# Patient Record
Sex: Male | Born: 1950
Health system: Southern US, Community
[De-identification: ages and names within clinical notes are randomized; demographics above are authoritative.]

## PROBLEM LIST (undated history)

## (undated) DIAGNOSIS — I10 Essential (primary) hypertension: Secondary | ICD-10-CM

## (undated) DIAGNOSIS — F03B2 Unspecified dementia, moderate, with psychotic disturbance: Secondary | ICD-10-CM

## (undated) DIAGNOSIS — Z87442 Personal history of urinary calculi: Secondary | ICD-10-CM

## (undated) DIAGNOSIS — G20 Parkinson's disease: Secondary | ICD-10-CM

## (undated) DIAGNOSIS — K219 Gastro-esophageal reflux disease without esophagitis: Secondary | ICD-10-CM

## (undated) DIAGNOSIS — F329 Major depressive disorder, single episode, unspecified: Secondary | ICD-10-CM

## (undated) DIAGNOSIS — I351 Nonrheumatic aortic (valve) insufficiency: Secondary | ICD-10-CM

## (undated) DIAGNOSIS — Z9689 Presence of other specified functional implants: Secondary | ICD-10-CM

## (undated) DIAGNOSIS — F4310 Post-traumatic stress disorder, unspecified: Secondary | ICD-10-CM

## (undated) DIAGNOSIS — G4730 Sleep apnea, unspecified: Secondary | ICD-10-CM

## (undated) DIAGNOSIS — Z950 Presence of cardiac pacemaker: Secondary | ICD-10-CM

## (undated) DIAGNOSIS — R001 Bradycardia, unspecified: Secondary | ICD-10-CM

## (undated) DIAGNOSIS — R51 Headache: Secondary | ICD-10-CM

## (undated) DIAGNOSIS — M1990 Unspecified osteoarthritis, unspecified site: Secondary | ICD-10-CM

## (undated) DIAGNOSIS — Z8619 Personal history of other infectious and parasitic diseases: Secondary | ICD-10-CM

## (undated) DIAGNOSIS — F419 Anxiety disorder, unspecified: Secondary | ICD-10-CM

## (undated) DIAGNOSIS — C801 Malignant (primary) neoplasm, unspecified: Secondary | ICD-10-CM

## (undated) DIAGNOSIS — I8390 Asymptomatic varicose veins of unspecified lower extremity: Secondary | ICD-10-CM

## (undated) DIAGNOSIS — G20A1 Parkinson's disease without dyskinesia, without mention of fluctuations: Secondary | ICD-10-CM

## (undated) DIAGNOSIS — I499 Cardiac arrhythmia, unspecified: Secondary | ICD-10-CM

## (undated) DIAGNOSIS — F32A Depression, unspecified: Secondary | ICD-10-CM

## (undated) DIAGNOSIS — IMO0001 Reserved for inherently not codable concepts without codable children: Secondary | ICD-10-CM

## (undated) DIAGNOSIS — Z8679 Personal history of other diseases of the circulatory system: Secondary | ICD-10-CM

## (undated) HISTORY — DX: Gastro-esophageal reflux disease without esophagitis: K21.9

## (undated) HISTORY — DX: Personal history of other infectious and parasitic diseases: Z86.19

## (undated) HISTORY — DX: Presence of cardiac pacemaker: Z95.0

## (undated) HISTORY — PX: OTHER SURGICAL HISTORY: SHX169

---

## 2011-04-19 DIAGNOSIS — C801 Malignant (primary) neoplasm, unspecified: Secondary | ICD-10-CM

## 2011-04-19 HISTORY — DX: Malignant (primary) neoplasm, unspecified: C80.1

## 2012-10-22 ENCOUNTER — Emergency Department: Payer: Self-pay | Admitting: Emergency Medicine

## 2012-10-23 LAB — COMPREHENSIVE METABOLIC PANEL
Alkaline Phosphatase: 80 U/L (ref 50–136)
BUN: 33 mg/dL — ABNORMAL HIGH (ref 7–18)
Calcium, Total: 8.6 mg/dL (ref 8.5–10.1)
Chloride: 107 mmol/L (ref 98–107)
EGFR (African American): >60
Glucose: 136 mg/dL — ABNORMAL HIGH (ref 65–99)
Osmolality: 291 (ref 275–301)
Potassium: 3.4 mmol/L — ABNORMAL LOW (ref 3.5–5.1)
SGOT(AST): 21 U/L (ref 15–37)

## 2012-10-23 LAB — CBC WITH DIFFERENTIAL/PLATELET
Basophil #: 0 10*3/uL (ref 0.0–0.1)
HCT: 37.3 % — ABNORMAL LOW (ref 40.0–52.0)
Lymphocyte #: 2.4 10*3/uL (ref 1.0–3.6)
MCH: 31.3 pg (ref 26.0–34.0)
MCHC: 34.4 g/dL (ref 32.0–36.0)
Monocyte #: 0.4 x10 3/mm (ref 0.2–1.0)
Monocyte %: 6.3 %
Neutrophil #: 4.1 10*3/uL (ref 1.4–6.5)
Neutrophil %: 58.5 %
Platelet: 176 10*3/uL (ref 150–440)
RDW: 14.1 % (ref 11.5–14.5)
WBC: 7.1 10*3/uL (ref 3.8–10.6)

## 2012-10-23 LAB — URINALYSIS, COMPLETE
Bacteria: NONE SEEN
Bilirubin,UR: NEGATIVE
Blood: NEGATIVE
Hyaline Cast: 1
Nitrite: NEGATIVE
Ph: 5 (ref 4.5–8.0)
Protein: NEGATIVE
WBC UR: 5 /HPF (ref 0–5)

## 2012-10-23 LAB — CK TOTAL AND CKMB (NOT AT ARMC)
CK, Total: 258 U/L — ABNORMAL HIGH (ref 35–232)
CK-MB: 4.7 ng/mL — ABNORMAL HIGH (ref 0.5–3.6)

## 2012-10-23 LAB — MAGNESIUM: Magnesium: 2 mg/dL

## 2012-10-23 LAB — TROPONIN I: Troponin-I: <0.02 ng/mL

## 2012-11-23 ENCOUNTER — Other Ambulatory Visit: Payer: Self-pay | Admitting: Neurosurgery

## 2012-12-06 ENCOUNTER — Encounter (HOSPITAL_COMMUNITY): Payer: Self-pay

## 2012-12-06 ENCOUNTER — Encounter (HOSPITAL_COMMUNITY)
Admission: RE | Admit: 2012-12-06 | Discharge: 2012-12-06 | Disposition: A | Discharge status: Home / Self Care | Payer: Medicare PPO | Source: Ambulatory Visit | Attending: Neurosurgery | Admitting: Neurosurgery

## 2012-12-06 DIAGNOSIS — Z01812 Encounter for preprocedural laboratory examination: Secondary | ICD-10-CM | POA: Insufficient documentation

## 2012-12-06 HISTORY — DX: Headache: R51

## 2012-12-06 HISTORY — DX: Post-traumatic stress disorder, unspecified: F43.10

## 2012-12-06 HISTORY — DX: Depression, unspecified: F32.A

## 2012-12-06 HISTORY — DX: Parkinson's disease without dyskinesia, without mention of fluctuations: G20.A1

## 2012-12-06 HISTORY — DX: Parkinson's disease: G20

## 2012-12-06 HISTORY — DX: Major depressive disorder, single episode, unspecified: F32.9

## 2012-12-06 HISTORY — DX: Sleep apnea, unspecified: G47.30

## 2012-12-06 HISTORY — DX: Bradycardia, unspecified: R00.1

## 2012-12-06 HISTORY — DX: Essential (primary) hypertension: I10

## 2012-12-06 HISTORY — DX: Asymptomatic varicose veins of unspecified lower extremity: I83.90

## 2012-12-06 HISTORY — DX: Malignant (primary) neoplasm, unspecified: C80.1

## 2012-12-06 HISTORY — DX: Unspecified osteoarthritis, unspecified site: M19.90

## 2012-12-06 LAB — CBC WITH DIFFERENTIAL/PLATELET
Basophils Relative: 1 % (ref 0–1)
Eosinophils Absolute: 0.1 10*3/uL (ref 0.0–0.7)
MCH: 31.7 pg (ref 26.0–34.0)
MCHC: 35.8 g/dL (ref 30.0–36.0)
Monocytes Relative: 9 % (ref 3–12)
Neutrophils Relative %: 61 % (ref 43–77)
Platelets: 197 10*3/uL (ref 150–400)
RDW: 13.3 % (ref 11.5–15.5)

## 2012-12-06 LAB — BASIC METABOLIC PANEL
BUN: 30 mg/dL — ABNORMAL HIGH (ref 6–23)
Calcium: 9.2 mg/dL (ref 8.4–10.5)
Creatinine, Ser: 0.66 mg/dL (ref 0.50–1.35)
GFR calc non Af Amer: >90 mL/min (ref >90)
Glucose, Bld: 103 mg/dL — ABNORMAL HIGH (ref 70–99)

## 2012-12-06 NOTE — Pre-Procedure Instructions (Signed)
Mikhi Athey Ohlsen  12/06/2012   Your procedure is scheduled on:  December 14, 2012 at 7:30 AM  Report to [facility] at 5:30 AM.  Call this number if you have problems the morning of surgery: [phone]   Remember:   Do not eat food or drink liquids after midnight.   Take these medicines the morning of surgery with A SIP OF WATER: Gabapentin, Percocet, Seligizine, Premapaxil, Carbidopa-Levadopa. Stop all Vitamins, Non-steroidals (Naproxen, Aleve, Motrin, Ibuprofen), herbal medications and aspirin.   Do not wear jewelry, make-up or nail polish.  Do not wear lotions, powders, or perfumes. You may wear deodorant.  Do not shave 48 hours prior to surgery. Men may shave face and neck.  Do not bring valuables to the hospital.  [facility] is not responsible                   for any belongings or valuables.  Contacts, dentures or bridgework may not be worn into surgery.  Leave suitcase in the car. After surgery it may be brought to your room.  For patients admitted to the hospital, checkout time is 11:00 AM the day of  discharge.     Special Instructions: Shower using CHG 2 nights before surgery and the night before surgery.  If you shower the day of surgery use CHG.  Use special wash - you have one bottle of CHG for all showers.  You should use approximately 1/3 of the bottle for each shower.   Please read over the following fact sheets that you were given: Pain Booklet, Coughing and Deep Breathing, MRSA Information and Surgical Site Infection Prevention

## 2012-12-10 ENCOUNTER — Encounter (HOSPITAL_COMMUNITY): Payer: Self-pay | Admitting: Pharmacist

## 2012-12-13 MED ORDER — CEFAZOLIN SODIUM-DEXTROSE 2-3 GM-% IV SOLR
2.0000 g | INTRAVENOUS | Status: AC
  Administered 2012-12-14: 2 g via INTRAVENOUS
  Filled 2012-12-13: qty 50

## 2012-12-14 ENCOUNTER — Encounter (HOSPITAL_COMMUNITY): Payer: Self-pay | Admitting: *Deleted

## 2012-12-14 ENCOUNTER — Ambulatory Visit (HOSPITAL_COMMUNITY)
Admission: RE | Admit: 2012-12-14 | Discharge: 2012-12-14 | Disposition: A | Discharge status: Home / Self Care | Payer: Medicare PPO | Source: Ambulatory Visit | Attending: Neurosurgery | Admitting: Neurosurgery

## 2012-12-14 ENCOUNTER — Encounter (HOSPITAL_COMMUNITY): Payer: Self-pay | Admitting: Certified Registered"

## 2012-12-14 ENCOUNTER — Ambulatory Visit (HOSPITAL_COMMUNITY): Payer: Medicare PPO | Admitting: Certified Registered"

## 2012-12-14 ENCOUNTER — Ambulatory Visit (HOSPITAL_COMMUNITY): Payer: Medicare PPO

## 2012-12-14 ENCOUNTER — Encounter (HOSPITAL_COMMUNITY)
Admission: RE | Disposition: A | Discharge status: Home / Self Care | Payer: Self-pay | Source: Ambulatory Visit | Attending: Neurosurgery

## 2012-12-14 DIAGNOSIS — I10 Essential (primary) hypertension: Secondary | ICD-10-CM | POA: Diagnosis not present

## 2012-12-14 DIAGNOSIS — M129 Arthropathy, unspecified: Secondary | ICD-10-CM | POA: Diagnosis not present

## 2012-12-14 DIAGNOSIS — N200 Calculus of kidney: Secondary | ICD-10-CM | POA: Insufficient documentation

## 2012-12-14 DIAGNOSIS — Z79899 Other long term (current) drug therapy: Secondary | ICD-10-CM | POA: Insufficient documentation

## 2012-12-14 DIAGNOSIS — M47817 Spondylosis without myelopathy or radiculopathy, lumbosacral region: Secondary | ICD-10-CM | POA: Insufficient documentation

## 2012-12-14 DIAGNOSIS — M48062 Spinal stenosis, lumbar region with neurogenic claudication: Secondary | ICD-10-CM | POA: Diagnosis present

## 2012-12-14 DIAGNOSIS — Z85828 Personal history of other malignant neoplasm of skin: Secondary | ICD-10-CM | POA: Diagnosis not present

## 2012-12-14 DIAGNOSIS — F4310 Post-traumatic stress disorder, unspecified: Secondary | ICD-10-CM | POA: Insufficient documentation

## 2012-12-14 DIAGNOSIS — I8390 Asymptomatic varicose veins of unspecified lower extremity: Secondary | ICD-10-CM | POA: Diagnosis not present

## 2012-12-14 DIAGNOSIS — G20 Parkinson's disease: Secondary | ICD-10-CM | POA: Diagnosis not present

## 2012-12-14 DIAGNOSIS — G20A1 Parkinson's disease without dyskinesia, without mention of fluctuations: Secondary | ICD-10-CM | POA: Insufficient documentation

## 2012-12-14 DIAGNOSIS — G4730 Sleep apnea, unspecified: Secondary | ICD-10-CM | POA: Diagnosis not present

## 2012-12-14 HISTORY — PX: LUMBAR LAMINECTOMY/DECOMPRESSION MICRODISCECTOMY: SHX5026

## 2012-12-14 SURGERY — LUMBAR LAMINECTOMY/DECOMPRESSION MICRODISCECTOMY 2 LEVELS
Anesthesia: General | Site: Back | Laterality: Bilateral | Wound class: Clean

## 2012-12-14 MED ORDER — GLYCOPYRROLATE 0.2 MG/ML IJ SOLN
INTRAMUSCULAR | Status: DC | PRN
  Administered 2012-12-14: .5 mg via INTRAVENOUS

## 2012-12-14 MED ORDER — ALUM & MAG HYDROXIDE-SIMETH 200-200-20 MG/5ML PO SUSP: 30.0000 mL | Freq: Four times a day (QID) | ORAL | Status: DC | PRN

## 2012-12-14 MED ORDER — MENTHOL 3 MG MT LOZG: 1.0000 | LOZENGE | OROMUCOSAL | Status: DC | PRN

## 2012-12-14 MED ORDER — LIDOCAINE HCL (CARDIAC) 20 MG/ML IV SOLN
INTRAVENOUS | Status: DC | PRN
  Administered 2012-12-14: 100 mg via INTRAVENOUS

## 2012-12-14 MED ORDER — HYDROMORPHONE HCL PF 1 MG/ML IJ SOLN: 0.5000 mg | INTRAMUSCULAR | Status: DC | PRN

## 2012-12-14 MED ORDER — MEPERIDINE HCL 25 MG/ML IJ SOLN: 6.2500 mg | INTRAMUSCULAR | Status: DC | PRN

## 2012-12-14 MED ORDER — KETOROLAC TROMETHAMINE 30 MG/ML IJ SOLN: 30.0000 mg | Freq: Once | INTRAMUSCULAR | Status: DC

## 2012-12-14 MED ORDER — LACTATED RINGERS IV SOLN
INTRAVENOUS | Status: DC | PRN
  Administered 2012-12-14 (×2): via INTRAVENOUS

## 2012-12-14 MED ORDER — OXYCODONE HCL 5 MG/5ML PO SOLN: 5.0000 mg | Freq: Once | ORAL | Status: DC | PRN

## 2012-12-14 MED ORDER — PHENOL 1.4 % MT LIQD: 1.0000 | OROMUCOSAL | Status: DC | PRN

## 2012-12-14 MED ORDER — GABAPENTIN 300 MG PO CAPS
600.0000 mg | ORAL_CAPSULE | Freq: Three times a day (TID) | ORAL | Status: DC
Start: 2012-12-14 — End: 2012-12-14
  Administered 2012-12-14: 600 mg via ORAL
  Filled 2012-12-14 (×2): qty 2

## 2012-12-14 MED ORDER — VITAMIN D3 25 MCG (1000 UNIT) PO TABS
1000.0000 [IU] | ORAL_TABLET | Freq: Every day | ORAL | Status: DC
Start: 2012-12-14 — End: 2012-12-14
  Administered 2012-12-14: 1000 [IU] via ORAL
  Filled 2012-12-14: qty 1

## 2012-12-14 MED ORDER — HYDROMORPHONE HCL PF 1 MG/ML IJ SOLN
INTRAMUSCULAR | Status: AC
  Filled 2012-12-14: qty 1

## 2012-12-14 MED ORDER — OXYCODONE HCL 5 MG PO TABS: 5.0000 mg | ORAL_TABLET | Freq: Once | ORAL | Status: DC | PRN

## 2012-12-14 MED ORDER — ACETAMINOPHEN 650 MG RE SUPP: 650.0000 mg | RECTAL | Status: DC | PRN

## 2012-12-14 MED ORDER — ACETAMINOPHEN 325 MG PO TABS: 650.0000 mg | ORAL_TABLET | ORAL | Status: DC | PRN

## 2012-12-14 MED ORDER — SODIUM CHLORIDE 0.9 % IJ SOLN: 3.0000 mL | INTRAMUSCULAR | Status: DC | PRN

## 2012-12-14 MED ORDER — BUPIVACAINE HCL (PF) 0.25 % IJ SOLN
INTRAMUSCULAR | Status: DC | PRN
  Administered 2012-12-14: 20 mL

## 2012-12-14 MED ORDER — CYCLOBENZAPRINE HCL 10 MG PO TABS: 10.0000 mg | ORAL_TABLET | Freq: Three times a day (TID) | ORAL | Status: DC | PRN

## 2012-12-14 MED ORDER — SODIUM CHLORIDE 0.9 % IR SOLN
Status: DC | PRN
  Administered 2012-12-14: 08:00:00

## 2012-12-14 MED ORDER — THROMBIN 5000 UNITS EX SOLR
CUTANEOUS | Status: DC | PRN
  Administered 2012-12-14 (×2): 5000 [IU] via TOPICAL

## 2012-12-14 MED ORDER — ONDANSETRON HCL 4 MG/2ML IJ SOLN: 4.0000 mg | INTRAMUSCULAR | Status: DC | PRN

## 2012-12-14 MED ORDER — SENNA 8.6 MG PO TABS: 1.0000 | ORAL_TABLET | Freq: Two times a day (BID) | ORAL | Status: DC

## 2012-12-14 MED ORDER — ZOLPIDEM TARTRATE 5 MG PO TABS: 5.0000 mg | ORAL_TABLET | Freq: Every evening | ORAL | Status: DC | PRN

## 2012-12-14 MED ORDER — CEFAZOLIN SODIUM 1-5 GM-% IV SOLN
1.0000 g | Freq: Three times a day (TID) | INTRAVENOUS | Status: DC
  Administered 2012-12-14: 1 g via INTRAVENOUS
  Filled 2012-12-14 (×2): qty 50

## 2012-12-14 MED ORDER — OXYCODONE-ACETAMINOPHEN 5-325 MG PO TABS
1.0000 | ORAL_TABLET | ORAL | Status: DC | PRN
  Administered 2012-12-14 (×2): 2 via ORAL
  Filled 2012-12-14 (×2): qty 2

## 2012-12-14 MED ORDER — KETOROLAC TROMETHAMINE 30 MG/ML IJ SOLN
INTRAMUSCULAR | Status: AC
  Filled 2012-12-14: qty 1

## 2012-12-14 MED ORDER — 0.9 % SODIUM CHLORIDE (POUR BTL) OPTIME
TOPICAL | Status: DC | PRN
  Administered 2012-12-14: 1000 mL

## 2012-12-14 MED ORDER — ONDANSETRON HCL 4 MG/2ML IJ SOLN
4.0000 mg | Freq: Once | INTRAMUSCULAR | Status: DC | PRN
Start: 2012-12-14 — End: 2012-12-14

## 2012-12-14 MED ORDER — PROPOFOL 10 MG/ML IV BOLUS
INTRAVENOUS | Status: DC | PRN
  Administered 2012-12-14: 100 mg via INTRAVENOUS

## 2012-12-14 MED ORDER — CARBIDOPA-LEVODOPA 25-100 MG PO TABS
3.0000 | ORAL_TABLET | Freq: Three times a day (TID) | ORAL | Status: DC
  Administered 2012-12-14: 3 via ORAL
  Filled 2012-12-14 (×3): qty 3

## 2012-12-14 MED ORDER — DEXAMETHASONE SODIUM PHOSPHATE 10 MG/ML IJ SOLN
INTRAMUSCULAR | Status: AC
  Filled 2012-12-14: qty 1

## 2012-12-14 MED ORDER — HEMOSTATIC AGENTS (NO CHARGE) OPTIME
TOPICAL | Status: DC | PRN
  Administered 2012-12-14: 1 via TOPICAL

## 2012-12-14 MED ORDER — CYCLOBENZAPRINE HCL 10 MG PO TABS
10.0000 mg | ORAL_TABLET | Freq: Three times a day (TID) | ORAL | Status: DC | PRN
  Administered 2012-12-14: 10 mg via ORAL
  Filled 2012-12-14: qty 1

## 2012-12-14 MED ORDER — ROCURONIUM BROMIDE 100 MG/10ML IV SOLN
INTRAVENOUS | Status: DC | PRN
  Administered 2012-12-14: 10 mg via INTRAVENOUS
  Administered 2012-12-14: 50 mg via INTRAVENOUS

## 2012-12-14 MED ORDER — SELEGILINE HCL 5 MG PO CAPS
5.0000 mg | ORAL_CAPSULE | Freq: Every day | ORAL | Status: DC
  Filled 2012-12-14: qty 1

## 2012-12-14 MED ORDER — SUFENTANIL CITRATE 50 MCG/ML IV SOLN
INTRAVENOUS | Status: DC | PRN
  Administered 2012-12-14: 10 ug via INTRAVENOUS
  Administered 2012-12-14: 20 ug via INTRAVENOUS

## 2012-12-14 MED ORDER — OXYCODONE-ACETAMINOPHEN 5-325 MG PO TABS: 2.0000 | ORAL_TABLET | Freq: Every day | ORAL | Status: DC

## 2012-12-14 MED ORDER — HYDROMORPHONE HCL PF 1 MG/ML IJ SOLN
0.2500 mg | INTRAMUSCULAR | Status: DC | PRN
  Administered 2012-12-14 (×3): 0.5 mg via INTRAVENOUS

## 2012-12-14 MED ORDER — DEXAMETHASONE SODIUM PHOSPHATE 10 MG/ML IJ SOLN
10.0000 mg | INTRAMUSCULAR | Status: AC
  Administered 2012-12-14: 10 mg via INTRAVENOUS

## 2012-12-14 MED ORDER — ONDANSETRON HCL 4 MG/2ML IJ SOLN
INTRAMUSCULAR | Status: DC | PRN
  Administered 2012-12-14: 4 mg via INTRAVENOUS

## 2012-12-14 MED ORDER — HYDROCODONE-ACETAMINOPHEN 5-325 MG PO TABS: 1.0000 | ORAL_TABLET | ORAL | Status: DC | PRN

## 2012-12-14 MED ORDER — HYDROCHLOROTHIAZIDE 25 MG PO TABS
25.0000 mg | ORAL_TABLET | Freq: Every day | ORAL | Status: DC
  Administered 2012-12-14: 25 mg via ORAL
  Filled 2012-12-14: qty 1

## 2012-12-14 MED ORDER — OXYCODONE-ACETAMINOPHEN 5-325 MG PO TABS: 1.0000 | ORAL_TABLET | Freq: Every day | ORAL | Status: DC

## 2012-12-14 MED ORDER — MIDAZOLAM HCL 5 MG/5ML IJ SOLN
INTRAMUSCULAR | Status: DC | PRN
  Administered 2012-12-14: 2 mg via INTRAVENOUS

## 2012-12-14 MED ORDER — KETOROLAC TROMETHAMINE 30 MG/ML IJ SOLN
30.0000 mg | Freq: Four times a day (QID) | INTRAMUSCULAR | Status: DC
  Administered 2012-12-14 (×2): 30 mg via INTRAVENOUS

## 2012-12-14 MED ORDER — SODIUM CHLORIDE 0.9 % IJ SOLN
3.0000 mL | Freq: Two times a day (BID) | INTRAMUSCULAR | Status: DC
  Administered 2012-12-14: 3 mL via INTRAVENOUS

## 2012-12-14 MED ORDER — PRAMIPEXOLE DIHYDROCHLORIDE 0.25 MG PO TABS
0.5000 mg | ORAL_TABLET | Freq: Three times a day (TID) | ORAL | Status: DC
  Administered 2012-12-14: 0.5 mg via ORAL
  Filled 2012-12-14 (×3): qty 2

## 2012-12-14 MED ORDER — NEOSTIGMINE METHYLSULFATE 1 MG/ML IJ SOLN
INTRAMUSCULAR | Status: DC | PRN
  Administered 2012-12-14: 3 mg via INTRAVENOUS

## 2012-12-14 SURGICAL SUPPLY — 54 items
BAG DECANTER FOR FLEXI CONT (MISCELLANEOUS) ×2 IMPLANT
BENZOIN TINCTURE PRP APPL 2/3 (GAUZE/BANDAGES/DRESSINGS) ×2 IMPLANT
BLADE SURG ROTATE 9660 (MISCELLANEOUS) IMPLANT
BRUSH SCRUB EZ PLAIN DRY (MISCELLANEOUS) ×2 IMPLANT
BUR CUTTER 7.0 ROUND (BURR) ×2 IMPLANT
CANISTER SUCTION 2500CC (MISCELLANEOUS) ×2 IMPLANT
CLOTH BEACON ORANGE TIMEOUT ST (SAFETY) ×2 IMPLANT
CONT SPEC 4OZ CLIKSEAL STRL BL (MISCELLANEOUS) ×2 IMPLANT
DECANTER SPIKE VIAL GLASS SM (MISCELLANEOUS) ×2 IMPLANT
DERMABOND ADVANCED (GAUZE/BANDAGES/DRESSINGS) ×1
DERMABOND ADVANCED .7 DNX12 (GAUZE/BANDAGES/DRESSINGS) ×1 IMPLANT
DRAPE LAPAROTOMY 100X72X124 (DRAPES) ×2 IMPLANT
DRAPE MICROSCOPE ZEISS OPMI (DRAPES) ×2 IMPLANT
DRAPE POUCH INSTRU U-SHP 10X18 (DRAPES) ×2 IMPLANT
DRAPE PROXIMA HALF (DRAPES) IMPLANT
DRAPE SURG 17X23 STRL (DRAPES) ×4 IMPLANT
DURAPREP 26ML APPLICATOR (WOUND CARE) ×2 IMPLANT
ELECT REM PT RETURN 9FT ADLT (ELECTROSURGICAL) ×2
ELECTRODE REM PT RTRN 9FT ADLT (ELECTROSURGICAL) ×1 IMPLANT
EVACUATOR 1/8 PVC DRAIN (DRAIN) ×2 IMPLANT
GAUZE SPONGE 4X4 16PLY XRAY LF (GAUZE/BANDAGES/DRESSINGS) IMPLANT
GLOVE BIOGEL PI IND STRL 7.0 (GLOVE) ×2 IMPLANT
GLOVE BIOGEL PI IND STRL 8 (GLOVE) ×1 IMPLANT
GLOVE BIOGEL PI INDICATOR 7.0 (GLOVE) ×2
GLOVE BIOGEL PI INDICATOR 8 (GLOVE) ×1
GLOVE ECLIPSE 7.5 STRL STRAW (GLOVE) ×2 IMPLANT
GLOVE ECLIPSE 8.5 STRL (GLOVE) ×4 IMPLANT
GLOVE EXAM NITRILE LRG STRL (GLOVE) IMPLANT
GLOVE EXAM NITRILE MD LF STRL (GLOVE) IMPLANT
GLOVE EXAM NITRILE XL STR (GLOVE) IMPLANT
GLOVE EXAM NITRILE XS STR PU (GLOVE) IMPLANT
GLOVE SS BIOGEL STRL SZ 6.5 (GLOVE) ×2 IMPLANT
GLOVE SUPERSENSE BIOGEL SZ 6.5 (GLOVE) ×2
GOWN BRE IMP SLV AUR LG STRL (GOWN DISPOSABLE) ×4 IMPLANT
GOWN BRE IMP SLV AUR XL STRL (GOWN DISPOSABLE) ×2 IMPLANT
GOWN STRL REIN 2XL LVL4 (GOWN DISPOSABLE) IMPLANT
KIT BASIN OR (CUSTOM PROCEDURE TRAY) ×2 IMPLANT
KIT ROOM TURNOVER OR (KITS) ×2 IMPLANT
NEEDLE HYPO 22GX1.5 SAFETY (NEEDLE) ×2 IMPLANT
NEEDLE SPNL 22GX3.5 QUINCKE BK (NEEDLE) ×2 IMPLANT
NS IRRIG 1000ML POUR BTL (IV SOLUTION) ×2 IMPLANT
PACK LAMINECTOMY NEURO (CUSTOM PROCEDURE TRAY) ×2 IMPLANT
PAD ARMBOARD 7.5X6 YLW CONV (MISCELLANEOUS) ×6 IMPLANT
RUBBERBAND STERILE (MISCELLANEOUS) ×4 IMPLANT
SPONGE GAUZE 4X4 12PLY (GAUZE/BANDAGES/DRESSINGS) ×2 IMPLANT
SPONGE SURGIFOAM ABS GEL SZ50 (HEMOSTASIS) ×2 IMPLANT
STRIP CLOSURE SKIN 1/2X4 (GAUZE/BANDAGES/DRESSINGS) ×2 IMPLANT
SUT VIC AB 2-0 CT1 18 (SUTURE) ×2 IMPLANT
SUT VIC AB 3-0 SH 8-18 (SUTURE) ×2 IMPLANT
SYR 20ML ECCENTRIC (SYRINGE) ×2 IMPLANT
TAPE CLOTH SURG 4X10 WHT LF (GAUZE/BANDAGES/DRESSINGS) ×2 IMPLANT
TOWEL OR 17X24 6PK STRL BLUE (TOWEL DISPOSABLE) ×2 IMPLANT
TOWEL OR 17X26 10 PK STRL BLUE (TOWEL DISPOSABLE) ×2 IMPLANT
WATER STERILE IRR 1000ML POUR (IV SOLUTION) ×2 IMPLANT

## 2012-12-14 NOTE — Op Note (Signed)
Date of procedure: 12/14/2012  Date of dictation: Same  Service: Neurosurgery  Preoperative diagnosis: L3-4, L4-5 stenosis with neurogenic claudication affecting the bilateral L3-L4 and L5 nerve roots.  Postoperative diagnosis: Same  Procedure Name: Bilateral L3-4 and L4-5 decompressive laminotomies with bilateral L3, L4, L5 decompressive foraminotomies.  Surgeon:Jessye Imhoff A.Jayce Kainz, M.D.  Asst. Surgeon: Phoebe Perch  Anesthesia: General  Indication: 62 year old male with severe lumbar stenosis and bilateral lower extremity symptoms consistent with neurogenic claudication. Workup demonstrates evidence of marked spondylosis and stenosis at L3-4 L4-5 with severe compression of the thecal sac and exiting L3-L4 and L5 nerve roots. Patient presents now for bilateral decompressive surgery in hopes of improving his symptoms.  Operative note: After induction anesthesia, patient positioned prone onto Wilson frame and appropriately padded. Lumbar region prepped and draped. Incision made overlying the L3-4-5 level. Dissection performed bilaterally. Retractor placed. X-ray taken. Levels confirmed. Laminotomies performed using high-speed drill and Kerrison rongeurs at L3-4 L4-5 bilaterally. Underlying ligament flavum was elevated and resected piecemeal fashion. Wide decompressive laminotomies and foraminotomies along the course the exiting L3-L4 and L5 nerve roots were performed bilaterally. At this point a very thorough decompression achieved. The disc spaces were inspected at both levels bilaterally and found to be without significant herniation. Wounds that area doing a bike solution. Gelfoam was placed topically for hemostasis. Medium Hemovac drain was left in the epidural space. Wounds and close in layers with Vicryl sutures. Steri-Strips and sterile dressing were applied. There were no apparent complications. Patient tolerated the procedure well and he returns to the recovery postop.

## 2012-12-14 NOTE — Brief Op Note (Signed)
12/14/2012  9:47 AM  PATIENT:  [name]  63 y.o. male  PRE-OPERATIVE DIAGNOSIS:  stenosis  POST-OPERATIVE DIAGNOSIS:  stenosis  PROCEDURE:  Procedure(s): Bilateral lumbar three-four, four-five decompressive laminotomy/foraminotomy (Bilateral)  SURGEON:  Surgeon(s) and Role:    * Temple Pacini, MD - Primary    * Clydene Fake, MD - Assisting  PHYSICIAN ASSISTANT:   ASSISTANTS:    ANESTHESIA:   general  EBL:  Total I/O In: 1400 [I.V.:1400] Out: 50 [Blood:50]  BLOOD ADMINISTERED:none  DRAINS: (Medium) Hemovact drain(s) in the Epidural space with  Suction Open   LOCAL MEDICATIONS USED:  MARCAINE     SPECIMEN:  No Specimen  DISPOSITION OF SPECIMEN:  N/A  COUNTS:  YES  TOURNIQUET:  * No tourniquets in log *  DICTATION: .Dragon Dictation  PLAN OF CARE: Admit for overnight observation  PATIENT DISPOSITION:  PACU - hemodynamically stable.   Delay start of Pharmacological VTE agent (>24hrs) due to surgical blood loss or risk of bleeding: yes

## 2012-12-14 NOTE — Transfer of Care (Signed)
Immediate Anesthesia Transfer of Care Note  Patient: [name]  Procedure(s) Performed: Procedure(s): Bilateral lumbar three-four, four-five decompressive laminotomy/foraminotomy (Bilateral)  Patient Location: PACU  Anesthesia Type:General  Level of Consciousness: awake  Airway & Oxygen Therapy: Patient Spontanous Breathing and Patient connected to nasal cannula oxygen  Post-op Assessment: Report given to PACU RN, Post -op Vital signs reviewed and stable and Patient moving all extremities  Post vital signs: Reviewed and stable  Complications: No apparent anesthesia complications

## 2012-12-14 NOTE — Progress Notes (Signed)
Pt. Alert and oriented,follows simple instructions, denies pain. Incision area without swelling, redness or S/S of infection. Voiding adequate clear yellow urine. Moving all extremities well and vitals stable and documented. Lumbar surgery notes instructions given to patient and family member for home safety and precautions. Pt. and family stated understanding of instructions given. Extra dressing supplies given for home use

## 2012-12-14 NOTE — Anesthesia Postprocedure Evaluation (Signed)
Anesthesia Post Note  Patient: [name]  Procedure(s) Performed: Procedure(s) (LRB): Bilateral lumbar three-four, four-five decompressive laminotomy/foraminotomy (Bilateral)  Anesthesia type: general  Patient location: PACU  Post pain: Pain level controlled  Post assessment: Patient's Cardiovascular Status Stable  Last Vitals:  Filed Vitals:   12/14/12 1144  BP: 100/51  Pulse: 46  Temp: 36.6 C  Resp: 18    Post vital signs: Reviewed and stable  Level of consciousness: sedated  Complications: No apparent anesthesia complications

## 2012-12-14 NOTE — Discharge Summary (Signed)
Physician Discharge Summary  Patient ID: [name] MRN: [id number] DOB/AGE: 17-Nov-1950 62 y.o.  Admit date: 12/14/2012 Discharge date: 12/14/2012  Admission Diagnoses:  Discharge Diagnoses:  Principal Problem:   Spinal stenosis, lumbar region, with neurogenic claudication Active Problems:   Parkinson disease   Discharged Condition: good  Hospital Course: Patient admitted to the hospital where he underwent uncomplicated bilateral lumbar decompression with foraminotomies. Postoperatively he is doing well. Back and lower extremity pain much better. Up ambulating well. Ready for discharge home. Consults:   Significant Diagnostic Studies:   Treatments:   Discharge Exam: Blood pressure 123/73, pulse 47, temperature 98 F (36.7 C), temperature source Oral, resp. rate 18, SpO2 97.00%. Awake and alert. Oriented and appropriate. Cranial nerve function intact. Motor and sensory function of the extremities intact. Wound clean and dry. Chest and abdomen benign.  Disposition: Final discharge disposition not confirmed     Medication List         carbidopa-levodopa 25-100 MG per tablet  Commonly known as:  SINEMET IR  Take 3 tablets by mouth 3 (three) times daily.     cholecalciferol 1000 UNITS tablet  Commonly known as:  VITAMIN D  Take 1,000 Units by mouth daily.     cyclobenzaprine 10 MG tablet  Commonly known as:  FLEXERIL  Take 1 tablet (10 mg total) by mouth 3 (three) times daily as needed for muscle spasms.     gabapentin 300 MG capsule  Commonly known as:  NEURONTIN  Take 600 mg by mouth 3 (three) times daily.     hydrochlorothiazide 25 MG tablet  Commonly known as:  HYDRODIURIL  Take 25 mg by mouth daily.     HYDROcodone-acetaminophen 5-325 MG per tablet  Commonly known as:  NORCO/VICODIN  Take 1-2 tablets by mouth every 4 (four) hours as needed.     naproxen 500 MG tablet  Commonly known as:  NAPROSYN  Take 500 mg by mouth 2 (two) times daily with a meal.      oxyCODONE-acetaminophen 5-325 MG per tablet  Commonly known as:  PERCOCET/ROXICET  Take 1-2 tablets by mouth 3 (three) times daily. 1 tablet in the morning and evening and 2 at lunch     pramipexole 0.5 MG tablet  Commonly known as:  MIRAPEX  Take 0.5 mg by mouth 3 (three) times daily.     selegiline 5 MG capsule  Commonly known as:  ELDEPRYL  Take 5 mg by mouth daily.     STYE OP  Apply 1 application to eye 2 (two) times daily as needed (for styes).     tetracaine 2 % solution  Use as directed in the mouth or throat.     tetrahydrozoline 0.05 % ophthalmic solution  Place 1 drop into both eyes 2 (two) times daily as needed (for itchy eyes).           Follow-up Information   Follow up with Temple Pacini, MD.   Specialty:  Neurosurgery   Contact information:   [address] [place] [address] [phone]       Signed: Temple Pacini 12/14/2012, 5:21 PM

## 2012-12-14 NOTE — H&P (Cosign Needed)
[name] is an 62 y.o. male.   Chief Complaint: Bilateral leg pain HPI: 62 year old male with history of lumbar pain with radiation to both lower extremities consistent with neurogenic claudication. Workup demonstrates evidence of marked stenosis at L3-4 and L4-5. Patient's failed conservative management and presents now for decompressive surgery.  Past Medical History  Diagnosis Date  . Parkinson's disease     dx'ed 15 years ago  . PTSD (post-traumatic stress disorder)   . Bradycardia   . Hypertension     treated with HCTZ  . Depression   . Sleep apnea     doesn't use C-pap  . Varicose veins   . Kidney stone     via xray  . Headache(784.0)     tension headaches  . Neuromuscular disorder     parkinsons disease, neuropathy  . Cancer 2013    skin cancer  . Arthritis     Past Surgical History  Procedure Laterality Date  . Cyst removed       from lip as a child  . Skin cancer removed      from ears    History reviewed. No pertinent family history. Social History:  reports that he has never smoked. He has quit using smokeless tobacco. He reports that  drinks alcohol. He reports that he does not use illicit drugs.  Allergies: No Known Allergies  Medications Prior to Admission  Medication Sig Dispense Refill  . carbidopa-levodopa (SINEMET IR) 25-100 MG per tablet Take 3 tablets by mouth 3 (three) times daily.      . cholecalciferol (VITAMIN D) 1000 UNITS tablet Take 1,000 Units by mouth daily.      Marland Kitchen gabapentin (NEURONTIN) 300 MG capsule Take 600 mg by mouth 3 (three) times daily.      . hydrochlorothiazide (HYDRODIURIL) 25 MG tablet Take 25 mg by mouth daily.      . naproxen (NAPROSYN) 500 MG tablet Take 500 mg by mouth 2 (two) times daily with a meal.      . oxyCODONE-acetaminophen (PERCOCET/ROXICET) 5-325 MG per tablet Take 1-2 tablets by mouth 3 (three) times daily. 1 tablet in the morning and evening and 2 at lunch      . pramipexole (MIRAPEX) 0.5 MG tablet Take  0.5 mg by mouth 3 (three) times daily.      . selegiline (ELDEPRYL) 5 MG capsule Take 5 mg by mouth daily.      Marland Kitchen tetrahydrozoline 0.05 % ophthalmic solution Place 1 drop into both eyes 2 (two) times daily as needed (for itchy eyes).      Marland Kitchen tetracaine 2 % solution Use as directed in the mouth or throat.      Cliffton Asters Petrolatum-Mineral Oil (STYE OP) Apply 1 application to eye 2 (two) times daily as needed (for styes).        No results found for this or any previous visit (from the past 48 hour(s)). No results found.  A comprehensive review of systems was negative.  Blood pressure 135/93, pulse 47, temperature 97.4 F (36.3 C), temperature source Oral, resp. rate 18, SpO2 100.00%.  Patient is awake and alert. He is oriented and appropriate. Cranial nerve function is intact. Motor examination is intact or sensory examination is nonfocal. Deep tendon reflexes are hypoactive throughout. Patient is resting tremor. Examination head ears nose or throat is unremarkable. Chest and abdomen are benign. Extremities are free from injury or deformity. Assessment/Plan Lumbar stenosis with neurogenic claudication at L3-4 L4-5. Plan bilateral L3-4 and  L4-5 decompressive laminotomies and foraminotomies. Risks and benefits been explained. Patient wishes to proceed.  Aricka Goldberger A 12/14/2012, 7:44 AM

## 2012-12-14 NOTE — Anesthesia Procedure Notes (Signed)
Procedure Name: Intubation Date/Time: 12/14/2012 7:54 AM Performed by: Charm Barges, Chaeli Judy R Pre-anesthesia Checklist: Patient identified, Emergency Drugs available, Suction available, Patient being monitored and Timeout performed Patient Re-evaluated:Patient Re-evaluated prior to inductionOxygen Delivery Method: Circle system utilized Preoxygenation: Pre-oxygenation with 100% oxygen Intubation Type: IV induction Ventilation: Mask ventilation without difficulty Laryngoscope Size: Mac and 4 Grade View: Grade I Tube type: Oral Tube size: 8.0 mm Number of attempts: 1 Airway Equipment and Method: Stylet Placement Confirmation: ETT inserted through vocal cords under direct vision,  positive ETCO2 and breath sounds checked- equal and bilateral Secured at: 21 cm Tube secured with: Tape Dental Injury: Teeth and Oropharynx as per pre-operative assessment

## 2012-12-14 NOTE — Preoperative (Signed)
Beta Blockers   Reason not to administer Beta Blockers:Not Applicable 

## 2012-12-14 NOTE — Anesthesia Preprocedure Evaluation (Addendum)
Anesthesia Evaluation  Patient identified by MRN, date of birth, ID band Patient awake    Reviewed: Allergy & Precautions, H&P , NPO status , Patient's Chart, lab work & pertinent test results  Airway Mallampati: II TM Distance: >3 FB Neck ROM: Full    Dental  (+) Teeth Intact and Dental Advisory Given   Pulmonary sleep apnea ,          Cardiovascular hypertension, Pt. on medications     Neuro/Psych H/O Parkinsons Disease    GI/Hepatic   Endo/Other    Renal/GU      Musculoskeletal   Abdominal   Peds  Hematology   Anesthesia Other Findings   Reproductive/Obstetrics                         Anesthesia Physical Anesthesia Plan  ASA: II  Anesthesia Plan: General   Post-op Pain Management:    Induction: Intravenous  Airway Management Planned: Oral ETT  Additional Equipment:   Intra-op Plan:   Post-operative Plan: Extubation in OR  Informed Consent: I have reviewed the patients History and Physical, chart, labs and discussed the procedure including the risks, benefits and alternatives for the proposed anesthesia with the patient or authorized representative who has indicated his/her understanding and acceptance.   Dental advisory given  Plan Discussed with: CRNA, Surgeon and Anesthesiologist  Anesthesia Plan Comments:        Anesthesia Quick Evaluation

## 2012-12-14 NOTE — Plan of Care (Signed)
Problem: Consults Goal: Diagnosis - Spinal Surgery Outcome: Completed/Met Date Met:  12/14/12 Lumbar Laminectomy (Complex) and Foraminotomy

## 2012-12-18 ENCOUNTER — Encounter (HOSPITAL_COMMUNITY): Payer: Self-pay | Admitting: Neurosurgery

## 2013-03-08 ENCOUNTER — Other Ambulatory Visit: Payer: Self-pay

## 2013-03-12 ENCOUNTER — Ambulatory Visit (INDEPENDENT_AMBULATORY_CARE_PROVIDER_SITE_OTHER): Payer: Medicare PPO | Admitting: Neurology

## 2013-03-12 ENCOUNTER — Encounter: Payer: Self-pay | Admitting: Neurology

## 2013-03-12 VITALS — BP 122/68 | HR 48 | Temp 97.8°F | Resp 12 | Ht 69.0 in | Wt 175.7 lb

## 2013-03-12 DIAGNOSIS — G4733 Obstructive sleep apnea (adult) (pediatric): Secondary | ICD-10-CM

## 2013-03-12 DIAGNOSIS — G20 Parkinson's disease: Secondary | ICD-10-CM

## 2013-03-12 NOTE — Progress Notes (Signed)
[name] was seen today in the movement disorders clinic for neurologic consultation at the request of Dr. Jordan Likes. His PCP is at the [facility] in [place].  The consultation is for the evaluation of Parkinson's disease.  The patient is accompanied by his wife who supplements the history.  The patient previously saw Dr. Jerold Coombe at [facility] and I do have several of those records.  Patient was diagnosed with Parkinson's disease about 15 years ago, at the age of 51-88 years old.  He states that the first sx was not swinging of the R arm and some minor balance changes.  Balance has gotten worse with time.   Specific Symptoms:  Tremor: yes, R arm and leg Voice: pt denies but wife states that he mumbles and is hypophonic Sleep: some trouble staying asleep.  Easily dozes anywhere, has sleep apnea but had trouble with mask  Vivid Dreams:  yes  Acting out dreams:  yes Wet Pillows: no Postural symptoms:  yes  Falls?  yes (falls back when squats down, last fall after back surgery in September) Bradykinesia symptoms: slow movements, slurred or difficult speech and difficulty regaining balance Loss of smell:  yes Loss of taste:  yes Urinary Incontinence:  no Difficulty Swallowing:  yes (solids) Handwriting, micrographia: yes Trouble with ADL's:  no  Trouble buttoning clothing: yes Depression:  yes (some, due to stress of children who live with them, and they are 32 and 63 y/o.  Just married 3 months ago) Memory changes:  yes (fairly minor; able to drive, cook) Hallucinations:  no  visual distortions: yes N/V:  no Lightheaded:  yes (only occasionally if stands up too fast)  Syncope: yes (? If passed out one year ago) Diplopia:  yes (intermittent x 1 year, horizontal, monocular) Dyskinesia:  yes  The patient is currently on carbidopa/levodopa 25/100, 3 at 9am, 2 at 1, 3 at 5pm, 2 at 9 pm and 1 carbidopa/levodopa 25/100 CR at bedtime.  He is also on Mirapex 0.5 mg 3 times a day and selegiline 5  mg daily.  Because of dyskinesia, Dr. Lorin Picket did talk to him about DBS, but according to Dr. Roby Lofts notes, the patient was skeptical.  She did have a neuropsych evaluation at the [facility] in [place], but it appears that this was more of counseling and no testing.  He did have a nocturnal polysomnogram on 06/20/2010 demonstrating an apnea hypopnea index of 16.  Neuroimaging has  previously been performed.  It is not available for my review today.  PREVIOUS MEDICATIONS: Sinemet, Sinemet CR, Mirapex and Eldpryl  ALLERGIES:  No Known Allergies  CURRENT MEDICATIONS:    Medication List       This list is accurate as of: 03/12/13 11:23 AM.  Always use your most recent med list.               carbidopa-levodopa 25-100 MG per tablet  Commonly known as:  SINEMET IR  Take by mouth. 3 at 9am, 2 at 1 pm, 3 at 5pm, 2 at 9pm     Carbidopa-Levodopa ER 25-100 MG tablet controlled release  Commonly known as:  SINEMET CR  Take 1 tablet by mouth at bedtime.     cholecalciferol 1000 UNITS tablet  Commonly known as:  VITAMIN D  Take 1,000 Units by mouth daily.     gabapentin 300 MG capsule  Commonly known as:  NEURONTIN  Take 600 mg by mouth 3 (three) times daily.  hydrochlorothiazide 25 MG tablet  Commonly known as:  HYDRODIURIL  Take 25 mg by mouth daily.     pramipexole 0.5 MG tablet  Commonly known as:  MIRAPEX  Take 0.5 mg by mouth 3 (three) times daily.     selegiline 5 MG capsule  Commonly known as:  ELDEPRYL  Take 5 mg by mouth daily.     STYE OP  Apply 1 application to eye 2 (two) times daily as needed (for styes).     tetrahydrozoline 0.05 % ophthalmic solution  Place 1 drop into both eyes 2 (two) times daily as needed (for itchy eyes).         PAST MEDICAL HISTORY:   Past Medical History  Diagnosis Date  . Parkinson's disease     dx'ed 15 years ago  . PTSD (post-traumatic stress disorder)   . Bradycardia   . Hypertension     treated with HCTZ  . Depression   .  Sleep apnea     doesn't use C-pap  . Varicose veins   . Kidney stone     via xray  . Headache(784.0)     tension headaches  . Cancer 2013    skin cancer  . Arthritis     PAST SURGICAL HISTORY:   Past Surgical History  Procedure Laterality Date  . Cyst removed       from lip as a child  . Skin cancer removed      from ears  . Lumbar laminectomy/decompression microdiscectomy Bilateral 12/14/2012    Procedure: Bilateral lumbar three-four, four-five decompressive laminotomy/foraminotomy;  Surgeon: Temple Pacini, MD;  Location: [facility];  Service: Neurosurgery;  Laterality: Bilateral;    SOCIAL HISTORY:   History   Social History  . Marital Status: Married    Spouse Name: N/A    Number of Children: N/A  . Years of Education: N/A   Occupational History  . disabled     2001, [employer]  .      search and rescue helicopter   Social History Main Topics  . Smoking status: Never Smoker   . Smokeless tobacco: Former Neurosurgeon  . Alcohol Use: Yes     Comment: occasional (twice per month)  . Drug Use: No  . Sexual Activity: Not on file   Other Topics Concern  . Not on file   Social History Narrative  . No narrative on file    FAMILY HISTORY:   Family Status  Relation Status Death Age  . Mother Alive     Arthritis, unknown medical hx  . Father Deceased     Lung Cancer  . Sister Alive     1 and one half sister  . Brother Alive   . Son Alive   . Daughter Alive     2    ROS:  A complete 10 system review of systems was obtained and was unremarkable apart from what is mentioned above.  PHYSICAL EXAMINATION:    VITALS:   Filed Vitals:   03/12/13 0835  BP: 122/68  Pulse: 48  Temp: 97.8 F (36.6 C)  Resp: 12  Height: 5\' 9"  (1.753 m)  Weight: 175 lb 11.2 oz (79.697 kg)    GEN:  The patient appears stated age and is in NAD. HEENT:  Normocephalic, atraumatic.  The mucous membranes are moist. The superficial temporal arteries are without ropiness or tenderness. CV:   RRR Lungs:  CTAB Neck/HEME:  There are no carotid bruits bilaterally.  Neurological examination:  Orientation: The patient is alert and oriented x3. Fund of knowledge is appropriate.  Recent and remote memory are intact.  Attention and concentration are normal.    Able to name objects and repeat phrases. Cranial nerves: There is good facial symmetry. Pupils are equal round and reactive to light bilaterally. Fundoscopic exam reveals clear margins bilaterally. Extraocular muscles are intact. The visual fields are full to confrontational testing. The speech is fluent but mildly dysarthric and hypophonic. Soft palate rises symmetrically and there is no tongue deviation. Hearing is intact to conversational tone. Sensation: Sensation is intact to light and pinprick throughout (facial, trunk, extremities). Vibration is intact at the bilateral big toe. There is no extinction with double simultaneous stimulation. There is no sensory dermatomal level identified. Motor: Strength is 5/5 in the bilateral upper and lower extremities.   Shoulder shrug is equal and symmetric.  There is no pronator drift. Deep tendon reflexes: Deep tendon reflexes are 2/4 at the bilateral biceps, triceps, brachioradialis, patella and trace at the bilateral achilles. Plantar responses are downgoing bilaterally.  Movement examination: Tone: There is mild increased tone in the right upper extremity, only noted when he does activation procedures.  Tone in the left upper extremity and bilateral lower extremities is normal.  Abnormal movements: I saw no tremor or dyskinesia today. Coordination:  There is very minimal decremation with RAM's, seen primarily in the right upper extremity with finger taps and alternation of supination/pronation of the forearm. Gait and Station: The patient has no difficulty arising out of a deep-seated chair without the use of the hands. The patient's stride length is just mildly decreased.  Armswing is  actually fairly good today.  The patient has a negative pull test.      ASSESSMENT/PLAN:  1.  Parkinsons disease by hx. He has been seeing Dr. Jerold Coombe through the [facility].  He would like to transfer care.  -He really is near optimally  treated.  He is currently on 1100 mg of levodopa per day.  He describes motor fluctuations, including dyskinesia with medication, but really admits that he has been much better since he got married a year ago and his wife insisted that he use a phone alarm to regulate the timing of his medication.  -He will remain on pramipexole, 0.5 mg 3 times per day as well as the selegiline 5 mg in the morning.  Risks, benefits, side effects and alternative therapies were discussed.  The opportunity to ask questions was given and they were answered to the best of my ability.  The patient expressed understanding and willingness to follow the outlined treatment protocols.  -The patient asked multiple questions about DBS therapy and I spent greater than 50% of the visit in counseling.  We talked about the procedure itself as well as the presurgical workup to determine candidacy.  We talked about risks and benefits.  We talked about the things that helps, as well as the things it does not.  Right now, he does not think that he is interested.  I did give him a patient DVD about the procedure.  -I. did encourage the patient to exercise.  We talked about the [facility] clinic exercise study.  -He is currently getting labs through the [facility], which is very difficult to retrieve information from.  If I do not have new labs by next visit, then I will order them. 2.  LBP.   -This has essentially resolved status post lumbar laminectomy in aug,  2014. 3.  OSAS  -We talked about morbidity and mortality associated with untreated sleep apnea.  This probably contributes to EDS.  He was not able to tolerate the cpap despite trying multiple masks. 4.  I will plan on  seeing the patient back in 4 months, sooner should new neurologic issues arise.

## 2013-03-12 NOTE — Patient Instructions (Signed)
1.  Let me know if you decide you would like [place] to do the testing to be considered for the DBS surgery 2.  Please try to get me a copy of [facility].    If I don't have new labs next visit, I will order next visit 3.  Exercise!!!

## 2013-03-31 ENCOUNTER — Emergency Department: Payer: Self-pay | Admitting: Emergency Medicine

## 2013-05-29 ENCOUNTER — Emergency Department: Payer: Self-pay | Admitting: Emergency Medicine

## 2013-05-29 LAB — CBC WITH DIFFERENTIAL/PLATELET
BASOS PCT: 0.5 %
Basophil #: 0 10*3/uL (ref 0.0–0.1)
EOS ABS: 0.1 10*3/uL (ref 0.0–0.7)
Eosinophil %: 1.6 %
HCT: 40.8 % (ref 40.0–52.0)
HGB: 14.1 g/dL (ref 13.0–18.0)
Lymphocyte #: 1.1 10*3/uL (ref 1.0–3.6)
Lymphocyte %: 16.5 %
MCH: 30.4 pg (ref 26.0–34.0)
MCHC: 34.4 g/dL (ref 32.0–36.0)
MCV: 88 fL (ref 80–100)
MONOS PCT: 9.5 %
Monocyte #: 0.6 x10 3/mm (ref 0.2–1.0)
NEUTROS PCT: 71.9 %
Neutrophil #: 4.6 10*3/uL (ref 1.4–6.5)
Platelet: 142 10*3/uL — ABNORMAL LOW (ref 150–440)
RBC: 4.62 10*6/uL (ref 4.40–5.90)
RDW: 14.4 % (ref 11.5–14.5)
WBC: 6.4 10*3/uL (ref 3.8–10.6)

## 2013-05-29 LAB — COMPREHENSIVE METABOLIC PANEL
ANION GAP: 5 — AB (ref 7–16)
Albumin: 3.6 g/dL (ref 3.4–5.0)
Alkaline Phosphatase: 85 U/L
BUN: 16 mg/dL (ref 7–18)
Bilirubin,Total: 0.5 mg/dL (ref 0.2–1.0)
Calcium, Total: 8.8 mg/dL (ref 8.5–10.1)
Chloride: 102 mmol/L (ref 98–107)
Co2: 29 mmol/L (ref 21–32)
Creatinine: 0.86 mg/dL (ref 0.60–1.30)
EGFR (Non-African Amer.): >60
GLUCOSE: 134 mg/dL — AB (ref 65–99)
OSMOLALITY: 275 (ref 275–301)
Potassium: 3.7 mmol/L (ref 3.5–5.1)
SGOT(AST): 18 U/L (ref 15–37)
SGPT (ALT): 26 U/L (ref 12–78)
SODIUM: 136 mmol/L (ref 136–145)
Total Protein: 7.3 g/dL (ref 6.4–8.2)

## 2013-05-29 LAB — RAPID INFLUENZA A&B ANTIGENS

## 2013-05-31 LAB — BETA STREP CULTURE(ARMC)

## 2013-06-03 ENCOUNTER — Encounter: Payer: Self-pay | Admitting: Cardiovascular Disease

## 2013-06-03 ENCOUNTER — Ambulatory Visit (INDEPENDENT_AMBULATORY_CARE_PROVIDER_SITE_OTHER): Payer: Medicare PPO | Admitting: Cardiovascular Disease

## 2013-06-03 VITALS — BP 120/72 | HR 40 | Ht 69.0 in | Wt 176.0 lb

## 2013-06-03 DIAGNOSIS — M7989 Other specified soft tissue disorders: Secondary | ICD-10-CM | POA: Insufficient documentation

## 2013-06-03 DIAGNOSIS — I10 Essential (primary) hypertension: Secondary | ICD-10-CM | POA: Insufficient documentation

## 2013-06-03 DIAGNOSIS — M48062 Spinal stenosis, lumbar region with neurogenic claudication: Secondary | ICD-10-CM

## 2013-06-03 DIAGNOSIS — I498 Other specified cardiac arrhythmias: Secondary | ICD-10-CM

## 2013-06-03 DIAGNOSIS — R0602 Shortness of breath: Secondary | ICD-10-CM

## 2013-06-03 DIAGNOSIS — R001 Bradycardia, unspecified: Secondary | ICD-10-CM | POA: Insufficient documentation

## 2013-06-03 DIAGNOSIS — G20 Parkinson's disease: Secondary | ICD-10-CM

## 2013-06-03 DIAGNOSIS — E785 Hyperlipidemia, unspecified: Secondary | ICD-10-CM

## 2013-06-03 NOTE — Assessment & Plan Note (Signed)
Calf on the right is larger than the left. I do not think this is a sign of anything pathologic. He reports ultrasound last month showing no DVT. I suspect this is secondary to overuse of the right leg and right arm compared to the left side of his body. Presentation  not consistent with DVT.

## 2013-06-03 NOTE — Assessment & Plan Note (Signed)
Suggested he closely monitor his blood pressure. If this runs low, could cut the HCTZ in half

## 2013-06-03 NOTE — Patient Instructions (Addendum)
You are doing well. No medication changes were made.  We will check blood work today  Please call us if you have new issues that need to be addressed before your next appt.  Your physician wants you to follow-up in: 12 months.  You will receive a reminder letter in the mail two months in advance. If you don't receive a letter, please call our office to schedule the follow-up appointment.

## 2013-06-03 NOTE — Assessment & Plan Note (Signed)
Stable symptoms. Followed by Dr. Annette Stable.

## 2013-06-03 NOTE — Assessment & Plan Note (Signed)
Followed by neurology. Symptoms for more than 10 years.

## 2013-06-03 NOTE — Assessment & Plan Note (Signed)
He is relatively asymptomatic. 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 No indication for pacemaker as he is asymptomatic

## 2013-06-03 NOTE — Progress Notes (Signed)
Patient ID: [name], male    DOB: 1951-04-14, 63 y.o.   MRN: [id number]  HPI Comments: [name] is a pleasant 63 year old gentleman with Parkinson's for more than 10 years, hypertension, chronic bradycardia who presents to establish care in the clinic. He receives most of his primary care followup at the [facility] Notes from there indicate history of hyperlipidemia, depression, Parkinson's, possible seizure disorder He has a history of chronic low back pain, lumbar laminectomy 12/14/2012 by Dr. Annette Stable in [place], rarely takes pain medication He also reports having a history of spinal stenosis or  He presents with his wife. He reports having right lower extremity swelling for many months. He had a ultrasound to rule out DVT at [facility] last month and was told that he had no DVT. He is uncertain why his right calf is much bigger than his left calf. Also reports having a larger right forearm and arm in general compared to his left arm. He is right-handed. He does report that he walks with a limp from his Parkinson's and perhaps he is using his right side more than his left  He reports that heart rate has been running low for many years and he is relatively asymptomatic. Denies any episodes of lightheadedness or dizziness. Blood pressure typically runs 086 systolic on HCTZ  EKG shows sinus bradycardia with rate 40 beats per minute, no significant ST or T wave changes  Recent CT scan of the head 05/29/2013 showing mild chronic inflammatory disease of the sinuses. This was done for right sided headache. Work done 05/29/2013 includes basic     Outpatient Encounter Prescriptions as of 06/03/2013  Medication Sig  . amoxicillin-clavulanate (AUGMENTIN) 875-125 MG per tablet Take 1 tablet by mouth 2 (two) times daily.  . carbidopa-levodopa (SINEMET IR) 25-100 MG per tablet Take by mouth. Take 2-3 at 9am, 2 at 1 pm, 2 at 5pm, 2 at 9pm  . Carbidopa-Levodopa ER (SINEMET CR) 25-100 MG tablet  controlled release Take 1 tablet by mouth at bedtime.  . cholecalciferol (VITAMIN D) 1000 UNITS tablet Take 1,000 Units by mouth daily.  . fluticasone (FLONASE) 50 MCG/ACT nasal spray Place into both nostrils daily.  Marland Kitchen gabapentin (NEURONTIN) 300 MG capsule Take 300 mg by mouth 3 (three) times daily.   . hydrochlorothiazide (HYDRODIURIL) 25 MG tablet Take 25 mg by mouth daily.  Marland Kitchen oxyCODONE-acetaminophen (PERCOCET/ROXICET) 5-325 MG per tablet Take 1-2 tablets by mouth as needed for severe pain.  . pramipexole (MIRAPEX) 0.5 MG tablet Take 0.5 mg by mouth 3 (three) times daily.  . selegiline (ELDEPRYL) 5 MG capsule Take 5 mg by mouth daily.  Marland Kitchen tetrahydrozoline 0.05 % ophthalmic solution Place 1 drop into both eyes 2 (two) times daily as needed (for itchy eyes).  Dema Severin Petrolatum-Mineral Oil (STYE OP) Apply 1 application to eye 2 (two) times daily as needed (for styes).     Review of Systems  Constitutional: Negative.        Right leg swelling  HENT: Negative.   Eyes: Negative.   Respiratory: Negative.   Cardiovascular: Negative.   Gastrointestinal: Negative.   Endocrine: Negative.   Musculoskeletal: Negative.   Skin: Negative.   Allergic/Immunologic: Negative.   Neurological: Negative.   Hematological: Negative.   Psychiatric/Behavioral: Negative.   All other systems reviewed and are negative.    BP 120/72  Pulse 40  Ht 5\' 9"  (1.753 m)  Wt 176 lb (79.833 kg)  BMI 25.98 kg/m2  Physical Exam  Nursing  note and vitals reviewed. Constitutional: He is oriented to person, place, and time. He appears well-developed and well-nourished.  HENT:  Head: Normocephalic.  Nose: Nose normal.  Mouth/Throat: Oropharynx is clear and moist.  Eyes: Conjunctivae are normal. Pupils are equal, round, and reactive to light.  Neck: Normal range of motion. Neck supple. No JVD present.  Cardiovascular: Normal rate, regular rhythm, S1 normal, S2 normal, normal heart sounds and intact distal pulses.   Exam reveals no gallop and no friction rub.   No murmur heard. Pulmonary/Chest: Effort normal and breath sounds normal. No respiratory distress. He has no wheezes. He has no rales. He exhibits no tenderness.  Abdominal: Soft. Bowel sounds are normal. He exhibits no distension. There is no tenderness.  Musculoskeletal: Normal range of motion. He exhibits no edema and no tenderness.  Lymphadenopathy:    He has no cervical adenopathy.  Neurological: He is alert and oriented to person, place, and time. Coordination normal.  Skin: Skin is warm and dry. No rash noted. No erythema.  Psychiatric: He has a normal mood and affect. His behavior is normal. Judgment and thought content normal.      Assessment and Plan

## 2013-06-04 LAB — LIPID PANEL
CHOLESTEROL TOTAL: 185 mg/dL (ref 100–199)
Chol/HDL Ratio: 5.3 ratio units — ABNORMAL HIGH (ref 0.0–5.0)
HDL: 35 mg/dL — AB (ref >39)
LDL Calculated: 127 mg/dL — ABNORMAL HIGH (ref 0–99)
TRIGLYCERIDES: 115 mg/dL (ref 0–149)
VLDL Cholesterol Cal: 23 mg/dL (ref 5–40)

## 2013-06-04 LAB — HEPATIC FUNCTION PANEL
ALK PHOS: 83 IU/L (ref 39–117)
ALT: 14 IU/L (ref 0–44)
AST: 19 IU/L (ref 0–40)
Albumin: 4.5 g/dL (ref 3.6–4.8)
BILIRUBIN DIRECT: 0.09 mg/dL (ref 0.00–0.40)
BILIRUBIN TOTAL: 0.4 mg/dL (ref 0.0–1.2)
Total Protein: 7.6 g/dL (ref 6.0–8.5)

## 2013-06-04 LAB — BASIC METABOLIC PANEL
BUN/Creatinine Ratio: 24 — ABNORMAL HIGH (ref 10–22)
BUN: 20 mg/dL (ref 8–27)
CALCIUM: 9.9 mg/dL (ref 8.6–10.2)
CHLORIDE: 99 mmol/L (ref 97–108)
CO2: 26 mmol/L (ref 18–29)
CREATININE: 0.85 mg/dL (ref 0.76–1.27)
GFR calc Af Amer: 107 mL/min/{1.73_m2} (ref >59)
GFR calc non Af Amer: 93 mL/min/{1.73_m2} (ref >59)
GLUCOSE: 109 mg/dL — AB (ref 65–99)
Potassium: 4.9 mmol/L (ref 3.5–5.2)
Sodium: 142 mmol/L (ref 134–144)

## 2013-06-05 ENCOUNTER — Other Ambulatory Visit: Payer: Self-pay

## 2013-06-05 MED ORDER — PRAVASTATIN SODIUM 20 MG PO TABS: 40.0000 mg | ORAL_TABLET | Freq: Every evening | ORAL | Status: DC

## 2013-06-10 ENCOUNTER — Ambulatory Visit (INDEPENDENT_AMBULATORY_CARE_PROVIDER_SITE_OTHER)
Admission: RE | Admit: 2013-06-10 | Discharge: 2013-06-10 | Disposition: A | Discharge status: Home / Self Care | Payer: Medicare PPO | Source: Ambulatory Visit | Attending: Family Medicine | Admitting: Family Medicine

## 2013-06-10 ENCOUNTER — Encounter: Payer: Self-pay | Admitting: Family Medicine

## 2013-06-10 ENCOUNTER — Ambulatory Visit (INDEPENDENT_AMBULATORY_CARE_PROVIDER_SITE_OTHER): Payer: Medicare PPO | Admitting: Family Medicine

## 2013-06-10 VITALS — BP 124/68 | HR 58 | Temp 97.8°F | Ht 69.0 in | Wt 179.2 lb

## 2013-06-10 DIAGNOSIS — G20 Parkinson's disease: Secondary | ICD-10-CM

## 2013-06-10 DIAGNOSIS — M25519 Pain in unspecified shoulder: Secondary | ICD-10-CM

## 2013-06-10 DIAGNOSIS — M25559 Pain in unspecified hip: Secondary | ICD-10-CM

## 2013-06-10 DIAGNOSIS — R001 Bradycardia, unspecified: Secondary | ICD-10-CM

## 2013-06-10 DIAGNOSIS — I498 Other specified cardiac arrhythmias: Secondary | ICD-10-CM

## 2013-06-10 DIAGNOSIS — I10 Essential (primary) hypertension: Secondary | ICD-10-CM

## 2013-06-10 NOTE — Patient Instructions (Signed)
Don't change your meds.   Go to the lab on the way out.  We'll contact you with your lab report. Rosaria Ferries will call about your referral. Take care.  Glad to see you.

## 2013-06-10 NOTE — Progress Notes (Signed)
Pre visit review using our clinic review tool, if applicable. No additional management support is needed unless otherwise documented below in the visit note.  New patient here at [facility].    Hypertension:    Using medication without problems or lightheadedness: yes Chest pain with exertion:no Edema: R>L leg, at baseline, likely from overuse R>L side due to parkinson's Short of breath: at baseline, able to walk 0.82miles now Bradycardia hx noted.  No syncope.   Elevated Cholesterol: Using medications without problems:yes Muscle aches: likely from back/hip and not from med Diet compliance:yes Exercise:limited  Parkinson's per neuro.  Notes reviewed.  Occ freezing episodes, advised to d/w neuro.  He agrees.    L shoulder and R hip pain.  L shoulder pain with rom worse with int>ext rotation.  No trauma recently.  No arm drop.  R hip pain, with walking and also crossing R leg over L when sitting down.  No trauma.    Will review old [place] records.    PMH and SH reviewed  ROS: See HPI, otherwise noncontributory.  Meds, vitals, and allergies reviewed.   nad Flat facial expression Tm wnl Nasal and OP exam wnl Neck supple, no LA Loletha Grayer but rrr o/w ctab abd soft Ext with R>L upper and lower ext muscle mass, chronic R hip with pain on ROM but not ttp at the greater troch.  L shoulder with ROM intact but pain on int>ext rotation, with scap assist noted and less pain on int rotation. + impingement.

## 2013-06-10 NOTE — Assessment & Plan Note (Signed)
Per neuro, asked pt to talk to Dr. Carles Collet about likely freezing episodes.

## 2013-06-10 NOTE — Assessment & Plan Note (Signed)
Cuff sx noted.  D/w pt about anatomy.  PT likely useful, will refer.  See notes on imaging.  D/w pt.  He agrees.

## 2013-06-10 NOTE — Assessment & Plan Note (Signed)
Likely OA, see notes on films.  Refer to PT in meantime.  Likely with chronic changes related to parkinson's.

## 2013-06-10 NOTE — Assessment & Plan Note (Signed)
Reasonable control, continue as is.  Will review old [place] records.

## 2013-06-10 NOTE — Assessment & Plan Note (Signed)
Asymptomatic. 

## 2013-06-13 ENCOUNTER — Encounter: Payer: Self-pay | Admitting: Family Medicine

## 2013-06-13 DIAGNOSIS — N529 Male erectile dysfunction, unspecified: Secondary | ICD-10-CM | POA: Insufficient documentation

## 2013-06-13 DIAGNOSIS — E785 Hyperlipidemia, unspecified: Secondary | ICD-10-CM | POA: Insufficient documentation

## 2013-06-13 DIAGNOSIS — F4310 Post-traumatic stress disorder, unspecified: Secondary | ICD-10-CM | POA: Insufficient documentation

## 2013-06-26 ENCOUNTER — Telehealth: Payer: Self-pay | Admitting: *Deleted

## 2013-06-26 ENCOUNTER — Other Ambulatory Visit: Payer: Self-pay

## 2013-06-26 MED ORDER — PRAVASTATIN SODIUM 20 MG PO TABS: 20.0000 mg | ORAL_TABLET | Freq: Every evening | ORAL | Status: DC

## 2013-06-26 NOTE — Telephone Encounter (Signed)
Spoke w/ [name].  She reports that both her and her husband's pravastatin rx's were messed up w/ the [place], as she received 80mg  and [name] received 40mg .  Advised her that I am mailing her new rxs to take to the pharmacy of her choice w/ the correct dosage. She is agreeable to this.

## 2013-06-26 NOTE — Telephone Encounter (Signed)
Please call patient's wife Candi. Has question on the pravastation and the mg's.

## 2013-07-11 ENCOUNTER — Ambulatory Visit (INDEPENDENT_AMBULATORY_CARE_PROVIDER_SITE_OTHER): Payer: Medicare PPO | Admitting: Neurology

## 2013-07-11 ENCOUNTER — Encounter: Payer: Self-pay | Admitting: Neurology

## 2013-07-11 VITALS — BP 122/78 | HR 58 | Temp 98.8°F | Ht 69.0 in | Wt 178.0 lb

## 2013-07-11 DIAGNOSIS — G20A1 Parkinson's disease without dyskinesia, without mention of fluctuations: Secondary | ICD-10-CM

## 2013-07-11 DIAGNOSIS — G20 Parkinson's disease: Secondary | ICD-10-CM

## 2013-07-11 DIAGNOSIS — G249 Dystonia, unspecified: Secondary | ICD-10-CM

## 2013-07-11 DIAGNOSIS — G4733 Obstructive sleep apnea (adult) (pediatric): Secondary | ICD-10-CM

## 2013-07-11 DIAGNOSIS — R279 Unspecified lack of coordination: Secondary | ICD-10-CM

## 2013-07-11 MED ORDER — AMANTADINE HCL 100 MG PO CAPS: 100.0000 mg | ORAL_CAPSULE | Freq: Two times a day (BID) | ORAL | Status: DC

## 2013-07-11 NOTE — Progress Notes (Signed)
[name] was seen today in the movement disorders clinic for neurologic consultation at the request of Dr. Annette Stable. His PCP is at the [facility] in [place].  The consultation is for the evaluation of Parkinson's disease.  The patient is accompanied by his wife who supplements the history.  The patient previously saw Dr. Jennelle Human at [facility] and I do have several of those records.  Patient was diagnosed with Parkinson's disease about 15 years ago, at the age of 64-2 years old.  He states that the first sx was not swinging of the R arm and some minor balance changes.  Balance has gotten worse with time.   The patient is currently on carbidopa/levodopa 25/100, 3 at 9am, 2 at 1, 3 at 5pm, 2 at 9 pm and 1 carbidopa/levodopa 25/100 CR at bedtime.  He is also on Mirapex 0.5 mg 3 times a day and selegiline 5 mg daily.  Because of dyskinesia, Dr. Nicki Reaper did talk to him about DBS, but according to Dr. Bary Leriche notes, the patient was skeptical.  He did have a neuropsych evaluation at the [facility] in [place], but it appears that this was more of counseling and no testing.  He did have a nocturnal polysomnogram on 06/20/2010 demonstrating an apnea hypopnea index of 16.  07/11/13 update:  Pt is f/u today re: PD.  This patient is accompanied in the office by his spouse who supplements the history.  He is on carbidopa/levodopa, 3 at 9am, 2 at 1, 3 at 5pm, 2 at 9 pm and takes carbidopa/levodopa CR 25/100  at bedtime.  This is a total daily levodopa dose of 1100 mg per day.  He is on mirapex 0.5 mg three times per day and selegeline 5 mg in the AM.  He states, "I am ready for DBS.  Tell me about what I need to do."  Pt c/o dyskinesias.  Falls on average once per week.  No hallucinations.  No n/v.  No syncope.  States that he saw himself singing at [place] and could not believe how much dyskinesia he had.  He states that when his medications don't work, he "hits a wall."  This is sometimes very unpredictable.   Neuroimaging has   previously been performed.  It is not available for my review today.  PREVIOUS MEDICATIONS: Sinemet, Sinemet CR, Mirapex and Eldpryl  ALLERGIES:  No Known Allergies  CURRENT MEDICATIONS:    Medication List       This list is accurate as of: 07/11/13  5:11 PM.  Always use your most recent med list.               amantadine 100 MG capsule  Commonly known as:  SYMMETREL  Take 1 capsule (100 mg total) by mouth 2 (two) times daily.     carbidopa-levodopa 25-100 MG per tablet  Commonly known as:  SINEMET IR  Take by mouth. Take 2-3 at 9am, 2 at 1 pm, 2 at 5pm, 2 at 9pm     cholecalciferol 1000 UNITS tablet  Commonly known as:  VITAMIN D  Take 1,000 Units by mouth daily.     fluticasone 50 MCG/ACT nasal spray  Commonly known as:  FLONASE  Place into both nostrils daily.     gabapentin 300 MG capsule  Commonly known as:  NEURONTIN  Take 300 mg by mouth 3 (three) times daily.     hydrochlorothiazide 25 MG tablet  Commonly known as:  HYDRODIURIL  Take 25 mg  by mouth daily.     oxyCODONE-acetaminophen 5-325 MG per tablet  Commonly known as:  PERCOCET/ROXICET  Take 1-2 tablets by mouth as needed for severe pain.     pramipexole 0.5 MG tablet  Commonly known as:  MIRAPEX  Take 0.5 mg by mouth 3 (three) times daily.     pravastatin 20 MG tablet  Commonly known as:  PRAVACHOL  Take 1 tablet (20 mg total) by mouth every evening.     selegiline 5 MG capsule  Commonly known as:  ELDEPRYL  Take 5 mg by mouth daily.     STYE OP  Apply 1 application to eye 2 (two) times daily as needed (for styes).     tetrahydrozoline 0.05 % ophthalmic solution  Place 1 drop into both eyes 2 (two) times daily as needed (for itchy eyes).         PAST MEDICAL HISTORY:   Past Medical History  Diagnosis Date  . Parkinson's disease     dx'ed 15 years ago  . PTSD (post-traumatic stress disorder)   . Bradycardia   . Hypertension     treated with HCTZ  . Depression   . Sleep apnea      doesn't use C-pap  . Varicose veins   . Kidney stone     via xray  . Headache(784.0)     tension headaches  . Cancer 2013    skin cancer  . Arthritis   . History of chicken pox   . GERD (gastroesophageal reflux disease)     PAST SURGICAL HISTORY:   Past Surgical History  Procedure Laterality Date  . Cyst removed       from lip as a child  . Skin cancer removed      from ears  . Lumbar laminectomy/decompression microdiscectomy Bilateral 12/14/2012    Procedure: Bilateral lumbar three-four, four-five decompressive laminotomy/foraminotomy;  Surgeon: Charlie Pitter, MD;  Location: [facility];  Service: Neurosurgery;  Laterality: Bilateral;    SOCIAL HISTORY:   History   Social History  . Marital Status: Married    Spouse Name: N/A    Number of Children: N/A  . Years of Education: N/A   Occupational History  . disabled     2001, [employer]  .      search and rescue helicopter   Social History Main Topics  . Smoking status: Never Smoker   . Smokeless tobacco: Former Systems developer  . Alcohol Use: Yes     Comment: occasional (twice per month)  . Drug Use: No  . Sexual Activity: Not on file   Other Topics Concern  . 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    Married 2014    FAMILY HISTORY:   Family Status  Relation Status Death Age  . Mother Alive     Arthritis, unknown medical hx  . Father Deceased     Lung Cancer  . Sister Alive     1 and one half sister  . Brother Alive   . Son Alive   . Daughter Alive     2    ROS:  A complete 10 system review of systems was obtained and was unremarkable apart from what is mentioned above.  PHYSICAL EXAMINATION:    VITALS:   Filed Vitals:   07/11/13 1251  BP: 122/78  Pulse: 58  Temp:  98.8 F (37.1 C)  TempSrc: Oral  Height: 5\' 9"  (1.753 m)  Weight: 178 lb (80.74 kg)    GEN:  The patient appears stated age and is in  NAD. HEENT:  Normocephalic, atraumatic.  The mucous membranes are moist. The superficial temporal arteries are without ropiness or tenderness. CV:  RRR Lungs:  CTAB Neck/HEME:  There are no carotid bruits bilaterally.  Neurological examination:  Orientation: The patient is alert and oriented x3. Fund of knowledge is appropriate.  Recent and remote memory are intact.  Attention and concentration are normal.    Able to name objects and repeat phrases. Cranial nerves: There is good facial symmetry. Pupils are equal round and reactive to light bilaterally. Fundoscopic exam reveals clear margins bilaterally. Extraocular muscles are intact. The visual fields are full to confrontational testing. The speech is fluent but mildly dysarthric and hypophonic. Soft palate rises symmetrically and there is no tongue deviation. Hearing is intact to conversational tone. Sensation: Sensation is intact to light touch throughout. Motor: Strength is 5/5 in the bilateral upper and lower extremities.   Shoulder shrug is equal and symmetric.  There is no pronator drift. Deep tendon reflexes: Deferred today.  Movement examination: Tone: Tone is normal today. Abnormal movements: There is moderate to severe dyskinesias in both upper extremities, the lower extremities and in the head.  (Interestingly, the patient states that it is time for his next dose). Coordination:  There is very minimal decremation with RAM's, seen primarily in the right upper extremity with finger taps and alternation of supination/pronation of the forearm. Gait and Station: The patient has no difficulty arising out of a deep-seated chair without the use of the hands. The patient's stride length is just mildly decreased.  Armswing is actually fairly good today.  The patient has a negative pull test.      ASSESSMENT/PLAN:  1.  Parkinsons disease by hx. He has been seeing Dr. Jennelle Human through the [facility].    - He is currently on  1100 mg of levodopa per day.  He has motor fluctuations, including dyskinesia with medication, and is experiencing on/off phenomenon.  He is potentially a good candidate for DBS.  -He will remain on pramipexole, 0.5 mg 3 times per day as well as the selegiline 5 mg in the morning.  Risks, benefits, side effects and alternative therapies were discussed.  The opportunity to ask questions was given and they were answered to the best of my ability.  The patient expressed understanding and willingness to follow the outlined treatment protocols.  -The patient asked multiple questions about DBS therapy and I spent greater than 50% of the 40 min visit in counseling.  We talked about the procedure itself as well as the presurgical workup to determine candidacy.  We talked about risks and benefits.  We talked about the things that helps, as well as the things it does not.  He is very interested.  I will set him up for neuropsych testing as well as an on/off test.  -I will add amantadine, 100 mg 3 times a day for the current dyskinesias.  -If his neuropsych testing an on/off test is favorable, we will proceed with MRI and an appointment with Dr. Vertell Limber. 2.  LBP.   -This has essentially resolved status post lumbar laminectomy in aug, 2014. 3.  OSAS  -We talked about morbidity and mortality associated with untreated sleep apnea.  This probably contributes to EDS.  He was not able to  tolerate the cpap despite trying multiple masks. 4.  I will plan on seeing the patient back in the next few weeks for his on/off test, sooner should new neurologic issues arise.

## 2013-07-11 NOTE — Patient Instructions (Signed)
1. You have been referred to Neuro Psych testing at [facility]. They will call you directly to schedule an appointment.  Please call [phone]   if you do not hear from them. 2. We have scheduled you for an ON/OFF test on 07/24/2013 at 2:30 pm. Please stop your Parkinson's medication starting at 2:30 pm on 07/23/2013.

## 2013-07-24 ENCOUNTER — Ambulatory Visit (INDEPENDENT_AMBULATORY_CARE_PROVIDER_SITE_OTHER): Payer: Medicare PPO | Admitting: Neurology

## 2013-07-24 ENCOUNTER — Encounter: Payer: Self-pay | Admitting: Neurology

## 2013-07-24 VITALS — BP 144/84 | HR 56 | Resp 14 | Ht 69.0 in | Wt 180.0 lb

## 2013-07-24 DIAGNOSIS — R279 Unspecified lack of coordination: Secondary | ICD-10-CM

## 2013-07-24 DIAGNOSIS — G20 Parkinson's disease: Secondary | ICD-10-CM

## 2013-07-24 DIAGNOSIS — G249 Dystonia, unspecified: Secondary | ICD-10-CM

## 2013-07-24 MED ORDER — CARBIDOPA-LEVODOPA 25-100 MG PO TABS
3.0000 | ORAL_TABLET | Freq: Once | ORAL | Status: AC
  Administered 2013-07-24: 3 via ORAL

## 2013-07-24 NOTE — Progress Notes (Signed)
[name] was seen today in the movement disorders clinic for neurologic consultation at the request of Dr. Annette Stable. His PCP is at the [facility] in [place].  The consultation is for the evaluation of Parkinson's disease.  The patient is accompanied by his wife who supplements the history.  The patient previously saw Dr. Jennelle Human at [facility] and I do have several of those records.  Patient was diagnosed with Parkinson's disease about 15 years ago, at the age of 63 years old.  He states that the first sx was not swinging of the R arm and some minor balance changes.  Balance has gotten worse with time.   The patient is currently on carbidopa/levodopa 25/100, 3 at 9am, 2 at 1, 3 at 5pm, 2 at 9 pm and 1 carbidopa/levodopa 25/100 CR at bedtime.  He is also on Mirapex 0.5 mg 3 times a day and selegiline 5 mg daily.  Because of dyskinesia, Dr. Nicki Reaper did talk to him about DBS, but according to Dr. Bary Leriche notes, the patient was skeptical.  He did have a neuropsych evaluation at the [facility] in [place], but it appears that this was more of counseling and no testing.  He did have a nocturnal polysomnogram on 06/20/2010 demonstrating an apnea hypopnea index of 16.  07/11/13 update:  Pt is f/u today re: PD.  This patient is accompanied in the office by his spouse who supplements the history.  He is on carbidopa/levodopa, 3 at 9am, 2 at 1, 3 at 5pm, 2 at 9 pm and takes carbidopa/levodopa CR 25/100  at bedtime.  This is a total daily levodopa dose of 1100 mg per day.  He is on mirapex 0.5 mg three times per day and selegeline 5 mg in the AM.  He states, "I am ready for DBS.  Tell me about what I need to do."  Pt c/o dyskinesias.  Falls on average once per week.  No hallucinations.  No n/v.  No syncope.  States that he saw himself singing at [place] and could not believe how much dyskinesia he had.  He states that when his medications don't work, he "hits a wall."  This is sometimes very unpredictable.  07/24/13:  Present with  wife for on/off test.  Actually went to PT late yesterday afternoon and was off of medication and had a very hard time.  While he has been off medication for 36 hours, his wife reports that she thinks that he has done remarkably well.  There has been minimal tremor and he really hasn't been dragging his leg much.  He expresses desire to proceed with DBS.   Neuroimaging has  previously been performed.  It is not available for my review today.  PREVIOUS MEDICATIONS: Sinemet, Sinemet CR, Mirapex and Eldpryl  ALLERGIES:  No Known Allergies  CURRENT MEDICATIONS:    Medication List       This list is accurate as of: 64/8/15  2:26 PM.  Always use your most recent med list.               amantadine 100 MG capsule  Commonly known as:  SYMMETREL  Take 1 capsule (100 mg total) by mouth 2 (two) times daily.     carbidopa-levodopa 25-100 MG per tablet  Commonly known as:  SINEMET IR  Take by mouth. Take 2-3 at 9am, 2 at 1 pm, 2 at 5pm, 2 at 9pm     cholecalciferol 1000 UNITS tablet  Commonly known as:  VITAMIN D  Take 1,000 Units by mouth daily.     fluticasone 50 MCG/ACT nasal spray  Commonly known as:  FLONASE  Place into both nostrils daily.     gabapentin 300 MG capsule  Commonly known as:  NEURONTIN  Take 300 mg by mouth 3 (three) times daily.     hydrochlorothiazide 25 MG tablet  Commonly known as:  HYDRODIURIL  Take 25 mg by mouth daily.     oxyCODONE-acetaminophen 5-325 MG per tablet  Commonly known as:  PERCOCET/ROXICET  Take 1-2 tablets by mouth as needed for severe pain.     pramipexole 0.5 MG tablet  Commonly known as:  MIRAPEX  Take 0.5 mg by mouth 3 (three) times daily.     pravastatin 20 MG tablet  Commonly known as:  PRAVACHOL  Take 1 tablet (20 mg total) by mouth every evening.     selegiline 5 MG capsule  Commonly known as:  ELDEPRYL  Take 5 mg by mouth daily.     STYE OP  Apply 1 application to eye 2 (two) times daily as needed (for styes).      tetrahydrozoline 0.05 % ophthalmic solution  Place 1 drop into both eyes 2 (two) times daily as needed (for itchy eyes).         PAST MEDICAL HISTORY:   Past Medical History  Diagnosis Date  . Parkinson's disease     dx'ed 15 years ago  . PTSD (post-traumatic stress disorder)   . Bradycardia   . Hypertension     treated with HCTZ  . Depression   . Sleep apnea     doesn't use C-pap  . Varicose veins   . Kidney stone     via xray  . Headache(784.0)     tension headaches  . Cancer 2013    skin cancer  . Arthritis   . History of chicken pox   . GERD (gastroesophageal reflux disease)     PAST SURGICAL HISTORY:   Past Surgical History  Procedure Laterality Date  . Cyst removed       from lip as a child  . Skin cancer removed      from ears  . Lumbar laminectomy/decompression microdiscectomy Bilateral 12/14/2012    Procedure: Bilateral lumbar three-four, four-five decompressive laminotomy/foraminotomy;  Surgeon: Charlie Pitter, MD;  Location: [facility];  Service: Neurosurgery;  Laterality: Bilateral;    SOCIAL HISTORY:   History   Social History  . Marital Status: Married    Spouse Name: N/A    Number of Children: N/A  . Years of Education: N/A   Occupational History  . disabled     2001, PD  .      search and rescue helicopter   Social History Main Topics  . Smoking status: Never Smoker   . Smokeless tobacco: Former Systems developer  . Alcohol Use: Yes     Comment: occasional (twice per month)  . Drug Use: No  . Sexual Activity: Not on file   Other Topics Concern  . 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    Married 2014    FAMILY HISTORY:   Family Status  Relation Status Death Age  . Mother Alive     Arthritis, unknown medical hx  . Father Deceased     Lung Cancer  . Sister Alive  1 and one half sister  . Brother Alive   . Son Alive   . Daughter Alive      2    ROS:  A complete 10 system review of systems was obtained and was unremarkable apart from what is mentioned above.  PHYSICAL EXAMINATION:    VITALS:   Filed Vitals:   07/24/13 1423  BP: 144/84  Pulse: 56  Resp: 14  Height: 5\' 9"  (1.753 m)  Weight: 180 lb (81.647 kg)    GEN:  The patient appears stated age and is in NAD. HEENT:  Normocephalic, atraumatic.  The mucous membranes are moist. The superficial temporal arteries are without ropiness or tenderness. CV:  RRR Lungs:  CTAB Neck/HEME:  There are no carotid bruits bilaterally.  Neurological examination:  Orientation: The patient is alert and oriented x3. Fund of knowledge is appropriate.  Recent and remote memory are intact.  Attention and concentration are normal.    Able to name objects and repeat phrases. Cranial nerves: There is good facial symmetry. Pupils are equal round and reactive to light bilaterally. Fundoscopic exam reveals clear margins bilaterally. Extraocular muscles are intact. The visual fields are full to confrontational testing. The speech is fluent but mildly dysarthric and hypophonic. Soft palate rises symmetrically and there is no tongue deviation. Hearing is intact to conversational tone. Sensation: Sensation is intact to light touch throughout. Motor: Strength is 5/5 in the bilateral upper and lower extremities.   Shoulder shrug is equal and symmetric.  There is no pronator drift. Deep tendon reflexes: Deferred today.  Movement examination: Tone: Tone is normal today. Complete UPDRS Motor done in on/off state.  Was very surprised that UPDRS motor in off state only 20 but this did decrease to only 2 in the on state.  ASSESSMENT/PLAN:  1.  Parkinsons disease by hx. He has been seeing Dr. Jennelle Human through the [facility].    -Even though he had been off of levodopa for almost 36 hours prior to todays visit, I still think that there must have been some working.  There was definite  improvement after the levodopa but he looked better than I have seen him in the past even before the levodopa.  He did have tremor pre-levodopa and none after.    - He is currently on 1100 mg of levodopa per day.  He has motor fluctuations, including dyskinesia with medication, and is experiencing on/off phenomenon.  He is potentially a good candidate for DBS.  -He will remain on pramipexole, 0.5 mg 3 times per day as well as the selegiline 5 mg in the morning.  Risks, benefits, side effects and alternative therapies were discussed.  The opportunity to ask questions was given and they were answered to the best of my ability.  The patient expressed understanding and willingness to follow the outlined treatment protocols.  -The patient asked multiple questions about DBS therapy and I spent greater than 50% of the 40 min visit in counseling.  We talked about the procedure itself as well as the presurgical workup to determine candidacy.  We talked about risks and benefits.  We talked about the things that helps, as well as the things it does not.  He is very interested.  He is awaiting neuropsych testing and I will set him an appt to meet Dr. Vertell Limber.  -He just started amantadine, 100 mg 3 times a day for the current dyskinesias.  -He was given pt information/education re: DBS  -I offered  him access to a pt rep who has had DBS as well as a [employer] rep but he wants to hold on that for now 2.  LBP.   -This has essentially resolved status post lumbar laminectomy in aug, 2014. 3.  OSAS  -We talked about morbidity and mortality associated with untreated sleep apnea.  This probably contributes to EDS.  He was not able to tolerate the cpap despite trying multiple masks. 4.  90 min visit time, video taping done with pt consent, 60 min was face to face time, >50% in counseling as above discussing surgery and its r/b/se

## 2013-07-24 NOTE — Patient Instructions (Addendum)
1. We are referring you to Dr Vertell Limber at [place]. They will call you directly to set up an appointment date and time. If you do not hear from them they can be contacted directly at [phone]. 2. April 21st at [facility] from 4-5pm Dr Tat and the rep from [employer] will be speaking if you would like to attend. This is located in the [facility] in [place] [address] and [address]. For more information you can contact [name] or Levada Dy at [phone]. Let us know if you would like [place] to set up a meeting between you and the [employer] Rep in our office.   Deep Brain Stimulation  Is it the right choice for me?   What is Deep Brain Stimulation (DBS) Surgery?  DBS is a surgical procedure used to treat symptoms of Parkinson's disease (PD). It involves the implantation of an electrode into the brain (one on each side). The area of the brain that is typically targeted in PD is the subthalamic nucleus.   How does DBS work?  PD is caused by the degeneration of brain cells in a specific part of the brain which make a chemical (neurotransmitter) called dopamine. As time goes by, more and more cells degenerate and the level of dopamine in the brain declines. As a result of this dopamine deficiency, there is a certain circuit in the brain which becomes abnormally overactive. Many symptoms of PD are due to this abnormal, overactive circuit. With DBS, high frequency electrical stimulation is used to disrupt this circuit, thereby blocking the symptoms of PD that were previously being mediated through that circuit. The three main symptoms of PD are shaking (tremor), slowness of movement (bradykinesia), and stiffness (rigidity). All of these symptoms are mediated through this small circuit. Therefore, DBS is very effective in blocking these symptoms. It is important to remember, however, that DBS does not "cure" PD, but rather is a very effective method of treating the symptoms of the  disease.   What is actually done during the operation?  The surgical procedure involves the implantation of 2 electrodes (one on each side of the brain). The electrodes are connected to 2 wires, which are then connected to a generator- pacemaker like device (either one or two) in the chest. The generator (and wires) are placed under the skin similar to a cardiac pacemaker, thus the device itself is not visible. The implanted hardware does, however, produce a lump on the chest where the generator is placed and two small bumps on the scalp where underneath there are small plastic caps which are screwed into the skull and secure the electrode.   What symptoms can I expect DBS to treat?  DBS treats many, but not all symptoms of PD. As mentioned above, tremor, stiffness (rigidity), and slowness of movement (bradykinesia) all respond well to DBS therapy. In addition, many patients with advanced PD have problems with what we call "motor fluctuations". This refers to the wearing off of medication before the next dose associated with breakthrough of PD symptoms, and at other times the effects of excess medication, such as involuntary wiggling (dyskinesia). Because the electrical stimulation is constant, the effect is continuous. Therefore motor fluctuations can be significantly reduced. Furthermore, after DBS most patients are able to significantly reduce the amount of Parkinson's medications they were previously taking. Therefore, side effects of these medications can be significantly reduced as well, and often completely eliminated. Common anti-Parkinson medication side effects include: involuntary wiggling (dyskinesia), visual  distortions and hallucinations, nausea and vomiting, and lightheadedness.   What symptoms are not treated with DBS?  Some symptoms of PD are mediated through other brain circuits. Therefore, those symptoms would not be expected to improve with DBS. These symptoms include: soft and mumbled  speech (hypophonia), balance trouble, and memory deficits. Even if the DBS surgery is done perfectly, the patient will still have PD. Therefore, because DBS does not block all of the effected brain circuits, the above mentioned symptoms will likely continue to progress and worsen with time.   How functional can I expect to be following DBS surgery?  Most patients who are good candidates improve with DBS. Think about how functional you are now, when your medicines are "kicked in" and working at their very best. After DBS we can often get you to that point and keep you there, without all of the fluctuations and the medication side effects. Some patients with PD have bad tremor that does not respond well to medication. DBS can work well to control tremor even when medication cannot.   What are the risks of surgery?  Because the surgery involves introducing a foreign object into the brain, there are inherent risks that are present. First, there is a very small risk, approximately 1%, of having bleeding into the brain causing symptoms similar to that of a stroke. Secondly, there is a 5-7% chance of having an infection related to the procedure. If the device gets infected, then the treatment usually requires that the infected hardware be removed temporarily while antibiotics are given. After the infection is resolved, then the hardware needs to be re-implanted. This would not leave the patient with permanent problems, but it is easy to understand how disappointed someone might be if they have to go through the surgery again. Typical symptoms of infection include redness, swelling, or pain around the device on the skin. There is theoretically a very small chance (much less than 1%) that an infection could spread to the brain. This, of course, would be much more serious. Another small risk of brain surgery is possible seizure (2-3%). A seizure produces transient sudden loss of consciousness and generalized shaking  (convulsion). This can be caused by irritation of the brain during the operation. If a seizure occurs, it is almost always at the time of operation. It may require temporarily being treated with seizure medications, but this is typically only short term.   How much trouble is it to get DBS?  Unfortunately, undergoing DBS surgery is a process involving multiple steps. Even prior to surgery, there are several steps that must be done. The surgery itself takes place in three separate parts. About a week prior to insertion of the electrodes, you will be seen in an ambulatory surgery center to put in markers into the skull, called fiducials. This allows [place] to plan the surgery and to better localize the area in which we will operate. One week later, stage 1 of the procedure will be done in which the electrodes are implanted. Approximately one week later, stage 2 of the surgery will be done in which the generator (battery) is inserted. Stage 1 of the surgery usually takes several hours. This is when the electrode is placed. This surgery has to be done while the patient is awake. Local anesthesia is used, so the procedure is not painful, but obviously it is a little scary to be operated on while you are awake. Furthermore, patients need to be off of their anti-Parkinson medication during  the operation, so that we can more easily identify the abnormal circuit in the brain. It is unpleasant being off anti-Parkinson medication, even for this short time. Approximately 6 weeks after the electrode placement, programming of the device will take place. This allows [place] to alter the type of stimulation and optimize the beneficial effects. This is done over several clinic visits. The second visit is usually just a week after the first, but subsequent visits will be less frequent. Eventually you shouldn't need to be seen more than once every 4 months or so. Overall, you should expect several programming visits before you see the full  benefit from DBS surgery.   How long does the hardware last?  The generator runs on a battery inside the device. This battery typically needs to be replaced every 3 to 5 years. The battery replacement operation, however, is much easier than the initial elaborate operation. It is typically done as an outpatient procedure.   Does DBS always work?  The key to success is exact placement of the electrode. As you can imagine, the brain has many circuits which are closely packed together. If the electrode is close to being in the right position, but not quite, then there may be a partial response rather than a complete response. If this happens, we may have to turn up the stimulation to try and more completely block the circuit. If we do this, however, we may effect other adjacent circuits that we are not intending to effect, and thereby produce stimulation-related side effects such as slurred speech or facial muscle pulling. These side effects can be easily eliminated by reducing the strength of the stimulation, but then some of the Parkinson symptoms may break through.   Is DBS the right choice for you?  As you can now see, there are many things that carefully need to be considered when making this decision. DBS is not appropriate for all patients with PD. The ultimate decision is yours to make. It is our job to provide you with all the pros and cons, so that you can make the choice that is right for you.  Logistical Details: Pre-Operative Visits:  1) "On-Off" Testing. This visit takes place in the clinic with Dr. Carles Collet several weeks prior to surgery to help determine if you are a good DBS candidate. You will come to the clinic having NOT TAKEN your PD medications. A series of physical examination tests will be done. Then you will be given a dose of carbidopa/levodopa dissolved in ginger ale. Approximately 30 minutes later you will be re-examined with the same tests to see how you respond.   IT IS EXTREMELY  IMPORTANT NOT TO TAKE YOUR PARKINSON MEDICATIONS ON THE DAY OF THIS CLINIC VISIT.   2) Neuropsychological Testing. This is standard testing in all potential candidates to help determine those patients that may be at risk for developing worsening cognition from the procedure. This is a long clinic visit (multiple hours).  3) Pre-Operative MRI. If you are deemed to be a good surgical candidate based on the above 2 visits, you will need to have MRI imaging done. It is very important that we get high quality study. You must have someone accompany you to this visit as we may have to give you sedation in order to make sure the MRI images are of adequate quality.  What to expect regarding surgery:  1. The first step involves placement of the fiducial (reference) pins. This is done the week before surgery  by Dr. Vertell Limber. You are given 5 local injections of anesthetic (numbing medication). Next, 5 pins are screwed into the skull. Following the placement of the pins, you will be transferred down to have a head CT scan. The CT is used in planning for the surgery.  2. Surgery typically takes place one week later. You will have been off all of your Parkinson medications.  3. You will have the sense of "hurry up and wait" multiple times throughout the day, but it is extremely important to remain as patient as possible. It is during these times that we are busy "behind the scenes" doing the surgical planning with all of the imaging scans that you've had done.  4. In the pre-op area, you will meet with the nurses and anesthesia staff. You may have a catheter placed into the bladder. Once you are taken back to the OR suite, you will be placed in a "beach chair" position. You will not be under general anesthesia. We need you to be awake during certain parts to allow [place] to do important testing. The actual surgical procedure is not painful. It is done with local anesthetic agents. However, the procedure can take up to 6-8 hours,  and it is expected that you'll become uncomfortable. We try to minimize any sedating medications, but can give you something if needed.  5. You will have a bad haircut, but it will grow back!  6. Following the surgery, you will stay overnight in the hospital for observation.  7. The following day, you will have a very special kind brain MRI scan to allow [place] to evaluate the placement of the electrodes as this is very helpful in subsequent programming. Usually, patients are discharged home the day after surgery.   * It is extremely important to remember that after having DBS surgery, you will no longer be able to have a typical MRI scan. This can lead to heating of the electrode wires causing serious burns to the brain and even death.   8. About 1 week later you will return for an outpatient surgery that lasts 1-2 hours during which the generator(s) will be placed. You will go home on the same day as the surgery. You will find that you are more uncomfortable after this surgery than your first surgery. You will be given medications to help with this. The pain from this surgery usually resolves in 2 or 3 days.

## 2013-07-25 ENCOUNTER — Telehealth: Payer: Self-pay | Admitting: Neurology

## 2013-07-25 NOTE — Telephone Encounter (Signed)
Left message on machine for patient to call back.

## 2013-07-25 NOTE — Telephone Encounter (Signed)
Message copied by Annamaria Helling on Thu Jul 25, 2013 12:48 PM ------      Message from: TAT, [place]: Wed Jul 24, 2013  5:01 PM       Call him and ask him if he wants to do the pre-op MRI now or if he want to wait until he meets Dr. Vertell Limber.  If he thinks tht he is likely going to proceed with DBS, it is probably a good idea to proceed with the pre-op MRI (via the protocol)      ----- Message -----         From: Annamaria Helling, [place]: 07/24/2013   4:23 PM           To: Eustace Quail Tat, DO            No, do you want me to schedule?            ----- Message -----         From: Ludwig Clarks, DO         Sent: 07/24/2013   4:21 PM           To: Annamaria Helling, CMA            Is he already scheduled for pre-op MRI?             ------

## 2013-07-29 NOTE — Telephone Encounter (Signed)
Spoke with spouse. They would like to go ahead and order MR. They would like this the same day as Dr Melven Sartorius appt. They will call me once they hear from Dr Melven Sartorius office and let me know of the date/time they will see him then we will get this set up.

## 2013-08-06 ENCOUNTER — Ambulatory Visit: Payer: Medicare PPO | Admitting: Family Medicine

## 2013-08-16 ENCOUNTER — Telehealth: Payer: Self-pay | Admitting: Neurology

## 2013-08-16 NOTE — Telephone Encounter (Signed)
[name] from dr Vertell Limber [phone] office states that the appt is June 10 @ 9:00

## 2013-10-01 ENCOUNTER — Telehealth: Payer: Self-pay | Admitting: Neurology

## 2013-10-01 DIAGNOSIS — G20 Parkinson's disease: Secondary | ICD-10-CM

## 2013-10-01 DIAGNOSIS — I10 Essential (primary) hypertension: Secondary | ICD-10-CM

## 2013-10-01 DIAGNOSIS — Z01818 Encounter for other preprocedural examination: Secondary | ICD-10-CM

## 2013-10-01 NOTE — Telephone Encounter (Signed)
[name], I see Dr. Vertell Limber has seen patient and patient has decided to proceed with DBS.  Will need pre-op MRI.  Please coordinate with Lennette Bihari as protocol will be changing again.  Also, I will need to see patient one time before surgery (his surgery will likely be in august so July f/u is fine)

## 2013-10-02 NOTE — Telephone Encounter (Signed)
MR scheduled for July 6th at 5:00 pm to arrive at 4:00 pm for labs. Tried to reach [place] to make sure he is available for date and time before I call to inform patient. He will not be available until tomorrow. Will follow up at that time.

## 2013-10-02 NOTE — Telephone Encounter (Signed)
Left message on machine for patient to call back. To make him aware of MR appt date/time and schedule a preop appt with Dr Tat for after this date. Spoke with Dusty with [employer] and either him or kevin will be available for the scan that day.

## 2013-10-03 ENCOUNTER — Other Ambulatory Visit: Payer: Self-pay | Admitting: Neurosurgery

## 2013-10-03 ENCOUNTER — Other Ambulatory Visit (HOSPITAL_COMMUNITY): Payer: Self-pay | Admitting: Neurosurgery

## 2013-10-03 DIAGNOSIS — G20 Parkinson's disease: Secondary | ICD-10-CM

## 2013-10-04 NOTE — Telephone Encounter (Signed)
Patient has not called back. Please look out for the phone call next week.

## 2013-10-04 NOTE — Telephone Encounter (Signed)
LMOM again for patient to call back to make him aware of MR date/time and to make a preop appt after MR.

## 2013-10-07 ENCOUNTER — Telehealth: Payer: Self-pay | Admitting: Neurology

## 2013-10-07 NOTE — Telephone Encounter (Signed)
Patient given appointment time and date.  Will ask Hinton Dyer or Sherri to schedule pre-op with Dr. Carles Collet.

## 2013-10-07 NOTE — Telephone Encounter (Signed)
See next note

## 2013-10-07 NOTE — Telephone Encounter (Signed)
Pt was returning Jade's call regarding his DBS.  C/B [phone] AND [phone]

## 2013-10-15 NOTE — Telephone Encounter (Signed)
Please advise on below note 

## 2013-10-21 ENCOUNTER — Ambulatory Visit (HOSPITAL_COMMUNITY)
Admission: RE | Admit: 2013-10-21 | Discharge: 2013-10-21 | Disposition: A | Discharge status: Home / Self Care | Payer: Medicare PPO | Source: Ambulatory Visit | Attending: Neurology | Admitting: Neurology

## 2013-10-21 DIAGNOSIS — Z01818 Encounter for other preprocedural examination: Secondary | ICD-10-CM | POA: Insufficient documentation

## 2013-10-21 DIAGNOSIS — G20 Parkinson's disease: Secondary | ICD-10-CM | POA: Insufficient documentation

## 2013-10-21 DIAGNOSIS — G20A1 Parkinson's disease without dyskinesia, without mention of fluctuations: Secondary | ICD-10-CM | POA: Insufficient documentation

## 2013-10-21 LAB — CREATININE, SERUM
CREATININE: 1.09 mg/dL (ref 0.50–1.35)
GFR calc Af Amer: 81 mL/min — ABNORMAL LOW (ref >90)
GFR calc non Af Amer: 70 mL/min — ABNORMAL LOW (ref >90)

## 2013-10-21 MED ORDER — GADOBENATE DIMEGLUMINE 529 MG/ML IV SOLN
20.0000 mL | Freq: Once | INTRAVENOUS | Status: AC | PRN
Start: 2013-10-21 — End: 2013-10-21
  Administered 2013-10-21: 17 mL via INTRAVENOUS

## 2013-10-25 ENCOUNTER — Ambulatory Visit (INDEPENDENT_AMBULATORY_CARE_PROVIDER_SITE_OTHER): Payer: Medicare PPO | Admitting: Neurology

## 2013-10-25 ENCOUNTER — Encounter: Payer: Self-pay | Admitting: Neurology

## 2013-10-25 VITALS — BP 98/68 | HR 76 | Resp 16 | Ht 69.0 in | Wt 179.0 lb

## 2013-10-25 DIAGNOSIS — F444 Conversion disorder with motor symptom or deficit: Secondary | ICD-10-CM

## 2013-10-25 DIAGNOSIS — G4733 Obstructive sleep apnea (adult) (pediatric): Secondary | ICD-10-CM

## 2013-10-25 DIAGNOSIS — G249 Dystonia, unspecified: Secondary | ICD-10-CM

## 2013-10-25 DIAGNOSIS — R279 Unspecified lack of coordination: Secondary | ICD-10-CM

## 2013-10-25 DIAGNOSIS — F449 Dissociative and conversion disorder, unspecified: Secondary | ICD-10-CM

## 2013-10-25 DIAGNOSIS — G20 Parkinson's disease: Secondary | ICD-10-CM

## 2013-10-25 NOTE — Patient Instructions (Signed)
Take your medications as follows prior to surgery: Take Selegiline, Mirapex and Levodopa in normal doses until 12/03/13. On this day take Levodopa and Selegiline normally but decrease Mirapex to twice this day. On 12/04/13 Take 1 Levodopa in the morning, Take 1 Mirapex in the morning, and Take 1 Selegiline in the morning. Do not take any more doses of these medications until after surgery. Call with any questions.

## 2013-10-25 NOTE — Progress Notes (Signed)
[name] was seen today in the movement disorders clinic for neurologic consultation at the request of Dr. Annette Stable. His PCP is at the [facility] in [place].  The consultation is for the evaluation of Parkinson's disease.  The patient is accompanied by his wife who supplements the history.  The patient previously saw Dr. Jennelle Human at [facility] and I do have several of those records.  Patient was diagnosed with Parkinson's disease about 15 years ago, at the age of 80-44 years old.  He states that the first sx was not swinging of the R arm and some minor balance changes.  Balance has gotten worse with time.   The patient is currently on carbidopa/levodopa 25/100, 3 at 9am, 2 at 1, 3 at 5pm, 2 at 9 pm and 1 carbidopa/levodopa 25/100 CR at bedtime.  He is also on Mirapex 0.5 mg 3 times a day and selegiline 5 mg daily.  Because of dyskinesia, Dr. Nicki Reaper did talk to him about DBS, but according to Dr. Bary Leriche notes, the patient was skeptical.  He did have a neuropsych evaluation at the [facility] in [place], but it appears that this was more of counseling and no testing.  He did have a nocturnal polysomnogram on 06/20/2010 demonstrating an apnea hypopnea index of 16.  07/11/13 update:  Pt is f/u today re: PD.  This patient is accompanied in the office by his spouse who supplements the history.  He is on carbidopa/levodopa, 3 at 9am, 2 at 1, 3 at 5pm, 2 at 9 pm and takes carbidopa/levodopa CR 25/100  at bedtime.  This is a total daily levodopa dose of 1100 mg per day.  He is on mirapex 0.5 mg three times per day and selegeline 5 mg in the AM.  He states, "I am ready for DBS.  Tell me about what I need to do."  Pt c/o dyskinesias.  Falls on average once per week.  No hallucinations.  No n/v.  No syncope.  States that he saw himself singing at [place] and could not believe how much dyskinesia he had.  He states that when his medications don't work, he "hits a wall."  This is sometimes very unpredictable.  07/24/13:  Present with  wife for on/off test.  Actually went to PT late yesterday afternoon and was off of medication and had a very hard time.  While he has been off medication for 36 hours, his wife reports that she thinks that he has done remarkably well.  There has been minimal tremor and he really hasn't been dragging his leg much.  He expresses desire to proceed with DBS.  10/25/13 update:  Patient presents today with his wife, who supplements the history.  The patient is preop DBS.  He is scheduled for deep brain stimulation surgery on 12/06/2013.  He is scheduled for IPG placement on 12/13/2013.  Since our last visit, he did have neuropsychometric testing.  Results of this were favorable for DBS placement.  It was recommended that he faithfully wear his CPAP.  He just had his preop MRI of the brain done.  Patient is currently taking levodopa 25/100, on the following schedule: 3/2/3/2 and then an additional carbidopa/levodopa 25/100 CR at bedtime.  This is in addition to pramipexole 0.5 mg 3 times a day and selegiline 5 mg in the morning.  Overall doing well.  No hallucinations.  No falls.  Has had some more of his chronic back pain.   Neuroimaging has  previously  been performed.  It is not available for my review today.  PREVIOUS MEDICATIONS: Sinemet, Sinemet CR, Mirapex and Eldpryl  ALLERGIES:  No Known Allergies  CURRENT MEDICATIONS:    Medication List       This list is accurate as of: 10/25/13  9:44 AM.  Always use your most recent med list.               amantadine 100 MG capsule  Commonly known as:  SYMMETREL  Take 1 capsule (100 mg total) by mouth 2 (two) times daily.     carbidopa-levodopa 25-100 MG per tablet  Commonly known as:  SINEMET IR  Take by mouth. Take 2-3 at 9am, 2 at 1 pm, 2 at 5pm, 2 at 9pm     cholecalciferol 1000 UNITS tablet  Commonly known as:  VITAMIN D  Take 1,000 Units by mouth daily.     fluticasone 50 MCG/ACT nasal spray  Commonly known as:  FLONASE  Place into both  nostrils daily.     gabapentin 300 MG capsule  Commonly known as:  NEURONTIN  Take 300 mg by mouth 3 (three) times daily.     hydrochlorothiazide 25 MG tablet  Commonly known as:  HYDRODIURIL  Take 25 mg by mouth daily.     multivitamin tablet  Take by mouth.     oxyCODONE-acetaminophen 5-325 MG per tablet  Commonly known as:  PERCOCET/ROXICET  Take 1-2 tablets by mouth as needed for severe pain.     pramipexole 0.5 MG tablet  Commonly known as:  MIRAPEX  Take 0.5 mg by mouth 3 (three) times daily.     pravastatin 20 MG tablet  Commonly known as:  PRAVACHOL  Take 1 tablet (20 mg total) by mouth every evening.     selegiline 5 MG capsule  Commonly known as:  ELDEPRYL  Take 5 mg by mouth daily.     STYE OP  Apply 1 application to eye 2 (two) times daily as needed (for styes).     tetrahydrozoline 0.05 % ophthalmic solution  Place 1 drop into both eyes 2 (two) times daily as needed (for itchy eyes).         PAST MEDICAL HISTORY:   Past Medical History  Diagnosis Date  . Parkinson's disease     dx'ed 15 years ago  . PTSD (post-traumatic stress disorder)   . Bradycardia   . Hypertension     treated with HCTZ  . Depression   . Sleep apnea     doesn't use C-pap  . Varicose veins   . Kidney stone     via xray  . Headache(784.0)     tension headaches  . Cancer 2013    skin cancer  . Arthritis   . History of chicken pox   . GERD (gastroesophageal reflux disease)     PAST SURGICAL HISTORY:   Past Surgical History  Procedure Laterality Date  . Cyst removed       from lip as a child  . Skin cancer removed      from ears  . Lumbar laminectomy/decompression microdiscectomy Bilateral 12/14/2012    Procedure: Bilateral lumbar three-four, four-five decompressive laminotomy/foraminotomy;  Surgeon: Charlie Pitter, MD;  Location: [facility];  Service: Neurosurgery;  Laterality: Bilateral;    SOCIAL HISTORY:   History   Social History  . Marital Status: Married      Spouse Name: N/A    Number of Children: N/A  . Years of Education:  N/A   Occupational History  . disabled     2001, PD  .      search and rescue helicopter   Social History Main Topics  . Smoking status: Never Smoker   . Smokeless tobacco: Former Systems developer  . Alcohol Use: Yes     Comment: occasional (twice per month)  . Drug Use: No  . Sexual Activity: Not on file   Other Topics Concern  . 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    Married 2014    FAMILY HISTORY:   Family Status  Relation Status Death Age  . Mother Alive     Arthritis, unknown medical hx  . Father Deceased     Lung Cancer  . Sister Alive     1 and one half sister  . Brother Alive   . Son Alive   . Daughter Alive     2    ROS:  A complete 10 system review of systems was obtained and was unremarkable apart from what is mentioned above.  PHYSICAL EXAMINATION:    VITALS:   Filed Vitals:   10/25/13 0940  BP: 98/68  Pulse: 76  Resp: 16  Height: 5\' 9"  (1.753 m)  Weight: 179 lb (81.194 kg)    GEN:  The patient appears stated age and is in NAD. HEENT:  Normocephalic, atraumatic.  The mucous membranes are moist. The superficial temporal arteries are without ropiness or tenderness. CV:  RRR Lungs:  CTAB Neck/HEME:  There are no carotid bruits bilaterally.  Neurological examination:  Orientation: The patient is alert and oriented x3. Fund of knowledge is appropriate.  Recent and remote memory are intact.  Attention and concentration are normal.    Able to name objects and repeat phrases. Cranial nerves: There is good facial symmetry. Pupils are equal round and reactive to light bilaterally. Fundoscopic exam reveals clear margins bilaterally. Extraocular muscles are intact. The visual fields are full to confrontational testing. The speech is fluent but mildly dysarthric and hypophonic. Soft palate rises  symmetrically and there is no tongue deviation. Hearing is intact to conversational tone. Sensation: Sensation is intact to light touch throughout. Motor: Strength is 5/5 in the bilateral upper and lower extremities.   Shoulder shrug is equal and symmetric.  There is no pronator drift. Deep tendon reflexes: Deferred today.  Movement examination: Tone: Tone is normal today. Abnormal movements: There is no dyskinesia no tremor today. Coordination: There is very minimal decremation with RAM's, seen primarily in the right upper extremity with finger taps and alternation of supination/pronation of the forearm.  Gait and Station: The patient has no difficulty arising out of a deep-seated chair without the use of the hands. The patient's stride length is just mildly decreased. Armswing is actually fairly good today. The patient has a negative pull test.  He does have camptocormia to the right.   ASSESSMENT/PLAN:  1.  Parkinsons disease by hx. He has been seeing Dr. Jennelle Human through the [facility].    -The patient actually looks well today.  - He is currently on 1100 mg of levodopa per day.  He has motor fluctuations, including dyskinesia with medication, and is experiencing on/off phenomenon.  He is a good candidate for DBS and is ready for surgery.  He is on amantadine for dyskinesia.  -He will remain on pramipexole,  0.5 mg 3 times per day as well as the selegiline 5 mg in the morning.  Risks, benefits, side effects and alternative therapies were discussed.  The opportunity to ask questions was given and they were answered to the best of my ability.  The patient expressed understanding and willingness to follow the outlined treatment protocols.  -The patient asked multiple questions about DBS therapy and I spent greater than 50% of the 40 min visit in counseling.  We will plan on starting on the left side of the brain, as symptoms began on the right side of the body.  He plans on having  dual generators placed.  -He was given a weaning schedule for meds preop. 2.  LBP.   -This has essentially resolved status post lumbar laminectomy in aug, 2014. 3.  OSAS  -We talked about morbidity and mortality associated with untreated sleep apnea.  This probably contributes to EDS. he was very somnolent in the office today.   He was not able to tolerate the cpap despite trying multiple masks.

## 2013-10-28 ENCOUNTER — Telehealth: Payer: Self-pay | Admitting: Neurology

## 2013-10-28 NOTE — Telephone Encounter (Signed)
Left message on machine for patient to call back.

## 2013-10-28 NOTE — Telephone Encounter (Signed)
Message copied by Annamaria Helling on Mon Oct 28, 2013  2:27 PM ------      Message from: TAT, [place] S      Created: Fri Oct 25, 2013 12:47 PM       Just realized that pt is on amantadine for dyskinesia.  This needs to be stopped one day prior surgery. ------

## 2013-10-29 NOTE — Telephone Encounter (Signed)
Patient made aware to stop the Amantadine one day prior to surgery.

## 2013-11-25 ENCOUNTER — Encounter (HOSPITAL_COMMUNITY): Payer: Self-pay | Admitting: Pharmacy Technician

## 2013-11-28 ENCOUNTER — Ambulatory Visit (HOSPITAL_COMMUNITY)
Admission: RE | Admit: 2013-11-28 | Discharge: 2013-11-28 | Disposition: A | Discharge status: Home / Self Care | Payer: Medicare PPO | Source: Ambulatory Visit | Attending: Neurosurgery | Admitting: Neurosurgery

## 2013-11-28 DIAGNOSIS — G20 Parkinson's disease: Secondary | ICD-10-CM

## 2013-11-28 DIAGNOSIS — G20A1 Parkinson's disease without dyskinesia, without mention of fluctuations: Secondary | ICD-10-CM | POA: Insufficient documentation

## 2013-11-29 ENCOUNTER — Other Ambulatory Visit: Payer: Self-pay | Admitting: *Deleted

## 2013-11-29 ENCOUNTER — Telehealth: Payer: Self-pay | Admitting: Neurology

## 2013-11-29 MED ORDER — HYDROCHLOROTHIAZIDE 25 MG PO TABS: 25.0000 mg | ORAL_TABLET | Freq: Every day | ORAL | Status: DC

## 2013-11-29 MED ORDER — SELEGILINE HCL 5 MG PO CAPS: 5.0000 mg | ORAL_CAPSULE | Freq: Every day | ORAL | Status: DC

## 2013-11-29 MED ORDER — PRAMIPEXOLE DIHYDROCHLORIDE 0.5 MG PO TABS: 0.5000 mg | ORAL_TABLET | Freq: Three times a day (TID) | ORAL | Status: DC

## 2013-11-29 MED ORDER — PRAVASTATIN SODIUM 20 MG PO TABS: 20.0000 mg | ORAL_TABLET | Freq: Every evening | ORAL | Status: DC

## 2013-11-29 MED ORDER — GABAPENTIN 300 MG PO CAPS: 300.0000 mg | ORAL_CAPSULE | Freq: Three times a day (TID) | ORAL | Status: DC

## 2013-11-29 MED ORDER — CARBIDOPA-LEVODOPA 25-100 MG PO TABS: 2.0000 | ORAL_TABLET | Freq: Four times a day (QID) | ORAL | Status: DC

## 2013-11-29 MED ORDER — AMANTADINE HCL 100 MG PO CAPS: 100.0000 mg | ORAL_CAPSULE | Freq: Two times a day (BID) | ORAL | Status: DC

## 2013-11-29 NOTE — Telephone Encounter (Signed)
Carbidopa Levodopa, Amantidine, Selegiline, Mirapex and Gabapentin refill requested. Per last office note- patient to remain on medications. Refill approved, accept for Gabapentin, and sent to patient's pharmacy. Gabapentin an historical medication. Dr Carles Collet Faythe [name] to fill 300 mg TID?

## 2013-11-29 NOTE — Telephone Encounter (Signed)
Yes, but he should be weaning off of most of these pre-surgery.  Make sure he is as surgery is next week.

## 2013-11-29 NOTE — Telephone Encounter (Signed)
Requested Prescriptions   Signed Prescriptions Disp Refills  . pravastatin (PRAVACHOL) 20 MG tablet 90 tablet 3    Sig: Take 1 tablet (20 mg total) by mouth every evening.    Authorizing Provider: Minna Merritts    Ordering User: Britt Bottom hydrochlorothiazide (HYDRODIURIL) 25 MG tablet 90 tablet 3    Sig: Take 1 tablet (25 mg total) by mouth daily.    Authorizing Provider: Minna Merritts    Ordering User: Britt Bottom

## 2013-11-29 NOTE — Telephone Encounter (Signed)
Gabapentin RX sent. Spoke with patient and they are following the wean down schedule that has been given to them for surgery. They will call with any questions.

## 2013-11-30 NOTE — Pre-Procedure Instructions (Addendum)
[name]  11/30/2013   Your procedure is scheduled on:  August 21                   (Second procedure 12/13/13  arrive at 530)  Report to [facility] Admitting at 05:30 AM.  Call this number if you have problems the morning of surgery: [phone]   Remember:   Do not eat food or drink liquids after midnight.   Take these medicines the morning of surgery with A SIP OF WATER: Flonase, Oxycodone (if needed)   Follow Dr. Doristine Devoid instructions regarding how to take all Parkinson's medication    STOP Vitamin D and Multiple Vitamin today   STOP/ Do not take Aspirin, Aleve, Naproxen, Advil, Ibuprofen, Motrin, Vitamins, Herbs, or Supplements starting today   Do not wear jewelry, make-up or nail polish.  Do not wear lotions, powders, or perfumes. You may wear deodorant.  Do not shave 48 hours prior to surgery. Men may shave face and neck.  Do not bring valuables to the hospital.  [facility] is not responsible for any belongings or valuables.               Contacts, dentures or bridgework may not be worn into surgery.  Leave suitcase in the car. After surgery it may be brought to your room.  For patients admitted to the hospital, discharge time is determined by your treatment team.               Special Instructions: See [facility] Preparing For Surgery   Please read over the following fact sheets that you were given: Pain Booklet, Coughing and Deep Breathing and Surgical Site Infection Prevention

## 2013-12-02 ENCOUNTER — Encounter (HOSPITAL_COMMUNITY)
Admission: RE | Admit: 2013-12-02 | Discharge: 2013-12-02 | Disposition: A | Discharge status: Home / Self Care | Payer: Medicare PPO | Source: Ambulatory Visit | Attending: Neurosurgery | Admitting: Neurosurgery

## 2013-12-02 ENCOUNTER — Encounter (HOSPITAL_COMMUNITY): Payer: Self-pay

## 2013-12-02 DIAGNOSIS — Z85820 Personal history of malignant melanoma of skin: Secondary | ICD-10-CM | POA: Diagnosis not present

## 2013-12-02 DIAGNOSIS — F329 Major depressive disorder, single episode, unspecified: Secondary | ICD-10-CM | POA: Diagnosis not present

## 2013-12-02 DIAGNOSIS — G4733 Obstructive sleep apnea (adult) (pediatric): Secondary | ICD-10-CM | POA: Insufficient documentation

## 2013-12-02 DIAGNOSIS — G20 Parkinson's disease: Secondary | ICD-10-CM | POA: Insufficient documentation

## 2013-12-02 DIAGNOSIS — Z9889 Other specified postprocedural states: Secondary | ICD-10-CM | POA: Diagnosis not present

## 2013-12-02 DIAGNOSIS — M48061 Spinal stenosis, lumbar region without neurogenic claudication: Secondary | ICD-10-CM | POA: Insufficient documentation

## 2013-12-02 DIAGNOSIS — I8390 Asymptomatic varicose veins of unspecified lower extremity: Secondary | ICD-10-CM | POA: Diagnosis not present

## 2013-12-02 DIAGNOSIS — I498 Other specified cardiac arrhythmias: Secondary | ICD-10-CM | POA: Diagnosis not present

## 2013-12-02 DIAGNOSIS — I10 Essential (primary) hypertension: Secondary | ICD-10-CM | POA: Diagnosis not present

## 2013-12-02 DIAGNOSIS — G20A1 Parkinson's disease without dyskinesia, without mention of fluctuations: Secondary | ICD-10-CM | POA: Insufficient documentation

## 2013-12-02 DIAGNOSIS — F4310 Post-traumatic stress disorder, unspecified: Secondary | ICD-10-CM | POA: Diagnosis not present

## 2013-12-02 DIAGNOSIS — K219 Gastro-esophageal reflux disease without esophagitis: Secondary | ICD-10-CM | POA: Diagnosis not present

## 2013-12-02 DIAGNOSIS — M129 Arthropathy, unspecified: Secondary | ICD-10-CM | POA: Diagnosis not present

## 2013-12-02 DIAGNOSIS — F3289 Other specified depressive episodes: Secondary | ICD-10-CM | POA: Insufficient documentation

## 2013-12-02 DIAGNOSIS — G44209 Tension-type headache, unspecified, not intractable: Secondary | ICD-10-CM | POA: Insufficient documentation

## 2013-12-02 DIAGNOSIS — Z01818 Encounter for other preprocedural examination: Secondary | ICD-10-CM | POA: Diagnosis present

## 2013-12-02 HISTORY — DX: Cardiac arrhythmia, unspecified: I49.9

## 2013-12-02 HISTORY — DX: Personal history of urinary calculi: Z87.442

## 2013-12-02 LAB — CBC
HCT: 44.2 % (ref 39.0–52.0)
HEMOGLOBIN: 15 g/dL (ref 13.0–17.0)
MCH: 31.1 pg (ref 26.0–34.0)
MCHC: 33.9 g/dL (ref 30.0–36.0)
MCV: 91.7 fL (ref 78.0–100.0)
PLATELETS: 197 10*3/uL (ref 150–400)
RBC: 4.82 MIL/uL (ref 4.22–5.81)
RDW: 13.8 % (ref 11.5–15.5)
WBC: 8.4 10*3/uL (ref 4.0–10.5)

## 2013-12-02 LAB — BASIC METABOLIC PANEL
ANION GAP: 12 (ref 5–15)
BUN: 19 mg/dL (ref 6–23)
CHLORIDE: 101 meq/L (ref 96–112)
CO2: 27 mEq/L (ref 19–32)
CREATININE: 0.91 mg/dL (ref 0.50–1.35)
Calcium: 9.4 mg/dL (ref 8.4–10.5)
GFR calc Af Amer: >90 mL/min (ref >90)
GFR, EST NON AFRICAN AMERICAN: 88 mL/min — AB (ref >90)
Glucose, Bld: 91 mg/dL (ref 70–99)
Potassium: 4.1 mEq/L (ref 3.7–5.3)
Sodium: 140 mEq/L (ref 137–147)

## 2013-12-02 LAB — SURGICAL PCR SCREEN
MRSA, PCR: NEGATIVE
STAPHYLOCOCCUS AUREUS: NEGATIVE

## 2013-12-03 NOTE — Anesthesia Preprocedure Evaluation (Addendum)
Anesthesia Evaluation  Patient identified by MRN, date of birth, ID band Patient awake    Reviewed: Allergy & Precautions, H&P , NPO status , Patient's Chart, lab work & pertinent test results  Airway       Dental   Pulmonary sleep apnea ,          Cardiovascular hypertension,     Neuro/Psych  Headaches, Depression PTSDParkinson's Dz    GI/Hepatic GERD-  ,  Endo/Other    Renal/GU      Musculoskeletal  (+) Arthritis -,   Abdominal   Peds  Hematology   Anesthesia Other Findings   Reproductive/Obstetrics                          Anesthesia Physical Anesthesia Plan  ASA: III  Anesthesia Plan: General   Post-op Pain Management:    Induction: Intravenous  Airway Management Planned: Oral ETT  Additional Equipment: Arterial line  Intra-op Plan:   Post-operative Plan: Extubation in OR  Informed Consent: I have reviewed the patients History and Physical, chart, labs and discussed the procedure including the risks, benefits and alternatives for the proposed anesthesia with the patient or authorized representative who has indicated his/her understanding and acceptance.     Plan Discussed with: CRNA, Anesthesiologist and Surgeon  Anesthesia Plan Comments: (See my note.  He is on an MAO inhibitor.  Myra Gianotti, PA-C)       Anesthesia Quick Evaluation

## 2013-12-03 NOTE — Progress Notes (Signed)
Anesthesia Chart Review:  Patient is a 63 year old male scheduled for bilateral deep brain stimulator placement on 12/06/13 by Dr. Vertell Limber.  History includes Parkinson's disease, non-smoker, PTSD (veteran), depression, bradycardia, HTN, OSA without CPAP, varicose veins, arthritis, GERD, nephrolithiasis, skin cancer excision, tension headaches, L3-5 laminotomy/foraminotomy 12/14/12. Neurologist is Dr. Wells Guiles Tat. PCP is Dr. Elsie Stain. He saw cardiologist Dr. Rockey Situ for bradycardia in 05/2013. Dr. Rockey Situ did not feel that there was any indication for a pacemaker at that time as patient was relatively asymptomatic of his bradycardia.  Meds include selegiline 5 mg daily which is a MAO inhibitor. He last saw Dr. Carles Collet on 10/25/13. She was aware of plans for surgery and gave him the following instructions regarding selegiline, Mirapex, and Levodopa. Take your medications as follows prior to surgery: Take Selegiline, Mirapex and Levodopa in normal doses until 12/03/13. On this day take Levodopa and Selegiline normally but decrease Mirapex to twice this day. On 12/04/13 Take 1 Levodopa in the morning, Take 1 Mirapex in the morning, and Take 1 Selegiline in the morning. Do not take any more doses of these medications until after surgery.   EKG on 12/02/13 showed: SB at 51 bpm, LAD, poor r wave progression, cannot exclude old anterior infarct.  Possible anteroseptal infarct was cited on EKGs dating back to at least 7//7/14 [facility]).  He is scheduled for a CXR on the day of surgery.  Preoperative labs noted.    If no acute changes then I anticipate that he can proceed as planned.  I notified anesthesiologist Dr. Glennon Mac of Dr. Doristine Devoid recommendations regarding his perioperative medication, including MAOI, adjustments.   George Hugh [facility] Short Stay Center/Anesthesiology Phone [phone] 12/03/2013 5:21 PM

## 2013-12-05 ENCOUNTER — Other Ambulatory Visit: Payer: Self-pay | Admitting: Neurology

## 2013-12-05 ENCOUNTER — Other Ambulatory Visit: Payer: Self-pay

## 2013-12-05 DIAGNOSIS — G20 Parkinson's disease: Secondary | ICD-10-CM

## 2013-12-05 DIAGNOSIS — F0280 Dementia in other diseases classified elsewhere without behavioral disturbance: Secondary | ICD-10-CM

## 2013-12-05 MED ORDER — CEFAZOLIN SODIUM-DEXTROSE 2-3 GM-% IV SOLR
2.0000 g | INTRAVENOUS | Status: AC
  Administered 2013-12-06: 2 g via INTRAVENOUS
  Filled 2013-12-05: qty 50

## 2013-12-05 MED ORDER — CEFAZOLIN SODIUM-DEXTROSE 2-3 GM-% IV SOLR
2.0000 g | INTRAVENOUS | Status: AC
  Administered 2013-12-06: 2 g via INTRAVENOUS

## 2013-12-06 ENCOUNTER — Inpatient Hospital Stay (HOSPITAL_COMMUNITY): Payer: Medicare PPO | Admitting: Certified Registered"

## 2013-12-06 ENCOUNTER — Inpatient Hospital Stay (HOSPITAL_COMMUNITY): Payer: Medicare PPO

## 2013-12-06 ENCOUNTER — Encounter (HOSPITAL_COMMUNITY)
Admission: RE | Disposition: A | Discharge status: Home / Self Care | Payer: Self-pay | Source: Ambulatory Visit | Attending: Neurosurgery

## 2013-12-06 ENCOUNTER — Encounter (HOSPITAL_COMMUNITY): Payer: Self-pay | Admitting: *Deleted

## 2013-12-06 ENCOUNTER — Inpatient Hospital Stay (HOSPITAL_COMMUNITY)
Admission: RE | Admit: 2013-12-06 | Discharge: 2013-12-08 | DRG: 024 | Disposition: A | Discharge status: Home / Self Care | Payer: Medicare PPO | Source: Ambulatory Visit | Attending: Neurosurgery | Admitting: Neurosurgery

## 2013-12-06 ENCOUNTER — Encounter (HOSPITAL_COMMUNITY): Payer: Medicare PPO | Admitting: Vascular Surgery

## 2013-12-06 DIAGNOSIS — Z85828 Personal history of other malignant neoplasm of skin: Secondary | ICD-10-CM | POA: Diagnosis not present

## 2013-12-06 DIAGNOSIS — K219 Gastro-esophageal reflux disease without esophagitis: Secondary | ICD-10-CM | POA: Diagnosis present

## 2013-12-06 DIAGNOSIS — Z79899 Other long term (current) drug therapy: Secondary | ICD-10-CM

## 2013-12-06 DIAGNOSIS — G20 Parkinson's disease: Secondary | ICD-10-CM | POA: Diagnosis present

## 2013-12-06 DIAGNOSIS — F0280 Dementia in other diseases classified elsewhere without behavioral disturbance: Secondary | ICD-10-CM

## 2013-12-06 DIAGNOSIS — G20A1 Parkinson's disease without dyskinesia, without mention of fluctuations: Principal | ICD-10-CM

## 2013-12-06 DIAGNOSIS — I10 Essential (primary) hypertension: Secondary | ICD-10-CM | POA: Diagnosis present

## 2013-12-06 HISTORY — PX: SUBTHALAMIC STIMULATOR INSERTION: SHX5375

## 2013-12-06 SURGERY — SUBTHALAMIC STIMULATOR INSERTION
Anesthesia: Monitor Anesthesia Care | Laterality: Bilateral

## 2013-12-06 MED ORDER — BUPIVACAINE HCL 0.5 % IJ SOLN
INTRAMUSCULAR | Status: DC | PRN
  Administered 2013-12-06 (×2): 10 mL

## 2013-12-06 MED ORDER — HEMOSTATIC AGENTS (NO CHARGE) OPTIME
TOPICAL | Status: DC | PRN
  Administered 2013-12-06: 1 via TOPICAL

## 2013-12-06 MED ORDER — MIDAZOLAM HCL 5 MG/5ML IJ SOLN
INTRAMUSCULAR | Status: DC | PRN
  Administered 2013-12-06: 1 mg via INTRAVENOUS

## 2013-12-06 MED ORDER — ONDANSETRON HCL 4 MG/2ML IJ SOLN
INTRAMUSCULAR | Status: DC | PRN
  Administered 2013-12-06: 4 mg via INTRAVENOUS

## 2013-12-06 MED ORDER — PROPOFOL 10 MG/ML IV BOLUS
INTRAVENOUS | Status: AC
  Filled 2013-12-06: qty 20

## 2013-12-06 MED ORDER — DOCUSATE SODIUM 100 MG PO CAPS
100.0000 mg | ORAL_CAPSULE | Freq: Two times a day (BID) | ORAL | Status: DC
  Administered 2013-12-06 – 2013-12-08 (×5): 100 mg via ORAL
  Filled 2013-12-06 (×6): qty 1

## 2013-12-06 MED ORDER — 0.9 % SODIUM CHLORIDE (POUR BTL) OPTIME
TOPICAL | Status: DC | PRN
  Administered 2013-12-06: 1000 mL

## 2013-12-06 MED ORDER — CEFAZOLIN SODIUM 1-5 GM-% IV SOLN
1.0000 g | Freq: Three times a day (TID) | INTRAVENOUS | Status: AC
  Administered 2013-12-06 (×2): 1 g via INTRAVENOUS
  Filled 2013-12-06 (×2): qty 50

## 2013-12-06 MED ORDER — POTASSIUM CHLORIDE IN NACL 20-0.9 MEQ/L-% IV SOLN
INTRAVENOUS | Status: DC
  Filled 2013-12-06 (×3): qty 1000

## 2013-12-06 MED ORDER — BACITRACIN ZINC 500 UNIT/GM EX OINT
TOPICAL_OINTMENT | CUTANEOUS | Status: DC | PRN
  Administered 2013-12-06: 1 via TOPICAL

## 2013-12-06 MED ORDER — MIDAZOLAM HCL 2 MG/2ML IJ SOLN
INTRAMUSCULAR | Status: AC
  Filled 2013-12-06: qty 2

## 2013-12-06 MED ORDER — SODIUM CHLORIDE 0.9 % IJ SOLN
3.0000 mL | INTRAMUSCULAR | Status: DC | PRN
Start: 2013-12-06 — End: 2013-12-08

## 2013-12-06 MED ORDER — ALUM & MAG HYDROXIDE-SIMETH 200-200-20 MG/5ML PO SUSP: 30.0000 mL | Freq: Four times a day (QID) | ORAL | Status: DC | PRN

## 2013-12-06 MED ORDER — PHENOL 1.4 % MT LIQD: 1.0000 | OROMUCOSAL | Status: DC | PRN

## 2013-12-06 MED ORDER — HYDROMORPHONE HCL PF 1 MG/ML IJ SOLN
0.2500 mg | INTRAMUSCULAR | Status: DC | PRN
  Administered 2013-12-06: 1 mg via INTRAVENOUS

## 2013-12-06 MED ORDER — ACETAMINOPHEN 325 MG PO TABS: 650.0000 mg | ORAL_TABLET | Freq: Four times a day (QID) | ORAL | Status: DC | PRN

## 2013-12-06 MED ORDER — ONDANSETRON HCL 4 MG/2ML IJ SOLN
INTRAMUSCULAR | Status: DC
Start: 2013-12-06 — End: 2013-12-06
  Filled 2013-12-06: qty 2

## 2013-12-06 MED ORDER — AMANTADINE HCL 100 MG PO CAPS
100.0000 mg | ORAL_CAPSULE | Freq: Two times a day (BID) | ORAL | Status: DC
  Administered 2013-12-06 – 2013-12-08 (×4): 100 mg via ORAL
  Filled 2013-12-06 (×6): qty 1

## 2013-12-06 MED ORDER — FLUTICASONE PROPIONATE 50 MCG/ACT NA SUSP: 2.0000 | Freq: Every day | NASAL | Status: DC | PRN

## 2013-12-06 MED ORDER — MENTHOL 3 MG MT LOZG: 1.0000 | LOZENGE | OROMUCOSAL | Status: DC | PRN

## 2013-12-06 MED ORDER — CARBIDOPA-LEVODOPA 25-100 MG PO TABS: 2.0000 | ORAL_TABLET | Freq: Four times a day (QID) | ORAL | Status: DC

## 2013-12-06 MED ORDER — ONDANSETRON HCL 4 MG/2ML IJ SOLN: 4.0000 mg | Freq: Four times a day (QID) | INTRAMUSCULAR | Status: DC | PRN

## 2013-12-06 MED ORDER — OXYCODONE HCL 5 MG PO TABS: 5.0000 mg | ORAL_TABLET | Freq: Once | ORAL | Status: DC | PRN

## 2013-12-06 MED ORDER — LIDOCAINE-EPINEPHRINE 1 %-1:100000 IJ SOLN
INTRAMUSCULAR | Status: DC | PRN
  Administered 2013-12-06 (×2): 10 mL via INTRADERMAL

## 2013-12-06 MED ORDER — ONDANSETRON HCL 4 MG/2ML IJ SOLN: 4.0000 mg | INTRAMUSCULAR | Status: DC | PRN

## 2013-12-06 MED ORDER — NAPHAZOLINE HCL 0.1 % OP SOLN: 1.0000 [drp] | Freq: Four times a day (QID) | OPHTHALMIC | Status: DC | PRN

## 2013-12-06 MED ORDER — OXYCODONE HCL 5 MG/5ML PO SOLN: 5.0000 mg | Freq: Once | ORAL | Status: DC | PRN

## 2013-12-06 MED ORDER — HYDROCODONE-ACETAMINOPHEN 5-325 MG PO TABS: 1.0000 | ORAL_TABLET | ORAL | Status: DC | PRN

## 2013-12-06 MED ORDER — PRAMIPEXOLE DIHYDROCHLORIDE 0.125 MG PO TABS
0.5000 mg | ORAL_TABLET | Freq: Three times a day (TID) | ORAL | Status: DC
  Administered 2013-12-06 – 2013-12-08 (×7): 0.5 mg via ORAL
  Filled 2013-12-06: qty 2
  Filled 2013-12-06: qty 4
  Filled 2013-12-06 (×2): qty 2
  Filled 2013-12-06: qty 4
  Filled 2013-12-06 (×2): qty 2
  Filled 2013-12-06: qty 4

## 2013-12-06 MED ORDER — PRAMIPEXOLE DIHYDROCHLORIDE 0.25 MG PO TABS: 0.5000 mg | ORAL_TABLET | Freq: Three times a day (TID) | ORAL | Status: DC

## 2013-12-06 MED ORDER — SODIUM CHLORIDE 0.9 % IV SOLN: 250.0000 mL | INTRAVENOUS | Status: DC

## 2013-12-06 MED ORDER — CARBIDOPA-LEVODOPA 25-100 MG PO TABS
3.0000 | ORAL_TABLET | Freq: Two times a day (BID) | ORAL | Status: DC
  Administered 2013-12-06 – 2013-12-08 (×4): 3 via ORAL
  Filled 2013-12-06 (×3): qty 3
  Filled 2013-12-06: qty 2
  Filled 2013-12-06 (×2): qty 3

## 2013-12-06 MED ORDER — RAMIPRIL 2.5 MG PO CAPS
2.5000 mg | ORAL_CAPSULE | Freq: Two times a day (BID) | ORAL | Status: DC
  Administered 2013-12-06 – 2013-12-08 (×4): 2.5 mg via ORAL
  Filled 2013-12-06 (×6): qty 1

## 2013-12-06 MED ORDER — PROPOFOL 10 MG/ML IV BOLUS
INTRAVENOUS | Status: DC | PRN
  Administered 2013-12-06: 10 mg via INTRAVENOUS
  Administered 2013-12-06 (×2): 20 mg via INTRAVENOUS
  Administered 2013-12-06 (×3): 10 mg via INTRAVENOUS
  Administered 2013-12-06: 20 mg via INTRAVENOUS
  Administered 2013-12-06: 30 mg via INTRAVENOUS
  Administered 2013-12-06: 10 mg via INTRAVENOUS
  Administered 2013-12-06: 20 mg via INTRAVENOUS

## 2013-12-06 MED ORDER — MORPHINE SULFATE 2 MG/ML IJ SOLN: 1.0000 mg | INTRAMUSCULAR | Status: DC | PRN

## 2013-12-06 MED ORDER — HYDROMORPHONE HCL PF 1 MG/ML IJ SOLN
INTRAMUSCULAR | Status: AC
  Administered 2013-12-06: 1 mg via INTRAVENOUS
  Filled 2013-12-06: qty 3

## 2013-12-06 MED ORDER — SIMVASTATIN 5 MG PO TABS
10.0000 mg | ORAL_TABLET | Freq: Every day | ORAL | Status: DC
  Administered 2013-12-06 – 2013-12-07 (×2): 10 mg via ORAL
  Filled 2013-12-06: qty 2
  Filled 2013-12-06 (×2): qty 1

## 2013-12-06 MED ORDER — FENTANYL CITRATE 0.05 MG/ML IJ SOLN
INTRAMUSCULAR | Status: AC
  Filled 2013-12-06: qty 5

## 2013-12-06 MED ORDER — HYDROCHLOROTHIAZIDE 25 MG PO TABS
25.0000 mg | ORAL_TABLET | Freq: Every day | ORAL | Status: DC
  Administered 2013-12-06 – 2013-12-08 (×3): 25 mg via ORAL
  Filled 2013-12-06 (×3): qty 1

## 2013-12-06 MED ORDER — CARBIDOPA-LEVODOPA 25-100 MG PO TABS
2.0000 | ORAL_TABLET | Freq: Two times a day (BID) | ORAL | Status: DC
  Administered 2013-12-06 – 2013-12-08 (×4): 2 via ORAL
  Filled 2013-12-06 (×6): qty 2

## 2013-12-06 MED ORDER — LACTATED RINGERS IV SOLN
INTRAVENOUS | Status: DC | PRN
  Administered 2013-12-06: 07:00:00 via INTRAVENOUS

## 2013-12-06 MED ORDER — ADULT MULTIVITAMIN W/MINERALS CH
1.0000 | ORAL_TABLET | Freq: Every day | ORAL | Status: DC
Start: 2013-12-06 — End: 2013-12-08
  Administered 2013-12-06 – 2013-12-08 (×3): 1 via ORAL
  Filled 2013-12-06 (×3): qty 1

## 2013-12-06 MED ORDER — ACETAMINOPHEN 650 MG RE SUPP: 650.0000 mg | RECTAL | Status: DC | PRN

## 2013-12-06 MED ORDER — ACETAMINOPHEN 325 MG PO TABS: 650.0000 mg | ORAL_TABLET | ORAL | Status: DC | PRN

## 2013-12-06 MED ORDER — PANTOPRAZOLE SODIUM 40 MG IV SOLR
40.0000 mg | Freq: Every day | INTRAVENOUS | Status: DC
  Administered 2013-12-06: 40 mg via INTRAVENOUS
  Filled 2013-12-06 (×2): qty 40

## 2013-12-06 MED ORDER — GABAPENTIN 300 MG PO CAPS
300.0000 mg | ORAL_CAPSULE | Freq: Three times a day (TID) | ORAL | Status: DC
  Administered 2013-12-06 – 2013-12-08 (×7): 300 mg via ORAL
  Filled 2013-12-06 (×9): qty 1

## 2013-12-06 MED ORDER — ONE-DAILY MULTI VITAMINS PO TABS: 1.0000 | ORAL_TABLET | Freq: Every day | ORAL | Status: DC

## 2013-12-06 MED ORDER — SELEGILINE HCL 5 MG PO CAPS
5.0000 mg | ORAL_CAPSULE | Freq: Every day | ORAL | Status: DC
  Administered 2013-12-06: 5 mg via ORAL
  Filled 2013-12-06 (×2): qty 1

## 2013-12-06 MED ORDER — OXYCODONE-ACETAMINOPHEN 5-325 MG PO TABS
1.0000 | ORAL_TABLET | ORAL | Status: DC | PRN
  Administered 2013-12-07: 2 via ORAL
  Filled 2013-12-06: qty 2

## 2013-12-06 MED ORDER — SODIUM CHLORIDE 0.9 % IJ SOLN
3.0000 mL | Freq: Two times a day (BID) | INTRAMUSCULAR | Status: DC
  Administered 2013-12-06 – 2013-12-08 (×4): 3 mL via INTRAVENOUS

## 2013-12-06 MED ORDER — THROMBIN 5000 UNITS EX SOLR
CUTANEOUS | Status: DC | PRN
  Administered 2013-12-06 (×2): 5000 [IU] via TOPICAL

## 2013-12-06 MED ORDER — OXYCODONE-ACETAMINOPHEN 5-325 MG PO TABS
1.0000 | ORAL_TABLET | ORAL | Status: DC | PRN
  Administered 2013-12-06: 2 via ORAL
  Filled 2013-12-06: qty 2

## 2013-12-06 MED ORDER — VITAMIN D 1000 UNITS PO TABS
1000.0000 [IU] | ORAL_TABLET | Freq: Every day | ORAL | Status: DC
  Administered 2013-12-06 – 2013-12-08 (×3): 1000 [IU] via ORAL
  Filled 2013-12-06 (×3): qty 1

## 2013-12-06 MED ORDER — SENNA 8.6 MG PO TABS
1.0000 | ORAL_TABLET | Freq: Two times a day (BID) | ORAL | Status: DC
  Administered 2013-12-06 – 2013-12-08 (×4): 8.6 mg via ORAL
  Filled 2013-12-06 (×4): qty 1

## 2013-12-06 SURGICAL SUPPLY — 73 items
BANDAGE ADH SHEER 1  50/CT (GAUZE/BANDAGES/DRESSINGS) IMPLANT
BENZOIN TINCTURE PRP APPL 2/3 (GAUZE/BANDAGES/DRESSINGS) ×2 IMPLANT
BIT DRILL NEURO 2X3.1 SFT TUCH (MISCELLANEOUS) IMPLANT
BLADE SURG 11 STRL SS (BLADE) ×2 IMPLANT
BLADE SURG ROTATE 9660 (MISCELLANEOUS) ×2 IMPLANT
BNDG GAUZE ELAST 4 BULKY (GAUZE/BANDAGES/DRESSINGS) IMPLANT
BOOT SUTURE AID YELLOW STND (SUTURE) ×4 IMPLANT
CABLE MER (MISCELLANEOUS) ×2 IMPLANT
CANISTER SUCT 3000ML (MISCELLANEOUS) ×2 IMPLANT
CLIP RANEY DISP (INSTRUMENTS) ×6 IMPLANT
CONT SPEC 4OZ CLIKSEAL STRL BL (MISCELLANEOUS) ×4 IMPLANT
DRAPE POUCH INSTRU U-SHP 10X18 (DRAPES) ×2 IMPLANT
DRAPE STERI IOBAN 125X83 (DRAPES) ×4 IMPLANT
DRILL NEURO 2X3.1 SOFT TOUCH (MISCELLANEOUS)
DRSG OPSITE 4X5.5 SM (GAUZE/BANDAGES/DRESSINGS) ×8 IMPLANT
DRSG TELFA 3X8 NADH (GAUZE/BANDAGES/DRESSINGS) ×2 IMPLANT
DURAPREP 26ML APPLICATOR (WOUND CARE) ×2 IMPLANT
DURAPREP 6ML APPLICATOR 50/CS (WOUND CARE) IMPLANT
DURASEAL APPLICATOR TIP (TIP) ×6 IMPLANT
DURASEAL SPINE SEALANT 3ML (MISCELLANEOUS) ×4 IMPLANT
ELECT CAUTERY BLADE 6.4 (BLADE) IMPLANT
GAUZE SPONGE 4X4 12PLY STRL (GAUZE/BANDAGES/DRESSINGS) IMPLANT
GAUZE SPONGE 4X4 16PLY XRAY LF (GAUZE/BANDAGES/DRESSINGS) IMPLANT
GLOVE BIO SURGEON STRL SZ8 (GLOVE) ×8 IMPLANT
GLOVE BIOGEL PI IND STRL 7.5 (GLOVE) ×1 IMPLANT
GLOVE BIOGEL PI IND STRL 8 (GLOVE) ×1 IMPLANT
GLOVE BIOGEL PI IND STRL 8.5 (GLOVE) ×1 IMPLANT
GLOVE BIOGEL PI INDICATOR 7.5 (GLOVE) ×1
GLOVE BIOGEL PI INDICATOR 8 (GLOVE) ×1
GLOVE BIOGEL PI INDICATOR 8.5 (GLOVE) ×1
GLOVE ECLIPSE 7.5 STRL STRAW (GLOVE) ×6 IMPLANT
GLOVE ECLIPSE 8.0 STRL XLNG CF (GLOVE) ×2 IMPLANT
GLOVE EXAM NITRILE LRG STRL (GLOVE) IMPLANT
GLOVE EXAM NITRILE MD LF STRL (GLOVE) IMPLANT
GLOVE EXAM NITRILE XL STR (GLOVE) IMPLANT
GLOVE EXAM NITRILE XS STR PU (GLOVE) IMPLANT
GLOVE INDICATOR 8.5 STRL (GLOVE) ×4 IMPLANT
GOWN STRL REUS W/ TWL LRG LVL3 (GOWN DISPOSABLE) IMPLANT
GOWN STRL REUS W/ TWL XL LVL3 (GOWN DISPOSABLE) ×2 IMPLANT
GOWN STRL REUS W/TWL 2XL LVL3 (GOWN DISPOSABLE) ×4 IMPLANT
GOWN STRL REUS W/TWL LRG LVL3 (GOWN DISPOSABLE)
GOWN STRL REUS W/TWL XL LVL3 (GOWN DISPOSABLE) ×2
IOM DBS CASE ×2 IMPLANT
KIT ACCESSORY (KITS) ×1
KIT ACCESSORY NEUROSURG SU (KITS) ×1 IMPLANT
KIT BASIN OR (CUSTOM PROCEDURE TRAY) ×2 IMPLANT
KIT ROOM TURNOVER OR (KITS) ×2 IMPLANT
LEAD DBS (Neuro Prosthesis/Implant) ×4 IMPLANT
MARKER SKIN DUAL TIP RULER LAB (MISCELLANEOUS) ×4 IMPLANT
NEEDLE HYPO 25X1 1.5 SAFETY (NEEDLE) ×2 IMPLANT
NEEDLE SPNL 18GX3.5 QUINCKE PK (NEEDLE) IMPLANT
NEEDLE SPNL 22GX3.5 QUINCKE BK (NEEDLE) IMPLANT
NS IRRIG 1000ML POUR BTL (IV SOLUTION) ×2 IMPLANT
PACK LAMINECTOMY NEURO (CUSTOM PROCEDURE TRAY) ×2 IMPLANT
PAD ARMBOARD 7.5X6 YLW CONV (MISCELLANEOUS) ×4 IMPLANT
PASSIVE HEADREST ACCESSORY KIT ×2 IMPLANT
PERFORATOR LRG  14-11MM (BIT) ×1
PERFORATOR LRG 14-11MM (BIT) ×1 IMPLANT
PLATEFORM ARRAY DZAP LEADPOINT (MISCELLANEOUS) ×1
PLATFORM ARRAY DZAP LEADPOINT (MISCELLANEOUS) ×1 IMPLANT
PLATFORM BILATERAL SEQUENTIAL (MISCELLANEOUS) ×2 IMPLANT
SET SINGLE IT STRL (KITS) ×2 IMPLANT
SPONGE SURGIFOAM ABS GEL SZ50 (HEMOSTASIS) ×2 IMPLANT
STAPLER SKIN PROX WIDE 3.9 (STAPLE) ×2 IMPLANT
SUT ETHILON 3 0 PS 1 (SUTURE) IMPLANT
SUT SILK 2 0 TIES 10X30 (SUTURE) ×2 IMPLANT
SUT VIC AB 2-0 CP2 18 (SUTURE) ×6 IMPLANT
SYR CONTROL 10ML LL (SYRINGE) ×2 IMPLANT
TOWEL OR 17X24 6PK STRL BLUE (TOWEL DISPOSABLE) ×2 IMPLANT
TOWEL OR 17X26 10 PK STRL BLUE (TOWEL DISPOSABLE) ×2 IMPLANT
TRAY FOLEY CATH 14FRSI W/METER (CATHETERS) IMPLANT
TUBE CONNECTING 12X1/4 (SUCTIONS) ×2 IMPLANT
WATER STERILE IRR 1000ML POUR (IV SOLUTION) ×2 IMPLANT

## 2013-12-06 NOTE — OR Nursing (Signed)
[employer] Stimloc [employer] Covers Right: Exp. 07/08/2016; Lot: [id number]  Left: Exp. 07/08/2016; Lot: [id number]  Both Charged under a kit in supplies.

## 2013-12-06 NOTE — Op Note (Signed)
12/06/2013  12:13 PM  PATIENT:  [name]  63 y.o. male  PRE-OPERATIVE DIAGNOSIS:  Parkinsons  POST-OPERATIVE DIAGNOSIS:  Parkinsons  PROCEDURE:  Procedure(s) with comments: SUBTHALAMIC STIMULATOR INSERTION (Bilateral) - Bilateral deep brain stimulator placement  SURGEON:  Surgeon(s) and Role:    * Erline Levine, MD - Primary    * Eustace Quail Tat, DO - Assisting    * Elaina Hoops, MD - Assisting  PHYSICIAN ASSISTANT:   ASSISTANTS: Poteat, RN   ANESTHESIA:   IV sedation  EBL:  Total I/O In: 700 [I.V.:700] Out: 39 [Blood:50]  BLOOD ADMINISTERED:none  DRAINS: none   LOCAL MEDICATIONS USED:  LIDOCAINE   SPECIMEN:  No Specimen  DISPOSITION OF SPECIMEN:  N/A  COUNTS:  YES  TOURNIQUET:  * No tourniquets in log *  DICTATION: DICTATION:   Indications: Patient is a 63 year old man with Parkinson's Disease it was elected to take him to surgery for bilateral STN deep brain stimulator electrode placement  Procedure: Preoperative planing was performed with volumetricCT and placement of 4 fiducial markers in the skull followed by CT of the brain also obtained volumetrically. These were then exported to create Starfix head frame with planned targeting of bilateral subthalamic nucleus electrodes was performed. The patient was brought to the operating room and placed in a semi-Fowler's position with his neck and stabilized in a neck holder. This was affixed to the [place] adapter. His scalp was then prepped with DuraPrep and subsequently draped with an Ioban drape.with the fiducials and the skin was infiltrated with local lidocaine.  The areas of planned incision were then infiltrated with local lidocaine with epinephrine. The stepoffs were connected and the Starfix frame was assembled.  The entry points were then marked.  2 curvilinear incisions were made centered on the bilateral entry points. An elevator was used to clear pericranium from the skull. The perforator it was then used to  produce two 14 mm bur holes. The dura was coagulated with bipolar electrocautery. Initially we operated on the left and subsequently on the right. After opening the dura and a localizing the entry point the stylette and outer cannula were inserted into the brain. Duraseal was placed to prevent CSF leakage at each entry site. Microelectrode recordings were then performed and  we had very good recordings from the left STN. Subsequently the stimulating electrode was placed and the patient had significant improvement in bradykinesia and rigidity on the right side of the body without significant side effects to higher voltages. The electrode was then locked into position with the stimlock cap. The redundant electrode was circularized and tunneled under the scalp. The assembly was reassembled on the right.  Initial trajectory had STN, but for shorter length and at lower entry point, so we moved anterior 54mm. There was good microelectrode recordings for 5 mm of STN and after placing the stimulating electrode the patient had excellent control of tremor on the left side of her body.  We therefore elected to place this electrode and locked into position and the stereotactic assembly was disassembled. The electrode was tunneled to the right and then into the posterior scalp and locked into position. The wounds were then irrigated and closed with 2-0 Vicryl sutures and staples. The fiducials were removed and staples were placed over each of these sites. The head was washed and then sterile occlusive dressings were placed. The patient was taken to recovery having tolerated her procedure without difficulty or untoward effect. Please refer to detailed microelectrode recordings  from Dr. Carles Collet for more specifics of the positioning of the electrodes. These are included in her Epic note from the detailed neural monitoring and physical exam assessment during the surgery.  PLAN OF CARE: Admit to inpatient   PATIENT DISPOSITION:  PACU  - hemodynamically stable.   Delay start of Pharmacological VTE agent (>24hrs) due to surgical blood loss or risk of bleeding: yes

## 2013-12-06 NOTE — Brief Op Note (Signed)
12/06/2013  12:13 PM  PATIENT:  [name]  63 y.o. male  PRE-OPERATIVE DIAGNOSIS:  Parkinsons  POST-OPERATIVE DIAGNOSIS:  Parkinsons  PROCEDURE:  Procedure(s) with comments: SUBTHALAMIC STIMULATOR INSERTION (Bilateral) - Bilateral deep brain stimulator placement  SURGEON:  Surgeon(s) and Role:    * Erline Levine, MD - Primary    * Eustace Quail Tat, DO - Assisting    * Elaina Hoops, MD - Assisting  PHYSICIAN ASSISTANT:   ASSISTANTS: Poteat, RN   ANESTHESIA:   IV sedation  EBL:  Total I/O In: 700 [I.V.:700] Out: 75 [Blood:50]  BLOOD ADMINISTERED:none  DRAINS: none   LOCAL MEDICATIONS USED:  LIDOCAINE   SPECIMEN:  No Specimen  DISPOSITION OF SPECIMEN:  N/A  COUNTS:  YES  TOURNIQUET:  * No tourniquets in log *  DICTATION: DICTATION:   Indications: Patient is a 63 year old man with Parkinson's Disease it was elected to take him to surgery for bilateral STN deep brain stimulator electrode placement  Procedure: Preoperative planing was performed with volumetricCT and placement of 4 fiducial markers in the skull followed by CT of the brain also obtained volumetrically. These were then exported to create Starfix head frame with planned targeting of bilateral subthalamic nucleus electrodes was performed. The patient was brought to the operating room and placed in a semi-Fowler's position with his neck and stabilized in a neck holder. This was affixed to the [place] adapter. His scalp was then prepped with DuraPrep and subsequently draped with an Ioban drape.with the fiducials and the skin was infiltrated with local lidocaine.  The areas of planned incision were then infiltrated with local lidocaine with epinephrine. The stepoffs were connected and the Starfix frame was assembled.  The entry points were then marked.  2 curvilinear incisions were made centered on the bilateral entry points. An elevator was used to clear pericranium from the skull. The perforator it was then used to  produce two 14 mm bur holes. The dura was coagulated with bipolar electrocautery. Initially we operated on the left and subsequently on the right. After opening the dura and a localizing the entry point the stylette and outer cannula were inserted into the brain. Duraseal was placed to prevent CSF leakage at each entry site. Microelectrode recordings were then performed and  we had very good recordings from the left STN. Subsequently the stimulating electrode was placed and the patient had significant improvement in bradykinesia and rigidity on the right side of the body without significant side effects to higher voltages. The electrode was then locked into position with the stimlock cap. The redundant electrode was circularized and tunneled under the scalp. The assembly was reassembled on the right.  Initial trajectory had STN, but for shorter length and at lower entry point, so we moved anterior 66mm. There was good microelectrode recordings for 5 mm of STN and after placing the stimulating electrode the patient had excellent control of tremor on the left side of her body.  We therefore elected to place this electrode and locked into position and the stereotactic assembly was disassembled. The electrode was tunneled to the right and then into the posterior scalp and locked into position. The wounds were then irrigated and closed with 2-0 Vicryl sutures and staples. The fiducials were removed and staples were placed over each of these sites. The head was washed and then sterile occlusive dressings were placed. The patient was taken to recovery having tolerated her procedure without difficulty or untoward effect. Please refer to detailed microelectrode recordings  from Dr. Carles Collet for more specifics of the positioning of the electrodes. These are included in her Epic note from the detailed neural monitoring and physical exam assessment during the surgery.  PLAN OF CARE: Admit to inpatient   PATIENT DISPOSITION:  PACU  - hemodynamically stable.   Delay start of Pharmacological VTE agent (>24hrs) due to surgical blood loss or risk of bleeding: yes

## 2013-12-06 NOTE — Progress Notes (Signed)
Patient ID: [name], male   DOB: [birth year], 63 y.o.   MRN: [id number]  Awakens to voice, reporting headache. PEARL, MAEW, fluent with speech. Drsgs intact, dry. Falls asleep quickly, but awakens to voice. Reports 2hrs sleep last night.  BP staying high post-op, HR 38-51 (without change).   HCTZ 25mg  daily per home meds. Will discuss prn rx for BP with DrStern. Discussed with pt & family, post-op care at home in days to come. Ok to remove drsg, cleanse scalp gently with mild soap & water, leave open to air. Staples will be removed next Friday in OR. George Prime RN BSN  Orders rec'd & entered into Epic.  Home HCTZ dose now, then Altace 2.5mg  PO BID starting tonight, continuing tomorrow if BP remains high. George Prime RN BSN

## 2013-12-06 NOTE — Progress Notes (Signed)
OT Cancellation Note  Patient Details Name: [name] MRN: [id number] DOB: Sep 03, 1950   Cancelled Treatment:    Reason Eval/Treat Not Completed: Fatigue/lethargy limiting ability to participate;Other (comment)Pt sleepy from surgery. Will assess needs in am.  [place], OTR/L  [phone] 12/06/2013 12/06/2013, 4:05 PM

## 2013-12-06 NOTE — H&P (Signed)
[address], [place] [address] Phone: [phone]   Patient ID:   [id number] Patient: [name]  Date of Birth: 10/19/1950 Visit Type: Office Visit   Date: 09/25/2013 09:15 AM Provider: Marchia Meiers. Vertell Limber MD   This 63 year old male presents for Follow Up of Parkinson's Disease.  History of Present Illness: 1.  Follow Up of Parkinson's Disease  [name], a patient of Dr. Marchelle Folks having had lumbar laminectomy in August of 2014, visits to see Dr. Vertell Limber on Dr. Doristine Devoid referral.  He reports that he was dx'ed with Parkinsons disease 110yrs ago & his speech & swallowing have become very problematic. He is interested in Deep Brain Stimulation.   Recently - skin cancer removed left upper arm & right lateral calf - healing well.  Mild Htn managed with HTCZ.  MRI not yet scheduled.  Patient has right greater than left-sided rigidity.  He has reviewed the record for and DVTs regarding deep brain stimulation for Parkinson's disease.  He has had a psychological evaluation.  He has had 15 years of Parkinson's.  He notes problems with speech and swallowing as well as rigidity.  He also notes problems with on and off.  He is interested in pursuing deep brain stimulation for Parkinson's.      Medical/Surgical/Interim History Reviewed, no change.    PAST MEDICAL HISTORY, SURGICAL HISTORY, FAMILY HISTORY, SOCIAL HISTORY AND REVIEW OF SYSTEMS I have reviewed the patient's past medical, surgical, family and social history as well as the comprehensive review of systems as included on the [facility] history form dated 11/22/2012, which I have signed.  Family History: Reviewed, no changes.   Patient reports there is no relevant family history.   Social History: Tobacco use reviewed. Reviewed, no changes.     MEDICATIONS(added, continued or stopped this visit):   Started Medication Directions Instruction Stopped   carbidopa ER 25  mg-levodopa 100 mg tablet,extended release      gabapentin 300 mg capsule      hydrochlorothiazide 25 mg tablet      naproxen 500 mg tablet      oxycodone-acetaminophen 5 mg-325 mg tablet      pramipexole 0.5 mg tablet      pravastatin 20 mg tablet      selegiline 5 mg capsule       ALLERGIES:  Ingredient Reaction Medication Name Comment  NO KNOWN ALLERGIES     No known allergies. Reviewed, no changes.   Vitals Date Temp F BP Pulse Ht In Wt Lb BMI BSA Pain Score  09/25/2013  127/73 40 69 177 26.14  0/10     PHYSICAL EXAM General Level of Distress: no acute distress Overall Appearance: normal  Head and Face  Right Left  Fundoscopic Exam:  normal normal    Cardiovascular Cardiac: regular rate and rhythm without murmur  Right Left  Carotid Pulses: normal normal  Respiratory Lungs: clear to auscultation  Neurological Orientation: normal Recent and Remote Memory: normal Attention Span and Concentration:   normal Language: normal Fund of Knowledge: normal  Right Left Sensation: normal normal Upper Extremity Coordination: normal normal  Lower Extremity Coordination: normal normal  Musculoskeletal Gait and Station: normal  Right Left Upper Extremity Muscle Strength: normal normal Lower Extremity Muscle Strength: normal normal Upper Extremity Muscle Tone:  normal normal Lower Extremity Muscle Tone: normal normal  Motor Strength Upper and lower extremity motor strength was tested in the clinically pertinent muscles.  Deep Tendon Reflexes  Right Left Biceps: normal normal Triceps: normal normal Brachiloradialis: normal normal Patellar: normal normal Achilles: normal normal  Cranial Nerves II. Optic Nerve/Visual Fields: normal III. Oculomotor: normal IV. Trochlear: normal V. Trigeminal: normal VI. Abducens: normal VII. Facial: normal VIII. Acoustic/Vestibular: normal IX. Glossopharyngeal: normal X. Vagus: normal XI. Spinal  Accessory: normal XII. Hypoglossal: normal  Motor and other Tests Lhermittes: negative Rhomberg: negative Pronator drift: absent     Right Left Hoffman's: normal normal Clonus: normal normal Babinski: normal normal   Additional Findings:  Patient has rigidity right greater than left with mild tremor on today's evaluation.      IMPRESSION Patient has rigidity bradykinesia and tremor and appears to have right greater than left-sided symptoms symptoms.  He wishes to go ahead with deep brain stimulation.  Completed Orders (this encounter) Order Details Reason Side Interpretation Result Initial Treatment Date Region  Lifestyle education regarding diet Encouraged to eat a well balanced diet and follow up with primary care physician.         Assessment/Plan # Detail Type Description   1. Assessment Parkinson disease (332.0).       2. Assessment BMI 26.0-26.9,ADULT (V85.22).   Plan Orders Today's instructions / counseling include(s) Lifestyle education regarding diet.         Pain Assessment/Treatment Pain Scale: 0/10. Method: Numeric Pain Intensity Scale.  We will coordinate with Dr. Loni Muse tentatively we are looking at bilateral DBS on 7/28 or 7/30 with fiducials placed the week prior.  Orders: Instruction(s)/Education: Assessment Instruction  V85.22 Lifestyle education regarding diet             Provider:  Marchia Meiers. Vertell Limber MD  09/28/2013 04:17 PM Dictation edited by: Marchia Meiers. Vertell Limber    CC Providers: Earnie Larsson MD [address] [place], [place] [address] ----------------------------------------------------------------------------------------------------------------------------------------------------------------------         Electronically signed by Marchia Meiers. Vertell Limber MD on 09/28/2013 04:17 PM

## 2013-12-06 NOTE — Interval H&P Note (Signed)
History and Physical Interval Note:  12/06/2013 6:05 AM  [name]  has presented today for surgery, with the diagnosis of Parkinson  The various methods of treatment have been discussed with the patient and family. After consideration of risks, benefits and other options for treatment, the patient has consented to  Procedure(s) with comments: SUBTHALAMIC STIMULATOR INSERTION (Bilateral) - Bilateral deep brain stimulator placement as a surgical intervention .  The patient's history has been reviewed, patient examined, no change in status, stable for surgery.  I have reviewed the patient's chart and labs.  Questions were answered to the patient's satisfaction.     Rushi Chasen D

## 2013-12-06 NOTE — Progress Notes (Signed)
Patient up and out of bed this evening.  Progressing well.

## 2013-12-06 NOTE — Transfer of Care (Signed)
Immediate Anesthesia Transfer of Care Note  Patient: [name]  Procedure(s) Performed: Procedure(s) with comments: SUBTHALAMIC STIMULATOR INSERTION (Bilateral) - Bilateral deep brain stimulator placement  Patient Location: PACU  Anesthesia Type:MAC  Level of Consciousness: awake, alert , oriented and sedated  Airway & Oxygen Therapy: Patient Spontanous Breathing and Patient connected to nasal cannula oxygen  Post-op Assessment: Report given to PACU RN, Post -op Vital signs reviewed and stable and Patient moving all extremities  Post vital signs: Reviewed and stable  Complications: No apparent anesthesia complications

## 2013-12-06 NOTE — Progress Notes (Signed)
Utilization review completed.  

## 2013-12-06 NOTE — Progress Notes (Signed)
Awake, sleepy, but arousable.  MAEW.  Doing well. To get MRI and if OK transfer to 3500.

## 2013-12-06 NOTE — Procedures (Signed)
Preoperative diagnosis:  332.0 Postoperative diagnosis:  332.0 Surgeon:  Erline Levine Neurologist:  Wells Guiles [place]  CPT codes: [phone] (first hour) [phone] (for each additional hour) Indication for procedure: Determination of optimal electrode position for deep brain stimulation therapy. Complications: None   Description of procedure:  Following the incision and burr hole placement, a recording microelectrode was slowly advanced into the brain via a motorized drive.  Beginning approximately 10 mm above the target, recordings were periodically made, and the resultant brain activity was described on the neurophysiologic recording worksheet.  Relevant samples of the recordings were saved for subsequent off line analysis.  A total of one recording pass was obtained on the left side of the brain.    5 mm of STN were recorded from the left side of the brain.  The sensory-motor sub-region of the STN was identified.  Following the recording, the recording electrode was replaced by a stimulating electrode by Dr. Vertell Limber.  Test stimulations were then obtained.  Active contacts that were used and the response to stimulation, including both adverse effects and therapeutic effects, were recorded on the neurophysiologic stimulation worksheet.  In brief, there was complete resolution of rigidity, and bradykinesia and therapeutic voltages.  Adverse effects only occurred at supratherapeutic voltages and consisted of transient paresthesias.  A target of mirror image of the initial side was selected for the contralateral side and 2 recording passes were obtained on the right side of the brain.  4.5 mm of STN were recorded from the right side of the brain.  The sensory-motor subregion of the STN was identified.  Tremor cells and response to passive stimulation were noted.  The recording electrode was again replaced by a stimulating electrode and test stimulations were obtained.  Again, there was resolution of parkinsonian  symptoms at therapeutic voltages with no side effects occurring at therapeutic voltages.   At supratherapeutic voltages, some side effects were noted such as transient paresthesias.  Final electrode position was determined and the electrode was secured in place by Dr. Vertell Limber on each side.  Alonza Bogus, DO [facility] Director of Movement Disorders

## 2013-12-07 MED ORDER — SELEGILINE HCL 5 MG PO TABS
5.0000 mg | ORAL_TABLET | Freq: Every day | ORAL | Status: DC
  Administered 2013-12-07 – 2013-12-08 (×2): 5 mg via ORAL
  Filled 2013-12-07 (×2): qty 1

## 2013-12-07 MED ORDER — PANTOPRAZOLE SODIUM 40 MG PO TBEC
40.0000 mg | DELAYED_RELEASE_TABLET | Freq: Every day | ORAL | Status: DC
Start: 2013-12-07 — End: 2013-12-08
  Administered 2013-12-07: 40 mg via ORAL
  Filled 2013-12-07: qty 1

## 2013-12-07 NOTE — Evaluation (Signed)
Physical Therapy Evaluation Patient Details Name: [name] MRN: [id number] DOB: [birth year] Today's Date: 12/07/2013   History of Present Illness  Pt is 63 y/o male with medical history of Parkinsons Disease and admitted for s/p SUBTHALAMIC STIMULATOR INSERTION (Bilateral) - Bilateral deep brain stimulator placement  Clinical Impression  Patient is s/p Bilateral deep brain stimulator placement surgery resulting in functional limitations due to the deficits listed below (see PT Problem List). Pt limited on evaluation due to overall lethargy.   Patient will benefit from skilled PT to increase their independence and safety with mobility to allow discharge to the venue listed below.     Follow Up Recommendations No PT follow up;Supervision/Assistance - 24 hour (depending on progress and pt's lethargy due not anticipate post acute PT needs)    Equipment Recommendations  None recommended by PT    Recommendations for Other Services       Precautions / Restrictions Precautions Precautions: Fall Restrictions Weight Bearing Restrictions: No      Mobility  Bed Mobility Overal bed mobility: Needs Assistance Bed Mobility: Supine to Sit     Supine to sit: Min assist     General bed mobility comments: Assistance to elevate trunk OOB with VCs for hand placement  Transfers Overall transfer level: Needs assistance Equipment used: 1 person hand held assist Transfers: Sit to/from Stand Sit to Stand: Min assist         General transfer comment: Assistance to maintain balance due to lethargy   Ambulation/Gait Ambulation/Gait assistance: Min assist Ambulation Distance (Feet): 100 Feet Assistive device: 1 person hand held assist Gait Pattern/deviations: Shuffle;Drifts right/left Gait velocity: decreased   General Gait Details: Pt with unsteady gait with swaying laterally and needs minimal assistance to maintain balance.   Stairs            Wheelchair Mobility     Modified Rankin (Stroke Patients Only)       Balance Overall balance assessment: Needs assistance Sitting-balance support: Feet supported Sitting balance-Leahy Scale: Good     Standing balance support: Single extremity supported Standing balance-Leahy Scale: Fair                               Pertinent Vitals/Pain Pain Assessment:  (Unable to rate due to lethargy)    Home Living Family/patient expects to be discharged to:: Private residence Living Arrangements: Spouse/significant other Available Help at Discharge: Family Type of Home: House Home Access: Ramped entrance     Home Layout: Two level;Able to live on main level with bedroom/bathroom Home Equipment: Gilford Rile - 2 wheels;Shower seat;Hand held shower head;Grab bars - tub/shower (elevated toilet seat) Additional Comments: Pt lives in one story home with basement.  However pt does go to basement which where shop is located.     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      Hand Dominance   Dominant Hand: Right    Extremity/Trunk Assessment   Upper Extremity Assessment: Defer to OT evaluation           Lower Extremity Assessment: Generalized weakness         Communication   Communication: Expressive difficulties (due to lethargy)  Cognition Arousal/Alertness: Lethargic Behavior During Therapy: Flat affect Overall Cognitive Status: Impaired/Different from baseline Area of Impairment: Attention   Current Attention Level: Focused  General Comments: Pt keeps eyes closed through most of evaluation and needs occasional sternal rub to open eyes on side of bed.     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 PT Goal Formulation: With patient/family Time For Goal Achievement: 12/14/13 Potential to Achieve Goals: Good    Frequency Min 4X/week   Barriers to discharge        Co-evaluation               End of Session Equipment Utilized During Treatment: Gait belt Activity Tolerance: Patient limited by lethargy Patient left: in chair;with call bell/phone within reach;with family/visitor present Nurse Communication: Mobility status         Time: 2505-3976 PT Time Calculation (min): 24 min   Charges:   PT Evaluation $Initial PT Evaluation Tier I: 1 Procedure PT Treatments $Gait Training: 8-22 mins   PT G Codes:          Taffie Eckmann 12-29-2013, 9:03 AM  Antoine Poche, PT DPT [phone]

## 2013-12-07 NOTE — Progress Notes (Signed)
Subjective: Patient reports Patient awake slightly lethargic to have some difficulty with lethargy overnight difficulty with balance and ambulation  Objective: Vital signs in last 24 hours: Temp:  [97.5 F (36.4 C)-99 F (37.2 C)] 98.9 F (37.2 C) (08/22 0751) Pulse Rate:  [38-66] 48 (08/22 0751) Resp:  [6-27] 16 (08/22 0751) BP: (131-194)/(63-85) 131/64 mmHg (08/22 0751) SpO2:  [95 %-100 %] 97 % (08/22 0751)  Intake/Output from previous day: 08/21 0701 - 08/22 0700 In: 750 [I.V.:700; IV Piggyback:50] Out: 950 [Urine:900; Blood:50] Intake/Output this shift:    Awake left-sided tremor Mou moves all extremities well  Lab Results: No results found for this basename: WBC, HGB, HCT, PLT,  in the last 72 hours BMET No results found for this basename: NA, K, CL, CO2, GLUCOSE, BUN, CREATININE, CALCIUM,  in the last 72 hours  Studies/Results: Dg Chest 2 View  12/06/2013   CLINICAL DATA:  Preop brain surgery.  History of hypertension.  EXAM: CHEST  2 VIEW  COMPARISON:  None.  FINDINGS: Generous heart size without definite cardiomegaly. Negative aortic contours for age. There is no edema, consolidation, effusion, or pneumothorax.  IMPRESSION: No active cardiopulmonary disease.   Electronically Signed   By: Jorje Guild M.D.   On: 12/06/2013 06:38   Mr Brain Wo Contrast  12/06/2013   CLINICAL DATA:  Status post deep brain stimulator lead placement.  EXAM: MRI HEAD WITHOUT CONTRAST  TECHNIQUE: Multiplanar, multiecho pulse sequences of the brain and surrounding structures were obtained without intravenous contrast.  COMPARISON:  MR brain 10/21/2013.  Preop CT head 11/28/2013.  FINDINGS: BILATERAL subthalamic nuclei electrodes have been placed from RIGHT and LEFT frontal burr holes. The positioning appears symmetric, and there are no areas of apparent complication. The leads appear extraventricular. No extra-axial fluid collections are observed.  IMPRESSION: Apparent satisfactory BILATERAL deep  brain stimulator electrode placement in the subthalamic nuclei. No visible postoperative complication.   Electronically Signed   By: Rolla Flatten M.D.   On: 12/06/2013 14:11    Assessment/Plan: We'll continue observational more day transfer to 4 N.  LOS: 1 day     Starasia Sinko P 12/07/2013, 9:27 AM

## 2013-12-07 NOTE — Progress Notes (Signed)
Patient is a transfer from 3500, patient is lethargic. Wife at the bed side. Will continue to monitor.

## 2013-12-07 NOTE — Progress Notes (Signed)
OT Cancellation Note  Patient Details Name: [name] MRN: [id number] DOB: December 29, 1950   Cancelled Treatment:    Reason Eval/Treat Not Completed: Patient's level of consciousness. Ot to hold at this time and reattempt at a more appropriate time.   Peri Maris Pager: [phone]  12/07/2013, 9:07 AM

## 2013-12-07 NOTE — Progress Notes (Signed)
Patient's wife is concerned about the right side of the face been swollen towards the ear, on assessment, it looked a little swollen probable from the surgery, tried to let the family know it could be from the manipulations from the surgery, but they want the MD know about, MD notified.

## 2013-12-07 NOTE — Care Management Note (Signed)
    Page 1 of 1   12/07/2013     9:26:27 AM CARE MANAGEMENT NOTE 12/07/2013  Patient:  [name], [name]   Account Number:  [id number]  Date Initiated:  12/07/2013  Documentation initiated by:  [facility]  Subjective/Objective Assessment:   adm:  Parkinsons/SUBTHALAMIC STIMULATOR INSERTION (Bilateral) - Bilateral deep brain stimulator placement     Action/Plan:   discharge planning   Anticipated DC Date:  12/07/2013   Anticipated DC Plan:  HOME/SELF CARE         Choice offered to / List presented to:             Status of service:  Completed, signed off Medicare Important Message given?   (If response is "NO", the following Medicare IM given date fields will be blank) Date Medicare IM given:   Medicare IM given by:   Date Additional Medicare IM given:   Additional Medicare IM given by:    Discharge Disposition:  HOME/SELF CARE  Per UR Regulation:    If discussed at Long Length of Stay Meetings, dates discussed:    Comments:  12/07/13 09:20 CM notes no PT/OT recc for follow up therapy. No other CM needs were communicated.  Mariane Masters, BSN, CM [phone].

## 2013-12-08 MED ORDER — OXYCODONE-ACETAMINOPHEN 5-325 MG PO TABS: 1.0000 | ORAL_TABLET | ORAL | Status: DC | PRN

## 2013-12-08 NOTE — Discharge Summary (Signed)
Physician Discharge Summary  Patient ID: [name] MRN: [id number] DOB/AGE: 64-Mar-1952 64 y.o.  Admit date: 12/06/2013 Discharge date: 12/08/2013  Admission Diagnoses:parkinsonism  Discharge Diagnoses: same Active Problems:   Parkinson disease   Discharged Condition: good  Hospital Course: patient is a hospital and bilateral subthalamic nuclei implants postop patient did very well recovered in the floor on the floor was convalescing well and living and voiding however was very somnolent his first postoperative day was kept an additional day but postop day 2. Be discharged home  Consults: Significant Diagnostic Studies: Treatments:bilateral STN implants Discharge Exam: Blood pressure 109/66, pulse 46, temperature 98.3 F (36.8 C), temperature source Oral, resp. rate 16, weight 82.101 kg (181 lb), SpO2 98.00%. Awake alert oriented strength out of 5  Disposition: home     Medication List         acetaminophen 325 MG tablet  Commonly known as:  TYLENOL  Take 650 mg by mouth every 6 (six) hours as needed.     amantadine 100 MG capsule  Commonly known as:  SYMMETREL  Take 1 capsule (100 mg total) by mouth 2 (two) times daily.     BLINK TEARS 0.25 % Gel  Generic drug:  Polyethylene Glycol 400  Apply to eye 2 (two) times a week. Both eyes     carbidopa-levodopa 25-100 MG per tablet  Commonly known as:  SINEMET IR  Take 2-3 tablets by mouth 4 (four) times daily. Take 3 at 9am, 2 at 1 pm, 3 at 5pm, 2 at 9pm     cholecalciferol 1000 UNITS tablet  Commonly known as:  VITAMIN D  Take 1,000 Units by mouth daily.     fluticasone 50 MCG/ACT nasal spray  Commonly known as:  FLONASE  Place 2 sprays into both nostrils daily as needed for allergies.     gabapentin 300 MG capsule  Commonly known as:  NEURONTIN  Take 1 capsule (300 mg total) by mouth 3 (three) times daily.     hydrochlorothiazide 25 MG tablet  Commonly known as:  HYDRODIURIL  Take 1 tablet (25 mg total)  by mouth daily.     multivitamin tablet  Take by mouth.     oxyCODONE-acetaminophen 5-325 MG per tablet  Commonly known as:  PERCOCET/ROXICET  Take 1-2 tablets by mouth every 4 (four) hours as needed for moderate pain.     oxyCODONE-acetaminophen 5-325 MG per tablet  Commonly known as:  PERCOCET/ROXICET  Take 1-2 tablets by mouth every 4 (four) hours as needed for severe pain.     pramipexole 0.5 MG tablet  Commonly known as:  MIRAPEX  Take 1 tablet (0.5 mg total) by mouth 3 (three) times daily.     pravastatin 20 MG tablet  Commonly known as:  PRAVACHOL  Take 1 tablet (20 mg total) by mouth every evening.     selegiline 5 MG capsule  Commonly known as:  ELDEPRYL  Take 1 capsule (5 mg total) by mouth daily.     STYE OP  Apply 1 application to eye 2 (two) times daily as needed (for styes).     tetrahydrozoline 0.05 % ophthalmic solution  Place 1 drop into both eyes 2 (two) times daily as needed (for itchy eyes).           Follow-up Information   Follow up with [name], MD.   Specialty:  Neurosurgery   Contact information:   [address] [place] [address] [phone]  Signed: Eudora Guevarra P 12/08/2013, 1:42 PM

## 2013-12-08 NOTE — Progress Notes (Signed)
Physical Therapy Treatment Patient Details Name: [name] MRN: [id number] DOB: May 07, 1950 Today's Date: 12/08/2013    History of Present Illness Pt is 63 y/o male with medical history of Parkinsons Disease and admitted for s/p SUBTHALAMIC STIMULATOR INSERTION (Bilateral) - Bilateral deep brain stimulator placement    PT Comments    Pt making steady progress. Balance a little improved with gait with walker. Acute PT to continue.  Follow Up Recommendations  No PT follow up;Supervision/Assistance - 24 hour (pt to have second surgery on friday, may need therapy follow up for balance after all surgeries completed.)     Equipment Recommendations  None recommended by PT       Precautions / Restrictions Precautions Precautions: Fall Restrictions Weight Bearing Restrictions: No    Mobility  Bed Mobility Overal bed mobility: Independent Bed Mobility: Supine to Sit;Sit to Supine     Supine to sit: Independent Sit to supine: Independent   General bed mobility comments: cues for safety with mobility.  Transfers Overall transfer level: Needs assistance Equipment used: Rolling walker (2 wheeled) Transfers: Sit to/from Stand Sit to Stand: Min assist         General transfer comment: cues on saftey and hand placement with transfers. Posterior lean with initial stand. uncontrolled descent with sitting to bed.  Ambulation/Gait Ambulation/Gait assistance: Min assist;Min guard Ambulation Distance (Feet): 200 Feet Assistive device: Rolling walker (2 wheeled) Gait Pattern/deviations: Step-through pattern Gait velocity: increased   General Gait Details: pt veering with walker toward right, needed min assist at times for balance due to veering and occasional crossing (scissoring) of feet with gait. cues to slow down and stay with walker. pt has Parkison's walker (rollator with line/beep for positioning with use) at home, however spouse reports house to too small to use it.     Stairs            Wheelchair Mobility    Modified Rankin (Stroke Patients Only)          Cognition Arousal/Alertness: Awake/alert Behavior During Therapy: Flat affect;Impulsive Overall Cognitive Status: Impaired/Different from baseline Area of Impairment: Problem solving;Safety/judgement   Current Attention Level: Focused         Problem Solving: Slow processing;Requires verbal cues General Comments: cues for safety through out session today.    Exercises      General Comments        Pertinent Vitals/Pain Pain Assessment: No/denies pain    Home Living Family/patient expects to be discharged to:: Private residence Living Arrangements: Spouse/significant other Available Help at Discharge: Family Type of Home: House Home Access: Ramped entrance   Home Layout: Two level;Able to live on main level with bedroom/bathroom Home Equipment: Gilford Rile - 2 wheels;Shower seat;Hand held shower head;Grab bars - tub/shower Additional Comments: Pt lives in one story home with basement.  However pt does go to basement which where shop is located.     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    PT Goals (current goals can now be found in the care plan section) Acute Rehab PT Goals Patient Stated Goal: go home PT Goal Formulation: With patient/family Time For Goal Achievement: 12/14/13 Potential to Achieve Goals: Good Progress towards PT goals: Progressing toward goals    Frequency  Min 4X/week    PT Plan Current plan remains appropriate       End of Session Equipment Utilized During Treatment:  Gait belt Activity Tolerance: Patient tolerated treatment well Patient left: in bed;with family/visitor present;with call bell/phone within reach;with bed alarm set     Time: 2244-9753 PT Time Calculation (min): 23 min  Charges:  $Gait Training: 8-22  mins $Therapeutic Activity: 8-22 mins                    G Codes:      Willow Ora 2013/12/23, 1:36 PM  Willow Ora, PTA Office- [phone]

## 2013-12-08 NOTE — Progress Notes (Signed)
Patient ID: [name], male   DOB: [birth year], 63 y.o.   MRN: [id number] Patient is doing well we'll discharge home

## 2013-12-08 NOTE — Discharge Instructions (Signed)
No lifting no bending no twisting no driving °

## 2013-12-08 NOTE — Evaluation (Signed)
Occupational Therapy Evaluation Patient Details Name: [name] MRN: [id number] DOB: Feb 05, 1951 Today's Date: 12/08/2013    History of Present Illness Pt is 63 y/o male with medical history of Parkinsons Disease and admitted for s/p SUBTHALAMIC STIMULATOR INSERTION (Bilateral) - Bilateral deep brain stimulator placement   Clinical Impression   Pt s/p above. Pt independent with ADLs, PTA. Feel pt will benefit from acute OT to increase independence prior to d/c.     Follow Up Recommendations  No OT follow up;Supervision/Assistance - 24 hour    Equipment Recommendations  None recommended by OT    Recommendations for Other Services       Precautions / Restrictions Precautions Precautions: Fall Restrictions Weight Bearing Restrictions: No      Mobility Bed Mobility Overal bed mobility: Modified Independent Bed Mobility: Supine to Sit;Sit to Supine     Supine to sit: Modified independent (Device/Increase time) Sit to supine: Modified independent (Device/Increase time)      Transfers Overall transfer level: Needs assistance   Transfers: Sit to/from Stand Sit to Stand: Mod assist;Min guard;Min assist         General transfer comment: varying levels of assist.    Balance                                            ADL Overall ADL's : Needs assistance/impaired     Grooming: Wash/dry face;Oral care;Standing;Min guard           Upper Body Dressing : Sitting;Minimal assistance   Lower Body Dressing: Sit to/from stand;Moderate assistance   Toilet Transfer: Min guard;Ambulation;Minimal assistance (bed)           Functional mobility during ADLs: Minimal assistance;Min guard General ADL Comments: Educated on safety tips for home (sitting for LB ADLs, rugs, safe shoewear pets, clutter). Recommended someone be with him for tub transfer and educated on safe technique. Pt with decreased balance when pulling up LB clothing. Mod A for balance  upon standing initially from bed.     Vision                     Perception     Praxis      Pertinent Vitals/Pain Pain Assessment: No/denies pain     Hand Dominance Right   Extremity/Trunk Assessment Upper Extremity Assessment Upper Extremity Assessment: Overall WFL for tasks assessed   Lower Extremity Assessment Lower Extremity Assessment: Defer to PT evaluation       Communication Communication Communication: No difficulties   Cognition Arousal/Alertness: Awake/alert Behavior During Therapy: Flat affect Overall Cognitive Status: Impaired/Different from baseline Area of Impairment: Problem solving             Problem Solving: Slow processing     General Comments       Exercises       Shoulder Instructions      Home Living Family/patient expects to be discharged to:: Private residence Living Arrangements: Spouse/significant other Available Help at Discharge: Family Type of Home: House Home Access: Ramped entrance     Home Layout: Two level;Able to live on main level with bedroom/bathroom Alternate Level Stairs-Number of Steps: 13 steps Alternate Level Stairs-Rails: Right Bathroom Shower/Tub: Tub/shower unit   Bathroom Toilet: Handicapped height (sink close)     Home Equipment: Environmental consultant - 2 wheels;Shower seat;Hand held shower head;Grab bars - tub/shower   Additional Comments: Pt  lives in one story home with basement.  However pt does go to basement which where shop is located.       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     OT Diagnosis: Acute pain   OT Problem List: Impaired balance (sitting and/or standing);Decreased knowledge of use of DME or AE;Decreased knowledge of precautions;Decreased cognition   OT Treatment/Interventions: Self-care/ADL training;DME and/or AE instruction;Therapeutic  activities;Patient/family education;Balance training    OT Goals(Current goals can be found in the care plan section) Acute Rehab OT Goals Patient Stated Goal: go home OT Goal Formulation: With patient Time For Goal Achievement: 12/15/13 Potential to Achieve Goals: Good ADL Goals Pt Will Perform Lower Body Bathing: with modified independence;sit to/from stand Pt Will Perform Lower Body Dressing: with modified independence;sit to/from stand Pt Will Transfer to Toilet: with modified independence;ambulating;grab bars (elevated toilet)  OT Frequency: Min 2X/week   Barriers to D/C:            Co-evaluation              End of Session Equipment Utilized During Treatment: Gait belt  Activity Tolerance: Patient tolerated treatment well Patient left: in bed;with call bell/phone within reach;with bed alarm set;with family/visitor present   Time: 7654-6503 OT Time Calculation (min): 29 min Charges:  OT General Charges $OT Visit: 1 Procedure OT Evaluation $Initial OT Evaluation Tier I: 1 Procedure OT Treatments $Self Care/Home Management : 8-22 mins G-CodesBenito Mccreedy OTR/L [phone] 12/08/2013, 10:50 AM

## 2013-12-09 NOTE — Anesthesia Postprocedure Evaluation (Signed)
Anesthesia Post Note  Patient: [name]  Procedure(s) Performed: Procedure(s) (LRB): SUBTHALAMIC STIMULATOR INSERTION (Bilateral)  Anesthesia type: MAC  Patient location: PACU  Post pain: Pain level controlled and Adequate analgesia  Post assessment: Post-op Vital signs reviewed, Patient's Cardiovascular Status Stable and Respiratory Function Stable  Last Vitals:  Filed Vitals:   12/08/13 1434  BP: 110/62  Pulse: 54  Temp: 36.9 C  Resp: 16    Post vital signs: Reviewed and stable  Level of consciousness: awake, alert  and oriented  Complications: No apparent anesthesia complications

## 2013-12-10 ENCOUNTER — Telehealth: Payer: Self-pay | Admitting: Neurology

## 2013-12-10 ENCOUNTER — Encounter (HOSPITAL_COMMUNITY): Payer: Self-pay | Admitting: Neurosurgery

## 2013-12-10 NOTE — Telephone Encounter (Signed)
Pt called requesting to speak to a nurse regarding a f/u after his stim. C/B [phone]

## 2013-12-10 NOTE — Telephone Encounter (Signed)
4-5 weeks for initial programming at end of day and then another programming session 1 week after that.

## 2013-12-10 NOTE — Telephone Encounter (Signed)
Pt called wanting to speak to a nurse regarding making a f/u after his stim. C/B [phone]

## 2013-12-10 NOTE — Telephone Encounter (Signed)
Appt 's made with patient

## 2013-12-10 NOTE — Telephone Encounter (Signed)
When do you want to see him back?

## 2013-12-12 NOTE — Anesthesia Preprocedure Evaluation (Addendum)
Anesthesia Evaluation  Patient identified by MRN, date of birth, ID band Patient awake    Reviewed: Allergy & Precautions, H&P , NPO status , Patient's Chart, lab work & pertinent test results  Airway Mallampati: II TM Distance: >3 FB Neck ROM: Full    Dental  (+) Dental Advisory Given, Teeth Intact   Pulmonary sleep apnea ,  breath sounds clear to auscultation        Cardiovascular hypertension, Pt. on medications Rhythm:Regular Rate:Normal     Neuro/Psych  Headaches, PSYCHIATRIC DISORDERS Depression Parkinsons, Had stimulator electrodes placed last week    GI/Hepatic GERD-  ,  Endo/Other    Renal/GU      Musculoskeletal  (+) Arthritis -,   Abdominal (+)  Abdomen: soft.    Peds  Hematology   Anesthesia Other Findings   Reproductive/Obstetrics                        Anesthesia Physical Anesthesia Plan  ASA: III  Anesthesia Plan: General   Post-op Pain Management:    Induction: Intravenous  Airway Management Planned: Oral ETT  Additional Equipment:   Intra-op Plan:   Post-operative Plan: Extubation in OR  Informed Consent: I have reviewed the patients History and Physical, chart, labs and discussed the procedure including the risks, benefits and alternatives for the proposed anesthesia with the patient or authorized representative who has indicated his/her understanding and acceptance.   Dental advisory given  Plan Discussed with: CRNA, Anesthesiologist and Surgeon  Anesthesia Plan Comments:        Anesthesia Quick Evaluation

## 2013-12-13 ENCOUNTER — Encounter (HOSPITAL_COMMUNITY)
Admission: RE | Disposition: A | Discharge status: Home / Self Care | Payer: Self-pay | Source: Ambulatory Visit | Attending: Neurosurgery

## 2013-12-13 ENCOUNTER — Inpatient Hospital Stay (HOSPITAL_COMMUNITY): Payer: Medicare PPO | Admitting: Anesthesiology

## 2013-12-13 ENCOUNTER — Encounter (HOSPITAL_COMMUNITY): Payer: Medicare PPO | Admitting: Anesthesiology

## 2013-12-13 ENCOUNTER — Encounter (HOSPITAL_COMMUNITY): Payer: Self-pay | Admitting: *Deleted

## 2013-12-13 ENCOUNTER — Observation Stay (HOSPITAL_COMMUNITY)
Admission: RE | Admit: 2013-12-13 | Discharge: 2013-12-14 | Disposition: A | Discharge status: Home / Self Care | Payer: Medicare PPO | Source: Ambulatory Visit | Attending: Neurosurgery | Admitting: Neurosurgery

## 2013-12-13 DIAGNOSIS — Z85828 Personal history of other malignant neoplasm of skin: Secondary | ICD-10-CM | POA: Diagnosis not present

## 2013-12-13 DIAGNOSIS — G20A1 Parkinson's disease without dyskinesia, without mention of fluctuations: Principal | ICD-10-CM | POA: Diagnosis present

## 2013-12-13 DIAGNOSIS — G20 Parkinson's disease: Secondary | ICD-10-CM | POA: Diagnosis not present

## 2013-12-13 DIAGNOSIS — I10 Essential (primary) hypertension: Secondary | ICD-10-CM | POA: Diagnosis not present

## 2013-12-13 HISTORY — PX: PULSE GENERATOR IMPLANT: SHX5370

## 2013-12-13 SURGERY — BILATERAL PULSE GENERATOR IMPLANT
Anesthesia: General | Laterality: Bilateral

## 2013-12-13 MED ORDER — PRAMIPEXOLE DIHYDROCHLORIDE 0.25 MG PO TABS
0.5000 mg | ORAL_TABLET | Freq: Three times a day (TID) | ORAL | Status: DC
  Administered 2013-12-13 – 2013-12-14 (×4): 0.5 mg via ORAL
  Filled 2013-12-13 (×6): qty 2

## 2013-12-13 MED ORDER — STERILE WATER FOR INJECTION IJ SOLN
INTRAMUSCULAR | Status: AC
  Filled 2013-12-13: qty 10

## 2013-12-13 MED ORDER — ONDANSETRON HCL 4 MG/2ML IJ SOLN
INTRAMUSCULAR | Status: DC | PRN
  Administered 2013-12-13: 4 mg via INTRAVENOUS

## 2013-12-13 MED ORDER — CEFAZOLIN SODIUM-DEXTROSE 2-3 GM-% IV SOLR
INTRAVENOUS | Status: AC
  Filled 2013-12-13: qty 50

## 2013-12-13 MED ORDER — ONDANSETRON HCL 4 MG/2ML IJ SOLN
INTRAMUSCULAR | Status: AC
  Filled 2013-12-13: qty 2

## 2013-12-13 MED ORDER — LACTATED RINGERS IV SOLN
INTRAVENOUS | Status: DC | PRN
  Administered 2013-12-13 (×2): via INTRAVENOUS

## 2013-12-13 MED ORDER — PROPOFOL 10 MG/ML IV BOLUS
INTRAVENOUS | Status: DC | PRN
  Administered 2013-12-13: 150 mg via INTRAVENOUS

## 2013-12-13 MED ORDER — FLUTICASONE PROPIONATE 50 MCG/ACT NA SUSP
2.0000 | Freq: Every day | NASAL | Status: DC | PRN
Start: 2013-12-13 — End: 2013-12-14
  Administered 2013-12-13: 2 via NASAL
  Filled 2013-12-13 (×2): qty 16

## 2013-12-13 MED ORDER — ADULT MULTIVITAMIN W/MINERALS CH
1.0000 | ORAL_TABLET | Freq: Every day | ORAL | Status: DC
  Administered 2013-12-13 – 2013-12-14 (×2): 1 via ORAL
  Filled 2013-12-13 (×2): qty 1

## 2013-12-13 MED ORDER — PANTOPRAZOLE SODIUM 40 MG PO TBEC
40.0000 mg | DELAYED_RELEASE_TABLET | Freq: Every day | ORAL | Status: DC
  Administered 2013-12-13: 40 mg via ORAL
  Filled 2013-12-13: qty 1

## 2013-12-13 MED ORDER — GLYCOPYRROLATE 0.2 MG/ML IJ SOLN
INTRAMUSCULAR | Status: DC | PRN
  Administered 2013-12-13: 0.2 mg via INTRAVENOUS
  Administered 2013-12-13: .6 mg via INTRAVENOUS

## 2013-12-13 MED ORDER — NEOSTIGMINE METHYLSULFATE 10 MG/10ML IV SOLN
INTRAVENOUS | Status: AC
  Filled 2013-12-13: qty 3

## 2013-12-13 MED ORDER — NEOSTIGMINE METHYLSULFATE 10 MG/10ML IV SOLN
INTRAVENOUS | Status: DC | PRN
  Administered 2013-12-13: 4 mg via INTRAVENOUS

## 2013-12-13 MED ORDER — SUCCINYLCHOLINE CHLORIDE 20 MG/ML IJ SOLN
INTRAMUSCULAR | Status: AC
  Filled 2013-12-13: qty 1

## 2013-12-13 MED ORDER — CARBIDOPA-LEVODOPA 25-100 MG PO TABS
3.0000 | ORAL_TABLET | Freq: Two times a day (BID) | ORAL | Status: DC
  Administered 2013-12-13 – 2013-12-14 (×2): 3 via ORAL
  Filled 2013-12-13 (×4): qty 3

## 2013-12-13 MED ORDER — OXYCODONE-ACETAMINOPHEN 5-325 MG PO TABS
1.0000 | ORAL_TABLET | ORAL | Status: DC | PRN
  Administered 2013-12-13 – 2013-12-14 (×3): 2 via ORAL
  Filled 2013-12-13: qty 2

## 2013-12-13 MED ORDER — ATROPINE SULFATE 0.1 MG/ML IJ SOLN
INTRAMUSCULAR | Status: AC
  Filled 2013-12-13: qty 10

## 2013-12-13 MED ORDER — PANTOPRAZOLE SODIUM 40 MG IV SOLR
40.0000 mg | Freq: Every day | INTRAVENOUS | Status: DC
  Filled 2013-12-13: qty 40

## 2013-12-13 MED ORDER — GLYCOPYRROLATE 0.2 MG/ML IJ SOLN
INTRAMUSCULAR | Status: AC
  Filled 2013-12-13: qty 3

## 2013-12-13 MED ORDER — HYDROCODONE-ACETAMINOPHEN 5-325 MG PO TABS: 1.0000 | ORAL_TABLET | ORAL | Status: DC | PRN

## 2013-12-13 MED ORDER — ROCURONIUM BROMIDE 50 MG/5ML IV SOLN
INTRAVENOUS | Status: AC
  Filled 2013-12-13: qty 1

## 2013-12-13 MED ORDER — CEFAZOLIN SODIUM-DEXTROSE 2-3 GM-% IV SOLR
INTRAVENOUS | Status: DC | PRN
  Administered 2013-12-13: 2 g via INTRAVENOUS

## 2013-12-13 MED ORDER — FENTANYL CITRATE 0.05 MG/ML IJ SOLN
INTRAMUSCULAR | Status: AC
  Filled 2013-12-13: qty 5

## 2013-12-13 MED ORDER — BISACODYL 10 MG RE SUPP: 10.0000 mg | Freq: Every day | RECTAL | Status: DC | PRN

## 2013-12-13 MED ORDER — HYDROCHLOROTHIAZIDE 25 MG PO TABS
25.0000 mg | ORAL_TABLET | Freq: Every day | ORAL | Status: DC
  Administered 2013-12-13 – 2013-12-14 (×2): 25 mg via ORAL
  Filled 2013-12-13 (×2): qty 1

## 2013-12-13 MED ORDER — CEFAZOLIN SODIUM 1-5 GM-% IV SOLN
1.0000 g | Freq: Three times a day (TID) | INTRAVENOUS | Status: AC
  Administered 2013-12-13 (×2): 1 g via INTRAVENOUS
  Filled 2013-12-13 (×2): qty 50

## 2013-12-13 MED ORDER — PROPOFOL 10 MG/ML IV BOLUS
INTRAVENOUS | Status: AC
  Filled 2013-12-13: qty 20

## 2013-12-13 MED ORDER — KCL IN DEXTROSE-NACL 20-5-0.45 MEQ/L-%-% IV SOLN
INTRAVENOUS | Status: DC
  Filled 2013-12-13 (×3): qty 1000

## 2013-12-13 MED ORDER — FENTANYL CITRATE 0.05 MG/ML IJ SOLN
INTRAMUSCULAR | Status: AC
Start: 2013-12-13 — End: 2013-12-13
  Filled 2013-12-13: qty 5

## 2013-12-13 MED ORDER — FLEET ENEMA 7-19 GM/118ML RE ENEM
1.0000 | ENEMA | Freq: Once | RECTAL | Status: AC | PRN
  Filled 2013-12-13: qty 1

## 2013-12-13 MED ORDER — SENNOSIDES-DOCUSATE SODIUM 8.6-50 MG PO TABS
1.0000 | ORAL_TABLET | Freq: Every evening | ORAL | Status: DC | PRN
  Administered 2013-12-13: 1 via ORAL
  Filled 2013-12-13: qty 1

## 2013-12-13 MED ORDER — ACETAMINOPHEN 325 MG PO TABS: 650.0000 mg | ORAL_TABLET | Freq: Four times a day (QID) | ORAL | Status: DC | PRN

## 2013-12-13 MED ORDER — GABAPENTIN 300 MG PO CAPS
300.0000 mg | ORAL_CAPSULE | Freq: Three times a day (TID) | ORAL | Status: DC
  Administered 2013-12-13 – 2013-12-14 (×4): 300 mg via ORAL
  Filled 2013-12-13 (×6): qty 1

## 2013-12-13 MED ORDER — BUPIVACAINE HCL (PF) 0.5 % IJ SOLN
INTRAMUSCULAR | Status: DC | PRN
  Administered 2013-12-13: 12 mL

## 2013-12-13 MED ORDER — CARBIDOPA-LEVODOPA 25-100 MG PO TABS
2.0000 | ORAL_TABLET | Freq: Two times a day (BID) | ORAL | Status: DC
  Administered 2013-12-13 (×2): 2 via ORAL
  Filled 2013-12-13 (×4): qty 2

## 2013-12-13 MED ORDER — ATROPINE SULFATE 1 MG/ML IJ SOLN
INTRAMUSCULAR | Status: DC | PRN
  Administered 2013-12-13 (×2): 0.2 mg via INTRAVENOUS

## 2013-12-13 MED ORDER — DIAZEPAM 5 MG PO TABS: 5.0000 mg | ORAL_TABLET | Freq: Four times a day (QID) | ORAL | Status: DC | PRN

## 2013-12-13 MED ORDER — ROCURONIUM BROMIDE 100 MG/10ML IV SOLN
INTRAVENOUS | Status: DC | PRN
  Administered 2013-12-13: 30 mg via INTRAVENOUS

## 2013-12-13 MED ORDER — ACETAMINOPHEN 325 MG PO TABS: 650.0000 mg | ORAL_TABLET | ORAL | Status: DC | PRN

## 2013-12-13 MED ORDER — EPHEDRINE SULFATE 50 MG/ML IJ SOLN
INTRAMUSCULAR | Status: AC
  Filled 2013-12-13: qty 1

## 2013-12-13 MED ORDER — OXYCODONE-ACETAMINOPHEN 5-325 MG PO TABS
1.0000 | ORAL_TABLET | ORAL | Status: DC | PRN
  Filled 2013-12-13: qty 2

## 2013-12-13 MED ORDER — VITAMIN D3 25 MCG (1000 UNIT) PO TABS
1000.0000 [IU] | ORAL_TABLET | Freq: Every day | ORAL | Status: DC
  Administered 2013-12-13 – 2013-12-14 (×2): 1000 [IU] via ORAL
  Filled 2013-12-13 (×2): qty 1

## 2013-12-13 MED ORDER — 0.9 % SODIUM CHLORIDE (POUR BTL) OPTIME
TOPICAL | Status: DC | PRN
  Administered 2013-12-13: 1000 mL

## 2013-12-13 MED ORDER — MIDAZOLAM HCL 2 MG/2ML IJ SOLN
INTRAMUSCULAR | Status: AC
  Filled 2013-12-13: qty 2

## 2013-12-13 MED ORDER — NALOXONE HCL 0.4 MG/ML IJ SOLN
INTRAMUSCULAR | Status: DC | PRN
  Administered 2013-12-13: .04 mg via INTRAVENOUS

## 2013-12-13 MED ORDER — BACITRACIN ZINC 500 UNIT/GM EX OINT
TOPICAL_OINTMENT | CUTANEOUS | Status: DC | PRN
  Administered 2013-12-13: 1 via TOPICAL

## 2013-12-13 MED ORDER — SIMVASTATIN 10 MG PO TABS
10.0000 mg | ORAL_TABLET | Freq: Every day | ORAL | Status: DC
  Administered 2013-12-13: 10 mg via ORAL
  Filled 2013-12-13 (×2): qty 1

## 2013-12-13 MED ORDER — NALOXONE HCL 0.4 MG/ML IJ SOLN
INTRAMUSCULAR | Status: AC
  Filled 2013-12-13: qty 1

## 2013-12-13 MED ORDER — SODIUM CHLORIDE 0.9 % IJ SOLN: 3.0000 mL | INTRAMUSCULAR | Status: DC | PRN

## 2013-12-13 MED ORDER — NAPHAZOLINE HCL 0.1 % OP SOLN
1.0000 [drp] | Freq: Four times a day (QID) | OPHTHALMIC | Status: DC | PRN
  Filled 2013-12-13: qty 15

## 2013-12-13 MED ORDER — ONDANSETRON HCL 4 MG/2ML IJ SOLN: 4.0000 mg | INTRAMUSCULAR | Status: DC | PRN

## 2013-12-13 MED ORDER — ACETAMINOPHEN 650 MG RE SUPP: 650.0000 mg | RECTAL | Status: DC | PRN

## 2013-12-13 MED ORDER — LIDOCAINE-EPINEPHRINE 1 %-1:100000 IJ SOLN
INTRAMUSCULAR | Status: DC | PRN
  Administered 2013-12-13: 12 mL via INTRADERMAL

## 2013-12-13 MED ORDER — SELEGILINE HCL 5 MG PO CAPS
5.0000 mg | ORAL_CAPSULE | Freq: Every day | ORAL | Status: DC
  Administered 2013-12-13 – 2013-12-14 (×2): 5 mg via ORAL
  Filled 2013-12-13 (×2): qty 1

## 2013-12-13 MED ORDER — PHENOL 1.4 % MT LIQD: 1.0000 | OROMUCOSAL | Status: DC | PRN

## 2013-12-13 MED ORDER — AMANTADINE HCL 100 MG PO CAPS
100.0000 mg | ORAL_CAPSULE | Freq: Two times a day (BID) | ORAL | Status: DC
  Administered 2013-12-13 – 2013-12-14 (×3): 100 mg via ORAL
  Filled 2013-12-13 (×4): qty 1

## 2013-12-13 MED ORDER — LIDOCAINE HCL (CARDIAC) 20 MG/ML IV SOLN
INTRAVENOUS | Status: DC | PRN
  Administered 2013-12-13: 100 mg via INTRAVENOUS

## 2013-12-13 MED ORDER — FENTANYL CITRATE 0.05 MG/ML IJ SOLN
INTRAMUSCULAR | Status: DC | PRN
  Administered 2013-12-13: 75 ug via INTRAVENOUS
  Administered 2013-12-13 (×2): 50 ug via INTRAVENOUS
  Administered 2013-12-13: 25 ug via INTRAVENOUS
  Administered 2013-12-13 (×2): 50 ug via INTRAVENOUS

## 2013-12-13 MED ORDER — DOCUSATE SODIUM 100 MG PO CAPS
100.0000 mg | ORAL_CAPSULE | Freq: Two times a day (BID) | ORAL | Status: DC
  Administered 2013-12-13 – 2013-12-14 (×3): 100 mg via ORAL
  Filled 2013-12-13 (×4): qty 1

## 2013-12-13 MED ORDER — SODIUM CHLORIDE 0.9 % IJ SOLN
3.0000 mL | Freq: Two times a day (BID) | INTRAMUSCULAR | Status: DC
  Administered 2013-12-13 (×2): 3 mL via INTRAVENOUS

## 2013-12-13 MED ORDER — ALUM & MAG HYDROXIDE-SIMETH 200-200-20 MG/5ML PO SUSP: 30.0000 mL | Freq: Four times a day (QID) | ORAL | Status: DC | PRN

## 2013-12-13 MED ORDER — MORPHINE SULFATE 2 MG/ML IJ SOLN: 1.0000 mg | INTRAMUSCULAR | Status: DC | PRN

## 2013-12-13 MED ORDER — LIDOCAINE HCL (CARDIAC) 20 MG/ML IV SOLN
INTRAVENOUS | Status: AC
  Filled 2013-12-13: qty 5

## 2013-12-13 MED ORDER — VANCOMYCIN HCL 1000 MG IV SOLR
INTRAVENOUS | Status: AC
  Filled 2013-12-13: qty 1000

## 2013-12-13 MED ORDER — SENNA 8.6 MG PO TABS
1.0000 | ORAL_TABLET | Freq: Two times a day (BID) | ORAL | Status: DC
  Administered 2013-12-14: 8.6 mg via ORAL
  Filled 2013-12-13 (×2): qty 1

## 2013-12-13 MED ORDER — MENTHOL 3 MG MT LOZG: 1.0000 | LOZENGE | OROMUCOSAL | Status: DC | PRN

## 2013-12-13 MED ORDER — ZOLPIDEM TARTRATE 5 MG PO TABS: 5.0000 mg | ORAL_TABLET | Freq: Every evening | ORAL | Status: DC | PRN

## 2013-12-13 SURGICAL SUPPLY — 67 items
BAG DECANTER FOR FLEXI CONT (MISCELLANEOUS) IMPLANT
BOOT SUTURE AID YELLOW STND (SUTURE) IMPLANT
CANISTER SUCT 3000ML (MISCELLANEOUS) ×2 IMPLANT
COIL EXT DBS STRETCH 60 (MISCELLANEOUS) ×4 IMPLANT
CORDS BIPOLAR (ELECTRODE) ×2 IMPLANT
DERMABOND ADVANCED (GAUZE/BANDAGES/DRESSINGS) ×2
DERMABOND ADVANCED .7 DNX12 (GAUZE/BANDAGES/DRESSINGS) ×2 IMPLANT
DRAPE CAMERA VIDEO/LASER (DRAPES) ×4 IMPLANT
DRAPE LAPAROTOMY T 102X78X121 (DRAPES) IMPLANT
DRAPE NEUROLOGICAL W/INCISE (DRAPES) IMPLANT
DRAPE ORTHO SPLIT 77X108 STRL (DRAPES) ×4
DRAPE POUCH INSTRU U-SHP 10X18 (DRAPES) ×4 IMPLANT
DRAPE SURG ORHT 6 SPLT 77X108 (DRAPES) ×4 IMPLANT
DRSG OPSITE 4X5.5 SM (GAUZE/BANDAGES/DRESSINGS) ×4 IMPLANT
DRSG OPSITE POSTOP 4X6 (GAUZE/BANDAGES/DRESSINGS) ×4 IMPLANT
DRSG TELFA 3X8 NADH (GAUZE/BANDAGES/DRESSINGS) ×4 IMPLANT
ELECT CAUTERY BLADE 6.4 (BLADE) ×4 IMPLANT
Extension Kit (Neuro Prosthesis/Implant) ×4 IMPLANT
GAUZE SPONGE 4X4 12PLY STRL (GAUZE/BANDAGES/DRESSINGS) IMPLANT
GAUZE SPONGE 4X4 16PLY XRAY LF (GAUZE/BANDAGES/DRESSINGS) ×4 IMPLANT
GLOVE BIO SURGEON STRL SZ8 (GLOVE) ×6 IMPLANT
GLOVE BIOGEL PI IND STRL 7.5 (GLOVE) ×1 IMPLANT
GLOVE BIOGEL PI IND STRL 8 (GLOVE) ×2 IMPLANT
GLOVE BIOGEL PI IND STRL 8.5 (GLOVE) ×2 IMPLANT
GLOVE BIOGEL PI INDICATOR 7.5 (GLOVE) ×1
GLOVE BIOGEL PI INDICATOR 8 (GLOVE) ×2
GLOVE BIOGEL PI INDICATOR 8.5 (GLOVE) ×2
GLOVE ECLIPSE 7.5 STRL STRAW (GLOVE) ×4 IMPLANT
GLOVE ECLIPSE 8.0 STRL XLNG CF (GLOVE) ×4 IMPLANT
GLOVE EXAM NITRILE LRG STRL (GLOVE) IMPLANT
GLOVE EXAM NITRILE MD LF STRL (GLOVE) IMPLANT
GLOVE EXAM NITRILE XL STR (GLOVE) IMPLANT
GLOVE EXAM NITRILE XS STR PU (GLOVE) IMPLANT
GLOVE SURG SS PI 7.0 STRL IVOR (GLOVE) ×2 IMPLANT
GLOVE SURG SS PI 7.5 STRL IVOR (GLOVE) ×2 IMPLANT
GOWN STRL REUS W/ TWL LRG LVL3 (GOWN DISPOSABLE) IMPLANT
GOWN STRL REUS W/ TWL XL LVL3 (GOWN DISPOSABLE) ×1 IMPLANT
GOWN STRL REUS W/TWL 2XL LVL3 (GOWN DISPOSABLE) ×2 IMPLANT
GOWN STRL REUS W/TWL LRG LVL3 (GOWN DISPOSABLE)
GOWN STRL REUS W/TWL XL LVL3 (GOWN DISPOSABLE) ×1
KIT BASIN OR (CUSTOM PROCEDURE TRAY) ×2 IMPLANT
KIT HEADREST PASSIVE (Neuro Prosthesis/Implant) ×2 IMPLANT
KIT REMOVER STAPLE SKIN (MISCELLANEOUS) ×2 IMPLANT
KIT ROOM TURNOVER OR (KITS) ×2 IMPLANT
NEEDLE HYPO 25X1 1.5 SAFETY (NEEDLE) ×2 IMPLANT
NEUROSTIM OCTOPOLAR ~~LOC~~ 60X55 (Neuro Prosthesis/Implant) ×4 IMPLANT
NEUROSTIM PROGRAMMER 2.2X3.7 (Neuro Prosthesis/Implant) ×2 IMPLANT
NS IRRIG 1000ML POUR BTL (IV SOLUTION) ×2 IMPLANT
PACK LAMINECTOMY NEURO (CUSTOM PROCEDURE TRAY) ×2 IMPLANT
PAD ARMBOARD 7.5X6 YLW CONV (MISCELLANEOUS) ×6 IMPLANT
PAD SHARPS MAGNETIC DISPOSAL (MISCELLANEOUS) ×2 IMPLANT
PENCIL BUTTON HOLSTER BLD 10FT (ELECTRODE) ×2 IMPLANT
SPONGE LAP 4X18 X RAY DECT (DISPOSABLE) ×2 IMPLANT
STAPLER SKIN PROX WIDE 3.9 (STAPLE) ×4 IMPLANT
STOCKINETTE 4X48 STRL (DRAPES) IMPLANT
STRIP CLOSURE SKIN 1/2X4 (GAUZE/BANDAGES/DRESSINGS) ×2 IMPLANT
SUT ETHILON 3 0 PS 1 (SUTURE) ×4 IMPLANT
SUT SILK 2 0 TIES 10X30 (SUTURE) ×2 IMPLANT
SUT VIC AB 2-0 CP2 18 (SUTURE) ×4 IMPLANT
SUT VIC AB 3-0 SH 8-18 (SUTURE) ×8 IMPLANT
SYR BULB 3OZ (MISCELLANEOUS) IMPLANT
TOOL TUNNELING (INSTRUMENTS) ×4 IMPLANT
TOWEL OR 17X24 6PK STRL BLUE (TOWEL DISPOSABLE) ×4 IMPLANT
TOWEL OR 17X26 10 PK STRL BLUE (TOWEL DISPOSABLE) ×2 IMPLANT
TUBING BULK SUCTION (MISCELLANEOUS) ×2 IMPLANT
UNDERPAD 30X30 INCONTINENT (UNDERPADS AND DIAPERS) ×2 IMPLANT
WATER STERILE IRR 1000ML POUR (IV SOLUTION) ×2 IMPLANT

## 2013-12-13 NOTE — Evaluation (Addendum)
Physical Therapy Evaluation Patient Details Name: [name] MRN: [id number] DOB: 1950/10/17 Today's Date: 12/13/2013   History of Present Illness  Patient is a 63 y/o male admitted with history of Parkinsons Disease, s/p Stage I bilateral STN DBS electrode placement.  Clinical Impression  Patient presents with mild unsteadiness and dyskinesia.  Patient and wife report hope the stimulator improves mobility and stops falls.  They would like to wait and see prior to setting up any therapy post hospitalization.  They do state speech therapy would help.  Spoke with RN regarding getting speech order.  No further skilled PT needs at this time.  RN informed will need assist for safety for ambulation in hospital.     Follow Up Recommendations No PT follow up;Supervision/Assistance - 24 hour    Equipment Recommendations  None recommended by PT    Recommendations for Other Services Speech consult     Precautions / Restrictions Precautions Precautions: Fall      Mobility  Bed Mobility Overal bed mobility: Independent       Supine to sit: Independent Sit to supine: Independent      Transfers Overall transfer level: Needs assistance     Sit to Stand: Min guard         General transfer comment: assist for safety, no posterior lean noted after stepping away from bedside  Ambulation/Gait Ambulation/Gait assistance: Min guard Ambulation Distance (Feet): 200 Feet Assistive device: None Gait Pattern/deviations: Step-through pattern;Decreased dorsiflexion - right;Decreased dorsiflexion - left     General Gait Details: mildly unsteady with ambulation and some dyskinesia noted, but no loss of balance  Stairs            Wheelchair Mobility    Modified Rankin (Stroke Patients Only)       Balance Overall balance assessment: Needs assistance   Sitting balance-Leahy Scale: Good       Standing balance-Leahy Scale: Fair Standing balance comment: static balance with  supervision only, dynamic (head turn), minguard for safety, notes has fallen 6x in 6 months                             Pertinent Vitals/Pain Pain Assessment: Faces Faces Pain Scale: Hurts little more Pain Location: neck pain with movement Pain Intervention(s): Monitored during session    [place] expects to be discharged to:: Private residence Living Arrangements: Spouse/significant other Available Help at Discharge: Family Type of Home: House Home Access: Ramped entrance     Home Layout: Two level;Able to live on main level with bedroom/bathroom Home Equipment: Gilford Rile - 2 wheels;Shower seat;Hand held shower head;Grab bars - tub/shower Additional Comments: Pt lives in one story home with basement.  However pt does go to basement which where shop is located.     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      Hand Dominance   Dominant Hand: Right    Extremity/Trunk Assessment               Lower Extremity Assessment: Overall WFL for tasks assessed      Cervical / Trunk Assessment: Other exceptions  Communication   Communication: No difficulties  Cognition Arousal/Alertness: Awake/alert Behavior During Therapy: WFL for tasks assessed/performed Overall Cognitive Status: Impaired/Different from baseline Area of Impairment: Problem solving;Safety/judgement  Problem Solving: Slow processing;Requires verbal cues      General Comments      Exercises        Assessment/Plan    PT Assessment Patent does not need any further PT services  PT Diagnosis     PT Problem List    PT Treatment Interventions     PT Goals (Current goals can be found in the Care Plan section) Acute Rehab PT Goals PT Goal Formulation: No goals set, d/c therapy    Frequency     Barriers to discharge        Co-evaluation                End of Session Equipment Utilized During Treatment: Gait belt Activity Tolerance: Patient tolerated treatment well Patient left: in bed;with call bell/phone within reach;with family/visitor present Nurse Communication: Mobility status    Functional Assessment Tool Used: Cinical Judgement Functional Limitation: Mobility: Walking and moving around Mobility: Walking and Moving Around Current Status (A6301): At least 20 percent but less than 40 percent impaired, limited or restricted Mobility: Walking and Moving Around Goal Status [id number]): At least 20 percent but less than 40 percent impaired, limited or restricted Mobility: Walking and Moving Around Discharge Status [phone]): At least 20 percent but less than 40 percent impaired, limited or restricted    Time: 7322-0254 PT Time Calculation (min): 29 min   Charges:   PT Evaluation $Initial PT Evaluation Tier I: 1 Procedure PT Treatments $Gait Training: 8-22 mins   PT G Codes:   Functional Assessment Tool Used: Cinical Judgement Functional Limitation: Mobility: Walking and moving around    [facility] 12/13/2013, 4:37 PM  [place], [place] 12/13/2013

## 2013-12-13 NOTE — Op Note (Signed)
12/13/2013  10:03 AM  PATIENT:  [name]  63 y.o. male  PRE-OPERATIVE DIAGNOSIS:  Parkinsons Disease, s/p Stage I bilateral STN DBS electrode placement  POST-OPERATIVE DIAGNOSIS:  Parkinsons Disease, s/p Stage I bilateral STN DBS electrode placement  PROCEDURE:  Procedure(s) with comments: Bilateral implantable pulse generator placement (Bilateral) - Bilateral implantable pulse generator placement  SURGEON:  Surgeon(s) and Role:    * Erline Levine, MD - Primary  PHYSICIAN ASSISTANT:   ASSISTANTS: [place], RN   ANESTHESIA:   general  EBL:  Total I/O In: 1200 [I.V.:1200] Out: 50 [Blood:50]  BLOOD ADMINISTERED:none  DRAINS: none   LOCAL MEDICATIONS USED:  LIDOCAINE   SPECIMEN:  No Specimen  DISPOSITION OF SPECIMEN:  N/A  COUNTS:  YES  TOURNIQUET:  * No tourniquets in log *  DICTATION: Patient has implanted bilateral subthalamic stimulator electrodes having recently completed DBS Stage I and now presents for placement of lead extensions and bilateral IPG implantation.  PROCEDURE: Patient was brought to the operating room and GETA anesthesia was induced.  Left upper chest, scalp, neck were prepped with betadine scrub and Duraprep.  Area of planned incision was infiltrated with lidocaine.  Scalp incision was made and the lead extensions were exposed. An incision was made in the left upper chest and a pocket was created.  Extension tunnel was made from scalp to pocket.  PC IPG was placed and attached to lead extensions, which in turn were connected to cranial leads and torqued appropriately.  The IPG  was placed in the pocket.  Wounds were irrigated and closed with 2-0 Vicryl and 3-0 vicryl sutures at the pocket and 2-0 vicryl at the scalp with staples. and dressed with a sterile occlusive dressing.  Counts were correct at the end of the case.  The patient was then repositioned to expose the right side and right upper chest, scalp, neck were prepped with betadine scrub and  Duraprep.  Area of planned incision was infiltrated with lidocaine.  Scalp incision was made and the lead extensions were exposed. An incision was made in the right upper chest and a pocket was created.  Extension tunnel was made from scalp to pocket.  PC IPG was placed and attached to lead extensions, which in turn were connected to cranial leads and torqued appropriately.  The IPG  was placed in the pocket.  Wounds were irrigated and closed with 2-0 Vicryl and 3-0 vicryl sutures at the pocket and 2-0 vicryl at the scalp with staples. and dressed with a sterile occlusive dressing.  Counts were correct at the end of the case.   PLAN OF CARE: Admit for overnight observation  PATIENT DISPOSITION:  PACU - hemodynamically stable.   Delay start of Pharmacological VTE agent (>24hrs) due to surgical blood loss or risk of bleeding: yes

## 2013-12-13 NOTE — Interval H&P Note (Signed)
History and Physical Interval Note:  12/13/2013 3:03 AM  [name]  has presented today for surgery, with the diagnosis of Parkinsons  The various methods of treatment have been discussed with the patient and family. After consideration of risks, benefits and other options for treatment, the patient has consented to  Procedure(s) with comments: Bilateral implantable pulse generator placement (Bilateral) - Bilateral implantable pulse generator placement as a surgical intervention .  The patient's history has been reviewed, patient examined, no change in status, stable for surgery.  I have reviewed the patient's chart and labs.  Questions were answered to the patient's satisfaction.     [name]

## 2013-12-13 NOTE — H&P (View-Only) (Signed)
[place], [place] [address] Phone: [phone]   Patient ID:   [id number] Patient: [name]  Date of Birth: 1951-04-12 Visit Type: Office Visit   Date: 09/25/2013 09:15 AM Provider: Marchia Meiers. Vertell Limber MD   This 63 year old male presents for Follow Up of Parkinson's Disease.  History of Present Illness: 1.  Follow Up of Parkinson's Disease  [name], a patient of Dr. Marchelle Folks having had lumbar laminectomy in August of 2014, visits to see Dr. Vertell Limber on Dr. Doristine Devoid referral.  He reports that he was dx'ed with Parkinsons disease 32yrs ago & his speech & swallowing have become very problematic. He is interested in Deep Brain Stimulation.   Recently - skin cancer removed left upper arm & right lateral calf - healing well.  Mild Htn managed with HTCZ.  MRI not yet scheduled.  Patient has right greater than left-sided rigidity.  He has reviewed the record for and DVTs regarding deep brain stimulation for Parkinson's disease.  He has had a psychological evaluation.  He has had 15 years of Parkinson's.  He notes problems with speech and swallowing as well as rigidity.  He also notes problems with on and off.  He is interested in pursuing deep brain stimulation for Parkinson's.      Medical/Surgical/Interim History Reviewed, no change.    PAST MEDICAL HISTORY, SURGICAL HISTORY, FAMILY HISTORY, SOCIAL HISTORY AND REVIEW OF SYSTEMS I have reviewed the patient's past medical, surgical, family and social history as well as the comprehensive review of systems as included on the [facility] history form dated 11/22/2012, which I have signed.  Family History: Reviewed, no changes.   Patient reports there is no relevant family history.   Social History: Tobacco use reviewed. Reviewed, no changes.     MEDICATIONS(added, continued or stopped this visit):   Started Medication Directions Instruction Stopped   carbidopa ER 25  mg-levodopa 100 mg tablet,extended release      gabapentin 300 mg capsule      hydrochlorothiazide 25 mg tablet      naproxen 500 mg tablet      oxycodone-acetaminophen 5 mg-325 mg tablet      pramipexole 0.5 mg tablet      pravastatin 20 mg tablet      selegiline 5 mg capsule       ALLERGIES:  Ingredient Reaction Medication Name Comment  NO KNOWN ALLERGIES     No known allergies. Reviewed, no changes.   Vitals Date Temp F BP Pulse Ht In Wt Lb BMI BSA Pain Score  09/25/2013  127/73 40 69 177 26.14  0/10     PHYSICAL EXAM General Level of Distress: no acute distress Overall Appearance: normal  Head and Face  Right Left  Fundoscopic Exam:  normal normal    Cardiovascular Cardiac: regular rate and rhythm without murmur  Right Left  Carotid Pulses: normal normal  Respiratory Lungs: clear to auscultation  Neurological Orientation: normal Recent and Remote Memory: normal Attention Span and Concentration:   normal Language: normal Fund of Knowledge: normal  Right Left Sensation: normal normal Upper Extremity Coordination: normal normal  Lower Extremity Coordination: normal normal  Musculoskeletal Gait and Station: normal  Right Left Upper Extremity Muscle Strength: normal normal Lower Extremity Muscle Strength: normal normal Upper Extremity Muscle Tone:  normal normal Lower Extremity Muscle Tone: normal normal  Motor Strength Upper and lower extremity motor strength was tested in the clinically pertinent muscles.  Deep Tendon Reflexes  Right Left Biceps: normal normal Triceps: normal normal Brachiloradialis: normal normal Patellar: normal normal Achilles: normal normal  Cranial Nerves II. Optic Nerve/Visual Fields: normal III. Oculomotor: normal IV. Trochlear: normal V. Trigeminal: normal VI. Abducens: normal VII. Facial: normal VIII. Acoustic/Vestibular: normal IX. Glossopharyngeal: normal X. Vagus: normal XI. Spinal  Accessory: normal XII. Hypoglossal: normal  Motor and other Tests Lhermittes: negative Rhomberg: negative Pronator drift: absent     Right Left Hoffman's: normal normal Clonus: normal normal Babinski: normal normal   Additional Findings:  Patient has rigidity right greater than left with mild tremor on today's evaluation.      IMPRESSION Patient has rigidity bradykinesia and tremor and appears to have right greater than left-sided symptoms symptoms.  He wishes to go ahead with deep brain stimulation.  Completed Orders (this encounter) Order Details Reason Side Interpretation Result Initial Treatment Date Region  Lifestyle education regarding diet Encouraged to eat a well balanced diet and follow up with primary care physician.         Assessment/Plan # Detail Type Description   1. Assessment Parkinson disease (332.0).       2. Assessment BMI 26.0-26.9,ADULT (V85.22).   Plan Orders Today's instructions / counseling include(s) Lifestyle education regarding diet.         Pain Assessment/Treatment Pain Scale: 0/10. Method: Numeric Pain Intensity Scale.  We will coordinate with Dr. Loni Muse tentatively we are looking at bilateral DBS on 7/28 or 7/30 with fiducials placed the week prior.  Orders: Instruction(s)/Education: Assessment Instruction  V85.22 Lifestyle education regarding diet             Provider:  Marchia Meiers. Vertell Limber MD  09/28/2013 04:17 PM Dictation edited by: Marchia Meiers. Vertell Limber    CC Providers: Earnie Larsson MD [address] [place], [place] [address] ----------------------------------------------------------------------------------------------------------------------------------------------------------------------         Electronically signed by Marchia Meiers. Vertell Limber MD on 09/28/2013 04:17 PM

## 2013-12-13 NOTE — Plan of Care (Signed)
Problem: Consults Goal: Diagnosis - Spinal Surgery Outcome: Completed/Met Date Met:  12/13/13 BILATERAL IMPLANTABLE PULSE GENERATOR PLACEMENT

## 2013-12-13 NOTE — Transfer of Care (Signed)
Immediate Anesthesia Transfer of Care Note  Patient: [name]  Procedure(s) Performed: Procedure(s) with comments: Bilateral implantable pulse generator placement (Bilateral) - Bilateral implantable pulse generator placement  Patient Location: PACU  Anesthesia Type:General  Level of Consciousness: awake and alert   Airway & Oxygen Therapy: Patient Spontanous Breathing and Patient connected to nasal cannula oxygen  Post-op Assessment: Report given to PACU RN, Post -op Vital signs reviewed and stable and Patient moving all extremities X 4  Post vital signs: Reviewed and stable  Complications: No apparent anesthesia complications

## 2013-12-13 NOTE — Progress Notes (Signed)
Patient ID: [name], male   DOB: 1950/07/21, 63 y.o.   MRN: [id number]  Awake, alert, conversant. Wife at bedside. MAEW. Chest drsgs honeycomb over Dermabond intact, dry, No erythema or swelling.  Scalp drsgs intact, with old blood, no swelling.  Follow up appt set for Sept 9th at DrStern office for staple removal. To see DrTat late Sept to turn on Deep Brain Stimulator batteries.  Pt & wife verbalize d/c instructions & activity restrictions & incision care. Ok per DrStern to d/c to home. Pt has percocet at home for prn use.  Verdis Prime RN BSN

## 2013-12-13 NOTE — Anesthesia Postprocedure Evaluation (Signed)
  Anesthesia Post-op Note  Patient: [name]  Procedure(s) Performed: Procedure(s) with comments: Bilateral implantable pulse generator placement (Bilateral) - Bilateral implantable pulse generator placement  Patient Location: PACU  Anesthesia Type:General  Level of Consciousness: awake, alert  and oriented  Airway and Oxygen Therapy: Patient Spontanous Breathing and Patient connected to nasal cannula oxygen  Post-op Pain: none  Post-op Assessment: Post-op Vital signs reviewed, Patient's Cardiovascular Status Stable, Respiratory Function Stable, Patent Airway, No signs of Nausea or vomiting and Pain level controlled  Post-op Vital Signs: Reviewed and stable  Last Vitals:  Filed Vitals:   12/13/13 1038  BP: 136/53  Pulse: 49  Temp:   Resp: 11    Complications: No apparent anesthesia complications

## 2013-12-13 NOTE — H&P (Signed)
> [place], [place] [address] Phone: [phone]   Patient ID:   [id number] Patient: [name]  Date of Birth: 03/14/1951 Visit Type: Office Visit   Date: 09/25/2013 09:15 AM Provider: Marchia Meiers. Vertell Limber MD   This 63 year old male presents for Follow Up of Parkinson's Disease.  History of Present Illness: 1.  Follow Up of Parkinson's Disease  [name], a patient of Dr. Marchelle Folks having had lumbar laminectomy in August of 2014, visits to see Dr. Vertell Limber on Dr. Doristine Devoid referral.  He reports that he was dx'ed with Parkinsons disease 51yrs ago & his speech & swallowing have become very problematic. He is interested in Deep Brain Stimulation.   Recently - skin cancer removed left upper arm & right lateral calf - healing well.  Mild Htn managed with HTCZ.  MRI not yet scheduled.  Patient has right greater than left-sided rigidity.  He has reviewed the record for and DVTs regarding deep brain stimulation for Parkinson's disease.  He has had a psychological evaluation.  He has had 15 years of Parkinson's.  He notes problems with speech and swallowing as well as rigidity.  He also notes problems with on and off.  He is interested in pursuing deep brain stimulation for Parkinson's.      Medical/Surgical/Interim History Reviewed, no change.    PAST MEDICAL HISTORY, SURGICAL HISTORY, FAMILY HISTORY, SOCIAL HISTORY AND REVIEW OF SYSTEMS I have reviewed the patient's past medical, surgical, family and social history as well as the comprehensive review of systems as included on the [facility] history form dated 11/22/2012, which I have signed.  Family History: Reviewed, no changes.   Patient reports there is no relevant family history.   Social History: Tobacco use reviewed. Reviewed, no changes.     MEDICATIONS(added, continued or stopped this visit):   Started Medication Directions Instruction Stopped   carbidopa ER  25 mg-levodopa 100 mg tablet,extended release      gabapentin 300 mg capsule      hydrochlorothiazide 25 mg tablet      naproxen 500 mg tablet      oxycodone-acetaminophen 5 mg-325 mg tablet      pramipexole 0.5 mg tablet      pravastatin 20 mg tablet      selegiline 5 mg capsule       ALLERGIES:  Ingredient Reaction Medication Name Comment  NO KNOWN ALLERGIES     No known allergies. Reviewed, no changes.   Vitals Date Temp F BP Pulse Ht In Wt Lb BMI BSA Pain Score  09/25/2013  127/73 40 69 177 26.14  0/10     PHYSICAL EXAM General Level of Distress: no acute distress Overall Appearance: normal  Head and Face  Right Left  Fundoscopic Exam:  normal normal    Cardiovascular Cardiac: regular rate and rhythm without murmur  Right Left  Carotid Pulses: normal normal  Respiratory Lungs: clear to auscultation  Neurological Orientation: normal Recent and Remote Memory: normal Attention Span and Concentration:   normal Language: normal Fund of Knowledge: normal  Right Left Sensation: normal normal Upper Extremity Coordination: normal normal  Lower Extremity Coordination: normal normal  Musculoskeletal Gait and Station: normal  Right Left Upper Extremity Muscle Strength: normal normal Lower Extremity Muscle Strength: normal normal Upper Extremity Muscle Tone:  normal normal Lower Extremity Muscle Tone: normal normal  Motor Strength Upper and lower extremity motor strength was tested in the clinically pertinent muscles.  Deep Tendon Reflexes  Right Left Biceps: normal normal Triceps: normal normal Brachiloradialis: normal normal Patellar: normal normal Achilles: normal normal  Cranial Nerves II. Optic Nerve/Visual Fields: normal III. Oculomotor: normal IV. Trochlear: normal V. Trigeminal: normal VI. Abducens: normal VII. Facial: normal VIII. Acoustic/Vestibular: normal IX. Glossopharyngeal: normal X. Vagus: normal XI. Spinal  Accessory: normal XII. Hypoglossal: normal  Motor and other Tests Lhermittes: negative Rhomberg: negative Pronator drift: absent     Right Left Hoffman's: normal normal Clonus: normal normal Babinski: normal normal   Additional Findings:  Patient has rigidity right greater than left with mild tremor on today's evaluation.      IMPRESSION Patient has rigidity bradykinesia and tremor and appears to have right greater than left-sided symptoms symptoms.  He wishes to go ahead with deep brain stimulation.  Completed Orders (this encounter) Order Details Reason Side Interpretation Result Initial Treatment Date Region  Lifestyle education regarding diet Encouraged to eat a well balanced diet and follow up with primary care physician.         Assessment/Plan # Detail Type Description   1. Assessment Parkinson disease (332.0).       2. Assessment BMI 26.0-26.9,ADULT (V85.22).   Plan Orders Today's instructions / counseling include(s) Lifestyle education regarding diet.         Pain Assessment/Treatment Pain Scale: 0/10. Method: Numeric Pain Intensity Scale.  We will coordinate with Dr. Loni Muse tentatively we are looking at bilateral DBS on 7/28 or 7/30 with fiducials placed the week prior.  Orders: Instruction(s)/Education: Assessment Instruction  V85.22 Lifestyle education regarding diet             Provider:  Marchia Meiers. Vertell Limber MD  09/28/2013 04:17 PM Dictation edited by: Marchia Meiers. Vertell Limber    CC Providers: Earnie Larsson MD [address] [place], [place] [address] ----------------------------------------------------------------------------------------------------------------------------------------------------------------------         Electronically signed by Marchia Meiers. Vertell Limber MD on 09/28/2013 04:17 PM

## 2013-12-13 NOTE — Discharge Summary (Cosign Needed)
Physician Discharge Summary  Patient ID: [name] MRN: [id number] DOB/AGE: November 17, 1950 63 y.o.  Admit date: 12/13/2013 Discharge date: 12/13/2013  Admission Diagnoses:Parkinsons Disease, s/p Stage I bilateral STN DBS electrode placement   Discharge Diagnoses: Parkinsons Disease, s/p Stage I bilateral STN DBS electrode placement s/p Bilateral implantable pulse generator placement  Active Problems:   Parkinson's disease   Discharged Condition: good  Hospital Course: Melrose Nakayama was admitted for surgery for implantable pulse generators placement for Parkinsons.  Following uncomplicated surgery, he recovered in Neuro PACU and transferred to 3500 for observation.   Consults: None  Significant Diagnostic Studies:   Treatments: surgery: Bilateral implantable pulse generator placement   Discharge Exam: Blood pressure 127/67, pulse 44, temperature 98 F (36.7 C), temperature source Oral, resp. rate 18, weight 82.101 kg (181 lb), SpO2 100.00%. Awake, alert, conversant. Wife at bedside. MAEW. Chest drsgs honeycomb over Dermabond intact, dry, No erythema or swelling.  Scalp drsgs intact, with old blood, no swelling.     Verdis Prime RN BSN   Disposition: 01-Home or Self Care Follow up appt set for Sept 9th at DrStern office for staple removal. To see DrTat late Sept to turn on Deep Brain Stimulator batteries.  Pt & wife verbalize d/c instructions & activity restrictions & incision care. Pt has percocet at home for prn use.      Medication List    ASK your doctor about these medications       acetaminophen 325 MG tablet  Commonly known as:  TYLENOL  Take 650 mg by mouth every 6 (six) hours as needed.     amantadine 100 MG capsule  Commonly known as:  SYMMETREL  Take 1 capsule (100 mg total) by mouth 2 (two) times daily.     BLINK TEARS 0.25 % Gel  Generic drug:  Polyethylene Glycol 400  Apply to eye 2 (two) times a week. Both eyes     carbidopa-levodopa 25-100 MG per  tablet  Commonly known as:  SINEMET IR  Take 2-3 tablets by mouth 4 (four) times daily. Take 3 at 9am, 2 at 1 pm, 3 at 5pm, 2 at 9pm     cholecalciferol 1000 UNITS tablet  Commonly known as:  VITAMIN D  Take 1,000 Units by mouth daily.     fluticasone 50 MCG/ACT nasal spray  Commonly known as:  FLONASE  Place 2 sprays into both nostrils daily as needed for allergies.     gabapentin 300 MG capsule  Commonly known as:  NEURONTIN  Take 1 capsule (300 mg total) by mouth 3 (three) times daily.     hydrochlorothiazide 25 MG tablet  Commonly known as:  HYDRODIURIL  Take 1 tablet (25 mg total) by mouth daily.     multivitamin tablet  Take by mouth.     oxyCODONE-acetaminophen 5-325 MG per tablet  Commonly known as:  PERCOCET/ROXICET  Take 1-2 tablets by mouth every 4 (four) hours as needed for moderate pain.     oxyCODONE-acetaminophen 5-325 MG per tablet  Commonly known as:  PERCOCET/ROXICET  Take 1-2 tablets by mouth every 4 (four) hours as needed for severe pain.     pramipexole 0.5 MG tablet  Commonly known as:  MIRAPEX  Take 1 tablet (0.5 mg total) by mouth 3 (three) times daily.     pravastatin 20 MG tablet  Commonly known as:  PRAVACHOL  Take 1 tablet (20 mg total) by mouth every evening.     selegiline 5 MG capsule  Commonly  known as:  ELDEPRYL  Take 1 capsule (5 mg total) by mouth daily.     STYE OP  Apply 1 application to eye 2 (two) times daily as needed (for styes).     tetrahydrozoline 0.05 % ophthalmic solution  Place 1 drop into both eyes 2 (two) times daily as needed (for itchy eyes).         Signed: Verdis Prime 12/13/2013, 5:24 PM

## 2013-12-13 NOTE — Anesthesia Procedure Notes (Signed)
Procedure Name: Intubation Date/Time: 12/13/2013 7:44 AM Performed by: Jacob Moores Pre-anesthesia Checklist: Patient identified, Emergency Drugs available, Suction available and Patient being monitored Patient Re-evaluated:Patient Re-evaluated prior to inductionOxygen Delivery Method: Circle system utilized Preoxygenation: Pre-oxygenation with 100% oxygen Intubation Type: IV induction Ventilation: Mask ventilation without difficulty Laryngoscope Size: 2 and Miller Grade View: Grade I Tube type: Oral Tube size: 7.5 mm Number of attempts: 1 Airway Equipment and Method: Stylet Placement Confirmation: ETT inserted through vocal cords under direct vision,  positive ETCO2 and breath sounds checked- equal and bilateral Secured at: 22 cm Tube secured with: Tape Dental Injury: Teeth and Oropharynx as per pre-operative assessment

## 2013-12-13 NOTE — Brief Op Note (Signed)
12/13/2013  10:03 AM  PATIENT:  [name]  63 y.o. male  PRE-OPERATIVE DIAGNOSIS:  Parkinsons Disease, s/p Stage I bilateral STN DBS electrode placement  POST-OPERATIVE DIAGNOSIS:  Parkinsons Disease, s/p Stage I bilateral STN DBS electrode placement  PROCEDURE:  Procedure(s) with comments: Bilateral implantable pulse generator placement (Bilateral) - Bilateral implantable pulse generator placement  SURGEON:  Surgeon(s) and Role:    * Erline Levine, MD - Primary  PHYSICIAN ASSISTANT:   ASSISTANTS: Poteat, RN   ANESTHESIA:   general  EBL:  Total I/O In: 1200 [I.V.:1200] Out: 50 [Blood:50]  BLOOD ADMINISTERED:none  DRAINS: none   LOCAL MEDICATIONS USED:  LIDOCAINE   SPECIMEN:  No Specimen  DISPOSITION OF SPECIMEN:  N/A  COUNTS:  YES  TOURNIQUET:  * No tourniquets in log *  DICTATION: Patient has implanted bilateral subthalamic stimulator electrodes having recently completed DBS Stage I and now presents for placement of lead extensions and bilateral IPG implantation.  PROCEDURE: Patient was brought to the operating room and GETA anesthesia was induced.  Left upper chest, scalp, neck were prepped with betadine scrub and Duraprep.  Area of planned incision was infiltrated with lidocaine.  Scalp incision was made and the lead extensions were exposed. An incision was made in the left upper chest and a pocket was created.  Extension tunnel was made from scalp to pocket.  PC IPG was placed and attached to lead extensions, which in turn were connected to cranial leads and torqued appropriately.  The IPG  was placed in the pocket.  Wounds were irrigated and closed with 2-0 Vicryl and 3-0 vicryl sutures at the pocket and 2-0 vicryl at the scalp with staples. and dressed with a sterile occlusive dressing.  Counts were correct at the end of the case.  The patient was then repositioned to expose the right side and right upper chest, scalp, neck were prepped with betadine scrub and  Duraprep.  Area of planned incision was infiltrated with lidocaine.  Scalp incision was made and the lead extensions were exposed. An incision was made in the right upper chest and a pocket was created.  Extension tunnel was made from scalp to pocket.  PC IPG was placed and attached to lead extensions, which in turn were connected to cranial leads and torqued appropriately.  The IPG  was placed in the pocket.  Wounds were irrigated and closed with 2-0 Vicryl and 3-0 vicryl sutures at the pocket and 2-0 vicryl at the scalp with staples. and dressed with a sterile occlusive dressing.  Counts were correct at the end of the case.   PLAN OF CARE: Admit for overnight observation  PATIENT DISPOSITION:  PACU - hemodynamically stable.   Delay start of Pharmacological VTE agent (>24hrs) due to surgical blood loss or risk of bleeding: yes

## 2013-12-14 DIAGNOSIS — G20 Parkinson's disease: Secondary | ICD-10-CM | POA: Diagnosis not present

## 2013-12-14 MED ORDER — OXYCODONE-ACETAMINOPHEN 5-325 MG PO TABS: 1.0000 | ORAL_TABLET | ORAL | Status: DC | PRN

## 2013-12-14 NOTE — Progress Notes (Signed)
UR Completed.  Mackie Goon Jane [phone] 12/14/2013  

## 2013-12-14 NOTE — Progress Notes (Signed)
Patient alert and oriented, mae's well, voiding adequate amount of urine, swallowing without difficulty, no c/o pain. Patient discharged home with family. Script and discharged instructions given to patient. Patient and family stated understanding of instructions given.  

## 2013-12-14 NOTE — Progress Notes (Signed)
Occupational Therapy Evaluation Patient Details Name: [name] MRN: [id number] DOB: [birth year] Today's Date: 12/14/2013    History of Present Illness Patient is a 63 y/o male admitted with history of Parkinsons Disease, s/p Stage I bilateral STN DBS electrode placement.   Clinical Impression   Pt appears close to baseline regarding ADL and functional mobility. Pt with history of multiple falls. Discussed recommendations to reduce falls within home during ADL. Family given contact for neuro outpt to use as resource for supportive services regarding Parkinson's Disease. OT signing off. Thank you.     Follow Up Recommendations  No OT follow up;Supervision/Assistance - 24 hour (initially)    Equipment Recommendations  None recommended by OT    Recommendations for Other Services       Precautions / Restrictions Precautions Precautions: Fall      Mobility Bed Mobility Overal bed mobility: Independent                Transfers Overall transfer level: Needs assistance   Transfers: Sit to/from Stand Sit to Stand: Supervision              Balance Overall balance assessment: History of Falls                                          ADL Overall ADL's : At baseline                                       General ADL Comments: Educated on home safety. Recommended installing grab bar for tub to decrease risk of falls. REc that pt use shower chair consistently when bathing - pt reports he uses it occasionally. Discussed reduction of falls. Pt had minimal difficulty maintaing grasp on utensils. Given theratubing to improve ability to manipulate utensils.     Vision                     Perception     Praxis      Pertinent Vitals/Pain Pain Assessment: No/denies pain     Hand Dominance Right   Extremity/Trunk Assessment Upper Extremity Assessment Upper Extremity Assessment: Overall WFL for tasks assessed (remors at  times BUE)   Lower Extremity Assessment Lower Extremity Assessment: Defer to PT evaluation   Cervical / Trunk Assessment Cervical / Trunk Assessment: Other exceptions (hx of spine surgery)   Communication Communication Communication: Other (comment) (speech difficulty assicaited with parkinson's)   Cognition Arousal/Alertness: Awake/alert Behavior During Therapy: Flat affect Overall Cognitive Status: History of cognitive impairments - at baseline Area of Impairment: Problem solving;Safety/judgement             Problem Solving: Slow processing;Requires verbal cues     General Comments       Exercises       Shoulder Instructions      Home Living Family/patient expects to be discharged to:: Private residence Living Arrangements: Spouse/significant other Available Help at Discharge: Family Type of Home: House Home Access: Ramped entrance     Home Layout: Two level;Able to live on main level with bedroom/bathroom Alternate Level Stairs-Number of Steps: 13 steps Alternate Level Stairs-Rails: Right Bathroom Shower/Tub: Tub/shower unit Shower/tub characteristics: Curtain Biochemist, clinical: Handicapped height Bathroom Accessibility: Yes How Accessible: Accessible via walker Home Equipment: Allen - 2 wheels;Shower seat;Hand held  shower head;Grab bars - tub/shower   Additional Comments: Pt lives in one story home with basement.  However pt does go to basement which where shop is located.       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     OT Diagnosis:     OT Problem List:     OT Treatment/Interventions: Self-care/ADL training;DME and/or AE instruction    OT Goals(Current goals can be found in the care plan section) Acute Rehab OT Goals Patient Stated Goal: go home OT Goal Formulation: With patient  OT Frequency:     Barriers to D/C:             Co-evaluation              End of Session Nurse Communication: Mobility status  Activity Tolerance: Patient tolerated treatment well Patient left: in bed;with call bell/phone within reach;with family/visitor present   Time:  -    Charges:  OT General Charges $OT Visit: 1 Procedure OT Evaluation $Initial OT Evaluation Tier I: 1 Procedure G-Codes: OT G-codes **NOT FOR INPATIENT CLASS** Functional Assessment Tool Used: clinical judgement Functional Limitation: Self care Self Care Current Status (S9373): At least 1 percent but less than 20 percent impaired, limited or restricted Self Care Goal Status (S2876): At least 1 percent but less than 20 percent impaired, limited or restricted Self Care Discharge Status [phone]): At least 1 percent but less than 20 percent impaired, limited or restricted  [facility] 12/14/2013, 8:52 AM   [facility], OTR/L  [phone] 12/14/2013

## 2013-12-14 NOTE — Discharge Summary (Signed)
Physician Discharge Summary  Patient ID: [name] MRN: [id number] DOB/AGE: [birth year] 63 y.o.  Admit date: 12/13/2013 Discharge date: 12/14/2013  Admission Diagnoses: Parkinson's disease    Discharge Diagnoses: Same   Discharged Condition: stable  Hospital Course: The patient was admitted on 12/13/2013 and taken to the operating room where the patient underwent deep brain stimulator battery placement. The patient tolerated the procedure well and was taken to the recovery room and then to the floor in stable condition. The hospital course was routine. There were no complications. The wound remained clean dry and intact. Pt had appropriate scalp soreness. No complaints of other pain or new N/T/W. The patient remained afebrile with stable vital signs, and tolerated a regular diet. The patient continued to increase activities, and pain was well controlled with oral pain medications.   Consults: None  Significant Diagnostic Studies:  Results for orders placed during the hospital encounter of 12/02/13  SURGICAL PCR SCREEN      Result Value Ref Range   MRSA, PCR NEGATIVE  NEGATIVE   Staphylococcus aureus NEGATIVE  NEGATIVE  BASIC METABOLIC PANEL      Result Value Ref Range   Sodium 140  137 - 147 mEq/L   Potassium 4.1  3.7 - 5.3 mEq/L   Chloride 101  96 - 112 mEq/L   CO2 27  19 - 32 mEq/L   Glucose, Bld 91  70 - 99 mg/dL   BUN 19  6 - 23 mg/dL   Creatinine, Ser 0.91  0.50 - 1.35 mg/dL   Calcium 9.4  8.4 - 10.5 mg/dL   GFR calc non Af Amer 88 (*) >90 mL/min   GFR calc Af Amer >90  >90 mL/min   Anion gap 12  5 - 15  CBC      Result Value Ref Range   WBC 8.4  4.0 - 10.5 K/uL   RBC 4.82  4.22 - 5.81 MIL/uL   Hemoglobin 15.0  13.0 - 17.0 g/dL   HCT 44.2  39.0 - 52.0 %   MCV 91.7  78.0 - 100.0 fL   MCH 31.1  26.0 - 34.0 pg   MCHC 33.9  30.0 - 36.0 g/dL   RDW 13.8  11.5 - 15.5 %   Platelets 197  150 - 400 K/uL    Dg Chest 2 View  12/06/2013   CLINICAL DATA:  Preop brain  surgery.  History of hypertension.  EXAM: CHEST  2 VIEW  COMPARISON:  None.  FINDINGS: Generous heart size without definite cardiomegaly. Negative aortic contours for age. There is no edema, consolidation, effusion, or pneumothorax.  IMPRESSION: No active cardiopulmonary disease.   Electronically Signed   By: Jorje Guild M.D.   On: 12/06/2013 06:38   Ct Head Wo Contrast  11/28/2013   CLINICAL DATA:  63 year old male with movement disorder for planned deep brain stimulator placement. Stereotactic study for surgical planning. Paralysis agitans. Initial encounter.  EXAM: CT HEAD WITHOUT CONTRAST  TECHNIQUE: Contiguous axial images were obtained from the base of the skull through the vertex without intravenous contrast.  COMPARISON:  Preoperative brain MRI 10/21/2013.  FINDINGS: Four fiducials have been placed into the calvarium and are included on these images. Mild overlying postprocedural changes to the scalp soft tissues. No other acute osseous abnormality.  Visualized paranasal sinuses and mastoids are clear. Visualized orbit soft tissues are within normal limits.  Stable cerebral volume, within normal limits for age. Calcified atherosclerosis at the skull base. No midline shift, ventriculomegaly,  mass effect, evidence of mass lesion, intracranial hemorrhage or evidence of cortically based acute infarction. Gray-white matter differentiation is within normal limits throughout the brain. No suspicious intracranial vascular hyperdensity.  IMPRESSION: Status post placement of 4 fiducials into the calvarium for stereotactic surgical guidance.  Stable, normal for age noncontrast CT appearance of the brain.   Electronically Signed   By: Lars Pinks M.D.   On: 11/28/2013 14:17   Mr Brain Wo Contrast  12/06/2013   CLINICAL DATA:  Status post deep brain stimulator lead placement.  EXAM: MRI HEAD WITHOUT CONTRAST  TECHNIQUE: Multiplanar, multiecho pulse sequences of the brain and surrounding structures were obtained  without intravenous contrast.  COMPARISON:  MR brain 10/21/2013.  Preop CT head 11/28/2013.  FINDINGS: BILATERAL subthalamic nuclei electrodes have been placed from RIGHT and LEFT frontal burr holes. The positioning appears symmetric, and there are no areas of apparent complication. The leads appear extraventricular. No extra-axial fluid collections are observed.  IMPRESSION: Apparent satisfactory BILATERAL deep brain stimulator electrode placement in the subthalamic nuclei. No visible postoperative complication.   Electronically Signed   By: Rolla Flatten M.D.   On: 12/06/2013 14:11    Antibiotics:  Anti-infectives   Start     Dose/Rate Route Frequency Ordered Stop   12/13/13 1145  ceFAZolin (ANCEF) IVPB 1 g/50 mL premix     1 g 100 mL/hr over 30 Minutes Intravenous Every 8 hours 12/13/13 1131 12/13/13 2036   12/13/13 0715  vancomycin (VANCOCIN) 1000 MG powder  Status:  Discontinued    Comments:  Lorane Gell   : cabinet override      12/13/13 0715 12/13/13 1023      Discharge Exam: Blood pressure 100/65, pulse 41, temperature 97.6 F (36.4 C), temperature source Oral, resp. rate 18, weight 82.101 kg (181 lb), SpO2 94.00%. Neurologic: Grossly normal other than his typical manifestations of Parkinson's disease  Incisions okay  Discharge Medications:     Medication List         acetaminophen 325 MG tablet  Commonly known as:  TYLENOL  Take 650 mg by mouth every 6 (six) hours as needed.     amantadine 100 MG capsule  Commonly known as:  SYMMETREL  Take 1 capsule (100 mg total) by mouth 2 (two) times daily.     BLINK TEARS 0.25 % Gel  Generic drug:  Polyethylene Glycol 400  Apply to eye 2 (two) times a week. Both eyes     carbidopa-levodopa 25-100 MG per tablet  Commonly known as:  SINEMET IR  Take 2-3 tablets by mouth 4 (four) times daily. Take 3 at 9am, 2 at 1 pm, 3 at 5pm, 2 at 9pm     cholecalciferol 1000 UNITS tablet  Commonly known as:  VITAMIN D  Take 1,000 Units by  mouth daily.     fluticasone 50 MCG/ACT nasal spray  Commonly known as:  FLONASE  Place 2 sprays into both nostrils daily as needed for allergies.     gabapentin 300 MG capsule  Commonly known as:  NEURONTIN  Take 1 capsule (300 mg total) by mouth 3 (three) times daily.     hydrochlorothiazide 25 MG tablet  Commonly known as:  HYDRODIURIL  Take 1 tablet (25 mg total) by mouth daily.     multivitamin tablet  Take by mouth.     oxyCODONE-acetaminophen 5-325 MG per tablet  Commonly known as:  PERCOCET/ROXICET  Take 1-2 tablets by mouth every 4 (four) hours as needed for moderate  pain.     oxyCODONE-acetaminophen 5-325 MG per tablet  Commonly known as:  PERCOCET/ROXICET  Take 1-2 tablets by mouth every 4 (four) hours as needed for severe pain.     pramipexole 0.5 MG tablet  Commonly known as:  MIRAPEX  Take 1 tablet (0.5 mg total) by mouth 3 (three) times daily.     pravastatin 20 MG tablet  Commonly known as:  PRAVACHOL  Take 1 tablet (20 mg total) by mouth every evening.     selegiline 5 MG capsule  Commonly known as:  ELDEPRYL  Take 1 capsule (5 mg total) by mouth daily.     STYE OP  Apply 1 application to eye 2 (two) times daily as needed (for styes).     tetrahydrozoline 0.05 % ophthalmic solution  Place 1 drop into both eyes 2 (two) times daily as needed (for itchy eyes).        Disposition: Home   Final Dx: Deep brain stimulator battery placed      Discharge Instructions   Call MD for:  difficulty breathing, headache or visual disturbances    Complete by:  As directed      Call MD for:  persistant dizziness or light-headedness    Complete by:  As directed      Call MD for:  persistant nausea and vomiting    Complete by:  As directed      Call MD for:  redness, tenderness, or signs of infection (pain, swelling, redness, odor or green/yellow discharge around incision site)    Complete by:  As directed      Call MD for:  severe uncontrolled pain     Complete by:  As directed      Call MD for:  temperature >100.4    Complete by:  As directed      Diet - low sodium heart healthy    Complete by:  As directed      Discharge instructions    Complete by:  As directed   No strenuous activity     Increase activity slowly    Complete by:  As directed      Remove dressing in 48 hours    Complete by:  As directed            Follow-up Information   Follow up with STERN,JOSEPH D, MD. Schedule an appointment as soon as possible for a visit in 2 weeks.   Specialty:  Neurosurgery   Contact information:   [address] [place] [address] [phone]        Signed: Eustace Moore 12/14/2013, 7:13 AM

## 2013-12-14 NOTE — Discharge Instructions (Signed)
Wound Care Leave incision open to air. You may shower. Do not scrub directly on incision.  Do not put any creams, lotions, or ointments on incision. Activity Walk each and every day, increasing distance each day. No lifting greater than 5 lbs.  Avoid bending, arching, and twisting.   Diet Resume your normal diet.  Return to Work Will be discussed at you follow up appointment. Call Your Doctor If Any of These Occur Redness, drainage, or increase swelling at the wound.  Temperature greater than 101 degrees. Severe pain not relieved by pain medication. Incision starts to come apart. Follow Up Appt Call today for appointment in 3-4 weeks ([phone]) or for problems.  If you have any hardware placed in your spine, you will need an x-ray before your appointment.

## 2013-12-17 ENCOUNTER — Encounter (HOSPITAL_COMMUNITY): Payer: Self-pay | Admitting: Neurosurgery

## 2013-12-26 ENCOUNTER — Encounter (HOSPITAL_COMMUNITY): Payer: Self-pay | Admitting: Neurosurgery

## 2014-01-07 ENCOUNTER — Telehealth: Payer: Self-pay | Admitting: Neurology

## 2014-01-07 NOTE — Telephone Encounter (Signed)
Message copied by Annamaria Helling on Tue Jan 07, 2014  9:03 AM ------      Message from: TAT, Elwood S      Created: Tue Jan 07, 2014  7:46 AM       Luvenia Starch,             Please make sure that pt knows to hold levodopa/PD meds the morning that he comes in for me to turn on stimulator.   ------

## 2014-01-07 NOTE — Telephone Encounter (Signed)
Called patient and made him aware no PD meds tomorrow. He will follow up at appt tomorrow.

## 2014-01-08 ENCOUNTER — Encounter: Payer: Self-pay | Admitting: Neurology

## 2014-01-08 ENCOUNTER — Ambulatory Visit (INDEPENDENT_AMBULATORY_CARE_PROVIDER_SITE_OTHER): Payer: Medicare PPO | Admitting: Neurology

## 2014-01-08 VITALS — BP 130/82 | HR 64 | Ht 69.0 in | Wt 181.0 lb

## 2014-01-08 DIAGNOSIS — R279 Unspecified lack of coordination: Secondary | ICD-10-CM

## 2014-01-08 DIAGNOSIS — G20 Parkinson's disease: Secondary | ICD-10-CM

## 2014-01-08 DIAGNOSIS — G249 Dystonia, unspecified: Secondary | ICD-10-CM

## 2014-01-08 DIAGNOSIS — R471 Dysarthria and anarthria: Secondary | ICD-10-CM

## 2014-01-08 DIAGNOSIS — G20A1 Parkinson's disease without dyskinesia, without mention of fluctuations: Secondary | ICD-10-CM

## 2014-01-08 NOTE — Patient Instructions (Signed)
1. Decrease Mirapex to 1 tablet twice daily.  2. Decrease Carbidopa Levodopa IR to 2 tablets 4 times daily.  3. Discontinue Carbidopa Levodopa CR at night.  4. Keep follow up appt.

## 2014-01-08 NOTE — Procedures (Signed)
DBS Programming was performed.    Total time spent programming was 45 minutes.  Device was turned on as this was a new activation.  Soft start was confirmed to be on at 8.  Impedences were checked and were within normal limits.  Battery was checked and was determined to be functioning normally and not near the end of life. Final settings were as follows:  Left brain electrode:     1-C+           ; Amplitude  2.0   V   ; Pulse width 60 microseconds;   Frequency   130   Hz.  Right brain electrode:     1-C+          ; Amplitude   2.0  V ;  Pulse width 60  microseconds;  Frequency   130    Hz.  Pt and wife shown how to use pt programmer.  Demonstrated understanding.  Told to carry with them at all times.

## 2014-01-08 NOTE — Progress Notes (Signed)
[name] was seen today in the movement disorders clinic for neurologic consultation at the request of Dr. Annette Stable. His PCP is at the [facility] in [place].  The consultation is for the evaluation of Parkinson's disease.  The patient is accompanied by his wife who supplements the history.  The patient previously saw Dr. Jennelle Human at [facility] and I do have several of those records.  Patient was diagnosed with Parkinson's disease about 15 years ago, at the age of [age] years old.  He states that the first sx was not swinging of the R arm and some minor balance changes.  Balance has gotten worse with time.   The patient is currently on carbidopa/levodopa 25/100, 3 at 9am, 2 at 1, 3 at 5pm, 2 at 9 pm and 1 carbidopa/levodopa 25/100 CR at bedtime.  He is also on Mirapex 0.5 mg 3 times a day and selegiline 5 mg daily.  Because of dyskinesia, Dr. Nicki Reaper did talk to him about DBS, but according to Dr. Bary Leriche notes, the patient was skeptical.  He did have a neuropsych evaluation at the [facility] in [place], but it appears that this was more of counseling and no testing.  He did have a nocturnal polysomnogram on 06/20/2010 demonstrating an apnea hypopnea index of 16.  07/11/13 update:  Pt is f/u today re: PD.  This patient is accompanied in the office by his spouse who supplements the history.  He is on carbidopa/levodopa, 3 at 9am, 2 at 1, 3 at 5pm, 2 at 9 pm and takes carbidopa/levodopa CR 25/100  at bedtime.  This is a total daily levodopa dose of 1100 mg per day.  He is on mirapex 0.5 mg three times per day and selegeline 5 mg in the AM.  He states, "I am ready for DBS.  Tell me about what I need to do."  Pt c/o dyskinesias.  Falls on average once per week.  No hallucinations.  No n/v.  No syncope.  States that he saw himself singing at [place] and could not believe how much dyskinesia he had.  He states that when his medications don't work, he "hits a wall."  This is sometimes very unpredictable.  07/24/13:  Present with  wife for on/off test.  Actually went to PT late yesterday afternoon and was off of medication and had a very hard time.  While he has been off medication for 36 hours, his wife reports that she thinks that he has done remarkably well.  There has been minimal tremor and he really hasn't been dragging his leg much.  He expresses desire to proceed with DBS.  10/25/13 update:  Patient presents today with his wife, who supplements the history.  The patient is preop DBS.  He is scheduled for deep brain stimulation surgery on 12/06/2013.  He is scheduled for IPG placement on 12/13/2013.  Since our last visit, he did have neuropsychometric testing.  Results of this were favorable for DBS placement.  It was recommended that he faithfully wear his CPAP.  He just had his preop MRI of the brain done.  Patient is currently taking levodopa 25/100, on the following schedule: 3/2/3/2 and then an additional carbidopa/levodopa 25/100 CR at bedtime.  This is in addition to pramipexole 0.5 mg 3 times a day and selegiline 5 mg in the morning.  Overall doing well.  No hallucinations.  No falls.  Has had some more of his chronic back pain.  01/08/14 update:  The patient  presents today for followup.  He is accompanied by his wife who supplements the history.  The patient underwent bilateral STN DBS on 12/06/2013 with generator placement on 12/13/2013.  The patient's wife reports that his Parkinson's disease symptoms have actually been much better since surgery.  He did go back to taking his previous dose of medication, however.  He is on carbidopa/levodopa 25/100 on the following schedule: 3/2/3/2 and then he takes an additional carbidopa/levodopa 25/100 CR at night.  He is also on Mirapex 0.5 mg 3 times a day as well as selegiline 5 mg in the morning.  He had 2 falls since last visit.  On the one, he actually fell down a few stairs and states that he was actually able to roll onto the carpet.  He did not get hurt.  With the other one,  he states that he was walking on a flat surface and just did not expect there to be a step downward and there was.  Again, he did not get hurt.  Wife reports minor dyskinesia.  They have noted no tremor.  Rigidity has been better.  He has been very sleepy.  Wife thinks that this is because he only sleeps about 4 hours at night because he is trying to do so much during the day.   Neuroimaging has  previously been performed.  It is not available for my review today.  PREVIOUS MEDICATIONS: Sinemet, Sinemet CR, Mirapex and Eldpryl  ALLERGIES:  No Known Allergies  CURRENT MEDICATIONS:    Medication List       This list is accurate as of: 01/08/14  2:15 PM.  Always use your most recent med list.               acetaminophen 325 MG tablet  Commonly known as:  TYLENOL  Take 650 mg by mouth every 6 (six) hours as needed.     amantadine 100 MG capsule  Commonly known as:  SYMMETREL  Take 1 capsule (100 mg total) by mouth 2 (two) times daily.     BLINK TEARS 0.25 % Gel  Generic drug:  Polyethylene Glycol 400  Apply to eye 2 (two) times a week. Both eyes     carbidopa-levodopa 25-100 MG per tablet  Commonly known as:  SINEMET IR  Take 2-3 tablets by mouth 4 (four) times daily. Take 3 at 9am, 2 at 1 pm, 3 at 5pm, 2 at 9pm     cholecalciferol 1000 UNITS tablet  Commonly known as:  VITAMIN D  Take 1,000 Units by mouth daily.     fluticasone 50 MCG/ACT nasal spray  Commonly known as:  FLONASE  Place 2 sprays into both nostrils daily as needed for allergies.     gabapentin 300 MG capsule  Commonly known as:  NEURONTIN  Take 1 capsule (300 mg total) by mouth 3 (three) times daily.     hydrochlorothiazide 25 MG tablet  Commonly known as:  HYDRODIURIL  Take 1 tablet (25 mg total) by mouth daily.     multivitamin tablet  Take by mouth.     pramipexole 0.5 MG tablet  Commonly known as:  MIRAPEX  Take 1 tablet (0.5 mg total) by mouth 3 (three) times daily.     pravastatin 20 MG  tablet  Commonly known as:  PRAVACHOL  Take 1 tablet (20 mg total) by mouth every evening.     selegiline 5 MG capsule  Commonly known as:  ELDEPRYL  Take 1 capsule (5  mg total) by mouth daily.     STYE OP  Apply 1 application to eye 2 (two) times daily as needed (for styes).     tetrahydrozoline 0.05 % ophthalmic solution  Place 1 drop into both eyes 2 (two) times daily as needed (for itchy eyes).         PAST MEDICAL HISTORY:   Past Medical History  Diagnosis Date  . Parkinson's disease     dx'ed 15 years ago  . PTSD (post-traumatic stress disorder)   . Bradycardia   . Hypertension     treated with HCTZ  . Sleep apnea     doesn't use C-pap  . Varicose veins   . Cancer 2013    skin cancer  . Arthritis   . History of chicken pox   . GERD (gastroesophageal reflux disease)   . Dysrhythmia     chronic slow heart rate  . Depression     ptsd  . History of kidney stones   . Headache(784.0)     tension headaches non recent    PAST SURGICAL HISTORY:   Past Surgical History  Procedure Laterality Date  . Cyst removed       from lip as a child  . Skin cancer removed      from ears,   11 lft arm  rt leg 14  . Lumbar laminectomy/decompression microdiscectomy Bilateral 12/14/2012    Procedure: Bilateral lumbar three-four, four-five decompressive laminotomy/foraminotomy;  Surgeon: Charlie Pitter, MD;  Location: [facility];  Service: Neurosurgery;  Laterality: Bilateral;  . Subthalamic stimulator insertion Bilateral 12/06/2013    Procedure: SUBTHALAMIC STIMULATOR INSERTION;  Surgeon: Erline Levine, MD;  Location: [facility];  Service: Neurosurgery;  Laterality: Bilateral;  Bilateral deep brain stimulator placement  . Pulse generator implant Bilateral 12/13/2013    Procedure: Bilateral implantable pulse generator placement;  Surgeon: Erline Levine, MD;  Location: [facility];  Service: Neurosurgery;  Laterality: Bilateral;  Bilateral implantable pulse generator placement     SOCIAL HISTORY:   History   Social History  . Marital Status: Married    Spouse Name: N/A    Number of Children: N/A  . Years of Education: N/A   Occupational History  . disabled     2001, [employer]  .      search and rescue helicopter   Social History Main Topics  . Smoking status: Never Smoker   . Smokeless tobacco: Former Systems developer  . Alcohol Use: Yes     Comment: occasional (twice per month)  . Drug Use: No  . Sexual Activity: Not on file   Other Topics Concern  . 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    Married 2014    FAMILY HISTORY:   Family Status  Relation Status Death Age  . Mother Alive     Arthritis, unknown medical hx  . Father Deceased     Lung Cancer  . Sister Alive     1 and one half sister  . Brother Alive   . Son Alive   . Daughter Alive     2    ROS:  A complete 10 system review of systems was obtained and was unremarkable apart from what is mentioned above.  PHYSICAL EXAMINATION:    VITALS:   Filed Vitals:   01/08/14 1406  BP: 130/82  Pulse:  64  Height: 5\' 9"  (1.753 m)  Weight: 181 lb (82.101 kg)    GEN:  The patient appears stated age and is in NAD.  He is very somnolent. HEENT:  Normocephalic, atraumatic.  The mucous membranes are moist. The superficial temporal arteries are without ropiness or tenderness. Skin: The chest wounds and scalp wounds are all well healed, without erythema or drainage.  He does have a old area of ecchymosis under the left breast (yellow/purplish in color now).  Neurological examination:  Orientation: The patient is alert and oriented x3. Somnolent Cranial nerves: There is good facial symmetry. Pupils are equal round and reactive to light bilaterally. Fundoscopic exam reveals clear margins bilaterally. Extraocular muscles are intact. The visual fields are full to confrontational testing. The speech is fluent but  significantly dysarthric and hypophonic. Soft palate rises symmetrically and there is no tongue deviation. Hearing is intact to conversational tone. Sensation: Sensation is intact to light touch throughout. Motor: Strength is 5/5 in the bilateral upper and lower extremities.   Shoulder shrug is equal and symmetric.  There is no pronator drift. Deep tendon reflexes: Deferred today.  Movement examination: Tone: Mild increase in the LUE/LLE today Abnormal movements: There is no dyskinesia and no tremor today. Coordination: There is very minimal decremation with RAM's, seen primarily in the LUE. Gait and Station: The patient has no difficulty arising out of a deep-seated chair without the use of the hands. The patient's stride length is good.  DBS programming was performed today which is described in more detail on a separate programming procedural notes.  In brief, there was improvement in the rigidity on the left.   ASSESSMENT/PLAN:  1.  Parkinsons disease by hx. He has been seeing Dr. Jennelle Human through the [facility].    -The patient is status post bilateral STN DBS on 12/06/2013 with battery placement on 12/13/2013.  -Decrease Mirapex to 0.5 mg twice a day.  Wonder if this is contributing to hypersomnolence.  He does have sleep apnea, untreated.  He is also not getting very much sleep at night.  -Decrease carbidopa/levodopa 25/100, 2 tablets 4 times per day (total daily levodopa dose will be reduced from 1100 mg per day to 800 mg per day).  -DC nighttime carbidopa/levodopa CR 25/100.  -Patient will be referred to speech therapy for LSVT.  His speech has declined.  This was true even preoperatively.  It did not change post programming. 2.  LBP.   -This has essentially resolved status post lumbar laminectomy in aug, 2014. 3.  OSAS  -We talked about morbidity and mortality associated with untreated sleep apnea.  This probably contributes to EDS.   He was not able to tolerate  the cpap despite trying multiple masks.

## 2014-01-09 ENCOUNTER — Telehealth: Payer: Self-pay | Admitting: Neurology

## 2014-01-09 DIAGNOSIS — G20 Parkinson's disease: Secondary | ICD-10-CM

## 2014-01-09 NOTE — Telephone Encounter (Signed)
Referral faxed to [facility] to outpatient rehab at [phone] with confirmation received. They will contact patient to start speech therapy.

## 2014-01-09 NOTE — Telephone Encounter (Signed)
Message copied by Annamaria Helling on Thu Jan 09, 2014  7:46 AM ------      Message from: TAT, [place]: Wed Jan 08, 2014  3:44 PM       Pt needs referral to speech therapy ([place], I believe is where he lives) for LSVT ------

## 2014-01-10 ENCOUNTER — Other Ambulatory Visit (INDEPENDENT_AMBULATORY_CARE_PROVIDER_SITE_OTHER): Payer: Medicare PPO

## 2014-01-10 ENCOUNTER — Ambulatory Visit: Payer: Medicare PPO | Admitting: Neurology

## 2014-01-10 ENCOUNTER — Other Ambulatory Visit: Payer: Self-pay | Admitting: Family Medicine

## 2014-01-10 DIAGNOSIS — E785 Hyperlipidemia, unspecified: Secondary | ICD-10-CM

## 2014-01-10 DIAGNOSIS — I10 Essential (primary) hypertension: Secondary | ICD-10-CM

## 2014-01-10 LAB — LIPID PANEL
CHOLESTEROL: 157 mg/dL (ref 0–200)
HDL: 44 mg/dL (ref >39.00)
LDL CALC: 93 mg/dL (ref 0–99)
NonHDL: 113
Total CHOL/HDL Ratio: 4
Triglycerides: 100 mg/dL (ref 0.0–149.0)
VLDL: 20 mg/dL (ref 0.0–40.0)

## 2014-01-16 ENCOUNTER — Encounter: Payer: Self-pay | Admitting: Neurology

## 2014-01-16 ENCOUNTER — Ambulatory Visit (INDEPENDENT_AMBULATORY_CARE_PROVIDER_SITE_OTHER): Payer: Medicare PPO | Admitting: Neurology

## 2014-01-16 VITALS — BP 102/58 | HR 68 | Resp 16 | Ht 69.0 in | Wt 185.0 lb

## 2014-01-16 DIAGNOSIS — G20 Parkinson's disease: Secondary | ICD-10-CM

## 2014-01-16 DIAGNOSIS — G4710 Hypersomnia, unspecified: Secondary | ICD-10-CM

## 2014-01-16 DIAGNOSIS — R471 Dysarthria and anarthria: Secondary | ICD-10-CM

## 2014-01-16 DIAGNOSIS — G4733 Obstructive sleep apnea (adult) (pediatric): Secondary | ICD-10-CM

## 2014-01-16 NOTE — Progress Notes (Signed)
[name] was seen today in the movement disorders clinic for neurologic consultation at the request of Dr. Annette Stable. His PCP is at the [facility] in [place].  The consultation is for the evaluation of Parkinson's disease.  The patient is accompanied by his wife who supplements the history.  The patient previously saw Dr. Jennelle Human at [facility] and I do have several of those records.  Patient was diagnosed with Parkinson's disease about 15 years ago, at the age of 74-61 years old.  He states that the first sx was not swinging of the R arm and some minor balance changes.  Balance has gotten worse with time.   The patient is currently on carbidopa/levodopa 25/100, 3 at 9am, 2 at 1, 3 at 5pm, 2 at 9 pm and 1 carbidopa/levodopa 25/100 CR at bedtime.  He is also on Mirapex 0.5 mg 3 times a day and selegiline 5 mg daily.  Because of dyskinesia, Dr. Nicki Reaper did talk to him about DBS, but according to Dr. Bary Leriche notes, the patient was skeptical.  He did have a neuropsych evaluation at the [facility] in [place], but it appears that this was more of counseling and no testing.  He did have a nocturnal polysomnogram on 06/20/2010 demonstrating an apnea hypopnea index of 16.  07/11/13 update:  Pt is f/u today re: PD.  This patient is accompanied in the office by his spouse who supplements the history.  He is on carbidopa/levodopa, 3 at 9am, 2 at 1, 3 at 5pm, 2 at 9 pm and takes carbidopa/levodopa CR 25/100  at bedtime.  This is a total daily levodopa dose of 1100 mg per day.  He is on mirapex 0.5 mg three times per day and selegeline 5 mg in the AM.  He states, "I am ready for DBS.  Tell me about what I need to do."  Pt c/o dyskinesias.  Falls on average once per week.  No hallucinations.  No n/v.  No syncope.  States that he saw himself singing at [place] and could not believe how much dyskinesia he had.  He states that when his medications don't work, he "hits a wall."  This is sometimes very unpredictable.  07/24/13:  Present with  wife for on/off test.  Actually went to PT late yesterday afternoon and was off of medication and had a very hard time.  While he has been off medication for 36 hours, his wife reports that she thinks that he has done remarkably well.  There has been minimal tremor and he really hasn't been dragging his leg much.  He expresses desire to proceed with DBS.  10/25/13 update:  Patient presents today with his wife, who supplements the history.  The patient is preop DBS.  He is scheduled for deep brain stimulation surgery on 12/06/2013.  He is scheduled for IPG placement on 12/13/2013.  Since our last visit, he did have neuropsychometric testing.  Results of this were favorable for DBS placement.  It was recommended that he faithfully wear his CPAP.  He just had his preop MRI of the brain done.  Patient is currently taking levodopa 25/100, on the following schedule: 3/2/3/2 and then an additional carbidopa/levodopa 25/100 CR at bedtime.  This is in addition to pramipexole 0.5 mg 3 times a day and selegiline 5 mg in the morning.  Overall doing well.  No hallucinations.  No falls.  Has had some more of his chronic back pain.  01/08/14 update:  The patient  presents today for followup.  He is accompanied by his wife who supplements the history.  The patient underwent bilateral STN DBS on 12/06/2013 with generator placement on 12/13/2013.  The patient's wife reports that his Parkinson's disease symptoms have actually been much better since surgery.  He did go back to taking his previous dose of medication, however.  He is on carbidopa/levodopa 25/100 on the following schedule: 3/2/3/2 and then he takes an additional carbidopa/levodopa 25/100 CR at night.  He is also on Mirapex 0.5 mg 3 times a day as well as selegiline 5 mg in the morning.  He had 2 falls since last visit.  On the one, he actually fell down a few stairs and states that he was actually able to roll onto the carpet.  He did not get hurt.  With the other one,  he states that he was walking on a flat surface and just did not expect there to be a step downward and there was.  Again, he did not get hurt.  Wife reports minor dyskinesia.  They have noted no tremor.  Rigidity has been better.  He has been very sleepy.  Wife thinks that this is because he only sleeps about 4 hours at night because he is trying to do so much during the day.  01/16/14 update:  Patient returns today, accompanied by his wife who supplements the history.  Overall, the patient has been doing well.  He remains very sleepy.  He is not sure if that is from medication.  He refuses to wear the CPAP.  He is supposed to be on a tablet of carbidopa/levodopa 25/100 per day but has dropped down to 6 on his own.  He is on pramipexole 0.5 mg twice per day.  He does not want to start speech therapy until November, because he is going out of town.  He had one fall on the stairs since last visit.  Reports that he has an old house with small steps.  He denies any dyskinesia.  He has had some swelling in the hands, right greater than left.  He has an appointment to discuss this with his primary care physician.  He denies any tremor.     Neuroimaging has  previously been performed.  It is not available for my review today.  PREVIOUS MEDICATIONS: Sinemet, Sinemet CR, Mirapex and Eldpryl  ALLERGIES:  No Known Allergies  CURRENT MEDICATIONS:    Medication List       This list is accurate as of: 01/16/14 10:40 AM.  Always use your most recent med list.               acetaminophen 325 MG tablet  Commonly known as:  TYLENOL  Take 650 mg by mouth every 6 (six) hours as needed.     amantadine 100 MG capsule  Commonly known as:  SYMMETREL  Take 1 capsule (100 mg total) by mouth 2 (two) times daily.     BLINK TEARS 0.25 % Gel  Generic drug:  Polyethylene Glycol 400  Apply to eye 2 (two) times a week. Both eyes     carbidopa-levodopa 25-100 MG per tablet  Commonly known as:  SINEMET IR  Take 2  tablets by mouth 4 (four) times daily.     cholecalciferol 1000 UNITS tablet  Commonly known as:  VITAMIN D  Take 1,000 Units by mouth daily.     fluticasone 50 MCG/ACT nasal spray  Commonly known as:  FLONASE  Place 2  sprays into both nostrils daily as needed for allergies.     gabapentin 300 MG capsule  Commonly known as:  NEURONTIN  Take 1 capsule (300 mg total) by mouth 3 (three) times daily.     hydrochlorothiazide 25 MG tablet  Commonly known as:  HYDRODIURIL  Take 1 tablet (25 mg total) by mouth daily.     multivitamin tablet  Take by mouth.     pramipexole 0.5 MG tablet  Commonly known as:  MIRAPEX  Take 0.5 mg by mouth 2 (two) times daily.     pravastatin 20 MG tablet  Commonly known as:  PRAVACHOL  Take 1 tablet (20 mg total) by mouth every evening.     selegiline 5 MG capsule  Commonly known as:  ELDEPRYL  Take 1 capsule (5 mg total) by mouth daily.     STYE OP  Apply 1 application to eye 2 (two) times daily as needed (for styes).     tetrahydrozoline 0.05 % ophthalmic solution  Place 1 drop into both eyes 2 (two) times daily as needed (for itchy eyes).         PAST MEDICAL HISTORY:   Past Medical History  Diagnosis Date  . Parkinson's disease     dx'ed 15 years ago  . PTSD (post-traumatic stress disorder)   . Bradycardia   . Hypertension     treated with HCTZ  . Sleep apnea     doesn't use C-pap  . Varicose veins   . Cancer 2013    skin cancer  . Arthritis   . History of chicken pox   . GERD (gastroesophageal reflux disease)   . Dysrhythmia     chronic slow heart rate  . Depression     ptsd  . History of kidney stones   . Headache(784.0)     tension headaches non recent    PAST SURGICAL HISTORY:   Past Surgical History  Procedure Laterality Date  . Cyst removed       from lip as a child  . Skin cancer removed      from ears,   11 lft arm  rt leg 14  . Lumbar laminectomy/decompression microdiscectomy Bilateral 12/14/2012     Procedure: Bilateral lumbar three-four, four-five decompressive laminotomy/foraminotomy;  Surgeon: Charlie Pitter, MD;  Location: [facility];  Service: Neurosurgery;  Laterality: Bilateral;  . Subthalamic stimulator insertion Bilateral 12/06/2013    Procedure: SUBTHALAMIC STIMULATOR INSERTION;  Surgeon: Erline Levine, MD;  Location: [facility];  Service: Neurosurgery;  Laterality: Bilateral;  Bilateral deep brain stimulator placement  . Pulse generator implant Bilateral 12/13/2013    Procedure: Bilateral implantable pulse generator placement;  Surgeon: Erline Levine, MD;  Location: [facility];  Service: Neurosurgery;  Laterality: Bilateral;  Bilateral implantable pulse generator placement    SOCIAL HISTORY:   History   Social History  . Marital Status: Married    Spouse Name: N/A    Number of Children: N/A  . Years of Education: N/A   Occupational History  . disabled     2001, [employer]  .      search and rescue helicopter   Social History Main Topics  . Smoking status: Never Smoker   . Smokeless tobacco: Former Systems developer  . Alcohol Use: Yes     Comment: occasional (twice per month)  . Drug Use: No  . Sexual Activity: Not on file   Other Topics Concern  . Not on file   Social History Narrative  Previous Therapist, art, [employer]   H/o PTSD.  Prev search and rescue work   3 kids from prev relationship.    Married 2014    FAMILY HISTORY:   Family Status  Relation Status Death Age  . Mother Alive     Arthritis, unknown medical hx  . Father Deceased     Lung Cancer  . Sister Alive     1 and one half sister  . Brother Alive   . Son Alive   . Daughter Alive     2    ROS:  A complete 10 system review of systems was obtained and was unremarkable apart from what is mentioned above.  PHYSICAL EXAMINATION:    VITALS:   Filed Vitals:   01/16/14 1037  BP: 102/58  Pulse: 68  Resp: 16  Height: 5\' 9"  (1.753 m)  Weight: 185 lb (83.915 kg)    GEN:  The patient appears stated age and is in  NAD.  He is very somnolent. HEENT:  Normocephalic, atraumatic.  The mucous membranes are moist. The superficial temporal arteries are without ropiness or tenderness. Skin: The chest wounds and scalp wounds are all well healed, without erythema or drainage.   Musculoskeletal: There is some swelling of the hands, right greater than left.  Neurological examination:  Orientation: The patient is alert and oriented x3.  He is really not particularly somnolent this visit, but he was seen in the morning today. Cranial nerves: There is good facial symmetry.  The speech is fluent but significantly dysarthric and hypophonic. Soft palate rises symmetrically and there is no tongue deviation. Hearing is intact to conversational tone. Sensation: Sensation is intact to light touch throughout. Motor: Strength is 5/5 in the bilateral upper and lower extremities.   Shoulder shrug is equal and symmetric.  There is no pronator drift. Deep tendon reflexes: Deferred today.  Movement examination: Tone: Normal tone bilaterally. Abnormal movements: There is no dyskinesia and no tremor today. Coordination: There is very minimal decremation with RAM's, seen primarily in the LUE. Gait and Station: The patient has no difficulty arising out of a deep-seated chair without the use of the hands. The patient's stride length is good.  DBS programming was performed today which is described in more detail on a separate programming procedural notes.     ASSESSMENT/PLAN:  1.  Parkinsons disease by hx. He has been seeing Dr. Jennelle Human through the [facility].    -The patient is status post bilateral STN DBS on 12/06/2013 with battery placement on 12/13/2013.  -Decrease Mirapex to 0.5 mg, half tablet twice a day for one week, then half tablet once a day for one week and then discontinue the medication.  Worry that this medication is contributing to somnolence.  -Continue carbidopa/levodopa 25/100, 2 tablets 3 times  per day (total daily levodopa dose has already been reduced from 1100 mg to 600 mg).  -We will continue selegiline for now, given that this can have activating properties.  -Patient was encouraged to attend speech therapy once he gets back from vacation 2.  LBP.   -This has essentially resolved status post lumbar laminectomy in aug, 2014. 3.  OSAS  -We talked about morbidity and mortality associated with untreated sleep apnea.  This probably contributes to EDS.   He was not able to tolerate the cpap despite trying multiple masks. 4.  Will plan on seeing the patient back in the next 4-5 weeks, sooner should new neurologic issues arise.

## 2014-01-16 NOTE — Procedures (Signed)
DBS Programming was performed.    Total time spent programming was 30 minutes.  Device was confirmed to be on.  Soft start was confirmed to be on at 8.  Impedences were checked and were within normal limits.  Battery was checked and was determined to be functioning normally and not near the end of life. Final settings were as follows:  Left brain electrode:     1-C+           ; Amplitude  2.7   V   ; Pulse width 60 microseconds;   Frequency   130   Hz.  Right brain electrode:     1-C+          ; Amplitude   2.7  V ;  Pulse width 60  microseconds;  Frequency   130    Hz.  Pt and wife shown confirmed use of pt programmer

## 2014-01-16 NOTE — Patient Instructions (Signed)
1. Decrease Carbidopa Levodopa to two tablets three times daily. 2. Decrease Mirapex to 1/2 tablet twice daily for one week and then stop medication. 3. Follow up in 4 weeks.

## 2014-01-17 ENCOUNTER — Ambulatory Visit (INDEPENDENT_AMBULATORY_CARE_PROVIDER_SITE_OTHER): Payer: Medicare PPO | Admitting: Family Medicine

## 2014-01-17 ENCOUNTER — Encounter: Payer: Self-pay | Admitting: Neurology

## 2014-01-17 ENCOUNTER — Encounter: Payer: Self-pay | Admitting: Family Medicine

## 2014-01-17 ENCOUNTER — Telehealth: Payer: Self-pay | Admitting: Neurology

## 2014-01-17 VITALS — BP 112/80 | HR 58 | Temp 97.9°F | Ht 69.0 in | Wt 186.4 lb

## 2014-01-17 DIAGNOSIS — Z1211 Encounter for screening for malignant neoplasm of colon: Secondary | ICD-10-CM

## 2014-01-17 DIAGNOSIS — I10 Essential (primary) hypertension: Secondary | ICD-10-CM

## 2014-01-17 DIAGNOSIS — Z0000 Encounter for general adult medical examination without abnormal findings: Secondary | ICD-10-CM

## 2014-01-17 DIAGNOSIS — Z23 Encounter for immunization: Secondary | ICD-10-CM

## 2014-01-17 DIAGNOSIS — G20 Parkinson's disease: Secondary | ICD-10-CM

## 2014-01-17 DIAGNOSIS — Z7189 Other specified counseling: Secondary | ICD-10-CM

## 2014-01-17 DIAGNOSIS — E785 Hyperlipidemia, unspecified: Secondary | ICD-10-CM

## 2014-01-17 NOTE — Progress Notes (Signed)
Pre visit review using our clinic review tool, if applicable. No additional management support is needed unless otherwise documented below in the visit note.  I have personally reviewed the Medicare Annual Wellness questionnaire and have noted 1. The patient's medical and social history 2. Their use of alcohol, tobacco or illicit drugs 3. Their current medications and supplements 4. The patient's functional ability including ADL's, fall risks, home safety risks and hearing or visual             impairment. 5. Diet and physical activities 6. Evidence for depression or mood disorders  The patients weight, height, BMI have been recorded in the chart and visual acuity is per eye clinic.  I have made referrals, counseling and provided education to the patient based review of the above and I have provided the pt with a written personalized care plan for preventive services.  Provider list updated- see scanned forms.  Routine anticipatory guidance given to patient.  See health maintenance.  Flu d/w pt.  Shingles d/w pt.  PNA due at 64 Tetanus < 10 years per patient D/w patient [id number] for colon cancer screening, including IFOB vs. colonoscopy.  Risks and benefits of both were discussed and patient voiced understanding.  Pt elects for: IFOB.  Prostate cancer screening and PSA options (with potential risks and benefits of testing vs not testing) were discussed along with recent recs/guidelines.  He declined testing PSA at this point. Advance directive- wife designated if patient were incapacitated.   Cognitive function addressed- see scanned forms- and if abnormal then additional documentation follows.   Hypertension:    Using medication without problems or lightheadedness: yes Chest pain with exertion:no Edema: R leg edema at baseline.  Overall improved with cutting salt Short of breath: at baseline  Elevated Cholesterol: Using medications without problems:yes Muscle aches: not likely  from statin Diet compliance:yes Exercise:at tolerated  PD with DBS per neuro.  In midst of med taper.    PMH and SH reviewed  Meds, vitals, and allergies reviewed.   ROS: See HPI.  Otherwise negative.    GEN: nad, alert and oriented HEENT: mucous membranes moist NECK: supple w/o LA CV: rrr. PULM: ctab, no inc wob ABD: soft, +bs EXT: R leg with edema at baseline SKIN: no acute rash No tremor noted, speech at baseline

## 2014-01-17 NOTE — Patient Instructions (Addendum)
Flu shot today.  Pneumonia shot at 3.  Check with your insurance to see if they will cover the shingles shot. Go to the lab on the way out.  We'll contact you with your lab report (stool cards). Let me know if the erectile dysfunction continues after the upcoming med changes per neurology.

## 2014-01-17 NOTE — Telephone Encounter (Signed)
Spoke with patient. He had complaints of dyskinesia since DBS adjustment yesterday. Patient will reduce Levodopa to 2 tablets in the morning, 1 tablet in the afternoon and 1 in the evening and follow up next week. He will call with any problems prior.

## 2014-01-19 DIAGNOSIS — Z7189 Other specified counseling: Secondary | ICD-10-CM | POA: Insufficient documentation

## 2014-01-19 DIAGNOSIS — Z0000 Encounter for general adult medical examination without abnormal findings: Secondary | ICD-10-CM | POA: Insufficient documentation

## 2014-01-19 NOTE — Assessment & Plan Note (Signed)
Controlled, continue as is.  Labs d/w pt.  

## 2014-01-19 NOTE — Assessment & Plan Note (Signed)
Flu d/w pt.  Shingles d/w pt.  PNA due at 64 Tetanus < 10 years per patient D/w patient [id number] for colon cancer screening, including IFOB vs. colonoscopy.  Risks and benefits of both were discussed and patient voiced understanding.  Pt elects for: IFOB.  Prostate cancer screening and PSA options (with potential risks and benefits of testing vs not testing) were discussed along with recent recs/guidelines.  He declined testing PSA at this point. Advance directive- wife designated if patient were incapacitated.   Cognitive function addressed- see scanned forms- and if abnormal then additional documentation follows.

## 2014-01-19 NOTE — Assessment & Plan Note (Signed)
Per neuro.  App help of all involved.

## 2014-01-22 ENCOUNTER — Encounter: Payer: Self-pay | Admitting: Neurology

## 2014-01-22 ENCOUNTER — Ambulatory Visit (INDEPENDENT_AMBULATORY_CARE_PROVIDER_SITE_OTHER): Payer: Medicare PPO | Admitting: Neurology

## 2014-01-22 VITALS — BP 128/66 | HR 64 | Ht 69.0 in | Wt 186.0 lb

## 2014-01-22 DIAGNOSIS — G20 Parkinson's disease: Secondary | ICD-10-CM

## 2014-01-22 DIAGNOSIS — M2550 Pain in unspecified joint: Secondary | ICD-10-CM

## 2014-01-22 DIAGNOSIS — M545 Low back pain, unspecified: Secondary | ICD-10-CM

## 2014-01-22 DIAGNOSIS — G20A1 Parkinson's disease without dyskinesia, without mention of fluctuations: Secondary | ICD-10-CM

## 2014-01-22 DIAGNOSIS — R609 Edema, unspecified: Secondary | ICD-10-CM

## 2014-01-22 DIAGNOSIS — G4733 Obstructive sleep apnea (adult) (pediatric): Secondary | ICD-10-CM

## 2014-01-22 NOTE — Progress Notes (Signed)
[name] was seen today in the movement disorders clinic for neurologic consultation at the request of Dr. Annette Stable. His PCP is at the [facility] in [place].  The consultation is for the evaluation of Parkinson's disease.  The patient is accompanied by his wife who supplements the history.  The patient previously saw Dr. Jennelle Human at [facility] and I do have several of those records.  Patient was diagnosed with Parkinson's disease about 15 years ago, at the age of 74-60 years old.  He states that the first sx was not swinging of the R arm and some minor balance changes.  Balance has gotten worse with time.   The patient is currently on carbidopa/levodopa 25/100, 3 at 9am, 2 at 1, 3 at 5pm, 2 at 9 pm and 1 carbidopa/levodopa 25/100 CR at bedtime.  He is also on Mirapex 0.5 mg 3 times a day and selegiline 5 mg daily.  Because of dyskinesia, Dr. Nicki Reaper did talk to him about DBS, but according to Dr. Bary Leriche notes, the patient was skeptical.  He did have a neuropsych evaluation at the [facility] in [place], but it appears that this was more of counseling and no testing.  He did have a nocturnal polysomnogram on 06/20/2010 demonstrating an apnea hypopnea index of 16.  07/11/13 update:  Pt is f/u today re: PD.  This patient is accompanied in the office by his spouse who supplements the history.  He is on carbidopa/levodopa, 3 at 9am, 2 at 1, 3 at 5pm, 2 at 9 pm and takes carbidopa/levodopa CR 25/100  at bedtime.  This is a total daily levodopa dose of 1100 mg per day.  He is on mirapex 0.5 mg three times per day and selegeline 5 mg in the AM.  He states, "I am ready for DBS.  Tell me about what I need to do."  Pt c/o dyskinesias.  Falls on average once per week.  No hallucinations.  No n/v.  No syncope.  States that he saw himself singing at [place] and could not believe how much dyskinesia he had.  He states that when his medications don't work, he "hits a wall."  This is sometimes very unpredictable.  07/24/13:  Present with  wife for on/off test.  Actually went to PT late yesterday afternoon and was off of medication and had a very hard time.  While he has been off medication for 36 hours, his wife reports that she thinks that he has done remarkably well.  There has been minimal tremor and he really hasn't been dragging his leg much.  He expresses desire to proceed with DBS.  10/25/13 update:  Patient presents today with his wife, who supplements the history.  The patient is preop DBS.  He is scheduled for deep brain stimulation surgery on 12/06/2013.  He is scheduled for IPG placement on 12/13/2013.  Since our last visit, he did have neuropsychometric testing.  Results of this were favorable for DBS placement.  It was recommended that he faithfully wear his CPAP.  He just had his preop MRI of the brain done.  Patient is currently taking levodopa 25/100, on the following schedule: 3/2/3/2 and then an additional carbidopa/levodopa 25/100 CR at bedtime.  This is in addition to pramipexole 0.5 mg 3 times a day and selegiline 5 mg in the morning.  Overall doing well.  No hallucinations.  No falls.  Has had some more of his chronic back pain.  01/08/14 update:  The patient  presents today for followup.  He is accompanied by his wife who supplements the history.  The patient underwent bilateral STN DBS on 12/06/2013 with generator placement on 12/13/2013.  The patient's wife reports that his Parkinson's disease symptoms have actually been much better since surgery.  He did go back to taking his previous dose of medication, however.  He is on carbidopa/levodopa 25/100 on the following schedule: 3/2/3/2 and then he takes an additional carbidopa/levodopa 25/100 CR at night.  He is also on Mirapex 0.5 mg 3 times a day as well as selegiline 5 mg in the morning.  He had 2 falls since last visit.  On the one, he actually fell down a few stairs and states that he was actually able to roll onto the carpet.  He did not get hurt.  With the other one,  he states that he was walking on a flat surface and just did not expect there to be a step downward and there was.  Again, he did not get hurt.  Wife reports minor dyskinesia.  They have noted no tremor.  Rigidity has been better.  He has been very sleepy.  Wife thinks that this is because he only sleeps about 4 hours at night because he is trying to do so much during the day.  01/16/14 update:  Patient returns today, accompanied by his wife who supplements the history.  Overall, the patient has been doing well.  He remains very sleepy.  He is not sure if that is from medication.  He refuses to wear the CPAP.  He is supposed to be on a tablet of carbidopa/levodopa 25/100 per day but has dropped down to 6 on his own.  He is on pramipexole 0.5 mg twice per day.  He does not want to start speech therapy until November, because he is going out of town.  He had one fall on the stairs since last visit.  Reports that he has an old house with small steps.  He denies any dyskinesia.  He has had some swelling in the hands, right greater than left.  He has an appointment to discuss this with his primary care physician.  He denies any tremor.    01/22/14 update:  Patient returns today for followup, accompanied by his wife who supplements the history.  Patient called me almost immediately after last visit complaining about dyskinesia and he wanted me to turn his settings back down.  I had told him to instead to decrease his levodopa up to 2 tablets in the morning, one in the afternoon and one in the evening and we had already been weaning his Mirapex.  That stopped the dyskinesias which were in the R arm and he admits that some days he is only taking 3 levodopa per day.  He is sometimes having what sounds like myoclonic jerking in the R arm.  He also c/o "aching" in the joints all over the body "but it doesn't feel like the stiffness of parkinsons disease."  He still has swelling.  He did f/u with PCP but etiology is unknown.      Neuroimaging has  previously been performed.  It is not available for my review today.  PREVIOUS MEDICATIONS: Sinemet, Sinemet CR, Mirapex and Eldpryl  ALLERGIES:  No Known Allergies  CURRENT MEDICATIONS:    Medication List       This list is accurate as of: 01/22/14  9:06 AM.  Always use your most recent med list.  acetaminophen 325 MG tablet  Commonly known as:  TYLENOL  Take 650 mg by mouth every 6 (six) hours as needed.     amantadine 100 MG capsule  Commonly known as:  SYMMETREL  Take 1 capsule (100 mg total) by mouth 2 (two) times daily.     BLINK TEARS 0.25 % Gel  Generic drug:  Polyethylene Glycol 400  Apply to eye 2 (two) times a week. Both eyes     carbidopa-levodopa 25-100 MG per tablet  Commonly known as:  SINEMET IR  Take by mouth. 2 tabs po in the AM and 1 tab in the afternoon and PM     cholecalciferol 1000 UNITS tablet  Commonly known as:  VITAMIN D  Take 1,000 Units by mouth daily.     fluticasone 50 MCG/ACT nasal spray  Commonly known as:  FLONASE  Place 2 sprays into both nostrils daily as needed for allergies.     gabapentin 300 MG capsule  Commonly known as:  NEURONTIN  Take 1 capsule (300 mg total) by mouth 3 (three) times daily.     hydrochlorothiazide 25 MG tablet  Commonly known as:  HYDRODIURIL  Take 1 tablet (25 mg total) by mouth daily.     multivitamin tablet  Take by mouth.     pramipexole 0.5 MG tablet  Commonly known as:  MIRAPEX  Take 0.25 mg by mouth 2 (two) times daily.     pravastatin 20 MG tablet  Commonly known as:  PRAVACHOL  Take 1 tablet (20 mg total) by mouth every evening.     selegiline 5 MG capsule  Commonly known as:  ELDEPRYL  Take 1 capsule (5 mg total) by mouth daily.     STYE OP  Apply 1 application to eye 2 (two) times daily as needed (for styes).     tetrahydrozoline 0.05 % ophthalmic solution  Place 1 drop into both eyes 2 (two) times daily as needed (for itchy eyes).          PAST MEDICAL HISTORY:   Past Medical History  Diagnosis Date  . Parkinson's disease     dx'ed 15 years ago  . PTSD (post-traumatic stress disorder)   . Bradycardia   . Hypertension     treated with HCTZ  . Sleep apnea     doesn't use C-pap  . Varicose veins   . Cancer 2013    skin cancer  . Arthritis   . History of chicken pox   . GERD (gastroesophageal reflux disease)   . Dysrhythmia     chronic slow heart rate  . Depression     ptsd  . History of kidney stones   . Headache(784.0)     tension headaches non recent    PAST SURGICAL HISTORY:   Past Surgical History  Procedure Laterality Date  . Cyst removed       from lip as a child  . Skin cancer removed      from ears,   12 lft arm  rt leg 14  . Lumbar laminectomy/decompression microdiscectomy Bilateral 12/14/2012    Procedure: Bilateral lumbar three-four, four-five decompressive laminotomy/foraminotomy;  Surgeon: Charlie Pitter, MD;  Location: [facility];  Service: Neurosurgery;  Laterality: Bilateral;  . Subthalamic stimulator insertion Bilateral 12/06/2013    Procedure: SUBTHALAMIC STIMULATOR INSERTION;  Surgeon: Erline Levine, MD;  Location: [facility];  Service: Neurosurgery;  Laterality: Bilateral;  Bilateral deep brain stimulator placement  . Pulse generator implant Bilateral 12/13/2013  Procedure: Bilateral implantable pulse generator placement;  Surgeon: Erline Levine, MD;  Location: [facility];  Service: Neurosurgery;  Laterality: Bilateral;  Bilateral implantable pulse generator placement    SOCIAL HISTORY:   History   Social History  . Marital Status: Married    Spouse Name: N/A    Number of Children: N/A  . Years of Education: N/A   Occupational History  . disabled     2001, [employer]  .      search and rescue helicopter   Social History Main Topics  . Smoking status: Never Smoker   . Smokeless tobacco: Former Systems developer  . Alcohol Use: Yes     Comment: occasional (twice per month)  . Drug Use: No   . Sexual Activity: Not on file   Other Topics Concern  . 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    Married 2014    FAMILY HISTORY:   Family Status  Relation Status Death Age  . Mother Alive     Arthritis, unknown medical hx  . Father Deceased     Lung Cancer  . Sister Alive     1 and one half sister  . Brother Alive   . Son Alive   . Daughter Alive     2    ROS:  A complete 10 system review of systems was obtained and was unremarkable apart from what is mentioned above.  PHYSICAL EXAMINATION:    VITALS:   Filed Vitals:   01/22/14 0903  BP: 128/66  Pulse: 64  Height: 5\' 9"  (1.753 m)  Weight: 186 lb (84.369 kg)    GEN:  The patient appears stated age and is in NAD.  He is very somnolent. HEENT:  Normocephalic, atraumatic.  The mucous membranes are moist. The superficial temporal arteries are without ropiness or tenderness. Skin: The chest wounds and scalp wounds are all well healed, without erythema or drainage.   Musculoskeletal: There is some swelling of the hands, right greater than left.  There is edema of the legs as well (had u/s in the past and neg for DVT)  Neurological examination:  Orientation: The patient is alert and oriented x3.  He is really not particularly somnolent this visit, but he was seen in the morning today. Cranial nerves: There is good facial symmetry.  The speech is fluent but significantly dysarthric and hypophonic. Soft palate rises symmetrically and there is no tongue deviation. Hearing is intact to conversational tone. Sensation: Sensation is intact to light touch throughout. Motor: Strength is 5/5 in the bilateral upper and lower extremities.   Shoulder shrug is equal and symmetric.  There is no pronator drift. Deep tendon reflexes: Deferred today.  Movement examination: Tone: Normal tone bilaterally. Abnormal movements: There is no  dyskinesia and no tremor today. Coordination: There is very minimal decremation with RAM's, seen primarily in the LUE. Gait and Station: The patient has no difficulty arising out of a deep-seated chair without the use of the hands. The patient's stride length is good.  DBS programming was performed today which is described in more detail on a separate programming procedural notes.     ASSESSMENT/PLAN:  1.  Parkinsons disease by hx. He has been seeing Dr. Jennelle Human through the [facility].    -The patient is status post bilateral STN DBS on 12/06/2013 with battery  placement on 12/13/2013.  -continue to wean mirapex..  May be causing edema  -Continue carbidopa/levodopa 25/100, but only on 3-4 tablets per day  -We will continue selegiline for now, given that this can have activating properties.  -Patient was encouraged to attend speech therapy once he gets back from vacation  -d/c amantadine as it was for dyskinesia and no longer having.  Also, may be contributing to edema 2.  LBP.   -This has essentially resolved status post lumbar laminectomy in aug, 2014.  Not sure why still on neurontin.  ? Could also be source of edema (less likely than the above).  Did not want to d/c too many things at once but may wean in the future.  i am also not RX physician.  Pt does state that has run out in past and feels "funny" without it so feels that would need to wean. 3.  Joint Pain, diffuse  -May be from edema.  Weaning meds. 4.  OSAS  -We talked about morbidity and mortality associated with untreated sleep apnea.  This probably contributes to EDS.   He was not able to tolerate the cpap despite trying multiple masks. 5.  Will plan on seeing the patient back in the next 4-5 weeks, sooner should new neurologic issues arise.

## 2014-01-22 NOTE — Patient Instructions (Signed)
1. Stop Amantadine.  2. Keep scheduled appt.

## 2014-01-22 NOTE — Procedures (Signed)
DBS Programming was performed.    Total time spent programming was 30 minutes.  Device was confirmed to be on.  Soft start was confirmed to be on at 8.  Impedences were checked and were within normal limits.  Battery was checked and was determined to be functioning normally and not near the end of life. Final settings were as follows:  Left brain electrode:     1-C+           ; Amplitude  2.6   V   ; Pulse width 60 microseconds;   Frequency   140   Hz.  Right brain electrode:     1-C+          ; Amplitude   2.8  V ;  Pulse width 60  microseconds;  Frequency   130    Hz.  Pt and wife shown confirmed use of pt programmer

## 2014-01-27 ENCOUNTER — Encounter (HOSPITAL_COMMUNITY): Payer: Self-pay | Admitting: Emergency Medicine

## 2014-01-27 DIAGNOSIS — Z85828 Personal history of other malignant neoplasm of skin: Secondary | ICD-10-CM | POA: Insufficient documentation

## 2014-01-27 DIAGNOSIS — R609 Edema, unspecified: Secondary | ICD-10-CM | POA: Diagnosis not present

## 2014-01-27 DIAGNOSIS — F329 Major depressive disorder, single episode, unspecified: Secondary | ICD-10-CM | POA: Insufficient documentation

## 2014-01-27 DIAGNOSIS — M1990 Unspecified osteoarthritis, unspecified site: Secondary | ICD-10-CM | POA: Insufficient documentation

## 2014-01-27 DIAGNOSIS — Z8719 Personal history of other diseases of the digestive system: Secondary | ICD-10-CM | POA: Diagnosis not present

## 2014-01-27 DIAGNOSIS — Z8619 Personal history of other infectious and parasitic diseases: Secondary | ICD-10-CM | POA: Insufficient documentation

## 2014-01-27 DIAGNOSIS — H1032 Unspecified acute conjunctivitis, left eye: Secondary | ICD-10-CM | POA: Insufficient documentation

## 2014-01-27 DIAGNOSIS — Z8679 Personal history of other diseases of the circulatory system: Secondary | ICD-10-CM | POA: Diagnosis not present

## 2014-01-27 DIAGNOSIS — I10 Essential (primary) hypertension: Secondary | ICD-10-CM | POA: Insufficient documentation

## 2014-01-27 DIAGNOSIS — Z87442 Personal history of urinary calculi: Secondary | ICD-10-CM | POA: Diagnosis not present

## 2014-01-27 DIAGNOSIS — Z79899 Other long term (current) drug therapy: Secondary | ICD-10-CM | POA: Diagnosis not present

## 2014-01-27 DIAGNOSIS — G20 Parkinson's disease: Secondary | ICD-10-CM | POA: Diagnosis not present

## 2014-01-27 DIAGNOSIS — R200 Anesthesia of skin: Secondary | ICD-10-CM | POA: Diagnosis present

## 2014-01-27 LAB — CBC WITH DIFFERENTIAL/PLATELET
BASOS PCT: 0 % (ref 0–1)
Basophils Absolute: 0 10*3/uL (ref 0.0–0.1)
Eosinophils Absolute: 0.1 10*3/uL (ref 0.0–0.7)
Eosinophils Relative: 1 % (ref 0–5)
HEMATOCRIT: 44.8 % (ref 39.0–52.0)
HEMOGLOBIN: 15.4 g/dL (ref 13.0–17.0)
LYMPHS ABS: 3.2 10*3/uL (ref 0.7–4.0)
Lymphocytes Relative: 36 % (ref 12–46)
MCH: 31 pg (ref 26.0–34.0)
MCHC: 34.4 g/dL (ref 30.0–36.0)
MCV: 90.1 fL (ref 78.0–100.0)
MONO ABS: 0.5 10*3/uL (ref 0.1–1.0)
MONOS PCT: 6 % (ref 3–12)
NEUTROS ABS: 5 10*3/uL (ref 1.7–7.7)
Neutrophils Relative %: 57 % (ref 43–77)
Platelets: 240 10*3/uL (ref 150–400)
RBC: 4.97 MIL/uL (ref 4.22–5.81)
RDW: 13.4 % (ref 11.5–15.5)
WBC: 8.9 10*3/uL (ref 4.0–10.5)

## 2014-01-27 NOTE — ED Notes (Signed)
Pt. reports bilateral feet numbness/tingling onset 1 week ago and chronic joints pain for several weeks , denies fever or chills, ambulatory , history of Parkinson's Disease , s/p Deep Brain Stimulator surgery last August 2015 .

## 2014-01-28 ENCOUNTER — Emergency Department (HOSPITAL_COMMUNITY)
Admission: EM | Admit: 2014-01-28 | Discharge: 2014-01-28 | Disposition: A | Discharge status: Home / Self Care | Payer: Medicare PPO | Attending: Emergency Medicine | Admitting: Emergency Medicine

## 2014-01-28 ENCOUNTER — Telehealth: Payer: Self-pay | Admitting: Neurology

## 2014-01-28 DIAGNOSIS — G629 Polyneuropathy, unspecified: Secondary | ICD-10-CM

## 2014-01-28 DIAGNOSIS — R1319 Other dysphagia: Secondary | ICD-10-CM

## 2014-01-28 DIAGNOSIS — G20 Parkinson's disease: Secondary | ICD-10-CM

## 2014-01-28 LAB — COMPREHENSIVE METABOLIC PANEL
ALBUMIN: 3.9 g/dL (ref 3.5–5.2)
ALK PHOS: 91 U/L (ref 39–117)
ALT: <5 U/L (ref 0–53)
AST: 20 U/L (ref 0–37)
Anion gap: 16 — ABNORMAL HIGH (ref 5–15)
BILIRUBIN TOTAL: 0.3 mg/dL (ref 0.3–1.2)
BUN: 16 mg/dL (ref 6–23)
CHLORIDE: 99 meq/L (ref 96–112)
CO2: 25 mEq/L (ref 19–32)
CREATININE: 0.71 mg/dL (ref 0.50–1.35)
Calcium: 9.4 mg/dL (ref 8.4–10.5)
GFR calc Af Amer: >90 mL/min (ref >90)
GFR calc non Af Amer: >90 mL/min (ref >90)
Glucose, Bld: 177 mg/dL — ABNORMAL HIGH (ref 70–99)
Potassium: 3.5 mEq/L — ABNORMAL LOW (ref 3.7–5.3)
Sodium: 140 mEq/L (ref 137–147)
Total Protein: 7.8 g/dL (ref 6.0–8.3)

## 2014-01-28 NOTE — Consult Note (Signed)
Consult Reason for Consult: PD  Referring Physician: Dr [facility] ED  CC: worsening of my symptoms  HPI: [name] is an 63 y.o. male male presents to the emergency department with "a laundry list of complaints" per the patient. He reports over the last several days to weeks he has had increasing difficulty standing and walking, mainly related to generalized weakness. Reports increased fatigue, he has been sleeping more. Describes some pins and needles type sensation in bilateral feet. Patient complains of swelling to his extremities, right upper and lower extremity worse than the left ongoing for some time. Patient has noted increased saliva and headache. Patient reports he's having him increasing difficulty with gagging and swallowing. He notices with certain foods it seems to get her up in the back of his throat and is unable to "work his swallowing muscles to get them down". Patient has history of Parkinson's disease, diagnosed 15 years ago. He has a deep brain stimulator placed in August 2015. His neurologist has been slowly decreasing his meds. He is tapering down Mirapex and Sinemet. He recently stopped amantadine. He denies any fever or chills, no nausea vomiting or diarrhea. No neck pain or stiffness. Patient was seen by his neurologist on October 7th and at that time was complaining of some swelling of his extremities and some joint pain.Marland Kitchen He reports since that appointment the symptoms have worsened.   Past Medical History  Diagnosis Date  . Parkinson's disease     dx'ed 15 years ago  . PTSD (post-traumatic stress disorder)   . Bradycardia   . Hypertension     treated with HCTZ  . Sleep apnea     doesn't use C-pap  . Varicose veins   . Cancer 2013    skin cancer  . Arthritis   . History of chicken pox   . GERD (gastroesophageal reflux disease)   . Dysrhythmia     chronic slow heart rate  . Depression     ptsd  . History of kidney stones   . Headache(784.0)     tension  headaches non recent    Past Surgical History  Procedure Laterality Date  . Cyst removed       from lip as a child  . Skin cancer removed      from ears,   12 lft arm  rt leg 14  . Lumbar laminectomy/decompression microdiscectomy Bilateral 12/14/2012    Procedure: Bilateral lumbar three-four, four-five decompressive laminotomy/foraminotomy;  Surgeon: Charlie Pitter, MD;  Location: [facility];  Service: Neurosurgery;  Laterality: Bilateral;  . Subthalamic stimulator insertion Bilateral 12/06/2013    Procedure: SUBTHALAMIC STIMULATOR INSERTION;  Surgeon: Erline Levine, MD;  Location: [facility];  Service: Neurosurgery;  Laterality: Bilateral;  Bilateral deep brain stimulator placement  . Pulse generator implant Bilateral 12/13/2013    Procedure: Bilateral implantable pulse generator placement;  Surgeon: Erline Levine, MD;  Location: [facility];  Service: Neurosurgery;  Laterality: Bilateral;  Bilateral implantable pulse generator placement    Family History  Problem Relation Age of Onset  . Colon cancer Neg Hx   . Prostate cancer Neg Hx     Social History:  reports that he has never smoked. He has quit using smokeless tobacco. He reports that he drinks alcohol. He reports that he does not use illicit drugs.  No Known Allergies  Medications: Prior to Admission:  (Not in a hospital admission)  ROS: Out of a complete 14 system review, the patient  complains of only the following symptoms, and all other reviewed systems are negative.  Physical Examination: Blood pressure 110/59, pulse 45, temperature 98 F (36.7 C), temperature source Oral, resp. rate 16, SpO2 96.00%.  Neurologic Examination Mental Status: Alert, oriented, thought content appropriate.  Speech fluent without evidence of aphasia.  Able to follow 3 step commands without difficulty. Cranial Nerves: II: funduscopic exam wnl bilaterally, visual fields grossly normal, pupils equal, round, reactive to light and  accommodation III,IV, VI: mild bilateral ptosis, extra-ocular motions intact bilaterally V,VII: smile symmetric, facial light touch sensation normal bilaterally. Masked facies VIII: hearing normal bilaterally IX,X: cough reflex present XI: trapezius strength/neck flexion strength normal bilaterally XII: tongue strength normal  Motor: Lifts all extremities symmetrically against light resistance Mild rest tremor in R hand, mild cogwheel rigidity RUE Sensory: Pinprick and light touch intact throughout, bilaterally. Proprioception intact in bilateral LE Deep Tendon Reflexes: 1+ and symmetric throughout Plantars: Right: downgoing   Left: downgoing Gait: deferred due to lead placement  Laboratory Studies:   Basic Metabolic Panel:  Recent Labs Lab 01/27/14 2320  NA 140  K 3.5*  CL 99  CO2 25  GLUCOSE 177*  BUN 16  CREATININE 0.71  CALCIUM 9.4    Liver Function Tests:  Recent Labs Lab 01/27/14 2320  AST 20  ALT <5  ALKPHOS 91  BILITOT 0.3  PROT 7.8  ALBUMIN 3.9   No results found for this basename: LIPASE, AMYLASE,  in the last 168 hours No results found for this basename: AMMONIA,  in the last 168 hours  CBC:  Recent Labs Lab 01/27/14 2320  WBC 8.9  NEUTROABS 5.0  HGB 15.4  HCT 44.8  MCV 90.1  PLT 240    Cardiac Enzymes: No results found for this basename: CKTOTAL, CKMB, CKMBINDEX, TROPONINI,  in the last 168 hours  BNP: No components found with this basename: POCBNP,   CBG: No results found for this basename: GLUCAP,  in the last 168 hours  Microbiology: Results for orders placed during the hospital encounter of 12/02/13  SURGICAL PCR SCREEN     Status: None   Collection Time    12/02/13  9:36 AM      Result Value Ref Range Status   MRSA, PCR NEGATIVE  NEGATIVE Final   Staphylococcus aureus NEGATIVE  NEGATIVE Final   Comment:            The Xpert SA Assay (FDA     approved for NASAL specimens     in patients over 95 years of age),     is one  component of     a comprehensive surveillance     program.  Test performance has     been validated by [employer] for patients greater     than or equal to 57 year old.     It is not intended     to diagnose infection nor to     guide or monitor treatment.    Coagulation Studies: No results found for this basename: LABPROT, INR,  in the last 72 hours  Urinalysis: No results found for this basename: COLORURINE, APPERANCEUR, LABSPEC, PHURINE, GLUCOSEU, HGBUR, BILIRUBINUR, KETONESUR, PROTEINUR, UROBILINOGEN, NITRITE, LEUKOCYTESUR,  in the last 168 hours  Lipid Panel:     Component Value Date/Time   CHOL 157 01/10/2014 0940   TRIG 100.0 01/10/2014 0940   HDL 44.00 01/10/2014 0940   HDL 35* 06/03/2013 1051   CHOLHDL 4 01/10/2014 0940  CHOLHDL 5.3* 06/03/2013 1051   VLDL 20.0 01/10/2014 0940   LDLCALC 93 01/10/2014 0940   LDLCALC 127* 06/03/2013 1051    HgbA1C:  No results found for this basename: HGBA1C    Urine Drug Screen:   No results found for this basename: labopia, cocainscrnur, labbenz, amphetmu, thcu, labbarb    Alcohol Level: No results found for this basename: ETH,  in the last 168 hours  Imaging: No results found.   Assessment/Plan:  63y/o gentleman with history of PD, s/p DBS presenting for initial evaluation of multiple concerns including increased fatigue, gait instability and muscle weakness. Suspect patients symptoms are likely related to titration of DBS and PD medications. Counseled him that his best option is to follow up with his outpatient neurologist for further medication adjustments. If symptoms do not improve with further titration of DBS/meds he may warrant further workup for a possible neuromuscular or peripheral nerve etiology of his symptoms. Could consider EMG/NCS as outpatient.     Jim Like, DO [facility] [phone]  If 7pm- 7am, please page neurology on call as listed in [place]. 01/28/2014, 5:58 AM

## 2014-01-28 NOTE — Telephone Encounter (Signed)
Patient's wife made aware of date for MBE. Aware we will call with EMG appt and she will try to talk with patient about therapy.

## 2014-01-28 NOTE — Discharge Instructions (Signed)
Your symptoms are most likely due to your ongoing medication changes and your stimulator adjustments.  Please follow up with your neurologist-call her office today   Parkinson Disease Parkinson disease is a disorder of the central nervous system, which includes the brain and spinal cord. A person with this disease slowly loses the ability to completely control body movements. Within the brain, there is a group of nerve cells (basal ganglia) that help control movement. The basal ganglia are damaged and do not work properly in a person with Parkinson disease. In addition, the basal ganglia produce and use a brain chemical called dopamine. The dopamine chemical sends messages to other parts of the body to control and coordinate body movements. Dopamine levels are low in a person with Parkinson disease. If the dopamine levels are low, then the body does not receive the correct messages it needs to move normally.  CAUSES  The exact reason why the basal ganglia get damaged is not known. Some medical researchers have thought that infection, genes, environment, and certain medicines may contribute to the cause.  SYMPTOMS   An early symptom of Parkinson disease is often an uncontrolled shaking (tremor) of the hands. The tremor will often disappear when the affected hand is consciously used.  As the disease progresses, walking, talking, getting out of a chair, and new movements become more difficult.  Muscles get stiff and movements become slower.  Balance and coordination become harder.  Depression, trouble swallowing, urinary problems, constipation, and sleep problems can occur.  Later in the disease, memory and thought processes may deteriorate. DIAGNOSIS  There are no specific tests to diagnose Parkinson disease. You may be referred to a neurologist for evaluation. Your caregiver will ask about your medical history, symptoms, and perform a physical exam. Blood tests and imaging tests of your brain may  be performed to rule out other diseases. The imaging tests may include an MRI or a CT scan. TREATMENT  The goal of treatment is to relieve symptoms. Medicines may be prescribed once the symptoms become troublesome. Medicine will not stop the progression of the disease, but medicine can make movement and balance better and help control tremors. Speech and occupational therapy may also be prescribed. Sometimes, surgical treatment of the brain can be done in young people. HOME CARE INSTRUCTIONS  Get regular exercise and rest periods during the day to help prevent exhaustion and depression.  If getting dressed becomes difficult, replace buttons and zippers with Velcro and elastic on your clothing.  Take all medicine as directed by your caregiver.  Install grab bars or railings in your home to prevent falls.  Go to speech or occupational therapy as directed.  Keep all follow-up visits as directed by your caregiver. SEEK MEDICAL CARE IF:  Your symptoms are not controlled with your medicine.  You fall.  You have trouble swallowing or choke on your food. MAKE SURE YOU:  Understand these instructions.  Will watch your condition.  Will get help right away if you are not doing well or get worse. Document Released: 04/01/2000 Document Revised: 07/30/2012 Document Reviewed: 05/04/2011 [facility] Patient Information 2015 [place], [place]. This information is not intended to replace advice given to you by your health care provider. Make sure you discuss any questions you have with your health care provider.

## 2014-01-28 NOTE — Telephone Encounter (Signed)
Spoke with patient's wife and she states that the biggest complaint in numbness/tingling in both feet. He is also having aching in "all his joints" and increased difficulty swallowing. He is taking his Levodopa 3 times daily, but sometimes it is just twice daily. She also states he is only taking his Gabapentin twice daily and I encouraged them to make sure he takes this 3 times a day because this could be a big factor in his tingling of the feet and his balance. She states his balance the last two days has been worse than when he was in the office. The other complaint in swelling in the hands and feet, but they state that this comes and goes. Please advise.

## 2014-01-28 NOTE — Telephone Encounter (Signed)
[name], [name], called back. Please return call at [phone]. This is her cell number / Gayleen Orem,

## 2014-01-28 NOTE — Telephone Encounter (Signed)
Spoke with patient's wife. She states he definitely has not had MBE or EMG within the last two years. MBE set up for 02/05/14 at 11:30 am to arrive at 11:15 am and go to 1st floor radiology at [facility]. His current dose of Neurontin is 300 mg TID and he has 2 days left of Mirapex. Please advise.

## 2014-01-28 NOTE — Telephone Encounter (Signed)
Left message on machine for patient to call back. To see how he is doing after ED evaluation. Awaiting call back.

## 2014-01-28 NOTE — ED Provider Notes (Signed)
CSN: [id number]     Arrival date & time 01/27/14  2255 History   First MD Initiated Contact with Patient 01/28/14 [phone]     Chief Complaint  Patient presents with  . Numbness     (Consider location/radiation/quality/duration/timing/severity/associated sxs/prior Treatment) HPI 63 year old male presents to the emergency department with "a laundry list of complaints" per the patient.  He reports over the last several days, roughly about 4 or 5 he has had increasing difficulties standing and walking secondary to generalized weakness and some right hip pain.  Patient reports that he has been sleeping more than usual.  He reports numbness and tingling to both of his feet in a stocking distribution to about his mid calf.  Patient complains of swelling to his extremities, right upper and lower extremity worse than the left ongoing for some time.  He reports that his eyes are drooping and he cannot hold them open.  He has some burning to his eyes as well.  Patient has noted increased saliva and headache.  Patient reports he's having him increasing difficulty with gagging and swallowing.  He notices with certain foods it seems to get her up in the back of his throat and is unable to "work his swallowing muscles to get them down".   Patient has history of Parkinson's disease, diagnosed 15 years ago.  He has a deep brain stimulator that was activated about 3 weeks ago.  His neurologist, Dr. Fraser Din has been slowly decreasing his meds.  He is tapering off Mirapex and Sinemet.  He stopped amantadine recently.  He denies any fever or chills, no nausea vomiting or diarrhea.  No neck pain or stiffness.  Patient was seen by his neurologist on the seventh, 6 days ago and at that time was complaining of some swelling of his extremities and some joint pain.Marland Kitchen  He reports since that appointment the symptoms have worsened. Past Medical History  Diagnosis Date  . Parkinson's disease     dx'ed 15 years ago  . PTSD (post-traumatic  stress disorder)   . Bradycardia   . Hypertension     treated with HCTZ  . Sleep apnea     doesn't use C-pap  . Varicose veins   . Cancer 2013    skin cancer  . Arthritis   . History of chicken pox   . GERD (gastroesophageal reflux disease)   . Dysrhythmia     chronic slow heart rate  . Depression     ptsd  . History of kidney stones   . Headache(784.0)     tension headaches non recent   Past Surgical History  Procedure Laterality Date  . Cyst removed       from lip as a child  . Skin cancer removed      from ears,   12 lft arm  rt leg 14  . Lumbar laminectomy/decompression microdiscectomy Bilateral 12/14/2012    Procedure: Bilateral lumbar three-four, four-five decompressive laminotomy/foraminotomy;  Surgeon: Charlie Pitter, MD;  Location: [facility];  Service: Neurosurgery;  Laterality: Bilateral;  . Subthalamic stimulator insertion Bilateral 12/06/2013    Procedure: SUBTHALAMIC STIMULATOR INSERTION;  Surgeon: Erline Levine, MD;  Location: [place] [facility];  Service: Neurosurgery;  Laterality: Bilateral;  Bilateral deep brain stimulator placement  . Pulse generator implant Bilateral 12/13/2013    Procedure: Bilateral implantable pulse generator placement;  Surgeon: Erline Levine, MD;  Location: [place] [facility];  Service: Neurosurgery;  Laterality: Bilateral;  Bilateral implantable pulse generator placement  Family History  Problem Relation Age of Onset  . Colon cancer Neg Hx   . Prostate cancer Neg Hx    History  Substance Use Topics  . Smoking status: Never Smoker   . Smokeless tobacco: Former Systems developer  . Alcohol Use: Yes     Comment: occasional (twice per month)    Review of Systems  See History of Present Illness; otherwise all other systems are reviewed and negative   Allergies  Review of patient's allergies indicates no known allergies.  Home Medications   Prior to Admission medications   Medication Sig Start Date End Date Taking? Authorizing Provider  acetaminophen  (TYLENOL) 325 MG tablet Take 650 mg by mouth every 6 (six) hours as needed.   Yes Historical Provider, MD  carbidopa-levodopa (SINEMET IR) 25-100 MG per tablet Take by mouth. 2 tabs po in the AM and 1 tab in the afternoon and PM   Yes Historical Provider, MD  cholecalciferol (VITAMIN D) 1000 UNITS tablet Take 1,000 Units by mouth daily.   Yes Historical Provider, MD  fluticasone (FLONASE) 50 MCG/ACT nasal spray Place 2 sprays into both nostrils daily as needed for allergies.    Yes Historical Provider, MD  gabapentin (NEURONTIN) 300 MG capsule Take 1 capsule (300 mg total) by mouth 3 (three) times daily. 11/29/13  Yes Rebecca S Tat, DO  hydrochlorothiazide (HYDRODIURIL) 25 MG tablet Take 1 tablet (25 mg total) by mouth daily. 11/29/13  Yes Minna Merritts, MD  Multiple Vitamin (MULTIVITAMIN WITH MINERALS) TABS tablet Take 1 tablet by mouth daily.   Yes Historical Provider, MD  Polyethylene Glycol 400 (BLINK TEARS) 0.25 % GEL Apply to eye 2 (two) times a week. Both eyes   Yes Historical Provider, MD  pramipexole (MIRAPEX) 0.5 MG tablet Take 0.25 mg by mouth 2 (two) times daily.  11/29/13  Yes Rebecca S Tat, DO  pravastatin (PRAVACHOL) 20 MG tablet Take 1 tablet (20 mg total) by mouth every evening. 11/29/13  Yes Minna Merritts, MD  selegiline (ELDEPRYL) 5 MG capsule Take 1 capsule (5 mg total) by mouth daily. 11/29/13  Yes Rebecca S Tat, DO  tetrahydrozoline 0.05 % ophthalmic solution Place 1 drop into both eyes 2 (two) times daily as needed (for itchy eyes).   Yes Historical Provider, MD  White Petrolatum-Mineral Oil (STYE OP) Apply 1 application to eye 2 (two) times daily as needed (for styes).   Yes Historical Provider, MD   BP 128/77  Pulse 49  Temp(Src) 98 F (36.7 C) (Oral)  Resp 16  SpO2 99% Physical Exam  Nursing note and vitals reviewed. Constitutional: He is oriented to person, place, and time. He appears well-developed and well-nourished.  HENT:  Head: Normocephalic and atraumatic.   Right Ear: External ear normal.  Left Ear: External ear normal.  Nose: Nose normal.  Mouth/Throat: Oropharynx is clear and moist.  Eyes: EOM are normal. Pupils are equal, round, and reactive to light. Right eye exhibits no discharge. Left eye exhibits no discharge.  Conjunctiva are inflamed and erythematous.  Patient has difficulties keeping his eyes open.  He is able to open them fully, and then they slowly begin to droop again  Neck: Normal range of motion. Neck supple. No JVD present. No tracheal deviation present. No thyromegaly present.  Cardiovascular: Normal rate, regular rhythm, normal heart sounds and intact distal pulses.  Exam reveals no gallop and no friction rub.   No murmur heard. Pulmonary/Chest: Effort normal and breath sounds normal. No stridor. No  respiratory distress. He has no wheezes. He has no rales. He exhibits no tenderness.  Abdominal: Soft. Bowel sounds are normal. He exhibits no distension and no mass. There is no tenderness. There is no rebound and no guarding.  Musculoskeletal: Normal range of motion. He exhibits edema (patient has edema of the hands right worse than left and right leg) and tenderness ( patient has diffuse tenderness to palpation overall joints).  Lymphadenopathy:    He has no cervical adenopathy.  Neurological: He is alert and oriented to person, place, and time. He displays normal reflexes. He exhibits normal muscle tone. Coordination normal.  Patient has a rolling pill tremor of his right hand.  He has some cogwheel rigidity that improves with repetitive motion worse in the upper chest extremities  Skin: Skin is warm and dry. No rash noted. No erythema. No pallor.  Psychiatric: His behavior is normal. Judgment and thought content normal.  Flat affect    ED Course  Procedures (including critical care time) Labs Review Labs Reviewed  COMPREHENSIVE METABOLIC PANEL - Abnormal; Notable for the following:    Potassium 3.5 (*)    Glucose, Bld 177  (*)    Anion gap 16 (*)    All other components within normal limits  CBC WITH DIFFERENTIAL    Imaging Review No results found.   EKG Interpretation None      MDM   Final diagnoses:  Parkinson's disease    63 year old male with history of Parkinson's, in the process of having his meds tapered down as his brain stimulator is titrated up.  He has several minor thinning at type complaints with eyelid droop and generalized weakness and difficulty swallowing.  I feel they're most likely due to to his adjustment of medications.  Will discuss with neuro hospitalist for further insight.  Neurohosp has seen, recommends close f/u with Dr Tat  Kalman Drape, MD 01/28/14 [phone]

## 2014-01-28 NOTE — ED Notes (Signed)
Neurologist at bedside. 

## 2014-01-28 NOTE — Telephone Encounter (Signed)
Order MBE for swallowing if not done lately.  What is neurontin dose?  Sounds like we may need to increase it and do EMG.  Has he had that done before?  Off mirapex yet?

## 2014-01-28 NOTE — ED Notes (Signed)
Pt complaining of lower occipital headache x 3 days. Denies nausea, vomiting, dizziness, blurry vision, or photophobia. Pt also complaining of joint pain. Pt is taking Pravastatin at this time.

## 2014-01-28 NOTE — Telephone Encounter (Signed)
Order EMG as well.  I know he has been resistant to PT, but think he should do it along with swallow therapy when he gets back from vacation.

## 2014-01-30 ENCOUNTER — Other Ambulatory Visit (HOSPITAL_COMMUNITY): Payer: Self-pay | Admitting: Neurology

## 2014-01-30 DIAGNOSIS — R1314 Dysphagia, pharyngoesophageal phase: Secondary | ICD-10-CM

## 2014-02-05 ENCOUNTER — Ambulatory Visit (HOSPITAL_COMMUNITY)
Admission: RE | Admit: 2014-02-05 | Discharge: 2014-02-05 | Disposition: A | Discharge status: Home / Self Care | Payer: Medicare PPO | Source: Ambulatory Visit | Attending: Neurology | Admitting: Neurology

## 2014-02-05 DIAGNOSIS — I10 Essential (primary) hypertension: Secondary | ICD-10-CM | POA: Diagnosis not present

## 2014-02-05 DIAGNOSIS — M1990 Unspecified osteoarthritis, unspecified site: Secondary | ICD-10-CM | POA: Insufficient documentation

## 2014-02-05 DIAGNOSIS — K219 Gastro-esophageal reflux disease without esophagitis: Secondary | ICD-10-CM | POA: Insufficient documentation

## 2014-02-05 DIAGNOSIS — R1313 Dysphagia, pharyngeal phase: Secondary | ICD-10-CM | POA: Diagnosis not present

## 2014-02-05 DIAGNOSIS — G20 Parkinson's disease: Secondary | ICD-10-CM | POA: Diagnosis not present

## 2014-02-05 DIAGNOSIS — F458 Other somatoform disorders: Secondary | ICD-10-CM | POA: Diagnosis present

## 2014-02-05 DIAGNOSIS — F4310 Post-traumatic stress disorder, unspecified: Secondary | ICD-10-CM | POA: Insufficient documentation

## 2014-02-05 DIAGNOSIS — R1319 Other dysphagia: Secondary | ICD-10-CM

## 2014-02-05 DIAGNOSIS — R1314 Dysphagia, pharyngoesophageal phase: Secondary | ICD-10-CM | POA: Diagnosis present

## 2014-02-05 NOTE — Procedures (Signed)
Objective Swallowing Evaluation: Modified Barium Swallowing Study  Patient Details  Name: [name] MRN: [id number] Date of Birth: 09/05/1950  Today's Date: 02/05/2014 Time: 3086-5784 SLP Time Calculation (min): 22 min  Past Medical History:  Past Medical History  Diagnosis Date  . Parkinson's disease     dx'ed 15 years ago  . PTSD (post-traumatic stress disorder)   . Bradycardia   . Hypertension     treated with HCTZ  . Sleep apnea     doesn't use C-pap  . Varicose veins   . Cancer 2013    skin cancer  . Arthritis   . History of chicken pox   . GERD (gastroesophageal reflux disease)   . Dysrhythmia     chronic slow heart rate  . Depression     ptsd  . History of kidney stones   . Headache(784.0)     tension headaches non recent   Past Surgical History:  Past Surgical History  Procedure Laterality Date  . Cyst removed       from lip as a child  . Skin cancer removed      from ears,   12 lft arm  rt leg 15  . Lumbar laminectomy/decompression microdiscectomy Bilateral 12/14/2012    Procedure: Bilateral lumbar three-four, four-five decompressive laminotomy/foraminotomy;  Surgeon: Charlie Pitter, MD;  Location: [place] [facility];  Service: Neurosurgery;  Laterality: Bilateral;  . Subthalamic stimulator insertion Bilateral 12/06/2013    Procedure: SUBTHALAMIC STIMULATOR INSERTION;  Surgeon: Erline Levine, MD;  Location: [place] [facility];  Service: Neurosurgery;  Laterality: Bilateral;  Bilateral deep brain stimulator placement  . Pulse generator implant Bilateral 12/13/2013    Procedure: Bilateral implantable pulse generator placement;  Surgeon: Erline Levine, MD;  Location: [place] [facility];  Service: Neurosurgery;  Laterality: Bilateral;  Bilateral implantable pulse generator placement   HPI:  63 yo male with PMH significant for Parkinson's disease s/p DBS placement August 2015 with dysarthric speech, arthritis, PTSD, and GERD, who presents for an outpatient swallow study due to  increasing difficulty swallowing solids/liquids.     Assessment / Plan / Recommendation Clinical Impression  Dysphagia Diagnosis: Moderate pharyngeal phase dysphagia Clinical impression: Pt presents with a mild sensorimotor based pharyngeal dysphagia, characterized by mild delay in swallow initaition as well as decreased base of tongue retraction and hyolaryngeal movement. The above results in silent penetration of thin liquids both during and after the swallow on residuals, with SLP providing cue for throat clear each time. Of note, while vallecular residuals were present with all consistencies tested, residuals decreased with more solid boluses (question possible esophageal component). Nectar thick liquids were penetrated x1 with reflexive throat clear to expel from the laryngeal vestibule, with no further penetration observed. Recommend a Dys 3 diet and nectar thick liquids with double swallows to reduce residuals and increase airway protection.    Treatment Recommendation  Defer treatment plan to SLP at (Comment) (OP SLP)    Diet Recommendation Dysphagia 3 (Mechanical Soft);Nectar-thick liquid   Liquid Administration via: Cup Medication Administration: Whole meds with puree Supervision: Patient able to self feed;Intermittent supervision to cue for compensatory strategies Compensations: Slow rate;Small sips/bites;Multiple dry swallows after each bite/sip Postural Changes and/or Swallow Maneuvers: Seated upright 90 degrees;Upright 30-60 min after meal    Other  Recommendations Oral Care Recommendations: Oral care BID   Follow Up Recommendations  Outpatient SLP (pt/wife report OP appointment is already scheduled)    Frequency and Duration        Pertinent Vitals/Pain  n/a    SLP Swallow Goals     General Date of Onset:  (August 2015) HPI: 63 yo male with PMH significant for Parkinson's disease s/p DBS placement August 2015 with dysarthric speech, arthritis, PTSD, and GERD, who  presents for an outpatient swallow study due to increasing difficulty swallowing solids/liquids. Type of Study: Modified Barium Swallowing Study Reason for Referral: Objectively evaluate swallowing function Previous Swallow Assessment: none in chart Diet Prior to this Study: Regular;Thin liquids Respiratory Status: Room air History of Recent Intubation: No Behavior/Cognition: Alert;Cooperative;Pleasant mood Oral Cavity - Dentition: Adequate natural dentition Oral Motor / Sensory Function: Within functional limits Self-Feeding Abilities: Able to feed self Patient Positioning: Upright in chair Baseline Vocal Quality: Low vocal intensity Volitional Cough: Strong Volitional Swallow: Able to elicit Anatomy: Within functional limits Pharyngeal Secretions: Not observed secondary MBS    Reason for Referral Objectively evaluate swallowing function   Oral Phase Oral Preparation/Oral Phase Oral Phase: WFL   Pharyngeal Phase Pharyngeal Phase Pharyngeal Phase: Impaired Pharyngeal - Nectar Pharyngeal - Nectar Cup: Delayed swallow initiation;Reduced anterior laryngeal mobility;Reduced laryngeal elevation;Reduced tongue base retraction;Penetration/Aspiration during swallow;Pharyngeal residue - valleculae;Pharyngeal residue - pyriform sinuses Penetration/Aspiration details (nectar cup): Material enters airway, remains ABOVE vocal cords then ejected out (ejected out with reflexive throat clear) Pharyngeal - Thin Pharyngeal - Thin Cup: Delayed swallow initiation;Reduced anterior laryngeal mobility;Reduced laryngeal elevation;Reduced tongue base retraction;Penetration/Aspiration during swallow;Penetration/Aspiration after swallow;Pharyngeal residue - valleculae;Pharyngeal residue - pyriform sinuses Penetration/Aspiration details (thin cup): Material enters airway, remains ABOVE vocal cords and not ejected out (cleared with cued throat clear) Pharyngeal - Solids Pharyngeal - Puree: Delayed swallow  initiation;Reduced anterior laryngeal mobility;Reduced laryngeal elevation;Reduced tongue base retraction;Pharyngeal residue - valleculae Pharyngeal - Mechanical Soft: Delayed swallow initiation;Reduced anterior laryngeal mobility;Reduced laryngeal elevation;Reduced tongue base retraction;Pharyngeal residue - valleculae  Cervical Esophageal Phase    GO    Cervical Esophageal Phase Cervical Esophageal Phase: [facility]    Functional Assessment Tool Used: skilled clinical judgment Functional Limitations: Swallowing Swallow Current Status ([id number]): At least 20 percent but less than 40 percent impaired, limited or restricted Swallow Goal Status [phone]): At least 20 percent but less than 40 percent impaired, limited or restricted Swallow Discharge Status [phone]): At least 20 percent but less than 40 percent impaired, limited or restricted     Germain Osgood, M.A. CCC-SLP [phone]  Germain Osgood 02/05/2014, 12:34 PM

## 2014-02-14 ENCOUNTER — Ambulatory Visit: Payer: Medicare PPO | Admitting: Neurology

## 2014-02-17 ENCOUNTER — Encounter: Payer: Self-pay | Admitting: Neurology

## 2014-02-17 ENCOUNTER — Ambulatory Visit (INDEPENDENT_AMBULATORY_CARE_PROVIDER_SITE_OTHER): Payer: Medicare PPO | Admitting: Neurology

## 2014-02-17 ENCOUNTER — Ambulatory Visit: Payer: Medicare PPO | Admitting: Neurology

## 2014-02-17 VITALS — BP 110/66 | HR 68 | Ht 69.0 in | Wt 188.0 lb

## 2014-02-17 DIAGNOSIS — G20A1 Parkinson's disease without dyskinesia, without mention of fluctuations: Secondary | ICD-10-CM

## 2014-02-17 DIAGNOSIS — G4733 Obstructive sleep apnea (adult) (pediatric): Secondary | ICD-10-CM

## 2014-02-17 DIAGNOSIS — M67912 Unspecified disorder of synovium and tendon, left shoulder: Secondary | ICD-10-CM

## 2014-02-17 DIAGNOSIS — G20 Parkinson's disease: Secondary | ICD-10-CM

## 2014-02-17 DIAGNOSIS — F330 Major depressive disorder, recurrent, mild: Secondary | ICD-10-CM

## 2014-02-17 DIAGNOSIS — R1314 Dysphagia, pharyngoesophageal phase: Secondary | ICD-10-CM

## 2014-02-17 MED ORDER — BUPROPION HCL ER (XL) 150 MG PO TB24: 150.0000 mg | ORAL_TABLET | Freq: Every day | ORAL | Status: DC

## 2014-02-17 NOTE — Procedures (Signed)
DBS Programming was performed.    Total time spent programming was 30 minutes.  Device was confirmed to be on.  Soft start was confirmed to be on at 8.  Impedences were checked and were within normal limits.  Battery was checked and was determined to be functioning normally and not near the end of life. Final settings were as follows:  Left brain electrode:     1-C+           ; Amplitude  2.8   V   ; Pulse width 60 microseconds;   Frequency   140   Hz.  Right brain electrode:     1-C+          ; Amplitude   3.0  V ;  Pulse width 60  microseconds;  Frequency   130    Hz.

## 2014-02-17 NOTE — Progress Notes (Signed)
[name] was seen today in the movement disorders clinic for neurologic consultation at the request of Dr. Annette Stable. His PCP is at the [facility] in [place].  The consultation is for the evaluation of Parkinson's disease.  The patient is accompanied by his wife who supplements the history.  The patient previously saw Dr. Jennelle Mcgee at [facility] and I do have several of those records.  Patient was diagnosed with Parkinson's disease about 15 years ago, at the age of [age] years old.  He states that the first sx was not swinging of the R arm and some minor balance changes.  Balance has gotten worse with time.   The patient is currently on carbidopa/levodopa 25/100, 3 at 9am, 2 at 1, 3 at 5pm, 2 at 9 pm and 1 carbidopa/levodopa 25/100 CR at bedtime.  He is also on Mirapex 0.5 mg 3 times a day and selegiline 5 mg daily.  Because of dyskinesia, Dr. Nicki Mcgee did talk to him about DBS, but according to Dr. Bary Leriche notes, the patient was skeptical.  He did have a neuropsych evaluation at the [facility] in [place], but it appears that this was more of counseling and no testing.  He did have a nocturnal polysomnogram on 06/20/2010 demonstrating an apnea hypopnea index of 16.  07/11/13 update:  Pt is f/u today re: PD.  This patient is accompanied in the office by his spouse who supplements the history.  He is on carbidopa/levodopa, 3 at 9am, 2 at 1, 3 at 5pm, 2 at 9 pm and takes carbidopa/levodopa CR 25/100  at bedtime.  This is a total daily levodopa dose of 1100 mg per day.  He is on mirapex 0.5 mg three times per day and selegeline 5 mg in the AM.  He states, "I am ready for DBS.  Tell me about what I need to do."  Pt c/o dyskinesias.  Falls on average once per week.  No hallucinations.  No n/v.  No syncope.  States that he saw himself singing at [place] and could not believe how much dyskinesia he had.  He states that when his medications don't work, he "hits a wall."  This is sometimes very unpredictable.  07/24/13:  Present with  wife for on/off test.  Actually went to PT late yesterday afternoon and was off of medication and had a very hard time.  While he has been off medication for 36 hours, his wife reports that she thinks that he has done remarkably well.  There has been minimal tremor and he really hasn't been dragging his leg much.  He expresses desire to proceed with DBS.  10/25/13 update:  Patient presents today with his wife, who supplements the history.  The patient is preop DBS.  He is scheduled for deep brain stimulation surgery on 12/06/2013.  He is scheduled for IPG placement on 12/13/2013.  Since our last visit, he did have neuropsychometric testing.  Results of this were favorable for DBS placement.  It was recommended that he faithfully wear his CPAP.  He just had his preop MRI of the brain done.  Patient is currently taking levodopa 25/100, on the following schedule: 3/2/3/2 and then an additional carbidopa/levodopa 25/100 CR at bedtime.  This is in addition to pramipexole 0.5 mg 3 times a day and selegiline 5 mg in the morning.  Overall doing well.  No hallucinations.  No falls.  Has had some more of his chronic back pain.  01/08/14 update:  The patient  presents today for followup.  He is accompanied by his wife who supplements the history.  The patient underwent bilateral STN DBS on 12/06/2013 with generator placement on 12/13/2013.  The patient's wife reports that his Parkinson's disease symptoms have actually been much better since surgery.  He did go back to taking his previous dose of medication, however.  He is on carbidopa/levodopa 25/100 on the following schedule: 3/2/3/2 and then he takes an additional carbidopa/levodopa 25/100 CR at night.  He is also on Mirapex 0.5 mg 3 times a day as well as selegiline 5 mg in the morning.  He had 2 falls since last visit.  On the one, he actually fell down a few stairs and states that he was actually able to roll onto the carpet.  He did not get hurt.  With the other one,  he states that he was walking on a flat surface and just did not expect there to be a step downward and there was.  Again, he did not get hurt.  Wife reports minor dyskinesia.  They have noted no tremor.  Rigidity has been better.  He has been very sleepy.  Wife thinks that this is because he only sleeps about 4 hours at night because he is trying to do so much during the day.  01/16/14 update:  Patient returns today, accompanied by his wife who supplements the history.  Overall, the patient has been doing well.  He remains very sleepy.  He is not sure if that is from medication.  He refuses to wear the CPAP.  He is supposed to be on a tablet of carbidopa/levodopa 25/100 per day but has dropped down to 6 on his own.  He is on pramipexole 0.5 mg twice per day.  He does not want to start speech therapy until November, because he is going out of town.  He had one fall on the stairs since last visit.  Reports that he has an old house with small steps.  He denies any dyskinesia.  He has had some swelling in the hands, right greater than left.  He has an appointment to discuss this with his primary care physician.  He denies any tremor.    01/22/14 update:  Patient returns today for followup, accompanied by his wife who supplements the history.  Patient called me almost immediately after last visit complaining about dyskinesia and he wanted me to turn his settings back down.  I had told him to instead to decrease his levodopa up to 2 tablets in the morning, one in the afternoon and one in the evening and we had already been weaning his Mirapex.  That stopped the dyskinesias which were in the R arm and he admits that some days he is only taking 3 levodopa per day.  He is sometimes having what sounds like myoclonic jerking in the R arm.  He also c/o "aching" in the joints all over the body "but it doesn't feel like the stiffness of parkinsons disease."  He still has swelling.  He did f/u with PCP but etiology is unknown.     02/17/14 update:  This patient is accompanied in the office by his spouse who supplements the history.  Has had several falls.  Almost fell into the fireplace.  [place] backwards going down the stairs.  No serious injuries but still multiple falls.  Generally occur on the stairs.  Wrecked his [place] as missed the brake.  No tremor but did turn off his device  and notes that tremor will immediately return.  Off the mirapex but still sleeping "all the time."  Wife states that no longer doing the things that he is to work to do.  He is less involved with the church, which he used to be very passionate about.  He is not exercising like he was.  He started speech therapy today.  He had a  Modified barium swallow on 02/05/2014 that demonstrated moderate pharyngeal phase dysphagia and recommended a dysphagia 3 diet (mechanical soft) with nectar thick liquids.  They just got the nectar thick-it today.  His wife thinks that perhaps he is depressed.  She thinks that maybe that is why he is sleeping all the time.the patient also asked me about a referral to orthopedics for a rotator cuff problem on the left.  He is having severe pain, but does not necessarily want surgery.    Neuroimaging has  previously been performed.  It is not available for my review today.  PREVIOUS MEDICATIONS: Sinemet, Sinemet CR, Mirapex and Eldpryl  ALLERGIES:  No Known Allergies  CURRENT MEDICATIONS:    Medication List       This list is accurate as of: 02/17/14 12:53 PM.  Always use your most recent med list.               acetaminophen 325 MG tablet  Commonly known as:  TYLENOL  Take 650 mg by mouth every 6 (six) hours as needed.     BLINK TEARS 0.25 % Gel  Generic drug:  Polyethylene Glycol 400  Apply to eye 2 (two) times a week. Both eyes     carbidopa-levodopa 25-100 MG per tablet  Commonly known as:  SINEMET IR  Take by mouth. 2 tabs po in the AM and 1 tab in the afternoon and PM     cholecalciferol 1000 UNITS  tablet  Commonly known as:  VITAMIN D  Take 1,000 Units by mouth daily.     fluticasone 50 MCG/ACT nasal spray  Commonly known as:  FLONASE  Place 2 sprays into both nostrils daily as needed for allergies.     gabapentin 300 MG capsule  Commonly known as:  NEURONTIN  Take 1 capsule (300 mg total) by mouth 3 (three) times daily.     hydrochlorothiazide 25 MG tablet  Commonly known as:  HYDRODIURIL  Take 1 tablet (25 mg total) by mouth daily.     multivitamin with minerals Tabs tablet  Take 1 tablet by mouth daily.     pravastatin 20 MG tablet  Commonly known as:  PRAVACHOL  Take 1 tablet (20 mg total) by mouth every evening.     selegiline 5 MG capsule  Commonly known as:  ELDEPRYL  Take 1 capsule (5 mg total) by mouth daily.     STYE OP  Apply 1 application to eye 2 (two) times daily as needed (for styes).     tetrahydrozoline 0.05 % ophthalmic solution  Place 1 drop into both eyes 2 (two) times daily as needed (for itchy eyes).         PAST MEDICAL HISTORY:   Past Medical History  Diagnosis Date  . Parkinson's disease     dx'ed 15 years ago  . PTSD (post-traumatic stress disorder)   . Bradycardia   . Hypertension     treated with HCTZ  . Sleep apnea     doesn't use C-pap  . Varicose veins   . Cancer 2013    skin cancer  .  Arthritis   . History of chicken pox   . GERD (gastroesophageal reflux disease)   . Dysrhythmia     chronic slow heart rate  . Depression     ptsd  . History of kidney stones   . Headache(784.0)     tension headaches non recent    PAST SURGICAL HISTORY:   Past Surgical History  Procedure Laterality Date  . Cyst removed       from lip as a child  . Skin cancer removed      from ears,   11 lft arm  rt leg 14  . Lumbar laminectomy/decompression microdiscectomy Bilateral 12/14/2012    Procedure: Bilateral lumbar three-four, four-five decompressive laminotomy/foraminotomy;  Surgeon: Charlie Pitter, MD;  Location: [facility];   Service: Neurosurgery;  Laterality: Bilateral;  . Subthalamic stimulator insertion Bilateral 12/06/2013    Procedure: SUBTHALAMIC STIMULATOR INSERTION;  Surgeon: Erline Levine, MD;  Location: [facility];  Service: Neurosurgery;  Laterality: Bilateral;  Bilateral deep brain stimulator placement  . Pulse generator implant Bilateral 12/13/2013    Procedure: Bilateral implantable pulse generator placement;  Surgeon: Erline Levine, MD;  Location: [facility];  Service: Neurosurgery;  Laterality: Bilateral;  Bilateral implantable pulse generator placement    SOCIAL HISTORY:   History   Social History  . Marital Status: Married    Spouse Name: N/A    Number of Children: N/A  . Years of Education: N/A   Occupational History  . disabled     2001, [employer]  .      search and rescue helicopter   Social History Main Topics  . Smoking status: Never Smoker   . Smokeless tobacco: Former Systems developer  . Alcohol Use: Yes     Comment: occasional (twice per month)  . Drug Use: No  . Sexual Activity: Not on file   Other Topics Concern  . 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    Married 2014    FAMILY HISTORY:   Family Status  Relation Status Death Age  . Mother Alive     Arthritis, unknown medical hx  . Father Deceased     Lung Cancer  . Sister Alive     1 and one half sister  . Brother Alive   . Son Alive   . Daughter Alive     2    ROS:  A complete 10 system review of systems was obtained and was unremarkable apart from what is mentioned above.  PHYSICAL EXAMINATION:    VITALS:   Filed Vitals:   02/17/14 1246  BP: 110/66  Pulse: 68  Height: 5\' 9"  (1.753 m)  Weight: 188 lb (85.276 kg)    GEN:  The patient appears stated age and is in NAD.   HEENT:  Normocephalic, atraumatic.  The mucous membranes are moist. The superficial temporal arteries are without ropiness or tenderness. Skin: The  chest wounds and scalp wounds are all well healed, without erythema or drainage.   Musculoskeletal: There is some swelling of the hands, right greater than left.  There is less edema of the legs than in the past.  Neurological examination:  Orientation: The patient is alert and oriented x3.  He is really not particularly somnolent this visit Cranial nerves: There is good facial symmetry.  The speech is fluent but significantly dysarthric and hypophonic (baseline  and started ST today). Soft palate rises symmetrically and there is no tongue deviation. Hearing is intact to conversational tone. Sensation: Sensation is intact to light touch throughout. Motor: Strength is 5/5 in the bilateral upper and lower extremities.   Shoulder shrug is equal and symmetric.  There is no pronator drift. Deep tendon reflexes: Deferred today.  Movement examination: Tone: Normal tone bilaterally. Abnormal movements: There is some minor dyskinesia in the legs Coordination: There is no significant decremation with RAM's Gait and Station: The patient has no difficulty arising out of a deep-seated chair without the use of the hands. The patient's stride length is good.  Minimally ataxic.  No shuffling.    DBS programming was performed today which is described in more detail on a separate programming procedural notes.   ASSESSMENT/PLAN:  1.  Parkinsons disease by hx. He has been seeing Dr. Jennelle Mcgee through the [facility]    -The patient is status post bilateral STN DBS on 12/06/2013 with battery placement on 12/13/2013.  -off of mirapex  -Continue carbidopa/levodopa 25/100, but only on 3-4 tablets per day  -We will continue selegiline for now, given that this can have activating properties.  -Patient was encouraged to attend speech therapy (LSVT loud) and will add LSVT big 2.  LBP.   -This has essentially resolved status post lumbar laminectomy in aug, 2014.   3.  Dysphagia  -He had a  Modified  barium swallow on 02/05/2014 that demonstrated moderate pharyngeal phase dysphagia and recommended a dysphagia 3 diet (mechanical soft) with nectar thick liquids.   4.  OSAS  -We talked about morbidity and mortality associated with untreated sleep apnea.  This probably contributes to EDS.   He was not able to tolerate the cpap despite trying multiple masks. 5.  Depression  -Start Wellbutrin XL 150 mg daily.  Call me in 1 month to let me know how he is doing.  May need to increase the dose.  Not suicidal or homicidal.  Talked about the importance of getting back involved with prior activities. 6.  Left rotator cuff tendinitis versus tear  -Refer to Dr. Ricki Rodriguez 7.  Will plan on seeing the patient back in the next 8 weeks, sooner should new neurologic issues arise.

## 2014-02-27 ENCOUNTER — Telehealth: Payer: Self-pay | Admitting: Neurology

## 2014-02-27 NOTE — Telephone Encounter (Signed)
Referral to Dr Wynelle Link for left rotator cuff pain faxed on 02/17/2014 to [phone] with confirmation received. They will contact patient.

## 2014-03-03 ENCOUNTER — Telehealth: Payer: Self-pay | Admitting: Neurology

## 2014-03-03 ENCOUNTER — Encounter: Payer: Self-pay | Admitting: Family Medicine

## 2014-03-03 NOTE — Telephone Encounter (Signed)
[name], [name] called. States she is calling regarding a referral for his shoulders and also to discuss concerns of a facial tic that is bothering [place]. Confirmed CB# [phone] / Sherri S.

## 2014-03-03 NOTE — Telephone Encounter (Signed)
Does it seem to be medication related at all?  If not, then have him turn off the L chest DBS just for a few hours and see if it changes (should notice quick if it is a constant thing) and then let me know the following day

## 2014-03-03 NOTE — Telephone Encounter (Signed)
Spoke with wife and made aware referral faxed on 02/17/2014. Spoke with [place] who gave me an alternate fax number and referral sent again with confirmation received to [phone]. They will contact patient tomorrow.   He states for the past week he has had a constant tic in the right side of his face. They describe this as a tightening of the muscles of the right side of the face. Aware Dr Tat not available until tomorrow and I will contact them then. Please advice.

## 2014-03-04 ENCOUNTER — Telehealth: Payer: Self-pay | Admitting: Neurology

## 2014-03-04 NOTE — Telephone Encounter (Signed)
[name], pt's wife called/returning your call at 1:04PM. C/B [phone]

## 2014-03-04 NOTE — Telephone Encounter (Signed)
Left message on machine for patient's wife to call back.   

## 2014-03-04 NOTE — Telephone Encounter (Signed)
Spoke with patient's wife and she wanted to make sure that orthopaedic doctor new that patient could not have MR. I made her aware to just tell them at their appt today. She agrees with this plan.

## 2014-03-04 NOTE — Telephone Encounter (Signed)
Not related to medication, as they state it is constant. They will turn off left chest DBS this afternoon after his orthopaedic appt and call to morrow to let us know how he does.

## 2014-03-04 NOTE — Telephone Encounter (Signed)
Left message on machine for patient to call back.

## 2014-03-04 NOTE — Telephone Encounter (Signed)
[name], pt's wife called/returning your call at 9:43AM. C/B [phone]

## 2014-03-04 NOTE — Telephone Encounter (Signed)
Wife Candie called to speak w/ Jade. CB# [phone] / Sherri S.

## 2014-03-05 ENCOUNTER — Telehealth: Payer: Self-pay | Admitting: Neurology

## 2014-03-05 NOTE — Telephone Encounter (Signed)
Spoke with wife and they will come to the office on 03/11/2014 at 10:00 am to meet with [employer] Rep to look at DBS.

## 2014-03-05 NOTE — Telephone Encounter (Signed)
Seem to be at all related to the levodopa timing or is it all the time?  Its good to know (and for them to know) that the DBS is working well.  If DBS needs to be increased, may need to see if rep has any time to look at it as my schedule is pretty full before thanksgiving.

## 2014-03-05 NOTE — Telephone Encounter (Signed)
Pt wife [employer] and states that she is returning your call please call her at [phone]

## 2014-03-05 NOTE — Telephone Encounter (Signed)
Spoke with patient's wife and she states they turned off DBS and the facial twitching immediately got worse and all his other symptoms came back. They only left this off for a few minutes and had to turn it back on. Please advise.

## 2014-03-11 ENCOUNTER — Telehealth: Payer: Self-pay | Admitting: Neurology

## 2014-03-11 MED ORDER — BUPROPION HCL ER (XL) 300 MG PO TB24: 300.0000 mg | ORAL_TABLET | Freq: Every day | ORAL | Status: DC

## 2014-03-11 NOTE — Telephone Encounter (Signed)
Okay to increase to wellbutrin xl 300 mg daily

## 2014-03-11 NOTE — Telephone Encounter (Signed)
New RX sent to [facility] per patient request.

## 2014-03-11 NOTE — Telephone Encounter (Signed)
Spoke with patient's wife and she states the antidepressants seem to be helping, but think that he could increase the dosage. Okay to increase this? If yes, they would like a 90 day supply sent to mail order pharmacy. Patient is doing better with shoulder pain after injection from orthopaedic MD and is doing well with speech therapy. He has physical therapy scheduled to start soon.

## 2014-03-17 ENCOUNTER — Ambulatory Visit (INDEPENDENT_AMBULATORY_CARE_PROVIDER_SITE_OTHER): Payer: Medicare PPO | Admitting: Neurology

## 2014-03-17 ENCOUNTER — Telehealth: Payer: Self-pay | Admitting: Neurology

## 2014-03-17 DIAGNOSIS — M5417 Radiculopathy, lumbosacral region: Secondary | ICD-10-CM

## 2014-03-17 DIAGNOSIS — G629 Polyneuropathy, unspecified: Secondary | ICD-10-CM

## 2014-03-17 NOTE — Telephone Encounter (Signed)
Patient made aware to stop Selegiline. They will let us know if he has any problems.

## 2014-03-17 NOTE — Telephone Encounter (Signed)
Got message from answering service that pts pharmacy called over holiday re: wellbutrin and selegeline.  Tell pt to go ahead and d/c the selegeline.  Don't think that he needs it anymore.

## 2014-03-17 NOTE — Telephone Encounter (Signed)
Left message on machine for patient to call back.

## 2014-03-17 NOTE — Procedures (Signed)
[facility]  [address]  [place], [place] [address] Tel: [phone] Fax:  [phone] Test Date:  03/17/2014  Patient: [name] DOB: [birth year] Physician: Narda Amber, DO  Sex: Male Height: 5\' 9"  Ref Phys: Alonza Bogus  ID#: [id number]   Technician: Laureen Ochs R. NCS T.   Patient Complaints: Patient is a 63 year old male here for evaluation of numbness, tingling and balance problems in legs and feet right worse than left.  NCV & EMG Findings: Extensive electrodiagnostic testing of the right lower extremity and additional studies of the left shows:  1. Bilateral sural and superficial peroneal sensory responses are within normal limits. 2. Right peroneal motor response is markedly reduced recording at the extensor digitorum brevis, however, when recording at the tibialis anterior, peroneal motor responses within normal limits. The right tibial motor responses within normal limits. On the left lower extremity, peroneal motor responses within normal limits, and the tibial motor response is reduced. Motor conduction velocities are borderline slowed when recording at the distal muscles bilaterally. 3. Chronic motor axon loss changes are seen affecting all the tested muscles of the lower extremity involving L2-S1 myotomes bilaterally. There is no accompanied active denervation..   Impression: 1. Chronic multilevel lumbosacral radiculopathy affecting the L2-S1 nerve root/segments bilaterally. These findings are moderate in degree electrically and worse at the right L5 and left S1 nerve root levels. 2. There is no evidence of a generalized sensorimotor polyneuropathy affecting the lower extremities.   ___________________________ Narda Amber, DO    Nerve Conduction Studies Anti Sensory Summary Table   Site NR Peak (ms) Norm Peak (ms) P-T Amp (V) Norm P-T Amp  Left Sup Peroneal Anti Sensory (Ant Lat Mall)  31C  12 cm    2.8 <4.6 6.7 >3  Right Sup  Peroneal Anti Sensory (Ant Lat Mall)  31C  12 cm    2.7 <4.6 6.1 >3  Left Sural Anti Sensory (Lat Mall)  31C  Calf    4.0 <4.6 7.0 >3  Right Sural Anti Sensory (Lat Mall)  Calf    3.8 <4.6 7.5 >3   Motor Summary Table   Site NR Onset (ms) Norm Onset (ms) O-P Amp (mV) Norm O-P Amp Site1 Site2 Delta-0 (ms) Dist (cm) Vel (m/s) Norm Vel (m/s)  Left Peroneal Motor (Ext Dig Brev)  31C  Ankle    4.8 <6.0 2.8 >2.5 B Fib Ankle 9.8 35.0 36 >40  B Fib    14.6  2.1  Poplt B Fib 2.5 10.0 40 >40  Poplt    17.1  2.1         Right Peroneal Motor (Ext Dig Brev)  31C  Ankle    3.1 <6.0 0.6 >2.5 B Fib Ankle 9.6 31.0 32 >40  B Fib    12.7  0.4  Poplt B Fib 3.9 12.0 31 >40  Poplt    16.6  0.4         Left Peroneal TA Motor (Tib Ant)  31C  Fib Head    3.4 <4.5 5.4 >3 Poplit Fib Head 1.8 8.5 47 >40  Poplit    5.2  5.3         Right Peroneal TA Motor (Tib Ant)  31C  Fib Head    3.2 <4.5 5.0 >3 Poplit Fib Head 2.1 9.0 43 >40  Poplit    5.3  4.7         Left Tibial Motor (Abd Hall Brev)  31C  Ankle    4.4 <6.0 3.2 >4 Knee Ankle 11.2 39.0 35 >40  Knee    15.6  2.5         Right Tibial Motor (Abd Hall Brev)  31C  Ankle    5.0 <6.0 6.8 >4 Knee Ankle 11.2 39.0 35 >40  Knee    16.2  5.5          H Reflex Studies   NR H-Lat (ms) Lat Norm (ms) L-R H-Lat (ms)  Left Tibial (Gastroc)  31C  NR  <35   Right Tibial (Gastroc)  31C  NR  <35    EMG   Side Muscle Ins Act Fibs Psw Fasc Number Recrt Dur Dur. Amp Amp. Poly Poly. Comment  Left AntTibialis Nml Nml Nml Nml 1- Mod-R Some 1+ Some 1+ Nml Nml N/A  Left Gastroc Nml Nml Nml Nml 2- Mod-R Some 1+ Some 1+ Nml Nml N/A  Left Flex Dig Long Nml Nml Nml Nml 1- Mod-R Some 1+ Few 1+ Nml Nml N/A  Left RectFemoris Nml Nml Nml Nml 2- Mod-R Some 1+ Some 1+ Nml Nml N/A  Left GluteusMed Nml Nml Nml Nml 1- Mod-R Few 1+ Nml Nml Nml Nml N/A  Left AdductorLong Nml Nml Nml Nml 1- Mod-R Few 1+ Nml Nml Nml Nml N/A  Left BicepsFemS Nml Nml Nml Nml 2- Rapid Some 1+ Some  1+ Nml Nml N/A  Right BicepsFemS Nml Nml Nml Nml 1- Mod-R Few 1+ Nml Nml Nml Nml N/A  Right AntTibialis Nml Nml Nml Nml 2- Mod-R Some 1+ Some 1+ Nml Nml N/A  Right Gastroc Nml Nml Nml Nml 1- Mod-R Some 1+ Nml Nml Nml Nml N/A  Right Flex Dig Long Nml Nml Nml Nml 2- Rapid Some 1+ Some 1+ Nml Nml N/A  Right RectFemoris Nml Nml Nml Nml 1- Rapid Some 1+ Nml Nml Nml Nml N/A      Waveforms:

## 2014-03-18 ENCOUNTER — Telehealth: Payer: Self-pay | Admitting: Neurology

## 2014-03-18 ENCOUNTER — Encounter: Payer: Self-pay | Admitting: Neurology

## 2014-03-18 NOTE — Telephone Encounter (Signed)
Patient made aware of results. They would like information sent to Dr Trenton Gammon so they can follow up about injections. Referral sheet and EMG results faxed to [facility] at [phone] with confirmation received.

## 2014-03-18 NOTE — Telephone Encounter (Signed)
-----   Message from [place], DO sent at 03/17/2014  4:21 PM EST ----- Luvenia Starch, please give pt results.  It is coming from the back.  If having back pain, then injection at worst level may be route to go?  If not, then perhaps just take wait and see approach for now.  Is chronic and not acute.

## 2014-04-15 ENCOUNTER — Ambulatory Visit (INDEPENDENT_AMBULATORY_CARE_PROVIDER_SITE_OTHER): Payer: Medicare PPO | Admitting: Neurology

## 2014-04-15 ENCOUNTER — Encounter: Payer: Self-pay | Admitting: Neurology

## 2014-04-15 VITALS — BP 128/82 | HR 68 | Ht 69.0 in | Wt 184.0 lb

## 2014-04-15 DIAGNOSIS — G20 Parkinson's disease: Secondary | ICD-10-CM

## 2014-04-15 DIAGNOSIS — F330 Major depressive disorder, recurrent, mild: Secondary | ICD-10-CM

## 2014-04-15 DIAGNOSIS — R1314 Dysphagia, pharyngoesophageal phase: Secondary | ICD-10-CM

## 2014-04-15 NOTE — Progress Notes (Signed)
[name] was seen today in the movement disorders clinic for neurologic consultation at the request of Dr. Annette Stable. His PCP is at the [facility] in [place].  The consultation is for the evaluation of Parkinson's disease.  The patient is accompanied by his wife who supplements the history.  The patient previously saw Dr. Jennelle Human at [facility] and I do have several of those records.  Patient was diagnosed with Parkinson's disease about 15 years ago, at the age of 74-61 years old.  He states that the first sx was not swinging of the R arm and some minor balance changes.  Balance has gotten worse with time.   The patient is currently on carbidopa/levodopa 25/100, 3 at 9am, 2 at 1, 3 at 5pm, 2 at 9 pm and 1 carbidopa/levodopa 25/100 CR at bedtime.  He is also on Mirapex 0.5 mg 3 times a day and selegiline 5 mg daily.  Because of dyskinesia, Dr. Nicki Reaper did talk to him about DBS, but according to Dr. Bary Leriche notes, the patient was skeptical.  He did have a neuropsych evaluation at the [facility] in [place], but it appears that this was more of counseling and no testing.  He did have a nocturnal polysomnogram on 06/20/2010 demonstrating an apnea hypopnea index of 16.  07/11/13 update:  Pt is f/u today re: PD.  This patient is accompanied in the office by his spouse who supplements the history.  He is on carbidopa/levodopa, 3 at 9am, 2 at 1, 3 at 5pm, 2 at 9 pm and takes carbidopa/levodopa CR 25/100  at bedtime.  This is a total daily levodopa dose of 1100 mg per day.  He is on mirapex 0.5 mg three times per day and selegeline 5 mg in the AM.  He states, "I am ready for DBS.  Tell me about what I need to do."  Pt c/o dyskinesias.  Falls on average once per week.  No hallucinations.  No n/v.  No syncope.  States that he saw himself singing at [place] and could not believe how much dyskinesia he had.  He states that when his medications don't work, he "hits a wall."  This is sometimes very unpredictable.  07/24/13:  Present with  wife for on/off test.  Actually went to PT late yesterday afternoon and was off of medication and had a very hard time.  While he has been off medication for 36 hours, his wife reports that she thinks that he has done remarkably well.  There has been minimal tremor and he really hasn't been dragging his leg much.  He expresses desire to proceed with DBS.  10/25/13 update:  Patient presents today with his wife, who supplements the history.  The patient is preop DBS.  He is scheduled for deep brain stimulation surgery on 12/06/2013.  He is scheduled for IPG placement on 12/13/2013.  Since our last visit, he did have neuropsychometric testing.  Results of this were favorable for DBS placement.  It was recommended that he faithfully wear his CPAP.  He just had his preop MRI of the brain done.  Patient is currently taking levodopa 25/100, on the following schedule: 3/2/3/2 and then an additional carbidopa/levodopa 25/100 CR at bedtime.  This is in addition to pramipexole 0.5 mg 3 times a day and selegiline 5 mg in the morning.  Overall doing well.  No hallucinations.  No falls.  Has had some more of his chronic back pain.  01/08/14 update:  The patient  presents today for followup.  He is accompanied by his wife who supplements the history.  The patient underwent bilateral STN DBS on 12/06/2013 with generator placement on 12/13/2013.  The patient's wife reports that his Parkinson's disease symptoms have actually been much better since surgery.  He did go back to taking his previous dose of medication, however.  He is on carbidopa/levodopa 25/100 on the following schedule: 3/2/3/2 and then he takes an additional carbidopa/levodopa 25/100 CR at night.  He is also on Mirapex 0.5 mg 3 times a day as well as selegiline 5 mg in the morning.  He had 2 falls since last visit.  On the one, he actually fell down a few stairs and states that he was actually able to roll onto the carpet.  He did not get hurt.  With the other one,  he states that he was walking on a flat surface and just did not expect there to be a step downward and there was.  Again, he did not get hurt.  Wife reports minor dyskinesia.  They have noted no tremor.  Rigidity has been better.  He has been very sleepy.  Wife thinks that this is because he only sleeps about 4 hours at night because he is trying to do so much during the day.  01/16/14 update:  Patient returns today, accompanied by his wife who supplements the history.  Overall, the patient has been doing well.  He remains very sleepy.  He is not sure if that is from medication.  He refuses to wear the CPAP.  He is supposed to be on a tablet of carbidopa/levodopa 25/100 per day but has dropped down to 6 on his own.  He is on pramipexole 0.5 mg twice per day.  He does not want to start speech therapy until November, because he is going out of town.  He had one fall on the stairs since last visit.  Reports that he has an old house with small steps.  He denies any dyskinesia.  He has had some swelling in the hands, right greater than left.  He has an appointment to discuss this with his primary care physician.  He denies any tremor.    01/22/14 update:  Patient returns today for followup, accompanied by his wife who supplements the history.  Patient called me almost immediately after last visit complaining about dyskinesia and he wanted me to turn his settings back down.  I had told him to instead to decrease his levodopa up to 2 tablets in the morning, one in the afternoon and one in the evening and we had already been weaning his Mirapex.  That stopped the dyskinesias which were in the R arm and he admits that some days he is only taking 3 levodopa per day.  He is sometimes having what sounds like myoclonic jerking in the R arm.  He also c/o "aching" in the joints all over the body "but it doesn't feel like the stiffness of parkinsons disease."  He still has swelling.  He did f/u with PCP but etiology is unknown.     02/17/14 update:  This patient is accompanied in the office by his spouse who supplements the history.  Has had several falls.  Almost fell into the fireplace.  [place] backwards going down the stairs.  No serious injuries but still multiple falls.  Generally occur on the stairs.  Wrecked his [place] as missed the brake.  No tremor but did turn off his device  and notes that tremor will immediately return.  Off the mirapex but still sleeping "all the time."  Wife states that no longer doing the things that he is to work to do.  He is less involved with the church, which he used to be very passionate about.  He is not exercising like he was.  He started speech therapy today.  He had a  Modified barium swallow on 02/05/2014 that demonstrated moderate pharyngeal phase dysphagia and recommended a dysphagia 3 diet (mechanical soft) with nectar thick liquids.  They just got the nectar thick-it today.  His wife thinks that perhaps he is depressed.  She thinks that maybe that is why he is sleeping all the time.the patient also asked me about a referral to orthopedics for a rotator cuff problem on the left.  He is having severe pain, but does not necessarily want surgery.  04/15/14 update:  The patient returns today for follow-up, accompanied by his wife who supplements the history.  He remains on carbidopa/levodopa 25/100, 1 tablet 4 times per day.  He was started on Wellbutrin last visit.  This has been worked up to 300 mg daily.  His mood is much better.  His motivation is much better.  He is sleeping much less.  His balance is not very good.  He has not yet started physical therapy, but plans to next week.  He has finished LSVT loud.  He notices some tremor of the left hand and tremor of the right face.  We did have him try to turn off the DBS to see what would happen with the tremor right face and he got much worse.  He has seen Dr. Maureen Ralphs and had cortisone injections into the right shoulder.  That has helped.  He had an  EMG that demonstrated chronic multilevel radiculopathy from L2-S1 bilaterally.  There was no evidence of peripheral neuropathy.    Neuroimaging has  previously been performed.  It is not available for my review today.  PREVIOUS MEDICATIONS: Sinemet, Sinemet CR, Mirapex and Eldpryl  ALLERGIES:  No Known Allergies  CURRENT MEDICATIONS:    Medication List       This list is accurate as of: 04/15/14  3:31 PM.  Always use your most recent med list.               acetaminophen 325 MG tablet  Commonly known as:  TYLENOL  Take 650 mg by mouth every 6 (six) hours as needed.     BLINK TEARS 0.25 % Gel  Generic drug:  Polyethylene Glycol 400  Apply to eye 2 (two) times a week. Both eyes     buPROPion 300 MG 24 hr tablet  Commonly known as:  WELLBUTRIN XL  Take 1 tablet (300 mg total) by mouth daily.     carbidopa-levodopa 25-100 MG per tablet  Commonly known as:  SINEMET IR  Take by mouth. 2 tabs po in the AM and 1 tab in the afternoon and PM     cholecalciferol 1000 UNITS tablet  Commonly known as:  VITAMIN D  Take 1,000 Units by mouth daily.     fluticasone 50 MCG/ACT nasal spray  Commonly known as:  FLONASE  Place 2 sprays into both nostrils daily as needed for allergies.     gabapentin 300 MG capsule  Commonly known as:  NEURONTIN  Take 1 capsule (300 mg total) by mouth 3 (three) times daily.     hydrochlorothiazide 25 MG tablet  Commonly known  as:  HYDRODIURIL  Take 1 tablet (25 mg total) by mouth daily.     multivitamin with minerals Tabs tablet  Take 1 tablet by mouth daily.     pravastatin 20 MG tablet  Commonly known as:  PRAVACHOL  Take 1 tablet (20 mg total) by mouth every evening.     STYE OP  Apply 1 application to eye 2 (two) times daily as needed (for styes).     tetrahydrozoline 0.05 % ophthalmic solution  Place 1 drop into both eyes 2 (two) times daily as needed (for itchy eyes).         PAST MEDICAL HISTORY:   Past Medical History    Diagnosis Date  . Parkinson's disease     dx'ed 15 years ago  . PTSD (post-traumatic stress disorder)   . Bradycardia   . Hypertension     treated with HCTZ  . Sleep apnea     doesn't use C-pap  . Varicose veins   . Cancer 2013    skin cancer  . Arthritis   . History of chicken pox   . GERD (gastroesophageal reflux disease)   . Dysrhythmia     chronic slow heart rate  . Depression     ptsd  . History of kidney stones   . Headache(784.0)     tension headaches non recent    PAST SURGICAL HISTORY:   Past Surgical History  Procedure Laterality Date  . Cyst removed       from lip as a child  . Skin cancer removed      from ears,   11 lft arm  rt leg 14  . Lumbar laminectomy/decompression microdiscectomy Bilateral 12/14/2012    Procedure: Bilateral lumbar three-four, four-five decompressive laminotomy/foraminotomy;  Surgeon: Charlie Pitter, MD;  Location: [facility];  Service: Neurosurgery;  Laterality: Bilateral;  . Subthalamic stimulator insertion Bilateral 12/06/2013    Procedure: SUBTHALAMIC STIMULATOR INSERTION;  Surgeon: Erline Levine, MD;  Location: [facility];  Service: Neurosurgery;  Laterality: Bilateral;  Bilateral deep brain stimulator placement  . Pulse generator implant Bilateral 12/13/2013    Procedure: Bilateral implantable pulse generator placement;  Surgeon: Erline Levine, MD;  Location: [facility];  Service: Neurosurgery;  Laterality: Bilateral;  Bilateral implantable pulse generator placement    SOCIAL HISTORY:   History   Social History  . Marital Status: Married    Spouse Name: N/A    Number of Children: N/A  . Years of Education: N/A   Occupational History  . disabled     2001, [employer]  .      search and rescue helicopter   Social History Main Topics  . Smoking status: Never Smoker   . Smokeless tobacco: Former Systems developer  . Alcohol Use: Yes     Comment: occasional (twice per month)  . Drug Use: No  . Sexual Activity: Not on file   Other Topics  Concern  . 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    Married 2014    FAMILY HISTORY:   Family Status  Relation Status Death Age  . Mother Alive     Arthritis, unknown medical hx  . Father Deceased     Lung Cancer  . Sister Alive     1 and one half sister  . Brother Alive   . Son Alive   .  Daughter Alive     2    ROS:  A complete 10 system review of systems was obtained and was unremarkable apart from what is mentioned above.  PHYSICAL EXAMINATION:    VITALS:   Filed Vitals:   04/15/14 1522  BP: 128/82  Pulse: 68  Height: 5\' 9"  (1.753 m)  Weight: 184 lb (83.462 kg)    GEN:  The patient appears stated age and is in NAD.   HEENT:  Normocephalic, atraumatic.  The mucous membranes are moist. The superficial temporal arteries are without ropiness or tenderness. Skin: The chest wounds and scalp wounds are all well healed, without erythema or drainage.     Neurological examination:  Orientation: The patient is alert and oriented x3.  He is very awake and not somnolent at all. Cranial nerves: There is good facial symmetry.  The speech is fluent but significantly dysarthric.   he is less hypophonic.   Soft palate rises symmetrically and there is no tongue deviation. Hearing is intact to conversational tone. Sensation: Sensation is intact to light touch throughout. Motor: Strength is 5/5 in the bilateral upper and lower extremities.   Shoulder shrug is equal and symmetric.  There is no pronator drift. Deep tendon reflexes: Deferred today.  Movement examination: Tone: mild intermittent tremor of the left hand.  Very rare tremor of the right face noted.   Abnormal movements: none  Coordination: There is no significant decremation with RAM's Gait and Station: The patient has no difficulty arising out of a deep-seated chair without the use of the hands. The patient's stride length  is good.    DBS programming was performed today which is described in more detail on a separate programming procedural notes.   in brief, there was improvement of the right face tremor and left hand tremor upon programming.    ASSESSMENT/PLAN:  1.  Parkinsons disease by hx. He has been seeing Dr. Jennelle Human through the [facility].    -The patient is status post bilateral STN DBS on 12/06/2013 with battery placement on 12/13/2013.  -off of mirapex  -Continue carbidopa/levodopa 25/100, but only on 3-4 tablets per day  -he is off of selegiline.    -he will start physical therapy next week.   2.  LBP.   -he has had lumbar surgery and there is EMG evidence of chronic multilevel radiculopathy from L2-S1 bilaterally. 3.  Dysphagia  -He had a  Modified barium swallow on 02/05/2014 that demonstrated moderate pharyngeal phase dysphagia and recommended a dysphagia 3 diet (mechanical soft) with nectar thick liquids.   4.  OSAS  -We talked about morbidity and mortality associated with untreated sleep apnea.    He was not able to tolerate the cpap despite trying multiple masks. 5.  Depression  -sleepiness and motivation much better on Wellbutrin XL, 300 mg daily.   6.  Left  Shoulder pain  -better after injection.   7.  Will plan on seeing the patient back in the next few months, sooner should new neurologic issues arise.

## 2014-04-15 NOTE — Patient Instructions (Signed)
I want to see you at the 06/27/14 Parkinsons event. It will be at the [facility] center. You can look up the event on the [employer] (its not on there yet but will be in the next few weeks). It will be from 8:30-12:00. You will need to RSVP to the parkinsons association of the [place].

## 2014-04-15 NOTE — Procedures (Signed)
DBS Programming was performed.    Total time spent programming was 30 minutes.  Device was confirmed to be on.  Soft start was confirmed to be on at 8.  Impedences were checked and were within normal limits.  Battery was checked and was determined to be functioning normally and not near the end of life. Final settings were as follows:  Left brain electrode:     1-C+           ; Amplitude  3.4   V   ; Pulse width 60 microseconds;   Frequency   130   Hz.  Right brain electrode:     1-C+          ; Amplitude   3.5  V ;  Pulse width 60  microseconds;  Frequency   140    Hz.

## 2014-04-17 ENCOUNTER — Ambulatory Visit: Payer: Medicare PPO | Admitting: Neurology

## 2014-04-18 ENCOUNTER — Encounter: Payer: Self-pay | Admitting: Neurology

## 2014-04-29 ENCOUNTER — Ambulatory Visit: Payer: Self-pay | Admitting: Neurosurgery

## 2014-05-08 ENCOUNTER — Encounter: Payer: Self-pay | Admitting: Neurology

## 2014-05-08 ENCOUNTER — Ambulatory Visit (INDEPENDENT_AMBULATORY_CARE_PROVIDER_SITE_OTHER): Payer: Medicare PPO | Admitting: Neurology

## 2014-05-08 VITALS — BP 112/70 | HR 54 | Ht 69.0 in | Wt 176.0 lb

## 2014-05-08 DIAGNOSIS — Z9689 Presence of other specified functional implants: Secondary | ICD-10-CM

## 2014-05-08 DIAGNOSIS — G20 Parkinson's disease: Secondary | ICD-10-CM

## 2014-05-08 DIAGNOSIS — Z9889 Other specified postprocedural states: Secondary | ICD-10-CM

## 2014-05-08 NOTE — Progress Notes (Signed)
[name] was seen today in the movement disorders clinic for neurologic consultation at the request of Dr. Annette Mcgee. His PCP is at the [facility] in [place].  The consultation is for the evaluation of Parkinson's disease.  The patient is accompanied by his wife who supplements the history.  The patient previously saw Dr. Jennelle Mcgee at [facility] and I do have several of those records.  Patient was diagnosed with Parkinson's disease about 15 years ago, at the age of 74-60 years old.  He states that the first sx was not swinging of the R arm and some minor balance changes.  Balance has gotten worse with time.   The patient is currently on carbidopa/levodopa 25/100, 3 at 9am, 2 at 1, 3 at 5pm, 2 at 9 pm and 1 carbidopa/levodopa 25/100 CR at bedtime.  He is also on Mirapex 0.5 mg 3 times a day and selegiline 5 mg daily.  Because of dyskinesia, Dr. Nicki Mcgee did talk to him about DBS, but according to Dr. Bary Mcgee notes, the patient was skeptical.  He did have a neuropsych evaluation at the [facility] in [place], but it appears that this was more of counseling and no testing.  He did have a nocturnal polysomnogram on 06/20/2010 demonstrating an apnea hypopnea index of 16.  07/11/13 update:  Pt is f/u today re: PD.  This patient is accompanied in the office by his spouse who supplements the history.  He is on carbidopa/levodopa, 3 at 9am, 2 at 1, 3 at 5pm, 2 at 9 pm and takes carbidopa/levodopa CR 25/100  at bedtime.  This is a total daily levodopa dose of 1100 mg per day.  He is on mirapex 0.5 mg three times per day and selegeline 5 mg in the AM.  He states, "I am ready for DBS.  Tell me about what I need to do."  Pt c/o dyskinesias.  Falls on average once per week.  No hallucinations.  No n/v.  No syncope.  States that he saw himself singing at [place] and could not believe how much dyskinesia he had.  He states that when his medications don't work, he "hits a wall."  This is sometimes very unpredictable.  07/24/13:  Present with  wife for on/off test.  Actually went to PT late yesterday afternoon and was off of medication and had a very hard time.  While he has been off medication for 36 hours, his wife reports that she thinks that he has done remarkably well.  There has been minimal tremor and he really hasn't been dragging his leg much.  He expresses desire to proceed with DBS.  10/25/13 update:  Patient presents today with his wife, who supplements the history.  The patient is preop DBS.  He is scheduled for deep brain stimulation surgery on 12/06/2013.  He is scheduled for IPG placement on 12/13/2013.  Since our last visit, he did have neuropsychometric testing.  Results of this were favorable for DBS placement.  It was recommended that he faithfully wear his CPAP.  He just had his preop MRI of the brain done.  Patient is currently taking levodopa 25/100, on the following schedule: 3/2/3/2 and then an additional carbidopa/levodopa 25/100 CR at bedtime.  This is in addition to pramipexole 0.5 mg 3 times a day and selegiline 5 mg in the morning.  Overall doing well.  No hallucinations.  No falls.  Has had some more of his chronic back pain.  01/08/14 update:  The patient  presents today for followup.  He is accompanied by his wife who supplements the history.  The patient underwent bilateral STN DBS on 12/06/2013 with generator placement on 12/13/2013.  The patient's wife reports that his Parkinson's disease symptoms have actually been much better since surgery.  He did go back to taking his previous dose of medication, however.  He is on carbidopa/levodopa 25/100 on the following schedule: 3/2/3/2 and then he takes an additional carbidopa/levodopa 25/100 CR at night.  He is also on Mirapex 0.5 mg 3 times a day as well as selegiline 5 mg in the morning.  He had 2 falls since last visit.  On the one, he actually fell down a few stairs and states that he was actually able to roll onto the carpet.  He did not get hurt.  With the other one,  he states that he was walking on a flat surface and just did not expect there to be a step downward and there was.  Again, he did not get hurt.  Wife reports minor dyskinesia.  They have noted no tremor.  Rigidity has been better.  He has been very sleepy.  Wife thinks that this is because he only sleeps about 4 hours at night because he is trying to do so much during the day.  01/16/14 update:  Patient returns today, accompanied by his wife who supplements the history.  Overall, the patient has been doing well.  He remains very sleepy.  He is not sure if that is from medication.  He refuses to wear the CPAP.  He is supposed to be on a tablet of carbidopa/levodopa 25/100 per day but has dropped down to 6 on his own.  He is on pramipexole 0.5 mg twice per day.  He does not want to start speech therapy until November, because he is going out of town.  He had one fall on the stairs since last visit.  Reports that he has an old house with small steps.  He denies any dyskinesia.  He has had some swelling in the hands, right greater than left.  He has an appointment to discuss this with his primary care physician.  He denies any tremor.    01/22/14 update:  Patient returns today for followup, accompanied by his wife who supplements the history.  Patient called me almost immediately after last visit complaining about dyskinesia and he wanted me to turn his settings back down.  I had told him to instead to decrease his levodopa up to 2 tablets in the morning, one in the afternoon and one in the evening and we had already been weaning his Mirapex.  That stopped the dyskinesias which were in the R arm and he admits that some days he is only taking 3 levodopa per day.  He is sometimes having what sounds like myoclonic jerking in the R arm.  He also c/o "aching" in the joints all over the body "but it doesn't feel like the stiffness of parkinsons disease."  He still has swelling.  He did f/u with PCP but etiology is unknown.     02/17/14 update:  This patient is accompanied in the office by his spouse who supplements the history.  Has had several falls.  Almost fell into the fireplace.  [place] backwards going down the stairs.  No serious injuries but still multiple falls.  Generally occur on the stairs.  Wrecked his [place] as missed the brake.  No tremor but did turn off his device  and notes that tremor will immediately return.  Off the mirapex but still sleeping "all the time."  Wife states that no longer doing the things that he is to work to do.  He is less involved with the church, which he used to be very passionate about.  He is not exercising like he was.  He started speech therapy today.  He had a  Modified barium swallow on 02/05/2014 that demonstrated moderate pharyngeal phase dysphagia and recommended a dysphagia 3 diet (mechanical soft) with nectar thick liquids.  They just got the nectar thick-it today.  His wife thinks that perhaps he is depressed.  She thinks that maybe that is why he is sleeping all the time.the patient also asked me about a referral to orthopedics for a rotator cuff problem on the left.  He is having severe pain, but does not necessarily want surgery.  04/15/14 update:  The patient returns today for follow-up, accompanied by his wife who supplements the history.  He remains on carbidopa/levodopa 25/100, 1 tablet 4 times per day.  He was started on Wellbutrin last visit.  This has been worked up to 300 mg daily.  His mood is much better.  His motivation is much better.  He is sleeping much less.  His balance is not very good.  He has not yet started physical therapy, but plans to next week.  He has finished LSVT loud.  He notices some tremor of the left hand and tremor of the right face.  We did have him try to turn off the DBS to see what would happen with the tremor right face and he got much worse.  He has seen Dr. Maureen Ralphs and had cortisone injections into the right shoulder.  That has helped.  He had an  EMG that demonstrated chronic multilevel radiculopathy from L2-S1 bilaterally.  There was no evidence of peripheral neuropathy.  05/08/13 update:  This patient is accompanied in the office by his spouse who supplements the history.  Overall, the patient is doing well but he is contemplating back surgery.  He saw Dr. Trenton Gammon regarding this and is supposed to have a CT myelogram soon.  He presents today because he is having some tremor of the right face and arm and left arm tremor, especially when he is stressed.  He takes 2 levodopa in the morning, one in the afternoon and one in the evening.  He really does fairly well with that.  Tremor is worse in the evening.  No falls.  He is doing fairly well with Wellbutrin for his mood.  He has put physical therapy on hold.     Neuroimaging has  previously been performed.  It is not available for my review today.  PREVIOUS MEDICATIONS: Sinemet, Sinemet CR, Mirapex and Eldpryl  ALLERGIES:  No Known Allergies  CURRENT MEDICATIONS:    Medication List       This list is accurate as of: 05/08/14 10:22 AM.  Always use your most recent med list.               acetaminophen 325 MG tablet  Commonly known as:  TYLENOL  Take 650 mg by mouth every 6 (six) hours as needed.     BLINK TEARS 0.25 % Gel  Generic drug:  Polyethylene Glycol 400  Apply to eye 2 (two) times a week. Both eyes     buPROPion 300 MG 24 hr tablet  Commonly known as:  WELLBUTRIN XL  Take 1 tablet (300  mg total) by mouth daily.     carbidopa-levodopa 25-100 MG per tablet  Commonly known as:  SINEMET IR  Take by mouth. 2 tabs po in the AM and 1 tab in the afternoon and PM     cholecalciferol 1000 UNITS tablet  Commonly known as:  VITAMIN D  Take 1,000 Units by mouth daily.     fluticasone 50 MCG/ACT nasal spray  Commonly known as:  FLONASE  Place 2 sprays into both nostrils daily as needed for allergies.     gabapentin 300 MG capsule  Commonly known as:  NEURONTIN  Take 1  capsule (300 mg total) by mouth 3 (three) times daily.     hydrochlorothiazide 25 MG tablet  Commonly known as:  HYDRODIURIL  Take 1 tablet (25 mg total) by mouth daily.     multivitamin with minerals Tabs tablet  Take 1 tablet by mouth daily.     pravastatin 20 MG tablet  Commonly known as:  PRAVACHOL  Take 1 tablet (20 mg total) by mouth every evening.     STYE OP  Apply 1 application to eye 2 (two) times daily as needed (for styes).     tetrahydrozoline 0.05 % ophthalmic solution  Place 1 drop into both eyes 2 (two) times daily as needed (for itchy eyes).         PAST MEDICAL HISTORY:   Past Medical History  Diagnosis Date  . Parkinson's disease     dx'ed 15 years ago  . PTSD (post-traumatic stress disorder)   . Bradycardia   . Hypertension     treated with HCTZ  . Sleep apnea     doesn't use C-pap  . Varicose veins   . Cancer 2013    skin cancer  . Arthritis   . History of chicken pox   . GERD (gastroesophageal reflux disease)   . Dysrhythmia     chronic slow heart rate  . Depression     ptsd  . History of kidney stones   . Headache(784.0)     tension headaches non recent    PAST SURGICAL HISTORY:   Past Surgical History  Procedure Laterality Date  . Cyst removed       from lip as a child  . Skin cancer removed      from ears,   11 lft arm  rt leg 14  . Lumbar laminectomy/decompression microdiscectomy Bilateral 12/14/2012    Procedure: Bilateral lumbar three-four, four-five decompressive laminotomy/foraminotomy;  Surgeon: Charlie Pitter, MD;  Location: [facility];  Service: Neurosurgery;  Laterality: Bilateral;  . Subthalamic stimulator insertion Bilateral 12/06/2013    Procedure: SUBTHALAMIC STIMULATOR INSERTION;  Surgeon: Erline Levine, MD;  Location: [facility];  Service: Neurosurgery;  Laterality: Bilateral;  Bilateral deep brain stimulator placement  . Pulse generator implant Bilateral 12/13/2013    Procedure: Bilateral implantable pulse generator  placement;  Surgeon: Erline Levine, MD;  Location: [facility];  Service: Neurosurgery;  Laterality: Bilateral;  Bilateral implantable pulse generator placement    SOCIAL HISTORY:   History   Social History  . Marital Status: Married    Spouse Name: N/A    Number of Children: N/A  . Years of Education: N/A   Occupational History  . disabled     2001, [employer]  .      search and rescue helicopter   Social History Main Topics  . Smoking status: Never Smoker   . Smokeless tobacco: Former Systems developer  . Alcohol  Use: Yes     Comment: occasional (twice per month)  . Drug Use: No  . Sexual Activity: Not on file   Other Topics Concern  . 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    Married 2014    FAMILY HISTORY:   Family Status  Relation Status Death Age  . Mother Alive     Arthritis, unknown medical hx  . Father Deceased     Lung Cancer  . Sister Alive     1 and one half sister  . Brother Alive   . Son Alive   . Daughter Alive     2    ROS:  A complete 10 system review of systems was obtained and was unremarkable apart from what is mentioned above.  PHYSICAL EXAMINATION:    VITALS:   Filed Vitals:   05/08/14 1003  BP: 112/70  Pulse: 54  Height: 5\' 9"  (1.753 m)  Weight: 176 lb (79.833 kg)    GEN:  The patient appears stated age and is in NAD.   HEENT:  Normocephalic, atraumatic.  The mucous membranes are moist. The superficial temporal arteries are without ropiness or tenderness. Skin: The chest wounds and scalp wounds are all well healed, without erythema or drainage.     Neurological examination:  Orientation: The patient is alert and oriented x3.  He is very awake and not somnolent at all. Cranial nerves: There is good facial symmetry.  The speech is fluent but significantly dysarthric.   he is less hypophonic.   Soft palate rises symmetrically and there is no tongue  deviation. Hearing is intact to conversational tone. Sensation: Sensation is intact to light touch throughout. Motor: Strength is 5/5 in the bilateral upper and lower extremities.   Shoulder shrug is equal and symmetric.  There is no pronator drift. Deep tendon reflexes: Deferred today.  Movement examination: Tone: No rigidity Abnormal movements: Rare tremor of the right foot. Coordination: There is minor decremation with rapid alternating movements on the left. Gait and Station: The patient has no difficulty arising out of a deep-seated chair without the use of the hands. The patient's stride length is good.    DBS programming was performed today which is described in more detail on a separate programming procedural notes.     ASSESSMENT/PLAN:  1.  Parkinsons disease by hx. He has been seeing Dr. Jennelle Mcgee through the [facility].    -The patient is status post bilateral STN DBS on 12/06/2013 with battery placement on 12/13/2013.  -off of mirapex  -Continue carbidopa/levodopa 25/100, 2 in the AM, 1 in the afternoon and 1 in the evening  -he is off of selegiline.    -encouraged him not to put off PT 2.  LBP.   -he has had lumbar surgery and there is EMG evidence of chronic multilevel radiculopathy from L2-S1 bilaterally.  He is seeing Dr. Trenton Gammon and contemplating further sx 3.  Dysphagia  -He had a  Modified barium swallow on 02/05/2014 that demonstrated moderate pharyngeal phase dysphagia and recommended a dysphagia 3 diet (mechanical soft) with nectar thick liquids.   4.  OSAS  -We talked about morbidity and mortality associated with untreated sleep apnea.    He was not able to tolerate the cpap despite trying multiple masks. 5.  Depression  -sleepiness and motivation much better on Wellbutrin  XL, 300 mg daily.   6.  Left  Shoulder pain  -better after injection.   7.  Will plan on seeing the patient back in the next few months, sooner should new neurologic issues  arise.

## 2014-05-08 NOTE — Procedures (Signed)
DBS Programming was performed.    Total time spent programming was 30 minutes.  Device was confirmed to be on.  Soft start was confirmed to be on at 8.  Impedences were checked and were within normal limits.  Battery was checked and was determined to be functioning normally and not near the end of life. Final settings were as follows:  Left brain electrode:     1-C+           ; Amplitude  3.6   V   ; Pulse width 90 microseconds;   Frequency   130   Hz.  Right brain electrode:     1-C+          ; Amplitude   3.6  V ;  Pulse width 60  microseconds;  Frequency   140    Hz.  DBS was also turned off independently on each side.  Was very significant tremor on the right when the L brain was turned off.  Was slight tremor on the L when the right brain was turned off.

## 2014-05-15 ENCOUNTER — Other Ambulatory Visit: Payer: Self-pay | Admitting: Neurosurgery

## 2014-05-15 DIAGNOSIS — M4317 Spondylolisthesis, lumbosacral region: Secondary | ICD-10-CM

## 2014-05-19 ENCOUNTER — Ambulatory Visit
Admission: RE | Admit: 2014-05-19 | Discharge: 2014-05-19 | Disposition: A | Discharge status: Home / Self Care | Payer: Medicare PPO | Source: Ambulatory Visit | Attending: Neurosurgery | Admitting: Neurosurgery

## 2014-05-19 DIAGNOSIS — M48062 Spinal stenosis, lumbar region with neurogenic claudication: Secondary | ICD-10-CM

## 2014-05-19 DIAGNOSIS — M4317 Spondylolisthesis, lumbosacral region: Secondary | ICD-10-CM

## 2014-05-19 MED ORDER — DIAZEPAM 5 MG PO TABS
10.0000 mg | ORAL_TABLET | Freq: Once | ORAL | Status: AC
  Administered 2014-05-19: 10 mg via ORAL

## 2014-05-19 MED ORDER — IOHEXOL 180 MG/ML  SOLN
15.0000 mL | Freq: Once | INTRAMUSCULAR | Status: AC | PRN
  Administered 2014-05-19: 15 mL via INTRATHECAL

## 2014-05-19 NOTE — Progress Notes (Signed)
Wife states he has been off Wellbutrin since last Friday. Discharge instructions explained to pt and wife.

## 2014-05-19 NOTE — Discharge Instructions (Signed)
Myelogram Discharge Instructions  1. Go home and rest quietly for the next 24 hours.  It is important to lie flat for the next 24 hours.  Get up only to go to the restroom.  You may lie in the bed or on a couch on your back, your stomach, your left side or your right side.  You may have one pillow under your head.  You may have pillows between your knees while you are on your side or under your knees while you are on your back.  2. DO NOT drive today.  Recline the seat as far back as it will go, while still wearing your seat belt, on the way home.  3. You may get up to go to the bathroom as needed.  You may sit up for 10 minutes to eat.  You may resume your normal diet and medications unless otherwise indicated.  Drink lots of extra fluids today and tomorrow.  4. The incidence of headache, nausea, or vomiting is about 5% (one in 20 patients).  If you develop a headache, lie flat and drink plenty of fluids until the headache goes away.  Caffeinated beverages may be helpful.  If you develop severe nausea and vomiting or a headache that does not go away with flat bed rest, call [phone].  5. You may resume normal activities after your 24 hours of bed rest is over; however, do not exert yourself strongly or do any heavy lifting tomorrow. If when you get up you have a headache when standing, go back to bed and force fluids for another 24 hours.  6. Call your physician for a follow-up appointment.  The results of your myelogram will be sent directly to your physician by the following day.  7. If you have any questions or if complications develop after you arrive home, please call [phone].  Discharge instructions have been explained to the patient.  The patient, or the person responsible for the patient, fully understands these instructions.      May resume Wellbutrin on Feb. 2, 2016, after 1:00 pm.

## 2014-05-28 ENCOUNTER — Ambulatory Visit (INDEPENDENT_AMBULATORY_CARE_PROVIDER_SITE_OTHER): Payer: Medicare PPO | Admitting: Cardiovascular Disease

## 2014-05-28 ENCOUNTER — Encounter: Payer: Self-pay | Admitting: Cardiovascular Disease

## 2014-05-28 ENCOUNTER — Other Ambulatory Visit: Payer: Self-pay | Admitting: Neurosurgery

## 2014-05-28 VITALS — BP 110/72 | HR 58 | Ht 69.0 in | Wt 186.5 lb

## 2014-05-28 DIAGNOSIS — R001 Bradycardia, unspecified: Secondary | ICD-10-CM

## 2014-05-28 DIAGNOSIS — E785 Hyperlipidemia, unspecified: Secondary | ICD-10-CM

## 2014-05-28 DIAGNOSIS — I10 Essential (primary) hypertension: Secondary | ICD-10-CM

## 2014-05-28 DIAGNOSIS — G20 Parkinson's disease: Secondary | ICD-10-CM

## 2014-05-28 DIAGNOSIS — R0602 Shortness of breath: Secondary | ICD-10-CM

## 2014-05-28 DIAGNOSIS — Z01810 Encounter for preprocedural cardiovascular examination: Secondary | ICD-10-CM

## 2014-05-28 NOTE — Progress Notes (Signed)
Patient ID: [name], male    DOB: 01-29-1951, 64 y.o.   MRN: [id number]  HPI Comments: [name] is a pleasant 64 -year-old male year-old gentleman with Parkinson's for more than 10 years, hypertension, chronic bradycardia who presents for routine follow-up of his blood pressure and bradycardia.  Recent placement of deep brain stimulator August 2015 for tremor He receives most of his primary care followup at the [facility] Notes from there indicate history of hyperlipidemia, depression, Parkinson's, possible seizure disorder  He has a history of chronic low back pain, lumbar laminectomy 12/14/2012 by Dr. Annette Stable in [place], rarely takes pain medication  In follow-up today, he reports that he is doing well with his deep brain stimulator. He does do exercise during the week. Parkinson's medicines were significantly decreased after brain stimulator was placed. Wife thinks that this may have caused a change in his mood. With medication changes, previous bradycardia no longer an issue, heart rate typically 60-70 bpm. He denies any lightheadedness or dizziness. He is scheduled for back surgery, fusion with Dr. Annette Stable.  Swelling in his lower extremities has resolved  EKG on today's visit shows normal sinus rhythm with rate 58 bpm, poor R-wave progression to the anterior precordial leads, significant baseline artifact  CT scan of the head 05/29/2013 showing mild chronic inflammatory disease of the sinuses. This was done for right sided headache. Work done 05/29/2013 includes basic    No Known Allergies  Outpatient Encounter Prescriptions as of 05/28/2014  Medication Sig  . acetaminophen (TYLENOL) 325 MG tablet Take 650 mg by mouth every 6 (six) hours as needed.  Marland Kitchen buPROPion (WELLBUTRIN XL) 300 MG 24 hr tablet Take 1 tablet (300 mg total) by mouth daily.  . carbidopa-levodopa (SINEMET IR) 25-100 MG per tablet Take by mouth. 2 tabs po in the AM and 1 tab in the afternoon and PM  .  cholecalciferol (VITAMIN D) 1000 UNITS tablet Take 1,000 Units by mouth daily.  . fluticasone (FLONASE) 50 MCG/ACT nasal spray Place 2 sprays into both nostrils daily as needed for allergies.   Marland Kitchen gabapentin (NEURONTIN) 300 MG capsule Take 1 capsule (300 mg total) by mouth 3 (three) times daily.  . hydrochlorothiazide (HYDRODIURIL) 25 MG tablet Take 1 tablet (25 mg total) by mouth daily.  . Polyethylene Glycol 400 (BLINK TEARS) 0.25 % GEL Apply to eye as needed. Both eyes  . pravastatin (PRAVACHOL) 20 MG tablet Take 1 tablet (20 mg total) by mouth every evening.  Marland Kitchen tetrahydrozoline 0.05 % ophthalmic solution Place 1 drop into both eyes 2 (two) times daily as needed (for itchy eyes).  Dema Severin Petrolatum-Mineral Oil (STYE OP) Apply 1 application to eye 2 (two) times daily as needed (for styes).  . [DISCONTINUED] Multiple Vitamin (MULTIVITAMIN WITH MINERALS) TABS tablet Take 1 tablet by mouth daily.    Past Medical History  Diagnosis Date  . Parkinson's disease     dx'ed 15 years ago  . PTSD (post-traumatic stress disorder)   . Bradycardia   . Hypertension     treated with HCTZ  . Sleep apnea     doesn't use C-pap  . Varicose veins   . Cancer 2013    skin cancer  . Arthritis   . History of chicken pox   . GERD (gastroesophageal reflux disease)   . Dysrhythmia     chronic slow heart rate  . Depression     ptsd  . History of kidney stones   . Headache(784.0)  tension headaches non recent    Past Surgical History  Procedure Laterality Date  . Cyst removed       from lip as a child  . Skin cancer removed      from ears,   12 lft arm  rt leg 15  . Lumbar laminectomy/decompression microdiscectomy Bilateral 12/14/2012    Procedure: Bilateral lumbar three-four, four-five decompressive laminotomy/foraminotomy;  Surgeon: Charlie Pitter, MD;  Location: [place] [facility];  Service: Neurosurgery;  Laterality: Bilateral;  . Subthalamic stimulator insertion Bilateral 12/06/2013    Procedure:  SUBTHALAMIC STIMULATOR INSERTION;  Surgeon: Erline Levine, MD;  Location: [place] [facility];  Service: Neurosurgery;  Laterality: Bilateral;  Bilateral deep brain stimulator placement  . Pulse generator implant Bilateral 12/13/2013    Procedure: Bilateral implantable pulse generator placement;  Surgeon: Erline Levine, MD;  Location: [place] [facility];  Service: Neurosurgery;  Laterality: Bilateral;  Bilateral implantable pulse generator placement    Social History  reports that he has never smoked. He has quit using smokeless tobacco. He reports that he drinks alcohol. He reports that he does not use illicit drugs.  Family History family history is negative for Colon cancer and Prostate cancer.   Review of Systems  Constitutional: Negative.        Right leg swelling  Respiratory: Negative.   Cardiovascular: Negative.   Gastrointestinal: Negative.   Musculoskeletal: Negative.   Neurological: Negative.   Hematological: Negative.   Psychiatric/Behavioral: Negative.   All other systems reviewed and are negative.   BP 110/72 mmHg  Pulse 58  Ht 5\' 9"  (1.753 m)  Wt 186 lb 8 oz (84.596 kg)  BMI 27.53 kg/m2  Physical Exam  Constitutional: He is oriented to person, place, and time. He appears well-developed and well-nourished.  HENT:  Head: Normocephalic.  Nose: Nose normal.  Mouth/Throat: Oropharynx is clear and moist.  Eyes: Conjunctivae are normal. Pupils are equal, round, and reactive to light.  Neck: Normal range of motion. Neck supple. No JVD present.  Cardiovascular: Normal rate, regular rhythm, S1 normal, S2 normal, normal heart sounds and intact distal pulses.  Exam reveals no gallop and no friction rub.   No murmur heard. Pulmonary/Chest: Effort normal and breath sounds normal. No respiratory distress. He has no wheezes. He has no rales. He exhibits no tenderness.  Abdominal: Soft. Bowel sounds are normal. He exhibits no distension. There is no tenderness.  Musculoskeletal: Normal range  of motion. He exhibits no edema or tenderness.  Lymphadenopathy:    He has no cervical adenopathy.  Neurological: He is alert and oriented to person, place, and time. Coordination normal.  Skin: Skin is warm and dry. No rash noted. No erythema.  Psychiatric: He has a normal mood and affect. His behavior is normal. Judgment and thought content normal.      Assessment and Plan   Nursing note and vitals reviewed.

## 2014-05-28 NOTE — Assessment & Plan Note (Signed)
Deep brain stimulator August 2015, Parkinson's medication doses decreased, seems to be doing well, stable. Recommended a walking program daily once his back surgery is complete and he has recovered

## 2014-05-28 NOTE — Patient Instructions (Signed)
You are doing well. No medication changes were made.  Please call us if you have new issues that need to be addressed before your next appt.  Your physician wants you to follow-up in: 12 months.  You will receive a reminder letter in the mail two months in advance. If you don't receive a letter, please call our office to schedule the follow-up appointment. 

## 2014-05-28 NOTE — Assessment & Plan Note (Signed)
Blood pressure is well controlled on today's visit. No changes made to the medications. 

## 2014-05-28 NOTE — Assessment & Plan Note (Signed)
He would be acceptable risk for upcoming back surgery. No further testing needed. No symptoms concerning for angina. Blood pressure well controlled. Bradycardia much improved. I would not be surprised if he does have bradycardia during the procedure in the setting of anesthesia. This would likely be of little concern if blood pressure stable.

## 2014-05-28 NOTE — Assessment & Plan Note (Signed)
Cholesterol is at goal on the current lipid regimen. No changes to the medications were made.  

## 2014-05-28 NOTE — Assessment & Plan Note (Signed)
Heart rate around 60. Wife reports bradycardia has not been a significant issue recently. Unclear if there is a relationship with his Parkinson's medication which was decreased

## 2014-06-03 ENCOUNTER — Encounter (HOSPITAL_COMMUNITY): Payer: Self-pay | Admitting: Pharmacy Technician

## 2014-06-06 ENCOUNTER — Encounter (HOSPITAL_COMMUNITY)
Admission: RE | Admit: 2014-06-06 | Discharge: 2014-06-06 | Disposition: A | Discharge status: Home / Self Care | Payer: Medicare PPO | Source: Ambulatory Visit | Attending: Neurosurgery | Admitting: Neurosurgery

## 2014-06-06 ENCOUNTER — Encounter (HOSPITAL_COMMUNITY): Payer: Self-pay

## 2014-06-06 DIAGNOSIS — Z01818 Encounter for other preprocedural examination: Secondary | ICD-10-CM | POA: Insufficient documentation

## 2014-06-06 DIAGNOSIS — M4310 Spondylolisthesis, site unspecified: Secondary | ICD-10-CM | POA: Insufficient documentation

## 2014-06-06 HISTORY — DX: Reserved for inherently not codable concepts without codable children: IMO0001

## 2014-06-06 LAB — SURGICAL PCR SCREEN
MRSA, PCR: NEGATIVE
Staphylococcus aureus: NEGATIVE

## 2014-06-06 LAB — CBC WITH DIFFERENTIAL/PLATELET
BASOS ABS: 0 10*3/uL (ref 0.0–0.1)
Basophils Relative: 0 % (ref 0–1)
Eosinophils Absolute: 0.1 10*3/uL (ref 0.0–0.7)
Eosinophils Relative: 1 % (ref 0–5)
HCT: 44.9 % (ref 39.0–52.0)
HEMOGLOBIN: 15.1 g/dL (ref 13.0–17.0)
LYMPHS PCT: 27 % (ref 12–46)
Lymphs Abs: 2.2 10*3/uL (ref 0.7–4.0)
MCH: 30.8 pg (ref 26.0–34.0)
MCHC: 33.6 g/dL (ref 30.0–36.0)
MCV: 91.4 fL (ref 78.0–100.0)
Monocytes Absolute: 0.5 10*3/uL (ref 0.1–1.0)
Monocytes Relative: 7 % (ref 3–12)
NEUTROS PCT: 65 % (ref 43–77)
Neutro Abs: 5.2 10*3/uL (ref 1.7–7.7)
PLATELETS: 193 10*3/uL (ref 150–400)
RBC: 4.91 MIL/uL (ref 4.22–5.81)
RDW: 13.9 % (ref 11.5–15.5)
WBC: 8.1 10*3/uL (ref 4.0–10.5)

## 2014-06-06 LAB — BASIC METABOLIC PANEL
Anion gap: 9 (ref 5–15)
BUN: 17 mg/dL (ref 6–23)
CO2: 26 mmol/L (ref 19–32)
CREATININE: 0.98 mg/dL (ref 0.50–1.35)
Calcium: 9.4 mg/dL (ref 8.4–10.5)
Chloride: 106 mmol/L (ref 96–112)
GFR calc Af Amer: >90 mL/min (ref >90)
GFR calc non Af Amer: 84 mL/min — ABNORMAL LOW (ref >90)
Glucose, Bld: 105 mg/dL — ABNORMAL HIGH (ref 70–99)
Potassium: 4.2 mmol/L (ref 3.5–5.1)
Sodium: 141 mmol/L (ref 135–145)

## 2014-06-06 LAB — TYPE AND SCREEN
ABO/RH(D): A NEG
Antibody Screen: NEGATIVE

## 2014-06-06 LAB — ABO/RH: ABO/RH(D): A NEG

## 2014-06-06 NOTE — Pre-Procedure Instructions (Signed)
[name]  06/06/2014   Your procedure is scheduled on:  06-13-2014  Friday    Report to [facility] Admitting at 5:30  AM.   Call this number if you have problems the morning of surgery: [phone]   Remember:   Do not eat food or drink liquids after midnight.    Take these medicines the morning of surgery with A SIP OF WATER: Bupropion(Wellbutrin),carbidopa-levodopa,gabapentin(neurotin),pain medication as needed,eye drop and ointments as needed   Do not wear jewelry,.  Do not wear lotions, powders, or perfumes. You may not wear deodorant.  Do not shave 48 hours prior to surgery. Men may shave face and neck.   Do not bring valuables to the hospital.  [facility] is not responsible  for any belongings or valuables.               Contacts, dentures or bridgework may not be worn into surgery.   Leave suitcase in the car. After surgery it may be brought to your room.  For patients admitted to the hospital, discharge time is determined by your  treatment team.               Patients discharged the day of surgery will not be allowed to drive home.      Special Instructions: See attached sheet for instructions on CHG shower/bath     Please read over the following fact sheets that you were given: Pain Booklet, Coughing and Deep Breathing, Blood Transfusion Information and Surgical Site Infection Prevention

## 2014-06-06 NOTE — Progress Notes (Signed)
Requested Sleep Study from [place] in [place].

## 2014-06-09 NOTE — Progress Notes (Signed)
Anesthesia Chart Review:  Pt is 64 year old male scheduled for L4-5 PLIF, L1-2 DLL on 06/13/2014 with Dr. Annette Stable.   PMH includes: Parkinson's disease, PTSD (veteran), depression, bradycardia, HTN, OSA, GERD. S/p B implantable pulse generator placement 12/13/2013, s/p subthalamic stimulator insertion 12/06/2013, s/p L3-5 laminotomy/foraminotomy 12/14/12.   Neurologist is Dr. Wells Guiles Tat. PCP is Dr. Elsie Stain. He saw cardiologist Dr. Rockey Situ for bradycardia in 05/28/2014. Dr. Rockey Situ feels patient was relatively asymptomatic of his bradycardia.  Medications include: carbidopa/levadopa. Pt had previously been taking selegiline but this has been discontinued.   Preoperative labs reviewed.    EKG 05/28/2014: atrial bradycardia (58 bpm), occasional ectopic ventricular beat. Low voltage in precordial leads. L atrial enlargement. Anteroseptal infarct, age undetermined. Old inferior infarct. ST depression. Precordial T wave abnormality. Interpreted by Dr. Rockey Situ as sinus rhythm, poor R wave progression to the anterior precordial leads, significant baseline artifact.   Pt has cardiac clearance from Dr. Rockey Situ in [place] note dated 05/28/2014.   If no changes, I anticipate pt can proceed with surgery as scheduled.   Willeen Cass, FNP-BC [facility] Short Stay [facility]/Anesthesiology Phone: [phone] 06/09/2014 3:35 PM

## 2014-06-12 MED ORDER — DEXAMETHASONE SODIUM PHOSPHATE 10 MG/ML IJ SOLN
10.0000 mg | INTRAMUSCULAR | Status: AC
  Administered 2014-06-13: 10 mg via INTRAVENOUS
  Filled 2014-06-12: qty 1

## 2014-06-12 MED ORDER — CEFAZOLIN SODIUM-DEXTROSE 2-3 GM-% IV SOLR
2.0000 g | INTRAVENOUS | Status: AC
  Administered 2014-06-13: 2 g via INTRAVENOUS
  Filled 2014-06-12: qty 50

## 2014-06-12 NOTE — Anesthesia Preprocedure Evaluation (Addendum)
Anesthesia Evaluation  Patient identified by MRN, date of birth, ID band Patient awake    Reviewed: Allergy & Precautions, NPO status , Patient's Chart, lab work & pertinent test results  History of Anesthesia Complications Negative for: history of anesthetic complications  Airway Mallampati: II  TM Distance: >3 FB Neck ROM: Full    Dental no notable dental hx. (+) Dental Advisory Given   Pulmonary shortness of breath and with exertion, sleep apnea and Continuous Positive Airway Pressure Ventilation ,  breath sounds clear to auscultation  Pulmonary exam normal       Cardiovascular hypertension, Pt. on medications + dysrhythmias (chronic bradycardia) Rhythm:Regular Rate:Normal     Neuro/Psych  Headaches, PSYCHIATRIC DISORDERS Anxiety Depression parkinsons s/p dbs, instructed to turn off DBS prior to induction and turn back on after emergence. Medication regimen is carbidopa/levodopa 25/100, 2 in the AM, 1 in the afternoon and 1 in the evening. Patient took am dose and per wife, next dose at around 3pm   Neuromuscular disease    GI/Hepatic Neg liver ROS, GERD-  Medicated and Controlled,  Endo/Other  negative endocrine ROS  Renal/GU negative Renal ROS  negative genitourinary   Musculoskeletal  (+) Arthritis -, Osteoarthritis,    Abdominal   Peds negative pediatric ROS (+)  Hematology negative hematology ROS (+)   Anesthesia Other Findings H/o Parkinsons with tremor on left, much improved after stimulator placement per patient.   Reproductive/Obstetrics negative OB ROS                           Anesthesia Physical Anesthesia Plan  ASA: III  Anesthesia Plan: General   Post-op Pain Management:    Induction: Intravenous  Airway Management Planned: Oral ETT  Additional Equipment:   Intra-op Plan:   Post-operative Plan: Extubation in OR  Informed Consent: I have reviewed the patients  History and Physical, chart, labs and discussed the procedure including the risks, benefits and alternatives for the proposed anesthesia with the patient or authorized representative who has indicated his/her understanding and acceptance.   Dental advisory given  Plan Discussed with: CRNA  Anesthesia Plan Comments:         Anesthesia Quick Evaluation

## 2014-06-13 ENCOUNTER — Inpatient Hospital Stay (HOSPITAL_COMMUNITY): Payer: Medicare PPO | Admitting: Anesthesiology

## 2014-06-13 ENCOUNTER — Encounter (HOSPITAL_COMMUNITY): Payer: Self-pay | Admitting: *Deleted

## 2014-06-13 ENCOUNTER — Inpatient Hospital Stay (HOSPITAL_COMMUNITY): Payer: Medicare PPO | Admitting: Emergency Medicine

## 2014-06-13 ENCOUNTER — Inpatient Hospital Stay (HOSPITAL_COMMUNITY): Payer: Medicare PPO

## 2014-06-13 ENCOUNTER — Inpatient Hospital Stay (HOSPITAL_COMMUNITY)
Admission: RE | Admit: 2014-06-13 | Discharge: 2014-06-17 | DRG: 460 | Disposition: A | Discharge status: Skilled Nursing Facility | Payer: Medicare PPO | Source: Ambulatory Visit | Attending: Neurosurgery | Admitting: Neurosurgery

## 2014-06-13 ENCOUNTER — Encounter (HOSPITAL_COMMUNITY)
Admission: RE | Disposition: A | Discharge status: Skilled Nursing Facility | Payer: Medicare PPO | Source: Ambulatory Visit | Attending: Neurosurgery

## 2014-06-13 DIAGNOSIS — G20 Parkinson's disease: Secondary | ICD-10-CM | POA: Diagnosis present

## 2014-06-13 DIAGNOSIS — M48062 Spinal stenosis, lumbar region with neurogenic claudication: Secondary | ICD-10-CM | POA: Diagnosis present

## 2014-06-13 DIAGNOSIS — Z85828 Personal history of other malignant neoplasm of skin: Secondary | ICD-10-CM | POA: Diagnosis not present

## 2014-06-13 DIAGNOSIS — F4310 Post-traumatic stress disorder, unspecified: Secondary | ICD-10-CM | POA: Diagnosis present

## 2014-06-13 DIAGNOSIS — Z79899 Other long term (current) drug therapy: Secondary | ICD-10-CM

## 2014-06-13 DIAGNOSIS — M549 Dorsalgia, unspecified: Secondary | ICD-10-CM | POA: Diagnosis present

## 2014-06-13 DIAGNOSIS — Z419 Encounter for procedure for purposes other than remedying health state, unspecified: Secondary | ICD-10-CM

## 2014-06-13 DIAGNOSIS — M4806 Spinal stenosis, lumbar region: Secondary | ICD-10-CM | POA: Diagnosis present

## 2014-06-13 DIAGNOSIS — I10 Essential (primary) hypertension: Secondary | ICD-10-CM | POA: Diagnosis present

## 2014-06-13 DIAGNOSIS — M4310 Spondylolisthesis, site unspecified: Secondary | ICD-10-CM | POA: Diagnosis present

## 2014-06-13 DIAGNOSIS — K219 Gastro-esophageal reflux disease without esophagitis: Secondary | ICD-10-CM | POA: Diagnosis present

## 2014-06-13 DIAGNOSIS — M4306 Spondylolysis, lumbar region: Principal | ICD-10-CM | POA: Diagnosis present

## 2014-06-13 SURGERY — POSTERIOR LUMBAR FUSION 1 LEVEL
Anesthesia: General | Site: Back

## 2014-06-13 MED ORDER — OXYCODONE-ACETAMINOPHEN 5-325 MG PO TABS
1.0000 | ORAL_TABLET | ORAL | Status: DC | PRN
  Administered 2014-06-13 – 2014-06-16 (×9): 2 via ORAL
  Filled 2014-06-13 (×10): qty 2

## 2014-06-13 MED ORDER — CEFAZOLIN SODIUM 1-5 GM-% IV SOLN
1.0000 g | Freq: Three times a day (TID) | INTRAVENOUS | Status: AC
  Administered 2014-06-13 – 2014-06-14 (×2): 1 g via INTRAVENOUS
  Filled 2014-06-13 (×2): qty 50

## 2014-06-13 MED ORDER — VANCOMYCIN HCL 1000 MG IV SOLR
INTRAVENOUS | Status: AC
  Filled 2014-06-13: qty 1000

## 2014-06-13 MED ORDER — NEOSTIGMINE METHYLSULFATE 10 MG/10ML IV SOLN
INTRAVENOUS | Status: DC | PRN
  Administered 2014-06-13: 4 mg via INTRAVENOUS

## 2014-06-13 MED ORDER — PHENOL 1.4 % MT LIQD: 1.0000 | OROMUCOSAL | Status: DC | PRN

## 2014-06-13 MED ORDER — MIDAZOLAM HCL 2 MG/2ML IJ SOLN
INTRAMUSCULAR | Status: AC
  Filled 2014-06-13: qty 2

## 2014-06-13 MED ORDER — POLYETHYLENE GLYCOL 3350 17 G PO PACK
17.0000 g | PACK | Freq: Every day | ORAL | Status: DC | PRN
  Administered 2014-06-14: 17 g via ORAL
  Filled 2014-06-13: qty 1

## 2014-06-13 MED ORDER — ROCURONIUM BROMIDE 100 MG/10ML IV SOLN
INTRAVENOUS | Status: DC | PRN
  Administered 2014-06-13: 10 mg via INTRAVENOUS
  Administered 2014-06-13: 20 mg via INTRAVENOUS
  Administered 2014-06-13 (×2): 10 mg via INTRAVENOUS
  Administered 2014-06-13: 40 mg via INTRAVENOUS
  Administered 2014-06-13: 10 mg via INTRAVENOUS

## 2014-06-13 MED ORDER — HYDROMORPHONE HCL 1 MG/ML IJ SOLN: 0.5000 mg | INTRAMUSCULAR | Status: DC | PRN

## 2014-06-13 MED ORDER — ENSURE COMPLETE PO LIQD
237.0000 mL | Freq: Two times a day (BID) | ORAL | Status: DC
  Administered 2014-06-13 – 2014-06-17 (×8): 237 mL via ORAL

## 2014-06-13 MED ORDER — POLYETHYLENE GLYCOL 400 0.25 % OP GEL: 1.0000 [drp] | OPHTHALMIC | Status: DC | PRN

## 2014-06-13 MED ORDER — GLYCOPYRROLATE 0.2 MG/ML IJ SOLN
INTRAMUSCULAR | Status: DC | PRN
  Administered 2014-06-13: 0.6 mg via INTRAVENOUS
  Administered 2014-06-13 (×3): 0.2 mg via INTRAVENOUS

## 2014-06-13 MED ORDER — CARBIDOPA-LEVODOPA 25-100 MG PO TABS
1.0000 | ORAL_TABLET | ORAL | Status: DC
  Administered 2014-06-13 – 2014-06-16 (×7): 1 via ORAL
  Filled 2014-06-13 (×7): qty 1

## 2014-06-13 MED ORDER — GLYCOPYRROLATE 0.2 MG/ML IJ SOLN
INTRAMUSCULAR | Status: AC
  Filled 2014-06-13: qty 3

## 2014-06-13 MED ORDER — HYDROCHLOROTHIAZIDE 25 MG PO TABS
25.0000 mg | ORAL_TABLET | Freq: Every day | ORAL | Status: DC
  Administered 2014-06-14 – 2014-06-17 (×4): 25 mg via ORAL
  Filled 2014-06-13 (×4): qty 1

## 2014-06-13 MED ORDER — ROCURONIUM BROMIDE 50 MG/5ML IV SOLN
INTRAVENOUS | Status: AC
  Filled 2014-06-13: qty 1

## 2014-06-13 MED ORDER — SODIUM CHLORIDE 0.9 % IJ SOLN: 3.0000 mL | INTRAMUSCULAR | Status: DC | PRN

## 2014-06-13 MED ORDER — PROPOFOL 10 MG/ML IV BOLUS
INTRAVENOUS | Status: DC | PRN
  Administered 2014-06-13: 100 mg via INTRAVENOUS

## 2014-06-13 MED ORDER — MIDAZOLAM HCL 5 MG/5ML IJ SOLN
INTRAMUSCULAR | Status: DC | PRN
  Administered 2014-06-13: 2 mg via INTRAVENOUS

## 2014-06-13 MED ORDER — MENTHOL 3 MG MT LOZG: 1.0000 | LOZENGE | OROMUCOSAL | Status: DC | PRN

## 2014-06-13 MED ORDER — NEOSTIGMINE METHYLSULFATE 10 MG/10ML IV SOLN
INTRAVENOUS | Status: AC
  Filled 2014-06-13: qty 1

## 2014-06-13 MED ORDER — BUPIVACAINE HCL (PF) 0.25 % IJ SOLN
INTRAMUSCULAR | Status: DC | PRN
  Administered 2014-06-13: 20 mL

## 2014-06-13 MED ORDER — SODIUM CHLORIDE 0.9 % IR SOLN
Status: DC | PRN
  Administered 2014-06-13 (×2)

## 2014-06-13 MED ORDER — LACTATED RINGERS IV SOLN
INTRAVENOUS | Status: DC | PRN
  Administered 2014-06-13: 08:00:00 via INTRAVENOUS

## 2014-06-13 MED ORDER — LIDOCAINE HCL (CARDIAC) 20 MG/ML IV SOLN
INTRAVENOUS | Status: DC | PRN
  Administered 2014-06-13: 60 mg via INTRAVENOUS

## 2014-06-13 MED ORDER — FLEET ENEMA 7-19 GM/118ML RE ENEM: 1.0000 | ENEMA | Freq: Once | RECTAL | Status: AC | PRN

## 2014-06-13 MED ORDER — VANCOMYCIN HCL 1000 MG IV SOLR
INTRAVENOUS | Status: DC | PRN
  Administered 2014-06-13: 1000 mg

## 2014-06-13 MED ORDER — DOCUSATE SODIUM 100 MG PO CAPS
100.0000 mg | ORAL_CAPSULE | Freq: Two times a day (BID) | ORAL | Status: DC
  Administered 2014-06-13 – 2014-06-17 (×8): 100 mg via ORAL
  Filled 2014-06-13 (×8): qty 1

## 2014-06-13 MED ORDER — DIAZEPAM 5 MG PO TABS
5.0000 mg | ORAL_TABLET | Freq: Four times a day (QID) | ORAL | Status: DC | PRN
  Administered 2014-06-14 – 2014-06-16 (×4): 5 mg via ORAL
  Administered 2014-06-16: 10 mg via ORAL
  Filled 2014-06-13 (×6): qty 1
  Filled 2014-06-13: qty 2

## 2014-06-13 MED ORDER — ONDANSETRON HCL 4 MG/2ML IJ SOLN
INTRAMUSCULAR | Status: DC | PRN
  Administered 2014-06-13: 4 mg via INTRAVENOUS

## 2014-06-13 MED ORDER — FENTANYL CITRATE 0.05 MG/ML IJ SOLN
INTRAMUSCULAR | Status: AC
  Filled 2014-06-13: qty 5

## 2014-06-13 MED ORDER — HYPROMELLOSE (GONIOSCOPIC) 2.5 % OP SOLN: 1.0000 [drp] | OPHTHALMIC | Status: DC | PRN

## 2014-06-13 MED ORDER — PRAVASTATIN SODIUM 20 MG PO TABS
20.0000 mg | ORAL_TABLET | Freq: Every evening | ORAL | Status: DC
  Administered 2014-06-13 – 2014-06-16 (×4): 20 mg via ORAL
  Filled 2014-06-13 (×4): qty 1

## 2014-06-13 MED ORDER — FLUTICASONE PROPIONATE 50 MCG/ACT NA SUSP: 2.0000 | Freq: Every day | NASAL | Status: DC | PRN

## 2014-06-13 MED ORDER — SODIUM CHLORIDE 0.9 % IV SOLN
INTRAVENOUS | Status: DC | PRN
  Administered 2014-06-13: 11:00:00 via INTRAVENOUS

## 2014-06-13 MED ORDER — PROPOFOL 10 MG/ML IV BOLUS
INTRAVENOUS | Status: AC
  Filled 2014-06-13: qty 20

## 2014-06-13 MED ORDER — SODIUM CHLORIDE 0.9 % IJ SOLN
3.0000 mL | Freq: Two times a day (BID) | INTRAMUSCULAR | Status: DC
  Administered 2014-06-14 – 2014-06-17 (×6): 3 mL via INTRAVENOUS

## 2014-06-13 MED ORDER — FENTANYL CITRATE 0.05 MG/ML IJ SOLN
25.0000 ug | INTRAMUSCULAR | Status: DC | PRN
  Administered 2014-06-13: 50 ug via INTRAVENOUS
  Administered 2014-06-13: 25 ug via INTRAVENOUS

## 2014-06-13 MED ORDER — FENTANYL CITRATE 0.05 MG/ML IJ SOLN
INTRAMUSCULAR | Status: DC | PRN
  Administered 2014-06-13 (×5): 50 ug via INTRAVENOUS

## 2014-06-13 MED ORDER — GABAPENTIN 300 MG PO CAPS
300.0000 mg | ORAL_CAPSULE | Freq: Three times a day (TID) | ORAL | Status: DC
  Administered 2014-06-13 – 2014-06-17 (×12): 300 mg via ORAL
  Filled 2014-06-13 (×5): qty 1
  Filled 2014-06-13: qty 3
  Filled 2014-06-13 (×6): qty 1

## 2014-06-13 MED ORDER — SUCCINYLCHOLINE CHLORIDE 20 MG/ML IJ SOLN
INTRAMUSCULAR | Status: AC
  Filled 2014-06-13: qty 1

## 2014-06-13 MED ORDER — ALBUMIN HUMAN 5 % IV SOLN
INTRAVENOUS | Status: DC | PRN
  Administered 2014-06-13: 10:00:00 via INTRAVENOUS

## 2014-06-13 MED ORDER — HYDROCODONE-ACETAMINOPHEN 5-325 MG PO TABS: 1.0000 | ORAL_TABLET | ORAL | Status: DC | PRN

## 2014-06-13 MED ORDER — FENTANYL CITRATE 0.05 MG/ML IJ SOLN
INTRAMUSCULAR | Status: AC
  Filled 2014-06-13: qty 2

## 2014-06-13 MED ORDER — ACETAMINOPHEN 650 MG RE SUPP: 650.0000 mg | RECTAL | Status: DC | PRN

## 2014-06-13 MED ORDER — ONDANSETRON HCL 4 MG/2ML IJ SOLN: 4.0000 mg | Freq: Once | INTRAMUSCULAR | Status: DC | PRN

## 2014-06-13 MED ORDER — BUPROPION HCL ER (XL) 150 MG PO TB24
300.0000 mg | ORAL_TABLET | Freq: Every day | ORAL | Status: DC
  Administered 2014-06-13 – 2014-06-17 (×5): 300 mg via ORAL
  Filled 2014-06-13 (×5): qty 2

## 2014-06-13 MED ORDER — CARBIDOPA-LEVODOPA 25-100 MG PO TABS
1.0000 | ORAL_TABLET | Freq: Two times a day (BID) | ORAL | Status: DC
Start: 2014-06-13 — End: 2014-06-13

## 2014-06-13 MED ORDER — VITAMIN D 1000 UNITS PO TABS
1000.0000 [IU] | ORAL_TABLET | Freq: Every day | ORAL | Status: DC
  Administered 2014-06-14 – 2014-06-17 (×4): 1000 [IU] via ORAL
  Filled 2014-06-13 (×4): qty 1

## 2014-06-13 MED ORDER — CARBIDOPA-LEVODOPA 25-100 MG PO TABS
2.0000 | ORAL_TABLET | Freq: Every day | ORAL | Status: DC
Start: 2014-06-14 — End: 2014-06-17
  Administered 2014-06-14 – 2014-06-17 (×4): 2 via ORAL
  Filled 2014-06-13 (×4): qty 2

## 2014-06-13 MED ORDER — 0.9 % SODIUM CHLORIDE (POUR BTL) OPTIME
TOPICAL | Status: DC | PRN
  Administered 2014-06-13: 1000 mL

## 2014-06-13 MED ORDER — ACETAMINOPHEN 325 MG PO TABS: 650.0000 mg | ORAL_TABLET | ORAL | Status: DC | PRN

## 2014-06-13 MED ORDER — BISACODYL 10 MG RE SUPP: 10.0000 mg | Freq: Every day | RECTAL | Status: DC | PRN

## 2014-06-13 MED ORDER — THROMBIN 20000 UNITS EX SOLR
CUTANEOUS | Status: DC | PRN
  Administered 2014-06-13: 07:00:00 via TOPICAL

## 2014-06-13 MED ORDER — SODIUM CHLORIDE 0.9 % IV SOLN
250.0000 mL | INTRAVENOUS | Status: DC
  Administered 2014-06-13: 250 mL via INTRAVENOUS

## 2014-06-13 MED ORDER — ONDANSETRON HCL 4 MG/2ML IJ SOLN: 4.0000 mg | INTRAMUSCULAR | Status: DC | PRN

## 2014-06-13 MED ORDER — NAPHAZOLINE HCL 0.1 % OP SOLN: 1.0000 [drp] | Freq: Four times a day (QID) | OPHTHALMIC | Status: DC | PRN

## 2014-06-13 MED ORDER — PHENYLEPHRINE 40 MCG/ML (10ML) SYRINGE FOR IV PUSH (FOR BLOOD PRESSURE SUPPORT)
PREFILLED_SYRINGE | INTRAVENOUS | Status: AC
  Filled 2014-06-13: qty 10

## 2014-06-13 SURGICAL SUPPLY — 62 items
BAG DECANTER FOR FLEXI CONT (MISCELLANEOUS) ×2 IMPLANT
BENZOIN TINCTURE PRP APPL 2/3 (GAUZE/BANDAGES/DRESSINGS) ×2 IMPLANT
BLADE CLIPPER SURG (BLADE) ×2 IMPLANT
BRUSH SCRUB EZ PLAIN DRY (MISCELLANEOUS) ×2 IMPLANT
BUR CUTTER 7.0 ROUND (BURR) ×2 IMPLANT
BUR MATCHSTICK NEURO 3.0 LAGG (BURR) ×2 IMPLANT
CAGE CAPSTONE 10X22X6 (Cage) ×4 IMPLANT
CANISTER SUCT 3000ML PPV (MISCELLANEOUS) ×2 IMPLANT
CAP LCK SPNE (Orthopedic Implant) ×4 IMPLANT
CAP LOCK SPINE RADIUS (Orthopedic Implant) ×4 IMPLANT
CAP LOCKING (Orthopedic Implant) ×4 IMPLANT
CONT SPEC 4OZ CLIKSEAL STRL BL (MISCELLANEOUS) ×4 IMPLANT
COVER BACK TABLE 60X90IN (DRAPES) ×2 IMPLANT
DECANTER SPIKE VIAL GLASS SM (MISCELLANEOUS) ×2 IMPLANT
DRAPE C-ARM 42X72 X-RAY (DRAPES) ×4 IMPLANT
DRAPE LAPAROTOMY 100X72X124 (DRAPES) ×2 IMPLANT
DRAPE POUCH INSTRU U-SHP 10X18 (DRAPES) ×2 IMPLANT
DRAPE PROXIMA HALF (DRAPES) IMPLANT
DRAPE SURG 17X23 STRL (DRAPES) ×8 IMPLANT
DRSG OPSITE POSTOP 4X8 (GAUZE/BANDAGES/DRESSINGS) ×2 IMPLANT
DURAPREP 26ML APPLICATOR (WOUND CARE) ×2 IMPLANT
ELECT REM PT RETURN 9FT ADLT (ELECTROSURGICAL) ×2
ELECTRODE REM PT RTRN 9FT ADLT (ELECTROSURGICAL) ×1 IMPLANT
EVACUATOR 1/8 PVC DRAIN (DRAIN) ×2 IMPLANT
GAUZE SPONGE 4X4 12PLY STRL (GAUZE/BANDAGES/DRESSINGS) ×2 IMPLANT
GAUZE SPONGE 4X4 16PLY XRAY LF (GAUZE/BANDAGES/DRESSINGS) IMPLANT
GLOVE BIOGEL PI IND STRL 8.5 (GLOVE) ×3 IMPLANT
GLOVE BIOGEL PI INDICATOR 8.5 (GLOVE) ×3
GLOVE ECLIPSE 9.0 STRL (GLOVE) ×6 IMPLANT
GLOVE EXAM NITRILE LRG STRL (GLOVE) IMPLANT
GLOVE EXAM NITRILE MD LF STRL (GLOVE) IMPLANT
GLOVE EXAM NITRILE XL STR (GLOVE) IMPLANT
GLOVE EXAM NITRILE XS STR PU (GLOVE) IMPLANT
GLOVE INDICATOR 7.5 STRL GRN (GLOVE) ×8 IMPLANT
GLOVE OPTIFIT SS 7.5 STRL LX (GLOVE) ×2 IMPLANT
GOWN STRL REUS W/ TWL LRG LVL3 (GOWN DISPOSABLE) IMPLANT
GOWN STRL REUS W/ TWL XL LVL3 (GOWN DISPOSABLE) ×4 IMPLANT
GOWN STRL REUS W/TWL 2XL LVL3 (GOWN DISPOSABLE) IMPLANT
GOWN STRL REUS W/TWL LRG LVL3 (GOWN DISPOSABLE)
GOWN STRL REUS W/TWL XL LVL3 (GOWN DISPOSABLE) ×4
KIT BASIN OR (CUSTOM PROCEDURE TRAY) ×2 IMPLANT
KIT ROOM TURNOVER OR (KITS) ×2 IMPLANT
LIQUID BAND (GAUZE/BANDAGES/DRESSINGS) ×2 IMPLANT
NEEDLE HYPO 22GX1.5 SAFETY (NEEDLE) ×2 IMPLANT
NS IRRIG 1000ML POUR BTL (IV SOLUTION) ×2 IMPLANT
PACK LAMINECTOMY NEURO (CUSTOM PROCEDURE TRAY) ×2 IMPLANT
ROD RADIUS 40MM (Neuro Prosthesis/Implant) ×2 IMPLANT
ROD SPNL 40X5.5XNS TI RDS (Neuro Prosthesis/Implant) ×2 IMPLANT
SCREW 6.75X40MM (Screw) ×4 IMPLANT
SCREW 6.75X45MM (Screw) ×4 IMPLANT
SPONGE SURGIFOAM ABS GEL 100 (HEMOSTASIS) ×2 IMPLANT
STRIP CLOSURE SKIN 1/2X4 (GAUZE/BANDAGES/DRESSINGS) ×2 IMPLANT
SUT VIC AB 0 CT1 18XCR BRD8 (SUTURE) ×3 IMPLANT
SUT VIC AB 0 CT1 8-18 (SUTURE) ×3
SUT VIC AB 2-0 CT1 18 (SUTURE) ×4 IMPLANT
SUT VIC AB 3-0 SH 8-18 (SUTURE) ×4 IMPLANT
SYR 20ML ECCENTRIC (SYRINGE) ×2 IMPLANT
TOWEL OR 17X24 6PK STRL BLUE (TOWEL DISPOSABLE) ×2 IMPLANT
TOWEL OR 17X26 10 PK STRL BLUE (TOWEL DISPOSABLE) ×2 IMPLANT
TRAY FOLEY CATH 14FRSI W/METER (CATHETERS) IMPLANT
TRAY FOLEY CATH 16FRSI W/METER (SET/KITS/TRAYS/PACK) ×2 IMPLANT
WATER STERILE IRR 1000ML POUR (IV SOLUTION) ×2 IMPLANT

## 2014-06-13 NOTE — Brief Op Note (Signed)
06/13/2014  11:34 AM  PATIENT:  Zandra Abts Sen  64 y.o. male  PRE-OPERATIVE DIAGNOSIS:  spondylolisthesis  POST-OPERATIVE DIAGNOSIS:  spondylolisthesis  PROCEDURE:  Procedure(s): Lumbar four-five posterior lumbar interbody fusion, Lumbar one-two decompression lumbar laminectomy (N/A)  SURGEON:  Surgeon(s) and Role:    * Charlie Pitter, MD - Primary    * Erline Levine, MD - Assisting  PHYSICIAN ASSISTANT:   ASSISTANTS:    ANESTHESIA:   general  EBL:  Total I/O In: 2400 [I.V.:2000; Blood:150; IV Piggyback:250] Out: 950 [Urine:500; Blood:450]  BLOOD ADMINISTERED:none  DRAINS: (Medium) Hemovact drain(s) in the Epidural space with  Suction Open   LOCAL MEDICATIONS USED:  MARCAINE     SPECIMEN:  No Specimen  DISPOSITION OF SPECIMEN:  N/A  COUNTS:  YES  TOURNIQUET:  * No tourniquets in log *  DICTATION: .Dragon Dictation  PLAN OF CARE: Admit to inpatient   PATIENT DISPOSITION:  PACU - hemodynamically stable.   Delay start of Pharmacological VTE agent (>24hrs) due to surgical blood loss or risk of bleeding: yes

## 2014-06-13 NOTE — Progress Notes (Signed)
Patient ID: [name], male   DOB: 1950-08-27, 64 y.o.   MRN: [id number] Per Dr. Melven Sartorius order, deep brain stimulator IPG's (bilateral) turned "on" to previous settings upon completion of surgery, while still under anesthesia.   Verdis Prime RN, BSN

## 2014-06-13 NOTE — Progress Notes (Signed)
Orthopedic Tech Progress Note Patient Details:  [name] 04-14-1951 [id number]  Patient ID: [name], male   DOB: December 05, 1950, 64 y.o.   MRN: [id number] rn stated that pt already has back brace in room  [name] 06/13/2014, 3:13 PM

## 2014-06-13 NOTE — H&P (Addendum)
[name] is an 64 y.o. male.   Chief Complaint: Back and bilateral leg pain HPI: 64 year old male with severe Parkinson's disease who is status post previous L4-5 decompressive surgery. Patient suffered a fall with marked increase in his back pain. Workup demonstrates evidence of traumatic spondylolysis of L4 bilaterally with dynamic instability and recurrent stenosis. Patient has failed conservative management. He has severe back and bilateral leg pain with standing or walking. Patient also has critical stenosis at L1-L2. This will be addressed at the same time. He presents now for decompression and fusion surgery.  Past Medical History  Diagnosis Date  . Parkinson's disease     dx'ed 15 years ago  . PTSD (post-traumatic stress disorder)   . Bradycardia   . Hypertension     treated with HCTZ  . Sleep apnea     doesn't use C-pap  . Varicose veins   . Cancer 2013    skin cancer  . Arthritis   . History of chicken pox   . GERD (gastroesophageal reflux disease)   . Dysrhythmia     chronic slow heart rate  . Depression     ptsd  . History of kidney stones   . Headache(784.0)     tension headaches non recent  . Shortness of breath dyspnea     Past Surgical History  Procedure Laterality Date  . Cyst removed       from lip as a child  . Skin cancer removed      from ears,   12 lft arm  rt leg 15  . Lumbar laminectomy/decompression microdiscectomy Bilateral 12/14/2012    Procedure: Bilateral lumbar three-four, four-five decompressive laminotomy/foraminotomy;  Surgeon: Charlie Pitter, MD;  Location: [place] [facility];  Service: Neurosurgery;  Laterality: Bilateral;  . Subthalamic stimulator insertion Bilateral 12/06/2013    Procedure: SUBTHALAMIC STIMULATOR INSERTION;  Surgeon: Erline Levine, MD;  Location: [place] [facility];  Service: Neurosurgery;  Laterality: Bilateral;  Bilateral deep brain stimulator placement  . Pulse generator implant Bilateral 12/13/2013    Procedure: Bilateral  implantable pulse generator placement;  Surgeon: Erline Levine, MD;  Location: [place] [facility];  Service: Neurosurgery;  Laterality: Bilateral;  Bilateral implantable pulse generator placement    Family History  Problem Relation Age of Onset  . Colon cancer Neg Hx   . Prostate cancer Neg Hx    Social History:  reports that he has never smoked. He has quit using smokeless tobacco. He reports that he drinks alcohol. He reports that he does not use illicit drugs.  Allergies: No Known Allergies  Medications Prior to Admission  Medication Sig Dispense Refill  . buPROPion (WELLBUTRIN XL) 300 MG 24 hr tablet Take 1 tablet (300 mg total) by mouth daily. 90 tablet 2  . carbidopa-levodopa (SINEMET IR) 25-100 MG per tablet Take 1-2 tablets by mouth 2 (two) times daily. Take 2 tablets by mouth in the morning, 1 tablets in the afternoon and take 1 tablet by mouth in the evening.    . cholecalciferol (VITAMIN D) 1000 UNITS tablet Take 1,000 Units by mouth daily.    . fluticasone (FLONASE) 50 MCG/ACT nasal spray Place 2 sprays into both nostrils daily as needed for allergies.     Marland Kitchen gabapentin (NEURONTIN) 300 MG capsule Take 1 capsule (300 mg total) by mouth 3 (three) times daily. 270 capsule 3  . hydrochlorothiazide (HYDRODIURIL) 25 MG tablet Take 1 tablet (25 mg total) by mouth daily. 90 tablet 3  . HYDROcodone-acetaminophen (NORCO/VICODIN) 5-325  MG per tablet Take 1 tablet by mouth every 6 (six) hours as needed for moderate pain.    . pravastatin (PRAVACHOL) 20 MG tablet Take 1 tablet (20 mg total) by mouth every evening. 90 tablet 3  . tetrahydrozoline 0.05 % ophthalmic solution Place 1 drop into both eyes 2 (two) times daily as needed (for itchy eyes).    . Polyethylene Glycol 400 (BLINK TEARS) 0.25 % GEL Apply 1 drop to eye as needed (for dry eyes).     Dema Severin Petrolatum-Mineral Oil (STYE OP) Apply 1 application to eye 2 (two) times daily as needed (for styes).      No results found for this or any  previous visit (from the past 48 hour(s)). No results found.  Pertinent items are noted in HPI.  Blood pressure 120/66, pulse 48, temperature 97.6 F (36.4 C), temperature source Oral, resp. rate 18, weight 83.008 kg (183 lb), SpO2 97 %.  Patient is awake and alert. He is oriented somewhat blunted but overall appropriate. Cognition is normal. Cranial nerve function is intact. Motor and sensory function extremities are normal. Patient with generalized tremulousness. Examination head ears eyes and throat is unremarkable. Chest and abdomen benign. Extremities free from injury deformity. Deep brain stimulator in place. Assessment/Plan Bilateral L4 traumatic spondylolysis with spondylolisthesis. L1-2 stenosis. Plan L1-L2 decompressive laminectomy and reexploration of laminotomies with bilateral decompressive laminectomy and foraminotomies followed by posterior lumbar interbody fusion utilizing interbody cage, locally harvested autograft and coupled with posterior lateral arthrodesis utilizing nonsegmental pedicle screw instrumentation and local autograft. Risks metaphyseal been explained. Patient wishes to proceed.  Jaleel Allen A 06/13/2014, 7:40 AM

## 2014-06-13 NOTE — Progress Notes (Signed)
INITIAL NUTRITION ASSESSMENT  DOCUMENTATION CODES Per approved criteria  -Not Applicable   INTERVENTION: Provide Ensure Complete BID, each supplement provides 350 kcal and 13 grams of protein Encourage adequate PO intake  NUTRITION DIAGNOSIS: Predicted sub optimal energy intake related to poor appetite as evidenced by pt's report.   Goal: Pt to meet >/= 90% of their estimated nutrition needs   Monitor:  PO intake, supplement acceptance, weight trend, labs  Reason for Assessment: Malnutrition Screening Tool, score of 2  64 y.o. male  Admitting Dx: Spinal stenosis, lumbar region, with neurogenic claudication  ASSESSMENT: 64 year old male with severe Parkinson's disease who is status post previous L4-5 decompressive surgery. Presents s/p fall with back and bilateral leg pain.   S/P Lumbar four-five posterior lumbar interbody fusion, lumbar one-two decompression lumbar laminectomy.   Patient states he has no appetite today. He reports having a decreased appetite and eating less for one month PTA. He denies weight loss, reporting that he maintains his weight between 175 and 190 lbs. Patient is agreeable to receiving Ensure Complete to supplement meals until his appetite improves. Pt appears well-nourished.   Labs and medications reviewed.   Height: Ht Readings from Last 1 Encounters:  05/28/14 5\' 9"  (1.753 m)    Weight: Wt Readings from Last 1 Encounters:  06/13/14 183 lb (83.008 kg)    Ideal Body Weight: 160 lbs  % Ideal Body Weight: 114%  Wt Readings from Last 10 Encounters:  06/13/14 183 lb (83.008 kg)  05/28/14 186 lb 8 oz (84.596 kg)  05/08/14 176 lb (79.833 kg)  04/15/14 184 lb (83.462 kg)  02/17/14 188 lb (85.276 kg)  01/22/14 186 lb (84.369 kg)  01/17/14 186 lb 6.4 oz (84.55 kg)  01/16/14 185 lb (83.915 kg)  01/08/14 181 lb (82.101 kg)  12/13/13 181 lb (82.101 kg)    Usual Body Weight: 175-180 lbs  % Usual Body Weight: 102%  BMI:  Body mass index is  27.01 kg/(m^2).  Estimated Nutritional Needs: Kcal: 1900-2100 Protein: 100-110 grams Fluid: 2.1 L/day  Skin: back incision with closed system drain   Diet Order: Diet Heart  EDUCATION NEEDS: -No education needs identified at this time   Intake/Output Summary (Last 24 hours) at 06/13/14 1548 Last data filed at 06/13/14 1358  Gross per 24 hour  Intake   2400 ml  Output   1350 ml  Net   1050 ml    Last BM: 2/25   Labs:  No results for input(s): NA, K, CL, CO2, BUN, CREATININE, CALCIUM, MG, PHOS, GLUCOSE in the last 168 hours.  CBG (last 3)  No results for input(s): GLUCAP in the last 72 hours.  Scheduled Meds: . buPROPion  300 mg Oral Daily  . [START ON 06/14/2014] carbidopa-levodopa  2 tablet Oral QAC breakfast   And  . carbidopa-levodopa  1 tablet Oral 2 times per day  .  ceFAZolin (ANCEF) IV  1 g Intravenous Q8H  . [START ON 06/14/2014] cholecalciferol  1,000 Units Oral Daily  . docusate sodium  100 mg Oral BID  . fentaNYL      . gabapentin  300 mg Oral TID  . [START ON 06/14/2014] hydrochlorothiazide  25 mg Oral Daily  . pravastatin  20 mg Oral QPM  . sodium chloride  3 mL Intravenous Q12H  . vancomycin        Continuous Infusions: . sodium chloride 250 mL (06/13/14 1456)    Past Medical History  Diagnosis Date  . Parkinson's disease  dx'ed 15 years ago  . PTSD (post-traumatic stress disorder)   . Bradycardia   . Hypertension     treated with HCTZ  . Sleep apnea     doesn't use C-pap  . Varicose veins   . Cancer 2013    skin cancer  . Arthritis   . History of chicken pox   . GERD (gastroesophageal reflux disease)   . Dysrhythmia     chronic slow heart rate  . Depression     ptsd  . History of kidney stones   . Headache(784.0)     tension headaches non recent  . Shortness of breath dyspnea     Past Surgical History  Procedure Laterality Date  . Cyst removed       from lip as a child  . Skin cancer removed      from ears,   11 lft arm   rt leg 14  . Lumbar laminectomy/decompression microdiscectomy Bilateral 12/14/2012    Procedure: Bilateral lumbar three-four, four-five decompressive laminotomy/foraminotomy;  Surgeon: Charlie Pitter, MD;  Location: [facility];  Service: Neurosurgery;  Laterality: Bilateral;  . Subthalamic stimulator insertion Bilateral 12/06/2013    Procedure: SUBTHALAMIC STIMULATOR INSERTION;  Surgeon: Erline Levine, MD;  Location: [place] [facility];  Service: Neurosurgery;  Laterality: Bilateral;  Bilateral deep brain stimulator placement  . Pulse generator implant Bilateral 12/13/2013    Procedure: Bilateral implantable pulse generator placement;  Surgeon: Erline Levine, MD;  Location: [place] [facility];  Service: Neurosurgery;  Laterality: Bilateral;  Bilateral implantable pulse generator placement    Pryor Ochoa RD, LDN Inpatient Clinical Dietitian Pager: [phone] After Hours Pager: [phone]

## 2014-06-13 NOTE — Op Note (Signed)
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  Postoperative diagnosis: Same  Procedure Name: L1-2 decompressive laminectomy with foraminotomies  Reexploration of L4-5 laminotomies with bilateral complete decompressive laminectomy and extensive foraminotomies, more than would be required for simple interbody fusion alone.  L4-5 posterior lumbar interbody fusion utilizing interbody peek cages and local autographing  L4-5 posterior lateral arthrodesis utilizing nonsegmental pedicle screw instrumentation and local autograft.  Surgeon:Vihan Santagata A.Elanah Osmanovic, M.D.  Asst. Surgeon: Vertell Limber  Anesthesia: General  Indication: 64 year old male status post previous L3-4 and L4-5 decompressive laminotomies. Patient with profound Parkinson's disease with intermittent episodes of atonia with falling. The patient suffered a fall and had marked increase in his back pain. Workup demonstrates evidence of new bilateral L4 fractures through his pars interarticularis with an unstable spondylolisthesis. Workup also demonstrates evidence of severe spinal stenosis at L1-L2. Patient presents now for decompression at L1-L2 and decompression and fusion at L4-5.  Operative note: after induction of anesthesia, patient position prone onto Wilson frame and appropriately padded. Lumbar region prepped and draped. Incision made overlying L1-2. Dissection performed. Retractor placed. Fluoroscopy used. Level confirmed. Decompressive laminectomy performed using Leksell rongeurs Kerrison rongeurs and high-speed drill to remove the inferior three quarters of the lamina of L1 medial aspect the L1-2 facet joint and the superior rim of the L2 lamina. Ligament flavum elevated and resected thecal fashion. Underlying thecal sac identified. Decompression completed by undercutting the gutters of the facet joints  bilaterally. At this point a very thorough decompression achieved. There was no evidence of injury to thecal sac or nerve roots. Wound was then irrigated. Gelfoam was placed topically for hemostasis and the wound is then closed in typical fashion. Incision was then made overlying L4-5. Subperiosteal dissection performed bilaterally exposing the lamina facet joints and transverse processes bilaterally. Retractor placed. Fluoroscopy used. Level confirmed. Previous laminotomies at L4-5 dissected free. Complete laminectomies and performed using Leksell rongeurs Kerrison years high-speed drill to remove the entire lamina of L4 inferior facets of L4 bilaterally. Superior facet facets of L5 bilaterally. Ligament flavum was elevated and resected these will fashion using Kerrison rongeurs as was epidural scarring. At this point the thecal sac and L4 and L5 nerve roots were skeletonized and widely decompressed by means of aggressive foraminotomies. Bilateral discectomies then performed at L4-5. Distractor placed. Disc space distracted up to 10 mm. With a 10 mm distractor on the left side disc space was then reamed and scraped with various curettes. A 10 x 22 x 6 Medtronic peek cage packed with morselized autograft was impacted into position and rotated into final position. Distractor removed patient's left side. Thecal sac and nerve respect on the left side. Disc space was again prepared on the left side. Morselized autograft obtained during the decompression was then packed into the interspace. A second cage packed with morselized autograft was then impacted into position and rotated into final position. Pedicles of L4 and L5 were notified using surface landmarks and intraoperative fluoroscopy. Superficial bone overlying the pedicle was then removed using high-speed drill. Each pedicles and probed using pedicle awl. Each pedicle awl track was probed and found to be solidly within the bone. Each pedicle awl track was then  tapped with a screw tap and then 6.75 mm radius brand screws from [employer] were placed bilaterally at L4 and L5. Transverse processes of L4 and L5 placed or decorticated using high-speed drill. Morselized autograft packed posterior laterally  for later fusion. Short segment titanium rod placed or the screw heads at L4 and L5. Locking caps placed over the screw heads. Locking caps engaged with the construct under compression. Final images revealed good position bone graft hardware at proper upper level with normal lamina spine. Wound is irrigated one final time. Gelfoam was placed topically hemostasis. A medium Hemovac drain was left in epidural space. Vancomycin powder was placed the deep wound space. Wound is close in layers. Steri-Strips and sterile dressing were applied. No apparent complications. Patient tolerated the procedure well and he returns to the recovery room postop.

## 2014-06-13 NOTE — Transfer of Care (Signed)
Immediate Anesthesia Transfer of Care Note  Patient: [name]  Procedure(s) Performed: Procedure(s): Lumbar four-five posterior lumbar interbody fusion, Lumbar one-two decompression lumbar laminectomy (N/A)  Patient Location: PACU  Anesthesia Type:General  Level of Consciousness: awake, alert  and oriented  Airway & Oxygen Therapy: Patient connected to face mask oxygen  Post-op Assessment: Report given to RN  Post vital signs: stable  Last Vitals:  Filed Vitals:   06/13/14 0612  BP: 120/66  Pulse: 48  Temp: 36.4 C  Resp: 18    Complications: No apparent anesthesia complications

## 2014-06-13 NOTE — Progress Notes (Signed)
Pt is admitted to room 4n19 from pacu.  Admission vital signs are stable

## 2014-06-13 NOTE — Progress Notes (Signed)
Patient ID: [name], male   DOB: 1951/02/25, 64 y.o.   MRN: [id number] Per Dr. Melven Sartorius order, bilateral Implanted Pulse Generators interrogated and deep brain stimulation turned "off" in both while under anesthesia, prior to surgery.  Both will be turned on at completion of surgery while still under anesthesia to minimize pts discomfort.   Verdis Prime RN BSN

## 2014-06-13 NOTE — Anesthesia Procedure Notes (Signed)
Procedure Name: Intubation Date/Time: 06/13/2014 7:50 AM Performed by: Maeola Harman Pre-anesthesia Checklist: Patient identified, Emergency Drugs available, Suction available, Patient being monitored and Timeout performed Patient Re-evaluated:Patient Re-evaluated prior to inductionOxygen Delivery Method: Circle system utilized Preoxygenation: Pre-oxygenation with 100% oxygen Intubation Type: IV induction Ventilation: Mask ventilation without difficulty Laryngoscope Size: Mac and 4 Grade View: Grade I Tube type: Oral Tube size: 7.5 mm Number of attempts: 1 Airway Equipment and Method: Stylet Placement Confirmation: ETT inserted through vocal cords under direct vision,  positive ETCO2 and breath sounds checked- equal and bilateral Secured at: 22 cm Tube secured with: Tape Dental Injury: Teeth and Oropharynx as per pre-operative assessment  Comments: Easy atraumatic induction and intubation with MAC 4 blade.  Dr. Jillyn Hidden verified placement of ETT.  George Session, CRNA

## 2014-06-13 NOTE — Progress Notes (Signed)
Stop check. Overall doing well. Pain well controlled. Moving both lower extremities well. Right lower extremity feels better. Drain output low.

## 2014-06-13 NOTE — Anesthesia Postprocedure Evaluation (Signed)
  Anesthesia Post-op Note  Patient: [name]  Procedure(s) Performed: Procedure(s) (LRB): Lumbar four-five posterior lumbar interbody fusion, Lumbar one-two decompression lumbar laminectomy (N/A)  Patient Location: PACU  Anesthesia Type: General  Level of Consciousness: awake and alert   Airway and Oxygen Therapy: Patient Spontanous Breathing  Post-op Pain: mild  Post-op Assessment: Post-op Vital signs reviewed, Patient's Cardiovascular Status Stable, Respiratory Function Stable, Patent Airway and No signs of Nausea or vomiting  Last Vitals:  Filed Vitals:   06/13/14 1248  BP:   Pulse: 54  Temp:   Resp: 13    Post-op Vital Signs: stable   Complications: No apparent anesthesia complications

## 2014-06-14 NOTE — Progress Notes (Signed)
No issues overnight. Pt reports improvement in preop RLE numbness and buttock pain.   EXAM:  BP 110/66 mmHg  Pulse 57  Temp(Src) 98.2 F (36.8 C) (Oral)  Resp 18  Wt 83.008 kg (183 lb)  SpO2 99%  Awake, alert, oriented  Speech fluent, appropriate  CN grossly intact  5/5 BUE/BLE   IMPRESSION:  64 y.o. male POD#1 s/p L4-5 PLIF, neurologically at baseline  PLAN: - Mobilize today - Possibly home tomorrow

## 2014-06-14 NOTE — Evaluation (Signed)
Physical Therapy Evaluation Patient Details Name: [name] MRN: [id number] DOB: [birth year] Today's Date: 06/14/2014   History of Present Illness  64 year old male with severe Parkinson's disease who is status post previous L4-5 decompressive surgery. Patient suffered a fall with marked increase in his back pain. 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 Patient failed conservative management. He had severe back and bilateral leg pain with standing and walking. Patient also has critical stenosis at L1-L2, all which were addressed during decompression and fusion surgery.  Clinical Impression  Patient was initiating OPPT at [facility] with PD-based exercise program.  He reports improved mobility now that DBS turned back on after back surgery.  Patient demonstrates decr functional mobility.  He may benefit from skilled PT to address bed mobility, transfers, gait and strength on this level of care in order to discharge home with patient's wife.  He may benefit from resuming OPPT with PD focus. Patient verbalized understanding of [place] support groups for PD.    Follow Up Recommendations Outpatient PT;Supervision/Assistance - 24 hour    Equipment Recommendations  None recommended by PT    Recommendations for Other Services       Precautions / Restrictions Precautions Precautions: Back Precaution Booklet Issued: Yes (comment) Precaution Comments: instructed in back precautions Required Braces or Orthoses: Spinal Brace Spinal Brace: Applied in sitting position;Applied in standing position Restrictions Weight Bearing Restrictions: No      Mobility  Bed Mobility Overal bed mobility: Needs Assistance Bed Mobility: Rolling;Sidelying to Sit Rolling: Min assist Sidelying to sit: Min assist       General bed mobility comments: VCs for technique and pt used rail  Transfers Overall transfer level:  Needs assistance Equipment used: Rolling walker (2 wheeled) Transfers: Sit to/from Stand Sit to Stand: Min guard         General transfer comment: VCs for safe hand placement  Ambulation/Gait Ambulation/Gait assistance: Min guard Ambulation Distance (Feet): 170 Feet Assistive device: Rolling walker (2 wheeled) Gait Pattern/deviations: Shuffle (min cues for walker placement during turns)     General Gait Details: decr environmental scanning  Stairs            Wheelchair Mobility    Modified Rankin (Stroke Patients Only)       Balance Overall balance assessment: Needs assistance Sitting-balance support: No upper extremity supported Sitting balance-Leahy Scale: Good     Standing balance support: Bilateral upper extremity supported Standing balance-Leahy Scale: Fair                               Pertinent Vitals/Pain Pain Assessment: No/denies pain    Home Living Family/patient expects to be discharged to:: Private residence Living Arrangements: Spouse/significant other Available Help at Discharge: Family;Available 24 hours/day Type of Home: House Home Access: Ramped entrance     Home Layout: Two level;Able to live on main level with bedroom/bathroom Home Equipment: Gilford Rile - 2 wheels;Shower seat;Hand held shower head;Grab bars - tub/shower (u-step walker) Additional Comments: Pt lives in one story home with basement.  However pt does go to basement which where shop is located.     Prior Function Level of Independence: Independent with assistive device(s)               Hand Dominance   Dominant Hand: Right    Extremity/Trunk Assessment   Upper Extremity Assessment: Defer to OT evaluation RUE Deficits /  Details: Wife reports and pt confirms that he has problems with getting spoon/fork turned to get to his mouth after he gets food on it         Lower Extremity Assessment: Overall WFL for tasks assessed      Cervical / Trunk  Assessment: Normal  Communication   Communication: Expressive difficulties (low voice volume)  Cognition Arousal/Alertness: Awake/alert Behavior During Therapy: WFL for tasks assessed/performed Overall Cognitive Status: Within Functional Limits for tasks assessed                      General Comments      Exercises        Assessment/Plan    PT Assessment Patient needs continued PT services  PT Diagnosis Difficulty walking;Abnormality of gait;Acute pain   PT Problem List Decreased activity tolerance;Decreased balance;Decreased mobility;Decreased knowledge of precautions;Decreased range of motion;Pain  PT Treatment Interventions DME instruction;Gait training;Stair training;Functional mobility training;Therapeutic activities;Therapeutic exercise;Balance training;Patient/family education   PT Goals (Current goals can be found in the Care Plan section) Acute Rehab PT Goals Patient Stated Goal: return to PD-based therapy PT Goal Formulation: With patient Time For Goal Achievement: 06/21/14 Potential to Achieve Goals: Good    Frequency Min 5X/week   Barriers to discharge        Co-evaluation PT/OT/SLP Co-Evaluation/Treatment: Yes Reason for Co-Treatment: Complexity of the patient's impairments (multi-system involvement) PT goals addressed during session: Mobility/safety with mobility;Balance;Proper use of DME OT goals addressed during session: Strengthening/ROM;ADL's and self-care;Proper use of Adaptive equipment and DME       End of Session Equipment Utilized During Treatment: Back brace Activity Tolerance: Patient tolerated treatment well Patient left: in chair;with call bell/phone within reach;with family/visitor present Nurse Communication: Mobility status         Time: 9030-0923 PT Time Calculation (min) (ACUTE ONLY): 36 min   Charges:   PT Evaluation $Initial PT Evaluation Tier I: 1 Procedure PT Treatments $Gait Training: 8-22 mins   PT G CodesMalka So, [place] [phone] [place] 06/14/2014, 3:49 PM

## 2014-06-14 NOTE — Evaluation (Signed)
Occupational Therapy Evaluation Patient Details Name: [name] MRN: [id number] DOB: 11/21/1950 Today's Date: 06/14/2014    History of Present Illness L1-2 decompressive laminectomy with foraminotomies and L4-5 fusion. PNHx: deep brain stimulator for Parkinson's, PTSD (from serving in [employer]), depression, HTN,   Clinical Impression   This 64 yo male admitted with above presents to acute OT with decreased mobility, decreased balance, Parkinson's, and now back precautions all affecting his ability to care for himself at a Mod I level as he was prior to surgery. He will benefit from one more session of OT to address all toileting aspects including tranfers, toilet transfers, and LBADLs.    Follow Up Recommendations  No OT follow up    Equipment Recommendations  None recommended by OT       Precautions / Restrictions Precautions Precautions: Back Required Braces or Orthoses: Spinal Brace Spinal Brace: Applied in sitting position;Applied in standing position Restrictions Weight Bearing Restrictions: No      Mobility Bed Mobility Overal bed mobility: Needs Assistance Bed Mobility: Rolling;Sidelying to Sit Rolling: Min assist Sidelying to sit: Min assist       General bed mobility comments: VCs for technique and pt used rail  Transfers Overall transfer level: Needs assistance Equipment used: Rolling walker (2 wheeled) Transfers: Sit to/from Stand Sit to Stand: Min guard         General transfer comment: VCs for safe hand placement         ADL Overall ADL's : Needs assistance/impaired Eating/Feeding: Modified independent;Sitting   Grooming: Set up;Supervision/safety;Sitting   Upper Body Bathing: Set up;Supervision/ safety;Sitting   Lower Body Bathing: Minimal assistance (with min guard A sit<>stand)   Upper Body Dressing : Supervision/safety;Set up;Sitting   Lower Body Dressing: Minimal assistance (with min guard A sit<>stand)   Toilet Transfer: Min  guard;Ambulation;RW (Bed>out and around 1/4 of unit>back to sit in recliner)   Toileting- Clothing Manipulation and Hygiene: Minimal assistance (with min guard A sit<>stand)                         Pertinent Vitals/Pain Pain Assessment: No/denies pain     Hand Dominance Right   Extremity/Trunk Assessment Upper Extremity Assessment Upper Extremity Assessment: RUE deficits/detail RUE Deficits / Details: Wife reports and pt confirms that he has problems with getting spoon/fork turned to get to his mouth after he gets food on it RUE Coordination: decreased gross motor           Communication Communication Communication: Expressive difficulties (due to Parkinson's)   Cognition Arousal/Alertness: Awake/alert Behavior During Therapy: WFL for tasks assessed/performed Overall Cognitive Status: Within Functional Limits for tasks assessed                                Home Living Family/patient expects to be discharged to:: Private residence Living Arrangements: Spouse/significant other Available Help at Discharge: Family;Available 24 hours/day Type of Home: House Home Access: Ramped entrance     Home Layout: Two level;Able to live on main level with bedroom/bathroom Alternate Level Stairs-Number of Steps: 13 steps Alternate Level Stairs-Rails: Right Bathroom Shower/Tub: Tub/shower unit Shower/tub characteristics: Curtain Bathroom Toilet: Handicapped height Bathroom Accessibility: Yes   Home Equipment: Environmental consultant - 2 wheels;Shower seat;Hand held shower head;Grab bars - tub/shower (craft matic bed)   Additional Comments: Pt lives in one story home with basement.  However pt does go to basement which where shop is located.  Prior Functioning/Environment Level of Independence: Independent             OT Diagnosis: Generalized weakness   OT Problem List: Decreased strength;Decreased range of motion;Impaired balance (sitting and/or  standing);Decreased knowledge of precautions;Decreased knowledge of use of DME or AE   OT Treatment/Interventions: Self-care/ADL training;Balance training;Patient/family education;DME and/or AE instruction    OT Goals(Current goals can be found in the care plan section) Acute Rehab OT Goals Patient Stated Goal: home probably tomorrow OT Goal Formulation: With patient/family Time For Goal Achievement: 06/14/14 Potential to Achieve Goals: Good  OT Frequency: Min 2X/week           Co-evaluation PT/OT/SLP Co-Evaluation/Treatment: Yes Reason for Co-Treatment: For patient/therapist safety (weren't sure how pt would do with Parkinson's and back surgery)   OT goals addressed during session: Strengthening/ROM;ADL's and self-care;Proper use of Adaptive equipment and DME      End of Session Equipment Utilized During Treatment: Rolling walker;Back brace Nurse Communication:  (If pt wants position of recliner changed, nursing staff need to do it for him due to his back surgery)  Activity Tolerance: Patient tolerated treatment well Patient left: in chair;with call bell/phone within reach   Time: 1009-1039 OT Time Calculation (min): 30 min Charges:  OT General Charges $OT Visit: 1 Procedure OT Evaluation $Initial OT Evaluation Tier I: 1 Procedure Almon Register [phone] 06/14/2014, 12:18 PM

## 2014-06-14 NOTE — Care Management Note (Signed)
    Page 1 of 2   06/14/2014     1:21:01 PM CARE MANAGEMENT NOTE 06/14/2014  Patient:  [name], [name]   Account Number:  [id number]  Date Initiated:  06/14/2014  Documentation initiated by:  [facility]  Subjective/Objective Assessment:   adm:s/p L4-5 PLIF     Action/Plan:   discharge planning   Anticipated DC Date:  06/14/2014   Anticipated DC Plan:  [place]  CM consult      [facility] Choice  HOME HEALTH   Choice offered to / List presented to:  C-1 Patient   DME arranged  3-N-1      DME agency  Joanna arranged  [place].   Status of service:  Completed, signed off Medicare Important Message given?   (If response is "NO", the following Medicare IM given date fields will be blank) Date Medicare IM given:   Medicare IM given by:   Date Additional Medicare IM given:   Additional Medicare IM given by:    Discharge Disposition:  [place]  Per UR Regulation:    If discussed at Long Length of Stay Meetings, dates discussed:    Comments:  06/14/14 11:15 CM met with pt in room to offer choice of home health agency.  Pt chooses [facility] to render HHPT/OT/aide. Address and contact is wife, Freida Busman [phone].  CM faxed the order, F2F, facesheet with Candy's number, H&P, OP note to [phone].  CM  called Ty [phone], [employer] rep to please deliver 3n1 to room prior to discharge.  Scarlette Slice requests I call the main number [phone]  to place order; Casilda Carls of [employer] states they received the fax and order has been placed in the correct queue to be delivered later today.  CM called [facility] rep, Lecretia with referral for HHPT/OT/Aide.  No other CM needs were communicated.  Mariane Masters, BSn, CM [phone].

## 2014-06-15 NOTE — Progress Notes (Addendum)
Occupational Therapy Treatment Patient Details Name: CHEICK SUHR MRN: [id number] DOB: 1950/11/24 Today's Date: 06/15/2014    History of present illness 64 year old male with severe Parkinson's disease who is status post previous L4-5 decompressive surgery. Patient suffered a fall with marked increase in his back pain. 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 Patient failed conservative management. He had severe back and bilateral leg pain with standing and walking. Patient also has critical stenosis at L1-L2, all which were addressed during decompression and fusion surgery.   OT comments  Pt unsteady today requiring Min A for balance in session. Wife reports she thinks pt may be affected by pain meds today. Education provided in session.  Follow Up Recommendations  No OT follow up;Supervision/Assistance - 24 hour    Equipment Recommendations  None recommended by OT    Recommendations for Other Services      Precautions / Restrictions Precautions Precautions: Back Precaution Booklet Issued: No Precaution Comments: reviewed back precautions and gave cues in session; pt able to state 1/3 Required Braces or Orthoses: Spinal Brace Spinal Brace: Applied in sitting position Restrictions Weight Bearing Restrictions: No       Mobility Bed Mobility Overal bed mobility: Needs Assistance Bed Mobility: Rolling;Sit to Sidelying;Supine to Sit Rolling: Min assist   Supine to sit: HOB elevated;Min assist   Sit to sidelying: Min guard General bed mobility comments: Pt in poor position in bed when OT arrived and appeared to be getting up. Educated on log roll technique when returning to bed. [place] and +2 assist used to scoot pt HOB.   Transfers Overall transfer level: Needs assistance Equipment used: Rolling walker (2 wheeled) Transfers: Sit to/from Stand Sit to Stand: Min assist         General  transfer comment: cues for technique.     Balance Overall balance assessment: Needs assistance      Min A for balance as pt performed clothing management/toileting at elevated commode (stood to try to urinate)  Min A for balance sitting EOB during functional task  Min A for mobility with RW                           ADL Overall ADL's : Needs assistance/impaired     Grooming: Wash/dry hands;Min guard;Standing           Upper Body Dressing : Sitting;Moderate assistance (assist with brace and balance)   Lower Body Dressing: Sitting/lateral leans;Minimal assistance;With adaptive equipment (socks; assist with balance sitting EOB)   Toilet Transfer: Minimal assistance;Ambulation;RW;Comfort height toilet;Grab bars   Toileting- Clothing Manipulation and Hygiene: Minimal assistance (standing; assist with balance)       Functional mobility during ADLs: Minimal assistance;Rolling walker General ADL Comments: Educated on tub transfer techniques as well as alternative option for shower chair. Explained similarity of tub transfer of backing to flat chair and swinging legs in is for car transfer. Educated on safety such as rugs and explained use of bag on walker. Educated on sockaid and pt practiced. Educated on use of cup for oral care and placement of grooming items to avoid breaking precautions. Educated on back brace. Pt states that his adaptive fork worked well for him. Assist for balance in session.      Vision                     Perception     Praxis  Cognition  Awake/Alert Behavior During Therapy: WFL for tasks assessed/performed Overall Cognitive Status: Impaired/Different from baseline Area of Impairment: Problem solving              Problem Solving: Slow processing (suspect due to pain meds)      Extremity/Trunk Assessment               Exercises     Shoulder Instructions       General Comments      Pertinent Vitals/  Pain       Pain Assessment: 0-10 Pain Score:  (27) Pain Location: back when moving Pain Intervention(s): Repositioned;Monitored during session; notified nurse  Home Living                                          Prior Functioning/Environment              Frequency Min 2X/week     Progress Toward Goals  OT Goals(current goals can now be found in the care plan section)  Progress towards OT goals: Progressing toward goals  Acute Rehab OT Goals Patient Stated Goal: not stated OT Goal Formulation: With patient/family Time For Goal Achievement: 06/14/14 Potential to Achieve Goals: Good ADL Goals Pt Will Perform Eating: with modified independence;with adaptive utensils;sitting Pt Will Perform Grooming: with set-up;standing;with supervision Pt Will Perform Lower Body Dressing: with set-up;with supervision;sit to/from stand Pt Will Transfer to Toilet: with supervision;ambulating;grab bars (comfort height toilet) Pt Will Perform Toileting - Clothing Manipulation and hygiene: with supervision;sit to/from stand Pt Will Perform Tub/Shower Transfer: Tub transfer;with supervision;ambulating;rolling walker;shower seat  Plan Discharge plan needs to be updated    Co-evaluation                 End of Session Equipment Utilized During Treatment: Rolling walker;Back brace;Gait belt   Activity Tolerance Patient limited by pain   Patient Left in bed;with call bell/phone within reach;with bed alarm set;with SCD's reapplied;with family/visitor present   Nurse Communication Other (comment) (pain level)        Time: 7654-6503 OT Time Calculation (min): 31 min  Charges: OT General Charges $OT Visit: 1 Procedure OT Treatments $Self Care/Home Management : 23-37 mins    Benito Mccreedy OTR/L [phone] 06/15/2014, 9:54 AM

## 2014-06-15 NOTE — Progress Notes (Signed)
Subjective: Patient reports having harder day today than yesterday with more unsteadiness  Objective: Vital signs in last 24 hours: Temp:  [97.8 F (36.6 C)-98.6 F (37 C)] 98.2 F (36.8 C) (02/28 0706) Pulse Rate:  [52-56] 52 (02/28 0706) Resp:  [16-18] 18 (02/28 0706) BP: (93-119)/(52-70) 100/60 mmHg (02/28 0706) SpO2:  [94 %-98 %] 94 % (02/28 0706)  Intake/Output from previous day: 02/27 0701 - 02/28 0700 In: 350 [P.O.:350] Out: 100 [Drains:100] Intake/Output this shift:    Physical Exam: Strength good.  Up in brace.  Requiring belt for balance.  Parkinsonism affecting mobility.  Working with therapists.  Lab Results: No results for input(s): WBC, HGB, HCT, PLT in the last 72 hours. BMET No results for input(s): NA, K, CL, CO2, GLUCOSE, BUN, CREATININE, CALCIUM in the last 72 hours.  Studies/Results: Dg Lumbar Spine 2-3 Views  06/13/2014   CLINICAL DATA:  Post lumbar fusion L4-L5 level, laminectomy  EXAM: DG C-ARM 61-120 MIN; LUMBAR SPINE - 2-3 VIEW  TECHNIQUE: Two portable views of the lumbar spine submitted.  CONTRAST:  None  FLUOROSCOPY TIME:  If the device does not provide the exposure index:  Fluoroscopy Time (in minutes and seconds):  17 seconds  Number of Acquired Images:  2  COMPARISON:  05/19/2014  FINDINGS: Two views of the lumbar spine submitted. 2 portable views of the lumbar spine submitted. Metallic fixation screws with posterior approach noted L4 and L5 vertebral bodies. Intervertebral postoperative disc material noted at L4-L5 level. There is anatomic alignment.  IMPRESSION: Posterior metallic fixation screws are noted L4 and L5 level with anatomic alignment.   Electronically Signed   By: Lahoma Crocker M.D.   On: 06/13/2014 11:31   Dg C-arm 1-60 Min  06/13/2014   CLINICAL DATA:  Post lumbar fusion L4-L5 level, laminectomy  EXAM: DG C-ARM 61-120 MIN; LUMBAR SPINE - 2-3 VIEW  TECHNIQUE: Two portable views of the lumbar spine submitted.  CONTRAST:  None  FLUOROSCOPY  TIME:  If the device does not provide the exposure index:  Fluoroscopy Time (in minutes and seconds):  17 seconds  Number of Acquired Images:  2  COMPARISON:  05/19/2014  FINDINGS: Two views of the lumbar spine submitted. 2 portable views of the lumbar spine submitted. Metallic fixation screws with posterior approach noted L4 and L5 vertebral bodies. Intervertebral postoperative disc material noted at L4-L5 level. There is anatomic alignment.  IMPRESSION: Posterior metallic fixation screws are noted L4 and L5 level with anatomic alignment.   Electronically Signed   By: Lahoma Crocker M.D.   On: 06/13/2014 11:31    Assessment/Plan: Continue to work on mobility today.  Possible D/C in am if continues to progress.    LOS: 2 days    [name], MD 06/15/2014, 9:27 AM

## 2014-06-15 NOTE — Progress Notes (Signed)
Pt alert, verbal safety measures in place as pt sits up in a chair watching television. Call bell within reach. Wife at bedside. Will continue to monitor.

## 2014-06-15 NOTE — Progress Notes (Signed)
Pt noted ambulating this afternoon with therapist with rolling walker in hallway slow and drowsy. Pt was asked if he was in pain and  stated, " I feel good." Prior to receiving pain medication, pt stated that some times he gets drowsy on pain medication and after taking pain medication  Reports his pain 5/10 stating," I get some relief but it doesn't take the pain completely away." Pt back in his room at this time eating his dinner. Safety measures in place. Call bell within reach. His wife remains at bedside and assists with pt's care. Will continue to monitor.

## 2014-06-15 NOTE — Progress Notes (Addendum)
Physical Therapy Treatment Patient Details Name: [name] MRN: [id number] DOB: 1950-06-01 Today's Date: 06/15/2014    History of Present Illness 64 year old male with severe Parkinson's disease who is status post previous L4-5 decompressive surgery. Patient suffered a fall with marked increase in his back pain. 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 Patient failed conservative management. He had severe back and bilateral leg pain with standing and walking. Patient also has critical stenosis at L1-L2, all which were addressed during decompression and fusion surgery.    PT Comments    Pt not progressing with his mobility, but he is very slow to process information and commands. RN reports his pain medications have been making him this way when he takes them.  Increased assist needed for bed mobility and pt was unable to progress gait distance with RW due to increasing knee instability the further we went during gait.  Wife found me in the hallway and reports she is unable to care for him at this level of assistance (she herself has a very bad back) and would like to pursue SNF level rehab at [facility] in [place] for rehab prior to him returning home with her.   Follow Up Recommendations  SNF     Equipment Recommendations  None recommended by PT    Recommendations for Other Services       Precautions / Restrictions Precautions Precautions: Back Precaution Comments: Verbally reviewed back precuations, lifting restrictions, walking 3 times per day, and no sitting for >30-45 mins at a time.  Required Braces or Orthoses: Spinal Brace Spinal Brace: Applied in sitting position    Mobility  Bed Mobility Overal bed mobility: Needs Assistance Bed Mobility: Rolling;Sidelying to Sit Rolling: Min assist Sidelying to sit: Mod assist       General bed mobility comments: Min assist to log roll to the  right. Max verbal cues for pt to remember technique and for hand placement.  Mod assist to support trunk during transition to get to sitting with HOB flat and pt using railing for leverage.   Transfers Overall transfer level: Needs assistance Equipment used: Rolling walker (2 wheeled) Transfers: Sit to/from Stand Sit to Stand: Min assist;From elevated surface         General transfer comment: Min assist to support trunk over very flexed knees with hands on RW.  Pt needed cues for knee and hip extension and to use hands to support himself in standing on RW.  Ambulation/Gait Ambulation/Gait assistance: Min assist Ambulation Distance (Feet): 65 Feet Assistive device: Rolling walker (2 wheeled) Gait Pattern/deviations: Step-through pattern;Shuffle Gait velocity: decreased Gait velocity interpretation: <1.8 ft/sec, indicative of risk for recurrent falls General Gait Details: Pt with significant decrease in gait speed.  Bil flexed knee posture during gait.  He needs assist to steer RW and to support trunk for stability over weak legs.           Balance Overall balance assessment: Needs assistance Sitting-balance support: Feet supported;Bilateral upper extremity supported Sitting balance-Leahy Scale: Poor Sitting balance - Comments: Pt needs min assist in sitting today to maintain balance and prevent R side LOB.  Postural control: Right lateral lean Standing balance support: Bilateral upper extremity supported Standing balance-Leahy Scale: Poor Standing balance comment: Pt needs hands on support at all times in standing.                     Cognition Arousal/Alertness: Lethargic;Suspect due to medications Behavior  During Therapy: Flat affect Overall Cognitive Status: Impaired/Different from baseline Area of Impairment: Problem solving;Safety/judgement;Awareness;Following commands;Attention   Current Attention Level: Sustained Memory: Decreased recall of  precautions Following Commands: Follows one step commands with increased time Safety/Judgement: Decreased awareness of safety;Decreased awareness of deficits Awareness: Emergent Problem Solving: Slow processing (suspect due to pain meds)             Pertinent Vitals/Pain Pain Assessment: Faces Faces Pain Scale: Hurts little more Pain Location: back Pain Descriptors / Indicators: Aching Pain Intervention(s): Limited activity within patient's tolerance;Monitored during session;Premedicated before session;Repositioned           PT Goals (current goals can now be found in the care plan section) Acute Rehab PT Goals Patient Stated Goal: none stated Progress towards PT goals: Not progressing toward goals - comment (increased lethargy today)    Frequency  Min 5X/week    PT Plan Discharge plan needs to be updated       End of Session Equipment Utilized During Treatment: Back brace Activity Tolerance: Patient limited by lethargy Patient left: in chair;with call bell/phone within reach;with family/visitor present     Time: 2836-6294 PT Time Calculation (min) (ACUTE ONLY): 15 min  Charges:  $Gait Training: 8-22 mins                     Kaisey Huseby B. Jayelyn Barno, [place], DPT [phone]   06/15/2014, 5:59 PM

## 2014-06-15 NOTE — Progress Notes (Signed)
Pt ambulated with rolling walker brace on and aligned to the bathroom and up in the chair. Wife at bedside. No noted distress. He denies pain or discomfort at this time. Hemovac removed, emptied 46ml dry dressing applied. Surgical sited dressing changed, old scant drainage applied new dressing clean dry and intact. Will continue to monitor.

## 2014-06-16 NOTE — Progress Notes (Signed)
Occupational Therapy Treatment Patient Details Name: [name] MRN: [id number] DOB: June 24, 1950 Today's Date: 06/16/2014    History of present illness 64 year old male with severe Parkinson's disease who is status post previous L4-5 decompressive surgery. Patient suffered a fall with marked increase in his back pain. 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 Patient failed conservative management. He had severe back and bilateral leg pain with standing and walking. Patient also has critical stenosis at L1-L2, all which were addressed during decompression and fusion surgery.   OT comments  Pt. With limited participation this session secondary to reports from wife that he is still heavily sedated from pain meds.  Pt. And spouse decline mobility or oob at this time.  Provided adaptive bendable spoon for increased independence with self feeding.  Assisted wife with adjusting bend for R hand dominance.  Wife continues to state d/c plans will be for [facility] for continued therapies in preparation for her to take care of him at home.  Will notify OTR/L for d/c plan changes.    Follow Up Recommendations  SNF    Equipment Recommendations  None recommended by OT    Recommendations for Other Services      Precautions / Restrictions Precautions Precautions: Back Required Braces or Orthoses: Spinal Brace Spinal Brace: Applied in sitting position Restrictions Weight Bearing Restrictions: No       Mobility Bed Mobility                  Transfers                      Balance                                   ADL Overall ADL's : Needs assistance/impaired   Eating/Feeding Details (indicate cue type and reason): pt. given adaptive bendable fork, spoon provided today.  assisted wife with securing a bend for R hand dominance                                     General ADL Comments: pt. and wife decline further skilled therapy at this time secondary to pt. remains sedated and very lethargic and they do not feel mobility would be "safe" right now.  will attempt to have someone come back later if able.  also discussed pt.and wife's wishes to know got short term snf close to home for further therapy so she can safely care for him at home.  will update d/c plans accordingly      Vision                     Perception     Praxis      Cognition   Behavior During Therapy: Flat affect                         Extremity/Trunk Assessment               Exercises     Shoulder Instructions       General Comments      Pertinent Vitals/ Pain       Pain Assessment: No/denies pain  Home Living  Prior Functioning/Environment              Frequency Min 2X/week     Progress Toward Goals  OT Goals(current goals can now be found in the care plan section)  Progress towards OT goals: Not progressing toward goals - comment (mobility and participation greatly affected by meds )     Plan Discharge plan needs to be updated    Co-evaluation                 End of Session     Activity Tolerance Patient limited by lethargy   Patient Left     Nurse Communication          Time: 1000-1010 OT Time Calculation (min): 10 min  Charges: OT General Charges $OT Visit: 1 Procedure OT Treatments $Self Care/Home Management : 8-22 mins  Janice Coffin, COTA/L 06/16/2014, 10:19 AM

## 2014-06-16 NOTE — Progress Notes (Signed)
Postop day 3. Patient somewhat more sore and sedated today. Patient and his wife note difficulty with unsteadiness on his feet. This is complicating possible safe discharge home.  Afebrile. Vital stable. Urine output good. Awake and aware. Oriented and appropriate but patient speech slurred somewhat. Motor and sensory exam stable. Wound clean and dry.  The patient's situation is compensated by his severe Parkinson's disease. I wonder if he may benefit from a brief course of inpatient rehabilitation prior to discharge home. I will have him evaluated by our rehabilitation medicine service today.

## 2014-06-16 NOTE — Clinical Social Work Note (Signed)
The pt has a bed at [facility]. Once medically stable the pt can transition. A 48 hour notice is requested to start insurance authorization before discharge. CSW informed MD regarding bed offer.   [place], MSW, [place]

## 2014-06-16 NOTE — Progress Notes (Signed)
Physical medicine and rehabilitation consult requested with chart reviewed. Physical therapy evaluation completed 06/14/2014 patient doing very well ambulating 170 feet with minimal guarding and recommendations have been made for outpatient therapies. At this time patient will not require inpatient rehabilitation services. Hold on formal rehabilitation consult at this time with recommendations discharged to home

## 2014-06-16 NOTE — Progress Notes (Signed)
Physical Therapy Treatment Patient Details Name: [name] MRN: [id number] DOB: 19-Nov-1950 Today's Date: 06/16/2014    History of Present Illness 64 year old male with severe Parkinson's disease who is status post previous L4-5 decompressive surgery. Patient suffered a fall with marked increase in his back pain. 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 Patient failed conservative management. He had severe back and bilateral leg pain with standing and walking. Patient also has critical stenosis at L1-L2, all which were addressed during decompression and fusion surgery.    PT Comments    Pt with improved mobility from yesterday however con't to require assist for all transfers and amb. Spouse unable to physically assist pt. Feel pt to still benefit from SNF upon d/c to maximize functional return for safe transition home.   Follow Up Recommendations  SNF     Equipment Recommendations  None recommended by PT    Recommendations for Other Services       Precautions / Restrictions Precautions Precautions: Back Precaution Comments: Verbally reviewed back precuations, lifting restrictions, walking 3 times per day, and no sitting for >30-45 mins at a time.  Required Braces or Orthoses: Spinal Brace Spinal Brace: Applied in sitting position Restrictions Weight Bearing Restrictions: No    Mobility  Bed Mobility Overal bed mobility: Needs Assistance Bed Mobility: Rolling;Sidelying to Sit Rolling: Min assist Sidelying to sit: Mod assist     Sit to sidelying: Mod assist General bed mobility comments: max directional v/c's for technique and to stay on task  Transfers Overall transfer level: Needs assistance Equipment used: Rolling walker (2 wheeled) Transfers: Sit to/from Stand Sit to Stand: Min assist;From elevated surface         General transfer comment: Min assist to support trunk over very  flexed knees with hands on RW.  Pt needed cues for knee and hip extension and to use hands to support himself in standing on RW.  Ambulation/Gait Ambulation/Gait assistance: Min assist Ambulation Distance (Feet): 150 Feet Assistive device: Rolling walker (2 wheeled) Gait Pattern/deviations: Step-through pattern Gait velocity: decreased Gait velocity interpretation: <1.8 ft/sec, indicative of risk for recurrent falls General Gait Details: pt with narrow base of support and shuffling gait pattern. max v/c's to achieve upright posture both knee and trunk extension. pt with freq rest breaks and required tactile cues at hips to extend. MinA for walker management around obstacles.   Stairs            Wheelchair Mobility    Modified Rankin (Stroke Patients Only)       Balance Overall balance assessment: Needs assistance Sitting-balance support: Feet supported Sitting balance-Leahy Scale: Poor Sitting balance - Comments: Pt needs min assist in sitting today to maintain balance and prevent R side LOB.    Standing balance support: Bilateral upper extremity supported Standing balance-Leahy Scale: Poor Standing balance comment: pt did urinate in standing however required steading assist and max v/c's to maintain knee extension                    Cognition Arousal/Alertness: Lethargic;Suspect due to medications Behavior During Therapy: Flat affect Overall Cognitive Status: Impaired/Different from baseline Area of Impairment: Problem solving;Safety/judgement;Awareness;Following commands;Attention   Current Attention Level: Sustained Memory: Decreased recall of precautions Following Commands: Follows one step commands with increased time Safety/Judgement: Decreased awareness of safety;Decreased awareness of deficits Awareness: Emergent Problem Solving: Slow processing;Requires verbal cues;Requires tactile cues General Comments: v/c's to manage walker around obstacles  Exercises      General Comments        Pertinent Vitals/Pain Pain Assessment: No/denies pain Pain Score: 3  Pain Location: back Pain Descriptors / Indicators: Aching Pain Intervention(s): Limited activity within patient's tolerance    Home Living                      Prior Function            PT Goals (current goals can now be found in the care plan section) Acute Rehab PT Goals Patient Stated Goal: none stated Progress towards PT goals: Progressing toward goals    Frequency  Min 5X/week    PT Plan Discharge plan needs to be updated    Co-evaluation             End of Session Equipment Utilized During Treatment: Back brace Activity Tolerance: Patient limited by lethargy Patient left: in bed;with call bell/phone within reach;with bed alarm set     Time: 3013-1438 PT Time Calculation (min) (ACUTE ONLY): 36 min  Charges:  $Gait Training: 8-22 mins $Therapeutic Activity: 8-22 mins                    G Codes:      Kingsley Callander 06/16/2014, 11:50 AM   Kittie Plater, PT, DPT Pager #: [phone] Office #: [phone]

## 2014-06-16 NOTE — Clinical Social Work Psychosocial (Signed)
Clinical Social Work Department BRIEF PSYCHOSOCIAL ASSESSMENT 06/16/2014  Patient:  [name], [name]     Account Number:  [id number]     Admit date:  06/13/2014  Clinical Social Worker:  Marciano Sequin  Date/Time:  06/16/2014 11:18 AM  Referred by:  RN  Date Referred:  06/16/2014 Referred for  SNF Placement   Other Referral:   Interview type:  Patient Other interview type:    PSYCHOSOCIAL DATA Living Status:  WIFE Admitted from facility:   Level of care:   Primary support name:  [name] Primary support relationship to patient:  SPOUSE Degree of support available:   Strong System    CURRENT CONCERNS Current Concerns  None Noted   Other Concerns:    SOCIAL WORK ASSESSMENT / PLAN CSW met the pt and pt's wife [name] at the bedside.  CSW introduced self and purpose of the visit. CSW discussed clinical recommendation for SNF rehab. CSW inquired about the geographical location in which the pt would like to receive rehab from. [name] expressed interested in [place]. CSW explained the SNF rehab process to the pt. CSW and pt discussed insurance and its relation to SNF rehab. [name] expressed interested in wanting the pt to transition to [facility]. CSW answered all questions in which the pt inquired about. CSW provided pt with contact information for further questions. CSW will continue to follow this pt and assist with discharge as needed.   Assessment/plan status:  Psychosocial Support/Ongoing Assessment of Needs Other assessment/ plan:   Information/referral to community resources:    PATIENT'S/FAMILY'S RESPONSE TO CURRENT DIAGNOSE: The pt presented with a normal affect and calm mood. Pt reported having some pain in his back. Pt appeared to be indifferent regarding his current surgery.    PATIENT'S/FAMILY'S RESPONSE TO PLAN OF CARE: Pt and pt's wife was receptive to discussing SNF rehab. Pt and wife reported understanding the important/benefits of receving rehab  before transition home.   [place], MSW, [place]

## 2014-06-16 NOTE — Progress Notes (Signed)
UR completed 

## 2014-06-16 NOTE — Clinical Social Work Placement (Addendum)
Clinical Social Work Department CLINICAL SOCIAL WORK PLACEMENT NOTE 06/16/2014  Patient:  AYYAN, SITES  Account Number:  [id number] Admit date:  06/13/2014  Clinical Social Worker:  Paulette Blanch [place] M[place], LCSWA  Date/time:  06/16/2014 11:25 AM  Clinical Social Work is seeking post-discharge placement for this patient at the following level of care:   SKILLED NURSING   (*CSW will update this form in Epic as items are completed)   06/16/2014  Patient/family provided with [facility] Department of Clinical Social Work's list of facilities offering this level of care within the geographic area requested by the patient (or if unable, by the patient's family).  06/16/2014  Patient/family informed of their freedom to choose among providers that offer the needed level of care, that participate in Medicare, Medicaid or managed care program needed by the patient, have an available bed and are willing to accept the patient.  06/16/2014  Patient/family informed of [facility]' ownership interest in [facility], as well as of the fact that they are under no obligation to receive care at this facility.  PASARR submitted to EDS on 06/16/2014 PASARR number received on   FL2 transmitted to all facilities in geographic area requested by pt/family on  06/16/2014 FL2 transmitted to all facilities within larger geographic area on 06/16/2014  Patient informed that his/her managed care company has contracts with or will negotiate with  certain facilities, including the following:     Patient/family informed of bed offers received:  06/16/2014 Patient chooses bed at [facility]  Physician recommends and patient chooses bed at    Patient to be transferred to [facility] on  06/17/2014 Patient to be transferred to facility by [facility]  Patient and family notified of transfer on 06/17/2014 Name of family member notified:  Lottie Rater   The following physician request were entered in  Epic:   Additional Comments:  [place], MSW, [place]

## 2014-06-17 ENCOUNTER — Encounter
Admit: 2014-06-17 | Disposition: A | Discharge status: Home / Self Care | Payer: Self-pay | Attending: Internal Medicine | Admitting: Internal Medicine

## 2014-06-17 MED ORDER — HYDROCODONE-ACETAMINOPHEN 5-325 MG PO TABS: 1.0000 | ORAL_TABLET | ORAL | Status: DC | PRN

## 2014-06-17 MED ORDER — METHOCARBAMOL 500 MG PO TABS: 500.0000 mg | ORAL_TABLET | Freq: Four times a day (QID) | ORAL | Status: DC | PRN

## 2014-06-17 MED FILL — Sodium Chloride IV Soln 0.9%: INTRAVENOUS | Qty: 1000 | Status: AC

## 2014-06-17 MED FILL — Heparin Sodium (Porcine) Inj 1000 Unit/ML: INTRAMUSCULAR | Qty: 30 | Status: AC

## 2014-06-17 MED FILL — Sodium Chloride Irrigation Soln 0.9%: Qty: 3000 | Status: AC

## 2014-06-17 NOTE — Progress Notes (Signed)
Physical Therapy Treatment Patient Details Name: [name] MRN: [id number] DOB: 27-Mar-1951 Today's Date: 06/17/2014    History of Present Illness 64 year old male with severe Parkinson's disease who is status post previous L4-5 decompressive surgery. Patient suffered a fall with marked increase in his back pain. 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 Patient failed conservative management. He had severe back and bilateral leg pain with standing and walking. Patient also has critical stenosis at L1-L2, all which were addressed during decompression and fusion surgery.    PT Comments    Patient limited this session and unable to ambulate as well this session. Patient appeared drowsy and required cues and increased time to stay focused on task. Patient stated, "they doped me up last night.". Patient repositioned in the recliner after lying sideways in the bed. Patient incontinent in bed and stated that assistance did not arrive in time. Patient educated on not getting up without assistance. Continue to recommend SNF for ongoing Physical Therapy.     Follow Up Recommendations  SNF     Equipment Recommendations  None recommended by PT    Recommendations for Other Services       Precautions / Restrictions Precautions Precautions: Back Precaution Comments: Patient unable to recall this session Required Braces or Orthoses: Spinal Brace Spinal Brace: Applied in sitting position    Mobility  Bed Mobility Overal bed mobility: Needs Assistance   Rolling: Min assist Sidelying to sit: Mod assist       General bed mobility comments: max directional v/c's for technique and to stay on task and Mod A to facilitate upright sitting posture and elevate trunk into sitting. Patient required increased time.   Transfers Overall transfer level: Needs assistance Equipment used: Rolling walker (2 wheeled)   Sit to  Stand: Mod assist         General transfer comment: Mod A to power up into standing this session. Patient with heavy posterior lean and bracing LEs on back of bed/chair. Patient with excessive forward flexion with stand needing cues to correct. VC and TC for hand placement.   Ambulation/Gait Ambulation/Gait assistance: Min assist Ambulation Distance (Feet): 10 Feet Assistive device: Rolling walker (2 wheeled) Gait Pattern/deviations: Step-through pattern;Decreased stride length Gait velocity: decreased   General Gait Details: pt with narrow base of support and shuffling gait pattern. max v/c's to achieve upright posture both knee and trunk extension. Unable to walk further this session due to lethargy. Patient unable to keep full focus and keep eyes open   Stairs            Wheelchair Mobility    Modified Rankin (Stroke Patients Only)       Balance     Sitting balance-Leahy Scale: Poor Sitting balance - Comments: Pt needs min assist in sitting today to maintain balance and prevent R side LOB.                             Cognition Arousal/Alertness: Lethargic;Suspect due to medications Behavior During Therapy: Flat affect Overall Cognitive Status: Impaired/Different from baseline Area of Impairment: Problem solving;Safety/judgement;Awareness;Following commands;Attention   Current Attention Level: Sustained Memory: Decreased recall of precautions Following Commands: Follows one step commands with increased time Safety/Judgement: Decreased awareness of safety;Decreased awareness of deficits   Problem Solving: Slow processing;Requires verbal cues;Requires tactile cues General Comments: Patient appears with increased drowsiness. Required increased VCs and tactiles cues for following  through cues this session    Exercises      General Comments        Pertinent Vitals/Pain Faces Pain Scale: Hurts little more Pain Location: back Pain Descriptors /  Indicators: Aching Pain Intervention(s): Monitored during session;Limited activity within patient's tolerance    Home Living                      Prior Function            PT Goals (current goals can now be found in the care plan section) Progress towards PT goals: Not progressing toward goals - comment    Frequency  Min 5X/week    PT Plan Current plan remains appropriate    Co-evaluation             End of Session Equipment Utilized During Treatment: Back brace Activity Tolerance: Patient limited by lethargy Patient left: in chair;with call bell/phone within reach;with chair alarm set     Time: [id number] PT Time Calculation (min) (ACUTE ONLY): 26 min  Charges:  $Gait Training: 8-22 mins $Therapeutic Activity: 8-22 mins                    G Codes:      Jacqualyn Posey 06/17/2014, 8:33 AM 06/17/2014 Jacqualyn Posey PTA [phone] pager [phone] office

## 2014-06-17 NOTE — Discharge Instructions (Signed)
Wound Care °Keep incision covered and dry for two days.  If you shower, cover incision with plastic wrap.  °Do not put any creams, lotions, or ointments on incision. °Leave steri-strips on back.  They will fall off by themselves. °Activity °Walk each and every day, increasing distance each day. °No lifting greater than 5 lbs.  Avoid excessive neck motion. °No driving for 2 weeks; may ride as a passenger locally. °If provided with back brace, wear when out of bed.  It is not necessary to wear brace in bed. °Diet °Resume your normal diet.  °Return to Work °Will be discussed at you follow up appointment. °Call Your Doctor If Any of These Occur °Redness, drainage, or swelling at the wound.  °Temperature greater than 101 degrees. °Severe pain not relieved by pain medication. °Incision starts to come apart. °Follow Up Appt °Call today for appointment in 1-2 weeks ([phone]) or for problems.  If you have any hardware placed in your spine, you will need an x-ray before your appointment. ° ° °Spinal Fusion °Spinal fusion is a procedure to make 2 or more of the bones in your spinal column (vertebrae) grow together (fuse). This procedure stops movement between the vertebrae and can relieve pain and prevent deformity.  °Spinal fusion is used to treat the following conditions: °· Fractures of the spine. °· Herniated disk (the spongy material [cartilage] between the vertebrae). °· Abnormal curvatures of the spine, such as scoliosis or kyphosis. °· A weak or an unstable spine, caused by infections or tumor. °RISKS AND COMPLICATIONS °Complications associated with spinal fusion are rare, but they can occur. Possible complications include: °· Bleeding. °· Infection near the incision. °· Nerve damage. Signs of nerve damage are back pain, pain in one or both legs, weakness, or numbness. °· Spinal fluid leakage. °· Blood clot in your leg, which can move to your lungs. °· Difficulty controlling urination or bowel movements. °BEFORE THE  PROCEDURE °· A medical evaluation will be done. This will include a physical exam, blood tests, and imaging exams. °· You will talk with an anesthesiologist. This is the person who will be in charge of the anesthesia during the procedure. Spinal fusion usually requires that you are asleep during the procedure (general anesthesia). °· You will need to stop taking certain medicines, particularly those associated with an increased risk of bleeding. Ask your caregiver about changing or stopping your regular medicines. °· If you smoke, you will need to stop at least 2 weeks before the procedure. Smoking can slow down the healing process, especially fusion of the vertebrae, and increase the risk of complications. °· Do not eat or drink anything for at least 8 hours before the procedure. °PROCEDURE  °A cut (incision) is made over the vertebrae that will be fused. The back muscles are separated from the vertebrae. If you are having this procedure to treat a herniated disk, the disc material pressing on the nerve root is removed (decompression). The area where the disk is removed is then filled with extra bone. Bone from another part of your body (autogenous bone) or bone from a bone donor (allograft bone) may be used. The extra bone promotes fusion between the vertebrae. Sometimes, specific medicines are added to the fusion area to promote bone healing. In most cases, screws and rods or metal plates will be used to attach the vertebrae to stabilize them while they fuse.  °AFTER THE PROCEDURE  °· You will stay in a recovery area until the anesthesia has worn   off. Your blood pressure and pulse will be checked frequently. °· You will be given antibiotics to prevent infection. °· You may continue to receive fluids through an intravenous (IV) tube while you are still in the hospital. °· Pain after surgery is normal. You will be given pain medicine. °· You will be taught how to move correctly and how to stand and walk. While in  bed, you will be instructed to turn frequently, using a "log rolling" technique, in which the entire body is moved without twisting the back. °Document Released: 01/01/2003 Document Revised: 06/27/2011 Document Reviewed: 06/17/2010 °ExitCare® Patient Information ©2015 [employer]. This information is not intended to replace advice given to you by your health care provider. Make sure you discuss any questions you have with your health care provider. ° °

## 2014-06-17 NOTE — Progress Notes (Signed)
Patient discharged via ambulance transportation. Neuro assessment unchanged. Prescription and discharge envelope given to transportation.

## 2014-06-17 NOTE — Discharge Summary (Signed)
Physician Discharge Summary  Patient ID: [name] MRN: [id number] DOB/AGE: 65/01/1951 65 y.o.  Admit date: 06/13/2014 Discharge date: 06/17/2014  Admission Diagnoses:  Discharge Diagnoses:  Principal Problem:   Spinal stenosis, lumbar region, with neurogenic claudication Active Problems:   Lumbar stenosis with neurogenic claudication   Discharged Condition: good  Hospital Course: The patient was admitted to the hospital where he underwent uncomplicated L1 to decompressive laminectomy and L4-5 decompression and fusion for treatment of his lumbar stenosis and traumatic spondylolysis. Patient's postoperative course has been good but his underlying Parkinson's disease has limited his situation and therapy. The patient still requires significant assistance with mobilization and his wife's health does not allow her to participate in this. Plan is for short-term skilled nursing facility lace that during convalescence. The patient is agreeable to this plan. Overall his back and lower extremity symptoms are improved from preop and he is happy with his progress.  Consults:   Significant Diagnostic Studies:   Treatments:   Discharge Exam: Blood pressure 136/69, pulse 68, temperature 98 F (36.7 C), temperature source Oral, resp. rate 18, height 5\' 9"  (1.753 m), weight 86.365 kg (190 lb 6.4 oz), SpO2 96 %. Patient is awake and alert. He is oriented and appropriate. His motor examination is intact. Sensory examination is nonfocal. Wounds are healing well. Chest and abdomen are benign.  Disposition: 01-Home or Self Care     Medication List    TAKE these medications        BLINK TEARS 0.25 % Gel  Generic drug:  Polyethylene Glycol 400  Apply 1 drop to eye as needed (for dry eyes).     buPROPion 300 MG 24 hr tablet  Commonly known as:  WELLBUTRIN XL  Take 1 tablet (300 mg total) by mouth daily.     carbidopa-levodopa 25-100 MG per tablet  Commonly known as:  SINEMET IR  Take 1-2  tablets by mouth 2 (two) times daily. Take 2 tablets by mouth in the morning, 1 tablets in the afternoon and take 1 tablet by mouth in the evening.     cholecalciferol 1000 UNITS tablet  Commonly known as:  VITAMIN D  Take 1,000 Units by mouth daily.     fluticasone 50 MCG/ACT nasal spray  Commonly known as:  FLONASE  Place 2 sprays into both nostrils daily as needed for allergies.     gabapentin 300 MG capsule  Commonly known as:  NEURONTIN  Take 1 capsule (300 mg total) by mouth 3 (three) times daily.     hydrochlorothiazide 25 MG tablet  Commonly known as:  HYDRODIURIL  Take 1 tablet (25 mg total) by mouth daily.     HYDROcodone-acetaminophen 5-325 MG per tablet  Commonly known as:  NORCO/VICODIN  Take 1-2 tablets by mouth every 4 (four) hours as needed for moderate pain.     methocarbamol 500 MG tablet  Commonly known as:  ROBAXIN  Take 1 tablet (500 mg total) by mouth every 6 (six) hours as needed for muscle spasms.     pravastatin 20 MG tablet  Commonly known as:  PRAVACHOL  Take 1 tablet (20 mg total) by mouth every evening.     STYE OP  Apply 1 application to eye 2 (two) times daily as needed (for styes).     tetrahydrozoline 0.05 % ophthalmic solution  Place 1 drop into both eyes 2 (two) times daily as needed (for itchy eyes).           Follow-up Information  Follow up with [facility].   Why:  3n1 (commode)   Contact information:   [address] [place] [address] [phone]       Follow up with [facility].   Why:  physical and occupational therapy and aide   Contact information:   [address] [place] [address] [phone]       Follow up with [facility] A, MD. Schedule an appointment as soon as possible for a visit in 2 weeks.   Specialty:  Neurosurgery   Contact information:   [address] [place] [address] [place] [address] [phone]       Signed: Charlie Pitter 06/17/2014, 10:26  AM

## 2014-06-17 NOTE — Progress Notes (Signed)
Patient is being discharged to [facility]. Attempted x1 to call report to [facility]. Patient states he is in no pain, RN notes no apparent distress. Prescriptions and discharge instructions discussed with patient.

## 2014-06-17 NOTE — Progress Notes (Signed)
Report given to Evangeline at [facility].

## 2014-06-30 ENCOUNTER — Telehealth: Payer: Self-pay | Admitting: *Deleted

## 2014-06-30 NOTE — Telephone Encounter (Signed)
Please call patients wife Jolaine Artist about an referral  Call back [phone]

## 2014-06-30 NOTE — Telephone Encounter (Signed)
Spoke with wife - patient is having some issues with the left side and may need an adjustment. Follow up appt scheduled.

## 2014-07-03 ENCOUNTER — Encounter: Payer: Self-pay | Admitting: Neurology

## 2014-07-03 ENCOUNTER — Ambulatory Visit (INDEPENDENT_AMBULATORY_CARE_PROVIDER_SITE_OTHER): Payer: Medicare PPO | Admitting: Neurology

## 2014-07-03 VITALS — BP 102/70 | HR 92 | Ht 69.0 in | Wt 183.0 lb

## 2014-07-03 DIAGNOSIS — G20 Parkinson's disease: Secondary | ICD-10-CM | POA: Diagnosis not present

## 2014-07-03 DIAGNOSIS — F330 Major depressive disorder, recurrent, mild: Secondary | ICD-10-CM | POA: Diagnosis not present

## 2014-07-03 NOTE — Procedures (Signed)
DBS Programming was performed.    Total time spent programming was 60 minutes.  Device was confirmed to be on.  Soft start was confirmed to be on at 8.  Impedences were checked and were within normal limits.  Battery was checked and was determined to be functioning normally and not near the end of life.  Multiple different contacts were tried in an attempt to try and improve speech.  The device was turned off and on and the patient read various excerpts from magazines while the device was programmed in order to properly assess speech.  The device was programmed in both monopolar and bipolar modes.  In bipolar modes the patient had tremor that could not be well controlled.  This was all documented on a separate neurophysiologic worksheet.  Final settings were as follows:  Left brain electrode:     1-C+           ; Amplitude  3.8   V   ; Pulse width 90 microseconds;   Frequency   130   Hz.  Right brain electrode:     2-C+          ; Amplitude   3.8  V ;  Pulse width 60  microseconds;  Frequency   140    Hz.

## 2014-07-03 NOTE — Progress Notes (Signed)
[name] was seen today in the movement disorders clinic for neurologic consultation at the request of Dr. Annette Stable. His PCP is at the [facility] in [place].  The consultation is for the evaluation of Parkinson's disease.  The patient is accompanied by his wife who supplements the history.  The patient previously saw Dr. Jennelle Human at [facility] and I do have several of those records.  Patient was diagnosed with Parkinson's disease about 15 years ago, at the age of 74-60 years old.  He states that the first sx was not swinging of the R arm and some minor balance changes.  Balance has gotten worse with time.   The patient is currently on carbidopa/levodopa 25/100, 3 at 9am, 2 at 1, 3 at 5pm, 2 at 9 pm and 1 carbidopa/levodopa 25/100 CR at bedtime.  He is also on Mirapex 0.5 mg 3 times a day and selegiline 5 mg daily.  Because of dyskinesia, Dr. Nicki Reaper did talk to him about DBS, but according to Dr. Bary Leriche notes, the patient was skeptical.  He did have a neuropsych evaluation at the [facility] in [place], but it appears that this was more of counseling and no testing.  He did have a nocturnal polysomnogram on 06/20/2010 demonstrating an apnea hypopnea index of 16.  07/11/13 update:  Pt is f/u today re: PD.  This patient is accompanied in the office by his spouse who supplements the history.  He is on carbidopa/levodopa, 3 at 9am, 2 at 1, 3 at 5pm, 2 at 9 pm and takes carbidopa/levodopa CR 25/100  at bedtime.  This is a total daily levodopa dose of 1100 mg per day.  He is on mirapex 0.5 mg three times per day and selegeline 5 mg in the AM.  He states, "I am ready for DBS.  Tell me about what I need to do."  Pt c/o dyskinesias.  Falls on average once per week.  No hallucinations.  No n/v.  No syncope.  States that he saw himself singing at [place] and could not believe how much dyskinesia he had.  He states that when his medications don't work, he "hits a wall."  This is sometimes very unpredictable.  07/24/13:  Present with  wife for on/off test.  Actually went to PT late yesterday afternoon and was off of medication and had a very hard time.  While he has been off medication for 36 hours, his wife reports that she thinks that he has done remarkably well.  There has been minimal tremor and he really hasn't been dragging his leg much.  He expresses desire to proceed with DBS.  10/25/13 update:  Patient presents today with his wife, who supplements the history.  The patient is preop DBS.  He is scheduled for deep brain stimulation surgery on 12/06/2013.  He is scheduled for IPG placement on 12/13/2013.  Since our last visit, he did have neuropsychometric testing.  Results of this were favorable for DBS placement.  It was recommended that he faithfully wear his CPAP.  He just had his preop MRI of the brain done.  Patient is currently taking levodopa 25/100, on the following schedule: 3/2/3/2 and then an additional carbidopa/levodopa 25/100 CR at bedtime.  This is in addition to pramipexole 0.5 mg 3 times a day and selegiline 5 mg in the morning.  Overall doing well.  No hallucinations.  No falls.  Has had some more of his chronic back pain.  01/08/14 update:  The patient  presents today for followup.  He is accompanied by his wife who supplements the history.  The patient underwent bilateral STN DBS on 12/06/2013 with generator placement on 12/13/2013.  The patient's wife reports that his Parkinson's disease symptoms have actually been much better since surgery.  He did go back to taking his previous dose of medication, however.  He is on carbidopa/levodopa 25/100 on the following schedule: 3/2/3/2 and then he takes an additional carbidopa/levodopa 25/100 CR at night.  He is also on Mirapex 0.5 mg 3 times a day as well as selegiline 5 mg in the morning.  He had 2 falls since last visit.  On the one, he actually fell down a few stairs and states that he was actually able to roll onto the carpet.  He did not get hurt.  With the other one,  he states that he was walking on a flat surface and just did not expect there to be a step downward and there was.  Again, he did not get hurt.  Wife reports minor dyskinesia.  They have noted no tremor.  Rigidity has been better.  He has been very sleepy.  Wife thinks that this is because he only sleeps about 4 hours at night because he is trying to do so much during the day.  01/16/14 update:  Patient returns today, accompanied by his wife who supplements the history.  Overall, the patient has been doing well.  He remains very sleepy.  He is not sure if that is from medication.  He refuses to wear the CPAP.  He is supposed to be on a tablet of carbidopa/levodopa 25/100 per day but has dropped down to 6 on his own.  He is on pramipexole 0.5 mg twice per day.  He does not want to start speech therapy until November, because he is going out of town.  He had one fall on the stairs since last visit.  Reports that he has an old house with small steps.  He denies any dyskinesia.  He has had some swelling in the hands, right greater than left.  He has an appointment to discuss this with his primary care physician.  He denies any tremor.    01/22/14 update:  Patient returns today for followup, accompanied by his wife who supplements the history.  Patient called me almost immediately after last visit complaining about dyskinesia and he wanted me to turn his settings back down.  I had told him to instead to decrease his levodopa up to 2 tablets in the morning, one in the afternoon and one in the evening and we had already been weaning his Mirapex.  That stopped the dyskinesias which were in the R arm and he admits that some days he is only taking 3 levodopa per day.  He is sometimes having what sounds like myoclonic jerking in the R arm.  He also c/o "aching" in the joints all over the body "but it doesn't feel like the stiffness of parkinsons disease."  He still has swelling.  He did f/u with PCP but etiology is unknown.     02/17/14 update:  This patient is accompanied in the office by his spouse who supplements the history.  Has had several falls.  Almost fell into the fireplace.  [place] backwards going down the stairs.  No serious injuries but still multiple falls.  Generally occur on the stairs.  Wrecked his [place] as missed the brake.  No tremor but did turn off his device  and notes that tremor will immediately return.  Off the mirapex but still sleeping "all the time."  Wife states that no longer doing the things that he is to work to do.  He is less involved with the church, which he used to be very passionate about.  He is not exercising like he was.  He started speech therapy today.  He had a  Modified barium swallow on 02/05/2014 that demonstrated moderate pharyngeal phase dysphagia and recommended a dysphagia 3 diet (mechanical soft) with nectar thick liquids.  They just got the nectar thick-it today.  His wife thinks that perhaps he is depressed.  She thinks that maybe that is why he is sleeping all the time.the patient also asked me about a referral to orthopedics for a rotator cuff problem on the left.  He is having severe pain, but does not necessarily want surgery.  04/15/14 update:  The patient returns today for follow-up, accompanied by his wife who supplements the history.  He remains on carbidopa/levodopa 25/100, 1 tablet 4 times per day.  He was started on Wellbutrin last visit.  This has been worked up to 300 mg daily.  His mood is much better.  His motivation is much better.  He is sleeping much less.  His balance is not very good.  He has not yet started physical therapy, but plans to next week.  He has finished LSVT loud.  He notices some tremor of the left hand and tremor of the right face.  We did have him try to turn off the DBS to see what would happen with the tremor right face and he got much worse.  He has seen Dr. Maureen Ralphs and had cortisone injections into the right shoulder.  That has helped.  He had an  EMG that demonstrated chronic multilevel radiculopathy from L2-S1 bilaterally.  There was no evidence of peripheral neuropathy.  05/08/13 update:  This patient is accompanied in the office by his spouse who supplements the history.  Overall, the patient is doing well but he is contemplating back surgery.  He saw Dr. Trenton Gammon regarding this and is supposed to have a CT myelogram soon.  He presents today because he is having some tremor of the right face and arm and left arm tremor, especially when he is stressed.  He takes 2 levodopa in the morning, one in the afternoon and one in the evening.  He really does fairly well with that.  Tremor is worse in the evening.  No falls.  He is doing fairly well with Wellbutrin for his mood.  He has put physical therapy on hold.  07/03/14 update:  Patient is accompanied by his wife, who supplements the history.  He had a back surgery on 06/13/2014 by Dr. Trenton Gammon.  Records are reviewed from the surgery.  He had an L1-L2 laminectomy and reexplored the L4-L5 laminectomies with foraminotomies.  The patient was released from rehabilitation on March 11 and subsequently came to our Parkinson's educational event.  At that event, we talked about the fact that DBS can worsen speech and he thinks that perhaps the device has caused some speech difficulties.  His wife also thinks that his mood has gotten worse, especially since his most recent back surgery.  He is doing better in terms of the back pain.  He is weaning off of gabapentin.  Physical therapy is coming into his house.  He has noticed some left hand and right leg tremor.     Neuroimaging has  previously been performed.  It is not available for my review today.  PREVIOUS MEDICATIONS: Sinemet, Sinemet CR, Mirapex and Eldpryl  ALLERGIES:  No Known Allergies  CURRENT MEDICATIONS:    Medication List       This list is accurate as of: 07/03/14  3:20 PM.  Always use your most recent med list.               BLINK TEARS  0.25 % Gel  Generic drug:  Polyethylene Glycol 400  Apply 1 drop to eye as needed (for dry eyes).     buPROPion 300 MG 24 hr tablet  Commonly known as:  WELLBUTRIN XL  Take 1 tablet (300 mg total) by mouth daily.     carbidopa-levodopa 25-100 MG per tablet  Commonly known as:  SINEMET IR  Take 1-2 tablets by mouth 2 (two) times daily. Take 2 tablets by mouth in the morning, 1 tablets in the afternoon and take 1 tablet by mouth in the evening.     cholecalciferol 1000 UNITS tablet  Commonly known as:  VITAMIN D  Take 1,000 Units by mouth daily.     fluticasone 50 MCG/ACT nasal spray  Commonly known as:  FLONASE  Place 2 sprays into both nostrils daily as needed for allergies.     gabapentin 300 MG capsule  Commonly known as:  NEURONTIN  Take 1 capsule (300 mg total) by mouth 3 (three) times daily.     hydrochlorothiazide 25 MG tablet  Commonly known as:  HYDRODIURIL  Take 1 tablet (25 mg total) by mouth daily.     HYDROcodone-acetaminophen 5-325 MG per tablet  Commonly known as:  NORCO/VICODIN  Take 1-2 tablets by mouth every 4 (four) hours as needed for moderate pain.     methocarbamol 500 MG tablet  Commonly known as:  ROBAXIN  Take 1 tablet (500 mg total) by mouth every 6 (six) hours as needed for muscle spasms.     pravastatin 20 MG tablet  Commonly known as:  PRAVACHOL  Take 1 tablet (20 mg total) by mouth every evening.     STYE OP  Apply 1 application to eye 2 (two) times daily as needed (for styes).     tetrahydrozoline 0.05 % ophthalmic solution  Place 1 drop into both eyes 2 (two) times daily as needed (for itchy eyes).         PAST MEDICAL HISTORY:   Past Medical History  Diagnosis Date  . Parkinson's disease     dx'ed 15 years ago  . PTSD (post-traumatic stress disorder)   . Bradycardia   . Hypertension     treated with HCTZ  . Sleep apnea     doesn't use C-pap  . Varicose veins   . Cancer 2013    skin cancer  . Arthritis   . History of  chicken pox   . GERD (gastroesophageal reflux disease)   . Dysrhythmia     chronic slow heart rate  . Depression     ptsd  . History of kidney stones   . Headache(784.0)     tension headaches non recent  . Shortness of breath dyspnea     PAST SURGICAL HISTORY:   Past Surgical History  Procedure Laterality Date  . Cyst removed       from lip as a child  . Skin cancer removed      from ears,   11 lft arm  rt leg 14  . Lumbar laminectomy/decompression microdiscectomy  Bilateral 12/14/2012    Procedure: Bilateral lumbar three-four, four-five decompressive laminotomy/foraminotomy;  Surgeon: Charlie Pitter, MD;  Location: [facility];  Service: Neurosurgery;  Laterality: Bilateral;  . Subthalamic stimulator insertion Bilateral 12/06/2013    Procedure: SUBTHALAMIC STIMULATOR INSERTION;  Surgeon: Erline Levine, MD;  Location: [facility]UR[facility] OR[facility];  Service: Neurosurgery;  Laterality: Bilateral;  Bilateral deep brain stimulator placement  . Pulse generator implant Bilateral 12/13/2013    Procedure: Bilateral implantable pulse generator placement;  Surgeon: Erline Levine, MD;  Location: [facility] NEURO ORS;  Service: Neurosurgery;  Laterality: Bilateral;  Bilateral implantable pulse generator placement    SOCIAL HISTORY:   History   Social History  . Marital Status: Married    Spouse Name: N/A  . Number of Children: N/A  . Years of Education: N/A   Occupational History  . disabled     2001, [employer]  .      search and rescue helicopter   Social History Main Topics  . Smoking status: Never Smoker   . Smokeless tobacco: Former Systems developer  . Alcohol Use: Yes     Comment: occasional (twice per month)  . Drug Use: No  . Sexual Activity: Not on file   Other Topics Concern  . 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    Married 2014    FAMILY HISTORY:   Family Status  Relation Status Death Age  . Mother Alive      Arthritis, unknown medical hx  . Father Deceased     Lung Cancer  . Sister Alive     1 and one half sister  . Brother Alive   . Son Alive   . Daughter Alive     2    ROS:  A complete 10 system review of systems was obtained and was unremarkable apart from what is mentioned above.  PHYSICAL EXAMINATION:    VITALS:   Filed Vitals:   07/03/14 1240  BP: 102/70  Pulse: 92  Height: 5\' 9"  (1.753 m)  Weight: 183 lb (83.008 kg)    GEN:  The patient appears stated age and is in NAD.   HEENT:  Normocephalic, atraumatic.  The mucous membranes are moist. The superficial temporal arteries are without ropiness or tenderness. Cardiovascular: Regular rate and rhythm Lungs: Clear to auscultation bilaterally   Neurological examination:  Orientation: The patient is alert and oriented x3.  He is awake  Cranial nerves: There is good facial symmetry.  The speech is fluent but significantly dysarthric.  With the device off, his speech is slightly improved and less dysarthric than with the device on.  Soft palate rises symmetrically and there is no tongue deviation. Hearing is intact to conversational tone. Sensation: Sensation is intact to light touch throughout. Motor: Strength is 5/5 in the bilateral upper and lower extremities.   Shoulder shrug is equal and symmetric.  There is no pronator drift. Deep tendon reflexes: Deferred today.  Movement examination: Tone: No rigidity Abnormal movements: Some tremor of the left hand and right foot Coordination: There is minor decremation with rapid alternating movements on the left. Gait and Station: The patient has no difficulty arising out of a deep-seated chair without the use of the hands. The patient's stride length is good.    DBS programming was performed today which is described in more detail on a separate programming procedural notes.  I spent a significant amount of time reprogramming the device today and attempts to improve speech and this  is described in more detail on a separate programming procedural note     ASSESSMENT/PLAN:  1.  Parkinsons disease by hx. He has been seeing Dr. Jennelle Human through the [facility].    -The patient is status post bilateral STN DBS on 12/06/2013 with battery placement on 12/13/2013.  -off of mirapex  -Continue carbidopa/levodopa 25/100, 2 in the AM, 1 in the afternoon and 1 in the evening.  Ms. dosages closer together as he is currently taking the last dose at bedtime and the second dose not until 3 PM  -he is off of selegiline.    -He is currently in physical therapy for his back and I wanted him to add speech therapy but he is very resistant to this. 2.  LBP.   -Status post repeat L4-L5 laminotomy with foraminotomy and L1-L2 laminectomy on 06/13/2014 3.  Dysphagia  -He had a  Modified barium swallow on 02/05/2014 that demonstrated moderate pharyngeal phase dysphagia and recommended a dysphagia 3 diet (mechanical soft) with nectar thick liquids.   4.  OSAS  -We talked about morbidity and mortality associated with untreated sleep apnea.    He was not able to tolerate the cpap despite trying multiple masks. 5.  Depression  -sleepiness and motivation was improved on Wellbutrin XL, 300 mg, but they have seen it worsening since his recent back surgery.  We talked about increasing the dose, but they want to give it a few weeks and then they will see if they want to increase it or perhaps even change medications. 6.  Left  Shoulder pain  -better after injection.   7.  He has an appointment here in a few weeks and they want to keep that.

## 2014-07-15 ENCOUNTER — Ambulatory Visit: Payer: Medicare PPO | Admitting: Neurology

## 2014-07-18 ENCOUNTER — Encounter
Admit: 2014-07-18 | Disposition: A | Discharge status: Home / Self Care | Payer: Self-pay | Attending: Internal Medicine | Admitting: Internal Medicine

## 2014-07-21 ENCOUNTER — Ambulatory Visit (INDEPENDENT_AMBULATORY_CARE_PROVIDER_SITE_OTHER): Payer: Medicare PPO | Admitting: Family Medicine

## 2014-07-21 ENCOUNTER — Encounter: Payer: Self-pay | Admitting: Family Medicine

## 2014-07-21 VITALS — BP 110/80 | HR 52 | Temp 98.4°F | Wt 181.2 lb

## 2014-07-21 DIAGNOSIS — H6593 Unspecified nonsuppurative otitis media, bilateral: Secondary | ICD-10-CM | POA: Diagnosis not present

## 2014-07-21 DIAGNOSIS — H6590 Unspecified nonsuppurative otitis media, unspecified ear: Secondary | ICD-10-CM | POA: Insufficient documentation

## 2014-07-21 DIAGNOSIS — R059 Cough, unspecified: Secondary | ICD-10-CM

## 2014-07-21 DIAGNOSIS — R05 Cough: Secondary | ICD-10-CM

## 2014-07-21 MED ORDER — FLUTICASONE PROPIONATE 50 MCG/ACT NA SUSP: 2.0000 | Freq: Every day | NASAL | Status: DC | PRN

## 2014-07-21 MED ORDER — BENZONATATE 200 MG PO CAPS: 200.0000 mg | ORAL_CAPSULE | Freq: Three times a day (TID) | ORAL | Status: DC | PRN

## 2014-07-21 NOTE — Progress Notes (Signed)
Pre visit review using our clinic review tool, if applicable. No additional management support is needed unless otherwise documented below in the visit note.  "I think I have water in my left ear".  Sx stated months ago, before his back surgery.  Didn't change with the surgery.  Since surgery, off mult meds.  Tremor is still much improved after DBS.  Some trouble walking in a straight line, but no room spinning.  No ear pain, but he feels different than the R side.  No interventions attempted.   Woke up with a cough this AM.  No FCNAVD.  Dry cough.  No sputum.  She has some troubles with swallowing, at baseline.  Last week he sneezed right after taking a bite and that led to a cough.  The episode passed on its own last week.  He is trying to get set back up with speech therapy.    Meds, vitals, and allergies reviewed.   ROS: See HPI.  Otherwise, noncontributory.  nad ncat Tm wnl B except for mild B SOM w/o erythema Nasal exam slightly stuffy OP wnl Neck supple, no LA rrr ctab Ext w/o edema Faint tremor noted in R leg.

## 2014-07-21 NOTE — Assessment & Plan Note (Signed)
Inc flonase for now.  His gait sx are likely from PD, not from vertigo or another process.  Update me as needed.  He agrees.

## 2014-07-21 NOTE — Patient Instructions (Signed)
Tessalon for cough.  Get back in speech therapy.  Use flonase 2 sprays in each nostril in the meantime.  Take care.  Glad to see you.

## 2014-07-21 NOTE — Assessment & Plan Note (Addendum)
He has the cough and has known aspiration risk, but his lungs are clear and he doesn't have a fever.  Other than using tessalon, I would only observe for now.  He'll get back in ST in the meantime, d/w pt.

## 2014-07-24 ENCOUNTER — Ambulatory Visit: Payer: Medicare PPO | Admitting: Neurology

## 2014-07-25 ENCOUNTER — Ambulatory Visit
Admit: 2014-07-25 | Disposition: A | Discharge status: Home / Self Care | Payer: Self-pay | Attending: Neurosurgery | Admitting: Neurosurgery

## 2014-07-28 ENCOUNTER — Encounter: Payer: Self-pay | Admitting: Neurology

## 2014-07-28 ENCOUNTER — Ambulatory Visit (INDEPENDENT_AMBULATORY_CARE_PROVIDER_SITE_OTHER): Payer: Medicare PPO | Admitting: Neurology

## 2014-07-28 VITALS — BP 124/70 | HR 72 | Ht 69.0 in | Wt 181.0 lb

## 2014-07-28 DIAGNOSIS — G20 Parkinson's disease: Secondary | ICD-10-CM

## 2014-07-28 DIAGNOSIS — Z9689 Presence of other specified functional implants: Secondary | ICD-10-CM

## 2014-07-28 DIAGNOSIS — Z9889 Other specified postprocedural states: Secondary | ICD-10-CM

## 2014-07-28 DIAGNOSIS — G4733 Obstructive sleep apnea (adult) (pediatric): Secondary | ICD-10-CM

## 2014-07-28 DIAGNOSIS — G4730 Sleep apnea, unspecified: Secondary | ICD-10-CM

## 2014-07-28 DIAGNOSIS — F330 Major depressive disorder, recurrent, mild: Secondary | ICD-10-CM | POA: Diagnosis not present

## 2014-07-28 DIAGNOSIS — G4710 Hypersomnia, unspecified: Secondary | ICD-10-CM

## 2014-07-28 MED ORDER — MODAFINIL 200 MG PO TABS: 200.0000 mg | ORAL_TABLET | Freq: Every day | ORAL | Status: DC

## 2014-07-28 NOTE — Procedures (Signed)
DBS Programming was performed.    Total time spent programming was 25 minutes.  Device was confirmed to be on.  Soft start was confirmed to be on at 8.  Impedences were checked and were within normal limits.  Battery was checked and was determined to be functioning normally and not near the end of life.   This was all documented on a separate neurophysiologic worksheet.  Final settings were as follows:  Left brain electrode:     1-C+           ; Amplitude  4.0   V   ; Pulse width 90 microseconds;   Frequency   140   Hz.  Right brain electrode:     2-C+          ; Amplitude   3.9  V ;  Pulse width 60  microseconds;  Frequency   130    Hz.

## 2014-07-28 NOTE — Progress Notes (Signed)
[name] was seen today in the movement disorders clinic for neurologic consultation at the request of Dr. Annette Stable. His PCP is at the [facility] in [place].  The consultation is for the evaluation of Parkinson's disease.  The patient is accompanied by his wife who supplements the history.  The patient previously saw Dr. Jennelle Human at [facility] and I do have several of those records.  Patient was diagnosed with Parkinson's disease about 15 years ago, at the age of [age] years old.  He states that the first sx was not swinging of the R arm and some minor balance changes.  Balance has gotten worse with time.   The patient is currently on carbidopa/levodopa 25/100, 3 at 9am, 2 at 1, 3 at 5pm, 2 at 9 pm and 1 carbidopa/levodopa 25/100 CR at bedtime.  He is also on Mirapex 0.5 mg 3 times a day and selegiline 5 mg daily.  Because of dyskinesia, Dr. Nicki Reaper did talk to him about DBS, but according to Dr. Bary Leriche notes, the patient was skeptical.  He did have a neuropsych evaluation at the [facility] in [place], but it appears that this was more of counseling and no testing.  He did have a nocturnal polysomnogram on 06/20/2010 demonstrating an apnea hypopnea index of 16.  07/11/13 update:  Pt is f/u today re: PD.  This patient is accompanied in the office by his spouse who supplements the history.  He is on carbidopa/levodopa, 3 at 9am, 2 at 1, 3 at 5pm, 2 at 9 pm and takes carbidopa/levodopa CR 25/100  at bedtime.  This is a total daily levodopa dose of 1100 mg per day.  He is on mirapex 0.5 mg three times per day and selegeline 5 mg in the AM.  He states, "I am ready for DBS.  Tell me about what I need to do."  Pt c/o dyskinesias.  Falls on average once per week.  No hallucinations.  No n/v.  No syncope.  States that he saw himself singing at [place] and could not believe how much dyskinesia he had.  He states that when his medications don't work, he "hits a wall."  This is sometimes very unpredictable.  07/24/13:  Present with  wife for on/off test.  Actually went to PT late yesterday afternoon and was off of medication and had a very hard time.  While he has been off medication for 36 hours, his wife reports that she thinks that he has done remarkably well.  There has been minimal tremor and he really hasn't been dragging his leg much.  He expresses desire to proceed with DBS.  10/25/13 update:  Patient presents today with his wife, who supplements the history.  The patient is preop DBS.  He is scheduled for deep brain stimulation surgery on 12/06/2013.  He is scheduled for IPG placement on 12/13/2013.  Since our last visit, he did have neuropsychometric testing.  Results of this were favorable for DBS placement.  It was recommended that he faithfully wear his CPAP.  He just had his preop MRI of the brain done.  Patient is currently taking levodopa 25/100, on the following schedule: 3/2/3/2 and then an additional carbidopa/levodopa 25/100 CR at bedtime.  This is in addition to pramipexole 0.5 mg 3 times a day and selegiline 5 mg in the morning.  Overall doing well.  No hallucinations.  No falls.  Has had some more of his chronic back pain.  01/08/14 update:  The patient  presents today for followup.  He is accompanied by his wife who supplements the history.  The patient underwent bilateral STN DBS on 12/06/2013 with generator placement on 12/13/2013.  The patient's wife reports that his Parkinson's disease symptoms have actually been much better since surgery.  He did go back to taking his previous dose of medication, however.  He is on carbidopa/levodopa 25/100 on the following schedule: 3/2/3/2 and then he takes an additional carbidopa/levodopa 25/100 CR at night.  He is also on Mirapex 0.5 mg 3 times a day as well as selegiline 5 mg in the morning.  He had 2 falls since last visit.  On the one, he actually fell down a few stairs and states that he was actually able to roll onto the carpet.  He did not get hurt.  With the other one,  he states that he was walking on a flat surface and just did not expect there to be a step downward and there was.  Again, he did not get hurt.  Wife reports minor dyskinesia.  They have noted no tremor.  Rigidity has been better.  He has been very sleepy.  Wife thinks that this is because he only sleeps about 4 hours at night because he is trying to do so much during the day.  01/16/14 update:  Patient returns today, accompanied by his wife who supplements the history.  Overall, the patient has been doing well.  He remains very sleepy.  He is not sure if that is from medication.  He refuses to wear the CPAP.  He is supposed to be on a tablet of carbidopa/levodopa 25/100 per day but has dropped down to 6 on his own.  He is on pramipexole 0.5 mg twice per day.  He does not want to start speech therapy until November, because he is going out of town.  He had one fall on the stairs since last visit.  Reports that he has an old house with small steps.  He denies any dyskinesia.  He has had some swelling in the hands, right greater than left.  He has an appointment to discuss this with his primary care physician.  He denies any tremor.    01/22/14 update:  Patient returns today for followup, accompanied by his wife who supplements the history.  Patient called me almost immediately after last visit complaining about dyskinesia and he wanted me to turn his settings back down.  I had told him to instead to decrease his levodopa up to 2 tablets in the morning, one in the afternoon and one in the evening and we had already been weaning his Mirapex.  That stopped the dyskinesias which were in the R arm and he admits that some days he is only taking 3 levodopa per day.  He is sometimes having what sounds like myoclonic jerking in the R arm.  He also c/o "aching" in the joints all over the body "but it doesn't feel like the stiffness of parkinsons disease."  He still has swelling.  He did f/u with PCP but etiology is unknown.     02/17/14 update:  This patient is accompanied in the office by his spouse who supplements the history.  Has had several falls.  Almost fell into the fireplace.  [place] backwards going down the stairs.  No serious injuries but still multiple falls.  Generally occur on the stairs.  Wrecked his [place] as missed the brake.  No tremor but did turn off his device  and notes that tremor will immediately return.  Off the mirapex but still sleeping "all the time."  Wife states that no longer doing the things that he is to work to do.  He is less involved with the church, which he used to be very passionate about.  He is not exercising like he was.  He started speech therapy today.  He had a  Modified barium swallow on 02/05/2014 that demonstrated moderate pharyngeal phase dysphagia and recommended a dysphagia 3 diet (mechanical soft) with nectar thick liquids.  They just got the nectar thick-it today.  His wife thinks that perhaps he is depressed.  She thinks that maybe that is why he is sleeping all the time.the patient also asked me about a referral to orthopedics for a rotator cuff problem on the left.  He is having severe pain, but does not necessarily want surgery.  04/15/14 update:  The patient returns today for follow-up, accompanied by his wife who supplements the history.  He remains on carbidopa/levodopa 25/100, 1 tablet 4 times per day.  He was started on Wellbutrin last visit.  This has been worked up to 300 mg daily.  His mood is much better.  His motivation is much better.  He is sleeping much less.  His balance is not very good.  He has not yet started physical therapy, but plans to next week.  He has finished LSVT loud.  He notices some tremor of the left hand and tremor of the right face.  We did have him try to turn off the DBS to see what would happen with the tremor right face and he got much worse.  He has seen Dr. Maureen Ralphs and had cortisone injections into the right shoulder.  That has helped.  He had an  EMG that demonstrated chronic multilevel radiculopathy from L2-S1 bilaterally.  There was no evidence of peripheral neuropathy.  05/08/13 update:  This patient is accompanied in the office by his spouse who supplements the history.  Overall, the patient is doing well but he is contemplating back surgery.  He saw Dr. Trenton Gammon regarding this and is supposed to have a CT myelogram soon.  He presents today because he is having some tremor of the right face and arm and left arm tremor, especially when he is stressed.  He takes 2 levodopa in the morning, one in the afternoon and one in the evening.  He really does fairly well with that.  Tremor is worse in the evening.  No falls.  He is doing fairly well with Wellbutrin for his mood.  He has put physical therapy on hold.  07/03/14 update:  Patient is accompanied by his wife, who supplements the history.  He had a back surgery on 06/13/2014 by Dr. Trenton Gammon.  Records are reviewed from the surgery.  He had an L1-L2 laminectomy and reexplored the L4-L5 laminectomies with foraminotomies.  The patient was released from rehabilitation on March 11 and subsequently came to our Parkinson's educational event.  At that event, we talked about the fact that DBS can worsen speech and he thinks that perhaps the device has caused some speech difficulties.  His wife also thinks that his mood has gotten worse, especially since his most recent back surgery.  He is doing better in terms of the back pain.  He is weaning off of gabapentin.  Physical therapy is coming into his house.  He has noticed some left hand and right leg tremor.  07/28/14 update:  Pt is  accompanied by his wife, who supplements the history.  He is continuing to have EDS, which he has had long prior to DBS therapy.  He has OSAS but has not been able to tolerate CPAP therapy.  He has no cardiac disease.  We made significant changes to his programing last visit because of stuttering/speech issues and his wife states this is much  better.  He is going to resume ST because of choking issues and that is to restart tomorrow.  He is having some tremor in the L hand and right shoulder.  When asked about mood, his wife states that "he needs an attitude adjustment some days."  She does state that his driving seems much better after his gabapentin was discontinued.  He is not having any lightheadedness.  No hallucinations.  No confusion.   Neuroimaging has  previously been performed.  It is not available for my review today.  PREVIOUS MEDICATIONS: Sinemet, Sinemet CR, Mirapex and Eldpryl  ALLERGIES:  No Known Allergies  CURRENT MEDICATIONS:    Medication List       This list is accurate as of: 07/28/14  9:42 AM.  Always use your most recent med list.               benzonatate 200 MG capsule  Commonly known as:  TESSALON  Take 1 capsule (200 mg total) by mouth 3 (three) times daily as needed.     buPROPion 300 MG 24 hr tablet  Commonly known as:  WELLBUTRIN XL  Take 1 tablet (300 mg total) by mouth daily.     carbidopa-levodopa 25-100 MG per tablet  Commonly known as:  SINEMET IR  Take 1-2 tablets by mouth 2 (two) times daily. Take 2 tablets by mouth in the morning, 1 tablets in the afternoon and take 1 tablet by mouth in the evening.     cholecalciferol 1000 UNITS tablet  Commonly known as:  VITAMIN D  Take 1,000 Units by mouth daily.     fluticasone 50 MCG/ACT nasal spray  Commonly known as:  FLONASE  Place 2 sprays into both nostrils daily as needed for allergies.     hydrochlorothiazide 25 MG tablet  Commonly known as:  HYDRODIURIL  Take 1 tablet (25 mg total) by mouth daily.     ibuprofen 600 MG tablet  Commonly known as:  ADVIL,MOTRIN  Take 600 mg by mouth as needed.     pravastatin 20 MG tablet  Commonly known as:  PRAVACHOL  Take 1 tablet (20 mg total) by mouth every evening.         PAST MEDICAL HISTORY:   Past Medical History  Diagnosis Date  . Parkinson's disease     dx'ed 15 years  ago  . PTSD (post-traumatic stress disorder)   . Bradycardia   . Hypertension     treated with HCTZ  . Sleep apnea     doesn't use C-pap  . Varicose veins   . Cancer 2013    skin cancer  . Arthritis   . History of chicken pox   . GERD (gastroesophageal reflux disease)   . Dysrhythmia     chronic slow heart rate  . Depression     ptsd  . History of kidney stones   . Headache(784.0)     tension headaches non recent  . Shortness of breath dyspnea     PAST SURGICAL HISTORY:   Past Surgical History  Procedure Laterality Date  . Cyst removed  from lip as a child  . Skin cancer removed      from ears,   11 lft arm  rt leg 14  . Lumbar laminectomy/decompression microdiscectomy Bilateral 12/14/2012    Procedure: Bilateral lumbar three-four, four-five decompressive laminotomy/foraminotomy;  Surgeon: Charlie Pitter, MD;  Location: [facility];  Service: Neurosurgery;  Laterality: Bilateral;  . Subthalamic stimulator insertion Bilateral 12/06/2013    Procedure: SUBTHALAMIC STIMULATOR INSERTION;  Surgeon: Erline Levine, MD;  Location: [facility];  Service: Neurosurgery;  Laterality: Bilateral;  Bilateral deep brain stimulator placement  . Pulse generator implant Bilateral 12/13/2013    Procedure: Bilateral implantable pulse generator placement;  Surgeon: Erline Levine, MD;  Location: [facility];  Service: Neurosurgery;  Laterality: Bilateral;  Bilateral implantable pulse generator placement    SOCIAL HISTORY:   History   Social History  . Marital Status: Married    Spouse Name: N/A  . Number of Children: N/A  . Years of Education: N/A   Occupational History  . disabled     2001, [employer]  .      search and rescue helicopter   Social History Main Topics  . Smoking status: Never Smoker   . Smokeless tobacco: Former Systems developer  . Alcohol Use: Yes     Comment: occasional (twice per month)  . Drug Use: No  . Sexual Activity: Not on file   Other Topics Concern  . Not on file    Social History Narrative   Previous Therapist, art, [employer]   H/o PTSD.  Prev search and rescue work   3 kids from prev relationship.    Married 2014    FAMILY HISTORY:   Family Status  Relation Status Death Age  . Mother Alive     Arthritis, unknown medical hx  . Father Deceased     Lung Cancer  . Sister Alive     1 and one half sister  . Brother Alive   . Son Alive   . Daughter Alive     2    ROS:  A complete 10 system review of systems was obtained and was unremarkable apart from what is mentioned above.  PHYSICAL EXAMINATION:    VITALS:   Filed Vitals:   07/28/14 0938  BP: 124/70  Pulse: 72  Height: 5\' 9"  (1.753 m)  Weight: 181 lb (82.101 kg)    GEN:  The patient appears stated age and is in NAD.   HEENT:  Normocephalic, atraumatic.  The mucous membranes are moist. The superficial temporal arteries are without ropiness or tenderness. Cardiovascular: Regular rate and rhythm Lungs: Clear to auscultation bilaterally   Neurological examination:  Orientation: The patient is alert and oriented x3.  He is awake  Cranial nerves: There is good facial symmetry.  The speech is fluent but dysarthric.    Soft palate rises symmetrically and there is no tongue deviation. Hearing is intact to conversational tone. Sensation: Sensation is intact to light touch throughout. Motor: Strength is 5/5 in the bilateral upper and lower extremities.   Shoulder shrug is equal and symmetric.  There is no pronator drift. Deep tendon reflexes: Deferred today.  Movement examination: Tone: No rigidity Abnormal movements: No tremor noted today. Coordination: There is minor decremation with rapid alternating movements on the left. Gait and Station: The patient has no difficulty arising out of a deep-seated chair without the use of the hands. The patient's stride length is good.    DBS programming was performed today which is described  in more detail on a separate programming procedural notes.   I  ASSESSMENT/PLAN:  1.  Parkinsons disease by hx. He has been seeing Dr. Jennelle Human through the [facility].    -The patient is status post bilateral STN DBS on 12/06/2013 with battery placement on 12/13/2013.  -off of mirapex  -Continue carbidopa/levodopa 25/100, 2 in the AM, 1 in the afternoon and 1 in the evening.  Ms. dosages closer together as he is currently taking the last dose at bedtime and the second dose not until 3 PM  -he is off of selegiline.    -He is currently in physical therapy for his back and I wanted him to add speech therapy but he is very resistant to this. 2.  LBP.   -Status post repeat L4-L5 laminotomy with foraminotomy and L1-L2 laminectomy on 06/13/2014 3.  Dysphagia  -He had a  Modified barium swallow on 02/05/2014 that demonstrated moderate pharyngeal phase dysphagia and recommended a dysphagia 3 diet (mechanical soft) with nectar thick liquids.   4.  OSAS with excessive daytime hypersomnolence.  -We talked about morbidity and mortality associated with untreated sleep apnea.    He was not able to tolerate the cpap despite trying multiple masks.  -The excessive daytime hypersomnolence may all relate to this.  We will try to add Provigil, 200 mg daily.  Risks, benefits, side effects and alternative therapies were discussed.  The opportunity to ask questions was given and they were answered to the best of my ability.  The patient expressed understanding and willingness to follow the outlined treatment protocols. 5.  Depression  -sleepiness and motivation was improved on Wellbutrin XL, 300 mg, but they have seen it worsening since his recent back surgery.  We talked about increasing the dose, but they want to give it a few weeks and then they will see if they want to increase it or perhaps even change medications. 6.  Left  Shoulder pain  -better after injection.   8.  Follow-up in the next few months, sooner should new neurologic issues arise.

## 2014-08-18 ENCOUNTER — Telehealth: Payer: Self-pay | Admitting: Neurology

## 2014-08-18 NOTE — Telephone Encounter (Signed)
Patient's wife made aware. They will try this for a week and let me know if I need to call in a new RX written this way.

## 2014-08-18 NOTE — Telephone Encounter (Signed)
Pt wife [name]  She states that the medication that is to help him stay awake is not working please call [phone]

## 2014-08-18 NOTE — Telephone Encounter (Signed)
Patient on Provigil 200 mg daily. Please advise.

## 2014-08-18 NOTE — Telephone Encounter (Signed)
Increase to 200 mg bid but last dose should not be after noon

## 2014-08-20 ENCOUNTER — Ambulatory Visit (INDEPENDENT_AMBULATORY_CARE_PROVIDER_SITE_OTHER): Payer: Medicare PPO | Admitting: Family Medicine

## 2014-08-20 ENCOUNTER — Encounter: Payer: Self-pay | Admitting: Family Medicine

## 2014-08-20 VITALS — BP 124/78 | HR 60 | Temp 98.6°F | Wt 179.8 lb

## 2014-08-20 DIAGNOSIS — M720 Palmar fascial fibromatosis [Dupuytren]: Secondary | ICD-10-CM

## 2014-08-20 DIAGNOSIS — H0289 Other specified disorders of eyelid: Secondary | ICD-10-CM | POA: Diagnosis not present

## 2014-08-20 MED ORDER — KETOTIFEN FUMARATE 0.025 % OP SOLN: 1.0000 [drp] | Freq: Two times a day (BID) | OPHTHALMIC | Status: DC

## 2014-08-20 MED ORDER — KETOTIFEN FUMARATE 0.025 % OP SOLN
1.0000 [drp] | Freq: Two times a day (BID) | OPHTHALMIC | Status: DC
Start: 2014-08-20 — End: 2015-04-01

## 2014-08-20 NOTE — Progress Notes (Signed)
Pre visit review using our clinic review tool, if applicable. No additional management support is needed unless otherwise documented below in the visit note.  Dupuytren's contracture noted on the L hand.  Noted in the last few weeks.  Painful.  Pain with ROM of the finger.  No trauma, no trigger.    Rims of eyelids get irritated and red.  He tends to rub his eyes in the AM, on waking; he didn't know if that contributed.  Not painful, but itchy. Not much discharge in the day, but more recently in the AM.  Vision still at baseline.  No eye pain.    Meds, vitals, and allergies reviewed.   ROS: See HPI.  Otherwise, noncontributory.  nad ncat B upper and low eyelid irritation at the margines of the lids but no discharge and conjunctiva wnl B PERRl and EOMI Hands with palmar L>R Dupuytren's contracture noted but still has full ROM of the fingers.

## 2014-08-20 NOTE — Assessment & Plan Note (Signed)
Coincides with allergy season/inc in pollen load recently.  Would try zaditor to try to limit systemic antihistamine exposure and update me as needed.  He agrees.

## 2014-08-20 NOTE — Assessment & Plan Note (Signed)
D/w pt, with ROM still intact.  Pain isn't bothersome enough to treat with meds yet.  Refer to hand surgery.  He agrees.

## 2014-08-20 NOTE — Patient Instructions (Addendum)
Rosaria Ferries will call about your referral. Try zaditor for the eye symptoms.  That should help.  If not, then let me know. t Take care.  Glad to see you.

## 2014-08-22 ENCOUNTER — Other Ambulatory Visit: Payer: Self-pay | Admitting: Neurosurgery

## 2014-08-22 ENCOUNTER — Ambulatory Visit
Admission: RE | Admit: 2014-08-22 | Discharge: 2014-08-22 | Disposition: A | Discharge status: Home / Self Care | Payer: Medicare PPO | Source: Ambulatory Visit | Attending: Neurosurgery | Admitting: Neurosurgery

## 2014-08-22 DIAGNOSIS — M5136 Other intervertebral disc degeneration, lumbar region: Secondary | ICD-10-CM | POA: Diagnosis not present

## 2014-08-22 DIAGNOSIS — M4317 Spondylolisthesis, lumbosacral region: Secondary | ICD-10-CM | POA: Insufficient documentation

## 2014-08-28 ENCOUNTER — Encounter: Payer: Self-pay | Admitting: Family Medicine

## 2014-08-28 ENCOUNTER — Ambulatory Visit (INDEPENDENT_AMBULATORY_CARE_PROVIDER_SITE_OTHER): Payer: Medicare PPO | Admitting: Family Medicine

## 2014-08-28 VITALS — BP 120/60 | HR 61 | Temp 98.5°F | Wt 181.8 lb

## 2014-08-28 DIAGNOSIS — H0289 Other specified disorders of eyelid: Secondary | ICD-10-CM | POA: Diagnosis not present

## 2014-08-28 MED ORDER — ERYTHROMYCIN 5 MG/GM OP OINT: 1.0000 "application " | TOPICAL_OINTMENT | Freq: Four times a day (QID) | OPHTHALMIC | Status: DC

## 2014-08-28 NOTE — Assessment & Plan Note (Signed)
Now with FB sensation, but no FB seen and no abrasion seen.  Would still be okay to use erythromycin ointment, both as a protective agent and in case he has any infectious cause for the irritation on the eyelid itself.  dw pt.  He agrees.  F/u prn.

## 2014-08-28 NOTE — Patient Instructions (Signed)
Start using erythromycin ointment in your left eye 4 times a day and this should get better.  Take care.  Glad to see you.

## 2014-08-28 NOTE — Progress Notes (Signed)
Pre visit review using our clinic review tool, if applicable. No additional management support is needed unless otherwise documented below in the visit note.  B lid irritation prev noted, got zaditor with some improvement.   Now with a separate issue.  Has sensation of FB in the eye.  No FB known but had been out mowing grass yesterday and the day before.  Noted that started having these sx before mowing.  Sx had quick onset when he rubbed his eye.  L eye only.  No vision loss. No discharge.    Meds, vitals, and allergies reviewed.   ROS: See HPI.  Otherwise, noncontributory.  nad ncat L eyelids slightly irritated.  R lids wnl.  Normal wood's lamp exam.  No FB seen.  No abrasion seen PERRL EOMI

## 2014-09-22 ENCOUNTER — Telehealth: Payer: Self-pay | Admitting: Neurology

## 2014-09-22 DIAGNOSIS — G20 Parkinson's disease: Secondary | ICD-10-CM

## 2014-09-22 NOTE — Telephone Encounter (Signed)
Pt wife Freida Busman called and states that the patient has been released to go back to PT and she would like to talk to someone about nightmares that he is having please call her at [phone]

## 2014-09-22 NOTE — Telephone Encounter (Signed)
I'm a little leary about trying klonopin b/c of his EDS, but given falling out of bed try 0.5 mg but only 1/2 tablet q hs.  They should look for the railings that you put under the mattress to put on bed

## 2014-09-22 NOTE — Telephone Encounter (Signed)
Spoke with patient's wife and she states patient released to PT from Dr Trenton Gammon. Needs a new order. Order placed and routed to [facility] for outpatient PT. They will contact them to set this up.   Wife also states patient has been having very animated nightmares for quite awhile. He is not waking up with them but having a lot of arm swinging and they had to put up a bed rail because he has fallen out of bed a couple times. He has never used clonazepam. These don't seem to bother him but they bother his wife. Please advise.

## 2014-09-23 MED ORDER — CLONAZEPAM 0.5 MG PO TABS: 0.2500 mg | ORAL_TABLET | Freq: Every day | ORAL | Status: DC

## 2014-09-23 NOTE — Telephone Encounter (Signed)
Spoke with wife and made her aware. Also states patient stopped Nuvigil but doing well off of it. Will call with any further questions or problems.

## 2014-09-23 NOTE — Telephone Encounter (Signed)
LMOM making patient/wife aware of medication being sent to pharmacy. Will call with any questions.

## 2014-10-01 ENCOUNTER — Ambulatory Visit (INDEPENDENT_AMBULATORY_CARE_PROVIDER_SITE_OTHER): Payer: Medicare PPO | Admitting: Family Medicine

## 2014-10-01 ENCOUNTER — Encounter: Payer: Self-pay | Admitting: Family Medicine

## 2014-10-01 VITALS — BP 110/70 | HR 60 | Temp 98.4°F | Wt 177.8 lb

## 2014-10-01 DIAGNOSIS — G20 Parkinson's disease: Secondary | ICD-10-CM

## 2014-10-01 DIAGNOSIS — L989 Disorder of the skin and subcutaneous tissue, unspecified: Secondary | ICD-10-CM

## 2014-10-01 NOTE — Progress Notes (Signed)
Pre visit review using our clinic review tool, if applicable. No additional management support is needed unless otherwise documented below in the visit note.  Plantar L foot lesion, at the midpoint of the arch.  Not ttp.  Noted incidentally.    He was acting out his dreams, likely parkinson's related.  Tried klonopin, it helped with movement at night, but his mood is worse in the meantime- "I couldn't care about doing anything."  No SI/HI.   He's only on 0.25mg  klonopin.  He would like to go to counseling, d/w pt.    Meds, vitals, and allergies reviewed.   ROS: See HPI.  Otherwise, noncontributory.  nad but affect is flat.    Mmm rrr ctab L plantar foot lesion, 75mm, not warty, appears to be deep to the skin.  Not ttp.

## 2014-10-01 NOTE — Patient Instructions (Addendum)
If the foot lesion gets tender or bigger, then let me know.   Ask about seeing Dr. Rexene Edison or Clint Bolder.   Stop the klonopin for now and let me talk to Dr. Carles Collet.  Take care.  Glad to see you.

## 2014-10-02 ENCOUNTER — Other Ambulatory Visit: Payer: Self-pay | Admitting: Neurosurgery

## 2014-10-02 DIAGNOSIS — M4317 Spondylolisthesis, lumbosacral region: Secondary | ICD-10-CM

## 2014-10-02 DIAGNOSIS — L989 Disorder of the skin and subcutaneous tissue, unspecified: Secondary | ICD-10-CM | POA: Insufficient documentation

## 2014-10-02 NOTE — Assessment & Plan Note (Signed)
No sx, wouldn't treat at this point.  Could be a small cyst vs scar tissue on the plantar fascia.  dw pt.  He agrees.

## 2014-10-02 NOTE — Assessment & Plan Note (Signed)
Stop BZD for now, will update me re: mood, he'll check on counseling appointment and I'll ask neuro about options re: sleep tx.  No need to taper BZD at this point.

## 2014-10-05 ENCOUNTER — Telehealth: Payer: Self-pay | Admitting: Family Medicine

## 2014-10-05 NOTE — Telephone Encounter (Signed)
Please get update on patient re: mood and sleep since he stopped the klonopin.  I talked with Dr. Carles Collet but I need an update on patient first.  Thanks.

## 2014-10-06 NOTE — Telephone Encounter (Signed)
Patient says he had to stop the medication because of diarrhea and he is sleeping okay without the medication and mood is okay.  Patient was not very forthcoming with information, seemed more interested in my knowing that the medication caused diarrhea.

## 2014-10-06 NOTE — Telephone Encounter (Signed)
If his sleep and mood are okay, then I would continue as is and then update me as needed. I wouldn't change anything at this point.  Thanks.

## 2014-10-07 ENCOUNTER — Encounter: Payer: Medicare PPO | Admitting: Occupational Therapy

## 2014-10-08 ENCOUNTER — Ambulatory Visit: Payer: Medicare PPO | Attending: Neurology | Admitting: Occupational Therapy

## 2014-10-08 ENCOUNTER — Encounter: Payer: Self-pay | Admitting: Occupational Therapy

## 2014-10-08 ENCOUNTER — Telehealth: Payer: Self-pay | Admitting: Neurology

## 2014-10-08 DIAGNOSIS — R296 Repeated falls: Secondary | ICD-10-CM

## 2014-10-08 DIAGNOSIS — R279 Unspecified lack of coordination: Secondary | ICD-10-CM

## 2014-10-08 DIAGNOSIS — M6281 Muscle weakness (generalized): Secondary | ICD-10-CM | POA: Diagnosis not present

## 2014-10-08 DIAGNOSIS — R460 Very low level of personal hygiene: Secondary | ICD-10-CM | POA: Diagnosis not present

## 2014-10-08 DIAGNOSIS — R262 Difficulty in walking, not elsewhere classified: Secondary | ICD-10-CM | POA: Diagnosis present

## 2014-10-08 DIAGNOSIS — Z741 Need for assistance with personal care: Secondary | ICD-10-CM

## 2014-10-08 NOTE — Telephone Encounter (Signed)
[name] from [facility] called and wanted to know if we could change the order for PT to OT/Dawn CB#[phone]

## 2014-10-09 ENCOUNTER — Other Ambulatory Visit: Payer: Self-pay | Admitting: *Deleted

## 2014-10-09 DIAGNOSIS — G20 Parkinson's disease: Secondary | ICD-10-CM

## 2014-10-09 NOTE — Telephone Encounter (Signed)
Patient advised.

## 2014-10-09 NOTE — Telephone Encounter (Signed)
New order placed

## 2014-10-10 ENCOUNTER — Ambulatory Visit
Admission: RE | Admit: 2014-10-10 | Discharge: 2014-10-10 | Disposition: A | Discharge status: Home / Self Care | Payer: Medicare PPO | Source: Ambulatory Visit | Attending: Neurosurgery | Admitting: Neurosurgery

## 2014-10-10 DIAGNOSIS — M4317 Spondylolisthesis, lumbosacral region: Secondary | ICD-10-CM

## 2014-10-10 DIAGNOSIS — M5136 Other intervertebral disc degeneration, lumbar region: Secondary | ICD-10-CM | POA: Insufficient documentation

## 2014-10-10 NOTE — Therapy (Signed)
[place] MAIN [facility] SERVICES [place], [place], [address] Phone: [phone]   Fax:  [phone]  Occupational Therapy Evaluation  Patient Details  Name: [name] MRN: [id number] Date of Birth: 05-12-1950 Referring Provider:  Ludwig Clarks, DO  Encounter Date: 10/08/2014    Past Medical History  Diagnosis Date  . Parkinson's disease     dx'ed 15 years ago  . PTSD (post-traumatic stress disorder)   . Bradycardia   . Hypertension     treated with HCTZ  . Sleep apnea     doesn't use C-pap  . Varicose veins   . Cancer 2013    skin cancer  . Arthritis   . History of chicken pox   . GERD (gastroesophageal reflux disease)   . Dysrhythmia     chronic slow heart rate  . Depression     ptsd  . History of kidney stones   . Headache(784.0)     tension headaches non recent  . Shortness of breath dyspnea     Past Surgical History  Procedure Laterality Date  . Cyst removed       from lip as a child  . Skin cancer removed      from ears,   12 lft arm  rt leg 15  . Lumbar laminectomy/decompression microdiscectomy Bilateral 12/14/2012    Procedure: Bilateral lumbar three-four, four-five decompressive laminotomy/foraminotomy;  Surgeon: Charlie Pitter, MD;  Location: [place] [facility];  Service: Neurosurgery;  Laterality: Bilateral;  . Subthalamic stimulator insertion Bilateral 12/06/2013    Procedure: SUBTHALAMIC STIMULATOR INSERTION;  Surgeon: Erline Levine, MD;  Location: [place] [facility];  Service: Neurosurgery;  Laterality: Bilateral;  Bilateral deep brain stimulator placement  . Pulse generator implant Bilateral 12/13/2013    Procedure: Bilateral implantable pulse generator placement;  Surgeon: Erline Levine, MD;  Location: [place] [facility];  Service: Neurosurgery;  Laterality: Bilateral;  Bilateral implantable pulse generator placement    There were no vitals filed for this visit.  Visit Diagnosis:  Difficulty walking - Plan: Ot plan  of care cert/re-cert  Muscle weakness (generalized) - Plan: Ot plan of care cert/re-cert  Lack of coordination - Plan: Ot plan of care cert/re-cert  Falls frequently - Plan: Ot plan of care cert/re-cert  Self-care deficit for dressing and grooming - Plan: Ot plan of care cert/re-cert         [facility] OT Assessment - 10/10/14 0847    Assessment   Diagnosis Parkinson's disease   Onset Date 04/19/99   Balance Screen   Has the patient fallen in the past 6 months Yes   How many times? 10   Has the patient had a decrease in activity level because of a fear of falling?  Yes   Is the patient reluctant to leave their home because of a fear of falling?  Yes   Home  Environment   Family/patient expects to be discharged to: Private residence   Living Arrangements Spouse/significant other   Available Help at Discharge Family   Type of [place] entrance   [place] Two level   Alternate Level Stairs - Number of Steps 15 steps down to split level   Bathroom Shower/Tub Tub/Shower unit   Shower/tub characteristics [place] bars - tub/shower;Cane - single point;Walker - 4 wheels   Lives With Spouse   Prior Function  Vocation On disability;Retired  retired from [employer], worked for [employer] in the past   ADL   Eating/Feeding Other (comment )   Grooming Shaving   Upper Body Bathing Modified independent   Lower Body Bathing Increased time   Upper Body Dressing Minimal assistance  difficulty with buttons and has a button hook   Lower Body Dressing Increased time  elastic shoe laces.   Mudlogger Modified independent   Transfers/Ambulation Related to ADL's uses cane at times especially to get in and out of the vehicle.   IADL   Shopping Shops independently for [employer] --  patient is responsible for outside yardwork   Meal Prep Able to complete simple cold meal and snack prep   Medication Management Takes responsibility if medication is prepared in advance in seperate dosage   Financial Management Requires supervision/minimal cuing   Mobility   Mobility Status History of falls   Written Expression   Handwriting Mild micrographia   Coordination   Gross Motor Movements are Fluid and Coordinated No   Fine Motor Movements are Fluid and Coordinated No   9 Hole Peg Test Right;Left   Right 9 Hole Peg Test 38   Left 9 Hole Peg Test 30   Tremors resting tremors in the evenings in the left hand and right shoulder, was worse before deep brain stimulator.        Clinical Impression Statement:   Patient is a 64 yo male diagnosed with Parkinson's disease and was referred by his physician for LSVT BIG program. Patient presents with decreased step length with gait patterns, decreased reciprocal arm swing, decreased balance, occasional hesitation with initiation of movement and turns, decreased coordination, muscle strength and slight tremors in his left UE which affect his ability to perform daily tasks. The patient is judged to be an excellent candidate for the LSVT BIG program. He would benefit from and was referred for the LSVT BIG program which is an intensive program designed specifically for Parkinson's patients with a focus on increasing amplitude and speed of movements, improving self care and daily tasks and providing patients with daily exercises to improve overall function. It is recommended that the patient receive the LSVT BIG program which is comprised of 16 intensive sessions (4 times per week for 4 weeks, one hour sessions). Prognosis for improvement is good based on his motivation and strong family support. LSVT BIG has been documented in the literature as efficacious for individuals with Parkinson's disease. Pt will benefit from skilled  therapeutic intervention in order to improve on the following deficits (Retired) Decreased strength; Decreased knowledge of use of DME; Difficulty walking; Decreased mobility; Decreased balance; Decreased safety awareness; Decreased coordination Rehab Potential Good Frequency 4x a week for 4 weeks (16 total treatment visits) OT interventions:  Self-care/ADL training; Personnel officer; Conservation officer, nature; Therapeutic exercises; Manual Therapy; Neuromuscular education; Patient/family education; DME and/or AE instruction; Balance training Consulted and Agree with Plan of Care  Patient and wife                        OT Long Term Goals - 10/08/14 1456    OT LONG TERM GOAL #1   Title Patient will improve gait speed and endurance and be able to walk 1300 feet in 6 minutes to negotiate around the home and community safely    Baseline  currently 1135 feet in 6 minutes   Time 4   Period Weeks   Status New   OT LONG TERM GOAL #2   Title Patient will complete HEP for maximal daily exercises with modified independence    Baseline does not have HEP currently   Time 4   Period Weeks   Status New   OT LONG TERM GOAL #3   Title Patient will transfer from sit to stand without the use of arms safely and independently from a variety of chairs/surfaces    Baseline patient has difficulty with sit to stand from low surfaces and tends to result in occasional falls   Time 4   Period Weeks   Status New   OT LONG TERM GOAL #4   Title Patient will complete buttons on shirts and pants with modified independence   Baseline patient has difficulty with buttons and requires assistance at times   Time 4   Period Weeks   Status New   OT LONG TERM GOAL #5   Title Patient will increase Berg Balance score by > 4 points to demonstrate decreased fall risk during functional activities   Baseline scored 48/56 this date but balance flucuates with day   Time 4   Period Weeks    Status New   Long Term Additional Goals   Additional Long Term Goals Yes   OT LONG TERM GOAL #6   Title Patient will demonstrate squatting to pick up item from floor safely and without loss of balance.   Baseline difficulty with consistent performance.   Time 4   Period Weeks   Status New   OT LONG TERM GOAL #7   Title Patient will demonstrate handwriting of name, address and phone number with 80% legibility.   Baseline micrographia and decreased legibility less than 50%   Time 4   Period Weeks   Status New               Problem List Patient Active Problem List   Diagnosis Date Noted  . Skin lesion 10/02/2014  . Irritation of eyelid 08/20/2014  . Dupuytren's contracture 08/20/2014  . Cough 07/21/2014  . SOM (serous otitis media) 07/21/2014  . Lumbar stenosis with neurogenic claudication 06/13/2014  . Preop cardiovascular exam 05/28/2014  . S/P deep brain stimulator placement 05/08/2014  . PD (Parkinson's disease) 05/08/2014  . Joint pain 01/22/2014  . Medicare annual wellness visit, initial 01/19/2014  . Advance care planning 01/19/2014  . Parkinson's disease 12/13/2013  . PTSD (post-traumatic stress disorder) 06/13/2013  . Erectile dysfunction 06/13/2013  . HLD (hyperlipidemia) 06/13/2013  . Hip pain 06/10/2013  . Pain in joint, shoulder region 06/10/2013  . Right leg swelling 06/03/2013  . Essential hypertension 06/03/2013  . Bradycardia by electrocardiogram 06/03/2013  . Obstructive sleep apnea 03/12/2013  . Spinal stenosis, lumbar region, with neurogenic claudication 12/14/2012   Achilles Dunk, OTR/L, CLT Kosha Jaquith 10/10/2014, 8:48 AM  [place], [place], [address] Phone: [phone]   Fax:  [phone]

## 2014-10-13 ENCOUNTER — Encounter: Payer: Self-pay | Admitting: Occupational Therapy

## 2014-10-13 ENCOUNTER — Ambulatory Visit: Payer: Medicare PPO | Admitting: Occupational Therapy

## 2014-10-13 DIAGNOSIS — R296 Repeated falls: Secondary | ICD-10-CM

## 2014-10-13 DIAGNOSIS — R262 Difficulty in walking, not elsewhere classified: Secondary | ICD-10-CM | POA: Diagnosis not present

## 2014-10-13 DIAGNOSIS — M6281 Muscle weakness (generalized): Secondary | ICD-10-CM

## 2014-10-13 DIAGNOSIS — R279 Unspecified lack of coordination: Secondary | ICD-10-CM

## 2014-10-14 NOTE — Therapy (Signed)
[place] MAIN [facility] SERVICES [address] [place], [place], [address] Phone: [phone]   Fax:  [phone]  Occupational Therapy Treatment  Patient Details  Name: [name] MRN: [id number] Date of Birth: 07-26-1950 Referring Provider:  Tonia Ghent, MD  Encounter Date: 10/13/2014      OT End of Session - 10/13/14 1639    Visit Number 2   Number of Visits 17   Date for OT Re-Evaluation 11/06/14   Authorization Type Medicare G codes 2   Authorization Time Period 10   OT Start Time 1000   OT Stop Time 1059   OT Time Calculation (min) 59 min   Activity Tolerance Patient tolerated treatment well   Behavior During Therapy [facility] for tasks assessed/performed      Past Medical History  Diagnosis Date  . Parkinson's disease     dx'ed 15 years ago  . PTSD (post-traumatic stress disorder)   . Bradycardia   . Hypertension     treated with HCTZ  . Sleep apnea     doesn't use C-pap  . Varicose veins   . Cancer 2013    skin cancer  . Arthritis   . History of chicken pox   . GERD (gastroesophageal reflux disease)   . Dysrhythmia     chronic slow heart rate  . Depression     ptsd  . History of kidney stones   . Headache(784.0)     tension headaches non recent  . Shortness of breath dyspnea     Past Surgical History  Procedure Laterality Date  . Cyst removed       from lip as a child  . Skin cancer removed      from ears,   12 lft arm  rt leg 15  . Lumbar laminectomy/decompression microdiscectomy Bilateral 12/14/2012    Procedure: Bilateral lumbar three-four, four-five decompressive laminotomy/foraminotomy;  Surgeon: Charlie Pitter, MD;  Location: [place] [facility];  Service: Neurosurgery;  Laterality: Bilateral;  . Subthalamic stimulator insertion Bilateral 12/06/2013    Procedure: SUBTHALAMIC STIMULATOR INSERTION;  Surgeon: Erline Levine, MD;  Location: [place] [facility];  Service: Neurosurgery;  Laterality: Bilateral;  Bilateral deep  brain stimulator placement  . Pulse generator implant Bilateral 12/13/2013    Procedure: Bilateral implantable pulse generator placement;  Surgeon: Erline Levine, MD;  Location: [place] [facility];  Service: Neurosurgery;  Laterality: Bilateral;  Bilateral implantable pulse generator placement    There were no vitals filed for this visit.  Visit Diagnosis:  Difficulty walking  Muscle weakness (generalized)  Lack of coordination  Falls frequently      Subjective Assessment - 10/13/14 1637    Subjective  Patient reports, "Is this going to help me get better, what's it going to do?"  Discussed the benefits of LSVT BIG and patient demos understanding.   Patient Stated Goals Patient reports he wants to improve his balance so he can do some of the things he would like to do such as fishing, mowing grass by the road and be able to negotiate stairs.    Currently in Pain? No/denies   Multiple Pain Sites No                      OT Treatments/Exercises (OP) - 10/13/14 1638    Neurological Re-education Exercises   Other Exercises 1 LSVT Daily Session Maximal Daily Exercises: Sustained movements are designed to rescale the amplitude of movement output for generalization to  daily functional activities. Performed as follows for 1 set of 10 repetitions each: Multi directional sustained movements- 1) Floor to ceiling, 2) Side to side. Multi directional Repetitive movements performed in standing and are designed to provide retraining effort needed for sustained muscle activation in tasks Performed as follows: 3) Step and reach forward, 4) Step and Reach Backwards, 5) Step and reach sideways, 6) Rock and reach forward/backward, 7) Rock and reach sideways. Sit to stand from mat table on lowest setting with cues for weight shift, technique and CGA for 5 reps for 2 sets. Patient seen for functional mobility tasks this date with emphasis on gait speed, length of steps with short distance ambulation, 200  feet for 3 trials without an assistive device.                  OT Education - 10/13/14 1638    Education provided Yes   Education Details LSVT BIG and benefits to participating in the program, Maximal Daily Exercises   Person(s) Educated Patient   Methods Explanation;Demonstration;Tactile cues;Verbal cues   Comprehension Verbalized understanding;Returned demonstration;Verbal cues required;Tactile cues required;Need further instruction             OT Long Term Goals - 10/08/14 1456    OT LONG TERM GOAL #1   Title Patient will improve gait speed and endurance and be able to walk 1300 feet in 6 minutes to negotiate around the home and community safely    Baseline currently 1135 feet in 6 minutes   Time 4   Period Weeks   Status New   OT LONG TERM GOAL #2   Title Patient will complete HEP for maximal daily exercises with modified independence    Baseline does not have HEP currently   Time 4   Period Weeks   Status New   OT LONG TERM GOAL #3   Title Patient will transfer from sit to stand without the use of arms safely and independently from a variety of chairs/surfaces    Baseline patient has difficulty with sit to stand from low surfaces and tends to result in occasional falls   Time 4   Period Weeks   Status New   OT LONG TERM GOAL #4   Title Patient will complete buttons on shirts and pants with modified independence   Baseline patient has difficulty with buttons and requires assistance at times   Time 4   Period Weeks   Status New   OT LONG TERM GOAL #5   Title Patient will increase Berg Balance score by > 4 points to demonstrate decreased fall risk during functional activities   Baseline scored 48/56 this date but balance flucuates with day   Time 4   Period Weeks   Status New   Long Term Additional Goals   Additional Long Term Goals Yes   OT LONG TERM GOAL #6   Title Patient will demonstrate squatting to pick up item from floor safely and without loss of  balance.   Baseline difficulty with consistent performance.   Time 4   Period Weeks   Status New   OT LONG TERM GOAL #7   Title Patient will demonstrate handwriting of name, address and phone number with 80% legibility.   Baseline micrographia and decreased legibility less than 50%   Time 4   Period Weeks   Status New               Plan - 10/13/14 1227    Clinical  Impression Statement Patient was seen this date for initial instruction on maximal daily exercises which is a core component of the LSVT BIG protocol.  Patient responded well with both verbal and tactile cues for technique to complete exercises.  Patient performed in the clinic this date and will need to progress to performing 2 times a day as a part of the program. Will plan to issue HEP with these exercises by the end of the week after patient can show competence in completing.     Pt will benefit from skilled therapeutic intervention in order to improve on the following deficits (Retired) Decreased strength;Decreased knowledge of use of DME;Difficulty walking;Decreased mobility;Decreased balance;Decreased safety awareness;Decreased coordination   Rehab Potential Good   OT Frequency 4x / week   OT Duration 4 weeks   OT Treatment/Interventions Self-care/ADL training;Gait Training;Stair Training;Functional Furniture conservator/restorer;Therapeutic exercises;Manual Therapy;Neuromuscular education;Patient/family education;DME and/or AE instruction;Balance training   OT Home Exercise Plan Will issue HEP by the end of this week.   Consulted and Agree with Plan of Care Patient        Problem List Patient Active Problem List   Diagnosis Date Noted  . Skin lesion 10/02/2014  . Irritation of eyelid 08/20/2014  . Dupuytren's contracture 08/20/2014  . Cough 07/21/2014  . SOM (serous otitis media) 07/21/2014  . Lumbar stenosis with neurogenic claudication 06/13/2014  . Preop cardiovascular exam 05/28/2014  . S/P deep brain stimulator  placement 05/08/2014  . PD (Parkinson's disease) 05/08/2014  . Joint pain 01/22/2014  . Medicare annual wellness visit, initial 01/19/2014  . Advance care planning 01/19/2014  . Parkinson's disease 12/13/2013  . PTSD (post-traumatic stress disorder) 06/13/2013  . Erectile dysfunction 06/13/2013  . HLD (hyperlipidemia) 06/13/2013  . Hip pain 06/10/2013  . Pain in joint, shoulder region 06/10/2013  . Right leg swelling 06/03/2013  . Essential hypertension 06/03/2013  . Bradycardia by electrocardiogram 06/03/2013  . Obstructive sleep apnea 03/12/2013  . Spinal stenosis, lumbar region, with neurogenic claudication 12/14/2012   Achilles Dunk, OTR/L, CLT Cortnee Steinmiller 10/14/2014, 12:31 PM  [place] MAIN [facility] SERVICES [address] [place], [place], [address] Phone: [phone]   Fax:  [phone]

## 2014-10-15 ENCOUNTER — Ambulatory Visit: Payer: Medicare PPO | Admitting: Occupational Therapy

## 2014-10-15 ENCOUNTER — Encounter: Payer: Self-pay | Admitting: Occupational Therapy

## 2014-10-15 DIAGNOSIS — R262 Difficulty in walking, not elsewhere classified: Secondary | ICD-10-CM

## 2014-10-15 DIAGNOSIS — R460 Very low level of personal hygiene: Secondary | ICD-10-CM

## 2014-10-15 DIAGNOSIS — Z741 Need for assistance with personal care: Secondary | ICD-10-CM

## 2014-10-15 DIAGNOSIS — R279 Unspecified lack of coordination: Secondary | ICD-10-CM

## 2014-10-15 DIAGNOSIS — M6281 Muscle weakness (generalized): Secondary | ICD-10-CM

## 2014-10-15 DIAGNOSIS — R296 Repeated falls: Secondary | ICD-10-CM

## 2014-10-16 ENCOUNTER — Ambulatory Visit (INDEPENDENT_AMBULATORY_CARE_PROVIDER_SITE_OTHER): Payer: Medicare PPO | Admitting: Psychology

## 2014-10-16 ENCOUNTER — Encounter: Payer: Self-pay | Admitting: Occupational Therapy

## 2014-10-16 ENCOUNTER — Ambulatory Visit: Payer: Medicare PPO | Admitting: Occupational Therapy

## 2014-10-16 DIAGNOSIS — F321 Major depressive disorder, single episode, moderate: Secondary | ICD-10-CM | POA: Diagnosis not present

## 2014-10-16 DIAGNOSIS — Z741 Need for assistance with personal care: Secondary | ICD-10-CM

## 2014-10-16 DIAGNOSIS — R460 Very low level of personal hygiene: Secondary | ICD-10-CM

## 2014-10-16 DIAGNOSIS — R262 Difficulty in walking, not elsewhere classified: Secondary | ICD-10-CM | POA: Diagnosis not present

## 2014-10-16 DIAGNOSIS — M6281 Muscle weakness (generalized): Secondary | ICD-10-CM

## 2014-10-16 DIAGNOSIS — R279 Unspecified lack of coordination: Secondary | ICD-10-CM

## 2014-10-16 DIAGNOSIS — R296 Repeated falls: Secondary | ICD-10-CM

## 2014-10-16 NOTE — Therapy (Signed)
[place] MAIN [facility] SERVICES [address] [place], [place], [address] Phone: [phone]   Fax:  [phone]  Occupational Therapy Treatment  Patient Details  Name: [name] MRN: [id number] Date of Birth: [birth year] Referring Provider:  Tonia Ghent, MD  Encounter Date: 10/16/2014      OT End of Session - 10/16/14 1544    Visit Number 4   Number of Visits 17   Date for OT Re-Evaluation 11/06/14   Authorization Type Medicare G codes 4   Authorization Time Period 10   OT Start Time 0800   OT Stop Time 0901   OT Time Calculation (min) 61 min   Activity Tolerance Patient tolerated treatment well   Behavior During Therapy [facility] for tasks assessed/performed      Past Medical History  Diagnosis Date  . Parkinson's disease     dx'ed 15 years ago  . PTSD (post-traumatic stress disorder)   . Bradycardia   . Hypertension     treated with HCTZ  . Sleep apnea     doesn't use C-pap  . Varicose veins   . Cancer 2013    skin cancer  . Arthritis   . History of chicken pox   . GERD (gastroesophageal reflux disease)   . Dysrhythmia     chronic slow heart rate  . Depression     ptsd  . History of kidney stones   . Headache(784.0)     tension headaches non recent  . Shortness of breath dyspnea     Past Surgical History  Procedure Laterality Date  . Cyst removed       from lip as a child  . Skin cancer removed      from ears,   12 lft arm  rt leg 15  . Lumbar laminectomy/decompression microdiscectomy Bilateral 12/14/2012    Procedure: Bilateral lumbar three-four, four-five decompressive laminotomy/foraminotomy;  Surgeon: Charlie Pitter, MD;  Location: [place] [facility];  Service: Neurosurgery;  Laterality: Bilateral;  . Subthalamic stimulator insertion Bilateral 12/06/2013    Procedure: SUBTHALAMIC STIMULATOR INSERTION;  Surgeon: Erline Levine, MD;  Location: [place] [facility];  Service: Neurosurgery;  Laterality: Bilateral;  Bilateral deep  brain stimulator placement  . Pulse generator implant Bilateral 12/13/2013    Procedure: Bilateral implantable pulse generator placement;  Surgeon: Erline Levine, MD;  Location: [place] [facility];  Service: Neurosurgery;  Laterality: Bilateral;  Bilateral implantable pulse generator placement    There were no vitals filed for this visit.  Visit Diagnosis:  Difficulty walking  Muscle weakness (generalized)  Lack of coordination  Falls frequently  Self-care deficit for dressing and grooming      Subjective Assessment - 10/16/14 1542    Subjective  No compliants this date, has another appointment to get to today after OT.   Patient is accompained by: Family member   Patient Stated Goals Patient reports he wants to improve his balance so he can do some of the things he would like to do such as fishing, mowing grass by the road and be able to negotiate stairs.    Currently in Pain? No/denies   Pain Score 0-No pain                      OT Treatments/Exercises (OP) - 10/16/14 1542    Neurological Re-education Exercises   Other Exercises 1 LSVT Daily Session Maximal Daily Exercises: Sustained movements are designed to rescale the amplitude of movement output for  generalization to daily functional activities. Performed as follows for 1 set of 10 repetitions each: Multi directional sustained movements- 1) Floor to ceiling, 2) Side to side. Multi directional Repetitive movements performed in standing and are designed to provide retraining effort needed for sustained muscle activation in tasks Performed as follows: 3) Step and reach forward, 4) Step and Reach Backwards, 5) Step and reach sideways, 6) Rock and reach forward/backward, 7) Rock and reach sideways. Sit to stand from mat table on lowest setting with cues for weight shift, technique and CGA for 5 reps for 2 sets. Patient seen for functional mobility tasks this date with emphasis on gait speed, length of steps with short distance  ambulation, 275 feet for 3 trials without an assistive device.  Compiled a list of functional component tasks:  sit to stand, squatting to pick up objects, handwriting, and moving in small spaces.                 OT Education - 10/16/14 1544    Education provided Yes   Education Details Maximal Daily exercises, functional component tasks.   Person(s) Educated Patient   Methods Explanation;Demonstration;Verbal cues   Comprehension Verbalized understanding;Returned demonstration;Verbal cues required             OT Long Term Goals - 10/08/14 1456    OT LONG TERM GOAL #1   Title Patient will improve gait speed and endurance and be able to walk 1300 feet in 6 minutes to negotiate around the home and community safely    Baseline currently 1135 feet in 6 minutes   Time 4   Period Weeks   Status New   OT LONG TERM GOAL #2   Title Patient will complete HEP for maximal daily exercises with modified independence    Baseline does not have HEP currently   Time 4   Period Weeks   Status New   OT LONG TERM GOAL #3   Title Patient will transfer from sit to stand without the use of arms safely and independently from a variety of chairs/surfaces    Baseline patient has difficulty with sit to stand from low surfaces and tends to result in occasional falls   Time 4   Period Weeks   Status New   OT LONG TERM GOAL #4   Title Patient will complete buttons on shirts and pants with modified independence   Baseline patient has difficulty with buttons and requires assistance at times   Time 4   Period Weeks   Status New   OT LONG TERM GOAL #5   Title Patient will increase Berg Balance score by > 4 points to demonstrate decreased fall risk during functional activities   Baseline scored 48/56 this date but balance flucuates with day   Time 4   Period Weeks   Status New   Long Term Additional Goals   Additional Long Term Goals Yes   OT LONG TERM GOAL #6   Title Patient will demonstrate  squatting to pick up item from floor safely and without loss of balance.   Baseline difficulty with consistent performance.   Time 4   Period Weeks   Status New   OT LONG TERM GOAL #7   Title Patient will demonstrate handwriting of name, address and phone number with 80% legibility.   Baseline micrographia and decreased legibility less than 50%   Time 4   Period Weeks   Status New  Plan - 10/16/14 1546    Clinical Impression Statement Patient continues to progress with exercises and functional mobility this week.  Issued HEP for maximal daily exercises to start performing one set at home on the days he comes to therapy and 2 sets on the days he does not.  He demos understanding.  He requires CGA at times for balance in standing with exercise but will have his wife to help him or use a chair to modifiy task.     Pt will benefit from skilled therapeutic intervention in order to improve on the following deficits (Retired) Decreased strength;Decreased knowledge of use of DME;Difficulty walking;Decreased mobility;Decreased balance;Decreased safety awareness;Decreased coordination   Rehab Potential Good   OT Frequency 4x / week   OT Duration 4 weeks   OT Treatment/Interventions Self-care/ADL training;Gait Training;Stair Training;Functional Furniture conservator/restorer;Therapeutic exercises;Manual Therapy;Neuromuscular education;Patient/family education;DME and/or AE instruction;Balance training   OT Home Exercise Plan Issued HEP with maximal daily exercises with written/pictorial    Consulted and Agree with Plan of Care Patient        Problem List Patient Active Problem List   Diagnosis Date Noted  . Skin lesion 10/02/2014  . Irritation of eyelid 08/20/2014  . Dupuytren's contracture 08/20/2014  . Cough 07/21/2014  . SOM (serous otitis media) 07/21/2014  . Lumbar stenosis with neurogenic claudication 06/13/2014  . Preop cardiovascular exam 05/28/2014  . S/P deep brain  stimulator placement 05/08/2014  . PD (Parkinson's disease) 05/08/2014  . Joint pain 01/22/2014  . Medicare annual wellness visit, initial 01/19/2014  . Advance care planning 01/19/2014  . Parkinson's disease 12/13/2013  . PTSD (post-traumatic stress disorder) 06/13/2013  . Erectile dysfunction 06/13/2013  . HLD (hyperlipidemia) 06/13/2013  . Hip pain 06/10/2013  . Pain in joint, shoulder region 06/10/2013  . Right leg swelling 06/03/2013  . Essential hypertension 06/03/2013  . Bradycardia by electrocardiogram 06/03/2013  . Obstructive sleep apnea 03/12/2013  . Spinal stenosis, lumbar region, with neurogenic claudication 12/14/2012   Achilles Dunk, OTR/L, CLT Reni Hausner 10/16/2014, 6:09 PM  [place] MAIN [facility] SERVICES [address] [place], [place], [address] Phone: [phone]   Fax:  [phone]

## 2014-10-16 NOTE — Therapy (Signed)
[place] MAIN [facility] SERVICES [address] [place], [place], [address] Phone: [phone]   Fax:  [phone]  Occupational Therapy Treatment  Patient Details  Name: [name] MRN: [id number] Date of Birth: 03/12/1951 Referring Provider:  Tonia Ghent, MD  Encounter Date: 10/15/2014      OT End of Session - 10/15/14 1714    Visit Number 3   Number of Visits 17   Date for OT Re-Evaluation 11/06/14   Authorization Type Medicare G codes 3   Authorization Time Period 10   OT Start Time 1000   OT Stop Time 1100   OT Time Calculation (min) 60 min   Activity Tolerance Patient tolerated treatment well   Behavior During Therapy [facility] for tasks assessed/performed      Past Medical History  Diagnosis Date  . Parkinson's disease     dx'ed 15 years ago  . PTSD (post-traumatic stress disorder)   . Bradycardia   . Hypertension     treated with HCTZ  . Sleep apnea     doesn't use C-pap  . Varicose veins   . Cancer 2013    skin cancer  . Arthritis   . History of chicken pox   . GERD (gastroesophageal reflux disease)   . Dysrhythmia     chronic slow heart rate  . Depression     ptsd  . History of kidney stones   . Headache(784.0)     tension headaches non recent  . Shortness of breath dyspnea     Past Surgical History  Procedure Laterality Date  . Cyst removed       from lip as a child  . Skin cancer removed      from ears,   12 lft arm  rt leg 15  . Lumbar laminectomy/decompression microdiscectomy Bilateral 12/14/2012    Procedure: Bilateral lumbar three-four, four-five decompressive laminotomy/foraminotomy;  Surgeon: Charlie Pitter, MD;  Location: [place] [facility];  Service: Neurosurgery;  Laterality: Bilateral;  . Subthalamic stimulator insertion Bilateral 12/06/2013    Procedure: SUBTHALAMIC STIMULATOR INSERTION;  Surgeon: Erline Levine, MD;  Location: [place] [facility];  Service: Neurosurgery;  Laterality: Bilateral;  Bilateral deep  brain stimulator placement  . Pulse generator implant Bilateral 12/13/2013    Procedure: Bilateral implantable pulse generator placement;  Surgeon: Erline Levine, MD;  Location: [place] [facility];  Service: Neurosurgery;  Laterality: Bilateral;  Bilateral implantable pulse generator placement    There were no vitals filed for this visit.  Visit Diagnosis:  Difficulty walking  Muscle weakness (generalized)  Lack of coordination  Falls frequently  Self-care deficit for dressing and grooming      Subjective Assessment - 10/15/14 1712    Subjective  Patient reports he was a little sore in his left arm after exercises yesterday but it feels ok today.  Ready to get started. "I am coming in early tomorrow since I have another appointment to go to at 10:00."   Patient Stated Goals Patient reports he wants to improve his balance so he can do some of the things he would like to do such as fishing, mowing grass by the road and be able to negotiate stairs.    Currently in Pain? Yes   Pain Score 4    Pain Location Hip   Pain Orientation Left   Pain Descriptors / Indicators Aching   Pain Type Acute pain   Pain Onset In the past 7 days   Pain Frequency Occasional  OT Treatments/Exercises (OP) - 10/15/14 1713    Neurological Re-education Exercises   Other Exercises 1 LSVT Daily Session Maximal Daily Exercises: Sustained movements are designed to rescale the amplitude of movement output for generalization to daily functional activities. Performed as follows for 1 set of 10 repetitions each: Multi directional sustained movements- 1) Floor to ceiling, 2) Side to side. Multi directional Repetitive movements performed in standing and are designed to provide retraining effort needed for sustained muscle activation in tasks Performed as follows: 3) Step and reach forward, 4) Step and Reach Backwards, 5) Step and reach sideways, 6) Rock and reach forward/backward, 7) Rock and  reach sideways. Sit to stand from mat table on lowest setting with cues for weight shift, technique and CGA for 5 reps for 2 sets. Patient seen for functional mobility tasks this date with emphasis on gait speed, length of steps with short distance ambulation, 275 feet for 3 trials without an assistive device.   Discussed the need to establish 4 more functional component tasks which we will start by the end of the week.                OT Education - 10/15/14 1714    Education provided Yes   Person(s) Educated Patient   Methods Explanation;Demonstration;Verbal cues;Tactile cues   Comprehension Verbal cues required;Returned demonstration;Verbalized understanding;Tactile cues required;Need further instruction             OT Long Term Goals - 10/08/14 1456    OT LONG TERM GOAL #1   Title Patient will improve gait speed and endurance and be able to walk 1300 feet in 6 minutes to negotiate around the home and community safely    Baseline currently 1135 feet in 6 minutes   Time 4   Period Weeks   Status New   OT LONG TERM GOAL #2   Title Patient will complete HEP for maximal daily exercises with modified independence    Baseline does not have HEP currently   Time 4   Period Weeks   Status New   OT LONG TERM GOAL #3   Title Patient will transfer from sit to stand without the use of arms safely and independently from a variety of chairs/surfaces    Baseline patient has difficulty with sit to stand from low surfaces and tends to result in occasional falls   Time 4   Period Weeks   Status New   OT LONG TERM GOAL #4   Title Patient will complete buttons on shirts and pants with modified independence   Baseline patient has difficulty with buttons and requires assistance at times   Time 4   Period Weeks   Status New   OT LONG TERM GOAL #5   Title Patient will increase Berg Balance score by > 4 points to demonstrate decreased fall risk during functional activities   Baseline  scored 48/56 this date but balance flucuates with day   Time 4   Period Weeks   Status New   Long Term Additional Goals   Additional Long Term Goals Yes   OT LONG TERM GOAL #6   Title Patient will demonstrate squatting to pick up item from floor safely and without loss of balance.   Baseline difficulty with consistent performance.   Time 4   Period Weeks   Status New   OT LONG TERM GOAL #7   Title Patient will demonstrate handwriting of name, address and phone number with 80% legibility.   Baseline  micrographia and decreased legibility less than 50%   Time 4   Period Weeks   Status New               Plan - 10/15/14 1714    Clinical Impression Statement Patient was not able to recall any specific exercises from the session before but was able to complete with continued instruction from therapist along with verbal cues, tactile cues and CGA for exercises in standing for balance.  Patient to think more about the component tasks and establish by the end of the week.  Therapist will provide information packet of maximal daily exercises to patient next session to start performing an additional set each day , starting tomorrow.    Pt will benefit from skilled therapeutic intervention in order to improve on the following deficits (Retired) Decreased strength;Decreased knowledge of use of DME;Difficulty walking;Decreased mobility;Decreased balance;Decreased safety awareness;Decreased coordination   Rehab Potential Good   OT Frequency 4x / week   OT Duration 4 weeks   OT Treatment/Interventions Self-care/ADL training;Gait Training;Stair Training;Functional Furniture conservator/restorer;Therapeutic exercises;Manual Therapy;Neuromuscular education;Patient/family education;DME and/or AE instruction;Balance training   OT Home Exercise Plan Will issue HEP by the end of this week.   Consulted and Agree with Plan of Care Patient        Problem List Patient Active Problem List   Diagnosis Date Noted   . Skin lesion 10/02/2014  . Irritation of eyelid 08/20/2014  . Dupuytren's contracture 08/20/2014  . Cough 07/21/2014  . SOM (serous otitis media) 07/21/2014  . Lumbar stenosis with neurogenic claudication 06/13/2014  . Preop cardiovascular exam 05/28/2014  . S/P deep brain stimulator placement 05/08/2014  . PD (Parkinson's disease) 05/08/2014  . Joint pain 01/22/2014  . Medicare annual wellness visit, initial 01/19/2014  . Advance care planning 01/19/2014  . Parkinson's disease 12/13/2013  . PTSD (post-traumatic stress disorder) 06/13/2013  . Erectile dysfunction 06/13/2013  . HLD (hyperlipidemia) 06/13/2013  . Hip pain 06/10/2013  . Pain in joint, shoulder region 06/10/2013  . Right leg swelling 06/03/2013  . Essential hypertension 06/03/2013  . Bradycardia by electrocardiogram 06/03/2013  . Obstructive sleep apnea 03/12/2013  . Spinal stenosis, lumbar region, with neurogenic claudication 12/14/2012   Achilles Dunk, OTR/L, CLT  Anarie Kalish 10/16/2014, 10:52 AM  [place], [place], [address] Phone: [phone]   Fax:  [phone]

## 2014-10-17 ENCOUNTER — Encounter: Payer: Self-pay | Admitting: Occupational Therapy

## 2014-10-17 ENCOUNTER — Ambulatory Visit: Payer: Medicare PPO | Attending: Neurology | Admitting: Occupational Therapy

## 2014-10-17 DIAGNOSIS — R460 Very low level of personal hygiene: Secondary | ICD-10-CM | POA: Insufficient documentation

## 2014-10-17 DIAGNOSIS — M6281 Muscle weakness (generalized): Secondary | ICD-10-CM | POA: Diagnosis present

## 2014-10-17 DIAGNOSIS — R296 Repeated falls: Secondary | ICD-10-CM

## 2014-10-17 DIAGNOSIS — R279 Unspecified lack of coordination: Secondary | ICD-10-CM | POA: Diagnosis present

## 2014-10-17 DIAGNOSIS — R262 Difficulty in walking, not elsewhere classified: Secondary | ICD-10-CM | POA: Diagnosis present

## 2014-10-17 DIAGNOSIS — Z741 Need for assistance with personal care: Secondary | ICD-10-CM

## 2014-10-17 NOTE — Therapy (Signed)
[place] MAIN [facility] SERVICES [address] [place], [place], [address] Phone: [phone]   Fax:  [phone]  Occupational Therapy Treatment  Patient Details  Name: [name] MRN: [id number] Date of Birth: 14-Dec-1950 Referring Provider:  Ludwig Clarks, DO  Encounter Date: 10/17/2014      OT End of Session - 10/17/14 1011    Visit Number 5   Number of Visits 17   Date for OT Re-Evaluation 11/06/14   Authorization Type Medicare G codes 5   Authorization Time Period 10   OT Start Time 0901   OT Stop Time 1002   OT Time Calculation (min) 61 min   Activity Tolerance Patient tolerated treatment well   Behavior During Therapy [facility] for tasks assessed/performed      Past Medical History  Diagnosis Date  . Parkinson's disease     dx'ed 15 years ago  . PTSD (post-traumatic stress disorder)   . Bradycardia   . Hypertension     treated with HCTZ  . Sleep apnea     doesn't use C-pap  . Varicose veins   . Cancer 2013    skin cancer  . Arthritis   . History of chicken pox   . GERD (gastroesophageal reflux disease)   . Dysrhythmia     chronic slow heart rate  . Depression     ptsd  . History of kidney stones   . Headache(784.0)     tension headaches non recent  . Shortness of breath dyspnea     Past Surgical History  Procedure Laterality Date  . Cyst removed       from lip as a child  . Skin cancer removed      from ears,   12 lft arm  rt leg 15  . Lumbar laminectomy/decompression microdiscectomy Bilateral 12/14/2012    Procedure: Bilateral lumbar three-four, four-five decompressive laminotomy/foraminotomy;  Surgeon: Charlie Pitter, MD;  Location: [place] [facility];  Service: Neurosurgery;  Laterality: Bilateral;  . Subthalamic stimulator insertion Bilateral 12/06/2013    Procedure: SUBTHALAMIC STIMULATOR INSERTION;  Surgeon: Erline Levine, MD;  Location: [place] [facility];  Service: Neurosurgery;  Laterality: Bilateral;  Bilateral deep  brain stimulator placement  . Pulse generator implant Bilateral 12/13/2013    Procedure: Bilateral implantable pulse generator placement;  Surgeon: Erline Levine, MD;  Location: [place] [facility];  Service: Neurosurgery;  Laterality: Bilateral;  Bilateral implantable pulse generator placement    There were no vitals filed for this visit.  Visit Diagnosis:  Difficulty walking  Muscle weakness (generalized)  Falls frequently  Lack of coordination  Self-care deficit for dressing and grooming      Subjective Assessment - 10/17/14 0902    Subjective  Patient reports he had company yesterday and totally forgot to do another set of exercises last night. Reports some mild pain in the right hip when he steps a certain way.     Patient is accompained by: Family member   Patient Stated Goals Patient reports he wants to improve his balance so he can do some of the things he would like to do such as fishing, mowing grass by the road and be able to negotiate stairs.    Currently in Pain? Yes  only when he steps a certain way, not constant   Pain Score 5    Pain Location Hip   Pain Orientation Right   Pain Descriptors / Indicators Aching;Burning   Pain Type Chronic pain   Pain  Onset More than a month ago   Pain Frequency Intermittent   Pain Relieving Factors ibuprofen   Multiple Pain Sites No                      OT Treatments/Exercises (OP) - 10/17/14 1007    ADLs   ADL Comments Patient seen this date for functional component tasks:  sit to stand, squatting to pick up items from the floor, turning in small spaces, handwriting and managing the stairs.  Cues for tasks to produce larger scale movements and CGA for squatting for balance and safety.   Neurological Re-education Exercises   Other Exercises 1 LSVT Daily Session Maximal Daily Exercises: Sustained movements are designed to rescale the amplitude of movement output for generalization to daily functional activities. Performed as  follows for 1 set of 10 repetitions each: Multi directional sustained movements- 1) Floor to ceiling, 2) Side to side. Multi directional Repetitive movements performed in standing and are designed to provide retraining effort needed for sustained muscle activation in tasks Performed as follows: 3) Step and reach forward, 4) Step and Reach Backwards, 5) Step and reach sideways, 6) Rock and reach forward/backward, 7) Rock and reach sideways. Sit to stand from mat table on lowest setting with cues for weight shift, technique and CGA for 5 reps for 2 sets. Patient seen for functional mobility tasks this date with emphasis on gait speed, length of steps with short distance ambulation, 500 feet for 2 trials without an assistive device. Compiled a list of functional component tasks: sit to stand, squatting to pick up objects, handwriting, and moving in small spaces.                 OT Education - 10/17/14 1010    Education provided Yes   Education Details HEP, maximal daily exercises   Person(s) Educated Patient;Spouse   Methods Explanation;Demonstration;Verbal cues   Comprehension Verbalized understanding;Returned demonstration;Verbal cues required             OT Long Term Goals - 10/08/14 1456    OT LONG TERM GOAL #1   Title Patient will improve gait speed and endurance and be able to walk 1300 feet in 6 minutes to negotiate around the home and community safely    Baseline currently 1135 feet in 6 minutes   Time 4   Period Weeks   Status New   OT LONG TERM GOAL #2   Title Patient will complete HEP for maximal daily exercises with modified independence    Baseline does not have HEP currently   Time 4   Period Weeks   Status New   OT LONG TERM GOAL #3   Title Patient will transfer from sit to stand without the use of arms safely and independently from a variety of chairs/surfaces    Baseline patient has difficulty with sit to stand from low surfaces and tends to result in occasional  falls   Time 4   Period Weeks   Status New   OT LONG TERM GOAL #4   Title Patient will complete buttons on shirts and pants with modified independence   Baseline patient has difficulty with buttons and requires assistance at times   Time 4   Period Weeks   Status New   OT LONG TERM GOAL #5   Title Patient will increase Berg Balance score by > 4 points to demonstrate decreased fall risk during functional activities   Baseline scored 48/56 this date but  balance flucuates with day   Time 4   Period Weeks   Status New   Long Term Additional Goals   Additional Long Term Goals Yes   OT LONG TERM GOAL #6   Title Patient will demonstrate squatting to pick up item from floor safely and without loss of balance.   Baseline difficulty with consistent performance.   Time 4   Period Weeks   Status New   OT LONG TERM GOAL #7   Title Patient will demonstrate handwriting of name, address and phone number with 80% legibility.   Baseline micrographia and decreased legibility less than 50%   Time 4   Period Weeks   Status New               Plan - 10/17/14 0919    Clinical Impression Statement Patient requires cues for form and technique with maximal daily exercises and CGA to SBA for all exercises in standing.  Cues to not overstep forwards with return from stepping back, cues for coordination of bilateral arms with legs during rock and reach and cues for twisting and reaching with rock and reach sideways.  Was able to perform 2 longer distance functional mobility trials this date with one short rest break.   Pt will benefit from skilled therapeutic intervention in order to improve on the following deficits (Retired) Decreased strength;Decreased knowledge of use of DME;Difficulty walking;Decreased mobility;Decreased balance;Decreased safety awareness;Decreased coordination   Rehab Potential Good   OT Frequency 4x / week   OT Treatment/Interventions Self-care/ADL training;Gait Training;Stair  Training;Functional Furniture conservator/restorer;Therapeutic exercises;Manual Therapy;Neuromuscular education;Patient/family education;DME and/or AE instruction;Balance training   OT Home Exercise Plan Issued HEP with maximal daily exercises with written/pictorial    Consulted and Agree with Plan of Care Patient        Problem List Patient Active Problem List   Diagnosis Date Noted  . Skin lesion 10/02/2014  . Irritation of eyelid 08/20/2014  . Dupuytren's contracture 08/20/2014  . Cough 07/21/2014  . SOM (serous otitis media) 07/21/2014  . Lumbar stenosis with neurogenic claudication 06/13/2014  . Preop cardiovascular exam 05/28/2014  . S/P deep brain stimulator placement 05/08/2014  . PD (Parkinson's disease) 05/08/2014  . Joint pain 01/22/2014  . Medicare annual wellness visit, initial 01/19/2014  . Advance care planning 01/19/2014  . Parkinson's disease 12/13/2013  . PTSD (post-traumatic stress disorder) 06/13/2013  . Erectile dysfunction 06/13/2013  . HLD (hyperlipidemia) 06/13/2013  . Hip pain 06/10/2013  . Pain in joint, shoulder region 06/10/2013  . Right leg swelling 06/03/2013  . Essential hypertension 06/03/2013  . Bradycardia by electrocardiogram 06/03/2013  . Obstructive sleep apnea 03/12/2013  . Spinal stenosis, lumbar region, with neurogenic claudication 12/14/2012   Achilles Dunk, OTR/L, CLT Estanislao Harmon 10/17/2014, 10:13 AM  [place], [place], [address] Phone: [phone]   Fax:  [phone]

## 2014-10-20 ENCOUNTER — Observation Stay
Admission: EM | Admit: 2014-10-20 | Discharge: 2014-10-24 | Disposition: A | Discharge status: Home / Self Care | Payer: Medicare PPO | Attending: Surgery | Admitting: Surgery

## 2014-10-20 ENCOUNTER — Encounter: Payer: Self-pay | Admitting: *Deleted

## 2014-10-20 ENCOUNTER — Other Ambulatory Visit: Payer: Self-pay | Admitting: Cardiovascular Disease

## 2014-10-20 DIAGNOSIS — K8020 Calculus of gallbladder without cholecystitis without obstruction: Secondary | ICD-10-CM

## 2014-10-20 DIAGNOSIS — K828 Other specified diseases of gallbladder: Secondary | ICD-10-CM | POA: Diagnosis present

## 2014-10-20 DIAGNOSIS — G4733 Obstructive sleep apnea (adult) (pediatric): Secondary | ICD-10-CM | POA: Diagnosis not present

## 2014-10-20 DIAGNOSIS — N200 Calculus of kidney: Secondary | ICD-10-CM | POA: Insufficient documentation

## 2014-10-20 DIAGNOSIS — F4310 Post-traumatic stress disorder, unspecified: Secondary | ICD-10-CM | POA: Diagnosis not present

## 2014-10-20 DIAGNOSIS — G20 Parkinson's disease: Secondary | ICD-10-CM | POA: Diagnosis not present

## 2014-10-20 DIAGNOSIS — K219 Gastro-esophageal reflux disease without esophagitis: Secondary | ICD-10-CM | POA: Insufficient documentation

## 2014-10-20 DIAGNOSIS — K8000 Calculus of gallbladder with acute cholecystitis without obstruction: Secondary | ICD-10-CM | POA: Diagnosis not present

## 2014-10-20 DIAGNOSIS — F329 Major depressive disorder, single episode, unspecified: Secondary | ICD-10-CM | POA: Insufficient documentation

## 2014-10-20 DIAGNOSIS — K819 Cholecystitis, unspecified: Secondary | ICD-10-CM

## 2014-10-20 DIAGNOSIS — I10 Essential (primary) hypertension: Secondary | ICD-10-CM | POA: Insufficient documentation

## 2014-10-20 DIAGNOSIS — R001 Bradycardia, unspecified: Secondary | ICD-10-CM | POA: Diagnosis not present

## 2014-10-20 DIAGNOSIS — F419 Anxiety disorder, unspecified: Secondary | ICD-10-CM | POA: Diagnosis not present

## 2014-10-20 DIAGNOSIS — E785 Hyperlipidemia, unspecified: Secondary | ICD-10-CM | POA: Insufficient documentation

## 2014-10-20 DIAGNOSIS — Z791 Long term (current) use of non-steroidal anti-inflammatories (NSAID): Secondary | ICD-10-CM | POA: Insufficient documentation

## 2014-10-20 DIAGNOSIS — R1011 Right upper quadrant pain: Secondary | ICD-10-CM | POA: Diagnosis present

## 2014-10-20 LAB — CBC WITH DIFFERENTIAL/PLATELET
BASOS PCT: 0 %
Basophils Absolute: 0 10*3/uL (ref 0–0.1)
Eosinophils Absolute: 0.1 10*3/uL (ref 0–0.7)
Eosinophils Relative: 1 %
HCT: 43 % (ref 40.0–52.0)
Hemoglobin: 14.5 g/dL (ref 13.0–18.0)
Lymphocytes Relative: 17 %
Lymphs Abs: 2.2 10*3/uL (ref 1.0–3.6)
MCH: 29.8 pg (ref 26.0–34.0)
MCHC: 33.8 g/dL (ref 32.0–36.0)
MCV: 88.1 fL (ref 80.0–100.0)
MONO ABS: 0.9 10*3/uL (ref 0.2–1.0)
MONOS PCT: 7 %
NEUTROS PCT: 75 %
Neutro Abs: 9.9 10*3/uL — ABNORMAL HIGH (ref 1.4–6.5)
Platelets: 188 10*3/uL (ref 150–440)
RBC: 4.89 MIL/uL (ref 4.40–5.90)
RDW: 14.5 % (ref 11.5–14.5)
WBC: 13.2 10*3/uL — ABNORMAL HIGH (ref 3.8–10.6)

## 2014-10-20 LAB — COMPREHENSIVE METABOLIC PANEL
ALT: 61 U/L (ref 17–63)
ANION GAP: 11 (ref 5–15)
AST: 97 U/L — ABNORMAL HIGH (ref 15–41)
Albumin: 4.2 g/dL (ref 3.5–5.0)
Alkaline Phosphatase: 116 U/L (ref 38–126)
BUN: 16 mg/dL (ref 6–20)
CALCIUM: 9 mg/dL (ref 8.9–10.3)
CO2: 27 mmol/L (ref 22–32)
Chloride: 102 mmol/L (ref 101–111)
Creatinine, Ser: 0.86 mg/dL (ref 0.61–1.24)
GFR calc Af Amer: >60 mL/min (ref >60)
Glucose, Bld: 118 mg/dL — ABNORMAL HIGH (ref 65–99)
POTASSIUM: 3.6 mmol/L (ref 3.5–5.1)
SODIUM: 140 mmol/L (ref 135–145)
TOTAL PROTEIN: 7.1 g/dL (ref 6.5–8.1)
Total Bilirubin: 1 mg/dL (ref 0.3–1.2)

## 2014-10-20 LAB — LIPASE, BLOOD: LIPASE: 23 U/L (ref 22–51)

## 2014-10-20 MED ORDER — GI COCKTAIL ~~LOC~~
ORAL | Status: AC
  Administered 2014-10-20: 30 mL via ORAL
  Filled 2014-10-20: qty 30

## 2014-10-20 MED ORDER — GI COCKTAIL ~~LOC~~
30.0000 mL | Freq: Once | ORAL | Status: AC
  Administered 2014-10-20: 30 mL via ORAL

## 2014-10-20 NOTE — ED Provider Notes (Signed)
[facility] Emergency Department Provider Note  ____________________________________________  Time seen: 11:15 PM  I have reviewed the triage vital signs and the nursing notes.   HISTORY  Chief Complaint Abdominal Pain      HPI [name] is a 64 y.o. male resents with epigastric discomfort 1 day patient stated pain started abruptly at 4:00 AM today. Patient denies any nausea no vomiting or no diarrhea. Patient admits to aggravating factor being eating. Patient denies any alleviating factor current pain score is 4 out of 10 described as sharp.     Past Medical History  Diagnosis Date  . Parkinson's disease     dx'ed 15 years ago  . PTSD (post-traumatic stress disorder)   . Bradycardia   . Hypertension     treated with HCTZ  . Sleep apnea     doesn't use C-pap  . Varicose veins   . Cancer 2013    skin cancer  . Arthritis   . History of chicken pox   . GERD (gastroesophageal reflux disease)   . Dysrhythmia     chronic slow heart rate  . Depression     ptsd  . History of kidney stones   . Headache(784.0)     tension headaches non recent  . Shortness of breath dyspnea     Patient Active Problem List   Diagnosis Date Noted  . Porcelain gallbladder 10/21/2014  . Skin lesion 10/02/2014  . Irritation of eyelid 08/20/2014  . Dupuytren's contracture 08/20/2014  . Cough 07/21/2014  . SOM (serous otitis media) 07/21/2014  . Lumbar stenosis with neurogenic claudication 06/13/2014  . Preop cardiovascular exam 05/28/2014  . S/P deep brain stimulator placement 05/08/2014  . PD (Parkinson's disease) 05/08/2014  . Joint pain 01/22/2014  . Medicare annual wellness visit, initial 01/19/2014  . Advance care planning 01/19/2014  . Parkinson's disease 12/13/2013  . PTSD (post-traumatic stress disorder) 06/13/2013  . Erectile dysfunction 06/13/2013  . HLD (hyperlipidemia) 06/13/2013  . Hip pain 06/10/2013  . Pain in joint, shoulder region  06/10/2013  . Right leg swelling 06/03/2013  . Essential hypertension 06/03/2013  . Bradycardia by electrocardiogram 06/03/2013  . Obstructive sleep apnea 03/12/2013  . Spinal stenosis, lumbar region, with neurogenic claudication 12/14/2012    Past Surgical History  Procedure Laterality Date  . Cyst removed       from lip as a child  . Skin cancer removed      from ears,   12 lft arm  rt leg 15  . Lumbar laminectomy/decompression microdiscectomy Bilateral 12/14/2012    Procedure: Bilateral lumbar three-four, four-five decompressive laminotomy/foraminotomy;  Surgeon: Charlie Pitter, MD;  Location: [place] [facility];  Service: Neurosurgery;  Laterality: Bilateral;  . Subthalamic stimulator insertion Bilateral 12/06/2013    Procedure: SUBTHALAMIC STIMULATOR INSERTION;  Surgeon: Erline Levine, MD;  Location: [place] [facility];  Service: Neurosurgery;  Laterality: Bilateral;  Bilateral deep brain stimulator placement  . Pulse generator implant Bilateral 12/13/2013    Procedure: Bilateral implantable pulse generator placement;  Surgeon: Erline Levine, MD;  Location: [place] [facility];  Service: Neurosurgery;  Laterality: Bilateral;  Bilateral implantable pulse generator placement    Current Outpatient Rx  Name  Route  Sig  Dispense  Refill  . buPROPion (WELLBUTRIN XL) 300 MG 24 hr tablet   Oral   Take 1 tablet (300 mg total) by mouth daily.   90 tablet   2   . carbidopa-levodopa (SINEMET IR) 25-100 MG per tablet   Oral  Take 1-2 tablets by mouth 2 (two) times daily. Take 2 tablets by mouth in the morning, 1 tablets in the afternoon and take 1 tablet by mouth in the evening.         . cholecalciferol (VITAMIN D) 1000 UNITS tablet   Oral   Take 1,000 Units by mouth daily.         . hydrochlorothiazide (HYDRODIURIL) 25 MG tablet   Oral   Take 1 tablet (25 mg total) by mouth daily.   90 tablet   3   . pravastatin (PRAVACHOL) 20 MG tablet   Oral   Take 1 tablet (20 mg total) by mouth every  evening.   90 tablet   3   . fluticasone (FLONASE) 50 MCG/ACT nasal spray   Each Nare   Place 2 sprays into both nostrils daily as needed for allergies.   48 g   1   . ketotifen (ZADITOR) 0.025 % ophthalmic solution   Both Eyes   Place 1 drop into both eyes 2 (two) times daily. Patient not taking: Reported on 10/01/2014   5 mL   1     Allergies Review of patient's allergies indicates no known allergies.  Family History  Problem Relation Age of Onset  . Colon cancer Neg Hx   . Prostate cancer Neg Hx     Social History History  Substance Use Topics  . Smoking status: Never Smoker   . Smokeless tobacco: Former Systems developer  . Alcohol Use: 0.0 oz/week    0 Standard drinks or equivalent per week     Comment: occasional (twice per month)    Review of Systems  Constitutional: Negative for fever. Eyes: Negative for visual changes. ENT: Negative for sore throat. Cardiovascular: Negative for chest pain. Respiratory: Negative for shortness of breath. Gastrointestinal: Positive for abdominal pain, vomiting and diarrhea. Genitourinary: Negative for dysuria. Musculoskeletal: Negative for back pain. Skin: Negative for rash. Neurological: Negative for headaches, focal weakness or numbness.   10-point ROS otherwise negative.  ____________________________________________   PHYSICAL EXAM:  VITAL SIGNS: ED Triage Vitals  Enc Vitals Group     BP 10/20/14 2220 147/60 mmHg     Pulse Rate 10/20/14 2220 48     Resp 10/20/14 2220 20     Temp 10/20/14 2220 98.3 F (36.8 C)     Temp Source 10/20/14 2220 Oral     SpO2 10/20/14 2220 97 %     Weight 10/20/14 2220 175 lb (79.379 kg)     Height 10/20/14 2220 5\' 9"  (1.753 m)     Head Cir --      Peak Flow --      Pain Score 10/20/14 2222 5     Pain Loc --      Pain Edu? --      Excl. in [place]? --     Constitutional: Alert and oriented. Well appearing and in no distress. Eyes: Conjunctivae are normal. PERRL. Normal extraocular  movements. ENT   Head: Normocephalic and atraumatic.   Nose: No congestion/rhinnorhea.   Mouth/Throat: Mucous membranes are moist.   Neck: No stridor. Hematological/Lymphatic/Immunilogical: No cervical lymphadenopathy. Cardiovascular: Normal rate, regular rhythm. Normal and symmetric distal pulses are present in all extremities. No murmurs, rubs, or gallops. Respiratory: Normal respiratory effort without tachypnea nor retractions. Breath sounds are clear and equal bilaterally. No wheezes/rales/rhonchi. Gastrointestinal: Tender to palpation right upper quadrant epigastric and left upper quadrant. No distention. There is no CVA tenderness. Genitourinary: deferred Musculoskeletal: Nontender with normal  range of motion in all extremities. No joint effusions.  No lower extremity tenderness nor edema. Neurologic:  Normal speech and language. No gross focal neurologic deficits are appreciated. Speech is normal.  Skin:  Skin is warm, dry and intact. No rash noted. Psychiatric: Mood and affect are normal. Speech and behavior are normal. Patient exhibits appropriate insight and judgment.  ____________________________________________    LABS (pertinent positives/negatives)  Labs Reviewed  CBC WITH DIFFERENTIAL/PLATELET - Abnormal; Notable for the following:    WBC 13.2 (*)    Neutro Abs 9.9 (*)    All other components within normal limits  COMPREHENSIVE METABOLIC PANEL - Abnormal; Notable for the following:    Glucose, Bld 118 (*)    AST 97 (*)    All other components within normal limits  URINALYSIS COMPLETEWITH MICROSCOPIC ([facility] ONLY) - Abnormal; Notable for the following:    Color, Urine YELLOW (*)    APPearance CLEAR (*)    Squamous Epithelial / LPF 0-5 (*)    All other components within normal limits  LIPASE, BLOOD  CBC  CREATININE, SERUM       RADIOLOGY  CT scan of the abdomen and pelvis revealed: IMPRESSION: 1. Gallbladder calcification which favors porcelain  gallbladder over cholelithiasis. No superimposed acute inflammation to explain acute abdominal pain. Surgical referral is recommended. 2. Nonobstructive left nephrolithiasis.  ____________________________________________    INITIAL IMPRESSION / ASSESSMENT AND PLAN / ED COURSE  Pertinent labs & imaging results that were available during my care of the patient were reviewed by me and considered in my medical decision making (see chart for details).  History and physical concerning for cholelithiasis vs colon pathology as such CT abdomen performed revealing porcelain gallbladder. As such Dr Rexene Edison was notified  ____________________________________________   FINAL CLINICAL IMPRESSION(S) / ED DIAGNOSES  Final diagnoses:  Porcelain gallbladder      Gregor Hams, MD 10/21/14 [phone]

## 2014-10-20 NOTE — ED Notes (Signed)
Pt has abd pain. Sx began this morning.  Taking otc meds without relief.   No n/v/d.  last bm was today.   Denies urinary sx.

## 2014-10-20 NOTE — ED Notes (Signed)
MD at bedside. 

## 2014-10-21 ENCOUNTER — Ambulatory Visit: Payer: Medicare PPO | Admitting: Occupational Therapy

## 2014-10-21 ENCOUNTER — Encounter: Payer: Self-pay | Admitting: Radiology

## 2014-10-21 ENCOUNTER — Emergency Department: Payer: Medicare PPO

## 2014-10-21 ENCOUNTER — Observation Stay: Payer: Medicare PPO

## 2014-10-21 DIAGNOSIS — K8000 Calculus of gallbladder with acute cholecystitis without obstruction: Secondary | ICD-10-CM | POA: Diagnosis not present

## 2014-10-21 DIAGNOSIS — R1013 Epigastric pain: Secondary | ICD-10-CM

## 2014-10-21 DIAGNOSIS — R110 Nausea: Secondary | ICD-10-CM

## 2014-10-21 DIAGNOSIS — K828 Other specified diseases of gallbladder: Secondary | ICD-10-CM | POA: Diagnosis present

## 2014-10-21 LAB — URINALYSIS COMPLETE WITH MICROSCOPIC (ARMC ONLY)
Bacteria, UA: NONE SEEN
Bilirubin Urine: NEGATIVE
Glucose, UA: NEGATIVE mg/dL
HGB URINE DIPSTICK: NEGATIVE
Ketones, ur: NEGATIVE mg/dL
Leukocytes, UA: NEGATIVE
Nitrite: NEGATIVE
PROTEIN: NEGATIVE mg/dL
Specific Gravity, Urine: 1.009 (ref 1.005–1.030)
pH: 6 (ref 5.0–8.0)

## 2014-10-21 LAB — CBC
HEMATOCRIT: 40.1 % (ref 40.0–52.0)
Hemoglobin: 14 g/dL (ref 13.0–18.0)
MCH: 30.6 pg (ref 26.0–34.0)
MCHC: 34.9 g/dL (ref 32.0–36.0)
MCV: 87.6 fL (ref 80.0–100.0)
Platelets: 163 10*3/uL (ref 150–440)
RBC: 4.58 MIL/uL (ref 4.40–5.90)
RDW: 14.7 % — AB (ref 11.5–14.5)
WBC: 7.2 10*3/uL (ref 3.8–10.6)

## 2014-10-21 LAB — SURGICAL PCR SCREEN
MRSA, PCR: NEGATIVE
Staphylococcus aureus: NEGATIVE

## 2014-10-21 LAB — CREATININE, SERUM
Creatinine, Ser: 0.89 mg/dL (ref 0.61–1.24)
GFR calc Af Amer: >60 mL/min (ref >60)

## 2014-10-21 MED ORDER — CEFAZOLIN SODIUM-DEXTROSE 2-3 GM-% IV SOLR
2.0000 g | Freq: Once | INTRAVENOUS | Status: AC
  Administered 2014-10-21: 2 g via INTRAVENOUS
  Filled 2014-10-21: qty 50

## 2014-10-21 MED ORDER — CEFAZOLIN SODIUM-DEXTROSE 2-3 GM-% IV SOLR
2.0000 g | Freq: Once | INTRAVENOUS | Status: AC
  Administered 2014-10-22: 2 g via INTRAVENOUS
  Filled 2014-10-21: qty 50

## 2014-10-21 MED ORDER — MORPHINE SULFATE 2 MG/ML IJ SOLN
2.0000 mg | Freq: Once | INTRAMUSCULAR | Status: AC
  Administered 2014-10-21: 2 mg via INTRAVENOUS

## 2014-10-21 MED ORDER — CARBIDOPA-LEVODOPA 25-100 MG PO TABS
2.0000 | ORAL_TABLET | Freq: Every day | ORAL | Status: DC
  Administered 2014-10-21 – 2014-10-24 (×3): 2 via ORAL
  Filled 2014-10-21 (×3): qty 2

## 2014-10-21 MED ORDER — IOHEXOL 300 MG/ML  SOLN
100.0000 mL | Freq: Once | INTRAMUSCULAR | Status: AC | PRN
  Administered 2014-10-21: 100 mL via INTRAVENOUS

## 2014-10-21 MED ORDER — IOHEXOL 240 MG/ML SOLN
25.0000 mL | Freq: Once | INTRAMUSCULAR | Status: AC | PRN
  Administered 2014-10-20 – 2014-10-21 (×2): 25 mL via ORAL

## 2014-10-21 MED ORDER — ONDANSETRON HCL 4 MG/2ML IJ SOLN
4.0000 mg | Freq: Once | INTRAMUSCULAR | Status: AC
  Administered 2014-10-21: 4 mg via INTRAVENOUS

## 2014-10-21 MED ORDER — ACETAMINOPHEN 325 MG PO TABS
650.0000 mg | ORAL_TABLET | Freq: Four times a day (QID) | ORAL | Status: DC | PRN
  Administered 2014-10-21 – 2014-10-24 (×2): 650 mg via ORAL
  Filled 2014-10-21 (×2): qty 2

## 2014-10-21 MED ORDER — CEFAZOLIN SODIUM 1-5 GM-% IV SOLN
INTRAVENOUS | Status: AC
  Filled 2014-10-21: qty 50

## 2014-10-21 MED ORDER — ACETAMINOPHEN 650 MG RE SUPP: 650.0000 mg | Freq: Four times a day (QID) | RECTAL | Status: DC | PRN

## 2014-10-21 MED ORDER — MORPHINE SULFATE 2 MG/ML IJ SOLN
2.0000 mg | INTRAMUSCULAR | Status: DC | PRN
  Administered 2014-10-21 (×2): 2 mg via INTRAVENOUS
  Administered 2014-10-21: 4 mg via INTRAVENOUS
  Administered 2014-10-22 (×4): 2 mg via INTRAVENOUS
  Administered 2014-10-22: 4 mg via INTRAVENOUS
  Administered 2014-10-22 – 2014-10-23 (×5): 2 mg via INTRAVENOUS
  Filled 2014-10-21: qty 2
  Filled 2014-10-21 (×6): qty 1
  Filled 2014-10-21 (×2): qty 2
  Filled 2014-10-21 (×4): qty 1

## 2014-10-21 MED ORDER — ONDANSETRON HCL 4 MG/2ML IJ SOLN
4.0000 mg | Freq: Four times a day (QID) | INTRAMUSCULAR | Status: DC | PRN
  Administered 2014-10-22: 4 mg via INTRAVENOUS

## 2014-10-21 MED ORDER — HYDROCHLOROTHIAZIDE 25 MG PO TABS
25.0000 mg | ORAL_TABLET | Freq: Every day | ORAL | Status: DC
  Administered 2014-10-21 – 2014-10-24 (×3): 25 mg via ORAL
  Filled 2014-10-21 (×3): qty 1

## 2014-10-21 MED ORDER — ONDANSETRON HCL 4 MG/2ML IJ SOLN
INTRAMUSCULAR | Status: AC
  Administered 2014-10-21: 4 mg via INTRAVENOUS
  Filled 2014-10-21: qty 2

## 2014-10-21 MED ORDER — CARBIDOPA-LEVODOPA 25-100 MG PO TABS
1.0000 | ORAL_TABLET | Freq: Every day | ORAL | Status: DC
  Administered 2014-10-21 – 2014-10-24 (×3): 1 via ORAL
  Filled 2014-10-21 (×3): qty 1

## 2014-10-21 MED ORDER — HEPARIN SODIUM (PORCINE) 5000 UNIT/ML IJ SOLN
5000.0000 [IU] | Freq: Three times a day (TID) | INTRAMUSCULAR | Status: DC
  Administered 2014-10-21 (×3): 5000 [IU] via SUBCUTANEOUS
  Filled 2014-10-21 (×3): qty 1

## 2014-10-21 MED ORDER — CARBIDOPA-LEVODOPA 25-100 MG PO TABS
1.0000 | ORAL_TABLET | Freq: Every evening | ORAL | Status: DC
  Administered 2014-10-21 – 2014-10-23 (×3): 1 via ORAL
  Filled 2014-10-21 (×2): qty 1

## 2014-10-21 MED ORDER — SODIUM CHLORIDE 0.9 % IV SOLN
INTRAVENOUS | Status: DC
  Administered 2014-10-21 (×2): via INTRAVENOUS
  Administered 2014-10-22: 100 mL/h via INTRAVENOUS
  Administered 2014-10-22 – 2014-10-24 (×4): via INTRAVENOUS

## 2014-10-21 MED ORDER — CARBIDOPA-LEVODOPA 25-100 MG PO TABS
1.0000 | ORAL_TABLET | Freq: Two times a day (BID) | ORAL | Status: DC
Start: 2014-10-21 — End: 2014-10-21

## 2014-10-21 MED ORDER — BUPROPION HCL ER (XL) 300 MG PO TB24
300.0000 mg | ORAL_TABLET | Freq: Every day | ORAL | Status: DC
  Administered 2014-10-21 – 2014-10-24 (×3): 300 mg via ORAL
  Filled 2014-10-21 (×3): qty 1

## 2014-10-21 MED ORDER — PANTOPRAZOLE SODIUM 40 MG IV SOLR
INTRAVENOUS | Status: AC
  Administered 2014-10-21: 40 mg via INTRAVENOUS
  Filled 2014-10-21: qty 40

## 2014-10-21 MED ORDER — MORPHINE SULFATE 2 MG/ML IJ SOLN
INTRAMUSCULAR | Status: AC
  Administered 2014-10-21: 2 mg via INTRAVENOUS
  Filled 2014-10-21: qty 1

## 2014-10-21 MED ORDER — PANTOPRAZOLE SODIUM 40 MG IV SOLR
40.0000 mg | Freq: Every day | INTRAVENOUS | Status: DC
  Administered 2014-10-21 (×2): 40 mg via INTRAVENOUS
  Filled 2014-10-21: qty 40

## 2014-10-21 NOTE — Consult Note (Signed)
[facility] NAME: [name]    MR#:  [id number]  DATE OF BIRTH:  [birth year]  DATE OF ADMISSION:  10/20/2014  PRIMARY CARE PHYSICIAN: Elsie Stain, MD   REQUESTING/REFERRING PHYSICIAN: Bronson Ing, M.D.  CHIEF COMPLAINT:   Chief Complaint  Patient presents with  . Abdominal Pain    HISTORY OF PRESENT ILLNESS:  [name]  is a 64 y.o. male with a known history of bradycardia, Parkinson's disease, hypertension, gastroesophageal reflux disease. He presents to the ER and was admitted by the surgical team for pain in his abdomen in the epigastric, right upper quadrant area that woke him up yesterday morning. It's been constant 4-5 out of 10 in intensity, the pain medications help a little bit. Nothing has made it better yet. Associated with nausea but no vomiting.  Hospitalist services were consultation for bradycardia and possible preop management. Of note the patient does have a history of bradycardia and did see Dr. Rockey Situ cardiology prior to his back surgery in February 2016. The patient has no complaints of chest pain. Some shortness of breath with walking.  PAST MEDICAL HISTORY:   Past Medical History  Diagnosis Date  . Parkinson's disease     dx'ed 15 years ago  . PTSD (post-traumatic stress disorder)   . Bradycardia   . Hypertension     treated with HCTZ  . Sleep apnea     doesn't use C-pap  . Varicose veins   . Cancer 2013    skin cancer  . Arthritis   . History of chicken pox   . GERD (gastroesophageal reflux disease)   . Dysrhythmia     chronic slow Mcgee rate  . Depression     ptsd  . History of kidney stones   . Headache(784.0)     tension headaches non recent  . Shortness of breath dyspnea     PAST SURGICAL HISTOIRY:   Past Surgical History  Procedure Laterality Date  . Cyst removed       from lip as a child  . Skin cancer removed      from ears,   11 lft arm  rt leg 14  . Lumbar  laminectomy/decompression microdiscectomy Bilateral 12/14/2012    Procedure: Bilateral lumbar three-four, four-five decompressive laminotomy/foraminotomy;  Surgeon: Charlie Pitter, MD;  Location: [facility];  Service: Neurosurgery;  Laterality: Bilateral;  . Subthalamic stimulator insertion Bilateral 12/06/2013    Procedure: SUBTHALAMIC STIMULATOR INSERTION;  Surgeon: Erline Levine, MD;  Location: [facility];  Service: Neurosurgery;  Laterality: Bilateral;  Bilateral deep brain stimulator placement  . Pulse generator implant Bilateral 12/13/2013    Procedure: Bilateral implantable pulse generator placement;  Surgeon: Erline Levine, MD;  Location: [facility];  Service: Neurosurgery;  Laterality: Bilateral;  Bilateral implantable pulse generator placement    SOCIAL HISTORY:   History  Substance Use Topics  . Smoking status: Never Smoker   . Smokeless tobacco: Former Systems developer  . Alcohol Use: 0.0 oz/week    0 Standard drinks or equivalent per week     Comment: occasional (twice per month)    FAMILY HISTORY:   Family History  Problem Relation Age of Onset  . Colon cancer Neg Hx   . Prostate cancer Neg Hx     DRUG ALLERGIES:  No Known Allergies  REVIEW OF SYSTEMS:  CONSTITUTIONAL: No fever, positive for fatigue. No weight loss or weight gain. EYES: No blurred or double vision.  Wears glasses. EARS, NOSE, AND THROAT: No tinnitus or ear pain. Positive for runny nose and postnasal drip. Positive for dysphagia with liquids secondary to Parkinson's disease. RESPIRATORY: No cough, shortness of breath, wheezing or hemoptysis. Admits to shortness of breath with walking occasionally. CARDIOVASCULAR: No chest pain, orthopnea, edema.  GASTROINTESTINAL: Positive for nausea and epigastric right upper quadrant abdominal pain. No vomiting or diarrhea. No blood in the bowel movements. GENITOURINARY: No dysuria, hematuria.  ENDOCRINE: No polyuria, nocturia,  HEMATOLOGY: No anemia, easy bruising or  bleeding SKIN: No rash or lesion. MUSCULOSKELETAL: Positive for low back pain and finger pain NEUROLOGIC: No tingling, numbness, weakness.  PSYCHIATRY: Positive for anxiety and depression.   MEDICATIONS AT HOME:   Prior to Admission medications   Medication Sig Start Date End Date Taking? Authorizing Provider  buPROPion (WELLBUTRIN XL) 300 MG 24 hr tablet Take 1 tablet (300 mg total) by mouth daily. 03/11/14  Yes Rebecca S Tat, DO  carbidopa-levodopa (SINEMET IR) 25-100 MG per tablet Take 1-2 tablets by mouth 2 (two) times daily. Take 2 tablets by mouth in the morning, 1 tablets in the afternoon and take 1 tablet by mouth in the evening.   Yes Historical Provider, MD  cholecalciferol (VITAMIN D) 1000 UNITS tablet Take 1,000 Units by mouth daily.   Yes Historical Provider, MD  hydrochlorothiazide (HYDRODIURIL) 25 MG tablet Take 1 tablet (25 mg total) by mouth daily. 11/29/13  Yes Minna Merritts, MD  fluticasone (FLONASE) 50 MCG/ACT nasal spray Place 2 sprays into both nostrils daily as needed for allergies. 07/21/14   Tonia Ghent, MD  ketotifen (ZADITOR) 0.025 % ophthalmic solution Place 1 drop into both eyes 2 (two) times daily. Patient not taking: Reported on 10/01/2014 08/20/14   Tonia Ghent, MD  pravastatin (PRAVACHOL) 20 MG tablet TAKE 1 TABLET EVERY EVENING 10/21/14   Minna Merritts, MD      VITAL SIGNS:  Blood pressure 144/66, pulse 39, temperature 98.4 F (36.9 C), temperature source Oral, resp. rate 18, height 5\' 10"  (1.778 m), weight 82.01 kg (180 lb 12.8 oz), SpO2 98 %.  PHYSICAL EXAMINATION:  GENERAL:  64 y.o.-year-old patient lying in the bed with no acute distress.  EYES: Pupils equal, round, reactive to light and accommodation. No scleral icterus. Extraocular muscles intact.  HEENT: Head atraumatic, normocephalic. Oropharynx and nasopharynx clear.  NECK:  Supple, no jugular venous distention. No thyroid enlargement, no tenderness.  LUNGS: Normal breath sounds  bilaterally, no wheezing, rales,rhonchi or crepitation. No use of accessory muscles of respiration.  CARDIOVASCULAR: S1, S2 normal. 2 and a 6 systolic ejection murmur, no rubs, or gallops.  ABDOMEN: Soft, tenderness right upper quadrant and epigastric area, nondistended. Bowel sounds present. No organomegaly or mass.  EXTREMITIES: No pedal edema, cyanosis, or clubbing.  NEUROLOGIC: Cranial nerves II through XII are intact. Muscle strength 5/5 in all extremities. Sensation intact. Gait not checked.  PSYCHIATRIC: The patient is alert and oriented x 3.  SKIN: No obvious rash, lesion, or ulcer.   LABORATORY PANEL:   CBC  Recent Labs Lab 10/21/14 0656  WBC 7.2  HGB 14.0  HCT 40.1  PLT 163   Chemistries   Recent Labs Lab 10/20/14 2232 10/21/14 0656  NA 140  --   K 3.6  --   CL 102  --   CO2 27  --   GLUCOSE 118*  --   BUN 16  --   CREATININE 0.86 0.89  CALCIUM 9.0  --   AST  97*  --   ALT 61  --   ALKPHOS 116  --   BILITOT 1.0  --     RADIOLOGY:  Ct Abdomen Pelvis W Contrast  10/21/2014   CLINICAL DATA:  Right upper quadrant and epigastric pain.  EXAM: CT ABDOMEN AND PELVIS WITH CONTRAST  TECHNIQUE: Multidetector CT imaging of the abdomen and pelvis was performed using the standard protocol following bolus administration of intravenous contrast.  CONTRAST:  15mL OMNIPAQUE IOHEXOL 300 MG/ML  SOLN  COMPARISON:  None.  FINDINGS: BODY WALL: No contributory findings.  LOWER CHEST: No acute findings.  Multi focal coronary atherosclerosis.  ABDOMEN/PELVIS:  Liver: No focal abnormality.  Biliary: There is calcification peripherally about the fundus and body of the gallbladder which appears mural. There is multi focal wasting of the gallbladder which suggests adenomyomatosis.  Pancreas: Unremarkable.  Spleen: Chronic granulomatous changes.  Adrenals: Unremarkable.  Kidneys and ureters: Nonobstructive left nephrolithiasis, with the largest stone in the lower pole left kidney measuring 12 x 6  mm. No hydronephrosis or ureteral calculus  Bladder: Unremarkable.  Reproductive: No pathologic findings.  Bowel: No obstruction. No appendicitis.  Retroperitoneum: No mass or adenopathy.  Peritoneum: No ascites or pneumoperitoneum.  Vascular: No acute abnormality.  OSSEOUS: L4-5 discectomy with posterior fixation. Bony fusion appears complete. There has also been a L1 decompressive laminectomy. No acute osseous findings.  IMPRESSION: 1. Gallbladder calcification which favors porcelain gallbladder over cholelithiasis. No superimposed acute inflammation to explain acute abdominal pain. Surgical referral is recommended. 2. Nonobstructive left nephrolithiasis.   Electronically Signed   By: Monte Fantasia M.D.   On: 10/21/2014 00:41   US Abdomen Limited Ruq  10/21/2014   CLINICAL DATA:  Suspected cholecystitis, abdominal pain and nausea without vomiting for the past day  EXAM: US ABDOMEN LIMITED - RIGHT UPPER QUADRANT  COMPARISON:  CT scan of the abdomen and pelvis of today's date  FINDINGS: Gallbladder:  The gallbladder is poorly evaluated using the ultrasound technique due to mural calcification or contraction and multiple stones. There may be a stone in the gallbladder neck but again visualization is limited. No pericholecystic a fluid is observed.  Common bile duct:  Diameter: 2.6 mm  Liver:  The liver exhibits normal echotexture with no focal mass or ductal dilation.  IMPRESSION: There is an abnormal appearance of the gallbladder which suggests a wall echo shadow sign seen with a contracted gallbladder containing multiple stones. However, given the appearance appearance on the earlier CT today a "porcelain" gallbladder due to mural CT with or without stones is felt more likely.   Electronically Signed   By: David  Martinique M.D.   On: 10/21/2014 09:25    EKG:   Sinus bradycardia 49 bpm, poor R-wave progression. Biphasic T-wave in V1.  IMPRESSION AND PLAN:   1. Preoperative consultation for bradycardia.  Looking through the records patient was seen in the past by Dr. Rockey Situ prior to a back surgery in February 2016 for bradycardia. The patient's bradycardia has been going on for a long time and he is asymptomatic. Patient is not on any cardiac medications that would cause bradycardia. I will not change any of the patient's Parkinson's medications. No further cardiac testing is needed prior to surgery. Patient is a moderate risk for a moderate risk surgery. No contraindications to surgery at this time. 2. 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 Surgery evaluating for possible gallbladder removal. Continue IV PPI just  in case ulcer disease. Guaiac stools. 3. Parkinson's disease- I will continue his medications as he usually takes them. 4. Hyperlipidemia unspecified continue pravastatin. 5. Essential hypertension- continue on hydrochlorothiazide. 6. Anxiety and depression- continue Wellbutrin.  All the records are reviewed and case discussed with Consulting provider. Management plans discussed with the patient, family and he is in agreement.  CODE STATUS: DO NOT RESUSCITATE.  TOTAL TIME TAKING CARE OF THIS PATIENT: 50 minutes.    Loletha Grayer M.D on 10/21/2014 at 10:50 AM.  Between 7am to 6pm - Pager - [phone]  After 6pm go to [email or web address] - password [email or web address] [facility]  [facility]  Office  [phone]  CC: Primary care Physician: Elsie Stain, MD

## 2014-10-21 NOTE — Anesthesia Preprocedure Evaluation (Deleted)
Anesthesia Evaluation    Airway        Dental   Pulmonary shortness of breath, sleep apnea ,          Cardiovascular hypertension, + dysrhythmias     Neuro/Psych  Headaches, PSYCHIATRIC DISORDERS Depression Parkinson's PTSD    GI/Hepatic GERD-  Medicated,  Endo/Other    Renal/GU      Musculoskeletal  (+) Arthritis -,   Abdominal   Peds  Hematology   Anesthesia Other Findings   Reproductive/Obstetrics                            Anesthesia Physical Anesthesia Plan Anesthesia Quick Evaluation

## 2014-10-21 NOTE — Progress Notes (Signed)
MD notified of pt hr and pain level. MD is okay with pt getting iv morphine. HR 39-44. Pain level 5/10 abdomen and back. Verified layman's terms for consent. Will continue to monitor.

## 2014-10-21 NOTE — Anesthesia Preprocedure Evaluation (Addendum)
Anesthesia Evaluation  Patient identified by MRN, date of birth, ID band Patient awake    Reviewed: Allergy & Precautions, NPO status , Patient's Chart, lab work & pertinent test results  Airway Mallampati: II  TM Distance: >3 FB Neck ROM: Limited    Dental  (+) Teeth Intact   Pulmonary shortness of breath, sleep apnea ,  OSA--confirmed by sleep study, score is 16 and unable to tolerate any of the CPAP masks. breath sounds clear to auscultation        Cardiovascular Exercise Tolerance: Poor hypertension, Pt. on medications - dysrhythmias Rhythm:Regular Rate:Bradycardia  Bradycardic. Balance and gait problems d/t Parkinson's. Does not do stairs. ECG suggests old inferior MI. No hx of this from Mr. Langille.   Neuro/Psych  Headaches, PSYCHIATRIC DISORDERS Depression Advanced Parkinson's with deep brain stimulator and the usual medications.  Neuromuscular disease    GI/Hepatic GERD-  ,Acute cholecystitis. Abnormal swallowing study in the past. Reflux--not well controlled. Nausea earlier in this admission, not today.   Endo/Other    Renal/GU      Musculoskeletal  (+) Arthritis -,   Abdominal (+) + obese,  Abdomen: soft.    Peds  Hematology   Anesthesia Other Findings Parkinson's , PTSD, skin Cancer  Reproductive/Obstetrics                        Anesthesia Physical Anesthesia Plan  ASA: III and emergent  Anesthesia Plan: General   Post-op Pain Management:    Induction: Intravenous and Rapid sequence  Airway Management Planned: Oral ETT  Additional Equipment:   Intra-op Plan:   Post-operative Plan: Extubation in OR  Informed Consent: I have reviewed the patients History and Physical, chart, labs and discussed the procedure including the risks, benefits and alternatives for the proposed anesthesia with the patient or authorized representative who has indicated his/her understanding and  acceptance.     Plan Discussed with: CRNA  Anesthesia Plan Comments:         Anesthesia Quick Evaluation

## 2014-10-21 NOTE — Progress Notes (Signed)
Ultrasound demonstrated probable gallstones. I reviewed his history and discussed the case with the internal medicine physicians. In this situation with his Parkinson's disease and the likelihood of significant biliary tract symptoms we will proceed with surgical intervention. I have outlined the course with the patient and his wife. They are in agreement. We plan for surgery tomorrow in the morning. Risks benefits and options been outlined and they are in agreement.

## 2014-10-21 NOTE — H&P (Signed)
CC: Epigastric pain, nausea x 1 day  HPI: [name] is a pleasant 64 yo M with a history of PD and multiple orthopedic surgeries who presents with 1 day of acute onset epigastric pain and nausea.  Began acutely at 4:30 am 1 day ago.  Has not improved since them which drove him to the ER.  Some improvement with GI cocktail but still present.  + nausea.  Has had this pain twice before.  No unusual ingestions, no sick contacts.  No fevers/chills, night sweats, shortness of breath, cough, chest pain, diarrhea/constipation, dysuria/hematuria.  Active Ambulatory Problems    Diagnosis Date Noted  . Spinal stenosis, lumbar region, with neurogenic claudication 12/14/2012  . Obstructive sleep apnea 03/12/2013  . Right leg swelling 06/03/2013  . Essential hypertension 06/03/2013  . Bradycardia by electrocardiogram 06/03/2013  . Hip pain 06/10/2013  . Pain in joint, shoulder region 06/10/2013  . PTSD (post-traumatic stress disorder) 06/13/2013  . Erectile dysfunction 06/13/2013  . HLD (hyperlipidemia) 06/13/2013  . Parkinson's disease 12/13/2013  . Medicare annual wellness visit, initial 01/19/2014  . Advance care planning 01/19/2014  . Joint pain 01/22/2014  . S/P deep brain stimulator placement 05/08/2014  . PD (Parkinson's disease) 05/08/2014  . Preop cardiovascular exam 05/28/2014  . Lumbar stenosis with neurogenic claudication 06/13/2014  . Cough 07/21/2014  . SOM (serous otitis media) 07/21/2014  . Irritation of eyelid 08/20/2014  . Dupuytren's contracture 08/20/2014  . Skin lesion 10/02/2014   Resolved Ambulatory Problems    Diagnosis Date Noted  . Parkinson disease 12/14/2012   Past Medical History  Diagnosis Date  . Bradycardia   . Hypertension   . Sleep apnea   . Varicose veins   . Cancer 2013  . Arthritis   . History of chicken pox   . GERD (gastroesophageal reflux disease)   . Dysrhythmia   . Depression   . History of kidney stones   . Headache(784.0)   . Shortness  of breath dyspnea    History   Social History  . Marital Status: Married    Spouse Name: N/A  . Number of Children: N/A  . Years of Education: N/A   Occupational History  . disabled     2001, [employer]  .      search and rescue helicopter   Social History Main Topics  . Smoking status: Never Smoker   . Smokeless tobacco: Former Systems developer  . Alcohol Use: 0.0 oz/week    0 Standard drinks or equivalent per week     Comment: occasional (twice per month)  . Drug Use: No  . Sexual Activity: Not on file   Other Topics Concern  . 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    Married 2014   Family History  Problem Relation Age of Onset  . Colon cancer Neg Hx   . Prostate cancer Neg Hx      Medication List    ASK your doctor about these medications        buPROPion 300 MG 24 hr tablet  Commonly known as:  WELLBUTRIN XL  Take 1 tablet (300 mg total) by mouth daily.     carbidopa-levodopa 25-100 MG per tablet  Commonly known as:  SINEMET IR  Take 1-2 tablets by mouth 2 (two) times daily. Take 2 tablets by mouth in the morning, 1 tablets in the  afternoon and take 1 tablet by mouth in the evening.     cholecalciferol 1000 UNITS tablet  Commonly known as:  VITAMIN D  Take 1,000 Units by mouth daily.     fluticasone 50 MCG/ACT nasal spray  Commonly known as:  FLONASE  Place 2 sprays into both nostrils daily as needed for allergies.     hydrochlorothiazide 25 MG tablet  Commonly known as:  HYDRODIURIL  Take 1 tablet (25 mg total) by mouth daily.     ibuprofen 200 MG tablet  Commonly known as:  ADVIL,MOTRIN  Take 200 mg by mouth every 6 (six) hours as needed.     ketotifen 0.025 % ophthalmic solution  Commonly known as:  ZADITOR  Place 1 drop into both eyes 2 (two) times daily.     pravastatin 20 MG tablet  Commonly known as:  PRAVACHOL  Take 1 tablet (20 mg total) by mouth every  evening.       ROS: Full ROS reviewed, pertinent positives and negatives as above.    Blood pressure 154/81, pulse 48, temperature 98.3 F (36.8 C), temperature source Oral, resp. rate 17, height 5\' 9"  (1.753 m), weight 79.379 kg (175 lb), SpO2 99 %. GEN: NAD/A&Ox3 FACE: no obvious facial trauma, normal external nose, normal external ears EYES: no scleral icterus, no conjunctivitis HEAD: normocephalic atraumatic CV: RRR, no MRG RESP: moving air well, lungs clear ABD: soft, mild tender epigastric pain, nondistended EXT: moving all ext well, strength 5/5 NEURO: cnII-XII grossly intact, sensation intact all 4 ext  Labs: personally reviewed, Significant positives as above: WBC 13.2 Neutrophils 75  Imaging: reviewed Calcification of gallbladder  A/P  64 yo with acute onset epigastric pain, calcified gallbladder.  Although no obvious stones seen on CT, likely calcification due to chronic inflammation.  Have reviewed literature and still think that there is a mild increase in cancer risk in calcified gallbaldder.  I have just offered cholecystectomy.  I have explained the benefits and risk of cholecystectomy including reported 1:200 risk of gallbladder neoplasm.  He would like to proceed.  Will signout to daytime surgeon Dr. Pat Patrick who will evaluate patient himself in am.

## 2014-10-22 ENCOUNTER — Ambulatory Visit: Payer: Medicare PPO | Admitting: Occupational Therapy

## 2014-10-22 ENCOUNTER — Encounter
Admission: EM | Disposition: A | Discharge status: Home / Self Care | Payer: Self-pay | Source: Home / Self Care | Attending: Emergency Medicine

## 2014-10-22 ENCOUNTER — Observation Stay: Payer: Medicare PPO | Admitting: Anesthesiology

## 2014-10-22 ENCOUNTER — Observation Stay: Payer: Medicare PPO

## 2014-10-22 ENCOUNTER — Encounter: Payer: Self-pay | Admitting: Surgery

## 2014-10-22 DIAGNOSIS — K8000 Calculus of gallbladder with acute cholecystitis without obstruction: Secondary | ICD-10-CM | POA: Diagnosis not present

## 2014-10-22 DIAGNOSIS — K828 Other specified diseases of gallbladder: Secondary | ICD-10-CM

## 2014-10-22 HISTORY — PX: CHOLECYSTECTOMY: SHX55

## 2014-10-22 LAB — HEPATIC FUNCTION PANEL
ALBUMIN: 3.8 g/dL (ref 3.5–5.0)
ALT: 61 U/L (ref 17–63)
AST: 73 U/L — ABNORMAL HIGH (ref 15–41)
Alkaline Phosphatase: 139 U/L — ABNORMAL HIGH (ref 38–126)
BILIRUBIN DIRECT: 0.1 mg/dL (ref 0.1–0.5)
BILIRUBIN TOTAL: 0.9 mg/dL (ref 0.3–1.2)
Indirect Bilirubin: 0.8 mg/dL (ref 0.3–0.9)
TOTAL PROTEIN: 6.6 g/dL (ref 6.5–8.1)

## 2014-10-22 LAB — CBC
HCT: 40.6 % (ref 40.0–52.0)
Hemoglobin: 13.7 g/dL (ref 13.0–18.0)
MCH: 30 pg (ref 26.0–34.0)
MCHC: 33.6 g/dL (ref 32.0–36.0)
MCV: 89.3 fL (ref 80.0–100.0)
PLATELETS: 157 10*3/uL (ref 150–440)
RBC: 4.55 MIL/uL (ref 4.40–5.90)
RDW: 15 % — AB (ref 11.5–14.5)
WBC: 7.5 10*3/uL (ref 3.8–10.6)

## 2014-10-22 SURGERY — LAPAROSCOPIC CHOLECYSTECTOMY WITH INTRAOPERATIVE CHOLANGIOGRAM
Anesthesia: General

## 2014-10-22 MED ORDER — PRAVASTATIN SODIUM 20 MG PO TABS
20.0000 mg | ORAL_TABLET | Freq: Every evening | ORAL | Status: DC
  Administered 2014-10-22 – 2014-10-23 (×2): 20 mg via ORAL
  Filled 2014-10-22 (×2): qty 1

## 2014-10-22 MED ORDER — PROPOFOL 10 MG/ML IV BOLUS
INTRAVENOUS | Status: DC | PRN
  Administered 2014-10-22: 150 mg via INTRAVENOUS

## 2014-10-22 MED ORDER — SODIUM CHLORIDE 0.9 % IR SOLN
Status: DC | PRN
  Administered 2014-10-22: 1000 mL

## 2014-10-22 MED ORDER — ROCURONIUM BROMIDE 100 MG/10ML IV SOLN
INTRAVENOUS | Status: DC | PRN
  Administered 2014-10-22: 40 mg via INTRAVENOUS
  Administered 2014-10-22: 10 mg via INTRAVENOUS

## 2014-10-22 MED ORDER — MIDAZOLAM HCL 5 MG/5ML IJ SOLN
INTRAMUSCULAR | Status: DC | PRN
  Administered 2014-10-22: 2 mg via INTRAVENOUS

## 2014-10-22 MED ORDER — HEPARIN SODIUM (PORCINE) 1000 UNIT/ML IJ SOLN
INTRAMUSCULAR | Status: DC | PRN
  Administered 2014-10-22: 1 mL

## 2014-10-22 MED ORDER — HYDROMORPHONE HCL 1 MG/ML IJ SOLN
0.2500 mg | INTRAMUSCULAR | Status: DC | PRN
  Administered 2014-10-22: 0.5 mg via INTRAVENOUS

## 2014-10-22 MED ORDER — ACETAMINOPHEN 10 MG/ML IV SOLN
INTRAVENOUS | Status: DC | PRN
  Administered 2014-10-22: 1000 mg via INTRAVENOUS

## 2014-10-22 MED ORDER — SUGAMMADEX SODIUM 200 MG/2ML IV SOLN
INTRAVENOUS | Status: DC | PRN
  Administered 2014-10-22: 200 mg via INTRAVENOUS

## 2014-10-22 MED ORDER — FAMOTIDINE 20 MG PO TABS
20.0000 mg | ORAL_TABLET | Freq: Two times a day (BID) | ORAL | Status: DC
  Administered 2014-10-22 – 2014-10-24 (×4): 20 mg via ORAL
  Filled 2014-10-22 (×4): qty 1

## 2014-10-22 MED ORDER — FENTANYL CITRATE (PF) 250 MCG/5ML IJ SOLN
INTRAMUSCULAR | Status: DC | PRN
  Administered 2014-10-22 (×2): 50 ug via INTRAVENOUS

## 2014-10-22 MED ORDER — ENOXAPARIN SODIUM 40 MG/0.4ML ~~LOC~~ SOLN
40.0000 mg | SUBCUTANEOUS | Status: DC
  Administered 2014-10-22 – 2014-10-23 (×2): 40 mg via SUBCUTANEOUS
  Filled 2014-10-22 (×2): qty 0.4

## 2014-10-22 MED ORDER — LIDOCAINE HCL (CARDIAC) 20 MG/ML IV SOLN
INTRAVENOUS | Status: DC | PRN
  Administered 2014-10-22: 80 mg via INTRAVENOUS

## 2014-10-22 MED ORDER — BUPIVACAINE HCL (PF) 0.25 % IJ SOLN
INTRAMUSCULAR | Status: DC | PRN
  Administered 2014-10-22: 30 mL

## 2014-10-22 MED ORDER — DEXAMETHASONE SODIUM PHOSPHATE 4 MG/ML IJ SOLN
8.0000 mg | Freq: Once | INTRAMUSCULAR | Status: DC | PRN
Start: 2014-10-22 — End: 2014-10-22

## 2014-10-22 MED ORDER — OXYCODONE-ACETAMINOPHEN 5-325 MG PO TABS
1.0000 | ORAL_TABLET | Freq: Four times a day (QID) | ORAL | Status: DC | PRN
  Administered 2014-10-22 – 2014-10-24 (×4): 1 via ORAL
  Filled 2014-10-22 (×4): qty 1

## 2014-10-22 SURGICAL SUPPLY — 51 items
APPLIER CLIP ROT 10 11.4 M/L (STAPLE) ×2
BAG COUNTER SPONGE EZ (MISCELLANEOUS) IMPLANT
BLADE CLIPPER SURG (BLADE) ×2 IMPLANT
BULB RESERV EVAC DRAIN JP 100C (MISCELLANEOUS) ×2 IMPLANT
CANISTER SUCT 1200ML W/VALVE (MISCELLANEOUS) ×2 IMPLANT
CATH REDDICK CHOLANGI 4FR 50CM (CATHETERS) ×2 IMPLANT
CHLORAPREP W/TINT 26ML (MISCELLANEOUS) ×2 IMPLANT
CLIP APPLIE ROT 10 11.4 M/L (STAPLE) ×1 IMPLANT
CONRAY 60ML FOR OR (MISCELLANEOUS) ×2 IMPLANT
CUTTER LINEAR ENDO 35 ART FLEX (STAPLE) ×2 IMPLANT
DRAIN CHANNEL JP 19F (MISCELLANEOUS) ×2 IMPLANT
DRAPE SHEET LG 3/4 BI-LAMINATE (DRAPES) IMPLANT
DRSG TEGADERM 2-3/8X2-3/4 SM (GAUZE/BANDAGES/DRESSINGS) ×8 IMPLANT
DRSG TELFA 3X8 NADH (GAUZE/BANDAGES/DRESSINGS) ×2 IMPLANT
GLOVE BIO SURGEON STRL SZ7.5 (GLOVE) ×6 IMPLANT
GLOVE INDICATOR 8.0 STRL GRN (GLOVE) ×6 IMPLANT
GOWN STRL REUS W/ TWL LRG LVL3 (GOWN DISPOSABLE) ×3 IMPLANT
GOWN STRL REUS W/TWL LRG LVL3 (GOWN DISPOSABLE) ×3
GRASPER SUT TROCAR 14GX15 (MISCELLANEOUS) ×2 IMPLANT
IRRIGATION STRYKERFLOW (MISCELLANEOUS) ×1 IMPLANT
IRRIGATOR STRYKERFLOW (MISCELLANEOUS) ×2
IV NS 1000ML (IV SOLUTION) ×1
IV NS 1000ML BAXH (IV SOLUTION) ×1 IMPLANT
LABEL OR SOLS (LABEL) ×2 IMPLANT
NDL SAFETY 18GX1.5 (NEEDLE) IMPLANT
NDL SAFETY 25GX1.5 (NEEDLE) IMPLANT
NEEDLE 18GX1X1/2 (RX/OR ONLY) (NEEDLE) IMPLANT
NEEDLE HYPO 25X1 1.5 SAFETY (NEEDLE) IMPLANT
NEEDLE INSUFFLATION 14GA 120MM (NEEDLE) IMPLANT
NS IRRIG 500ML POUR BTL (IV SOLUTION) ×2 IMPLANT
PACK LAP CHOLECYSTECTOMY (MISCELLANEOUS) ×2 IMPLANT
PAD GROUND ADULT SPLIT (MISCELLANEOUS) ×2 IMPLANT
POUCH ENDO CATCH 10MM SPEC (MISCELLANEOUS) ×2 IMPLANT
SCISSORS METZENBAUM CVD 33 (INSTRUMENTS) ×2 IMPLANT
SEAL FOR SCOPE WARMER C3101 (MISCELLANEOUS) IMPLANT
SHEARS HARMONIC ACE PLUS 36CM (ENDOMECHANICALS) ×2 IMPLANT
SLEEVE ADV FIXATION 5X100MM (TROCAR) ×2 IMPLANT
SPONGE DRAIN TRACH 4X4 STRL 2S (GAUZE/BANDAGES/DRESSINGS) ×2 IMPLANT
STRAP SAFETY BODY (MISCELLANEOUS) ×2 IMPLANT
SUT ETHILON 5-0 (SUTURE) ×1
SUT ETHILON 5-0 C-3 18XMFL BLK (SUTURE) ×1
SUT ETHILON 5-0 FS-2 18 BLK (SUTURE) ×2 IMPLANT
SUT VIC AB 0 CT2 27 (SUTURE) ×2 IMPLANT
SUTURE ETHLN 5-0 C3 18XMF BLK (SUTURE) ×1 IMPLANT
SYR 3ML LL SCALE MARK (SYRINGE) ×2 IMPLANT
TROCAR XCEL 12X100 BLDLESS (ENDOMECHANICALS) ×2 IMPLANT
TROCAR Z-THREAD FIOS 11X100 BL (TROCAR) ×2 IMPLANT
TROCAR Z-THREAD OPTICAL 5X100M (TROCAR) ×2 IMPLANT
TROCAR Z-THREAD SLEEVE 11X100 (TROCAR) ×2 IMPLANT
TUBING INSUFFLATOR HI FLOW (MISCELLANEOUS) ×2 IMPLANT
WATER STERILE IRR 1000ML POUR (IV SOLUTION) IMPLANT

## 2014-10-22 NOTE — Progress Notes (Signed)
Preoperative Review   Patient is met in the preoperative holding area. The history is reviewed in the chart and with the patient. I personally reviewed the options and rationale as well as the risks of this procedure that have been previously discussed with the patient. All questions asked by the patient and/or family were answered to their satisfaction.  Patient agrees to proceed with this procedure at this time.  Dia Crawford MD FACS

## 2014-10-22 NOTE — Anesthesia Postprocedure Evaluation (Signed)
  Anesthesia Post-op Note  Patient: [name]  Procedure(s) Performed: Procedure(s): LAPAROSCOPIC CHOLECYSTECTOMY WITH INTRAOPERATIVE CHOLANGIOGRAM (N/A)  Anesthesia type:General  Patient location: PACU  Post pain: Pain level controlled  Post assessment: Post-op Vital signs reviewed, Patient's Cardiovascular Status Stable, Respiratory Function Stable, Patent Airway and No signs of Nausea or vomiting  Post vital signs: Reviewed and stable  Last Vitals:  Filed Vitals:   10/22/14 1510  BP: 166/88  Pulse: 50  Temp:   Resp:     Level of consciousness: awake, alert  and patient cooperative  Complications: No apparent anesthesia complications

## 2014-10-22 NOTE — Anesthesia Procedure Notes (Signed)
Procedure Name: Intubation Date/Time: 10/22/2014 1:24 PM Performed by: Delaney Meigs Pre-anesthesia Checklist: Patient identified, Emergency Drugs available, Suction available, Patient being monitored and Timeout performed Patient Re-evaluated:Patient Re-evaluated prior to inductionOxygen Delivery Method: Circle system utilized Preoxygenation: Pre-oxygenation with 100% oxygen Intubation Type: IV induction Ventilation: Mask ventilation without difficulty Laryngoscope Size: Mac and 3 Grade View: Grade I Tube type: Oral Number of attempts: 1 Airway Equipment and Method: Stylet Placement Confirmation: ETT inserted through vocal cords under direct vision,  positive ETCO2 and breath sounds checked- equal and bilateral Secured at: 22 cm Tube secured with: Tape Dental Injury: Teeth and Oropharynx as per pre-operative assessment

## 2014-10-22 NOTE — Transfer of Care (Signed)
  Immediate Anesthesia Transfer of Care Note  Patient: [name]  Procedure(s) Performed: Procedure(s): LAPAROSCOPIC CHOLECYSTECTOMY WITH INTRAOPERATIVE CHOLANGIOGRAM (N/A)  Patient Location: PACU  Anesthesia Type:General  Level of Consciousness: sedated  Airway & Oxygen Therapy: Patient Spontanous Breathing and Patient connected to face mask oxygen  Post-op Assessment: Report given to RN and Post -op Vital signs reviewed and stable  Post vital signs: Reviewed and stable  Last Vitals:  Filed Vitals:   10/22/14 1455  BP: 149/89  Pulse: 54  Temp: 37.1 C  Resp: 11    Complications: No apparent anesthesia complications

## 2014-10-22 NOTE — Progress Notes (Signed)
[facility] NAME: [name]    MR#:  [id number]  DATE OF BIRTH:  [birth year]  SUBJECTIVE:  Denies any complaints. Wife in the room Admitted for abdominal pain  REVIEW OF SYSTEMS:   Review of Systems  Constitutional: Negative for fever, chills and weight loss.  HENT: Negative for ear discharge, ear pain and nosebleeds.   Eyes: Negative for blurred vision, pain and discharge.  Respiratory: Negative for sputum production, shortness of breath, wheezing and stridor.   Cardiovascular: Negative for chest pain, palpitations, orthopnea and PND.  Gastrointestinal: Negative for nausea, vomiting, abdominal pain and diarrhea.  Genitourinary: Negative for urgency and frequency.  Musculoskeletal: Negative for back pain and joint pain.  Neurological: Positive for weakness. Negative for sensory change, speech change and focal weakness.  Psychiatric/Behavioral: Negative for depression. The patient is not nervous/anxious.   All other systems reviewed and are negative.  Tolerating Diet:npo for sx  DRUG ALLERGIES:  No Known Allergies  VITALS:  Blood pressure 143/70, pulse 44, temperature 98.6 F (37 C), temperature source Oral, resp. rate 14, height 5\' 10"  (1.778 m), weight 82.01 kg (180 lb 12.8 oz), SpO2 99 %.  PHYSICAL EXAMINATION:   Physical Exam  GENERAL:  64 y.o.-year-old patient lying in the bed with no acute distress.  EYES: Pupils equal, round, reactive to light and accommodation. No scleral icterus. Extraocular muscles intact.  HEENT: Head atraumatic, normocephalic. Oropharynx and nasopharynx clear.  NECK:  Supple, no jugular venous distention. No thyroid enlargement, no tenderness.  LUNGS: Normal breath sounds bilaterally, no wheezing, rales, rhonchi. No use of accessory muscles of respiration.  CARDIOVASCULAR: S1, S2 normal. No murmurs, rubs, or gallops.  ABDOMEN: Soft, nontender, nondistended. Bowel sounds present. No  organomegaly or mass.  EXTREMITIES: No cyanosis, clubbing or edema b/l.    NEUROLOGIC: Cranial nerves II through XII are intact. No focal Motor or sensory deficits b/l.  ge n weakness, tremors+ PSYCHIATRIC: The patient is alert and oriented x 3.  SKIN: No obvious rash, lesion, or ulcer.    LABORATORY PANEL:   CBC  Recent Labs Lab 10/22/14 0607  WBC 7.5  HGB 13.7  HCT 40.6  PLT 157    Chemistries   Recent Labs Lab 10/20/14 2232 10/21/14 0656 10/22/14 0607  NA 140  --   --   K 3.6  --   --   CL 102  --   --   CO2 27  --   --   GLUCOSE 118*  --   --   BUN 16  --   --   CREATININE 0.86 0.89  --   CALCIUM 9.0  --   --   AST 97*  --  73*  ALT 61  --  61  ALKPHOS 116  --  139*  BILITOT 1.0  --  0.9    Cardiac Enzymes No results for input(s): TROPONINI in the last 168 hours.  RADIOLOGY:  Ct Abdomen Pelvis W Contrast  10/21/2014   CLINICAL DATA:  Right upper quadrant and epigastric pain.  EXAM: CT ABDOMEN AND PELVIS WITH CONTRAST  TECHNIQUE: Multidetector CT imaging of the abdomen and pelvis was performed using the standard protocol following bolus administration of intravenous contrast.  CONTRAST:  184mL OMNIPAQUE IOHEXOL 300 MG/ML  SOLN  COMPARISON:  None.  FINDINGS: BODY WALL: No contributory findings.  LOWER CHEST: No acute findings.  Multi focal coronary atherosclerosis.  ABDOMEN/PELVIS:  Liver: No focal abnormality.  Biliary:  There is calcification peripherally about the fundus and body of the gallbladder which appears mural. There is multi focal wasting of the gallbladder which suggests adenomyomatosis.  Pancreas: Unremarkable.  Spleen: Chronic granulomatous changes.  Adrenals: Unremarkable.  Kidneys and ureters: Nonobstructive left nephrolithiasis, with the largest stone in the lower pole left kidney measuring 12 x 6 mm. No hydronephrosis or ureteral calculus  Bladder: Unremarkable.  Reproductive: No pathologic findings.  Bowel: No obstruction. No appendicitis.   Retroperitoneum: No mass or adenopathy.  Peritoneum: No ascites or pneumoperitoneum.  Vascular: No acute abnormality.  OSSEOUS: L4-5 discectomy with posterior fixation. Bony fusion appears complete. There has also been a L1 decompressive laminectomy. No acute osseous findings.  IMPRESSION: 1. Gallbladder calcification which favors porcelain gallbladder over cholelithiasis. No superimposed acute inflammation to explain acute abdominal pain. Surgical referral is recommended. 2. Nonobstructive left nephrolithiasis.   Electronically Signed   By: Monte Fantasia M.D.   On: 10/21/2014 00:41   US Abdomen Limited Ruq  10/21/2014   CLINICAL DATA:  Suspected cholecystitis, abdominal pain and nausea without vomiting for the past day  EXAM: US ABDOMEN LIMITED - RIGHT UPPER QUADRANT  COMPARISON:  CT scan of the abdomen and pelvis of today's date  FINDINGS: Gallbladder:  The gallbladder is poorly evaluated using the ultrasound technique due to mural calcification or contraction and multiple stones. There may be a stone in the gallbladder neck but again visualization is limited. No pericholecystic a fluid is observed.  Common bile duct:  Diameter: 2.6 mm  Liver:  The liver exhibits normal echotexture with no focal mass or ductal dilation.  IMPRESSION: There is an abnormal appearance of the gallbladder which suggests a wall echo shadow sign seen with a contracted gallbladder containing multiple stones. However, given the appearance appearance on the earlier CT today a "porcelain" gallbladder due to mural CT with or without stones is felt more likely.   Electronically Signed   By: David  Martinique M.D.   On: 10/21/2014 09:25     ASSESSMENT AND PLAN:   1. Chronic asymptomatic bradycardia. Looking through the records patient was seen in the past by Dr. Rockey Situ prior to a back surgery in February 2016 for bradycardia. The patient's bradycardia has been going on for a long time and he is asymptomatic. Patient is not on any cardiac  medications that would cause bradycardia.  -will not change any of the patient's Parkinson's medications. No further cardiac testing is needed prior to surgery. Patient is a moderate risk for a moderate risk surgery. No contraindications to surgery at this time.  2. 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 Surgery scheduled for today for gallbladder removal.   3. Parkinson's disease-  will continue his medications as he usually takes them.  4. Hyperlipidemia unspecified continue pravastatin.  5. Essential hypertension- continue on hydrochlorothiazide.  6. Anxiety and depression- continue Wellbutrin.  Case discussed with Care Management/Social Worker. Management plans discussed with the patient, family and they are in agreement.  CODE STATUS: full  DVT Prophylaxis: heparin  TOTAL TIME TAKING CARE OF THIS PATIENT: 35 minutes.  >50% time spent on counselling and coordination of care  Trinda Harlacher M.D on 10/22/2014 at 12:24 PM  Between 7am to 6pm - Pager - [phone]  After 6pm go to [email or web address] - password [email or web address] [facility]  Office  [phone]  CC: Primary care physician; Elsie Stain, MD

## 2014-10-22 NOTE — Op Note (Signed)
10/20/2014 - 10/22/2014  2:57 PM  PATIENT:  [name]  64 y.o. male  PRE-OPERATIVE DIAGNOSIS:  Cholecystitis  POST-OPERATIVE DIAGNOSIS:  cholecystitis  PROCEDURE:  Procedure(s): LAPAROSCOPIC CHOLECYSTECTOMY WITH INTRAOPERATIVE CHOLANGIOGRAM (N/A)  SURGEON:  Surgeon(s) and Role:    * Dia Crawford III, MD - Primary   ASSISTANTS: none   ANESTHESIA:   general  EBL:  Total I/O In: 900 [I.V.:900] Out: -    DRAINS: (1) Jackson-Pratt drain(s) with closed bulb suction in the Gallbladder fossa   LOCAL MEDICATIONS USED:  MARCAINE      DISPOSITION OF SPECIMEN:  PATHOLOGY   DICTATION: .Dragon Dictation With the patient in the supine position and after induction of appropriate general anesthesia, the patient's abdomen was prepped with ChloraPrep and draped sterile towels. The patient was placed in headdown feet up position. A small infraumbilical incision was made standard fashion carried down bluntly through subcutaneous tissue. Veress needle was used to cannulate peritoneal cavity. CO2 was insufflated to appropriate pressure measurements. Graft approximately 2 L of CO2 were instilled a varies needle was withdrawn and an 11 mm medical port inserted into the peritoneal cavity intraperitoneal position was confirmed and CO2 was reinsufflated.  Patient was then turned in head up feet down position rotated slightly to the left side. Subxiphoid transverse incision was made 11 mm port inserted under direct vision. 2 lateral ports 5 mm in size were inserted under direct vision. The gallbladder was identified and had multiple adhesions to it was markedly hard and minimally inflamed. The lower lateral port was exchanged for a 10 mm port and the grasping claw was used to elevate the gallbladder.  Dissection was carried out along the hepatoduodenal ligament. Cystic artery was identified. The cystic duct was identified. The right hepatic artery was visualized was quite large. Care was taken to avoid the  right hepatic artery. Because of the firmness of the duct and the large size of the cystic duct the epigastric port was exchanged for a 12 and the Endo GIA stapler used to divide the cystic duct.  The cystic artery was then carefully dissected free from the right hepatic artery doubly clipped and divided. With the patient having a deep brain stimulator for his Parkinson's disease, we made the decision to use the Harmonic scalpel and the gallbladder was removed from the bed of the liver using Harmonic scalpel. The gallbladder was captured in an Endo Catch apparatus removed through the subxiphoid incision. A Jackson-Pratt drain was placed through the upper abdominal incision and brought out through one of the lateral stab wounds placing in the gallbladder fossa.  The abdomen was then copiously irrigated with warm saline solution. The upper midline incision was closed using the suture passer and 0 Vicryl. Skin incisions were all closed with 3 and 5-0 nylon and the drain was secured with 3-0 nylon. Sterile dressings were applied. The patient was returned recovery room having tolerated procedure well. 0.25% Marcaine was used for postoperative pain control. Sponge instrument needle count were correct 2 in the operating room PLAN OF CARE: Admit to inpatient   PATIENT DISPOSITION:  PACU - hemodynamically stable.   Dia Crawford III, MD

## 2014-10-23 ENCOUNTER — Ambulatory Visit: Payer: Medicare PPO | Admitting: Psychology

## 2014-10-23 ENCOUNTER — Ambulatory Visit: Payer: Medicare PPO | Admitting: Occupational Therapy

## 2014-10-23 DIAGNOSIS — K8000 Calculus of gallbladder with acute cholecystitis without obstruction: Secondary | ICD-10-CM | POA: Diagnosis not present

## 2014-10-23 MED ORDER — HALOPERIDOL LACTATE 5 MG/ML IJ SOLN: 2.0000 mg | INTRAMUSCULAR | Status: DC | PRN

## 2014-10-23 MED ORDER — SENNOSIDES-DOCUSATE SODIUM 8.6-50 MG PO TABS
1.0000 | ORAL_TABLET | Freq: Two times a day (BID) | ORAL | Status: DC
  Administered 2014-10-23 – 2014-10-24 (×3): 1 via ORAL
  Filled 2014-10-23 (×3): qty 1

## 2014-10-23 NOTE — Progress Notes (Signed)
1 Day Post-Op   Subjective:  He is doing well this morning. He continues to have abdominal pain but no nausea or vomiting. Much of his subxiphoid and penetrating pain is improved.  Vital signs in last 24 hours: Temp:  [98.4 F (36.9 C)-99 F (37.2 C)] 99 F (37.2 C) (07/06 2342) Pulse Rate:  [40-70] 52 (07/06 2342) Resp:  [11-16] 16 (07/06 1819) BP: (131-166)/(50-89) 131/53 mmHg (07/06 2342) SpO2:  [95 %-100 %] 95 % (07/06 2342) Last BM Date: 10/20/14  Intake/Output from previous day: 07/06 0701 - 07/07 0700 In: 2810 [P.O.:60; I.V.:2655] Out: 1013 [Urine:850; Drains:163]  GI: His abdomen is soft with minimal superficial drainage and serous drainage in his bulb. He has active bowel sounds.  Lab Results:  CBC  Recent Labs  10/21/14 0656 10/22/14 0607  WBC 7.2 7.5  HGB 14.0 13.7  HCT 40.1 40.6  PLT 163 157   CMP     Component Value Date/Time   NA 140 10/20/2014 2232   NA 142 06/03/2013 1051   NA 136 05/29/2013 0815   K 3.6 10/20/2014 2232   K 3.7 05/29/2013 0815   CL 102 10/20/2014 2232   CL 102 05/29/2013 0815   CO2 27 10/20/2014 2232   CO2 29 05/29/2013 0815   GLUCOSE 118* 10/20/2014 2232   GLUCOSE 109* 06/03/2013 1051   GLUCOSE 134* 05/29/2013 0815   BUN 16 10/20/2014 2232   BUN 20 06/03/2013 1051   BUN 16 05/29/2013 0815   CREATININE 0.89 10/21/2014 0656   CREATININE 0.86 05/29/2013 0815   CALCIUM 9.0 10/20/2014 2232   CALCIUM 8.8 05/29/2013 0815   PROT 6.6 10/22/2014 0607   PROT 7.6 06/03/2013 1051   PROT 7.3 05/29/2013 0815   ALBUMIN 3.8 10/22/2014 0607   ALBUMIN 3.6 05/29/2013 0815   AST 73* 10/22/2014 0607   AST 18 05/29/2013 0815   ALT 61 10/22/2014 0607   ALT 26 05/29/2013 0815   ALKPHOS 139* 10/22/2014 0607   ALKPHOS 85 05/29/2013 0815   BILITOT 0.9 10/22/2014 0607   GFRNONAA >60 10/21/2014 0656   GFRNONAA >60 05/29/2013 0815   GFRAA >60 10/21/2014 0656   GFRAA >60 05/29/2013 0815   PT/INR No results for input(s): LABPROT, INR in the  last 72 hours.  Studies/Results: US Abdomen Limited Ruq  10/21/2014   CLINICAL DATA:  Suspected cholecystitis, abdominal pain and nausea without vomiting for the past day  EXAM: US ABDOMEN LIMITED - RIGHT UPPER QUADRANT  COMPARISON:  CT scan of the abdomen and pelvis of today's date  FINDINGS: Gallbladder:  The gallbladder is poorly evaluated using the ultrasound technique due to mural calcification or contraction and multiple stones. There may be a stone in the gallbladder neck but again visualization is limited. No pericholecystic a fluid is observed.  Common bile duct:  Diameter: 2.6 mm  Liver:  The liver exhibits normal echotexture with no focal mass or ductal dilation.  IMPRESSION: There is an abnormal appearance of the gallbladder which suggests a wall echo shadow sign seen with a contracted gallbladder containing multiple stones. However, given the appearance appearance on the earlier CT today a "porcelain" gallbladder due to mural CT with or without stones is felt more likely.   Electronically Signed   By: David  Martinique M.D.   On: 10/21/2014 09:25    Assessment/Plan: Overall is doing well. There are no particular problems of note. We will slowly increase his activity and increase

## 2014-10-23 NOTE — Progress Notes (Signed)
[name] [name] NAME: [name]    MR#:  [id number]  DATE OF BIRTH:  22-Dec-1950  SUBJECTIVE:  POD#1  Some abdominal pain George surgical site No tremors  REVIEW OF SYSTEMS:   Review of Systems  Constitutional: Negative for fever, chills and weight loss.  HENT: Negative for ear discharge, ear pain and nosebleeds.   Eyes: Negative for blurred vision, pain and discharge.  Respiratory: Negative for sputum production, shortness of breath, wheezing and stridor.   Cardiovascular: Negative for chest pain, palpitations, orthopnea and PND.  Gastrointestinal: Negative for nausea, vomiting, abdominal pain and diarrhea.  Genitourinary: Negative for urgency and frequency.  Musculoskeletal: Negative for back pain and joint pain.  Neurological: Positive for weakness. Negative for sensory change, speech change and focal weakness.  Psychiatric/Behavioral: Negative for depression. The patient is not nervous/anxious.   All other systems reviewed and are negative.  Tolerating Diet: yes  DRUG ALLERGIES:  No Known Allergies  VITALS:  Blood pressure 131/53, pulse 52, temperature 99 F (37.2 C), temperature source Oral, resp. rate 16, height 5\' 10"  (1.778 m), weight 82.01 kg (180 lb 12.8 oz), SpO2 95 %.  PHYSICAL EXAMINATION:   Physical Exam  GENERAL:  64 y.o.-year-old patient lying in the bed with no acute distress.  EYES: Pupils equal, round, reactive to light and accommodation. No scleral icterus. Extraocular muscles intact.  HEENT: Head atraumatic, normocephalic. Oropharynx and nasopharynx clear.  NECK:  Supple, no jugular venous distention. No thyroid enlargement, no tenderness.  LUNGS: Normal breath sounds bilaterally, no wheezing, rales, rhonchi. No use of accessory muscles of respiration.  CARDIOVASCULAR: S1, S2 normal. No murmurs, rubs, or gallops.  ABDOMEN: Soft, nontender, nondistended. Bowel sounds present. No organomegaly or mass.  Surgical scars + EXTREMITIES: No cyanosis, clubbing or edema b/l.    NEUROLOGIC: Cranial nerves II through XII are intact. No focal Motor or sensory deficits b/l.  ge n weakness, tremors+ PSYCHIATRIC: The patient is alert and oriented x 3.  SKIN: No obvious rash, lesion, or ulcer.    LABORATORY PANEL:   CBC  Recent Labs Lab 10/22/14 0607  WBC 7.5  HGB 13.7  HCT 40.6  PLT 157    Chemistries   Recent Labs Lab 10/20/14 2232 10/21/14 0656 10/22/14 0607  NA 140  --   --   K 3.6  --   --   CL 102  --   --   CO2 27  --   --   GLUCOSE 118*  --   --   BUN 16  --   --   CREATININE 0.86 0.89  --   CALCIUM 9.0  --   --   AST 97*  --  73*  ALT 61  --  61  ALKPHOS 116  --  139*  BILITOT 1.0  --  0.9    ASSESSMENT AND PLAN:   1. Chronic asymptomatic bradycardia. Looking through the records patient was seen in the past by Dr. Rockey Situ prior to a back surgery in February 2016 for bradycardia. The patient's bradycardia has been going on for a long time and he is asymptomatic. Patient is not on any cardiac medications that would cause bradycardia.  -will not change any of the patient's Parkinson's medications. No further cardiac testing is needed prior to surgery.   2. 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 POD #1 doing well -managed per  Dr Pat Patrick  3. Parkinson's disease-  continue his medications  -He has deep brian stimulator  4. Hyperlipidemia unspecified continue pravastatin.  5. Essential hypertension- continue on hydrochlorothiazide.  6. Anxiety and depression- continue Wellbutrin. -overall medically stable Will sign off. Call if needed  Case discussed with Care Management/Social Worker. Management plans discussed with the patient, family and they are in agreement.  CODE STATUS: full  DVT Prophylaxis: heparin  TOTAL TIME TAKING CARE OF THIS PATIENT: 35 minutes.  >50% time spent on  counselling and coordination of care  Savalas Monje M.D on 10/23/2014 George 12:18 PM  Between 7am to 6pm - Pager - [phone]  After 6pm go to [email or web address] - password [email or web address] [facility]  Office  [phone]  CC: Primary care physician; Elsie Stain, MD

## 2014-10-23 NOTE — Progress Notes (Signed)
Called Dr. Rexene Edison @ 2220 and requested a sitter for patient due to agitation and hallucinations.  Doctor ordered medication for agitation.  Christene Slates   10/23/2014  11:33 PM

## 2014-10-24 ENCOUNTER — Ambulatory Visit: Payer: Medicare PPO | Admitting: Occupational Therapy

## 2014-10-24 DIAGNOSIS — K8000 Calculus of gallbladder with acute cholecystitis without obstruction: Secondary | ICD-10-CM | POA: Diagnosis not present

## 2014-10-24 LAB — SURGICAL PATHOLOGY

## 2014-10-24 MED ORDER — OXYCODONE-ACETAMINOPHEN 5-325 MG PO TABS: 1.0000 | ORAL_TABLET | Freq: Four times a day (QID) | ORAL | Status: DC | PRN

## 2014-10-24 NOTE — Discharge Instructions (Signed)
Laparoscopic Cholecystectomy, Care After  ° °These instructions give you information on caring for yourself after your procedure. Your doctor may also give you more specific instructions. Call your doctor if you have any problems or questions after your procedure.  °HOME CARE  °Change your bandages (dressings) as told by your doctor.  °Keep the wound dry and clean. Wash the wound gently with soap and water. Pat the wound dry with a clean towel.  °Do not take baths, swim, or use hot tubs for 2 weeks, or as told by your doctor.  °Only take medicine as told by your doctor.  °Eat a normal diet as told by your doctor.  °Do not lift anything heavier than 10 pounds (4.5 kg) until your doctor says it is okay.  °Do not play contact sports for 1 week, or as told by your doctor. °GET HELP IF:  °Your wound is red, puffy (swollen), or painful.  °You have yellowish-white fluid (pus) coming from the wound.  °You have fluid draining from the wound for more than 1 day.  °You have a bad smell coming from the wound.  °Your wound breaks open. °GET HELP RIGHT AWAY IF:  °You have trouble breathing.  °You have chest pain.  °You have a fever >101  °You have pain in the shoulders (shoulder strap areas) that is getting worse.  °You feel dizzy or pass out (faint).  °You have severe belly (abdominal) pain.  °You feel sick to your stomach (nauseous) or throw up (vomit) for more than 1 day. ° ° °

## 2014-10-24 NOTE — Discharge Summary (Signed)
Patient ID: [name] MRN: [id number] DOB/AGE: [birth year] 64 y.o.  Admit date: 10/20/2014 Discharge date: 10/24/2014  Discharge Diagnoses:  Cholelithiasis and acute cholecystitis  Procedures Performed: Laparoscopic cholecystectomyfair  Discharged Condition: fair  Hospital Course: He was admitted through the emergency room with symptoms and workup consistent with biliary colic possible acute cholecystitis and biliary obstruction. His CT scan suggested a porcelain gallbladder. Ultrasound performed following morning which confirmed multiple stones. He was seen by the medical service which felt he was a satisfactory candidate for surgical intervention. After appropriate preoperative preparation informed consent he was taken to surgery on the afternoon of 10/22/2014. Underwent laparoscopic cholecystectomy. The procedure was uncomplicated. Drain was placed. He's had good return of appetite and bowel function. He has no significant complaints at the present time. His Jackson-Pratt drain was removed. We'll discharge him home this evening to be followed in the office in 7-10 days time. Breathing activity instructions were given the patient.  Discharge Orders: Discharge Instructions    Diet - low sodium heart healthy    Complete by:  As directed      Increase activity slowly    Complete by:  As directed      Remove dressing in 24 hours    Complete by:  As directed            Disposition: 03-Skilled Nursing Facility  Discharge Medications:  Current facility-administered medications:  .  0.9 %  sodium chloride infusion, , Intravenous, Continuous, Fritzi Mandes, MD, Last Rate: 75 mL/hr at 10/24/14 1337 .  acetaminophen (TYLENOL) tablet 650 mg, 650 mg, Oral, Q6H PRN, 650 mg at 10/24/14 0447 **OR** acetaminophen (TYLENOL) suppository 650 mg, 650 mg, Rectal, Q6H PRN, Marlyce Huge, MD .  buPROPion (WELLBUTRIN XL) 24 hr tablet 300 mg, 300 mg, Oral, Daily, Marlyce Huge, MD, 300 mg at  10/24/14 0942 .  carbidopa-levodopa (SINEMET IR) 25-100 MG per tablet immediate release 2 tablet, 2 tablet, Oral, Q breakfast, 2 tablet at 10/24/14 0942 **AND** carbidopa-levodopa (SINEMET IR) 25-100 MG per tablet immediate release 1 tablet, 1 tablet, Oral, Q1200, 1 tablet at 10/24/14 1228 **AND** carbidopa-levodopa (SINEMET IR) 25-100 MG per tablet immediate release 1 tablet, 1 tablet, Oral, QPM, Marlyce Huge, MD, 1 tablet at 10/23/14 1837 .  enoxaparin (LOVENOX) injection 40 mg, 40 mg, Subcutaneous, Q24H, Fritzi Mandes, MD, 40 mg at 10/23/14 2103 .  famotidine (PEPCID) tablet 20 mg, 20 mg, Oral, BID, Dia Crawford III, MD, 20 mg at 10/24/14 0941 .  haloperidol lactate (HALDOL) injection 2 mg, 2 mg, Intravenous, Q4H PRN, Marlyce Huge, MD .  hydrochlorothiazide (HYDRODIURIL) tablet 25 mg, 25 mg, Oral, Daily, Marlyce Huge, MD, 25 mg at 10/24/14 0941 .  morphine 2 MG/ML injection 2-4 mg, 2-4 mg, Intravenous, Q3H PRN, Marlyce Huge, MD, 2 mg at 10/23/14 1550 .  ondansetron (ZOFRAN) injection 4 mg, 4 mg, Intravenous, Q6H PRN, Marlyce Huge, MD, 4 mg at 10/22/14 1428 .  oxyCODONE-acetaminophen (PERCOCET/ROXICET) 5-325 MG per tablet 1 tablet, 1 tablet, Oral, Q6H PRN, Fritzi Mandes, MD, 1 tablet at 10/24/14 0102 .  pravastatin (PRAVACHOL) tablet 20 mg, 20 mg, Oral, QPM, Dia Crawford III, MD, 20 mg at 10/23/14 1837 .  senna-docusate (Senokot-S) tablet 1 tablet, 1 tablet, Oral, BID, Fritzi Mandes, MD, 1 tablet at 10/24/14 0941  Follwup: Follow-up Information    Follow up with [facility] In 1 week.   Contact information:   [place] [place] [phone]  Signed: Dia Crawford III 10/24/2014, 5:08 PM

## 2014-10-24 NOTE — Care Management (Signed)
Spoke with patient and spouse concerning discharge planning. Patient is from home with spouse and has Environmental consultant at home. Spouse states that patient has been attending [facility] program for Parkinson's disorder. Patient was A and O and answered my questions appropriately. Anticipate patient discharge soon. Asked patient about low heart rate and his spouse stated that he has a rate in 40s and sometimes as low as 30s. Patient is being followed closely by his cardiologist. Patient has Deep brain stimulator or Parkinsons.

## 2014-10-24 NOTE — Progress Notes (Signed)
Pt d/c home; d/c instructions reviewed w/ pt; pt understanding was verbalized; IV removed catheter in tact, gauze dressing applied; all pt questions answered; pt left unit via wheelchair accompanied by staff 

## 2014-10-27 ENCOUNTER — Ambulatory Visit: Payer: Medicare PPO | Admitting: Occupational Therapy

## 2014-10-28 ENCOUNTER — Other Ambulatory Visit: Payer: Self-pay | Admitting: Neurology

## 2014-10-28 ENCOUNTER — Ambulatory Visit: Payer: Medicare PPO | Admitting: Occupational Therapy

## 2014-10-28 NOTE — Telephone Encounter (Signed)
Wellbutrin refill requested. Per last office note- patient to remain on medication. Refill approved and sent to patient's pharmacy.

## 2014-10-29 ENCOUNTER — Ambulatory Visit: Payer: Medicare PPO | Admitting: Occupational Therapy

## 2014-10-30 ENCOUNTER — Ambulatory Visit: Payer: Medicare PPO | Admitting: Occupational Therapy

## 2014-11-03 ENCOUNTER — Ambulatory Visit (INDEPENDENT_AMBULATORY_CARE_PROVIDER_SITE_OTHER): Payer: Medicare PPO | Admitting: Surgery

## 2014-11-03 ENCOUNTER — Encounter: Payer: Self-pay | Admitting: Surgery

## 2014-11-03 ENCOUNTER — Ambulatory Visit: Payer: Medicare PPO | Admitting: Occupational Therapy

## 2014-11-03 VITALS — BP 134/77 | HR 55 | Temp 97.0°F | Ht 69.0 in | Wt 175.0 lb

## 2014-11-03 DIAGNOSIS — K828 Other specified diseases of gallbladder: Secondary | ICD-10-CM

## 2014-11-03 NOTE — Progress Notes (Signed)
Subjective:     Patient ID: [name], male   DOB: 02/18/1951, 64 y.o.   MRN: [id number]  HPI  64 year old male with a history of Parkinson's disease status post laparoscopic cholecystectomy with cholangiography on July 6. He is doing well. He is stating he is having some incisional pain around his epigastric incision with deep breaths. He also states he is having some tingling of his right leg however his wife admits he has had 3 prior back operations.  Review of Systems  Constitutional: Negative.   Gastrointestinal: Positive for abdominal pain.  All other systems reviewed and are negative.      Objective:   Physical Exam  Constitutional: He is oriented to person, place, and time. He appears well-nourished.  Abdominal: Soft. There is no tenderness. There is no rebound and no guarding.  Neurological: He is oriented to person, place, and time.       Assessment:     Doing well 12 days status post lap cholecystectomy.    Plan:     When necessary follow-up.

## 2014-11-03 NOTE — Patient Instructions (Signed)
You may resume normal activities in approximately 1 more week.  If you develop any questions or concerns please call our office.   No further follow-up needed at this time.

## 2014-11-05 ENCOUNTER — Ambulatory Visit: Payer: Medicare PPO | Admitting: Occupational Therapy

## 2014-11-05 ENCOUNTER — Ambulatory Visit (INDEPENDENT_AMBULATORY_CARE_PROVIDER_SITE_OTHER): Payer: Medicare PPO | Admitting: Psychology

## 2014-11-05 DIAGNOSIS — F321 Major depressive disorder, single episode, moderate: Secondary | ICD-10-CM

## 2014-11-06 ENCOUNTER — Ambulatory Visit: Payer: Medicare PPO | Admitting: Occupational Therapy

## 2014-11-07 ENCOUNTER — Ambulatory Visit: Payer: Medicare PPO | Admitting: Occupational Therapy

## 2014-11-28 ENCOUNTER — Ambulatory Visit (INDEPENDENT_AMBULATORY_CARE_PROVIDER_SITE_OTHER): Payer: Medicare PPO | Admitting: Neurology

## 2014-11-28 ENCOUNTER — Encounter: Payer: Self-pay | Admitting: Neurology

## 2014-11-28 VITALS — BP 118/70 | HR 47 | Ht 69.0 in | Wt 166.0 lb

## 2014-11-28 DIAGNOSIS — Z9889 Other specified postprocedural states: Secondary | ICD-10-CM

## 2014-11-28 DIAGNOSIS — Z9689 Presence of other specified functional implants: Secondary | ICD-10-CM

## 2014-11-28 DIAGNOSIS — G20 Parkinson's disease: Secondary | ICD-10-CM

## 2014-11-28 DIAGNOSIS — G4752 REM sleep behavior disorder: Secondary | ICD-10-CM

## 2014-11-28 DIAGNOSIS — F330 Major depressive disorder, recurrent, mild: Secondary | ICD-10-CM

## 2014-11-28 NOTE — Progress Notes (Signed)
[name] was seen today in the movement disorders clinic for neurologic consultation at the request of Dr. Annette Stable. His PCP is at the [facility] in [place].  The consultation is for the evaluation of Parkinson's disease.  The patient is accompanied by his wife who supplements the history.  The patient previously saw Dr. Jennelle Human at [facility] and I do have several of those records.  Patient was diagnosed with Parkinson's disease about 15 years ago, at the age of 74-60 years old.  He states that the first sx was not swinging of the R arm and some minor balance changes.  Balance has gotten worse with time.   The patient is currently on carbidopa/levodopa 25/100, 3 at 9am, 2 at 1, 3 at 5pm, 2 at 9 pm and 1 carbidopa/levodopa 25/100 CR at bedtime.  He is also on Mirapex 0.5 mg 3 times a day and selegiline 5 mg daily.  Because of dyskinesia, Dr. Nicki Reaper did talk to him about DBS, but according to Dr. Bary Leriche notes, the patient was skeptical.  He did have a neuropsych evaluation at the [facility] in [place], but it appears that this was more of counseling and no testing.  He did have a nocturnal polysomnogram on 06/20/2010 demonstrating an apnea hypopnea index of 16.  07/11/13 update:  Pt is f/u today re: PD.  This patient is accompanied in the office by his spouse who supplements the history.  He is on carbidopa/levodopa, 3 at 9am, 2 at 1, 3 at 5pm, 2 at 9 pm and takes carbidopa/levodopa CR 25/100  at bedtime.  This is a total daily levodopa dose of 1100 mg per day.  He is on mirapex 0.5 mg three times per day and selegeline 5 mg in the AM.  He states, "I am ready for DBS.  Tell me about what I need to do."  Pt c/o dyskinesias.  Falls on average once per week.  No hallucinations.  No n/v.  No syncope.  States that he saw himself singing at [place] and could not believe how much dyskinesia he had.  He states that when his medications don't work, he "hits a wall."  This is sometimes very unpredictable.  07/24/13:  Present with  wife for on/off test.  Actually went to PT late yesterday afternoon and was off of medication and had a very hard time.  While he has been off medication for 36 hours, his wife reports that she thinks that he has done remarkably well.  There has been minimal tremor and he really hasn't been dragging his leg much.  He expresses desire to proceed with DBS.  10/25/13 update:  Patient presents today with his wife, who supplements the history.  The patient is preop DBS.  He is scheduled for deep brain stimulation surgery on 12/06/2013.  He is scheduled for IPG placement on 12/13/2013.  Since our last visit, he did have neuropsychometric testing.  Results of this were favorable for DBS placement.  It was recommended that he faithfully wear his CPAP.  He just had his preop MRI of the brain done.  Patient is currently taking levodopa 25/100, on the following schedule: 3/2/3/2 and then an additional carbidopa/levodopa 25/100 CR at bedtime.  This is in addition to pramipexole 0.5 mg 3 times a day and selegiline 5 mg in the morning.  Overall doing well.  No hallucinations.  No falls.  Has had some more of his chronic back pain.  01/08/14 update:  The patient  presents today for followup.  He is accompanied by his wife who supplements the history.  The patient underwent bilateral STN DBS on 12/06/2013 with generator placement on 12/13/2013.  The patient's wife reports that his Parkinson's disease symptoms have actually been much better since surgery.  He did go back to taking his previous dose of medication, however.  He is on carbidopa/levodopa 25/100 on the following schedule: 3/2/3/2 and then he takes an additional carbidopa/levodopa 25/100 CR at night.  He is also on Mirapex 0.5 mg 3 times a day as well as selegiline 5 mg in the morning.  He had 2 falls since last visit.  On the one, he actually fell down a few stairs and states that he was actually able to roll onto the carpet.  He did not get hurt.  With the other one,  he states that he was walking on a flat surface and just did not expect there to be a step downward and there was.  Again, he did not get hurt.  Wife reports minor dyskinesia.  They have noted no tremor.  Rigidity has been better.  He has been very sleepy.  Wife thinks that this is because he only sleeps about 4 hours at night because he is trying to do so much during the day.  01/16/14 update:  Patient returns today, accompanied by his wife who supplements the history.  Overall, the patient has been doing well.  He remains very sleepy.  He is not sure if that is from medication.  He refuses to wear the CPAP.  He is supposed to be on a tablet of carbidopa/levodopa 25/100 per day but has dropped down to 6 on his own.  He is on pramipexole 0.5 mg twice per day.  He does not want to start speech therapy until November, because he is going out of town.  He had one fall on the stairs since last visit.  Reports that he has an old house with small steps.  He denies any dyskinesia.  He has had some swelling in the hands, right greater than left.  He has an appointment to discuss this with his primary care physician.  He denies any tremor.    01/22/14 update:  Patient returns today for followup, accompanied by his wife who supplements the history.  Patient called me almost immediately after last visit complaining about dyskinesia and he wanted me to turn his settings back down.  I had told him to instead to decrease his levodopa up to 2 tablets in the morning, one in the afternoon and one in the evening and we had already been weaning his Mirapex.  That stopped the dyskinesias which were in the R arm and he admits that some days he is only taking 3 levodopa per day.  He is sometimes having what sounds like myoclonic jerking in the R arm.  He also c/o "aching" in the joints all over the body "but it doesn't feel like the stiffness of parkinsons disease."  He still has swelling.  He did f/u with PCP but etiology is unknown.     02/17/14 update:  This patient is accompanied in the office by his spouse who supplements the history.  Has had several falls.  Almost fell into the fireplace.  [place] backwards going down the stairs.  No serious injuries but still multiple falls.  Generally occur on the stairs.  Wrecked his [place] as missed the brake.  No tremor but did turn off his device  and notes that tremor will immediately return.  Off the mirapex but still sleeping "all the time."  Wife states that no longer doing the things that he is to work to do.  He is less involved with the church, which he used to be very passionate about.  He is not exercising like he was.  He started speech therapy today.  He had a  Modified barium swallow on 02/05/2014 that demonstrated moderate pharyngeal phase dysphagia and recommended a dysphagia 3 diet (mechanical soft) with nectar thick liquids.  They just got the nectar thick-it today.  His wife thinks that perhaps he is depressed.  She thinks that maybe that is why he is sleeping all the time.the patient also asked me about a referral to orthopedics for a rotator cuff problem on the left.  He is having severe pain, but does not necessarily want surgery.  04/15/14 update:  The patient returns today for follow-up, accompanied by his wife who supplements the history.  He remains on carbidopa/levodopa 25/100, 1 tablet 4 times per day.  He was started on Wellbutrin last visit.  This has been worked up to 300 mg daily.  His mood is much better.  His motivation is much better.  He is sleeping much less.  His balance is not very good.  He has not yet started physical therapy, but plans to next week.  He has finished LSVT loud.  He notices some tremor of the left hand and tremor of the right face.  We did have him try to turn off the DBS to see what would happen with the tremor right face and he got much worse.  He has seen Dr. Maureen Ralphs and had cortisone injections into the right shoulder.  That has helped.  He had an  EMG that demonstrated chronic multilevel radiculopathy from L2-S1 bilaterally.  There was no evidence of peripheral neuropathy.  05/08/13 update:  This patient is accompanied in the office by his spouse who supplements the history.  Overall, the patient is doing well but he is contemplating back surgery.  He saw Dr. Trenton Gammon regarding this and is supposed to have a CT myelogram soon.  He presents today because he is having some tremor of the right face and arm and left arm tremor, especially when he is stressed.  He takes 2 levodopa in the morning, one in the afternoon and one in the evening.  He really does fairly well with that.  Tremor is worse in the evening.  No falls.  He is doing fairly well with Wellbutrin for his mood.  He has put physical therapy on hold.  07/03/14 update:  Patient is accompanied by his wife, who supplements the history.  He had a back surgery on 06/13/2014 by Dr. Trenton Gammon.  Records are reviewed from the surgery.  He had an L1-L2 laminectomy and reexplored the L4-L5 laminectomies with foraminotomies.  The patient was released from rehabilitation on March 11 and subsequently came to our Parkinson's educational event.  At that event, we talked about the fact that DBS can worsen speech and he thinks that perhaps the device has caused some speech difficulties.  His wife also thinks that his mood has gotten worse, especially since his most recent back surgery.  He is doing better in terms of the back pain.  He is weaning off of gabapentin.  Physical therapy is coming into his house.  He has noticed some left hand and right leg tremor.  07/28/14 update:  Pt is  accompanied by his wife, who supplements the history.  He is continuing to have EDS, which he has had long prior to DBS therapy.  He has OSAS but has not been able to tolerate CPAP therapy.  He has no cardiac disease.  We made significant changes to his programing last visit because of stuttering/speech issues and his wife states this is much  better.  He is going to resume ST because of choking issues and that is to restart tomorrow.  He is having some tremor in the L hand and right shoulder.  When asked about mood, his wife states that "he needs an attitude adjustment some days."  She does state that his driving seems much better after his gabapentin was discontinued.  He is not having any lightheadedness.  No hallucinations.  No confusion.  11/28/14 update:  The patient is following up today, accompanied by his wife who supplements the history.  He is on carbidopa/levodopa 25/100, 2 tablets in the morning, one in the afternoon and one in the evening but admits that he really doesn't take it this way and is lucky to get in 3 per day (hadn't taken any when I saw him today at 11:15 pm).  Last visit, I added Provigil because of excessive daytime hypersomnolence and we ultimately tried to increase it over the phone to twice a day dosing, but by June, the patient had discontinued it because he was doing better from this regard.  He called me on June 6, however complaining about acting out his dreams.  We added clonazepam, 0.5 mg, half a tablet at night.  Only a week later the patient had discontinued as he felt that it perhaps worsened his mood but he also thought that it caused diarrhea.  Not long after that, however, the patient ended up in the emergency room with abdominal pain and was diagnosed with a porcelain gallbladder and had surgery to have his gallbladder removed on 10/21/2014.  His wife states today, however, that the klonopin made him seriously depressed.  He did hallucinate with the morphine in the hospital.  His wife states that they saw someone they hadn't seen in a longtime and they commented how well he looked.  He has started PT several times and had to stop for various reasons.  He attended a support group in [place].     Neuroimaging has  previously been performed.  It is not available for my review today.  PREVIOUS  MEDICATIONS: Sinemet, Sinemet CR, Mirapex and Eldpryl; klonopin (depressed)  ALLERGIES:  No Known Allergies  CURRENT MEDICATIONS:    Medication List       This list is accurate as of: 11/28/14 11:13 AM.  Always use your most recent med list.               buPROPion 300 MG 24 hr tablet  Commonly known as:  WELLBUTRIN XL  TAKE 1 TABLET DAILY     carbidopa-levodopa 25-100 MG per tablet  Commonly known as:  SINEMET IR  Take by mouth. Take 2 tablets by mouth in the morning, 1 tablets in the afternoon and take 1 tablet by mouth in the evening.     cholecalciferol 1000 UNITS tablet  Commonly known as:  VITAMIN D  Take 1,000 Units by mouth daily.     fluticasone 50 MCG/ACT nasal spray  Commonly known as:  FLONASE  Place 2 sprays into both nostrils daily as needed for allergies.     hydrochlorothiazide 25 MG  tablet  Commonly known as:  HYDRODIURIL  Take 1 tablet (25 mg total) by mouth daily.     ketotifen 0.025 % ophthalmic solution  Commonly known as:  ZADITOR  Place 1 drop into both eyes 2 (two) times daily.     naproxen sodium 220 MG tablet  Commonly known as:  ANAPROX  Take 220 mg by mouth 2 (two) times daily with a meal.     oxyCODONE-acetaminophen 5-325 MG per tablet  Commonly known as:  PERCOCET/ROXICET  Take 1 tablet by mouth every 6 (six) hours as needed for moderate pain.     pravastatin 20 MG tablet  Commonly known as:  PRAVACHOL  TAKE 1 TABLET EVERY EVENING         PAST MEDICAL HISTORY:   Past Medical History  Diagnosis Date  . Parkinson's disease     dx'ed 15 years ago  . PTSD (post-traumatic stress disorder)   . Bradycardia   . Hypertension     treated with HCTZ  . Sleep apnea     doesn't use C-pap  . Varicose veins   . Cancer 2013    skin cancer  . Arthritis   . History of chicken pox   . GERD (gastroesophageal reflux disease)   . Dysrhythmia     chronic slow heart rate  . Depression     ptsd  . History of kidney stones   .  Headache(784.0)     tension headaches non recent  . Shortness of breath dyspnea     PAST SURGICAL HISTORY:   Past Surgical History  Procedure Laterality Date  . Cyst removed       from lip as a child  . Skin cancer removed      from ears,   11 lft arm  rt leg 14  . Lumbar laminectomy/decompression microdiscectomy Bilateral 12/14/2012    Procedure: Bilateral lumbar three-four, four-five decompressive laminotomy/foraminotomy;  Surgeon: Charlie Pitter, MD;  Location: [facility];  Service: Neurosurgery;  Laterality: Bilateral;  . Subthalamic stimulator insertion Bilateral 12/06/2013    Procedure: SUBTHALAMIC STIMULATOR INSERTION;  Surgeon: Erline Levine, MD;  Location: [facility];  Service: Neurosurgery;  Laterality: Bilateral;  Bilateral deep brain stimulator placement  . Pulse generator implant Bilateral 12/13/2013    Procedure: Bilateral implantable pulse generator placement;  Surgeon: Erline Levine, MD;  Location: [facility];  Service: Neurosurgery;  Laterality: Bilateral;  Bilateral implantable pulse generator placement  . Cholecystectomy N/A 10/22/2014    Procedure: LAPAROSCOPIC CHOLECYSTECTOMY WITH INTRAOPERATIVE CHOLANGIOGRAM;  Surgeon: Dia Crawford III, MD;  Location: [facility] ORS;  Service: General;  Laterality: N/A;    SOCIAL HISTORY:   Social History   Social History  . Marital Status: Married    Spouse Name: N/A  . Number of Children: N/A  . Years of Education: N/A   Occupational History  . disabled     2001, [employer]  .      search and rescue helicopter   Social History Main Topics  . Smoking status: Never Smoker   . Smokeless tobacco: Former Systems developer  . Alcohol Use: 0.0 oz/week    0 Standard drinks or equivalent per week     Comment: occasional (twice per month)  . Drug Use: No  . Sexual Activity: Not on file   Other Topics Concern  . Not on file   Social History Narrative   Previous Therapist, art, [employer]   H/o PTSD.  Prev search and rescue work   3 kids  from prev relationship.     Married 2014    FAMILY HISTORY:   Family Status  Relation Status Death Age  . Mother Alive     Arthritis, unknown medical hx  . Father Deceased     Lung Cancer  . Sister Alive     1 and one half sister  . Brother Alive   . Son Alive   . Daughter Alive     2    ROS:  A complete 10 system review of systems was obtained and was unremarkable apart from what is mentioned above.  PHYSICAL EXAMINATION:    VITALS:   Filed Vitals:   11/28/14 1107  BP: 118/70  Pulse: 47  Height: 5\' 9"  (1.753 m)  Weight: 166 lb (75.297 kg)    GEN:  The patient appears stated age and is in NAD.   HEENT:  Normocephalic, atraumatic.  The mucous membranes are moist. The superficial temporal arteries are without ropiness or tenderness. Cardiovascular: Regular rate and rhythm Lungs: Clear to auscultation bilaterally   Neurological examination:  Orientation: The patient is alert and oriented x3.  He is awake  Cranial nerves: There is good facial symmetry.  The speech is fluent but dysarthric but little better than previous visits.    Soft palate rises symmetrically and there is no tongue deviation. Hearing is intact to conversational tone. Sensation: Sensation is intact to light touch throughout. Motor: Strength is 5/5 in the bilateral upper and lower extremities.   Shoulder shrug is equal and symmetric.  There is no pronator drift. Deep tendon reflexes: Deferred today.  Movement examination: Tone: There is minimal rigidity in the LUE Abnormal movements: There is mild tremor in the LUE Coordination: There is minor decremation with rapid alternating movements on the left. Gait and Station: The patient has no difficulty arising out of a deep-seated chair without the use of the hands. The patient's stride length is good.  Drags the leg a bit but recently had low back surgey.  DBS programming was performed today which is described in more detail on a separate programming procedural notes.    ASSESSMENT/PLAN:  1.  Parkinsons disease by hx. He has been seeing Dr. Jennelle Human through the [facility].    -The patient is status post bilateral STN DBS on 12/06/2013 with battery placement on 12/13/2013.  -off of mirapex  -talked about faithfully taking carbidopa/levodopa 25/100, 2 in the AM, 1 in the afternoon and 1 in the evening.    -he is off of selegiline.    -refer to PT at [facility].  Pt has started it several times but not completed it. 2.  LBP.   -Status post repeat L4-L5 laminotomy with foraminotomy and L1-L2 laminectomy on 06/13/2014 3.  Dysphagia  -He had a  Modified barium swallow on 02/05/2014 that demonstrated moderate pharyngeal phase dysphagia and recommended a dysphagia 3 diet (mechanical soft) with nectar thick liquids.   4.  OSAS with excessive daytime hypersomnolence.  -We talked about morbidity and mortality associated with untreated sleep apnea.    He was not able to tolerate the cpap despite trying multiple masks.  -EDS some better and off provigil 5.  Depression  -better on Wellbutrin XL, 300 mg  -felt near suicidal on klonopin 6.  RBD  -Still acting out the dreams some, but felt almost near suicidal on clonazepam. 7.  Follow-up in the next few months, sooner should new neurologic issues arise.  Much greater than 50% of this visit  was spent in counseling with the patient and the family.  Total face to face time:  25 min not including programming time

## 2014-11-28 NOTE — Procedures (Signed)
DBS Programming was performed.    Total time spent programming was 30 minutes.  Device was confirmed to be on.  Soft start was confirmed to be on at 8.  Impedences were checked and were within normal limits.  Battery was checked and was determined to be functioning normally and not near the end of life.   This was all documented on a separate neurophysiologic worksheet.  Final settings were as follows:  Left brain electrode:     1-C+           ; Amplitude  4.0   V   ; Pulse width 90 microseconds;   Frequency   130   Hz.  Right brain electrode:     2-C+          ; Amplitude   4.3  V ;  Pulse width 60  microseconds;  Frequency   140    Hz.

## 2014-11-29 ENCOUNTER — Other Ambulatory Visit: Payer: Self-pay | Admitting: Cardiovascular Disease

## 2014-12-11 ENCOUNTER — Ambulatory Visit (INDEPENDENT_AMBULATORY_CARE_PROVIDER_SITE_OTHER): Payer: Medicare PPO | Admitting: Psychology

## 2014-12-11 DIAGNOSIS — F321 Major depressive disorder, single episode, moderate: Secondary | ICD-10-CM | POA: Diagnosis not present

## 2014-12-25 ENCOUNTER — Ambulatory Visit (INDEPENDENT_AMBULATORY_CARE_PROVIDER_SITE_OTHER): Payer: Medicare PPO | Admitting: Psychology

## 2014-12-25 DIAGNOSIS — F321 Major depressive disorder, single episode, moderate: Secondary | ICD-10-CM | POA: Diagnosis not present

## 2014-12-28 ENCOUNTER — Other Ambulatory Visit: Payer: Self-pay | Admitting: Family Medicine

## 2015-01-05 ENCOUNTER — Encounter: Payer: Self-pay | Admitting: Family Medicine

## 2015-01-05 ENCOUNTER — Ambulatory Visit (INDEPENDENT_AMBULATORY_CARE_PROVIDER_SITE_OTHER): Payer: Medicare PPO | Admitting: Family Medicine

## 2015-01-05 VITALS — BP 110/66 | HR 60 | Temp 98.3°F | Wt 181.8 lb

## 2015-01-05 DIAGNOSIS — Z23 Encounter for immunization: Secondary | ICD-10-CM

## 2015-01-05 DIAGNOSIS — F329 Major depressive disorder, single episode, unspecified: Secondary | ICD-10-CM | POA: Diagnosis not present

## 2015-01-05 DIAGNOSIS — M48062 Spinal stenosis, lumbar region with neurogenic claudication: Secondary | ICD-10-CM

## 2015-01-05 DIAGNOSIS — M4806 Spinal stenosis, lumbar region: Secondary | ICD-10-CM

## 2015-01-05 DIAGNOSIS — F32A Depression, unspecified: Secondary | ICD-10-CM

## 2015-01-05 MED ORDER — BUPROPION HCL ER (XL) 450 MG PO TB24: 450.0000 mg | ORAL_TABLET | Freq: Every day | ORAL | Status: DC

## 2015-01-05 NOTE — Progress Notes (Signed)
Pre visit review using our clinic review tool, if applicable. No additional management support is needed unless otherwise documented below in the visit note.  R hip and leg pain.  Pain right when he stands but can happen other times.  Sx started months ago.  No L sided sx.  No weakness.  Pain with some ROM.  Restarts PT soon.  H/o falls but none that precipitated the R leg pain.   Pain is in the posterior R hip, near the pants pocket.  R leg is occ numb in S1 distribution.  No B/B sx. No FCNAVD.    H/o bilateral lumbar three-four, four-five decompressive laminotomy/foraminotomy.  Taking oxycodone ~once daily for pain w/o ADE.  Last talked to Dr. Annette Stable several months ago.  He was prev on gabapentin but was able to get off that after his surgery.    Depression.  Improved from prev baseline but not resolved.  No SI/HI.  Still in counseling, is going to continue.  Taking wellbutrin 300mg  a day w/o ADE.  No SZ hx.  Has been on 300mg  for about 1 year.  He isn't worse recently, just not better recently. Still with lack of motivation.  Napping during the day.  He had talked with T Bauert about follow up re: depression med.    Meds, vitals, and allergies reviewed.   ROS: See HPI.  Otherwise, noncontributory.  nad Flat affect.  Speech at baseline Mmm rrr ctab abd soft Back w/o midline pain but ttp in the R lower lateral paraspinal area with R SLR positive.  No weakness in the BLE but dec in sensation in S1 distribution.

## 2015-01-05 NOTE — Patient Instructions (Signed)
Up the wellbutrin to 450mg  a day and we'll check with Dr. Annette Stable in the meantime.  Update me after you change the dose.  Keep working with counseling.  Take care.  Glad to see you.

## 2015-01-06 ENCOUNTER — Telehealth: Payer: Self-pay | Admitting: Family Medicine

## 2015-01-06 DIAGNOSIS — F32A Depression, unspecified: Secondary | ICD-10-CM | POA: Insufficient documentation

## 2015-01-06 DIAGNOSIS — F329 Major depressive disorder, single episode, unspecified: Secondary | ICD-10-CM | POA: Insufficient documentation

## 2015-01-06 NOTE — Assessment & Plan Note (Signed)
Reasonable to up wellbutrin to 450mg  a day with routine cautions.  Continue with counseling and he'll update me on the higher dose of med.  No SI/HI. Okay for outpatient f/u.  He agrees with plan.

## 2015-01-06 NOTE — Assessment & Plan Note (Signed)
With more pain recently.  Getting ready to restart PT.   Continue prn oxycodone for now, lowest dose possible allowed by pain.  No new meds at this point.  Will ask for input from Dr. Annette Stable.   App help of all involved.  >25 minutes spent in face to face time with patient, >50% spent in counselling or coordination of care.

## 2015-01-06 NOTE — Telephone Encounter (Signed)
Please send OV note to Dr. Annette Stable and call his office for input.  Patient with more R leg pain and numbness.  No weakness.  Getting ready to restart PT.  Taking oxycodone prn.   Does Dr. Annette Stable want to see him back first vs repeat MRI vs have him see how he does with PT? Let me know, app help of all involved.  Thanks.

## 2015-01-07 ENCOUNTER — Encounter: Payer: Self-pay | Admitting: Physical Therapy

## 2015-01-07 ENCOUNTER — Ambulatory Visit: Payer: Medicare PPO | Attending: Neurology | Admitting: Physical Therapy

## 2015-01-07 DIAGNOSIS — R279 Unspecified lack of coordination: Secondary | ICD-10-CM | POA: Diagnosis present

## 2015-01-07 DIAGNOSIS — R296 Repeated falls: Secondary | ICD-10-CM | POA: Diagnosis present

## 2015-01-07 DIAGNOSIS — M6281 Muscle weakness (generalized): Secondary | ICD-10-CM | POA: Insufficient documentation

## 2015-01-07 DIAGNOSIS — R262 Difficulty in walking, not elsewhere classified: Secondary | ICD-10-CM | POA: Diagnosis not present

## 2015-01-07 NOTE — Therapy (Signed)
[place] MAIN [facility] SERVICES [address] [place], [place], [address] Phone: [phone]   Fax:  [phone]  Physical Therapy Evaluation  Patient Details  Name: [name] MRN: [id number] Date of Birth: 08/04/1950 Referring Provider:  Ludwig Clarks, DO  Encounter Date: 01/07/2015      PT End of Session - 01/07/15 0928    Visit Number 1   Number of Visits 17   Date for PT Re-Evaluation 02/16/15   Authorization Type 1   Authorization Time Period 10   PT Start Time 0900   PT Stop Time 1000   PT Time Calculation (min) 60 min   Behavior During Therapy [facility] for tasks assessed/performed      Past Medical History  Diagnosis Date  . Parkinson's disease     dx'ed 15 years ago  . PTSD (post-traumatic stress disorder)   . Bradycardia   . Hypertension     treated with HCTZ  . Sleep apnea     doesn't use C-pap  . Varicose veins   . Cancer 2013    skin cancer  . Arthritis   . History of chicken pox   . GERD (gastroesophageal reflux disease)   . Dysrhythmia     chronic slow heart rate  . Depression     ptsd  . History of kidney stones   . Headache(784.0)     tension headaches non recent  . Shortness of breath dyspnea     Past Surgical History  Procedure Laterality Date  . Cyst removed       from lip as a child  . Skin cancer removed      from ears,   12 lft arm  rt leg 15  . Lumbar laminectomy/decompression microdiscectomy Bilateral 12/14/2012    Procedure: Bilateral lumbar three-four, four-five decompressive laminotomy/foraminotomy;  Surgeon: Charlie Pitter, MD;  Location: [place] NEURO ORS;  Service: Neurosurgery;  Laterality: Bilateral;  . Subthalamic stimulator insertion Bilateral 12/06/2013    Procedure: SUBTHALAMIC STIMULATOR INSERTION;  Surgeon: Erline Levine, MD;  Location: [place] NEURO ORS;  Service: Neurosurgery;  Laterality: Bilateral;  Bilateral deep brain stimulator placement  . Pulse generator implant Bilateral 12/13/2013     Procedure: Bilateral implantable pulse generator placement;  Surgeon: Erline Levine, MD;  Location: [place] NEURO ORS;  Service: Neurosurgery;  Laterality: Bilateral;  Bilateral implantable pulse generator placement  . Cholecystectomy N/A 10/22/2014    Procedure: LAPAROSCOPIC CHOLECYSTECTOMY WITH INTRAOPERATIVE CHOLANGIOGRAM;  Surgeon: Dia Crawford III, MD;  Location: [facility] ORS;  Service: General;  Laterality: N/A;    There were no vitals filed for this visit.  Visit Diagnosis:  Difficulty walking  Muscle weakness (generalized)  Falls frequently  Lack of coordination      Subjective Assessment - 01/07/15 0905    Subjective Patient is having difficulty with balance.            [facility] PT Assessment - 01/07/15 0001    Assessment   Medical Diagnosis Parkinsons   Onset Date/Surgical Date 05/07/14   Hand Dominance Right   Prior Therapy --  1 year ago   Precautions   Precautions Fall   Balance Screen   Has the patient fallen in the past 6 months Yes   How many times? 7   Has the patient had a decrease in activity level because of a fear of falling?  Yes   Is the patient reluctant to leave their home because of a fear of falling?  No  Home Environment   Living Environment Private residence   Living Arrangements Spouse/significant other   Available Help at Discharge Family   Type of [place] entrance   [place] Two level   Alternate Level Stairs-Number of Steps [place] - single point;Other (comment);Grab bars - toilet;Grab bars - tub/shower  u-step   Prior Function   Level of [place] Independent      PAIN: Pain in buttocks and RLE numbness to ankle  POSTURE:  WNL   PROM/AROM: WNL except LUE shoulder abd 120 deg  STRENGTH:  Graded on a 0-5 scale Muscle Group Left Right  Shoulder flex 4 4  Shoulder abd 4 4  Shoulder Ext 4 4  Shoulder IR/ER 4 4  Elbow 4 4  Wrist/hand 4 4  Hip Flex 5 5   Hip Abd 5 5  Hip Add 5 5  Hip Ext 5 5  Hip IR/ER 5 5  Knee Flex 5 5  Knee Ext 5 5  Ankle DF 5 5  Ankle PF    Grip left 24, right 32 lbs   SENSATION:  Intact all extremities except : RLE is numb from thigh to ankle      FUNCTIONAL MOBILITY:  Poor movement quality supine to sit and sit to supine   BALANCE: Poor dynamic and static balance   GAIT: Ambulates with SPC and decreased arm swing with decreased gait speed OUTCOME MEASURES: TEST Outcome Interpretation  5 times sit<>stand 13.51 sec >60 yo, >15 sec indicates increased risk for falls  10 meter walk test    1.08             m/s <1.0 m/s indicates increased risk for falls; limited community ambulator  Timed up and Go     10.50            sec <14 sec indicates increased risk for falls  6 minute walk test  1300              Feet 1000 feet is community Conservator, museum/gallery Assessment 46/56 <36/56 (100% risk for falls), 37-45 (80% risk for falls); 46-51 (>50% risk for falls); 52-55 (lower risk <25% of falls)  9 Hole Peg Test L:  42.42              R: 36.70                         PT Education - 01/07/15 0927    Education provided Yes   Education Details LSVT BIG   Person(s) Educated Patient   Methods Explanation   Comprehension Verbalized understanding             PT Long Term Goals - 01/07/15 0925    PT LONG TERM GOAL #1   Title Patient will increase 10 meter walk test to >1.33.87m/s as to improve gait speed for better community ambulation and to reduce fall risk.  02/16/15   PT LONG TERM GOAL #2   Title Patient (> 75 years old) will complete five times sit to stand test in < 14  seconds indicating an increased LE strength and improved balance  02/16/15   PT LONG TERM GOAL #3   Title Patient will increase Berg Balance score by > 6 points to demonstrate decreased fall risk during functional activities  02/16/15   PT LONG TERM GOAL #4  Title Patient will reduce timed up and go to <11 seconds  to reduce fall risk and demonstrate improved transfer/gait ability.  01/17/15               Plan - 01/24/2015 0929    Clinical Impression Statement Patient is 64 yr old male with parkisnsons disease and unsteady gait. He has frequent  falls and decreased static and dynamic standing balance. He has pain in his right buttocks and numbness in right leg down to his foot.    Pt will benefit from skilled therapeutic intervention in order to improve on the following deficits Abnormal gait;Decreased endurance;Decreased activity tolerance;Pain;Difficulty walking;Decreased mobility;Decreased balance;Decreased range of motion;Decreased coordination   Rehab Potential Good   PT Frequency 4x / week   PT Duration 4 weeks   PT Treatment/Interventions Balance training;Therapeutic exercise;Therapeutic activities;Stair training;Gait training;Functional mobility training;Manual techniques   PT Next Visit Plan LSVT   Consulted and Agree with Plan of Care Patient          G-Codes - Jan 24, 2015 0952    Functional Limitation Mobility: Walking and moving around   Mobility: Walking and Moving Around Current Status [phone]) At least 40 percent but less than 60 percent impaired, limited or restricted   Mobility: Walking and Moving Around Goal Status [phone]) At least 20 percent but less than 40 percent impaired, limited or restricted       Problem List Patient Active Problem List   Diagnosis Date Noted  . Depression 01/06/2015  . Porcelain gallbladder 10/21/2014  . Skin lesion 10/02/2014  . Irritation of eyelid 08/20/2014  . Dupuytren's contracture 08/20/2014  . Cough 07/21/2014  . SOM (serous otitis media) 07/21/2014  . Lumbar stenosis with neurogenic claudication 06/13/2014  . Preop cardiovascular exam 05/28/2014  . S/P deep brain stimulator placement 05/08/2014  . PD (Parkinson's disease) 05/08/2014  . Joint pain 01/22/2014  . Medicare annual wellness visit, initial 01/19/2014  . Advance care  planning 01/19/2014  . Parkinson's disease 12/13/2013  . PTSD (post-traumatic stress disorder) 06/13/2013  . Erectile dysfunction 06/13/2013  . HLD (hyperlipidemia) 06/13/2013  . Hip pain 06/10/2013  . Pain in joint, shoulder region 06/10/2013  . Right leg swelling 06/03/2013  . Essential hypertension 06/03/2013  . Bradycardia by electrocardiogram 06/03/2013  . Obstructive sleep apnea 03/12/2013  . Spinal stenosis, lumbar region, with neurogenic claudication 12/14/2012    Alanson Puls January 24, 2015, 9:57 AM  [place] MAIN [facility] SERVICES [place], [place], [address] Phone: [phone]   Fax:  [phone]

## 2015-01-07 NOTE — Telephone Encounter (Signed)
Office notes faxed to Dr. Annette Stable. Will await input from him on what he suggests.

## 2015-01-07 NOTE — Patient Instructions (Addendum)
PAIN: Pain in buttocks and RLE numbness to ankle  POSTURE:  WNL   PROM/AROM: WNL except LUE shoulder abd 120 deg  STRENGTH:  Graded on a 0-5 scale Muscle Group Left Right  Shoulder flex 4 4  Shoulder abd 4 4  Shoulder Ext 4 4  Shoulder IR/ER 4 4  Elbow 4 4  Wrist/hand 4 4  Hip Flex 5 5  Hip Abd 5 5  Hip Add 5 5  Hip Ext 5 5  Hip IR/ER 5 5  Knee Flex 5 5  Knee Ext 5 5  Ankle DF 5 5  Ankle PF    Grip left 24, right 32 lbs   SENSATION:  Intact all extremities except : RLE is numb from thigh to ankle      FUNCTIONAL MOBILITY:  Poor movement quality supine to sit and sit to supine   BALANCE: Poor dynamic and static balance   GAIT: Ambulates with SPC and decreased arm swing with decreased gait speed OUTCOME MEASURES: TEST Outcome Interpretation  5 times sit<>stand 13.51 sec >60 yo, >15 sec indicates increased risk for falls  10 meter walk test    1.08             m/s <1.0 m/s indicates increased risk for falls; limited community ambulator  Timed up and Go     10.50            sec <14 sec indicates increased risk for falls  6 minute walk test  1300              Feet 1000 feet is community Water quality scientist 46/56 <36/56 (100% risk for falls), 37-45 (80% risk for falls); 46-51 (>50% risk for falls); 52-55 (lower risk <25% of falls)  9 Hole Peg Test L:  42.42              R: 36.70

## 2015-01-08 ENCOUNTER — Telehealth: Payer: Self-pay

## 2015-01-08 DIAGNOSIS — M720 Palmar fascial fibromatosis [Dupuytren]: Secondary | ICD-10-CM

## 2015-01-08 NOTE — Telephone Encounter (Signed)
Mrs Barb request referral to [facility] in [place](phone # [phone]) for nodules on pts lt hand; Dr Damita Dunnings had done referral to ortho but that doctor does not do radiation therapy for the problem with pts hand and [facility] does. Please advise.

## 2015-01-09 NOTE — Telephone Encounter (Signed)
Done. Thanks.

## 2015-01-09 NOTE — Telephone Encounter (Signed)
Called and notified patient of Dr Duncan's comments. Patient verbalized understanding.  

## 2015-01-12 ENCOUNTER — Ambulatory Visit: Payer: Medicare PPO | Admitting: Physical Therapy

## 2015-01-12 ENCOUNTER — Encounter: Payer: Self-pay | Admitting: Physical Therapy

## 2015-01-12 DIAGNOSIS — R296 Repeated falls: Secondary | ICD-10-CM

## 2015-01-12 DIAGNOSIS — R279 Unspecified lack of coordination: Secondary | ICD-10-CM

## 2015-01-12 DIAGNOSIS — R262 Difficulty in walking, not elsewhere classified: Secondary | ICD-10-CM

## 2015-01-12 DIAGNOSIS — M6281 Muscle weakness (generalized): Secondary | ICD-10-CM

## 2015-01-12 NOTE — Patient Instructions (Signed)
Floor to ceiling x 10 reps, side to side 10 reps, step and reach forward x 10 reps, step and reach backwards x 10, step and reach sideways x 10 , Rock and reach forward/backward x 10 , Rock and reach sideways x 10, % functional tasks: 1. Sit to stand 2. Step ups on stool x 10 for balance challenges.3. Step ups from blue foam . 4. Tapping form blue foam. 5. Diagonal tapping left and right. pt continues to require modeling and tactile cuing for head rotation with exercise #7. rock and reach sideways and for leg extension with exercise #2 side to side Patient continues to demonstrates less incoordination of movement with select exercises such as rock and reach and stepping backwards. Patient responds well to verbal and tactile cues to correct form and technique. Patient is able to catch mistakes in technique with incorrect positions and is able remember the start and finish positions. Motor control of LE much improved. Muscle fatigue but no major pain complaints. Min cueing needed to appropriately perform LSVT tasks with leg, hand, and head position. Decreased coordination demonstrated requiring consistent verbal cueing to correct form. Cognitive understanding of task was delayed. Patient continues to demonstrate some in coordination of movement with select exercises such as rock and reach and stepping backwards. Patient responds well to verbal and tactile cues to correct form and technique. CGA to SBA for safety with activities. Uses to increase intensity and amplitude of movements throughout session

## 2015-01-12 NOTE — Therapy (Signed)
[place] MAIN [facility] SERVICES [address] [place], [place], [address] Phone: [phone]   Fax:  [phone]  Physical Therapy Treatment  Patient Details  Name: [name] MRN: [id number] Date of Birth: August 06, 1950 Referring Provider:  Ludwig Clarks, DO  Encounter Date: 01/12/2015      PT End of Session - 01/12/15 0913    Visit Number 2   Number of Visits 17   Date for PT Re-Evaluation 02/16/15   Authorization Type 1   Authorization Time Period 10   PT Start Time 0900   PT Stop Time 1000   PT Time Calculation (min) 60 min   Equipment Utilized During Treatment Gait belt   Behavior During Therapy [facility] for tasks assessed/performed      Past Medical History  Diagnosis Date  . Parkinson's disease     dx'ed 15 years ago  . PTSD (post-traumatic stress disorder)   . Bradycardia   . Hypertension     treated with HCTZ  . Sleep apnea     doesn't use C-pap  . Varicose veins   . Cancer 2013    skin cancer  . Arthritis   . History of chicken pox   . GERD (gastroesophageal reflux disease)   . Dysrhythmia     chronic slow heart rate  . Depression     ptsd  . History of kidney stones   . Headache(784.0)     tension headaches non recent  . Shortness of breath dyspnea     Past Surgical History  Procedure Laterality Date  . Cyst removed       from lip as a child  . Skin cancer removed      from ears,   12 lft arm  rt leg 15  . Lumbar laminectomy/decompression microdiscectomy Bilateral 12/14/2012    Procedure: Bilateral lumbar three-four, four-five decompressive laminotomy/foraminotomy;  Surgeon: Charlie Pitter, MD;  Location: [place] NEURO ORS;  Service: Neurosurgery;  Laterality: Bilateral;  . Subthalamic stimulator insertion Bilateral 12/06/2013    Procedure: SUBTHALAMIC STIMULATOR INSERTION;  Surgeon: Erline Levine, MD;  Location: [place] NEURO ORS;  Service: Neurosurgery;  Laterality: Bilateral;  Bilateral deep brain stimulator placement  .  Pulse generator implant Bilateral 12/13/2013    Procedure: Bilateral implantable pulse generator placement;  Surgeon: Erline Levine, MD;  Location: [place] NEURO ORS;  Service: Neurosurgery;  Laterality: Bilateral;  Bilateral implantable pulse generator placement  . Cholecystectomy N/A 10/22/2014    Procedure: LAPAROSCOPIC CHOLECYSTECTOMY WITH INTRAOPERATIVE CHOLANGIOGRAM;  Surgeon: Dia Crawford III, MD;  Location: [facility] ORS;  Service: General;  Laterality: N/A;    There were no vitals filed for this visit.  Visit Diagnosis:  Muscle weakness (generalized)  Difficulty walking  Falls frequently  Lack of coordination      Subjective Assessment - 01/12/15 0908    Subjective Patient is having difficulty with balance.   Currently in Pain? Yes   Pain Score 9    Pain Location Leg   Pain Orientation Right   Pain Descriptors / Indicators Aching        Floor to ceiling x 10 reps, side to side 10 reps, step and reach forward x 10 reps, step and reach backwards x 10, step and reach sideways x 10 , Rock and reach forward/backward x 10 , Rock and reach sideways x 10, % functional tasks: 1. Sit to stand 2. Step ups on stool x 10 for balance challenges.3. Step ups from blue foam . 4. Tapping  form blue foam. 5. Diagonal tapping left and right. pt continues to require modeling and tactile cuing for head rotation with exercise #7. rock and reach sideways and for leg extension with exercise #2 side to side Patient continues to demonstrates less incoordination of movement with select exercises such as rock and reach and stepping backwards. Patient responds well to verbal and tactile cues to correct form and technique. Patient is able to catch mistakes in technique with incorrect positions and is able remember the start and finish positions. Motor control of LE much improved. Muscle fatigue but no major pain complaints. Min cueing needed to appropriately perform LSVT tasks with leg, hand, and head position. Decreased  coordination demonstrated requiring consistent verbal cueing to correct form. Cognitive understanding of task was delayed. Patient continues to demonstrate some in coordination of movement with select exercises such as rock and reach and stepping backwards. Patient responds well to verbal and tactile cues to correct form and technique. CGA to SBA for safety with activities. Uses to increase intensity and amplitude of movements throughout session                            PT Education - 01/12/15 0912    Education provided Yes   Education Details LSVT BIG   Person(s) Educated Patient   Methods Explanation   Comprehension Verbalized understanding             PT Long Term Goals - 01/07/15 0925    PT LONG TERM GOAL #1   Title Patient will increase 10 meter walk test to >1.33.42m/s as to improve gait speed for better community ambulation and to reduce fall risk.  02/16/15   PT LONG TERM GOAL #2   Title Patient (> 41 years old) will complete five times sit to stand test in < 14  seconds indicating an increased LE strength and improved balance  02/16/15   PT LONG TERM GOAL #3   Title Patient will increase Berg Balance score by > 6 points to demonstrate decreased fall risk during functional activities  02/16/15   PT LONG TERM GOAL #4   Title Patient will reduce timed up and go to <11 seconds to reduce fall risk and demonstrate improved transfer/gait ability.  01/17/15               Plan - 01/12/15 0913    Clinical Impression Statement  pt especially has difficulty with opening his hands up towards the ceiling with multiple exercises and with extending his leg during seated side to side multi-directional reaching activity.    Rehab Potential Good   PT Frequency 4x / week   PT Duration 4 weeks   PT Treatment/Interventions Balance training;Therapeutic exercise;Therapeutic activities;Stair training;Gait training;Functional mobility training;Manual techniques   PT  Next Visit Plan LSVT   Consulted and Agree with Plan of Care Patient        Problem List Patient Active Problem List   Diagnosis Date Noted  . Depression 01/06/2015  . Porcelain gallbladder 10/21/2014  . Skin lesion 10/02/2014  . Irritation of eyelid 08/20/2014  . Dupuytren's contracture 08/20/2014  . Cough 07/21/2014  . SOM (serous otitis media) 07/21/2014  . Lumbar stenosis with neurogenic claudication 06/13/2014  . Preop cardiovascular exam 05/28/2014  . S/P deep brain stimulator placement 05/08/2014  . PD (Parkinson's disease) 05/08/2014  . Joint pain 01/22/2014  . Medicare annual wellness visit, initial 01/19/2014  . Advance care planning 01/19/2014  .  Parkinson's disease 12/13/2013  . PTSD (post-traumatic stress disorder) 06/13/2013  . Erectile dysfunction 06/13/2013  . HLD (hyperlipidemia) 06/13/2013  . Hip pain 06/10/2013  . Pain in joint, shoulder region 06/10/2013  . Right leg swelling 06/03/2013  . Essential hypertension 06/03/2013  . Bradycardia by electrocardiogram 06/03/2013  . Obstructive sleep apnea 03/12/2013  . Spinal stenosis, lumbar region, with neurogenic claudication 12/14/2012    Alanson Puls 01/12/2015, 9:25 AM  [place], [place], [address] Phone: [phone]   Fax:  [phone]

## 2015-01-13 ENCOUNTER — Encounter: Payer: Self-pay | Admitting: Physical Therapy

## 2015-01-13 ENCOUNTER — Ambulatory Visit: Payer: Medicare PPO | Admitting: Physical Therapy

## 2015-01-13 DIAGNOSIS — R279 Unspecified lack of coordination: Secondary | ICD-10-CM

## 2015-01-13 DIAGNOSIS — M6281 Muscle weakness (generalized): Secondary | ICD-10-CM

## 2015-01-13 DIAGNOSIS — R262 Difficulty in walking, not elsewhere classified: Secondary | ICD-10-CM

## 2015-01-13 DIAGNOSIS — R296 Repeated falls: Secondary | ICD-10-CM

## 2015-01-13 NOTE — Patient Instructions (Signed)
Floor to ceiling x 10 reps, side to side 10 reps, step and reach forward x 10 reps, step and reach backwards x 10, step and reach sideways x 10 , Rock and reach forward/backward x 10 , Rock and reach sideways x 10, % functional tasks: 1. Sit to stand 2. Step ups on stool x 10 for balance challenges.3. Step ups from blue foam . 4. Tapping form blue foam. 5. Diagonal tapping left and right.

## 2015-01-13 NOTE — Therapy (Signed)
[place] MAIN [facility] SERVICES [address] [place], [place], [address] Phone: [phone]   Fax:  [phone]  Physical Therapy Treatment  Patient Details  Name: [name] MRN: [id number] Date of Birth: Jan 07, 1951 Referring Provider:  Ludwig Clarks, DO  Encounter Date: 01/13/2015      PT End of Session - 01/13/15 0925    Visit Number 3   Number of Visits 17   Date for PT Re-Evaluation 02/16/15   Authorization Type 3   Authorization Time Period 10   PT Start Time 0900   PT Stop Time 1000   PT Time Calculation (min) 60 min   Equipment Utilized During Treatment Gait belt   Behavior During Therapy [facility] for tasks assessed/performed      Past Medical History  Diagnosis Date  . Parkinson's disease     dx'ed 15 years ago  . PTSD (post-traumatic stress disorder)   . Bradycardia   . Hypertension     treated with HCTZ  . Sleep apnea     doesn't use C-pap  . Varicose veins   . Cancer 2013    skin cancer  . Arthritis   . History of chicken pox   . GERD (gastroesophageal reflux disease)   . Dysrhythmia     chronic slow heart rate  . Depression     ptsd  . History of kidney stones   . Headache(784.0)     tension headaches non recent  . Shortness of breath dyspnea     Past Surgical History  Procedure Laterality Date  . Cyst removed       from lip as a child  . Skin cancer removed      from ears,   12 lft arm  rt leg 15  . Lumbar laminectomy/decompression microdiscectomy Bilateral 12/14/2012    Procedure: Bilateral lumbar three-four, four-five decompressive laminotomy/foraminotomy;  Surgeon: Charlie Pitter, MD;  Location: [place] [facility];  Service: Neurosurgery;  Laterality: Bilateral;  . Subthalamic stimulator insertion Bilateral 12/06/2013    Procedure: SUBTHALAMIC STIMULATOR INSERTION;  Surgeon: Erline Levine, MD;  Location: [place] [facility] ORS;  Service: Neurosurgery;  Laterality: Bilateral;  Bilateral deep brain stimulator placement  .  Pulse generator implant Bilateral 12/13/2013    Procedure: Bilateral implantable pulse generator placement;  Surgeon: Erline Levine, MD;  Location: [place] NEURO ORS;  Service: Neurosurgery;  Laterality: Bilateral;  Bilateral implantable pulse generator placement  . Cholecystectomy N/A 10/22/2014    Procedure: LAPAROSCOPIC CHOLECYSTECTOMY WITH INTRAOPERATIVE CHOLANGIOGRAM;  Surgeon: Dia Crawford III, MD;  Location: [facility] ORS;  Service: General;  Laterality: N/A;    There were no vitals filed for this visit.  Visit Diagnosis:  Muscle weakness (generalized)  Difficulty walking  Falls frequently  Lack of coordination      Subjective Assessment - 01/13/15 0915    Subjective Patient is having difficulty with balance.   Pain Score 6    Pain Location Leg         Floor to ceiling x 10 reps, side to side 10 reps, step and reach forward x 10 reps, step and reach backwards x 10, step and reach sideways x 10 , Rock and reach forward/backward x 10 , Rock and reach sideways x 10, % functional tasks: 1. Sit to stand 2. Step ups on stool x 10 for balance challenges.3. Step ups from blue foam . 4. Tapping form blue foam. 5. Diagonal tapping left and right. Patient continues to demonstrates less incoordination of movement  with select exercises such as rock and reach and stepping backwards. Patient responds well to verbal and tactile cues to correct form and technique. Patient is able to catch mistakes in technique with incorrect positions and is able remember the start and finish positions. Motor control of LE much improved.  Muscle fatigue but no major pain complaints.                          PT Education - 01/12/15 0912    Education provided Yes   Education Details LSVT BIG   Person(s) Educated Patient   Methods Explanation   Comprehension Verbalized understanding             PT Long Term Goals - 01/07/15 0925    PT LONG TERM GOAL #1   Title Patient will increase 10 meter walk  test to >1.33.68m/s as to improve gait speed for better community ambulation and to reduce fall risk.  02/16/15   PT LONG TERM GOAL #2   Title Patient (> 65 years old) will complete five times sit to stand test in < 14  seconds indicating an increased LE strength and improved balance  02/16/15   PT LONG TERM GOAL #3   Title Patient will increase Berg Balance score by > 6 points to demonstrate decreased fall risk during functional activities  02/16/15   PT LONG TERM GOAL #4   Title Patient will reduce timed up and go to <11 seconds to reduce fall risk and demonstrate improved transfer/gait ability.  01/17/15               Plan - 01/13/15 0925    Clinical Impression Statement CGA to SBA for safety with activities.  Uses to increase intensity and amplitude of movements throughout session   Rehab Potential Good   PT Frequency 4x / week   PT Duration 4 weeks   PT Treatment/Interventions Balance training;Therapeutic exercise;Therapeutic activities;Stair training;Gait training;Functional mobility training;Manual techniques   PT Next Visit Plan LSVT   Consulted and Agree with Plan of Care Patient        Problem List Patient Active Problem List   Diagnosis Date Noted  . Depression 01/06/2015  . Porcelain gallbladder 10/21/2014  . Skin lesion 10/02/2014  . Irritation of eyelid 08/20/2014  . Dupuytren's contracture 08/20/2014  . Cough 07/21/2014  . SOM (serous otitis media) 07/21/2014  . Lumbar stenosis with neurogenic claudication 06/13/2014  . Preop cardiovascular exam 05/28/2014  . S/P deep brain stimulator placement 05/08/2014  . PD (Parkinson's disease) 05/08/2014  . Joint pain 01/22/2014  . Medicare annual wellness visit, initial 01/19/2014  . Advance care planning 01/19/2014  . Parkinson's disease 12/13/2013  . PTSD (post-traumatic stress disorder) 06/13/2013  . Erectile dysfunction 06/13/2013  . HLD (hyperlipidemia) 06/13/2013  . Hip pain 06/10/2013  . Pain in joint,  shoulder region 06/10/2013  . Right leg swelling 06/03/2013  . Essential hypertension 06/03/2013  . Bradycardia by electrocardiogram 06/03/2013  . Obstructive sleep apnea 03/12/2013  . Spinal stenosis, lumbar region, with neurogenic claudication 12/14/2012    Alanson Puls 01/13/2015, 10:00 AM  [place], [place], [address] Phone: [phone]   Fax:  [phone]

## 2015-01-14 ENCOUNTER — Encounter: Payer: Self-pay | Admitting: Physical Therapy

## 2015-01-14 ENCOUNTER — Ambulatory Visit: Payer: Medicare PPO | Admitting: Physical Therapy

## 2015-01-14 DIAGNOSIS — M6281 Muscle weakness (generalized): Secondary | ICD-10-CM

## 2015-01-14 DIAGNOSIS — R279 Unspecified lack of coordination: Secondary | ICD-10-CM

## 2015-01-14 DIAGNOSIS — R262 Difficulty in walking, not elsewhere classified: Secondary | ICD-10-CM

## 2015-01-14 DIAGNOSIS — R296 Repeated falls: Secondary | ICD-10-CM

## 2015-01-14 NOTE — Therapy (Signed)
[place] MAIN [facility] SERVICES [address] [place], [place], [address] Phone: [phone]   Fax:  [phone]  Physical Therapy Treatment  Patient Details  Name: [name] MRN: [id number] Date of Birth: 1951-02-26 Referring Provider:  Ludwig Clarks, DO  Encounter Date: 01/14/2015      PT End of Session - 01/14/15 0859    Visit Number 4   Number of Visits 17   Date for PT Re-Evaluation 02/16/15   Authorization Type 4   Authorization Time Period 10   PT Start Time 0900   PT Stop Time 1000   PT Time Calculation (min) 60 min   Equipment Utilized During Treatment Gait belt   Behavior During Therapy [facility] for tasks assessed/performed      Past Medical History  Diagnosis Date  . Parkinson's disease     dx'ed 15 years ago  . PTSD (post-traumatic stress disorder)   . Bradycardia   . Hypertension     treated with HCTZ  . Sleep apnea     doesn't use C-pap  . Varicose veins   . Cancer 2013    skin cancer  . Arthritis   . History of chicken pox   . GERD (gastroesophageal reflux disease)   . Dysrhythmia     chronic slow heart rate  . Depression     ptsd  . History of kidney stones   . Headache(784.0)     tension headaches non recent  . Shortness of breath dyspnea     Past Surgical History  Procedure Laterality Date  . Cyst removed       from lip as a child  . Skin cancer removed      from ears,   12 lft arm  rt leg 15  . Lumbar laminectomy/decompression microdiscectomy Bilateral 12/14/2012    Procedure: Bilateral lumbar three-four, four-five decompressive laminotomy/foraminotomy;  Surgeon: Charlie Pitter, MD;  Location: [place] NEURO ORS;  Service: Neurosurgery;  Laterality: Bilateral;  . Subthalamic stimulator insertion Bilateral 12/06/2013    Procedure: SUBTHALAMIC STIMULATOR INSERTION;  Surgeon: Erline Levine, MD;  Location: [place] NEURO ORS;  Service: Neurosurgery;  Laterality: Bilateral;  Bilateral deep brain stimulator placement  .  Pulse generator implant Bilateral 12/13/2013    Procedure: Bilateral implantable pulse generator placement;  Surgeon: Erline Levine, MD;  Location: [place] NEURO ORS;  Service: Neurosurgery;  Laterality: Bilateral;  Bilateral implantable pulse generator placement  . Cholecystectomy N/A 10/22/2014    Procedure: LAPAROSCOPIC CHOLECYSTECTOMY WITH INTRAOPERATIVE CHOLANGIOGRAM;  Surgeon: Dia Crawford III, MD;  Location: [facility] ORS;  Service: General;  Laterality: N/A;    There were no vitals filed for this visit.  Visit Diagnosis:  Muscle weakness (generalized)  Difficulty walking  Falls frequently  Lack of coordination      Subjective Assessment - 01/14/15 0858    Subjective Patient is having difficulty with balance.   Currently in Pain? Yes   Pain Score 6    Pain Location Leg   Pain Orientation Right   Pain Descriptors / Indicators Aching       Floor to ceiling x 10 reps, side to side 10 reps, step and reach forward x 10 reps, step and reach backwards x 10, step and reach sideways x 10 , Rock and reach forward/backward x 10 , Rock and reach sideways x 10, % functional tasks: 1. Sit to stand 2. Step ups on stool x 10 for balance challenges.3. Step ups from blue foam . 4. Tapping form  blue foam. 5. Diagonal tapping left and right.   Patient continues to demonstrates less incoordination of movement with select exercises such as rock and reach and stepping backwards. Patient responds well to verbal and tactile cues to correct form and technique. Patient is able to catch mistakes in technique with incorrect positions and is able remember the start and finish positions. Motor control of LE much improved.  Muscle fatigue but no major pain complaints. CGA and Min to mod verbal cues used throughout with increased in postural sway and LOB most seen with narrow base of support and while on uneven surfaces. Continues to have balance deficits typical with diagnosis. Patient performs intermediate level exercises  without pain behaviors and needs verbal cuing for postural alignment and head positioning Tactile cues and assistance needed to keep lower leg and knee in neutral to avoid compensations with ankle motions.                         PT Education - 01/14/15 0858    Education provided Yes   Education Details LSVT BIG   Person(s) Educated Patient   Methods Explanation   Comprehension Verbalized understanding             PT Long Term Goals - 01/07/15 0925    PT LONG TERM GOAL #1   Title Patient will increase 10 meter walk test to >1.33.31m/s as to improve gait speed for better community ambulation and to reduce fall risk.  02/16/15   PT LONG TERM GOAL #2   Title Patient (> 49 years old) will complete five times sit to stand test in < 14  seconds indicating an increased LE strength and improved balance  02/16/15   PT LONG TERM GOAL #3   Title Patient will increase Berg Balance score by > 6 points to demonstrate decreased fall risk during functional activities  02/16/15   PT LONG TERM GOAL #4   Title Patient will reduce timed up and go to <11 seconds to reduce fall risk and demonstrate improved transfer/gait ability.  01/17/15               Plan - 01/14/15 0901    Clinical Impression Statement Continues to have balance deficits typical with diagnosis. Patient performs intermediate level exercises without pain behaviors and needs verbal cuing for postural alignment and head positioning.   Rehab Potential Good   PT Frequency 4x / week   PT Duration 4 weeks   PT Treatment/Interventions Balance training;Therapeutic exercise;Therapeutic activities;Stair training;Gait training;Functional mobility training;Manual techniques   PT Next Visit Plan LSVT   Consulted and Agree with Plan of Care Patient        Problem List Patient Active Problem List   Diagnosis Date Noted  . Depression 01/06/2015  . Porcelain gallbladder 10/21/2014  . Skin lesion 10/02/2014  .  Irritation of eyelid 08/20/2014  . Dupuytren's contracture 08/20/2014  . Cough 07/21/2014  . SOM (serous otitis media) 07/21/2014  . Lumbar stenosis with neurogenic claudication 06/13/2014  . Preop cardiovascular exam 05/28/2014  . S/P deep brain stimulator placement 05/08/2014  . PD (Parkinson's disease) 05/08/2014  . Joint pain 01/22/2014  . Medicare annual wellness visit, initial 01/19/2014  . Advance care planning 01/19/2014  . Parkinson's disease 12/13/2013  . PTSD (post-traumatic stress disorder) 06/13/2013  . Erectile dysfunction 06/13/2013  . HLD (hyperlipidemia) 06/13/2013  . Hip pain 06/10/2013  . Pain in joint, shoulder region 06/10/2013  . Right leg swelling 06/03/2013  .  Essential hypertension 06/03/2013  . Bradycardia by electrocardiogram 06/03/2013  . Obstructive sleep apnea 03/12/2013  . Spinal stenosis, lumbar region, with neurogenic claudication 12/14/2012    Alanson Puls 01/14/2015, 9:01 AM  [place] MAIN [facility] SERVICES [place], [place], [address] Phone: [phone]   Fax:  [phone]

## 2015-01-15 ENCOUNTER — Encounter: Payer: Self-pay | Admitting: Physical Therapy

## 2015-01-15 ENCOUNTER — Ambulatory Visit (INDEPENDENT_AMBULATORY_CARE_PROVIDER_SITE_OTHER): Payer: Medicare PPO | Admitting: Psychology

## 2015-01-15 ENCOUNTER — Ambulatory Visit: Payer: Medicare PPO | Admitting: Physical Therapy

## 2015-01-15 DIAGNOSIS — M6281 Muscle weakness (generalized): Secondary | ICD-10-CM

## 2015-01-15 DIAGNOSIS — R279 Unspecified lack of coordination: Secondary | ICD-10-CM

## 2015-01-15 DIAGNOSIS — F321 Major depressive disorder, single episode, moderate: Secondary | ICD-10-CM | POA: Diagnosis not present

## 2015-01-15 DIAGNOSIS — R262 Difficulty in walking, not elsewhere classified: Secondary | ICD-10-CM

## 2015-01-15 DIAGNOSIS — R296 Repeated falls: Secondary | ICD-10-CM

## 2015-01-15 NOTE — Therapy (Signed)
[place] MAIN [facility] SERVICES [address] [place], [place], [address] Phone: [phone]   Fax:  [phone]  Physical Therapy Treatment  Patient Details  Name: [name] MRN: [id number] Date of Birth: Jan 17, 1951 Referring Provider:  Ludwig Clarks, DO  Encounter Date: 01/15/2015      PT End of Session - 01/15/15 1129    Visit Number 5   Number of Visits 17   Date for PT Re-Evaluation 02/16/15   Authorization Type 5   PT Start Time 0900   PT Stop Time 1000   PT Time Calculation (min) 60 min   Equipment Utilized During Treatment Gait belt   Activity Tolerance Patient tolerated treatment well;No increased pain   Behavior During Therapy [facility] for tasks assessed/performed      Past Medical History  Diagnosis Date  . Parkinson's disease     dx'ed 15 years ago  . PTSD (post-traumatic stress disorder)   . Bradycardia   . Hypertension     treated with HCTZ  . Sleep apnea     doesn't use C-pap  . Varicose veins   . Cancer 2013    skin cancer  . Arthritis   . History of chicken pox   . GERD (gastroesophageal reflux disease)   . Dysrhythmia     chronic slow heart rate  . Depression     ptsd  . History of kidney stones   . Headache(784.0)     tension headaches non recent  . Shortness of breath dyspnea     Past Surgical History  Procedure Laterality Date  . Cyst removed       from lip as a child  . Skin cancer removed      from ears,   12 lft arm  rt leg 15  . Lumbar laminectomy/decompression microdiscectomy Bilateral 12/14/2012    Procedure: Bilateral lumbar three-four, four-five decompressive laminotomy/foraminotomy;  Surgeon: Charlie Pitter, MD;  Location: [place] NEURO ORS;  Service: Neurosurgery;  Laterality: Bilateral;  . Subthalamic stimulator insertion Bilateral 12/06/2013    Procedure: SUBTHALAMIC STIMULATOR INSERTION;  Surgeon: Erline Levine, MD;  Location: [place] NEURO ORS;  Service: Neurosurgery;  Laterality: Bilateral;   Bilateral deep brain stimulator placement  . Pulse generator implant Bilateral 12/13/2013    Procedure: Bilateral implantable pulse generator placement;  Surgeon: Erline Levine, MD;  Location: [place] NEURO ORS;  Service: Neurosurgery;  Laterality: Bilateral;  Bilateral implantable pulse generator placement  . Cholecystectomy N/A 10/22/2014    Procedure: LAPAROSCOPIC CHOLECYSTECTOMY WITH INTRAOPERATIVE CHOLANGIOGRAM;  Surgeon: Dia Crawford III, MD;  Location: [facility] ORS;  Service: General;  Laterality: N/A;    There were no vitals filed for this visit.  Visit Diagnosis:  Muscle weakness (generalized)  Difficulty walking  Falls frequently  Lack of coordination      Subjective Assessment - 01/15/15 1126    Subjective Patient is having difficulty with balance.and back and right leg pain.   Currently in Pain? Yes   Pain Score 6    Pain Location Leg   Pain Orientation Right           Floor to ceiling x 10 reps, side to side 10 reps, step and reach forward x 10 reps, step and reach backwards x 10, step and reach sideways x 10 , Rock and reach forward/backward x 10 , Rock and reach sideways x 10, % functional tasks: 1. Sit to stand 2. Step ups on stool x 10 for balance challenges.3. Step ups from  blue foam . 4. Tapping form blue foam. 5. Diagonal tapping left and right.   CGA and Min to mod verbal cues used throughout with increased in postural sway and LOB most seen with narrow base of support and while on uneven surfaces. Continues to have balance deficits typical with diagnosis. Patient performs intermediate level exercises without pain behaviors and needs verbal cuing for postural alignment and head positioning Tactile cues and assistance needed to keep lower leg and knee in neutral to avoid compensations with ankle motions. Min cueing needed to appropriately perform LSVT tasks with leg, hand, and head position. Decreased coordination demonstrated requiring consistent verbal cueing to correct  form. Cognitive understanding of task was delayed. Patient continues to demonstrate some in coordination of movement with select exercises such as rock and reach and stepping backwards. Patient responds well to verbal and tactile cues to correct form and technique.  CGA to SBA for safety with activities.  Uses to increase intensity and amplitude of movements throughout session                       PT Education - 01/15/15 1127    Education provided Yes   Education Details LSVT BIG   Person(s) Educated Patient   Methods Explanation   Comprehension Verbalized understanding             PT Long Term Goals - 01/07/15 0925    PT LONG TERM GOAL #1   Title Patient will increase 10 meter walk test to >1.33.1m/s as to improve gait speed for better community ambulation and to reduce fall risk.  02/16/15   PT LONG TERM GOAL #2   Title Patient (> 65 years old) will complete five times sit to stand test in < 14  seconds indicating an increased LE strength and improved balance  02/16/15   PT LONG TERM GOAL #3   Title Patient will increase Berg Balance score by > 6 points to demonstrate decreased fall risk during functional activities  02/16/15   PT LONG TERM GOAL #4   Title Patient will reduce timed up and go to <11 seconds to reduce fall risk and demonstrate improved transfer/gait ability.  01/17/15               Plan - 01/15/15 1130    Clinical Impression Statement Cuing is needed to stretch the leg out in seated side reaching   Pt will benefit from skilled therapeutic intervention in order to improve on the following deficits Abnormal gait;Decreased endurance;Decreased activity tolerance;Pain;Difficulty walking;Decreased mobility;Decreased balance;Decreased range of motion;Decreased coordination   Rehab Potential Good   PT Frequency 4x / week   PT Duration 4 weeks   PT Treatment/Interventions Balance training;Therapeutic exercise;Therapeutic activities;Stair  training;Gait training;Functional mobility training;Manual techniques   PT Next Visit Plan LSVT   Consulted and Agree with Plan of Care Patient        Problem List Patient Active Problem List   Diagnosis Date Noted  . Depression 01/06/2015  . Porcelain gallbladder 10/21/2014  . Skin lesion 10/02/2014  . Irritation of eyelid 08/20/2014  . Dupuytren's contracture 08/20/2014  . Cough 07/21/2014  . SOM (serous otitis media) 07/21/2014  . Lumbar stenosis with neurogenic claudication 06/13/2014  . Preop cardiovascular exam 05/28/2014  . S/P deep brain stimulator placement 05/08/2014  . PD (Parkinson's disease) 05/08/2014  . Joint pain 01/22/2014  . Medicare annual wellness visit, initial 01/19/2014  . Advance care planning 01/19/2014  . Parkinson's disease 12/13/2013  .  PTSD (post-traumatic stress disorder) 06/13/2013  . Erectile dysfunction 06/13/2013  . HLD (hyperlipidemia) 06/13/2013  . Hip pain 06/10/2013  . Pain in joint, shoulder region 06/10/2013  . Right leg swelling 06/03/2013  . Essential hypertension 06/03/2013  . Bradycardia by electrocardiogram 06/03/2013  . Obstructive sleep apnea 03/12/2013  . Spinal stenosis, lumbar region, with neurogenic claudication 12/14/2012    Alanson Puls 01/15/2015, 11:32 AM  [place], [place], [address] Phone: [phone]   Fax:  [phone]

## 2015-01-19 ENCOUNTER — Ambulatory Visit: Payer: Medicare PPO | Attending: Neurology | Admitting: Physical Therapy

## 2015-01-19 ENCOUNTER — Encounter: Payer: Self-pay | Admitting: Physical Therapy

## 2015-01-19 DIAGNOSIS — R279 Unspecified lack of coordination: Secondary | ICD-10-CM | POA: Diagnosis present

## 2015-01-19 DIAGNOSIS — R296 Repeated falls: Secondary | ICD-10-CM | POA: Diagnosis present

## 2015-01-19 DIAGNOSIS — R262 Difficulty in walking, not elsewhere classified: Secondary | ICD-10-CM | POA: Insufficient documentation

## 2015-01-19 DIAGNOSIS — M6281 Muscle weakness (generalized): Secondary | ICD-10-CM | POA: Insufficient documentation

## 2015-01-19 DIAGNOSIS — R460 Very low level of personal hygiene: Secondary | ICD-10-CM | POA: Insufficient documentation

## 2015-01-19 NOTE — Therapy (Signed)
[place] MAIN [facility] SERVICES [address] [place], [place], [address] Phone: [phone]   Fax:  [phone]  Physical Therapy Treatment  Patient Details  Name: [name] MRN: [id number] Date of Birth: 06-10-1950 Referring Provider:  Ludwig Clarks, DO  Encounter Date: 01/19/2015      PT End of Session - 01/19/15 0910    Visit Number 6   Number of Visits 17   Date for PT Re-Evaluation 02/16/15   Authorization Type 5   PT Start Time 0900   PT Stop Time 1000   PT Time Calculation (min) 60 min   Equipment Utilized During Treatment Gait belt   Activity Tolerance Patient tolerated treatment well;No increased pain   Behavior During Therapy [facility] for tasks assessed/performed      Past Medical History  Diagnosis Date  . Parkinson's disease ([place])     dx'ed 15 years ago  . PTSD (post-traumatic stress disorder)   . Bradycardia   . Hypertension     treated with HCTZ  . Sleep apnea     doesn't use C-pap  . Varicose veins   . Cancer [facility]) 2013    skin cancer  . Arthritis   . History of chicken pox   . GERD (gastroesophageal reflux disease)   . Dysrhythmia     chronic slow heart rate  . Depression     ptsd  . History of kidney stones   . Headache(784.0)     tension headaches non recent  . Shortness of breath dyspnea     Past Surgical History  Procedure Laterality Date  . Cyst removed       from lip as a child  . Skin cancer removed      from ears,   12 lft arm  rt leg 15  . Lumbar laminectomy/decompression microdiscectomy Bilateral 12/14/2012    Procedure: Bilateral lumbar three-four, four-five decompressive laminotomy/foraminotomy;  Surgeon: Charlie Pitter, MD;  Location: [place] NEURO ORS;  Service: Neurosurgery;  Laterality: Bilateral;  . Subthalamic stimulator insertion Bilateral 12/06/2013    Procedure: SUBTHALAMIC STIMULATOR INSERTION;  Surgeon: Erline Levine, MD;  Location: [place] NEURO ORS;  Service: Neurosurgery;  Laterality:  Bilateral;  Bilateral deep brain stimulator placement  . Pulse generator implant Bilateral 12/13/2013    Procedure: Bilateral implantable pulse generator placement;  Surgeon: Erline Levine, MD;  Location: [place] NEURO ORS;  Service: Neurosurgery;  Laterality: Bilateral;  Bilateral implantable pulse generator placement  . Cholecystectomy N/A 10/22/2014    Procedure: LAPAROSCOPIC CHOLECYSTECTOMY WITH INTRAOPERATIVE CHOLANGIOGRAM;  Surgeon: Dia Crawford III, MD;  Location: [facility] ORS;  Service: General;  Laterality: N/A;    There were no vitals filed for this visit.  Visit Diagnosis:  Muscle weakness (generalized)  Difficulty walking  Falls frequently  Lack of coordination      Subjective Assessment - 01/19/15 0909    Subjective Patient is having difficulty with balance.and back and right leg pain.   Currently in Pain? Yes   Pain Location Leg   Pain Orientation Right   Pain Descriptors / Indicators Aching   Pain Onset More than a month ago   Multiple Pain Sites No      Floor to ceiling x 10 reps, side to side 10 reps, step and reach forward x 10 reps, step and reach backwards x 10, step and reach sideways x 10 , Rock and reach forward/backward x 10 , Rock and reach sideways x 10, % functional tasks: 1. Sit to stand  2. Step ups on stool x 10 for balance challenges.3. Step ups from blue foam . 4. Tapping form blue foam. 5. Diagonal tapping left and right. Patient continues to demonstrates less incoordination of movement with select exercises such as rock and reach and stepping backwards. Patient responds well to verbal and tactile cues to correct form and technique. Patient is able to catch mistakes in technique with incorrect positions and is able remember the start and finish positions. Motor control of LE much improved.  Muscle fatigue but no major pain complaints.                           PT Education - 01/19/15 0910    Education provided Yes   Education Details LSVT BIG    Person(s) Educated Patient   Methods Explanation   Comprehension Verbalized understanding             PT Long Term Goals - 01/07/15 0925    PT LONG TERM GOAL #1   Title Patient will increase 10 meter walk test to >1.33.58m/s as to improve gait speed for better community ambulation and to reduce fall risk.  02/16/15   PT LONG TERM GOAL #2   Title Patient (> 60 years old) will complete five times sit to stand test in < 14  seconds indicating an increased LE strength and improved balance  02/16/15   PT LONG TERM GOAL #3   Title Patient will increase Berg Balance score by > 6 points to demonstrate decreased fall risk during functional activities  02/16/15   PT LONG TERM GOAL #4   Title Patient will reduce timed up and go to <11 seconds to reduce fall risk and demonstrate improved transfer/gait ability.  01/17/15               Plan - 01/19/15 0911    Clinical Impression Statement CGA to SBA for safety with activities.  Uses to increase intensity and amplitude of movements throughout session.   Pt will benefit from skilled therapeutic intervention in order to improve on the following deficits Abnormal gait;Decreased endurance;Decreased activity tolerance;Pain;Difficulty walking;Decreased mobility;Decreased balance;Decreased range of motion;Decreased coordination   Rehab Potential Good   PT Frequency 4x / week   PT Duration 4 weeks   PT Treatment/Interventions Balance training;Therapeutic exercise;Therapeutic activities;Stair training;Gait training;Functional mobility training;Manual techniques   PT Next Visit Plan LSVT   Consulted and Agree with Plan of Care Patient        Problem List Patient Active Problem List   Diagnosis Date Noted  . Depression 01/06/2015  . Porcelain gallbladder 10/21/2014  . Skin lesion 10/02/2014  . Irritation of eyelid 08/20/2014  . Dupuytren's contracture 08/20/2014  . Cough 07/21/2014  . SOM (serous otitis media) 07/21/2014  . Lumbar  stenosis with neurogenic claudication 06/13/2014  . Preop cardiovascular exam 05/28/2014  . S/P deep brain stimulator placement 05/08/2014  . PD (Parkinson's disease) ([place]) 05/08/2014  . Joint pain 01/22/2014  . Medicare annual wellness visit, initial 01/19/2014  . Advance care planning 01/19/2014  . Parkinson's disease ([place]) 12/13/2013  . PTSD (post-traumatic stress disorder) 06/13/2013  . Erectile dysfunction 06/13/2013  . HLD (hyperlipidemia) 06/13/2013  . Hip pain 06/10/2013  . Pain in joint, shoulder region 06/10/2013  . Right leg swelling 06/03/2013  . Essential hypertension 06/03/2013  . Bradycardia by electrocardiogram 06/03/2013  . Obstructive sleep apnea 03/12/2013  . Spinal stenosis, lumbar region, with neurogenic claudication 12/14/2012    [place],  Sherryl Barters 01/19/2015, 9:12 AM  [place] MAIN [facility] SERVICES [address] [place], [place], [address] Phone: [phone]   Fax:  [phone]

## 2015-01-20 ENCOUNTER — Encounter: Payer: Self-pay | Admitting: Physical Therapy

## 2015-01-20 ENCOUNTER — Ambulatory Visit: Payer: Medicare PPO | Admitting: Physical Therapy

## 2015-01-20 DIAGNOSIS — M6281 Muscle weakness (generalized): Secondary | ICD-10-CM

## 2015-01-20 DIAGNOSIS — R262 Difficulty in walking, not elsewhere classified: Secondary | ICD-10-CM

## 2015-01-20 DIAGNOSIS — R279 Unspecified lack of coordination: Secondary | ICD-10-CM

## 2015-01-20 DIAGNOSIS — R296 Repeated falls: Secondary | ICD-10-CM

## 2015-01-20 NOTE — Therapy (Signed)
[place] MAIN [facility] SERVICES [address] [place], [place], [address] Phone: [phone]   Fax:  [phone]  Physical Therapy Treatment  Patient Details  Name: [name] MRN: [id number] Date of Birth: 05/16/1950 Referring Provider:  Ludwig Clarks, DO  Encounter Date: 01/20/2015      PT End of Session - 01/20/15 0908    Visit Number 7   Number of Visits 17   Date for PT Re-Evaluation 02/16/15   Authorization Type 7   PT Start Time 0900   PT Stop Time 1000   PT Time Calculation (min) 60 min   Equipment Utilized During Treatment Gait belt   Activity Tolerance Patient tolerated treatment well;No increased pain   Behavior During Therapy [facility] for tasks assessed/performed      Past Medical History  Diagnosis Date  . Parkinson's disease ([place])     dx'ed 15 years ago  . PTSD (post-traumatic stress disorder)   . Bradycardia   . Hypertension     treated with HCTZ  . Sleep apnea     doesn't use C-pap  . Varicose veins   . Cancer [facility]) 2013    skin cancer  . Arthritis   . History of chicken pox   . GERD (gastroesophageal reflux disease)   . Dysrhythmia     chronic slow heart rate  . Depression     ptsd  . History of kidney stones   . Headache(784.0)     tension headaches non recent  . Shortness of breath dyspnea     Past Surgical History  Procedure Laterality Date  . Cyst removed       from lip as a child  . Skin cancer removed      from ears,   12 lft arm  rt leg 15  . Lumbar laminectomy/decompression microdiscectomy Bilateral 12/14/2012    Procedure: Bilateral lumbar three-four, four-five decompressive laminotomy/foraminotomy;  Surgeon: Charlie Pitter, MD;  Location: [place] NEURO ORS;  Service: Neurosurgery;  Laterality: Bilateral;  . Subthalamic stimulator insertion Bilateral 12/06/2013    Procedure: SUBTHALAMIC STIMULATOR INSERTION;  Surgeon: Erline Levine, MD;  Location: [place] NEURO ORS;  Service: Neurosurgery;  Laterality:  Bilateral;  Bilateral deep brain stimulator placement  . Pulse generator implant Bilateral 12/13/2013    Procedure: Bilateral implantable pulse generator placement;  Surgeon: Erline Levine, MD;  Location: [place] NEURO ORS;  Service: Neurosurgery;  Laterality: Bilateral;  Bilateral implantable pulse generator placement  . Cholecystectomy N/A 10/22/2014    Procedure: LAPAROSCOPIC CHOLECYSTECTOMY WITH INTRAOPERATIVE CHOLANGIOGRAM;  Surgeon: Dia Crawford III, MD;  Location: [facility] ORS;  Service: General;  Laterality: N/A;    There were no vitals filed for this visit.  Visit Diagnosis:  Muscle weakness (generalized)  Difficulty walking  Falls frequently  Lack of coordination      Subjective Assessment - 01/20/15 0906    Subjective Patient is having difficulty with balance.and back and right leg pain.   Currently in Pain? Yes   Pain Score 3    Pain Location Leg   Pain Orientation Right   Pain Descriptors / Indicators Aching   Pain Type Chronic pain   Pain Onset More than a month ago   Pain Frequency Intermittent      Floor to ceiling x 10 reps, side to side 10 reps, step and reach forward x 10 reps, step and reach backwards x 10, step and reach sideways x 10 , Rock and reach forward/backward x 10 , Rock and  reach sideways x 10, % functional tasks: 1. Sit to stand 2. Step ups on stool x 10 for balance challenges.3. Step ups from blue foam . 4. Tapping form blue foam. 5. Diagonal tapping left and right.   CGA and Min to mod verbal cues used throughout with increased in postural sway and LOB most seen with narrow base of support and while on uneven surfaces. Continues to have balance deficits typical with diagnosis. Patient performs intermediate level exercises without pain behaviors and needs verbal cuing for postural alignment and head positioning Tactile cues and assistance needed to keep lower leg and knee in neutral to avoid compensations with ankle motions. Min cueing needed to appropriately  perform LSVT tasks with leg, hand, and head position. Decreased coordination demonstrated requiring consistent verbal cueing to correct form. Cognitive understanding of task was delayed. Patient continues to demonstrate some in coordination of movement with select exercises such as rock and reach and stepping backwards. Patient responds well to verbal and tactile cues to correct form and technique. CGA to SBA for safety with activities. Uses to increase intensity and amplitude of movements throughout session                           PT Education - 01/20/15 0907    Education provided Yes   Education Details LSVT BIG   Person(s) Educated Patient   Methods Explanation   Comprehension Verbalized understanding             PT Long Term Goals - 01/07/15 0925    PT LONG TERM GOAL #1   Title Patient will increase 10 meter walk test to >1.33.8m/s as to improve gait speed for better community ambulation and to reduce fall risk.  02/16/15   PT LONG TERM GOAL #2   Title Patient (> 83 years old) will complete five times sit to stand test in < 14  seconds indicating an increased LE strength and improved balance  02/16/15   PT LONG TERM GOAL #3   Title Patient will increase Berg Balance score by > 6 points to demonstrate decreased fall risk during functional activities  02/16/15   PT LONG TERM GOAL #4   Title Patient will reduce timed up and go to <11 seconds to reduce fall risk and demonstrate improved transfer/gait ability.  01/17/15               Plan - 01/20/15 0909    Clinical Impression Statement Continues to have balance deficits typical with diagnosis. Patient performs intermediate level exercises without pain behaviors and needs verbal cuing for postural alignment and head positioning.   Pt will benefit from skilled therapeutic intervention in order to improve on the following deficits Abnormal gait;Decreased endurance;Decreased activity  tolerance;Pain;Difficulty walking;Decreased mobility;Decreased balance;Decreased range of motion;Decreased coordination   Rehab Potential Good   PT Frequency 4x / week   PT Duration 4 weeks   PT Treatment/Interventions Balance training;Therapeutic exercise;Therapeutic activities;Stair training;Gait training;Functional mobility training;Manual techniques   PT Next Visit Plan LSVT   Consulted and Agree with Plan of Care Patient        Problem List Patient Active Problem List   Diagnosis Date Noted  . Depression 01/06/2015  . Porcelain gallbladder 10/21/2014  . Skin lesion 10/02/2014  . Irritation of eyelid 08/20/2014  . Dupuytren's contracture 08/20/2014  . Cough 07/21/2014  . SOM (serous otitis media) 07/21/2014  . Lumbar stenosis with neurogenic claudication 06/13/2014  . Preop cardiovascular exam  05/28/2014  . S/P deep brain stimulator placement 05/08/2014  . PD (Parkinson's disease) ([place]) 05/08/2014  . Joint pain 01/22/2014  . Medicare annual wellness visit, initial 01/19/2014  . Advance care planning 01/19/2014  . Parkinson's disease ([place]) 12/13/2013  . PTSD (post-traumatic stress disorder) 06/13/2013  . Erectile dysfunction 06/13/2013  . HLD (hyperlipidemia) 06/13/2013  . Hip pain 06/10/2013  . Pain in joint, shoulder region 06/10/2013  . Right leg swelling 06/03/2013  . Essential hypertension 06/03/2013  . Bradycardia by electrocardiogram 06/03/2013  . Obstructive sleep apnea 03/12/2013  . Spinal stenosis, lumbar region, with neurogenic claudication 12/14/2012    Alanson Puls 01/20/2015, 9:12 AM  [place], [place], [address] Phone: [phone]   Fax:  [phone]

## 2015-01-21 ENCOUNTER — Encounter: Payer: Self-pay | Admitting: Physical Therapy

## 2015-01-21 ENCOUNTER — Ambulatory Visit: Payer: Medicare PPO | Admitting: Physical Therapy

## 2015-01-21 ENCOUNTER — Encounter: Payer: Self-pay | Admitting: Family Medicine

## 2015-01-21 DIAGNOSIS — M6281 Muscle weakness (generalized): Secondary | ICD-10-CM

## 2015-01-21 DIAGNOSIS — R296 Repeated falls: Secondary | ICD-10-CM

## 2015-01-21 DIAGNOSIS — R262 Difficulty in walking, not elsewhere classified: Secondary | ICD-10-CM

## 2015-01-21 DIAGNOSIS — R279 Unspecified lack of coordination: Secondary | ICD-10-CM

## 2015-01-21 NOTE — Therapy (Signed)
[place] MAIN [facility] SERVICES [address] [place], [place], [address] Phone: [phone]   Fax:  [phone]  Physical Therapy Treatment  Patient Details  Name: [name] MRN: [id number] Date of Birth: 08-May-1950 Referring Provider:  Ludwig Clarks, DO  Encounter Date: 01/21/2015      PT End of Session - 01/21/15 0908    Visit Number 8   Number of Visits 17   Date for PT Re-Evaluation 02/16/15   Authorization Type 7   PT Start Time 0900   PT Stop Time 1000   PT Time Calculation (min) 60 min   Equipment Utilized During Treatment Gait belt   Activity Tolerance Patient tolerated treatment well;No increased pain   Behavior During Therapy [facility] for tasks assessed/performed      Past Medical History  Diagnosis Date  . Parkinson's disease ([place])     dx'ed 15 years ago  . PTSD (post-traumatic stress disorder)   . Bradycardia   . Hypertension     treated with HCTZ  . Sleep apnea     doesn't use C-pap  . Varicose veins   . Cancer [facility]) 2013    skin cancer  . Arthritis   . History of chicken pox   . GERD (gastroesophageal reflux disease)   . Dysrhythmia     chronic slow heart rate  . Depression     ptsd  . History of kidney stones   . Headache(784.0)     tension headaches non recent  . Shortness of breath dyspnea     Past Surgical History  Procedure Laterality Date  . Cyst removed       from lip as a child  . Skin cancer removed      from ears,   12 lft arm  rt leg 15  . Lumbar laminectomy/decompression microdiscectomy Bilateral 12/14/2012    Procedure: Bilateral lumbar three-four, four-five decompressive laminotomy/foraminotomy;  Surgeon: Charlie Pitter, MD;  Location: [place] NEURO ORS;  Service: Neurosurgery;  Laterality: Bilateral;  . Subthalamic stimulator insertion Bilateral 12/06/2013    Procedure: SUBTHALAMIC STIMULATOR INSERTION;  Surgeon: Erline Levine, MD;  Location: [place] NEURO ORS;  Service: Neurosurgery;  Laterality:  Bilateral;  Bilateral deep brain stimulator placement  . Pulse generator implant Bilateral 12/13/2013    Procedure: Bilateral implantable pulse generator placement;  Surgeon: Erline Levine, MD;  Location: [place] NEURO ORS;  Service: Neurosurgery;  Laterality: Bilateral;  Bilateral implantable pulse generator placement  . Cholecystectomy N/A 10/22/2014    Procedure: LAPAROSCOPIC CHOLECYSTECTOMY WITH INTRAOPERATIVE CHOLANGIOGRAM;  Surgeon: Dia Crawford III, MD;  Location: [facility] ORS;  Service: General;  Laterality: N/A;    There were no vitals filed for this visit.  Visit Diagnosis:  Muscle weakness (generalized)  Difficulty walking  Falls frequently  Lack of coordination      Subjective Assessment - 01/21/15 0903    Subjective Patient is having difficulty with balance.and back and right leg pain.   Currently in Pain? Yes   Pain Score 3    Pain Location Leg   Pain Orientation Right   Pain Descriptors / Indicators Aching   Pain Type Chronic pain   Pain Onset More than a month ago        Floor to ceiling x 10 reps, side to side 10 reps, step and reach forward x 10 reps, step and reach backwards x 10, step and reach sideways x 10 , Rock and reach forward/backward x 10 , Rock and reach sideways x  10, % functional tasks: 1. Sit to stand 2. Step ups on stool x 10 for balance challenges.3. Step ups from blue foam . 4. Tapping form blue foam. 5. Diagonal tapping left and right. Patient continues to demonstrates less incoordination of movement with select exercises such as rock and reach and stepping backwards. Patient responds well to verbal and tactile cues to correct form and technique. Patient is able to catch mistakes in technique with incorrect positions and is able remember the start and finish positions. Motor control of LE much improved. Muscle fatigue but no major pain complaints CGA and Min to mod verbal cues used throughout with increased in postural sway and LOB most seen with narrow base of  support and while on uneven surfaces. Continues to have balance deficits typical with diagnosis. Patient performs intermediate level exercises without pain behaviors and needs verbal cuing for postural alignment and head positioning Tactile cues and assistance needed to keep lower leg and knee in neutral to avoid compensations with ankle motions.                          PT Education - 01/21/15 0904    Education provided Yes   Education Details LSVT    Person(s) Educated Patient   Methods Explanation   Comprehension Verbalized understanding             PT Long Term Goals - 01/07/15 0925    PT LONG TERM GOAL #1   Title Patient will increase 10 meter walk test to >1.33.45m/s as to improve gait speed for better community ambulation and to reduce fall risk.  02/16/15   PT LONG TERM GOAL #2   Title Patient (> 30 years old) will complete five times sit to stand test in < 14  seconds indicating an increased LE strength and improved balance  02/16/15   PT LONG TERM GOAL #3   Title Patient will increase Berg Balance score by > 6 points to demonstrate decreased fall risk during functional activities  02/16/15   PT LONG TERM GOAL #4   Title Patient will reduce timed up and go to <11 seconds to reduce fall risk and demonstrate improved transfer/gait ability.  01/17/15               Plan - 01/21/15 0911    Clinical Impression Statement  Decreased coordination demonstrated requiring consistent verbal cueing to correct form   Pt will benefit from skilled therapeutic intervention in order to improve on the following deficits Abnormal gait;Decreased endurance;Decreased activity tolerance;Pain;Difficulty walking;Decreased mobility;Decreased balance;Decreased range of motion;Decreased coordination   Rehab Potential Good   PT Frequency 4x / week   PT Duration 4 weeks   PT Treatment/Interventions Balance training;Therapeutic exercise;Therapeutic activities;Stair training;Gait  training;Functional mobility training;Manual techniques   PT Next Visit Plan LSVT   Consulted and Agree with Plan of Care Patient        Problem List Patient Active Problem List   Diagnosis Date Noted  . Depression 01/06/2015  . Porcelain gallbladder 10/21/2014  . Skin lesion 10/02/2014  . Irritation of eyelid 08/20/2014  . Dupuytren's contracture 08/20/2014  . Cough 07/21/2014  . SOM (serous otitis media) 07/21/2014  . Lumbar stenosis with neurogenic claudication 06/13/2014  . Preop cardiovascular exam 05/28/2014  . S/P deep brain stimulator placement 05/08/2014  . PD (Parkinson's disease) ([place]) 05/08/2014  . Joint pain 01/22/2014  . Medicare annual wellness visit, initial 01/19/2014  . Advance care planning 01/19/2014  .  Parkinson's disease ([place]) 12/13/2013  . PTSD (post-traumatic stress disorder) 06/13/2013  . Erectile dysfunction 06/13/2013  . HLD (hyperlipidemia) 06/13/2013  . Hip pain 06/10/2013  . Pain in joint, shoulder region 06/10/2013  . Right leg swelling 06/03/2013  . Essential hypertension 06/03/2013  . Bradycardia by electrocardiogram 06/03/2013  . Obstructive sleep apnea 03/12/2013  . Spinal stenosis, lumbar region, with neurogenic claudication 12/14/2012    Alanson Puls 01/21/2015, 9:25 AM  [place], [place], [address] Phone: [phone]   Fax:  [phone]

## 2015-01-22 ENCOUNTER — Other Ambulatory Visit: Payer: Self-pay | Admitting: Family Medicine

## 2015-01-22 ENCOUNTER — Ambulatory Visit: Payer: Medicare PPO | Admitting: Physical Therapy

## 2015-01-22 ENCOUNTER — Encounter: Payer: Self-pay | Admitting: Physical Therapy

## 2015-01-22 DIAGNOSIS — R262 Difficulty in walking, not elsewhere classified: Secondary | ICD-10-CM

## 2015-01-22 DIAGNOSIS — R460 Very low level of personal hygiene: Secondary | ICD-10-CM

## 2015-01-22 DIAGNOSIS — Z741 Need for assistance with personal care: Secondary | ICD-10-CM

## 2015-01-22 DIAGNOSIS — M6281 Muscle weakness (generalized): Secondary | ICD-10-CM

## 2015-01-22 DIAGNOSIS — R296 Repeated falls: Secondary | ICD-10-CM

## 2015-01-22 DIAGNOSIS — R279 Unspecified lack of coordination: Secondary | ICD-10-CM

## 2015-01-22 MED ORDER — BUPROPION HCL ER (XL) 150 MG PO TB24: 450.0000 mg | ORAL_TABLET | Freq: Every day | ORAL | Status: DC

## 2015-01-22 NOTE — Therapy (Signed)
[place] MAIN [facility] SERVICES [address] [place], [place], [address] Phone: [phone]   Fax:  [phone]  Physical Therapy Treatment  Patient Details  Name: [name] MRN: [id number] Date of Birth: [birth year] Referring Provider:  Ludwig Clarks, DO  Encounter Date: 01/22/2015      PT End of Session - 01/22/15 1509    Visit Number 9   Number of Visits 17   Date for PT Re-Evaluation 02/16/15   Authorization Type 9   PT Start Time 0900   PT Stop Time 0955   PT Time Calculation (min) 55 min   Equipment Utilized During Treatment Gait belt   Activity Tolerance Patient tolerated treatment well;No increased pain   Behavior During Therapy [facility] for tasks assessed/performed      Past Medical History  Diagnosis Date  . Parkinson's disease ([place])     dx'ed 65 years ago  . PTSD (post-traumatic stress disorder)   . Bradycardia   . Hypertension     treated with HCTZ  . Sleep apnea     doesn't use C-pap  . Varicose veins   . Cancer [facility]) 2013    skin cancer  . Arthritis   . History of chicken pox   . GERD (gastroesophageal reflux disease)   . Dysrhythmia     chronic slow heart rate  . Depression     ptsd  . History of kidney stones   . Headache(784.0)     tension headaches non recent  . Shortness of breath dyspnea     Past Surgical History  Procedure Laterality Date  . Cyst removed       from lip as a child  . Skin cancer removed      from ears,   12 lft arm  rt leg 15  . Lumbar laminectomy/decompression microdiscectomy Bilateral 12/14/2012    Procedure: Bilateral lumbar three-four, four-five decompressive laminotomy/foraminotomy;  Surgeon: Charlie Pitter, MD;  Location: [place] NEURO ORS;  Service: Neurosurgery;  Laterality: Bilateral;  . Subthalamic stimulator insertion Bilateral 12/06/2013    Procedure: SUBTHALAMIC STIMULATOR INSERTION;  Surgeon: Erline Levine, MD;  Location: [place] NEURO ORS;  Service: Neurosurgery;  Laterality:  Bilateral;  Bilateral deep brain stimulator placement  . Pulse generator implant Bilateral 12/13/2013    Procedure: Bilateral implantable pulse generator placement;  Surgeon: Erline Levine, MD;  Location: [place] NEURO ORS;  Service: Neurosurgery;  Laterality: Bilateral;  Bilateral implantable pulse generator placement  . Cholecystectomy N/A 10/22/2014    Procedure: LAPAROSCOPIC CHOLECYSTECTOMY WITH INTRAOPERATIVE CHOLANGIOGRAM;  Surgeon: Dia Crawford III, MD;  Location: [facility] ORS;  Service: General;  Laterality: N/A;    There were no vitals filed for this visit.  Visit Diagnosis:  Muscle weakness (generalized)  Difficulty walking  Falls frequently  Lack of coordination  Self-care deficit for dressing and grooming      Subjective Assessment - 01/22/15 1508    Subjective Patient is having difficulty with balance.and back and right leg pain.   Currently in Pain? Yes   Pain Score 6    Pain Orientation Right   Pain Onset More than a month ago         Floor to ceiling x 10 reps, side to side 10 reps, step and reach forward x 10 reps, step and reach backwards x 10, step and reach sideways x 10 , Rock and reach forward/backward x 10 , Rock and reach sideways x 10, % functional tasks: 1. Sit to stand 2. Step  ups on stool x 10 for balance challenges.3. Step ups from blue foam . 4. Tapping form blue foam. 5. Diagonal tapping left and right.   Patient continues to demonstrates less incoordination of movement with select exercises such as rock and reach and stepping backwards. Patient responds well to verbal and tactile cues to correct form and technique. Patient is able to catch mistakes in technique with incorrect positions and is able remember the start and finish positions. Motor control of LE much improved. Muscle fatigue but no major pain complaints. CGA and Min to mod verbal cues used throughout with increased in postural sway and LOB most seen with narrow base of support and while on uneven  surfaces. Continues to have balance deficits typical with diagnosis. Patient performs intermediate level exercises without pain behaviors and needs verbal cuing for postural alignment and head positioning Tactile cues and assistance needed to keep lower leg and knee in neutral to avoid compensations with ankle motions                          PT Education - 01/22/15 1509    Education provided Yes   Education Details LSVT BIG   Person(s) Educated Patient   Methods Explanation   Comprehension Verbalized understanding             PT Long Term Goals - 01/07/15 0925    PT LONG TERM GOAL #1   Title Patient will increase 10 meter walk test to >1.33.73m/s as to improve gait speed for better community ambulation and to reduce fall risk.  02/16/15   PT LONG TERM GOAL #2   Title Patient (> 65 years old) will complete five times sit to stand test in < 14  seconds indicating an increased LE strength and improved balance  02/16/15   PT LONG TERM GOAL #3   Title Patient will increase Berg Balance score by > 6 points to demonstrate decreased fall risk during functional activities  02/16/15   PT LONG TERM GOAL #4   Title Patient will reduce timed up and go to <11 seconds to reduce fall risk and demonstrate improved transfer/gait ability.  01/17/15               Plan - 01/22/15 1509    Clinical Impression Statement Mod cueing needed to appropriately perform LSVT tasks with leg, hand, and head position.   Pt will benefit from skilled therapeutic intervention in order to improve on the following deficits Abnormal gait;Decreased endurance;Decreased activity tolerance;Pain;Difficulty walking;Decreased mobility;Decreased balance;Decreased range of motion;Decreased coordination   Rehab Potential Good   PT Frequency 4x / week   PT Duration 4 weeks   PT Treatment/Interventions Balance training;Therapeutic exercise;Therapeutic activities;Stair training;Gait training;Functional  mobility training;Manual techniques   PT Next Visit Plan LSVT   Consulted and Agree with Plan of Care Patient        Problem List Patient Active Problem List   Diagnosis Date Noted  . Depression 01/06/2015  . Porcelain gallbladder 10/21/2014  . Skin lesion 10/02/2014  . Irritation of eyelid 08/20/2014  . Dupuytren's contracture 08/20/2014  . Cough 07/21/2014  . SOM (serous otitis media) 07/21/2014  . Lumbar stenosis with neurogenic claudication 06/13/2014  . Preop cardiovascular exam 05/28/2014  . S/P deep brain stimulator placement 05/08/2014  . PD (Parkinson's disease) ([place]) 05/08/2014  . Joint pain 01/22/2014  . Medicare annual wellness visit, initial 01/19/2014  . Advance care planning 01/19/2014  . Parkinson's disease ([place]) 12/13/2013  .  PTSD (post-traumatic stress disorder) 06/13/2013  . Erectile dysfunction 06/13/2013  . HLD (hyperlipidemia) 06/13/2013  . Hip pain 06/10/2013  . Pain in joint, shoulder region 06/10/2013  . Right leg swelling 06/03/2013  . Essential hypertension 06/03/2013  . Bradycardia by electrocardiogram 06/03/2013  . Obstructive sleep apnea 03/12/2013  . Spinal stenosis, lumbar region, with neurogenic claudication 12/14/2012    Alanson Puls 01/22/2015, 3:11 PM  [place] MAIN [facility] SERVICES [address] [place], [place], [address] Phone: [phone]   Fax:  [phone]

## 2015-01-26 ENCOUNTER — Ambulatory Visit: Payer: Medicare PPO | Admitting: Physical Therapy

## 2015-01-26 ENCOUNTER — Telehealth: Payer: Self-pay | Admitting: Family Medicine

## 2015-01-26 ENCOUNTER — Ambulatory Visit: Payer: Self-pay | Admitting: Physical Therapy

## 2015-01-26 ENCOUNTER — Encounter: Payer: Self-pay | Admitting: Physical Therapy

## 2015-01-26 DIAGNOSIS — R262 Difficulty in walking, not elsewhere classified: Secondary | ICD-10-CM

## 2015-01-26 DIAGNOSIS — R296 Repeated falls: Secondary | ICD-10-CM

## 2015-01-26 DIAGNOSIS — M6281 Muscle weakness (generalized): Secondary | ICD-10-CM | POA: Diagnosis not present

## 2015-01-26 DIAGNOSIS — R279 Unspecified lack of coordination: Secondary | ICD-10-CM

## 2015-01-26 DIAGNOSIS — G4730 Sleep apnea, unspecified: Secondary | ICD-10-CM

## 2015-01-26 NOTE — Therapy (Signed)
[place] MAIN [facility] SERVICES [address] [place], [place], [address] Phone: [phone]   Fax:  [phone]  Physical Therapy Treatment  Patient Details  Name: [name] MRN: [id number] Date of Birth: 1950/09/25 Referring Provider:  Tonia Ghent, MD  Encounter Date: 01/26/2015      PT End of Session - 01/26/15 0912    Visit Number 10   Number of Visits 17   Date for PT Re-Evaluation 02/16/15   Authorization Type 10   PT Start Time 0900   PT Stop Time 1000   PT Time Calculation (min) 60 min   Equipment Utilized During Treatment Gait belt   Activity Tolerance Patient tolerated treatment well;No increased pain   Behavior During Therapy [facility] for tasks assessed/performed      Past Medical History  Diagnosis Date  . Parkinson's disease ([place])     dx'ed 15 years ago  . PTSD (post-traumatic stress disorder)   . Bradycardia   . Hypertension     treated with HCTZ  . Sleep apnea     doesn't use C-pap  . Varicose veins   . Cancer [facility]) 2013    skin cancer  . Arthritis   . History of chicken pox   . GERD (gastroesophageal reflux disease)   . Dysrhythmia     chronic slow heart rate  . Depression     ptsd  . History of kidney stones   . Headache(784.0)     tension headaches non recent  . Shortness of breath dyspnea     Past Surgical History  Procedure Laterality Date  . Cyst removed       from lip as a child  . Skin cancer removed      from ears,   12 lft arm  rt leg 15  . Lumbar laminectomy/decompression microdiscectomy Bilateral 12/14/2012    Procedure: Bilateral lumbar three-four, four-five decompressive laminotomy/foraminotomy;  Surgeon: Charlie Pitter, MD;  Location: [place] NEURO ORS;  Service: Neurosurgery;  Laterality: Bilateral;  . Subthalamic stimulator insertion Bilateral 12/06/2013    Procedure: SUBTHALAMIC STIMULATOR INSERTION;  Surgeon: Erline Levine, MD;  Location: [place] NEURO ORS;  Service: Neurosurgery;  Laterality:  Bilateral;  Bilateral deep brain stimulator placement  . Pulse generator implant Bilateral 12/13/2013    Procedure: Bilateral implantable pulse generator placement;  Surgeon: Erline Levine, MD;  Location: [place] NEURO ORS;  Service: Neurosurgery;  Laterality: Bilateral;  Bilateral implantable pulse generator placement  . Cholecystectomy N/A 10/22/2014    Procedure: LAPAROSCOPIC CHOLECYSTECTOMY WITH INTRAOPERATIVE CHOLANGIOGRAM;  Surgeon: Dia Crawford III, MD;  Location: [facility] ORS;  Service: General;  Laterality: N/A;    There were no vitals filed for this visit.  Visit Diagnosis:  Muscle weakness (generalized)  Difficulty walking  Falls frequently  Lack of coordination      Subjective Assessment - 01/26/15 0905    Subjective Patient is having difficulty with balance.and back and right leg pain.   Pain Score 0-No pain   Pain Onset More than a month ago      Floor to ceiling x 10 reps, side to side 10 reps, step and reach forward x 10 reps, step and reach backwards x 10, step and reach sideways x 10 , Rock and reach forward/backward x 10 , Rock and reach sideways x 10, % functional tasks: 1. Sit to stand 2. Step ups on stool x 10 for balance challenges.3. Step ups from blue foam . 4. Tapping form blue foam. 5. Diagonal  tapping left and right. Patient continues to demonstrates less incoordination of movement with select exercises such as rock and reach and stepping backwards. Patient responds well to verbal and tactile cues to correct form and technique. Patient is able to catch mistakes in technique with incorrect positions and is able remember the start and finish positions. Motor control of LE much improved. Muscle fatigue but no major pain complaints. Outcome measures: TUG 9.25 sec 10 MW1.51 m/sec 6 MW 1400 feet 5 x sit to stand13.30 sec Peg test left   27.32 sec Peg test right  28.73 sec                           PT Education - 01/26/15 0912    Education provided  Yes   Education Details LSVT BIG   Person(s) Educated Patient   Methods Explanation   Comprehension Verbalized understanding             PT Long Term Goals - 01/07/15 0925    PT LONG TERM GOAL #1   Title Patient will increase 10 meter walk test to >1.33.69m/s as to improve gait speed for better community ambulation and to reduce fall risk.  02/16/15   PT LONG TERM GOAL #2   Title Patient (> 63 years old) will complete five times sit to stand test in < 14  seconds indicating an increased LE strength and improved balance  02/16/15   PT LONG TERM GOAL #3   Title Patient will increase Berg Balance score by > 6 points to demonstrate decreased fall risk during functional activities  02/16/15   PT LONG TERM GOAL #4   Title Patient will reduce timed up and go to <11 seconds to reduce fall risk and demonstrate improved transfer/gait ability.  01/17/15               Plan - 01/26/15 0913    Clinical Impression Statement Patient is progressing with outcome measures with imporvement with TUG, 10 MW, 6 MW , and 5 x sit to stand.    Pt will benefit from skilled therapeutic intervention in order to improve on the following deficits Abnormal gait;Decreased endurance;Decreased activity tolerance;Pain;Difficulty walking;Decreased mobility;Decreased balance;Decreased range of motion;Decreased coordination   Rehab Potential Good   PT Frequency 4x / week   PT Duration 4 weeks   PT Treatment/Interventions Balance training;Therapeutic exercise;Therapeutic activities;Stair training;Gait training;Functional mobility training;Manual techniques   PT Next Visit Plan LSVT   Consulted and Agree with Plan of Care Patient        Problem List Patient Active Problem List   Diagnosis Date Noted  . Depression 01/06/2015  . Porcelain gallbladder 10/21/2014  . Skin lesion 10/02/2014  . Irritation of eyelid 08/20/2014  . Dupuytren's contracture 08/20/2014  . Cough 07/21/2014  . SOM (serous otitis media)  07/21/2014  . Lumbar stenosis with neurogenic claudication 06/13/2014  . Preop cardiovascular exam 05/28/2014  . S/P deep brain stimulator placement 05/08/2014  . PD (Parkinson's disease) ([place]) 05/08/2014  . Joint pain 01/22/2014  . Medicare annual wellness visit, initial 01/19/2014  . Advance care planning 01/19/2014  . Parkinson's disease ([place]) 12/13/2013  . PTSD (post-traumatic stress disorder) 06/13/2013  . Erectile dysfunction 06/13/2013  . HLD (hyperlipidemia) 06/13/2013  . Hip pain 06/10/2013  . Pain in joint, shoulder region 06/10/2013  . Right leg swelling 06/03/2013  . Essential hypertension 06/03/2013  . Bradycardia by electrocardiogram 06/03/2013  . Obstructive sleep apnea 03/12/2013  . Spinal  stenosis, lumbar region, with neurogenic claudication 12/14/2012    Alanson Puls 01/26/2015, 9:19 AM  [place] MAIN [facility] SERVICES [place], [place], [address] Phone: [phone]   Fax:  [phone]

## 2015-01-26 NOTE — Telephone Encounter (Signed)
Pt wife request sleep study referral. Would like [facility] in [place]. Please advise

## 2015-01-27 ENCOUNTER — Ambulatory Visit: Payer: Medicare PPO | Admitting: Physical Therapy

## 2015-01-27 ENCOUNTER — Encounter: Payer: Self-pay | Admitting: Physical Therapy

## 2015-01-27 DIAGNOSIS — R296 Repeated falls: Secondary | ICD-10-CM

## 2015-01-27 DIAGNOSIS — M6281 Muscle weakness (generalized): Secondary | ICD-10-CM

## 2015-01-27 DIAGNOSIS — R262 Difficulty in walking, not elsewhere classified: Secondary | ICD-10-CM

## 2015-01-27 DIAGNOSIS — R279 Unspecified lack of coordination: Secondary | ICD-10-CM

## 2015-01-27 NOTE — Telephone Encounter (Signed)
Wife notified of referral.  Please once scheduled.

## 2015-01-27 NOTE — Telephone Encounter (Signed)
Left message for wife to return call 

## 2015-01-27 NOTE — Telephone Encounter (Signed)
Ordered- refer to pulmonary. Thanks.

## 2015-01-27 NOTE — Therapy (Signed)
[place] MAIN [facility] SERVICES [address] [place], [place], [address] Phone: [phone]   Fax:  [phone]  Physical Therapy Treatment  Patient Details  Name: [name] MRN: [id number] Date of Birth: April 10, 1951 Referring Provider:  Ludwig Clarks, DO  Encounter Date: 01/27/2015      PT End of Session - 01/27/15 0922    Visit Number 11   Number of Visits 17   Date for PT Re-Evaluation 02/16/15   Authorization Type 11   Equipment Utilized During Treatment Gait belt   Activity Tolerance Patient tolerated treatment well;No increased pain   Behavior During Therapy [facility] for tasks assessed/performed      Past Medical History  Diagnosis Date  . Parkinson's disease ([place])     dx'ed 15 years ago  . PTSD (post-traumatic stress disorder)   . Bradycardia   . Hypertension     treated with HCTZ  . Sleep apnea     doesn't use C-pap  . Varicose veins   . Cancer [facility]) 2013    skin cancer  . Arthritis   . History of chicken pox   . GERD (gastroesophageal reflux disease)   . Dysrhythmia     chronic slow heart rate  . Depression     ptsd  . History of kidney stones   . Headache(784.0)     tension headaches non recent  . Shortness of breath dyspnea     Past Surgical History  Procedure Laterality Date  . Cyst removed       from lip as a child  . Skin cancer removed      from ears,   12 lft arm  rt leg 15  . Lumbar laminectomy/decompression microdiscectomy Bilateral 12/14/2012    Procedure: Bilateral lumbar three-four, four-five decompressive laminotomy/foraminotomy;  Surgeon: Charlie Pitter, MD;  Location: [place] NEURO ORS;  Service: Neurosurgery;  Laterality: Bilateral;  . Subthalamic stimulator insertion Bilateral 12/06/2013    Procedure: SUBTHALAMIC STIMULATOR INSERTION;  Surgeon: Erline Levine, MD;  Location: [place] NEURO ORS;  Service: Neurosurgery;  Laterality: Bilateral;  Bilateral deep brain stimulator placement  . Pulse generator  implant Bilateral 12/13/2013    Procedure: Bilateral implantable pulse generator placement;  Surgeon: Erline Levine, MD;  Location: [place] NEURO ORS;  Service: Neurosurgery;  Laterality: Bilateral;  Bilateral implantable pulse generator placement  . Cholecystectomy N/A 10/22/2014    Procedure: LAPAROSCOPIC CHOLECYSTECTOMY WITH INTRAOPERATIVE CHOLANGIOGRAM;  Surgeon: Dia Crawford III, MD;  Location: [facility] ORS;  Service: General;  Laterality: N/A;    There were no vitals filed for this visit.  Visit Diagnosis:  Muscle weakness (generalized)  Difficulty walking  Falls frequently  Lack of coordination      Subjective Assessment - 01/27/15 0920    Subjective Patient is having Right leg pain and unsteadness with ambulation.   Pain Score 3    Pain Location Leg   Pain Orientation Right   Pain Onset More than a month ago         Floor to ceiling x 10 reps, side to side 10 reps, step and reach forward x 10 reps, step and reach backwards x 10, step and reach sideways x 10 , Rock and reach forward/backward x 10 , Rock and reach sideways x 10, % functional tasks: 1. Sit to stand 2. Step ups on stool x 10 for balance challenges.3. Step ups from blue foam . 4. Tapping form blue foam. 5. Diagonal tapping left and right. Patient continues to demonstrates  less incoordination of movement with select exercises such as stepping backwards. And needs cues to bend his back knee, supinate his arms, twist and not move his ffeet. Patient responds well to verbal cues to correct form and technique. Patient is able to catch mistakes in technique with incorrect positions and is able remember the start and finish positions. Motor control of LE much improved.                          PT Education - 01/27/15 0921    Education provided Yes   Education Details LSVTBIG   Person(s) Educated Patient   Methods Explanation   Comprehension Verbalized understanding             PT Long Term Goals -  01/07/15 0925    PT LONG TERM GOAL #1   Title Patient will increase 10 meter walk test to >1.33.46m/s as to improve gait speed for better community ambulation and to reduce fall risk.  02/16/15   PT LONG TERM GOAL #2   Title Patient (> 56 years old) will complete five times sit to stand test in < 14  seconds indicating an increased LE strength and improved balance  02/16/15   PT LONG TERM GOAL #3   Title Patient will increase Berg Balance score by > 6 points to demonstrate decreased fall risk during functional activities  02/16/15   PT LONG TERM GOAL #4   Title Patient will reduce timed up and go to <11 seconds to reduce fall risk and demonstrate improved transfer/gait ability.  01/17/15               Plan - 01/27/15 1610    Clinical Impression Statement Patient needs correction for hand placement and for direction of foot and leg placement, supination of UE's and head turns but has starta nd end positions correct.    Pt will benefit from skilled therapeutic intervention in order to improve on the following deficits Abnormal gait;Decreased endurance;Decreased activity tolerance;Pain;Difficulty walking;Decreased mobility;Decreased balance;Decreased range of motion;Decreased coordination   Rehab Potential Good   PT Frequency 4x / week   PT Duration 4 weeks   PT Treatment/Interventions Balance training;Therapeutic exercise;Therapeutic activities;Stair training;Gait training;Functional mobility training;Manual techniques   PT Next Visit Plan LSVT   Consulted and Agree with Plan of Care Patient        Problem List Patient Active Problem List   Diagnosis Date Noted  . Depression 01/06/2015  . Porcelain gallbladder 10/21/2014  . Skin lesion 10/02/2014  . Irritation of eyelid 08/20/2014  . Dupuytren's contracture 08/20/2014  . Cough 07/21/2014  . SOM (serous otitis media) 07/21/2014  . Lumbar stenosis with neurogenic claudication 06/13/2014  . Preop cardiovascular exam 05/28/2014   . S/P deep brain stimulator placement 05/08/2014  . PD (Parkinson's disease) ([place]) 05/08/2014  . Joint pain 01/22/2014  . Medicare annual wellness visit, initial 01/19/2014  . Advance care planning 01/19/2014  . Parkinson's disease ([place]) 12/13/2013  . PTSD (post-traumatic stress disorder) 06/13/2013  . Erectile dysfunction 06/13/2013  . HLD (hyperlipidemia) 06/13/2013  . Hip pain 06/10/2013  . Pain in joint, shoulder region 06/10/2013  . Right leg swelling 06/03/2013  . Essential hypertension 06/03/2013  . Bradycardia by electrocardiogram 06/03/2013  . Obstructive sleep apnea 03/12/2013  . Spinal stenosis, lumbar region, with neurogenic claudication 12/14/2012    Alanson Puls 01/27/2015, 9:27 AM  [place] MAIN REHAB SERVICES [place],  [place], [address] Phone: [phone]   Fax:  [phone]

## 2015-01-28 ENCOUNTER — Encounter: Payer: Self-pay | Admitting: Physical Therapy

## 2015-01-28 ENCOUNTER — Ambulatory Visit: Payer: Medicare PPO | Admitting: Physical Therapy

## 2015-01-28 DIAGNOSIS — M6281 Muscle weakness (generalized): Secondary | ICD-10-CM

## 2015-01-28 DIAGNOSIS — R296 Repeated falls: Secondary | ICD-10-CM

## 2015-01-28 DIAGNOSIS — R279 Unspecified lack of coordination: Secondary | ICD-10-CM

## 2015-01-28 DIAGNOSIS — R262 Difficulty in walking, not elsewhere classified: Secondary | ICD-10-CM

## 2015-01-28 NOTE — Telephone Encounter (Signed)
Left message for wife to return call. I need to give her appt info for pt to see Pulmonology

## 2015-01-28 NOTE — Therapy (Signed)
[place] MAIN [facility] SERVICES [address] [place], [place], [address] Phone: [phone]   Fax:  [phone]  Physical Therapy Treatment  Patient Details  Name: [name] MRN: [id number] Date of Birth: 1950-07-04 Referring Provider:  Ludwig Clarks, DO  Encounter Date: 01/28/2015      PT End of Session - 01/28/15 0911    Visit Number 12   Number of Visits 17   Date for PT Re-Evaluation 02/16/15   Authorization Type 11   PT Start Time 0900   PT Stop Time 1000   PT Time Calculation (min) 60 min   Equipment Utilized During Treatment Gait belt   Activity Tolerance Patient tolerated treatment well;No increased pain   Behavior During Therapy [facility] for tasks assessed/performed      Past Medical History  Diagnosis Date  . Parkinson's disease ([place])     dx'ed 15 years ago  . PTSD (post-traumatic stress disorder)   . Bradycardia   . Hypertension     treated with HCTZ  . Sleep apnea     doesn't use C-pap  . Varicose veins   . Cancer [facility]) 2013    skin cancer  . Arthritis   . History of chicken pox   . GERD (gastroesophageal reflux disease)   . Dysrhythmia     chronic slow heart rate  . Depression     ptsd  . History of kidney stones   . Headache(784.0)     tension headaches non recent  . Shortness of breath dyspnea     Past Surgical History  Procedure Laterality Date  . Cyst removed       from lip as a child  . Skin cancer removed      from ears,   12 lft arm  rt leg 15  . Lumbar laminectomy/decompression microdiscectomy Bilateral 12/14/2012    Procedure: Bilateral lumbar three-four, four-five decompressive laminotomy/foraminotomy;  Surgeon: Charlie Pitter, MD;  Location: [place] NEURO ORS;  Service: Neurosurgery;  Laterality: Bilateral;  . Subthalamic stimulator insertion Bilateral 12/06/2013    Procedure: SUBTHALAMIC STIMULATOR INSERTION;  Surgeon: Erline Levine, MD;  Location: [place] NEURO ORS;  Service: Neurosurgery;  Laterality:  Bilateral;  Bilateral deep brain stimulator placement  . Pulse generator implant Bilateral 12/13/2013    Procedure: Bilateral implantable pulse generator placement;  Surgeon: Erline Levine, MD;  Location: [place] NEURO ORS;  Service: Neurosurgery;  Laterality: Bilateral;  Bilateral implantable pulse generator placement  . Cholecystectomy N/A 10/22/2014    Procedure: LAPAROSCOPIC CHOLECYSTECTOMY WITH INTRAOPERATIVE CHOLANGIOGRAM;  Surgeon: Dia Crawford III, MD;  Location: [facility] ORS;  Service: General;  Laterality: N/A;    There were no vitals filed for this visit.  Visit Diagnosis:  Muscle weakness (generalized)  Difficulty walking  Falls frequently  Lack of coordination      Subjective Assessment - 01/28/15 0910    Subjective Patient is having Right leg pain and unsteadness with ambulation.   Pain Score 4    Pain Location Leg   Pain Orientation Right   Pain Onset More than a month ago      Floor to ceiling x 10 reps, side to side 10 reps, step and reach forward x 10 reps, step and reach backwards x 10, step and reach sideways x 10 , Rock and reach forward/backward x 10 , Rock and reach sideways x 10, % functional tasks: 1. Sit to stand 2. Step ups on stool x 10 for balance challenges.3. Step ups from blue  foam . 4. Tapping form blue foam. 5. Diagonal tapping left and right.   Patient continues to demonstrates less incoordination of movement with select exercises such as rock and reach and stepping backwards. Patient responds well to verbal and tactile cues to correct form and technique. Patient is able to catch mistakes in technique with incorrect positions and is able remember the start and finish positions. Motor control of LE much improved. Muscle fatigue but no major pain complaints. CGA and Min to mod verbal cues used throughout with increased in postural sway and LOB most seen with narrow base of support and while on uneven surfaces. Continues to have balance deficits typical with diagnosis.  Patient performs intermediate level exercises without pain behaviors and needs verbal cuing for postural alignment and head positioning                            PT Education - 01/28/15 0911    Education provided Yes   Education Details LSVT BIG   Person(s) Educated Patient   Methods Explanation   Comprehension Verbalized understanding             PT Long Term Goals - 01/07/15 0925    PT LONG TERM GOAL #1   Title Patient will increase 10 meter walk test to >1.33.7m/s as to improve gait speed for better community ambulation and to reduce fall risk.  02/16/15   PT LONG TERM GOAL #2   Title Patient (> 49 years old) will complete five times sit to stand test in < 14  seconds indicating an increased LE strength and improved balance  02/16/15   PT LONG TERM GOAL #3   Title Patient will increase Berg Balance score by > 6 points to demonstrate decreased fall risk during functional activities  02/16/15   PT LONG TERM GOAL #4   Title Patient will reduce timed up and go to <11 seconds to reduce fall risk and demonstrate improved transfer/gait ability.  01/17/15               Plan - 01/28/15 0911    Clinical Impression Statement CGA to SBA for safety with activities.  Uses to increase intensity and amplitude of movements throughout session   Pt will benefit from skilled therapeutic intervention in order to improve on the following deficits Abnormal gait;Decreased endurance;Decreased activity tolerance;Pain;Difficulty walking;Decreased mobility;Decreased balance;Decreased range of motion;Decreased coordination   Rehab Potential Good   PT Frequency 4x / week   PT Duration 4 weeks   PT Treatment/Interventions Balance training;Therapeutic exercise;Therapeutic activities;Stair training;Gait training;Functional mobility training;Manual techniques   PT Next Visit Plan LSVT   Consulted and Agree with Plan of Care Patient        Problem List Patient Active Problem  List   Diagnosis Date Noted  . Depression 01/06/2015  . Porcelain gallbladder 10/21/2014  . Skin lesion 10/02/2014  . Irritation of eyelid 08/20/2014  . Dupuytren's contracture 08/20/2014  . Cough 07/21/2014  . SOM (serous otitis media) 07/21/2014  . Lumbar stenosis with neurogenic claudication 06/13/2014  . Preop cardiovascular exam 05/28/2014  . S/P deep brain stimulator placement 05/08/2014  . PD (Parkinson's disease) ([place]) 05/08/2014  . Joint pain 01/22/2014  . Medicare annual wellness visit, initial 01/19/2014  . Advance care planning 01/19/2014  . Parkinson's disease ([place]) 12/13/2013  . PTSD (post-traumatic stress disorder) 06/13/2013  . Erectile dysfunction 06/13/2013  . HLD (hyperlipidemia) 06/13/2013  . Hip pain 06/10/2013  . Pain  in joint, shoulder region 06/10/2013  . Right leg swelling 06/03/2013  . Essential hypertension 06/03/2013  . Bradycardia by electrocardiogram 06/03/2013  . Obstructive sleep apnea 03/12/2013  . Spinal stenosis, lumbar region, with neurogenic claudication 12/14/2012    Alanson Puls 01/28/2015, 9:15 AM  [place] MAIN [facility] SERVICES [place], [place], [address] Phone: [phone]   Fax:  [phone]

## 2015-01-28 NOTE — Telephone Encounter (Signed)
Pt wife called back and appt info given

## 2015-01-29 ENCOUNTER — Ambulatory Visit: Payer: Medicare PPO | Admitting: Physical Therapy

## 2015-02-02 ENCOUNTER — Ambulatory Visit: Payer: Medicare PPO | Admitting: Physical Therapy

## 2015-02-02 ENCOUNTER — Encounter: Payer: Self-pay | Admitting: Physical Therapy

## 2015-02-02 DIAGNOSIS — M6281 Muscle weakness (generalized): Secondary | ICD-10-CM

## 2015-02-02 DIAGNOSIS — R279 Unspecified lack of coordination: Secondary | ICD-10-CM

## 2015-02-02 DIAGNOSIS — R262 Difficulty in walking, not elsewhere classified: Secondary | ICD-10-CM

## 2015-02-02 DIAGNOSIS — R296 Repeated falls: Secondary | ICD-10-CM

## 2015-02-02 NOTE — Therapy (Signed)
[place] MAIN [facility] SERVICES [address] [place], [place], [address] Phone: [phone]   Fax:  [phone]  Physical Therapy Treatment  Patient Details  Name: [name] MRN: [id number] Date of Birth: 05-30-1950 No Data Recorded  Encounter Date: 02/02/2015      PT End of Session - 02/02/15 0846    Visit Number 13   Number of Visits 17   Date for PT Re-Evaluation 02/16/15   Authorization Type 13   PT Start Time 0900   PT Stop Time 1000   PT Time Calculation (min) 60 min   Equipment Utilized During Treatment Gait belt   Activity Tolerance Patient tolerated treatment well;No increased pain   Behavior During Therapy [facility] for tasks assessed/performed      Past Medical History  Diagnosis Date  . Parkinson's disease ([place])     dx'ed 15 years ago  . PTSD (post-traumatic stress disorder)   . Bradycardia   . Hypertension     treated with HCTZ  . Sleep apnea     doesn't use C-pap  . Varicose veins   . Cancer [facility]) 2013    skin cancer  . Arthritis   . History of chicken pox   . GERD (gastroesophageal reflux disease)   . Dysrhythmia     chronic slow heart rate  . Depression     ptsd  . History of kidney stones   . Headache(784.0)     tension headaches non recent  . Shortness of breath dyspnea     Past Surgical History  Procedure Laterality Date  . Cyst removed       from lip as a child  . Skin cancer removed      from ears,   12 lft arm  rt leg 15  . Lumbar laminectomy/decompression microdiscectomy Bilateral 12/14/2012    Procedure: Bilateral lumbar three-four, four-five decompressive laminotomy/foraminotomy;  Surgeon: Charlie Pitter, MD;  Location: [place] NEURO ORS;  Service: Neurosurgery;  Laterality: Bilateral;  . Subthalamic stimulator insertion Bilateral 12/06/2013    Procedure: SUBTHALAMIC STIMULATOR INSERTION;  Surgeon: Erline Levine, MD;  Location: [place] NEURO ORS;  Service: Neurosurgery;  Laterality: Bilateral;  Bilateral  deep brain stimulator placement  . Pulse generator implant Bilateral 12/13/2013    Procedure: Bilateral implantable pulse generator placement;  Surgeon: Erline Levine, MD;  Location: [place] NEURO ORS;  Service: Neurosurgery;  Laterality: Bilateral;  Bilateral implantable pulse generator placement  . Cholecystectomy N/A 10/22/2014    Procedure: LAPAROSCOPIC CHOLECYSTECTOMY WITH INTRAOPERATIVE CHOLANGIOGRAM;  Surgeon: Dia Crawford III, MD;  Location: [facility] ORS;  Service: General;  Laterality: N/A;    There were no vitals filed for this visit.  Visit Diagnosis:  Muscle weakness (generalized)  Difficulty walking  Falls frequently  Lack of coordination      Subjective Assessment - 02/02/15 0854    Subjective Patient is having Right leg pain and unsteadness with ambulation. He is having unsteady gait but feels that he is getting better.    Pain Onset More than a month ago                                 PT Education - 02/02/15 0855    Education provided Yes   Education Details LSVT BIG   Person(s) Educated Patient   Methods Explanation   Comprehension Verbalized understanding             PT Long  Term Goals - 02/02/15 0924    PT LONG TERM GOAL #1   Status Partially Met   PT LONG TERM GOAL #2   Status Achieved   PT LONG TERM GOAL #3   Status Partially Met   PT LONG TERM GOAL #4   Status Achieved               Plan - 02/02/15 0856    Clinical Impression Statement Cuing is needed to stretch the leg out in seated side reaching   Pt will benefit from skilled therapeutic intervention in order to improve on the following deficits Abnormal gait;Decreased endurance;Decreased activity tolerance;Pain;Difficulty walking;Decreased mobility;Decreased balance;Decreased range of motion;Decreased coordination   Rehab Potential Good   PT Frequency 4x / week   PT Duration 4 weeks   PT Treatment/Interventions Balance training;Therapeutic exercise;Therapeutic  activities;Stair training;Gait training;Functional mobility training;Manual techniques   PT Next Visit Plan LSVT   Consulted and Agree with Plan of Care Patient        Problem List Patient Active Problem List   Diagnosis Date Noted  . Depression 01/06/2015  . Porcelain gallbladder 10/21/2014  . Skin lesion 10/02/2014  . Irritation of eyelid 08/20/2014  . Dupuytren's contracture 08/20/2014  . Cough 07/21/2014  . SOM (serous otitis media) 07/21/2014  . Lumbar stenosis with neurogenic claudication 06/13/2014  . Preop cardiovascular exam 05/28/2014  . S/P deep brain stimulator placement 05/08/2014  . PD (Parkinson's disease) ([place]) 05/08/2014  . Joint pain 01/22/2014  . Medicare annual wellness visit, initial 01/19/2014  . Advance care planning 01/19/2014  . Parkinson's disease ([place]) 12/13/2013  . PTSD (post-traumatic stress disorder) 06/13/2013  . Erectile dysfunction 06/13/2013  . HLD (hyperlipidemia) 06/13/2013  . Hip pain 06/10/2013  . Pain in joint, shoulder region 06/10/2013  . Right leg swelling 06/03/2013  . Essential hypertension 06/03/2013  . Bradycardia by electrocardiogram 06/03/2013  . Obstructive sleep apnea 03/12/2013  . Spinal stenosis, lumbar region, with neurogenic claudication 12/14/2012    Alanson Puls 02/02/2015, 9:24 AM  [place] MAIN [facility] SERVICES [place], [place], [address] Phone: [phone]   Fax:  [phone]  Name: [name] MRN: [id number] Date of Birth: December 31, 1950

## 2015-02-02 NOTE — Therapy (Signed)
[place] MAIN [facility] SERVICES [address] [place], [place], [address] Phone: [phone]   Fax:  [phone]  Physical Therapy Treatment/ discharge summary Patient Details  Name: [name] MRN: [id number] Date of Birth: 12/05/1950 No Data Recorded  Encounter Date: 02/02/2015      PT End of Session - 02/02/15 0846    Visit Number 13   Number of Visits 17   Date for PT Re-Evaluation 02/16/15   Authorization Type 13   PT Start Time 0900   PT Stop Time 1000   PT Time Calculation (min) 60 min   Equipment Utilized During Treatment Gait belt   Activity Tolerance Patient tolerated treatment well;No increased pain   Behavior During Therapy [facility] for tasks assessed/performed      Past Medical History  Diagnosis Date  . Parkinson's disease ([place])     dx'ed 15 years ago  . PTSD (post-traumatic stress disorder)   . Bradycardia   . Hypertension     treated with HCTZ  . Sleep apnea     doesn't use C-pap  . Varicose veins   . Cancer [facility] 2013    skin cancer  . Arthritis   . History of chicken pox   . GERD (gastroesophageal reflux disease)   . Dysrhythmia     chronic slow heart rate  . Depression     ptsd  . History of kidney stones   . Headache(784.0)     tension headaches non recent  . Shortness of breath dyspnea     Past Surgical History  Procedure Laterality Date  . Cyst removed       from lip as a child  . Skin cancer removed      from ears,   12 lft arm  rt leg 15  . Lumbar laminectomy/decompression microdiscectomy Bilateral 12/14/2012    Procedure: Bilateral lumbar three-four, four-five decompressive laminotomy/foraminotomy;  Surgeon: Charlie Pitter, MD;  Location: [place] NEURO ORS;  Service: Neurosurgery;  Laterality: Bilateral;  . Subthalamic stimulator insertion Bilateral 12/06/2013    Procedure: SUBTHALAMIC STIMULATOR INSERTION;  Surgeon: Erline Levine, MD;  Location: [place] NEURO ORS;  Service: Neurosurgery;  Laterality:  Bilateral;  Bilateral deep brain stimulator placement  . Pulse generator implant Bilateral 12/13/2013    Procedure: Bilateral implantable pulse generator placement;  Surgeon: Erline Levine, MD;  Location: [place] NEURO ORS;  Service: Neurosurgery;  Laterality: Bilateral;  Bilateral implantable pulse generator placement  . Cholecystectomy N/A 10/22/2014    Procedure: LAPAROSCOPIC CHOLECYSTECTOMY WITH INTRAOPERATIVE CHOLANGIOGRAM;  Surgeon: Dia Crawford III, MD;  Location: [facility] ORS;  Service: General;  Laterality: N/A;    There were no vitals filed for this visit.  Visit Diagnosis:  Muscle weakness (generalized)  Difficulty walking  Falls frequently  Lack of coordination      Subjective Assessment - 02/02/15 0854    Subjective Patient is having Right leg pain and unsteadness with ambulation. He is having unsteady gait but feels that he is getting better.    Pain Onset More than a month ago          Floor to ceiling x 10 reps, side to side 10 reps, step and reach forward x 10 reps, step and reach backwards x 10, step and reach sideways x 10 , Rock and reach forward/backward x 10 , Rock and reach sideways x 10, % functional tasks: 1. Sit to stand 2. Step ups on stool x 10 for balance challenges.3. Step ups from blue foam .  4. Tapping form blue foam. 5. Diagonal tapping left and right.  Decreased coordination demonstrated requiring consistent verbal cueing to correct form Patient is being discharged to a HEP.                         PT Education - 02/02/15 0855    Education provided Yes   Education Details LSVT BIG   Person(s) Educated Patient   Methods Explanation   Comprehension Verbalized understanding             PT Long Term Goals - 01/07/15 0925    PT LONG TERM GOAL #1   Title Patient will increase 10 meter walk test to >1.33.32m/s as to improve gait speed for better community ambulation and to reduce fall risk.  02/16/15   PT LONG TERM GOAL #2   Title  Patient (> 21 years old) will complete five times sit to stand test in < 14  seconds indicating an increased LE strength and improved balance  02/16/15   PT LONG TERM GOAL #3   Title Patient will increase Berg Balance score by > 6 points to demonstrate decreased fall risk during functional activities  02/16/15   PT LONG TERM GOAL #4   Title Patient will reduce timed up and go to <11 seconds to reduce fall risk and demonstrate improved transfer/gait ability.  01/17/15               Plan - 02/02/15 0856    Clinical Impression Statement Cuing is needed to stretch the leg out in seated side reaching   Pt will benefit from skilled therapeutic intervention in order to improve on the following deficits Abnormal gait;Decreased endurance;Decreased activity tolerance;Pain;Difficulty walking;Decreased mobility;Decreased balance;Decreased range of motion;Decreased coordination   Rehab Potential Good   PT Frequency 4x / week   PT Duration 4 weeks   PT Treatment/Interventions Balance training;Therapeutic exercise;Therapeutic activities;Stair training;Gait training;Functional mobility training;Manual techniques   PT Next Visit Plan LSVT   Consulted and Agree with Plan of Care Patient        Problem List Patient Active Problem List   Diagnosis Date Noted  . Depression 01/06/2015  . Porcelain gallbladder 10/21/2014  . Skin lesion 10/02/2014  . Irritation of eyelid 08/20/2014  . Dupuytren's contracture 08/20/2014  . Cough 07/21/2014  . SOM (serous otitis media) 07/21/2014  . Lumbar stenosis with neurogenic claudication 06/13/2014  . Preop cardiovascular exam 05/28/2014  . S/P deep brain stimulator placement 05/08/2014  . PD (Parkinson's disease) ([place]) 05/08/2014  . Joint pain 01/22/2014  . Medicare annual wellness visit, initial 01/19/2014  . Advance care planning 01/19/2014  . Parkinson's disease ([place]) 12/13/2013  . PTSD (post-traumatic stress disorder) 06/13/2013  . Erectile  dysfunction 06/13/2013  . HLD (hyperlipidemia) 06/13/2013  . Hip pain 06/10/2013  . Pain in joint, shoulder region 06/10/2013  . Right leg swelling 06/03/2013  . Essential hypertension 06/03/2013  . Bradycardia by electrocardiogram 06/03/2013  . Obstructive sleep apnea 03/12/2013  . Spinal stenosis, lumbar region, with neurogenic claudication 12/14/2012    Alanson Puls 02/02/2015, 9:03 AM  [place] MAIN [facility] SERVICES [place], [place], [address] Phone: [phone]   Fax:  [phone]  Name: [name] MRN: [id number] Date of Birth: May 11, 1950

## 2015-02-03 ENCOUNTER — Ambulatory Visit: Payer: Medicare PPO | Admitting: Physical Therapy

## 2015-02-04 ENCOUNTER — Ambulatory Visit: Payer: Medicare PPO | Admitting: Physical Therapy

## 2015-02-04 ENCOUNTER — Ambulatory Visit: Payer: Self-pay | Admitting: Physical Therapy

## 2015-02-05 ENCOUNTER — Ambulatory Visit: Payer: Medicare PPO | Admitting: Physical Therapy

## 2015-02-09 ENCOUNTER — Telehealth: Payer: Self-pay | Admitting: Neurology

## 2015-02-09 NOTE — Telephone Encounter (Signed)
Spoke with Iris at [facility] she states they would like to treat patient for benign contractor ligaments in his left hand with radiation. She spoke to [employer] who told her they believe this will be safe. They will not be able to treat him with the neurostimulator off because the patient will need to be still for treatments. Please advise if this can stay on for radiation.

## 2015-02-09 NOTE — Telephone Encounter (Signed)
LMOM for [name] making her aware of this.

## 2015-02-09 NOTE — Telephone Encounter (Signed)
Iris/RN/ from [place] center//called to ask if the Deep Brain Stimulator should be left on or off for his cancer tx//call back @ [phone]

## 2015-02-09 NOTE — Telephone Encounter (Signed)
If [employer] told her it would be safe, then I suspect it will.  I don't see a problem with radiation and DBS device.  The issue is with cautery and magnetic devices like MRI that would heat the device, but I am completely deferring to the company

## 2015-02-12 ENCOUNTER — Ambulatory Visit (INDEPENDENT_AMBULATORY_CARE_PROVIDER_SITE_OTHER): Payer: Medicare PPO | Admitting: Psychology

## 2015-02-12 DIAGNOSIS — F321 Major depressive disorder, single episode, moderate: Secondary | ICD-10-CM

## 2015-02-17 ENCOUNTER — Encounter: Payer: Self-pay | Admitting: Family Medicine

## 2015-02-17 ENCOUNTER — Ambulatory Visit (INDEPENDENT_AMBULATORY_CARE_PROVIDER_SITE_OTHER): Payer: Medicare PPO | Admitting: Family Medicine

## 2015-02-17 VITALS — BP 122/70 | HR 48 | Temp 98.5°F | Wt 183.5 lb

## 2015-02-17 DIAGNOSIS — M10079 Idiopathic gout, unspecified ankle and foot: Secondary | ICD-10-CM | POA: Diagnosis not present

## 2015-02-17 MED ORDER — COLCHICINE 0.6 MG PO TABS: 0.6000 mg | ORAL_TABLET | Freq: Every day | ORAL | Status: DC | PRN

## 2015-02-17 NOTE — Progress Notes (Signed)
Pre visit review using our clinic review tool, if applicable. No additional management support is needed unless otherwise documented below in the visit note.  L foot pain.  No trauma.  Pain at the L 1st MTP.  H/o gout.  No recent flares.  Hasn't been on preventive meds.  Started tart cherry juice, hasn't started yet.  Can walk but with pain.  Pain if the sheet lays across the foot at night.  Hasn't been red but is puffy.  Felt warm, locally.  Meds, vitals, and allergies reviewed.   ROS: See HPI.  Otherwise, noncontributory.  nad L foot puffy at the L 1st mtp, tender and sore but not red.  Pain with ROM at the 1st toe.  NV intact o/w.  Foot not ttp o/w.

## 2015-02-17 NOTE — Patient Instructions (Signed)
Start colchicine.  Hold pravastatin while on med.  Okay to try cherry juice in the meantime.  Take care.  Glad to see you.  Please call Dr. Marchelle Folks office for input, if you don't hear from them.

## 2015-02-18 DIAGNOSIS — M109 Gout, unspecified: Secondary | ICD-10-CM | POA: Insufficient documentation

## 2015-02-18 NOTE — Assessment & Plan Note (Signed)
D/w pt.  Likely dx.  Start colchicine, hold statin for now.  Okay to try cherry juice.  F/u prn.  He agrees.

## 2015-02-24 ENCOUNTER — Other Ambulatory Visit: Payer: Self-pay | Admitting: Family Medicine

## 2015-02-24 ENCOUNTER — Telehealth: Payer: Self-pay

## 2015-02-24 MED ORDER — PREDNISONE 20 MG PO TABS: ORAL_TABLET | ORAL | Status: DC

## 2015-02-24 NOTE — Telephone Encounter (Signed)
Candi pts wife (DPR signed) said pt was seen on 02/17/15;pt has been taking colchicine daily; pt continues with lt foot pain,puffiness and feels warm;pt has developed redness at base of big toe. Candi request cb with what to do. Medicap.

## 2015-02-24 NOTE — Telephone Encounter (Signed)
I still think this is gout.  I would continue colchicine, stop naproxen, add on prednisone with food.  rx sent with taper instructions.  Update me if not better in a few days.  Thanks.  Would take pred in AM with food to try to limit insomnia from the medicine.

## 2015-02-24 NOTE — Telephone Encounter (Signed)
Patient's wife notified of these comments. Verbalized understanding.

## 2015-02-27 DIAGNOSIS — M720 Palmar fascial fibromatosis [Dupuytren]: Secondary | ICD-10-CM | POA: Diagnosis not present

## 2015-02-27 DIAGNOSIS — Z510 Encounter for antineoplastic radiation therapy: Secondary | ICD-10-CM | POA: Diagnosis not present

## 2015-03-05 ENCOUNTER — Ambulatory Visit (INDEPENDENT_AMBULATORY_CARE_PROVIDER_SITE_OTHER): Payer: Medicare PPO | Admitting: Psychology

## 2015-03-05 DIAGNOSIS — F321 Major depressive disorder, single episode, moderate: Secondary | ICD-10-CM

## 2015-03-06 ENCOUNTER — Encounter: Payer: Self-pay | Admitting: Internal Medicine

## 2015-03-06 ENCOUNTER — Ambulatory Visit (INDEPENDENT_AMBULATORY_CARE_PROVIDER_SITE_OTHER): Payer: Medicare PPO | Admitting: Internal Medicine

## 2015-03-06 VITALS — BP 118/72 | HR 54 | Ht 69.0 in | Wt 184.6 lb

## 2015-03-06 DIAGNOSIS — G4733 Obstructive sleep apnea (adult) (pediatric): Secondary | ICD-10-CM

## 2015-03-06 NOTE — Progress Notes (Addendum)
[facility] Consultation      Addendum 04/14/2015: Review of sleep study and titration study reports and raw data summary: Sleep study from 03/25/2015 showed an apnea popping index of 10, the subsequent titration study showed that the patient needed a CPAP of 13, there that all of her respiratory events were eliminated and CPAP level of 13. In addition, there was no evidence of REM behavior disorder symptoms seen during this study. Nothing was noted in the tech comments.    Assessment and Plan:  Obstructive sleep apnea. -The patient has a history of obstructive sleep apnea diagnosed in 2012. He has not been compliant with CPAP since that time. He continues to have symptoms of excessive daytime somnolence somnolence, which could be multifactorial from his history of Parkinson's disease, medications, as well as sleep apnea. -We'll send for a CPAP titration study. We discussed the importance of staying on CPAP, and to continue to keep it try for at least 1 month.  Symptoms consistent with REM behavior disorder. -The patient is having bizarre dreams, with acting out these dreams. This could be secondary to Parkinson's disease, medications, or excessive daytime sleepiness associated with obstructive sleep apnea. -He had a severe depression reaction to Klonopin in the past, if his sleep study is consistent with REM behavior disorder can consider starting melatonin.  Excessive daytime sleepiness. -Likely combination of Parkinson's disease, as well as untreated obstructive sleep apnea.  Depression  Parkinson's disease, with dysphagia. -Has advanced disease, is currently on a neuro stimulator as well as Sinemet. He also has dysphagia with occasional choking episodes, recommended that he stick with the diet prescribed by speech therapy.  Snoring  Date: 03/06/2015  MRN# [id number] [name] 17-Jul-1950  Referring Physician: Elsie Stain.   [name] is a 64 y.o.  old male seen in consultation for chief complaint of:    Chief Complaint  Patient presents with  . SLEEP CONSULT    pt. ref. by dr. Damita Dunnings. daytime sleepiness. snoring loudly. had sleep study in 2012. kept taking mask off.    HPI:  The patient is a 64 year old male, he has a history of obstructive sleep apnea diagnosed on a polysomnogram on 06/17/2010, with an apnea hypopnea index of 12.  He also has a history of Parkinson's disease, treated with multiple medications as well as DBS.  He was seen by his neurologist, Dr. Carles Collet, who had added Provigil for the patient's excessive sleepiness. He was also noted that he was acting out his dreams. Therefore, he was started on clonazepam 0.5 mg nightly, however, the clonazepam made him very sleepy and depressed. He is present today with his wife who provides much of the history.   He is referred today as he has not been able to tolerate CPAP and he has been having very vivid dreams, and has started acting out his dreams. This has been progressive over the past year. This has progressed since getting the DBS.  He was started on CPAP about 4 years ago and tried cpap for about a week. He would rip off the CPAP and not remember, he would not put it back on. He then stopped using it after about a week.  The klonopin was stopped due to severe depression. He is currently taking welbutrin for depression and that seems to help. He continues to take carbidopa for PD but the other meds have been discontinued.   Without his DBS he has fairly severe jerking and tremors. He has been  having trouble eating, he notes that he chokes a lot, he has trouble with particular foods. He had a swallowing study and told to use thickener, and asked to take smaller bites, but has not really been compliant with that.   He does have some reflux and heartburn.  Goes to bed at 9pm, wakes at 6. Takes a nap on most days, usually 2 hours.    Review of testing: nocturnal polysomnogram on  06/20/2010 demonstrating an apnea hypopnea index of 16 Modified barium swallow on 02/05/2014 that demonstrated moderate pharyngeal phase dysphagia and recommended a dysphagia 3 diet (mechanical soft) with nectar thick liquids  PMHX:   Past Medical History  Diagnosis Date  . Parkinson's disease ([place])     dx'ed 15 years ago  . PTSD (post-traumatic stress disorder)   . Bradycardia   . Hypertension     treated with HCTZ  . Sleep apnea     doesn't use C-pap  . Varicose veins   . Cancer [facility] 2013    skin cancer  . Arthritis   . History of chicken pox   . GERD (gastroesophageal reflux disease)   . Dysrhythmia     chronic slow heart rate  . Depression     ptsd  . History of kidney stones   . Headache(784.0)     tension headaches non recent  . Shortness of breath dyspnea    Surgical Hx:  Past Surgical History  Procedure Laterality Date  . Cyst removed       from lip as a child  . Skin cancer removed      from ears,   11 lft arm  rt leg 14  . Lumbar laminectomy/decompression microdiscectomy Bilateral 12/14/2012    Procedure: Bilateral lumbar three-four, four-five decompressive laminotomy/foraminotomy;  Surgeon: Charlie Pitter, MD;  Location: [place] [facility];  Service: Neurosurgery;  Laterality: Bilateral;  . Subthalamic stimulator insertion Bilateral 12/06/2013    Procedure: SUBTHALAMIC STIMULATOR INSERTION;  Surgeon: Erline Levine, MD;  Location: [place] [facility];  Service: Neurosurgery;  Laterality: Bilateral;  Bilateral deep brain stimulator placement  . Pulse generator implant Bilateral 12/13/2013    Procedure: Bilateral implantable pulse generator placement;  Surgeon: Erline Levine, MD;  Location: [place] [facility];  Service: Neurosurgery;  Laterality: Bilateral;  Bilateral implantable pulse generator placement  . Cholecystectomy N/A 10/22/2014    Procedure: LAPAROSCOPIC CHOLECYSTECTOMY WITH INTRAOPERATIVE CHOLANGIOGRAM;  Surgeon: Dia Crawford III, MD;  Location: [facility] ORS;  Service: General;   Laterality: N/A;   Family Hx:  Family History  Problem Relation Age of Onset  . Colon cancer Neg Hx   . Prostate cancer Neg Hx    Social Hx:   Social History  Substance Use Topics  . Smoking status: Never Smoker   . Smokeless tobacco: Former Systems developer  . Alcohol Use: 0.0 oz/week    0 Standard drinks or equivalent per week     Comment: occasional (twice per month)   Medication:   Current Outpatient Rx  Name  Route  Sig  Dispense  Refill  . buPROPion (WELLBUTRIN XL) 150 MG 24 hr tablet   Oral   Take 3 tablets (450 mg total) by mouth daily.   270 tablet   1   . carbidopa-levodopa (SINEMET IR) 25-100 MG per tablet   Oral   Take by mouth. Take 2 tablets by mouth in the morning, 1 tablets in the afternoon and take 1 tablet by mouth in the evening.         Marland Kitchen  cholecalciferol (VITAMIN D) 1000 UNITS tablet   Oral   Take 1,000 Units by mouth daily.         . colchicine 0.6 MG tablet   Oral   Take 1 tablet (0.6 mg total) by mouth daily as needed.   30 tablet   0     Okay to fill with mitigare if needed.   . hydrochlorothiazide (HYDRODIURIL) 25 MG tablet      TAKE 1 TABLET DAILY   90 tablet   3   . ketotifen (ZADITOR) 0.025 % ophthalmic solution   Both Eyes   Place 1 drop into both eyes 2 (two) times daily. Patient taking differently: Place 1 drop into both eyes 2 (two) times daily as needed.    5 mL   1   . naproxen sodium (ANAPROX) 220 MG tablet   Oral   Take 220 mg by mouth 2 (two) times daily as needed.          Marland Kitchen oxyCODONE-acetaminophen (PERCOCET/ROXICET) 5-325 MG per tablet   Oral   Take 1 tablet by mouth every 6 (six) hours as needed for moderate pain.   30 tablet   0   . pravastatin (PRAVACHOL) 20 MG tablet      TAKE 1 TABLET EVERY EVENING   90 tablet   3   . predniSONE (DELTASONE) 20 MG tablet      2 a day for 4 days, then 1 a day for 4 days, then 1/2 tabs as day for 4 days.  With food.   14 tablet   0       Allergies:  Klonopin and  Morphine and related  Review of Systems: Gen:  Denies  fever, sweats, chills HEENT: Denies blurred vision, double vision. bleeds, sore throat Cvc:  No dizziness, chest pain. Resp:   Denies cough or sputum porduction, shortness of breath Gi: Denies swallowing difficulty, stomach pain. Gu:  Denies bladder incontinence, burning urine Ext:   No Joint pain, stiffness. Skin: No skin rash,  hives Endoc:  No polyuria, polydipsia. Psych: No depression, insomnia. Other:  All other systems were reviewed with the patient and were negative other that what is mentioned in the HPI.   Physical Examination:   VS: BP 118/72 mmHg  Pulse 54  Ht 5\' 9"  (1.753 m)  Wt 184 lb 9.6 oz (83.734 kg)  BMI 27.25 kg/m2  SpO2 97%  General Appearance: No distress  Neuro:without focal findings,  speech slurred. HEENT: PERRLA, EOM intact.  Mallampati 3. Pulmonary: normal breath sounds, No wheezing.  CardiovascularNormal S1,S2.  No m/r/g.   Abdomen: Benign, Soft, non-tender. Renal:  No costovertebral tenderness  GU:  No performed at this time. Endoc: No evident thyromegaly, no signs of acromegaly. Skin:   warm, no rashes, no ecchymosis  Extremities: normal, no cyanosis, clubbing.  Other findings:    LABORATORY PANEL:   CBC No results for input(s): WBC, HGB, HCT, PLT in the last 168 hours. ------------------------------------------------------------------------------------------------------------------  Chemistries  No results for input(s): NA, K, CL, CO2, GLUCOSE, BUN, CREATININE, CALCIUM, MG, AST, ALT, ALKPHOS, BILITOT in the last 168 hours.  Invalid input(s): GFRCGP ------------------------------------------------------------------------------------------------------------------  Cardiac Enzymes No results for input(s): TROPONINI in the last 168 hours. ------------------------------------------------------------  RADIOLOGY:  No results found.     Thank  you for the consultation and for  allowing [facility] to assist in the care of your patient. Our recommendations are noted above.  Please contact us if we can be  of further service.   Marda Stalker, MD.  Board Certified in Internal Medicine, Pulmonary Medicine, [place], and Sleep Medicine.  [facility] Office Number: [phone]  Patricia Pesa, M.D.  Vilinda Boehringer, M.D.  Merton Border, M.D

## 2015-03-06 NOTE — Patient Instructions (Signed)
--  In-lab CPAP titration study.   Sleep Apnea Sleep apnea is disorder that affects a person's sleep. A person with sleep apnea has abnormal pauses in their breathing when they sleep. It is hard for them to get a good sleep. This makes a person tired during the day. It also can lead to other physical problems. There are three types of sleep apnea. One type is when breathing stops for a short time because your airway is blocked (obstructive sleep apnea). Another type is when the brain sometimes fails to give the normal signal to breathe to the muscles that control your breathing (central sleep apnea). The third type is a combination of the other two types. HOME CARE   Take all medicine as told by your doctor.  Avoid alcohol, calming medicines (sedatives), and depressant drugs.  Try to lose weight if you are overweight. Talk to your doctor about a healthy weight goal.  Your doctor may have you use a device that helps to open your airway. It can help you get the air that you need. It is called a positive airway pressure (PAP) device.   MAKE SURE YOU:   Understand these instructions.  Will watch your condition.  Will get help right away if you are not doing well or get worse.  It may take approximately 1 month for you to get used to wearing her CPAP every night.  Be sure to work with

## 2015-03-09 DIAGNOSIS — L821 Other seborrheic keratosis: Secondary | ICD-10-CM | POA: Diagnosis not present

## 2015-03-09 DIAGNOSIS — D485 Neoplasm of uncertain behavior of skin: Secondary | ICD-10-CM | POA: Diagnosis not present

## 2015-03-09 DIAGNOSIS — Z872 Personal history of diseases of the skin and subcutaneous tissue: Secondary | ICD-10-CM | POA: Diagnosis not present

## 2015-03-09 DIAGNOSIS — Z1283 Encounter for screening for malignant neoplasm of skin: Secondary | ICD-10-CM | POA: Diagnosis not present

## 2015-03-09 DIAGNOSIS — L728 Other follicular cysts of the skin and subcutaneous tissue: Secondary | ICD-10-CM | POA: Diagnosis not present

## 2015-03-11 DIAGNOSIS — M720 Palmar fascial fibromatosis [Dupuytren]: Secondary | ICD-10-CM | POA: Diagnosis not present

## 2015-03-11 DIAGNOSIS — Z510 Encounter for antineoplastic radiation therapy: Secondary | ICD-10-CM | POA: Diagnosis not present

## 2015-03-18 DIAGNOSIS — M720 Palmar fascial fibromatosis [Dupuytren]: Secondary | ICD-10-CM | POA: Diagnosis not present

## 2015-03-18 DIAGNOSIS — Z510 Encounter for antineoplastic radiation therapy: Secondary | ICD-10-CM | POA: Diagnosis not present

## 2015-03-19 DIAGNOSIS — Z510 Encounter for antineoplastic radiation therapy: Secondary | ICD-10-CM | POA: Diagnosis not present

## 2015-03-19 DIAGNOSIS — M720 Palmar fascial fibromatosis [Dupuytren]: Secondary | ICD-10-CM | POA: Diagnosis not present

## 2015-03-20 DIAGNOSIS — M720 Palmar fascial fibromatosis [Dupuytren]: Secondary | ICD-10-CM | POA: Diagnosis not present

## 2015-03-20 DIAGNOSIS — Z6827 Body mass index (BMI) 27.0-27.9, adult: Secondary | ICD-10-CM | POA: Diagnosis not present

## 2015-03-20 DIAGNOSIS — M4317 Spondylolisthesis, lumbosacral region: Secondary | ICD-10-CM | POA: Diagnosis not present

## 2015-03-20 DIAGNOSIS — Z510 Encounter for antineoplastic radiation therapy: Secondary | ICD-10-CM | POA: Diagnosis not present

## 2015-03-23 DIAGNOSIS — M720 Palmar fascial fibromatosis [Dupuytren]: Secondary | ICD-10-CM | POA: Diagnosis not present

## 2015-03-23 DIAGNOSIS — Z510 Encounter for antineoplastic radiation therapy: Secondary | ICD-10-CM | POA: Diagnosis not present

## 2015-03-24 ENCOUNTER — Encounter: Payer: Self-pay | Admitting: Family Medicine

## 2015-03-24 ENCOUNTER — Other Ambulatory Visit: Payer: Self-pay | Admitting: Family Medicine

## 2015-03-24 DIAGNOSIS — M720 Palmar fascial fibromatosis [Dupuytren]: Secondary | ICD-10-CM | POA: Diagnosis not present

## 2015-03-24 DIAGNOSIS — Z510 Encounter for antineoplastic radiation therapy: Secondary | ICD-10-CM | POA: Diagnosis not present

## 2015-03-24 MED ORDER — PREDNISONE 20 MG PO TABS: ORAL_TABLET | ORAL | Status: DC

## 2015-03-24 MED ORDER — COLCHICINE 0.6 MG PO TABS: 0.6000 mg | ORAL_TABLET | Freq: Every day | ORAL | Status: DC | PRN

## 2015-03-25 ENCOUNTER — Ambulatory Visit: Payer: Medicare PPO | Attending: Pulmonary Disease

## 2015-03-25 DIAGNOSIS — G4733 Obstructive sleep apnea (adult) (pediatric): Secondary | ICD-10-CM | POA: Insufficient documentation

## 2015-03-25 DIAGNOSIS — M720 Palmar fascial fibromatosis [Dupuytren]: Secondary | ICD-10-CM | POA: Diagnosis not present

## 2015-03-25 DIAGNOSIS — Z510 Encounter for antineoplastic radiation therapy: Secondary | ICD-10-CM | POA: Diagnosis not present

## 2015-03-26 DIAGNOSIS — Z510 Encounter for antineoplastic radiation therapy: Secondary | ICD-10-CM | POA: Diagnosis not present

## 2015-03-26 DIAGNOSIS — M720 Palmar fascial fibromatosis [Dupuytren]: Secondary | ICD-10-CM | POA: Diagnosis not present

## 2015-03-27 DIAGNOSIS — G4733 Obstructive sleep apnea (adult) (pediatric): Secondary | ICD-10-CM | POA: Diagnosis not present

## 2015-04-01 ENCOUNTER — Encounter: Payer: Self-pay | Admitting: Neurology

## 2015-04-01 ENCOUNTER — Ambulatory Visit (INDEPENDENT_AMBULATORY_CARE_PROVIDER_SITE_OTHER): Payer: Medicare PPO | Admitting: Neurology

## 2015-04-01 VITALS — BP 124/62 | HR 54 | Ht 69.0 in | Wt 188.0 lb

## 2015-04-01 DIAGNOSIS — F330 Major depressive disorder, recurrent, mild: Secondary | ICD-10-CM

## 2015-04-01 DIAGNOSIS — Z9689 Presence of other specified functional implants: Secondary | ICD-10-CM | POA: Diagnosis not present

## 2015-04-01 DIAGNOSIS — G20 Parkinson's disease: Secondary | ICD-10-CM | POA: Diagnosis not present

## 2015-04-01 DIAGNOSIS — G4752 REM sleep behavior disorder: Secondary | ICD-10-CM | POA: Diagnosis not present

## 2015-04-01 DIAGNOSIS — G20A1 Parkinson's disease without dyskinesia, without mention of fluctuations: Secondary | ICD-10-CM

## 2015-04-01 NOTE — Procedures (Signed)
DBS Programming was performed.    Total time spent programming was 30 minutes.  Device was confirmed to be on.  Soft start was confirmed to be on at 8.  Impedences were checked and were within normal limits.  Battery was checked and was determined to be functioning normally and not near the end of life.   This was all documented on a separate neurophysiologic worksheet.  Final settings were as follows:  Left brain electrode:     1-C+           ; Amplitude  4.0   V   ; Pulse width 90 microseconds;   Frequency   140   Hz.  Right brain electrode:     2-C+          ; Amplitude   4.3  V ;  Pulse width 60  microseconds;  Frequency   130    Hz.

## 2015-04-01 NOTE — Progress Notes (Signed)
[name] was seen today in the movement disorders clinic for neurologic consultation at the request of Dr. Annette Mcgee. His PCP is at the [facility] in [place].  The consultation is for the evaluation of Parkinson's disease.  The patient is accompanied by his wife who supplements the history.  The patient previously saw Dr. Jennelle Mcgee at [facility] and I do have several of those records.  Patient was diagnosed with Parkinson's disease about 15 years ago, at the age of 74-65 years old.  He states that the first sx was not swinging of the R arm and some minor balance changes.  Balance has gotten worse with time.   The patient is currently on carbidopa/levodopa 25/100, 3 at 9am, 2 at 1, 3 at 5pm, 2 at 9 pm and 1 carbidopa/levodopa 25/100 CR at bedtime.  He is also on Mirapex 0.5 mg 3 times a day and selegiline 5 mg daily.  Because of dyskinesia, Dr. Nicki Mcgee did talk to him about DBS, but according to Dr. Bary Mcgee notes, the patient was skeptical.  He did have a neuropsych evaluation at the [facility] in [place], but it appears that this was more of counseling and no testing.  He did have a nocturnal polysomnogram on 06/20/2010 demonstrating an apnea hypopnea index of 16.  07/11/13 update:  Pt is f/u today re: PD.  This patient is accompanied in the office by his spouse who supplements the history.  He is on carbidopa/levodopa, 3 at 9am, 2 at 1, 3 at 5pm, 2 at 9 pm and takes carbidopa/levodopa CR 25/100  at bedtime.  This is a total daily levodopa dose of 1100 mg per day.  He is on mirapex 0.5 mg three times per day and selegeline 5 mg in the AM.  He states, "I am ready for DBS.  Tell me about what I need to do."  Pt c/o dyskinesias.  Falls on average once per week.  No hallucinations.  No n/v.  No syncope.  States that he saw himself singing at [place] and could not believe how much dyskinesia he had.  He states that when his medications don't work, he "hits a wall."  This is sometimes very unpredictable.  07/24/13:  Present with  wife for on/off test.  Actually went to PT late yesterday afternoon and was off of medication and had a very hard time.  While he has been off medication for 36 hours, his wife reports that she thinks that he has done remarkably well.  There has been minimal tremor and he really hasn't been dragging his leg much.  He expresses desire to proceed with DBS.  10/25/13 update:  Patient presents today with his wife, who supplements the history.  The patient is preop DBS.  He is scheduled for deep brain stimulation surgery on 12/06/2013.  He is scheduled for IPG placement on 12/13/2013.  Since our last visit, he did have neuropsychometric testing.  Results of this were favorable for DBS placement.  It was recommended that he faithfully wear his CPAP.  He just had his preop MRI of the brain done.  Patient is currently taking levodopa 25/100, on the following schedule: 3/2/3/2 and then an additional carbidopa/levodopa 25/100 CR at bedtime.  This is in addition to pramipexole 0.5 mg 3 times a day and selegiline 5 mg in the morning.  Overall doing well.  No hallucinations.  No falls.  Has had some more of his chronic back pain.  01/08/14 update:  The patient  presents today for followup.  He is accompanied by his wife who supplements the history.  The patient underwent bilateral STN DBS on 12/06/2013 with generator placement on 12/13/2013.  The patient's wife reports that his Parkinson's disease symptoms have actually been much better since surgery.  He did go back to taking his previous dose of medication, however.  He is on carbidopa/levodopa 25/100 on the following schedule: 3/2/3/2 and then he takes an additional carbidopa/levodopa 25/100 CR at night.  He is also on Mirapex 0.5 mg 3 times a day as well as selegiline 5 mg in the morning.  He had 2 falls since last visit.  On the one, he actually fell down a few stairs and states that he was actually able to roll onto the carpet.  He did not get hurt.  With the other one,  he states that he was walking on a flat surface and just did not expect there to be a step downward and there was.  Again, he did not get hurt.  Wife reports minor dyskinesia.  They have noted no tremor.  Rigidity has been better.  He has been very sleepy.  Wife thinks that this is because he only sleeps about 4 hours at night because he is trying to do so much during the day.  01/16/14 update:  Patient returns today, accompanied by his wife who supplements the history.  Overall, the patient has been doing well.  He remains very sleepy.  He is not sure if that is from medication.  He refuses to wear the CPAP.  He is supposed to be on a tablet of carbidopa/levodopa 25/100 per day but has dropped down to 6 on his own.  He is on pramipexole 0.5 mg twice per day.  He does not want to start speech therapy until November, because he is going out of town.  He had one fall on the stairs since last visit.  Reports that he has an old house with small steps.  He denies any dyskinesia.  He has had some swelling in the hands, right greater than left.  He has an appointment to discuss this with his primary care physician.  He denies any tremor.    01/22/14 update:  Patient returns today for followup, accompanied by his wife who supplements the history.  Patient called me almost immediately after last visit complaining about dyskinesia and he wanted me to turn his settings back down.  I had told him to instead to decrease his levodopa up to 2 tablets in the morning, one in the afternoon and one in the evening and we had already been weaning his Mirapex.  That stopped the dyskinesias which were in the R arm and he admits that some days he is only taking 3 levodopa per day.  He is sometimes having what sounds like myoclonic jerking in the R arm.  He also c/o "aching" in the joints all over the body "but it doesn't feel like the stiffness of parkinsons disease."  He still has swelling.  He did f/u with PCP but etiology is unknown.     02/17/14 update:  This patient is accompanied in the office by his spouse who supplements the history.  Has had several falls.  Almost fell into the fireplace.  [place] backwards going down the stairs.  No serious injuries but still multiple falls.  Generally occur on the stairs.  Wrecked his [place] as missed the brake.  No tremor but did turn off his device  and notes that tremor will immediately return.  Off the mirapex but still sleeping "all the time."  Wife states that no longer doing the things that he is to work to do.  He is less involved with the church, which he used to be very passionate about.  He is not exercising like he was.  He started speech therapy today.  He had a  Modified barium swallow on 02/05/2014 that demonstrated moderate pharyngeal phase dysphagia and recommended a dysphagia 3 diet (mechanical soft) with nectar thick liquids.  They just got the nectar thick-it today.  His wife thinks that perhaps he is depressed.  She thinks that maybe that is why he is sleeping all the time.the patient also asked me about a referral to orthopedics for a rotator cuff problem on the left.  He is having severe pain, but does not necessarily want surgery.  04/15/14 update:  The patient returns today for follow-up, accompanied by his wife who supplements the history.  He remains on carbidopa/levodopa 25/100, 1 tablet 4 times per day.  He was started on Wellbutrin last visit.  This has been worked up to 300 mg daily.  His mood is much better.  His motivation is much better.  He is sleeping much less.  His balance is not very good.  He has not yet started physical therapy, but plans to next week.  He has finished LSVT loud.  He notices some tremor of the left hand and tremor of the right face.  We did have him try to turn off the DBS to see what would happen with the tremor right face and he got much worse.  He has seen Dr. Maureen Ralphs and had cortisone injections into the right shoulder.  That has helped.  He had an  EMG that demonstrated chronic multilevel radiculopathy from L2-S1 bilaterally.  There was no evidence of peripheral neuropathy.  05/08/13 update:  This patient is accompanied in the office by his spouse who supplements the history.  Overall, the patient is doing well but he is contemplating back surgery.  He saw Dr. Trenton Gammon regarding this and is supposed to have a CT myelogram soon.  He presents today because he is having some tremor of the right face and arm and left arm tremor, especially when he is stressed.  He takes 2 levodopa in the morning, one in the afternoon and one in the evening.  He really does fairly well with that.  Tremor is worse in the evening.  No falls.  He is doing fairly well with Wellbutrin for his mood.  He has put physical therapy on hold.  07/03/14 update:  Patient is accompanied by his wife, who supplements the history.  He had a back surgery on 06/13/2014 by Dr. Trenton Gammon.  Records are reviewed from the surgery.  He had an L1-L2 laminectomy and reexplored the L4-L5 laminectomies with foraminotomies.  The patient was released from rehabilitation on March 11 and subsequently came to our Parkinson's educational event.  At that event, we talked about the fact that DBS can worsen speech and he thinks that perhaps the device has caused some speech difficulties.  His wife also thinks that his mood has gotten worse, especially since his most recent back surgery.  He is doing better in terms of the back pain.  He is weaning off of gabapentin.  Physical therapy is coming into his house.  He has noticed some left hand and right leg tremor.  07/28/14 update:  Pt is  accompanied by his wife, who supplements the history.  He is continuing to have EDS, which he has had long prior to DBS therapy.  He has OSAS but has not been able to tolerate CPAP therapy.  He has no cardiac disease.  We made significant changes to his programing last visit because of stuttering/speech issues and his wife states this is much  better.  He is going to resume ST because of choking issues and that is to restart tomorrow.  He is having some tremor in the L hand and right shoulder.  When asked about mood, his wife states that "he needs an attitude adjustment some days."  She does state that his driving seems much better after his gabapentin was discontinued.  He is not having any lightheadedness.  No hallucinations.  No confusion.  11/28/14 update:  The patient is following up today, accompanied by his wife who supplements the history.  He is on carbidopa/levodopa 25/100, 2 tablets in the morning, one in the afternoon and one in the evening but admits that he really doesn't take it this way and is lucky to get in 3 per day (hadn't taken any when I saw him today at 11:15 pm).  Last visit, I added Provigil because of excessive daytime hypersomnolence and we ultimately tried to increase it over the phone to twice a day dosing, but by June, the patient had discontinued it because he was doing better from this regard.  He called me on June 6, however complaining about acting out his dreams.  We added clonazepam, 0.5 mg, half a tablet at night.  Only a week later the patient had discontinued as he felt that it perhaps worsened his mood but he also thought that it caused diarrhea.  Not long after that, however, the patient ended up in the emergency room with abdominal pain and was diagnosed with a porcelain gallbladder and had surgery to have his gallbladder removed on 10/21/2014.  His wife states today, however, that the klonopin made him seriously depressed.  He did hallucinate with the morphine in the hospital.  His wife states that they saw someone they hadn't seen in a longtime and they commented how well he looked.  He has started PT several times and had to stop for various reasons.  He attended a support group in [place].    04/01/15 update:  The patient is following up today, accompanied by his wife who supplements the history.  The  patient has Parkinson's disease and is status post deep brain stimulator placement.  He remains on carbidopa/levodopa 25/100, 2 tablets in the morning, one in the afternoon and one in the evening but instead of taking the last dose in the evening he will take it at bedtime and instead of taking the one in the middle of the afternoon he will take that late evening.  Therefore, he will end up having tremor in the right hand late in the day.  Overall, he is doing well and "most days you don't know he has PD" according to his wife.  He has been going to physical therapy at [facility].  His wife states that insurance only covered 3 weeks of therapy and then cut him off and his wife is hoping to re-enroll him after the first of the year.    I have reviewed records since our last visit.  Dr. Damita Dunnings increased his Wellbutrin XL from 300 mg to 450 mg on 01/06/2015.  That helped a lot and he  is more like his old self.   The patient has also seen Dr. Ashby Dawes since our last visit.  He has a history of known sleep apnea and has been noncompliant with his CPAP.  Dr. Ashby Dawes recommended a re-titration study and the patient is awaiting that study.  Vivid dreams continue.     Neuroimaging has  previously been performed.  It is not available for my review today.  PREVIOUS MEDICATIONS: Sinemet, Sinemet CR, Mirapex and Eldpryl; klonopin (depressed)  ALLERGIES:   Allergies  Allergen Reactions  . Klonopin [Clonazepam] Other (See Comments)    Worsening mood/depression  . Morphine And Related     hallucinations    CURRENT MEDICATIONS:    Medication List       This list is accurate as of: 04/01/15 11:20 AM.  Always use your most recent med list.               buPROPion 150 MG 24 hr tablet  Commonly known as:  WELLBUTRIN XL  Take 3 tablets (450 mg total) by mouth daily.     carbidopa-levodopa 25-100 MG tablet  Commonly known as:  SINEMET IR  Take by mouth. Take 2 tablets by mouth in the morning, 1  tablets in the afternoon and take 1 tablet by mouth in the evening.     cholecalciferol 1000 UNITS tablet  Commonly known as:  VITAMIN D  Take 1,000 Units by mouth daily.     hydrochlorothiazide 25 MG tablet  Commonly known as:  HYDRODIURIL  TAKE 1 TABLET DAILY     pravastatin 20 MG tablet  Commonly known as:  PRAVACHOL  TAKE 1 TABLET EVERY EVENING         PAST MEDICAL HISTORY:   Past Medical History  Diagnosis Date  . Parkinson's disease ([place])     dx'ed 15 years ago  . PTSD (post-traumatic stress disorder)   . Bradycardia   . Hypertension     treated with HCTZ  . Sleep apnea     doesn't use C-pap  . Varicose veins   . Cancer [facility]) 2013    skin cancer  . Arthritis   . History of chicken pox   . GERD (gastroesophageal reflux disease)   . Dysrhythmia     chronic slow heart rate  . Depression     ptsd  . History of kidney stones   . Headache(784.0)     tension headaches non recent  . Shortness of breath dyspnea     PAST SURGICAL HISTORY:   Past Surgical History  Procedure Laterality Date  . Cyst removed       from lip as a child  . Skin cancer removed      from ears,   11 lft arm  rt leg 14  . Lumbar laminectomy/decompression microdiscectomy Bilateral 12/14/2012    Procedure: Bilateral lumbar three-four, four-five decompressive laminotomy/foraminotomy;  Surgeon: Charlie Pitter, MD;  Location: [facility] NEURO ORS;  Service: Neurosurgery;  Laterality: Bilateral;  . Subthalamic stimulator insertion Bilateral 12/06/2013    Procedure: SUBTHALAMIC STIMULATOR INSERTION;  Surgeon: Erline Levine, MD;  Location: [facility] NEURO ORS;  Service: Neurosurgery;  Laterality: Bilateral;  Bilateral deep brain stimulator placement  . Pulse generator implant Bilateral 12/13/2013    Procedure: Bilateral implantable pulse generator placement;  Surgeon: Erline Levine, MD;  Location: [facility] NEURO ORS;  Service: Neurosurgery;  Laterality: Bilateral;  Bilateral implantable pulse generator placement  .  Cholecystectomy N/A 10/22/2014    Procedure: LAPAROSCOPIC CHOLECYSTECTOMY WITH INTRAOPERATIVE  CHOLANGIOGRAM;  Surgeon: Dia Crawford III, MD;  Location: [facility] ORS;  Service: General;  Laterality: N/A;    SOCIAL HISTORY:   Social History   Social History  . Marital Status: Married    Spouse Name: N/A  . Number of Children: N/A  . Years of Education: N/A   Occupational History  . disabled     2001, [employer]  .      search and rescue helicopter   Social History Main Topics  . Smoking status: Never Smoker   . Smokeless tobacco: Former Systems developer  . Alcohol Use: 0.0 oz/week    0 Standard drinks or equivalent per week     Comment: occasional (twice per month)  . Drug Use: No  . Sexual Activity: Not on file   Other Topics Concern  . 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    Married 2014    FAMILY HISTORY:   Family Status  Relation Status Death Age  . Mother Alive     Arthritis, unknown medical hx  . Father Deceased     Lung Cancer  . Sister Alive     1 and one half sister  . Brother Alive   . Son Alive   . Daughter Alive     2    ROS:  A complete 10 system review of systems was obtained and was unremarkable apart from what is mentioned above.  PHYSICAL EXAMINATION:    VITALS:   Filed Vitals:   04/01/15 1100  BP: 124/62  Pulse: 54  Height: 5\' 9"  (1.753 m)  Weight: 188 lb (85.276 kg)    GEN:  The patient appears stated age and is in NAD.   HEENT:  Normocephalic, atraumatic.  The mucous membranes are moist. The superficial temporal arteries are without ropiness or tenderness. Cardiovascular: Regular rate and rhythm Lungs: Clear to auscultation bilaterally   Neurological examination:  Orientation: The patient is alert and oriented x3.  He is awake  Cranial nerves: There is good facial symmetry.  The speech is fluent but dysarthric but little better than previous visits.    Soft  palate rises symmetrically and there is no tongue deviation. Hearing is intact to conversational tone. Sensation: Sensation is intact to light touch throughout. Motor: Strength is 5/5 in the bilateral upper and lower extremities.   Shoulder shrug is equal and symmetric.  There is no pronator drift. Deep tendon reflexes: Deferred today.  Movement examination: Tone: There is normal tone today Abnormal movements: There is mild tremor in the LUE, mostly in the left thumb Coordination: There is minor decremation with rapid alternating movements bilaterally Gait and Station: The patient has no difficulty arising out of a deep-seated chair without the use of the hands. The patient's stride length is good.  Drags the leg a bit but attributes that to back surgery.  He is actually able to run down the hall today.  DBS programming was performed today which is described in more detail on a separate programming procedural notes.   ASSESSMENT/PLAN:  1.  Parkinsons disease by hx. He has been seeing Dr. Jennelle Mcgee through the [facility].    -The patient is status post bilateral STN DBS on 12/06/2013 with battery placement on 12/13/2013.  -off of mirapex  -talked about faithfully taking carbidopa/levodopa 25/100, 2 in the AM, 1 in the afternoon  and 1 in the evening.  Asked him to move these dosages closer together as they are supposed to be taken and hopefully that will help to alleviate some of the tremor.  Also reprogrammed the device him today.   -he is off of selegiline.   2.  LBP.   -Status post repeat L4-L5 laminotomy with foraminotomy and L1-L2 laminectomy on 06/13/2014 3.  Dysphagia  -He had a  Modified barium swallow on 02/05/2014 that demonstrated moderate pharyngeal phase dysphagia and recommended a dysphagia 3 diet (mechanical soft) with nectar thick liquids.   4.  OSAS with excessive daytime hypersomnolence.  -We talked about morbidity and mortality associated with untreated  sleep apnea.    He was not able to tolerate the cpap despite trying multiple masks.  He is now seeing sleep medicine again and is scheduled for another titration study. 5.  Depression  -better on Wellbutrin XL, 450 mg.  now being managed by primary care.  -felt near suicidal on klonopin 6.  RBD  -Still acting out the dreams some, but felt almost near suicidal on clonazepam. 7.  Follow-up in the next 76months, sooner should new neurologic issues arise.  Much greater than 50% of this visit was spent in counseling with the patient and the family.  Total face to face time:  25 min not including programming time

## 2015-04-02 ENCOUNTER — Ambulatory Visit (INDEPENDENT_AMBULATORY_CARE_PROVIDER_SITE_OTHER): Payer: Medicare PPO | Admitting: Psychology

## 2015-04-02 DIAGNOSIS — F321 Major depressive disorder, single episode, moderate: Secondary | ICD-10-CM | POA: Diagnosis not present

## 2015-04-08 DIAGNOSIS — D2271 Melanocytic nevi of right lower limb, including hip: Secondary | ICD-10-CM | POA: Diagnosis not present

## 2015-04-08 DIAGNOSIS — D485 Neoplasm of uncertain behavior of skin: Secondary | ICD-10-CM | POA: Diagnosis not present

## 2015-04-14 ENCOUNTER — Telehealth: Payer: Self-pay | Admitting: Internal Medicine

## 2015-04-14 NOTE — Telephone Encounter (Signed)
Called and spoke with [name], who is covering for [name] with [facility]  Cecille Rubin is going to check to see if [facility] provided CPAP machine for pt during hospital stay.  If [facility] did, Order will need to go to [facility]. Waiting on Cecille Rubin to return my call. Rhonda J Cobb

## 2015-04-14 NOTE — Telephone Encounter (Signed)
George Hobby, MD  Renelda Mom, LPN           Needs cpap @ 13.        ----------------- Called patient and he says that he does not have a DME company.  He says that the CPAP machine he currently has came from the hospital.   PR - please advise if you want to set up for new CPAP machine or is patient keeping the one he received from the hospital?

## 2015-04-15 ENCOUNTER — Other Ambulatory Visit: Payer: Self-pay | Admitting: Internal Medicine

## 2015-04-15 DIAGNOSIS — G4733 Obstructive sleep apnea (adult) (pediatric): Secondary | ICD-10-CM

## 2015-04-15 NOTE — Telephone Encounter (Signed)
Called to speak with patient, no answer. LMOAM for pt to return my call. Advised patient to look on his cpap machine that he was given in the hospital and see if there is a label or sticker indicating which DME provided CPAP for him in the hospital.  Asked patient to contact me back directly at [phone]. Rhonda J Cobb

## 2015-04-15 NOTE — Telephone Encounter (Signed)
Heard back from [place] with [facility] this morning. [facility] does not have a record for providing him with a CPAP Machine.  I will contact the patient and see if there is a label on his machine as to whom may have provided this for him.  Rhonda J Cobb

## 2015-04-15 NOTE — Telephone Encounter (Signed)
Pt returned call and stated that he received the CPAP at the [facility] hospital, therefore, he would not have a local DME company. Advised patient that I would get him established with [facility] in [place] for his cpap/supplies/support. Pt is aware of referral. Nothing else needed at this time. Rhonda J Cobb

## 2015-04-15 NOTE — Telephone Encounter (Signed)
[name], did you ever receive a call back from [facility]?

## 2015-04-20 ENCOUNTER — Encounter: Payer: Self-pay | Admitting: Neurology

## 2015-04-21 NOTE — Telephone Encounter (Signed)
Dr Carles Collet Juluis Rainier - He increased Levodopa.

## 2015-05-14 ENCOUNTER — Ambulatory Visit (INDEPENDENT_AMBULATORY_CARE_PROVIDER_SITE_OTHER): Payer: Medicare PPO | Admitting: Psychology

## 2015-05-14 ENCOUNTER — Telehealth: Payer: Self-pay

## 2015-05-14 DIAGNOSIS — F321 Major depressive disorder, single episode, moderate: Secondary | ICD-10-CM | POA: Diagnosis not present

## 2015-05-14 NOTE — Telephone Encounter (Signed)
Crystal case mgr with [employer] said in the past week pt has lost his cane;pt has fallen x 1 in the past week with no apparent injury. Pt has a rollator until can get another cane and request order for cane faxed to [employer] ASAP. Crystal does not have fax # for [employer] and request cb to pts wife Candy when order completed.pt last seen 02/17/15.

## 2015-05-15 NOTE — Telephone Encounter (Signed)
Order printed and signed.  [place] fax number secured from wife and faxed.

## 2015-05-15 NOTE — Telephone Encounter (Signed)
Please fax and order for a cane.  Dx parkinsons, G20.  Fall risk.  Thanks.

## 2015-05-15 NOTE — Telephone Encounter (Signed)
Wife was contacted but knew nothing about securing a fax number to [place].  I'll try to get the number and fax.

## 2015-05-25 DIAGNOSIS — G20 Parkinson's disease: Secondary | ICD-10-CM | POA: Diagnosis not present

## 2015-05-25 DIAGNOSIS — Z9181 History of falling: Secondary | ICD-10-CM | POA: Diagnosis not present

## 2015-05-27 DIAGNOSIS — G4733 Obstructive sleep apnea (adult) (pediatric): Secondary | ICD-10-CM | POA: Diagnosis not present

## 2015-05-31 ENCOUNTER — Encounter: Payer: Self-pay | Admitting: Internal Medicine

## 2015-06-01 ENCOUNTER — Encounter: Payer: Self-pay | Admitting: Internal Medicine

## 2015-06-01 ENCOUNTER — Ambulatory Visit
Admission: RE | Admit: 2015-06-01 | Discharge: 2015-06-01 | Disposition: A | Discharge status: Home / Self Care | Payer: Medicare PPO | Source: Ambulatory Visit | Attending: Internal Medicine | Admitting: Internal Medicine

## 2015-06-01 ENCOUNTER — Ambulatory Visit (INDEPENDENT_AMBULATORY_CARE_PROVIDER_SITE_OTHER): Payer: Medicare PPO | Admitting: Internal Medicine

## 2015-06-01 VITALS — BP 128/72 | HR 59 | Ht 69.0 in | Wt 189.2 lb

## 2015-06-01 DIAGNOSIS — R059 Cough, unspecified: Secondary | ICD-10-CM

## 2015-06-01 DIAGNOSIS — R05 Cough: Secondary | ICD-10-CM | POA: Diagnosis not present

## 2015-06-01 DIAGNOSIS — G4733 Obstructive sleep apnea (adult) (pediatric): Secondary | ICD-10-CM | POA: Diagnosis not present

## 2015-06-01 NOTE — Patient Instructions (Addendum)
--  Will check CXR.  --Will be reviewing your download data.

## 2015-06-01 NOTE — Addendum Note (Signed)
Addended by: Oscar La R on: 06/01/2015 10:34 AM   Modules accepted: Orders

## 2015-06-01 NOTE — Progress Notes (Signed)
* [facility]     Assessment and Plan:  Obstructive sleep apnea. -. The most recent CPAP titration study was reviewed. This appears to be adequately treated at a CPAP of 13. The patients OSA appears adequately treated at that level.  Symptoms consistent with REM behavior disorder. -No evidence of this on sleep study, however he could have some symptoms due to Parkinson's disease, medications, or excessive daytime sleepiness associated with obstructive sleep apnea. -Discussed starting melatonin today versus starting Ritalin.  Hypersomnia despite adequate sleep time. Excessive daytime sleepiness. -Likely due to  Parkinson's disease, as OSA is well controlled.  --Will start Ritalin once in the morning pending review of his download data, confirming that his sleep apnea is adequately treated.  Cough Given high risk of aspiration, will check CXR.   Depression  Parkinson's disease, with dysphagia. -Has advanced disease, is currently on a neuro stimulator as well as Sinemet. He also has dysphagia with occasional choking episodes, recommended that he stick with the diet prescribed by speech therapy.    Date: 06/01/2015  MRN# [id number] ROWIN GUNNELL 02/04/1951   [name] is a 65 y.o. old male seen in follow up for chief complaint of  Chief Complaint  Patient presents with  . Follow-up    pt. states he wears CPAP 4-5 hr. everynight. pressure set @ 13 feels it's a little too strong. no supplies needed. DME:[facility].      HPI:    He has started on cpap and feels that he is doing well with it. He is more awake during the day, wife is present and notes that he is more awake during the day.  Wife also notes that he continue to kick in his sleep, and talks and moves in his sleep.   Review of testing:  Sleep study 04/14/2015: Review of sleep study and titration study reports and raw data summary: Sleep study from 03/25/2015 showed an apnea hypoxia index of 10, the  subsequent titration study showed that the patient needed a CPAP of 13, at that level All of his respiratory events were eliminated at CPAP level of 13. In addition, there was no evidence of REM behavior disorder symptoms seen during this study. Nothing was noted in the tech comments. nocturnal polysomnogram on 06/20/2010 demonstrating an apnea hypopnea index of 16 Modified barium swallow on 02/05/2014 that demonstrated moderate pharyngeal phase dysphagia and recommended a dysphagia 3 diet (mechanical soft) with nectar thick liquids  Medication:   Outpatient Encounter Prescriptions as of 06/01/2015  Medication Sig  . buPROPion (WELLBUTRIN XL) 150 MG 24 hr tablet Take 3 tablets (450 mg total) by mouth daily.  . carbidopa-levodopa (SINEMET IR) 25-100 MG per tablet Take by mouth. Take 2 tablets by mouth in the morning, 1 tablets in the afternoon and take 1 tablet by mouth in the evening.  . cholecalciferol (VITAMIN D) 1000 UNITS tablet Take 1,000 Units by mouth daily.  . hydrochlorothiazide (HYDRODIURIL) 25 MG tablet TAKE 1 TABLET DAILY  . pravastatin (PRAVACHOL) 20 MG tablet TAKE 1 TABLET EVERY EVENING   No facility-administered encounter medications on file as of 06/01/2015.     Allergies:  Klonopin and Morphine and related  Review of Systems: Gen:  Denies  fever, sweats. HEENT: Denies blurred vision. Cvc:  No dizziness, chest pain or heaviness Resp:   Denies cough or sputum porduction. Gi: Denies swallowing difficulty,  Gu:  Denies bladder incontinence, burning urine Ext:   No Joint pain, stiffness. Skin: No skin  rash, easy bruising. Endoc:  No polyuria, polydipsia. Psych: No depression, insomnia. Other:  All other systems were reviewed and found to be negative other than what is mentioned in the HPI.   Physical Examination:   VS: BP 128/72 mmHg  Pulse 59  Ht 5\' 9"  (1.753 m)  Wt 189 lb 3.2 oz (85.821 kg)  BMI 27.93 kg/m2  SpO2 96%  General Appearance: No distress  Neuro:without  focal findings,  speech normal,  HEENT: PERRLA, EOM intact. Pulmonary: normal breath sounds, No wheezing.   CardiovascularNormal S1,S2.  No m/r/g.   Abdomen: Benign, Soft, non-tender. Renal:  No costovertebral tenderness  GU:  Not performed at this time. Endoc: No evident thyromegaly, no signs of acromegaly. Skin:   warm, no rash. Extremities: normal, no cyanosis, clubbing.   LABORATORY PANEL:   CBC No results for input(s): WBC, HGB, HCT, PLT in the last 168 hours. ------------------------------------------------------------------------------------------------------------------  Chemistries  No results for input(s): NA, K, CL, CO2, GLUCOSE, BUN, CREATININE, CALCIUM, MG, AST, ALT, ALKPHOS, BILITOT in the last 168 hours.  Invalid input(s): GFRCGP ------------------------------------------------------------------------------------------------------------------  Cardiac Enzymes No results for input(s): TROPONINI in the last 168 hours. ------------------------------------------------------------  RADIOLOGY:   No results found for this or any previous visit. Results for orders placed during the hospital encounter of 12/06/13  DG Chest 2 View   Narrative CLINICAL DATA:  Preop brain surgery.  History of hypertension.  EXAM: CHEST  2 VIEW  COMPARISON:  None.  FINDINGS: Generous heart size without definite cardiomegaly. Negative aortic contours for age. There is no edema, consolidation, effusion, or pneumothorax.  IMPRESSION: No active cardiopulmonary disease.   Electronically Signed   By: Jorje Guild M.D.   On: 12/06/2013 06:38    ------------------------------------------------------------------------------------------------------------------  Thank  you for allowing [facility] to assist in the care of your patient. Our recommendations are noted above.  Please contact us if we can be of further service.   Marda Stalker, MD.    [facility] Office Number: [phone]  Patricia Pesa, M.D.  Vilinda Boehringer, M.D.  Merton Border, M.D

## 2015-06-03 ENCOUNTER — Encounter: Payer: Self-pay | Admitting: Family Medicine

## 2015-06-03 ENCOUNTER — Ambulatory Visit (INDEPENDENT_AMBULATORY_CARE_PROVIDER_SITE_OTHER): Payer: Medicare PPO | Admitting: Family Medicine

## 2015-06-03 VITALS — BP 110/70 | HR 60 | Temp 98.9°F | Wt 184.5 lb

## 2015-06-03 DIAGNOSIS — R05 Cough: Secondary | ICD-10-CM

## 2015-06-03 DIAGNOSIS — J0100 Acute maxillary sinusitis, unspecified: Secondary | ICD-10-CM

## 2015-06-03 DIAGNOSIS — R059 Cough, unspecified: Secondary | ICD-10-CM

## 2015-06-03 LAB — POCT INFLUENZA A/B
Influenza A, POC: NEGATIVE
Influenza B, POC: NEGATIVE

## 2015-06-03 MED ORDER — AMOXICILLIN-POT CLAVULANATE 875-125 MG PO TABS: 1.0000 | ORAL_TABLET | Freq: Two times a day (BID) | ORAL | Status: DC

## 2015-06-03 MED ORDER — HYDROCODONE-HOMATROPINE 5-1.5 MG/5ML PO SYRP: 5.0000 mL | ORAL_SOLUTION | Freq: Three times a day (TID) | ORAL | Status: DC | PRN

## 2015-06-03 MED ORDER — BENZONATATE 200 MG PO CAPS: 200.0000 mg | ORAL_CAPSULE | Freq: Three times a day (TID) | ORAL | Status: DC | PRN

## 2015-06-03 NOTE — Patient Instructions (Signed)
Start augmentin.  Tessalon for the cough.  If not better then use hycodan for the cough but don't take it with oxycodone.  Take care. Glad to see you.  Update me as needed.

## 2015-06-03 NOTE — Progress Notes (Signed)
Pre visit review using our clinic review tool, if applicable. No additional management support is needed unless otherwise documented below in the visit note.  Sx started about 3 days ago.  Noted cough, worse in the meantime.  Chest has felt heavy.  Does have some sputum- unknown if discolored.  No fevers.  Chest is sore.  No leg aches.  No vomiting, no diarrhea.  Rhinorrhea, but no ear pain.    He can tolerate hydrocodone.  D/w pt.    Recent CXR with no active cardiopulmonary disease.  Meds, vitals, and allergies reviewed.   ROS: See HPI.  Otherwise, noncontributory.  GEN: nad, alert and oriented HEENT: mucous membranes moist, tm w/o erythema, nasal exam w/o erythema, clear discharge noted,  OP with cobblestoning, r max sinus ttp.  NECK: supple w/o LA CV: rrr.   PULM: ctab, no inc wob EXT: no edema SKIN: no acute rash No tremor appreciated on inspection.  Speech at baseline.

## 2015-06-03 NOTE — Assessment & Plan Note (Addendum)
Nontoxic, would treat given his other conditions.  D/w pt.  He agrees.  Can tolerate hydrocodone.  Will use if tessalon doesn't help, but not with oxycodone.  Start augmentin.  F/u prn. He agrees.  Flu neg.  D/w pt at [facility].

## 2015-06-08 ENCOUNTER — Telehealth: Payer: Self-pay | Admitting: Internal Medicine

## 2015-06-08 NOTE — Telephone Encounter (Signed)
Pt wife calling stating pt saw Dr Ashby Dawes last week  And they talked about putting patient on some new medication that would help him stay awake but nothing was called it Once we have decided what to give him please send it to [facility] [place]  Please advise

## 2015-06-08 NOTE — Telephone Encounter (Signed)
Spoke with wife, she is asking if you are going to send in the Riatiln for the patient that you spoke of at the visit. OV states once you looked at download report you would decide on medication. Please advise.

## 2015-06-10 NOTE — Telephone Encounter (Signed)
Will need to start methyphenidate SR 20 mg once every morning. Will be by Friday afternoon to sign script.

## 2015-06-11 MED ORDER — METHYLPHENIDATE HCL ER 20 MG PO TBCR: 20.0000 mg | EXTENDED_RELEASE_TABLET | Freq: Every day | ORAL | Status: DC

## 2015-06-11 NOTE — Telephone Encounter (Signed)
Spoke with wife and informed her DR is prescribing Ritalin Qam. Informed pt when rx is signed I will call to pick up. Nothing further needed.

## 2015-06-11 NOTE — Telephone Encounter (Signed)
LMOM for pt to return call. 

## 2015-06-15 DIAGNOSIS — G4733 Obstructive sleep apnea (adult) (pediatric): Secondary | ICD-10-CM | POA: Diagnosis not present

## 2015-06-18 ENCOUNTER — Ambulatory Visit (INDEPENDENT_AMBULATORY_CARE_PROVIDER_SITE_OTHER): Payer: Medicare PPO | Admitting: Psychology

## 2015-06-18 DIAGNOSIS — F321 Major depressive disorder, single episode, moderate: Secondary | ICD-10-CM

## 2015-07-01 ENCOUNTER — Other Ambulatory Visit: Payer: Self-pay | Admitting: Family Medicine

## 2015-07-01 NOTE — Telephone Encounter (Signed)
Received refill request electronically Last refill 01/22/15 #270/1 Last office visit 06/03/15/acute visit Is it okay to refill?

## 2015-07-01 NOTE — Telephone Encounter (Signed)
Sent. Thanks.   

## 2015-07-12 ENCOUNTER — Other Ambulatory Visit: Payer: Self-pay | Admitting: Family Medicine

## 2015-07-12 DIAGNOSIS — E785 Hyperlipidemia, unspecified: Secondary | ICD-10-CM

## 2015-07-17 ENCOUNTER — Other Ambulatory Visit (INDEPENDENT_AMBULATORY_CARE_PROVIDER_SITE_OTHER): Payer: Medicare PPO

## 2015-07-17 DIAGNOSIS — E785 Hyperlipidemia, unspecified: Secondary | ICD-10-CM

## 2015-07-17 LAB — LIPID PANEL
CHOLESTEROL: 150 mg/dL (ref 0–200)
HDL: 33.1 mg/dL — ABNORMAL LOW (ref >39.00)
LDL CALC: 90 mg/dL (ref 0–99)
NONHDL: 116.99
Total CHOL/HDL Ratio: 5
Triglycerides: 136 mg/dL (ref 0.0–149.0)
VLDL: 27.2 mg/dL (ref 0.0–40.0)

## 2015-07-17 LAB — COMPREHENSIVE METABOLIC PANEL
ALT: 15 U/L (ref 0–53)
AST: 16 U/L (ref 0–37)
Albumin: 4.1 g/dL (ref 3.5–5.2)
Alkaline Phosphatase: 68 U/L (ref 39–117)
BUN: 19 mg/dL (ref 6–23)
CHLORIDE: 104 meq/L (ref 96–112)
CO2: 32 meq/L (ref 19–32)
Calcium: 9.4 mg/dL (ref 8.4–10.5)
Creatinine, Ser: 1.1 mg/dL (ref 0.40–1.50)
GFR: 71.16 mL/min (ref >60.00)
GLUCOSE: 109 mg/dL — AB (ref 70–99)
POTASSIUM: 4 meq/L (ref 3.5–5.1)
SODIUM: 141 meq/L (ref 135–145)
Total Bilirubin: 0.6 mg/dL (ref 0.2–1.2)
Total Protein: 7 g/dL (ref 6.0–8.3)

## 2015-07-21 ENCOUNTER — Other Ambulatory Visit: Payer: Self-pay | Admitting: Family Medicine

## 2015-07-21 ENCOUNTER — Ambulatory Visit (INDEPENDENT_AMBULATORY_CARE_PROVIDER_SITE_OTHER): Payer: Medicare PPO | Admitting: Family Medicine

## 2015-07-21 ENCOUNTER — Ambulatory Visit (INDEPENDENT_AMBULATORY_CARE_PROVIDER_SITE_OTHER): Payer: Medicare PPO

## 2015-07-21 ENCOUNTER — Encounter: Payer: Self-pay | Admitting: Family Medicine

## 2015-07-21 VITALS — BP 102/68 | HR 68 | Temp 98.0°F | Ht 69.0 in | Wt 184.0 lb

## 2015-07-21 DIAGNOSIS — J0100 Acute maxillary sinusitis, unspecified: Secondary | ICD-10-CM | POA: Diagnosis not present

## 2015-07-21 DIAGNOSIS — F329 Major depressive disorder, single episode, unspecified: Secondary | ICD-10-CM | POA: Diagnosis not present

## 2015-07-21 DIAGNOSIS — G20 Parkinson's disease: Secondary | ICD-10-CM | POA: Diagnosis not present

## 2015-07-21 DIAGNOSIS — Z0000 Encounter for general adult medical examination without abnormal findings: Secondary | ICD-10-CM

## 2015-07-21 DIAGNOSIS — R5383 Other fatigue: Secondary | ICD-10-CM | POA: Diagnosis not present

## 2015-07-21 DIAGNOSIS — G20A1 Parkinson's disease without dyskinesia, without mention of fluctuations: Secondary | ICD-10-CM

## 2015-07-21 DIAGNOSIS — F32A Depression, unspecified: Secondary | ICD-10-CM

## 2015-07-21 DIAGNOSIS — Z119 Encounter for screening for infectious and parasitic diseases, unspecified: Secondary | ICD-10-CM

## 2015-07-21 DIAGNOSIS — Z1211 Encounter for screening for malignant neoplasm of colon: Secondary | ICD-10-CM

## 2015-07-21 NOTE — Progress Notes (Signed)
Pre visit review using our clinic review tool, if applicable. No additional management support is needed unless otherwise documented below in the visit note.  D/w patient [id number] for colon cancer screening, including IFOB vs. colonoscopy.  Risks and benefits of both were discussed and patient voiced understanding.  Pt elects [id number].    D/w pt about shingle and tetanus shots.  See AVS.   Pt opts in for HCV screening.  D/w pt re: routine screening.    Fatigue noted.  Unclear if from anemia/hypothyroidism or from depression, d/w pt and TSH/cbc pending.  His mood has been flat.  He is not able to enjoy much now.  Prev stimulant tx didn't help his mood.  Still on wellbutrin.  He is still seeing T Bauert for counseling.  He doesn't have SI/HI.   Parkinsons disease.  Still with fall risk, using a cane/walker but not steady with the cane.  He can't use his regular walker in his house, due to the size of the walker.  He would be able to ambulate better/more safely with a smaller 3 wheel walker, rx done for such and given to patient at [facility] for him to check with DME supplier.  Parking form also done for patient, given his PD.   PMH and SH reviewed  ROS: See HPI, otherwise noncontributory.  Meds, vitals, and allergies reviewed.   GEN: nad, alert and oriented, flat affect.  HEENT: mucous membranes moist NECK: supple w/o LA CV: rrr.  PULM: ctab, no inc wob ABD: soft, +bs EXT: no edema SKIN: no acute rash Slow gait, short strides, using cane.

## 2015-07-21 NOTE — Patient Instructions (Addendum)
[name] , Thank you for taking time to come for your Medicare Wellness Visit. I appreciate your ongoing commitment to your health goals. Please review the following plan we discussed and let me know if I can assist you in the future.    This is a list of the screening recommended for you and due dates:  Health Maintenance  Topic Date Due  . Colon Cancer Screening  07/20/2016*  . Shingles Vaccine  07/20/2016*  . Tetanus Vaccine  07/20/2016*  .  Hepatitis C: One time screening is recommended by Center for Disease Control  (CDC) for  adults born from [birth year] through 1966.   07/20/2016*  . Flu Shot  11/17/2015  . Pneumonia vaccines (2 of 2 - PPSV23) 01/05/2016  *Topic was postponed. The date shown is not the original due date.   Preventive Care for Adults  A healthy lifestyle and preventive care can promote health and wellness. Preventive health guidelines for adults include the following key practices.  . A routine yearly physical is a good way to check with your health care provider about your health and preventive screening. It is a chance to share any concerns and updates on your health and to receive a thorough exam.  . Visit your dentist for a routine exam and preventive care every 6 months. Brush your teeth twice a day and floss once a day. Good oral hygiene prevents tooth decay and gum disease.  . The frequency of eye exams is based on your age, health, family medical history, use  of contact lenses, and other factors. Follow your health care provider's ecommendations for frequency of eye exams.  . Eat a healthy diet. Foods like vegetables, fruits, whole grains, low-fat dairy products, and lean protein foods contain the nutrients you need without too many calories. Decrease your intake of foods high in solid fats, added sugars, and salt. Eat the right amount of calories for you. Get information about a proper diet from your health care provider, if necessary.  . Regular physical  exercise is one of the most important things you can do for your health. Most adults should get at least 150 minutes of moderate-intensity exercise (any activity that increases your heart rate and causes you to sweat) each week. In addition, most adults need muscle-strengthening exercises on 2 or more days a week.  Silver Sneakers may be a benefit available to you. To determine eligibility, you may visit the website: www.silversneakers.com or contact program at 417-825-4357 Mon-Fri between 8AM-8PM.   . Maintain a healthy weight. The body mass index (BMI) is a screening tool to identify possible weight problems. It provides an estimate of body fat based on height and weight. Your health care provider can find your BMI and can help you achieve or maintain a healthy weight.   For adults 20 years and older: ? A BMI below 18.5 is considered underweight. ? A BMI of 18.5 to 24.9 is normal. ? A BMI of 25 to 29.9 is considered overweight. ? A BMI of 30 and above is considered obese.   . Maintain normal blood lipids and cholesterol levels by exercising and minimizing your intake of saturated fat. Eat a balanced diet with plenty of fruit and vegetables. Blood tests for lipids and cholesterol should begin at age 51 and be repeated every 5 years. If your lipid or cholesterol levels are high, you are over 50, or you are at high risk for heart disease, you may need your cholesterol levels checked more  frequently. Ongoing high lipid and cholesterol levels should be treated with medicines if diet and exercise are not working.  . If you smoke, find out from your health care provider how to quit. If you do not use tobacco, please do not start.  . If you choose to drink alcohol, please do not consume more than 2 drinks per day. One drink is considered to be 12 ounces (355 mL) of beer, 5 ounces (148 mL) of wine, or 1.5 ounces (44 mL) of liquor.  . If you are 10-63 years old, ask your health care provider if you  should take aspirin to prevent strokes.  . Use sunscreen. Apply sunscreen liberally and repeatedly throughout the day. You should seek shade when your shadow is shorter than you. Protect yourself by wearing long sleeves, pants, a wide-brimmed hat, and sunglasses year round, whenever you are outdoors.  . Once a month, do a whole body skin exam, using a mirror to look at the skin on your back. Tell your health care provider of new moles, moles that have irregular borders, moles that are larger than a pencil eraser, or moles that have changed in shape or color.     Fall Prevention in the Home  Falls can cause injuries. They can happen to people of all ages. There are many things you can do to make your home safe and to help prevent falls.  WHAT CAN I DO ON THE OUTSIDE OF MY HOME?  Regularly fix the edges of walkways and driveways and fix any cracks.  Remove anything that might make you trip as you walk through a door, such as a raised step or threshold.  Trim any bushes or trees on the path to your home.  Use bright outdoor lighting.  Clear any walking paths of anything that might make someone trip, such as rocks or tools.  Regularly check to see if handrails are loose or broken. Make sure that both sides of any steps have handrails.  Any raised decks and porches should have guardrails on the edges.  Have any leaves, snow, or ice cleared regularly.  Use sand or salt on walking paths during winter.  Clean up any spills in your garage right away. This includes oil or grease spills. WHAT CAN I DO IN THE BATHROOM?   Use night lights.  Install grab bars by the toilet and in the tub and shower. Do not use towel bars as grab bars.  Use non-skid mats or decals in the tub or shower.  If you need to sit down in the shower, use a plastic, non-slip stool.  Keep the floor dry. Clean up any water that spills on the floor as soon as it happens.  Remove soap buildup in the tub or shower  regularly.  Attach bath mats securely with double-sided non-slip rug tape.  Do not have throw rugs and other things on the floor that can make you trip. WHAT CAN I DO IN THE BEDROOM?  Use night lights.  Make sure that you have a light by your bed that is easy to reach.  Do not use any sheets or blankets that are too big for your bed. They should not hang down onto the floor.  Have a firm chair that has side arms. You can use this for support while you get dressed.  Do not have throw rugs and other things on the floor that can make you trip. WHAT CAN I DO IN THE KITCHEN?  Clean up  any spills right away.  Avoid walking on wet floors.  Keep items that you use a lot in easy-to-reach places.  If you need to reach something above you, use a strong step stool that has a grab bar.  Keep electrical cords out of the way.  Do not use floor polish or wax that makes floors slippery. If you must use wax, use non-skid floor wax.  Do not have throw rugs and other things on the floor that can make you trip. WHAT CAN I DO WITH MY STAIRS?  Do not leave any items on the stairs.  Make sure that there are handrails on both sides of the stairs and use them. Fix handrails that are broken or loose. Make sure that handrails are as long as the stairways.  Check any carpeting to make sure that it is firmly attached to the stairs. Fix any carpet that is loose or worn.  Avoid having throw rugs at the top or bottom of the stairs. If you do have throw rugs, attach them to the floor with carpet tape.  Make sure that you have a light switch at the top of the stairs and the bottom of the stairs. If you do not have them, ask someone to add them for you. WHAT ELSE CAN I DO TO HELP PREVENT FALLS?  Wear shoes that:  Do not have high heels.  Have rubber bottoms.  Are comfortable and fit you well.  Are closed at the toe. Do not wear sandals.  If you use a stepladder:  Make sure that it is fully  opened. Do not climb a closed stepladder.  Make sure that both sides of the stepladder are locked into place.  Ask someone to hold it for you, if possible.  Clearly mark and make sure that you can see:  Any grab bars or handrails.  First and last steps.  Where the edge of each step is.  Use tools that help you move around (mobility aids) if they are needed. These include:  Canes.  Walkers.  Scooters.  Crutches.  Turn on the lights when you go into a dark area. Replace any light bulbs as soon as they burn out.  Set up your furniture so you have a clear path. Avoid moving your furniture around.  If any of your floors are uneven, fix them.  If there are any pets around you, be aware of where they are.  Review your medicines with your doctor. Some medicines can make you feel dizzy. This can increase your chance of falling. Ask your doctor what other things that you can do to help prevent falls.   This information is not intended to replace advice given to you by your health care provider. Make sure you discuss any questions you have with your health care provider.   Document Released: 01/29/2009 Document Revised: 08/19/2014 Document Reviewed: 05/09/2014 Elsevier Interactive Patient Education [employer].

## 2015-07-21 NOTE — Patient Instructions (Addendum)
Check with your insurance to see if they will cover the shingles and tetanus shots.  Go to the lab on the way out.  We'll contact you with your lab report. I'll check on med options in the meantime.  Take care.  Glad to see you.

## 2015-07-21 NOTE — Progress Notes (Signed)
Subjective:   [name] is a 65 y.o. male who presents for Medicare Annual/Subsequent preventive examination.  Cardiac Risk Factors include: advanced age (>79men, >70 women);hypertension;dyslipidemia;male gender     Objective:    Vitals: BP 102/68 mmHg  Pulse 68  Temp(Src) 98 F (36.7 C) (Oral)  Ht 5\' 9"  (1.753 m)  Wt 184 lb (83.462 kg)  BMI 27.16 kg/m2  SpO2 96%  Body mass index is 27.16 kg/(m^2).  Tobacco History  Smoking status  . Never Smoker   Smokeless tobacco  . Former Engineer, structural given: No   Past Medical History  Diagnosis Date  . Parkinson's disease ([place])     dx'ed 15 years ago  . PTSD (post-traumatic stress disorder)   . Bradycardia   . Hypertension     treated with HCTZ  . Sleep apnea     doesn't use C-pap  . Varicose veins   . Cancer [facility]) 2013    skin cancer  . Arthritis   . History of chicken pox   . GERD (gastroesophageal reflux disease)   . Dysrhythmia     chronic slow heart rate  . Depression     ptsd  . History of kidney stones   . Headache(784.0)     tension headaches non recent  . Shortness of breath dyspnea    Past Surgical History  Procedure Laterality Date  . Cyst removed       from lip as a child  . Skin cancer removed      from ears,   12 lft arm  rt leg 14  . Lumbar laminectomy/decompression microdiscectomy Bilateral 12/14/2012    Procedure: Bilateral lumbar three-four, four-five decompressive laminotomy/foraminotomy;  Surgeon: Charlie Pitter, MD;  Location: [place] NEURO ORS;  Service: Neurosurgery;  Laterality: Bilateral;  . Subthalamic stimulator insertion Bilateral 12/06/2013    Procedure: SUBTHALAMIC STIMULATOR INSERTION;  Surgeon: Erline Levine, MD;  Location: [place] NEURO ORS;  Service: Neurosurgery;  Laterality: Bilateral;  Bilateral deep brain stimulator placement  . Pulse generator implant Bilateral 12/13/2013    Procedure: Bilateral implantable pulse generator placement;  Surgeon: Erline Levine, MD;  Location: [place]  NEURO ORS;  Service: Neurosurgery;  Laterality: Bilateral;  Bilateral implantable pulse generator placement  . Cholecystectomy N/A 10/22/2014    Procedure: LAPAROSCOPIC CHOLECYSTECTOMY WITH INTRAOPERATIVE CHOLANGIOGRAM;  Surgeon: Dia Crawford III, MD;  Location: [facility] ORS;  Service: General;  Laterality: N/A;   Family History  Problem Relation Age of Onset  . Colon cancer Neg Hx   . Prostate cancer Neg Hx   . Lung cancer Father    History  Sexual Activity  . Sexual Activity: No    Outpatient Encounter Prescriptions as of 07/21/2015  Medication Sig  . buPROPion (WELLBUTRIN XL) 150 MG 24 hr tablet TAKE 3 TABLETS DAILY  . carbidopa-levodopa (SINEMET IR) 25-100 MG per tablet Take by mouth. Take 2 tablets by mouth in the morning, 2 tablets in the afternoon and take 2 tablet by mouth in the evening.  . cholecalciferol (VITAMIN D) 1000 UNITS tablet Take 1,000 Units by mouth daily.  . hydrochlorothiazide (HYDRODIURIL) 25 MG tablet TAKE 1 TABLET DAILY  . oxyCODONE-acetaminophen (PERCOCET/ROXICET) 5-325 MG tablet Take by mouth every 6 (six) hours as needed for severe pain.  . pravastatin (PRAVACHOL) 20 MG tablet TAKE 1 TABLET EVERY EVENING  . [DISCONTINUED] amoxicillin-clavulanate (AUGMENTIN) 875-125 MG tablet Take 1 tablet by mouth 2 (two) times daily.  . [DISCONTINUED] benzonatate (TESSALON) 200 MG capsule Take  1 capsule (200 mg total) by mouth 3 (three) times daily as needed.  . [DISCONTINUED] HYDROcodone-homatropine (HYCODAN) 5-1.5 MG/5ML syrup Take 5 mLs by mouth every 8 (eight) hours as needed for cough.  . [DISCONTINUED] methylphenidate (METADATE ER) 20 MG ER tablet Take 1 tablet (20 mg total) by mouth daily.  . [DISCONTINUED] Phenylephrine-Chlorphen-DM (ROBITUSSIN COUGH/ALLERGY PO) Take by mouth as needed.   No facility-administered encounter medications on file as of 07/21/2015.    Activities of Daily Living In your present state of health, do you have any difficulty performing the following  activities: 07/21/2015 10/21/2014  Hearing? Y N  Vision? N N  Difficulty concentrating or making decisions? Y N  Walking or climbing stairs? Y Y  Dressing or bathing? Y N  Doing errands, shopping? Tempie Donning  Preparing Food and eating ? Y -  Using the Toilet? N -  In the past six months, have you accidently leaked urine? N -  Do you have problems with loss of bowel control? N -  Managing your Medications? Y -  Managing your Finances? N -  Housekeeping or managing your Housekeeping? Y -    Patient Care Team: Tonia Ghent, MD as PCP - General (Family Medicine) Barrie Folk, LCSW as Social Worker (Licensed Clinical Social Worker) Laverle Hobby, MD as Consulting Physician (Pulmonary Disease) Ludwig Clarks, DO as Consulting Physician (Neurology) Minna Merritts, MD as Consulting Physician (Cardiology) Earnie Larsson, MD as Consulting Physician (Neurosurgery) Jannet Mantis, MD as Consulting Physician (Dermatology)   Assessment:    Vision Screening Comments: Last eye exam with [facility] in June 2016  Exercise Activities and Dietary recommendations Current Exercise Habits: The patient does not participate in regular exercise at present, Exercise limited by: neurologic condition(s)  Fall Risk Fall Risk  07/21/2015 07/21/2015 03/12/2013  Falls in the past year? Yes Yes Yes  Number falls in past yr: 2 or more 2 or more 2 or more  Injury with Fall? Yes Yes -  Risk Factor Category  High Fall Risk High Fall Risk -  Risk for fall due to : History of fall(s);Impaired balance/gait;Impaired mobility History of fall(s);Impaired balance/gait;Impaired mobility Impaired balance/gait;History of fall(s)  Follow up Falls evaluation completed;Education provided;Falls prevention discussed Falls evaluation completed;Education provided;Falls prevention discussed -   Depression Screen PHQ 2/9 Scores 07/21/2015 07/21/2015  PHQ - 2 Score 3 3  PHQ- 9 Score 10 10    Cognitive Testing MMSE - Mini  Mental State Exam 07/21/2015  Orientation to time 5  Orientation to Place 5  Registration 3  Attention/ Calculation 0  Recall 3  Language- name 2 objects 0  Language- repeat 1  Language- follow 3 step command 3  Language- read & follow direction 0  Write a sentence 0  Copy design 0  Total score 20   PLEASE NOTE: A Mini-Cog screen was completed. Maximum score is 20. A value of 0 denotes this part of [place] MMSE was not completed.  Orientation to Time - Max 5 Orientation to Place - Max 5 Registration - Max 3 Recall - Max 3 Language Repeat - Max 1 Language Follow 3 Step Command - Max 3  Immunization History  Administered Date(s) Administered  . Influenza,inj,Quad PF,36+ Mos 01/17/2014, 01/05/2015  . Pneumococcal Conjugate-13 01/05/2015   Screening Tests Health Maintenance  Topic Date Due  . COLONOSCOPY - will discuss with PCP 07/20/2016 (Originally 06/05/1999)  . ZOSTAVAX - will discuss with PCP 07/20/2016 (Originally 06/04/2009)  . TETANUS/TDAP -  will discuss with PCP  07/20/2016 (Originally 06/04/1969)  . Hepatitis C Screening - will discuss with PCP  07/20/2016 (Originally December 13, 1950)  . INFLUENZA VACCINE  11/17/2015  . PNA vac Low Risk Adult (2 of 2 - PPSV23) 01/05/2016      Plan:      I have personally reviewed and addressed the Medicare Annual Wellness questionnaire and have noted the following in the patient's chart:  A. Medical and social history B. Use of alcohol, tobacco or illicit drugs  C. Current medications and supplements D. Functional ability and status E.  Nutritional status F.  Physical activity G. Advance directives H. List of other physicians I.  Hospitalizations, surgeries, and ER visits in previous 12 months J.  [place] to include hearing, vision, cognitive, depression L. Referrals and appointments - none  In addition, I have reviewed and discussed with patient certain preventive protocols, quality metrics, and best practice  recommendations. A written personalized care plan for preventive services as well as general preventive health recommendations were provided to patient.  See attached scanned questionnaire for additional information.   Signed,   Lindell Noe, MHA, BS, LPN Health Advisor [phone]  I reviewed health advisor's note, was available for consultation on the day of service listed in this note, and agree with documentation and plan. Elsie Stain, MD.

## 2015-07-21 NOTE — Progress Notes (Signed)
Pre visit review using our clinic review tool, if applicable. No additional management support is needed unless otherwise documented below in the visit note. 

## 2015-07-22 DIAGNOSIS — G20 Parkinson's disease: Secondary | ICD-10-CM | POA: Diagnosis not present

## 2015-07-22 DIAGNOSIS — I10 Essential (primary) hypertension: Secondary | ICD-10-CM | POA: Diagnosis not present

## 2015-07-22 DIAGNOSIS — M545 Low back pain: Secondary | ICD-10-CM | POA: Diagnosis not present

## 2015-07-22 DIAGNOSIS — E785 Hyperlipidemia, unspecified: Secondary | ICD-10-CM | POA: Diagnosis not present

## 2015-07-22 DIAGNOSIS — F330 Major depressive disorder, recurrent, mild: Secondary | ICD-10-CM | POA: Diagnosis not present

## 2015-07-22 DIAGNOSIS — E559 Vitamin D deficiency, unspecified: Secondary | ICD-10-CM | POA: Diagnosis not present

## 2015-07-22 DIAGNOSIS — Z6827 Body mass index (BMI) 27.0-27.9, adult: Secondary | ICD-10-CM | POA: Diagnosis not present

## 2015-07-22 LAB — CBC WITH DIFFERENTIAL/PLATELET
BASOS ABS: 0 10*3/uL (ref 0.0–0.1)
Basophils Relative: 0.5 % (ref 0.0–3.0)
EOS ABS: 0.1 10*3/uL (ref 0.0–0.7)
Eosinophils Relative: 1.2 % (ref 0.0–5.0)
HEMATOCRIT: 42.5 % (ref 39.0–52.0)
Hemoglobin: 14.4 g/dL (ref 13.0–17.0)
LYMPHS PCT: 20.8 % (ref 12.0–46.0)
Lymphs Abs: 1.9 10*3/uL (ref 0.7–4.0)
MCHC: 34 g/dL (ref 30.0–36.0)
MCV: 90.1 fl (ref 78.0–100.0)
MONOS PCT: 6.7 % (ref 3.0–12.0)
Monocytes Absolute: 0.6 10*3/uL (ref 0.1–1.0)
NEUTROS ABS: 6.3 10*3/uL (ref 1.4–7.7)
Neutrophils Relative %: 70.8 % (ref 43.0–77.0)
PLATELETS: 203 10*3/uL (ref 150.0–400.0)
RBC: 4.72 Mil/uL (ref 4.22–5.81)
RDW: 13.6 % (ref 11.5–15.5)
WBC: 8.9 10*3/uL (ref 4.0–10.5)

## 2015-07-22 LAB — HEPATITIS C ANTIBODY: HCV Ab: NEGATIVE

## 2015-07-22 LAB — TSH: TSH: 0.56 u[IU]/mL (ref 0.35–4.50)

## 2015-07-23 ENCOUNTER — Ambulatory Visit (INDEPENDENT_AMBULATORY_CARE_PROVIDER_SITE_OTHER): Payer: Medicare PPO | Admitting: Psychology

## 2015-07-23 ENCOUNTER — Encounter: Payer: Self-pay | Admitting: *Deleted

## 2015-07-23 DIAGNOSIS — Z0000 Encounter for general adult medical examination without abnormal findings: Secondary | ICD-10-CM | POA: Insufficient documentation

## 2015-07-23 DIAGNOSIS — F321 Major depressive disorder, single episode, moderate: Secondary | ICD-10-CM

## 2015-07-23 DIAGNOSIS — Z1211 Encounter for screening for malignant neoplasm of colon: Secondary | ICD-10-CM | POA: Insufficient documentation

## 2015-07-23 NOTE — Assessment & Plan Note (Signed)
With fall risk due to gait abnormality.  rx done for 3 wheel walker and parking form.  >25 minutes spent in face to face time with patient, >50% spent in counselling or coordination of care.

## 2015-07-23 NOTE — Assessment & Plan Note (Signed)
HCV pending.  

## 2015-07-23 NOTE — Assessment & Plan Note (Addendum)
I'd like to consider options re: SSRI in addition to current meds.  I will d/w neurology.  Check cbc and TSH in meantime.

## 2015-07-23 NOTE — Assessment & Plan Note (Signed)
IFOB pending.  

## 2015-07-24 ENCOUNTER — Telehealth: Payer: Self-pay | Admitting: Family Medicine

## 2015-07-24 ENCOUNTER — Ambulatory Visit (INDEPENDENT_AMBULATORY_CARE_PROVIDER_SITE_OTHER): Payer: Medicare PPO | Admitting: Neurology

## 2015-07-24 ENCOUNTER — Encounter: Payer: Self-pay | Admitting: Neurology

## 2015-07-24 VITALS — BP 118/68 | HR 46 | Ht 64.0 in | Wt 182.0 lb

## 2015-07-24 DIAGNOSIS — Z9689 Presence of other specified functional implants: Secondary | ICD-10-CM

## 2015-07-24 DIAGNOSIS — R1310 Dysphagia, unspecified: Secondary | ICD-10-CM | POA: Diagnosis not present

## 2015-07-24 DIAGNOSIS — G20 Parkinson's disease: Secondary | ICD-10-CM | POA: Diagnosis not present

## 2015-07-24 MED ORDER — ESCITALOPRAM OXALATE 10 MG PO TABS: 10.0000 mg | ORAL_TABLET | Freq: Every day | ORAL | Status: DC

## 2015-07-24 NOTE — Telephone Encounter (Signed)
Wife advised. 

## 2015-07-24 NOTE — Telephone Encounter (Signed)
Left detailed message on voicemail.  

## 2015-07-24 NOTE — Telephone Encounter (Signed)
Please call pt.  I checked with Dr. Carles Collet.  Would be reasonable to start lexapro 10mg  a day.   Rx sent.  It will take a few weeks for patient to notice a change in his mood.   Please update me in about 1 month, sooner if needed.  Thanks.

## 2015-07-24 NOTE — Telephone Encounter (Signed)
Please make sure patient knows to add on the lexapro with the wellbutrin (when the lexapro comes in from mail order pharmacy).  Don't stop the wellbutrin.  Thanks.

## 2015-07-24 NOTE — Procedures (Signed)
DBS Programming was performed.    Total time spent programming was 20 minutes.  Device was confirmed to be on.  Soft start was confirmed to be on at 8.  Impedences were checked and were within normal limits.  Battery was checked and was determined to be functioning normally and not near the end of life.   This was all documented on a separate neurophysiologic worksheet.  Final settings were as follows:  Left brain electrode:     1-C+           ; Amplitude  4.0   V   ; Pulse width 90 microseconds;   Frequency   130   Hz.  Right brain electrode:     2-C+          ; Amplitude   4.3  V ;  Pulse width 60  microseconds;  Frequency   140    Hz.   

## 2015-07-24 NOTE — Progress Notes (Signed)
[name] was seen today in the movement disorders clinic for neurologic consultation at the request of Dr. Annette Stable. His PCP is at the [facility] in [place].  The consultation is for the evaluation of Parkinson's disease.  The patient is accompanied by his wife who supplements the history.  The patient previously saw Dr. Jennelle Human at [facility] and I do have several of those records.  Patient was diagnosed with Parkinson's disease about 15 years ago, at the age of [age] years old.  He states that the first sx was not swinging of the R arm and some minor balance changes.  Balance has gotten worse with time.   The patient is currently on carbidopa/levodopa 25/100, 3 at 9am, 2 at 1, 3 at 5pm, 2 at 9 pm and 1 carbidopa/levodopa 25/100 CR at bedtime.  He is also on Mirapex 0.5 mg 3 times a day and selegiline 5 mg daily.  Because of dyskinesia, Dr. Nicki Reaper did talk to him about DBS, but according to Dr. Bary Leriche notes, the patient was skeptical.  He did have a neuropsych evaluation at the [facility] in [place], but it appears that this was more of counseling and no testing.  He did have a nocturnal polysomnogram on 06/20/2010 demonstrating an apnea hypopnea index of 16.  07/11/13 update:  Pt is f/u today re: PD.  This patient is accompanied in the office by his spouse who supplements the history.  He is on carbidopa/levodopa, 3 at 9am, 2 at 1, 3 at 5pm, 2 at 9 pm and takes carbidopa/levodopa CR 25/100  at bedtime.  This is a total daily levodopa dose of 1100 mg per day.  He is on mirapex 0.5 mg three times per day and selegeline 5 mg in the AM.  He states, "I am ready for DBS.  Tell me about what I need to do."  Pt c/o dyskinesias.  Falls on average once per week.  No hallucinations.  No n/v.  No syncope.  States that he saw himself singing at [place] and could not believe how much dyskinesia he had.  He states that when his medications don't work, he "hits a wall."  This is sometimes very unpredictable.  07/24/13:  Present with  wife for on/off test.  Actually went to PT late yesterday afternoon and was off of medication and had a very hard time.  While he has been off medication for 36 hours, his wife reports that she thinks that he has done remarkably well.  There has been minimal tremor and he really hasn't been dragging his leg much.  He expresses desire to proceed with DBS.  10/25/13 update:  Patient presents today with his wife, who supplements the history.  The patient is preop DBS.  He is scheduled for deep brain stimulation surgery on 12/06/2013.  He is scheduled for IPG placement on 12/13/2013.  Since our last visit, he did have neuropsychometric testing.  Results of this were favorable for DBS placement.  It was recommended that he faithfully wear his CPAP.  He just had his preop MRI of the brain done.  Patient is currently taking levodopa 25/100, on the following schedule: 3/2/3/2 and then an additional carbidopa/levodopa 25/100 CR at bedtime.  This is in addition to pramipexole 0.5 mg 3 times a day and selegiline 5 mg in the morning.  Overall doing well.  No hallucinations.  No falls.  Has had some more of his chronic back pain.  01/08/14 update:  The patient  presents today for followup.  He is accompanied by his wife who supplements the history.  The patient underwent bilateral STN DBS on 12/06/2013 with generator placement on 12/13/2013.  The patient's wife reports that his Parkinson's disease symptoms have actually been much better since surgery.  He did go back to taking his previous dose of medication, however.  He is on carbidopa/levodopa 25/100 on the following schedule: 3/2/3/2 and then he takes an additional carbidopa/levodopa 25/100 CR at night.  He is also on Mirapex 0.5 mg 3 times a day as well as selegiline 5 mg in the morning.  He had 2 falls since last visit.  On the one, he actually fell down a few stairs and states that he was actually able to roll onto the carpet.  He did not get hurt.  With the other one,  he states that he was walking on a flat surface and just did not expect there to be a step downward and there was.  Again, he did not get hurt.  Wife reports minor dyskinesia.  They have noted no tremor.  Rigidity has been better.  He has been very sleepy.  Wife thinks that this is because he only sleeps about 4 hours at night because he is trying to do so much during the day.  01/16/14 update:  Patient returns today, accompanied by his wife who supplements the history.  Overall, the patient has been doing well.  He remains very sleepy.  He is not sure if that is from medication.  He refuses to wear the CPAP.  He is supposed to be on a tablet of carbidopa/levodopa 25/100 per day but has dropped down to 6 on his own.  He is on pramipexole 0.5 mg twice per day.  He does not want to start speech therapy until November, because he is going out of town.  He had one fall on the stairs since last visit.  Reports that he has an old house with small steps.  He denies any dyskinesia.  He has had some swelling in the hands, right greater than left.  He has an appointment to discuss this with his primary care physician.  He denies any tremor.    01/22/14 update:  Patient returns today for followup, accompanied by his wife who supplements the history.  Patient called me almost immediately after last visit complaining about dyskinesia and he wanted me to turn his settings back down.  I had told him to instead to decrease his levodopa up to 2 tablets in the morning, one in the afternoon and one in the evening and we had already been weaning his Mirapex.  That stopped the dyskinesias which were in the R arm and he admits that some days he is only taking 3 levodopa per day.  He is sometimes having what sounds like myoclonic jerking in the R arm.  He also c/o "aching" in the joints all over the body "but it doesn't feel like the stiffness of parkinsons disease."  He still has swelling.  He did f/u with PCP but etiology is unknown.     02/17/14 update:  This patient is accompanied in the office by his spouse who supplements the history.  Has had several falls.  Almost fell into the fireplace.  [place] backwards going down the stairs.  No serious injuries but still multiple falls.  Generally occur on the stairs.  Wrecked his [place] as missed the brake.  No tremor but did turn off his device  and notes that tremor will immediately return.  Off the mirapex but still sleeping "all the time."  Wife states that no longer doing the things that he is to work to do.  He is less involved with the church, which he used to be very passionate about.  He is not exercising like he was.  He started speech therapy today.  He had a  Modified barium swallow on 02/05/2014 that demonstrated moderate pharyngeal phase dysphagia and recommended a dysphagia 3 diet (mechanical soft) with nectar thick liquids.  They just got the nectar thick-it today.  His wife thinks that perhaps he is depressed.  She thinks that maybe that is why he is sleeping all the time.the patient also asked me about a referral to orthopedics for a rotator cuff problem on the left.  He is having severe pain, but does not necessarily want surgery.  04/15/14 update:  The patient returns today for follow-up, accompanied by his wife who supplements the history.  He remains on carbidopa/levodopa 25/100, 1 tablet 4 times per day.  He was started on Wellbutrin last visit.  This has been worked up to 300 mg daily.  His mood is much better.  His motivation is much better.  He is sleeping much less.  His balance is not very good.  He has not yet started physical therapy, but plans to next week.  He has finished LSVT loud.  He notices some tremor of the left hand and tremor of the right face.  We did have him try to turn off the DBS to see what would happen with the tremor right face and he got much worse.  He has seen Dr. Maureen Ralphs and had cortisone injections into the right shoulder.  That has helped.  He had an  EMG that demonstrated chronic multilevel radiculopathy from L2-S1 bilaterally.  There was no evidence of peripheral neuropathy.  05/08/13 update:  This patient is accompanied in the office by his spouse who supplements the history.  Overall, the patient is doing well but he is contemplating back surgery.  He saw Dr. Trenton Gammon regarding this and is supposed to have a CT myelogram soon.  He presents today because he is having some tremor of the right face and arm and left arm tremor, especially when he is stressed.  He takes 2 levodopa in the morning, one in the afternoon and one in the evening.  He really does fairly well with that.  Tremor is worse in the evening.  No falls.  He is doing fairly well with Wellbutrin for his mood.  He has put physical therapy on hold.  07/03/14 update:  Patient is accompanied by his wife, who supplements the history.  He had a back surgery on 06/13/2014 by Dr. Trenton Gammon.  Records are reviewed from the surgery.  He had an L1-L2 laminectomy and reexplored the L4-L5 laminectomies with foraminotomies.  The patient was released from rehabilitation on March 11 and subsequently came to our Parkinson's educational event.  At that event, we talked about the fact that DBS can worsen speech and he thinks that perhaps the device has caused some speech difficulties.  His wife also thinks that his mood has gotten worse, especially since his most recent back surgery.  He is doing better in terms of the back pain.  He is weaning off of gabapentin.  Physical therapy is coming into his house.  He has noticed some left hand and right leg tremor.  07/28/14 update:  Pt is  accompanied by his wife, who supplements the history.  He is continuing to have EDS, which he has had long prior to DBS therapy.  He has OSAS but has not been able to tolerate CPAP therapy.  He has no cardiac disease.  We made significant changes to his programing last visit because of stuttering/speech issues and his wife states this is much  better.  He is going to resume ST because of choking issues and that is to restart tomorrow.  He is having some tremor in the L hand and right shoulder.  When asked about mood, his wife states that "he needs an attitude adjustment some days."  She does state that his driving seems much better after his gabapentin was discontinued.  He is not having any lightheadedness.  No hallucinations.  No confusion.  11/28/14 update:  The patient is following up today, accompanied by his wife who supplements the history.  He is on carbidopa/levodopa 25/100, 2 tablets in the morning, one in the afternoon and one in the evening but admits that he really doesn't take it this way and is lucky to get in 3 per day (hadn't taken any when I saw him today at 11:15 pm).  Last visit, I added Provigil because of excessive daytime hypersomnolence and we ultimately tried to increase it over the phone to twice a day dosing, but by June, the patient had discontinued it because he was doing better from this regard.  He called me on June 6, however complaining about acting out his dreams.  We added clonazepam, 0.5 mg, half a tablet at night.  Only a week later the patient had discontinued as he felt that it perhaps worsened his mood but he also thought that it caused diarrhea.  Not long after that, however, the patient ended up in the emergency room with abdominal pain and was diagnosed with a porcelain gallbladder and had surgery to have his gallbladder removed on 10/21/2014.  His wife states today, however, that the klonopin made him seriously depressed.  He did hallucinate with the morphine in the hospital.  His wife states that they saw someone they hadn't seen in a longtime and they commented how well he looked.  He has started PT several times and had to stop for various reasons.  He attended a support group in [place].    04/01/15 update:  The patient is following up today, accompanied by his wife who supplements the history.  The  patient has Parkinson's disease and is status post deep brain stimulator placement.  He remains on carbidopa/levodopa 25/100, 2 tablets in the morning, one in the afternoon and one in the evening but instead of taking the last dose in the evening he will take it at bedtime and instead of taking the one in the middle of the afternoon he will take that late evening.  Therefore, he will end up having tremor in the right hand late in the day.  Overall, he is doing well and "most days you don't know he has PD" according to his wife.  He has been going to physical therapy at [facility].  His wife states that insurance only covered 3 weeks of therapy and then cut him off and his wife is hoping to re-enroll him after the first of the year.    I have reviewed records since our last visit.  Dr. Damita Dunnings increased his Wellbutrin XL from 300 mg to 450 mg on 01/06/2015.  That helped a lot and he  is more like his old self.   The patient has also seen Dr. Ashby Dawes since our last visit.  He has a history of known sleep apnea and has been noncompliant with his CPAP.  Dr. Ashby Dawes recommended a re-titration study and the patient is awaiting that study.  Vivid dreams continue.    07/24/15 update:  The patient follows up today, accompanied by his wife who supplements the history.  I have also reviewed prior records made available to me.  The patient is status post deep brain stimulation to the bilateral STN in August, 2015.  He remains on carbidopa/levodopa 25/100, 2 tablets three times a day.  His biggest issue has been depression.  He is on Wellbutrin XL, 450 mg daily.  Just 2 days ago his primary care physician added Lexapro.  It was sent to the mail order pharmacy and he has not gotten it yet.  He thought he was supposed to stop his Wellbutrin and asked me about that today.  He is also under the care of a counselor.  He states that he saw her yesterday.  He is not suicidal or homicidal.  His primary care physician also gave him  a prescription for a new walker because he could not use his old walker in his house because of the size.  His wife does state that he has had more difficulty with choking and swallowing.  He really is noncompliant, however, with recommendations on his prior swallowing study.  He eats whatever he wants and prior swallowing study recommended nectar thick liquids.  He does not follow this.  He is not exercising.   Neuroimaging has  previously been performed.  It is not available for my review today.  PREVIOUS MEDICATIONS: Sinemet, Sinemet CR, Mirapex and Eldpryl; klonopin (depressed)  ALLERGIES:   Allergies  Allergen Reactions  . Klonopin [Clonazepam] Other (See Comments)    Worsening mood/depression  . Morphine And Related     hallucinations    CURRENT MEDICATIONS:    Medication List       This list is accurate as of: 07/24/15 11:06 AM.  Always use your most recent med list.               buPROPion 150 MG 24 hr tablet  Commonly known as:  WELLBUTRIN XL  TAKE 3 TABLETS DAILY     carbidopa-levodopa 25-100 MG tablet  Commonly known as:  SINEMET IR  Take by mouth. Take 2 tablets by mouth in the morning, 2 tablets in the afternoon and take 2 tablet by mouth in the evening.     cholecalciferol 1000 units tablet  Commonly known as:  VITAMIN D  Take 1,000 Units by mouth daily.     escitalopram 10 MG tablet  Commonly known as:  LEXAPRO  Take 1 tablet (10 mg total) by mouth daily.     hydrochlorothiazide 25 MG tablet  Commonly known as:  HYDRODIURIL  TAKE 1 TABLET DAILY     oxyCODONE-acetaminophen 5-325 MG tablet  Commonly known as:  PERCOCET/ROXICET  Take by mouth every 6 (six) hours as needed for severe pain.     pravastatin 20 MG tablet  Commonly known as:  PRAVACHOL  TAKE 1 TABLET EVERY EVENING         PAST MEDICAL HISTORY:   Past Medical History  Diagnosis Date  . Parkinson's disease ([place])     dx'ed 15 years ago  . PTSD (post-traumatic stress disorder)   .  Bradycardia   .  Hypertension     treated with HCTZ  . Sleep apnea     doesn't use C-pap  . Varicose veins   . Cancer [facility]) 2013    skin cancer  . Arthritis   . History of chicken pox   . GERD (gastroesophageal reflux disease)   . Dysrhythmia     chronic slow heart rate  . Depression     ptsd  . History of kidney stones   . Headache(784.0)     tension headaches non recent  . Shortness of breath dyspnea     PAST SURGICAL HISTORY:   Past Surgical History  Procedure Laterality Date  . Cyst removed       from lip as a child  . Skin cancer removed      from ears,   11 lft arm  rt leg 14  . Lumbar laminectomy/decompression microdiscectomy Bilateral 12/14/2012    Procedure: Bilateral lumbar three-four, four-five decompressive laminotomy/foraminotomy;  Surgeon: Charlie Pitter, MD;  Location: [facility] [place] NEURO ORS;  Service: Neurosurgery;  Laterality: Bilateral;  . Subthalamic stimulator insertion Bilateral 12/06/2013    Procedure: SUBTHALAMIC STIMULATOR INSERTION;  Surgeon: Erline Levine, MD;  Location: [place] NEURO ORS;  Service: Neurosurgery;  Laterality: Bilateral;  Bilateral deep brain stimulator placement  . Pulse generator implant Bilateral 12/13/2013    Procedure: Bilateral implantable pulse generator placement;  Surgeon: Erline Levine, MD;  Location: [place] NEURO ORS;  Service: Neurosurgery;  Laterality: Bilateral;  Bilateral implantable pulse generator placement  . Cholecystectomy N/A 10/22/2014    Procedure: LAPAROSCOPIC CHOLECYSTECTOMY WITH INTRAOPERATIVE CHOLANGIOGRAM;  Surgeon: Dia Crawford III, MD;  Location: [facility] ORS;  Service: General;  Laterality: N/A;    SOCIAL HISTORY:   Social History   Social History  . Marital Status: Married    Spouse Name: N/A  . Number of Children: N/A  . Years of Education: N/A   Occupational History  . disabled     2001, [employer]  .      search and rescue helicopter   Social History Main Topics  . Smoking status: Never Smoker   . Smokeless tobacco: Former Systems developer    . Alcohol Use: 0.0 oz/week    0 Standard drinks or equivalent per week     Comment: occasional (twice per month)  . Drug Use: No  . Sexual Activity: No   Other Topics Concern  . 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    Married 2014    FAMILY HISTORY:   Family Status  Relation Status Death Age  . Mother Alive     Arthritis, unknown medical hx  . Father Deceased     Lung Cancer  . Sister Alive     1 and one half sister  . Brother Alive   . Son Alive   . Daughter Alive     2    ROS:  A complete 10 system review of systems was obtained and was unremarkable apart from what is mentioned above.  PHYSICAL EXAMINATION:    VITALS:   Filed Vitals:   07/24/15 1104  BP: 118/68  Pulse: 46  Height: 5\' 4"  (1.626 m)  Weight: 182 lb (82.555 kg)   Wt Readings from Last 3 Encounters:  07/24/15 182 lb (82.555 kg)  07/21/15 184 lb (83.462 kg)  07/21/15 184 lb (83.462 kg)     GEN:  The  patient appears stated age and is in NAD.   HEENT:  Normocephalic, atraumatic.  The mucous membranes are moist. The superficial temporal arteries are without ropiness or tenderness. Cardiovascular: Regular rate and rhythm Lungs: Clear to auscultation bilaterally   Neurological examination:  Orientation: The patient is alert and oriented x3.  He is awake  Cranial nerves: There is good facial symmetry.  The speech is fluent but dysarthric.    Soft palate rises symmetrically and there is no tongue deviation. Hearing is intact to conversational tone. Sensation: Sensation is intact to light touch throughout. Motor: Strength is 5/5 in the bilateral upper and lower extremities.   Shoulder shrug is equal and symmetric.  There is no pronator drift. Deep tendon reflexes: Deferred today.  Movement examination: Tone: There is normal tone today Abnormal movements: There is a rare tremor of the right foot.   There is dyskinesia of the right shoulder and occasionally in the right neck. Coordination: There is minor decremation with rapid alternating movements bilaterally Gait and Station: The patient has no difficulty arising out of a deep-seated chair without the use of the hands. The patient's stride length is good.  Is not dragging the leg.  Both arm swing well and the right arm is a little dyskinetic in its swing.  DBS programming was performed today which is described in more detail on a separate programming procedural notes.   ASSESSMENT/PLAN:  1.  Parkinsons disease by hx. He has been seeing Dr. Jennelle Human through the [facility].    -The patient is status post bilateral STN DBS on 12/06/2013 with battery placement on 12/13/2013.  -off of mirapex  -He has gone back up on the levodopa to 2 tablets 3 times per day.  While I have no objection to that, I told him that this is the reason he has a little dyskinetic, which is something that he was complaining about.  -he is off of selegiline.   2.  LBP.   -Status post repeat L4-L5 laminotomy with foraminotomy and L1-L2 laminectomy on 06/13/2014 3.  Dysphagia  -He had a  Modified barium swallow on 02/05/2014 that demonstrated moderate pharyngeal phase dysphagia and recommended a dysphagia 3 diet (mechanical soft) with nectar thick liquids.  He is not following these recommendations but is choking more.  Will reschedule a modified barium swallow study.  4.  OSAS with excessive daytime hypersomnolence.  -We talked about morbidity and mortality associated with untreated sleep apnea.    He was not able to tolerate the cpap despite trying multiple masks.   5.  Depression  -on Wellbutrin XL, 450 mg.  PCP added Lexapro yesterday but patient has not yet received it from mail order company.  The patient is seeing a counselor as well.  -felt near suicidal on klonopin 6.  RBD  -Still acting out the dreams some, but felt almost near suicidal on  clonazepam. 7.  Follow-up in the next 25months, sooner should new neurologic issues arise.  Much greater than 50% of this visit was spent in counseling with the patient and the family.  Total face to face time:  25 min not including programming time

## 2015-07-30 ENCOUNTER — Other Ambulatory Visit: Payer: Self-pay | Admitting: Neurology

## 2015-07-30 DIAGNOSIS — R1310 Dysphagia, unspecified: Secondary | ICD-10-CM

## 2015-08-07 ENCOUNTER — Other Ambulatory Visit: Payer: Self-pay | Admitting: Neurology

## 2015-08-07 ENCOUNTER — Ambulatory Visit
Admission: RE | Admit: 2015-08-07 | Discharge: 2015-08-07 | Disposition: A | Discharge status: Home / Self Care | Payer: Medicare PPO | Source: Ambulatory Visit | Attending: Neurology | Admitting: Neurology

## 2015-08-07 ENCOUNTER — Telehealth: Payer: Self-pay | Admitting: Neurology

## 2015-08-07 DIAGNOSIS — R1319 Other dysphagia: Secondary | ICD-10-CM

## 2015-08-07 DIAGNOSIS — R1310 Dysphagia, unspecified: Secondary | ICD-10-CM

## 2015-08-07 DIAGNOSIS — R1312 Dysphagia, oropharyngeal phase: Secondary | ICD-10-CM

## 2015-08-07 NOTE — Therapy (Signed)
[place], [place], [address] Phone: [phone]   Fax:     Modified Barium Swallow  Patient Details  Name: [name] MRN: [id number] Date of Birth: October 25, 1950 No Data Recorded  Encounter Date: 08/07/2015    Past Medical History  Diagnosis Date  . Parkinson's disease ([place])     dx'ed 15 years ago  . PTSD (post-traumatic stress disorder)   . Bradycardia   . Hypertension     treated with HCTZ  . Sleep apnea     doesn't use C-pap  . Varicose veins   . Cancer [facility]) 2013    skin cancer  . Arthritis   . History of chicken pox   . GERD (gastroesophageal reflux disease)   . Dysrhythmia     chronic slow heart rate  . Depression     ptsd  . History of kidney stones   . Headache(784.0)     tension headaches non recent  . Shortness of breath dyspnea     Past Surgical History  Procedure Laterality Date  . Cyst removed       from lip as a child  . Skin cancer removed      from ears,   11 lft arm  rt leg 14  . Lumbar laminectomy/decompression microdiscectomy Bilateral 12/14/2012    Procedure: Bilateral lumbar three-four, four-five decompressive laminotomy/foraminotomy;  Surgeon: Charlie Pitter, MD;  Location: [place] [facility];  Service: Neurosurgery;  Laterality: Bilateral;  . Subthalamic stimulator insertion Bilateral 12/06/2013    Procedure: SUBTHALAMIC STIMULATOR INSERTION;  Surgeon: Erline Levine, MD;  Location: [place] [facility];  Service: Neurosurgery;  Laterality: Bilateral;  Bilateral deep brain stimulator placement  . Pulse generator implant Bilateral 12/13/2013    Procedure: Bilateral implantable pulse generator placement;  Surgeon: Erline Levine, MD;  Location: [place] [facility];  Service: Neurosurgery;  Laterality: Bilateral;  Bilateral implantable pulse generator placement  . Cholecystectomy N/A 10/22/2014    Procedure: LAPAROSCOPIC CHOLECYSTECTOMY WITH INTRAOPERATIVE CHOLANGIOGRAM;  Surgeon: Dia Crawford  III, MD;  Location: [facility] ORS;  Service: General;  Laterality: N/A;    There were no vitals filed for this visit.      Subjective: Patient behavior: (alertness, ability to follow instructions, etc.): Patient with flat affect and hypophonic voice.   Chief complaint: prior MBS 02/05/2014 with recommendation for Dysphagia III with nectar-thick liquid.  Patient has not been compliant for thickened liquids.  Records do not indicate any abnormal chest X-rays since that study.   Objective:  Radiological Procedure: A videoflouroscopic evaluation of oral-preparatory, reflex initiation, and pharyngeal phases of the swallow was performed; as well as a screening of the upper esophageal phase.  I. POSTURE: Upright in MBS chair  II. VIEW: Lateral  III. COMPENSATORY STRATEGIES: small bites/sips  IV. BOLUSES ADMINISTERED:   Thin Liquid: 2 small cup rim sips, 3 rapid consecutive sips   Nectar-thick Liquid: 2 moderate size cup rim sips    Puree: 2 teaspoon presentations   Mechanical Soft: 1/4 graham cracker in applesauce  V. RESULTS OF EVALUATION: A. ORAL PREPARATORY PHASE: (The lips, tongue, and velum are observed for strength and coordination)       **Overall Severity Rating: Within normal limits  B. SWALLOW INITIATION/REFLEX: (The reflex is normal if "triggered" by the time the bolus reached the base of the tongue)  **Overall Severity Rating: Mild; triggers at the vallecular space with solid and nectar-thick liquid and while falling from the valleculae to the pyriform  sinuses with thin liquid  C. PHARYNGEAL PHASE: (Pharyngeal function is normal if the bolus shows rapid, smooth, and continuous transit through the pharynx and there is no pharyngeal residue after the swallow)  **Overall Severity Rating: Mild; decreased pharyngeal pressure generation, decreased hyolaryngeal movement, decreased laryngeal vestibule closure at the height of the swallow.  Mild-moderate vallecular residue of puree  solid, min residue with chewable and liquid boluses  D. LARYNGEAL PENETRATION: (Material entering into the laryngeal inlet/vestibule but not aspirated) X1 during 3 rapid consecutive thin liquid sips  E. ASPIRATION:  None  F. ESOPHAGEAL PHASE: (Screening of the upper esophagus) No abnormalities in the viewable cervical esophagus  ASSESSMENT:  65 year old man with Parkinson's disease and moderate pharyngeal dysphagia per MBS 02/05/2014) is presenting mild oropharyngeal dysphagia characterized by delayed pharyngeal swallow initiation, decreased pharyngeal pressure generation (decreased tongue base retraction and reduced pharyngeal wall contraction), decreased hyolaryngeal movement, incomplete epiglottic inversion, and trace to moderate pharyngeal residue.   Residue is primarily in the vallecular space and greater with puree than with chewable or liquid boluses.  There was one episode of trace laryngeal penetration with the middle of 3 rapid consecutive thin liquid swallows.  This study supports a regular diet with thin liquids- small bites and sips.    PLAN/RECOMMENDATIONS:   A. Diet: Regular   B. Swallowing Precautions: SMALL bites and sips   C. Recommended consultation to N/A   D. Therapy recommendations: consider laryngeal / pharyngeal strengthening exercises- patient is reluctant to participate in speech therapy- he did not like [id number]-[id number]    E. Results and recommendations were discussed with the patient and his wife immediately following the study and final report routed to the referring MD.     Patient will benefit from skilled therapeutic intervention in order to improve the following deficits and impairments:   Dysphagia, oropharyngeal phase  Dysphagia - Plan: DG OP Swallowing Func-Medicare/Speech Path, DG OP Swallowing Func-Medicare/Speech Path, CANCELED: DG SWALLOWING FUNC-SPEECH PATHOLOGY, CANCELED: DG SWALLOWING FUNC-SPEECH PATHOLOGY      G-Codes - 08-11-15 1400    Functional  Assessment Tool Used MBS, clinical judgment   Functional Limitations Swallowing   Swallow Current Status [id number]) At least 20 percent but less than 40 percent impaired, limited or restricted   Swallow Goal Status [id number]) At least 20 percent but less than 40 percent impaired, limited or restricted   Swallow Discharge Status [phone]) At least 20 percent but less than 40 percent impaired, limited or restricted          Problem List Patient Active Problem List   Diagnosis Date Noted  . Colon cancer screening 07/23/2015  . Screening examination for infectious disease 07/23/2015  . Gout 02/18/2015  . Depression 01/06/2015  . Irritation of eyelid 08/20/2014  . Dupuytren's contracture 08/20/2014  . Cough 07/21/2014  . Lumbar stenosis with neurogenic claudication 06/13/2014  . Preop cardiovascular exam 05/28/2014  . S/P deep brain stimulator placement 05/08/2014  . PD (Parkinson's disease) ([place]) 05/08/2014  . Joint pain 01/22/2014  . Medicare annual wellness visit, initial 01/19/2014  . Advance care planning 01/19/2014  . Parkinson's disease ([place]) 12/13/2013  . PTSD (post-traumatic stress disorder) 06/13/2013  . Erectile dysfunction 06/13/2013  . HLD (hyperlipidemia) 06/13/2013  . Hip pain 06/10/2013  . Pain in joint, shoulder region 06/10/2013  . Right leg swelling 06/03/2013  . Essential hypertension 06/03/2013  . Bradycardia by electrocardiogram 06/03/2013  . Obstructive sleep apnea 03/12/2013  . Spinal stenosis, lumbar region, with neurogenic claudication 12/14/2012  [name], [place], [name] 08/07/2015, 2:00 PM  [facility] [place], [place], [address] Phone: [phone]   Fax:     Name: [name] MRN: [id number] Date of Birth: 13-Oct-1950

## 2015-08-07 NOTE — Telephone Encounter (Signed)
New order entered

## 2015-08-27 ENCOUNTER — Ambulatory Visit: Payer: Medicare PPO | Admitting: Psychology

## 2015-08-27 ENCOUNTER — Ambulatory Visit (INDEPENDENT_AMBULATORY_CARE_PROVIDER_SITE_OTHER): Payer: Medicare PPO | Admitting: Psychology

## 2015-08-27 DIAGNOSIS — F321 Major depressive disorder, single episode, moderate: Secondary | ICD-10-CM | POA: Diagnosis not present

## 2015-09-07 ENCOUNTER — Telehealth: Payer: Self-pay | Admitting: Cardiovascular Disease

## 2015-09-07 NOTE — Telephone Encounter (Signed)
Patient did not want to schedule himself .  Says wife will call to schedule.   3 attempts to schedule from recall list.  Deleting recall.

## 2015-09-14 ENCOUNTER — Encounter: Payer: Self-pay | Admitting: Family Medicine

## 2015-09-14 ENCOUNTER — Encounter: Payer: Self-pay | Admitting: Neurology

## 2015-09-14 DIAGNOSIS — G20 Parkinson's disease: Secondary | ICD-10-CM

## 2015-09-15 ENCOUNTER — Ambulatory Visit
Admission: RE | Admit: 2015-09-15 | Discharge: 2015-09-15 | Disposition: A | Discharge status: Home / Self Care | Payer: Medicare PPO | Source: Ambulatory Visit | Attending: Family Medicine | Admitting: Family Medicine

## 2015-09-15 ENCOUNTER — Ambulatory Visit (INDEPENDENT_AMBULATORY_CARE_PROVIDER_SITE_OTHER): Payer: Medicare PPO | Admitting: Family Medicine

## 2015-09-15 ENCOUNTER — Encounter: Payer: Self-pay | Admitting: Family Medicine

## 2015-09-15 ENCOUNTER — Ambulatory Visit (INDEPENDENT_AMBULATORY_CARE_PROVIDER_SITE_OTHER)
Admission: RE | Admit: 2015-09-15 | Discharge: 2015-09-15 | Disposition: A | Discharge status: Home / Self Care | Payer: Medicare PPO | Source: Ambulatory Visit

## 2015-09-15 VITALS — BP 110/68 | HR 60 | Temp 97.8°F | Wt 185.5 lb

## 2015-09-15 DIAGNOSIS — M4806 Spinal stenosis, lumbar region: Secondary | ICD-10-CM

## 2015-09-15 DIAGNOSIS — R079 Chest pain, unspecified: Secondary | ICD-10-CM | POA: Diagnosis not present

## 2015-09-15 DIAGNOSIS — R0789 Other chest pain: Secondary | ICD-10-CM

## 2015-09-15 DIAGNOSIS — M25511 Pain in right shoulder: Secondary | ICD-10-CM | POA: Diagnosis not present

## 2015-09-15 DIAGNOSIS — M48062 Spinal stenosis, lumbar region with neurogenic claudication: Secondary | ICD-10-CM

## 2015-09-15 DIAGNOSIS — S299XXA Unspecified injury of thorax, initial encounter: Secondary | ICD-10-CM | POA: Diagnosis not present

## 2015-09-15 DIAGNOSIS — S4991XA Unspecified injury of right shoulder and upper arm, initial encounter: Secondary | ICD-10-CM | POA: Diagnosis not present

## 2015-09-15 MED ORDER — OXYCODONE-ACETAMINOPHEN 5-325 MG PO TABS: 1.0000 | ORAL_TABLET | Freq: Four times a day (QID) | ORAL | Status: DC | PRN

## 2015-09-15 NOTE — Progress Notes (Signed)
Pre visit review using our clinic review tool, if applicable. No additional management support is needed unless otherwise documented below in the visit note.  Fell at home.  Bruised R side where he fell into a cardboard box.  Had fallen to the R and now with R shoulder and hip pain.  Last fall was yesterday.  No LOC. Has fallen backward prev, is checking with neuro about changes in walker, etc.  Has cane at OV today.  Chest wall sore with deep breath  R sided sciatica continues from prev, predates the recent falls.  He'll call Dr. Annette Stable about f/u about that.   D/w pt about PT/balance exercises in general.    Meds, vitals, and allergies reviewed.   ROS: Per HPI unless specifically indicated in ROS section   nad ncat Neck supple, no LA rrr ctab R shoulder w/o bruising.  Implanted device noted on R anterior upper chest wall.  Normal int/ext rotation and able to raise R arm but pain at the shoulder when leaning forward and reaching forward/downward No arm drop.  Grip still wnl.  Back not ttp midline S/S grossly wnl BLE

## 2015-09-15 NOTE — Patient Instructions (Signed)
[name] will call about your referral. Go to the lab on the way out.  We'll contact you with your xray report. Use the oxycodone in the meantime.  Take care.  Glad to see you.

## 2015-09-16 NOTE — Assessment & Plan Note (Signed)
With sciatica continuing, he'll check with neurosurgery clinic in the meantime.  Plain films likely not useful today given the chronic pain that predates the fall, the prev surgery, and the lack of weakness on exam.  He agrees.

## 2015-09-16 NOTE — Assessment & Plan Note (Signed)
W/o typical cuff sx, refer to ortho and get plain films today, esp given the chest wall pain.  Still okay for outpatient f/u.  He can tolerate oxyCODONE, okay to use for pain in the meantime.  He is checking on walker options through neuro.  >25 minutes spent in face to face time with patient, >50% spent in counselling or coordination of care.

## 2015-09-18 ENCOUNTER — Other Ambulatory Visit: Payer: Self-pay | Admitting: Cardiovascular Disease

## 2015-09-18 ENCOUNTER — Telehealth: Payer: Self-pay | Admitting: Neurology

## 2015-09-18 NOTE — Telephone Encounter (Signed)
Left message on machine for patient to call back.

## 2015-09-18 NOTE — Telephone Encounter (Signed)
[name] 06/26/2050. His wife called regarding a fall he had a few days ago. He fell on his Right side where his controller is. She said he was very sore.  His number is [phone]. He does have an appointment for 6/26. Thank you

## 2015-09-18 NOTE — Telephone Encounter (Signed)
Spoke with patient's wife. He fell on his chest and it is sore where his battery is placed. His DBS is working fine. He is scheduled to start PT in a couple weeks. They will call with any other problems.

## 2015-09-21 DIAGNOSIS — Z923 Personal history of irradiation: Secondary | ICD-10-CM | POA: Diagnosis not present

## 2015-09-21 DIAGNOSIS — Z79899 Other long term (current) drug therapy: Secondary | ICD-10-CM | POA: Diagnosis not present

## 2015-09-21 DIAGNOSIS — M720 Palmar fascial fibromatosis [Dupuytren]: Secondary | ICD-10-CM | POA: Diagnosis not present

## 2015-09-21 DIAGNOSIS — G20 Parkinson's disease: Secondary | ICD-10-CM | POA: Diagnosis not present

## 2015-09-24 ENCOUNTER — Ambulatory Visit (INDEPENDENT_AMBULATORY_CARE_PROVIDER_SITE_OTHER): Payer: Medicare PPO | Admitting: Psychology

## 2015-09-24 ENCOUNTER — Telehealth: Payer: Self-pay

## 2015-09-24 ENCOUNTER — Telehealth: Payer: Self-pay | Admitting: Family Medicine

## 2015-09-24 DIAGNOSIS — F321 Major depressive disorder, single episode, moderate: Secondary | ICD-10-CM

## 2015-09-24 NOTE — Telephone Encounter (Signed)
Opened in error

## 2015-09-24 NOTE — Telephone Encounter (Signed)
[name] (DPR signed) request written order for Life alert for pt due to falls. [name] is hopeful ins will help with cost if ordered by the doctor. [name] request cb.last annual exam 07/21/15.

## 2015-09-25 NOTE — Telephone Encounter (Signed)
Letter printed and mailed to patient.

## 2015-09-25 NOTE — Telephone Encounter (Signed)
Letter done.  Thanks.  Please send if it didn't print.  Thanks.

## 2015-09-26 NOTE — Telephone Encounter (Signed)
Call and check on patient.  Make sure no hematoma of the pocket.  Hard to hurt the device but want to make sure that he is otherwise okay

## 2015-09-28 DIAGNOSIS — M25511 Pain in right shoulder: Secondary | ICD-10-CM | POA: Diagnosis not present

## 2015-09-29 ENCOUNTER — Other Ambulatory Visit (INDEPENDENT_AMBULATORY_CARE_PROVIDER_SITE_OTHER): Payer: Medicare PPO

## 2015-09-29 ENCOUNTER — Other Ambulatory Visit: Payer: Self-pay | Admitting: Family Medicine

## 2015-09-29 DIAGNOSIS — Z1211 Encounter for screening for malignant neoplasm of colon: Secondary | ICD-10-CM | POA: Diagnosis not present

## 2015-09-29 LAB — FECAL OCCULT BLOOD, IMMUNOCHEMICAL: FECAL OCCULT BLD: NEGATIVE

## 2015-10-01 DIAGNOSIS — Z6827 Body mass index (BMI) 27.0-27.9, adult: Secondary | ICD-10-CM | POA: Diagnosis not present

## 2015-10-01 DIAGNOSIS — M5416 Radiculopathy, lumbar region: Secondary | ICD-10-CM | POA: Diagnosis not present

## 2015-10-01 DIAGNOSIS — I10 Essential (primary) hypertension: Secondary | ICD-10-CM | POA: Diagnosis not present

## 2015-10-06 ENCOUNTER — Other Ambulatory Visit (HOSPITAL_COMMUNITY): Payer: Self-pay | Admitting: Neurosurgery

## 2015-10-06 ENCOUNTER — Ambulatory Visit: Payer: Medicare PPO | Attending: Neurology | Admitting: Physical Therapy

## 2015-10-06 DIAGNOSIS — R2689 Other abnormalities of gait and mobility: Secondary | ICD-10-CM | POA: Diagnosis not present

## 2015-10-06 DIAGNOSIS — M5416 Radiculopathy, lumbar region: Secondary | ICD-10-CM

## 2015-10-06 DIAGNOSIS — R2681 Unsteadiness on feet: Secondary | ICD-10-CM | POA: Diagnosis not present

## 2015-10-06 DIAGNOSIS — R29818 Other symptoms and signs involving the nervous system: Secondary | ICD-10-CM | POA: Diagnosis not present

## 2015-10-06 NOTE — Telephone Encounter (Signed)
Patient had chest Xray that was okay.

## 2015-10-07 ENCOUNTER — Other Ambulatory Visit: Payer: Self-pay | Admitting: Orthopedic Surgery

## 2015-10-07 DIAGNOSIS — M25511 Pain in right shoulder: Secondary | ICD-10-CM

## 2015-10-07 NOTE — Therapy (Signed)
[facility] [address] [place], [place], [address] Phone: [phone]   Fax:  [phone]  Physical Therapy Evaluation  Patient Details  Name: [name] MRN: [id number] Date of Birth: [birth year] No Data Recorded  Encounter Date: 10/06/2015      PT End of Session - 10/07/15 1027    Visit Number 1  New episode of care for PT:  10/06/15   Number of Visits 17   Date for PT Re-Evaluation 12/06/15   Authorization Type [employer] P[employer]-Auth Required; G-code every 10th visit   PT Start Time 1022   PT Stop Time 1103   PT Time Calculation (min) 41 min   Equipment Utilized During Treatment Gait belt   Behavior During Therapy [facility] for tasks assessed/performed      Past Medical History  Diagnosis Date  . Parkinson's disease ([place])     dx'ed 15 years ago  . PTSD (post-traumatic stress disorder)   . Bradycardia   . Hypertension     treated with HCTZ  . Sleep apnea     doesn't use C-pap  . Varicose veins   . Cancer [facility]) 2013    skin cancer  . Arthritis   . History of chicken pox   . GERD (gastroesophageal reflux disease)   . Dysrhythmia     chronic slow heart rate  . Depression     ptsd  . History of kidney stones   . Headache(784.0)     tension headaches non recent  . Shortness of breath dyspnea     Past Surgical History  Procedure Laterality Date  . Cyst removed       from lip as a child  . Skin cancer removed      from ears,   12 lft arm  rt leg 14  . Lumbar laminectomy/decompression microdiscectomy Bilateral 12/14/2012    Procedure: Bilateral lumbar three-four, four-five decompressive laminotomy/foraminotomy;  Surgeon: Charlie Pitter, MD;  Location: [place] NEURO ORS;  Service: Neurosurgery;  Laterality: Bilateral;  . Subthalamic stimulator insertion Bilateral 12/06/2013    Procedure: SUBTHALAMIC STIMULATOR INSERTION;  Surgeon: Erline Levine, MD;  Location: [place] NEURO ORS;  Service: Neurosurgery;  Laterality:  Bilateral;  Bilateral deep brain stimulator placement  . Pulse generator implant Bilateral 12/13/2013    Procedure: Bilateral implantable pulse generator placement;  Surgeon: Erline Levine, MD;  Location: [place] NEURO ORS;  Service: Neurosurgery;  Laterality: Bilateral;  Bilateral implantable pulse generator placement  . Cholecystectomy N/A 10/22/2014    Procedure: LAPAROSCOPIC CHOLECYSTECTOMY WITH INTRAOPERATIVE CHOLANGIOGRAM;  Surgeon: Dia Crawford III, MD;  Location: [facility] ORS;  Service: General;  Laterality: N/A;    There were no vitals filed for this visit.       Subjective Assessment - 10/06/15 1025    Subjective Pt is falling more.  He had fall several weeks ago with R shoulder injury.  He typically falls back and to the side.  He falls 5-10 times in a week.  Reports difficulty getting up from low chairs and requires extra effort scooting from  out of bed.   Patient is accompained by: Family member  wife-Candy   Pertinent History Awaiting CT scan for R shoulder injury from fall; DBS bilaterally 2015; Adjustments of DBS through Dr. Carles Collet   Patient Stated Goals Pt's goal for therapy to be able to walk a straight line without falling.   Currently in Pain? Yes   Pain Score 6    Pain Location Leg   Pain Orientation  Right   Pain Descriptors / Indicators Shooting   Pain Type Chronic pain   Pain Onset More than a month ago  6+ months (to have CT scan); hx of back surgeries   Pain Frequency Intermittent   Aggravating Factors  sitting too long, getting out of bed in morning   Pain Relieving Factors Nothing   Multiple Pain Sites Yes   Pain Score 7   Pain Location Shoulder   Pain Orientation Right   Pain Descriptors / Indicators Aching   Pain Type Acute pain   Pain Onset 1 to 4 weeks ago  from fall   Pain Frequency Constant   Aggravating Factors  malpositioning   Pain Relieving Factors position of R arm over head            [facility] PT Assessment - 10/06/15 1032    Assessment   Medical  Diagnosis Parkinson's disease   Onset Date/Surgical Date 08/06/15   Hand Dominance Right   Prior Therapy Fall of 2016-[place]   Precautions   Precautions Fall   Balance Screen   Has the patient fallen in the past 6 months Yes   How many times? --  5-10 times per week   Has the patient had a decrease in activity level because of a fear of falling?  Yes   Is the patient reluctant to leave their home because of a fear of falling?  No   Home Social worker Private residence   Living Arrangements Spouse/significant other   Available Help at Discharge Family   Type of [place] Two level;Laundry or work area in basement  Older home; currently looking for another home   Alternate Level Stairs-Number of Steps 19   Alternate Level Stairs-Rails Right   [employer] - single point;Other (comment);Grab bars - toilet;Grab bars - tub/shower  U-Step RW   Prior Function   Level of Independence Independent   Vocation On disability;Retired  Retired from [employer]; worked for [employer] in the past   Posture/Postural Control   Posture/Postural Control Postural limitations   Postural Limitations Increased lumbar lordosis  Over-arching into posterior direction   ROM / Strength   AROM / PROM / Strength Strength;PROM   PROM   Overall PROM Comments slight increase in tone with P/ROM bilateral lower extremities   Strength   Overall Strength Deficits   Overall Strength Comments Grossly tested at least 4/5 bilaterally, with exception of 3+/5 at R hamstrings   Transfers   Transfers Sit to Stand;Stand to Sit   Sit to Stand 4: Min guard;4: Min assist   Sit to Stand Details (indicate cue type and reason) Strong posterior lean upon initial standing  Wife reports uncontrolled descent into chairs   Five time sit to stand comments  17.58   Stand to Sit 5: Supervision   Ambulation/Gait   Ambulation/Gait Yes   Ambulation/Gait Assistance 5:  Supervision   Ambulation Distance (Feet) 200 Feet   Assistive device None;Straight cane  Has cane, doesn't use during eval   Gait Pattern Step-through pattern;Decreased step length - right;Decreased step length - left;Decreased trunk rotation;Narrow base of support;Decreased arm swing - right;Poor foot clearance - left;Poor foot clearance - right   Ambulation Surface Level;Indoor   Gait velocity 12.2sec = 2.69 ft/sec   Standardized Balance Assessment   Standardized Balance Assessment Timed Up and Go Test   Timed Up and Go Test   Normal TUG (seconds)  15.23   Manual TUG (seconds) 17.35  doesn't normally carry things at home   Cognitive TUG (seconds) 16.21   TUG Comments Scores >13.5-15 sec indicate increased fall risk.   High Level Balance   High Level Balance Comments Posterior push and release:  >2 steps in posterior direction with therapist assist   Functional Gait  Assessment   Gait assessed  Yes   Gait Level Surface Walks 20 ft, slow speed, abnormal gait pattern, evidence for imbalance or deviates 10-15 in outside of the 12 in walkway width. Requires more than 7 sec to ambulate 20 ft.  7.64   Change in Gait Speed Makes only minor adjustments to walking speed, or accomplishes a change in speed with significant gait deviations, deviates 10-15 in outside the 12 in walkway width, or changes speed but loses balance but is able to recover and continue walking.   Gait with Horizontal Head Turns Performs head turns with moderate changes in gait velocity, slows down, deviates 10-15 in outside 12 in walkway width but recovers, can continue to walk.   Gait with Vertical Head Turns Performs task with moderate change in gait velocity, slows down, deviates 10-15 in outside 12 in walkway width but recovers, can continue to walk.   Gait and Pivot Turn Pivot turns safely in greater than 3 sec and stops with no loss of balance, or pivot turns safely within 3 sec and stops with mild imbalance, requires small  steps to catch balance.   Step Over Obstacle Is able to step over one shoe box (4.5 in total height) but must slow down and adjust steps to clear box safely. May require verbal cueing.   Gait with Narrow Base of Support Ambulates 4-7 steps.   Gait with Eyes Closed Walks 20 ft, slow speed, abnormal gait pattern, evidence for imbalance, deviates 10-15 in outside 12 in walkway width. Requires more than 9 sec to ambulate 20 ft.  11.89 veer to L   Ambulating Backwards Cannot walk 20 ft without assistance, severe gait deviations or imbalance, deviates greater than 15 in outside 12 in walkway width or will not attempt task.  11.92   Steps Cannot do safely.   Total Score 9   FGA comment: Scores <15/30 indicate increased fall risk.                             PT Short Term Goals - 10/07/15 1033    PT SHORT TERM GOAL #1   Title Pt will perform HEP independently for improved balance, transfers, and gait.  TARGET 11/05/15   Time 4   Period Weeks   Status New   PT SHORT TERM GOAL #2   Title Pt will improve TUG score to less than or equal to 13.5 seconds for decreased fall risk.   Time 4   Period Weeks   Status New   PT SHORT TERM GOAL #3   Title Pt will improve 5x sit<>stand to less than or equal to 14 seconds for decreased fall risk.   Time 4   Period Weeks   Status New   PT SHORT TERM GOAL #4   Title Pt will improve Functional Gait ASsessment to at least 14/30 for decreased fall risk.   Time 4   Period Weeks   Status New   PT SHORT TERM GOAL #5   Title Pt will verbalize understanding of local Parkinson's resources.   Time 4   Period  Weeks   Status New           PT Long Term Goals - October 22, 2015 1036    PT LONG TERM GOAL #1   Title Pt will verbalize understanding of fall prevention/tips to reduce freezing with gait in home environment.  TARGET 12/06/15   Time 8   Period Weeks   Status New   PT LONG TERM GOAL #2   Title Pt will improve TUG manual to less than or  equal to 15 seconds for decreased fall risk.   Time 8   Period Weeks   Status New   PT LONG TERM GOAL #3   Title Pt will perform at least 8 of 10 reps of sit<>stand transfers from less than 18 inch height, independently and no posterior lean or LOB, for improved transfer efficiency and safety.   Time 8   Period Weeks   Status New   PT LONG TERM GOAL #4   Title Pt will improve Functional GAit Assessment to at least 19/30 for decreased fall risk.   Time 8   Period Weeks   PT LONG TERM GOAL #5   Title Pt will verbalize understanding of ongoing community fitness options upon D/C from PT.   Time 8   Period Weeks   Status New               Plan - 10-22-2015 1028    Clinical Impression Statement Pt is a 65 year old male who presents to OP PT for evaluation, with history of Parkinson's disease and deep brain stimulator placement in 2015.  Pt presents with decreased timing and coordination of gait, posture abnormalities, decreased balance, falling (at least 5-10 times per week), decreased strength, decreased efficiency and safety with transfers.  Pt is at high fall risk per Functional Gait Assessment and TUG scores.  Pt has slowed transfer speed with 5x sit<>stand, with increased retropulsion noted upon standing.  Pt demonstrates decreased balance recovery in push and release test.  Pt would benefit from skilled physical therapy to address the above stated deficits for improved functional mobility and decreased fall risk/decreased frequency of falls.   Rehab Potential Good   PT Frequency 2x / week  1x/wk for 1 week, then   PT Duration 8 weeks  plus eval   PT Treatment/Interventions ADLs/Self Care Home Management;Therapeutic exercise;Therapeutic activities;Functional mobility training;Gait training;Stair training;DME Instruction;Balance training;Neuromuscular re-education;Patient/family education   PT Next Visit Plan Initiate [facility]) for HEP in sitting and  standing; transfer training, gait training with turns   Consulted and Agree with Plan of Care Patient;Family member/caregiver   Family Member Consulted wife      Patient will benefit from skilled therapeutic intervention in order to improve the following deficits and impairments:  Abnormal gait, Decreased activity tolerance, Decreased balance, Decreased mobility, Decreased strength, Difficulty walking, Postural dysfunction  Visit Diagnosis: Other abnormalities of gait and mobility  Unsteadiness on feet  Other symptoms and signs involving the nervous system      G-Codes - 10/22/2015 1039    Functional Assessment Tool Used TUG 15.23 sec, TUG ,manual 17.35 sec, TUG cognitive 16.21 sec, 5x sit<>stnad 17.58 sec; FGA 9/30   Functional Limitation Mobility: Walking and moving around   Mobility: Walking and Moving Around Current Status [phone]) At least 40 percent but less than 60 percent impaired, limited or restricted   Mobility: Walking and Moving Around Goal Status [id number]) At least 20 percent but less than 40 percent impaired, limited or  restricted       Problem List Patient Active Problem List   Diagnosis Date Noted  . Colon cancer screening 07/23/2015  . Screening examination for infectious disease 07/23/2015  . Gout 02/18/2015  . Depression 01/06/2015  . Irritation of eyelid 08/20/2014  . Dupuytren's contracture 08/20/2014  . Cough 07/21/2014  . Lumbar stenosis with neurogenic claudication 06/13/2014  . Preop cardiovascular exam 05/28/2014  . S/P deep brain stimulator placement 05/08/2014  . PD (Parkinson's disease) ([place]) 05/08/2014  . Joint pain 01/22/2014  . Medicare annual wellness visit, initial 01/19/2014  . Advance care planning 01/19/2014  . Parkinson's disease ([place]) 12/13/2013  . PTSD (post-traumatic stress disorder) 06/13/2013  . Erectile dysfunction 06/13/2013  . HLD (hyperlipidemia) 06/13/2013  . Hip pain 06/10/2013  . Pain in joint, shoulder region 06/10/2013   . Right leg swelling 06/03/2013  . Essential hypertension 06/03/2013  . Bradycardia by electrocardiogram 06/03/2013  . Obstructive sleep apnea 03/12/2013  . Spinal stenosis, lumbar region, with neurogenic claudication 12/14/2012    Kevron Patella W. 10/07/2015, 10:40 AM  Frazier Butt., PT  [facility] [address] [place], [place], [address] Phone: [phone]   Fax:  [phone]  Name: [name] MRN: [id number] Date of Birth: 04-May-1950

## 2015-10-09 ENCOUNTER — Ambulatory Visit: Payer: Medicare PPO | Admitting: Physical Therapy

## 2015-10-12 ENCOUNTER — Ambulatory Visit (INDEPENDENT_AMBULATORY_CARE_PROVIDER_SITE_OTHER): Payer: Medicare PPO | Admitting: Neurology

## 2015-10-12 ENCOUNTER — Encounter: Payer: Self-pay | Admitting: Neurology

## 2015-10-12 ENCOUNTER — Telehealth: Payer: Self-pay | Admitting: Neurology

## 2015-10-12 VITALS — BP 130/82 | HR 45 | Ht 69.0 in | Wt 179.0 lb

## 2015-10-12 DIAGNOSIS — Z9689 Presence of other specified functional implants: Secondary | ICD-10-CM | POA: Diagnosis not present

## 2015-10-12 DIAGNOSIS — L728 Other follicular cysts of the skin and subcutaneous tissue: Secondary | ICD-10-CM | POA: Diagnosis not present

## 2015-10-12 DIAGNOSIS — M5417 Radiculopathy, lumbosacral region: Secondary | ICD-10-CM

## 2015-10-12 DIAGNOSIS — G20 Parkinson's disease: Secondary | ICD-10-CM | POA: Diagnosis not present

## 2015-10-12 DIAGNOSIS — G4752 REM sleep behavior disorder: Secondary | ICD-10-CM | POA: Diagnosis not present

## 2015-10-12 DIAGNOSIS — Z1283 Encounter for screening for malignant neoplasm of skin: Secondary | ICD-10-CM | POA: Diagnosis not present

## 2015-10-12 DIAGNOSIS — Z872 Personal history of diseases of the skin and subcutaneous tissue: Secondary | ICD-10-CM | POA: Diagnosis not present

## 2015-10-12 DIAGNOSIS — D485 Neoplasm of uncertain behavior of skin: Secondary | ICD-10-CM | POA: Diagnosis not present

## 2015-10-12 NOTE — Telephone Encounter (Signed)
Note faxed to Dr. Trenton Gammon at [phone] making him aware patient can have MR if needed but will need to be coordinated with [employer] rep and patient has dramatic increase in tremor with device off.

## 2015-10-12 NOTE — Procedures (Signed)
DBS Programming was performed.    Total time spent programming was 20 minutes.  Device was confirmed to be on.  Soft start was confirmed to be on at 8.  Impedences were checked and were within normal limits.  Battery was checked and was determined to be functioning normally and not near the end of life.   This was all documented on a separate neurophysiologic worksheet.  Final settings were as follows:  Left brain electrode:     1-C+           ; Amplitude  4.0   V   ; Pulse width 90 microseconds;   Frequency   140   Hz.  Right brain electrode:     2-C+          ; Amplitude   4.3  V ;  Pulse width 60  microseconds;  Frequency   130    Hz.

## 2015-10-12 NOTE — Progress Notes (Signed)
[name] was seen today in the movement disorders clinic for neurologic consultation at the request of Dr. Annette Stable. His PCP is at the [facility] in [place].  The consultation is for the evaluation of Parkinson's disease.  The patient is accompanied by his wife who supplements the history.  The patient previously saw Dr. Jennelle Human at [facility] and I do have several of those records.  Patient was diagnosed with Parkinson's disease about 15 years ago, at the age of 74-61 years old.  He states that the first sx was not swinging of the R arm and some minor balance changes.  Balance has gotten worse with time.   The patient is currently on carbidopa/levodopa 25/100, 3 at 9am, 2 at 1, 3 at 5pm, 2 at 9 pm and 1 carbidopa/levodopa 25/100 CR at bedtime.  He is also on Mirapex 0.5 mg 3 times a day and selegiline 5 mg daily.  Because of dyskinesia, Dr. Nicki Reaper did talk to him about DBS, but according to Dr. Bary Leriche notes, the patient was skeptical.  He did have a neuropsych evaluation at the [facility] in [place], but it appears that this was more of counseling and no testing.  He did have a nocturnal polysomnogram on 06/20/2010 demonstrating an apnea hypopnea index of 16.  07/11/13 update:  Pt is f/u today re: PD.  This patient is accompanied in the office by his spouse who supplements the history.  He is on carbidopa/levodopa, 3 at 9am, 2 at 1, 3 at 5pm, 2 at 9 pm and takes carbidopa/levodopa CR 25/100  at bedtime.  This is a total daily levodopa dose of 1100 mg per day.  He is on mirapex 0.5 mg three times per day and selegeline 5 mg in the AM.  He states, "I am ready for DBS.  Tell me about what I need to do."  Pt c/o dyskinesias.  Falls on average once per week.  No hallucinations.  No n/v.  No syncope.  States that he saw himself singing at [place] and could not believe how much dyskinesia he had.  He states that when his medications don't work, he "hits a wall."  This is sometimes very unpredictable.  07/24/13:  Present with  wife for on/off test.  Actually went to PT late yesterday afternoon and was off of medication and had a very hard time.  While he has been off medication for 36 hours, his wife reports that she thinks that he has done remarkably well.  There has been minimal tremor and he really hasn't been dragging his leg much.  He expresses desire to proceed with DBS.  10/25/13 update:  Patient presents today with his wife, who supplements the history.  The patient is preop DBS.  He is scheduled for deep brain stimulation surgery on 12/06/2013.  He is scheduled for IPG placement on 12/13/2013.  Since our last visit, he did have neuropsychometric testing.  Results of this were favorable for DBS placement.  It was recommended that he faithfully wear his CPAP.  He just had his preop MRI of the brain done.  Patient is currently taking levodopa 25/100, on the following schedule: 3/2/3/2 and then an additional carbidopa/levodopa 25/100 CR at bedtime.  This is in addition to pramipexole 0.5 mg 3 times a day and selegiline 5 mg in the morning.  Overall doing well.  No hallucinations.  No falls.  Has had some more of his chronic back pain.  01/08/14 update:  The patient  presents today for followup.  He is accompanied by his wife who supplements the history.  The patient underwent bilateral STN DBS on 12/06/2013 with generator placement on 12/13/2013.  The patient's wife reports that his Parkinson's disease symptoms have actually been much better since surgery.  He did go back to taking his previous dose of medication, however.  He is on carbidopa/levodopa 25/100 on the following schedule: 3/2/3/2 and then he takes an additional carbidopa/levodopa 25/100 CR at night.  He is also on Mirapex 0.5 mg 3 times a day as well as selegiline 5 mg in the morning.  He had 2 falls since last visit.  On the one, he actually fell down a few stairs and states that he was actually able to roll onto the carpet.  He did not get hurt.  With the other one,  he states that he was walking on a flat surface and just did not expect there to be a step downward and there was.  Again, he did not get hurt.  Wife reports minor dyskinesia.  They have noted no tremor.  Rigidity has been better.  He has been very sleepy.  Wife thinks that this is because he only sleeps about 4 hours at night because he is trying to do so much during the day.  01/16/14 update:  Patient returns today, accompanied by his wife who supplements the history.  Overall, the patient has been doing well.  He remains very sleepy.  He is not sure if that is from medication.  He refuses to wear the CPAP.  He is supposed to be on a tablet of carbidopa/levodopa 25/100 per day but has dropped down to 6 on his own.  He is on pramipexole 0.5 mg twice per day.  He does not want to start speech therapy until November, because he is going out of town.  He had one fall on the stairs since last visit.  Reports that he has an old house with small steps.  He denies any dyskinesia.  He has had some swelling in the hands, right greater than left.  He has an appointment to discuss this with his primary care physician.  He denies any tremor.    01/22/14 update:  Patient returns today for followup, accompanied by his wife who supplements the history.  Patient called me almost immediately after last visit complaining about dyskinesia and he wanted me to turn his settings back down.  I had told him to instead to decrease his levodopa up to 2 tablets in the morning, one in the afternoon and one in the evening and we had already been weaning his Mirapex.  That stopped the dyskinesias which were in the R arm and he admits that some days he is only taking 3 levodopa per day.  He is sometimes having what sounds like myoclonic jerking in the R arm.  He also c/o "aching" in the joints all over the body "but it doesn't feel like the stiffness of parkinsons disease."  He still has swelling.  He did f/u with PCP but etiology is unknown.     02/17/14 update:  This patient is accompanied in the office by his spouse who supplements the history.  Has had several falls.  Almost fell into the fireplace.  [place] backwards going down the stairs.  No serious injuries but still multiple falls.  Generally occur on the stairs.  Wrecked his [place] as missed the brake.  No tremor but did turn off his device  and notes that tremor will immediately return.  Off the mirapex but still sleeping "all the time."  Wife states that no longer doing the things that he is to work to do.  He is less involved with the church, which he used to be very passionate about.  He is not exercising like he was.  He started speech therapy today.  He had a  Modified barium swallow on 02/05/2014 that demonstrated moderate pharyngeal phase dysphagia and recommended a dysphagia 3 diet (mechanical soft) with nectar thick liquids.  They just got the nectar thick-it today.  His wife thinks that perhaps he is depressed.  She thinks that maybe that is why he is sleeping all the time.the patient also asked me about a referral to orthopedics for a rotator cuff problem on the left.  He is having severe pain, but does not necessarily want surgery.  04/15/14 update:  The patient returns today for follow-up, accompanied by his wife who supplements the history.  He remains on carbidopa/levodopa 25/100, 1 tablet 4 times per day.  He was started on Wellbutrin last visit.  This has been worked up to 300 mg daily.  His mood is much better.  His motivation is much better.  He is sleeping much less.  His balance is not very good.  He has not yet started physical therapy, but plans to next week.  He has finished LSVT loud.  He notices some tremor of the left hand and tremor of the right face.  We did have him try to turn off the DBS to see what would happen with the tremor right face and he got much worse.  He has seen Dr. Maureen Ralphs and had cortisone injections into the right shoulder.  That has helped.  He had an  EMG that demonstrated chronic multilevel radiculopathy from L2-S1 bilaterally.  There was no evidence of peripheral neuropathy.  05/08/13 update:  This patient is accompanied in the office by his spouse who supplements the history.  Overall, the patient is doing well but he is contemplating back surgery.  He saw Dr. Trenton Gammon regarding this and is supposed to have a CT myelogram soon.  He presents today because he is having some tremor of the right face and arm and left arm tremor, especially when he is stressed.  He takes 2 levodopa in the morning, one in the afternoon and one in the evening.  He really does fairly well with that.  Tremor is worse in the evening.  No falls.  He is doing fairly well with Wellbutrin for his mood.  He has put physical therapy on hold.  07/03/14 update:  Patient is accompanied by his wife, who supplements the history.  He had a back surgery on 06/13/2014 by Dr. Trenton Gammon.  Records are reviewed from the surgery.  He had an L1-L2 laminectomy and reexplored the L4-L5 laminectomies with foraminotomies.  The patient was released from rehabilitation on March 11 and subsequently came to our Parkinson's educational event.  At that event, we talked about the fact that DBS can worsen speech and he thinks that perhaps the device has caused some speech difficulties.  His wife also thinks that his mood has gotten worse, especially since his most recent back surgery.  He is doing better in terms of the back pain.  He is weaning off of gabapentin.  Physical therapy is coming into his house.  He has noticed some left hand and right leg tremor.  07/28/14 update:  Pt is  accompanied by his wife, who supplements the history.  He is continuing to have EDS, which he has had long prior to DBS therapy.  He has OSAS but has not been able to tolerate CPAP therapy.  He has no cardiac disease.  We made significant changes to his programing last visit because of stuttering/speech issues and his wife states this is much  better.  He is going to resume ST because of choking issues and that is to restart tomorrow.  He is having some tremor in the L hand and right shoulder.  When asked about mood, his wife states that "he needs an attitude adjustment some days."  She does state that his driving seems much better after his gabapentin was discontinued.  He is not having any lightheadedness.  No hallucinations.  No confusion.  11/28/14 update:  The patient is following up today, accompanied by his wife who supplements the history.  He is on carbidopa/levodopa 25/100, 2 tablets in the morning, one in the afternoon and one in the evening but admits that he really doesn't take it this way and is lucky to get in 3 per day (hadn't taken any when I saw him today at 11:15 pm).  Last visit, I added Provigil because of excessive daytime hypersomnolence and we ultimately tried to increase it over the phone to twice a day dosing, but by June, the patient had discontinued it because he was doing better from this regard.  He called me on June 6, however complaining about acting out his dreams.  We added clonazepam, 0.5 mg, half a tablet at night.  Only a week later the patient had discontinued as he felt that it perhaps worsened his mood but he also thought that it caused diarrhea.  Not long after that, however, the patient ended up in the emergency room with abdominal pain and was diagnosed with a porcelain gallbladder and had surgery to have his gallbladder removed on 10/21/2014.  His wife states today, however, that the klonopin made him seriously depressed.  He did hallucinate with the morphine in the hospital.  His wife states that they saw someone they hadn't seen in a longtime and they commented how well he looked.  He has started PT several times and had to stop for various reasons.  He attended a support group in [place].    04/01/15 update:  The patient is following up today, accompanied by his wife who supplements the history.  The  patient has Parkinson's disease and is status post deep brain stimulator placement.  He remains on carbidopa/levodopa 25/100, 2 tablets in the morning, one in the afternoon and one in the evening but instead of taking the last dose in the evening he will take it at bedtime and instead of taking the one in the middle of the afternoon he will take that late evening.  Therefore, he will end up having tremor in the right hand late in the day.  Overall, he is doing well and "most days you don't know he has PD" according to his wife.  He has been going to physical therapy at [facility].  His wife states that insurance only covered 3 weeks of therapy and then cut him off and his wife is hoping to re-enroll him after the first of the year.    I have reviewed records since our last visit.  Dr. Damita Dunnings increased his Wellbutrin XL from 300 mg to 450 mg on 01/06/2015.  That helped a lot and he  is more like his old self.   The patient has also seen Dr. Ashby Dawes since our last visit.  He has a history of known sleep apnea and has been noncompliant with his CPAP.  Dr. Ashby Dawes recommended a re-titration study and the patient is awaiting that study.  Vivid dreams continue.    07/24/15 update:  The patient follows up today, accompanied by his wife who supplements the history.  I have also reviewed prior records made available to me.  The patient is status post deep brain stimulation to the bilateral STN in August, 2015.  He remains on carbidopa/levodopa 25/100, 2 tablets three times a day.  His biggest issue has been depression.  He is on Wellbutrin XL, 450 mg daily.  Just 2 days ago his primary care physician added Lexapro.  It was sent to the mail order pharmacy and he has not gotten it yet.  He thought he was supposed to stop his Wellbutrin and asked me about that today.  He is also under the care of a counselor.  He states that he saw her yesterday.  He is not suicidal or homicidal.  His primary care physician also gave him  a prescription for a new walker because he could not use his old walker in his house because of the size.  His wife does state that he has had more difficulty with choking and swallowing.  He really is noncompliant, however, with recommendations on his prior swallowing study.  He eats whatever he wants and prior swallowing study recommended nectar thick liquids.  He does not follow this.  He is not exercising.  10/12/15 update: The patient follows up today, accompanied by his wife who supplements the history.  I have also reviewed prior records made available to me.  The patient is status post deep brain stimulation to the bilateral STN in August, 2015.  He was on carbidopa/levodopa 25/100, 2 tablets 3 times per day but he has dropped it back to 2/1/1 and states that he is doing well with that.  He did fall into a chest of drawers in May and hit his battery on the right.  That made him sore, but no hematoma.  He also fell in his shop and fell into a lampshade.   He has had shoulder pain and is seeing ortho.  Was told that he cannot have MRI so they are doing CT on wed at [facility] imaging. They are planning to move to a one level house and they will only need to put in ramps.  They plan to change the bathroom entrances to widen them and then raise toilet seats.  He just started physical therapy on 10/07/2015.  He did have a modified barium swallow on 08/07/2015.  It demonstrated mild oral pharyngeal dysphagia (an improvement from 2015 examination) and a regular diet with thin liquids was recommended.  He is on Wellbutrin XL, 450 mg daily and right after last visit, started on Lexapro for depression. He is also under the care of a counselor.   Wife states that mood is good except when he falls.  Still having vivid dreams.  He is also having back pain again and plans to have CT.   Neuroimaging has  previously been performed.  It is not available for my review today.  PREVIOUS MEDICATIONS: Sinemet, Sinemet CR, Mirapex  and Eldpryl; klonopin (depressed)  ALLERGIES:   Allergies  Allergen Reactions  . Klonopin [Clonazepam] Other (See Comments)    Worsening mood/depression  .  Morphine And Related     hallucinations    CURRENT MEDICATIONS:    Medication List       This list is accurate as of: 10/12/15  8:00 AM.  Always use your most recent med list.               buPROPion 150 MG 24 hr tablet  Commonly known as:  WELLBUTRIN XL  TAKE 3 TABLETS DAILY     carbidopa-levodopa 25-100 MG tablet  Commonly known as:  SINEMET IR  Take by mouth. Take 2 tablets by mouth in the morning, 2 tablets in the afternoon and take 2 tablet by mouth in the evening.     cholecalciferol 1000 units tablet  Commonly known as:  VITAMIN D  Take 1,000 Units by mouth daily.     escitalopram 10 MG tablet  Commonly known as:  LEXAPRO  Take 1 tablet (10 mg total) by mouth daily.     hydrochlorothiazide 25 MG tablet  Commonly known as:  HYDRODIURIL  TAKE 1 TABLET DAILY     oxyCODONE-acetaminophen 5-325 MG tablet  Commonly known as:  PERCOCET/ROXICET  Take 1-2 tablets by mouth every 6 (six) hours as needed for severe pain (sedation caution).     pravastatin 20 MG tablet  Commonly known as:  PRAVACHOL  TAKE 1 TABLET EVERY EVENING         PAST MEDICAL HISTORY:   Past Medical History  Diagnosis Date  . Parkinson's disease ([place])     dx'ed 15 years ago  . PTSD (post-traumatic stress disorder)   . Bradycardia   . Hypertension     treated with HCTZ  . Sleep apnea     doesn't use C-pap  . Varicose veins   . Cancer [facility]) 2013    skin cancer  . Arthritis   . History of chicken pox   . GERD (gastroesophageal reflux disease)   . Dysrhythmia     chronic slow heart rate  . Depression     ptsd  . History of kidney stones   . Headache(784.0)     tension headaches non recent  . Shortness of breath dyspnea     PAST SURGICAL HISTORY:   Past Surgical History  Procedure Laterality Date  . Cyst removed        from lip as a child  . Skin cancer removed      from ears,   11 lft arm  rt leg 14  . Lumbar laminectomy/decompression microdiscectomy Bilateral 12/14/2012    Procedure: Bilateral lumbar three-four, four-five decompressive laminotomy/foraminotomy;  Surgeon: Charlie Pitter, MD;  Location: [place] NEURO ORS;  Service: Neurosurgery;  Laterality: Bilateral;  . Subthalamic stimulator insertion Bilateral 12/06/2013    Procedure: SUBTHALAMIC STIMULATOR INSERTION;  Surgeon: Erline Levine, MD;  Location: [place] NEURO ORS;  Service: Neurosurgery;  Laterality: Bilateral;  Bilateral deep brain stimulator placement  . Pulse generator implant Bilateral 12/13/2013    Procedure: Bilateral implantable pulse generator placement;  Surgeon: Erline Levine, MD;  Location: [place] NEURO ORS;  Service: Neurosurgery;  Laterality: Bilateral;  Bilateral implantable pulse generator placement  . Cholecystectomy N/A 10/22/2014    Procedure: LAPAROSCOPIC CHOLECYSTECTOMY WITH INTRAOPERATIVE CHOLANGIOGRAM;  Surgeon: Dia Crawford III, MD;  Location: [facility] ORS;  Service: General;  Laterality: N/A;    SOCIAL HISTORY:   Social History   Social History  . Marital Status: Married    Spouse Name: N/A  . Number of Children: N/A  . Years of Education: N/A  Occupational History  . disabled     2001, [employer]  .      search and rescue helicopter   Social History Main Topics  . Smoking status: Never Smoker   . Smokeless tobacco: Former Systems developer  . Alcohol Use: 0.0 oz/week    0 Standard drinks or equivalent per week     Comment: occasional (twice per month)  . Drug Use: No  . Sexual Activity: No   Other Topics Concern  . 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    Married 2014    FAMILY HISTORY:   Family Status  Relation Status Death Age  . Mother Alive     Arthritis, unknown medical hx  . Father Deceased     Lung Cancer  . Sister Alive     1 and one  half sister  . Brother Alive   . Son Alive   . Daughter Alive     2    ROS:  A complete 10 system review of systems was obtained and was unremarkable apart from what is mentioned above.  PHYSICAL EXAMINATION:    VITALS:   There were no vitals filed for this visit. Wt Readings from Last 3 Encounters:  09/15/15 185 lb 8 oz (84.142 kg)  07/24/15 182 lb (82.555 kg)  07/21/15 184 lb (83.462 kg)     GEN:  The patient appears stated age and is in NAD.   Affect more flat today. HEENT:  Normocephalic, atraumatic.  The mucous membranes are moist. The superficial temporal arteries are without ropiness or tenderness. Cardiovascular: Regular rate and rhythm Lungs: Clear to auscultation bilaterally   Neurological examination:  Orientation: The patient is alert and oriented x3.  He is awake  Cranial nerves: There is good facial symmetry.  The speech is fluent but dysarthric.    Soft palate rises symmetrically and there is no tongue deviation. Hearing is intact to conversational tone. Sensation: Sensation is intact to light touch throughout. Motor: Strength is 5/5 in the bilateral upper and lower extremities.   Shoulder shrug is equal and symmetric.  There is no pronator drift. Deep tendon reflexes: Deferred today.  Movement examination: Tone: There is normal tone today Abnormal movements: There is a rare tremor of the right foot.  There is dyskinesia of the right shoulder and occasionally in the right neck. Coordination: There is minor decremation with hand opening and closing on the right Gait and Station: The patient has no difficulty arising out of a deep-seated chair without the use of the hands. The patient's stride length is good.  Is not dragging the leg.  Both arm swing well and the right arm is a little dyskinetic in its swing.  DBS programming was performed today which is described in more detail on a separate programming procedural notes.   ASSESSMENT/PLAN:  1.  Parkinsons  disease by hx. He has been seeing Dr. Jennelle Human through the [facility].    -The patient is status post bilateral STN DBS on 12/06/2013 with battery placement on 12/13/2013.  -off of mirapex  -He has gone back up on the levodopa to 2 tablets 3 times per day.  While I have no objection to that, I told him that this is the reason he has a little dyskinetic, which is something that he was complaining about.  -he is off  of selegiline.   2.  LBP.   -Status post repeat L4-L5 laminotomy with foraminotomy and L1-L2 laminectomy on 06/13/2014.  Back pain has returned and he is to have CT.  Reminded him that he can have specialized MRI. Will call Dr. Orvis Brill office to let them know but also that he does have lots of tremor with device off and that could be issue. n F/u with Dr. Trenton Gammon 3.  Dysphagia  -He had a  Modified barium swallow on 02/05/2014 that demonstrated moderate pharyngeal phase dysphagia and recommended a dysphagia 3 diet (mechanical soft) with nectar thick liquids.  He is not following these recommendations but is choking more.  Will reschedule a modified barium swallow study.  4.  OSAS with excessive daytime hypersomnolence.  -We talked about morbidity and mortality associated with untreated sleep apnea.    He was not able to tolerate the cpap despite trying multiple masks.   Sleeping for hours in day but still sleeping well at night.  5.  Depression  -on Wellbutrin XL, 450 mg.  PCP added Lexapro yesterday but patient has not yet received it from mail order company.  The patient is seeing a counselor as well.  -felt near suicidal on klonopin 6.  RBD  -Still acting out the dreams some, but felt almost near suicidal on clonazepam.  Try to add melatonin 30 min-1hr prior to sleep 7.  R shoulder pain  -seeing ortho.  Ortho told him that he couldn't have MRI because of DBS but told him that he could.  Wife tried to tell them this but they refused and CT is ordered.  Movement could be an  issue if device is off but it could be worth the trial.   8.  Follow-up in the next 17months, sooner should new neurologic issues arise.  Much greater than 50% of this visit was spent in counseling with the patient and the family.  Total face to face time:  25 min not including programming time

## 2015-10-13 ENCOUNTER — Ambulatory Visit: Payer: Medicare PPO | Admitting: Physical Therapy

## 2015-10-13 DIAGNOSIS — R2689 Other abnormalities of gait and mobility: Secondary | ICD-10-CM

## 2015-10-13 DIAGNOSIS — R2681 Unsteadiness on feet: Secondary | ICD-10-CM

## 2015-10-13 DIAGNOSIS — R29818 Other symptoms and signs involving the nervous system: Secondary | ICD-10-CM | POA: Diagnosis not present

## 2015-10-13 NOTE — Patient Instructions (Addendum)
Sit to Stand Transfers:  1. Scoot out to the edge of the chair 2. Place your feet flat on the floor, shoulder width apart.  Make sure your feet are tucked just under your knees. 3. Lean forward (nose over toes) with momentum, and stand up tall with your best posture.  If you need to use your arms, use them as a quick boost up to stand. 4. If you are in a low or soft chair, you can lean back and then forward up to stand, in order to get more momentum. 5. Once you are standing, make sure you are looking ahead and standing tall.  To sit down:  1. Back up until you feel the chair behind your legs. 2. Bend at you hips, reaching  Back for you chair, if needed, then slowly squat to sit down on your chair. 3. When you turn to sit, fully turn and make sure the chair is behind you as you sit.  When you first stand up: -weightshift side to side several times prior to taking initial big step with gait  -When you are standing still for a period of time: -Stagger your feet one slightly ahead of the other and weightshift forward and back to avoid posterior lean  PWR! Moves in sitting and standing -10 reps each modified without use of UEs (PWR! Up, Rock, Step) -1-2 times per day

## 2015-10-14 ENCOUNTER — Encounter: Payer: Self-pay | Admitting: Family Medicine

## 2015-10-14 ENCOUNTER — Encounter: Payer: Self-pay | Admitting: Neurology

## 2015-10-14 ENCOUNTER — Ambulatory Visit
Admission: RE | Admit: 2015-10-14 | Discharge: 2015-10-14 | Disposition: A | Discharge status: Home / Self Care | Payer: Medicare PPO | Source: Ambulatory Visit | Attending: Orthopedic Surgery | Admitting: Orthopedic Surgery

## 2015-10-14 ENCOUNTER — Ambulatory Visit: Admission: RE | Admit: 2015-10-14 | Payer: Medicare PPO | Source: Ambulatory Visit

## 2015-10-14 DIAGNOSIS — M25511 Pain in right shoulder: Secondary | ICD-10-CM

## 2015-10-14 MED ORDER — IOPAMIDOL (ISOVUE-M 200) INJECTION 41%
12.0000 mL | Freq: Once | INTRAMUSCULAR | Status: AC
  Administered 2015-10-14: 12 mL via INTRA_ARTICULAR

## 2015-10-14 NOTE — Therapy (Signed)
[facility] [name] [place], [place], [address] Phone: [phone]   Fax:  [phone]  Physical Therapy Treatment  Patient Details  Name: [name] MRN: [id number] Date of Birth: [birth year] No Data Recorded  Encounter Date: 10/13/2015      PT End of Session - 10/14/15 0758    Visit Number 2  New episode of care for PT:  10/06/15   Number of Visits 17   Date for PT Re-Evaluation 12/06/15   Authorization Type [employer] PPO-Auth Required; G-code every 10th visit   PT Start Time 0850   PT Stop Time 0935   PT Time Calculation (min) 45 min   Equipment Utilized During Treatment Gait belt   Behavior During Therapy [facility] for tasks assessed/performed      Past Medical History  Diagnosis Date  . Parkinson's disease ([place])     dx'ed 15 years ago  . PTSD (post-traumatic stress disorder)   . Bradycardia   . Hypertension     treated with HCTZ  . Sleep apnea     doesn't use C-pap  . Varicose veins   . Cancer [facility]) 2013    skin cancer  . Arthritis   . History of chicken pox   . GERD (gastroesophageal reflux disease)   . Dysrhythmia     chronic slow heart rate  . Depression     ptsd  . History of kidney stones   . Headache(784.0)     tension headaches non recent  . Shortness of breath dyspnea     Past Surgical History  Procedure Laterality Date  . Cyst removed       from lip as a child  . Skin cancer removed      from ears,   12 lft arm  rt leg 15  . Lumbar laminectomy/decompression microdiscectomy Bilateral 12/14/2012    Procedure: Bilateral lumbar three-four, four-five decompressive laminotomy/foraminotomy;  Surgeon: Charlie Pitter, MD;  Location: [facility];  Service: Neurosurgery;  Laterality: Bilateral;  . Subthalamic stimulator insertion Bilateral 12/06/2013    Procedure: SUBTHALAMIC STIMULATOR INSERTION;  Surgeon: Erline Levine, MD;  Location: [place] [facility];  Service: Neurosurgery;  Laterality:  Bilateral;  Bilateral deep brain stimulator placement  . Pulse generator implant Bilateral 12/13/2013    Procedure: Bilateral implantable pulse generator placement;  Surgeon: Erline Levine, MD;  Location: [place] [facility];  Service: Neurosurgery;  Laterality: Bilateral;  Bilateral implantable pulse generator placement  . Cholecystectomy N/A 10/22/2014    Procedure: LAPAROSCOPIC CHOLECYSTECTOMY WITH INTRAOPERATIVE CHOLANGIOGRAM;  Surgeon: Dia Crawford III, MD;  Location: [facility] ORS;  Service: General;  Laterality: N/A;    There were no vitals filed for this visit.      Subjective Assessment - 10/13/15 0853    Subjective To have CAT scan tomorrow for shoulder.  Had one fall into the couch.   Patient is accompained by: Family member  wife   Pertinent History Awaiting CT scan for R shoulder injury from fall; DBS bilaterally 2015; Adjustments of DBS through Dr. Carles Collet   Patient Stated Goals Pt's goal for therapy to be able to walk a straight line without falling.   Currently in Pain? Yes   Pain Score 9    Pain Location Shoulder   Pain Orientation Right   Pain Descriptors / Indicators Sharp   Pain Type Acute pain   Pain Onset 1 to 4 weeks ago   Pain Frequency Constant   Aggravating Factors  malpositioning   Pain  Relieving Factors Heat   Effect of Pain on Daily Activities Pt to have CT scan for shoulder-MD aware of pain                         [facility] Adult PT Treatment/Exercise - 10/14/15 0001    Transfers   Transfers Sit to Stand;Stand to Sit   Sit to Stand 4: Min guard;5: Supervision;Without upper extremity assist;From elevated surface;From chair/3-in-1;Other (comment)  from 22", 18" and 17" chairs   Sit to Stand Details (indicate cue type and reason) Therapist provides cues for increased forward lean, which assists patient in decreasing retropulsion tendency with transfers.   Stand to Sit 4: Min guard;5: Supervision;Without upper extremity assist;To elevated surface;To  chair/3-in-1;Other (comment)  22", 18", 17" surfaces   Number of Reps 10 reps;Other sets (comment)  3 sets from varied height chairs   Comments Practiced short distance stepping and turning to sit in various chairs, to fully perform each movement in sequence with deliberate movement patterns.  Practiced lateral weightshifting upon standing as means to initially find balance upon standing before starting short distance gait.  Also practiced stagger stance forward/back weightshifting as means of improved weightshifting to decrease incidence of posterior LOB in static standing.           PWR [facility]) - 10/13/15 [id number]    PWR! exercises Moves in sitting;Moves in standing   PWR! Up x 10  chair in front for support   PWR! Rock x 10  through hips only due to R shoulder pain   PWR! Twist --   PWR Step x 10  intermittent UE support at chair   Comments Verbal, visual cues from therapist for correct technique and deliberate movements of exercise   PWR! Up x 5 with coordinated UEs, then modified x 10 with upright posture (to avoid increased R shoulder pain)   PWR! Rock x 10 modified to perform without UEs-rocking laterally through hips only due to R shoulder pain   PWR! Step x 10 to alternating sides; cues for deliberate movement, deliberate full foot placement and sequencing with marching.   Comments Pt requires verbal, visual cues of therapist performing as well as tactile cues for correct technique of exercises.             PT Education - 10/14/15 0757    Education provided Yes   Education Details HEP initiated=-PWR! Moves in sitting and standing; transfer technique   Person(s) Educated Patient;Spouse   Methods Explanation;Demonstration;Verbal cues;Handout   Comprehension Verbalized understanding;Returned demonstration;Need further instruction;Verbal cues required          PT Short Term Goals - 10/07/15 1033    PT SHORT TERM GOAL #1   Title Pt will perform HEP independently for  improved balance, transfers, and gait.  TARGET 11/05/15   Time 4   Period Weeks   Status New   PT SHORT TERM GOAL #2   Title Pt will improve TUG score to less than or equal to 13.5 seconds for decreased fall risk.   Time 4   Period Weeks   Status New   PT SHORT TERM GOAL #3   Title Pt will improve 5x sit<>stand to less than or equal to 14 seconds for decreased fall risk.   Time 4   Period Weeks   Status New   PT SHORT TERM GOAL #4   Title Pt will improve Functional Gait ASsessment to at least 14/30 for decreased fall risk.  Time 4   Period Weeks   Status New   PT SHORT TERM GOAL #5   Title Pt will verbalize understanding of local Parkinson's resources.   Time 4   Period Weeks   Status New           PT Long Term Goals - 10/07/15 1036    PT LONG TERM GOAL #1   Title Pt will verbalize understanding of fall prevention/tips to reduce freezing with gait in home environment.  TARGET 12/06/15   Time 8   Period Weeks   Status New   PT LONG TERM GOAL #2   Title Pt will improve TUG manual to less than or equal to 15 seconds for decreased fall risk.   Time 8   Period Weeks   Status New   PT LONG TERM GOAL #3   Title Pt will perform at least 8 of 10 reps of sit<>stand transfers from less than 18 inch height, independently and no posterior lean or LOB, for improved transfer efficiency and safety.   Time 8   Period Weeks   Status New   PT LONG TERM GOAL #4   Title Pt will improve Functional GAit Assessment to at least 19/30 for decreased fall risk.   Time 8   Period Weeks   PT LONG TERM GOAL #5   Title Pt will verbalize understanding of ongoing community fitness options upon D/C from PT.   Time 8   Period Weeks   Status New               Plan - 10/14/15 [id number]    Clinical Impression Statement Treatment session focused on initiating PWR! Moves in sitting and standing for HEP (slightly modified due to R shoulder pain), with pt needing multi-modal cues for deliberate,  large amplitude movement patterns.  Problem-solved strategies for improved functional mobility at home:  transfers, intiiating gait and turns to sit.  Pt requires frequent verbal cues for reminders.  Pt will continue to benefit from further skilled PT to address posture, balance, gait and transfers.   Rehab Potential Good   PT Frequency 2x / week  1x/wk for 1 week, then   PT Duration 8 weeks  plus eval   PT Treatment/Interventions ADLs/Self Care Home Management;Therapeutic exercise;Therapeutic activities;Functional mobility training;Gait training;Stair training;DME Instruction;Balance training;Neuromuscular re-education;Patient/family education   PT Next Visit Plan Review PWR! Moves ([facility]) for HEP in sitting and standing; transfer training, gait training with turns   Consulted and Agree with Plan of Care Patient;Family member/caregiver   Family Member Consulted wife      Patient will benefit from skilled therapeutic intervention in order to improve the following deficits and impairments:  Abnormal gait, Decreased activity tolerance, Decreased balance, Decreased mobility, Decreased strength, Difficulty walking, Postural dysfunction  Visit Diagnosis: Other abnormalities of gait and mobility  Unsteadiness on feet     Problem List Patient Active Problem List   Diagnosis Date Noted  . Colon cancer screening 07/23/2015  . Screening examination for infectious disease 07/23/2015  . Gout 02/18/2015  . Depression 01/06/2015  . Irritation of eyelid 08/20/2014  . Dupuytren's contracture 08/20/2014  . Cough 07/21/2014  . Lumbar stenosis with neurogenic claudication 06/13/2014  . Preop cardiovascular exam 05/28/2014  . S/P deep brain stimulator placement 05/08/2014  . PD (Parkinson's disease) ([place]) 05/08/2014  . Joint pain 01/22/2014  . Medicare annual wellness visit, initial 01/19/2014  . Advance care planning 01/19/2014  . Parkinson's disease ([place]) 12/13/2013  .  PTSD (post-traumatic stress disorder) 06/13/2013  . Erectile dysfunction 06/13/2013  . HLD (hyperlipidemia) 06/13/2013  . Hip pain 06/10/2013  . Pain in joint, shoulder region 06/10/2013  . Right leg swelling 06/03/2013  . Essential hypertension 06/03/2013  . Bradycardia by electrocardiogram 06/03/2013  . Obstructive sleep apnea 03/12/2013  . Spinal stenosis, lumbar region, with neurogenic claudication 12/14/2012    Neka Bise W. 10/14/2015, 8:01 AM Frazier Butt., PT [facility] [address] [place], [place], [address] Phone: [phone]   Fax:  [phone]  Name: [name] MRN: [id number] Date of Birth: [birth year]

## 2015-10-15 ENCOUNTER — Ambulatory Visit: Payer: Medicare PPO | Admitting: Physical Therapy

## 2015-10-15 DIAGNOSIS — R29818 Other symptoms and signs involving the nervous system: Secondary | ICD-10-CM | POA: Diagnosis not present

## 2015-10-15 DIAGNOSIS — R2681 Unsteadiness on feet: Secondary | ICD-10-CM

## 2015-10-15 DIAGNOSIS — R2689 Other abnormalities of gait and mobility: Secondary | ICD-10-CM

## 2015-10-15 NOTE — Therapy (Signed)
[facility] [address] [place], [place], [address] Phone: [phone]   Fax:  [phone]  Physical Therapy Treatment  Patient Details  Name: [name] MRN: [id number] Date of Birth: 05-07-1950 No Data Recorded  Encounter Date: 10/15/2015      PT End of Session - 10/15/15 1101    Visit Number 3  New episode of care for PT:  10/06/15   Number of Visits 17   Date for PT Re-Evaluation 12/06/15   Authorization Type [employer] PPO-Auth Required; G-code every 10th visit   PT Start Time [phone]   PT Stop Time 0930   PT Time Calculation (min) 41 min   Equipment Utilized During Treatment Gait belt   Behavior During Therapy [name] for tasks assessed/performed      Past Medical History  Diagnosis Date  . Parkinson's disease ([place])     dx'ed 15 years ago  . PTSD (post-traumatic stress disorder)   . Bradycardia   . Hypertension     treated with HCTZ  . Sleep apnea     doesn't use C-pap  . Varicose veins   . Cancer [facility]) 2013    skin cancer  . Arthritis   . History of chicken pox   . GERD (gastroesophageal reflux disease)   . Dysrhythmia     chronic slow heart rate  . Depression     ptsd  . History of kidney stones   . Headache(784.0)     tension headaches non recent  . Shortness of breath dyspnea     Past Surgical History  Procedure Laterality Date  . Cyst removed       from lip as a child  . Skin cancer removed      from ears,   12 lft arm  rt leg 14  . Lumbar laminectomy/decompression microdiscectomy Bilateral 12/14/2012    Procedure: Bilateral lumbar three-four, four-five decompressive laminotomy/foraminotomy;  Surgeon: Charlie Pitter, MD;  Location: [place] [facility];  Service: Neurosurgery;  Laterality: Bilateral;  . Subthalamic stimulator insertion Bilateral 12/06/2013    Procedure: SUBTHALAMIC STIMULATOR INSERTION;  Surgeon: Erline Levine, MD;  Location: [facility] [facility];  Service: Neurosurgery;  Laterality:  Bilateral;  Bilateral deep brain stimulator placement  . Pulse generator implant Bilateral 12/13/2013    Procedure: Bilateral implantable pulse generator placement;  Surgeon: Erline Levine, MD;  Location: [name] [facility];  Service: Neurosurgery;  Laterality: Bilateral;  Bilateral implantable pulse generator placement  . Cholecystectomy N/A 10/22/2014    Procedure: LAPAROSCOPIC CHOLECYSTECTOMY WITH INTRAOPERATIVE CHOLANGIOGRAM;  Surgeon: Dia Crawford III, MD;  Location: [facility] ORS;  Service: General;  Laterality: N/A;    There were no vitals filed for this visit.      Subjective Assessment - 10/15/15 0850    Subjective Have not gotten results back from shoulder scan.  No falls since PT last time.   Patient is accompained by: Family member  wife   Pertinent History Awaiting CT scan for R shoulder injury from fall; DBS bilaterally 2015; Adjustments of DBS through Dr. Carles Collet   Patient Stated Goals Pt's goal for therapy to be able to walk a straight line without falling.   Currently in Pain? Yes   Pain Score 4    Pain Location Shoulder   Pain Orientation Right   Pain Descriptors / Indicators Sharp   Pain Onset 1 to 4 weeks ago   Pain Frequency Constant   Aggravating Factors  malpositioning-any movements other than shoulder flexion   Pain  Relieving Factors rest, doesn't hurt to lift shoulder forward                         [facility] Adult PT Treatment/Exercise - 10/15/15 0001    Transfers   Transfers Sit to Stand;Stand to Sit   Sit to Stand 4: Min guard;5: Supervision;Without upper extremity assist;From elevated surface;From chair/3-in-1;Other (comment)  from 22", 18", 17", and 14" surfaces   Sit to Stand Details (indicate cue type and reason) Occasional verbal cues for increased forward lean during transfers; pt able to recognize when he hasn't leaned forward enough   Stand to Sit 5: Supervision   Number of Reps 10 reps;Other sets (comment)  4 sets from varied height chairs/surfaces    Comments Practiced and discussed low surface transfers, as pt is doing a lot of squatting to pick up objects from the floor for packing to move.  He tends to fall to R or back.  Corrected pt's squat technique of having weight too far forward and going up on toes with deep squats.  Instead, had patient practice low squats, keeping weight back through heels, picking up cones x 5 reps, no LOB noted.   Ambulation/Gait   Ambulation/Gait Yes   Ambulation/Gait Assistance 5: Supervision   Ambulation/Gait Assistance Details Short bouts of gait with starts and stops to practice stagger stance stopping to maintain balance with quick stops and to decrease posterior LOB.   Ambulation Distance (Feet) 230 Feet   Assistive device None   Gait Pattern Step-through pattern;Decreased step length - right;Decreased step length - left;Decreased trunk rotation;Narrow base of support;Decreased arm swing - right;Poor foot clearance - left;Poor foot clearance - right  improves arm swing with cues   Ambulation Surface Level;Indoor           PWR [facility]) - 10/15/15 [id number]    PWR! exercises Moves in sitting;Moves in standing   PWR! Up x 20   PWR! Rock x 20  through hips only due to shoulder pain   PWR Step x 20  legs only due to shoulder pain   Comments Verbal, visual cues for technique, large, deliberate movement patterns   PWR! Up x 20  forward lean>upright posture due to shoulder pain   PWR! Rock x 20 lateral weightshift through hips only   PWR! Step x 20 reps alternating legs with cues for deliberate movement patterns-pt needs frequent breaks to reset sequence and pace.   Comments Pt requires visual and verbal cues for proper technqiue, large and deliberate movement patterns.          Balance Exercises - 10/15/15 0912    Balance Exercises: Standing   Wall Bumps Hip   Wall Bumps-Hips Anterior/posterior;Eyes opened;15 reps  UE support at chair   Heel Raises Limitations x 10  Heel/toe raises with chair  support   Toe Raise Limitations x 10   Other Standing Exercises At counter, stagger standing position anterior/posterior rocking and weightshifting           PT Education - 10/15/15 1100    Education provided Yes   Education Details Proper and safer transfer technique; also suggested use of low stool for sitting to pack boxes   Person(s) Educated Patient   Methods Explanation;Demonstration   Comprehension Verbalized understanding;Returned demonstration          PT Short Term Goals - 10/07/15 1033    PT SHORT TERM GOAL #1   Title Pt will perform HEP independently for improved  balance, transfers, and gait.  TARGET 11/05/15   Time 4   Period Weeks   Status New   PT SHORT TERM GOAL #2   Title Pt will improve TUG score to less than or equal to 13.5 seconds for decreased fall risk.   Time 4   Period Weeks   Status New   PT SHORT TERM GOAL #3   Title Pt will improve 5x sit<>stand to less than or equal to 14 seconds for decreased fall risk.   Time 4   Period Weeks   Status New   PT SHORT TERM GOAL #4   Title Pt will improve Functional Gait ASsessment to at least 14/30 for decreased fall risk.   Time 4   Period Weeks   Status New   PT SHORT TERM GOAL #5   Title Pt will verbalize understanding of local Parkinson's resources.   Time 4   Period Weeks   Status New           PT Long Term Goals - 10/07/15 1036    PT LONG TERM GOAL #1   Title Pt will verbalize understanding of fall prevention/tips to reduce freezing with gait in home environment.  TARGET 12/06/15   Time 8   Period Weeks   Status New   PT LONG TERM GOAL #2   Title Pt will improve TUG manual to less than or equal to 15 seconds for decreased fall risk.   Time 8   Period Weeks   Status New   PT LONG TERM GOAL #3   Title Pt will perform at least 8 of 10 reps of sit<>stand transfers from less than 18 inch height, independently and no posterior lean or LOB, for improved transfer efficiency and safety.   Time 8    Period Weeks   Status New   PT LONG TERM GOAL #4   Title Pt will improve Functional GAit Assessment to at least 19/30 for decreased fall risk.   Time 8   Period Weeks   PT LONG TERM GOAL #5   Title Pt will verbalize understanding of ongoing community fitness options upon D/C from PT.   Time 8   Period Weeks   Status New               Plan - 10/15/15 1106    Clinical Impression Statement Pt appears to have improved control of exercises this visit, but with quick movements and with head turns with gait, pt experiences LOB, which he is able to recover.  Still awaiting word from shoulder scan to know how to proceed with coordination of UE activities for Parkinson's specific exercises as well as core exercises.  Pt reports tips and strategies given last visit for functional activities at home were helpful.  Pt will continue to benefit from further skilled PT to address posture, balance, gait and transfers.   Rehab Potential Good   PT Frequency 2x / week  1x/wk for 1 week, then   PT Duration 8 weeks  plus eval   PT Treatment/Interventions ADLs/Self Care Home Management;Therapeutic exercise;Therapeutic activities;Functional mobility training;Gait training;Stair training;DME Instruction;Balance training;Neuromuscular re-education;Patient/family education   PT Next Visit Plan Low surfac transfer training and squatting, gait training with turns; start/stops, changes of directions, and incorporate head turns with gait/standing activities for balance; try resisted walking ?   Consulted and Agree with Plan of Care Patient;Family member/caregiver   Family Member Consulted wife      Patient will benefit from skilled therapeutic intervention  in order to improve the following deficits and impairments:  Abnormal gait, Decreased activity tolerance, Decreased balance, Decreased mobility, Decreased strength, Difficulty walking, Postural dysfunction  Visit Diagnosis: Other abnormalities of gait  and mobility  Unsteadiness on feet  Other symptoms and signs involving the nervous system     Problem List Patient Active Problem List   Diagnosis Date Noted  . Colon cancer screening 07/23/2015  . Screening examination for infectious disease 07/23/2015  . Gout 02/18/2015  . Depression 01/06/2015  . Irritation of eyelid 08/20/2014  . Dupuytren's contracture 08/20/2014  . Cough 07/21/2014  . Lumbar stenosis with neurogenic claudication 06/13/2014  . Preop cardiovascular exam 05/28/2014  . S/P deep brain stimulator placement 05/08/2014  . PD (Parkinson's disease) ([place]) 05/08/2014  . Joint pain 01/22/2014  . Medicare annual wellness visit, initial 01/19/2014  . Advance care planning 01/19/2014  . Parkinson's disease ([place]) 12/13/2013  . PTSD (post-traumatic stress disorder) 06/13/2013  . Erectile dysfunction 06/13/2013  . HLD (hyperlipidemia) 06/13/2013  . Hip pain 06/10/2013  . Pain in joint, shoulder region 06/10/2013  . Right leg swelling 06/03/2013  . Essential hypertension 06/03/2013  . Bradycardia by electrocardiogram 06/03/2013  . Obstructive sleep apnea 03/12/2013  . Spinal stenosis, lumbar region, with neurogenic claudication 12/14/2012    Maile Linford W. 10/15/2015, 11:11 AM  Frazier Butt., PT  [facility] [address] [place], [place], [address] Phone: [phone]   Fax:  [phone]  Name: [name] MRN: [id number] Date of Birth: Jul 03, 1950

## 2015-10-19 ENCOUNTER — Other Ambulatory Visit: Payer: Self-pay

## 2015-10-21 ENCOUNTER — Ambulatory Visit: Payer: Medicare PPO | Attending: Neurology | Admitting: Rehabilitative and Restorative Service Providers"

## 2015-10-21 DIAGNOSIS — R2681 Unsteadiness on feet: Secondary | ICD-10-CM | POA: Insufficient documentation

## 2015-10-21 DIAGNOSIS — M6281 Muscle weakness (generalized): Secondary | ICD-10-CM

## 2015-10-21 DIAGNOSIS — R2689 Other abnormalities of gait and mobility: Secondary | ICD-10-CM | POA: Insufficient documentation

## 2015-10-21 DIAGNOSIS — R29818 Other symptoms and signs involving the nervous system: Secondary | ICD-10-CM | POA: Insufficient documentation

## 2015-10-21 DIAGNOSIS — R262 Difficulty in walking, not elsewhere classified: Secondary | ICD-10-CM | POA: Diagnosis not present

## 2015-10-21 NOTE — Therapy (Signed)
[facility] [address] [place], [place], [address] Phone: [phone]   Fax:  [phone]  Physical Therapy Treatment  Patient Details  Name: [name] MRN: [id number] Date of Birth: 1951/01/16 No Data Recorded  Encounter Date: 10/21/2015      PT End of Session - 10/21/15 [id number]    Visit Number 4  New episode of care for PT:  10/06/15   Number of Visits 17   Date for PT Re-Evaluation 12/06/15   Authorization Type [employer] PPO-Auth Required; G-code every 10th visit   PT Start Time 0850   PT Stop Time 0930   PT Time Calculation (min) 40 min   Equipment Utilized During Treatment Gait belt   Behavior During Therapy [facility] for tasks assessed/performed      Past Medical History  Diagnosis Date  . Parkinson's disease ([place])     dx'ed 15 years ago  . PTSD (post-traumatic stress disorder)   . Bradycardia   . Hypertension     treated with HCTZ  . Sleep apnea     doesn't use C-pap  . Varicose veins   . Cancer [facility]) 2013    skin cancer  . Arthritis   . History of chicken pox   . GERD (gastroesophageal reflux disease)   . Dysrhythmia     chronic slow heart rate  . Depression     ptsd  . History of kidney stones   . Headache(784.0)     tension headaches non recent  . Shortness of breath dyspnea     Past Surgical History  Procedure Laterality Date  . Cyst removed       from lip as a child  . Skin cancer removed      from ears,   12 lft arm  rt leg 15  . Lumbar laminectomy/decompression microdiscectomy Bilateral 12/14/2012    Procedure: Bilateral lumbar three-four, four-five decompressive laminotomy/foraminotomy;  Surgeon: Charlie Pitter, MD;  Location: [place] [facility] ORS;  Service: Neurosurgery;  Laterality: Bilateral;  . Subthalamic stimulator insertion Bilateral 12/06/2013    Procedure: SUBTHALAMIC STIMULATOR INSERTION;  Surgeon: Erline Levine, MD;  Location: [place] [facility] ORS;  Service: Neurosurgery;  Laterality: Bilateral;   Bilateral deep brain stimulator placement  . Pulse generator implant Bilateral 12/13/2013    Procedure: Bilateral implantable pulse generator placement;  Surgeon: Erline Levine, MD;  Location: [place] [facility] ORS;  Service: Neurosurgery;  Laterality: Bilateral;  Bilateral implantable pulse generator placement  . Cholecystectomy N/A 10/22/2014    Procedure: LAPAROSCOPIC CHOLECYSTECTOMY WITH INTRAOPERATIVE CHOLANGIOGRAM;  Surgeon: Dia Crawford III, MD;  Location: [facility] ORS;  Service: General;  Laterality: N/A;    There were no vitals filed for this visit.      Subjective Assessment - 10/21/15 0854    Subjective The patient's wife reports that he has not fallen nearly as much since beginning PT.  No falls this week at all.   The patient has questions about some of the exercises.    Patient is accompained by: Family member  spouse   Pertinent History Awaiting CT scan for R shoulder injury from fall; DBS bilaterally 2015; Adjustments of DBS through Dr. Carles Collet   Patient Stated Goals Pt's goal for therapy to be able to walk a straight line without falling.   Currently in Pain? Yes   Pain Score 4    Pain Location Shoulder   Pain Orientation Right   Pain Descriptors / Indicators Sharp   Pain Type Acute pain   Pain  Onset 1 to 4 weeks ago   Pain Frequency Constant   Aggravating Factors  movement R UE   Pain Relieving Factors improves with lifting arm over head                         [facility] Adult PT Treatment/Exercise - 10/21/15 1640    Ambulation/Gait   Ambulation/Gait Yes   Ambulation/Gait Assistance 6: Modified independent (Device/Increase time)   Ambulation Distance (Feet) 230 Feet   Stairs Yes   Stairs Assistance 5: Supervision   Stair Management Technique One rail Right;Alternating pattern   Number of Stairs 4  x 5 reps   Gait Comments backwards walking with CGA encouraging larger stride and consistent stride length between R/L sides; gait activities working on head motion while  tossing a ball vertically and passing a ball R<>L to therapist positioned posterior to patient.  Heel and toe walking with cues on bigger movements and adding ball overhead to work on core engagement during dynamic gait tasks.   Therapeutic Activites    Therapeutic Activities Other Therapeutic Activities   Other Therapeutic Activities Floor transfers discussing packing boxes and using a stool or ottoman to sit on.  Patient reports he would prefer to get onto the floor and we practiced floor<>stand using UEs on mat, pressing up from floor to mimic no UE support available through furniture at home.  PT provided CGA for safety and recommended sitting on low surface, but not getting up from floor in middle of room--needs UE support for safety.    Set-up environmental barrier to mimic stepping into/out of shower while holding onto UE support.  Patient performs well in clinic and feels that floor surface + slippery surface make task more challenging -may need to add compliant surface to increase difficulty of this task.   Neuro Re-ed    Neuro Re-ed Details  Performed standing alternating foot taps to 6" and 12" stairs.   Exercises   Exercises Other Exercises   Other Exercises  Performed step downs working on quad control from 6" and 12" surfaces stepping down posterior to step with CGA for safety.           PWR [facility]) - 10/21/15 1631    PWR! Up x 10 reps encouraging tall posture during transition to tall kneeling  quadriped to tall kneeling working on upright posture   Comments *discussed and then attempted hip extension to quadriped, however some mild discomfort noted in low back after 1-2 repetitions.   Worked on moving tall kneeling to 1/2 kneeling in order to work on floor transfers near mat surface for support with intermittent UE support.                PT Short Term Goals - 10/07/15 1033    PT SHORT TERM GOAL #1   Title Pt will perform HEP independently for improved balance,  transfers, and gait.  TARGET 11/05/15   Time 4   Period Weeks   Status New   PT SHORT TERM GOAL #2   Title Pt will improve TUG score to less than or equal to 13.5 seconds for decreased fall risk.   Time 4   Period Weeks   Status New   PT SHORT TERM GOAL #3   Title Pt will improve 5x sit<>stand to less than or equal to 14 seconds for decreased fall risk.   Time 4   Period Weeks   Status New   PT SHORT  TERM GOAL #4   Title Pt will improve Functional Gait ASsessment to at least 14/30 for decreased fall risk.   Time 4   Period Weeks   Status New   PT SHORT TERM GOAL #5   Title Pt will verbalize understanding of local Parkinson's resources.   Time 4   Period Weeks   Status New           PT Long Term Goals - 10/07/15 1036    PT LONG TERM GOAL #1   Title Pt will verbalize understanding of fall prevention/tips to reduce freezing with gait in home environment.  TARGET 12/06/15   Time 8   Period Weeks   Status New   PT LONG TERM GOAL #2   Title Pt will improve TUG manual to less than or equal to 15 seconds for decreased fall risk.   Time 8   Period Weeks   Status New   PT LONG TERM GOAL #3   Title Pt will perform at least 8 of 10 reps of sit<>stand transfers from less than 18 inch height, independently and no posterior lean or LOB, for improved transfer efficiency and safety.   Time 8   Period Weeks   Status New   PT LONG TERM GOAL #4   Title Pt will improve Functional GAit Assessment to at least 19/30 for decreased fall risk.   Time 8   Period Weeks   PT LONG TERM GOAL #5   Title Pt will verbalize understanding of ongoing community fitness options upon D/C from PT.   Time 8   Period Weeks   Status New               Plan - 10/21/15 1647    Clinical Impression Statement The patient is having less falls since beginning PT.  PT worked in quadriped position to begin session today due to patient requesting work on floor transfers.  Patient also reports stepping into  sower and getting out of bath tub challenging at home.   PT Treatment/Interventions ADLs/Self Care Home Management;Therapeutic exercise;Therapeutic activities;Functional mobility training;Gait training;Stair training;DME Instruction;Balance training;Neuromuscular re-education;Patient/family education   PT Next Visit Plan Getting out of tub/stepping into shower; dynamic gait (direction changes, starts/stops, forward/backwards, head motion, resisted walking); low surface transfer training/ squatting.   Consulted and Agree with Plan of Care Patient   Family Member Consulted wife      Patient will benefit from skilled therapeutic intervention in order to improve the following deficits and impairments:  Abnormal gait, Decreased activity tolerance, Decreased balance, Decreased mobility, Decreased strength, Difficulty walking, Postural dysfunction  Visit Diagnosis: Unsteadiness on feet  Other symptoms and signs involving the nervous system  Other abnormalities of gait and mobility  Muscle weakness (generalized)  Difficulty walking     Problem List Patient Active Problem List   Diagnosis Date Noted  . Colon cancer screening 07/23/2015  . Screening examination for infectious disease 07/23/2015  . Gout 02/18/2015  . Depression 01/06/2015  . Irritation of eyelid 08/20/2014  . Dupuytren's contracture 08/20/2014  . Cough 07/21/2014  . Lumbar stenosis with neurogenic claudication 06/13/2014  . Preop cardiovascular exam 05/28/2014  . S/P deep brain stimulator placement 05/08/2014  . PD (Parkinson's disease) ([place]) 05/08/2014  . Joint pain 01/22/2014  . Medicare annual wellness visit, initial 01/19/2014  . Advance care planning 01/19/2014  . Parkinson's disease ([place]) 12/13/2013  . PTSD (post-traumatic stress disorder) 06/13/2013  . Erectile dysfunction 06/13/2013  . HLD (hyperlipidemia) 06/13/2013  . Hip  pain 06/10/2013  . Pain in joint, shoulder region 06/10/2013  . Right leg swelling  06/03/2013  . Essential hypertension 06/03/2013  . Bradycardia by electrocardiogram 06/03/2013  . Obstructive sleep apnea 03/12/2013  . Spinal stenosis, lumbar region, with neurogenic claudication 12/14/2012    Turner, PT 10/21/2015, 4:51 PM  [facility] [address] [place], [place], [address] Phone: [phone]   Fax:  [phone]  Name: AMARA BILLEY MRN: [id number] Date of Birth: 17-Jan-1951

## 2015-10-22 ENCOUNTER — Ambulatory Visit (INDEPENDENT_AMBULATORY_CARE_PROVIDER_SITE_OTHER): Payer: Medicare PPO | Admitting: Psychology

## 2015-10-22 DIAGNOSIS — F321 Major depressive disorder, single episode, moderate: Secondary | ICD-10-CM | POA: Diagnosis not present

## 2015-10-23 ENCOUNTER — Ambulatory Visit: Payer: Medicare PPO | Admitting: Rehabilitative and Restorative Service Providers"

## 2015-10-23 DIAGNOSIS — R29818 Other symptoms and signs involving the nervous system: Secondary | ICD-10-CM

## 2015-10-23 DIAGNOSIS — M6281 Muscle weakness (generalized): Secondary | ICD-10-CM | POA: Diagnosis not present

## 2015-10-23 DIAGNOSIS — R2681 Unsteadiness on feet: Secondary | ICD-10-CM

## 2015-10-23 DIAGNOSIS — R262 Difficulty in walking, not elsewhere classified: Secondary | ICD-10-CM | POA: Diagnosis not present

## 2015-10-23 DIAGNOSIS — R2689 Other abnormalities of gait and mobility: Secondary | ICD-10-CM

## 2015-10-23 NOTE — Therapy (Signed)
[facility] [address] [place], [place], [address] Phone: [phone]   Fax:  [phone]  Physical Therapy Treatment  Patient Details  Name: [name] MRN: [id number] Date of Birth: 01/19/1951 No Data Recorded  Encounter Date: 10/23/2015      PT End of Session - 10/23/15 0945    Visit Number 5  New episode of care for PT:  10/06/15   Number of Visits 17   Date for PT Re-Evaluation 12/06/15   Authorization Type [employer] PPO-Auth Required; G-code every 10th visit   Authorization Time Period 5/6 authorized visits utilized   PT Start Time [phone]   PT Stop Time 0933   PT Time Calculation (min) 50 min   Equipment Utilized During Treatment Gait belt   Behavior During Therapy [facility] for tasks assessed/performed      Past Medical History  Diagnosis Date  . Parkinson's disease ([facility])     dx'ed 15 years ago  . PTSD (post-traumatic stress disorder)   . Bradycardia   . Hypertension     treated with HCTZ  . Sleep apnea     doesn't use C-pap  . Varicose veins   . Cancer [facility]) 2013    skin cancer  . Arthritis   . History of chicken pox   . GERD (gastroesophageal reflux disease)   . Dysrhythmia     chronic slow heart rate  . Depression     ptsd  . History of kidney stones   . Headache(784.0)     tension headaches non recent  . Shortness of breath dyspnea     Past Surgical History  Procedure Laterality Date  . Cyst removed       from lip as a child  . Skin cancer removed      from ears,   12 lft arm  rt leg 15  . Lumbar laminectomy/decompression microdiscectomy Bilateral 12/14/2012    Procedure: Bilateral lumbar three-four, four-five decompressive laminotomy/foraminotomy;  Surgeon: Temple Pacini, MD;  Location: [facility] NEURO ORS;  Service: Neurosurgery;  Laterality: Bilateral;  . Subthalamic stimulator insertion Bilateral 12/06/2013    Procedure: SUBTHALAMIC STIMULATOR INSERTION;  Surgeon: Maeola Harman, MD;  Location:  [facility] NEURO ORS;  Service: Neurosurgery;  Laterality: Bilateral;  Bilateral deep brain stimulator placement  . Pulse generator implant Bilateral 12/13/2013    Procedure: Bilateral implantable pulse generator placement;  Surgeon: Maeola Harman, MD;  Location: [facility] NEURO ORS;  Service: Neurosurgery;  Laterality: Bilateral;  Bilateral implantable pulse generator placement  . Cholecystectomy N/A 10/22/2014    Procedure: LAPAROSCOPIC CHOLECYSTECTOMY WITH INTRAOPERATIVE CHOLANGIOGRAM;  Surgeon: Tiney Rouge III, MD;  Location: [facility] ORS;  Service: General;  Laterality: N/A;    There were no vitals filed for this visit.      Subjective Assessment - 10/23/15 0842    Subjective The patient has not had any falls.  He reports he worked in the yard yesterday for hours.     Patient is accompained by: Family member  spouse   Pertinent History Awaiting CT scan for R shoulder injury from fall; DBS bilaterally 2015; Adjustments of DBS through Dr. Arbutus Leas   Patient Stated Goals Pt's goal for therapy to be able to walk a straight line without falling.   Currently in Pain? Yes   Pain Score 6    Pain Location Shoulder   Pain Orientation Right   Pain Descriptors / Indicators Sharp   Pain Type Acute pain   Pain Onset More than a month  ago   Pain Frequency Constant   Aggravating Factors  movement R UE   Pain Relieving Factors improves with lifting arm over head            [facility] PT Assessment - 10/23/15 0852    Transfers   Five time sit to stand comments  13.06 seconds (improved from 17.58 seconds).   Ambulation/Gait   Ambulation/Gait Yes   Ambulation/Gait Assistance 7: Independent   Ambulation/Gait Assistance Details on level surfaces without a device now   Ambulation Distance (Feet) 250 Feet   Assistive device None   Ambulation Surface Level   Stairs Yes   Stairs Assistance 6: Modified independent (Device/Increase time)   Stair Management Technique Two rails;Alternating pattern   Number of Stairs 4  x 3 reps    Gait Comments The patient ambulates in therapy today performing 180 degree turns and stepping over objects.  The patient demonstrates trunk posterior leaning and PT provides cues for core engagement using ball to reach overhead.   Standardized Balance Assessment   Standardized Balance Assessment Timed Up and Go Test   Timed Up and Go Test   Normal TUG (seconds) 12.19   TUG Comments Improved from initial evaluation score of 15.23 seconds.   Functional Gait  Assessment   Gait assessed  Yes   Gait Level Surface Walks 20 ft, slow speed, abnormal gait pattern, evidence for imbalance or deviates 10-15 in outside of the 12 in walkway width. Requires more than 7 sec to ambulate 20 ft.  7.09 sec   Change in Gait Speed Able to smoothly change walking speed without loss of balance or gait deviation. Deviate no more than 6 in outside of the 12 in walkway width.   Gait with Horizontal Head Turns Performs head turns smoothly with slight change in gait velocity (eg, minor disruption to smooth gait path), deviates 6-10 in outside 12 in walkway width, or uses an assistive device.   Gait with Vertical Head Turns Performs head turns with no change in gait. Deviates no more than 6 in outside 12 in walkway width.   Gait and Pivot Turn Pivot turns safely in greater than 3 sec and stops with no loss of balance, or pivot turns safely within 3 sec and stops with mild imbalance, requires small steps to catch balance.   Step Over Obstacle Is able to step over one shoe box (4.5 in total height) but must slow down and adjust steps to clear box safely. May require verbal cueing.   Gait with Narrow Base of Support Ambulates 7-9 steps.  completed 8 steps   Gait with Eyes Closed Walks 20 ft, slow speed, abnormal gait pattern, evidence for imbalance, deviates 10-15 in outside 12 in walkway width. Requires more than 9 sec to ambulate 20 ft.   Ambulating Backwards Walks 20 ft, uses assistive device, slower speed, mild gait  deviations, deviates 6-10 in outside 12 in walkway width.   Steps Alternating feet, must use rail.   Total Score 19   FGA comment: Improved FGA from 9/30 up to 19/30.  Patient demonstrates trunk weakness with posterior lean worse during turns.                       [facility] Adult PT Treatment/Exercise - 10/23/15 4193    Neuro Re-ed    Neuro Re-ed Details  The patient performed core stabilization activities seated on physioball with Bilat arms abducted to 90 degrees (no c/o shoulder pain in that  position).  The patient performed marching and alternating LE kicks and needs min A + tactile/verbal cues for upright postural positioning.  Patient also held ball overhead to perform slow marches for core engagement.  Standing on rocker board performed rolling a ball up/down wall in anterior direction in order to engage core muscles with anterior reaching tasks while performing balance activities.  Also performed PWR stepping in posterior/diagonal direction with reaching and core enagement.  STanding marching with intermittent holds to emphasize single leg stance control and core stability.                    PT Short Term Goals - 10/23/15 0945    PT SHORT TERM GOAL #1   Title Pt will perform HEP independently for improved balance, transfers, and gait.  TARGET 11/05/15   Baseline Patient has been instructed in HEP and reports performing regularly.  PT to recheck technique/performance to ensure progressing well.   Time 4   Period Weeks   Status On-going   PT SHORT TERM GOAL #2   Title Pt will improve TUG score to less than or equal to 13.5 seconds for decreased fall risk.   Baseline Improved from 15.23 seconds at eval to 12.19 seconds today.   Time 4   Period Weeks   Status Achieved   PT SHORT TERM GOAL #3   Title Pt will improve 5x sit<>stand to less than or equal to 14 seconds for decreased fall risk.   Baseline Improved from 17.58 seconds to 13.06 seconds.     Time 4   Period  Weeks   Status Achieved   PT SHORT TERM GOAL #4   Title Pt will improve Functional Gait ASsessment to at least 14/30 for decreased fall risk.   Baseline patient improved from 9/30 up to 19/30 demonstrating dec'd fall risk.   Time 4   Period Weeks   Status Achieved   PT SHORT TERM GOAL #5   Title Pt will verbalize understanding of local Parkinson's resources.   Time 4   Period Weeks   Status On-going           PT Long Term Goals - 10/07/15 1036    PT LONG TERM GOAL #1   Title Pt will verbalize understanding of fall prevention/tips to reduce freezing with gait in home environment.  TARGET 12/06/15   Time 8   Period Weeks   Status New   PT LONG TERM GOAL #2   Title Pt will improve TUG manual to less than or equal to 15 seconds for decreased fall risk.   Time 8   Period Weeks   Status New   PT LONG TERM GOAL #3   Title Pt will perform at least 8 of 10 reps of sit<>stand transfers from less than 18 inch height, independently and no posterior lean or LOB, for improved transfer efficiency and safety.   Time 8   Period Weeks   Status New   PT LONG TERM GOAL #4   Title Pt will improve Functional GAit Assessment to at least 19/30 for decreased fall risk.   Time 8   Period Weeks   PT LONG TERM GOAL #5   Title Pt will verbalize understanding of ongoing community fitness options upon D/C from PT.   Time 8   Period Weeks   Status New               Plan - 10/23/15 0947    Clinical Impression Statement  The patient met 3 STGs demonstrating progress on TUG, FGA, and 5 times sit<>stand that demonstrates a decreased fall risk.  The patient will benefit from continued physical therapy to address trunk instability, high level balance, and functional mobility to optimize current status.  The patient's wife reports significant reduction in # of falls with no falls reported since beginning physical therapy.    PT Frequency 2x / week   PT Duration 8 weeks  plus eval   PT  Treatment/Interventions ADLs/Self Care Home Management;Therapeutic exercise;Therapeutic activities;Functional mobility training;Gait training;Stair training;DME Instruction;Balance training;Neuromuscular re-education;Patient/family education   PT Next Visit Plan Getting out of tub/stepping into shower; dynamic gait (direction changes, starts/stops, forward/backwards, head motion, resisted walking); low surface transfer training/ squatting.  Core stabilization during balance tasks.   Consulted and Agree with Plan of Care Patient;Family member/caregiver   Family Member Consulted spouse      Patient will benefit from skilled therapeutic intervention in order to improve the following deficits and impairments:  Abnormal gait, Decreased activity tolerance, Decreased balance, Decreased mobility, Decreased strength, Difficulty walking, Postural dysfunction  Visit Diagnosis: Unsteadiness on feet  Other symptoms and signs involving the nervous system  Other abnormalities of gait and mobility  Muscle weakness (generalized)     Problem List Patient Active Problem List   Diagnosis Date Noted  . Colon cancer screening 07/23/2015  . Screening examination for infectious disease 07/23/2015  . Gout 02/18/2015  . Depression 01/06/2015  . Irritation of eyelid 08/20/2014  . Dupuytren's contracture 08/20/2014  . Cough 07/21/2014  . Lumbar stenosis with neurogenic claudication 06/13/2014  . Preop cardiovascular exam 05/28/2014  . S/P deep brain stimulator placement 05/08/2014  . PD (Parkinson's disease) ([place]) 05/08/2014  . Joint pain 01/22/2014  . Medicare annual wellness visit, initial 01/19/2014  . Advance care planning 01/19/2014  . Parkinson's disease ([place]) 12/13/2013  . PTSD (post-traumatic stress disorder) 06/13/2013  . Erectile dysfunction 06/13/2013  . HLD (hyperlipidemia) 06/13/2013  . Hip pain 06/10/2013  . Pain in joint, shoulder region 06/10/2013  . Right leg swelling 06/03/2013  .  Essential hypertension 06/03/2013  . Bradycardia by electrocardiogram 06/03/2013  . Obstructive sleep apnea 03/12/2013  . Spinal stenosis, lumbar region, with neurogenic claudication 12/14/2012    Jennifer Payes,PT 10/23/2015, 9:57 AM  [facility] [address] [place], [place], [address] Phone: [phone]   Fax:  [phone]  Name: THADDAEUS GRANJA MRN: [id number] Date of Birth: 17-Mar-1951

## 2015-10-26 ENCOUNTER — Other Ambulatory Visit: Payer: Self-pay | Admitting: Family Medicine

## 2015-10-26 MED ORDER — OXYCODONE-ACETAMINOPHEN 5-325 MG PO TABS: 1.0000 | ORAL_TABLET | Freq: Four times a day (QID) | ORAL | Status: DC | PRN

## 2015-10-26 NOTE — Telephone Encounter (Signed)
Wife advised.  Rx left at front desk for pick up.  

## 2015-10-26 NOTE — Telephone Encounter (Signed)
Printed.  Thanks.  

## 2015-10-26 NOTE — Telephone Encounter (Signed)
MyChart RF Request.  Last Filled:    40 tablet 0 09/15/2015  Last office visit:   09/15/15   Please advise.

## 2015-10-27 ENCOUNTER — Ambulatory Visit: Payer: Medicare PPO | Admitting: Physical Therapy

## 2015-10-27 DIAGNOSIS — R29818 Other symptoms and signs involving the nervous system: Secondary | ICD-10-CM | POA: Diagnosis not present

## 2015-10-27 DIAGNOSIS — M6281 Muscle weakness (generalized): Secondary | ICD-10-CM | POA: Diagnosis not present

## 2015-10-27 DIAGNOSIS — R2689 Other abnormalities of gait and mobility: Secondary | ICD-10-CM | POA: Diagnosis not present

## 2015-10-27 DIAGNOSIS — R2681 Unsteadiness on feet: Secondary | ICD-10-CM | POA: Diagnosis not present

## 2015-10-27 DIAGNOSIS — R262 Difficulty in walking, not elsewhere classified: Secondary | ICD-10-CM | POA: Diagnosis not present

## 2015-10-28 NOTE — Therapy (Signed)
[facility] [address] [place], [place], [address] Phone: [phone]   Fax:  [phone]  Physical Therapy Treatment  Patient Details  Name: [name] MRN: [id number] Date of Birth: [birth year] No Data Recorded  Encounter Date: 10/27/2015      PT End of Session - 10/28/15 1235    Visit Number 6  New episode of care for PT:  10/06/15   Number of Visits 17   Date for PT Re-Evaluation 12/06/15   Authorization Type [employer] PPO-Auth Required; G-code every 10th visit   Authorization Time Period 6/6 initial visits utilized   PT Start Time 0848   PT Stop Time 0931   PT Time Calculation (min) 43 min   Equipment Utilized During Treatment Gait belt   Behavior During Therapy [facility] for tasks assessed/performed      Past Medical History  Diagnosis Date  . Parkinson's disease ([place])     dx'ed 15 years ago  . PTSD (post-traumatic stress disorder)   . Bradycardia   . Hypertension     treated with HCTZ  . Sleep apnea     doesn't use C-pap  . Varicose veins   . Cancer [facility]) 2013    skin cancer  . Arthritis   . History of chicken pox   . GERD (gastroesophageal reflux disease)   . Dysrhythmia     chronic slow heart rate  . Depression     ptsd  . History of kidney stones   . Headache(784.0)     tension headaches non recent  . Shortness of breath dyspnea     Past Surgical History  Procedure Laterality Date  . Cyst removed       from lip as a child  . Skin cancer removed      from ears,   12 lft arm  rt leg 15  . Lumbar laminectomy/decompression microdiscectomy Bilateral 12/14/2012    Procedure: Bilateral lumbar three-four, four-five decompressive laminotomy/foraminotomy;  Surgeon: Charlie Pitter, MD;  Location: [place] NEURO ORS;  Service: Neurosurgery;  Laterality: Bilateral;  . Subthalamic stimulator insertion Bilateral 12/06/2013    Procedure: SUBTHALAMIC STIMULATOR INSERTION;  Surgeon: Erline Levine, MD;  Location: [place]  NEURO ORS;  Service: Neurosurgery;  Laterality: Bilateral;  Bilateral deep brain stimulator placement  . Pulse generator implant Bilateral 12/13/2013    Procedure: Bilateral implantable pulse generator placement;  Surgeon: Erline Levine, MD;  Location: [place] NEURO ORS;  Service: Neurosurgery;  Laterality: Bilateral;  Bilateral implantable pulse generator placement  . Cholecystectomy N/A 10/22/2014    Procedure: LAPAROSCOPIC CHOLECYSTECTOMY WITH INTRAOPERATIVE CHOLANGIOGRAM;  Surgeon: Dia Crawford III, MD;  Location: [facility] ORS;  Service: General;  Laterality: N/A;    There were no vitals filed for this visit.      Subjective Assessment - 10/27/15 0850    Subjective Still no falls.  Haven't yet heard from orthopedist regarding shoulder CT scan.   Patient is accompained by: Family member  spouse   Pertinent History Awaiiting results fromCT scan for R shoulder injury from fall; DBS bilaterally 2015; Adjustments of DBS through Dr. Carles Collet   Patient Stated Goals Pt's goal for therapy to be able to walk a straight line without falling.   Currently in Pain? Yes   Pain Score 6    Pain Location Shoulder   Pain Orientation Right   Pain Descriptors / Indicators Sharp   Pain Onset More than a month ago   Pain Frequency Constant   Aggravating Factors  movement  in RUE   Pain Relieving Factors improves wifh lifting arm overhead                         [facility] Adult PT Treatment/Exercise - 10/28/15 0001    Ambulation/Gait   Ambulation/Gait Yes   Ambulation/Gait Assistance 7: Independent   Ambulation/Gait Assistance Details Gait activities in gym area, including starts/stops, forward/back walking with cues for widened BOS upon stopping.  Gait carrying weighted ball overhead x 110 ft with cues for abdominal activation for neutral upright posture.     Ambulation Distance (Feet) 250 Feet   Assistive device None   Ambulation Surface Level;Indoor   Stairs Yes   Stairs Assistance 6: Modified independent  (Device/Increase time)   Stair Management Technique Two rails;Alternating pattern   Number of Stairs 4  3 reps   Gait Comments With stair negotiation, discussed and practiced full placement of foot on step ascending the stairs, as well as need to activate abdominals prior to stair negotiation to avoid posterior lean.   High Level Balance   High Level Balance Activities Turns   High Level Balance Comments Practiced turns and changes of directions, including wide U-turns, clock turns, weigthshifting turns and step and go turns leading with foot into direction of the turn.     Neuro Re-ed    Neuro Re-ed Details  Forward step ups x 15 reps each side, coordinated with back step and reach at 6" step, with UE support.  Performed core stability activities going tall kneel>1/2 kneel x 5 reps each with 1 UE support at mat, cues for abdominal activation to keep trunk steady.  Attempted x 3 reps each leg with tall>half kneel while holding weighted ball-pt requires min/mod assist of therapist for upright trunk.                  PT Short Term Goals - 10/23/15 0945    PT SHORT TERM GOAL #1   Title Pt will perform HEP independently for improved balance, transfers, and gait.  TARGET 11/05/15   Baseline Patient has been instructed in HEP and reports performing regularly.  PT to recheck technique/performance to ensure progressing well.   Time 4   Period Weeks   Status On-going   PT SHORT TERM GOAL #2   Title Pt will improve TUG score to less than or equal to 13.5 seconds for decreased fall risk.   Baseline Improved from 15.23 seconds at eval to 12.19 seconds today.   Time 4   Period Weeks   Status Achieved   PT SHORT TERM GOAL #3   Title Pt will improve 5x sit<>stand to less than or equal to 14 seconds for decreased fall risk.   Baseline Improved from 17.58 seconds to 13.06 seconds.     Time 4   Period Weeks   Status Achieved   PT SHORT TERM GOAL #4   Title Pt will improve Functional Gait  ASsessment to at least 14/30 for decreased fall risk.   Baseline patient improved from 9/30 up to 19/30 demonstrating dec'd fall risk.   Time 4   Period Weeks   Status Achieved   PT SHORT TERM GOAL #5   Title Pt will verbalize understanding of local Parkinson's resources.   Time 4   Period Weeks   Status On-going           PT Long Term Goals - 10/07/15 1036    PT LONG TERM GOAL #1  Title Pt will verbalize understanding of fall prevention/tips to reduce freezing with gait in home environment.  TARGET 12/06/15   Time 8   Period Weeks   Status New   PT LONG TERM GOAL #2   Title Pt will improve TUG manual to less than or equal to 15 seconds for decreased fall risk.   Time 8   Period Weeks   Status New   PT LONG TERM GOAL #3   Title Pt will perform at least 8 of 10 reps of sit<>stand transfers from less than 18 inch height, independently and no posterior lean or LOB, for improved transfer efficiency and safety.   Time 8   Period Weeks   Status New   PT LONG TERM GOAL #4   Title Pt will improve Functional GAit Assessment to at least 19/30 for decreased fall risk.   Time 8   Period Weeks   PT LONG TERM GOAL #5   Title Pt will verbalize understanding of ongoing community fitness options upon D/C from PT.   Time 8   Period Weeks   Status New               Plan - 10/28/15 1237    Clinical Impression Statement Pt appears to have improved awareness of body position and trunk in space during gait and stair training activities today; however, with fatigue and with distraction, decreased trunk control noted.  Pt will continue to benefit from core stability/trunk control work in conjunction with functional activities for imrpoved gait and decreased fall risk.   PT Frequency 2x / week   PT Duration 8 weeks  plus eval   PT Treatment/Interventions ADLs/Self Care Home Management;Therapeutic exercise;Therapeutic activities;Functional mobility training;Gait training;Stair  training;DME Instruction;Balance training;Neuromuscular re-education;Patient/family education   PT Next Visit Plan Getting out of tub/stepping into shower; continue dynamic gait (direction changes, starts/stops, forward/backwards, head motion, resisted walking); low surface transfer training/ squatting.  Core stabilization during balance tasks.   Consulted and Agree with Plan of Care Patient;Family member/caregiver   Family Member Consulted spouse      Patient will benefit from skilled therapeutic intervention in order to improve the following deficits and impairments:  Abnormal gait, Decreased activity tolerance, Decreased balance, Decreased mobility, Decreased strength, Difficulty walking, Postural dysfunction  Visit Diagnosis: Other abnormalities of gait and mobility  Unsteadiness on feet     Problem List Patient Active Problem List   Diagnosis Date Noted  . Colon cancer screening 07/23/2015  . Screening examination for infectious disease 07/23/2015  . Gout 02/18/2015  . Depression 01/06/2015  . Irritation of eyelid 08/20/2014  . Dupuytren's contracture 08/20/2014  . Cough 07/21/2014  . Lumbar stenosis with neurogenic claudication 06/13/2014  . Preop cardiovascular exam 05/28/2014  . S/P deep brain stimulator placement 05/08/2014  . PD (Parkinson's disease) ([place]) 05/08/2014  . Joint pain 01/22/2014  . Medicare annual wellness visit, initial 01/19/2014  . Advance care planning 01/19/2014  . Parkinson's disease ([place]) 12/13/2013  . PTSD (post-traumatic stress disorder) 06/13/2013  . Erectile dysfunction 06/13/2013  . HLD (hyperlipidemia) 06/13/2013  . Hip pain 06/10/2013  . Pain in joint, shoulder region 06/10/2013  . Right leg swelling 06/03/2013  . Essential hypertension 06/03/2013  . Bradycardia by electrocardiogram 06/03/2013  . Obstructive sleep apnea 03/12/2013  . Spinal stenosis, lumbar region, with neurogenic claudication 12/14/2012    Akiba Melfi  W. 10/28/2015, 12:39 PM  Frazier Butt., PT  [facility] [address] [place], [place], [address]  Phone: [phone]   Fax:  [phone]  Name: [name] MRN: [id number] Date of Birth: Sep 30, 1950

## 2015-10-30 ENCOUNTER — Ambulatory Visit (INDEPENDENT_AMBULATORY_CARE_PROVIDER_SITE_OTHER)
Admission: RE | Admit: 2015-10-30 | Discharge: 2015-10-30 | Disposition: A | Discharge status: Home / Self Care | Payer: Medicare PPO | Source: Ambulatory Visit | Attending: Internal Medicine | Admitting: Internal Medicine

## 2015-10-30 ENCOUNTER — Ambulatory Visit: Payer: Medicare PPO | Admitting: Rehabilitative and Restorative Service Providers"

## 2015-10-30 ENCOUNTER — Encounter: Payer: Self-pay | Admitting: Internal Medicine

## 2015-10-30 ENCOUNTER — Ambulatory Visit (INDEPENDENT_AMBULATORY_CARE_PROVIDER_SITE_OTHER): Payer: Medicare PPO | Admitting: Internal Medicine

## 2015-10-30 VITALS — BP 100/70 | HR 50 | Temp 98.1°F | Wt 183.0 lb

## 2015-10-30 DIAGNOSIS — R2681 Unsteadiness on feet: Secondary | ICD-10-CM | POA: Diagnosis not present

## 2015-10-30 DIAGNOSIS — R2689 Other abnormalities of gait and mobility: Secondary | ICD-10-CM

## 2015-10-30 DIAGNOSIS — R262 Difficulty in walking, not elsewhere classified: Secondary | ICD-10-CM | POA: Diagnosis not present

## 2015-10-30 DIAGNOSIS — M79642 Pain in left hand: Secondary | ICD-10-CM

## 2015-10-30 DIAGNOSIS — S62632A Displaced fracture of distal phalanx of right middle finger, initial encounter for closed fracture: Secondary | ICD-10-CM | POA: Diagnosis not present

## 2015-10-30 DIAGNOSIS — S62609A Fracture of unspecified phalanx of unspecified finger, initial encounter for closed fracture: Secondary | ICD-10-CM | POA: Diagnosis not present

## 2015-10-30 DIAGNOSIS — R29818 Other symptoms and signs involving the nervous system: Secondary | ICD-10-CM

## 2015-10-30 DIAGNOSIS — M6281 Muscle weakness (generalized): Secondary | ICD-10-CM | POA: Diagnosis not present

## 2015-10-30 NOTE — Assessment & Plan Note (Signed)
Trauma with fall yesterday Swelling and tenderness at 3rd and 4th PIPs

## 2015-10-30 NOTE — Assessment & Plan Note (Signed)
Findings at PIP but also distal phalanx of 3rd Radiologist didn't find 4th finger fracture (but I thought I saw one) Finger splint applied with coban--4th finger buddy splinted given the pain there 3rd finger splinted in full extension (had been partially flexed)

## 2015-10-30 NOTE — Progress Notes (Signed)
Subjective:    Patient ID: [name], male    DOB: [birth year], 65 y.o.   MRN: [id number]  HPI Here due to finger injury With wife  [place] yesterday--doesn't remember what happened Thinks he came down on the left hand Left 3rd and 4th fingers are swollen and painful  Also fell 2 days ago Redness in right elbow--not sure which fall caused this Pain in right shoulder--this is not new  Current Outpatient Prescriptions on File Prior to Visit  Medication Sig Dispense Refill  . Artificial Tear Ointment (DRY EYES OP) Apply to eye.    Marland Kitchen buPROPion (WELLBUTRIN XL) 150 MG 24 hr tablet TAKE 3 TABLETS DAILY 270 tablet 1  . carbidopa-levodopa (SINEMET IR) 25-100 MG per tablet Take by mouth. 2 AM/ 1 afternoon/ 1 evening    . cholecalciferol (VITAMIN D) 1000 UNITS tablet Take 1,000 Units by mouth daily.    Marland Kitchen escitalopram (LEXAPRO) 10 MG tablet Take 1 tablet (10 mg total) by mouth daily. 90 tablet 1  . fluticasone (FLONASE) 50 MCG/ACT nasal spray     . hydrochlorothiazide (HYDRODIURIL) 25 MG tablet TAKE 1 TABLET DAILY 90 tablet 3  . naproxen (NAPROSYN) 500 MG tablet Take 500 mg by mouth 2 (two) times daily with a meal.    . oxyCODONE-acetaminophen (PERCOCET/ROXICET) 5-325 MG tablet Take 1-2 tablets by mouth every 6 (six) hours as needed for severe pain (sedation caution). 40 tablet 0  . pravastatin (PRAVACHOL) 20 MG tablet TAKE 1 TABLET EVERY EVENING 90 tablet 3   No current facility-administered medications on file prior to visit.    Allergies  Allergen Reactions  . Klonopin [Clonazepam] Other (See Comments)    Worsening mood/depression  . Morphine And Related     hallucinations    Past Medical History  Diagnosis Date  . Parkinson's disease ([place])     dx'ed 15 years ago  . PTSD (post-traumatic stress disorder)   . Bradycardia   . Hypertension     treated with HCTZ  . Sleep apnea     doesn't use C-pap  . Varicose veins   . Cancer [facility]) 2013    skin cancer  . Arthritis   .  History of chicken pox   . GERD (gastroesophageal reflux disease)   . Dysrhythmia     chronic slow heart rate  . Depression     ptsd  . History of kidney stones   . Headache(784.0)     tension headaches non recent  . Shortness of breath dyspnea     Past Surgical History  Procedure Laterality Date  . Cyst removed       from lip as a child  . Skin cancer removed      from ears,   12 lft arm  rt leg 15  . Lumbar laminectomy/decompression microdiscectomy Bilateral 12/14/2012    Procedure: Bilateral lumbar three-four, four-five decompressive laminotomy/foraminotomy;  Surgeon: Charlie Pitter, MD;  Location: [place] NEURO ORS;  Service: Neurosurgery;  Laterality: Bilateral;  . Subthalamic stimulator insertion Bilateral 12/06/2013    Procedure: SUBTHALAMIC STIMULATOR INSERTION;  Surgeon: Erline Levine, MD;  Location: [place] NEURO ORS;  Service: Neurosurgery;  Laterality: Bilateral;  Bilateral deep brain stimulator placement  . Pulse generator implant Bilateral 12/13/2013    Procedure: Bilateral implantable pulse generator placement;  Surgeon: Erline Levine, MD;  Location: [place] NEURO ORS;  Service: Neurosurgery;  Laterality: Bilateral;  Bilateral implantable pulse generator placement  . Cholecystectomy N/A 10/22/2014    Procedure: LAPAROSCOPIC CHOLECYSTECTOMY WITH  INTRAOPERATIVE CHOLANGIOGRAM;  Surgeon: Dia Crawford III, MD;  Location: [facility] ORS;  Service: General;  Laterality: N/A;    Family History  Problem Relation Age of Onset  . Colon cancer Neg Hx   . Prostate cancer Neg Hx   . Lung cancer Father     Social History   Social History  . Marital Status: Married    Spouse Name: N/A  . Number of Children: N/A  . Years of Education: N/A   Occupational History  . disabled     2001, [employer]  .      search and rescue helicopter   Social History Main Topics  . Smoking status: Never Smoker   . Smokeless tobacco: Former Systems developer  . Alcohol Use: 0.0 oz/week    0 Standard drinks or equivalent per week      Comment: occasional (twice per month)  . Drug Use: No  . Sexual Activity: No   Other Topics Concern  . 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    Married 2014   Review of Systems Minor scrapes on left forehead No headache Appetite is okay    Objective:   Physical Exam  Musculoskeletal:  Right elbow appears to have a diffuse purplish appearance--but no tenderness and normal ROM (?contusion)  Left 3rd and 4th fingers bruised and swollen with sig tenderness esp at PIP MCPs are quiet as is hand DIP not really tender          Assessment & Plan:

## 2015-10-30 NOTE — Therapy (Signed)
[facility] [address] [place], [place], [address] Phone: [phone]   Fax:  [phone]  Physical Therapy Treatment  Patient Details  Name: [name] MRN: [id number] Date of Birth: January 17, 1951 No Data Recorded  Encounter Date: 10/30/2015      PT End of Session - 10/30/15 1205    Visit Number 7  New episode of care for PT:  10/06/15   Number of Visits 17   Date for PT Re-Evaluation 12/06/15   Authorization Type [employer] PPO-Auth Required; G-code every 10th visit   Authorization Time Period 6/6 initial visits utilized   PT Start Time 0845   PT Stop Time 0930   PT Time Calculation (min) 45 min   Equipment Utilized During Treatment Gait belt   Behavior During Therapy [facility] for tasks assessed/performed      Past Medical History  Diagnosis Date  . Parkinson's disease ([place])     dx'ed 15 years ago  . PTSD (post-traumatic stress disorder)   . Bradycardia   . Hypertension     treated with HCTZ  . Sleep apnea     doesn't use C-pap  . Varicose veins   . Cancer [facility]) 2013    skin cancer  . Arthritis   . History of chicken pox   . GERD (gastroesophageal reflux disease)   . Dysrhythmia     chronic slow heart rate  . Depression     ptsd  . History of kidney stones   . Headache(784.0)     tension headaches non recent  . Shortness of breath dyspnea     Past Surgical History  Procedure Laterality Date  . Cyst removed       from lip as a child  . Skin cancer removed      from ears,   12 lft arm  rt leg 15  . Lumbar laminectomy/decompression microdiscectomy Bilateral 12/14/2012    Procedure: Bilateral lumbar three-four, four-five decompressive laminotomy/foraminotomy;  Surgeon: Charlie Pitter, MD;  Location: [place] NEURO ORS;  Service: Neurosurgery;  Laterality: Bilateral;  . Subthalamic stimulator insertion Bilateral 12/06/2013    Procedure: SUBTHALAMIC STIMULATOR INSERTION;  Surgeon: Erline Levine, MD;  Location: [place]  NEURO ORS;  Service: Neurosurgery;  Laterality: Bilateral;  Bilateral deep brain stimulator placement  . Pulse generator implant Bilateral 12/13/2013    Procedure: Bilateral implantable pulse generator placement;  Surgeon: Erline Levine, MD;  Location: [place] NEURO ORS;  Service: Neurosurgery;  Laterality: Bilateral;  Bilateral implantable pulse generator placement  . Cholecystectomy N/A 10/22/2014    Procedure: LAPAROSCOPIC CHOLECYSTECTOMY WITH INTRAOPERATIVE CHOLANGIOGRAM;  Surgeon: Dia Crawford III, MD;  Location: [facility] ORS;  Service: General;  Laterality: N/A;    There were no vitals filed for this visit.      Subjective Assessment - 10/30/15 0849    Subjective The patient had 2 hard falls this week.  One occurred while getting cereal out of cabinet and turning (could not control balance), and then fell yesterday after getting up from seated position.  Paitent took 2 percocets due to pain in R elbow, L hand and 2 small lacerations on L side of face (near eye).  The patient also hit the back of his head,--no headache reported.    Patient is accompained by: Family member  spouse   Patient Stated Goals Pt's goal for therapy to be able to walk a straight line without falling.   Currently in Pain? Yes   Pain Score 8    Pain  Location --  L hand, R shoulder, R elbow, R leg   Pain Descriptors / Indicators Sharp;Aching   Pain Type Acute pain   Pain Onset In the past 7 days   Pain Frequency Constant   Aggravating Factors  pain aggravated by recent trauma from fall   Pain Relieving Factors medications     NEUROMUSCULAR RE-EDUCATION: Physioball sitting with visual cues (mirror) for upright posture  Marching on ball with CGA resting every 2-3 reps to find midline (leans posteriorly) LE extension on ball with min A with cues to maintain upright position Standing lateral weight shifting with toe taps and then wide marching  THERAPEUTIC EXERCISE: Seated postural control working on upright trunk position  and sitting chin tucks with gentle manual pressure for tactile cues on motion Supine chin tucks with manual resistance to encourage correct motion at neck and verbal cues for lengthening through posterior aspect of neck Gentle ROM neck in supine with passive overpressure in tolerable range  Gait: Ambulation with SPC with cues on proper use of cane (with demo, and verbal cues) x 230 feet recommending more deliberate use of cane during L swing phase      PT Short Term Goals - 10/23/15 0945    PT SHORT TERM GOAL #1   Title Pt will perform HEP independently for improved balance, transfers, and gait.  TARGET 11/05/15   Baseline Patient has been instructed in HEP and reports performing regularly.  PT to recheck technique/performance to ensure progressing well.   Time 4   Period Weeks   Status On-going   PT SHORT TERM GOAL #2   Title Pt will improve TUG score to less than or equal to 13.5 seconds for decreased fall risk.   Baseline Improved from 15.23 seconds at eval to 12.19 seconds today.   Time 4   Period Weeks   Status Achieved   PT SHORT TERM GOAL #3   Title Pt will improve 5x sit<>stand to less than or equal to 14 seconds for decreased fall risk.   Baseline Improved from 17.58 seconds to 13.06 seconds.     Time 4   Period Weeks   Status Achieved   PT SHORT TERM GOAL #4   Title Pt will improve Functional Gait ASsessment to at least 14/30 for decreased fall risk.   Baseline patient improved from 9/30 up to 19/30 demonstrating dec'd fall risk.   Time 4   Period Weeks   Status Achieved   PT SHORT TERM GOAL #5   Title Pt will verbalize understanding of local Parkinson's resources.   Time 4   Period Weeks   Status On-going           PT Long Term Goals - 10/07/15 1036    PT LONG TERM GOAL #1   Title Pt will verbalize understanding of fall prevention/tips to reduce freezing with gait in home environment.  TARGET 12/06/15   Time 8   Period Weeks   Status New   PT LONG TERM GOAL  #2   Title Pt will improve TUG manual to less than or equal to 15 seconds for decreased fall risk.   Time 8   Period Weeks   Status New   PT LONG TERM GOAL #3   Title Pt will perform at least 8 of 10 reps of sit<>stand transfers from less than 18 inch height, independently and no posterior lean or LOB, for improved transfer efficiency and safety.   Time 8   Period Weeks  Status New   PT LONG TERM GOAL #4   Title Pt will improve Functional GAit Assessment to at least 19/30 for decreased fall risk.   Time 8   Period Weeks   PT LONG TERM GOAL #5   Title Pt will verbalize understanding of ongoing community fitness options upon D/C from PT.   Time 8   Period Weeks   Status New               Plan - 10/30/15 1206    Clinical Impression Statement The patient has had a decline in status over the last 2 days.  He has had 2 serious falls at home with injury to left hand, right elbow, right leg and continued pain in right shoulder.  Patient being evaluated later today due to falls (from MD).  PT focused today's session on trunk and postural control and weight shifting in standing.   PT Treatment/Interventions ADLs/Self Care Home Management;Therapeutic exercise;Therapeutic activities;Functional mobility training;Gait training;Stair training;DME Instruction;Balance training;Neuromuscular re-education;Patient/family education   PT Next Visit Plan Check on how MD visit went after falls. trunk control, dynamic gait, low surface transfers, getting into/out of tub/shower, posture.   Consulted and Agree with Plan of Care Patient;Family member/caregiver   Family Member Consulted spouse      Patient will benefit from skilled therapeutic intervention in order to improve the following deficits and impairments:  Abnormal gait, Decreased activity tolerance, Decreased balance, Decreased mobility, Decreased strength, Difficulty walking, Postural dysfunction  Visit Diagnosis: Other abnormalities of gait  and mobility  Unsteadiness on feet  Other symptoms and signs involving the nervous system  Muscle weakness (generalized)     Problem List Patient Active Problem List   Diagnosis Date Noted  . Colon cancer screening 07/23/2015  . Screening examination for infectious disease 07/23/2015  . Gout 02/18/2015  . Depression 01/06/2015  . Irritation of eyelid 08/20/2014  . Dupuytren's contracture 08/20/2014  . Cough 07/21/2014  . Lumbar stenosis with neurogenic claudication 06/13/2014  . Preop cardiovascular exam 05/28/2014  . S/P deep brain stimulator placement 05/08/2014  . PD (Parkinson's disease) ([place]) 05/08/2014  . Joint pain 01/22/2014  . Medicare annual wellness visit, initial 01/19/2014  . Advance care planning 01/19/2014  . Parkinson's disease ([place]) 12/13/2013  . PTSD (post-traumatic stress disorder) 06/13/2013  . Erectile dysfunction 06/13/2013  . HLD (hyperlipidemia) 06/13/2013  . Hip pain 06/10/2013  . Pain in joint, shoulder region 06/10/2013  . Right leg swelling 06/03/2013  . Essential hypertension 06/03/2013  . Bradycardia by electrocardiogram 06/03/2013  . Obstructive sleep apnea 03/12/2013  . Spinal stenosis, lumbar region, with neurogenic claudication 12/14/2012    [employer], PT 10/30/2015, 12:08 PM  [facility] [address] [place], [place], [address] Phone: [phone]   Fax:  [phone]  Name: [name] MRN: [id number] Date of Birth: 1950/08/19

## 2015-10-30 NOTE — Progress Notes (Signed)
Pre visit review using our clinic review tool, if applicable. No additional management support is needed unless otherwise documented below in the visit note. 

## 2015-11-04 ENCOUNTER — Ambulatory Visit: Payer: Medicare PPO | Admitting: Physical Therapy

## 2015-11-04 DIAGNOSIS — R2681 Unsteadiness on feet: Secondary | ICD-10-CM

## 2015-11-04 DIAGNOSIS — M6281 Muscle weakness (generalized): Secondary | ICD-10-CM | POA: Diagnosis not present

## 2015-11-04 DIAGNOSIS — R29818 Other symptoms and signs involving the nervous system: Secondary | ICD-10-CM | POA: Diagnosis not present

## 2015-11-04 DIAGNOSIS — R262 Difficulty in walking, not elsewhere classified: Secondary | ICD-10-CM | POA: Diagnosis not present

## 2015-11-04 DIAGNOSIS — R2689 Other abnormalities of gait and mobility: Secondary | ICD-10-CM

## 2015-11-04 NOTE — Patient Instructions (Signed)
It is important to avoid accidents which may result in broken bones.  Here are a few ideas on how to make your home safer so you will be less likely to trip or fall.  1. Use nonskid mats or non slip strips in your shower or tub, on your bathroom floor and around sinks.  If you know that you have spilled water, wipe it up! 2. In the bathroom, it is important to have properly installed grab bars on the walls or on the edge of the tub.  Towel racks are NOT strong enough for you to hold onto or to pull on for support. 3. Stairs and hallways should have enough light.  Add lamps or night lights if you need ore light. 4. It is good to have handrails on both sides of the stairs if possible.  Always fix broken handrails right away. 5. It is important to see the edges of steps.  Paint the edges of outdoor steps white so you can see them better.  Put colored tape on the edge of inside steps. 6. Throw-rugs are dangerous because they can slide.  Removing the rugs is the best idea, but if they must stay, add adhesive carpet tape to prevent slipping. 7. Do not keep things on stairs or in the halls.  Remove small furniture that blocks the halls as it may cause you to trip.  Keep telephone and electrical cords out of the way where you walk. 8. Always were sturdy, rubber-soled shoes for good support.  Never wear just socks, especially on the stairs.  Socks may cause you to slip or fall.  Do not wear full-length housecoats as you can easily trip on the bottom.  9. Place the things you use the most on the shelves that are the easiest to reach.  If you use a stepstool, make sure it is in good condition.  If you feel unsteady, DO NOT climb, ask for help. 10. If a health professional advises you to use a cane or walker, do not be ashamed.  These items can keep you from falling and breaking your bones. 

## 2015-11-04 NOTE — Therapy (Signed)
[facility] [address] [place], [place], [address] Phone: [phone]   Fax:  [phone]  Physical Therapy Treatment  Patient Details  Name: [name] MRN: [id number] Date of Birth: 1950-12-31 No Data Recorded  Encounter Date: 11/04/2015      PT End of Session - 11/04/15 2118    Visit Number 8  New episode of care for PT:  10/06/15   Number of Visits 17   Date for PT Re-Evaluation 12/06/15   Authorization Type [employer] PPO-Auth Required; G-code every 10th visit   Authorization Time Period 6/6 initial visits utilized; 3 additional visit from 10/27/15-12/11/15   Authorization - Visit Number 2   Authorization - Number of Visits 3   PT Start Time 0848   PT Stop Time 0930   PT Time Calculation (min) 42 min   Equipment Utilized During Treatment Gait belt   Behavior During Therapy [facility] for tasks assessed/performed      Past Medical History  Diagnosis Date  . Parkinson's disease ([place])     dx'ed 15 years ago  . PTSD (post-traumatic stress disorder)   . Bradycardia   . Hypertension     treated with HCTZ  . Sleep apnea     doesn't use C-pap  . Varicose veins   . Cancer [facility]) 2013    skin cancer  . Arthritis   . History of chicken pox   . GERD (gastroesophageal reflux disease)   . Dysrhythmia     chronic slow heart rate  . Depression     ptsd  . History of kidney stones   . Headache(784.0)     tension headaches non recent  . Shortness of breath dyspnea     Past Surgical History  Procedure Laterality Date  . Cyst removed       from lip as a child  . Skin cancer removed      from ears,   12 lft arm  rt leg 15  . Lumbar laminectomy/decompression microdiscectomy Bilateral 12/14/2012    Procedure: Bilateral lumbar three-four, four-five decompressive laminotomy/foraminotomy;  Surgeon: Charlie Pitter, MD;  Location: [place] NEURO ORS;  Service: Neurosurgery;  Laterality: Bilateral;  . Subthalamic stimulator  insertion Bilateral 12/06/2013    Procedure: SUBTHALAMIC STIMULATOR INSERTION;  Surgeon: Erline Levine, MD;  Location: [place] NEURO ORS;  Service: Neurosurgery;  Laterality: Bilateral;  Bilateral deep brain stimulator placement  . Pulse generator implant Bilateral 12/13/2013    Procedure: Bilateral implantable pulse generator placement;  Surgeon: Erline Levine, MD;  Location: [place] NEURO ORS;  Service: Neurosurgery;  Laterality: Bilateral;  Bilateral implantable pulse generator placement  . Cholecystectomy N/A 10/22/2014    Procedure: LAPAROSCOPIC CHOLECYSTECTOMY WITH INTRAOPERATIVE CHOLANGIOGRAM;  Surgeon: Dia Crawford III, MD;  Location: [facility] ORS;  Service: General;  Laterality: N/A;    There were no vitals filed for this visit.      Subjective Assessment - 11/04/15 0851    Subjective Have had 4 falls since last visit-2 inside and 2 outside.  Fractured L middle finger and have it in finger splint until visit in 2 weeks.  Pt/wife verbalize no restrictions for L third finger.   Patient is accompained by: Family member  wife   Pertinent History Awaiiting results fromCT scan for R shoulder injury from fall; DBS bilaterally 2015; Adjustments of DBS through Dr. Carles Collet   Patient Stated Goals Pt's goal for therapy to be able to walk a straight line without falling.   Currently in Pain? Yes  Pain Score 8    Pain Location Shoulder   Pain Orientation Right   Pain Descriptors / Indicators Aching;Sharp   Pain Frequency Constant   Aggravating Factors  any movement other than raising straight ahead   Pain Relieving Factors medication   Pain Score 0  No pain at visit   Pain Location Finger (Comment which one)  L 3rd   Pain Orientation Left   Aggravating Factors  hurts to bend it   Pain Relieving Factors position in splint-MD aware and is monitoring                         [facility] Adult PT Treatment/Exercise - 11/04/15 0001    Transfers   Transfers Sit to Stand;Stand to Sit   Sit to Stand 4: Min  guard;5: Supervision;Without upper extremity assist;From elevated surface;From chair/3-in-1;Other (comment)   Sit to Stand Details Verbal cues for sequencing   Sit to Stand Details (indicate cue type and reason) With sit<>stand x 4 reps at U-step RW, pt requires verbal cues for hand placement, verbal cues for technique to fully turn and sit.   Stand to Sit 5: Supervision   Number of Reps 10 reps   Ambulation/Gait   Ambulation/Gait Yes   Ambulation/Gait Assistance 5: Supervision;4: Min guard   Ambulation/Gait Assistance Details Gait into therapy session, 100 ft, with cane, narrow BOS, near scissoring gait, with pt veering to R and L, needing min guard for safety.   Ambulation Distance (Feet) 400 Feet  then 200 ft outdoors   Assistive device Other (Comment)  U-step RW   Ambulation Surface Level;Indoor;Outdoor;Paved;Grass   Pre-Gait Activities Figure-8 turns with U-step RW with supervision, x 2 reps.   Gait Comments Gait trial on grass using U-step RW, with tendency to push walker too far forward and lose control of walker.  Pt needs assistance ands cues to stay closer to BOS of walker.  Ramp (incline/decline) negotiation x 6 reps outdoors using U-step RW, with cues for slowed pace.    Self-Care   Self-Care Other Self-Care Comments   Other Self-Care Comments  Discussed outdoor falls-pt unsure how he fell, other than falling into boat/trailer hitch.  Discussed fall prevention technqiues for home environment, including reviewing importance of pacing and slowed, deliberate movement patterns.  Discussed environmental factors, including pt's upcoming move, as factors in gait difficulties in the home. Discussed with wife need to eliminate distractions during gait, and ways wife can provide simple verbal cues for safety with gait.  Discussed need for use of U-step RW at this time for improved safety with gait, given pt's recent increase in falls in the past week.                  PT Education -  11/04/15 2115    Education provided Yes   Education Details Fall prevention, safety with gait, including need to use U-step RW for optimal safety.   Person(s) Educated Patient;Spouse   Methods Explanation;Demonstration;Handout   Comprehension Verbalized understanding;Verbal cues required          PT Short Term Goals - 10/23/15 0945    PT SHORT TERM GOAL #1   Title Pt will perform HEP independently for improved balance, transfers, and gait.  TARGET 11/05/15   Baseline Patient has been instructed in HEP and reports performing regularly.  PT to recheck technique/performance to ensure progressing well.   Time 4   Period Weeks   Status On-going   PT SHORT  TERM GOAL #2   Title Pt will improve TUG score to less than or equal to 13.5 seconds for decreased fall risk.   Baseline Improved from 15.23 seconds at eval to 12.19 seconds today.   Time 4   Period Weeks   Status Achieved   PT SHORT TERM GOAL #3   Title Pt will improve 5x sit<>stand to less than or equal to 14 seconds for decreased fall risk.   Baseline Improved from 17.58 seconds to 13.06 seconds.     Time 4   Period Weeks   Status Achieved   PT SHORT TERM GOAL #4   Title Pt will improve Functional Gait ASsessment to at least 14/30 for decreased fall risk.   Baseline patient improved from 9/30 up to 19/30 demonstrating dec'd fall risk.   Time 4   Period Weeks   Status Achieved   PT SHORT TERM GOAL #5   Title Pt will verbalize understanding of local Parkinson's resources.   Time 4   Period Weeks   Status On-going           PT Long Term Goals - 11/04/15 2129    PT LONG TERM GOAL #1   Title Pt will verbalize understanding of fall prevention/tips to reduce freezing with gait in home environment.  TARGET 12/06/15   Baseline Fall prevention initiated with education to pt and wife.  11/04/15   Time 8   Period Weeks   Status Partially Met   PT LONG TERM GOAL #2   Title Pt will improve TUG manual to less than or equal to 15  seconds for decreased fall risk.   Time 8   Period Weeks   Status On-going   PT LONG TERM GOAL #3   Title Pt will perform at least 8 of 10 reps of sit<>stand transfers from less than 18 inch height, independently and no posterior lean or LOB, for improved transfer efficiency and safety.   Time 8   Period Weeks   Status On-going   PT LONG TERM GOAL #4   Title Pt will improve Functional GAit Assessment to at least 19/30 for decreased fall risk.   Time 8   Period Weeks   Status On-going   PT LONG TERM GOAL #5   Title Pt will verbalize understanding of ongoing community fitness options upon D/C from PT.   Time 8   Period Weeks   Status On-going               Plan - 11/04/15 2122    Clinical Impression Statement Pt has had several additional falls since last PT visit, occurring outdoors.  Pt's gait with cane appears unsteady compared to previous visits.  Focused today's session on fall prevention and education on need to use U-step RW for increased safety at home.  Pt will benefit from additional instruction with U-Step RW use for safety, as pt has U-STep RW at home, but he does not use consistently.  Will also continue to benefit from  trunk/core strengthening and balance/gait and turning activities for improved mobility.   PT Treatment/Interventions ADLs/Self Care Home Management;Therapeutic exercise;Therapeutic activities;Functional mobility training;Gait training;Stair training;DME Instruction;Balance training;Neuromuscular re-education;Patient/family education   PT Next Visit Plan Trunk control, dynamic gait, low surface transfers, posture re-education activities.   Consulted and Agree with Plan of Care Patient;Family member/caregiver   Family Member Consulted spouse      Patient will benefit from skilled therapeutic intervention in order to improve the following deficits and impairments:  Abnormal gait, Decreased activity tolerance, Decreased balance, Decreased mobility,  Decreased strength, Difficulty walking, Postural dysfunction  Visit Diagnosis: Other abnormalities of gait and mobility  Unsteadiness on feet     Problem List Patient Active Problem List   Diagnosis Date Noted  . Left hand pain 10/30/2015  . Fracture, finger, multiple sites 10/30/2015  . Colon cancer screening 07/23/2015  . Screening examination for infectious disease 07/23/2015  . Gout 02/18/2015  . Depression 01/06/2015  . Irritation of eyelid 08/20/2014  . Dupuytren's contracture 08/20/2014  . Cough 07/21/2014  . Lumbar stenosis with neurogenic claudication 06/13/2014  . Preop cardiovascular exam 05/28/2014  . S/P deep brain stimulator placement 05/08/2014  . PD (Parkinson's disease) ([place]) 05/08/2014  . Joint pain 01/22/2014  . Medicare annual wellness visit, initial 01/19/2014  . Advance care planning 01/19/2014  . Parkinson's disease ([place]) 12/13/2013  . PTSD (post-traumatic stress disorder) 06/13/2013  . Erectile dysfunction 06/13/2013  . HLD (hyperlipidemia) 06/13/2013  . Hip pain 06/10/2013  . Pain in joint, shoulder region 06/10/2013  . Right leg swelling 06/03/2013  . Essential hypertension 06/03/2013  . Bradycardia by electrocardiogram 06/03/2013  . Obstructive sleep apnea 03/12/2013  . Spinal stenosis, lumbar region, with neurogenic claudication 12/14/2012    [name] W. 11/04/2015, 9:36 PM Frazier Butt., PT [facility] [address] [place], [place], [address] Phone: [phone]   Fax:  [phone]  Name: RIVALDO HINEMAN MRN: [id number] Date of Birth: October 28, 1950

## 2015-11-06 ENCOUNTER — Ambulatory Visit: Payer: Medicare PPO | Admitting: Rehabilitative and Restorative Service Providers"

## 2015-11-06 DIAGNOSIS — R2681 Unsteadiness on feet: Secondary | ICD-10-CM

## 2015-11-06 DIAGNOSIS — R29818 Other symptoms and signs involving the nervous system: Secondary | ICD-10-CM | POA: Diagnosis not present

## 2015-11-06 DIAGNOSIS — R262 Difficulty in walking, not elsewhere classified: Secondary | ICD-10-CM | POA: Diagnosis not present

## 2015-11-06 DIAGNOSIS — M6281 Muscle weakness (generalized): Secondary | ICD-10-CM

## 2015-11-06 DIAGNOSIS — R2689 Other abnormalities of gait and mobility: Secondary | ICD-10-CM | POA: Diagnosis not present

## 2015-11-06 NOTE — Therapy (Signed)
[facility] [address] [place], [place], [address] Phone: [phone]   Fax:  [phone]  Physical Therapy Treatment  Patient Details  Name: [name] MRN: [id number] Date of Birth: [birth year] No Data Recorded  Encounter Date: 11/06/2015      PT End of Session - 11/06/15 1221    Visit Number 9   Number of Visits 17   Date for PT Re-Evaluation 12/06/15   Authorization Type [employer] PPO-Auth Required; G-code every 10th visit   Authorization Time Period 6/6 initial visits utilized; 3 additional visit from 10/27/15-12/11/15   Authorization - Visit Number 3   Authorization - Number of Visits 3   PT Start Time 0845   PT Stop Time 0935   PT Time Calculation (min) 50 min   Equipment Utilized During Treatment Gait belt   Activity Tolerance Patient tolerated treatment well   Behavior During Therapy [facility] for tasks assessed/performed      Past Medical History  Diagnosis Date  . Parkinson's disease ([place])     dx'ed 15 years ago  . PTSD (post-traumatic stress disorder)   . Bradycardia   . Hypertension     treated with HCTZ  . Sleep apnea     doesn't use C-pap  . Varicose veins   . Cancer [facility]) 2013    skin cancer  . Arthritis   . History of chicken pox   . GERD (gastroesophageal reflux disease)   . Dysrhythmia     chronic slow heart rate  . Depression     ptsd  . History of kidney stones   . Headache(784.0)     tension headaches non recent  . Shortness of breath dyspnea     Past Surgical History  Procedure Laterality Date  . Cyst removed       from lip as a child  . Skin cancer removed      from ears,   12 lft arm  rt leg 14  . Lumbar laminectomy/decompression microdiscectomy Bilateral 12/14/2012    Procedure: Bilateral lumbar three-four, four-five decompressive laminotomy/foraminotomy;  Surgeon: Charlie Pitter, MD;  Location: [place] NEURO ORS;  Service: Neurosurgery;  Laterality: Bilateral;  . Subthalamic  stimulator insertion Bilateral 12/06/2013    Procedure: SUBTHALAMIC STIMULATOR INSERTION;  Surgeon: Erline Levine, MD;  Location: [place] NEURO ORS;  Service: Neurosurgery;  Laterality: Bilateral;  Bilateral deep brain stimulator placement  . Pulse generator implant Bilateral 12/13/2013    Procedure: Bilateral implantable pulse generator placement;  Surgeon: Erline Levine, MD;  Location: [place] NEURO ORS;  Service: Neurosurgery;  Laterality: Bilateral;  Bilateral implantable pulse generator placement  . Cholecystectomy N/A 10/22/2014    Procedure: LAPAROSCOPIC CHOLECYSTECTOMY WITH INTRAOPERATIVE CHOLANGIOGRAM;  Surgeon: Dia Crawford III, MD;  Location: [facility] ORS;  Service: General;  Laterality: N/A;    There were no vitals filed for this visit.      Subjective Assessment - 11/06/15 0918    Subjective The patient has not had further falls since laast session.  He is taking more pain medication than usual due to recent falls and L hand fx (see education section).  Patient brought 3 wheeled walker for PT to see.       SELF CARE/HOME MANAGEMENT: Fall prevention discussion (see education)  Gait: Ambulation with 3 wheeled walker and then 4 wheeled rollater.  Patient more steady with 4 wheels and has lateral losses of balance when using 3 wheeled. Another factor is that U-step walker remains in locked position and releases  when squeezing handles vs rollater (3 and 4 wheeled) RW work opposite- the patient's wife identifies that she feels this is a contributing factor to fall risk   NEUROMUSCULAR RE-EDUCATION: Reviewed seated PWR exercises with modifications due to R shoulder pain/discomfort with increased movement.      PT Education - 11/06/15 1218    Education provided Yes   Education Details Discussed fall factors emphasizing other possible contributing factors to falls.  Recommended patient speak with MD/pharmacist regarding scheduling of Sinemet.  He is currently not taking in a consistent manner (not same  time of day and sometimes with meals/sometimes without meals).  PT discussed use of pain medications as possible contributing factor to falls as he increased his pain medication intake after the first fall and then had subsequent falls.  Discussed PT's role/ meds not in scope, however these other factors influence his safety/fall risk.    Person(s) Educated Patient;Spouse   Methods Explanation   Comprehension Verbalized understanding          PT Short Term Goals - 11/06/15 0921    PT SHORT TERM GOAL #1   Title Pt will perform HEP independently for improved balance, transfers, and gait.  TARGET 11/05/15   Baseline Patient has been instructed in HEP and reports performing regularly.  PT to recheck technique/performance to ensure progressing well.   Time 4   Period Weeks   Status On-going   PT SHORT TERM GOAL #2   Title Pt will improve TUG score to less than or equal to 13.5 seconds for decreased fall risk.   Baseline Improved from 15.23 seconds at eval to 12.19 seconds today.   Time 4   Period Weeks   Status Achieved   PT SHORT TERM GOAL #3   Title Pt will improve 5x sit<>stand to less than or equal to 14 seconds for decreased fall risk.   Baseline Improved from 17.58 seconds to 13.06 seconds.     Time 4   Period Weeks   Status Achieved   PT SHORT TERM GOAL #4   Title Pt will improve Functional Gait ASsessment to at least 14/30 for decreased fall risk.   Baseline patient improved from 9/30 up to 19/30 demonstrating dec'd fall risk.   Time 4   Period Weeks   Status Achieved   PT SHORT TERM GOAL #5   Title Pt will verbalize understanding of local Parkinson's resources.   Baseline Met on 11/06/2015 per patient's wife report-currently going to [facility] intermittently.   Time 4   Period Weeks   Status Achieved           PT Long Term Goals - 11/04/15 2129    PT LONG TERM GOAL #1   Title Pt will verbalize understanding of fall prevention/tips to reduce freezing with gait in  home environment.  TARGET 12/06/15   Baseline Fall prevention initiated with education to pt and wife.  11/04/15   Time 8   Period Weeks   Status Partially Met   PT LONG TERM GOAL #2   Title Pt will improve TUG manual to less than or equal to 15 seconds for decreased fall risk.   Time 8   Period Weeks   Status On-going   PT LONG TERM GOAL #3   Title Pt will perform at least 8 of 10 reps of sit<>stand transfers from less than 18 inch height, independently and no posterior lean or LOB, for improved transfer efficiency and safety.   Time 8  Period Weeks   Status On-going   PT LONG TERM GOAL #4   Title Pt will improve Functional GAit Assessment to at least 19/30 for decreased fall risk.   Time 8   Period Weeks   Status On-going   PT LONG TERM GOAL #5   Title Pt will verbalize understanding of ongoing community fitness options upon D/C from PT.   Time 8   Period Weeks   Status On-going               Plan - 11/06/15 1222    Clinical Impression Statement Today's session focused on education to raise awareness of ways that the patient can reduce fall risk including how he is managing his Parkinson's medications and pain medications.  The patient's wife reports the patient has not been in a routine of taking Sinemet at regular times t/o the day.  PT discussed use of 3 wheeled RW vs a 4 wheeled RW.  He is much safer with 4 wheels.  PT discussed ways to participate in HEP even after the falls recommending returning to seated exercises.     PT Treatment/Interventions ADLs/Self Care Home Management;Therapeutic exercise;Therapeutic activities;Functional mobility training;Gait training;Stair training;DME Instruction;Balance training;Neuromuscular re-education;Patient/family education   PT Next Visit Plan *Awaiting further [employer] auth.  Review standing exercises and modify if needed due to recent falls; trunk control   Consulted and Agree with Plan of Care Patient;Family member/caregiver    Family Member Consulted spouse      Patient will benefit from skilled therapeutic intervention in order to improve the following deficits and impairments:  Abnormal gait, Decreased activity tolerance, Decreased balance, Decreased mobility, Decreased strength, Difficulty walking, Postural dysfunction  Visit Diagnosis: Other abnormalities of gait and mobility  Unsteadiness on feet  Other symptoms and signs involving the nervous system  Muscle weakness (generalized)     Problem List Patient Active Problem List   Diagnosis Date Noted  . Left hand pain 10/30/2015  . Fracture, finger, multiple sites 10/30/2015  . Colon cancer screening 07/23/2015  . Screening examination for infectious disease 07/23/2015  . Gout 02/18/2015  . Depression 01/06/2015  . Irritation of eyelid 08/20/2014  . Dupuytren's contracture 08/20/2014  . Cough 07/21/2014  . Lumbar stenosis with neurogenic claudication 06/13/2014  . Preop cardiovascular exam 05/28/2014  . S/P deep brain stimulator placement 05/08/2014  . PD (Parkinson's disease) ([place]) 05/08/2014  . Joint pain 01/22/2014  . Medicare annual wellness visit, initial 01/19/2014  . Advance care planning 01/19/2014  . Parkinson's disease ([place]) 12/13/2013  . PTSD (post-traumatic stress disorder) 06/13/2013  . Erectile dysfunction 06/13/2013  . HLD (hyperlipidemia) 06/13/2013  . Hip pain 06/10/2013  . Pain in joint, shoulder region 06/10/2013  . Right leg swelling 06/03/2013  . Essential hypertension 06/03/2013  . Bradycardia by electrocardiogram 06/03/2013  . Obstructive sleep apnea 03/12/2013  . Spinal stenosis, lumbar region, with neurogenic claudication 12/14/2012    [place], PT 11/06/2015, 12:29 PM  [facility] [address] [place], [place], [address] Phone: [phone]   Fax:  [phone]  Name: [name] MRN: [id number] Date of Birth: 1950/10/18

## 2015-11-11 ENCOUNTER — Ambulatory Visit (INDEPENDENT_AMBULATORY_CARE_PROVIDER_SITE_OTHER)
Admission: RE | Admit: 2015-11-11 | Discharge: 2015-11-11 | Disposition: A | Discharge status: Home / Self Care | Payer: Medicare PPO | Source: Ambulatory Visit | Attending: Family Medicine | Admitting: Family Medicine

## 2015-11-11 ENCOUNTER — Ambulatory Visit (INDEPENDENT_AMBULATORY_CARE_PROVIDER_SITE_OTHER): Payer: Medicare PPO | Admitting: Family Medicine

## 2015-11-11 ENCOUNTER — Ambulatory Visit: Payer: Medicare PPO | Admitting: Rehabilitative and Restorative Service Providers"

## 2015-11-11 ENCOUNTER — Encounter: Payer: Self-pay | Admitting: Family Medicine

## 2015-11-11 VITALS — BP 118/63 | HR 47 | Temp 98.3°F | Ht 69.0 in | Wt 181.5 lb

## 2015-11-11 DIAGNOSIS — S62609D Fracture of unspecified phalanx of unspecified finger, subsequent encounter for fracture with routine healing: Secondary | ICD-10-CM

## 2015-11-11 DIAGNOSIS — R29818 Other symptoms and signs involving the nervous system: Secondary | ICD-10-CM

## 2015-11-11 DIAGNOSIS — M6281 Muscle weakness (generalized): Secondary | ICD-10-CM | POA: Diagnosis not present

## 2015-11-11 DIAGNOSIS — R2681 Unsteadiness on feet: Secondary | ICD-10-CM

## 2015-11-11 DIAGNOSIS — R2689 Other abnormalities of gait and mobility: Secondary | ICD-10-CM | POA: Diagnosis not present

## 2015-11-11 DIAGNOSIS — S62633D Displaced fracture of distal phalanx of left middle finger, subsequent encounter for fracture with routine healing: Secondary | ICD-10-CM | POA: Diagnosis not present

## 2015-11-11 DIAGNOSIS — R262 Difficulty in walking, not elsewhere classified: Secondary | ICD-10-CM | POA: Diagnosis not present

## 2015-11-11 NOTE — Progress Notes (Signed)
Pre visit review using our clinic review tool, if applicable. No additional management support is needed unless otherwise documented below in the visit note. 

## 2015-11-11 NOTE — Progress Notes (Signed)
Dr. Frederico Hamman T. Anival Pasha, MD, [facility] [place], [address] Phone: [phone] Fax: [phone]  11/11/2015  Patient: [name], MRN: [id number], DOB: 1950/09/19, 65 y.o.  Primary Physician:  Elsie Stain, MD   Chief Complaint  Patient presents with  . Follow-up    Finger Fx   Subjective:   [name] is a 65 y.o. very pleasant male patient who presents with the following:  Left fourth digit fracture, extremely small DIP avulsion as well as a fracture at the base of the left middle phalanx.  Date of injury 10/29/2015.  He is doing well, and he is been compliant with wearing his brace. He did purchase a brace that allowed some movement at the [facility] over-the-counter.  Past Medical History, Surgical History, Social History, Family History, Problem List, Medications, and Allergies have been reviewed and updated if relevant.  Patient Active Problem List   Diagnosis Date Noted  . Left hand pain 10/30/2015  . Fracture, finger, multiple sites 10/30/2015  . Colon cancer screening 07/23/2015  . Screening examination for infectious disease 07/23/2015  . Gout 02/18/2015  . Depression 01/06/2015  . Irritation of eyelid 08/20/2014  . Dupuytren's contracture 08/20/2014  . Cough 07/21/2014  . Lumbar stenosis with neurogenic claudication 06/13/2014  . Preop cardiovascular exam 05/28/2014  . S/P deep brain stimulator placement 05/08/2014  . PD (Parkinson's disease) ([place]) 05/08/2014  . Joint pain 01/22/2014  . Medicare annual wellness visit, initial 01/19/2014  . Advance care planning 01/19/2014  . Parkinson's disease ([place]) 12/13/2013  . PTSD (post-traumatic stress disorder) 06/13/2013  . Erectile dysfunction 06/13/2013  . HLD (hyperlipidemia) 06/13/2013  . Hip pain 06/10/2013  . Pain in joint, shoulder region 06/10/2013  . Right leg swelling 06/03/2013  . Essential hypertension 06/03/2013  . Bradycardia by  electrocardiogram 06/03/2013  . Obstructive sleep apnea 03/12/2013  . Spinal stenosis, lumbar region, with neurogenic claudication 12/14/2012    Past Medical History:  Diagnosis Date  . Arthritis   . Bradycardia   . Cancer [facility]) 2013   skin cancer  . Depression    ptsd  . Dysrhythmia    chronic slow heart rate  . GERD (gastroesophageal reflux disease)   . Headache(784.0)    tension headaches non recent  . History of chicken pox   . History of kidney stones   . Hypertension    treated with HCTZ  . Parkinson's disease ([place])    dx'ed 15 years ago  . PTSD (post-traumatic stress disorder)   . Shortness of breath dyspnea   . Sleep apnea    doesn't use C-pap  . Varicose veins     Past Surgical History:  Procedure Laterality Date  . CHOLECYSTECTOMY N/A 10/22/2014   Procedure: LAPAROSCOPIC CHOLECYSTECTOMY WITH INTRAOPERATIVE CHOLANGIOGRAM;  Surgeon: Dia Crawford III, MD;  Location: [facility] ORS;  Service: General;  Laterality: N/A;  . cyst removed      from lip as a child  . LUMBAR LAMINECTOMY/DECOMPRESSION MICRODISCECTOMY Bilateral 12/14/2012   Procedure: Bilateral lumbar three-four, four-five decompressive laminotomy/foraminotomy;  Surgeon: Charlie Pitter, MD;  Location: [place] [facility];  Service: Neurosurgery;  Laterality: Bilateral;  . PULSE GENERATOR IMPLANT Bilateral 12/13/2013   Procedure: Bilateral implantable pulse generator placement;  Surgeon: Erline Levine, MD;  Location: [place] [facility];  Service: Neurosurgery;  Laterality: Bilateral;  Bilateral implantable pulse generator placement  . skin cancer removed     from ears,   12 lft arm  rt leg 15  . SUBTHALAMIC STIMULATOR INSERTION Bilateral 12/06/2013   Procedure: SUBTHALAMIC STIMULATOR INSERTION;  Surgeon: Erline Levine, MD;  Location: [place] [facility];  Service: Neurosurgery;  Laterality: Bilateral;  Bilateral deep brain stimulator placement    Social History   Social History  . Marital status: Married    Spouse name: N/A  . Number of  children: N/A  . Years of education: N/A   Occupational History  . disabled     2001, [employer]  .      search and rescue helicopter   Social History Main Topics  . Smoking status: Never Smoker  . Smokeless tobacco: Former Systems developer  . Alcohol use 0.0 oz/week     Comment: occasional (twice per month)  . Drug use: No  . Sexual activity: No   Other Topics Concern  . 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    Married 2014    Family History  Problem Relation Age of Onset  . Colon cancer Neg Hx   . Prostate cancer Neg Hx   . Lung cancer Father     Allergies  Allergen Reactions  . Klonopin [Clonazepam] Other (See Comments)    Worsening mood/depression  . Morphine And Related     hallucinations    Medication list reviewed and updated in full in [facility].  GEN: No fevers, chills. Nontoxic. Primarily MSK c/o today. MSK: Detailed in the HPI GI: tolerating PO intake without difficulty Neuro: No numbness, parasthesias, or tingling associated. Otherwise the pertinent positives of the ROS are noted above.   Objective:   BP 118/63   Pulse (!) 47   Temp 98.3 F (36.8 C) (Oral)   Ht 5\' 9"  (1.753 m)   Wt 181 lb 8 oz (82.3 kg)   BMI 26.80 kg/m    GEN: WDWN, NAD, Non-toxic, Alert & Oriented x 3 HEENT: Atraumatic, Normocephalic.  Ears and Nose: No external deformity. EXTR: No clubbing/cyanosis/edema NEURO: Normal gait.  PSYCH: Normally interactive. Conversant. Not depressed or anxious appearing.  Calm demeanor.    Left middle digit is tender to palpation at the DIP and PIP joints.  Flexor tendons are intact as well as extensor tendons.  Radiology: Dg Finger Middle Left  Result Date: 11/11/2015 CLINICAL DATA:  Follow-up finger fracture EXAM: LEFT MIDDLE FINGER 2+V COMPARISON:  None. FINDINGS: Healing fracture the base of the middle phalanx. Previously seen small bone  fragment at the base of the distal phalanx anteriorly no longer visualized. Note new fracture. No subluxation or dislocation. IMPRESSION: Healing fracture along the anterior base of the left middle finger middle phalanx. No additional bony abnormality. Electronically Signed   By: Rolm Baptise M.D.   On: 11/11/2015 11:57   Assessment and Plan:   Fracture, finger, multiple sites, with routine healing, subsequent encounter - Plan: DG Finger Middle Left  I would expect that he will do well. I will maintain immobilization of the PIP and DIP joints in his current splint. If he is more active and has to use his hand more, recommended buddy taping as well.  Follow-up: Return for f/u 2 1/2 weeks.  Orders Placed This Encounter  Procedures  . DG Finger Middle Left    Signed,  Demetrias Goodbar T. Aideen Fenster, MD   Patient's Medications  New Prescriptions   No medications on file  Previous Medications   ARTIFICIAL  TEAR OINTMENT (DRY EYES OP)    Apply to eye.   BUPROPION (WELLBUTRIN XL) 150 MG 24 HR TABLET    TAKE 3 TABLETS DAILY   CARBIDOPA-LEVODOPA (SINEMET IR) 25-100 MG PER TABLET    Take by mouth. 2 AM/ 1 afternoon/ 1 evening   CHOLECALCIFEROL (VITAMIN D) 1000 UNITS TABLET    Take 1,000 Units by mouth daily.   ESCITALOPRAM (LEXAPRO) 10 MG TABLET    Take 1 tablet (10 mg total) by mouth daily.   FLUTICASONE (FLONASE) 50 MCG/ACT NASAL SPRAY       HYDROCHLOROTHIAZIDE (HYDRODIURIL) 25 MG TABLET    TAKE 1 TABLET DAILY   NAPROXEN (NAPROSYN) 500 MG TABLET    Take 500 mg by mouth 2 (two) times daily with a meal.   OXYCODONE-ACETAMINOPHEN (PERCOCET/ROXICET) 5-325 MG TABLET    Take 1-2 tablets by mouth every 6 (six) hours as needed for severe pain (sedation caution).   PRAVASTATIN (PRAVACHOL) 20 MG TABLET    TAKE 1 TABLET EVERY EVENING  Modified Medications   No medications on file  Discontinued Medications   No medications on file

## 2015-11-12 NOTE — Therapy (Signed)
[facility] [address] [place], [place], [address] Phone: [phone]   Fax:  [phone]  Physical Therapy Treatment  Patient Details  Name: [name] MRN: [id number] Date of Birth: October 22, 1950 No Data Recorded  Encounter Date: 11/11/2015      PT End of Session - 11/11/15 1222    Visit Number 10   Number of Visits 17   Date for PT Re-Evaluation 12/06/15   Authorization Type [employer] PPO-Auth Required; G-code every 10th visit   Authorization Time Period 6/6 initial visits utilized; 3 additional visit from 10/27/15-12/11/15 *awaiting further auth   Equipment Utilized During Treatment Gait belt   Activity Tolerance Patient tolerated treatment well   Behavior During Therapy [facility] for tasks assessed/performed      Past Medical History:  Diagnosis Date  . Arthritis   . Bradycardia   . Cancer [facility]) 2013   skin cancer  . Depression    ptsd  . Dysrhythmia    chronic slow heart rate  . GERD (gastroesophageal reflux disease)   . Headache(784.0)    tension headaches non recent  . History of chicken pox   . History of kidney stones   . Hypertension    treated with HCTZ  . Parkinson's disease ([facility])    dx'ed 15 years ago  . PTSD (post-traumatic stress disorder)   . Shortness of breath dyspnea   . Sleep apnea    doesn't use C-pap  . Varicose veins     Past Surgical History:  Procedure Laterality Date  . CHOLECYSTECTOMY N/A 10/22/2014   Procedure: LAPAROSCOPIC CHOLECYSTECTOMY WITH INTRAOPERATIVE CHOLANGIOGRAM;  Surgeon: Tiney Rouge III, MD;  Location: [facility] ORS;  Service: General;  Laterality: N/A;  . cyst removed      from lip as a child  . LUMBAR LAMINECTOMY/DECOMPRESSION MICRODISCECTOMY Bilateral 12/14/2012   Procedure: Bilateral lumbar three-four, four-five decompressive laminotomy/foraminotomy;  Surgeon: Temple Pacini, MD;  Location: [facility];  Service: Neurosurgery;  Laterality: Bilateral;  . PULSE GENERATOR  IMPLANT Bilateral 12/13/2013   Procedure: Bilateral implantable pulse generator placement;  Surgeon: Maeola Harman, MD;  Location: [facility];  Service: Neurosurgery;  Laterality: Bilateral;  Bilateral implantable pulse generator placement  . skin cancer removed     from ears,   12 lft arm  rt leg 15  . SUBTHALAMIC STIMULATOR INSERTION Bilateral 12/06/2013   Procedure: SUBTHALAMIC STIMULATOR INSERTION;  Surgeon: Maeola Harman, MD;  Location: [facility];  Service: Neurosurgery;  Laterality: Bilateral;  Bilateral deep brain stimulator placement    There were no vitals filed for this visit.      Subjective Assessment - 11/11/15 0805    Subjective The patient has tried to take medications at same time each day and notes a marked improvement in mobility.  He is not using a device to ambulate into clinic and reports no falls since last PT session.    Patient is accompained by: Family member   Patient Stated Goals Pt's goal for therapy to be able to walk a straight line without falling.   Currently in Pain? Yes   Pain Score 5    Pain Location Shoulder   Pain Orientation Right   Pain Descriptors / Indicators Aching   Pain Type Acute pain   Pain Onset More than a month ago   Pain Frequency Constant   Aggravating Factors  any movement   Pain Relieving Factors medication       NEUROMUSCULAR RE-EDUCATION: Seated on physioball performing  postural upright with marching, UE reaching (within pain tolerable range R) and LE extension Review of HEP for PWR in standing-- see below      PWR [facility]) - 11/11/15 7062    PWR! Up x 10   PWR! Rock x 10   PWR Step x 10   Comments verbal and visual cues on tecnique.  Reviewed for HEP.       SELF CARE/HOME MANAGEMENT: Discussed community options for post d/c x 10 minutes including local parkinson's groups, [employer] opportunities, silver sneakers in his area (per below PT education) Patient ans his wife verbalize understanding of recommendations PT  discussed ongoing need for exercise post d/c from therapy Discussed insurance limitations and will f/u to educate [employer] on patient's decline in medical status after falling last week multiple times and fracturing hand       PT Education - 11/11/15 0854    Education provided Yes   Education Details Recommended return to sitting and standing exercises; reviewed community options for post d/c including: 1) power over parkinson's 2) parkinson's exercise group 3) silver sneakers (patient uses pool) 4) tai chi   5) Parkinson's recumbent bike class.  PT discussed need for ongoing exercise program to maintain current status.   Person(s) Educated Patient;Spouse   Methods Explanation   Comprehension Verbalized understanding          PT Short Term Goals - 11/12/15 1223      PT SHORT TERM GOAL #1   Title Pt will perform HEP independently for improved balance, transfers, and gait.  TARGET 11/05/15   Baseline Patient has been instructed in HEP and reports performing regularly.  PT to recheck technique/performance to ensure progressing well.   Time 4   Period Weeks   Status Achieved     PT SHORT TERM GOAL #2   Title Pt will improve TUG score to less than or equal to 13.5 seconds for decreased fall risk.   Baseline Improved from 15.23 seconds at eval to 12.19 seconds today.   Time 4   Period Weeks   Status Achieved     PT SHORT TERM GOAL #3   Title Pt will improve 5x sit<>stand to less than or equal to 14 seconds for decreased fall risk.   Baseline Improved from 17.58 seconds to 13.06 seconds.     Time 4   Period Weeks   Status Achieved     PT SHORT TERM GOAL #4   Title Pt will improve Functional Gait ASsessment to at least 14/30 for decreased fall risk.   Baseline patient improved from 9/30 up to 19/30 demonstrating dec'd fall risk.   Time 4   Period Weeks   Status Achieved     PT SHORT TERM GOAL #5   Title Pt will verbalize understanding of local Parkinson's resources.   Baseline  Met on 11/06/2015 per patient's wife report-currently going to [facility] intermittently.   Time 4   Period Weeks   Status Achieved           PT Long Term Goals - 11/04/15 2129      PT LONG TERM GOAL #1   Title Pt will verbalize understanding of fall prevention/tips to reduce freezing with gait in home environment.  TARGET 12/06/15   Baseline Fall prevention initiated with education to pt and wife.  11/04/15   Time 8   Period Weeks   Status Partially Met     PT LONG TERM GOAL #2   Title Pt will improve  TUG manual to less than or equal to 15 seconds for decreased fall risk.   Time 8   Period Weeks   Status On-going     PT LONG TERM GOAL #3   Title Pt will perform at least 8 of 10 reps of sit<>stand transfers from less than 18 inch height, independently and no posterior lean or LOB, for improved transfer efficiency and safety.   Time 8   Period Weeks   Status On-going     PT LONG TERM GOAL #4   Title Pt will improve Functional GAit Assessment to at least 19/30 for decreased fall risk.   Time 8   Period Weeks   Status On-going     PT LONG TERM GOAL #5   Title Pt will verbalize understanding of ongoing community fitness options upon D/C from PT.   Time 8   Period Weeks   Status On-going               Plan - 11/12/15 1223    Clinical Impression Statement The patient is able to return demo HEP meeting STG.  PT requesting further authorization from [facility].  The patient had a significant improvement today compared to past 2-3 visits.  Patient feels our discussion regarding consistency of medication usage and taking meds at same time each day have helped him make changes to improve mobility in the home.   Rehab Potential Good   PT Frequency 2x / week   PT Duration 4 weeks  for a total of 6 more visits   PT Treatment/Interventions ADLs/Self Care Home Management;Therapeutic exercise;Therapeutic activities;Functional mobility training;Gait training;Stair training;DME  Instruction;Balance training;Neuromuscular re-education;Patient/family education   PT Next Visit Plan awaiting further [employer] auth.; balance, trunk control   Consulted and Agree with Plan of Care Patient      Patient will benefit from skilled therapeutic intervention in order to improve the following deficits and impairments:  Abnormal gait, Decreased activity tolerance, Decreased balance, Decreased mobility, Decreased strength, Difficulty walking, Postural dysfunction  Visit Diagnosis: Other abnormalities of gait and mobility  Unsteadiness on feet  Other symptoms and signs involving the nervous system       G-Codes - Nov 20, 2015 1348    Functional Assessment Tool Used TUG=12.29 seconds; FGA 19/30   Functional Limitation Mobility: Walking and moving around   Mobility: Walking and Moving Around Current Status [phone]) At least 20 percent but less than 40 percent impaired, limited or restricted   Mobility: Walking and Moving Around Goal Status [phone]) At least 20 percent but less than 40 percent impaired, limited or restricted      Problem List Patient Active Problem List   Diagnosis Date Noted  . Left hand pain 10/30/2015  . Fracture, finger, multiple sites 10/30/2015  . Colon cancer screening 07/23/2015  . Screening examination for infectious disease 07/23/2015  . Gout 02/18/2015  . Depression 01/06/2015  . Irritation of eyelid 08/20/2014  . Dupuytren's contracture 08/20/2014  . Cough 07/21/2014  . Lumbar stenosis with neurogenic claudication 06/13/2014  . Preop cardiovascular exam 05/28/2014  . S/P deep brain stimulator placement 05/08/2014  . PD (Parkinson's disease) ([place]) 05/08/2014  . Joint pain 01/22/2014  . Medicare annual wellness visit, initial 01/19/2014  . Advance care planning 01/19/2014  . Parkinson's disease ([place]) 12/13/2013  . PTSD (post-traumatic stress disorder) 06/13/2013  . Erectile dysfunction 06/13/2013  . HLD (hyperlipidemia) 06/13/2013  . Hip pain  06/10/2013  . Pain in joint, shoulder region 06/10/2013  . Right leg swelling 06/03/2013  .  Essential hypertension 06/03/2013  . Bradycardia by electrocardiogram 06/03/2013  . Obstructive sleep apnea 03/12/2013  . Spinal stenosis, lumbar region, with neurogenic claudication 12/14/2012    [place], PT 11/12/2015, 2:02 PM  [facility] [address] [place], [place], [address] Phone: [phone]   Fax:  [phone]  Name: NISAIAH BECHTOL MRN: [id number] Date of Birth: Oct 10, 1950

## 2015-11-13 ENCOUNTER — Ambulatory Visit: Payer: Medicare PPO | Admitting: Rehabilitative and Restorative Service Providers"

## 2015-11-13 ENCOUNTER — Other Ambulatory Visit: Payer: Self-pay | Admitting: Neurosurgery

## 2015-11-13 ENCOUNTER — Encounter: Payer: Self-pay | Admitting: Rehabilitative and Restorative Service Providers"

## 2015-11-13 DIAGNOSIS — R2681 Unsteadiness on feet: Secondary | ICD-10-CM | POA: Diagnosis not present

## 2015-11-13 DIAGNOSIS — R2689 Other abnormalities of gait and mobility: Secondary | ICD-10-CM | POA: Diagnosis not present

## 2015-11-13 DIAGNOSIS — M5412 Radiculopathy, cervical region: Secondary | ICD-10-CM

## 2015-11-13 DIAGNOSIS — R262 Difficulty in walking, not elsewhere classified: Secondary | ICD-10-CM | POA: Diagnosis not present

## 2015-11-13 DIAGNOSIS — R29818 Other symptoms and signs involving the nervous system: Secondary | ICD-10-CM

## 2015-11-13 DIAGNOSIS — M6281 Muscle weakness (generalized): Secondary | ICD-10-CM | POA: Diagnosis not present

## 2015-11-13 NOTE — Therapy (Signed)
[facility] [address] [place], [place], [address] Phone: [phone]   Fax:  [phone]  Physical Therapy Treatment  Patient Details  Name: [name] MRN: [id number] Date of Birth: 20-Sep-1950 No Data Recorded  Encounter Date: 11/13/2015      PT End of Session - 11/13/15 0948    Visit Number 11   Number of Visits 17   Date for PT Re-Evaluation 12/06/15   Authorization Type [employer] PPO-Auth Required; G-code every 10th visit   Authorization Time Period 6/6 initial visits utilized; 3 additional visit from 10/27/15-12/11/15 *awaiting further auth   PT Start Time 0935   PT Stop Time 1015   PT Time Calculation (min) 40 min   Equipment Utilized During Treatment Gait belt   Activity Tolerance Patient tolerated treatment well   Behavior During Therapy [facility] for tasks assessed/performed      Past Medical History:  Diagnosis Date  . Arthritis   . Bradycardia   . Cancer [facility]) 2013   skin cancer  . Depression    ptsd  . Dysrhythmia    chronic slow heart rate  . GERD (gastroesophageal reflux disease)   . Headache(784.0)    tension headaches non recent  . History of chicken pox   . History of kidney stones   . Hypertension    treated with HCTZ  . Parkinson's disease ([place])    dx'ed 15 years ago  . PTSD (post-traumatic stress disorder)   . Shortness of breath dyspnea   . Sleep apnea    doesn't use C-pap  . Varicose veins     Past Surgical History:  Procedure Laterality Date  . CHOLECYSTECTOMY N/A 10/22/2014   Procedure: LAPAROSCOPIC CHOLECYSTECTOMY WITH INTRAOPERATIVE CHOLANGIOGRAM;  Surgeon: Dia Crawford III, MD;  Location: [facility] ORS;  Service: General;  Laterality: N/A;  . cyst removed      from lip as a child  . LUMBAR LAMINECTOMY/DECOMPRESSION MICRODISCECTOMY Bilateral 12/14/2012   Procedure: Bilateral lumbar three-four, four-five decompressive laminotomy/foraminotomy;  Surgeon: Charlie Pitter, MD;  Location: [facility]  NEURO ORS;  Service: Neurosurgery;  Laterality: Bilateral;  . PULSE GENERATOR IMPLANT Bilateral 12/13/2013   Procedure: Bilateral implantable pulse generator placement;  Surgeon: Erline Levine, MD;  Location: [facility] NEURO ORS;  Service: Neurosurgery;  Laterality: Bilateral;  Bilateral implantable pulse generator placement  . skin cancer removed     from ears,   12 lft arm  rt leg 15  . SUBTHALAMIC STIMULATOR INSERTION Bilateral 12/06/2013   Procedure: SUBTHALAMIC STIMULATOR INSERTION;  Surgeon: Erline Levine, MD;  Location: [facility] NEURO ORS;  Service: Neurosurgery;  Laterality: Bilateral;  Bilateral deep brain stimulator placement    There were no vitals filed for this visit.      Subjective Assessment - 11/13/15 0939    Subjective The patient reports his hip and shoulder are hurting worse at this time.  He notes he woke up with pain worse today.   Patient is accompained by: Family member   Patient Stated Goals Pt's goal for therapy to be able to walk a straight line without falling.   Currently in Pain? Yes   Pain Score 6   hip is 4/10, shoulder 6/10   Pain Location Hip  and shoulder   Pain Orientation Right   Pain Descriptors / Indicators Aching   Pain Type Acute pain   Pain Onset More than a month ago   Pain Frequency Constant   Aggravating Factors  any movement   Pain Relieving Factors  medication       THERAPEUTIC EXERCISE: The patient performed supine piriformis stretch with passive overpressure, and passive ROM hamstring stretching Bridges x 10 reps  NEUROMUSCULAR RE-EDUCATION: Wide leg marching Lateral weight shifting without UE movements due to R shoulder pain Rock and reach PWR exercises without UEs in lateral direction and ant/posterior directions Lateral steps with core stabilization Rhythmic stabilization in standing with feet together and then on rocker board with external perturbations--patient able to maintain midline well today Marching 3 steps and then hold emphasizing  contraction of core stabilizers with  Min A and cues midline Anterior lunges with return to midline 180 degree turns and then 360 degree turns  SELF CARE/HOME MANAGEMENT: Discussed post d/c wellness plan and recommended Parkinson's exercise group that meets weekly Also discussed other possibilities that patient was interested in including pool classes and rocksteady (however R UE may not tolerate) Discussed compliance with HEP and need for regular movement to maintain current level of function      PT Short Term Goals - 11/12/15 1223      PT SHORT TERM GOAL #1   Title Pt will perform HEP independently for improved balance, transfers, and gait.  TARGET 11/05/15   Baseline Patient has been instructed in HEP and reports performing regularly.  PT to recheck technique/performance to ensure progressing well.   Time 4   Period Weeks   Status Achieved     PT SHORT TERM GOAL #2   Title Pt will improve TUG score to less than or equal to 13.5 seconds for decreased fall risk.   Baseline Improved from 15.23 seconds at eval to 12.19 seconds today.   Time 4   Period Weeks   Status Achieved     PT SHORT TERM GOAL #3   Title Pt will improve 5x sit<>stand to less than or equal to 14 seconds for decreased fall risk.   Baseline Improved from 17.58 seconds to 13.06 seconds.     Time 4   Period Weeks   Status Achieved     PT SHORT TERM GOAL #4   Title Pt will improve Functional Gait ASsessment to at least 14/30 for decreased fall risk.   Baseline patient improved from 9/30 up to 19/30 demonstrating dec'd fall risk.   Time 4   Period Weeks   Status Achieved     PT SHORT TERM GOAL #5   Title Pt will verbalize understanding of local Parkinson's resources.   Baseline Met on 11/06/2015 per patient's wife report-currently going to [facility] intermittently.   Time 4   Period Weeks   Status Achieved           PT Long Term Goals - 11/13/15 1134      PT LONG TERM GOAL #1   Title Pt will  verbalize understanding of fall prevention/tips to reduce freezing with gait in home environment.  TARGET 12/06/15   Baseline Fall prevention initiated with education to pt and wife.  11/04/15   Time 8   Period Weeks   Status Partially Met     PT LONG TERM GOAL #2   Title Pt will improve TUG manual to less than or equal to 15 seconds for decreased fall risk.   Time 8   Period Weeks   Status On-going     PT LONG TERM GOAL #3   Title Pt will perform at least 8 of 10 reps of sit<>stand transfers from less than 18 inch height, independently and no posterior lean or  LOB, for improved transfer efficiency and safety.   Time 8   Period Weeks   Status On-going     PT LONG TERM GOAL #4   Title Pt will improve Functional GAit Assessment to at least 19/30 for decreased fall risk.   Time 8   Period Weeks   Status On-going     PT LONG TERM GOAL #5   Title Pt will verbalize understanding of ongoing community fitness options upon D/C from PT.   Baseline PT provided [place] exercise classes, power over parkinson's support ground and rock steady brochure (did not think this would be best due to shoulder pain)   Time 8   Period Weeks   Status Achieved               Plan - 11/13/15 1135    Clinical Impression Statement The patient met LTG for community exercise options for post d/c.  At today's visit, he initially had worsened pain and responded well to stretching and movement reporting no pain at end of session.  PT to continue working to [employer].  We have discussed dec'd certainty of secondary insurance covering PT sessions and recommended they call # on card.  Patient's wife verbalizes understanding of [employer] not currently received and that we are unsure if [employer] will cover current sessions.    PT Treatment/Interventions ADLs/Self Care Home Management;Therapeutic exercise;Therapeutic activities;Functional mobility training;Gait training;Stair training;DME Instruction;Balance  training;Neuromuscular re-education;Patient/family education   PT Next Visit Plan awaiting further [employer] auth.; balance, trunk control.  DISCHARGE PLANNING   Consulted and Agree with Plan of Care Patient;Family member/caregiver   Family Member Consulted spouse      Patient will benefit from skilled therapeutic intervention in order to improve the following deficits and impairments:  Abnormal gait, Decreased activity tolerance, Decreased balance, Decreased mobility, Decreased strength, Difficulty walking, Postural dysfunction  Visit Diagnosis: Other abnormalities of gait and mobility  Unsteadiness on feet  Other symptoms and signs involving the nervous system  Muscle weakness (generalized)     Problem List Patient Active Problem List   Diagnosis Date Noted  . Left hand pain 10/30/2015  . Fracture, finger, multiple sites 10/30/2015  . Colon cancer screening 07/23/2015  . Screening examination for infectious disease 07/23/2015  . Gout 02/18/2015  . Depression 01/06/2015  . Irritation of eyelid 08/20/2014  . Dupuytren's contracture 08/20/2014  . Cough 07/21/2014  . Lumbar stenosis with neurogenic claudication 06/13/2014  . Preop cardiovascular exam 05/28/2014  . S/P deep brain stimulator placement 05/08/2014  . PD (Parkinson's disease) ([place]) 05/08/2014  . Joint pain 01/22/2014  . Medicare annual wellness visit, initial 01/19/2014  . Advance care planning 01/19/2014  . Parkinson's disease ([place]) 12/13/2013  . PTSD (post-traumatic stress disorder) 06/13/2013  . Erectile dysfunction 06/13/2013  . HLD (hyperlipidemia) 06/13/2013  . Hip pain 06/10/2013  . Pain in joint, shoulder region 06/10/2013  . Right leg swelling 06/03/2013  . Essential hypertension 06/03/2013  . Bradycardia by electrocardiogram 06/03/2013  . Obstructive sleep apnea 03/12/2013  . Spinal stenosis, lumbar region, with neurogenic claudication 12/14/2012    [place], PT 11/13/2015, 11:37  AM  [facility] [address] [place], [place], [address] Phone: [phone]   Fax:  [phone]  Name: JOMAR DENZ MRN: [id number] Date of Birth: 22-Jun-1950

## 2015-11-13 NOTE — Therapy (Signed)
[facility] [address] [place], [place], [address] Phone: [phone]   Fax:  [phone]  Patient Details  Name: [name] MRN: [id number] Date of Birth: 1950/07/27 Referring Provider:  No ref. provider found  Encounter Date: for encounter 11/11/2015  Physical Therapy Progress Note  Dates of Reporting Period: 10/06/2015 to 11/11/2015  Objective Reports of Subjective Statement: Patient has variable level of performance and function.  He appears to have good/bad days and after one fall, his pattern is to have 3-4 more in a week's timeframe.  He fell 1.5 weeks ago and fx hand and then had multiple other falls.  PT provided education [id number] nature of falls and discussed need to take medications at a regular time each day and to avoid heavy pain meds recommending he discuss how to manage pain with MD.  He had dec'd attention after arriving to PT that week and meds may have been a contributing factor. Rec f/u with pharmacist/MD for other medication advice/mgmt.  Objective Measurements:  See goals below for objective data.  Goal Update:      PT Short Term Goals - 11/12/15 1223      PT SHORT TERM GOAL #1   Title Pt will perform HEP independently for improved balance, transfers, and gait.  TARGET 11/05/15   Baseline Patient has been instructed in HEP and reports performing regularly.  PT to recheck technique/performance to ensure progressing well.   Time 4   Period Weeks   Status Achieved     PT SHORT TERM GOAL #2   Title Pt will improve TUG score to less than or equal to 13.5 seconds for decreased fall risk.   Baseline Improved from 15.23 seconds at eval to 12.19 seconds today.   Time 4   Period Weeks   Status Achieved     PT SHORT TERM GOAL #3   Title Pt will improve 5x sit<>stand to less than or equal to 14 seconds for decreased fall risk.   Baseline Improved from 17.58 seconds to 13.06 seconds.     Time  4   Period Weeks   Status Achieved     PT SHORT TERM GOAL #4   Title Pt will improve Functional Gait ASsessment to at least 14/30 for decreased fall risk.   Baseline patient improved from 9/30 up to 19/30 demonstrating dec'd fall risk.   Time 4   Period Weeks   Status Achieved     PT SHORT TERM GOAL #5   Title Pt will verbalize understanding of local Parkinson's resources.   Baseline Met on 11/06/2015 per patient's wife report-currently going to [facility] intermittently.   Time 4   Period Weeks   Status Achieved         PT Long Term Goals - 11/13/15 1134      PT LONG TERM GOAL #1   Title Pt will verbalize understanding of fall prevention/tips to reduce freezing with gait in home environment.  TARGET 12/06/15   Baseline Fall prevention initiated with education to pt and wife.  11/04/15   Time 8   Period Weeks   Status Partially Met     PT LONG TERM GOAL #2   Title Pt will improve TUG manual to less than or equal to 15 seconds for decreased fall risk.   Time 8   Period Weeks   Status On-going     PT LONG TERM GOAL #3   Title Pt will perform at least 8 of  10 reps of sit<>stand transfers from less than 18 inch height, independently and no posterior lean or LOB, for improved transfer efficiency and safety.   Time 8   Period Weeks   Status On-going     PT LONG TERM GOAL #4   Title Pt will improve Functional GAit Assessment to at least 19/30 for decreased fall risk.   Time 8   Period Weeks   Status On-going     PT LONG TERM GOAL #5   Title Pt will verbalize understanding of ongoing community fitness options upon D/C from PT.   Baseline PT provided [place] exercise classes, power over parkinson's support ground and rock steady brochure (did not think this would be best due to shoulder pain)   Time 8   Period Weeks   Status Achieved       Plan:      Plan - 11/13/15 1135    Clinical Impression Statement The patient met LTG for community exercise options for post  d/c.  At today's visit, he initially had worsened pain and responded well to stretching and movement reporting no pain at end of session.  PT to continue working to [employer].  We have discussed dec'd certainty of secondary insurance covering PT sessions and recommended they call # on card.  Patient's wife verbalizes understanding of [employer] not currently received and that we are unsure if [employer] will cover current sessions.    PT Treatment/Interventions ADLs/Self Care Home Management;Therapeutic exercise;Therapeutic activities;Functional mobility training;Gait training;Stair training;DME Instruction;Balance training;Neuromuscular re-education;Patient/family education   PT Next Visit Plan awaiting further [employer] auth.; balance, trunk control.  DISCHARGE PLANNING   Consulted and Agree with Plan of Care Patient;Family member/caregiver   Family Member Consulted spouse       Reason Skilled Services are Required: d/c exercise plan, multi-factoral fall prevention, trunk control for improved balance  Thank you for the referral of this patient. Rudell Cobb, MPT     Sameul Tagle 11/13/2015, 11:43 AM  [facility] [address] [place], [place], [address] Phone: [phone]   Fax:  [phone]

## 2015-11-18 ENCOUNTER — Ambulatory Visit: Payer: Medicare PPO | Attending: Neurology | Admitting: Rehabilitative and Restorative Service Providers"

## 2015-11-18 DIAGNOSIS — R2681 Unsteadiness on feet: Secondary | ICD-10-CM

## 2015-11-18 DIAGNOSIS — R29818 Other symptoms and signs involving the nervous system: Secondary | ICD-10-CM

## 2015-11-18 DIAGNOSIS — R2689 Other abnormalities of gait and mobility: Secondary | ICD-10-CM

## 2015-11-18 DIAGNOSIS — M6281 Muscle weakness (generalized): Secondary | ICD-10-CM | POA: Diagnosis not present

## 2015-11-18 NOTE — Therapy (Signed)
[facility] [address] [place], [place], [address] Phone: [phone]   Fax:  [phone]  Physical Therapy Treatment  Patient Details  Name: [name] MRN: [id number] Date of Birth: 1950-06-28 No Data Recorded  Encounter Date: 11/18/2015      PT End of Session - 11/18/15 1421    Visit Number 12   Number of Visits 17   Date for PT Re-Evaluation 12/06/15   Authorization Type [employer] PPO-Auth Required; G-code every 10th visit   Authorization Time Period 6/6 initially used, 3/3 used, now received 4 more visits.   Authorization - Visit Number 3   Authorization - Number of Visits 4   PT Start Time [phone]   PT Stop Time 0930   PT Time Calculation (min) 40 min   Equipment Utilized During Treatment Gait belt   Activity Tolerance Patient tolerated treatment well   Behavior During Therapy WFL for tasks assessed/performed      Past Medical History:  Diagnosis Date  . Arthritis   . Bradycardia   . Cancer [facility]) 2013   skin cancer  . Depression    ptsd  . Dysrhythmia    chronic slow heart rate  . GERD (gastroesophageal reflux disease)   . Headache(784.0)    tension headaches non recent  . History of chicken pox   . History of kidney stones   . Hypertension    treated with HCTZ  . Parkinson's disease ([place])    dx'ed 15 years ago  . PTSD (post-traumatic stress disorder)   . Shortness of breath dyspnea   . Sleep apnea    doesn't use C-pap  . Varicose veins     Past Surgical History:  Procedure Laterality Date  . CHOLECYSTECTOMY N/A 10/22/2014   Procedure: LAPAROSCOPIC CHOLECYSTECTOMY WITH INTRAOPERATIVE CHOLANGIOGRAM;  Surgeon: Dia Crawford III, MD;  Location: [facility] ORS;  Service: General;  Laterality: N/A;  . cyst removed      from lip as a child  . LUMBAR LAMINECTOMY/DECOMPRESSION MICRODISCECTOMY Bilateral 12/14/2012   Procedure: Bilateral lumbar three-four, four-five decompressive laminotomy/foraminotomy;   Surgeon: Charlie Pitter, MD;  Location: [place] NEURO ORS;  Service: Neurosurgery;  Laterality: Bilateral;  . PULSE GENERATOR IMPLANT Bilateral 12/13/2013   Procedure: Bilateral implantable pulse generator placement;  Surgeon: Erline Levine, MD;  Location: [place] NEURO ORS;  Service: Neurosurgery;  Laterality: Bilateral;  Bilateral implantable pulse generator placement  . skin cancer removed     from ears,   12 lft arm  rt leg 15  . SUBTHALAMIC STIMULATOR INSERTION Bilateral 12/06/2013   Procedure: SUBTHALAMIC STIMULATOR INSERTION;  Surgeon: Erline Levine, MD;  Location: [place] NEURO ORS;  Service: Neurosurgery;  Laterality: Bilateral;  Bilateral deep brain stimulator placement    There were no vitals filed for this visit.      Subjective Assessment - 11/18/15 0856    Subjective The patient is taking the medicine more regularly.  He had a fall last Friday and has multiple near falls since that time.  The patient reports he gets frustrated with mobility.   Patient is accompained by: Family member  spouse   Patient Stated Goals Pt's goal for therapy to be able to walk a straight line without falling.   Currently in Pain? Yes   Pain Score --  "not a lot"   Pain Location Shoulder   Pain Orientation Right   Pain Descriptors / Indicators Aching   Pain Type Chronic pain   Pain Onset More than a month  ago   Pain Frequency Constant   Aggravating Factors  any movement   Pain Relieving Factors medication            [facility] PT Assessment - 11/18/15 0904      Timed Up and Go Test   Manual TUG (seconds) 16.24  carrying a cup of water in left hand     Functional Gait  Assessment   Gait assessed  Yes   Gait Level Surface Walks 20 ft, slow speed, abnormal gait pattern, evidence for imbalance or deviates 10-15 in outside of the 12 in walkway width. Requires more than 7 sec to ambulate 20 ft.   Change in Gait Speed Able to change speed, demonstrates mild gait deviations, deviates 6-10 in outside of the 12 in  walkway width, or no gait deviations, unable to achieve a major change in velocity, or uses a change in velocity, or uses an assistive device.   Gait with Horizontal Head Turns Performs head turns smoothly with slight change in gait velocity (eg, minor disruption to smooth gait path), deviates 6-10 in outside 12 in walkway width, or uses an assistive device.   Gait with Vertical Head Turns Performs task with slight change in gait velocity (eg, minor disruption to smooth gait path), deviates 6 - 10 in outside 12 in walkway width or uses assistive device   Gait and Pivot Turn Pivot turns safely within 3 sec and stops quickly with no loss of balance.   Step Over Obstacle Is able to step over one shoe box (4.5 in total height) but must slow down and adjust steps to clear box safely. May require verbal cueing.   Gait with Narrow Base of Support Ambulates 4-7 steps.   Gait with Eyes Closed Walks 20 ft, slow speed, abnormal gait pattern, evidence for imbalance, deviates 10-15 in outside 12 in walkway width. Requires more than 9 sec to ambulate 20 ft.   Ambulating Backwards Walks 20 ft, uses assistive device, slower speed, mild gait deviations, deviates 6-10 in outside 12 in walkway width.   Steps Alternating feet, must use rail.   Total Score 17   FGA comment: 17/30 today      Gait: Performed gait with direction changes emphasizing upright trunk/postural control during activities  SELF CARE/HOME MANAGEMENT: Discussed home exercise plan and recommended 3 times of performance between now and next session (next week).  Also reviewed and emphasized need for participation in community program to improve compliance with long term exercise.      PT Short Term Goals - 11/12/15 1223      PT SHORT TERM GOAL #1   Title Pt will perform HEP independently for improved balance, transfers, and gait.  TARGET 11/05/15   Baseline Patient has been instructed in HEP and reports performing regularly.  PT to recheck  technique/performance to ensure progressing well.   Time 4   Period Weeks   Status Achieved     PT SHORT TERM GOAL #2   Title Pt will improve TUG score to less than or equal to 13.5 seconds for decreased fall risk.   Baseline Improved from 15.23 seconds at eval to 12.19 seconds today.   Time 4   Period Weeks   Status Achieved     PT SHORT TERM GOAL #3   Title Pt will improve 5x sit<>stand to less than or equal to 14 seconds for decreased fall risk.   Baseline Improved from 17.58 seconds to 13.06 seconds.     Time 4  Period Weeks   Status Achieved     PT SHORT TERM GOAL #4   Title Pt will improve Functional Gait ASsessment to at least 14/30 for decreased fall risk.   Baseline patient improved from 9/30 up to 19/30 demonstrating dec'd fall risk.   Time 4   Period Weeks   Status Achieved     PT SHORT TERM GOAL #5   Title Pt will verbalize understanding of local Parkinson's resources.   Baseline Met on 11/06/2015 per patient's wife report-currently going to [facility] intermittently.   Time 4   Period Weeks   Status Achieved           PT Long Term Goals - 11/18/15 4097      PT LONG TERM GOAL #1   Title Pt will verbalize understanding of fall prevention/tips to reduce freezing with gait in home environment.  TARGET 12/06/15   Baseline Fall prevention initiated with education to pt and wife.  11/04/15   Time 8   Period Weeks   Status Partially Met     PT LONG TERM GOAL #2   Title Pt will improve TUG manual to less than or equal to 15 seconds for decreased fall risk.   Baseline Improved to 16.24 seconds.    Time 8   Period Weeks   Status Not Met     PT LONG TERM GOAL #3   Title Pt will perform at least 8 of 10 reps of sit<>stand transfers from less than 18 inch height, independently and no posterior lean or LOB, for improved transfer efficiency and safety.   Baseline Met on 11/18/2015   Time 8   Period Weeks   Status Achieved     PT LONG TERM GOAL #4   Title Pt  will improve Functional GAit Assessment to at least 19/30 for decreased fall risk.   Baseline Improved from 9/30 up to 17/30 at today's visit.  The patient has variable mobility from session to session.   Time 8   Period Weeks   Status Partially Met     PT LONG TERM GOAL #5   Title Pt will verbalize understanding of ongoing community fitness options upon D/C from PT.   Baseline PT provided [place] exercise classes, power over parkinson's support ground and rock steady brochure (did not think this would be best due to shoulder pain)   Time 8   Period Weeks   Status Achieved               Plan - 11/18/15 1424    Clinical Impression Statement The patient has met 2 LTGs and partially met another LTG.  He continues to have intermittent performance and does not feel exercise reduces the down cycles of performance.  PT emphasized need to continue to perform exercise to maintain current status and slow progression of disease.   Discussed long term plan for exercise with intent to d/c in next 1-2 sessions.   PT Treatment/Interventions ADLs/Self Care Home Management;Therapeutic exercise;Therapeutic activities;Functional mobility training;Gait training;Stair training;DME Instruction;Balance training;Neuromuscular re-education;Patient/family education   PT Next Visit Plan Review HEP, check goals, d/c plan for community wellness.  Plan to d/c in next 1-2 sessions.   Consulted and Agree with Plan of Care Patient      Patient will benefit from skilled therapeutic intervention in order to improve the following deficits and impairments:  Abnormal gait, Decreased activity tolerance, Decreased balance, Decreased mobility, Decreased strength, Difficulty walking, Postural dysfunction  Visit Diagnosis: Other abnormalities of  gait and mobility  Unsteadiness on feet  Other symptoms and signs involving the nervous system  Muscle weakness (generalized)     Problem List Patient Active Problem  List   Diagnosis Date Noted  . Left hand pain 10/30/2015  . Fracture, finger, multiple sites 10/30/2015  . Colon cancer screening 07/23/2015  . Screening examination for infectious disease 07/23/2015  . Gout 02/18/2015  . Depression 01/06/2015  . Irritation of eyelid 08/20/2014  . Dupuytren's contracture 08/20/2014  . Cough 07/21/2014  . Lumbar stenosis with neurogenic claudication 06/13/2014  . Preop cardiovascular exam 05/28/2014  . S/P deep brain stimulator placement 05/08/2014  . PD (Parkinson's disease) ([place]) 05/08/2014  . Joint pain 01/22/2014  . Medicare annual wellness visit, initial 01/19/2014  . Advance care planning 01/19/2014  . Parkinson's disease ([place]) 12/13/2013  . PTSD (post-traumatic stress disorder) 06/13/2013  . Erectile dysfunction 06/13/2013  . HLD (hyperlipidemia) 06/13/2013  . Hip pain 06/10/2013  . Pain in joint, shoulder region 06/10/2013  . Right leg swelling 06/03/2013  . Essential hypertension 06/03/2013  . Bradycardia by electrocardiogram 06/03/2013  . Obstructive sleep apnea 03/12/2013  . Spinal stenosis, lumbar region, with neurogenic claudication 12/14/2012    Ellisha Bankson, PT 11/18/2015, 2:27 PM  [facility] [address] [place], [place], [address] Phone: [phone]   Fax:  [phone]  Name: BALIAN SCHALLER MRN: [id number] Date of Birth: 1950/12/26

## 2015-11-19 ENCOUNTER — Ambulatory Visit: Payer: Medicare PPO | Admitting: Rehabilitative and Restorative Service Providers"

## 2015-11-23 ENCOUNTER — Ambulatory Visit: Payer: Self-pay | Admitting: Neurology

## 2015-11-23 ENCOUNTER — Ambulatory Visit: Payer: Self-pay | Admitting: Rehabilitative and Restorative Service Providers"

## 2015-11-25 ENCOUNTER — Ambulatory Visit: Payer: Medicare PPO | Admitting: Rehabilitative and Restorative Service Providers"

## 2015-11-26 ENCOUNTER — Other Ambulatory Visit: Payer: Self-pay | Admitting: Cardiovascular Disease

## 2015-11-26 ENCOUNTER — Telehealth: Payer: Self-pay | Admitting: Neurology

## 2015-11-26 NOTE — Telephone Encounter (Signed)
Patient wife candice wants to speak to someone about MRI please call [phone]

## 2015-11-26 NOTE — Telephone Encounter (Signed)
Spoke with MR tech (Char) and she states the problem is that they can only use the split head coils for the MR on brain scans. To perform a scan on his lumbar/cervical spine they would have to use a body coil and the entire body would be stimulated and that isn't allowed post DBS surgery. Please advise.

## 2015-11-26 NOTE — Telephone Encounter (Signed)
[name] will fill out form as he has to go to the MRI unit to turn off device anyway

## 2015-11-26 NOTE — Telephone Encounter (Signed)
Needs help coordinating an okay for an MR at [facility] with patient's stimulator. [employer] told her that Dr. Carles Collet would have to fill out a form available on their website. I can not figure out what she is talking about. Dr. Carles Collet can you see if Rep knows what they mean?

## 2015-11-26 NOTE — Telephone Encounter (Signed)
Dusty will help them with this.  FDA indications changed in Jan and he can have a lumbar/cervical MRI now under special protocol.  She can call Dusty if needed but he will come for scan.  Can give her dusty's contact info and he can check if patients device is compatible to be scanned under new indications

## 2015-11-27 NOTE — Telephone Encounter (Signed)
Left message on machine for Dusty to call back.

## 2015-11-27 NOTE — Telephone Encounter (Signed)
char

## 2015-11-27 NOTE — Telephone Encounter (Signed)
Give Dusty's info to patient or Char?

## 2015-11-27 NOTE — Telephone Encounter (Signed)
Spoke with Belva Bertin - he states he spoke with Char a while back and the problem is that the MR software isn't up to date to be able to do a 1+RMS scan. He is going to touch base with her again and let me know the outcome. He does say that patient can have done at [facility] if needed.

## 2015-11-30 ENCOUNTER — Ambulatory Visit (INDEPENDENT_AMBULATORY_CARE_PROVIDER_SITE_OTHER)
Admission: RE | Admit: 2015-11-30 | Discharge: 2015-11-30 | Disposition: A | Discharge status: Home / Self Care | Payer: Medicare PPO | Source: Ambulatory Visit | Attending: Family Medicine | Admitting: Family Medicine

## 2015-11-30 ENCOUNTER — Encounter: Payer: Self-pay | Admitting: Family Medicine

## 2015-11-30 ENCOUNTER — Ambulatory Visit (INDEPENDENT_AMBULATORY_CARE_PROVIDER_SITE_OTHER): Payer: Medicare PPO | Admitting: Family Medicine

## 2015-11-30 VITALS — BP 108/64 | HR 51 | Temp 97.7°F | Wt 184.0 lb

## 2015-11-30 DIAGNOSIS — S62623A Displaced fracture of medial phalanx of left middle finger, initial encounter for closed fracture: Secondary | ICD-10-CM | POA: Diagnosis not present

## 2015-11-30 DIAGNOSIS — S62609D Fracture of unspecified phalanx of unspecified finger, subsequent encounter for fracture with routine healing: Secondary | ICD-10-CM

## 2015-11-30 NOTE — Progress Notes (Signed)
Dr. Frederico Hamman T. Tani Virgo, MD, [facility] [place], [address] Phone: [phone] Fax: [phone]  11/30/2015  Patient: [name], MRN: [id number], DOB: November 20, 1950, 65 y.o.  Primary Physician:  Elsie Stain, MD   Chief Complaint  Patient presents with  . f/u finger fracture   Subjective:   [name] is a 65 y.o. very pleasant male patient who presents with the following:  F/u L finger fracture:  DOI: 10/29/2015  He has done very well. He has been about 80% compliant wearing his splint. He does have a little bit more when he is active and packing boxes, and they're moving right now. No significant swelling or bruising.  Past Medical History, Surgical History, Social History, Family History, Problem List, Medications, and Allergies have been reviewed and updated if relevant.  Patient Active Problem List   Diagnosis Date Noted  . Left hand pain 10/30/2015  . Fracture, finger, multiple sites 10/30/2015  . Colon cancer screening 07/23/2015  . Screening examination for infectious disease 07/23/2015  . Gout 02/18/2015  . Depression 01/06/2015  . Irritation of eyelid 08/20/2014  . Dupuytren's contracture 08/20/2014  . Cough 07/21/2014  . Lumbar stenosis with neurogenic claudication 06/13/2014  . Preop cardiovascular exam 05/28/2014  . S/P deep brain stimulator placement 05/08/2014  . PD (Parkinson's disease) ([place]) 05/08/2014  . Joint pain 01/22/2014  . Medicare annual wellness visit, initial 01/19/2014  . Advance care planning 01/19/2014  . Parkinson's disease ([place]) 12/13/2013  . PTSD (post-traumatic stress disorder) 06/13/2013  . Erectile dysfunction 06/13/2013  . HLD (hyperlipidemia) 06/13/2013  . Hip pain 06/10/2013  . Pain in joint, shoulder region 06/10/2013  . Right leg swelling 06/03/2013  . Essential hypertension 06/03/2013  . Bradycardia by electrocardiogram 06/03/2013  . Obstructive sleep  apnea 03/12/2013  . Spinal stenosis, lumbar region, with neurogenic claudication 12/14/2012    Past Medical History:  Diagnosis Date  . Arthritis   . Bradycardia   . Cancer [facility]) 2013   skin cancer  . Depression    ptsd  . Dysrhythmia    chronic slow heart rate  . GERD (gastroesophageal reflux disease)   . Headache(784.0)    tension headaches non recent  . History of chicken pox   . History of kidney stones   . Hypertension    treated with HCTZ  . Parkinson's disease ([place])    dx'ed 15 years ago  . PTSD (post-traumatic stress disorder)   . Shortness of breath dyspnea   . Sleep apnea    doesn't use C-pap  . Varicose veins     Past Surgical History:  Procedure Laterality Date  . CHOLECYSTECTOMY N/A 10/22/2014   Procedure: LAPAROSCOPIC CHOLECYSTECTOMY WITH INTRAOPERATIVE CHOLANGIOGRAM;  Surgeon: Dia Crawford III, MD;  Location: [facility] ORS;  Service: General;  Laterality: N/A;  . cyst removed      from lip as a child  . LUMBAR LAMINECTOMY/DECOMPRESSION MICRODISCECTOMY Bilateral 12/14/2012   Procedure: Bilateral lumbar three-four, four-five decompressive laminotomy/foraminotomy;  Surgeon: Charlie Pitter, MD;  Location: [place] NEURO ORS;  Service: Neurosurgery;  Laterality: Bilateral;  . PULSE GENERATOR IMPLANT Bilateral 12/13/2013   Procedure: Bilateral implantable pulse generator placement;  Surgeon: Erline Levine, MD;  Location: [place] NEURO ORS;  Service: Neurosurgery;  Laterality: Bilateral;  Bilateral implantable pulse generator placement  . skin cancer removed     from ears,   12 lft arm  rt leg 15  . SUBTHALAMIC STIMULATOR INSERTION Bilateral  12/06/2013   Procedure: SUBTHALAMIC STIMULATOR INSERTION;  Surgeon: Erline Levine, MD;  Location: [facility] NEURO ORS;  Service: Neurosurgery;  Laterality: Bilateral;  Bilateral deep brain stimulator placement    Social History   Social History  . Marital status: Married    Spouse name: N/A  . Number of children: N/A  . Years of education: N/A    Occupational History  . disabled     2001, [employer]  .      search and rescue helicopter   Social History Main Topics  . Smoking status: Never Smoker  . Smokeless tobacco: Former Systems developer  . Alcohol use 0.0 oz/week     Comment: occasional (twice per month)  . Drug use: No  . Sexual activity: No   Other Topics Concern  . 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    Married 2014    Family History  Problem Relation Age of Onset  . Colon cancer Neg Hx   . Prostate cancer Neg Hx   . Lung cancer Father     Allergies  Allergen Reactions  . Klonopin [Clonazepam] Other (See Comments)    Worsening mood/depression  . Morphine And Related     hallucinations    Medication list reviewed and updated in full in [facility].  GEN: No fevers, chills. Nontoxic. Primarily MSK c/o today. MSK: Detailed in the HPI GI: tolerating PO intake without difficulty Neuro: No numbness, parasthesias, or tingling associated. Otherwise the pertinent positives of the ROS are noted above.   Objective:   BP 108/64 (BP Location: Left Arm, Patient Position: Sitting, Cuff Size: Normal)   Pulse (!) 51   Temp 97.7 F (36.5 C) (Oral)   Wt 184 lb (83.5 kg)   SpO2 97%   BMI 27.17 kg/m    GEN: WDWN, NAD, Non-toxic, Alert & Oriented x 3 HEENT: Atraumatic, Normocephalic.  Ears and Nose: No external deformity. EXTR: No clubbing/cyanosis/edema NEURO: Normal gait.  PSYCH: Normally interactive. Conversant. Not depressed or anxious appearing.  Calm demeanor.    Mild tenderness at the volar DIP joint on the third digit and slightly more tenderness at the PIP joint. At the fourth digit there is some very slight tenderness at the PIP joint.  He is able to make almost an entire complete composite fist.  Radiology: Dg Finger Middle Left  Result Date: 11/30/2015 CLINICAL DATA:  Fracture, finger, multiple sites,  with routine healing, subsequent encounter; in PACS EXAM: LEFT MIDDLE FINGER 2+V COMPARISON:  11/11/2015 FINDINGS: Subtle fractures are evident along the anterior bases of the middle phalanges of the middle and ring fingers. Some apparent bony bridging across the middle finger fractures evident representing further healing since the prior exam. No other abnormalities. IMPRESSION: Further healing of the fracture at the base of the middle phalanx of the left middle finger. There is also evidence of nondisplaced fracture along the anterior base of the middle phalanx of the ring finger Electronically Signed   By: Lajean Manes M.D.   On: 11/30/2015 12:26    Assessment and Plan:   Fracture, finger, multiple sites, with routine healing, subsequent encounter - Plan: DG Finger Middle Left  Multiple very small fractures, healing. Radiographical evidence for healing at the DIP fracture on the third.  Very small fracture at the PIP joint on the fourth not seen previously, but stabilized with previous and  current splinting and taping.  Also showed the patient how to buddy tape his fingers 3 and 4 together, which would allow him to bend slightly more to avoid stiffness, but protect his fingers. Recommended continue doing this for 2 additional weeks.  Practice finger range of motion at least 3 times a day.  Follow-up: prn  Orders Placed This Encounter  Procedures  . DG Finger Middle Left    Signed,  Raiana Pharris T. Breiana Stratmann, MD   Patient's Medications  New Prescriptions   No medications on file  Previous Medications   ARTIFICIAL TEAR OINTMENT (DRY EYES OP)    Apply to eye.   BUPROPION (WELLBUTRIN XL) 150 MG 24 HR TABLET    TAKE 3 TABLETS DAILY   CARBIDOPA-LEVODOPA (SINEMET IR) 25-100 MG PER TABLET    Take by mouth. 2 AM/ 1 afternoon/ 1 evening   CHOLECALCIFEROL (VITAMIN D) 1000 UNITS TABLET    Take 1,000 Units by mouth daily.   ESCITALOPRAM (LEXAPRO) 10 MG TABLET    Take 1 tablet (10 mg total) by  mouth daily.   FLUTICASONE (FLONASE) 50 MCG/ACT NASAL SPRAY       HYDROCHLOROTHIAZIDE (HYDRODIURIL) 25 MG TABLET    TAKE 1 TABLET DAILY   NAPROXEN (NAPROSYN) 500 MG TABLET    Take 500 mg by mouth 2 (two) times daily with a meal.   OXYCODONE-ACETAMINOPHEN (PERCOCET/ROXICET) 5-325 MG TABLET    Take 1-2 tablets by mouth every 6 (six) hours as needed for severe pain (sedation caution).   PRAVASTATIN (PRAVACHOL) 20 MG TABLET    TAKE 1 TABLET EVERY EVENING  Modified Medications   No medications on file  Discontinued Medications   No medications on file

## 2015-11-30 NOTE — Progress Notes (Signed)
Pre visit review using our clinic review tool, if applicable. No additional management support is needed unless otherwise documented below in the visit note. 

## 2015-12-03 ENCOUNTER — Ambulatory Visit (INDEPENDENT_AMBULATORY_CARE_PROVIDER_SITE_OTHER): Payer: Medicare PPO | Admitting: Psychology

## 2015-12-03 ENCOUNTER — Encounter: Payer: Self-pay | Admitting: Family Medicine

## 2015-12-03 ENCOUNTER — Ambulatory Visit (INDEPENDENT_AMBULATORY_CARE_PROVIDER_SITE_OTHER): Payer: Medicare PPO | Admitting: Family Medicine

## 2015-12-03 ENCOUNTER — Ambulatory Visit (INDEPENDENT_AMBULATORY_CARE_PROVIDER_SITE_OTHER)
Admission: RE | Admit: 2015-12-03 | Discharge: 2015-12-03 | Disposition: A | Discharge status: Home / Self Care | Payer: Medicare PPO | Source: Ambulatory Visit | Attending: Family Medicine | Admitting: Family Medicine

## 2015-12-03 VITALS — BP 102/62 | HR 60 | Temp 98.7°F

## 2015-12-03 DIAGNOSIS — F321 Major depressive disorder, single episode, moderate: Secondary | ICD-10-CM | POA: Diagnosis not present

## 2015-12-03 DIAGNOSIS — M25512 Pain in left shoulder: Secondary | ICD-10-CM | POA: Diagnosis not present

## 2015-12-03 DIAGNOSIS — S4992XA Unspecified injury of left shoulder and upper arm, initial encounter: Secondary | ICD-10-CM | POA: Diagnosis not present

## 2015-12-03 NOTE — Progress Notes (Signed)
Fall on the day before yesterday.  ~7PM.  Was turned around and backed into some steps.  Fell down 5 steps.  Scraped his back, upper and lower.  No LOC.  He down about 5 minutes then got himself up.  Sore from the fall, "everything hurts."   B knees and B shoulders.  He is still trying to get MRI schedule per Dr. Marchelle Folks clinic for back pain at baseline.  Still walking with cane (he went out to get the cane, had left it outside, when the fall happened).  Meds, vitals, and allergies reviewed.   ROS: Per HPI unless specifically indicated in ROS section   nad ncat Neck supple, no LA rrr ctab L AC ttp, some pain with int rotation, no pain with ext rotation. Some pain with AC testing on L side, not on R Healing abrasions on the B back noted.  Ext w/o edema.  Walking with cane.  Speech at baseline.

## 2015-12-03 NOTE — Progress Notes (Signed)
Pre visit review using our clinic review tool, if applicable. No additional management support is needed unless otherwise documented below in the visit note. 

## 2015-12-03 NOTE — Patient Instructions (Signed)
We'll contact you with your xray report. I'll check with Pool and Tat.  Take care.  Glad to see you.

## 2015-12-04 NOTE — Assessment & Plan Note (Signed)
No arm drop, but with AC pain. No separation need on imaging.  Wouldn't use sling due to fall risk, patient agrees. Continue prn naproxen and prn oxycodone if needed.  Okay for outpatient f/u.  D/w pt about fall cautions.  In the midst of move to new housing that will be easier to get around.

## 2015-12-07 ENCOUNTER — Telehealth: Payer: Self-pay | Admitting: Family Medicine

## 2015-12-07 ENCOUNTER — Ambulatory Visit: Payer: Medicare PPO | Admitting: Rehabilitative and Restorative Service Providers"

## 2015-12-07 ENCOUNTER — Encounter: Payer: Self-pay | Admitting: Rehabilitative and Restorative Service Providers"

## 2015-12-07 DIAGNOSIS — R2689 Other abnormalities of gait and mobility: Secondary | ICD-10-CM

## 2015-12-07 DIAGNOSIS — R2681 Unsteadiness on feet: Secondary | ICD-10-CM

## 2015-12-07 DIAGNOSIS — R29818 Other symptoms and signs involving the nervous system: Secondary | ICD-10-CM | POA: Diagnosis not present

## 2015-12-07 DIAGNOSIS — M6281 Muscle weakness (generalized): Secondary | ICD-10-CM

## 2015-12-07 NOTE — Therapy (Signed)
[facility] [address] [place], [place], [address] Phone: [phone]   Fax:  [phone]  Patient Details  Name: [name] MRN: [id number] Date of Birth: 06/25/1950 Referring Provider:  No ref. provider found  Encounter Date: 12/07/2015  PHYSICAL THERAPY DISCHARGE SUMMARY  Visits from Start of Care: 13  Current functional level related to goals / functional outcomes:     PT Short Term Goals - 11/12/15 1223      PT SHORT TERM GOAL #1   Title Pt will perform HEP independently for improved balance, transfers, and gait.  TARGET 11/05/15   Baseline Patient has been instructed in HEP and reports performing regularly.  PT to recheck technique/performance to ensure progressing well.   Time 4   Period Weeks   Status Achieved     PT SHORT TERM GOAL #2   Title Pt will improve TUG score to less than or equal to 13.5 seconds for decreased fall risk.   Baseline Improved from 15.23 seconds at eval to 12.19 seconds today.   Time 4   Period Weeks   Status Achieved     PT SHORT TERM GOAL #3   Title Pt will improve 5x sit<>stand to less than or equal to 14 seconds for decreased fall risk.   Baseline Improved from 17.58 seconds to 13.06 seconds.     Time 4   Period Weeks   Status Achieved     PT SHORT TERM GOAL #4   Title Pt will improve Functional Gait ASsessment to at least 14/30 for decreased fall risk.   Baseline patient improved from 9/30 up to 19/30 demonstrating dec'd fall risk.   Time 4   Period Weeks   Status Achieved     PT SHORT TERM GOAL #5   Title Pt will verbalize understanding of local Parkinson's resources.   Baseline Met on 11/06/2015 per patient's wife report-currently going to [facility] intermittently.   Time 4   Period Weeks   Status Achieved         PT Long Term Goals - 12/07/15 7371      PT LONG TERM GOAL #1   Title Pt will verbalize understanding of fall prevention/tips to reduce  freezing with gait in home environment.  TARGET 12/06/15   Baseline Fall prevention initiated with education to pt and wife.  11/04/15   Time 8   Period Weeks   Status Partially Met     PT LONG TERM GOAL #2   Title Pt will improve TUG manual to less than or equal to 15 seconds for decreased fall risk.   Baseline Improved to 15.24 seconds.    Time 8   Period Weeks   Status Partially Met     PT LONG TERM GOAL #3   Title Pt will perform at least 8 of 10 reps of sit<>stand transfers from less than 18 inch height, independently and no posterior lean or LOB, for improved transfer efficiency and safety.   Baseline Met on 11/18/2015   Time 8   Period Weeks   Status Achieved     PT LONG TERM GOAL #4   Title Pt will improve Functional GAit Assessment to at least 19/30 for decreased fall risk.   Baseline Improved from 9/30 up to 17/30 at today's visit.  The patient has variable mobility from session to session.   Time 8   Period Weeks   Status Partially Met     PT LONG TERM GOAL #  5   Title Pt will verbalize understanding of ongoing community fitness options upon D/C from PT.   Baseline PT provided [place] exercise classes, power over parkinson's support ground and rock steady brochure (did not think this would be best due to shoulder pain)   Time 8   Period Weeks   Status Achieved        Remaining deficits: Chronic deficits related to Parkinson's Disease including intermittent falls, dyskinesias, imbalance and variable gait pattern Chronic back pain R shoulder pain    Education / Equipment: HEP, community wellness opportunities, parkinson's support group, safety/fall prevention, assistive device use.      Plan - 12/07/15 2015    Clinical Impression Statement The patient continues to have variable performance day to day.  PT and patient + patient's spouse have discussed variables within patient's control of maintaing consistent medication schedule (recommend discuss further with  MD for questions), safety in home, fall prevention, and use of assistive device.  We have also discussed performance of community exercises post d/c from therapy.  PT recommends f/u with free parkinson's screen in 4-6 months.  Patient partially met LTGs for PT and has variable performance from session to session.    PT Next Visit Plan Discharge   Consulted and Agree with Plan of Care Patient       Plan: Patient agrees to discharge.  Patient goals were partially met. Patient is being discharged due to meeting the stated rehab goals.  ?????       Thank you for the referral of this patient. Rudell Cobb, MPT   Riaz Onorato 12/07/2015, 8:28 PM  [facility] [address] [place], [place], [address] Phone: [phone]   Fax:  [phone]

## 2015-12-07 NOTE — Therapy (Signed)
[facility] [address] [place], [place], [address] Phone: [phone]   Fax:  [phone]  Physical Therapy Treatment  Patient Details  Name: [name] MRN: [id number] Date of Birth: [birth year] No Data Recorded  Encounter Date: 12/07/2015      PT End of Session - 12/07/15 2013    Visit Number 13   Number of Visits 17   Date for PT Re-Evaluation 12/06/15   Authorization Type [employer] P[employer]-Auth Required; G-code every 10th visit   Authorization Time Period 6/6 initially used, 3/3 used, now received 4 more visits.   Authorization - Visit Number 3   Authorization - Number of Visits 4   PT Start Time [phone]   PT Stop Time 0935   PT Time Calculation (min) 40 min   Equipment Utilized During Treatment --   Activity Tolerance Patient tolerated treatment well   Behavior During Therapy WFL for tasks assessed/performed      Past Medical History:  Diagnosis Date  . Arthritis   . Bradycardia   . Cancer [facility]) 2013   skin cancer  . Depression    ptsd  . Dysrhythmia    chronic slow heart rate  . GERD (gastroesophageal reflux disease)   . Headache(784.0)    tension headaches non recent  . History of chicken pox   . History of kidney stones   . Hypertension    treated with HCTZ  . Parkinson's disease ([place])    dx'ed 15 years ago  . PTSD (post-traumatic stress disorder)   . Shortness of breath dyspnea   . Sleep apnea    doesn't use C-pap  . Varicose veins     Past Surgical History:  Procedure Laterality Date  . CHOLECYSTECTOMY N/A 10/22/2014   Procedure: LAPAROSCOPIC CHOLECYSTECTOMY WITH INTRAOPERATIVE CHOLANGIOGRAM;  Surgeon: Dia Crawford III, MD;  Location: [facility] ORS;  Service: General;  Laterality: N/A;  . cyst removed      from lip as a child  . LUMBAR LAMINECTOMY/DECOMPRESSION MICRODISCECTOMY Bilateral 12/14/2012   Procedure: Bilateral lumbar three-four, four-five decompressive laminotomy/foraminotomy;  Surgeon:  Charlie Pitter, MD;  Location: [place] [facility]UR[facility] ORS;  Service: Neurosurgery;  Laterality: Bilateral;  . PULSE GENERATOR IMPLANT Bilateral 12/13/2013   Procedure: Bilateral implantable pulse generator placement;  Surgeon: Erline Levine, MD;  Location: [place] [facility]UR[facility] ORS;  Service: Neurosurgery;  Laterality: Bilateral;  Bilateral implantable pulse generator placement  . skin cancer removed     from ears,   12 lft arm  rt leg 15  . SUBTHALAMIC STIMULATOR INSERTION Bilateral 12/06/2013   Procedure: SUBTHALAMIC STIMULATOR INSERTION;  Surgeon: Erline Levine, MD;  Location: [place] NEUR[facility] ORS;  Service: Neurosurgery;  Laterality: Bilateral;  Bilateral deep brain stimulator placement    There were no vitals filed for this visit.      Subjective Assessment - 12/07/15 0856    Subjective The patient reports a bad fall last week down the steps (was facing away from steps at top of steps and fell backwards down steps).  The patient had severe abrasions on his back, and increased pain in his low back after fall.  He has good and bad days.     Patient is accompained by: Family member  spouse   Patient Stated Goals Pt's goal for therapy to be able to walk a straight line without falling.   Currently in Pain? Yes   Pain Score 6    Pain Location Shoulder  and low back, both 6/10   Pain Orientation  Right  shoulder   Pain Descriptors / Indicators Aching   Pain Type Chronic pain;Acute pain   Pain Onset Today  made worse with recent fall   Pain Frequency Constant   Aggravating Factors  any movement   Pain Relieving Factors medication            [facility] PT Assessment - 12/07/15 0909      Timed Up and Go Test   Manual TUG (seconds) 15.24  carrying cup of water                      [facility] Adult PT Treatment/Exercise - 12/07/15 2019      Ambulation/Gait   Ambulation/Gait Yes   Ambulation/Gait Assistance 5: Supervision   Ambulation/Gait Assistance Details Ambulates into clinic carrying SPC and soda.    Ambulation Distance (Feet) 200 Feet  300 x 2 reps   Assistive device None   Ambulation Surface Level     Self-Care   Self-Care Other Self-Care Comments   Other Self-Care Comments  PT and patient + patient's spouse discussed variable nature of performance.  The patient is performing HEP intermittently due to back pain and shoulder pain.  He is also waiting to move residences and has boxes surrounding him at home making balance/mobility more challenging.  Discussed ongoing benefit to performing HEP and f/u with community program as he can tolerate.  Discussed/reviewed controlling the variables he could including: med times consistent, use of device, making good safety decisions to prevent injury to self.       Neuro Re-ed    Neuro Re-ed Details  Standing forward reaching balance activities reaching up on toes and reaching overhead (monitoring pain in R shoulder) to encourage anterior lean as patient was maintaining a posterior lean during therapy session and has more frequent posterior loss of balance.  Also performed single limb stance activities using visual cues; marching with ball held anteriorly to engage trunk for upright positioning x 150 feet.                 PT Education - 12/07/15 2012    Education provided Yes   Education Details reviewed options for community exercise to include: parkinson's class @ [place], [employer] (near home), aquatics class (has performed in past).  Patient's wife expresses current barriers of waiting to move, and patient participation.   Person(s) Educated Patient;Spouse   Methods Explanation   Comprehension Verbalized understanding          PT Short Term Goals - 11/12/15 1223      PT SHORT TERM GOAL #1   Title Pt will perform HEP independently for improved balance, transfers, and gait.  TARGET 11/05/15   Baseline Patient has been instructed in HEP and reports performing regularly.  PT to recheck technique/performance to ensure progressing  well.   Time 4   Period Weeks   Status Achieved     PT SHORT TERM GOAL #2   Title Pt will improve TUG score to less than or equal to 13.5 seconds for decreased fall risk.   Baseline Improved from 15.23 seconds at eval to 12.19 seconds today.   Time 4   Period Weeks   Status Achieved     PT SHORT TERM GOAL #3   Title Pt will improve 5x sit<>stand to less than or equal to 14 seconds for decreased fall risk.   Baseline Improved from 17.58 seconds to 13.06 seconds.     Time 4     Period Weeks   Status Achieved     PT SHORT TERM GOAL #4   Title Pt will improve Functional Gait ASsessment to at least 14/30 for decreased fall risk.   Baseline patient improved from 9/30 up to 19/30 demonstrating dec'd fall risk.   Time 4   Period Weeks   Status Achieved     PT SHORT TERM GOAL #5   Title Pt will verbalize understanding of local Parkinson's resources.   Baseline Met on 11/06/2015 per patient's wife report-currently going to [place] intermittently.   Time 4   Period Weeks   Status Achieved           PT Long Term Goals - 12/07/15 0903      PT LONG TERM GOAL #1   Title Pt will verbalize understanding of fall prevention/tips to reduce freezing with gait in home environment.  TARGET 12/06/15   Baseline Fall prevention initiated with education to pt and wife.  11/04/15   Time 8   Period Weeks   Status Partially Met     PT LONG TERM GOAL #2   Title Pt will improve TUG manual to less than or equal to 15 seconds for decreased fall risk.   Baseline Improved to 15.24 seconds.    Time 8   Period Weeks   Status Partially Met     PT LONG TERM GOAL #3   Title Pt will perform at least 8 of 10 reps of sit<>stand transfers from less than 18 inch height, independently and no posterior lean or LOB, for improved transfer efficiency and safety.   Baseline Met on 11/18/2015   Time 8   Period Weeks   Status Achieved     PT LONG TERM GOAL #4   Title Pt will improve Functional GAit Assessment  to at least 19/30 for decreased fall risk.   Baseline Improved from 9/30 up to 17/30 at today's visit.  The patient has variable mobility from session to session.   Time 8   Period Weeks   Status Partially Met     PT LONG TERM GOAL #5   Title Pt will verbalize understanding of ongoing community fitness options upon D/C from PT.   Baseline PT provided [place] exercise classes, power over parkinson's support ground and rock steady brochure (did not think this would be best due to shoulder pain)   Time 8   Period Weeks   Status Achieved               Plan - 12/07/15 2015    Clinical Impression Statement The patient continues to have variable performance day to day.  PT and patient + patient's spouse have discussed variables within patient's control of maintaing consistent medication schedule (recommend discuss further with MD for questions), safety in home, fall prevention, and use of assistive device.  We have also discussed performance of community exercises post d/c from therapy.  PT recommends f/u with free parkinson's screen in 4-6 months.  Patient partially met LTGs for PT and has variable performance from session to session.    PT Next Visit Plan Discharge   Consulted and Agree with Plan of Care Patient      Patient will benefit from skilled therapeutic intervention in order to improve the following deficits and impairments:     Visit Diagnosis: Other abnormalities of gait and mobility  Unsteadiness on feet  Other symptoms and signs involving the nervous system  Muscle weakness (generalized)         G-Codes - 12/07/15 2026    Functional Assessment Tool Used TUG=12.29 seconds; FGA 19/30   Functional Limitation Mobility: Walking and moving around   Mobility: Walking and Moving Around Goal Status (G8979) At least 20 percent but less than 40 percent impaired, limited or restricted   Mobility: Walking and Moving Around Discharge Status (G8980) At least 20 percent but  less than 40 percent impaired, limited or restricted      Problem List Patient Active Problem List   Diagnosis Date Noted  . Left hand pain 10/30/2015  . Fracture, finger, multiple sites 10/30/2015  . Colon cancer screening 07/23/2015  . Screening examination for infectious disease 07/23/2015  . Gout 02/18/2015  . Depression 01/06/2015  . Irritation of eyelid 08/20/2014  . Dupuytren's contracture 08/20/2014  . Cough 07/21/2014  . Lumbar stenosis with neurogenic claudication 06/13/2014  . Preop cardiovascular exam 05/28/2014  . S/P deep brain stimulator placement 05/08/2014  . PD (Parkinson's disease) (HCC) 05/08/2014  . Joint pain 01/22/2014  . Medicare annual wellness visit, initial 01/19/2014  . Advance care planning 01/19/2014  . Parkinson's disease (HCC) 12/13/2013  . PTSD (post-traumatic stress disorder) 06/13/2013  . Erectile dysfunction 06/13/2013  . HLD (hyperlipidemia) 06/13/2013  . Hip pain 06/10/2013  . Pain in joint, shoulder region 06/10/2013  . Right leg swelling 06/03/2013  . Essential hypertension 06/03/2013  . Bradycardia by electrocardiogram 06/03/2013  . Obstructive sleep apnea 03/12/2013  . Spinal stenosis, lumbar region, with neurogenic claudication 12/14/2012    ,, PT 12/07/2015, 8:26 PM  [facility] [address] [place], [place], [address] Phone: [phone]   Fax:  [phone]  Name: Dewaun D Hammill MRN: [id number] Date of Birth: 06/01/1950    

## 2015-12-07 NOTE — Telephone Encounter (Signed)
Letter done, please send to Dr. Marchelle Folks office (neurosurgery).  Thanks.

## 2015-12-07 NOTE — Telephone Encounter (Signed)
My understanding was that the patient should be able to get a MRI done, that his device wouldn't preclude him from having an MRI.  Is that correct?  Is there anything I can do to help get him scheduled?

## 2015-12-07 NOTE — Telephone Encounter (Signed)
I had heard that his device was MRI compatible but that there was an issue with [facility] scanner and he would have to go to [facility] to get the scan done (that is what the [employer] rep told me).

## 2015-12-08 NOTE — Telephone Encounter (Signed)
Letter printed and mailed.  

## 2015-12-11 ENCOUNTER — Ambulatory Visit: Payer: Self-pay | Admitting: Physical Therapy

## 2015-12-24 ENCOUNTER — Other Ambulatory Visit: Payer: Self-pay | Admitting: Family Medicine

## 2016-01-02 ENCOUNTER — Other Ambulatory Visit: Payer: Self-pay | Admitting: Family Medicine

## 2016-01-07 ENCOUNTER — Ambulatory Visit: Payer: Medicare PPO | Admitting: Psychology

## 2016-02-04 ENCOUNTER — Encounter: Payer: Self-pay | Admitting: Primary Care

## 2016-02-04 ENCOUNTER — Ambulatory Visit (INDEPENDENT_AMBULATORY_CARE_PROVIDER_SITE_OTHER): Payer: Medicare PPO | Admitting: Primary Care

## 2016-02-04 VITALS — BP 116/70 | HR 51 | Temp 97.9°F | Ht 69.0 in | Wt 183.1 lb

## 2016-02-04 DIAGNOSIS — J069 Acute upper respiratory infection, unspecified: Secondary | ICD-10-CM

## 2016-02-04 MED ORDER — HYDROCOD POLST-CPM POLST ER 10-8 MG/5ML PO SUER: 5.0000 mL | Freq: Two times a day (BID) | ORAL | 0 refills | Status: DC | PRN

## 2016-02-04 NOTE — Patient Instructions (Addendum)
Your symptoms are representative of a viral illness which will resolve on its own over time. Our goal is to treat your symptoms in order to aid your body in the healing process and to make you more comfortable.   You may take the Tussionex cough suppressant twice daily as needed for cough and rest. Caution this medication contains codeine and will make you feel drowsy.  Try taking Mucinex DM. This will help loosen up the mucous in your chest. Ensure you take this medication with a full glass of water.  Please notify me if you develop persistent fevers of 101, start coughing up green mucous, notice increased fatigue or weakness, or feel worse by Monday next week.   Increase consumption of water intake and rest.  It was a pleasure to see you today!   Upper Respiratory Infection, Adult Most upper respiratory infections (URIs) are a viral infection of the air passages leading to the lungs. A URI affects the nose, throat, and upper air passages. The most common type of URI is nasopharyngitis and is typically referred to as "the common cold." URIs run their course and usually go away on their own. Most of the time, a URI does not require medical attention, but sometimes a bacterial infection in the upper airways can follow a viral infection. This is called a secondary infection. Sinus and middle ear infections are common types of secondary upper respiratory infections. Bacterial pneumonia can also complicate a URI. A URI can worsen asthma and chronic obstructive pulmonary disease (COPD). Sometimes, these complications can require emergency medical care and may be life threatening.  CAUSES Almost all URIs are caused by viruses. A virus is a type of germ and can spread from one person to another.  RISKS FACTORS You may be at risk for a URI if:   You smoke.   You have chronic heart or lung disease.  You have a weakened defense (immune) system.   You are very young or very old.   You have  nasal allergies or asthma.  You work in crowded or poorly ventilated areas.  You work in health care facilities or schools. SIGNS AND SYMPTOMS  Symptoms typically develop 2-3 days after you come in contact with a cold virus. Most viral URIs last 7-10 days. However, viral URIs from the influenza virus (flu virus) can last 14-18 days and are typically more severe. Symptoms may include:   Runny or stuffy (congested) nose.   Sneezing.   Cough.   Sore throat.   Headache.   Fatigue.   Fever.   Loss of appetite.   Pain in your forehead, behind your eyes, and over your cheekbones (sinus pain).  Muscle aches.  DIAGNOSIS  Your health care provider may diagnose a URI by:  Physical exam.  Tests to check that your symptoms are not due to another condition such as:  Strep throat.  Sinusitis.  Pneumonia.  Asthma. TREATMENT  A URI goes away on its own with time. It cannot be cured with medicines, but medicines may be prescribed or recommended to relieve symptoms. Medicines may help:  Reduce your fever.  Reduce your cough.  Relieve nasal congestion. HOME CARE INSTRUCTIONS   Take medicines only as directed by your health care provider.   Gargle warm saltwater or take cough drops to comfort your throat as directed by your health care provider.  Use a warm mist humidifier or inhale steam from a shower to increase air moisture. This may make it easier to  breathe.  Drink enough fluid to keep your urine clear or pale yellow.   Eat soups and other clear broths and maintain good nutrition.   Rest as needed.   Return to work when your temperature has returned to normal or as your health care provider advises. You may need to stay home longer to avoid infecting others. You can also use a face mask and careful hand washing to prevent spread of the virus.  Increase the usage of your inhaler if you have asthma.   Do not use any tobacco products, including  cigarettes, chewing tobacco, or electronic cigarettes. If you need help quitting, ask your health care provider. PREVENTION  The best way to protect yourself from getting a cold is to practice good hygiene.   Avoid oral or hand contact with people with cold symptoms.   Wash your hands often if contact occurs.  There is no clear evidence that vitamin C, vitamin E, echinacea, or exercise reduces the chance of developing a cold. However, it is always recommended to get plenty of rest, exercise, and practice good nutrition.  SEEK MEDICAL CARE IF:   You are getting worse rather than better.   Your symptoms are not controlled by medicine.   You have chills.  You have worsening shortness of breath.  You have brown or red mucus.  You have yellow or brown nasal discharge.  You have pain in your face, especially when you bend forward.  You have a fever.  You have swollen neck glands.  You have pain while swallowing.  You have white areas in the back of your throat. SEEK IMMEDIATE MEDICAL CARE IF:   You have severe or persistent:  Headache.  Ear pain.  Sinus pain.  Chest pain.  You have chronic lung disease and any of the following:  Wheezing.  Prolonged cough.  Coughing up blood.  A change in your usual mucus.  You have a stiff neck.  You have changes in your:  Vision.  Hearing.  Thinking.  Mood. MAKE SURE YOU:   Understand these instructions.  Will watch your condition.  Will get help right away if you are not doing well or get worse.   This information is not intended to replace advice given to you by your health care provider. Make sure you discuss any questions you have with your health care provider.   Document Released: 09/28/2000 Document Revised: 08/19/2014 Document Reviewed: 07/10/2013 Elsevier Interactive Patient Education [employer].

## 2016-02-04 NOTE — Progress Notes (Signed)
Subjective:    Patient ID: [name], male    DOB: Mar 03, 1951, 65 y.o.   MRN: [id number]  HPI  [name] is a 65 year old male who presents today with a chief complaint of cough. He also reports chest congestion, nasal congestion, fatigue, wheezing . His symptoms have been present for 1 week. His cough is productive with whitish sputum. He denies fevers, nausea. He's feeling about the same as he did last week. He's taken Naproxen and one dose of Mucinex with temporary improvement.  Review of Systems  Constitutional: Positive for fatigue. Negative for fever.  HENT: Positive for congestion and postnasal drip. Negative for ear pain, sinus pressure and sore throat.   Respiratory: Positive for cough and wheezing. Negative for shortness of breath.   Cardiovascular: Negative for chest pain.       Past Medical History:  Diagnosis Date  . Arthritis   . Bradycardia   . Cancer [facility]) 2013   skin cancer  . Depression    ptsd  . Dysrhythmia    chronic slow heart rate  . GERD (gastroesophageal reflux disease)   . Headache(784.0)    tension headaches non recent  . History of chicken pox   . History of kidney stones   . Hypertension    treated with HCTZ  . Parkinson's disease ([place])    dx'ed 15 years ago  . PTSD (post-traumatic stress disorder)   . Shortness of breath dyspnea   . Sleep apnea    doesn't use C-pap  . Varicose veins      Social History   Social History  . Marital status: Married    Spouse name: N/A  . Number of children: N/A  . Years of education: N/A   Occupational History  . disabled     2001, [employer]  .      search and rescue helicopter   Social History Main Topics  . Smoking status: Never Smoker  . Smokeless tobacco: Former Systems developer  . Alcohol use 0.0 oz/week     Comment: occasional (twice per month)  . Drug use: No  . Sexual activity: No   Other Topics Concern  . Not on file   Social History Narrative   Previous Therapist, art, [employer]   H/o PTSD.  Prev search  and rescue work   3 kids from prev relationship.    Married 2014    Past Surgical History:  Procedure Laterality Date  . CHOLECYSTECTOMY N/A 10/22/2014   Procedure: LAPAROSCOPIC CHOLECYSTECTOMY WITH INTRAOPERATIVE CHOLANGIOGRAM;  Surgeon: Dia Crawford III, MD;  Location: [facility] ORS;  Service: General;  Laterality: N/A;  . cyst removed      from lip as a child  . LUMBAR LAMINECTOMY/DECOMPRESSION MICRODISCECTOMY Bilateral 12/14/2012   Procedure: Bilateral lumbar three-four, four-five decompressive laminotomy/foraminotomy;  Surgeon: Charlie Pitter, MD;  Location: [place] NEURO ORS;  Service: Neurosurgery;  Laterality: Bilateral;  . PULSE GENERATOR IMPLANT Bilateral 12/13/2013   Procedure: Bilateral implantable pulse generator placement;  Surgeon: Erline Levine, MD;  Location: [place] NEURO ORS;  Service: Neurosurgery;  Laterality: Bilateral;  Bilateral implantable pulse generator placement  . skin cancer removed     from ears,   12 lft arm  rt leg 15  . SUBTHALAMIC STIMULATOR INSERTION Bilateral 12/06/2013   Procedure: SUBTHALAMIC STIMULATOR INSERTION;  Surgeon: Erline Levine, MD;  Location: [place] NEURO ORS;  Service: Neurosurgery;  Laterality: Bilateral;  Bilateral deep brain stimulator placement    Family History  Problem Relation Age of Onset  .  Colon cancer Neg Hx   . Prostate cancer Neg Hx   . Lung cancer Father     Allergies  Allergen Reactions  . Klonopin [Clonazepam] Other (See Comments)    Worsening mood/depression  . Morphine And Related     hallucinations    Current Outpatient Prescriptions on File Prior to Visit  Medication Sig Dispense Refill  . Artificial Tear Ointment (DRY EYES OP) Apply to eye.    Marland Kitchen buPROPion (WELLBUTRIN XL) 150 MG 24 hr tablet TAKE 3 TABLETS DAILY 270 tablet 1  . carbidopa-levodopa (SINEMET IR) 25-100 MG per tablet Take by mouth. 2 AM/ 1 afternoon/ 1 evening    . cholecalciferol (VITAMIN D) 1000 UNITS tablet Take 1,000 Units by mouth daily.    Marland Kitchen escitalopram (LEXAPRO) 10  MG tablet TAKE 1 TABLET DAILY 90 tablet 1  . hydrochlorothiazide (HYDRODIURIL) 25 MG tablet TAKE 1 TABLET DAILY 90 tablet 3  . naproxen (NAPROSYN) 500 MG tablet Take 500 mg by mouth 2 (two) times daily with a meal.    . pravastatin (PRAVACHOL) 20 MG tablet TAKE 1 TABLET EVERY EVENING 90 tablet 3  . oxyCODONE-acetaminophen (PERCOCET/ROXICET) 5-325 MG tablet Take 1-2 tablets by mouth every 6 (six) hours as needed for severe pain (sedation caution). (Patient not taking: Reported on 02/04/2016) 40 tablet 0   No current facility-administered medications on file prior to visit.     BP 116/70   Pulse (!) 51   Temp 97.9 F (36.6 C) (Oral)   Ht 5\' 9"  (1.753 m)   Wt 183 lb 1.9 oz (83.1 kg)   SpO2 96%   BMI 27.04 kg/m    Objective:   Physical Exam  Constitutional: He appears well-nourished.  HENT:  Right Ear: Tympanic membrane and ear canal normal.  Left Ear: Tympanic membrane and ear canal normal.  Nose: No mucosal edema. Right sinus exhibits no maxillary sinus tenderness and no frontal sinus tenderness. Left sinus exhibits no maxillary sinus tenderness and no frontal sinus tenderness.  Mouth/Throat: Oropharynx is clear and moist.  Eyes: Conjunctivae are normal.  Neck: Neck supple.  Cardiovascular: Normal rate and regular rhythm.   Pulmonary/Chest: Effort normal and breath sounds normal. He has no wheezes. He has no rales.  Skin: Skin is warm and dry.          Assessment & Plan:  URI:  Cough, congestion, fatigue x 1 week. Overall feeling about the same. Has not taken much OTC specifically for symptoms. Exam today with clear lungs, normal vital signs, does not appear acutely ill. Suspect viral involvement and will treat with supportive measures. Rx for Tussionex HS for rest as cough is worse at night. Discussed to restart Mucinex for congestion. Fluids, rest, he will call Monday next week if symptoms are worse.  Sheral Flow, NP

## 2016-02-04 NOTE — Progress Notes (Signed)
Pre visit review using our clinic review tool, if applicable. No additional management support is needed unless otherwise documented below in the visit note. 

## 2016-02-10 NOTE — Progress Notes (Signed)
[name] was seen today in the movement disorders clinic for neurologic consultation at the request of Dr. Annette Stable. His PCP is at the [facility] in [place].  The consultation is for the evaluation of Parkinson's disease.  The patient is accompanied by his wife who supplements the history.  The patient previously saw Dr. Jennelle Mcgee at [facility] and I do have several of those records.  Patient was diagnosed with Parkinson's disease about 15 years ago, at the age of [age] years old.  He states that the first sx was not swinging of the R arm and some minor balance changes.  Balance has gotten worse with time.   The patient is currently on carbidopa/levodopa 25/100, 3 at 9am, 2 at 1, 3 at 5pm, 2 at 9 pm and 1 carbidopa/levodopa 25/100 CR at bedtime.  He is also on Mirapex 0.5 mg 3 times a day and selegiline 5 mg daily.  Because of dyskinesia, Dr. Nicki Reaper did talk to him about DBS, but according to Dr. Bary Leriche notes, the patient was skeptical.  He did have a neuropsych evaluation at the [facility] in [place], but it appears that this was more of counseling and no testing.  He did have a nocturnal polysomnogram on 06/20/2010 demonstrating an apnea hypopnea index of 16.  07/11/13 update:  Pt is f/u today re: PD.  This patient is accompanied in the office by his spouse who supplements the history.  He is on carbidopa/levodopa, 3 at 9am, 2 at 1, 3 at 5pm, 2 at 9 pm and takes carbidopa/levodopa CR 25/100  at bedtime.  This is a total daily levodopa dose of 1100 mg per day.  He is on mirapex 0.5 mg three times per day and selegeline 5 mg in the AM.  He states, "I am ready for DBS.  Tell me about what I need to do."  Pt c/o dyskinesias.  Falls on average once per week.  No hallucinations.  No n/v.  No syncope.  States that he saw himself singing at [place] and could not believe how much dyskinesia he had.  He states that when his medications don't work, he "hits a wall."  This is sometimes very unpredictable.  07/24/13:  Present with  wife for on/off test.  Actually went to PT late yesterday afternoon and was off of medication and had a very hard time.  While he has been off medication for 36 hours, his wife reports that she thinks that he has done remarkably well.  There has been minimal tremor and he really hasn't been dragging his leg much.  He expresses desire to proceed with DBS.  10/25/13 update:  Patient presents today with his wife, who supplements the history.  The patient is preop DBS.  He is scheduled for deep brain stimulation surgery on 12/06/2013.  He is scheduled for IPG placement on 12/13/2013.  Since our last visit, he did have neuropsychometric testing.  Results of this were favorable for DBS placement.  It was recommended that he faithfully wear his CPAP.  He just had his preop MRI of the brain done.  Patient is currently taking levodopa 25/100, on the following schedule: 3/2/3/2 and then an additional carbidopa/levodopa 25/100 CR at bedtime.  This is in addition to pramipexole 0.5 mg 3 times a day and selegiline 5 mg in the morning.  Overall doing well.  No hallucinations.  No falls.  Has had some more of his chronic back pain.  01/08/14 update:  The patient  presents today for followup.  He is accompanied by his wife who supplements the history.  The patient underwent bilateral STN DBS on 12/06/2013 with generator placement on 12/13/2013.  The patient's wife reports that his Parkinson's disease symptoms have actually been much better since surgery.  He did go back to taking his previous dose of medication, however.  He is on carbidopa/levodopa 25/100 on the following schedule: 3/2/3/2 and then he takes an additional carbidopa/levodopa 25/100 CR at night.  He is also on Mirapex 0.5 mg 3 times a day as well as selegiline 5 mg in the morning.  He had 2 falls since last visit.  On the one, he actually fell down a few stairs and states that he was actually able to roll onto the carpet.  He did not get hurt.  With the other one,  he states that he was walking on a flat surface and just did not expect there to be a step downward and there was.  Again, he did not get hurt.  Wife reports minor dyskinesia.  They have noted no tremor.  Rigidity has been better.  He has been very sleepy.  Wife thinks that this is because he only sleeps about 4 hours at night because he is trying to do so much during the day.  01/16/14 update:  Patient returns today, accompanied by his wife who supplements the history.  Overall, the patient has been doing well.  He remains very sleepy.  He is not sure if that is from medication.  He refuses to wear the CPAP.  He is supposed to be on a tablet of carbidopa/levodopa 25/100 per day but has dropped down to 6 on his own.  He is on pramipexole 0.5 mg twice per day.  He does not want to start speech therapy until November, because he is going out of town.  He had one fall on the stairs since last visit.  Reports that he has an old house with small steps.  He denies any dyskinesia.  He has had some swelling in the hands, right greater than left.  He has an appointment to discuss this with his primary care physician.  He denies any tremor.    01/22/14 update:  Patient returns today for followup, accompanied by his wife who supplements the history.  Patient called me almost immediately after last visit complaining about dyskinesia and he wanted me to turn his settings back down.  I had told him to instead to decrease his levodopa up to 2 tablets in the morning, one in the afternoon and one in the evening and we had already been weaning his Mirapex.  That stopped the dyskinesias which were in the R arm and he admits that some days he is only taking 3 levodopa per day.  He is sometimes having what sounds like myoclonic jerking in the R arm.  He also c/o "aching" in the joints all over the body "but it doesn't feel like the stiffness of parkinsons disease."  He still has swelling.  He did f/u with PCP but etiology is unknown.     02/17/14 update:  This patient is accompanied in the office by his spouse who supplements the history.  Has had several falls.  Almost fell into the fireplace.  [place] backwards going down the stairs.  No serious injuries but still multiple falls.  Generally occur on the stairs.  Wrecked his [place] as missed the brake.  No tremor but did turn off his device  and notes that tremor will immediately return.  Off the mirapex but still sleeping "all the time."  Wife states that no longer doing the things that he is to work to do.  He is less involved with the church, which he used to be very passionate about.  He is not exercising like he was.  He started speech therapy today.  He had a  Modified barium swallow on 02/05/2014 that demonstrated moderate pharyngeal phase dysphagia and recommended a dysphagia 3 diet (mechanical soft) with nectar thick liquids.  They just got the nectar thick-it today.  His wife thinks that perhaps he is depressed.  She thinks that maybe that is why he is sleeping all the time.the patient also asked me about a referral to orthopedics for a rotator cuff problem on the left.  He is having severe pain, but does not necessarily want surgery.  04/15/14 update:  The patient returns today for follow-up, accompanied by his wife who supplements the history.  He remains on carbidopa/levodopa 25/100, 1 tablet 4 times per day.  He was started on Wellbutrin last visit.  This has been worked up to 300 mg daily.  His mood is much better.  His motivation is much better.  He is sleeping much less.  His balance is not very good.  He has not yet started physical therapy, but plans to next week.  He has finished LSVT loud.  He notices some tremor of the left hand and tremor of the right face.  We did have him try to turn off the DBS to see what would happen with the tremor right face and he got much worse.  He has seen Dr. Maureen Ralphs and had cortisone injections into the right shoulder.  That has helped.  He had an  EMG that demonstrated chronic multilevel radiculopathy from L2-S1 bilaterally.  There was no evidence of peripheral neuropathy.  05/08/13 update:  This patient is accompanied in the office by his spouse who supplements the history.  Overall, the patient is doing well but he is contemplating back surgery.  He saw Dr. Trenton Gammon regarding this and is supposed to have a CT myelogram soon.  He presents today because he is having some tremor of the right face and arm and left arm tremor, especially when he is stressed.  He takes 2 levodopa in the morning, one in the afternoon and one in the evening.  He really does fairly well with that.  Tremor is worse in the evening.  No falls.  He is doing fairly well with Wellbutrin for his mood.  He has put physical therapy on hold.  07/03/14 update:  Patient is accompanied by his wife, who supplements the history.  He had a back surgery on 06/13/2014 by Dr. Trenton Gammon.  Records are reviewed from the surgery.  He had an L1-L2 laminectomy and reexplored the L4-L5 laminectomies with foraminotomies.  The patient was released from rehabilitation on March 11 and subsequently came to our Parkinson's educational event.  At that event, we talked about the fact that DBS can worsen speech and he thinks that perhaps the device has caused some speech difficulties.  His wife also thinks that his mood has gotten worse, especially since his most recent back surgery.  He is doing better in terms of the back pain.  He is weaning off of gabapentin.  Physical therapy is coming into his house.  He has noticed some left hand and right leg tremor.  07/28/14 update:  Pt is  accompanied by his wife, who supplements the history.  He is continuing to have EDS, which he has had long prior to DBS therapy.  He has OSAS but has not been able to tolerate CPAP therapy.  He has no cardiac disease.  We made significant changes to his programing last visit because of stuttering/speech issues and his wife states this is much  better.  He is going to resume ST because of choking issues and that is to restart tomorrow.  He is having some tremor in the L hand and right shoulder.  When asked about mood, his wife states that "he needs an attitude adjustment some days."  She does state that his driving seems much better after his gabapentin was discontinued.  He is not having any lightheadedness.  No hallucinations.  No confusion.  11/28/14 update:  The patient is following up today, accompanied by his wife who supplements the history.  He is on carbidopa/levodopa 25/100, 2 tablets in the morning, one in the afternoon and one in the evening but admits that he really doesn't take it this way and is lucky to get in 3 per day (hadn't taken any when I saw him today at 11:15 pm).  Last visit, I added Provigil because of excessive daytime hypersomnolence and we ultimately tried to increase it over the phone to twice a day dosing, but by June, the patient had discontinued it because he was doing better from this regard.  He called me on June 6, however complaining about acting out his dreams.  We added clonazepam, 0.5 mg, half a tablet at night.  Only a week later the patient had discontinued as he felt that it perhaps worsened his mood but he also thought that it caused diarrhea.  Not long after that, however, the patient ended up in the emergency room with abdominal pain and was diagnosed with a porcelain gallbladder and had surgery to have his gallbladder removed on 10/21/2014.  His wife states today, however, that the klonopin made him seriously depressed.  He did hallucinate with the morphine in the hospital.  His wife states that they saw someone they hadn't seen in a longtime and they commented how well he looked.  He has started PT several times and had to stop for various reasons.  He attended a support group in [place].    04/01/15 update:  The patient is following up today, accompanied by his wife who supplements the history.  The  patient has Parkinson's disease and is status post deep brain stimulator placement.  He remains on carbidopa/levodopa 25/100, 2 tablets in the morning, one in the afternoon and one in the evening but instead of taking the last dose in the evening he will take it at bedtime and instead of taking the one in the middle of the afternoon he will take that late evening.  Therefore, he will end up having tremor in the right hand late in the day.  Overall, he is doing well and "most days you don't know he has PD" according to his wife.  He has been going to physical therapy at [facility].  His wife states that insurance only covered 3 weeks of therapy and then cut him off and his wife is hoping to re-enroll him after the first of the year.    I have reviewed records since our last visit.  Dr. Damita Dunnings increased his Wellbutrin XL from 300 mg to 450 mg on 01/06/2015.  That helped a lot and he  is more like his old self.   The patient has also seen Dr. Ashby Dawes since our last visit.  He has a history of known sleep apnea and has been noncompliant with his CPAP.  Dr. Ashby Dawes recommended a re-titration study and the patient is awaiting that study.  Vivid dreams continue.    07/24/15 update:  The patient follows up today, accompanied by his wife who supplements the history.  I have also reviewed prior records made available to me.  The patient is status post deep brain stimulation to the bilateral STN in August, 2015.  He remains on carbidopa/levodopa 25/100, 2 tablets three times a day.  His biggest issue has been depression.  He is on Wellbutrin XL, 450 mg daily.  Just 2 days ago his primary care physician added Lexapro.  It was sent to the mail order pharmacy and he has not gotten it yet.  He thought he was supposed to stop his Wellbutrin and asked me about that today.  He is also under the care of a counselor.  He states that he saw her yesterday.  He is not suicidal or homicidal.  His primary care physician also gave him  a prescription for a new walker because he could not use his old walker in his house because of the size.  His wife does state that he has had more difficulty with choking and swallowing.  He really is noncompliant, however, with recommendations on his prior swallowing study.  He eats whatever he wants and prior swallowing study recommended nectar thick liquids.  He does not follow this.  He is not exercising.  10/12/15 update: The patient follows up today, accompanied by his wife who supplements the history.  I have also reviewed prior records made available to me.  The patient is status post deep brain stimulation to the bilateral STN in August, 2015.  He was on carbidopa/levodopa 25/100, 2 tablets 3 times per day but he has dropped it back to 2/1/1 and states that he is doing well with that.  He did fall into a chest of drawers in May and hit his battery on the right.  That made him sore, but no hematoma.  He also fell in his shop and fell into a lampshade.   He has had shoulder pain and is seeing ortho.  Was told that he cannot have MRI so they are doing CT on wed at [facility] imaging. They are planning to move to a one level house and they will only need to put in ramps.  They plan to change the bathroom entrances to widen them and then raise toilet seats.  He just started physical therapy on 10/07/2015.  He did have a modified barium swallow on 08/07/2015.  It demonstrated mild oral pharyngeal dysphagia (an improvement from 2015 examination) and a regular diet with thin liquids was recommended.  He is on Wellbutrin XL, 450 mg daily and right after last visit, started on Lexapro for depression. He is also under the care of a counselor.   Wife states that mood is good except when he falls.  Still having vivid dreams.  He is also having back pain again and plans to have CT.  02/11/16 update:  The patient follows up, accompanied by his wife who supplements the history.  [place]  [place].  He mows the lawn on a riding mower.  The patient is status post deep brain stimulation in  August, 2015.  He is on carbidopa/levodopa 25/100, 2 tablets in the morning, one in the afternoon and one in the evening.  Fell in July and had finger fx. No surgery, just splint.  Reviewed records in that regard.  [place] down stairs in august. New house does have stairs into the house and has fallen going in.  Awaiting a ramp but it is expensive.  He went to PT in august.  Still having LBP and has been trying to get MRI lumbar spine but needs to be done at [facility]. They attempted this but pt refused to have DBS off to have it done.   In regards to depression, pt is on wellbutrin XL 450 mg daily and Lexapro was started since our last after visit and it has helped.     Neuroimaging has  previously been performed.  It is not available for my review today.  PREVIOUS MEDICATIONS: Sinemet, Sinemet CR, Mirapex and Eldpryl; klonopin (depressed)  ALLERGIES:   Allergies  Allergen Reactions  . Klonopin [Clonazepam] Other (See Comments)    Worsening mood/depression  . Morphine And Related     hallucinations    CURRENT MEDICATIONS:    Medication List       Accurate as of 02/11/16  8:45 AM. Always use your most recent med list.          buPROPion 150 MG 24 hr tablet Commonly known as:  WELLBUTRIN XL TAKE 3 TABLETS DAILY   carbidopa-levodopa 25-100 MG tablet Commonly known as:  SINEMET IR Take by mouth. 2 AM/ 1 afternoon/ 1 evening   chlorpheniramine-HYDROcodone 10-8 MG/5ML Suer Commonly known as:  TUSSIONEX PENNKINETIC ER Take 5 mLs by mouth every 12 (twelve) hours as needed for cough.   cholecalciferol 1000 units tablet Commonly known as:  VITAMIN D Take 1,000 Units by mouth daily.   dextromethorphan-guaiFENesin 30-600 MG 12hr tablet Commonly known as:  MUCINEX DM Take 1 tablet by mouth 2 (two) times daily.   DRY EYES OP Apply to eye.   escitalopram 10 MG  tablet Commonly known as:  LEXAPRO TAKE 1 TABLET DAILY   hydrochlorothiazide 25 MG tablet Commonly known as:  HYDRODIURIL TAKE 1 TABLET DAILY   naproxen 500 MG tablet Commonly known as:  NAPROSYN Take 500 mg by mouth 2 (two) times daily with a meal.   oxyCODONE-acetaminophen 5-325 MG tablet Commonly known as:  PERCOCET/ROXICET Take 1-2 tablets by mouth every 6 (six) hours as needed for severe pain (sedation caution).   pravastatin 20 MG tablet Commonly known as:  PRAVACHOL TAKE 1 TABLET EVERY EVENING        PAST MEDICAL HISTORY:   Past Medical History:  Diagnosis Date  . Arthritis   . Bradycardia   . Cancer [facility]) 2013   skin cancer  . Depression    ptsd  . Dysrhythmia    chronic slow heart rate  . GERD (gastroesophageal reflux disease)   . Headache(784.0)    tension headaches non recent  . History of chicken pox   . History of kidney stones   . Hypertension    treated with HCTZ  . Parkinson's disease ([place])    dx'ed 15 years ago  . PTSD (post-traumatic stress disorder)   . Shortness of breath dyspnea   . Sleep apnea    doesn't use C-pap  . Varicose veins     PAST SURGICAL HISTORY:   Past Surgical History:  Procedure Laterality Date  . CHOLECYSTECTOMY N/A 10/22/2014   Procedure: LAPAROSCOPIC CHOLECYSTECTOMY  WITH INTRAOPERATIVE CHOLANGIOGRAM;  Surgeon: Dia Crawford III, MD;  Location: [facility] ORS;  Service: General;  Laterality: N/A;  . cyst removed      from lip as a child  . LUMBAR LAMINECTOMY/DECOMPRESSION MICRODISCECTOMY Bilateral 12/14/2012   Procedure: Bilateral lumbar three-four, four-five decompressive laminotomy/foraminotomy;  Surgeon: Charlie Pitter, MD;  Location: [place] NEURO ORS;  Service: Neurosurgery;  Laterality: Bilateral;  . PULSE GENERATOR IMPLANT Bilateral 12/13/2013   Procedure: Bilateral implantable pulse generator placement;  Surgeon: Erline Levine, MD;  Location: [place] NEURO ORS;  Service: Neurosurgery;  Laterality: Bilateral;  Bilateral implantable pulse  generator placement  . skin cancer removed     from ears,   12 lft arm  rt leg 15  . SUBTHALAMIC STIMULATOR INSERTION Bilateral 12/06/2013   Procedure: SUBTHALAMIC STIMULATOR INSERTION;  Surgeon: Erline Levine, MD;  Location: [place] NEURO ORS;  Service: Neurosurgery;  Laterality: Bilateral;  Bilateral deep brain stimulator placement    SOCIAL HISTORY:   Social History   Social History  . Marital status: Married    Spouse name: N/A  . Number of children: N/A  . Years of education: N/A   Occupational History  . disabled     2001, [employer]  .      search and rescue helicopter   Social History Main Topics  . Smoking status: Never Smoker  . Smokeless tobacco: Former Systems developer  . Alcohol use 0.0 oz/week     Comment: occasional (twice per month)  . Drug use: No  . Sexual activity: No   Other Topics Concern  . 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    Married 2014    FAMILY HISTORY:   Family Status  Relation Status  . Mother Alive   Arthritis, unknown medical hx  . Father Deceased   Lung Cancer  . Sister Alive   1 and one half sister  . Brother Alive  . Son Alive  . Daughter Alive   2    ROS:  A complete 10 system review of systems was obtained and was unremarkable apart from what is mentioned above.  PHYSICAL EXAMINATION:    VITALS:   Vitals:   02/11/16 0818  BP: 112/70  Pulse: (!) 48  Weight: 183 lb (83 kg)  Height: 5\' 9"  (1.753 m)   Wt Readings from Last 3 Encounters:  02/11/16 183 lb (83 kg)  02/04/16 183 lb 1.9 oz (83.1 kg)  11/30/15 184 lb (83.5 kg)     GEN:  The patient appears stated age and is in NAD.   Affect more flat today. HEENT:  Normocephalic, atraumatic.  The mucous membranes are moist. The superficial temporal arteries are without ropiness or tenderness. Cardiovascular: bradycardic.  Regular rhythym. Lungs: Clear to auscultation  bilaterally   Neurological examination:  Orientation: The patient is alert and oriented x3.  He is awake  Cranial nerves: There is good facial symmetry.  The speech is fluent but dysarthric.    Soft palate rises symmetrically and there is no tongue deviation. Hearing is intact to conversational tone. Sensation: Sensation is intact to light touch throughout. Motor: Strength is 5/5 in the bilateral upper and lower extremities.   Shoulder shrug is equal and symmetric.  There is no pronator drift. Deep tendon reflexes: Deferred today.  Movement examination: Tone: There is normal tone today Abnormal movements: There is no tremor and  no dyskinesia today. Coordination: There is minor decremation with hand opening and closing on the right Gait and Station: The patient has no difficulty arising out of a deep-seated chair without the use of the hands. The patient's stride length is good.  Is not dragging the leg.  Both arm swing well.  Is a little ataxic and unsteady especially with turns.  DBS programming was performed today which is described in more detail on a separate programming procedural notes.   ASSESSMENT/PLAN:  1.  Parkinsons disease by hx. He has been seeing Dr. Jennelle Mcgee through the [facility].    -The patient is status post bilateral STN DBS on 12/06/2013 with battery placement on 12/13/2013.  -off of mirapex  -continue carbidopa/levodopa 25/100, 2/1/1.    -he is off of selegiline.    -RX lift chair  -use walker at all times  -call church/[employer] to look into building of ramp onto house  -will send PT referral in feb 2.  LBP.   -Status post repeat L4-L5 laminotomy with foraminotomy and L1-L2 laminectomy on 06/13/2014.  Back pain has returned and he is to have CT.  Reminded him that he can have specialized MRI but patient resistant b/c doesn't want to have DBS turned off to have done. 3.  Dysphagia  -He had a  Modified barium swallow on 02/05/2014 that  demonstrated moderate pharyngeal phase dysphagia and recommended a dysphagia 3 diet (mechanical soft) with nectar thick liquids.  He is not following these recommendations but is choking more.  Will reschedule a modified barium swallow study.  4.  OSAS with excessive daytime hypersomnolence.  -We talked about morbidity and mortality associated with untreated sleep apnea.    He was not able to tolerate the cpap despite trying multiple masks.   Sleeping for hours in day but still sleeping well at night.  5.  Depression  -on Wellbutrin XL, 450 mg and lexapro and doing better.  -felt near suicidal on klonopin 6.  RBD  -Still acting out the dreams some, but felt almost near suicidal on clonazepam.  Try to add melatonin 30 min-1hr prior to sleep 7. Bradycardia  -log pulse daily and send to me and Elsie Stain, MD 8.  Follow-up in the next 5-6 months, sooner should new neurologic issues arise.  Much greater than 50% of this visit was spent in counseling with the patient and the family.  Total face to face time:  25 min not including programming time

## 2016-02-11 ENCOUNTER — Encounter: Payer: Self-pay | Admitting: Neurology

## 2016-02-11 ENCOUNTER — Ambulatory Visit (INDEPENDENT_AMBULATORY_CARE_PROVIDER_SITE_OTHER): Payer: Medicare PPO | Admitting: Neurology

## 2016-02-11 VITALS — BP 112/70 | HR 48 | Ht 69.0 in | Wt 183.0 lb

## 2016-02-11 DIAGNOSIS — M5417 Radiculopathy, lumbosacral region: Secondary | ICD-10-CM | POA: Diagnosis not present

## 2016-02-11 DIAGNOSIS — G4752 REM sleep behavior disorder: Secondary | ICD-10-CM | POA: Diagnosis not present

## 2016-02-11 DIAGNOSIS — G20 Parkinson's disease: Secondary | ICD-10-CM

## 2016-02-11 DIAGNOSIS — Z9689 Presence of other specified functional implants: Secondary | ICD-10-CM | POA: Diagnosis not present

## 2016-02-11 MED ORDER — AMBULATORY NON FORMULARY MEDICATION: 1.0000 | Freq: Every day | 0 refills | Status: DC

## 2016-02-11 NOTE — Procedures (Signed)
DBS Programming was performed.    Total time spent programming was 20 minutes.  Device was confirmed to be on.  Soft start was confirmed to be on at 8.  Impedences were checked and were within normal limits.  Battery was checked and was determined to be functioning normally and not near the end of life.   This was all documented on a separate neurophysiologic worksheet.  Final settings were as follows:  Left brain electrode:     1-C+           ; Amplitude  4.0   V   ; Pulse width 90 microseconds;   Frequency   130   Hz.  Right brain electrode:     2-C+          ; Amplitude   4.3  V ;  Pulse width 60  microseconds;  Frequency   140    Hz.   

## 2016-02-11 NOTE — Patient Instructions (Signed)
Log your pulse daily and let us know the readings.   Restart physical therapy in February  Follow up in 5 months

## 2016-02-15 ENCOUNTER — Telehealth: Payer: Self-pay | Admitting: Clinical

## 2016-02-15 NOTE — Telephone Encounter (Signed)
Clinical Social Work Telephone Note  CSW received request from Margarette Asal, [place] to contact pt as pt is a patient representative for DBS surgery and inquire if pt would be willing to come to office to meet a patient that is interested in DBS surgery, but would like to meet an individual that has had the surgery. CSW contacted pt home telephone number and spoke with pt wife and pt wife had pt on speaker phone. CSW discussed that another patient was interested in meeting someone that has had DBS surgery to ask questions and see battery placement. Pt agreed to be patient representative to meet other patient and pt wife will be present at meeting as well to help pt communicate as pt wife reports that sometimes it is difficult to understand pt. Pt and pt wife identified that they were available to meet at Dr. Doristine Devoid office at [facility] on Wednesday, November 1st at 1:00pm.   CSW contacted patient requesting meeting that is interested in DBS surgery and spoke to patient's wife who confirmed that date and time would work for patient interested in DBS surgery.   Mr. Rutkowski wife requested information on update about [place] PD support group information. CSW e-mailed information to patient's wife.   Alison Murray, MSW, LCSW Clinical Social Worker [facility] [phone]

## 2016-02-29 DIAGNOSIS — Z1283 Encounter for screening for malignant neoplasm of skin: Secondary | ICD-10-CM | POA: Diagnosis not present

## 2016-02-29 DIAGNOSIS — Z09 Encounter for follow-up examination after completed treatment for conditions other than malignant neoplasm: Secondary | ICD-10-CM | POA: Diagnosis not present

## 2016-02-29 DIAGNOSIS — Z872 Personal history of diseases of the skin and subcutaneous tissue: Secondary | ICD-10-CM | POA: Diagnosis not present

## 2016-03-02 ENCOUNTER — Encounter: Payer: Self-pay | Admitting: Family Medicine

## 2016-03-02 ENCOUNTER — Ambulatory Visit (INDEPENDENT_AMBULATORY_CARE_PROVIDER_SITE_OTHER)
Admission: RE | Admit: 2016-03-02 | Discharge: 2016-03-02 | Disposition: A | Discharge status: Home / Self Care | Payer: Medicare PPO | Source: Ambulatory Visit | Attending: Family Medicine | Admitting: Family Medicine

## 2016-03-02 ENCOUNTER — Ambulatory Visit (INDEPENDENT_AMBULATORY_CARE_PROVIDER_SITE_OTHER): Payer: Medicare PPO | Admitting: Family Medicine

## 2016-03-02 VITALS — BP 110/60 | HR 52 | Temp 97.9°F | Ht 69.0 in | Wt 183.8 lb

## 2016-03-02 DIAGNOSIS — M25551 Pain in right hip: Secondary | ICD-10-CM

## 2016-03-02 DIAGNOSIS — M7061 Trochanteric bursitis, right hip: Secondary | ICD-10-CM

## 2016-03-02 NOTE — Progress Notes (Signed)
Pre visit review using our clinic review tool, if applicable. No additional management support is needed unless otherwise documented below in the visit note. 

## 2016-03-02 NOTE — Patient Instructions (Signed)
Go to the lab on the way out.  We'll contact you with your xray report. Naproxen with food.  Use 1/2 tab of oxycodone if needed.  Sedation caution.  Ice the area.  The bruising should gradually resolve, but may still spread out for the next few days.  Take care.  Glad to see you.

## 2016-03-02 NOTE — Progress Notes (Signed)
Mult falls.  Hit L scalp last week, scabbed over in the meantime.  No LOC.    Another fall, also ast week  R hip pain in the meantime.  Fell into a bush at home.  No LOC.  Pain walking.  No pain at rest.  Lateral pain.  Bruised locally.  Minimal pain with ROM at rest.  D/w pt about fall cautions.  In the new house, getting a ramp built.  The new house is overall safer than the old house. Has rails to use.  D/w pt about his housing/gait situation, fall cautions.    No recent oxycodone use.  Taking naproxen as needed for pain.    Meds, vitals, and allergies reviewed.   ROS: Per HPI unless specifically indicated in ROS section   nad ncat except for linear scab on the L side of scalp w/o bruising.   Speech at baseline Neck supple  rrr ctab abd soft R greater troch area with local bruising, ttp, but able to bear weight.  He does not have right hip pain on internal rotation of the hip.

## 2016-03-03 ENCOUNTER — Telehealth: Payer: Self-pay | Admitting: Family Medicine

## 2016-03-03 DIAGNOSIS — M7060 Trochanteric bursitis, unspecified hip: Secondary | ICD-10-CM | POA: Insufficient documentation

## 2016-03-03 NOTE — Telephone Encounter (Signed)
Patient's wife returned Lugene's call.

## 2016-03-03 NOTE — Assessment & Plan Note (Signed)
Likely trochanteric bursitis, traumatic. Local bruising. I would expect the bruising to gradually spread out but become lighter and then resolve. No fracture seen on imaging. See notes on imaging. Ice the area. Fall cautions discussed with patient. Discussed with patient about pain medicine. If he is in significant pain he can use oxycodone if he has to, taking the least amount possible. Otherwise he can use naproxen with routine cautions. He agrees.

## 2016-03-23 ENCOUNTER — Ambulatory Visit (INDEPENDENT_AMBULATORY_CARE_PROVIDER_SITE_OTHER): Payer: Medicare PPO | Admitting: *Deleted

## 2016-03-23 DIAGNOSIS — Z23 Encounter for immunization: Secondary | ICD-10-CM

## 2016-03-29 DIAGNOSIS — H04123 Dry eye syndrome of bilateral lacrimal glands: Secondary | ICD-10-CM | POA: Diagnosis not present

## 2016-03-29 DIAGNOSIS — H2513 Age-related nuclear cataract, bilateral: Secondary | ICD-10-CM | POA: Diagnosis not present

## 2016-03-29 DIAGNOSIS — D3132 Benign neoplasm of left choroid: Secondary | ICD-10-CM | POA: Diagnosis not present

## 2016-03-31 ENCOUNTER — Ambulatory Visit: Payer: Medicare PPO | Attending: Neurology | Admitting: Physical Therapy

## 2016-03-31 ENCOUNTER — Ambulatory Visit: Payer: Medicare PPO

## 2016-03-31 ENCOUNTER — Ambulatory Visit: Payer: Medicare PPO | Admitting: Occupational Therapy

## 2016-03-31 ENCOUNTER — Telehealth: Payer: Self-pay | Admitting: Neurology

## 2016-03-31 DIAGNOSIS — R278 Other lack of coordination: Secondary | ICD-10-CM

## 2016-03-31 DIAGNOSIS — G20 Parkinson's disease: Secondary | ICD-10-CM

## 2016-03-31 DIAGNOSIS — R2689 Other abnormalities of gait and mobility: Secondary | ICD-10-CM

## 2016-03-31 DIAGNOSIS — R1310 Dysphagia, unspecified: Secondary | ICD-10-CM | POA: Insufficient documentation

## 2016-03-31 DIAGNOSIS — R471 Dysarthria and anarthria: Secondary | ICD-10-CM | POA: Insufficient documentation

## 2016-03-31 NOTE — Telephone Encounter (Signed)
Referral entered  

## 2016-03-31 NOTE — Therapy (Signed)
[facility] [address] [place], [place], [address] Phone: [phone]   Fax:  [phone]  Patient Details  Name: [name] MRN: [id number] Date of Birth: Nov 30, 1950 Referring Provider:  Alonza Bogus, D.O.  Encounter Date: 03/31/2016  Speech Therapy Parkinson's Disease Screen   Decibel Level today: 69dB  (WNL=70-72 dB) with sound level meter 30cm away from pt's mouth, given the external cues of sound level meter placed on the table in front of pt. Pt's speech exhibited short rushes of speech, decreasing pt's speech intelligibility.  Prior to sitting at table with sound level meter, pt's speech was largely unintelligible due to decr'd speech clarity and speech volume.   Pt has experienced difficulty in swallowing warranting objective evaluation (MBS or FEES), however he is not following precautions provided after a MBS in April 2017.  Pt would benefit from speech-language eval for dysarthria - please order via EPIC or call [phone] to schedule    [place] ,[place], [place]  03/31/2016, 11:16 AM  [facility] [address] [place], [place], [address] Phone: [phone]   Fax:  [phone]

## 2016-03-31 NOTE — Telephone Encounter (Signed)
-----   Message from Hulda Marin, OT sent at 03/31/2016 11:12 AM EST ----- Dr. Carles Collet, Mr. Klinker was seen for a multi disciplinary PD screen.  He can benefit from PT,OT and ST evaluations to address changes in ADLs, balance and speech volume, clarity and swallowing. If you agree please make these referral. Sincerely, Theone Murdoch, OTR/L

## 2016-03-31 NOTE — Therapy (Signed)
[facility] [address] [place], [place], [address] Phone: [phone]   Fax:  [phone]  Patient Details  Name: [name] MRN: [id number] Date of Birth: Oct 16, 1950 Referring Provider:  Tonia Ghent, MD  Encounter Date: 03/31/2016  Physical Therapy Parkinson's Disease Screen   Timed Up and Go test:  14.68 sec  10 meter walk test: 13. 78 sec (2.38 ft/sec)  5 time sit to stand test:  13.78 sec (with significant posterior lean)  Pt describes at least 1-2 falls per week.  Patient would benefit from Physical Therapy evaluation due to slowed mobility scores and increased incidence of falls.      Cayton Cuevas W. 03/31/2016, 8:06 AM  Frazier Butt., PT [facility] [address] [place], [place], [address] Phone: [phone]   Fax:  [phone]

## 2016-03-31 NOTE — Therapy (Signed)
[facility] [address] [place], [place], [address] Phone: [phone]   Fax:  [phone]  Occupational Therapy Treatment  Patient Details  Name: [name] MRN: [id number] Date of Birth: June 18, 1950 No Data Recorded  Encounter Date: 03/31/2016    Past Medical History:  Diagnosis Date  . Arthritis   . Bradycardia   . Cancer [facility]) 2013   skin cancer  . Depression    ptsd  . Dysrhythmia    chronic slow heart rate  . GERD (gastroesophageal reflux disease)   . Headache(784.0)    tension headaches non recent  . History of chicken pox   . History of kidney stones   . Hypertension    treated with HCTZ  . Parkinson's disease ([place])    dx'ed 15 years ago  . PTSD (post-traumatic stress disorder)   . Shortness of breath dyspnea   . Sleep apnea    doesn't use C-pap  . Varicose veins     Past Surgical History:  Procedure Laterality Date  . CHOLECYSTECTOMY N/A 10/22/2014   Procedure: LAPAROSCOPIC CHOLECYSTECTOMY WITH INTRAOPERATIVE CHOLANGIOGRAM;  Surgeon: Dia Crawford III, MD;  Location: [facility] ORS;  Service: General;  Laterality: N/A;  . cyst removed      from lip as a child  . LUMBAR LAMINECTOMY/DECOMPRESSION MICRODISCECTOMY Bilateral 12/14/2012   Procedure: Bilateral lumbar three-four, four-five decompressive laminotomy/foraminotomy;  Surgeon: Charlie Pitter, MD;  Location: [facility];  Service: Neurosurgery;  Laterality: Bilateral;  . PULSE GENERATOR IMPLANT Bilateral 12/13/2013   Procedure: Bilateral implantable pulse generator placement;  Surgeon: Erline Levine, MD;  Location: [facility];  Service: Neurosurgery;  Laterality: Bilateral;  Bilateral implantable pulse generator placement  . skin cancer removed     from ears,   12 lft arm  rt leg 15  . SUBTHALAMIC STIMULATOR INSERTION Bilateral 12/06/2013   Procedure: SUBTHALAMIC STIMULATOR INSERTION;  Surgeon: Erline Levine, MD;  Location: [facility];  Service:  Neurosurgery;  Laterality: Bilateral;  Bilateral deep brain stimulator placement    There were no vitals filed for this visit.                      Occupational Therapy Parkinson's Disease Screen  Physical Performance Test item #2 (simulated eating):  20.37 sec   9-hole peg test:    RUE  48.19 sec        LUE  41.81 sec  Change in ability to perform ADLs/IADLs:  Yes, unable to perfrom household tasks like he did previously. Handwriting demonstrates decreased legibility.  Other Comments:  Pt is noted to hold spoon vertically and pt demonstrates difficulty manipulating pegs in hand.  Pt would benefit from occupational therapy evaluation due to  change in ADLs.               Patient will benefit from skilled therapeutic intervention in order to improve the following deficits and impairments:   coordination, bradykinesia,   Visit Diagnosis: Other lack of coordination    Problem List Patient Active Problem List   Diagnosis Date Noted  . Trochanteric bursitis 03/03/2016  . Left hand pain 10/30/2015  . Fracture, finger, multiple sites 10/30/2015  . Colon cancer screening 07/23/2015  . Screening examination for infectious disease 07/23/2015  . Gout 02/18/2015  . Depression 01/06/2015  . Irritation of eyelid 08/20/2014  . Dupuytren's contracture 08/20/2014  . Cough 07/21/2014  . Lumbar stenosis with neurogenic claudication 06/13/2014  . Preop cardiovascular exam 05/28/2014  .  S/P deep brain stimulator placement 05/08/2014  . PD (Parkinson's disease) ([place]) 05/08/2014  . Joint pain 01/22/2014  . Medicare annual wellness visit, initial 01/19/2014  . Advance care planning 01/19/2014  . Parkinson's disease ([place]) 12/13/2013  . PTSD (post-traumatic stress disorder) 06/13/2013  . Erectile dysfunction 06/13/2013  . HLD (hyperlipidemia) 06/13/2013  . Hip pain 06/10/2013  . Pain in joint, shoulder region 06/10/2013  . Right leg swelling 06/03/2013  .  Essential hypertension 06/03/2013  . Bradycardia by electrocardiogram 06/03/2013  . Obstructive sleep apnea 03/12/2013  . Spinal stenosis, lumbar region, with neurogenic claudication 12/14/2012    RINE,KATHRYN 03/31/2016, 8:52 AM  [facility] [address] [place], [place], [address] Phone: [phone]   Fax:  [phone]  Name: [name] MRN: [id number] Date of Birth: 03/23/1951

## 2016-04-05 ENCOUNTER — Ambulatory Visit: Payer: Medicare PPO | Admitting: Physical Therapy

## 2016-04-07 ENCOUNTER — Ambulatory Visit (INDEPENDENT_AMBULATORY_CARE_PROVIDER_SITE_OTHER): Payer: Medicare PPO | Admitting: Psychology

## 2016-04-07 DIAGNOSIS — F4323 Adjustment disorder with mixed anxiety and depressed mood: Secondary | ICD-10-CM | POA: Diagnosis not present

## 2016-04-12 ENCOUNTER — Ambulatory Visit: Payer: Self-pay | Admitting: Occupational Therapy

## 2016-04-12 ENCOUNTER — Ambulatory Visit: Payer: Self-pay

## 2016-04-12 ENCOUNTER — Ambulatory Visit: Payer: Self-pay | Admitting: Physical Therapy

## 2016-04-19 ENCOUNTER — Ambulatory Visit: Payer: Medicare PPO

## 2016-04-19 ENCOUNTER — Ambulatory Visit: Payer: Medicare PPO | Admitting: Occupational Therapy

## 2016-04-19 ENCOUNTER — Telehealth: Payer: Self-pay | Admitting: Cardiovascular Disease

## 2016-04-19 ENCOUNTER — Ambulatory Visit: Payer: Medicare PPO | Admitting: Physical Therapy

## 2016-04-19 NOTE — Telephone Encounter (Signed)
°*  STAT* If patient is at the pharmacy, call can be transferred to refill team.   1. Which medications need to be refilled? (please list name of each medication and dose if known)  hctz 25 mg po once daily pravastatin 20 mg po daily   2. Which pharmacy/location (including street and city if local pharmacy) is medication to be sent to? [facility]   3. Do they need a 30 day or 90 day supply? [place]

## 2016-04-19 NOTE — Telephone Encounter (Signed)
Pt has not been seen since 2016 we can't refill medications at the time must be seen once a year. Please contact pt for appointment .

## 2016-04-19 NOTE — Telephone Encounter (Signed)
Scheduled 04/21/16 with gollan

## 2016-04-21 ENCOUNTER — Ambulatory Visit: Payer: Self-pay | Admitting: Cardiovascular Disease

## 2016-04-27 ENCOUNTER — Ambulatory Visit: Payer: Self-pay | Admitting: Cardiovascular Disease

## 2016-05-04 ENCOUNTER — Ambulatory Visit: Payer: Medicare PPO | Admitting: Occupational Therapy

## 2016-05-04 ENCOUNTER — Ambulatory Visit: Payer: Medicare PPO

## 2016-05-04 ENCOUNTER — Ambulatory Visit: Payer: Medicare PPO | Admitting: Physical Therapy

## 2016-05-04 DIAGNOSIS — R293 Abnormal posture: Secondary | ICD-10-CM | POA: Diagnosis present

## 2016-05-04 DIAGNOSIS — R2689 Other abnormalities of gait and mobility: Secondary | ICD-10-CM | POA: Diagnosis present

## 2016-05-04 DIAGNOSIS — R278 Other lack of coordination: Secondary | ICD-10-CM | POA: Diagnosis present

## 2016-05-04 DIAGNOSIS — R29898 Other symptoms and signs involving the musculoskeletal system: Secondary | ICD-10-CM | POA: Diagnosis present

## 2016-05-04 DIAGNOSIS — R41844 Frontal lobe and executive function deficit: Secondary | ICD-10-CM | POA: Insufficient documentation

## 2016-05-04 DIAGNOSIS — R41841 Cognitive communication deficit: Secondary | ICD-10-CM | POA: Insufficient documentation

## 2016-05-04 DIAGNOSIS — R471 Dysarthria and anarthria: Secondary | ICD-10-CM | POA: Diagnosis present

## 2016-05-04 DIAGNOSIS — M6281 Muscle weakness (generalized): Secondary | ICD-10-CM | POA: Insufficient documentation

## 2016-05-04 DIAGNOSIS — R2681 Unsteadiness on feet: Secondary | ICD-10-CM | POA: Insufficient documentation

## 2016-05-04 DIAGNOSIS — R29818 Other symptoms and signs involving the nervous system: Secondary | ICD-10-CM | POA: Diagnosis present

## 2016-05-09 ENCOUNTER — Ambulatory Visit: Payer: Medicare PPO | Admitting: Physical Therapy

## 2016-05-09 ENCOUNTER — Ambulatory Visit: Payer: Medicare PPO | Attending: Neurology | Admitting: Occupational Therapy

## 2016-05-09 DIAGNOSIS — R2689 Other abnormalities of gait and mobility: Secondary | ICD-10-CM | POA: Diagnosis not present

## 2016-05-09 DIAGNOSIS — R293 Abnormal posture: Secondary | ICD-10-CM

## 2016-05-09 DIAGNOSIS — R278 Other lack of coordination: Secondary | ICD-10-CM | POA: Diagnosis not present

## 2016-05-09 DIAGNOSIS — R2681 Unsteadiness on feet: Secondary | ICD-10-CM | POA: Diagnosis not present

## 2016-05-09 DIAGNOSIS — R41844 Frontal lobe and executive function deficit: Secondary | ICD-10-CM

## 2016-05-09 DIAGNOSIS — M6281 Muscle weakness (generalized): Secondary | ICD-10-CM

## 2016-05-09 DIAGNOSIS — R29818 Other symptoms and signs involving the nervous system: Secondary | ICD-10-CM

## 2016-05-09 DIAGNOSIS — R471 Dysarthria and anarthria: Secondary | ICD-10-CM | POA: Diagnosis not present

## 2016-05-09 DIAGNOSIS — R29898 Other symptoms and signs involving the musculoskeletal system: Secondary | ICD-10-CM | POA: Diagnosis not present

## 2016-05-09 DIAGNOSIS — R41841 Cognitive communication deficit: Secondary | ICD-10-CM | POA: Diagnosis not present

## 2016-05-09 NOTE — Therapy (Signed)
[facility] [address] [place], [place], [address] Phone: [phone]   Fax:  [phone]  Physical Therapy Evaluation  Patient Details  Name: [name] MRN: [id number] Date of Birth: 04/01/1951 Referring Provider: Alonza Bogus  Encounter Date: 05/09/2016      PT End of Session - 05/09/16 2111    Visit Number 1  New episode of POC   Number of Visits 13   Date for PT Re-Evaluation 07/05/16   Authorization Type [employer] PPO-GCODE every 10th visit   PT Start Time 1019   PT Stop Time 1103   PT Time Calculation (min) 44 min   Equipment Utilized During Treatment Gait belt   Activity Tolerance Patient tolerated treatment well   Behavior During Therapy WFL for tasks assessed/performed      Past Medical History:  Diagnosis Date  . Arthritis   . Bradycardia   . Cancer [facility]) 2013   skin cancer  . Depression    ptsd  . Dysrhythmia    chronic slow heart rate  . GERD (gastroesophageal reflux disease)   . Headache(784.0)    tension headaches non recent  . History of chicken pox   . History of kidney stones   . Hypertension    treated with HCTZ  . Parkinson's disease ([place])    dx'ed 15 years ago  . PTSD (post-traumatic stress disorder)   . Shortness of breath dyspnea   . Sleep apnea    doesn't use C-pap  . Varicose veins     Past Surgical History:  Procedure Laterality Date  . CHOLECYSTECTOMY N/A 10/22/2014   Procedure: LAPAROSCOPIC CHOLECYSTECTOMY WITH INTRAOPERATIVE CHOLANGIOGRAM;  Surgeon: Dia Crawford III, MD;  Location: [facility] ORS;  Service: General;  Laterality: N/A;  . cyst removed      from lip as a child  . LUMBAR LAMINECTOMY/DECOMPRESSION MICRODISCECTOMY Bilateral 12/14/2012   Procedure: Bilateral lumbar three-four, four-five decompressive laminotomy/foraminotomy;  Surgeon: Charlie Pitter, MD;  Location: [facility];  Service: Neurosurgery;  Laterality: Bilateral;  . PULSE GENERATOR IMPLANT  Bilateral 12/13/2013   Procedure: Bilateral implantable pulse generator placement;  Surgeon: Erline Levine, MD;  Location: [facility];  Service: Neurosurgery;  Laterality: Bilateral;  Bilateral implantable pulse generator placement  . skin cancer removed     from ears,   12 lft arm  rt leg 15  . SUBTHALAMIC STIMULATOR INSERTION Bilateral 12/06/2013   Procedure: SUBTHALAMIC STIMULATOR INSERTION;  Surgeon: Erline Levine, MD;  Location: [facility];  Service: Neurosurgery;  Laterality: Bilateral;  Bilateral deep brain stimulator placement    There were no vitals filed for this visit.       Subjective Assessment - 05/09/16 1025    Subjective Pt reports continued falls-wife says more daily now than weekly.  No real pattern to falls.  Occasionally neds to have help to get up from falls.  Primarily uses cane for mobility.  Has U-step RW, but does not use.   Patient is accompained by: Family member  wife   Patient Stated Goals Pt's goal for therapy is to help with legs and with balance.   Currently in Pain? Yes   Pain Score 2    Pain Location Hip   Pain Orientation Right   Pain Descriptors / Indicators Burning   Pain Type Chronic pain   Pain Onset More than a month ago   Pain Frequency Constant   Aggravating Factors  squatting, picking up leg   Pain Relieving Factors  rest   Effect of Pain on Daily Activities PT will attempt to address pain through stretching, exercises,massage/manual techniques if needed            [facility] PT Assessment - 05/09/16 1032      Assessment   Medical Diagnosis Parkinson's disease  December 2017 screen   Referring Provider Tat, Wells Guiles     Precautions   Precautions Fall   Precaution Comments Deep brain stimulator-bilaterally 2015     Balance Screen   Has the patient fallen in the past 6 months Yes   How many times? --  falls daily   Has the patient had a decrease in activity level because of a fear of falling?  Yes   Is the patient reluctant to leave  their home because of a fear of falling?  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  Note several areas of tenderness and tightness along mid-section of IT band.    Note pain along R quads tender to palpation      Transfers   Transfers Sit to Stand;Stand to Sit   Sit to Stand 5: Supervision;Without upper extremity assist;From chair/3-in-1   Five time sit to stand comments  18.93  posterior of legs pushing on chair   Stand to Sit 5: Supervision;Without upper extremity assist;To chair/3-in-1;Uncontrolled descent   Comments Increased posterior lean upon standing     Ambulation/Gait   Ambulation/Gait Yes   Ambulation/Gait Assistance 5: Supervision   Ambulation Distance (Feet) 200 Feet   Assistive device Straight cane   Gait Pattern Step-through pattern;Decreased step length - right;Decreased step length - left;Decreased trunk rotation;Narrow base of support;Decreased arm swing - right;Poor foot clearance - left;Poor foot clearance - right   Ambulation Surface Level;Indoor   Gait velocity 13.50 sec = 2.43 ft/sec   Stairs Yes   Stairs Assistance 6: Modified independent  (Device/Increase time)   Stair Management Technique Two rails;Alternating pattern  Pt c/o quad pain (mid-muscle area) descending steps   Number of Stairs 4     Timed Up and Go Test   Normal TUG (seconds) 15.19   Cognitive TUG (seconds) 21.63   TUG Comments Times decreased from TUG 12.19 sec previous bout of therapy (Scores >13.5-15 seconds indicate increased fall risk.)     Functional Gait  Assessment   Gait assessed  Yes   Gait Level Surface Walks 20 ft, slow speed, abnormal gait pattern, evidence for imbalance or deviates 10-15 in outside of the 12 in walkway width. Requires more than 7 sec to ambulate 20 ft.  10.06   Change in Gait Speed  Able to change speed, demonstrates mild gait deviations, deviates 6-10 in outside of the 12 in walkway width, or no gait deviations, unable to achieve a major change in velocity, or uses a change in velocity, or uses an assistive device.   Gait with Horizontal Head Turns Performs head turns with moderate changes in gait velocity, slows down, deviates 10-15 in outside 12 in walkway width but recovers, can continue to walk.   Gait with Vertical Head Turns Performs task with moderate change in gait velocity, slows down, deviates 10-15 in outside 12 in walkway width but recovers, can continue to walk.   Gait and Pivot Turn Turns slowly, requires verbal cueing, or requires several small steps to catch balance following turn and stop  3.53 sec   Step Over Obstacle Is able to step over one shoe box (4.5 in total height) but must slow down and adjust steps to clear box safely. May require verbal cueing.   Gait with Narrow Base of Support Ambulates 4-7 steps.   Gait with Eyes Closed Walks 20 ft, slow speed, abnormal gait pattern, evidence for imbalance, deviates 10-15 in outside 12 in walkway width. Requires more than 9 sec to ambulate 20 ft.  11.97   Ambulating Backwards Walks 20 ft, slow speed, abnormal gait pattern, evidence for imbalance, deviates 10-15 in  outside 12 in walkway width.  11.15   Steps Alternating feet, must use rail.   Total Score 12   FGA comment: Scores <22/30 indicate increased fall risk; score on FGA has decreased from 17/30 at d/c 10/2015                     Self Care:      PT Education - 05/09/16 2107    Education provided Yes   Education Details Educated wife on massage and ice massage techniques along R IT band   Person(s) Educated Patient;Spouse   Methods Explanation;Demonstration;Verbal cues;Handout   Comprehension Verbalized understanding;Verbal cues required          PT Short Term Goals - 05/09/16 2121      PT SHORT TERM GOAL #1   Title Pt will perform HEP with family's supervision, for improved balance, transfers, and gait.  TARGET 06/08/16   Time 4   Period Weeks   Status New     PT SHORT TERM GOAL #2   Title Pt will perform 5x sit<>stand in less than or equal to 15 seconds for improved efficiency and safety with transfers.   Time 4   Status New     PT SHORT TERM GOAL #3   Title Pt will improve TUG score to less than or equal to 13.5 seconds for decreased fall risk.   Time 4   Period Weeks   Status New     PT SHORT TERM GOAL #4   Title Pt will perform floor>stand transfer with min assist of family member for improved fall recovery.   Time 4   Period Weeks   Status New     PT SHORT TERM GOAL #5   Title Pt will verbalize at least 3 means to decrease pain along R lateral thigh/iliotibial band.   Time 4   Period Weeks   Status New           PT Long Term Goals - 05/09/16 2124      PT LONG TERM GOAL #1   Title Pt/family will verabalize understanding of fall prevention/tips to reduce freezing with gait in the home  environment.  TARGET 07/07/16   Time 6   Period Weeks   Status New     PT LONG TERM GOAL #2   Title Pt will improve gait velocity to at least 2.62 ft/sec for improved community ambulator status (with appropriate assistive device).   Time 6   Period Weeks    Status New     PT LONG TERM GOAL #3   Title Pt will report at least 25% improvement in performance of low surface transfers for improved ability to get in and out of car as well as restaraunt seating.    Time 6   Period Weeks   Status New     PT LONG TERM GOAL #4   Title Pt/wife will verbalize understanding of plans for continued community fitness upon D/C from PT.   Time 6   Period Weeks   Status New     PT LONG TERM GOAL #5   Title Pt will demonstrate proper use and technique of U-step RW for improved household and community gait.   Time 6   Period Weeks   Status New               Plan - 05-18-16 2114    Clinical Impression Statement Pt presents to OP PT with history of Parkinson's disease, with multiple falls (reports of near daily falls).  Pt has PMH significant for bilateral deep brain stimulator placement 2015, spinal stenosis, trochanteric bursitis, depression.  Pt presents to OP PT with recurrent falls, decreased balance, slowed gait velocity, decreased timing and coordination with gait, decreased safety and efficiency with transfers.  Pt is at fall risk per TUG, TUG cognitive, and Functional Gait Assessment scores.  Pt will benefit from skilled physical therapy to address the above stated deficits for improved functional mobility, decreased fall risk.   Rehab Potential Good   PT Frequency 2x / week   PT Duration 6 weeks   PT Treatment/Interventions ADLs/Self Care Home Management;Functional mobility training;Gait training;DME Instruction;Neuromuscular re-education;Patient/family education;Balance training;Therapeutic exercise;Therapeutic activities;Manual techniques   PT Next Visit Plan Review with patient and wife iliotibial band massage; initiate IT band stretches if needed; address transfer technique; gait safety/use of U-Step RW; initiate HEP   Consulted and Agree with Plan of Care Patient;Family member/caregiver   Family Member Consulted spouse      Patient  will benefit from skilled therapeutic intervention in order to improve the following deficits and impairments:  Abnormal gait, Decreased balance, Decreased mobility, Decreased knowledge of use of DME, Decreased safety awareness, Decreased strength, Difficulty walking, Postural dysfunction, Pain  Visit Diagnosis: Other abnormalities of gait and mobility  Unsteadiness on feet  Other symptoms and signs involving the nervous system  Other symptoms and signs involving the musculoskeletal system  Abnormal posture      G-Codes - 05-18-2016 2129    Functional Assessment Tool Used TUG 15.19 sec, TUG cog 21.63 sec; 5x sit<>stand 18.93 sec, gait velocity 2.43 ft/sec; falls daily   Functional Limitation Mobility: Walking and moving around   Mobility: Walking and Moving Around Current Status [phone]) At least 40 percent but less than 60 percent impaired, limited or restricted   Mobility: Walking and Moving Around Goal Status [phone]) At least 20 percent but less than 40 percent impaired, limited or restricted       Problem List Patient Active Problem List   Diagnosis Date Noted  . Trochanteric bursitis 03/03/2016  . Left hand pain 10/30/2015  . Fracture, finger, multiple sites 10/30/2015  . Colon  cancer screening 07/23/2015  . Screening examination for infectious disease 07/23/2015  . Gout 02/18/2015  . Depression 01/06/2015  . Irritation of eyelid 08/20/2014  . Dupuytren's contracture 08/20/2014  . Cough 07/21/2014  . Lumbar stenosis with neurogenic claudication 06/13/2014  . Preop cardiovascular exam 05/28/2014  . S/P deep brain stimulator placement 05/08/2014  . PD (Parkinson's disease) ([place]) 05/08/2014  . Joint pain 01/22/2014  . Medicare annual wellness visit, initial 01/19/2014  . Advance care planning 01/19/2014  . Parkinson's disease ([place]) 12/13/2013  . PTSD (post-traumatic stress disorder) 06/13/2013  . Erectile dysfunction 06/13/2013  . HLD (hyperlipidemia) 06/13/2013  . Hip  pain 06/10/2013  . Pain in joint, shoulder region 06/10/2013  . Right leg swelling 06/03/2013  . Essential hypertension 06/03/2013  . Bradycardia by electrocardiogram 06/03/2013  . Obstructive sleep apnea 03/12/2013  . Spinal stenosis, lumbar region, with neurogenic claudication 12/14/2012    Coralie Stanke W. 05/09/2016, 9:34 PM  Frazier Butt., PT  [facility] [address] [place], [place], [address] Phone: [phone]   Fax:  [phone]  Name: [name] MRN: [id number] Date of Birth: 02-14-1951

## 2016-05-09 NOTE — Patient Instructions (Signed)
Along side of the R hip: 1) gentle pressure with heel of your hand, along the IT band from your hip towards your knee  2) Cross friction massage at the most tender points  3)Ice massage along your IT band (lateral thigh) 5 minutes with gentle pressure along the outside part of the thigh

## 2016-05-10 ENCOUNTER — Encounter: Payer: Self-pay | Admitting: Neurology

## 2016-05-10 ENCOUNTER — Ambulatory Visit (INDEPENDENT_AMBULATORY_CARE_PROVIDER_SITE_OTHER): Payer: Medicare PPO | Admitting: Cardiovascular Disease

## 2016-05-10 ENCOUNTER — Encounter: Payer: Self-pay | Admitting: Cardiovascular Disease

## 2016-05-10 VITALS — BP 110/62 | HR 53

## 2016-05-10 DIAGNOSIS — E782 Mixed hyperlipidemia: Secondary | ICD-10-CM | POA: Diagnosis not present

## 2016-05-10 DIAGNOSIS — Z9689 Presence of other specified functional implants: Secondary | ICD-10-CM

## 2016-05-10 DIAGNOSIS — G20 Parkinson's disease: Secondary | ICD-10-CM

## 2016-05-10 DIAGNOSIS — I10 Essential (primary) hypertension: Secondary | ICD-10-CM | POA: Diagnosis not present

## 2016-05-10 MED ORDER — PRAVASTATIN SODIUM 20 MG PO TABS: 20.0000 mg | ORAL_TABLET | Freq: Every evening | ORAL | 3 refills | Status: DC

## 2016-05-10 NOTE — Therapy (Signed)
[facility] [address] [place], [place], [address] Phone: [phone]   Fax:  [phone]  Occupational Therapy Evaluation  Patient Details  Name: [name] MRN: [id number] Date of Birth: 06/07/1950 Referring Provider: Dr. Wells Guiles Tat  Encounter Date: 05/09/2016      OT End of Session - 05/09/16 1716    Visit Number 1   Number of Visits 17   Date for OT Re-Evaluation 07/07/16   Authorization Type [employer] Medicare / [employer], no auth/visit limit, G-code needed   Authorization - Visit Number 1   Authorization - Number of Visits 10   OT Start Time 0930   OT Stop Time 1015   OT Time Calculation (min) 45 min   Activity Tolerance Patient tolerated treatment well   Behavior During Therapy [facility] for tasks assessed/performed      Past Medical History:  Diagnosis Date  . Arthritis   . Bradycardia   . Cancer [facility]) 2013   skin cancer  . Depression    ptsd  . Dysrhythmia    chronic slow heart rate  . GERD (gastroesophageal reflux disease)   . Headache(784.0)    tension headaches non recent  . History of chicken pox   . History of kidney stones   . Hypertension    treated with HCTZ  . Parkinson's disease ([place])    dx'ed 15 years ago  . PTSD (post-traumatic stress disorder)   . Shortness of breath dyspnea   . Sleep apnea    doesn't use C-pap  . Varicose veins     Past Surgical History:  Procedure Laterality Date  . CHOLECYSTECTOMY N/A 10/22/2014   Procedure: LAPAROSCOPIC CHOLECYSTECTOMY WITH INTRAOPERATIVE CHOLANGIOGRAM;  Surgeon: Dia Crawford III, MD;  Location: [facility] ORS;  Service: General;  Laterality: N/A;  . cyst removed      from lip as a child  . LUMBAR LAMINECTOMY/DECOMPRESSION MICRODISCECTOMY Bilateral 12/14/2012   Procedure: Bilateral lumbar three-four, four-five decompressive laminotomy/foraminotomy;  Surgeon: Charlie Pitter, MD;  Location: [place] NEURO ORS;  Service: Neurosurgery;  Laterality: Bilateral;  .  PULSE GENERATOR IMPLANT Bilateral 12/13/2013   Procedure: Bilateral implantable pulse generator placement;  Surgeon: Erline Levine, MD;  Location: [place] NEURO ORS;  Service: Neurosurgery;  Laterality: Bilateral;  Bilateral implantable pulse generator placement  . skin cancer removed     from ears,   12 lft arm  rt leg 15  . SUBTHALAMIC STIMULATOR INSERTION Bilateral 12/06/2013   Procedure: SUBTHALAMIC STIMULATOR INSERTION;  Surgeon: Erline Levine, MD;  Location: [place] NEURO ORS;  Service: Neurosurgery;  Laterality: Bilateral;  Bilateral deep brain stimulator placement    There were no vitals filed for this visit.      Subjective Assessment - 05/09/16 0937    Subjective  Pt has had incr falls in last month   Patient is accompained by: Family member  wife   Pertinent History Parkinson's diagnosis approx 17 yrs ago, hx of 3 back surgeries (last approx 2 yrs ago), bilateral DBS placement approx 3 yrs ago, multiple falls, R 3rd digit fx 10/2015, HTN, bradycardia, PTSD, lumbar stenosis, HDL, Dupuytren contracture, depression, gout    Limitations Bilateral Deep Brain Stimulator; Fall risk   Patient Stated Goals safety per wife   Currently in Pain? Yes   Pain Score 7   4/10 at rest   Pain Location Hip   Pain Orientation Right   Pain Descriptors / Indicators Shooting   Pain Type Chronic pain   Pain Onset More than  a month ago  made worse with recent fall   Pain Frequency Constant   Aggravating Factors  putting weight on it   Pain Relieving Factors rest   Effect of Pain on Daily Activities OT will monitor as relates to treatment, but will not directly address due to location/nature           [facility] OT Assessment - 05/09/16 0940      Assessment   Diagnosis Parkinsons disease   Referring Provider Dr. Wells Guiles Tat   Onset Date --  incr falls over last month, OT screen 03/31/16   Prior Therapy Physical therapy last fall     Precautions   Precautions Fall  Deep Brain Stimulator bilateral 2015    Precaution Comments Bilateral DBS 2015; Swallowing difficulties (recommended chin tuck, small bites, ?thicken liquids      Balance Screen   Has the patient fallen in the past 6 months Yes   How many times? 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   Leisure --  not currently exercising      ADL   Eating/Feeding Set up  spills with eating, assist for cutting   Grooming Modified independent  sitting   Upper Body Bathing Modified independent   Lower Body Bathing Increased time   Upper Body Dressing Needs assist for fasteners  difficulty with jacket   Upper Body Dressing Patient Percentage --  elastic shoe strings, has button hook   Where Assessed-Upper Body Dressing --  difficulty with how to tie a tie at times   Lower Body Dressing Increased time  hx of falls with dressing   Toilet Tranfer --  difficulty, uncontrolled desent   Toileting - Clothing Manipulation Modified independent   Toileting -  Conservation officer, historic buildings bars;Shower seat without back  but suction (has come off), bath/shower combo     IADL   Prior Level of Function [employer] mows grass on riding lawnmower, vacuums  near fall with pouring gas   Prior Level of Function Meal Prep wife performs     Mobility   Mobility Status History of falls   Mobility Status Comments Pt ambulates with single-point cane.  Pt/wife report that he falls with feet crossing, turning, fall back or to the L  has RW and U-step walker, LOB with sit>stand today (min A)     Written Expression   Dominant Hand Right   Handwriting --  Not assessed     Vision Assessment   Vision  Assessment Vision not tested   Comment Pt reports seeing illusions x71month.  Recommended pt call to tell MD     Cognition   Overall Cognitive Status Impaired/Different from baseline  to be assessed further in functional context prn   Area of Impairment Memory;Safety/judgement   Memory Decreased short-term memory;Decreased recall of precautions   Safety/Judgement Decreased awareness of safety   Memory --   Bradyphrenia --  wife reports confusion, particulary in the afternoons     Observation/Other Assessments   Observations Leans back in sitting/standing   Standing Functional Reach Test R-8", L-6"   Other Surveys  Select   Physical Performance Test  Yes   Simulated Eating Time (seconds) 17.53sec with perpendicular scooping/shoulder compensation   Donning Doffing Jacket Time (seconds) 19.75sec with no trunk movement  LOB with sit>stand for task, min A needed   Donning Doffing Jacket Comments Fastening/unfastening 3 buttons in 53.0sec     Coordination   9 Hole Peg Test Right;Left   Right 9 Hole Peg Test 60.56   Left 9 Hole Peg Test 39.40   Box and Blocks R-29blocks, L-33blocks   Coordination difficulty with in-hand manipulation     Tone   Assessment Location Right Upper Extremity;Left Upper Extremity     ROM / Strength   AROM / PROM / Strength AROM     AROM   Overall AROM  Within functional limits for tasks performed   Overall AROM Comments However pt compensates with leaning back and crossing legs indicating decr posture control; shoulder compensation for fine motor movements  abnormal shoulder positioning/movement patterns noted     RUE Tone   RUE Tone Mild     LUE Tone   LUE Tone Within Functional Limits                         OT Education - 05/09/16 1705    Education provided Yes   Education Details Recommended pt/wife call MD to notify her of pt seeing "illusions"; recommended sitting for dressing (UB/LB) due to fall risk; Recommended pt not  climb on ladders and only get on/off lawnmower/mow with supervision;  POC; recommended pt request assistance for ramp, tub bench, and grab bars from [place]   Person(s) Educated Spouse;Patient   Methods Explanation   Comprehension Verbalized understanding          OT Short Term Goals - 05/10/16 0939      OT SHORT TERM GOAL #1   Title Pt/caregiver will be independent with PD-specific HEP.--check STGs 06/09/16   Time 4   Period Weeks   Status New     OT SHORT TERM GOAL #2   Title Pt will improve coordination for ADLs as shown by improving time on 9-hole peg test by at least 5sec with dominant RUE.   Baseline 60.56sec   Time 4   Period Weeks   Status New     OT SHORT TERM GOAL #3   Title Pt will improve coordination/functional reaching for ADLs as shown by improving box and blocks score by at least 4 bilaterally.   Baseline R-29, L-33 blocks   Time 4   Period Weeks   Status New     OT SHORT TERM GOAL #4   Title Pt will incr ease with eating as shown by completing PPT#2 in 15sec or less.   Baseline 17.53sec   Time 4   Period Weeks   Status New     OT SHORT TERM GOAL #5   Title Pt/caregiver will verbalize ways to prevent future complications and appropriate community resources prn.   Time 4   Period Weeks   Status New           OT Long Term Goals - 05/10/16 0944      OT LONG TERM GOAL #1   Title Pt/caregiver will verbalize understanding of adaptive strategies/equipment to incr ease/safety/independence with ADLs/IADLs.--check LTGs 07/07/16   Baseline -----   Time 8   Period Weeks   Status New     OT LONG TERM GOAL #2   Title Pt will improve coordination for ADLs as shown by improving time  on 9-hole peg test by at least 10sec with dominant RUE.   Baseline 60.56sec   Time 8   Period Weeks   Status New     OT LONG TERM GOAL #3   Title Pt will improve coordination/functional reaching for ADLs as shown by improving box and blocks score by at least 8 with RUE.    Baseline R-29 blocks   Time 8   Period Weeks   Status New     OT LONG TERM GOAL #4   Title Pt will fasten/unfasten 3 buttons in less than 45sec for incr ease with dressing.   Baseline 53.0sec   Time 8   Period Weeks   Status New     OT LONG TERM GOAL #5   Title Pt will improve balance for ADLs/IADLs as shown by improving standing functional reach test by at least 2 inches with LUE.   Baseline R-8", L-6"   Time 8   Period Weeks   Status New     OT LONG TERM GOAL #6   Title ---------   Baseline -------     OT LONG TERM GOAL #7   Title -----   Baseline ------               Plan - 05/10/16 [id number]    Clinical Impression Statement Pt is a 66 y.o. male diagnose with Parkinson's disease.  Per Epic/pt report PMH includes:  Parkinson's diagnosis approx 17 yrs ago, hx of 3 back surgeries (last approx 2 yrs ago), bilateral DBS placement approx 3 yrs ago, multiple falls, R 3rd digit fx 10/2015, HTN, bradycardia, PTSD, lumbar stenosis, HDL, Dupuytren contracture, depression, gout .  Pt/wife report incr falls in last month.  Pt presents with bradykinesia/hypokinesia, rigidity, abnormal posture, decr core stability, decr coordination, cognitive deficits, decr functional mobility for ADLs.  Pt would benefit from occupational therapy for these deficits to improve ADL safety, ease, independence, decr risk of future complications related to PD, and improve quality of life.   Rehab Potential Good   Clinical Impairments Affecting Rehab Potential cognitive deficits, severity of deficits, hip pain   OT Frequency 2x / week   OT Duration 8 weeks  +eval   OT Treatment/Interventions Self-care/ADL training;Moist Heat;Cryotherapy;Therapeutic exercise;Parrafin;DME and/or AE instruction;Therapist, nutritional;Therapeutic activities;Patient/family education;Balance training;Cognitive remediation/compensation;Splinting;Manual Therapy;Neuromuscular education;Ultrasound;Fluidtherapy;Energy  conservation;Passive range of motion;Therapeutic exercises   Plan strategies for ADLs, sitting/supine PWR! with focus on slow, deliberate, controlled movements (holding positions and avoiding extending trunk)   Recommended Other Services Current with PT, scheduled for ST eval   Consulted and Agree with Plan of Care Patient      Patient will benefit from skilled therapeutic intervention in order to improve the following deficits and impairments:  Decreased mobility, Decreased balance, Abnormal gait, Pain, Impaired tone, Impaired perceived functional ability, Decreased knowledge of precautions, Decreased coordination, Decreased activity tolerance, Decreased cognition, Decreased knowledge of use of DME, Decreased safety awareness, Decreased strength, Impaired UE functional use, Improper spinal/pelvic alignment, Improper body mechanics (bradykinesia/hypokinesia)  Visit Diagnosis: Other symptoms and signs involving the nervous system  Other symptoms and signs involving the musculoskeletal system  Other lack of coordination  Other abnormalities of gait and mobility  Unsteadiness on feet  Frontal lobe and executive function deficit  Muscle weakness (generalized)      G-Codes - May 31, 2016 1708    Functional Assessment Tool Used Standing functional reach R-8", L-6"; 9-hole peg test R-60.56sec, L-39.40sec; Box and blocks test:  R-29, L-33 blocks   Functional Limitation Carrying, moving  and handling objects   Carrying, Moving and Handling Objects Current Status [phone]) At least 40 percent but less than 60 percent impaired, limited or restricted   Carrying, Moving and Handling Objects Goal Status [id number]) At least 20 percent but less than 40 percent impaired, limited or restricted      Problem List Patient Active Problem List   Diagnosis Date Noted  . Trochanteric bursitis 03/03/2016  . Left hand pain 10/30/2015  . Fracture, finger, multiple sites 10/30/2015  . Colon cancer screening  07/23/2015  . Screening examination for infectious disease 07/23/2015  . Gout 02/18/2015  . Depression 01/06/2015  . Irritation of eyelid 08/20/2014  . Dupuytren's contracture 08/20/2014  . Cough 07/21/2014  . Lumbar stenosis with neurogenic claudication 06/13/2014  . Preop cardiovascular exam 05/28/2014  . S/P deep brain stimulator placement 05/08/2014  . PD (Parkinson's disease) ([place]) 05/08/2014  . Joint pain 01/22/2014  . Medicare annual wellness visit, initial 01/19/2014  . Advance care planning 01/19/2014  . Parkinson's disease ([place]) 12/13/2013  . PTSD (post-traumatic stress disorder) 06/13/2013  . Erectile dysfunction 06/13/2013  . HLD (hyperlipidemia) 06/13/2013  . Hip pain 06/10/2013  . Pain in joint, shoulder region 06/10/2013  . Right leg swelling 06/03/2013  . Essential hypertension 06/03/2013  . Bradycardia by electrocardiogram 06/03/2013  . Obstructive sleep apnea 03/12/2013  . Spinal stenosis, lumbar region, with neurogenic claudication 12/14/2012    [facility] 05/10/2016, 9:51 AM  [facility] [address] [place], [place], [address] Phone: [phone]   Fax:  [phone]  Name: [name] MRN: [id number] Date of Birth: May 24, 1950   Vianne Bulls, OTR/L [facility] [address] [place], [place]  [address] [phone] phone [phone] 05/10/16 9:51 AM

## 2016-05-10 NOTE — Patient Instructions (Signed)
Medication Instructions:   Please decrease the HCTZ to every other day IF BP continues to run low, Stop the HCTZ and take for ankle swelling  Labwork:  No new labs needed  Testing/Procedures:  No further testing at this time   I recommend watching educational videos on topics of interest to you at:       [email or web address]  Enter code: HEARTCARE    Follow-Up: It was a pleasure seeing you in the office today. Please call us if you have new issues that need to be addressed before your next appt.  [phone]  Your physician wants you to follow-up in: 6 months.  You will receive a reminder letter in the mail two months in advance. If you don't receive a letter, please call our office to schedule the follow-up appointment.  If you need a refill on your cardiac medications before your next appointment, please call your pharmacy.

## 2016-05-10 NOTE — Progress Notes (Signed)
Cardiology Office Note  Date:  05/10/2016   ID:  [name], DOB 09-Nov-1950, MRN [id number]  PCP:  George Stain, MD   Chief Complaint  Patient presents with  . other    OD f/u. Last ov 05/28/14. Pt states he is doing well. Reviewed meds with pt verbally.    HPI:  [name] is a pleasant 66 -year-old male year-old gentleman with Parkinson's for more than 10 years, hypertension, chronic bradycardia who presents for routine follow-up of his blood pressure and bradycardia.  Recent placement of deep brain stimulator August 2015 for tremor He receives most of his primary care followup at the [facility] Notes from there indicate history of hyperlipidemia, depression, Parkinson's, possible seizure disorder  Lab work reviewed with him in detail Glu 109 Total cho 150, LDL 90  Overall reports he is doing well Deep brain stimulator working well Scheduled to participate in physical therapy   history of chronic low back pain,  lumbar laminectomy 12/14/2012 by [name] in [place]  exercises during the week.  Parkinson's medicines were significantly decreased after brain stimulator was placed. Heart rate continues to run low, 40s, 50s. Blood pressure also running low at times, occasional lightheadedness when he stands up HCTZ previously used for lower extremity swelling, this has resolved sometime back  EKG on today's visit shows normal sinus rhythm with rate 53 bpm, artifact noted from deep brain stimulator. They did not have their device to turn the stimulator off for more accurate EKG on today's visit  Other past medical history reviewed CT scan of the head 05/29/2013 showing mild chronic inflammatory disease of the sinuses. This was done for right sided headache. Work done 05/29/2013 includes basic    PMH:   has a past medical history of Arthritis; Bradycardia; Cancer [facility]) (2013); Depression; Dysrhythmia; GERD (gastroesophageal reflux disease); Headache(784.0); History of  chicken pox; History of kidney stones; Hypertension; Parkinson's disease ([place]); PTSD (post-traumatic stress disorder); Shortness of breath dyspnea; Sleep apnea; and Varicose veins.  PSH:    Past Surgical History:  Procedure Laterality Date  . CHOLECYSTECTOMY N/A 10/22/2014   Procedure: LAPAROSCOPIC CHOLECYSTECTOMY WITH INTRAOPERATIVE CHOLANGIOGRAM;  Surgeon: [name] III, MD;  Location: [facility] ORS;  Service: General;  Laterality: N/A;  . cyst removed      from lip as a child  . LUMBAR LAMINECTOMY/DECOMPRESSION MICRODISCECTOMY Bilateral 12/14/2012   Procedure: Bilateral lumbar three-four, four-five decompressive laminotomy/foraminotomy;  Surgeon: George Pitter, MD;  Location: [place] NEURO ORS;  Service: Neurosurgery;  Laterality: Bilateral;  . PULSE GENERATOR IMPLANT Bilateral 12/13/2013   Procedure: Bilateral implantable pulse generator placement;  Surgeon: George Levine, MD;  Location: [place] NEURO ORS;  Service: Neurosurgery;  Laterality: Bilateral;  Bilateral implantable pulse generator placement  . skin cancer removed     from ears,   12 lft arm  rt leg 15  . SUBTHALAMIC STIMULATOR INSERTION Bilateral 12/06/2013   Procedure: SUBTHALAMIC STIMULATOR INSERTION;  Surgeon: George Levine, MD;  Location: [place] NEURO ORS;  Service: Neurosurgery;  Laterality: Bilateral;  Bilateral deep brain stimulator placement    Current Outpatient Prescriptions  Medication Sig Dispense Refill  . AMBULATORY NON FORMULARY MEDICATION 1 Device by Does not apply route daily. Lift Chair  Dx:g20 1 Device 0  . Artificial Tear Ointment (DRY EYES OP) Apply to eye.    Marland Kitchen buPROPion (WELLBUTRIN XL) 150 MG 24 hr tablet TAKE 3 TABLETS DAILY 270 tablet 1  . carbidopa-levodopa (SINEMET IR) 25-100 MG per tablet Take by mouth. 2 AM/ 1  afternoon/ 1 evening    . cholecalciferol (VITAMIN D) 1000 UNITS tablet Take 1,000 Units by mouth daily.    Marland Kitchen escitalopram (LEXAPRO) 10 MG tablet TAKE 1 TABLET DAILY 90 tablet 1  . naproxen (NAPROSYN) 500 MG tablet  Take 500 mg by mouth 2 (two) times daily with a meal.    . oxyCODONE-acetaminophen (PERCOCET/ROXICET) 5-325 MG tablet Take 1-2 tablets by mouth every 6 (six) hours as needed for severe pain (sedation caution). 40 tablet 0  . pravastatin (PRAVACHOL) 20 MG tablet TAKE 1 TABLET EVERY EVENING 90 tablet 3   No current facility-administered medications for this visit.      Allergies:   Klonopin [clonazepam] and Morphine and related   Social History:  The patient  reports that he has never smoked. He has quit using smokeless tobacco. He reports that he drinks alcohol. He reports that he does not use drugs.   Family History:   family history includes Lung cancer in his father.    Review of Systems: Review of Systems  Constitutional: Negative.   Respiratory: Negative.   Cardiovascular: Negative.   Gastrointestinal: Negative.   Musculoskeletal: Negative.   Neurological: Negative.   Psychiatric/Behavioral: Negative.   All other systems reviewed and are negative.    PHYSICAL EXAM: VS:  BP 110/62 (BP Location: Left Arm, Patient Position: Sitting, Cuff Size: Normal)   Pulse (!) 53  , BMI There is no height or weight on file to calculate BMI. GEN: Well nourished, well developed, in no acute distress  HEENT: normal  Neck: no JVD, carotid bruits, or masses Cardiac: RRR; no murmurs, rubs, or gallops,no edema  Respiratory:  clear to auscultation bilaterally, normal work of breathing GI: soft, nontender, nondistended, + BS MS: no deformity or atrophy  Skin: warm and dry, no rash Neuro:  Strength and sensation are intact Psych: euthymic mood, full affect    Recent Labs: 07/17/2015: ALT 15; BUN 19; Creatinine, Ser 1.10; Potassium 4.0; Sodium 141 07/21/2015: Hemoglobin 14.4; Platelets 203.0; TSH 0.56    Lipid Panel Lab Results  Component Value Date   CHOL 150 07/17/2015   HDL 33.10 (L) 07/17/2015   LDLCALC 90 07/17/2015   TRIG 136.0 07/17/2015      Wt Readings from Last 3 Encounters:   03/02/16 183 lb 12.8 oz (83.4 kg)  02/11/16 183 lb (83 kg)  02/04/16 183 lb 1.9 oz (83.1 kg)       ASSESSMENT AND PLAN:  Essential hypertension - Plan: EKG 12-Lead Blood pressure running low, we will stop HCTZ, make this a as needed medication for leg swelling Possibly having orthostasis symptoms  Mixed hyperlipidemia Cholesterol is at goal on the current lipid regimen. No changes to the medications were made.  Parkinson's disease ([place]) Reports his symptoms are Mcgee with deep brain stimulator Scheduled to participate in physical therapy  S/P deep brain stimulator placement Artifact on EKG today   Total encounter time more than 15 minutes  Greater than 50% was spent in counseling and coordination of care with the patient   Disposition:   F/U  12 months   Orders Placed This Encounter  Procedures  . EKG 12-Lead     Signed, Esmond Plants, M.D., Ph.D. 05/10/2016  [place], [place]

## 2016-05-11 ENCOUNTER — Ambulatory Visit: Payer: Medicare PPO

## 2016-05-11 ENCOUNTER — Ambulatory Visit: Payer: Medicare PPO | Admitting: Occupational Therapy

## 2016-05-11 DIAGNOSIS — R41844 Frontal lobe and executive function deficit: Secondary | ICD-10-CM | POA: Diagnosis not present

## 2016-05-11 DIAGNOSIS — R278 Other lack of coordination: Secondary | ICD-10-CM | POA: Diagnosis not present

## 2016-05-11 DIAGNOSIS — M6281 Muscle weakness (generalized): Secondary | ICD-10-CM

## 2016-05-11 DIAGNOSIS — R41841 Cognitive communication deficit: Secondary | ICD-10-CM

## 2016-05-11 DIAGNOSIS — R293 Abnormal posture: Secondary | ICD-10-CM

## 2016-05-11 DIAGNOSIS — R2681 Unsteadiness on feet: Secondary | ICD-10-CM | POA: Diagnosis not present

## 2016-05-11 DIAGNOSIS — R29898 Other symptoms and signs involving the musculoskeletal system: Secondary | ICD-10-CM | POA: Diagnosis not present

## 2016-05-11 DIAGNOSIS — R29818 Other symptoms and signs involving the nervous system: Secondary | ICD-10-CM

## 2016-05-11 DIAGNOSIS — R471 Dysarthria and anarthria: Secondary | ICD-10-CM | POA: Diagnosis not present

## 2016-05-11 DIAGNOSIS — R2689 Other abnormalities of gait and mobility: Secondary | ICD-10-CM | POA: Diagnosis not present

## 2016-05-11 NOTE — Therapy (Signed)
[facility] [address] [place], [place], [address] Phone: [phone]   Fax:  [phone]  Occupational Therapy Treatment  Patient Details  Name: [name] MRN: [id number] Date of Birth: [birth year] Referring Provider: Dr. Wells Guiles Tat  Encounter Date: 05/11/2016    Past Medical History:  Diagnosis Date  . Arthritis   . Bradycardia   . Cancer [facility]) 2013   skin cancer  . Depression    ptsd  . Dysrhythmia    chronic slow heart rate  . GERD (gastroesophageal reflux disease)   . Headache(784.0)    tension headaches non recent  . History of chicken pox   . History of kidney stones   . Hypertension    treated with HCTZ  . Parkinson's disease ([place])    dx'ed 15 years ago  . PTSD (post-traumatic stress disorder)   . Shortness of breath dyspnea   . Sleep apnea    doesn't use C-pap  . Varicose veins     Past Surgical History:  Procedure Laterality Date  . CHOLECYSTECTOMY N/A 10/22/2014   Procedure: LAPAROSCOPIC CHOLECYSTECTOMY WITH INTRAOPERATIVE CHOLANGIOGRAM;  Surgeon: Dia Crawford III, MD;  Location: [facility] ORS;  Service: General;  Laterality: N/A;  . cyst removed      from lip as a child  . LUMBAR LAMINECTOMY/DECOMPRESSION MICRODISCECTOMY Bilateral 12/14/2012   Procedure: Bilateral lumbar three-four, four-five decompressive laminotomy/foraminotomy;  Surgeon: Charlie Pitter, MD;  Location: [place] [facility] ORS;  Service: Neurosurgery;  Laterality: Bilateral;  . PULSE GENERATOR IMPLANT Bilateral 12/13/2013   Procedure: Bilateral implantable pulse generator placement;  Surgeon: Erline Levine, MD;  Location: [place] [facility] ORS;  Service: Neurosurgery;  Laterality: Bilateral;  Bilateral implantable pulse generator placement  . skin cancer removed     from ears,   12 lft arm  rt leg 15  . SUBTHALAMIC STIMULATOR INSERTION Bilateral 12/06/2013   Procedure: SUBTHALAMIC STIMULATOR INSERTION;  Surgeon: Erline Levine, MD;  Location: [place] [facility]  ORS;  Service: Neurosurgery;  Laterality: Bilateral;  Bilateral deep brain stimulator placement    There were no vitals filed for this visit.      Subjective Assessment - 05/11/16 1107    Subjective  pain in hip   Pertinent History Parkinson's diagnosis approx 17 yrs ago, hx of 3 back surgeries (last approx 2 yrs ago), bilateral DBS placement approx 3 yrs ago, multiple falls, R 3rd digit fx 10/2015, HTN, bradycardia, PTSD, lumbar stenosis, HDL, Dupuytren contracture, depression, gout    Limitations Bilateral Deep Brain Stimulator; Fall risk   Patient Stated Goals safety per wife   Currently in Pain? Yes   Pain Score 3    Pain Location Hip   Pain Orientation Right   Pain Descriptors / Indicators Burning   Pain Type Chronic pain   Pain Onset More than a month ago   Pain Frequency Constant   Aggravating Factors  movement   Pain Relieving Factors rest                         PWR [facility]) - 05/11/16 1312    PWR! exercises Moves in supine   PWR! Up x20   PWR! Rock [employer]! Twist x20   Comments mod v.c. for positioning and large amplitude movements, pt was encouraged to rotate legs with arms due to history of back surgery             OT Education - 05/11/16 1459  Education provided Yes   Education Details PWR! supine for PWr! up , rock and twist, beginning coordination HEP, fastening buttons with big movements.   Person(s) Educated Patient;Spouse   Methods Explanation;Demonstration;Verbal cues;Handout   Comprehension Verbalized understanding;Returned demonstration;Verbal cues required          OT Short Term Goals - 05/10/16 0939      OT SHORT TERM GOAL #1   Title Pt/caregiver will be independent with PD-specific HEP.--check STGs 06/09/16   Time 4   Period Weeks   Status New     OT SHORT TERM GOAL #2   Title Pt will improve coordination for ADLs as shown by improving time on 9-hole peg test by at least 5sec with dominant RUE.   Baseline 60.56sec    Time 4   Period Weeks   Status New     OT SHORT TERM GOAL #3   Title Pt will improve coordination/functional reaching for ADLs as shown by improving box and blocks score by at least 4 bilaterally.   Baseline R-29, L-33 blocks   Time 4   Period Weeks   Status New     OT SHORT TERM GOAL #4   Title Pt will incr ease with eating as shown by completing PPT#2 in 15sec or less.   Baseline 17.53sec   Time 4   Period Weeks   Status New     OT SHORT TERM GOAL #5   Title Pt/caregiver will verbalize ways to prevent future complications and appropriate community resources prn.   Time 4   Period Weeks   Status New           OT Long Term Goals - 05/10/16 0944      OT LONG TERM GOAL #1   Title Pt/caregiver will verbalize understanding of adaptive strategies/equipment to incr ease/safety/independence with ADLs/IADLs.--check LTGs 07/07/16   Baseline -----   Time 8   Period Weeks   Status New     OT LONG TERM GOAL #2   Title Pt will improve coordination for ADLs as shown by improving time on 9-hole peg test by at least 10sec with dominant RUE.   Baseline 60.56sec   Time 8   Period Weeks   Status New     OT LONG TERM GOAL #3   Title Pt will improve coordination/functional reaching for ADLs as shown by improving box and blocks score by at least 8 with RUE.   Baseline R-29 blocks   Time 8   Period Weeks   Status New     OT LONG TERM GOAL #4   Title Pt will fasten/unfasten 3 buttons in less than 45sec for incr ease with dressing.   Baseline 53.0sec   Time 8   Period Weeks   Status New     OT LONG TERM GOAL #5   Title Pt will improve balance for ADLs/IADLs as shown by improving standing functional reach test by at least 2 inches with LUE.   Baseline R-8", L-6"   Time 8   Period Weeks   Status New     OT LONG TERM GOAL #6   Title ---------   Baseline -------     OT LONG TERM GOAL #7   Title -----   Baseline ------               Plan - 05/11/16 1314     Clinical Impression Statement Pt is progressing towards goals. He can benefit from review/ repetiion of PWR! moves in supine.  Rehab Potential Good   Clinical Impairments Affecting Rehab Potential cognitive deficits, severity of deficits, hip pain   OT Frequency 2x / week   OT Duration 8 weeks   OT Treatment/Interventions Self-care/ADL training;Moist Heat;Cryotherapy;Therapeutic exercise;Parrafin;DME and/or AE instruction;Therapist, nutritional;Therapeutic activities;Patient/family education;Balance training;Cognitive remediation/compensation;Splinting;Manual Therapy;Neuromuscular education;Ultrasound;Fluidtherapy;Energy conservation;Passive range of motion;Therapeutic exercises   Plan review PWR! supine, consider PWR! seated, add to coordination HEP   [place]! supine, basic coordination(cards only)      Patient will benefit from skilled therapeutic intervention in order to improve the following deficits and impairments:  Decreased mobility, Decreased balance, Abnormal gait, Pain, Impaired tone, Impaired perceived functional ability, Decreased knowledge of precautions, Decreased coordination, Decreased activity tolerance, Decreased cognition, Decreased knowledge of use of DME, Decreased safety awareness, Decreased strength, Impaired UE functional use, Improper spinal/pelvic alignment, Improper body mechanics  Visit Diagnosis: Other symptoms and signs involving the musculoskeletal system  Abnormal posture  Other lack of coordination  Frontal lobe and executive function deficit  Muscle weakness (generalized)  Other symptoms and signs involving the nervous system    Problem List Patient Active Problem List   Diagnosis Date Noted  . Trochanteric bursitis 03/03/2016  . Left hand pain 10/30/2015  . Fracture, finger, multiple sites 10/30/2015  . Colon cancer screening 07/23/2015  . Screening examination for infectious disease 07/23/2015  . Gout 02/18/2015  .  Depression 01/06/2015  . Irritation of eyelid 08/20/2014  . Dupuytren's contracture 08/20/2014  . Cough 07/21/2014  . Lumbar stenosis with neurogenic claudication 06/13/2014  . Preop cardiovascular exam 05/28/2014  . S/P deep brain stimulator placement 05/08/2014  . PD (Parkinson's disease) ([place]) 05/08/2014  . Joint pain 01/22/2014  . Medicare annual wellness visit, initial 01/19/2014  . Advance care planning 01/19/2014  . Parkinson's disease ([place]) 12/13/2013  . PTSD (post-traumatic stress disorder) 06/13/2013  . Erectile dysfunction 06/13/2013  . HLD (hyperlipidemia) 06/13/2013  . Hip pain 06/10/2013  . Pain in joint, shoulder region 06/10/2013  . Right leg swelling 06/03/2013  . Essential hypertension 06/03/2013  . Bradycardia by electrocardiogram 06/03/2013  . Obstructive sleep apnea 03/12/2013  . Spinal stenosis, lumbar region, with neurogenic claudication 12/14/2012    Taneika Choi 05/11/2016, 3:01 PM Theone Murdoch, OTR/L [phone] Phone: [phone] 3:01 PM 05/11/16 [facility] [address] [place], [place], [address] Phone: [phone]   Fax:  [phone]  Name: [name] MRN: [id number] Date of Birth: November 11, 1950

## 2016-05-11 NOTE — Patient Instructions (Signed)
Coordination Exercises  Perform the following exercises for 10-20 minutes 1 times per day. Perform with both hand(s). Perform using big movements.   Flipping Cards: Place deck of cards on the table. Flip cards over by opening your hand big to grasp and then turn your palm up big.  Deal cards: Hold 1/2 or whole deck in your hand. Use thumb to push card off top of deck with one big push.  Perform "Flicks"/hand stretches (PWR! Hands): Close hands then flick out your fingers with focus on opening hands, pulling wrists back, and extending elbows like you are pushing.

## 2016-05-11 NOTE — Patient Instructions (Signed)
   You can talk clearer if you talk louder and open your mouth more. TALK BIG, TALK LOUD   WHEN YOU EAT:  TAKE SMALL bites and SMALL sips  SWALLOW  HARD!  Put the cup down between sips   Put the spoon down between bites

## 2016-05-12 NOTE — Therapy (Addendum)
[facility] [address] [place], [place], [address] Phone: [phone]   Fax:  [phone]  Speech Language Pathology Evaluation  Patient Details  Name: [name] MRN: [id number] Date of Birth: 04-May-1950 Referring Provider: Alonza Bogus, DO  Encounter Date: 05/11/2016      End of Session - 05/12/16 1238    Visit Number 1   Number of Visits 17   Date for SLP Re-Evaluation 07/15/16   SLP Start Time 25   SLP Stop Time  1400   SLP Time Calculation (min) 42 min   Activity Tolerance Patient tolerated treatment well      Past Medical History:  Diagnosis Date  . Arthritis   . Bradycardia   . Cancer [facility]) 2013   skin cancer  . Depression    ptsd  . Dysrhythmia    chronic slow heart rate  . GERD (gastroesophageal reflux disease)   . Headache(784.0)    tension headaches non recent  . History of chicken pox   . History of kidney stones   . Hypertension    treated with HCTZ  . Parkinson's disease ([place])    dx'ed 15 years ago  . PTSD (post-traumatic stress disorder)   . Shortness of breath dyspnea   . Sleep apnea    doesn't use C-pap  . Varicose veins     Past Surgical History:  Procedure Laterality Date  . CHOLECYSTECTOMY N/A 10/22/2014   Procedure: LAPAROSCOPIC CHOLECYSTECTOMY WITH INTRAOPERATIVE CHOLANGIOGRAM;  Surgeon: Dia Crawford III, MD;  Location: [facility] ORS;  Service: General;  Laterality: N/A;  . cyst removed      from lip as a child  . LUMBAR LAMINECTOMY/DECOMPRESSION MICRODISCECTOMY Bilateral 12/14/2012   Procedure: Bilateral lumbar three-four, four-five decompressive laminotomy/foraminotomy;  Surgeon: Charlie Pitter, MD;  Location: [place] [facility] ORS;  Service: Neurosurgery;  Laterality: Bilateral;  . PULSE GENERATOR IMPLANT Bilateral 12/13/2013   Procedure: Bilateral implantable pulse generator placement;  Surgeon: Erline Levine, MD;  Location: [facility] [facility] ORS;  Service: Neurosurgery;  Laterality: Bilateral;   Bilateral implantable pulse generator placement  . skin cancer removed     from ears,   12 lft arm  rt leg 15  . SUBTHALAMIC STIMULATOR INSERTION Bilateral 12/06/2013   Procedure: SUBTHALAMIC STIMULATOR INSERTION;  Surgeon: Erline Levine, MD;  Location: [facility] ORS;  Service: Neurosurgery;  Laterality: Bilateral;  Bilateral deep brain stimulator placement    There were no vitals filed for this visit.      Subjective Assessment - 05/11/16 1317    Subjective "I was here yesterday." (Pt was here two days ago for PT/OT evals)   Patient is accompained by: Family member  wife   Currently in Pain? Yes   Pain Score 3    Pain Location Hip   Pain Orientation Right   Pain Descriptors / Indicators Burning   Pain Type Chronic pain   Pain Onset More than a month ago   Pain Frequency Constant   Aggravating Factors  moving   Pain Relieving Factors rest            SLP Evaluation [facility] - 05/12/16 0001      SLP Visit Information   SLP Received On 05/11/16   Referring Provider Tat, Wells Guiles, DO   Onset Date > 10 years ago   Medical Diagnosis Parkinson's Disease     Subjective   Subjective Pt placed with DBS in fall 2015.      Pain Assessment   Pain Onset  More than a month ago     General Information   HPI 66 y.o. male diagnosed with Parkinson's disease diagnosed approx 10 years ago, DBS placed fall 2015, and pt reports that about a year ago people asked him to repeat himself more often.      Prior Functional Status   Cognitive/Linguistic Baseline Baseline deficits   Baseline deficit details awareness, memory, attention    Lives With Spouse     Cognition   Overall Cognitive Status Impaired/Different from baseline   Area of Impairment Memory;Safety/judgement;Awareness   Memory Decreased short-term memory;Decreased recall of precautions   Memory Comments Pt looked to wife to tell history. Pt continues to talk away from his wife, when he is asked to face her multiple times per week to  improve intelligibility. Short term memory deficits- Pt: "Why didn't I hear about that?" Wife: "You were there when she said it."   Safety/Judgement Decreased awareness of safety   Awareness Intellectual;Emergent   Awareness Comments "She's in the habit of asking me to repeat myself." Pt not folloiwng swallow precautions from April 2017.     Auditory Comprehension   Overall Auditory Comprehension Appears within functional limits for tasks assessed     Verbal Expression   Overall Verbal Expression Appears within functional limits for tasks assessed     Oral Motor/Sensory Function   Overall Oral Motor/Sensory Function Impaired   Labial ROM Reduced right;Reduced left   Labial Strength Reduced   Labial Coordination Reduced   Lingual ROM Reduced right;Reduced left   Lingual Strength Reduced   Lingual Coordination Reduced   Velum Within Functional Limits     Motor Speech   Overall Motor Speech Impaired at baseline   Respiration Impaired   Level of Impairment Sentence   Articulation Impaired   Level of Impairment Phrase   Intelligibility Intelligibility reduced   Word 75-100% accurate  95% - worse with multisyllable words   Phrase 75-100% accurate  longer phrases more difficult to understand   Sentence 75-100% accurate  reading- 80%   Conversation 75-100% accurate  overall 85-90% intelligible   Motor Planning Impaired   Level of Impairment Phrase   Motor Speech Errors Consistent   Effective Techniques Increased vocal intensity;Over-articulate     "He didn't like speech before," pt's wife stated. Pt had undergone LSVT LOUD at another facility approx 1-2 years ago. He has not maintained any of the recommended practice after d/c, per wife.  Pt may require swallowing assessment during this course of therapy. [name] reports pt coughs frequently with meals. Pt denies this.  SLP reviewed pt's swallow precautions from previous modified (MBSS) with pt and wife and provided handout  for pt. SLP also educated pt re: speech compensations ("Talk loud. Talk big."). (see SLP education", below)                     SLP Education - 05/11/16 1406    Education provided Yes   Education Details swallow precautions from modified barium swallow April 2017, speech compensatory strategies   Person(s) Educated Patient;Spouse   Methods Explanation;Demonstration;Verbal cues;Handout   Comprehension Need further instruction;Verbal cues required;Verbalized understanding          SLP Short Term Goals - 05/12/16 1242      SLP SHORT TERM GOAL #1   Title pt will maintain speech volume appropriate for 95-100% intelligibilty in 7 minutes simple conversation with rare min A   Time 4   Period Weeks   Status New  SLP SHORT TERM GOAL #2   Title pt will demo emergent awareness re: reduced speech loudness in conversation, by repeating hjimself spontaneously 70% of opportunities   Time 4   Period Weeks   Status New     SLP SHORT TERM GOAL #3   Title pt will demo knowledge of memory compensations during 3 therapy sessions   Time 4   Period Weeks   Status New          SLP Long Term Goals - 05/12/16 1245      SLP LONG TERM GOAL #1   Title pt will maintain speech volume adequate to achieve at least 95% intelligibility in a noisy environment over 10 minutes of simple to mod complex conversation over three sessions   Time 8   Period Weeks   Status New     SLP LONG TERM GOAL #2   Title pt will demo emergent awareness of low speech volume in 10 minutes conversation by self-repeating in 75% of opportunities   Time 8   Period Weeks   Status New          Plan - 05/11/16 1639    Clinical Impression Statement Pt presents today with mod dysarthria and mod cognitive deficits caused as a result of Parkinson's disease. Pt with deficits in memory which could hinder overall progress, as well as pt's wife stating pt did not enjoy previous ST at another facility. Pt would  benefit from skilled ST addressing incr'd speech volume as well as improved cognitive linguistic skills/compensatory skills in order to maximize communicative effectiveness at home and in community.   Speech Therapy Frequency 2x / week   Duration --  8 weeks   Treatment/Interventions Compensatory techniques;Cognitive reorganization;SLP instruction and feedback;Internal/external aids;Functional tasks;Patient/family education;Cueing hierarchy   Potential to Achieve Goals Good   Potential Considerations Ability to learn/carryover information;Cooperation/participation level;Severity of impairments   Consulted and Agree with Plan of Care Patient;Family member/caregiver   Family Member Consulted wife      Patient will benefit from skilled therapeutic intervention in order to improve the following deficits and impairments:   Cognitive communication deficit  Dysarthria and anarthria  SLP G-CODE MOTOR SPEECH CURRENT - [id number]) - CK (40-60% impaired) MOTOR SPEECH GOAL (G9186) - CK (40-60% impaired)  Problem List Patient Active Problem List   Diagnosis Date Noted  . Trochanteric bursitis 03/03/2016  . Left hand pain 10/30/2015  . Fracture, finger, multiple sites 10/30/2015  . Colon cancer screening 07/23/2015  . Screening examination for infectious disease 07/23/2015  . Gout 02/18/2015  . Depression 01/06/2015  . Irritation of eyelid 08/20/2014  . Dupuytren's contracture 08/20/2014  . Cough 07/21/2014  . Lumbar stenosis with neurogenic claudication 06/13/2014  . Preop cardiovascular exam 05/28/2014  . S/P deep brain stimulator placement 05/08/2014  . PD (Parkinson's disease) ([place]) 05/08/2014  . Joint pain 01/22/2014  . Medicare annual wellness visit, initial 01/19/2014  . Advance care planning 01/19/2014  . Parkinson's disease ([place]) 12/13/2013  . PTSD (post-traumatic stress disorder) 06/13/2013  . Erectile dysfunction 06/13/2013  . HLD (hyperlipidemia) 06/13/2013  . Hip pain  06/10/2013  . Pain in joint, shoulder region 06/10/2013  . Right leg swelling 06/03/2013  . Essential hypertension 06/03/2013  . Bradycardia by electrocardiogram 06/03/2013  . Obstructive sleep apnea 03/12/2013  . Spinal stenosis, lumbar region, with neurogenic claudication 12/14/2012    [facility] ,MS, [place]  05/12/2016, 12:49 PM  [facility] [address] [place], [place], [address] Phone:  [phone]   Fax:  [phone]  Name: JANEK GARDEN MRN: [id number] Date of Birth: 1951/03/14

## 2016-05-17 ENCOUNTER — Encounter: Payer: Self-pay | Admitting: Family Medicine

## 2016-05-18 ENCOUNTER — Ambulatory Visit: Payer: Self-pay | Admitting: Physical Therapy

## 2016-05-18 ENCOUNTER — Ambulatory Visit: Payer: Medicare PPO | Admitting: Physical Therapy

## 2016-05-18 ENCOUNTER — Encounter: Payer: Medicare PPO | Admitting: Occupational Therapy

## 2016-05-18 ENCOUNTER — Ambulatory Visit: Payer: Medicare PPO

## 2016-05-18 ENCOUNTER — Encounter: Payer: Self-pay | Admitting: Occupational Therapy

## 2016-05-19 ENCOUNTER — Ambulatory Visit: Payer: Medicare PPO | Admitting: Occupational Therapy

## 2016-05-19 ENCOUNTER — Ambulatory Visit: Payer: Medicare PPO

## 2016-05-19 ENCOUNTER — Ambulatory Visit: Payer: Medicare PPO | Attending: Neurology | Admitting: Physical Therapy

## 2016-05-19 DIAGNOSIS — M6281 Muscle weakness (generalized): Secondary | ICD-10-CM | POA: Insufficient documentation

## 2016-05-19 DIAGNOSIS — R2689 Other abnormalities of gait and mobility: Secondary | ICD-10-CM | POA: Insufficient documentation

## 2016-05-19 DIAGNOSIS — R293 Abnormal posture: Secondary | ICD-10-CM

## 2016-05-19 DIAGNOSIS — R278 Other lack of coordination: Secondary | ICD-10-CM | POA: Insufficient documentation

## 2016-05-19 DIAGNOSIS — R41844 Frontal lobe and executive function deficit: Secondary | ICD-10-CM | POA: Diagnosis not present

## 2016-05-19 DIAGNOSIS — R2681 Unsteadiness on feet: Secondary | ICD-10-CM | POA: Insufficient documentation

## 2016-05-19 DIAGNOSIS — R471 Dysarthria and anarthria: Secondary | ICD-10-CM | POA: Diagnosis not present

## 2016-05-19 DIAGNOSIS — R29898 Other symptoms and signs involving the musculoskeletal system: Secondary | ICD-10-CM | POA: Insufficient documentation

## 2016-05-19 DIAGNOSIS — R29818 Other symptoms and signs involving the nervous system: Secondary | ICD-10-CM | POA: Insufficient documentation

## 2016-05-19 DIAGNOSIS — R1312 Dysphagia, oropharyngeal phase: Secondary | ICD-10-CM | POA: Insufficient documentation

## 2016-05-19 DIAGNOSIS — R41841 Cognitive communication deficit: Secondary | ICD-10-CM | POA: Insufficient documentation

## 2016-05-19 NOTE — Patient Instructions (Addendum)
Coordination Exercises  Perform the following exercises for 10-20 minutes 1 times per day. Perform with both hand(s). Perform using big movements.   Flipping Cards: Place deck of cards on the table. Flip cards over by opening your hand big to grasp and then turn your palm up big.  Deal cards: Hold 1/2 or whole deck in your hand. Use thumb to push card off top of deck with one big push.  Perform "Flicks"/hand stretches (PWR! Hands): Close hands then flick out your fingers with focus on opening hands, pulling wrists back, and extending elbows like you are pushing.  Rotate ball with fingertips: Pick up with fingers/thumb and move as much as you can with each turn/movement (clockwise and counter-clockwise).  Pick up coins and place in coin bank or container: Pick up with big, intentional movements. Do not drag coin to the edge.  Pick up coins and stack one at a time: Open hand fully, then Pick up with big, intentional movements. Do not drag coin to the edge. (5-10 in a stack)  Pick up 5-10 coins one at a time and hold in palm. Then, move coins from palm to fingertips one  at time and place in coin bank/container.

## 2016-05-19 NOTE — Therapy (Signed)
[facility] [address] [place], [place], [address] Phone: [phone]   Fax:  [phone]  Occupational Therapy Treatment  Patient Details  Name: [name] MRN: [id number] Date of Birth: [birth year] Referring Provider: Dr. Wells Guiles Tat  Encounter Date: 05/19/2016      OT End of Session - 05/19/16 0946    Visit Number 3   Number of Visits 17   Date for OT Re-Evaluation 07/07/16   Authorization Type [employer] Medicare / [employer], no auth/visit limit, G-code needed   Authorization - Visit Number 3   Authorization - Number of Visits 10   OT Start Time [phone]   OT Stop Time 1016   OT Time Calculation (min) 39 min   Activity Tolerance Patient tolerated treatment well   Behavior During Therapy [facility] for tasks assessed/performed      Past Medical History:  Diagnosis Date  . Arthritis   . Bradycardia   . Cancer [facility]) 2013   skin cancer  . Depression    ptsd  . Dysrhythmia    chronic slow heart rate  . GERD (gastroesophageal reflux disease)   . Headache(784.0)    tension headaches non recent  . History of chicken pox   . History of kidney stones   . Hypertension    treated with HCTZ  . Parkinson's disease ([place])    dx'ed 15 years ago  . PTSD (post-traumatic stress disorder)   . Shortness of breath dyspnea   . Sleep apnea    doesn't use C-pap  . Varicose veins     Past Surgical History:  Procedure Laterality Date  . CHOLECYSTECTOMY N/A 10/22/2014   Procedure: LAPAROSCOPIC CHOLECYSTECTOMY WITH INTRAOPERATIVE CHOLANGIOGRAM;  Surgeon: Dia Crawford III, MD;  Location: [facility] ORS;  Service: General;  Laterality: N/A;  . cyst removed      from lip as a child  . LUMBAR LAMINECTOMY/DECOMPRESSION MICRODISCECTOMY Bilateral 12/14/2012   Procedure: Bilateral lumbar three-four, four-five decompressive laminotomy/foraminotomy;  Surgeon: Charlie Pitter, MD;  Location: [place] NEURO ORS;  Service: Neurosurgery;  Laterality: Bilateral;  .  PULSE GENERATOR IMPLANT Bilateral 12/13/2013   Procedure: Bilateral implantable pulse generator placement;  Surgeon: Erline Levine, MD;  Location: [place] NEURO ORS;  Service: Neurosurgery;  Laterality: Bilateral;  Bilateral implantable pulse generator placement  . skin cancer removed     from ears,   12 lft arm  rt leg 15  . SUBTHALAMIC STIMULATOR INSERTION Bilateral 12/06/2013   Procedure: SUBTHALAMIC STIMULATOR INSERTION;  Surgeon: Erline Levine, MD;  Location: [place] NEURO ORS;  Service: Neurosurgery;  Laterality: Bilateral;  Bilateral deep brain stimulator placement    There were no vitals filed for this visit.      Subjective Assessment - 05/19/16 0936    Subjective  Pt reports 5-6 falls since last visit, mostly with sit>stand.   Pertinent History Parkinson's diagnosis approx 17 yrs ago, hx of 3 back surgeries (last approx 2 yrs ago), bilateral DBS placement approx 3 yrs ago, multiple falls, R 3rd digit fx 10/2015, HTN, bradycardia, PTSD, lumbar stenosis, HDL, Dupuytren contracture, depression, gout    Limitations Bilateral Deep Brain Stimulator; Fall risk   Patient Stated Goals safety per wife   Currently in Pain? No/denies         PWR! Up in sitting with focus on control and in prep for standing.  Pt demo improved sit>stand without trunk hyperext today after PWR! Up ex.  OT Education - 05/19/16 0945    Education Details Reviewed coordination HEP and added ex (see pt instructions); PWR! up in sitting   Person(s) Educated Patient   Methods Explanation;Handout   Comprehension Verbalized understanding;Returned demonstration;Verbal cues required  mod cueing for large amplitude, controlled movement for PWR! sitting          OT Short Term Goals - 05/10/16 0939      OT SHORT TERM GOAL #1   Title Pt/caregiver will be independent with PD-specific HEP.--check STGs 06/09/16   Time 4   Period Weeks   Status New     OT SHORT TERM GOAL #2   Title Pt will  improve coordination for ADLs as shown by improving time on 9-hole peg test by at least 5sec with dominant RUE.   Baseline 60.56sec   Time 4   Period Weeks   Status New     OT SHORT TERM GOAL #3   Title Pt will improve coordination/functional reaching for ADLs as shown by improving box and blocks score by at least 4 bilaterally.   Baseline R-29, L-33 blocks   Time 4   Period Weeks   Status New     OT SHORT TERM GOAL #4   Title Pt will incr ease with eating as shown by completing PPT#2 in 15sec or less.   Baseline 17.53sec   Time 4   Period Weeks   Status New     OT SHORT TERM GOAL #5   Title Pt/caregiver will verbalize ways to prevent future complications and appropriate community resources prn.   Time 4   Period Weeks   Status New           OT Long Term Goals - 05/10/16 0944      OT LONG TERM GOAL #1   Title Pt/caregiver will verbalize understanding of adaptive strategies/equipment to incr ease/safety/independence with ADLs/IADLs.--check LTGs 07/07/16   Baseline -----   Time 8   Period Weeks   Status New     OT LONG TERM GOAL #2   Title Pt will improve coordination for ADLs as shown by improving time on 9-hole peg test by at least 10sec with dominant RUE.   Baseline 60.56sec   Time 8   Period Weeks   Status New     OT LONG TERM GOAL #3   Title Pt will improve coordination/functional reaching for ADLs as shown by improving box and blocks score by at least 8 with RUE.   Baseline R-29 blocks   Time 8   Period Weeks   Status New     OT LONG TERM GOAL #4   Title Pt will fasten/unfasten 3 buttons in less than 45sec for incr ease with dressing.   Baseline 53.0sec   Time 8   Period Weeks   Status New     OT LONG TERM GOAL #5   Title Pt will improve balance for ADLs/IADLs as shown by improving standing functional reach test by at least 2 inches with LUE.   Baseline R-8", L-6"   Time 8   Period Weeks   Status New     OT LONG TERM GOAL #6   Title ---------    Baseline -------     OT LONG TERM GOAL #7   Title -----   Baseline ------               Plan - 05/19/16 [id number]    Clinical Impression Statement Pt is progressing slowly towards goals.  Cognitive deficits affect participation (pt easily distracted and needed mod cueing for large amplitude movements).  Pt will benefit from repetition.  Pt demo improved sit>stand without LOB at end of session.   Rehab Potential Good   Clinical Impairments Affecting Rehab Potential cognitive deficits, severity of deficits, hip pain   OT Frequency 2x / week   OT Duration 8 weeks   OT Treatment/Interventions Self-care/ADL training;Moist Heat;Cryotherapy;Therapeutic exercise;Parrafin;DME and/or AE instruction;Therapist, nutritional;Therapeutic activities;Patient/family education;Balance training;Cognitive remediation/compensation;Splinting;Manual Therapy;Neuromuscular education;Ultrasound;Fluidtherapy;Energy conservation;Passive range of motion;Therapeutic exercises   Plan review supine PWR! (deliberate movements), review PWR! up in sitting for forward flex in prep for standing and avoiding hyperext (work on control); strategies for ADLs (controlled large amplitude movements and safety due to fall risk)   OT Home Exercise Plan PWR! supine, basic coordination(cards only)   Consulted and Agree with Plan of Care Patient      Patient will benefit from skilled therapeutic intervention in order to improve the following deficits and impairments:  Decreased mobility, Decreased balance, Abnormal gait, Pain, Impaired tone, Impaired perceived functional ability, Decreased knowledge of precautions, Decreased coordination, Decreased activity tolerance, Decreased cognition, Decreased knowledge of use of DME, Decreased safety awareness, Decreased strength, Impaired UE functional use, Improper spinal/pelvic alignment, Improper body mechanics  Visit Diagnosis: Other symptoms and signs involving the nervous system  Other  symptoms and signs involving the musculoskeletal system  Abnormal posture  Other lack of coordination  Frontal lobe and executive function deficit  Muscle weakness (generalized)  Other abnormalities of gait and mobility  Unsteadiness on feet    Problem List Patient Active Problem List   Diagnosis Date Noted  . Trochanteric bursitis 03/03/2016  . Left hand pain 10/30/2015  . Fracture, finger, multiple sites 10/30/2015  . Colon cancer screening 07/23/2015  . Screening examination for infectious disease 07/23/2015  . Gout 02/18/2015  . Depression 01/06/2015  . Irritation of eyelid 08/20/2014  . Dupuytren's contracture 08/20/2014  . Cough 07/21/2014  . Lumbar stenosis with neurogenic claudication 06/13/2014  . Preop cardiovascular exam 05/28/2014  . S/P deep brain stimulator placement 05/08/2014  . PD (Parkinson's disease) ([place]) 05/08/2014  . Joint pain 01/22/2014  . Medicare annual wellness visit, initial 01/19/2014  . Advance care planning 01/19/2014  . Parkinson's disease ([place]) 12/13/2013  . PTSD (post-traumatic stress disorder) 06/13/2013  . Erectile dysfunction 06/13/2013  . HLD (hyperlipidemia) 06/13/2013  . Hip pain 06/10/2013  . Pain in joint, shoulder region 06/10/2013  . Right leg swelling 06/03/2013  . Essential hypertension 06/03/2013  . Bradycardia by electrocardiogram 06/03/2013  . Obstructive sleep apnea 03/12/2013  . Spinal stenosis, lumbar region, with neurogenic claudication 12/14/2012    [facility] 05/19/2016, 4:10 PM  [place] [address] [place], [place], [address] Phone: [phone]   Fax:  [phone]  Name: [name] MRN: [id number] Date of Birth: 08-23-1950   Vianne Bulls, OTR/L [facility] [address] [place],   [address] [phone] phone [phone] 05/19/16 4:10 PM

## 2016-05-19 NOTE — Patient Instructions (Signed)
SWALLOWING EXERCISES Do these 6 of the 7 days per week  1. Effortful Swallows - Press your tongue against the roof of your mouth for 3 seconds, then squeeze          the muscles in your neck while you swallow your saliva or a sip of water - Repeat 20 times, 2 times a day, and use whenever you eat or drink  2. Masako Swallow - swallow with your tongue sticking out - Stick tongue out past your teeth and gently bite tongue with your teeth - Swallow, while holding your tongue with your teeth - Repeat 20 times, 2 times a day *use a wet spoon if your mouth gets dry*  3. Shaker Exercise - head lift - Lie flat on your back in your bed or on a couch without pillows - Raise your head and look at your feet - KEEP YOUR SHOULDERS DOWN - HOLD FOR 45-60 SECONDS, then lower your head back down - Repeat 3 times, 2 times a day  4. Mendelsohn Maneuver - "half swallow" exercise - Start to swallow, and keep your Adam's apple up by squeezing hard with the            muscles of the throat - Hold the squeeze for 5-7 seconds and then relax - Repeat 20 times, 2 times a day *use a wet spoon if your mouth gets dry*  5. Breath Hold - Say "HUH!" loudly, then hold your breath for 3 seconds at your voice box - Repeat 20 times, 2 times a day  6. Chin pushback - Open your mouth  - Place your fist UNDER your chin near your neck, and push back with your fist for 5 seconds - Repeat 10 times, 2 times a day

## 2016-05-19 NOTE — Therapy (Signed)
[facility] [address] [place], [place], [address] Phone: [phone]   Fax:  [phone]  Speech Language Pathology Treatment  Patient Details  Name: [name] MRN: [id number] Date of Birth: 06-01-1950 Referring Provider: Alonza Bogus, DO  Encounter Date: 05/19/2016      End of Session - 05/19/16 0901    Visit Number 2   Number of Visits 17   Date for SLP Re-Evaluation 07/22/16   SLP Start Time 0808  pt 5 minutes late   SLP Stop Time  0846   SLP Time Calculation (min) 38 min   Activity Tolerance Patient tolerated treatment well      Past Medical History:  Diagnosis Date  . Arthritis   . Bradycardia   . Cancer [facility]) 2013   skin cancer  . Depression    ptsd  . Dysrhythmia    chronic slow heart rate  . GERD (gastroesophageal reflux disease)   . Headache(784.0)    tension headaches non recent  . History of chicken pox   . History of kidney stones   . Hypertension    treated with HCTZ  . Parkinson's disease ([place])    dx'ed 15 years ago  . PTSD (post-traumatic stress disorder)   . Shortness of breath dyspnea   . Sleep apnea    doesn't use C-pap  . Varicose veins     Past Surgical History:  Procedure Laterality Date  . CHOLECYSTECTOMY N/A 10/22/2014   Procedure: LAPAROSCOPIC CHOLECYSTECTOMY WITH INTRAOPERATIVE CHOLANGIOGRAM;  Surgeon: Dia Crawford III, MD;  Location: [facility] ORS;  Service: General;  Laterality: N/A;  . cyst removed      from lip as a child  . LUMBAR LAMINECTOMY/DECOMPRESSION MICRODISCECTOMY Bilateral 12/14/2012   Procedure: Bilateral lumbar three-four, four-five decompressive laminotomy/foraminotomy;  Surgeon: Charlie Pitter, MD;  Location: [place] NEURO ORS;  Service: Neurosurgery;  Laterality: Bilateral;  . PULSE GENERATOR IMPLANT Bilateral 12/13/2013   Procedure: Bilateral implantable pulse generator placement;  Surgeon: Erline Levine, MD;  Location: [place] NEURO ORS;  Service: Neurosurgery;   Laterality: Bilateral;  Bilateral implantable pulse generator placement  . skin cancer removed     from ears,   12 lft arm  rt leg 15  . SUBTHALAMIC STIMULATOR INSERTION Bilateral 12/06/2013   Procedure: SUBTHALAMIC STIMULATOR INSERTION;  Surgeon: Erline Levine, MD;  Location: [place] NEURO ORS;  Service: Neurosurgery;  Laterality: Bilateral;  Bilateral deep brain stimulator placement    There were no vitals filed for this visit.      Subjective Assessment - 05/19/16 0811    Subjective "Doin' fine."   Patient is accompained by: --  alone   Currently in Pain? No/denies               ADULT SLP TREATMENT - 05/19/16 0811      General Information   Behavior/Cognition Alert;Cooperative;Pleasant mood     Treatment Provided   Treatment provided Dysphagia     Dysphagia Treatment   Temperature Spikes Noted No   Respiratory Status Room air   Oral Cavity - Dentition Adequate natural dentition   Treatment Methods Therapeutic exercise;Skilled observation;Compensation strategy training;Patient/caregiver education   Patient observed directly with PO's Yes   Type of PO's observed Thin liquids   Pharyngeal Phase Signs & Symptoms Delayed throat clear  50% of the time with 6 sips   Other treatment/comments When presented with the option of performring dysphagia exercises to improve swallowing skills, pt agrees this would be something he would like  to pursue. SLP explained rationale for dysphagia HEP, stressing that HEP needed to be done consistently or little effect would be made on swallowing ability. SLP led pt through the HEP and pt had much difficulty with triggering a swallow with Mendelsohn. SLP used verbal cues of "1-2-3- swallow!" without change. SLP told pt to swallow with extra effort for that exercise if he coudl not accomplish swallow-hold motion in 3-4 attempts. SLP reviewed swallow precautions with pt and he req'd min-mod A usually faded to min A occasionally. Pt did not always follow  small sips with sips during session.     Cognitive-Linquistic Treatment   Treatment focused on --     Assessment / Recommendations / Plan   Plan Goals updated     Progression Toward Goals   Progression toward goals Progressing toward goals          SLP Education - 05/19/16 0901    Education provided Yes   Education Details dysphagia HEP, swallow precuautions   Person(s) Educated Patient   Methods Explanation;Demonstration;Verbal cues;Handout   Comprehension Verbalized understanding;Returned demonstration;Verbal cues required;Need further instruction          SLP Short Term Goals - 05/19/16 0905      SLP SHORT TERM GOAL #1   Title pt will maintain speech volume appropriate for 95-100% intelligibilty in 7 minutes simple conversation with rare min A   Time 4   Period Weeks   Status On-going     SLP SHORT TERM GOAL #2   Title pt will demo emergent awareness re: reduced speech loudness in conversation, by repeating hjimself spontaneously 70% of opportunities   Time 4   Period Weeks   Status On-going     SLP SHORT TERM GOAL #3   Title pt will demo knowledge of memory compensations during 3 therapy sessions   Time 4   Period Weeks   Status On-going     SLP SHORT TERM GOAL #4   Title pt will perform dysphagia HEP with occasional min cues over three sessions    Time 4   Period Weeks   Status New          SLP Long Term Goals - 05/19/16 [id number]      SLP LONG TERM GOAL #1   Title pt will maintain speech volume adequate to achieve at least 95% intelligibility in a noisy environment over 10 minutes of simple to mod complex conversation over three sessions   Time 8   Period Weeks   Status On-going     SLP LONG TERM GOAL #2   Title pt will demo emergent awareness of low speech volume in 10 minutes conversation by self-repeating in 75% of opportunities   Time 8   Period Weeks   Status On-going     SLP LONG TERM GOAL #3   Title pt will complete dysphagia HEP with  rare min cues over three sessions   Time 8   Period Weeks   Status New          Plan - 05/19/16 [id number]    Clinical Impression Statement Today pt demonstrated dysarthria at the sentence level. When reviewing his swallow precautions, pt stated he would like to do HEP to attempt to strengthen swallowing musculature so SLP introduced swallowing exercises to him. Pt stated he would complete 6/7 days/week. SLP added dysphagia to visit diagnoses, goals for dysphagia HEP, and will send new cert to Dr. Carles Collet for review.   Speech Therapy Frequency 2x / week  Duration --  8 weeks   Treatment/Interventions Compensatory techniques;Cognitive reorganization;SLP instruction and feedback;Internal/external aids;Functional tasks;Patient/family education;Cueing hierarchy   Potential to Achieve Goals Good   Potential Considerations Ability to learn/carryover information;Cooperation/participation level;Severity of impairments   SLP Home Exercise Plan provided today   Consulted and Agree with Plan of Care Patient      Patient will benefit from skilled therapeutic intervention in order to improve the following deficits and impairments:   Dysarthria and anarthria - Plan: SLP plan of care cert/re-cert  Cognitive communication deficit - Plan: SLP plan of care cert/re-cert  Dysphagia, oropharyngeal - Plan: SLP plan of care cert/re-cert    Problem List Patient Active Problem List   Diagnosis Date Noted  . Trochanteric bursitis 03/03/2016  . Left hand pain 10/30/2015  . Fracture, finger, multiple sites 10/30/2015  . Colon cancer screening 07/23/2015  . Screening examination for infectious disease 07/23/2015  . Gout 02/18/2015  . Depression 01/06/2015  . Irritation of eyelid 08/20/2014  . Dupuytren's contracture 08/20/2014  . Cough 07/21/2014  . Lumbar stenosis with neurogenic claudication 06/13/2014  . Preop cardiovascular exam 05/28/2014  . S/P deep brain stimulator placement 05/08/2014  . PD  (Parkinson's disease) ([place]) 05/08/2014  . Joint pain 01/22/2014  . Medicare annual wellness visit, initial 01/19/2014  . Advance care planning 01/19/2014  . Parkinson's disease ([place]) 12/13/2013  . PTSD (post-traumatic stress disorder) 06/13/2013  . Erectile dysfunction 06/13/2013  . HLD (hyperlipidemia) 06/13/2013  . Hip pain 06/10/2013  . Pain in joint, shoulder region 06/10/2013  . Right leg swelling 06/03/2013  . Essential hypertension 06/03/2013  . Bradycardia by electrocardiogram 06/03/2013  . Obstructive sleep apnea 03/12/2013  . Spinal stenosis, lumbar region, with neurogenic claudication 12/14/2012    [facility], [place]  05/19/2016, 9:09 AM  [facility] [address] [place], [place], [address] Phone: [phone]   Fax:  [phone]   Name: [name] MRN: [id number] Date of Birth: [birth year]

## 2016-05-19 NOTE — Therapy (Signed)
[facility] [address] [place], [place], [address] Phone: [phone]   Fax:  [phone]  Physical Therapy Treatment  Patient Details  Name: [name] MRN: [id number] Date of Birth: 1950/07/14 Referring Provider: Alonza Bogus  Encounter Date: 05/19/2016      PT End of Session - 05/19/16 2156    Visit Number 2   Number of Visits 13   Date for PT Re-Evaluation 07/05/16   Authorization Type [employer] P[employer]-GCODE every 10th visit   PT Start Time 0848   PT Stop Time 0930   PT Time Calculation (min) 42 min   Equipment Utilized During Treatment Gait belt   Activity Tolerance Patient tolerated treatment well   Behavior During Therapy [facility] for tasks assessed/performed      Past Medical History:  Diagnosis Date  . Arthritis   . Bradycardia   . Cancer [facility]) 2013   skin cancer  . Depression    ptsd  . Dysrhythmia    chronic slow heart rate  . GERD (gastroesophageal reflux disease)   . Headache(784.0)    tension headaches non recent  . History of chicken pox   . History of kidney stones   . Hypertension    treated with HCTZ  . Parkinson's disease ([place])    dx'ed 15 years ago  . PTSD (post-traumatic stress disorder)   . Shortness of breath dyspnea   . Sleep apnea    doesn't use C-pap  . Varicose veins     Past Surgical History:  Procedure Laterality Date  . CHOLECYSTECTOMY N/A 10/22/2014   Procedure: LAPAROSCOPIC CHOLECYSTECTOMY WITH INTRAOPERATIVE CHOLANGIOGRAM;  Surgeon: Dia Crawford III, MD;  Location: [facility] ORS;  Service: General;  Laterality: N/A;  . cyst removed      from lip as a child  . LUMBAR LAMINECTOMY/DECOMPRESSION MICRODISCECTOMY Bilateral 12/14/2012   Procedure: Bilateral lumbar three-four, four-five decompressive laminotomy/foraminotomy;  Surgeon: Charlie Pitter, MD;  Location: [place] NEURO ORS;  Service: Neurosurgery;  Laterality: Bilateral;  . PULSE GENERATOR IMPLANT Bilateral 12/13/2013   Procedure: Bilateral implantable pulse generator placement;  Surgeon: Erline Levine, MD;  Location: [place] NEURO ORS;  Service: Neurosurgery;  Laterality: Bilateral;  Bilateral implantable pulse generator placement  . skin cancer removed     from ears,   12 lft arm  rt leg 15  . SUBTHALAMIC STIMULATOR INSERTION Bilateral 12/06/2013   Procedure: SUBTHALAMIC STIMULATOR INSERTION;  Surgeon: Erline Levine, MD;  Location: [place] NEURO ORS;  Service: Neurosurgery;  Laterality: Bilateral;  Bilateral deep brain stimulator placement    There were no vitals filed for this visit.      Subjective Assessment - 05/19/16 0850    Subjective Pt reports having plenty of falls (6) since PT eval last week. Some days don't fall.     Patient is accompained by: Family member  wife   Patient Stated Goals Pt's goal for therapy is to help with legs and with balance.   Currently in Pain? No/denies   Pain Onset More than a month ago                     Neuro Re-education: Cues provided for increased forward lean, use of visual targets for upright standing, slowed, deliberate pacing of movements.  Pt has only 2 episodes of quick stand with posterior pushing upon standing.     [facility] Adult PT Treatment/Exercise - 05/19/16 0853      Transfers   Transfers Sit to Stand;Stand to  Sit   Sit to Stand 5: Supervision;With upper extremity assist;From chair/3-in-1   Stand to Sit 5: Supervision;Without upper extremity assist;To chair/3-in-1   Number of Reps 10 reps;Other reps (comment);Other sets (comment)   Comments 12 reps sit<>stand from 18" chair, then 10 reps sit<>stand from 16" chair, with initial cues for technique.  Then round-robin sit<>stand turning activity to sit at varied chairs, with cues for weightshifting with turns.     Ambulation/Gait   Ambulation/Gait Yes   Ambulation/Gait Assistance 5: Supervision   Ambulation/Gait Assistance Details With cane, pt needs cues for cane sequence and for widened BOS.    Ambulation Distance (Feet) 240 Feet  400 x 2   Assistive device Straight cane  then U-step RW   Gait Pattern Step-through pattern;Decreased step length - right;Decreased step length - left;Decreased trunk rotation;Narrow base of support;Decreased arm swing - right;Poor foot clearance - left;Poor foot clearance - right  Slowed overall gait pattern   Gait Comments Gait with U-step RW with turns, narrow spaces, tight turns and changes of direction, with supervision.  Discussed benefits of and recommended use of U-step RW at home for improved safety with gait.                PT Education - 05/19/16 2155    Education provided Yes   Education Details Recommended use of U-Step RW for gait at home for improved safety with gait   Person(s) Educated Patient   Methods Explanation;Demonstration   Comprehension Verbalized understanding;Returned demonstration          PT Short Term Goals - 05/09/16 2121      PT SHORT TERM GOAL #1   Title Pt will perform HEP with family's supervision, for improved balance, transfers, and gait.  TARGET 06/08/16   Time 4   Period Weeks   Status New     PT SHORT TERM GOAL #2   Title Pt will perform 5x sit<>stand in less than or equal to 15 seconds for improved efficiency and safety with transfers.   Time 4   Status New     PT SHORT TERM GOAL #3   Title Pt will improve TUG score to less than or equal to 13.5 seconds for decreased fall risk.   Time 4   Period Weeks   Status New     PT SHORT TERM GOAL #4   Title Pt will perform floor>stand transfer with min assist of family member for improved fall recovery.   Time 4   Period Weeks   Status New     PT SHORT TERM GOAL #5   Title Pt will verbalize at least 3 means to decrease pain along R lateral thigh/iliotibial band.   Time 4   Period Weeks   Status New           PT Long Term Goals - 05/09/16 2124      PT LONG TERM GOAL #1   Title Pt/family will verabalize understanding of fall  prevention/tips to reduce freezing with gait in the home environment.  TARGET 07/07/16   Time 6   Period Weeks   Status New     PT LONG TERM GOAL #2   Title Pt will improve gait velocity to at least 2.62 ft/sec for improved community ambulator status (with appropriate assistive device).   Time 6   Period Weeks   Status New     PT LONG TERM GOAL #3   Title Pt will report at least 25% improvement in performance  of low surface transfers for improved ability to get in and out of car as well as restaraunt seating.    Time 6   Period Weeks   Status New     PT LONG TERM GOAL #4   Title Pt/wife will verbalize understanding of plans for continued community fitness upon D/C from PT.   Time 6   Period Weeks   Status New     PT LONG TERM GOAL #5   Title Pt will demonstrate proper use and technique of U-step RW for improved household and community gait.   Time 6   Period Weeks   Status New               Plan - 05/19/16 2200    Clinical Impression Statement Skilled PT session focused on transfer training and gait training with U-Step RW.  Pt appears to have increased gait safety with use of U-Step RW and PT recommends use of U-step for improved safety with gait.  Pt will continue to benefit from skilled PT to address gait, balance.   Rehab Potential Good   PT Frequency 2x / week   PT Duration 6 weeks   PT Treatment/Interventions ADLs/Self Care Home Management;Functional mobility training;Gait training;DME Instruction;Neuromuscular re-education;Patient/family education;Balance training;Therapeutic exercise;Therapeutic activities;Manual techniques   PT Next Visit Plan Review transfers, gait with U-step RW; standing balance activities   Consulted and Agree with Plan of Care Patient      Patient will benefit from skilled therapeutic intervention in order to improve the following deficits and impairments:  Abnormal gait, Decreased balance, Decreased mobility, Decreased knowledge of use  of DME, Decreased safety awareness, Decreased strength, Difficulty walking, Postural dysfunction, Pain  Visit Diagnosis: Other abnormalities of gait and mobility  Unsteadiness on feet  Abnormal posture     Problem List Patient Active Problem List   Diagnosis Date Noted  . Trochanteric bursitis 03/03/2016  . Left hand pain 10/30/2015  . Fracture, finger, multiple sites 10/30/2015  . Colon cancer screening 07/23/2015  . Screening examination for infectious disease 07/23/2015  . Gout 02/18/2015  . Depression 01/06/2015  . Irritation of eyelid 08/20/2014  . Dupuytren's contracture 08/20/2014  . Cough 07/21/2014  . Lumbar stenosis with neurogenic claudication 06/13/2014  . Preop cardiovascular exam 05/28/2014  . S/P deep brain stimulator placement 05/08/2014  . PD (Parkinson's disease) ([place]) 05/08/2014  . Joint pain 01/22/2014  . Medicare annual wellness visit, initial 01/19/2014  . Advance care planning 01/19/2014  . Parkinson's disease ([place]) 12/13/2013  . PTSD (post-traumatic stress disorder) 06/13/2013  . Erectile dysfunction 06/13/2013  . HLD (hyperlipidemia) 06/13/2013  . Hip pain 06/10/2013  . Pain in joint, shoulder region 06/10/2013  . Right leg swelling 06/03/2013  . Essential hypertension 06/03/2013  . Bradycardia by electrocardiogram 06/03/2013  . Obstructive sleep apnea 03/12/2013  . Spinal stenosis, lumbar region, with neurogenic claudication 12/14/2012    Michela Herst W. 05/19/2016, 10:04 PM  Frazier Butt., PT  [facility] [address] [place], [place], [address] Phone: [phone]   Fax:  [phone]  Name: [name] MRN: [id number] Date of Birth: January 28, 1951

## 2016-05-25 ENCOUNTER — Ambulatory Visit: Payer: Medicare PPO

## 2016-05-25 ENCOUNTER — Ambulatory Visit: Payer: Medicare PPO | Admitting: Occupational Therapy

## 2016-05-25 ENCOUNTER — Ambulatory Visit: Payer: Medicare PPO | Admitting: Rehabilitative and Restorative Service Providers"

## 2016-05-25 DIAGNOSIS — R29898 Other symptoms and signs involving the musculoskeletal system: Secondary | ICD-10-CM

## 2016-05-25 DIAGNOSIS — M6281 Muscle weakness (generalized): Secondary | ICD-10-CM | POA: Diagnosis not present

## 2016-05-25 DIAGNOSIS — R29818 Other symptoms and signs involving the nervous system: Secondary | ICD-10-CM

## 2016-05-25 DIAGNOSIS — R293 Abnormal posture: Secondary | ICD-10-CM

## 2016-05-25 DIAGNOSIS — R2689 Other abnormalities of gait and mobility: Secondary | ICD-10-CM

## 2016-05-25 DIAGNOSIS — R41844 Frontal lobe and executive function deficit: Secondary | ICD-10-CM | POA: Diagnosis not present

## 2016-05-25 DIAGNOSIS — R2681 Unsteadiness on feet: Secondary | ICD-10-CM | POA: Diagnosis not present

## 2016-05-25 DIAGNOSIS — R278 Other lack of coordination: Secondary | ICD-10-CM | POA: Diagnosis not present

## 2016-05-25 DIAGNOSIS — R471 Dysarthria and anarthria: Secondary | ICD-10-CM | POA: Diagnosis not present

## 2016-05-25 DIAGNOSIS — R41841 Cognitive communication deficit: Secondary | ICD-10-CM | POA: Diagnosis not present

## 2016-05-25 DIAGNOSIS — R1312 Dysphagia, oropharyngeal phase: Secondary | ICD-10-CM

## 2016-05-25 NOTE — Therapy (Signed)
[facility] [address] [place], [place], [address] Phone: [phone]   Fax:  [phone]  Occupational Therapy Treatment  Patient Details  Name: [name] MRN: [id number] Date of Birth: 1951/03/13 Referring Provider: Dr. Wells Guiles Tat  Encounter Date: 05/25/2016      OT End of Session - 05/25/16 1309    Visit Number 4   Number of Visits 17   Date for OT Re-Evaluation 07/07/16   Authorization - Visit Number 4   Authorization - Number of Visits 10   OT Start Time 1150   OT Stop Time 1230   OT Time Calculation (min) 40 min   Activity Tolerance Patient tolerated treatment well   Behavior During Therapy [facility] for tasks assessed/performed      Past Medical History:  Diagnosis Date  . Arthritis   . Bradycardia   . Cancer [facility]) 2013   skin cancer  . Depression    ptsd  . Dysrhythmia    chronic slow heart rate  . GERD (gastroesophageal reflux disease)   . Headache(784.0)    tension headaches non recent  . History of chicken pox   . History of kidney stones   . Hypertension    treated with HCTZ  . Parkinson's disease ([place])    dx'ed 15 years ago  . PTSD (post-traumatic stress disorder)   . Shortness of breath dyspnea   . Sleep apnea    doesn't use C-pap  . Varicose veins     Past Surgical History:  Procedure Laterality Date  . CHOLECYSTECTOMY N/A 10/22/2014   Procedure: LAPAROSCOPIC CHOLECYSTECTOMY WITH INTRAOPERATIVE CHOLANGIOGRAM;  Surgeon: Dia Crawford III, MD;  Location: [facility] ORS;  Service: General;  Laterality: N/A;  . cyst removed      from lip as a child  . LUMBAR LAMINECTOMY/DECOMPRESSION MICRODISCECTOMY Bilateral 12/14/2012   Procedure: Bilateral lumbar three-four, four-five decompressive laminotomy/foraminotomy;  Surgeon: Charlie Pitter, MD;  Location: [place] NEURO ORS;  Service: Neurosurgery;  Laterality: Bilateral;  . PULSE GENERATOR IMPLANT Bilateral 12/13/2013   Procedure: Bilateral implantable pulse  generator placement;  Surgeon: Erline Levine, MD;  Location: [place] NEURO ORS;  Service: Neurosurgery;  Laterality: Bilateral;  Bilateral implantable pulse generator placement  . skin cancer removed     from ears,   12 lft arm  rt leg 15  . SUBTHALAMIC STIMULATOR INSERTION Bilateral 12/06/2013   Procedure: SUBTHALAMIC STIMULATOR INSERTION;  Surgeon: Erline Levine, MD;  Location: [place] NEURO ORS;  Service: Neurosurgery;  Laterality: Bilateral;  Bilateral deep brain stimulator placement    There were no vitals filed for this visit.      Subjective Assessment - 05/25/16 1249    Subjective  Pt reports 2 falls since last visit, mostly with sit>stand.   Pertinent History Parkinson's diagnosis approx 17 yrs ago, hx of 3 back surgeries (last approx 2 yrs ago), bilateral DBS placement approx 3 yrs ago, multiple falls, R 3rd digit fx 10/2015, HTN, bradycardia, PTSD, lumbar stenosis, HDL, Dupuytren contracture, depression, gout    Limitations Bilateral Deep Brain Stimulator; Fall risk   Patient Stated Goals safety per wife   Currently in Pain? Yes                Discussion with pt and wife regarding safety at home. Pt is not consistently using his walker at home and he had 2 falls yesterday.         [facility]) - 05/25/16 1304    PWR! exercises Moves in supine  PWR! Up x10   PWR! Rock [employer]! Twist x20   Comments mod v.c and demonstration for positioning   PWR! Up x 10 seated in rep for sit to stand             OT Education - 05/25/16 1305    Education provided Yes   Education Details PWR! supine seated PWR! up, review coordination HEP( card, penny and ball activities)   Person(s) Educated Patient;Spouse   Methods Explanation;Demonstration;Tactile cues;Verbal cues   Comprehension Verbalized understanding;Returned demonstration;Verbal cues required  mod v.c. and demonstration          OT Short Term Goals - 05/10/16 0939      OT SHORT TERM GOAL #1   Title Pt/caregiver  will be independent with PD-specific HEP.--check STGs 06/09/16   Time 4   Period Weeks   Status New     OT SHORT TERM GOAL #2   Title Pt will improve coordination for ADLs as shown by improving time on 9-hole peg test by at least 5sec with dominant RUE.   Baseline 60.56sec   Time 4   Period Weeks   Status New     OT SHORT TERM GOAL #3   Title Pt will improve coordination/functional reaching for ADLs as shown by improving box and blocks score by at least 4 bilaterally.   Baseline R-29, L-33 blocks   Time 4   Period Weeks   Status New     OT SHORT TERM GOAL #4   Title Pt will incr ease with eating as shown by completing PPT#2 in 15sec or less.   Baseline 17.53sec   Time 4   Period Weeks   Status New     OT SHORT TERM GOAL #5   Title Pt/caregiver will verbalize ways to prevent future complications and appropriate community resources prn.   Time 4   Period Weeks   Status New           OT Long Term Goals - 05/10/16 0944      OT LONG TERM GOAL #1   Title Pt/caregiver will verbalize understanding of adaptive strategies/equipment to incr ease/safety/independence with ADLs/IADLs.--check LTGs 07/07/16   Baseline -----   Time 8   Period Weeks   Status New     OT LONG TERM GOAL #2   Title Pt will improve coordination for ADLs as shown by improving time on 9-hole peg test by at least 10sec with dominant RUE.   Baseline 60.56sec   Time 8   Period Weeks   Status New     OT LONG TERM GOAL #3   Title Pt will improve coordination/functional reaching for ADLs as shown by improving box and blocks score by at least 8 with RUE.   Baseline R-29 blocks   Time 8   Period Weeks   Status New     OT LONG TERM GOAL #4   Title Pt will fasten/unfasten 3 buttons in less than 45sec for incr ease with dressing.   Baseline 53.0sec   Time 8   Period Weeks   Status New     OT LONG TERM GOAL #5   Title Pt will improve balance for ADLs/IADLs as shown by improving standing functional reach  test by at least 2 inches with LUE.   Baseline R-8", L-6"   Time 8   Period Weeks   Status New     OT LONG TERM GOAL #6   Title ---------   Baseline -------  OT LONG TERM GOAL #7   Title -----   Baseline ------               Plan - 05/25/16 1251    Clinical Impression Statement Pt is progressing slowly towards goals. Pt progress is limited by cognitive deficits.   Rehab Potential Good   Clinical Impairments Affecting Rehab Potential cognitive deficits, severity of deficits, hip pain   OT Frequency 2x / week   OT Duration 8 weeks   OT Treatment/Interventions Self-care/ADL training;Moist Heat;Cryotherapy;Therapeutic exercise;Parrafin;DME and/or AE instruction;Therapist, nutritional;Therapeutic activities;Patient/family education;Balance training;Cognitive remediation/compensation;Splinting;Manual Therapy;Neuromuscular education;Ultrasound;Fluidtherapy;Energy conservation;Passive range of motion;Therapeutic exercises   Plan strategies for ADLs to minimize fall risk   OT Home Exercise Plan PWR! supine, basic coordination(cards only), PWR! up seated   Consulted and Agree with Plan of Care Patient;Family member/caregiver      Patient will benefit from skilled therapeutic intervention in order to improve the following deficits and impairments:  Decreased mobility, Decreased balance, Abnormal gait, Pain, Impaired tone, Impaired perceived functional ability, Decreased knowledge of precautions, Decreased coordination, Decreased activity tolerance, Decreased cognition, Decreased knowledge of use of DME, Decreased safety awareness, Decreased strength, Impaired UE functional use, Improper spinal/pelvic alignment, Improper body mechanics  Visit Diagnosis: Other symptoms and signs involving the nervous system  Other symptoms and signs involving the musculoskeletal system  Abnormal posture  Other lack of coordination    Problem List Patient Active Problem List   Diagnosis  Date Noted  . Trochanteric bursitis 03/03/2016  . Left hand pain 10/30/2015  . Fracture, finger, multiple sites 10/30/2015  . Colon cancer screening 07/23/2015  . Screening examination for infectious disease 07/23/2015  . Gout 02/18/2015  . Depression 01/06/2015  . Irritation of eyelid 08/20/2014  . Dupuytren's contracture 08/20/2014  . Cough 07/21/2014  . Lumbar stenosis with neurogenic claudication 06/13/2014  . Preop cardiovascular exam 05/28/2014  . S/P deep brain stimulator placement 05/08/2014  . PD (Parkinson's disease) ([place]) 05/08/2014  . Joint pain 01/22/2014  . Medicare annual wellness visit, initial 01/19/2014  . Advance care planning 01/19/2014  . Parkinson's disease ([place]) 12/13/2013  . PTSD (post-traumatic stress disorder) 06/13/2013  . Erectile dysfunction 06/13/2013  . HLD (hyperlipidemia) 06/13/2013  . Hip pain 06/10/2013  . Pain in joint, shoulder region 06/10/2013  . Right leg swelling 06/03/2013  . Essential hypertension 06/03/2013  . Bradycardia by electrocardiogram 06/03/2013  . Obstructive sleep apnea 03/12/2013  . Spinal stenosis, lumbar region, with neurogenic claudication 12/14/2012    Elleanor Guyett 05/25/2016, 1:09 PM  [facility] [address] [place], [place], [address] Phone: [phone]   Fax:  [phone]  Name: DASHAN KINLOCK MRN: [id number] Date of Birth: [birth year]

## 2016-05-25 NOTE — Patient Instructions (Signed)
Read out loud with loud and slow speech for 10 minutes in one-,minute increments.

## 2016-05-25 NOTE — Therapy (Signed)
[facility] [address] [place], [place], [address] Phone: [phone]   Fax:  [phone]  Speech Language Pathology Treatment  Patient Details  Name: [name] MRN: [id number] Date of Birth: Nov 10, 1950 Referring Provider: Alonza Bogus, DO  Encounter Date: 05/25/2016      End of Session - 05/25/16 1105    Visit Number 3   Number of Visits 17   Date for SLP Re-Evaluation 07/22/16   SLP Start Time 0933   SLP Stop Time  [id number]   SLP Time Calculation (min) 42 min   Activity Tolerance Patient tolerated treatment well      Past Medical History:  Diagnosis Date  . Arthritis   . Bradycardia   . Cancer [facility]) 2013   skin cancer  . Depression    ptsd  . Dysrhythmia    chronic slow heart rate  . GERD (gastroesophageal reflux disease)   . Headache(784.0)    tension headaches non recent  . History of chicken pox   . History of kidney stones   . Hypertension    treated with HCTZ  . Parkinson's disease ([place])    dx'ed 15 years ago  . PTSD (post-traumatic stress disorder)   . Shortness of breath dyspnea   . Sleep apnea    doesn't use C-pap  . Varicose veins     Past Surgical History:  Procedure Laterality Date  . CHOLECYSTECTOMY N/A 10/22/2014   Procedure: LAPAROSCOPIC CHOLECYSTECTOMY WITH INTRAOPERATIVE CHOLANGIOGRAM;  Surgeon: Dia Crawford III, MD;  Location: [facility] ORS;  Service: General;  Laterality: N/A;  . cyst removed      from lip as a child  . LUMBAR LAMINECTOMY/DECOMPRESSION MICRODISCECTOMY Bilateral 12/14/2012   Procedure: Bilateral lumbar three-four, four-five decompressive laminotomy/foraminotomy;  Surgeon: Charlie Pitter, MD;  Location: [place] NEURO ORS;  Service: Neurosurgery;  Laterality: Bilateral;  . PULSE GENERATOR IMPLANT Bilateral 12/13/2013   Procedure: Bilateral implantable pulse generator placement;  Surgeon: Erline Levine, MD;  Location: [place] [facility]UR[facility] ORS;  Service: Neurosurgery;  Laterality: Bilateral;   Bilateral implantable pulse generator placement  . skin cancer removed     from ears,   12 lft arm  rt leg 15  . SUBTHALAMIC STIMULATOR INSERTION Bilateral 12/06/2013   Procedure: SUBTHALAMIC STIMULATOR INSERTION;  Surgeon: Erline Levine, MD;  Location: [place] NEURO ORS;  Service: Neurosurgery;  Laterality: Bilateral;  Bilateral deep brain stimulator placement    There were no vitals filed for this visit.      Subjective Assessment - 05/25/16 0943    Subjective "I was carrying four bottles of water and fell yesterday, tripped over the sidwalk."   Patient is accompained by: Family member  wife               ADULT SLP TREATMENT - 05/25/16 0946      General Information   Behavior/Cognition Alert;Cooperative;Pleasant mood     Treatment Provided   Treatment provided Cognitive-Linquistic     Cognitive-Linquistic Treatment   Treatment focused on Dysarthria   Skilled Treatment SLP educated/strongly suggested pt do less at home - pt fell twice at home while doing something he was likely not safe with. SLP also educated pt/wife on progression of dopamine loss as explanation to why pt with incr'd cognitive-linguistic deficits compared to 3-4 years ago. Pt read aloud, focusing on loud, slow (separating words) speech, with approx 33% success in maintaining loud and slow speech. Pt maintained loudness longer than maintaining slower speech, approx 75-80% throughout a selection  read vs. approx 50-60% throughout a selection. Pt recorded his speech and listened back and was successful at [facility] when he sped up and his intelligibility decr'd. SLP encouraged pt to use this during reading practice at home.      Assessment / Recommendations / Plan   Plan Continue with current plan of care     Progression Toward Goals   Progression toward goals Progressing toward goals          SLP Education - 05/25/16 1104    Education provided Yes   Education Details cognitive changes in Parkinson's, need for  slowed and louder speech   Person(s) Educated Patient;Spouse   Methods Explanation;Demonstration;Verbal cues   Comprehension Verbalized understanding;Verbal cues required;Need further instruction          SLP Short Term Goals - 05/25/16 1241      SLP SHORT TERM GOAL #1   Title pt will maintain speech volume appropriate for 95-100% intelligibilty in 7 minutes simple conversation with rare min A   Time 3   Period Weeks   Status On-going     SLP SHORT TERM GOAL #2   Title pt will demo emergent awareness re: reduced speech loudness in conversation, by repeating hjimself spontaneously 70% of opportunities   Time 3   Period Weeks   Status On-going     SLP SHORT TERM GOAL #3   Title pt will demo knowledge of memory compensations during 3 therapy sessions   Time 3   Period Weeks   Status On-going     SLP SHORT TERM GOAL #4   Title pt will perform dysphagia HEP with occasional min cues over three sessions    Time 3   Period Weeks   Status On-going          SLP Long Term Goals - 05/25/16 1242      SLP LONG TERM GOAL #1   Title pt will maintain speech volume adequate to achieve at least 95% intelligibility in a noisy environment over 10 minutes of simple to mod complex conversation over three sessions   Time 7   Period Weeks   Status On-going     SLP LONG TERM GOAL #2   Title pt will demo emergent awareness of low speech volume in 10 minutes conversation by self-repeating in 75% of opportunities   Time 77   Period Weeks   Status On-going     SLP LONG TERM GOAL #3   Title pt will complete dysphagia HEP with rare min cues over three sessions   Time 7   Period Weeks   Status On-going          Plan - 05/25/16 1239    Clinical Impression Statement Today pt demonstrated reduced communicative effectiveness due to decr'd speech loudness and rushes of speech with reduced awareness. By session end, pt had downloaded recording app to record himself to check his  intelligiblity with homework. Skilled ST remains needed to improve pt's speech clarity as well as strengthen his swallowing  musculature.   Speech Therapy Frequency 2x / week   Duration --  8 weeks   Treatment/Interventions Compensatory techniques;Cognitive reorganization;SLP instruction and feedback;Internal/external aids;Functional tasks;Patient/family education;Cueing hierarchy   Potential to Achieve Goals Good   Potential Considerations Ability to learn/carryover information;Cooperation/participation level;Severity of impairments   SLP Home Exercise Plan provided today   Consulted and Agree with Plan of Care Patient      Patient will benefit from skilled therapeutic intervention in order to improve the  following deficits and impairments:   Dysarthria and anarthria  Cognitive communication deficit  Dysphagia, oropharyngeal    Problem List Patient Active Problem List   Diagnosis Date Noted  . Trochanteric bursitis 03/03/2016  . Left hand pain 10/30/2015  . Fracture, finger, multiple sites 10/30/2015  . Colon cancer screening 07/23/2015  . Screening examination for infectious disease 07/23/2015  . Gout 02/18/2015  . Depression 01/06/2015  . Irritation of eyelid 08/20/2014  . Dupuytren's contracture 08/20/2014  . Cough 07/21/2014  . Lumbar stenosis with neurogenic claudication 06/13/2014  . Preop cardiovascular exam 05/28/2014  . S/P deep brain stimulator placement 05/08/2014  . PD (Parkinson's disease) ([place]) 05/08/2014  . Joint pain 01/22/2014  . Medicare annual wellness visit, initial 01/19/2014  . Advance care planning 01/19/2014  . Parkinson's disease ([place]) 12/13/2013  . PTSD (post-traumatic stress disorder) 06/13/2013  . Erectile dysfunction 06/13/2013  . HLD (hyperlipidemia) 06/13/2013  . Hip pain 06/10/2013  . Pain in joint, shoulder region 06/10/2013  . Right leg swelling 06/03/2013  . Essential hypertension 06/03/2013  . Bradycardia by electrocardiogram  06/03/2013  . Obstructive sleep apnea 03/12/2013  . Spinal stenosis, lumbar region, with neurogenic claudication 12/14/2012    [facility] ,[place], CCC-SLP  05/25/2016, 12:42 PM  [facility] [address] [place], [place], [address] Phone: [phone]   Fax:  [phone]   Name: [name] MRN: [id number] Date of Birth: [birth year]
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
## 2016-05-27 ENCOUNTER — Ambulatory Visit: Payer: Medicare PPO | Admitting: Physical Therapy

## 2016-05-27 ENCOUNTER — Ambulatory Visit: Payer: Medicare PPO | Admitting: Occupational Therapy

## 2016-05-27 DIAGNOSIS — R41844 Frontal lobe and executive function deficit: Secondary | ICD-10-CM | POA: Diagnosis not present

## 2016-05-27 DIAGNOSIS — R293 Abnormal posture: Secondary | ICD-10-CM | POA: Diagnosis not present

## 2016-05-27 DIAGNOSIS — R29818 Other symptoms and signs involving the nervous system: Secondary | ICD-10-CM | POA: Diagnosis not present

## 2016-05-27 DIAGNOSIS — M6281 Muscle weakness (generalized): Secondary | ICD-10-CM | POA: Diagnosis not present

## 2016-05-27 DIAGNOSIS — R278 Other lack of coordination: Secondary | ICD-10-CM | POA: Diagnosis not present

## 2016-05-27 DIAGNOSIS — R41841 Cognitive communication deficit: Secondary | ICD-10-CM | POA: Diagnosis not present

## 2016-05-27 DIAGNOSIS — R2689 Other abnormalities of gait and mobility: Secondary | ICD-10-CM | POA: Diagnosis not present

## 2016-05-27 DIAGNOSIS — R471 Dysarthria and anarthria: Secondary | ICD-10-CM | POA: Diagnosis not present

## 2016-05-27 DIAGNOSIS — R29898 Other symptoms and signs involving the musculoskeletal system: Secondary | ICD-10-CM

## 2016-05-27 DIAGNOSIS — R2681 Unsteadiness on feet: Secondary | ICD-10-CM | POA: Diagnosis not present

## 2016-05-27 NOTE — Therapy (Signed)
[facility] [address] [place], [place], [address] Phone: [phone]   Fax:  [phone]  Occupational Therapy Treatment  Patient Details  Name: [name] MRN: [id number] Date of Birth: August 10, 1950 Referring Provider: Dr. Wells Guiles Tat  Encounter Date: 05/27/2016      OT End of Session - 05/27/16 1254    Visit Number 5   Number of Visits 17   Date for OT Re-Evaluation 07/07/16   Authorization Type [employer] / [employer], no auth/visit limit, G-code needed   Authorization - Visit Number 5   Authorization - Number of Visits 10   OT Start Time 0935   OT Stop Time 1015   OT Time Calculation (min) 40 min   Activity Tolerance Patient tolerated treatment well   Behavior During Therapy [facility] for tasks assessed/performed      Past Medical History:  Diagnosis Date  . Arthritis   . Bradycardia   . Cancer [facility]) 2013   skin cancer  . Depression    ptsd  . Dysrhythmia    chronic slow heart rate  . GERD (gastroesophageal reflux disease)   . Headache(784.0)    tension headaches non recent  . History of chicken pox   . History of kidney stones   . Hypertension    treated with HCTZ  . Parkinson's disease ([place])    dx'ed 15 years ago  . PTSD (post-traumatic stress disorder)   . Shortness of breath dyspnea   . Sleep apnea    doesn't use C-pap  . Varicose veins     Past Surgical History:  Procedure Laterality Date  . CHOLECYSTECTOMY N/A 10/22/2014   Procedure: LAPAROSCOPIC CHOLECYSTECTOMY WITH INTRAOPERATIVE CHOLANGIOGRAM;  Surgeon: Dia Crawford III, MD;  Location: [facility] ORS;  Service: General;  Laterality: N/A;  . cyst removed      from lip as a child  . LUMBAR LAMINECTOMY/DECOMPRESSION MICRODISCECTOMY Bilateral 12/14/2012   Procedure: Bilateral lumbar three-four, four-five decompressive laminotomy/foraminotomy;  Surgeon: Charlie Pitter, MD;  Location: [place] [facility] ORS;  Service: Neurosurgery;  Laterality: Bilateral;  .  PULSE GENERATOR IMPLANT Bilateral 12/13/2013   Procedure: Bilateral implantable pulse generator placement;  Surgeon: Erline Levine, MD;  Location: [place] [facility]UR[facility] ORS;  Service: Neurosurgery;  Laterality: Bilateral;  Bilateral implantable pulse generator placement  . skin cancer removed     from ears,   12 lft arm  rt leg 15  . SUBTHALAMIC STIMULATOR INSERTION Bilateral 12/06/2013   Procedure: SUBTHALAMIC STIMULATOR INSERTION;  Surgeon: Erline Levine, MD;  Location: [place] [facility]UR[facility] ORS;  Service: Neurosurgery;  Laterality: Bilateral;  Bilateral deep brain stimulator placement    There were no vitals filed for this visit.      Subjective Assessment - 05/27/16 1303    Pertinent History Parkinson's diagnosis approx 17 yrs ago, hx of 3 back surgeries (last approx 2 yrs ago), bilateral DBS placement approx 3 yrs ago, multiple falls, R 3rd digit fx 10/2015, HTN, bradycardia, PTSD, lumbar stenosis, HDL, Dupuytren contracture, depression, gout    Limitations Bilateral Deep Brain Stimulator; Fall risk   Patient Stated Goals safety per wife   Currently in Pain? No/denies          treatment: Reviewed supine PWR! Up, rock and twist, mod v.c. For positioning Seated PWR! up followed by practice sit to stand with pt shifting weight forwards to minimize fall risk. Seated simulated ADLs, with bag exercises( simulated donning shirt, pulling up socks and donning pants over legs in seated. ) Pt was  encouraged to donn his jacket seated due to fall risk, pt returneed demo with min A. Pt practiced fastening/ unfastening buttons after PWR! hands, min -mod v.c. For technique.                      OT Short Term Goals - 05/10/16 0939      OT SHORT TERM GOAL #1   Title Pt/caregiver will be independent with PD-specific HEP.--check STGs 06/09/16   Time 4   Period Weeks   Status New     OT SHORT TERM GOAL #2   Title Pt will improve coordination for ADLs as shown by improving time on 9-hole peg test by at least  5sec with dominant RUE.   Baseline 60.56sec   Time 4   Period Weeks   Status New     OT SHORT TERM GOAL #3   Title Pt will improve coordination/functional reaching for ADLs as shown by improving box and blocks score by at least 4 bilaterally.   Baseline R-29, L-33 blocks   Time 4   Period Weeks   Status New     OT SHORT TERM GOAL #4   Title Pt will incr ease with eating as shown by completing PPT#2 in 15sec or less.   Baseline 17.53sec   Time 4   Period Weeks   Status New     OT SHORT TERM GOAL #5   Title Pt/caregiver will verbalize ways to prevent future complications and appropriate community resources prn.   Time 4   Period Weeks   Status New           OT Long Term Goals - 05/10/16 0944      OT LONG TERM GOAL #1   Title Pt/caregiver will verbalize understanding of adaptive strategies/equipment to incr ease/safety/independence with ADLs/IADLs.--check LTGs 07/07/16   Baseline -----   Time 8   Period Weeks   Status New     OT LONG TERM GOAL #2   Title Pt will improve coordination for ADLs as shown by improving time on 9-hole peg test by at least 10sec with dominant RUE.   Baseline 60.56sec   Time 8   Period Weeks   Status New     OT LONG TERM GOAL #3   Title Pt will improve coordination/functional reaching for ADLs as shown by improving box and blocks score by at least 8 with RUE.   Baseline R-29 blocks   Time 8   Period Weeks   Status New     OT LONG TERM GOAL #4   Title Pt will fasten/unfasten 3 buttons in less than 45sec for incr ease with dressing.   Baseline 53.0sec   Time 8   Period Weeks   Status New     OT LONG TERM GOAL #5   Title Pt will improve balance for ADLs/IADLs as shown by improving standing functional reach test by at least 2 inches with LUE.   Baseline R-8", L-6"   Time 8   Period Weeks   Status New     OT LONG TERM GOAL #6   Title ---------   Baseline -------     OT LONG TERM GOAL #7   Title -----   Baseline ------                Plan - 05/27/16 1252    Clinical Impression Statement Pt is progressing slowly towards goals. He demonstrates significant cognitive deficits and decreased safety awareness, which places  him at risk for falls.   Rehab Potential Fair   Clinical Impairments Affecting Rehab Potential cognitive deficits, severity of deficits, hip pain   OT Frequency 2x / week   OT Duration 8 weeks   Plan continue to address strategies to incr. safety with ADLs.   OT Home Exercise Plan PWR! supine, basic coordination(cards only), PWR! up seated   Consulted and Agree with Plan of Care Patient      Patient will benefit from skilled therapeutic intervention in order to improve the following deficits and impairments:  Decreased mobility, Decreased balance, Abnormal gait, Pain, Impaired tone, Impaired perceived functional ability, Decreased knowledge of precautions, Decreased coordination, Decreased activity tolerance, Decreased cognition, Decreased knowledge of use of DME, Decreased safety awareness, Decreased strength, Impaired UE functional use, Improper spinal/pelvic alignment, Improper body mechanics  Visit Diagnosis: Other symptoms and signs involving the nervous system  Other symptoms and signs involving the musculoskeletal system  Abnormal posture  Other lack of coordination    Problem List Patient Active Problem List   Diagnosis Date Noted  . Trochanteric bursitis 03/03/2016  . Left hand pain 10/30/2015  . Fracture, finger, multiple sites 10/30/2015  . Colon cancer screening 07/23/2015  . Screening examination for infectious disease 07/23/2015  . Gout 02/18/2015  . Depression 01/06/2015  . Irritation of eyelid 08/20/2014  . Dupuytren's contracture 08/20/2014  . Cough 07/21/2014  . Lumbar stenosis with neurogenic claudication 06/13/2014  . Preop cardiovascular exam 05/28/2014  . S/P deep brain stimulator placement 05/08/2014  . PD (Parkinson's disease) ([place]) 05/08/2014  .  Joint pain 01/22/2014  . Medicare annual wellness visit, initial 01/19/2014  . Advance care planning 01/19/2014  . Parkinson's disease ([place]) 12/13/2013  . PTSD (post-traumatic stress disorder) 06/13/2013  . Erectile dysfunction 06/13/2013  . HLD (hyperlipidemia) 06/13/2013  . Hip pain 06/10/2013  . Pain in joint, shoulder region 06/10/2013  . Right leg swelling 06/03/2013  . Essential hypertension 06/03/2013  . Bradycardia by electrocardiogram 06/03/2013  . Obstructive sleep apnea 03/12/2013  . Spinal stenosis, lumbar region, with neurogenic claudication 12/14/2012    RINE,KATHRYN 05/27/2016, 1:04 PM  [facility] [address] [place], [place], [address] Phone: [phone]   Fax:  [phone]  Name: [name] MRN: [id number] Date of Birth: 02/07/1951

## 2016-05-27 NOTE — Progress Notes (Signed)
[name] was seen today in the movement disorders clinic for neurologic consultation at the request of Dr. Annette Stable. His PCP is at the [facility] in [place].  The consultation is for the evaluation of Parkinson's disease.  The patient is accompanied by his wife who supplements the history.  The patient previously saw Dr. Jennelle Human at [facility] and I do have several of those records.  Patient was diagnosed with Parkinson's disease about 15 years ago, at the age of 74-61 years old.  He states that the first sx was not swinging of the R arm and some minor balance changes.  Balance has gotten worse with time.   The patient is currently on carbidopa/levodopa 25/100, 3 at 9am, 2 at 1, 3 at 5pm, 2 at 9 pm and 1 carbidopa/levodopa 25/100 CR at bedtime.  He is also on Mirapex 0.5 mg 3 times a day and selegiline 5 mg daily.  Because of dyskinesia, Dr. Nicki Reaper did talk to him about DBS, but according to Dr. Bary Leriche notes, the patient was skeptical.  He did have a neuropsych evaluation at the [facility] in [place], but it appears that this was more of counseling and no testing.  He did have a nocturnal polysomnogram on 06/20/2010 demonstrating an apnea hypopnea index of 16.  07/11/13 update:  Pt is f/u today re: PD.  This patient is accompanied in the office by his spouse who supplements the history.  He is on carbidopa/levodopa, 3 at 9am, 2 at 1, 3 at 5pm, 2 at 9 pm and takes carbidopa/levodopa CR 25/100  at bedtime.  This is a total daily levodopa dose of 1100 mg per day.  He is on mirapex 0.5 mg three times per day and selegeline 5 mg in the AM.  He states, "I am ready for DBS.  Tell me about what I need to do."  Pt c/o dyskinesias.  Falls on average once per week.  No hallucinations.  No n/v.  No syncope.  States that he saw himself singing at [place] and could not believe how much dyskinesia he had.  He states that when his medications don't work, he "hits a wall."  This is sometimes very unpredictable.  07/24/13:  Present with  wife for on/off test.  Actually went to PT late yesterday afternoon and was off of medication and had a very hard time.  While he has been off medication for 36 hours, his wife reports that she thinks that he has done remarkably well.  There has been minimal tremor and he really hasn't been dragging his leg much.  He expresses desire to proceed with DBS.  10/25/13 update:  Patient presents today with his wife, who supplements the history.  The patient is preop DBS.  He is scheduled for deep brain stimulation surgery on 12/06/2013.  He is scheduled for IPG placement on 12/13/2013.  Since our last visit, he did have neuropsychometric testing.  Results of this were favorable for DBS placement.  It was recommended that he faithfully wear his CPAP.  He just had his preop MRI of the brain done.  Patient is currently taking levodopa 25/100, on the following schedule: 3/2/3/2 and then an additional carbidopa/levodopa 25/100 CR at bedtime.  This is in addition to pramipexole 0.5 mg 3 times a day and selegiline 5 mg in the morning.  Overall doing well.  No hallucinations.  No falls.  Has had some more of his chronic back pain.  01/08/14 update:  The patient  presents today for followup.  He is accompanied by his wife who supplements the history.  The patient underwent bilateral STN DBS on 12/06/2013 with generator placement on 12/13/2013.  The patient's wife reports that his Parkinson's disease symptoms have actually been much better since surgery.  He did go back to taking his previous dose of medication, however.  He is on carbidopa/levodopa 25/100 on the following schedule: 3/2/3/2 and then he takes an additional carbidopa/levodopa 25/100 CR at night.  He is also on Mirapex 0.5 mg 3 times a day as well as selegiline 5 mg in the morning.  He had 2 falls since last visit.  On the one, he actually fell down a few stairs and states that he was actually able to roll onto the carpet.  He did not get hurt.  With the other one,  he states that he was walking on a flat surface and just did not expect there to be a step downward and there was.  Again, he did not get hurt.  Wife reports minor dyskinesia.  They have noted no tremor.  Rigidity has been better.  He has been very sleepy.  Wife thinks that this is because he only sleeps about 4 hours at night because he is trying to do so much during the day.  01/16/14 update:  Patient returns today, accompanied by his wife who supplements the history.  Overall, the patient has been doing well.  He remains very sleepy.  He is not sure if that is from medication.  He refuses to wear the CPAP.  He is supposed to be on a tablet of carbidopa/levodopa 25/100 per day but has dropped down to 6 on his own.  He is on pramipexole 0.5 mg twice per day.  He does not want to start speech therapy until November, because he is going out of town.  He had one fall on the stairs since last visit.  Reports that he has an old house with small steps.  He denies any dyskinesia.  He has had some swelling in the hands, right greater than left.  He has an appointment to discuss this with his primary care physician.  He denies any tremor.    01/22/14 update:  Patient returns today for followup, accompanied by his wife who supplements the history.  Patient called me almost immediately after last visit complaining about dyskinesia and he wanted me to turn his settings back down.  I had told him to instead to decrease his levodopa up to 2 tablets in the morning, one in the afternoon and one in the evening and we had already been weaning his Mirapex.  That stopped the dyskinesias which were in the R arm and he admits that some days he is only taking 3 levodopa per day.  He is sometimes having what sounds like myoclonic jerking in the R arm.  He also c/o "aching" in the joints all over the body "but it doesn't feel like the stiffness of parkinsons disease."  He still has swelling.  He did f/u with PCP but etiology is unknown.     02/17/14 update:  This patient is accompanied in the office by his spouse who supplements the history.  Has had several falls.  Almost fell into the fireplace.  [place] backwards going down the stairs.  No serious injuries but still multiple falls.  Generally occur on the stairs.  Wrecked his [place] as missed the brake.  No tremor but did turn off his device  and notes that tremor will immediately return.  Off the mirapex but still sleeping "all the time."  Wife states that no longer doing the things that he is to work to do.  He is less involved with the church, which he used to be very passionate about.  He is not exercising like he was.  He started speech therapy today.  He had a  Modified barium swallow on 02/05/2014 that demonstrated moderate pharyngeal phase dysphagia and recommended a dysphagia 3 diet (mechanical soft) with nectar thick liquids.  They just got the nectar thick-it today.  His wife thinks that perhaps he is depressed.  She thinks that maybe that is why he is sleeping all the time.the patient also asked me about a referral to orthopedics for a rotator cuff problem on the left.  He is having severe pain, but does not necessarily want surgery.  04/15/14 update:  The patient returns today for follow-up, accompanied by his wife who supplements the history.  He remains on carbidopa/levodopa 25/100, 1 tablet 4 times per day.  He was started on Wellbutrin last visit.  This has been worked up to 300 mg daily.  His mood is much better.  His motivation is much better.  He is sleeping much less.  His balance is not very good.  He has not yet started physical therapy, but plans to next week.  He has finished LSVT loud.  He notices some tremor of the left hand and tremor of the right face.  We did have him try to turn off the DBS to see what would happen with the tremor right face and he got much worse.  He has seen Dr. Maureen Ralphs and had cortisone injections into the right shoulder.  That has helped.  He had an  EMG that demonstrated chronic multilevel radiculopathy from L2-S1 bilaterally.  There was no evidence of peripheral neuropathy.  05/08/13 update:  This patient is accompanied in the office by his spouse who supplements the history.  Overall, the patient is doing well but he is contemplating back surgery.  He saw Dr. Trenton Gammon regarding this and is supposed to have a CT myelogram soon.  He presents today because he is having some tremor of the right face and arm and left arm tremor, especially when he is stressed.  He takes 2 levodopa in the morning, one in the afternoon and one in the evening.  He really does fairly well with that.  Tremor is worse in the evening.  No falls.  He is doing fairly well with Wellbutrin for his mood.  He has put physical therapy on hold.  07/03/14 update:  Patient is accompanied by his wife, who supplements the history.  He had a back surgery on 06/13/2014 by Dr. Trenton Gammon.  Records are reviewed from the surgery.  He had an L1-L2 laminectomy and reexplored the L4-L5 laminectomies with foraminotomies.  The patient was released from rehabilitation on March 11 and subsequently came to our Parkinson's educational event.  At that event, we talked about the fact that DBS can worsen speech and he thinks that perhaps the device has caused some speech difficulties.  His wife also thinks that his mood has gotten worse, especially since his most recent back surgery.  He is doing better in terms of the back pain.  He is weaning off of gabapentin.  Physical therapy is coming into his house.  He has noticed some left hand and right leg tremor.  07/28/14 update:  Pt is  accompanied by his wife, who supplements the history.  He is continuing to have EDS, which he has had long prior to DBS therapy.  He has OSAS but has not been able to tolerate CPAP therapy.  He has no cardiac disease.  We made significant changes to his programing last visit because of stuttering/speech issues and his wife states this is much  better.  He is going to resume ST because of choking issues and that is to restart tomorrow.  He is having some tremor in the L hand and right shoulder.  When asked about mood, his wife states that "he needs an attitude adjustment some days."  She does state that his driving seems much better after his gabapentin was discontinued.  He is not having any lightheadedness.  No hallucinations.  No confusion.  11/28/14 update:  The patient is following up today, accompanied by his wife who supplements the history.  He is on carbidopa/levodopa 25/100, 2 tablets in the morning, one in the afternoon and one in the evening but admits that he really doesn't take it this way and is lucky to get in 3 per day (hadn't taken any when I saw him today at 11:15 pm).  Last visit, I added Provigil because of excessive daytime hypersomnolence and we ultimately tried to increase it over the phone to twice a day dosing, but by June, the patient had discontinued it because he was doing better from this regard.  He called me on June 6, however complaining about acting out his dreams.  We added clonazepam, 0.5 mg, half a tablet at night.  Only a week later the patient had discontinued as he felt that it perhaps worsened his mood but he also thought that it caused diarrhea.  Not long after that, however, the patient ended up in the emergency room with abdominal pain and was diagnosed with a porcelain gallbladder and had surgery to have his gallbladder removed on 10/21/2014.  His wife states today, however, that the klonopin made him seriously depressed.  He did hallucinate with the morphine in the hospital.  His wife states that they saw someone they hadn't seen in a longtime and they commented how well he looked.  He has started PT several times and had to stop for various reasons.  He attended a support group in [place].    04/01/15 update:  The patient is following up today, accompanied by his wife who supplements the history.  The  patient has Parkinson's disease and is status post deep brain stimulator placement.  He remains on carbidopa/levodopa 25/100, 2 tablets in the morning, one in the afternoon and one in the evening but instead of taking the last dose in the evening he will take it at bedtime and instead of taking the one in the middle of the afternoon he will take that late evening.  Therefore, he will end up having tremor in the right hand late in the day.  Overall, he is doing well and "most days you don't know he has PD" according to his wife.  He has been going to physical therapy at [facility].  His wife states that insurance only covered 3 weeks of therapy and then cut him off and his wife is hoping to re-enroll him after the first of the year.    I have reviewed records since our last visit.  Dr. Damita Dunnings increased his Wellbutrin XL from 300 mg to 450 mg on 01/06/2015.  That helped a lot and he  is more like his old self.   The patient has also seen Dr. Ashby Dawes since our last visit.  He has a history of known sleep apnea and has been noncompliant with his CPAP.  Dr. Ashby Dawes recommended a re-titration study and the patient is awaiting that study.  Vivid dreams continue.    07/24/15 update:  The patient follows up today, accompanied by his wife who supplements the history.  I have also reviewed prior records made available to me.  The patient is status post deep brain stimulation to the bilateral STN in August, 2015.  He remains on carbidopa/levodopa 25/100, 2 tablets three times a day.  His biggest issue has been depression.  He is on Wellbutrin XL, 450 mg daily.  Just 2 days ago his primary care physician added Lexapro.  It was sent to the mail order pharmacy and he has not gotten it yet.  He thought he was supposed to stop his Wellbutrin and asked me about that today.  He is also under the care of a counselor.  He states that he saw her yesterday.  He is not suicidal or homicidal.  His primary care physician also gave him  a prescription for a new walker because he could not use his old walker in his house because of the size.  His wife does state that he has had more difficulty with choking and swallowing.  He really is noncompliant, however, with recommendations on his prior swallowing study.  He eats whatever he wants and prior swallowing study recommended nectar thick liquids.  He does not follow this.  He is not exercising.  10/12/15 update: The patient follows up today, accompanied by his wife who supplements the history.  I have also reviewed prior records made available to me.  The patient is status post deep brain stimulation to the bilateral STN in August, 2015.  He was on carbidopa/levodopa 25/100, 2 tablets 3 times per day but he has dropped it back to 2/1/1 and states that he is doing well with that.  He did fall into a chest of drawers in May and hit his battery on the right.  That made him sore, but no hematoma.  He also fell in his shop and fell into a lampshade.   He has had shoulder pain and is seeing ortho.  Was told that he cannot have MRI so they are doing CT on wed at [facility] imaging. They are planning to move to a one level house and they will only need to put in ramps.  They plan to change the bathroom entrances to widen them and then raise toilet seats.  He just started physical therapy on 10/07/2015.  He did have a modified barium swallow on 08/07/2015.  It demonstrated mild oral pharyngeal dysphagia (an improvement from 2015 examination) and a regular diet with thin liquids was recommended.  He is on Wellbutrin XL, 450 mg daily and right after last visit, started on Lexapro for depression. He is also under the care of a counselor.   Wife states that mood is good except when he falls.  Still having vivid dreams.  He is also having back pain again and plans to have CT.  02/11/16 update:  The patient follows up, accompanied by his wife who supplements the history.  [place]  [place].  He mows the lawn on a riding mower.  The patient is status post deep brain stimulation in  August, 2015.  He is on carbidopa/levodopa 25/100, 2 tablets in the morning, one in the afternoon and one in the evening.  Fell in July and had finger fx. No surgery, just splint.  Reviewed records in that regard.  [place] down stairs in august. New house does have stairs into the house and has fallen going in.  Awaiting a ramp but it is expensive.  He went to PT in august.  Still having LBP and has been trying to get MRI lumbar spine but needs to be done at [facility]. They attempted this but pt refused to have DBS off to have it done.   In regards to depression, pt is on wellbutrin XL 450 mg daily and Lexapro was started since our last after visit and it has helped.    05/30/16 update:  Patient comes in today, accompanied by his wife who supplements the history.  Patient is on carbidopa/levodopa 25/100, 2 tablets in the morning, one in the afternoon and one in the evening.  Unfortunately, patient has had multiple falls since our last visit.  He did not break his hip but it was x-rayed.  He did go through therapy since last visit and I reviewed those records.  He is still going through all 3 modalities.  He is using his U step walker more than in the past but did fall yesterday without it.  Some hallucinations but seem to occur when awakening out of sleep and he will reach out for an animal such as a cat and nothing is there.   Neuroimaging has  previously been performed.  It is not available for my review today.  PREVIOUS MEDICATIONS: Sinemet, Sinemet CR, Mirapex and Eldpryl; klonopin (depressed)  ALLERGIES:   Allergies  Allergen Reactions  . Klonopin [Clonazepam] Other (See Comments)    Worsening mood/depression  . Morphine And Related     hallucinations    CURRENT MEDICATIONS:  Allergies as of 05/31/2016      Reactions   Klonopin [clonazepam] Other (See Comments)   Worsening  mood/depression   Morphine And Related    hallucinations      Medication List       Accurate as of 05/31/16  7:46 AM. Always use your most recent med list.          buPROPion 150 MG 24 hr tablet Commonly known as:  WELLBUTRIN XL TAKE 3 TABLETS DAILY   carbidopa-levodopa 25-100 MG tablet Commonly known as:  SINEMET IR Take by mouth. 2 AM/ 1 afternoon/ 1 evening   cholecalciferol 1000 units tablet Commonly known as:  VITAMIN D Take 1,000 Units by mouth daily.   DRY EYES OP Apply to eye.   escitalopram 10 MG tablet Commonly known as:  LEXAPRO TAKE 1 TABLET DAILY   naproxen 500 MG tablet Commonly known as:  NAPROSYN Take 500 mg by mouth 2 (two) times daily with a meal.   oxyCODONE-acetaminophen 5-325 MG tablet Commonly known as:  PERCOCET/ROXICET Take 1-2 tablets by mouth every 6 (six) hours as needed for severe pain (sedation caution).   pravastatin 20 MG tablet Commonly known as:  PRAVACHOL Take 1 tablet (20 mg total) by mouth every evening.        PAST MEDICAL HISTORY:   Past Medical History:  Diagnosis Date  . Arthritis   . Bradycardia   . Cancer [facility]) 2013   skin cancer  . Depression    ptsd  . Dysrhythmia    chronic slow heart rate  . GERD (  gastroesophageal reflux disease)   . Headache(784.0)    tension headaches non recent  . History of chicken pox   . History of kidney stones   . Hypertension    treated with HCTZ  . Parkinson's disease ([place])    dx'ed 15 years ago  . PTSD (post-traumatic stress disorder)   . Shortness of breath dyspnea   . Sleep apnea    doesn't use C-pap  . Varicose veins     PAST SURGICAL HISTORY:   Past Surgical History:  Procedure Laterality Date  . CHOLECYSTECTOMY N/A 10/22/2014   Procedure: LAPAROSCOPIC CHOLECYSTECTOMY WITH INTRAOPERATIVE CHOLANGIOGRAM;  Surgeon: Dia Crawford III, MD;  Location: [facility] ORS;  Service: General;  Laterality: N/A;  . cyst removed      from lip as a child  . LUMBAR  LAMINECTOMY/DECOMPRESSION MICRODISCECTOMY Bilateral 12/14/2012   Procedure: Bilateral lumbar three-four, four-five decompressive laminotomy/foraminotomy;  Surgeon: Charlie Pitter, MD;  Location: [place] NEURO ORS;  Service: Neurosurgery;  Laterality: Bilateral;  . PULSE GENERATOR IMPLANT Bilateral 12/13/2013   Procedure: Bilateral implantable pulse generator placement;  Surgeon: Erline Levine, MD;  Location: [place] NEURO ORS;  Service: Neurosurgery;  Laterality: Bilateral;  Bilateral implantable pulse generator placement  . skin cancer removed     from ears,   12 lft arm  rt leg 15  . SUBTHALAMIC STIMULATOR INSERTION Bilateral 12/06/2013   Procedure: SUBTHALAMIC STIMULATOR INSERTION;  Surgeon: Erline Levine, MD;  Location: [place] NEURO ORS;  Service: Neurosurgery;  Laterality: Bilateral;  Bilateral deep brain stimulator placement    SOCIAL HISTORY:   Social History   Social History  . Marital status: Married    Spouse name: N/A  . Number of children: N/A  . Years of education: N/A   Occupational History  . disabled     2001, [employer]  .      search and rescue helicopter   Social History Main Topics  . Smoking status: Never Smoker  . Smokeless tobacco: Former Systems developer  . Alcohol use 0.0 oz/week     Comment: occasional (twice per month)  . Drug use: No  . Sexual activity: No   Other Topics Concern  . 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    Married 2014    FAMILY HISTORY:   Family Status  Relation Status  . Mother Alive   Arthritis, unknown medical hx  . Father Deceased   Lung Cancer  . Sister Alive   1 and one half sister  . Brother Alive  . Son Alive  . Daughter Alive   2  . Neg Hx     ROS:  A complete 10 system review of systems was obtained and was unremarkable apart from what is mentioned above.  PHYSICAL EXAMINATION:    VITALS:   Vitals:   05/31/16 0742  BP: 114/64  Pulse: (!) 42    Weight: 181 lb (82.1 kg)  Height: 5\' 9"  (1.753 m)   Wt Readings from Last 3 Encounters:  05/31/16 181 lb (82.1 kg)  03/02/16 183 lb 12.8 oz (83.4 kg)  02/11/16 183 lb (83 kg)     GEN:  The patient appears stated age and is in NAD.   Affect more flat today. HEENT:  Normocephalic, atraumatic.  The mucous membranes are moist. The superficial temporal arteries are without ropiness or tenderness. Cardiovascular: bradycardic.  Regular rhythym. Lungs:  Clear to auscultation bilaterally   Neurological examination:  Orientation: The patient is alert and oriented x3.  He is awake  Cranial nerves: There is good facial symmetry.  The speech is fluent but dysarthric.    Soft palate rises symmetrically and there is no tongue deviation. Hearing is intact to conversational tone. Sensation: Sensation is intact to light touch throughout. Motor: Strength is 5/5 in the bilateral upper and lower extremities.   Shoulder shrug is equal and symmetric.  There is no pronator drift. Deep tendon reflexes: Deferred today.  Movement examination: Tone: There is normal tone today Abnormal movements: There is no tremor and no dyskinesia today. Coordination: There is good RAMs today Gait and Station: The patient has no difficulty arising out of a deep-seated chair without the use of the hands. The patient's stride length is good.  Is not dragging the leg. Walks well with the U step  DBS programming was performed today which is described in more detail on a separate programming procedural notes.   ASSESSMENT/PLAN:  1.  Parkinsons disease by hx. He has been seeing Dr. Jennelle Human through the [facility].    -The patient is status post bilateral STN DBS on 12/06/2013 with battery placement on 12/13/2013.  -off of mirapex  -continue carbidopa/levodopa 25/100, 2/1/1.    -he is off of selegiline.    -use walker at all times.  Never falls with it but has multiple times without it. 2.  LBP.   -Status  post repeat L4-L5 laminotomy with foraminotomy and L1-L2 laminectomy on 06/13/2014.  Back pain has returned and he is to have CT.  Reminded him that he can have specialized MRI but patient resistant b/c doesn't want to have DBS turned off to have done and would then have tremor and would likely need sedated 3.  Dysphagia  - He did have a modified barium swallow on 08/07/2015.  It demonstrated mild oral pharyngeal dysphagia (an improvement from 2015 examination) and a regular diet with thin liquids was recommended.  4.  OSAS with excessive daytime hypersomnolence.  -We talked about morbidity and mortality associated with untreated sleep apnea.    He was not able to tolerate the cpap despite trying multiple masks.   Sleeping for hours in day but still sleeping well at night.  5.  Depression  -on Wellbutrin XL, 450 mg and lexapro and doing better.  -felt near suicidal on klonopin 6.  Early Hallucinations  -arising out of sleep but he is awake when they happen  -We discussed Nuplazid.  We discussed its risks and benefits.  Discussed that it is the only medication formally approved for the treatment of Parkinsons related psychosis/hallucinations.  Discussed in detail the black box warning on this medication and what that means and pt/family expressed understanding and agreed that benefits outweigh risks in this case.  his QTc interval was normal on last EKG.   6.  RBD  -Still acting out the dreams some, but felt almost near suicidal on clonazepam.  Bedroom safety discussed 7. Bradycardia  -cardiology following.  Pulse in the 40's today and may be etiology for fatigue. 8.  Follow-up in the next 5-6 months, sooner should new neurologic issues arise.  Much greater than 50% of this visit was spent in counseling with the patient and the family.  Total face to face time:  25 min not including programming time

## 2016-05-30 ENCOUNTER — Ambulatory Visit: Payer: Medicare PPO | Admitting: Rehabilitative and Restorative Service Providers"

## 2016-05-30 ENCOUNTER — Ambulatory Visit: Payer: Medicare PPO

## 2016-05-30 ENCOUNTER — Ambulatory Visit: Payer: Medicare PPO | Admitting: Occupational Therapy

## 2016-05-30 DIAGNOSIS — R41841 Cognitive communication deficit: Secondary | ICD-10-CM

## 2016-05-30 DIAGNOSIS — R2689 Other abnormalities of gait and mobility: Secondary | ICD-10-CM

## 2016-05-30 DIAGNOSIS — R29818 Other symptoms and signs involving the nervous system: Secondary | ICD-10-CM | POA: Diagnosis not present

## 2016-05-30 DIAGNOSIS — R29898 Other symptoms and signs involving the musculoskeletal system: Secondary | ICD-10-CM

## 2016-05-30 DIAGNOSIS — R278 Other lack of coordination: Secondary | ICD-10-CM

## 2016-05-30 DIAGNOSIS — R293 Abnormal posture: Secondary | ICD-10-CM | POA: Diagnosis not present

## 2016-05-30 DIAGNOSIS — M6281 Muscle weakness (generalized): Secondary | ICD-10-CM | POA: Diagnosis not present

## 2016-05-30 DIAGNOSIS — R471 Dysarthria and anarthria: Secondary | ICD-10-CM

## 2016-05-30 DIAGNOSIS — R1312 Dysphagia, oropharyngeal phase: Secondary | ICD-10-CM

## 2016-05-30 DIAGNOSIS — R2681 Unsteadiness on feet: Secondary | ICD-10-CM | POA: Diagnosis not present

## 2016-05-30 DIAGNOSIS — R41844 Frontal lobe and executive function deficit: Secondary | ICD-10-CM | POA: Diagnosis not present

## 2016-05-30 NOTE — Therapy (Signed)
[facility] [address] [place], [place], [address] Phone: [phone]   Fax:  [phone]  Speech Language Pathology Treatment  Patient Details  Name: [name] MRN: [id number] Date of Birth: 1951/02/24 Referring Provider: Alonza Bogus, DO  Encounter Date: 05/30/2016      End of Session - 05/30/16 1235    Visit Number 4   Number of Visits 17   Date for SLP Re-Evaluation 07/22/16   SLP Start Time 1146   SLP Stop Time  1230   SLP Time Calculation (min) 44 min   Activity Tolerance Patient tolerated treatment well      Past Medical History:  Diagnosis Date  . Arthritis   . Bradycardia   . Cancer [facility]) 2013   skin cancer  . Depression    ptsd  . Dysrhythmia    chronic slow heart rate  . GERD (gastroesophageal reflux disease)   . Headache(784.0)    tension headaches non recent  . History of chicken pox   . History of kidney stones   . Hypertension    treated with HCTZ  . Parkinson's disease ([place])    dx'ed 15 years ago  . PTSD (post-traumatic stress disorder)   . Shortness of breath dyspnea   . Sleep apnea    doesn't use C-pap  . Varicose veins     Past Surgical History:  Procedure Laterality Date  . CHOLECYSTECTOMY N/A 10/22/2014   Procedure: LAPAROSCOPIC CHOLECYSTECTOMY WITH INTRAOPERATIVE CHOLANGIOGRAM;  Surgeon: Dia Crawford III, MD;  Location: [facility] ORS;  Service: General;  Laterality: N/A;  . cyst removed      from lip as a child  . LUMBAR LAMINECTOMY/DECOMPRESSION MICRODISCECTOMY Bilateral 12/14/2012   Procedure: Bilateral lumbar three-four, four-five decompressive laminotomy/foraminotomy;  Surgeon: Charlie Pitter, MD;  Location: [place] NEURO ORS;  Service: Neurosurgery;  Laterality: Bilateral;  . PULSE GENERATOR IMPLANT Bilateral 12/13/2013   Procedure: Bilateral implantable pulse generator placement;  Surgeon: Erline Levine, MD;  Location: [place] NEURO ORS;  Service: Neurosurgery;  Laterality: Bilateral;   Bilateral implantable pulse generator placement  . skin cancer removed     from ears,   12 lft arm  rt leg 15  . SUBTHALAMIC STIMULATOR INSERTION Bilateral 12/06/2013   Procedure: SUBTHALAMIC STIMULATOR INSERTION;  Surgeon: Erline Levine, MD;  Location: [place] NEURO ORS;  Service: Neurosurgery;  Laterality: Bilateral;  Bilateral deep brain stimulator placement    There were no vitals filed for this visit.      Subjective Assessment - 05/30/16 1153    Subjective Pt fell forward from edge of bed onto windowsill - with bruise on forehead just lateral and above lt eye socket.   Patient is accompained by: --  wife               ADULT SLP TREATMENT - 05/30/16 1155      General Information   Behavior/Cognition Alert;Cooperative;Pleasant mood     Treatment Provided   Treatment provided Cognitive-Linquistic     Pain Assessment   Pain Assessment 0-10   Pain Score 4    Pain Location back   Pain Descriptors / Indicators Sore  like a golfball in the middle of my back   Pain Intervention(s) Monitored during session     Cognitive-Linquistic Treatment   Treatment focused on Dysarthria   Skilled Treatment SLP attempted to cue pt to slow rate of speech with 6 minutes of conversation but SLP verbal question cues for repeat did not change pt's speech  intelligibilty. In word responses, pt was 95% intelligible. In reading sentences, pt req'd SLP min mod cues usually to slow down, feel every sound, put space between the words. SLP played back recording of pt's responses to incr awareness. In sentence responses pt req'd SLP min-mod cues occasionally for functionally clear speech.     Assessment / Recommendations / Plan   Plan Continue with current plan of care     Progression Toward Goals   Progression toward goals Progressing toward goals          SLP Education - 05/30/16 1234    Education provided Yes   Education Details What pt's speech sounds like when he speaks unintelligibly    Person(s) Educated Patient;Spouse   Methods Explanation;Other (comment)  digital recording   Comprehension Verbalized understanding;Returned demonstration          SLP Short Term Goals - 05/30/16 1236      SLP SHORT TERM GOAL #1   Title pt will maintain speech volume appropriate for 95-100% intelligibilty in 7 minutes simple conversation with rare min A   Time 2   Period Weeks   Status On-going     SLP SHORT TERM GOAL #2   Title pt will demo emergent awareness re: reduced speech loudness in conversation, by repeating hjimself spontaneously 70% of opportunities   Time 2   Period Weeks   Status On-going     SLP SHORT TERM GOAL #3   Title pt will demo knowledge of memory compensations during 3 therapy sessions   Time 2   Period Weeks   Status On-going     SLP SHORT TERM GOAL #4   Title pt will perform dysphagia HEP with occasional min cues over three sessions    Time 2   Period Weeks   Status On-going          SLP Long Term Goals - 05/30/16 1237      SLP LONG TERM GOAL #1   Title pt will maintain speech volume adequate to achieve at least 95% intelligibility in a noisy environment over 10 minutes of simple to mod complex conversation over three sessions   Time 66   Period Weeks   Status On-going     SLP LONG TERM GOAL #2   Title pt will demo emergent awareness of low speech volume in 10 minutes conversation by self-repeating in 75% of opportunities   Time 6   Period Weeks   Status On-going     SLP LONG TERM GOAL #3   Title pt will complete dysphagia HEP with rare min cues over three sessions   Time 6   Period Weeks   Status On-going          Plan - 05/30/16 1235    Clinical Impression Statement Today pt demonstrated reduced communicative effectiveness due to decr'd speech loudness and rushes of speech with reduced awareness. By session end, pt awareness had improved as he wa more regularly attempting to use compensations for speech intelligiblity. Skilled  ST remains needed to improve pt's speech clarity as well as strengthen his swallowing  musculature.   Speech Therapy Frequency 2x / week   Duration --  8 weeks   Treatment/Interventions Compensatory techniques;Cognitive reorganization;SLP instruction and feedback;Internal/external aids;Functional tasks;Patient/family education;Cueing hierarchy   Potential to Achieve Goals Good   Potential Considerations Ability to learn/carryover information;Cooperation/participation level;Severity of impairments   SLP Home Exercise Plan provided today   Consulted and Agree with Plan of Care Patient  Patient will benefit from skilled therapeutic intervention in order to improve the following deficits and impairments:   Dysarthria and anarthria  Cognitive communication deficit  Dysphagia, oropharyngeal    Problem List Patient Active Problem List   Diagnosis Date Noted  . Trochanteric bursitis 03/03/2016  . Left hand pain 10/30/2015  . Fracture, finger, multiple sites 10/30/2015  . Colon cancer screening 07/23/2015  . Screening examination for infectious disease 07/23/2015  . Gout 02/18/2015  . Depression 01/06/2015  . Irritation of eyelid 08/20/2014  . Dupuytren's contracture 08/20/2014  . Cough 07/21/2014  . Lumbar stenosis with neurogenic claudication 06/13/2014  . Preop cardiovascular exam 05/28/2014  . S/P deep brain stimulator placement 05/08/2014  . PD (Parkinson's disease) ([place]) 05/08/2014  . Joint pain 01/22/2014  . Medicare annual wellness visit, initial 01/19/2014  . Advance care planning 01/19/2014  . Parkinson's disease ([place]) 12/13/2013  . PTSD (post-traumatic stress disorder) 06/13/2013  . Erectile dysfunction 06/13/2013  . HLD (hyperlipidemia) 06/13/2013  . Hip pain 06/10/2013  . Pain in joint, shoulder region 06/10/2013  . Right leg swelling 06/03/2013  . Essential hypertension 06/03/2013  . Bradycardia by electrocardiogram 06/03/2013  . Obstructive sleep apnea  03/12/2013  . Spinal stenosis, lumbar region, with neurogenic claudication 12/14/2012    [facility] ,[place], CCC-SLP  05/30/2016, 12:37 PM  [facility] [address] [place], [place], [address] Phone: [phone]   Fax:  [phone]   Name: JULIO STEPHANI MRN: [id number] Date of Birth: 10-31-1950
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
## 2016-05-30 NOTE — Therapy (Signed)
[facility] [address] [place], [place], [address] Phone: [phone]   Fax:  [phone]  Occupational Therapy Treatment  Patient Details  Name: [name] MRN: [id number] Date of Birth: [birth year] Referring Provider: Dr. Wells Guiles Tat  Encounter Date: 05/30/2016      OT End of Session - 05/30/16 1111    Visit Number 6   Number of Visits 17   Date for OT Re-Evaluation 07/07/16   Authorization Type [employer] Medicare / T[employer], no auth/visit limit, G-code needed   Authorization - Visit Number 6   Authorization - Number of Visits 10   OT Start Time 1102   OT Stop Time 1145   OT Time Calculation (min) 43 min   Activity Tolerance Patient tolerated treatment well   Behavior During Therapy [facility] for tasks assessed/performed      Past Medical History:  Diagnosis Date  . Arthritis   . Bradycardia   . Cancer [facility]) 2013   skin cancer  . Depression    ptsd  . Dysrhythmia    chronic slow heart rate  . GERD (gastroesophageal reflux disease)   . Headache(784.0)    tension headaches non recent  . History of chicken pox   . History of kidney stones   . Hypertension    treated with HCTZ  . Parkinson's disease ([place])    dx'ed 15 years ago  . PTSD (post-traumatic stress disorder)   . Shortness of breath dyspnea   . Sleep apnea    doesn't use C-pap  . Varicose veins     Past Surgical History:  Procedure Laterality Date  . CHOLECYSTECTOMY N/A 10/22/2014   Procedure: LAPAROSCOPIC CHOLECYSTECTOMY WITH INTRAOPERATIVE CHOLANGIOGRAM;  Surgeon: Dia Crawford III, MD;  Location: [facility] ORS;  Service: General;  Laterality: N/A;  . cyst removed      from lip as a child  . LUMBAR LAMINECTOMY/DECOMPRESSION MICRODISCECTOMY Bilateral 12/14/2012   Procedure: Bilateral lumbar three-four, four-five decompressive laminotomy/foraminotomy;  Surgeon: Charlie Pitter, MD;  Location: [place] NEURO ORS;  Service: Neurosurgery;  Laterality: Bilateral;  .  PULSE GENERATOR IMPLANT Bilateral 12/13/2013   Procedure: Bilateral implantable pulse generator placement;  Surgeon: Erline Levine, MD;  Location: [place] NEURO ORS;  Service: Neurosurgery;  Laterality: Bilateral;  Bilateral implantable pulse generator placement  . skin cancer removed     from ears,   12 lft arm  rt leg 15  . SUBTHALAMIC STIMULATOR INSERTION Bilateral 12/06/2013   Procedure: SUBTHALAMIC STIMULATOR INSERTION;  Surgeon: Erline Levine, MD;  Location: [place] NEURO ORS;  Service: Neurosurgery;  Laterality: Bilateral;  Bilateral deep brain stimulator placement    There were no vitals filed for this visit.      Subjective Assessment - 05/30/16 1108    Subjective  Pt reports 2 falls from lounge chair   Pertinent History Parkinson's diagnosis approx 17 yrs ago, hx of 3 back surgeries (last approx 2 yrs ago), bilateral DBS placement approx 3 yrs ago, multiple falls, R 3rd digit fx 10/2015, HTN, bradycardia, PTSD, lumbar stenosis, HDL, Dupuytren contracture, depression, gout    Limitations Bilateral Deep Brain Stimulator; Fall risk   Patient Stated Goals safety per wife   Currently in Pain? No/denies   Pain Score 4    Pain Location --  between shoulder blade   Pain Orientation Mid   Pain Descriptors / Indicators Tightness;Sore   Pain Type Chronic pain   Pain Onset --  unknown   Pain Frequency Constant   Aggravating Factors  scapular retraction   Pain Relieving Factors unknown       Functional step and reach forward/back with functional reach for improve wt. Shift for ADLs with focus/mod cueing (verbal/tactile) for upright posture/avoiding hyperextension of trunk and controlled wt. Shift and CGA for balance.  Performed with each UE/LE.  Pt with LOB x2 with min A to recover x1 and mod A x1.  Arm bike x80min level 1 for reciprocal movement (forward/backwards) with cues/target of at least 40rpms for intensity while maintaining movement amplitude/reciprocal movement.   Pt maintained 40+rpms.   Also min cues to avoid head/trunk ext and towel roll placed behind back for improved posture.  Educated pt/wife in visual changes that may affect balance/functional mobility (difficulty coordinating eyes to use bifocals and decr contrast sensitivity).  Pt tends to hyperext head/trunk and reports that he has to tilt head due to glasses at times.  Discussed that pt may benefit from separate reading glasses vs. Bifocals.  Recommended pt/wife discuss options with eye doctor.  Standing with CGA to pick up various objects from floor with reacher with min cueing for improved safety.  Recommended pt velcro reacher to U-step and use reaching instead of reaching to the floor.  Alternatively, pt could sit prior to bending over to pick up objects.  Pt/wife verbalized understanding.  Pt/wife reports that he has applied for disability through the [place] for PD.                        OT Short Term Goals - 05/10/16 0939      OT SHORT TERM GOAL #1   Title Pt/caregiver will be independent with PD-specific HEP.--check STGs 06/09/16   Time 4   Period Weeks   Status New     OT SHORT TERM GOAL #2   Title Pt will improve coordination for ADLs as shown by improving time on 9-hole peg test by at least 5sec with dominant RUE.   Baseline 60.56sec   Time 4   Period Weeks   Status New     OT SHORT TERM GOAL #3   Title Pt will improve coordination/functional reaching for ADLs as shown by improving box and blocks score by at least 4 bilaterally.   Baseline R-29, L-33 blocks   Time 4   Period Weeks   Status New     OT SHORT TERM GOAL #4   Title Pt will incr ease with eating as shown by completing PPT#2 in 15sec or less.   Baseline 17.53sec   Time 4   Period Weeks   Status New     OT SHORT TERM GOAL #5   Title Pt/caregiver will verbalize ways to prevent future complications and appropriate community resources prn.   Time 4   Period Weeks   Status New           OT Long Term Goals -  05/10/16 0944      OT LONG TERM GOAL #1   Title Pt/caregiver will verbalize understanding of adaptive strategies/equipment to incr ease/safety/independence with ADLs/IADLs.--check LTGs 07/07/16   Baseline -----   Time 8   Period Weeks   Status New     OT LONG TERM GOAL #2   Title Pt will improve coordination for ADLs as shown by improving time on 9-hole peg test by at least 10sec with dominant RUE.   Baseline 60.56sec   Time 8   Period Weeks   Status New     OT LONG TERM  GOAL #3   Title Pt will improve coordination/functional reaching for ADLs as shown by improving box and blocks score by at least 8 with RUE.   Baseline R-29 blocks   Time 8   Period Weeks   Status New     OT LONG TERM GOAL #4   Title Pt will fasten/unfasten 3 buttons in less than 45sec for incr ease with dressing.   Baseline 53.0sec   Time 8   Period Weeks   Status New     OT LONG TERM GOAL #5   Title Pt will improve balance for ADLs/IADLs as shown by improving standing functional reach test by at least 2 inches with LUE.   Baseline R-8", L-6"   Time 8   Period Weeks   Status New     OT LONG TERM GOAL #6   Title ---------   Baseline -------     OT LONG TERM GOAL #7   Title -----   Baseline ------               Plan - 05/30/16 1700    Clinical Impression Statement Pt continues to need cueing for incr safety awareness and use of strategies for incr safety.  Pt will continue to need reinforcement.   Rehab Potential Fair   Clinical Impairments Affecting Rehab Potential cognitive deficits, severity of deficits, hip pain   OT Frequency 2x / week   OT Duration 8 weeks   OT Treatment/Interventions Self-care/ADL training;Moist Heat;Cryotherapy;Therapeutic exercise;Parrafin;DME and/or AE instruction;Therapist, nutritional;Therapeutic activities;Patient/family education;Balance training;Cognitive remediation/compensation;Splinting;Manual Therapy;Neuromuscular  education;Ultrasound;Fluidtherapy;Energy conservation;Passive range of motion;Therapeutic exercises   Plan continue to address strategies to incr safety with ADLs   OT Home Exercise Plan PWR! supine, basic coordination(cards only), PWR! up seated   Consulted and Agree with Plan of Care Patient      Patient will benefit from skilled therapeutic intervention in order to improve the following deficits and impairments:  Decreased mobility, Decreased balance, Abnormal gait, Pain, Impaired tone, Impaired perceived functional ability, Decreased knowledge of precautions, Decreased coordination, Decreased activity tolerance, Decreased cognition, Decreased knowledge of use of DME, Decreased safety awareness, Decreased strength, Impaired UE functional use, Improper spinal/pelvic alignment, Improper body mechanics  Visit Diagnosis: Other symptoms and signs involving the nervous system  Other symptoms and signs involving the musculoskeletal system  Abnormal posture  Unsteadiness on feet  Other abnormalities of gait and mobility  Other lack of coordination    Problem List Patient Active Problem List   Diagnosis Date Noted  . Trochanteric bursitis 03/03/2016  . Left hand pain 10/30/2015  . Fracture, finger, multiple sites 10/30/2015  . Colon cancer screening 07/23/2015  . Screening examination for infectious disease 07/23/2015  . Gout 02/18/2015  . Depression 01/06/2015  . Irritation of eyelid 08/20/2014  . Dupuytren's contracture 08/20/2014  . Cough 07/21/2014  . Lumbar stenosis with neurogenic claudication 06/13/2014  . Preop cardiovascular exam 05/28/2014  . S/P deep brain stimulator placement 05/08/2014  . PD (Parkinson's disease) ([place]) 05/08/2014  . Joint pain 01/22/2014  . Medicare annual wellness visit, initial 01/19/2014  . Advance care planning 01/19/2014  . Parkinson's disease ([place]) 12/13/2013  . PTSD (post-traumatic stress disorder) 06/13/2013  . Erectile dysfunction  06/13/2013  . HLD (hyperlipidemia) 06/13/2013  . Hip pain 06/10/2013  . Pain in joint, shoulder region 06/10/2013  . Right leg swelling 06/03/2013  . Essential hypertension 06/03/2013  . Bradycardia by electrocardiogram 06/03/2013  . Obstructive sleep apnea 03/12/2013  . Spinal stenosis, lumbar region, with neurogenic  claudication 12/14/2012    [facility] 05/30/2016, 5:02 PM  [facility] [address] [place], [place], [address] Phone: [phone]   Fax:  [phone]  Name: [name] MRN: [id number] Date of Birth: October 12, 1950   Vianne Bulls, OTR/L [facility] [address] [place], [place]  [address] [phone] phone [phone] 05/30/16 5:04 PM

## 2016-05-30 NOTE — Patient Instructions (Signed)
  Please complete the assigned speech therapy homework and return it to your next session.  

## 2016-05-31 ENCOUNTER — Ambulatory Visit (INDEPENDENT_AMBULATORY_CARE_PROVIDER_SITE_OTHER): Payer: Medicare PPO | Admitting: Neurology

## 2016-05-31 ENCOUNTER — Encounter: Payer: Self-pay | Admitting: Neurology

## 2016-05-31 VITALS — BP 114/64 | HR 42 | Ht 69.0 in | Wt 181.0 lb

## 2016-05-31 DIAGNOSIS — R001 Bradycardia, unspecified: Secondary | ICD-10-CM

## 2016-05-31 DIAGNOSIS — G20 Parkinson's disease: Secondary | ICD-10-CM | POA: Diagnosis not present

## 2016-05-31 DIAGNOSIS — R441 Visual hallucinations: Secondary | ICD-10-CM | POA: Diagnosis not present

## 2016-05-31 DIAGNOSIS — G20A1 Parkinson's disease without dyskinesia, without mention of fluctuations: Secondary | ICD-10-CM

## 2016-05-31 DIAGNOSIS — Z9689 Presence of other specified functional implants: Secondary | ICD-10-CM | POA: Diagnosis not present

## 2016-05-31 MED ORDER — PIMAVANSERIN TARTRATE 17 MG PO TABS
2.0000 | ORAL_TABLET | Freq: Every day | ORAL | 0 refills | Status: DC
Start: 2016-05-31 — End: 2016-07-25

## 2016-05-31 NOTE — Addendum Note (Signed)
Addended by: Annamaria Helling on: 05/31/2016 08:38 AM   Modules accepted: Orders

## 2016-05-31 NOTE — Procedures (Signed)
DBS Programming was performed.    Total time spent programming was 20 minutes.  Device was confirmed to be on.  Soft start was confirmed to be on at 8.  Impedences were checked and were within normal limits.  Battery was checked and was determined to be functioning normally and not near the end of life.   This was all documented on a separate neurophysiologic worksheet.  Final settings were as follows:  Left brain electrode:     1-C+           ; Amplitude  4.0   V   ; Pulse width 90 microseconds;   Frequency   140   Hz.  Right brain electrode:     2-C+          ; Amplitude   4.3  V ;  Pulse width 60  microseconds;  Frequency   130    Hz.

## 2016-06-01 ENCOUNTER — Ambulatory Visit: Payer: Medicare PPO | Admitting: Occupational Therapy

## 2016-06-01 ENCOUNTER — Ambulatory Visit: Payer: Medicare PPO

## 2016-06-01 ENCOUNTER — Ambulatory Visit: Payer: Medicare PPO | Admitting: Physical Therapy

## 2016-06-01 DIAGNOSIS — R2689 Other abnormalities of gait and mobility: Secondary | ICD-10-CM | POA: Diagnosis not present

## 2016-06-01 DIAGNOSIS — R41841 Cognitive communication deficit: Secondary | ICD-10-CM | POA: Diagnosis not present

## 2016-06-01 DIAGNOSIS — R1312 Dysphagia, oropharyngeal phase: Secondary | ICD-10-CM

## 2016-06-01 DIAGNOSIS — R293 Abnormal posture: Secondary | ICD-10-CM | POA: Diagnosis not present

## 2016-06-01 DIAGNOSIS — R2681 Unsteadiness on feet: Secondary | ICD-10-CM

## 2016-06-01 DIAGNOSIS — R29818 Other symptoms and signs involving the nervous system: Secondary | ICD-10-CM | POA: Diagnosis not present

## 2016-06-01 DIAGNOSIS — R471 Dysarthria and anarthria: Secondary | ICD-10-CM

## 2016-06-01 DIAGNOSIS — R278 Other lack of coordination: Secondary | ICD-10-CM | POA: Diagnosis not present

## 2016-06-01 DIAGNOSIS — R41844 Frontal lobe and executive function deficit: Secondary | ICD-10-CM | POA: Diagnosis not present

## 2016-06-01 DIAGNOSIS — R29898 Other symptoms and signs involving the musculoskeletal system: Secondary | ICD-10-CM

## 2016-06-01 DIAGNOSIS — M6281 Muscle weakness (generalized): Secondary | ICD-10-CM | POA: Diagnosis not present

## 2016-06-01 NOTE — Therapy (Signed)
[facility] [address] [place], [place], [address] Phone: [phone]   Fax:  [phone]  Occupational Therapy Treatment  Patient Details  Name: [name] MRN: [id number] Date of Birth: [birth year] Referring Provider: Dr. Wells Guiles Tat  Encounter Date: 06/01/2016      OT End of Session - 06/01/16 1242    Visit Number 7   Number of Visits 17   Date for OT Re-Evaluation 07/07/16   Authorization Type [employer] Medicare / T[employer], no auth/visit limit, G-code needed   Authorization - Visit Number 7   Authorization - Number of Visits 10   OT Start Time 1020   OT Stop Time 1100   OT Time Calculation (min) 40 min   Activity Tolerance Patient tolerated treatment well   Behavior During Therapy [facility] for tasks assessed/performed      Past Medical History:  Diagnosis Date  . Arthritis   . Bradycardia   . Cancer [facility]) 2013   skin cancer  . Depression    ptsd  . Dysrhythmia    chronic slow heart rate  . GERD (gastroesophageal reflux disease)   . Headache(784.0)    tension headaches non recent  . History of chicken pox   . History of kidney stones   . Hypertension    treated with HCTZ  . Parkinson's disease ([place])    dx'ed 15 years ago  . PTSD (post-traumatic stress disorder)   . Shortness of breath dyspnea   . Sleep apnea    doesn't use C-pap  . Varicose veins     Past Surgical History:  Procedure Laterality Date  . CHOLECYSTECTOMY N/A 10/22/2014   Procedure: LAPAROSCOPIC CHOLECYSTECTOMY WITH INTRAOPERATIVE CHOLANGIOGRAM;  Surgeon: Dia Crawford III, MD;  Location: [facility] ORS;  Service: General;  Laterality: N/A;  . cyst removed      from lip as a child  . LUMBAR LAMINECTOMY/DECOMPRESSION MICRODISCECTOMY Bilateral 12/14/2012   Procedure: Bilateral lumbar three-four, four-five decompressive laminotomy/foraminotomy;  Surgeon: Charlie Pitter, MD;  Location: [place] NEURO ORS;  Service: Neurosurgery;  Laterality: Bilateral;  .  PULSE GENERATOR IMPLANT Bilateral 12/13/2013   Procedure: Bilateral implantable pulse generator placement;  Surgeon: Erline Levine, MD;  Location: [place] NEURO ORS;  Service: Neurosurgery;  Laterality: Bilateral;  Bilateral implantable pulse generator placement  . skin cancer removed     from ears,   12 lft arm  rt leg 15  . SUBTHALAMIC STIMULATOR INSERTION Bilateral 12/06/2013   Procedure: SUBTHALAMIC STIMULATOR INSERTION;  Surgeon: Erline Levine, MD;  Location: [place] NEURO ORS;  Service: Neurosurgery;  Laterality: Bilateral;  Bilateral deep brain stimulator placement    There were no vitals filed for this visit.      Education regarding strategies for feeding and cutting food with adapted technique and big movements. Pt encouraged to choke down on utensil for better grasp. Reinforced importance of walker use to minimize falls. [name] asked about rock steady boxing, as MD had mentioned to pt. Therapist discussed with pt and wife that pt would not be a good candidate due to hx of shoulder pain and decreased balance.  Functional step and reach forward/back with functional reach for improve wt. Shift for ADLs with focus/mod cueing (verbal/tactile) for upright posture/avoiding hyperextension of trunk and controlled wt. Shift and CGA for balance.  Performed with each UE/LE reaching to place large pegs in vertical pegboard  Arm bike x10min level 1 for reciprocal movement (forward/backwards) with cues/target of at least 40rpms for intensity while maintaining movement  amplitude/reciprocal movement.   Pt maintained 40+rpms.  Also min cues to avoid head/trunk ext and towel roll placed behind back for improved posture.                       OT Short Term Goals - 05/10/16 0939      OT SHORT TERM GOAL #1   Title Pt/caregiver will be independent with PD-specific HEP.--check STGs 06/09/16   Time 4   Period Weeks   Status New     OT SHORT TERM GOAL #2   Title Pt will improve coordination for  ADLs as shown by improving time on 9-hole peg test by at least 5sec with dominant RUE.   Baseline 60.56sec   Time 4   Period Weeks   Status New     OT SHORT TERM GOAL #3   Title Pt will improve coordination/functional reaching for ADLs as shown by improving box and blocks score by at least 4 bilaterally.   Baseline R-29, L-33 blocks   Time 4   Period Weeks   Status New     OT SHORT TERM GOAL #4   Title Pt will incr ease with eating as shown by completing PPT#2 in 15sec or less.   Baseline 17.53sec   Time 4   Period Weeks   Status New     OT SHORT TERM GOAL #5   Title Pt/caregiver will verbalize ways to prevent future complications and appropriate community resources prn.   Time 4   Period Weeks   Status New           OT Long Term Goals - 05/10/16 0944      OT LONG TERM GOAL #1   Title Pt/caregiver will verbalize understanding of adaptive strategies/equipment to incr ease/safety/independence with ADLs/IADLs.--check LTGs 07/07/16   Baseline -----   Time 8   Period Weeks   Status New     OT LONG TERM GOAL #2   Title Pt will improve coordination for ADLs as shown by improving time on 9-hole peg test by at least 10sec with dominant RUE.   Baseline 60.56sec   Time 8   Period Weeks   Status New     OT LONG TERM GOAL #3   Title Pt will improve coordination/functional reaching for ADLs as shown by improving box and blocks score by at least 8 with RUE.   Baseline R-29 blocks   Time 8   Period Weeks   Status New     OT LONG TERM GOAL #4   Title Pt will fasten/unfasten 3 buttons in less than 45sec for incr ease with dressing.   Baseline 53.0sec   Time 8   Period Weeks   Status New     OT LONG TERM GOAL #5   Title Pt will improve balance for ADLs/IADLs as shown by improving standing functional reach test by at least 2 inches with LUE.   Baseline R-8", L-6"   Time 8   Period Weeks   Status New     OT LONG TERM GOAL #6   Title ---------   Baseline -------      OT LONG TERM GOAL #7   Title -----   Baseline ------               Plan - 06/01/16 1240    Clinical Impression Statement Pt is progressing towards goals. He demonstrates improved safety with mobility when using the u-step walker today. Pt was encouraged to  use at home.   Rehab Potential Fair   Clinical Impairments Affecting Rehab Potential cognitive deficits, severity of deficits, hip pain   OT Frequency 2x / week   OT Duration 8 weeks   OT Treatment/Interventions Self-care/ADL training;Moist Heat;Cryotherapy;Therapeutic exercise;Parrafin;DME and/or AE instruction;Therapist, nutritional;Therapeutic activities;Patient/family education;Balance training;Cognitive remediation/compensation;Splinting;Manual Therapy;Neuromuscular education;Ultrasound;Fluidtherapy;Energy conservation;Passive range of motion;Therapeutic exercises   Plan check short term goals next week   OT [place]! supine, basic coordination(cards only), PWR! up seated   Consulted and Agree with Plan of Care Patient;Family member/caregiver   Family Member Consulted wife      Patient will benefit from skilled therapeutic intervention in order to improve the following deficits and impairments:  Decreased mobility, Decreased balance, Abnormal gait, Pain, Impaired tone, Impaired perceived functional ability, Decreased knowledge of precautions, Decreased coordination, Decreased activity tolerance, Decreased cognition, Decreased knowledge of use of DME, Decreased safety awareness, Decreased strength, Impaired UE functional use, Improper spinal/pelvic alignment, Improper body mechanics  Visit Diagnosis: Other symptoms and signs involving the nervous system  Other symptoms and signs involving the musculoskeletal system  Abnormal posture  Other lack of coordination    Problem List Patient Active Problem List   Diagnosis Date Noted  . Trochanteric bursitis 03/03/2016  . Left hand pain 10/30/2015  .  Fracture, finger, multiple sites 10/30/2015  . Colon cancer screening 07/23/2015  . Screening examination for infectious disease 07/23/2015  . Gout 02/18/2015  . Depression 01/06/2015  . Irritation of eyelid 08/20/2014  . Dupuytren's contracture 08/20/2014  . Cough 07/21/2014  . Lumbar stenosis with neurogenic claudication 06/13/2014  . Preop cardiovascular exam 05/28/2014  . S/P deep brain stimulator placement 05/08/2014  . PD (Parkinson's disease) ([place]) 05/08/2014  . Joint pain 01/22/2014  . Medicare annual wellness visit, initial 01/19/2014  . Advance care planning 01/19/2014  . Parkinson's disease ([place]) 12/13/2013  . PTSD (post-traumatic stress disorder) 06/13/2013  . Erectile dysfunction 06/13/2013  . HLD (hyperlipidemia) 06/13/2013  . Hip pain 06/10/2013  . Pain in joint, shoulder region 06/10/2013  . Right leg swelling 06/03/2013  . Essential hypertension 06/03/2013  . Bradycardia by electrocardiogram 06/03/2013  . Obstructive sleep apnea 03/12/2013  . Spinal stenosis, lumbar region, with neurogenic claudication 12/14/2012    [name] 06/01/2016, 12:46 PM Theone Murdoch, OTR/L [phone] Phone: [phone] 12:51 PM 06/01/16 [facility] [address] [place], [place], [address] Phone: [phone]   Fax:  [phone]  Name: [name] MRN: [id number] Date of Birth: 1951/03/07

## 2016-06-01 NOTE — Therapy (Signed)
[facility] [address] [place], [place], [address] Phone: [phone]   Fax:  [phone]  Speech Language Pathology Treatment  Patient Details  Name: [name] MRN: [id number] Date of Birth: Mar 20, 1951 Referring Provider: Alonza Bogus, DO  Encounter Date: 06/01/2016      End of Session - 06/01/16 1044    Visit Number 5   Number of Visits 17   Date for SLP Re-Evaluation 07/22/16   SLP Start Time 0933   SLP Stop Time  [id number]   SLP Time Calculation (min) 42 min   Activity Tolerance Patient tolerated treatment well      Past Medical History:  Diagnosis Date  . Arthritis   . Bradycardia   . Cancer [facility]) 2013   skin cancer  . Depression    ptsd  . Dysrhythmia    chronic slow heart rate  . GERD (gastroesophageal reflux disease)   . Headache(784.0)    tension headaches non recent  . History of chicken pox   . History of kidney stones   . Hypertension    treated with HCTZ  . Parkinson's disease ([place])    dx'ed 15 years ago  . PTSD (post-traumatic stress disorder)   . Shortness of breath dyspnea   . Sleep apnea    doesn't use C-pap  . Varicose veins     Past Surgical History:  Procedure Laterality Date  . CHOLECYSTECTOMY N/A 10/22/2014   Procedure: LAPAROSCOPIC CHOLECYSTECTOMY WITH INTRAOPERATIVE CHOLANGIOGRAM;  Surgeon: Dia Crawford III, MD;  Location: [facility] ORS;  Service: General;  Laterality: N/A;  . cyst removed      from lip as a child  . LUMBAR LAMINECTOMY/DECOMPRESSION MICRODISCECTOMY Bilateral 12/14/2012   Procedure: Bilateral lumbar three-four, four-five decompressive laminotomy/foraminotomy;  Surgeon: Charlie Pitter, MD;  Location: [place] NEURO ORS;  Service: Neurosurgery;  Laterality: Bilateral;  . PULSE GENERATOR IMPLANT Bilateral 12/13/2013   Procedure: Bilateral implantable pulse generator placement;  Surgeon: Erline Levine, MD;  Location: [place] NEURO ORS;  Service: Neurosurgery;  Laterality: Bilateral;   Bilateral implantable pulse generator placement  . skin cancer removed     from ears,   12 lft arm  rt leg 15  . SUBTHALAMIC STIMULATOR INSERTION Bilateral 12/06/2013   Procedure: SUBTHALAMIC STIMULATOR INSERTION;  Surgeon: Erline Levine, MD;  Location: [place] NEURO ORS;  Service: Neurosurgery;  Laterality: Bilateral;  Bilateral deep brain stimulator placement    There were no vitals filed for this visit.      Subjective Assessment - 06/01/16 0943    Subjective Pt was completing his homework in lobby.               ADULT SLP TREATMENT - 06/01/16 0950      General Information   Behavior/Cognition Alert;Cooperative;Pleasant mood     Treatment Provided   Treatment provided Cognitive-Linquistic     Pain Assessment   Pain Assessment 0-10   Pain Score 4    Pain Location back   Pain Descriptors / Indicators Sore   Pain Intervention(s) Monitored during session     Cognitive-Linquistic Treatment   Treatment focused on Dysarthria   Skilled Treatment Given "S", SLP educated/re-educated pt on the importance of consistency of practice from day to day instead of "cramming" for therapy. In practice for rate reduction pt read sentences of 6-10 words with consistent mod-max SLP cues faded to occasional mod cues from SLP. Pt success at rate reduction improved as task progressed from <20% to 60% by end of  task (35 sentences). Pt's awareness of unintelligible sentences was very low (<5%).  Wife reports pt coughed x2 with breakfast, reitereated to pt that he needs to slow down and take smaller SINGLE sips.     Assessment / Recommendations / Plan   Plan Continue with current plan of care     Progression Toward Goals   Progression toward goals Progressing toward goals  consistent practice between sessions is essential ofr progre          SLP Education - 06/01/16 1042    Education provided Yes   Education Details slowing down while eating and taking SINGLE SMALL sips is recommended,  consistency between sessions is essential for progress in therapy (not "cramming" with homework), slowing rate is necessary to incr speech clarity, talk with PT re: appropriateness for [employer]) Educated Patient;Spouse   Methods Explanation;Demonstration;Verbal cues   Comprehension Verbalized understanding          SLP Short Term Goals - 06/01/16 1047      SLP SHORT TERM GOAL #1   Title pt will maintain speech volume appropriate for 95-100% intelligibilty in 7 minutes simple conversation with rare min A   Time 2   Period Weeks   Status On-going     SLP SHORT TERM GOAL #2   Title pt will demo emergent awareness re: reduced speech loudness in conversation, by repeating hjimself spontaneously 50% of opportunities   Time 2   Period Weeks   Status Revised     SLP SHORT TERM GOAL #3   Title pt will demo knowledge of memory compensations during 3 therapy sessions   Time 2   Period Weeks   Status On-going     SLP SHORT TERM GOAL #4   Title pt will perform dysphagia HEP with occasional min cues over three sessions    Time 2   Period Weeks   Status On-going          SLP Long Term Goals - 06/01/16 1048      SLP LONG TERM GOAL #1   Title pt will maintain speech volume adequate to achieve at least 95% intelligibility in a noisy environment over 10 minutes of simple to mod complex conversation over three sessions   Time 6   Period Weeks   Status On-going     SLP LONG TERM GOAL #2   Title pt will demo emergent awareness of low speech volume in 10 minutes conversation by self-repeating in 75% of opportunities   Time 6   Period Weeks   Status On-going     SLP LONG TERM GOAL #3   Title pt will complete dysphagia HEP with rare min cues over three sessions   Time 6   Period Weeks   Status On-going          Plan - 06/01/16 1045    Clinical Impression Statement Today pt demonstrated reduced communicative effectiveness primarily due to rushes of speech  and reduced volume, with reduced awareness. With repetition and max cueing, awareness of decr'd speech clarity in structured tasks had improved as he was more regularly using compensations for speech intelligiblity. Skilled ST remains needed to improve pt's speech clarity as well as strengthen his swallowing  musculature.Pt will need to consistently copmlete homework for functional progress to be seen.   Speech Therapy Frequency 2x / week   Duration --  8 weeks   Treatment/Interventions Compensatory techniques;Cognitive reorganization;SLP instruction and feedback;Internal/external aids;Functional tasks;Patient/family education;Cueing hierarchy  Potential to Achieve Goals Good   Potential Considerations Ability to learn/carryover information;Cooperation/participation level;Severity of impairments   SLP Home Exercise Plan provided today   Consulted and Agree with Plan of Care Patient      Patient will benefit from skilled therapeutic intervention in order to improve the following deficits and impairments:   Dysarthria and anarthria  Cognitive communication deficit  Dysphagia, oropharyngeal    Problem List Patient Active Problem List   Diagnosis Date Noted  . Trochanteric bursitis 03/03/2016  . Left hand pain 10/30/2015  . Fracture, finger, multiple sites 10/30/2015  . Colon cancer screening 07/23/2015  . Screening examination for infectious disease 07/23/2015  . Gout 02/18/2015  . Depression 01/06/2015  . Irritation of eyelid 08/20/2014  . Dupuytren's contracture 08/20/2014  . Cough 07/21/2014  . Lumbar stenosis with neurogenic claudication 06/13/2014  . Preop cardiovascular exam 05/28/2014  . S/P deep brain stimulator placement 05/08/2014  . PD (Parkinson's disease) ([place]) 05/08/2014  . Joint pain 01/22/2014  . Medicare annual wellness visit, initial 01/19/2014  . Advance care planning 01/19/2014  . Parkinson's disease ([place]) 12/13/2013  . PTSD (post-traumatic stress disorder)  06/13/2013  . Erectile dysfunction 06/13/2013  . HLD (hyperlipidemia) 06/13/2013  . Hip pain 06/10/2013  . Pain in joint, shoulder region 06/10/2013  . Right leg swelling 06/03/2013  . Essential hypertension 06/03/2013  . Bradycardia by electrocardiogram 06/03/2013  . Obstructive sleep apnea 03/12/2013  . Spinal stenosis, lumbar region, with neurogenic claudication 12/14/2012    [facility] ,MS, CCC-SLP  06/01/2016, 10:50 AM  [facility] [address] [place], [place], [address] Phone: [phone]   Fax:  [phone]   Name: [name] MRN: [id number] Date of Birth: March 07, 1951

## 2016-06-01 NOTE — Patient Instructions (Signed)
  Please complete the assigned speech therapy homework and return it to your next session.  

## 2016-06-01 NOTE — Patient Instructions (Addendum)
Butterfly, Sitting    Sit straight or with back against wall. Gently push knees toward floor. Hold _15-30_ seconds. Repeat __3_ times per session. Do _2-3__ sessions per day.   You can relax your feet out a bit and sit in the "circle sit" position to work in the garage area. Copyright  VHI. All rights reserved.  Knee to Chest (Flexion)    Pull knee toward chest. Feel stretch in lower back or buttock area. Breathing deeply, Hold _15-30__ seconds. Repeat with other knee. Repeat __3__ times. Do _1-2___ sessions per day.  http://gt2.exer.us/225   Copyright  VHI. All rights reserved.

## 2016-06-02 NOTE — Therapy (Signed)
[facility] [address] [place], [place], [address] Phone: [phone]   Fax:  [phone]  Physical Therapy Treatment  Patient Details  Name: [name] MRN: [id number] Date of Birth: 1950/07/28 Referring Provider: Alonza Bogus  Encounter Date: 06/01/2016      PT End of Session - 06/02/16 1119    Visit Number 5   Number of Visits 13   Date for PT Re-Evaluation 07/05/16   Authorization Type [employer] PPO-GCODE every 10th visit   PT Start Time 1104   PT Stop Time 1145   PT Time Calculation (min) 41 min   Equipment Utilized During Treatment Gait belt   Activity Tolerance Patient tolerated treatment well   Behavior During Therapy [facility] for tasks assessed/performed      Past Medical History:  Diagnosis Date  . Arthritis   . Bradycardia   . Cancer [facility]) 2013   skin cancer  . Depression    ptsd  . Dysrhythmia    chronic slow heart rate  . GERD (gastroesophageal reflux disease)   . Headache(784.0)    tension headaches non recent  . History of chicken pox   . History of kidney stones   . Hypertension    treated with HCTZ  . Parkinson's disease ([place])    dx'ed 15 years ago  . PTSD (post-traumatic stress disorder)   . Shortness of breath dyspnea   . Sleep apnea    doesn't use C-pap  . Varicose veins     Past Surgical History:  Procedure Laterality Date  . CHOLECYSTECTOMY N/A 10/22/2014   Procedure: LAPAROSCOPIC CHOLECYSTECTOMY WITH INTRAOPERATIVE CHOLANGIOGRAM;  Surgeon: Dia Crawford III, MD;  Location: [facility] ORS;  Service: General;  Laterality: N/A;  . cyst removed      from lip as a child  . LUMBAR LAMINECTOMY/DECOMPRESSION MICRODISCECTOMY Bilateral 12/14/2012   Procedure: Bilateral lumbar three-four, four-five decompressive laminotomy/foraminotomy;  Surgeon: Charlie Pitter, MD;  Location: [place] NEURO ORS;  Service: Neurosurgery;  Laterality: Bilateral;  . PULSE GENERATOR IMPLANT Bilateral 12/13/2013    Procedure: Bilateral implantable pulse generator placement;  Surgeon: Erline Levine, MD;  Location: [place] NEURO ORS;  Service: Neurosurgery;  Laterality: Bilateral;  Bilateral implantable pulse generator placement  . skin cancer removed     from ears,   12 lft arm  rt leg 15  . SUBTHALAMIC STIMULATOR INSERTION Bilateral 12/06/2013   Procedure: SUBTHALAMIC STIMULATOR INSERTION;  Surgeon: Erline Levine, MD;  Location: [place] NEURO ORS;  Service: Neurosurgery;  Laterality: Bilateral;  Bilateral deep brain stimulator placement    There were no vitals filed for this visit.      Subjective Assessment - 06/01/16 1107    Subjective Patient presents to OP PT today, using his U-step RW.  He has used U-step since last visit, except for short distance to car, as patient's ramp is not yet completed at home (to be started this week).  Reports no falls since last PT visit.   Patient is accompained by: Family member  spouse   Patient Stated Goals Pt's goal for therapy is to help with legs and with balance.   Currently in Pain? Yes   Pain Score 3    Pain Location Shoulder   Pain Orientation Mid   Pain Descriptors / Indicators Aching   Pain Type Chronic pain   Pain Onset More than a month ago   Aggravating Factors  chronic, but aggravated by getting hit in head last week-notes that aggravated prior issue  Pain Relieving Factors doorway stretch helped in therapy         Therapeutic Activity: Floor>stand transfer:  Pt transfers to floor with use of UE support at mat.  Discussed, then practiced optimal floor>stand transfer technique, 2 reps with min UE support, min guard assistance, then 1 rep no UE support, min assistance.  Cues provided for importance of chair, table, sturdy surface for UE support and cues provided for wide BOS upon standing for optimal stability upon standing.  Wife present to assist with floor>stand transfer, x 1 rep, with cues provided to wife on need to bring chair or sturdy support surface to  patient if he is in middle of floor, body mechanics for assistance in lifting patient from floor, hand placement on belt to assist.  [name] verbalizes understanding.  -Activities performed in circle sit position with reaching behind, forward-up, forward-down, to simulate activities in workshop, with initial cues for widened circle sit position (versus sitting cross leg, with posterior pelvic tilt and occasions of posterior LOB)  Therapeutic Exercise: Seated floor exercises to address hip and low back flexibility, as patient reports difficulty maintatining cross-sit position and working in workshop.  -Circle sit position for improved posture, with cues for increased lumbar lordosis, upright posture -Circle sit (butterfly position), with gentle press through knees for hip abduction/external rotation stretch, 3 x 15 seconds -Lying on mat:  Single knee to chest 15 sec, x 2 reps each side; double knee to chest x 10 seconds (cues to not hold breath during exercise)                  [facility] Adult PT Treatment/Exercise - 06/02/16 0001      Self-Care   Self-Care Other Self-Care Comments   Other Self-Care Comments  Reviewed discussion from previous session regarding consistent use of U-step RW.  Pt appears to be using U-step RW at all times since previous PT visit, except for short distance home<>car, as ramp is not completed.  Pt has had no falls.  PT reinforced benefits and safety of continued, consistent use of U-step RW for remainder of 2-week period agreed upon with therapist in previous PT session.  Pt in agreement, but does report he feels like using the walker means he is "submitting" to Parkinson's.  Had brief discussion to answer patient's questions regarding [employer].  Encouraged patient to NOT participate in [facility] boxing classes at this time, due to pt's decreased balance, postural instability, and recurrent falls.                PT Education - 06/02/16 1114     Education Details Floor>stand transfer technique with wife's assistance; HEP additions per patient request-see instructions; see self care   Person(s) Educated Patient;Spouse   Methods Explanation;Demonstration;Verbal cues;Handout   Comprehension Verbalized understanding;Returned demonstration;Need further instruction          PT Short Term Goals - 05/30/16 1426      PT SHORT TERM GOAL #1   Title Pt will perform HEP with family's supervision, for improved balance, transfers, and gait.  TARGET 06/08/16   Time 4   Period Weeks   Status On-going     PT SHORT TERM GOAL #2   Title Pt will perform 5x sit<>stand in less than or equal to 15 seconds for improved efficiency and safety with transfers.   Time 4   Status On-going     PT SHORT TERM GOAL #3   Title Pt will improve TUG score  to less than or equal to 13.5 seconds for decreased fall risk.   Time 4   Period Weeks   Status On-going     PT SHORT TERM GOAL #4   Title Pt will perform floor>stand transfer with min assist of family member for improved fall recovery.   Time 4   Period Weeks   Status On-going     PT SHORT TERM GOAL #5   Title Pt will verbalize at least 3 means to decrease pain along R lateral thigh/iliotibial band.   Time 4   Period Weeks   Status On-going           PT Long Term Goals - 05/09/16 2124      PT LONG TERM GOAL #1   Title Pt/family will verabalize understanding of fall prevention/tips to reduce freezing with gait in the home environment.  TARGET 07/07/16   Time 6   Period Weeks   Status New     PT LONG TERM GOAL #2   Title Pt will improve gait velocity to at least 2.62 ft/sec for improved community ambulator status (with appropriate assistive device).   Time 6   Period Weeks   Status New     PT LONG TERM GOAL #3   Title Pt will report at least 25% improvement in performance of low surface transfers for improved ability to get in and out of car as well as restaraunt seating.    Time 6    Period Weeks   Status New     PT LONG TERM GOAL #4   Title Pt/wife will verbalize understanding of plans for continued community fitness upon D/C from PT.   Time 6   Period Weeks   Status New     PT LONG TERM GOAL #5   Title Pt will demonstrate proper use and technique of U-step RW for improved household and community gait.   Time 6   Period Weeks   Status New               Plan - 06/02/16 1120    Clinical Impression Statement Skilled PT session focused today on reinforcement of safety with gait, floor transfer technique and stretches (per patient request).  Wife educated in proper technique of floor>stand transfers. Pt continues to benefit from skilled physical therapy to address balance, posture,a nd gait.     Rehab Potential Good   PT Frequency 2x / week   PT Duration 6 weeks   PT Treatment/Interventions ADLs/Self Care Home Management;Functional mobility training;Gait training;DME Instruction;Neuromuscular re-education;Patient/family education;Balance training;Therapeutic exercise;Therapeutic activities;Manual techniques   PT Next Visit Plan Review floor transfers if needed;  check on use of U-step walker (agrees to use at all times thru 2/27), review HEP given 2/14; Check STGs; additional postural stability, balance activities   Consulted and Agree with Plan of Care Patient;Family member/caregiver   Family Member Consulted spouse      Patient will benefit from skilled therapeutic intervention in order to improve the following deficits and impairments:  Abnormal gait, Decreased balance, Decreased mobility, Decreased knowledge of use of DME, Decreased safety awareness, Decreased strength, Difficulty walking, Postural dysfunction, Pain  Visit Diagnosis: Unsteadiness on feet  Abnormal posture     Problem List Patient Active Problem List   Diagnosis Date Noted  . Trochanteric bursitis 03/03/2016  . Left hand pain 10/30/2015  . Fracture, finger, multiple sites  10/30/2015  . Colon cancer screening 07/23/2015  . Screening examination for infectious disease 07/23/2015  . Gout  02/18/2015  . Depression 01/06/2015  . Irritation of eyelid 08/20/2014  . Dupuytren's contracture 08/20/2014  . Cough 07/21/2014  . Lumbar stenosis with neurogenic claudication 06/13/2014  . Preop cardiovascular exam 05/28/2014  . S/P deep brain stimulator placement 05/08/2014  . PD (Parkinson's disease) ([place]) 05/08/2014  . Joint pain 01/22/2014  . Medicare annual wellness visit, initial 01/19/2014  . Advance care planning 01/19/2014  . Parkinson's disease ([place]) 12/13/2013  . PTSD (post-traumatic stress disorder) 06/13/2013  . Erectile dysfunction 06/13/2013  . HLD (hyperlipidemia) 06/13/2013  . Hip pain 06/10/2013  . Pain in joint, shoulder region 06/10/2013  . Right leg swelling 06/03/2013  . Essential hypertension 06/03/2013  . Bradycardia by electrocardiogram 06/03/2013  . Obstructive sleep apnea 03/12/2013  . Spinal stenosis, lumbar region, with neurogenic claudication 12/14/2012    Kaina Orengo W. 06/02/2016, 11:24 AM [facility] PT [facility] [address] [place], [place], [address] Phone: [phone]   Fax:  [phone]  Name: [name] MRN: [id number] Date of Birth: 1950/07/14

## 2016-06-04 ENCOUNTER — Encounter: Payer: Self-pay | Admitting: Emergency Medicine

## 2016-06-04 ENCOUNTER — Emergency Department
Admission: EM | Admit: 2016-06-04 | Discharge: 2016-06-05 | Disposition: A | Discharge status: Home / Self Care | Payer: Medicare PPO | Attending: Emergency Medicine | Admitting: Emergency Medicine

## 2016-06-04 DIAGNOSIS — I10 Essential (primary) hypertension: Secondary | ICD-10-CM | POA: Diagnosis not present

## 2016-06-04 DIAGNOSIS — Z853 Personal history of malignant neoplasm of breast: Secondary | ICD-10-CM | POA: Insufficient documentation

## 2016-06-04 DIAGNOSIS — R5383 Other fatigue: Secondary | ICD-10-CM | POA: Diagnosis not present

## 2016-06-04 DIAGNOSIS — R400 Somnolence: Secondary | ICD-10-CM | POA: Diagnosis not present

## 2016-06-04 DIAGNOSIS — R001 Bradycardia, unspecified: Secondary | ICD-10-CM | POA: Diagnosis not present

## 2016-06-04 DIAGNOSIS — Z79899 Other long term (current) drug therapy: Secondary | ICD-10-CM | POA: Insufficient documentation

## 2016-06-04 DIAGNOSIS — G20 Parkinson's disease: Secondary | ICD-10-CM

## 2016-06-04 DIAGNOSIS — Z87891 Personal history of nicotine dependence: Secondary | ICD-10-CM | POA: Insufficient documentation

## 2016-06-04 LAB — URINALYSIS, COMPLETE (UACMP) WITH MICROSCOPIC
Bilirubin Urine: NEGATIVE
GLUCOSE, UA: NEGATIVE mg/dL
Ketones, ur: 5 mg/dL — AB
LEUKOCYTES UA: NEGATIVE
Nitrite: NEGATIVE
PH: 5 (ref 5.0–8.0)
Protein, ur: NEGATIVE mg/dL
SPECIFIC GRAVITY, URINE: 1.017 (ref 1.005–1.030)

## 2016-06-04 LAB — CBC WITH DIFFERENTIAL/PLATELET
BASOS PCT: 1 %
Basophils Absolute: 0 10*3/uL (ref 0–0.1)
EOS ABS: 0.1 10*3/uL (ref 0–0.7)
EOS PCT: 2 %
HCT: 42.8 % (ref 40.0–52.0)
Hemoglobin: 14.5 g/dL (ref 13.0–18.0)
LYMPHS ABS: 2.5 10*3/uL (ref 1.0–3.6)
Lymphocytes Relative: 33 %
MCH: 30.7 pg (ref 26.0–34.0)
MCHC: 33.9 g/dL (ref 32.0–36.0)
MCV: 90.6 fL (ref 80.0–100.0)
Monocytes Absolute: 0.5 10*3/uL (ref 0.2–1.0)
Monocytes Relative: 7 %
NEUTROS PCT: 57 %
Neutro Abs: 4.3 10*3/uL (ref 1.4–6.5)
PLATELETS: 192 10*3/uL (ref 150–440)
RBC: 4.72 MIL/uL (ref 4.40–5.90)
RDW: 14 % (ref 11.5–14.5)
WBC: 7.5 10*3/uL (ref 3.8–10.6)

## 2016-06-04 LAB — COMPREHENSIVE METABOLIC PANEL
ALK PHOS: 87 U/L (ref 38–126)
ALT: 28 U/L (ref 17–63)
AST: 57 U/L — AB (ref 15–41)
Albumin: 4 g/dL (ref 3.5–5.0)
Anion gap: 8 (ref 5–15)
BILIRUBIN TOTAL: 0.8 mg/dL (ref 0.3–1.2)
BUN: 21 mg/dL — AB (ref 6–20)
CALCIUM: 9 mg/dL (ref 8.9–10.3)
CO2: 26 mmol/L (ref 22–32)
CREATININE: 0.93 mg/dL (ref 0.61–1.24)
Chloride: 107 mmol/L (ref 101–111)
Glucose, Bld: 125 mg/dL — ABNORMAL HIGH (ref 65–99)
Potassium: 4.8 mmol/L (ref 3.5–5.1)
Sodium: 141 mmol/L (ref 135–145)
Total Protein: 7 g/dL (ref 6.5–8.1)

## 2016-06-04 LAB — MAGNESIUM: Magnesium: 2.2 mg/dL (ref 1.7–2.4)

## 2016-06-04 LAB — TROPONIN I: Troponin I: <0.03 ng/mL (ref <0.03)

## 2016-06-04 NOTE — ED Provider Notes (Signed)
[facility] Emergency Department Provider Note  ____________________________________________   First MD Initiated Contact with Patient 06/04/16 2301     (approximate)  I have reviewed the triage vital signs and the nursing notes.   HISTORY  Chief Complaint Bradycardia    HPI [name] is a 66 y.o. male with a complicated history that includes Parkinson's disease with 2 deep brain stimulators and a history of chronic bradycardia.  Dr. Rockey Situ is his cardiologist and reportedly there plans were discussions about a pacemaker but there are complications given the deep brain stimulators.  The patient arrives by EMS tonight because his wife was concerned about his excessive fatigue.  Reportedly this has been present for the last 4 or 5 days but was worse tonight.  The symptoms of any gradual in onset and nothing is making them better.  According to the patient, who is alert and oriented and only reports feeling tired, they recently increased his Parkinson's medications but he is not sure which one(s).  He reports that he has been to the [place] in the past but most of his doctors are at [facility] at this time.  He denies fever/chills, chest pain, shortness of breath, nausea, vomiting, abdominal pain, dysuria.  He states he feels fine except that he has not had energy for several days.   Past Medical History:  Diagnosis Date  . Arthritis   . Bradycardia   . Cancer [facility]) 2013   skin cancer  . Depression    ptsd  . Dysrhythmia    chronic slow heart rate  . GERD (gastroesophageal reflux disease)   . Headache(784.0)    tension headaches non recent  . History of chicken pox   . History of kidney stones   . Hypertension    treated with HCTZ  . Parkinson's disease ([place])    dx'ed 15 years ago  . PTSD (post-traumatic stress disorder)   . Shortness of breath dyspnea   . Sleep apnea    doesn't use C-pap  . Varicose veins     Patient Active Problem List   Diagnosis Date Noted  . Trochanteric bursitis 03/03/2016  . Left hand pain 10/30/2015  . Fracture, finger, multiple sites 10/30/2015  . Colon cancer screening 07/23/2015  . Screening examination for infectious disease 07/23/2015  . Gout 02/18/2015  . Depression 01/06/2015  . Irritation of eyelid 08/20/2014  . Dupuytren's contracture 08/20/2014  . Cough 07/21/2014  . Lumbar stenosis with neurogenic claudication 06/13/2014  . Preop cardiovascular exam 05/28/2014  . S/P deep brain stimulator placement 05/08/2014  . PD (Parkinson's disease) ([place]) 05/08/2014  . Joint pain 01/22/2014  . Medicare annual wellness visit, initial 01/19/2014  . Advance care planning 01/19/2014  . Parkinson's disease ([place]) 12/13/2013  . PTSD (post-traumatic stress disorder) 06/13/2013  . Erectile dysfunction 06/13/2013  . HLD (hyperlipidemia) 06/13/2013  . Hip pain 06/10/2013  . Pain in joint, shoulder region 06/10/2013  . Right leg swelling 06/03/2013  . Essential hypertension 06/03/2013  . Bradycardia by electrocardiogram 06/03/2013  . Obstructive sleep apnea 03/12/2013  . Spinal stenosis, lumbar region, with neurogenic claudication 12/14/2012    Past Surgical History:  Procedure Laterality Date  . CHOLECYSTECTOMY N/A 10/22/2014   Procedure: LAPAROSCOPIC CHOLECYSTECTOMY WITH INTRAOPERATIVE CHOLANGIOGRAM;  Surgeon: Dia Crawford III, MD;  Location: [facility] ORS;  Service: General;  Laterality: N/A;  . cyst removed      from lip as a child  . LUMBAR LAMINECTOMY/DECOMPRESSION MICRODISCECTOMY Bilateral 12/14/2012   Procedure:  Bilateral lumbar three-four, four-five decompressive laminotomy/foraminotomy;  Surgeon: Charlie Pitter, MD;  Location: [place] NEURO ORS;  Service: Neurosurgery;  Laterality: Bilateral;  . PULSE GENERATOR IMPLANT Bilateral 12/13/2013   Procedure: Bilateral implantable pulse generator placement;  Surgeon: Erline Levine, MD;  Location: [place] NEURO ORS;  Service: Neurosurgery;  Laterality: Bilateral;  Bilateral  implantable pulse generator placement  . skin cancer removed     from ears,   12 lft arm  rt leg 15  . SUBTHALAMIC STIMULATOR INSERTION Bilateral 12/06/2013   Procedure: SUBTHALAMIC STIMULATOR INSERTION;  Surgeon: Erline Levine, MD;  Location: [place] NEURO ORS;  Service: Neurosurgery;  Laterality: Bilateral;  Bilateral deep brain stimulator placement    Prior to Admission medications   Medication Sig Start Date End Date Taking? Authorizing Provider  Artificial Tear Ointment (DRY EYES OP) Apply to eye.   Yes Historical Provider, MD  buPROPion (WELLBUTRIN XL) 150 MG 24 hr tablet TAKE 3 TABLETS DAILY 07/01/15  Yes Tonia Ghent, MD  carbidopa-levodopa (SINEMET IR) 25-100 MG per tablet Take by mouth. 2 AM/ 1 afternoon/ 1 evening   Yes Historical Provider, MD  cholecalciferol (VITAMIN D) 1000 UNITS tablet Take 1,000 Units by mouth daily.   Yes Historical Provider, MD  escitalopram (LEXAPRO) 10 MG tablet TAKE 1 TABLET DAILY 01/04/16  Yes Tonia Ghent, MD  naproxen (NAPROSYN) 500 MG tablet Take 500 mg by mouth 2 (two) times daily with a meal.   Yes Historical Provider, MD  oxyCODONE-acetaminophen (PERCOCET/ROXICET) 5-325 MG tablet Take 1-2 tablets by mouth every 6 (six) hours as needed for severe pain (sedation caution). 10/26/15  Yes Tonia Ghent, MD  Pimavanserin Tartrate (NUPLAZID) 17 MG TABS Take 2 tablets by mouth daily. 05/31/16  Yes Rebecca S Tat, DO  pravastatin (PRAVACHOL) 20 MG tablet Take 1 tablet (20 mg total) by mouth every evening. Patient not taking: Reported on 06/04/2016 05/10/16   Minna Merritts, MD    Allergies Klonopin [clonazepam] and Morphine and related  Family History  Problem Relation Age of Onset  . Lung cancer Father   . Colon cancer Neg Hx   . Prostate cancer Neg Hx     Social History Social History  Substance Use Topics  . Smoking status: Never Smoker  . Smokeless tobacco: Former Systems developer  . Alcohol use 0.0 oz/week     Comment: occasional (twice per month)     Review of Systems Constitutional: No fever/chills.  Generalized fatigue. Eyes: No visual changes. ENT: No sore throat. Cardiovascular: Denies chest pain. Respiratory: Denies shortness of breath. Gastrointestinal: No abdominal pain.  No nausea, no vomiting.  No diarrhea.  No constipation. Genitourinary: Negative for dysuria. Musculoskeletal: Negative for back pain. Skin: Negative for rash. Neurological: Negative for headaches, focal weakness or numbness.  10-point ROS otherwise negative.  ____________________________________________   PHYSICAL EXAM:  VITAL SIGNS: ED Triage Vitals  Enc Vitals Group     BP 06/04/16 2302 (!) 153/74     Pulse Rate 06/04/16 2302 (!) 38     Resp 06/04/16 2302 16     Temp 06/04/16 2302 97.9 F (36.6 C)     Temp Source 06/04/16 2302 Oral     SpO2 06/04/16 2253 98 %     Weight 06/04/16 2258 180 lb (81.6 kg)     Height 06/04/16 2258 5\' 9"  (1.753 m)     Head Circumference --      Peak Flow --      Pain Score --  Pain Loc --      Pain Edu? --      Excl. in [place]? --     Constitutional: Alert and oriented. Well appearing and in no acute distress. Eyes: Conjunctivae are normal. PERRL. EOMI. Head: Atraumatic. Nose: No congestion/rhinnorhea. Mouth/Throat: Mucous membranes are moist. Neck: No stridor.  No meningeal signs.   Cardiovascular: Normal rate, regular rhythm. Good peripheral circulation. Grossly normal heart sounds. Respiratory: Normal respiratory effort.  No retractions. Lungs CTAB. Gastrointestinal: Soft and nontender. No distention.  Musculoskeletal: No lower extremity tenderness nor edema. No gross deformities of extremities. Neurologic:  Normal speech and language. No gross focal neurologic deficits are appreciated. No cogwheel rigidity, good muscles strength throughout.  No tremor. Skin:  Skin is warm, dry and intact. No rash noted. Psychiatric: Mood and affect are normal. Speech and behavior are  normal.  ____________________________________________   LABS (all labs ordered are listed, but only abnormal results are displayed)  Labs Reviewed  COMPREHENSIVE METABOLIC PANEL - Abnormal; Notable for the following:       Result Value   Glucose, Bld 125 (*)    BUN 21 (*)    AST 57 (*)    All other components within normal limits  URINALYSIS, COMPLETE (UACMP) WITH MICROSCOPIC - Abnormal; Notable for the following:    Color, Urine YELLOW (*)    APPearance CLEAR (*)    Hgb urine dipstick SMALL (*)    Ketones, ur 5 (*)    Bacteria, UA RARE (*)    Squamous Epithelial / LPF 0-5 (*)    All other components within normal limits  TROPONIN I  CBC WITH DIFFERENTIAL/PLATELET  MAGNESIUM   ____________________________________________  EKG  ED ECG REPORT I, Koltin Wehmeyer, the attending physician, personally viewed and interpreted this ECG.  Date: 06/04/2016 EKG Time: 23:08 Rate: 40 Rhythm: Unable to determine due to artifact from deep brain stimulator QRS Axis: normal Intervals: Unable to determine due to artifact from deep brain stimulator ST/T Wave abnormalities: Unable to determine due to artifact from deep brain stimulator Conduction Disturbances: none Narrative Interpretation: No evidence of acute ischemia but difficult to interpret due to artifact  ____________________________________________  RADIOLOGY   No results found.  ____________________________________________   PROCEDURES  Procedure(s) performed:   Procedures   Critical Care performed: No ____________________________________________   INITIAL IMPRESSION / ASSESSMENT AND PLAN / ED COURSE  Pertinent labs & imaging results that were available during my care of the patient were reviewed by me and considered in my medical decision making (see chart for details).  The patient's symptoms seem mostly chronic and I suspect the somnolence/fatigue as a result not of the chronic bradycardia but of his  Parkinson's medications.  I will obtain lab work and monitor him until his wife arrives and we can have a discussion but I do not suspect that he has an acute or emergent medical condition requiring admission at this time.   Clinical Course as of Jun 06 127  Sun Jun 05, 2016  0107 Had a long discussion with the patient and family.  He looks better, no somnolent, stable BP with persistent bradycardia around 40 bpm.  we discussed possibility of hospital observation for possible symptomatically bradycardia, but I strongly feel that his somnolence is due to medication side effect rather than bradycardia.  We will get him up to ambulate to make sure he does not become orthostatic or symptomatic when he gets up and walks around, but otherwise his wife is comfortable with the plan  to follow up with his outpatient doctors, specifically the neurologist at [facility].I gave my usual and customary return precautions.   [CF]  0127 Patient ambulated without difficulty and with no orthostasis.  I will discharge him as discussed previously for close outpatient follow-up.  I gave him strict return precautions should he worsen.  His wife agrees with the plan.  [CF]    Clinical Course User Index [CF] Hinda Kehr, MD    ____________________________________________  FINAL CLINICAL IMPRESSION(S) / ED DIAGNOSES  Final diagnoses:  Chronic sinus bradycardia  Parkinson's disease ([place])  Somnolence     MEDICATIONS GIVEN DURING THIS VISIT:  Medications - No data to display   NEW OUTPATIENT MEDICATIONS STARTED DURING THIS VISIT:  New Prescriptions   No medications on file    Modified Medications   No medications on file    Discontinued Medications   No medications on file     Note:  This document was prepared using Dragon voice recognition software and may include unintentional dictation errors.    Hinda Kehr, MD 06/05/16 [phone]

## 2016-06-04 NOTE — ED Notes (Signed)
Difficulty with clear EKG d/t stimulators

## 2016-06-04 NOTE — ED Triage Notes (Signed)
Patient presents to Emergency Department via [facility] from home with complaints of wife being unable to wake pt.  Per EMS pt was ambulatory on scene  Pt has hx of Parkinson's and 2 x deep brain stimulator, Dr Karl Bales is cardiologist, hx of of Bradycardia.

## 2016-06-05 NOTE — Discharge Instructions (Signed)
As we discussed, your workup today was reassuring.  Though we do not know exactly what is causing your symptoms, it appears that you have no emergent medical condition at this time and that you are safe to go home and follow up as recommended in this paperwork.  We suspect your medication are causing the fatigue and difficulty waking him up at times.  Please return immediately to the Emergency Department if you develop any new or worsening symptoms that concern you.

## 2016-06-06 ENCOUNTER — Encounter: Payer: Self-pay | Admitting: Cardiovascular Disease

## 2016-06-06 ENCOUNTER — Ambulatory Visit (INDEPENDENT_AMBULATORY_CARE_PROVIDER_SITE_OTHER): Payer: Medicare PPO

## 2016-06-06 ENCOUNTER — Ambulatory Visit (INDEPENDENT_AMBULATORY_CARE_PROVIDER_SITE_OTHER): Payer: Medicare PPO | Admitting: Cardiovascular Disease

## 2016-06-06 ENCOUNTER — Telehealth: Payer: Self-pay | Admitting: Neurology

## 2016-06-06 VITALS — BP 124/80 | HR 49 | Ht 69.0 in | Wt 183.0 lb

## 2016-06-06 DIAGNOSIS — M48062 Spinal stenosis, lumbar region with neurogenic claudication: Secondary | ICD-10-CM | POA: Diagnosis not present

## 2016-06-06 DIAGNOSIS — Z9689 Presence of other specified functional implants: Secondary | ICD-10-CM

## 2016-06-06 DIAGNOSIS — E782 Mixed hyperlipidemia: Secondary | ICD-10-CM | POA: Diagnosis not present

## 2016-06-06 DIAGNOSIS — R001 Bradycardia, unspecified: Secondary | ICD-10-CM

## 2016-06-06 DIAGNOSIS — I10 Essential (primary) hypertension: Secondary | ICD-10-CM

## 2016-06-06 MED ORDER — PRAVASTATIN SODIUM 20 MG PO TABS: 20.0000 mg | ORAL_TABLET | Freq: Every evening | ORAL | 3 refills | Status: DC

## 2016-06-06 NOTE — Patient Instructions (Signed)
Medication Instructions:   No medication changes made  Labwork:  No new labs needed  Testing/Procedures:  We will order a Zio patch for bradycardia   I recommend watching educational videos on topics of interest to you at:       [email or web address]  Enter code: [email or web address]    Follow-Up: It was a pleasure seeing you in the office today. Please call us if you have new issues that need to be addressed before your next appt.  [phone]  Your physician wants you to follow-up in: 6 months.  You will receive a reminder letter in the mail two months in advance. If you don't receive a letter, please call our office to schedule the follow-up appointment.  If you need a refill on your cardiac medications before your next appointment, please call your pharmacy.

## 2016-06-06 NOTE — Telephone Encounter (Signed)
[name] Hal Hope called in regards to PT being in the hospital and would like a call back/Dawn CB# [phone]

## 2016-06-06 NOTE — Telephone Encounter (Signed)
Read his hospital note - it looks like ER doctor wanted him to see [place] due to excessive sleepiness. Please advise.

## 2016-06-06 NOTE — Telephone Encounter (Signed)
I just spoke with Dr. Damita Dunnings about his case after reading ER note.  I really don't think that his fairly low dose of levodopa is causing this sleepiness.  Off of nuplazid per records (confirm that).   He does have untreated OSAS which is a concern but patient was unwilling to wear CPAP.  I am still concerned about bradycardia (high 30's per ER records yesterday) and on no meds to explain that and after discussion with Dr. Damita Dunnings, he is going to send patient back to cardiology to evaluate.  I appreciate Dr. Carole Civil kindly providing his input today.

## 2016-06-06 NOTE — Telephone Encounter (Signed)
Spoke with patient's wife. They do have a cardiology appt this afternoon and will wait to see what they say from cardiology stand point.

## 2016-06-06 NOTE — Progress Notes (Signed)
Cardiology Office Note  Date:  06/06/2016   ID:  [name], DOB 11/27/1950, MRN [id number]  PCP:  Elsie Stain, MD   Chief Complaint  Patient presents with  . other    Follow from [facility] ER;decreased HR. Meds reviewed by the pt. verbally.     HPI:  [name] is a pleasant 66 -year-old male year-old gentleman with Parkinson's for more than 10 years, hypertension, chronic bradycardia who presents for routine follow-up of his blood pressure and bradycardia.  placement of deep brain stimulator August 2015 for tremor He receives most of his primary care followup at the [facility] Notes from there indicate history of hyperlipidemia, depression, Parkinson's, possible seizure disorder Long-standing history of heart rate 40-50 bpm and in general has been asymptomatic  In follow-up today he reports that he has increased fatigue Napping more in the daytime Started on new medication by Dr. Carles Collet for hallucinations at nighttime (pimavanserin) He had taken the medication for 2 days and patient's wife reported that he had difficulty waking him from a nap on Feb 17th 2018 "He did not wake up" Finally he woke up after prolonged stimulation She noticed that his heart rate was running low in the 30s Even after he was somewhat awake, heart rate persisted running in the 30s  He went to the emergency room, heart rate averaging around 40 Blood pressure was stable [id number] systolic  Since then heart rate running in the 40 range He denies any lightheadedness or dizziness. No orthostasis symptoms   Currently not participating in exercise program Total cho 150, LDL 90  history of chronic low back pain,  lumbar laminectomy 12/14/2012 by Dr. Annette Stable in [place]   Parkinson's medicines were significantly decreased after brain stimulator was placed.  EKG on today's visit shows sinus bradycardia rate 49 bpm, artifact from deep brain stimulator noted, left axis deviation  Other past medical history  reviewed CT scan of the head 05/29/2013 showing mild chronic inflammatory disease of the sinuses. This was done for right sided headache. Work done 05/29/2013 includes basic    PMH:   has a past medical history of Arthritis; Bradycardia; Cancer [facility]) (2013); Depression; Dysrhythmia; GERD (gastroesophageal reflux disease); Headache(784.0); History of chicken pox; History of kidney stones; Hypertension; Parkinson's disease ([place]); PTSD (post-traumatic stress disorder); Shortness of breath dyspnea; Sleep apnea; and Varicose veins.  PSH:    Past Surgical History:  Procedure Laterality Date  . CHOLECYSTECTOMY N/A 10/22/2014   Procedure: LAPAROSCOPIC CHOLECYSTECTOMY WITH INTRAOPERATIVE CHOLANGIOGRAM;  Surgeon: Dia Crawford III, MD;  Location: [facility] ORS;  Service: General;  Laterality: N/A;  . cyst removed      from lip as a child  . LUMBAR LAMINECTOMY/DECOMPRESSION MICRODISCECTOMY Bilateral 12/14/2012   Procedure: Bilateral lumbar three-four, four-five decompressive laminotomy/foraminotomy;  Surgeon: Charlie Pitter, MD;  Location: [place] NEURO ORS;  Service: Neurosurgery;  Laterality: Bilateral;  . PULSE GENERATOR IMPLANT Bilateral 12/13/2013   Procedure: Bilateral implantable pulse generator placement;  Surgeon: Erline Levine, MD;  Location: [place] NEURO ORS;  Service: Neurosurgery;  Laterality: Bilateral;  Bilateral implantable pulse generator placement  . skin cancer removed     from ears,   12 lft arm  rt leg 15  . SUBTHALAMIC STIMULATOR INSERTION Bilateral 12/06/2013   Procedure: SUBTHALAMIC STIMULATOR INSERTION;  Surgeon: Erline Levine, MD;  Location: [place] NEURO ORS;  Service: Neurosurgery;  Laterality: Bilateral;  Bilateral deep brain stimulator placement    Current Outpatient Prescriptions  Medication Sig Dispense Refill  . Artificial Tear Ointment (DRY  EYES OP) Apply to eye.    Marland Kitchen buPROPion (WELLBUTRIN XL) 150 MG 24 hr tablet TAKE 3 TABLETS DAILY 270 tablet 1  . carbidopa-levodopa (SINEMET IR) 25-100 MG per  tablet Take by mouth. 2 AM/ 1 afternoon/ 1 evening    . cholecalciferol (VITAMIN D) 1000 UNITS tablet Take 1,000 Units by mouth daily.    Marland Kitchen escitalopram (LEXAPRO) 10 MG tablet TAKE 1 TABLET DAILY 90 tablet 1  . naproxen (NAPROSYN) 500 MG tablet Take 500 mg by mouth 2 (two) times daily with a meal.    . oxyCODONE-acetaminophen (PERCOCET/ROXICET) 5-325 MG tablet Take 1-2 tablets by mouth every 6 (six) hours as needed for severe pain (sedation caution). 40 tablet 0  . Pimavanserin Tartrate (NUPLAZID) 17 MG TABS Take 2 tablets by mouth daily. 28 tablet 0  . pravastatin (PRAVACHOL) 20 MG tablet Take 1 tablet (20 mg total) by mouth every evening. 90 tablet 3   No current facility-administered medications for this visit.      Allergies:   Klonopin [clonazepam] and Morphine and related   Social History:  The patient  reports that he has never smoked. He has quit using smokeless tobacco. He reports that he drinks alcohol. He reports that he does not use drugs.   Family History:   family history includes Lung cancer in his father.    Review of Systems: Review of Systems  Constitutional: Positive for malaise/fatigue.  Respiratory: Negative.   Cardiovascular: Negative.   Gastrointestinal: Negative.   Musculoskeletal: Negative.   Neurological: Negative.   Psychiatric/Behavioral: Negative.   All other systems reviewed and are negative.    PHYSICAL EXAM: VS:  BP 124/80 (BP Location: Left Arm, Patient Position: Sitting, Cuff Size: Normal)   Pulse (!) 49   Ht 5\' 9"  (1.753 m)   Wt 183 lb (83 kg)   BMI 27.02 kg/m  , BMI Body mass index is 27.02 kg/m. GEN: Well nourished, well developed, in no acute distress  HEENT: normal  Neck: no JVD, carotid bruits, or masses Cardiac:Regular rhythm, bradycardic; no murmurs, rubs, or gallops,no edema  Respiratory:  clear to auscultation bilaterally, normal work of breathing GI: soft, nontender, nondistended, + BS MS: no deformity or atrophy  Skin: warm  and dry, no rash Neuro:  Strength and sensation are intact Psych: euthymic mood, minimally conversant    Recent Labs: 07/21/2015: TSH 0.56 06/04/2016: ALT 28; BUN 21; Creatinine, Ser 0.93; Hemoglobin 14.5; Magnesium 2.2; Platelets 192; Potassium 4.8; Sodium 141    Lipid Panel Lab Results  Component Value Date   CHOL 150 07/17/2015   HDL 33.10 (L) 07/17/2015   LDLCALC 90 07/17/2015   TRIG 136.0 07/17/2015      Wt Readings from Last 3 Encounters:  06/06/16 183 lb (83 kg)  06/04/16 180 lb (81.6 kg)  05/31/16 181 lb (82.1 kg)       ASSESSMENT AND PLAN:  Mixed hyperlipidemia - Plan: EKG 12-Lead, LONG TERM MONITOR (3-14 DAYS)  Essential hypertension - Plan: EKG 12-Lead, LONG TERM MONITOR (3-14 DAYS)  Spinal stenosis, lumbar region, with neurogenic claudication - Plan: EKG 12-Lead, LONG TERM MONITOR (3-14 DAYS)  S/P deep brain stimulator placement - Plan: EKG 12-Lead, LONG TERM MONITOR (3-14 DAYS)  Bradycardia Heart rate back to high 40s on today's visit which is his baseline, high 40 heart rate seen previously in July 2017 and dating back several years. Heart rate documented at rate of 40 recently in the emergency room, family reports in the 75s at home. Unclear  if there is any relation with his new medication. When he was seen in the hospital, he was in a deep sleep at home,  difficult to arouse. He had been on the medication for 2 days.  It would appear he isMaintaining good blood pressure despite his bradycardia. There does not appear to be documentation of hypotension associated with his bradycardia.  We haveRecommended a  2 weekmonitor ,  recommended he record his symptoms during that time and we will correlate this with his heart rate  Total encounter time more than 25 minutes Greater than 50% was spent in counseling and coordination of care with the patient  Disposition:   F/U  6 months   Orders Placed This Encounter  Procedures  . LONG TERM MONITOR (3-14 DAYS)  .  EKG 12-Lead     Signed, Esmond Plants, M.D., Ph.D. 06/06/2016  [place], [place]

## 2016-06-06 NOTE — Telephone Encounter (Signed)
That was fast!  Dr. Damita Dunnings, [place].Marland KitchenMarland Kitchen

## 2016-06-07 ENCOUNTER — Ambulatory Visit: Payer: Medicare PPO | Admitting: Occupational Therapy

## 2016-06-07 ENCOUNTER — Ambulatory Visit: Payer: Medicare PPO

## 2016-06-07 ENCOUNTER — Ambulatory Visit: Payer: Medicare PPO | Admitting: Physical Therapy

## 2016-06-07 DIAGNOSIS — R293 Abnormal posture: Secondary | ICD-10-CM | POA: Diagnosis not present

## 2016-06-07 DIAGNOSIS — R2681 Unsteadiness on feet: Secondary | ICD-10-CM

## 2016-06-07 DIAGNOSIS — R278 Other lack of coordination: Secondary | ICD-10-CM | POA: Diagnosis not present

## 2016-06-07 DIAGNOSIS — M6281 Muscle weakness (generalized): Secondary | ICD-10-CM | POA: Diagnosis not present

## 2016-06-07 DIAGNOSIS — R29898 Other symptoms and signs involving the musculoskeletal system: Secondary | ICD-10-CM

## 2016-06-07 DIAGNOSIS — R41841 Cognitive communication deficit: Secondary | ICD-10-CM

## 2016-06-07 DIAGNOSIS — R2689 Other abnormalities of gait and mobility: Secondary | ICD-10-CM

## 2016-06-07 DIAGNOSIS — R29818 Other symptoms and signs involving the nervous system: Secondary | ICD-10-CM

## 2016-06-07 DIAGNOSIS — R41844 Frontal lobe and executive function deficit: Secondary | ICD-10-CM

## 2016-06-07 DIAGNOSIS — R471 Dysarthria and anarthria: Secondary | ICD-10-CM | POA: Diagnosis not present

## 2016-06-07 DIAGNOSIS — R1312 Dysphagia, oropharyngeal phase: Secondary | ICD-10-CM

## 2016-06-07 NOTE — Telephone Encounter (Signed)
Agree. I appreciate the help of all involved. I will await cardiology notes. Thanks.

## 2016-06-07 NOTE — Therapy (Signed)
[facility] [address] [place], [place], [address] Phone: [phone]   Fax:  [phone]  Speech Language Pathology Treatment  Patient Details  Name: [name] MRN: [id number] Date of Birth: 1950-05-02 Referring Provider: Alonza Bogus, DO  Encounter Date: 06/07/2016      End of Session - 06/07/16 0930    Visit Number 6   Number of Visits 17   Date for SLP Re-Evaluation 07/22/16   SLP Start Time 0808   SLP Stop Time  0846   SLP Time Calculation (min) 38 min      Past Medical History:  Diagnosis Date  . Arthritis   . Bradycardia   . Cancer [facility]) 2013   skin cancer  . Depression    ptsd  . Dysrhythmia    chronic slow heart rate  . GERD (gastroesophageal reflux disease)   . Headache(784.0)    tension headaches non recent  . History of chicken pox   . History of kidney stones   . Hypertension    treated with HCTZ  . Parkinson's disease ([place])    dx'ed 15 years ago  . PTSD (post-traumatic stress disorder)   . Shortness of breath dyspnea   . Sleep apnea    doesn't use C-pap  . Varicose veins     Past Surgical History:  Procedure Laterality Date  . CHOLECYSTECTOMY N/A 10/22/2014   Procedure: LAPAROSCOPIC CHOLECYSTECTOMY WITH INTRAOPERATIVE CHOLANGIOGRAM;  Surgeon: Dia Crawford III, MD;  Location: [facility] ORS;  Service: General;  Laterality: N/A;  . cyst removed      from lip as a child  . LUMBAR LAMINECTOMY/DECOMPRESSION MICRODISCECTOMY Bilateral 12/14/2012   Procedure: Bilateral lumbar three-four, four-five decompressive laminotomy/foraminotomy;  Surgeon: Charlie Pitter, MD;  Location: [place] NEURO ORS;  Service: Neurosurgery;  Laterality: Bilateral;  . PULSE GENERATOR IMPLANT Bilateral 12/13/2013   Procedure: Bilateral implantable pulse generator placement;  Surgeon: Erline Levine, MD;  Location: [place] NEURO ORS;  Service: Neurosurgery;  Laterality: Bilateral;  Bilateral implantable pulse generator placement  .  skin cancer removed     from ears,   12 lft arm  rt leg 15  . SUBTHALAMIC STIMULATOR INSERTION Bilateral 12/06/2013   Procedure: SUBTHALAMIC STIMULATOR INSERTION;  Surgeon: Erline Levine, MD;  Location: [place] NEURO ORS;  Service: Neurosurgery;  Laterality: Bilateral;  Bilateral deep brain stimulator placement    There were no vitals filed for this visit.      Subjective Assessment - 06/07/16 0816    Subjective Pt reports he got all homework done.               ADULT SLP TREATMENT - 06/07/16 0818      General Information   Behavior/Cognition Alert;Cooperative;Pleasant mood     Treatment Provided   Treatment provided Cognitive-Linquistic     Dysphagia Treatment   Other treatment/comments Pt req'd min-mod A occasionally wiht dysphagia HEP. Pt reported he will commit to doing exercises. Pt does not follow swallow precautions regularly.     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 Pt reviewed homework with SLP mod cues usually for reduced rate of speech.SLP encouraged pt to record himself for 1-2 minutes of conversation and hten play back, as pt stated he didn't think he was talking with fast rate of speech.      Assessment / Recommendations / Plan   Plan Continue with current plan of  care     Progression Toward Goals   Progression toward goals Progressing toward goals          SLP Education - 06/07/16 0930    Education provided Yes   Education Details HEP for dysphagia   Person(s) Educated Patient;Spouse   Methods Explanation;Demonstration;Verbal cues;Handout   Comprehension Verbalized understanding;Returned demonstration;Verbal cues required;Need further instruction          SLP Short Term Goals - 06/07/16 0932      SLP SHORT TERM GOAL #1   Title pt will maintain speech volume appropriate for 80% intelligibilty in 3 minutes simple  conversation with rare min A   Time 1   Period Weeks   Status Revised     SLP SHORT TERM GOAL #2   Title pt will demo emergent awareness re: reduced speech loudness in conversation, by repeating hjimself spontaneously 20% of opportunities   Time 1   Period Weeks   Status Revised     SLP SHORT TERM GOAL #3   Title pt will demo knowledge of memory compensations during 3 therapy sessions   Time 1   Period Weeks   Status On-going     SLP SHORT TERM GOAL #4   Title pt will perform dysphagia HEP with occasional min cues over two sessions    Time 1   Period Weeks   Status Revised          SLP Long Term Goals - 06/07/16 [id number]      SLP LONG TERM GOAL #1   Title pt will maintain speech volume adequate to achieve at least 95% intelligibility in a noisy environment over 10 minutes of simple to mod complex conversation over three sessions   Time 5   Period Weeks   Status On-going     SLP LONG TERM GOAL #2   Title pt will demo emergent awareness of low speech volume in 10 minutes conversation by self-repeating in 75% of opportunities   Time 5   Period Weeks   Status On-going     SLP LONG TERM GOAL #3   Title pt will complete dysphagia HEP with rare min cues over three sessions   Time 5   Period Weeks   Status On-going          Plan - 06/07/16 0931    Clinical Impression Statement Today pt cont'd to demonstrate reduced communicative effectiveness primarily due to rushes of speech and reduced volume, with reduced awareness. With mod cueing, usually, awareness of decr'd speech clarity had not measurably improved. Pt's dysphagia HEP was also reviewed with him, requiring SLP cues Skilled ST remains needed to improve pt's speech clarity as well as strengthen his swallowing  musculature.Pt will need to consistently copmlete homework for functional progress to be seen.   Speech Therapy Frequency 2x / week   Duration --  8 weeks   Treatment/Interventions Compensatory  techniques;Cognitive reorganization;SLP instruction and feedback;Internal/external aids;Functional tasks;Patient/family education;Cueing hierarchy   Potential to Achieve Goals Good   Potential Considerations Ability to learn/carryover information;Cooperation/participation level;Severity of impairments   SLP Home Exercise Plan provided today   Consulted and Agree with Plan of Care Patient      Patient will benefit from skilled therapeutic intervention in order to improve the following deficits and impairments:   Dysarthria and anarthria  Cognitive communication deficit  Dysphagia, oropharyngeal    Problem List Patient Active Problem List   Diagnosis Date Noted  . Trochanteric bursitis 03/03/2016  . Left hand pain 10/30/2015  .  Fracture, finger, multiple sites 10/30/2015  . Colon cancer screening 07/23/2015  . Screening examination for infectious disease 07/23/2015  . Gout 02/18/2015  . Depression 01/06/2015  . Irritation of eyelid 08/20/2014  . Dupuytren's contracture 08/20/2014  . Cough 07/21/2014  . Lumbar stenosis with neurogenic claudication 06/13/2014  . Preop cardiovascular exam 05/28/2014  . S/P deep brain stimulator placement 05/08/2014  . PD (Parkinson's disease) ([place]) 05/08/2014  . Joint pain 01/22/2014  . Medicare annual wellness visit, initial 01/19/2014  . Advance care planning 01/19/2014  . Parkinson's disease ([place]) 12/13/2013  . PTSD (post-traumatic stress disorder) 06/13/2013  . Erectile dysfunction 06/13/2013  . HLD (hyperlipidemia) 06/13/2013  . Hip pain 06/10/2013  . Pain in joint, shoulder region 06/10/2013  . Right leg swelling 06/03/2013  . Essential hypertension 06/03/2013  . Bradycardia by electrocardiogram 06/03/2013  . Obstructive sleep apnea 03/12/2013  . Spinal stenosis, lumbar region, with neurogenic claudication 12/14/2012    [facility] ,[place], [place]  06/07/2016, 9:34 AM  [facility] [address] [place], [place], [address] Phone: [phone]   Fax:  [phone]   Name: DMITRIUS HENSLER MRN: [id number] Date of Birth: Nov 28, 1950

## 2016-06-07 NOTE — Therapy (Signed)
[facility] [address] [place], [place], [address] Phone: [phone]   Fax:  [phone]  Physical Therapy Treatment  Patient Details  Name: [name] MRN: [id number] Date of Birth: 10-25-1950 Referring Provider: Alonza Bogus  Encounter Date: 06/07/2016      PT End of Session - 06/07/16 2123    Visit Number 6   Number of Visits 13   Date for PT Re-Evaluation 07/05/16   Authorization Type [employer] Medicare PPO-GCODE every 10th visit   PT Start Time 0935   PT Stop Time 1017   PT Time Calculation (min) 42 min   Equipment Utilized During Treatment Gait belt   Activity Tolerance Patient tolerated treatment well   Behavior During Therapy [facility] for tasks assessed/performed      Past Medical History:  Diagnosis Date  . Arthritis   . Bradycardia   . Cancer [facility]) 2013   skin cancer  . Depression    ptsd  . Dysrhythmia    chronic slow heart rate  . GERD (gastroesophageal reflux disease)   . Headache(784.0)    tension headaches non recent  . History of chicken pox   . History of kidney stones   . Hypertension    treated with HCTZ  . Parkinson's disease ([place])    dx'ed 15 years ago  . PTSD (post-traumatic stress disorder)   . Shortness of breath dyspnea   . Sleep apnea    doesn't use C-pap  . Varicose veins     Past Surgical History:  Procedure Laterality Date  . CHOLECYSTECTOMY N/A 10/22/2014   Procedure: LAPAROSCOPIC CHOLECYSTECTOMY WITH INTRAOPERATIVE CHOLANGIOGRAM;  Surgeon: Dia Crawford III, MD;  Location: [facility] ORS;  Service: General;  Laterality: N/A;  . cyst removed      from lip as a child  . LUMBAR LAMINECTOMY/DECOMPRESSION MICRODISCECTOMY Bilateral 12/14/2012   Procedure: Bilateral lumbar three-four, four-five decompressive laminotomy/foraminotomy;  Surgeon: Charlie Pitter, MD;  Location: [place] NEURO ORS;  Service: Neurosurgery;  Laterality: Bilateral;  . PULSE GENERATOR IMPLANT Bilateral 12/13/2013   Procedure: Bilateral implantable pulse generator placement;  Surgeon: Erline Levine, MD;  Location: [place] NEURO ORS;  Service: Neurosurgery;  Laterality: Bilateral;  Bilateral implantable pulse generator placement  . skin cancer removed     from ears,   12 lft arm  rt leg 15  . SUBTHALAMIC STIMULATOR INSERTION Bilateral 12/06/2013   Procedure: SUBTHALAMIC STIMULATOR INSERTION;  Surgeon: Erline Levine, MD;  Location: [place] NEURO ORS;  Service: Neurosurgery;  Laterality: Bilateral;  Bilateral deep brain stimulator placement    There were no vitals filed for this visit.      Subjective Assessment - 06/07/16 0936    Subjective Went to ED due to low heartrate and not being able to wake up.  Not sure what is going on-saw cardiologist yesterday and I'm wearing heart monitor x 2 weeks.  No restrictions or precaution.  Two falls in garage, reaching over to pick up something, other time fell outside in driveway, not using walker.   Patient is accompained by: Family member  spouse   Patient Stated Goals Pt's goal for therapy is to help with legs and with balance.   Currently in Pain? Yes   Pain Score 3    Pain Location Shoulder   Pain Orientation Right   Pain Descriptors / Indicators Tightness   Pain Onset More than a month ago   Aggravating Factors  sitting back on it/putting pressue on it aggravates   Pain Relieving  Factors finding a good position   Effect of Pain on Daily Activities PT will monitor, but not address, due to this pain chronic per wife report     Asked about pain management-pt reports sometimes stretches help.  Have not used ice (ice massage) or IT band massage, as instructed earlier in POC.             Therapeutic Exercise: -Reviewed single knee to chest, x 2 reps, 30 seconds and long circle sit (modified butterfly sit), with cues for upright posture and optimal gentle stretch. -Supine lower trunk rotation 3 x 30 seconds each, then supine hip flexor stretch off edge of mat, 3 x  30-60 seconds, RLE -Seated hip abductor stretch, in cross-leg position, RLE, 3 x 30 seconds. -Pt continues to request to focus on lower extremity stretching.  Discussed importance of stretching, including technique (gentle stretch, not painful), 30-60 second hold time, 3 reps each stretch, 1-2 times per day, consistently.        [facility] Adult PT Treatment/Exercise - 06/07/16 1425      Standardized Balance Assessment   Standardized Balance Assessment Timed Up and Go Test     Timed Up and Go Test   TUG Normal TUG   Normal TUG (seconds) 25.97  26.82 sec using U-step RW     High Level Balance   High Level Balance Comments At counter:  modified standing PWR! Up, with UEs supported at counter, x 10 reps, modified PWR! Rock x 10 reps each side, with lateral weightshifting, followed by lateral weightshifting and reaching x 10 reps; lateral weightshifting with alternating forward reach x 10 reps; stagger stance anterior/posterior weightshifting x 10 reps each foot position, with UE support, progressing to rock and reach with 1 UE support.     Self-Care   Self-Care Other Self-Care Comments   Other Self-Care Comments  Discussed POC, including checking STGs, with progression with goals, including improved safety noted with use of rollator walker.  Verbally reviewed technique for floor>stand transfer from previous visit, with pt verbalizing understanding.                PT Education - 06/07/16 2120    Education provided Yes   Education Details HEP-stretches-see instructions   Person(s) Educated Patient;Spouse   Methods Explanation;Demonstration;Handout   Comprehension Verbalized understanding;Returned demonstration;Verbal cues required          PT Short Term Goals - 06/07/16 0941      PT SHORT TERM GOAL #1   Title Pt will perform HEP with family's supervision, for improved balance, transfers, and gait.  TARGET 06/08/16   Baseline Pt has been instructed in HEP for lower extremity  stretches, and appears to be performing.   Time 4   Period Weeks   Status Achieved     PT SHORT TERM GOAL #2   Title Pt will perform 5x sit<>stand in less than or equal to 15 seconds for improved efficiency and safety with transfers.   Baseline 5x sit<>stand 18.75 sec- 06/07/16   Time 4   Status Not Met     PT SHORT TERM GOAL #3   Title Pt will improve TUG score to less than or equal to 13.5 seconds for decreased fall risk.   Baseline TUG with rollator25.87 sec 06/07/16   Time 4   Period Weeks   Status Not Met     PT SHORT TERM GOAL #4   Title Pt will perform floor>stand transfer with min assist of family member for improved  fall recovery.   Baseline min guard assistance iwth UE support 06/01/16 visit   Time 4   Period Weeks   Status Achieved     PT SHORT TERM GOAL #5   Title Pt will verbalize at least 3 means to decrease pain along R lateral thigh/iliotibial band.   Time 4   Period Weeks   Status On-going           PT Long Term Goals - 05/09/16 2124      PT LONG TERM GOAL #1   Title Pt/family will verabalize understanding of fall prevention/tips to reduce freezing with gait in the home environment.  TARGET 07/07/16   Time 6   Period Weeks   Status New     PT LONG TERM GOAL #2   Title Pt will improve gait velocity to at least 2.62 ft/sec for improved community ambulator status (with appropriate assistive device).   Time 6   Period Weeks   Status New     PT LONG TERM GOAL #3   Title Pt will report at least 25% improvement in performance of low surface transfers for improved ability to get in and out of car as well as restaraunt seating.    Time 6   Period Weeks   Status New     PT LONG TERM GOAL #4   Title Pt/wife will verbalize understanding of plans for continued community fitness upon D/C from PT.   Time 6   Period Weeks   Status New     PT LONG TERM GOAL #5   Title Pt will demonstrate proper use and technique of U-step RW for improved household and  community gait.   Time 6   Period Weeks   Status New               Plan - 06/07/16 2123    Clinical Impression Statement Assessed STGs today, with pt meeting STG 1 and 4.  STG 2 and 3 not met for 5x sit<>stand or TUG, but pt appears much safer overall with U-step RW, with slower, more controlled gait pattern.  Pt's HEP has focused mainly on lower extremity/hip stretches to address pt's hip pain.  Pt continues to have decreased stability with dynamic standing when unsupported. Pt will continue to benefit from skilled PT to address balance, posture, gait.   Rehab Potential Good   PT Frequency 2x / week   PT Duration 6 weeks   PT Treatment/Interventions ADLs/Self Care Home Management;Functional mobility training;Gait training;DME Instruction;Neuromuscular re-education;Patient/family education;Balance training;Therapeutic exercise;Therapeutic activities;Manual techniques   PT Next Visit Plan Check on use of U-step walker (agrees to use at all times thru 2/27), review HEP given 2/204;  additional postural stability, balance activities (this is week 4 of 6 in POC)   Consulted and Agree with Plan of Care Patient;Family member/caregiver   Family Member Consulted spouse      Patient will benefit from skilled therapeutic intervention in order to improve the following deficits and impairments:  Abnormal gait, Decreased balance, Decreased mobility, Decreased knowledge of use of DME, Decreased safety awareness, Decreased strength, Difficulty walking, Postural dysfunction, Pain  Visit Diagnosis: Abnormal posture  Unsteadiness on feet  Other abnormalities of gait and mobility     Problem List Patient Active Problem List   Diagnosis Date Noted  . Trochanteric bursitis 03/03/2016  . Left hand pain 10/30/2015  . Fracture, finger, multiple sites 10/30/2015  . Colon cancer screening 07/23/2015  . Screening examination for infectious disease 07/23/2015  .  Gout 02/18/2015  . Depression  01/06/2015  . Irritation of eyelid 08/20/2014  . Dupuytren's contracture 08/20/2014  . Cough 07/21/2014  . Lumbar stenosis with neurogenic claudication 06/13/2014  . Preop cardiovascular exam 05/28/2014  . S/P deep brain stimulator placement 05/08/2014  . PD (Parkinson's disease) ([place]) 05/08/2014  . Joint pain 01/22/2014  . Medicare annual wellness visit, initial 01/19/2014  . Advance care planning 01/19/2014  . Parkinson's disease ([place]) 12/13/2013  . PTSD (post-traumatic stress disorder) 06/13/2013  . Erectile dysfunction 06/13/2013  . HLD (hyperlipidemia) 06/13/2013  . Hip pain 06/10/2013  . Pain in joint, shoulder region 06/10/2013  . Right leg swelling 06/03/2013  . Essential hypertension 06/03/2013  . Bradycardia by electrocardiogram 06/03/2013  . Obstructive sleep apnea 03/12/2013  . Spinal stenosis, lumbar region, with neurogenic claudication 12/14/2012    MARRIOTT,[name] W. 06/07/2016, 9:27 PM  Frazier Butt., PT  [facility] [address] [place], [place], [address] Phone: [phone]   Fax:  [phone]  Name: [name] MRN: [id number] Date of Birth: 1950-10-03

## 2016-06-07 NOTE — Patient Instructions (Addendum)
Hip Stretch    Put right ankle over left knee. Sit tall with your best posture.  Let right knee fall downward, but keep ankle in place. Feel the gentle stretch in hip. May push down gently with hand to feel stretch. Hold _15-30___ seconds while counting out loud. Repeat with other leg. Repeat __3__ times. Do __1-2__ sessions per day.  http://gt2.exer.us/497   Copyright  VHI. All rights reserved.  Hip Flexor Stretch    Lying on back near edge of sofa, bend one leg, foot flat. Hang right leg over edge, relaxed, thigh resting entirely on bed for _30-60___ seconds (the right foot should be resting on the floor). Repeat _3___ times. Do __1-2__ sessions per day.   http://gt2.exer.us/346   Copyright  VHI. All rights reserved.

## 2016-06-07 NOTE — Patient Instructions (Signed)
Record yourself and see if you are speaking quickly. Make sure to give yourself about 15-20 minutes before listening back.
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
## 2016-06-10 ENCOUNTER — Ambulatory Visit: Payer: Medicare PPO

## 2016-06-10 ENCOUNTER — Ambulatory Visit: Payer: Medicare PPO | Admitting: Physical Therapy

## 2016-06-10 VITALS — BP 150/71 | HR 41

## 2016-06-10 DIAGNOSIS — R41841 Cognitive communication deficit: Secondary | ICD-10-CM | POA: Diagnosis not present

## 2016-06-10 DIAGNOSIS — R29818 Other symptoms and signs involving the nervous system: Secondary | ICD-10-CM | POA: Diagnosis not present

## 2016-06-10 DIAGNOSIS — R293 Abnormal posture: Secondary | ICD-10-CM

## 2016-06-10 DIAGNOSIS — R471 Dysarthria and anarthria: Secondary | ICD-10-CM

## 2016-06-10 DIAGNOSIS — R2681 Unsteadiness on feet: Secondary | ICD-10-CM | POA: Diagnosis not present

## 2016-06-10 DIAGNOSIS — R278 Other lack of coordination: Secondary | ICD-10-CM | POA: Diagnosis not present

## 2016-06-10 DIAGNOSIS — R2689 Other abnormalities of gait and mobility: Secondary | ICD-10-CM

## 2016-06-10 DIAGNOSIS — M6281 Muscle weakness (generalized): Secondary | ICD-10-CM | POA: Diagnosis not present

## 2016-06-10 DIAGNOSIS — R1312 Dysphagia, oropharyngeal phase: Secondary | ICD-10-CM

## 2016-06-10 DIAGNOSIS — R41844 Frontal lobe and executive function deficit: Secondary | ICD-10-CM | POA: Diagnosis not present

## 2016-06-10 NOTE — Patient Instructions (Signed)
Signs of Aspiration Pneumonia   . Chest pain/tightness . Fever (can be low grade) . Cough  o With foul-smelling phlegm (sputum) o With sputum containing pus or blood o With greenish sputum . Fatigue  . Shortness of breath  . Wheezing   **IF YOU HAVE THESE SIGNS, CONTACT YOUR DOCTOR OR GO TO THE EMERGENCY DEPARTMENT OR URGENT CARE AS SOON AS POSSIBLE**      
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
## 2016-06-10 NOTE — Therapy (Signed)
[facility] [address] [place], [place], [address] Phone: [phone]   Fax:  [phone]  Speech Language Pathology Treatment  Patient Details  Name: [name] MRN: [id number] Date of Birth: [birth year] Referring Provider: Kerin Salen, DO  Encounter Date: 06/10/2016      End of Session - 06/10/16 1306    Visit Number 7   Number of Visits 17   Date for SLP Re-Evaluation 07/22/16   SLP Start Time 1152   SLP Stop Time  1231   SLP Time Calculation (min) 39 min   Activity Tolerance Patient tolerated treatment well      Past Medical History:  Diagnosis Date  . Arthritis   . Bradycardia   . Cancer [facility]) 2013   skin cancer  . Depression    ptsd  . Dysrhythmia    chronic slow heart rate  . GERD (gastroesophageal reflux disease)   . Headache(784.0)    tension headaches non recent  . History of chicken pox   . History of kidney stones   . Hypertension    treated with HCTZ  . Parkinson's disease (HCC)    dx'ed 15 years ago  . PTSD (post-traumatic stress disorder)   . Shortness of breath dyspnea   . Sleep apnea    doesn't use C-pap  . Varicose veins     Past Surgical History:  Procedure Laterality Date  . CHOLECYSTECTOMY N/A 10/22/2014   Procedure: LAPAROSCOPIC CHOLECYSTECTOMY WITH INTRAOPERATIVE CHOLANGIOGRAM;  Surgeon: Tiney Rouge III, MD;  Location: [facility] ORS;  Service: General;  Laterality: N/A;  . cyst removed      from lip as a child  . LUMBAR LAMINECTOMY/DECOMPRESSION MICRODISCECTOMY Bilateral 12/14/2012   Procedure: Bilateral lumbar three-four, four-five decompressive laminotomy/foraminotomy;  Surgeon: Temple Pacini, MD;  Location: [facility];  Service: Neurosurgery;  Laterality: Bilateral;  . PULSE GENERATOR IMPLANT Bilateral 12/13/2013   Procedure: Bilateral implantable pulse generator placement;  Surgeon: Maeola Harman, MD;  Location: [facility];  Service: Neurosurgery;  Laterality: Bilateral;   Bilateral implantable pulse generator placement  . skin cancer removed     from ears,   12 lft arm  rt leg 15  . SUBTHALAMIC STIMULATOR INSERTION Bilateral 12/06/2013   Procedure: SUBTHALAMIC STIMULATOR INSERTION;  Surgeon: Maeola Harman, MD;  Location: [facility];  Service: Neurosurgery;  Laterality: Bilateral;  Bilateral deep brain stimulator placement    There were no vitals filed for this visit.      Subjective Assessment - 06/10/16 1204    Subjective Pt reports he has been consistent with speech homework.   Patient is accompained by: Family member  wife   Currently in Pain? No/denies   Pain Score 3    Pain Location Shoulder   Pain Orientation Mid   Pain Frequency Constant   Aggravating Factors  standing, not moving   Pain Relieving Factors moving back and shoulders   Effect of Pain on Daily Activities when later in the day, harder to go sit>stand               ADULT SLP TREATMENT - 06/10/16 1209      General Information   Behavior/Cognition Alert;Cooperative;Pleasant mood     Treatment Provided   Treatment provided Cognitive-Linquistic     Dysphagia Treatment   Temperature Spikes Noted No   Respiratory Status --   Treatment Methods Compensation strategy training;Patient/caregiver education;Skilled observation   Patient observed directly with PO's Yes   Type of PO's  observed Dysphagia 3 (soft);Thin liquids;Regular   Liquids provided via Cup   Pharyngeal Phase Signs & Symptoms Immediate cough;Audible swallow   Other treatment/comments Pt was presented with cereal bar, mini-peanut butter cup, and water. Pt put down cup, cereal bar between presentations, and took peanut butter cup in two bites. However when SLP engaged pt in conversation pt took too large a sip, swallowed audibly, and coughed after second swallow. SLP eduated/re-educated pt on no distractions while eating and provided rationale. SLP worked through [employer] exercise with pt as he had questions.       Cognitive-Linquistic Treatment   Treatment focused on Dysarthria   Skilled Treatment During explanations of food types, dinner setting, questions re: dysphagia HEP SLP req'd to consistently cue pt to slow his rate of speech. Pt slowed rate for approx 3-4 seconds and then rate incr'd back to his normal fast rate which reduced intelligibility.     Assessment / Recommendations / Plan   Plan Continue with current plan of care          SLP Education - 06/10/16 1306    Education provided Yes   Education Details HEP, s/s aspiration PNA, no distractions with meals   Person(s) Educated Patient   Methods Explanation;Handout;Verbal cues;Tactile cues   Comprehension Verbalized understanding;Returned demonstration;Need further instruction          SLP Short Term Goals - 06/10/16 1308      SLP SHORT TERM GOAL #1   Title pt will maintain speech volume appropriate for 80% intelligibilty in 3 minutes simple conversation with rare min A   Time 1   Period Weeks   Status Not Met     SLP SHORT TERM GOAL #2   Title pt will demo emergent awareness re: reduced speech loudness in conversation, by repeating hjimself spontaneously 20% of opportunities   Time 1   Period Weeks   Status Not Met     SLP SHORT TERM GOAL #3   Title pt will demo knowledge of memory compensations during 3 therapy sessions   Time 1   Period Weeks   Status Deferred  due to focus on swallowing and speeech     SLP SHORT TERM GOAL #4   Title pt will perform dysphagia HEP with occasional min cues over two sessions    Status Partially Met  one session, not two          SLP Long Term Goals - 06/10/16 1309      SLP LONG TERM GOAL #1   Title pt will maintain speech volume adequate to achieve at least 90% intelligibility in a noisy environment over 10 minutes of simple complex conversation over two sessions   Time 5   Period Weeks   Status Revised     SLP LONG TERM GOAL #2   Title pt will demo emergent awareness  of reduced intelligibility in 10 minutes conversation by self-repeating in 50% of opportunities   Time 5   Period Weeks   Status Revised     SLP LONG TERM GOAL #3   Title pt will complete dysphagia HEP with rare min cues over three sessions   Time 5   Period Weeks   Status On-going          Plan - 06/10/16 1307    Clinical Impression Statement Today pt demonstrated reduced communicative effectiveness due to decr'd speech loudness and rushes of speech with reduced awareness in conversation. SLP A to reduce rate was minimally successful. See "  education" section for education completed. Pt also was seen with POs today with above observations/intervention noted above in "skilled intervention". Skilled ST remains needed to improve pt's speech clarity as well as strengthen his swallowing  musculature.   Speech Therapy Frequency 2x / week   Duration --  8 weeks   Treatment/Interventions Compensatory techniques;Cognitive reorganization;SLP instruction and feedback;Internal/external aids;Functional tasks;Patient/family education;Cueing hierarchy   Potential to Achieve Goals Good   Potential Considerations Ability to learn/carryover information;Cooperation/participation level;Severity of impairments   SLP Home Exercise Plan provided today   Consulted and Agree with Plan of Care Patient      Patient will benefit from skilled therapeutic intervention in order to improve the following deficits and impairments:   Dysarthria and anarthria  Cognitive communication deficit  Dysphagia, oropharyngeal    Problem List Patient Active Problem List   Diagnosis Date Noted  . Trochanteric bursitis 03/03/2016  . Left hand pain 10/30/2015  . Fracture, finger, multiple sites 10/30/2015  . Colon cancer screening 07/23/2015  . Screening examination for infectious disease 07/23/2015  . Gout 02/18/2015  . Depression 01/06/2015  . Irritation of eyelid 08/20/2014  . Dupuytren's contracture 08/20/2014   . Cough 07/21/2014  . Lumbar stenosis with neurogenic claudication 06/13/2014  . Preop cardiovascular exam 05/28/2014  . S/P deep brain stimulator placement 05/08/2014  . PD (Parkinson's disease) ([place]) 05/08/2014  . Joint pain 01/22/2014  . Medicare annual wellness visit, initial 01/19/2014  . Advance care planning 01/19/2014  . Parkinson's disease ([place]) 12/13/2013  . PTSD (post-traumatic stress disorder) 06/13/2013  . Erectile dysfunction 06/13/2013  . HLD (hyperlipidemia) 06/13/2013  . Hip pain 06/10/2013  . Pain in joint, shoulder region 06/10/2013  . Right leg swelling 06/03/2013  . Essential hypertension 06/03/2013  . Bradycardia by electrocardiogram 06/03/2013  . Obstructive sleep apnea 03/12/2013  . Spinal stenosis, lumbar region, with neurogenic claudication 12/14/2012    [facility] ,MS, CCC-SLP  06/10/2016, 1:11 PM  [facility] [address] [place], [place], [address] Phone: [phone]   Fax:  [phone]   Name: [name] MRN: [id number] Date of Birth: February 20, 1951

## 2016-06-14 ENCOUNTER — Ambulatory Visit: Payer: Medicare PPO | Admitting: Occupational Therapy

## 2016-06-14 ENCOUNTER — Ambulatory Visit: Payer: Medicare PPO | Admitting: Physical Therapy

## 2016-06-14 ENCOUNTER — Ambulatory Visit: Payer: Medicare PPO | Admitting: *Deleted

## 2016-06-14 DIAGNOSIS — R2689 Other abnormalities of gait and mobility: Secondary | ICD-10-CM

## 2016-06-14 DIAGNOSIS — R471 Dysarthria and anarthria: Secondary | ICD-10-CM

## 2016-06-14 DIAGNOSIS — R2681 Unsteadiness on feet: Secondary | ICD-10-CM | POA: Diagnosis not present

## 2016-06-14 DIAGNOSIS — R41841 Cognitive communication deficit: Secondary | ICD-10-CM | POA: Diagnosis not present

## 2016-06-14 DIAGNOSIS — R29818 Other symptoms and signs involving the nervous system: Secondary | ICD-10-CM | POA: Diagnosis not present

## 2016-06-14 DIAGNOSIS — R41844 Frontal lobe and executive function deficit: Secondary | ICD-10-CM

## 2016-06-14 DIAGNOSIS — R278 Other lack of coordination: Secondary | ICD-10-CM | POA: Diagnosis not present

## 2016-06-14 DIAGNOSIS — M6281 Muscle weakness (generalized): Secondary | ICD-10-CM

## 2016-06-14 DIAGNOSIS — R293 Abnormal posture: Secondary | ICD-10-CM | POA: Diagnosis not present

## 2016-06-14 NOTE — Therapy (Signed)
[facility] [address] [place], [place], [address] Phone: [phone]   Fax:  [phone]  Occupational Therapy Treatment  Patient Details  Name: [name] MRN: [id number] Date of Birth: [birth year] Referring Provider: Dr. Wells Guiles Tat  Encounter Date: 06/14/2016      OT End of Session - 06/14/16 1246    Visit Number 9   Number of Visits 17   Date for OT Re-Evaluation 07/07/16   Authorization Type [employer] / [employer], no auth/visit limit, G-code needed   Authorization Time Period G code next visit   Authorization - Visit Number 9   Authorization - Number of Visits 10   OT Start Time 1018   OT Stop Time 1100   OT Time Calculation (min) 42 min   Activity Tolerance Patient tolerated treatment well   Behavior During Therapy [facility] for tasks assessed/performed      Past Medical History:  Diagnosis Date  . Arthritis   . Bradycardia   . Cancer [facility]) 2013   skin cancer  . Depression    ptsd  . Dysrhythmia    chronic slow heart rate  . GERD (gastroesophageal reflux disease)   . Headache(784.0)    tension headaches non recent  . History of chicken pox   . History of kidney stones   . Hypertension    treated with HCTZ  . Parkinson's disease ([place])    dx'ed 15 years ago  . PTSD (post-traumatic stress disorder)   . Shortness of breath dyspnea   . Sleep apnea    doesn't use C-pap  . Varicose veins     Past Surgical History:  Procedure Laterality Date  . CHOLECYSTECTOMY N/A 10/22/2014   Procedure: LAPAROSCOPIC CHOLECYSTECTOMY WITH INTRAOPERATIVE CHOLANGIOGRAM;  Surgeon: Dia Crawford III, MD;  Location: [facility] ORS;  Service: General;  Laterality: N/A;  . cyst removed      from lip as a child  . LUMBAR LAMINECTOMY/DECOMPRESSION MICRODISCECTOMY Bilateral 12/14/2012   Procedure: Bilateral lumbar three-four, four-five decompressive laminotomy/foraminotomy;  Surgeon: Charlie Pitter, MD;  Location: [place] [facility] OR[facility];   Service: Neurosurgery;  Laterality: Bilateral;  . PULSE GENERATOR IMPLANT Bilateral 12/13/2013   Procedure: Bilateral implantable pulse generator placement;  Surgeon: Erline Levine, MD;  Location: [place] NEUR[facility] ORS;  Service: Neurosurgery;  Laterality: Bilateral;  Bilateral implantable pulse generator placement  . skin cancer removed     from ears,   12 lft arm  rt leg 15  . SUBTHALAMIC STIMULATOR INSERTION Bilateral 12/06/2013   Procedure: SUBTHALAMIC STIMULATOR INSERTION;  Surgeon: Erline Levine, MD;  Location: [place] NEUR[facility] ORS;  Service: Neurosurgery;  Laterality: Bilateral;  Bilateral deep brain stimulator placement    There were no vitals filed for this visit.      Subjective Assessment - 06/14/16 1022    Pertinent History Parkinson's diagnosis approx 17 yrs ago, hx of 3 back surgeries (last approx 2 yrs ago), bilateral DBS placement approx 3 yrs ago, multiple falls, R 3rd digit fx 10/2015, HTN, bradycardia, PTSD, lumbar stenosis, HDL, Dupuytren contracture, depression, gout    Limitations Bilateral Deep Brain Stimulator; Fall risk   Patient Stated Goals safety per wife   Currently in Pain? Yes   Pain Score 3    Pain Location Back   Pain Type Chronic pain   Pain Onset More than a month ago   Pain Frequency Intermittent   Aggravating Factors  malpositioning   Pain Relieving Factors repositioning   Multiple Pain Sites No  Functional step and reach forward/back with functional reach for improve wt. Shift for ADLs with focus/min-mod cueing (verbal/tactile) for upright posture/avoiding hyperextension of trunk, R knee ext and controlled wt. Shift.  CGA for balance. Performed with each UE/LE.    Self Care: continued checking progress towards goals and discussing progress.  See below for status.  Reviewed the following education:  recommendation that pt should use U-step at all times for safety/balance and should not carry items when walking   Pt advised that walking without U-step,  carrying items while walking, and turning/walking in small spaces incr risk for fall.  Recommended that pt carry items by placing on seat of U-step.  Discussed that if pt cannot use U-step it is probably not safe to do task.  Pt/wife verbalized understanding. Arm bike x 6 mins level 1 pt maintained 40 rpm Reviewed PWR! Up in seated followed by controlled sit to stand and PWR! Hands Pt donned / doffed jacket in seated with adpated technique, min v.c.                    OT Short Term Goals - 06/14/16 1030      OT SHORT TERM GOAL #1   Title Pt/caregiver will be independent with PD-specific HEP.--check STGs 06/09/16   Time 4   Period Weeks   Status On-going     OT SHORT TERM GOAL #2   Title Pt will improve coordination for ADLs as shown by improving time on 9-hole peg test by at least 5sec with dominant RUE.   Baseline 60.56sec   Time 4   Period Weeks   Status Achieved  06/07/16:  46.97sec after PWR! hands (no change without PWR! hands)     OT SHORT TERM GOAL #3   Title Pt will improve coordination/functional reaching for ADLs as shown by improving box and blocks score by at least 4 bilaterally.   Baseline R-29, L-33 blocks   Time 4   Period Weeks   Status On-going  06/07/16:  R-26 blocks, L-33blocks      OT SHORT TERM GOAL #4   Title Pt will incr ease with eating as shown by completing PPT#2 in 15sec or less.   Baseline 17.53sec   Time 4   Period Weeks   Status Partially Met  16.09 secs, 14.50 secs(met second trial)     OT SHORT TERM GOAL #5   Title Pt/caregiver will verbalize ways to prevent future complications and appropriate community resources prn.   Time 4   Period Weeks   Status Achieved           OT Long Term Goals - 05/10/16 0944      OT LONG TERM GOAL #1   Title Pt/caregiver will verbalize understanding of adaptive strategies/equipment to incr ease/safety/independence with ADLs/IADLs.--check LTGs 07/07/16   Baseline -----   Time 8   Period  Weeks   Status New     OT LONG TERM GOAL #2   Title Pt will improve coordination for ADLs as shown by improving time on 9-hole peg test by at least 10sec with dominant RUE.   Baseline 60.56sec   Time 8   Period Weeks   Status New     OT LONG TERM GOAL #3   Title Pt will improve coordination/functional reaching for ADLs as shown by improving box and blocks score by at least 8 with RUE.   Baseline R-29 blocks   Time 8   Period Weeks   Status New  OT LONG TERM GOAL #4   Title Pt will fasten/unfasten 3 buttons in less than 45sec for incr ease with dressing.   Baseline 53.0sec   Time 8   Period Weeks   Status New     OT LONG TERM GOAL #5   Title Pt will improve balance for ADLs/IADLs as shown by improving standing functional reach test by at least 2 inches with LUE.   Baseline R-8", L-6"   Time 8   Period Weeks   Status New     OT LONG TERM GOAL #6   Title ---------   Baseline -------     OT LONG TERM GOAL #7   Title -----   Baseline ------               Plan - 06/14/16 1244    Clinical Impression Statement Pt is progressing towards goals. He demonstrates improved sit to stand using PWR! up.   Rehab Potential Fair   Clinical Impairments Affecting Rehab Potential cognitive deficits, severity of deficits, hip pain   OT Frequency 2x / week   OT Duration 8 weeks   OT Treatment/Interventions Self-care/ADL training;Moist Heat;Cryotherapy;Therapeutic exercise;Parrafin;DME and/or AE instruction;Therapist, nutritional;Therapeutic activities;Patient/family education;Balance training;Cognitive remediation/compensation;Splinting;Manual Therapy;Neuromuscular education;Ultrasound;Fluidtherapy;Energy conservation;Passive range of motion;Therapeutic exercises   Plan review supine PWR! and check off short term goal 1.   OT Home Exercise Plan PWR! supine, basic coordination(cards only), PWR! up seated   Consulted and Agree with Plan of Care Patient;Family member/caregiver    Family Member Consulted wife      Patient will benefit from skilled therapeutic intervention in order to improve the following deficits and impairments:  Decreased mobility, Decreased balance, Abnormal gait, Pain, Impaired tone, Impaired perceived functional ability, Decreased knowledge of precautions, Decreased coordination, Decreased activity tolerance, Decreased cognition, Decreased knowledge of use of DME, Decreased safety awareness, Decreased strength, Impaired UE functional use, Improper spinal/pelvic alignment, Improper body mechanics  Visit Diagnosis: Other lack of coordination  Frontal lobe and executive function deficit  Muscle weakness (generalized)  Other symptoms and signs involving the nervous system  Other abnormalities of gait and mobility    Problem List Patient Active Problem List   Diagnosis Date Noted  . Trochanteric bursitis 03/03/2016  . Left hand pain 10/30/2015  . Fracture, finger, multiple sites 10/30/2015  . Colon cancer screening 07/23/2015  . Screening examination for infectious disease 07/23/2015  . Gout 02/18/2015  . Depression 01/06/2015  . Irritation of eyelid 08/20/2014  . Dupuytren's contracture 08/20/2014  . Cough 07/21/2014  . Lumbar stenosis with neurogenic claudication 06/13/2014  . Preop cardiovascular exam 05/28/2014  . S/P deep brain stimulator placement 05/08/2014  . PD (Parkinson's disease) ([place]) 05/08/2014  . Joint pain 01/22/2014  . Medicare annual wellness visit, initial 01/19/2014  . Advance care planning 01/19/2014  . Parkinson's disease ([place]) 12/13/2013  . PTSD (post-traumatic stress disorder) 06/13/2013  . Erectile dysfunction 06/13/2013  . HLD (hyperlipidemia) 06/13/2013  . Hip pain 06/10/2013  . Pain in joint, shoulder region 06/10/2013  . Right leg swelling 06/03/2013  . Essential hypertension 06/03/2013  . Bradycardia by electrocardiogram 06/03/2013  . Obstructive sleep apnea 03/12/2013  . Spinal stenosis,  lumbar region, with neurogenic claudication 12/14/2012    Kahlil Cowans 06/14/2016, 12:50 PM  [facility] [address] [place], [place], [address] Phone: [phone]   Fax:  [phone]  Name: [name] MRN: [id number] Date of Birth: February 05, 1951

## 2016-06-14 NOTE — Therapy (Signed)
[facility] [address] [place], [place], [address] Phone: [phone]   Fax:  [phone]  Speech Language Pathology Treatment  Patient Details  Name: [name] MRN: [id number] Date of Birth: 1950-09-28 Referring Provider: Alonza Bogus, DO  Encounter Date: 06/14/2016      End of Session - 06/14/16 1220    Visit Number 8   Number of Visits 17   Date for SLP Re-Evaluation 07/22/16   SLP Start Time 0930   SLP Stop Time  1015   SLP Time Calculation (min) 45 min   Activity Tolerance Patient tolerated treatment well      Past Medical History:  Diagnosis Date  . Arthritis   . Bradycardia   . Cancer [facility]) 2013   skin cancer  . Depression    ptsd  . Dysrhythmia    chronic slow heart rate  . GERD (gastroesophageal reflux disease)   . Headache(784.0)    tension headaches non recent  . History of chicken pox   . History of kidney stones   . Hypertension    treated with HCTZ  . Parkinson's disease ([place])    dx'ed 15 years ago  . PTSD (post-traumatic stress disorder)   . Shortness of breath dyspnea   . Sleep apnea    doesn't use C-pap  . Varicose veins     Past Surgical History:  Procedure Laterality Date  . CHOLECYSTECTOMY N/A 10/22/2014   Procedure: LAPAROSCOPIC CHOLECYSTECTOMY WITH INTRAOPERATIVE CHOLANGIOGRAM;  Surgeon: Dia Crawford III, MD;  Location: [facility] ORS;  Service: General;  Laterality: N/A;  . cyst removed      from lip as a child  . LUMBAR LAMINECTOMY/DECOMPRESSION MICRODISCECTOMY Bilateral 12/14/2012   Procedure: Bilateral lumbar three-four, four-five decompressive laminotomy/foraminotomy;  Surgeon: Charlie Pitter, MD;  Location: [place] NEURO ORS;  Service: Neurosurgery;  Laterality: Bilateral;  . PULSE GENERATOR IMPLANT Bilateral 12/13/2013   Procedure: Bilateral implantable pulse generator placement;  Surgeon: Erline Levine, MD;  Location: [place] NEURO ORS;  Service: Neurosurgery;  Laterality: Bilateral;   Bilateral implantable pulse generator placement  . skin cancer removed     from ears,   12 lft arm  rt leg 15  . SUBTHALAMIC STIMULATOR INSERTION Bilateral 12/06/2013   Procedure: SUBTHALAMIC STIMULATOR INSERTION;  Surgeon: Erline Levine, MD;  Location: [place] NEURO ORS;  Service: Neurosurgery;  Laterality: Bilateral;  Bilateral deep brain stimulator placement    There were no vitals filed for this visit.      Subjective Assessment - 06/14/16 1026    Subjective Wife reports "she is making a lot of sense with her description" re: "plate glass communication" and overarticulation.   Patient is accompained by: Family member   Currently in Pain? Yes   Pain Score 3    Pain Location Shoulder               ADULT SLP TREATMENT - 06/14/16 0001      General Information   Behavior/Cognition Alert;Cooperative;Pleasant mood     Treatment Provided   Treatment provided Cognitive-Linquistic     Pain Assessment   Pain Assessment 0-10   Pain Score 3    Pain Location shoulders     Cognitive-Linquistic Treatment   Treatment focused on Dysarthria;Patient/family/caregiver education   Skilled Treatment Skilled ST session targeted review of home exercise program for dysphagia/dysarthria, discussion of s/s aspiration PNA  (independent referral to handouts given during earlier session). Mod cues required to verbalize safe swallow strategies. Pt reports intermittent reflux.  SLP reviewed and provided written strategies for behavioral and dietary strategies for management of esophageal dysmotility. Review of the Heimlich maneuver was reviewed with pt and wife, as he reports getting choked in restaurants a couple times. Both indicate they know how to implement the maneuver. Pt was encouraged to overarticulate speech sounds to decrease rate and increase intelligibility, as if his listener was standing on the other side of a window and had to rely on lip reading. Pt able to implement these strategies one sentence  at a time, but quickly goes back to rapid rate and minimal mouth movement without verbal cues.      Assessment / Recommendations / Plan   Plan Continue with current plan of care     Progression Toward Goals   Progression toward goals Progressing toward goals          SLP Education - 06/14/16 1219    Education provided Yes   Education Details Overarticulation of lips/tongue, safe swallow precautions, hep   Person(s) Educated Patient;Spouse   Methods Explanation;Demonstration;Verbal cues   Comprehension Verbalized understanding;Returned demonstration;Need further instruction;Verbal cues required          SLP Short Term Goals - 06/14/16 1223      SLP SHORT TERM GOAL #1   Title pt will maintain speech volume appropriate for 80% intelligibilty in 3 minutes simple conversation with rare min A   Status Not Met     SLP SHORT TERM GOAL #2   Title pt will demo emergent awareness re: reduced speech loudness in conversation, by repeating hjimself spontaneously 20% of opportunities   Status Not Met     SLP SHORT TERM GOAL #3   Title pt will demo knowledge of memory compensations during 3 therapy sessions   Status Deferred     SLP SHORT TERM GOAL #4   Title pt will perform dysphagia HEP with occasional min cues over two sessions    Status Partially Met          SLP Long Term Goals - 06/14/16 1224      SLP LONG TERM GOAL #1   Title pt will maintain speech volume adequate to achieve at least 90% intelligibility in a noisy environment over 10 minutes of simple complex conversation over two sessions   Time 4   Period Weeks   Status On-going     SLP LONG TERM GOAL #2   Title pt will demo emergent awareness of reduced intelligibility in 10 minutes conversation by self-repeating in 50% of opportunities   Time 4   Period Weeks   Status On-going     SLP LONG TERM GOAL #3   Title pt will complete dysphagia HEP with rare min cues over three sessions   Time 4   Period Weeks    Status On-going          Plan - 06/14/16 1220    Clinical Impression Statement Pt pleasant and cooperative with unfamiliar therapist. Mod+ cues required for decreased rate of speech. Pt recalled HEP, s/s aspiration, and safe swallow strategies with min cues. Pt was encouraged to practice "plate glass communication" at home, in front of the mirror, as often as possible for improvement of speech clarity.   Speech Therapy Frequency 2x / week   Duration --  8 weeks   Treatment/Interventions Compensatory techniques;Cognitive reorganization;SLP instruction and feedback;Internal/external aids;Functional tasks;Patient/family education;Cueing hierarchy;Oral motor exercises;Aspiration precaution training   Potential to Achieve Goals Good   Potential Considerations Ability to learn/carryover information;Cooperation/participation level;Severity of impairments  SLP Home Exercise Plan provided today   Consulted and Agree with Plan of Care Patient;Family member/caregiver   Family Member Consulted wife      Patient will benefit from skilled therapeutic intervention in order to improve the following deficits and impairments:   Dysarthria and anarthria    Problem List Patient Active Problem List   Diagnosis Date Noted  . Trochanteric bursitis 03/03/2016  . Left hand pain 10/30/2015  . Fracture, finger, multiple sites 10/30/2015  . Colon cancer screening 07/23/2015  . Screening examination for infectious disease 07/23/2015  . Gout 02/18/2015  . Depression 01/06/2015  . Irritation of eyelid 08/20/2014  . Dupuytren's contracture 08/20/2014  . Cough 07/21/2014  . Lumbar stenosis with neurogenic claudication 06/13/2014  . Preop cardiovascular exam 05/28/2014  . S/P deep brain stimulator placement 05/08/2014  . PD (Parkinson's disease) ([place]) 05/08/2014  . Joint pain 01/22/2014  . Medicare annual wellness visit, initial 01/19/2014  . Advance care planning 01/19/2014  . Parkinson's disease ([place])  12/13/2013  . PTSD (post-traumatic stress disorder) 06/13/2013  . Erectile dysfunction 06/13/2013  . HLD (hyperlipidemia) 06/13/2013  . Hip pain 06/10/2013  . Pain in joint, shoulder region 06/10/2013  . Right leg swelling 06/03/2013  . Essential hypertension 06/03/2013  . Bradycardia by electrocardiogram 06/03/2013  . Obstructive sleep apnea 03/12/2013  . Spinal stenosis, lumbar region, with neurogenic claudication 12/14/2012   Ameia Morency B. Douglas, MSP, CCC-SLP  Shonna Chock 06/14/2016, 12:25 PM  [facility] [address] [place], [place], [address] Phone: [phone]   Fax:  [phone]   Name: [name] MRN: [id number] Date of Birth: 1950/08/31

## 2016-06-14 NOTE — Therapy (Signed)
[facility] [address] [place], [place], [address] Phone: [phone]   Fax:  [phone]  Physical Therapy Treatment  Patient Details  Name: [name] MRN: [id number] Date of Birth: 1950-12-17 Referring Provider: Alonza Bogus  Encounter Date: 06/14/2016      PT End of Session - 06/14/16 1438    Visit Number 8   Number of Visits 13   Date for PT Re-Evaluation 07/05/16   Authorization Type [employer] PPO-GCODE every 10th visit   PT Start Time 1102   PT Stop Time 1144   PT Time Calculation (min) 42 min   Activity Tolerance Patient tolerated treatment well   Behavior During Therapy [facility] for tasks assessed/performed      Past Medical History:  Diagnosis Date  . Arthritis   . Bradycardia   . Cancer [facility]) 2013   skin cancer  . Depression    ptsd  . Dysrhythmia    chronic slow heart rate  . GERD (gastroesophageal reflux disease)   . Headache(784.0)    tension headaches non recent  . History of chicken pox   . History of kidney stones   . Hypertension    treated with HCTZ  . Parkinson's disease ([place])    dx'ed 15 years ago  . PTSD (post-traumatic stress disorder)   . Shortness of breath dyspnea   . Sleep apnea    doesn't use C-pap  . Varicose veins     Past Surgical History:  Procedure Laterality Date  . CHOLECYSTECTOMY N/A 10/22/2014   Procedure: LAPAROSCOPIC CHOLECYSTECTOMY WITH INTRAOPERATIVE CHOLANGIOGRAM;  Surgeon: Dia Crawford III, MD;  Location: [facility] ORS;  Service: General;  Laterality: N/A;  . cyst removed      from lip as a child  . LUMBAR LAMINECTOMY/DECOMPRESSION MICRODISCECTOMY Bilateral 12/14/2012   Procedure: Bilateral lumbar three-four, four-five decompressive laminotomy/foraminotomy;  Surgeon: Charlie Pitter, MD;  Location: [place] NEUR[facility] ORS;  Service: Neurosurgery;  Laterality: Bilateral;  . PULSE GENERATOR IMPLANT Bilateral 12/13/2013   Procedure: Bilateral implantable pulse generator  placement;  Surgeon: Erline Levine, MD;  Location: [place] NEURO ORS;  Service: Neurosurgery;  Laterality: Bilateral;  Bilateral implantable pulse generator placement  . skin cancer removed     from ears,   12 lft arm  rt leg 15  . SUBTHALAMIC STIMULATOR INSERTION Bilateral 12/06/2013   Procedure: SUBTHALAMIC STIMULATOR INSERTION;  Surgeon: Erline Levine, MD;  Location: [place] NEURO ORS;  Service: Neurosurgery;  Laterality: Bilateral;  Bilateral deep brain stimulator placement    There were no vitals filed for this visit.      Subjective Assessment - 06/14/16 1106    Subjective When using the U-step RW, not having falls.  Had a fall in the garage where I couldn't fit my U-step RW; fell forward, but didn't get hurt.  Interested in getting back to gym-[employer].   Patient is accompained by: Family member   Patient Stated Goals Pt's goal for therapy is to help with legs and with balance.   Currently in Pain? Yes   Pain Score 3    Pain Location Shoulder   Pain Orientation Mid   Pain Type Chronic pain   Pain Onset More than a month ago   Pain Frequency Intermittent   Aggravating Factors  malpositioning   Pain Relieving Factors repositioning   Effect of Pain on Daily Activities OT aware  [place] Adult PT Treatment/Exercise - 06/14/16 1111      Transfers   Transfers Sit to Stand;Stand to Sit   Sit to Stand 6: Modified independent (Device/Increase time)   Stand to Sit 6: Modified independent (Device/Increase time)   Comments Multiple reps throughout session, turning to sit from gait/balance activities.  Pt demo improved safety and sequencing with transfers this visit.     Self-Care   Self-Care Other Self-Care Comments   Other Self-Care Comments  Discussed return to gym options-including optimal PD exercise post d/c     Knee/Hip Exercises: Aerobic   Stepper SciFit seated stepper, legs only, x 8 minutes, at Level 2, RPM >75-80 for increased intensity  Pt  rates work level as 3/10     Knee/Hip Exercises: Machines for Strengthening   Cybex Leg Press bilateral lower extremities 125# x 10 reps, then 155 lbs x 20 reps; cues for abdominal activation, breathing as well as slowed pace, control of exercise             Balance Exercises - 06/14/16 1430      Balance Exercises: Standing   Stepping Strategy Anterior;Posterior;Lateral;UE support;10 reps  At counter, 10 reps each leg   Retro Gait Upper extremity support;Other reps (comment)  At counter-forward/back, quick change of directions   Other Standing Exercises At counter, stagger stance position anterior/posterior rocking and weightshifting x10 reps each, with verbal and tactile cues for weigthshifting.  Progressions to perturbations given to evoke step strategy in posterior and lateral directions, with conversation tasks added to increase distraction with task.  No uncontrolled LOB noted during task.      Self Care continued: Provided handout on optimal PD fitness routine-therapy exercises, walking, and aerobic exercises.  Discussed options for pool activities, including water walking activities, with wife's assistance.  Discussed intensity of aerobic exercise as part of ongoing exercises (may need to defer to cargiologist for any specific restrictions/precautions for exercise and activity)      PT Education - 06/14/16 1435    Education provided Yes   Education Details Discussed transition to [employer] upon d/c from PT; optimal PD fitness routine-see instructions   Person(s) Educated Patient;Spouse   Methods Explanation;Demonstration;Handout   Comprehension Verbalized understanding          PT Short Term Goals - 06/07/16 0941      PT SHORT TERM GOAL #1   Title Pt will perform HEP with family's supervision, for improved balance, transfers, and gait.  TARGET 06/08/16   Baseline Pt has been instructed in HEP for lower extremity stretches, and appears to be performing.   Time 4    Period Weeks   Status Achieved     PT SHORT TERM GOAL #2   Title Pt will perform 5x sit<>stand in less than or equal to 15 seconds for improved efficiency and safety with transfers.   Baseline 5x sit<>stand 18.75 sec- 06/07/16   Time 4   Status Not Met     PT SHORT TERM GOAL #3   Title Pt will improve TUG score to less than or equal to 13.5 seconds for decreased fall risk.   Baseline TUG with rollator25.87 sec 06/07/16   Time 4   Period Weeks   Status Not Met     PT SHORT TERM GOAL #4   Title Pt will perform floor>stand transfer with min assist of family member for improved fall recovery.   Baseline min guard assistance iwth UE support 06/01/16 visit   Time 4  Period Weeks   Status Achieved     PT SHORT TERM GOAL #5   Title Pt will verbalize at least 3 means to decrease pain along R lateral thigh/iliotibial band.   Time 4   Period Weeks   Status On-going           PT Long Term Goals - 05/09/16 2124      PT LONG TERM GOAL #1   Title Pt/family will verabalize understanding of fall prevention/tips to reduce freezing with gait in the home environment.  TARGET 07/07/16   Time 6   Period Weeks   Status New     PT LONG TERM GOAL #2   Title Pt will improve gait velocity to at least 2.62 ft/sec for improved community ambulator status (with appropriate assistive device).   Time 6   Period Weeks   Status New     PT LONG TERM GOAL #3   Title Pt will report at least 25% improvement in performance of low surface transfers for improved ability to get in and out of car as well as restaraunt seating.    Time 6   Period Weeks   Status New     PT LONG TERM GOAL #4   Title Pt/wife will verbalize understanding of plans for continued community fitness upon D/C from PT.   Time 6   Period Weeks   Status New     PT LONG TERM GOAL #5   Title Pt will demonstrate proper use and technique of U-step RW for improved household and community gait.   Time 6   Period Weeks   Status New                Plan - 06/14/16 1439    Clinical Impression Statement Focused skilled PT session today on performing exercises for stepping strategies in lateral/posterior direction.  Also addressed pt/wife's questions regarding return to [facility] gym-machines, swimming pool upon d/c from PT.     Rehab Potential Good   PT Frequency 2x / week   PT Duration 6 weeks   PT Treatment/Interventions ADLs/Self Care Home Management;Functional mobility training;Gait training;DME Instruction;Neuromuscular re-education;Patient/family education;Balance training;Therapeutic exercise;Therapeutic activities;Manual techniques   PT Next Visit Plan Check on use of U-step walker (agrees to use at all times thru 2/27), Review step strategies in  posterior, lateral directions that were added to HEP; forward/back walking (this is week 5 of 6 in POC)   Consulted and Agree with Plan of Care Patient;Family member/caregiver   Family Member Consulted spouse      Patient will benefit from skilled therapeutic intervention in order to improve the following deficits and impairments:  Abnormal gait, Decreased balance, Decreased mobility, Decreased knowledge of use of DME, Decreased safety awareness, Decreased strength, Difficulty walking, Postural dysfunction, Pain  Visit Diagnosis: Unsteadiness on feet  Other abnormalities of gait and mobility     Problem List Patient Active Problem List   Diagnosis Date Noted  . Trochanteric bursitis 03/03/2016  . Left hand pain 10/30/2015  . Fracture, finger, multiple sites 10/30/2015  . Colon cancer screening 07/23/2015  . Screening examination for infectious disease 07/23/2015  . Gout 02/18/2015  . Depression 01/06/2015  . Irritation of eyelid 08/20/2014  . Dupuytren's contracture 08/20/2014  . Cough 07/21/2014  . Lumbar stenosis with neurogenic claudication 06/13/2014  . Preop cardiovascular exam 05/28/2014  . S/P deep brain stimulator placement 05/08/2014  . PD  (Parkinson's disease) ([place]) 05/08/2014  . Joint pain 01/22/2014  . Medicare annual wellness  visit, initial 01/19/2014  . Advance care planning 01/19/2014  . Parkinson's disease ([place]) 12/13/2013  . PTSD (post-traumatic stress disorder) 06/13/2013  . Erectile dysfunction 06/13/2013  . HLD (hyperlipidemia) 06/13/2013  . Hip pain 06/10/2013  . Pain in joint, shoulder region 06/10/2013  . Right leg swelling 06/03/2013  . Essential hypertension 06/03/2013  . Bradycardia by electrocardiogram 06/03/2013  . Obstructive sleep apnea 03/12/2013  . Spinal stenosis, lumbar region, with neurogenic claudication 12/14/2012    Charene Mccallister W. 06/14/2016, 2:45 PM  Frazier Butt., PT  [facility] [address] [place], [place], [address] Phone: [phone]   Fax:  [phone]  Name: [name] MRN: [id number] Date of Birth: [birth year]

## 2016-06-14 NOTE — Patient Instructions (Addendum)
Optimal Fitness Program after Therapy for People with Parkinson's Disease  1)  Therapy Home Exercise Program  -Do these Exercises DAILY as instructed by your therapist  -Big, deliberate effort with exercises  -These exercises are important to perform consistently, even when therapist has  finished, because these therapy exercises often address your specific  Parkinson's difficulties   2)  Walking  -  Work up to walking 3-5 times per week, 20-30 minutes per day  -This can be done at home, driveway, quiet street or an indoor track  -Focus should be on your Best posture, arm swing, step length for your best walking pattern  3)  Aerobic Exercise  -Work up to 3-5 times per week, 30 minutes per day  -This can be stationary bike, seated stepper machine, elliptical machine  -Work up to 7-8/10 intensity during the exercise, at minimal to moderate     Resistance      

## 2016-06-16 ENCOUNTER — Ambulatory Visit (INDEPENDENT_AMBULATORY_CARE_PROVIDER_SITE_OTHER): Payer: Medicare PPO | Admitting: Psychology

## 2016-06-16 ENCOUNTER — Ambulatory Visit: Payer: Medicare PPO | Admitting: Occupational Therapy

## 2016-06-16 ENCOUNTER — Ambulatory Visit: Payer: Medicare PPO

## 2016-06-16 ENCOUNTER — Ambulatory Visit: Payer: Medicare PPO | Attending: Neurology | Admitting: Physical Therapy

## 2016-06-16 DIAGNOSIS — R2689 Other abnormalities of gait and mobility: Secondary | ICD-10-CM

## 2016-06-16 DIAGNOSIS — R2681 Unsteadiness on feet: Secondary | ICD-10-CM | POA: Diagnosis not present

## 2016-06-16 DIAGNOSIS — R471 Dysarthria and anarthria: Secondary | ICD-10-CM

## 2016-06-16 DIAGNOSIS — M6281 Muscle weakness (generalized): Secondary | ICD-10-CM

## 2016-06-16 DIAGNOSIS — R41844 Frontal lobe and executive function deficit: Secondary | ICD-10-CM | POA: Insufficient documentation

## 2016-06-16 DIAGNOSIS — R278 Other lack of coordination: Secondary | ICD-10-CM | POA: Insufficient documentation

## 2016-06-16 DIAGNOSIS — R1312 Dysphagia, oropharyngeal phase: Secondary | ICD-10-CM | POA: Insufficient documentation

## 2016-06-16 DIAGNOSIS — R29818 Other symptoms and signs involving the nervous system: Secondary | ICD-10-CM | POA: Diagnosis not present

## 2016-06-16 DIAGNOSIS — F4321 Adjustment disorder with depressed mood: Secondary | ICD-10-CM | POA: Diagnosis not present

## 2016-06-16 DIAGNOSIS — R41841 Cognitive communication deficit: Secondary | ICD-10-CM | POA: Insufficient documentation

## 2016-06-16 DIAGNOSIS — R293 Abnormal posture: Secondary | ICD-10-CM | POA: Insufficient documentation

## 2016-06-16 DIAGNOSIS — R29898 Other symptoms and signs involving the musculoskeletal system: Secondary | ICD-10-CM | POA: Insufficient documentation

## 2016-06-16 NOTE — Therapy (Signed)
[facility] [address] [place], [place], [address] Phone: [phone]   Fax:  [phone]  Speech Language Pathology Treatment  Patient Details  Name: [name] MRN: [id number] Date of Birth: 1951/04/08 Referring Provider: Alonza Bogus, DO  Encounter Date: 06/16/2016      End of Session - 06/16/16 1702    Visit Number 9   Number of Visits 17   Date for SLP Re-Evaluation 07/22/16   SLP Start Time 0806   SLP Stop Time  0846   SLP Time Calculation (min) 40 min   Activity Tolerance Patient tolerated treatment well      Past Medical History:  Diagnosis Date  . Arthritis   . Bradycardia   . Cancer [facility]) 2013   skin cancer  . Depression    ptsd  . Dysrhythmia    chronic slow heart rate  . GERD (gastroesophageal reflux disease)   . Headache(784.0)    tension headaches non recent  . History of chicken pox   . History of kidney stones   . Hypertension    treated with HCTZ  . Parkinson's disease ([place])    dx'ed 15 years ago  . PTSD (post-traumatic stress disorder)   . Shortness of breath dyspnea   . Sleep apnea    doesn't use C-pap  . Varicose veins     Past Surgical History:  Procedure Laterality Date  . CHOLECYSTECTOMY N/A 10/22/2014   Procedure: LAPAROSCOPIC CHOLECYSTECTOMY WITH INTRAOPERATIVE CHOLANGIOGRAM;  Surgeon: Dia Crawford III, MD;  Location: [facility] ORS;  Service: General;  Laterality: N/A;  . cyst removed      from lip as a child  . LUMBAR LAMINECTOMY/DECOMPRESSION MICRODISCECTOMY Bilateral 12/14/2012   Procedure: Bilateral lumbar three-four, four-five decompressive laminotomy/foraminotomy;  Surgeon: Charlie Pitter, MD;  Location: [facility];  Service: Neurosurgery;  Laterality: Bilateral;  . PULSE GENERATOR IMPLANT Bilateral 12/13/2013   Procedure: Bilateral implantable pulse generator placement;  Surgeon: Erline Levine, MD;  Location: [place] [facility];  Service: Neurosurgery;  Laterality: Bilateral;   Bilateral implantable pulse generator placement  . skin cancer removed     from ears,   12 lft arm  rt leg 15  . SUBTHALAMIC STIMULATOR INSERTION Bilateral 12/06/2013   Procedure: SUBTHALAMIC STIMULATOR INSERTION;  Surgeon: Erline Levine, MD;  Location: [place] [facility];  Service: Neurosurgery;  Laterality: Bilateral;  Bilateral deep brain stimulator placement    There were no vitals filed for this visit.             ADULT SLP TREATMENT - 06/16/16 0830      General Information   Behavior/Cognition Alert;Cooperative;Lethargic     Treatment Provided   Treatment provided Dysphagia;Cognitive-Linquistic     Dysphagia Treatment   Temperature Spikes Noted No   Respiratory Status Room air   Oral Cavity - Dentition Adequate natural dentition   Treatment Methods Therapeutic exercise;Patient/caregiver education   Patient observed directly with PO's Yes   Type of PO's observed Thin liquids   Liquids provided via Cup   Oral Phase Signs & Symptoms Oral holding   Pharyngeal Phase Signs & Symptoms Audible swallow   Type of cueing Verbal   Amount of cueing Minimal   Other treatment/comments Pt with struggle with Mendelsohn and breath hold/laryngeal adduction with the HEP today. He req'd mod A for both exercises and did not show emergent awareness during HEP practice.     Cognitive-Linquistic Treatment   Treatment focused on Dysarthria;Patient/family/caregiver education   Skilled Treatment  SLP targeted pt's louder more intelligible voice at the sentence level today and req'd mod A occasionally for reducing speech rate. Pt wife stated pt told her he attend therapy here by himself next week, but has told her they are going the wrong way multiple times when they are proceeding correctly to their destination. Wife agreed with SLP that pt was not safe to drive.     Assessment / Recommendations / Plan   Plan Continue with current plan of care     Progression Toward Goals   Progression toward goals  Progressing toward goals          SLP Education - 06/16/16 1701    Education provided Yes   Education Details Pt not safe to drive himself to therapy   Person(s) Educated Spouse   Methods Explanation   Comprehension Verbalized understanding          SLP Short Term Goals - 06/14/16 1223      SLP SHORT TERM GOAL #1   Title pt will maintain speech volume appropriate for 80% intelligibilty in 3 minutes simple conversation with rare min A   Status Not Met     SLP SHORT TERM GOAL #2   Title pt will demo emergent awareness re: reduced speech loudness in conversation, by repeating hjimself spontaneously 20% of opportunities   Status Not Met     SLP SHORT TERM GOAL #3   Title pt will demo knowledge of memory compensations during 3 therapy sessions   Status Deferred     SLP SHORT TERM GOAL #4   Title pt will perform dysphagia HEP with occasional min cues over two sessions    Status Partially Met          SLP Long Term Goals - 06/16/16 1706      SLP LONG TERM GOAL #1   Title pt will maintain speech volume adequate to achieve at least 90% intelligibility in a noisy environment over 5 minutes of simple conversation over two sessions   Time 4   Period Weeks   Status Revised     SLP LONG TERM GOAL #2   Title pt will demo emergent awareness of reduced intelligibility in 5 minutes conversation by self-repeating when given non-verbal cues   Time 4   Period Weeks   Status Revised     SLP LONG TERM GOAL #3   Title pt will complete dysphagia HEP with rare min cues over three sessions   Time 4   Period Weeks   Status On-going          Plan - 06/16/16 1702    Clinical Impression Statement Today pt demonstrated reduced communicative effectiveness due to decr'd speech loudness and rushes of speech with reduced awareness in conversation. SLP A to reduce rate was not largely successful. See "education" section for education completed. Skilled ST remains needed to improve pt's  speech clarity as well as strengthen his swallowing  musculature.   Speech Therapy Frequency 2x / week   Duration --  8 weeks   Treatment/Interventions Compensatory techniques;Cognitive reorganization;SLP instruction and feedback;Internal/external aids;Functional tasks;Patient/family education;Cueing hierarchy   Potential to Achieve Goals Good   Potential Considerations Ability to learn/carryover information;Cooperation/participation level;Severity of impairments   SLP Home Exercise Plan provided today   Consulted and Agree with Plan of Care Patient      Patient will benefit from skilled therapeutic intervention in order to improve the following deficits and impairments:   Dysarthria and anarthria  Dysphagia, oropharyngeal  Cognitive communication deficit    Problem List Patient Active Problem List   Diagnosis Date Noted  . Trochanteric bursitis 03/03/2016  . Left hand pain 10/30/2015  . Fracture, finger, multiple sites 10/30/2015  . Colon cancer screening 07/23/2015  . Screening examination for infectious disease 07/23/2015  . Gout 02/18/2015  . Depression 01/06/2015  . Irritation of eyelid 08/20/2014  . Dupuytren's contracture 08/20/2014  . Cough 07/21/2014  . Lumbar stenosis with neurogenic claudication 06/13/2014  . Preop cardiovascular exam 05/28/2014  . S/P deep brain stimulator placement 05/08/2014  . PD (Parkinson's disease) ([place]) 05/08/2014  . Joint pain 01/22/2014  . Medicare annual wellness visit, initial 01/19/2014  . Advance care planning 01/19/2014  . Parkinson's disease ([place]) 12/13/2013  . PTSD (post-traumatic stress disorder) 06/13/2013  . Erectile dysfunction 06/13/2013  . HLD (hyperlipidemia) 06/13/2013  . Hip pain 06/10/2013  . Pain in joint, shoulder region 06/10/2013  . Right leg swelling 06/03/2013  . Essential hypertension 06/03/2013  . Bradycardia by electrocardiogram 06/03/2013  . Obstructive sleep apnea 03/12/2013  . Spinal stenosis, lumbar  region, with neurogenic claudication 12/14/2012    [facility] ,[place], CCC-SLP  06/16/2016, 5:08 PM  [facility] [address] [place], [place], [address] Phone: [phone]   Fax:  [phone]   Name: [name] MRN: [id number] Date of Birth: [birth year]

## 2016-06-16 NOTE — Therapy (Signed)
[facility] [address] [place], [place], [address] Phone: [phone]   Fax:  [phone]  Physical Therapy Treatment  Patient Details  Name: [name] MRN: [id number] Date of Birth: 08-08-1950 Referring Provider: Alonza Bogus  Encounter Date: 06/16/2016      PT End of Session - 06/16/16 1916    Visit Number 9   Number of Visits 13   Date for PT Re-Evaluation 07/05/16   Authorization Type [employer] Medicare PPO-GCODE every 10th visit   PT Start Time 0934   PT Stop Time 1015   PT Time Calculation (min) 41 min   Activity Tolerance Patient tolerated treatment well   Behavior During Therapy [facility] for tasks assessed/performed      Past Medical History:  Diagnosis Date  . Arthritis   . Bradycardia   . Cancer [facility]) 2013   skin cancer  . Depression    ptsd  . Dysrhythmia    chronic slow heart rate  . GERD (gastroesophageal reflux disease)   . Headache(784.0)    tension headaches non recent  . History of chicken pox   . History of kidney stones   . Hypertension    treated with HCTZ  . Parkinson's disease ([place])    dx'ed 15 years ago  . PTSD (post-traumatic stress disorder)   . Shortness of breath dyspnea   . Sleep apnea    doesn't use C-pap  . Varicose veins     Past Surgical History:  Procedure Laterality Date  . CHOLECYSTECTOMY N/A 10/22/2014   Procedure: LAPAROSCOPIC CHOLECYSTECTOMY WITH INTRAOPERATIVE CHOLANGIOGRAM;  Surgeon: Dia Crawford III, MD;  Location: [facility] ORS;  Service: General;  Laterality: N/A;  . cyst removed      from lip as a child  . LUMBAR LAMINECTOMY/DECOMPRESSION MICRODISCECTOMY Bilateral 12/14/2012   Procedure: Bilateral lumbar three-four, four-five decompressive laminotomy/foraminotomy;  Surgeon: Charlie Pitter, MD;  Location: [facility];  Service: Neurosurgery;  Laterality: Bilateral;  . PULSE GENERATOR IMPLANT Bilateral 12/13/2013   Procedure: Bilateral implantable pulse generator  placement;  Surgeon: Erline Levine, MD;  Location: [facility];  Service: Neurosurgery;  Laterality: Bilateral;  Bilateral implantable pulse generator placement  . skin cancer removed     from ears,   12 lft arm  rt leg 15  . SUBTHALAMIC STIMULATOR INSERTION Bilateral 12/06/2013   Procedure: SUBTHALAMIC STIMULATOR INSERTION;  Surgeon: Erline Levine, MD;  Location: [facility];  Service: Neurosurgery;  Laterality: Bilateral;  Bilateral deep brain stimulator placement    There were no vitals filed for this visit.      Subjective Assessment - 06/16/16 1923    Subjective Had a fall yesterday when not using the walker-couldn't fit it between the door and motorcycle, and fell down 3 steps.  Have slightly more shoulder pain than usual.   Patient is accompained by: Family member   Patient Stated Goals Pt's goal for therapy is to help with legs and with balance.   Currently in Pain? Yes   Pain Score 5    Pain Location Shoulder   Pain Orientation Right;Left   Pain Descriptors / Indicators Sore   Pain Type Chronic pain  worse since fall   Pain Onset More than a month ago  more acute due to fall   Pain Frequency Intermittent   Aggravating Factors  fall, movement   Pain Relieving Factors unsure  [facility] Adult PT Treatment/Exercise - 06/16/16 0001      Exercises   Other Exercises  Quadruped alternating UE lifts x 5 reps, then alternating lower extremities with min guard assistance x 5 reps each leg (cues for abdominal activation); quadruped rocking forward and back position x 5 reps, then quadruped>tall kneel with upright posture, 2 sets x 5 reps, for postural strengthening  Pt requires min guard/supervision, cues postural stability     Knee/Hip Exercises: Aerobic   Stepper SciFit seated stepper, legs only, x 8 minutes, at Level 2.6, RPM >75-80 for increased intensity     Knee/Hip Exercises: Machines for Strengthening   Cybex Leg Press bilateral lower  extremities 160# x 20 reps, then 150 # x 10 reps  Cues for slowed pacing, control of lower extremities             Balance Exercises - 06/16/16 1006      Balance Exercises: Standing   Stepping Strategy Anterior;Posterior;Lateral;UE support;10 reps  2nd set of 10 with alternating legs   Retro Gait Upper extremity support;Other reps (comment);5 reps  then forward/back quick change of direction; start/stops   Sidestepping Upper extremity support;5 reps  Cues for slowed pacing, large steps   Other Standing Exercises During standing counter exercises above, PT provides supervision and cues for appropriate step length and foot clearance, for work on improved stepping strategies.             PT Short Term Goals - 06/07/16 0941      PT SHORT TERM GOAL #1   Title Pt will perform HEP with family's supervision, for improved balance, transfers, and gait.  TARGET 06/08/16   Baseline Pt has been instructed in HEP for lower extremity stretches, and appears to be performing.   Time 4   Period Weeks   Status Achieved     PT SHORT TERM GOAL #2   Title Pt will perform 5x sit<>stand in less than or equal to 15 seconds for improved efficiency and safety with transfers.   Baseline 5x sit<>stand 18.75 sec- 06/07/16   Time 4   Status Not Met     PT SHORT TERM GOAL #3   Title Pt will improve TUG score to less than or equal to 13.5 seconds for decreased fall risk.   Baseline TUG with rollator25.87 sec 06/07/16   Time 4   Period Weeks   Status Not Met     PT SHORT TERM GOAL #4   Title Pt will perform floor>stand transfer with min assist of family member for improved fall recovery.   Baseline min guard assistance iwth UE support 06/01/16 visit   Time 4   Period Weeks   Status Achieved     PT SHORT TERM GOAL #5   Title Pt will verbalize at least 3 means to decrease pain along R lateral thigh/iliotibial band.   Time 4   Period Weeks   Status On-going           PT Long Term Goals -  05/09/16 2124      PT LONG TERM GOAL #1   Title Pt/family will verabalize understanding of fall prevention/tips to reduce freezing with gait in the home environment.  TARGET 07/07/16   Time 6   Period Weeks   Status New     PT LONG TERM GOAL #2   Title Pt will improve gait velocity to at least 2.62 ft/sec for improved community ambulator status (with appropriate assistive device).   Time 6  Period Weeks   Status New     PT LONG TERM GOAL #3   Title Pt will report at least 25% improvement in performance of low surface transfers for improved ability to get in and out of car as well as restaraunt seating.    Time 6   Period Weeks   Status New     PT LONG TERM GOAL #4   Title Pt/wife will verbalize understanding of plans for continued community fitness upon D/C from PT.   Time 6   Period Weeks   Status New     PT LONG TERM GOAL #5   Title Pt will demonstrate proper use and technique of U-step RW for improved household and community gait.   Time 6   Period Weeks   Status New               Plan - 06/16/16 1918    Clinical Impression Statement Pt reports not yet looking into [employer] and not yet performing standing balance exercises as part of HEP provided last visit.  Focused PT session on postural strengthening exercises and balance.  Did not add postural strengthening in quadruped or tall kneel, as pt appears to have increased instability in these positions today without UE support.  Pt will continue to benefit from skilled PT to address balance, posture, gait safety, with likely discharge next week.   Rehab Potential Good   PT Frequency 2x / week   PT Duration 6 weeks   PT Treatment/Interventions ADLs/Self Care Home Management;Functional mobility training;Gait training;DME Instruction;Neuromuscular re-education;Patient/family education;Balance training;Therapeutic exercise;Therapeutic activities;Manual techniques   PT Next Visit Plan Check on use of U-step walker (agrees  to use at all times thru 2/27), GCODE next visit; begin checking LTGs and plan for discharge next week(this is week 5 of 6 in POC)   Consulted and Agree with Plan of Care Patient;Family member/caregiver   Family Member Consulted spouse      Patient will benefit from skilled therapeutic intervention in order to improve the following deficits and impairments:  Abnormal gait, Decreased balance, Decreased mobility, Decreased knowledge of use of DME, Decreased safety awareness, Decreased strength, Difficulty walking, Postural dysfunction, Pain  Visit Diagnosis: Unsteadiness on feet  Muscle weakness (generalized)  Other abnormalities of gait and mobility     Problem List Patient Active Problem List   Diagnosis Date Noted  . Trochanteric bursitis 03/03/2016  . Left hand pain 10/30/2015  . Fracture, finger, multiple sites 10/30/2015  . Colon cancer screening 07/23/2015  . Screening examination for infectious disease 07/23/2015  . Gout 02/18/2015  . Depression 01/06/2015  . Irritation of eyelid 08/20/2014  . Dupuytren's contracture 08/20/2014  . Cough 07/21/2014  . Lumbar stenosis with neurogenic claudication 06/13/2014  . Preop cardiovascular exam 05/28/2014  . S/P deep brain stimulator placement 05/08/2014  . PD (Parkinson's disease) ([place]) 05/08/2014  . Joint pain 01/22/2014  . Medicare annual wellness visit, initial 01/19/2014  . Advance care planning 01/19/2014  . Parkinson's disease ([place]) 12/13/2013  . PTSD (post-traumatic stress disorder) 06/13/2013  . Erectile dysfunction 06/13/2013  . HLD (hyperlipidemia) 06/13/2013  . Hip pain 06/10/2013  . Pain in joint, shoulder region 06/10/2013  . Right leg swelling 06/03/2013  . Essential hypertension 06/03/2013  . Bradycardia by electrocardiogram 06/03/2013  . Obstructive sleep apnea 03/12/2013  . Spinal stenosis, lumbar region, with neurogenic claudication 12/14/2012    Ngoc Daughtridge W. 06/16/2016, 7:25 PM [place]  PT [facility] [address]  [place], [place], [address] Phone: [phone]   Fax:  [phone]  Name: [name] MRN: [id number] Date of Birth: Nov 13, 1950

## 2016-06-16 NOTE — Patient Instructions (Signed)
PWR! Hands  Start with elbows bent and hands closed.  PWR! Hands: Push hands out BIG. Elbows straight, wrists up, fingers open and spread apart BIG. (Can also perform by pushing down on table, chair, knees. Push above head, out to the side, behind you, in front of you.)  With arms stretched out in front of you (elbows straight), perform the following:  PWR! Rock: Move wrists up and down [employer]! Twist: Twist palms up and down BIG  ** Make each movement big and deliberate so that you feel the movement.  Perform at least 10 repetitions 1x/day, but perform PWR! hands throughout the day when you are having trouble using your hands (picking up/manipulating small objects, writing, eating, typing, sewing, buttoning, etc.).

## 2016-06-16 NOTE — Therapy (Signed)
[facility] [address] [place], [place], [address] Phone: [phone]   Fax:  [phone]  Occupational Therapy Treatment  Patient Details  Name: [name] MRN: [id number] Date of Birth: 1950/06/27 Referring Provider: Dr. Wells Guiles Tat  Encounter Date: 06/16/2016      OT End of Session - 06/16/16 0904    Visit Number 10   Number of Visits 17   Date for OT Re-Evaluation 07/07/16   Authorization Type [employer] Medicare / [employer], no auth/visit limit, G-code needed   Authorization - Visit Number 10   Authorization - Number of Visits 20   OT Start Time 0848   OT Stop Time 0930   OT Time Calculation (min) 42 min   Activity Tolerance Patient tolerated treatment well   Behavior During Therapy [facility] for tasks assessed/performed      Past Medical History:  Diagnosis Date  . Arthritis   . Bradycardia   . Cancer [facility]) 2013   skin cancer  . Depression    ptsd  . Dysrhythmia    chronic slow heart rate  . GERD (gastroesophageal reflux disease)   . Headache(784.0)    tension headaches non recent  . History of chicken pox   . History of kidney stones   . Hypertension    treated with HCTZ  . Parkinson's disease ([place])    dx'ed 15 years ago  . PTSD (post-traumatic stress disorder)   . Shortness of breath dyspnea   . Sleep apnea    doesn't use C-pap  . Varicose veins     Past Surgical History:  Procedure Laterality Date  . CHOLECYSTECTOMY N/A 10/22/2014   Procedure: LAPAROSCOPIC CHOLECYSTECTOMY WITH INTRAOPERATIVE CHOLANGIOGRAM;  Surgeon: Dia Crawford III, MD;  Location: [facility] ORS;  Service: General;  Laterality: N/A;  . cyst removed      from lip as a child  . LUMBAR LAMINECTOMY/DECOMPRESSION MICRODISCECTOMY Bilateral 12/14/2012   Procedure: Bilateral lumbar three-four, four-five decompressive laminotomy/foraminotomy;  Surgeon: Charlie Pitter, MD;  Location: [place] NEURO ORS;  Service: Neurosurgery;  Laterality: Bilateral;  .  PULSE GENERATOR IMPLANT Bilateral 12/13/2013   Procedure: Bilateral implantable pulse generator placement;  Surgeon: Erline Levine, MD;  Location: [place] NEURO ORS;  Service: Neurosurgery;  Laterality: Bilateral;  Bilateral implantable pulse generator placement  . skin cancer removed     from ears,   12 lft arm  rt leg 15  . SUBTHALAMIC STIMULATOR INSERTION Bilateral 12/06/2013   Procedure: SUBTHALAMIC STIMULATOR INSERTION;  Surgeon: Erline Levine, MD;  Location: [place] NEURO ORS;  Service: Neurosurgery;  Laterality: Bilateral;  Bilateral deep brain stimulator placement    There were no vitals filed for this visit.      Subjective Assessment - 06/16/16 0849    Subjective  Pt had fall yesterday where he couldn't use his walker.  "Can you fix my handwriting?"  Wife reports that strategies for writing help.   Pertinent History Parkinson's diagnosis approx 17 yrs ago, hx of 3 back surgeries (last approx 2 yrs ago), bilateral DBS placement approx 3 yrs ago, multiple falls, R 3rd digit fx 10/2015, HTN, bradycardia, PTSD, lumbar stenosis, HDL, Dupuytren contracture, depression, gout    Limitations Bilateral Deep Brain Stimulator; Fall risk   Patient Stated Goals safety per wife   Currently in Pain? Yes   Pain Score 5    Pain Location Arm   Pain Orientation Right   Pain Descriptors / Indicators Sore;Aching;Tightness   Pain Onset More than a month ago  Pain Frequency Intermittent   Aggravating Factors  due to fall, movement   Pain Relieving Factors unknown      Practiced writing utilizing strategies (built up grip--issued one for home, focus on forming each letter, fill up lines, print, and stop and perform PWR! Hands/stretch).  Pt able to write 3 sentences, shopping list legibly.  Also practiced signature and was able to write legibly with min decr in size.   Then, continuous "l" with focus on size and spacing, pt demo improvement with repetition.  Wife reports that pt has difficulty texting/dialing  phone.  Recommended use of stylus.  Pt has stylus so practiced with min difficulty/incr time.  Also recommended use of PWR! Hands with difficulty and possibly adjusting settings on phone to avoid double letters.  Pt/wife verbalized understanding.                          OT Education - 06/16/16 0902    Education Details PD Handwriting Tips; PWR! hands (up, rock, twist)   Person(s) Educated Patient   Methods Explanation;Demonstration;Handout;Verbal cues   Comprehension Verbalized understanding;Returned demonstration;Verbal cues required          OT Short Term Goals - 06/16/16 2011      OT SHORT TERM GOAL #1   Title Pt/caregiver will be independent with PD-specific HEP.--check STGs 06/09/16   Time 4   Period Weeks   Status On-going  needs review     OT SHORT TERM GOAL #2   Title Pt will improve coordination for ADLs as shown by improving time on 9-hole peg test by at least 5sec with dominant RUE.   Baseline 60.56sec   Time 4   Period Weeks   Status Achieved  06/07/16:  46.97sec after PWR! hands (no change without PWR! hands)     OT SHORT TERM GOAL #3   Title Pt will improve coordination/functional reaching for ADLs as shown by improving box and blocks score by at least 4 bilaterally.   Baseline R-29, L-33 blocks   Time 4   Period Weeks   Status On-going  06/07/16:  R-26 blocks, L-33blocks      OT SHORT TERM GOAL #4   Title Pt will incr ease with eating as shown by completing PPT#2 in 15sec or less.   Baseline 17.53sec   Time 4   Period Weeks   Status Partially Met  16.09 secs, 14.50 secs(met second trial)     OT SHORT TERM GOAL #5   Title Pt/caregiver will verbalize ways to prevent future complications and appropriate community resources prn.   Time 4   Period Weeks   Status Achieved           OT Long Term Goals - 06/16/16 0910      OT LONG TERM GOAL #1   Title Pt/caregiver will verbalize understanding of adaptive strategies/equipment to incr  ease/safety/independence with ADLs/IADLs.--check LTGs 07/07/16   Baseline -----   Time 8   Period Weeks   Status On-going     OT LONG TERM GOAL #2   Title Pt will improve coordination for ADLs as shown by improving time on 9-hole peg test by at least 10sec with dominant RUE.   Baseline 60.56sec   Time 8   Period Weeks   Status On-going     OT LONG TERM GOAL #3   Title Pt will improve coordination/functional reaching for ADLs as shown by improving box and blocks score by at least 8  with RUE.   Baseline R-29 blocks   Time 8   Period Weeks   Status On-going     OT LONG TERM GOAL #4   Title Pt will fasten/unfasten 3 buttons in less than 45sec for incr ease with dressing.   Baseline 53.0sec   Time 8   Period Weeks   Status On-going     OT LONG TERM GOAL #5   Title Pt will improve balance for ADLs/IADLs as shown by improving standing functional reach test by at least 2 inches with LUE.   Baseline R-8", L-6"   Time 8   Period Weeks   Status On-going     OT LONG TERM GOAL #6   Title ---------   Baseline -------     OT LONG TERM GOAL #7   Title -----   Baseline ------               Plan - 07-11-16 1914    Clinical Impression Statement Pt is progressing towards goals with improved safety and writing using strategies, but would benefit form continued occupational therapy to reinforce strategies so that pt requires less cueing from wife.   Rehab Potential Fair   Clinical Impairments Affecting Rehab Potential cognitive deficits, severity of deficits, hip pain   OT Frequency 2x / week   OT Duration 8 weeks   OT Treatment/Interventions Self-care/ADL training;Moist Heat;Cryotherapy;Therapeutic exercise;Parrafin;DME and/or AE instruction;Therapist, nutritional;Therapeutic activities;Patient/family education;Balance training;Cognitive remediation/compensation;Splinting;Manual Therapy;Neuromuscular education;Ultrasound;Fluidtherapy;Energy conservation;Passive range of  motion;Therapeutic exercises   Plan continue with safety and ADLs; review PWR! in supine and PWR! hands (up, rock, twist) and check HEP goal   OT Home Exercise Plan PWR! supine, basic coordination(cards only), PWR! up seated   Consulted and Agree with Plan of Care Patient;Family member/caregiver   Family Member Consulted wife      Patient will benefit from skilled therapeutic intervention in order to improve the following deficits and impairments:  Decreased mobility, Decreased balance, Abnormal gait, Pain, Impaired tone, Impaired perceived functional ability, Decreased knowledge of precautions, Decreased coordination, Decreased activity tolerance, Decreased cognition, Decreased knowledge of use of DME, Decreased safety awareness, Decreased strength, Impaired UE functional use, Improper spinal/pelvic alignment, Improper body mechanics  Visit Diagnosis: Other lack of coordination  Other symptoms and signs involving the nervous system  Other abnormalities of gait and mobility  Frontal lobe and executive function deficit  Unsteadiness on feet      G-Codes - 07-11-16 0910    Functional Assessment Tool Used (Outpatient only)  9-hole peg test R-46.97 sec, L-39.40sec; Box and blocks test:  R-26, L-33 blocks   Functional Limitation Carrying, moving and handling objects   Carrying, Moving and Handling Objects Current Status [id number]) At least 40 percent but less than 60 percent impaired, limited or restricted   Carrying, Moving and Handling Objects Goal Status (A2130) At least 20 percent but less than 40 percent impaired, limited or restricted      Problem List Patient Active Problem List   Diagnosis Date Noted  . Trochanteric bursitis 03/03/2016  . Left hand pain 10/30/2015  . Fracture, finger, multiple sites 10/30/2015  . Colon cancer screening 07/23/2015  . Screening examination for infectious disease 07/23/2015  . Gout 02/18/2015  . Depression 01/06/2015  . Irritation of eyelid  08/20/2014  . Dupuytren's contracture 08/20/2014  . Cough 07/21/2014  . Lumbar stenosis with neurogenic claudication 06/13/2014  . Preop cardiovascular exam 05/28/2014  . S/P deep brain stimulator placement 05/08/2014  . PD (Parkinson's disease) ([place]) 05/08/2014  .  Joint pain 01/22/2014  . Medicare annual wellness visit, initial 01/19/2014  . Advance care planning 01/19/2014  . Parkinson's disease ([place]) 12/13/2013  . PTSD (post-traumatic stress disorder) 06/13/2013  . Erectile dysfunction 06/13/2013  . HLD (hyperlipidemia) 06/13/2013  . Hip pain 06/10/2013  . Pain in joint, shoulder region 06/10/2013  . Right leg swelling 06/03/2013  . Essential hypertension 06/03/2013  . Bradycardia by electrocardiogram 06/03/2013  . Obstructive sleep apnea 03/12/2013  . Spinal stenosis, lumbar region, with neurogenic claudication 12/14/2012    Occupational Therapy Progress Note  Dates of Reporting Period: 05/09/16 to 06/16/16  Objective Reports of Subjective Statement: see above  Objective Measurements: see above  Goal Update: see above  Plan: see above  Reason Skilled Services are Required: see above   [facility] 06/16/2016, 8:24 PM  [facility] [address] [place], [place], [address] Phone: [phone]   Fax:  [phone]  Name: ELVIN MCCARTIN MRN: [id number] Date of Birth: 01/06/1951   Vianne Bulls, OTR/L [name] [address] [place], [place]  [address] [phone] phone [phone] 06/16/16 8:24 PM

## 2016-06-20 DIAGNOSIS — Z9689 Presence of other specified functional implants: Secondary | ICD-10-CM

## 2016-06-20 DIAGNOSIS — I10 Essential (primary) hypertension: Secondary | ICD-10-CM

## 2016-06-20 DIAGNOSIS — M48062 Spinal stenosis, lumbar region with neurogenic claudication: Secondary | ICD-10-CM | POA: Diagnosis not present

## 2016-06-20 DIAGNOSIS — L820 Inflamed seborrheic keratosis: Secondary | ICD-10-CM | POA: Diagnosis not present

## 2016-06-20 DIAGNOSIS — E782 Mixed hyperlipidemia: Secondary | ICD-10-CM | POA: Diagnosis not present

## 2016-06-20 DIAGNOSIS — Z86018 Personal history of other benign neoplasm: Secondary | ICD-10-CM | POA: Diagnosis not present

## 2016-06-21 ENCOUNTER — Ambulatory Visit: Payer: Medicare PPO

## 2016-06-21 ENCOUNTER — Ambulatory Visit: Payer: Medicare PPO | Admitting: Physical Therapy

## 2016-06-21 ENCOUNTER — Ambulatory Visit: Payer: Medicare PPO | Admitting: Occupational Therapy

## 2016-06-23 ENCOUNTER — Ambulatory Visit: Payer: Medicare PPO | Admitting: Physical Therapy

## 2016-06-23 ENCOUNTER — Ambulatory Visit: Payer: Medicare PPO | Admitting: Occupational Therapy

## 2016-06-23 ENCOUNTER — Ambulatory Visit: Payer: Medicare PPO

## 2016-06-23 DIAGNOSIS — R471 Dysarthria and anarthria: Secondary | ICD-10-CM

## 2016-06-23 DIAGNOSIS — R1312 Dysphagia, oropharyngeal phase: Secondary | ICD-10-CM

## 2016-06-23 DIAGNOSIS — R278 Other lack of coordination: Secondary | ICD-10-CM | POA: Diagnosis not present

## 2016-06-23 DIAGNOSIS — R29818 Other symptoms and signs involving the nervous system: Secondary | ICD-10-CM | POA: Diagnosis not present

## 2016-06-23 DIAGNOSIS — R41844 Frontal lobe and executive function deficit: Secondary | ICD-10-CM | POA: Diagnosis not present

## 2016-06-23 DIAGNOSIS — R2689 Other abnormalities of gait and mobility: Secondary | ICD-10-CM

## 2016-06-23 DIAGNOSIS — R2681 Unsteadiness on feet: Secondary | ICD-10-CM

## 2016-06-23 DIAGNOSIS — R29898 Other symptoms and signs involving the musculoskeletal system: Secondary | ICD-10-CM | POA: Diagnosis not present

## 2016-06-23 DIAGNOSIS — R293 Abnormal posture: Secondary | ICD-10-CM | POA: Diagnosis not present

## 2016-06-23 DIAGNOSIS — R41841 Cognitive communication deficit: Secondary | ICD-10-CM

## 2016-06-23 DIAGNOSIS — M6281 Muscle weakness (generalized): Secondary | ICD-10-CM | POA: Diagnosis not present

## 2016-06-23 NOTE — Therapy (Signed)
[facility] [address] [place], [place], [address] Phone: [phone]   Fax:  [phone]  Speech Language Pathology Treatment  Patient Details  Name: [name] MRN: [id number] Date of Birth: April 10, 1951 Referring Provider: Alonza Bogus, DO  Encounter Date: 06/23/2016      End of Session - 06/23/16 0931    Visit Number 10   Number of Visits 17   Date for SLP Re-Evaluation 07/22/16   SLP Start Time 0848   SLP Stop Time  0930   SLP Time Calculation (min) 42 min   Activity Tolerance Patient tolerated treatment well      Past Medical History:  Diagnosis Date  . Arthritis   . Bradycardia   . Cancer [facility]) 2013   skin cancer  . Depression    ptsd  . Dysrhythmia    chronic slow heart rate  . GERD (gastroesophageal reflux disease)   . Headache(784.0)    tension headaches non recent  . History of chicken pox   . History of kidney stones   . Hypertension    treated with HCTZ  . Parkinson's disease ([place])    dx'ed 15 years ago  . PTSD (post-traumatic stress disorder)   . Shortness of breath dyspnea   . Sleep apnea    doesn't use C-pap  . Varicose veins     Past Surgical History:  Procedure Laterality Date  . CHOLECYSTECTOMY N/A 10/22/2014   Procedure: LAPAROSCOPIC CHOLECYSTECTOMY WITH INTRAOPERATIVE CHOLANGIOGRAM;  Surgeon: Dia Crawford III, MD;  Location: [facility] ORS;  Service: General;  Laterality: N/A;  . cyst removed      from lip as a child  . LUMBAR LAMINECTOMY/DECOMPRESSION MICRODISCECTOMY Bilateral 12/14/2012   Procedure: Bilateral lumbar three-four, four-five decompressive laminotomy/foraminotomy;  Surgeon: Charlie Pitter, MD;  Location: [place] NEURO ORS;  Service: Neurosurgery;  Laterality: Bilateral;  . PULSE GENERATOR IMPLANT Bilateral 12/13/2013   Procedure: Bilateral implantable pulse generator placement;  Surgeon: Erline Levine, MD;  Location: [place] NEURO ORS;  Service: Neurosurgery;  Laterality: Bilateral;   Bilateral implantable pulse generator placement  . skin cancer removed     from ears,   12 lft arm  rt leg 15  . SUBTHALAMIC STIMULATOR INSERTION Bilateral 12/06/2013   Procedure: SUBTHALAMIC STIMULATOR INSERTION;  Surgeon: Erline Levine, MD;  Location: [place] NEURO ORS;  Service: Neurosurgery;  Laterality: Bilateral;  Bilateral deep brain stimulator placement    There were no vitals filed for this visit.      Subjective Assessment - 06/23/16 0856    Subjective "I kind of forgot about (the plate glass window speech) to be honest with you." Upon entering [place] room, SLP understood 0% of pt's utterances. Upon cueing pt incr'd volume level which incr'd intelligibility to functional.    Patient is accompained by: --  alone               ADULT SLP TREATMENT - 06/23/16 0859      General Information   Behavior/Cognition Alert;Cooperative     Treatment Provided   Treatment provided Cognitive-Linquistic     Pain Assessment   Pain Assessment 0-10   Pain Score 6    Pain Location shoulders   Pain Descriptors / Indicators Sore   Pain Intervention(s) Monitored during session     Cognitive-Linquistic Treatment   Treatment focused on Dysarthria;Patient/family/caregiver education   Skilled Treatment "I replaced an alternator for a friend." SLP targeted pt's intelligibility at the sentence level - pt req'd mod A usually  for maintaining speech rate for intelligibility >70%. Pt's conversational speech req'd SLP mod-max cues consistently (verbal, visual, demo cues) for use of rate reduction.     Assessment / Recommendations / Plan   Plan Continue with current plan of care;Other (Comment)  d/c next week     Progression Toward Goals   Progression toward goals Not progressing toward goals (comment)  sentence level essentially unchanged from previous weeks            SLP Short Term Goals - 06/14/16 1223      SLP SHORT TERM GOAL #1   Title pt will maintain speech volume appropriate for 80%  intelligibilty in 3 minutes simple conversation with rare min A   Status Not Met     SLP SHORT TERM GOAL #2   Title pt will demo emergent awareness re: reduced speech loudness in conversation, by repeating hjimself spontaneously 20% of opportunities   Status Not Met     SLP SHORT TERM GOAL #3   Title pt will demo knowledge of memory compensations during 3 therapy sessions   Status Deferred     SLP SHORT TERM GOAL #4   Title pt will perform dysphagia HEP with occasional min cues over two sessions    Status Partially Met          SLP Long Term Goals - 07/07/16 1056      SLP LONG TERM GOAL #1   Title pt will maintain speech volume adequate to achieve at least 90% intelligibility in a noisy environment over 5 minutes of simple conversation over two sessions   Time 3   Period Weeks   Status Revised     SLP LONG TERM GOAL #2   Title pt will demo emergent awareness of reduced intelligibility in 5 minutes conversation by self-repeating when given non-verbal cues   Time 3   Period Weeks   Status Revised     SLP LONG TERM GOAL #3   Title pt will complete dysphagia HEP with rare min cues over three sessions   Time 3   Period Weeks   Status On-going          Plan - 2016/07/07 0931    Clinical Impression Statement Today pt demonstrated continued reduced communicative effectiveness due to decr'd speech loudness and rushes of speech, again with limited awareness, in sentences and in conversation. SLP A to reduce rate was not largely successful. See "education" section for education completed. Skilled ST remains needed to improve pt's speech clarity as well as strengthen his swallowing  musculature.   Speech Therapy Frequency 2x / week   Duration --  8 weeks   Treatment/Interventions Compensatory techniques;Cognitive reorganization;SLP instruction and feedback;Internal/external aids;Functional tasks;Patient/family education;Cueing hierarchy   Potential to Achieve Goals Good    Potential Considerations Ability to learn/carryover information;Cooperation/participation level;Severity of impairments   SLP Home Exercise Plan provided today   Consulted and Agree with Plan of Care Patient      Patient will benefit from skilled therapeutic intervention in order to improve the following deficits and impairments:   Dysarthria and anarthria  Dysphagia, oropharyngeal  Cognitive communication deficit      G-Codes - 2016-07-07 1000    Functional Assessment Tool Used NOMS   Functional Limitations Motor speech   Motor Speech Current Status [phone]) At least 40 percent but less than 60 percent impaired, limited or restricted   Motor Speech Goal Status (E3329) At least 40 percent but less than 60 percent impaired, limited or restricted  Speech Therapy Progress Note  Dates of Reporting Period: 05-12-16 to present  Subjective: Pt has been seen for 10 ST sessions focusing mainly on speech intelligibility and swallowing ability.  Objective Measurements: Pt continues to require SLP cues due to reduced speech intelligibility due to fast rate of speech secondary to Parkinson's and complicated by DBS placement. Pt is impulsive and with reduced safety awareness and thus requires cues to use safe swallowing strategies.  Goal Update: See update, above. Limited number of goals met to date  Plan: See "clinical impression statement" above - pt will likely be seen for one more week (2 more sessions) to maximize pt compensation use to incr intelligibility and swallowing safety.  Reason Skilled Services are Required: Ensure pt/wife know best possible cueing methods to improve pt intelligibility and to minimize/mitigate pt's risk of aspiration.   Problem List Patient Active Problem List   Diagnosis Date Noted  . Trochanteric bursitis 03/03/2016  . Left hand pain 10/30/2015  . Fracture, finger, multiple sites 10/30/2015  . Colon cancer screening 07/23/2015  . Screening examination  for infectious disease 07/23/2015  . Gout 02/18/2015  . Depression 01/06/2015  . Irritation of eyelid 08/20/2014  . Dupuytren's contracture 08/20/2014  . Cough 07/21/2014  . Lumbar stenosis with neurogenic claudication 06/13/2014  . Preop cardiovascular exam 05/28/2014  . S/P deep brain stimulator placement 05/08/2014  . PD (Parkinson's disease) ([place]) 05/08/2014  . Joint pain 01/22/2014  . Medicare annual wellness visit, initial 01/19/2014  . Advance care planning 01/19/2014  . Parkinson's disease ([place]) 12/13/2013  . PTSD (post-traumatic stress disorder) 06/13/2013  . Erectile dysfunction 06/13/2013  . HLD (hyperlipidemia) 06/13/2013  . Hip pain 06/10/2013  . Pain in joint, shoulder region 06/10/2013  . Right leg swelling 06/03/2013  . Essential hypertension 06/03/2013  . Bradycardia by electrocardiogram 06/03/2013  . Obstructive sleep apnea 03/12/2013  . Spinal stenosis, lumbar region, with neurogenic claudication 12/14/2012    [facility] ,[place], CCC-SLP  06/23/2016, 10:58 AM  [facility] [address] [place], [place], [address] Phone: [phone]   Fax:  [phone]   Name: [name] MRN: [id number] Date of Birth: [birth year]

## 2016-06-23 NOTE — Therapy (Signed)
[facility] [address] [place], [place], [address] Phone: [phone]   Fax:  [phone]  Occupational Therapy Treatment  Patient Details  Name: [name] MRN: [id number] Date of Birth: 03-19-1951 Referring Provider: Dr. Wells Guiles Tat  Encounter Date: 06/23/2016      OT End of Session - 06/23/16 1009    Visit Number 11   Number of Visits 17   Date for OT Re-Evaluation 07/07/16   Authorization Type [employer] Medicare / [employer], no auth/visit limit, G-code needed   Authorization - Visit Number 11   Authorization - Number of Visits 20   OT Start Time 0933   OT Stop Time 1015   OT Time Calculation (min) 42 min   Activity Tolerance Patient tolerated treatment well   Behavior During Therapy [facility] for tasks assessed/performed      Past Medical History:  Diagnosis Date  . Arthritis   . Bradycardia   . Cancer [facility]) 2013   skin cancer  . Depression    ptsd  . Dysrhythmia    chronic slow heart rate  . GERD (gastroesophageal reflux disease)   . Headache(784.0)    tension headaches non recent  . History of chicken pox   . History of kidney stones   . Hypertension    treated with HCTZ  . Parkinson's disease ([place])    dx'ed 15 years ago  . PTSD (post-traumatic stress disorder)   . Shortness of breath dyspnea   . Sleep apnea    doesn't use C-pap  . Varicose veins     Past Surgical History:  Procedure Laterality Date  . CHOLECYSTECTOMY N/A 10/22/2014   Procedure: LAPAROSCOPIC CHOLECYSTECTOMY WITH INTRAOPERATIVE CHOLANGIOGRAM;  Surgeon: Dia Crawford III, MD;  Location: [facility] ORS;  Service: General;  Laterality: N/A;  . cyst removed      from lip as a child  . LUMBAR LAMINECTOMY/DECOMPRESSION MICRODISCECTOMY Bilateral 12/14/2012   Procedure: Bilateral lumbar three-four, four-five decompressive laminotomy/foraminotomy;  Surgeon: Charlie Pitter, MD;  Location: [place] NEURO ORS;  Service: Neurosurgery;  Laterality: Bilateral;  .  PULSE GENERATOR IMPLANT Bilateral 12/13/2013   Procedure: Bilateral implantable pulse generator placement;  Surgeon: Erline Levine, MD;  Location: [place] [facility] ORS;  Service: Neurosurgery;  Laterality: Bilateral;  Bilateral implantable pulse generator placement  . skin cancer removed     from ears,   12 lft arm  rt leg 15  . SUBTHALAMIC STIMULATOR INSERTION Bilateral 12/06/2013   Procedure: SUBTHALAMIC STIMULATOR INSERTION;  Surgeon: Erline Levine, MD;  Location: [place] NEURO ORS;  Service: Neurosurgery;  Laterality: Bilateral;  Bilateral deep brain stimulator placement    There were no vitals filed for this visit.      Subjective Assessment - 06/23/16 0936    Pertinent History Parkinson's diagnosis approx 17 yrs ago, hx of 3 back surgeries (last approx 2 yrs ago), bilateral DBS placement approx 3 yrs ago, multiple falls, R 3rd digit fx 10/2015, HTN, bradycardia, PTSD, lumbar stenosis, HDL, Dupuytren contracture, depression, gout    Limitations Bilateral Deep Brain Stimulator; Fall risk   Patient Stated Goals safety per wife   Currently in Pain? Yes   Pain Score 6    Pain Location Shoulder   Pain Orientation Right;Left   Pain Descriptors / Indicators Sore   Pain Type Chronic pain   Pain Onset More than a month ago   Pain Frequency Intermittent   Aggravating Factors  fall movement   Pain Relieving Factors unsure   Multiple Pain Sites  No            Pt practiced getting up and down from floor using safe technique, holding onto mat pt performed with min v.c. Pt reports back pain after working under a car, therapist encouraged pt to let his friends get up under the car to prevent risk for falls, bask pain, injury.  Handwriting activities, writing continuous "l"then writing sentences and using sentences with emphasis on larger letter size.           PWR [facility]) - 06/23/16 1010    PWR! exercises Moves in supine   PWR! Up x10   PWR! Rock [employer]! Twist x20   Comments min v.c. and pt  returned demonstration  PWR!Hands   PWR! Up x10   PWR! Rock x10   PWR! Twist x10   Comments min-mod v.c and demonstration               OT Short Term Goals - 06/23/16 1000      OT SHORT TERM GOAL #1   Title Pt/caregiver will be independent with PD-specific HEP.--check STGs 06/09/16   Time 4   Period Weeks   Status Achieved     OT SHORT TERM GOAL #2   Title Pt will improve coordination for ADLs as shown by improving time on 9-hole peg test by at least 5sec with dominant RUE.   Baseline 60.56sec   Time 4   Period Weeks   Status Achieved  06/07/16:  46.97sec after PWR! hands (no change without PWR! hands)     OT SHORT TERM GOAL #3   Title Pt will improve coordination/functional reaching for ADLs as shown by improving box and blocks score by at least 4 bilaterally.   Baseline R-29, L-33 blocks   Time 4   Period Weeks   Status On-going  06/07/16:  R-26 blocks, L-33blocks      OT SHORT TERM GOAL #4   Title Pt will incr ease with eating as shown by completing PPT#2 in 15sec or less.   Baseline 17.53sec   Time 4   Period Weeks   Status Partially Met  16.09 secs, 14.50 secs(met second trial)     OT SHORT TERM GOAL #5   Title Pt/caregiver will verbalize ways to prevent future complications and appropriate community resources prn.   Time 4   Period Weeks   Status Achieved           OT Long Term Goals - 06/16/16 0910      OT LONG TERM GOAL #1   Title Pt/caregiver will verbalize understanding of adaptive strategies/equipment to incr ease/safety/independence with ADLs/IADLs.--check LTGs 07/07/16   Baseline -----   Time 8   Period Weeks   Status On-going     OT LONG TERM GOAL #2   Title Pt will improve coordination for ADLs as shown by improving time on 9-hole peg test by at least 10sec with dominant RUE.   Baseline 60.56sec   Time 8   Period Weeks   Status On-going     OT LONG TERM GOAL #3   Title Pt will improve coordination/functional reaching for ADLs as  shown by improving box and blocks score by at least 8 with RUE.   Baseline R-29 blocks   Time 8   Period Weeks   Status On-going     OT LONG TERM GOAL #4   Title Pt will fasten/unfasten 3 buttons in less than 45sec for incr ease with dressing.  Baseline 53.0sec   Time 8   Period Weeks   Status On-going     OT LONG TERM GOAL #5   Title Pt will improve balance for ADLs/IADLs as shown by improving standing functional reach test by at least 2 inches with LUE.   Baseline R-8", L-6"   Time 8   Period Weeks   Status On-going     OT LONG TERM GOAL #6   Title ---------   Baseline -------     OT LONG TERM GOAL #7   Title -----   Baseline ------               Plan - 06/23/16 1006    Clinical Impression Statement Pt is progressing towards goals. He demonstrates understanding of HEP.    Rehab Potential Fair   Clinical Impairments Affecting Rehab Potential cognitive deficits, severity of deficits, hip pain   OT Frequency 2x / week   OT Duration 8 weeks   OT Treatment/Interventions Self-care/ADL training;Moist Heat;Cryotherapy;Therapeutic exercise;Parrafin;DME and/or AE instruction;Therapist, nutritional;Therapeutic activities;Patient/family education;Balance training;Cognitive remediation/compensation;Splinting;Manual Therapy;Neuromuscular education;Ultrasound;Fluidtherapy;Energy conservation;Passive range of motion;Therapeutic exercises   Plan check goals, anticipate d/c   OT Home Exercise Plan PWR! supine, basic coordination(cards only), PWR! up seated   Consulted and Agree with Plan of Care Patient      Patient will benefit from skilled therapeutic intervention in order to improve the following deficits and impairments:  Decreased mobility, Decreased balance, Abnormal gait, Pain, Impaired tone, Impaired perceived functional ability, Decreased knowledge of precautions, Decreased coordination, Decreased activity tolerance, Decreased cognition, Decreased knowledge of use of  DME, Decreased safety awareness, Decreased strength, Impaired UE functional use, Improper spinal/pelvic alignment, Improper body mechanics  Visit Diagnosis: Other lack of coordination  Other symptoms and signs involving the nervous system  Other abnormalities of gait and mobility  Frontal lobe and executive function deficit  Unsteadiness on feet    Problem List Patient Active Problem List   Diagnosis Date Noted  . Trochanteric bursitis 03/03/2016  . Left hand pain 10/30/2015  . Fracture, finger, multiple sites 10/30/2015  . Colon cancer screening 07/23/2015  . Screening examination for infectious disease 07/23/2015  . Gout 02/18/2015  . Depression 01/06/2015  . Irritation of eyelid 08/20/2014  . Dupuytren's contracture 08/20/2014  . Cough 07/21/2014  . Lumbar stenosis with neurogenic claudication 06/13/2014  . Preop cardiovascular exam 05/28/2014  . S/P deep brain stimulator placement 05/08/2014  . PD (Parkinson's disease) ([place]) 05/08/2014  . Joint pain 01/22/2014  . Medicare annual wellness visit, initial 01/19/2014  . Advance care planning 01/19/2014  . Parkinson's disease ([place]) 12/13/2013  . PTSD (post-traumatic stress disorder) 06/13/2013  . Erectile dysfunction 06/13/2013  . HLD (hyperlipidemia) 06/13/2013  . Hip pain 06/10/2013  . Pain in joint, shoulder region 06/10/2013  . Right leg swelling 06/03/2013  . Essential hypertension 06/03/2013  . Bradycardia by electrocardiogram 06/03/2013  . Obstructive sleep apnea 03/12/2013  . Spinal stenosis, lumbar region, with neurogenic claudication 12/14/2012    Marquasha Brutus 06/23/2016, 12:21 PM  [facility] [address] [place], [place], [address] Phone: [phone]   Fax:  [phone]  Name: VONNIE LIGMAN MRN: [id number] Date of Birth: Dec 21, 1950

## 2016-06-23 NOTE — Therapy (Signed)
[facility] [address] [place], [place], [address] Phone: [phone]   Fax:  [phone]  Physical Therapy Treatment  Patient Details  Name: [name] MRN: [id number] Date of Birth: Feb 19, 1951 Referring Provider: Alonza Bogus  Encounter Date: 06/23/2016      PT End of Session - 06/23/16 1359    Visit Number 10   Number of Visits 13   Date for PT Re-Evaluation 49/20/10  recert this visit for 1 additional week-   Authorization Type [employer] Medicare PPO-GCODE every 10th visit   PT Start Time 1018   PT Stop Time 1100   PT Time Calculation (min) 42 min   Equipment Utilized During Treatment Gait belt   Activity Tolerance Patient tolerated treatment well   Behavior During Therapy [facility] for tasks assessed/performed      Past Medical History:  Diagnosis Date  . Arthritis   . Bradycardia   . Cancer [facility]) 2013   skin cancer  . Depression    ptsd  . Dysrhythmia    chronic slow heart rate  . GERD (gastroesophageal reflux disease)   . Headache(784.0)    tension headaches non recent  . History of chicken pox   . History of kidney stones   . Hypertension    treated with HCTZ  . Parkinson's disease ([place])    dx'ed 15 years ago  . PTSD (post-traumatic stress disorder)   . Shortness of breath dyspnea   . Sleep apnea    doesn't use C-pap  . Varicose veins     Past Surgical History:  Procedure Laterality Date  . CHOLECYSTECTOMY N/A 10/22/2014   Procedure: LAPAROSCOPIC CHOLECYSTECTOMY WITH INTRAOPERATIVE CHOLANGIOGRAM;  Surgeon: Dia Crawford III, MD;  Location: [facility] ORS;  Service: General;  Laterality: N/A;  . cyst removed      from lip as a child  . LUMBAR LAMINECTOMY/DECOMPRESSION MICRODISCECTOMY Bilateral 12/14/2012   Procedure: Bilateral lumbar three-four, four-five decompressive laminotomy/foraminotomy;  Surgeon: Charlie Pitter, MD;  Location: [place] NEURO ORS;  Service: Neurosurgery;  Laterality: Bilateral;  . PULSE  GENERATOR IMPLANT Bilateral 12/13/2013   Procedure: Bilateral implantable pulse generator placement;  Surgeon: Erline Levine, MD;  Location: [place] NEURO ORS;  Service: Neurosurgery;  Laterality: Bilateral;  Bilateral implantable pulse generator placement  . skin cancer removed     from ears,   12 lft arm  rt leg 15  . SUBTHALAMIC STIMULATOR INSERTION Bilateral 12/06/2013   Procedure: SUBTHALAMIC STIMULATOR INSERTION;  Surgeon: Erline Levine, MD;  Location: [place] NEURO ORS;  Service: Neurosurgery;  Laterality: Bilateral;  Bilateral deep brain stimulator placement    There were no vitals filed for this visit.      Subjective Assessment - 06/23/16 1020    Subjective Nothing new, no falls since last visit   Patient is accompained by: Family member   Patient Stated Goals Pt's goal for therapy is to help with legs and with balance.   Currently in Pain? Yes   Pain Score 5    Pain Location Shoulder   Pain Orientation Right;Left   Pain Descriptors / Indicators Sore   Pain Type Chronic pain   Pain Onset More than a month ago  more acute due to fall   Pain Frequency Intermittent   Aggravating Factors  lying on concrete working on car   Pain Relieving Factors unsure what alleviates   Effect of Pain on Daily Activities OT aware   Multiple Pain Sites No  [facility] Adult PT Treatment/Exercise - 06/23/16 1028      Transfers   Transfers Sit to Stand;Stand to Sit   Sit to Stand 6: Modified independent (Device/Increase time)   Five time sit to stand comments  17.90   Stand to Sit 6: Modified independent (Device/Increase time)     Ambulation/Gait   Ambulation/Gait Yes   Ambulation/Gait Assistance 5: Supervision;6: Modified independent (Device/Increase time)   Ambulation Distance (Feet) 350 Feet  230   Assistive device --  U-step RW   Gait Pattern Step-through pattern;Decreased step length - right;Decreased step length - left;Decreased trunk rotation;Narrow base of  support;Decreased arm swing - right;Poor foot clearance - left;Poor foot clearance - right  Overall slowed gait pattern   Ambulation Surface Level;Indoor   Gait velocity 20.28 sec = 1.62 ft/sec   Pre-Gait Activities Short distance gait activities at counter without U-step RW (<10 ft), 2-3 reps, with min guard assistance   Gait Comments Gait activities today with U-step RW, with conversational tasks, with continued slowed gait and with no LOB, no veering during conversational tasks.     Timed Up and Go Test   TUG Normal TUG   Normal TUG (seconds) 21.88  with U-step RW     Self-Care   Self-Care Other Self-Care Comments   Other Self-Care Comments  Wife not present today, and pt missed earlier appointment this week due to wife sick; therefore discussed modifying d/c plans (had planned to d/c this visit, but would like pt/wife to both be at session for discharge); discussed gait safety, POC-to likely discharge next week; reviewed importance of transition to community fitness; pt reports being member at [employer], but not yet going.  Discussed importance of walking for exercise at home.            Balance Exercises - 06/23/16 1355      Balance Exercises: Standing   Stepping Strategy Anterior;Posterior;Lateral;UE support;10 reps  UE supported at counter-review of HEP-pt return demo [employer] Gait Other (comment);5 reps;Upper extremity support  forward/back walking   Sidestepping 3 reps;Upper extremity support   Marching Limitations Marching forward/marching back x 3 reps at counter   Other Standing Exercises Stagger stance forward/back weightshifting x 15 reps each leg             PT Short Term Goals - 06/07/16 0941      PT SHORT TERM GOAL #1   Title Pt will perform HEP with family's supervision, for improved balance, transfers, and gait.  TARGET 06/08/16   Baseline Pt has been instructed in HEP for lower extremity stretches, and appears to be performing.   Time 4    Period Weeks   Status Achieved     PT SHORT TERM GOAL #2   Title Pt will perform 5x sit<>stand in less than or equal to 15 seconds for improved efficiency and safety with transfers.   Baseline 5x sit<>stand 18.75 sec- 06/07/16   Time 4   Status Not Met     PT SHORT TERM GOAL #3   Title Pt will improve TUG score to less than or equal to 13.5 seconds for decreased fall risk.   Baseline TUG with rollator25.87 sec 06/07/16   Time 4   Period Weeks   Status Not Met     PT SHORT TERM GOAL #4   Title Pt will perform floor>stand transfer with min assist of family member for improved fall recovery.   Baseline min guard assistance iwth UE support 06/01/16 visit  Time 4   Period Weeks   Status Achieved     PT SHORT TERM GOAL #5   Title Pt will verbalize at least 3 means to decrease pain along R lateral thigh/iliotibial band.   Time 4   Period Weeks   Status On-going           PT Long Term Goals - July 07, 2016 1400      PT LONG TERM GOAL #1   Title Pt/family will verabalize understanding of fall prevention/tips to reduce freezing with gait in the home environment.  TARGET 07/07/16   Time 1  per recert 05/26/29   Period Weeks   Status On-going     PT LONG TERM GOAL #2   Title Pt will improve gait velocity to at least 2.62 ft/sec for improved community ambulator status (with appropriate assistive device).   Baseline gait velocity 1.62 ft/sec Jul 07, 2016   Time 6   Period Weeks   Status Not Met     PT LONG TERM GOAL #3   Title Pt will report at least 25% improvement in performance of low surface transfers for improved ability to get in and out of car as well as restaraunt seating.    Time 1  per recert 08/17/74   Period Weeks   Status On-going     PT LONG TERM GOAL #4   Title Pt/wife will verbalize understanding of plans for continued community fitness upon D/C from PT.   Time 1  per recert 04/23/05   Period Weeks   Status On-going     PT LONG TERM GOAL #5   Title Pt will demonstrate  proper use and technique of U-step RW for improved household and community gait.   Baseline Pt declined outdoor gait with U-step this visit upon PT request   Time 1  per recert 06/22/08   Period Weeks   Status On-going               Plan - 07/07/16 1402    Clinical Impression Statement PT had planned on discharge this visit; however, pt missed earlier appointment this week and wife not present today for PT education for d/c.  Will plan to recert for additional 1 week for pt education regarding safety, gait, transition to community fitness.  LTG 2 not met (pt safer at lower gait velocity with U-step RW); remaining LTGs to be carried forward on recert through next week.   Rehab Potential Good   PT Frequency 2x / week   PT Duration --  1 additional week-recert 09/18/67   PT Treatment/Interventions ADLs/Self Care Home Management;Functional mobility training;Gait training;DME Instruction;Neuromuscular re-education;Patient/family education;Balance training;Therapeutic exercise;Therapeutic activities;Manual techniques   PT Next Visit Plan Check remaining LTGs with wife present for discussion on transition to community fitness, gait safety; try gait outdoor surfaces with U-step RW   Consulted and Agree with Plan of Care Patient      Patient will benefit from skilled therapeutic intervention in order to improve the following deficits and impairments:  Abnormal gait, Decreased balance, Decreased mobility, Decreased knowledge of use of DME, Decreased safety awareness, Decreased strength, Difficulty walking, Postural dysfunction, Pain  Visit Diagnosis: Other abnormalities of gait and mobility  Unsteadiness on feet       G-Codes - 07-07-2016 1404    Functional Assessment Tool Used (Outpatient Only) TUG 21.88 sec, 5x sit<>stand 17.90 sec, gait velocity 1.62 ft/sec with U-step RW; several falls per week (avg)-when not using U-step RW   Functional Limitation Mobility: Walking and  moving around    Mobility: Walking and Moving Around Current Status [phone]) At least 20 percent but less than 40 percent impaired, limited or restricted   Mobility: Walking and Moving Around Goal Status [phone]) At least 20 percent but less than 40 percent impaired, limited or restricted      Problem List Patient Active Problem List   Diagnosis Date Noted  . Trochanteric bursitis 03/03/2016  . Left hand pain 10/30/2015  . Fracture, finger, multiple sites 10/30/2015  . Colon cancer screening 07/23/2015  . Screening examination for infectious disease 07/23/2015  . Gout 02/18/2015  . Depression 01/06/2015  . Irritation of eyelid 08/20/2014  . Dupuytren's contracture 08/20/2014  . Cough 07/21/2014  . Lumbar stenosis with neurogenic claudication 06/13/2014  . Preop cardiovascular exam 05/28/2014  . S/P deep brain stimulator placement 05/08/2014  . PD (Parkinson's disease) ([place]) 05/08/2014  . Joint pain 01/22/2014  . Medicare annual wellness visit, initial 01/19/2014  . Advance care planning 01/19/2014  . Parkinson's disease ([place]) 12/13/2013  . PTSD (post-traumatic stress disorder) 06/13/2013  . Erectile dysfunction 06/13/2013  . HLD (hyperlipidemia) 06/13/2013  . Hip pain 06/10/2013  . Pain in joint, shoulder region 06/10/2013  . Right leg swelling 06/03/2013  . Essential hypertension 06/03/2013  . Bradycardia by electrocardiogram 06/03/2013  . Obstructive sleep apnea 03/12/2013  . Spinal stenosis, lumbar region, with neurogenic claudication 12/14/2012    Inari Shin W. 06/23/2016, 2:12 PM  Frazier Butt., PT  [facility] [address] [place], [place], [address] Phone: [phone]   Fax:  [phone]  Name: NESTA KIMPLE MRN: [id number] Date of Birth: 03/17/1951   Physical Therapy Progress Note  Dates of Reporting Period: 05/09/16 to 06/23/16  Objective Reports of Subjective Statement: Reports no falls since last visit; in  general, pt reports falls only occur when not using U-step RW  Objective Measurements: TUG 21.88 sec with U-step RW, gait velocity 1.62 ft/sec with U-step RW, 5x sit<>stand 17.90 sec  Goal Update: LTG 2 not met (slower gait velocity with U-step RW, but pt is safer with slowed gait); remaining LTGs ongoing   Plan: plans for discharge next week-with additional pt/wife education on gait safety, transfers, and transition to community fitness  Reason Skilled Services are Required: Pt at fall risk, with final discharge instructions ideally given to pt and to wife for optimal carryover of safety and education.  Mady Haagensen, PT 06/23/16 2:12 PM Phone: [phone] Fax: [phone]

## 2016-06-27 ENCOUNTER — Ambulatory Visit: Payer: Medicare PPO | Admitting: Rehabilitative and Restorative Service Providers"

## 2016-06-27 ENCOUNTER — Ambulatory Visit: Payer: Medicare PPO

## 2016-06-27 ENCOUNTER — Ambulatory Visit: Payer: Medicare PPO | Admitting: Occupational Therapy

## 2016-06-27 DIAGNOSIS — R41841 Cognitive communication deficit: Secondary | ICD-10-CM

## 2016-06-27 DIAGNOSIS — R41844 Frontal lobe and executive function deficit: Secondary | ICD-10-CM | POA: Diagnosis not present

## 2016-06-27 DIAGNOSIS — R2689 Other abnormalities of gait and mobility: Secondary | ICD-10-CM | POA: Diagnosis not present

## 2016-06-27 DIAGNOSIS — R293 Abnormal posture: Secondary | ICD-10-CM

## 2016-06-27 DIAGNOSIS — R278 Other lack of coordination: Secondary | ICD-10-CM

## 2016-06-27 DIAGNOSIS — M6281 Muscle weakness (generalized): Secondary | ICD-10-CM | POA: Diagnosis not present

## 2016-06-27 DIAGNOSIS — R29818 Other symptoms and signs involving the nervous system: Secondary | ICD-10-CM

## 2016-06-27 DIAGNOSIS — R2681 Unsteadiness on feet: Secondary | ICD-10-CM

## 2016-06-27 DIAGNOSIS — R471 Dysarthria and anarthria: Secondary | ICD-10-CM

## 2016-06-27 DIAGNOSIS — R29898 Other symptoms and signs involving the musculoskeletal system: Secondary | ICD-10-CM | POA: Diagnosis not present

## 2016-06-27 DIAGNOSIS — R1312 Dysphagia, oropharyngeal phase: Secondary | ICD-10-CM

## 2016-06-27 NOTE — Therapy (Signed)
[facility] [address] [place], [place], [address] Phone: [phone]   Fax:  [phone]  Speech Language Pathology Treatment  Patient Details  Name: [name] MRN: [id number] Date of Birth: 08/06/1950 Referring Provider: Alonza Bogus, DO  Encounter Date: 06/27/2016      End of Session - 06/27/16 1442    Visit Number 11   Number of Visits 17   Date for SLP Re-Evaluation 07/22/16   SLP Start Time 68   SLP Stop Time  1100   SLP Time Calculation (min) 41 min   Activity Tolerance Patient limited by lethargy      Past Medical History:  Diagnosis Date  . Arthritis   . Bradycardia   . Cancer [facility]) 2013   skin cancer  . Depression    ptsd  . Dysrhythmia    chronic slow heart rate  . GERD (gastroesophageal reflux disease)   . Headache(784.0)    tension headaches non recent  . History of chicken pox   . History of kidney stones   . Hypertension    treated with HCTZ  . Parkinson's disease ([place])    dx'ed 15 years ago  . PTSD (post-traumatic stress disorder)   . Shortness of breath dyspnea   . Sleep apnea    doesn't use C-pap  . Varicose veins     Past Surgical History:  Procedure Laterality Date  . CHOLECYSTECTOMY N/A 10/22/2014   Procedure: LAPAROSCOPIC CHOLECYSTECTOMY WITH INTRAOPERATIVE CHOLANGIOGRAM;  Surgeon: Dia Crawford III, MD;  Location: [facility] ORS;  Service: General;  Laterality: N/A;  . cyst removed      from lip as a child  . LUMBAR LAMINECTOMY/DECOMPRESSION MICRODISCECTOMY Bilateral 12/14/2012   Procedure: Bilateral lumbar three-four, four-five decompressive laminotomy/foraminotomy;  Surgeon: Charlie Pitter, MD;  Location: [place] NEURO ORS;  Service: Neurosurgery;  Laterality: Bilateral;  . PULSE GENERATOR IMPLANT Bilateral 12/13/2013   Procedure: Bilateral implantable pulse generator placement;  Surgeon: Erline Levine, MD;  Location: [place] NEUR[facility] ORS;  Service: Neurosurgery;  Laterality: Bilateral;   Bilateral implantable pulse generator placement  . skin cancer removed     from ears,   12 lft arm  rt leg 15  . SUBTHALAMIC STIMULATOR INSERTION Bilateral 12/06/2013   Procedure: SUBTHALAMIC STIMULATOR INSERTION;  Surgeon: Erline Levine, MD;  Location: [place] NEURO ORS;  Service: Neurosurgery;  Laterality: Bilateral;  Bilateral deep brain stimulator placement    There were no vitals filed for this visit.      Subjective Assessment - 06/27/16 1024    Subjective Pt moved chair over to him at the same time as he sat down in chair, when arriving in [facility] room.   Currently in Pain? No/denies               ADULT SLP TREATMENT - 06/27/16 1025      General Information   Behavior/Cognition Alert;Cooperative;Pleasant mood;Impulsive     Treatment Provided   Treatment provided Cognitive-Linquistic     Dysphagia Treatment   Temperature Spikes Noted No   Respiratory Status Room air   Oral Cavity - Dentition Adequate natural dentition   Treatment Methods Therapeutic exercise;Patient/caregiver education   Patient observed directly with PO's Yes   Type of PO's observed Dysphagia 3 (soft);Thin liquids   Oral Phase Signs & Symptoms --  none noted   Pharyngeal Phase Signs & Symptoms Delayed cough  on 1/9 sips   Type of cueing Verbal   Amount of cueing Minimal   Other treatment/comments  Min cues needed for swallowing precautions with SLP watching pt in therapy room. Despite pt stating he completed dysphagia HEP x1/day, pt req'd min-mod A usually with HEP for proper procedure. Educated pt re: rationale for swallow precautions. SLP reminded pt of precautions sheet he is supposed to have near his place when eating.     Cognitive-Linquistic Treatment   Treatment focused on Dysarthria   Skilled Treatment Pt req'd mod-max A usually with speech clarity/intelligibility tasks today, at phrase level. SLP used a digital recorder to show pt decr'd intelligibility. SLP also used demo cues for  overarticulation.     Assessment / Recommendations / Plan   Plan Continue with current plan of care  likely d/c next visit     Dysphagia Recommendations   Diet recommendations Regular;Dysphagia 3 (mechanical soft);Thin liquid   Liquids provided via Cup   Supervision Intermittent supervision to cue for compensatory strategies   Compensations Small sips/bites;Slow rate;Multiple dry swallows after each bite/sip     Progression Toward Goals   Progression toward goals Not progressing toward goals (comment)  pt looks much like last week; ? homework/HEP completion          SLP Education - 06/27/16 1442    Education provided Yes   Education Details HEP, swallow precautions   Person(s) Educated Patient   Methods Explanation;Demonstration;Verbal cues   Comprehension Verbalized understanding;Returned demonstration;Verbal cues required;Need further instruction          SLP Short Term Goals - 06/14/16 1223      SLP SHORT TERM GOAL #1   Title pt will maintain speech volume appropriate for 80% intelligibilty in 3 minutes simple conversation with rare min A   Status Not Met     SLP SHORT TERM GOAL #2   Title pt will demo emergent awareness re: reduced speech loudness in conversation, by repeating hjimself spontaneously 20% of opportunities   Status Not Met     SLP SHORT TERM GOAL #3   Title pt will demo knowledge of memory compensations during 3 therapy sessions   Status Deferred     SLP SHORT TERM GOAL #4   Title pt will perform dysphagia HEP with occasional min cues over two sessions    Status Partially Met          SLP Long Term Goals - 06/27/16 1052      SLP LONG TERM GOAL #1   Title pt will maintain speech volume adequate to achieve at least 90% intelligibility in a noisy environment over 5 minutes of simple conversation over two sessions   Time 2   Period Weeks   Status Revised     SLP LONG TERM GOAL #2   Title pt will demo emergent awareness of reduced  intelligibility in 5 minutes conversation by self-repeating when given non-verbal cues   Time 2   Period Weeks   Status Revised     SLP LONG TERM GOAL #3   Title pt will complete dysphagia HEP with rare min cues over three sessions   Time 2   Period Weeks   Status On-going          Plan - 06/27/16 1442    Clinical Impression Statement Today pt demonstrated continued reduced communicative effectiveness due to decr'd speech loudness and rushes of speech, again with limited awareness in strucutred sentence tasks, and in conversation. Pt also req'd cues usually for proper HEP completion. Skilled ST remains needed to improve pt's speech clarity as well as strengthen his swallowing  musculature. Next visit will verly likely be pt's last visit due to maximized rehab potential at this time.   Speech Therapy Frequency 2x / week   Duration --  8 weeks   Treatment/Interventions Compensatory techniques;Cognitive reorganization;SLP instruction and feedback;Internal/external aids;Functional tasks;Patient/family education;Cueing hierarchy   Potential to Achieve Goals Good   Potential Considerations Ability to learn/carryover information;Cooperation/participation level;Severity of impairments   SLP Home Exercise Plan provided today   Consulted and Agree with Plan of Care Patient      Patient will benefit from skilled therapeutic intervention in order to improve the following deficits and impairments:   Dysarthria and anarthria  Dysphagia, oropharyngeal  Cognitive communication deficit    Problem List Patient Active Problem List   Diagnosis Date Noted  . Trochanteric bursitis 03/03/2016  . Left hand pain 10/30/2015  . Fracture, finger, multiple sites 10/30/2015  . Colon cancer screening 07/23/2015  . Screening examination for infectious disease 07/23/2015  . Gout 02/18/2015  . Depression 01/06/2015  . Irritation of eyelid 08/20/2014  . Dupuytren's contracture 08/20/2014  . Cough  07/21/2014  . Lumbar stenosis with neurogenic claudication 06/13/2014  . Preop cardiovascular exam 05/28/2014  . S/P deep brain stimulator placement 05/08/2014  . PD (Parkinson's disease) ([place]) 05/08/2014  . Joint pain 01/22/2014  . Medicare annual wellness visit, initial 01/19/2014  . Advance care planning 01/19/2014  . Parkinson's disease ([place]) 12/13/2013  . PTSD (post-traumatic stress disorder) 06/13/2013  . Erectile dysfunction 06/13/2013  . HLD (hyperlipidemia) 06/13/2013  . Hip pain 06/10/2013  . Pain in joint, shoulder region 06/10/2013  . Right leg swelling 06/03/2013  . Essential hypertension 06/03/2013  . Bradycardia by electrocardiogram 06/03/2013  . Obstructive sleep apnea 03/12/2013  . Spinal stenosis, lumbar region, with neurogenic claudication 12/14/2012    [facility] ,[place], CCC-SLP  06/27/2016, 2:44 PM  [facility] [address] [place], [place], [address] Phone: [phone]   Fax:  [phone]   Name: [name] MRN: [id number] Date of Birth: [birth year]

## 2016-06-27 NOTE — Patient Instructions (Signed)
Remember to look at your swallowing precautions sheet at your place at the table.

## 2016-06-27 NOTE — Therapy (Signed)
[facility] [address] [place], [place], [address] Phone: [phone]   Fax:  [phone]  Physical Therapy Treatment  Patient Details  Name: [name] MRN: [id number] Date of Birth: 10-Jul-1950 Referring Provider: Alonza Bogus  Encounter Date: 06/27/2016      PT End of Session - 06/27/16 1229    Visit Number 11   Number of Visits 13   Date for PT Re-Evaluation 81/44/81  recert this visit for 1 additional week-   Authorization Type [employer] PPO-GCODE every 10th visit   PT Start Time 1148   PT Stop Time 1230   PT Time Calculation (min) 42 min   Equipment Utilized During Treatment Gait belt   Activity Tolerance Patient tolerated treatment well   Behavior During Therapy [facility] for tasks assessed/performed      Past Medical History:  Diagnosis Date  . Arthritis   . Bradycardia   . Cancer [facility]) 2013   skin cancer  . Depression    ptsd  . Dysrhythmia    chronic slow heart rate  . GERD (gastroesophageal reflux disease)   . Headache(784.0)    tension headaches non recent  . History of chicken pox   . History of kidney stones   . Hypertension    treated with HCTZ  . Parkinson's disease ([place])    dx'ed 15 years ago  . PTSD (post-traumatic stress disorder)   . Shortness of breath dyspnea   . Sleep apnea    doesn't use C-pap  . Varicose veins     Past Surgical History:  Procedure Laterality Date  . CHOLECYSTECTOMY N/A 10/22/2014   Procedure: LAPAROSCOPIC CHOLECYSTECTOMY WITH INTRAOPERATIVE CHOLANGIOGRAM;  Surgeon: Dia Crawford III, MD;  Location: [facility] ORS;  Service: General;  Laterality: N/A;  . cyst removed      from lip as a child  . LUMBAR LAMINECTOMY/DECOMPRESSION MICRODISCECTOMY Bilateral 12/14/2012   Procedure: Bilateral lumbar three-four, four-five decompressive laminotomy/foraminotomy;  Surgeon: Charlie Pitter, MD;  Location: [place] NEURO ORS;  Service: Neurosurgery;  Laterality: Bilateral;  . PULSE  GENERATOR IMPLANT Bilateral 12/13/2013   Procedure: Bilateral implantable pulse generator placement;  Surgeon: Erline Levine, MD;  Location: [place] NEURO ORS;  Service: Neurosurgery;  Laterality: Bilateral;  Bilateral implantable pulse generator placement  . skin cancer removed     from ears,   12 lft arm  rt leg 15  . SUBTHALAMIC STIMULATOR INSERTION Bilateral 12/06/2013   Procedure: SUBTHALAMIC STIMULATOR INSERTION;  Surgeon: Erline Levine, MD;  Location: [place] NEURO ORS;  Service: Neurosurgery;  Laterality: Bilateral;  Bilateral deep brain stimulator placement    There were no vitals filed for this visit.      Subjective Assessment - 06/27/16 1149    Subjective The patient arrived alone to PT.  He reports less falls since using the U-step and no falls since last week.   Patient Stated Goals Pt's goal for therapy is to help with legs and with balance.   Currently in Pain? Yes   Pain Score 2    Pain Location Hip   Pain Orientation Right   Pain Descriptors / Indicators Sore   Pain Type Chronic pain   Pain Onset More than a month ago   Pain Frequency Intermittent   Aggravating Factors  being on your feet/ walking; when first getting out of the chair.   Pain Relieving Factors unsure             [facility] Adult PT Treatment/Exercise - 06/27/16 1201  Ambulation/Gait   Ambulation/Gait Yes   Ambulation/Gait Assistance 5: Supervision   Ambulation Distance (Feet) 400 Feet   Assistive device 4-wheeled walker  U-step walker   Gait Pattern Step-through pattern;Decreased step length - right;Decreased step length - left;Decreased trunk rotation;Narrow base of support;Decreased arm swing - right;Poor foot clearance - left;Poor foot clearance - right  Overall slowed gait pattern   Ambulation Surface Indoor;Level   Gait Comments Gait activities with u-step RW with slowed, deliberate pace.  Patient able to use u-step on ramp with supervision without loss of balance.       Self-Care   Self-Care  Other Self-Care Comments   Other Self-Care Comments  Patient not performing HEP regularly at this time.  PT and patient discussed performing in routine in order to increase compliance.      Exercises   Exercises Knee/Hip     Knee/Hip Exercises: Stretches   Passive Hamstring Stretch 2 reps;20 seconds;Right;Left   Piriformis Stretch Right;2 reps;20 seconds           PWR [facility]) - 06/27/16 1224    PWR Step x 10   Comments lateral stepping with verbal and visual cues for therapist for correct technique         LSVT [facility]) - 06/27/16 1455    Step and Reach Backward 10 reps  with cues for weight shift and correct technique   Rock and Reach Forward/Backward 10 reps   Rock and Reach Sideways 10 reps              PT Short Term Goals - 06/07/16 0941      PT SHORT TERM GOAL #1   Title Pt will perform HEP with family's supervision, for improved balance, transfers, and gait.  TARGET 06/08/16   Baseline Pt has been instructed in HEP for lower extremity stretches, and appears to be performing.   Time 4   Period Weeks   Status Achieved     PT SHORT TERM GOAL #2   Title Pt will perform 5x sit<>stand in less than or equal to 15 seconds for improved efficiency and safety with transfers.   Baseline 5x sit<>stand 18.75 sec- 06/07/16   Time 4   Status Not Met     PT SHORT TERM GOAL #3   Title Pt will improve TUG score to less than or equal to 13.5 seconds for decreased fall risk.   Baseline TUG with rollator25.87 sec 06/07/16   Time 4   Period Weeks   Status Not Met     PT SHORT TERM GOAL #4   Title Pt will perform floor>stand transfer with min assist of family member for improved fall recovery.   Baseline min guard assistance iwth UE support 06/01/16 visit   Time 4   Period Weeks   Status Achieved     PT SHORT TERM GOAL #5   Title Pt will verbalize at least 3 means to decrease pain along R lateral thigh/iliotibial band.   Time 4   Period Weeks   Status On-going            PT Long Term Goals - 06/23/16 1400      PT LONG TERM GOAL #1   Title Pt/family will verabalize understanding of fall prevention/tips to reduce freezing with gait in the home environment.  TARGET 07/07/16   Time 1  per recert 09/17/81   Period Weeks   Status On-going     PT LONG TERM GOAL #2   Title Pt will improve  gait velocity to at least 2.62 ft/sec for improved community ambulator status (with appropriate assistive device).   Baseline gait velocity 1.62 ft/sec 06/23/16   Time 6   Period Weeks   Status Not Met     PT LONG TERM GOAL #3   Title Pt will report at least 25% improvement in performance of low surface transfers for improved ability to get in and out of car as well as restaraunt seating.    Time 1  per recert 08/21/71   Period Weeks   Status On-going     PT LONG TERM GOAL #4   Title Pt/wife will verbalize understanding of plans for continued community fitness upon D/C from PT.   Time 1  per recert 05/20/00   Period Weeks   Status On-going     PT LONG TERM GOAL #5   Title Pt will demonstrate proper use and technique of U-step RW for improved household and community gait.   Baseline Pt declined outdoor gait with U-step this visit upon PT request   Time 1  per recert 08/19/25   Period Weeks   Status On-going               Plan - 06/27/16 1457    Clinical Impression Statement The patient is scheduled to d/c next visit.  His wife did not attend session today in order to review HEP and community fitness plan.  PT to address at next session.   PT Treatment/Interventions ADLs/Self Care Home Management;Functional mobility training;Gait training;DME Instruction;Neuromuscular re-education;Patient/family education;Balance training;Therapeutic exercise;Therapeutic activities;Manual techniques   PT Next Visit Plan Check LTGs when wife present; discuss community transition/ ways to increase compliance with HEP; continue gait with u-step RW; outdoor gait wiht U-step; G code and  DISCHARGE VISIT.   Consulted and Agree with Plan of Care Patient      Patient will benefit from skilled therapeutic intervention in order to improve the following deficits and impairments:  Abnormal gait, Decreased balance, Decreased mobility, Decreased knowledge of use of DME, Decreased safety awareness, Decreased strength, Difficulty walking, Postural dysfunction, Pain  Visit Diagnosis: No diagnosis found.     Problem List Patient Active Problem List   Diagnosis Date Noted  . Trochanteric bursitis 03/03/2016  . Left hand pain 10/30/2015  . Fracture, finger, multiple sites 10/30/2015  . Colon cancer screening 07/23/2015  . Screening examination for infectious disease 07/23/2015  . Gout 02/18/2015  . Depression 01/06/2015  . Irritation of eyelid 08/20/2014  . Dupuytren's contracture 08/20/2014  . Cough 07/21/2014  . Lumbar stenosis with neurogenic claudication 06/13/2014  . Preop cardiovascular exam 05/28/2014  . S/P deep brain stimulator placement 05/08/2014  . PD (Parkinson's disease) ([place]) 05/08/2014  . Joint pain 01/22/2014  . Medicare annual wellness visit, initial 01/19/2014  . Advance care planning 01/19/2014  . Parkinson's disease ([place]) 12/13/2013  . PTSD (post-traumatic stress disorder) 06/13/2013  . Erectile dysfunction 06/13/2013  . HLD (hyperlipidemia) 06/13/2013  . Hip pain 06/10/2013  . Pain in joint, shoulder region 06/10/2013  . Right leg swelling 06/03/2013  . Essential hypertension 06/03/2013  . Bradycardia by electrocardiogram 06/03/2013  . Obstructive sleep apnea 03/12/2013  . Spinal stenosis, lumbar region, with neurogenic claudication 12/14/2012    Naheim Burgen,PT 06/27/2016, 2:59 PM  [facility] [address] [place], [place], [address] Phone: [phone]   Fax:  [phone]  Name: ILDEFONSO KEANEY MRN: [id number] Date of Birth: June 11, 1950

## 2016-06-27 NOTE — Therapy (Signed)
[facility] [address] [place], [place], [address] Phone: [phone]   Fax:  [phone]  Occupational Therapy Treatment  Patient Details  Name: [name] MRN: [id number] Date of Birth: [birth year] Referring Provider: Dr. Wells Guiles Tat  Encounter Date: 06/27/2016      OT End of Session - 06/27/16 1106    Visit Number 12   Number of Visits 17   Date for OT Re-Evaluation 07/07/16   Authorization Type [employer] Medicare / [employer], no auth/visit limit, G-code needed   Authorization - Visit Number 12   Authorization - Number of Visits 20   OT Start Time 1105   OT Stop Time 1145   OT Time Calculation (min) 40 min   Activity Tolerance Patient tolerated treatment well   Behavior During Therapy [facility] for tasks assessed/performed      Past Medical History:  Diagnosis Date  . Arthritis   . Bradycardia   . Cancer [facility]) 2013   skin cancer  . Depression    ptsd  . Dysrhythmia    chronic slow heart rate  . GERD (gastroesophageal reflux disease)   . Headache(784.0)    tension headaches non recent  . History of chicken pox   . History of kidney stones   . Hypertension    treated with HCTZ  . Parkinson's disease ([place])    dx'ed 15 years ago  . PTSD (post-traumatic stress disorder)   . Shortness of breath dyspnea   . Sleep apnea    doesn't use C-pap  . Varicose veins     Past Surgical History:  Procedure Laterality Date  . CHOLECYSTECTOMY N/A 10/22/2014   Procedure: LAPAROSCOPIC CHOLECYSTECTOMY WITH INTRAOPERATIVE CHOLANGIOGRAM;  Surgeon: Dia Crawford III, MD;  Location: [facility] ORS;  Service: General;  Laterality: N/A;  . cyst removed      from lip as a child  . LUMBAR LAMINECTOMY/DECOMPRESSION MICRODISCECTOMY Bilateral 12/14/2012   Procedure: Bilateral lumbar three-four, four-five decompressive laminotomy/foraminotomy;  Surgeon: Charlie Pitter, MD;  Location: [place] NEURO ORS;  Service: Neurosurgery;  Laterality: Bilateral;   . PULSE GENERATOR IMPLANT Bilateral 12/13/2013   Procedure: Bilateral implantable pulse generator placement;  Surgeon: Erline Levine, MD;  Location: [place] NEURO ORS;  Service: Neurosurgery;  Laterality: Bilateral;  Bilateral implantable pulse generator placement  . skin cancer removed     from ears,   12 lft arm  rt leg 15  . SUBTHALAMIC STIMULATOR INSERTION Bilateral 12/06/2013   Procedure: SUBTHALAMIC STIMULATOR INSERTION;  Surgeon: Erline Levine, MD;  Location: [place] NEURO ORS;  Service: Neurosurgery;  Laterality: Bilateral;  Bilateral deep brain stimulator placement    There were no vitals filed for this visit.      Subjective Assessment - 06/27/16 1106    Subjective  no falls since last session   Pertinent History Parkinson's diagnosis approx 17 yrs ago, hx of 3 back surgeries (last approx 2 yrs ago), bilateral DBS placement approx 3 yrs ago, multiple falls, R 3rd digit fx 10/2015, HTN, bradycardia, PTSD, lumbar stenosis, HDL, Dupuytren contracture, depression, gout    Limitations Bilateral Deep Brain Stimulator; Fall risk   Patient Stated Goals safety per wife   Currently in Pain? No/denies   Pain Onset More than a month ago      Began checking goals--see goal section below.  Practiced writing name with inconsistent performance.  Then continuous "l" with cueing for size and spacing.  Cueing for using lines as guide, slow down, PWR! Hands.  Also recommended built-up  grip for improved grasp and space over more before writing last name.  Practiced fastening/unfastening buttons.  Pt instructed/given mod cueing for strategies of deliberate movements, PWR! Hands, pull-push strategy for unbuttoning.  Pt needed mod cueing for use of strategies, but particularly unfastening improved with strategies.                          OT Short Term Goals - 06/27/16 1110      OT SHORT TERM GOAL #1   Title Pt/caregiver will be independent with PD-specific HEP.--check STGs 06/09/16   Time 4    Period Weeks   Status Achieved     OT SHORT TERM GOAL #2   Title Pt will improve coordination for ADLs as shown by improving time on 9-hole peg test by at least 5sec with dominant RUE.   Baseline 60.56sec   Time 4   Period Weeks   Status Achieved  06/07/16:  46.97sec after PWR! hands (no change without PWR! hands)     OT SHORT TERM GOAL #3   Title Pt will improve coordination/functional reaching for ADLs as shown by improving box and blocks score by at least 4 bilaterally.   Baseline R-29, L-33 blocks   Time 4   Period Weeks   Status Not Met  06/07/16:  R-26 blocks, L-33blocks.  06/27/16  not met 31      OT SHORT TERM GOAL #4   Title Pt will incr ease with eating as shown by completing PPT#2 in 15sec or less.   Baseline 17.53sec   Time 4   Period Weeks   Status Partially Met  16.09 secs, 14.50 secs(met second trial)     OT SHORT TERM GOAL #5   Title Pt/caregiver will verbalize ways to prevent future complications and appropriate community resources prn.   Time 4   Period Weeks   Status Achieved           OT Long Term Goals - 06/27/16 1109      OT LONG TERM GOAL #1   Title Pt/caregiver will verbalize understanding of adaptive strategies/equipment to incr ease/safety/independence with ADLs/IADLs.--check LTGs 07/07/16   Baseline -----   Time 8   Period Weeks   Status On-going     OT LONG TERM GOAL #2   Title Pt will improve coordination for ADLs as shown by improving time on 9-hole peg test by at least 10sec with dominant RUE.   Baseline 60.56sec   Time 8   Period Weeks   Status Achieved  06/27/16:  48.34sec (2 drops)     OT LONG TERM GOAL #3   Title Pt will improve coordination/functional reaching for ADLs as shown by improving box and blocks score by at least 8 with RUE.   Baseline R-29 blocks   Time 8   Period Weeks   Status Not Met  06/27/16:  31 blocks (but with distraction)     OT LONG TERM GOAL #4   Title Pt will fasten/unfasten 3 buttons in less than  45sec for incr ease with dressing.   Baseline 53.0sec   Time 8   Period Weeks   Status Not Met  06/27/16:  inconsistent  38mn 53.03sec (decr carryover of strategies).     OT LONG TERM GOAL #5   Title Pt will improve balance for ADLs/IADLs as shown by improving standing functional reach test by at least 2 inches with LUE.   Baseline R-8", L-6"   Time 8  Period Weeks   Status On-going     OT LONG TERM GOAL #6   Title ---------   Baseline -------     OT LONG TERM GOAL #7   Title -----   Baseline ------               Plan - 06/27/16 1107    Clinical Impression Statement Pt is demo improved coordination, but performance inconsistent at times due to cognition and impulsivity.   Rehab Potential Fair   Clinical Impairments Affecting Rehab Potential cognitive deficits, severity of deficits, hip pain   OT Frequency 2x / week   OT Duration 8 weeks   OT Treatment/Interventions Self-care/ADL training;Moist Heat;Cryotherapy;Therapeutic exercise;Parrafin;DME and/or AE instruction;Therapist, nutritional;Therapeutic activities;Patient/family education;Balance training;Cognitive remediation/compensation;Splinting;Manual Therapy;Neuromuscular education;Ultrasound;Fluidtherapy;Energy conservation;Passive range of motion;Therapeutic exercises   Plan check remaining goals and anticipate d/c   OT Home Exercise Plan PWR! supine, basic coordination(cards only), PWR! up seated   Consulted and Agree with Plan of Care Patient      Patient will benefit from skilled therapeutic intervention in order to improve the following deficits and impairments:  Decreased mobility, Decreased balance, Abnormal gait, Pain, Impaired tone, Impaired perceived functional ability, Decreased knowledge of precautions, Decreased coordination, Decreased activity tolerance, Decreased cognition, Decreased knowledge of use of DME, Decreased safety awareness, Decreased strength, Impaired UE functional use, Improper  spinal/pelvic alignment, Improper body mechanics  Visit Diagnosis: Other symptoms and signs involving the nervous system  Other abnormalities of gait and mobility  Frontal lobe and executive function deficit  Other lack of coordination  Unsteadiness on feet  Abnormal posture  Other symptoms and signs involving the musculoskeletal system    Problem List Patient Active Problem List   Diagnosis Date Noted  . Trochanteric bursitis 03/03/2016  . Left hand pain 10/30/2015  . Fracture, finger, multiple sites 10/30/2015  . Colon cancer screening 07/23/2015  . Screening examination for infectious disease 07/23/2015  . Gout 02/18/2015  . Depression 01/06/2015  . Irritation of eyelid 08/20/2014  . Dupuytren's contracture 08/20/2014  . Cough 07/21/2014  . Lumbar stenosis with neurogenic claudication 06/13/2014  . Preop cardiovascular exam 05/28/2014  . S/P deep brain stimulator placement 05/08/2014  . PD (Parkinson's disease) ([place]) 05/08/2014  . Joint pain 01/22/2014  . Medicare annual wellness visit, initial 01/19/2014  . Advance care planning 01/19/2014  . Parkinson's disease ([place]) 12/13/2013  . PTSD (post-traumatic stress disorder) 06/13/2013  . Erectile dysfunction 06/13/2013  . HLD (hyperlipidemia) 06/13/2013  . Hip pain 06/10/2013  . Pain in joint, shoulder region 06/10/2013  . Right leg swelling 06/03/2013  . Essential hypertension 06/03/2013  . Bradycardia by electrocardiogram 06/03/2013  . Obstructive sleep apnea 03/12/2013  . Spinal stenosis, lumbar region, with neurogenic claudication 12/14/2012    [facility] 06/27/2016, 11:55 AM  [facility] [address] [place], [place], [address] Phone: [phone]   Fax:  [phone]  Name: [name] MRN: [id number] Date of Birth: [birth year]   Vianne Bulls, OTR/L [facility] [address] [place], [place]   [address] [phone] phone [phone] 06/27/16 11:55 AM

## 2016-06-30 ENCOUNTER — Ambulatory Visit: Payer: Medicare PPO

## 2016-06-30 ENCOUNTER — Ambulatory Visit: Payer: Medicare PPO | Admitting: Physical Therapy

## 2016-06-30 ENCOUNTER — Ambulatory Visit: Payer: Medicare PPO | Admitting: Occupational Therapy

## 2016-07-01 DIAGNOSIS — R001 Bradycardia, unspecified: Secondary | ICD-10-CM | POA: Diagnosis not present

## 2016-07-07 ENCOUNTER — Ambulatory Visit: Payer: Medicare PPO | Admitting: Physical Therapy

## 2016-07-11 ENCOUNTER — Encounter: Payer: Self-pay | Admitting: Occupational Therapy

## 2016-07-11 ENCOUNTER — Ambulatory Visit: Payer: Self-pay | Admitting: Neurology

## 2016-07-11 DIAGNOSIS — R2681 Unsteadiness on feet: Secondary | ICD-10-CM | POA: Diagnosis not present

## 2016-07-11 NOTE — Therapy (Signed)
[facility] [address] [place], [place], [address] Phone: [phone]   Fax:  [phone]  Patient Details  Name: [name] MRN: [id number] Date of Birth: 1950-09-14 Referring Provider:  No ref. provider found  Encounter Date: 07/11/2016  OCCUPATIONAL THERAPY DISCHARGE SUMMARY  Visits from Start of Care: 12  Current functional level related to goals / functional outcomes:     OT Short Term Goals - 06/27/16 1110      OT SHORT TERM GOAL #1   Title Pt/caregiver will be independent with PD-specific HEP.--check STGs 06/09/16   Time 4   Period Weeks   Status Achieved     OT SHORT TERM GOAL #2   Title Pt will improve coordination for ADLs as shown by improving time on 9-hole peg test by at least 5sec with dominant RUE.   Baseline 60.56sec   Time 4   Period Weeks   Status Achieved  06/07/16:  46.97sec after PWR! hands (no change without PWR! hands)     OT SHORT TERM GOAL #3   Title Pt will improve coordination/functional reaching for ADLs as shown by improving box and blocks score by at least 4 bilaterally.   Baseline R-29, L-33 blocks   Time 4   Period Weeks   Status Not Met  06/07/16:  R-26 blocks, L-33blocks.  06/27/16  not met 31      OT SHORT TERM GOAL #4   Title Pt will incr ease with eating as shown by completing PPT#2 in 15sec or less.   Baseline 17.53sec   Time 4   Period Weeks   Status Partially Met  16.09 secs, 14.50 secs(met second trial)     OT SHORT TERM GOAL #5   Title Pt/caregiver will verbalize ways to prevent future complications and appropriate community resources prn.   Time 4   Period Weeks   Status Achieved         OT Long Term Goals - 06/27/16 1109      OT LONG TERM GOAL #1   Title Pt/caregiver will verbalize understanding of adaptive strategies/equipment to incr ease/safety/independence with ADLs/IADLs.--check LTGs 07/07/16   Baseline -----   Time 8   Period Weeks   Status  Met, pt needs cues from wife     OT LONG TERM GOAL #2   Title Pt will improve coordination for ADLs as shown by improving time on 9-hole peg test by at least 10sec with dominant RUE.   Baseline 60.56sec   Time 8   Period Weeks   Status Achieved  06/27/16:  48.34sec (2 drops)     OT LONG TERM GOAL #3   Title Pt will improve coordination/functional reaching for ADLs as shown by improving box and blocks score by at least 8 with RUE.   Baseline R-29 blocks   Time 8   Period Weeks   Status Not Met  06/27/16:  31 blocks (but with distraction)     OT LONG TERM GOAL #4   Title Pt will fasten/unfasten 3 buttons in less than 45sec for incr ease with dressing.   Baseline 53.0sec   Time 8   Period Weeks   Status Not Met  06/27/16:  inconsistent  80mn 53.03sec (decr carryover of strategies).     OT LONG TERM GOAL #5   Title Pt will improve balance for ADLs/IADLs as shown by improving standing functional reach test by at least 2 inches with LUE.   Baseline R-8", L-6"  Time 8   Period Weeks   Status Not able to reassess as pt cancelled last OT appt     OT LONG TERM GOAL #6   Title ---------   Baseline -------     OT LONG TERM GOAL #7   Title -----   Baseline ------        Remaining deficits: hypokinesia, rigidity, decr coordination, decr balance/functional mobility for ADLs, impulsivity/decr safety/cognitive deficits, timing deficits, abnormal posture   Education / Equipment: Pt was instructed in the following:  PD-specific HEP, adaptive strategies for ADLs/IADLs, ways to prevent future complications, appropriate community resources.  Pt verbalized understanding of all education provided.       G-Codes - 2016-08-07 1436    Functional Assessment Tool Used (Outpatient only)  9-hole peg test R-38.34; Box and blocks test:  R-31   Functional Limitation Carrying, moving and handling objects   Carrying, Moving and Handling Objects Goal Status [phone]) At least 20 percent but less than 40  percent impaired, limited or restricted   Carrying, Moving and Handling Objects Discharge Status [phone]) At least 20 percent but less than 40 percent impaired, limited or restricted       (pt cancelled last scheduled visit and verbalized agreement to d/c at this time).  Plan: Patient agrees to discharge.  Patient goals were partially met. Patient is being discharged due to                                                     Reaching maximal rehab potential at this time.  Pt would benefit from re-evaluation/occupational therapy screen in approx 6 months to assess for need for further therapy/functional changes due to progressive nature of diagnosis.  ?????        [facility] 07-Aug-2016, 2:29 PM  [facility] [address] [place], [place], [address] Phone: [phone]   Fax:  [place], OTR/L [facility] [address] [place], [place]  [address] [phone] phone [phone] 07-Aug-2016 2:34 PM

## 2016-07-14 ENCOUNTER — Ambulatory Visit (INDEPENDENT_AMBULATORY_CARE_PROVIDER_SITE_OTHER): Payer: Medicare PPO | Admitting: Internal Medicine

## 2016-07-14 ENCOUNTER — Encounter: Payer: Self-pay | Admitting: Neurology

## 2016-07-14 ENCOUNTER — Encounter: Payer: Self-pay | Admitting: Internal Medicine

## 2016-07-14 VITALS — BP 142/80 | HR 38 | Ht 69.0 in | Wt 184.5 lb

## 2016-07-14 DIAGNOSIS — I471 Supraventricular tachycardia: Secondary | ICD-10-CM

## 2016-07-14 DIAGNOSIS — G20 Parkinson's disease: Secondary | ICD-10-CM

## 2016-07-14 DIAGNOSIS — Z9689 Presence of other specified functional implants: Secondary | ICD-10-CM

## 2016-07-14 DIAGNOSIS — R001 Bradycardia, unspecified: Secondary | ICD-10-CM

## 2016-07-14 NOTE — Patient Instructions (Signed)
Medication Instructions: - Your physician recommends that you continue on your current medications as directed. Please refer to the Current Medication list given to you today.  Labwork: - none ordered  Procedures/Testing: - none ordered  Follow-Up: - Dr. Caryl Comes will see you back on as needed basis.  Any Additional Special Instructions Will Be Listed Below (If Applicable).     If you need a refill on your cardiac medications before your next appointment, please call your pharmacy.

## 2016-07-14 NOTE — Progress Notes (Signed)
ELECTROPHYSIOLOGY CONSULT NOTE  Patient ID: [name], MRN: [id number], DOB/AGE: 08-15-1950 66 y.o. Admit date: (Not on file) Date of Consult: 07/14/2016  Primary Physician: Elsie Stain, MD Primary Cardiologist: TG Consulting Physician TG  Chief Complaint: Bradycardia   HPI [name] is a 67 y.o. male  Referred because of bradycardia. He has a history of long-standing stable modest bradycardia with heart rates in the 40s.  He has a history of long-standing Parkinson's disease treated with bilateral deep brain stimulator as well as a variety of medications most recently with the introduction of pimavanserin,  temporally this was associated with development of profound bradycardia with heart rates in the low 30s" difficulty waking up. (I did not see this association in epocrates) Event recorder personally reviewed sinus rates in the 30s at 8 AM. Recurrent short runs of nonsustained atrial tachycardia with rates up to 120 or so.\  Review of the medical record back to 2014 demonstrates heart rates in the range of 40-60 as far back as 2014 at which time he had no appreciable symptoms.  The big concern now is fatigue. He sleeps 12-16 hours ago and is chronically tired   Past Medical History:  Diagnosis Date  . Arthritis   . Bradycardia   . Cancer [facility]) 2013   skin cancer  . Depression    ptsd  . Dysrhythmia    chronic slow heart rate  . GERD (gastroesophageal reflux disease)   . Headache(784.0)    tension headaches non recent  . History of chicken pox   . History of kidney stones   . Hypertension    treated with HCTZ  . Parkinson's disease ([place])    dx'ed 15 years ago  . PTSD (post-traumatic stress disorder)   . Shortness of breath dyspnea   . Sleep apnea    doesn't use C-pap  . Varicose veins       Surgical History:  Past Surgical History:  Procedure Laterality Date  . CHOLECYSTECTOMY N/A 10/22/2014   Procedure: LAPAROSCOPIC CHOLECYSTECTOMY WITH  INTRAOPERATIVE CHOLANGIOGRAM;  Surgeon: Dia Crawford III, MD;  Location: [facility] ORS;  Service: General;  Laterality: N/A;  . cyst removed      from lip as a child  . LUMBAR LAMINECTOMY/DECOMPRESSION MICRODISCECTOMY Bilateral 12/14/2012   Procedure: Bilateral lumbar three-four, four-five decompressive laminotomy/foraminotomy;  Surgeon: Charlie Pitter, MD;  Location: [place] NEURO ORS;  Service: Neurosurgery;  Laterality: Bilateral;  . PULSE GENERATOR IMPLANT Bilateral 12/13/2013   Procedure: Bilateral implantable pulse generator placement;  Surgeon: Erline Levine, MD;  Location: [place] NEURO ORS;  Service: Neurosurgery;  Laterality: Bilateral;  Bilateral implantable pulse generator placement  . skin cancer removed     from ears,   12 lft arm  rt leg 15  . SUBTHALAMIC STIMULATOR INSERTION Bilateral 12/06/2013   Procedure: SUBTHALAMIC STIMULATOR INSERTION;  Surgeon: Erline Levine, MD;  Location: [place] NEURO ORS;  Service: Neurosurgery;  Laterality: Bilateral;  Bilateral deep brain stimulator placement     Home Meds: Prior to Admission medications   Medication Sig Start Date End Date Taking? Authorizing Provider  Artificial Tear Ointment (DRY EYES OP) Apply to eye.   Yes Historical Provider, MD  buPROPion (WELLBUTRIN XL) 150 MG 24 hr tablet TAKE 3 TABLETS DAILY 07/01/15  Yes Tonia Ghent, MD  carbidopa-levodopa (SINEMET IR) 25-100 MG per tablet Take by mouth. 2 AM/ 1 afternoon/ 1 evening   Yes Historical Provider, MD  cholecalciferol (VITAMIN D) 1000 UNITS tablet Take 1,000  Units by mouth daily.   Yes Historical Provider, MD  escitalopram (LEXAPRO) 10 MG tablet TAKE 1 TABLET DAILY 01/04/16  Yes Tonia Ghent, MD  fluticasone [facility]) 50 MCG/ACT nasal spray Place into both nostrils as needed. 06/21/16  Yes Historical Provider, MD  naproxen (NAPROSYN) 500 MG tablet Take 500 mg by mouth 2 (two) times daily with a meal.   Yes Historical Provider, MD  oxyCODONE-acetaminophen (PERCOCET/ROXICET) 5-325 MG tablet Take 1-2  tablets by mouth every 6 (six) hours as needed for severe pain (sedation caution). 10/26/15  Yes Tonia Ghent, MD  Pimavanserin Tartrate (NUPLAZID) 17 MG TABS Take 2 tablets by mouth daily. 05/31/16  Yes Rebecca S Tat, DO  pravastatin (PRAVACHOL) 20 MG tablet Take 1 tablet (20 mg total) by mouth every evening. 06/06/16  Yes Minna Merritts, MD    Allergies:  Allergies  Allergen Reactions  . Klonopin [Clonazepam] Other (See Comments)    Worsening mood/depression  . Morphine And Related     hallucinations    Social History   Social History  . Marital status: Married    Spouse name: N/A  . Number of children: N/A  . Years of education: N/A   Occupational History  . disabled     2001, [employer]  .      search and rescue helicopter   Social History Main Topics  . Smoking status: Never Smoker  . Smokeless tobacco: Former Systems developer  . Alcohol use 0.0 oz/week     Comment: occasional (twice per month)  . Drug use: No  . Sexual activity: No   Other Topics Concern  . 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    Married 2014     Family History  Problem Relation Age of Onset  . Lung cancer Father   . Colon cancer Neg Hx   . Prostate cancer Neg Hx      ROS:  Please see the history of present illness.     All other systems reviewed and negative.    Physical Exam: Blood pressure (!) 142/80, pulse (!) 38, height 5\' 9"  (1.753 m), weight 184 lb 8 oz (83.7 kg). General: Well developed, well nourished male in no acute distress. Head: Normocephalic, atraumatic, sclera non-icteric, no xanthomas, nares are without discharge. EENT: normal  Lymph Nodes:  none Neck: Negative for carotid bruits. JVD not elevated. Back:without scoliosis kyphosis Lungs: Clear bilaterally to auscultation without wheezes, rales, or rhonchi. Breathing is unlabored. Chest bilateral prepectoral deep brain stimulator  generator's Heart: RRR with S1 S2. No  murmur . No rubs, or gallops appreciated. Abdomen: Soft, non-tender, non-distended with normoactive bowel sounds. No hepatomegaly. No rebound/guarding. No obvious abdominal masses. Msk:  Strength and tone appear normal for age. Extremities: No clubbing or cyanosis. No + edema.  Distal pedal pulses are 2+ and equal bilaterally. Skin: Warm and Dry Neuro: Alert and oriented X 3. CN III-XII intact Grossly normal sensory and motor function . Psych:  Responds to questions appropriately with a normal affect.      Labs: Cardiac Enzymes No results for input(s): CKTOTAL, CKMB, TROPONINI in the last 72 hours. CBC Lab Results  Component Value Date   WBC 7.5 06/04/2016   HGB 14.5 06/04/2016   HCT 42.8 06/04/2016   MCV 90.6 06/04/2016   PLT 192 06/04/2016   PROTIME: No results for input(s): LABPROT,  INR in the last 72 hours. Chemistry No results for input(s): NA, K, CL, CO2, BUN, CREATININE, CALCIUM, PROT, BILITOT, ALKPHOS, ALT, AST, GLUCOSE in the last 168 hours.  Invalid input(s): LABALBU Lipids Lab Results  Component Value Date   CHOL 150 07/17/2015   HDL 33.10 (L) 07/17/2015   LDLCALC 90 07/17/2015   TRIG 136.0 07/17/2015   BNP No results found for: PROBNP Thyroid Function Tests: No results for input(s): TSH, T4TOTAL, T3FREE, THYROIDAB in the last 72 hours.  Invalid input(s): FREET3 Miscellaneous No results found for: DDIMER  Radiology/Studies:  No results found.  EKG: Sinus rhythm at 38 Intervals 28/10/___ Deep brain stimulator   artifact noted at high frequency   Assessment and Plan:  Sinus bradycardia-chronic  Parkinson's disease  Fatigue  Bilateral deep brain stimulator  He has chronic bradycardia and while there has been some slowing into the high 30s, this is not altogether different from the 40 that was recorded in 2014 when there were no associated symptoms. His, I think it is unlikely that addressing his bradycardia  is going to have a big impact on his fatigue  The patient has bilateral deep brain stimulators pectoral areas. There is artifact on his surface electrocardiogram. This raises the issue as to whether pacing would be doable regardless. I will check with [employer] regarding this in the event that his symptoms and/or bradycardia worsen and are more clearly correlated    Virl Axe

## 2016-07-19 ENCOUNTER — Encounter: Payer: Medicare PPO | Admitting: Occupational Therapy

## 2016-07-19 ENCOUNTER — Ambulatory Visit: Payer: Medicare PPO | Admitting: Physical Therapy

## 2016-07-21 ENCOUNTER — Ambulatory Visit (INDEPENDENT_AMBULATORY_CARE_PROVIDER_SITE_OTHER): Payer: Medicare PPO | Admitting: Psychology

## 2016-07-21 DIAGNOSIS — F4321 Adjustment disorder with depressed mood: Secondary | ICD-10-CM | POA: Diagnosis not present

## 2016-07-25 ENCOUNTER — Ambulatory Visit (INDEPENDENT_AMBULATORY_CARE_PROVIDER_SITE_OTHER): Payer: Medicare PPO

## 2016-07-25 ENCOUNTER — Other Ambulatory Visit: Payer: Self-pay | Admitting: Family Medicine

## 2016-07-25 VITALS — BP 124/78 | HR 48 | Temp 97.5°F | Ht 67.5 in | Wt 183.5 lb

## 2016-07-25 DIAGNOSIS — M10079 Idiopathic gout, unspecified ankle and foot: Secondary | ICD-10-CM

## 2016-07-25 DIAGNOSIS — E782 Mixed hyperlipidemia: Secondary | ICD-10-CM | POA: Diagnosis not present

## 2016-07-25 DIAGNOSIS — Z0000 Encounter for general adult medical examination without abnormal findings: Secondary | ICD-10-CM | POA: Diagnosis not present

## 2016-07-25 LAB — LIPID PANEL
CHOLESTEROL: 151 mg/dL (ref 0–200)
HDL: 36.2 mg/dL — AB (ref >39.00)
LDL CALC: 92 mg/dL (ref 0–99)
NonHDL: 115.19
TRIGLYCERIDES: 117 mg/dL (ref 0.0–149.0)
Total CHOL/HDL Ratio: 4
VLDL: 23.4 mg/dL (ref 0.0–40.0)

## 2016-07-25 LAB — COMPREHENSIVE METABOLIC PANEL
ALBUMIN: 4.4 g/dL (ref 3.5–5.2)
ALT: 5 U/L (ref 0–53)
AST: 14 U/L (ref 0–37)
Alkaline Phosphatase: 68 U/L (ref 39–117)
BUN: 21 mg/dL (ref 6–23)
CHLORIDE: 107 meq/L (ref 96–112)
CO2: 29 mEq/L (ref 19–32)
Calcium: 9.7 mg/dL (ref 8.4–10.5)
Creatinine, Ser: 1.02 mg/dL (ref 0.40–1.50)
GFR: 77.39 mL/min (ref >60.00)
Glucose, Bld: 102 mg/dL — ABNORMAL HIGH (ref 70–99)
POTASSIUM: 4.4 meq/L (ref 3.5–5.1)
SODIUM: 141 meq/L (ref 135–145)
TOTAL PROTEIN: 7.3 g/dL (ref 6.0–8.3)
Total Bilirubin: 0.6 mg/dL (ref 0.2–1.2)

## 2016-07-25 LAB — URIC ACID: Uric Acid, Serum: 6.6 mg/dL (ref 4.0–7.8)

## 2016-07-25 NOTE — Progress Notes (Signed)
Subjective:   [name] is a 66 y.o. male who presents for Medicare Annual/Subsequent preventive examination.  Review of Systems:  N/A Cardiac Risk Factors include: advanced age (>3men, >67 women);hypertension;dyslipidemia;male gender     Objective:    Vitals: BP 124/78 (BP Location: Right Arm, Patient Position: Sitting, Cuff Size: Normal)   Pulse (!) 48   Temp 97.5 F (36.4 C) (Oral)   Ht 5' 7.5" (1.715 m) Comment: no shoes  Wt 183 lb 8 oz (83.2 kg)   SpO2 96%   BMI 28.32 kg/m   Body mass index is 28.32 kg/m.  Tobacco History  Smoking Status  . Never Smoker  Smokeless Tobacco  . Former Engineer, structural given: No   Past Medical History:  Diagnosis Date  . Arthritis   . Bradycardia   . Cancer [facility]) 2013   skin cancer  . Depression    ptsd  . Dysrhythmia    chronic slow heart rate  . GERD (gastroesophageal reflux disease)   . Headache(784.0)    tension headaches non recent  . History of chicken pox   . History of kidney stones   . Hypertension    treated with HCTZ  . Parkinson's disease ([place])    dx'ed 15 years ago  . PTSD (post-traumatic stress disorder)   . Shortness of breath dyspnea   . Sleep apnea    doesn't use C-pap  . Varicose veins    Past Surgical History:  Procedure Laterality Date  . CHOLECYSTECTOMY N/A 10/22/2014   Procedure: LAPAROSCOPIC CHOLECYSTECTOMY WITH INTRAOPERATIVE CHOLANGIOGRAM;  Surgeon: Dia Crawford III, MD;  Location: [facility] ORS;  Service: General;  Laterality: N/A;  . cyst removed      from lip as a child  . LUMBAR LAMINECTOMY/DECOMPRESSION MICRODISCECTOMY Bilateral 12/14/2012   Procedure: Bilateral lumbar three-four, four-five decompressive laminotomy/foraminotomy;  Surgeon: Charlie Pitter, MD;  Location: [place] [facility]UR[facility] ORS;  Service: Neurosurgery;  Laterality: Bilateral;  . PULSE GENERATOR IMPLANT Bilateral 12/13/2013   Procedure: Bilateral implantable pulse generator placement;  Surgeon: Erline Levine, MD;  Location: [place] [facility]UR[facility] ORS;   Service: Neurosurgery;  Laterality: Bilateral;  Bilateral implantable pulse generator placement  . skin cancer removed     from ears,   12 lft arm  rt leg 15  . SUBTHALAMIC STIMULATOR INSERTION Bilateral 12/06/2013   Procedure: SUBTHALAMIC STIMULATOR INSERTION;  Surgeon: Erline Levine, MD;  Location: [place] [facility]UR[facility] ORS;  Service: Neurosurgery;  Laterality: Bilateral;  Bilateral deep brain stimulator placement   Family History  Problem Relation Age of Onset  . Lung cancer Father   . Colon cancer Neg Hx   . Prostate cancer Neg Hx    History  Sexual Activity  . Sexual activity: No    Outpatient Encounter Prescriptions as of 07/25/2016  Medication Sig  . Artificial Tear Ointment (DRY EYES OP) Apply to eye as needed.   Marland Kitchen buPROPion (WELLBUTRIN XL) 150 MG 24 hr tablet TAKE 3 TABLETS DAILY  . carbidopa-levodopa (SINEMET IR) 25-100 MG per tablet Take by mouth. 2 AM/ 1 afternoon/ 1 evening  . cholecalciferol (VITAMIN D) 1000 UNITS tablet Take 1,000 Units by mouth daily.  Marland Kitchen escitalopram (LEXAPRO) 10 MG tablet TAKE 1 TABLET DAILY  . fluticasone (FLONASE) 50 MCG/ACT nasal spray Place into both nostrils as needed.  . naproxen (NAPROSYN) 500 MG tablet Take 500 mg by mouth 2 (two) times daily with a meal.  . pravastatin (PRAVACHOL) 20 MG tablet Take 1 tablet (20 mg total) by  mouth every evening.  . [DISCONTINUED] Pimavanserin Tartrate (NUPLAZID) 17 MG TABS Take 2 tablets by mouth daily.  Marland Kitchen oxyCODONE-acetaminophen (PERCOCET/ROXICET) 5-325 MG tablet Take 1-2 tablets by mouth every 6 (six) hours as needed for severe pain (sedation caution). (Patient not taking: Reported on 07/25/2016)   No facility-administered encounter medications on file as of 07/25/2016.     Activities of Daily Living In your present state of health, do you have any difficulty performing the following activities: 07/25/2016  Hearing? N  Vision? Y  Difficulty concentrating or making decisions? Y  Walking or climbing stairs? Y  Dressing or  bathing? Y  Doing errands, shopping? Y  Preparing Food and eating ? Y  Using the Toilet? N  In the past six months, have you accidently leaked urine? N  Do you have problems with loss of bowel control? N  Managing your Medications? Y  Managing your Finances? Y  Housekeeping or managing your Housekeeping? Y  Some recent data might be hidden    Patient Care Team: Tonia Ghent, MD as PCP - General (Family Medicine) Barrie Folk, LCSW as Social Worker (Licensed Clinical Social Worker) Laverle Hobby, MD as Consulting Physician (Pulmonary Disease) Ludwig Clarks, DO as Consulting Physician (Neurology) Minna Merritts, MD as Consulting Physician (Cardiology) Earnie Larsson, MD as Consulting Physician (Neurosurgery) Jannet Mantis, MD as Consulting Physician (Dermatology)   Assessment:     Hearing Screening   125Hz  250Hz  500Hz  1000Hz  2000Hz  3000Hz  4000Hz  6000Hz  8000Hz   Right ear:   40 40 40  40    Left ear:   40 40 40  40    Vision Screening Comments: Last vision exam in 2017 with Dr. Araceli Bouche   Exercise Activities and Dietary recommendations Current Exercise Habits: The patient does not participate in regular exercise at present, Exercise limited by: neurologic condition(s);orthopedic condition(s)  Goals    . physical          When weather permits, I will attempt to walk at least 10 min every other day.      Fall Risk Fall Risk  07/25/2016 05/31/2016 02/11/2016 07/21/2015 07/21/2015  Falls in the past year? Yes Yes Yes Yes Yes  Number falls in past yr: 2 or more 2 or more 2 or more 2 or more 2 or more  Injury with Fall? Yes No Yes Yes Yes  Risk Factor Category  - High Fall Risk High Fall Risk High Fall Risk High Fall Risk  Risk for fall due to : History of fall(s);Impaired balance/gait;Impaired mobility;Impaired vision;Other (Comment) - - History of fall(s);Impaired balance/gait;Impaired mobility History of fall(s);Impaired balance/gait;Impaired mobility  Risk for fall due  to (comments): Parkinson's disease - - - -  Follow up - Falls evaluation completed Falls evaluation completed Falls evaluation completed;Education provided;Falls prevention discussed Falls evaluation completed;Education provided;Falls prevention discussed   Depression Screen PHQ 2/9 Scores 07/25/2016 07/21/2015 07/21/2015  PHQ - 2 Score 5 3 3   PHQ- 9 Score 15 10 10     Cognitive Function MMSE - Mini Mental State Exam 07/25/2016 07/21/2015  Orientation to time 5 5  Orientation to Place 5 5  Registration 3 3  Attention/ Calculation 0 0  Recall 3 3  Language- name 2 objects 0 0  Language- repeat 1 1  Language- follow 3 step command 3 3  Language- read & follow direction 0 0  Write a sentence 0 0  Copy design 0 0  Total score 20 20     PLEASE NOTE: A  Mini-Cog screen was completed. Maximum score is 20. A value of 0 denotes this part of Folstein MMSE was not completed or the patient failed this part of the Mini-Cog screening.   Mini-Cog Screening Orientation to Time - Max 5 pts Orientation to Place - Max 5 pts Registration - Max 3 pts Recall - Max 3 pts Language Repeat - Max 1 pts Language Follow 3 Step Command - Max 3 pts     Immunization History  Administered Date(s) Administered  . Influenza,inj,Quad PF,36+ Mos 01/17/2014, 01/05/2015, 03/23/2016  . Pneumococcal Conjugate-13 01/05/2015   Screening Tests Health Maintenance  Topic Date Due  . PNA vac Low Risk Adult (2 of 2 - PPSV23) 04/17/2017 (Originally 01/05/2016)  . TETANUS/TDAP  07/26/2026 (Originally 06/04/1969)  . COLON CANCER SCREENING ANNUAL FOBT  09/28/2016  . INFLUENZA VACCINE  11/16/2016  . Hepatitis C Screening  Completed      Plan:     I have personally reviewed and addressed the Medicare Annual Wellness questionnaire and have noted the following in the patient's chart:  A. Medical and social history B. Use of alcohol, tobacco or illicit drugs  C. Current medications and supplements D. Functional ability and  status E.  Nutritional status F.  Physical activity G. Advance directives H. List of other physicians I.  Hospitalizations, surgeries, and ER visits in previous 12 months J.  [place] to include hearing, vision, cognitive, depression L. Referrals and appointments - none  In addition, I have reviewed and discussed with patient certain preventive protocols, quality metrics, and best practice recommendations. A written personalized care plan for preventive services as well as general preventive health recommendations were provided to patient.  See attached scanned questionnaire for additional information.   Signed,   Lindell Noe, MHA, BS, LPN Health Coach

## 2016-07-25 NOTE — Patient Instructions (Signed)
[name] , Thank you for taking time to come for your Medicare Wellness Visit. I appreciate your ongoing commitment to your health goals. Please review the following plan we discussed and let me know if I can assist you in the future.   These are the goals we discussed: Goals    . physical          When weather permits, I will attempt to walk at least 10 min every other day.       This is a list of the screening recommended for you and due dates:  Health Maintenance  Topic Date Due  . Pneumonia vaccines (2 of 2 - PPSV23) 04/17/2017*  . Tetanus Vaccine  07/26/2026*  . Stool Blood Test  09/28/2016  . Flu Shot  11/16/2016  .  Hepatitis C: One time screening is recommended by Center for Disease Control  (CDC) for  adults born from [birth year] through 1966.   Completed  *Topic was postponed. The date shown is not the original due date.   Preventive Care for Adults  A healthy lifestyle and preventive care can promote health and wellness. Preventive health guidelines for adults include the following key practices.  . A routine yearly physical is a good way to check with your health care provider about your health and preventive screening. It is a chance to share any concerns and updates on your health and to receive a thorough exam.  . Visit your dentist for a routine exam and preventive care every 6 months. Brush your teeth twice a day and floss once a day. Good oral hygiene prevents tooth decay and gum disease.  . The frequency of eye exams is based on your age, health, family medical history, use  of contact lenses, and other factors. Follow your health care provider's ecommendations for frequency of eye exams.  . Eat a healthy diet. Foods like vegetables, fruits, whole grains, low-fat dairy products, and lean protein foods contain the nutrients you need without too many calories. Decrease your intake of foods high in solid fats, added sugars, and salt. Eat the right amount of calories for  you. Get information about a proper diet from your health care provider, if necessary.  . Regular physical exercise is one of the most important things you can do for your health. Most adults should get at least 150 minutes of moderate-intensity exercise (any activity that increases your heart rate and causes you to sweat) each week. In addition, most adults need muscle-strengthening exercises on 2 or more days a week.  Silver Sneakers may be a benefit available to you. To determine eligibility, you may visit the website: www.silversneakers.com or contact program at [phone]) 490-2623 Mon-Fri between 8AM-8PM.   . Maintain a healthy weight. The body mass index (BMI) is a screening tool to identify possible weight problems. It provides an estimate of body fat based on height and weight. Your health care provider can find your BMI and can help you achieve or maintain a healthy weight.   For adults 20 years and older: ? A BMI below 18.5 is considered underweight. ? A BMI of 18.5 to 24.9 is normal. ? A BMI of 25 to 29.9 is considered overweight. ? A BMI of 30 and above is considered obese.   . Maintain normal blood lipids and cholesterol levels by exercising and minimizing your intake of saturated fat. Eat a balanced diet with plenty of fruit and vegetables. Blood tests for lipids and cholesterol should begin at age 23  and be repeated every 5 years. If your lipid or cholesterol levels are high, you are over 50, or you are at high risk for heart disease, you may need your cholesterol levels checked more frequently. Ongoing high lipid and cholesterol levels should be treated with medicines if diet and exercise are not working.  . If you smoke, find out from your health care provider how to quit. If you do not use tobacco, please do not start.  . If you choose to drink alcohol, please do not consume more than 2 drinks per day. One drink is considered to be 12 ounces (355 mL) of beer, 5 ounces (148 mL) of  wine, or 1.5 ounces (44 mL) of liquor.  . If you are 4-59 years old, ask your health care provider if you should take aspirin to prevent strokes.  . Use sunscreen. Apply sunscreen liberally and repeatedly throughout the day. You should seek shade when your shadow is shorter than you. Protect yourself by wearing long sleeves, pants, a wide-brimmed hat, and sunglasses year round, whenever you are outdoors.  . Once a month, do a whole body skin exam, using a mirror to look at the skin on your back. Tell your health care provider of new moles, moles that have irregular borders, moles that are larger than a pencil eraser, or moles that have changed in shape or color.

## 2016-07-25 NOTE — Progress Notes (Signed)
PCP notes:   Health maintenance:  PPSV23 - pt will discuss with PCP at next appt Tetanus - per pt, vaccine may have been taken at [facility]  Abnormal screenings:   Fall risk - pt reports more than 30 falls in the past year. Pt reports a broken finger and issues with left wrist.   Depression score: 15; pt is under care of psychiatrist per spouse  Patient concerns:   None  Nurse concerns:  None  Next PCP appt:   07/29/16 @ 1215  I reviewed health advisor's note, was available for consultation on the day of service listed in this note, and agree with documentation and plan. Elsie Stain, MD.

## 2016-07-25 NOTE — Progress Notes (Signed)
Pre visit review using our clinic review tool, if applicable. No additional management support is needed unless otherwise documented below in the visit note. 

## 2016-07-29 ENCOUNTER — Ambulatory Visit (INDEPENDENT_AMBULATORY_CARE_PROVIDER_SITE_OTHER): Payer: Medicare PPO | Admitting: Family Medicine

## 2016-07-29 ENCOUNTER — Encounter: Payer: Self-pay | Admitting: Family Medicine

## 2016-07-29 VITALS — BP 126/64 | HR 44 | Temp 98.8°F | Ht 68.0 in | Wt 184.0 lb

## 2016-07-29 DIAGNOSIS — R001 Bradycardia, unspecified: Secondary | ICD-10-CM | POA: Diagnosis not present

## 2016-07-29 DIAGNOSIS — Z7189 Other specified counseling: Secondary | ICD-10-CM

## 2016-07-29 DIAGNOSIS — E782 Mixed hyperlipidemia: Secondary | ICD-10-CM | POA: Diagnosis not present

## 2016-07-29 DIAGNOSIS — F339 Major depressive disorder, recurrent, unspecified: Secondary | ICD-10-CM

## 2016-07-29 DIAGNOSIS — Z1211 Encounter for screening for malignant neoplasm of colon: Secondary | ICD-10-CM

## 2016-07-29 DIAGNOSIS — Z0000 Encounter for general adult medical examination without abnormal findings: Secondary | ICD-10-CM

## 2016-07-29 DIAGNOSIS — K219 Gastro-esophageal reflux disease without esophagitis: Secondary | ICD-10-CM

## 2016-07-29 MED ORDER — OMEPRAZOLE 20 MG PO CPDR: 20.0000 mg | DELAYED_RELEASE_CAPSULE | Freq: Every day | ORAL | 1 refills | Status: DC

## 2016-07-29 MED ORDER — ESCITALOPRAM OXALATE 10 MG PO TABS: 10.0000 mg | ORAL_TABLET | Freq: Every day | ORAL | 3 refills | Status: DC

## 2016-07-29 MED ORDER — BUPROPION HCL ER (XL) 150 MG PO TB24: 450.0000 mg | ORAL_TABLET | Freq: Every day | ORAL | 3 refills | Status: DC

## 2016-07-29 NOTE — Progress Notes (Signed)
PPSV23 - pt will discuss with PCP at next appt Tetanus last done ~2002, d/w pt.  See AVS.  See AVS re: shingles.  D/w patient [id number] for colon cancer screening, including IFOB vs. colonoscopy.  Risks and benefits of both were discussed and patient voiced understanding.  Pt elects NBV:APOL.    Advance directive- wife designated if patient were incapacitated.    Prostate cancer screening and PSA options (with potential risks and benefits of testing vs not testing) were discussed along with recent recs/guidelines.  He opted to consider testing PSA at this point, but not draw PSA now.   Fall risk - pt reports more than 30 falls in the past year. Pt reports a broken finger and issues with left wrist.  Now with new house which is more gait friendly and may lower his fall risk.  Has a ramp now.  Fewer falls in the meantime, he can get around the house better compared to the old residence.    Applying for VA disability due to parkinson's with prev jet fuel exposure.    Depression score: 15; pt is under care of psych and mood is "up and down" but compared to prev is improved.  He has had f/u with psych pending.  Still with night terrors, not every night but still occurring, about the same as prev.  He is putting up with the episodes.    Bradycardia.  Has had cards f/u. My understanding is that there is no plan for intervention per cards now.     Elevated Cholesterol: Using medications without problems:no Muscle aches: no Diet compliance: yes Exercise: as tolerated.   Rare nsaid use but still with GERD sx.  Random occurrence.  Tried OTC prilosec 20mg  occ but not every day.  Okay to try daily.  D/w pt.    PMH and SH reviewed  ROS: Per HPI unless specifically indicated in ROS section   Meds, vitals, and allergies reviewed.   GEN: nad, alert and oriented, gait and speech at baseline. HEENT: mucous membranes moist NECK: supple w/o LA CV: rrr. PULM: ctab, no inc wob ABD: soft, +bs EXT: no  edema SKIN: no acute rash

## 2016-07-29 NOTE — Patient Instructions (Addendum)
Go to the lab on the way out.  We'll contact you with your lab report. Check with your insurance to see if they will cover the shingles and tetanus shots.  Okay with me to get the shingles shot as long as Dr. Carles Collet agrees.   Take care.  Glad to see you.

## 2016-07-29 NOTE — Progress Notes (Signed)
Pre visit review using our clinic review tool, if applicable. No additional management support is needed unless otherwise documented below in the visit note. 

## 2016-07-31 DIAGNOSIS — K219 Gastro-esophageal reflux disease without esophagitis: Secondary | ICD-10-CM | POA: Insufficient documentation

## 2016-07-31 NOTE — Assessment & Plan Note (Signed)
My understanding is that there is no plan for intervention at this point. Continue to observe.

## 2016-07-31 NOTE — Assessment & Plan Note (Addendum)
Able to tolerate statin. Continue as is. >25 minutes spent in face to face time with patient, >50% spent in counselling or coordination of care. Marland Kitchen

## 2016-07-31 NOTE — Assessment & Plan Note (Addendum)
He will follow-up with psychology. Continue current medications. He agrees with plan. At this point okay for outpatient follow-up.

## 2016-07-31 NOTE — Assessment & Plan Note (Signed)
Advance directive- wife designated if patient were incapacitated.  

## 2016-07-31 NOTE — Assessment & Plan Note (Addendum)
PPSV23 - pt will discuss with PCP at next appt Tetanus last done ~2002, d/w pt.  See AVS.  See AVS re: shingles.  D/w patient [id number] for colon cancer screening, including IFOB vs. colonoscopy.  Risks and benefits of both were discussed and patient voiced understanding.  Pt elects [id number]:[id number].    Prostate cancer screening and PSA options (with potential risks and benefits of testing vs not testing) were discussed along with recent recs/guidelines.  He opted to consider testing PSA at this point, but not draw PSA now.   Fall risk - pt reports more than 30 falls in the past year. Pt reports a broken finger and issues with left wrist.  Now with new house which is more gait friendly and may lower his fall risk.  Has a ramp now.  Fewer falls in the meantime, he can get around the house better compared to the old residence.    Applying for VA disability due to parkinson's with prev jet fuel exposure.

## 2016-07-31 NOTE — Assessment & Plan Note (Signed)
Okay to try PPI daily to see if this makes a difference. Update me as needed. He agrees.

## 2016-08-02 DIAGNOSIS — D3132 Benign neoplasm of left choroid: Secondary | ICD-10-CM | POA: Diagnosis not present

## 2016-08-02 DIAGNOSIS — H2513 Age-related nuclear cataract, bilateral: Secondary | ICD-10-CM | POA: Diagnosis not present

## 2016-08-10 ENCOUNTER — Encounter: Payer: Self-pay | Admitting: Neurology

## 2016-08-11 ENCOUNTER — Encounter: Payer: Self-pay | Admitting: Psychology

## 2016-08-11 NOTE — Telephone Encounter (Signed)
Please schedule patient for neurocognitive testing.

## 2016-08-15 NOTE — Progress Notes (Signed)
[name] was seen today in the movement disorders clinic for neurologic consultation at the request of Dr. Annette Stable. His PCP is at the [facility] in [place].  The consultation is for the evaluation of Parkinson's disease.  The patient is accompanied by his wife who supplements the history.  The patient previously saw Dr. Jennelle Human at [facility] and I do have several of those records.  Patient was diagnosed with Parkinson's disease about 15 years ago, at the age of 67-61 years old.  He states that the first sx was not swinging of the R arm and some minor balance changes.  Balance has gotten worse with time.   The patient is currently on carbidopa/levodopa 25/100, 3 at 9am, 2 at 1, 3 at 5pm, 2 at 9 pm and 1 carbidopa/levodopa 25/100 CR at bedtime.  He is also on Mirapex 0.5 mg 3 times a day and selegiline 5 mg daily.  Because of dyskinesia, Dr. Nicki Reaper did talk to him about DBS, but according to Dr. Bary Leriche notes, the patient was skeptical.  He did have a neuropsych evaluation at the [facility] in [place], but it appears that this was more of counseling and no testing.  He did have a nocturnal polysomnogram on 06/20/2010 demonstrating an apnea hypopnea index of 16.  07/11/13 update:  Pt is f/u today re: PD.  This patient is accompanied in the office by his spouse who supplements the history.  He is on carbidopa/levodopa, 3 at 9am, 2 at 1, 3 at 5pm, 2 at 9 pm and takes carbidopa/levodopa CR 25/100  at bedtime.  This is a total daily levodopa dose of 1100 mg per day.  He is on mirapex 0.5 mg three times per day and selegeline 5 mg in the AM.  He states, "I am ready for DBS.  Tell me about what I need to do."  Pt c/o dyskinesias.  Falls on average once per week.  No hallucinations.  No n/v.  No syncope.  States that he saw himself singing at [place] and could not believe how much dyskinesia he had.  He states that when his medications don't work, he "hits a wall."  This is sometimes very unpredictable.  07/24/13:  Present with  wife for on/off test.  Actually went to PT late yesterday afternoon and was off of medication and had a very hard time.  While he has been off medication for 36 hours, his wife reports that she thinks that he has done remarkably well.  There has been minimal tremor and he really hasn't been dragging his leg much.  He expresses desire to proceed with DBS.  10/25/13 update:  Patient presents today with his wife, who supplements the history.  The patient is preop DBS.  He is scheduled for deep brain stimulation surgery on 12/06/2013.  He is scheduled for IPG placement on 12/13/2013.  Since our last visit, he did have neuropsychometric testing.  Results of this were favorable for DBS placement.  It was recommended that he faithfully wear his CPAP.  He just had his preop MRI of the brain done.  Patient is currently taking levodopa 25/100, on the following schedule: 3/2/3/2 and then an additional carbidopa/levodopa 25/100 CR at bedtime.  This is in addition to pramipexole 0.5 mg 3 times a day and selegiline 5 mg in the morning.  Overall doing well.  No hallucinations.  No falls.  Has had some more of his chronic back pain.  01/08/14 update:  The patient  presents today for followup.  He is accompanied by his wife who supplements the history.  The patient underwent bilateral STN DBS on 12/06/2013 with generator placement on 12/13/2013.  The patient's wife reports that his Parkinson's disease symptoms have actually been much better since surgery.  He did go back to taking his previous dose of medication, however.  He is on carbidopa/levodopa 25/100 on the following schedule: 3/2/3/2 and then he takes an additional carbidopa/levodopa 25/100 CR at night.  He is also on Mirapex 0.5 mg 3 times a day as well as selegiline 5 mg in the morning.  He had 2 falls since last visit.  On the one, he actually fell down a few stairs and states that he was actually able to roll onto the carpet.  He did not get hurt.  With the other one,  he states that he was walking on a flat surface and just did not expect there to be a step downward and there was.  Again, he did not get hurt.  Wife reports minor dyskinesia.  They have noted no tremor.  Rigidity has been better.  He has been very sleepy.  Wife thinks that this is because he only sleeps about 4 hours at night because he is trying to do so much during the day.  01/16/14 update:  Patient returns today, accompanied by his wife who supplements the history.  Overall, the patient has been doing well.  He remains very sleepy.  He is not sure if that is from medication.  He refuses to wear the CPAP.  He is supposed to be on a tablet of carbidopa/levodopa 25/100 per day but has dropped down to 6 on his own.  He is on pramipexole 0.5 mg twice per day.  He does not want to start speech therapy until November, because he is going out of town.  He had one fall on the stairs since last visit.  Reports that he has an old house with small steps.  He denies any dyskinesia.  He has had some swelling in the hands, right greater than left.  He has an appointment to discuss this with his primary care physician.  He denies any tremor.    01/22/14 update:  Patient returns today for followup, accompanied by his wife who supplements the history.  Patient called me almost immediately after last visit complaining about dyskinesia and he wanted me to turn his settings back down.  I had told him to instead to decrease his levodopa up to 2 tablets in the morning, one in the afternoon and one in the evening and we had already been weaning his Mirapex.  That stopped the dyskinesias which were in the R arm and he admits that some days he is only taking 3 levodopa per day.  He is sometimes having what sounds like myoclonic jerking in the R arm.  He also c/o "aching" in the joints all over the body "but it doesn't feel like the stiffness of parkinsons disease."  He still has swelling.  He did f/u with PCP but etiology is unknown.     02/17/14 update:  This patient is accompanied in the office by his spouse who supplements the history.  Has had several falls.  Almost fell into the fireplace.  [place] backwards going down the stairs.  No serious injuries but still multiple falls.  Generally occur on the stairs.  Wrecked his [place] as missed the brake.  No tremor but did turn off his device  and notes that tremor will immediately return.  Off the mirapex but still sleeping "all the time."  Wife states that no longer doing the things that he is to work to do.  He is less involved with the church, which he used to be very passionate about.  He is not exercising like he was.  He started speech therapy today.  He had a  Modified barium swallow on 02/05/2014 that demonstrated moderate pharyngeal phase dysphagia and recommended a dysphagia 3 diet (mechanical soft) with nectar thick liquids.  They just got the nectar thick-it today.  His wife thinks that perhaps he is depressed.  She thinks that maybe that is why he is sleeping all the time.the patient also asked me about a referral to orthopedics for a rotator cuff problem on the left.  He is having severe pain, but does not necessarily want surgery.  04/15/14 update:  The patient returns today for follow-up, accompanied by his wife who supplements the history.  He remains on carbidopa/levodopa 25/100, 1 tablet 4 times per day.  He was started on Wellbutrin last visit.  This has been worked up to 300 mg daily.  His mood is much better.  His motivation is much better.  He is sleeping much less.  His balance is not very good.  He has not yet started physical therapy, but plans to next week.  He has finished LSVT loud.  He notices some tremor of the left hand and tremor of the right face.  We did have him try to turn off the DBS to see what would happen with the tremor right face and he got much worse.  He has seen Dr. Maureen Ralphs and had cortisone injections into the right shoulder.  That has helped.  He had an  EMG that demonstrated chronic multilevel radiculopathy from L2-S1 bilaterally.  There was no evidence of peripheral neuropathy.  05/08/13 update:  This patient is accompanied in the office by his spouse who supplements the history.  Overall, the patient is doing well but he is contemplating back surgery.  He saw Dr. Trenton Gammon regarding this and is supposed to have a CT myelogram soon.  He presents today because he is having some tremor of the right face and arm and left arm tremor, especially when he is stressed.  He takes 2 levodopa in the morning, one in the afternoon and one in the evening.  He really does fairly well with that.  Tremor is worse in the evening.  No falls.  He is doing fairly well with Wellbutrin for his mood.  He has put physical therapy on hold.  07/03/14 update:  Patient is accompanied by his wife, who supplements the history.  He had a back surgery on 06/13/2014 by Dr. Trenton Gammon.  Records are reviewed from the surgery.  He had an L1-L2 laminectomy and reexplored the L4-L5 laminectomies with foraminotomies.  The patient was released from rehabilitation on March 11 and subsequently came to our Parkinson's educational event.  At that event, we talked about the fact that DBS can worsen speech and he thinks that perhaps the device has caused some speech difficulties.  His wife also thinks that his mood has gotten worse, especially since his most recent back surgery.  He is doing better in terms of the back pain.  He is weaning off of gabapentin.  Physical therapy is coming into his house.  He has noticed some left hand and right leg tremor.  07/28/14 update:  Pt is  accompanied by his wife, who supplements the history.  He is continuing to have EDS, which he has had long prior to DBS therapy.  He has OSAS but has not been able to tolerate CPAP therapy.  He has no cardiac disease.  We made significant changes to his programing last visit because of stuttering/speech issues and his wife states this is much  better.  He is going to resume ST because of choking issues and that is to restart tomorrow.  He is having some tremor in the L hand and right shoulder.  When asked about mood, his wife states that "he needs an attitude adjustment some days."  She does state that his driving seems much better after his gabapentin was discontinued.  He is not having any lightheadedness.  No hallucinations.  No confusion.  11/28/14 update:  The patient is following up today, accompanied by his wife who supplements the history.  He is on carbidopa/levodopa 25/100, 2 tablets in the morning, one in the afternoon and one in the evening but admits that he really doesn't take it this way and is lucky to get in 3 per day (hadn't taken any when I saw him today at 11:15 pm).  Last visit, I added Provigil because of excessive daytime hypersomnolence and we ultimately tried to increase it over the phone to twice a day dosing, but by June, the patient had discontinued it because he was doing better from this regard.  He called me on June 6, however complaining about acting out his dreams.  We added clonazepam, 0.5 mg, half a tablet at night.  Only a week later the patient had discontinued as he felt that it perhaps worsened his mood but he also thought that it caused diarrhea.  Not long after that, however, the patient ended up in the emergency room with abdominal pain and was diagnosed with a porcelain gallbladder and had surgery to have his gallbladder removed on 10/21/2014.  His wife states today, however, that the klonopin made him seriously depressed.  He did hallucinate with the morphine in the hospital.  His wife states that they saw someone they hadn't seen in a longtime and they commented how well he looked.  He has started PT several times and had to stop for various reasons.  He attended a support group in [place].    04/01/15 update:  The patient is following up today, accompanied by his wife who supplements the history.  The  patient has Parkinson's disease and is status post deep brain stimulator placement.  He remains on carbidopa/levodopa 25/100, 2 tablets in the morning, one in the afternoon and one in the evening but instead of taking the last dose in the evening he will take it at bedtime and instead of taking the one in the middle of the afternoon he will take that late evening.  Therefore, he will end up having tremor in the right hand late in the day.  Overall, he is doing well and "most days you don't know he has PD" according to his wife.  He has been going to physical therapy at [facility].  His wife states that insurance only covered 3 weeks of therapy and then cut him off and his wife is hoping to re-enroll him after the first of the year.    I have reviewed records since our last visit.  Dr. Damita Dunnings increased his Wellbutrin XL from 300 mg to 450 mg on 01/06/2015.  That helped a lot and he  is more like his old self.   The patient has also seen Dr. Ashby Dawes since our last visit.  He has a history of known sleep apnea and has been noncompliant with his CPAP.  Dr. Ashby Dawes recommended a re-titration study and the patient is awaiting that study.  Vivid dreams continue.    07/24/15 update:  The patient follows up today, accompanied by his wife who supplements the history.  I have also reviewed prior records made available to me.  The patient is status post deep brain stimulation to the bilateral STN in August, 2015.  He remains on carbidopa/levodopa 25/100, 2 tablets three times a day.  His biggest issue has been depression.  He is on Wellbutrin XL, 450 mg daily.  Just 2 days ago his primary care physician added Lexapro.  It was sent to the mail order pharmacy and he has not gotten it yet.  He thought he was supposed to stop his Wellbutrin and asked me about that today.  He is also under the care of a counselor.  He states that he saw her yesterday.  He is not suicidal or homicidal.  His primary care physician also gave him  a prescription for a new walker because he could not use his old walker in his house because of the size.  His wife does state that he has had more difficulty with choking and swallowing.  He really is noncompliant, however, with recommendations on his prior swallowing study.  He eats whatever he wants and prior swallowing study recommended nectar thick liquids.  He does not follow this.  He is not exercising.  10/12/15 update: The patient follows up today, accompanied by his wife who supplements the history.  I have also reviewed prior records made available to me.  The patient is status post deep brain stimulation to the bilateral STN in August, 2015.  He was on carbidopa/levodopa 25/100, 2 tablets 3 times per day but he has dropped it back to 2/1/1 and states that he is doing well with that.  He did fall into a chest of drawers in May and hit his battery on the right.  That made him sore, but no hematoma.  He also fell in his shop and fell into a lampshade.   He has had shoulder pain and is seeing ortho.  Was told that he cannot have MRI so they are doing CT on wed at [facility] imaging. They are planning to move to a one level house and they will only need to put in ramps.  They plan to change the bathroom entrances to widen them and then raise toilet seats.  He just started physical therapy on 10/07/2015.  He did have a modified barium swallow on 08/07/2015.  It demonstrated mild oral pharyngeal dysphagia (an improvement from 2015 examination) and a regular diet with thin liquids was recommended.  He is on Wellbutrin XL, 450 mg daily and right after last visit, started on Lexapro for depression. He is also under the care of a counselor.   Wife states that mood is good except when he falls.  Still having vivid dreams.  He is also having back pain again and plans to have CT.  02/11/16 update:  The patient follows up, accompanied by his wife who supplements the history.  [place]  [place].  He mows the lawn on a riding mower.  The patient is status post deep brain stimulation in  August, 2015.  He is on carbidopa/levodopa 25/100, 2 tablets in the morning, one in the afternoon and one in the evening.  Fell in July and had finger fx. No surgery, just splint.  Reviewed records in that regard.  [place] down stairs in august. New house does have stairs into the house and has fallen going in.  Awaiting a ramp but it is expensive.  He went to PT in august.  Still having LBP and has been trying to get MRI lumbar spine but needs to be done at [facility]. They attempted this but pt refused to have DBS off to have it done.   In regards to depression, pt is on wellbutrin XL 450 mg daily and Lexapro was started since our last after visit and it has helped.    05/30/16 update:  Patient comes in today, accompanied by his wife who supplements the history.  Patient is on carbidopa/levodopa 25/100, 2 tablets in the morning, one in the afternoon and one in the evening.  Unfortunately, patient has had multiple falls since our last visit.  He did not break his hip but it was x-rayed.  He did go through therapy since last visit and I reviewed those records.  He is still going through all 3 modalities.  He is using his U step walker more than in the past but did fall yesterday without it.  Some hallucinations but seem to occur when awakening out of sleep and he will reach out for an animal such as a cat and nothing is there.  08/16/16 update:  Patient seen today, accompanied by his wife who supplements the history.  Patient is on carbidopa/levodopa 25/100, 2 tablets in the morning, one in the afternoon and one in the evening.  I started Nuplazid last visit, but he showed up in the ER about 6 days after I saw him complaining about fatigue.  They told the emergency room that the fatigue was new since starting Nuplazid.  Interestingly, the ER had noted his bradycardia as well, but stated that they thought  that the fatigue was from the Nuplazid.  We had discussed the fatigue prior to starting the Nuplazid as is documented in our previous notes.  He ended up stopping the Nuplazid but wife does state that he was on it for 7 weeks and it didn't help.  He thinks that he still has hallucinations in the AM but not bad.  Has "aura" around objects.  He quickly got an evaluation with Dr. Rockey Situ and then Dr. Caryl Comes.  Dr. Caryl Comes did not think, after patient wore an event monitor, that the bradycardia was likely etiology and he wondered if patient could have PPM anyway with the DBS (he can).  He has had falls but not when using the U-step walker.  States that he usually uses it but "not on short trips."    Wife also c/o memory issues and patient agrees.  Pt states that he has trouble with "short arithmetic."  More trouble remembering to take meds.  Wife prepares pillbox.     Neuroimaging has  previously been performed.  It is not available for my review today.  PREVIOUS MEDICATIONS: Sinemet, Sinemet CR, Mirapex and Eldpryl; klonopin (depressed); nuplazid  ALLERGIES:   Allergies  Allergen Reactions  . Klonopin [Clonazepam] Other (See Comments)    Worsening mood/depression  . Morphine And Related     hallucinations    CURRENT MEDICATIONS:  Allergies as of 08/16/2016      Reactions  Klonopin [clonazepam] Other (See Comments)   Worsening mood/depression   Morphine And Related    hallucinations      Medication List       Accurate as of 08/16/16  3:08 PM. Always use your most recent med list.          buPROPion 150 MG 24 hr tablet Commonly known as:  WELLBUTRIN XL Take 3 tablets (450 mg total) by mouth daily.   carbidopa-levodopa 25-100 MG tablet Commonly known as:  SINEMET IR Take by mouth. 2 AM/ 1 afternoon/ 1 evening   cholecalciferol 1000 units tablet Commonly known as:  VITAMIN D Take 1,000 Units by mouth daily.   DRY EYES OP Apply to eye as needed.   escitalopram 10 MG tablet Commonly  known as:  LEXAPRO Take 1 tablet (10 mg total) by mouth daily.   naproxen 500 MG tablet Commonly known as:  NAPROSYN Take 500 mg by mouth 2 (two) times daily with a meal.   omeprazole 20 MG capsule Commonly known as:  PRILOSEC Take 1 capsule (20 mg total) by mouth daily.   pravastatin 20 MG tablet Commonly known as:  PRAVACHOL Take 1 tablet (20 mg total) by mouth every evening.        PAST MEDICAL HISTORY:   Past Medical History:  Diagnosis Date  . Arthritis   . Bradycardia   . Cancer [facility]) 2013   skin cancer  . Depression    ptsd  . Dysrhythmia    chronic slow heart rate  . GERD (gastroesophageal reflux disease)   . Headache(784.0)    tension headaches non recent  . History of chicken pox   . History of kidney stones   . Hypertension    treated with HCTZ  . Parkinson's disease ([place])    dx'ed 15 years ago  . PTSD (post-traumatic stress disorder)   . Shortness of breath dyspnea   . Sleep apnea    doesn't use C-pap  . Varicose veins     PAST SURGICAL HISTORY:   Past Surgical History:  Procedure Laterality Date  . CHOLECYSTECTOMY N/A 10/22/2014   Procedure: LAPAROSCOPIC CHOLECYSTECTOMY WITH INTRAOPERATIVE CHOLANGIOGRAM;  Surgeon: Dia Crawford III, MD;  Location: [facility] ORS;  Service: General;  Laterality: N/A;  . cyst removed      from lip as a child  . LUMBAR LAMINECTOMY/DECOMPRESSION MICRODISCECTOMY Bilateral 12/14/2012   Procedure: Bilateral lumbar three-four, four-five decompressive laminotomy/foraminotomy;  Surgeon: Charlie Pitter, MD;  Location: [place] NEURO ORS;  Service: Neurosurgery;  Laterality: Bilateral;  . PULSE GENERATOR IMPLANT Bilateral 12/13/2013   Procedure: Bilateral implantable pulse generator placement;  Surgeon: Erline Levine, MD;  Location: [place] NEURO ORS;  Service: Neurosurgery;  Laterality: Bilateral;  Bilateral implantable pulse generator placement  . skin cancer removed     from ears,   12 lft arm  rt leg 15  . SUBTHALAMIC STIMULATOR INSERTION  Bilateral 12/06/2013   Procedure: SUBTHALAMIC STIMULATOR INSERTION;  Surgeon: Erline Levine, MD;  Location: [place] NEURO ORS;  Service: Neurosurgery;  Laterality: Bilateral;  Bilateral deep brain stimulator placement    SOCIAL HISTORY:   Social History   Social History  . Marital status: Married    Spouse name: N/A  . Number of children: N/A  . Years of education: N/A   Occupational History  . disabled     2001, [employer]  .      search and rescue helicopter   Social History Main Topics  . Smoking status: Never Smoker  .  Smokeless tobacco: Former Systems developer  . Alcohol use 0.0 oz/week     Comment: occasional (twice per month)  . Drug use: No  . Sexual activity: No   Other Topics Concern  . 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  Prev search and rescue work   3 kids from prev relationship.    Married 2014    FAMILY HISTORY:   Family Status  Relation Status  . Mother Alive   Arthritis, unknown medical hx  . Father Deceased   Lung Cancer  . Sister Alive   1 and one half sister  . Brother Alive  . Son Alive  . Daughter Alive   2  . Neg Hx     ROS:  A complete 10 system review of systems was obtained and was unremarkable apart from what is mentioned above.  PHYSICAL EXAMINATION:    VITALS:   Vitals:   08/16/16 1409  BP: 114/62  Pulse: (!) 48  SpO2: 95%  Weight: 183 lb (83 kg)  Height: 5\' 8"  (1.727 m)   Wt Readings from Last 3 Encounters:  08/16/16 183 lb (83 kg)  07/29/16 184 lb (83.5 kg)  07/25/16 183 lb 8 oz (83.2 kg)     GEN:  The patient appears stated age and is in NAD.   Affect more flat today. HEENT:  Normocephalic, atraumatic.  The mucous membranes are moist. The superficial temporal arteries are without ropiness or tenderness. Cardiovascular: bradycardic.  Regular rhythym. Lungs: Clear to auscultation bilaterally   Neurological examination:  Orientation: The patient is alert and oriented x3.  He is awake  Cranial  nerves: There is good facial symmetry.  The speech is fluent but dysarthric but better than last visit with louder speech as well.    Soft palate rises symmetrically and there is no tongue deviation. Hearing is intact to conversational tone. Sensation: Sensation is intact to light touch throughout. Motor: Strength is 5/5 in the bilateral upper and lower extremities.   Shoulder shrug is equal and symmetric.  There is no pronator drift. Deep tendon reflexes: Deferred today.  Movement examination: Tone: There is normal tone today Abnormal movements: There is no tremor.  There is rare dyskinesia in the R leg  Coordination: There is good RAMs today Gait and Station: The patient has no difficulty arising out of a deep-seated chair without the use of the hands. The patient's stride length is good.  Is not dragging the leg. Walks well with the U step  DBS programming was performed today which is described in more detail on a separate programming procedural notes.   ASSESSMENT/PLAN:  1.  Parkinsons disease by hx.   -The patient is status post bilateral STN DBS on 12/06/2013 with battery placement on 12/13/2013.  -off of mirapex and selegeline  -continue carbidopa/levodopa 25/100, 2/1/1.     -use walker at all times.  Never falls with it but has multiple times without it.  -talked about rock steady boxing and PD dance program at [facility] [place] 2.  LBP.   -Status post repeat L4-L5 laminotomy with foraminotomy and L1-L2 laminectomy on 06/13/2014.   3.  Dysphagia  - He did have a modified barium swallow on 08/07/2015.  It demonstrated mild oral pharyngeal dysphagia (an improvement from 2015 examination) and a regular diet with thin liquids was recommended.  4.  OSAS with excessive daytime hypersomnolence.  -We talked about morbidity and mortality associated with untreated sleep  apnea.  Sleep apnea appeared overall very mild (I believe AHI 10).  He was not able to tolerate the cpap despite trying multiple  masks.   Sleeping for hours in day but still sleeping well at night.   This still may be primary etiology for EDS as multiple other etiologies have been explored and not found.  He is really on few meds that cause sleepiness.  Asked him about consideration of sleep consult to see if they had further input and he stated that he saw a pulmonologist at [place] in past (Dr. Ashby Dawes).  Will try to get him an appt.  They would like to transfer to [facility] practice.  I will see if that is possible. 5.  Depression  -on Wellbutrin XL, 450 mg and lexapro and doing better.  Is getting ready to see new counselor at [facility]  -felt near suicidal on klonopin 6.  Early Hallucinations  -arising out of sleep but he is awake when they happen  -Took Nuplazid but felt didn't help.  Pt feels overall better so held on more medication 7.  RBD  -Still acting out the dreams some, but felt almost near suicidal on clonazepam.  Bedroom safety discussed.  Wife brought video of it today.  Await sleep consult input.  rozerem may be of value.   8. Bradycardia  -cardiology following.  Cardiology doesn't think that this is etiology for fatigue 9.  Memory loss  -need to repeat neuropsych testing.  He is scheduled in June for this.  No driving until then.  Expressed understanding. 10.  Follow-up in the next 5-6 months, sooner should new neurologic issues arise.  Much greater than 50% of this visit was spent in counseling with the patient and the family.  Total face to face time:  40 min not including programming time

## 2016-08-16 ENCOUNTER — Ambulatory Visit (INDEPENDENT_AMBULATORY_CARE_PROVIDER_SITE_OTHER): Payer: Medicare PPO | Admitting: Neurology

## 2016-08-16 ENCOUNTER — Encounter: Payer: Self-pay | Admitting: Neurology

## 2016-08-16 VITALS — BP 114/62 | HR 48 | Ht 68.0 in | Wt 183.0 lb

## 2016-08-16 DIAGNOSIS — G20 Parkinson's disease: Secondary | ICD-10-CM

## 2016-08-16 DIAGNOSIS — G4752 REM sleep behavior disorder: Secondary | ICD-10-CM | POA: Diagnosis not present

## 2016-08-16 DIAGNOSIS — Z9689 Presence of other specified functional implants: Secondary | ICD-10-CM

## 2016-08-16 NOTE — Procedures (Signed)
DBS Programming was performed.    Total time spent programming was 20 minutes.  Device was confirmed to be on.  Soft start was confirmed to be on at 8.  Impedences were checked and were within normal limits.  Battery was checked and was determined to be functioning normally and not near the end of life.   This was all documented on a separate neurophysiologic worksheet.  Final settings were as follows:  Left brain electrode:     1-C+           ; Amplitude  4.0   V   ; Pulse width 90 microseconds;   Frequency   130   Hz.  Right brain electrode:     2-C+          ; Amplitude   4.3  V ;  Pulse width 60  microseconds;  Frequency   140    Hz.

## 2016-08-17 ENCOUNTER — Telehealth: Payer: Self-pay | Admitting: Clinical

## 2016-08-17 NOTE — Telephone Encounter (Signed)
My Chart message sent to patient with information on [employer] at [facility] and Cytogeneticist for Parkinson's classes at [facility].

## 2016-09-02 ENCOUNTER — Ambulatory Visit (INDEPENDENT_AMBULATORY_CARE_PROVIDER_SITE_OTHER): Payer: Medicare PPO | Admitting: Family Medicine

## 2016-09-02 ENCOUNTER — Encounter: Payer: Self-pay | Admitting: Family Medicine

## 2016-09-02 ENCOUNTER — Other Ambulatory Visit (INDEPENDENT_AMBULATORY_CARE_PROVIDER_SITE_OTHER): Payer: Medicare PPO

## 2016-09-02 DIAGNOSIS — Z1211 Encounter for screening for malignant neoplasm of colon: Secondary | ICD-10-CM | POA: Diagnosis not present

## 2016-09-02 DIAGNOSIS — R21 Rash and other nonspecific skin eruption: Secondary | ICD-10-CM

## 2016-09-02 LAB — FECAL OCCULT BLOOD, IMMUNOCHEMICAL: FECAL OCCULT BLD: NEGATIVE

## 2016-09-02 MED ORDER — TRIAMCINOLONE ACETONIDE 0.5 % EX CREA: 1.0000 "application " | TOPICAL_CREAM | Freq: Two times a day (BID) | CUTANEOUS | 1 refills | Status: DC | PRN

## 2016-09-02 NOTE — Patient Instructions (Addendum)
Use hydrocortisone and if not better then use TAC as needed.  Take care.  Glad to see you.

## 2016-09-02 NOTE — Progress Notes (Signed)
Rash.  B but R>L forearms.  occ noted on the R leg.  Rash predates the tick bite.  occ noted on the trunk.  Rash had been coming and going for a few weeks.  Worse in the AM, less itchy at the day goes on.  Tried cortisone cream with some transient relief.  Possible poison ivy exposure, had been out in the woods/yard.  No palmar sx.  Was wearing gloves when working outside.    Tick bite in L axilla.  It was not engorged.     Meds, vitals, and allergies reviewed.   ROS: Per HPI unless specifically indicated in ROS section   nad ncat Speech at baseline Skin exam with tick bite site in the L axilla w/o retained parts and no spreading rash locally.  Rash on B but R>L forearms, with irregular nondermatomal blanching erythematous maculopapular rash. No lesions on the palms. No ulceration.

## 2016-09-03 DIAGNOSIS — R21 Rash and other nonspecific skin eruption: Secondary | ICD-10-CM | POA: Insufficient documentation

## 2016-09-03 NOTE — Assessment & Plan Note (Signed)
The tick bite site looks unremarkable. No reason to suspect systemic disease from that. If he has atypical symptoms then he can let me know.  The other rash is likely from poison ivy or similar. Okay to use hydrocortisone that he has at the house and if needed can use triamcinolone. Update me as needed. He agrees.

## 2016-09-05 ENCOUNTER — Encounter: Payer: Self-pay | Admitting: *Deleted

## 2016-09-08 ENCOUNTER — Encounter: Payer: Self-pay | Admitting: Psychology

## 2016-09-08 ENCOUNTER — Ambulatory Visit (INDEPENDENT_AMBULATORY_CARE_PROVIDER_SITE_OTHER): Payer: Medicare PPO | Admitting: Psychology

## 2016-09-08 DIAGNOSIS — F4321 Adjustment disorder with depressed mood: Secondary | ICD-10-CM

## 2016-09-14 ENCOUNTER — Emergency Department (HOSPITAL_COMMUNITY): Payer: Medicare PPO

## 2016-09-14 ENCOUNTER — Telehealth: Payer: Self-pay | Admitting: Family Medicine

## 2016-09-14 ENCOUNTER — Encounter (HOSPITAL_COMMUNITY): Payer: Self-pay | Admitting: Emergency Medicine

## 2016-09-14 ENCOUNTER — Emergency Department (HOSPITAL_COMMUNITY)
Admission: EM | Admit: 2016-09-14 | Discharge: 2016-09-14 | Disposition: A | Discharge status: Home / Self Care | Payer: Medicare PPO | Attending: Emergency Medicine | Admitting: Emergency Medicine

## 2016-09-14 DIAGNOSIS — Z85828 Personal history of other malignant neoplasm of skin: Secondary | ICD-10-CM | POA: Insufficient documentation

## 2016-09-14 DIAGNOSIS — Z79899 Other long term (current) drug therapy: Secondary | ICD-10-CM | POA: Insufficient documentation

## 2016-09-14 DIAGNOSIS — M25551 Pain in right hip: Secondary | ICD-10-CM | POA: Diagnosis not present

## 2016-09-14 DIAGNOSIS — I10 Essential (primary) hypertension: Secondary | ICD-10-CM | POA: Insufficient documentation

## 2016-09-14 DIAGNOSIS — M545 Low back pain, unspecified: Secondary | ICD-10-CM

## 2016-09-14 DIAGNOSIS — Y92019 Unspecified place in single-family (private) house as the place of occurrence of the external cause: Secondary | ICD-10-CM | POA: Diagnosis not present

## 2016-09-14 DIAGNOSIS — S299XXA Unspecified injury of thorax, initial encounter: Secondary | ICD-10-CM | POA: Diagnosis not present

## 2016-09-14 DIAGNOSIS — Y999 Unspecified external cause status: Secondary | ICD-10-CM | POA: Diagnosis not present

## 2016-09-14 DIAGNOSIS — R079 Chest pain, unspecified: Secondary | ICD-10-CM | POA: Diagnosis not present

## 2016-09-14 DIAGNOSIS — S3993XA Unspecified injury of pelvis, initial encounter: Secondary | ICD-10-CM | POA: Diagnosis not present

## 2016-09-14 DIAGNOSIS — W19XXXA Unspecified fall, initial encounter: Secondary | ICD-10-CM

## 2016-09-14 DIAGNOSIS — W1839XA Other fall on same level, initial encounter: Secondary | ICD-10-CM | POA: Insufficient documentation

## 2016-09-14 DIAGNOSIS — Y9301 Activity, walking, marching and hiking: Secondary | ICD-10-CM | POA: Insufficient documentation

## 2016-09-14 DIAGNOSIS — G20 Parkinson's disease: Secondary | ICD-10-CM

## 2016-09-14 DIAGNOSIS — S3992XA Unspecified injury of lower back, initial encounter: Secondary | ICD-10-CM | POA: Diagnosis not present

## 2016-09-14 NOTE — Telephone Encounter (Signed)
[facility] Patient Name: NASHTON BELSON DOB: April 20, 1950 Initial Comment Caller says her husband has Parkinson's and he has fallen a few times this past few days and now his back is hurting , he has already had 3 back surgeries Nurse Assessment Nurse: Markus Daft, RN, Sherre Poot Date/Time (Eastern Time): 09/14/2016 10:21:10 AM Confirm and document reason for call. If symptomatic, describe symptoms. ---Caller states that her husband has fallen a few times this past few days and now c/o his lower back is hurting. Rates 5/10 when he woke up, and up to 8-9/10 at times when he bends forward to stand up. Does the patient have any new or worsening symptoms? ---Yes Will a triage be completed? ---Yes Related visit to physician within the last 2 weeks? ---No Does the PT have any chronic conditions? (i.e. diabetes, asthma, etc.) ---Yes List chronic conditions. ---Parkinson's disease, weakness in right hip and leg, h/o 3 back surgeries - last one was 2 years ago, Bradycardia Is this a behavioral health or substance abuse call? ---No Guidelines Guideline Title Affirmed Question Affirmed Notes Back Injury [1] Landed hard on feet or buttocks AND [2] pain over spine Final Disposition User See Physician within 4 Hours (or PCP triage) Markus Daft, RN, Sherre Poot Comments No appts available at [facility] or [facility] [employer] Caller will go to ER instead of an UC d/t his history. Referrals REFERRED TO PCP OFFICE Disagree/Comply: Comply

## 2016-09-14 NOTE — Discharge Instructions (Signed)
Workup for the falls without any acute bony injuries. No evidence of hip fracture no evidence of any new bony low back injury. And the pelvis shows no evidence of any injury. Also chest x-ray was negative. Follow-up with your doctors as needed. Thank you for your service.

## 2016-09-14 NOTE — ED Notes (Signed)
Pt going to xray  

## 2016-09-14 NOTE — ED Provider Notes (Signed)
[facility] DEPT Provider Note   CSN: [id number] Arrival date & time: 09/14/16  1200  By signing my name below, I, Levester Fresh, attest that this documentation has been prepared under the direction and in the presence of Fredia Sorrow, MD . Electronically Signed: Levester Fresh, Scribe. 09/14/2016. 5:08 PM.  History   Chief Complaint Chief Complaint  Patient presents with  . Fall  . Back Pain   HPI Comments [name] is a 66 y.o. male with a PMHx significant for Parkinson's disease s/p 2 deep brain stimulators, chronic bradycardia and HTN who presents to the Emergency Department with complaints of lower back, hip and right leg pain s/p multiple falls. Per wife, who is at bedside, pt is averaging 1-3 falls per day, falling backwards off of his walker. Last week, pt fell backward into a side table, where his chest hit the leg on impact. Pt endorses chest pain and dyspnea 2/2 this.  No new numbness or weakness. Sensation normal to feet. Pt is not on any blood thinners currently. No headaches.  Pt reports recent change in depth perception, however, wife notes that he has been experiencing hallucinations as a medication side effect.  Pt denies experiencing any other acute sx, including chills, fever or difficulty urinating.   The history is provided by the patient and medical records. No language interpreter was used.   Past Medical History:  Diagnosis Date  . Arthritis   . Bradycardia   . Cancer [facility]) 2013   skin cancer  . Depression    ptsd  . Dysrhythmia    chronic slow heart rate  . GERD (gastroesophageal reflux disease)   . Headache(784.0)    tension headaches non recent  . History of chicken pox   . History of kidney stones   . Hypertension    treated with HCTZ  . Parkinson's disease ([place])    dx'ed 15 years ago  . PTSD (post-traumatic stress disorder)   . Shortness of breath dyspnea   . Sleep apnea    doesn't use C-pap  . Varicose veins     Patient  Active Problem List   Diagnosis Date Noted  . Rash 09/03/2016  . GERD (gastroesophageal reflux disease) 07/31/2016  . Trochanteric bursitis 03/03/2016  . Left hand pain 10/30/2015  . Fracture, finger, multiple sites 10/30/2015  . Colon cancer screening 07/23/2015  . Healthcare maintenance 07/23/2015  . Gout 02/18/2015  . Depression 01/06/2015  . Irritation of eyelid 08/20/2014  . Dupuytren's contracture 08/20/2014  . Cough 07/21/2014  . Lumbar stenosis with neurogenic claudication 06/13/2014  . Preop cardiovascular exam 05/28/2014  . S/P deep brain stimulator placement 05/08/2014  . PD (Parkinson's disease) ([place]) 05/08/2014  . Joint pain 01/22/2014  . Medicare annual wellness visit, initial 01/19/2014  . Advance care planning 01/19/2014  . Parkinson's disease ([place]) 12/13/2013  . PTSD (post-traumatic stress disorder) 06/13/2013  . Erectile dysfunction 06/13/2013  . HLD (hyperlipidemia) 06/13/2013  . Hip pain 06/10/2013  . Pain in joint, shoulder region 06/10/2013  . Right leg swelling 06/03/2013  . Essential hypertension 06/03/2013  . Bradycardia by electrocardiogram 06/03/2013  . Obstructive sleep apnea 03/12/2013  . Spinal stenosis, lumbar region, with neurogenic claudication 12/14/2012    Past Surgical History:  Procedure Laterality Date  . CHOLECYSTECTOMY N/A 10/22/2014   Procedure: LAPAROSCOPIC CHOLECYSTECTOMY WITH INTRAOPERATIVE CHOLANGIOGRAM;  Surgeon: Dia Crawford III, MD;  Location: [facility] ORS;  Service: General;  Laterality: N/A;  . cyst removed  from lip as a child  . LUMBAR LAMINECTOMY/DECOMPRESSION MICRODISCECTOMY Bilateral 12/14/2012   Procedure: Bilateral lumbar three-four, four-five decompressive laminotomy/foraminotomy;  Surgeon: Charlie Pitter, MD;  Location: [place] NEURO ORS;  Service: Neurosurgery;  Laterality: Bilateral;  . PULSE GENERATOR IMPLANT Bilateral 12/13/2013   Procedure: Bilateral implantable pulse generator placement;  Surgeon: Erline Levine, MD;   Location: [facility] NEURO ORS;  Service: Neurosurgery;  Laterality: Bilateral;  Bilateral implantable pulse generator placement  . skin cancer removed     from ears,   12 lft arm  rt leg 15  . SUBTHALAMIC STIMULATOR INSERTION Bilateral 12/06/2013   Procedure: SUBTHALAMIC STIMULATOR INSERTION;  Surgeon: Erline Levine, MD;  Location: [place] NEURO ORS;  Service: Neurosurgery;  Laterality: Bilateral;  Bilateral deep brain stimulator placement       Home Medications    Prior to Admission medications   Medication Sig Start Date End Date Taking? Authorizing Provider  Artificial Tear Ointment (DRY EYES OP) Apply to eye as needed.     [provider]  buPROPion (WELLBUTRIN XL) 150 MG 24 hr tablet Take 3 tablets (450 mg total) by mouth daily. 07/29/16   Tonia Ghent, MD  carbidopa-levodopa (SINEMET IR) 25-100 MG per tablet Take by mouth. 2 AM/ 1 afternoon/ 1 evening    [provider]  cholecalciferol (VITAMIN D) 1000 UNITS tablet Take 1,000 Units by mouth daily.    [provider]  escitalopram (LEXAPRO) 10 MG tablet Take 1 tablet (10 mg total) by mouth daily. 07/29/16   Tonia Ghent, MD  naproxen (NAPROSYN) 500 MG tablet Take 500 mg by mouth 2 (two) times daily with a meal.    [provider]  omeprazole (PRILOSEC) 20 MG capsule Take 1 capsule (20 mg total) by mouth daily. 07/29/16   Tonia Ghent, MD  pravastatin (PRAVACHOL) 20 MG tablet Take 1 tablet (20 mg total) by mouth every evening. 06/06/16   Minna Merritts, MD  triamcinolone cream (KENALOG) 0.5 % Apply 1 application topically 2 (two) times daily as needed. 09/02/16   Tonia Ghent, MD    Family History Family History  Problem Relation Age of Onset  . Lung cancer Father   . Colon cancer Neg Hx   . Prostate cancer Neg Hx     Social History Social History  Substance Use Topics  . Smoking status: Never Smoker  . Smokeless tobacco: Former Systems developer  . Alcohol use 0.0 oz/week     Comment: occasional  (twice per month)     Allergies   Klonopin [clonazepam]; Morphine and related; and Prednisone   Review of Systems Review of Systems  Constitutional: Negative for chills.  HENT: Negative for congestion and sore throat.   Eyes: Positive for visual disturbance.  Cardiovascular: Negative for leg swelling.  Gastrointestinal: Negative for diarrhea, nausea and vomiting.  Genitourinary: Negative for difficulty urinating and hematuria.  Skin: Positive for rash.  Hematological: Does not bruise/bleed easily.  Psychiatric/Behavioral: Positive for hallucinations.  All other systems reviewed and are negative.  Physical Exam Updated Vital Signs BP 121/73 (BP Location: Right Arm)   Pulse (!) 50   Temp 98.7 F (37.1 C) (Oral)   Resp 18   SpO2 100%   Physical Exam  Constitutional: He is oriented to person, place, and time. He appears well-developed and well-nourished. No distress.  HENT:  Head: Normocephalic and atraumatic.  Eyes: Conjunctivae and EOM are normal. Pupils are equal, round, and reactive to light. No scleral icterus.  Neck: Normal range  of motion.  Cardiovascular: Regular rhythm and normal heart sounds.  Exam reveals no gallop and no friction rub.   No murmur heard. Heart is bradycardic, pt's baseline.  Pulmonary/Chest: Effort normal and breath sounds normal. No respiratory distress. He has no wheezes. He has no rales.  Sternal tenderness to palpation.  Abdominal: Soft. Bowel sounds are normal. There is no tenderness.  Musculoskeletal: Normal range of motion. He exhibits no edema, tenderness or deformity.  Neurological: He is alert and oriented to person, place, and time. No cranial nerve deficit or sensory deficit. He exhibits normal muscle tone.  Psychiatric: He has a normal mood and affect.  Nursing note and vitals reviewed.  ED Treatments / Results  DIAGNOSTIC STUDIES: Oxygen Saturation is 100% on room air, normal by my interpretation.    COORDINATION OF CARE: 3:45  PM Discussed treatment plan with pt and wife at bedside. They agreed to plan.  Labs (all labs ordered are listed, but only abnormal results are displayed) Labs Reviewed - No data to display  EKG  EKG Interpretation None       Radiology Dg Chest 2 View  Result Date: 09/14/2016 CLINICAL DATA:  Fall with right hip pain.  Initial encounter. EXAM: CHEST  2 VIEW COMPARISON:  09/15/2015 FINDINGS: Mildly low lung volumes. Bilateral chest battery packs for deep brain stimulator. There is no edema, consolidation, effusion, or pneumothorax. Normal heart size and mediastinal contours. No acute osseous finding. Cholecystectomy clips. IMPRESSION: No active cardiopulmonary disease. Electronically Signed   By: Monte Fantasia M.D.   On: 09/14/2016 16:39   Dg Pelvis 1-2 Views  Result Date: 09/14/2016 CLINICAL DATA:  Status post fall EXAM: PELVIS - 1-2 VIEW COMPARISON:  Coronal and sagittal CT images from abdominal and pelvic CT scan of October 21, 2014 FINDINGS: The bones are subjectively adequately mineralized. There is no acute fracture of the pelvis. There is no lytic or blastic lesion. The hip joint spaces are reasonably well-maintained. The sacrum and SI joints appear normal where visualized. The patient has undergone previous PLIF at L4-5. IMPRESSION: There is no acute bony abnormality of the pelvis. Electronically Signed   By: David  Martinique M.D.   On: 09/14/2016 16:34   Ct Lumbar Spine Wo Contrast  Result Date: 09/14/2016 CLINICAL DATA:  Low back pain since a fall last night. History of prior lumbar surgery. EXAM: CT LUMBAR SPINE WITHOUT CONTRAST TECHNIQUE: Multidetector CT imaging of the lumbar spine was performed without intravenous contrast administration. Multiplanar CT image reconstructions were also generated. COMPARISON:  CT abdomen and pelvis 10/21/2014. CT lumbar spine 04/29/2014. FINDINGS: Segmentation: Standard Alignment: There is exaggeration of the normal lumbar lordosis. Trace anterolisthesis  L4 on L5 is noted. The patient is status post L4-5 fusion. Vertebrae: No fracture or focal bony lesion. The patient is status post L4-5 fusion with solid bridging bone across the disc interspace and solid posterior fusion mass. Paraspinal and other soft tissues: Aortic atherosclerosis is noted. 0.7 cm nonobstructing stone lower pole left kidney is seen. Disc levels: T12-L1:  Negative. L1-2: Mild to moderate facet degenerative disease and a shallow disc bulge. The central canal and foramina appear open. L2-3: There appears to be a broad-based central protrusion causing moderate central canal stenosis. Facet arthropathy is noted. Mild to moderate left foraminal narrowing is seen. The right foramen appears open. L3-4: Shallow disc bulge endplate spur. Facet degenerative change is seen. The appears to be moderate central canal stenosis. The foramina appear markedly narrowed bilaterally. L4-5: Status post discectomy and  fusion. The central canal and foramina appear widely patent. L5-S1: There is vacuum disc phenomenon and loss of disc space height. Broad-based disc bulge is seen and causes narrowing in the subarticular recesses. There is mild to moderate central canal stenosis overall. Moderate to moderately severe bilateral foraminal narrowing is identified. IMPRESSION: Negative for fracture or other acute finding. Status post L4-5 fusion without evidence complication. Lumbar spondylosis appearing worst at L2-3, L3-4 and L5-S1 as described. Aortic atherosclerosis without aneurysm. A 0.7 cm nonobstructing stone lower pole left kidney. Electronically Signed   By: Inge Rise M.D.   On: 09/14/2016 17:00   Ct Hip Right Wo Contrast  Result Date: 09/14/2016 CLINICAL DATA:  Right hip pain, back pain status post fall EXAM: CT OF THE RIGHT HIP WITHOUT CONTRAST TECHNIQUE: Multidetector CT imaging of the right hip was performed according to the standard protocol. Multiplanar CT image reconstructions were also generated.  COMPARISON:  None. FINDINGS: Bones/Joint/Cartilage No acute fracture or dislocation. Hip joint space is maintained. Normal alignment. No joint effusion. Mild osteoarthritis of the right sacroiliac joint. Degenerative disc disease with disc height loss at L5-S1 with bilateral mild facet arthropathy. Posterior lumbar interbody fusion partially visualize at L4-5. No aggressive lytic or sclerotic osseous lesion. Ligaments Ligaments are suboptimally evaluated by CT. Muscles and Tendons Muscles are normal. No muscle atrophy. No intramuscular fluid collection or hematoma. Soft tissue No fluid collection or hematoma. No soft tissue mass. Peripheral vascular atherosclerotic disease. IMPRESSION: 1.  No acute osseous injury of the right hip. 2. Degenerative disc disease with disc height loss at L5-S1 with bilateral mild facet arthropathy. 3. Posterior lumbar interbody fusion partially visualize at L4-5. Electronically Signed   By: Kathreen Devoid   On: 09/14/2016 17:01    Procedures Procedures (including critical care time)  Medications Ordered in ED Medications - No data to display   Initial Impression / Assessment and Plan / ED Course  I have reviewed the triage vital signs and the nursing notes.  Pertinent labs & imaging results that were available during my care of the patient were reviewed by me and considered in my medical decision making (see chart for details).     Patient with the significant Parkinson's disease. Patient with falls 1-3 times a day. Patient's complaint today for back pain hip pain and anterior chest pain without any acute findings on workup.    I personally performed the services described in this documentation, which was scribed in my presence. The recorded information has been reviewed and is accurate.    Final Clinical Impressions(s) / ED Diagnoses   Final diagnoses:  Fall    New Prescriptions New Prescriptions   No medications on file     Fredia Sorrow,  MD 09/14/16 [phone]

## 2016-09-14 NOTE — Telephone Encounter (Signed)
Noted currently at ER.

## 2016-09-14 NOTE — ED Triage Notes (Signed)
Pt sts multiple falls every day from parkinson's pt sts lower back pain after fall last night

## 2016-09-23 ENCOUNTER — Encounter: Payer: Self-pay | Admitting: Family Medicine

## 2016-09-23 ENCOUNTER — Ambulatory Visit (INDEPENDENT_AMBULATORY_CARE_PROVIDER_SITE_OTHER): Payer: Medicare PPO | Admitting: Family Medicine

## 2016-09-23 VITALS — BP 112/80 | HR 52 | Temp 97.9°F | Wt 184.2 lb

## 2016-09-23 DIAGNOSIS — G20 Parkinson's disease: Secondary | ICD-10-CM | POA: Diagnosis not present

## 2016-09-23 MED ORDER — TRIAMCINOLONE ACETONIDE 0.5 % EX CREA: 1.0000 "application " | TOPICAL_CREAM | Freq: Two times a day (BID) | CUTANEOUS | 1 refills | Status: DC | PRN

## 2016-09-23 NOTE — Progress Notes (Signed)
Mult falls.  Using U step walker now.  He feels better with that some of the time, depending on the day.    ER f/u with imaging neg for acute changes.    They have tried clear obstructions from his walking path in the house. They have a 67 year old at home part of the time and that complicates his situation.  He wanted to get OT and PT set up at home. That is reasonable, d/w pt.    Has transfer bench for shower but that crowds the bathroom.  Needs toilet set with hands to help getting on/off toilet.    He has a ramp at the house.    He is awaiting VA disability decision.    His pain is some better in the meantime.  L elbow still abraded. Taking naproxen for pain, that is manageable.  He didn't want to take more potent meds, due to fall risk.    Still with some itching but the rash on the forearm is less prominent.  Needed refill of TAC- they had lost the rx.  Has derm f/u pending.   Meds, vitals, and allergies reviewed.   ROS: Per HPI unless specifically indicated in ROS section   nad ncat Speech at baseline Neck supple rrr ctab Abd soft, normal BS Ext w/o edema L elbow clean with superficial abrasion.  Gait slow, at baseline, using walker, symmetric gait.

## 2016-09-23 NOTE — Patient Instructions (Signed)
[name] will call about your referral. Ask PT about home furniture rearrangement.   Use the raised toilet seat.  Take care.  Glad to see you.  Update me as needed.

## 2016-09-26 ENCOUNTER — Encounter: Payer: Self-pay | Admitting: *Deleted

## 2016-09-26 ENCOUNTER — Telehealth: Payer: Self-pay | Admitting: Family Medicine

## 2016-09-26 NOTE — Telephone Encounter (Signed)
See mychart message.  Please fax order for beside commode.   Dx G20.   Thanks.

## 2016-09-26 NOTE — Assessment & Plan Note (Signed)
D/w pt about options.  Refer to PT.  Ask PT about home furniture rearrangement.   Use the raised toilet seat, rx given to patient at [facility].  No change in meds, defer to neuro.   He is working to make his home environment as obstacle free as possible.  >25 minutes spent in face to face time with patient, >50% spent in counselling or coordination of care.

## 2016-09-26 NOTE — Telephone Encounter (Signed)
Order printed, signed and faxed to [facility].

## 2016-09-27 DIAGNOSIS — L821 Other seborrheic keratosis: Secondary | ICD-10-CM | POA: Diagnosis not present

## 2016-09-27 DIAGNOSIS — L282 Other prurigo: Secondary | ICD-10-CM | POA: Diagnosis not present

## 2016-09-28 ENCOUNTER — Ambulatory Visit: Payer: Self-pay | Admitting: Neurology

## 2016-09-28 DIAGNOSIS — M6281 Muscle weakness (generalized): Secondary | ICD-10-CM | POA: Diagnosis not present

## 2016-09-28 DIAGNOSIS — F329 Major depressive disorder, single episode, unspecified: Secondary | ICD-10-CM | POA: Diagnosis not present

## 2016-09-28 DIAGNOSIS — I10 Essential (primary) hypertension: Secondary | ICD-10-CM | POA: Diagnosis not present

## 2016-09-28 DIAGNOSIS — F4310 Post-traumatic stress disorder, unspecified: Secondary | ICD-10-CM | POA: Diagnosis not present

## 2016-09-28 DIAGNOSIS — M109 Gout, unspecified: Secondary | ICD-10-CM | POA: Diagnosis not present

## 2016-09-28 DIAGNOSIS — M48062 Spinal stenosis, lumbar region with neurogenic claudication: Secondary | ICD-10-CM | POA: Diagnosis not present

## 2016-09-28 DIAGNOSIS — M720 Palmar fascial fibromatosis [Dupuytren]: Secondary | ICD-10-CM | POA: Diagnosis not present

## 2016-09-28 DIAGNOSIS — G4733 Obstructive sleep apnea (adult) (pediatric): Secondary | ICD-10-CM | POA: Diagnosis not present

## 2016-09-28 DIAGNOSIS — G20 Parkinson's disease: Secondary | ICD-10-CM | POA: Diagnosis not present

## 2016-09-29 ENCOUNTER — Ambulatory Visit (INDEPENDENT_AMBULATORY_CARE_PROVIDER_SITE_OTHER): Payer: Medicare PPO | Admitting: Psychology

## 2016-09-29 ENCOUNTER — Telehealth: Payer: Self-pay

## 2016-09-29 ENCOUNTER — Encounter: Payer: Self-pay | Admitting: Psychology

## 2016-09-29 DIAGNOSIS — G20A1 Parkinson's disease without dyskinesia, without mention of fluctuations: Secondary | ICD-10-CM

## 2016-09-29 DIAGNOSIS — R413 Other amnesia: Secondary | ICD-10-CM | POA: Diagnosis not present

## 2016-09-29 DIAGNOSIS — G20 Parkinson's disease: Secondary | ICD-10-CM

## 2016-09-29 DIAGNOSIS — F4323 Adjustment disorder with mixed anxiety and depressed mood: Secondary | ICD-10-CM | POA: Diagnosis not present

## 2016-09-29 NOTE — Telephone Encounter (Signed)
Physical therapist calls and requests either a verbal order or a faxed order for Speech Therapy eval and treat for the patient.  Therapist notes a decline with patient's speech and memory and there is noted dysphagia.  Please Advise if okay to move forward with referral.

## 2016-09-29 NOTE — Telephone Encounter (Signed)
Please give the order.  Thanks.   

## 2016-09-29 NOTE — Telephone Encounter (Signed)
Left detailed message on voicemail of [name].

## 2016-09-29 NOTE — Progress Notes (Signed)
NEUROPSYCHOLOGICAL INTERVIEW (CPT: [id number])  Name: [name] Date of Birth: [birth year] Date of Interview: 09/29/2016  Reason for Referral:  [name] is a 66 y.o. male who is referred for neuropsychological evaluation by Dr. Wells Guiles Tat of [facility] due to concerns about memory decline in the context of Parkinson's disease. This patient is unaccompanied in the office for today's appointment.   History of Presenting Problem:  [name] reportedly was diagnosed with PD in 2000. He has been followed by Dr. Carles Collet since early 2015. He underwent bilateral STN DBS on 12/06/2013 with generator placement on 12/13/2013. He went back on his previous dose of medication not long after the procedure but did notice improved PD symptoms. He had a neuropsychological evaluation with Dr. Nathanial Rancher at [facility] in April 2015, prior to the DBS procedure. The majority of his cognitive test performances at that time were within normal limits with the exception of one test of novel problem-solving and fine motor speed. He met criteria for a diagnosis of cognitive disorder due to a general medical condition in the mild range of severity. It was Dr. Jenne Campus opinion that the patient's mild executive dysfunction was related primarily to PD with an exacerbation due to untreated obstructive sleep apnea. There were no indications that the patient was experiencing the early stages of Alzheimer's disease or Parkinson's disease dementia. He also met criteria for a diagnosis of adjustment disorder with depressed and anxious mood likely as a result of biological and psychosocial mechanisms related to PD.   At today's visit, the patient reports a decline in cognitive functioning since his last evaluation in 2015. He reports that he had a very high IQ in the past and he feels it is "not that way" anymore. Upon direct questioning, the patient reported forgetfulness for recent conversations/things he  has been told, frequently misplacing or losing items, difficulty concentrating, slow information processing speed, word finding difficulty, and forgetting what he is intending to say mid-sentence. He denied any trouble with navigation or visual-spatial skills although he did stop driving 3-4 weeks ago. His wife manages all complex ADLs including medications, finances/bills, appointments and cooking.  He complains of significant balance problems with frequent falls. He states he falls even when using his walker. He has had to start using a wheelchair more often. He hurt his right shoulder in a recent fall. He does not think he has hit his head in any falls or lost consciousness.   He has a history of excessive daytime sleepiness. He reports sleep difficulties characterized by trouble staying asleep at night. He falls asleep very easily. He endorses visual hallucinations only when he is in bed at night. He states he sees "apparitions". They do not upset or bother him. He tried Nuplazid in the past but it apparently was not helpful. He reports ongoing difficulty with REM sleep behavior symptoms including movement and acting out dreams in his sleep. His wife frequently has to leave the bed because he accidentally hits her.  He endorses depressed mood, primarily related to being unable to do outdoor activities he used to enjoy. He spends much of his time watching TV. He endorses increased irritability. He reported reduced appetite and weight loss. He endorsed passive suicidal ideation (wishing he was no longer alive) but denied suicidal intention or planning.   Social History: Born/Raised: [place], [place] Education: Associate's degree in business Occupational history: Served in [employer] from 1969-2002. 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 Reportedly married five times in total, with three children Alcohol:  Occasional Tobacco: Never a smoker; former smokeless tobacco user. SA: Denied   Medical History: Past Medical History:  Diagnosis Date  . Arthritis   . Bradycardia   . Cancer [facility]) 2013   skin cancer  . Depression    ptsd  . Dysrhythmia    chronic slow heart rate  . GERD (gastroesophageal reflux disease)   . Headache(784.0)    tension headaches non recent  . History of chicken pox   . History of kidney stones   . Hypertension    treated with HCTZ  . Parkinson's disease ([place])    dx'ed 15 years ago  . PTSD (post-traumatic stress disorder)   . Shortness of breath dyspnea   . Sleep apnea    doesn't use C-pap  . Varicose veins      Current Medications:  Outpatient Encounter Prescriptions as of 09/29/2016  Medication Sig  . Artificial Tear Ointment (DRY EYES OP) Apply to eye as needed.   Marland Kitchen buPROPion (WELLBUTRIN XL) 150 MG 24 hr tablet Take 3 tablets (450 mg total) by mouth daily.  . carbidopa-levodopa (SINEMET IR) 25-100 MG per tablet Take by mouth. 2 AM/ 1 afternoon/ 1 evening  . cholecalciferol (VITAMIN D) 1000 UNITS tablet Take 1,000 Units by mouth daily.  Marland Kitchen escitalopram (LEXAPRO) 10 MG tablet Take 1 tablet (10 mg total) by mouth daily.  . naproxen (NAPROSYN) 500 MG tablet Take 500 mg by mouth 2 (two) times daily with a meal.  . omeprazole (PRILOSEC) 20 MG capsule Take 1 capsule (20 mg total) by mouth daily.  . pravastatin (PRAVACHOL) 20 MG tablet Take 1 tablet (20 mg total) by mouth every evening.  . triamcinolone cream (KENALOG) 0.5 % Apply 1 application topically 2 (two) times daily as needed.   No facility-administered encounter medications on file as of 09/29/2016.      Behavioral Observations:   Appearance: Casually and appropriately dressed and groomed. Mild UE tremor observed. Gait: Ambulated with walker, significant imbalance (nearly fell several times), +foot drag Speech: Dysarthric, reduced volume, coughs frequently Thought process: Appears linear Affect:  Blunted, smiles appropriately occasionally Interpersonal: Pleasant, appropriate   TESTING: There is medical necessity to proceed with neuropsychological assessment as the results will be used to aid in differential diagnosis and clinical decision-making and to inform specific treatment recommendations. Per the patient and medical records reviewed, there has been a change in cognitive functioning and a reasonable suspicion of dementia.  Following the clinical interview, the patient completed a full battery of neuropsychological testing with my psychometrician under my supervision.   PLAN: The patient will return to see me for a follow-up session at which time his test performances and my impressions and treatment recommendations will be reviewed in detail.  Full report to follow.

## 2016-09-29 NOTE — Progress Notes (Signed)
   Neuropsychology Note  [name] came in today for 1 hour of neuropsychological testing with technician, Milana Kidney, BS, under the supervision of Dr. Macarthur Critchley. The patient did not appear overtly distressed by the testing session, per behavioral observation or via self-report to the technician. Rest breaks were offered. [name] will return within 2 weeks for a feedback session with Dr. Si Raider at which time his test performances, clinical impressions and treatment recommendations will be reviewed in detail. The patient understands he can contact our office should he require our assistance before this time.  Full report to follow.

## 2016-09-30 DIAGNOSIS — G20 Parkinson's disease: Secondary | ICD-10-CM | POA: Diagnosis not present

## 2016-09-30 DIAGNOSIS — M720 Palmar fascial fibromatosis [Dupuytren]: Secondary | ICD-10-CM | POA: Diagnosis not present

## 2016-09-30 DIAGNOSIS — M109 Gout, unspecified: Secondary | ICD-10-CM | POA: Diagnosis not present

## 2016-09-30 DIAGNOSIS — G4733 Obstructive sleep apnea (adult) (pediatric): Secondary | ICD-10-CM | POA: Diagnosis not present

## 2016-09-30 DIAGNOSIS — F4310 Post-traumatic stress disorder, unspecified: Secondary | ICD-10-CM | POA: Diagnosis not present

## 2016-09-30 DIAGNOSIS — M48062 Spinal stenosis, lumbar region with neurogenic claudication: Secondary | ICD-10-CM | POA: Diagnosis not present

## 2016-09-30 DIAGNOSIS — I10 Essential (primary) hypertension: Secondary | ICD-10-CM | POA: Diagnosis not present

## 2016-09-30 DIAGNOSIS — F329 Major depressive disorder, single episode, unspecified: Secondary | ICD-10-CM | POA: Diagnosis not present

## 2016-09-30 DIAGNOSIS — M6281 Muscle weakness (generalized): Secondary | ICD-10-CM | POA: Diagnosis not present

## 2016-10-03 DIAGNOSIS — I10 Essential (primary) hypertension: Secondary | ICD-10-CM | POA: Diagnosis not present

## 2016-10-03 DIAGNOSIS — F329 Major depressive disorder, single episode, unspecified: Secondary | ICD-10-CM | POA: Diagnosis not present

## 2016-10-03 DIAGNOSIS — H269 Unspecified cataract: Secondary | ICD-10-CM | POA: Diagnosis not present

## 2016-10-03 DIAGNOSIS — M48062 Spinal stenosis, lumbar region with neurogenic claudication: Secondary | ICD-10-CM | POA: Diagnosis not present

## 2016-10-03 DIAGNOSIS — Z6827 Body mass index (BMI) 27.0-27.9, adult: Secondary | ICD-10-CM | POA: Diagnosis not present

## 2016-10-03 DIAGNOSIS — M109 Gout, unspecified: Secondary | ICD-10-CM | POA: Diagnosis not present

## 2016-10-03 DIAGNOSIS — M6281 Muscle weakness (generalized): Secondary | ICD-10-CM | POA: Diagnosis not present

## 2016-10-03 DIAGNOSIS — K219 Gastro-esophageal reflux disease without esophagitis: Secondary | ICD-10-CM | POA: Diagnosis not present

## 2016-10-03 DIAGNOSIS — G20 Parkinson's disease: Secondary | ICD-10-CM | POA: Diagnosis not present

## 2016-10-03 DIAGNOSIS — G4733 Obstructive sleep apnea (adult) (pediatric): Secondary | ICD-10-CM | POA: Diagnosis not present

## 2016-10-03 DIAGNOSIS — M720 Palmar fascial fibromatosis [Dupuytren]: Secondary | ICD-10-CM | POA: Diagnosis not present

## 2016-10-03 DIAGNOSIS — F4310 Post-traumatic stress disorder, unspecified: Secondary | ICD-10-CM | POA: Diagnosis not present

## 2016-10-04 ENCOUNTER — Ambulatory Visit (INDEPENDENT_AMBULATORY_CARE_PROVIDER_SITE_OTHER): Payer: Medicare PPO | Admitting: Psychology

## 2016-10-04 DIAGNOSIS — F4321 Adjustment disorder with depressed mood: Secondary | ICD-10-CM | POA: Diagnosis not present

## 2016-10-05 DIAGNOSIS — F329 Major depressive disorder, single episode, unspecified: Secondary | ICD-10-CM | POA: Diagnosis not present

## 2016-10-05 DIAGNOSIS — M6281 Muscle weakness (generalized): Secondary | ICD-10-CM | POA: Diagnosis not present

## 2016-10-05 DIAGNOSIS — G20 Parkinson's disease: Secondary | ICD-10-CM | POA: Diagnosis not present

## 2016-10-05 DIAGNOSIS — M48062 Spinal stenosis, lumbar region with neurogenic claudication: Secondary | ICD-10-CM | POA: Diagnosis not present

## 2016-10-05 DIAGNOSIS — M109 Gout, unspecified: Secondary | ICD-10-CM | POA: Diagnosis not present

## 2016-10-05 DIAGNOSIS — G4733 Obstructive sleep apnea (adult) (pediatric): Secondary | ICD-10-CM | POA: Diagnosis not present

## 2016-10-05 DIAGNOSIS — F4310 Post-traumatic stress disorder, unspecified: Secondary | ICD-10-CM | POA: Diagnosis not present

## 2016-10-05 DIAGNOSIS — I10 Essential (primary) hypertension: Secondary | ICD-10-CM | POA: Diagnosis not present

## 2016-10-05 DIAGNOSIS — M720 Palmar fascial fibromatosis [Dupuytren]: Secondary | ICD-10-CM | POA: Diagnosis not present

## 2016-10-06 DIAGNOSIS — M6281 Muscle weakness (generalized): Secondary | ICD-10-CM | POA: Diagnosis not present

## 2016-10-06 DIAGNOSIS — M720 Palmar fascial fibromatosis [Dupuytren]: Secondary | ICD-10-CM | POA: Diagnosis not present

## 2016-10-06 DIAGNOSIS — G4733 Obstructive sleep apnea (adult) (pediatric): Secondary | ICD-10-CM | POA: Diagnosis not present

## 2016-10-06 DIAGNOSIS — I10 Essential (primary) hypertension: Secondary | ICD-10-CM | POA: Diagnosis not present

## 2016-10-06 DIAGNOSIS — F329 Major depressive disorder, single episode, unspecified: Secondary | ICD-10-CM | POA: Diagnosis not present

## 2016-10-06 DIAGNOSIS — M109 Gout, unspecified: Secondary | ICD-10-CM | POA: Diagnosis not present

## 2016-10-06 DIAGNOSIS — M48062 Spinal stenosis, lumbar region with neurogenic claudication: Secondary | ICD-10-CM | POA: Diagnosis not present

## 2016-10-06 DIAGNOSIS — G20 Parkinson's disease: Secondary | ICD-10-CM | POA: Diagnosis not present

## 2016-10-06 DIAGNOSIS — F4310 Post-traumatic stress disorder, unspecified: Secondary | ICD-10-CM | POA: Diagnosis not present

## 2016-10-07 ENCOUNTER — Ambulatory Visit (INDEPENDENT_AMBULATORY_CARE_PROVIDER_SITE_OTHER)
Admission: RE | Admit: 2016-10-07 | Discharge: 2016-10-07 | Disposition: A | Discharge status: Home / Self Care | Payer: Medicare PPO | Source: Ambulatory Visit | Attending: Family Medicine | Admitting: Family Medicine

## 2016-10-07 ENCOUNTER — Ambulatory Visit (INDEPENDENT_AMBULATORY_CARE_PROVIDER_SITE_OTHER): Payer: Medicare PPO | Admitting: Family Medicine

## 2016-10-07 ENCOUNTER — Encounter: Payer: Self-pay | Admitting: Family Medicine

## 2016-10-07 VITALS — BP 150/74 | HR 63 | Temp 98.6°F | Wt 179.5 lb

## 2016-10-07 DIAGNOSIS — M792 Neuralgia and neuritis, unspecified: Secondary | ICD-10-CM

## 2016-10-07 DIAGNOSIS — M542 Cervicalgia: Secondary | ICD-10-CM | POA: Diagnosis not present

## 2016-10-07 NOTE — Progress Notes (Signed)
R shoulder pain.  Lateral pain, moves down to the R hand.  Shooting pain.  Burning pain.  Quick onset with leaning forward or when tilting to the side.  R arm pain with head tilt to the R side but not to the L. H/o falls noted.    History of deep brain stimulator noted. Discussed with patient.  Meds, vitals, and allergies reviewed.   ROS: Per HPI unless specifically indicated in ROS section   nad Speech at baseline. Neck supple, not tender, R arm pain with head tilt to the R side but not to the L.  Symmetric arm movement and symmetric grip. No focal loss of sensation or acute gross motor loss in the upper extremities, but he does have his baseline motor changes related to Parkinson's as expected.

## 2016-10-07 NOTE — Patient Instructions (Signed)
Try naproxen twice day with food and call Dr. Marchelle Folks clinic in the meantime.  Take care.  Glad to see you.

## 2016-10-09 DIAGNOSIS — M792 Neuralgia and neuritis, unspecified: Secondary | ICD-10-CM | POA: Insufficient documentation

## 2016-10-09 NOTE — Assessment & Plan Note (Signed)
Complicated by Parkinson's, falls, deep brain stimulator. Discussed with patient about options. See notes on imaging. Stronger pain medicine would likely increase his fall risk, discussed with patient. In the meantime, we agreed that he can try naproxen twice day with food and call Dr. Marchelle Folks clinic for input.

## 2016-10-11 DIAGNOSIS — G4733 Obstructive sleep apnea (adult) (pediatric): Secondary | ICD-10-CM | POA: Diagnosis not present

## 2016-10-11 DIAGNOSIS — G20 Parkinson's disease: Secondary | ICD-10-CM | POA: Diagnosis not present

## 2016-10-11 DIAGNOSIS — M48062 Spinal stenosis, lumbar region with neurogenic claudication: Secondary | ICD-10-CM | POA: Diagnosis not present

## 2016-10-11 DIAGNOSIS — M109 Gout, unspecified: Secondary | ICD-10-CM | POA: Diagnosis not present

## 2016-10-11 DIAGNOSIS — F4310 Post-traumatic stress disorder, unspecified: Secondary | ICD-10-CM | POA: Diagnosis not present

## 2016-10-11 DIAGNOSIS — M6281 Muscle weakness (generalized): Secondary | ICD-10-CM | POA: Diagnosis not present

## 2016-10-11 DIAGNOSIS — M720 Palmar fascial fibromatosis [Dupuytren]: Secondary | ICD-10-CM | POA: Diagnosis not present

## 2016-10-11 DIAGNOSIS — F329 Major depressive disorder, single episode, unspecified: Secondary | ICD-10-CM | POA: Diagnosis not present

## 2016-10-11 DIAGNOSIS — I10 Essential (primary) hypertension: Secondary | ICD-10-CM | POA: Diagnosis not present

## 2016-10-12 DIAGNOSIS — M48062 Spinal stenosis, lumbar region with neurogenic claudication: Secondary | ICD-10-CM | POA: Diagnosis not present

## 2016-10-12 DIAGNOSIS — I10 Essential (primary) hypertension: Secondary | ICD-10-CM | POA: Diagnosis not present

## 2016-10-12 DIAGNOSIS — G20 Parkinson's disease: Secondary | ICD-10-CM | POA: Diagnosis not present

## 2016-10-12 DIAGNOSIS — M109 Gout, unspecified: Secondary | ICD-10-CM | POA: Diagnosis not present

## 2016-10-12 DIAGNOSIS — F329 Major depressive disorder, single episode, unspecified: Secondary | ICD-10-CM | POA: Diagnosis not present

## 2016-10-12 DIAGNOSIS — G4733 Obstructive sleep apnea (adult) (pediatric): Secondary | ICD-10-CM | POA: Diagnosis not present

## 2016-10-12 DIAGNOSIS — F4310 Post-traumatic stress disorder, unspecified: Secondary | ICD-10-CM | POA: Diagnosis not present

## 2016-10-12 DIAGNOSIS — M720 Palmar fascial fibromatosis [Dupuytren]: Secondary | ICD-10-CM | POA: Diagnosis not present

## 2016-10-12 DIAGNOSIS — M6281 Muscle weakness (generalized): Secondary | ICD-10-CM | POA: Diagnosis not present

## 2016-10-13 ENCOUNTER — Emergency Department (HOSPITAL_COMMUNITY): Payer: Medicare PPO

## 2016-10-13 ENCOUNTER — Emergency Department (HOSPITAL_COMMUNITY)
Admission: EM | Admit: 2016-10-13 | Discharge: 2016-10-13 | Disposition: A | Discharge status: Home / Self Care | Payer: Medicare PPO | Attending: Emergency Medicine | Admitting: Emergency Medicine

## 2016-10-13 DIAGNOSIS — I10 Essential (primary) hypertension: Secondary | ICD-10-CM | POA: Insufficient documentation

## 2016-10-13 DIAGNOSIS — Y9389 Activity, other specified: Secondary | ICD-10-CM | POA: Diagnosis not present

## 2016-10-13 DIAGNOSIS — Y92002 Bathroom of unspecified non-institutional (private) residence single-family (private) house as the place of occurrence of the external cause: Secondary | ICD-10-CM | POA: Insufficient documentation

## 2016-10-13 DIAGNOSIS — Z85828 Personal history of other malignant neoplasm of skin: Secondary | ICD-10-CM | POA: Diagnosis not present

## 2016-10-13 DIAGNOSIS — R791 Abnormal coagulation profile: Secondary | ICD-10-CM | POA: Insufficient documentation

## 2016-10-13 DIAGNOSIS — Z79899 Other long term (current) drug therapy: Secondary | ICD-10-CM | POA: Diagnosis not present

## 2016-10-13 DIAGNOSIS — G20 Parkinson's disease: Secondary | ICD-10-CM | POA: Diagnosis not present

## 2016-10-13 DIAGNOSIS — R51 Headache: Secondary | ICD-10-CM | POA: Diagnosis not present

## 2016-10-13 DIAGNOSIS — S0990XA Unspecified injury of head, initial encounter: Secondary | ICD-10-CM | POA: Diagnosis not present

## 2016-10-13 DIAGNOSIS — S299XXA Unspecified injury of thorax, initial encounter: Secondary | ICD-10-CM | POA: Diagnosis not present

## 2016-10-13 DIAGNOSIS — W19XXXA Unspecified fall, initial encounter: Secondary | ICD-10-CM | POA: Diagnosis not present

## 2016-10-13 DIAGNOSIS — W0110XA Fall on same level from slipping, tripping and stumbling with subsequent striking against unspecified object, initial encounter: Secondary | ICD-10-CM | POA: Insufficient documentation

## 2016-10-13 DIAGNOSIS — Y999 Unspecified external cause status: Secondary | ICD-10-CM | POA: Insufficient documentation

## 2016-10-13 DIAGNOSIS — R6889 Other general symptoms and signs: Secondary | ICD-10-CM | POA: Diagnosis not present

## 2016-10-13 DIAGNOSIS — R404 Transient alteration of awareness: Secondary | ICD-10-CM | POA: Diagnosis not present

## 2016-10-13 DIAGNOSIS — R251 Tremor, unspecified: Secondary | ICD-10-CM | POA: Diagnosis not present

## 2016-10-13 LAB — CBC WITH DIFFERENTIAL/PLATELET
Basophils Absolute: 0 10*3/uL (ref 0.0–0.1)
Basophils Relative: 1 %
EOS ABS: 0.2 10*3/uL (ref 0.0–0.7)
EOS PCT: 4 %
HCT: 42.3 % (ref 39.0–52.0)
Hemoglobin: 13.9 g/dL (ref 13.0–17.0)
LYMPHS ABS: 1.7 10*3/uL (ref 0.7–4.0)
LYMPHS PCT: 30 %
MCH: 30 pg (ref 26.0–34.0)
MCHC: 32.9 g/dL (ref 30.0–36.0)
MCV: 91.2 fL (ref 78.0–100.0)
MONO ABS: 0.4 10*3/uL (ref 0.1–1.0)
MONOS PCT: 7 %
Neutro Abs: 3.3 10*3/uL (ref 1.7–7.7)
Neutrophils Relative %: 58 %
PLATELETS: 175 10*3/uL (ref 150–400)
RBC: 4.64 MIL/uL (ref 4.22–5.81)
RDW: 13.9 % (ref 11.5–15.5)
WBC: 5.6 10*3/uL (ref 4.0–10.5)

## 2016-10-13 LAB — COMPREHENSIVE METABOLIC PANEL
ALT: 16 U/L — AB (ref 17–63)
ANION GAP: 5 (ref 5–15)
AST: 20 U/L (ref 15–41)
Albumin: 3.9 g/dL (ref 3.5–5.0)
Alkaline Phosphatase: 71 U/L (ref 38–126)
BUN: 21 mg/dL — ABNORMAL HIGH (ref 6–20)
CALCIUM: 8.9 mg/dL (ref 8.9–10.3)
CHLORIDE: 109 mmol/L (ref 101–111)
CO2: 26 mmol/L (ref 22–32)
CREATININE: 0.93 mg/dL (ref 0.61–1.24)
GFR calc Af Amer: >60 mL/min (ref >60)
Glucose, Bld: 102 mg/dL — ABNORMAL HIGH (ref 65–99)
Potassium: 4.3 mmol/L (ref 3.5–5.1)
SODIUM: 140 mmol/L (ref 135–145)
Total Bilirubin: 0.6 mg/dL (ref 0.3–1.2)
Total Protein: 6.6 g/dL (ref 6.5–8.1)

## 2016-10-13 LAB — URINALYSIS, ROUTINE W REFLEX MICROSCOPIC
BILIRUBIN URINE: NEGATIVE
Glucose, UA: NEGATIVE mg/dL
KETONES UR: NEGATIVE mg/dL
LEUKOCYTES UA: NEGATIVE
NITRITE: NEGATIVE
PROTEIN: NEGATIVE mg/dL
SPECIFIC GRAVITY, URINE: 1.021 (ref 1.005–1.030)
pH: 6 (ref 5.0–8.0)

## 2016-10-13 LAB — PROTIME-INR
INR: 1.04
PROTHROMBIN TIME: 13.7 s (ref 11.4–15.2)

## 2016-10-13 MED ORDER — SODIUM CHLORIDE 0.9 % IV BOLUS (SEPSIS)
1000.0000 mL | Freq: Once | INTRAVENOUS | Status: AC
  Administered 2016-10-13: 1000 mL via INTRAVENOUS

## 2016-10-13 NOTE — ED Triage Notes (Signed)
Patient had fall at home. Fell back and hit head. No LOC. Patient is alert. Wife states mental status not at baseline. Patient has hx of parkinson's and has DBS (deep brain stimulator). EMS states patient passed stroke eval. 112 cbg, 98%, 50 SB at baseline, 177/80 bp. 20 LAC. No hematoma, no laceration. Denies dizziness. Has not taken meds and has not eaten.

## 2016-10-13 NOTE — ED Provider Notes (Signed)
[facility] DEPT Provider Note   CSN: [id number] Arrival date & time: 10/13/16  1025     History   Chief Complaint Chief Complaint  Patient presents with  . Fall    HPI [name] is a 66 y.o. male.    The history is provided by the patient and the spouse.  Fall  This is a recurrent problem. The current episode started 1 to 2 hours ago. The problem occurs rarely. The problem has been resolved. Associated symptoms include headaches. Pertinent negatives include no chest pain, no abdominal pain and no shortness of breath. Nothing aggravates the symptoms. Nothing relieves the symptoms. He has tried nothing for the symptoms. The treatment provided no relief.    Past Medical History:  Diagnosis Date  . Arthritis   . Bradycardia   . Cancer [facility]) 2013   skin cancer  . Depression    ptsd  . Dysrhythmia    chronic slow heart rate  . GERD (gastroesophageal reflux disease)   . Headache(784.0)    tension headaches non recent  . History of chicken pox   . History of kidney stones   . Hypertension    treated with HCTZ  . Parkinson's disease ([place])    dx'ed 15 years ago  . PTSD (post-traumatic stress disorder)   . Shortness of breath dyspnea   . Sleep apnea    doesn't use C-pap  . Varicose veins     Patient Active Problem List   Diagnosis Date Noted  . Radicular pain in right arm 10/09/2016  . Rash 09/03/2016  . GERD (gastroesophageal reflux disease) 07/31/2016  . Trochanteric bursitis 03/03/2016  . Left hand pain 10/30/2015  . Fracture, finger, multiple sites 10/30/2015  . Colon cancer screening 07/23/2015  . Healthcare maintenance 07/23/2015  . Gout 02/18/2015  . Depression 01/06/2015  . Irritation of eyelid 08/20/2014  . Dupuytren's contracture 08/20/2014  . Cough 07/21/2014  . Lumbar stenosis with neurogenic claudication 06/13/2014  . Preop cardiovascular exam 05/28/2014  . S/P deep brain stimulator placement 05/08/2014  . PD (Parkinson's disease) ([place])  05/08/2014  . Joint pain 01/22/2014  . Medicare annual wellness visit, initial 01/19/2014  . Advance care planning 01/19/2014  . Parkinson's disease ([place]) 12/13/2013  . PTSD (post-traumatic stress disorder) 06/13/2013  . Erectile dysfunction 06/13/2013  . HLD (hyperlipidemia) 06/13/2013  . Hip pain 06/10/2013  . Pain in joint, shoulder region 06/10/2013  . Right leg swelling 06/03/2013  . Essential hypertension 06/03/2013  . Bradycardia by electrocardiogram 06/03/2013  . Obstructive sleep apnea 03/12/2013  . Spinal stenosis, lumbar region, with neurogenic claudication 12/14/2012    Past Surgical History:  Procedure Laterality Date  . CHOLECYSTECTOMY N/A 10/22/2014   Procedure: LAPAROSCOPIC CHOLECYSTECTOMY WITH INTRAOPERATIVE CHOLANGIOGRAM;  Surgeon: Dia Crawford III, MD;  Location: [facility] ORS;  Service: General;  Laterality: N/A;  . cyst removed      from lip as a child  . LUMBAR LAMINECTOMY/DECOMPRESSION MICRODISCECTOMY Bilateral 12/14/2012   Procedure: Bilateral lumbar three-four, four-five decompressive laminotomy/foraminotomy;  Surgeon: Charlie Pitter, MD;  Location: [facility] NEURO ORS;  Service: Neurosurgery;  Laterality: Bilateral;  . PULSE GENERATOR IMPLANT Bilateral 12/13/2013   Procedure: Bilateral implantable pulse generator placement;  Surgeon: Erline Levine, MD;  Location: [place] NE[facility] ORS;  Service: Neurosurgery;  Laterality: Bilateral;  Bilateral implantable pulse generator placement  . skin cancer removed     from ears,   12 lft arm  rt leg 15  . SUBTHALAMIC STIMULATOR INSERTION Bilateral 12/06/2013   Procedure:  SUBTHALAMIC STIMULATOR INSERTION;  Surgeon: Erline Levine, MD;  Location: [place] NEURO ORS;  Service: Neurosurgery;  Laterality: Bilateral;  Bilateral deep brain stimulator placement       Home Medications    Prior to Admission medications   Medication Sig Start Date End Date Taking? Authorizing Provider  Artificial Tear Ointment (DRY EYES OP) Apply to eye as needed.     [provider]  buPROPion (WELLBUTRIN XL) 150 MG 24 hr tablet Take 3 tablets (450 mg total) by mouth daily. 07/29/16   Tonia Ghent, MD  carbidopa-levodopa (SINEMET IR) 25-100 MG per tablet Take by mouth. 2 AM/ 1 afternoon/ 1 evening    [provider]  cholecalciferol (VITAMIN D) 1000 UNITS tablet Take 1,000 Units by mouth daily.    [provider]  diphenhydrAMINE (BENADRYL) 12.5 MG chewable tablet Chew 12.5 mg by mouth daily as needed for allergies.    [provider]  escitalopram (LEXAPRO) 10 MG tablet Take 1 tablet (10 mg total) by mouth daily. 07/29/16   Tonia Ghent, MD  naproxen (NAPROSYN) 500 MG tablet Take 500 mg by mouth 2 (two) times daily with a meal.    [provider]  omeprazole (PRILOSEC) 20 MG capsule Take 1 capsule (20 mg total) by mouth daily. 07/29/16   Tonia Ghent, MD  pravastatin (PRAVACHOL) 20 MG tablet Take 1 tablet (20 mg total) by mouth every evening. 06/06/16   Minna Merritts, MD    Family History Family History  Problem Relation Age of Onset  . Lung cancer Father   . Colon cancer Neg Hx   . Prostate cancer Neg Hx     Social History Social History  Substance Use Topics  . Smoking status: Never Smoker  . Smokeless tobacco: Former Systems developer  . Alcohol use 0.0 oz/week     Comment: occasional (twice per month)     Allergies   Klonopin [clonazepam]; Morphine and related; and Prednisone   Review of Systems Review of Systems  Constitutional: Negative for activity change, chills, diaphoresis, fatigue and fever.  HENT: Negative for congestion and rhinorrhea.   Eyes: Negative for visual disturbance.  Respiratory: Negative for cough, chest tightness, shortness of breath, wheezing and stridor.   Cardiovascular: Negative for chest pain, palpitations and leg swelling.  Gastrointestinal: Negative for abdominal distention, abdominal pain, blood in stool, constipation, diarrhea, nausea and vomiting.  Genitourinary:  Negative for difficulty urinating, dysuria, flank pain and frequency.  Musculoskeletal: Negative for back pain, gait problem, neck pain and neck stiffness.  Skin: Negative for rash and wound.  Neurological: Positive for tremors and headaches. Negative for dizziness, weakness, light-headedness and numbness.  Psychiatric/Behavioral: Negative for agitation.  All other systems reviewed and are negative.    Physical Exam Updated Vital Signs BP (!) 143/81   Pulse (!) 46   Resp 15   Ht 5\' 9"  (1.753 m)   Wt 81.2 kg (179 lb)   SpO2 99%   BMI 26.43 kg/m   Physical Exam  Constitutional: He appears well-developed and well-nourished. No distress.  HENT:  Head: Head is without abrasion, without contusion and without laceration.    Right Ear: External ear normal.  Left Ear: External ear normal.  Nose: Nose normal.  Mouth/Throat: Oropharynx is clear and moist. No oropharyngeal exudate.  Eyes: Conjunctivae and EOM are normal. Pupils are equal, round, and reactive to light.  Neck: Normal range of motion. Neck supple.  Cardiovascular: Normal rate, normal heart sounds and intact distal pulses.  No murmur heard. Pulmonary/Chest: Effort normal and breath sounds normal. No stridor. No respiratory distress. He has no wheezes. He exhibits no tenderness.  Abdominal: Soft. There is no tenderness. There is no rebound and no guarding.  Musculoskeletal: He exhibits no edema or tenderness.  Neurological: He is alert. He has normal strength. He is disoriented. He displays tremor. He displays normal reflexes. No cranial nerve deficit or sensory deficit. He exhibits abnormal muscle tone (tremor). He displays no seizure activity. Coordination normal. GCS eye subscore is 4. GCS verbal subscore is 5. GCS motor subscore is 6.  Resting tremor   Skin: Skin is warm. No rash noted. He is not diaphoretic. No erythema.     ED Treatments / Results  Labs (all labs ordered are listed, but only abnormal results are  displayed) Labs Reviewed  COMPREHENSIVE METABOLIC PANEL - Abnormal; Notable for the following:       Result Value   Glucose, Bld 102 (*)    BUN 21 (*)    ALT 16 (*)    All other components within normal limits  URINALYSIS, ROUTINE W REFLEX MICROSCOPIC - Abnormal; Notable for the following:    APPearance HAZY (*)    Hgb urine dipstick MODERATE (*)    Bacteria, UA FEW (*)    Squamous Epithelial / LPF 0-5 (*)    All other components within normal limits  CBC WITH DIFFERENTIAL/PLATELET  PROTIME-INR    EKG  EKG Interpretation None       Radiology Dg Chest 2 View  Result Date: 10/13/2016 CLINICAL DATA:  66 year old male status post fall this morning hitting head at home. EXAM: CHEST  2 VIEW COMPARISON:  09/14/2016 and earlier. FINDINGS: Semi upright AP and lateral views of the chest. Bilateral chest brain stimulator devices are in place. Lung volumes are stable and within normal limits. The lungs remain clear. No pneumothorax or pleural effusion. Mediastinal contours are stable and within normal limits. Visualized tracheal air column is within normal limits. Negative visible bowel gas pattern. No acute osseous abnormality identified. IMPRESSION: No acute cardiopulmonary abnormality or acute traumatic injury identified. Electronically Signed   By: Genevie Ann M.D.   On: 10/13/2016 12:17   Ct Head Wo Contrast  Result Date: 10/13/2016 CLINICAL DATA:  66 year old male with history of trauma from a fall with injury to the back of the head. Confusion. History of Parkinson's disease. EXAM: CT HEAD WITHOUT CONTRAST TECHNIQUE: Contiguous axial images were obtained from the base of the skull through the vertex without intravenous contrast. COMPARISON:  Head CT 11/28/2013. FINDINGS: Brain: Bilateral deep brain stimulators are noted. No evidence of acute infarction, hemorrhage, hydrocephalus, extra-axial collection or mass lesion/mass effect. Vascular: No hyperdense vessel or unexpected calcification.  Skull: Normal. Negative for fracture or focal lesion. Sinuses/Orbits: No acute finding. Other: None. IMPRESSION: 1. No evidence of significant acute traumatic injury to the skull or brain. 2. Bilateral deep brain stimulators in place. The appearance of the brain is otherwise unremarkable. Electronically Signed   By: Vinnie Langton M.D.   On: 10/13/2016 12:11    Procedures Procedures (including critical care time)  Medications Ordered in ED Medications  sodium chloride 0.9 % bolus 1,000 mL (0 mLs Intravenous Stopped 10/13/16 1614)     Initial Impression / Assessment and Plan / ED Course  I have reviewed the triage vital signs and the nursing notes.  Pertinent labs & imaging results that were available during my care of the patient were reviewed by me and considered in  my medical decision making (see chart for details).     KASH DAVIE is a 66 y.o. male  with a past medical history significant for hypertension, hyperlipidemia, Parkinson's disease status post deep brain stimulator, gout, and chronic bradycardia who presents with a fall. Patient is accompanied by family reports that patient had a mechanical fall today while trying to get into the bathroom not using his walker. Patient feels that he got tripped up and fell hitting his head on the ground. He denies loss of consciousness but his arm over the time really following a fall. Family says that he was "out of it" and was slightly disoriented for several minutes. Patient reports that he did not lose consciousness. Patient denies loss of control of bowel or bladder. He denies any other complaints including no vision changes, nausea, vomiting, neck pain, or coordination problems from his baseline. Patient does report a mild posterior occipital headache. He denies other complaints.  Family denies any preceding symptoms such as fevers, chills, chest pain, shortness breath, nausea and vomiting, constipation, diarrhea, dysuria.  History and  exam are seen above. On exam, patient had milder tenderness on the left occipital area. Patient had no bruising or contusion. No lacerations. Patient's neurologic exam was grossly unremarkable with a slight tremor. Patient's DBS stimulator was on making his EKG unreliable. Patient had a heart rate measured on pulse ox and with annual palpation which was within his normal bradycardic range. Patient denies any chest pain or palpitations.  Patient had imaging to look for acute medication injuries. CT imaging was reassuring. Patient's laboratory testing was also reassuring. No evidence of pneumonia on chest x-ray and urinalysis did not show UTI.  Patient was observed for a period time without any further symptoms. Suspect concussion as cause of patient's mild headache.  Patient will follow up with his PCP for further management of his Parkinson's. Patient and family understood plan of care and management for concussion. Patient understood return precautions for new or worsening symptoms. Patient discharged in good condition.    Final Clinical Impressions(s) / ED Diagnoses   Final diagnoses:  Fall, initial encounter    New Prescriptions Discharge Medication List as of 10/13/2016  3:54 PM     . Clinical Impression: 1. Fall, initial encounter     Disposition: Discharge  Condition: Good  I have discussed the results, Dx and Tx plan with the pt(& family if present). He/she/they expressed understanding and agree(s) with the plan. Discharge instructions discussed at great length. Strict return precautions discussed and pt &/or family have verbalized understanding of the instructions. No further questions at time of discharge.    Discharge Medication List as of 10/13/2016  3:54 PM      Follow Up: Tonia Ghent, MD [place] [address] [phone]  Schedule an appointment as soon as possible for a visit    [facility] [address] [id number] [place] [phone]  If symptoms worsen     Tegeler, Gwenyth Allegra, MD 10/14/16 0830

## 2016-10-13 NOTE — Discharge Instructions (Signed)
Please follow-up with your PCP for further management of your falls. Please try to make your home safe to prevent falls. Your workup today did not show evidence of acute injuries, we suspect you have a concussion. If any symptoms change or worsen, please return to the nearest Emergency Department. Marland Kitchen

## 2016-10-13 NOTE — ED Notes (Signed)
Pt aware of need for urine  Pt transported to x-ray

## 2016-10-14 ENCOUNTER — Telehealth: Payer: Self-pay | Admitting: *Deleted

## 2016-10-14 ENCOUNTER — Telehealth: Payer: Self-pay

## 2016-10-14 NOTE — Telephone Encounter (Signed)
[name] speech therapist left vm. Pt fell yesterday and was taken to the ER and was dx with a concussion he was not admitted, wife requested no home speech therapy for today. fyi

## 2016-10-14 NOTE — Telephone Encounter (Signed)
Noted. Thanks.

## 2016-10-14 NOTE — Telephone Encounter (Signed)
jatana with [facility] states that pt fell 6/28 and was seen at the ED and Dx with a concussion. She is requesting PT be extended 1/wk for 3wks to f/u on pts fall and ensure he is still at his functioning baseline. Verbal order can be left on vm

## 2016-10-14 NOTE — Telephone Encounter (Signed)
Please give the order.  Thanks.   

## 2016-10-14 NOTE — Telephone Encounter (Signed)
Left detailed message on voicemail of [place], [place].

## 2016-10-17 ENCOUNTER — Encounter: Payer: Self-pay | Admitting: Physical Therapy

## 2016-10-17 ENCOUNTER — Emergency Department
Admission: EM | Admit: 2016-10-17 | Discharge: 2016-10-17 | Disposition: A | Discharge status: Home / Self Care | Payer: Medicare PPO | Attending: Emergency Medicine | Admitting: Emergency Medicine

## 2016-10-17 ENCOUNTER — Emergency Department: Payer: Medicare PPO

## 2016-10-17 ENCOUNTER — Encounter: Payer: Self-pay | Admitting: Emergency Medicine

## 2016-10-17 DIAGNOSIS — Z85828 Personal history of other malignant neoplasm of skin: Secondary | ICD-10-CM | POA: Insufficient documentation

## 2016-10-17 DIAGNOSIS — G20 Parkinson's disease: Secondary | ICD-10-CM | POA: Diagnosis not present

## 2016-10-17 DIAGNOSIS — Z7902 Long term (current) use of antithrombotics/antiplatelets: Secondary | ICD-10-CM | POA: Insufficient documentation

## 2016-10-17 DIAGNOSIS — I10 Essential (primary) hypertension: Secondary | ICD-10-CM | POA: Insufficient documentation

## 2016-10-17 DIAGNOSIS — W1830XA Fall on same level, unspecified, initial encounter: Secondary | ICD-10-CM | POA: Insufficient documentation

## 2016-10-17 DIAGNOSIS — S199XXA Unspecified injury of neck, initial encounter: Secondary | ICD-10-CM | POA: Diagnosis not present

## 2016-10-17 DIAGNOSIS — M48062 Spinal stenosis, lumbar region with neurogenic claudication: Secondary | ICD-10-CM | POA: Diagnosis not present

## 2016-10-17 DIAGNOSIS — Z87891 Personal history of nicotine dependence: Secondary | ICD-10-CM | POA: Insufficient documentation

## 2016-10-17 DIAGNOSIS — Y929 Unspecified place or not applicable: Secondary | ICD-10-CM | POA: Diagnosis not present

## 2016-10-17 DIAGNOSIS — Z79899 Other long term (current) drug therapy: Secondary | ICD-10-CM | POA: Diagnosis not present

## 2016-10-17 DIAGNOSIS — F4310 Post-traumatic stress disorder, unspecified: Secondary | ICD-10-CM | POA: Diagnosis not present

## 2016-10-17 DIAGNOSIS — M6281 Muscle weakness (generalized): Secondary | ICD-10-CM | POA: Diagnosis not present

## 2016-10-17 DIAGNOSIS — F329 Major depressive disorder, single episode, unspecified: Secondary | ICD-10-CM | POA: Diagnosis not present

## 2016-10-17 DIAGNOSIS — Y998 Other external cause status: Secondary | ICD-10-CM | POA: Insufficient documentation

## 2016-10-17 DIAGNOSIS — S0990XA Unspecified injury of head, initial encounter: Secondary | ICD-10-CM | POA: Diagnosis not present

## 2016-10-17 DIAGNOSIS — M109 Gout, unspecified: Secondary | ICD-10-CM | POA: Diagnosis not present

## 2016-10-17 DIAGNOSIS — G4733 Obstructive sleep apnea (adult) (pediatric): Secondary | ICD-10-CM | POA: Diagnosis not present

## 2016-10-17 DIAGNOSIS — Z9181 History of falling: Secondary | ICD-10-CM | POA: Diagnosis not present

## 2016-10-17 DIAGNOSIS — Y9389 Activity, other specified: Secondary | ICD-10-CM | POA: Diagnosis not present

## 2016-10-17 DIAGNOSIS — W19XXXA Unspecified fall, initial encounter: Secondary | ICD-10-CM

## 2016-10-17 DIAGNOSIS — M720 Palmar fascial fibromatosis [Dupuytren]: Secondary | ICD-10-CM | POA: Diagnosis not present

## 2016-10-17 NOTE — ED Notes (Signed)
This RN and MD to bedside at this time.

## 2016-10-17 NOTE — ED Notes (Signed)
Pt returned from CT at this time.  

## 2016-10-17 NOTE — ED Notes (Signed)
NAD noted at time of D/C. Pt and wife deny questions or concerns. Pt taken to the lobby via wheelchair at this time.

## 2016-10-17 NOTE — ED Provider Notes (Signed)
[facility] Emergency Department Provider Note  ____________________________________________   I have reviewed the triage vital signs and the nursing notes.   HISTORY  Chief Complaint Fall    HPI [name] is a 66 y.o. male who fell, today, at home, bumped his head. He does not have a significant headache. Did not pass out. No surgical fall. Fall 3 or 4 times a week at least. Very usual for him with his Parkinson's. Is compliant with his medications. There is no history of vomiting or significant concussive symptoms. He is at his baseline per family. Patient does have a normal bradycardia which is unchanged. He has no significant pain, he did bump his head. The accident happened when he was getting out of a chair and he lost his footing and fell back popping his head against a door handle. He denies any significant neck pain. No bleeding. The family brought him in because he did have a recent event with this where he had hit his head anyone make sure that he is okay despite having repeated head injuries. Usually he is able to fall without a significant trauma. Patient has no hip pain or other injury, he is at his baseline otherwise. He has PT and OT become by the house he recently redid the house and they are adamant that they wish to keep him in the house. Baseline heart rate is 40.,      Past Medical History:  Diagnosis Date  . Arthritis   . Bradycardia   . Cancer [facility]) 2013   skin cancer  . Depression    ptsd  . Dysrhythmia    chronic slow heart rate  . GERD (gastroesophageal reflux disease)   . Headache(784.0)    tension headaches non recent  . History of chicken pox   . History of kidney stones   . Hypertension    treated with HCTZ  . Parkinson's disease ([place])    dx'ed 15 years ago  . PTSD (post-traumatic stress disorder)   . Shortness of breath dyspnea   . Sleep apnea    doesn't use C-pap  . Varicose veins     Patient Active Problem  List   Diagnosis Date Noted  . Radicular pain in right arm 10/09/2016  . Rash 09/03/2016  . GERD (gastroesophageal reflux disease) 07/31/2016  . Trochanteric bursitis 03/03/2016  . Left hand pain 10/30/2015  . Fracture, finger, multiple sites 10/30/2015  . Colon cancer screening 07/23/2015  . Healthcare maintenance 07/23/2015  . Gout 02/18/2015  . Depression 01/06/2015  . Irritation of eyelid 08/20/2014  . Dupuytren's contracture 08/20/2014  . Cough 07/21/2014  . Lumbar stenosis with neurogenic claudication 06/13/2014  . Preop cardiovascular exam 05/28/2014  . S/P deep brain stimulator placement 05/08/2014  . PD (Parkinson's disease) ([place]) 05/08/2014  . Joint pain 01/22/2014  . Medicare annual wellness visit, initial 01/19/2014  . Advance care planning 01/19/2014  . Parkinson's disease ([place]) 12/13/2013  . PTSD (post-traumatic stress disorder) 06/13/2013  . Erectile dysfunction 06/13/2013  . HLD (hyperlipidemia) 06/13/2013  . Hip pain 06/10/2013  . Pain in joint, shoulder region 06/10/2013  . Right leg swelling 06/03/2013  . Essential hypertension 06/03/2013  . Bradycardia by electrocardiogram 06/03/2013  . Obstructive sleep apnea 03/12/2013  . Spinal stenosis, lumbar region, with neurogenic claudication 12/14/2012    Past Surgical History:  Procedure Laterality Date  . CHOLECYSTECTOMY N/A 10/22/2014   Procedure: LAPAROSCOPIC CHOLECYSTECTOMY WITH INTRAOPERATIVE CHOLANGIOGRAM;  Surgeon: Dia Crawford III, MD;  Location: [facility] ORS;  Service: General;  Laterality: N/A;  . cyst removed      from lip as a child  . LUMBAR LAMINECTOMY/DECOMPRESSION MICRODISCECTOMY Bilateral 12/14/2012   Procedure: Bilateral lumbar three-four, four-five decompressive laminotomy/foraminotomy;  Surgeon: Charlie Pitter, MD;  Location: [place] NEURO ORS;  Service: Neurosurgery;  Laterality: Bilateral;  . PULSE GENERATOR IMPLANT Bilateral 12/13/2013   Procedure: Bilateral implantable pulse generator placement;   Surgeon: Erline Levine, MD;  Location: [place] NEURO ORS;  Service: Neurosurgery;  Laterality: Bilateral;  Bilateral implantable pulse generator placement  . skin cancer removed     from ears,   12 lft arm  rt leg 15  . SUBTHALAMIC STIMULATOR INSERTION Bilateral 12/06/2013   Procedure: SUBTHALAMIC STIMULATOR INSERTION;  Surgeon: Erline Levine, MD;  Location: [place] NEURO ORS;  Service: Neurosurgery;  Laterality: Bilateral;  Bilateral deep brain stimulator placement    Prior to Admission medications   Medication Sig Start Date End Date Taking? Authorizing Provider  buPROPion (WELLBUTRIN XL) 150 MG 24 hr tablet Take 3 tablets (450 mg total) by mouth daily. 07/29/16  Yes Tonia Ghent, MD  carbidopa-levodopa (SINEMET IR) 25-100 MG per tablet Take by mouth. 2 AM/ 1 afternoon/ 1 evening   Yes [provider]  cholecalciferol (VITAMIN D) 1000 UNITS tablet Take 1,000 Units by mouth daily.   Yes [provider]  escitalopram (LEXAPRO) 10 MG tablet Take 1 tablet (10 mg total) by mouth daily. 07/29/16  Yes Tonia Ghent, MD  hydrOXYzine (ATARAX/VISTARIL) 10 MG tablet Take 10 mg by mouth at bedtime. 09/27/16  Yes [provider]  naproxen (NAPROSYN) 500 MG tablet Take 500 mg by mouth 2 (two) times daily with a meal.   Yes [provider]  omeprazole (PRILOSEC) 20 MG capsule Take 1 capsule (20 mg total) by mouth daily. 07/29/16  Yes Tonia Ghent, MD  pravastatin (PRAVACHOL) 20 MG tablet Take 1 tablet (20 mg total) by mouth every evening. 06/06/16  Yes Gollan, Kathlene November, MD  NONFORMULARY OR COMPOUNDED ITEM Apply 1 application topically daily. Triamcinolone .25%/Cerave 1:1    [provider]    Allergies Klonopin [clonazepam]; Morphine and related; and Prednisone  Family History  Problem Relation Age of Onset  . Lung cancer Father   . Colon cancer Neg Hx   . Prostate cancer Neg Hx     Social History Social History  Substance Use Topics  . Smoking status: Never  Smoker  . Smokeless tobacco: Former Systems developer  . Alcohol use 0.0 oz/week     Comment: occasional (twice per month)    Review of Systems Constitutional: No fever/chills Eyes: No visual changes. ENT: No sore throat. No stiff neck no neck pain Cardiovascular: Denies chest pain. Respiratory: Denies shortness of breath. Gastrointestinal:   no vomiting.  No diarrhea.  No constipation. Genitourinary: Negative for dysuria. Musculoskeletal: Negative lower extremity swelling Skin: Negative for rash. Neurological: Negative for severe headaches, focal weakness or numbness.   ____________________________________________   PHYSICAL EXAM:  VITAL SIGNS: ED Triage Vitals [10/17/16 1319]  Enc Vitals Group     BP 136/77     Pulse Rate (!) 47     Resp 18     Temp 98.2 F (36.8 C)     Temp Source Oral     SpO2 97 %     Weight      Height      Head Circumference      Peak Flow      Pain  Score      Pain Loc      Pain Edu?      Excl. in [place]?     Constitutional: Alert and oriented. Well appearing and in no acute distress. Eyes: Conjunctivae are normal Head: There is no skull fracture palpated there is no significant bruising there is minimal tenderness to palpation in the occiput HEENT: No congestion/rhinnorhea. Mucous membranes are moist.  Oropharynx non-erythematous Neck:   Slight paraspinal tenderness in the C6 region with no significant midline tenderness with no meningismus, no masses, no stridor Cardiovascular: Normal rate, regular rhythm. Grossly normal heart sounds.  Good peripheral circulation. Respiratory: Normal respiratory effort.  No retractions. Lungs CTAB. Abdominal: Soft and nontender. No distention. No guarding no rebound Back:  There is no focal tenderness or step off.  there is no midline tenderness there are no lesions noted. there is no CVA tenderness Musculoskeletal: No lower extremity tenderness, no upper extremity tenderness. No joint effusions, no DVT signs strong  distal pulses no edema Neurologic:  Normal speech and language. No gross focal neurologic deficits are appreciated.  Skin:  Skin is warm, dry and intact. No rash noted. Psychiatric: Mood and affect are normal. Speech and behavior are normal.  ____________________________________________   LABS (all labs ordered are listed, but only abnormal results are displayed)  Labs Reviewed - No data to display ____________________________________________  EKG  I personally interpreted any EKGs ordered by me or triage  ____________________________________________  RADIOLOGY  I reviewed any imaging ordered by me or triage that were performed during my shift and, if possible, patient and/or family made aware of any abnormal findings. ____________________________________________   PROCEDURES  Procedure(s) performed: None  Procedures  Critical Care performed: None  ____________________________________________   INITIAL IMPRESSION / ASSESSMENT AND PLAN / ED COURSE  Pertinent labs & imaging results that were available during my care of the patient were reviewed by me and considered in my medical decision making (see chart for details).  Patient here with a closed head injury injury after a non-syncopal fall. I did offer blood work but family states that aside again and they don't want done again. He is bradycardic but he always his sons is well documented. He has no focal neurologic deficits he does have a generalized tremor from Parkinson's. His family did not wish me to help him with placement, he is dedicated stay at home they have multiple different assistance helping him at home including PT/OT, they've redone the house to keep him there, they're with him all the time. He does walk with a walker he just fell before he gets to his walker. No obvious injury noted today. He has full painless range of motion of his hips etc. I have offered social work consult and they declined and I have  offered blood work and urine and they declined. This is not unreasonable. We will have him follow closely with primary care, family very kept with this plan is we'll make sure he wasn't bleeding into his brain. He is not anticoagulated. Return precautions and follow-up given and understood.    ____________________________________________   FINAL CLINICAL IMPRESSION(S) / ED DIAGNOSES  Final diagnoses:  None      This chart was dictated using voice recognition software.  Despite best efforts to proofread,  errors can occur which can change meaning.      Schuyler Amor, MD 10/17/16 [phone]

## 2016-10-17 NOTE — Discharge Instructions (Signed)
You have declined further workup including blood work etc. This is not unreasonable but does limit our ability to further assess you. If there are any new or worrisome symptoms please return to the emergency department. Follow closely with primary care doctor

## 2016-10-17 NOTE — Therapy (Signed)
[facility] [address] [place], [place], [address] Phone: [phone]   Fax:  [phone]  Patient Details  Name: [name] MRN: [id number] Date of Birth: Jul 29, 1950 Referring Provider:  No ref. provider found  Encounter Date: 10/17/2016  PHYSICAL THERAPY DISCHARGE SUMMARY  Visits from Start of Care: 10  Current functional level related to goals / functional outcomes:     PT Short Term Goals - 06/07/16 0941      PT SHORT TERM GOAL #1   Title Pt will perform HEP with family's supervision, for improved balance, transfers, and gait.  TARGET 06/08/16   Baseline Pt has been instructed in HEP for lower extremity stretches, and appears to be performing.   Time 4   Period Weeks   Status Achieved     PT SHORT TERM GOAL #2   Title Pt will perform 5x sit<>stand in less than or equal to 15 seconds for improved efficiency and safety with transfers.   Baseline 5x sit<>stand 18.75 sec- 06/07/16   Time 4   Status Not Met     PT SHORT TERM GOAL #3   Title Pt will improve TUG score to less than or equal to 13.5 seconds for decreased fall risk.   Baseline TUG with rollator25.87 sec 06/07/16   Time 4   Period Weeks   Status Not Met     PT SHORT TERM GOAL #4   Title Pt will perform floor>stand transfer with min assist of family member for improved fall recovery.   Baseline min guard assistance iwth UE support 06/01/16 visit   Time 4   Period Weeks   Status Achieved     PT SHORT TERM GOAL #5   Title Pt will verbalize at least 3 means to decrease pain along R lateral thigh/iliotibial band.   Time 4   Period Weeks   Status On-going         PT Long Term Goals - 06/23/16 1400      PT LONG TERM GOAL #1   Title Pt/family will verabalize understanding of fall prevention/tips to reduce freezing with gait in the home environment.  TARGET 07/07/16   Time 1  per recert 09/24/60   Period Weeks   Status On-going     PT LONG  TERM GOAL #2   Title Pt will improve gait velocity to at least 2.62 ft/sec for improved community ambulator status (with appropriate assistive device).   Baseline gait velocity 1.62 ft/sec 06/23/16   Time 6   Period Weeks   Status Not Met     PT LONG TERM GOAL #3   Title Pt will report at least 25% improvement in performance of low surface transfers for improved ability to get in and out of car as well as restaraunt seating.    Time 1  per recert 12/22/26   Period Weeks   Status On-going     PT LONG TERM GOAL #4   Title Pt/wife will verbalize understanding of plans for continued community fitness upon D/C from PT.   Time 1  per recert 07/18/30   Period Weeks   Status On-going     PT LONG TERM GOAL #5   Title Pt will demonstrate proper use and technique of U-step RW for improved household and community gait.   Baseline Pt declined outdoor gait with U-step this visit upon PT request   Time 1  per recert 07/21/99   Period Weeks   Status On-going  Remaining deficits: Posture, balance, recurrent falls, decreased safety awareness   Education / Equipment: Pt/family educated in HEP, fall prevention, community fitness options.  Pt cancelled last PT visit, and was discharged from PT. Plan: Patient agrees to discharge.  Patient goals were partially met. Patient is being discharged due to                                                     ?????maximizing rehab potential at this time.  Recommend PT eval/screen in 6 months due to progressive nature of disease process.       Khaylee Mcevoy W. 10/17/2016, 8:30 AM  [facility] [address] [place], [place], [address] Phone: [phone]   Fax:  [phone]

## 2016-10-17 NOTE — ED Notes (Signed)
Pt taken to CT at this time.

## 2016-10-17 NOTE — ED Notes (Signed)
Pt in gown and placed on cardiac monitor.

## 2016-10-17 NOTE — ED Notes (Signed)
This RN to bedside, apologized for delay in getting patient discharged. Pt and wife state understanding at this time. Pt visualized in NAD at this time. Will continue to monitor for further patient needs.

## 2016-10-17 NOTE — ED Triage Notes (Signed)
Pt to ed with c/o falling today while trying to stand, pt states he fells backwards hitting his head on a door.  Pt denies loc. Hx of parkinsons.

## 2016-10-18 NOTE — Progress Notes (Signed)
NEUROPSYCHOLOGICAL EVALUATION   Name:    [name]  Date of Birth:   1950-09-15 Date of Interview:  09/29/2016 Date of Testing:  09/29/2016   Date of Feedback:  10/20/2016       Background Information:  Reason for Referral:  [name] is a 66 y.o. male referred by Dr. Wells Guiles Tat to assess his current level of cognitive functioning and assist in differential diagnosis. The current evaluation consisted of a review of available medical records, an interview with the patient, and the completion of a neuropsychological testing battery. Informed consent was obtained.  History of Presenting Problem [09/29/2016]:  [name] reportedly was diagnosed with PD in 2000. He has been followed by Dr. Carles Collet since early 2015. He underwent bilateral STN DBS on 12/06/2013 with generator placement on 12/13/2013. He went back on his previous dose of medication not long after the procedure but did notice improved PD symptoms. He had a neuropsychological evaluation with Dr. Nathanial Rancher at [facility] in April 2015, prior to the DBS procedure. The majority of his cognitive test performances at that time were within normal limits with the exception of one test of novel problem-solving and fine motor speed. He met criteria for a diagnosis of cognitive disorder due to a general medical condition in the mild range of severity. It was Dr. Jenne Campus opinion that the patient's mild executive dysfunction was related primarily to PD with an exacerbation due to untreated obstructive sleep apnea. There were no indications that the patient was experiencing the early stages of Alzheimer's disease or Parkinson's disease dementia. He also met criteria for a diagnosis of adjustment disorder with depressed and anxious mood likely as a result of biological and psychosocial mechanisms related to PD.   At today's visit, the patient reports a decline in cognitive functioning since his last evaluation in 2015. He  reports that he had a very high IQ in the past and he feels it is "not that way" anymore. Upon direct questioning, the patient reported forgetfulness for recent conversations/things he has been told, frequently misplacing or losing items, difficulty concentrating, slow information processing speed, word finding difficulty, and forgetting what he is intending to say mid-sentence. He denied any trouble with navigation or visual-spatial skills although he noted he did stop driving 3-4 weeks ago. His wife manages all complex ADLs including medications, finances/bills, appointments and cooking.  He complains of significant balance problems with frequent falls. He states he falls even when using his walker. He has had to start using a wheelchair more often. He hurt his right shoulder in a recent fall. He does not think he has hit his head in any falls or lost consciousness.   He has a history of excessive daytime sleepiness. He reports sleep difficulties characterized by trouble staying asleep at night. He falls asleep very easily. He endorses visual hallucinations only when he is in bed at night. He states he sees "apparitions". They do not upset or bother him. He tried Nuplazid in the past but it apparently was not helpful. He reports ongoing difficulty with REM sleep behavior symptoms including movement and acting out dreams in his sleep. His wife frequently has to leave the bed because he accidentally hits her.  He endorses depressed mood, primarily related to being unable to do outdoor activities he used to enjoy. He spends much of his time watching TV. He endorses increased irritability. He reported reduced appetite and weight loss. He endorsed passive suicidal ideation (wishing he  was no longer alive) but denied suicidal intention or planning.   Social History: Born/Raised: [place], [place] Education: Associate's degree in business Occupational history: Served in [employer] from 1969-2002. 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 Reportedly married five times in total, with three children Alcohol: Occasional Tobacco: Never a smoker; former smokeless tobacco user. SA: Denied   Medical History:  Past Medical History:  Diagnosis Date  . Arthritis   . Bradycardia   . Cancer [facility]) 2013   skin cancer  . Depression    ptsd  . Dysrhythmia    chronic slow heart rate  . GERD (gastroesophageal reflux disease)   . Headache(784.0)    tension headaches non recent  . History of chicken pox   . History of kidney stones   . Hypertension    treated with HCTZ  . Parkinson's disease ([place])    dx'ed 15 years ago  . PTSD (post-traumatic stress disorder)   . Shortness of breath dyspnea   . Sleep apnea    doesn't use C-pap  . Varicose veins     Current medications:  Outpatient Encounter Prescriptions as of 10/20/2016  Medication Sig  . buPROPion (WELLBUTRIN XL) 150 MG 24 hr tablet Take 3 tablets (450 mg total) by mouth daily.  . carbidopa-levodopa (SINEMET IR) 25-100 MG per tablet Take by mouth. 2 AM/ 1 afternoon/ 1 evening  . cholecalciferol (VITAMIN D) 1000 UNITS tablet Take 1,000 Units by mouth daily.  Marland Kitchen escitalopram (LEXAPRO) 10 MG tablet Take 1 tablet (10 mg total) by mouth daily.  . hydrOXYzine (ATARAX/VISTARIL) 10 MG tablet Take 10 mg by mouth at bedtime.  . naproxen (NAPROSYN) 500 MG tablet Take 500 mg by mouth 2 (two) times daily with a meal.  . NONFORMULARY OR COMPOUNDED ITEM Apply 1 application topically daily. Triamcinolone .25%/Cerave 1:1  . omeprazole (PRILOSEC) 20 MG capsule Take 1 capsule (20 mg total) by mouth daily.  . pravastatin (PRAVACHOL) 20 MG tablet Take 1 tablet (20 mg total) by mouth every evening.   No facility-administered encounter medications on file as of 10/20/2016.      Current Examination:  Behavioral Observations:   Appearance: Casually and appropriately dressed  and groomed. Mild UE tremor observed. Gait: Ambulated with walker, significant imbalance (nearly fell several times), +foot drag Speech: Dysarthric, reduced volume, coughs frequently Thought process: Appears linear Affect: Blunted, smiles appropriately occasionally Interpersonal: Pleasant, appropriate Orientation: Oriented to person and place. Disoriented to time (including month, year and day of the week). Accurately named the current President and his predecessor.   Tests Administered: . Test of Premorbid Functioning (TOPF) . Wechsler Adult Intelligence Scale-Fourth Edition (WAIS-IV): Similarities, Music therapist, Coding and Digit Span subtests . Wechsler Memory Scale-Fourth Edition (WMS-IV) Older Adult Version (ages 70-90): Logical Memory I, II and Recognition subtests  . [employer] Verbal Learning Test - 2nd Edition (CVLT-2) Short Form . Repeatable Battery for the Assessment of Neuropsychological Status (RBANS) Form A:  Figure Copy and Recall subtests and Semantic Fluency subtest . Boston Naming Test (BNT) . Boston Diagnostic Aphasia Examination: Complex Ideational Material subtest . Controlled Oral Word Association Test (COWAT) . Trail Making Test A and B . Clock drawing test . Geriatric Depression Scale (GDS) 15 Item . Generalized Anxiety Disorder - 7 item screener (GAD-7) . Parkinson's Disease Questionnaire (PDQ-39)  Test Results: Note: Standardized scores are presented only for use by appropriately trained professionals and to allow for any future  test-retest comparison. These scores should not be interpreted without consideration of all the information that is contained in the rest of the report. The most recent standardization samples from the test publisher or other sources were used whenever possible to derive standard scores; scores were corrected for age, gender, ethnicity and education when available.   Test Scores:  Test Name Raw Score Standardized Score Descriptor  TOPF  48/70 SS= 105 Average  WAIS-IV Subtests     Similarities 15/36 ss= 6 Low average  Block Design 12/66 ss= 4 Impaired  Coding 6/135 ss= 1 Impaired  Digit Span Forward 12/16 ss= 13 High average  Digit Span Backward 8/16 ss= 10 Average  WMS-IV Subtests     LM I 10/53 ss= 2 Impaired  LM II 9/39 ss= 6 Low average  LM II Recognition 16/23 Cum %: 10-16 Below average  RBANS Subtests     Figure Copy 17/20 Z= -0.7 Average  Figure Recall 5/20 Z= -2.2 Impaired  Semantic Fluency 5/40 Z= -3.5 Severely impaired  CVLT-II Scores     Trial 1 3/9 Z= -3 Severely impaired  Trial 4 6/9 Z= -1 Low average  Trials 1-4 total 18/36 T= 34 Borderline  SD Free Recall 2/9 Z= -2.5 Impaired  LD Free Recall 2/9 Z= -1.5 Borderline  LD Cued Recall 3/9 Z= -2 Impaired  Recognition Discriminability 8/9 hits, 4 false positives Z= -0.5 Average  Forced Choice Recognition 9/9  WNL  BNT 59/60 T= 60 High average  BDAE Complex Ideational Material 12/12  WNL  COWAT-FAS 8 T= 22 Severely impaired  COWAT-Animals 7 T= 23 Severely impaired  Trail Making Test A  299" 2 errors T= <20 Severely impaired  Trail Making Test B  Patient unable   Severely impaired  Clock Drawing   Impaired  GDS-15 10/15  Moderate  GAD-7 14/21  Moderate  PDQ-39     Mobility 67.5%    Activities of Daily Living 41.66%    Emotional Well Being 33.3%    Stigma 62.5%    Social Support 50%    Cognitive impairment Patient did not complete    Communication Patient did not complete    Bodily discomfort Patient did not complete        Description of Test Results:  Premorbid verbal intellectual abilities were estimated to have been within the average to high average range based on a test of word reading. Psychomotor processing speed was severely impaired (reduced from high average in 2015). Auditory attention and working memory were high average to average, respectively (reduced from superior and high average respectively in 2015). Visual-spatial  construction was impaired. Language abilities were variable. Specifically, confrontation naming was high average (consistent with 2015), and semantic verbal fluency was severely impaired (reduced from very superior in 2015). Auditory comprehension of complex ideational material was intact, consistent with 2015. With regard to verbal memory, encoding and acquisition of non-contextual information (i.e., word list) was borderline impaired (reduced significantly from 2015). After a brief distracter task, free recall was impaired (reduced significantly from 2015). After a delay, free recall was borderline impaired (reduced significantly from 2015). Cued recall was impaired. Performance on a yes/no recognition task was average. On another verbal memory test, encoding and acquisition of contextual auditory information (i.e., short stories) was impaired (significant decline relative to 2015). After a delay, free recall was low average (significant decline relative to 2015). Performance on a yes/no recognition task was below average (reduced from 2015). With regard to non-verbal memory, delayed free recall of  visual information was impaired (significant decline from 2015). Executive functioning was below expectation and declined from 2015. Mental flexibility and set-shifting were severely impaired; he was unable to complete Trails B and in 2015 he not only completed it but performed in the high average range. Verbal fluency with phonemic search restrictions was severely impaired (relative to superior in 2015). Verbal abstract reasoning was low average. Performance on a clock drawing task was impaired (reduced from 2015). On a self-report measure of mood, the patient's responses were indicative of clinically significant depression at the present time. Symptoms endorsed included: dissatisfaction with life, dropping activities/interests, feeling life is empty, boredom, unhappiness, preferring to stay home, feelings of  worthlessness, lack of energy and feeling others are better off than he is. On a self-report measure of anxiety, the patient endorsed clinically significant generalized anxiety at the present time, characterized by nervousness, inability to control worrying, excessive worrying, difficulty relaxing, restlessness, irritability and fear of something awful happening. On a self-report measure assessing the impact of PD symptoms on daily functioning and quality of life, he reported reduced mobility and increased stigma relative to his responses in 2015.     Clinical Impressions: Parkinson's disease dementia, without behavioral disturbance. Depression and anxiety. Unfortunately, results of this evaluation reveal significant cognitive impairment in multiple domains with clear evidence of significant decline relative to his performances on similar testing in 2015. In 07/2013, his testing was largely within normal limits with most performances falling in the high average to very superior range. On the current examination, he demonstrated severely impaired psychomotor speed, visual-spatial construction, verbal fluency, learning and memory, orientation to time, and mental flexibility/set-shifting. Confrontation naming, auditory comprehension, and auditory attention remain intact. His cognitive profile is consistent with what is seen in Parkinson's disease dementia. There is no indication of superimposed Alzheimer's disease at this time. He is also experiencing significant depression and anxiety, likely related to both the neurobiological and psychosocial aspects of Parkinson's disease.    Recommendations/Plan: Based on the findings of the present evaluation, the following recommendations are offered:  1. From a neurocognitive perspective, he appears to be a good candidate for memantine. He can speak with Dr. Carles Collet further about this. 2. Based on cognitive testing results, he will require assistance with all complex  ADLs. He is receiving assistance from his family with these tasks. He is no longer driving, and I spoke at length with him and his wife about my opinion that based on testing results it would be unsafe for him to return to driving. 3. I understand that his family is committed to having him stay in his home and they plan to make renovations to enable this. There may be additional Aid and Attendance benefits available through the [place], if they have not already pursued this. I gave them information on how to apply from the [place] website. 4. Continued caregiver support is essential. I provided information on the upcoming PD symposium.    Feedback to Patient: Zandra Abts Aro and his wife returned for a feedback appointment on 10/20/2016 to review the results of his neuropsychological evaluation with this provider. 40 minutes face-to-face time was spent reviewing his test results, my impressions and my recommendations as detailed above.    Total time spent on this patient's case: [id number] unit for interview with psychologist; [phone] units of testing by psychometrician under psychologist's supervision; [phone] units for medical record review, scoring of neuropsychological tests, interpretation of test results, preparation of this report, and review of results to the  patient by psychologist.      Thank you for your referral of DALLIN MCCORKEL. Please feel free to contact me if you have any questions or concerns regarding this report.

## 2016-10-20 ENCOUNTER — Ambulatory Visit (INDEPENDENT_AMBULATORY_CARE_PROVIDER_SITE_OTHER): Payer: Medicare PPO | Admitting: Psychology

## 2016-10-20 ENCOUNTER — Telehealth: Payer: Self-pay | Admitting: Neurology

## 2016-10-20 ENCOUNTER — Encounter: Payer: Self-pay | Admitting: Psychology

## 2016-10-20 DIAGNOSIS — M109 Gout, unspecified: Secondary | ICD-10-CM | POA: Diagnosis not present

## 2016-10-20 DIAGNOSIS — M48062 Spinal stenosis, lumbar region with neurogenic claudication: Secondary | ICD-10-CM | POA: Diagnosis not present

## 2016-10-20 DIAGNOSIS — G20 Parkinson's disease: Secondary | ICD-10-CM

## 2016-10-20 DIAGNOSIS — F0280 Dementia in other diseases classified elsewhere without behavioral disturbance: Secondary | ICD-10-CM

## 2016-10-20 DIAGNOSIS — G4733 Obstructive sleep apnea (adult) (pediatric): Secondary | ICD-10-CM | POA: Diagnosis not present

## 2016-10-20 DIAGNOSIS — M6281 Muscle weakness (generalized): Secondary | ICD-10-CM | POA: Diagnosis not present

## 2016-10-20 DIAGNOSIS — F329 Major depressive disorder, single episode, unspecified: Secondary | ICD-10-CM | POA: Diagnosis not present

## 2016-10-20 DIAGNOSIS — F4310 Post-traumatic stress disorder, unspecified: Secondary | ICD-10-CM | POA: Diagnosis not present

## 2016-10-20 DIAGNOSIS — M720 Palmar fascial fibromatosis [Dupuytren]: Secondary | ICD-10-CM | POA: Diagnosis not present

## 2016-10-20 DIAGNOSIS — I10 Essential (primary) hypertension: Secondary | ICD-10-CM | POA: Diagnosis not present

## 2016-10-20 NOTE — Telephone Encounter (Signed)
-----   Message from [place], DO sent at 10/20/2016  1:56 PM EDT ----- Luvenia Starch,  Please put in as phone encounter.  You can let them know that we will discuss at appt next month ----- Message ----- From: Kandis Nab, PsyD Sent: 10/20/2016  10:44 AM To: Ludwig Clarks, DO, [place] there, Just saw this patient and his wife to review neuropsych results (PDD). They are interested in memantine and want to discuss with you further whether he is a candidate. They have follow up with you on 8/17 and wife is wondering if they should move up that appointment (if able) or just wait. Thanks! MB

## 2016-10-20 NOTE — Telephone Encounter (Signed)
Mychart message sent to patient.

## 2016-10-21 ENCOUNTER — Encounter: Payer: Self-pay | Admitting: Medical Oncology

## 2016-10-21 ENCOUNTER — Emergency Department
Admission: EM | Admit: 2016-10-21 | Discharge: 2016-10-21 | Disposition: A | Discharge status: Home / Self Care | Payer: Medicare PPO | Attending: Student in an Organized Health Care Education/Training Program | Admitting: Student in an Organized Health Care Education/Training Program

## 2016-10-21 DIAGNOSIS — S01112A Laceration without foreign body of left eyelid and periocular area, initial encounter: Secondary | ICD-10-CM | POA: Diagnosis not present

## 2016-10-21 DIAGNOSIS — S0592XA Unspecified injury of left eye and orbit, initial encounter: Secondary | ICD-10-CM | POA: Diagnosis present

## 2016-10-21 DIAGNOSIS — M720 Palmar fascial fibromatosis [Dupuytren]: Secondary | ICD-10-CM | POA: Diagnosis not present

## 2016-10-21 DIAGNOSIS — Y999 Unspecified external cause status: Secondary | ICD-10-CM | POA: Diagnosis not present

## 2016-10-21 DIAGNOSIS — G20 Parkinson's disease: Secondary | ICD-10-CM | POA: Diagnosis not present

## 2016-10-21 DIAGNOSIS — Z791 Long term (current) use of non-steroidal anti-inflammatories (NSAID): Secondary | ICD-10-CM | POA: Diagnosis not present

## 2016-10-21 DIAGNOSIS — Z87891 Personal history of nicotine dependence: Secondary | ICD-10-CM | POA: Diagnosis not present

## 2016-10-21 DIAGNOSIS — G4733 Obstructive sleep apnea (adult) (pediatric): Secondary | ICD-10-CM | POA: Diagnosis not present

## 2016-10-21 DIAGNOSIS — F4310 Post-traumatic stress disorder, unspecified: Secondary | ICD-10-CM | POA: Diagnosis not present

## 2016-10-21 DIAGNOSIS — Z79899 Other long term (current) drug therapy: Secondary | ICD-10-CM | POA: Insufficient documentation

## 2016-10-21 DIAGNOSIS — M109 Gout, unspecified: Secondary | ICD-10-CM | POA: Diagnosis not present

## 2016-10-21 DIAGNOSIS — Y9301 Activity, walking, marching and hiking: Secondary | ICD-10-CM | POA: Diagnosis not present

## 2016-10-21 DIAGNOSIS — W2201XA Walked into wall, initial encounter: Secondary | ICD-10-CM | POA: Insufficient documentation

## 2016-10-21 DIAGNOSIS — Y92019 Unspecified place in single-family (private) house as the place of occurrence of the external cause: Secondary | ICD-10-CM | POA: Diagnosis not present

## 2016-10-21 DIAGNOSIS — M6281 Muscle weakness (generalized): Secondary | ICD-10-CM | POA: Diagnosis not present

## 2016-10-21 DIAGNOSIS — F329 Major depressive disorder, single episode, unspecified: Secondary | ICD-10-CM | POA: Diagnosis not present

## 2016-10-21 DIAGNOSIS — M48062 Spinal stenosis, lumbar region with neurogenic claudication: Secondary | ICD-10-CM | POA: Diagnosis not present

## 2016-10-21 DIAGNOSIS — I10 Essential (primary) hypertension: Secondary | ICD-10-CM | POA: Insufficient documentation

## 2016-10-21 DIAGNOSIS — W19XXXA Unspecified fall, initial encounter: Secondary | ICD-10-CM

## 2016-10-21 NOTE — ED Notes (Signed)
See triage note  Brought in  By family s/p fall  States he hit his head on door jam  No loc or other injury noted except for laceration left eye  Hx of parkinson's

## 2016-10-21 NOTE — ED Triage Notes (Signed)
Pt reports he tripped and fell into door, has lac to left eyebrow. Pt denies LOC or use of blood thinner.

## 2016-10-21 NOTE — ED Provider Notes (Signed)
[facility] Emergency Department Provider Note  ____________________________________________  Time seen: Approximately 3:41 PM  I have reviewed the triage vital signs and the nursing notes.   HISTORY  Chief Complaint Laceration    HPI [name] is a 66 y.o. male who presents emergency department complaining of a laceration to the left eyebrow. Patient has a history of bradycardia, Parkinson's disease. Patient has had 3 recent falls in the last 2 weeks. He has a appointment in 4 days to see his primary care to discuss frequent falls. Patient has been evaluated in the emergency department for his recent falls. He did sustain a concussion a week prior. Today, patient was walking when he tripped over his foot and bumped his head against the corner of a door jamb. Patient denied actually fall at this time. While he did hit his head, he did not lose consciousness. Patient has been acting at baseline per his wife. His wife is only concern for small laceration to the left eyebrow. Patient denies any headache, visual changes, neck pain, chest pain, shortness of breath, nausea or vomiting. He denies any radicular symptoms. Only complaint at this time is eyebrow laceration.   Past Medical History:  Diagnosis Date  . Arthritis   . Bradycardia   . Cancer [facility]) 2013   skin cancer  . Depression    ptsd  . Dysrhythmia    chronic slow heart rate  . GERD (gastroesophageal reflux disease)   . Headache(784.0)    tension headaches non recent  . History of chicken pox   . History of kidney stones   . Hypertension    treated with HCTZ  . Parkinson's disease ([place])    dx'ed 15 years ago  . PTSD (post-traumatic stress disorder)   . Shortness of breath dyspnea   . Sleep apnea    doesn't use C-pap  . Varicose veins     Patient Active Problem List   Diagnosis Date Noted  . Radicular pain in right arm 10/09/2016  . Rash 09/03/2016  . GERD (gastroesophageal reflux  disease) 07/31/2016  . Trochanteric bursitis 03/03/2016  . Left hand pain 10/30/2015  . Fracture, finger, multiple sites 10/30/2015  . Colon cancer screening 07/23/2015  . Healthcare maintenance 07/23/2015  . Gout 02/18/2015  . Depression 01/06/2015  . Irritation of eyelid 08/20/2014  . Dupuytren's contracture 08/20/2014  . Cough 07/21/2014  . Lumbar stenosis with neurogenic claudication 06/13/2014  . Preop cardiovascular exam 05/28/2014  . S/P deep brain stimulator placement 05/08/2014  . PD (Parkinson's disease) ([place]) 05/08/2014  . Joint pain 01/22/2014  . Medicare annual wellness visit, initial 01/19/2014  . Advance care planning 01/19/2014  . Parkinson's disease ([place]) 12/13/2013  . PTSD (post-traumatic stress disorder) 06/13/2013  . Erectile dysfunction 06/13/2013  . HLD (hyperlipidemia) 06/13/2013  . Hip pain 06/10/2013  . Pain in joint, shoulder region 06/10/2013  . Right leg swelling 06/03/2013  . Essential hypertension 06/03/2013  . Bradycardia by electrocardiogram 06/03/2013  . Obstructive sleep apnea 03/12/2013  . Spinal stenosis, lumbar region, with neurogenic claudication 12/14/2012    Past Surgical History:  Procedure Laterality Date  . CHOLECYSTECTOMY N/A 10/22/2014   Procedure: LAPAROSCOPIC CHOLECYSTECTOMY WITH INTRAOPERATIVE CHOLANGIOGRAM;  Surgeon: Dia Crawford III, MD;  Location: [facility] ORS;  Service: General;  Laterality: N/A;  . cyst removed      from lip as a child  . LUMBAR LAMINECTOMY/DECOMPRESSION MICRODISCECTOMY Bilateral 12/14/2012   Procedure: Bilateral lumbar three-four, four-five decompressive laminotomy/foraminotomy;  Surgeon: Cooper Render  Pool, MD;  Location: [place] NEURO ORS;  Service: Neurosurgery;  Laterality: Bilateral;  . PULSE GENERATOR IMPLANT Bilateral 12/13/2013   Procedure: Bilateral implantable pulse generator placement;  Surgeon: Erline Levine, MD;  Location: [place] NEURO ORS;  Service: Neurosurgery;  Laterality: Bilateral;  Bilateral implantable pulse  generator placement  . skin cancer removed     from ears,   12 lft arm  rt leg 15  . SUBTHALAMIC STIMULATOR INSERTION Bilateral 12/06/2013   Procedure: SUBTHALAMIC STIMULATOR INSERTION;  Surgeon: Erline Levine, MD;  Location: [place] NEURO ORS;  Service: Neurosurgery;  Laterality: Bilateral;  Bilateral deep brain stimulator placement    Prior to Admission medications   Medication Sig Start Date End Date Taking? Authorizing Provider  buPROPion (WELLBUTRIN XL) 150 MG 24 hr tablet Take 3 tablets (450 mg total) by mouth daily. 07/29/16   Tonia Ghent, MD  carbidopa-levodopa (SINEMET IR) 25-100 MG per tablet Take by mouth. 2 AM/ 1 afternoon/ 1 evening    [provider]  cholecalciferol (VITAMIN D) 1000 UNITS tablet Take 1,000 Units by mouth daily.    [provider]  escitalopram (LEXAPRO) 10 MG tablet Take 1 tablet (10 mg total) by mouth daily. 07/29/16   Tonia Ghent, MD  hydrOXYzine (ATARAX/VISTARIL) 10 MG tablet Take 10 mg by mouth at bedtime. 09/27/16   [provider]  naproxen (NAPROSYN) 500 MG tablet Take 500 mg by mouth 2 (two) times daily with a meal.    [provider]  NONFORMULARY OR COMPOUNDED ITEM Apply 1 application topically daily. Triamcinolone .25%/Cerave 1:1    [provider]  omeprazole (PRILOSEC) 20 MG capsule Take 1 capsule (20 mg total) by mouth daily. 07/29/16   Tonia Ghent, MD  pravastatin (PRAVACHOL) 20 MG tablet Take 1 tablet (20 mg total) by mouth every evening. 06/06/16   Minna Merritts, MD    Allergies Klonopin [clonazepam]; Morphine and related; and Prednisone  Family History  Problem Relation Age of Onset  . Lung cancer Father   . Colon cancer Neg Hx   . Prostate cancer Neg Hx     Social History Social History  Substance Use Topics  . Smoking status: Never Smoker  . Smokeless tobacco: Former Systems developer  . Alcohol use 0.0 oz/week     Comment: occasional (twice per month)     Review of Systems   Constitutional: No fever/chills Eyes: No visual changes.  ENT: No upper respiratory complaints. Cardiovascular: no chest pain. Respiratory: no cough. No SOB. Gastrointestinal: No abdominal pain.  No nausea, no vomiting.  Musculoskeletal: Negative for musculoskeletal pain. Skin: Positive for left eyebrow laceration Neurological: Negative for headaches, focal weakness or numbness. 10-point ROS otherwise negative.  ____________________________________________   PHYSICAL EXAM:  VITAL SIGNS: ED Triage Vitals  Enc Vitals Group     BP 10/21/16 1509 139/77     Pulse Rate 10/21/16 1509 (!) 56     Resp 10/21/16 1509 17     Temp 10/21/16 1509 97.8 F (36.6 C)     Temp Source 10/21/16 1509 Oral     SpO2 10/21/16 1509 97 %     Weight 10/21/16 1505 179 lb (81.2 kg)     Height 10/21/16 1505 5\' 9"  (1.753 m)     Head Circumference --      Peak Flow --      Pain Score 10/21/16 1505 5     Pain Loc --      Pain Edu? --  Excl. in [place]? --      Constitutional: Alert and oriented. Well appearing and in no acute distress. Eyes: Conjunctivae are normal. PERRL. EOMI. Head: 2 cm laceration noted through the left eyebrow. Edges are well approximated. No bleeding. No foreign body. No ecchymosis or surrounding edema. Patient is nontender to palpation over the osseous structures of the skull and face. No battle signs. No raccoon eyes. No serosanguineous fluid drainage from ears or nares ENT:      Ears:       Nose: No congestion/rhinnorhea.      Mouth/Throat: Mucous membranes are moist.  Neck: No stridor.  No cervical spine tenderness to palpation.  Cardiovascular: Normal rate, regular rhythm. Normal S1 and S2.  Good peripheral circulation. Respiratory: Normal respiratory effort without tachypnea or retractions. Lungs CTAB. Good air entry to the bases with no decreased or absent breath sounds. Musculoskeletal: Full range of motion to all extremities. No gross deformities appreciated. Neurologic:   Normal speech and language. No gross focal neurologic deficits are appreciated. Cranial nerves II through XII grossly intact. Skin:  Skin is warm, dry and intact. No rash noted. Psychiatric: Mood and affect are normal. Speech and behavior are normal. Patient exhibits appropriate insight and judgement.   ____________________________________________   LABS (all labs ordered are listed, but only abnormal results are displayed)  Labs Reviewed - No data to display ____________________________________________  EKG   ____________________________________________  RADIOLOGY   No results found.  ____________________________________________    PROCEDURES  Procedure(s) performed:    Marland KitchenMarland KitchenLaceration Repair Date/Time: 10/21/2016 3:52 PM Performed by: Betha Loa D Authorized by: Betha Loa D   Consent:    Consent obtained:  Verbal   Consent given by:  Patient   Risks discussed:  Poor cosmetic result Anesthesia (see MAR for exact dosages):    Anesthesia method:  None Laceration details:    Location:  Face   Face location:  L eyebrow   Length (cm):  2 Repair type:    Repair type:  Simple Exploration:    Hemostasis achieved with:  Direct pressure   Wound exploration: entire depth of wound probed and visualized     Wound extent: no foreign bodies/material noted     Contaminated: no   Treatment:    Area cleansed with:  Shur-Clens   Amount of cleaning:  Standard Skin repair:    Repair method:  Tissue adhesive Approximation:    Approximation:  Close Post-procedure details:    Dressing:  Open (no dressing)   Patient tolerance of procedure:  Tolerated well, no immediate complications      Medications - No data to display   ____________________________________________   INITIAL IMPRESSION / ASSESSMENT AND PLAN / ED COURSE  Pertinent labs & imaging results that were available during my care of the patient were reviewed by me and considered in my medical  decision making (see chart for details).  Review of the [place] CSRS was performed in accordance of the [place] prior to dispensing any controlled drugs.     Patient's diagnosis is consistent with a laceration to the left eyebrow status post a fall/tripping episode. The patient tripped and hit the left side of his face against a door jamb. No concerning signs or symptoms at this time. Exam is reassuring. After discussion with patient and his wife, they called for no work up. At this time, with reassuring signs and symptoms as well as physical exam, I concur with no additional workup at this time. Laceration is closed as  described above.Marland Kitchen No prescribed medications at this time. Patient has an appointment to see his primary care in 4 days and will keep same. Patient is given ED precautions to return to the ED for any worsening or new symptoms.     ____________________________________________  FINAL CLINICAL IMPRESSION(S) / ED DIAGNOSES  Final diagnoses:  Laceration of left eyebrow, initial encounter  Fall, initial encounter      NEW MEDICATIONS STARTED DURING THIS VISIT:  New Prescriptions   No medications on file        This chart was dictated using voice recognition software/Dragon. Despite best efforts to proofread, errors can occur which can change the meaning. Any change was purely unintentional.    Darletta Moll, PA-C 10/21/16 1554    Merlyn Lot, MD 10/24/16 [phone]

## 2016-10-24 ENCOUNTER — Telehealth: Payer: Self-pay | Admitting: Family Medicine

## 2016-10-24 DIAGNOSIS — M6281 Muscle weakness (generalized): Secondary | ICD-10-CM | POA: Diagnosis not present

## 2016-10-24 DIAGNOSIS — G20 Parkinson's disease: Secondary | ICD-10-CM | POA: Diagnosis not present

## 2016-10-24 DIAGNOSIS — G4733 Obstructive sleep apnea (adult) (pediatric): Secondary | ICD-10-CM | POA: Diagnosis not present

## 2016-10-24 DIAGNOSIS — M48062 Spinal stenosis, lumbar region with neurogenic claudication: Secondary | ICD-10-CM | POA: Diagnosis not present

## 2016-10-24 DIAGNOSIS — M109 Gout, unspecified: Secondary | ICD-10-CM | POA: Diagnosis not present

## 2016-10-24 DIAGNOSIS — F329 Major depressive disorder, single episode, unspecified: Secondary | ICD-10-CM | POA: Diagnosis not present

## 2016-10-24 DIAGNOSIS — M720 Palmar fascial fibromatosis [Dupuytren]: Secondary | ICD-10-CM | POA: Diagnosis not present

## 2016-10-24 DIAGNOSIS — F4310 Post-traumatic stress disorder, unspecified: Secondary | ICD-10-CM | POA: Diagnosis not present

## 2016-10-24 DIAGNOSIS — I10 Essential (primary) hypertension: Secondary | ICD-10-CM | POA: Diagnosis not present

## 2016-10-24 NOTE — Telephone Encounter (Signed)
He is expected to have orthostatic changes given his Parkinson's disease. Caution with standing, avoid standing quickly. Try to maintain adequate hydration and okay to add some extra salt to his food to try to raise his blood pressure some, as long as he does not have significant increase in lower extremity edema.  That is likely the best option.  Thanks.

## 2016-10-24 NOTE — Telephone Encounter (Signed)
[name] with [facility] called to say pt keeps falling. Had to go to hospital due to laceration on left eyebrow. Also wanted to inform that when pt was sitting, he  was at 122/80 and standing  Was at 80/60. Can reach her at [phone].

## 2016-10-25 ENCOUNTER — Ambulatory Visit (INDEPENDENT_AMBULATORY_CARE_PROVIDER_SITE_OTHER): Payer: Medicare PPO | Admitting: Family Medicine

## 2016-10-25 ENCOUNTER — Encounter: Payer: Self-pay | Admitting: Family Medicine

## 2016-10-25 DIAGNOSIS — G20 Parkinson's disease: Secondary | ICD-10-CM | POA: Diagnosis not present

## 2016-10-25 NOTE — Telephone Encounter (Signed)
[name] with [facility] advised.  [name] states that she believes it is just a matter of time before he experiences a catastrophic fall.  Patient apparently did some neuro_____ testing and wife says it did not go well.  Patient is on the schedule to come in today for OV.

## 2016-10-25 NOTE — Progress Notes (Signed)
His balance is worse recently, with presumed concussion hx, along with BP and pulse variation noted. Multiple falls, with gait changes from Parkinson's in spite of walker use. Orthostatic symptoms likely related to Parkinson's disease. He is trying to get home modifications done to make his home situation safer. Extensive discussion with patient and wife about options for safety. He will also stay at home as long as possible.  Laceration near the left eyebrow was closed with adhesive and did not require suture. It is healing normally.  Meds, vitals, and allergies reviewed.   ROS: Per HPI unless specifically indicated in ROS section   nad Ncat except for L brow lac healing Mmm Neck supple, no LA rrr ctab Abd soft Ext w/o edema Gait is slow and shuffling. He uses walker at baseline for reinforcement.

## 2016-10-25 NOTE — Progress Notes (Signed)
Patient's pulse registered low at 49 on pulse ox.  Checked manually with ? Results, pulse very hard to detect.  Mike Craze, CMA

## 2016-10-25 NOTE — Telephone Encounter (Signed)
Will see today. Thanks

## 2016-10-25 NOTE — Patient Instructions (Signed)
Check on getting into rocky steady boxing.  Increase your salt, increase your water intake.  Check on getting a fall alert button.  We'll go from there.  Take care.  Glad to see you.

## 2016-10-26 ENCOUNTER — Telehealth: Payer: Self-pay

## 2016-10-26 ENCOUNTER — Institutional Professional Consult (permissible substitution): Payer: Self-pay | Admitting: Pulmonary Disease

## 2016-10-26 DIAGNOSIS — M48062 Spinal stenosis, lumbar region with neurogenic claudication: Secondary | ICD-10-CM | POA: Diagnosis not present

## 2016-10-26 DIAGNOSIS — F329 Major depressive disorder, single episode, unspecified: Secondary | ICD-10-CM | POA: Diagnosis not present

## 2016-10-26 DIAGNOSIS — I10 Essential (primary) hypertension: Secondary | ICD-10-CM | POA: Diagnosis not present

## 2016-10-26 DIAGNOSIS — F4310 Post-traumatic stress disorder, unspecified: Secondary | ICD-10-CM | POA: Diagnosis not present

## 2016-10-26 DIAGNOSIS — M720 Palmar fascial fibromatosis [Dupuytren]: Secondary | ICD-10-CM | POA: Diagnosis not present

## 2016-10-26 DIAGNOSIS — G4733 Obstructive sleep apnea (adult) (pediatric): Secondary | ICD-10-CM | POA: Diagnosis not present

## 2016-10-26 DIAGNOSIS — M6281 Muscle weakness (generalized): Secondary | ICD-10-CM | POA: Diagnosis not present

## 2016-10-26 DIAGNOSIS — G20 Parkinson's disease: Secondary | ICD-10-CM | POA: Diagnosis not present

## 2016-10-26 DIAGNOSIS — M109 Gout, unspecified: Secondary | ICD-10-CM | POA: Diagnosis not present

## 2016-10-26 NOTE — Telephone Encounter (Signed)
Rowe Clack OT with [facility] left v/m requesting verbal orders for [facility] OT 2 x a week for the week of 11/09/16 for safety education and assessment in the bathroom. Presently getting bathroom remodeled.

## 2016-10-26 NOTE — Assessment & Plan Note (Signed)
With gait changes and orthostasis related to Parkinson's. Discussed with him about increasing salt, increase fluid intake, using his walker, getting set up with rock steady boxing, getting a fall alert button. Family is trying to make home modifications to make his home environment is safe as possible. 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 He will still stay in his home as long as possible. >25 minutes spent in face to face time with patient, >50% spent in counselling or coordination of care.

## 2016-10-26 NOTE — Telephone Encounter (Signed)
Left message on voicemail for Izora Gala to call back.

## 2016-10-26 NOTE — Telephone Encounter (Signed)
Please give the order.  Thanks.   

## 2016-10-27 DIAGNOSIS — M720 Palmar fascial fibromatosis [Dupuytren]: Secondary | ICD-10-CM | POA: Diagnosis not present

## 2016-10-27 DIAGNOSIS — M6281 Muscle weakness (generalized): Secondary | ICD-10-CM | POA: Diagnosis not present

## 2016-10-27 DIAGNOSIS — G20 Parkinson's disease: Secondary | ICD-10-CM | POA: Diagnosis not present

## 2016-10-27 DIAGNOSIS — F4310 Post-traumatic stress disorder, unspecified: Secondary | ICD-10-CM | POA: Diagnosis not present

## 2016-10-27 DIAGNOSIS — I10 Essential (primary) hypertension: Secondary | ICD-10-CM | POA: Diagnosis not present

## 2016-10-27 DIAGNOSIS — M109 Gout, unspecified: Secondary | ICD-10-CM | POA: Diagnosis not present

## 2016-10-27 DIAGNOSIS — F329 Major depressive disorder, single episode, unspecified: Secondary | ICD-10-CM | POA: Diagnosis not present

## 2016-10-27 DIAGNOSIS — M48062 Spinal stenosis, lumbar region with neurogenic claudication: Secondary | ICD-10-CM | POA: Diagnosis not present

## 2016-10-27 DIAGNOSIS — G4733 Obstructive sleep apnea (adult) (pediatric): Secondary | ICD-10-CM | POA: Diagnosis not present

## 2016-10-27 NOTE — Telephone Encounter (Signed)
Rowe Clack, OT with [facility] advised.

## 2016-11-01 DIAGNOSIS — G20 Parkinson's disease: Secondary | ICD-10-CM | POA: Diagnosis not present

## 2016-11-01 DIAGNOSIS — M109 Gout, unspecified: Secondary | ICD-10-CM | POA: Diagnosis not present

## 2016-11-01 DIAGNOSIS — I10 Essential (primary) hypertension: Secondary | ICD-10-CM | POA: Diagnosis not present

## 2016-11-01 DIAGNOSIS — G4733 Obstructive sleep apnea (adult) (pediatric): Secondary | ICD-10-CM | POA: Diagnosis not present

## 2016-11-01 DIAGNOSIS — F329 Major depressive disorder, single episode, unspecified: Secondary | ICD-10-CM | POA: Diagnosis not present

## 2016-11-01 DIAGNOSIS — F4310 Post-traumatic stress disorder, unspecified: Secondary | ICD-10-CM | POA: Diagnosis not present

## 2016-11-01 DIAGNOSIS — M48062 Spinal stenosis, lumbar region with neurogenic claudication: Secondary | ICD-10-CM | POA: Diagnosis not present

## 2016-11-01 DIAGNOSIS — M720 Palmar fascial fibromatosis [Dupuytren]: Secondary | ICD-10-CM | POA: Diagnosis not present

## 2016-11-01 DIAGNOSIS — M6281 Muscle weakness (generalized): Secondary | ICD-10-CM | POA: Diagnosis not present

## 2016-11-02 ENCOUNTER — Encounter: Payer: Self-pay | Admitting: Internal Medicine

## 2016-11-02 ENCOUNTER — Ambulatory Visit (INDEPENDENT_AMBULATORY_CARE_PROVIDER_SITE_OTHER): Payer: Medicare PPO | Admitting: Internal Medicine

## 2016-11-02 ENCOUNTER — Telehealth: Payer: Self-pay | Admitting: Family Medicine

## 2016-11-02 VITALS — BP 118/62 | HR 49 | Ht 69.0 in | Wt 179.6 lb

## 2016-11-02 DIAGNOSIS — G4752 REM sleep behavior disorder: Secondary | ICD-10-CM | POA: Diagnosis not present

## 2016-11-02 DIAGNOSIS — G4733 Obstructive sleep apnea (adult) (pediatric): Secondary | ICD-10-CM

## 2016-11-02 DIAGNOSIS — G20 Parkinson's disease: Secondary | ICD-10-CM | POA: Diagnosis not present

## 2016-11-02 NOTE — Telephone Encounter (Signed)
Left message on voicemail for [name] to call back.

## 2016-11-02 NOTE — Patient Instructions (Addendum)
Order- Schedule split protocol NPSG    Dx OSA, REM Behavior Disorder, Parkinson's - will sleep on wedge,  Needs one on one tech  Please call as needed  We will set up a return appointment once we know when your study is scheduled

## 2016-11-02 NOTE — Assessment & Plan Note (Addendum)
He and his wife give a characteristic history. This problem is common in association with Parkinson's disease. Unfortunately he didn't tolerate a reasonable dose of clonazepam by description. He is already on antidepressants and Sinemet. Melatonin has been helpful with this problem in some reports and may be our next consideration after his pending sleep study.

## 2016-11-02 NOTE — Telephone Encounter (Signed)
Verbal order given to [name] as instructed. 

## 2016-11-02 NOTE — Assessment & Plan Note (Signed)
He had mild obstructive sleep apnea, AHI 12/hour, when tested in 2012. He was not comfortable with CPAP at that time. He does snore some and has some witnessed apneas. It is not clear whether daytime sleepiness is due to sleep disordered breathing, sleep disturbance related to REM behavior disorder, a direct effect of Parkinson's or his medications. Plan-split protocol sleep study is needed at sleep center watching in particular for evidence of REM behavior disorder.

## 2016-11-02 NOTE — Progress Notes (Signed)
7PM.11/02/16-66 year old male never smoker with obstructive sleep apnea with REM Behavior Disorder, complicated by Parkinson's disease followed by neurology/Dr. Tat, with neurostimulator and Sinemet-dysphagia and depression significant. NPSG 06/17/10- AHI 12/ hr He had been followed by Dr.Ramachandran, LOV 2017,  at [facility] but indicates he has not worn CPAP in about 2 years. 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 CPAP was uncomfortable and he quit. Stays tired, snores some. RBD in shared bed- actively punches and kicks at her, but not getting out of bed. Little specific dream recall but vivid- worse after DBS placed.Marland Kitchen  Has been better on Welbutrin plus Lexapro and Sinemet, taken at 7PM. [facility] not recorded this visit. Clonazepam 0.5 mg caused excessive sedation and depression. Melatonin was mentioned as a possibility, but I can't tell if it was tried. Wife thinks he chokes occ at night. Has GERD and dysphagia. Swallowing eval recommended thickener and small bites but note says "non-compliant".  Bedtime 9-10 P, short latency, WASO 2-3, up 8:30 AM  Prior to Admission medications   Medication Sig Start Date End Date Taking? Authorizing Provider  buPROPion (WELLBUTRIN XL) 150 MG 24 hr tablet Take 3 tablets (450 mg total) by mouth daily. 07/29/16  Yes Tonia Ghent, MD  carbidopa-levodopa (SINEMET IR) 25-100 MG per tablet Take by mouth. 2 AM/ 1 afternoon/ 1 evening   Yes [provider]  cholecalciferol (VITAMIN D) 1000 UNITS tablet Take 1,000 Units by mouth daily.   Yes [provider]  escitalopram (LEXAPRO) 10 MG tablet Take 1 tablet (10 mg total) by mouth daily. 07/29/16  Yes Tonia Ghent, MD  hydrOXYzine (ATARAX/VISTARIL) 10 MG tablet Take 10 mg by mouth at bedtime. 09/27/16  Yes [provider]  naproxen (NAPROSYN) 500 MG tablet Take 500 mg by mouth 2 (two)  times daily with a meal.   Yes [provider]  NONFORMULARY OR COMPOUNDED ITEM Apply 1 application topically daily. Triamcinolone .25%/Cerave 1:1   Yes [provider]  omeprazole (PRILOSEC) 20 MG capsule Take 1 capsule (20 mg total) by mouth daily. 07/29/16  Yes Tonia Ghent, MD  pravastatin (PRAVACHOL) 20 MG tablet Take 1 tablet (20 mg total) by mouth every evening. 06/06/16  Yes Minna Merritts, MD   Past Medical History:  Diagnosis Date  . Arthritis   . Bradycardia   . Cancer [facility]) 2013   skin cancer  . Depression    ptsd  . Dysrhythmia    chronic slow heart rate  . GERD (gastroesophageal reflux disease)   . Headache(784.0)    tension headaches non recent  . History of chicken pox   . History of kidney stones   . Hypertension    treated with HCTZ  . Parkinson's disease ([place])    dx'ed 15 years ago  . PTSD (post-traumatic stress disorder)   . Shortness of breath dyspnea   . Sleep apnea    doesn't use C-pap  . Varicose veins    Past Surgical History:  Procedure Laterality Date  . CHOLECYSTECTOMY N/A 10/22/2014   Procedure: LAPAROSCOPIC CHOLECYSTECTOMY WITH INTRAOPERATIVE CHOLANGIOGRAM;  Surgeon: Dia Crawford III, MD;  Location: [facility] ORS;  Service: General;  Laterality: N/A;  . cyst removed      from lip as a child  . LUMBAR LAMINECTOMY/DECOMPRESSION MICRODISCECTOMY Bilateral 12/14/2012   Procedure: Bilateral lumbar three-four, four-five decompressive laminotomy/foraminotomy;  Surgeon: Charlie Pitter, MD;  Location: [facility]  NEURO ORS;  Service: Neurosurgery;  Laterality: Bilateral;  . PULSE GENERATOR IMPLANT Bilateral 12/13/2013   Procedure: Bilateral implantable pulse generator placement;  Surgeon: Erline Levine, MD;  Location: [place] NEURO ORS;  Service: Neurosurgery;  Laterality: Bilateral;  Bilateral implantable pulse generator placement  . skin cancer removed     from ears,   12 lft arm  rt leg 15  . SUBTHALAMIC STIMULATOR INSERTION Bilateral 12/06/2013   Procedure:  SUBTHALAMIC STIMULATOR INSERTION;  Surgeon: Erline Levine, MD;  Location: [place] NEURO ORS;  Service: Neurosurgery;  Laterality: Bilateral;  Bilateral deep brain stimulator placement   Family History  Problem Relation Age of Onset  . Lung cancer Father   . Colon cancer Neg Hx   . Prostate cancer Neg Hx    Social History   Social History  . Marital status: Married    Spouse name: N/A  . Number of children: N/A  . Years of education: N/A   Occupational History  . disabled     2001, [employer]  .      search and rescue helicopter   Social History Main Topics  . Smoking status: Never Smoker  . Smokeless tobacco: Former Systems developer  . Alcohol use 0.0 oz/week     Comment: occasional (twice per month)  . Drug use: No  . Sexual activity: No   Other Topics Concern  . 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  Prev search and rescue work   3 kids from prev relationship.    Married 2014   ROS-see HPI  + = pos Constitutional:    weight loss, night sweats, fevers, chills, fatigue, lassitude. HEENT:    headaches, difficulty swallowing, tooth/dental problems, sore throat,       sneezing, itching, ear ache, nasal congestion, post nasal drip, snoring CV:    chest pain, orthopnea, PND, swelling in lower extremities, anasarca,                                                       dizziness, palpitations   +slow pulse Resp:   shortness of breath with exertion or at rest.                productive cough,   non-productive cough, coughing up of blood.              change in color of mucus.  wheezing.   Skin:    rash or lesions. GI:  No-   heartburn, indigestion, abdominal pain, nausea, vomiting, diarrhea,                 change in bowel habits, loss of appetite GU: dysuria, change in color of urine, no urgency or frequency.   flank pain. MS:   joint pain, stiffness, decreased range of motion, back pain. Neuro-     nothing unusual Psych:  change in mood or affect.   depression or anxiety.   memory loss.  OBJ- Physical Exam General- Alert, Oriented, Affect-appropriate, Distress- none acute, not obese Skin- rash-none, lesions- none, excoriation- none Lymphadenopathy- none Head- atraumatic            Eyes- Gross vision intact, PERRLA, conjunctivae and secretions clear            Ears- Hearing, canals-normal  Nose- Clear, no-Septal dev, mucus, polyps, erosion, perforation             Throat- Mallampati III , mucosa clear , drainage- none, tonsils- atrophic, own teeth Neck- flexible , trachea midline, no stridor , thyroid nl, carotid no bruit Chest - symmetrical excursion , unlabored           Heart/CV- +slow 49 RRR , no murmur , no gallop  , no rub, nl s1 s2                           - JVD- none , edema- none, stasis changes- none, varices- none           Lung- clear to P&A, wheeze- none, cough- none , dullness-none, rub- none           Chest wall-  Abd-  Br/ Gen/ Rectal- Not done, not indicated Extrem- cyanosis- none, clubbing, none, atrophy- none, strength- nl Neuro- grossly intact to observation, +rolling walker, no tremor noted  .

## 2016-11-02 NOTE — Telephone Encounter (Signed)
Please give the order.  Thanks.   

## 2016-11-02 NOTE — Telephone Encounter (Signed)
[name] from [facility] called to get verbal orders to continue Home health speech therapy 2 times a week for 2 more weeks. Can reach [phone].

## 2016-11-02 NOTE — Assessment & Plan Note (Signed)
Managed by neurology/Dr. Tat on Sinemet with deep brain stimulator

## 2016-11-03 ENCOUNTER — Telehealth: Payer: Self-pay

## 2016-11-03 NOTE — Telephone Encounter (Signed)
Please give the verbal order to proceed and I'll await the fax.  Thanks.

## 2016-11-03 NOTE — Telephone Encounter (Signed)
Verbal order for palliative care consult given to [facility] [place].

## 2016-11-03 NOTE — Telephone Encounter (Signed)
Rhonda with [facility] left v/m; pts wife called and requested palliative care consult; Suanne Marker has faxed over palliative care forms.

## 2016-11-04 DIAGNOSIS — F4310 Post-traumatic stress disorder, unspecified: Secondary | ICD-10-CM | POA: Diagnosis not present

## 2016-11-04 DIAGNOSIS — M109 Gout, unspecified: Secondary | ICD-10-CM | POA: Diagnosis not present

## 2016-11-04 DIAGNOSIS — I10 Essential (primary) hypertension: Secondary | ICD-10-CM | POA: Diagnosis not present

## 2016-11-04 DIAGNOSIS — F329 Major depressive disorder, single episode, unspecified: Secondary | ICD-10-CM | POA: Diagnosis not present

## 2016-11-04 DIAGNOSIS — M6281 Muscle weakness (generalized): Secondary | ICD-10-CM | POA: Diagnosis not present

## 2016-11-04 DIAGNOSIS — M720 Palmar fascial fibromatosis [Dupuytren]: Secondary | ICD-10-CM | POA: Diagnosis not present

## 2016-11-04 DIAGNOSIS — M48062 Spinal stenosis, lumbar region with neurogenic claudication: Secondary | ICD-10-CM | POA: Diagnosis not present

## 2016-11-04 DIAGNOSIS — G20 Parkinson's disease: Secondary | ICD-10-CM | POA: Diagnosis not present

## 2016-11-04 DIAGNOSIS — G4733 Obstructive sleep apnea (adult) (pediatric): Secondary | ICD-10-CM | POA: Diagnosis not present

## 2016-11-07 ENCOUNTER — Telehealth: Payer: Self-pay | Admitting: Family Medicine

## 2016-11-07 NOTE — Telephone Encounter (Signed)
Please give the order.  Thanks.   

## 2016-11-07 NOTE — Telephone Encounter (Signed)
[facility]e [facility] called -  Requesting extension for OT Home health 2 additional visits for this week.  [phone]

## 2016-11-07 NOTE — Telephone Encounter (Signed)
Rowe Clack from [facility] states that since placing the previous request, she contacted the patient and wife and they have not followed through with what was requested of them to justify additional visits for teaching.  Therefore, the patient and wife agreed on discharge at this point.  No order was given for additional visits.

## 2016-11-08 DIAGNOSIS — G20 Parkinson's disease: Secondary | ICD-10-CM | POA: Diagnosis not present

## 2016-11-08 DIAGNOSIS — M5412 Radiculopathy, cervical region: Secondary | ICD-10-CM | POA: Diagnosis not present

## 2016-11-08 DIAGNOSIS — M720 Palmar fascial fibromatosis [Dupuytren]: Secondary | ICD-10-CM | POA: Diagnosis not present

## 2016-11-08 DIAGNOSIS — G4733 Obstructive sleep apnea (adult) (pediatric): Secondary | ICD-10-CM | POA: Diagnosis not present

## 2016-11-08 DIAGNOSIS — M109 Gout, unspecified: Secondary | ICD-10-CM | POA: Diagnosis not present

## 2016-11-08 DIAGNOSIS — F4310 Post-traumatic stress disorder, unspecified: Secondary | ICD-10-CM | POA: Diagnosis not present

## 2016-11-08 DIAGNOSIS — M6281 Muscle weakness (generalized): Secondary | ICD-10-CM | POA: Diagnosis not present

## 2016-11-08 DIAGNOSIS — I10 Essential (primary) hypertension: Secondary | ICD-10-CM | POA: Diagnosis not present

## 2016-11-08 DIAGNOSIS — M48062 Spinal stenosis, lumbar region with neurogenic claudication: Secondary | ICD-10-CM | POA: Diagnosis not present

## 2016-11-08 DIAGNOSIS — F329 Major depressive disorder, single episode, unspecified: Secondary | ICD-10-CM | POA: Diagnosis not present

## 2016-11-10 DIAGNOSIS — F4310 Post-traumatic stress disorder, unspecified: Secondary | ICD-10-CM | POA: Diagnosis not present

## 2016-11-10 DIAGNOSIS — G20 Parkinson's disease: Secondary | ICD-10-CM | POA: Diagnosis not present

## 2016-11-10 DIAGNOSIS — M720 Palmar fascial fibromatosis [Dupuytren]: Secondary | ICD-10-CM | POA: Diagnosis not present

## 2016-11-10 DIAGNOSIS — M109 Gout, unspecified: Secondary | ICD-10-CM | POA: Diagnosis not present

## 2016-11-10 DIAGNOSIS — I10 Essential (primary) hypertension: Secondary | ICD-10-CM | POA: Diagnosis not present

## 2016-11-10 DIAGNOSIS — F329 Major depressive disorder, single episode, unspecified: Secondary | ICD-10-CM | POA: Diagnosis not present

## 2016-11-10 DIAGNOSIS — G4733 Obstructive sleep apnea (adult) (pediatric): Secondary | ICD-10-CM | POA: Diagnosis not present

## 2016-11-10 DIAGNOSIS — M6281 Muscle weakness (generalized): Secondary | ICD-10-CM | POA: Diagnosis not present

## 2016-11-10 DIAGNOSIS — M48062 Spinal stenosis, lumbar region with neurogenic claudication: Secondary | ICD-10-CM | POA: Diagnosis not present

## 2016-11-11 ENCOUNTER — Telehealth: Payer: Self-pay

## 2016-11-11 NOTE — Telephone Encounter (Signed)
Rhonda with [facility] request copy of last office note and demographic sheet faxed to [phone]. Done.

## 2016-11-15 ENCOUNTER — Ambulatory Visit (INDEPENDENT_AMBULATORY_CARE_PROVIDER_SITE_OTHER)
Admission: RE | Admit: 2016-11-15 | Discharge: 2016-11-15 | Disposition: A | Discharge status: Home / Self Care | Payer: Medicare PPO | Source: Ambulatory Visit | Attending: Family Medicine | Admitting: Family Medicine

## 2016-11-15 ENCOUNTER — Ambulatory Visit (INDEPENDENT_AMBULATORY_CARE_PROVIDER_SITE_OTHER): Payer: Medicare PPO | Admitting: Family Medicine

## 2016-11-15 ENCOUNTER — Encounter: Payer: Self-pay | Admitting: Family Medicine

## 2016-11-15 DIAGNOSIS — M79601 Pain in right arm: Secondary | ICD-10-CM | POA: Diagnosis not present

## 2016-11-15 DIAGNOSIS — S4991XA Unspecified injury of right shoulder and upper arm, initial encounter: Secondary | ICD-10-CM | POA: Diagnosis not present

## 2016-11-15 DIAGNOSIS — R296 Repeated falls: Secondary | ICD-10-CM | POA: Diagnosis not present

## 2016-11-15 NOTE — Progress Notes (Signed)
Subjective:   Patient ID: [name], male    DOB: [birth year], 66 y.o.   MRN: [id number]  [name] is a pleasant 66 y.o. year old male patient of Dr. Damita Dunnings, new to me, who presents to clinic today with her wife for arm and Shoulder Pain and Fall (increased falls over the past week )  on 11/15/2016  HPI:  Recently referred to hospice. Has initial evaluation on Monday.  Has PD with increased falls.  Yesterday, fell 8 times on his left side while walking with his walker.  "My body pulls left now."  He does not use the wheelchair much in the house, just his "special parkinsons walker."  Current Outpatient Prescriptions on File Prior to Visit  Medication Sig Dispense Refill  . buPROPion (WELLBUTRIN XL) 150 MG 24 hr tablet Take 3 tablets (450 mg total) by mouth daily. 270 tablet 3  . carbidopa-levodopa (SINEMET IR) 25-100 MG per tablet Take by mouth. 2 AM/ 1 afternoon/ 1 evening    . cholecalciferol (VITAMIN D) 1000 UNITS tablet Take 1,000 Units by mouth daily.    Marland Kitchen escitalopram (LEXAPRO) 10 MG tablet Take 1 tablet (10 mg total) by mouth daily. 90 tablet 3  . hydrOXYzine (ATARAX/VISTARIL) 10 MG tablet Take 10 mg by mouth at bedtime.    . naproxen (NAPROSYN) 500 MG tablet Take 500 mg by mouth 2 (two) times daily with a meal.    . NONFORMULARY OR COMPOUNDED ITEM Apply 1 application topically daily. Triamcinolone .25%/Cerave 1:1    . omeprazole (PRILOSEC) 20 MG capsule Take 1 capsule (20 mg total) by mouth daily. 90 capsule 1  . pravastatin (PRAVACHOL) 20 MG tablet Take 1 tablet (20 mg total) by mouth every evening. 90 tablet 3   No current facility-administered medications on file prior to visit.     Allergies  Allergen Reactions  . Klonopin [Clonazepam] Other (See Comments)    Worsening mood/depression  . Morphine And Related     hallucinations  . Prednisone Other (See Comments)    Unclear reaction    Past Medical History:  Diagnosis Date  . Arthritis   . Bradycardia     . Cancer [facility]) 2013   skin cancer  . Depression    ptsd  . Dysrhythmia    chronic slow heart rate  . GERD (gastroesophageal reflux disease)   . Headache(784.0)    tension headaches non recent  . History of chicken pox   . History of kidney stones   . Hypertension    treated with HCTZ  . Parkinson's disease ([name])    dx'ed 15 years ago  . PTSD (post-traumatic stress disorder)   . Shortness of breath dyspnea   . Sleep apnea    doesn't use C-pap  . Varicose veins     Past Surgical History:  Procedure Laterality Date  . CHOLECYSTECTOMY N/A 10/22/2014   Procedure: LAPAROSCOPIC CHOLECYSTECTOMY WITH INTRAOPERATIVE CHOLANGIOGRAM;  Surgeon: Dia Crawford III, MD;  Location: [facility] ORS;  Service: General;  Laterality: N/A;  . cyst removed      from lip as a child  . LUMBAR LAMINECTOMY/DECOMPRESSION MICRODISCECTOMY Bilateral 12/14/2012   Procedure: Bilateral lumbar three-four, four-five decompressive laminotomy/foraminotomy;  Surgeon: Charlie Pitter, MD;  Location: [place] NEURO ORS;  Service: Neurosurgery;  Laterality: Bilateral;  . PULSE GENERATOR IMPLANT Bilateral 12/13/2013   Procedure: Bilateral implantable pulse generator placement;  Surgeon: Erline Levine, MD;  Location: [place] NEURO ORS;  Service: Neurosurgery;  Laterality: Bilateral;  Bilateral implantable  pulse generator placement  . skin cancer removed     from ears,   12 lft arm  rt leg 14  . SUBTHALAMIC STIMULATOR INSERTION Bilateral 12/06/2013   Procedure: SUBTHALAMIC STIMULATOR INSERTION;  Surgeon: Erline Levine, MD;  Location: [place] NEURO ORS;  Service: Neurosurgery;  Laterality: Bilateral;  Bilateral deep brain stimulator placement    Family History  Problem Relation Age of Onset  . Lung cancer Father   . Colon cancer Neg Hx   . Prostate cancer Neg Hx     Social History   Social History  . Marital status: Married    Spouse name: N/A  . Number of children: N/A  . Years of education: N/A   Occupational History  . disabled     2001,  PD  .      search and rescue helicopter   Social History Main Topics  . Smoking status: Never Smoker  . Smokeless tobacco: Former Systems developer  . Alcohol use 0.0 oz/week     Comment: occasional (twice per month)  . Drug use: No  . Sexual activity: No   Other Topics Concern  . 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  Prev search and rescue work   3 kids from prev relationship.    Married 2014   The PMH, PSH, Social History, Family History, Medications, and allergies have been reviewed in [facility], and have been updated if relevant.   Review of Systems  Musculoskeletal: Positive for arthralgias.  Neurological: Positive for weakness.  All other systems reviewed and are negative.      Objective:    BP 112/80   Pulse (!) 51   Ht 5\' 9"  (1.753 m)   SpO2 98%    Physical Exam  Constitutional: He is oriented to person, place, and time. He appears well-developed.  In wheelchair, pleasant, obvious discomfort when he moves his right arm or shoulder  HENT:  Head: Normocephalic and atraumatic.  Eyes: Conjunctivae are normal.  Cardiovascular: Normal rate.   Pulmonary/Chest: Effort normal.  Musculoskeletal:       Right shoulder: He exhibits tenderness and bony tenderness.       Right upper arm: He exhibits tenderness, bony tenderness and swelling.  Neurological: He is alert and oriented to person, place, and time. No cranial nerve deficit.  Skin: Skin is warm and dry.  Psychiatric: He has a normal mood and affect. His behavior is normal. Judgment and thought content normal.          Assessment & Plan:   Arm injury, right, initial encounter - Plan: DG Humerus Right, DG Shoulder Right No Follow-up on file.

## 2016-11-15 NOTE — Assessment & Plan Note (Signed)
Possible fracture vs tendonitis. Xrays today. May need ortho referral.  Advised using wheelchair at home, awaiting hospice home safety evaluation.

## 2016-11-16 DIAGNOSIS — M109 Gout, unspecified: Secondary | ICD-10-CM | POA: Diagnosis not present

## 2016-11-16 DIAGNOSIS — F329 Major depressive disorder, single episode, unspecified: Secondary | ICD-10-CM | POA: Diagnosis not present

## 2016-11-16 DIAGNOSIS — G4733 Obstructive sleep apnea (adult) (pediatric): Secondary | ICD-10-CM | POA: Diagnosis not present

## 2016-11-16 DIAGNOSIS — M48062 Spinal stenosis, lumbar region with neurogenic claudication: Secondary | ICD-10-CM | POA: Diagnosis not present

## 2016-11-16 DIAGNOSIS — F4310 Post-traumatic stress disorder, unspecified: Secondary | ICD-10-CM | POA: Diagnosis not present

## 2016-11-16 DIAGNOSIS — M6281 Muscle weakness (generalized): Secondary | ICD-10-CM | POA: Diagnosis not present

## 2016-11-16 DIAGNOSIS — I10 Essential (primary) hypertension: Secondary | ICD-10-CM | POA: Diagnosis not present

## 2016-11-16 DIAGNOSIS — M720 Palmar fascial fibromatosis [Dupuytren]: Secondary | ICD-10-CM | POA: Diagnosis not present

## 2016-11-16 DIAGNOSIS — G20 Parkinson's disease: Secondary | ICD-10-CM | POA: Diagnosis not present

## 2016-11-17 ENCOUNTER — Telehealth: Payer: Self-pay

## 2016-11-17 NOTE — Telephone Encounter (Signed)
We don't have good options here other than sling, ice, naproxen.  I would expect him to have been more sore a few days after the fall.  The other option is to have him see ortho- are they interested in that?  How bad is the pain?

## 2016-11-17 NOTE — Telephone Encounter (Signed)
Mrs [name] (DPR signed) left v/m; pt was seen 11/15/16 with shoulder and rt arm pain; pain is worse. Per imaging report pt to f/u with PCP. Mrs [name] request cb. Medicap

## 2016-11-17 NOTE — Telephone Encounter (Signed)
Wife advised. 

## 2016-11-17 NOTE — Telephone Encounter (Signed)
Wife advised.  They will call if they want to see ortho.  Wife says they have been using the ice and Naproxen and two hours later, he was still complaining of the pain so she gave him ?oxycodone from a previous Rx.  That has helped the pain some but she doesn't know if she will have enough to last until his appt for his injection.

## 2016-11-17 NOTE — Telephone Encounter (Signed)
He had hallucinations with morphine.  See if he can tolerate the med they used and update [place] tomorrow.  Thanks.

## 2016-11-18 DIAGNOSIS — G4733 Obstructive sleep apnea (adult) (pediatric): Secondary | ICD-10-CM | POA: Diagnosis not present

## 2016-11-18 DIAGNOSIS — I10 Essential (primary) hypertension: Secondary | ICD-10-CM | POA: Diagnosis not present

## 2016-11-18 DIAGNOSIS — M109 Gout, unspecified: Secondary | ICD-10-CM | POA: Diagnosis not present

## 2016-11-18 DIAGNOSIS — M48062 Spinal stenosis, lumbar region with neurogenic claudication: Secondary | ICD-10-CM | POA: Diagnosis not present

## 2016-11-18 DIAGNOSIS — M6281 Muscle weakness (generalized): Secondary | ICD-10-CM | POA: Diagnosis not present

## 2016-11-18 DIAGNOSIS — F329 Major depressive disorder, single episode, unspecified: Secondary | ICD-10-CM | POA: Diagnosis not present

## 2016-11-18 DIAGNOSIS — F4310 Post-traumatic stress disorder, unspecified: Secondary | ICD-10-CM | POA: Diagnosis not present

## 2016-11-18 DIAGNOSIS — G20 Parkinson's disease: Secondary | ICD-10-CM | POA: Diagnosis not present

## 2016-11-18 DIAGNOSIS — M720 Palmar fascial fibromatosis [Dupuytren]: Secondary | ICD-10-CM | POA: Diagnosis not present

## 2016-11-21 ENCOUNTER — Encounter: Payer: Self-pay | Admitting: Family Medicine

## 2016-11-21 DIAGNOSIS — G20 Parkinson's disease: Secondary | ICD-10-CM | POA: Diagnosis not present

## 2016-11-21 DIAGNOSIS — Z515 Encounter for palliative care: Secondary | ICD-10-CM | POA: Diagnosis not present

## 2016-11-21 DIAGNOSIS — R296 Repeated falls: Secondary | ICD-10-CM | POA: Diagnosis not present

## 2016-11-21 DIAGNOSIS — R2689 Other abnormalities of gait and mobility: Secondary | ICD-10-CM | POA: Diagnosis not present

## 2016-11-22 ENCOUNTER — Other Ambulatory Visit: Payer: Self-pay | Admitting: Family Medicine

## 2016-11-22 MED ORDER — GABAPENTIN 100 MG PO CAPS: 100.0000 mg | ORAL_CAPSULE | Freq: Three times a day (TID) | ORAL | 0 refills | Status: DC | PRN

## 2016-11-25 DIAGNOSIS — I10 Essential (primary) hypertension: Secondary | ICD-10-CM | POA: Diagnosis not present

## 2016-11-25 DIAGNOSIS — M5412 Radiculopathy, cervical region: Secondary | ICD-10-CM | POA: Diagnosis not present

## 2016-11-30 DIAGNOSIS — R030 Elevated blood-pressure reading, without diagnosis of hypertension: Secondary | ICD-10-CM | POA: Diagnosis not present

## 2016-11-30 DIAGNOSIS — M5412 Radiculopathy, cervical region: Secondary | ICD-10-CM | POA: Diagnosis not present

## 2016-12-01 ENCOUNTER — Ambulatory Visit (INDEPENDENT_AMBULATORY_CARE_PROVIDER_SITE_OTHER): Payer: Medicare PPO | Admitting: Psychology

## 2016-12-01 DIAGNOSIS — F4321 Adjustment disorder with depressed mood: Secondary | ICD-10-CM | POA: Diagnosis not present

## 2016-12-02 ENCOUNTER — Telehealth: Payer: Self-pay | Admitting: Neurology

## 2016-12-02 ENCOUNTER — Encounter: Payer: Self-pay | Admitting: Neurology

## 2016-12-02 ENCOUNTER — Ambulatory Visit (INDEPENDENT_AMBULATORY_CARE_PROVIDER_SITE_OTHER): Payer: Medicare PPO | Admitting: Neurology

## 2016-12-02 VITALS — BP 132/76 | HR 52

## 2016-12-02 DIAGNOSIS — R441 Visual hallucinations: Secondary | ICD-10-CM | POA: Diagnosis not present

## 2016-12-02 DIAGNOSIS — Z9689 Presence of other specified functional implants: Secondary | ICD-10-CM | POA: Diagnosis not present

## 2016-12-02 DIAGNOSIS — F0280 Dementia in other diseases classified elsewhere without behavioral disturbance: Secondary | ICD-10-CM

## 2016-12-02 DIAGNOSIS — G20 Parkinson's disease: Secondary | ICD-10-CM | POA: Diagnosis not present

## 2016-12-02 MED ORDER — MEMANTINE HCL 10 MG PO TABS: 10.0000 mg | ORAL_TABLET | Freq: Two times a day (BID) | ORAL | 2 refills | Status: DC

## 2016-12-02 MED ORDER — MEMANTINE HCL 28 X 5 MG & 21 X 10 MG PO TABS: ORAL_TABLET | ORAL | 0 refills | Status: DC

## 2016-12-02 NOTE — Progress Notes (Signed)
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   Social History Main Topics  . Smoking status: Never Smoker  . Smokeless tobacco: Former Systems developer  . Alcohol use 0.0 oz/week     Comment: occasional (twice per month)  . Drug use: No  . Sexual activity: No   Other Topics Concern  . 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  Prev search and rescue work   3 kids from prev relationship.    Married 2014    FAMILY HISTORY:   Family Status  Relation Status  . Mother Alive       Arthritis, unknown medical hx  . Father Deceased       Lung Cancer  . Sister Alive       1 and one half sister  . Brother Alive  . Son Alive  . Daughter Alive       2  . Neg Hx (Not Specified)    ROS:  A complete 10 system review of systems was obtained and was unremarkable apart from what is mentioned above.  PHYSICAL EXAMINATION:    VITALS:   Vitals:   12/02/16 1442  BP: 132/76  Pulse: (!) 52  SpO2: 96%   Wt Readings from Last 3 Encounters:  11/02/16 179 lb 9.6 oz (81.5 kg)  10/25/16 181 lb 8  oz (82.3 kg)  10/21/16 179 lb (81.2 kg)     GEN:  The patient appears stated age and is in NAD.   More jovial and interactive HEENT:  Normocephalic, atraumatic.  The mucous membranes are moist. The superficial temporal arteries are without ropiness or tenderness. Cardiovascular: bradycardic.  Regular rhythym. Lungs: Clear to auscultation  bilaterally   Neurological examination:  Orientation: The patient is alert and oriented x3.  He is awake  Cranial nerves: There is good facial symmetry.  The speech is fluent but dysarthric but better than last several visits.    Soft palate rises symmetrically and there is no tongue deviation. Hearing is intact to conversational tone. Sensation: Sensation is intact to light touch throughout. Motor: Strength is 5/5 in the bilateral upper and lower extremities.   Shoulder shrug is equal and symmetric.  There is no pronator drift. Deep tendon reflexes: Deferred today.  Movement examination: Tone: There is normal tone today Abnormal movements: There is rare tremor when he holds the L arm above the head.  There is no dyskinesia today. Coordination: There is good RAMs today Gait and Station: deferred.  In wheelchair  DBS programming was performed today which is described in more detail on a separate programming procedural notes.   ASSESSMENT/PLAN:  1.  Parkinsons disease by hx.   -The patient is status post bilateral STN DBS on 12/06/2013 with battery placement on 12/13/2013.  -off of mirapex and selegeline  -continue carbidopa/levodopa 25/100, 2/1/1.     -talked about electric/motorized wheelchair which patient would like to try.  I talked to patient and wife about mental status and I do think that he would be able to operate this  -RX for PT/OT.  Had significant deconditioning.   2.  LBP.   -Status post repeat L4-L5 laminotomy with foraminotomy and L1-L2 laminectomy on 06/13/2014.   3.  Dysphagia  - He did have a modified barium swallow on 08/07/2015.  It demonstrated mild oral pharyngeal dysphagia (an improvement from 2015 examination) and a regular diet with thin liquids was recommended.  4.  OSAS with excessive daytime hypersomnolence.  -We talked about morbidity and mortality associated with untreated sleep apnea.  Sleep apnea appeared overall very mild (I believe AHI 10).  He was  not able to tolerate the cpap despite trying multiple masks.   More recently he saw Dr. Annamaria Boots and a split-night repeat study is planned. 5.  Depression  -on Wellbutrin XL, 450 mg and lexapro and doing better.  Is doing better in this regard  -felt near suicidal on klonopin 6.  Early Hallucinations  -decided to hold on further meds right now.  Most hallucination inanimate objects that he knows are not real  -Took Nuplazid but felt didn't help.  He was on it for about 7 weeks before it was discontinued. 7.  RBD  -Still acting out the dreams some, but felt almost near suicidal on clonazepam.  Bedroom safety discussed.  Seeing Dr. Annamaria Boots 8. Bradycardia  -cardiology following.  Cardiology doesn't think that this is etiology for fatigue 9.  Parkinsons disease dementia  -start namenda and work to 10 mg bid.  Risks, benefits, side effects and alternative therapies were discussed.  The opportunity to ask questions was given and they were answered to the best of my ability.  The patient expressed understanding and willingness to follow the outlined treatment protocols. 10.  Follow-up in the next 5-6 months, sooner should new neurologic issues arise.  Much greater than 50% of  this visit was spent in counseling and coordinating care.  Total face to face time:  35 min

## 2016-12-02 NOTE — Telephone Encounter (Signed)
Will give to wife when she picks up [facility] forms.

## 2016-12-02 NOTE — Procedures (Signed)
DBS Programming was performed.    Total time spent programming was 15 minutes.  Device was confirmed to be on.  Soft start was confirmed to be on at 8.  Impedences were checked and were within normal limits.  Battery was checked and was determined to be functioning normally and not near the end of life.   This was all documented on a separate neurophysiologic worksheet.  Final settings were as follows:  Left brain electrode:     1-C+           ; Amplitude  4.0   V   ; Pulse width 90 microseconds;   Frequency   130   Hz.  Right brain electrode:     2-C+          ; Amplitude   4.3  V ;  Pulse width 60  microseconds;  Frequency   130    Hz.

## 2016-12-02 NOTE — Telephone Encounter (Signed)
-----   Message from [place], DO sent at 12/02/2016  3:50 PM EDT ----- Please send to wife ----- Message ----- From: Kandis Nab, PsyD Sent: 12/02/2016   3:30 PM To: Ludwig Clarks, DO  Sure thing.  ----- Message ----- From: Ludwig Clarks, DO Sent: 12/02/2016   3:05 PM To: Kandis Nab, PsyD  Patients wife would like full neurocognitive report.  Can we send to them?

## 2016-12-02 NOTE — Patient Instructions (Signed)
1. We will send referral to [facility] for physical and occupational therapies and wheelchair evaluation.   2. Will send Namenda titration pack. Final dose will be 10 mg twice daily.

## 2016-12-06 DIAGNOSIS — G4733 Obstructive sleep apnea (adult) (pediatric): Secondary | ICD-10-CM | POA: Diagnosis not present

## 2016-12-06 DIAGNOSIS — G20 Parkinson's disease: Secondary | ICD-10-CM | POA: Diagnosis not present

## 2016-12-06 DIAGNOSIS — M109 Gout, unspecified: Secondary | ICD-10-CM | POA: Diagnosis not present

## 2016-12-06 DIAGNOSIS — F4310 Post-traumatic stress disorder, unspecified: Secondary | ICD-10-CM | POA: Diagnosis not present

## 2016-12-06 DIAGNOSIS — I10 Essential (primary) hypertension: Secondary | ICD-10-CM | POA: Diagnosis not present

## 2016-12-06 DIAGNOSIS — F0280 Dementia in other diseases classified elsewhere without behavioral disturbance: Secondary | ICD-10-CM | POA: Diagnosis not present

## 2016-12-06 DIAGNOSIS — R441 Visual hallucinations: Secondary | ICD-10-CM | POA: Diagnosis not present

## 2016-12-06 DIAGNOSIS — M48062 Spinal stenosis, lumbar region with neurogenic claudication: Secondary | ICD-10-CM | POA: Diagnosis not present

## 2016-12-06 DIAGNOSIS — F329 Major depressive disorder, single episode, unspecified: Secondary | ICD-10-CM | POA: Diagnosis not present

## 2016-12-08 ENCOUNTER — Telehealth: Payer: Self-pay | Admitting: Internal Medicine

## 2016-12-08 ENCOUNTER — Ambulatory Visit: Payer: Self-pay | Admitting: Neurology

## 2016-12-08 NOTE — Telephone Encounter (Signed)
Called [facility] and spoke with Judeen Hammans, representative Per Judeen Hammans the CPT code that they have is for a full PSG, advised that CY ordered a split night.  CPT code changed.  Judeen Hammans also stated that she will send this to one of their physicians for clinical review since pt has Parkinson's and the order specifically stated that he will need a one-on-one tech.  Sherry agreed that HST would not be appropriate.  Office number given because Judeen Hammans said this may require a peer-to-peer.  Split night is scheduled for 9.14.18  PCC's, is this something that clinical will need to follow up on?  Unsure.Marland KitchenMarland Kitchen

## 2016-12-09 ENCOUNTER — Telehealth: Payer: Self-pay

## 2016-12-09 DIAGNOSIS — I10 Essential (primary) hypertension: Secondary | ICD-10-CM | POA: Diagnosis not present

## 2016-12-09 DIAGNOSIS — R441 Visual hallucinations: Secondary | ICD-10-CM | POA: Diagnosis not present

## 2016-12-09 DIAGNOSIS — G4733 Obstructive sleep apnea (adult) (pediatric): Secondary | ICD-10-CM | POA: Diagnosis not present

## 2016-12-09 DIAGNOSIS — F329 Major depressive disorder, single episode, unspecified: Secondary | ICD-10-CM | POA: Diagnosis not present

## 2016-12-09 DIAGNOSIS — M109 Gout, unspecified: Secondary | ICD-10-CM | POA: Diagnosis not present

## 2016-12-09 DIAGNOSIS — M48062 Spinal stenosis, lumbar region with neurogenic claudication: Secondary | ICD-10-CM | POA: Diagnosis not present

## 2016-12-09 DIAGNOSIS — F0280 Dementia in other diseases classified elsewhere without behavioral disturbance: Secondary | ICD-10-CM | POA: Diagnosis not present

## 2016-12-09 DIAGNOSIS — F4310 Post-traumatic stress disorder, unspecified: Secondary | ICD-10-CM | POA: Diagnosis not present

## 2016-12-09 DIAGNOSIS — G20 Parkinson's disease: Secondary | ICD-10-CM | POA: Diagnosis not present

## 2016-12-09 NOTE — Telephone Encounter (Signed)
Nothing I know of [name]

## 2016-12-09 NOTE — Telephone Encounter (Signed)
Rowe Clack OT with [facility] left v/m re; evaluation for [facility] OT; the plan is to do eval week of 12/12/16 due to pt and his wife having an anniversary celebration this week. FYI to Dr Damita Dunnings; no cb needed.

## 2016-12-09 NOTE — Telephone Encounter (Signed)
Noted. Thanks.

## 2016-12-12 DIAGNOSIS — M109 Gout, unspecified: Secondary | ICD-10-CM | POA: Diagnosis not present

## 2016-12-12 DIAGNOSIS — G4733 Obstructive sleep apnea (adult) (pediatric): Secondary | ICD-10-CM | POA: Diagnosis not present

## 2016-12-12 DIAGNOSIS — G20 Parkinson's disease: Secondary | ICD-10-CM | POA: Diagnosis not present

## 2016-12-12 DIAGNOSIS — F4310 Post-traumatic stress disorder, unspecified: Secondary | ICD-10-CM | POA: Diagnosis not present

## 2016-12-12 DIAGNOSIS — R441 Visual hallucinations: Secondary | ICD-10-CM | POA: Diagnosis not present

## 2016-12-12 DIAGNOSIS — F329 Major depressive disorder, single episode, unspecified: Secondary | ICD-10-CM | POA: Diagnosis not present

## 2016-12-12 DIAGNOSIS — I10 Essential (primary) hypertension: Secondary | ICD-10-CM | POA: Diagnosis not present

## 2016-12-12 DIAGNOSIS — M48062 Spinal stenosis, lumbar region with neurogenic claudication: Secondary | ICD-10-CM | POA: Diagnosis not present

## 2016-12-12 DIAGNOSIS — F0280 Dementia in other diseases classified elsewhere without behavioral disturbance: Secondary | ICD-10-CM | POA: Diagnosis not present

## 2016-12-13 DIAGNOSIS — I10 Essential (primary) hypertension: Secondary | ICD-10-CM | POA: Diagnosis not present

## 2016-12-13 DIAGNOSIS — F329 Major depressive disorder, single episode, unspecified: Secondary | ICD-10-CM | POA: Diagnosis not present

## 2016-12-13 DIAGNOSIS — G20 Parkinson's disease: Secondary | ICD-10-CM | POA: Diagnosis not present

## 2016-12-13 DIAGNOSIS — R441 Visual hallucinations: Secondary | ICD-10-CM | POA: Diagnosis not present

## 2016-12-13 DIAGNOSIS — G4733 Obstructive sleep apnea (adult) (pediatric): Secondary | ICD-10-CM | POA: Diagnosis not present

## 2016-12-13 DIAGNOSIS — F0280 Dementia in other diseases classified elsewhere without behavioral disturbance: Secondary | ICD-10-CM | POA: Diagnosis not present

## 2016-12-13 DIAGNOSIS — M48062 Spinal stenosis, lumbar region with neurogenic claudication: Secondary | ICD-10-CM | POA: Diagnosis not present

## 2016-12-13 DIAGNOSIS — F4310 Post-traumatic stress disorder, unspecified: Secondary | ICD-10-CM | POA: Diagnosis not present

## 2016-12-13 DIAGNOSIS — M109 Gout, unspecified: Secondary | ICD-10-CM | POA: Diagnosis not present

## 2016-12-14 ENCOUNTER — Ambulatory Visit (INDEPENDENT_AMBULATORY_CARE_PROVIDER_SITE_OTHER): Payer: Medicare PPO | Admitting: Psychology

## 2016-12-14 DIAGNOSIS — F4323 Adjustment disorder with mixed anxiety and depressed mood: Secondary | ICD-10-CM | POA: Diagnosis not present

## 2016-12-15 ENCOUNTER — Ambulatory Visit: Payer: Medicare PPO | Admitting: Psychology

## 2016-12-16 DIAGNOSIS — I10 Essential (primary) hypertension: Secondary | ICD-10-CM | POA: Diagnosis not present

## 2016-12-16 DIAGNOSIS — F0280 Dementia in other diseases classified elsewhere without behavioral disturbance: Secondary | ICD-10-CM | POA: Diagnosis not present

## 2016-12-16 DIAGNOSIS — R441 Visual hallucinations: Secondary | ICD-10-CM | POA: Diagnosis not present

## 2016-12-16 DIAGNOSIS — G20 Parkinson's disease: Secondary | ICD-10-CM | POA: Diagnosis not present

## 2016-12-16 DIAGNOSIS — G4733 Obstructive sleep apnea (adult) (pediatric): Secondary | ICD-10-CM | POA: Diagnosis not present

## 2016-12-16 DIAGNOSIS — F4310 Post-traumatic stress disorder, unspecified: Secondary | ICD-10-CM | POA: Diagnosis not present

## 2016-12-16 DIAGNOSIS — M48062 Spinal stenosis, lumbar region with neurogenic claudication: Secondary | ICD-10-CM | POA: Diagnosis not present

## 2016-12-16 DIAGNOSIS — F329 Major depressive disorder, single episode, unspecified: Secondary | ICD-10-CM | POA: Diagnosis not present

## 2016-12-16 DIAGNOSIS — M109 Gout, unspecified: Secondary | ICD-10-CM | POA: Diagnosis not present

## 2016-12-20 DIAGNOSIS — G4733 Obstructive sleep apnea (adult) (pediatric): Secondary | ICD-10-CM | POA: Diagnosis not present

## 2016-12-20 DIAGNOSIS — F0280 Dementia in other diseases classified elsewhere without behavioral disturbance: Secondary | ICD-10-CM | POA: Diagnosis not present

## 2016-12-20 DIAGNOSIS — F4310 Post-traumatic stress disorder, unspecified: Secondary | ICD-10-CM | POA: Diagnosis not present

## 2016-12-20 DIAGNOSIS — M48062 Spinal stenosis, lumbar region with neurogenic claudication: Secondary | ICD-10-CM | POA: Diagnosis not present

## 2016-12-20 DIAGNOSIS — R441 Visual hallucinations: Secondary | ICD-10-CM | POA: Diagnosis not present

## 2016-12-20 DIAGNOSIS — F329 Major depressive disorder, single episode, unspecified: Secondary | ICD-10-CM | POA: Diagnosis not present

## 2016-12-20 DIAGNOSIS — M109 Gout, unspecified: Secondary | ICD-10-CM | POA: Diagnosis not present

## 2016-12-20 DIAGNOSIS — I10 Essential (primary) hypertension: Secondary | ICD-10-CM | POA: Diagnosis not present

## 2016-12-20 DIAGNOSIS — G20 Parkinson's disease: Secondary | ICD-10-CM | POA: Diagnosis not present

## 2016-12-21 DIAGNOSIS — G20 Parkinson's disease: Secondary | ICD-10-CM | POA: Diagnosis not present

## 2016-12-21 DIAGNOSIS — M109 Gout, unspecified: Secondary | ICD-10-CM | POA: Diagnosis not present

## 2016-12-21 DIAGNOSIS — M48062 Spinal stenosis, lumbar region with neurogenic claudication: Secondary | ICD-10-CM | POA: Diagnosis not present

## 2016-12-21 DIAGNOSIS — F0280 Dementia in other diseases classified elsewhere without behavioral disturbance: Secondary | ICD-10-CM | POA: Diagnosis not present

## 2016-12-21 DIAGNOSIS — F4310 Post-traumatic stress disorder, unspecified: Secondary | ICD-10-CM | POA: Diagnosis not present

## 2016-12-21 DIAGNOSIS — G4733 Obstructive sleep apnea (adult) (pediatric): Secondary | ICD-10-CM | POA: Diagnosis not present

## 2016-12-21 DIAGNOSIS — I10 Essential (primary) hypertension: Secondary | ICD-10-CM | POA: Diagnosis not present

## 2016-12-21 DIAGNOSIS — R441 Visual hallucinations: Secondary | ICD-10-CM | POA: Diagnosis not present

## 2016-12-21 DIAGNOSIS — F329 Major depressive disorder, single episode, unspecified: Secondary | ICD-10-CM | POA: Diagnosis not present

## 2016-12-22 DIAGNOSIS — I10 Essential (primary) hypertension: Secondary | ICD-10-CM | POA: Diagnosis not present

## 2016-12-22 DIAGNOSIS — M109 Gout, unspecified: Secondary | ICD-10-CM | POA: Diagnosis not present

## 2016-12-22 DIAGNOSIS — F329 Major depressive disorder, single episode, unspecified: Secondary | ICD-10-CM | POA: Diagnosis not present

## 2016-12-22 DIAGNOSIS — G20 Parkinson's disease: Secondary | ICD-10-CM | POA: Diagnosis not present

## 2016-12-22 DIAGNOSIS — F0280 Dementia in other diseases classified elsewhere without behavioral disturbance: Secondary | ICD-10-CM | POA: Diagnosis not present

## 2016-12-22 DIAGNOSIS — F4310 Post-traumatic stress disorder, unspecified: Secondary | ICD-10-CM | POA: Diagnosis not present

## 2016-12-22 DIAGNOSIS — G4733 Obstructive sleep apnea (adult) (pediatric): Secondary | ICD-10-CM | POA: Diagnosis not present

## 2016-12-22 DIAGNOSIS — M48062 Spinal stenosis, lumbar region with neurogenic claudication: Secondary | ICD-10-CM | POA: Diagnosis not present

## 2016-12-22 DIAGNOSIS — R441 Visual hallucinations: Secondary | ICD-10-CM | POA: Diagnosis not present

## 2016-12-23 DIAGNOSIS — M109 Gout, unspecified: Secondary | ICD-10-CM | POA: Diagnosis not present

## 2016-12-23 DIAGNOSIS — R441 Visual hallucinations: Secondary | ICD-10-CM | POA: Diagnosis not present

## 2016-12-23 DIAGNOSIS — F4310 Post-traumatic stress disorder, unspecified: Secondary | ICD-10-CM | POA: Diagnosis not present

## 2016-12-23 DIAGNOSIS — G4733 Obstructive sleep apnea (adult) (pediatric): Secondary | ICD-10-CM | POA: Diagnosis not present

## 2016-12-23 DIAGNOSIS — G20 Parkinson's disease: Secondary | ICD-10-CM | POA: Diagnosis not present

## 2016-12-23 DIAGNOSIS — I10 Essential (primary) hypertension: Secondary | ICD-10-CM | POA: Diagnosis not present

## 2016-12-23 DIAGNOSIS — M48062 Spinal stenosis, lumbar region with neurogenic claudication: Secondary | ICD-10-CM | POA: Diagnosis not present

## 2016-12-23 DIAGNOSIS — F329 Major depressive disorder, single episode, unspecified: Secondary | ICD-10-CM | POA: Diagnosis not present

## 2016-12-23 DIAGNOSIS — F0280 Dementia in other diseases classified elsewhere without behavioral disturbance: Secondary | ICD-10-CM | POA: Diagnosis not present

## 2016-12-24 ENCOUNTER — Encounter: Payer: Self-pay | Admitting: Neurology

## 2016-12-24 IMAGING — CT CT LUMBAR SPINE WITHOUT CONTRAST
3 of 9 series · 12 of 33 positions shown, 14 images · non-contrast
Comparison: None.

CLINICAL DATA: Low back pain and right leg pain, progressive over
the last 6 months.

EXAM:
CT LUMBAR SPINE WITHOUT CONTRAST
TECHNIQUE: Multidetector CT imaging of the lumbar spine was performed without
intravenous contrast administration. Multiplanar CT image
reconstructions were also generated.

[Series 7: l spine soft · axial · 0.38mm/px · z∈[-825,-645]mm · 4 of 126 slices shown, 5 images]
[im 18/126  soft-tissue]
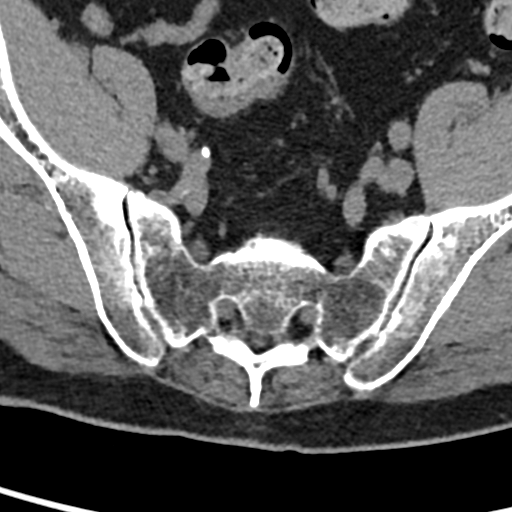
[im 18/126  bone]
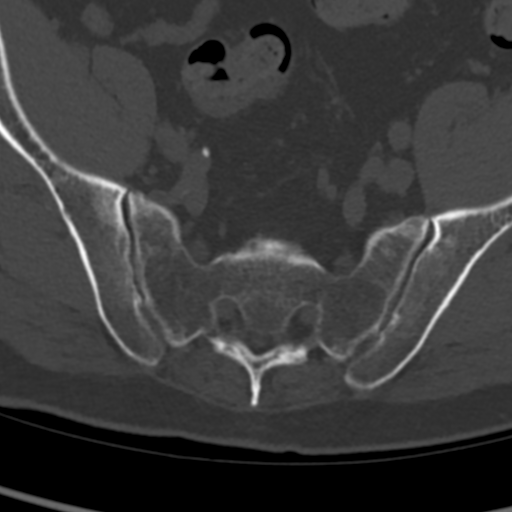
[im 54/126  bone]
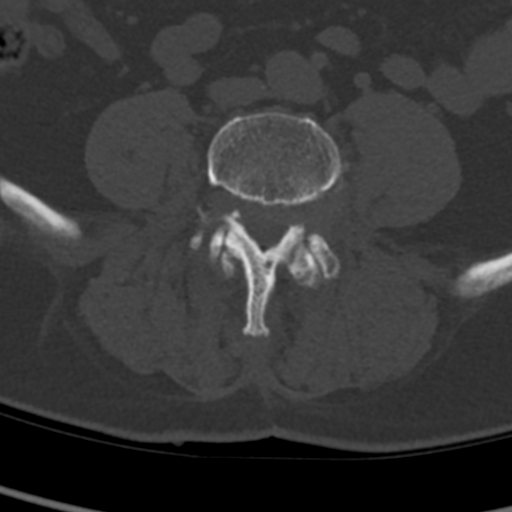
[im 72/126  bone]
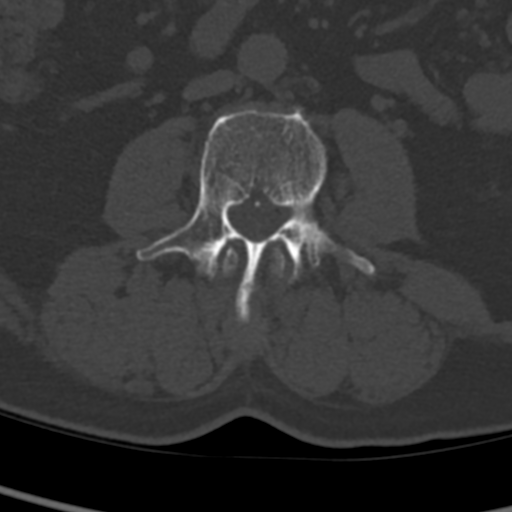
[im 108/126  bone]
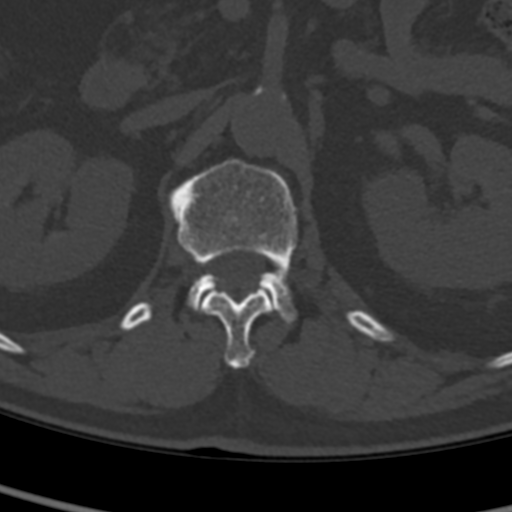

[Series 8: sag bone · sagittal · 0.31mm/px · 5 of 80 slices shown, 6 images]
[im 27/80  bone]
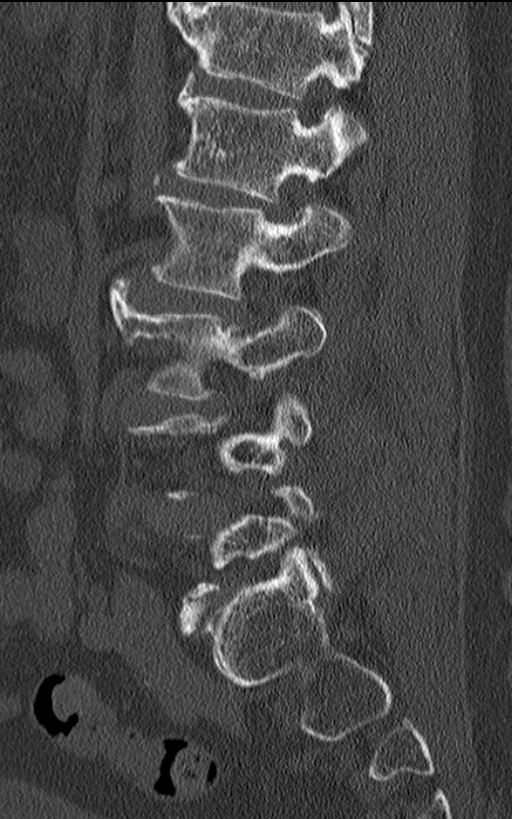
[im 33/80  bone]
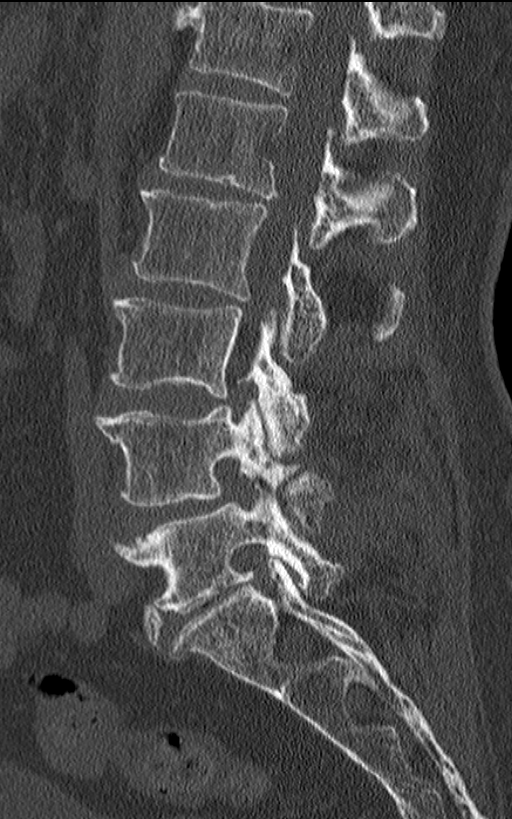
[im 40/80  soft-tissue]
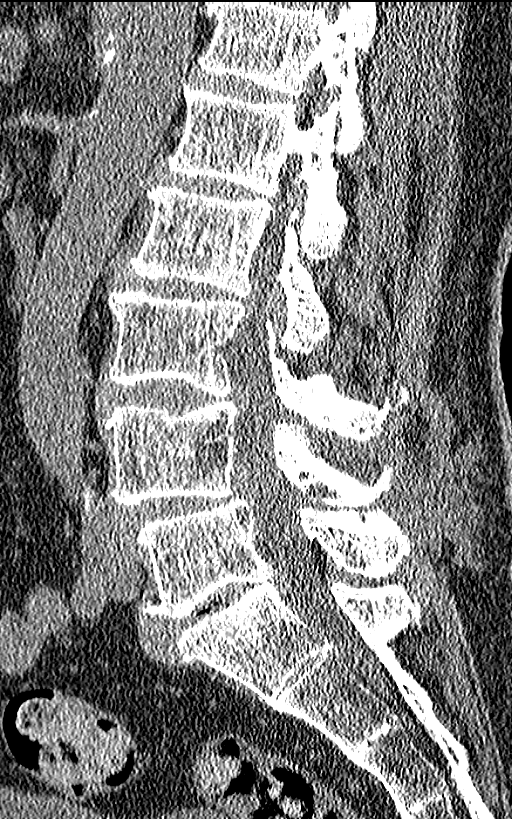
[im 40/80  bone]
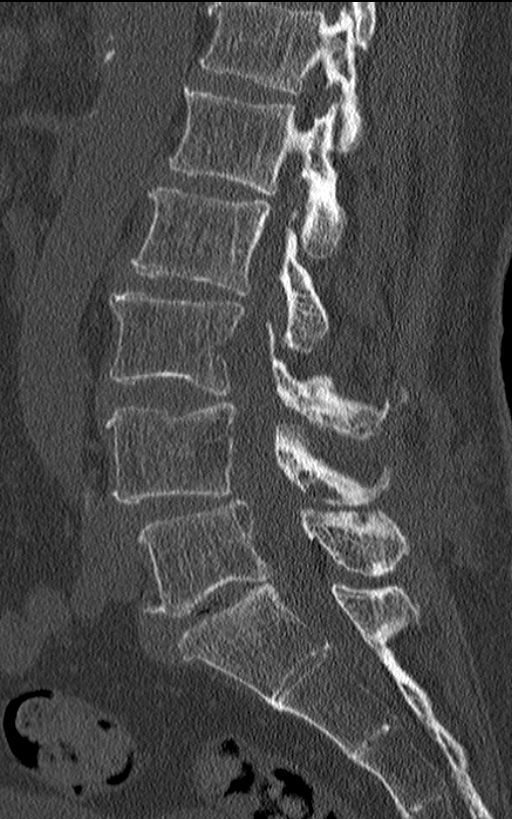
[im 47/80  bone]
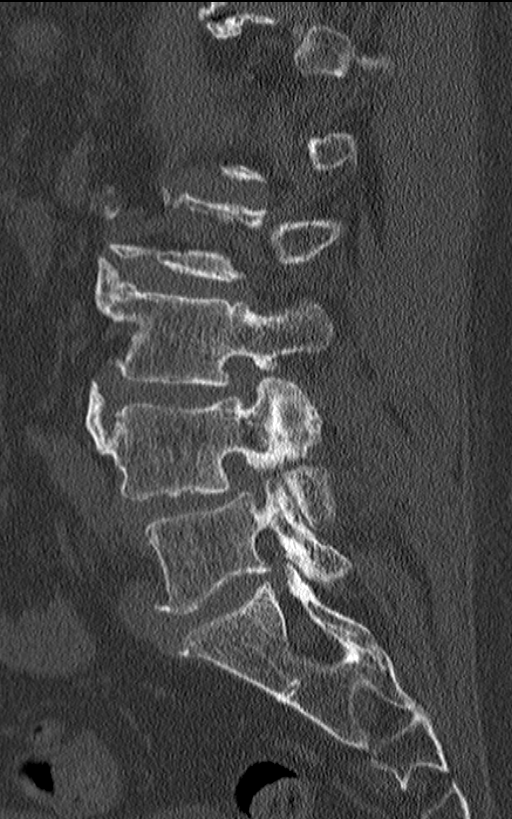
[im 53/80  bone]
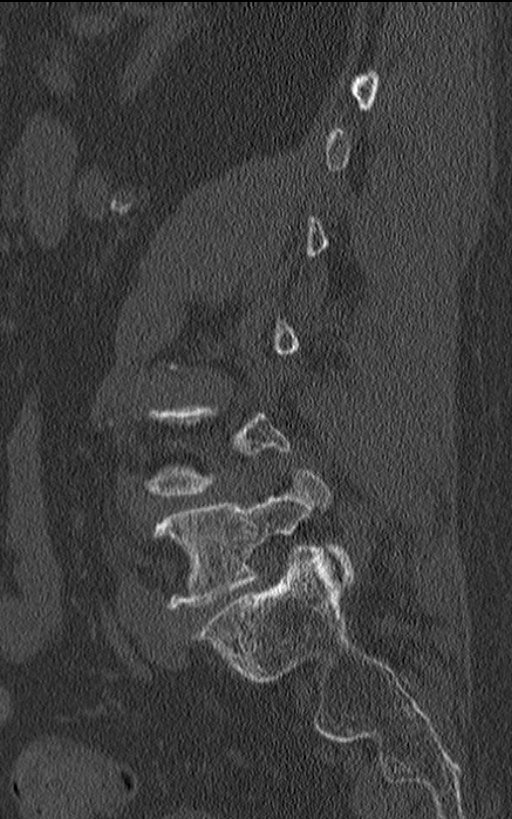

[Series 9: cor bone · coronal · 0.37mm/px · 3 of 78 slices shown]
[im 16/78  bone]
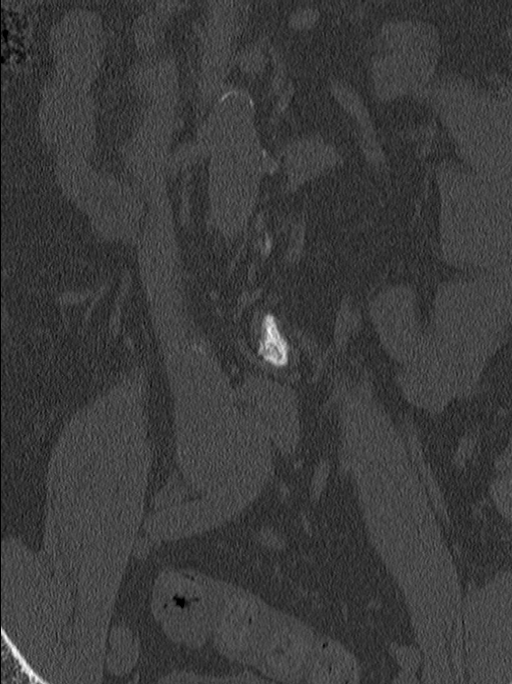
[im 31/78  bone]
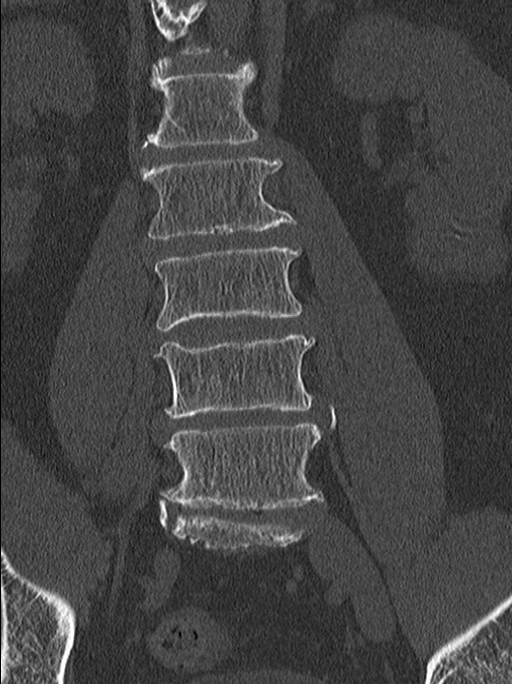
[im 47/78  bone]
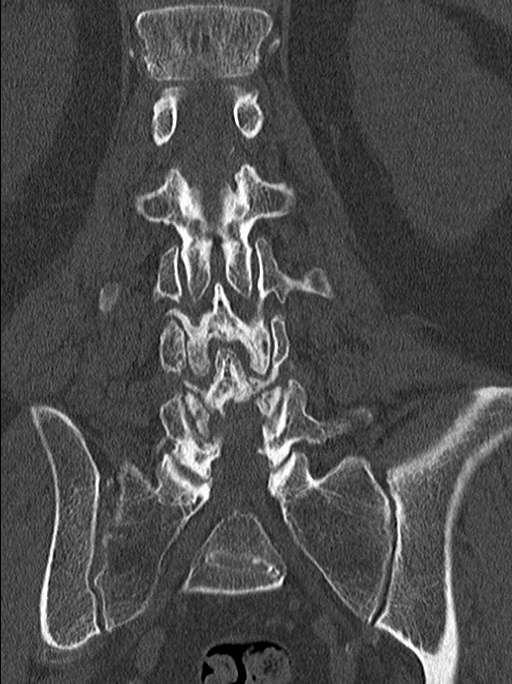

[12 of 33 positions shown; findings below may reference images not displayed]

FINDINGS: T12-L1: Normal disc. Slight calcification of the ligamentum flavum.

L1-2: Slight disc space narrowing. Broad-based disc bulge asymmetric
into the right lateral recess. Hypertrophy of the ligamentum flavum
narrow spinal canal and the right lateral recess which could affect
the right L2 nerve.

L2-3: Disc space narrowing. Small broad-based disc bulge without
focal neural impingement. Slight compression of the thecal sac.

L3-4: 2 mm spondylolisthesis. Small broad-based soft disc
protrusion. Slight hypertrophy of the ligamentum flavum and facet
joints. Left foraminal and extra foraminal disc protrusion.
Compression of the thecal sac with moderately severe spinal
stenosis.

L4-5: Bilateral pars defects. 5 mm spondylolisthesis disc space
narrowing. Broad-based protrusion of the uncovered disc asymmetric
to the right severe compression of the thecal sac, asymmetric to the
right. The protrusion extends into both neural foramina and lateral
recesses. Previous left laminectomy. This should affect the L5 and
more distal nerve roots and could affect the L4 nerves bilaterally.

L5-S1: Disc space narrowing. Small broad-based disc protrusion with
a focal protrusion into the right neural foramen visible on image 3
series 607 which appears to compress the right L5 nerve in the
neural foramen.
IMPRESSION: 1. Severe spinal stenosis at L4-5 with a broad-based disc protrusion
and spondylolisthesis and bilateral pars defects.
2. Small focal foraminal disc protrusion at L5-S1 which compresses
the right L5 nerve.
3. Of moderately severe spinal stenosis at L3-4 due to a broad-based
disc protrusion and hypertrophy of the ligamentum flavum and facet
joints.

## 2016-12-26 ENCOUNTER — Encounter: Payer: Self-pay | Admitting: Neurology

## 2016-12-26 DIAGNOSIS — R441 Visual hallucinations: Secondary | ICD-10-CM | POA: Diagnosis not present

## 2016-12-26 DIAGNOSIS — F0280 Dementia in other diseases classified elsewhere without behavioral disturbance: Secondary | ICD-10-CM | POA: Diagnosis not present

## 2016-12-26 DIAGNOSIS — M109 Gout, unspecified: Secondary | ICD-10-CM | POA: Diagnosis not present

## 2016-12-26 DIAGNOSIS — F4310 Post-traumatic stress disorder, unspecified: Secondary | ICD-10-CM | POA: Diagnosis not present

## 2016-12-26 DIAGNOSIS — F329 Major depressive disorder, single episode, unspecified: Secondary | ICD-10-CM | POA: Diagnosis not present

## 2016-12-26 DIAGNOSIS — M48062 Spinal stenosis, lumbar region with neurogenic claudication: Secondary | ICD-10-CM | POA: Diagnosis not present

## 2016-12-26 DIAGNOSIS — G20 Parkinson's disease: Secondary | ICD-10-CM | POA: Diagnosis not present

## 2016-12-26 DIAGNOSIS — I10 Essential (primary) hypertension: Secondary | ICD-10-CM | POA: Diagnosis not present

## 2016-12-26 DIAGNOSIS — G4733 Obstructive sleep apnea (adult) (pediatric): Secondary | ICD-10-CM | POA: Diagnosis not present

## 2016-12-27 ENCOUNTER — Telehealth: Payer: Self-pay | Admitting: Neurology

## 2016-12-27 ENCOUNTER — Other Ambulatory Visit: Payer: Self-pay | Admitting: *Deleted

## 2016-12-27 ENCOUNTER — Encounter: Payer: Self-pay | Admitting: Neurology

## 2016-12-27 MED ORDER — CARBIDOPA-LEVODOPA 25-100 MG PO TABS: 1.0000 | ORAL_TABLET | ORAL | 5 refills | Status: DC

## 2016-12-27 NOTE — Telephone Encounter (Signed)
[facility] called and wanted to know if the [facility] for pt's plan of care has been signed and sent back [phone]

## 2016-12-28 ENCOUNTER — Ambulatory Visit (HOSPITAL_BASED_OUTPATIENT_CLINIC_OR_DEPARTMENT_OTHER): Payer: Medicare PPO | Admitting: Internal Medicine

## 2016-12-28 VITALS — Ht 69.0 in | Wt 180.0 lb

## 2016-12-28 DIAGNOSIS — F4310 Post-traumatic stress disorder, unspecified: Secondary | ICD-10-CM | POA: Diagnosis not present

## 2016-12-28 DIAGNOSIS — F0280 Dementia in other diseases classified elsewhere without behavioral disturbance: Secondary | ICD-10-CM | POA: Diagnosis not present

## 2016-12-28 DIAGNOSIS — G4733 Obstructive sleep apnea (adult) (pediatric): Secondary | ICD-10-CM | POA: Diagnosis not present

## 2016-12-28 DIAGNOSIS — G20 Parkinson's disease: Secondary | ICD-10-CM | POA: Diagnosis not present

## 2016-12-28 DIAGNOSIS — F329 Major depressive disorder, single episode, unspecified: Secondary | ICD-10-CM | POA: Diagnosis not present

## 2016-12-28 DIAGNOSIS — I10 Essential (primary) hypertension: Secondary | ICD-10-CM | POA: Diagnosis not present

## 2016-12-28 DIAGNOSIS — M109 Gout, unspecified: Secondary | ICD-10-CM | POA: Diagnosis not present

## 2016-12-28 DIAGNOSIS — R441 Visual hallucinations: Secondary | ICD-10-CM | POA: Diagnosis not present

## 2016-12-28 DIAGNOSIS — M48062 Spinal stenosis, lumbar region with neurogenic claudication: Secondary | ICD-10-CM | POA: Diagnosis not present

## 2016-12-29 ENCOUNTER — Ambulatory Visit: Payer: Medicare PPO | Admitting: Psychology

## 2016-12-29 NOTE — Procedures (Signed)
Study was lost ,  during recording  with the  Computer shout down.

## 2016-12-30 ENCOUNTER — Encounter (HOSPITAL_BASED_OUTPATIENT_CLINIC_OR_DEPARTMENT_OTHER): Payer: Self-pay

## 2016-12-30 NOTE — Telephone Encounter (Signed)
[name] was calling back to check Status. She will re fax. Please Advise. Thanks

## 2017-01-01 ENCOUNTER — Ambulatory Visit (HOSPITAL_BASED_OUTPATIENT_CLINIC_OR_DEPARTMENT_OTHER): Payer: Medicare PPO

## 2017-01-02 NOTE — Telephone Encounter (Signed)
Dr. Tomi Likens signed for Dr. Carles Collet and this was faxed back to [facility] at [phone] with confirmation received.

## 2017-01-08 ENCOUNTER — Other Ambulatory Visit: Payer: Self-pay | Admitting: Family Medicine

## 2017-01-09 ENCOUNTER — Encounter (HOSPITAL_BASED_OUTPATIENT_CLINIC_OR_DEPARTMENT_OTHER): Payer: Medicare PPO

## 2017-01-09 ENCOUNTER — Encounter: Payer: Self-pay | Admitting: Neurology

## 2017-01-24 ENCOUNTER — Encounter: Payer: Self-pay | Admitting: Family Medicine

## 2017-01-24 ENCOUNTER — Encounter: Payer: Self-pay | Admitting: Neurology

## 2017-01-26 ENCOUNTER — Ambulatory Visit: Payer: Medicare PPO | Admitting: Psychology

## 2017-01-30 ENCOUNTER — Telehealth: Payer: Self-pay | Admitting: Neurology

## 2017-01-30 ENCOUNTER — Ambulatory Visit (INDEPENDENT_AMBULATORY_CARE_PROVIDER_SITE_OTHER): Payer: Medicare PPO | Admitting: Psychology

## 2017-01-30 ENCOUNTER — Telehealth: Payer: Self-pay | Admitting: Family Medicine

## 2017-01-30 DIAGNOSIS — F331 Major depressive disorder, recurrent, moderate: Secondary | ICD-10-CM

## 2017-01-30 DIAGNOSIS — G20 Parkinson's disease: Secondary | ICD-10-CM

## 2017-01-30 DIAGNOSIS — F4323 Adjustment disorder with mixed anxiety and depressed mood: Secondary | ICD-10-CM

## 2017-01-30 NOTE — Telephone Encounter (Signed)
Noted. Thanks.

## 2017-01-30 NOTE — Telephone Encounter (Signed)
They never picked up copies of [facility] forms form August. They want these left at the front, also want an order for PT for water therapy. Orders written at the front for pick up as requested.

## 2017-01-30 NOTE — Telephone Encounter (Signed)
Patient wife called and has some questions about patient' forms and his PT

## 2017-01-30 NOTE — Telephone Encounter (Signed)
Spoke to pt spouse. Gave her information for [facility] [phone]). Pt spouse will talk with pt and if he agrees she will call them to set up time for evaluation.

## 2017-02-01 ENCOUNTER — Ambulatory Visit: Payer: Medicare PPO | Attending: Neurology | Admitting: Physical Therapy

## 2017-02-01 DIAGNOSIS — R2689 Other abnormalities of gait and mobility: Secondary | ICD-10-CM | POA: Diagnosis not present

## 2017-02-01 DIAGNOSIS — R279 Unspecified lack of coordination: Secondary | ICD-10-CM | POA: Diagnosis not present

## 2017-02-01 DIAGNOSIS — R2681 Unsteadiness on feet: Secondary | ICD-10-CM

## 2017-02-01 NOTE — Therapy (Signed)
[facility] [address] [place], [place], [address] Phone: [phone]   Fax:  [phone]  Physical Therapy Evaluation  Patient Details  Name: [name] MRN: [id number] Date of Birth: March 20, 1951 Referring Provider: Alonza Bogus, DO  Encounter Date: 02/01/2017      PT End of Session - 02/01/17 2153    Visit Number 1   Number of Visits 1   PT Start Time 6553   PT Stop Time 1524   PT Time Calculation (min) 68 min      Past Medical History:  Diagnosis Date  . Arthritis   . Bradycardia   . Cancer [facility]) 2013   skin cancer  . Depression    ptsd  . Dysrhythmia    chronic slow heart rate  . GERD (gastroesophageal reflux disease)   . Headache(784.0)    tension headaches non recent  . History of chicken pox   . History of kidney stones   . Hypertension    treated with HCTZ  . Parkinson's disease ([place])    dx'ed 15 years ago  . PTSD (post-traumatic stress disorder)   . Shortness of breath dyspnea   . Sleep apnea    doesn't use C-pap  . Varicose veins     Past Surgical History:  Procedure Laterality Date  . CHOLECYSTECTOMY N/A 10/22/2014   Procedure: LAPAROSCOPIC CHOLECYSTECTOMY WITH INTRAOPERATIVE CHOLANGIOGRAM;  Surgeon: Dia Crawford III, MD;  Location: [facility] ORS;  Service: General;  Laterality: N/A;  . cyst removed      from lip as a child  . LUMBAR LAMINECTOMY/DECOMPRESSION MICRODISCECTOMY Bilateral 12/14/2012   Procedure: Bilateral lumbar three-four, four-five decompressive laminotomy/foraminotomy;  Surgeon: Charlie Pitter, MD;  Location: [place] NEURO ORS;  Service: Neurosurgery;  Laterality: Bilateral;  . PULSE GENERATOR IMPLANT Bilateral 12/13/2013   Procedure: Bilateral implantable pulse generator placement;  Surgeon: Erline Levine, MD;  Location: [place] [facility]UR[facility] ORS;  Service: Neurosurgery;  Laterality: Bilateral;  Bilateral implantable pulse generator placement  . skin cancer removed     from ears,   12 lft arm  rt  leg 15  . SUBTHALAMIC STIMULATOR INSERTION Bilateral 12/06/2013   Procedure: SUBTHALAMIC STIMULATOR INSERTION;  Surgeon: Erline Levine, MD;  Location: [place] NEURO ORS;  Service: Neurosurgery;  Laterality: Bilateral;  Bilateral deep brain stimulator placement    There were no vitals filed for this visit.       Subjective Assessment - 02/01/17 2150    Subjective pt presents for power wheelchair eval - using U step RW for assistance with amb.   Currently in Pain? No/denies            [facility] PT Assessment - 02/01/17 1458      Assessment   Medical Diagnosis Parkinson's Disease   Referring Provider George Guiles Tat, DO   Onset Date/Surgical Date --  approx. 2002     Precautions   Precautions Fall     Restrictions   Weight Bearing Restrictions No     Balance Screen   Has the patient fallen in the past 6 months Yes   How many times? 20+   Has the patient had a decrease in activity level because of a fear of falling?  Yes   Is the patient reluctant to leave their home because of a fear of falling?  Yes            Objective measurements completed on examination: See above findings.        Mobility/Seating Evaluation  PATIENT INFORMATION: Name: [name] DOB: [birth year]  Sex: M Date seen: 02-01-17 Time: 1445  Address:  [address]                 [place], [place] [address] Physician: Alonza Bogus, DO This evaluation/justification form will serve as the LMN for the following suppliers: __________________________ Supplier: [employer] Contact Person: [name], [name] Phone:  [phone]   Seating Therapist: Guido Mcgee, PT Phone:   [phone]   Phone: [phone]    Spouse/Parent/Caregiver name: [name]   Phone number: [phone] Insurance/Payer: [employer]     Reason for Referral: power wheelchair evaluation  Patient/Caregiver Goals: obtain power wheelchair  Patient was seen for face-to-face evaluation for new power wheelchair.  Also present was [employer] to discuss recommendations and wheelchair options.  Further paperwork was completed and sent to vendor.  Patient appears to qualify for power mobility device at this time per objective findings.   MEDICAL HISTORY: Diagnosis: Primary Diagnosis: Parkinson's Disease Onset: 2001 Diagnosis: ?????   _0 Progressive Disease Relevant past and future surgeries: subthalamic stimulator insertion and pulse generator implant August 2015 (s/p deep brain stimulator); Lumbar laminectomy/decompression Aug. 2014   Height: 5'9" Weight: 190# Explain recent changes or trends in weight: ?????   History including Falls: Pt reports having had approx. 15 falls within past month with most recent fall sustained yesterday (2 falls sustained yesterday); pt sustained a concussion in summer 2018 due to fall:  Pt has been using Ustep RW for 4  years    HOME ENVIRONMENT: _1 House  _2 Condo/town home  _3 Apartment  _4 Assisted Living    _5 Lives Alone _6  Lives with Others                                                                                          Hours with caregiver: ?????  _7 Home is accessible to patient           Stairs      _8 Yes _9  No     Ramp _10 Yes _11 No Comments:  ?????   COMMUNITY ADL: TRANSPORTATION: _12 Car    _13 Van    <CHYIFOYDXAJOINOM>_7<\/EHMCNOBSJGGEZMOQ>_94 Public Transportation    _15 Adapted w/c Lift    _16 Ambulance    _17 Other:       _18 Sits in wheelchair during transport  Employment/School: ????? Specific requirements pertaining to mobility ?????  Other: ?????    FUNCTIONAL/SENSORY PROCESSING SKILLS:  Handedness:   _19 Right     _20 Left    _21 NA  Comments:  ?????  Functional Processing Skills for Wheeled Mobility _22 Processing Skills are adequate for safe wheelchair operation  Areas of concern than may interfere with safe operation of wheelchair Description of problem   _23  Attention to environment      _24 Judgment      _25  Hearing  _26  Vision or visual processing      _27 Motor Planning  _28  Fluctuations in Behavior  ?????     VERBAL COMMUNICATION: _29 WFL receptive _30  WFL expressive _31 Understandable  _32 Difficult to understand  _33 non-communicative _34  Uses an augmented communication device  CURRENT SEATING / MOBILITY: Current Mobility Base:  _35 None _36 Dependent _37 Manual _38 Scooter _39 Power  Type of Control: ?????  Manufacturer:  ?????Size:  ?????Age: ?????  Current Condition of Mobility Base:  pt has a donated manual wheelchair   Current Wheelchair components:  ?????  Describe posture in present seating system:  ?????      SENSATION and SKIN ISSUES: Sensation _0 Intact  _1 Impaired _2 Absent  Level of sensation: ????? Pressure Relief: Able to perform effective pressure relief :    _3 Yes  _4  No Method: ???? If not, Why?: ?????  Skin Issues/Skin Integrity Current Skin Issues  _5 Yes _6 No _7 Intact _8  Red area_9  Open Area  _10 Scar Tissue _11 At risk from prolonged sitting Where  ?????  History of Skin Issues  _12 Yes _13 No Where  ????? When  ?????  Hx of skin flap surgeries  _14 Yes _15 No Where  ????? When  ?????  Limited sitting tolerance _16 Yes _17 No Hours spent sitting in wheelchair daily: ?????  Complaint of Pain:  Please describe: Pt reports pain in Rt side of neck, radiates down into shoulder and into Rt arm; rates pain 2-6/10 intensity   Swelling/Edema: None   ADL STATUS (in reference to wheelchair use):  Indep Assist Unable Indep with Equip Not assessed Comments  Dressing ????? X ????? ????? ????? gets dressed in seated position  Eating ????? X ????? ????? ????? needs assistance with cutting food  Toileting ????? X ????? ????? ????? needs occasional assistance with sit to stand transfer from toilet  Bathing ????? X ????? ????? ????? needs assistance with transfer for safety:  uses tub bench  Grooming/Hygiene ????? ????? ????? X ????? leans on counter or uses RW  Meal Prep ????? ????? X ????? ????? does light meal prep  IADLS ????? ????? X ????? ????? does not access community due to difficulty with  mobility  Bowel Management: _18 Continent  _19 Incontinent  _20 Accidents Comments:  ?????  Bladder Management: _21 Continent  _22 Incontinent  _23 Accidents Comments:  ?????     WHEELCHAIR SKILLS: Manual w/c Propulsion: _24 UE or LE strength and endurance sufficient to participate in ADLs using manual wheelchair Arm : _25 left _26 right   _27 Both      Distance: ????? Foot:  _28 left _29 right   _30 Both  Operate Scooter: _31  Strength, hand grip, balance and transfer appropriate for use _32 Living environment is accessible for use of scooter  Operate Power w/c:  _33  Std. Joystick   _34  Alternative Controls Indep _35  Assist _36  Dependent/unable _37  N/A _38   _39 Safe          _40  Functional      Distance: ?????  Bed confined without wheelchair _41  Yes _42  No   STRENGTH/RANGE OF MOTION:  Active Range of Motion Strength  Shoulder Rt shoulder flexion 146 degrees:  abduction 104 degrees:  Lt shoulder flexion 138 degrees: abduction 124 degrees 3-/5 bil. shoulder strength   Elbow WNL's bil. UE's WNL's bil. UE's   Wrist/Hand WNL's bil. UE's WNL's bil. UE's  Hip WNL's bil. LE's WNL's bil. LE's  Knee WNL's bil. LE's WNL's bil. LE's  Ankle WNL's bil. LE's WNL's bil. LE's     MOBILITY/BALANCE:  _43  Patient is totally dependent for mobility  ?????    Balance Transfers Ambulation  Sitting Balance: Standing Balance: _44  Independent _45  Independent/Modified Independent  _46  WFL     _47  WFL _48  Supervision _49  Supervision  _50  Uses UE for balance  _51  Supervision _52  Min Assist _53  Ambulates with Assist  ?????    _54  Min Assist _55  Min assist _56  Mod Assist _57  Ambulates with Device:      _58  RW  _59  StW  _60  Cane  _61  ?????  _62   Mod Assist _0  Mod assist _1  Max assist   _2  Max Assist _3  Max assist _4  Dependent _5  Indep. Short Distance Only  _6  Unable _7  Unable _8  Lift / Sling Required Distance (in feet)  ?????   _9  Sliding board _10  Unable to Ambulate (see explanation below)  Cardio Status:  _11 Intact  _12  Impaired   _13  NA     Bradycardia   Respiratory Status:  _14 Intact   _15 Impaired   _16 NA     Dyspnea; OSA  Orthotics/Prosthetics: ?????  Comments (Address manual vs power w/c vs scooter): Pt amb. with unsteady gait and with lateral trunk lean toward left side:  pt has decreased coordination in bil. UE's with ataxic gait pattern.  Pt is using U-Step RW but has sustained numerous falls with most recent falls (x 2) having occurred yesterday.  Pt is unable to functionally and effectively propel a manual wheelchair due to decreased coordination in bil. UE's with decr. AROM in bil. shoulders.  Pt is unable to operate a scooter due to inability to safely transfer on and off the platform and also due to inability to operate the tiller of a scooter due to decreased coordination in bil. UE's.  Pt's home environment is not accessible for a scooter with its large turning radius required for maneuverability.         Anterior / Posterior Obliquity Rotation-Pelvis ?????  PELVIS    _17  _18  _19   Neutral Posterior Anterior  _20  _21  _22   WFL Rt elev Lt elev  _23  _24  _25   WFL Right Left                      Anterior    Anterior     _26  Fixed _27  Other _28  Partly Flexible _29  Flexible   _30  Fixed _31  Other _32  Partly Flexible  _33  Flexible  _34  Fixed _35  Other _36  Partly Flexible  _37  Flexible   TRUNK  _38  _39  _40   WFL ? Thoracic ? Lumbar  Kyphosis Lordosis  _41  _42  _43   WFL Convex Convex  Right Left _44 c-curve _45 s-curve _46 multiple  _47  Neutral _48  Left-anterior _49  Right-anterior     _50  Fixed _51  Flexible _52  Partly Flexible _53  Other  _54  Fixed _55  Flexible _56  Partly Flexible _57  Other  _58  Fixed             _59  Flexible _60  Partly Flexible _61  Other    Position Windswept  ?????  HIPS          _62            _63               _64    Neutral       Abduct        ADduct         _65           _66            _67   Neutral Right           Left      _68  Fixed _69  Subluxed _70  Partly Flexible _71  Dislocated _72  Flexible  _73  Fixed _74  Other _75  Partly Flexible  _76   Flexible                 Foot Positioning Knee Positioning  ?????    _77  WFL  _78 Lt _79 Rt _80  WFL  _81 Lt _82 Rt    KNEES ROM concerns: ROM concerns:    & Dorsi-Flexed _83 Lt _84 Rt ?????    FEET  Plantar Flexed _0 Lt _1 Rt      Inversion                 _2 Lt _3 Rt      Eversion                 _4 Lt _5 Rt     HEAD _6  Functional _7  Good Head Control  ?????  & _8  Flexed         _9  Extended _10  Adequate Head Control    NECK _11  Rotated  Lt  _12  Lat Flexed Lt _13  Rotated  Rt _14  Lat Flexed Rt _15  Limited Head Control     _16  Cervical Hyperextension _17  Absent  Head Control     SHOULDERS ELBOWS WRIST& HAND ?????      Left     Right    Left     Right    Left     Right   U/E _18 Functional           _19 Functional WNL's WNL's _20 Fisting             _21 Fisting      _22 elev   _23 dep      _24 elev   _25 dep       _26 pro -_27 retract     _28 pro  _29 retract _30 subluxed             _31 subluxed           Goals for Wheelchair Mobility  _32  Independence with mobility in the home with motor related ADLs (MRADLs)  _33  Independence with MRADLs in the community _34  Provide dependent mobility  _35  Provide recline     _36 Provide tilt   Goals for Seating system _37  Optimize pressure distribution _38  Provide support needed to facilitate function or safety _39  Provide corrective forces to assist with maintaining or improving posture _40  Accommodate client's posture:   current seated postures and positions are not flexible or will not tolerate corrective forces _41  Client to be independent with relieving pressure in the wheelchair _42 Enhance physiological function such as breathing, swallowing, digestion  Simulation ideas/Equipment trials:????? State why other equipment was unsuccessful:?????   MOBILITY BASE RECOMMENDATIONS and JUSTIFICATION: MOBILITY COMPONENT JUSTIFICATION  Manufacturer: QuantumModel: Q6 Edge 2.0   Size: Width 18Seat Depth 20 _43 provide transport from point A to B      _44 promote Indep mobility  _45 is not a safe,  functional ambulator _46 walker or cane inadequate _47 non-standard width/depth necessary to accommodate anatomical measurement _48  ?????  _49 Manual Mobility Base _50 non-functional ambulator    _51 Scooter/POV  _52 can safely operate  _53 can safely transfer   _54 has adequate trunk stability  _55 cannot functionally propel manual w/c  _56 Power Mobility Base  _57 non-ambulatory  _58 cannot functionally propel manual wheelchair  _59  cannot functionally and safely operate scooter/POV _60 can safely operate and willing to  _61 Stroller Base _62 infant/child  _63 unable to propel manual wheelchair _64 allows for growth _65 non-functional ambulator _66 non-functional UE _67 Indep mobility is not a goal at this time  _68 Tilt  _69 Forward _70 Backward _71 Powered tilt  _72 Manual tilt  _73 change position against gravitational force on head and shoulders  _74 change position for pressure relief/cannot weight shift _75 transfers  _76 management of tone _77 rest periods _78 control edema _79 facilitate postural control  _80  ?????  _81 Recline  _82 Power recline on power base _83 Manual recline on manual base  _84 accommodate femur to back angle  _85 bring to full recline for ADL care  _86 change position for pressure relief/cannot weight shift _87 rest periods _88 repositioning for transfers or clothing/diaper /catheter changes _89 head positioning  _90 Lighter weight required _91 self- propulsion  _92   lifting _0  ?????  _1 Heavy Duty required _2 user weight greater than 250# _3 extreme tone/ over active movement _4 broken frame on previous chair _5  ?????  _6  Back  _7  Angle Adjustable _8  Custom molded Captain's Seat _9 postural control _10 control of tone/spasticity _11 accommodation of range of motion _12 UE functional control _13 accommodation for seating system _14  ????? _15 provide lateral trunk support _16 accommodate deformity _17 provide posterior trunk support _18 provide lumbar/sacral support _19 support trunk in midline _20 Pressure relief over spinal processes   _21  Seat Cushion Captain's seat _22 impaired sensation  _23 decubitus ulcers present _24 history of pressure ulceration _25 prevent pelvic extension _26 low maintenance  _27 stabilize pelvis  _28 accommodate obliquity _29 accommodate multiple deformity _30 neutralize lower extremity position _31 increase pressure distribution _32  ?????  _33  Pelvic/thigh support  _34  Lateral thigh guide _35  Distal medial pad  _36  Distal lateral pad _37  pelvis in neutral _38 accommodate pelvis _39  position upper legs _40  alignment _41  accommodate ROM _42  decr adduction _43 accommodate tone _44 removable for transfers _45 decr abduction  _46  Lateral trunk Supports _47  Lt     _48  Rt _49 decrease lateral trunk leaning _50 control tone _51 contour for increased contact _52 safety  _53 accommodate asymmetry _54  ?????  _55  Mounting hardware  _56 lateral trunk supports  _57 back   _58 seat _59 headrest      _60  thigh support _61 fixed   _62 swing away _63 attach seat platform/cushion to w/c frame _64 attach back cushion to w/c frame _65 mount postural supports _66 mount headrest  _67 swing medial thigh support away _68 swing lateral supports away for transfers  _69  ?????    Armrests  _70 fixed _71 adjustable height _72 removable   _73 swing away  _74 flip back   _75 reclining _76 full length pads _77 desk    _78 pads tubular  _79 provide support with elbow at 90   _80 provide support for w/c tray _81 change of height/angles for variable activities _82 remove for transfers _83 allow to come closer to table top _84 remove for access to tables _85  ?????  Hangers/ Leg rests  _86 60 _87 70 _88 90 _89 elevating _90 heavy duty  _91 articulating _92 fixed _93 lift off _94 swing away     _95 power _96 provide LE support  _97 accommodate to hamstring tightness _98 elevate legs during recline   _99 provide change in position for Legs _100 Maintain placement of feet on footplate _101 durability _102 enable transfers _103 decrease edema _104 Accommodate lower leg length _105  ?????  Foot support Footplate    <FXTKWIOXBDZHGDJM>_4<\/QASTMHDQQIWLNLGX>_211 Lt  _107  Rt  _108  Center  mount _109 flip up     _110 depth/angle adjustable _111 Amputee adapter    _112  Lt     _113  Rt _114 provide foot support _115 accommodate to ankle ROM _116 transfers _117 Provide support for residual extremity _118  allow foot to go under wheelchair base _119  decrease tone  _120  ?????  _121  Ankle strap/heel loops _122 support foot on foot support _123 decrease extraneous movement _124 provide input to heel  _125 protect foot  Tires: _126 pneumatic  _127 flat free inserts  _128 solid  _129 decrease maintenance  _130 prevent frequent flats _131 increase shock absorbency _132 decrease pain from road shock _133 decrease spasms from road shock _134  ?????  _135  Headrest  _136 provide posterior head support _137 provide posterior neck support _138 provide lateral head support _139 provide anterior head support _140 support during tilt and recline _141 improve feeding   _142 improve respiration _143 placement of switches _144 safety  _145 accommodate ROM  _146 accommodate tone _147 improve visual orientation  _148  Anterior chest strap _149  Vest _150  Shoulder retractors  _151 decrease forward movement of shoulder _152 accommodation of TLSO _153 decrease forward movement of trunk _154 decrease shoulder elevation _155 added abdominal support _156 alignment _157 assistance with shoulder control  _158  ?????  Pelvic Positioner _159 Belt _160 SubASIS bar _161 Dual Pull _162 stabilize tone _163 decrease falling out of chair/ **will not Decr potential for sliding due to pelvic tilting _164 prevent excessive rotation _165 pad for  protection over boney prominence _0 prominence comfort _1 special pull angle to control rotation _2  ?????  Upper Extremity Support _3 L   _4  R _5 Arm trough    _6 hand support _7  tray       _8 full tray _9 swivel mount _10 decrease edema      _11 decrease subluxation   _12 control tone   _13 placement for AAC/Computer/EADL _14 decrease gravitational pull on shoulders _15 provide midline positioning _16 provide support to increase UE function _17 provide hand support in natural position _18 provide work surface   POWER  WHEELCHAIR CONTROLS  _19 Proportional  _20 Non-Proportional Type Joystick  _21 Left  _22 Right _23 provides access for controlling wheelchair   _24 lacks motor control to operate proportional drive control <ENMMHWKGSUPJSRPR>_9<\/YVOPFYTWKMQKMMNO>_17 unable to understand proportional controls  Actuator Control Module  _26 Single  _27 Multiple   _28 Allow the client to operate the power seat function(s) through the joystick control   _29 Safety Reset Switches _30 Used to change modes and stop the wheelchair when driving in latch mode    _31 [employer]   _32 programming for accurate control _33 progressive Disease/changing condition _34 non-proportional drive control needed _35 Needed in order to operate power seat functions through joystick control   _36 Display box _37 Allows user to see in which mode and drive the wheelchair is set  _38 necessary for alternate controls    _39 Digital interface electronics _40 Allows w/c to operate when using alternative drive controls  <RNHAFBXUXYBFXOVA>_9<\/VBTYOMAYOKHTXHFS>_14 ASL Head Array _42 Allows client to operate wheelchair  through switches placed in tri-panel headrest  _43 Sip and puff with tubing kit _44 needed to operate sip and puff drive controls  <ELTRVUYEBXIDHWYS>_1<\/UOHFGBMSXJDBZMCE>_02 Upgraded tracking electronics _46 increase safety when driving <MVVKPQAESLPNPYYF>_1<\/TMYTRZNBVAPOLIDC>_30 correct tracking when on uneven surfaces  _48 [facility] for switches or joystick _49 Attaches switches to w/c  _50 Swing away for access or transfers _51 midline for optimal placement _52 provides for consistent access  _53 Attendant controlled joystick plus mount _54 safety _55 long distance driving <DTHYHOOILNZVJKQA>_0<\/UORVIFBPPHKFEXMD>_47 operation of seat functions _57 compliance with transportation regulations _58  ?????    Rear wheel placement/Axle adjustability _59 None _60 semi adjustable _61 fully adjustable  _62 improved UE access to wheels _63 improved stability _64 changing angle in space for improvement of postural stability _65 1-arm drive access <WLKHVFMBBUYZJQDU>_4<\/RCVKFMMCRFVOHKGO>_77 amputee pad placement _67  ?????  Wheel rims/ hand rims  _68 metal  _69 plastic coated _70 oblique projections _71 vertical projections _72 Provide ability to propel  manual wheelchair  _73  Increase self-propulsion with hand weakness/decreased grasp  Push handles _74 extended  _75 angle adjustable  _76 standard _77 caregiver access _78 caregiver assist _79 allows "hooking" to enable increased ability to perform ADLs or maintain balance  One armed device  _80 Lt   _81 Rt _82 enable propulsion of manual wheelchair with one arm   _83  ?????   Brake/wheel lock extension _84  Lt   _85  Rt _86 increase indep in applying wheel locks   _87 Side guards _88 prevent clothing getting caught in wheel or becoming soiled _89  prevent skin tears/abrasions  Battery: NF 22's x 2 _90 to power wheelchair ?????  Other: ????? ????? ?????  The above equipment has a life- long use expectancy. Growth and changes in medical and/or functional conditions would be the exceptions. This is to certify that the therapist has no financial relationship with durable medical provider or manufacturer. The therapist will not receive remuneration of any kind for the equipment recommended in this evaluation.   Patient has mobility limitation that significantly impairs safe, timely participation in one or more mobility related ADL's.  (bathing, toileting, feeding, dressing, grooming, moving from room to room)                                                             [  x] Yes _0  No Will mobility device sufficiently improve ability to participate and/or be aided in participation of MRADL's?         _1  Yes _2  No Can limitation be compensated for with use of a cane or walker?                                                                                _3  Yes _4  No Does patient or caregiver demonstrate ability/potential ability & willingness to safely use the mobility device?   _5  Yes _6  No Does patient's home environment support use of recommended mobility device?                                                    _7  Yes _8  No Does patient have sufficient upper extremity function necessary to functionally propel a manual wheelchair?     _9  Yes _10  No Does patient have sufficient strength and trunk stability to safely operate a POV (scooter)?                                  _11  Yes _12  No Does patient need additional features/benefits provided by a power wheelchair for MRADL's in the home?       _13  Yes _14  No Does the patient demonstrate the ability to safely use a power wheelchair?                                                              _15  Yes _16  No  Therapist Name Printed: [name], PT Date: 02-01-17  Therapist's Signature:   Date:   Supplier's Name Printed: [name], [name] Date: 02-01-17  Supplier's Signature:   Date:  Patient/Caregiver Signature:   Date:     This is to certify that I have read this evaluation and do agree with the content within:      Physician's Name Printed: George Guiles Tat, DO  Physician's Signature:  Date:     This is to certify that I, the above signed therapist have the following affiliations: _17  This DME provider _18  Manufacturer of recommended equipment _19  Patient's long term care facility _20  None of the above                              Plan - 02/01/17 2155    Clinical Impression Statement Pt is a 66 yr old male with parkinson's disease who presents for power wheelchair evaluation; power wheelchair with group 3 base recommended due to progressive nature of disease.     PT Frequency One time visit   Consulted and Agree with Plan of Care Patient;Family member/caregiver   Family Member Consulted wife      Patient will  benefit from skilled therapeutic intervention in order to improve the following deficits and impairments:     Visit Diagnosis: Other abnormalities of gait and mobility - Plan: PT plan of care cert/re-cert  Unspecified lack of coordination - Plan: PT plan of care cert/re-cert  Unsteadiness on feet - Plan: PT plan of care cert/re-cert      G-Codes - 69/67/89 2157    Functional Assessment Tool Used (Outpatient Only) pt amb. with  U-Step RW with unsteady gait    Functional Limitation Mobility: Walking and moving around   Mobility: Walking and Moving Around Current Status [phone]) At least 60 percent but less than 80 percent impaired, limited or restricted   Mobility: Walking and Moving Around Goal Status [phone]) At least 60 percent but less than 80 percent impaired, limited or restricted   Mobility: Walking and Moving Around Discharge Status [phone]) At least 60 percent but less than 80 percent impaired, limited or restricted       Problem List Patient Active Problem List   Diagnosis Date Noted  . Arm injury, right, initial encounter 11/15/2016  . 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 Radicular pain in right arm 10/09/2016  . Rash 09/03/2016  . GERD (gastroesophageal reflux disease) 07/31/2016  . Trochanteric bursitis 03/03/2016  . Left hand pain 10/30/2015  . Fracture, finger, multiple sites 10/30/2015  . Colon cancer screening 07/23/2015  . Healthcare maintenance 07/23/2015  . Gout 02/18/2015  . Depression 01/06/2015  . Irritation of eyelid 08/20/2014  . Dupuytren's contracture 08/20/2014  . Cough 07/21/2014  . Lumbar stenosis with neurogenic claudication 06/13/2014  . Preop cardiovascular exam 05/28/2014  . S/P deep brain stimulator placement 05/08/2014  . Joint pain 01/22/2014  . Medicare annual wellness visit, initial 01/19/2014  . Advance care planning 01/19/2014  . Parkinson's disease ([place]) 12/13/2013  . PTSD (post-traumatic stress disorder) 06/13/2013  . Erectile dysfunction 06/13/2013  . HLD (hyperlipidemia) 06/13/2013  . Hip pain 06/10/2013  . Pain in joint, shoulder region 06/10/2013  . Right leg swelling 06/03/2013  . Essential hypertension 06/03/2013  . Bradycardia by electrocardiogram 06/03/2013  . Obstructive sleep apnea 03/12/2013  . Spinal stenosis, lumbar region, with neurogenic claudication 12/14/2012    [name], PT 02/01/2017, 10:05 PM  [facility] [address] [place], [place], [address] Phone: [phone]   Fax:  [phone]  Name: [name] MRN: [id number] Date of Birth: March 27, 1951

## 2017-02-08 ENCOUNTER — Ambulatory Visit: Payer: Medicare PPO | Attending: Neurology

## 2017-02-08 VITALS — BP 145/78 | HR 45

## 2017-02-08 DIAGNOSIS — R2689 Other abnormalities of gait and mobility: Secondary | ICD-10-CM | POA: Insufficient documentation

## 2017-02-08 DIAGNOSIS — M545 Low back pain, unspecified: Secondary | ICD-10-CM

## 2017-02-08 DIAGNOSIS — M25551 Pain in right hip: Secondary | ICD-10-CM | POA: Insufficient documentation

## 2017-02-08 DIAGNOSIS — G8929 Other chronic pain: Secondary | ICD-10-CM | POA: Insufficient documentation

## 2017-02-08 DIAGNOSIS — R2681 Unsteadiness on feet: Secondary | ICD-10-CM | POA: Insufficient documentation

## 2017-02-08 DIAGNOSIS — R296 Repeated falls: Secondary | ICD-10-CM | POA: Diagnosis not present

## 2017-02-08 NOTE — Therapy (Signed)
[place] MAIN [facility] SERVICES [address] [place], [place], [address] Phone: [phone]   Fax:  [phone]  Physical Therapy Evaluation  Patient Details  Name: [name] MRN: [id number] Date of Birth: [birth year] Referring Provider: Alonza Bogus, DO  Encounter Date: 02/08/2017      PT End of Session - 02/08/17 1608    Visit Number 1   Number of Visits 17   Date for PT Re-Evaluation 04/06/17   PT Start Time 1600   PT Stop Time 5427   PT Time Calculation (min) 75 min   Equipment Utilized During Treatment Gait belt   Activity Tolerance Patient tolerated treatment well   Behavior During Therapy [facility] for tasks assessed/performed      Past Medical History:  Diagnosis Date  . Arthritis   . Bradycardia   . Cancer [facility]) 2013   skin cancer  . Depression    ptsd  . Dysrhythmia    chronic slow heart rate  . GERD (gastroesophageal reflux disease)   . Headache(784.0)    tension headaches non recent  . History of chicken pox   . History of kidney stones   . Hypertension    treated with HCTZ  . Parkinson's disease ([place])    dx'ed 15 years ago  . PTSD (post-traumatic stress disorder)   . Shortness of breath dyspnea   . Sleep apnea    doesn't use C-pap  . Varicose veins     Past Surgical History:  Procedure Laterality Date  . CHOLECYSTECTOMY N/A 10/22/2014   Procedure: LAPAROSCOPIC CHOLECYSTECTOMY WITH INTRAOPERATIVE CHOLANGIOGRAM;  Surgeon: Dia Crawford III, MD;  Location: [facility] ORS;  Service: General;  Laterality: N/A;  . cyst removed      from lip as a child  . LUMBAR LAMINECTOMY/DECOMPRESSION MICRODISCECTOMY Bilateral 12/14/2012   Procedure: Bilateral lumbar three-four, four-five decompressive laminotomy/foraminotomy;  Surgeon: Charlie Pitter, MD;  Location: [place] NEURO ORS;  Service: Neurosurgery;  Laterality: Bilateral;  . PULSE GENERATOR IMPLANT Bilateral 12/13/2013   Procedure: Bilateral implantable pulse generator placement;   Surgeon: Erline Levine, MD;  Location: [place] NEURO ORS;  Service: Neurosurgery;  Laterality: Bilateral;  Bilateral implantable pulse generator placement  . skin cancer removed     from ears,   12 lft arm  rt leg 15  . SUBTHALAMIC STIMULATOR INSERTION Bilateral 12/06/2013   Procedure: SUBTHALAMIC STIMULATOR INSERTION;  Surgeon: Erline Levine, MD;  Location: [place] NEURO ORS;  Service: Neurosurgery;  Laterality: Bilateral;  Bilateral deep brain stimulator placement    Vitals:   02/08/17 1603  BP: (!) 145/78  Pulse: (!) 45  SpO2: 97%         Subjective Assessment - 02/08/17 1627    Subjective Parkinson's disease   Pertinent History Patient referred for physical therapy due to wife's request for patient to get aquatic therapy to help with his conditioning. Pt is also complaining of chronic low back and R hip pain and frequent falls. 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 The last time he was in the water he was very uncomfortable because he didn't feel like he had any control. Pt has a history of 2 low back surgeries in August 2014 and February 2016. Pt and wife are unable to recall exactly what was performed but they know that he had stenosis. Surgeries were performed by Dr. Annette Stable in [place]. Pt reports that his low back and R hip pain are  constant. He describes the pain in the back as "dull" and the pain in the hip as "sharp." For both back and hip worst pain is 10/10, Best: 2/10, Present: 2/10. No particular episode of trauma to his low back or hip preceding the pain. Pt has a history of Parkinson's with a deep brain stimulator. He ambulates with a 4 wheeled walker called a U-step (automatic brakes, audio cues for stepping). He states that he falls frequently and reports that he has had approximately 40-50 falls in the last 6 months. No specific recent changes in his health.    Limitations Walking;Standing;Sitting   How long can you sit  comfortably? 30 minutes   How long can you stand comfortably? 5 minutes   How long can you walk comfortably? 10 minutes   Patient Stated Goals Improve comfort in the water, improve balance, decrease back and R hip pain   Currently in Pain? Yes  Worst: 10/10, Best: 2/10   Pain Score 2    Pain Location Back   Pain Orientation Lower  Central   Pain Descriptors / Indicators Dull   Pain Type Chronic pain   Pain Radiating Towards None, pt doesn't report that low back pain radiates into the hip   Pain Onset More than a month ago   Pain Frequency Constant   Aggravating Factors  walking, sitting, worse first thing in the morning and then worsens as day progresses as well,    Pain Relieving Factors Lumbar brace, naproxen, difficulty taking opiods due to poor balance, position of comfort is supine or L sidelying   Multiple Pain Sites Yes   Pain Score 2   Pain Location Hip   Pain Orientation Right   Pain Descriptors / Indicators Sharp   Pain Type Chronic pain   Pain Onset More than a month ago   Pain Frequency Constant   Aggravating Factors  see above as aggravating/easing factors are similar            [facility] PT Assessment - 02/08/17 1631      Assessment   Medical Diagnosis Parkinson's Disease   Referring Provider Wells Guiles Tat, DO   Onset Date/Surgical Date --  PD approximatley 2002, back pain around 2012   Hand Dominance Right   Next MD Visit December 2018   Prior Therapy Yes, multiple bouts     Precautions   Precautions Fall   Precaution Comments Pt currently wearing lumbar corset brace but this was not specifically ordered by MD     Restrictions   Weight Bearing Restrictions No     Balance Screen   Has the patient fallen in the past 6 months Yes   How many times? 40-50   Has the patient had a decrease in activity level because of a fear of falling?  Yes   Is the patient reluctant to leave their home because of a fear of falling?  Yes     [place] residence   Living Arrangements Spouse/significant other   Available Help at Discharge Family   Type of [address] One level   [place] - 4 wheels;Cane - single point;Grab bars - toilet;Grab bars - tub/shower;Wheelchair - Education administrator (comment)  waiting for a power wheelchair, lift recliners     Prior Function   Level of Independence Needs assistance with ADLs;Needs assistance with homemaking   Vocation Retired   Leisure Previously Acupuncturist  Cognition   Overall Cognitive Status Within Functional Limits for tasks assessed     Observation/Other Assessments   Other Surveys  Other Surveys   Activities of Balance Confidence Scale (ABC Scale)  51.88%   Modified [place] 54%     Sensation   Additional Comments Denies N/T in bilateral LEs     ROM / Strength   AROM / PROM / Strength Strength;AROM     AROM   Overall AROM Comments Decreased lumbar AROM with flexion, extension, rotation, and lateral flexion. Pt denies reproduction of low back pain with all AROM and overpressure. However positive reproduction of bilateral low back pain from lumbar axial compression; Hamstring length: R: 80 degrees; R hip scour, FADIR, FABER positive for reproduction of hip pain, negative on L hip. Negative slump and SLR bilaterally. Pt reports history of R hip arthritis. Pt unable to lay on his stomach for CPA of lumbar spine. Pt has history of lumbar surgeries and is unable to report if he has a fusion in his back. Decreased hip internal rotation noted bilaterally     Strength   Overall Strength Comments Hip abduction/adduction tested strong in sitting position. Not tested in sidelying due to back brace and pain with movement. Hip extension strength not tested   Strength Assessment Site Hip;Knee;Ankle   Right/Left Hip Right;Left   Right Hip Flexion 5/5   Right Hip Extension 5/5   Right Hip External Rotation  5/5    Right Hip Internal Rotation 5/5   Left Hip Flexion 5/5   Left Hip Extension 5/5   Left Hip External Rotation 5/5   Left Hip Internal Rotation 5/5   Right/Left Knee Right;Left   Right Knee Flexion 5/5   Right Knee Extension 5/5   Left Knee Flexion 5/5   Left Knee Extension 5/5   Right/Left Ankle Right;Left   Right Ankle Dorsiflexion 5/5   Left Ankle Dorsiflexion 5/5     Palpation   Palpation comment Painful to palpation just posterior to R greater trochanter. No pain with palpation along lumbar paraspinals. No pain with palpation over deep R hip external rotators     Transfers   Comments Pt with poor stability noted during transfers     Ambulation/Gait   Gait Comments Pt ambulates with decreased step length and speed using U-step four wheeled walker. Occasionally catches foot on edge of walker during turns.     Standardized Balance Assessment   Standardized Balance Assessment Berg Balance Test;Timed Up and Go Test;Five Times Sit to Stand;10 meter walk test   Five times sit to stand comments  20.1s   10 Meter Walk 11.5s = 0.87 m/s     Berg Balance Test   Sit to Stand Needs minimal aid to stand or to stabilize   Standing Unsupported Able to stand safely 2 minutes   Sitting with Back Unsupported but Feet Supported on Floor or Stool Able to sit safely and securely 2 minutes   Stand to Sit Uses backs of legs against chair to control descent   Transfers Able to transfer with verbal cueing and /or supervision   Standing Unsupported with Eyes Closed Able to stand 10 seconds with supervision   Standing Ubsupported with Feet Together Able to place feet together independently and stand 1 minute safely   From Standing, Reach Forward with Outstretched Arm Can reach forward >12 cm safely (5")   From Standing Position, Pick up Object from Floor Able to pick up shoe, needs supervision   From Standing  Position, Turn to Look Behind Over each Shoulder Looks behind one side only/other side shows  less weight shift   Turn 360 Degrees Needs close supervision or verbal cueing   Standing Unsupported, Alternately Place Feet on Step/Stool Needs assistance to keep from falling or unable to try   Standing Unsupported, One Foot in [employer] balance while stepping or standing   Standing on One Leg Unable to try or needs assist to prevent fall   Total Score 30     Timed Up and Go Test   TUG Normal TUG   Normal TUG (seconds) 22.1            Objective measurements completed on examination: See above findings.                 Plan - 02/09/17 1645    Clinical Impression Statement Patient is a pleasant 66 yo male referred for physical therapy due to wife's request for patient to get aquatic therapy to help with his conditioning. Pt is also complaining of chronic low back and R hip pain and frequent falls. 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 Pt has a history of 2 low back surgeries in August 2014 and February 2016. He also has a history of Parkinson's with a deep brain stimulator. He ambulates with a U-step walker (5 wheels, automatic brakes, audio cues for stepping). He states that he falls frequently and reports that he has had approximately 40-50 falls in the last 6 months. His PT evaluation reveals painful palpation just posterior to R greater trochanter. No pain with palpation over deep R hip external rotators. R hip scour, FADIR, FABER positive for reproduction of hip pain. Pt reports history of significant arthritis in his hip. No pain with palpation along lumbar paraspinals. Decreased lumbar AROM with flexion, extension, rotation, and lateral flexion. Pt denies reproduction of low back pain with all AROM and overpressure. However positive reproduction of bilateral low back pain from lumbar axial compression. Negative neural tension tests for lumbar spine. Severely impaired balance with BERG score of 30/56. 5TSTS is 20.1  and TUG is 22.1 which are both above cut-off for increased fall risk. 8m gait speed is also impaired at 0.87 m/s. Pt will benefit from skilled PT services to address deficits in strength, balance, mobility, and pain in order to improve function at home, decrease fall risk, and prevent decline.   History and Personal Factors relevant to plan of care: 3 or more personal factors/comorbidities, 4 or more body systems/activity limitations/participation restrictions    Clinical Presentation Unstable   Clinical Presentation due to: progressive neurodegenerative disorder, recurrent falls, highly variable low back and hip pain   Clinical Decision Making High   Rehab Potential Fair   PT Frequency 2x / week   PT Duration 8 weeks   PT Treatment/Interventions ADLs/Self Care Home Management;Aquatic Therapy;Cryotherapy;Electrical Stimulation;Iontophoresis 4mg /ml Dexamethasone;Moist Heat;Ultrasound;Contrast Bath;DME Instruction;Gait training;Stair training;Functional mobility training;Therapeutic activities;Therapeutic exercise;Balance training;Neuromuscular re-education;Patient/family education;Manual techniques;Wheelchair mobility training;Energy conservation   PT Next Visit Plan progress balance, strength, aquatic therapy   PT Home Exercise Plan None provided at this time until further assessed, wife reports pt does not perform current HEP from [facility] PT   Consulted and Agree with Plan of Care Patient;Family member/caregiver   Family Member Consulted wife      Patient will benefit from skilled therapeutic intervention in order to improve the following deficits and impairments:  Abnormal gait, Decreased balance,  Decreased coordination, Decreased mobility, Impaired tone, Postural dysfunction  Visit Diagnosis: Other abnormalities of gait and mobility - Plan: PT plan of care cert/re-cert  Unsteadiness on feet - Plan: PT plan of care cert/re-cert  Repeated falls - Plan: PT plan of care cert/re-cert  Chronic  midline low back pain without sciatica - Plan: PT plan of care cert/re-cert  Pain in right hip - Plan: PT plan of care cert/re-cert      G-Codes - 85/02/77 1655    Functional Assessment Tool Used (Outpatient Only) 27m gait speed, TUG, BERG, 5TSTS, clinical judgement, NPRS   Functional Limitation Mobility: Walking and moving around   Mobility: Walking and Moving Around Current Status (A1287) At least 60 percent but less than 80 percent impaired, limited or restricted   Mobility: Walking and Moving Around Goal Status [phone]) At least 40 percent but less than 60 percent impaired, limited or restricted       Problem List Patient Active Problem List   Diagnosis Date Noted  . Arm injury, right, initial encounter 11/15/2016  . 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 Radicular pain in right arm 10/09/2016  . Rash 09/03/2016  . GERD (gastroesophageal reflux disease) 07/31/2016  . Trochanteric bursitis 03/03/2016  . Left hand pain 10/30/2015  . Fracture, finger, multiple sites 10/30/2015  . Colon cancer screening 07/23/2015  . Healthcare maintenance 07/23/2015  . Gout 02/18/2015  . Depression 01/06/2015  . Irritation of eyelid 08/20/2014  . Dupuytren's contracture 08/20/2014  . Cough 07/21/2014  . Lumbar stenosis with neurogenic claudication 06/13/2014  . Preop cardiovascular exam 05/28/2014  . S/P deep brain stimulator placement 05/08/2014  . Joint pain 01/22/2014  . Medicare annual wellness visit, initial 01/19/2014  . Advance care planning 01/19/2014  . Parkinson's disease ([place]) 12/13/2013  . PTSD (post-traumatic stress disorder) 06/13/2013  . Erectile dysfunction 06/13/2013  . HLD (hyperlipidemia) 06/13/2013  . Hip pain 06/10/2013  . Pain in joint, shoulder region 06/10/2013  . Right leg swelling 06/03/2013  . Essential hypertension 06/03/2013  . Bradycardia by electrocardiogram 06/03/2013  . Obstructive sleep apnea 03/12/2013  . Spinal stenosis, lumbar region, with  neurogenic claudication 12/14/2012    Phillips Grout PT, DPT   Precious Segall 02/09/2017, 4:59 PM  [place] MAIN [facility] SERVICES [name] [place], [place], [address] Phone: [phone]   Fax:  [phone]  Name: ATWELL MCDANEL MRN: [id number] Date of Birth: May 13, 1950

## 2017-02-09 ENCOUNTER — Ambulatory Visit (INDEPENDENT_AMBULATORY_CARE_PROVIDER_SITE_OTHER): Payer: Medicare PPO | Admitting: Psychology

## 2017-02-09 DIAGNOSIS — F4323 Adjustment disorder with mixed anxiety and depressed mood: Secondary | ICD-10-CM

## 2017-02-09 DIAGNOSIS — F331 Major depressive disorder, recurrent, moderate: Secondary | ICD-10-CM

## 2017-02-09 NOTE — Patient Instructions (Signed)
No HEP issued today

## 2017-02-16 ENCOUNTER — Ambulatory Visit: Payer: Medicare PPO | Attending: Neurology

## 2017-02-16 DIAGNOSIS — R278 Other lack of coordination: Secondary | ICD-10-CM | POA: Diagnosis present

## 2017-02-16 DIAGNOSIS — M25551 Pain in right hip: Secondary | ICD-10-CM

## 2017-02-16 DIAGNOSIS — G8929 Other chronic pain: Secondary | ICD-10-CM | POA: Insufficient documentation

## 2017-02-16 DIAGNOSIS — R1310 Dysphagia, unspecified: Secondary | ICD-10-CM

## 2017-02-16 DIAGNOSIS — R296 Repeated falls: Secondary | ICD-10-CM | POA: Insufficient documentation

## 2017-02-16 DIAGNOSIS — R41844 Frontal lobe and executive function deficit: Secondary | ICD-10-CM

## 2017-02-16 DIAGNOSIS — R293 Abnormal posture: Secondary | ICD-10-CM | POA: Diagnosis present

## 2017-02-16 DIAGNOSIS — R262 Difficulty in walking, not elsewhere classified: Secondary | ICD-10-CM | POA: Insufficient documentation

## 2017-02-16 DIAGNOSIS — M545 Low back pain, unspecified: Secondary | ICD-10-CM

## 2017-02-16 DIAGNOSIS — R29898 Other symptoms and signs involving the musculoskeletal system: Secondary | ICD-10-CM | POA: Diagnosis present

## 2017-02-16 DIAGNOSIS — R1312 Dysphagia, oropharyngeal phase: Secondary | ICD-10-CM | POA: Diagnosis not present

## 2017-02-16 DIAGNOSIS — R2681 Unsteadiness on feet: Secondary | ICD-10-CM | POA: Diagnosis not present

## 2017-02-16 DIAGNOSIS — R29818 Other symptoms and signs involving the nervous system: Secondary | ICD-10-CM

## 2017-02-16 DIAGNOSIS — M6281 Muscle weakness (generalized): Secondary | ICD-10-CM

## 2017-02-16 DIAGNOSIS — R471 Dysarthria and anarthria: Secondary | ICD-10-CM | POA: Insufficient documentation

## 2017-02-16 DIAGNOSIS — R279 Unspecified lack of coordination: Secondary | ICD-10-CM | POA: Diagnosis not present

## 2017-02-16 DIAGNOSIS — R2689 Other abnormalities of gait and mobility: Secondary | ICD-10-CM | POA: Diagnosis not present

## 2017-02-16 DIAGNOSIS — R41841 Cognitive communication deficit: Secondary | ICD-10-CM | POA: Diagnosis present

## 2017-02-16 NOTE — Therapy (Signed)
[place] MAIN [facility] SERVICES [address] [place], [place], [address] Phone: [phone]   Fax:  [phone]  Physical Therapy Treatment  Patient Details  Name: [name] MRN: [id number] Date of Birth: 12/23/1950 Referring Provider: Alonza Bogus, DO  Encounter Date: 02/16/2017      PT End of Session - 02/16/17 1321    Visit Number 2   Number of Visits 17   Date for PT Re-Evaluation 04/06/17   PT Start Time 0800   PT Stop Time 0850   PT Time Calculation (min) 50 min   Activity Tolerance Patient tolerated treatment well   Behavior During Therapy [facility] for tasks assessed/performed      Past Medical History:  Diagnosis Date  . Arthritis   . Bradycardia   . Cancer [facility]) 2013   skin cancer  . Depression    ptsd  . Dysrhythmia    chronic slow heart rate  . GERD (gastroesophageal reflux disease)   . Headache(784.0)    tension headaches non recent  . History of chicken pox   . History of kidney stones   . Hypertension    treated with HCTZ  . Parkinson's disease ([place])    dx'ed 15 years ago  . PTSD (post-traumatic stress disorder)   . Shortness of breath dyspnea   . Sleep apnea    doesn't use C-pap  . Varicose veins     Past Surgical History:  Procedure Laterality Date  . CHOLECYSTECTOMY N/A 10/22/2014   Procedure: LAPAROSCOPIC CHOLECYSTECTOMY WITH INTRAOPERATIVE CHOLANGIOGRAM;  Surgeon: Dia Crawford III, MD;  Location: [facility] ORS;  Service: General;  Laterality: N/A;  . cyst removed      from lip as a child  . LUMBAR LAMINECTOMY/DECOMPRESSION MICRODISCECTOMY Bilateral 12/14/2012   Procedure: Bilateral lumbar three-four, four-five decompressive laminotomy/foraminotomy;  Surgeon: Charlie Pitter, MD;  Location: [place] NEURO ORS;  Service: Neurosurgery;  Laterality: Bilateral;  . PULSE GENERATOR IMPLANT Bilateral 12/13/2013   Procedure: Bilateral implantable pulse generator placement;  Surgeon: Erline Levine, MD;  Location: [place] NEURO ORS;   Service: Neurosurgery;  Laterality: Bilateral;  Bilateral implantable pulse generator placement  . skin cancer removed     from ears,   12 lft arm  rt leg 15  . SUBTHALAMIC STIMULATOR INSERTION Bilateral 12/06/2013   Procedure: SUBTHALAMIC STIMULATOR INSERTION;  Surgeon: Erline Levine, MD;  Location: [place] NEURO ORS;  Service: Neurosurgery;  Laterality: Bilateral;  Bilateral deep brain stimulator placement    There were no vitals filed for this visit.      Subjective Assessment - 02/16/17 1315    Subjective Reports no falls in past two weeks, but notes he has had "3-400 hundred falls" in the past few years. Pt/spouse state pt would like to feel comfortable in the water, as they have a community poot they could use and pt would like to "get better balance"   Pertinent History Patient referred for physical therapy due to wife's request for patient to get aquatic therapy to help with his conditioning. Pt is also complaining of chronic low back and R hip pain and frequent falls. 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 The last time he was in the water he was very uncomfortable because he didn't feel like he had any control. Pt has a history of 2 low back surgeries in August 2014 and February 2016. Pt and wife are unable to recall exactly what was  performed but they know that he had stenosis. Surgeries were performed by Dr. Annette Stable in [place]. Pt reports that his low back and R hip pain are constant. He describes the pain in the back as "dull" and the pain in the hip as "sharp." For both back and hip worst pain is 10/10, Best: 2/10, Present: 2/10. No particular episode of trauma to his low back or hip preceding the pain. Pt has a history of Parkinson's with a deep brain stimulator. He ambulates with a 4 wheeled walker called a U-step (automatic brakes, audio cues for stepping). He states that he falls frequently and reports that he has had approximately  40-50 falls in the last 6 months. No specific recent changes in his health.    Patient Stated Goals Improve comfort in the water, improve balance, decrease back and R hip pain   Pain Score 4    Pain Location Back   Pain Type Chronic pain     Ambulation, focus on balance/posture, Min/Mod A c kick boards - 4 L fwd - 4 L side  Core stabilization with LE exercise, UE support, occasional assist - Hip flex/ext - Hip Abd/add - Minisquats  Balance - Ball throw/catch, Min/Mod A (with spouse outside of pool) - unsupported walking to chase ball, close guard  Bench activities - Seated bicycle, 5 min                              PT Education - 02/16/17 1319    Education provided Yes   Education Details Properties of water and how to use to strengthen and support and work on balance. Core strengthening concepts; hip strengthening both open and closed chain activities   Person(s) Educated Patient;Spouse   Methods Explanation;Demonstration;Tactile cues;Verbal cues   Comprehension Verbalized understanding;Verbal cues required;Tactile cues required;Need further instruction          PT Short Term Goals - 02/16/17 1324      PT SHORT TERM GOAL #1   Title Pt will perform HEP with family's supervision, for improved balance, transfers, and gait.     Baseline 02/08/17: does not perform HEP   Time 4   Period Weeks   Status New           PT Long Term Goals - 02/16/17 1324      PT LONG TERM GOAL #1   Title Pt will decrease 5TSTS by at least 3 seconds in order to demonstrate clinically significant improvement in LE strength    Baseline 02/08/17: 20.1 seconds   Time 8   Period Weeks   Status New     PT LONG TERM GOAL #2   Title Pt will decrease TUG by at least 3.4s in order to demonstrate decreased fall risk    Baseline 02/08/17: 22.1s   Time 8   Period Weeks   Status New     PT LONG TERM GOAL #3   Title Pt will improve BERG by at least 3 points in order  to demonstrate clinically significant improvement in balance.   Baseline 02/08/17: 30/56   Time 8   Period Weeks   Status New     PT LONG TERM GOAL #4   Title Pt/wife will verbalize understanding of plans for continued community fitness upon D/C from PT including aquatic therapy program   Baseline 02/08/17: pt currently reports being uncomfortable in the water   Time 8   Period Weeks   Status  New     PT LONG TERM GOAL #5   Title Pt will decrease mODI scoreby at least 13 points in order demonstrate clinically significant reduction in pain/disability    Baseline 02/08/17: 54%   Time 8   Period Weeks   Status New               Plan - 02/16/17 1322    Clinical Impression Statement Pt challenged greatly with balance; requires consistent cueing to follow instructions for slowing movements and gaining control of balance before continuing activity at hand. Pt requires Min to Mod A throughout ambulation and balance activities; Min A for more static activities at rail.    Rehab Potential Fair   PT Frequency 2x / week   PT Duration 8 weeks   PT Treatment/Interventions ADLs/Self Care Home Management;Aquatic Therapy;Cryotherapy;Electrical Stimulation;Iontophoresis 4mg /ml Dexamethasone;Moist Heat;Ultrasound;Contrast Bath;DME Instruction;Gait training;Stair training;Functional mobility training;Therapeutic activities;Therapeutic exercise;Balance training;Neuromuscular re-education;Patient/family education;Manual techniques;Wheelchair mobility training;Energy conservation   PT Next Visit Plan progress balance, strength, aquatic therapy   PT Home Exercise Plan None provided at this time until further assessed, wife reports pt does not perform current HEP from [facility] PT   Consulted and Agree with Plan of Care Patient;Family member/caregiver   Family Member Consulted wife      Patient will benefit from skilled therapeutic intervention in order to improve the following deficits and impairments:   Abnormal gait, Decreased balance, Decreased coordination, Decreased mobility, Impaired tone, Postural dysfunction  Visit Diagnosis: Other abnormalities of gait and mobility  Unsteadiness on feet  Repeated falls  Chronic midline low back pain without sciatica  Pain in right hip  Unspecified lack of coordination  Dysarthria and anarthria  Dysphagia, oropharyngeal  Cognitive communication deficit  Other symptoms and signs involving the nervous system  Frontal lobe and executive function deficit  Other lack of coordination  Abnormal posture  Other symptoms and signs involving the musculoskeletal system  Muscle weakness (generalized)  Dysphagia, unspecified type  Difficulty walking     Problem List Patient Active Problem List   Diagnosis Date Noted  . Arm injury, right, initial encounter 11/15/2016  . 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 Radicular pain in right arm 10/09/2016  . Rash 09/03/2016  . GERD (gastroesophageal reflux disease) 07/31/2016  . Trochanteric bursitis 03/03/2016  . Left hand pain 10/30/2015  . Fracture, finger, multiple sites 10/30/2015  . Colon cancer screening 07/23/2015  . Healthcare maintenance 07/23/2015  . Gout 02/18/2015  . Depression 01/06/2015  . Irritation of eyelid 08/20/2014  . Dupuytren's contracture 08/20/2014  . Cough 07/21/2014  . Lumbar stenosis with neurogenic claudication 06/13/2014  . Preop cardiovascular exam 05/28/2014  . S/P deep brain stimulator placement 05/08/2014  . Joint pain 01/22/2014  . Medicare annual wellness visit, initial 01/19/2014  . Advance care planning 01/19/2014  . Parkinson's disease ([place]) 12/13/2013  . PTSD (post-traumatic stress disorder) 06/13/2013  . Erectile dysfunction 06/13/2013  . HLD (hyperlipidemia) 06/13/2013  . Hip pain 06/10/2013  . Pain in joint, shoulder region 06/10/2013  . Right leg swelling 06/03/2013  . Essential hypertension 06/03/2013  . Bradycardia by  electrocardiogram 06/03/2013  . Obstructive sleep apnea 03/12/2013  . Spinal stenosis, lumbar region, with neurogenic claudication 12/14/2012    Larae Grooms 02/16/2017, 1:26 PM  [place] MAIN [facility] SERVICES [place], [place], [address] Phone: [phone]   Fax:  [phone]  Name: [name] MRN: [id number] Date of Birth: 1950-11-17

## 2017-02-21 ENCOUNTER — Ambulatory Visit: Payer: Medicare PPO

## 2017-02-21 DIAGNOSIS — M545 Low back pain, unspecified: Secondary | ICD-10-CM

## 2017-02-21 DIAGNOSIS — G8929 Other chronic pain: Secondary | ICD-10-CM | POA: Diagnosis not present

## 2017-02-21 DIAGNOSIS — R296 Repeated falls: Secondary | ICD-10-CM | POA: Diagnosis not present

## 2017-02-21 DIAGNOSIS — R2681 Unsteadiness on feet: Secondary | ICD-10-CM

## 2017-02-21 DIAGNOSIS — M25551 Pain in right hip: Secondary | ICD-10-CM | POA: Diagnosis not present

## 2017-02-21 DIAGNOSIS — R279 Unspecified lack of coordination: Secondary | ICD-10-CM | POA: Diagnosis not present

## 2017-02-21 DIAGNOSIS — R2689 Other abnormalities of gait and mobility: Secondary | ICD-10-CM

## 2017-02-21 DIAGNOSIS — R1312 Dysphagia, oropharyngeal phase: Secondary | ICD-10-CM | POA: Diagnosis not present

## 2017-02-21 DIAGNOSIS — R471 Dysarthria and anarthria: Secondary | ICD-10-CM | POA: Diagnosis not present

## 2017-02-21 NOTE — Therapy (Signed)
[place] MAIN [facility] SERVICES [address] [place], [place], [address] Phone: [phone]   Fax:  [phone]  Physical Therapy Treatment  Patient Details  Name: [name] MRN: [id number] Date of Birth: Dec 29, 1950 Referring Provider: Alonza Bogus, DO   Encounter Date: 02/21/2017  PT End of Session - 02/21/17 1701    Visit Number  3    Number of Visits  17    Date for PT Re-Evaluation  04/06/17    PT Start Time  5366    PT Stop Time  1600    PT Time Calculation (min)  45 min    Equipment Utilized During Treatment  Gait belt    Activity Tolerance  Patient tolerated treatment well    Behavior During Therapy  [facility] for tasks assessed/performed       Past Medical History:  Diagnosis Date  . Arthritis   . Bradycardia   . Cancer [facility]) 2013   skin cancer  . Depression    ptsd  . Dysrhythmia    chronic slow heart rate  . GERD (gastroesophageal reflux disease)   . Headache(784.0)    tension headaches non recent  . History of chicken pox   . History of kidney stones   . Hypertension    treated with HCTZ  . Parkinson's disease ([place])    dx'ed 15 years ago  . PTSD (post-traumatic stress disorder)   . Shortness of breath dyspnea   . Sleep apnea    doesn't use C-pap  . Varicose veins     Past Surgical History:  Procedure Laterality Date  . cyst removed      from lip as a child  . skin cancer removed     from ears,   12 lft arm  rt leg 15    There were no vitals filed for this visit.  Subjective Assessment - 02/21/17 1519    Subjective  Patient reports 6 falls since last week. Has had two aquatic setting sessions. Reports two falls this morning when in garage. Wife reports pt. struggles lifting feet, tripping over foot     Pertinent History  Patient referred for physical therapy due to wife's request for patient to get aquatic therapy to help with his conditioning. Pt is also complaining of chronic low back and R hip pain and  frequent falls. 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 The last time he was in the water he was very uncomfortable because he didn't feel like he had any control. Pt has a history of 2 low back surgeries in August 2014 and February 2016. Pt and wife are unable to recall exactly what was performed but they know that he had stenosis. Surgeries were performed by Dr. Annette Stable in [place]. Pt reports that his low back and R hip pain are constant. He describes the pain in the back as "dull" and the pain in the hip as "sharp." For both back and hip worst pain is 10/10, Best: 2/10, Present: 2/10. No particular episode of trauma to his low back or hip preceding the pain. Pt has a history of Parkinson's with a deep brain stimulator. He ambulates with a 4 wheeled walker called a U-step (automatic brakes, audio cues for stepping). He states that he falls frequently and reports that he has had approximately 40-50 falls in the last 6 months. No specific recent changes in his health.  Patient Stated Goals  Improve comfort in the water, improve balance, decrease back and R hip pain    Currently in Pain?  Yes    Pain Score  6     Pain Location  Back    Pain Orientation  Upper    Pain Descriptors / Indicators  Aching    Pain Type  Chronic pain    Pain Radiating Towards  pain is between shoulder blades    Pain Onset  More than a month ago    Pain Frequency  Constant      TherEx Sit to stands from plinth to catch ball and return it, requires use of back of legs for full stand.   Stand pivot sit transfer from plinth to seat with arm rests 10x.   TrA activation seated position 10x5 second holds  LAQ 10x   Neuro Re-ed Static stand with dynamic ball toss 3 minutes , occasional posterior lean no LOB  Step over and back over half foam roller, BUE support, cues for decreased UE support 15x each arm  Step on squishy small bosu with PT directing directing  leg and color, decreased from BUE support to SUE support   Agility ladder: one foot in each box to increase step length with decreasing UE support (BUE to SUE)   Ambulate with walker 2x 50 ft, cues for upright posture   Static standing posture with abdominal tightening for improved posture in // bars with CGA   Pt. response to medical necessity:  Patient will benefit from skilled physical therapy to address deficits in strength, balance, mobility, and pain in order to improve function at home, decrease fall risk, and prevent decline.                      PT Education - 02/21/17 1700    Education provided  Yes    Education Details  HEP    Person(s) Educated  Patient;Spouse    Methods  Explanation;Demonstration;Verbal cues;Handout    Comprehension  Verbalized understanding;Returned demonstration       PT Short Term Goals - 02/16/17 1324      PT SHORT TERM GOAL #1   Title  Pt will perform HEP with family's supervision, for improved balance, transfers, and gait.      Baseline  02/08/17: does not perform HEP    Time  4    Period  Weeks    Status  New        PT Long Term Goals - 02/16/17 1324      PT LONG TERM GOAL #1   Title  Pt will decrease 5TSTS by at least 3 seconds in order to demonstrate clinically significant improvement in LE strength     Baseline  02/08/17: 20.1 seconds    Time  8    Period  Weeks    Status  New      PT LONG TERM GOAL #2   Title  Pt will decrease TUG by at least 3.4s in order to demonstrate decreased fall risk     Baseline  02/08/17: 22.1s    Time  8    Period  Weeks    Status  New      PT LONG TERM GOAL #3   Title  Pt will improve BERG by at least 3 points in order to demonstrate clinically significant improvement in balance.    Baseline  02/08/17: 30/56    Time  8    Period  Weeks    Status  New      PT LONG TERM GOAL #4   Title  Pt/wife will verbalize understanding of plans for continued community fitness upon D/C from  PT including aquatic therapy program    Baseline  02/08/17: pt currently reports being uncomfortable in the water    Time  8    Period  Weeks    Status  New      PT LONG TERM GOAL #5   Title  Pt will decrease mODI scoreby at least 13 points in order demonstrate clinically significant reduction in pain/disability     Baseline  02/08/17: 54%    Time  8    Period  Weeks    Status  New            Plan - 02/21/17 1707    Clinical Impression Statement  Patient has frequent posterior trunk sway with no episodes of LOB. Patient educated on core stabilization when standing to allow for improved posture and decreased posterior tilt. Stepping over surfaces challenges patient. Patient will benefit from skilled physical therapy to address deficits in strength, balance, mobility, and pain in order to improve function at home, decrease fall risk, and prevent decline.      Rehab Potential  Fair    PT Frequency  2x / week    PT Duration  8 weeks    PT Treatment/Interventions  ADLs/Self Care Home Management;Aquatic Therapy;Cryotherapy;Electrical Stimulation;Iontophoresis 4mg /ml Dexamethasone;Moist Heat;Ultrasound;Contrast Bath;DME Instruction;Gait training;Stair training;Functional mobility training;Therapeutic activities;Therapeutic exercise;Balance training;Neuromuscular re-education;Patient/family education;Manual techniques;Wheelchair mobility training;Energy conservation    PT Next Visit Plan  progress balance, strength, aquatic therapy    PT Home Exercise Plan  None provided at this time until further assessed, wife reports pt does not perform current HEP from [facility] PT    Consulted and Agree with Plan of Care  Patient;Family member/caregiver    Family Member Consulted  wife       Patient will benefit from skilled therapeutic intervention in order to improve the following deficits and impairments:  Abnormal gait, Decreased balance, Decreased coordination, Decreased mobility, Impaired tone, Postural  dysfunction  Visit Diagnosis: Other abnormalities of gait and mobility  Unsteadiness on feet  Repeated falls  Chronic midline low back pain without sciatica     Problem List Patient Active Problem List   Diagnosis Date Noted  . Arm injury, right, initial encounter 11/15/2016  . 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 Radicular pain in right arm 10/09/2016  . Rash 09/03/2016  . GERD (gastroesophageal reflux disease) 07/31/2016  . Trochanteric bursitis 03/03/2016  . Left hand pain 10/30/2015  . Fracture, finger, multiple sites 10/30/2015  . Colon cancer screening 07/23/2015  . Healthcare maintenance 07/23/2015  . Gout 02/18/2015  . Depression 01/06/2015  . Irritation of eyelid 08/20/2014  . Dupuytren's contracture 08/20/2014  . Cough 07/21/2014  . Lumbar stenosis with neurogenic claudication 06/13/2014  . Preop cardiovascular exam 05/28/2014  . S/P deep brain stimulator placement 05/08/2014  . Joint pain 01/22/2014  . Medicare annual wellness visit, initial 01/19/2014  . Advance care planning 01/19/2014  . Parkinson's disease ([place]) 12/13/2013  . PTSD (post-traumatic stress disorder) 06/13/2013  . Erectile dysfunction 06/13/2013  . HLD (hyperlipidemia) 06/13/2013  . Hip pain 06/10/2013  . Pain in joint, shoulder region 06/10/2013  . Right leg swelling 06/03/2013  . Essential hypertension 06/03/2013  . Bradycardia by electrocardiogram 06/03/2013  . Obstructive sleep apnea 03/12/2013  . Spinal stenosis, lumbar region, with neurogenic claudication 12/14/2012  Janna Arch, PT, DPT   Janna Arch 02/21/2017, 5:09 PM  [place] MAIN [facility] SERVICES [address] [place], [place], [address] Phone: [phone]   Fax:  [phone]  Name: [name] MRN: [id number] Date of Birth: May 15, 1950

## 2017-02-22 ENCOUNTER — Ambulatory Visit (HOSPITAL_BASED_OUTPATIENT_CLINIC_OR_DEPARTMENT_OTHER): Payer: Medicare PPO | Attending: Internal Medicine | Admitting: Internal Medicine

## 2017-02-22 DIAGNOSIS — G4733 Obstructive sleep apnea (adult) (pediatric): Secondary | ICD-10-CM | POA: Diagnosis not present

## 2017-02-22 DIAGNOSIS — G4752 REM sleep behavior disorder: Secondary | ICD-10-CM

## 2017-02-23 ENCOUNTER — Ambulatory Visit (INDEPENDENT_AMBULATORY_CARE_PROVIDER_SITE_OTHER): Payer: Medicare PPO | Admitting: Psychology

## 2017-02-23 ENCOUNTER — Ambulatory Visit (INDEPENDENT_AMBULATORY_CARE_PROVIDER_SITE_OTHER): Payer: Medicare PPO

## 2017-02-23 ENCOUNTER — Ambulatory Visit: Payer: Medicare PPO

## 2017-02-23 DIAGNOSIS — R2689 Other abnormalities of gait and mobility: Secondary | ICD-10-CM | POA: Diagnosis not present

## 2017-02-23 DIAGNOSIS — Z23 Encounter for immunization: Secondary | ICD-10-CM

## 2017-02-23 DIAGNOSIS — F331 Major depressive disorder, recurrent, moderate: Secondary | ICD-10-CM

## 2017-02-23 DIAGNOSIS — F4321 Adjustment disorder with depressed mood: Secondary | ICD-10-CM | POA: Diagnosis not present

## 2017-02-23 DIAGNOSIS — R2681 Unsteadiness on feet: Secondary | ICD-10-CM | POA: Diagnosis not present

## 2017-02-23 DIAGNOSIS — M545 Low back pain, unspecified: Secondary | ICD-10-CM

## 2017-02-23 DIAGNOSIS — R29898 Other symptoms and signs involving the musculoskeletal system: Secondary | ICD-10-CM

## 2017-02-23 DIAGNOSIS — R471 Dysarthria and anarthria: Secondary | ICD-10-CM

## 2017-02-23 DIAGNOSIS — R296 Repeated falls: Secondary | ICD-10-CM | POA: Diagnosis not present

## 2017-02-23 DIAGNOSIS — G8929 Other chronic pain: Secondary | ICD-10-CM | POA: Diagnosis not present

## 2017-02-23 DIAGNOSIS — R262 Difficulty in walking, not elsewhere classified: Secondary | ICD-10-CM

## 2017-02-23 DIAGNOSIS — R1312 Dysphagia, oropharyngeal phase: Secondary | ICD-10-CM | POA: Diagnosis not present

## 2017-02-23 DIAGNOSIS — M25551 Pain in right hip: Secondary | ICD-10-CM

## 2017-02-23 DIAGNOSIS — M6281 Muscle weakness (generalized): Secondary | ICD-10-CM

## 2017-02-23 DIAGNOSIS — R279 Unspecified lack of coordination: Secondary | ICD-10-CM

## 2017-02-23 DIAGNOSIS — R278 Other lack of coordination: Secondary | ICD-10-CM

## 2017-02-23 DIAGNOSIS — R293 Abnormal posture: Secondary | ICD-10-CM

## 2017-02-23 NOTE — Therapy (Signed)
[place] MAIN [facility] SERVICES [address] [place], [place], [address] Phone: [phone]   Fax:  [phone]  Physical Therapy Treatment  Patient Details  Name: [name] MRN: [id number] Date of Birth: [birth year] Referring Provider: Alonza Bogus, DO   Encounter Date: 02/23/2017  PT End of Session - 02/23/17 1320    Visit Number  4    Number of Visits  17    Date for PT Re-Evaluation  04/06/17    PT Start Time  0815    PT Stop Time  0910    PT Time Calculation (min)  55 min    Activity Tolerance  Patient tolerated treatment well    Behavior During Therapy  [facility] for tasks assessed/performed       Past Medical History:  Diagnosis Date  . Arthritis   . Bradycardia   . Cancer [facility]) 2013   skin cancer  . Depression    ptsd  . Dysrhythmia    chronic slow heart rate  . GERD (gastroesophageal reflux disease)   . Headache(784.0)    tension headaches non recent  . History of chicken pox   . History of kidney stones   . Hypertension    treated with HCTZ  . Parkinson's disease ([place])    dx'ed 15 years ago  . PTSD (post-traumatic stress disorder)   . Shortness of breath dyspnea   . Sleep apnea    doesn't use C-pap  . Varicose veins     Past Surgical History:  Procedure Laterality Date  . cyst removed      from lip as a child  . skin cancer removed     from ears,   12 lft arm  rt leg 14    There were no vitals filed for this visit.  Subjective Assessment - 02/23/17 1316    Subjective  Pt reports 2 falls yesterday; spouse notes pt does not stop and try and re group balance as we discussed last aquatic session, but speeds up to try and reach destination before falling. Pt is using rollator this date for ambulation. Pt denies problems of pain or decreased strength/over work symptoms post last session.     Patient is accompained by:  Family member    Pertinent History  Patient referred for physical therapy due to wife's request  for patient to get aquatic therapy to help with his conditioning. Pt is also complaining of chronic low back and R hip pain and frequent falls. 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 The last time he was in the water he was very uncomfortable because he didn't feel like he had any control. Pt has a history of 2 low back surgeries in August 2014 and February 2016. Pt and wife are unable to recall exactly what was performed but they know that he had stenosis. Surgeries were performed by Dr. Annette Stable in [place]. Pt reports that his low back and R hip pain are constant. He describes the pain in the back as "dull" and the pain in the hip as "sharp." For both back and hip worst pain is 10/10, Best: 2/10, Present: 2/10. No particular episode of trauma to his low back or hip preceding the pain. Pt has a history of Parkinson's with a deep brain stimulator. He ambulates with a 4 wheeled walker called a U-step (automatic brakes, audio cues for stepping). He states that he falls frequently  and reports that he has had approximately 40-50 falls in the last 6 months. No specific recent changes in his health.       Enters/exits pool via ramp Participates in the following Ambulation with kick boards and Min guard to Min A - 6 L fwd  - 6 L side step   Step ups with portable step, 20x - Lead right - Lead left  Balance  - Ball catch/throw, 5 min. Back toward bench - STS at bench, no UE use, 20x  Bench seated core/LE work - bicycle, 5 min - scissor, 3 min - flutter, 3 min   -                       PT Education - 02/23/17 1317    Education provided  Yes    Education Details  Re educated on safety of slowing pace with ambulation and stopping to re group balance when pt experiences loss of balance. Also educated and focused on improved heel to toe ambulation pattern as well as strengthening and coordination activities with good ranges  for LLE, which is weaker side. Educated through an exercise on proper STS technique    Person(s) Educated  Patient;Spouse    Methods  Explanation;Demonstration;Verbal cues;Tactile cues    Comprehension  Verbalized understanding;Returned demonstration;Verbal cues required;Tactile cues required;Need further instruction       PT Short Term Goals - 02/16/17 1324      PT SHORT TERM GOAL #1   Title  Pt will perform HEP with family's supervision, for improved balance, transfers, and gait.      Baseline  02/08/17: does not perform HEP    Time  4    Period  Weeks    Status  New        PT Long Term Goals - 02/16/17 1324      PT LONG TERM GOAL #1   Title  Pt will decrease 5TSTS by at least 3 seconds in order to demonstrate clinically significant improvement in LE strength     Baseline  02/08/17: 20.1 seconds    Time  8    Period  Weeks    Status  New      PT LONG TERM GOAL #2   Title  Pt will decrease TUG by at least 3.4s in order to demonstrate decreased fall risk     Baseline  02/08/17: 22.1s    Time  8    Period  Weeks    Status  New      PT LONG TERM GOAL #3   Title  Pt will improve BERG by at least 3 points in order to demonstrate clinically significant improvement in balance.    Baseline  02/08/17: 30/56    Time  8    Period  Weeks    Status  New      PT LONG TERM GOAL #4   Title  Pt/wife will verbalize understanding of plans for continued community fitness upon D/C from PT including aquatic therapy program    Baseline  02/08/17: pt currently reports being uncomfortable in the water    Time  8    Period  Weeks    Status  New      PT LONG TERM GOAL #5   Title  Pt will decrease mODI scoreby at least 13 points in order demonstrate clinically significant reduction in pain/disability     Baseline  02/08/17: 54%    Time  8  Period  Weeks    Status  New            Plan - 02/23/17 1320    Clinical Impression Statement  Pt initiated step up activities this session  as well as focus on STS without UE assist for greater use of core/LEs. Pt requires cues for improved follow through with slowed pace and halting activity to attain balance before continuing activity. Pt does show some improvement with cues and is noted to require less assist overall when ambulating.     Rehab Potential  Fair    PT Frequency  2x / week    PT Duration  8 weeks    PT Treatment/Interventions  ADLs/Self Care Home Management;Aquatic Therapy;Cryotherapy;Electrical Stimulation;Iontophoresis 4mg /ml Dexamethasone;Moist Heat;Ultrasound;Contrast Bath;DME Instruction;Gait training;Stair training;Functional mobility training;Therapeutic activities;Therapeutic exercise;Balance training;Neuromuscular re-education;Patient/family education;Manual techniques;Wheelchair mobility training;Energy conservation    PT Next Visit Plan  progress balance, strength, aquatic therapy    PT Home Exercise Plan  None provided at this time until further assessed, wife reports pt does not perform current HEP from [facility] PT    Consulted and Agree with Plan of Care  Patient;Family member/caregiver    Family Member Consulted  wife       Patient will benefit from skilled therapeutic intervention in order to improve the following deficits and impairments:  Abnormal gait, Decreased balance, Decreased coordination, Decreased mobility, Impaired tone, Postural dysfunction  Visit Diagnosis: Unsteadiness on feet  Repeated falls  Chronic midline low back pain without sciatica  Pain in right hip  Unspecified lack of coordination  Difficulty walking  Muscle weakness (generalized)  Abnormal posture  Other lack of coordination  Other symptoms and signs involving the musculoskeletal system  Dysarthria and anarthria     Problem List Patient Active Problem List   Diagnosis Date Noted  . Arm injury, right, initial encounter 11/15/2016  . 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 Radicular pain in right arm 10/09/2016   . Rash 09/03/2016  . GERD (gastroesophageal reflux disease) 07/31/2016  . Trochanteric bursitis 03/03/2016  . Left hand pain 10/30/2015  . Fracture, finger, multiple sites 10/30/2015  . Colon cancer screening 07/23/2015  . Healthcare maintenance 07/23/2015  . Gout 02/18/2015  . Depression 01/06/2015  . Irritation of eyelid 08/20/2014  . Dupuytren's contracture 08/20/2014  . Cough 07/21/2014  . Lumbar stenosis with neurogenic claudication 06/13/2014  . Preop cardiovascular exam 05/28/2014  . S/P deep brain stimulator placement 05/08/2014  . Joint pain 01/22/2014  . Medicare annual wellness visit, initial 01/19/2014  . Advance care planning 01/19/2014  . Parkinson's disease ([place]) 12/13/2013  . PTSD (post-traumatic stress disorder) 06/13/2013  . Erectile dysfunction 06/13/2013  . HLD (hyperlipidemia) 06/13/2013  . Hip pain 06/10/2013  . Pain in joint, shoulder region 06/10/2013  . Right leg swelling 06/03/2013  . Essential hypertension 06/03/2013  . Bradycardia by electrocardiogram 06/03/2013  . Obstructive sleep apnea 03/12/2013  . Spinal stenosis, lumbar region, with neurogenic claudication 12/14/2012    Larae Grooms 02/23/2017, 1:32 PM  [place] MAIN [facility] SERVICES [address] [place], [place], [address] Phone: [phone]   Fax:  [phone]  Name: [name] MRN: [id number] Date of Birth: Feb 15, 1951

## 2017-02-27 ENCOUNTER — Encounter: Payer: Self-pay | Admitting: Physical Therapy

## 2017-02-27 ENCOUNTER — Other Ambulatory Visit: Payer: Self-pay | Admitting: Neurology

## 2017-02-27 ENCOUNTER — Other Ambulatory Visit: Payer: Self-pay

## 2017-02-27 ENCOUNTER — Ambulatory Visit: Payer: Medicare PPO | Admitting: Physical Therapy

## 2017-02-27 VITALS — BP 151/68 | HR 47

## 2017-02-27 DIAGNOSIS — M545 Low back pain, unspecified: Secondary | ICD-10-CM

## 2017-02-27 DIAGNOSIS — R279 Unspecified lack of coordination: Secondary | ICD-10-CM | POA: Diagnosis not present

## 2017-02-27 DIAGNOSIS — G8929 Other chronic pain: Secondary | ICD-10-CM | POA: Diagnosis not present

## 2017-02-27 DIAGNOSIS — R471 Dysarthria and anarthria: Secondary | ICD-10-CM | POA: Diagnosis not present

## 2017-02-27 DIAGNOSIS — R296 Repeated falls: Secondary | ICD-10-CM

## 2017-02-27 DIAGNOSIS — M25551 Pain in right hip: Secondary | ICD-10-CM | POA: Diagnosis not present

## 2017-02-27 DIAGNOSIS — R2681 Unsteadiness on feet: Secondary | ICD-10-CM

## 2017-02-27 DIAGNOSIS — R2689 Other abnormalities of gait and mobility: Secondary | ICD-10-CM | POA: Diagnosis not present

## 2017-02-27 DIAGNOSIS — R1312 Dysphagia, oropharyngeal phase: Secondary | ICD-10-CM | POA: Diagnosis not present

## 2017-02-27 NOTE — Therapy (Signed)
[place] MAIN [facility] SERVICES [address] [place], [place], [address] Phone: [phone]   Fax:  [phone]  Physical Therapy Treatment  Patient Details  Name: [name] MRN: [id number] Date of Birth: 1951/02/10 Referring Provider: Alonza Bogus, DO   Encounter Date: 02/27/2017  PT End of Session - 02/27/17 0758    Visit Number  5    Number of Visits  17    Date for PT Re-Evaluation  04/06/17    PT Start Time  0759    PT Stop Time  0842    PT Time Calculation (min)  43 min    Activity Tolerance  Patient tolerated treatment well    Behavior During Therapy  [facility] for tasks assessed/performed       Past Medical History:  Diagnosis Date  . Arthritis   . Bradycardia   . Cancer [facility]) 2013   skin cancer  . Depression    ptsd  . Dysrhythmia    chronic slow heart rate  . GERD (gastroesophageal reflux disease)   . Headache(784.0)    tension headaches non recent  . History of chicken pox   . History of kidney stones   . Hypertension    treated with HCTZ  . Parkinson's disease ([place])    dx'ed 15 years ago  . PTSD (post-traumatic stress disorder)   . Shortness of breath dyspnea   . Sleep apnea    doesn't use C-pap  . Varicose veins     Past Surgical History:  Procedure Laterality Date  . cyst removed      from lip as a child  . skin cancer removed     from ears,   12 lft arm  rt leg 14    Vitals:   02/27/17 0806  BP: (!) 151/68  Pulse: (!) 47  SpO2: 99%    Subjective Assessment - 02/27/17 0806    Subjective  Pt reports he fell yesterday and felt like he blacked out (pt unable to specify if he blacked out before or after fall).  Pt reports he hit his head on a piece of furniture which resulted in a headache which has since resolved.  He fell on his R side with no additional pain past his baseline.  Additionally he fell 2x the day after last session.   No other complaints or concerns.  Pt has been completing his HEP 1x/day  without questions at this time.     Patient is accompained by:  Family member    Pertinent History  Patient referred for physical therapy due to wife's request for patient to get aquatic therapy to help with his conditioning. Pt is also complaining of chronic low back and R hip pain and frequent falls. 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 The last time he was in the water he was very uncomfortable because he didn't feel like he had any control. Pt has a history of 2 low back surgeries in August 2014 and February 2016. Pt and wife are unable to recall exactly what was performed but they know that he had stenosis. Surgeries were performed by Dr. Annette Stable in [place]. Pt reports that his low back and R hip pain are constant. He describes the pain in the back as "dull" and the pain in the hip as "sharp." For both back and hip worst pain is 10/10, Best: 2/10, Present: 2/10. No particular episode  of trauma to his low back or hip preceding the pain. Pt has a history of Parkinson's with a deep brain stimulator. He ambulates with a 4 wheeled walker called a U-step (automatic brakes, audio cues for stepping). He states that he falls frequently and reports that he has had approximately 40-50 falls in the last 6 months. No specific recent changes in his health.     Currently in Pain?  Yes    Pain Score  5     Pain Location  Shoulder    Pain Orientation  Right    Pain Descriptors / Indicators  Aching    Pain Type  Acute pain    Pain Onset  In the past 7 days    Multiple Pain Sites  No        TREATMENT   Therapeutic Exercise:  TrA activation seated position 10x5 second holds  LAQ 10x each LE with 5# ankle weights and 5 second holds  Marching in sitting with 5# ankle weights x15 each LE. Cues to slowly lower feet to floor rather than letting them stomp    Neuro Re-ed:  Walking on balance stones in // bars x2 minutes  Lateral stepping over cone  onto/from airex pad in // bars. Pt requires intermittent UE support and min assist to remain steady.  Step up/down to double airex pad with min assist to prevent posterior LOB with step down  Weight shifting ant/post on firm surface with facilitation at hips and max verbal cues and demonstration anterior weight shifting. Pt has tendency to achieve weight shift by performing trunk flexion. Explained the connection between his difficulty in weight shifting anteriorly to his falls posteriorly.       PT Education - 02/27/17 0757    Education provided  Yes    Education Details  Exercise technique    Person(s) Educated  Patient    Methods  Explanation;Demonstration;Verbal cues    Comprehension  Verbalized understanding;Returned demonstration;Verbal cues required;Need further instruction       PT Short Term Goals - 02/16/17 1324      PT SHORT TERM GOAL #1   Title  Pt will perform HEP with family's supervision, for improved balance, transfers, and gait.      Baseline  02/08/17: does not perform HEP    Time  4    Period  Weeks    Status  New        PT Long Term Goals - 02/16/17 1324      PT LONG TERM GOAL #1   Title  Pt will decrease 5TSTS by at least 3 seconds in order to demonstrate clinically significant improvement in LE strength     Baseline  02/08/17: 20.1 seconds    Time  8    Period  Weeks    Status  New      PT LONG TERM GOAL #2   Title  Pt will decrease TUG by at least 3.4s in order to demonstrate decreased fall risk     Baseline  02/08/17: 22.1s    Time  8    Period  Weeks    Status  New      PT LONG TERM GOAL #3   Title  Pt will improve BERG by at least 3 points in order to demonstrate clinically significant improvement in balance.    Baseline  02/08/17: 30/56    Time  8    Period  Weeks    Status  New  PT LONG TERM GOAL #4   Title  Pt/wife will verbalize understanding of plans for continued community fitness upon D/C from PT including aquatic therapy  program    Baseline  02/08/17: pt currently reports being uncomfortable in the water    Time  8    Period  Weeks    Status  New      PT LONG TERM GOAL #5   Title  Pt will decrease mODI scoreby at least 13 points in order demonstrate clinically significant reduction in pain/disability     Baseline  02/08/17: 54%    Time  8    Period  Weeks    Status  New            Plan - 02/27/17 0843    Clinical Impression Statement  Pt demonstrates fatigue with progression of strengthening exercises and requires verbal cues for correct technique to activate TrA.  He consistently demonstrates a posterior LOB with balance exercises and reports that when he does lose his balance and fall it is typically posteriorly.  Introduced corrective anterior weight shifting and demonstrated and explained the connection between practicing this and implementing it to prevent posterior LOB.  Pt will benefit from further training with this as well as additional balance and strength training for improved balance and decreased risk of falling.     Rehab Potential  Fair    PT Frequency  2x / week    PT Duration  8 weeks    PT Treatment/Interventions  ADLs/Self Care Home Management;Aquatic Therapy;Cryotherapy;Electrical Stimulation;Iontophoresis 4mg /ml Dexamethasone;Moist Heat;Ultrasound;Contrast Bath;DME Instruction;Gait training;Stair training;Functional mobility training;Therapeutic activities;Therapeutic exercise;Balance training;Neuromuscular re-education;Patient/family education;Manual techniques;Wheelchair mobility training;Energy conservation    PT Next Visit Plan  progress balance, strength, aquatic therapy    PT Home Exercise Plan  None provided at this time until further assessed, wife reports pt does not perform current HEP from [facility] PT    Consulted and Agree with Plan of Care  Patient;Family member/caregiver    Family Member Consulted  wife       Patient will benefit from skilled therapeutic intervention in  order to improve the following deficits and impairments:  Abnormal gait, Decreased balance, Decreased coordination, Decreased mobility, Impaired tone, Postural dysfunction  Visit Diagnosis: Other abnormalities of gait and mobility  Unsteadiness on feet  Repeated falls  Chronic midline low back pain without sciatica     Problem List Patient Active Problem List   Diagnosis Date Noted  . Arm injury, right, initial encounter 11/15/2016  . 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 Radicular pain in right arm 10/09/2016  . Rash 09/03/2016  . GERD (gastroesophageal reflux disease) 07/31/2016  . Trochanteric bursitis 03/03/2016  . Left hand pain 10/30/2015  . Fracture, finger, multiple sites 10/30/2015  . Colon cancer screening 07/23/2015  . Healthcare maintenance 07/23/2015  . Gout 02/18/2015  . Depression 01/06/2015  . Irritation of eyelid 08/20/2014  . Dupuytren's contracture 08/20/2014  . Cough 07/21/2014  . Lumbar stenosis with neurogenic claudication 06/13/2014  . Preop cardiovascular exam 05/28/2014  . S/P deep brain stimulator placement 05/08/2014  . Joint pain 01/22/2014  . Medicare annual wellness visit, initial 01/19/2014  . Advance care planning 01/19/2014  . Parkinson's disease ([place]) 12/13/2013  . PTSD (post-traumatic stress disorder) 06/13/2013  . Erectile dysfunction 06/13/2013  . HLD (hyperlipidemia) 06/13/2013  . Hip pain 06/10/2013  . Pain in joint, shoulder region 06/10/2013  . Right leg swelling 06/03/2013  . Essential hypertension 06/03/2013  . Bradycardia by electrocardiogram 06/03/2013  .  Obstructive sleep apnea 03/12/2013  . Spinal stenosis, lumbar region, with neurogenic claudication 12/14/2012    Collie Siad PT, DPT 02/27/2017, 8:45 AM  [place] MAIN [facility] SERVICES [place], [place], [address] Phone: [phone]   Fax:  [phone]  Name: EAVEN SCHWAGER MRN: [id number] Date of Birth:  1951-03-11

## 2017-02-28 ENCOUNTER — Ambulatory Visit: Payer: Medicare PPO

## 2017-02-28 DIAGNOSIS — Z872 Personal history of diseases of the skin and subcutaneous tissue: Secondary | ICD-10-CM | POA: Diagnosis not present

## 2017-02-28 DIAGNOSIS — G4733 Obstructive sleep apnea (adult) (pediatric): Secondary | ICD-10-CM

## 2017-02-28 DIAGNOSIS — L578 Other skin changes due to chronic exposure to nonionizing radiation: Secondary | ICD-10-CM | POA: Diagnosis not present

## 2017-02-28 DIAGNOSIS — Z86018 Personal history of other benign neoplasm: Secondary | ICD-10-CM | POA: Diagnosis not present

## 2017-02-28 DIAGNOSIS — L821 Other seborrheic keratosis: Secondary | ICD-10-CM | POA: Diagnosis not present

## 2017-02-28 NOTE — Procedures (Signed)
Patient Name: [name], Pastorino Date: 02/22/2017 Gender: Male D.O.B: 12-Dec-1950 Age (years): 28 Referring Provider: Baird Lyons MD, ABSM Height (inches): 69 Interpreting Physician: Baird Lyons MD, ABSM Weight (lbs): 180 RPSGT: Zadie Rhine BMI: 27 MRN: [id number] Neck Size: 16.50 CLINICAL INFORMATION Sleep Study Type: Split Night CPAP  Indication for sleep study: Snoring, Witnesses Apnea / Gasping During Sleep  Epworth Sleepiness Score: 17  SLEEP STUDY TECHNIQUE As per the AASM Manual for the Scoring of Sleep and Associated Events v2.3 (April 2016) with a hypopnea requiring 4% desaturations.  The channels recorded and monitored were frontal, central and occipital EEG, electrooculogram (EOG), submentalis EMG (chin), nasal and oral airflow, thoracic and abdominal wall motion, anterior tibialis EMG, snore microphone, electrocardiogram, and pulse oximetry. Continuous positive airway pressure (CPAP) was initiated when the patient met split night criteria and was titrated according to treat sleep-disordered breathing.  MEDICATIONS Medications self-administered by patient taken the night of the study : BUPROPION, CHOLECALCIFEROL, LEXAPRO, NAMENDA, PRAVASTATIN  RESPIRATORY PARAMETERS Diagnostic  Total AHI (/hr): 34.4 RDI (/hr): 34.4 OA Index (/hr): 4.8 CA Index (/hr): 0.0 REM AHI (/hr): N/A NREM AHI (/hr): 34.4 Supine AHI (/hr): 34.4 Non-supine AHI (/hr): N/A Min O2 Sat (%): 88.00 Mean O2 (%): 94.55 Time below 88% (min): 0.1   Titration  Optimal Pressure (cm): 12 AHI at Optimal Pressure (/hr): 0.0 Min O2 at Optimal Pressure (%): 94.0 Supine % at Optimal (%): 100 Sleep % at Optimal (%): 98   SLEEP ARCHITECTURE The recording time for the entire night was 398.4 minutes.  During a baseline period of 245.0 minutes, the patient slept for 137.8 minutes in REM and nonREM, yielding a sleep efficiency of 56.3%. Sleep onset after lights out was 68.2 minutes with a REM latency of N/A  minutes. The patient spent 8.71% of the night in stage N1 sleep, 91.29% in stage N2 sleep, 0.00% in stage N3 and 0.00% in REM.  During the titration period of 148.0 minutes, the patient slept for 142.0 minutes in REM and nonREM, yielding a sleep efficiency of 96.0%. Sleep onset after CPAP initiation was 2.3 minutes with a REM latency of 29.5 minutes. The patient spent 5.28% of the night in stage N1 sleep, 59.15% in stage N2 sleep, 0.00% in stage N3 and 35.56% in REM.  CARDIAC DATA The 2 lead EKG demonstrated sinus rhythm. The mean heart rate was 66.66 beats per minute. Other EKG findings include: None.  LEG MOVEMENT DATA The total Periodic Limb Movements of Sleep (PLMS) were 34. The PLMS index was 7.23 .  IMPRESSIONS - Severe obstructive sleep apnea occurred during the diagnostic portion of the study (AHI = 34.4/hour). An optimal PAP pressure was selected for this patient ( 12 cm of water) - No significant central sleep apnea occurred during the diagnostic portion of the study (CAI = 0.0/hour). - The patient had minimal or no oxygen desaturation during the diagnostic portion of the study (Min O2 = 88.00%) - Brain-stimulator artifact over-rode EEG and EKG much of the study. - No snoring was audible during the diagnostic portion of the study. - No cardiac abnormalities were noted during this study. - Mild periodic limb movements of sleep occurred during the study.  DIAGNOSIS - Obstructive Sleep Apnea (327.23 [G47.33 ICD-10])  RECOMMENDATIONS - Trial of CPAP therapy on 12 cm H2O with a Small size Resmed Full Face Mask AirFit F20 mask and heated humidification. - Be careful with alcohol, sedatives and other CNS depressants that may worsen sleep apnea and disrupt normal  sleep architecture. - Sleep hygiene should be reviewed to assess factors that may improve sleep quality. - Weight management and regular exercise should be initiated or continued.  [Electronically signed] 02/28/2017 08:38  AM  Baird Lyons MD, [place], American Board of Sleep Medicine   NPI: [id number]

## 2017-03-02 ENCOUNTER — Ambulatory Visit: Payer: Medicare PPO

## 2017-03-07 ENCOUNTER — Ambulatory Visit: Payer: Medicare PPO

## 2017-03-14 ENCOUNTER — Ambulatory Visit: Payer: Medicare PPO

## 2017-03-14 DIAGNOSIS — R296 Repeated falls: Secondary | ICD-10-CM | POA: Diagnosis not present

## 2017-03-14 DIAGNOSIS — R2689 Other abnormalities of gait and mobility: Secondary | ICD-10-CM | POA: Diagnosis not present

## 2017-03-14 DIAGNOSIS — R29898 Other symptoms and signs involving the musculoskeletal system: Secondary | ICD-10-CM

## 2017-03-14 DIAGNOSIS — M6281 Muscle weakness (generalized): Secondary | ICD-10-CM

## 2017-03-14 DIAGNOSIS — M25551 Pain in right hip: Secondary | ICD-10-CM

## 2017-03-14 DIAGNOSIS — R278 Other lack of coordination: Secondary | ICD-10-CM

## 2017-03-14 DIAGNOSIS — M545 Low back pain, unspecified: Secondary | ICD-10-CM

## 2017-03-14 DIAGNOSIS — R471 Dysarthria and anarthria: Secondary | ICD-10-CM

## 2017-03-14 DIAGNOSIS — R279 Unspecified lack of coordination: Secondary | ICD-10-CM | POA: Diagnosis not present

## 2017-03-14 DIAGNOSIS — R1312 Dysphagia, oropharyngeal phase: Secondary | ICD-10-CM | POA: Diagnosis not present

## 2017-03-14 DIAGNOSIS — R262 Difficulty in walking, not elsewhere classified: Secondary | ICD-10-CM

## 2017-03-14 DIAGNOSIS — G8929 Other chronic pain: Secondary | ICD-10-CM | POA: Diagnosis not present

## 2017-03-14 DIAGNOSIS — R293 Abnormal posture: Secondary | ICD-10-CM

## 2017-03-14 DIAGNOSIS — R2681 Unsteadiness on feet: Secondary | ICD-10-CM | POA: Diagnosis not present

## 2017-03-14 NOTE — Therapy (Signed)
[place] MAIN [facility] SERVICES [address] [place], [place], [address] Phone: [phone]   Fax:  [phone]  Physical Therapy Treatment  Patient Details  Name: [name] MRN: [id number] Date of Birth: 1950-06-21 Referring Provider: Alonza Bogus, DO   Encounter Date: 03/14/2017  PT End of Session - 03/14/17 1352    Visit Number  6    Number of Visits  17    Date for PT Re-Evaluation  04/06/17    PT Start Time  0805    PT Stop Time  0900    PT Time Calculation (min)  55 min    Activity Tolerance  Patient tolerated treatment well    Behavior During Therapy  [facility] for tasks assessed/performed       Past Medical History:  Diagnosis Date  . Arthritis   . Bradycardia   . Cancer [facility]) 2013   skin cancer  . Depression    ptsd  . Dysrhythmia    chronic slow heart rate  . GERD (gastroesophageal reflux disease)   . Headache(784.0)    tension headaches non recent  . History of chicken pox   . History of kidney stones   . Hypertension    treated with HCTZ  . Parkinson's disease ([place])    dx'ed 15 years ago  . PTSD (post-traumatic stress disorder)   . Shortness of breath dyspnea   . Sleep apnea    doesn't use C-pap  . Varicose veins     Past Surgical History:  Procedure Laterality Date  . CHOLECYSTECTOMY N/A 10/22/2014   Procedure: LAPAROSCOPIC CHOLECYSTECTOMY WITH INTRAOPERATIVE CHOLANGIOGRAM;  Surgeon: Dia Crawford III, MD;  Location: [facility] ORS;  Service: General;  Laterality: N/A;  . cyst removed      from lip as a child  . LUMBAR LAMINECTOMY/DECOMPRESSION MICRODISCECTOMY Bilateral 12/14/2012   Procedure: Bilateral lumbar three-four, four-five decompressive laminotomy/foraminotomy;  Surgeon: Charlie Pitter, MD;  Location: [place] NEURO ORS;  Service: Neurosurgery;  Laterality: Bilateral;  . PULSE GENERATOR IMPLANT Bilateral 12/13/2013   Procedure: Bilateral implantable pulse generator placement;  Surgeon: Erline Levine, MD;  Location: [name]  NEURO ORS;  Service: Neurosurgery;  Laterality: Bilateral;  Bilateral implantable pulse generator placement  . skin cancer removed     from ears,   12 lft arm  rt leg 15  . SUBTHALAMIC STIMULATOR INSERTION Bilateral 12/06/2013   Procedure: SUBTHALAMIC STIMULATOR INSERTION;  Surgeon: Erline Levine, MD;  Location: [place] NEURO ORS;  Service: Neurosurgery;  Laterality: Bilateral;  Bilateral deep brain stimulator placement    There were no vitals filed for this visit.  Subjective Assessment - 03/14/17 1343    Subjective  No new voiced complaints. Spouse prepared to joing session in the water to better understand exercises and how to assist pt. Discussed continuation of aquatic therapy and both pt and spouse agree they would like to continue and feel therapy is benefiting pt.'s strength and would like to see continued results with strength and balance     Pertinent History  Patient referred for physical therapy due to wife's request for patient to get aquatic therapy to help with his conditioning. Pt is also complaining of chronic low back and R hip pain and frequent falls. 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 The last time he was in the water he was very uncomfortable because he didn't feel like he had any control. Pt has a history of  2 low back surgeries in August 2014 and February 2016. Pt and wife are unable to recall exactly what was performed but they know that he had stenosis. Surgeries were performed by Dr. Annette Stable in [place]. Pt reports that his low back and R hip pain are constant. He describes the pain in the back as "dull" and the pain in the hip as "sharp." For both back and hip worst pain is 10/10, Best: 2/10, Present: 2/10. No particular episode of trauma to his low back or hip preceding the pain. Pt has a history of Parkinson's with a deep brain stimulator. He ambulates with a 4 wheeled walker called a U-step (automatic brakes, audio cues  for stepping). He states that he falls frequently and reports that he has had approximately 40-50 falls in the last 6 months. No specific recent changes in his health.       Pt enters/exits pool via ramp Participates in the following  Ambulation, no UE support - 4 L fwd - 4 L side   Core ex's with UE, with green dumbbells, 20x ea  - triceps press down - sh flex/ext, 1 dumbbell - sh abd/add  Core ex's with UE speed for resistance, 20x ea - sh flex/ext - sh abd/add - sh horizontal abd/add  Bench exercises; core, 3 min each - bike - flutter - scissor  Bench squat to stand with no UE assist, 20x  Ball toss/catch for balance, 6 minutes  Core ex's with LE in stand, 20x ea - Hip flex/ext - Hip abd/add                       PT Education - 03/14/17 1350    Education provided  Yes    Education Details  Core strengthening with UE exercises; continued education on core strength with lower extremity exercises and bench exercises. Continued education on importance of working on quality of movement including range and specific techniques to attain desired outcomes.        PT Short Term Goals - 03/14/17 1355      PT SHORT TERM GOAL #1   Title  Pt will perform HEP with family's supervision, for improved balance, transfers, and gait.      Baseline  02/08/17: does not perform HEP    Time  4    Period  Weeks    Status  New        PT Long Term Goals - 03/14/17 1356      PT LONG TERM GOAL #1   Title  Pt will decrease 5TSTS by at least 3 seconds in order to demonstrate clinically significant improvement in LE strength     Baseline  02/08/17: 20.1 seconds    Time  8    Period  Weeks    Status  New      PT LONG TERM GOAL #2   Title  Pt will decrease TUG by at least 3.4s in order to demonstrate decreased fall risk     Baseline  02/08/17: 22.1s    Time  8    Period  Weeks    Status  New      PT LONG TERM GOAL #3   Title  Pt will improve BERG by at least 3  points in order to demonstrate clinically significant improvement in balance.    Baseline  02/08/17: 30/56    Time  8    Period  Weeks    Status  New  PT LONG TERM GOAL #4   Title  Pt/wife will verbalize understanding of plans for continued community fitness upon D/C from PT including aquatic therapy program    Baseline  02/08/17: pt currently reports being uncomfortable in the water    Time  8    Period  Weeks    Status  New      PT LONG TERM GOAL #5   Title  Pt will decrease mODI scoreby at least 13 points in order demonstrate clinically significant reduction in pain/disability     Baseline  02/08/17: 54%    Time  8    Period  Weeks    Status  New            Plan - 03/14/17 1352    Clinical Impression Statement  Pt continues to require consistent cueing throughout exercises for appropriate speed, techniques and quality of movement for best benefit. Spouse educated on all areas to better assist pt, but will need continued education as well for best comprehension to assist pt. Pt to have land session Thursday, 03/16/17 and will discuss continuation with primary therapist.      Rehab Potential  Fair    PT Frequency  2x / week    PT Duration  8 weeks    PT Treatment/Interventions  ADLs/Self Care Home Management;Aquatic Therapy;Cryotherapy;Electrical Stimulation;Iontophoresis 4mg /ml Dexamethasone;Moist Heat;Ultrasound;Contrast Bath;DME Instruction;Gait training;Stair training;Functional mobility training;Therapeutic activities;Therapeutic exercise;Balance training;Neuromuscular re-education;Patient/family education;Manual techniques;Wheelchair mobility training;Energy conservation    PT Next Visit Plan  progress balance, strength, aquatic therapy    PT Home Exercise Plan  None provided at this time until further assessed, wife reports pt does not perform current HEP from [facility] PT    Consulted and Agree with Plan of Care  Patient;Family member/caregiver    Family Member Consulted   wife       Patient will benefit from skilled therapeutic intervention in order to improve the following deficits and impairments:  Abnormal gait, Decreased balance, Decreased coordination, Decreased mobility, Impaired tone, Postural dysfunction  Visit Diagnosis: Other abnormalities of gait and mobility  Unsteadiness on feet  Repeated falls  Chronic midline low back pain without sciatica  Pain in right hip  Unspecified lack of coordination  Difficulty walking  Muscle weakness (generalized)  Abnormal posture  Other lack of coordination  Other symptoms and signs involving the musculoskeletal system  Dysarthria and anarthria     Problem List Patient Active Problem List   Diagnosis Date Noted  . Arm injury, right, initial encounter 11/15/2016  . 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 Radicular pain in right arm 10/09/2016  . Rash 09/03/2016  . GERD (gastroesophageal reflux disease) 07/31/2016  . Trochanteric bursitis 03/03/2016  . Left hand pain 10/30/2015  . Fracture, finger, multiple sites 10/30/2015  . Colon cancer screening 07/23/2015  . Healthcare maintenance 07/23/2015  . Gout 02/18/2015  . Depression 01/06/2015  . Irritation of eyelid 08/20/2014  . Dupuytren's contracture 08/20/2014  . Cough 07/21/2014  . Lumbar stenosis with neurogenic claudication 06/13/2014  . Preop cardiovascular exam 05/28/2014  . S/P deep brain stimulator placement 05/08/2014  . Joint pain 01/22/2014  . Medicare annual wellness visit, initial 01/19/2014  . Advance care planning 01/19/2014  . Parkinson's disease ([place]) 12/13/2013  . PTSD (post-traumatic stress disorder) 06/13/2013  . Erectile dysfunction 06/13/2013  . HLD (hyperlipidemia) 06/13/2013  . Hip pain 06/10/2013  . Pain in joint, shoulder region 06/10/2013  . Right leg swelling 06/03/2013  . Essential hypertension 06/03/2013  . Bradycardia by  electrocardiogram 06/03/2013  . Obstructive sleep apnea 03/12/2013  . Spinal  stenosis, lumbar region, with neurogenic claudication 12/14/2012    Larae Grooms 03/14/2017, 2:00 PM  [place] MAIN [facility] SERVICES [place], [place], [address] Phone: [phone]   Fax:  [phone]  Name: TYJON BOWEN MRN: [id number] Date of Birth: 06-16-1950

## 2017-03-16 ENCOUNTER — Ambulatory Visit: Payer: Medicare PPO

## 2017-03-16 DIAGNOSIS — R2689 Other abnormalities of gait and mobility: Secondary | ICD-10-CM | POA: Diagnosis not present

## 2017-03-16 DIAGNOSIS — M545 Low back pain: Secondary | ICD-10-CM | POA: Diagnosis not present

## 2017-03-16 DIAGNOSIS — R279 Unspecified lack of coordination: Secondary | ICD-10-CM | POA: Diagnosis not present

## 2017-03-16 DIAGNOSIS — R2681 Unsteadiness on feet: Secondary | ICD-10-CM | POA: Diagnosis not present

## 2017-03-16 DIAGNOSIS — M6281 Muscle weakness (generalized): Secondary | ICD-10-CM

## 2017-03-16 DIAGNOSIS — R296 Repeated falls: Secondary | ICD-10-CM | POA: Diagnosis not present

## 2017-03-16 DIAGNOSIS — G8929 Other chronic pain: Secondary | ICD-10-CM | POA: Diagnosis not present

## 2017-03-16 DIAGNOSIS — M25551 Pain in right hip: Secondary | ICD-10-CM | POA: Diagnosis not present

## 2017-03-16 DIAGNOSIS — R471 Dysarthria and anarthria: Secondary | ICD-10-CM | POA: Diagnosis not present

## 2017-03-16 DIAGNOSIS — R1312 Dysphagia, oropharyngeal phase: Secondary | ICD-10-CM | POA: Diagnosis not present

## 2017-03-16 NOTE — Therapy (Signed)
[place] MAIN [facility] SERVICES [address] [place], [place], [address] Phone: [phone]   Fax:  [phone]  Physical Therapy Treatment  Patient Details  Name: [name] MRN: [id number] Date of Birth: 1950-08-04 Referring Provider: Alonza Bogus, DO   Encounter Date: 03/16/2017  PT End of Session - 03/16/17 1617    Visit Number  7    Number of Visits  17    Date for PT Re-Evaluation  04/06/17    PT Start Time  0350    PT Stop Time  1430    PT Time Calculation (min)  45 min    Activity Tolerance  Patient tolerated treatment well    Behavior During Therapy  [facility] for tasks assessed/performed       Past Medical History:  Diagnosis Date  . Arthritis   . Bradycardia   . Cancer [facility]) 2013   skin cancer  . Depression    ptsd  . Dysrhythmia    chronic slow heart rate  . GERD (gastroesophageal reflux disease)   . Headache(784.0)    tension headaches non recent  . History of chicken pox   . History of kidney stones   . Hypertension    treated with HCTZ  . Parkinson's disease ([place])    dx'ed 15 years ago  . PTSD (post-traumatic stress disorder)   . Shortness of breath dyspnea   . Sleep apnea    doesn't use C-pap  . Varicose veins     Past Surgical History:  Procedure Laterality Date  . CHOLECYSTECTOMY N/A 10/22/2014   Procedure: LAPAROSCOPIC CHOLECYSTECTOMY WITH INTRAOPERATIVE CHOLANGIOGRAM;  Surgeon: Dia Crawford III, MD;  Location: [facility] ORS;  Service: General;  Laterality: N/A;  . cyst removed      from lip as a child  . LUMBAR LAMINECTOMY/DECOMPRESSION MICRODISCECTOMY Bilateral 12/14/2012   Procedure: Bilateral lumbar three-four, four-five decompressive laminotomy/foraminotomy;  Surgeon: Charlie Pitter, MD;  Location: [place] NEURO ORS;  Service: Neurosurgery;  Laterality: Bilateral;  . PULSE GENERATOR IMPLANT Bilateral 12/13/2013   Procedure: Bilateral implantable pulse generator placement;  Surgeon: Erline Levine, MD;  Location: [place]  NEURO ORS;  Service: Neurosurgery;  Laterality: Bilateral;  Bilateral implantable pulse generator placement  . skin cancer removed     from ears,   12 lft arm  rt leg 15  . SUBTHALAMIC STIMULATOR INSERTION Bilateral 12/06/2013   Procedure: SUBTHALAMIC STIMULATOR INSERTION;  Surgeon: Erline Levine, MD;  Location: [place] NEURO ORS;  Service: Neurosurgery;  Laterality: Bilateral;  Bilateral deep brain stimulator placement    There were no vitals filed for this visit.  Subjective Assessment - 03/16/17 1353    Subjective  Patient fell three times, twice yesterday, once today. two in bathroom, one while sitting on stool dressing. has a headache right now. Headache after the fall.     Pertinent History  Patient referred for physical therapy due to wife's request for patient to get aquatic therapy to help with his conditioning. Pt is also complaining of chronic low back and R hip pain and frequent falls. 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 The last time he was in the water he was very uncomfortable because he didn't feel like he had any control. Pt has a history of 2 low back surgeries in August 2014 and February 2016. Pt and wife are unable to recall exactly what was performed but they know that he had stenosis. Surgeries  were performed by Dr. Annette Stable in [place]. Pt reports that his low back and R hip pain are constant. He describes the pain in the back as "dull" and the pain in the hip as "sharp." For both back and hip worst pain is 10/10, Best: 2/10, Present: 2/10. No particular episode of trauma to his low back or hip preceding the pain. Pt has a history of Parkinson's with a deep brain stimulator. He ambulates with a 4 wheeled walker called a U-step (automatic brakes, audio cues for stepping). He states that he falls frequently and reports that he has had approximately 40-50 falls in the last 6 months. No specific recent changes in his health.      Currently in Pain?  Yes    Pain Score  6     Pain Location  Head    Pain Orientation  Anterior    Pain Descriptors / Indicators  Headache    Pain Type  Acute pain    Pain Onset  Yesterday       Spo02 98  Pulse 56 BP 120/66    Visual scan: H good follow of eyes, good follow in as well.  Pupil light response normal Face sensation normal Trigeminal nerve and facial nerve test normal   TherEx rotational seated with weighted ball taps on other side 5000 g 12x  seated UE raises 10x with weighted ball 3000g   sit to stand from low surface  standing toe taps on cones with PT directing colors and legs, pt. Have difficulty coordinating with LLE.  Step over two consecutive half foam rollers in // bars BUE support multiple trials, cueing to lead with either foot, tendency to lead with RLE.  Side stepping over two consecutive half foam rollers in // bars. Cues for sequencing, awareness of body positioning and spacing to prevent misstep to allow for improved bathroom mobility  SPT practice, cues for making sure chair is behind back of legs, reaching back for arm rest and pulling on arm rest so legs don't push chair back.    Pt. response to medical necessity:  Patient will continue to benefit from skilled physical therapy to address deficits in strength, balance, mobility, and pain in order to improve function at home and decrease fall risk.             PT Education - 03/16/17 1433    Education provided  Yes    Education Details  stroke FAST symptoms. balance, transfers to chair.     Person(s) Educated  Patient;Spouse    Methods  Explanation;Demonstration;Verbal cues    Comprehension  Verbalized understanding;Returned demonstration       PT Short Term Goals - 03/14/17 1355      PT SHORT TERM GOAL #1   Title  Pt will perform HEP with family's supervision, for improved balance, transfers, and gait.      Baseline  02/08/17: does not perform HEP    Time  4    Period  Weeks     Status  New        PT Long Term Goals - 03/14/17 1356      PT LONG TERM GOAL #1   Title  Pt will decrease 5TSTS by at least 3 seconds in order to demonstrate clinically significant improvement in LE strength     Baseline  02/08/17: 20.1 seconds    Time  8    Period  Weeks    Status  New      PT  LONG TERM GOAL #2   Title  Pt will decrease TUG by at least 3.4s in order to demonstrate decreased fall risk     Baseline  02/08/17: 22.1s    Time  8    Period  Weeks    Status  New      PT LONG TERM GOAL #3   Title  Pt will improve BERG by at least 3 points in order to demonstrate clinically significant improvement in balance.    Baseline  02/08/17: 30/56    Time  8    Period  Weeks    Status  New      PT LONG TERM GOAL #4   Title  Pt/wife will verbalize understanding of plans for continued community fitness upon D/C from PT including aquatic therapy program    Baseline  02/08/17: pt currently reports being uncomfortable in the water    Time  8    Period  Weeks    Status  New      PT LONG TERM GOAL #5   Title  Pt will decrease mODI scoreby at least 13 points in order demonstrate clinically significant reduction in pain/disability     Baseline  02/08/17: 54%    Time  8    Period  Weeks    Status  New            Plan - 03/16/17 1619    Clinical Impression Statement  Patient is having a headache since his falls yesterday. Pt. And spouse educated on FAST for stroke symptoms due to speech difficulty. Cranial nerves and concussion symptoms tested with negative results (slightly abnormal but not abnormal from baseline per wife's report). Pt is challenged by body position in space. Patient will continue to benefit from skilled physical therapy to address deficits in strength, balance, mobility, and pain in order to improve function at home and decrease fall risk.    Rehab Potential  Fair    PT Frequency  2x / week    PT Duration  8 weeks    PT Treatment/Interventions   ADLs/Self Care Home Management;Aquatic Therapy;Cryotherapy;Electrical Stimulation;Iontophoresis 4mg /ml Dexamethasone;Moist Heat;Ultrasound;Contrast Bath;DME Instruction;Gait training;Stair training;Functional mobility training;Therapeutic activities;Therapeutic exercise;Balance training;Neuromuscular re-education;Patient/family education;Manual techniques;Wheelchair mobility training;Energy conservation    PT Next Visit Plan  progress balance, strength, aquatic therapy    PT Home Exercise Plan  None provided at this time until further assessed, wife reports pt does not perform current HEP from [facility] PT    Consulted and Agree with Plan of Care  Patient;Family member/caregiver    Family Member Consulted  wife       Patient will benefit from skilled therapeutic intervention in order to improve the following deficits and impairments:  Abnormal gait, Decreased balance, Decreased coordination, Decreased mobility, Impaired tone, Postural dysfunction  Visit Diagnosis: Other abnormalities of gait and mobility  Unsteadiness on feet  Repeated falls  Muscle weakness (generalized)     Problem List Patient Active Problem List   Diagnosis Date Noted  . Arm injury, right, initial encounter 11/15/2016  . 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 Radicular pain in right arm 10/09/2016  . Rash 09/03/2016  . GERD (gastroesophageal reflux disease) 07/31/2016  . Trochanteric bursitis 03/03/2016  . Left hand pain 10/30/2015  . Fracture, finger, multiple sites 10/30/2015  . Colon cancer screening 07/23/2015  . Healthcare maintenance 07/23/2015  . Gout 02/18/2015  . Depression 01/06/2015  . Irritation of eyelid 08/20/2014  . Dupuytren's contracture 08/20/2014  . Cough 07/21/2014  .  Lumbar stenosis with neurogenic claudication 06/13/2014  . Preop cardiovascular exam 05/28/2014  . S/P deep brain stimulator placement 05/08/2014  . Joint pain 01/22/2014  . Medicare annual wellness visit, initial 01/19/2014   . Advance care planning 01/19/2014  . Parkinson's disease ([place]) 12/13/2013  . PTSD (post-traumatic stress disorder) 06/13/2013  . Erectile dysfunction 06/13/2013  . HLD (hyperlipidemia) 06/13/2013  . Hip pain 06/10/2013  . Pain in joint, shoulder region 06/10/2013  . Right leg swelling 06/03/2013  . Essential hypertension 06/03/2013  . Bradycardia by electrocardiogram 06/03/2013  . Obstructive sleep apnea 03/12/2013  . Spinal stenosis, lumbar region, with neurogenic claudication 12/14/2012   Janna Arch, PT, DPT   Janna Arch 03/16/2017, 4:22 PM  [place] MAIN [facility] SERVICES [address] [place], [place], [address] Phone: [phone]   Fax:  [phone]  Name: [name] MRN: [id number] Date of Birth: [birth year]

## 2017-03-21 ENCOUNTER — Ambulatory Visit: Payer: Self-pay

## 2017-03-22 ENCOUNTER — Ambulatory Visit: Payer: Medicare PPO | Attending: Neurology

## 2017-03-22 DIAGNOSIS — G20 Parkinson's disease: Secondary | ICD-10-CM | POA: Insufficient documentation

## 2017-03-22 DIAGNOSIS — R262 Difficulty in walking, not elsewhere classified: Secondary | ICD-10-CM | POA: Insufficient documentation

## 2017-03-22 DIAGNOSIS — F4310 Post-traumatic stress disorder, unspecified: Secondary | ICD-10-CM | POA: Diagnosis not present

## 2017-03-22 DIAGNOSIS — M6281 Muscle weakness (generalized): Secondary | ICD-10-CM | POA: Diagnosis not present

## 2017-03-22 DIAGNOSIS — R2681 Unsteadiness on feet: Secondary | ICD-10-CM | POA: Insufficient documentation

## 2017-03-22 DIAGNOSIS — R279 Unspecified lack of coordination: Secondary | ICD-10-CM | POA: Diagnosis not present

## 2017-03-22 DIAGNOSIS — R296 Repeated falls: Secondary | ICD-10-CM | POA: Diagnosis not present

## 2017-03-22 DIAGNOSIS — R293 Abnormal posture: Secondary | ICD-10-CM | POA: Diagnosis not present

## 2017-03-22 DIAGNOSIS — R2689 Other abnormalities of gait and mobility: Secondary | ICD-10-CM | POA: Diagnosis not present

## 2017-03-22 DIAGNOSIS — R278 Other lack of coordination: Secondary | ICD-10-CM | POA: Insufficient documentation

## 2017-03-22 NOTE — Therapy (Signed)
[place] MAIN [facility] SERVICES [address] [place], [place], [address] Phone: [phone]   Fax:  [phone]  Physical Therapy Treatment  Patient Details  Name: [name] MRN: [id number] Date of Birth: [birth year] Referring Provider: Alonza Bogus, DO   Encounter Date: 03/22/2017  PT End of Session - 03/22/17 1113    Visit Number  8    Number of Visits  17    Date for PT Re-Evaluation  04/06/17    PT Start Time  1100    PT Stop Time  1145    PT Time Calculation (min)  45 min    Activity Tolerance  Patient tolerated treatment well    Behavior During Therapy  [facility] for tasks assessed/performed       Past Medical History:  Diagnosis Date  . Arthritis   . Bradycardia   . Cancer [facility]) 2013   skin cancer  . Depression    ptsd  . Dysrhythmia    chronic slow heart rate  . GERD (gastroesophageal reflux disease)   . Headache(784.0)    tension headaches non recent  . History of chicken pox   . History of kidney stones   . Hypertension    treated with HCTZ  . Parkinson's disease ([place])    dx'ed 15 years ago  . PTSD (post-traumatic stress disorder)   . Shortness of breath dyspnea   . Sleep apnea    doesn't use C-pap  . Varicose veins     Past Surgical History:  Procedure Laterality Date  . CHOLECYSTECTOMY N/A 10/22/2014   Procedure: LAPAROSCOPIC CHOLECYSTECTOMY WITH INTRAOPERATIVE CHOLANGIOGRAM;  Surgeon: Dia Crawford III, MD;  Location: [facility] ORS;  Service: General;  Laterality: N/A;  . cyst removed      from lip as a child  . LUMBAR LAMINECTOMY/DECOMPRESSION MICRODISCECTOMY Bilateral 12/14/2012   Procedure: Bilateral lumbar three-four, four-five decompressive laminotomy/foraminotomy;  Surgeon: Charlie Pitter, MD;  Location: [place] NEURO ORS;  Service: Neurosurgery;  Laterality: Bilateral;  . PULSE GENERATOR IMPLANT Bilateral 12/13/2013   Procedure: Bilateral implantable pulse generator placement;  Surgeon: Erline Levine, MD;  Location: [place]  NEURO ORS;  Service: Neurosurgery;  Laterality: Bilateral;  Bilateral implantable pulse generator placement  . skin cancer removed     from ears,   12 lft arm  rt leg 15  . SUBTHALAMIC STIMULATOR INSERTION Bilateral 12/06/2013   Procedure: SUBTHALAMIC STIMULATOR INSERTION;  Surgeon: Erline Levine, MD;  Location: [place] NEURO ORS;  Service: Neurosurgery;  Laterality: Bilateral;  Bilateral deep brain stimulator placement    There were no vitals filed for this visit.  Subjective Assessment - 03/22/17 1104    Subjective  Patient reports falling 5 times since seen last. One fall was outside without walker, one in garage with walker nearby, and a few others. Hit head a few times but no pain now, scraped back for one too. Back still hurts.     Pertinent History  Patient referred for physical therapy due to wife's request for patient to get aquatic therapy to help with his conditioning. Pt is also complaining of chronic low back and R hip pain and frequent falls. 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 The last time he was in the water he was very uncomfortable because he didn't feel like he had any control. Pt has a history of 2 low back surgeries in August 2014 and February 2016. Pt and wife are  unable to recall exactly what was performed but they know that he had stenosis. Surgeries were performed by Dr. Annette Stable in [place]. Pt reports that his low back and R hip pain are constant. He describes the pain in the back as "dull" and the pain in the hip as "sharp." For both back and hip worst pain is 10/10, Best: 2/10, Present: 2/10. No particular episode of trauma to his low back or hip preceding the pain. Pt has a history of Parkinson's with a deep brain stimulator. He ambulates with a 4 wheeled walker called a U-step (automatic brakes, audio cues for stepping). He states that he falls frequently and reports that he has had approximately 40-50 falls in the last  6 months. No specific recent changes in his health.     Currently in Pain?  Yes    Pain Score  4     Pain Location  Back    Pain Orientation  Lower;Left    Pain Descriptors / Indicators  Discomfort    Pain Type  Acute pain    Pain Onset  Yesterday          TherEx rotational seated with weighted ball taps on other side 5000 g 12x  seated UE raises 10x with weighted ball 3000g    Sit to stand from plinth table cues for one hand on seat and one hand on walker  Standing with horizontal and vertical head turns no LOB,   Airex pad: static balance 60 seconds Airex pad: 2000 Gm cross body D2 pattern 12x each side, occasional posterior LOB.   Side stepping over two consecutive half foam rollers in // bars. Cues for sequencing, awareness of body positioning and spacing to prevent misstep to allow for improved bathroom mobility  Stand behind red band and step over blue band to increase step length and back.   Step over and back half foam roller 15x each leg with BUE support. Cues for increasing step length.   Stand on half foam roller with CGA.     Pt. response to medical necessity:  Patient will continue to benefit from skilled physical therapy to address deficits in strength, balance, mobility, and pain in order to improve function at home and decrease fall risk.                     PT Education - 03/22/17 1112    Education provided  Yes    Education Details  core stability and balance, sit to stand transfers    Person(s) Educated  Patient    Methods  Explanation;Demonstration;Verbal cues    Comprehension  Verbalized understanding;Returned demonstration       PT Short Term Goals - 03/14/17 1355      PT SHORT TERM GOAL #1   Title  Pt will perform HEP with family's supervision, for improved balance, transfers, and gait.      Baseline  02/08/17: does not perform HEP    Time  4    Period  Weeks    Status  New        PT Long Term Goals - 03/14/17 1356       PT LONG TERM GOAL #1   Title  Pt will decrease 5TSTS by at least 3 seconds in order to demonstrate clinically significant improvement in LE strength     Baseline  02/08/17: 20.1 seconds    Time  8    Period  Weeks    Status  New  PT LONG TERM GOAL #2   Title  Pt will decrease TUG by at least 3.4s in order to demonstrate decreased fall risk     Baseline  02/08/17: 22.1s    Time  8    Period  Weeks    Status  New      PT LONG TERM GOAL #3   Title  Pt will improve BERG by at least 3 points in order to demonstrate clinically significant improvement in balance.    Baseline  02/08/17: 30/56    Time  8    Period  Weeks    Status  New      PT LONG TERM GOAL #4   Title  Pt/wife will verbalize understanding of plans for continued community fitness upon D/C from PT including aquatic therapy program    Baseline  02/08/17: pt currently reports being uncomfortable in the water    Time  8    Period  Weeks    Status  New      PT LONG TERM GOAL #5   Title  Pt will decrease mODI scoreby at least 13 points in order demonstrate clinically significant reduction in pain/disability     Baseline  02/08/17: 54%    Time  8    Period  Weeks    Status  New            Plan - 03/22/17 1229    Clinical Impression Statement  Patient has decreased ankle strategy when on unstable surfaces and required cueing for activation of muscles. Pt. Continues to require cueing for core activation to avoid trunk sway. Patient will continue to benefit from skilled physical therapy to address deficits in strength, balance, mobility, and pain in order to improve function at home and decrease fall risk.    Rehab Potential  Fair    PT Frequency  2x / week    PT Duration  8 weeks    PT Treatment/Interventions  ADLs/Self Care Home Management;Aquatic Therapy;Cryotherapy;Electrical Stimulation;Iontophoresis 4mg /ml Dexamethasone;Moist Heat;Ultrasound;Contrast Bath;DME Instruction;Gait training;Stair  training;Functional mobility training;Therapeutic activities;Therapeutic exercise;Balance training;Neuromuscular re-education;Patient/family education;Manual techniques;Wheelchair mobility training;Energy conservation    PT Next Visit Plan  progress balance, strength, aquatic therapy    PT Home Exercise Plan  None provided at this time until further assessed, wife reports pt does not perform current HEP from [facility] PT    Consulted and Agree with Plan of Care  Patient;Family member/caregiver    Family Member Consulted  wife       Patient will benefit from skilled therapeutic intervention in order to improve the following deficits and impairments:  Abnormal gait, Decreased balance, Decreased coordination, Decreased mobility, Impaired tone, Postural dysfunction  Visit Diagnosis: Other abnormalities of gait and mobility  Unsteadiness on feet  Repeated falls  Muscle weakness (generalized)     Problem List Patient Active Problem List   Diagnosis Date Noted  . Arm injury, right, initial encounter 11/15/2016  . 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 Radicular pain in right arm 10/09/2016  . Rash 09/03/2016  . GERD (gastroesophageal reflux disease) 07/31/2016  . Trochanteric bursitis 03/03/2016  . Left hand pain 10/30/2015  . Fracture, finger, multiple sites 10/30/2015  . Colon cancer screening 07/23/2015  . Healthcare maintenance 07/23/2015  . Gout 02/18/2015  . Depression 01/06/2015  . Irritation of eyelid 08/20/2014  . Dupuytren's contracture 08/20/2014  . Cough 07/21/2014  . Lumbar stenosis with neurogenic claudication 06/13/2014  . Preop cardiovascular exam 05/28/2014  . S/P deep brain stimulator placement 05/08/2014  .  Joint pain 01/22/2014  . Medicare annual wellness visit, initial 01/19/2014  . Advance care planning 01/19/2014  . Parkinson's disease ([place]) 12/13/2013  . PTSD (post-traumatic stress disorder) 06/13/2013  . Erectile dysfunction 06/13/2013  . HLD  (hyperlipidemia) 06/13/2013  . Hip pain 06/10/2013  . Pain in joint, shoulder region 06/10/2013  . Right leg swelling 06/03/2013  . Essential hypertension 06/03/2013  . Bradycardia by electrocardiogram 06/03/2013  . Obstructive sleep apnea 03/12/2013  . Spinal stenosis, lumbar region, with neurogenic claudication 12/14/2012   Janna Arch, PT, DPT   Janna Arch 03/22/2017, 12:31 PM  [place] MAIN [facility] SERVICES [address] [place], [place], [address] Phone: [phone]   Fax:  [phone]  Name: JAHKEEM KURKA MRN: [id number] Date of Birth: 16-Aug-1950

## 2017-03-23 ENCOUNTER — Ambulatory Visit (INDEPENDENT_AMBULATORY_CARE_PROVIDER_SITE_OTHER): Payer: Medicare PPO | Admitting: Psychology

## 2017-03-23 ENCOUNTER — Ambulatory Visit: Payer: Medicare PPO | Admitting: Physical Therapy

## 2017-03-23 ENCOUNTER — Encounter: Payer: Self-pay | Admitting: Physical Therapy

## 2017-03-23 ENCOUNTER — Ambulatory Visit: Payer: Self-pay

## 2017-03-23 DIAGNOSIS — R296 Repeated falls: Secondary | ICD-10-CM

## 2017-03-23 DIAGNOSIS — F4310 Post-traumatic stress disorder, unspecified: Secondary | ICD-10-CM | POA: Diagnosis not present

## 2017-03-23 DIAGNOSIS — M6281 Muscle weakness (generalized): Secondary | ICD-10-CM | POA: Diagnosis not present

## 2017-03-23 DIAGNOSIS — F331 Major depressive disorder, recurrent, moderate: Secondary | ICD-10-CM

## 2017-03-23 DIAGNOSIS — R2681 Unsteadiness on feet: Secondary | ICD-10-CM

## 2017-03-23 DIAGNOSIS — G20 Parkinson's disease: Secondary | ICD-10-CM | POA: Diagnosis not present

## 2017-03-23 DIAGNOSIS — R278 Other lack of coordination: Secondary | ICD-10-CM | POA: Diagnosis not present

## 2017-03-23 DIAGNOSIS — R293 Abnormal posture: Secondary | ICD-10-CM | POA: Diagnosis not present

## 2017-03-23 DIAGNOSIS — R279 Unspecified lack of coordination: Secondary | ICD-10-CM | POA: Diagnosis not present

## 2017-03-23 DIAGNOSIS — R2689 Other abnormalities of gait and mobility: Secondary | ICD-10-CM | POA: Diagnosis not present

## 2017-03-23 DIAGNOSIS — F4323 Adjustment disorder with mixed anxiety and depressed mood: Secondary | ICD-10-CM

## 2017-03-23 NOTE — Therapy (Signed)
[place] MAIN [facility] SERVICES [address] [place], [place], [address] Phone: [phone]   Fax:  [phone]  Physical Therapy Treatment  Patient Details  Name: [name] MRN: [id number] Date of Birth: 1950/12/25 Referring Provider: Alonza Bogus, DO   Encounter Date: 03/23/2017  PT End of Session - 03/23/17 1130    Visit Number  9    Number of Visits  17    Date for PT Re-Evaluation  04/06/17    PT Start Time  0900    PT Stop Time  0945    PT Time Calculation (min)  45 min    Activity Tolerance  Patient tolerated treatment well    Behavior During Therapy  [facility] for tasks assessed/performed       Past Medical History:  Diagnosis Date  . Arthritis   . Bradycardia   . Cancer [facility]) 2013   skin cancer  . Depression    ptsd  . Dysrhythmia    chronic slow heart rate  . GERD (gastroesophageal reflux disease)   . Headache(784.0)    tension headaches non recent  . History of chicken pox   . History of kidney stones   . Hypertension    treated with HCTZ  . Parkinson's disease ([place])    dx'ed 15 years ago  . PTSD (post-traumatic stress disorder)   . Shortness of breath dyspnea   . Sleep apnea    doesn't use C-pap  . Varicose veins     Past Surgical History:  Procedure Laterality Date  . CHOLECYSTECTOMY N/A 10/22/2014   Procedure: LAPAROSCOPIC CHOLECYSTECTOMY WITH INTRAOPERATIVE CHOLANGIOGRAM;  Surgeon: Dia Crawford III, MD;  Location: [facility] ORS;  Service: General;  Laterality: N/A;  . cyst removed      from lip as a child  . LUMBAR LAMINECTOMY/DECOMPRESSION MICRODISCECTOMY Bilateral 12/14/2012   Procedure: Bilateral lumbar three-four, four-five decompressive laminotomy/foraminotomy;  Surgeon: Charlie Pitter, MD;  Location: [place] NEURO ORS;  Service: Neurosurgery;  Laterality: Bilateral;  . PULSE GENERATOR IMPLANT Bilateral 12/13/2013   Procedure: Bilateral implantable pulse generator placement;  Surgeon: Erline Levine, MD;  Location: [place]  NEURO ORS;  Service: Neurosurgery;  Laterality: Bilateral;  Bilateral implantable pulse generator placement  . skin cancer removed     from ears,   12 lft arm  rt leg 15  . SUBTHALAMIC STIMULATOR INSERTION Bilateral 12/06/2013   Procedure: SUBTHALAMIC STIMULATOR INSERTION;  Surgeon: Erline Levine, MD;  Location: [place] NEURO ORS;  Service: Neurosurgery;  Laterality: Bilateral;  Bilateral deep brain stimulator placement    There were no vitals filed for this visit.  Subjective Assessment - 03/23/17 1123    Subjective  Pt reports 5 falls yesteraday.  States he is "sore" not reports no injuries.        Patient is accompained by:  Family member    Pertinent History  Patient referred for physical therapy due to wife's request for patient to get aquatic therapy to help with his conditioning. Pt is also complaining of chronic low back and R hip pain and frequent falls. 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 The last time he was in the water he was very uncomfortable because he didn't feel like he had any control. Pt has a history of 2 low back surgeries in August 2014 and February 2016. Pt and wife are unable to recall exactly what was performed but they know that he had stenosis.  Surgeries were performed by Dr. Annette Stable in [place]. Pt reports that his low back and R hip pain are constant. He describes the pain in the back as "dull" and the pain in the hip as "sharp." For both back and hip worst pain is 10/10, Best: 2/10, Present: 2/10. No particular episode of trauma to his low back or hip preceding the pain. Pt has a history of Parkinson's with a deep brain stimulator. He ambulates with a 4 wheeled walker called a U-step (automatic brakes, audio cues for stepping). He states that he falls frequently and reports that he has had approximately 40-50 falls in the last 6 months. No specific recent changes in his health.     Limitations  Walking;Standing;Sitting     How long can you sit comfortably?  30 minutes    How long can you stand comfortably?  5 minutes    Currently in Pain?  Yes    Pain Score  4     Pain Location  Back    Pain Orientation  Lower    Pain Descriptors / Indicators  Sore    Pain Type  Acute pain    Pain Onset  Other (comment)    Pain Frequency  Constant    Aggravating Factors   Pt reports general soreness from persistant falls    Multiple Pain Sites  No                              PT Education - 03/23/17 1128    Education provided  Yes    Education Details  aquatic exercises and proper techniques to asssit in stength, balance and overall stability to prevent falls and maintian functioal independance.    Person(s) Educated  Patient;Spouse    Methods  Explanation;Demonstration;Tactile cues;Verbal cues    Comprehension  Verbalized understanding;Need further instruction       PT Short Term Goals - 03/14/17 1355      PT SHORT TERM GOAL #1   Title  Pt will perform HEP with family's supervision, for improved balance, transfers, and gait.      Baseline  02/08/17: does not perform HEP    Time  4    Period  Weeks    Status  New        PT Long Term Goals - 03/14/17 1356      PT LONG TERM GOAL #1   Title  Pt will decrease 5TSTS by at least 3 seconds in order to demonstrate clinically significant improvement in LE strength     Baseline  02/08/17: 20.1 seconds    Time  8    Period  Weeks    Status  New      PT LONG TERM GOAL #2   Title  Pt will decrease TUG by at least 3.4s in order to demonstrate decreased fall risk     Baseline  02/08/17: 22.1s    Time  8    Period  Weeks    Status  New      PT LONG TERM GOAL #3   Title  Pt will improve BERG by at least 3 points in order to demonstrate clinically significant improvement in balance.    Baseline  02/08/17: 30/56    Time  8    Period  Weeks    Status  New      PT LONG TERM GOAL #4   Title  Pt/wife will verbalize understanding of  plans  for continued community fitness upon D/C from PT including aquatic therapy program    Baseline  02/08/17: pt currently reports being uncomfortable in the water    Time  8    Period  Weeks    Status  New      PT LONG TERM GOAL #5   Title  Pt will decrease mODI scoreby at least 13 points in order demonstrate clinically significant reduction in pain/disability     Baseline  02/08/17: 54%    Time  8    Period  Weeks    Status  New            Plan - 03/23/17 1131    Clinical Impression Statement  Continue with current goals.  Pt may benefit from floor transfers as he stated he has difficulty at times getting up from falls.    Clinical Decision Making  High    PT Frequency  2x / week    PT Duration  8 weeks    PT Treatment/Interventions  ADLs/Self Care Home Management;Aquatic Therapy;Cryotherapy;Electrical Stimulation;Iontophoresis 4mg /ml Dexamethasone;Moist Heat;Ultrasound;Contrast Bath;DME Instruction;Gait training;Stair training;Functional mobility training;Therapeutic activities;Therapeutic exercise;Balance training;Neuromuscular re-education;Patient/family education;Manual techniques;Wheelchair mobility training;Energy conservation    PT Next Visit Plan  progress balance, strength, aquatic therapy    Consulted and Agree with Plan of Care  Patient;Family member/caregiver    Family Member Consulted  wife       Patient will benefit from skilled therapeutic intervention in order to improve the following deficits and impairments:     Visit Diagnosis: Other abnormalities of gait and mobility  Unsteadiness on feet  Repeated falls     Problem List Patient Active Problem List   Diagnosis Date Noted  . Arm injury, right, initial encounter 11/15/2016  . 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 Radicular pain in right arm 10/09/2016  . Rash 09/03/2016  . GERD (gastroesophageal reflux disease) 07/31/2016  . Trochanteric bursitis 03/03/2016  . Left hand pain 10/30/2015  .  Fracture, finger, multiple sites 10/30/2015  . Colon cancer screening 07/23/2015  . Healthcare maintenance 07/23/2015  . Gout 02/18/2015  . Depression 01/06/2015  . Irritation of eyelid 08/20/2014  . Dupuytren's contracture 08/20/2014  . Cough 07/21/2014  . Lumbar stenosis with neurogenic claudication 06/13/2014  . Preop cardiovascular exam 05/28/2014  . S/P deep brain stimulator placement 05/08/2014  . Joint pain 01/22/2014  . Medicare annual wellness visit, initial 01/19/2014  . Advance care planning 01/19/2014  . Parkinson's disease ([place]) 12/13/2013  . PTSD (post-traumatic stress disorder) 06/13/2013  . Erectile dysfunction 06/13/2013  . HLD (hyperlipidemia) 06/13/2013  . Hip pain 06/10/2013  . Pain in joint, shoulder region 06/10/2013  . Right leg swelling 06/03/2013  . Essential hypertension 06/03/2013  . Bradycardia by electrocardiogram 06/03/2013  . Obstructive sleep apnea 03/12/2013  . Spinal stenosis, lumbar region, with neurogenic claudication 12/14/2012   Physical Therapy Aquatics note  Pt enters/exits pool via ramp Participates in the following exercises:  Ambulation, no UE support -4 L forward -4 L side  Core ex's with UE, green dumbbells, 20 x each -triceps press dowm -shld flex/ext 1 dumbbell -shld ab/add  Core ex's with UE speed for resistance, 20x each -shld flex ext -shld ab/add -shld horizontal ab/add  Bench ex for core 3 minutes each -bike -flutter -scissor  Bench squat x 20  Ball toss/catch x 6 minutes  Standing exercises at rail  X 20 -hip flex -hip ext -hip ab/add   Chesley Noon, PTA 03/23/17, 11:40 AM  [facility]  [place] MAIN [facility] SERVICES [address] [place], [place], [address] Phone: [phone]   Fax:  [phone]  Name: [name] MRN: [id number] Date of Birth: 05/12/1950

## 2017-03-23 NOTE — Patient Instructions (Signed)
Pt often does not use SPC or walker.  [name]  Reports needing to remind him to use it and he is observed ambulating frequently in pool area and in locker room without it even after reminders to do so.  Encouraged to use at all times to help prevent falls.

## 2017-03-24 NOTE — Progress Notes (Deleted)
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   Procedure: Bilateral lumbar three-four, four-five decompressive laminotomy/foraminotomy;  Surgeon: Charlie Pitter, MD;  Location: [place] NEURO ORS;  Service: Neurosurgery;  Laterality: Bilateral;  . PULSE GENERATOR IMPLANT Bilateral 12/13/2013   Procedure: Bilateral implantable pulse generator placement;  Surgeon: Erline Levine, MD;  Location: [place] NEURO ORS;  Service: Neurosurgery;  Laterality: Bilateral;  Bilateral implantable pulse generator placement  . skin cancer removed     from ears,   12 lft arm  rt leg 15  . SUBTHALAMIC STIMULATOR INSERTION Bilateral 12/06/2013   Procedure: SUBTHALAMIC STIMULATOR INSERTION;  Surgeon: Erline Levine, MD;  Location: [place] NEURO ORS;  Service: Neurosurgery;  Laterality: Bilateral;  Bilateral deep brain stimulator placement    SOCIAL HISTORY:   Social History   Socioeconomic History  . Marital status: Married    Spouse name: Not on file  . Number of children: Not on file  . Years of education: Not on file  . Highest education level: Not on file  Social Needs  . Financial resource strain: Not on file  . Food insecurity - worry: Not on file  . Food insecurity - inability: Not on file  . Transportation needs - medical: Not on file  . Transportation needs - non-medical: Not on file  Occupational History  . Occupation: disabled    Comment: 2001, [place]    Comment:  search and rescue helicopter  Tobacco Use  . Smoking status: Never Smoker  . Smokeless tobacco: Former Network engineer and Sexual Activity  . Alcohol use: Yes    Alcohol/week: 0.0 oz    Comment: occasional (twice per month)  . Drug use: No  . Sexual activity: No  Other Topics Concern  . Not on file  Social History Narrative   Previous [employer], CW-04   H/o PTSD.  Prev search and rescue work   3 kids from prev relationship.    Married 2014    FAMILY HISTORY:   Family Status  Relation Name Status  . Mother  Alive       Arthritis, unknown medical hx  . Father  Deceased       Lung Cancer  . Sister  Alive       1 and one half sister  . Brother  Alive  . Son  Alive  . Daughter  Alive       2  . Neg Hx  (Not Specified)    ROS:  A complete 10 system review of systems was obtained and was unremarkable apart from what is mentioned above.  PHYSICAL EXAMINATION:    VITALS:   There were no vitals filed for this visit. Wt Readings from Last 3 Encounters:  02/22/17 180 lb (81.6 kg)  12/28/16 180 lb (81.6 kg)  11/02/16 179 lb 9.6 oz (81.5 kg)     GEN:  The patient appears stated age and is in NAD.   More jovial and interactive HEENT:  Normocephalic, atraumatic.  The mucous membranes are moist. The superficial temporal arteries are without ropiness or tenderness. Cardiovascular: bradycardic.  Regular rhythym. Lungs: Clear to auscultation bilaterally   Neurological examination:  Orientation: The patient is alert and oriented x3.  He is awake  Cranial nerves: There is good facial symmetry.  The speech is fluent but dysarthric but better than last several visits.    Soft palate rises symmetrically and there is no tongue deviation. Hearing is intact to conversational tone. Sensation: Sensation is intact to light touch throughout. Motor: Strength is 5/5 in the bilateral upper and lower extremities.   Shoulder shrug is equal and symmetric.  There is no pronator drift. Deep tendon  reflexes: Deferred today.  Movement examination: Tone: There is normal tone today Abnormal movements: There is rare tremor when he holds the L arm above the head.  There is no dyskinesia today. Coordination: There is good RAMs today Gait and Station: deferred.  In wheelchair  DBS programming was performed today which is described in more detail on a separate programming procedural notes.   ASSESSMENT/PLAN:  1.  Parkinsons disease by hx.   -The patient is status post bilateral STN DBS on 12/06/2013 with battery placement on 12/13/2013.  -off of mirapex and selegeline  -continue carbidopa/levodopa 25/100, 2/1/1.     -talked about electric/motorized wheelchair which patient would like to try.  I talked to patient and wife about mental status and I do think that he would be able to operate this  -***Talked with patient and wife about the fact that I really do not think he should be ambulating any longer.  He has multiple falls per day.  He is going to have a serious injury but that he may not be able to recover from. 2.  LBP.   -Status post repeat L4-L5 laminotomy  with foraminotomy and L1-L2 laminectomy on 06/13/2014.   3.  Dysphagia  - He did have a modified barium swallow on 08/07/2015.  It demonstrated mild oral pharyngeal dysphagia (an improvement from 2015 examination) and a regular diet with thin liquids was recommended.  4.  OSAS with excessive daytime hypersomnolence.  -***He had a nocturnal polysomnogram in November, 2018 demonstrating severe sleep apnea.  CPAP was recommended.  The patient ***. 5.  Depression  -on Wellbutrin XL, 450 mg and lexapro and doing better.  Is doing better in this regard  -felt near suicidal on klonopin 6.  Early Hallucinations  -decided to hold on further meds right now.  Most hallucination inanimate objects that he knows are not real  -Took Nuplazid but felt didn't help.  He was on it for about 7 weeks before it was discontinued. 7.  RBD  -Still acting  out the dreams some, but felt almost near suicidal on clonazepam.  Bedroom safety discussed.  Seeing Dr. Annamaria Boots 8. Bradycardia  -cardiology following.  Cardiology doesn't think that this is etiology for fatigue 9.  Parkinsons disease dementia  -***He is now on Namenda, 10 mg twice per day. 10.  Follow-up in the next 5-6 months, sooner should new neurologic issues arise.  Much greater than 50% of this visit was spent in counseling and coordinating care.  Total face to face time:  35 min

## 2017-03-27 ENCOUNTER — Ambulatory Visit: Payer: Medicare PPO

## 2017-03-28 ENCOUNTER — Ambulatory Visit: Payer: Self-pay | Admitting: Neurology

## 2017-03-30 ENCOUNTER — Ambulatory Visit: Payer: Medicare PPO

## 2017-04-04 ENCOUNTER — Ambulatory Visit: Payer: Medicare PPO

## 2017-04-04 DIAGNOSIS — R2689 Other abnormalities of gait and mobility: Secondary | ICD-10-CM

## 2017-04-04 DIAGNOSIS — R2681 Unsteadiness on feet: Secondary | ICD-10-CM | POA: Diagnosis not present

## 2017-04-04 DIAGNOSIS — R296 Repeated falls: Secondary | ICD-10-CM

## 2017-04-04 DIAGNOSIS — M6281 Muscle weakness (generalized): Secondary | ICD-10-CM | POA: Diagnosis not present

## 2017-04-04 DIAGNOSIS — G20 Parkinson's disease: Secondary | ICD-10-CM | POA: Diagnosis not present

## 2017-04-04 DIAGNOSIS — R293 Abnormal posture: Secondary | ICD-10-CM | POA: Diagnosis not present

## 2017-04-04 DIAGNOSIS — R279 Unspecified lack of coordination: Secondary | ICD-10-CM | POA: Diagnosis not present

## 2017-04-04 DIAGNOSIS — R278 Other lack of coordination: Secondary | ICD-10-CM | POA: Diagnosis not present

## 2017-04-04 DIAGNOSIS — F4310 Post-traumatic stress disorder, unspecified: Secondary | ICD-10-CM | POA: Diagnosis not present

## 2017-04-04 NOTE — Therapy (Signed)
[place] MAIN [facility] SERVICES [address] [place], [place], [address] Phone: [phone]   Fax:  [phone]  Physical Therapy Treatment  Patient Details  Name: [name] MRN: [id number] Date of Birth: 06/25/1950 Referring Provider: Alonza Bogus, DO   Encounter Date: 04/04/2017  PT End of Session - 04/04/17 1220    Visit Number  10    Number of Visits  33    Date for PT Re-Evaluation  05/30/17    PT Start Time  0930    PT Stop Time  1015    PT Time Calculation (min)  45 min    Activity Tolerance  Patient tolerated treatment well    Behavior During Therapy  [facility] for tasks assessed/performed       Past Medical History:  Diagnosis Date  . Arthritis   . Bradycardia   . Cancer [facility]) 2013   skin cancer  . Depression    ptsd  . Dysrhythmia    chronic slow heart rate  . GERD (gastroesophageal reflux disease)   . Headache(784.0)    tension headaches non recent  . History of chicken pox   . History of kidney stones   . Hypertension    treated with HCTZ  . Parkinson's disease ([place])    dx'ed 15 years ago  . PTSD (post-traumatic stress disorder)   . Shortness of breath dyspnea   . Sleep apnea    doesn't use C-pap  . Varicose veins     Past Surgical History:  Procedure Laterality Date  . CHOLECYSTECTOMY N/A 10/22/2014   Procedure: LAPAROSCOPIC CHOLECYSTECTOMY WITH INTRAOPERATIVE CHOLANGIOGRAM;  Surgeon: Dia Crawford III, MD;  Location: [facility] ORS;  Service: General;  Laterality: N/A;  . cyst removed      from lip as a child  . LUMBAR LAMINECTOMY/DECOMPRESSION MICRODISCECTOMY Bilateral 12/14/2012   Procedure: Bilateral lumbar three-four, four-five decompressive laminotomy/foraminotomy;  Surgeon: Charlie Pitter, MD;  Location: [place] NEURO ORS;  Service: Neurosurgery;  Laterality: Bilateral;  . PULSE GENERATOR IMPLANT Bilateral 12/13/2013   Procedure: Bilateral implantable pulse generator placement;  Surgeon: Erline Levine, MD;  Location: [place]  NEURO ORS;  Service: Neurosurgery;  Laterality: Bilateral;  Bilateral implantable pulse generator placement  . skin cancer removed     from ears,   12 lft arm  rt leg 15  . SUBTHALAMIC STIMULATOR INSERTION Bilateral 12/06/2013   Procedure: SUBTHALAMIC STIMULATOR INSERTION;  Surgeon: Erline Levine, MD;  Location: [place] NEURO ORS;  Service: Neurosurgery;  Laterality: Bilateral;  Bilateral deep brain stimulator placement    There were no vitals filed for this visit.  Subjective Assessment - 04/04/17 0933    Subjective  Patient reports 5 falls since last monday, most of which involve hitting the L ribs repeatedly which is now sore but not painful to inhalation.     Patient is accompained by:  Family member    Pertinent History  Patient referred for physical therapy due to wife's request for patient to get aquatic therapy to help with his conditioning. Pt is also complaining of chronic low back and R hip pain and frequent falls. 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 The last time he was in the water he was very uncomfortable because he didn't feel like he had any control. Pt has a history of 2 low back surgeries in August 2014 and February 2016. Pt and wife are unable to recall exactly what was  performed but they know that he had stenosis. Surgeries were performed by Dr. Annette Stable in [place]. Pt reports that his low back and R hip pain are constant. He describes the pain in the back as "dull" and the pain in the hip as "sharp." For both back and hip worst pain is 10/10, Best: 2/10, Present: 2/10. No particular episode of trauma to his low back or hip preceding the pain. Pt has a history of Parkinson's with a deep brain stimulator. He ambulates with a 4 wheeled walker called a U-step (automatic brakes, audio cues for stepping). He states that he falls frequently and reports that he has had approximately 40-50 falls in the last 6 months. No specific recent  changes in his health.     Limitations  Walking;Standing;Sitting    How long can you sit comfortably?  30 minutes    How long can you stand comfortably?  5 minutes    Currently in Pain?  Yes    Pain Score  6     Pain Location  Rib cage    Pain Orientation  Left    Pain Descriptors / Indicators  Grimacing;Jabbing;Tender    Pain Type  Acute pain    Pain Onset  In the past 7 days    Pain Frequency  Constant    Aggravating Factors   moving, touching it    Pain Relieving Factors  nothing really per patient report        RECERT -also does aquatics 5x STS: 17 seconds TUG: 20 seconds BERG: 38/ 56  MODI=46%  Reaching inside and outside of BOS to reach for sticky notes with letters on it that PT reads out. Occasional posterior LOB when returning to [facility] with good correction.   Airex pad: static balance 40 seconds, eyes closed 60 seconds more frequent trunk sway requiring excessive ankle stability, eyes open counting down from 50 by 5 and by 3 more frequent LOB due to distraction.   6" step toe taps two finger support bilaterally, ocassional misstep with LLE lifting step up in the air x 20   6" step side toe taps with BUE support 15x each leg.  [facility] PT Assessment - 04/04/17 0001      Berg Balance Test   Sit to Stand  Able to stand  independently using hands    Standing Unsupported  Able to stand safely 2 minutes    Sitting with Back Unsupported but Feet Supported on Floor or Stool  Able to sit safely and securely 2 minutes    Stand to Sit  Uses backs of legs against chair to control descent    Transfers  Able to transfer with verbal cueing and /or supervision    Standing Unsupported with Eyes Closed  Able to stand 10 seconds with supervision    Standing Ubsupported with Feet Together  Able to place feet together independently and stand 1 minute safely    From Standing, Reach Forward with Outstretched Arm  Can reach forward >12 cm safely (5")    From Standing Position, Pick up Object from  Floor  Able to pick up shoe, needs supervision    From Standing Position, Turn to Look Behind Over each Shoulder  Looks behind from both sides and weight shifts well    Turn 360 Degrees  Able to turn 360 degrees safely but slowly    Standing Unsupported, Alternately Place Feet on Step/Stool  Able to complete >2 steps/needs minimal assist    Standing Unsupported, One  Foot in [place] to take small step independently and hold 30 seconds    Standing on One Leg  Tries to lift leg/unable to hold 3 seconds but remains standing independently    Total Score  38                          PT Education - 04/04/17 1219    Education provided  Yes    Education Details  POC, posture and body awareness in space    Person(s) Educated  Patient    Methods  Explanation;Demonstration;Verbal cues    Comprehension  Verbalized understanding;Returned demonstration       PT Short Term Goals - 04/04/17 1227      PT SHORT TERM GOAL #1   Title  Pt will perform HEP with family's supervision, for improved balance, transfers, and gait.      Baseline  becoming more compliant but not every day     Time  4    Period  Weeks    Status  Partially Met      PT SHORT TERM GOAL #2   Title  Pt will perform 5x sit<>stand in less than or equal to 15 seconds for improved efficiency and safety with transfers.    Baseline  17 seconds    Time  2    Period  Weeks    Status  New    Target Date  04/04/17        PT Long Term Goals - 04/04/17 0946      PT LONG TERM GOAL #1   Title  Pt will decrease 5TSTS by at least 3 seconds in order to demonstrate clinically significant improvement in LE strength     Baseline  02/08/17: 20.1 seconds 12/18: 17 seconds    Time  8    Period  Weeks    Status  Achieved      PT LONG TERM GOAL #2   Title  Pt will decrease TUG by at least 3.4s in order to demonstrate decreased fall risk     Baseline  02/08/17: 22.1s 12/18: 20 seconds    Time  8    Period  Weeks     Status  Partially Met      PT LONG TERM GOAL #3   Title  Pt will improve BERG by at least 3 points in order to demonstrate clinically significant improvement in balance.    Baseline  02/08/17: 30/56 12/18: 38/56     Time  8    Period  Weeks    Status  Achieved      PT LONG TERM GOAL #4   Title  Pt/wife will verbalize understanding of plans for continued community fitness upon D/C from PT including aquatic therapy program    Baseline  02/08/17: pt currently reports being more comfortable in the water    Time  8    Period  Weeks    Status  Partially Met      PT LONG TERM GOAL #5   Title  Pt will decrease mODI scoreby at least 13 points in order demonstrate clinically significant reduction in pain/disability     Baseline  02/08/17: 54% 12/18: 46%    Time  8    Period  Weeks    Status  Partially Met      Additional Long Term Goals   Additional Long Term Goals  Yes      PT LONG  TERM GOAL #6   Title  Pt will improve BERG to 44/56  in order to demonstrate clinically significant improvement in balance for decreased fall risk    Baseline  04/20/23: 38/56    Time  8    Period  Weeks    Status  New    Target Date  05/30/17            Plan - 2017/04/19 1225    Clinical Impression Statement  Patient progressing towards goals at this time 5x STS=17 seconds, TUG=20 seconds, BERG 38/56. Patient progressing with aquatic based therapy and land based therapy. Patient unaware of posterior lean until starts to lose balance. Will work on postural education and awareness of body positioning. Patient balance challenged by distractions. Patient will continue to benefit from skilled physical therapy to address deficits in strength, balance, mobility, and pain in order to improve function at home and decrease fall risk.     History and Personal Factors relevant to plan of care:  3 or more personal factors/comorbidities, 4 or more body systems/activity limiations/participation restrictions    Clinical  Presentation  Unstable    Clinical Presentation due to:  progressive neurodegenerative disorder, recurrent falls, highly variable low back and hip pain    Clinical Decision Making  High    Rehab Potential  Fair    PT Frequency  2x / week    PT Duration  8 weeks    PT Treatment/Interventions  ADLs/Self Care Home Management;Aquatic Therapy;Cryotherapy;Electrical Stimulation;Iontophoresis '4mg'$ /ml Dexamethasone;Moist Heat;Ultrasound;Contrast Bath;DME Instruction;Gait training;Stair training;Functional mobility training;Therapeutic activities;Therapeutic exercise;Balance training;Neuromuscular re-education;Patient/family education;Manual techniques;Wheelchair mobility training;Energy conservation;Passive range of motion    PT Next Visit Plan  progress balance, strength, aquatic therapy    PT Home Exercise Plan  None provided at this time until further assessed, wife reports pt does not perform current HEP from [facility] PT    Consulted and Agree with Plan of Care  Patient;Family member/caregiver    Family Member Consulted  wife       Patient will benefit from skilled therapeutic intervention in order to improve the following deficits and impairments:  Abnormal gait, Decreased balance, Decreased coordination, Decreased mobility, Impaired tone, Postural dysfunction  Visit Diagnosis: Other abnormalities of gait and mobility  Unsteadiness on feet  Repeated falls   G-Codes - 04/19/17 1231    Functional Assessment Tool Used (Outpatient Only)  37mgait speed, TUG, BERG, 5TSTS, clinical judgement, VAS    Functional Limitation  Mobility: Walking and moving around    Mobility: Walking and Moving Around Current Status [phone]  At least 40 percent but less than 60 percent impaired, limited or restricted    Mobility: Walking and Moving Around Goal Status [phone]  At least 40 percent but less than 60 percent impaired, limited or restricted       Problem List Patient Active Problem List   Diagnosis Date Noted   . Arm injury, right, initial encounter 11/15/2016  . 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 Radicular pain in right arm 10/09/2016  . Rash 09/03/2016  . GERD (gastroesophageal reflux disease) 07/31/2016  . Trochanteric bursitis 03/03/2016  . Left hand pain 10/30/2015  . Fracture, finger, multiple sites 10/30/2015  . Colon cancer screening 07/23/2015  . Healthcare maintenance 07/23/2015  . Gout 02/18/2015  . Depression 01/06/2015  . Irritation of eyelid 08/20/2014  . Dupuytren's contracture 08/20/2014  . Cough 07/21/2014  . Lumbar stenosis with neurogenic claudication 06/13/2014  . Preop cardiovascular exam 05/28/2014  . S/P deep brain stimulator placement  05/08/2014  . Joint pain 01/22/2014  . Medicare annual wellness visit, initial 01/19/2014  . Advance care planning 01/19/2014  . Parkinson's disease ([place]) 12/13/2013  . PTSD (post-traumatic stress disorder) 06/13/2013  . Erectile dysfunction 06/13/2013  . HLD (hyperlipidemia) 06/13/2013  . Hip pain 06/10/2013  . Pain in joint, shoulder region 06/10/2013  . Right leg swelling 06/03/2013  . Essential hypertension 06/03/2013  . Bradycardia by electrocardiogram 06/03/2013  . Obstructive sleep apnea 03/12/2013  . Spinal stenosis, lumbar region, with neurogenic claudication 12/14/2012   Janna Arch, PT, DPT   Janna Arch 04/04/2017, 12:32 PM  [place] MAIN [facility] SERVICES [address] [place], [place], [address] Phone: [phone]   Fax:  [phone]  Name: [name] MRN: [id number] Date of Birth: 1950-12-28

## 2017-04-06 ENCOUNTER — Ambulatory Visit (INDEPENDENT_AMBULATORY_CARE_PROVIDER_SITE_OTHER): Payer: Medicare PPO | Admitting: Psychology

## 2017-04-06 ENCOUNTER — Ambulatory Visit: Payer: Medicare PPO

## 2017-04-06 DIAGNOSIS — R278 Other lack of coordination: Secondary | ICD-10-CM

## 2017-04-06 DIAGNOSIS — F331 Major depressive disorder, recurrent, moderate: Secondary | ICD-10-CM | POA: Diagnosis not present

## 2017-04-06 DIAGNOSIS — R2681 Unsteadiness on feet: Secondary | ICD-10-CM | POA: Diagnosis not present

## 2017-04-06 DIAGNOSIS — R296 Repeated falls: Secondary | ICD-10-CM

## 2017-04-06 DIAGNOSIS — M6281 Muscle weakness (generalized): Secondary | ICD-10-CM

## 2017-04-06 DIAGNOSIS — G20 Parkinson's disease: Secondary | ICD-10-CM | POA: Diagnosis not present

## 2017-04-06 DIAGNOSIS — R2689 Other abnormalities of gait and mobility: Secondary | ICD-10-CM | POA: Diagnosis not present

## 2017-04-06 DIAGNOSIS — R293 Abnormal posture: Secondary | ICD-10-CM | POA: Diagnosis not present

## 2017-04-06 DIAGNOSIS — F4323 Adjustment disorder with mixed anxiety and depressed mood: Secondary | ICD-10-CM

## 2017-04-06 DIAGNOSIS — R279 Unspecified lack of coordination: Secondary | ICD-10-CM | POA: Diagnosis not present

## 2017-04-06 DIAGNOSIS — F4310 Post-traumatic stress disorder, unspecified: Secondary | ICD-10-CM | POA: Diagnosis not present

## 2017-04-06 DIAGNOSIS — R262 Difficulty in walking, not elsewhere classified: Secondary | ICD-10-CM

## 2017-04-06 NOTE — Therapy (Signed)
[place] MAIN [facility] SERVICES [address] [place], [place], [address] Phone: [phone]   Fax:  [phone]  Physical Therapy Treatment  Patient Details  Name: [name] MRN: [id number] Date of Birth: May 27, 1950 Referring Provider: Alonza Bogus, DO   Encounter Date: 04/06/2017  PT End of Session - 04/06/17 1436    Visit Number  11    Number of Visits  33    Date for PT Re-Evaluation  05/30/17    PT Start Time  1115    PT Stop Time  1215    PT Time Calculation (min)  60 min       Past Medical History:  Diagnosis Date  . Arthritis   . Bradycardia   . Cancer [facility]) 2013   skin cancer  . Depression    ptsd  . Dysrhythmia    chronic slow heart rate  . GERD (gastroesophageal reflux disease)   . Headache(784.0)    tension headaches non recent  . History of chicken pox   . History of kidney stones   . Hypertension    treated with HCTZ  . Parkinson's disease ([place])    dx'ed 15 years ago  . PTSD (post-traumatic stress disorder)   . Shortness of breath dyspnea   . Sleep apnea    doesn't use C-pap  . Varicose veins     Past Surgical History:  Procedure Laterality Date  . CHOLECYSTECTOMY N/A 10/22/2014   Procedure: LAPAROSCOPIC CHOLECYSTECTOMY WITH INTRAOPERATIVE CHOLANGIOGRAM;  Surgeon: Dia Crawford III, MD;  Location: [facility] ORS;  Service: General;  Laterality: N/A;  . cyst removed      from lip as a child  . LUMBAR LAMINECTOMY/DECOMPRESSION MICRODISCECTOMY Bilateral 12/14/2012   Procedure: Bilateral lumbar three-four, four-five decompressive laminotomy/foraminotomy;  Surgeon: Charlie Pitter, MD;  Location: [place] [facility]URO ORS;  Service: Neurosurgery;  Laterality: Bilateral;  . PULSE GENERATOR IMPLANT Bilateral 12/13/2013   Procedure: Bilateral implantable pulse generator placement;  Surgeon: Erline Levine, MD;  Location: [place] NEURO ORS;  Service: Neurosurgery;  Laterality: Bilateral;  Bilateral implantable pulse generator placement  . skin  cancer removed     from ears,   12 lft arm  rt leg 15  . SUBTHALAMIC STIMULATOR INSERTION Bilateral 12/06/2013   Procedure: SUBTHALAMIC STIMULATOR INSERTION;  Surgeon: Erline Levine, MD;  Location: [place] NEURO ORS;  Service: Neurosurgery;  Laterality: Bilateral;  Bilateral deep brain stimulator placement    There were no vitals filed for this visit.  Subjective Assessment - 04/06/17 1432    Subjective  Pt notes L rib cage pain due to multiple falls. Pt does note some pain with deep inhalation/not cough. No bruising noted. Pt also able to tolerate overhead stretch in supine.     Pertinent History  Patient referred for physical therapy due to wife's request for patient to get aquatic therapy to help with his conditioning. Pt is also complaining of chronic low back and R hip pain and frequent falls. 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 The last time he was in the water he was very uncomfortable because he didn't feel like he had any control. Pt has a history of 2 low back surgeries in August 2014 and February 2016. Pt and wife are unable to recall exactly what was performed but they know that he had stenosis. Surgeries were performed by Dr. Annette Stable in [place]. Pt reports that his low back and R hip  pain are constant. He describes the pain in the back as "dull" and the pain in the hip as "sharp." For both back and hip worst pain is 10/10, Best: 2/10, Present: 2/10. No particular episode of trauma to his low back or hip preceding the pain. Pt has a history of Parkinson's with a deep brain stimulator. He ambulates with a 4 wheeled walker called a U-step (automatic brakes, audio cues for stepping). He states that he falls frequently and reports that he has had approximately 40-50 falls in the last 6 months. No specific recent changes in his health.      Pt enters/exits pool via ramp with Min A Participates in the following  Ambulation with kick boards  for UE support and Min to Mod A - 4 L fwd - 4 L side with pause/balance gain post each abd and add step  Bench seated, 2 min ea - bike - scissor - flutter   Core stabilization with UE strengthening - triceps press down, green dumbbell - sh flex/ext - sh abd/add - sh horiz abd/add  STS at bench, no UE support with gained balance at full upright and control descent to seated position  Balance - high knee march, no UE support Min A as needed  Supine float with passive lateral sway for relaxation and possible attempted backstroke arms. Pt unable to fully relax due to tone.                         PT Education - 04/06/17 1434    Education provided  Yes    Education Details  Suggested stretching through anterior and L sidebody in seated or supine position due to ribcage pain. Also suggested forceful cough after inhalation for possible self mobilizing ribs if slightly dislocated. If persistent or worsening pain suggested visit with MD    Person(s) Educated  Spouse;Patient       PT Short Term Goals - 04/06/17 1438      PT SHORT TERM GOAL #1   Title  Pt will perform HEP with family's supervision, for improved balance, transfers, and gait.      Baseline  becoming more compliant but not every day     Time  4    Period  Weeks    Status  Partially Met      PT SHORT TERM GOAL #2   Title  Pt will perform 5x sit<>stand in less than or equal to 15 seconds for improved efficiency and safety with transfers.    Baseline  17 seconds    Time  2    Period  Weeks    Status  New        PT Long Term Goals - 04/06/17 1439      PT LONG TERM GOAL #1   Title  Pt will decrease 5TSTS by at least 3 seconds in order to demonstrate clinically significant improvement in LE strength     Baseline  02/08/17: 20.1 seconds 12/18: 17 seconds    Time  8    Period  Weeks    Status  Achieved      PT LONG TERM GOAL #2   Title  Pt will decrease TUG by at least 3.4s in order to  demonstrate decreased fall risk     Baseline  02/08/17: 22.1s 12/18: 20 seconds    Time  8    Period  Weeks    Status  Partially Met  PT LONG TERM GOAL #3   Title  Pt will improve BERG by at least 3 points in order to demonstrate clinically significant improvement in balance.    Baseline  02/08/17: 30/56 12/18: 38/56     Time  8    Period  Weeks    Status  Achieved      PT LONG TERM GOAL #4   Title  Pt/wife will verbalize understanding of plans for continued community fitness upon D/C from PT including aquatic therapy program    Baseline  02/08/17: pt currently reports being more comfortable in the water    Time  8    Period  Weeks    Status  Partially Met      PT LONG TERM GOAL #5   Title  Pt will decrease mODI scoreby at least 13 points in order demonstrate clinically significant reduction in pain/disability     Baseline  02/08/17: 54% 12/18: 46%    Time  8    Period  Weeks    Status  Partially Met      PT LONG TERM GOAL #6   Title  Pt will improve BERG to 44/56  in order to demonstrate clinically significant improvement in balance for decreased fall risk    Baseline  12/18: 38/56    Time  8    Period  Weeks    Status  New            Plan - 04/06/17 1437    Clinical Impression Statement  Pt continues to need verbal cues for slowed and more deliberate steps/movement in the water for purpose of working on balance/control. Requires Min to Mod A to maintain or gain balance in stand position.     Rehab Potential  Fair    PT Frequency  2x / week    PT Duration  8 weeks    PT Treatment/Interventions  ADLs/Self Care Home Management;Aquatic Therapy;Cryotherapy;Electrical Stimulation;Iontophoresis 32m/ml Dexamethasone;Moist Heat;Ultrasound;Contrast Bath;DME Instruction;Gait training;Stair training;Functional mobility training;Therapeutic activities;Therapeutic exercise;Balance training;Neuromuscular re-education;Patient/family education;Manual techniques;Wheelchair mobility  training;Energy conservation;Passive range of motion    PT Next Visit Plan  progress balance, strength, aquatic therapy    PT Home Exercise Plan  None provided at this time until further assessed, wife reports pt [facility]    Consulted and Agree with Plan of Care  Patient;Family member/caregiver    Family Member Consulted  wife       Patient will benefit from skilled therapeutic intervention in order to improve the following deficits and impairments:  Abnormal gait, Decreased balance, Decreased coordination, Decreased mobility, Impaired tone, Postural dysfunction  Visit Diagnosis: Other abnormalities of gait and mobility  Unsteadiness on feet  Repeated falls  Muscle weakness (generalized)  Difficulty walking  Abnormal posture  Other lack of coordination     Problem List Patient Active Problem List   Diagnosis Date Noted  . Arm injury, right, initial encounter 11/15/2016  . 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 Radicular pain in right arm 10/09/2016  . Rash 09/03/2016  . GERD (gastroesophageal reflux disease) 07/31/2016  . Trochanteric bursitis 03/03/2016  . Left hand pain 10/30/2015  . Fracture, finger, multiple sites 10/30/2015  . Colon cancer screening 07/23/2015  . Healthcare maintenance 07/23/2015  . Gout 02/18/2015  . Depression 01/06/2015  . Irritation of eyelid 08/20/2014  . Dupuytren's contracture 08/20/2014  . Cough 07/21/2014  . Lumbar stenosis with neurogenic claudication 06/13/2014  . Preop cardiovascular exam 05/28/2014  . S/P deep  brain stimulator placement 05/08/2014  . Joint pain 01/22/2014  . Medicare annual wellness visit, initial 01/19/2014  . Advance care planning 01/19/2014  . Parkinson's disease ([place]) 12/13/2013  . PTSD (post-traumatic stress disorder) 06/13/2013  . Erectile dysfunction 06/13/2013  . HLD (hyperlipidemia) 06/13/2013  . Hip pain 06/10/2013  . Pain in joint, shoulder region 06/10/2013  . Right  leg swelling 06/03/2013  . Essential hypertension 06/03/2013  . Bradycardia by electrocardiogram 06/03/2013  . Obstructive sleep apnea 03/12/2013  . Spinal stenosis, lumbar region, with neurogenic claudication 12/14/2012    Larae Grooms 04/06/2017, 2:40 PM  [place], [place], [address] Phone: [phone]   Fax:  [phone]  Name: [name] MRN: [id number] Date of Birth: 1950-05-15

## 2017-04-10 ENCOUNTER — Ambulatory Visit: Payer: Medicare PPO

## 2017-04-12 ENCOUNTER — Encounter: Payer: Self-pay | Admitting: Neurology

## 2017-04-13 ENCOUNTER — Ambulatory Visit: Payer: Medicare PPO

## 2017-04-13 DIAGNOSIS — R262 Difficulty in walking, not elsewhere classified: Secondary | ICD-10-CM

## 2017-04-13 DIAGNOSIS — R293 Abnormal posture: Secondary | ICD-10-CM

## 2017-04-13 DIAGNOSIS — G20 Parkinson's disease: Secondary | ICD-10-CM | POA: Diagnosis not present

## 2017-04-13 DIAGNOSIS — M6281 Muscle weakness (generalized): Secondary | ICD-10-CM

## 2017-04-13 DIAGNOSIS — R296 Repeated falls: Secondary | ICD-10-CM | POA: Diagnosis not present

## 2017-04-13 DIAGNOSIS — F4310 Post-traumatic stress disorder, unspecified: Secondary | ICD-10-CM | POA: Diagnosis not present

## 2017-04-13 DIAGNOSIS — R279 Unspecified lack of coordination: Secondary | ICD-10-CM

## 2017-04-13 DIAGNOSIS — R2681 Unsteadiness on feet: Secondary | ICD-10-CM

## 2017-04-13 DIAGNOSIS — R278 Other lack of coordination: Secondary | ICD-10-CM

## 2017-04-13 DIAGNOSIS — R2689 Other abnormalities of gait and mobility: Secondary | ICD-10-CM | POA: Diagnosis not present

## 2017-04-13 NOTE — Therapy (Signed)
[place] MAIN [facility] SERVICES [address] [place], [place], [address] Phone: [phone]   Fax:  [phone]  Physical Therapy Treatment  Patient Details  Name: [name] MRN: [id number] Date of Birth: September 16, 1950 Referring Provider: Alonza Bogus, DO   Encounter Date: 04/13/2017  PT End of Session - 04/13/17 1340    Visit Number  12    Number of Visits  33    Date for PT Re-Evaluation  05/30/17    PT Start Time  1110    PT Stop Time  1200    PT Time Calculation (min)  50 min       Past Medical History:  Diagnosis Date  . Arthritis   . Bradycardia   . Cancer [facility]) 2013   skin cancer  . Depression    ptsd  . Dysrhythmia    chronic slow heart rate  . GERD (gastroesophageal reflux disease)   . Headache(784.0)    tension headaches non recent  . History of chicken pox   . History of kidney stones   . Hypertension    treated with HCTZ  . Parkinson's disease ([place])    dx'ed 15 years ago  . PTSD (post-traumatic stress disorder)   . Shortness of breath dyspnea   . Sleep apnea    doesn't use C-pap  . Varicose veins     Past Surgical History:  Procedure Laterality Date  . CHOLECYSTECTOMY N/A 10/22/2014   Procedure: LAPAROSCOPIC CHOLECYSTECTOMY WITH INTRAOPERATIVE CHOLANGIOGRAM;  Surgeon: Dia Crawford III, MD;  Location: [facility] ORS;  Service: General;  Laterality: N/A;  . cyst removed      from lip as a child  . LUMBAR LAMINECTOMY/DECOMPRESSION MICRODISCECTOMY Bilateral 12/14/2012   Procedure: Bilateral lumbar three-four, four-five decompressive laminotomy/foraminotomy;  Surgeon: Charlie Pitter, MD;  Location: [place] NEURO ORS;  Service: Neurosurgery;  Laterality: Bilateral;  . PULSE GENERATOR IMPLANT Bilateral 12/13/2013   Procedure: Bilateral implantable pulse generator placement;  Surgeon: Erline Levine, MD;  Location: [place] NEURO ORS;  Service: Neurosurgery;  Laterality: Bilateral;  Bilateral implantable pulse generator placement  . skin  cancer removed     from ears,   12 lft arm  rt leg 15  . SUBTHALAMIC STIMULATOR INSERTION Bilateral 12/06/2013   Procedure: SUBTHALAMIC STIMULATOR INSERTION;  Surgeon: Erline Levine, MD;  Location: [place] NEURO ORS;  Service: Neurosurgery;  Laterality: Bilateral;  Bilateral deep brain stimulator placement    There were no vitals filed for this visit.  Subjective Assessment - 04/13/17 1338    Subjective  L rib cage improving, but continues tender to the touch. Pt notes several falls over the holiday; spouse reports a fall today going to the car for therapy without injury    Patient is accompained by:  Family member    Pertinent History  Patient referred for physical therapy due to wife's request for patient to get aquatic therapy to help with his conditioning. Pt is also complaining of chronic low back and R hip pain and frequent falls. 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 The last time he was in the water he was very uncomfortable because he didn't feel like he had any control. Pt has a history of 2 low back surgeries in August 2014 and February 2016. Pt and wife are unable to recall exactly what was performed but they know that he had stenosis. Surgeries were performed by Dr. Annette Stable in [place]. Pt  reports that his low back and R hip pain are constant. He describes the pain in the back as "dull" and the pain in the hip as "sharp." For both back and hip worst pain is 10/10, Best: 2/10, Present: 2/10. No particular episode of trauma to his low back or hip preceding the pain. Pt has a history of Parkinson's with a deep brain stimulator. He ambulates with a 4 wheeled walker called a U-step (automatic brakes, audio cues for stepping). He states that he falls frequently and reports that he has had approximately 40-50 falls in the last 6 months. No specific recent changes in his health.       Pt enters/exits via steps Participates in the  following  Stand core stabilization with LE exercises, UE support - march - hip abd - hip ext  Balance/coordination in stand, 20x ea - step out/ins - toe step taps R and L - STS from seated on step, no UE support  Seated core stabilization with LE work, 3 min each - bike - scissor - flutter - SKTC reverse crunch R/L, 2 x 10 each  Bench scoot, with core stab and sh/scap stab 2 L  Wall push up, 2 x 10   - Static stand by jet, 1 min x 2                         PT Education - 04/13/17 1340    Education provided  Yes    Education Details  stand wall pushup, bench scoot with abdominal stab and scap retraction/depression    Person(s) Educated  Patient       PT Short Term Goals - 04/13/17 1343      PT SHORT TERM GOAL #1   Title  Pt will perform HEP with family's supervision, for improved balance, transfers, and gait.      Baseline  becoming more compliant but not every day     Time  4    Period  Weeks    Status  Partially Met      PT SHORT TERM GOAL #2   Title  Pt will perform 5x sit<>stand in less than or equal to 15 seconds for improved efficiency and safety with transfers.    Baseline  17 seconds    Time  2    Period  Weeks    Status  New        PT Long Term Goals - 04/13/17 1343      PT LONG TERM GOAL #1   Title  Pt will decrease 5TSTS by at least 3 seconds in order to demonstrate clinically significant improvement in LE strength       PT LONG TERM GOAL #2   Title  Pt will decrease TUG by at least 3.4s in order to demonstrate decreased fall risk       PT LONG TERM GOAL #3   Title  Pt will improve BERG by at least 3 points in order to demonstrate clinically significant improvement in balance.      PT LONG TERM GOAL #4   Title  Pt/wife will verbalize understanding of plans for continued community fitness upon D/C from PT including aquatic therapy program      PT LONG TERM GOAL #5   Title  Pt will decrease mODI scoreby at least 13  points in order demonstrate clinically significant reduction in pain/disability       PT LONG TERM GOAL #6  Title  Pt will improve BERG to 44/56  in order to demonstrate clinically significant improvement in balance for decreased fall risk            Plan - 04/13/17 1341    Clinical Impression Statement  Pt demonstrates improved coordination this session with reciprocal/repetitive movements. Balance quite challenged today with difficulty keeping feet in contact with the ground during seated and when changing position from sit to stand.     Rehab Potential  Fair    PT Frequency  2x / week    PT Duration  8 weeks    PT Treatment/Interventions  ADLs/Self Care Home Management;Aquatic Therapy;Cryotherapy;Electrical Stimulation;Iontophoresis 33m/ml Dexamethasone;Moist Heat;Ultrasound;Contrast Bath;DME Instruction;Gait training;Stair training;Functional mobility training;Therapeutic activities;Therapeutic exercise;Balance training;Neuromuscular re-education;Patient/family education;Manual techniques;Wheelchair mobility training;Energy conservation;Passive range of motion    PT Next Visit Plan  progress balance, strength, aquatic therapy    PT Home Exercise Plan  None provided at this time until further assessed, wife reports pt [facility]    Consulted and Agree with Plan of Care  Patient;Family member/caregiver    Family Member Consulted  wife       Patient will benefit from skilled therapeutic intervention in order to improve the following deficits and impairments:  Abnormal gait, Decreased balance, Decreased coordination, Decreased mobility, Impaired tone, Postural dysfunction  Visit Diagnosis: Unspecified lack of coordination  PTSD (post-traumatic stress disorder)  [place]'s [place]  Other lack of coordination  Abnormal posture  Difficulty walking  Muscle weakness (generalized)  Repeated falls  Unsteadiness on feet     Problem  List Patient Active Problem List   Diagnosis Date Noted  . Arm injury, right, initial encounter 11/15/2016  . 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 Radicular pain in right arm 10/09/2016  . Rash 09/03/2016  . GERD (gastroesophageal reflux disease) 07/31/2016  . Trochanteric bursitis 03/03/2016  . Left hand pain 10/30/2015  . Fracture, finger, multiple sites 10/30/2015  . Colon cancer screening 07/23/2015  . Healthcare maintenance 07/23/2015  . Gout 02/18/2015  . Depression 01/06/2015  . Irritation of eyelid 08/20/2014  . Dupuytren's contracture 08/20/2014  . Cough 07/21/2014  . Lumbar stenosis with neurogenic claudication 06/13/2014  . Preop cardiovascular exam 05/28/2014  . S/P deep brain stimulator placement 05/08/2014  . Joint pain 01/22/2014  . Medicare annual wellness visit, initial 01/19/2014  . Advance care planning 01/19/2014  . [place] 12/13/2013  . PTSD (post-traumatic stress disorder) 06/13/2013  . Erectile dysfunction 06/13/2013  . HLD (hyperlipidemia) 06/13/2013  . Hip pain 06/10/2013  . Pain in joint, shoulder region 06/10/2013  . Right leg swelling 06/03/2013  . Essential hypertension 06/03/2013  . Bradycardia by electrocardiogram 06/03/2013  . Obstructive sleep apnea 03/12/2013  . Spinal stenosis, lumbar region, with neurogenic claudication 12/14/2012    HLarae Grooms12/27/2018, 1:47 PM  [place] [facility] [facility] [address] [place] [address]Phone: [phone]  Fax:  [phone] Name: TTARAY NORMOYLEMRN: [id number] of Birth: [birth year]

## 2017-04-19 ENCOUNTER — Ambulatory Visit: Payer: Self-pay

## 2017-04-20 ENCOUNTER — Ambulatory Visit (INDEPENDENT_AMBULATORY_CARE_PROVIDER_SITE_OTHER): Payer: Medicare PPO | Admitting: Psychology

## 2017-04-20 ENCOUNTER — Ambulatory Visit: Payer: Medicare PPO | Attending: Neurology

## 2017-04-20 ENCOUNTER — Telehealth: Payer: Self-pay | Admitting: Family Medicine

## 2017-04-20 DIAGNOSIS — M545 Low back pain: Secondary | ICD-10-CM | POA: Diagnosis present

## 2017-04-20 DIAGNOSIS — F331 Major depressive disorder, recurrent, moderate: Secondary | ICD-10-CM

## 2017-04-20 DIAGNOSIS — M25551 Pain in right hip: Secondary | ICD-10-CM | POA: Insufficient documentation

## 2017-04-20 DIAGNOSIS — G8929 Other chronic pain: Secondary | ICD-10-CM | POA: Insufficient documentation

## 2017-04-20 DIAGNOSIS — F4323 Adjustment disorder with mixed anxiety and depressed mood: Secondary | ICD-10-CM | POA: Diagnosis not present

## 2017-04-20 DIAGNOSIS — R278 Other lack of coordination: Secondary | ICD-10-CM | POA: Insufficient documentation

## 2017-04-20 DIAGNOSIS — R296 Repeated falls: Secondary | ICD-10-CM

## 2017-04-20 DIAGNOSIS — G20 Parkinson's disease: Secondary | ICD-10-CM | POA: Diagnosis not present

## 2017-04-20 DIAGNOSIS — R2689 Other abnormalities of gait and mobility: Secondary | ICD-10-CM | POA: Insufficient documentation

## 2017-04-20 DIAGNOSIS — R279 Unspecified lack of coordination: Secondary | ICD-10-CM | POA: Insufficient documentation

## 2017-04-20 DIAGNOSIS — R2681 Unsteadiness on feet: Secondary | ICD-10-CM | POA: Insufficient documentation

## 2017-04-20 DIAGNOSIS — M6281 Muscle weakness (generalized): Secondary | ICD-10-CM | POA: Diagnosis not present

## 2017-04-20 DIAGNOSIS — R293 Abnormal posture: Secondary | ICD-10-CM | POA: Insufficient documentation

## 2017-04-20 DIAGNOSIS — R262 Difficulty in walking, not elsewhere classified: Secondary | ICD-10-CM | POA: Diagnosis not present

## 2017-04-20 NOTE — Telephone Encounter (Signed)
Best number [phone]  Pt spouse [name]) has questions for dr Damita Dunnings.  She filled out triage form  Form in rx tower up front

## 2017-04-20 NOTE — Therapy (Signed)
[place] MAIN [facility] SERVICES [address] [place], [place], [address] Phone: [phone]   Fax:  [phone]  Physical Therapy Treatment  Patient Details  Name: [name] MRN: [id number] Date of Birth: 1950/05/09 Referring Provider: Alonza Bogus, DO   Encounter Date: 04/20/2017  PT End of Session - 04/20/17 1034    Visit Number  13    Number of Visits  33    Date for PT Re-Evaluation  05/30/17    PT Start Time  1030    PT Stop Time  1115    PT Time Calculation (min)  45 min       Past Medical History:  Diagnosis Date  . Arthritis   . Bradycardia   . Cancer [facility]) 2013   skin cancer  . Depression    ptsd  . Dysrhythmia    chronic slow heart rate  . GERD (gastroesophageal reflux disease)   . Headache(784.0)    tension headaches non recent  . History of chicken pox   . History of kidney stones   . Hypertension    treated with HCTZ  . Parkinson's disease ([place])    dx'ed 15 years ago  . PTSD (post-traumatic stress disorder)   . Shortness of breath dyspnea   . Sleep apnea    doesn't use C-pap  . Varicose veins     Past Surgical History:  Procedure Laterality Date  . CHOLECYSTECTOMY N/A 10/22/2014   Procedure: LAPAROSCOPIC CHOLECYSTECTOMY WITH INTRAOPERATIVE CHOLANGIOGRAM;  Surgeon: Dia Crawford III, MD;  Location: [facility] ORS;  Service: General;  Laterality: N/A;  . cyst removed      from lip as a child  . LUMBAR LAMINECTOMY/DECOMPRESSION MICRODISCECTOMY Bilateral 12/14/2012   Procedure: Bilateral lumbar three-four, four-five decompressive laminotomy/foraminotomy;  Surgeon: Charlie Pitter, MD;  Location: [place] NEURO ORS;  Service: Neurosurgery;  Laterality: Bilateral;  . PULSE GENERATOR IMPLANT Bilateral 12/13/2013   Procedure: Bilateral implantable pulse generator placement;  Surgeon: Erline Levine, MD;  Location: [place] NEURO ORS;  Service: Neurosurgery;  Laterality: Bilateral;  Bilateral implantable pulse generator placement  . skin cancer  removed     from ears,   12 lft arm  rt leg 15  . SUBTHALAMIC STIMULATOR INSERTION Bilateral 12/06/2013   Procedure: SUBTHALAMIC STIMULATOR INSERTION;  Surgeon: Erline Levine, MD;  Location: [place] NEURO ORS;  Service: Neurosurgery;  Laterality: Bilateral;  Bilateral deep brain stimulator placement    There were no vitals filed for this visit.  Subjective Assessment - 04/20/17 1032    Subjective  Patient reports some small falls since last session.  L rib occasionally tender to touch.  Reports doing some of the exercises at home.     Patient is accompained by:  Family member    Pertinent History  Patient referred for physical therapy due to wife's request for patient to get aquatic therapy to help with his conditioning. Pt is also complaining of chronic low back and R hip pain and frequent falls. 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 The last time he was in the water he was very uncomfortable because he didn't feel like he had any control. Pt has a history of 2 low back surgeries in August 2014 and February 2016. Pt and wife are unable to recall exactly what was performed but they know that he had stenosis. Surgeries were performed by Dr. Annette Stable in [place]. Pt reports that his low back  and R hip pain are constant. He describes the pain in the back as "dull" and the pain in the hip as "sharp." For both back and hip worst pain is 10/10, Best: 2/10, Present: 2/10. No particular episode of trauma to his low back or hip preceding the pain. Pt has a history of Parkinson's with a deep brain stimulator. He ambulates with a 4 wheeled walker called a U-step (automatic brakes, audio cues for stepping). He states that he falls frequently and reports that he has had approximately 40-50 falls in the last 6 months. No specific recent changes in his health.     Currently in Pain?  No/denies        Nustep lvl 3 4 minutes   TherEx rotational seated with  weighted ball taps on other side 2000 g 12x  seated UE raises 10x with weighted ball 2000g    Resisted pf GTB 15x PT counting to decrease amplitude   Seated marches 20x    Side stepping over hurdle Cues for sequencing, awareness of body positioning and spacing to prevent misstep to allow for improved bathroom mobility   Sit to stand 10x.   Exaggerated walking in // bars with increased knee/ hip flexion and heel strike 6x length of bars.   Standing hip flexion 10x each leg., requires verbal cueing for not extending trunk   Standing hip abduction 10x each leg  Standing hip extension 10x each leg, requires verbal cueing for not flexing trunk   Step over and back orange hurdle 15x each leg with BUE support. Cues for increasing step length.   Airex pad: horizontal and vertical head turns no LOB, slight posterior trunk lean  Airex: eyes closed, posterior trunk lean.   Airex marching : good control with no LOB, slight posterior trunk sway 20 x    Stand on half foam roller with CGA.  3x 20 seconds     Pt. response to medical necessity:  Patient will continue to benefit from skilled physical therapy to address deficits in strength, balance, mobility, and pain in order to improve function at home and decrease fall risk.                       PT Education - 04/20/17 1033    Education provided  Yes    Education Details  balance, safe mobility, posture    Person(s) Educated  Patient    Methods  Explanation;Demonstration;Verbal cues    Comprehension  Verbalized understanding;Returned demonstration;Verbal cues required       PT Short Term Goals - 04/13/17 1343      PT SHORT TERM GOAL #1   Title  Pt will perform HEP with family's supervision, for improved balance, transfers, and gait.      Baseline  becoming more compliant but not every day     Time  4    Period  Weeks    Status  Partially Met      PT SHORT TERM GOAL #2   Title  Pt will perform 5x sit<>stand  in less than or equal to 15 seconds for improved efficiency and safety with transfers.    Baseline  17 seconds    Time  2    Period  Weeks    Status  New        PT Long Term Goals - 04/13/17 1343      PT LONG TERM GOAL #1   Title  Pt will decrease 5TSTS by at  least 3 seconds in order to demonstrate clinically significant improvement in LE strength       PT LONG TERM GOAL #2   Title  Pt will decrease TUG by at least 3.4s in order to demonstrate decreased fall risk       PT LONG TERM GOAL #3   Title  Pt will improve BERG by at least 3 points in order to demonstrate clinically significant improvement in balance.      PT LONG TERM GOAL #4   Title  Pt/wife will verbalize understanding of plans for continued community fitness upon D/C from PT including aquatic therapy program      PT LONG TERM GOAL #5   Title  Pt will decrease mODI scoreby at least 13 points in order demonstrate clinically significant reduction in pain/disability       PT LONG TERM GOAL #6   Title  Pt will improve BERG to 44/56  in order to demonstrate clinically significant improvement in balance for decreased fall risk            Plan - 04/20/17 1100    Clinical Impression Statement  Patient demonstrates posterior LOB when performing dynamic and static balance. Increased posterior trunk lean noted with increased duration of static balance. Patient challenged by lifting LE's to clear obstacles. Patient will continue to benefit from skilled physical therapy to address deficits in strength, balance, mobility, and pain in order to improve function at home and decrease fall risk.    Rehab Potential  Fair    PT Frequency  2x / week    PT Duration  8 weeks    PT Treatment/Interventions  ADLs/Self Care Home Management;Aquatic Therapy;Cryotherapy;Electrical Stimulation;Iontophoresis '4mg'$ /ml Dexamethasone;Moist Heat;Ultrasound;Contrast Bath;DME Instruction;Gait training;Stair training;Functional mobility  training;Therapeutic activities;Therapeutic exercise;Balance training;Neuromuscular re-education;Patient/family education;Manual techniques;Wheelchair mobility training;Energy conservation;Passive range of motion    PT Next Visit Plan  progress balance, strength, aquatic therapy    PT Home Exercise Plan  None provided at this time until further assessed, wife reports pt does not perform current HEP from [facility] PT    Consulted and Agree with Plan of Care  Patient;Family member/caregiver    Family Member Consulted  wife       Patient will benefit from skilled therapeutic intervention in order to improve the following deficits and impairments:  Abnormal gait, Decreased balance, Decreased coordination, Decreased mobility, Impaired tone, Postural dysfunction  Visit Diagnosis: Muscle weakness (generalized)  Repeated falls  Unsteadiness on feet  Other abnormalities of gait and mobility     Problem List Patient Active Problem List   Diagnosis Date Noted  . Arm injury, right, initial encounter 11/15/2016  . 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 Radicular pain in right arm 10/09/2016  . Rash 09/03/2016  . GERD (gastroesophageal reflux disease) 07/31/2016  . Trochanteric bursitis 03/03/2016  . Left hand pain 10/30/2015  . Fracture, finger, multiple sites 10/30/2015  . Colon cancer screening 07/23/2015  . Healthcare maintenance 07/23/2015  . Gout 02/18/2015  . Depression 01/06/2015  . Irritation of eyelid 08/20/2014  . Dupuytren's contracture 08/20/2014  . Cough 07/21/2014  . Lumbar stenosis with neurogenic claudication 06/13/2014  . Preop cardiovascular exam 05/28/2014  . S/P deep brain stimulator placement 05/08/2014  . Joint pain 01/22/2014  . Medicare annual wellness visit, initial 01/19/2014  . Advance care planning 01/19/2014  . Parkinson's disease ([place]) 12/13/2013  . PTSD (post-traumatic stress disorder) 06/13/2013  . Erectile dysfunction 06/13/2013  . HLD (hyperlipidemia)  06/13/2013  . Hip pain 06/10/2013  .  Pain in joint, shoulder region 06/10/2013  . Right leg swelling 06/03/2013  . Essential hypertension 06/03/2013  . Bradycardia by electrocardiogram 06/03/2013  . Obstructive sleep apnea 03/12/2013  . Spinal stenosis, lumbar region, with neurogenic claudication 12/14/2012   Janna Arch, PT, DPT    Janna Arch 04/20/2017, 12:22 PM  [place] MAIN [facility] SERVICES [address] [place], [place], [address] Phone: [phone]   Fax:  [phone]  Name: [name] MRN: [id number] Date of Birth: [birth year]

## 2017-04-20 NOTE — Telephone Encounter (Signed)
Wife states:  "Leona is having a hard time with the fact that he was told he should not drive and not that he needs to give up his license.  His memory is getting worse and I feel it would be very dangerous if he drives."  Wife has an appointment with Dr. Glori Bickers on 04/25/17 at 3:15 pm and asks if there is any way should could speak with you for about 5 minutes while she is here?  [name] Darling

## 2017-04-21 NOTE — Progress Notes (Signed)
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 (post-traumatic stress disorder)   . Shortness of breath dyspnea   . Sleep apnea    doesn't use C-pap  . Varicose veins     PAST SURGICAL HISTORY:   Past Surgical History:  Procedure Laterality Date  . CHOLECYSTECTOMY N/A 10/22/2014   Procedure: LAPAROSCOPIC CHOLECYSTECTOMY WITH INTRAOPERATIVE CHOLANGIOGRAM;  Surgeon: Dia Crawford III, MD;  Location: [facility] ORS;  Service: General;  Laterality: N/A;  . cyst removed      from lip as a child  . LUMBAR LAMINECTOMY/DECOMPRESSION MICRODISCECTOMY Bilateral 12/14/2012   Procedure: Bilateral lumbar three-four, four-five decompressive laminotomy/foraminotomy;  Surgeon: Charlie Pitter, MD;  Location: [place] [facility];  Service: Neurosurgery;  Laterality: Bilateral;  . PULSE GENERATOR IMPLANT Bilateral 12/13/2013   Procedure: Bilateral implantable pulse generator placement;  Surgeon: Erline Levine, MD;  Location: [place] [facility];  Service: Neurosurgery;  Laterality: Bilateral;  Bilateral implantable pulse generator placement  . skin cancer removed     from ears,   12 lft arm  rt leg 15  . SUBTHALAMIC STIMULATOR INSERTION Bilateral 12/06/2013   Procedure: SUBTHALAMIC STIMULATOR INSERTION;  Surgeon: Erline Levine, MD;  Location: [place] [facility];  Service: Neurosurgery;  Laterality: Bilateral;  Bilateral  deep brain stimulator placement    SOCIAL HISTORY:   Social History    Socioeconomic History  . Marital status: Married    Spouse name: Not on file  . Number of children: Not on file  . Years of education: Not on file  . Highest education level: Not on file  Social Needs  . Financial resource strain: Not on file  . Food insecurity - worry: Not on file  . Food insecurity - inability: Not on file  . Transportation needs - medical: Not on file  . Transportation needs - non-medical: Not on file  Occupational History  . Occupation: disabled    Comment: 2001, [employer]    Comment: search and rescue helicopter  Tobacco Use  . Smoking status: Never Smoker  . Smokeless tobacco: Former Network engineer and Sexual Activity  . Alcohol use: Yes    Alcohol/week: 0.0 oz    Comment: occasional (twice per month)  . Drug use: No  . Sexual activity: No  Other Topics Concern  . Not on file  Social History Narrative   Previous [employer], CW-04   H/o PTSD.  Prev search and rescue work   3 kids from prev relationship.    Married 2014    FAMILY HISTORY:   Family Status  Relation Name Status  . Mother  Alive       Arthritis, unknown medical hx  . Father  Deceased       Lung Cancer  . Sister  Alive       1 and one half sister  . Brother  Alive  . Son  Alive  . Daughter  Alive       2  . Neg Hx  (Not Specified)    ROS:  A complete 10 system review of systems was obtained and was unremarkable apart from what is mentioned above.  PHYSICAL EXAMINATION:    VITALS:   Vitals:   04/24/17 0950  BP: 114/60  Pulse: (!) 50  SpO2: 95%  Weight: 188 lb (85.3 kg)  Height: 5\' 9"  (1.753 m)   Wt Readings from Last 3 Encounters:  04/24/17 188 lb (85.3 kg)  02/22/17 180 lb (81.6 kg)  12/28/16 180 lb (81.6 kg)     GEN:  The patient appears stated age and is in NAD.   More jovial and interactive HEENT:  Normocephalic, atraumatic.  The mucous membranes are moist. The superficial temporal arteries are without ropiness or tenderness. Cardiovascular: bradycardic.  Regular  rhythym. Lungs: Clear to auscultation bilaterally   Neurological examination:  Orientation: The patient is alert and oriented x3.  He is awake and more conversant today Cranial nerves: There is good facial symmetry.  The speech is fluent but dysarthric but better than last several visits.    Soft palate rises symmetrically and there is no tongue deviation. Hearing is intact to conversational tone. Sensation: Sensation is intact to light touch throughout. Motor: Strength is at least antigravity x 4. Deep tendon reflexes: Deferred today.  Movement examination: Tone: There is normal tone x 4 Abnormal movements: There is rare tremor in the LUE Coordination: There is good RAMs today Gait and Station: pt pushes off of the chair to get up.  He drags the right leg.  The examiner holds his arm to walk.  He is ataxic  DBS programming was not completed today  ASSESSMENT/PLAN:  1.  Parkinsons disease by hx.   -The patient is status post bilateral STN DBS on 12/06/2013 with battery  placement on 12/13/2013.  -off of mirapex and selegeline  -continue carbidopa/levodopa 25/100, 2/1/1 but often times not taking middle of the day dosage due to sleeping  -I believe that the patient would be an appropriate candidate for a motorized power wheelchair.   He has had near daily falls documented by physical therapy and calls to my office with a walker.  Explained to the patient that he should not be walking at all.  All transfers should happen with someone right next to him.  -Multiple forms filled out today for power wheelchair. 2.  LBP.   -Status post repeat L4-L5 laminotomy with foraminotomy and L1-L2 laminectomy on 06/13/2014.   3.  Dysphagia  - He did have a modified barium swallow on 08/07/2015.  It demonstrated mild oral pharyngeal dysphagia (an improvement from 2015 examination) and a regular diet with thin liquids was recommended.  4.  OSAS with excessive daytime hypersomnolence.  -He had a nocturnal  polysomnogram in November, 2018 demonstrating severe sleep apnea.  CPAP was recommended.  The patient has been called several times to get equipment but they haven't done that.  Encouraged them to call.  Discussed morbidity and mortality with sleep apnea 5.  Depression  -on Wellbutrin XL, 450 mg and lexapro and doing better.  Is doing better in this regard  -felt near suicidal on klonopin 6.  Early Hallucinations  -decided to hold on further meds right now.  Most hallucination inanimate objects that he knows are not real  -Took Nuplazid but felt didn't help.  He was on it for about 7 weeks before it was discontinued. 7.  RBD  -Still acting out the dreams some, but felt almost near suicidal on clonazepam.  Bedroom safety discussed.  Seeing Dr. Annamaria Boots 8. Bradycardia  -cardiology following.  Cardiology doesn't think that this is etiology for fatigue 9.  Parkinsons disease dementia  -He is now on Namenda, 10 mg twice per day. 10.  F/u at regular visit.  Much greater than 50% of this visit was spent in counseling and coordinating care.  Total face to face time:  25 min

## 2017-04-21 NOTE — Telephone Encounter (Signed)
We'll try at the time of the OV for brief conversation- okay to pull me if needed.  Thanks.

## 2017-04-21 NOTE — Telephone Encounter (Addendum)
[name] (wife) advised.  Apparently [name] is just wanting to speak with you about his driving issues.  Patient is becoming very angry about his not being allowed to drive and she would like for you to speak with him about it at his next OV because he needs to come in for an OV for incontinence anyway.  At that time, [name] would like for you to "breach" the subject rather than her because of the patient's anger issues.

## 2017-04-23 NOTE — Telephone Encounter (Signed)
Please schedule patient if not already scheduled.  I don't see anything in the EMR for [facility].  Thanks.

## 2017-04-24 ENCOUNTER — Encounter: Payer: Self-pay | Admitting: Neurology

## 2017-04-24 ENCOUNTER — Encounter: Payer: Self-pay | Admitting: Psychology

## 2017-04-24 ENCOUNTER — Ambulatory Visit (INDEPENDENT_AMBULATORY_CARE_PROVIDER_SITE_OTHER): Payer: Medicare PPO | Admitting: Neurology

## 2017-04-24 VITALS — BP 114/60 | HR 50 | Ht 69.0 in | Wt 188.0 lb

## 2017-04-24 DIAGNOSIS — G4733 Obstructive sleep apnea (adult) (pediatric): Secondary | ICD-10-CM

## 2017-04-24 DIAGNOSIS — G20 Parkinson's disease: Secondary | ICD-10-CM | POA: Diagnosis not present

## 2017-04-24 DIAGNOSIS — G20A1 Parkinson's disease without dyskinesia, without mention of fluctuations: Secondary | ICD-10-CM

## 2017-04-24 DIAGNOSIS — F0280 Dementia in other diseases classified elsewhere without behavioral disturbance: Secondary | ICD-10-CM

## 2017-04-24 NOTE — Progress Notes (Signed)
I met with the patient and his caregiver today while they were in the clinic.  We talked a little bit about the upcoming caregiver support group.  In addition, we talked about creating a respite care plan for an upcoming vacation that the caregiver would like to go on.  The caregiver would like to be able to visit her son who lives in [place].  This would entail 2 weeks away from her husband.  We started conversation to identify the things that would enable her to be most comfortable away from her husband and vice versa.  It sounds like she would like to utilize the patient's son to come stay with him for the 2 weeks.  This medication will be in the late fall.  I encouraged her to call me as she makes plans and continues to firm up her respite care needs when the vacation is getting closer.

## 2017-04-25 ENCOUNTER — Telehealth: Payer: Self-pay | Admitting: Internal Medicine

## 2017-04-25 ENCOUNTER — Other Ambulatory Visit: Payer: Self-pay

## 2017-04-25 ENCOUNTER — Ambulatory Visit: Payer: Medicare PPO

## 2017-04-25 DIAGNOSIS — R2681 Unsteadiness on feet: Secondary | ICD-10-CM | POA: Diagnosis not present

## 2017-04-25 DIAGNOSIS — R278 Other lack of coordination: Secondary | ICD-10-CM

## 2017-04-25 DIAGNOSIS — G4733 Obstructive sleep apnea (adult) (pediatric): Secondary | ICD-10-CM

## 2017-04-25 DIAGNOSIS — M545 Low back pain, unspecified: Secondary | ICD-10-CM

## 2017-04-25 DIAGNOSIS — M25551 Pain in right hip: Secondary | ICD-10-CM

## 2017-04-25 DIAGNOSIS — R2689 Other abnormalities of gait and mobility: Secondary | ICD-10-CM | POA: Diagnosis not present

## 2017-04-25 DIAGNOSIS — R293 Abnormal posture: Secondary | ICD-10-CM

## 2017-04-25 DIAGNOSIS — R262 Difficulty in walking, not elsewhere classified: Secondary | ICD-10-CM | POA: Diagnosis not present

## 2017-04-25 DIAGNOSIS — G20 Parkinson's disease: Secondary | ICD-10-CM

## 2017-04-25 DIAGNOSIS — R296 Repeated falls: Secondary | ICD-10-CM

## 2017-04-25 DIAGNOSIS — R279 Unspecified lack of coordination: Secondary | ICD-10-CM | POA: Diagnosis not present

## 2017-04-25 DIAGNOSIS — M6281 Muscle weakness (generalized): Secondary | ICD-10-CM | POA: Diagnosis not present

## 2017-04-25 DIAGNOSIS — G8929 Other chronic pain: Secondary | ICD-10-CM

## 2017-04-25 NOTE — Telephone Encounter (Signed)
Called and spoke with pt's spouse, [name]. [name] is requesting pt's sleep study results from 03/01/17.  CY please advise. Thanks.

## 2017-04-25 NOTE — Telephone Encounter (Signed)
Yes- he qualifies for CPAP. His study showed about 34 apneas/ hour.  Recommend order CPAP auto 5-15, mask of choice, humidifier, supplies, AirView  Dx OSA  Please make sure he has a follow-up appointment w/in 31-90 days as required by insurance.

## 2017-04-25 NOTE — Telephone Encounter (Signed)
lmtcb X1 for pt/spouse. Will order cpap after speaking to pt. Pt will also need a rov as he is not currently scheduled.

## 2017-04-25 NOTE — Therapy (Signed)
[place] MAIN [facility] SERVICES [address] [place], [place], [address] Phone: [phone]   Fax:  [phone]  Physical Therapy Treatment  Patient Details  Name: [name] MRN: [id number] Date of Birth: 1950-06-28 Referring Provider: Alonza Bogus, DO   Encounter Date: 04/25/2017  PT End of Session - 04/25/17 1409    Visit Number  14    Number of Visits  33    Date for PT Re-Evaluation  05/30/17    PT Start Time  0950    PT Stop Time  1050    PT Time Calculation (min)  60 min    Activity Tolerance  Patient tolerated treatment well    Behavior During Therapy  [facility] for tasks assessed/performed       Past Medical History:  Diagnosis Date  . Arthritis   . Bradycardia   . Cancer [facility]) 2013   skin cancer  . Depression    ptsd  . Dysrhythmia    chronic slow heart rate  . GERD (gastroesophageal reflux disease)   . Headache(784.0)    tension headaches non recent  . History of chicken pox   . History of kidney stones   . Hypertension    treated with HCTZ  . Parkinson's disease ([place])    dx'ed 15 years ago  . PTSD (post-traumatic stress disorder)   . Shortness of breath dyspnea   . Sleep apnea    doesn't use C-pap  . Varicose veins     Past Surgical History:  Procedure Laterality Date  . CHOLECYSTECTOMY N/A 10/22/2014   Procedure: LAPAROSCOPIC CHOLECYSTECTOMY WITH INTRAOPERATIVE CHOLANGIOGRAM;  Surgeon: Dia Crawford III, MD;  Location: [facility] ORS;  Service: General;  Laterality: N/A;  . cyst removed      from lip as a child  . LUMBAR LAMINECTOMY/DECOMPRESSION MICRODISCECTOMY Bilateral 12/14/2012   Procedure: Bilateral lumbar three-four, four-five decompressive laminotomy/foraminotomy;  Surgeon: Charlie Pitter, MD;  Location: [place] [facility] OR[facility];  Service: Neurosurgery;  Laterality: Bilateral;  . PULSE GENERATOR IMPLANT Bilateral 12/13/2013   Procedure: Bilateral implantable pulse generator placement;  Surgeon: Erline Levine, MD;  Location: [place]  NEURO ORS;  Service: Neurosurgery;  Laterality: Bilateral;  Bilateral implantable pulse generator placement  . skin cancer removed     from ears,   12 lft arm  rt leg 15  . SUBTHALAMIC STIMULATOR INSERTION Bilateral 12/06/2013   Procedure: SUBTHALAMIC STIMULATOR INSERTION;  Surgeon: Erline Levine, MD;  Location: [place] [facility] ORS;  Service: Neurosurgery;  Laterality: Bilateral;  Bilateral deep brain stimulator placement    There were no vitals filed for this visit.  Subjective Assessment - 04/25/17 1404    Subjective  Pt reports 4 falls yesterday, "but not as bad". No injuries as a result.     Patient is accompained by:  Family member    Pertinent History  Patient referred for physical therapy due to wife's request for patient to get aquatic therapy to help with his conditioning. Pt is also complaining of chronic low back and R hip pain and frequent falls. 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 The last time he was in the water he was very uncomfortable because he didn't feel like he had any control. Pt has a history of 2 low back surgeries in August 2014 and February 2016. Pt and wife are unable to recall exactly what was performed but they know that he had stenosis. Surgeries were performed  by Dr. Annette Stable in [place]. Pt reports that his low back and R hip pain are constant. He describes the pain in the back as "dull" and the pain in the hip as "sharp." For both back and hip worst pain is 10/10, Best: 2/10, Present: 2/10. No particular episode of trauma to his low back or hip preceding the pain. Pt has a history of Parkinson's with a deep brain stimulator. He ambulates with a 4 wheeled walker called a U-step (automatic brakes, audio cues for stepping). He states that he falls frequently and reports that he has had approximately 40-50 falls in the last 6 months. No specific recent changes in his health.       Enters/exits pool via ramp Participates  in the following  Ambulation - 4 L fwd - 4 L side  Bench with core stabilization; coordination - Bike 4 min - Flutter with back supported, 3 x 1 min - Scissor with back supported, 3 x 1 min - STS, no UE support with focal point, 20x  Balance/coordination with focal point - Stand march, no UE support, sets at tolerated for 56mn - Static stand with mod perturbations, 5 min - Single leg step taps 1 UE support, 5 x 10 each - Alternating step taps no UE support, 2 x 10   Supine float with noodles, 2L  Supine float with backstroke arms coordination, 6L                           PT Education - 04/25/17 1405    Education provided  Yes    Education Details  Discussion with pt and again with pt/spouse regarding use of an assistive devices at all times, even short distances to avoid falls. Pt states his neurologist told him the same thing yesterday. Of note pt walks across pool deck prior to session without rollator that he arrived with. Strongly encouraged use. Also educated on focal point for improved balance during functional static/dynamic activities. Backstroke        PT Short Term Goals - 04/25/17 1412      PT SHORT TERM GOAL #1   Title  Pt will perform HEP with family's supervision, for improved balance, transfers, and gait.      Baseline  becoming more compliant but not every day     Time  4    Period  Weeks    Status  Partially Met      PT SHORT TERM GOAL #2   Title  Pt will perform 5x sit<>stand in less than or equal to 15 seconds for improved efficiency and safety with transfers.    Baseline  17 seconds    Time  2    Period  Weeks    Status  New        PT Long Term Goals - 04/25/17 1412      PT LONG TERM GOAL #1   Title  Pt will decrease 5TSTS by at least 3 seconds in order to demonstrate clinically significant improvement in LE strength       PT LONG TERM GOAL #2   Title  Pt will decrease TUG by at least 3.4s in order to demonstrate  decreased fall risk       PT LONG TERM GOAL #3   Title  Pt will improve BERG by at least 3 points in order to demonstrate clinically significant improvement in balance.      PT LONG TERM GOAL #  4   Title  Pt/wife will verbalize understanding of plans for continued community fitness upon D/C from PT including aquatic therapy program      PT LONG TERM GOAL #5   Title  Pt will decrease mODI scoreby at least 13 points in order demonstrate clinically significant reduction in pain/disability       PT LONG TERM GOAL #6   Title  Pt will improve BERG to 44/56  in order to demonstrate clinically significant improvement in balance for decreased fall risk            Plan - 04/25/17 1409    Clinical Impression Statement  Pt notices improved balance with use of focal point this session. With pt realization this session, therapist with high hopes pt will carryover and use at home/community as well. Pt demonstrating improved coordination this session with bench activities, step activities and backstroke swim.     Rehab Potential  Fair    PT Frequency  2x / week    PT Duration  8 weeks    PT Treatment/Interventions  ADLs/Self Care Home Management;Aquatic Therapy;Cryotherapy;Electrical Stimulation;Iontophoresis '4mg'$ /ml Dexamethasone;Moist Heat;Ultrasound;Contrast Bath;DME Instruction;Gait training;Stair training;Functional mobility training;Therapeutic activities;Therapeutic exercise;Balance training;Neuromuscular re-education;Patient/family education;Manual techniques;Wheelchair mobility training;Energy conservation;Passive range of motion    PT Next Visit Plan  progress balance, strength, aquatic therapy    PT Home Exercise Plan  None provided at this time until further assessed, wife reports pt does not perform current HEP from [facility] PT    Consulted and Agree with Plan of Care  Patient;Family member/caregiver    Family Member Consulted  wife       Patient will benefit from skilled therapeutic  intervention in order to improve the following deficits and impairments:  Abnormal gait, Decreased balance, Decreased coordination, Decreased mobility, Impaired tone, Postural dysfunction  Visit Diagnosis: Muscle weakness (generalized)  Repeated falls  Unsteadiness on feet  Other abnormalities of gait and mobility  Parkinson's disease ([facility])  Unspecified lack of coordination  Other lack of coordination  Abnormal posture  Difficulty walking  Chronic midline low back pain without sciatica  Pain in right hip     Problem List Patient Active Problem List   Diagnosis Date Noted  . Arm injury, right, initial encounter 11/15/2016  . 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 Radicular pain in right arm 10/09/2016  . Rash 09/03/2016  . GERD (gastroesophageal reflux disease) 07/31/2016  . Trochanteric bursitis 03/03/2016  . Left hand pain 10/30/2015  . Fracture, finger, multiple sites 10/30/2015  . Colon cancer screening 07/23/2015  . Healthcare maintenance 07/23/2015  . Gout 02/18/2015  . Depression 01/06/2015  . Irritation of eyelid 08/20/2014  . Dupuytren's contracture 08/20/2014  . Cough 07/21/2014  . Lumbar stenosis with neurogenic claudication 06/13/2014  . Preop cardiovascular exam 05/28/2014  . S/P deep brain stimulator placement 05/08/2014  . Joint pain 01/22/2014  . Medicare annual wellness visit, initial 01/19/2014  . Advance care planning 01/19/2014  . Parkinson's disease ([place]) 12/13/2013  . PTSD (post-traumatic stress disorder) 06/13/2013  . Erectile dysfunction 06/13/2013  . HLD (hyperlipidemia) 06/13/2013  . Hip pain 06/10/2013  . Pain in joint, shoulder region 06/10/2013  . Right leg swelling 06/03/2013  . Essential hypertension 06/03/2013  . Bradycardia by electrocardiogram 06/03/2013  . Obstructive sleep apnea 03/12/2013  . Spinal stenosis, lumbar region, with neurogenic claudication 12/14/2012    Larae Grooms 04/25/2017, 2:13 PM  [place] MAIN [facility] SERVICES [place], [place], [address] Phone:  [phone]   Fax:  [phone]  Name: [name] MRN: [id number] Date of Birth: Mar 17, 1951

## 2017-04-26 NOTE — Telephone Encounter (Signed)
Pt's spouse is aware of results and voiced her understanding.  Order for Cpap has been placed to [facility].  Pt has been scheduled for OV with CY on 07/27/17 Nothing further is needed.

## 2017-04-27 ENCOUNTER — Ambulatory Visit: Payer: Medicare PPO

## 2017-04-27 DIAGNOSIS — R2689 Other abnormalities of gait and mobility: Secondary | ICD-10-CM

## 2017-04-27 DIAGNOSIS — R296 Repeated falls: Secondary | ICD-10-CM | POA: Diagnosis not present

## 2017-04-27 DIAGNOSIS — M6281 Muscle weakness (generalized): Secondary | ICD-10-CM | POA: Diagnosis not present

## 2017-04-27 DIAGNOSIS — R2681 Unsteadiness on feet: Secondary | ICD-10-CM | POA: Diagnosis not present

## 2017-04-27 DIAGNOSIS — R278 Other lack of coordination: Secondary | ICD-10-CM | POA: Diagnosis not present

## 2017-04-27 DIAGNOSIS — G20 Parkinson's disease: Secondary | ICD-10-CM | POA: Diagnosis not present

## 2017-04-27 DIAGNOSIS — R279 Unspecified lack of coordination: Secondary | ICD-10-CM | POA: Diagnosis not present

## 2017-04-27 DIAGNOSIS — R293 Abnormal posture: Secondary | ICD-10-CM | POA: Diagnosis not present

## 2017-04-27 DIAGNOSIS — R262 Difficulty in walking, not elsewhere classified: Secondary | ICD-10-CM | POA: Diagnosis not present

## 2017-04-27 NOTE — Therapy (Signed)
[place] MAIN [facility] SERVICES [address] [place], [place], [address] Phone: [phone]   Fax:  [phone]  Physical Therapy Treatment  Patient Details  Name: [name] MRN: [id number] Date of Birth: Jan 05, 1951 Referring Provider: Alonza Bogus, DO   Encounter Date: 04/27/2017  PT End of Session - 04/27/17 1218    Visit Number  15    Number of Visits  33    Date for PT Re-Evaluation  05/30/17    PT Start Time  0930    PT Stop Time  1015    PT Time Calculation (min)  45 min    Activity Tolerance  Patient tolerated treatment well    Behavior During Therapy  [facility] for tasks assessed/performed       Past Medical History:  Diagnosis Date  . Arthritis   . Bradycardia   . Cancer [facility]) 2013   skin cancer  . Depression    ptsd  . Dysrhythmia    chronic slow heart rate  . GERD (gastroesophageal reflux disease)   . Headache(784.0)    tension headaches non recent  . History of chicken pox   . History of kidney stones   . Hypertension    treated with HCTZ  . Parkinson's disease ([place])    dx'ed 15 years ago  . PTSD (post-traumatic stress disorder)   . Shortness of breath dyspnea   . Sleep apnea    doesn't use C-pap  . Varicose veins     Past Surgical History:  Procedure Laterality Date  . CHOLECYSTECTOMY N/A 10/22/2014   Procedure: LAPAROSCOPIC CHOLECYSTECTOMY WITH INTRAOPERATIVE CHOLANGIOGRAM;  Surgeon: Dia Crawford III, MD;  Location: [facility] ORS;  Service: General;  Laterality: N/A;  . cyst removed      from lip as a child  . LUMBAR LAMINECTOMY/DECOMPRESSION MICRODISCECTOMY Bilateral 12/14/2012   Procedure: Bilateral lumbar three-four, four-five decompressive laminotomy/foraminotomy;  Surgeon: Charlie Pitter, MD;  Location: [place] NEURO ORS;  Service: Neurosurgery;  Laterality: Bilateral;  . PULSE GENERATOR IMPLANT Bilateral 12/13/2013   Procedure: Bilateral implantable pulse generator placement;  Surgeon: Erline Levine, MD;  Location: [place]  NEURO ORS;  Service: Neurosurgery;  Laterality: Bilateral;  Bilateral implantable pulse generator placement  . skin cancer removed     from ears,   12 lft arm  rt leg 15  . SUBTHALAMIC STIMULATOR INSERTION Bilateral 12/06/2013   Procedure: SUBTHALAMIC STIMULATOR INSERTION;  Surgeon: Erline Levine, MD;  Location: [place] NEURO ORS;  Service: Neurosurgery;  Laterality: Bilateral;  Bilateral deep brain stimulator placement    There were no vitals filed for this visit.  Subjective Assessment - 04/27/17 0935    Subjective  Patient reports having a couple of bad days since seen last in land therapy. Approximates about 9 falls total. Went to doctor who wants patient in [facility] or with walker at all times.     Patient is accompained by:  Family member    Pertinent History  Patient referred for physical therapy due to wife's request for patient to get aquatic therapy to help with his conditioning. Pt is also complaining of chronic low back and R hip pain and frequent falls. 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 The last time he was in the water he was very uncomfortable because he didn't feel like he had any control. Pt has a history of 2 low back surgeries in August 2014 and February 2016. Pt and  wife are unable to recall exactly what was performed but they know that he had stenosis. Surgeries were performed by Dr. Annette Stable in [place]. Pt reports that his low back and R hip pain are constant. He describes the pain in the back as "dull" and the pain in the hip as "sharp." For both back and hip worst pain is 10/10, Best: 2/10, Present: 2/10. No particular episode of trauma to his low back or hip preceding the pain. Pt has a history of Parkinson's with a deep brain stimulator. He ambulates with a 4 wheeled walker called a U-step (automatic brakes, audio cues for stepping). He states that he falls frequently and reports that he has had approximately 40-50 falls in the  last 6 months. No specific recent changes in his health.     Currently in Pain?  No/denies         Treat:  #5 lb ankle weights seated  Marching 2 15x, cues for edge of seat.   LAQ 2x 12 with cues for keeping back not against chair for core and posture stability    Side stepping in // bars with RTB around toes for gluteal activation 4x length of bars.   Heel raises 20x   Alternating toe raises 10x each foot standing  High knee walking in // bars 4x  Stand pivot transfer from walker to chair. X2, cues for keeping feet within walker, upright posture   Stand: throw football back and forth with intern while PT performs CGA in // bars .   6" step toe taps occasional posterior LOB  Step over and back half foam roller 15x with decreasing UE support  Side step over and back over half foam roller 15x with SUE support, cues for taking larger step   Step from red band to green band to increase step length in // bars.      PT Short Term Goals - 04/25/17 1412      PT SHORT TERM GOAL #1   Title  Pt will perform HEP with family's supervision, for improved balance, transfers, and gait.      Baseline  becoming more compliant but not every day     Time  4    Period  Weeks    Status  Partially Met      PT SHORT TERM GOAL #2   Title  Pt will perform 5x sit<>stand in less than or equal to 15 seconds for improved efficiency and safety with transfers.    Baseline  17 seconds    Time  2    Period  Weeks    Status  New        PT Long Term Goals - 04/25/17 1412      PT LONG TERM GOAL #1   Title  Pt will decrease 5TSTS by at least 3 seconds in order to demonstrate clinically significant improvement in LE strength       PT LONG TERM GOAL #2   Title  Pt will decrease TUG by at least 3.4s in order to demonstrate decreased fall risk       PT LONG TERM GOAL #3   Title  Pt will improve BERG by at least 3 points in order to demonstrate clinically significant improvement in balance.       PT LONG TERM GOAL #4   Title  Pt/wife will verbalize understanding of plans for continued community fitness upon D/C from PT including aquatic therapy program      PT  LONG TERM GOAL #5   Title  Pt will decrease mODI scoreby at least 13 points in order demonstrate clinically significant reduction in pain/disability       PT LONG TERM GOAL #6   Title  Pt will improve BERG to 44/56  in order to demonstrate clinically significant improvement in balance for decreased fall risk            Plan - 04/27/17 1220    Clinical Impression Statement  Patient progressing with functional mobility with decreased posterior LOB. Patient requiring frequent cueing for maintaining upright position. Increased smoothness of movements with decreased akinesia present. Patient will continue to benefit from skilled physical therapy to address deficits in strength, balance, mobility, and pain in order to improve function at home and decrease fall risk.     Rehab Potential  Fair    PT Frequency  2x / week    PT Duration  8 weeks    PT Treatment/Interventions  ADLs/Self Care Home Management;Aquatic Therapy;Cryotherapy;Electrical Stimulation;Iontophoresis '4mg'$ /ml Dexamethasone;Moist Heat;Ultrasound;Contrast Bath;DME Instruction;Gait training;Stair training;Functional mobility training;Therapeutic activities;Therapeutic exercise;Balance training;Neuromuscular re-education;Patient/family education;Manual techniques;Wheelchair mobility training;Energy conservation;Passive range of motion    PT Next Visit Plan  progress balance, strength, aquatic therapy    PT Home Exercise Plan  None provided at this time until further assessed, wife reports pt does not perform current HEP from [facility] PT    Consulted and Agree with Plan of Care  Patient;Family member/caregiver    Family Member Consulted  wife       Patient will benefit from skilled therapeutic intervention in order to improve the following deficits and impairments:  Abnormal  gait, Decreased balance, Decreased coordination, Decreased mobility, Impaired tone, Postural dysfunction  Visit Diagnosis: Muscle weakness (generalized)  Repeated falls  Unsteadiness on feet  Other abnormalities of gait and mobility     Problem List Patient Active Problem List   Diagnosis Date Noted  . Arm injury, right, initial encounter 11/15/2016  . 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 Radicular pain in right arm 10/09/2016  . Rash 09/03/2016  . GERD (gastroesophageal reflux disease) 07/31/2016  . Trochanteric bursitis 03/03/2016  . Left hand pain 10/30/2015  . Fracture, finger, multiple sites 10/30/2015  . Colon cancer screening 07/23/2015  . Healthcare maintenance 07/23/2015  . Gout 02/18/2015  . Depression 01/06/2015  . Irritation of eyelid 08/20/2014  . Dupuytren's contracture 08/20/2014  . Cough 07/21/2014  . Lumbar stenosis with neurogenic claudication 06/13/2014  . Preop cardiovascular exam 05/28/2014  . S/P deep brain stimulator placement 05/08/2014  . Joint pain 01/22/2014  . Medicare annual wellness visit, initial 01/19/2014  . Advance care planning 01/19/2014  . Parkinson's disease ([place]) 12/13/2013  . PTSD (post-traumatic stress disorder) 06/13/2013  . Erectile dysfunction 06/13/2013  . HLD (hyperlipidemia) 06/13/2013  . Hip pain 06/10/2013  . Pain in joint, shoulder region 06/10/2013  . Right leg swelling 06/03/2013  . Essential hypertension 06/03/2013  . Bradycardia by electrocardiogram 06/03/2013  . Obstructive sleep apnea 03/12/2013  . Spinal stenosis, lumbar region, with neurogenic claudication 12/14/2012   Janna Arch, PT, DPT   Janna Arch 04/27/2017, 12:22 PM  [place] MAIN [facility] SERVICES [address] [place], [place], [address] Phone: [phone]   Fax:  [phone]  Name: DEREC MOZINGO MRN: [id number] Date of Birth: [birth year]

## 2017-05-01 ENCOUNTER — Ambulatory Visit: Payer: Self-pay | Admitting: Family Medicine

## 2017-05-02 ENCOUNTER — Other Ambulatory Visit: Payer: Self-pay

## 2017-05-02 ENCOUNTER — Ambulatory Visit: Payer: Medicare PPO

## 2017-05-02 DIAGNOSIS — G20 Parkinson's disease: Secondary | ICD-10-CM

## 2017-05-02 DIAGNOSIS — R2689 Other abnormalities of gait and mobility: Secondary | ICD-10-CM

## 2017-05-02 DIAGNOSIS — M6281 Muscle weakness (generalized): Secondary | ICD-10-CM

## 2017-05-02 DIAGNOSIS — R278 Other lack of coordination: Secondary | ICD-10-CM | POA: Diagnosis not present

## 2017-05-02 DIAGNOSIS — R296 Repeated falls: Secondary | ICD-10-CM

## 2017-05-02 DIAGNOSIS — R2681 Unsteadiness on feet: Secondary | ICD-10-CM | POA: Diagnosis not present

## 2017-05-02 DIAGNOSIS — R279 Unspecified lack of coordination: Secondary | ICD-10-CM | POA: Diagnosis not present

## 2017-05-02 DIAGNOSIS — R262 Difficulty in walking, not elsewhere classified: Secondary | ICD-10-CM

## 2017-05-02 DIAGNOSIS — R293 Abnormal posture: Secondary | ICD-10-CM | POA: Diagnosis not present

## 2017-05-02 NOTE — Therapy (Signed)
[place] MAIN [facility] SERVICES [address] [place], [place], [address] Phone: [phone]   Fax:  [phone]  Physical Therapy Treatment  Patient Details  Name: [name] MRN: [id number] Date of Birth: Apr 11, 1951 Referring Provider: Alonza Bogus, DO   Encounter Date: 05/02/2017  PT End of Session - 05/02/17 1349    Visit Number  16    Number of Visits  33    Date for PT Re-Evaluation  05/30/17    PT Start Time  0800    PT Stop Time  0855    PT Time Calculation (min)  55 min    Activity Tolerance  Patient tolerated treatment well    Behavior During Therapy  [facility] for tasks assessed/performed       Past Medical History:  Diagnosis Date  . Arthritis   . Bradycardia   . Cancer [facility]) 2013   skin cancer  . Depression    ptsd  . Dysrhythmia    chronic slow heart rate  . GERD (gastroesophageal reflux disease)   . Headache(784.0)    tension headaches non recent  . History of chicken pox   . History of kidney stones   . Hypertension    treated with HCTZ  . Parkinson's disease ([place])    dx'ed 15 years ago  . PTSD (post-traumatic stress disorder)   . Shortness of breath dyspnea   . Sleep apnea    doesn't use C-pap  . Varicose veins     Past Surgical History:  Procedure Laterality Date  . CHOLECYSTECTOMY N/A 10/22/2014   Procedure: LAPAROSCOPIC CHOLECYSTECTOMY WITH INTRAOPERATIVE CHOLANGIOGRAM;  Surgeon: Dia Crawford III, MD;  Location: [facility] ORS;  Service: General;  Laterality: N/A;  . cyst removed      from lip as a child  . LUMBAR LAMINECTOMY/DECOMPRESSION MICRODISCECTOMY Bilateral 12/14/2012   Procedure: Bilateral lumbar three-four, four-five decompressive laminotomy/foraminotomy;  Surgeon: Charlie Pitter, MD;  Location: [place] NEURO ORS;  Service: Neurosurgery;  Laterality: Bilateral;  . PULSE GENERATOR IMPLANT Bilateral 12/13/2013   Procedure: Bilateral implantable pulse generator placement;  Surgeon: Erline Levine, MD;  Location: [place]  NEURO ORS;  Service: Neurosurgery;  Laterality: Bilateral;  Bilateral implantable pulse generator placement  . skin cancer removed     from ears,   12 lft arm  rt leg 15  . SUBTHALAMIC STIMULATOR INSERTION Bilateral 12/06/2013   Procedure: SUBTHALAMIC STIMULATOR INSERTION;  Surgeon: Erline Levine, MD;  Location: [place] NEURO ORS;  Service: Neurosurgery;  Laterality: Bilateral;  Bilateral deep brain stimulator placement    There were no vitals filed for this visit.  Subjective Assessment - 05/02/17 1340    Subjective  Pt reports decreased falls and more compliance with using an assistive device; pt is to receive power chair this week. Pt does note his legs are feeling stronger overall.     Patient is accompained by:  Family member    Pertinent History  Patient referred for physical therapy due to wife's request for patient to get aquatic therapy to help with his conditioning. Pt is also complaining of chronic low back and R hip pain and frequent falls. 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 The last time he was in the water he was very uncomfortable because he didn't feel like he had any control. Pt has a history of 2 low back surgeries in August 2014 and February 2016. Pt and wife are unable to  recall exactly what was performed but they know that he had stenosis. Surgeries were performed by Dr. Annette Stable in [place]. Pt reports that his low back and R hip pain are constant. He describes the pain in the back as "dull" and the pain in the hip as "sharp." For both back and hip worst pain is 10/10, Best: 2/10, Present: 2/10. No particular episode of trauma to his low back or hip preceding the pain. Pt has a history of Parkinson's with a deep brain stimulator. He ambulates with a 4 wheeled walker called a U-step (automatic brakes, audio cues for stepping). He states that he falls frequently and reports that he has had approximately 40-50 falls in the last 6  months. No specific recent changes in his health.      Pt demonstrates much improved ambulation to/from pool ramp with use of rollator.  Enters/exits pool via ramp Participates in the following  Ambulation with kick boards for UE support/posture - 4 L fwd - 4 L side  Bench LE/core strengthening - B hip ext seated with dumbbell float, 20x ea - Bike, flutter, scissor, 3 min ea - STS no UE support 25x;   Step activities - B single leg squat on step, 1 UE support, 20x ea - B step up/downs, R/L lead 20x ea  Rail  - Deep squat, 10x - Resisted deep squat with dumbbells, 20x  Supine float with noodles performing backstroke arms, 6 L                         PT Education - 05/02/17 1347    Education provided  Yes    Education Details  Single leg squats on step, seated leg ext with dumbell float, resisted squat with dumbell floats    Person(s) Educated  Patient    Methods  Explanation;Demonstration;Verbal cues;Tactile cues    Comprehension  Verbalized understanding;Verbal cues required;Tactile cues required       PT Short Term Goals - 05/02/17 1353      PT SHORT TERM GOAL #1   Title  Pt will perform HEP with family's supervision, for improved balance, transfers, and gait.      Baseline  becoming more compliant but not every day     Time  4    Period  Weeks    Status  Partially Met      PT SHORT TERM GOAL #2   Title  Pt will perform 5x sit<>stand in less than or equal to 15 seconds for improved efficiency and safety with transfers.    Baseline  17 seconds    Time  2    Period  Weeks    Status  New        PT Long Term Goals - 05/02/17 1353      PT LONG TERM GOAL #1   Title  Pt will decrease 5TSTS by at least 3 seconds in order to demonstrate clinically significant improvement in LE strength       PT LONG TERM GOAL #2   Title  Pt will decrease TUG by at least 3.4s in order to demonstrate decreased fall risk       PT LONG TERM GOAL #3   Title  Pt  will improve BERG by at least 3 points in order to demonstrate clinically significant improvement in balance.      PT LONG TERM GOAL #4   Title  Pt/wife will verbalize understanding of plans for continued community fitness  upon D/C from PT including aquatic therapy program      PT LONG TERM GOAL #5   Title  Pt will decrease mODI scoreby at least 13 points in order demonstrate clinically significant reduction in pain/disability       PT LONG TERM GOAL #6   Title  Pt will improve BERG to 44/56  in order to demonstrate clinically significant improvement in balance for decreased fall risk            Plan - 05/02/17 1350    Clinical Impression Statement  Pt performed walking with kick boards forward and side this session without any assist to maintain balance; only close supervision. Progressed strengthening program for core/LEs with resisted squats, resisted seated hip extension and single leg squats. Continued progress with supine UE backstroke swim.     Rehab Potential  Fair    PT Frequency  2x / week    PT Duration  8 weeks    PT Treatment/Interventions  ADLs/Self Care Home Management;Aquatic Therapy;Cryotherapy;Electrical Stimulation;Iontophoresis '4mg'$ /ml Dexamethasone;Moist Heat;Ultrasound;Contrast Bath;DME Instruction;Gait training;Stair training;Functional mobility training;Therapeutic activities;Therapeutic exercise;Balance training;Neuromuscular re-education;Patient/family education;Manual techniques;Wheelchair mobility training;Energy conservation;Passive range of motion    PT Next Visit Plan  progress balance, strength, aquatic therapy    PT Home Exercise Plan  None provided at this time until further assessed, wife reports pt does not perform current HEP from [facility] PT    Consulted and Agree with Plan of Care  Patient;Family member/caregiver    Family Member Consulted  wife       Patient will benefit from skilled therapeutic intervention in order to improve the following deficits  and impairments:  Abnormal gait, Decreased balance, Decreased coordination, Decreased mobility, Impaired tone, Postural dysfunction  Visit Diagnosis: Muscle weakness (generalized)  Repeated falls  Unsteadiness on feet  Other abnormalities of gait and mobility  Parkinson's disease ([facility])  Unspecified lack of coordination  Other lack of coordination  Abnormal posture  Difficulty walking     Problem List Patient Active Problem List   Diagnosis Date Noted  . Arm injury, right, initial encounter 11/15/2016  . 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 Radicular pain in right arm 10/09/2016  . Rash 09/03/2016  . GERD (gastroesophageal reflux disease) 07/31/2016  . Trochanteric bursitis 03/03/2016  . Left hand pain 10/30/2015  . Fracture, finger, multiple sites 10/30/2015  . Colon cancer screening 07/23/2015  . Healthcare maintenance 07/23/2015  . Gout 02/18/2015  . Depression 01/06/2015  . Irritation of eyelid 08/20/2014  . Dupuytren's contracture 08/20/2014  . Cough 07/21/2014  . Lumbar stenosis with neurogenic claudication 06/13/2014  . Preop cardiovascular exam 05/28/2014  . S/P deep brain stimulator placement 05/08/2014  . Joint pain 01/22/2014  . Medicare annual wellness visit, initial 01/19/2014  . Advance care planning 01/19/2014  . Parkinson's disease ([place]) 12/13/2013  . PTSD (post-traumatic stress disorder) 06/13/2013  . Erectile dysfunction 06/13/2013  . HLD (hyperlipidemia) 06/13/2013  . Hip pain 06/10/2013  . Pain in joint, shoulder region 06/10/2013  . Right leg swelling 06/03/2013  . Essential hypertension 06/03/2013  . Bradycardia by electrocardiogram 06/03/2013  . Obstructive sleep apnea 03/12/2013  . Spinal stenosis, lumbar region, with neurogenic claudication 12/14/2012    Larae Grooms 05/02/2017, 1:54 PM  [place] MAIN [facility] SERVICES [address] [place], [place], [address] Phone: [phone]   Fax:   [phone]  Name: QUINNTIN MALTER MRN: [id number] Date of Birth: 1950/08/14

## 2017-05-03 ENCOUNTER — Ambulatory Visit (INDEPENDENT_AMBULATORY_CARE_PROVIDER_SITE_OTHER): Payer: Medicare PPO | Admitting: Family Medicine

## 2017-05-03 ENCOUNTER — Encounter: Payer: Self-pay | Admitting: Family Medicine

## 2017-05-03 VITALS — BP 138/62 | HR 60 | Temp 98.3°F | Wt 185.0 lb

## 2017-05-03 DIAGNOSIS — G20 Parkinson's disease: Secondary | ICD-10-CM | POA: Diagnosis not present

## 2017-05-03 DIAGNOSIS — M792 Neuralgia and neuritis, unspecified: Secondary | ICD-10-CM

## 2017-05-03 DIAGNOSIS — Z23 Encounter for immunization: Secondary | ICD-10-CM

## 2017-05-03 DIAGNOSIS — G4733 Obstructive sleep apnea (adult) (pediatric): Secondary | ICD-10-CM

## 2017-05-03 NOTE — Patient Instructions (Addendum)
If the other pillow doesn't help your neck then please call the spine clinic about a possible repeat injection.   Try kegel exercises in the meantime.  Take care.  Glad to see you.  Update me as needed.

## 2017-05-03 NOTE — Progress Notes (Signed)
PD with falls and occ stool incontinence. He has fallen daily recently. He has his walker and that helps.  Most of the falls were when he didn't have his walker.  dw pt about not driving.    Re: stool incontinence.  3x in the last 6 months with stool incontinence. Diet is "greasy, whatever he wants, pretty bad."  D/w pt about diet.  They are eating out a lot since they are on the road a lot with appointments.  D/w pt about incontinence pads and kegel exercises in the meantime.    More R shoulder pain, he thought he slept wrong last night.  Prev neck injection helped with pain. He has some pain radiating below the elbow.    They are picking up the CPAP machine tomorrow.   He has PT and wheelchair appointment tomorrow.   Meds, vitals, and allergies reviewed.   ROS: Per HPI unless specifically indicated in ROS section   nad ncat Flat affect at baseline Neck supple but R paraspinal muscles are tender to palpation.  Midline back not tender. rrr ctab abd soft, normal BS Ext w/o edema . His R shoulder ROM is still intact.  He does not have much pain with external and internal rotation.  Grip still intact distally.

## 2017-05-04 ENCOUNTER — Ambulatory Visit: Payer: Self-pay | Admitting: Psychology

## 2017-05-04 ENCOUNTER — Ambulatory Visit: Payer: Medicare PPO | Admitting: Physical Therapy

## 2017-05-04 DIAGNOSIS — G4733 Obstructive sleep apnea (adult) (pediatric): Secondary | ICD-10-CM | POA: Diagnosis not present

## 2017-05-04 DIAGNOSIS — G20 Parkinson's disease: Secondary | ICD-10-CM | POA: Diagnosis not present

## 2017-05-04 NOTE — Assessment & Plan Note (Signed)
I think the issue is from his neck, not from his shoulder itself.  His right shoulder range of motion is still good and he does not have an arm drop.  We talked about options.  He was going to try sleeping on a different pillow and if that does not help a lot his wife will call the spine clinic to see what options they can provide.  This is reasonable.

## 2017-05-04 NOTE — Assessment & Plan Note (Signed)
The going to get his CPAP machine set up in the near future.

## 2017-05-04 NOTE — Assessment & Plan Note (Addendum)
With multiple falls.  He is using his walker more.  Advised not to drive.  He is going to get his CPAP machine in the near future.  He is going to get a motorized wheelchair in the near future.  No change in meds at this point. Due for pneumonia vaccine, done today. Discussed stool incontinence.  Reasonable to try Kegel exercises in the GI if needed but I do not think he would be a good candidate for invasive procedures at baseline. >25 minutes spent in face to face time with patient, >50% spent in counselling or coordination of care.

## 2017-05-07 NOTE — Progress Notes (Signed)
Cardiology Office Note  Date:  05/10/2017   ID:  [name], DOB [birth year], MRN [id number]  PCP:  Tonia Ghent, MD   Chief Complaint  Patient presents with  . other    C/o chest pain and right arm pain. Meds reviewed verbally with pt.    HPI:  [name] is a pleasant 67 -year-old male year-old gentleman with  Parkinson's for more than 10 years,  Hypertension, chronic bradycardia  placement of deep brain stimulator August 2015 for tremor He receives most of his primary care followup at the [facility] Notes from there indicate history of hyperlipidemia, depression, Parkinson's, possible seizure disorder Long-standing history of heart rate 40-50 bpm and in general has been asymptomatic who presents for routine follow-up of his blood pressure and bradycardia.   Frequent falls, Hit head, One week, fell three times Uses a walker Scheduled to receive a scooter  Denies any chest pain concerning for angina Asymptomatic from his bradycardia Denies orthostasis Labile blood pressure, sometimes well-controlled, other times up to 409 systolic Chronic fatigue Napping  in the daytime  Currently not participating in exercise program Chronic low back pain  Lab work reviewed Total cho 150, LDL 90   lumbar laminectomy 12/14/2012 by Dr. Annette Stable in [place]  EKG on today's visit shows sinus bradycardia rate 49 bpm, artifact from deep brain stimulator noted, left axis deviation  Other past medical history reviewed CT scan of the head 05/29/2013 showing mild chronic inflammatory disease of the sinuses. This was done for right sided headache. Work done 05/29/2013 includes basic    PMH:   has a past medical history of Arthritis, Bradycardia, Cancer ([place]) (2013), Depression, Dysrhythmia, GERD (gastroesophageal reflux disease), Headache(784.0), History of chicken pox, History of kidney stones, Hypertension, Parkinson's disease ([place]), PTSD (post-traumatic stress disorder), Shortness  of breath dyspnea, Sleep apnea, and Varicose veins.  PSH:    Past Surgical History:  Procedure Laterality Date  . CHOLECYSTECTOMY N/A 10/22/2014   Procedure: LAPAROSCOPIC CHOLECYSTECTOMY WITH INTRAOPERATIVE CHOLANGIOGRAM;  Surgeon: Dia Crawford III, MD;  Location: [facility] ORS;  Service: General;  Laterality: N/A;  . cyst removed      from lip as a child  . LUMBAR LAMINECTOMY/DECOMPRESSION MICRODISCECTOMY Bilateral 12/14/2012   Procedure: Bilateral lumbar three-four, four-five decompressive laminotomy/foraminotomy;  Surgeon: Charlie Pitter, MD;  Location: [place] NEURO ORS;  Service: Neurosurgery;  Laterality: Bilateral;  . PULSE GENERATOR IMPLANT Bilateral 12/13/2013   Procedure: Bilateral implantable pulse generator placement;  Surgeon: Erline Levine, MD;  Location: [place] NEURO ORS;  Service: Neurosurgery;  Laterality: Bilateral;  Bilateral implantable pulse generator placement  . skin cancer removed     from ears,   12 lft arm  rt leg 15  . SUBTHALAMIC STIMULATOR INSERTION Bilateral 12/06/2013   Procedure: SUBTHALAMIC STIMULATOR INSERTION;  Surgeon: Erline Levine, MD;  Location: [place] NEURO ORS;  Service: Neurosurgery;  Laterality: Bilateral;  Bilateral deep brain stimulator placement    Current Outpatient Medications  Medication Sig Dispense Refill  . buPROPion (WELLBUTRIN XL) 150 MG 24 hr tablet Take 3 tablets (450 mg total) by mouth daily. 270 tablet 3  . carbidopa-levodopa (SINEMET IR) 25-100 MG tablet Take 1 tablet by mouth See admin instructions. 2 AM/ 1 afternoon/ 1 evening (Patient taking differently: Take two by mouth every morning and one in the pm) 120 tablet 5  . cholecalciferol (VITAMIN D) 1000 UNITS tablet Take 1,000 Units by mouth daily.    Marland Kitchen escitalopram (LEXAPRO) 10 MG tablet Take 1 tablet (10 mg total)  by mouth daily. 90 tablet 3  . hydrOXYzine (ATARAX/VISTARIL) 10 MG tablet Take 10 mg by mouth at bedtime.    . memantine (NAMENDA) 10 MG tablet TAKE 1 TABLET TWICE A DAY 180 tablet 0  . naproxen  (NAPROSYN) 500 MG tablet Take 500 mg by mouth as needed.     . NONFORMULARY OR COMPOUNDED ITEM Apply 1 application topically daily. Triamcinolone .25%/Cerave 1:1    . omeprazole (PRILOSEC) 20 MG capsule TAKE 1 CAPSULE DAILY 90 capsule 1  . pravastatin (PRAVACHOL) 20 MG tablet Take 1 tablet (20 mg total) by mouth every evening. 90 tablet 3   No current facility-administered medications for this visit.      Allergies:   Klonopin [clonazepam]; Morphine and related; and Prednisone   Social History:  The patient  reports that  has never smoked. He has quit using smokeless tobacco. He reports that he drinks alcohol. He reports that he does not use drugs.   Family History:   family history includes Lung cancer in his father.    Review of Systems: Review of Systems  Constitutional: Positive for malaise/fatigue.  Respiratory: Negative.   Cardiovascular: Negative.   Gastrointestinal: Negative.   Musculoskeletal: Positive for falls.       Unsteady gait  Neurological: Negative.   Psychiatric/Behavioral: Negative.   All other systems reviewed and are negative.    PHYSICAL EXAM: VS:  BP (!) 146/80 (BP Location: Left Arm, Patient Position: Sitting, Cuff Size: Normal)   Pulse (!) 49   Ht 5\' 9"  (1.753 m)   Wt 192 lb 8 oz (87.3 kg)   BMI 28.43 kg/m  , BMI Body mass index is 28.43 kg/m. GEN: Well nourished, well developed, in no acute distress, slow with movements from chair to table  HEENT: normal  Neck: no JVD, carotid bruits, or masses Cardiac:Regular rhythm, bradycardic; no murmurs, rubs, or gallops,no edema  Respiratory:  clear to auscultation bilaterally, normal work of breathing GI: soft, nontender, nondistended, + BS MS: no deformity or atrophy  Skin: warm and dry, no rash Neuro:  Strength and sensation are intact Psych: euthymic mood, minimally conversant    Recent Labs: 06/04/2016: Magnesium 2.2 10/13/2016: ALT 16; BUN 21; Creatinine, Ser 0.93; Hemoglobin 13.9; Platelets  175; Potassium 4.3; Sodium 140    Lipid Panel Lab Results  Component Value Date   CHOL 151 07/25/2016   HDL 36.20 (L) 07/25/2016   LDLCALC 92 07/25/2016   TRIG 117.0 07/25/2016      Wt Readings from Last 3 Encounters:  05/10/17 192 lb 8 oz (87.3 kg)  05/03/17 185 lb (83.9 kg)  04/24/17 188 lb (85.3 kg)       ASSESSMENT AND PLAN:  Mixed hyperlipidemia  Cholesterol is at goal on the current lipid regimen. No changes to the medications were made.  Essential hypertension - Blood pressure is well controlled on today's visit. No changes made to the medications.  Spinal stenosis, lumbar region, with neurogenic claudication -  Chronic back pain Stable  S/P deep brain stimulator placement - Reports dramatically improved tremor  Bradycardia Asymptomatic, adequate blood pressure No indication for pacemaker Falls likely mechanical not from bradycardia  Total encounter time more than 25 minutes Greater than 50% was spent in counseling and coordination of care with the patient  Disposition:   F/U  12 months   Orders Placed This Encounter  Procedures  . EKG 12-Lead     Signed, Esmond Plants, M.D., Ph.D. 05/10/2017  [place]  [phone]  

## 2017-05-09 ENCOUNTER — Encounter: Payer: Self-pay | Admitting: Physical Therapy

## 2017-05-09 ENCOUNTER — Ambulatory Visit: Payer: Medicare PPO | Admitting: Physical Therapy

## 2017-05-09 ENCOUNTER — Ambulatory Visit: Payer: Self-pay

## 2017-05-09 DIAGNOSIS — R296 Repeated falls: Secondary | ICD-10-CM

## 2017-05-09 DIAGNOSIS — R2681 Unsteadiness on feet: Secondary | ICD-10-CM

## 2017-05-09 DIAGNOSIS — R2689 Other abnormalities of gait and mobility: Secondary | ICD-10-CM | POA: Diagnosis not present

## 2017-05-09 DIAGNOSIS — R279 Unspecified lack of coordination: Secondary | ICD-10-CM

## 2017-05-09 DIAGNOSIS — M6281 Muscle weakness (generalized): Secondary | ICD-10-CM | POA: Diagnosis not present

## 2017-05-09 DIAGNOSIS — G20 Parkinson's disease: Secondary | ICD-10-CM

## 2017-05-09 DIAGNOSIS — R278 Other lack of coordination: Secondary | ICD-10-CM | POA: Diagnosis not present

## 2017-05-09 DIAGNOSIS — R262 Difficulty in walking, not elsewhere classified: Secondary | ICD-10-CM | POA: Diagnosis not present

## 2017-05-09 DIAGNOSIS — R293 Abnormal posture: Secondary | ICD-10-CM | POA: Diagnosis not present

## 2017-05-09 NOTE — Therapy (Signed)
[place] MAIN [facility] SERVICES [address] [place], [place], [address] Phone: [phone]   Fax:  [phone]  Physical Therapy Treatment  Patient Details  Name: [name] MRN: [id number] Date of Birth: 12-02-1950 Referring Provider: Alonza Bogus, DO   Encounter Date: 05/09/2017  PT End of Session - 05/09/17 0917    Visit Number  17    Number of Visits  33    Date for PT Re-Evaluation  05/30/17    PT Start Time  0855    PT Stop Time  0933    PT Time Calculation (min)  38 min    Activity Tolerance  Patient tolerated treatment well    Behavior During Therapy  [facility] for tasks assessed/performed       Past Medical History:  Diagnosis Date  . Arthritis   . Bradycardia   . Cancer [facility]) 2013   skin cancer  . Depression    ptsd  . Dysrhythmia    chronic slow heart rate  . GERD (gastroesophageal reflux disease)   . Headache(784.0)    tension headaches non recent  . History of chicken pox   . History of kidney stones   . Hypertension    treated with HCTZ  . Parkinson's disease ([place])    dx'ed 15 years ago  . PTSD (post-traumatic stress disorder)   . Shortness of breath dyspnea   . Sleep apnea    doesn't use C-pap  . Varicose veins     Past Surgical History:  Procedure Laterality Date  . CHOLECYSTECTOMY N/A 10/22/2014   Procedure: LAPAROSCOPIC CHOLECYSTECTOMY WITH INTRAOPERATIVE CHOLANGIOGRAM;  Surgeon: Dia Crawford III, MD;  Location: [facility] ORS;  Service: General;  Laterality: N/A;  . cyst removed      from lip as a child  . LUMBAR LAMINECTOMY/DECOMPRESSION MICRODISCECTOMY Bilateral 12/14/2012   Procedure: Bilateral lumbar three-four, four-five decompressive laminotomy/foraminotomy;  Surgeon: Charlie Pitter, MD;  Location: [place] NEURO ORS;  Service: Neurosurgery;  Laterality: Bilateral;  . PULSE GENERATOR IMPLANT Bilateral 12/13/2013   Procedure: Bilateral implantable pulse generator placement;  Surgeon: Erline Levine, MD;  Location: [place]  NEURO ORS;  Service: Neurosurgery;  Laterality: Bilateral;  Bilateral implantable pulse generator placement  . skin cancer removed     from ears,   12 lft arm  rt leg 15  . SUBTHALAMIC STIMULATOR INSERTION Bilateral 12/06/2013   Procedure: SUBTHALAMIC STIMULATOR INSERTION;  Surgeon: Erline Levine, MD;  Location: [place] NEURO ORS;  Service: Neurosurgery;  Laterality: Bilateral;  Bilateral deep brain stimulator placement    There were no vitals filed for this visit.  Subjective Assessment - 05/09/17 0911    Subjective  Patient fell 2 times in the same day into the tub, and hurt his R shoulder.  His neck is hurting today 8/10.    Patient is accompained by:  Family member    Pertinent History  Patient referred for physical therapy due to wife's request for patient to get aquatic therapy to help with his conditioning. Pt is also complaining of chronic low back and R hip pain and frequent falls. 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 The last time he was in the water he was very uncomfortable because he didn't feel like he had any control. Pt has a history of 2 low back surgeries in August 2014 and February 2016. Pt and wife are unable to recall exactly what was performed but they  know that he had stenosis. Surgeries were performed by Dr. Annette Stable in [place]. Pt reports that his low back and R hip pain are constant. He describes the pain in the back as "dull" and the pain in the hip as "sharp." For both back and hip worst pain is 10/10, Best: 2/10, Present: 2/10. No particular episode of trauma to his low back or hip preceding the pain. Pt has a history of Parkinson's with a deep brain stimulator. He ambulates with a 4 wheeled walker called a U-step (automatic brakes, audio cues for stepping). He states that he falls frequently and reports that he has had approximately 40-50 falls in the last 6 months. No specific recent changes in his health.      Limitations  Walking;Standing;Sitting    How long can you sit comfortably?  30 minutes    How long can you stand comfortably?  5 minutes    How long can you walk comfortably?  10 minutes    Patient Stated Goals  Improve comfort in the water, improve balance, decrease back and R hip pain    Currently in Pain?  Yes    Pain Score  8     Pain Location  Neck    Pain Descriptors / Indicators  Aching;Tender;Grimacing    Pain Type  Chronic pain    Pain Onset  In the past 7 days    Pain Frequency  Constant    Aggravating Factors   moving    Pain Relieving Factors  shots at MD    Multiple Pain Sites  -- R shoulder 8/10    Pain Onset  More than a month ago       Therapeutic exercise;  Patient performs tandem step position and rocking position with BUE alternating arm swing; patient needs cues to have full weight shifting over each leg fwd and back, VC to swing UE but is not able to coordinate his UE with his weight shifting. Patient needs education to break down the exercise into parts, 1. Using BUE on parallel bars shift weight fwd and back, then use one UE for support , then try no UE support with right leg in front  Stepping fwd and stepping backward with the right foot with BUE support x 10   Stepping fwd and stepping back ward with the left foot with BUe support x 10  Stepping side ways left x  10 , side stepping side ways right x 10 , UE support   Stepping backward and then back to beginning position left foot x 10 , right foot x 10 with UE support; patient has fatigue in B hips and needs to rest  Standing in tandem stepping with a 12 inch space and rocking over his feet fwd and bwd x 10   Supine bridging x 10 x 2  sidelying hip abd x 10 x 2 BLE;  Min assist and cues to keep his leg from creeping fwd   SLR x 10 x 2 with cues for keeping LE knee straight.   CGA and Min to mod verbal cues used throughout with increased in postural sway and LOB most seen with narrow base of  support and while on uneven surfaces. Continues to have balance deficits typical with diagnosis. Patient performs beginning level exercises  and needs verbal cuing for postural alignment and head positioning                       PT Education -  05/09/17 0916    Education provided  Yes    Education Details  HEP    Person(s) Educated  Patient    Methods  Explanation    Comprehension  Verbalized understanding       PT Short Term Goals - 05/02/17 1353      PT SHORT TERM GOAL #1   Title  Pt will perform HEP with family's supervision, for improved balance, transfers, and gait.      Baseline  becoming more compliant but not every day     Time  4    Period  Weeks    Status  Partially Met      PT SHORT TERM GOAL #2   Title  Pt will perform 5x sit<>stand in less than or equal to 15 seconds for improved efficiency and safety with transfers.    Baseline  17 seconds    Time  2    Period  Weeks    Status  New        PT Long Term Goals - 05/02/17 1353      PT LONG TERM GOAL #1   Title  Pt will decrease 5TSTS by at least 3 seconds in order to demonstrate clinically significant improvement in LE strength       PT LONG TERM GOAL #2   Title  Pt will decrease TUG by at least 3.4s in order to demonstrate decreased fall risk       PT LONG TERM GOAL #3   Title  Pt will improve BERG by at least 3 points in order to demonstrate clinically significant improvement in balance.      PT LONG TERM GOAL #4   Title  Pt/wife will verbalize understanding of plans for continued community fitness upon D/C from PT including aquatic therapy program      PT LONG TERM GOAL #5   Title  Pt will decrease mODI scoreby at least 13 points in order demonstrate clinically significant reduction in pain/disability       PT LONG TERM GOAL #6   Title  Pt will improve BERG to 44/56  in order to demonstrate clinically significant improvement in balance for decreased fall risk            Plan -  05/09/17 0921    Clinical Impression Statement  Patient demonstrates decreased static and dynamic standing balance and requires verbal and tactile cueing and min assist  to maintain center of gravity during dynamic balance activities. Patient also requires CGA during all dynamic standing balance activities and UE support with parallel bars.  Patient was able to perform beginning and intermediate strengthening in closed and open chain without pain behaviors.  Patient requires consistent cueing to maintain correct position during balance activities.  Patient demonstrates difficulty with static standing balance and increased postural sway while on floor with LE movements. Patient will continue to benefit from skilled therapy in order to improve static and dynamic standing balance and increase endurance.    Rehab Potential  Fair    PT Frequency  2x / week    PT Duration  8 weeks    PT Treatment/Interventions  ADLs/Self Care Home Management;Aquatic Therapy;Cryotherapy;Electrical Stimulation;Iontophoresis 8m/ml Dexamethasone;Moist Heat;Ultrasound;Contrast Bath;DME Instruction;Gait training;Stair training;Functional mobility training;Therapeutic activities;Therapeutic exercise;Balance training;Neuromuscular re-education;Patient/family education;Manual techniques;Wheelchair mobility training;Energy conservation;Passive range of motion    PT Next Visit Plan  progress balance, strength, aquatic therapy    PT Home Exercise Plan  None provided at this time until further assessed, wife reports pt  does not perform current HEP from [facility] PT    Consulted and Agree with Plan of Care  Patient;Family member/caregiver    Family Member Consulted  wife       Patient will benefit from skilled therapeutic intervention in order to improve the following deficits and impairments:  Abnormal gait, Decreased balance, Decreased coordination, Decreased mobility, Impaired tone, Postural dysfunction  Visit Diagnosis: Muscle weakness  (generalized)  Repeated falls  Unsteadiness on feet  Other abnormalities of gait and mobility  Parkinson's disease ([place])  Unspecified lack of coordination     Problem List Patient Active Problem List   Diagnosis Date Noted  . 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 Radicular pain in right arm 10/09/2016  . Rash 09/03/2016  . GERD (gastroesophageal reflux disease) 07/31/2016  . Trochanteric bursitis 03/03/2016  . Left hand pain 10/30/2015  . Fracture, finger, multiple sites 10/30/2015  . Colon cancer screening 07/23/2015  . Healthcare maintenance 07/23/2015  . Gout 02/18/2015  . Depression 01/06/2015  . Irritation of eyelid 08/20/2014  . Dupuytren's contracture 08/20/2014  . Cough 07/21/2014  . Lumbar stenosis with neurogenic claudication 06/13/2014  . Preop cardiovascular exam 05/28/2014  . S/P deep brain stimulator placement 05/08/2014  . Joint pain 01/22/2014  . Medicare annual wellness visit, initial 01/19/2014  . Advance care planning 01/19/2014  . Parkinson's disease ([place]) 12/13/2013  . PTSD (post-traumatic stress disorder) 06/13/2013  . Erectile dysfunction 06/13/2013  . HLD (hyperlipidemia) 06/13/2013  . Hip pain 06/10/2013  . Pain in joint, shoulder region 06/10/2013  . Right leg swelling 06/03/2013  . Essential hypertension 06/03/2013  . Bradycardia by electrocardiogram 06/03/2013  . Obstructive sleep apnea 03/12/2013  . Spinal stenosis, lumbar region, with neurogenic claudication 12/14/2012    Alanson Puls, [place] DPT 05/09/2017, 9:26 AM  [place] MAIN [facility] SERVICES [address] [place], [place], [address] Phone: [phone]   Fax:  [phone]  Name: CHIKE FARRINGTON MRN: [id number] Date of Birth: 1951/02/24

## 2017-05-10 ENCOUNTER — Ambulatory Visit (INDEPENDENT_AMBULATORY_CARE_PROVIDER_SITE_OTHER): Payer: Medicare PPO | Admitting: Cardiovascular Disease

## 2017-05-10 ENCOUNTER — Encounter: Payer: Self-pay | Admitting: Cardiovascular Disease

## 2017-05-10 VITALS — BP 146/80 | HR 49 | Ht 69.0 in | Wt 192.5 lb

## 2017-05-10 DIAGNOSIS — E782 Mixed hyperlipidemia: Secondary | ICD-10-CM

## 2017-05-10 DIAGNOSIS — M48062 Spinal stenosis, lumbar region with neurogenic claudication: Secondary | ICD-10-CM | POA: Diagnosis not present

## 2017-05-10 DIAGNOSIS — I10 Essential (primary) hypertension: Secondary | ICD-10-CM | POA: Diagnosis not present

## 2017-05-10 DIAGNOSIS — I471 Supraventricular tachycardia: Secondary | ICD-10-CM

## 2017-05-10 DIAGNOSIS — G20 Parkinson's disease: Secondary | ICD-10-CM

## 2017-05-10 DIAGNOSIS — I4719 Other supraventricular tachycardia: Secondary | ICD-10-CM

## 2017-05-10 DIAGNOSIS — G20A1 Parkinson's disease without dyskinesia, without mention of fluctuations: Secondary | ICD-10-CM

## 2017-05-10 DIAGNOSIS — R001 Bradycardia, unspecified: Secondary | ICD-10-CM | POA: Diagnosis not present

## 2017-05-10 NOTE — Patient Instructions (Signed)
Medication Instructions:   No medication changes made  Labwork:  No new labs needed  Testing/Procedures:  No further testing at this time   Follow-Up: It was a pleasure seeing you in the office today. Please call us if you have new issues that need to be addressed before your next appt.  [phone]  Your physician wants you to follow-up in: 12 months.  You will receive a reminder letter in the mail two months in advance. If you don't receive a letter, please call our office to schedule the follow-up appointment.  If you need a refill on your cardiac medications before your next appointment, please call your pharmacy.     

## 2017-05-11 ENCOUNTER — Ambulatory Visit: Payer: Medicare PPO

## 2017-05-11 ENCOUNTER — Other Ambulatory Visit: Payer: Self-pay | Admitting: Neurology

## 2017-05-11 ENCOUNTER — Other Ambulatory Visit: Payer: Self-pay

## 2017-05-11 DIAGNOSIS — M6281 Muscle weakness (generalized): Secondary | ICD-10-CM

## 2017-05-11 DIAGNOSIS — R262 Difficulty in walking, not elsewhere classified: Secondary | ICD-10-CM

## 2017-05-11 DIAGNOSIS — R279 Unspecified lack of coordination: Secondary | ICD-10-CM | POA: Diagnosis not present

## 2017-05-11 DIAGNOSIS — R296 Repeated falls: Secondary | ICD-10-CM | POA: Diagnosis not present

## 2017-05-11 DIAGNOSIS — R293 Abnormal posture: Secondary | ICD-10-CM

## 2017-05-11 DIAGNOSIS — R2689 Other abnormalities of gait and mobility: Secondary | ICD-10-CM | POA: Diagnosis not present

## 2017-05-11 DIAGNOSIS — R278 Other lack of coordination: Secondary | ICD-10-CM

## 2017-05-11 DIAGNOSIS — M545 Low back pain, unspecified: Secondary | ICD-10-CM

## 2017-05-11 DIAGNOSIS — G20A1 Parkinson's disease without dyskinesia, without mention of fluctuations: Secondary | ICD-10-CM

## 2017-05-11 DIAGNOSIS — G20 Parkinson's disease: Secondary | ICD-10-CM

## 2017-05-11 DIAGNOSIS — R2681 Unsteadiness on feet: Secondary | ICD-10-CM | POA: Diagnosis not present

## 2017-05-11 DIAGNOSIS — G8929 Other chronic pain: Secondary | ICD-10-CM

## 2017-05-11 DIAGNOSIS — M25551 Pain in right hip: Secondary | ICD-10-CM

## 2017-05-11 NOTE — Therapy (Signed)
[place] MAIN [facility] SERVICES [address] [place], [place], [address] Phone: [phone]   Fax:  [phone]  Physical Therapy Treatment  Patient Details  Name: [name] MRN: [id number] Date of Birth: June 12, 1950 Referring Provider: Alonza Bogus, DO   Encounter Date: 05/11/2017  PT End of Session - 05/11/17 1343    Visit Number  18    Number of Visits  33    Date for PT Re-Evaluation  05/30/17    PT Start Time  0945    PT Stop Time  1050    PT Time Calculation (min)  65 min    Activity Tolerance  Patient tolerated treatment well    Behavior During Therapy  [facility] for tasks assessed/performed       Past Medical History:  Diagnosis Date  . Arthritis   . Bradycardia   . Cancer [facility]) 2013   skin cancer  . Depression    ptsd  . Dysrhythmia    chronic slow heart rate  . GERD (gastroesophageal reflux disease)   . Headache(784.0)    tension headaches non recent  . History of chicken pox   . History of kidney stones   . Hypertension    treated with HCTZ  . Parkinson's disease ([facility])    dx'ed 15 years ago  . PTSD (post-traumatic stress disorder)   . Shortness of breath dyspnea   . Sleep apnea    doesn't use C-pap  . Varicose veins     Past Surgical History:  Procedure Laterality Date  . CHOLECYSTECTOMY N/A 10/22/2014   Procedure: LAPAROSCOPIC CHOLECYSTECTOMY WITH INTRAOPERATIVE CHOLANGIOGRAM;  Surgeon: Dia Crawford III, MD;  Location: [facility] ORS;  Service: General;  Laterality: N/A;  . cyst removed      from lip as a child  . LUMBAR LAMINECTOMY/DECOMPRESSION MICRODISCECTOMY Bilateral 12/14/2012   Procedure: Bilateral lumbar three-four, four-five decompressive laminotomy/foraminotomy;  Surgeon: Charlie Pitter, MD;  Location: [place] NEURO ORS;  Service: Neurosurgery;  Laterality: Bilateral;  . PULSE GENERATOR IMPLANT Bilateral 12/13/2013   Procedure: Bilateral implantable pulse generator placement;  Surgeon: Erline Levine, MD;  Location: [place]  NEURO ORS;  Service: Neurosurgery;  Laterality: Bilateral;  Bilateral implantable pulse generator placement  . skin cancer removed     from ears,   12 lft arm  rt leg 15  . SUBTHALAMIC STIMULATOR INSERTION Bilateral 12/06/2013   Procedure: SUBTHALAMIC STIMULATOR INSERTION;  Surgeon: Erline Levine, MD;  Location: [place] NEURO ORS;  Service: Neurosurgery;  Laterality: Bilateral;  Bilateral deep brain stimulator placement    There were no vitals filed for this visit.  Subjective Assessment - 05/11/17 1336    Subjective  Pt notes 2 "major falls" and several "minor ones that don't count", according to pt. Discussed how/why falls happened. Pt notes he was not using tub bench, but sitting on the edge of the tub instead. The other major fall was in a tighter space without rollator. Pt notes some moderate soreness on R side of body/shoulder.     Patient is accompained by:  Family member    Pertinent History  Patient referred for physical therapy due to wife's request for patient to get aquatic therapy to help with his conditioning. Pt is also complaining of chronic low back and R hip pain and frequent falls. 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 The last time he was in the water he was very uncomfortable because he  didn't feel like he had any control. Pt has a history of 2 low back surgeries in August 2014 and February 2016. Pt and wife are unable to recall exactly what was performed but they know that he had stenosis. Surgeries were performed by Dr. Annette Stable in [place]. Pt reports that his low back and R hip pain are constant. He describes the pain in the back as "dull" and the pain in the hip as "sharp." For both back and hip worst pain is 10/10, Best: 2/10, Present: 2/10. No particular episode of trauma to his low back or hip preceding the pain. Pt has a history of Parkinson's with a deep brain stimulator. He ambulates with a 4 wheeled walker called a  U-step (automatic brakes, audio cues for stepping). He states that he falls frequently and reports that he has had approximately 40-50 falls in the last 6 months. No specific recent changes in his health.       Enters/exits pool via ramp Participates in the following.   Ambulation - 4 L fwd - 4 L side step with squat  Balance/strength - Step side lunges/return to upright stance, no UE support 30x B - Static stand (hip width) perturbations, F/B and side, 5 min - Static stand (hip width) isometric resisted turns, 5 min - Active trunk rotation with ball grab and give, 25x - STS at bench, no UE support and hands together at chest, 30x  Supine float on noodles to relax rigidity with passive trunk sway, 4 L  Swimming, supine on noodles - backstroke arms, 4 L - backstroke flutter, 4 L                            PT Education - 05/11/17 1340    Education provided  Yes    Education Details  Re educated on safety by using appropriate AD ro equipment to avoid falls as much as possible. Pt notes losing balance to the R a lot; educated on increasing aquatic exercises that work righting reactions especially side to side. Pt does often lose balance posteriorly as well and will conitnue strengthening core/balance globally.     Person(s) Educated  Patient;Spouse    Methods  Explanation;Demonstration       PT Short Term Goals - 05/11/17 1353      PT SHORT TERM GOAL #1   Title  Pt will perform HEP with family's supervision, for improved balance, transfers, and gait.      Baseline  becoming more compliant but not every day     Time  4    Period  Weeks    Status  Partially Met      PT SHORT TERM GOAL #2   Title  Pt will perform 5x sit<>stand in less than or equal to 15 seconds for improved efficiency and safety with transfers.    Baseline  17 seconds    Time  2    Period  Weeks    Status  New        PT Long Term Goals - 05/11/17 1353      PT LONG TERM GOAL #1    Title  Pt will decrease 5TSTS by at least 3 seconds in order to demonstrate clinically significant improvement in LE strength       PT LONG TERM GOAL #2   Title  Pt will decrease TUG by at least 3.4s in order to demonstrate decreased fall risk  PT LONG TERM GOAL #3   Title  Pt will improve BERG by at least 3 points in order to demonstrate clinically significant improvement in balance.      PT LONG TERM GOAL #4   Title  Pt/wife will verbalize understanding of plans for continued community fitness upon D/C from PT including aquatic therapy program      PT LONG TERM GOAL #5   Title  Pt will decrease mODI scoreby at least 13 points in order demonstrate clinically significant reduction in pain/disability       PT LONG TERM GOAL #6   Title  Pt will improve BERG to 44/56  in order to demonstrate clinically significant improvement in balance for decreased fall risk            Plan - 05/11/17 1344    Clinical Impression Statement  Tolerates increased lateral work through side lunging ontol step with push off to upright stance with no UE requiring Min A at times; cues for R trunk in neutral versus flexed and rotated frwd. Tolerates moderate perturbances with hip width stance with occastional LOB requiring Mn A to regain balance. Side ambulation with squat continues mildly challenging with verbal cues. Overall progressing balance activitties., strength and coordination. Pt able to perform flutter kick in supine with floats for the first time this session.     Rehab Potential  Fair    PT Frequency  2x / week    PT Duration  8 weeks    PT Treatment/Interventions  ADLs/Self Care Home Management;Aquatic Therapy;Cryotherapy;Electrical Stimulation;Iontophoresis 41m/ml Dexamethasone;Moist Heat;Ultrasound;Contrast Bath;DME Instruction;Gait training;Stair training;Functional mobility training;Therapeutic activities;Therapeutic exercise;Balance training;Neuromuscular re-education;Patient/family  education;Manual techniques;Wheelchair mobility training;Energy conservation;Passive range of motion    PT Next Visit Plan  progress balance, strength, aquatic therapy    PT Home Exercise Plan  None provided at this time until further assessed, wife reports [facility]    Consulted and Agree with Plan of Care  Patient;Family member/caregiver    Family Member Consulted  wife       Patient will benefit from skilled therapeutic intervention in order to improve the following deficits and impairments:  Abnormal gait, Decreased balance, Decreased coordination, Decreased mobility, Impaired tone, Postural dysfunction  Visit Diagnosis: Muscle weakness (generalized)  Repeated falls  Unsteadiness on feet  Other abnormalities of gait and mobility  Parkinson's disease ([facility])  Unspecified lack of coordination  Other lack of coordination  Abnormal posture  Difficulty walking  Chronic midline low back pain without sciatica  Pain in right hip     Problem List Patient Active Problem List   Diagnosis Date Noted  . 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 Radicular pain in right arm 10/09/2016  . Rash 09/03/2016  . GERD (gastroesophageal reflux disease) 07/31/2016  . Trochanteric bursitis 03/03/2016  . Left hand pain 10/30/2015  . Fracture, finger, multiple sites 10/30/2015  . Colon cancer screening 07/23/2015  . Healthcare maintenance 07/23/2015  . Gout 02/18/2015  . Depression 01/06/2015  . Irritation of eyelid 08/20/2014  . Dupuytren's contracture 08/20/2014  . Cough 07/21/2014  . Lumbar stenosis with neurogenic claudication 06/13/2014  . Preop cardiovascular exam 05/28/2014  . S/P deep brain stimulator placement 05/08/2014  . Joint pain 01/22/2014  . Medicare annual wellness visit, initial 01/19/2014  . Advance care planning 01/19/2014  . [place]'s disease ([place] 12/13/2013  . PTSD (post-traumatic stress disorder) 06/13/2013  . Erectile  dysfunction 06/13/2013  . HLD (hyperlipidemia) 06/13/2013  . Hip pain 06/10/2013  .  Pain in joint, shoulder region 06/10/2013  . Right leg swelling 06/03/2013  . Essential hypertension 06/03/2013  . Bradycardia by electrocardiogram 06/03/2013  . Obstructive sleep apnea 03/12/2013  . Spinal stenosis, lumbar region, with neurogenic claudication 12/14/2012    [name] 05/11/2017, 1:55 PM  [place] MAIN [facility] SERVICES [address] [place], [place], [address] Phone: [phone]   Fax:  [phone]  Name: [name] MRN: [id number] Date of Birth: Nov 04, 1950

## 2017-05-16 ENCOUNTER — Ambulatory Visit: Payer: Medicare PPO

## 2017-05-16 ENCOUNTER — Ambulatory Visit: Payer: Medicare PPO | Admitting: Psychology

## 2017-05-16 VITALS — BP 144/70 | HR 46

## 2017-05-16 DIAGNOSIS — M6281 Muscle weakness (generalized): Secondary | ICD-10-CM | POA: Diagnosis not present

## 2017-05-16 DIAGNOSIS — G20 Parkinson's disease: Secondary | ICD-10-CM | POA: Diagnosis not present

## 2017-05-16 DIAGNOSIS — R279 Unspecified lack of coordination: Secondary | ICD-10-CM | POA: Diagnosis not present

## 2017-05-16 DIAGNOSIS — R262 Difficulty in walking, not elsewhere classified: Secondary | ICD-10-CM | POA: Diagnosis not present

## 2017-05-16 DIAGNOSIS — R296 Repeated falls: Secondary | ICD-10-CM | POA: Diagnosis not present

## 2017-05-16 DIAGNOSIS — R2681 Unsteadiness on feet: Secondary | ICD-10-CM

## 2017-05-16 DIAGNOSIS — R278 Other lack of coordination: Secondary | ICD-10-CM | POA: Diagnosis not present

## 2017-05-16 DIAGNOSIS — R2689 Other abnormalities of gait and mobility: Secondary | ICD-10-CM

## 2017-05-16 DIAGNOSIS — R293 Abnormal posture: Secondary | ICD-10-CM | POA: Diagnosis not present

## 2017-05-16 NOTE — Therapy (Signed)
[place] MAIN [facility] SERVICES [address] [place], [place], [address] Phone: [phone]   Fax:  [phone]  Physical Therapy Treatment  Patient Details  Name: [name] MRN: [id number] Date of Birth: [birth year] Referring Provider: Alonza Bogus, DO   Encounter Date: 05/16/2017  PT End of Session - 05/16/17 0939    Visit Number  19    Number of Visits  33    Date for PT Re-Evaluation  05/30/17    PT Start Time  0930    PT Stop Time  9449    PT Time Calculation (min)  45 min    Activity Tolerance  Patient tolerated treatment well    Behavior During Therapy  [facility] for tasks assessed/performed       Past Medical History:  Diagnosis Date  . Arthritis   . Bradycardia   . Cancer [facility]) 2013   skin cancer  . Depression    ptsd  . Dysrhythmia    chronic slow heart rate  . GERD (gastroesophageal reflux disease)   . Headache(784.0)    tension headaches non recent  . History of chicken pox   . History of kidney stones   . Hypertension    treated with HCTZ  . Parkinson's disease ([place])    dx'ed 15 years ago  . PTSD (post-traumatic stress disorder)   . Shortness of breath dyspnea   . Sleep apnea    doesn't use C-pap  . Varicose veins     Past Surgical History:  Procedure Laterality Date  . CHOLECYSTECTOMY N/A 10/22/2014   Procedure: LAPAROSCOPIC CHOLECYSTECTOMY WITH INTRAOPERATIVE CHOLANGIOGRAM;  Surgeon: Dia Crawford III, MD;  Location: [facility] ORS;  Service: General;  Laterality: N/A;  . cyst removed      from lip as a child  . LUMBAR LAMINECTOMY/DECOMPRESSION MICRODISCECTOMY Bilateral 12/14/2012   Procedure: Bilateral lumbar three-four, four-five decompressive laminotomy/foraminotomy;  Surgeon: Charlie Pitter, MD;  Location: [place] NEURO ORS;  Service: Neurosurgery;  Laterality: Bilateral;  . PULSE GENERATOR IMPLANT Bilateral 12/13/2013   Procedure: Bilateral implantable pulse generator placement;  Surgeon: Erline Levine, MD;  Location: [place]  NEURO ORS;  Service: Neurosurgery;  Laterality: Bilateral;  Bilateral implantable pulse generator placement  . skin cancer removed     from ears,   12 lft arm  rt leg 15  . SUBTHALAMIC STIMULATOR INSERTION Bilateral 12/06/2013   Procedure: SUBTHALAMIC STIMULATOR INSERTION;  Surgeon: Erline Levine, MD;  Location: [place] NEURO ORS;  Service: Neurosurgery;  Laterality: Bilateral;  Bilateral deep brain stimulator placement    Vitals:   05/16/17 0947  BP: (!) 144/70  Pulse: (!) 46    144/70 pulse (48) Subjective Assessment - 05/16/17 0934    Subjective  Patient reports a couple "minor" falls. R elbow and shoulder still aching from "big" fall last week.  Patient having increased confusion. Will get a cortisone shot tomorrow  in neck and shoulder.  Friend took him to therapy today.      Patient is accompained by:  Family member    Pertinent History  Patient referred for physical therapy due to wife's request for patient to get aquatic therapy to help with his conditioning. Pt is also complaining of chronic low back and R hip pain and frequent falls. 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 The last time he was in the water he was very uncomfortable because he didn't feel like he had any  control. Pt has a history of 2 low back surgeries in August 2014 and February 2016. Pt and wife are unable to recall exactly what was performed but they know that he had stenosis. Surgeries were performed by Dr. Annette Stable in [place]. Pt reports that his low back and R hip pain are constant. He describes the pain in the back as "dull" and the pain in the hip as "sharp." For both back and hip worst pain is 10/10, Best: 2/10, Present: 2/10. No particular episode of trauma to his low back or hip preceding the pain. Pt has a history of Parkinson's with a deep brain stimulator. He ambulates with a 4 wheeled walker called a U-step (automatic brakes, audio cues for stepping). He  states that he falls frequently and reports that he has had approximately 40-50 falls in the last 6 months. No specific recent changes in his health.     Limitations  Walking;Standing;Sitting    How long can you sit comfortably?  30 minutes    How long can you stand comfortably?  5 minutes    How long can you walk comfortably?  10 minutes    Patient Stated Goals  Improve comfort in the water, improve balance, decrease back and R hip pain    Currently in Pain?  Yes    Pain Score  6     Pain Location  Neck    Pain Orientation  Left    Pain Descriptors / Indicators  Aching;Tender    Pain Type  Chronic pain    Pain Onset  1 to 4 weeks ago    Pain Frequency  Constant    Pain Score  6    Pain Location  Back    Pain Orientation  Lower    Pain Descriptors / Indicators  Stabbing;Tender    Pain Type  Chronic pain    Pain Onset  1 to 4 weeks ago    Pain Frequency  Constant        Nustep Lvl 3 4 minutes  Transfer practice:  SPT with walker and CGA from chair to other chair : cues for keeping U-step in front (with feet within) guiding to chair, not placing outside of chair, and making sure chair is back behind both legs before sitting down.   Vitals monitored due to patient increased episodes of slurring of words and confusion  PROM; Prolonged stretch of RLE due to pain in standing : hamstring, IR, ER, popliteal angle 60 seconds each, adductor tone make adductor stretch challenging.   TraA contraction 10x 5 seconds   Seated marches: cues for uptight posture 20x   Seated LAQ 10x each leg, cues for uptight posture 3 second holds   Walking 24f with U step and cues for upright posture and lifting LE's , verbal cues for staying within U step                              PT Education - 05/16/17 0938    Education provided  Yes    Education Details  safe transfers with AD, SPT transfers, balance and mobility     Person(s) Educated  Patient    Methods   Explanation;Demonstration;Verbal cues    Comprehension  Verbalized understanding;Returned demonstration       PT Short Term Goals - 05/11/17 1353      PT SHORT TERM GOAL #1   Title  Pt will perform HEP with family's  supervision, for improved balance, transfers, and gait.      Baseline  becoming more compliant but not every day     Time  4    Period  Weeks    Status  Partially Met      PT SHORT TERM GOAL #2   Title  Pt will perform 5x sit<>stand in less than or equal to 15 seconds for improved efficiency and safety with transfers.    Baseline  17 seconds    Time  2    Period  Weeks    Status  New        PT Long Term Goals - 05/11/17 1353      PT LONG TERM GOAL #1   Title  Pt will decrease 5TSTS by at least 3 seconds in order to demonstrate clinically significant improvement in LE strength       PT LONG TERM GOAL #2   Title  Pt will decrease TUG by at least 3.4s in order to demonstrate decreased fall risk       PT LONG TERM GOAL #3   Title  Pt will improve BERG by at least 3 points in order to demonstrate clinically significant improvement in balance.      PT LONG TERM GOAL #4   Title  Pt/wife will verbalize understanding of plans for continued community fitness upon D/C from PT including aquatic therapy program      PT LONG TERM GOAL #5   Title  Pt will decrease mODI scoreby at least 13 points in order demonstrate clinically significant reduction in pain/disability       PT LONG TERM GOAL #6   Title  Pt will improve BERG to 44/56  in order to demonstrate clinically significant improvement in balance for decreased fall risk            Plan - 05/16/17 1225    Clinical Impression Statement  Patient demonstrating increased episodes of slurring and confusion, vitals monitored throughout session due to this. Safe transfer education performed with patient requiring frequent verbal and tactile cueing.  Patient educated on safe amublation with U step with cue of  "keep feet  within walker". Patient will continue to benefit from skilled physical therapy to improve static and dynamic standing balance and increase endurance.     Rehab Potential  Fair    PT Frequency  2x / week    PT Duration  8 weeks    PT Treatment/Interventions  ADLs/Self Care Home Management;Aquatic Therapy;Cryotherapy;Electrical Stimulation;Iontophoresis 48m/ml Dexamethasone;Moist Heat;Ultrasound;Contrast Bath;DME Instruction;Gait training;Stair training;Functional mobility training;Therapeutic activities;Therapeutic exercise;Balance training;Neuromuscular re-education;Patient/family education;Manual techniques;Wheelchair mobility training;Energy conservation;Passive range of motion    PT Next Visit Plan  progress balance, strength, aquatic therapy    PT Home Exercise Plan  None provided at this time until further assessed, wife reports pt [facility]    Consulted and Agree with Plan of Care  Patient;Family member/caregiver    Family Member Consulted  wife       Patient will benefit from skilled therapeutic intervention in order to improve the following deficits and impairments:  Abnormal gait, Decreased balance, Decreased coordination, Decreased mobility, Impaired tone, Postural dysfunction  Visit Diagnosis: Muscle weakness (generalized)  Repeated falls  Unsteadiness on feet  Other abnormalities of gait and mobility     Problem List Patient Active Problem List   Diagnosis Date Noted  . 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 Radicular pain in right arm 10/09/2016  . Rash  09/03/2016  . GERD (gastroesophageal reflux disease) 07/31/2016  . Trochanteric bursitis 03/03/2016  . Left hand pain 10/30/2015  . Fracture, finger, multiple sites 10/30/2015  . Colon cancer screening 07/23/2015  . Healthcare maintenance 07/23/2015  . Gout 02/18/2015  . Depression 01/06/2015  . Irritation of eyelid 08/20/2014  . Dupuytren's contracture 08/20/2014  . Cough 07/21/2014   . Lumbar stenosis with neurogenic claudication 06/13/2014  . Preop cardiovascular exam 05/28/2014  . S/P deep brain stimulator placement 05/08/2014  . Joint pain 01/22/2014  . Medicare annual wellness visit, initial 01/19/2014  . Advance care planning 01/19/2014  . Parkinson's disease ([place]) 12/13/2013  . PTSD (post-traumatic stress disorder) 06/13/2013  . Erectile dysfunction 06/13/2013  . HLD (hyperlipidemia) 06/13/2013  . Hip pain 06/10/2013  . Pain in joint, shoulder region 06/10/2013  . Right leg swelling 06/03/2013  . Essential hypertension 06/03/2013  . Bradycardia by electrocardiogram 06/03/2013  . Obstructive sleep apnea 03/12/2013  . Spinal stenosis, lumbar region, with neurogenic claudication 12/14/2012   Janna Arch, PT, DPT   Janna Arch 05/16/2017, 12:26 PM  [place] MAIN [facility] SERVICES [address] [place], [place], [address] Phone: [phone]   Fax:  [phone]  Name: [name] MRN: [id number] Date of Birth: 11/26/1950

## 2017-05-17 DIAGNOSIS — R030 Elevated blood-pressure reading, without diagnosis of hypertension: Secondary | ICD-10-CM | POA: Diagnosis not present

## 2017-05-17 DIAGNOSIS — M5412 Radiculopathy, cervical region: Secondary | ICD-10-CM | POA: Diagnosis not present

## 2017-05-18 ENCOUNTER — Other Ambulatory Visit: Payer: Self-pay

## 2017-05-18 ENCOUNTER — Ambulatory Visit: Payer: Medicare PPO

## 2017-05-18 ENCOUNTER — Ambulatory Visit: Payer: Self-pay | Admitting: Psychology

## 2017-05-18 ENCOUNTER — Ambulatory Visit (INDEPENDENT_AMBULATORY_CARE_PROVIDER_SITE_OTHER): Payer: Medicare PPO | Admitting: Psychology

## 2017-05-18 DIAGNOSIS — R278 Other lack of coordination: Secondary | ICD-10-CM | POA: Diagnosis not present

## 2017-05-18 DIAGNOSIS — R2689 Other abnormalities of gait and mobility: Secondary | ICD-10-CM | POA: Diagnosis not present

## 2017-05-18 DIAGNOSIS — M6281 Muscle weakness (generalized): Secondary | ICD-10-CM

## 2017-05-18 DIAGNOSIS — G20 Parkinson's disease: Secondary | ICD-10-CM

## 2017-05-18 DIAGNOSIS — R262 Difficulty in walking, not elsewhere classified: Secondary | ICD-10-CM

## 2017-05-18 DIAGNOSIS — R293 Abnormal posture: Secondary | ICD-10-CM

## 2017-05-18 DIAGNOSIS — R279 Unspecified lack of coordination: Secondary | ICD-10-CM

## 2017-05-18 DIAGNOSIS — R2681 Unsteadiness on feet: Secondary | ICD-10-CM

## 2017-05-18 DIAGNOSIS — R296 Repeated falls: Secondary | ICD-10-CM

## 2017-05-18 DIAGNOSIS — F4321 Adjustment disorder with depressed mood: Secondary | ICD-10-CM

## 2017-05-18 NOTE — Therapy (Signed)
[place] MAIN [facility] SERVICES [address] [place], [place], [address] Phone: [phone]   Fax:  [phone]  Physical Therapy Treatment  Patient Details  Name: [name] MRN: [id number] Date of Birth: Sep 15, 1950 Referring Provider: Alonza Bogus, DO   Encounter Date: 05/18/2017  PT End of Session - 05/18/17 1321    Visit Number  20    Number of Visits  33    Date for PT Re-Evaluation  05/30/17    PT Start Time  1000    PT Stop Time  1100    PT Time Calculation (min)  60 min    Activity Tolerance  Patient tolerated treatment well    Behavior During Therapy  [facility] for tasks assessed/performed       Past Medical History:  Diagnosis Date  . Arthritis   . Bradycardia   . Cancer [facility]) 2013   skin cancer  . Depression    ptsd  . Dysrhythmia    chronic slow heart rate  . GERD (gastroesophageal reflux disease)   . Headache(784.0)    tension headaches non recent  . History of chicken pox   . History of kidney stones   . Hypertension    treated with HCTZ  . Parkinson's disease ([place])    dx'ed 15 years ago  . PTSD (post-traumatic stress disorder)   . Shortness of breath dyspnea   . Sleep apnea    doesn't use C-pap  . Varicose veins     Past Surgical History:  Procedure Laterality Date  . CHOLECYSTECTOMY N/A 10/22/2014   Procedure: LAPAROSCOPIC CHOLECYSTECTOMY WITH INTRAOPERATIVE CHOLANGIOGRAM;  Surgeon: Dia Crawford III, MD;  Location: [facility] ORS;  Service: General;  Laterality: N/A;  . cyst removed      from lip as a child  . LUMBAR LAMINECTOMY/DECOMPRESSION MICRODISCECTOMY Bilateral 12/14/2012   Procedure: Bilateral lumbar three-four, four-five decompressive laminotomy/foraminotomy;  Surgeon: Charlie Pitter, MD;  Location: [place] NEURO ORS;  Service: Neurosurgery;  Laterality: Bilateral;  . PULSE GENERATOR IMPLANT Bilateral 12/13/2013   Procedure: Bilateral implantable pulse generator placement;  Surgeon: Erline Levine, MD;  Location: [place]  NEURO ORS;  Service: Neurosurgery;  Laterality: Bilateral;  Bilateral implantable pulse generator placement  . skin cancer removed     from ears,   12 lft arm  rt leg 15  . SUBTHALAMIC STIMULATOR INSERTION Bilateral 12/06/2013   Procedure: SUBTHALAMIC STIMULATOR INSERTION;  Surgeon: Erline Levine, MD;  Location: [place] NEURO ORS;  Service: Neurosurgery;  Laterality: Bilateral;  Bilateral deep brain stimulator placement    There were no vitals filed for this visit.  Subjective Assessment - 05/18/17 1316    Subjective  Pt reports no falls in the past couple of days. Pt received cortisone injections yesterday and notes R shoulder soreness. Pt also reports having a "bad balance" day today.     Patient is accompained by:  Family member    Pertinent History  Patient referred for physical therapy due to wife's request for patient to get aquatic therapy to help with his conditioning. Pt is also complaining of chronic low back and R hip pain and frequent falls. 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 The last time he was in the water he was very uncomfortable because he didn't feel like he had any control. Pt has a history of 2 low back surgeries in August 2014 and February 2016. Pt and wife are unable to  recall exactly what was performed but they know that he had stenosis. Surgeries were performed by Dr. Annette Mcgee in [place]. Pt reports that his low back and R hip pain are constant. He describes the pain in the back as "dull" and the pain in the hip as "sharp." For both back and hip worst pain is 10/10, Best: 2/10, Present: 2/10. No particular episode of trauma to his low back or hip preceding the pain. Pt has a history of Parkinson's with a deep brain stimulator. He ambulates with a 4 wheeled walker called a U-step (automatic brakes, audio cues for stepping). He states that he falls frequently and reports that he has had approximately 40-50 falls in the last 6  months. No specific recent changes in his health.       Enters/exits pool via ramp; requires Min to Mod A exiting this session Participates in the following  Ambulation with kick boards for UE support - 4 L fwd - 4 L side  - 2 L bwd - 2 L side with squat  Stand core with LE strengthening, B, 20x ea - hip flex/ext - hip abd/add - Deep squat, light touch support  Balance  - Sumo squat hold with perturbations - Static stand perturbations - SLS, without UE support (next to rail as needed for balance)  Step  - Alternating toe taps, 30x  - SL step ups, B 2 x 10                         PT Education - 05/18/17 1320    Education Details  using focal point during balance activities. Slowing activity or stopping and re grouping to regain balance       PT Short Term Goals - 05/11/17 1353      PT SHORT TERM GOAL #1   Title  Pt will perform HEP with family's supervision, for improved balance, transfers, and gait.      Baseline  becoming more compliant but not every day     Time  4    Period  Weeks    Status  Partially Met      PT SHORT TERM GOAL #2   Title  Pt will perform 5x sit<>stand in less than or equal to 15 seconds for improved efficiency and safety with transfers.    Baseline  17 seconds    Time  2    Period  Weeks    Status  New        PT Long Term Goals - 05/11/17 1353      PT LONG TERM GOAL #1   Title  Pt will decrease 5TSTS by at least 3 seconds in order to demonstrate clinically significant improvement in LE strength       PT LONG TERM GOAL #2   Title  Pt will decrease TUG by at least 3.4s in order to demonstrate decreased fall risk       PT LONG TERM GOAL #3   Title  Pt will improve BERG by at least 3 points in order to demonstrate clinically significant improvement in balance.      PT LONG TERM GOAL #4   Title  Pt/wife will verbalize understanding of plans for continued community fitness upon D/C from PT including aquatic therapy  program      PT LONG TERM GOAL #5   Title  Pt will decrease mODI scoreby at least 13 points in order demonstrate clinically significant reduction in  pain/disability       PT LONG TERM GOAL #6   Title  Pt will improve BERG to 44/56  in order to demonstrate clinically significant improvement in balance for decreased fall risk            Plan - 05/18/17 1321    Clinical Impression Statement  Greater difficulty with balance/coordination this session; requires Min A to Mod A at times and consistent cues. Pt demonstrates difficulty following cues to slow down and/or stop activity to re group. Very unsteady exiting pool up ramp requiring increased assist.     Rehab Potential  Fair    PT Frequency  2x / week    PT Duration  8 weeks    PT Treatment/Interventions  ADLs/Self Care Home Management;Aquatic Therapy;Cryotherapy;Electrical Stimulation;Iontophoresis 4mg/ml Dexamethasone;Moist Heat;Ultrasound;Contrast Bath;DME Instruction;Gait training;Stair training;Functional mobility training;Therapeutic activities;Therapeutic exercise;Balance training;Neuromuscular re-education;Patient/family education;Manual techniques;Wheelchair mobility training;Energy conservation;Passive range of motion    PT Next Visit Plan  progress balance, strength, aquatic therapy    PT Home Exercise Plan  None provided at this time until further assessed, wife reports pt does not perform current HEP from HH PT    Consulted and Agree with Plan of Care  Patient;Family member/caregiver    Family Member Consulted  wife       Patient will benefit from skilled therapeutic intervention in order to improve the following deficits and impairments:  Abnormal gait, Decreased balance, Decreased coordination, Decreased mobility, Impaired tone, Postural dysfunction  Visit Diagnosis: Muscle weakness (generalized)  Repeated falls  Unsteadiness on feet  Other abnormalities of gait and mobility  Parkinson's disease  (HCC)  Unspecified lack of coordination  Other lack of coordination  Abnormal posture  Difficulty walking     Problem List Patient Active Problem List   Diagnosis Date Noted  . 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 Radicular pain in right arm 10/09/2016  . Rash 09/03/2016  . GERD (gastroesophageal reflux disease) 07/31/2016  . Trochanteric bursitis 03/03/2016  . Left hand pain 10/30/2015  . Fracture, finger, multiple sites 10/30/2015  . Colon cancer screening 07/23/2015  . Healthcare maintenance 07/23/2015  . Gout 02/18/2015  . Depression 01/06/2015  . Irritation of eyelid 08/20/2014  . Dupuytren's contracture 08/20/2014  . Cough 07/21/2014  . Lumbar stenosis with neurogenic claudication 06/13/2014  . Preop cardiovascular exam 05/28/2014  . S/P deep brain stimulator placement 05/08/2014  . Joint pain 01/22/2014  . Medicare annual wellness visit, initial 01/19/2014  . Advance care planning 01/19/2014  . Parkinson's disease (HCC) 12/13/2013  . PTSD (post-traumatic stress disorder) 06/13/2013  . Erectile dysfunction 06/13/2013  . HLD (hyperlipidemia) 06/13/2013  . Hip pain 06/10/2013  . Pain in joint, shoulder region 06/10/2013  . Right leg swelling 06/03/2013  . Essential hypertension 06/03/2013  . Bradycardia by electrocardiogram 06/03/2013  . Obstructive sleep apnea 03/12/2013  . Spinal stenosis, lumbar region, with neurogenic claudication 12/14/2012    Heidi E Barnes 05/18/2017, 1:25 PM  [facility] MAIN REHAB SERVICES [address] [place], [place], [address] Phone: [phone]   Fax:  [phone]  Name: Jesson D Keim MRN: [id number] Date of Birth: 08/26/1950   

## 2017-05-23 ENCOUNTER — Other Ambulatory Visit: Payer: Self-pay

## 2017-05-23 ENCOUNTER — Ambulatory Visit: Payer: Medicare PPO | Attending: Neurology

## 2017-05-23 DIAGNOSIS — R2681 Unsteadiness on feet: Secondary | ICD-10-CM | POA: Diagnosis not present

## 2017-05-23 DIAGNOSIS — M48062 Spinal stenosis, lumbar region with neurogenic claudication: Secondary | ICD-10-CM | POA: Diagnosis present

## 2017-05-23 DIAGNOSIS — M25551 Pain in right hip: Secondary | ICD-10-CM | POA: Diagnosis present

## 2017-05-23 DIAGNOSIS — R279 Unspecified lack of coordination: Secondary | ICD-10-CM | POA: Diagnosis not present

## 2017-05-23 DIAGNOSIS — R293 Abnormal posture: Secondary | ICD-10-CM | POA: Diagnosis not present

## 2017-05-23 DIAGNOSIS — G8929 Other chronic pain: Secondary | ICD-10-CM | POA: Diagnosis present

## 2017-05-23 DIAGNOSIS — Z0000 Encounter for general adult medical examination without abnormal findings: Secondary | ICD-10-CM | POA: Insufficient documentation

## 2017-05-23 DIAGNOSIS — M25559 Pain in unspecified hip: Secondary | ICD-10-CM | POA: Diagnosis present

## 2017-05-23 DIAGNOSIS — M6281 Muscle weakness (generalized): Secondary | ICD-10-CM | POA: Insufficient documentation

## 2017-05-23 DIAGNOSIS — R296 Repeated falls: Secondary | ICD-10-CM | POA: Insufficient documentation

## 2017-05-23 DIAGNOSIS — M25512 Pain in left shoulder: Secondary | ICD-10-CM | POA: Diagnosis present

## 2017-05-23 DIAGNOSIS — R262 Difficulty in walking, not elsewhere classified: Secondary | ICD-10-CM | POA: Diagnosis not present

## 2017-05-23 DIAGNOSIS — R2689 Other abnormalities of gait and mobility: Secondary | ICD-10-CM | POA: Diagnosis not present

## 2017-05-23 DIAGNOSIS — M545 Low back pain, unspecified: Secondary | ICD-10-CM

## 2017-05-23 DIAGNOSIS — G20 Parkinson's disease: Secondary | ICD-10-CM

## 2017-05-23 DIAGNOSIS — R278 Other lack of coordination: Secondary | ICD-10-CM | POA: Diagnosis not present

## 2017-05-23 NOTE — Therapy (Signed)
[place] MAIN [facility] SERVICES [address] [place], [place], [address] Phone: [phone]   Fax:  [phone]  Physical Therapy Treatment  Patient Details  Name: [name] MRN: [id number] Date of Birth: 1950/08/03 Referring Provider: Alonza Bogus, DO   Encounter Date: 05/23/2017  PT End of Session - 05/23/17 1357    Visit Number  21    Number of Visits  33    Date for PT Re-Evaluation  05/30/17    PT Start Time  0930    PT Stop Time  5885    PT Time Calculation (min)  65 min    Activity Tolerance  Patient tolerated treatment well       Past Medical History:  Diagnosis Date  . Arthritis   . Bradycardia   . Cancer [facility]) 2013   skin cancer  . Depression    ptsd  . Dysrhythmia    chronic slow heart rate  . GERD (gastroesophageal reflux disease)   . Headache(784.0)    tension headaches non recent  . History of chicken pox   . History of kidney stones   . Hypertension    treated with HCTZ  . Parkinson's disease ([place])    dx'ed 15 years ago  . PTSD (post-traumatic stress disorder)   . Shortness of breath dyspnea   . Sleep apnea    doesn't use C-pap  . Varicose veins     Past Surgical History:  Procedure Laterality Date  . CHOLECYSTECTOMY N/A 10/22/2014   Procedure: LAPAROSCOPIC CHOLECYSTECTOMY WITH INTRAOPERATIVE CHOLANGIOGRAM;  Surgeon: Dia Crawford III, MD;  Location: [facility] ORS;  Service: General;  Laterality: N/A;  . cyst removed      from lip as a child  . LUMBAR LAMINECTOMY/DECOMPRESSION MICRODISCECTOMY Bilateral 12/14/2012   Procedure: Bilateral lumbar three-four, four-five decompressive laminotomy/foraminotomy;  Surgeon: Charlie Pitter, MD;  Location: [place] NEURO ORS;  Service: Neurosurgery;  Laterality: Bilateral;  . PULSE GENERATOR IMPLANT Bilateral 12/13/2013   Procedure: Bilateral implantable pulse generator placement;  Surgeon: Erline Levine, MD;  Location: [place] NEURO ORS;  Service: Neurosurgery;  Laterality: Bilateral;   Bilateral implantable pulse generator placement  . skin cancer removed     from ears,   12 lft arm  rt leg 15  . SUBTHALAMIC STIMULATOR INSERTION Bilateral 12/06/2013   Procedure: SUBTHALAMIC STIMULATOR INSERTION;  Surgeon: Erline Levine, MD;  Location: [place] NEURO ORS;  Service: Neurosurgery;  Laterality: Bilateral;  Bilateral deep brain stimulator placement    There were no vitals filed for this visit.  Subjective Assessment - 05/23/17 1353    Subjective  Pt does not recall any "significant falls over the past week. Pt has no voiced complaints. Of note, pt much more flat affect today during session with very little conversation/responses ( usually pt coverses/jokes)    Patient is accompained by:  Family member    Pertinent History  Patient referred for physical therapy due to wife's request for patient to get aquatic therapy to help with his conditioning. Pt is also complaining of chronic low back and R hip pain and frequent falls. 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 The last time he was in the water he was very uncomfortable because he didn't feel like he had any control. Pt has a history of 2 low back surgeries in August 2014 and February 2016. Pt and wife are unable to recall exactly what was performed but they  know that he had stenosis. Surgeries were performed by Dr. Annette Stable in [place]. Pt reports that his low back and R hip pain are constant. He describes the pain in the back as "dull" and the pain in the hip as "sharp." For both back and hip worst pain is 10/10, Best: 2/10, Present: 2/10. No particular episode of trauma to his low back or hip preceding the pain. Pt has a history of Parkinson's with a deep brain stimulator. He ambulates with a 4 wheeled walker called a U-step (automatic brakes, audio cues for stepping). He states that he falls frequently and reports that he has had approximately 40-50 falls in the last 6 months. No  specific recent changes in his health.       Enters/exits pool via ramp Participates in the following  Ambulation with kick boards for UE support - 4 L fwd - 2 L side step with squat  Bench core/LE strength, 3# - Hip straight leg flex return to abd, R/L 10x - bicycle, 5 min - STS, no UE support hands together, 25x  Rail core/LE strength, 3# - B hip ext, 20x ea - Close grip push ups 2 x 10   Stand dynamic core strength/balance with noodle - High knee march, slow concentrate on abdominals, 20x ea side - Noodle L lift to R with trunk rotation; wt shift R, pivot on L, 20x - Noodle R lift to L with trunk rotation; wt shift L, pivot on R, 20x   Noodle "sword fighting" with stepping, R/L, long lever arms. 12 min                           PT Education - 05/23/17 1402    Education Details  pushups at rail       PT Short Term Goals - 05/11/17 1353      PT SHORT TERM GOAL #1   Title  Pt will perform HEP with family's supervision, for improved balance, transfers, and gait.      Baseline  becoming more compliant but not every day     Time  4    Period  Weeks    Status  Partially Met      PT SHORT TERM GOAL #2   Title  Pt will perform 5x sit<>stand in less than or equal to 15 seconds for improved efficiency and safety with transfers.    Baseline  17 seconds    Time  2    Period  Weeks    Status  New        PT Long Term Goals - 05/11/17 1353      PT LONG TERM GOAL #1   Title  Pt will decrease 5TSTS by at least 3 seconds in order to demonstrate clinically significant improvement in LE strength       PT LONG TERM GOAL #2   Title  Pt will decrease TUG by at least 3.4s in order to demonstrate decreased fall risk       PT LONG TERM GOAL #3   Title  Pt will improve BERG by at least 3 points in order to demonstrate clinically significant improvement in balance.      PT LONG TERM GOAL #4   Title  Pt/wife will verbalize understanding of plans for  continued community fitness upon D/C from PT including aquatic therapy program      PT LONG TERM GOAL #5   Title  Pt will decrease  mODI scoreby at least 13 points in order demonstrate clinically significant reduction in pain/disability       PT LONG TERM GOAL #6   Title  Pt will improve BERG to 44/56  in order to demonstrate clinically significant improvement in balance for decreased fall risk            Plan - 05/23/17 1357    Clinical Impression Statement  Side step with squat posing challenge primarily due to pt natural response is to move with momentum of initial side step during the squat phase; improves with light tactile cues to prevent lean toward stepping side. Pt tolerated progression of core/LE strength with 3# wts and stand balance activity with dynamic upper body movements and rotation. Pt became much more interactive and animated with purposeful introduction of "sword swing with noodles" using stepping pattern for increased balance challenge. Continue to progress dynamic balance and increased strengthening activities for core and LEs.      Rehab Potential  Fair    PT Frequency  2x / week    PT Duration  8 weeks    PT Treatment/Interventions  ADLs/Self Care Home Management;Aquatic Therapy;Cryotherapy;Electrical Stimulation;Iontophoresis 55m/ml Dexamethasone;Moist Heat;Ultrasound;Contrast Bath;DME Instruction;Gait training;Stair training;Functional mobility training;Therapeutic activities;Therapeutic exercise;Balance training;Neuromuscular re-education;Patient/family education;Manual techniques;Wheelchair mobility training;Energy conservation;Passive range of motion    PT Next Visit Plan  progress balance, strength, aquatic therapy    PT Home Exercise Plan  None provided at this time until further assessed, wife reports [facility]    Consulted and Agree with Plan of Care  Patient;Family member/caregiver    Family Member Consulted  wife        Patient will benefit from skilled therapeutic intervention in order to improve the following deficits and impairments:  Abnormal gait, Decreased balance, Decreased coordination, Decreased mobility, Impaired tone, Postural dysfunction  Visit Diagnosis: Muscle weakness (generalized)  Repeated falls  Unsteadiness on feet  Other abnormalities of gait and mobility  Parkinson's disease (HCC)  Unspecified lack of coordination  Other lack of coordination  Abnormal posture  Difficulty walking  Chronic midline low back pain without sciatica     Problem List Patient Active Problem List   Diagnosis Date Noted  . 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 Radicular pain in right arm 10/09/2016  . Rash 09/03/2016  . GERD (gastroesophageal reflux disease) 07/31/2016  . Trochanteric bursitis 03/03/2016  . Left hand pain 10/30/2015  . Fracture, finger, multiple sites 10/30/2015  . Colon cancer screening 07/23/2015  . Healthcare maintenance 07/23/2015  . Gout 02/18/2015  . Depression 01/06/2015  . Irritation of eyelid 08/20/2014  . Dupuytren's contracture 08/20/2014  . Cough 07/21/2014  . Lumbar stenosis with neurogenic claudication 06/13/2014  . Preop cardiovascular exam 05/28/2014  . S/P deep brain stimulator placement 05/08/2014  . Joint pain 01/22/2014  . Medicare annual wellness visit, initial 01/19/2014  . Advance care planning 01/19/2014  . [place]'s disease ([place] 12/13/2013  . PTSD (post-traumatic stress disorder) 06/13/2013  . Erectile dysfunction 06/13/2013  . HLD (hyperlipidemia) 06/13/2013  . Hip pain 06/10/2013  . Pain in joint, shoulder region 06/10/2013  . Right leg swelling 06/03/2013  . Essential hypertension 06/03/2013  . Bradycardia by electrocardiogram 06/03/2013  . Obstructive sleep apnea 03/12/2013  . Spinal stenosis, lumbar region, with neurogenic claudication 12/14/2012    HLarae Grooms2/08/2017, 2:03 PM  [place] [address]Phone: [phone]  Fax:  [phone]  Name: [name] MRN: [id number] Date of Birth: November 25, 1950

## 2017-05-24 ENCOUNTER — Encounter: Payer: Self-pay | Admitting: Internal Medicine

## 2017-05-25 ENCOUNTER — Ambulatory Visit: Payer: Medicare PPO

## 2017-05-25 DIAGNOSIS — G20 Parkinson's disease: Secondary | ICD-10-CM | POA: Diagnosis not present

## 2017-05-25 DIAGNOSIS — R296 Repeated falls: Secondary | ICD-10-CM

## 2017-05-25 DIAGNOSIS — M6281 Muscle weakness (generalized): Secondary | ICD-10-CM | POA: Diagnosis not present

## 2017-05-25 DIAGNOSIS — R2689 Other abnormalities of gait and mobility: Secondary | ICD-10-CM | POA: Diagnosis not present

## 2017-05-25 DIAGNOSIS — R262 Difficulty in walking, not elsewhere classified: Secondary | ICD-10-CM | POA: Diagnosis not present

## 2017-05-25 DIAGNOSIS — R2681 Unsteadiness on feet: Secondary | ICD-10-CM

## 2017-05-25 DIAGNOSIS — R279 Unspecified lack of coordination: Secondary | ICD-10-CM | POA: Diagnosis not present

## 2017-05-25 DIAGNOSIS — R293 Abnormal posture: Secondary | ICD-10-CM | POA: Diagnosis not present

## 2017-05-25 DIAGNOSIS — R278 Other lack of coordination: Secondary | ICD-10-CM | POA: Diagnosis not present

## 2017-05-25 NOTE — Therapy (Signed)
[place] MAIN [facility] SERVICES [address] [place], [place], [address] Phone: [phone]   Fax:  [phone]  Physical Therapy Treatment  Patient Details  Name: [name] MRN: [id number] Date of Birth: 04-24-1950 Referring Provider: Alonza Bogus, DO   Encounter Date: 05/25/2017  PT End of Session - 05/25/17 1325    Visit Number  22    Number of Visits  49    Date for PT Re-Evaluation  07/20/17    PT Start Time  1100    PT Stop Time  1146    PT Time Calculation (min)  46 min    Equipment Utilized During Treatment  Gait belt    Activity Tolerance  Patient tolerated treatment well;Treatment limited secondary to medical complications (Comment)    Behavior During Therapy  [facility] for tasks assessed/performed       Past Medical History:  Diagnosis Date  . Arthritis   . Bradycardia   . Cancer [facility]) 2013   skin cancer  . Depression    ptsd  . Dysrhythmia    chronic slow heart rate  . GERD (gastroesophageal reflux disease)   . Headache(784.0)    tension headaches non recent  . History of chicken pox   . History of kidney stones   . Hypertension    treated with HCTZ  . Parkinson's disease ([place])    dx'ed 15 years ago  . PTSD (post-traumatic stress disorder)   . Shortness of breath dyspnea   . Sleep apnea    doesn't use C-pap  . Varicose veins     Past Surgical History:  Procedure Laterality Date  . CHOLECYSTECTOMY N/A 10/22/2014   Procedure: LAPAROSCOPIC CHOLECYSTECTOMY WITH INTRAOPERATIVE CHOLANGIOGRAM;  Surgeon: Dia Crawford III, MD;  Location: [facility] ORS;  Service: General;  Laterality: N/A;  . cyst removed      from lip as a child  . LUMBAR LAMINECTOMY/DECOMPRESSION MICRODISCECTOMY Bilateral 12/14/2012   Procedure: Bilateral lumbar three-four, four-five decompressive laminotomy/foraminotomy;  Surgeon: Charlie Pitter, MD;  Location: [place] NEURO ORS;  Service: Neurosurgery;  Laterality: Bilateral;  . PULSE GENERATOR IMPLANT Bilateral  12/13/2013   Procedure: Bilateral implantable pulse generator placement;  Surgeon: Erline Levine, MD;  Location: [place] NEURO ORS;  Service: Neurosurgery;  Laterality: Bilateral;  Bilateral implantable pulse generator placement  . skin cancer removed     from ears,   12 lft arm  rt leg 15  . SUBTHALAMIC STIMULATOR INSERTION Bilateral 12/06/2013   Procedure: SUBTHALAMIC STIMULATOR INSERTION;  Surgeon: Erline Levine, MD;  Location: [place] NEURO ORS;  Service: Neurosurgery;  Laterality: Bilateral;  Bilateral deep brain stimulator placement    There were no vitals filed for this visit.  Subjective Assessment - 05/25/17 1104    Subjective  Patient's wife reports that they want to continue with aquatic. Pt. is more challenged with verbalization today.     Patient is accompained by:  Family member    Pertinent History  Patient referred for physical therapy due to wife's request for patient to get aquatic therapy to help with his conditioning. Pt is also complaining of chronic low back and R hip pain and frequent falls. 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 The last time he was in the water he was very uncomfortable because he didn't feel like he had any control. Pt has a history of 2 low back surgeries in August 2014 and February 2016. Pt and  wife are unable to recall exactly what was performed but they know that he had stenosis. Surgeries were performed by Dr. Annette Stable in [place]. Pt reports that his low back and R hip pain are constant. He describes the pain in the back as "dull" and the pain in the hip as "sharp." For both back and hip worst pain is 10/10, Best: 2/10, Present: 2/10. No particular episode of trauma to his low back or hip preceding the pain. Pt has a history of Parkinson's with a deep brain stimulator. He ambulates with a 4 wheeled walker called a U-step (automatic brakes, audio cues for stepping). He states that he falls frequently and reports  that he has had approximately 40-50 falls in the last 6 months. No specific recent changes in his health.     Currently in Pain?  Yes    Pain Score  1     Pain Location  Shoulder    Pain Orientation  Right    Pain Descriptors / Indicators  Aching    Pain Type  Chronic pain    Pain Onset  More than a month ago    Pain Frequency  Intermittent     157/72 pulse 49   5x STS: 18 seconds  TUG: 19 seconds  MODI= 42% BERG= 41/56  [facility] PT Assessment - 05/25/17 0001      Berg Balance Test   Sit to Stand  Able to stand  independently using hands    Standing Unsupported  Able to stand safely 2 minutes    Sitting with Back Unsupported but Feet Supported on Floor or Stool  Able to sit safely and securely 2 minutes    Stand to Sit  Controls descent by using hands    Transfers  Able to transfer safely, definite need of hands    Standing Unsupported with Eyes Closed  Able to stand 10 seconds with supervision    Standing Ubsupported with Feet Together  Able to place feet together independently and stand 1 minute safely    From Standing, Reach Forward with Outstretched Arm  Can reach forward >12 cm safely (5")    From Standing Position, Pick up Object from Floor  Able to pick up shoe safely and easily    From Standing Position, Turn to Look Behind Over each Shoulder  Looks behind from both sides and weight shifts well    Turn 360 Degrees  Able to turn 360 degrees safely but slowly    Standing Unsupported, Alternately Place Feet on Step/Stool  Able to complete >2 steps/needs minimal assist    Standing Unsupported, One Foot in Front  Able to take small step independently and hold 30 seconds    Standing on One Leg  Tries to lift leg/unable to hold 3 seconds but remains standing independently    Total Score  41     Static standing without UE support 3x 30 seconds  Static standing without UE support with UE raises with weighted ball (2000 g) 10x   Step over hurdle with SUE to simulate stepping out of  shower. 6x, , short so implemented 18 in step 20x with occasional LOB requiring CGA and use of arms to stabilize self  Airex pad: toss 20 balls into basket, 2 episodes of LOB, CGA good weight shift R and L  High knee marching with BUE 6x CGA, cues for not crossing leg, excessive scissoring  Side stepping in // bars 6 with BUE  PT Education - 05/25/17 1313    Education provided  Yes    Education Details  POC, static and dynamic balance for bathroom mobility.     Person(s) Educated  Patient    Methods  Explanation;Demonstration;Verbal cues    Comprehension  Verbalized understanding;Returned demonstration       PT Short Term Goals - 05/25/17 1328      PT SHORT TERM GOAL #1   Title  Pt will perform HEP with family's supervision, for improved balance, transfers, and gait.      Baseline  patient reports compliance, however not every day    Time  4    Period  Weeks    Status  Partially Met      PT SHORT TERM GOAL #2   Title  Pt will perform 5x sit<>stand in less than or equal to 15 seconds for improved efficiency and safety with transfers.    Baseline  19 seconds    Time  2    Period  Weeks    Status  On-going        PT Long Term Goals - 05/25/17 1328      PT LONG TERM GOAL #1   Title  Pt will decrease 5TSTS by at least 3 seconds in order to demonstrate clinically significant improvement in LE strength     Baseline  02/08/17: 20.1 seconds 12/18: 17 seconds     Time  8    Period  Weeks    Status  Achieved      PT LONG TERM GOAL #2   Title  Pt will decrease TUG by at least 3.4s in order to demonstrate decreased fall risk     Baseline  02/08/17: 22.1s 12/18: 20 seconds 2/7: 19 seconds    Time  8    Period  Weeks    Status  Partially Met      PT LONG TERM GOAL #3   Title  Pt will improve BERG by at least 3 points in order to demonstrate clinically significant improvement in balance.    Baseline  02/08/17: 30/56 12/18: 38/56      Time   8    Period  Weeks    Status  Achieved      PT LONG TERM GOAL #4   Title  Pt/wife will verbalize understanding of plans for continued community fitness upon D/C from PT including aquatic therapy program    Baseline  02/08/17: pt currently reports being more comfortable in the water    Time  8    Period  Weeks    Status  Partially Met      PT LONG TERM GOAL #5   Title  Pt will decrease mODI scoreby at least 13 points in order demonstrate clinically significant reduction in pain/disability     Baseline  02/08/17: 54% 12/18: 46% 2/7: 42%    Time  8    Period  Weeks    Status  Partially Met      PT LONG TERM GOAL #6   Title  Pt will improve BERG to 44/56  in order to demonstrate clinically significant improvement in balance for decreased fall risk    Baseline  12/18: 38/56 2/7: 41/56    Time  8    Period  Weeks    Status  Partially Met            Plan - 05/25/17 1341    Clinical Impression Statement  Patient progressing towards goals at  this time.  Requires CGA at all times due to unsteadiness and impulsivity. MODI= 42%, BERG improved to 41/56 with patient challenged by single limb stance and quick movements, 5x STS= 18 seconds, TUG 19 seconds. Patient and patient's wife are agreeable to continue therapy on land and in the pool due to patient's continuing progression. Patient will continue to benefit from skilled physical therapy to improve static and dynamic standing balance and increase endurance.     Rehab Potential  Fair    PT Frequency  2x / week    PT Duration  8 weeks    PT Treatment/Interventions  ADLs/Self Care Home Management;Aquatic Therapy;Cryotherapy;Electrical Stimulation;Iontophoresis '4mg'$ /ml Dexamethasone;Moist Heat;Ultrasound;Contrast Bath;DME Instruction;Gait training;Stair training;Functional mobility training;Therapeutic activities;Therapeutic exercise;Balance training;Neuromuscular re-education;Patient/family education;Manual techniques;Wheelchair mobility  training;Energy conservation;Passive range of motion    PT Next Visit Plan  progress balance, strength, aquatic therapy    PT Home Exercise Plan  None provided at this time until further assessed, wife reports pt does not perform current HEP from [facility] PT    Consulted and Agree with Plan of Care  Patient;Family member/caregiver    Family Member Consulted  wife       Patient will benefit from skilled therapeutic intervention in order to improve the following deficits and impairments:  Abnormal gait, Decreased balance, Decreased coordination, Decreased mobility, Impaired tone, Postural dysfunction  Visit Diagnosis: Muscle weakness (generalized)  Repeated falls  Unsteadiness on feet  Other abnormalities of gait and mobility     Problem List Patient Active Problem List   Diagnosis Date Noted  . 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 Radicular pain in right arm 10/09/2016  . Rash 09/03/2016  . GERD (gastroesophageal reflux disease) 07/31/2016  . Trochanteric bursitis 03/03/2016  . Left hand pain 10/30/2015  . Fracture, finger, multiple sites 10/30/2015  . Colon cancer screening 07/23/2015  . Healthcare maintenance 07/23/2015  . Gout 02/18/2015  . Depression 01/06/2015  . Irritation of eyelid 08/20/2014  . Dupuytren's contracture 08/20/2014  . Cough 07/21/2014  . Lumbar stenosis with neurogenic claudication 06/13/2014  . Preop cardiovascular exam 05/28/2014  . S/P deep brain stimulator placement 05/08/2014  . Joint pain 01/22/2014  . Medicare annual wellness visit, initial 01/19/2014  . Advance care planning 01/19/2014  . Parkinson's disease ([place]) 12/13/2013  . PTSD (post-traumatic stress disorder) 06/13/2013  . Erectile dysfunction 06/13/2013  . HLD (hyperlipidemia) 06/13/2013  . Hip pain 06/10/2013  . Pain in joint, shoulder region 06/10/2013  . Right leg swelling 06/03/2013  . Essential hypertension 06/03/2013  . Bradycardia by electrocardiogram 06/03/2013  .  Obstructive sleep apnea 03/12/2013  . Spinal stenosis, lumbar region, with neurogenic claudication 12/14/2012   Janna Arch, PT, DPT   Janna Arch 05/25/2017, 1:42 PM  [place] MAIN [facility] SERVICES [address] [place], [place], [address] Phone: [phone]   Fax:  [phone]  Name: [name] MRN: [id number] Date of Birth: 06/16/1950

## 2017-05-30 ENCOUNTER — Other Ambulatory Visit: Payer: Self-pay

## 2017-05-30 ENCOUNTER — Ambulatory Visit: Payer: Medicare PPO

## 2017-05-30 ENCOUNTER — Ambulatory Visit (INDEPENDENT_AMBULATORY_CARE_PROVIDER_SITE_OTHER): Payer: Medicare PPO | Admitting: Psychology

## 2017-05-30 DIAGNOSIS — M25559 Pain in unspecified hip: Secondary | ICD-10-CM

## 2017-05-30 DIAGNOSIS — M6281 Muscle weakness (generalized): Secondary | ICD-10-CM

## 2017-05-30 DIAGNOSIS — F331 Major depressive disorder, recurrent, moderate: Secondary | ICD-10-CM

## 2017-05-30 DIAGNOSIS — R262 Difficulty in walking, not elsewhere classified: Secondary | ICD-10-CM | POA: Diagnosis not present

## 2017-05-30 DIAGNOSIS — R2689 Other abnormalities of gait and mobility: Secondary | ICD-10-CM

## 2017-05-30 DIAGNOSIS — M25512 Pain in left shoulder: Secondary | ICD-10-CM

## 2017-05-30 DIAGNOSIS — R293 Abnormal posture: Secondary | ICD-10-CM

## 2017-05-30 DIAGNOSIS — M545 Low back pain, unspecified: Secondary | ICD-10-CM

## 2017-05-30 DIAGNOSIS — R296 Repeated falls: Secondary | ICD-10-CM | POA: Diagnosis not present

## 2017-05-30 DIAGNOSIS — R279 Unspecified lack of coordination: Secondary | ICD-10-CM | POA: Diagnosis not present

## 2017-05-30 DIAGNOSIS — M48062 Spinal stenosis, lumbar region with neurogenic claudication: Secondary | ICD-10-CM

## 2017-05-30 DIAGNOSIS — R278 Other lack of coordination: Secondary | ICD-10-CM | POA: Diagnosis not present

## 2017-05-30 DIAGNOSIS — F4323 Adjustment disorder with mixed anxiety and depressed mood: Secondary | ICD-10-CM

## 2017-05-30 DIAGNOSIS — R2681 Unsteadiness on feet: Secondary | ICD-10-CM

## 2017-05-30 DIAGNOSIS — G8929 Other chronic pain: Secondary | ICD-10-CM

## 2017-05-30 DIAGNOSIS — G20 Parkinson's disease: Secondary | ICD-10-CM | POA: Diagnosis not present

## 2017-05-30 NOTE — Therapy (Signed)
[place] MAIN [facility] SERVICES [address] [place], [place], [address] Phone: [phone]   Fax:  [phone]  Physical Therapy Treatment  Patient Details  Name: [name] MRN: [id number] Date of Birth: 06/02/1950 Referring Provider: Alonza Bogus, DO   Encounter Date: 05/30/2017  PT End of Session - 05/30/17 1412    Visit Number  23    Number of Visits  49    Date for PT Re-Evaluation  07/20/17    PT Start Time  0810    PT Stop Time  0920    PT Time Calculation (min)  70 min       Past Medical History:  Diagnosis Date  . Arthritis   . Bradycardia   . Cancer [facility]) 2013   skin cancer  . Depression    ptsd  . Dysrhythmia    chronic slow heart rate  . GERD (gastroesophageal reflux disease)   . Headache(784.0)    tension headaches non recent  . History of chicken pox   . History of kidney stones   . Hypertension    treated with HCTZ  . Parkinson's disease ([place])    dx'ed 15 years ago  . PTSD (post-traumatic stress disorder)   . Shortness of breath dyspnea   . Sleep apnea    doesn't use C-pap  . Varicose veins     Past Surgical History:  Procedure Laterality Date  . CHOLECYSTECTOMY N/A 10/22/2014   Procedure: LAPAROSCOPIC CHOLECYSTECTOMY WITH INTRAOPERATIVE CHOLANGIOGRAM;  Surgeon: Dia Crawford III, MD;  Location: [facility] ORS;  Service: General;  Laterality: N/A;  . cyst removed      from lip as a child  . LUMBAR LAMINECTOMY/DECOMPRESSION MICRODISCECTOMY Bilateral 12/14/2012   Procedure: Bilateral lumbar three-four, four-five decompressive laminotomy/foraminotomy;  Surgeon: Charlie Pitter, MD;  Location: [place] NEURO ORS;  Service: Neurosurgery;  Laterality: Bilateral;  . PULSE GENERATOR IMPLANT Bilateral 12/13/2013   Procedure: Bilateral implantable pulse generator placement;  Surgeon: Erline Levine, MD;  Location: [place] NEURO ORS;  Service: Neurosurgery;  Laterality: Bilateral;  Bilateral implantable pulse generator placement  . skin  cancer removed     from ears,   12 lft arm  rt leg 15  . SUBTHALAMIC STIMULATOR INSERTION Bilateral 12/06/2013   Procedure: SUBTHALAMIC STIMULATOR INSERTION;  Surgeon: Erline Levine, MD;  Location: [place] NEURO ORS;  Service: Neurosurgery;  Laterality: Bilateral;  Bilateral deep brain stimulator placement    There were no vitals filed for this visit.  Subjective Assessment - 05/30/17 1409    Subjective  Pt reports several falls in the past week primarily in stand with functional activities. Pt states "I just cannot seem to catch my balance". No significant injuries, but R shoulder more sore.     Patient is accompained by:  Family member    Pertinent History  Patient referred for physical therapy due to wife's request for patient to get aquatic therapy to help with his conditioning. Pt is also complaining of chronic low back and R hip pain and frequent falls. 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 The last time he was in the water he was very uncomfortable because he didn't feel like he had any control. Pt has a history of 2 low back surgeries in August 2014 and February 2016. Pt and wife are unable to recall exactly what was performed but they know that he had stenosis. Surgeries were performed by Dr. Annette Stable  in [place]. Pt reports that his low back and R hip pain are constant. He describes the pain in the back as "dull" and the pain in the hip as "sharp." For both back and hip worst pain is 10/10, Best: 2/10, Present: 2/10. No particular episode of trauma to his low back or hip preceding the pain. Pt has a history of Parkinson's with a deep brain stimulator. He ambulates with a 4 wheeled walker called a U-step (automatic brakes, audio cues for stepping). He states that he falls frequently and reports that he has had approximately 40-50 falls in the last 6 months. No specific recent changes in his health.       Enters/exits pool via  ramp Participates in the following.   Ambulation - 4 L fwd - 2 L side - 2 L side with squat, VC for each move  Bench LE strength with core - SL hip extension with dumbbell  Balance with core focus, 20x each - trunk rotation with noodle, down left to up right using core; focal point - trunk rotation "   "  " down right to up left - noodle high knee march - Stand forward cross body reach, R/L - Seated forward and cross body reach  Stand with fwd/bkwd stepping, 5 min  Education with pt/spouse on home SEATED balance/core activities                            PT Education - 05/30/17 1410    Education provided  Yes    Education Details  core stabilization and role in assisting balance. Working balance/core stabilization in controlled environment just outside of BOS and need for doing work at home to this affect each day; not just in therapy.     Person(s) Educated  Patient;Spouse    Methods  Explanation    Comprehension  Verbalized understanding       PT Short Term Goals - 05/25/17 1328      PT SHORT TERM GOAL #1   Title  Pt will perform HEP with family's supervision, for improved balance, transfers, and gait.      Baseline  patient reports compliance, however not every day    Time  4    Period  Weeks    Status  Partially Met      PT SHORT TERM GOAL #2   Title  Pt will perform 5x sit<>stand in less than or equal to 15 seconds for improved efficiency and safety with transfers.    Baseline  19 seconds    Time  2    Period  Weeks    Status  On-going        PT Long Term Goals - 05/25/17 1328      PT LONG TERM GOAL #1   Title  Pt will decrease 5TSTS by at least 3 seconds in order to demonstrate clinically significant improvement in LE strength     Baseline  02/08/17: 20.1 seconds 12/18: 17 seconds     Time  8    Period  Weeks    Status  Achieved      PT LONG TERM GOAL #2   Title  Pt will decrease TUG by at least 3.4s in order to demonstrate  decreased fall risk     Baseline  02/08/17: 22.1s 12/18: 20 seconds 2/7: 19 seconds    Time  8    Period  Weeks    Status  Partially Met      PT LONG TERM GOAL #3   Title  Pt will improve BERG by at least 3 points in order to demonstrate clinically significant improvement in balance.    Baseline  02/08/17: 30/56 12/18: 38/56      Time  8    Period  Weeks    Status  Achieved      PT LONG TERM GOAL #4   Title  Pt/wife will verbalize understanding of plans for continued community fitness upon D/C from PT including aquatic therapy program    Baseline  02/08/17: pt currently reports being more comfortable in the water    Time  8    Period  Weeks    Status  Partially Met      PT LONG TERM GOAL #5   Title  Pt will decrease mODI scoreby at least 13 points in order demonstrate clinically significant reduction in pain/disability     Baseline  02/08/17: 54% 12/18: 46% 2/7: 42%    Time  8    Period  Weeks    Status  Partially Met      PT LONG TERM GOAL #6   Title  Pt will improve BERG to 44/56  in order to demonstrate clinically significant improvement in balance for decreased fall risk    Baseline  12/18: 38/56 2/7: 41/56    Time  8    Period  Weeks    Status  Partially Met            Plan - 05/30/17 1413    Clinical Impression Statement  Pt requiring Min to Mod A at times with balance/coordination activities and balance/core strengthening activities. Impulsive/quick movements at times and momentum leans challenging. Pt 50/50 between being able to make corrective righting with min guard/Min A versus heavy Mod A to stabilize and then pt able to correct. Heavy work on balance today incorporating core and coordination. Plan to provide pt/spouse with written instruction on activities we discussed today.     Rehab Potential  Fair    PT Frequency  2x / week    PT Duration  8 weeks    PT Treatment/Interventions  ADLs/Self Care Home Management;Aquatic Therapy;Cryotherapy;Electrical  Stimulation;Iontophoresis 100m/ml Dexamethasone;Moist Heat;Ultrasound;Contrast Bath;DME Instruction;Gait training;Stair training;Functional mobility training;Therapeutic activities;Therapeutic exercise;Balance training;Neuromuscular re-education;Patient/family education;Manual techniques;Wheelchair mobility training;Energy conservation;Passive range of motion    PT Next Visit Plan  progress balance, strength, aquatic therapy    PT Home Exercise Plan  None provided at this time until further assessed, wife reports [facility]    Consulted and Agree with Plan of Care  Patient;Family member/caregiver    Family Member Consulted  wife       Patient will benefit from skilled therapeutic intervention in order to improve the following deficits and impairments:  Abnormal gait, Decreased balance, Decreased coordination, Decreased mobility, Impaired tone, Postural dysfunction  Visit Diagnosis: Muscle weakness (generalized)  Pain in joint of left shoulder  Spinal stenosis, lumbar region, with neurogenic claudication  Arthralgia of hip, unspecified laterality  Repeated falls  Unsteadiness on feet  Other abnormalities of gait and mobility  Unspecified lack of coordination  Other lack of coordination  Abnormal posture  Difficulty walking  Chronic midline low back pain without sciatica     Problem List Patient Active Problem List   Diagnosis Date Noted  . 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 Radicular pain in right arm 10/09/2016  . Rash 09/03/2016  . GERD (  gastroesophageal reflux disease) 07/31/2016  . Trochanteric bursitis 03/03/2016  . Left hand pain 10/30/2015  . Fracture, finger, multiple sites 10/30/2015  . Colon cancer screening 07/23/2015  . Healthcare maintenance 07/23/2015  . Gout 02/18/2015  . Depression 01/06/2015  . Irritation of eyelid 08/20/2014  . Dupuytren's contracture 08/20/2014  . Cough 07/21/2014  . Lumbar stenosis with  neurogenic claudication 06/13/2014  . Preop cardiovascular exam 05/28/2014  . S/P deep brain stimulator placement 05/08/2014  . Joint pain 01/22/2014  . Medicare annual wellness visit, initial 01/19/2014  . Advance care planning 01/19/2014  . Parkinson's disease ([place]) 12/13/2013  . PTSD (post-traumatic stress disorder) 06/13/2013  . Erectile dysfunction 06/13/2013  . HLD (hyperlipidemia) 06/13/2013  . Hip pain 06/10/2013  . Pain in joint, shoulder region 06/10/2013  . Right leg swelling 06/03/2013  . Essential hypertension 06/03/2013  . Bradycardia by electrocardiogram 06/03/2013  . Obstructive sleep apnea 03/12/2013  . Spinal stenosis, lumbar region, with neurogenic claudication 12/14/2012    Larae Grooms 05/30/2017, 2:24 PM  [place] MAIN [facility] SERVICES [place], [place], [address] Phone: [phone]   Fax:  [phone]  Name: [name] MRN: [id number] Date of Birth: 1951/02/01

## 2017-06-01 ENCOUNTER — Ambulatory Visit: Payer: Medicare PPO | Admitting: Psychology

## 2017-06-01 ENCOUNTER — Ambulatory Visit: Payer: Medicare PPO

## 2017-06-01 ENCOUNTER — Ambulatory Visit: Payer: Self-pay | Admitting: Psychology

## 2017-06-01 ENCOUNTER — Other Ambulatory Visit: Payer: Self-pay | Admitting: Cardiovascular Disease

## 2017-06-04 DIAGNOSIS — G20 Parkinson's disease: Secondary | ICD-10-CM | POA: Diagnosis not present

## 2017-06-04 DIAGNOSIS — G4733 Obstructive sleep apnea (adult) (pediatric): Secondary | ICD-10-CM | POA: Diagnosis not present

## 2017-06-05 ENCOUNTER — Encounter: Payer: Self-pay | Admitting: Internal Medicine

## 2017-06-06 ENCOUNTER — Ambulatory Visit: Payer: Medicare PPO

## 2017-06-06 DIAGNOSIS — R296 Repeated falls: Secondary | ICD-10-CM

## 2017-06-06 DIAGNOSIS — R2681 Unsteadiness on feet: Secondary | ICD-10-CM

## 2017-06-06 DIAGNOSIS — M25559 Pain in unspecified hip: Secondary | ICD-10-CM

## 2017-06-06 DIAGNOSIS — R279 Unspecified lack of coordination: Secondary | ICD-10-CM | POA: Diagnosis not present

## 2017-06-06 DIAGNOSIS — G8929 Other chronic pain: Secondary | ICD-10-CM

## 2017-06-06 DIAGNOSIS — R293 Abnormal posture: Secondary | ICD-10-CM | POA: Diagnosis not present

## 2017-06-06 DIAGNOSIS — R278 Other lack of coordination: Secondary | ICD-10-CM | POA: Diagnosis not present

## 2017-06-06 DIAGNOSIS — R262 Difficulty in walking, not elsewhere classified: Secondary | ICD-10-CM | POA: Diagnosis not present

## 2017-06-06 DIAGNOSIS — G20 Parkinson's disease: Secondary | ICD-10-CM

## 2017-06-06 DIAGNOSIS — R2689 Other abnormalities of gait and mobility: Secondary | ICD-10-CM | POA: Diagnosis not present

## 2017-06-06 DIAGNOSIS — M6281 Muscle weakness (generalized): Secondary | ICD-10-CM | POA: Diagnosis not present

## 2017-06-06 DIAGNOSIS — M545 Low back pain, unspecified: Secondary | ICD-10-CM

## 2017-06-07 DIAGNOSIS — G4733 Obstructive sleep apnea (adult) (pediatric): Secondary | ICD-10-CM | POA: Diagnosis not present

## 2017-06-07 DIAGNOSIS — G20 Parkinson's disease: Secondary | ICD-10-CM | POA: Diagnosis not present

## 2017-06-08 ENCOUNTER — Ambulatory Visit: Payer: Medicare PPO

## 2017-06-08 ENCOUNTER — Other Ambulatory Visit: Payer: Self-pay

## 2017-06-08 DIAGNOSIS — M6281 Muscle weakness (generalized): Secondary | ICD-10-CM

## 2017-06-08 DIAGNOSIS — R278 Other lack of coordination: Secondary | ICD-10-CM | POA: Diagnosis not present

## 2017-06-08 DIAGNOSIS — G20 Parkinson's disease: Secondary | ICD-10-CM

## 2017-06-08 DIAGNOSIS — R2689 Other abnormalities of gait and mobility: Secondary | ICD-10-CM

## 2017-06-08 DIAGNOSIS — M545 Low back pain, unspecified: Secondary | ICD-10-CM

## 2017-06-08 DIAGNOSIS — R296 Repeated falls: Secondary | ICD-10-CM | POA: Diagnosis not present

## 2017-06-08 DIAGNOSIS — R2681 Unsteadiness on feet: Secondary | ICD-10-CM

## 2017-06-08 DIAGNOSIS — R293 Abnormal posture: Secondary | ICD-10-CM | POA: Diagnosis not present

## 2017-06-08 DIAGNOSIS — G8929 Other chronic pain: Secondary | ICD-10-CM

## 2017-06-08 DIAGNOSIS — R279 Unspecified lack of coordination: Secondary | ICD-10-CM | POA: Diagnosis not present

## 2017-06-08 DIAGNOSIS — R262 Difficulty in walking, not elsewhere classified: Secondary | ICD-10-CM

## 2017-06-08 DIAGNOSIS — M25551 Pain in right hip: Secondary | ICD-10-CM

## 2017-06-08 NOTE — Therapy (Addendum)
[place] MAIN [facility] SERVICES [address] [place], [place], [address] Phone: [phone]   Fax:  [phone]  Physical Therapy Treatment  Patient Details  Name: [name] MRN: [id number] Date of Birth: 1950-12-20 Referring Provider: Alonza Bogus, DO   Encounter Date: 06/06/2017  PT End of Session - 06/08/17 1646    PT Start Time  0800    PT Stop Time  0845    PT Time Calculation (min)  45 min       Past Medical History:  Diagnosis Date  . Arthritis   . Bradycardia   . Cancer [facility]) 2013   skin cancer  . Depression    ptsd  . Dysrhythmia    chronic slow heart rate  . GERD (gastroesophageal reflux disease)   . Headache(784.0)    tension headaches non recent  . History of chicken pox   . History of kidney stones   . Hypertension    treated with HCTZ  . Parkinson's disease ([place])    dx'ed 15 years ago  . PTSD (post-traumatic stress disorder)   . Shortness of breath dyspnea   . Sleep apnea    doesn't use C-pap  . Varicose veins     Past Surgical History:  Procedure Laterality Date  . CHOLECYSTECTOMY N/A 10/22/2014   Procedure: LAPAROSCOPIC CHOLECYSTECTOMY WITH INTRAOPERATIVE CHOLANGIOGRAM;  Surgeon: Dia Crawford III, MD;  Location: [facility] ORS;  Service: General;  Laterality: N/A;  . cyst removed      from lip as a child  . LUMBAR LAMINECTOMY/DECOMPRESSION MICRODISCECTOMY Bilateral 12/14/2012   Procedure: Bilateral lumbar three-four, four-five decompressive laminotomy/foraminotomy;  Surgeon: Charlie Pitter, MD;  Location: [place] NEURO ORS;  Service: Neurosurgery;  Laterality: Bilateral;  . PULSE GENERATOR IMPLANT Bilateral 12/13/2013   Procedure: Bilateral implantable pulse generator placement;  Surgeon: Erline Levine, MD;  Location: [place] NEURO ORS;  Service: Neurosurgery;  Laterality: Bilateral;  Bilateral implantable pulse generator placement  . skin cancer removed     from ears,   12 lft arm  rt leg 15  . SUBTHALAMIC STIMULATOR INSERTION  Bilateral 12/06/2013   Procedure: SUBTHALAMIC STIMULATOR INSERTION;  Surgeon: Erline Levine, MD;  Location: [place] NEURO ORS;  Service: Neurosurgery;  Laterality: Bilateral;  Bilateral deep brain stimulator placement    There were no vitals filed for this visit.  Subjective Assessment - 06/08/17 1537    Subjective  No new voiced complaints this session. Pt's spouse notes that they will be getting his power chair today    Pertinent History  Patient referred for physical therapy due to wife's request for patient to get aquatic therapy to help with his conditioning. Pt is also complaining of chronic low back and R hip pain and frequent falls. 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 The last time he was in the water he was very uncomfortable because he didn't feel like he had any control. Pt has a history of 2 low back surgeries in August 2014 and February 2016. Pt and wife are unable to recall exactly what was performed but they know that he had stenosis. Surgeries were performed by Dr. Annette Stable in [place]. Pt reports that his low back and R hip pain are constant. He describes the pain in the back as "dull" and the pain in the hip as "sharp." For both back and hip worst pain is 10/10, Best: 2/10, Present: 2/10. No particular episode of trauma  to his low back or hip preceding the pain. Pt has a history of Parkinson's with a deep brain stimulator. He ambulates with a 4 wheeled walker called a U-step (automatic brakes, audio cues for stepping). He states that he falls frequently and reports that he has had approximately 40-50 falls in the last 6 months. No specific recent changes in his health.       Enters/exits the pool via ramp Participates in the following  Ambulation with KB - 4 L fwd - 2 L side - 2 L side with squat  Balance/strength, no UE support - Stepping side lunges; B 20x each - Stepping fwd lunges; B 20x each - STS from bench, no UE  support in turbulent water  Balance - Alternating toe taps on step, 2 x 1 min                        PT Education - 06/08/17 1448    Education provided  Yes    Education Details  Discussed option with pt and spouse for improved safety in the bathroom such as sitting if balance feels worse for activities that pt would normally stand for, grab bars, raised toilet frame with arm rests.     Person(s) Educated  Patient;Spouse       PT Short Term Goals - 05/25/17 1328      PT SHORT TERM GOAL #1   Title  Pt will perform HEP with family's supervision, for improved balance, transfers, and gait.      Baseline  patient reports compliance, however not every day    Time  4    Period  Weeks    Status  Partially Met      PT SHORT TERM GOAL #2   Title  Pt will perform 5x sit<>stand in less than or equal to 15 seconds for improved efficiency and safety with transfers.    Baseline  19 seconds    Time  2    Period  Weeks    Status  On-going        PT Long Term Goals - 05/25/17 1328      PT LONG TERM GOAL #1   Title  Pt will decrease 5TSTS by at least 3 seconds in order to demonstrate clinically significant improvement in LE strength     Baseline  02/08/17: 20.1 seconds 12/18: 17 seconds     Time  8    Period  Weeks    Status  Achieved      PT LONG TERM GOAL #2   Title  Pt will decrease TUG by at least 3.4s in order to demonstrate decreased fall risk     Baseline  02/08/17: 22.1s 12/18: 20 seconds 2/7: 19 seconds    Time  8    Period  Weeks    Status  Partially Met      PT LONG TERM GOAL #3   Title  Pt will improve BERG by at least 3 points in order to demonstrate clinically significant improvement in balance.    Baseline  02/08/17: 30/56 12/18: 38/56      Time  8    Period  Weeks    Status  Achieved      PT LONG TERM GOAL #4   Title  Pt/wife will verbalize understanding of plans for continued community fitness upon D/C from PT including aquatic therapy  program    Baseline  02/08/17: pt currently reports being more comfortable  in the water    Time  8    Period  Weeks    Status  Partially Met      PT LONG TERM GOAL #5   Title  Pt will decrease mODI scoreby at least 13 points in order demonstrate clinically significant reduction in pain/disability     Baseline  02/08/17: 54% 12/18: 46% 2/7: 42%    Time  8    Period  Weeks    Status  Partially Met      PT LONG TERM GOAL #6   Title  Pt will improve BERG to 44/56  in order to demonstrate clinically significant improvement in balance for decreased fall risk    Baseline  12/18: 38/56 2/7: 41/56    Time  8    Period  Weeks    Status  Partially Met            Plan - 06/08/17 1540    Rehab Potential  Fair    PT Frequency  2x / week    PT Duration  8 weeks    PT Treatment/Interventions  ADLs/Self Care Home Management;Aquatic Therapy;Cryotherapy;Electrical Stimulation;Iontophoresis 82m/ml Dexamethasone;Moist Heat;Ultrasound;Contrast Bath;DME Instruction;Gait training;Stair training;Functional mobility training;Therapeutic activities;Therapeutic exercise;Balance training;Neuromuscular re-education;Patient/family education;Manual techniques;Wheelchair mobility training;Energy conservation;Passive range of motion    PT Next Visit Plan  progress balance, strength, aquatic therapy    PT Home Exercise Plan  None provided at this time until further assessed, wife reports [facility]    Consulted and Agree with Plan of Care  Patient;Family member/caregiver    Family Member Consulted  wife       Patient will benefit from skilled therapeutic intervention in order to improve the following deficits and impairments:  Abnormal gait, Decreased balance, Decreased coordination, Decreased mobility, Impaired tone, Postural dysfunction  Visit Diagnosis: Arthralgia of hip, unspecified laterality  Muscle weakness (generalized)  Repeated falls  Unsteadiness on  feet  Other abnormalities of gait and mobility  Unspecified lack of coordination  Other lack of coordination  Abnormal posture  Difficulty walking  Chronic midline low back pain without sciatica  Parkinson's disease ([place]     Problem List Patient Active Problem List   Diagnosis Date Noted  . 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 Radicular pain in right arm 10/09/2016  . Rash 09/03/2016  . GERD (gastroesophageal reflux disease) 07/31/2016  . Trochanteric bursitis 03/03/2016  . Left hand pain 10/30/2015  . Fracture, finger, multiple sites 10/30/2015  . Colon cancer screening 07/23/2015  . Healthcare maintenance 07/23/2015  . Gout 02/18/2015  . Depression 01/06/2015  . Irritation of eyelid 08/20/2014  . Dupuytren's contracture 08/20/2014  . Cough 07/21/2014  . Lumbar stenosis with neurogenic claudication 06/13/2014  . Preop cardiovascular exam 05/28/2014  . S/P deep brain stimulator placement 05/08/2014  . Joint pain 01/22/2014  . Medicare annual wellness visit, initial 01/19/2014  . Advance care planning 01/19/2014  . Parkinson's disease ([place] 12/13/2013  . PTSD (post-traumatic stress disorder) 06/13/2013  . Erectile dysfunction 06/13/2013  . HLD (hyperlipidemia) 06/13/2013  . Hip pain 06/10/2013  . Pain in joint, shoulder region 06/10/2013  . Right leg swelling 06/03/2013  . Essential hypertension 06/03/2013  . Bradycardia by electrocardiogram 06/03/2013  . Obstructive sleep apnea 03/12/2013  . Spinal stenosis, lumbar region, with neurogenic claudication 12/14/2012    HLarae Grooms2/21/2019, 4:46 PM  [place] [address] [place] [address]Phone: [phone]  Fax:  [phone]  Name: [name] MRN: [id number] Date of Birth: [birth year]

## 2017-06-08 NOTE — Therapy (Addendum)
[place] MAIN [facility] SERVICES [address] [place], [place], [address] Phone: [phone]   Fax:  [phone]  Physical Therapy Treatment  Patient Details  Name: [name] MRN: [id number] Date of Birth: Aug 22, 1950 Referring Provider: Alonza Bogus, DO   Encounter Date: 06/08/2017  PT End of Session - 06/08/17 1633    Visit Number  25    Number of Visits  49    Date for PT Re-Evaluation  07/20/17    PT Start Time  0940    PT Stop Time  1025    PT Time Calculation (min)  45 min       Past Medical History:  Diagnosis Date  . Arthritis   . Bradycardia   . Cancer [facility]) 2013   skin cancer  . Depression    ptsd  . Dysrhythmia    chronic slow heart rate  . GERD (gastroesophageal reflux disease)   . Headache(784.0)    tension headaches non recent  . History of chicken pox   . History of kidney stones   . Hypertension    treated with HCTZ  . Parkinson's disease ([place])    dx'ed 15 years ago  . PTSD (post-traumatic stress disorder)   . Shortness of breath dyspnea   . Sleep apnea    doesn't use C-pap  . Varicose veins     Past Surgical History:  Procedure Laterality Date  . CHOLECYSTECTOMY N/A 10/22/2014   Procedure: LAPAROSCOPIC CHOLECYSTECTOMY WITH INTRAOPERATIVE CHOLANGIOGRAM;  Surgeon: Dia Crawford III, MD;  Location: [facility] ORS;  Service: General;  Laterality: N/A;  . cyst removed      from lip as a child  . LUMBAR LAMINECTOMY/DECOMPRESSION MICRODISCECTOMY Bilateral 12/14/2012   Procedure: Bilateral lumbar three-four, four-five decompressive laminotomy/foraminotomy;  Surgeon: Charlie Pitter, MD;  Location: [place] NEURO ORS;  Service: Neurosurgery;  Laterality: Bilateral;  . PULSE GENERATOR IMPLANT Bilateral 12/13/2013   Procedure: Bilateral implantable pulse generator placement;  Surgeon: Erline Levine, MD;  Location: [place] NEURO ORS;  Service: Neurosurgery;  Laterality: Bilateral;  Bilateral implantable pulse generator placement  . skin  cancer removed     from ears,   12 lft arm  rt leg 15  . SUBTHALAMIC STIMULATOR INSERTION Bilateral 12/06/2013   Procedure: SUBTHALAMIC STIMULATOR INSERTION;  Surgeon: Erline Levine, MD;  Location: [place] NEURO ORS;  Service: Neurosurgery;  Laterality: Bilateral;  Bilateral deep brain stimulator placement    There were no vitals filed for this visit.  Subjective Assessment - 06/08/17 1537    Subjective  No new voiced complaints this session. Pt's spouse notes that they will be getting his power chair today    Pertinent History  Patient referred for physical therapy due to wife's request for patient to get aquatic therapy to help with his conditioning. Pt is also complaining of chronic low back and R hip pain and frequent falls. 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 The last time he was in the water he was very uncomfortable because he didn't feel like he had any control. Pt has a history of 2 low back surgeries in August 2014 and February 2016. Pt and wife are unable to recall exactly what was performed but they know that he had stenosis. Surgeries were performed by Dr. Annette Stable in [place]. Pt reports that his low back and R hip pain are constant. He describes the pain in the back as "dull" and the  pain in the hip as "sharp." For both back and hip worst pain is 10/10, Best: 2/10, Present: 2/10. No particular episode of trauma to his low back or hip preceding the pain. Pt has a history of Parkinson's with a deep brain stimulator. He ambulates with a 4 wheeled walker called a U-step (automatic brakes, audio cues for stepping). He states that he falls frequently and reports that he has had approximately 40-50 falls in the last 6 months. No specific recent changes in his health.       Pt enters/exits pool via ramp Participates in the following  Ambulation, KB for UE support - 4 L fwd - 2 L side - 2 L side with squat - 2 L bkwd  Stand core with  LE strengthening, 25x ea - Hip abd/add - Hip flex/ext - Deep squats, no UE support, 30x - Step up with same side slow eccentric return, 20x eac  Balance - Alternating toe taps, no UE support 30x each                         PT Education - 06/08/17 1448    Education provided  Yes    Education Details  Discussed option with pt and spouse for improved safety in the bathroom such as sitting if balance feels worse for activities that pt would normally stand for, grab bars, raised toilet frame with arm rests.     Person(s) Educated  Patient;Spouse       PT Short Term Goals - 05/25/17 1328      PT SHORT TERM GOAL #1   Title  Pt will perform HEP with family's supervision, for improved balance, transfers, and gait.      Baseline  patient reports compliance, however not every day    Time  4    Period  Weeks    Status  Partially Met      PT SHORT TERM GOAL #2   Title  Pt will perform 5x sit<>stand in less than or equal to 15 seconds for improved efficiency and safety with transfers.    Baseline  19 seconds    Time  2    Period  Weeks    Status  On-going        PT Long Term Goals - 05/25/17 1328      PT LONG TERM GOAL #1   Title  Pt will decrease 5TSTS by at least 3 seconds in order to demonstrate clinically significant improvement in LE strength     Baseline  02/08/17: 20.1 seconds 12/18: 17 seconds     Time  8    Period  Weeks    Status  Achieved      PT LONG TERM GOAL #2   Title  Pt will decrease TUG by at least 3.4s in order to demonstrate decreased fall risk     Baseline  02/08/17: 22.1s 12/18: 20 seconds 2/7: 19 seconds    Time  8    Period  Weeks    Status  Partially Met      PT LONG TERM GOAL #3   Title  Pt will improve BERG by at least 3 points in order to demonstrate clinically significant improvement in balance.    Baseline  02/08/17: 30/56 12/18: 38/56      Time  8    Period  Weeks    Status  Achieved      PT LONG TERM GOAL #4  Title   Pt/wife will verbalize understanding of plans for continued community fitness upon D/C from PT including aquatic therapy program    Baseline  02/08/17: pt currently reports being more comfortable in the water    Time  8    Period  Weeks    Status  Partially Met      PT LONG TERM GOAL #5   Title  Pt will decrease mODI scoreby at least 13 points in order demonstrate clinically significant reduction in pain/disability     Baseline  02/08/17: 54% 12/18: 46% 2/7: 42%    Time  8    Period  Weeks    Status  Partially Met      PT LONG TERM GOAL #6   Title  Pt will improve BERG to 44/56  in order to demonstrate clinically significant improvement in balance for decreased fall risk    Baseline  12/18: 38/56 2/7: 41/56    Time  8    Period  Weeks    Status  Partially Met            Plan - 06/08/17 1540    Rehab Potential  Fair    PT Frequency  2x / week    PT Duration  8 weeks    PT Treatment/Interventions  ADLs/Self Care Home Management;Aquatic Therapy;Cryotherapy;Electrical Stimulation;Iontophoresis 67m/ml Dexamethasone;Moist Heat;Ultrasound;Contrast Bath;DME Instruction;Gait training;Stair training;Functional mobility training;Therapeutic activities;Therapeutic exercise;Balance training;Neuromuscular re-education;Patient/family education;Manual techniques;Wheelchair mobility training;Energy conservation;Passive range of motion    PT Next Visit Plan  progress balance, strength, aquatic therapy    PT Home Exercise Plan  None provided at this time until further assessed, wife reports pt [facility]    Consulted and Agree with Plan of Care  Patient;Family member/caregiver    Family Member Consulted  wife       Patient will benefit from skilled therapeutic intervention in order to improve the following deficits and impairments:  Abnormal gait, Decreased balance, Decreased coordination, Decreased mobility, Impaired tone, Postural dysfunction  Visit  Diagnosis: Muscle weakness (generalized)  Repeated falls  Unsteadiness on feet  Other abnormalities of gait and mobility  Unspecified lack of coordination  Other lack of coordination  Abnormal posture  Difficulty walking  Chronic midline low back pain without sciatica  Parkinson's disease ([facility])  Pain in right hip     Problem List Patient Active Problem List   Diagnosis Date Noted  . 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 Radicular pain in right arm 10/09/2016  . Rash 09/03/2016  . GERD (gastroesophageal reflux disease) 07/31/2016  . Trochanteric bursitis 03/03/2016  . Left hand pain 10/30/2015  . Fracture, finger, multiple sites 10/30/2015  . Colon cancer screening 07/23/2015  . Healthcare maintenance 07/23/2015  . Gout 02/18/2015  . Depression 01/06/2015  . Irritation of eyelid 08/20/2014  . Dupuytren's contracture 08/20/2014  . Cough 07/21/2014  . Lumbar stenosis with neurogenic claudication 06/13/2014  . Preop cardiovascular exam 05/28/2014  . S/P deep brain stimulator placement 05/08/2014  . Joint pain 01/22/2014  . Medicare annual wellness visit, initial 01/19/2014  . Advance care planning 01/19/2014  . Parkinson's disease ([place] 12/13/2013  . PTSD (post-traumatic stress disorder) 06/13/2013  . Erectile dysfunction 06/13/2013  . HLD (hyperlipidemia) 06/13/2013  . Hip pain 06/10/2013  . Pain in joint, shoulder region 06/10/2013  . Right leg swelling 06/03/2013  . Essential hypertension 06/03/2013  . Bradycardia by electrocardiogram 06/03/2013  . Obstructive sleep apnea 03/12/2013  . Spinal stenosis, lumbar region, with  neurogenic claudication 12/14/2012    Larae Grooms 06/08/2017, 4:39 PM   [facility] MAIN [facility] SERVICES [address] [place], [place], [address] Phone: [phone]   Fax:  [phone]  Name: [name] MRN: [id number] Date of Birth: 1951/02/15

## 2017-06-11 ENCOUNTER — Other Ambulatory Visit: Payer: Self-pay

## 2017-06-11 ENCOUNTER — Encounter: Payer: Self-pay | Admitting: Emergency Medicine

## 2017-06-11 ENCOUNTER — Ambulatory Visit
Admission: EM | Admit: 2017-06-11 | Discharge: 2017-06-11 | Disposition: A | Discharge status: Home / Self Care | Payer: Medicare PPO | Attending: Family Medicine | Admitting: Family Medicine

## 2017-06-11 ENCOUNTER — Ambulatory Visit (INDEPENDENT_AMBULATORY_CARE_PROVIDER_SITE_OTHER): Payer: Medicare PPO

## 2017-06-11 DIAGNOSIS — M7752 Other enthesopathy of left foot: Secondary | ICD-10-CM

## 2017-06-11 DIAGNOSIS — M71572 Other bursitis, not elsewhere classified, left ankle and foot: Secondary | ICD-10-CM | POA: Diagnosis not present

## 2017-06-11 DIAGNOSIS — M7989 Other specified soft tissue disorders: Secondary | ICD-10-CM | POA: Diagnosis not present

## 2017-06-11 DIAGNOSIS — M79672 Pain in left foot: Secondary | ICD-10-CM | POA: Diagnosis not present

## 2017-06-11 MED ORDER — NAPROXEN 500 MG PO TABS: 500.0000 mg | ORAL_TABLET | Freq: Two times a day (BID) | ORAL | 0 refills | Status: DC

## 2017-06-11 NOTE — ED Triage Notes (Signed)
Patient c/o pain in his left heel of his foot since Thursday.  Patient denies fall.

## 2017-06-11 NOTE — ED Provider Notes (Signed)
[facility]    CSN: [id number] Arrival date & time: 06/11/17  1117  History   Chief Complaint Chief Complaint  Patient presents with  . Foot Pain   HPI  67 year old male presents with heel pain.  Started on Thursday after he had difficulty putting on his shoe.  Patient has Parkinson's disease and falls frequently.  He has fallen recently.  Pain is moderate in severity.  He is having difficulty walking.  Exacerbated by activity.  Relieved with rest.  No reports of ankle pain or pain in any other areas of his foot.  No medications or interventions tried.  No other associated symptoms.  No other complaints.  Past Medical History:  Diagnosis Date  . Arthritis   . Bradycardia   . Cancer [facility]) 2013   skin cancer  . Depression    ptsd  . Dysrhythmia    chronic slow heart rate  . GERD (gastroesophageal reflux disease)   . Headache(784.0)    tension headaches non recent  . History of chicken pox   . History of kidney stones   . Hypertension    treated with HCTZ  . Parkinson's disease ([place])    dx'ed 15 years ago  . PTSD (post-traumatic stress disorder)   . Shortness of breath dyspnea   . Sleep apnea    doesn't use C-pap  . Varicose veins     Patient Active Problem List   Diagnosis Date Noted  . 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 Radicular pain in right arm 10/09/2016  . Rash 09/03/2016  . GERD (gastroesophageal reflux disease) 07/31/2016  . Trochanteric bursitis 03/03/2016  . Left hand pain 10/30/2015  . Fracture, finger, multiple sites 10/30/2015  . Colon cancer screening 07/23/2015  . Healthcare maintenance 07/23/2015  . Gout 02/18/2015  . Depression 01/06/2015  . Irritation of eyelid 08/20/2014  . Dupuytren's contracture 08/20/2014  . Cough 07/21/2014  . Lumbar stenosis with neurogenic claudication 06/13/2014  . Preop cardiovascular exam 05/28/2014  . S/P deep brain stimulator placement 05/08/2014  . Joint pain 01/22/2014  . Medicare annual  wellness visit, initial 01/19/2014  . Advance care planning 01/19/2014  . Parkinson's disease ([place]) 12/13/2013  . PTSD (post-traumatic stress disorder) 06/13/2013  . Erectile dysfunction 06/13/2013  . HLD (hyperlipidemia) 06/13/2013  . Hip pain 06/10/2013  . Pain in joint, shoulder region 06/10/2013  . Right leg swelling 06/03/2013  . Essential hypertension 06/03/2013  . Bradycardia by electrocardiogram 06/03/2013  . Obstructive sleep apnea 03/12/2013  . Spinal stenosis, lumbar region, with neurogenic claudication 12/14/2012    Past Surgical History:  Procedure Laterality Date  . CHOLECYSTECTOMY N/A 10/22/2014   Procedure: LAPAROSCOPIC CHOLECYSTECTOMY WITH INTRAOPERATIVE CHOLANGIOGRAM;  Surgeon: Dia Crawford III, MD;  Location: [facility] ORS;  Service: General;  Laterality: N/A;  . cyst removed      from lip as a child  . LUMBAR LAMINECTOMY/DECOMPRESSION MICRODISCECTOMY Bilateral 12/14/2012   Procedure: Bilateral lumbar three-four, four-five decompressive laminotomy/foraminotomy;  Surgeon: Charlie Pitter, MD;  Location: [place] NEURO ORS;  Service: Neurosurgery;  Laterality: Bilateral;  . PULSE GENERATOR IMPLANT Bilateral 12/13/2013   Procedure: Bilateral implantable pulse generator placement;  Surgeon: Erline Levine, MD;  Location: [place] NEURO ORS;  Service: Neurosurgery;  Laterality: Bilateral;  Bilateral implantable pulse generator placement  . skin cancer removed     from ears,   12 lft arm  rt leg 15  . SUBTHALAMIC STIMULATOR INSERTION Bilateral 12/06/2013   Procedure: SUBTHALAMIC STIMULATOR INSERTION;  Surgeon: Erline Levine, MD;  Location: [place] NEURO ORS;  Service: Neurosurgery;  Laterality: Bilateral;  Bilateral deep brain stimulator placement   Home Medications    Prior to Admission medications   Medication Sig Start Date End Date Taking? Authorizing Provider  buPROPion (WELLBUTRIN XL) 150 MG 24 hr tablet Take 3 tablets (450 mg total) by mouth daily. 07/29/16  Yes Tonia Ghent, MD    carbidopa-levodopa (SINEMET IR) 25-100 MG tablet Take 1 tablet by mouth See admin instructions. 2 AM/ 1 afternoon/ 1 evening Patient taking differently: Take two by mouth every morning and one in the pm 12/27/16  Yes Tat, Eustace Quail, DO  cholecalciferol (VITAMIN D) 1000 UNITS tablet Take 1,000 Units by mouth daily.   Yes [provider]  escitalopram (LEXAPRO) 10 MG tablet Take 1 tablet (10 mg total) by mouth daily. 07/29/16  Yes Tonia Ghent, MD  hydrOXYzine (ATARAX/VISTARIL) 10 MG tablet Take 10 mg by mouth at bedtime. 09/27/16  Yes [provider]  memantine (NAMENDA) 10 MG tablet TAKE 1 TABLET TWICE A DAY 05/11/17  Yes Tat, Eustace Quail, DO  NONFORMULARY OR COMPOUNDED ITEM Apply 1 application topically daily. Triamcinolone .25%/Cerave 1:1   Yes [provider]  omeprazole (PRILOSEC) 20 MG capsule TAKE 1 CAPSULE DAILY 01/09/17  Yes Tonia Ghent, MD  pravastatin (PRAVACHOL) 20 MG tablet TAKE 1 TABLET EVERY EVENING 06/01/17  Yes Gollan, Kathlene November, MD  naproxen (NAPROSYN) 500 MG tablet Take 1 tablet (500 mg total) by mouth 2 (two) times daily. 06/11/17   Coral Spikes, DO    Family History Family History  Problem Relation Age of Onset  . Lung cancer Father   . Colon cancer Neg Hx   . Prostate cancer Neg Hx     Social History Social History   Tobacco Use  . Smoking status: Never Smoker  . Smokeless tobacco: Former Network engineer Use Topics  . Alcohol use: Yes    Alcohol/week: 0.0 oz    Comment: occasional (twice per month)  . Drug use: No     Allergies   Klonopin [clonazepam]; Morphine and related; and Prednisone   Review of Systems Review of Systems  Constitutional: Negative.   Musculoskeletal:       Left heel pain.  Neurological:       Frequent falls.   Physical Exam Triage Vital Signs ED Triage Vitals  Enc Vitals Group     BP 06/11/17 1128 127/69     Pulse Rate 06/11/17 1128 (!) 51     Resp 06/11/17 1128 16     Temp 06/11/17 1128 97.6  F (36.4 C)     Temp Source 06/11/17 1128 Oral     SpO2 06/11/17 1128 100 %     Weight 06/11/17 1125 192 lb (87.1 kg)     Height 06/11/17 1125 5\' 8"  (1.727 m)     Head Circumference --      Peak Flow --      Pain Score 06/11/17 1125 7     Pain Loc --      Pain Edu? --      Excl. in [place]? --    Updated Vital Signs BP 127/69 (BP Location: Left Arm)   Pulse (!) 51   Temp 97.6 F (36.4 C) (Oral)   Resp 16   Ht 5\' 8"  (1.727 m)   Wt 192 lb (87.1 kg)   SpO2 100%   BMI 29.19 kg/m   Visual Acuity Right Eye Distance:   Left Eye  Distance:   Bilateral Distance:    Right Eye Near:   Left Eye Near:    Bilateral Near:     Physical Exam  Constitutional: He is oriented to person, place, and time. He appears well-developed. No distress.  Cardiovascular: Regular rhythm.  Bradycardic.  Pulmonary/Chest: Effort normal and breath sounds normal.  Musculoskeletal:  Left foot/heel -patient with exquisite tenderness to palpation around the attachment site of the Achilles.  No other areas of tenderness appreciated on exam.  Neurological: He is alert and oriented to person, place, and time.  Tremor noted.  Psychiatric: He has a normal mood and affect. His behavior is normal.  Nursing note and vitals reviewed.  UC Treatments / Results  Labs (all labs ordered are listed, but only abnormal results are displayed) Labs Reviewed - No data to display  EKG  EKG Interpretation None       Radiology Dg Os Calcis Left  Result Date: 06/11/2017 CLINICAL DATA:  Posterior heel pain.  No trauma. EXAM: LEFT OS CALCIS - 2+ VIEW COMPARISON:  None. FINDINGS: Soft tissue edema and swelling is seen over the posterior calcaneus. An enthesophyte at the Achilles insertion site is identified. There is a tiny plantar spur. No calcaneal fracture or bony lesion. IMPRESSION: Soft tissue swelling of the calcaneus. Enthesophyte at the Achilles insertion site. Small plantar spur. No fracture identified. Electronically  Signed   By: Dorise Bullion III M.D   On: 06/11/2017 12:03    Procedures Procedures (including critical care time)  Medications Ordered in UC Medications - No data to display   Initial Impression / Assessment and Plan / UC Course  I have reviewed the triage vital signs and the nursing notes.  Pertinent labs & imaging results that were available during my care of the patient were reviewed by me and considered in my medical decision making (see chart for details).     67 year old male presents with Achilles enthesopathy versus calcaneal bursitis.  Treating with naproxen.  Patient requested short duration of a boot.  I informed him that this was okay only for a few days.  Rest, elevation, ice.  Naproxen as prescribed.  If persists, will need follow-up with orthopedics.  Final Clinical Impressions(s) / UC Diagnoses   Final diagnoses:  Left calcaneal bursitis    ED Discharge Orders        Ordered    naproxen (NAPROSYN) 500 MG tablet  2 times daily     06/11/17 1209     Controlled Substance Prescriptions [place] Controlled Substance Registry consulted? Not Applicable   Coral Spikes, DO 06/11/17 1226

## 2017-06-11 NOTE — Discharge Instructions (Signed)
Rest.  Naproxen as directed over the next 7-10 days.  Take care

## 2017-06-13 ENCOUNTER — Ambulatory Visit (INDEPENDENT_AMBULATORY_CARE_PROVIDER_SITE_OTHER): Payer: Medicare PPO | Admitting: Psychology

## 2017-06-13 ENCOUNTER — Ambulatory Visit: Payer: Medicare PPO

## 2017-06-13 DIAGNOSIS — F331 Major depressive disorder, recurrent, moderate: Secondary | ICD-10-CM

## 2017-06-13 DIAGNOSIS — F4323 Adjustment disorder with mixed anxiety and depressed mood: Secondary | ICD-10-CM | POA: Diagnosis not present

## 2017-06-14 ENCOUNTER — Telehealth: Payer: Self-pay | Admitting: Emergency Medicine

## 2017-06-14 NOTE — Telephone Encounter (Signed)
Called to follow up after patient's recent visit. Patient states that he is improving.

## 2017-06-15 ENCOUNTER — Ambulatory Visit: Payer: Self-pay | Admitting: Psychology

## 2017-06-20 ENCOUNTER — Other Ambulatory Visit: Payer: Self-pay

## 2017-06-20 ENCOUNTER — Ambulatory Visit: Payer: Medicare PPO | Attending: Neurology

## 2017-06-20 DIAGNOSIS — R278 Other lack of coordination: Secondary | ICD-10-CM

## 2017-06-20 DIAGNOSIS — G8929 Other chronic pain: Secondary | ICD-10-CM | POA: Insufficient documentation

## 2017-06-20 DIAGNOSIS — R262 Difficulty in walking, not elsewhere classified: Secondary | ICD-10-CM | POA: Diagnosis not present

## 2017-06-20 DIAGNOSIS — R279 Unspecified lack of coordination: Secondary | ICD-10-CM | POA: Diagnosis not present

## 2017-06-20 DIAGNOSIS — M545 Low back pain, unspecified: Secondary | ICD-10-CM

## 2017-06-20 DIAGNOSIS — R293 Abnormal posture: Secondary | ICD-10-CM | POA: Diagnosis not present

## 2017-06-20 DIAGNOSIS — R2689 Other abnormalities of gait and mobility: Secondary | ICD-10-CM

## 2017-06-20 DIAGNOSIS — M6281 Muscle weakness (generalized): Secondary | ICD-10-CM | POA: Diagnosis not present

## 2017-06-20 DIAGNOSIS — M25559 Pain in unspecified hip: Secondary | ICD-10-CM

## 2017-06-20 DIAGNOSIS — G20 Parkinson's disease: Secondary | ICD-10-CM | POA: Diagnosis present

## 2017-06-20 DIAGNOSIS — M25551 Pain in right hip: Secondary | ICD-10-CM

## 2017-06-20 DIAGNOSIS — R296 Repeated falls: Secondary | ICD-10-CM | POA: Diagnosis not present

## 2017-06-20 DIAGNOSIS — R2681 Unsteadiness on feet: Secondary | ICD-10-CM | POA: Diagnosis not present

## 2017-06-20 DIAGNOSIS — G20A1 Parkinson's disease without dyskinesia, without mention of fluctuations: Secondary | ICD-10-CM

## 2017-06-20 NOTE — Therapy (Signed)
[place] MAIN [facility] SERVICES [address] [place], [place], [address] Phone: [phone]   Fax:  [phone]  Physical Therapy Treatment  Patient Details  Name: [name] MRN: [id number] Date of Birth: 27-Jul-1950 Referring Provider: Alonza Bogus, DO   Encounter Date: 06/20/2017  PT End of Session - 06/20/17 1445    Visit Number  26    Number of Visits  25    Date for PT Re-Evaluation  07/20/17    PT Start Time  0935    PT Stop Time  1025    PT Time Calculation (min)  50 min    Activity Tolerance  Patient tolerated treatment well;Treatment limited secondary to medical complications (Comment)    Behavior During Therapy  [facility] for tasks assessed/performed       Past Medical History:  Diagnosis Date  . Arthritis   . Bradycardia   . Cancer [place]) 2013   skin cancer  . Depression    ptsd  . Dysrhythmia    chronic slow heart rate  . GERD (gastroesophageal reflux disease)   . Headache(784.0)    tension headaches non recent  . History of chicken pox   . History of kidney stones   . Hypertension    treated with HCTZ  . Parkinson's disease ([place])    dx'ed 15 years ago  . PTSD (post-traumatic stress disorder)   . Shortness of breath dyspnea   . Sleep apnea    doesn't use C-pap  . Varicose veins     Past Surgical History:  Procedure Laterality Date  . CHOLECYSTECTOMY N/A 10/22/2014   Procedure: LAPAROSCOPIC CHOLECYSTECTOMY WITH INTRAOPERATIVE CHOLANGIOGRAM;  Surgeon: Dia Crawford III, MD;  Location: [facility] ORS;  Service: General;  Laterality: N/A;  . cyst removed      from lip as a child  . LUMBAR LAMINECTOMY/DECOMPRESSION MICRODISCECTOMY Bilateral 12/14/2012   Procedure: Bilateral lumbar three-four, four-five decompressive laminotomy/foraminotomy;  Surgeon: Charlie Pitter, MD;  Location: [place] NEURO ORS;  Service: Neurosurgery;  Laterality: Bilateral;  . PULSE GENERATOR IMPLANT Bilateral 12/13/2013   Procedure: Bilateral implantable pulse  generator placement;  Surgeon: Erline Levine, MD;  Location: [place] NEURO ORS;  Service: Neurosurgery;  Laterality: Bilateral;  Bilateral implantable pulse generator placement  . skin cancer removed     from ears,   12 lft arm  rt leg 15  . SUBTHALAMIC STIMULATOR INSERTION Bilateral 12/06/2013   Procedure: SUBTHALAMIC STIMULATOR INSERTION;  Surgeon: Erline Levine, MD;  Location: [place] NEURO ORS;  Service: Neurosurgery;  Laterality: Bilateral;  Bilateral deep brain stimulator placement    There were no vitals filed for this visit.  Subjective Assessment - 06/20/17 1442    Subjective  Pt/spouse note that pt fell last week "bruising his Achilles tendon on the L" diagnosed through MD visit. Pt currently wearing a walking boot. Pt/spouse note MD stated to only wear for a few days. Pt/spouse also report several other falls recently; 1 causing Mod R lateral lower rib cage pain.     Pertinent History  Patient referred for physical therapy due to wife's request for patient to get aquatic therapy to help with his conditioning. Pt is also complaining of chronic low back and R hip pain and frequent falls. 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 The last time he was in the water he was very uncomfortable because he didn't feel like he had any control. Pt has a  history of 2 low back surgeries in August 2014 and February 2016. Pt and wife are unable to recall exactly what was performed but they know that he had stenosis. Surgeries were performed by Dr. Annette Stable in [place]. Pt reports that his low back and R hip pain are constant. He describes the pain in the back as "dull" and the pain in the hip as "sharp." For both back and hip worst pain is 10/10, Best: 2/10, Present: 2/10. No particular episode of trauma to his low back or hip preceding the pain. Pt has a history of Parkinson's with a deep brain stimulator. He ambulates with a 4 wheeled walker called a U-step  (automatic brakes, audio cues for stepping). He states that he falls frequently and reports that he has had approximately 40-50 falls in the last 6 months. No specific recent changes in his health.       Enters/exits via ramp with CGA Participates in the following  Ambulation with blue dumbbells for UE support  4 L fwd  4 L side  Bench seated   L ankle arom, 30x ea   Flex/ext   Inv/ever   Cw/ccw circles Passive L gastroc stretch Strength with abdominal stabilization, 3 min ea  Bike  Scissor  Flutter  Rail, stand strengthening  B hip ext, 20x ea  Squats with UE support  Balance  Perturbations, Mod, feet together  Static resisted isometric trunk turns  R SLS, 5 trials                        PT Education - 06/20/17 1444    Education provided  Yes    Education Details  Educated on L ankle rom exercises as well as use of ice and elevation for swelling. Educated on wearing smaller tie up brace for support with ambulation as needed, but to cease use of the walking boot.     Person(s) Educated  Patient;Spouse       PT Short Term Goals - 05/25/17 1328      PT SHORT TERM GOAL #1   Title  Pt will perform HEP with family's supervision, for improved balance, transfers, and gait.      Baseline  patient reports compliance, however not every day    Time  4    Period  Weeks    Status  Partially Met      PT SHORT TERM GOAL #2   Title  Pt will perform 5x sit<>stand in less than or equal to 15 seconds for improved efficiency and safety with transfers.    Baseline  19 seconds    Time  2    Period  Weeks    Status  On-going        PT Long Term Goals - 05/25/17 1328      PT LONG TERM GOAL #1   Title  Pt will decrease 5TSTS by at least 3 seconds in order to demonstrate clinically significant improvement in LE strength     Baseline  02/08/17: 20.1 seconds 12/18: 17 seconds     Time  8    Period  Weeks    Status  Achieved      PT LONG TERM GOAL #2   Title   Pt will decrease TUG by at least 3.4s in order to demonstrate decreased fall risk     Baseline  02/08/17: 22.1s 12/18: 20 seconds 2/7: 19 seconds    Time  8    Period  Weeks    Status  Partially Met      PT LONG TERM GOAL #3   Title  Pt will improve BERG by at least 3 points in order to demonstrate clinically significant improvement in balance.    Baseline  02/08/17: 30/56 12/18: 38/56      Time  8    Period  Weeks    Status  Achieved      PT LONG TERM GOAL #4   Title  Pt/wife will verbalize understanding of plans for continued community fitness upon D/C from PT including aquatic therapy program    Baseline  02/08/17: pt currently reports being more comfortable in the water    Time  8    Period  Weeks    Status  Partially Met      PT LONG TERM GOAL #5   Title  Pt will decrease mODI scoreby at least 13 points in order demonstrate clinically significant reduction in pain/disability     Baseline  02/08/17: 54% 12/18: 46% 2/7: 42%    Time  8    Period  Weeks    Status  Partially Met      PT LONG TERM GOAL #6   Title  Pt will improve BERG to 44/56  in order to demonstrate clinically significant improvement in balance for decreased fall risk    Baseline  12/18: 38/56 2/7: 41/56    Time  8    Period  Weeks    Status  Partially Met            Plan - 06/20/17 1446    Clinical Impression Statement  Pt noted to have swelling along L Achilles tendon and some tightness with stretch. Pt denies pain with ambulation/activity in the pool at the L ankle. Pt does note pain/mild discomfort with deep breathing and tender to the touch on the R lower rib cage.     Rehab Potential  Fair    PT Frequency  2x / week    PT Duration  8 weeks    PT Treatment/Interventions  ADLs/Self Care Home Management;Aquatic Therapy;Cryotherapy;Electrical Stimulation;Iontophoresis 45m/ml Dexamethasone;Moist Heat;Ultrasound;Contrast Bath;DME Instruction;Gait training;Stair training;Functional mobility  training;Therapeutic activities;Therapeutic exercise;Balance training;Neuromuscular re-education;Patient/family education;Manual techniques;Wheelchair mobility training;Energy conservation;Passive range of motion    PT Next Visit Plan  progress balance, strength, aquatic therapy    PT Home Exercise Plan  None provided at this time until further assessed, wife reports [facility]    Consulted and Agree with Plan of Care  Patient;Family member/caregiver    Family Member Consulted  wife       Patient will benefit from skilled therapeutic intervention in order to improve the following deficits and impairments:  Abnormal gait, Decreased balance, Decreased coordination, Decreased mobility, Impaired tone, Postural dysfunction  Visit Diagnosis: Muscle weakness (generalized)  Repeated falls  Unsteadiness on feet  Other abnormalities of gait and mobility  Unspecified lack of coordination  Other lack of coordination  Abnormal posture  Difficulty walking  Chronic midline low back pain without sciatica  Parkinson's disease ([facility])  Pain in right hip  Arthralgia of hip, unspecified laterality     Problem List Patient Active Problem List   Diagnosis Date Noted  . 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 Radicular pain in right arm 10/09/2016  . Rash 09/03/2016  . GERD (gastroesophageal reflux disease) 07/31/2016  . Trochanteric bursitis 03/03/2016  . Left hand pain 10/30/2015  . Fracture, finger, multiple sites 10/30/2015  .  Colon cancer screening 07/23/2015  . Healthcare maintenance 07/23/2015  . Gout 02/18/2015  . Depression 01/06/2015  . Irritation of eyelid 08/20/2014  . Dupuytren's contracture 08/20/2014  . Cough 07/21/2014  . Lumbar stenosis with neurogenic claudication 06/13/2014  . Preop cardiovascular exam 05/28/2014  . S/P deep brain stimulator placement 05/08/2014  . Joint pain 01/22/2014  . Medicare annual wellness visit, initial  01/19/2014  . Advance care planning 01/19/2014  . Parkinson's disease ([place]) 12/13/2013  . PTSD (post-traumatic stress disorder) 06/13/2013  . Erectile dysfunction 06/13/2013  . HLD (hyperlipidemia) 06/13/2013  . Hip pain 06/10/2013  . Pain in joint, shoulder region 06/10/2013  . Right leg swelling 06/03/2013  . Essential hypertension 06/03/2013  . Bradycardia by electrocardiogram 06/03/2013  . Obstructive sleep apnea 03/12/2013  . Spinal stenosis, lumbar region, with neurogenic claudication 12/14/2012    Larae Grooms 06/20/2017, 2:50 PM  [place] [facility] SERVICES [place], [place], [address] Phone: [phone]   Fax:  [phone]  Name: [name] MRN: [id number] Date of Birth: [birth year]

## 2017-06-22 ENCOUNTER — Ambulatory Visit: Payer: Medicare PPO

## 2017-06-22 DIAGNOSIS — R2689 Other abnormalities of gait and mobility: Secondary | ICD-10-CM | POA: Diagnosis not present

## 2017-06-22 DIAGNOSIS — R2681 Unsteadiness on feet: Secondary | ICD-10-CM | POA: Diagnosis not present

## 2017-06-22 DIAGNOSIS — R296 Repeated falls: Secondary | ICD-10-CM | POA: Diagnosis not present

## 2017-06-22 DIAGNOSIS — M6281 Muscle weakness (generalized): Secondary | ICD-10-CM | POA: Diagnosis not present

## 2017-06-22 DIAGNOSIS — R278 Other lack of coordination: Secondary | ICD-10-CM | POA: Diagnosis not present

## 2017-06-22 DIAGNOSIS — R293 Abnormal posture: Secondary | ICD-10-CM | POA: Diagnosis not present

## 2017-06-22 DIAGNOSIS — R262 Difficulty in walking, not elsewhere classified: Secondary | ICD-10-CM | POA: Diagnosis not present

## 2017-06-22 DIAGNOSIS — R279 Unspecified lack of coordination: Secondary | ICD-10-CM | POA: Diagnosis not present

## 2017-06-22 DIAGNOSIS — M545 Low back pain: Secondary | ICD-10-CM | POA: Diagnosis not present

## 2017-06-22 NOTE — Therapy (Signed)
[place] MAIN [facility] SERVICES [address] [place], [place], [address] Phone: [phone]   Fax:  [phone]  Physical Therapy Treatment  Patient Details  Name: [name] MRN: [id number] Date of Birth: Apr 16, 1951 Referring Provider: Alonza Bogus, DO   Encounter Date: 06/22/2017    Past Medical History:  Diagnosis Date  . Arthritis   . Bradycardia   . Cancer [facility]) 2013   skin cancer  . Depression    ptsd  . Dysrhythmia    chronic slow heart rate  . GERD (gastroesophageal reflux disease)   . Headache(784.0)    tension headaches non recent  . History of chicken pox   . History of kidney stones   . Hypertension    treated with HCTZ  . Parkinson's disease ([place])    dx'ed 15 years ago  . PTSD (post-traumatic stress disorder)   . Shortness of breath dyspnea   . Sleep apnea    doesn't use C-pap  . Varicose veins     Past Surgical History:  Procedure Laterality Date  . CHOLECYSTECTOMY N/A 10/22/2014   Procedure: LAPAROSCOPIC CHOLECYSTECTOMY WITH INTRAOPERATIVE CHOLANGIOGRAM;  Surgeon: Dia Crawford III, MD;  Location: [facility] ORS;  Service: General;  Laterality: N/A;  . cyst removed      from lip as a child  . LUMBAR LAMINECTOMY/DECOMPRESSION MICRODISCECTOMY Bilateral 12/14/2012   Procedure: Bilateral lumbar three-four, four-five decompressive laminotomy/foraminotomy;  Surgeon: Charlie Pitter, MD;  Location: [place] NEURO ORS;  Service: Neurosurgery;  Laterality: Bilateral;  . PULSE GENERATOR IMPLANT Bilateral 12/13/2013   Procedure: Bilateral implantable pulse generator placement;  Surgeon: Erline Levine, MD;  Location: [place] NEURO ORS;  Service: Neurosurgery;  Laterality: Bilateral;  Bilateral implantable pulse generator placement  . skin cancer removed     from ears,   12 lft arm  rt leg 15  . SUBTHALAMIC STIMULATOR INSERTION Bilateral 12/06/2013   Procedure: SUBTHALAMIC STIMULATOR INSERTION;  Surgeon: Erline Levine, MD;  Location: [place] NEURO ORS;   Service: Neurosurgery;  Laterality: Bilateral;  Bilateral deep brain stimulator placement    There were no vitals filed for this visit.  Subjective Assessment - 06/22/17 1037    Subjective  Patient and patient's spouse report that patient has had multiple falls since this therapist has seen him last "too many to count".  Patient not doing his exercises at home but attempting to perform house hold tasks. Presents in wheelchair without his boot.     Pertinent History  Patient referred for physical therapy due to wife's request for patient to get aquatic therapy to help with his conditioning. Pt is also complaining of chronic low back and R hip pain and frequent falls. 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 The last time he was in the water he was very uncomfortable because he didn't feel like he had any control. Pt has a history of 2 low back surgeries in August 2014 and February 2016. Pt and wife are unable to recall exactly what was performed but they know that he had stenosis. Surgeries were performed by Dr. Annette Stable in [place]. Pt reports that his low back and R hip pain are constant. He describes the pain in the back as "dull" and the pain in the hip as "sharp." For both back and hip worst pain is 10/10, Best: 2/10, Present: 2/10. No particular episode of trauma to his low back or hip preceding the pain. Pt has  a history of Parkinson's with a deep brain stimulator. He ambulates with a 4 wheeled walker called a U-step (automatic brakes, audio cues for stepping). He states that he falls frequently and reports that he has had approximately 40-50 falls in the last 6 months. No specific recent changes in his health.     Currently in Pain?  No/denies      Nustep Lvl 3 4 minutes  Treat:  #5 lb ankle weights seated             Marching 2 15x, cues for edge of seat with arms crossed              LAQ 2x 10 with 3 second holds  with cues for keeping  back not against chair for core and posture stability    Seated abduction GTB 2x 10x with feet together and hold 3 seconds prior to returning to starting position  Standing:   Side stepping in // bars with RTB around toes for gluteal activation 4x length of bars.    Heel raises 20x   Airex pad: horizontal and vertical head turns 15x each direction. 6 minutes total, no LOB.   High knee marching without UE support 10x each leg, CGA, excessive posterior trunk lean with occasional LOB requiring PT assistance to regain COM.                         PT Education - 06/22/17 1038    Education provided  Yes    Education Details  HEP compliance. need for LE strength and functional  mobility.     Person(s) Educated  Patient    Methods  Explanation;Demonstration;Verbal cues    Comprehension  Verbalized understanding;Returned demonstration       PT Short Term Goals - 05/25/17 1328      PT SHORT TERM GOAL #1   Title  Pt will perform HEP with family's supervision, for improved balance, transfers, and gait.      Baseline  patient reports compliance, however not every day    Time  4    Period  Weeks    Status  Partially Met      PT SHORT TERM GOAL #2   Title  Pt will perform 5x sit<>stand in less than or equal to 15 seconds for improved efficiency and safety with transfers.    Baseline  19 seconds    Time  2    Period  Weeks    Status  On-going        PT Long Term Goals - 05/25/17 1328      PT LONG TERM GOAL #1   Title  Pt will decrease 5TSTS by at least 3 seconds in order to demonstrate clinically significant improvement in LE strength     Baseline  02/08/17: 20.1 seconds 12/18: 17 seconds     Time  8    Period  Weeks    Status  Achieved      PT LONG TERM GOAL #2   Title  Pt will decrease TUG by at least 3.4s in order to demonstrate decreased fall risk     Baseline  02/08/17: 22.1s 12/18: 20 seconds 2/7: 19 seconds    Time  8    Period  Weeks    Status   Partially Met      PT LONG TERM GOAL #3   Title  Pt will improve BERG by at least 3 points in order to  demonstrate clinically significant improvement in balance.    Baseline  02/08/17: 30/56 12/18: 38/56      Time  8    Period  Weeks    Status  Achieved      PT LONG TERM GOAL #4   Title  Pt/wife will verbalize understanding of plans for continued community fitness upon D/C from PT including aquatic therapy program    Baseline  02/08/17: pt currently reports being more comfortable in the water    Time  8    Period  Weeks    Status  Partially Met      PT LONG TERM GOAL #5   Title  Pt will decrease mODI scoreby at least 13 points in order demonstrate clinically significant reduction in pain/disability     Baseline  02/08/17: 54% 12/18: 46% 2/7: 42%    Time  8    Period  Weeks    Status  Partially Met      PT LONG TERM GOAL #6   Title  Pt will improve BERG to 44/56  in order to demonstrate clinically significant improvement in balance for decreased fall risk    Baseline  12/18: 38/56 2/7: 41/56    Time  8    Period  Weeks    Status  Partially Met            Plan - 06/22/17 1105    Clinical Impression Statement  Patient's frequent falls resultant of his LE weakness bilaterally. As patient fatigues he reports a "burning" in his legs which usually result in a fall at home. Patient educated on need for strengthening of LE at home to increase carryover. Pain in ribs required frequent rest breaks as it became exacerbated with standing interventions.  Patient will continue to benefit from skilled physical therapy to improve static and dynamic standing balance and increase functional capacity of activities.     Rehab Potential  Fair    PT Frequency  2x / week    PT Duration  8 weeks    PT Treatment/Interventions  ADLs/Self Care Home Management;Aquatic Therapy;Cryotherapy;Electrical Stimulation;Iontophoresis 35m/ml Dexamethasone;Moist Heat;Ultrasound;Contrast Bath;DME Instruction;Gait  training;Stair training;Functional mobility training;Therapeutic activities;Therapeutic exercise;Balance training;Neuromuscular re-education;Patient/family education;Manual techniques;Wheelchair mobility training;Energy conservation;Passive range of motion    PT Next Visit Plan  progress balance, strength, aquatic therapy    PT Home Exercise Plan  None provided at this time until further assessed, wife reports pt [facility]    Consulted and Agree with Plan of Care  Patient;Family member/caregiver    Family Member Consulted  wife       Patient will benefit from skilled therapeutic intervention in order to improve the following deficits and impairments:  Abnormal gait, Decreased balance, Decreased coordination, Decreased mobility, Impaired tone, Postural dysfunction  Visit Diagnosis: Muscle weakness (generalized)  Repeated falls  Other abnormalities of gait and mobility     Problem List Patient Active Problem List   Diagnosis Date Noted  . 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 Radicular pain in right arm 10/09/2016  . Rash 09/03/2016  . GERD (gastroesophageal reflux disease) 07/31/2016  . Trochanteric bursitis 03/03/2016  . Left hand pain 10/30/2015  . Fracture, finger, multiple sites 10/30/2015  . Colon cancer screening 07/23/2015  . Healthcare maintenance 07/23/2015  . Gout 02/18/2015  . Depression 01/06/2015  . Irritation of eyelid 08/20/2014  . Dupuytren's contracture 08/20/2014  . Cough 07/21/2014  . Lumbar stenosis with neurogenic claudication 06/13/2014  . Preop cardiovascular exam  05/28/2014  . S/P deep brain stimulator placement 05/08/2014  . Joint pain 01/22/2014  . Medicare annual wellness visit, initial 01/19/2014  . Advance care planning 01/19/2014  . Parkinson's disease ([place]) 12/13/2013  . PTSD (post-traumatic stress disorder) 06/13/2013  . Erectile dysfunction 06/13/2013  . HLD (hyperlipidemia) 06/13/2013  . Hip pain 06/10/2013   . Pain in joint, shoulder region 06/10/2013  . Right leg swelling 06/03/2013  . Essential hypertension 06/03/2013  . Bradycardia by electrocardiogram 06/03/2013  . Obstructive sleep apnea 03/12/2013  . Spinal stenosis, lumbar region, with neurogenic claudication 12/14/2012   Janna Arch, PT, DPT   Janna Arch 06/22/2017, 12:36 PM  [place] MAIN [facility] SERVICES [address] [place], [place], [address] Phone: [phone]   Fax:  [phone]  Name: JAMALE SPANGLER MRN: [id number] Date of Birth: 05/01/1950

## 2017-06-25 ENCOUNTER — Other Ambulatory Visit: Payer: Self-pay | Admitting: Neurology

## 2017-06-27 ENCOUNTER — Ambulatory Visit (INDEPENDENT_AMBULATORY_CARE_PROVIDER_SITE_OTHER): Payer: Medicare PPO | Admitting: Psychology

## 2017-06-27 ENCOUNTER — Telehealth: Payer: Self-pay

## 2017-06-27 DIAGNOSIS — F331 Major depressive disorder, recurrent, moderate: Secondary | ICD-10-CM

## 2017-06-27 DIAGNOSIS — F4323 Adjustment disorder with mixed anxiety and depressed mood: Secondary | ICD-10-CM | POA: Diagnosis not present

## 2017-06-27 NOTE — Telephone Encounter (Signed)
[name] at front desk advised me that pt had fallen in entrance area between front door and lobby door. pts wife who was with pt said she was trying to assist pt to sit in w/c and pts feet got tangled up. Pt fell to the floor; did not hit head, pt said "he was not hurting anywhere". Assisted pts wife in getting him to standing position and sat pt in w/c; Dr Damita Dunnings spoke with pt.

## 2017-06-28 DIAGNOSIS — G20 Parkinson's disease: Secondary | ICD-10-CM | POA: Diagnosis not present

## 2017-06-28 DIAGNOSIS — G4733 Obstructive sleep apnea (adult) (pediatric): Secondary | ICD-10-CM | POA: Diagnosis not present

## 2017-06-28 NOTE — Telephone Encounter (Signed)
This was prior to check in.  I went to the front talk to the patient and his wife, and to check on the patient.  She was trying to help him get into a wheelchair and he started moving on his own.  He fell soon after that.  We talked about routine cautions.  He had a very small superficial abrasion on the left proximal forearm that was not bleeding and did not need to be bandaged.  There was no exposed dermis.  We talked about options at future visits.  They can pull up and we can help get a wheelchair to the car.  We will do whatever we can to try to help keep the patient safe.  We talked about routine fall cautions at home.  He has been trying to use his electric wheelchair more at home to try to limit fall risk with ambulation.  He does not have any other apparent injury at the office today.  He is moving his neck normally.  He is not complaining of pain.  His heart is regular.  His lungs are clear.  He has no other abrasions noted.  He thanked me for checking on him.

## 2017-06-29 ENCOUNTER — Ambulatory Visit: Payer: Self-pay | Admitting: Psychology

## 2017-06-29 NOTE — Progress Notes (Signed)
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 1,000 Units by mouth daily.   escitalopram 10 MG tablet Commonly known as:  LEXAPRO Take 1 tablet (10 mg total) by mouth daily.   hydrOXYzine 10 MG tablet Commonly known as:  ATARAX/VISTARIL Take 10 mg by mouth at bedtime.   memantine 10 MG tablet Commonly known as:  NAMENDA TAKE 1 TABLET TWICE A DAY   naproxen 500 MG tablet Commonly known as:  NAPROSYN Take 1 tablet (500 mg total) by mouth 2 (two) times daily.   omeprazole 20 MG capsule Commonly known as:  PRILOSEC TAKE 1 CAPSULE DAILY   pravastatin 20 MG tablet Commonly known as:  PRAVACHOL TAKE 1 TABLET EVERY EVENING        PAST MEDICAL HISTORY:   Past Medical History:  Diagnosis Date  . Arthritis   . Bradycardia   . Cancer [facility]) 2013   skin cancer  . Depression    ptsd  . Dysrhythmia    chronic slow heart rate  . GERD (gastroesophageal reflux disease)   . Headache(784.0)    tension headaches non recent  . History of chicken pox   . History of kidney stones   . Hypertension    treated with HCTZ  . Parkinson's disease ([place])    dx'ed  15 years ago  . PTSD (post-traumatic stress disorder)   . Shortness of breath dyspnea   . Sleep apnea    doesn't use C-pap  . Varicose veins     PAST SURGICAL HISTORY:   Past Surgical History:  Procedure Laterality Date  . CHOLECYSTECTOMY N/A 10/22/2014   Procedure: LAPAROSCOPIC CHOLECYSTECTOMY WITH INTRAOPERATIVE CHOLANGIOGRAM;   Surgeon: Dia Crawford III, MD;  Location: [facility] ORS;  Service: General;  Laterality: N/A;  . cyst removed      from lip as a child  . LUMBAR LAMINECTOMY/DECOMPRESSION MICRODISCECTOMY Bilateral 12/14/2012   Procedure: Bilateral lumbar three-four, four-five decompressive laminotomy/foraminotomy;  Surgeon: Charlie Pitter, MD;  Location: [place] NEURO ORS;  Service: Neurosurgery;  Laterality: Bilateral;  . PULSE GENERATOR IMPLANT Bilateral 12/13/2013   Procedure: Bilateral implantable pulse generator placement;  Surgeon: Erline Levine, MD;  Location: [place] NEURO ORS;  Service: Neurosurgery;  Laterality: Bilateral;  Bilateral implantable pulse generator placement  . skin cancer removed     from ears,   12 lft arm  rt leg 15  . SUBTHALAMIC STIMULATOR INSERTION Bilateral 12/06/2013   Procedure: SUBTHALAMIC STIMULATOR INSERTION;  Surgeon: Erline Levine, MD;  Location: [place] NEURO ORS;  Service: Neurosurgery;  Laterality: Bilateral;  Bilateral deep brain stimulator placement    SOCIAL HISTORY:   Social History   Socioeconomic History  . Marital status: Married    Spouse name: Not on file  . Number of children: Not on file  . Years of education: Not on file  . Highest education level: Not on file  Social Needs  . Financial resource strain: Not on file  . Food insecurity - worry: Not on file  . Food insecurity - inability: Not on file  . Transportation needs - medical: Not on file  . Transportation needs - non-medical: Not on file  Occupational History  . Occupation: disabled    Comment: 2001, [place]    Comment: search and rescue helicopter  Tobacco Use  . Smoking status: Never Smoker  . Smokeless tobacco: Former Network engineer and Sexual Activity  . Alcohol use: Yes    Alcohol/week: 0.0 oz    Comment: occasional (twice per month)  . Drug use: No  . Sexual activity: No  Other Topics Concern  . Not on file  Social History Narrative   Previous [employer], CW-04   H/o PTSD.  Prev search and rescue work   3 kids  from prev relationship.    Married 2014    FAMILY HISTORY:   Family Status  Relation Name Status  . Mother  Alive       Arthritis, unknown medical hx  . Father  Deceased       Lung Cancer  . Sister  Alive       1 and one half sister  . Brother  Alive  . Son  Alive  . Daughter  Alive       2  . Neg Hx  (Not Specified)    ROS:  A complete 10 system review of systems was obtained and was unremarkable apart from what is mentioned above.  PHYSICAL EXAMINATION:    VITALS:   Vitals:   07/03/17 0807  BP: 138/70  Pulse: (!) 46  SpO2: 99%   Wt Readings from Last 3 Encounters:  06/11/17 192 lb (87.1 kg)  05/10/17 192 lb 8 oz (87.3 kg)  05/03/17 185 lb (83.9 kg)     GEN:  The patient appears stated age and is in NAD.  Interactive.  laughing HEENT:  Normocephalic, atraumatic.  The mucous membranes are moist. The superficial temporal arteries are without ropiness or tenderness. Cardiovascular: Bradycardic.  regular Lungs: Clear to auscultation bilaterally   Neurological examination:  Orientation: The patient is alert and oriented x3.   Cranial nerves: There is good facial symmetry.  The speech is fluent but dysarthric but understandable if he slows down.  He is hypophonic.    Soft palate rises symmetrically and there is no tongue deviation. Hearing is intact to conversational tone. Sensation: Sensation is intact to light touch throughout. Motor: Strength is at least antigravity x 4. Deep tendon reflexes: Deferred today.  Movement examination: Tone: There is normal tone in the UE Abnormal movements: There is no tremor noted today Coordination: There is good RAMs today Gait and Station: in [name] with hx of multiple falls.  Decided not to ambulate  DBS programming was performed today and battery is ERI  ASSESSMENT/PLAN:  1.  Parkinsons disease by hx.   -The patient is status post bilateral STN DBS on 12/06/2013 with battery placement on 12/13/2013.  battery is ERI.  Needs  changed and we will get it scheduled ASAP.  Sent message to neurosx as well as referral.  Told pt to call me if they don't hear about replacement in the next week.  -off of mirapex and selegeline  -continue carbidopa/levodopa 25/100, 2/1/1 but often times not taking middle of the day dosage due to sleeping.  Told wife/pt to not allow 3 hour nap in the day.  1 hour is enough.  Otherwise will increase risk of confusion 2.  LBP.   -Status post repeat L4-L5 laminotomy with foraminotomy and L1-L2 laminectomy on 06/13/2014.  Back pain is now resolved.   3.  Dysphagia  - He did have a modified barium swallow on 08/07/2015.  It demonstrated mild oral pharyngeal dysphagia (an improvement from 2015 examination) and a regular diet with thin liquids was recommended.  Getting worse again and we will reorder.   4.  OSAS with excessive daytime hypersomnolence.  -He had a nocturnal polysomnogram in November, 2018 demonstrating severe sleep apnea.  CPAP was recommended.  The patient has been called several times to get equipment but they haven't done that.  Encouraged them to call.  Discussed morbidity and mortality with sleep apnea 5.  Depression  -on Wellbutrin XL, 450 mg and lexapro and doing better.  Is doing better in this regard  -felt near suicidal on klonopin 6.  Early Hallucinations  -decided to hold on further meds right now.  Most are currently arising when sleeping at night and will wake up and see them.  Not scary.  -Took Nuplazid but felt didn't help.  He was on it for about 7 weeks before it was discontinued. 7.  RBD  -Still acting out the dreams some, but felt almost near suicidal on clonazepam.  Bedroom safety discussed.  Seeing Dr. Annamaria Boots 8. Bradycardia  -cardiology following.  Cardiology doesn't think that this is etiology for fatigue. Remains fairly bradycardic.  Looked at review hx and not significantly changed 9.  Parkinsons disease dementia  -He is now on Namenda, 10 mg twice per day. 10.   Follow up is anticipated in the next few months, sooner should new neurologic issues arise.  Much greater than 50% of this visit was spent in counseling and coordinating care.  Total face to face time:  25 min not including DBS time

## 2017-06-30 ENCOUNTER — Ambulatory Visit: Payer: Medicare PPO

## 2017-06-30 DIAGNOSIS — R2689 Other abnormalities of gait and mobility: Secondary | ICD-10-CM | POA: Diagnosis not present

## 2017-06-30 DIAGNOSIS — M545 Low back pain: Secondary | ICD-10-CM | POA: Diagnosis not present

## 2017-06-30 DIAGNOSIS — M6281 Muscle weakness (generalized): Secondary | ICD-10-CM

## 2017-06-30 DIAGNOSIS — R2681 Unsteadiness on feet: Secondary | ICD-10-CM | POA: Diagnosis not present

## 2017-06-30 DIAGNOSIS — R278 Other lack of coordination: Secondary | ICD-10-CM | POA: Diagnosis not present

## 2017-06-30 DIAGNOSIS — R262 Difficulty in walking, not elsewhere classified: Secondary | ICD-10-CM | POA: Diagnosis not present

## 2017-06-30 DIAGNOSIS — R296 Repeated falls: Secondary | ICD-10-CM | POA: Diagnosis not present

## 2017-06-30 DIAGNOSIS — R293 Abnormal posture: Secondary | ICD-10-CM | POA: Diagnosis not present

## 2017-06-30 DIAGNOSIS — R279 Unspecified lack of coordination: Secondary | ICD-10-CM | POA: Diagnosis not present

## 2017-06-30 NOTE — Therapy (Signed)
[place] MAIN [facility] SERVICES [address] [place], [place], [address] Phone: [phone]   Fax:  [phone]  Physical Therapy Treatment  Patient Details  Name: [name] MRN: [id number] Date of Birth: 10/09/1950 Referring Provider: Alonza Bogus, DO   Encounter Date: 06/30/2017  PT End of Session - 06/30/17 0858    Visit Number  27    Number of Visits  60    Date for PT Re-Evaluation  07/20/17    PT Start Time  0845    PT Stop Time  0930    PT Time Calculation (min)  45 min    Activity Tolerance  Patient tolerated treatment well;Treatment limited secondary to medical complications (Comment)    Behavior During Therapy  [facility] for tasks assessed/performed       Past Medical History:  Diagnosis Date  . Arthritis   . Bradycardia   . Cancer [facility]) 2013   skin cancer  . Depression    ptsd  . Dysrhythmia    chronic slow heart rate  . GERD (gastroesophageal reflux disease)   . Headache(784.0)    tension headaches non recent  . History of chicken pox   . History of kidney stones   . Hypertension    treated with HCTZ  . Parkinson's disease ([place])    dx'ed 15 years ago  . PTSD (post-traumatic stress disorder)   . Shortness of breath dyspnea   . Sleep apnea    doesn't use C-pap  . Varicose veins     Past Surgical History:  Procedure Laterality Date  . CHOLECYSTECTOMY N/A 10/22/2014   Procedure: LAPAROSCOPIC CHOLECYSTECTOMY WITH INTRAOPERATIVE CHOLANGIOGRAM;  Surgeon: Dia Crawford III, MD;  Location: [facility] ORS;  Service: General;  Laterality: N/A;  . cyst removed      from lip as a child  . LUMBAR LAMINECTOMY/DECOMPRESSION MICRODISCECTOMY Bilateral 12/14/2012   Procedure: Bilateral lumbar three-four, four-five decompressive laminotomy/foraminotomy;  Surgeon: Charlie Pitter, MD;  Location: [place] NEURO ORS;  Service: Neurosurgery;  Laterality: Bilateral;  . PULSE GENERATOR IMPLANT Bilateral 12/13/2013   Procedure: Bilateral implantable pulse  generator placement;  Surgeon: Erline Levine, MD;  Location: [place] NEURO ORS;  Service: Neurosurgery;  Laterality: Bilateral;  Bilateral implantable pulse generator placement  . skin cancer removed     from ears,   12 lft arm  rt leg 15  . SUBTHALAMIC STIMULATOR INSERTION Bilateral 12/06/2013   Procedure: SUBTHALAMIC STIMULATOR INSERTION;  Surgeon: Erline Levine, MD;  Location: [place] NEURO ORS;  Service: Neurosurgery;  Laterality: Bilateral;  Bilateral deep brain stimulator placement    There were no vitals filed for this visit.  Subjective Assessment - 06/30/17 0856    Subjective  Patient and patient's wife report ~15 falls in the past week. Patient reports a lot of them are when he is standing up and trying to move to fast. Refusing to use power chair at home or practice driving it.     Pertinent History  Patient referred for physical therapy due to wife's request for patient to get aquatic therapy to help with his conditioning. Pt is also complaining of chronic low back and R hip pain and frequent falls. 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 The last time he was in the water he was very uncomfortable because he didn't feel like he had any control. Pt has a history of 2 low back surgeries in August 2014 and February  2016. Pt and wife are unable to recall exactly what was performed but they know that he had stenosis. Surgeries were performed by Dr. Annette Stable in [place]. Pt reports that his low back and R hip pain are constant. He describes the pain in the back as "dull" and the pain in the hip as "sharp." For both back and hip worst pain is 10/10, Best: 2/10, Present: 2/10. No particular episode of trauma to his low back or hip preceding the pain. Pt has a history of Parkinson's with a deep brain stimulator. He ambulates with a 4 wheeled walker called a U-step (automatic brakes, audio cues for stepping). He states that he falls frequently and reports that  he has had approximately 40-50 falls in the last 6 months. No specific recent changes in his health.     Currently in Pain?  No/denies       Nustep Lvl 3 4 minutes   Treat:  #5 lb ankle weights seated             Marching 2 15x, cues for edge of seat with arms crossed              LAQ 2x 10 with 3 second holds  with cues for keeping back not against chair for core and posture stability    Seated abduction GTB 2x 10x with feet together and hold 3 seconds prior to returning to starting position   Standing:    Side stepping in // bars with RTB around toes for gluteal activation 4x length of bars.      Static standing 5lb bar raises 10x   Safe sit to stands in // bars, (pivot/turn to sit)   Large step with BUE support to colored lines 10x L 10x R  Walking in // bars with cues for speed of steps with PT calling 1,2,3,4 : improved gait mechanics and heel strike.    High knee marching  without UE support 10x each leg, CGA, excessive posterior trunk lean with occasional LOB requiring PT assistance to regain COM.       Pt. response to medical necessity:   Patient will continue to benefit from skilled physical therapy to improve static and dynamic standing balance and decrease fall risk for improved functional capacity of activities.                     PT Education - 06/30/17 0857    Education provided  Yes    Education Details  HEP compliance, need for LE strength and functional mobility.     Person(s) Educated  Patient    Methods  Explanation;Demonstration;Verbal cues    Comprehension  Verbalized understanding;Returned demonstration       PT Short Term Goals - 05/25/17 1328      PT SHORT TERM GOAL #1   Title  Pt will perform HEP with family's supervision, for improved balance, transfers, and gait.      Baseline  patient reports compliance, however not every day    Time  4    Period  Weeks    Status  Partially Met      PT SHORT TERM GOAL #2   Title  Pt  will perform 5x sit<>stand in less than or equal to 15 seconds for improved efficiency and safety with transfers.    Baseline  19 seconds    Time  2    Period  Weeks    Status  On-going  PT Long Term Goals - 05/25/17 1328      PT LONG TERM GOAL #1   Title  Pt will decrease 5TSTS by at least 3 seconds in order to demonstrate clinically significant improvement in LE strength     Baseline  02/08/17: 20.1 seconds 12/18: 17 seconds     Time  8    Period  Weeks    Status  Achieved      PT LONG TERM GOAL #2   Title  Pt will decrease TUG by at least 3.4s in order to demonstrate decreased fall risk     Baseline  02/08/17: 22.1s 12/18: 20 seconds 2/7: 19 seconds    Time  8    Period  Weeks    Status  Partially Met      PT LONG TERM GOAL #3   Title  Pt will improve BERG by at least 3 points in order to demonstrate clinically significant improvement in balance.    Baseline  02/08/17: 30/56 12/18: 38/56      Time  8    Period  Weeks    Status  Achieved      PT LONG TERM GOAL #4   Title  Pt/wife will verbalize understanding of plans for continued community fitness upon D/C from PT including aquatic therapy program    Baseline  02/08/17: pt currently reports being more comfortable in the water    Time  8    Period  Weeks    Status  Partially Met      PT LONG TERM GOAL #5   Title  Pt will decrease mODI scoreby at least 13 points in order demonstrate clinically significant reduction in pain/disability     Baseline  02/08/17: 54% 12/18: 46% 2/7: 42%    Time  8    Period  Weeks    Status  Partially Met      PT LONG TERM GOAL #6   Title  Pt will improve BERG to 44/56  in order to demonstrate clinically significant improvement in balance for decreased fall risk    Baseline  12/18: 38/56 2/7: 41/56    Time  8    Period  Weeks    Status  Partially Met            Plan - 06/30/17 0919    Clinical Impression Statement  Patient presenting with increased posterior trunk lean  today. Increased frequency of falling noted with patient reluctant to use power chair at home or AD's. Large steps performed with max cueing for velocity as patient tends to speed up task losing body mechanics and COM.  Patient will continue to benefit from skilled physical therapy to improve static and dynamic standing balance and decrease fall risk for improved functional capacity of activities.     Rehab Potential  Fair    PT Frequency  2x / week    PT Duration  8 weeks    PT Treatment/Interventions  ADLs/Self Care Home Management;Aquatic Therapy;Cryotherapy;Electrical Stimulation;Iontophoresis '4mg'$ /ml Dexamethasone;Moist Heat;Ultrasound;Contrast Bath;DME Instruction;Gait training;Stair training;Functional mobility training;Therapeutic activities;Therapeutic exercise;Balance training;Neuromuscular re-education;Patient/family education;Manual techniques;Wheelchair mobility training;Energy conservation;Passive range of motion    PT Next Visit Plan  progress balance, strength, aquatic therapy    PT Home Exercise Plan  None provided at this time until further assessed, wife reports pt does not perform current HEP from [facility] PT    Consulted and Agree with Plan of Care  Patient;Family member/caregiver    Family Member Consulted  wife  Patient will benefit from skilled therapeutic intervention in order to improve the following deficits and impairments:  Abnormal gait, Decreased balance, Decreased coordination, Decreased mobility, Impaired tone, Postural dysfunction  Visit Diagnosis: Muscle weakness (generalized)  Repeated falls  Other abnormalities of gait and mobility  Unsteadiness on feet     Problem List Patient Active Problem List   Diagnosis Date Noted  . 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 Radicular pain in right arm 10/09/2016  . Rash 09/03/2016  . GERD (gastroesophageal reflux disease) 07/31/2016  . Trochanteric bursitis 03/03/2016  . Left hand pain 10/30/2015  . Fracture,  finger, multiple sites 10/30/2015  . Colon cancer screening 07/23/2015  . Healthcare maintenance 07/23/2015  . Gout 02/18/2015  . Depression 01/06/2015  . Irritation of eyelid 08/20/2014  . Dupuytren's contracture 08/20/2014  . Cough 07/21/2014  . Lumbar stenosis with neurogenic claudication 06/13/2014  . Preop cardiovascular exam 05/28/2014  . S/P deep brain stimulator placement 05/08/2014  . Joint pain 01/22/2014  . Medicare annual wellness visit, initial 01/19/2014  . Advance care planning 01/19/2014  . Parkinson's disease ([place]) 12/13/2013  . PTSD (post-traumatic stress disorder) 06/13/2013  . Erectile dysfunction 06/13/2013  . HLD (hyperlipidemia) 06/13/2013  . Hip pain 06/10/2013  . Pain in joint, shoulder region 06/10/2013  . Right leg swelling 06/03/2013  . Essential hypertension 06/03/2013  . Bradycardia by electrocardiogram 06/03/2013  . Obstructive sleep apnea 03/12/2013  . Spinal stenosis, lumbar region, with neurogenic claudication 12/14/2012   Janna Arch, PT, DPT   Janna Arch 06/30/2017, 10:28 AM  [place] MAIN [facility] SERVICES [address] [place], [place], [address] Phone: [phone]   Fax:  [phone]  Name: [name] MRN: [id number] Date of Birth: Dec 01, 1950

## 2017-07-02 DIAGNOSIS — G20 Parkinson's disease: Secondary | ICD-10-CM | POA: Diagnosis not present

## 2017-07-02 DIAGNOSIS — G4733 Obstructive sleep apnea (adult) (pediatric): Secondary | ICD-10-CM | POA: Diagnosis not present

## 2017-07-03 ENCOUNTER — Encounter: Payer: Self-pay | Admitting: Neurology

## 2017-07-03 ENCOUNTER — Other Ambulatory Visit: Payer: Self-pay | Admitting: Internal Medicine

## 2017-07-03 ENCOUNTER — Telehealth: Payer: Self-pay | Admitting: Neurology

## 2017-07-03 ENCOUNTER — Ambulatory Visit (INDEPENDENT_AMBULATORY_CARE_PROVIDER_SITE_OTHER): Payer: Medicare PPO | Admitting: Neurology

## 2017-07-03 VITALS — BP 138/70 | HR 46

## 2017-07-03 DIAGNOSIS — Z9689 Presence of other specified functional implants: Secondary | ICD-10-CM

## 2017-07-03 DIAGNOSIS — F0280 Dementia in other diseases classified elsewhere without behavioral disturbance: Secondary | ICD-10-CM

## 2017-07-03 DIAGNOSIS — R1319 Other dysphagia: Secondary | ICD-10-CM | POA: Diagnosis not present

## 2017-07-03 DIAGNOSIS — G20 Parkinson's disease: Secondary | ICD-10-CM

## 2017-07-03 NOTE — Telephone Encounter (Signed)
LMOM for [place] scheduling to call me back to set up MBE. [phone])

## 2017-07-03 NOTE — Telephone Encounter (Signed)
Patient scheduled for MBE on 07/19/17 at 1:00 pm. Mychart message sent to patient.

## 2017-07-03 NOTE — Addendum Note (Signed)
Addended byAnnamaria Helling on: 07/03/2017 11:35 AM   Modules accepted: Orders

## 2017-07-03 NOTE — Procedures (Signed)
DBS Programming was performed.    Total time spent programming was 10 minutes.  Device was confirmed to be on.  Soft start was confirmed to be on at 8.  Impedences were checked (therapy and electrode) and were within normal limits.  Battery was checked and was determined to be functioning normally and was ERI.  Battery on the right was 2.39 and on the left was 2.55, both indicating end of life.   This was all documented on a separate neurophysiologic worksheet.  Final settings were as follows:  Left brain electrode:     1-C+           ; Amplitude  4.0   V   ; Pulse width 90 microseconds;   Frequency   130   Hz.  Right brain electrode:     2-C+          ; Amplitude   4.3  V ;  Pulse width 60  microseconds;  Frequency   130    Hz.

## 2017-07-03 NOTE — Patient Instructions (Signed)
Parkinson's Caregiver Group   Parkinson's disease can be challenging for caregivers too. Changing  abilities and assuming new roles within the family can cause emotional  upheaval. Meeting with a group of peers who are experiencing similar changes is very beneficial for any caregiver. This professionally led support group will provide a safe place for Parkinson's caregivers to connect, share challenges, seek solutions, learn about resources and strengthen coping skills.    When:  Last Monday of the month (unless date falls on a holiday)  2019 Dates: 1/28, 2/25, 3/25, 4/29, 5/20, 6/24, 7/29, 8/26, 9/30, 10/28, 11/25, 12/30   Location: [facility]                                                                                              [address]                                                                                                 [place], [place] [address]                                                                                      Room 204 Time: 2-3:30   Please call Jessica Thomas, MSW, LCSW at [phone] with any questions.   

## 2017-07-03 NOTE — Telephone Encounter (Signed)
Referral faxed to [facility] at [phone] with confirmation received. They will contact the patient to schedule. Needs battery change ASAP.

## 2017-07-04 NOTE — Telephone Encounter (Signed)
Received note from Dr. Vertell Limber. Patient has appt with him scheduled 07/07/17 at 12:45 pm.

## 2017-07-05 ENCOUNTER — Ambulatory Visit: Payer: Medicare PPO

## 2017-07-05 DIAGNOSIS — R262 Difficulty in walking, not elsewhere classified: Secondary | ICD-10-CM | POA: Diagnosis not present

## 2017-07-05 DIAGNOSIS — R2681 Unsteadiness on feet: Secondary | ICD-10-CM

## 2017-07-05 DIAGNOSIS — M545 Low back pain: Secondary | ICD-10-CM | POA: Diagnosis not present

## 2017-07-05 DIAGNOSIS — R296 Repeated falls: Secondary | ICD-10-CM | POA: Diagnosis not present

## 2017-07-05 DIAGNOSIS — R279 Unspecified lack of coordination: Secondary | ICD-10-CM | POA: Diagnosis not present

## 2017-07-05 DIAGNOSIS — R293 Abnormal posture: Secondary | ICD-10-CM | POA: Diagnosis not present

## 2017-07-05 DIAGNOSIS — R2689 Other abnormalities of gait and mobility: Secondary | ICD-10-CM | POA: Diagnosis not present

## 2017-07-05 DIAGNOSIS — M6281 Muscle weakness (generalized): Secondary | ICD-10-CM | POA: Diagnosis not present

## 2017-07-05 DIAGNOSIS — R278 Other lack of coordination: Secondary | ICD-10-CM | POA: Diagnosis not present

## 2017-07-05 NOTE — Therapy (Signed)
[place] MAIN [facility] SERVICES [address] [place], [place], [address] Phone: [phone]   Fax:  [phone]  Physical Therapy Treatment  Patient Details  Name: [name] MRN: [id number] Date of Birth: [birth year] Referring Provider: Alonza Bogus, DO   Encounter Date: 07/05/2017  PT End of Session - 07/05/17 0808    Visit Number  28    Number of Visits  18    Date for PT Re-Evaluation  07/20/17    PT Start Time  0800    PT Stop Time  0845    PT Time Calculation (min)  45 min    Activity Tolerance  Patient tolerated treatment well;Treatment limited secondary to medical complications (Comment)    Behavior During Therapy  [facility] for tasks assessed/performed       Past Medical History:  Diagnosis Date  . Arthritis   . Bradycardia   . Cancer [facility]) 2013   skin cancer  . Depression    ptsd  . Dysrhythmia    chronic slow heart rate  . GERD (gastroesophageal reflux disease)   . Headache(784.0)    tension headaches non recent  . History of chicken pox   . History of kidney stones   . Hypertension    treated with HCTZ  . Parkinson's disease ([place])    dx'ed 15 years ago  . PTSD (post-traumatic stress disorder)   . Shortness of breath dyspnea   . Sleep apnea    doesn't use C-pap  . Varicose veins     Past Surgical History:  Procedure Laterality Date  . CHOLECYSTECTOMY N/A 10/22/2014   Procedure: LAPAROSCOPIC CHOLECYSTECTOMY WITH INTRAOPERATIVE CHOLANGIOGRAM;  Surgeon: Dia Crawford III, MD;  Location: [facility] ORS;  Service: General;  Laterality: N/A;  . cyst removed      from lip as a child  . LUMBAR LAMINECTOMY/DECOMPRESSION MICRODISCECTOMY Bilateral 12/14/2012   Procedure: Bilateral lumbar three-four, four-five decompressive laminotomy/foraminotomy;  Surgeon: Charlie Pitter, MD;  Location: [place] NEURO ORS;  Service: Neurosurgery;  Laterality: Bilateral;  . PULSE GENERATOR IMPLANT Bilateral 12/13/2013   Procedure: Bilateral implantable pulse  generator placement;  Surgeon: Erline Levine, MD;  Location: [place] NEURO ORS;  Service: Neurosurgery;  Laterality: Bilateral;  Bilateral implantable pulse generator placement  . skin cancer removed     from ears,   12 lft arm  rt leg 15  . SUBTHALAMIC STIMULATOR INSERTION Bilateral 12/06/2013   Procedure: SUBTHALAMIC STIMULATOR INSERTION;  Surgeon: Erline Levine, MD;  Location: [place] NEURO ORS;  Service: Neurosurgery;  Laterality: Bilateral;  Bilateral deep brain stimulator placement    There were no vitals filed for this visit.  Subjective Assessment - 07/05/17 0806    Subjective  Patient reports a couple falls since last session with two yesterday from out of a chair. Reports some pain in lower back from where he fell on it.     Pertinent History  Patient referred for physical therapy due to wife's request for patient to get aquatic therapy to help with his conditioning. Pt is also complaining of chronic low back and R hip pain and frequent falls. 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 The last time he was in the water he was very uncomfortable because he didn't feel like he had any control. Pt has a history of 2 low back surgeries in August 2014 and February 2016. Pt and wife are unable to recall exactly what was performed  but they know that he had stenosis. Surgeries were performed by Dr. Annette Stable in [place]. Pt reports that his low back and R hip pain are constant. He describes the pain in the back as "dull" and the pain in the hip as "sharp." For both back and hip worst pain is 10/10, Best: 2/10, Present: 2/10. No particular episode of trauma to his low back or hip preceding the pain. Pt has a history of Parkinson's with a deep brain stimulator. He ambulates with a 4 wheeled walker called a U-step (automatic brakes, audio cues for stepping). He states that he falls frequently and reports that he has had approximately 40-50 falls in the last 6 months.  No specific recent changes in his health.     Currently in Pain?  Yes    Pain Score  3     Pain Location  Back    Pain Orientation  Lower    Pain Descriptors / Indicators  Aching    Pain Type  Chronic pain    Pain Onset  In the past 7 days    Pain Frequency  Constant      Nustep Lvl 4 4 minutes  SPT from WC to Nustep required 3 attempts with PT requiring patient to stop count out loud to 5 before retrying to to patient becoming agitated and beginning to flail.    Sit to stand transfer from matt table x10, cues for spacing stands apart to decrease excessive movement  leading to loss of balance.  one posterior LOB requiring Mod A to retain COM. Verbal cues for nose over toes to decrease posterior LOB  Seated weighted ball flexion 10x, cross body D2 pattern 10x each direction to challenge trunk stability    #5 lb ankle weights seated: cues for not posteriorly leaning.              Marching 2 15x, cues for edge of seat with arms crossed              LAQ 2x 12 with 3 second holds  with cues for keeping back not against chair for core and posture stability    Seated abduction GTB 2x 10x with feet together and hold 3 seconds prior to returning to starting position  Resisted hamstring curl GTB 2x15 each leg     6" step toe taps 20x each leg 6" side toe taps 20x each leg.    Walking in // bars with cues for speed of steps with PT calling 1,2,3,4 : improved gait mechanics and heel strike.      Pt. response to medical necessity:   Patient will continue to benefit from skilled physical therapy to improve static and dynamic standing balance and decrease fall risk for improved functional capacity of activities.                         PT Education - 07/05/17 0808    Education provided  Yes    Education Details  HEp compliance, sit to stand transfer, functional mobility     Person(s) Educated  Patient    Methods  Explanation;Demonstration;Verbal cues;Tactile cues     Comprehension  Verbalized understanding;Returned demonstration;Need further instruction       PT Short Term Goals - 05/25/17 1328      PT SHORT TERM GOAL #1   Title  Pt will perform HEP with family's supervision, for improved balance, transfers, and gait.      Baseline  patient reports compliance, however not every day    Time  4    Period  Weeks    Status  Partially Met      PT SHORT TERM GOAL #2   Title  Pt will perform 5x sit<>stand in less than or equal to 15 seconds for improved efficiency and safety with transfers.    Baseline  19 seconds    Time  2    Period  Weeks    Status  On-going        PT Long Term Goals - 05/25/17 1328      PT LONG TERM GOAL #1   Title  Pt will decrease 5TSTS by at least 3 seconds in order to demonstrate clinically significant improvement in LE strength     Baseline  02/08/17: 20.1 seconds 12/18: 17 seconds     Time  8    Period  Weeks    Status  Achieved      PT LONG TERM GOAL #2   Title  Pt will decrease TUG by at least 3.4s in order to demonstrate decreased fall risk     Baseline  02/08/17: 22.1s 12/18: 20 seconds 2/7: 19 seconds    Time  8    Period  Weeks    Status  Partially Met      PT LONG TERM GOAL #3   Title  Pt will improve BERG by at least 3 points in order to demonstrate clinically significant improvement in balance.    Baseline  02/08/17: 30/56 12/18: 38/56      Time  8    Period  Weeks    Status  Achieved      PT LONG TERM GOAL #4   Title  Pt/wife will verbalize understanding of plans for continued community fitness upon D/C from PT including aquatic therapy program    Baseline  02/08/17: pt currently reports being more comfortable in the water    Time  8    Period  Weeks    Status  Partially Met      PT LONG TERM GOAL #5   Title  Pt will decrease mODI scoreby at least 13 points in order demonstrate clinically significant reduction in pain/disability     Baseline  02/08/17: 54% 12/18: 46% 2/7: 42%    Time  8     Period  Weeks    Status  Partially Met      PT LONG TERM GOAL #6   Title  Pt will improve BERG to 44/56  in order to demonstrate clinically significant improvement in balance for decreased fall risk    Baseline  12/18: 38/56 2/7: 41/56    Time  8    Period  Weeks    Status  Partially Met            Plan - 07/05/17 0831    Clinical Impression Statement  Patient challenged by sit to stands with excessive posterior trunk loss of balance exacerbated by patient's quick motions. Decreased velocity of transfer encouraged by having patient count out loud to 5 prior to each transfer. Core challenged by seated strengthening interventions for LE to retain upright posture.   Patient will continue to benefit from skilled physical therapy to improve static and dynamic standing balance and decrease fall risk for improved functional capacity of activities.    Rehab Potential  Fair    PT Frequency  2x / week    PT Duration  8 weeks  PT Treatment/Interventions  ADLs/Self Care Home Management;Aquatic Therapy;Cryotherapy;Electrical Stimulation;Iontophoresis '4mg'$ /ml Dexamethasone;Moist Heat;Ultrasound;Contrast Bath;DME Instruction;Gait training;Stair training;Functional mobility training;Therapeutic activities;Therapeutic exercise;Balance training;Neuromuscular re-education;Patient/family education;Manual techniques;Wheelchair mobility training;Energy conservation;Passive range of motion    PT Next Visit Plan  progress balance, strength, aquatic therapy    PT Home Exercise Plan  None provided at this time until further assessed, wife reports pt does not perform current HEP from [facility] PT    Consulted and Agree with Plan of Care  Patient;Family member/caregiver    Family Member Consulted  wife       Patient will benefit from skilled therapeutic intervention in order to improve the following deficits and impairments:  Abnormal gait, Decreased balance, Decreased coordination, Decreased mobility, Impaired tone,  Postural dysfunction  Visit Diagnosis: Muscle weakness (generalized)  Repeated falls  Other abnormalities of gait and mobility  Unsteadiness on feet  Unspecified lack of coordination     Problem List Patient Active Problem List   Diagnosis Date Noted  . 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 Radicular pain in right arm 10/09/2016  . Rash 09/03/2016  . GERD (gastroesophageal reflux disease) 07/31/2016  . Trochanteric bursitis 03/03/2016  . Left hand pain 10/30/2015  . Fracture, finger, multiple sites 10/30/2015  . Colon cancer screening 07/23/2015  . Healthcare maintenance 07/23/2015  . Gout 02/18/2015  . Depression 01/06/2015  . Irritation of eyelid 08/20/2014  . Dupuytren's contracture 08/20/2014  . Cough 07/21/2014  . Lumbar stenosis with neurogenic claudication 06/13/2014  . Preop cardiovascular exam 05/28/2014  . S/P deep brain stimulator placement 05/08/2014  . Joint pain 01/22/2014  . Medicare annual wellness visit, initial 01/19/2014  . Advance care planning 01/19/2014  . Parkinson's disease ([place]) 12/13/2013  . PTSD (post-traumatic stress disorder) 06/13/2013  . Erectile dysfunction 06/13/2013  . HLD (hyperlipidemia) 06/13/2013  . Hip pain 06/10/2013  . Pain in joint, shoulder region 06/10/2013  . Right leg swelling 06/03/2013  . Essential hypertension 06/03/2013  . Bradycardia by electrocardiogram 06/03/2013  . Obstructive sleep apnea 03/12/2013  . Spinal stenosis, lumbar region, with neurogenic claudication 12/14/2012   Janna Arch, PT, DPT   Janna Arch 07/05/2017, 8:47 AM  [place] MAIN [facility] SERVICES [address] [place], [place], [address] Phone: [phone]   Fax:  [phone]  Name: [name] MRN: [id number] Date of Birth: 12/26/1950

## 2017-07-07 ENCOUNTER — Other Ambulatory Visit: Payer: Self-pay | Admitting: Neurosurgery

## 2017-07-07 ENCOUNTER — Other Ambulatory Visit: Payer: Self-pay | Admitting: Family Medicine

## 2017-07-07 DIAGNOSIS — I10 Essential (primary) hypertension: Secondary | ICD-10-CM | POA: Diagnosis not present

## 2017-07-07 DIAGNOSIS — G20 Parkinson's disease: Secondary | ICD-10-CM | POA: Diagnosis not present

## 2017-07-07 DIAGNOSIS — Z6827 Body mass index (BMI) 27.0-27.9, adult: Secondary | ICD-10-CM | POA: Diagnosis not present

## 2017-07-07 DIAGNOSIS — Z4542 Encounter for adjustment and management of neuropacemaker (brain) (peripheral nerve) (spinal cord): Secondary | ICD-10-CM | POA: Diagnosis not present

## 2017-07-11 ENCOUNTER — Ambulatory Visit (INDEPENDENT_AMBULATORY_CARE_PROVIDER_SITE_OTHER): Payer: Medicare PPO | Admitting: Psychology

## 2017-07-11 DIAGNOSIS — F4323 Adjustment disorder with mixed anxiety and depressed mood: Secondary | ICD-10-CM

## 2017-07-11 DIAGNOSIS — F331 Major depressive disorder, recurrent, moderate: Secondary | ICD-10-CM | POA: Diagnosis not present

## 2017-07-13 ENCOUNTER — Ambulatory Visit: Payer: Self-pay | Admitting: Psychology

## 2017-07-13 ENCOUNTER — Encounter (HOSPITAL_COMMUNITY): Payer: Self-pay | Admitting: *Deleted

## 2017-07-13 ENCOUNTER — Other Ambulatory Visit: Payer: Self-pay

## 2017-07-14 ENCOUNTER — Encounter (HOSPITAL_COMMUNITY): Payer: Self-pay | Admitting: Surgery

## 2017-07-14 ENCOUNTER — Ambulatory Visit (HOSPITAL_COMMUNITY): Payer: Medicare PPO

## 2017-07-14 ENCOUNTER — Ambulatory Visit (HOSPITAL_COMMUNITY)
Admission: RE | Admit: 2017-07-14 | Discharge: 2017-07-14 | Disposition: A | Discharge status: Home / Self Care | Payer: Medicare PPO | Source: Ambulatory Visit | Attending: Neurosurgery | Admitting: Neurosurgery

## 2017-07-14 ENCOUNTER — Encounter (HOSPITAL_COMMUNITY)
Admission: RE | Disposition: A | Discharge status: Home / Self Care | Payer: Self-pay | Source: Ambulatory Visit | Attending: Neurosurgery

## 2017-07-14 DIAGNOSIS — Z885 Allergy status to narcotic agent status: Secondary | ICD-10-CM | POA: Insufficient documentation

## 2017-07-14 DIAGNOSIS — E785 Hyperlipidemia, unspecified: Secondary | ICD-10-CM | POA: Diagnosis not present

## 2017-07-14 DIAGNOSIS — Z79899 Other long term (current) drug therapy: Secondary | ICD-10-CM | POA: Diagnosis not present

## 2017-07-14 DIAGNOSIS — Z791 Long term (current) use of non-steroidal anti-inflammatories (NSAID): Secondary | ICD-10-CM | POA: Diagnosis not present

## 2017-07-14 DIAGNOSIS — E669 Obesity, unspecified: Secondary | ICD-10-CM | POA: Diagnosis not present

## 2017-07-14 DIAGNOSIS — Z462 Encounter for fitting and adjustment of other devices related to nervous system and special senses: Secondary | ICD-10-CM | POA: Diagnosis not present

## 2017-07-14 DIAGNOSIS — Z6827 Body mass index (BMI) 27.0-27.9, adult: Secondary | ICD-10-CM | POA: Diagnosis not present

## 2017-07-14 DIAGNOSIS — I10 Essential (primary) hypertension: Secondary | ICD-10-CM | POA: Diagnosis not present

## 2017-07-14 DIAGNOSIS — G20 Parkinson's disease: Secondary | ICD-10-CM | POA: Insufficient documentation

## 2017-07-14 DIAGNOSIS — K219 Gastro-esophageal reflux disease without esophagitis: Secondary | ICD-10-CM | POA: Insufficient documentation

## 2017-07-14 DIAGNOSIS — Z4542 Encounter for adjustment and management of neuropacemaker (brain) (peripheral nerve) (spinal cord): Secondary | ICD-10-CM | POA: Insufficient documentation

## 2017-07-14 DIAGNOSIS — Z888 Allergy status to other drugs, medicaments and biological substances status: Secondary | ICD-10-CM | POA: Diagnosis not present

## 2017-07-14 DIAGNOSIS — F329 Major depressive disorder, single episode, unspecified: Secondary | ICD-10-CM | POA: Insufficient documentation

## 2017-07-14 DIAGNOSIS — Z79891 Long term (current) use of opiate analgesic: Secondary | ICD-10-CM | POA: Diagnosis not present

## 2017-07-14 DIAGNOSIS — M48061 Spinal stenosis, lumbar region without neurogenic claudication: Secondary | ICD-10-CM | POA: Diagnosis not present

## 2017-07-14 HISTORY — PX: SUBTHALAMIC STIMULATOR BATTERY REPLACEMENT: SHX5405

## 2017-07-14 LAB — CBC
HCT: 43.9 % (ref 39.0–52.0)
HEMOGLOBIN: 14.9 g/dL (ref 13.0–17.0)
MCH: 30.7 pg (ref 26.0–34.0)
MCHC: 33.9 g/dL (ref 30.0–36.0)
MCV: 90.3 fL (ref 78.0–100.0)
Platelets: 159 10*3/uL (ref 150–400)
RBC: 4.86 MIL/uL (ref 4.22–5.81)
RDW: 13.4 % (ref 11.5–15.5)
WBC: 6.5 10*3/uL (ref 4.0–10.5)

## 2017-07-14 LAB — BASIC METABOLIC PANEL
ANION GAP: 9 (ref 5–15)
BUN: 22 mg/dL — ABNORMAL HIGH (ref 6–20)
CHLORIDE: 107 mmol/L (ref 101–111)
CO2: 23 mmol/L (ref 22–32)
CREATININE: 0.88 mg/dL (ref 0.61–1.24)
Calcium: 8.9 mg/dL (ref 8.9–10.3)
GFR calc non Af Amer: >60 mL/min (ref >60)
Glucose, Bld: 100 mg/dL — ABNORMAL HIGH (ref 65–99)
POTASSIUM: 4 mmol/L (ref 3.5–5.1)
Sodium: 139 mmol/L (ref 135–145)

## 2017-07-14 SURGERY — SUBTHALAMIC STIMULATOR BATTERY REPLACEMENT
Anesthesia: General | Site: Chest | Laterality: Bilateral

## 2017-07-14 MED ORDER — HYDROMORPHONE HCL 1 MG/ML IJ SOLN: 0.2500 mg | INTRAMUSCULAR | Status: DC | PRN

## 2017-07-14 MED ORDER — LIDOCAINE-EPINEPHRINE 1 %-1:100000 IJ SOLN
INTRAMUSCULAR | Status: AC
  Filled 2017-07-14: qty 1

## 2017-07-14 MED ORDER — CHLORHEXIDINE GLUCONATE CLOTH 2 % EX PADS: 6.0000 | MEDICATED_PAD | Freq: Once | CUTANEOUS | Status: DC

## 2017-07-14 MED ORDER — 0.9 % SODIUM CHLORIDE (POUR BTL) OPTIME
TOPICAL | Status: DC | PRN
  Administered 2017-07-14: 1000 mL

## 2017-07-14 MED ORDER — BUPIVACAINE HCL (PF) 0.5 % IJ SOLN
INTRAMUSCULAR | Status: DC | PRN
  Administered 2017-07-14: 10 mL

## 2017-07-14 MED ORDER — BUPIVACAINE HCL (PF) 0.5 % IJ SOLN
INTRAMUSCULAR | Status: AC
  Filled 2017-07-14: qty 30

## 2017-07-14 MED ORDER — GLYCOPYRROLATE 0.2 MG/ML IJ SOLN
INTRAMUSCULAR | Status: DC | PRN
  Administered 2017-07-14: 0.2 mg via INTRAVENOUS

## 2017-07-14 MED ORDER — OXYCODONE HCL 5 MG/5ML PO SOLN: 5.0000 mg | Freq: Once | ORAL | Status: DC | PRN

## 2017-07-14 MED ORDER — LIDOCAINE-EPINEPHRINE 1 %-1:100000 IJ SOLN
INTRAMUSCULAR | Status: DC | PRN
  Administered 2017-07-14: 10 mL

## 2017-07-14 MED ORDER — PROPOFOL 10 MG/ML IV BOLUS
INTRAVENOUS | Status: DC | PRN
  Administered 2017-07-14: 200 mg via INTRAVENOUS

## 2017-07-14 MED ORDER — FENTANYL CITRATE (PF) 100 MCG/2ML IJ SOLN
INTRAMUSCULAR | Status: DC | PRN
  Administered 2017-07-14: 50 ug via INTRAVENOUS
  Administered 2017-07-14: 25 ug via INTRAVENOUS

## 2017-07-14 MED ORDER — PROMETHAZINE HCL 25 MG/ML IJ SOLN: 6.2500 mg | INTRAMUSCULAR | Status: DC | PRN

## 2017-07-14 MED ORDER — LACTATED RINGERS IV SOLN
INTRAVENOUS | Status: DC
  Administered 2017-07-14: 13:00:00 via INTRAVENOUS

## 2017-07-14 MED ORDER — PROPOFOL 10 MG/ML IV BOLUS
INTRAVENOUS | Status: AC
  Filled 2017-07-14: qty 20

## 2017-07-14 MED ORDER — VANCOMYCIN HCL 1000 MG IV SOLR
INTRAVENOUS | Status: DC | PRN
  Administered 2017-07-14: 1000 mg via TOPICAL

## 2017-07-14 MED ORDER — VANCOMYCIN HCL 1000 MG IV SOLR
INTRAVENOUS | Status: AC
  Filled 2017-07-14: qty 1000

## 2017-07-14 MED ORDER — ONDANSETRON HCL 4 MG/2ML IJ SOLN
INTRAMUSCULAR | Status: DC | PRN
  Administered 2017-07-14: 4 mg via INTRAVENOUS

## 2017-07-14 MED ORDER — FENTANYL CITRATE (PF) 250 MCG/5ML IJ SOLN
INTRAMUSCULAR | Status: AC
  Filled 2017-07-14: qty 5

## 2017-07-14 MED ORDER — CARBIDOPA-LEVODOPA 25-100 MG PO TABS
1.0000 | ORAL_TABLET | Freq: Three times a day (TID) | ORAL | Status: DC
  Administered 2017-07-14: 1 via ORAL
  Filled 2017-07-14: qty 1

## 2017-07-14 MED ORDER — MIDAZOLAM HCL 5 MG/5ML IJ SOLN
INTRAMUSCULAR | Status: DC | PRN
  Administered 2017-07-14: 2 mg via INTRAVENOUS

## 2017-07-14 MED ORDER — MIDAZOLAM HCL 2 MG/2ML IJ SOLN
INTRAMUSCULAR | Status: AC
  Filled 2017-07-14: qty 2

## 2017-07-14 MED ORDER — CEFAZOLIN SODIUM-DEXTROSE 2-4 GM/100ML-% IV SOLN
2.0000 g | INTRAVENOUS | Status: AC
  Administered 2017-07-14: 2 g via INTRAVENOUS
  Filled 2017-07-14: qty 100

## 2017-07-14 MED ORDER — BACITRACIN ZINC 500 UNIT/GM EX OINT: TOPICAL_OINTMENT | CUTANEOUS | Status: DC | PRN

## 2017-07-14 MED ORDER — LACTATED RINGERS IV SOLN
INTRAVENOUS | Status: DC | PRN
  Administered 2017-07-14: 13:00:00 via INTRAVENOUS

## 2017-07-14 MED ORDER — EPHEDRINE SULFATE 50 MG/ML IJ SOLN
INTRAMUSCULAR | Status: DC | PRN
  Administered 2017-07-14: 10 mg via INTRAVENOUS

## 2017-07-14 MED ORDER — BACITRACIN ZINC 500 UNIT/GM EX OINT
TOPICAL_OINTMENT | CUTANEOUS | Status: AC
  Filled 2017-07-14: qty 28.35

## 2017-07-14 MED ORDER — OXYCODONE HCL 5 MG PO TABS: 5.0000 mg | ORAL_TABLET | Freq: Once | ORAL | Status: DC | PRN

## 2017-07-14 MED ORDER — LIDOCAINE HCL (CARDIAC) 20 MG/ML IV SOLN
INTRAVENOUS | Status: DC | PRN
  Administered 2017-07-14: 80 mg via INTRAVENOUS

## 2017-07-14 SURGICAL SUPPLY — 44 items
CANISTER SUCT 3000ML PPV (MISCELLANEOUS) ×2 IMPLANT
CARTRIDGE OIL MAESTRO DRILL (MISCELLANEOUS) IMPLANT
DECANTER SPIKE VIAL GLASS SM (MISCELLANEOUS) IMPLANT
DERMABOND ADVANCED (GAUZE/BANDAGES/DRESSINGS) ×2
DERMABOND ADVANCED .7 DNX12 (GAUZE/BANDAGES/DRESSINGS) ×2 IMPLANT
DIFFUSER DRILL AIR PNEUMATIC (MISCELLANEOUS) IMPLANT
DRAPE CAMERA VIDEO/LASER (DRAPES) ×2 IMPLANT
DRAPE LAPAROTOMY 100X72 PEDS (DRAPES) ×2 IMPLANT
DRAPE POUCH INSTRU U-SHP 10X18 (DRAPES) ×2 IMPLANT
DRSG OPSITE POSTOP 4X6 (GAUZE/BANDAGES/DRESSINGS) ×4 IMPLANT
DURAPREP 26ML APPLICATOR (WOUND CARE) ×2 IMPLANT
GAUZE SPONGE 4X4 16PLY XRAY LF (GAUZE/BANDAGES/DRESSINGS) IMPLANT
GLOVE BIO SURGEON STRL SZ8 (GLOVE) ×2 IMPLANT
GLOVE BIOGEL PI IND STRL 6.5 (GLOVE) ×1 IMPLANT
GLOVE BIOGEL PI IND STRL 8 (GLOVE) ×1 IMPLANT
GLOVE BIOGEL PI IND STRL 8.5 (GLOVE) ×1 IMPLANT
GLOVE BIOGEL PI INDICATOR 6.5 (GLOVE) ×1
GLOVE BIOGEL PI INDICATOR 8 (GLOVE) ×1
GLOVE BIOGEL PI INDICATOR 8.5 (GLOVE) ×1
GLOVE ECLIPSE 8.0 STRL XLNG CF (GLOVE) ×2 IMPLANT
GLOVE EXAM NITRILE LRG STRL (GLOVE) IMPLANT
GLOVE EXAM NITRILE XL STR (GLOVE) IMPLANT
GLOVE EXAM NITRILE XS STR PU (GLOVE) IMPLANT
GLOVE SURG SS PI 6.5 STRL IVOR (GLOVE) ×2 IMPLANT
GOWN STRL REUS W/ TWL LRG LVL3 (GOWN DISPOSABLE) ×1 IMPLANT
GOWN STRL REUS W/ TWL XL LVL3 (GOWN DISPOSABLE) ×1 IMPLANT
GOWN STRL REUS W/TWL 2XL LVL3 (GOWN DISPOSABLE) ×2 IMPLANT
GOWN STRL REUS W/TWL LRG LVL3 (GOWN DISPOSABLE) ×1
GOWN STRL REUS W/TWL XL LVL3 (GOWN DISPOSABLE) ×1
KIT BASIN OR (CUSTOM PROCEDURE TRAY) ×2 IMPLANT
KIT TURNOVER KIT B (KITS) ×2 IMPLANT
NEEDLE HYPO 25X1 1.5 SAFETY (NEEDLE) ×2 IMPLANT
NEUROSTIM OCTOPOLAR ~~LOC~~ 60X55 (Neuro Prosthesis/Implant) ×4 IMPLANT
NEUROSTIM PROGRAMMER 2.2X3.7 (NEUROSURGERY SUPPLIES) ×2 IMPLANT
NS IRRIG 1000ML POUR BTL (IV SOLUTION) ×2 IMPLANT
OIL CARTRIDGE MAESTRO DRILL (MISCELLANEOUS)
PACK LAMINECTOMY NEURO (CUSTOM PROCEDURE TRAY) ×2 IMPLANT
PAD ARMBOARD 7.5X6 YLW CONV (MISCELLANEOUS) ×6 IMPLANT
STAPLER SKIN PROX WIDE 3.9 (STAPLE) ×2 IMPLANT
SUT VIC AB 2-0 CP2 18 (SUTURE) ×4 IMPLANT
SUT VIC AB 3-0 SH 8-18 (SUTURE) ×6 IMPLANT
TOWEL GREEN STERILE (TOWEL DISPOSABLE) ×2 IMPLANT
TOWEL GREEN STERILE FF (TOWEL DISPOSABLE) ×2 IMPLANT
WATER STERILE IRR 1000ML POUR (IV SOLUTION) ×2 IMPLANT

## 2017-07-14 NOTE — Transfer of Care (Signed)
Immediate Anesthesia Transfer of Care Note  Patient: [name]  Procedure(s) Performed: BILATERAL IMPLANTED PULSE GENERATOR CHANGE FOR DEEP BRAIN STIMULATOR (Bilateral Chest)  Patient Location: PACU  Anesthesia Type:General  Level of Consciousness: awake, alert  and oriented  Airway & Oxygen Therapy: Patient Spontanous Breathing and Patient connected to nasal cannula oxygen  Post-op Assessment: Report given to RN, Post -op Vital signs reviewed and stable and Patient moving all extremities X 4  Post vital signs: Reviewed and stable  Last Vitals:  Vitals Value Taken Time  BP    Temp    Pulse 94 07/14/2017  4:18 PM  Resp 13 07/14/2017  4:18 PM  SpO2 100 % 07/14/2017  4:18 PM  Vitals shown include unvalidated device data.  Last Pain:  Vitals:   07/14/17 1245  TempSrc:   PainSc: 0-No pain      Patients Stated Pain Goal: 3 (37/94/32 7614)  Complications: No apparent anesthesia complications

## 2017-07-14 NOTE — Anesthesia Procedure Notes (Signed)
Procedure Name: LMA Insertion Date/Time: 07/14/2017 2:20 PM Performed by: Neldon Newport, CRNA Pre-anesthesia Checklist: Timeout performed, Patient being monitored, Suction available, Emergency Drugs available and Patient identified Patient Re-evaluated:Patient Re-evaluated prior to induction Oxygen Delivery Method: Circle system utilized Preoxygenation: Pre-oxygenation with 100% oxygen Induction Type: IV induction Ventilation: Mask ventilation without difficulty LMA: LMA inserted LMA Size: 4.0 Number of attempts: 1 Placement Confirmation: positive ETCO2 and breath sounds checked- equal and bilateral Tube secured with: Tape Dental Injury: Teeth and Oropharynx as per pre-operative assessment

## 2017-07-14 NOTE — Op Note (Signed)
07/14/2017  3:36 PM  PATIENT:  [name]  67 y.o. male  PRE-OPERATIVE DIAGNOSIS:  END OF BATTERY LIFE OF DEEP BRAIN STIMULATORS  POST-OPERATIVE DIAGNOSIS:  END OF BATTERY LIFE OF DEEP BRAIN STIMULATORS  PROCEDURE:  Procedure(s): BILATERAL IMPLANTED PULSE GENERATOR CHANGE FOR DEEP BRAIN STIMULATOR (Bilateral)  SURGEON:  Surgeon(s) and Role:    * Erline Levine, MD - Primary  PHYSICIAN ASSISTANT:   ASSISTANTS: [place], RN   ANESTHESIA:   general  EBL:  5 mL   BLOOD ADMINISTERED:none  DRAINS: none   LOCAL MEDICATIONS USED:  LIDOCAINE   SPECIMEN:  No Specimen  DISPOSITION OF SPECIMEN:  N/A  COUNTS:  YES  TOURNIQUET:  * No tourniquets in log *  DICTATION: DICTATION: Patient has implanted subthalamic stimulator electrodes and IPGs, which are now depleted.  It was elected for patient to undergo bilateral IPG revision.  PROCEDURE: Patient was brought to the operating room and given intravenous sedation.  Bilateral upper chest was prepped with betadine scrub and Duraprep.  Area of planned incision was infiltrated with lidocaine.  Prior incisions were reopened and the old IPGs were externalized.   New IPG which was placed in each pocket.  Wounds were irrigated with vancomycin. Then irrigated once more.  Impedances were checked and were within range.  Incisions were closed with 2-0 Vicryl and 3-0 vicryl sutures and dressed with a sterile occlusive dressing (Dermabond and Honeycomb dressings).  Counts were correct at the end of the case.  PLAN OF CARE: Discharge to home after PACU  PATIENT DISPOSITION:  PACU - hemodynamically stable.   Delay start of Pharmacological VTE agent (>24hrs) due to surgical blood loss or risk of bleeding: yes

## 2017-07-14 NOTE — H&P (Signed)
Patient ID:   [id number] Patient: [name]  Date of Birth: 01/02/1951 Visit Type: Office Visit   Date: 07/07/2017 12:45 PM Provider: Marchia Meiers. Vertell Limber MD   This 67 year old male presents for Parkinson's Disease.  HISTORY OF PRESENT ILLNESS:  1.  Parkinson's Disease  [name], 67 year old male followed by Drs. Pool and Bartco for cervical and lumbar symptoms and Dr. Carles Collet for Parkinson's.  He was last seen by Dr. Vertell Limber in 2015 for deep brain stimulator placement.  DBS has proven beneficial.  He visits today to discuss replacement of depleted implanted pulse generators.  History:  Mild HTN, skin cancer, bradycardia history, depression/PTSD, Parkinson's Surgical history:  Bilateral DBS at [facility] 12/13/2013, cholecystectomy July 2016 or 2017?, lumbar laminectomies and fusion by Dr. Annette Stable most recently 2016, cervical injections by [facility] 2018-2019  The patient has been falling more frequently and is otherwise doing reasonably well.  His implanted pulse generators are now depleted and he seeks revision of these.         Medical/Surgical/Interim History Reviewed, no change.     PAST MEDICAL HISTORY, SURGICAL HISTORY, FAMILY HISTORY, SOCIAL HISTORY AND REVIEW OF SYSTEMS I have reviewed the patient's past medical, surgical, family and social history as well as the comprehensive review of systems as included on the [facility] history form dated 05/17/2017, which I have signed.  Family History:  Reviewed, no changes.  Last detailed document date:11/25/2016.   Social History: Reviewed, no changes. Last detailed document date: 11/25/2016.    MEDICATIONS: (added, continued or stopped this visit) Started Medication Directions Instruction Stopped   carbidopa ER 25 mg-levodopa 100 mg tablet,extended release     11/30/2016 gabapentin 100 mg capsule take 2 capsule by oral route 3 times every day     gabapentin 300 mg capsule      hydrochlorothiazide 25 mg  tablet      naproxen 500 mg tablet      oxycodone-acetaminophen 5 mg-325 mg tablet     05/28/2014 Percocet 5 mg-325 mg tablet take 1-2  tablet by oral route  every 6 hours as needed     pramipexole 0.5 mg tablet      pravastatin 20 mg tablet      selegiline 5 mg capsule        ALLERGIES: Ingredient Reaction Medication Name Comment  MORPHINE Hallucinations    CLONAZEPAM Depression Klonopin   PREDNISONE Unknown     Reviewed, no changes.   REVIEW OF SYSTEMS   See scanned patient registration form, dated 05/17/2017, signed and dated on 07/07/2017  Review of Systems Details System Neg/Pos Details  Constitutional Negative Chills, Fatigue, Fever, Malaise, Night sweats, Weight gain and Weight loss.  ENMT Negative Ear drainage, Hearing loss, Nasal drainage, Otalgia, Sinus pressure and Sore throat.  Eyes Negative Eye discharge, Eye pain and Vision changes.  Respiratory Negative Chronic cough, Cough, Dyspnea, Known TB exposure and Wheezing.  Cardio Negative Chest pain, Claudication, Edema and Irregular heartbeat/palpitations.  GI Negative Abdominal pain, Blood in stool, Change in stool pattern, Constipation, Decreased appetite, Diarrhea, Heartburn, Nausea and Vomiting.  GU Negative Dribbling, Dysuria, Erectile dysfunction, Hematuria, Polyuria (Genitourinary), Slow stream, Urinary frequency, Urinary incontinence and Urinary retention.  Endocrine Negative Cold intolerance, Heat intolerance, Polydipsia and Polyphagia.  Neuro Positive Gait disturbance, Memory impairment, Tremors, Parkinsons.  Psych Positive Anxiety, Depression.  Integumentary Negative Brittle hair, Brittle nails, Change in shape/size of mole(s), Hair loss, Hirsutism, Hives, Pruritus, Rash and Skin lesion.  MS Positive Back pain,  Neck pain.  Hema/Lymph Negative Easy bleeding, Easy bruising and Lymphadenopathy.  Allergic/Immuno Negative Contact allergy, Environmental allergies, Food allergies and Seasonal allergies.   Reproductive Negative Penile discharge and Sexual dysfunction.   PHYSICAL EXAM:   Vitals Date Temp F BP Pulse Ht In Wt Lb BMI BSA Pain Score  07/07/2017  130/78 54 69 185 27.32  3/10    PHYSICAL EXAM Details General Level of Distress: no acute distress Overall Appearance: normal  Head and Face  Right Left  Fundoscopic Exam:  normal normal    Cardiovascular Cardiac: regular rate and rhythm without murmur  Right Left  Carotid Pulses: normal normal  Respiratory Lungs: clear to auscultation  Neurological Orientation: normal Recent and Remote Memory: normal Attention Span and Concentration:   normal Language: normal Fund of Knowledge: normal  Right Left Sensation: normal normal Upper Extremity Coordination: normal normal  Lower Extremity Coordination: normal normal  Musculoskeletal Gait and Station: normal  Right Left Upper Extremity Muscle Strength: normal normal Lower Extremity Muscle Strength: normal normal Upper Extremity Muscle Tone:  normal normal Lower Extremity Muscle Tone: normal normal   Motor Strength Upper and lower extremity motor strength was tested in the clinically pertinent muscles.     Deep Tendon Reflexes  Right Left Biceps: normal normal Triceps: normal normal Brachioradialis: normal normal Patellar: normal normal Achilles: normal normal  Cranial Nerves II. Optic Nerve/Visual Fields: normal III. Oculomotor: normal IV. Trochlear: normal V. Trigeminal: normal VI. Abducens: normal VII. Facial: normal VIII. Acoustic/Vestibular: normal IX. Glossopharyngeal: normal X. Vagus: normal XI. Spinal Accessory: normal XII. Hypoglossal: normal  Motor and other Tests Lhermittes: negative Rhomberg: negative Pronator drift: absent     Right Left Hoffman's: normal normal Clonus: normal normal Babinski: normal normal   Additional Findings:  Mild bradycardia and rigidity.     IMPRESSION:   The patient is stable with regard to  his Parkinson's.  He is going to have revision of both deep brain stimulator implanted pulse generators.  We have scheduled this for 07/14/2017 at [facility]:   The patient will undergo bilateral deep brain stimulator battery revision on 07/14/2017 for deep brain stimulator for Parkinson's.   Orders: Instruction(s)/Education: Assessment Instruction  I10 Hypertension education  Z68.27 Lifestyle education regarding diet   Completed Orders (this encounter) Order Details Reason Side Interpretation Result Initial Treatment Date Region  Lifestyle education regarding diet Patient encouraged to eat a well balanced diet.        Hypertension education Patient to follow up with primary care provider.         Assessment/Plan   # Detail Type Description   1. Assessment Parkinson disease (G20).       2. Assessment End of battery life of deep brain stimulator (Z45.42).       3. Assessment Body mass index (BMI) 27.0-27.9, adult (Z61.09).   Plan Orders Today's instructions / counseling include(s) Lifestyle education regarding diet.       4. Assessment Essential (primary) hypertension (I10).         Pain Management Plan Pain Scale: 3/10. Method: Numeric Pain Intensity Scale. Location: neck and back. Onset: 09/30/2009. Duration: varies. Quality: discomforting. Pain management follow-up plan of care: Patient taking medication as prescribed..  Fall Risk Plan The patient has fallen 6 times in the last year.  Falls risk follow-up plan of care: Assisted devices: Advised to use safety measures when avab.Marland Kitchen              Provider:  Vertell Limber MD, Broadus John  D 07/08/2017 1:22 PM  Dictation edited by: Marchia Meiers. Vertell Limber    CC Providers: [place] at [facility] [place],    [id number]-   Henry Pool MD  [address] [place], [place] [address]              Electronically signed by Marchia Meiers. Vertell Limber MD on 07/08/2017  01:22 PM

## 2017-07-14 NOTE — Brief Op Note (Signed)
07/14/2017  3:36 PM  PATIENT:  [name]  67 y.o. male  PRE-OPERATIVE DIAGNOSIS:  END OF BATTERY LIFE OF DEEP BRAIN STIMULATORS  POST-OPERATIVE DIAGNOSIS:  END OF BATTERY LIFE OF DEEP BRAIN STIMULATORS  PROCEDURE:  Procedure(s): BILATERAL IMPLANTED PULSE GENERATOR CHANGE FOR DEEP BRAIN STIMULATOR (Bilateral)  SURGEON:  Surgeon(s) and Role:    * Erline Levine, MD - Primary  PHYSICIAN ASSISTANT:   ASSISTANTS: Poteat, RN   ANESTHESIA:   general  EBL:  5 mL   BLOOD ADMINISTERED:none  DRAINS: none   LOCAL MEDICATIONS USED:  LIDOCAINE   SPECIMEN:  No Specimen  DISPOSITION OF SPECIMEN:  N/A  COUNTS:  YES  TOURNIQUET:  * No tourniquets in log *  DICTATION: DICTATION: Patient has implanted subthalamic stimulator electrodes and IPGs, which are now depleted.  It was elected for patient to undergo bilateral IPG revision.  PROCEDURE: Patient was brought to the operating room and given intravenous sedation.  Bilateral upper chest was prepped with betadine scrub and Duraprep.  Area of planned incision was infiltrated with lidocaine.  Prior incisions were reopened and the old IPGs were externalized.   New IPG which was placed in each pocket.  Wounds were irrigated with vancomycin. Then irrigated once more.  Impedances were checked and were within range.  Incisions were closed with 2-0 Vicryl and 3-0 vicryl sutures and dressed with a sterile occlusive dressing (Dermabond and Honeycomb dressings).  Counts were correct at the end of the case.  PLAN OF CARE: Discharge to home after PACU  PATIENT DISPOSITION:  PACU - hemodynamically stable.   Delay start of Pharmacological VTE agent (>24hrs) due to surgical blood loss or risk of bleeding: yes

## 2017-07-14 NOTE — Anesthesia Preprocedure Evaluation (Addendum)
Anesthesia Evaluation  Patient identified by MRN, date of birth, ID band Patient awake    Reviewed: Allergy & Precautions, NPO status , Patient's Chart, lab work & pertinent test results  Airway Mallampati: II  TM Distance: >3 FB Neck ROM: Limited    Dental  (+) Teeth Intact, Dental Advidsory Given   Pulmonary shortness of breath, sleep apnea ,  OSA--confirmed by sleep study, score is 16 and unable to tolerate any of the CPAP masks.   breath sounds clear to auscultation       Cardiovascular Exercise Tolerance: Poor hypertension, Pt. on medications (-) dysrhythmias  Rhythm:Regular Rate:Bradycardia  Bradycardic. Balance and gait problems d/t Parkinson's. Does not do stairs. ECG suggests old inferior MI. No hx of this from Mr. Skeet.   Neuro/Psych  Headaches, PSYCHIATRIC DISORDERS Depression Advanced Parkinson's with deep brain stimulator and the usual medications.  Neuromuscular disease    GI/Hepatic GERD  Medicated and Controlled,Acute cholecystitis. Abnormal swallowing study in the past. Reflux--not well controlled. Nausea earlier in this admission, not today.   Endo/Other    Renal/GU      Musculoskeletal  (+) Arthritis ,   Abdominal (+) + obese,  Abdomen: soft.    Peds  Hematology   Anesthesia Other Findings Parkinson's , PTSD, skin Cancer  Reproductive/Obstetrics                            Anesthesia Physical  Anesthesia Plan  ASA: III and emergent  Anesthesia Plan: General   Post-op Pain Management:    Induction: Intravenous  PONV Risk Score and Plan: 2 and Ondansetron and Midazolam  Airway Management Planned: Simple Face Mask  Additional Equipment:   Intra-op Plan:   Post-operative Plan:   Informed Consent: I have reviewed the patients History and Physical, chart, labs and discussed the procedure including the risks, benefits and alternatives for the proposed  anesthesia with the patient or authorized representative who has indicated his/her understanding and acceptance.   Dental Advisory Given  Plan Discussed with: CRNA and Anesthesiologist  Anesthesia Plan Comments:      Anesthesia Quick Evaluation

## 2017-07-14 NOTE — Interval H&P Note (Signed)
History and Physical Interval Note:  07/14/2017 2:12 PM  [name]  has presented today for surgery, with the diagnosis of END OF BATTERY LIFE OF DEEP BRAIN STIMULATORS  The various methods of treatment have been discussed with the patient and family. After consideration of risks, benefits and other options for treatment, the patient has consented to  Procedure(s): [place] (Bilateral) as a surgical intervention .  The patient's history has been reviewed, patient examined, no change in status, stable for surgery.  I have reviewed the patient's chart and labs.  Questions were answered to the patient's satisfaction.     Lakethia Coppess D

## 2017-07-14 NOTE — Progress Notes (Signed)
Patient ID: [name], male   DOB: 08/23/1950, 67 y.o.   MRN: [id number] Awakens to voice. MAEW. Denies pain. Deep Brain Stimulator "on" with good tremor control bilaterally. Tramadol 50mg  po prn for home use, Rx given to family. Ok per DrStern to d/c to home when ambulatory.

## 2017-07-15 NOTE — Anesthesia Postprocedure Evaluation (Signed)
Anesthesia Post Note  Patient: [name]  Procedure(s) Performed: BILATERAL IMPLANTED PULSE GENERATOR CHANGE FOR DEEP BRAIN STIMULATOR (Bilateral Chest)     Patient location during evaluation: PACU Anesthesia Type: General Level of consciousness: awake and alert Pain management: pain level controlled Vital Signs Assessment: post-procedure vital signs reviewed and stable Respiratory status: spontaneous breathing, nonlabored ventilation and respiratory function stable Cardiovascular status: blood pressure returned to baseline and stable Postop Assessment: no apparent nausea or vomiting Anesthetic complications: no    Last Vitals:  Vitals:   07/14/17 1750 07/14/17 1800  BP: 124/75   Pulse: (!) 49 (!) 45  Resp: 12 12  Temp: 36.7 C   SpO2: 94% 96%    Last Pain:  Vitals:   07/14/17 1617  TempSrc:   PainSc: 0-No pain                 Lynda Rainwater

## 2017-07-17 ENCOUNTER — Encounter (HOSPITAL_COMMUNITY): Payer: Self-pay | Admitting: Neurosurgery

## 2017-07-18 ENCOUNTER — Ambulatory Visit: Payer: Self-pay | Admitting: Internal Medicine

## 2017-07-19 ENCOUNTER — Ambulatory Visit: Payer: Medicare PPO | Attending: Neurology

## 2017-07-19 ENCOUNTER — Ambulatory Visit
Admission: RE | Admit: 2017-07-19 | Discharge: 2017-07-19 | Disposition: A | Discharge status: Home / Self Care | Payer: Medicare PPO | Source: Ambulatory Visit | Attending: Neurology | Admitting: Neurology

## 2017-07-19 DIAGNOSIS — R279 Unspecified lack of coordination: Secondary | ICD-10-CM | POA: Insufficient documentation

## 2017-07-19 DIAGNOSIS — R2681 Unsteadiness on feet: Secondary | ICD-10-CM | POA: Diagnosis not present

## 2017-07-19 DIAGNOSIS — R1310 Dysphagia, unspecified: Secondary | ICD-10-CM | POA: Diagnosis not present

## 2017-07-19 DIAGNOSIS — R278 Other lack of coordination: Secondary | ICD-10-CM | POA: Diagnosis not present

## 2017-07-19 DIAGNOSIS — R296 Repeated falls: Secondary | ICD-10-CM | POA: Diagnosis not present

## 2017-07-19 DIAGNOSIS — R1312 Dysphagia, oropharyngeal phase: Secondary | ICD-10-CM | POA: Insufficient documentation

## 2017-07-19 DIAGNOSIS — R2689 Other abnormalities of gait and mobility: Secondary | ICD-10-CM | POA: Insufficient documentation

## 2017-07-19 DIAGNOSIS — M6281 Muscle weakness (generalized): Secondary | ICD-10-CM | POA: Diagnosis not present

## 2017-07-19 NOTE — Therapy (Signed)
[place] MAIN [facility] SERVICES [address] [place], [place], [address] Phone: [phone]   Fax:  [phone]  Physical Therapy Treatment  Patient Details  Name: [name] MRN: [id number] Date of Birth: 1950-06-06 Referring Provider: Alonza Bogus, DO   Encounter Date: 07/19/2017  PT End of Session - 07/19/17 1737    Visit Number  29    Number of Visits  49    Date for PT Re-Evaluation  09/13/17    PT Start Time  1430    PT Stop Time  3220    PT Time Calculation (min)  44 min    Equipment Utilized During Treatment  Gait belt    Activity Tolerance  Patient tolerated treatment well;Treatment limited secondary to medical complications (Comment)    Behavior During Therapy  [facility] for tasks assessed/performed       Past Medical History:  Diagnosis Date  . Arthritis   . Bradycardia   . Cancer [facility]) 2013   skin cancer  . Depression    ptsd  . Dysrhythmia    chronic slow heart rate  . GERD (gastroesophageal reflux disease)   . Headache(784.0)    tension headaches non recent  . History of chicken pox   . History of kidney stones    passed  . Hypertension    treated with HCTZ  . Parkinson's disease ([place])    dx'ed 15 years ago  . PTSD (post-traumatic stress disorder)   . Shortness of breath dyspnea   . Sleep apnea    doesn't use C-pap  . Varicose veins     Past Surgical History:  Procedure Laterality Date  . CHOLECYSTECTOMY N/A 10/22/2014   Procedure: LAPAROSCOPIC CHOLECYSTECTOMY WITH INTRAOPERATIVE CHOLANGIOGRAM;  Surgeon: Dia Crawford III, MD;  Location: [facility] ORS;  Service: General;  Laterality: N/A;  . cyst removed      from lip as a child  . LUMBAR LAMINECTOMY/DECOMPRESSION MICRODISCECTOMY Bilateral 12/14/2012   Procedure: Bilateral lumbar three-four, four-five decompressive laminotomy/foraminotomy;  Surgeon: Charlie Pitter, MD;  Location: [place] [facility] ORS;  Service: Neurosurgery;  Laterality: Bilateral;  . PULSE GENERATOR IMPLANT  Bilateral 12/13/2013   Procedure: Bilateral implantable pulse generator placement;  Surgeon: Erline Levine, MD;  Location: [place] [facility];  Service: Neurosurgery;  Laterality: Bilateral;  Bilateral implantable pulse generator placement  . skin cancer removed     from ears,   12 lft arm  rt leg 15  . SUBTHALAMIC STIMULATOR BATTERY REPLACEMENT Bilateral 07/14/2017   Procedure: BILATERAL IMPLANTED PULSE GENERATOR CHANGE FOR DEEP BRAIN STIMULATOR;  Surgeon: Erline Levine, MD;  Location: [place];  Service: Neurosurgery;  Laterality: Bilateral;  . SUBTHALAMIC STIMULATOR INSERTION Bilateral 12/06/2013   Procedure: SUBTHALAMIC STIMULATOR INSERTION;  Surgeon: Erline Levine, MD;  Location: [place] NEUR[facility] ORS;  Service: Neurosurgery;  Laterality: Bilateral;  Bilateral deep brain stimulator placement    There were no vitals filed for this visit.  Subjective Assessment - 07/19/17 1434    Subjective  Patient had DBS replacement Friday due to low batteries on L and R side. Slight pain in incision area but no residual problems. Reports one fall since monday but averages 4 falls a week.     Pertinent History  Patient referred for physical therapy due to wife's request for patient to get aquatic therapy to help with his conditioning. Pt is also complaining of chronic low back and R hip pain and frequent falls. 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 The last time he was in the water he was very uncomfortable because he didn't feel like he had any control. Pt has a history of 2 low back surgeries in August 2014 and February 2016. Pt and wife are unable to recall exactly what was performed but they know that he had stenosis. Surgeries were performed by Dr. Pool in [place]. Pt reports that his low back and R hip pain are constant. He describes the pain in the back as "dull" and the pain in the hip as "sharp." For both back and hip worst pain is 10/10, Best: 2/10, Present:  2/10. No particular episode of trauma to his low back or hip preceding the pain. Pt has a history of Parkinson's with a deep brain stimulator. He ambulates with a 4 wheeled walker called a U-step (automatic brakes, audio cues for stepping). He states that he falls frequently and reports that he has had approximately 40-50 falls in the last 6 months. No specific recent changes in his health.     Limitations  Walking;Standing;Sitting    How long can you sit comfortably?  30 minutes    How long can you stand comfortably?  5 minutes    How long can you walk comfortably?  10 minutes    Patient Stated Goals  Improve comfort in the water, improve balance, decrease back and R hip pain    Currently in Pain?  No/denies      5x STS: unsafe sit to stands; deferred at this time.  TUG: 23 seconds:  Unsafe sit to stand: excessive posterior LOB x3 trials, required discussion about carryover between sessions  Turning with walker, placed feet outside of walker despite verbal cueing, required discussion about carryover of interventions.  MODI: 50%; back pain increased in the past few weeks, right hip ; assess R hip   Sit to stand from chair x 8 repetition with focus on safe hand placement and trunk positioning  BERG: 41/56  Ambulating with walker 60 ft, crossing LE's with ambulation , max verbal cueing for keeping legs inside walker   Patient goal: get better at getting out of a seat and walking   [facility] PT Assessment - 07/19/17 0001      Berg Balance Test   Sit to Stand  Able to stand using hands after several tries    Standing Unsupported  Able to stand safely 2 minutes    Sitting with Back Unsupported but Feet Supported on Floor or Stool  Able to sit safely and securely 2 minutes    Stand to Sit  Controls descent by using hands    Transfers  Able to transfer safely, definite need of hands    Standing Unsupported with Eyes Closed  Able to stand 10 seconds with supervision    Standing Ubsupported with  Feet Together  Able to place feet together independently and stand 1 minute safely    From Standing, Reach Forward with Outstretched Arm  Can reach forward >12 cm safely (5")    From Standing Position, Pick up Object from Floor  Able to pick up shoe, needs supervision    From Standing Position, Turn to Look Behind Over each Shoulder  Looks behind from both sides and weight shifts well    Turn 360 Degrees  Able to turn 360 degrees safely but slowly    Standing Unsupported, Alternately Place Feet on Step/Stool  Able to complete 4 steps without aid or supervision      Standing Unsupported, One Foot in Front  Able to take small step independently and hold 30 seconds    Standing on One Leg  Able to lift leg independently and hold equal to or more than 3 seconds    Total Score  41        4/3 deffered at this time due to unsafe postural LOB while recovering from surgery                     PT Education - 07/19/17 1736    Education provided  Yes    Education Details  HEP compliance, carryover between therapy and home, functional progression, POC     Person(s) Educated  Patient;Spouse    Methods  Explanation;Demonstration;Verbal cues    Comprehension  Verbalized understanding;Returned demonstration;Verbal cues required;Need further instruction       PT Short Term Goals - 07/19/17 1741      PT SHORT TERM GOAL #1   Title  Pt will perform HEP with family's supervision, for improved balance, transfers, and gait.      Baseline  patient reports compliance, however not every day    Time  4    Period  Weeks    Status  Partially Met      PT SHORT TERM GOAL #2   Title  Pt will perform 5x sit<>stand in less than or equal to 15 seconds for improved efficiency and safety with transfers.    Baseline  19 seconds; deffered at this time due to excessive LOB     Time  2    Period  Weeks    Status  On-going        PT Long Term Goals - 07/19/17 1742      PT LONG TERM GOAL #1   Title   Pt will decrease 5TSTS by at least 3 seconds in order to demonstrate clinically significant improvement in LE strength     Baseline  02/08/17: 20.1 seconds 12/18: 17 seconds ;    Time  8    Period  Weeks    Status  Achieved      PT LONG TERM GOAL #2   Title  Pt will decrease TUG by at least 3.4s in order to demonstrate decreased fall risk     Baseline  02/08/17: 22.1s 12/18: 20 seconds 2/7: 19 seconds; 4/3: 23 seconds with walker    Time  8    Period  Weeks    Status  Partially Met    Target Date  09/13/17      PT LONG TERM GOAL #3   Title  Pt will improve BERG by at least 3 points in order to demonstrate clinically significant improvement in balance.    Baseline  02/08/17: 30/56 12/18: 38/56      Time  8    Period  Weeks    Status  Achieved      PT LONG TERM GOAL #4   Title  Pt/wife will verbalize understanding of plans for continued community fitness upon D/C from PT including aquatic therapy program    Baseline  02/08/17: pt currently reports being more comfortable in the water    Time  8    Period  Weeks    Status  Partially Met    Target Date  09/13/17      PT LONG TERM GOAL #5   Title  Pt will decrease mODI scoreby at least 13 points in order demonstrate clinically significant reduction in   pain/disability     Baseline  02/08/17: 54% 12/18: 46% 2/7: 42% 4/3; 50%    Time  8    Period  Weeks    Status  Partially Met    Target Date  09/13/17      PT LONG TERM GOAL #6   Title  Pt will improve BERG to 44/56  in order to demonstrate clinically significant improvement in balance for decreased fall risk    Baseline  12/18: 38/56 2/7: 41/56 4/3: 41/56    Time  8    Period  Weeks    Status  Partially Met    Target Date  09/13/17            Plan - 07/19/17 1741    Clinical Impression Statement  Patient recovering from recent surgery limiting progression towards goals at this time. When patient demonstrates concentration and focus on task no episodes of LOB occur and  smooth coordinated movements are noted, however when patient acts prior to thinking or becomes distracted excessive posterior trunk sway resulting in posterior LOB noted. Patient will benefit on additional focus to sit to stand transfer into ambulation to decrease falls at home. Patient noted R hip pain with weightbearing which would benefit from further attention. Patient will continue to benefit from skilled physical therapy at this time to improve static and dynamic standing balance and decrease fall risk for improved functional capacity of activities.     Rehab Potential  Fair    PT Frequency  2x / week    PT Duration  8 weeks    PT Treatment/Interventions  ADLs/Self Care Home Management;Aquatic Therapy;Cryotherapy;Electrical Stimulation;Iontophoresis 4mg/ml Dexamethasone;Moist Heat;Ultrasound;Contrast Bath;DME Instruction;Gait training;Stair training;Functional mobility training;Therapeutic activities;Therapeutic exercise;Balance training;Neuromuscular re-education;Patient/family education;Manual techniques;Wheelchair mobility training;Energy conservation;Passive range of motion    PT Next Visit Plan  progress balance, strength, aquatic therapy    PT Home Exercise Plan  None provided at this time until further assessed, wife reports pt does not perform current HEP from HH PT    Consulted and Agree with Plan of Care  Patient;Family member/caregiver    Family Member Consulted  wife       Patient will benefit from skilled therapeutic intervention in order to improve the following deficits and impairments:  Abnormal gait, Decreased balance, Decreased coordination, Decreased mobility, Impaired tone, Postural dysfunction  Visit Diagnosis: Muscle weakness (generalized)  Repeated falls  Other abnormalities of gait and mobility  Unspecified lack of coordination     Problem List Patient Active Problem List   Diagnosis Date Noted  . 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 Radicular pain in  right arm 10/09/2016  . Rash 09/03/2016  . GERD (gastroesophageal reflux disease) 07/31/2016  . Trochanteric bursitis 03/03/2016  . Left hand pain 10/30/2015  . Fracture, finger, multiple sites 10/30/2015  . Colon cancer screening 07/23/2015  . Healthcare maintenance 07/23/2015  . Gout 02/18/2015  . Depression 01/06/2015  . Irritation of eyelid 08/20/2014  . Dupuytren's contracture 08/20/2014  . Cough 07/21/2014  . Lumbar stenosis with neurogenic claudication 06/13/2014  . Preop cardiovascular exam 05/28/2014  . S/P deep brain stimulator placement 05/08/2014  . Joint pain 01/22/2014  . Medicare annual wellness visit, initial 01/19/2014  . Advance care planning 01/19/2014  . Parkinson's disease ([facility]) 12/13/2013  . PTSD (post-traumatic stress disorder) 06/13/2013  . Erectile dysfunction 06/13/2013  . HLD (hyperlipidemia) 06/13/2013  . Hip pain 06/10/2013  . Pain in joint, shoulder region 06/10/2013  . Right leg swelling 06/03/2013  . Essential hypertension   06/03/2013  . Bradycardia by electrocardiogram 06/03/2013  . Obstructive sleep apnea 03/12/2013  . Spinal stenosis, lumbar region, with neurogenic claudication 12/14/2012   Janna Arch, PT, DPT   Janna Arch 07/19/2017, 5:45 PM  [place] MAIN [facility] SERVICES [address] [place], [place], [address] Phone: [phone]   Fax:  [phone]  Name: [name] MRN: [id number] Date of Birth: 1951-03-14

## 2017-07-19 NOTE — Therapy (Signed)
[place], [place], [address] Phone: [phone]   Fax:     Modified Barium Swallow  Patient Details  Name: [name] MRN: [id number] Date of Birth: 17-Apr-1951 No data recorded  Encounter Date: 07/19/2017  End of Session - 07/19/17 1515    Visit Number  1    Number of Visits  1    Date for SLP Re-Evaluation  07/19/17    SLP Start Time  15    SLP Stop Time   1400    SLP Time Calculation (min)  60 min    Activity Tolerance  Patient tolerated treatment well       Past Medical History:  Diagnosis Date  . Arthritis   . Bradycardia   . Cancer [facility]) 2013   skin cancer  . Depression    ptsd  . Dysrhythmia    chronic slow heart rate  . GERD (gastroesophageal reflux disease)   . Headache(784.0)    tension headaches non recent  . History of chicken pox   . History of kidney stones    passed  . Hypertension    treated with HCTZ  . Parkinson's disease ([place])    dx'ed 15 years ago  . PTSD (post-traumatic stress disorder)   . Shortness of breath dyspnea   . Sleep apnea    doesn't use C-pap  . Varicose veins     Past Surgical History:  Procedure Laterality Date  . CHOLECYSTECTOMY N/A 10/22/2014   Procedure: LAPAROSCOPIC CHOLECYSTECTOMY WITH INTRAOPERATIVE CHOLANGIOGRAM;  Surgeon: Dia Crawford III, MD;  Location: [facility] ORS;  Service: General;  Laterality: N/A;  . cyst removed      from lip as a child  . LUMBAR LAMINECTOMY/DECOMPRESSION MICRODISCECTOMY Bilateral 12/14/2012   Procedure: Bilateral lumbar three-four, four-five decompressive laminotomy/foraminotomy;  Surgeon: Charlie Pitter, MD;  Location: [place] NEURO ORS;  Service: Neurosurgery;  Laterality: Bilateral;  . PULSE GENERATOR IMPLANT Bilateral 12/13/2013   Procedure: Bilateral implantable pulse generator placement;  Surgeon: Erline Levine, MD;  Location: [place] NEURO ORS;  Service: Neurosurgery;  Laterality: Bilateral;  Bilateral implantable pulse  generator placement  . skin cancer removed     from ears,   12 lft arm  rt leg 15  . SUBTHALAMIC STIMULATOR BATTERY REPLACEMENT Bilateral 07/14/2017   Procedure: BILATERAL IMPLANTED PULSE GENERATOR CHANGE FOR DEEP BRAIN STIMULATOR;  Surgeon: Erline Levine, MD;  Location: [place];  Service: Neurosurgery;  Laterality: Bilateral;  . SUBTHALAMIC STIMULATOR INSERTION Bilateral 12/06/2013   Procedure: SUBTHALAMIC STIMULATOR INSERTION;  Surgeon: Erline Levine, MD;  Location: [place] NEURO ORS;  Service: Neurosurgery;  Laterality: Bilateral;  Bilateral deep brain stimulator placement    There were no vitals filed for this visit.    Subjective: Patient behavior: (alertness, ability to follow instructions, etc.):  Patient is able to follow directions with cues; continues to have poor safety awareness and poor concept of the severity of swallowing and speech disorders.  MBS 4/21/019:  67 year old man with Parkinson's disease and moderate pharyngeal dysphagia per MBS 02/05/2014) is presenting mild oropharyngeal dysphagia characterized by delayed pharyngeal swallow initiation, decreased pharyngeal pressure generation (decreased tongue base retraction and reduced pharyngeal wall contraction), decreased hyolaryngeal movement, incomplete epiglottic inversion, and trace to moderate pharyngeal residue.   Residue is primarily in the vallecular space and greater with puree than with chewable or liquid boluses.  There was one episode of trace laryngeal penetration with the middle of 3 rapid consecutive thin liquid  swallows.  This study supports a regular diet with thin liquids- small bites and sips.    Chief complaint: Ongoing coughing/choking with eating and drinking   Objective:  Radiological Procedure: A videoflouroscopic evaluation of oral-preparatory, reflex initiation, and pharyngeal phases of the swallow was performed; as well as a screening of the upper esophageal phase.  I. POSTURE: Upright in MBS chair  II. VIEW:  Lateral  III. COMPENSATORY STRATEGIES: double swallow aid pharyngeal clearance  IV. BOLUSES ADMINISTERED:   Thin Liquid: 1 cup rim, 3 rapid consecutive   Nectar-thick Liquid: 1 moderate   Honey-thick Liquid: DNT   Puree: 2 teaspoon presentations   Mechanical Soft: 1/4 graham cracker in applesauce  V. RESULTS OF EVALUATION: A. ORAL PREPARATORY PHASE: (The lips, tongue, and velum are observed for strength and coordination)       **Overall Severity Rating: Mild; disorganized posterior transfer  B. SWALLOW INITIATION/REFLEX: (The reflex is normal if "triggered" by the time the bolus reached the base of the tongue)  **Overall Severity Rating: Moderate-severe; triggers while falling from the valleculae to the pyriform sinuses  C. PHARYNGEAL PHASE: (Pharyngeal function is normal if the bolus shows rapid, smooth, and continuous transit through the pharynx and there is no pharyngeal residue after the swallow)  **Overall Severity Rating: Moderate; moderately decreased pharyngeal pressure generation (decreased tongue base retraction and hyolaryngeal excursion), incomplete epiglottic inversion, moderate vallecular residue, min pyriform sinus and posterior pharyngeal wall residue  D. LARYNGEAL PENETRATION: (Material entering into the laryngeal inlet/vestibule but not aspirated) X2; 1 transient before initiating first swallow, 1 deeper after first swallow (from residue)- audible  E. ASPIRATION: None  F. ESOPHAGEAL PHASE: (Screening of the upper esophagus): minimal view of cervical esophagus secondary shoulder shadow   ASSESSMENT: 67 year old man with Parkinson's disease, mild oropharyngeal dysphagia (per MBS 08/06/2017), and reported ongoing coughing/choking associated with eating/drinking/dry swallows is presenting with moderate oropharyngeal dysphagia characterized by mildly disorganized oral transfer, moderate-severe delayed pharyngeal swallow initiation, moderately decreased pharyngeal pressure  generation (decreased tongue base retraction and hyolaryngeal excursion), incomplete epiglottic inversion, moderate vallecular residue, min pyriform sinus and posterior pharyngeal wall residue, and 2 episodes of laryngeal penetration with rapid consecutive sips of thin liquid.  These results indicate that the patient continues to have safe swallowing (in terms of aspiration), but his swallow function is declining.  The patient was counseled to soften/moisten foods as needed to be able to maintain a cohesive bolus within the oral cavity, small bites/sips, alternate bites/sips, and frequent double swallows.  He was reminded that the LSVT-LOUD program has been effective in improving swallowing as well as speech intelligibility/loudness.  PLAN/RECOMMENDATIONS:   A. Diet: Regular with thin liquids   B. Swallowing Precautions:   1. Soften and moisten foods as needed so the food doesn't break away or apart in your mouth. 2. Small bites and sips. 3. Single sips of liquid- do not chug or gulp. 4. Alternate bites of food and sips of liquid. 5. Double swallow frequently. 6. Stringent oral care. 7. Don't put food or liquid in your mouth if you are coughing.   C. Recommended consultation to: follow up with MD as recommended   D. Therapy recommendations: patient would benefit from speech therapy if he is agreeable   E. Results and recommendations were discussed with the patient and his wife immediately following the study and the final report routed to the referring MD.   Patient will benefit from skilled therapeutic intervention in order to improve the following deficits and impairments:  Dysphagia, oropharyngeal phase - Plan: DG OP Swallowing Func-Medicare/Speech Path, DG OP Swallowing Func-Medicare/Speech Path        Problem List Patient Active Problem List   Diagnosis Date Noted  . 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 Radicular pain in right arm 10/09/2016  . Rash 09/03/2016  . GERD  (gastroesophageal reflux disease) 07/31/2016  . Trochanteric bursitis 03/03/2016  . Left hand pain 10/30/2015  . Fracture, finger, multiple sites 10/30/2015  . Colon cancer screening 07/23/2015  . Healthcare maintenance 07/23/2015  . Gout 02/18/2015  . Depression 01/06/2015  . Irritation of eyelid 08/20/2014  . Dupuytren's contracture 08/20/2014  . Cough 07/21/2014  . Lumbar stenosis with neurogenic claudication 06/13/2014  . Preop cardiovascular exam 05/28/2014  . S/P deep brain stimulator placement 05/08/2014  . Joint pain 01/22/2014  . Medicare annual wellness visit, initial 01/19/2014  . Advance care planning 01/19/2014  . Parkinson's disease ([place]) 12/13/2013  . PTSD (post-traumatic stress disorder) 06/13/2013  . Erectile dysfunction 06/13/2013  . HLD (hyperlipidemia) 06/13/2013  . Hip pain 06/10/2013  . Pain in joint, shoulder region 06/10/2013  . Right leg swelling 06/03/2013  . Essential hypertension 06/03/2013  . Bradycardia by electrocardiogram 06/03/2013  . Obstructive sleep apnea 03/12/2013  . Spinal stenosis, lumbar region, with neurogenic claudication 12/14/2012   Leroy Sea, MS/CCC- SLP  Lou Miner 07/19/2017, 3:17 PM  [place], [place], [address] Phone: [phone]   Fax:     Name: [name] MRN: [id number] Date of Birth: [birth year]

## 2017-07-25 ENCOUNTER — Ambulatory Visit: Payer: Medicare PPO | Admitting: Psychology

## 2017-07-25 ENCOUNTER — Ambulatory Visit: Payer: Medicare PPO

## 2017-07-25 DIAGNOSIS — R278 Other lack of coordination: Secondary | ICD-10-CM | POA: Diagnosis not present

## 2017-07-25 DIAGNOSIS — R2689 Other abnormalities of gait and mobility: Secondary | ICD-10-CM

## 2017-07-25 DIAGNOSIS — R2681 Unsteadiness on feet: Secondary | ICD-10-CM

## 2017-07-25 DIAGNOSIS — R279 Unspecified lack of coordination: Secondary | ICD-10-CM

## 2017-07-25 DIAGNOSIS — R296 Repeated falls: Secondary | ICD-10-CM

## 2017-07-25 DIAGNOSIS — M6281 Muscle weakness (generalized): Secondary | ICD-10-CM

## 2017-07-25 NOTE — Therapy (Signed)
[place] MAIN [facility] SERVICES [address] [place], [place], [address] Phone: [phone]   Fax:  [phone]  Physical Therapy Treatment  Patient Details  Name: [name] MRN: [id number] Date of Birth: Oct 08, 1950 Referring Provider: Alonza Bogus, DO   Encounter Date: 07/25/2017  PT End of Session - 07/25/17 1232    Visit Number  30    Number of Visits  49    Date for PT Re-Evaluation  09/13/17    PT Start Time  0945    PT Stop Time  1030    PT Time Calculation (min)  45 min    Equipment Utilized During Treatment  Gait belt    Activity Tolerance  Patient tolerated treatment well;Treatment limited secondary to medical complications (Comment)    Behavior During Therapy  [facility] for tasks assessed/performed       Past Medical History:  Diagnosis Date  . Arthritis   . Bradycardia   . Cancer [facility]) 2013   skin cancer  . Depression    ptsd  . Dysrhythmia    chronic slow heart rate  . GERD (gastroesophageal reflux disease)   . Headache(784.0)    tension headaches non recent  . History of chicken pox   . History of kidney stones    passed  . Hypertension    treated with HCTZ  . Parkinson's disease ([place])    dx'ed 15 years ago  . PTSD (post-traumatic stress disorder)   . Shortness of breath dyspnea   . Sleep apnea    doesn't use C-pap  . Varicose veins     Past Surgical History:  Procedure Laterality Date  . CHOLECYSTECTOMY N/A 10/22/2014   Procedure: LAPAROSCOPIC CHOLECYSTECTOMY WITH INTRAOPERATIVE CHOLANGIOGRAM;  Surgeon: Dia Crawford III, MD;  Location: [facility] ORS;  Service: General;  Laterality: N/A;  . cyst removed      from lip as a child  . LUMBAR LAMINECTOMY/DECOMPRESSION MICRODISCECTOMY Bilateral 12/14/2012   Procedure: Bilateral lumbar three-four, four-five decompressive laminotomy/foraminotomy;  Surgeon: Charlie Pitter, MD;  Location: [place] NEURO ORS;  Service: Neurosurgery;  Laterality: Bilateral;  . PULSE GENERATOR IMPLANT  Bilateral 12/13/2013   Procedure: Bilateral implantable pulse generator placement;  Surgeon: Erline Levine, MD;  Location: [place] NEURO ORS;  Service: Neurosurgery;  Laterality: Bilateral;  Bilateral implantable pulse generator placement  . skin cancer removed     from ears,   12 lft arm  rt leg 15  . SUBTHALAMIC STIMULATOR BATTERY REPLACEMENT Bilateral 07/14/2017   Procedure: BILATERAL IMPLANTED PULSE GENERATOR CHANGE FOR DEEP BRAIN STIMULATOR;  Surgeon: Erline Levine, MD;  Location: [place];  Service: Neurosurgery;  Laterality: Bilateral;  . SUBTHALAMIC STIMULATOR INSERTION Bilateral 12/06/2013   Procedure: SUBTHALAMIC STIMULATOR INSERTION;  Surgeon: Erline Levine, MD;  Location: [place] NEURO ORS;  Service: Neurosurgery;  Laterality: Bilateral;  Bilateral deep brain stimulator placement    There were no vitals filed for this visit.  Subjective Assessment - 07/25/17 1000    Subjective  Patient has pain in R hip upon weight bearing. Reports a couple of falls since last session.     Pertinent History  Patient referred for physical therapy due to wife's request for patient to get aquatic therapy to help with his conditioning. Pt is also complaining of chronic low back and R hip pain and frequent falls. 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 The last time he was in the water he was  very uncomfortable because he didn't feel like he had any control. Pt has a history of 2 low back surgeries in August 2014 and February 2016. Pt and wife are unable to recall exactly what was performed but they know that he had stenosis. Surgeries were performed by Dr. Annette Stable in [place]. Pt reports that his low back and R hip pain are constant. He describes the pain in the back as "dull" and the pain in the hip as "sharp." For both back and hip worst pain is 10/10, Best: 2/10, Present: 2/10. No particular episode of trauma to his low back or hip preceding the pain. Pt has a history  of Parkinson's with a deep brain stimulator. He ambulates with a 4 wheeled walker called a U-step (automatic brakes, audio cues for stepping). He states that he falls frequently and reports that he has had approximately 40-50 falls in the last 6 months. No specific recent changes in his health.     Limitations  Walking;Standing;Sitting    How long can you sit comfortably?  30 minutes    How long can you stand comfortably?  5 minutes    How long can you walk comfortably?  10 minutes    Patient Stated Goals  Improve comfort in the water, improve balance, decrease back and R hip pain    Currently in Pain?  No/denies       Assess R hip  Review new HEP R IT band rollout   R piriformis rollout   bridge 5x  Supine hooklying adduction 10x 5 secon dholds   SLR 10x each leg, challenged with knee extension, painful on R hip when raised higher than 45 degrees    TrA contraction 10x 3 second holds   TrA contraction with marches 10x   10x STS practicing feeling back of chair with both legs, trunk angle and momentum. utilization of bilateral legs equally   -excessive posterior lean  10x SPT to chair ; cues for touching chair with both backs of legs prior to reaching back to promote safer transfers and decrease falls    LAQ 5 second holds 10x each leg  Static stand on airex pad: 2x30 seconds    Pt. response to medical necessity: Patient will continue to benefit from skilled physical therapy to improve static and dynamic sanding balance and decrease fall risk.        PT Education - 07/25/17 1232    Education provided  Yes    Education Details  HEP compliance, functional progression, exercise technique     Person(s) Educated  Patient    Methods  Explanation;Demonstration;Verbal cues    Comprehension  Verbalized understanding;Returned demonstration       PT Short Term Goals - 07/19/17 1741      PT SHORT TERM GOAL #1   Title  Pt will perform HEP with family's supervision, for  improved balance, transfers, and gait.      Baseline  patient reports compliance, however not every day    Time  4    Period  Weeks    Status  Partially Met      PT SHORT TERM GOAL #2   Title  Pt will perform 5x sit<>stand in less than or equal to 15 seconds for improved efficiency and safety with transfers.    Baseline  19 seconds; deffered at this time due to excessive LOB     Time  2    Period  Weeks    Status  On-going  PT Long Term Goals - 07/19/17 1742      PT LONG TERM GOAL #1   Title  Pt will decrease 5TSTS by at least 3 seconds in order to demonstrate clinically significant improvement in LE strength     Baseline  02/08/17: 20.1 seconds 12/18: 17 seconds ;    Time  8    Period  Weeks    Status  Achieved      PT LONG TERM GOAL #2   Title  Pt will decrease TUG by at least 3.4s in order to demonstrate decreased fall risk     Baseline  02/08/17: 22.1s 12/18: 20 seconds 2/7: 19 seconds; 4/3: 23 seconds with walker    Time  8    Period  Weeks    Status  Partially Met    Target Date  09/13/17      PT LONG TERM GOAL #3   Title  Pt will improve BERG by at least 3 points in order to demonstrate clinically significant improvement in balance.    Baseline  02/08/17: 30/56 12/18: 38/56      Time  8    Period  Weeks    Status  Achieved      PT LONG TERM GOAL #4   Title  Pt/wife will verbalize understanding of plans for continued community fitness upon D/C from PT including aquatic therapy program    Baseline  02/08/17: pt currently reports being more comfortable in the water    Time  8    Period  Weeks    Status  Partially Met    Target Date  09/13/17      PT LONG TERM GOAL #5   Title  Pt will decrease mODI scoreby at least 13 points in order demonstrate clinically significant reduction in pain/disability     Baseline  02/08/17: 54% 12/18: 46% 2/7: 42% 4/3; 50%    Time  8    Period  Weeks    Status  Partially Met    Target Date  09/13/17      PT LONG TERM GOAL  #6   Title  Pt will improve BERG to 44/56  in order to demonstrate clinically significant improvement in balance for decreased fall risk    Baseline  12/18: 38/56 2/7: 41/56 4/3: 41/56    Time  8    Period  Weeks    Status  Partially Met    Target Date  09/13/17            Plan - 07/25/17 1235    Clinical Impression Statement  Patient presents with R hip pain post fall that is painful upon weight bearing and ambulating. Assessed hip for possible dislocation due to pain beginning after a fall. Patient educated on and performed new HEP. Patient will continue to benefit from skilled physical therapy to improve static and dynamic sanding balance and decrease fall risk.     Rehab Potential  Fair    PT Frequency  2x / week    PT Duration  8 weeks    PT Treatment/Interventions  ADLs/Self Care Home Management;Aquatic Therapy;Cryotherapy;Electrical Stimulation;Iontophoresis '4mg'$ /ml Dexamethasone;Moist Heat;Ultrasound;Contrast Bath;DME Instruction;Gait training;Stair training;Functional mobility training;Therapeutic activities;Therapeutic exercise;Balance training;Neuromuscular re-education;Patient/family education;Manual techniques;Wheelchair mobility training;Energy conservation;Passive range of motion    PT Next Visit Plan  progress balance, strength, aquatic therapy    PT Home Exercise Plan  None provided at this time until further assessed, wife reports pt does not perform current HEP from [facility] PT  Consulted and Agree with Plan of Care  Patient;Family member/caregiver    Family Member Consulted  wife       Patient will benefit from skilled therapeutic intervention in order to improve the following deficits and impairments:  Abnormal gait, Decreased balance, Decreased coordination, Decreased mobility, Impaired tone, Postural dysfunction  Visit Diagnosis: Muscle weakness (generalized)  Repeated falls  Other abnormalities of gait and mobility  Unspecified lack of  coordination  Unsteadiness on feet     Problem List Patient Active Problem List   Diagnosis Date Noted  . 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 Radicular pain in right arm 10/09/2016  . Rash 09/03/2016  . GERD (gastroesophageal reflux disease) 07/31/2016  . Trochanteric bursitis 03/03/2016  . Left hand pain 10/30/2015  . Fracture, finger, multiple sites 10/30/2015  . Colon cancer screening 07/23/2015  . Healthcare maintenance 07/23/2015  . Gout 02/18/2015  . Depression 01/06/2015  . Irritation of eyelid 08/20/2014  . Dupuytren's contracture 08/20/2014  . Cough 07/21/2014  . Lumbar stenosis with neurogenic claudication 06/13/2014  . Preop cardiovascular exam 05/28/2014  . S/P deep brain stimulator placement 05/08/2014  . Joint pain 01/22/2014  . Medicare annual wellness visit, initial 01/19/2014  . Advance care planning 01/19/2014  . Parkinson's disease ([place]) 12/13/2013  . PTSD (post-traumatic stress disorder) 06/13/2013  . Erectile dysfunction 06/13/2013  . HLD (hyperlipidemia) 06/13/2013  . Hip pain 06/10/2013  . Pain in joint, shoulder region 06/10/2013  . Right leg swelling 06/03/2013  . Essential hypertension 06/03/2013  . Bradycardia by electrocardiogram 06/03/2013  . Obstructive sleep apnea 03/12/2013  . Spinal stenosis, lumbar region, with neurogenic claudication 12/14/2012  Janna Arch, PT, DPT    Janna Arch 07/25/2017, 12:37 PM  [place] MAIN [facility] SERVICES [address] [place], [place], [address] Phone: [phone]   Fax:  [phone]  Name: [name] MRN: [id number] Date of Birth: [birth year]

## 2017-07-26 ENCOUNTER — Encounter: Payer: Self-pay | Admitting: Internal Medicine

## 2017-07-26 ENCOUNTER — Ambulatory Visit: Payer: Self-pay

## 2017-07-27 ENCOUNTER — Ambulatory Visit (INDEPENDENT_AMBULATORY_CARE_PROVIDER_SITE_OTHER): Payer: Medicare PPO | Admitting: Internal Medicine

## 2017-07-27 ENCOUNTER — Encounter: Payer: Self-pay | Admitting: Internal Medicine

## 2017-07-27 ENCOUNTER — Ambulatory Visit: Payer: Self-pay | Admitting: Psychology

## 2017-07-27 ENCOUNTER — Other Ambulatory Visit: Payer: Self-pay | Admitting: Family Medicine

## 2017-07-27 DIAGNOSIS — G4752 REM sleep behavior disorder: Secondary | ICD-10-CM

## 2017-07-27 DIAGNOSIS — M10079 Idiopathic gout, unspecified ankle and foot: Secondary | ICD-10-CM

## 2017-07-27 DIAGNOSIS — G4733 Obstructive sleep apnea (adult) (pediatric): Secondary | ICD-10-CM

## 2017-07-27 DIAGNOSIS — E782 Mixed hyperlipidemia: Secondary | ICD-10-CM

## 2017-07-27 MED ORDER — PROTRIPTYLINE HCL 5 MG PO TABS: ORAL_TABLET | ORAL | 0 refills | Status: DC

## 2017-07-27 MED ORDER — TEMAZEPAM 15 MG PO CAPS: ORAL_CAPSULE | ORAL | 0 refills | Status: DC

## 2017-07-27 NOTE — Progress Notes (Signed)
HPI male never smoker followed for OSA, REM Behavior Disorder, complicated by Parkinson's disease followed by neurology/Dr. Tat with neurostimulator and Sinemet---dysphagia and depression are significant, complicated by GERD, dysphagia NPSG 06/17/10- AHI 12/ hr NPSG 02/22/17-AHI 34.4/ hr, oxygen desaturation to 88%,  body weight 180 lbs,   -------------------------------------------------------------------   .11/02/16-67 year old male never smoker with obstructive sleep apnea with REM Behavior Disorder, complicated by Parkinson's disease followed by neurology/Dr. Tat, with neurostimulator and Sinemet-dysphagia and depression significant.  Complicated by GERD, dysphagia NPSG 06/17/10- AHI 12/ hr He had been followed by Dr.Ramachandran, LOV 2017,  at [facility] but indicates he has not worn CPAP in about 2 years. 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 CPAP was uncomfortable and he quit. Stays tired, snores some. RBD in shared bed- actively punches and kicks at her, but not getting out of bed. Little specific dream recall but vivid- worse after DBS placed.Marland Kitchen  Has been better on Welbutrin plus Lexapro and Sinemet, taken at 7PM. [place] not recorded this visit. Clonazepam 0.5 mg caused excessive sedation and depression. Melatonin was mentioned as a possibility, but I can't tell if it was tried. Wife thinks he chokes occ at night. Has GERD and dysphagia. Swallowing eval recommended thickener and small bites but note says "non-compliant".  Bedtime 9-10 P, short latency, WASO 2-3, up 8:30 AM  07/27/17-67 year old male never smoker followed for OSA, REM Behavior Disorder, complicated by Parkinson's disease followed by neurology/Dr. Tat with neurostimulator and Sinemet---dysphagia and depression are significant, complicated by GERD, dysphagia ----OSA: DME: [facility]. Pt wears CPAP nightly and DL attached. Pt  continues to have vivid dreams.  NPSG 02/22/17-AHI 34.4/ hr, oxygen desaturation to 88%,  body weight 180 lbs,  CPAP auto 5-15/Advanced Sinemet 25-100 He has been pulling his CPAP mask off.  Download 60% compliance AHI 6.6/hour.  He does not have enough motor coordination to put his CPAP on by himself.  If she falls asleep first then he does not wear it.  She feels they are doing the best they can given his situation. Saw his neurologist recently without changes.  Wife describes active reaching, vivid dreams and some punching.  They have considered twin beds. Klonopin made him too lethargic next day.  ROS-see HPI  + = pos Constitutional:    weight loss, night sweats, fevers, chills, fatigue, lassitude. HEENT:    headaches, difficulty swallowing, tooth/dental problems, sore throat,       sneezing, itching, ear ache, nasal congestion, post nasal drip, snoring CV:    chest pain, orthopnea, PND, swelling in lower extremities, anasarca,                            dizziness, palpitations   +slow pulse Resp:   shortness of breath with exertion or at rest.                productive cough,   non-productive cough, coughing up of blood.              change in color of mucus.  wheezing.   Skin:    rash or lesions. GI:  No-   heartburn, indigestion, abdominal pain, nausea, vomiting, diarrhea,                 change in bowel habits, loss of appetite GU: dysuria, change in color of urine, no urgency or frequency.  flank pain. MS:   joint pain, stiffness, decreased range of motion, back pain. Neuro-     HPI Psych:  change in mood or affect.  depression or anxiety.   memory loss.  OBJ- Physical Exam General- Alert, Oriented, Affect-appropriate, Distress- none acute, not obese Skin- rash-none, lesions- none, excoriation- none Lymphadenopathy- none Head- atraumatic            Eyes- Gross vision intact, PERRLA, conjunctivae and secretions clear            Ears- Hearing, canals-normal            Nose-  Clear, no-Septal dev, mucus, polyps, erosion, perforation             Throat- Mallampati III , mucosa clear , drainage- none, tonsils- atrophic, own teeth Neck- flexible , trachea midline, no stridor , thyroid nl, carotid no bruit Chest - symmetrical excursion , unlabored           Heart/CV- +slow 49 RRR , no murmur , no gallop  , no rub, nl s1 s2                           - JVD- none , edema- none, stasis changes- none, varices- none           Lung- clear to P&A, wheeze- none, cough- none , dullness-none, rub- none           Chest wall-  Abd-  Br/ Gen/ Rectal- Not done, not indicated Extrem- cyanosis- none, clubbing, none, atrophy- none, strength- nl Neuro- grossly intact to observation, +rolling walker, no tremor noted  .

## 2017-07-27 NOTE — Patient Instructions (Signed)
Scripts printed to try 1, then the other-    Vivactil to try to calm the dreams, temazepam to sleep more soundly. Please let us know if you decide you want a script for either.

## 2017-07-28 ENCOUNTER — Ambulatory Visit: Payer: Medicare PPO

## 2017-07-30 NOTE — Assessment & Plan Note (Addendum)
Clonazepam half-life was too long and caused daytime somnolence.  His neurostimulator helps but vivid dreams and associated movement remain disruptive and are interfering with CPAP use. Plan-separately, try temazepam for shorter half-life to see if this consolidate sleep and calms movement.  Can also try Vivactil which is an old remedy.  It is labeled with stimulant effect but that has not usually interfered when I have tried it in the past.  It may suppress REM- associated vivid dreams.

## 2017-07-30 NOTE — Assessment & Plan Note (Signed)
10 you to work with CPAP, adjusting for comfort we are we are able.  Realizing his RBD causes him to pull mask off. Plan-continue CPAP auto 5-15.  Compliance goals discussed.

## 2017-07-31 ENCOUNTER — Ambulatory Visit: Payer: Medicare PPO

## 2017-07-31 ENCOUNTER — Other Ambulatory Visit: Payer: Self-pay

## 2017-07-31 ENCOUNTER — Ambulatory Visit: Payer: Medicare PPO | Admitting: Occupational Therapy

## 2017-07-31 ENCOUNTER — Encounter: Payer: Self-pay | Admitting: Occupational Therapy

## 2017-07-31 DIAGNOSIS — R2681 Unsteadiness on feet: Secondary | ICD-10-CM | POA: Diagnosis not present

## 2017-07-31 DIAGNOSIS — R278 Other lack of coordination: Secondary | ICD-10-CM

## 2017-07-31 DIAGNOSIS — M6281 Muscle weakness (generalized): Secondary | ICD-10-CM

## 2017-07-31 DIAGNOSIS — R296 Repeated falls: Secondary | ICD-10-CM

## 2017-07-31 DIAGNOSIS — R2689 Other abnormalities of gait and mobility: Secondary | ICD-10-CM

## 2017-07-31 DIAGNOSIS — R279 Unspecified lack of coordination: Secondary | ICD-10-CM | POA: Diagnosis not present

## 2017-07-31 NOTE — Therapy (Signed)
[place] MAIN [facility] SERVICES [address] [place], [place], [address] Phone: [phone]   Fax:  [phone]  Physical Therapy Treatment  Patient Details  Name: [name] MRN: [id number] Date of Birth: 12/10/1950 Referring Provider: Wells Guiles Tat   Encounter Date: 07/31/2017  PT End of Session - 07/31/17 1033    Visit Number  31    Number of Visits  49    Date for PT Re-Evaluation  09/13/17    Authorization Type  3/10 of progress note    PT Start Time  0845    PT Stop Time  0915    PT Time Calculation (min)  30 min    Equipment Utilized During Treatment  Gait belt    Activity Tolerance  Patient tolerated treatment well;Treatment limited secondary to medical complications (Comment)    Behavior During Therapy  [facility] for tasks assessed/performed       Past Medical History:  Diagnosis Date  . Arthritis   . Bradycardia   . Cancer [facility]) 2013   skin cancer  . Depression    ptsd  . Dysrhythmia    chronic slow heart rate  . GERD (gastroesophageal reflux disease)   . Headache(784.0)    tension headaches non recent  . History of chicken pox   . History of kidney stones    passed  . Hypertension    treated with HCTZ  . Parkinson's disease ([place])    dx'ed 15 years ago  . PTSD (post-traumatic stress disorder)   . Shortness of breath dyspnea   . Sleep apnea    doesn't use C-pap  . Varicose veins     Past Surgical History:  Procedure Laterality Date  . CHOLECYSTECTOMY N/A 10/22/2014   Procedure: LAPAROSCOPIC CHOLECYSTECTOMY WITH INTRAOPERATIVE CHOLANGIOGRAM;  Surgeon: Dia Crawford III, MD;  Location: [facility] ORS;  Service: General;  Laterality: N/A;  . cyst removed      from lip as a child  . LUMBAR LAMINECTOMY/DECOMPRESSION MICRODISCECTOMY Bilateral 12/14/2012   Procedure: Bilateral lumbar three-four, four-five decompressive laminotomy/foraminotomy;  Surgeon: Charlie Pitter, MD;  Location: [place] NEURO ORS;  Service: Neurosurgery;  Laterality:  Bilateral;  . PULSE GENERATOR IMPLANT Bilateral 12/13/2013   Procedure: Bilateral implantable pulse generator placement;  Surgeon: Erline Levine, MD;  Location: [place] NEURO ORS;  Service: Neurosurgery;  Laterality: Bilateral;  Bilateral implantable pulse generator placement  . skin cancer removed     from ears,   12 lft arm  rt leg 15  . SUBTHALAMIC STIMULATOR BATTERY REPLACEMENT Bilateral 07/14/2017   Procedure: BILATERAL IMPLANTED PULSE GENERATOR CHANGE FOR DEEP BRAIN STIMULATOR;  Surgeon: Erline Levine, MD;  Location: [place];  Service: Neurosurgery;  Laterality: Bilateral;  . SUBTHALAMIC STIMULATOR INSERTION Bilateral 12/06/2013   Procedure: SUBTHALAMIC STIMULATOR INSERTION;  Surgeon: Erline Levine, MD;  Location: [place] NEURO ORS;  Service: Neurosurgery;  Laterality: Bilateral;  Bilateral deep brain stimulator placement    There were no vitals filed for this visit.  Subjective Assessment - 07/31/17 0907    Subjective  Patient fell saturday resulting in abrasian and hematoma on wrist and back. Was standing still after walking and fell. Reports two falls since last session.     Pertinent History  Patient referred for physical therapy due to wife's request for patient to get aquatic therapy to help with his conditioning. Pt is also complaining of chronic low back and R hip pain and frequent falls. 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 The last time he was in the water he was very uncomfortable because he didn't feel like he had any control. Pt has a history of 2 low back surgeries in August 2014 and February 2016. Pt and wife are unable to recall exactly what was performed but they know that he had stenosis. Surgeries were performed by Dr. Annette Stable in [place]. Pt reports that his low back and R hip pain are constant. He describes the pain in the back as "dull" and the pain in the hip as "sharp." For both back and hip worst pain is 10/10, Best: 2/10,  Present: 2/10. No particular episode of trauma to his low back or hip preceding the pain. Pt has a history of Parkinson's with a deep brain stimulator. He ambulates with a 4 wheeled walker called a U-step (automatic brakes, audio cues for stepping). He states that he falls frequently and reports that he has had approximately 40-50 falls in the last 6 months. No specific recent changes in his health.     Limitations  Walking;Standing;Sitting    How long can you sit comfortably?  30 minutes    How long can you stand comfortably?  5 minutes    How long can you walk comfortably?  10 minutes    Patient Stated Goals  Improve comfort in the water, improve balance, decrease back and R hip pain    Currently in Pain?  Yes    Pain Score  5     Pain Location  Back    Pain Orientation  Lower    Pain Descriptors / Indicators  Aching    Pain Type  Acute pain    Pain Onset  In the past 7 days    Pain Frequency  Constant       R IT band rollout    R piriformis rollout  bridge 5x  Supine hooklying abduction 10x GTB , 2 sets   Supine hooklying adduction 10x 5 second holds  SLR 10x each leg, challenged with knee extension, painful on R hip when raised higher than 45 degrees  TrA contraction 10x 3 second holds  TrA contraction with marches 10x     10x STS practicing feeling back of chair with both legs, trunk angle and momentum. utilization of bilateral legs equally              -excessive posterior lean     Physical Therapy Progress Note   Dates of reporting period  07/19/17  to   09/13/17 Goals addressed on 07/19/17 with patient demonstrating slight limitation of progress towards goals due to recent surgery disrupting POC. Patient's posterior sway noted with standing and transfers with patient resulting in posterior LOB.   Pt. response to medical necessity:  Patient will continue to benefit from skilled physical therapy to improve static and dynamic standing balance and decrease fall risk.                      PT Education - 07/31/17 1030    Education provided  Yes    Education Details  TrA contraction, exercise technique    Person(s) Educated  Patient    Methods  Explanation;Demonstration;Verbal cues    Comprehension  Verbalized understanding;Returned demonstration       PT Short Term Goals - 07/19/17 1741      PT SHORT TERM GOAL #1   Title  Pt will perform HEP with family's supervision, for improved balance,  transfers, and gait.      Baseline  patient reports compliance, however not every day    Time  4    Period  Weeks    Status  Partially Met      PT SHORT TERM GOAL #2   Title  Pt will perform 5x sit<>stand in less than or equal to 15 seconds for improved efficiency and safety with transfers.    Baseline  19 seconds; deffered at this time due to excessive LOB     Time  2    Period  Weeks    Status  On-going        PT Long Term Goals - 07/19/17 1742      PT LONG TERM GOAL #1   Title  Pt will decrease 5TSTS by at least 3 seconds in order to demonstrate clinically significant improvement in LE strength     Baseline  02/08/17: 20.1 seconds 12/18: 17 seconds ;    Time  8    Period  Weeks    Status  Achieved      PT LONG TERM GOAL #2   Title  Pt will decrease TUG by at least 3.4s in order to demonstrate decreased fall risk     Baseline  02/08/17: 22.1s 12/18: 20 seconds 2/7: 19 seconds; 4/3: 23 seconds with walker    Time  8    Period  Weeks    Status  Partially Met    Target Date  09/13/17      PT LONG TERM GOAL #3   Title  Pt will improve BERG by at least 3 points in order to demonstrate clinically significant improvement in balance.    Baseline  02/08/17: 30/56 12/18: 38/56      Time  8    Period  Weeks    Status  Achieved      PT LONG TERM GOAL #4   Title  Pt/wife will verbalize understanding of plans for continued community fitness upon D/C from PT including aquatic therapy program    Baseline  02/08/17: pt currently reports  being more comfortable in the water    Time  8    Period  Weeks    Status  Partially Met    Target Date  09/13/17      PT LONG TERM GOAL #5   Title  Pt will decrease mODI scoreby at least 13 points in order demonstrate clinically significant reduction in pain/disability     Baseline  02/08/17: 54% 12/18: 46% 2/7: 42% 4/3; 50%    Time  8    Period  Weeks    Status  Partially Met    Target Date  09/13/17      PT LONG TERM GOAL #6   Title  Pt will improve BERG to 44/56  in order to demonstrate clinically significant improvement in balance for decreased fall risk    Baseline  12/18: 38/56 2/7: 41/56 4/3: 41/56    Time  8    Period  Weeks    Status  Partially Met    Target Date  09/13/17            Plan - 07/31/17 1250    Clinical Impression Statement  Patient presents with back pain from fall over the weekend. Patient requires max cueing for task sequencing with regression to compensatory patterning occurring when patient is distracted. TrA activation improving with decreased need for tactile cueing. Patient will continue to benefit from skilled physical therapy  to improve static and dynamic standing balance and decrease fall risk.     Rehab Potential  Fair    PT Frequency  2x / week    PT Duration  8 weeks    PT Treatment/Interventions  ADLs/Self Care Home Management;Aquatic Therapy;Cryotherapy;Electrical Stimulation;Iontophoresis '4mg'$ /ml Dexamethasone;Moist Heat;Ultrasound;Contrast Bath;DME Instruction;Gait training;Stair training;Functional mobility training;Therapeutic activities;Therapeutic exercise;Balance training;Neuromuscular re-education;Patient/family education;Manual techniques;Wheelchair mobility training;Energy conservation;Passive range of motion    PT Next Visit Plan  progress balance, strength, aquatic therapy    PT Home Exercise Plan  None provided at this time until further assessed, wife reports pt does not perform current HEP from [facility]    Consulted and Agree with  Plan of Care  Patient;Family member/caregiver    Family Member Consulted  wife       Patient will benefit from skilled therapeutic intervention in order to improve the following deficits and impairments:  Abnormal gait, Decreased balance, Decreased coordination, Decreased mobility, Impaired tone, Postural dysfunction  Visit Diagnosis: Muscle weakness (generalized)  Repeated falls  Other abnormalities of gait and mobility  Unspecified lack of coordination     Problem List Patient Active Problem List   Diagnosis Date Noted  . 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 Radicular pain in right arm 10/09/2016  . Rash 09/03/2016  . GERD (gastroesophageal reflux disease) 07/31/2016  . Trochanteric bursitis 03/03/2016  . Left hand pain 10/30/2015  . Fracture, finger, multiple sites 10/30/2015  . Colon cancer screening 07/23/2015  . Healthcare maintenance 07/23/2015  . Gout 02/18/2015  . Depression 01/06/2015  . Irritation of eyelid 08/20/2014  . Dupuytren's contracture 08/20/2014  . Cough 07/21/2014  . Lumbar stenosis with neurogenic claudication 06/13/2014  . Preop cardiovascular exam 05/28/2014  . S/P deep brain stimulator placement 05/08/2014  . Joint pain 01/22/2014  . Medicare annual wellness visit, initial 01/19/2014  . Advance care planning 01/19/2014  . Parkinson's disease ([place]) 12/13/2013  . PTSD (post-traumatic stress disorder) 06/13/2013  . Erectile dysfunction 06/13/2013  . HLD (hyperlipidemia) 06/13/2013  . Hip pain 06/10/2013  . Pain in joint, shoulder region 06/10/2013  . Right leg swelling 06/03/2013  . Essential hypertension 06/03/2013  . Bradycardia by electrocardiogram 06/03/2013  . Obstructive sleep apnea 03/12/2013  . Spinal stenosis, lumbar region, with neurogenic claudication 12/14/2012  Janna Arch, PT, DPT   07/31/2017, 12:51 PM  [place] MAIN [facility] SERVICES [address] [place], [place], [address] Phone:  [phone]   Fax:  [phone]  Name: [name] MRN: [id number] Date of Birth: June 17, 1950

## 2017-07-31 NOTE — Therapy (Signed)
[place] MAIN [facility] SERVICES [address] [place], [place], [address] Phone: [phone]   Fax:  [phone]  Occupational Therapy Evaluation  Patient Details  Name: [name] MRN: [id number] Date of Birth: 17-Feb-1951 Referring Provider: Wells Guiles Tat   Encounter Date: 07/31/2017  OT End of Session - 07/31/17 1117    Visit Number  1    Number of Visits  24    Date for OT Re-Evaluation  10/23/17    Authorization Type  Visit 1 of 10 for progress reporting period starting 10/23/2017    OT Start Time  0915    OT Stop Time  1015    OT Time Calculation (min)  60 min    Activity Tolerance  Patient tolerated treatment well    Behavior During Therapy  [facility] for tasks assessed/performed       Past Medical History:  Diagnosis Date  . Arthritis   . Bradycardia   . Cancer [facility]) 2013   skin cancer  . Depression    ptsd  . Dysrhythmia    chronic slow heart rate  . GERD (gastroesophageal reflux disease)   . Headache(784.0)    tension headaches non recent  . History of chicken pox   . History of kidney stones    passed  . Hypertension    treated with HCTZ  . Parkinson's disease ([place])    dx'ed 15 years ago  . PTSD (post-traumatic stress disorder)   . Shortness of breath dyspnea   . Sleep apnea    doesn't use C-pap  . Varicose veins     Past Surgical History:  Procedure Laterality Date  . CHOLECYSTECTOMY N/A 10/22/2014   Procedure: LAPAROSCOPIC CHOLECYSTECTOMY WITH INTRAOPERATIVE CHOLANGIOGRAM;  Surgeon: Dia Crawford III, MD;  Location: [facility] ORS;  Service: General;  Laterality: N/A;  . cyst removed      from lip as a child  . LUMBAR LAMINECTOMY/DECOMPRESSION MICRODISCECTOMY Bilateral 12/14/2012   Procedure: Bilateral lumbar three-four, four-five decompressive laminotomy/foraminotomy;  Surgeon: Charlie Pitter, MD;  Location: [place] NEURO ORS;  Service: Neurosurgery;  Laterality: Bilateral;  . PULSE GENERATOR IMPLANT Bilateral 12/13/2013    Procedure: Bilateral implantable pulse generator placement;  Surgeon: Erline Levine, MD;  Location: [place] NEURO ORS;  Service: Neurosurgery;  Laterality: Bilateral;  Bilateral implantable pulse generator placement  . skin cancer removed     from ears,   12 lft arm  rt leg 15  . SUBTHALAMIC STIMULATOR BATTERY REPLACEMENT Bilateral 07/14/2017   Procedure: BILATERAL IMPLANTED PULSE GENERATOR CHANGE FOR DEEP BRAIN STIMULATOR;  Surgeon: Erline Levine, MD;  Location: [place];  Service: Neurosurgery;  Laterality: Bilateral;  . SUBTHALAMIC STIMULATOR INSERTION Bilateral 12/06/2013   Procedure: SUBTHALAMIC STIMULATOR INSERTION;  Surgeon: Erline Levine, MD;  Location: [place] NEURO ORS;  Service: Neurosurgery;  Laterality: Bilateral;  Bilateral deep brain stimulator placement    There were no vitals filed for this visit.  Subjective Assessment - 07/31/17 1107    Subjective   Pt. was present with his wife for initial part of the the evaluation.    Patient is accompained by:  Family member    Pertinent History  Pt. is a 67 y.o. mlae who has a history of Parkinson's Disease. Pt. has a Deep Brain Stimulator in place. Pt. has a history of Low Back Pain with 2 back surgeries in August 2014, and February 2016. Pt. has a history of multiple frequent falls occurring at a rate of 6x's a week.  Limitations  Frequent falls, limited motor control, and [place].     Patient Stated Goals  To return home.    Currently in Pain?  No/denies        [facility] OT Assessment - 07/31/17 5277      Assessment   Medical Diagnosis  Parkinson's Disease    Referring Provider  Wells Guiles Tat    Onset Date/Surgical Date  01/16/17    Hand Dominance  Right    Prior Therapy  Yes      Precautions   Precautions  Fall      Restrictions   Weight Bearing Restrictions  No      Balance Screen   Has the patient fallen in the past 6 months  Yes    How many times?  -- 6 falls a week    Has the patient had a decrease in activity level because of a fear of  falling?   Yes    Is the patient reluctant to leave their home because of a fear of falling?   No      Home  Environment   Family/patient expects to be discharged to:  Private residence    Living Arrangements  Spouse/significant other    [place]  One level    Alternate Level Stairs - Number of Steps  3 Ramp out back    Bathroom Shower/Tub  Tub/Shower unit    [place] seat;Walker - 2 wheels;Cane - single point;Wheelchair - manual;Wheelchair - power;Grab bars - tub/shower;Hand held shower head    Lives With  Spouse      Prior Function   Level of Independence  Independent    Vocation  Retired    Economist, Haematologist, and working on cars      ADL   Eating/Feeding  Minimal assistance Increased spillage    Grooming  Independent Increased time for shaving, Wife assists with shaving.    Upper Body Bathing  Independent    Lower Body Bathing  Modified independent    Upper Body Dressing  Increased time Assist with cuff buttons.    Lower Body Dressing  Moderate assistance    Banker -  Control and instrumentation engineer  Supervision/safety      IADL   Shopping  Needs to be accompanied on any shopping trip    Light Housekeeping  Needs help with all home maintenance tasks    Meal Prep  Able to complete simple cold meal and snack prep    Medication Management  Is not capable of dispensing or managing own medication    Financial Management  Requires assistance      Written Expression   Dominant Hand  Right      Vision - History   Baseline Vision  Wears glasses all the time    Patient Visual Report  -- Bluriness      Activity Tolerance   Activity Tolerance  Tolerates < 10 min activity with changes in vital signs      Cognition   Overall Cognitive Status  Impaired/Different from baseline    Area of Impairment  Memory;Safety/judgement;Problem solving    Memory   Impaired    Problem Solving  Impaired    Cognition Comments  Pt. reports ne notices that he is now only able to problem solve through the "simple stuff"  Sensation   Light Touch  Appears Intact    Stereognosis  Appears Intact      Coordination   Gross Motor Movements are Fluid and Coordinated  No    Fine Motor Movements are Fluid and Coordinated  No    Right 9 Hole Peg Test  1 min., 5 sec.    Left 9 Hole Peg Test  35 sec.      Strength   Overall Strength Comments  BUE strength: 4+/5 overall      Hand Function   Right Hand Grip (lbs)  67    Right Hand Lateral Pinch  21 lbs    Right Hand 3 Point Pinch  16 lbs    Left Hand Grip (lbs)  60    Left Hand Lateral Pinch  21 lbs    Left 3 point pinch  15 lbs       OT TREATMENT    Therapeutic Exercise:  Pt. Performed hand strengthening with green theraputty. Pt. required cues for proper technique. Pt. worked on gross grip loop, lateral pinch, 3pt. pinch, gross digit extension, digit extension table spread, digit abduction loop, single digit extension loop, thumb opposition, and lumbical ex,  Pt. required verbal and tactile cues for hand movement, and putty position. Pt. Was provided with a visual handout.                       OT Long Term Goals - 07/31/17 1122      OT LONG TERM GOAL #1   Title  Pt. will improve Bilateral UE strength by 1 mm grade to assist with ADLs, and IADLs    Baseline  EVal: BUE strength: 4+/5 overall    Time  12    Period  Weeks    Status  On-going    Target Date  10/23/17      OT LONG TERM GOAL #2   Title  Pt. will improve grip strength to be able to pour himself a beverage.    Baseline  Eval: Pt. is unable to hold and pour a beevrage.    Time  12    Period  Weeks    Status  New    Target Date  10/23/17      OT LONG TERM GOAL #3   Title  Pt. will improve lateral pinch stength to be able to independently turn a key.     Baseline  Eval: Pt. has difficulty holding, and turning   key.    Time  12    Period  Weeks    Status  New    Target Date  10/23/17      OT LONG TERM GOAL #4   Title  Pt. will improve Right hand [facility] skills to be able to independently grasp and use small objects needed for fixing car parts.    Baseline  Eval: Right hand 1 min. & 5 sec. on the 9 hole peg test.    Time  12    Period  Weeks    Status  New    Target Date  10/23/17      OT LONG TERM GOAL #5   Title  Pt. will be demonstarte self-feeding with modified independence with minimal spillage.    Baseline  Eval: Pt. requires assistance, and has alot of spillage.    Time  12    Period  Weeks    Status  On-going    Target Date  10/23/17  Long Term Additional Goals   Additional Long Term Goals  --      OT LONG TERM GOAL #6   Title  Pt. will manipulate buttons, and cuff buttons independently.    Baseline  Eval: Pt. has difficulty     Time  12    Target Date  10/23/17      OT LONG TERM GOAL #7   Title  Pt. will write 1 sentence efficiently with 90% legibility.    Baseline  Eval: mname only 50% legibility    Time  12    Period  Weeks    Status  New    Target Date  10/23/17            Plan - 07/31/17 1137    Clinical Impression Statement  Pt. is a 67 y.o. male with a history of Parkinson's disease. Pt. has a Deep Brain Stimulator in place, and history of frequent multiple falls which puts pt. at a high risk for falls. Pt. presents with impaired balance, strength, motor control, and coordination which limit his ability to complete basic ADL, and IADL tasks including: self-feeding, manipulating buttons, picking up, and handling small objects/hardware needed for fixing things., and writing/typing. Pt. sum score on the MAM-20 is 61/80. Pt. will benefit from skilled OT services to improve [facility] skills for improved functional hand use during ADLs, and IADLs.    Occupational performance deficits (Please refer to evaluation for details):  ADL's;IADL's    Rehab Potential  Good     Current Impairments/barriers affecting progress:  Positive Indicators: age, familiy support, motivation. Negative Indicators: Multiple comorbidities, and history of falls.    OT Frequency  2x / week    OT Duration  12 weeks    OT Treatment/Interventions  Self-care/ADL training;Therapeutic exercise;DME and/or AE instruction;Energy conservation;Neuromuscular education;Patient/family education;Therapeutic activities    Clinical Decision Making  Several treatment options, min-mod task modification necessary       Patient will benefit from skilled therapeutic intervention in order to improve the following deficits and impairments:  Pain, Decreased strength, Decreased coordination, Decreased balance, Impaired UE functional use, Decreased safety awareness, Decreased activity tolerance, Decreased cognition, Impaired flexibility, Impaired vision/preception  Visit Diagnosis: Muscle weakness (generalized)  Other lack of coordination    Problem List Patient Active Problem List   Diagnosis Date Noted  . 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 Radicular pain in right arm 10/09/2016  . Rash 09/03/2016  . GERD (gastroesophageal reflux disease) 07/31/2016  . Trochanteric bursitis 03/03/2016  . Left hand pain 10/30/2015  . Fracture, finger, multiple sites 10/30/2015  . Colon cancer screening 07/23/2015  . Healthcare maintenance 07/23/2015  . Gout 02/18/2015  . Depression 01/06/2015  . Irritation of eyelid 08/20/2014  . Dupuytren's contracture 08/20/2014  . Cough 07/21/2014  . Lumbar stenosis with neurogenic claudication 06/13/2014  . Preop cardiovascular exam 05/28/2014  . S/P deep brain stimulator placement 05/08/2014  . Joint pain 01/22/2014  . Medicare annual wellness visit, initial 01/19/2014  . Advance care planning 01/19/2014  . Parkinson's disease ([place]) 12/13/2013  . PTSD (post-traumatic stress disorder) 06/13/2013  . Erectile dysfunction 06/13/2013  . HLD (hyperlipidemia) 06/13/2013   . Hip pain 06/10/2013  . Pain in joint, shoulder region 06/10/2013  . Right leg swelling 06/03/2013  . Essential hypertension 06/03/2013  . Bradycardia by electrocardiogram 06/03/2013  . Obstructive sleep apnea 03/12/2013  . Spinal stenosis, lumbar region, with neurogenic claudication 12/14/2012    Harrel Carina, MS, OTR/L 07/31/2017, 11:52 AM  [place]  [facility] MAIN [facility] SERVICES [place], [place], [address] Phone: [phone]   Fax:  [phone]  Name: [name] MRN: [id number] Date of Birth: 08-15-1950

## 2017-08-02 ENCOUNTER — Ambulatory Visit: Payer: Medicare PPO | Admitting: Occupational Therapy

## 2017-08-02 ENCOUNTER — Encounter: Payer: Self-pay | Admitting: Occupational Therapy

## 2017-08-02 ENCOUNTER — Other Ambulatory Visit: Payer: Self-pay

## 2017-08-02 DIAGNOSIS — R279 Unspecified lack of coordination: Secondary | ICD-10-CM | POA: Diagnosis not present

## 2017-08-02 DIAGNOSIS — G4733 Obstructive sleep apnea (adult) (pediatric): Secondary | ICD-10-CM | POA: Diagnosis not present

## 2017-08-02 DIAGNOSIS — R2689 Other abnormalities of gait and mobility: Secondary | ICD-10-CM | POA: Diagnosis not present

## 2017-08-02 DIAGNOSIS — R278 Other lack of coordination: Secondary | ICD-10-CM | POA: Diagnosis not present

## 2017-08-02 DIAGNOSIS — M6281 Muscle weakness (generalized): Secondary | ICD-10-CM

## 2017-08-02 DIAGNOSIS — R2681 Unsteadiness on feet: Secondary | ICD-10-CM | POA: Diagnosis not present

## 2017-08-02 DIAGNOSIS — G20 Parkinson's disease: Secondary | ICD-10-CM | POA: Diagnosis not present

## 2017-08-02 DIAGNOSIS — R296 Repeated falls: Secondary | ICD-10-CM | POA: Diagnosis not present

## 2017-08-02 NOTE — Therapy (Signed)
[place] MAIN [facility] SERVICES [address] [place], [place], [address] Phone: [phone]   Fax:  [phone]  Occupational Therapy Treatment  Patient Details  Name: [name] MRN: [id number] Date of Birth: 01-Jul-1950 Referring Provider: Wells Guiles Tat   Encounter Date: 08/02/2017  OT End of Session - 08/02/17 0901    Visit Number  2    Number of Visits  24    Date for OT Re-Evaluation  10/23/17    Authorization Type  Visit 2 of 10 for progress reporting period starting 10/23/2017    OT Start Time  0845    OT Stop Time  0930    OT Time Calculation (min)  45 min    Activity Tolerance  Patient tolerated treatment well    Behavior During Therapy  [facility] for tasks assessed/performed       Past Medical History:  Diagnosis Date  . Arthritis   . Bradycardia   . Cancer [facility]) 2013   skin cancer  . Depression    ptsd  . Dysrhythmia    chronic slow heart rate  . GERD (gastroesophageal reflux disease)   . Headache(784.0)    tension headaches non recent  . History of chicken pox   . History of kidney stones    passed  . Hypertension    treated with HCTZ  . Parkinson's disease ([place])    dx'ed 15 years ago  . PTSD (post-traumatic stress disorder)   . Shortness of breath dyspnea   . Sleep apnea    doesn't use C-pap  . Varicose veins     Past Surgical History:  Procedure Laterality Date  . CHOLECYSTECTOMY N/A 10/22/2014   Procedure: LAPAROSCOPIC CHOLECYSTECTOMY WITH INTRAOPERATIVE CHOLANGIOGRAM;  Surgeon: Dia Crawford III, MD;  Location: [facility] ORS;  Service: General;  Laterality: N/A;  . cyst removed      from lip as a child  . LUMBAR LAMINECTOMY/DECOMPRESSION MICRODISCECTOMY Bilateral 12/14/2012   Procedure: Bilateral lumbar three-four, four-five decompressive laminotomy/foraminotomy;  Surgeon: Charlie Pitter, MD;  Location: [place] NEURO ORS;  Service: Neurosurgery;  Laterality: Bilateral;  . PULSE GENERATOR IMPLANT Bilateral 12/13/2013   Procedure:  Bilateral implantable pulse generator placement;  Surgeon: Erline Levine, MD;  Location: [place] NEURO ORS;  Service: Neurosurgery;  Laterality: Bilateral;  Bilateral implantable pulse generator placement  . skin cancer removed     from ears,   12 lft arm  rt leg 15  . SUBTHALAMIC STIMULATOR BATTERY REPLACEMENT Bilateral 07/14/2017   Procedure: BILATERAL IMPLANTED PULSE GENERATOR CHANGE FOR DEEP BRAIN STIMULATOR;  Surgeon: Erline Levine, MD;  Location: [place];  Service: Neurosurgery;  Laterality: Bilateral;  . SUBTHALAMIC STIMULATOR INSERTION Bilateral 12/06/2013   Procedure: SUBTHALAMIC STIMULATOR INSERTION;  Surgeon: Erline Levine, MD;  Location: [place] NEURO ORS;  Service: Neurosurgery;  Laterality: Bilateral;  Bilateral deep brain stimulator placement    There were no vitals filed for this visit.  Subjective Assessment - 08/02/17 0851    Subjective   Pt. reports he used the putty at home.    Patient is accompained by:  Family member    Pertinent History  Pt. is a 67 y.o. mlae who has a history of Parkinson's Disease. Pt. has a Deep Brain Stimulator in place. Pt. has a history of Low Back Pain with 2 back surgeries in August 2014, and February 2016. Pt. has a history of multiple frequent falls occurring at a rate of 6x's a week.     Limitations  Frequent falls, limited  motor control, and [place].     Currently in Pain?  Yes    Pain Score  6     Pain Location  Back    Pain Orientation  Mid    Pain Descriptors / Indicators  Aching       OT TREATMENT    Neuro muscular re-education:  Pt. performed [facility] skills training to improve speed and dexterity needed for ADL tasks and writing. Pt. demonstrated grasping 1 inch sticks,  inch cylindrical collars, and  inch flat washers on the [place] pegboard. Pt. performed grasping each item with his 2nd digit and thumb. Pt. was unable to attempt to store the objects in his hand. Pt. Had difficulty grasping the objects. Pt. Worked on grasping, and storing 1/2" beads, and  moving them to the tip of his 2nd digit, and thumb.  Therapeutic Exercise:  Pt. performed gross gripping with grip strengthener. Pt. worked on sustaining grip while grasping pegs and reaching at various heights. Gripper was placed in the 3rd resistive slot with the white resistive spring. Pt. Worked on pinch strengthening in the left hand for lateral, and 3pt. pinch using yellow, red, green, blue, and black resistive clips. Pt. worked on placing the clips at various vertical and horizontal angles. Tactile and verbal cues were required for eliciting the desired movement.                              OT Long Term Goals - 07/31/17 1122      OT LONG TERM GOAL #1   Title  Pt. will improve Bilateral UE strength by 1 mm grade to assist with ADLs, and IADLs    Baseline  EVal: BUE strength: 4+/5 overall    Time  12    Period  Weeks    Status  On-going    Target Date  10/23/17      OT LONG TERM GOAL #2   Title  Pt. will improve grip strength to be able to pour himself a beverage.    Baseline  Eval: Pt. is unable to hold and pour a beevrage.    Time  12    Period  Weeks    Status  New    Target Date  10/23/17      OT LONG TERM GOAL #3   Title  Pt. will improve lateral pinch stength to be able to independently turn a key.     Baseline  Eval: Pt. has difficulty holding, and turning  key.    Time  12    Period  Weeks    Status  New    Target Date  10/23/17      OT LONG TERM GOAL #4   Title  Pt. will improve Right hand [facility] skills to be able to independently grasp and use small objects needed for fixing car parts.    Baseline  Eval: Right hand 1 min. & 5 sec. on the 9 hole peg test.    Time  12    Period  Weeks    Status  New    Target Date  10/23/17      OT LONG TERM GOAL #5   Title  Pt. will be demonstarte self-feeding with modified independence with minimal spillage.    Baseline  Eval: Pt. requires assistance, and has alot of spillage.    Time  12    Period   Weeks    Status  On-going    Target Date  10/23/17      Long Term Additional Goals   Additional Long Term Goals  --      OT LONG TERM GOAL #6   Title  Pt. will manipulate buttons, and cuff buttons independently.    Baseline  Eval: Pt. has difficulty     Time  12    Target Date  10/23/17      OT LONG TERM GOAL #7   Title  Pt. will write 1 sentence efficiently with 90% legibility.    Baseline  Eval: mname only 50% legibility    Time  12    Period  Weeks    Status  New    Target Date  10/23/17            Plan - 08/02/17 0901    Clinical Impression Statement  Pt. reports he did the theraputty exercises at home yesterday. Pt. reports the HEP is going well.  Pt. continues to work on improving the development of [facility] skills, hand function, and translatory movements of the hand. Pt. has difficulty grasping, and storing the objects in his hand.     Occupational performance deficits (Please refer to evaluation for details):  ADL's;IADL's    Rehab Potential  Good    Current Impairments/barriers affecting progress:  Positive Indicators: age, familiy support, motivation. Negative Indicators: Multiple comorbidities, and history of falls.    OT Frequency  2x / week    OT Duration  12 weeks    OT Treatment/Interventions  Self-care/ADL training;Therapeutic exercise;DME and/or AE instruction;Energy conservation;Neuromuscular education;Patient/family education;Therapeutic activities    Clinical Decision Making  Several treatment options, min-mod task modification necessary    OT Home Exercise Plan  HEP with  Green Theraputty    Consulted and Agree with Plan of Care  Patient       Patient will benefit from skilled therapeutic intervention in order to improve the following deficits and impairments:  Pain, Decreased strength, Decreased coordination, Decreased balance, Impaired UE functional use, Decreased safety awareness, Decreased activity tolerance, Decreased cognition, Impaired flexibility,  Impaired vision/preception  Visit Diagnosis: Muscle weakness (generalized)  Other lack of coordination    Problem List Patient Active Problem List   Diagnosis Date Noted  . 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 Radicular pain in right arm 10/09/2016  . Rash 09/03/2016  . GERD (gastroesophageal reflux disease) 07/31/2016  . Trochanteric bursitis 03/03/2016  . Left hand pain 10/30/2015  . Fracture, finger, multiple sites 10/30/2015  . Colon cancer screening 07/23/2015  . Healthcare maintenance 07/23/2015  . Gout 02/18/2015  . Depression 01/06/2015  . Irritation of eyelid 08/20/2014  . Dupuytren's contracture 08/20/2014  . Cough 07/21/2014  . Lumbar stenosis with neurogenic claudication 06/13/2014  . Preop cardiovascular exam 05/28/2014  . S/P deep brain stimulator placement 05/08/2014  . Joint pain 01/22/2014  . Medicare annual wellness visit, initial 01/19/2014  . Advance care planning 01/19/2014  . Parkinson's disease ([place]) 12/13/2013  . PTSD (post-traumatic stress disorder) 06/13/2013  . Erectile dysfunction 06/13/2013  . HLD (hyperlipidemia) 06/13/2013  . Hip pain 06/10/2013  . Pain in joint, shoulder region 06/10/2013  . Right leg swelling 06/03/2013  . Essential hypertension 06/03/2013  . Bradycardia by electrocardiogram 06/03/2013  . Obstructive sleep apnea 03/12/2013  . Spinal stenosis, lumbar region, with neurogenic claudication 12/14/2012    Harrel Carina 08/02/2017, 9:13 AM  [place], [place], [address] Phone: [phone]  Fax:  [phone]  Name: [name] MRN: [id number] Date of Birth: 27-Nov-1950

## 2017-08-05 ENCOUNTER — Other Ambulatory Visit: Payer: Self-pay | Admitting: Family Medicine

## 2017-08-07 ENCOUNTER — Ambulatory Visit: Payer: Medicare PPO | Admitting: Occupational Therapy

## 2017-08-07 ENCOUNTER — Ambulatory Visit: Payer: Medicare PPO

## 2017-08-07 ENCOUNTER — Encounter: Payer: Self-pay | Admitting: Occupational Therapy

## 2017-08-07 DIAGNOSIS — M6281 Muscle weakness (generalized): Secondary | ICD-10-CM

## 2017-08-07 DIAGNOSIS — R2689 Other abnormalities of gait and mobility: Secondary | ICD-10-CM | POA: Diagnosis not present

## 2017-08-07 DIAGNOSIS — R296 Repeated falls: Secondary | ICD-10-CM

## 2017-08-07 DIAGNOSIS — R278 Other lack of coordination: Secondary | ICD-10-CM

## 2017-08-07 DIAGNOSIS — R2681 Unsteadiness on feet: Secondary | ICD-10-CM

## 2017-08-07 DIAGNOSIS — R279 Unspecified lack of coordination: Secondary | ICD-10-CM | POA: Diagnosis not present

## 2017-08-07 NOTE — Therapy (Signed)
[place] MAIN [facility] SERVICES [address] [place], [place], [address] Phone: [phone]   Fax:  [phone]  Physical Therapy Treatment  Patient Details  Name: [name] MRN: [id number] Date of Birth: [birth year] Referring Provider: Wells Guiles Tat   Encounter Date: 08/07/2017  PT End of Session - 08/07/17 0939    Visit Number  32    Number of Visits  49    Date for PT Re-Evaluation  09/13/17    Authorization Type  4/10 of progress note    PT Start Time  0932    PT Stop Time  1018    PT Time Calculation (min)  46 min    Equipment Utilized During Treatment  Gait belt    Activity Tolerance  Patient tolerated treatment well;Treatment limited secondary to medical complications (Comment)    Behavior During Therapy  [facility] for tasks assessed/performed       Past Medical History:  Diagnosis Date  . Arthritis   . Bradycardia   . Cancer [facility]) 2013   skin cancer  . Depression    ptsd  . Dysrhythmia    chronic slow heart rate  . GERD (gastroesophageal reflux disease)   . Headache(784.0)    tension headaches non recent  . History of chicken pox   . History of kidney stones    passed  . Hypertension    treated with HCTZ  . Parkinson's disease ([place])    dx'ed 15 years ago  . PTSD (post-traumatic stress disorder)   . Shortness of breath dyspnea   . Sleep apnea    doesn't use C-pap  . Varicose veins     Past Surgical History:  Procedure Laterality Date  . CHOLECYSTECTOMY N/A 10/22/2014   Procedure: LAPAROSCOPIC CHOLECYSTECTOMY WITH INTRAOPERATIVE CHOLANGIOGRAM;  Surgeon: Dia Crawford III, MD;  Location: [facility] ORS;  Service: General;  Laterality: N/A;  . cyst removed      from lip as a child  . LUMBAR LAMINECTOMY/DECOMPRESSION MICRODISCECTOMY Bilateral 12/14/2012   Procedure: Bilateral lumbar three-four, four-five decompressive laminotomy/foraminotomy;  Surgeon: Charlie Pitter, MD;  Location: [place] NEURO ORS;  Service: Neurosurgery;  Laterality:  Bilateral;  . PULSE GENERATOR IMPLANT Bilateral 12/13/2013   Procedure: Bilateral implantable pulse generator placement;  Surgeon: Erline Levine, MD;  Location: [place] NEURO ORS;  Service: Neurosurgery;  Laterality: Bilateral;  Bilateral implantable pulse generator placement  . skin cancer removed     from ears,   12 lft arm  rt leg 15  . SUBTHALAMIC STIMULATOR BATTERY REPLACEMENT Bilateral 07/14/2017   Procedure: BILATERAL IMPLANTED PULSE GENERATOR CHANGE FOR DEEP BRAIN STIMULATOR;  Surgeon: Erline Levine, MD;  Location: [place];  Service: Neurosurgery;  Laterality: Bilateral;  . SUBTHALAMIC STIMULATOR INSERTION Bilateral 12/06/2013   Procedure: SUBTHALAMIC STIMULATOR INSERTION;  Surgeon: Erline Levine, MD;  Location: [place] NEURO ORS;  Service: Neurosurgery;  Laterality: Bilateral;  Bilateral deep brain stimulator placement    There were no vitals filed for this visit.  Subjective Assessment - 08/07/17 0938    Subjective  Patient reports falling yesterday when trying to pick something up from the floor. Continues to have shoulder pain.     Pertinent History  Patient referred for physical therapy due to wife's request for patient to get aquatic therapy to help with his conditioning. Pt is also complaining of chronic low back and R hip pain and frequent falls. 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  The last time he was in the water he was very uncomfortable because he didn't feel like he had any control. Pt has a history of 2 low back surgeries in August 2014 and February 2016. Pt and wife are unable to recall exactly what was performed but they know that he had stenosis. Surgeries were performed by Dr. Annette Stable in [place]. Pt reports that his low back and R hip pain are constant. He describes the pain in the back as "dull" and the pain in the hip as "sharp." For both back and hip worst pain is 10/10, Best: 2/10, Present: 2/10. No particular episode of  trauma to his low back or hip preceding the pain. Pt has a history of Parkinson's with a deep brain stimulator. He ambulates with a 4 wheeled walker called a U-step (automatic brakes, audio cues for stepping). He states that he falls frequently and reports that he has had approximately 40-50 falls in the last 6 months. No specific recent changes in his health.     Limitations  Walking;Standing;Sitting    How long can you sit comfortably?  30 minutes    How long can you stand comfortably?  5 minutes    How long can you walk comfortably?  10 minutes    Patient Stated Goals  Improve comfort in the water, improve balance, decrease back and R hip pain    Currently in Pain?  Yes    Pain Score  5     Pain Location  Shoulder    Pain Orientation  Right    Pain Descriptors / Indicators  Aching    Pain Type  Acute pain    Pain Onset  In the past 7 days    Pain Frequency  Constant          Nustep Lvl 4 4 minutes   SPT from WC to Nustep required 3 attempts with PT requiring patient to stop count out loud to 5 before retrying to to patient becoming agitated and beginning to flail.     IT band roller seated position 2 minutes  #5 lb ankle weights seated: cues for not posteriorly leaning.              Marching 2 15x, cues for edge of seat with arms crossed              LAQ 2x 12 with 3 second holds  with cues for keeping back not against chair for core and posture stability    Seated abduction GTB 2x 10x with feet together and hold 3 seconds prior to returning to starting position   Resisted hamstring curl GTB 2x15 each leg     6" step toe taps 20x each leg: cues for where to place foot and frequency of taps with verbalization of 1 and 2 for timing.   6" side toe taps 20x each leg.   Standing weighted ball flexion 10x, cross body D2 pattern 10x each direction to challenge trunk stability, 10x vertically     Walking in // bars with cues for speed of steps with PT calling 1,2,3,4 : improved  gait mechanics and heel strike.   Side stepping in // bars 4x length without UE support no LOB  Side step on Airex balance beam in // bars 8x length of // bars with finger tip support.       Pt. response to medical necessity:   Patient will continue to benefit from skilled physical therapy to improve static and  dynamic standing balance and decrease fall risk for improved functional capacity of activities                      PT Education - 08/07/17 0939    Education provided  Yes    Education Details  exercise technique     Person(s) Educated  Patient    Methods  Explanation;Demonstration;Verbal cues    Comprehension  Verbalized understanding;Returned demonstration       PT Short Term Goals - 07/19/17 1741      PT SHORT TERM GOAL #1   Title  Pt will perform HEP with family's supervision, for improved balance, transfers, and gait.      Baseline  patient reports compliance, however not every day    Time  4    Period  Weeks    Status  Partially Met      PT SHORT TERM GOAL #2   Title  Pt will perform 5x sit<>stand in less than or equal to 15 seconds for improved efficiency and safety with transfers.    Baseline  19 seconds; deffered at this time due to excessive LOB     Time  2    Period  Weeks    Status  On-going        PT Long Term Goals - 07/19/17 1742      PT LONG TERM GOAL #1   Title  Pt will decrease 5TSTS by at least 3 seconds in order to demonstrate clinically significant improvement in LE strength     Baseline  02/08/17: 20.1 seconds 12/18: 17 seconds ;    Time  8    Period  Weeks    Status  Achieved      PT LONG TERM GOAL #2   Title  Pt will decrease TUG by at least 3.4s in order to demonstrate decreased fall risk     Baseline  02/08/17: 22.1s 12/18: 20 seconds 2/7: 19 seconds; 4/3: 23 seconds with walker    Time  8    Period  Weeks    Status  Partially Met    Target Date  09/13/17      PT LONG TERM GOAL #3   Title  Pt will improve  BERG by at least 3 points in order to demonstrate clinically significant improvement in balance.    Baseline  02/08/17: 30/56 12/18: 38/56      Time  8    Period  Weeks    Status  Achieved      PT LONG TERM GOAL #4   Title  Pt/wife will verbalize understanding of plans for continued community fitness upon D/C from PT including aquatic therapy program    Baseline  02/08/17: pt currently reports being more comfortable in the water    Time  8    Period  Weeks    Status  Partially Met    Target Date  09/13/17      PT LONG TERM GOAL #5   Title  Pt will decrease mODI scoreby at least 13 points in order demonstrate clinically significant reduction in pain/disability     Baseline  02/08/17: 54% 12/18: 46% 2/7: 42% 4/3; 50%    Time  8    Period  Weeks    Status  Partially Met    Target Date  09/13/17      PT LONG TERM GOAL #6   Title  Pt will improve BERG to 44/56  in order to  demonstrate clinically significant improvement in balance for decreased fall risk    Baseline  12/18: 38/56 2/7: 41/56 4/3: 41/56    Time  8    Period  Weeks    Status  Partially Met    Target Date  09/13/17            Plan - 08/07/17 1002    Clinical Impression Statement  Patient presents with posterior LOB with fatigue. Requires verbal cueing for tempo of interventions. When patient fatigues verbalization becomes softer and more confusion.  Patient will continue to benefit from skilled physical therapy to improve static and dynamic standing balance and decrease fall risk for improved functional capacity of activities    Rehab Potential  Fair    PT Frequency  2x / week    PT Duration  8 weeks    PT Treatment/Interventions  ADLs/Self Care Home Management;Aquatic Therapy;Cryotherapy;Electrical Stimulation;Iontophoresis '4mg'$ /ml Dexamethasone;Moist Heat;Ultrasound;Contrast Bath;DME Instruction;Gait training;Stair training;Functional mobility training;Therapeutic activities;Therapeutic exercise;Balance  training;Neuromuscular re-education;Patient/family education;Manual techniques;Wheelchair mobility training;Energy conservation;Passive range of motion    PT Next Visit Plan  progress balance, strength, aquatic therapy    PT Home Exercise Plan  None provided at this time until further assessed, wife reports pt does not perform current HEP from [facility] PT    Consulted and Agree with Plan of Care  Patient;Family member/caregiver    Family Member Consulted  wife       Patient will benefit from skilled therapeutic intervention in order to improve the following deficits and impairments:  Abnormal gait, Decreased balance, Decreased coordination, Decreased mobility, Impaired tone, Postural dysfunction  Visit Diagnosis: Muscle weakness (generalized)  Other lack of coordination  Repeated falls  Other abnormalities of gait and mobility  Unsteadiness on feet     Problem List Patient Active Problem List   Diagnosis Date Noted  . 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 Radicular pain in right arm 10/09/2016  . Rash 09/03/2016  . GERD (gastroesophageal reflux disease) 07/31/2016  . Trochanteric bursitis 03/03/2016  . Left hand pain 10/30/2015  . Fracture, finger, multiple sites 10/30/2015  . Colon cancer screening 07/23/2015  . Healthcare maintenance 07/23/2015  . Gout 02/18/2015  . Depression 01/06/2015  . Irritation of eyelid 08/20/2014  . Dupuytren's contracture 08/20/2014  . Cough 07/21/2014  . Lumbar stenosis with neurogenic claudication 06/13/2014  . Preop cardiovascular exam 05/28/2014  . S/P deep brain stimulator placement 05/08/2014  . Joint pain 01/22/2014  . Medicare annual wellness visit, initial 01/19/2014  . Advance care planning 01/19/2014  . Parkinson's disease ([place]) 12/13/2013  . PTSD (post-traumatic stress disorder) 06/13/2013  . Erectile dysfunction 06/13/2013  . HLD (hyperlipidemia) 06/13/2013  . Hip pain 06/10/2013  . Pain in joint, shoulder region 06/10/2013  .  Right leg swelling 06/03/2013  . Essential hypertension 06/03/2013  . Bradycardia by electrocardiogram 06/03/2013  . Obstructive sleep apnea 03/12/2013  . Spinal stenosis, lumbar region, with neurogenic claudication 12/14/2012   Janna Arch, PT, DPT   08/07/2017, 10:26 AM  [place] MAIN [facility] SERVICES [place], [place], [address] Phone: [phone]   Fax:  [phone]  Name: [name] MRN: [id number] Date of Birth: Aug 24, 1950

## 2017-08-07 NOTE — Therapy (Signed)
[place] MAIN [facility] SERVICES [address] [place], [place], [address] Phone: [phone]   Fax:  [phone]  Occupational Therapy Treatment  Patient Details  Name: [name] MRN: [id number] Date of Birth: 1950/06/26 Referring Provider: Wells Guiles Tat   Encounter Date: 08/07/2017  OT End of Session - 08/07/17 0857    Visit Number  3    Number of Visits  24    Date for OT Re-Evaluation  10/23/17    Authorization Type  Visit 3 of 10 for progress reporting period starting 10/23/2017    OT Start Time  0845    OT Stop Time  0930    OT Time Calculation (min)  45 min    Activity Tolerance  Patient tolerated treatment well    Behavior During Therapy  [facility] for tasks assessed/performed       Past Medical History:  Diagnosis Date  . Arthritis   . Bradycardia   . Cancer [facility]) 2013   skin cancer  . Depression    ptsd  . Dysrhythmia    chronic slow heart rate  . GERD (gastroesophageal reflux disease)   . Headache(784.0)    tension headaches non recent  . History of chicken pox   . History of kidney stones    passed  . Hypertension    treated with HCTZ  . Parkinson's disease ([place])    dx'ed 15 years ago  . PTSD (post-traumatic stress disorder)   . Shortness of breath dyspnea   . Sleep apnea    doesn't use C-pap  . Varicose veins     Past Surgical History:  Procedure Laterality Date  . CHOLECYSTECTOMY N/A 10/22/2014   Procedure: LAPAROSCOPIC CHOLECYSTECTOMY WITH INTRAOPERATIVE CHOLANGIOGRAM;  Surgeon: Dia Crawford III, MD;  Location: [facility] ORS;  Service: General;  Laterality: N/A;  . cyst removed      from lip as a child  . LUMBAR LAMINECTOMY/DECOMPRESSION MICRODISCECTOMY Bilateral 12/14/2012   Procedure: Bilateral lumbar three-four, four-five decompressive laminotomy/foraminotomy;  Surgeon: Charlie Pitter, MD;  Location: [place] NEURO ORS;  Service: Neurosurgery;  Laterality: Bilateral;  . PULSE GENERATOR IMPLANT Bilateral 12/13/2013   Procedure:  Bilateral implantable pulse generator placement;  Surgeon: Erline Levine, MD;  Location: [place] NEURO ORS;  Service: Neurosurgery;  Laterality: Bilateral;  Bilateral implantable pulse generator placement  . skin cancer removed     from ears,   12 lft arm  rt leg 15  . SUBTHALAMIC STIMULATOR BATTERY REPLACEMENT Bilateral 07/14/2017   Procedure: BILATERAL IMPLANTED PULSE GENERATOR CHANGE FOR DEEP BRAIN STIMULATOR;  Surgeon: Erline Levine, MD;  Location: [place];  Service: Neurosurgery;  Laterality: Bilateral;  . SUBTHALAMIC STIMULATOR INSERTION Bilateral 12/06/2013   Procedure: SUBTHALAMIC STIMULATOR INSERTION;  Surgeon: Erline Levine, MD;  Location: [place] NEURO ORS;  Service: Neurosurgery;  Laterality: Bilateral;  Bilateral deep brain stimulator placement    There were no vitals filed for this visit.  Subjective Assessment - 08/07/17 0853    Subjective   Pt. present with therapy alone today.    Patient is accompained by:  Family member    Pertinent History  Pt. is a 67 y.o. mlae who has a history of Parkinson's Disease. Pt. has a Deep Brain Stimulator in place. Pt. has a history of Low Back Pain with 2 back surgeries in August 2014, and February 2016. Pt. has a history of multiple frequent falls occurring at a rate of 6x's a week.     Patient Stated Goals  To return home.  Currently in Pain?  Yes    Pain Location  Shoulder    Pain Orientation  Right    Pain Descriptors / Indicators  Aching       OT TREATMENT    Neuro muscular re-education:  Pt. worked on grasping 1" resistive cubes alternating thumb opposition to the tip of the 2nd through 5th digits while the board is placed at a vertical angle. Pt. worked on pressing the cubes back into place with 2nd digit extension. Pt. performed [facility] skills training to improve speed and dexterity needed for ADL tasks and writing. Pt. demonstrated grasping 1 inch sticks,  inch cylindrical collars, and  inch flat washers on the Purdue pegboard. Pt. performed  grasping each item with her 2nd digit and thumb, and storing them in the palm. Pt. presented with difficulty storing  inch objects at a time in the palmar aspect of the hand, and translatory movements of the hand. Pt. Had difficulty grasping objects form horizontal position. Pt. Worked on bilateral alternating hand movements. Pt. Had difficulty coordinating alternating hand movements.  Therapeutic Exercise:  Pt. performed gross gripping with grip strengthener. Pt. worked on sustaining grip while grasping pegs and reaching at various heights. Gripper was placed in the 2nd resistive slot with the white resistive spring. Pt. worked on pinch strengthening in the left hand for lateral, and 3pt. pinch using yellow, red, green, and blue resistive clips. Pt. worked on placing the clips at various vertical and horizontal angles. Tactile and verbal cues were required for the movement patterns.                                 OT Long Term Goals - 07/31/17 1122      OT LONG TERM GOAL #1   Title  Pt. will improve Bilateral UE strength by 1 mm grade to assist with ADLs, and IADLs    Baseline  EVal: BUE strength: 4+/5 overall    Time  12    Period  Weeks    Status  On-going    Target Date  10/23/17      OT LONG TERM GOAL #2   Title  Pt. will improve grip strength to be able to pour himself a beverage.    Baseline  Eval: Pt. is unable to hold and pour a beevrage.    Time  12    Period  Weeks    Status  New    Target Date  10/23/17      OT LONG TERM GOAL #3   Title  Pt. will improve lateral pinch stength to be able to independently turn a key.     Baseline  Eval: Pt. has difficulty holding, and turning  key.    Time  12    Period  Weeks    Status  New    Target Date  10/23/17      OT LONG TERM GOAL #4   Title  Pt. will improve Right hand [facility] skills to be able to independently grasp and use small objects needed for fixing car parts.    Baseline  Eval: Right hand 1 min.  & 5 sec. on the 9 hole peg test.    Time  12    Period  Weeks    Status  New    Target Date  10/23/17      OT LONG TERM GOAL #5   Title  Pt. will  be demonstarte self-feeding with modified independence with minimal spillage.    Baseline  Eval: Pt. requires assistance, and has alot of spillage.    Time  12    Period  Weeks    Status  On-going    Target Date  10/23/17      Long Term Additional Goals   Additional Long Term Goals  --      OT LONG TERM GOAL #6   Title  Pt. will manipulate buttons, and cuff buttons independently.    Baseline  Eval: Pt. has difficulty     Time  12    Target Date  10/23/17      OT LONG TERM GOAL #7   Title  Pt. will write 1 sentence efficiently with 90% legibility.    Baseline  Eval: mname only 50% legibility    Time  12    Period  Weeks    Status  New    Target Date  10/23/17            Plan - 08/07/17 0858    Clinical Impression Statement  Pt. reports having had a fall two days ago. Pt. reports that he fell into a glass cabinet, broke the glass, and hurt his right shoulder. Pt. continues to present with limited bilateral hand strength, motor control, and coordination skills with difficulty with Craig, and translatory movements of the hand. Pt. continues to work on improving UE functioning for improved engagement in ADL tasks.    Occupational performance deficits (Please refer to evaluation for details):  ADL's;IADL's    Rehab Potential  Good    Current Impairments/barriers affecting progress:  Positive Indicators: age, familiy support, motivation. Negative Indicators: Multiple comorbidities, and history of falls.    OT Frequency  2x / week    OT Duration  12 weeks    OT Treatment/Interventions  Self-care/ADL training;Therapeutic exercise;DME and/or AE instruction;Energy conservation;Neuromuscular education;Patient/family education;Therapeutic activities    Clinical Decision Making  Several treatment options, min-mod task modification necessary     Consulted and Agree with Plan of Care  Patient       Patient will benefit from skilled therapeutic intervention in order to improve the following deficits and impairments:  Pain, Decreased strength, Decreased coordination, Decreased balance, Impaired UE functional use, Decreased safety awareness, Decreased activity tolerance, Decreased cognition, Impaired flexibility, Impaired vision/preception  Visit Diagnosis: Muscle weakness (generalized)    Problem List Patient Active Problem List   Diagnosis Date Noted  . 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 Radicular pain in right arm 10/09/2016  . Rash 09/03/2016  . GERD (gastroesophageal reflux disease) 07/31/2016  . Trochanteric bursitis 03/03/2016  . Left hand pain 10/30/2015  . Fracture, finger, multiple sites 10/30/2015  . Colon cancer screening 07/23/2015  . Healthcare maintenance 07/23/2015  . Gout 02/18/2015  . Depression 01/06/2015  . Irritation of eyelid 08/20/2014  . Dupuytren's contracture 08/20/2014  . Cough 07/21/2014  . Lumbar stenosis with neurogenic claudication 06/13/2014  . Preop cardiovascular exam 05/28/2014  . S/P deep brain stimulator placement 05/08/2014  . Joint pain 01/22/2014  . Medicare annual wellness visit, initial 01/19/2014  . Advance care planning 01/19/2014  . Parkinson's disease ([place]) 12/13/2013  . PTSD (post-traumatic stress disorder) 06/13/2013  . Erectile dysfunction 06/13/2013  . HLD (hyperlipidemia) 06/13/2013  . Hip pain 06/10/2013  . Pain in joint, shoulder region 06/10/2013  . Right leg swelling 06/03/2013  . Essential hypertension 06/03/2013  . Bradycardia by electrocardiogram 06/03/2013  . Obstructive sleep apnea 03/12/2013  .  Spinal stenosis, lumbar region, with neurogenic claudication 12/14/2012    Harrel Carina, MS, OTR/L 08/07/2017, 9:09 AM  [place] MAIN [facility] SERVICES [place], [place], [address] Phone: [phone]    Fax:  [phone]  Name: JERAMEY LANUZA MRN: [id number] Date of Birth: 04-Mar-1951

## 2017-08-08 ENCOUNTER — Ambulatory Visit (INDEPENDENT_AMBULATORY_CARE_PROVIDER_SITE_OTHER): Payer: Medicare PPO | Admitting: Psychology

## 2017-08-08 DIAGNOSIS — F331 Major depressive disorder, recurrent, moderate: Secondary | ICD-10-CM

## 2017-08-08 DIAGNOSIS — F4323 Adjustment disorder with mixed anxiety and depressed mood: Secondary | ICD-10-CM | POA: Diagnosis not present

## 2017-08-10 ENCOUNTER — Ambulatory Visit: Payer: Medicare PPO | Admitting: Occupational Therapy

## 2017-08-10 ENCOUNTER — Ambulatory Visit: Payer: Self-pay | Admitting: Psychology

## 2017-08-14 ENCOUNTER — Ambulatory Visit: Payer: Medicare PPO

## 2017-08-14 ENCOUNTER — Ambulatory Visit: Payer: Medicare PPO | Admitting: Occupational Therapy

## 2017-08-14 ENCOUNTER — Encounter: Payer: Self-pay | Admitting: Occupational Therapy

## 2017-08-14 DIAGNOSIS — M6281 Muscle weakness (generalized): Secondary | ICD-10-CM | POA: Diagnosis not present

## 2017-08-14 DIAGNOSIS — E785 Hyperlipidemia, unspecified: Secondary | ICD-10-CM | POA: Diagnosis not present

## 2017-08-14 DIAGNOSIS — R278 Other lack of coordination: Secondary | ICD-10-CM

## 2017-08-14 DIAGNOSIS — R2689 Other abnormalities of gait and mobility: Secondary | ICD-10-CM

## 2017-08-14 DIAGNOSIS — R279 Unspecified lack of coordination: Secondary | ICD-10-CM | POA: Diagnosis not present

## 2017-08-14 DIAGNOSIS — K219 Gastro-esophageal reflux disease without esophagitis: Secondary | ICD-10-CM | POA: Diagnosis not present

## 2017-08-14 DIAGNOSIS — R296 Repeated falls: Secondary | ICD-10-CM

## 2017-08-14 DIAGNOSIS — G20 Parkinson's disease: Secondary | ICD-10-CM | POA: Diagnosis not present

## 2017-08-14 DIAGNOSIS — Z6827 Body mass index (BMI) 27.0-27.9, adult: Secondary | ICD-10-CM | POA: Diagnosis not present

## 2017-08-14 DIAGNOSIS — R2681 Unsteadiness on feet: Secondary | ICD-10-CM | POA: Diagnosis not present

## 2017-08-14 DIAGNOSIS — R001 Bradycardia, unspecified: Secondary | ICD-10-CM | POA: Diagnosis not present

## 2017-08-14 DIAGNOSIS — R413 Other amnesia: Secondary | ICD-10-CM | POA: Diagnosis not present

## 2017-08-14 NOTE — Therapy (Signed)
[place] MA[facility] SERVICES [address] [place], [place], [address] Phone: [phone]   Fax:  [phone]  Physical Therapy Treatment  Patient Details  Name: [name] MRN: [id number] Date of Birth: 03/23/1951 Referring Provider: Wells Guiles Tat   Encounter Date: 08/14/2017  PT End of Session - 08/14/17 0949    Visit Number  33    Number of Visits  63    Date for PT Re-Evaluation  09/13/17    Authorization Type  5/10 of progress note    PT Start Time  0932    PT Stop Time  1015    PT Time Calculation (min)  43 min    Equipment Utilized During Treatment  Gait belt    Activity Tolerance  Patient tolerated treatment well;Treatment limited secondary to medical complications (Comment)    Behavior During Therapy  [facility] for tasks assessed/performed       Past Medical History:  Diagnosis Date  . Arthritis   . Bradycardia   . Cancer [facility]) 2013   skin cancer  . Depression    ptsd  . Dysrhythmia    chronic slow heart rate  . GERD (gastroesophageal reflux disease)   . Headache(784.0)    tension headaches non recent  . History of chicken pox   . History of kidney stones    passed  . Hypertension    treated with HCTZ  . Parkinson's disease ([place])    dx'ed 15 years ago  . PTSD (post-traumatic stress disorder)   . Shortness of breath dyspnea   . Sleep apnea    doesn't use C-pap  . Varicose veins     Past Surgical History:  Procedure Laterality Date  . CHOLECYSTECTOMY N/A 10/22/2014   Procedure: LAPAROSCOPIC CHOLECYSTECTOMY WITH INTRAOPERATIVE CHOLANGIOGRAM;  Surgeon: Dia Crawford III, MD;  Location: [facility] ORS;  Service: General;  Laterality: N/A;  . cyst removed      from lip as a child  . LUMBAR LAMINECTOMY/DECOMPRESSION MICRODISCECTOMY Bilateral 12/14/2012   Procedure: Bilateral lumbar three-four, four-five decompressive laminotomy/foraminotomy;  Surgeon: Charlie Pitter, MD;  Location: [place] NEURO ORS;  Service: Neurosurgery;  Laterality:  Bilateral;  . PULSE GENERATOR IMPLANT Bilateral 12/13/2013   Procedure: Bilateral implantable pulse generator placement;  Surgeon: Erline Levine, MD;  Location: [place] NEURO ORS;  Service: Neurosurgery;  Laterality: Bilateral;  Bilateral implantable pulse generator placement  . skin cancer removed     from ears,   12 lft arm  rt leg 15  . SUBTHALAMIC STIMULATOR BATTERY REPLACEMENT Bilateral 07/14/2017   Procedure: BILATERAL IMPLANTED PULSE GENERATOR CHANGE FOR DEEP BRAIN STIMULATOR;  Surgeon: Erline Levine, MD;  Location: [place];  Service: Neurosurgery;  Laterality: Bilateral;  . SUBTHALAMIC STIMULATOR INSERTION Bilateral 12/06/2013   Procedure: SUBTHALAMIC STIMULATOR INSERTION;  Surgeon: Erline Levine, MD;  Location: [place] NEURO ORS;  Service: Neurosurgery;  Laterality: Bilateral;  Bilateral deep brain stimulator placement    There were no vitals filed for this visit.  Subjective Assessment - 08/14/17 0937    Subjective  Patient reports 3 falls over the weekends. One time was when trying to pick something up off the floor. Has bruise on L trunk.     Pertinent History  Patient referred for physical therapy due to wife's request for patient to get aquatic therapy to help with his conditioning. Pt is also complaining of chronic low back and R hip pain and frequent falls. 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 The last time he was in the water he was very uncomfortable because he didn't feel like he had any control. Pt has a history of 2 low back surgeries in August 2014 and February 2016. Pt and wife are unable to recall exactly what was performed but they know that he had stenosis. Surgeries were performed by Dr. Annette Stable in [place]. Pt reports that his low back and R hip pain are constant. He describes the pain in the back as "dull" and the pain in the hip as "sharp." For both back and hip worst pain is 10/10, Best: 2/10, Present: 2/10. No particular  episode of trauma to his low back or hip preceding the pain. Pt has a history of Parkinson's with a deep brain stimulator. He ambulates with a 4 wheeled walker called a U-step (automatic brakes, audio cues for stepping). He states that he falls frequently and reports that he has had approximately 40-50 falls in the last 6 months. No specific recent changes in his health.     Limitations  Walking;Standing;Sitting    How long can you sit comfortably?  30 minutes    How long can you stand comfortably?  5 minutes    How long can you walk comfortably?  10 minutes    Patient Stated Goals  Improve comfort in the water, improve balance, decrease back and R hip pain    Currently in Pain?  Yes    Pain Score  3     Pain Location  Back    Pain Orientation  Lower    Pain Descriptors / Indicators  Aching    Pain Type  Acute pain    Pain Onset  In the past 7 days    Pain Frequency  Constant    Pain Score  4    Pain Location  Shoulder    Pain Orientation  Right    Pain Descriptors / Indicators  Aching    Pain Type  Chronic pain       Nustep Lvl 4 4 minutes   SPT from WC to Nustep required 2 attempts with PT requiring patient to stop count out loud to 5 before retrying to to patient becoming agitated and beginning to flail.     IT band roller seated position 2 minutes     5lb ankle weight marching standing 20x BUE support   6" step toe taps 20x each leg: cues for where to place foot and frequency of taps with verbalization of 1 and 2 for timing. Require metranome for 40bpm.    6" side toe taps 20x each leg. BUE support.   Pick up cone from 6" step to practice proper technique to decrease fall risk due to patient increase falls from picking up objects, wide BOS, chest up, center of mass. 8x.   Walking in // bars  With wooden dowel between feet to increase BOS with cues for speed of steps with PT calling 1,2,3,4 : improved gait mechanics and heel strike.   Airex pad balance 2x30 seconds     Side step on Airex balance beam in // bars 8x length of // bars without UE support. Frequent pauses to correct posterior LOB with tactile and verbal cueing of string from ceiling through feet for COM.       Pt. response to medical necessity:   Patient will continue to benefit from skilled physical therapy to improve static and dynamic standing balance and decrease fall risk for  improved functional capacity of activities                          PT Education - 08/14/17 [phone]    Education provided  Yes    Education Details  exercise technique     Person(s) Educated  Patient    Methods  Explanation;Demonstration;Verbal cues    Comprehension  Verbalized understanding;Returned demonstration       PT Short Term Goals - 07/19/17 1741      PT SHORT TERM GOAL #1   Title  Pt will perform HEP with family's supervision, for improved balance, transfers, and gait.      Baseline  patient reports compliance, however not every day    Time  4    Period  Weeks    Status  Partially Met      PT SHORT TERM GOAL #2   Title  Pt will perform 5x sit<>stand in less than or equal to 15 seconds for improved efficiency and safety with transfers.    Baseline  19 seconds; deffered at this time due to excessive LOB     Time  2    Period  Weeks    Status  On-going        PT Long Term Goals - 07/19/17 1742      PT LONG TERM GOAL #1   Title  Pt will decrease 5TSTS by at least 3 seconds in order to demonstrate clinically significant improvement in LE strength     Baseline  02/08/17: 20.1 seconds 12/18: 17 seconds ;    Time  8    Period  Weeks    Status  Achieved      PT LONG TERM GOAL #2   Title  Pt will decrease TUG by at least 3.4s in order to demonstrate decreased fall risk     Baseline  02/08/17: 22.1s 12/18: 20 seconds 2/7: 19 seconds; 4/3: 23 seconds with walker    Time  8    Period  Weeks    Status  Partially Met    Target Date  09/13/17      PT LONG TERM GOAL #3   Title   Pt will improve BERG by at least 3 points in order to demonstrate clinically significant improvement in balance.    Baseline  02/08/17: 30/56 12/18: 38/56      Time  8    Period  Weeks    Status  Achieved      PT LONG TERM GOAL #4   Title  Pt/wife will verbalize understanding of plans for continued community fitness upon D/C from PT including aquatic therapy program    Baseline  02/08/17: pt currently reports being more comfortable in the water    Time  8    Period  Weeks    Status  Partially Met    Target Date  09/13/17      PT LONG TERM GOAL #5   Title  Pt will decrease mODI scoreby at least 13 points in order demonstrate clinically significant reduction in pain/disability     Baseline  02/08/17: 54% 12/18: 46% 2/7: 42% 4/3; 50%    Time  8    Period  Weeks    Status  Partially Met    Target Date  09/13/17      PT LONG TERM GOAL #6   Title  Pt will improve BERG to 44/56  in order to demonstrate clinically significant improvement  in balance for decreased fall risk    Baseline  12/18: 38/56 2/7: 41/56 4/3: 41/56    Time  8    Period  Weeks    Status  Partially Met    Target Date  09/13/17            Plan - 08/14/17 1002    Clinical Impression Statement  Patient challenged by slowing down interventions requiring metronome set at 40bpm. Patient demonstrated understanding of proper lifting technique with decreased need for verbal cueing, however will continue to benefit from additional practice due to carryover deficit.  Patient will continue to benefit from skilled physical therapy to improve static and dynamic standing balance and decrease fall risk for improved functional capacity of activities     Rehab Potential  Fair    PT Frequency  2x / week    PT Duration  8 weeks    PT Treatment/Interventions  ADLs/Self Care Home Management;Aquatic Therapy;Cryotherapy;Electrical Stimulation;Iontophoresis 45m/ml Dexamethasone;Moist Heat;Ultrasound;Contrast Bath;DME Instruction;Gait  training;Stair training;Functional mobility training;Therapeutic activities;Therapeutic exercise;Balance training;Neuromuscular re-education;Patient/family education;Manual techniques;Wheelchair mobility training;Energy conservation;Passive range of motion    PT Next Visit Plan  progress balance, strength, aquatic therapy    PT Home Exercise Plan  None provided at this time until further assessed, wife reports [facility]    Consulted and Agree with Plan of Care  Patient;Family member/caregiver    Family Member Consulted  wife       Patient will benefit from skilled therapeutic intervention in order to improve the following deficits and impairments:  Abnormal gait, Decreased balance, Decreased coordination, Decreased mobility, Impaired tone, Postural dysfunction  Visit Diagnosis: Muscle weakness (generalized)  Other lack of coordination  Repeated falls  Other abnormalities of gait and mobility     Problem List Patient Active Problem List   Diagnosis Date Noted  . 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 Radicular pain in right arm 10/09/2016  . Rash 09/03/2016  . GERD (gastroesophageal reflux disease) 07/31/2016  . Trochanteric bursitis 03/03/2016  . Left hand pain 10/30/2015  . Fracture, finger, multiple sites 10/30/2015  . Colon cancer screening 07/23/2015  . Healthcare maintenance 07/23/2015  . Gout 02/18/2015  . Depression 01/06/2015  . Irritation of eyelid 08/20/2014  . Dupuytren's contracture 08/20/2014  . Cough 07/21/2014  . Lumbar stenosis with neurogenic claudication 06/13/2014  . Preop cardiovascular exam 05/28/2014  . S/P deep brain stimulator placement 05/08/2014  . Joint pain 01/22/2014  . Medicare annual wellness visit, initial 01/19/2014  . Advance care planning 01/19/2014  . Parkinson's disease ([place] 12/13/2013  . PTSD (post-traumatic stress disorder) 06/13/2013  . Erectile dysfunction 06/13/2013  . HLD (hyperlipidemia)  06/13/2013  . Hip pain 06/10/2013  . Pain in joint, shoulder region 06/10/2013  . Right leg swelling 06/03/2013  . Essential hypertension 06/03/2013  . Bradycardia by electrocardiogram 06/03/2013  . Obstructive sleep apnea 03/12/2013  . Spinal stenosis, lumbar region, with neurogenic claudication 12/14/2012  MJanna Arch PT, DPT   08/14/2017, 10:18 AM  [facility] [address] [address]Phone: [phone]  Fax:  [phone] Name: George GRANDISONMRN: [id number] of Birth: [birth year]

## 2017-08-14 NOTE — Therapy (Signed)
[place] MAIN [facility] SERVICES [address] [place], [place], [address] Phone: [phone]   Fax:  [phone]  Occupational Therapy Treatment  Patient Details  Name: [name] MRN: [id number] Date of Birth: 20-Jan-1951 Referring Provider: Wells Guiles Tat   Encounter Date: 08/14/2017  OT End of Session - 08/14/17 0904    Visit Number  4    Number of Visits  24    Date for OT Re-Evaluation  10/23/17    Authorization Type  Visit 4 of 10 for progress reporting period starting 10/23/2017    OT Start Time  0847    OT Stop Time  0930    OT Time Calculation (min)  43 min    Activity Tolerance  Patient tolerated treatment well    Behavior During Therapy  [facility] for tasks assessed/performed       Past Medical History:  Diagnosis Date  . Arthritis   . Bradycardia   . Cancer [facility]) 2013   skin cancer  . Depression    ptsd  . Dysrhythmia    chronic slow heart rate  . GERD (gastroesophageal reflux disease)   . Headache(784.0)    tension headaches non recent  . History of chicken pox   . History of kidney stones    passed  . Hypertension    treated with HCTZ  . Parkinson's disease ([place])    dx'ed 15 years ago  . PTSD (post-traumatic stress disorder)   . Shortness of breath dyspnea   . Sleep apnea    doesn't use C-pap  . Varicose veins     Past Surgical History:  Procedure Laterality Date  . CHOLECYSTECTOMY N/A 10/22/2014   Procedure: LAPAROSCOPIC CHOLECYSTECTOMY WITH INTRAOPERATIVE CHOLANGIOGRAM;  Surgeon: Dia Crawford III, MD;  Location: [facility] ORS;  Service: General;  Laterality: N/A;  . cyst removed      from lip as a child  . LUMBAR LAMINECTOMY/DECOMPRESSION MICRODISCECTOMY Bilateral 12/14/2012   Procedure: Bilateral lumbar three-four, four-five decompressive laminotomy/foraminotomy;  Surgeon: Charlie Pitter, MD;  Location: [place] NEURO ORS;  Service: Neurosurgery;  Laterality: Bilateral;  . PULSE GENERATOR IMPLANT Bilateral 12/13/2013   Procedure:  Bilateral implantable pulse generator placement;  Surgeon: Erline Levine, MD;  Location: [place] NEURO ORS;  Service: Neurosurgery;  Laterality: Bilateral;  Bilateral implantable pulse generator placement  . skin cancer removed     from ears,   12 lft arm  rt leg 15  . SUBTHALAMIC STIMULATOR BATTERY REPLACEMENT Bilateral 07/14/2017   Procedure: BILATERAL IMPLANTED PULSE GENERATOR CHANGE FOR DEEP BRAIN STIMULATOR;  Surgeon: Erline Levine, MD;  Location: [place];  Service: Neurosurgery;  Laterality: Bilateral;  . SUBTHALAMIC STIMULATOR INSERTION Bilateral 12/06/2013   Procedure: SUBTHALAMIC STIMULATOR INSERTION;  Surgeon: Erline Levine, MD;  Location: [place] NEURO ORS;  Service: Neurosurgery;  Laterality: Bilateral;  Bilateral deep brain stimulator placement    There were no vitals filed for this visit.  Subjective Assessment - 08/14/17 0859    Subjective   Pt. reports back, and shouldr pain.    Patient is accompained by:  Family member    Pertinent History  Pt. is a 67 y.o. mlae who has a history of Parkinson's Disease. Pt. has a Deep Brain Stimulator in place. Pt. has a history of Low Back Pain with 2 back surgeries in August 2014, and February 2016. Pt. has a history of multiple frequent falls occurring at a rate of 6x's a week.     Limitations  Frequent falls, limited motor control,  and [place].     Currently in Pain?  Yes    Pain Score  6     Pain Location  Back    Pain Score  3    Pain Location  Shoulder    Pain Orientation  Right      OT TREATMENT    Neuro muscular re-education:  Pt. performed [facility] skills training to improve speed and dexterity needed for ADL tasks and writing. Pt. demonstrated grasping 1 inch sticks,  inch cylindrical collars, and  inch flat washers on the Purdue pegboard. Pt. performed grasping each item with her 2nd digit and thumb, and storing them in the palm. Pt. presented with difficulty storing  inch objects at a time in the palmar aspect of the hand. Pt. Worked on grasping  one inch resistive cubes alternating thumb opposition to the tip of the 2nd through 5th digits. The board was positioned at a vertical angle. Pt. worked on pressing them back into place while isolating 2nd through 5th digits.  Therapeutic Exercise:  Pt. performed gross gripping with grip strengthener. Pt. worked on sustaining grip while grasping pegs and reaching at various heights. Gripper was placed in the 3rd resistive slot with the white resistive spring.                               OT Long Term Goals - 07/31/17 1122      OT LONG TERM GOAL #1   Title  Pt. will improve Bilateral UE strength by 1 mm grade to assist with ADLs, and IADLs    Baseline  EVal: BUE strength: 4+/5 overall    Time  12    Period  Weeks    Status  On-going    Target Date  10/23/17      OT LONG TERM GOAL #2   Title  Pt. will improve grip strength to be able to pour himself a beverage.    Baseline  Eval: Pt. is unable to hold and pour a beevrage.    Time  12    Period  Weeks    Status  New    Target Date  10/23/17      OT LONG TERM GOAL #3   Title  Pt. will improve lateral pinch stength to be able to independently turn a key.     Baseline  Eval: Pt. has difficulty holding, and turning  key.    Time  12    Period  Weeks    Status  New    Target Date  10/23/17      OT LONG TERM GOAL #4   Title  Pt. will improve Right hand [facility] skills to be able to independently grasp and use small objects needed for fixing car parts.    Baseline  Eval: Right hand 1 min. & 5 sec. on the 9 hole peg test.    Time  12    Period  Weeks    Status  New    Target Date  10/23/17      OT LONG TERM GOAL #5   Title  Pt. will be demonstarte self-feeding with modified independence with minimal spillage.    Baseline  Eval: Pt. requires assistance, and has alot of spillage.    Time  12    Period  Weeks    Status  On-going    Target Date  10/23/17      Long Term Additional  Goals   Additional Long Term  Goals  --      OT LONG TERM GOAL #6   Title  Pt. will manipulate buttons, and cuff buttons independently.    Baseline  Eval: Pt. has difficulty     Time  12    Target Date  10/23/17      OT LONG TERM GOAL #7   Title  Pt. will write 1 sentence efficiently with 90% legibility.    Baseline  Eval: mname only 50% legibility    Time  12    Period  Weeks    Status  New    Target Date  10/23/17            Plan - 08/14/17 0905    Clinical Impression Statement Pt. reports that he  had a fall in the garage last Friday. pt. reports that he fell square on his back, but did not hit his head. Pt. reports having back pain today. Pt. continues to present with limited UE strength, hand function, coordination skills, and continues to work on improving bilateral hand coordination skills, and translatory movements of the hand.      Occupational performance deficits (Please refer to evaluation for details):  ADL's;IADL's    Rehab Potential  Good    Current Impairments/barriers affecting progress:  Positive Indicators: age, familiy support, motivation. Negative Indicators: Multiple comorbidities, and history of falls.    OT Frequency  2x / week    OT Treatment/Interventions  Self-care/ADL training;Therapeutic exercise;DME and/or AE instruction;Energy conservation;Neuromuscular education;Patient/family education;Therapeutic activities    Clinical Decision Making  Several treatment options, min-mod task modification necessary    Consulted and Agree with Plan of Care  Patient       Patient will benefit from skilled therapeutic intervention in order to improve the following deficits and impairments:  Pain, Decreased strength, Decreased coordination, Decreased balance, Impaired UE functional use, Decreased safety awareness, Decreased activity tolerance, Decreased cognition, Impaired flexibility, Impaired vision/preception  Visit Diagnosis: Muscle weakness (generalized)  Other lack of  coordination    Problem List Patient Active Problem List   Diagnosis Date Noted  . 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 Radicular pain in right arm 10/09/2016  . Rash 09/03/2016  . GERD (gastroesophageal reflux disease) 07/31/2016  . Trochanteric bursitis 03/03/2016  . Left hand pain 10/30/2015  . Fracture, finger, multiple sites 10/30/2015  . Colon cancer screening 07/23/2015  . Healthcare maintenance 07/23/2015  . Gout 02/18/2015  . Depression 01/06/2015  . Irritation of eyelid 08/20/2014  . Dupuytren's contracture 08/20/2014  . Cough 07/21/2014  . Lumbar stenosis with neurogenic claudication 06/13/2014  . Preop cardiovascular exam 05/28/2014  . S/P deep brain stimulator placement 05/08/2014  . Joint pain 01/22/2014  . Medicare annual wellness visit, initial 01/19/2014  . Advance care planning 01/19/2014  . Parkinson's disease ([place]) 12/13/2013  . PTSD (post-traumatic stress disorder) 06/13/2013  . Erectile dysfunction 06/13/2013  . HLD (hyperlipidemia) 06/13/2013  . Hip pain 06/10/2013  . Pain in joint, shoulder region 06/10/2013  . Right leg swelling 06/03/2013  . Essential hypertension 06/03/2013  . Bradycardia by electrocardiogram 06/03/2013  . Obstructive sleep apnea 03/12/2013  . Spinal stenosis, lumbar region, with neurogenic claudication 12/14/2012    Harrel Carina, MS, OTR/L 08/14/2017, 9:20 AM  [place] MAIN [facility] SERVICES [place], [place], [address] Phone: [phone]   Fax:  [phone]  Name: [name] MRN: [id number] Date of Birth: [birth year]

## 2017-08-21 ENCOUNTER — Ambulatory Visit: Payer: Medicare PPO | Attending: Neurology | Admitting: Occupational Therapy

## 2017-08-21 ENCOUNTER — Encounter: Payer: Self-pay | Admitting: Occupational Therapy

## 2017-08-21 DIAGNOSIS — R296 Repeated falls: Secondary | ICD-10-CM | POA: Insufficient documentation

## 2017-08-21 DIAGNOSIS — R278 Other lack of coordination: Secondary | ICD-10-CM | POA: Insufficient documentation

## 2017-08-21 DIAGNOSIS — R293 Abnormal posture: Secondary | ICD-10-CM | POA: Insufficient documentation

## 2017-08-21 DIAGNOSIS — M6281 Muscle weakness (generalized): Secondary | ICD-10-CM | POA: Insufficient documentation

## 2017-08-21 DIAGNOSIS — R279 Unspecified lack of coordination: Secondary | ICD-10-CM | POA: Diagnosis not present

## 2017-08-21 DIAGNOSIS — R2689 Other abnormalities of gait and mobility: Secondary | ICD-10-CM | POA: Insufficient documentation

## 2017-08-21 DIAGNOSIS — R2681 Unsteadiness on feet: Secondary | ICD-10-CM | POA: Diagnosis not present

## 2017-08-21 NOTE — Therapy (Signed)
[place] MAIN [facility] SERVICES [address] [place], [place], [address] Phone: [phone]   Fax:  [phone]  Occupational Therapy Treatment  Patient Details  Name: [name] MRN: [id number] Date of Birth: 07-03-1950 Referring Provider: Wells Guiles Tat   Encounter Date: 08/21/2017  OT End of Session - 08/21/17 0906    Visit Number  5    Number of Visits  24    Date for OT Re-Evaluation  10/23/17    Authorization Type  Visit 5 of 10 for progress reporting period starting 07/31/2017    OT Start Time  0845    OT Stop Time  0930    OT Time Calculation (min)  45 min    Activity Tolerance  Patient tolerated treatment well    Behavior During Therapy  [facility] for tasks assessed/performed       Past Medical History:  Diagnosis Date  . Arthritis   . Bradycardia   . Cancer [facility]) 2013   skin cancer  . Depression    ptsd  . Dysrhythmia    chronic slow heart rate  . GERD (gastroesophageal reflux disease)   . Headache(784.0)    tension headaches non recent  . History of chicken pox   . History of kidney stones    passed  . Hypertension    treated with HCTZ  . Parkinson's disease ([place])    dx'ed 15 years ago  . PTSD (post-traumatic stress disorder)   . Shortness of breath dyspnea   . Sleep apnea    doesn't use C-pap  . Varicose veins     Past Surgical History:  Procedure Laterality Date  . CHOLECYSTECTOMY N/A 10/22/2014   Procedure: LAPAROSCOPIC CHOLECYSTECTOMY WITH INTRAOPERATIVE CHOLANGIOGRAM;  Surgeon: Dia Crawford III, MD;  Location: [facility] ORS;  Service: General;  Laterality: N/A;  . cyst removed      from lip as a child  . LUMBAR LAMINECTOMY/DECOMPRESSION MICRODISCECTOMY Bilateral 12/14/2012   Procedure: Bilateral lumbar three-four, four-five decompressive laminotomy/foraminotomy;  Surgeon: Charlie Pitter, MD;  Location: [place] NEURO ORS;  Service: Neurosurgery;  Laterality: Bilateral;  . PULSE GENERATOR IMPLANT Bilateral 12/13/2013   Procedure:  Bilateral implantable pulse generator placement;  Surgeon: Erline Levine, MD;  Location: [place] NEURO ORS;  Service: Neurosurgery;  Laterality: Bilateral;  Bilateral implantable pulse generator placement  . skin cancer removed     from ears,   12 lft arm  rt leg 15  . SUBTHALAMIC STIMULATOR BATTERY REPLACEMENT Bilateral 07/14/2017   Procedure: BILATERAL IMPLANTED PULSE GENERATOR CHANGE FOR DEEP BRAIN STIMULATOR;  Surgeon: Erline Levine, MD;  Location: [place];  Service: Neurosurgery;  Laterality: Bilateral;  . SUBTHALAMIC STIMULATOR INSERTION Bilateral 12/06/2013   Procedure: SUBTHALAMIC STIMULATOR INSERTION;  Surgeon: Erline Levine, MD;  Location: [place] NEURO ORS;  Service: Neurosurgery;  Laterality: Bilateral;  Bilateral deep brain stimulator placement    There were no vitals filed for this visit.  Subjective Assessment - 08/21/17 0905    Subjective   Pt. reports no falls since last session.    Patient is accompained by:  Family member    Pertinent History  Pt. is a 67 y.o. mlae who has a history of Parkinson's Disease. Pt. has a Deep Brain Stimulator in place. Pt. has a history of Low Back Pain with 2 back surgeries in August 2014, and February 2016. Pt. has a history of multiple frequent falls occurring at a rate of 6x's a week.     Limitations  Frequent falls, limited motor  control, and [place].     Patient Stated Goals  To return home.    Currently in Pain?  Yes    Pain Score  4     Pain Location  Leg    Pain Orientation  Right       OT TREATMENT    Neuro muscular re-education:  Pt. worked on grasping one inch resistive cubes alternating thumb opposition to the tip of the 2nd through 5th digits. The board was positioned at a vertical angle. Pt. Worked on pressing them back into place while isolating 2nd through 5th digits. Pt. worked on grasping, flipping and stacking 2" large pegs on the Instructo board placed at a tabletop surface with bilateral hands. Pt. Worked on bilateral alternating hand  coordination. Pt. presented with incoordination.  Therapeutic Exercise:  Pt. performed gross gripping with grip strengthener. Pt. worked on sustaining grip while grasping pegs and reaching at various heights. Gripper was placed in the 3rd resistive slot with the white resistive spring.                           OT Long Term Goals - 07/31/17 1122      OT LONG TERM GOAL #1   Title  Pt. will improve Bilateral UE strength by 1 mm grade to assist with ADLs, and IADLs    Baseline  EVal: BUE strength: 4+/5 overall    Time  12    Period  Weeks    Status  On-going    Target Date  10/23/17      OT LONG TERM GOAL #2   Title  Pt. will improve grip strength to be able to pour himself a beverage.    Baseline  Eval: Pt. is unable to hold and pour a beevrage.    Time  12    Period  Weeks    Status  New    Target Date  10/23/17      OT LONG TERM GOAL #3   Title  Pt. will improve lateral pinch stength to be able to independently turn a key.     Baseline  Eval: Pt. has difficulty holding, and turning  key.    Time  12    Period  Weeks    Status  New    Target Date  10/23/17      OT LONG TERM GOAL #4   Title  Pt. will improve Right hand [facility] skills to be able to independently grasp and use small objects needed for fixing car parts.    Baseline  Eval: Right hand 1 min. & 5 sec. on the 9 hole peg test.    Time  12    Period  Weeks    Status  New    Target Date  10/23/17      OT LONG TERM GOAL #5   Title  Pt. will be demonstarte self-feeding with modified independence with minimal spillage.    Baseline  Eval: Pt. requires assistance, and has alot of spillage.    Time  12    Period  Weeks    Status  On-going    Target Date  10/23/17      Long Term Additional Goals   Additional Long Term Goals  --      OT LONG TERM GOAL #6   Title  Pt. will manipulate buttons, and cuff buttons independently.    Baseline  Eval: Pt. has difficulty  Time  12    Target Date   10/23/17      OT LONG TERM GOAL #7   Title  Pt. will write 1 sentence efficiently with 90% legibility.    Baseline  Eval: mname only 50% legibility    Time  12    Period  Weeks    Status  New    Target Date  10/23/17            Plan - 08/21/17 0909    Clinical Impression Statement  Pt. reports he has not had any falls since last week. Pt. continues to present with limited UE strength, and coordination skills. Pt. continues to work on improving BUE strength, and coordination for improved  ADL, and IADL functioning.    Occupational performance deficits (Please refer to evaluation for details):  ADL's;IADL's    Rehab Potential  Good    Current Impairments/barriers affecting progress:  Positive Indicators: age, familiy support, motivation. Negative Indicators: Multiple comorbidities, and history of falls.    OT Frequency  2x / week    OT Duration  12 weeks    OT Treatment/Interventions  Self-care/ADL training;Therapeutic exercise;DME and/or AE instruction;Energy conservation;Neuromuscular education;Patient/family education;Therapeutic activities    Consulted and Agree with Plan of Care  Patient       Patient will benefit from skilled therapeutic intervention in order to improve the following deficits and impairments:  Pain, Decreased strength, Decreased coordination, Decreased balance, Impaired UE functional use, Decreased safety awareness, Decreased activity tolerance, Decreased cognition, Impaired flexibility, Impaired vision/preception  Visit Diagnosis: Muscle weakness (generalized)  Other lack of coordination    Problem List Patient Active Problem List   Diagnosis Date Noted  . 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 Radicular pain in right arm 10/09/2016  . Rash 09/03/2016  . GERD (gastroesophageal reflux disease) 07/31/2016  . Trochanteric bursitis 03/03/2016  . Left hand pain 10/30/2015  . Fracture, finger, multiple sites 10/30/2015  . Colon cancer screening  07/23/2015  . Healthcare maintenance 07/23/2015  . Gout 02/18/2015  . Depression 01/06/2015  . Irritation of eyelid 08/20/2014  . Dupuytren's contracture 08/20/2014  . Cough 07/21/2014  . Lumbar stenosis with neurogenic claudication 06/13/2014  . Preop cardiovascular exam 05/28/2014  . S/P deep brain stimulator placement 05/08/2014  . Joint pain 01/22/2014  . Medicare annual wellness visit, initial 01/19/2014  . Advance care planning 01/19/2014  . Parkinson's disease ([place]) 12/13/2013  . PTSD (post-traumatic stress disorder) 06/13/2013  . Erectile dysfunction 06/13/2013  . HLD (hyperlipidemia) 06/13/2013  . Hip pain 06/10/2013  . Pain in joint, shoulder region 06/10/2013  . Right leg swelling 06/03/2013  . Essential hypertension 06/03/2013  . Bradycardia by electrocardiogram 06/03/2013  . Obstructive sleep apnea 03/12/2013  . Spinal stenosis, lumbar region, with neurogenic claudication 12/14/2012    Harrel Carina, MS, OTR/L 08/21/2017, 9:32 AM  [place] MAIN [facility] SERVICES [address] [place], [place], [address] Phone: [phone]   Fax:  [phone]  Name: [name] MRN: [id number] Date of Birth: 08/24/1950

## 2017-08-22 ENCOUNTER — Ambulatory Visit (INDEPENDENT_AMBULATORY_CARE_PROVIDER_SITE_OTHER): Payer: Medicare PPO | Admitting: Psychology

## 2017-08-22 DIAGNOSIS — F4323 Adjustment disorder with mixed anxiety and depressed mood: Secondary | ICD-10-CM | POA: Diagnosis not present

## 2017-08-22 DIAGNOSIS — F331 Major depressive disorder, recurrent, moderate: Secondary | ICD-10-CM

## 2017-08-23 ENCOUNTER — Ambulatory Visit: Payer: Medicare PPO | Admitting: Occupational Therapy

## 2017-08-24 ENCOUNTER — Ambulatory Visit: Payer: Self-pay | Admitting: Psychology

## 2017-08-28 ENCOUNTER — Ambulatory Visit: Payer: Medicare PPO | Admitting: Occupational Therapy

## 2017-08-28 ENCOUNTER — Other Ambulatory Visit: Payer: Self-pay

## 2017-08-28 ENCOUNTER — Encounter: Payer: Self-pay | Admitting: Occupational Therapy

## 2017-08-28 DIAGNOSIS — R296 Repeated falls: Secondary | ICD-10-CM | POA: Diagnosis not present

## 2017-08-28 DIAGNOSIS — R279 Unspecified lack of coordination: Secondary | ICD-10-CM | POA: Diagnosis not present

## 2017-08-28 DIAGNOSIS — R278 Other lack of coordination: Secondary | ICD-10-CM | POA: Diagnosis not present

## 2017-08-28 DIAGNOSIS — R2689 Other abnormalities of gait and mobility: Secondary | ICD-10-CM | POA: Diagnosis not present

## 2017-08-28 DIAGNOSIS — M6281 Muscle weakness (generalized): Secondary | ICD-10-CM | POA: Diagnosis not present

## 2017-08-28 DIAGNOSIS — R2681 Unsteadiness on feet: Secondary | ICD-10-CM | POA: Diagnosis not present

## 2017-08-28 DIAGNOSIS — R293 Abnormal posture: Secondary | ICD-10-CM | POA: Diagnosis not present

## 2017-08-28 NOTE — Therapy (Signed)
[place] MAIN [facility] SERVICES [address] [place], [place], [address] Phone: [phone]   Fax:  [phone]  Occupational Therapy Treatment  Patient Details  Name: [name] MRN: [id number] Date of Birth: 26-Oct-1950 Referring Provider: Wells Guiles Tat   Encounter Date: 08/28/2017  OT End of Session - 08/28/17 0938    Visit Number  6    Number of Visits  24    Date for OT Re-Evaluation  10/23/17    Authorization Type  Visit 6 of 10 for progress reporting period starting 07/31/2017    OT Start Time  0930    OT Stop Time  1015    OT Time Calculation (min)  45 min    Activity Tolerance  Patient tolerated treatment well    Behavior During Therapy  [facility] for tasks assessed/performed       Past Medical History:  Diagnosis Date  . Arthritis   . Bradycardia   . Cancer [facility]) 2013   skin cancer  . Depression    ptsd  . Dysrhythmia    chronic slow heart rate  . GERD (gastroesophageal reflux disease)   . Headache(784.0)    tension headaches non recent  . History of chicken pox   . History of kidney stones    passed  . Hypertension    treated with HCTZ  . Parkinson's disease ([place])    dx'ed 15 years ago  . PTSD (post-traumatic stress disorder)   . Shortness of breath dyspnea   . Sleep apnea    doesn't use C-pap  . Varicose veins     Past Surgical History:  Procedure Laterality Date  . CHOLECYSTECTOMY N/A 10/22/2014   Procedure: LAPAROSCOPIC CHOLECYSTECTOMY WITH INTRAOPERATIVE CHOLANGIOGRAM;  Surgeon: Dia Crawford III, MD;  Location: [facility] ORS;  Service: General;  Laterality: N/A;  . cyst removed      from lip as a child  . LUMBAR LAMINECTOMY/DECOMPRESSION MICRODISCECTOMY Bilateral 12/14/2012   Procedure: Bilateral lumbar three-four, four-five decompressive laminotomy/foraminotomy;  Surgeon: Charlie Pitter, MD;  Location: [place] NEURO ORS;  Service: Neurosurgery;  Laterality: Bilateral;  . PULSE GENERATOR IMPLANT Bilateral 12/13/2013    Procedure: Bilateral implantable pulse generator placement;  Surgeon: Erline Levine, MD;  Location: [place] NEURO ORS;  Service: Neurosurgery;  Laterality: Bilateral;  Bilateral implantable pulse generator placement  . skin cancer removed     from ears,   12 lft arm  rt leg 15  . SUBTHALAMIC STIMULATOR BATTERY REPLACEMENT Bilateral 07/14/2017   Procedure: BILATERAL IMPLANTED PULSE GENERATOR CHANGE FOR DEEP BRAIN STIMULATOR;  Surgeon: Erline Levine, MD;  Location: [place];  Service: Neurosurgery;  Laterality: Bilateral;  . SUBTHALAMIC STIMULATOR INSERTION Bilateral 12/06/2013   Procedure: SUBTHALAMIC STIMULATOR INSERTION;  Surgeon: Erline Levine, MD;  Location: [place] NEURO ORS;  Service: Neurosurgery;  Laterality: Bilateral;  Bilateral deep brain stimulator placement    There were no vitals filed for this visit.  Subjective Assessment - 08/28/17 0937    Subjective   pt. reports no further falls.    Patient is accompained by:  Family member    Pertinent History  Pt. is a 67 y.o. mlae who has a history of Parkinson's Disease. Pt. has a Deep Brain Stimulator in place. Pt. has a history of Low Back Pain with 2 back surgeries in August 2014, and February 2016. Pt. has a history of multiple frequent falls occurring at a rate of 6x's a week.     Limitations  Frequent falls, limited motor control, and  [facility]     Patient Stated Goals  To return home.    Currently in Pain?  No/denies       OT TREATMENT    Neuro muscular re-education:  Pt. performed [facility] skills training to improve speed and dexterity needed for ADL tasks and writing. Pt. worked on  grasping 1 inch sticks,  inch cylindrical collars, and  inch flat washers on the Purdue pegboard. Pt. performed grasping the sticks with his 2nd digit and thumb, and storing them in the palm. Pt. presented with difficulty storing  inch objects at a time in the palmar aspect of the hand, and difficulty with the translatory movements of the hand dropping mulitple sticks. Pt.  Worked on alternating thumb opposition to the tip of his thumb to the tip of his 2nd digit. Pt. worked on alternating bilateral hand movements.  Therapeutic Exercise:  Pt. Worked on Retail banker using Putty[employer] tools with green theraband using the knob turn and key turn attachments to target specific components of movements in the hand. Pt. Worked with the knobturn attachments to work to focus on standard gripping, and weightbearing push. Pt. Worked on using the key turn attachment for standard turning, and lateral pinch turn.                         OT Education - 08/28/17 [phone]    Education provided  Yes    Education Details  UE ther. ex.    Person(s) Educated  Patient    Methods  Explanation;Demonstration;Verbal cues    Comprehension  Verbalized understanding;Returned demonstration          OT Long Term Goals - 07/31/17 1122      OT LONG TERM GOAL #1   Title  Pt. will improve Bilateral UE strength by 1 mm grade to assist with ADLs, and IADLs    Baseline  EVal: BUE strength: 4+/5 overall    Time  12    Period  Weeks    Status  On-going    Target Date  10/23/17      OT LONG TERM GOAL #2   Title  Pt. will improve grip strength to be able to pour himself a beverage.    Baseline  Eval: Pt. is unable to hold and pour a beevrage.    Time  12    Period  Weeks    Status  New    Target Date  10/23/17      OT LONG TERM GOAL #3   Title  Pt. will improve lateral pinch stength to be able to independently turn a key.     Baseline  Eval: Pt. has difficulty holding, and turning  key.    Time  12    Period  Weeks    Status  New    Target Date  10/23/17      OT LONG TERM GOAL #4   Title  Pt. will improve Right hand [facility] skills to be able to independently grasp and use small objects needed for fixing car parts.    Baseline  Eval: Right hand 1 min. & 5 sec. on the 9 hole peg test.    Time  12    Period  Weeks    Status  New    Target Date  10/23/17      OT  LONG TERM GOAL #5   Title  Pt. will be demonstarte self-feeding with modified independence with minimal spillage.  Baseline  Eval: Pt. requires assistance, and has alot of spillage.    Time  12    Period  Weeks    Status  On-going    Target Date  10/23/17      Long Term Additional Goals   Additional Long Term Goals  --      OT LONG TERM GOAL #6   Title  Pt. will manipulate buttons, and cuff buttons independently.    Baseline  Eval: Pt. has difficulty     Time  12    Target Date  10/23/17      OT LONG TERM GOAL #7   Title  Pt. will write 1 sentence efficiently with 90% legibility.    Baseline  Eval: mname only 50% legibility    Time  12    Period  Weeks    Status  New    Target Date  10/23/17            Plan - 08/28/17 0939    Clinical Impression Statement Pt. reprots missing the last session due to a headache. Pt. continues to present with limited UE strength and coordination skills. Pt. continues to present with limited UE strength, and coordination skills, and continues to work on improving UE stregnt, coordination, and hand function skills for improved functional use during ADLs, and IADLs.     Occupational performance deficits (Please refer to evaluation for details):  ADL's;IADL's    Rehab Potential  Good    Current Impairments/barriers affecting progress:  Positive Indicators: age, familiy support, motivation. Negative Indicators: Multiple comorbidities, and history of falls.    OT Frequency  2x / week    OT Duration  12 weeks    OT Treatment/Interventions  Self-care/ADL training;Therapeutic exercise;DME and/or AE instruction;Energy conservation;Neuromuscular education;Patient/family education;Therapeutic activities    Clinical Decision Making  Several treatment options, min-mod task modification necessary    Consulted and Agree with Plan of Care  Patient       Patient will benefit from skilled therapeutic intervention in order to improve the following deficits  and impairments:  Pain, Decreased strength, Decreased coordination, Decreased balance, Impaired UE functional use, Decreased safety awareness, Decreased activity tolerance, Decreased cognition, Impaired flexibility, Impaired vision/preception  Visit Diagnosis: Muscle weakness (generalized)  Other lack of coordination    Problem List Patient Active Problem List   Diagnosis Date Noted  . 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 Radicular pain in right arm 10/09/2016  . Rash 09/03/2016  . GERD (gastroesophageal reflux disease) 07/31/2016  . Trochanteric bursitis 03/03/2016  . Left hand pain 10/30/2015  . Fracture, finger, multiple sites 10/30/2015  . Colon cancer screening 07/23/2015  . Healthcare maintenance 07/23/2015  . Gout 02/18/2015  . Depression 01/06/2015  . Irritation of eyelid 08/20/2014  . Dupuytren's contracture 08/20/2014  . Cough 07/21/2014  . Lumbar stenosis with neurogenic claudication 06/13/2014  . Preop cardiovascular exam 05/28/2014  . S/P deep brain stimulator placement 05/08/2014  . Joint pain 01/22/2014  . Medicare annual wellness visit, initial 01/19/2014  . Advance care planning 01/19/2014  . Parkinson's disease ([place]) 12/13/2013  . PTSD (post-traumatic stress disorder) 06/13/2013  . Erectile dysfunction 06/13/2013  . HLD (hyperlipidemia) 06/13/2013  . Hip pain 06/10/2013  . Pain in joint, shoulder region 06/10/2013  . Right leg swelling 06/03/2013  . Essential hypertension 06/03/2013  . Bradycardia by electrocardiogram 06/03/2013  . Obstructive sleep apnea 03/12/2013  . Spinal stenosis, lumbar region, with neurogenic claudication 12/14/2012    Harrel Carina, MS, OTR/L 08/28/2017,  9:50 AM  [place] MAIN [facility] SERVICES [address] [place], [place], [address] Phone: [phone]   Fax:  [phone]  Name: [name] MRN: [id number] Date of Birth: [birth year]

## 2017-08-30 ENCOUNTER — Ambulatory Visit: Payer: Medicare PPO | Admitting: Occupational Therapy

## 2017-08-30 ENCOUNTER — Encounter: Payer: Self-pay | Admitting: Occupational Therapy

## 2017-08-30 ENCOUNTER — Ambulatory Visit: Payer: Medicare PPO

## 2017-08-30 DIAGNOSIS — R2689 Other abnormalities of gait and mobility: Secondary | ICD-10-CM | POA: Diagnosis not present

## 2017-08-30 DIAGNOSIS — M6281 Muscle weakness (generalized): Secondary | ICD-10-CM | POA: Diagnosis not present

## 2017-08-30 DIAGNOSIS — R279 Unspecified lack of coordination: Secondary | ICD-10-CM | POA: Diagnosis not present

## 2017-08-30 DIAGNOSIS — R2681 Unsteadiness on feet: Secondary | ICD-10-CM | POA: Diagnosis not present

## 2017-08-30 DIAGNOSIS — R278 Other lack of coordination: Secondary | ICD-10-CM

## 2017-08-30 DIAGNOSIS — R296 Repeated falls: Secondary | ICD-10-CM | POA: Diagnosis not present

## 2017-08-30 DIAGNOSIS — R293 Abnormal posture: Secondary | ICD-10-CM | POA: Diagnosis not present

## 2017-08-30 NOTE — Therapy (Signed)
[place] MAIN [facility] SERVICES [address] [place], [place], [address] Phone: [phone]   Fax:  [phone]  Physical Therapy Treatment  Patient Details  Name: [name] MRN: [id number] Date of Birth: 1950/04/23 Referring Provider: Wells Guiles Tat   Encounter Date: 08/30/2017  PT End of Session - 08/30/17 0855    Visit Number  34    Number of Visits  58    Date for PT Re-Evaluation  09/13/17    Authorization Type  6/10 of progress note    PT Start Time  0845    PT Stop Time  0929    PT Time Calculation (min)  44 min    Equipment Utilized During Treatment  Gait belt    Activity Tolerance  Patient tolerated treatment well;Treatment limited secondary to medical complications (Comment)    Behavior During Therapy  [facility] for tasks assessed/performed       Past Medical History:  Diagnosis Date  . Arthritis   . Bradycardia   . Cancer [facility]) 2013   skin cancer  . Depression    ptsd  . Dysrhythmia    chronic slow heart rate  . GERD (gastroesophageal reflux disease)   . Headache(784.0)    tension headaches non recent  . History of chicken pox   . History of kidney stones    passed  . Hypertension    treated with HCTZ  . Parkinson's disease ([place])    dx'ed 15 years ago  . PTSD (post-traumatic stress disorder)   . Shortness of breath dyspnea   . Sleep apnea    doesn't use C-pap  . Varicose veins     Past Surgical History:  Procedure Laterality Date  . CHOLECYSTECTOMY N/A 10/22/2014   Procedure: LAPAROSCOPIC CHOLECYSTECTOMY WITH INTRAOPERATIVE CHOLANGIOGRAM;  Surgeon: Dia Crawford III, MD;  Location: [facility] ORS;  Service: General;  Laterality: N/A;  . cyst removed      from lip as a child  . LUMBAR LAMINECTOMY/DECOMPRESSION MICRODISCECTOMY Bilateral 12/14/2012   Procedure: Bilateral lumbar three-four, four-five decompressive laminotomy/foraminotomy;  Surgeon: Charlie Pitter, MD;  Location: [place] NEURO ORS;  Service: Neurosurgery;  Laterality:  Bilateral;  . PULSE GENERATOR IMPLANT Bilateral 12/13/2013   Procedure: Bilateral implantable pulse generator placement;  Surgeon: Erline Levine, MD;  Location: [place] NEURO ORS;  Service: Neurosurgery;  Laterality: Bilateral;  Bilateral implantable pulse generator placement  . skin cancer removed     from ears,   12 lft arm  rt leg 15  . SUBTHALAMIC STIMULATOR BATTERY REPLACEMENT Bilateral 07/14/2017   Procedure: BILATERAL IMPLANTED PULSE GENERATOR CHANGE FOR DEEP BRAIN STIMULATOR;  Surgeon: Erline Levine, MD;  Location: [place];  Service: Neurosurgery;  Laterality: Bilateral;  . SUBTHALAMIC STIMULATOR INSERTION Bilateral 12/06/2013   Procedure: SUBTHALAMIC STIMULATOR INSERTION;  Surgeon: Erline Levine, MD;  Location: [place] NEURO ORS;  Service: Neurosurgery;  Laterality: Bilateral;  Bilateral deep brain stimulator placement    There were no vitals filed for this visit.  Subjective Assessment - 08/30/17 0852    Subjective  Patient reports having pain when weight bearing over RLE. Reports that he may of fell over the weekend but can't remember.     Pertinent History  Patient referred for physical therapy due to wife's request for patient to get aquatic therapy to help with his conditioning. Pt is also complaining of chronic low back and R hip pain and frequent falls. 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 The last time he was in the water he was very uncomfortable because he didn't feel like he had any control. Pt has a history of 2 low back surgeries in August 2014 and February 2016. Pt and wife are unable to recall exactly what was performed but they know that he had stenosis. Surgeries were performed by Dr. Annette Stable in [place]. Pt reports that his low back and R hip pain are constant. He describes the pain in the back as "dull" and the pain in the hip as "sharp." For both back and hip worst pain is 10/10, Best: 2/10, Present: 2/10. No particular episode  of trauma to his low back or hip preceding the pain. Pt has a history of Parkinson's with a deep brain stimulator. He ambulates with a 4 wheeled walker called a U-step (automatic brakes, audio cues for stepping). He states that he falls frequently and reports that he has had approximately 40-50 falls in the last 6 months. No specific recent changes in his health.     Limitations  Walking;Standing;Sitting    How long can you sit comfortably?  30 minutes    How long can you stand comfortably?  5 minutes    How long can you walk comfortably?  10 minutes    Patient Stated Goals  Improve comfort in the water, improve balance, decrease back and R hip pain    Currently in Pain?  Yes    Pain Score  3     Pain Location  Hip    Pain Orientation  Right    Pain Descriptors / Indicators  Aching    Pain Type  Chronic pain    Pain Onset  In the past 7 days    Pain Frequency  Intermittent    Aggravating Factors   weight bearing       Nustep Lvl 4 3 minutes   SPT from WC to Nustep required 2 attempts with PT requiring patient to stop count out loud to 5 before retrying to to patient becoming agitated and beginning to flail.       5lb ankle weight standing   marching  2x10x BUE support  Hip flexion 2x10 BUE support  Hip extension 2x10 BUE support      6" step toe taps 20x each leg: cues for where to place foot and frequency of taps with verbalization of 1 and 2 for timing. Require metranome for 40bpm.    6" side toe taps 20x each leg. BUE support.      Walking in // bars  With wooden dowel between feet to increase BOS with cues for speed of steps with PT calling 1,2,3,4 : improved gait mechanics and heel strike.    Airex pad balance 3x30 seconds; 1x static, 1x horizontal head turns, 1x vertical head turns    Sit to stand from chair in // bars  With emphasis on forward weight shift rather than backwards trunk LOB to improve safety with transfers. ; 15x with cues for nose over toes.   Seated  abduction RTB 10x    Pt. response to medical necessity:   Patient will continue to benefit from skilled physical therapy to improve static and dynamic standing balance and decrease fall risk for improved functional capacity of activities                        PT Education - 08/30/17 0853    Education provided  Yes  Education Details  exercise technique,     Person(s) Educated  Patient    Methods  Explanation;Demonstration;Verbal cues    Comprehension  Verbalized understanding;Returned demonstration       PT Short Term Goals - 07/19/17 1741      PT SHORT TERM GOAL #1   Title  Pt will perform HEP with family's supervision, for improved balance, transfers, and gait.      Baseline  patient reports compliance, however not every day    Time  4    Period  Weeks    Status  Partially Met      PT SHORT TERM GOAL #2   Title  Pt will perform 5x sit<>stand in less than or equal to 15 seconds for improved efficiency and safety with transfers.    Baseline  19 seconds; deffered at this time due to excessive LOB     Time  2    Period  Weeks    Status  On-going        PT Long Term Goals - 07/19/17 1742      PT LONG TERM GOAL #1   Title  Pt will decrease 5TSTS by at least 3 seconds in order to demonstrate clinically significant improvement in LE strength     Baseline  02/08/17: 20.1 seconds 12/18: 17 seconds ;    Time  8    Period  Weeks    Status  Achieved      PT LONG TERM GOAL #2   Title  Pt will decrease TUG by at least 3.4s in order to demonstrate decreased fall risk     Baseline  02/08/17: 22.1s 12/18: 20 seconds 2/7: 19 seconds; 4/3: 23 seconds with walker    Time  8    Period  Weeks    Status  Partially Met    Target Date  09/13/17      PT LONG TERM GOAL #3   Title  Pt will improve BERG by at least 3 points in order to demonstrate clinically significant improvement in balance.    Baseline  02/08/17: 30/56 12/18: 38/56      Time  8    Period  Weeks     Status  Achieved      PT LONG TERM GOAL #4   Title  Pt/wife will verbalize understanding of plans for continued community fitness upon D/C from PT including aquatic therapy program    Baseline  02/08/17: pt currently reports being more comfortable in the water    Time  8    Period  Weeks    Status  Partially Met    Target Date  09/13/17      PT LONG TERM GOAL #5   Title  Pt will decrease mODI scoreby at least 13 points in order demonstrate clinically significant reduction in pain/disability     Baseline  02/08/17: 54% 12/18: 46% 2/7: 42% 4/3; 50%    Time  8    Period  Weeks    Status  Partially Met    Target Date  09/13/17      PT LONG TERM GOAL #6   Title  Pt will improve BERG to 44/56  in order to demonstrate clinically significant improvement in balance for decreased fall risk    Baseline  12/18: 38/56 2/7: 41/56 4/3: 41/56    Time  8    Period  Weeks    Status  Partially Met    Target Date  09/13/17  Plan - 08/30/17 0909    Clinical Impression Statement  Patient requires less frequent cueing for widening BOS as well as demonstrating improved ability to perform toe taps with less cueing for velocity of movement resulting in decreased scuffling of feet.   Patient will continue to benefit from skilled physical therapy to improve static and dynamic standing balance and decrease fall risk for improved functional capacity of activities    Rehab Potential  Fair    PT Frequency  2x / week    PT Duration  8 weeks    PT Treatment/Interventions  ADLs/Self Care Home Management;Aquatic Therapy;Cryotherapy;Electrical Stimulation;Iontophoresis '4mg'$ /ml Dexamethasone;Moist Heat;Ultrasound;Contrast Bath;DME Instruction;Gait training;Stair training;Functional mobility training;Therapeutic activities;Therapeutic exercise;Balance training;Neuromuscular re-education;Patient/family education;Manual techniques;Wheelchair mobility training;Energy conservation;Passive range of motion     PT Next Visit Plan  progress balance, strength, aquatic therapy    PT Home Exercise Plan  None provided at this time until further assessed, wife reports pt does not perform current HEP from [facility] PT    Consulted and Agree with Plan of Care  Patient;Family member/caregiver    Family Member Consulted  wife       Patient will benefit from skilled therapeutic intervention in order to improve the following deficits and impairments:  Abnormal gait, Decreased balance, Decreased coordination, Decreased mobility, Impaired tone, Postural dysfunction  Visit Diagnosis: Muscle weakness (generalized)  Other lack of coordination  Repeated falls  Other abnormalities of gait and mobility     Problem List Patient Active Problem List   Diagnosis Date Noted  . 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 Radicular pain in right arm 10/09/2016  . Rash 09/03/2016  . GERD (gastroesophageal reflux disease) 07/31/2016  . Trochanteric bursitis 03/03/2016  . Left hand pain 10/30/2015  . Fracture, finger, multiple sites 10/30/2015  . Colon cancer screening 07/23/2015  . Healthcare maintenance 07/23/2015  . Gout 02/18/2015  . Depression 01/06/2015  . Irritation of eyelid 08/20/2014  . Dupuytren's contracture 08/20/2014  . Cough 07/21/2014  . Lumbar stenosis with neurogenic claudication 06/13/2014  . Preop cardiovascular exam 05/28/2014  . S/P deep brain stimulator placement 05/08/2014  . Joint pain 01/22/2014  . Medicare annual wellness visit, initial 01/19/2014  . Advance care planning 01/19/2014  . Parkinson's disease ([place]) 12/13/2013  . PTSD (post-traumatic stress disorder) 06/13/2013  . Erectile dysfunction 06/13/2013  . HLD (hyperlipidemia) 06/13/2013  . Hip pain 06/10/2013  . Pain in joint, shoulder region 06/10/2013  . Right leg swelling 06/03/2013  . Essential hypertension 06/03/2013  . Bradycardia by electrocardiogram 06/03/2013  . Obstructive sleep apnea 03/12/2013  . Spinal stenosis,  lumbar region, with neurogenic claudication 12/14/2012   Janna Arch, PT, DPT   08/30/2017, 9:30 AM  [place] MAIN [facility] SERVICES [address] [place], [place], [address] Phone: [phone]   Fax:  [phone]  Name: [name] MRN: [id number] Date of Birth: Nov 21, 1950

## 2017-08-30 NOTE — Therapy (Signed)
[place] MAIN [facility] SERVICES [address] [place], [place], [address] Phone: [phone]   Fax:  [phone]  Occupational Therapy Treatment  Patient Details  Name: [name] MRN: [id number] Date of Birth: 1951/01/29 Referring Provider: Wells Guiles Tat   Encounter Date: 08/30/2017  OT End of Session - 08/30/17 0939    Visit Number  7    Number of Visits  24    Date for OT Re-Evaluation  10/23/17    Authorization Type  Visit 7 of 10 for progress reporting period starting 07/31/2017    OT Start Time  0930    OT Stop Time  1015    OT Time Calculation (min)  45 min    Activity Tolerance  Patient tolerated treatment well    Behavior During Therapy  [facility] for tasks assessed/performed       Past Medical History:  Diagnosis Date  . Arthritis   . Bradycardia   . Cancer [facility]) 2013   skin cancer  . Depression    ptsd  . Dysrhythmia    chronic slow heart rate  . GERD (gastroesophageal reflux disease)   . Headache(784.0)    tension headaches non recent  . History of chicken pox   . History of kidney stones    passed  . Hypertension    treated with HCTZ  . Parkinson's disease ([place])    dx'ed 15 years ago  . PTSD (post-traumatic stress disorder)   . Shortness of breath dyspnea   . Sleep apnea    doesn't use C-pap  . Varicose veins     Past Surgical History:  Procedure Laterality Date  . CHOLECYSTECTOMY N/A 10/22/2014   Procedure: LAPAROSCOPIC CHOLECYSTECTOMY WITH INTRAOPERATIVE CHOLANGIOGRAM;  Surgeon: Dia Crawford III, MD;  Location: [facility] ORS;  Service: General;  Laterality: N/A;  . cyst removed      from lip as a child  . LUMBAR LAMINECTOMY/DECOMPRESSION MICRODISCECTOMY Bilateral 12/14/2012   Procedure: Bilateral lumbar three-four, four-five decompressive laminotomy/foraminotomy;  Surgeon: Charlie Pitter, MD;  Location: [place] NEURO ORS;  Service: Neurosurgery;  Laterality: Bilateral;  . PULSE GENERATOR IMPLANT Bilateral 12/13/2013   Procedure: Bilateral implantable pulse generator placement;  Surgeon: Erline Levine, MD;  Location: [place] NEURO ORS;  Service: Neurosurgery;  Laterality: Bilateral;  Bilateral implantable pulse generator placement  . skin cancer removed     from ears,   12 lft arm  rt leg 15  . SUBTHALAMIC STIMULATOR BATTERY REPLACEMENT Bilateral 07/14/2017   Procedure: BILATERAL IMPLANTED PULSE GENERATOR CHANGE FOR DEEP BRAIN STIMULATOR;  Surgeon: Erline Levine, MD;  Location: [place];  Service: Neurosurgery;  Laterality: Bilateral;  . SUBTHALAMIC STIMULATOR INSERTION Bilateral 12/06/2013   Procedure: SUBTHALAMIC STIMULATOR INSERTION;  Surgeon: Erline Levine, MD;  Location: [place] NEURO ORS;  Service: Neurosurgery;  Laterality: Bilateral;  Bilateral deep brain stimulator placement    There were no vitals filed for this visit.   OT TREATMENT    Neuro muscular re-education:  Pt. worked on grasping, flipping and stacking 2" large pegs on the Instructo board placed at a tabletop surface. Pt. Required verbal cues, and visual demonstration for movement pattern, and hand position. Pt. Worked on grasping one inch resistive cubes alternating thumb opposition to the tip of the 2nd through 5th digits. The board was positioned at a vertical angle. Pt. worked on pressing them back into place while isolating 2nd through 5th digits. Pt. required verbal cues for each digit. Initially. Pt. worked on tasks to sustain lateral pinch  on resistive tweezers while grasping and moving 2" toothpick sticks from a horizontal flat position to a vertical position in order to place it in the holder. Pt. was able to sustain grasp while positioning and extending the wrist/hand in the necessary alignment needed to place the stick through the top of the holder. Pt. Presented with proper form after cues for right hand position, and movement pattern.                          OT Education - 08/30/17 [phone]    Education provided  Yes     Education Details  [place], UE ther. ex    Person(s) Educated  Patient    Methods  Explanation;Demonstration;Verbal cues    Comprehension  Verbalized understanding;Returned demonstration          OT Long Term Goals - 07/31/17 1122      OT LONG TERM GOAL #1   Title  Pt. will improve Bilateral UE strength by 1 mm grade to assist with ADLs, and IADLs    Baseline  EVal: BUE strength: 4+/5 overall    Time  12    Period  Weeks    Status  On-going    Target Date  10/23/17      OT LONG TERM GOAL #2   Title  Pt. will improve grip strength to be able to pour himself a beverage.    Baseline  Eval: Pt. is unable to hold and pour a beevrage.    Time  12    Period  Weeks    Status  New    Target Date  10/23/17      OT LONG TERM GOAL #3   Title  Pt. will improve lateral pinch stength to be able to independently turn a key.     Baseline  Eval: Pt. has difficulty holding, and turning  key.    Time  12    Period  Weeks    Status  New    Target Date  10/23/17      OT LONG TERM GOAL #4   Title  Pt. will improve Right hand [facility] skills to be able to independently grasp and use small objects needed for fixing car parts.    Baseline  Eval: Right hand 1 min. & 5 sec. on the 9 hole peg test.    Time  12    Period  Weeks    Status  New    Target Date  10/23/17      OT LONG TERM GOAL #5   Title  Pt. will be demonstarte self-feeding with modified independence with minimal spillage.    Baseline  Eval: Pt. requires assistance, and has alot of spillage.    Time  12    Period  Weeks    Status  On-going    Target Date  10/23/17      Long Term Additional Goals   Additional Long Term Goals  --      OT LONG TERM GOAL #6   Title  Pt. will manipulate buttons, and cuff buttons independently.    Baseline  Eval: Pt. has difficulty     Time  12    Target Date  10/23/17      OT LONG TERM GOAL #7   Title  Pt. will write 1 sentence efficiently with 90% legibility.    Baseline  Eval: mname only 50%  legibility    Time  12  Period  Weeks    Status  New    Target Date  10/23/17            Plan - 08/30/17 0940    Clinical Impression Statement  Pt. reports that he fell against his door yesterday. Pt. continues to present with limited RUE hand strength, and [facility] skills. Pt. continues to work on motor control, and coordination skills needed for grasping, and manipulating objects. Pt. continues to work on improving UE strength, and coordination skills. for improved engagement in ADL, and IADL tasks.     Occupational performance deficits (Please refer to evaluation for details):  ADL's;IADL's    Rehab Potential  Good    Current Impairments/barriers affecting progress:  Positive Indicators: age, familiy support, motivation. Negative Indicators: Multiple comorbidities, and history of falls.    OT Frequency  2x / week    OT Duration  12 weeks    OT Treatment/Interventions  Self-care/ADL training;Therapeutic exercise;DME and/or AE instruction;Energy conservation;Neuromuscular education;Patient/family education;Therapeutic activities    Clinical Decision Making  Several treatment options, min-mod task modification necessary    Consulted and Agree with Plan of Care  Patient       Patient will benefit from skilled therapeutic intervention in order to improve the following deficits and impairments:  Pain, Decreased strength, Decreased coordination, Decreased balance, Impaired UE functional use, Decreased safety awareness, Decreased activity tolerance, Decreased cognition, Impaired flexibility, Impaired vision/preception  Visit Diagnosis: Muscle weakness (generalized)  Other lack of coordination    Problem List Patient Active Problem List   Diagnosis Date Noted  . 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 Radicular pain in right arm 10/09/2016  . Rash 09/03/2016  . GERD (gastroesophageal reflux disease) 07/31/2016  . Trochanteric bursitis 03/03/2016  . Left hand pain 10/30/2015  .  Fracture, finger, multiple sites 10/30/2015  . Colon cancer screening 07/23/2015  . Healthcare maintenance 07/23/2015  . Gout 02/18/2015  . Depression 01/06/2015  . Irritation of eyelid 08/20/2014  . Dupuytren's contracture 08/20/2014  . Cough 07/21/2014  . Lumbar stenosis with neurogenic claudication 06/13/2014  . Preop cardiovascular exam 05/28/2014  . S/P deep brain stimulator placement 05/08/2014  . Joint pain 01/22/2014  . Medicare annual wellness visit, initial 01/19/2014  . Advance care planning 01/19/2014  . Parkinson's disease ([place]) 12/13/2013  . PTSD (post-traumatic stress disorder) 06/13/2013  . Erectile dysfunction 06/13/2013  . HLD (hyperlipidemia) 06/13/2013  . Hip pain 06/10/2013  . Pain in joint, shoulder region 06/10/2013  . Right leg swelling 06/03/2013  . Essential hypertension 06/03/2013  . Bradycardia by electrocardiogram 06/03/2013  . Obstructive sleep apnea 03/12/2013  . Spinal stenosis, lumbar region, with neurogenic claudication 12/14/2012    Harrel Carina, MS, OTR/L 08/30/2017, 9:56 AM  [place] MAIN [facility] SERVICES [address] [place], [place], [address] Phone: [phone]   Fax:  [phone]  Name: [name] MRN: [id number] Date of Birth: 07/16/1950

## 2017-09-01 ENCOUNTER — Other Ambulatory Visit: Payer: Self-pay | Admitting: Family Medicine

## 2017-09-01 DIAGNOSIS — G4733 Obstructive sleep apnea (adult) (pediatric): Secondary | ICD-10-CM | POA: Diagnosis not present

## 2017-09-01 DIAGNOSIS — G20 Parkinson's disease: Secondary | ICD-10-CM | POA: Diagnosis not present

## 2017-09-05 ENCOUNTER — Ambulatory Visit (INDEPENDENT_AMBULATORY_CARE_PROVIDER_SITE_OTHER): Payer: Medicare PPO | Admitting: Psychology

## 2017-09-05 DIAGNOSIS — F4323 Adjustment disorder with mixed anxiety and depressed mood: Secondary | ICD-10-CM | POA: Diagnosis not present

## 2017-09-05 DIAGNOSIS — F331 Major depressive disorder, recurrent, moderate: Secondary | ICD-10-CM | POA: Diagnosis not present

## 2017-09-06 ENCOUNTER — Encounter: Payer: Self-pay | Admitting: Occupational Therapy

## 2017-09-06 ENCOUNTER — Ambulatory Visit: Payer: Medicare PPO

## 2017-09-06 ENCOUNTER — Ambulatory Visit: Payer: Medicare PPO | Admitting: Occupational Therapy

## 2017-09-06 VITALS — BP 132/72 | HR 44

## 2017-09-06 DIAGNOSIS — M6281 Muscle weakness (generalized): Secondary | ICD-10-CM

## 2017-09-06 DIAGNOSIS — R2689 Other abnormalities of gait and mobility: Secondary | ICD-10-CM

## 2017-09-06 DIAGNOSIS — R293 Abnormal posture: Secondary | ICD-10-CM | POA: Diagnosis not present

## 2017-09-06 DIAGNOSIS — R296 Repeated falls: Secondary | ICD-10-CM | POA: Diagnosis not present

## 2017-09-06 DIAGNOSIS — R278 Other lack of coordination: Secondary | ICD-10-CM

## 2017-09-06 DIAGNOSIS — R2681 Unsteadiness on feet: Secondary | ICD-10-CM | POA: Diagnosis not present

## 2017-09-06 DIAGNOSIS — R279 Unspecified lack of coordination: Secondary | ICD-10-CM | POA: Diagnosis not present

## 2017-09-06 NOTE — Therapy (Signed)
[place] MAIN [facility] SERVICES [address] [place], [place], [address] Phone: [phone]   Fax:  [phone]  Physical Therapy Treatment  Patient Details  Name: [name] MRN: [id number] Date of Birth: [birth year] Referring Provider: Wells Guiles Tat   Encounter Date: 09/06/2017  PT End of Session - 09/06/17 0813    Visit Number  35    Number of Visits  49    Date for PT Re-Evaluation  09/13/17    Authorization Type  7/10 of progress note    PT Start Time  0803    PT Stop Time  0845    PT Time Calculation (min)  42 min    Equipment Utilized During Treatment  Gait belt    Activity Tolerance  Patient tolerated treatment well;Treatment limited secondary to medical complications (Comment)    Behavior During Therapy  [facility] for tasks assessed/performed       Past Medical History:  Diagnosis Date  . Arthritis   . Bradycardia   . Cancer [facility]) 2013   skin cancer  . Depression    ptsd  . Dysrhythmia    chronic slow heart rate  . GERD (gastroesophageal reflux disease)   . Headache(784.0)    tension headaches non recent  . History of chicken pox   . History of kidney stones    passed  . Hypertension    treated with HCTZ  . Parkinson's disease ([place])    dx'ed 15 years ago  . PTSD (post-traumatic stress disorder)   . Shortness of breath dyspnea   . Sleep apnea    doesn't use C-pap  . Varicose veins     Past Surgical History:  Procedure Laterality Date  . CHOLECYSTECTOMY N/A 10/22/2014   Procedure: LAPAROSCOPIC CHOLECYSTECTOMY WITH INTRAOPERATIVE CHOLANGIOGRAM;  Surgeon: Dia Crawford III, MD;  Location: [facility] ORS;  Service: General;  Laterality: N/A;  . cyst removed      from lip as a child  . LUMBAR LAMINECTOMY/DECOMPRESSION MICRODISCECTOMY Bilateral 12/14/2012   Procedure: Bilateral lumbar three-four, four-five decompressive laminotomy/foraminotomy;  Surgeon: Charlie Pitter, MD;  Location: [place] NEURO ORS;  Service: Neurosurgery;  Laterality:  Bilateral;  . PULSE GENERATOR IMPLANT Bilateral 12/13/2013   Procedure: Bilateral implantable pulse generator placement;  Surgeon: Erline Levine, MD;  Location: [place] NEURO ORS;  Service: Neurosurgery;  Laterality: Bilateral;  Bilateral implantable pulse generator placement  . skin cancer removed     from ears,   12 lft arm  rt leg 15  . SUBTHALAMIC STIMULATOR BATTERY REPLACEMENT Bilateral 07/14/2017   Procedure: BILATERAL IMPLANTED PULSE GENERATOR CHANGE FOR DEEP BRAIN STIMULATOR;  Surgeon: Erline Levine, MD;  Location: [place];  Service: Neurosurgery;  Laterality: Bilateral;  . SUBTHALAMIC STIMULATOR INSERTION Bilateral 12/06/2013   Procedure: SUBTHALAMIC STIMULATOR INSERTION;  Surgeon: Erline Levine, MD;  Location: [place] NEURO ORS;  Service: Neurosurgery;  Laterality: Bilateral;  Bilateral deep brain stimulator placement    Vitals:   09/06/17 0808  BP: 132/72  Pulse: (!) 44  SpO2: 97%    Subjective Assessment - 09/06/17 0800    Subjective  Patient reports he had a couple "minor" falls since his last therapy session. No injuries reported. He continues to report chronic pain when weight bearing over RLE. Denies pain at rest currently. No specific questions or concerns currently.     Pertinent History  Patient referred for physical therapy due to wife's request for patient to get aquatic therapy to help with his conditioning. Pt is also complaining of  chronic low back and R hip pain and frequent falls. 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 The last time he was in the water he was very uncomfortable because he didn't feel like he had any control. Pt has a history of 2 low back surgeries in August 2014 and February 2016. Pt and wife are unable to recall exactly what was performed but they know that he had stenosis. Surgeries were performed by Dr. Annette Stable in [place]. Pt reports that his low back and R hip pain are constant. He describes the pain in the  back as "dull" and the pain in the hip as "sharp." For both back and hip worst pain is 10/10, Best: 2/10, Present: 2/10. No particular episode of trauma to his low back or hip preceding the pain. Pt has a history of Parkinson's with a deep brain stimulator. He ambulates with a 4 wheeled walker called a U-step (automatic brakes, audio cues for stepping). He states that he falls frequently and reports that he has had approximately 40-50 falls in the last 6 months. No specific recent changes in his health.     Limitations  Walking;Standing;Sitting    How long can you sit comfortably?  30 minutes    How long can you stand comfortably?  5 minutes    How long can you walk comfortably?  10 minutes    Patient Stated Goals  Improve comfort in the water, improve balance, decrease back and R hip pain    Currently in Pain?  No/denies    Pain Onset  --          TREATMENT  Ther-ex  Nustep L4,5 minutes for warm-up (unbilled); Standing mini squats x 15 with BUE support; Standing marching 5# ankle weight x 15 bilateral, BUE support; Standing abduction 5# ankle weight x 15 bilateral, BUE support; Standing knee flexion (HS curl) 5# ankle weight x 15 bilateral, BUE support; Standing hip extension 5# ankle weight x 15 bilateral, BUE support; Sit to stand without UE support 2 x 10;  Neuromuscular Re-education 4" step toe taps 10x each leg: cues for where to place foot and frequency of taps; 4" step step-ups alternating LE x 10 each; Airex pad balance with feet apart eyes closed x 30s, feet together x 30s; Airex pad balance with feet apart with balloon pass x 30s; Airex pad balance with feet together with balloon pass x 30s;  Pt. response to medical necessity: Patient will continue to benefit from skilled physical therapy to improve static and dynamic standing balance and decrease fall risk for improved functional capacity of activities                        PT Education -  09/06/17 0811    Education provided  Yes    Education Details  exercise form/technique    Person(s) Educated  Patient    Methods  Explanation    Comprehension  Verbalized understanding       PT Short Term Goals - 07/19/17 1741      PT SHORT TERM GOAL #1   Title  Pt will perform HEP with family's supervision, for improved balance, transfers, and gait.      Baseline  patient reports compliance, however not every day    Time  4    Period  Weeks    Status  Partially Met      PT SHORT  TERM GOAL #2   Title  Pt will perform 5x sit<>stand in less than or equal to 15 seconds for improved efficiency and safety with transfers.    Baseline  19 seconds; deffered at this time due to excessive LOB     Time  2    Period  Weeks    Status  On-going        PT Long Term Goals - 07/19/17 1742      PT LONG TERM GOAL #1   Title  Pt will decrease 5TSTS by at least 3 seconds in order to demonstrate clinically significant improvement in LE strength     Baseline  02/08/17: 20.1 seconds 12/18: 17 seconds ;    Time  8    Period  Weeks    Status  Achieved      PT LONG TERM GOAL #2   Title  Pt will decrease TUG by at least 3.4s in order to demonstrate decreased fall risk     Baseline  02/08/17: 22.1s 12/18: 20 seconds 2/7: 19 seconds; 4/3: 23 seconds with walker    Time  8    Period  Weeks    Status  Partially Met    Target Date  09/13/17      PT LONG TERM GOAL #3   Title  Pt will improve BERG by at least 3 points in order to demonstrate clinically significant improvement in balance.    Baseline  02/08/17: 30/56 12/18: 38/56      Time  8    Period  Weeks    Status  Achieved      PT LONG TERM GOAL #4   Title  Pt/wife will verbalize understanding of plans for continued community fitness upon D/C from PT including aquatic therapy program    Baseline  02/08/17: pt currently reports being more comfortable in the water    Time  8    Period  Weeks    Status  Partially Met    Target Date  09/13/17       PT LONG TERM GOAL #5   Title  Pt will decrease mODI scoreby at least 13 points in order demonstrate clinically significant reduction in pain/disability     Baseline  02/08/17: 54% 12/18: 46% 2/7: 42% 4/3; 50%    Time  8    Period  Weeks    Status  Partially Met    Target Date  09/13/17      PT LONG TERM GOAL #6   Title  Pt will improve BERG to 44/56  in order to demonstrate clinically significant improvement in balance for decreased fall risk    Baseline  12/18: 38/56 2/7: 41/56 4/3: 41/56    Time  8    Period  Weeks    Status  Partially Met    Target Date  09/13/17            Plan - 09/06/17 0814    Clinical Impression Statement  Patient demonstrating improving sequencing with anterior weight shifting during transfers throughout session. He is able to perform toe taps without a metronome and demonstrates improving single leg balance. Pt encouraged to continue HEP and follow-up as scheduled.    Rehab Potential  Fair    PT Frequency  2x / week    PT Duration  8 weeks    PT Treatment/Interventions  ADLs/Self Care Home Management;Aquatic Therapy;Cryotherapy;Electrical Stimulation;Iontophoresis 64m/ml Dexamethasone;Moist Heat;Ultrasound;Contrast Bath;DME Instruction;Gait training;Stair training;Functional mobility training;Therapeutic activities;Therapeutic exercise;Balance training;Neuromuscular re-education;Patient/family education;Manual techniques;Wheelchair  mobility training;Energy conservation;Passive range of motion    PT Next Visit Plan  progress balance, strength, aquatic therapy    PT Home Exercise Plan  None provided at this time until further assessed, wife reports pt does not perform current HEP from [facility]    Consulted and Agree with Plan of Care  Patient;Family member/caregiver    Family Member Consulted  wife       Patient will benefit from skilled therapeutic intervention in order to improve the following deficits and impairments:  Abnormal gait, Decreased  balance, Decreased coordination, Decreased mobility, Impaired tone, Postural dysfunction  Visit Diagnosis: Muscle weakness (generalized)  Repeated falls  Other abnormalities of gait and mobility     Problem List Patient Active Problem List   Diagnosis Date Noted  . 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 Radicular pain in right arm 10/09/2016  . Rash 09/03/2016  . GERD (gastroesophageal reflux disease) 07/31/2016  . Trochanteric bursitis 03/03/2016  . Left hand pain 10/30/2015  . Fracture, finger, multiple sites 10/30/2015  . Colon cancer screening 07/23/2015  . Healthcare maintenance 07/23/2015  . Gout 02/18/2015  . Depression 01/06/2015  . Irritation of eyelid 08/20/2014  . Dupuytren's contracture 08/20/2014  . Cough 07/21/2014  . Lumbar stenosis with neurogenic claudication 06/13/2014  . Preop cardiovascular exam 05/28/2014  . S/P deep brain stimulator placement 05/08/2014  . Joint pain 01/22/2014  . Medicare annual wellness visit, initial 01/19/2014  . Advance care planning 01/19/2014  . Parkinson's disease ([place]) 12/13/2013  . PTSD (post-traumatic stress disorder) 06/13/2013  . Erectile dysfunction 06/13/2013  . HLD (hyperlipidemia) 06/13/2013  . Hip pain 06/10/2013  . Pain in joint, shoulder region 06/10/2013  . Right leg swelling 06/03/2013  . Essential hypertension 06/03/2013  . Bradycardia by electrocardiogram 06/03/2013  . Obstructive sleep apnea 03/12/2013  . Spinal stenosis, lumbar region, with neurogenic claudication 12/14/2012   Phillips Grout PT, DPT   Attikus Bartoszek 09/06/2017, 10:07 AM  [place] [address] [place], [place], [address] Phone: [phone]   Fax:  [phone]  Name: [name] MRN: [id number] Date of Birth: [birth year]

## 2017-09-06 NOTE — Therapy (Signed)
[place] MAIN [facility] SERVICES [address] [place], [place], [address] Phone: [phone]   Fax:  [phone]  Occupational Therapy Treatment  Patient Details  Name: [name] MRN: [id number] Date of Birth: 21-Jun-1950 Referring Provider: Wells Guiles Tat   Encounter Date: 09/06/2017  OT End of Session - 09/06/17 0854    Visit Number  8    Number of Visits  24    Date for OT Re-Evaluation  10/23/17    Authorization Type  Visit 8 of 10 for progress reporting period starting 07/31/2017    OT Start Time  0845    OT Stop Time  0930    OT Time Calculation (min)  45 min    Activity Tolerance  Patient tolerated treatment well    Behavior During Therapy  [facility] for tasks assessed/performed       Past Medical History:  Diagnosis Date  . Arthritis   . Bradycardia   . Cancer [facility]) 2013   skin cancer  . Depression    ptsd  . Dysrhythmia    chronic slow heart rate  . GERD (gastroesophageal reflux disease)   . Headache(784.0)    tension headaches non recent  . History of chicken pox   . History of kidney stones    passed  . Hypertension    treated with HCTZ  . Parkinson's disease ([place])    dx'ed 15 years ago  . PTSD (post-traumatic stress disorder)   . Shortness of breath dyspnea   . Sleep apnea    doesn't use C-pap  . Varicose veins     Past Surgical History:  Procedure Laterality Date  . CHOLECYSTECTOMY N/A 10/22/2014   Procedure: LAPAROSCOPIC CHOLECYSTECTOMY WITH INTRAOPERATIVE CHOLANGIOGRAM;  Surgeon: Dia Crawford III, MD;  Location: [facility] ORS;  Service: General;  Laterality: N/A;  . cyst removed      from lip as a child  . LUMBAR LAMINECTOMY/DECOMPRESSION MICRODISCECTOMY Bilateral 12/14/2012   Procedure: Bilateral lumbar three-four, four-five decompressive laminotomy/foraminotomy;  Surgeon: Charlie Pitter, MD;  Location: [place] NEURO ORS;  Service: Neurosurgery;  Laterality: Bilateral;  . PULSE GENERATOR IMPLANT Bilateral 12/13/2013    Procedure: Bilateral implantable pulse generator placement;  Surgeon: Erline Levine, MD;  Location: [place] NEURO ORS;  Service: Neurosurgery;  Laterality: Bilateral;  Bilateral implantable pulse generator placement  . skin cancer removed     from ears,   12 lft arm  rt leg 15  . SUBTHALAMIC STIMULATOR BATTERY REPLACEMENT Bilateral 07/14/2017   Procedure: BILATERAL IMPLANTED PULSE GENERATOR CHANGE FOR DEEP BRAIN STIMULATOR;  Surgeon: Erline Levine, MD;  Location: [place];  Service: Neurosurgery;  Laterality: Bilateral;  . SUBTHALAMIC STIMULATOR INSERTION Bilateral 12/06/2013   Procedure: SUBTHALAMIC STIMULATOR INSERTION;  Surgeon: Erline Levine, MD;  Location: [place] NEURO ORS;  Service: Neurosurgery;  Laterality: Bilateral;  Bilateral deep brain stimulator placement    There were no vitals filed for this visit.  Subjective Assessment - 09/06/17 0852    Subjective   Pt. reports having had a couple minor falls since he was at therapy last.    Patient is accompained by:  Family member    Pertinent History  Pt. is a 67 y.o. mlae who has a history of Parkinson's Disease. Pt. has a Deep Brain Stimulator in place. Pt. has a history of Low Back Pain with 2 back surgeries in August 2014, and February 2016. Pt. has a history of multiple frequent falls occurring at a rate of 6x's a week.  Limitations  Frequent falls, limited motor control, and [place].     Patient Stated Goals  To return home.    Currently in Pain?  No/denies      OT TREATMENT    Neuro muscular re-education:  Pt.  focused on improving [facility] with manipulating nuts and bolts positioned vertically. Pt. was able to unscrew both the 1" and 1/2" nuts, however was unable to remove the nuts without dropping them. Pt. worked on picking up the nuts from the table and placing them back onto the bolt. Pt. Had more difficulty grasping and placing the nuts into position and alignment. Pt. required increased time and cues for hand control during the task. Pt. Worked  on alternating hand movements grasping cards alternating thumb on fingers, and fingers on thumb  Therapeutic Exercise:  Pt. worked on Retail banker using [employer]  tools with green theraband using the knob turn, cap turn, peg turn, L-Bar and key turn attachments to target specific components of movements in the hand. Pt. worked with the knobturn attachments to work to focus on standard gripping, and weightbearing push. Pt. worked on using the key turn attachment for standard turning, lateral pinch turn. Pt. worked on using the cap turn attachment for standard turning, and lateral grasp turn. Pt. Worked on gradually increasing speed.                          OT Education - 09/06/17 [phone]    Education provided  Yes    Education Details  UE functioning    Person(s) Educated  Patient    Methods  Explanation    Comprehension  Verbalized understanding          OT Long Term Goals - 07/31/17 1122      OT LONG TERM GOAL #1   Title  Pt. will improve Bilateral UE strength by 1 mm grade to assist with ADLs, and IADLs    Baseline  EVal: BUE strength: 4+/5 overall    Time  12    Period  Weeks    Status  On-going    Target Date  10/23/17      OT LONG TERM GOAL #2   Title  Pt. will improve grip strength to be able to pour himself a beverage.    Baseline  Eval: Pt. is unable to hold and pour a beevrage.    Time  12    Period  Weeks    Status  New    Target Date  10/23/17      OT LONG TERM GOAL #3   Title  Pt. will improve lateral pinch stength to be able to independently turn a key.     Baseline  Eval: Pt. has difficulty holding, and turning  key.    Time  12    Period  Weeks    Status  New    Target Date  10/23/17      OT LONG TERM GOAL #4   Title  Pt. will improve Right hand [facility] skills to be able to independently grasp and use small objects needed for fixing car parts.    Baseline  Eval: Right hand 1 min. & 5 sec. on the 9 hole peg test.    Time  12     Period  Weeks    Status  New    Target Date  10/23/17      OT LONG TERM GOAL #5   Title  Pt. will be demonstarte self-feeding with modified independence with minimal spillage.    Baseline  Eval: Pt. requires assistance, and has alot of spillage.    Time  12    Period  Weeks    Status  On-going    Target Date  10/23/17      Long Term Additional Goals   Additional Long Term Goals  --      OT LONG TERM GOAL #6   Title  Pt. will manipulate buttons, and cuff buttons independently.    Baseline  Eval: Pt. has difficulty     Time  12    Target Date  10/23/17      OT LONG TERM GOAL #7   Title  Pt. will write 1 sentence efficiently with 90% legibility.    Baseline  Eval: mname only 50% legibility    Time  12    Period  Weeks    Status  New    Target Date  10/23/17            Plan - 09/06/17 0854    Clinical Impression Statement  Pt. has had multiple falls since his last treat session. Pt. reports no injuries during these falls. Pt. presents with limited UE strength, and fine motor coordination skills.     Occupational performance deficits (Please refer to evaluation for details):  ADL's;IADL's    Rehab Potential  Good    Current Impairments/barriers affecting progress:  Positive Indicators: age, familiy support, motivation. Negative Indicators: Multiple comorbidities, and history of falls.    OT Frequency  2x / week    OT Duration  12 weeks    OT Treatment/Interventions  Self-care/ADL training;Therapeutic exercise;DME and/or AE instruction;Energy conservation;Neuromuscular education;Patient/family education;Therapeutic activities    Clinical Decision Making  Several treatment options, min-mod task modification necessary    OT Home Exercise Plan  HEP with  Green Theraputty    Consulted and Agree with Plan of Care  Patient       Patient will benefit from skilled therapeutic intervention in order to improve the following deficits and impairments:  Pain, Decreased strength,  Decreased coordination, Decreased balance, Impaired UE functional use, Decreased safety awareness, Decreased activity tolerance, Decreased cognition, Impaired flexibility, Impaired vision/preception  Visit Diagnosis: Muscle weakness (generalized)  Other lack of coordination    Problem List Patient Active Problem List   Diagnosis Date Noted  . 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 Radicular pain in right arm 10/09/2016  . Rash 09/03/2016  . GERD (gastroesophageal reflux disease) 07/31/2016  . Trochanteric bursitis 03/03/2016  . Left hand pain 10/30/2015  . Fracture, finger, multiple sites 10/30/2015  . Colon cancer screening 07/23/2015  . Healthcare maintenance 07/23/2015  . Gout 02/18/2015  . Depression 01/06/2015  . Irritation of eyelid 08/20/2014  . Dupuytren's contracture 08/20/2014  . Cough 07/21/2014  . Lumbar stenosis with neurogenic claudication 06/13/2014  . Preop cardiovascular exam 05/28/2014  . S/P deep brain stimulator placement 05/08/2014  . Joint pain 01/22/2014  . Medicare annual wellness visit, initial 01/19/2014  . Advance care planning 01/19/2014  . Parkinson's disease ([place]) 12/13/2013  . PTSD (post-traumatic stress disorder) 06/13/2013  . Erectile dysfunction 06/13/2013  . HLD (hyperlipidemia) 06/13/2013  . Hip pain 06/10/2013  . Pain in joint, shoulder region 06/10/2013  . Right leg swelling 06/03/2013  . Essential hypertension 06/03/2013  . Bradycardia by electrocardiogram 06/03/2013  . Obstructive sleep apnea 03/12/2013  . Spinal stenosis, lumbar region, with neurogenic claudication 12/14/2012    Harrel Carina,  MS, OTR/L 09/06/2017, 9:06 AM  [place] MAIN [facility] SERVICES [address] [place], [place], [address] Phone: [phone]   Fax:  [phone]  Name: [name] MRN: [id number] Date of Birth: 1950-05-24

## 2017-09-07 ENCOUNTER — Ambulatory Visit: Payer: Self-pay | Admitting: Psychology

## 2017-09-08 ENCOUNTER — Other Ambulatory Visit: Payer: Self-pay

## 2017-09-08 ENCOUNTER — Ambulatory Visit: Payer: Medicare PPO | Admitting: Physical Therapy

## 2017-09-08 ENCOUNTER — Ambulatory Visit: Payer: Medicare PPO | Admitting: Occupational Therapy

## 2017-09-08 ENCOUNTER — Encounter: Payer: Self-pay | Admitting: Occupational Therapy

## 2017-09-08 DIAGNOSIS — R2689 Other abnormalities of gait and mobility: Secondary | ICD-10-CM

## 2017-09-08 DIAGNOSIS — M6281 Muscle weakness (generalized): Secondary | ICD-10-CM

## 2017-09-08 DIAGNOSIS — R278 Other lack of coordination: Secondary | ICD-10-CM | POA: Diagnosis not present

## 2017-09-08 DIAGNOSIS — R296 Repeated falls: Secondary | ICD-10-CM | POA: Diagnosis not present

## 2017-09-08 DIAGNOSIS — R293 Abnormal posture: Secondary | ICD-10-CM | POA: Diagnosis not present

## 2017-09-08 DIAGNOSIS — R2681 Unsteadiness on feet: Secondary | ICD-10-CM | POA: Diagnosis not present

## 2017-09-08 DIAGNOSIS — R279 Unspecified lack of coordination: Secondary | ICD-10-CM | POA: Diagnosis not present

## 2017-09-08 NOTE — Therapy (Signed)
[place] MAIN [facility] SERVICES [address] [place], [place], [address] Phone: [phone]   Fax:  [phone]  Occupational Therapy Treatment  Patient Details  Name: [name] MRN: [id number] Date of Birth: [birth year] Referring Provider: Wells Guiles Tat   Encounter Date: 09/08/2017  OT End of Session - 09/08/17 0936    Visit Number  9    Number of Visits  24    Date for OT Re-Evaluation  10/23/17    Authorization Type  Visit 9 of 10 for progress reporting period starting 07/31/2017    OT Start Time  0845    OT Stop Time  0917    OT Time Calculation (min)  32 min    Activity Tolerance  Patient tolerated treatment well    Behavior During Therapy  [facility] for tasks assessed/performed       Past Medical History:  Diagnosis Date  . Arthritis   . Bradycardia   . Cancer [facility]) 2013   skin cancer  . Depression    ptsd  . Dysrhythmia    chronic slow heart rate  . GERD (gastroesophageal reflux disease)   . Headache(784.0)    tension headaches non recent  . History of chicken pox   . History of kidney stones    passed  . Hypertension    treated with HCTZ  . Parkinson's disease ([place])    dx'ed 15 years ago  . PTSD (post-traumatic stress disorder)   . Shortness of breath dyspnea   . Sleep apnea    doesn't use C-pap  . Varicose veins     Past Surgical History:  Procedure Laterality Date  . CHOLECYSTECTOMY N/A 10/22/2014   Procedure: LAPAROSCOPIC CHOLECYSTECTOMY WITH INTRAOPERATIVE CHOLANGIOGRAM;  Surgeon: Dia Crawford III, MD;  Location: [facility] ORS;  Service: General;  Laterality: N/A;  . cyst removed      from lip as a child  . LUMBAR LAMINECTOMY/DECOMPRESSION MICRODISCECTOMY Bilateral 12/14/2012   Procedure: Bilateral lumbar three-four, four-five decompressive laminotomy/foraminotomy;  Surgeon: Charlie Pitter, MD;  Location: [place] NEURO ORS;  Service: Neurosurgery;  Laterality: Bilateral;  . PULSE GENERATOR IMPLANT Bilateral 12/13/2013    Procedure: Bilateral implantable pulse generator placement;  Surgeon: Erline Levine, MD;  Location: [place] NEURO ORS;  Service: Neurosurgery;  Laterality: Bilateral;  Bilateral implantable pulse generator placement  . skin cancer removed     from ears,   12 lft arm  rt leg 15  . SUBTHALAMIC STIMULATOR BATTERY REPLACEMENT Bilateral 07/14/2017   Procedure: BILATERAL IMPLANTED PULSE GENERATOR CHANGE FOR DEEP BRAIN STIMULATOR;  Surgeon: Erline Levine, MD;  Location: [place];  Service: Neurosurgery;  Laterality: Bilateral;  . SUBTHALAMIC STIMULATOR INSERTION Bilateral 12/06/2013   Procedure: SUBTHALAMIC STIMULATOR INSERTION;  Surgeon: Erline Levine, MD;  Location: [place] NEURO ORS;  Service: Neurosurgery;  Laterality: Bilateral;  Bilateral deep brain stimulator placement    There were no vitals filed for this visit.  Subjective Assessment - 09/08/17 0927    Subjective   Pt stated he was doing fine today.    Patient is accompained by:  Family member    Pertinent History  Pt. is a 67 y.o. mlae who has a history of Parkinson's Disease. Pt. has a Deep Brain Stimulator in place. Pt. has a history of Low Back Pain with 2 back surgeries in August 2014, and February 2016. Pt. has a history of multiple frequent falls occurring at a rate of 6x's a week.     Limitations  Frequent falls, limited motor  control, and Belt.     Currently in Pain?  No/denies                   OT Treatments/Exercises (OP) - 09/08/17 7846      ADLs   Writing  Pt seen for handwriting exercises with R hand.  Pt stated that he was not able to write at all with his R hand but had lisgible cursive handwriting for his name, first and last and his wife's first name, [name], with 90% legibility but needed extra time and encouragment.  He struggled with the date and wrote that it was 2001 and knew the month but was off by a day for the 24th. 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      Neurological Re-education  Exercises   Other Exercises 1  Pt seen for isolated finger movements of R hand and bilateral use with L hand for nuts, bolts, screws and washers with min assit and cues and dropped washers 3 times. Pt able to flex B shoulders to 95 degrees with elbow extension and maintain for task for about 15 minutes but was fatigued at end and needed a rest break afterwards.     Other Exercises 2  Pt participated in yellow digiflex with improved isolated movements of index and middle finger but 4th and 5th digits were more difficult.  Tol 4 sets of 20 for gross grasp and 3 sets of 10 for digits 2 and 3.               OT Education - 09/08/17 [id number]    Education provided  Yes    Education Details  HEP fine motor activities and stengthening    Person(s) Educated  Patient    Methods  Explanation;Demonstration    Comprehension  Verbalized understanding;Returned demonstration          OT Long Term Goals - 07/31/17 1122      OT LONG TERM GOAL #1   Title  Pt. will improve Bilateral UE strength by 1 mm grade to assist with ADLs, and IADLs    Baseline  EVal: BUE strength: 4+/5 overall    Time  12    Period  Weeks    Status  On-going    Target Date  10/23/17      OT LONG TERM GOAL #2   Title  Pt. will improve grip strength to be able to pour himself a beverage.    Baseline  Eval: Pt. is unable to hold and pour a beevrage.    Time  12    Period  Weeks    Status  New    Target Date  10/23/17      OT LONG TERM GOAL #3   Title  Pt. will improve lateral pinch stength to be able to independently turn a key.     Baseline  Eval: Pt. has difficulty holding, and turning  key.    Time  12    Period  Weeks    Status  New    Target Date  10/23/17      OT LONG TERM GOAL #4   Title  Pt. will improve Right hand [facility] skills to be able to independently grasp and use small objects needed for fixing car parts.    Baseline  Eval: Right hand 1 min. & 5 sec. on the 9 hole peg test.    Time  12  Period  Weeks     Status  New    Target Date  10/23/17      OT LONG TERM GOAL #5   Title  Pt. will be demonstarte self-feeding with modified independence with minimal spillage.    Baseline  Eval: Pt. requires assistance, and has alot of spillage.    Time  12    Period  Weeks    Status  On-going    Target Date  10/23/17      Long Term Additional Goals   Additional Long Term Goals  --      OT LONG TERM GOAL #6   Title  Pt. will manipulate buttons, and cuff buttons independently.    Baseline  Eval: Pt. has difficulty     Time  12    Target Date  10/23/17      OT LONG TERM GOAL #7   Title  Pt. will write 1 sentence efficiently with 90% legibility.    Baseline  Eval: mname only 50% legibility    Time  12    Period  Weeks    Status  New    Target Date  10/23/17            Plan - 09/08/17 0936    Clinical Impression Statement  Pt requested having PT for 15 minutes longer and a shorter session with OT (30 min vs 45 min). He continues to be motivated to regain strength and fine motor control with good participation today.  He is making progress toward increasing independence in ADLs and IADLS.    Occupational performance deficits (Please refer to evaluation for details):  ADL's;IADL's    Rehab Potential  Good    Current Impairments/barriers affecting progress:  Positive Indicators: age, familiy support, motivation. Negative Indicators: Multiple comorbidities, and history of falls.    OT Frequency  2x / week    OT Duration  12 weeks    OT Treatment/Interventions  Self-care/ADL training;Therapeutic exercise;DME and/or AE instruction;Energy conservation;Neuromuscular education;Patient/family education;Therapeutic activities    Clinical Decision Making  Several treatment options, min-mod task modification necessary    OT Home Exercise Plan  HEP for using rubber bands for strengthening at home and work on more handwriting.    Consulted and Agree with Plan of Care  Patient       Patient will  benefit from skilled therapeutic intervention in order to improve the following deficits and impairments:  Pain, Decreased strength, Decreased coordination, Decreased balance, Impaired UE functional use, Decreased safety awareness, Decreased activity tolerance, Decreased cognition, Impaired flexibility, Impaired vision/preception  Visit Diagnosis: Muscle weakness (generalized)  Other lack of coordination    Problem List Patient Active Problem List   Diagnosis Date Noted  . 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 Radicular pain in right arm 10/09/2016  . Rash 09/03/2016  . GERD (gastroesophageal reflux disease) 07/31/2016  . Trochanteric bursitis 03/03/2016  . Left hand pain 10/30/2015  . Fracture, finger, multiple sites 10/30/2015  . Colon cancer screening 07/23/2015  . Healthcare maintenance 07/23/2015  . Gout 02/18/2015  . Depression 01/06/2015  . Irritation of eyelid 08/20/2014  . Dupuytren's contracture 08/20/2014  . Cough 07/21/2014  . Lumbar stenosis with neurogenic claudication 06/13/2014  . Preop cardiovascular exam 05/28/2014  . S/P deep brain stimulator placement 05/08/2014  . Joint pain 01/22/2014  . Medicare annual wellness visit, initial 01/19/2014  . Advance care planning 01/19/2014  . Parkinson's disease ([place]) 12/13/2013  . PTSD (post-traumatic stress disorder) 06/13/2013  . Erectile  dysfunction 06/13/2013  . HLD (hyperlipidemia) 06/13/2013  . Hip pain 06/10/2013  . Pain in joint, shoulder region 06/10/2013  . Right leg swelling 06/03/2013  . Essential hypertension 06/03/2013  . Bradycardia by electrocardiogram 06/03/2013  . Obstructive sleep apnea 03/12/2013  . Spinal stenosis, lumbar region, with neurogenic claudication 12/14/2012    Chrys Racer, OTR/L ascom [phone] 09/08/17, 9:41 AM  [place], [place], [address] Phone: [phone]   Fax:  [phone]  Name:  [name] MRN: [id number] Date of Birth: Nov 13, 1950

## 2017-09-08 NOTE — Therapy (Signed)
[place] MAIN [facility] SERVICES [address] [place], [place], [address] Phone: [phone]   Fax:  [phone]  Physical Therapy Treatment  Patient Details  Name: [name] MRN: [id number] Date of Birth: [birth year] Referring Provider: Wells Guiles Tat   Encounter Date: 09/08/2017  PT End of Session - 09/08/17 0809    Visit Number  36    Number of Visits  49    Date for PT Re-Evaluation  09/13/17    Authorization Type  7/10 of progress note    PT Start Time  0803    PT Stop Time  0845    PT Time Calculation (min)  42 min    Equipment Utilized During Treatment  Gait belt    Activity Tolerance  Patient tolerated treatment well;Patient limited by fatigue    Behavior During Therapy  [facility] for tasks assessed/performed       Past Medical History:  Diagnosis Date  . Arthritis   . Bradycardia   . Cancer [facility]) 2013   skin cancer  . Depression    ptsd  . Dysrhythmia    chronic slow heart rate  . GERD (gastroesophageal reflux disease)   . Headache(784.0)    tension headaches non recent  . History of chicken pox   . History of kidney stones    passed  . Hypertension    treated with HCTZ  . Parkinson's disease ([place])    dx'ed 15 years ago  . PTSD (post-traumatic stress disorder)   . Shortness of breath dyspnea   . Sleep apnea    doesn't use C-pap  . Varicose veins     Past Surgical History:  Procedure Laterality Date  . CHOLECYSTECTOMY N/A 10/22/2014   Procedure: LAPAROSCOPIC CHOLECYSTECTOMY WITH INTRAOPERATIVE CHOLANGIOGRAM;  Surgeon: Dia Crawford III, MD;  Location: [facility] ORS;  Service: General;  Laterality: N/A;  . cyst removed      from lip as a child  . LUMBAR LAMINECTOMY/DECOMPRESSION MICRODISCECTOMY Bilateral 12/14/2012   Procedure: Bilateral lumbar three-four, four-five decompressive laminotomy/foraminotomy;  Surgeon: Charlie Pitter, MD;  Location: [place] NEURO ORS;  Service: Neurosurgery;  Laterality: Bilateral;  . PULSE GENERATOR  IMPLANT Bilateral 12/13/2013   Procedure: Bilateral implantable pulse generator placement;  Surgeon: Erline Levine, MD;  Location: [place] NEURO ORS;  Service: Neurosurgery;  Laterality: Bilateral;  Bilateral implantable pulse generator placement  . skin cancer removed     from ears,   12 lft arm  rt leg 15  . SUBTHALAMIC STIMULATOR BATTERY REPLACEMENT Bilateral 07/14/2017   Procedure: BILATERAL IMPLANTED PULSE GENERATOR CHANGE FOR DEEP BRAIN STIMULATOR;  Surgeon: Erline Levine, MD;  Location: [place];  Service: Neurosurgery;  Laterality: Bilateral;  . SUBTHALAMIC STIMULATOR INSERTION Bilateral 12/06/2013   Procedure: SUBTHALAMIC STIMULATOR INSERTION;  Surgeon: Erline Levine, MD;  Location: [place] NEURO ORS;  Service: Neurosurgery;  Laterality: Bilateral;  Bilateral deep brain stimulator placement    There were no vitals filed for this visit.  Subjective Assessment - 09/08/17 0808    Subjective  Pt doing fine today. Reports no recent falls, injury, or medication changes since last visit. He denies any pain at this time.     Currently in Pain?  No/denies          TREATMENT   Ther-ex  -Nustep L4, 4 minutes for warm-up (unbilled); -Standing mini squats x 15 with BUE support; -Standing marching 5# ankle weight x 15 bilateral, BUE support; -Standing abduction 5# ankle weight x 15 bilateral, BUE support; -Standing knee  flexion (HS curl) 5# ankle weight x 15 bilateral, BUE support; -Standing hip extension 5# ankle weight x 15 bilateral, BUE support; -Sit to stand without UE support 2 x 10;   (chair +2 airex pad, added front loaded sports ball and overhead reach in stance each set)   Neuromuscular Re-education -4" step toe taps 10x each leg: cues for where to place foot and frequency of taps; (progressed to hands free, with progressive LOB toward end of set) -4" step step-ups alternating LE x 10 each (progressed to single UE support on // bars)  -Airex pad balance with feet apart eyes closed 5 x 30s,   (Airex pad balance with feet apart with balloon pass x 30s) unable to get to for time.     Pt. response to medical necessity: Patient will continue to benefit from skilled physical therapy to improve static and dynamic standing balance and decrease fall risk for improved functional capacity of activities       PT Short Term Goals - 07/19/17 1741      PT SHORT TERM GOAL #1   Title  Pt will perform HEP with family's supervision, for improved balance, transfers, and gait.      Baseline  patient reports compliance, however not every day    Time  4    Period  Weeks    Status  Partially Met      PT SHORT TERM GOAL #2   Title  Pt will perform 5x sit<>stand in less than or equal to 15 seconds for improved efficiency and safety with transfers.    Baseline  19 seconds; deffered at this time due to excessive LOB     Time  2    Period  Weeks    Status  On-going        PT Long Term Goals - 07/19/17 1742      PT LONG TERM GOAL #1   Title  Pt will decrease 5TSTS by at least 3 seconds in order to demonstrate clinically significant improvement in LE strength     Baseline  02/08/17: 20.1 seconds 12/18: 17 seconds ;    Time  8    Period  Weeks    Status  Achieved      PT LONG TERM GOAL #2   Title  Pt will decrease TUG by at least 3.4s in order to demonstrate decreased fall risk     Baseline  02/08/17: 22.1s 12/18: 20 seconds 2/7: 19 seconds; 4/3: 23 seconds with walker    Time  8    Period  Weeks    Status  Partially Met    Target Date  09/13/17      PT LONG TERM GOAL #3   Title  Pt will improve BERG by at least 3 points in order to demonstrate clinically significant improvement in balance.    Baseline  02/08/17: 30/56 12/18: 38/56      Time  8    Period  Weeks    Status  Achieved      PT LONG TERM GOAL #4   Title  Pt/wife will verbalize understanding of plans for continued community fitness upon D/C from PT including aquatic therapy program    Baseline  02/08/17: pt currently  reports being more comfortable in the water    Time  8    Period  Weeks    Status  Partially Met    Target Date  09/13/17      PT LONG TERM GOAL #  5   Title  Pt will decrease mODI scoreby at least 13 points in order demonstrate clinically significant reduction in pain/disability     Baseline  02/08/17: 54% 12/18: 46% 2/7: 42% 4/3; 50%    Time  8    Period  Weeks    Status  Partially Met    Target Date  09/13/17      PT LONG TERM GOAL #6   Title  Pt will improve BERG to 44/56  in order to demonstrate clinically significant improvement in balance for decreased fall risk    Baseline  12/18: 38/56 2/7: 41/56 4/3: 41/56    Time  8    Period  Weeks    Status  Partially Met    Target Date  09/13/17            Plan - 09/08/17 0810    Clinical Impression Statement  Continued with current program emphasizing motor control with weight shifting and transfers, as well as more complex balance activities, both satic and dynmaic. Pt conitnues to need support for transfers both for effort and safety. Verbal cues given to improve movement quality. Pt making good progressoverall.     Rehab Potential  Fair    PT Frequency  2x / week    PT Duration  8 weeks    PT Treatment/Interventions  ADLs/Self Care Home Management;Aquatic Therapy;Cryotherapy;Electrical Stimulation;Iontophoresis '4mg'$ /ml Dexamethasone;Moist Heat;Ultrasound;Contrast Bath;DME Instruction;Gait training;Stair training;Functional mobility training;Therapeutic activities;Therapeutic exercise;Balance training;Neuromuscular re-education;Patient/family education;Manual techniques;Wheelchair mobility training;Energy conservation;Passive range of motion    PT Next Visit Plan  progress balance, strength, aquatic therapy    PT Home Exercise Plan  None provided at this time until further assessed, wife reports pt does not perform current HEP from [facility] PT    Consulted and Agree with Plan of Care  Patient       Patient will benefit from skilled  therapeutic intervention in order to improve the following deficits and impairments:  Abnormal gait, Decreased balance, Decreased coordination, Decreased mobility, Impaired tone, Postural dysfunction  Visit Diagnosis: Muscle weakness (generalized)  Repeated falls  Other abnormalities of gait and mobility     Problem List Patient Active Problem List   Diagnosis Date Noted  . 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 Radicular pain in right arm 10/09/2016  . Rash 09/03/2016  . GERD (gastroesophageal reflux disease) 07/31/2016  . Trochanteric bursitis 03/03/2016  . Left hand pain 10/30/2015  . Fracture, finger, multiple sites 10/30/2015  . Colon cancer screening 07/23/2015  . Healthcare maintenance 07/23/2015  . Gout 02/18/2015  . Depression 01/06/2015  . Irritation of eyelid 08/20/2014  . Dupuytren's contracture 08/20/2014  . Cough 07/21/2014  . Lumbar stenosis with neurogenic claudication 06/13/2014  . Preop cardiovascular exam 05/28/2014  . S/P deep brain stimulator placement 05/08/2014  . Joint pain 01/22/2014  . Medicare annual wellness visit, initial 01/19/2014  . Advance care planning 01/19/2014  . Parkinson's disease ([place]) 12/13/2013  . PTSD (post-traumatic stress disorder) 06/13/2013  . Erectile dysfunction 06/13/2013  . HLD (hyperlipidemia) 06/13/2013  . Hip pain 06/10/2013  . Pain in joint, shoulder region 06/10/2013  . Right leg swelling 06/03/2013  . Essential hypertension 06/03/2013  . Bradycardia by electrocardiogram 06/03/2013  . Obstructive sleep apnea 03/12/2013  . Spinal stenosis, lumbar region, with neurogenic claudication 12/14/2012   8:50 AM, 09/08/17 Etta Grandchild, PT, DPT Physical Therapist - [facility]  [phone] ([place])     [place] 09/08/2017, 8:26 AM  [place]  [place] MAIN [facility] SERVICES [place], [place], [address] Phone: [phone]   Fax:   [phone]  Name: RAIF CHACHERE MRN: [id number] Date of Birth: 11-Oct-1950

## 2017-09-12 ENCOUNTER — Ambulatory Visit: Payer: Medicare PPO | Admitting: Occupational Therapy

## 2017-09-14 ENCOUNTER — Ambulatory Visit: Payer: Medicare PPO | Admitting: Occupational Therapy

## 2017-09-14 ENCOUNTER — Ambulatory Visit: Payer: Medicare PPO | Admitting: Physical Therapy

## 2017-09-14 ENCOUNTER — Encounter: Payer: Self-pay | Admitting: Occupational Therapy

## 2017-09-14 ENCOUNTER — Encounter: Payer: Self-pay | Admitting: Physical Therapy

## 2017-09-14 DIAGNOSIS — R279 Unspecified lack of coordination: Secondary | ICD-10-CM | POA: Diagnosis not present

## 2017-09-14 DIAGNOSIS — R296 Repeated falls: Secondary | ICD-10-CM | POA: Diagnosis not present

## 2017-09-14 DIAGNOSIS — R2689 Other abnormalities of gait and mobility: Secondary | ICD-10-CM | POA: Diagnosis not present

## 2017-09-14 DIAGNOSIS — R293 Abnormal posture: Secondary | ICD-10-CM

## 2017-09-14 DIAGNOSIS — M6281 Muscle weakness (generalized): Secondary | ICD-10-CM | POA: Diagnosis not present

## 2017-09-14 DIAGNOSIS — R278 Other lack of coordination: Secondary | ICD-10-CM | POA: Diagnosis not present

## 2017-09-14 DIAGNOSIS — R2681 Unsteadiness on feet: Secondary | ICD-10-CM | POA: Diagnosis not present

## 2017-09-14 NOTE — Therapy (Addendum)
[place] MAIN [facility] SERVICES [address] [place], [place], [address] Phone: [phone]   Fax:  [phone]  Occupational Therapy Progress Note                                                                                                        Dates of reporting period  07/31/2017   to   09/14/2017  Patient Details  Name: [name] MRN: [id number] Date of Birth: 1950-06-27 Referring Provider: Wells Guiles Tat   Encounter Date: 09/14/2017  OT End of Session - 09/14/17 0851    Visit Number  10    Number of Visits  24    Date for OT Re-Evaluation  10/23/17    Authorization Type  Visit 10 of 10 for progress reporting period starting 07/31/2017    OT Start Time  0845    OT Stop Time  0930    OT Time Calculation (min)  45 min    Activity Tolerance  Patient tolerated treatment well    Behavior During Therapy  [facility] for tasks assessed/performed       Past Medical History:  Diagnosis Date  . Arthritis   . Bradycardia   . Cancer [facility]) 2013   skin cancer  . Depression    ptsd  . Dysrhythmia    chronic slow heart rate  . GERD (gastroesophageal reflux disease)   . Headache(784.0)    tension headaches non recent  . History of chicken pox   . History of kidney stones    passed  . Hypertension    treated with HCTZ  . Parkinson's disease ([place])    dx'ed 15 years ago  . PTSD (post-traumatic stress disorder)   . Shortness of breath dyspnea   . Sleep apnea    doesn't use C-pap  . Varicose veins     Past Surgical History:  Procedure Laterality Date  . CHOLECYSTECTOMY N/A 10/22/2014   Procedure: LAPAROSCOPIC CHOLECYSTECTOMY WITH INTRAOPERATIVE CHOLANGIOGRAM;  Surgeon: Dia Crawford III, MD;  Location: [facility] ORS;  Service: General;  Laterality: N/A;  . cyst removed      from lip as a child  . LUMBAR LAMINECTOMY/DECOMPRESSION MICRODISCECTOMY Bilateral 12/14/2012   Procedure: Bilateral lumbar three-four, four-five decompressive  laminotomy/foraminotomy;  Surgeon: Charlie Pitter, MD;  Location: [place] NEURO ORS;  Service: Neurosurgery;  Laterality: Bilateral;  . PULSE GENERATOR IMPLANT Bilateral 12/13/2013   Procedure: Bilateral implantable pulse generator placement;  Surgeon: Erline Levine, MD;  Location: [place] NEURO ORS;  Service: Neurosurgery;  Laterality: Bilateral;  Bilateral implantable pulse generator placement  . skin cancer removed     from ears,   12 lft arm  rt leg 15  . SUBTHALAMIC STIMULATOR BATTERY REPLACEMENT Bilateral 07/14/2017   Procedure: BILATERAL IMPLANTED PULSE GENERATOR CHANGE FOR DEEP BRAIN STIMULATOR;  Surgeon: Erline Levine, MD;  Location: [place];  Service: Neurosurgery;  Laterality: Bilateral;  . SUBTHALAMIC STIMULATOR INSERTION Bilateral 12/06/2013   Procedure: SUBTHALAMIC STIMULATOR INSERTION;  Surgeon: Erline Levine, MD;  Location: [place] NEURO ORS;  Service: Neurosurgery;  Laterality: Bilateral;  Bilateral deep brain stimulator placement    There were no vitals filed for this visit.  Subjective Assessment - 09/14/17 0849    Subjective   Pt. reports he had 4 falls yesterday, and 4 falls the day before.    Patient is accompained by:  Family member    Pertinent History  Pt. is a 67 y.o. mlae who has a history of Parkinson's Disease. Pt. has a Deep Brain Stimulator in place. Pt. has a history of Low Back Pain with 2 back surgeries in August 2014, and February 2016. Pt. has a history of multiple frequent falls occurring at a rate of 6x's a week.     Limitations  Frequent falls, limited motor control, and [place].     Currently in Pain?  No/denies    Pain Score  5     Pain Location  Hip       OT TREATMENT   Selfcare:  Pt. worked on typing tasks. Pt. Typing speed was 3 wpm with 41% accuracy, after 3 attempts, and moderate cues for hand position. Several times the pt. pressed a button to bump him out of the typing skills website.  Pt. worked on [employer] in a word program from written text. Increased time, and  assistance was required.                         OT Education - 09/14/17 [phone]    Education provided  Yes    Education Details  [facility]    Person(s) Educated  Patient    Methods  Explanation    Comprehension  Verbalized understanding          OT Long Term Goals - 09/14/17 0954      OT LONG TERM GOAL #1   Title  Pt. will improve Bilateral UE strength by 1 mm grade to assist with ADLs, and IADLs    Baseline  EVal: BUE strength: 4+/5 overall    Time  12    Period  Weeks    Status  On-going    Target Date  10/23/17      OT LONG TERM GOAL #2   Title  Pt. will improve grip strength to be able to pour himself a beverage.    Baseline  Eval: Pt. is unable to hold and pour a beevrage.    Time  12    Period  Weeks    Status  On-going    Target Date  10/23/17      OT LONG TERM GOAL #3   Title  Pt. will improve lateral pinch stength to be able to independently turn a key.     Baseline  Eval: Pt. has difficulty holding, and turning  key.    Time  12    Period  Weeks    Status  On-going    Target Date  10/23/17      OT LONG TERM GOAL #4   Title  Pt. will improve Right hand [facility] skills to be able to independently grasp and use small objects needed for fixing car parts.    Baseline  Eval: Right hand 1 min. & 5 sec. on the 9 hole peg test.    Time  12    Period  Weeks    Status  On-going    Target Date  10/23/17      OT LONG TERM GOAL #5   Title  Pt. will be demonstarte self-feeding with modified  independence with minimal spillage.    Baseline  Eval: Pt. requires assistance, and has alot of spillage.    Time  12    Period  Weeks    Status  On-going    Target Date  10/23/17      Long Term Additional Goals   Additional Long Term Goals  Yes      OT LONG TERM GOAL #6   Title  Pt. will manipulate buttons, and cuff buttons independently.    Baseline  Eval: Pt. has difficulty     Time  12    Period  Weeks    Status  On-going    Target Date  10/23/17      OT  LONG TERM GOAL #7   Title  Pt. will write 1 sentence efficiently with 90% legibility.    Baseline  Eval: name only 50% legibility    Time  12    Period  Weeks    Status  On-going    Target Date  10/23/17      OT LONG TERM GOAL #8   Title  Pt. will improve typing speed, and accuracy to be able to to type emails efficently    Baseline  09/14/2017: Typing speed: 3 wpm, Accuracy: 41% after 3 attempts with moderate cues for hand placement    Time  12    Period  Weeks    Status  New    Target Date  12/07/17            Plan - 09/14/17 6734    Clinical Impression Statement Pt. worked on [facility] skills for typing tasks. Typing speed for a 5 min. typing test was 3 wpm, with 41% accuracy after 3rd attempt, and moderates cues for hand position. Pt. continues to work on improving UE strength, and coordination skills for improved ADL, and IADL functioning.    Occupational performance deficits (Please refer to evaluation for details):  ADL's;IADL's    Rehab Potential  Good    Current Impairments/barriers affecting progress:  Positive Indicators: age, familiy support, motivation. Negative Indicators: Multiple comorbidities, and history of falls.    OT Frequency  2x / week    OT Duration  12 weeks    OT Treatment/Interventions  Self-care/ADL training;Therapeutic exercise;DME and/or AE instruction;Energy conservation;Neuromuscular education;Patient/family education;Therapeutic activities    Clinical Decision Making  Several treatment options, min-mod task modification necessary    Consulted and Agree with Plan of Care  Patient       Patient will benefit from skilled therapeutic intervention in order to improve the following deficits and impairments:  Pain, Decreased strength, Decreased coordination, Decreased balance, Impaired UE functional use, Decreased safety awareness, Decreased activity tolerance, Decreased cognition, Impaired flexibility, Impaired vision/preception  Visit Diagnosis: Other lack  of coordination    Problem List Patient Active Problem List   Diagnosis Date Noted  . 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 Radicular pain in right arm 10/09/2016  . Rash 09/03/2016  . GERD (gastroesophageal reflux disease) 07/31/2016  . Trochanteric bursitis 03/03/2016  . Left hand pain 10/30/2015  . Fracture, finger, multiple sites 10/30/2015  . Colon cancer screening 07/23/2015  . Healthcare maintenance 07/23/2015  . Gout 02/18/2015  . Depression 01/06/2015  . Irritation of eyelid 08/20/2014  . Dupuytren's contracture 08/20/2014  . Cough 07/21/2014  . Lumbar stenosis with neurogenic claudication 06/13/2014  . Preop cardiovascular exam 05/28/2014  . S/P deep brain stimulator placement 05/08/2014  . Joint pain 01/22/2014  . Medicare annual wellness visit,  initial 01/19/2014  . Advance care planning 01/19/2014  . Parkinson's disease ([place]) 12/13/2013  . PTSD (post-traumatic stress disorder) 06/13/2013  . Erectile dysfunction 06/13/2013  . HLD (hyperlipidemia) 06/13/2013  . Hip pain 06/10/2013  . Pain in joint, shoulder region 06/10/2013  . Right leg swelling 06/03/2013  . Essential hypertension 06/03/2013  . Bradycardia by electrocardiogram 06/03/2013  . Obstructive sleep apnea 03/12/2013  . Spinal stenosis, lumbar region, with neurogenic claudication 12/14/2012    Harrel Carina, MS, OTR/L 09/14/2017, 10:01 AM  [place] MAIN [facility] SERVICES [place], [place], [address] Phone: [phone]   Fax:  [phone]  Name: TRON FLYTHE MRN: [id number] Date of Birth: 1950/08/13

## 2017-09-14 NOTE — Therapy (Signed)
[place] MAIN [facility] SERVICES [address] [place], [place], [address] Phone: [phone]   Fax:  [phone]  Physical Therapy Treatment/ Recertification   Patient Details  Name: [name] MRN: [id number] Date of Birth: 1950/12/13 Referring Provider: Wells Guiles Tat   Encounter Date: 09/14/2017  PT End of Session - 09/14/17 0830    Visit Number  36    Number of Visits  49    Date for PT Re-Evaluation  11/09/17    Authorization Type  8/10 of progress note    PT Start Time  0800    PT Stop Time  0840    PT Time Calculation (min)  40 min    Equipment Utilized During Treatment  Gait belt    Activity Tolerance  Patient tolerated treatment well;Patient limited by fatigue    Behavior During Therapy  [facility] for tasks assessed/performed       Past Medical History:  Diagnosis Date  . Arthritis   . Bradycardia   . Cancer [facility]) 2013   skin cancer  . Depression    ptsd  . Dysrhythmia    chronic slow heart rate  . GERD (gastroesophageal reflux disease)   . Headache(784.0)    tension headaches non recent  . History of chicken pox   . History of kidney stones    passed  . Hypertension    treated with HCTZ  . Parkinson's disease ([place])    dx'ed 15 years ago  . PTSD (post-traumatic stress disorder)   . Shortness of breath dyspnea   . Sleep apnea    doesn't use C-pap  . Varicose veins     Past Surgical History:  Procedure Laterality Date  . CHOLECYSTECTOMY N/A 10/22/2014   Procedure: LAPAROSCOPIC CHOLECYSTECTOMY WITH INTRAOPERATIVE CHOLANGIOGRAM;  Surgeon: Dia Crawford III, MD;  Location: [facility] ORS;  Service: General;  Laterality: N/A;  . cyst removed      from lip as a child  . LUMBAR LAMINECTOMY/DECOMPRESSION MICRODISCECTOMY Bilateral 12/14/2012   Procedure: Bilateral lumbar three-four, four-five decompressive laminotomy/foraminotomy;  Surgeon: Charlie Pitter, MD;  Location: [place] NEURO ORS;  Service: Neurosurgery;  Laterality: Bilateral;  .  PULSE GENERATOR IMPLANT Bilateral 12/13/2013   Procedure: Bilateral implantable pulse generator placement;  Surgeon: Erline Levine, MD;  Location: [place] NEURO ORS;  Service: Neurosurgery;  Laterality: Bilateral;  Bilateral implantable pulse generator placement  . skin cancer removed     from ears,   12 lft arm  rt leg 15  . SUBTHALAMIC STIMULATOR BATTERY REPLACEMENT Bilateral 07/14/2017   Procedure: BILATERAL IMPLANTED PULSE GENERATOR CHANGE FOR DEEP BRAIN STIMULATOR;  Surgeon: Erline Levine, MD;  Location: [place];  Service: Neurosurgery;  Laterality: Bilateral;  . SUBTHALAMIC STIMULATOR INSERTION Bilateral 12/06/2013   Procedure: SUBTHALAMIC STIMULATOR INSERTION;  Surgeon: Erline Levine, MD;  Location: [place] NEURO ORS;  Service: Neurosurgery;  Laterality: Bilateral;  Bilateral deep brain stimulator placement    There were no vitals filed for this visit.  Subjective Assessment - 09/14/17 0811    Subjective  Pt doing fine today. Reports he had a fall on the concrete 3 days ago and has scrapes on his arms.     Patient is accompained by:  Family member    Pertinent History  Patient referred for physical therapy due to wife's request for patient to get aquatic therapy to help with his conditioning. Pt is also complaining of chronic low back and R hip pain and frequent falls. 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 The last time he was in the water he was very uncomfortable because he didn't feel like he had any control. Pt has a history of 2 low back surgeries in August 2014 and February 2016. Pt and wife are unable to recall exactly what was performed but they know that he had stenosis. Surgeries were performed by Dr. Annette Stable in [place]. Pt reports that his low back and R hip pain are constant. He describes the pain in the back as "dull" and the pain in the hip as "sharp." For both back and hip worst pain is 10/10, Best: 2/10, Present: 2/10. No particular  episode of trauma to his low back or hip preceding the pain. Pt has a history of Parkinson's with a deep brain stimulator. He ambulates with a 4 wheeled walker called a U-step (automatic brakes, audio cues for stepping). He states that he falls frequently and reports that he has had approximately 40-50 falls in the last 6 months. No specific recent changes in his health.     Limitations  Walking;Standing;Sitting    How long can you sit comfortably?  30 minutes    How long can you stand comfortably?  5 minutes    How long can you walk comfortably?  10 minutes    Patient Stated Goals  Improve comfort in the water, improve balance, decrease back and R hip pain    Currently in Pain?  Yes    Pain Score  5     Pain Location  Shoulder    Pain Orientation  Right;Left    Pain Descriptors / Indicators  Aching    Pain Type  Acute pain    Pain Radiating Towards  no    Pain Onset  In the past 7 days    Aggravating Factors   nothing    Pain Relieving Factors  nothing    Effect of Pain on Daily Activities  no effect    Multiple Pain Sites  No    Pain Onset  1 to 4 weeks ago      Therapeutic activities:  Outcome measures performed and goals reviewed including Berg balance test, TUG,, 5 x sit to stand.  Patient made progress in TUG goal, and decreased his time in 5 x sit to stand and decreased his Berg balance score tp 19/56                      PT Education - 09/14/17 0813    Education provided  Yes    Education Details  safety with transfers    Person(s) Educated  Patient    Methods  Explanation    Comprehension  Verbalized understanding       PT Short Term Goals - 07/19/17 1741      PT SHORT TERM GOAL #1   Title  Pt will perform HEP with family's supervision, for improved balance, transfers, and gait.      Baseline  patient reports compliance, however not every day    Time  4    Period  Weeks    Status  Partially Met      PT SHORT TERM GOAL #2   Title  Pt will  perform 5x sit<>stand in less than or equal to 15 seconds for improved efficiency and safety with transfers.    Baseline  19 seconds; deffered at this time due to excessive LOB     Time  2    Period  Weeks    Status  On-going        PT Long Term Goals - 09/14/17 5631      PT LONG TERM GOAL #1   Title  Pt will decrease 5TSTS by at least 3 seconds in order to demonstrate clinically significant improvement in LE strength     Baseline  02/08/17: 20.1 seconds 12/18: 17 seconds ; 09/14/17 =24.34 sec with assist due to poor static standing balance    Time  8    Period  Weeks    Status  Achieved      PT LONG TERM GOAL #2   Title  Pt will decrease TUG by at least 3.4s in order to demonstrate decreased fall risk     Baseline  02/08/17: 22.1s 12/18: 20 seconds 2/7: 19 seconds; 4/3: 23 seconds with walker, 09/14/1913.12 sec    Time  8    Period  Weeks    Status  Partially Met    Target Date  11/09/17      PT LONG TERM GOAL #3   Title  Pt will improve BERG by at least 3 points in order to demonstrate clinically significant improvement in balance.    Baseline  02/08/17: 30/56 12/18: 38/56      Time  8    Period  Weeks    Status  Achieved    Target Date  --      PT LONG TERM GOAL #4   Title  Pt/wife will verbalize understanding of plans for continued community fitness upon D/C from PT including aquatic therapy program    Baseline  02/08/17: pt currently reports being more comfortable in the water    Time  8    Period  Weeks    Status  Partially Met    Target Date  11/09/17      PT LONG TERM GOAL #5   Title  Pt will decrease mODI scoreby at least 13 points in order demonstrate clinically significant reduction in pain/disability     Baseline  02/08/17: 54% 12/18: 46% 2/7: 42% 4/3; 50%, 09/14/17 42%    Time  8    Period  Weeks    Status  Partially Met    Target Date  11/09/17      PT LONG TERM GOAL #6   Title  Pt will improve BERG to 44/56  in order to demonstrate clinically significant  improvement in balance for decreased fall risk    Baseline  12/18: 38/56 2/7: 41/56 4/3: 41/56, 09/14/17  19/56    Time  8    Period  Weeks    Status  Partially Met    Target Date  11/09/17            Plan - 09/14/17 0831    Clinical Impression Statement  Patient performed outcome meausres and goals were assessed. He needs min asssit with transfers for safety and continues to have a high falls risk. Continued with current program emphasizing motor control with weight shifting and transfers, as well as more complex balance activities, both satic and dynmaic. Pt conitnues to need support for transfers both for effort and safety. Verbal cues given to improve movement quality. Pt making good progressoverall    Rehab Potential  Fair    PT Frequency  2x / week    PT Duration  8 weeks    PT Treatment/Interventions  ADLs/Self Care Home Management;Aquatic Therapy;Cryotherapy;Electrical Stimulation;Iontophoresis 71m/ml Dexamethasone;Moist Heat;Ultrasound;Contrast Bath;DME Instruction;Gait  training;Stair training;Functional mobility training;Therapeutic activities;Therapeutic exercise;Balance training;Neuromuscular re-education;Patient/family education;Manual techniques;Wheelchair mobility training;Energy conservation;Passive range of motion    PT Next Visit Plan  progress balance, strength, aquatic therapy    PT Home Exercise Plan  None provided at this time until further assessed, wife reports pt does not perform current HEP from [facility] PT    Consulted and Agree with Plan of Care  Patient       Patient will benefit from skilled therapeutic intervention in order to improve the following deficits and impairments:  Abnormal gait, Decreased balance, Decreased coordination, Decreased mobility, Impaired tone, Postural dysfunction  Visit Diagnosis: Muscle weakness (generalized)  Other lack of coordination  Repeated falls  Other abnormalities of gait and mobility  Unsteadiness on feet  Unspecified  lack of coordination  Abnormal posture     Problem List Patient Active Problem List   Diagnosis Date Noted  . 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 Radicular pain in right arm 10/09/2016  . Rash 09/03/2016  . GERD (gastroesophageal reflux disease) 07/31/2016  . Trochanteric bursitis 03/03/2016  . Left hand pain 10/30/2015  . Fracture, finger, multiple sites 10/30/2015  . Colon cancer screening 07/23/2015  . Healthcare maintenance 07/23/2015  . Gout 02/18/2015  . Depression 01/06/2015  . Irritation of eyelid 08/20/2014  . Dupuytren's contracture 08/20/2014  . Cough 07/21/2014  . Lumbar stenosis with neurogenic claudication 06/13/2014  . Preop cardiovascular exam 05/28/2014  . S/P deep brain stimulator placement 05/08/2014  . Joint pain 01/22/2014  . Medicare annual wellness visit, initial 01/19/2014  . Advance care planning 01/19/2014  . Parkinson's disease ([place]) 12/13/2013  . PTSD (post-traumatic stress disorder) 06/13/2013  . Erectile dysfunction 06/13/2013  . HLD (hyperlipidemia) 06/13/2013  . Hip pain 06/10/2013  . Pain in joint, shoulder region 06/10/2013  . Right leg swelling 06/03/2013  . Essential hypertension 06/03/2013  . Bradycardia by electrocardiogram 06/03/2013  . Obstructive sleep apnea 03/12/2013  . Spinal stenosis, lumbar region, with neurogenic claudication 12/14/2012    Alanson Puls, [place] DPT 09/14/2017, 8:47 AM  [place] MAIN [facility] SERVICES [address] [place], [place], [address] Phone: [phone]   Fax:  [phone]  Name: [name] MRN: [id number] Date of Birth: May 31, 1950

## 2017-09-15 NOTE — Progress Notes (Signed)
Agree.  Proceed as stated. Thanks.  [name] 1:49 PM 09/15/17

## 2017-09-18 ENCOUNTER — Ambulatory Visit: Payer: Medicare PPO | Attending: Neurology | Admitting: Occupational Therapy

## 2017-09-18 ENCOUNTER — Encounter: Payer: Self-pay | Admitting: Occupational Therapy

## 2017-09-18 DIAGNOSIS — R2681 Unsteadiness on feet: Secondary | ICD-10-CM | POA: Diagnosis not present

## 2017-09-18 DIAGNOSIS — R278 Other lack of coordination: Secondary | ICD-10-CM

## 2017-09-18 DIAGNOSIS — M48062 Spinal stenosis, lumbar region with neurogenic claudication: Secondary | ICD-10-CM | POA: Insufficient documentation

## 2017-09-18 DIAGNOSIS — M6281 Muscle weakness (generalized): Secondary | ICD-10-CM | POA: Diagnosis not present

## 2017-09-18 DIAGNOSIS — M545 Low back pain: Secondary | ICD-10-CM | POA: Diagnosis present

## 2017-09-18 DIAGNOSIS — G20 Parkinson's disease: Secondary | ICD-10-CM | POA: Diagnosis present

## 2017-09-18 DIAGNOSIS — G8929 Other chronic pain: Secondary | ICD-10-CM | POA: Diagnosis present

## 2017-09-18 DIAGNOSIS — R471 Dysarthria and anarthria: Secondary | ICD-10-CM | POA: Insufficient documentation

## 2017-09-18 DIAGNOSIS — R29818 Other symptoms and signs involving the nervous system: Secondary | ICD-10-CM | POA: Insufficient documentation

## 2017-09-18 DIAGNOSIS — R2689 Other abnormalities of gait and mobility: Secondary | ICD-10-CM | POA: Insufficient documentation

## 2017-09-18 DIAGNOSIS — R262 Difficulty in walking, not elsewhere classified: Secondary | ICD-10-CM | POA: Diagnosis present

## 2017-09-18 DIAGNOSIS — M25551 Pain in right hip: Secondary | ICD-10-CM | POA: Diagnosis not present

## 2017-09-18 DIAGNOSIS — R29898 Other symptoms and signs involving the musculoskeletal system: Secondary | ICD-10-CM | POA: Insufficient documentation

## 2017-09-18 DIAGNOSIS — M25559 Pain in unspecified hip: Secondary | ICD-10-CM | POA: Diagnosis not present

## 2017-09-18 DIAGNOSIS — R296 Repeated falls: Secondary | ICD-10-CM | POA: Insufficient documentation

## 2017-09-18 DIAGNOSIS — R293 Abnormal posture: Secondary | ICD-10-CM | POA: Insufficient documentation

## 2017-09-18 DIAGNOSIS — R279 Unspecified lack of coordination: Secondary | ICD-10-CM | POA: Insufficient documentation

## 2017-09-19 ENCOUNTER — Ambulatory Visit (INDEPENDENT_AMBULATORY_CARE_PROVIDER_SITE_OTHER): Payer: Medicare PPO | Admitting: Psychology

## 2017-09-19 ENCOUNTER — Ambulatory Visit: Payer: Medicare PPO

## 2017-09-19 ENCOUNTER — Other Ambulatory Visit: Payer: Self-pay

## 2017-09-19 DIAGNOSIS — R2689 Other abnormalities of gait and mobility: Secondary | ICD-10-CM

## 2017-09-19 DIAGNOSIS — F331 Major depressive disorder, recurrent, moderate: Secondary | ICD-10-CM | POA: Diagnosis not present

## 2017-09-19 DIAGNOSIS — M6281 Muscle weakness (generalized): Secondary | ICD-10-CM

## 2017-09-19 DIAGNOSIS — R278 Other lack of coordination: Secondary | ICD-10-CM | POA: Diagnosis not present

## 2017-09-19 DIAGNOSIS — R262 Difficulty in walking, not elsewhere classified: Secondary | ICD-10-CM

## 2017-09-19 DIAGNOSIS — M48062 Spinal stenosis, lumbar region with neurogenic claudication: Secondary | ICD-10-CM

## 2017-09-19 DIAGNOSIS — M25551 Pain in right hip: Secondary | ICD-10-CM | POA: Diagnosis not present

## 2017-09-19 DIAGNOSIS — R29898 Other symptoms and signs involving the musculoskeletal system: Secondary | ICD-10-CM

## 2017-09-19 DIAGNOSIS — R2681 Unsteadiness on feet: Secondary | ICD-10-CM

## 2017-09-19 DIAGNOSIS — R279 Unspecified lack of coordination: Secondary | ICD-10-CM

## 2017-09-19 DIAGNOSIS — G20A1 Parkinson's disease without dyskinesia, without mention of fluctuations: Secondary | ICD-10-CM

## 2017-09-19 DIAGNOSIS — R293 Abnormal posture: Secondary | ICD-10-CM

## 2017-09-19 DIAGNOSIS — F4323 Adjustment disorder with mixed anxiety and depressed mood: Secondary | ICD-10-CM

## 2017-09-19 DIAGNOSIS — R296 Repeated falls: Secondary | ICD-10-CM | POA: Diagnosis not present

## 2017-09-19 DIAGNOSIS — G20 Parkinson's disease: Secondary | ICD-10-CM

## 2017-09-19 DIAGNOSIS — M25559 Pain in unspecified hip: Secondary | ICD-10-CM | POA: Diagnosis not present

## 2017-09-19 NOTE — Therapy (Signed)
[place] MAIN [facility] SERVICES [address] [place], [place], [address] Phone: [phone]   Fax:  [phone]  Occupational Therapy Treatment  Patient Details  Name: [name] MRN: [id number] Date of Birth: May 11, 1950 Referring Provider: Wells Guiles Tat   Encounter Date: 09/18/2017  OT End of Session - 09/18/17 0855    Visit Number  11    Number of Visits  24    Date for OT Re-Evaluation  10/23/17    OT Start Time  0845    OT Stop Time  0940    OT Time Calculation (min)  55 min    Activity Tolerance  Patient tolerated treatment well    Behavior During Therapy  [facility] for tasks assessed/performed       Past Medical History:  Diagnosis Date  . Arthritis   . Bradycardia   . Cancer [facility]) 2013   skin cancer  . Depression    ptsd  . Dysrhythmia    chronic slow heart rate  . GERD (gastroesophageal reflux disease)   . Headache(784.0)    tension headaches non recent  . History of chicken pox   . History of kidney stones    passed  . Hypertension    treated with HCTZ  . Parkinson's disease ([place])    dx'ed 15 years ago  . PTSD (post-traumatic stress disorder)   . Shortness of breath dyspnea   . Sleep apnea    doesn't use C-pap  . Varicose veins     Past Surgical History:  Procedure Laterality Date  . CHOLECYSTECTOMY N/A 10/22/2014   Procedure: LAPAROSCOPIC CHOLECYSTECTOMY WITH INTRAOPERATIVE CHOLANGIOGRAM;  Surgeon: Dia Crawford III, MD;  Location: [facility] ORS;  Service: General;  Laterality: N/A;  . cyst removed      from lip as a child  . LUMBAR LAMINECTOMY/DECOMPRESSION MICRODISCECTOMY Bilateral 12/14/2012   Procedure: Bilateral lumbar three-four, four-five decompressive laminotomy/foraminotomy;  Surgeon: Charlie Pitter, MD;  Location: [place] NEURO ORS;  Service: Neurosurgery;  Laterality: Bilateral;  . PULSE GENERATOR IMPLANT Bilateral 12/13/2013   Procedure: Bilateral implantable pulse generator placement;  Surgeon: Erline Levine, MD;   Location: [place] NEURO ORS;  Service: Neurosurgery;  Laterality: Bilateral;  Bilateral implantable pulse generator placement  . skin cancer removed     from ears,   12 lft arm  rt leg 15  . SUBTHALAMIC STIMULATOR BATTERY REPLACEMENT Bilateral 07/14/2017   Procedure: BILATERAL IMPLANTED PULSE GENERATOR CHANGE FOR DEEP BRAIN STIMULATOR;  Surgeon: Erline Levine, MD;  Location: [place];  Service: Neurosurgery;  Laterality: Bilateral;  . SUBTHALAMIC STIMULATOR INSERTION Bilateral 12/06/2013   Procedure: SUBTHALAMIC STIMULATOR INSERTION;  Surgeon: Erline Levine, MD;  Location: [place] NEURO ORS;  Service: Neurosurgery;  Laterality: Bilateral;  Bilateral deep brain stimulator placement    There were no vitals filed for this visit.  Subjective Assessment - 09/18/17 0851    Subjective   Patient reports he has not been getting up at night this last week, cannot recall if he had his cane when he fell last week.      Pertinent History  Pt. is a 67 y.o. mlae who has a history of Parkinson's Disease. Pt. has a Deep Brain Stimulator in place. Pt. has a history of Low Back Pain with 2 back surgeries in August 2014, and February 2016. Pt. has a history of multiple frequent falls occurring at a rate of 6x's a week.     Limitations  Frequent falls, limited motor control, and [place].  Patient Stated Goals  To return home.    Currently in Pain?  Yes    Pain Score  6     Pain Location  Shoulder    Pain Orientation  Right    Pain Descriptors / Indicators  Aching    Pain Type  Acute pain    Pain Onset  In the past 7 days    Pain Frequency  Intermittent                   OT Treatments/Exercises (OP) - 09/18/17 0857      ADLs   Writing  Patient seen for handwriting skills, patient demonstrates micrographia and requires cues to enlarge letter sizes, therapist unable to read script but when patient is printing and using larger form, legibility increases.     ADL Comments  Patient seen for typing drills this date 1  min drill 1st round 3 words, second round 2 words, 5 minute drill 2 words per minute, 7 errors.        Neurological Re-education Exercises   Other Exercises 1  Patient seen for grip strengthening for sustained gripping patterns with pegs moving from board to container, grip setting on 17.9# for 25 repetitions, cues for technique, dropping items occasionally. Progressed to 23.4# for one set of 25 repetitions, dropping 5/25.  2 containers of green putty for right hand twisting forwards and backwards, resistive pronation/supination and wrist extension pulls for 10 repetitions each.               OT Education - 09/18/17 0854    Education provided  Yes    Person(s) Educated  Patient    Comprehension  Verbalized understanding;Returned demonstration;Verbal cues required    Education Details  fine motor coordination, strengthening, encouraged use of assistive device at home to help reduce fall risk    Person(s) Educated  Patient    Methods  Explanation;Demonstration;Verbal cues    Comprehension  Verbal cues required;Returned demonstration;Verbalized understanding          OT Long Term Goals - 09/14/17 0954      OT LONG TERM GOAL #1   Title  Pt. will improve Bilateral UE strength by 1 mm grade to assist with ADLs, and IADLs    Baseline  EVal: BUE strength: 4+/5 overall    Time  12    Period  Weeks    Status  On-going    Target Date  10/23/17      OT LONG TERM GOAL #2   Title  Pt. will improve grip strength to be able to pour himself a beverage.    Baseline  Eval: Pt. is unable to hold and pour a beevrage.    Time  12    Period  Weeks    Status  On-going    Target Date  10/23/17      OT LONG TERM GOAL #3   Title  Pt. will improve lateral pinch stength to be able to independently turn a key.     Baseline  Eval: Pt. has difficulty holding, and turning  key.    Time  12    Period  Weeks    Status  On-going    Target Date  10/23/17      OT LONG TERM GOAL #4   Title  Pt. will  improve Right hand [facility] skills to be able to independently grasp and use small objects needed for fixing car parts.    Baseline  Eval: Right  hand 1 min. & 5 sec. on the 9 hole peg test.    Time  12    Period  Weeks    Status  On-going    Target Date  10/23/17      OT LONG TERM GOAL #5   Title  Pt. will be demonstarte self-feeding with modified independence with minimal spillage.    Baseline  Eval: Pt. requires assistance, and has alot of spillage.    Time  12    Period  Weeks    Status  On-going    Target Date  10/23/17      Long Term Additional Goals   Additional Long Term Goals  Yes      OT LONG TERM GOAL #6   Title  Pt. will manipulate buttons, and cuff buttons independently.    Baseline  Eval: Pt. has difficulty     Time  12    Period  Weeks    Status  On-going    Target Date  10/23/17      OT LONG TERM GOAL #7   Title  Pt. will write 1 sentence efficiently with 90% legibility.    Baseline  Eval: name only 50% legibility    Time  12    Period  Weeks    Status  On-going    Target Date  10/23/17      OT LONG TERM GOAL #8   Title  Pt. will improve typing speed, and accuracy to be able to to type emails efficently    Baseline  09/14/2017: Typing speed: 3 wpm, Accuracy: 41% after 3 attempts with moderate cues for hand placement    Time  12    Period  Weeks    Status  New    Target Date  12/07/17            Plan - 09/18/17 0855    Clinical Impression Statement  Patient describes recent falls as a bad pain which shoots down his right leg and causes his knee to buckle and he goes down without warning.  Patient continues to work towards improving legibility with handwriting, utilized built up pen this date and focused on printing name, signing name and writing address.  patient with poor legibility however, legibility improves when using a built up pen and printing, cues to slow down and increase letter size.  Continued working on typing drills with fingers on home row  keys, average 2-3 words per minute and still has increased errors.  Recommended patient work on these skills at home for consistency and will continue to upgrade and adjust program during patient visits.  Continue to work towards goals in [place] to increase independence in daily tasks.     Occupational performance deficits (Please refer to evaluation for details):  ADL's;IADL's    Rehab Potential  Good    Current Impairments/barriers affecting progress:  Positive Indicators: age, familiy support, motivation. Negative Indicators: Multiple comorbidities, and history of falls.    OT Frequency  2x / week    OT Duration  12 weeks    OT Treatment/Interventions  Self-care/ADL training;Therapeutic exercise;DME and/or AE instruction;Energy conservation;Neuromuscular education;Patient/family education;Therapeutic activities    Consulted and Agree with Plan of Care  Patient       Patient will benefit from skilled therapeutic intervention in order to improve the following deficits and impairments:  Pain, Decreased strength, Decreased coordination, Decreased balance, Impaired UE functional use, Decreased safety awareness, Decreased activity tolerance, Decreased cognition, Impaired flexibility, Impaired vision/preception  Visit Diagnosis: Other lack of coordination  Muscle weakness (generalized)    Problem List Patient Active Problem List   Diagnosis Date Noted  . 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 Radicular pain in right arm 10/09/2016  . Rash 09/03/2016  . GERD (gastroesophageal reflux disease) 07/31/2016  . Trochanteric bursitis 03/03/2016  . Left hand pain 10/30/2015  . Fracture, finger, multiple sites 10/30/2015  . Colon cancer screening 07/23/2015  . Healthcare maintenance 07/23/2015  . Gout 02/18/2015  . Depression 01/06/2015  . Irritation of eyelid 08/20/2014  . Dupuytren's contracture 08/20/2014  . Cough 07/21/2014  . Lumbar stenosis with neurogenic claudication 06/13/2014  . Preop  cardiovascular exam 05/28/2014  . S/P deep brain stimulator placement 05/08/2014  . Joint pain 01/22/2014  . Medicare annual wellness visit, initial 01/19/2014  . Advance care planning 01/19/2014  . Parkinson's disease ([place]) 12/13/2013  . PTSD (post-traumatic stress disorder) 06/13/2013  . Erectile dysfunction 06/13/2013  . HLD (hyperlipidemia) 06/13/2013  . Hip pain 06/10/2013  . Pain in joint, shoulder region 06/10/2013  . Right leg swelling 06/03/2013  . Essential hypertension 06/03/2013  . Bradycardia by electrocardiogram 06/03/2013  . Obstructive sleep apnea 03/12/2013  . Spinal stenosis, lumbar region, with neurogenic claudication 12/14/2012   Achilles Dunk, OTR/L, CLT  Carley Glendenning 09/19/2017, 10:59 AM  [place] MAIN [facility] SERVICES [place], [place], [address] Phone: [phone]   Fax:  [phone]  Name: [name] MRN: [id number] Date of Birth: [birth year]

## 2017-09-19 NOTE — Therapy (Signed)
[place] MAIN [facility] SERVICES [address] [place], [place], [address] Phone: [phone]   Fax:  [phone]  Physical Therapy Treatment  Patient Details  Name: [name] MRN: [id number] Date of Birth: September 28, 1950 Referring Provider: Wells Guiles Tat   Encounter Date: 09/19/2017  PT End of Session - 09/19/17 1456    Visit Number  37    Number of Visits  49    Date for PT Re-Evaluation  11/09/17    PT Start Time  0930    PT Stop Time  1025    PT Time Calculation (min)  55 min    Activity Tolerance  Patient tolerated treatment well    Behavior During Therapy  [facility] for tasks assessed/performed       Past Medical History:  Diagnosis Date  . Arthritis   . Bradycardia   . Cancer [facility]) 2013   skin cancer  . Depression    ptsd  . Dysrhythmia    chronic slow heart rate  . GERD (gastroesophageal reflux disease)   . Headache(784.0)    tension headaches non recent  . History of chicken pox   . History of kidney stones    passed  . Hypertension    treated with HCTZ  . Parkinson's disease ([place])    dx'ed 15 years ago  . PTSD (post-traumatic stress disorder)   . Shortness of breath dyspnea   . Sleep apnea    doesn't use C-pap  . Varicose veins     Past Surgical History:  Procedure Laterality Date  . CHOLECYSTECTOMY N/A 10/22/2014   Procedure: LAPAROSCOPIC CHOLECYSTECTOMY WITH INTRAOPERATIVE CHOLANGIOGRAM;  Surgeon: Dia Crawford III, MD;  Location: [facility] ORS;  Service: General;  Laterality: N/A;  . cyst removed      from lip as a child  . LUMBAR LAMINECTOMY/DECOMPRESSION MICRODISCECTOMY Bilateral 12/14/2012   Procedure: Bilateral lumbar three-four, four-five decompressive laminotomy/foraminotomy;  Surgeon: Charlie Pitter, MD;  Location: [place] NEURO ORS;  Service: Neurosurgery;  Laterality: Bilateral;  . PULSE GENERATOR IMPLANT Bilateral 12/13/2013   Procedure: Bilateral implantable pulse generator placement;  Surgeon: Erline Levine, MD;  Location:  [place] NEURO ORS;  Service: Neurosurgery;  Laterality: Bilateral;  Bilateral implantable pulse generator placement  . skin cancer removed     from ears,   12 lft arm  rt leg 15  . SUBTHALAMIC STIMULATOR BATTERY REPLACEMENT Bilateral 07/14/2017   Procedure: BILATERAL IMPLANTED PULSE GENERATOR CHANGE FOR DEEP BRAIN STIMULATOR;  Surgeon: Erline Levine, MD;  Location: [place];  Service: Neurosurgery;  Laterality: Bilateral;  . SUBTHALAMIC STIMULATOR INSERTION Bilateral 12/06/2013   Procedure: SUBTHALAMIC STIMULATOR INSERTION;  Surgeon: Erline Levine, MD;  Location: [place] NEURO ORS;  Service: Neurosurgery;  Laterality: Bilateral;  Bilateral deep brain stimulator placement    There were no vitals filed for this visit.  Subjective Assessment - 09/19/17 1453    Subjective  Pt reports using w/c more, but does continue to use cane in home. Reports an increase of falls. Eager to return to aquatic PT; feels balance has worsened since not participating in aquatic PT.    Patient is accompained by:  Family member    Pertinent History  Patient referred for physical therapy due to wife's request for patient to get aquatic therapy to help with his conditioning. Pt is also complaining of chronic low back and R hip pain and frequent falls. 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 The  last time he was in the water he was very uncomfortable because he didn't feel like he had any control. Pt has a history of 2 low back surgeries in August 2014 and February 2016. Pt and wife are unable to recall exactly what was performed but they know that he had stenosis. Surgeries were performed by Dr. Annette Stable in [place]. Pt reports that his low back and R hip pain are constant. He describes the pain in the back as "dull" and the pain in the hip as "sharp." For both back and hip worst pain is 10/10, Best: 2/10, Present: 2/10. No particular episode of trauma to his low back or hip preceding the  pain. Pt has a history of Parkinson's with a deep brain stimulator. He ambulates with a 4 wheeled walker called a U-step (automatic brakes, audio cues for stepping). He states that he falls frequently and reports that he has had approximately 40-50 falls in the last 6 months. No specific recent changes in his health.       Enters/exits pool via ramp  * Requires CGA/Min A throughout most of session. Place pt in front of bench if unable to assist hands on and maintain close by assist.  Ambulation, blue dumbbells  4 L fwd  4 L side, encourage toes fwd and true side stepping (trunk and gaze)  LE strength at rail  Squat 5x (review)  Squat with noodle under buttocks, 20x   Balance  High knee (noodle support) ambulation, 2 L    Core with UE work at wall (wall squat position)  Red dumbbells, 20x ea   Triceps press downs   Sh abd/add   Sh flex/ext   Horiz abd/add  LE strength at bench  R/L hip ext with noodle, 20x ea    Core/balance work  Axe chop up R to down L with noodle, 20x  Axe chop up L to down R with noodle, 20x  Sword fight swings with dynamic fwd/bkwd stepping, 5 min                         PT Education - 09/19/17 1454    Education provided  Yes    Education Details  Review of postural importance with walking/exercise in the pool, core engagement/bracing with exercise/activity. Use of focal points.     Person(s) Educated  Patient    Methods  Explanation;Demonstration;Verbal cues;Tactile cues    Comprehension  Verbalized understanding;Returned demonstration;Verbal cues required;Tactile cues required       PT Short Term Goals - 07/19/17 1741      PT SHORT TERM GOAL #1   Title  Pt will perform HEP with family's supervision, for improved balance, transfers, and gait.      Baseline  patient reports compliance, however not every day    Time  4    Period  Weeks    Status  Partially Met      PT SHORT TERM GOAL #2   Title  Pt will perform 5x  sit<>stand in less than or equal to 15 seconds for improved efficiency and safety with transfers.    Baseline  19 seconds; deffered at this time due to excessive LOB     Time  2    Period  Weeks    Status  On-going        PT Long Term Goals - 09/14/17 1572      PT LONG TERM GOAL #1   Title  Pt will  decrease 5TSTS by at least 3 seconds in order to demonstrate clinically significant improvement in LE strength     Baseline  02/08/17: 20.1 seconds 12/18: 17 seconds ; 09/14/17 =24.34 sec with assist due to poor static standing balance    Time  8    Period  Weeks    Status  Achieved      PT LONG TERM GOAL #2   Title  Pt will decrease TUG by at least 3.4s in order to demonstrate decreased fall risk     Baseline  02/08/17: 22.1s 12/18: 20 seconds 2/7: 19 seconds; 4/3: 23 seconds with walker, 09/14/1913.12 sec    Time  8    Period  Weeks    Status  Partially Met    Target Date  11/09/17      PT LONG TERM GOAL #3   Title  Pt will improve BERG by at least 3 points in order to demonstrate clinically significant improvement in balance.    Baseline  02/08/17: 30/56 12/18: 38/56      Time  8    Period  Weeks    Status  Achieved    Target Date  --      PT LONG TERM GOAL #4   Title  Pt/wife will verbalize understanding of plans for continued community fitness upon D/C from PT including aquatic therapy program    Baseline  02/08/17: pt currently reports being more comfortable in the water    Time  8    Period  Weeks    Status  Partially Met    Target Date  11/09/17      PT LONG TERM GOAL #5   Title  Pt will decrease mODI scoreby at least 13 points in order demonstrate clinically significant reduction in pain/disability     Baseline  02/08/17: 54% 12/18: 46% 2/7: 42% 4/3; 50%, 09/14/17 42%    Time  8    Period  Weeks    Status  Partially Met    Target Date  11/09/17      PT LONG TERM GOAL #6   Title  Pt will improve BERG to 44/56  in order to demonstrate clinically significant  improvement in balance for decreased fall risk    Baseline  12/18: 38/56 2/7: 41/56 4/3: 41/56, 09/14/17  19/56    Time  8    Period  Weeks    Status  Partially Met    Target Date  11/09/17            Plan - 09/19/17 1456    Clinical Impression Statement  Pt arrives 25 min early and able to begin 15 min early once ready for session. (spouse assists pt before and after session in locker room). Pt demonstrating increased weakness on R side of body with core/LE stability with decreased control in positions of stabilization. Hip extensors on L noted to be weaker than R (LLE was the noted weaker side throughout body previously) CGA/Min A throughout session for balance in stable and dynamic activity. Continue to progress core/LE strength and balance to improve function and decrease falls.     Rehab Potential  Fair    PT Frequency  2x / week    PT Duration  8 weeks    PT Treatment/Interventions  ADLs/Self Care Home Management;Aquatic Therapy;Cryotherapy;Electrical Stimulation;Iontophoresis '4mg'$ /ml Dexamethasone;Moist Heat;Ultrasound;Contrast Bath;DME Instruction;Gait training;Stair training;Functional mobility training;Therapeutic activities;Therapeutic exercise;Balance training;Neuromuscular re-education;Patient/family education;Manual techniques;Wheelchair mobility training;Energy conservation;Passive range of motion    PT Next Visit Plan  progress balance, strength, aquatic therapy    PT Home Exercise Plan  None provided at this time until further assessed, wife reports pt does not perform current HEP from [facility] PT    Consulted and Agree with Plan of Care  Patient       Patient will benefit from skilled therapeutic intervention in order to improve the following deficits and impairments:  Abnormal gait, Decreased balance, Decreased coordination, Decreased mobility, Impaired tone, Postural dysfunction  Visit Diagnosis: Other lack of coordination  Muscle weakness (generalized)  Repeated  falls  Other abnormalities of gait and mobility  Unsteadiness on feet  Unspecified lack of coordination  Abnormal posture  Difficulty walking  Parkinson's disease ([facility])  Spinal stenosis, lumbar region, with neurogenic claudication  Other symptoms and signs involving the musculoskeletal system     Problem List Patient Active Problem List   Diagnosis Date Noted  . 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 Radicular pain in right arm 10/09/2016  . Rash 09/03/2016  . GERD (gastroesophageal reflux disease) 07/31/2016  . Trochanteric bursitis 03/03/2016  . Left hand pain 10/30/2015  . Fracture, finger, multiple sites 10/30/2015  . Colon cancer screening 07/23/2015  . Healthcare maintenance 07/23/2015  . Gout 02/18/2015  . Depression 01/06/2015  . Irritation of eyelid 08/20/2014  . Dupuytren's contracture 08/20/2014  . Cough 07/21/2014  . Lumbar stenosis with neurogenic claudication 06/13/2014  . Preop cardiovascular exam 05/28/2014  . S/P deep brain stimulator placement 05/08/2014  . Joint pain 01/22/2014  . Medicare annual wellness visit, initial 01/19/2014  . Advance care planning 01/19/2014  . Parkinson's disease ([place]) 12/13/2013  . PTSD (post-traumatic stress disorder) 06/13/2013  . Erectile dysfunction 06/13/2013  . HLD (hyperlipidemia) 06/13/2013  . Hip pain 06/10/2013  . Pain in joint, shoulder region 06/10/2013  . Right leg swelling 06/03/2013  . Essential hypertension 06/03/2013  . Bradycardia by electrocardiogram 06/03/2013  . Obstructive sleep apnea 03/12/2013  . Spinal stenosis, lumbar region, with neurogenic claudication 12/14/2012    Larae Grooms 09/19/2017, 3:04 PM  [place] MAIN [facility] SERVICES [place], [place], [address] Phone: [phone]   Fax:  [phone]  Name: [name] MRN: [id number] Date of Birth: [birth year]

## 2017-09-21 ENCOUNTER — Ambulatory Visit: Payer: Medicare PPO | Admitting: Occupational Therapy

## 2017-09-21 ENCOUNTER — Ambulatory Visit: Payer: Self-pay

## 2017-09-26 ENCOUNTER — Other Ambulatory Visit: Payer: Self-pay

## 2017-09-26 ENCOUNTER — Ambulatory Visit: Payer: Medicare PPO

## 2017-09-26 DIAGNOSIS — M6281 Muscle weakness (generalized): Secondary | ICD-10-CM | POA: Diagnosis not present

## 2017-09-26 DIAGNOSIS — M25559 Pain in unspecified hip: Secondary | ICD-10-CM | POA: Diagnosis not present

## 2017-09-26 DIAGNOSIS — R2681 Unsteadiness on feet: Secondary | ICD-10-CM | POA: Diagnosis not present

## 2017-09-26 DIAGNOSIS — R279 Unspecified lack of coordination: Secondary | ICD-10-CM

## 2017-09-26 DIAGNOSIS — R2689 Other abnormalities of gait and mobility: Secondary | ICD-10-CM | POA: Diagnosis not present

## 2017-09-26 DIAGNOSIS — R296 Repeated falls: Secondary | ICD-10-CM

## 2017-09-26 DIAGNOSIS — R278 Other lack of coordination: Secondary | ICD-10-CM

## 2017-09-26 DIAGNOSIS — R262 Difficulty in walking, not elsewhere classified: Secondary | ICD-10-CM

## 2017-09-26 DIAGNOSIS — M25551 Pain in right hip: Secondary | ICD-10-CM | POA: Diagnosis not present

## 2017-09-26 DIAGNOSIS — G20 Parkinson's disease: Secondary | ICD-10-CM

## 2017-09-26 DIAGNOSIS — R293 Abnormal posture: Secondary | ICD-10-CM

## 2017-09-26 DIAGNOSIS — M48062 Spinal stenosis, lumbar region with neurogenic claudication: Secondary | ICD-10-CM

## 2017-09-26 DIAGNOSIS — R29898 Other symptoms and signs involving the musculoskeletal system: Secondary | ICD-10-CM

## 2017-09-26 DIAGNOSIS — G20A1 Parkinson's disease without dyskinesia, without mention of fluctuations: Secondary | ICD-10-CM

## 2017-09-26 NOTE — Therapy (Signed)
[place] MAIN [facility] SERVICES [address] [place], [place], [address] Phone: [phone]   Fax:  [phone]  Physical Therapy Treatment  Patient Details  Name: [name] MRN: [id number] Date of Birth: [birth year] Referring Provider: Wells Guiles Tat   Encounter Date: 09/26/2017  PT End of Session - 09/26/17 1503    Visit Number  38    Number of Visits  49    Date for PT Re-Evaluation  11/09/17    PT Start Time  1000    PT Stop Time  1100    PT Time Calculation (min)  60 min    Activity Tolerance  Patient tolerated treatment well    Behavior During Therapy  [facility] for tasks assessed/performed       Past Medical History:  Diagnosis Date  . Arthritis   . Bradycardia   . Cancer [facility]) 2013   skin cancer  . Depression    ptsd  . Dysrhythmia    chronic slow heart rate  . GERD (gastroesophageal reflux disease)   . Headache(784.0)    tension headaches non recent  . History of chicken pox   . History of kidney stones    passed  . Hypertension    treated with HCTZ  . Parkinson's disease ([place])    dx'ed 15 years ago  . PTSD (post-traumatic stress disorder)   . Shortness of breath dyspnea   . Sleep apnea    doesn't use C-pap  . Varicose veins     Past Surgical History:  Procedure Laterality Date  . CHOLECYSTECTOMY N/A 10/22/2014   Procedure: LAPAROSCOPIC CHOLECYSTECTOMY WITH INTRAOPERATIVE CHOLANGIOGRAM;  Surgeon: Dia Crawford III, MD;  Location: [facility] ORS;  Service: General;  Laterality: N/A;  . cyst removed      from lip as a child  . LUMBAR LAMINECTOMY/DECOMPRESSION MICRODISCECTOMY Bilateral 12/14/2012   Procedure: Bilateral lumbar three-four, four-five decompressive laminotomy/foraminotomy;  Surgeon: Charlie Pitter, MD;  Location: [place] NEURO ORS;  Service: Neurosurgery;  Laterality: Bilateral;  . PULSE GENERATOR IMPLANT Bilateral 12/13/2013   Procedure: Bilateral implantable pulse generator placement;  Surgeon: Erline Levine, MD;  Location:  [place] NEURO ORS;  Service: Neurosurgery;  Laterality: Bilateral;  Bilateral implantable pulse generator placement  . skin cancer removed     from ears,   12 lft arm  rt leg 15  . SUBTHALAMIC STIMULATOR BATTERY REPLACEMENT Bilateral 07/14/2017   Procedure: BILATERAL IMPLANTED PULSE GENERATOR CHANGE FOR DEEP BRAIN STIMULATOR;  Surgeon: Erline Levine, MD;  Location: [place];  Service: Neurosurgery;  Laterality: Bilateral;  . SUBTHALAMIC STIMULATOR INSERTION Bilateral 12/06/2013   Procedure: SUBTHALAMIC STIMULATOR INSERTION;  Surgeon: Erline Levine, MD;  Location: [place] NEURO ORS;  Service: Neurosurgery;  Laterality: Bilateral;  Bilateral deep brain stimulator placement    There were no vitals filed for this visit.  Subjective Assessment - 09/26/17 1500    Subjective  Reports feeling "fine" after last session (first aquatic since returning); denies too much fatigue. Pt/spouse report pt falling into bushes yesterday without injury. Denies pain currently.     Patient is accompained by:  Family member    Pertinent History  Patient referred for physical therapy due to wife's request for patient to get aquatic therapy to help with his conditioning. Pt is also complaining of chronic low back and R hip pain and frequent falls. 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 The last time he was in the water  he was very uncomfortable because he didn't feel like he had any control. Pt has a history of 2 low back surgeries in August 2014 and February 2016. Pt and wife are unable to recall exactly what was performed but they know that he had stenosis. Surgeries were performed by Dr. Annette Stable in [place]. Pt reports that his low back and R hip pain are constant. He describes the pain in the back as "dull" and the pain in the hip as "sharp." For both back and hip worst pain is 10/10, Best: 2/10, Present: 2/10. No particular episode of trauma to his low back or hip preceding the  pain. Pt has a history of Parkinson's with a deep brain stimulator. He ambulates with a 4 wheeled walker called a U-step (automatic brakes, audio cues for stepping). He states that he falls frequently and reports that he has had approximately 40-50 falls in the last 6 months. No specific recent changes in his health.       Enter/exits via ramp  Ambulation, blue dumbbells  Fwd 4 L   Side 2 L   Side with squat 2 L   High knee march 2 L  Bkwd 2 L  LE strength  Resisted squat with super noodle, 25x  LE/core strength and balance, bench  STS no UE, 20x  Stand ball toss/catch, 5 min  Stand trunk rotation with ball, R to L and L to R, 15x ea  Core with UE strength  Green dumbbells, 15x ea   Triceps press down   Sh abd/add    Sh flex/ext   Sh horiz abd/add                             PT Education - 09/26/17 1502    Education provided  Yes    Education Details  sidestep with squat, posture with walking especially bkwd walk, resisted squat, trunk rotation with ball    Person(s) Educated  Patient    Methods  Explanation;Demonstration;Tactile cues;Verbal cues    Comprehension  Verbalized understanding;Returned demonstration;Verbal cues required;Tactile cues required       PT Short Term Goals - 07/19/17 1741      PT SHORT TERM GOAL #1   Title  Pt will perform HEP with family's supervision, for improved balance, transfers, and gait.      Baseline  patient reports compliance, however not every day    Time  4    Period  Weeks    Status  Partially Met      PT SHORT TERM GOAL #2   Title  Pt will perform 5x sit<>stand in less than or equal to 15 seconds for improved efficiency and safety with transfers.    Baseline  19 seconds; deffered at this time due to excessive LOB     Time  2    Period  Weeks    Status  On-going        PT Long Term Goals - 09/14/17 5573      PT LONG TERM GOAL #1   Title  Pt will decrease 5TSTS by at least 3 seconds in order to  demonstrate clinically significant improvement in LE strength     Baseline  02/08/17: 20.1 seconds 12/18: 17 seconds ; 09/14/17 =24.34 sec with assist due to poor static standing balance    Time  8    Period  Weeks    Status  Achieved  PT LONG TERM GOAL #2   Title  Pt will decrease TUG by at least 3.4s in order to demonstrate decreased fall risk     Baseline  02/08/17: 22.1s 12/18: 20 seconds 2/7: 19 seconds; 4/3: 23 seconds with walker, 09/14/1913.12 sec    Time  8    Period  Weeks    Status  Partially Met    Target Date  11/09/17      PT LONG TERM GOAL #3   Title  Pt will improve BERG by at least 3 points in order to demonstrate clinically significant improvement in balance.    Baseline  02/08/17: 30/56 12/18: 38/56      Time  8    Period  Weeks    Status  Achieved    Target Date  --      PT LONG TERM GOAL #4   Title  Pt/wife will verbalize understanding of plans for continued community fitness upon D/C from PT including aquatic therapy program    Baseline  02/08/17: pt currently reports being more comfortable in the water    Time  8    Period  Weeks    Status  Partially Met    Target Date  11/09/17      PT LONG TERM GOAL #5   Title  Pt will decrease mODI scoreby at least 13 points in order demonstrate clinically significant reduction in pain/disability     Baseline  02/08/17: 54% 12/18: 46% 2/7: 42% 4/3; 50%, 09/14/17 42%    Time  8    Period  Weeks    Status  Partially Met    Target Date  11/09/17      PT LONG TERM GOAL #6   Title  Pt will improve BERG to 44/56  in order to demonstrate clinically significant improvement in balance for decreased fall risk    Baseline  12/18: 38/56 2/7: 41/56 4/3: 41/56, 09/14/17  19/56    Time  8    Period  Weeks    Status  Partially Met    Target Date  11/09/17            Plan - 09/26/17 1504    Clinical Impression Statement  Pt with noted mild increased in balance difficulties today and moderate lean to L often even with  wall supported exercises. Encouraged righting with verbal cues and tactile cues throughout. Needs constant education/cueing to slow/stop movement when losing balance to regroup. Works well (effort) despite throughout session    Rehab Potential  Fair    PT Frequency  2x / week    PT Duration  8 weeks    PT Treatment/Interventions  ADLs/Self Care Home Management;Aquatic Therapy;Cryotherapy;Electrical Stimulation;Iontophoresis '4mg'$ /ml Dexamethasone;Moist Heat;Ultrasound;Contrast Bath;DME Instruction;Gait training;Stair training;Functional mobility training;Therapeutic activities;Therapeutic exercise;Balance training;Neuromuscular re-education;Patient/family education;Manual techniques;Wheelchair mobility training;Energy conservation;Passive range of motion    PT Next Visit Plan  progress balance, strength, aquatic therapy    PT Home Exercise Plan  None provided at this time until further assessed, wife reports pt does not perform current HEP from [facility] PT    Consulted and Agree with Plan of Care  Patient       Patient will benefit from skilled therapeutic intervention in order to improve the following deficits and impairments:  Abnormal gait, Decreased balance, Decreased coordination, Decreased mobility, Impaired tone, Postural dysfunction  Visit Diagnosis: Other lack of coordination  Muscle weakness (generalized)  Repeated falls  Other abnormalities of gait and mobility  Unsteadiness on feet  Unspecified lack  of coordination  Abnormal posture  Difficulty walking  Parkinson's disease ([place])  Spinal stenosis, lumbar region, with neurogenic claudication  Other symptoms and signs involving the musculoskeletal system     Problem List Patient Active Problem List   Diagnosis Date Noted  . 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 Radicular pain in right arm 10/09/2016  . Rash 09/03/2016  . GERD (gastroesophageal reflux disease) 07/31/2016  . Trochanteric bursitis 03/03/2016  . Left hand  pain 10/30/2015  . Fracture, finger, multiple sites 10/30/2015  . Colon cancer screening 07/23/2015  . Healthcare maintenance 07/23/2015  . Gout 02/18/2015  . Depression 01/06/2015  . Irritation of eyelid 08/20/2014  . Dupuytren's contracture 08/20/2014  . Cough 07/21/2014  . Lumbar stenosis with neurogenic claudication 06/13/2014  . Preop cardiovascular exam 05/28/2014  . S/P deep brain stimulator placement 05/08/2014  . Joint pain 01/22/2014  . Medicare annual wellness visit, initial 01/19/2014  . Advance care planning 01/19/2014  . Parkinson's disease ([place]) 12/13/2013  . PTSD (post-traumatic stress disorder) 06/13/2013  . Erectile dysfunction 06/13/2013  . HLD (hyperlipidemia) 06/13/2013  . Hip pain 06/10/2013  . Pain in joint, shoulder region 06/10/2013  . Right leg swelling 06/03/2013  . Essential hypertension 06/03/2013  . Bradycardia by electrocardiogram 06/03/2013  . Obstructive sleep apnea 03/12/2013  . Spinal stenosis, lumbar region, with neurogenic claudication 12/14/2012    Larae Grooms 09/26/2017, 3:08 PM  [place] MAIN [facility] SERVICES [place], [place], [address] Phone: [phone]   Fax:  [phone]  Name: KHALE NIGH MRN: [id number] Date of Birth: 10/29/1950

## 2017-09-27 ENCOUNTER — Ambulatory Visit: Payer: Medicare PPO

## 2017-09-27 ENCOUNTER — Ambulatory Visit: Payer: Self-pay | Admitting: Physical Therapy

## 2017-09-27 DIAGNOSIS — M25559 Pain in unspecified hip: Secondary | ICD-10-CM | POA: Diagnosis not present

## 2017-09-27 DIAGNOSIS — R278 Other lack of coordination: Secondary | ICD-10-CM

## 2017-09-27 DIAGNOSIS — R2681 Unsteadiness on feet: Secondary | ICD-10-CM | POA: Diagnosis not present

## 2017-09-27 DIAGNOSIS — M25551 Pain in right hip: Secondary | ICD-10-CM | POA: Diagnosis not present

## 2017-09-27 DIAGNOSIS — M6281 Muscle weakness (generalized): Secondary | ICD-10-CM

## 2017-09-27 DIAGNOSIS — R293 Abnormal posture: Secondary | ICD-10-CM | POA: Diagnosis not present

## 2017-09-27 DIAGNOSIS — R296 Repeated falls: Secondary | ICD-10-CM

## 2017-09-27 DIAGNOSIS — R279 Unspecified lack of coordination: Secondary | ICD-10-CM | POA: Diagnosis not present

## 2017-09-27 DIAGNOSIS — R2689 Other abnormalities of gait and mobility: Secondary | ICD-10-CM

## 2017-09-27 NOTE — Therapy (Signed)
[place] MAIN [facility] SERVICES [address] [place], [place], [address] Phone: [phone]   Fax:  [phone]  Physical Therapy Treatment  Patient Details  Name: [name] MRN: [id number] Date of Birth: 12/22/1950 Referring Provider: Wells Guiles Tat   Encounter Date: 09/27/2017  PT End of Session - 09/27/17 0852    Visit Number  39    Number of Visits  49    Date for PT Re-Evaluation  11/09/17    Authorization Type  3/10    PT Start Time  0845    PT Stop Time  0929    PT Time Calculation (min)  44 min    Activity Tolerance  Patient tolerated treatment well    Behavior During Therapy  [facility] for tasks assessed/performed       Past Medical History:  Diagnosis Date  . Arthritis   . Bradycardia   . Cancer [facility]) 2013   skin cancer  . Depression    ptsd  . Dysrhythmia    chronic slow heart rate  . GERD (gastroesophageal reflux disease)   . Headache(784.0)    tension headaches non recent  . History of chicken pox   . History of kidney stones    passed  . Hypertension    treated with HCTZ  . Parkinson's disease ([place])    dx'ed 15 years ago  . PTSD (post-traumatic stress disorder)   . Shortness of breath dyspnea   . Sleep apnea    doesn't use C-pap  . Varicose veins     Past Surgical History:  Procedure Laterality Date  . CHOLECYSTECTOMY N/A 10/22/2014   Procedure: LAPAROSCOPIC CHOLECYSTECTOMY WITH INTRAOPERATIVE CHOLANGIOGRAM;  Surgeon: Dia Crawford III, MD;  Location: [facility] ORS;  Service: General;  Laterality: N/A;  . cyst removed      from lip as a child  . LUMBAR LAMINECTOMY/DECOMPRESSION MICRODISCECTOMY Bilateral 12/14/2012   Procedure: Bilateral lumbar three-four, four-five decompressive laminotomy/foraminotomy;  Surgeon: Charlie Pitter, MD;  Location: [place] NEURO ORS;  Service: Neurosurgery;  Laterality: Bilateral;  . PULSE GENERATOR IMPLANT Bilateral 12/13/2013   Procedure: Bilateral implantable pulse generator placement;  Surgeon:  Erline Levine, MD;  Location: [place] NEURO ORS;  Service: Neurosurgery;  Laterality: Bilateral;  Bilateral implantable pulse generator placement  . skin cancer removed     from ears,   12 lft arm  rt leg 15  . SUBTHALAMIC STIMULATOR BATTERY REPLACEMENT Bilateral 07/14/2017   Procedure: BILATERAL IMPLANTED PULSE GENERATOR CHANGE FOR DEEP BRAIN STIMULATOR;  Surgeon: Erline Levine, MD;  Location: [place];  Service: Neurosurgery;  Laterality: Bilateral;  . SUBTHALAMIC STIMULATOR INSERTION Bilateral 12/06/2013   Procedure: SUBTHALAMIC STIMULATOR INSERTION;  Surgeon: Erline Levine, MD;  Location: [place] NEURO ORS;  Service: Neurosurgery;  Laterality: Bilateral;  Bilateral deep brain stimulator placement    There were no vitals filed for this visit.  Subjective Assessment - 09/27/17 0850    Subjective  Patient's wife reports pt. fell into bushes two days ago. patient reports soreness in hip and shoulder from fall. Pt. reports he is happy to be returning to aquatic PT.     Patient is accompained by:  Family member    Pertinent History  Patient referred for physical therapy due to wife's request for patient to get aquatic therapy to help with his conditioning. Pt is also complaining of chronic low back and R hip pain and frequent falls. 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 The last time he was in the water he was very uncomfortable because he didn't feel like he had any control. Pt has a history of 2 low back surgeries in August 2014 and February 2016. Pt and wife are unable to recall exactly what was performed but they know that he had stenosis. Surgeries were performed by Dr. Annette Stable in [place]. Pt reports that his low back and R hip pain are constant. He describes the pain in the back as "dull" and the pain in the hip as "sharp." For both back and hip worst pain is 10/10, Best: 2/10, Present: 2/10. No particular episode of trauma to his low back or hip  preceding the pain. Pt has a history of Parkinson's with a deep brain stimulator. He ambulates with a 4 wheeled walker called a U-step (automatic brakes, audio cues for stepping). He states that he falls frequently and reports that he has had approximately 40-50 falls in the last 6 months. No specific recent changes in his health.     Currently in Pain?  Yes    Pain Score  4     Pain Location  Hip    Pain Orientation  Right    Pain Descriptors / Indicators  Aching    Pain Type  Acute pain    Pain Onset  In the past 7 days    Pain Frequency  Intermittent         -Nustep L4, 4 minutes for warm-up (unbilled);  -Standing mini squats x 15 with BUE support;  -Standing marching 5# ankle weight x 15 bilateral, BUE support;  -Standing abduction 5# ankle weight x 15 bilateral, BUE support;  -Standing hamstring curl 5# ankle weight x 15 bilateral, BUE support;  -Standing hip extension 5# ankle weight x 15 bilateral, BUE support;  -Standing hip flexion with straight leg 5# ankle weight x 15 bilateral, BUE support  -seated LAQ 10x each leg 3 second holds   -seated adduction squeezes 15x   Neuromuscular Re-education -6" step toe taps 15x each leg: cues for where to place foot and frequency of taps with verbal cues of L, then R to set pace. CGA with decreased UE support to single UE support.   6" step step-ups alternating LE x 10 each, verbal cues for L and R for pace.   -Airex pad balance with feet apart eyes closed 5 x 30s,       Pt. response to medical necessity: Patient will continue to benefit from skilled physical therapy to improve static and dynamic standing balance and decrease fall risk for improved functional capacity of activities                       PT Education - 09/27/17 0852    Education provided  Yes    Education Details  exercise technique, proper transfer body mechanics for safety     Person(s) Educated  Patient    Methods   Explanation;Demonstration;Tactile cues;Verbal cues    Comprehension  Verbalized understanding;Need further instruction;Returned demonstration;Verbal cues required       PT Short Term Goals - 07/19/17 1741      PT SHORT TERM GOAL #1   Title  Pt will perform HEP with family's supervision, for improved balance, transfers, and gait.      Baseline  patient reports compliance, however not every day    Time  4    Period  Weeks    Status  Partially Met  PT SHORT TERM GOAL #2   Title  Pt will perform 5x sit<>stand in less than or equal to 15 seconds for improved efficiency and safety with transfers.    Baseline  19 seconds; deffered at this time due to excessive LOB     Time  2    Period  Weeks    Status  On-going        PT Long Term Goals - 09/14/17 2951      PT LONG TERM GOAL #1   Title  Pt will decrease 5TSTS by at least 3 seconds in order to demonstrate clinically significant improvement in LE strength     Baseline  02/08/17: 20.1 seconds 12/18: 17 seconds ; 09/14/17 =24.34 sec with assist due to poor static standing balance    Time  8    Period  Weeks    Status  Achieved      PT LONG TERM GOAL #2   Title  Pt will decrease TUG by at least 3.4s in order to demonstrate decreased fall risk     Baseline  02/08/17: 22.1s 12/18: 20 seconds 2/7: 19 seconds; 4/3: 23 seconds with walker, 09/14/1913.12 sec    Time  8    Period  Weeks    Status  Partially Met    Target Date  11/09/17      PT LONG TERM GOAL #3   Title  Pt will improve BERG by at least 3 points in order to demonstrate clinically significant improvement in balance.    Baseline  02/08/17: 30/56 12/18: 38/56      Time  8    Period  Weeks    Status  Achieved    Target Date  --      PT LONG TERM GOAL #4   Title  Pt/wife will verbalize understanding of plans for continued community fitness upon D/C from PT including aquatic therapy program    Baseline  02/08/17: pt currently reports being more comfortable in the water     Time  8    Period  Weeks    Status  Partially Met    Target Date  11/09/17      PT LONG TERM GOAL #5   Title  Pt will decrease mODI scoreby at least 13 points in order demonstrate clinically significant reduction in pain/disability     Baseline  02/08/17: 54% 12/18: 46% 2/7: 42% 4/3; 50%, 09/14/17 42%    Time  8    Period  Weeks    Status  Partially Met    Target Date  11/09/17      PT LONG TERM GOAL #6   Title  Pt will improve BERG to 44/56  in order to demonstrate clinically significant improvement in balance for decreased fall risk    Baseline  12/18: 38/56 2/7: 41/56 4/3: 41/56, 09/14/17  19/56    Time  8    Period  Weeks    Status  Partially Met    Target Date  11/09/17            Plan - 09/27/17 0904    Clinical Impression Statement  Patient's excessive posterior lean results in posterior LOB when performing transfers requiring verbal and tactile cueing for maintaining COM. Patient challenged with coordination and sequencing of task with LLE more than RLE this session. Patient will continue to benefit from skilled physical therapy to improve static and dynamic standing balance and decrease fall risk for improved functional capacity of activities  Rehab Potential  Fair    PT Frequency  2x / week    PT Duration  8 weeks    PT Treatment/Interventions  ADLs/Self Care Home Management;Aquatic Therapy;Cryotherapy;Electrical Stimulation;Iontophoresis '4mg'$ /ml Dexamethasone;Moist Heat;Ultrasound;Contrast Bath;DME Instruction;Gait training;Stair training;Functional mobility training;Therapeutic activities;Therapeutic exercise;Balance training;Neuromuscular re-education;Patient/family education;Manual techniques;Wheelchair mobility training;Energy conservation;Passive range of motion    PT Next Visit Plan  progress balance, strength, aquatic therapy    PT Home Exercise Plan  None provided at this time until further assessed, wife reports pt does not perform current HEP from [facility] PT     Consulted and Agree with Plan of Care  Patient       Patient will benefit from skilled therapeutic intervention in order to improve the following deficits and impairments:  Abnormal gait, Decreased balance, Decreased coordination, Decreased mobility, Impaired tone, Postural dysfunction  Visit Diagnosis: Other lack of coordination  Muscle weakness (generalized)  Other abnormalities of gait and mobility  Repeated falls     Problem List Patient Active Problem List   Diagnosis Date Noted  . 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 Radicular pain in right arm 10/09/2016  . Rash 09/03/2016  . GERD (gastroesophageal reflux disease) 07/31/2016  . Trochanteric bursitis 03/03/2016  . Left hand pain 10/30/2015  . Fracture, finger, multiple sites 10/30/2015  . Colon cancer screening 07/23/2015  . Healthcare maintenance 07/23/2015  . Gout 02/18/2015  . Depression 01/06/2015  . Irritation of eyelid 08/20/2014  . Dupuytren's contracture 08/20/2014  . Cough 07/21/2014  . Lumbar stenosis with neurogenic claudication 06/13/2014  . Preop cardiovascular exam 05/28/2014  . S/P deep brain stimulator placement 05/08/2014  . Joint pain 01/22/2014  . Medicare annual wellness visit, initial 01/19/2014  . Advance care planning 01/19/2014  . Parkinson's disease ([place]) 12/13/2013  . PTSD (post-traumatic stress disorder) 06/13/2013  . Erectile dysfunction 06/13/2013  . HLD (hyperlipidemia) 06/13/2013  . Hip pain 06/10/2013  . Pain in joint, shoulder region 06/10/2013  . Right leg swelling 06/03/2013  . Essential hypertension 06/03/2013  . Bradycardia by electrocardiogram 06/03/2013  . Obstructive sleep apnea 03/12/2013  . Spinal stenosis, lumbar region, with neurogenic claudication 12/14/2012   Janna Arch, PT, DPT   09/27/2017, 9:32 AM  [place] MAIN [facility] SERVICES [address] [place], [place], [address] Phone: [phone]   Fax:   [phone]  Name: [name] MRN: [id number] Date of Birth: 08-13-1950

## 2017-09-28 ENCOUNTER — Ambulatory Visit (INDEPENDENT_AMBULATORY_CARE_PROVIDER_SITE_OTHER): Payer: Medicare PPO

## 2017-09-28 VITALS — BP 110/70 | HR 46 | Temp 98.4°F | Ht 68.0 in | Wt 184.2 lb

## 2017-09-28 DIAGNOSIS — E782 Mixed hyperlipidemia: Secondary | ICD-10-CM

## 2017-09-28 DIAGNOSIS — Z0000 Encounter for general adult medical examination without abnormal findings: Secondary | ICD-10-CM

## 2017-09-28 DIAGNOSIS — M10079 Idiopathic gout, unspecified ankle and foot: Secondary | ICD-10-CM

## 2017-09-28 LAB — HEPATIC FUNCTION PANEL
ALBUMIN: 4.3 g/dL (ref 3.5–5.2)
ALT: 3 U/L (ref 0–53)
AST: 11 U/L (ref 0–37)
Alkaline Phosphatase: 87 U/L (ref 39–117)
Bilirubin, Direct: 0.1 mg/dL (ref 0.0–0.3)
TOTAL PROTEIN: 7.2 g/dL (ref 6.0–8.3)
Total Bilirubin: 0.6 mg/dL (ref 0.2–1.2)

## 2017-09-28 LAB — LIPID PANEL
CHOLESTEROL: 200 mg/dL (ref 0–200)
HDL: 37.2 mg/dL — ABNORMAL LOW (ref >39.00)
LDL Cholesterol: 130 mg/dL — ABNORMAL HIGH (ref 0–99)
NONHDL: 162.31
TRIGLYCERIDES: 162 mg/dL — AB (ref 0.0–149.0)
Total CHOL/HDL Ratio: 5
VLDL: 32.4 mg/dL (ref 0.0–40.0)

## 2017-09-28 LAB — URIC ACID: URIC ACID, SERUM: 6.3 mg/dL (ref 4.0–7.8)

## 2017-09-28 NOTE — Progress Notes (Signed)
Subjective:   [name] is a 67 y.o. male who presents for Medicare Annual/Subsequent preventive examination.  Review of Systems:  N/A Cardiac Risk Factors include: advanced age (>57men, >80 women);male gender;hypertension;dyslipidemia     Objective:    Vitals: BP 110/70 (BP Location: Right Arm, Patient Position: Sitting, Cuff Size: Normal)   Pulse (!) 46   Temp 98.4 F (36.9 C) (Oral)   Ht 5\' 8"  (1.727 m) Comment: no shoes  Wt 184 lb 4 oz (83.6 kg)   SpO2 95%   BMI 28.02 kg/m   Body mass index is 28.02 kg/m.  Advanced Directives 09/28/2017 09/26/2017 09/19/2017 07/14/2017 06/20/2017 06/11/2017 06/08/2017  Does Patient Have a Medical Advance Directive? Yes Yes Yes Yes Yes Yes Yes  Type of Paramedic of [place];Living will [place];Living will [place];Living will [place];Living will [place];Living will [place];Living will [place];Living will  Does patient want to make changes to medical advance directive? - - - No - Patient declined - - -  Copy of [place] in Chart? No - copy requested No - copy requested No - copy requested No - copy requested No - copy requested - No - copy requested  Would patient like information on creating a medical advance directive? - - - - - - -    Tobacco Social History   Tobacco Use  Smoking Status Never Smoker  Smokeless Tobacco Former Engineer, structural given: No   Clinical Intake:  Pre-visit preparation completed: Yes  Pain : No/denies pain Pain Score: 0-No pain     Nutritional Risks: None  How often do you need to have someone help you when you read instructions, pamphlets, or other written materials from your doctor or pharmacy?: 1 - Never What is the last grade level you completed in school?: 12th grade + 2 yrs college  Interpreter Needed?: No  Comments: pt  lives with spouse Information entered by :: LPinson, LPN  Past Medical History:  Diagnosis Date  . Arthritis   . Bradycardia   . Cancer [facility]) 2013   skin cancer  . Depression    ptsd  . Dysrhythmia    chronic slow heart rate  . GERD (gastroesophageal reflux disease)   . Headache(784.0)    tension headaches non recent  . History of chicken pox   . History of kidney stones    passed  . Hypertension    treated with HCTZ  . Parkinson's disease ([place])    dx'ed 15 years ago  . PTSD (post-traumatic stress disorder)   . Shortness of breath dyspnea   . Sleep apnea    doesn't use C-pap  . Varicose veins    Past Surgical History:  Procedure Laterality Date  . CHOLECYSTECTOMY N/A 10/22/2014   Procedure: LAPAROSCOPIC CHOLECYSTECTOMY WITH INTRAOPERATIVE CHOLANGIOGRAM;  Surgeon: Dia Crawford III, MD;  Location: [facility] ORS;  Service: General;  Laterality: N/A;  . cyst removed      from lip as a child  . LUMBAR LAMINECTOMY/DECOMPRESSION MICRODISCECTOMY Bilateral 12/14/2012   Procedure: Bilateral lumbar three-four, four-five decompressive laminotomy/foraminotomy;  Surgeon: Charlie Pitter, MD;  Location: [place] NEURO ORS;  Service: Neurosurgery;  Laterality: Bilateral;  . PULSE GENERATOR IMPLANT Bilateral 12/13/2013   Procedure: Bilateral implantable pulse generator placement;  Surgeon: Erline Levine, MD;  Location: [place] NEURO ORS;  Service: Neurosurgery;  Laterality: Bilateral;  Bilateral implantable pulse  generator placement  . skin cancer removed     from ears,   12 lft arm  rt leg 14  . SUBTHALAMIC STIMULATOR BATTERY REPLACEMENT Bilateral 07/14/2017   Procedure: BILATERAL IMPLANTED PULSE GENERATOR CHANGE FOR DEEP BRAIN STIMULATOR;  Surgeon: Erline Levine, MD;  Location: [place];  Service: Neurosurgery;  Laterality: Bilateral;  . SUBTHALAMIC STIMULATOR INSERTION Bilateral 12/06/2013   Procedure: SUBTHALAMIC STIMULATOR INSERTION;  Surgeon: Erline Levine, MD;  Location: [place] NEURO ORS;  Service: Neurosurgery;   Laterality: Bilateral;  Bilateral deep brain stimulator placement   Family History  Problem Relation Age of Onset  . Lung cancer Father   . Colon cancer Neg Hx   . Prostate cancer Neg Hx    Social History   Socioeconomic History  . Marital status: Married    Spouse name: Not on file  . Number of children: Not on file  . Years of education: Not on file  . Highest education level: Not on file  Occupational History  . Occupation: disabled    Comment: 2001, [employer]    Comment: search and rescue helicopter  Social Needs  . Financial resource strain: Not on file  . Food insecurity:    Worry: Not on file    Inability: Not on file  . Transportation needs:    Medical: Not on file    Non-medical: Not on file  Tobacco Use  . Smoking status: Never Smoker  . Smokeless tobacco: Former Network engineer and Sexual Activity  . Alcohol use: Yes    Alcohol/week: 0.0 oz    Comment: occasional (twice per month)  . Drug use: No  . Sexual activity: Never  Lifestyle  . Physical activity:    Days per week: Not on file    Minutes per session: Not on file  . Stress: Not on file  Relationships  . Social connections:    Talks on phone: Not on file    Gets together: Not on file    Attends religious service: Not on file    Active member of club or organization: Not on file    Attends meetings of clubs or organizations: Not on file    Relationship status: Not on file  Other Topics Concern  . Not on file  Social History Narrative   Previous Ellis Savage, CW-04   H/o PTSD.  Prev search and rescue work   3 kids from prev relationship.    Married 2014    Outpatient Encounter Medications as of 09/28/2017  Medication Sig  . buPROPion (WELLBUTRIN XL) 150 MG 24 hr tablet TAKE 3 TABLETS DAILY  . carbidopa-levodopa (SINEMET IR) 25-100 MG tablet TAKE 2 TABLETS IN THE MORNING , 1 TABLET IN THE AFTERNOON AND 1 TABLET IN THE EVENING  . cholecalciferol (VITAMIN D) 1000 UNITS tablet Take 1,000 Units by mouth daily.    Marland Kitchen escitalopram (LEXAPRO) 10 MG tablet TAKE 1 TABLET DAILY  . memantine (NAMENDA) 10 MG tablet TAKE 1 TABLET TWICE A DAY (Patient taking differently: Take 10 mg by mouth twice daily)  . naproxen sodium (ALEVE) 220 MG tablet Take 440 mg by mouth 2 (two) times daily as needed (for pain or headache).  Marland Kitchen omeprazole (PRILOSEC) 20 MG capsule Take 20 mg by mouth daily (Patient taking differently: Take 20 mg by mouth daily. )  . pravastatin (PRAVACHOL) 20 MG tablet TAKE 1 TABLET EVERY EVENING (Patient taking differently: TAKE 20 MG BY MOUTH EVERY EVENING)  . protriptyline (VIVACTIL) 5 MG tablet 1 at bedtime (Patient  not taking: Reported on 09/28/2017)  . temazepam (RESTORIL) 15 MG capsule 1 at bedtime for sleep (Patient not taking: Reported on 09/28/2017)   No facility-administered encounter medications on file as of 09/28/2017.     Activities of Daily Living In your present state of health, do you have any difficulty performing the following activities: 09/28/2017 07/14/2017  Hearing? N N  Vision? N N  Difficulty concentrating or making decisions? N Y  Walking or climbing stairs? Y Y  Dressing or bathing? Y Y  Doing errands, shopping? Y -  Conservation officer, nature and eating ? Y -  Using the Toilet? Y -  In the past six months, have you accidently leaked urine? N -  Do you have problems with loss of bowel control? Y -  Managing your Medications? Y -  Managing your Finances? Y -  Housekeeping or managing your Housekeeping? Y -  Some recent data might be hidden    Patient Care Team: Tonia Ghent, MD as PCP - General (Family Medicine) Estrella Myrtle, Nicolasa Ducking, LCSW as Social Worker (Licensed Clinical Social Worker) Laverle Hobby, MD as Consulting Physician (Pulmonary Disease) Tat, Eustace Quail, DO as Consulting Physician (Neurology) Minna Merritts, MD as Consulting Physician (Cardiology) Earnie Larsson, MD as Consulting Physician (Neurosurgery) Jannet Mantis, MD as Consulting Physician  (Dermatology)   Assessment:   This is a routine wellness examination for Render.   Hearing Screening   125Hz  250Hz  500Hz  1000Hz  2000Hz  3000Hz  4000Hz  6000Hz  8000Hz   Right ear:   40 40 40  40    Left ear:   40 40 40  40    Vision Screening Comments: Vision exam in 2018  Exercise Activities and Dietary recommendations Current Exercise Habits: Structured exercise class(Physical therapy 45 minutes 2x/wk), Time (Minutes): 45, Frequency (Times/Week): 2, Weekly Exercise (Minutes/Week): 90, Intensity: Mild, Exercise limited by: neurologic condition(s)  Goals    . DIET - INCREASE WATER INTAKE     Starting 09/28/2017, I will continue to drink at least 6-8 glasses of water daily.        Fall Risk Fall Risk  09/28/2017 07/03/2017 04/24/2017 12/02/2016 08/16/2016  Falls in the past year? Yes Yes Yes Yes Yes  Comment - - - - -  Number falls in past yr: 2 or more 2 or more 2 or more 2 or more 2 or more  Injury with Fall? No - No Yes No  Risk Factor Category  High Fall Risk High Fall Risk High Fall Risk High Fall Risk High Fall Risk  Risk for fall due to : Impaired balance/gait;History of fall(s);Impaired mobility - - - -  Risk for fall due to: Comment - - - - -  Follow up - Falls evaluation completed Falls evaluation completed Falls evaluation completed Falls evaluation completed   Depression Screen PHQ 2/9 Scores 09/28/2017 07/25/2016 07/21/2015 07/21/2015  PHQ - 2 Score 0 5 3 3   PHQ- 9 Score 14 15 10 10     Cognitive Function MMSE - Mini Mental State Exam 09/28/2017 07/03/2017 12/02/2016 07/25/2016 07/21/2015  Not completed: - Refused Unable to complete - -  Orientation to time 5 - - 5 5  Orientation to Place 5 - - 5 5  Registration 3 - - 3 3  Attention/ Calculation 0 - - 0 0  Recall 3 - - 3 3  Language- name 2 objects 0 - - 0 0  Language- repeat 1 - - 1 1  Language- follow 3 step command 3 - - 3  3  Language- read & follow direction 0 - - 0 0  Write a sentence 0 - - 0 0  Copy design 0 - - 0 0  Total  score 20 - - 20 20     PLEASE NOTE: A Mini-Cog screen was completed. Maximum score is 20. A value of 0 denotes this part of [place] MMSE was not completed or the patient failed this part of the Mini-Cog screening.   Mini-Cog Screening Orientation to Time - Max 5 pts Orientation to Place - Max 5 pts Registration - Max 3 pts Recall - Max 3 pts Language Repeat - Max 1 pts Language Follow 3 Step Command - Max 3 pts     Immunization History  Administered Date(s) Administered  . Influenza,inj,Quad PF,6+ Mos 01/17/2014, 01/05/2015, 03/23/2016, 02/23/2017  . Pneumococcal Conjugate-13 01/05/2015  . Pneumococcal Polysaccharide-23 05/03/2017    Screening Tests Health Maintenance  Topic Date Due  . COLON CANCER SCREENING ANNUAL FOBT  04/17/2018 (Originally 09/02/2017)  . TETANUS/TDAP  07/26/2026 (Originally 06/04/1969)  . INFLUENZA VACCINE  11/16/2017  . Hepatitis C Screening  Completed  . PNA vac Low Risk Adult  Completed  . COLONOSCOPY  Discontinued       Plan:     I have personally reviewed, addressed, and noted the following in the patient's chart:  A. Medical and social history B. Use of alcohol, tobacco or illicit drugs  C. Current medications and supplements D. Functional ability and status E.  Nutritional status F.  Physical activity G. Advance directives H. List of other physicians I.  Hospitalizations, surgeries, and ER visits in previous 12 months J.  Kenn[place] to include hearing, vision, cognitive, depression L. Referrals and appointments - none  In addition, I have reviewed and discussed with patient certain preventive protocols, quality metrics, and best practice recommendations. A written personalized care plan for preventive services as well as general preventive health recommendations were provided to patient.  See attached scanned questionnaire for additional information.   Signed,   Lindell Noe, MHA, BS, LPN Health Coach

## 2017-09-28 NOTE — Patient Instructions (Signed)
[name] , Thank you for taking time to come for your Medicare Wellness Visit. I appreciate your ongoing commitment to your health goals. Please review the following plan we discussed and let me know if I can assist you in the future.   These are the goals we discussed: Goals    . DIET - INCREASE WATER INTAKE     Starting 09/28/2017, I will continue to drink at least 6-8 glasses of water daily.        This is a list of the screening recommended for you and due dates:  Health Maintenance  Topic Date Due  . Stool Blood Test  04/17/2018*  . Tetanus Vaccine  07/26/2026*  . Flu Shot  11/16/2017  .  Hepatitis C: One time screening is recommended by Center for Disease Control  (CDC) for  adults born from [birth year] through 1966.   Completed  . Pneumonia vaccines  Completed  . Colon Cancer Screening  Discontinued  *Topic was postponed. The date shown is not the original due date.   Preventive Care for Adults  A healthy lifestyle and preventive care can promote health and wellness. Preventive health guidelines for adults include the following key practices.  . A routine yearly physical is a good way to check with your health care provider about your health and preventive screening. It is a chance to share any concerns and updates on your health and to receive a thorough exam.  . Visit your dentist for a routine exam and preventive care every 6 months. Brush your teeth twice a day and floss once a day. Good oral hygiene prevents tooth decay and gum disease.  . The frequency of eye exams is based on your age, health, family medical history, use  of contact lenses, and other factors. Follow your health care provider's recommendations for frequency of eye exams.  . Eat a healthy diet. Foods like vegetables, fruits, whole grains, low-fat dairy products, and lean protein foods contain the nutrients you need without too many calories. Decrease your intake of foods high in solid fats, added sugars, and  salt. Eat the right amount of calories for you. Get information about a proper diet from your health care provider, if necessary.  . Regular physical exercise is one of the most important things you can do for your health. Most adults should get at least 150 minutes of moderate-intensity exercise (any activity that increases your heart rate and causes you to sweat) each week. In addition, most adults need muscle-strengthening exercises on 2 or more days a week.  Silver Sneakers may be a benefit available to you. To determine eligibility, you may visit the website: www.silversneakers.com or contact program at (701)174-8305 Mon-Fri between 8AM-8PM.   . Maintain a healthy weight. The body mass index (BMI) is a screening tool to identify possible weight problems. It provides an estimate of body fat based on height and weight. Your health care provider can find your BMI and can help you achieve or maintain a healthy weight.   For adults 20 years and older: ? A BMI below 18.5 is considered underweight. ? A BMI of 18.5 to 24.9 is normal. ? A BMI of 25 to 29.9 is considered overweight. ? A BMI of 30 and above is considered obese.   . Maintain normal blood lipids and cholesterol levels by exercising and minimizing your intake of saturated fat. Eat a balanced diet with plenty of fruit and vegetables. Blood tests for lipids and cholesterol should begin at  age 71 and be repeated every 5 years. If your lipid or cholesterol levels are high, you are over 50, or you are at high risk for heart disease, you may need your cholesterol levels checked more frequently. Ongoing high lipid and cholesterol levels should be treated with medicines if diet and exercise are not working.  . If you smoke, find out from your health care provider how to quit. If you do not use tobacco, please do not start.  . If you choose to drink alcohol, please do not consume more than 2 drinks per day. One drink is considered to be 12 ounces  (355 mL) of beer, 5 ounces (148 mL) of wine, or 1.5 ounces (44 mL) of liquor.  . If you are 38-58 years old, ask your health care provider if you should take aspirin to prevent strokes.  . Use sunscreen. Apply sunscreen liberally and repeatedly throughout the day. You should seek shade when your shadow is shorter than you. Protect yourself by wearing long sleeves, pants, a wide-brimmed hat, and sunglasses year round, whenever you are outdoors.  . Once a month, do a whole body skin exam, using a mirror to look at the skin on your back. Tell your health care provider of new moles, moles that have irregular borders, moles that are larger than a pencil eraser, or moles that have changed in shape or color.

## 2017-09-28 NOTE — Progress Notes (Signed)
PCP notes:   Health maintenance:  Colon cancer screening - FOBT kit provided to patient  Abnormal screenings:   Fall risk - hx of multiple falls Fall Risk  09/28/2017 07/03/2017 04/24/2017 12/02/2016 08/16/2016  Falls in the past year? _0   Comment - - - - -  Number falls in past yr: 2 or more 2 or more 2 or more 2 or more 2 or more  Injury with Fall? No - No Yes No  Risk Factor Category  _1   Risk for fall due to : Impaired balance/gait;History of fall(s);Impaired mobility - - - -  Risk for fall due to: Comment - - - - -  Follow up - Falls evaluation completed Falls evaluation completed Falls evaluation completed Falls evaluation completed   Depression score: 14 Depression screen [facility] 2/9 09/28/2017 07/25/2016  Decreased Interest 0 3  Down, Depressed, Hopeless 0 2  PHQ - 2 Score 0 5  Altered sleeping 3 3  Tired, decreased energy 3 0  Change in appetite 3 3  Feeling bad or failure about yourself  2 1  Trouble concentrating 3 3  Moving slowly or fidgety/restless 0 0  Suicidal thoughts 0 0  PHQ-9 Score 14 15  Difficult doing work/chores Somewhat difficult Somewhat difficult  Some recent data might be hidden   Patient concerns:   None  Nurse concerns:  None  Next PCP appt:   10/03/17 @ 1430  I reviewed health advisor's note, was available for consultation on the day of service listed in this note, and agree with documentation and plan. Elsie Stain, MD.

## 2017-10-02 ENCOUNTER — Encounter: Payer: Self-pay | Admitting: Occupational Therapy

## 2017-10-02 ENCOUNTER — Ambulatory Visit: Payer: Medicare PPO | Admitting: Occupational Therapy

## 2017-10-02 DIAGNOSIS — M6281 Muscle weakness (generalized): Secondary | ICD-10-CM

## 2017-10-02 DIAGNOSIS — G20 Parkinson's disease: Secondary | ICD-10-CM | POA: Diagnosis not present

## 2017-10-02 DIAGNOSIS — R296 Repeated falls: Secondary | ICD-10-CM | POA: Diagnosis not present

## 2017-10-02 DIAGNOSIS — M25559 Pain in unspecified hip: Secondary | ICD-10-CM | POA: Diagnosis not present

## 2017-10-02 DIAGNOSIS — R278 Other lack of coordination: Secondary | ICD-10-CM

## 2017-10-02 DIAGNOSIS — M25551 Pain in right hip: Secondary | ICD-10-CM | POA: Diagnosis not present

## 2017-10-02 DIAGNOSIS — R293 Abnormal posture: Secondary | ICD-10-CM | POA: Diagnosis not present

## 2017-10-02 DIAGNOSIS — R279 Unspecified lack of coordination: Secondary | ICD-10-CM | POA: Diagnosis not present

## 2017-10-02 DIAGNOSIS — R2681 Unsteadiness on feet: Secondary | ICD-10-CM | POA: Diagnosis not present

## 2017-10-02 DIAGNOSIS — G4733 Obstructive sleep apnea (adult) (pediatric): Secondary | ICD-10-CM | POA: Diagnosis not present

## 2017-10-02 DIAGNOSIS — R2689 Other abnormalities of gait and mobility: Secondary | ICD-10-CM | POA: Diagnosis not present

## 2017-10-02 NOTE — Therapy (Addendum)
[place] MAIN [facility] SERVICES [address] [place], [place], [address] Phone: [phone]   Fax:  [phone]  Occupational Therapy Treatment  Patient Details  Name: [name] MRN: [id number] Date of Birth: 01-14-1951 Referring Provider: Wells Guiles Tat   Encounter Date: 10/02/2017  OT End of Session - 10/02/17 0917    Visit Number  12    Number of Visits  24    Date for OT Re-Evaluation  10/23/17    Authorization Type  Visit 2 of 10 for progress reporting period starting 07/31/2017    OT Start Time  0844 to 930   Activity Tolerance  Patient tolerated treatment well    Behavior During Therapy  [facility] for tasks assessed/performed       Past Medical History:  Diagnosis Date  . Arthritis   . Bradycardia   . Cancer [facility]) 2013   skin cancer  . Depression    ptsd  . Dysrhythmia    chronic slow heart rate  . GERD (gastroesophageal reflux disease)   . Headache(784.0)    tension headaches non recent  . History of chicken pox   . History of kidney stones    passed  . Hypertension    treated with HCTZ  . Parkinson's disease ([place])    dx'ed 15 years ago  . PTSD (post-traumatic stress disorder)   . Shortness of breath dyspnea   . Sleep apnea    doesn't use C-pap  . Varicose veins     Past Surgical History:  Procedure Laterality Date  . CHOLECYSTECTOMY N/A 10/22/2014   Procedure: LAPAROSCOPIC CHOLECYSTECTOMY WITH INTRAOPERATIVE CHOLANGIOGRAM;  Surgeon: Dia Crawford III, MD;  Location: [facility] ORS;  Service: General;  Laterality: N/A;  . cyst removed      from lip as a child  . LUMBAR LAMINECTOMY/DECOMPRESSION MICRODISCECTOMY Bilateral 12/14/2012   Procedure: Bilateral lumbar three-four, four-five decompressive laminotomy/foraminotomy;  Surgeon: Charlie Pitter, MD;  Location: [place] NEURO ORS;  Service: Neurosurgery;  Laterality: Bilateral;  . PULSE GENERATOR IMPLANT Bilateral 12/13/2013   Procedure: Bilateral implantable pulse generator placement;   Surgeon: Erline Levine, MD;  Location: [place] NEURO ORS;  Service: Neurosurgery;  Laterality: Bilateral;  Bilateral implantable pulse generator placement  . skin cancer removed     from ears,   12 lft arm  rt leg 15  . SUBTHALAMIC STIMULATOR BATTERY REPLACEMENT Bilateral 07/14/2017   Procedure: BILATERAL IMPLANTED PULSE GENERATOR CHANGE FOR DEEP BRAIN STIMULATOR;  Surgeon: Erline Levine, MD;  Location: [place];  Service: Neurosurgery;  Laterality: Bilateral;  . SUBTHALAMIC STIMULATOR INSERTION Bilateral 12/06/2013   Procedure: SUBTHALAMIC STIMULATOR INSERTION;  Surgeon: Erline Levine, MD;  Location: [place] NEURO ORS;  Service: Neurosurgery;  Laterality: Bilateral;  Bilateral deep brain stimulator placement    There were no vitals filed for this visit.  Subjective Assessment - 10/02/17 0849    Subjective   Patient reports he had an OK weekend, didn't do much.  Would like to work on [employer], [employer] today.  States, "no pain yet"    Pertinent History  Pt. is a 67 y.o. mlae who has a history of Parkinson's Disease. Pt. has a Deep Brain Stimulator in place. Pt. has a history of Low Back Pain with 2 back surgeries in August 2014, and February 2016. Pt. has a history of multiple frequent falls occurring at a rate of 6x's a week.     Patient Stated Goals  To return home.    Currently in Pain?  No/denies  Pain Score  0-No pain                   OT Treatments/Exercises (OP) - 10/02/17 0998      ADLs   Writing  Patient seen for handwriting skills with right and and use of red built up pen.  Patient unable to recall address and required cues from therapist.  Cues for printing, letter size and formation.      Neurological Re-education Exercises   Other Exercises 1  Patient seen for right hand grip strengthening for sustained grip, 25 repetitions for one set with 17.9#, 23.4# for one set.  Cues for holding hand gripper correctly and holding to pick up and move object from one place to another.     Other  Exercises 2  Patient seen for finger strengthening and coordination with attempts to pick up and snap small beads together with focus on prehension skills and bilateral hand use. Moderate difficulty to complete and has to use the table to support the string of bed in order to push the next one together. Completed string of 20.               OT Education - 10/02/17 [phone]    Education provided  Yes    Education Details  built up pen    Person(s) Educated  Patient    Methods  Demonstration;Verbal cues;Explanation    Comprehension  Verbal cues required;Verbalized understanding          OT Long Term Goals - 09/14/17 0954      OT LONG TERM GOAL #1   Title  Pt. will improve Bilateral UE strength by 1 mm grade to assist with ADLs, and IADLs    Baseline  EVal: BUE strength: 4+/5 overall    Time  12    Period  Weeks    Status  On-going    Target Date  10/23/17      OT LONG TERM GOAL #2   Title  Pt. will improve grip strength to be able to pour himself a beverage.    Baseline  Eval: Pt. is unable to hold and pour a beevrage.    Time  12    Period  Weeks    Status  On-going    Target Date  10/23/17      OT LONG TERM GOAL #3   Title  Pt. will improve lateral pinch stength to be able to independently turn a key.     Baseline  Eval: Pt. has difficulty holding, and turning  key.    Time  12    Period  Weeks    Status  On-going    Target Date  10/23/17      OT LONG TERM GOAL #4   Title  Pt. will improve Right hand [facility] skills to be able to independently grasp and use small objects needed for fixing car parts.    Baseline  Eval: Right hand 1 min. & 5 sec. on the 9 hole peg test.    Time  12    Period  Weeks    Status  On-going    Target Date  10/23/17      OT LONG TERM GOAL #5   Title  Pt. will be demonstarte self-feeding with modified independence with minimal spillage.    Baseline  Eval: Pt. requires assistance, and has alot of spillage.    Time  12    Period  Weeks  Status  On-going    Target Date  10/23/17      Long Term Additional Goals   Additional Long Term Goals  Yes      OT LONG TERM GOAL #6   Title  Pt. will manipulate buttons, and cuff buttons independently.    Baseline  Eval: Pt. has difficulty     Time  12    Period  Weeks    Status  On-going    Target Date  10/23/17      OT LONG TERM GOAL #7   Title  Pt. will write 1 sentence efficiently with 90% legibility.    Baseline  Eval: name only 50% legibility    Time  12    Period  Weeks    Status  On-going    Target Date  10/23/17      OT LONG TERM GOAL #8   Title  Pt. will improve typing speed, and accuracy to be able to to type emails efficently    Baseline  09/14/2017: Typing speed: 3 wpm, Accuracy: 41% after 3 attempts with moderate cues for hand placement    Time  12    Period  Weeks    Status  New    Target Date  12/07/17            Plan - 10/02/17 0919    Clinical Impression Statement  Patient demonstrates difficulty with manipulation of small snap beads to string together, working on finger strength and coordination.  Patient continues to demonstrate decreased balance with transfers from transport chair to chair at the table and requires CGA to minimal assist.  Patient focused on handwriting skills this date with use of 2 pens, one smaller grip and other with larger grip surface.  Patient's quality of handwriting improved with larger pen grip, tends to form lettters really small with smaller pen grip and benefits from built up surface area for stability. Continue to work towards goals in [place] to increase independence in daily tasks. when writing his kids names, therapist could read 2 of 5 names.    Occupational performance deficits (Please refer to evaluation for details):  ADL's;IADL's    Rehab Potential  Good    Current Impairments/barriers affecting progress:  Positive Indicators: age, familiy support, motivation. Negative Indicators: Multiple comorbidities, and history of  falls.    OT Frequency  2x / week    OT Duration  12 weeks    OT Treatment/Interventions  Self-care/ADL training;Therapeutic exercise;DME and/or AE instruction;Energy conservation;Neuromuscular education;Patient/family education;Therapeutic activities    Consulted and Agree with Plan of Care  Patient       Patient will benefit from skilled therapeutic intervention in order to improve the following deficits and impairments:  Pain, Decreased strength, Decreased coordination, Decreased balance, Impaired UE functional use, Decreased safety awareness, Decreased activity tolerance, Decreased cognition, Impaired flexibility, Impaired vision/preception  Visit Diagnosis: Other lack of coordination  Muscle weakness (generalized)    Problem List Patient Active Problem List   Diagnosis Date Noted  . 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 Radicular pain in right arm 10/09/2016  . Rash 09/03/2016  . GERD (gastroesophageal reflux disease) 07/31/2016  . Trochanteric bursitis 03/03/2016  . Left hand pain 10/30/2015  . Fracture, finger, multiple sites 10/30/2015  . Colon cancer screening 07/23/2015  . Healthcare maintenance 07/23/2015  . Gout 02/18/2015  . Depression 01/06/2015  . Irritation of eyelid 08/20/2014  . Dupuytren's contracture 08/20/2014  . Cough 07/21/2014  . Lumbar stenosis with neurogenic claudication  06/13/2014  . Preop cardiovascular exam 05/28/2014  . S/P deep brain stimulator placement 05/08/2014  . Joint pain 01/22/2014  . Medicare annual wellness visit, initial 01/19/2014  . Advance care planning 01/19/2014  . Parkinson's disease ([place]) 12/13/2013  . PTSD (post-traumatic stress disorder) 06/13/2013  . Erectile dysfunction 06/13/2013  . HLD (hyperlipidemia) 06/13/2013  . Hip pain 06/10/2013  . Pain in joint, shoulder region 06/10/2013  . Right leg swelling 06/03/2013  . Essential hypertension 06/03/2013  . Bradycardia by electrocardiogram 06/03/2013  . Obstructive  sleep apnea 03/12/2013  . Spinal stenosis, lumbar region, with neurogenic claudication 12/14/2012   Achilles Dunk, OTR/L, CLT  Lovett,[name] 10/02/2017, 9:30 AM  [place] MAIN [facility] SERVICES [place], [place], [address] Phone: [phone]   Fax:  [phone]  Name: [name] MRN: [id number] Date of Birth: 1950-10-17

## 2017-10-03 ENCOUNTER — Other Ambulatory Visit: Payer: Self-pay

## 2017-10-03 ENCOUNTER — Ambulatory Visit (INDEPENDENT_AMBULATORY_CARE_PROVIDER_SITE_OTHER): Payer: Medicare PPO | Admitting: Family Medicine

## 2017-10-03 ENCOUNTER — Encounter: Payer: Self-pay | Admitting: Family Medicine

## 2017-10-03 ENCOUNTER — Ambulatory Visit (INDEPENDENT_AMBULATORY_CARE_PROVIDER_SITE_OTHER): Payer: Medicare PPO | Admitting: Psychology

## 2017-10-03 ENCOUNTER — Ambulatory Visit: Payer: Medicare PPO

## 2017-10-03 VITALS — BP 150/80 | HR 49 | Temp 98.6°F | Ht 68.0 in | Wt 185.0 lb

## 2017-10-03 DIAGNOSIS — R278 Other lack of coordination: Secondary | ICD-10-CM | POA: Diagnosis not present

## 2017-10-03 DIAGNOSIS — R296 Repeated falls: Secondary | ICD-10-CM | POA: Diagnosis not present

## 2017-10-03 DIAGNOSIS — R279 Unspecified lack of coordination: Secondary | ICD-10-CM | POA: Diagnosis not present

## 2017-10-03 DIAGNOSIS — F331 Major depressive disorder, recurrent, moderate: Secondary | ICD-10-CM

## 2017-10-03 DIAGNOSIS — G20 Parkinson's disease: Secondary | ICD-10-CM

## 2017-10-03 DIAGNOSIS — M545 Low back pain, unspecified: Secondary | ICD-10-CM

## 2017-10-03 DIAGNOSIS — R293 Abnormal posture: Secondary | ICD-10-CM

## 2017-10-03 DIAGNOSIS — F4323 Adjustment disorder with mixed anxiety and depressed mood: Secondary | ICD-10-CM | POA: Diagnosis not present

## 2017-10-03 DIAGNOSIS — M6281 Muscle weakness (generalized): Secondary | ICD-10-CM | POA: Diagnosis not present

## 2017-10-03 DIAGNOSIS — G8929 Other chronic pain: Secondary | ICD-10-CM

## 2017-10-03 DIAGNOSIS — Z0000 Encounter for general adult medical examination without abnormal findings: Secondary | ICD-10-CM

## 2017-10-03 DIAGNOSIS — M25559 Pain in unspecified hip: Secondary | ICD-10-CM | POA: Diagnosis not present

## 2017-10-03 DIAGNOSIS — R29898 Other symptoms and signs involving the musculoskeletal system: Secondary | ICD-10-CM

## 2017-10-03 DIAGNOSIS — E782 Mixed hyperlipidemia: Secondary | ICD-10-CM

## 2017-10-03 DIAGNOSIS — R2681 Unsteadiness on feet: Secondary | ICD-10-CM | POA: Diagnosis not present

## 2017-10-03 DIAGNOSIS — M25551 Pain in right hip: Secondary | ICD-10-CM | POA: Diagnosis not present

## 2017-10-03 DIAGNOSIS — R262 Difficulty in walking, not elsewhere classified: Secondary | ICD-10-CM

## 2017-10-03 DIAGNOSIS — G20A1 Parkinson's disease without dyskinesia, without mention of fluctuations: Secondary | ICD-10-CM

## 2017-10-03 DIAGNOSIS — R2689 Other abnormalities of gait and mobility: Secondary | ICD-10-CM | POA: Diagnosis not present

## 2017-10-03 DIAGNOSIS — M48062 Spinal stenosis, lumbar region with neurogenic claudication: Secondary | ICD-10-CM

## 2017-10-03 NOTE — Progress Notes (Signed)
Parkinsons, per neuro.  We talked about recurrent falls and prevention.  He is using a belt.  He needs help getting up.  Family was asking about a vest to help with balance- d/w them- I don't know if this would be useful.  He had recent swallowing eval done.  Report d/w pt.  He has a [facility] hearing pending.  He is still seeing [place] with psychology and that has been useful.    Over the last few years, he has had more falls, less mobility, speech changes, swallowing changes, cognitive changes and loss of independence.  He needs help dressing.  He can't prepare meals.  Some days he can't remember how to use a phone. 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    ==================================== Swallowing testing- Flash laryngeal penetration of the barium during multiple swallows of thin liquid. This elicited a cough reflex which cleared the penetration. This is similar to that seen on the previous study.  There is a moderate amount of retained barium in the valleculae with each swallow that is usually cleared with a second dry swallow. ====================================  IFOB pending, d/w pt.   Prostate cancer screening and PSA options (with potential risks and benefits of testing vs not testing) were discussed along with recent recs/guidelines.  He declined testing PSA at this point.  Elevated Cholesterol: Using medications without problems: yes Muscle aches: not from statin.   Diet compliance: encouraged.   Exercise: limited.   Labs d/w pt.    PMH and SH reviewed  Meds, vitals, and allergies reviewed.   ROS: Per HPI unless specifically indicated in ROS section   GEN: nad, alert and oriented, speech at baseline.  In wheechair HEENT: mucous membranes moist NECK: supple w/o LA CV: rrr. PULM: ctab, no inc wob ABD: soft, +bs EXT: no edema SKIN: no acute rash Faint tremor at baseline.

## 2017-10-03 NOTE — Patient Instructions (Signed)
Don't change your meds for now.  Update me as needed.  Take care.  Glad to see you.   

## 2017-10-03 NOTE — Therapy (Signed)
[place] MAIN [facility] SERVICES [address] [place], [place], [address] Phone: [phone]   Fax:  [phone]  Physical Therapy Treatment  Patient Details  Name: [name] MRN: [id number] Date of Birth: [birth year] Referring Provider: Wells Guiles Tat   Encounter Date: 10/03/2017  PT End of Session - 10/03/17 1514    Visit Number  40    Number of Visits  49    Date for PT Re-Evaluation  11/09/17    PT Start Time  0940    PT Stop Time  1030    PT Time Calculation (min)  50 min    Activity Tolerance  Patient tolerated treatment well    Behavior During Therapy  [facility] for tasks assessed/performed       Past Medical History:  Diagnosis Date  . Arthritis   . Bradycardia   . Cancer [facility]) 2013   skin cancer  . Depression    ptsd  . Dysrhythmia    chronic slow heart rate  . GERD (gastroesophageal reflux disease)   . Headache(784.0)    tension headaches non recent  . History of chicken pox   . History of kidney stones    passed  . Hypertension    treated with HCTZ  . Parkinson's disease ([place])    dx'ed 15 years ago  . PTSD (post-traumatic stress disorder)   . Shortness of breath dyspnea   . Sleep apnea    doesn't use C-pap  . Varicose veins     Past Surgical History:  Procedure Laterality Date  . CHOLECYSTECTOMY N/A 10/22/2014   Procedure: LAPAROSCOPIC CHOLECYSTECTOMY WITH INTRAOPERATIVE CHOLANGIOGRAM;  Surgeon: Dia Crawford III, MD;  Location: [facility] ORS;  Service: General;  Laterality: N/A;  . cyst removed      from lip as a child  . LUMBAR LAMINECTOMY/DECOMPRESSION MICRODISCECTOMY Bilateral 12/14/2012   Procedure: Bilateral lumbar three-four, four-five decompressive laminotomy/foraminotomy;  Surgeon: Charlie Pitter, MD;  Location: [place] NEURO ORS;  Service: Neurosurgery;  Laterality: Bilateral;  . PULSE GENERATOR IMPLANT Bilateral 12/13/2013   Procedure: Bilateral implantable pulse generator placement;  Surgeon: Erline Levine, MD;  Location:  [place] NEURO ORS;  Service: Neurosurgery;  Laterality: Bilateral;  Bilateral implantable pulse generator placement  . skin cancer removed     from ears,   12 lft arm  rt leg 15  . SUBTHALAMIC STIMULATOR BATTERY REPLACEMENT Bilateral 07/14/2017   Procedure: BILATERAL IMPLANTED PULSE GENERATOR CHANGE FOR DEEP BRAIN STIMULATOR;  Surgeon: Erline Levine, MD;  Location: [place];  Service: Neurosurgery;  Laterality: Bilateral;  . SUBTHALAMIC STIMULATOR INSERTION Bilateral 12/06/2013   Procedure: SUBTHALAMIC STIMULATOR INSERTION;  Surgeon: Erline Levine, MD;  Location: [place] NEURO ORS;  Service: Neurosurgery;  Laterality: Bilateral;  Bilateral deep brain stimulator placement    There were no vitals filed for this visit.  Subjective Assessment - 10/03/17 1512    Subjective  Pt has no complaint/concerns from previous session. Denies any actual falls this past week. Spouse notes pt "is in his own world today".     Patient is accompained by:  Family member    Pertinent History  Patient referred for physical therapy due to wife's request for patient to get aquatic therapy to help with his conditioning. Pt is also complaining of chronic low back and R hip pain and frequent falls. 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 The last time he was in the water he was very  uncomfortable because he didn't feel like he had any control. Pt has a history of 2 low back surgeries in August 2014 and February 2016. Pt and wife are unable to recall exactly what was performed but they know that he had stenosis. Surgeries were performed by Dr. Annette Stable in [place]. Pt reports that his low back and R hip pain are constant. He describes the pain in the back as "dull" and the pain in the hip as "sharp." For both back and hip worst pain is 10/10, Best: 2/10, Present: 2/10. No particular episode of trauma to his low back or hip preceding the pain. Pt has a history of Parkinson's with a deep brain  stimulator. He ambulates with a 4 wheeled walker called a U-step (automatic brakes, audio cues for stepping). He states that he falls frequently and reports that he has had approximately 40-50 falls in the last 6 months. No specific recent changes in his health.       Ambulation, blue dumbbells  Fwd 4 L  Side 2 L  Sidestep with squat 2 L    Fall to recover fwd, 2L Min A  Resisted squat with blue dumbbells, 20x  Bench LE/core strengthening  2# ankle wts   Straight leg up and outs, R/L 2 x 10 each   Bike 4 min   Flutter 4 min STS from bench, no UE use, 30x                         PT Education - 10/03/17 1513    Education provided  Yes    Education Details  Fall to recover fwd, resisted squat, resisted LE work with 2# wts. Continued on core stabilization with exercise.     Person(s) Educated  Patient    Methods  Explanation;Demonstration;Tactile cues;Verbal cues    Comprehension  Verbalized understanding;Verbal cues required;Tactile cues required;Need further instruction       PT Short Term Goals - 07/19/17 1741      PT SHORT TERM GOAL #1   Title  Pt will perform HEP with family's supervision, for improved balance, transfers, and gait.      Baseline  patient reports compliance, however not every day    Time  4    Period  Weeks    Status  Partially Met      PT SHORT TERM GOAL #2   Title  Pt will perform 5x sit<>stand in less than or equal to 15 seconds for improved efficiency and safety with transfers.    Baseline  19 seconds; deffered at this time due to excessive LOB     Time  2    Period  Weeks    Status  On-going        PT Long Term Goals - 09/14/17 6967      PT LONG TERM GOAL #1   Title  Pt will decrease 5TSTS by at least 3 seconds in order to demonstrate clinically significant improvement in LE strength     Baseline  02/08/17: 20.1 seconds 12/18: 17 seconds ; 09/14/17 =24.34 sec with assist due to poor static standing balance    Time  8     Period  Weeks    Status  Achieved      PT LONG TERM GOAL #2   Title  Pt will decrease TUG by at least 3.4s in order to demonstrate decreased fall risk     Baseline  02/08/17: 22.1s 12/18: 20 seconds 2/7:  19 seconds; 4/3: 23 seconds with walker, 09/14/1913.12 sec    Time  8    Period  Weeks    Status  Partially Met    Target Date  11/09/17      PT LONG TERM GOAL #3   Title  Pt will improve BERG by at least 3 points in order to demonstrate clinically significant improvement in balance.    Baseline  02/08/17: 30/56 12/18: 38/56      Time  8    Period  Weeks    Status  Achieved    Target Date  --      PT LONG TERM GOAL #4   Title  Pt/wife will verbalize understanding of plans for continued community fitness upon D/C from PT including aquatic therapy program    Baseline  02/08/17: pt currently reports being more comfortable in the water    Time  8    Period  Weeks    Status  Partially Met    Target Date  11/09/17      PT LONG TERM GOAL #5   Title  Pt will decrease mODI scoreby at least 13 points in order demonstrate clinically significant reduction in pain/disability     Baseline  02/08/17: 54% 12/18: 46% 2/7: 42% 4/3; 50%, 09/14/17 42%    Time  8    Period  Weeks    Status  Partially Met    Target Date  11/09/17      PT LONG TERM GOAL #6   Title  Pt will improve BERG to 44/56  in order to demonstrate clinically significant improvement in balance for decreased fall risk    Baseline  12/18: 38/56 2/7: 41/56 4/3: 41/56, 09/14/17  19/56    Time  8    Period  Weeks    Status  Partially Met    Target Date  11/09/17            Plan - 10/03/17 1515    Clinical Impression Statement  Pt requires Min guard to Min A throughout most of session for balance both during exercises and static stand between exercises if not holding a railing. Pt continues to demonstrate difficulty with slowing movement pattern once initiated in a direction and sequencing steps to an exercise (ie. side step  with squat broken into step out, squat, stand tall, step together) even when cued with R/L leg. Demonstrates posterior LOB throughout session both in stand and sit and even in sit with 2# ankle weights on. Continue to progress core/LE strength and balance to improve safety with functional activity and reduce falls.     Rehab Potential  Fair    PT Frequency  2x / week    PT Duration  8 weeks    PT Treatment/Interventions  ADLs/Self Care Home Management;Aquatic Therapy;Cryotherapy;Electrical Stimulation;Iontophoresis '4mg'$ /ml Dexamethasone;Moist Heat;Ultrasound;Contrast Bath;DME Instruction;Gait training;Stair training;Functional mobility training;Therapeutic activities;Therapeutic exercise;Balance training;Neuromuscular re-education;Patient/family education;Manual techniques;Wheelchair mobility training;Energy conservation;Passive range of motion    PT Next Visit Plan  progress balance, strength, aquatic therapy    PT Home Exercise Plan  None provided at this time until further assessed, wife reports pt does not perform current HEP from [facility] PT    Consulted and Agree with Plan of Care  Patient       Patient will benefit from skilled therapeutic intervention in order to improve the following deficits and impairments:  Abnormal gait, Decreased balance, Decreased coordination, Decreased mobility, Impaired tone, Postural dysfunction  Visit Diagnosis: Other lack of coordination  Muscle weakness (generalized)  Other abnormalities of gait and mobility  Repeated falls  Unsteadiness on feet  Unspecified lack of coordination  Abnormal posture  Chronic midline low back pain without sciatica  Other symptoms and signs involving the musculoskeletal system  Spinal stenosis, lumbar region, with neurogenic claudication  Parkinson's disease ([place])  Difficulty walking     Problem List Patient Active Problem List   Diagnosis Date Noted  . 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 Radicular pain in  right arm 10/09/2016  . Rash 09/03/2016  . GERD (gastroesophageal reflux disease) 07/31/2016  . Trochanteric bursitis 03/03/2016  . Left hand pain 10/30/2015  . Fracture, finger, multiple sites 10/30/2015  . Colon cancer screening 07/23/2015  . Healthcare maintenance 07/23/2015  . Gout 02/18/2015  . Depression 01/06/2015  . Irritation of eyelid 08/20/2014  . Dupuytren's contracture 08/20/2014  . Cough 07/21/2014  . Lumbar stenosis with neurogenic claudication 06/13/2014  . Preop cardiovascular exam 05/28/2014  . S/P deep brain stimulator placement 05/08/2014  . Joint pain 01/22/2014  . Medicare annual wellness visit, initial 01/19/2014  . Advance care planning 01/19/2014  . Parkinson's disease ([place]) 12/13/2013  . PTSD (post-traumatic stress disorder) 06/13/2013  . Erectile dysfunction 06/13/2013  . HLD (hyperlipidemia) 06/13/2013  . Hip pain 06/10/2013  . Pain in joint, shoulder region 06/10/2013  . Right leg swelling 06/03/2013  . Essential hypertension 06/03/2013  . Bradycardia by electrocardiogram 06/03/2013  . Obstructive sleep apnea 03/12/2013  . Spinal stenosis, lumbar region, with neurogenic claudication 12/14/2012    [name] 10/03/2017, 3:34 PM  [place] MAIN [facility] SERVICES [place], [place], [address] Phone: [phone]   Fax:  [phone]  Name: [name] MRN: [id number] Date of Birth: 08-Dec-1950

## 2017-10-04 NOTE — Assessment & Plan Note (Signed)
Labs discussed with patient.  Exercise is limited.  Diet discussed with patient.  Continue statin.  No adverse effect on medication.

## 2017-10-04 NOTE — Assessment & Plan Note (Signed)
IFOB pending, d/w pt.   Prostate cancer screening and PSA options (with potential risks and benefits of testing vs not testing) were discussed along with recent recs/guidelines.  He declined testing PSA at this point.

## 2017-10-04 NOTE — Assessment & Plan Note (Signed)
No change in medications.  I need neurology input.  It is unclear to me how many concussions he had as part of his routine activity with the [employer].  He will continue with counseling.  I do not know if the vest would be useful and I will defer to neurology about this.  I will await his VA hearing results. >25 minutes spent in face to face time with patient, >50% spent in counselling or coordination of care, discussing Parkinson's, his history, labs, etc.

## 2017-10-05 ENCOUNTER — Ambulatory Visit: Payer: Medicare PPO | Admitting: Occupational Therapy

## 2017-10-05 ENCOUNTER — Encounter: Payer: Self-pay | Admitting: Occupational Therapy

## 2017-10-05 ENCOUNTER — Ambulatory Visit: Payer: Medicare PPO

## 2017-10-05 DIAGNOSIS — R2681 Unsteadiness on feet: Secondary | ICD-10-CM | POA: Diagnosis not present

## 2017-10-05 DIAGNOSIS — R296 Repeated falls: Secondary | ICD-10-CM

## 2017-10-05 DIAGNOSIS — R279 Unspecified lack of coordination: Secondary | ICD-10-CM | POA: Diagnosis not present

## 2017-10-05 DIAGNOSIS — M6281 Muscle weakness (generalized): Secondary | ICD-10-CM | POA: Diagnosis not present

## 2017-10-05 DIAGNOSIS — R2689 Other abnormalities of gait and mobility: Secondary | ICD-10-CM | POA: Diagnosis not present

## 2017-10-05 DIAGNOSIS — M25551 Pain in right hip: Secondary | ICD-10-CM | POA: Diagnosis not present

## 2017-10-05 DIAGNOSIS — R293 Abnormal posture: Secondary | ICD-10-CM | POA: Diagnosis not present

## 2017-10-05 DIAGNOSIS — M25559 Pain in unspecified hip: Secondary | ICD-10-CM | POA: Diagnosis not present

## 2017-10-05 DIAGNOSIS — R278 Other lack of coordination: Secondary | ICD-10-CM

## 2017-10-05 NOTE — Therapy (Deleted)
[place] MAIN [facility] SERVICES [address] [place], [place], [address] Phone: [phone]   Fax:  [phone]  October 05, 2017    @CCLISTADDRESS @  Occupational Therapy Discharge Summary   Patient: [name] MRN: [id number] Date of Birth: [birth year]  Diagnosis: Muscle weakness (generalized)  Other lack of coordination  Referring Provider: Wells Guiles Tat   The above patient had been seen in Occupational Therapy *** times of *** treatments scheduled with *** no shows and *** cancellations.  The treatment consisted of *** The patient is: {improved/worse/unchanged:3041574}  Subjective: ***  Discharge Findings: ***  Functional Status at Discharge: ***  {QASUO:1561537}  Plan - 10/05/17 0851    Clinical Impression Statement  Pt. reports having had a fall last week resulting in low back pain. Pt. reports that he tends to fall alot. Pt. continues to work on improving writing legibility, and typing speed, and accuracy.      Occupational performance deficits (Please refer to evaluation for details):  ADL's;IADL's    Rehab Potential  Good    Current Impairments/barriers affecting progress:  Positive Indicators: age, familiy support, motivation. Negative Indicators: Multiple comorbidities, and history of falls.    OT Frequency  2x / week    OT Duration  12 weeks    OT Treatment/Interventions  Self-care/ADL training;Therapeutic exercise;DME and/or AE instruction;Energy conservation;Neuromuscular education;Patient/family education;Therapeutic activities    Clinical Decision Making  Several treatment options, min-mod task modification necessary    OT Home Exercise Plan  HEP for using rubber bands for strengthening at home and work on more handwriting.       Sincerely,  Harrel Carina, OT  CC @[email or web address] @  [place] [address] [place], [place], [address] Phone:  [phone]   Fax:  [phone]  Patient: [name] MRN: [id number] Date of Birth: 1951-02-24

## 2017-10-05 NOTE — Therapy (Signed)
[place] MAIN [facility] SERVICES [address] [place], [place], [address] Phone: [phone]   Fax:  [phone]  Physical Therapy Treatment  Patient Details  Name: [name] MRN: [id number] Date of Birth: 10-26-1950 Referring Provider: Wells Guiles Tat   Encounter Date: 10/05/2017  PT End of Session - 10/05/17 0805    Visit Number  41    Number of Visits  49    Date for PT Re-Evaluation  11/09/17    PT Start Time  0801    PT Stop Time  0845    PT Time Calculation (min)  44 min    Equipment Utilized During Treatment  Gait belt    Activity Tolerance  Patient tolerated treatment well    Behavior During Therapy  [facility] for tasks assessed/performed       Past Medical History:  Diagnosis Date  . Arthritis   . Bradycardia   . Cancer [facility]) 2013   skin cancer  . Depression    ptsd  . Dysrhythmia    chronic slow heart rate  . GERD (gastroesophageal reflux disease)   . Headache(784.0)    tension headaches non recent  . History of chicken pox   . History of kidney stones    passed  . Hypertension    treated with HCTZ  . Parkinson's disease ([place])    dx'ed 15 years ago  . PTSD (post-traumatic stress disorder)   . Shortness of breath dyspnea   . Sleep apnea    doesn't use C-pap  . Varicose veins     Past Surgical History:  Procedure Laterality Date  . CHOLECYSTECTOMY N/A 10/22/2014   Procedure: LAPAROSCOPIC CHOLECYSTECTOMY WITH INTRAOPERATIVE CHOLANGIOGRAM;  Surgeon: Dia Crawford III, MD;  Location: [facility] ORS;  Service: General;  Laterality: N/A;  . cyst removed      from lip as a child  . LUMBAR LAMINECTOMY/DECOMPRESSION MICRODISCECTOMY Bilateral 12/14/2012   Procedure: Bilateral lumbar three-four, four-five decompressive laminotomy/foraminotomy;  Surgeon: Charlie Pitter, MD;  Location: [place] NEURO ORS;  Service: Neurosurgery;  Laterality: Bilateral;  . PULSE GENERATOR IMPLANT Bilateral 12/13/2013   Procedure: Bilateral implantable pulse generator  placement;  Surgeon: Erline Levine, MD;  Location: [place] NEURO ORS;  Service: Neurosurgery;  Laterality: Bilateral;  Bilateral implantable pulse generator placement  . skin cancer removed     from ears,   12 lft arm  rt leg 15  . SUBTHALAMIC STIMULATOR BATTERY REPLACEMENT Bilateral 07/14/2017   Procedure: BILATERAL IMPLANTED PULSE GENERATOR CHANGE FOR DEEP BRAIN STIMULATOR;  Surgeon: Erline Levine, MD;  Location: [place];  Service: Neurosurgery;  Laterality: Bilateral;  . SUBTHALAMIC STIMULATOR INSERTION Bilateral 12/06/2013   Procedure: SUBTHALAMIC STIMULATOR INSERTION;  Surgeon: Erline Levine, MD;  Location: [place] NEURO ORS;  Service: Neurosurgery;  Laterality: Bilateral;  Bilateral deep brain stimulator placement    There were no vitals filed for this visit.  Subjective Assessment - 10/05/17 0803    Subjective  Patient reports multiple falls yesterday. Some pain in left hip from the falls (where he landed), reports one of the falls was in the garage.     Patient is accompained by:  Family member    Pertinent History  Patient referred for physical therapy due to wife's request for patient to get aquatic therapy to help with his conditioning. Pt is also complaining of chronic low back and R hip pain and frequent falls. 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 The last time he was in the water he was very uncomfortable because he didn't feel like he had any control. Pt has a history of 2 low back surgeries in August 2014 and February 2016. Pt and wife are unable to recall exactly what was performed but they know that he had stenosis. Surgeries were performed by Dr. Annette Stable in [place]. Pt reports that his low back and R hip pain are constant. He describes the pain in the back as "dull" and the pain in the hip as "sharp." For both back and hip worst pain is 10/10, Best: 2/10, Present: 2/10. No particular episode of trauma to his low back or hip preceding the  pain. Pt has a history of Parkinson's with a deep brain stimulator. He ambulates with a 4 wheeled walker called a U-step (automatic brakes, audio cues for stepping). He states that he falls frequently and reports that he has had approximately 40-50 falls in the last 6 months. No specific recent changes in his health.     Currently in Pain?  Yes    Pain Score  4     Pain Location  Hip    Pain Orientation  Left    Pain Descriptors / Indicators  Aching    Pain Type  Acute pain    Pain Onset  Yesterday    Pain Frequency  Constant       -Nustep L4, 3 minutes for warm-up (unbilled);    -Standing marching 5# ankle weight x 15 bilateral, BUE support;   -Standing abduction 5# ankle weight x 15 bilateral, BUE support;   -Standing hamstring curl 5# ankle weight x 15 bilateral, BUE support;   -Standing hip extension 5# ankle weight x 15 bilateral, BUE support;   -Standing hip flexion with straight leg 5# ankle weight x 15 bilateral, BUE support   -seated LAQ 10x each leg 3 second holds    -seated adduction squeezes 20x  -seated abduction GTB one leg at a time 10x each leg   -seated marches 20x GTB around knees ; cues for upright posture    -seated heel toe raises 20x   Neuromuscular Re-education   6" step step-ups alternating LE x 10 each, verbal cues for L and R for pace.    -Airex pad balance with feet apart eyes closed 5 x 30s,    -speed ladder: one foot in each box to improve smoothness/coordination of ambulation and step length. SUE support 6x length of bars  -speed ladder, BUE support side step two feet one box. ; improved coordination going to left: 6x      Pt. response to medical necessity: Patient will continue to benefit from skilled physical therapy to improve static and dynamic standing balance and decrease fall risk for improved functional capacity of activities                         PT Education - 10/05/17 0804    Education provided  Yes     Education Details  exercise technique, stability     Person(s) Educated  Patient    Methods  Explanation;Demonstration;Verbal cues    Comprehension  Verbalized understanding;Returned demonstration       PT Short Term Goals - 07/19/17 1741      PT SHORT TERM GOAL #1   Title  Pt will perform HEP with family's supervision, for improved balance, transfers, and gait.      Baseline  patient reports compliance, however  not every day    Time  4    Period  Weeks    Status  Partially Met      PT SHORT TERM GOAL #2   Title  Pt will perform 5x sit<>stand in less than or equal to 15 seconds for improved efficiency and safety with transfers.    Baseline  19 seconds; deffered at this time due to excessive LOB     Time  2    Period  Weeks    Status  On-going        PT Long Term Goals - 09/14/17 1443      PT LONG TERM GOAL #1   Title  Pt will decrease 5TSTS by at least 3 seconds in order to demonstrate clinically significant improvement in LE strength     Baseline  02/08/17: 20.1 seconds 12/18: 17 seconds ; 09/14/17 =24.34 sec with assist due to poor static standing balance    Time  8    Period  Weeks    Status  Achieved      PT LONG TERM GOAL #2   Title  Pt will decrease TUG by at least 3.4s in order to demonstrate decreased fall risk     Baseline  02/08/17: 22.1s 12/18: 20 seconds 2/7: 19 seconds; 4/3: 23 seconds with walker, 09/14/1913.12 sec    Time  8    Period  Weeks    Status  Partially Met    Target Date  11/09/17      PT LONG TERM GOAL #3   Title  Pt will improve BERG by at least 3 points in order to demonstrate clinically significant improvement in balance.    Baseline  02/08/17: 30/56 12/18: 38/56      Time  8    Period  Weeks    Status  Achieved    Target Date  --      PT LONG TERM GOAL #4   Title  Pt/wife will verbalize understanding of plans for continued community fitness upon D/C from PT including aquatic therapy program    Baseline  02/08/17: pt currently reports  being more comfortable in the water    Time  8    Period  Weeks    Status  Partially Met    Target Date  11/09/17      PT LONG TERM GOAL #5   Title  Pt will decrease mODI scoreby at least 13 points in order demonstrate clinically significant reduction in pain/disability     Baseline  02/08/17: 54% 12/18: 46% 2/7: 42% 4/3; 50%, 09/14/17 42%    Time  8    Period  Weeks    Status  Partially Met    Target Date  11/09/17      PT LONG TERM GOAL #6   Title  Pt will improve BERG to 44/56  in order to demonstrate clinically significant improvement in balance for decreased fall risk    Baseline  12/18: 38/56 2/7: 41/56 4/3: 41/56, 09/14/17  19/56    Time  8    Period  Weeks    Status  Partially Met    Target Date  11/09/17            Plan - 10/05/17 1540    Clinical Impression Statement  Patient requires tactile cueing of abdomen for muscle contraction to combat posterior trunk lean to restore COM preventing posterior LOB.  Patient demonstrates improved coordination when side stepping to left rather than right with  improved step length and accuracy. Patient will continue to benefit from skilled physical therapy to improve static and dynamic standing balance and decrease fall risk for improved functional capacity of activities    Rehab Potential  Fair    PT Frequency  2x / week    PT Duration  8 weeks    PT Treatment/Interventions  ADLs/Self Care Home Management;Aquatic Therapy;Cryotherapy;Electrical Stimulation;Iontophoresis '4mg'$ /ml Dexamethasone;Moist Heat;Ultrasound;Contrast Bath;DME Instruction;Gait training;Stair training;Functional mobility training;Therapeutic activities;Therapeutic exercise;Balance training;Neuromuscular re-education;Patient/family education;Manual techniques;Wheelchair mobility training;Energy conservation;Passive range of motion    PT Next Visit Plan  progress balance, strength, aquatic therapy    PT Home Exercise Plan  None provided at this time until further  assessed, wife reports pt does not perform current HEP from [facility] PT    Consulted and Agree with Plan of Care  Patient       Patient will benefit from skilled therapeutic intervention in order to improve the following deficits and impairments:  Abnormal gait, Decreased balance, Decreased coordination, Decreased mobility, Impaired tone, Postural dysfunction  Visit Diagnosis: Other lack of coordination  Muscle weakness (generalized)  Other abnormalities of gait and mobility  Repeated falls     Problem List Patient Active Problem List   Diagnosis Date Noted  . 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 Radicular pain in right arm 10/09/2016  . Rash 09/03/2016  . GERD (gastroesophageal reflux disease) 07/31/2016  . Trochanteric bursitis 03/03/2016  . Left hand pain 10/30/2015  . Fracture, finger, multiple sites 10/30/2015  . Colon cancer screening 07/23/2015  . Healthcare maintenance 07/23/2015  . Gout 02/18/2015  . Depression 01/06/2015  . Irritation of eyelid 08/20/2014  . Dupuytren's contracture 08/20/2014  . Cough 07/21/2014  . Lumbar stenosis with neurogenic claudication 06/13/2014  . Preop cardiovascular exam 05/28/2014  . S/P deep brain stimulator placement 05/08/2014  . Joint pain 01/22/2014  . Medicare annual wellness visit, initial 01/19/2014  . Advance care planning 01/19/2014  . Parkinson's disease ([place]) 12/13/2013  . PTSD (post-traumatic stress disorder) 06/13/2013  . Erectile dysfunction 06/13/2013  . HLD (hyperlipidemia) 06/13/2013  . Hip pain 06/10/2013  . Pain in joint, shoulder region 06/10/2013  . Right leg swelling 06/03/2013  . Essential hypertension 06/03/2013  . Bradycardia by electrocardiogram 06/03/2013  . Obstructive sleep apnea 03/12/2013  . Spinal stenosis, lumbar region, with neurogenic claudication 12/14/2012   Janna Arch, PT, DPT   10/05/2017, 8:44 AM  [place] MAIN [facility] SERVICES [place], [place], [address] Phone: [phone]   Fax:  [phone]  Name: [name] MRN: [id number] Date of Birth: January 14, 1951

## 2017-10-05 NOTE — Therapy (Signed)
[place] MAIN [facility] SERVICES [address] [place], [place], [address] Phone: [phone]   Fax:  [phone]  Occupational Therapy Treatment  Patient Details  Name: [name] MRN: [id number] Date of Birth: Jun 05, 1950 Referring Provider: Wells Guiles Tat   Encounter Date: 10/05/2017  OT End of Session - 10/05/17 0851    Visit Number  13    Number of Visits  24    Date for OT Re-Evaluation  10/23/17    Authorization Type  Visit 3 of 10 for progress reporting period starting 07/31/2017    OT Start Time  0845    OT Stop Time  0930    OT Time Calculation (min)  45 min    Activity Tolerance  Patient tolerated treatment well    Behavior During Therapy  [facility] for tasks assessed/performed       Past Medical History:  Diagnosis Date  . Arthritis   . Bradycardia   . Cancer [facility]) 2013   skin cancer  . Depression    ptsd  . Dysrhythmia    chronic slow heart rate  . GERD (gastroesophageal reflux disease)   . Headache(784.0)    tension headaches non recent  . History of chicken pox   . History of kidney stones    passed  . Hypertension    treated with HCTZ  . Parkinson's disease ([place])    dx'ed 15 years ago  . PTSD (post-traumatic stress disorder)   . Shortness of breath dyspnea   . Sleep apnea    doesn't use C-pap  . Varicose veins     Past Surgical History:  Procedure Laterality Date  . CHOLECYSTECTOMY N/A 10/22/2014   Procedure: LAPAROSCOPIC CHOLECYSTECTOMY WITH INTRAOPERATIVE CHOLANGIOGRAM;  Surgeon: Dia Crawford III, MD;  Location: [facility] ORS;  Service: General;  Laterality: N/A;  . cyst removed      from lip as a child  . LUMBAR LAMINECTOMY/DECOMPRESSION MICRODISCECTOMY Bilateral 12/14/2012   Procedure: Bilateral lumbar three-four, four-five decompressive laminotomy/foraminotomy;  Surgeon: Charlie Pitter, MD;  Location: [place] NEURO ORS;  Service: Neurosurgery;  Laterality: Bilateral;  . PULSE GENERATOR IMPLANT Bilateral 12/13/2013    Procedure: Bilateral implantable pulse generator placement;  Surgeon: Erline Levine, MD;  Location: [place] NEURO ORS;  Service: Neurosurgery;  Laterality: Bilateral;  Bilateral implantable pulse generator placement  . skin cancer removed     from ears,   12 lft arm  rt leg 15  . SUBTHALAMIC STIMULATOR BATTERY REPLACEMENT Bilateral 07/14/2017   Procedure: BILATERAL IMPLANTED PULSE GENERATOR CHANGE FOR DEEP BRAIN STIMULATOR;  Surgeon: Erline Levine, MD;  Location: [place];  Service: Neurosurgery;  Laterality: Bilateral;  . SUBTHALAMIC STIMULATOR INSERTION Bilateral 12/06/2013   Procedure: SUBTHALAMIC STIMULATOR INSERTION;  Surgeon: Erline Levine, MD;  Location: [place] NEURO ORS;  Service: Neurosurgery;  Laterality: Bilateral;  Bilateral deep brain stimulator placement    There were no vitals filed for this visit.  Subjective Assessment - 10/05/17 0849    Subjective   Pt. reports he had a good Father's Day.    Patient is accompained by:  Family member    Pertinent History  Pt. is a 67 y.o. mlae who has a history of Parkinson's Disease. Pt. has a Deep Brain Stimulator in place. Pt. has a history of Low Back Pain with 2 back surgeries in August 2014, and February 2016. Pt. has a history of multiple frequent falls occurring at a rate of 6x's a week.     Patient Stated Goals  To  return home.    Currently in Pain?  Yes    Pain Score  4     Pain Location  Back    Pain Descriptors / Indicators  Aching      OT TREATMENT   Selfcare:  Pt. worked on Media planner. Pt. worked with built-up handled pens. Pt. Attempted to work with several variations of adaptive pens. Pt. had the most control with the light weight 1" thick foam handle attachment. Pt. Worked on prewriting, and writing exercises. Pt. had difficulty formulating, and connecting various sized circles.Pt. continues to present with limited writing legibility with the writing, with decreasing legibility, and size of written letters as the length of the  writing increases. Pt. worked on typing speed, and accuracy.Pt. Presented with multiple errors with typing, name.                         OT Education - 10/05/17 0850    Education provided  Yes    Education Details  built up pen    [employer]) Educated  Patient    Methods  Demonstration;Verbal cues;Explanation    Comprehension  Verbal cues required;Verbalized understanding          OT Long Term Goals - 09/14/17 0954      OT LONG TERM GOAL #1   Title  Pt. will improve Bilateral UE strength by 1 mm grade to assist with ADLs, and IADLs    Baseline  EVal: BUE strength: 4+/5 overall    Time  12    Period  Weeks    Status  On-going    Target Date  10/23/17      OT LONG TERM GOAL #2   Title  Pt. will improve grip strength to be able to pour himself a beverage.    Baseline  Eval: Pt. is unable to hold and pour a beevrage.    Time  12    Period  Weeks    Status  On-going    Target Date  10/23/17      OT LONG TERM GOAL #3   Title  Pt. will improve lateral pinch stength to be able to independently turn a key.     Baseline  Eval: Pt. has difficulty holding, and turning  key.    Time  12    Period  Weeks    Status  On-going    Target Date  10/23/17      OT LONG TERM GOAL #4   Title  Pt. will improve Right hand [facility] skills to be able to independently grasp and use small objects needed for fixing car parts.    Baseline  Eval: Right hand 1 min. & 5 sec. on the 9 hole peg test.    Time  12    Period  Weeks    Status  On-going    Target Date  10/23/17      OT LONG TERM GOAL #5   Title  Pt. will be demonstarte self-feeding with modified independence with minimal spillage.    Baseline  Eval: Pt. requires assistance, and has alot of spillage.    Time  12    Period  Weeks    Status  On-going    Target Date  10/23/17      Long Term Additional Goals   Additional Long Term Goals  Yes      OT LONG TERM GOAL #6   Title  Pt. will manipulate buttons, and  cuff  buttons independently.    Baseline  Eval: Pt. has difficulty     Time  12    Period  Weeks    Status  On-going    Target Date  10/23/17      OT LONG TERM GOAL #7   Title  Pt. will write 1 sentence efficiently with 90% legibility.    Baseline  Eval: name only 50% legibility    Time  12    Period  Weeks    Status  On-going    Target Date  10/23/17      OT LONG TERM GOAL #8   Title  Pt. will improve typing speed, and accuracy to be able to to type emails efficently    Baseline  09/14/2017: Typing speed: 3 wpm, Accuracy: 41% after 3 attempts with moderate cues for hand placement    Time  12    Period  Weeks    Status  New    Target Date  12/07/17            Plan - 10/05/17 0851    Clinical Impression Statement  Pt. reports having had a fall last week resulting in low back pain. Pt. reports that he tends to fall alot. Pt. continues to work on improving writing legibility, and typing speed, and accuracy.      Occupational performance deficits (Please refer to evaluation for details):  ADL's;IADL's    Rehab Potential  Good    Current Impairments/barriers affecting progress:  Positive Indicators: age, familiy support, motivation. Negative Indicators: Multiple comorbidities, and history of falls.    OT Frequency  2x / week    OT Duration  12 weeks    OT Treatment/Interventions  Self-care/ADL training;Therapeutic exercise;DME and/or AE instruction;Energy conservation;Neuromuscular education;Patient/family education;Therapeutic activities    Clinical Decision Making  Several treatment options, min-mod task modification necessary    OT Home Exercise Plan  HEP for using rubber bands for strengthening at home and work on more handwriting.       Patient will benefit from skilled therapeutic intervention in order to improve the following deficits and impairments:  Pain, Decreased strength, Decreased coordination, Decreased balance, Impaired UE functional use, Decreased safety awareness,  Decreased activity tolerance, Decreased cognition, Impaired flexibility, Impaired vision/preception  Visit Diagnosis: Muscle weakness (generalized)  Other lack of coordination    Problem List Patient Active Problem List   Diagnosis Date Noted  . 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 Radicular pain in right arm 10/09/2016  . Rash 09/03/2016  . GERD (gastroesophageal reflux disease) 07/31/2016  . Trochanteric bursitis 03/03/2016  . Left hand pain 10/30/2015  . Fracture, finger, multiple sites 10/30/2015  . Colon cancer screening 07/23/2015  . Healthcare maintenance 07/23/2015  . Gout 02/18/2015  . Depression 01/06/2015  . Irritation of eyelid 08/20/2014  . Dupuytren's contracture 08/20/2014  . Cough 07/21/2014  . Lumbar stenosis with neurogenic claudication 06/13/2014  . Preop cardiovascular exam 05/28/2014  . S/P deep brain stimulator placement 05/08/2014  . Joint pain 01/22/2014  . Medicare annual wellness visit, initial 01/19/2014  . Advance care planning 01/19/2014  . Parkinson's disease ([place]) 12/13/2013  . PTSD (post-traumatic stress disorder) 06/13/2013  . Erectile dysfunction 06/13/2013  . HLD (hyperlipidemia) 06/13/2013  . Hip pain 06/10/2013  . Pain in joint, shoulder region 06/10/2013  . Right leg swelling 06/03/2013  . Essential hypertension 06/03/2013  . Bradycardia by electrocardiogram 06/03/2013  . Obstructive sleep apnea 03/12/2013  . Spinal stenosis, lumbar region, with neurogenic claudication  12/14/2012    Harrel Carina, MS, OTR/L 10/05/2017, 8:59 AM  [place] MAIN [facility] SERVICES [address] [place], [place], [address] Phone: [phone]   Fax:  [phone]  Name: DAIRE OKIMOTO MRN: [id number] Date of Birth: 02/13/1951

## 2017-10-09 ENCOUNTER — Encounter: Payer: Self-pay | Admitting: Occupational Therapy

## 2017-10-09 ENCOUNTER — Ambulatory Visit: Payer: Medicare PPO | Admitting: Occupational Therapy

## 2017-10-09 ENCOUNTER — Telehealth: Payer: Self-pay | Admitting: Neurology

## 2017-10-09 DIAGNOSIS — R2689 Other abnormalities of gait and mobility: Secondary | ICD-10-CM | POA: Diagnosis not present

## 2017-10-09 DIAGNOSIS — R278 Other lack of coordination: Secondary | ICD-10-CM | POA: Diagnosis not present

## 2017-10-09 DIAGNOSIS — R2681 Unsteadiness on feet: Secondary | ICD-10-CM | POA: Diagnosis not present

## 2017-10-09 DIAGNOSIS — M6281 Muscle weakness (generalized): Secondary | ICD-10-CM | POA: Diagnosis not present

## 2017-10-09 DIAGNOSIS — M25559 Pain in unspecified hip: Secondary | ICD-10-CM | POA: Diagnosis not present

## 2017-10-09 DIAGNOSIS — R279 Unspecified lack of coordination: Secondary | ICD-10-CM | POA: Diagnosis not present

## 2017-10-09 DIAGNOSIS — R410 Disorientation, unspecified: Secondary | ICD-10-CM

## 2017-10-09 DIAGNOSIS — M25551 Pain in right hip: Secondary | ICD-10-CM | POA: Diagnosis not present

## 2017-10-09 DIAGNOSIS — R293 Abnormal posture: Secondary | ICD-10-CM | POA: Diagnosis not present

## 2017-10-09 DIAGNOSIS — R296 Repeated falls: Secondary | ICD-10-CM | POA: Diagnosis not present

## 2017-10-09 NOTE — Telephone Encounter (Signed)
Suspect that this is just advancing dementia but get cbc, chem, UA, ct brain without

## 2017-10-09 NOTE — Telephone Encounter (Signed)
Patient's wife called and wanted to let Dr. Carles Collet know that over the weekend [name] did not know who she was. She was not sure if Dr. Carles Collet needed see him for a follow up? Please Call. Thanks

## 2017-10-09 NOTE — Therapy (Addendum)
[place] MAIN [facility] SERVICES [address] [place], [place], [address] Phone: [phone]   Fax:  [phone]  Occupational Therapy Treatment  Patient Details  Name: [name] MRN: [id number] Date of Birth: 09/20/1950 Referring Provider: Wells Guiles Tat   Encounter Date: 10/09/2017  OT End of Session - 10/09/17 0858    Visit Number  14    Number of Visits  24    Date for OT Re-Evaluation  10/23/17    Authorization Type  Visit 4 of 10 for progress reporting period starting 07/31/2017    OT Start Time  0845    OT Stop Time  0930    OT Time Calculation (min)  45 min    Activity Tolerance  Patient tolerated treatment well    Behavior During Therapy  [facility] for tasks assessed/performed       Past Medical History:  Diagnosis Date  . Arthritis   . Bradycardia   . Cancer [facility]) 2013   skin cancer  . Depression    ptsd  . Dysrhythmia    chronic slow heart rate  . GERD (gastroesophageal reflux disease)   . Headache(784.0)    tension headaches non recent  . History of chicken pox   . History of kidney stones    passed  . Hypertension    treated with HCTZ  . Parkinson's disease ([place])    dx'ed 15 years ago  . PTSD (post-traumatic stress disorder)   . Shortness of breath dyspnea   . Sleep apnea    doesn't use C-pap  . Varicose veins     Past Surgical History:  Procedure Laterality Date  . CHOLECYSTECTOMY N/A 10/22/2014   Procedure: LAPAROSCOPIC CHOLECYSTECTOMY WITH INTRAOPERATIVE CHOLANGIOGRAM;  Surgeon: Dia Crawford III, MD;  Location: [facility] ORS;  Service: General;  Laterality: N/A;  . cyst removed      from lip as a child  . LUMBAR LAMINECTOMY/DECOMPRESSION MICRODISCECTOMY Bilateral 12/14/2012   Procedure: Bilateral lumbar three-four, four-five decompressive laminotomy/foraminotomy;  Surgeon: Charlie Pitter, MD;  Location: [place] NEURO ORS;  Service: Neurosurgery;  Laterality: Bilateral;  . PULSE GENERATOR IMPLANT Bilateral 12/13/2013    Procedure: Bilateral implantable pulse generator placement;  Surgeon: Erline Levine, MD;  Location: [place] NEURO ORS;  Service: Neurosurgery;  Laterality: Bilateral;  Bilateral implantable pulse generator placement  . skin cancer removed     from ears,   12 lft arm  rt leg 15  . SUBTHALAMIC STIMULATOR BATTERY REPLACEMENT Bilateral 07/14/2017   Procedure: BILATERAL IMPLANTED PULSE GENERATOR CHANGE FOR DEEP BRAIN STIMULATOR;  Surgeon: Erline Levine, MD;  Location: [place];  Service: Neurosurgery;  Laterality: Bilateral;  . SUBTHALAMIC STIMULATOR INSERTION Bilateral 12/06/2013   Procedure: SUBTHALAMIC STIMULATOR INSERTION;  Surgeon: Erline Levine, MD;  Location: [place] NEURO ORS;  Service: Neurosurgery;  Laterality: Bilateral;  Bilateral deep brain stimulator placement    There were no vitals filed for this visit.  Subjective Assessment - 10/09/17 0856    Subjective   Pt. reports he is doing well.    Patient is accompained by:  Family member    Pertinent History  Pt. is a 67 y.o. mlae who has a history of Parkinson's Disease. Pt. has a Deep Brain Stimulator in place. Pt. has a history of Low Back Pain with 2 back surgeries in August 2014, and February 2016. Pt. has a history of multiple frequent falls occurring at a rate of 6x's a week.     Limitations  Frequent falls, limited motor control,  and [place].     Currently in Pain?  No/denies      OT TREATMENT    Neuro muscular re-education:  Pt. performed Green tasks using the grooved pegboard. Pt. worked on grasping the grooved pegs from a horizontal position, and moving the pegs to a vertical position in the hand to prepare for placing them in the grooved slot. Pt. had difficulty with translatory movements of the hand moving objects through the hand.  Self Care:  Pt. worked on Media planner. Pt. worked with built-up handled pens. Pt. Attempted to work with several variations of adaptive pens. Pt. had the most control with the light weight 1" thick foam handle  attachment. Pt. Worked on prewriting, and writing exercises. Pt. had difficulty formulating horizontal eights, and connecting various sized circles. Pt. continues to present with limited writing legibility with the writing, with decreasing legibility, and size of written letters as the length of the writing increases. Pt. worked on writing speed. Pt. wrote a 2 limit paragraph in 4 min. With 50% legibility.                            OT Long Term Goals - 09/14/17 0954      OT LONG TERM GOAL #1   Title  Pt. will improve Bilateral UE strength by 1 mm grade to assist with ADLs, and IADLs    Baseline  EVal: BUE strength: 4+/5 overall    Time  12    Period  Weeks    Status  On-going    Target Date  10/23/17      OT LONG TERM GOAL #2   Title  Pt. will improve grip strength to be able to pour himself a beverage.    Baseline  Eval: Pt. is unable to hold and pour a beevrage.    Time  12    Period  Weeks    Status  On-going    Target Date  10/23/17      OT LONG TERM GOAL #3   Title  Pt. will improve lateral pinch stength to be able to independently turn a key.     Baseline  Eval: Pt. has difficulty holding, and turning  key.    Time  12    Period  Weeks    Status  On-going    Target Date  10/23/17      OT LONG TERM GOAL #4   Title  Pt. will improve Right hand [facility] skills to be able to independently grasp and use small objects needed for fixing car parts.    Baseline  Eval: Right hand 1 min. & 5 sec. on the 9 hole peg test.    Time  12    Period  Weeks    Status  On-going    Target Date  10/23/17      OT LONG TERM GOAL #5   Title  Pt. will be demonstarte self-feeding with modified independence with minimal spillage.    Baseline  Eval: Pt. requires assistance, and has alot of spillage.    Time  12    Period  Weeks    Status  On-going    Target Date  10/23/17      Long Term Additional Goals   Additional Long Term Goals  Yes      OT LONG TERM GOAL #6   Title   Pt. will manipulate buttons, and cuff buttons independently.  Baseline  Eval: Pt. has difficulty     Time  12    Period  Weeks    Status  On-going    Target Date  10/23/17      OT LONG TERM GOAL #7   Title  Pt. will write 1 sentence efficiently with 90% legibility.    Baseline  Eval: name only 50% legibility    Time  12    Period  Weeks    Status  On-going    Target Date  10/23/17      OT LONG TERM GOAL #8   Title  Pt. will improve typing speed, and accuracy to be able to to type emails efficently    Baseline  09/14/2017: Typing speed: 3 wpm, Accuracy: 41% after 3 attempts with moderate cues for hand placement    Time  12    Period  Weeks    Status  New    Target Date  12/07/17            Plan - 10/09/17 0859    Clinical Impression Statement  Pt. reports having had a fall this past weekend Pt. continues to work on improving BUE strength, and fine motor coordination skills for improved writing legibility, typing, ADL. and IADL functioning.   Occupational performance deficits (Please refer to evaluation for details):  ADL's;IADL's    Rehab Potential  Good    Current Impairments/barriers affecting progress:  Positive Indicators: age, familiy support, motivation. Negative Indicators: Multiple comorbidities, and history of falls.    OT Frequency  2x / week    OT Duration  12 weeks    OT Treatment/Interventions  Self-care/ADL training;Therapeutic exercise;DME and/or AE instruction;Energy conservation;Neuromuscular education;Patient/family education;Therapeutic activities    Clinical Decision Making  Several treatment options, min-mod task modification necessary    Consulted and Agree with Plan of Care  Patient       Patient will benefit from skilled therapeutic intervention in order to improve the following deficits and impairments:  Pain, Decreased strength, Decreased coordination, Decreased balance, Impaired UE functional use, Decreased safety awareness, Decreased activity  tolerance, Decreased cognition, Impaired flexibility, Impaired vision/preception  Visit Diagnosis: Muscle weakness (generalized)  Other lack of coordination    Problem List Patient Active Problem List   Diagnosis Date Noted  . 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 Radicular pain in right arm 10/09/2016  . Rash 09/03/2016  . GERD (gastroesophageal reflux disease) 07/31/2016  . Trochanteric bursitis 03/03/2016  . Left hand pain 10/30/2015  . Fracture, finger, multiple sites 10/30/2015  . Colon cancer screening 07/23/2015  . Healthcare maintenance 07/23/2015  . Gout 02/18/2015  . Depression 01/06/2015  . Irritation of eyelid 08/20/2014  . Dupuytren's contracture 08/20/2014  . Cough 07/21/2014  . Lumbar stenosis with neurogenic claudication 06/13/2014  . Preop cardiovascular exam 05/28/2014  . S/P deep brain stimulator placement 05/08/2014  . Joint pain 01/22/2014  . Medicare annual wellness visit, initial 01/19/2014  . Advance care planning 01/19/2014  . Parkinson's disease ([place]) 12/13/2013  . PTSD (post-traumatic stress disorder) 06/13/2013  . Erectile dysfunction 06/13/2013  . HLD (hyperlipidemia) 06/13/2013  . Hip pain 06/10/2013  . Pain in joint, shoulder region 06/10/2013  . Right leg swelling 06/03/2013  . Essential hypertension 06/03/2013  . Bradycardia by electrocardiogram 06/03/2013  . Obstructive sleep apnea 03/12/2013  . Spinal stenosis, lumbar region, with neurogenic claudication 12/14/2012    Harrel Carina 10/09/2017, 9:19 AM  [facility]  [place], [place], [address] Phone: [phone]   Fax:  [phone]  Name: [name] MRN: [id number] Date of Birth: 1950/08/06

## 2017-10-09 NOTE — Telephone Encounter (Signed)
Called and spoke with wife, [name]. Advised her of labs and CT.

## 2017-10-09 NOTE — Telephone Encounter (Signed)
Called and spoke with [name]. She is concerned that Pt did not know who she was, duration over an hour.

## 2017-10-10 ENCOUNTER — Other Ambulatory Visit: Payer: Self-pay

## 2017-10-10 ENCOUNTER — Ambulatory Visit: Payer: Medicare PPO

## 2017-10-10 DIAGNOSIS — G8929 Other chronic pain: Secondary | ICD-10-CM

## 2017-10-10 DIAGNOSIS — M48062 Spinal stenosis, lumbar region with neurogenic claudication: Secondary | ICD-10-CM

## 2017-10-10 DIAGNOSIS — M6281 Muscle weakness (generalized): Secondary | ICD-10-CM

## 2017-10-10 DIAGNOSIS — R29818 Other symptoms and signs involving the nervous system: Secondary | ICD-10-CM

## 2017-10-10 DIAGNOSIS — R293 Abnormal posture: Secondary | ICD-10-CM

## 2017-10-10 DIAGNOSIS — R279 Unspecified lack of coordination: Secondary | ICD-10-CM | POA: Diagnosis not present

## 2017-10-10 DIAGNOSIS — R296 Repeated falls: Secondary | ICD-10-CM | POA: Diagnosis not present

## 2017-10-10 DIAGNOSIS — G20A1 Parkinson's disease without dyskinesia, without mention of fluctuations: Secondary | ICD-10-CM

## 2017-10-10 DIAGNOSIS — R2681 Unsteadiness on feet: Secondary | ICD-10-CM

## 2017-10-10 DIAGNOSIS — M25559 Pain in unspecified hip: Secondary | ICD-10-CM

## 2017-10-10 DIAGNOSIS — M25551 Pain in right hip: Secondary | ICD-10-CM | POA: Diagnosis not present

## 2017-10-10 DIAGNOSIS — R278 Other lack of coordination: Secondary | ICD-10-CM | POA: Diagnosis not present

## 2017-10-10 DIAGNOSIS — R262 Difficulty in walking, not elsewhere classified: Secondary | ICD-10-CM

## 2017-10-10 DIAGNOSIS — M545 Low back pain, unspecified: Secondary | ICD-10-CM

## 2017-10-10 DIAGNOSIS — R2689 Other abnormalities of gait and mobility: Secondary | ICD-10-CM

## 2017-10-10 DIAGNOSIS — G20 Parkinson's disease: Secondary | ICD-10-CM

## 2017-10-10 DIAGNOSIS — R29898 Other symptoms and signs involving the musculoskeletal system: Secondary | ICD-10-CM

## 2017-10-10 DIAGNOSIS — R471 Dysarthria and anarthria: Secondary | ICD-10-CM

## 2017-10-10 NOTE — Therapy (Signed)
[place] MAIN [facility] SERVICES [address] [place], [place], [address] Phone: [phone]   Fax:  [phone]  Physical Therapy Treatment  Patient Details  Name: [name] MRN: [id number] Date of Birth: [birth year] Referring Provider: Wells Guiles Tat   Encounter Date: 10/10/2017  PT End of Session - 10/10/17 1513    Visit Number  42    Number of Visits  49    Date for PT Re-Evaluation  11/09/17    PT Start Time  0945    PT Stop Time  1030    PT Time Calculation (min)  45 min    Activity Tolerance  Patient tolerated treatment well    Behavior During Therapy  [facility] for tasks assessed/performed       Past Medical History:  Diagnosis Date  . Arthritis   . Bradycardia   . Cancer [facility]) 2013   skin cancer  . Depression    ptsd  . Dysrhythmia    chronic slow heart rate  . GERD (gastroesophageal reflux disease)   . Headache(784.0)    tension headaches non recent  . History of chicken pox   . History of kidney stones    passed  . Hypertension    treated with HCTZ  . Parkinson's disease ([place])    dx'ed 15 years ago  . PTSD (post-traumatic stress disorder)   . Shortness of breath dyspnea   . Sleep apnea    doesn't use C-pap  . Varicose veins     Past Surgical History:  Procedure Laterality Date  . CHOLECYSTECTOMY N/A 10/22/2014   Procedure: LAPAROSCOPIC CHOLECYSTECTOMY WITH INTRAOPERATIVE CHOLANGIOGRAM;  Surgeon: Dia Crawford III, MD;  Location: [facility] ORS;  Service: General;  Laterality: N/A;  . cyst removed      from lip as a child  . LUMBAR LAMINECTOMY/DECOMPRESSION MICRODISCECTOMY Bilateral 12/14/2012   Procedure: Bilateral lumbar three-four, four-five decompressive laminotomy/foraminotomy;  Surgeon: Charlie Pitter, MD;  Location: [place] NEURO ORS;  Service: Neurosurgery;  Laterality: Bilateral;  . PULSE GENERATOR IMPLANT Bilateral 12/13/2013   Procedure: Bilateral implantable pulse generator placement;  Surgeon: Erline Levine, MD;  Location:  [place] NEURO ORS;  Service: Neurosurgery;  Laterality: Bilateral;  Bilateral implantable pulse generator placement  . skin cancer removed     from ears,   12 lft arm  rt leg 15  . SUBTHALAMIC STIMULATOR BATTERY REPLACEMENT Bilateral 07/14/2017   Procedure: BILATERAL IMPLANTED PULSE GENERATOR CHANGE FOR DEEP BRAIN STIMULATOR;  Surgeon: Erline Levine, MD;  Location: [place];  Service: Neurosurgery;  Laterality: Bilateral;  . SUBTHALAMIC STIMULATOR INSERTION Bilateral 12/06/2013   Procedure: SUBTHALAMIC STIMULATOR INSERTION;  Surgeon: Erline Levine, MD;  Location: [place] NEURO ORS;  Service: Neurosurgery;  Laterality: Bilateral;  Bilateral deep brain stimulator placement    There were no vitals filed for this visit.  Subjective Assessment - 10/10/17 1511    Subjective  No voiced complaints post last session or currently. Denies any falls this past week.     Pertinent History  Patient referred for physical therapy due to wife's request for patient to get aquatic therapy to help with his conditioning. Pt is also complaining of chronic low back and R hip pain and frequent falls. 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 The last time he was in the water he was very uncomfortable because he didn't feel like he had any control. Pt has a history of 2 low back surgeries  in August 2014 and February 2016. Pt and wife are unable to recall exactly what was performed but they know that he had stenosis. Surgeries were performed by Dr. Annette Stable in [place]. Pt reports that his low back and R hip pain are constant. He describes the pain in the back as "dull" and the pain in the hip as "sharp." For both back and hip worst pain is 10/10, Best: 2/10, Present: 2/10. No particular episode of trauma to his low back or hip preceding the pain. Pt has a history of Parkinson's with a deep brain stimulator. He ambulates with a 4 wheeled walker called a U-step (automatic brakes, audio cues  for stepping). He states that he falls frequently and reports that he has had approximately 40-50 falls in the last 6 months. No specific recent changes in his health.       Enters/exits via ramp  Ambulation/balance, blue dumbbells  4 L fwd  2 L side  2 L side with squat  2 L high march  2 L bkwd  Bench  STS without UE, 25x  Dynamic Balance, B 10x each  Sword swings with stepping  Baseball swing with stepping  Static balance, B 10x ea  Pivot and rotation with focal points, down L to up and right and opposite   10x each.                             PT Education - 10/10/17 1512    Education provided  Yes    Education Details  continued balance work and correction with LOB    Person(s) Educated  Patient    Methods  Explanation;Demonstration    Comprehension  Verbalized understanding;Returned demonstration;Verbal cues required;Tactile cues required       PT Short Term Goals - 07/19/17 1741      PT SHORT TERM GOAL #1   Title  Pt will perform HEP with family's supervision, for improved balance, transfers, and gait.      Baseline  patient reports compliance, however not every day    Time  4    Period  Weeks    Status  Partially Met      PT SHORT TERM GOAL #2   Title  Pt will perform 5x sit<>stand in less than or equal to 15 seconds for improved efficiency and safety with transfers.    Baseline  19 seconds; deffered at this time due to excessive LOB     Time  2    Period  Weeks    Status  On-going        PT Long Term Goals - 09/14/17 1093      PT LONG TERM GOAL #1   Title  Pt will decrease 5TSTS by at least 3 seconds in order to demonstrate clinically significant improvement in LE strength     Baseline  02/08/17: 20.1 seconds 12/18: 17 seconds ; 09/14/17 =24.34 sec with assist due to poor static standing balance    Time  8    Period  Weeks    Status  Achieved      PT LONG TERM GOAL #2   Title  Pt will decrease TUG by at least 3.4s in order  to demonstrate decreased fall risk     Baseline  02/08/17: 22.1s 12/18: 20 seconds 2/7: 19 seconds; 4/3: 23 seconds with walker, 09/14/1913.12 sec    Time  8    Period  Weeks  Status  Partially Met    Target Date  11/09/17      PT LONG TERM GOAL #3   Title  Pt will improve BERG by at least 3 points in order to demonstrate clinically significant improvement in balance.    Baseline  02/08/17: 30/56 12/18: 38/56      Time  8    Period  Weeks    Status  Achieved    Target Date  --      PT LONG TERM GOAL #4   Title  Pt/wife will verbalize understanding of plans for continued community fitness upon D/C from PT including aquatic therapy program    Baseline  02/08/17: pt currently reports being more comfortable in the water    Time  8    Period  Weeks    Status  Partially Met    Target Date  11/09/17      PT LONG TERM GOAL #5   Title  Pt will decrease mODI scoreby at least 13 points in order demonstrate clinically significant reduction in pain/disability     Baseline  02/08/17: 54% 12/18: 46% 2/7: 42% 4/3; 50%, 09/14/17 42%    Time  8    Period  Weeks    Status  Partially Met    Target Date  11/09/17      PT LONG TERM GOAL #6   Title  Pt will improve BERG to 44/56  in order to demonstrate clinically significant improvement in balance for decreased fall risk    Baseline  12/18: 38/56 2/7: 41/56 4/3: 41/56, 09/14/17  19/56    Time  8    Period  Weeks    Status  Partially Met    Target Date  11/09/17            Plan - 10/10/17 1514    Clinical Impression Statement  Pt demonstrates greater times of maintaining balance this session with both static and dynamic activity with less need for assistance, but times when needs assistance often Mod to Max A. With entering and exiting water and to/from w/c pt continues to demonstrate quick/impulsive movements and unsafe transfer technique. Re educate each time    Rehab Potential  Fair    PT Frequency  2x / week    PT Duration  8 weeks     PT Treatment/Interventions  ADLs/Self Care Home Management;Aquatic Therapy;Cryotherapy;Electrical Stimulation;Iontophoresis 45m/ml Dexamethasone;Moist Heat;Ultrasound;Contrast Bath;DME Instruction;Gait training;Stair training;Functional mobility training;Therapeutic activities;Therapeutic exercise;Balance training;Neuromuscular re-education;Patient/family education;Manual techniques;Wheelchair mobility training;Energy conservation;Passive range of motion    PT Next Visit Plan  progress balance, strength, aquatic therapy    PT Home Exercise Plan  None provided at this time until further assessed, wife reports pt does not perform [facility]    Consulted and Agree with Plan of Care  Patient       Patient will benefit from skilled therapeutic intervention in order to improve the following deficits and impairments:  Abnormal gait, Decreased balance, Decreased coordination, Decreased mobility, Impaired tone, Postural dysfunction  Visit Diagnosis: Muscle weakness (generalized)  Other lack of coordination  Other abnormalities of gait and mobility  Repeated falls  Unsteadiness on feet  Unspecified lack of coordination  Abnormal posture  Arthralgia of hip, unspecified laterality  Pain in right hip  Difficulty walking  Parkinson's disease ([place]  Spinal stenosis, lumbar region, with neurogenic claudication  Other symptoms and signs involving the musculoskeletal system  Chronic midline low back pain without sciatica  Dysarthria and anarthria  Other  symptoms and signs involving the nervous system     Problem List Patient Active Problem List   Diagnosis Date Noted  . 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 Radicular pain in right arm 10/09/2016  . Rash 09/03/2016  . GERD (gastroesophageal reflux disease) 07/31/2016  . Trochanteric bursitis 03/03/2016  . Left hand pain 10/30/2015  . Fracture, finger, multiple sites 10/30/2015  . Colon cancer screening 07/23/2015  .  Healthcare maintenance 07/23/2015  . Gout 02/18/2015  . Depression 01/06/2015  . Irritation of eyelid 08/20/2014  . Dupuytren's contracture 08/20/2014  . Cough 07/21/2014  . Lumbar stenosis with neurogenic claudication 06/13/2014  . Preop cardiovascular exam 05/28/2014  . S/P deep brain stimulator placement 05/08/2014  . Joint pain 01/22/2014  . Medicare annual wellness visit, initial 01/19/2014  . Advance care planning 01/19/2014  . Parkinson's disease ([place]) 12/13/2013  . PTSD (post-traumatic stress disorder) 06/13/2013  . Erectile dysfunction 06/13/2013  . HLD (hyperlipidemia) 06/13/2013  . Hip pain 06/10/2013  . Pain in joint, shoulder region 06/10/2013  . Right leg swelling 06/03/2013  . Essential hypertension 06/03/2013  . Bradycardia by electrocardiogram 06/03/2013  . Obstructive sleep apnea 03/12/2013  . Spinal stenosis, lumbar region, with neurogenic claudication 12/14/2012    Larae Grooms 10/10/2017, 3:18 PM  [place] MAIN [facility] SERVICES [place], [place], [address] Phone: [phone]   Fax:  [phone]  Name: [name] MRN: [id number] Date of Birth: 05-01-1950

## 2017-10-11 ENCOUNTER — Encounter: Payer: Self-pay | Admitting: Family Medicine

## 2017-10-11 ENCOUNTER — Other Ambulatory Visit (INDEPENDENT_AMBULATORY_CARE_PROVIDER_SITE_OTHER): Payer: Medicare PPO

## 2017-10-11 ENCOUNTER — Ambulatory Visit
Admission: RE | Admit: 2017-10-11 | Discharge: 2017-10-11 | Disposition: A | Discharge status: Home / Self Care | Payer: Medicare PPO | Source: Ambulatory Visit | Attending: Neurology | Admitting: Neurology

## 2017-10-11 DIAGNOSIS — R410 Disorientation, unspecified: Secondary | ICD-10-CM | POA: Diagnosis not present

## 2017-10-11 DIAGNOSIS — G319 Degenerative disease of nervous system, unspecified: Secondary | ICD-10-CM | POA: Diagnosis not present

## 2017-10-12 ENCOUNTER — Ambulatory Visit: Payer: Medicare PPO | Admitting: Occupational Therapy

## 2017-10-12 ENCOUNTER — Telehealth: Payer: Self-pay | Admitting: *Deleted

## 2017-10-12 ENCOUNTER — Ambulatory Visit: Payer: Medicare PPO

## 2017-10-12 ENCOUNTER — Encounter: Payer: Self-pay | Admitting: Occupational Therapy

## 2017-10-12 DIAGNOSIS — M25551 Pain in right hip: Secondary | ICD-10-CM | POA: Diagnosis not present

## 2017-10-12 DIAGNOSIS — R296 Repeated falls: Secondary | ICD-10-CM | POA: Diagnosis not present

## 2017-10-12 DIAGNOSIS — R279 Unspecified lack of coordination: Secondary | ICD-10-CM | POA: Diagnosis not present

## 2017-10-12 DIAGNOSIS — R293 Abnormal posture: Secondary | ICD-10-CM | POA: Diagnosis not present

## 2017-10-12 DIAGNOSIS — M6281 Muscle weakness (generalized): Secondary | ICD-10-CM

## 2017-10-12 DIAGNOSIS — R278 Other lack of coordination: Secondary | ICD-10-CM

## 2017-10-12 DIAGNOSIS — R2689 Other abnormalities of gait and mobility: Secondary | ICD-10-CM

## 2017-10-12 DIAGNOSIS — R2681 Unsteadiness on feet: Secondary | ICD-10-CM | POA: Diagnosis not present

## 2017-10-12 DIAGNOSIS — M25559 Pain in unspecified hip: Secondary | ICD-10-CM | POA: Diagnosis not present

## 2017-10-12 LAB — URINALYSIS
Bilirubin Urine: NEGATIVE
HGB URINE DIPSTICK: NEGATIVE
Ketones, ur: NEGATIVE
Leukocytes, UA: NEGATIVE
Nitrite: NEGATIVE
Specific Gravity, Urine: >=1.03 — AB (ref 1.000–1.030)
TOTAL PROTEIN, URINE-UPE24: NEGATIVE
Urine Glucose: NEGATIVE
Urobilinogen, UA: 0.2 (ref 0.0–1.0)
pH: 6 (ref 5.0–8.0)

## 2017-10-12 LAB — CBC WITH DIFFERENTIAL/PLATELET
BASOS ABS: 0.1 10*3/uL (ref 0.0–0.1)
BASOS PCT: 1 % (ref 0.0–3.0)
EOS ABS: 0.1 10*3/uL (ref 0.0–0.7)
EOS PCT: 1.1 % (ref 0.0–5.0)
HCT: 44.2 % (ref 39.0–52.0)
HEMOGLOBIN: 15.3 g/dL (ref 13.0–17.0)
LYMPHS PCT: 25.2 % (ref 12.0–46.0)
Lymphs Abs: 2.2 10*3/uL (ref 0.7–4.0)
MCHC: 34.6 g/dL (ref 30.0–36.0)
MCV: 89.7 fl (ref 78.0–100.0)
MONOS PCT: 7.8 % (ref 3.0–12.0)
Monocytes Absolute: 0.7 10*3/uL (ref 0.1–1.0)
NEUTROS ABS: 5.7 10*3/uL (ref 1.4–7.7)
NEUTROS PCT: 64.9 % (ref 43.0–77.0)
Platelets: 206 10*3/uL (ref 150.0–400.0)
RBC: 4.92 Mil/uL (ref 4.22–5.81)
RDW: 13.4 % (ref 11.5–15.5)
WBC: 8.8 10*3/uL (ref 4.0–10.5)

## 2017-10-12 LAB — BASIC METABOLIC PANEL
BUN: 21 mg/dL (ref 6–23)
CHLORIDE: 106 meq/L (ref 96–112)
CO2: 28 mEq/L (ref 19–32)
CREATININE: 1.15 mg/dL (ref 0.40–1.50)
Calcium: 9.2 mg/dL (ref 8.4–10.5)
GFR: 67.14 mL/min (ref >60.00)
Glucose, Bld: 116 mg/dL — ABNORMAL HIGH (ref 70–99)
Potassium: 4 mEq/L (ref 3.5–5.1)
Sodium: 142 mEq/L (ref 135–145)

## 2017-10-12 NOTE — Telephone Encounter (Signed)
-----   Message from [place], DO sent at 10/12/2017 10:54 AM EDT ----- Let pt/wife know that CT is stable

## 2017-10-12 NOTE — Telephone Encounter (Signed)
Patients wife notified

## 2017-10-12 NOTE — Telephone Encounter (Signed)
-----   Message from [place], DO sent at 10/12/2017 11:54 AM EDT ----- Let pt/wife know that I didn't see anything particularly unusual in his labs

## 2017-10-12 NOTE — Therapy (Signed)
[place] MAIN [facility] SERVICES [address] [place], [place], [address] Phone: [phone]   Fax:  [phone]  Occupational Therapy Treatment  Patient Details  Name: [name] MRN: [id number] Date of Birth: [birth year] Referring Provider: Wells Guiles Tat   Encounter Date: 10/12/2017  OT End of Session - 10/12/17 0854    Visit Number  15    Number of Visits  24    Date for OT Re-Evaluation  10/23/17    Authorization Type  Visit 5 of 10 for progress reporting period starting 07/31/2017    OT Start Time  0845    OT Stop Time  0930    OT Time Calculation (min)  45 min    Activity Tolerance  Patient tolerated treatment well    Behavior During Therapy  [facility] for tasks assessed/performed       Past Medical History:  Diagnosis Date  . Arthritis   . Bradycardia   . Cancer [facility]) 2013   skin cancer  . Depression    ptsd  . Dysrhythmia    chronic slow heart rate  . GERD (gastroesophageal reflux disease)   . Headache(784.0)    tension headaches non recent  . History of chicken pox   . History of kidney stones    passed  . Hypertension    treated with HCTZ  . Parkinson's disease ([place])    dx'ed 15 years ago  . PTSD (post-traumatic stress disorder)   . Shortness of breath dyspnea   . Sleep apnea    doesn't use C-pap  . Varicose veins     Past Surgical History:  Procedure Laterality Date  . CHOLECYSTECTOMY N/A 10/22/2014   Procedure: LAPAROSCOPIC CHOLECYSTECTOMY WITH INTRAOPERATIVE CHOLANGIOGRAM;  Surgeon: Dia Crawford III, MD;  Location: [facility] ORS;  Service: General;  Laterality: N/A;  . cyst removed      from lip as a child  . LUMBAR LAMINECTOMY/DECOMPRESSION MICRODISCECTOMY Bilateral 12/14/2012   Procedure: Bilateral lumbar three-four, four-five decompressive laminotomy/foraminotomy;  Surgeon: Charlie Pitter, MD;  Location: [place] NEURO ORS;  Service: Neurosurgery;  Laterality: Bilateral;  . PULSE GENERATOR IMPLANT Bilateral 12/13/2013   Procedure: Bilateral implantable pulse generator placement;  Surgeon: Erline Levine, MD;  Location: [place] NEURO ORS;  Service: Neurosurgery;  Laterality: Bilateral;  Bilateral implantable pulse generator placement  . skin cancer removed     from ears,   12 lft arm  rt leg 15  . SUBTHALAMIC STIMULATOR BATTERY REPLACEMENT Bilateral 07/14/2017   Procedure: BILATERAL IMPLANTED PULSE GENERATOR CHANGE FOR DEEP BRAIN STIMULATOR;  Surgeon: Erline Levine, MD;  Location: [place];  Service: Neurosurgery;  Laterality: Bilateral;  . SUBTHALAMIC STIMULATOR INSERTION Bilateral 12/06/2013   Procedure: SUBTHALAMIC STIMULATOR INSERTION;  Surgeon: Erline Levine, MD;  Location: [place] NEURO ORS;  Service: Neurosurgery;  Laterality: Bilateral;  Bilateral deep brain stimulator placement    There were no vitals filed for this visit.  OT TREATMENT    Neuro muscular re-education:  Pt. worked on grasping, tying, and untying various sized knots. Pt. worked on grasping, manipulating resistive beads connecting, and disconnecting them suing 2pt., 3pt. And lateral pinch grasp. Pt. dropped multiple beads.                           OT Education - 10/12/17 0854    Education provided  Yes    Education Details  [facility]    Person(s) Educated  Patient    Methods  Demonstration;Verbal cues;Explanation  Comprehension  Verbal cues required;Verbalized understanding          OT Long Term Goals - 09/14/17 0954      OT LONG TERM GOAL #1   Title  Pt. will improve Bilateral UE strength by 1 mm grade to assist with ADLs, and IADLs    Baseline  EVal: BUE strength: 4+/5 overall    Time  12    Period  Weeks    Status  On-going    Target Date  10/23/17      OT LONG TERM GOAL #2   Title  Pt. will improve grip strength to be able to pour himself a beverage.    Baseline  Eval: Pt. is unable to hold and pour a beevrage.    Time  12    Period  Weeks    Status  On-going    Target Date  10/23/17      OT LONG TERM GOAL  #3   Title  Pt. will improve lateral pinch stength to be able to independently turn a key.     Baseline  Eval: Pt. has difficulty holding, and turning  key.    Time  12    Period  Weeks    Status  On-going    Target Date  10/23/17      OT LONG TERM GOAL #4   Title  Pt. will improve Right hand [facility] skills to be able to independently grasp and use small objects needed for fixing car parts.    Baseline  Eval: Right hand 1 min. & 5 sec. on the 9 hole peg test.    Time  12    Period  Weeks    Status  On-going    Target Date  10/23/17      OT LONG TERM GOAL #5   Title  Pt. will be demonstarte self-feeding with modified independence with minimal spillage.    Baseline  Eval: Pt. requires assistance, and has alot of spillage.    Time  12    Period  Weeks    Status  On-going    Target Date  10/23/17      Long Term Additional Goals   Additional Long Term Goals  Yes      OT LONG TERM GOAL #6   Title  Pt. will manipulate buttons, and cuff buttons independently.    Baseline  Eval: Pt. has difficulty     Time  12    Period  Weeks    Status  On-going    Target Date  10/23/17      OT LONG TERM GOAL #7   Title  Pt. will write 1 sentence efficiently with 90% legibility.    Baseline  Eval: name only 50% legibility    Time  12    Period  Weeks    Status  On-going    Target Date  10/23/17      OT LONG TERM GOAL #8   Title  Pt. will improve typing speed, and accuracy to be able to to type emails efficently    Baseline  09/14/2017: Typing speed: 3 wpm, Accuracy: 41% after 3 attempts with moderate cues for hand placement    Time  12    Period  Weeks    Status  New    Target Date  12/07/17            Plan - 10/12/17 0855    Clinical Impression Statement  Pt.  reports he  had approximately 4 falls since the last OT visit. Pt. continues to work on improving UE strength, and fine motor coordination skills to assist with LE ADLs in preparation for light ADL tasks including: writing.      Occupational performance deficits (Please refer to evaluation for details):  ADL's;IADL's    Rehab Potential  Good    Current Impairments/barriers affecting progress:  Positive Indicators: age, familiy support, motivation. Negative Indicators: Multiple comorbidities, and history of falls.    OT Frequency  2x / week    OT Duration  12 weeks    OT Treatment/Interventions  Self-care/ADL training;Therapeutic exercise;DME and/or AE instruction;Energy conservation;Neuromuscular education;Patient/family education;Therapeutic activities    Clinical Decision Making  Several treatment options, min-mod task modification necessary    OT Home Exercise Plan  HEP for using rubber bands for strengthening at home and work on more handwriting.    Consulted and Agree with Plan of Care  Patient       Patient will benefit from skilled therapeutic intervention in order to improve the following deficits and impairments:  Pain, Decreased strength, Decreased coordination, Decreased balance, Impaired UE functional use, Decreased safety awareness, Decreased activity tolerance, Decreased cognition, Impaired flexibility, Impaired vision/preception  Visit Diagnosis: Muscle weakness (generalized)  Other lack of coordination    Problem List Patient Active Problem List   Diagnosis Date Noted  . 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 Radicular pain in right arm 10/09/2016  . Rash 09/03/2016  . GERD (gastroesophageal reflux disease) 07/31/2016  . Trochanteric bursitis 03/03/2016  . Left hand pain 10/30/2015  . Fracture, finger, multiple sites 10/30/2015  . Colon cancer screening 07/23/2015  . Healthcare maintenance 07/23/2015  . Gout 02/18/2015  . Depression 01/06/2015  . Irritation of eyelid 08/20/2014  . Dupuytren's contracture 08/20/2014  . Cough 07/21/2014  . Lumbar stenosis with neurogenic claudication 06/13/2014  . Preop cardiovascular exam 05/28/2014  . S/P deep brain stimulator placement 05/08/2014  .  Joint pain 01/22/2014  . Medicare annual wellness visit, initial 01/19/2014  . Advance care planning 01/19/2014  . Parkinson's disease ([place]) 12/13/2013  . PTSD (post-traumatic stress disorder) 06/13/2013  . Erectile dysfunction 06/13/2013  . HLD (hyperlipidemia) 06/13/2013  . Hip pain 06/10/2013  . Pain in joint, shoulder region 06/10/2013  . Right leg swelling 06/03/2013  . Essential hypertension 06/03/2013  . Bradycardia by electrocardiogram 06/03/2013  . Obstructive sleep apnea 03/12/2013  . Spinal stenosis, lumbar region, with neurogenic claudication 12/14/2012    Harrel Carina, MS, OTR/L 10/12/2017, 9:03 AM  [place] MAIN [facility] SERVICES [place], [place], [address] Phone: [phone]   Fax:  [phone]  Name: NIYAM BISPING MRN: [id number] Date of Birth: 05-20-1950

## 2017-10-12 NOTE — Therapy (Signed)
[place] MAIN [facility] SERVICES [address] [place], [place], [address] Phone: [phone]   Fax:  [phone]  Physical Therapy Treatment  Patient Details  Name: [name] MRN: [id number] Date of Birth: 1951/03/10 Referring Provider: Wells Guiles Tat   Encounter Date: 10/12/2017  PT End of Session - 10/12/17 0813    Visit Number  43    Number of Visits  49    Date for PT Re-Evaluation  11/09/17    PT Start Time  0801    PT Stop Time  0845    PT Time Calculation (min)  44 min    Equipment Utilized During Treatment  Gait belt    Activity Tolerance  Patient tolerated treatment well    Behavior During Therapy  [facility] for tasks assessed/performed       Past Medical History:  Diagnosis Date  . Arthritis   . Bradycardia   . Cancer [facility]) 2013   skin cancer  . Depression    ptsd  . Dysrhythmia    chronic slow heart rate  . GERD (gastroesophageal reflux disease)   . Headache(784.0)    tension headaches non recent  . History of chicken pox   . History of kidney stones    passed  . Hypertension    treated with HCTZ  . Parkinson's disease ([place])    dx'ed 15 years ago  . PTSD (post-traumatic stress disorder)   . Shortness of breath dyspnea   . Sleep apnea    doesn't use C-pap  . Varicose veins     Past Surgical History:  Procedure Laterality Date  . CHOLECYSTECTOMY N/A 10/22/2014   Procedure: LAPAROSCOPIC CHOLECYSTECTOMY WITH INTRAOPERATIVE CHOLANGIOGRAM;  Surgeon: Dia Crawford III, MD;  Location: [facility] ORS;  Service: General;  Laterality: N/A;  . cyst removed      from lip as a child  . LUMBAR LAMINECTOMY/DECOMPRESSION MICRODISCECTOMY Bilateral 12/14/2012   Procedure: Bilateral lumbar three-four, four-five decompressive laminotomy/foraminotomy;  Surgeon: Charlie Pitter, MD;  Location: [place] NEURO ORS;  Service: Neurosurgery;  Laterality: Bilateral;  . PULSE GENERATOR IMPLANT Bilateral 12/13/2013   Procedure: Bilateral implantable pulse generator  placement;  Surgeon: Erline Levine, MD;  Location: [place] NEURO ORS;  Service: Neurosurgery;  Laterality: Bilateral;  Bilateral implantable pulse generator placement  . skin cancer removed     from ears,   12 lft arm  rt leg 15  . SUBTHALAMIC STIMULATOR BATTERY REPLACEMENT Bilateral 07/14/2017   Procedure: BILATERAL IMPLANTED PULSE GENERATOR CHANGE FOR DEEP BRAIN STIMULATOR;  Surgeon: Erline Levine, MD;  Location: [place];  Service: Neurosurgery;  Laterality: Bilateral;  . SUBTHALAMIC STIMULATOR INSERTION Bilateral 12/06/2013   Procedure: SUBTHALAMIC STIMULATOR INSERTION;  Surgeon: Erline Levine, MD;  Location: [place] NEURO ORS;  Service: Neurosurgery;  Laterality: Bilateral;  Bilateral deep brain stimulator placement    There were no vitals filed for this visit.  Subjective Assessment - 10/12/17 0808    Subjective  Patient and wife report patient fell yesterday onto trash can. Has noticeable cuts/bruising on L arm. Reports back pain from fall. Attempted to get up without AD.     Pertinent History  Patient referred for physical therapy due to wife's request for patient to get aquatic therapy to help with his conditioning. Pt is also complaining of chronic low back and R hip pain and frequent falls. 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 The last time he was in the  water he was very uncomfortable because he didn't feel like he had any control. Pt has a history of 2 low back surgeries in August 2014 and February 2016. Pt and wife are unable to recall exactly what was performed but they know that he had stenosis. Surgeries were performed by Dr. Annette Stable in [place]. Pt reports that his low back and R hip pain are constant. He describes the pain in the back as "dull" and the pain in the hip as "sharp." For both back and hip worst pain is 10/10, Best: 2/10, Present: 2/10. No particular episode of trauma to his low back or hip preceding the pain. Pt has a history of  Parkinson's with a deep brain stimulator. He ambulates with a 4 wheeled walker called a U-step (automatic brakes, audio cues for stepping). He states that he falls frequently and reports that he has had approximately 40-50 falls in the last 6 months. No specific recent changes in his health.     Currently in Pain?  Yes    Pain Score  7     Pain Location  Back    Pain Orientation  Left    Pain Descriptors / Indicators  Aching    Pain Type  Acute pain    Pain Onset  Yesterday    Pain Frequency  Constant        -Nustep L4, 3 minutes for warm-up (unbilled);     -Standing marching 5# ankle weight x 15; 2 sets  bilateral, BUE support; dowel between feet to widen BOS   -Standing abduction 5# ankle weight x 15 bilateral, BUE support;   -Standing hamstring curl 5# ankle weight x 15 bilateral, BUE support;   -Standing hip extension 5# ankle weight x 10 bilateral, BUE support; 2 sets    -Standing hip flexion with straight leg 5# ankle weight x 15 bilateral, BUE support   -seated LAQ 10x each leg 3 second holds    -seated adduction squeezes 20x     -seated marches 5lb ankle weights 10x ; cues for upright posture     Neuromuscular Re-education   6" step step-ups alternating LE x 12 each, verbal cues for L and R for pace.    -Airex pad balance with feet apart eyes closed 5 x 30s,    -speed ladder: one foot in each box to improve smoothness/coordination of ambulation and step length. SUE support 8x length of bars       Pt. response to medical necessity: Patient will continue to benefit from skilled physical therapy to improve static and dynamic standing balance and decrease fall risk for improved functional capacity of activities                         PT Education - 10/12/17 0810    Education provided  Yes    Education Details  balance, correction of LOB, strengthening    Person(s) Educated  Patient    Methods  Explanation;Demonstration;Verbal cues     Comprehension  Verbalized understanding;Returned demonstration       PT Short Term Goals - 07/19/17 1741      PT SHORT TERM GOAL #1   Title  Pt will perform HEP with family's supervision, for improved balance, transfers, and gait.      Baseline  patient reports compliance, however not every day    Time  4    Period  Weeks    Status  Partially Met  PT SHORT TERM GOAL #2   Title  Pt will perform 5x sit<>stand in less than or equal to 15 seconds for improved efficiency and safety with transfers.    Baseline  19 seconds; deffered at this time due to excessive LOB     Time  2    Period  Weeks    Status  On-going        PT Long Term Goals - 09/14/17 5465      PT LONG TERM GOAL #1   Title  Pt will decrease 5TSTS by at least 3 seconds in order to demonstrate clinically significant improvement in LE strength     Baseline  02/08/17: 20.1 seconds 12/18: 17 seconds ; 09/14/17 =24.34 sec with assist due to poor static standing balance    Time  8    Period  Weeks    Status  Achieved      PT LONG TERM GOAL #2   Title  Pt will decrease TUG by at least 3.4s in order to demonstrate decreased fall risk     Baseline  02/08/17: 22.1s 12/18: 20 seconds 2/7: 19 seconds; 4/3: 23 seconds with walker, 09/14/1913.12 sec    Time  8    Period  Weeks    Status  Partially Met    Target Date  11/09/17      PT LONG TERM GOAL #3   Title  Pt will improve BERG by at least 3 points in order to demonstrate clinically significant improvement in balance.    Baseline  02/08/17: 30/56 12/18: 38/56      Time  8    Period  Weeks    Status  Achieved    Target Date  --      PT LONG TERM GOAL #4   Title  Pt/wife will verbalize understanding of plans for continued community fitness upon D/C from PT including aquatic therapy program    Baseline  02/08/17: pt currently reports being more comfortable in the water    Time  8    Period  Weeks    Status  Partially Met    Target Date  11/09/17      PT LONG TERM  GOAL #5   Title  Pt will decrease mODI scoreby at least 13 points in order demonstrate clinically significant reduction in pain/disability     Baseline  02/08/17: 54% 12/18: 46% 2/7: 42% 4/3; 50%, 09/14/17 42%    Time  8    Period  Weeks    Status  Partially Met    Target Date  11/09/17      PT LONG TERM GOAL #6   Title  Pt will improve BERG to 44/56  in order to demonstrate clinically significant improvement in balance for decreased fall risk    Baseline  12/18: 38/56 2/7: 41/56 4/3: 41/56, 09/14/17  19/56    Time  8    Period  Weeks    Status  Partially Met    Target Date  11/09/17            Plan - 10/12/17 0833    Clinical Impression Statement  Patient demonstrates narrow BOS in standing dynamic interventions requiring physical cueing for widening BOS. Improved control of postural correction noted with increased repetition of eyes closed on airex pad resulting in activation of anterior musculature to balance the posterior trunk lean. Patient will continue to benefit from skilled physical therapy to improve static and dynamic standing balance and decrease fall risk  for improved functional capacity of activities    Rehab Potential  Fair    PT Frequency  2x / week    PT Duration  8 weeks    PT Treatment/Interventions  ADLs/Self Care Home Management;Aquatic Therapy;Cryotherapy;Electrical Stimulation;Iontophoresis '4mg'$ /ml Dexamethasone;Moist Heat;Ultrasound;Contrast Bath;DME Instruction;Gait training;Stair training;Functional mobility training;Therapeutic activities;Therapeutic exercise;Balance training;Neuromuscular re-education;Patient/family education;Manual techniques;Wheelchair mobility training;Energy conservation;Passive range of motion    PT Next Visit Plan  progress balance, strength, aquatic therapy    PT Home Exercise Plan  None provided at this time until further assessed, wife reports pt does not perform current HEP from [facility]    Consulted and Agree with Plan of Care  Patient        Patient will benefit from skilled therapeutic intervention in order to improve the following deficits and impairments:  Abnormal gait, Decreased balance, Decreased coordination, Decreased mobility, Impaired tone, Postural dysfunction  Visit Diagnosis: Muscle weakness (generalized)  Other lack of coordination  Other abnormalities of gait and mobility  Repeated falls     Problem List Patient Active Problem List   Diagnosis Date Noted  . 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 Radicular pain in right arm 10/09/2016  . Rash 09/03/2016  . GERD (gastroesophageal reflux disease) 07/31/2016  . Trochanteric bursitis 03/03/2016  . Left hand pain 10/30/2015  . Fracture, finger, multiple sites 10/30/2015  . Colon cancer screening 07/23/2015  . Healthcare maintenance 07/23/2015  . Gout 02/18/2015  . Depression 01/06/2015  . Irritation of eyelid 08/20/2014  . Dupuytren's contracture 08/20/2014  . Cough 07/21/2014  . Lumbar stenosis with neurogenic claudication 06/13/2014  . Preop cardiovascular exam 05/28/2014  . S/P deep brain stimulator placement 05/08/2014  . Joint pain 01/22/2014  . Medicare annual wellness visit, initial 01/19/2014  . Advance care planning 01/19/2014  . Parkinson's disease ([place]) 12/13/2013  . PTSD (post-traumatic stress disorder) 06/13/2013  . Erectile dysfunction 06/13/2013  . HLD (hyperlipidemia) 06/13/2013  . Hip pain 06/10/2013  . Pain in joint, shoulder region 06/10/2013  . Right leg swelling 06/03/2013  . Essential hypertension 06/03/2013  . Bradycardia by electrocardiogram 06/03/2013  . Obstructive sleep apnea 03/12/2013  . Spinal stenosis, lumbar region, with neurogenic claudication 12/14/2012   Janna Arch, PT, DPT   10/12/2017, 8:45 AM  [place] MAIN [facility] SERVICES [place], [place], [address] Phone: [phone]   Fax:  [phone]  Name: [name] MRN: [id number] Date of  Birth: [birth year]

## 2017-10-17 ENCOUNTER — Encounter: Payer: Self-pay | Admitting: *Deleted

## 2017-10-17 ENCOUNTER — Ambulatory Visit (INDEPENDENT_AMBULATORY_CARE_PROVIDER_SITE_OTHER): Payer: Medicare PPO | Admitting: Psychology

## 2017-10-17 ENCOUNTER — Telehealth: Payer: Self-pay

## 2017-10-17 DIAGNOSIS — F331 Major depressive disorder, recurrent, moderate: Secondary | ICD-10-CM | POA: Diagnosis not present

## 2017-10-17 DIAGNOSIS — F4323 Adjustment disorder with mixed anxiety and depressed mood: Secondary | ICD-10-CM | POA: Diagnosis not present

## 2017-10-17 NOTE — Telephone Encounter (Signed)
[name] (DPR signed) said pt fell into a trash can on 10/12/17; pt falls due to parkinson; now pt has a large bruise on lt lower back and hip area; the bruise is large and comes out about 1" as a knot. Pt can walk without pain but the area is tender to touch. Pt has appt to see Dr Diona Browner on 10/18/17 at 10:45. If pt condition worsens prior to appt pt will go to ED. FYI to Dr Diona Browner. Pt is not on blood thinner.

## 2017-10-18 ENCOUNTER — Ambulatory Visit: Payer: Medicare PPO | Attending: Neurology | Admitting: Occupational Therapy

## 2017-10-18 ENCOUNTER — Encounter: Payer: Self-pay | Admitting: Occupational Therapy

## 2017-10-18 ENCOUNTER — Encounter: Payer: Self-pay | Admitting: Family Medicine

## 2017-10-18 ENCOUNTER — Ambulatory Visit: Payer: Medicare PPO

## 2017-10-18 ENCOUNTER — Ambulatory Visit (INDEPENDENT_AMBULATORY_CARE_PROVIDER_SITE_OTHER): Payer: Medicare PPO | Admitting: Family Medicine

## 2017-10-18 VITALS — BP 102/70 | HR 50 | Temp 97.9°F | Ht 68.0 in | Wt 185.0 lb

## 2017-10-18 DIAGNOSIS — R2681 Unsteadiness on feet: Secondary | ICD-10-CM | POA: Diagnosis not present

## 2017-10-18 DIAGNOSIS — R278 Other lack of coordination: Secondary | ICD-10-CM | POA: Diagnosis not present

## 2017-10-18 DIAGNOSIS — R279 Unspecified lack of coordination: Secondary | ICD-10-CM | POA: Insufficient documentation

## 2017-10-18 DIAGNOSIS — M25559 Pain in unspecified hip: Secondary | ICD-10-CM | POA: Diagnosis not present

## 2017-10-18 DIAGNOSIS — G8929 Other chronic pain: Secondary | ICD-10-CM | POA: Insufficient documentation

## 2017-10-18 DIAGNOSIS — M6281 Muscle weakness (generalized): Secondary | ICD-10-CM | POA: Insufficient documentation

## 2017-10-18 DIAGNOSIS — W19XXXA Unspecified fall, initial encounter: Secondary | ICD-10-CM | POA: Diagnosis not present

## 2017-10-18 DIAGNOSIS — T148XXA Other injury of unspecified body region, initial encounter: Secondary | ICD-10-CM | POA: Diagnosis not present

## 2017-10-18 DIAGNOSIS — R471 Dysarthria and anarthria: Secondary | ICD-10-CM | POA: Diagnosis not present

## 2017-10-18 DIAGNOSIS — R2689 Other abnormalities of gait and mobility: Secondary | ICD-10-CM | POA: Insufficient documentation

## 2017-10-18 DIAGNOSIS — R262 Difficulty in walking, not elsewhere classified: Secondary | ICD-10-CM | POA: Insufficient documentation

## 2017-10-18 DIAGNOSIS — M545 Low back pain: Secondary | ICD-10-CM | POA: Diagnosis not present

## 2017-10-18 DIAGNOSIS — R293 Abnormal posture: Secondary | ICD-10-CM | POA: Insufficient documentation

## 2017-10-18 DIAGNOSIS — R29898 Other symptoms and signs involving the musculoskeletal system: Secondary | ICD-10-CM | POA: Insufficient documentation

## 2017-10-18 DIAGNOSIS — R296 Repeated falls: Secondary | ICD-10-CM | POA: Diagnosis not present

## 2017-10-18 DIAGNOSIS — G20 Parkinson's disease: Secondary | ICD-10-CM | POA: Insufficient documentation

## 2017-10-18 DIAGNOSIS — Y92009 Unspecified place in unspecified non-institutional (private) residence as the place of occurrence of the external cause: Secondary | ICD-10-CM | POA: Insufficient documentation

## 2017-10-18 DIAGNOSIS — M25551 Pain in right hip: Secondary | ICD-10-CM | POA: Insufficient documentation

## 2017-10-18 DIAGNOSIS — R29818 Other symptoms and signs involving the nervous system: Secondary | ICD-10-CM | POA: Insufficient documentation

## 2017-10-18 DIAGNOSIS — M48062 Spinal stenosis, lumbar region with neurogenic claudication: Secondary | ICD-10-CM | POA: Diagnosis not present

## 2017-10-18 NOTE — Therapy (Signed)
[place] MAIN [facility] SERVICES [address] [place], [place], [address] Phone: [phone]   Fax:  [phone]  Physical Therapy Treatment  Patient Details  Name: [name] MRN: [id number] Date of Birth: 16-Dec-1950 Referring Provider: Wells Guiles Tat   Encounter Date: 10/18/2017  PT End of Session - 10/18/17 1441    Visit Number  44    Number of Visits  49    Date for PT Re-Evaluation  11/09/17    PT Start Time  0626    PT Stop Time  1515    PT Time Calculation (min)  43 min    Equipment Utilized During Treatment  Gait belt    Activity Tolerance  Patient tolerated treatment well    Behavior During Therapy  [facility] for tasks assessed/performed       Past Medical History:  Diagnosis Date  . Arthritis   . Bradycardia   . Cancer [facility]) 2013   skin cancer  . Depression    ptsd  . Dysrhythmia    chronic slow heart rate  . GERD (gastroesophageal reflux disease)   . Headache(784.0)    tension headaches non recent  . History of chicken pox   . History of kidney stones    passed  . Hypertension    treated with HCTZ  . Parkinson's disease ([place])    dx'ed 15 years ago  . PTSD (post-traumatic stress disorder)   . Shortness of breath dyspnea   . Sleep apnea    doesn't use C-pap  . Varicose veins     Past Surgical History:  Procedure Laterality Date  . CHOLECYSTECTOMY N/A 10/22/2014   Procedure: LAPAROSCOPIC CHOLECYSTECTOMY WITH INTRAOPERATIVE CHOLANGIOGRAM;  Surgeon: Dia Crawford III, MD;  Location: [facility] ORS;  Service: General;  Laterality: N/A;  . cyst removed      from lip as a child  . LUMBAR LAMINECTOMY/DECOMPRESSION MICRODISCECTOMY Bilateral 12/14/2012   Procedure: Bilateral lumbar three-four, four-five decompressive laminotomy/foraminotomy;  Surgeon: Charlie Pitter, MD;  Location: [place] NEURO ORS;  Service: Neurosurgery;  Laterality: Bilateral;  . PULSE GENERATOR IMPLANT Bilateral 12/13/2013   Procedure: Bilateral implantable pulse generator  placement;  Surgeon: Erline Levine, MD;  Location: [place] NEURO ORS;  Service: Neurosurgery;  Laterality: Bilateral;  Bilateral implantable pulse generator placement  . skin cancer removed     from ears,   12 lft arm  rt leg 15  . SUBTHALAMIC STIMULATOR BATTERY REPLACEMENT Bilateral 07/14/2017   Procedure: BILATERAL IMPLANTED PULSE GENERATOR CHANGE FOR DEEP BRAIN STIMULATOR;  Surgeon: Erline Levine, MD;  Location: [place];  Service: Neurosurgery;  Laterality: Bilateral;  . SUBTHALAMIC STIMULATOR INSERTION Bilateral 12/06/2013   Procedure: SUBTHALAMIC STIMULATOR INSERTION;  Surgeon: Erline Levine, MD;  Location: [place] NEURO ORS;  Service: Neurosurgery;  Laterality: Bilateral;  Bilateral deep brain stimulator placement    There were no vitals filed for this visit.  Subjective Assessment - 10/18/17 1434    Subjective  After last session, that evening, patient was reaching to get something from the floor and when getting back up slid down and fell onto L side onto metal trash can.  Patient went to doctor today who said it was a hematoma that would go away in a few week-months.     Pertinent History  Patient referred for physical therapy due to wife's request for patient to get aquatic therapy to help with his conditioning. Pt is also complaining of chronic low back and R hip pain and frequent falls. 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 The last time he was in the water he was very uncomfortable because he didn't feel like he had any control. Pt has a history of 2 low back surgeries in August 2014 and February 2016. Pt and wife are unable to recall exactly what was performed but they know that he had stenosis. Surgeries were performed by Dr. Annette Stable in [place]. Pt reports that his low back and R hip pain are constant. He describes the pain in the back as "dull" and the pain in the hip as "sharp." For both back and hip worst pain is 10/10, Best: 2/10, Present:  2/10. No particular episode of trauma to his low back or hip preceding the pain. Pt has a history of Parkinson's with a deep brain stimulator. He ambulates with a 4 wheeled walker called a U-step (automatic brakes, audio cues for stepping). He states that he falls frequently and reports that he has had approximately 40-50 falls in the last 6 months. No specific recent changes in his health.     Currently in Pain?  Yes    Pain Score  9     Pain Location  Back lumbar and buttocks    Pain Orientation  Left    Pain Descriptors / Indicators  Aching    Pain Type  Acute pain    Pain Onset  Yesterday    Pain Frequency  Constant      5lb ankle weight seated  LAQ 2 second raise 2 second drop 15 x each leg 2 sets   Marching 15x each leg ; 2 sets  Adduction squeezes 15x 3 second holds ; 2 sets   GTB hamstring curl 15x each leg, 2 sets   GTB abduction, one leg at a time 15x each leg ; 2 sets   Balloon taps seated for stability while reaching inside and outside BOS   SPT from [facility] to plinth table with use of rollator, CGA no episodes of Posterior LOB with improved stability throughout transfer                          PT Education - 10/18/17 1436    Education provided  Yes    Education Details  exercise technique     Person(s) Educated  Patient    Methods  Explanation;Demonstration;Verbal cues    Comprehension  Verbalized understanding;Returned demonstration       PT Short Term Goals - 07/19/17 1741      PT SHORT TERM GOAL #1   Title  Pt will perform HEP with family's supervision, for improved balance, transfers, and gait.      Baseline  patient reports compliance, however not every day    Time  4    Period  Weeks    Status  Partially Met      PT SHORT TERM GOAL #2   Title  Pt will perform 5x sit<>stand in less than or equal to 15 seconds for improved efficiency and safety with transfers.    Baseline  19 seconds; deffered at this time due to excessive LOB     Time   2    Period  Weeks    Status  On-going        PT Long Term Goals - 09/14/17 1829      PT LONG TERM GOAL #1   Title  Pt will decrease 5TSTS by  at least 3 seconds in order to demonstrate clinically significant improvement in LE strength     Baseline  02/08/17: 20.1 seconds 12/18: 17 seconds ; 09/14/17 =24.34 sec with assist due to poor static standing balance    Time  8    Period  Weeks    Status  Achieved      PT LONG TERM GOAL #2   Title  Pt will decrease TUG by at least 3.4s in order to demonstrate decreased fall risk     Baseline  02/08/17: 22.1s 12/18: 20 seconds 2/7: 19 seconds; 4/3: 23 seconds with walker, 09/14/1913.12 sec    Time  8    Period  Weeks    Status  Partially Met    Target Date  11/09/17      PT LONG TERM GOAL #3   Title  Pt will improve BERG by at least 3 points in order to demonstrate clinically significant improvement in balance.    Baseline  02/08/17: 30/56 12/18: 38/56      Time  8    Period  Weeks    Status  Achieved    Target Date  --      PT LONG TERM GOAL #4   Title  Pt/wife will verbalize understanding of plans for continued community fitness upon D/C from PT including aquatic therapy program    Baseline  02/08/17: pt currently reports being more comfortable in the water    Time  8    Period  Weeks    Status  Partially Met    Target Date  11/09/17      PT LONG TERM GOAL #5   Title  Pt will decrease mODI scoreby at least 13 points in order demonstrate clinically significant reduction in pain/disability     Baseline  02/08/17: 54% 12/18: 46% 2/7: 42% 4/3; 50%, 09/14/17 42%    Time  8    Period  Weeks    Status  Partially Met    Target Date  11/09/17      PT LONG TERM GOAL #6   Title  Pt will improve BERG to 44/56  in order to demonstrate clinically significant improvement in balance for decreased fall risk    Baseline  12/18: 38/56 2/7: 41/56 4/3: 41/56, 09/14/17  19/56    Time  8    Period  Weeks    Status  Partially Met    Target Date   11/09/17            Plan - 10/18/17 1447    Clinical Impression Statement  Patient limited in activity positions due to pain in left lumbar and gluteal region from hematoma from fall. Will reduce strain on positioning this session and re-introduce next session. Patient will continue to benefit from skilled physical therapy to improve static and dynamic standing balance and decrease fall risk for improved functional capacity of activities    Rehab Potential  Fair    PT Frequency  2x / week    PT Duration  8 weeks    PT Treatment/Interventions  ADLs/Self Care Home Management;Aquatic Therapy;Cryotherapy;Electrical Stimulation;Iontophoresis 49m/ml Dexamethasone;Moist Heat;Ultrasound;Contrast Bath;DME Instruction;Gait training;Stair training;Functional mobility training;Therapeutic activities;Therapeutic exercise;Balance training;Neuromuscular re-education;Patient/family education;Manual techniques;Wheelchair mobility training;Energy conservation;Passive range of motion    PT Next Visit Plan  progress balance, strength, aquatic therapy    PT Home Exercise Plan  None provided at this time until further assessed, wife reports pt does not perform [facility]    Consulted and  Agree with Plan of Care  Patient       Patient will benefit from skilled therapeutic intervention in order to improve the following deficits and impairments:  Abnormal gait, Decreased balance, Decreased coordination, Decreased mobility, Impaired tone, Postural dysfunction  Visit Diagnosis: Muscle weakness (generalized)  Other lack of coordination  Other abnormalities of gait and mobility  Repeated falls     Problem List Patient Active Problem List   Diagnosis Date Noted  . Hematoma 10/18/2017  . Accidental fall 10/18/2017  . 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 Radicular pain in right arm 10/09/2016  . Rash 09/03/2016  . GERD (gastroesophageal reflux disease) 07/31/2016  . Trochanteric bursitis  03/03/2016  . Left hand pain 10/30/2015  . Fracture, finger, multiple sites 10/30/2015  . Colon cancer screening 07/23/2015  . Healthcare maintenance 07/23/2015  . Gout 02/18/2015  . Depression 01/06/2015  . Irritation of eyelid 08/20/2014  . Dupuytren's contracture 08/20/2014  . Cough 07/21/2014  . Lumbar stenosis with neurogenic claudication 06/13/2014  . Preop cardiovascular exam 05/28/2014  . S/P deep brain stimulator placement 05/08/2014  . Joint pain 01/22/2014  . Medicare annual wellness visit, initial 01/19/2014  . Advance care planning 01/19/2014  . Parkinson's disease ([place]) 12/13/2013  . PTSD (post-traumatic stress disorder) 06/13/2013  . Erectile dysfunction 06/13/2013  . HLD (hyperlipidemia) 06/13/2013  . Hip pain 06/10/2013  . Pain in joint, shoulder region 06/10/2013  . Right leg swelling 06/03/2013  . Essential hypertension 06/03/2013  . Bradycardia by electrocardiogram 06/03/2013  . Obstructive sleep apnea 03/12/2013  . Spinal stenosis, lumbar region, with neurogenic claudication 12/14/2012   Janna Arch, PT, DPT   10/18/2017, 3:16 PM  [place] MAIN [facility] SERVICES [address] [place], [place], [address] Phone: [phone]   Fax:  [phone]  Name: JADA KUHNERT MRN: [id number] Date of Birth: [birth year]

## 2017-10-18 NOTE — Assessment & Plan Note (Signed)
No proceeding symptoms. No bony or head injury. No LOC.

## 2017-10-18 NOTE — Assessment & Plan Note (Signed)
No red flags, no active bleeding. No bony injury.  Reviewed length of time it will take to resolve and possible complicaitons. Can use naprosyn to treat pain if needed  as no active bleeding.   Not on blood thinner.

## 2017-10-18 NOTE — Patient Instructions (Addendum)
Hematoma.  Call if increasing pain or redness.  Will take several weeks to months to reabsorb.

## 2017-10-18 NOTE — Progress Notes (Signed)
   Subjective:    Patient ID: [name], male    DOB: Dec 13, 1950, 67 y.o.   MRN: [id number]  HPI     67 year old male pt of Dr. Josefine Class  with history of parkinson's chronic back pain due to spinal stenosis presents with new onset back pain following fall on 10/12/2016.   Was on hands and knees and  Fell laterally into mesh trash can .Marland Kitchen Hit left lateral chest wall and low back and crushed the can.  Accidental fall... No plap, no CP, no dizziness.  No LOC, no head injury.   Mentioned left chest wall pain and left lower back pain to wife initially but no bruise.Marland Kitchen 3-4 days after wife noted lump on left lower back.  Not getting bigger now but not going away.. Size of marshmallow egg.  Fairly painful.. Mainly with pressure  No SOB, able to take deep breaths.  Blood pressure 102/70, pulse (!) 50, temperature 97.9 F (36.6 C), temperature source Oral, height 5\' 8"  (1.727 m), weight 185 lb (83.9 kg).  Social History /Family History/Past Medical History reviewed in detail and updated in EMR if needed.  Review of Systems  Constitutional: Negative for fever.  HENT: Negative for ear pain.   Eyes: Negative for pain.  Respiratory: Negative for cough.   Cardiovascular: Negative for leg swelling.  Neurological: Positive for light-headedness. Negative for weakness and numbness.       Objective:   Physical Exam  Constitutional: Vital signs are normal. He appears well-developed and well-nourished.  In wheelchair  HENT:  Head: Normocephalic.  Right Ear: Hearing normal.  Left Ear: Hearing normal.  Nose: Nose normal.  Mouth/Throat: Oropharynx is clear and moist and mucous membranes are normal.  Neck: Trachea normal. Carotid bruit is not present. No thyroid mass and no thyromegaly present.  Cardiovascular: Normal rate, regular rhythm and normal pulses. Exam reveals no gallop, no distant heart sounds and no friction rub.  No murmur heard. No peripheral edema  Pulmonary/Chest: Effort normal  and breath sounds normal. No respiratory distress.  Musculoskeletal:       Lumbar back: He exhibits normal range of motion, no tenderness and no bony tenderness.       Back:   Circle marks location of hematoma  Neurological: Coordination abnormal.  Skin: Skin is warm, dry and intact. No rash noted.  Contusion in left lateral  Lower abdomen and , egg size hematoma in left lower back, mild ttp  Psychiatric: He has a normal mood and affect. His speech is normal and behavior is normal. Thought content normal.          Assessment & Plan:

## 2017-10-18 NOTE — Therapy (Signed)
[place] MAIN [facility] SERVICES [address] [place], [place], [address] Phone: [phone]   Fax:  [phone]  Occupational Therapy Treatment  Patient Details  Name: CHANOCH MCCLEERY MRN: [id number] Date of Birth: 1950-10-22 Referring Provider: Wells Guiles Tat   Encounter Date: 10/18/2017  OT End of Session - 10/18/17 1355    Visit Number  16    Number of Visits  24    Date for OT Re-Evaluation  10/23/17    Authorization Type  Visit 5 of 10 for progress reporting period starting 07/31/2017    OT Start Time  1345    OT Stop Time  1430    OT Time Calculation (min)  45 min    Activity Tolerance  Patient tolerated treatment well    Behavior During Therapy  [facility] for tasks assessed/performed       Past Medical History:  Diagnosis Date  . Arthritis   . Bradycardia   . Cancer [facility]) 2013   skin cancer  . Depression    ptsd  . Dysrhythmia    chronic slow heart rate  . GERD (gastroesophageal reflux disease)   . Headache(784.0)    tension headaches non recent  . History of chicken pox   . History of kidney stones    passed  . Hypertension    treated with HCTZ  . Parkinson's disease ([place])    dx'ed 15 years ago  . PTSD (post-traumatic stress disorder)   . Shortness of breath dyspnea   . Sleep apnea    doesn't use C-pap  . Varicose veins     Past Surgical History:  Procedure Laterality Date  . CHOLECYSTECTOMY N/A 10/22/2014   Procedure: LAPAROSCOPIC CHOLECYSTECTOMY WITH INTRAOPERATIVE CHOLANGIOGRAM;  Surgeon: Dia Crawford III, MD;  Location: [facility] ORS;  Service: General;  Laterality: N/A;  . cyst removed      from lip as a child  . LUMBAR LAMINECTOMY/DECOMPRESSION MICRODISCECTOMY Bilateral 12/14/2012   Procedure: Bilateral lumbar three-four, four-five decompressive laminotomy/foraminotomy;  Surgeon: Charlie Pitter, MD;  Location: [place] NEURO ORS;  Service: Neurosurgery;  Laterality: Bilateral;  . PULSE GENERATOR IMPLANT Bilateral 12/13/2013    Procedure: Bilateral implantable pulse generator placement;  Surgeon: Erline Levine, MD;  Location: [place] NEURO ORS;  Service: Neurosurgery;  Laterality: Bilateral;  Bilateral implantable pulse generator placement  . skin cancer removed     from ears,   12 lft arm  rt leg 15  . SUBTHALAMIC STIMULATOR BATTERY REPLACEMENT Bilateral 07/14/2017   Procedure: BILATERAL IMPLANTED PULSE GENERATOR CHANGE FOR DEEP BRAIN STIMULATOR;  Surgeon: Erline Levine, MD;  Location: [place];  Service: Neurosurgery;  Laterality: Bilateral;  . SUBTHALAMIC STIMULATOR INSERTION Bilateral 12/06/2013   Procedure: SUBTHALAMIC STIMULATOR INSERTION;  Surgeon: Erline Levine, MD;  Location: [place] NEURO ORS;  Service: Neurosurgery;  Laterality: Bilateral;  Bilateral deep brain stimulator placement    There were no vitals filed for this visit.  Subjective Assessment - 10/18/17 1353    Subjective   Pt. reports he is doing well today.    Patient is accompained by:  Family member    Pertinent History  Pt. is a 67 y.o. mlae who has a history of Parkinson's Disease. Pt. has a Deep Brain Stimulator in place. Pt. has a history of Low Back Pain with 2 back surgeries in August 2014, and February 2016. Pt. has a history of multiple frequent falls occurring at a rate of 6x's a week.     Limitations  Frequent falls, limited motor  control, and [place].     Patient Stated Goals  To return home.    Pain Score  9     Pain Location  Back    Pain Orientation  Left    Pain Descriptors / Indicators  Aching    Pain Type  Acute pain       OT TREATMENT    Neuro muscular re-education:  Pt. worked manipulating, and handling needle nosed tweezers to pick up 1/8" small pegs. Pt. worked on Visual merchandiser, and coordination skills needed to control the tweezers, and place the small pegs onto a pegboard. Pt. required verbal cues to keep right elbow in a low position. Pt. worked on bilateral [facility] skills needed to grasp small resistive beads. Pt. worked on connecting the  beads using a 3pt. pinch, and pt. Pinch grasp. Pt. worked on disconnecting the resistive beads using a lateral pinch grasp, and 3pt. pinch grasp.                          OT Education - 10/18/17 1355    Education provided  Yes    Education Details  [facility]    Person(s) Educated  Patient    Methods  Demonstration;Verbal cues;Explanation    Comprehension  Verbal cues required;Verbalized understanding          OT Long Term Goals - 09/14/17 0954      OT LONG TERM GOAL #1   Title  Pt. will improve Bilateral UE strength by 1 mm grade to assist with ADLs, and IADLs    Baseline  EVal: BUE strength: 4+/5 overall    Time  12    Period  Weeks    Status  On-going    Target Date  10/23/17      OT LONG TERM GOAL #2   Title  Pt. will improve grip strength to be able to pour himself a beverage.    Baseline  Eval: Pt. is unable to hold and pour a beevrage.    Time  12    Period  Weeks    Status  On-going    Target Date  10/23/17      OT LONG TERM GOAL #3   Title  Pt. will improve lateral pinch stength to be able to independently turn a key.     Baseline  Eval: Pt. has difficulty holding, and turning  key.    Time  12    Period  Weeks    Status  On-going    Target Date  10/23/17      OT LONG TERM GOAL #4   Title  Pt. will improve Right hand [facility] skills to be able to independently grasp and use small objects needed for fixing car parts.    Baseline  Eval: Right hand 1 min. & 5 sec. on the 9 hole peg test.    Time  12    Period  Weeks    Status  On-going    Target Date  10/23/17      OT LONG TERM GOAL #5   Title  Pt. will be demonstarte self-feeding with modified independence with minimal spillage.    Baseline  Eval: Pt. requires assistance, and has alot of spillage.    Time  12    Period  Weeks    Status  On-going    Target Date  10/23/17      Long Term Additional Goals   Additional Long Term Goals  Yes      OT LONG TERM GOAL #6   Title  Pt. will manipulate  buttons, and cuff buttons independently.    Baseline  Eval: Pt. has difficulty     Time  12    Period  Weeks    Status  On-going    Target Date  10/23/17      OT LONG TERM GOAL #7   Title  Pt. will write 1 sentence efficiently with 90% legibility.    Baseline  Eval: name only 50% legibility    Time  12    Period  Weeks    Status  On-going    Target Date  10/23/17      OT LONG TERM GOAL #8   Title  Pt. will improve typing speed, and accuracy to be able to to type emails efficently    Baseline  09/14/2017: Typing speed: 3 wpm, Accuracy: 41% after 3 attempts with moderate cues for hand placement    Time  12    Period  Weeks    Status  New    Target Date  12/07/17            Plan - 10/18/17 1356    Clinical Impression Statement Pt. reports having had a fall last week. Pt. reports it is the only fall that he remembers having last week. Pt. reports that he hurt his left side, and back in the fall. Pt. has substantial bruising from the fall. Pt. continues to work on improving [facility] skills for improved engagement in ADL, and IADL tasks..    Occupational performance deficits (Please refer to evaluation for details):  ADL's;IADL's    Rehab Potential  Good    Current Impairments/barriers affecting progress:  Positive Indicators: age, familiy support, motivation. Negative Indicators: Multiple comorbidities, and history of falls.    OT Frequency  2x / week    OT Duration  12 weeks    OT Treatment/Interventions  Self-care/ADL training;Therapeutic exercise;DME and/or AE instruction;Energy conservation;Neuromuscular education;Patient/family education;Therapeutic activities    Clinical Decision Making  Several treatment options, min-mod task modification necessary    OT Home Exercise Plan  HEP for using rubber bands for strengthening at home and work on more handwriting.    Consulted and Agree with Plan of Care  Patient       Patient will benefit from skilled therapeutic intervention in order  to improve the following deficits and impairments:  Pain, Decreased strength, Decreased coordination, Decreased balance, Impaired UE functional use, Decreased safety awareness, Decreased activity tolerance, Decreased cognition, Impaired flexibility, Impaired vision/preception  Visit Diagnosis: Muscle weakness (generalized)  Other lack of coordination    Problem List Patient Active Problem List   Diagnosis Date Noted  . Hematoma 10/18/2017  . Accidental fall 10/18/2017  . 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 Radicular pain in right arm 10/09/2016  . Rash 09/03/2016  . GERD (gastroesophageal reflux disease) 07/31/2016  . Trochanteric bursitis 03/03/2016  . Left hand pain 10/30/2015  . Fracture, finger, multiple sites 10/30/2015  . Colon cancer screening 07/23/2015  . Healthcare maintenance 07/23/2015  . Gout 02/18/2015  . Depression 01/06/2015  . Irritation of eyelid 08/20/2014  . Dupuytren's contracture 08/20/2014  . Cough 07/21/2014  . Lumbar stenosis with neurogenic claudication 06/13/2014  . Preop cardiovascular exam 05/28/2014  . S/P deep brain stimulator placement 05/08/2014  . Joint pain 01/22/2014  . Medicare annual wellness visit, initial 01/19/2014  . Advance care planning 01/19/2014  . Parkinson's disease ([place]) 12/13/2013  .  PTSD (post-traumatic stress disorder) 06/13/2013  . Erectile dysfunction 06/13/2013  . HLD (hyperlipidemia) 06/13/2013  . Hip pain 06/10/2013  . Pain in joint, shoulder region 06/10/2013  . Right leg swelling 06/03/2013  . Essential hypertension 06/03/2013  . Bradycardia by electrocardiogram 06/03/2013  . Obstructive sleep apnea 03/12/2013  . Spinal stenosis, lumbar region, with neurogenic claudication 12/14/2012    Harrel Carina, MS, OTR/L 10/18/2017, 2:12 PM  [place] MAIN [facility] SERVICES [address] [place], [place], [address] Phone: [phone]   Fax:  [phone]  Name: RONDEY FALLEN MRN: [id number] Date of Birth: 1950/11/20

## 2017-10-22 ENCOUNTER — Telehealth: Payer: Self-pay | Admitting: Family Medicine

## 2017-10-22 NOTE — Telephone Encounter (Signed)
Letter done for patient.  Please make sure this printed and is sent to patient.  Thanks.

## 2017-10-23 NOTE — Telephone Encounter (Signed)
Wife (caregiver) was in for [facility] and was given the letter in person by Dr. Damita Dunnings.

## 2017-10-24 ENCOUNTER — Ambulatory Visit: Payer: Medicare PPO

## 2017-10-24 ENCOUNTER — Ambulatory Visit: Payer: Medicare PPO | Admitting: Occupational Therapy

## 2017-10-24 ENCOUNTER — Encounter: Payer: Self-pay | Admitting: Occupational Therapy

## 2017-10-24 DIAGNOSIS — R2689 Other abnormalities of gait and mobility: Secondary | ICD-10-CM

## 2017-10-24 DIAGNOSIS — R296 Repeated falls: Secondary | ICD-10-CM | POA: Diagnosis not present

## 2017-10-24 DIAGNOSIS — R278 Other lack of coordination: Secondary | ICD-10-CM | POA: Diagnosis not present

## 2017-10-24 DIAGNOSIS — M6281 Muscle weakness (generalized): Secondary | ICD-10-CM | POA: Diagnosis not present

## 2017-10-24 DIAGNOSIS — R29898 Other symptoms and signs involving the musculoskeletal system: Secondary | ICD-10-CM | POA: Diagnosis not present

## 2017-10-24 DIAGNOSIS — R293 Abnormal posture: Secondary | ICD-10-CM | POA: Diagnosis not present

## 2017-10-24 DIAGNOSIS — G20 Parkinson's disease: Secondary | ICD-10-CM | POA: Diagnosis not present

## 2017-10-24 DIAGNOSIS — R2681 Unsteadiness on feet: Secondary | ICD-10-CM | POA: Diagnosis not present

## 2017-10-24 DIAGNOSIS — R279 Unspecified lack of coordination: Secondary | ICD-10-CM | POA: Diagnosis not present

## 2017-10-24 NOTE — Therapy (Signed)
[place] MAIN [facility] SERVICES [address] [place], [place], [address] Phone: [phone]   Fax:  [phone]  Physical Therapy Treatment  Patient Details  Name: George CUDWORTH MRN: [id number] Date of Birth: 1951-03-01 Referring Provider: Wells Guiles Tat   Encounter Date: 10/24/2017  PT End of Session - 10/24/17 0937    Visit Number  45    Number of Visits  49    Date for PT Re-Evaluation  11/09/17    PT Start Time  0932    PT Stop Time  1015    PT Time Calculation (min)  43 min    Equipment Utilized During Treatment  Gait belt    Activity Tolerance  Patient tolerated treatment well    Behavior During Therapy  [facility] for tasks assessed/performed       Past Medical History:  Diagnosis Date  . Arthritis   . Bradycardia   . Cancer [facility]) 2013   skin cancer  . Depression    ptsd  . Dysrhythmia    chronic slow heart rate  . GERD (gastroesophageal reflux disease)   . Headache(784.0)    tension headaches non recent  . History of chicken pox   . History of kidney stones    passed  . Hypertension    treated with HCTZ  . Parkinson's disease ([place])    dx'ed 15 years ago  . PTSD (post-traumatic stress disorder)   . Shortness of breath dyspnea   . Sleep apnea    doesn't use C-pap  . Varicose veins     Past Surgical History:  Procedure Laterality Date  . CHOLECYSTECTOMY N/A 10/22/2014   Procedure: LAPAROSCOPIC CHOLECYSTECTOMY WITH INTRAOPERATIVE CHOLANGIOGRAM;  Surgeon: Dia Crawford III, MD;  Location: [facility] ORS;  Service: General;  Laterality: N/A;  . cyst removed      from lip as a child  . LUMBAR LAMINECTOMY/DECOMPRESSION MICRODISCECTOMY Bilateral 12/14/2012   Procedure: Bilateral lumbar three-four, four-five decompressive laminotomy/foraminotomy;  Surgeon: Charlie Pitter, MD;  Location: [place] NEURO ORS;  Service: Neurosurgery;  Laterality: Bilateral;  . PULSE GENERATOR IMPLANT Bilateral 12/13/2013   Procedure: Bilateral implantable pulse generator  placement;  Surgeon: Erline Levine, MD;  Location: [place] NEURO ORS;  Service: Neurosurgery;  Laterality: Bilateral;  Bilateral implantable pulse generator placement  . skin cancer removed     from ears,   12 lft arm  rt leg 15  . SUBTHALAMIC STIMULATOR BATTERY REPLACEMENT Bilateral 07/14/2017   Procedure: BILATERAL IMPLANTED PULSE GENERATOR CHANGE FOR DEEP BRAIN STIMULATOR;  Surgeon: Erline Levine, MD;  Location: [place];  Service: Neurosurgery;  Laterality: Bilateral;  . SUBTHALAMIC STIMULATOR INSERTION Bilateral 12/06/2013   Procedure: SUBTHALAMIC STIMULATOR INSERTION;  Surgeon: Erline Levine, MD;  Location: [place] NEURO ORS;  Service: Neurosurgery;  Laterality: Bilateral;  Bilateral deep brain stimulator placement    There were no vitals filed for this visit.  Subjective Assessment - 10/24/17 0935    Subjective  Patient fell on Sunday after a chaotic weekend due to mother in law going to the hospital. Has a gash on L arm from fall.     Pertinent History  Patient referred for physical therapy due to wife's request for patient to get aquatic therapy to help with his conditioning. Pt is also complaining of chronic low back and R hip pain and frequent falls. 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 The last time he was in the water he  was very uncomfortable because he didn't feel like he had any control. Pt has a history of 2 low back surgeries in August 2014 and February 2016. Pt and wife are unable to recall exactly what was performed but they know that he had stenosis. Surgeries were performed by Dr. Annette Stable in [place]. Pt reports that his low back and R hip pain are constant. He describes the pain in the back as "dull" and the pain in the hip as "sharp." For both back and hip worst pain is 10/10, Best: 2/10, Present: 2/10. No particular episode of trauma to his low back or hip preceding the pain. Pt has a history of Parkinson's with a deep brain  stimulator. He ambulates with a 4 wheeled walker called a U-step (automatic brakes, audio cues for stepping). He states that he falls frequently and reports that he has had approximately 40-50 falls in the last 6 months. No specific recent changes in his health.     Currently in Pain?  Yes    Pain Score  8     Pain Location  Arm forearm gash    Pain Orientation  Left    Pain Descriptors / Indicators  Aching    Pain Type  Acute pain    Pain Onset  Yesterday    Pain Frequency  Constant       -Nustep L4,46mnutesfor warm-up (unbilled); LE only    -Standingmarching5# ankle weight x 15; 2 sets  bilateral, BUE support; Frequent forward momentum requiring cueing for upright posture and to bring legs in line with trunk and lift head up.   -Standingabduction 5# ankle weight x 10 bilateral, BUE support;  -Standinghamstring curl5# ankle weight x 15 bilateral, BUE support;  -Standing hip extension 5# ankle weight x 10 bilateral, BUE support; 2 sets; cues for keeping leg straight and trunk upright.    -Standing hip flexion with straight leg 5# ankle weight x 10 bilateral, BUE support  -seated LAQ  5lb ankle weights 10x each leg 3 second holds   -seated adduction squeezes20x   -seated marches 5lb ankle weights 10x ; cues for upright posture    Neuromuscular Re-education  6" step step-ups alternating LE x 12 each, verbal cues for L and R for pace.  -Airex pad balance with feet apart eyes open5x 30s,  -speed ladder: one foot in each box to improve smoothness/coordination of ambulation and step length. SUE support 6x length of bars    Pt. response to medical necessity: Patient will continue to benefit from skilled physical therapy to improve static and dynamic standing balance and decrease fall risk for improved functional capacity of activities                         PT Education - 10/24/17 0936    Education provided  Yes     Education Details  exercise technique     Person(s) Educated  Patient    Methods  Explanation;Demonstration;Verbal cues    Comprehension  Verbalized understanding;Returned demonstration       PT Short Term Goals - 07/19/17 1741      PT SHORT TERM GOAL #1   Title  Pt will perform HEP with family's supervision, for improved balance, transfers, and gait.      Baseline  patient reports compliance, however not every day    Time  4    Period  Weeks    Status  Partially Met      PT  SHORT TERM GOAL #2   Title  Pt will perform 5x sit<>stand in less than or equal to 15 seconds for improved efficiency and safety with transfers.    Baseline  19 seconds; deffered at this time due to excessive LOB     Time  2    Period  Weeks    Status  On-going        PT Long Term Goals - 09/14/17 2595      PT LONG TERM GOAL #1   Title  Pt will decrease 5TSTS by at least 3 seconds in order to demonstrate clinically significant improvement in LE strength     Baseline  02/08/17: 20.1 seconds 12/18: 17 seconds ; 09/14/17 =24.34 sec with assist due to poor static standing balance    Time  8    Period  Weeks    Status  Achieved      PT LONG TERM GOAL #2   Title  Pt will decrease TUG by at least 3.4s in order to demonstrate decreased fall risk     Baseline  02/08/17: 22.1s 12/18: 20 seconds 2/7: 19 seconds; 4/3: 23 seconds with walker, 09/14/1913.12 sec    Time  8    Period  Weeks    Status  Partially Met    Target Date  11/09/17      PT LONG TERM GOAL #3   Title  Pt will improve BERG by at least 3 points in order to demonstrate clinically significant improvement in balance.    Baseline  02/08/17: 30/56 12/18: 38/56      Time  8    Period  Weeks    Status  Achieved    Target Date  --      PT LONG TERM GOAL #4   Title  Pt/wife will verbalize understanding of plans for continued community fitness upon D/C from PT including aquatic therapy program    Baseline  02/08/17: pt currently reports being more  comfortable in the water    Time  8    Period  Weeks    Status  Partially Met    Target Date  11/09/17      PT LONG TERM GOAL #5   Title  Pt will decrease mODI scoreby at least 13 points in order demonstrate clinically significant reduction in pain/disability     Baseline  02/08/17: 54% 12/18: 46% 2/7: 42% 4/3; 50%, 09/14/17 42%    Time  8    Period  Weeks    Status  Partially Met    Target Date  11/09/17      PT LONG TERM GOAL #6   Title  Pt will improve BERG to 44/56  in order to demonstrate clinically significant improvement in balance for decreased fall risk    Baseline  12/18: 38/56 2/7: 41/56 4/3: 41/56, 09/14/17  19/56    Time  8    Period  Weeks    Status  Partially Met    Target Date  11/09/17            Plan - 10/24/17 0954    Clinical Impression Statement  Patient demonstrates frequent posterior LOB throughout session requiring PT to assist patient in regaining COM. Decreased coordination and control of LE noted in standing interventions compared to previous sessions. Patient will continue to benefit from skilled physical therapy to improve static and dynamic standing balance and decrease fall risk for improved functional capacity of activities    Rehab Potential  Fair  PT Frequency  2x / week    PT Duration  8 weeks    PT Treatment/Interventions  ADLs/Self Care Home Management;Aquatic Therapy;Cryotherapy;Electrical Stimulation;Iontophoresis '4mg'$ /ml Dexamethasone;Moist Heat;Ultrasound;Contrast Bath;DME Instruction;Gait training;Stair training;Functional mobility training;Therapeutic activities;Therapeutic exercise;Balance training;Neuromuscular re-education;Patient/family education;Manual techniques;Wheelchair mobility training;Energy conservation;Passive range of motion    PT Next Visit Plan  progress balance, strength, aquatic therapy    PT Home Exercise Plan  None provided at this time until further assessed, wife reports pt does not perform current HEP from [facility] PT     Consulted and Agree with Plan of Care  Patient       Patient will benefit from skilled therapeutic intervention in order to improve the following deficits and impairments:  Abnormal gait, Decreased balance, Decreased coordination, Decreased mobility, Impaired tone, Postural dysfunction  Visit Diagnosis: Muscle weakness (generalized)  Other lack of coordination  Other abnormalities of gait and mobility  Repeated falls     Problem List Patient Active Problem List   Diagnosis Date Noted  . Hematoma 10/18/2017  . Accidental fall 10/18/2017  . 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 Radicular pain in right arm 10/09/2016  . Rash 09/03/2016  . GERD (gastroesophageal reflux disease) 07/31/2016  . Trochanteric bursitis 03/03/2016  . Left hand pain 10/30/2015  . Fracture, finger, multiple sites 10/30/2015  . Colon cancer screening 07/23/2015  . Healthcare maintenance 07/23/2015  . Gout 02/18/2015  . Depression 01/06/2015  . Irritation of eyelid 08/20/2014  . Dupuytren's contracture 08/20/2014  . Cough 07/21/2014  . Lumbar stenosis with neurogenic claudication 06/13/2014  . Preop cardiovascular exam 05/28/2014  . S/P deep brain stimulator placement 05/08/2014  . Joint pain 01/22/2014  . Medicare annual wellness visit, initial 01/19/2014  . Advance care planning 01/19/2014  . Parkinson's disease ([place]) 12/13/2013  . PTSD (post-traumatic stress disorder) 06/13/2013  . Erectile dysfunction 06/13/2013  . HLD (hyperlipidemia) 06/13/2013  . Hip pain 06/10/2013  . Pain in joint, shoulder region 06/10/2013  . Right leg swelling 06/03/2013  . Essential hypertension 06/03/2013  . Bradycardia by electrocardiogram 06/03/2013  . Obstructive sleep apnea 03/12/2013  . Spinal stenosis, lumbar region, with neurogenic claudication 12/14/2012   Janna Arch, PT, DPT   10/24/2017, 10:15 AM  [place] MAIN [facility] SERVICES [place],  [place], [address] Phone: [phone]   Fax:  [phone]  Name: [name] MRN: [id number] Date of Birth: 1951-03-12

## 2017-10-24 NOTE — Therapy (Signed)
[place] MAIN [facility] SERVICES [address] [place], [place], [address] Phone: [phone]   Fax:  [phone]  Occupational Therapy Treatment/Recertification Note  Patient Details  Name: [name] MRN: [id number] Date of Birth: 09/07/1950 Referring Provider: Wells Guiles Tat   Encounter Date: 10/24/2017  OT End of Session - 10/24/17 0844    Visit Number  17    Number of Visits  24    Date for OT Re-Evaluation  01/16/18    Authorization Type  Visit 6 of 10 for progress reporting period starting 07/31/2017    OT Start Time  0835    OT Stop Time  0920    OT Time Calculation (min)  45 min    Activity Tolerance  Patient tolerated treatment well    Behavior During Therapy  [facility] for tasks assessed/performed       Past Medical History:  Diagnosis Date  . Arthritis   . Bradycardia   . Cancer [facility]) 2013   skin cancer  . Depression    ptsd  . Dysrhythmia    chronic slow heart rate  . GERD (gastroesophageal reflux disease)   . Headache(784.0)    tension headaches non recent  . History of chicken pox   . History of kidney stones    passed  . Hypertension    treated with HCTZ  . Parkinson's disease ([place])    dx'ed 15 years ago  . PTSD (post-traumatic stress disorder)   . Shortness of breath dyspnea   . Sleep apnea    doesn't use C-pap  . Varicose veins     Past Surgical History:  Procedure Laterality Date  . CHOLECYSTECTOMY N/A 10/22/2014   Procedure: LAPAROSCOPIC CHOLECYSTECTOMY WITH INTRAOPERATIVE CHOLANGIOGRAM;  Surgeon: Dia Crawford III, MD;  Location: [facility] ORS;  Service: General;  Laterality: N/A;  . cyst removed      from lip as a child  . LUMBAR LAMINECTOMY/DECOMPRESSION MICRODISCECTOMY Bilateral 12/14/2012   Procedure: Bilateral lumbar three-four, four-five decompressive laminotomy/foraminotomy;  Surgeon: Charlie Pitter, MD;  Location: [place] NEURO ORS;  Service: Neurosurgery;  Laterality: Bilateral;  . PULSE GENERATOR IMPLANT Bilateral  12/13/2013   Procedure: Bilateral implantable pulse generator placement;  Surgeon: Erline Levine, MD;  Location: [place] NEURO ORS;  Service: Neurosurgery;  Laterality: Bilateral;  Bilateral implantable pulse generator placement  . skin cancer removed     from ears,   12 lft arm  rt leg 15  . SUBTHALAMIC STIMULATOR BATTERY REPLACEMENT Bilateral 07/14/2017   Procedure: BILATERAL IMPLANTED PULSE GENERATOR CHANGE FOR DEEP BRAIN STIMULATOR;  Surgeon: Erline Levine, MD;  Location: [place];  Service: Neurosurgery;  Laterality: Bilateral;  . SUBTHALAMIC STIMULATOR INSERTION Bilateral 12/06/2013   Procedure: SUBTHALAMIC STIMULATOR INSERTION;  Surgeon: Erline Levine, MD;  Location: [place] NEURO ORS;  Service: Neurosurgery;  Laterality: Bilateral;  Bilateral deep brain stimulator placement    There were no vitals filed for this visit.  Subjective Assessment - 10/24/17 0841    Subjective   Pt. reports he is doing well today.    Patient is accompained by:  Family member    Pertinent History  Pt. is a 67 y.o. mlae who has a history of Parkinson's Disease. Pt. has a Deep Brain Stimulator in place. Pt. has a history of Low Back Pain with 2 back surgeries in August 2014, and February 2016. Pt. has a history of multiple frequent falls occurring at a rate of 6x's a week.     Limitations  Frequent falls, limited  motor control, and [place].     Patient Stated Goals  To return home.    Pain Score  8     Pain Location  Arm forearm following another fall.    Pain Orientation  Left    Pain Descriptors / Indicators  Aching    Pain Type  Acute pain    Pain Onset  In the past 7 days       OT TREATMENT    Neuromuscular re-education:  Pt. worked on [facility] skills grasping, and manipulating long nosed tweezers to grasp 1/8" beads. Pt. worked on refining coordination skills to place them on a pegboard.  Selfcare:  Pt. worked on typing at a laptop. 5 min.Typing task: 1st trial: 5 wpm, 28% accuracy 132 characters in 5 min. 2nd trial: 6  wpm, 33% accuracy, 159 characters in 5 min. Pt. Worked on Media planner. Pt. Continues to write small initially with limited legibility.       [facility] OT Assessment - 10/24/17 0001      Coordination   Right 9 Hole Peg Test  45 sec.    Left 9 Hole Peg Test  37 sec.      Hand Function   Right Hand Grip (lbs)  72    Right Hand Lateral Pinch  22 lbs    Right Hand 3 Point Pinch  16 lbs    Left Hand Grip (lbs)  66    Left Hand Lateral Pinch  21 lbs    Left 3 point pinch  20 lbs                       OT Education - 10/24/17 0844    Education provided  Yes    Education Details  [facility]    Person(s) Educated  Patient    Methods  Demonstration;Verbal cues;Explanation    Comprehension  Verbal cues required;Verbalized understanding          OT Long Term Goals - 10/24/17 8841      OT LONG TERM GOAL #1   Title  Pt. will improve Bilateral UE strength by 1 mm grade to assist with ADLs, and IADLs    Baseline  10/24/2017: BUE strength: 4+/5 overall    Time  12    Period  Weeks    Status  On-going    Target Date  01/16/18      OT LONG TERM GOAL #2   Title  Pt. will improve grip strength to be able to pour himself a beverage.    Baseline  10/24/2017: Pt. continues to be unable to hold and pour a beevrage.    Time  12    Period  Weeks    Status  On-going    Target Date  01/16/18      OT LONG TERM GOAL #3   Title  Pt. will improve lateral pinch stength to be able to independently turn a key.     Baseline  10/24/2017: Pt. continues to have difficulty holding, and turning  key.    Time  12    Period  Weeks    Status  On-going    Target Date  01/16/18      OT LONG TERM GOAL #4   Title  Pt. will improve Right hand [facility] skills to be able to independently grasp and use small objects needed for fixing car parts.    Baseline  01/16/2018: 45sec. on the 9 hole peg test.  Time  12    Period  Weeks    Status  On-going    Target Date  01/16/18      OT LONG TERM GOAL #5    Title  Pt. will be demonstarte self-feeding with modified independence with minimal spillage.    Baseline  01/16/2018: Pt. continues to require assistance, and has alot of spillage.    Time  12    Period  Weeks    Status  On-going    Target Date  01/16/18      OT LONG TERM GOAL #6   Title  Pt. will manipulate buttons, and cuff buttons independently.    Baseline  10/24/2017: Pt. has difficulty     Time  12    Period  Weeks    Status  On-going    Target Date  01/16/18      OT LONG TERM GOAL #7   Title  Pt. will write 1 sentence efficiently with 90% legibility.    Baseline  10/24/2017: name only 75% legibility    Time  12    Period  Weeks    Status  On-going    Target Date  01/16/18      OT LONG TERM GOAL #8   Title  Pt. will improve typing speed, and accuracy to be able to to type emails efficently    Baseline  10/24/2017: Typing speed: 6 wpm, Accuracy: 33% with moderate cues for hand placement    Time  12    Period  Weeks    Status  On-going    Target Date  01/16/18            Plan - 10/24/17 0845    Clinical Impression Statement  Pt. reports hurting his left forearm sustained from a fall this past Saturday. Pt. continues to work on improving UE strength, and [facility] skills in order to improve typing, and writing skills. Goals have been reviewed with the pt.   Occupational performance deficits (Please refer to evaluation for details):  ADL's;IADL's    Rehab Potential  Good    Current Impairments/barriers affecting progress:  Positive Indicators: age, familiy support, motivation. Negative Indicators: Multiple comorbidities, and history of falls.    OT Frequency  2x / week    OT Duration  12 weeks    OT Treatment/Interventions  Self-care/ADL training;Therapeutic exercise;DME and/or AE instruction;Energy conservation;Neuromuscular education;Patient/family education;Therapeutic activities    Clinical Decision Making  Several treatment options, min-mod task modification necessary     Consulted and Agree with Plan of Care  Patient       Patient will benefit from skilled therapeutic intervention in order to improve the following deficits and impairments:  Pain, Decreased strength, Decreased coordination, Decreased balance, Impaired UE functional use, Decreased safety awareness, Decreased activity tolerance, Decreased cognition, Impaired flexibility, Impaired vision/preception  Visit Diagnosis: Muscle weakness (generalized) - Plan: Ot plan of care cert/re-cert  Other lack of coordination - Plan: Ot plan of care cert/re-cert    Problem List Patient Active Problem List   Diagnosis Date Noted  . Hematoma 10/18/2017  . Accidental fall 10/18/2017  . 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 Radicular pain in right arm 10/09/2016  . Rash 09/03/2016  . GERD (gastroesophageal reflux disease) 07/31/2016  . Trochanteric bursitis 03/03/2016  . Left hand pain 10/30/2015  . Fracture, finger, multiple sites 10/30/2015  . Colon cancer screening 07/23/2015  . Healthcare maintenance 07/23/2015  . Gout 02/18/2015  . Depression 01/06/2015  . Irritation of eyelid  08/20/2014  . Dupuytren's contracture 08/20/2014  . Cough 07/21/2014  . Lumbar stenosis with neurogenic claudication 06/13/2014  . Preop cardiovascular exam 05/28/2014  . S/P deep brain stimulator placement 05/08/2014  . Joint pain 01/22/2014  . Medicare annual wellness visit, initial 01/19/2014  . Advance care planning 01/19/2014  . Parkinson's disease ([place]) 12/13/2013  . PTSD (post-traumatic stress disorder) 06/13/2013  . Erectile dysfunction 06/13/2013  . HLD (hyperlipidemia) 06/13/2013  . Hip pain 06/10/2013  . Pain in joint, shoulder region 06/10/2013  . Right leg swelling 06/03/2013  . Essential hypertension 06/03/2013  . Bradycardia by electrocardiogram 06/03/2013  . Obstructive sleep apnea 03/12/2013  . Spinal stenosis, lumbar region, with neurogenic claudication 12/14/2012    Harrel Carina, MS,  OTR/L 10/24/2017, 9:42 AM  [place] MAIN [facility] SERVICES [place], [place], [address] Phone: [phone]   Fax:  [phone]  Name: [name] MRN: [id number] Date of Birth: Feb 11, 1951

## 2017-10-26 ENCOUNTER — Other Ambulatory Visit: Payer: Self-pay

## 2017-10-26 ENCOUNTER — Ambulatory Visit: Payer: Medicare PPO

## 2017-10-26 DIAGNOSIS — R296 Repeated falls: Secondary | ICD-10-CM | POA: Diagnosis not present

## 2017-10-26 DIAGNOSIS — R293 Abnormal posture: Secondary | ICD-10-CM | POA: Diagnosis not present

## 2017-10-26 DIAGNOSIS — M25559 Pain in unspecified hip: Secondary | ICD-10-CM

## 2017-10-26 DIAGNOSIS — M6281 Muscle weakness (generalized): Secondary | ICD-10-CM | POA: Diagnosis not present

## 2017-10-26 DIAGNOSIS — G20 Parkinson's disease: Secondary | ICD-10-CM | POA: Diagnosis not present

## 2017-10-26 DIAGNOSIS — G8929 Other chronic pain: Secondary | ICD-10-CM

## 2017-10-26 DIAGNOSIS — R29898 Other symptoms and signs involving the musculoskeletal system: Secondary | ICD-10-CM | POA: Diagnosis not present

## 2017-10-26 DIAGNOSIS — M48062 Spinal stenosis, lumbar region with neurogenic claudication: Secondary | ICD-10-CM

## 2017-10-26 DIAGNOSIS — R2689 Other abnormalities of gait and mobility: Secondary | ICD-10-CM

## 2017-10-26 DIAGNOSIS — R279 Unspecified lack of coordination: Secondary | ICD-10-CM

## 2017-10-26 DIAGNOSIS — R471 Dysarthria and anarthria: Secondary | ICD-10-CM

## 2017-10-26 DIAGNOSIS — R2681 Unsteadiness on feet: Secondary | ICD-10-CM | POA: Diagnosis not present

## 2017-10-26 DIAGNOSIS — M545 Low back pain, unspecified: Secondary | ICD-10-CM

## 2017-10-26 DIAGNOSIS — R278 Other lack of coordination: Secondary | ICD-10-CM

## 2017-10-26 DIAGNOSIS — M25551 Pain in right hip: Secondary | ICD-10-CM

## 2017-10-26 DIAGNOSIS — R262 Difficulty in walking, not elsewhere classified: Secondary | ICD-10-CM

## 2017-10-26 DIAGNOSIS — R29818 Other symptoms and signs involving the nervous system: Secondary | ICD-10-CM

## 2017-10-26 DIAGNOSIS — G20A1 Parkinson's disease without dyskinesia, without mention of fluctuations: Secondary | ICD-10-CM

## 2017-10-26 NOTE — Therapy (Signed)
[place] MAIN [facility] SERVICES [address] [place], [place], [address] Phone: [phone]   Fax:  [phone]  Physical Therapy Treatment  Patient Details  Name: [name] MRN: [id number] Date of Birth: 08/09/1950 Referring Provider: Wells Guiles Tat   Encounter Date: 10/26/2017  PT End of Session - 10/26/17 1718    Visit Number  46    Number of Visits  49    Date for PT Re-Evaluation  11/09/17    PT Start Time  1030    PT Stop Time  1120    PT Time Calculation (min)  50 min    Activity Tolerance  Patient tolerated treatment well    Behavior During Therapy  [facility] for tasks assessed/performed       Past Medical History:  Diagnosis Date  . Arthritis   . Bradycardia   . Cancer [facility]) 2013   skin cancer  . Depression    ptsd  . Dysrhythmia    chronic slow heart rate  . GERD (gastroesophageal reflux disease)   . Headache(784.0)    tension headaches non recent  . History of chicken pox   . History of kidney stones    passed  . Hypertension    treated with HCTZ  . Parkinson's disease ([place])    dx'ed 15 years ago  . PTSD (post-traumatic stress disorder)   . Shortness of breath dyspnea   . Sleep apnea    doesn't use C-pap  . Varicose veins     Past Surgical History:  Procedure Laterality Date  . CHOLECYSTECTOMY N/A 10/22/2014   Procedure: LAPAROSCOPIC CHOLECYSTECTOMY WITH INTRAOPERATIVE CHOLANGIOGRAM;  Surgeon: Dia Crawford III, MD;  Location: [facility] ORS;  Service: General;  Laterality: N/A;  . cyst removed      from lip as a child  . LUMBAR LAMINECTOMY/DECOMPRESSION MICRODISCECTOMY Bilateral 12/14/2012   Procedure: Bilateral lumbar three-four, four-five decompressive laminotomy/foraminotomy;  Surgeon: Charlie Pitter, MD;  Location: [place] NEURO ORS;  Service: Neurosurgery;  Laterality: Bilateral;  . PULSE GENERATOR IMPLANT Bilateral 12/13/2013   Procedure: Bilateral implantable pulse generator placement;  Surgeon: Erline Levine, MD;  Location:  [place] NEURO ORS;  Service: Neurosurgery;  Laterality: Bilateral;  Bilateral implantable pulse generator placement  . skin cancer removed     from ears,   12 lft arm  rt leg 15  . SUBTHALAMIC STIMULATOR BATTERY REPLACEMENT Bilateral 07/14/2017   Procedure: BILATERAL IMPLANTED PULSE GENERATOR CHANGE FOR DEEP BRAIN STIMULATOR;  Surgeon: Erline Levine, MD;  Location: [place];  Service: Neurosurgery;  Laterality: Bilateral;  . SUBTHALAMIC STIMULATOR INSERTION Bilateral 12/06/2013   Procedure: SUBTHALAMIC STIMULATOR INSERTION;  Surgeon: Erline Levine, MD;  Location: [place] NEURO ORS;  Service: Neurosurgery;  Laterality: Bilateral;  Bilateral deep brain stimulator placement    There were no vitals filed for this visit.  Subjective Assessment - 10/26/17 1715    Subjective  Pt reports fall on Sunday as noted in last note. Pt does have laceration on L arm dressed with band aid and Tegaderm. Brusiing noted L hip and back. Pt denies any other falls or pain at this time.     Patient is accompained by:  Family member    Pertinent History  Patient referred for physical therapy due to wife's request for patient to get aquatic therapy to help with his conditioning. Pt is also complaining of chronic low back and R hip pain and frequent falls. 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 The last time he was in the water he was very uncomfortable because he didn't feel like he had any control. Pt has a history of 2 low back surgeries in August 2014 and February 2016. Pt and wife are unable to recall exactly what was performed but they know that he had stenosis. Surgeries were performed by Dr. Annette Stable in [place]. Pt reports that his low back and R hip pain are constant. He describes the pain in the back as "dull" and the pain in the hip as "sharp." For both back and hip worst pain is 10/10, Best: 2/10, Present: 2/10. No particular episode of trauma to his low back or hip preceding  the pain. Pt has a history of Parkinson's with a deep brain stimulator. He ambulates with a 4 wheeled walker called a U-step (automatic brakes, audio cues for stepping). He states that he falls frequently and reports that he has had approximately 40-50 falls in the last 6 months. No specific recent changes in his health.       Enters/exits via ramp  * 2# ankle wts throughout session for improved grounding/stability of LEs  Ambulation, Blue dumbbells  Fwd 4 L  Side 4 L  High knee march 4 L  Bench  STS, 15x  STS with R foot propped (straight leg) for LLE use only, 15x  Seated straight leg up and out, B 2 x 10 each  Modified plank B hip ext, 2 x 10 ea  Standing dynamic lunges, B 20x ea  Fwd  Side                         PT Education - 10/26/17 1716    Education provided  Yes    Education Details  use of ankle wts to provide increased strength work to ambulation and other exercises, but also greater stability/grounding of LEs     Person(s) Educated  Patient    Methods  Explanation    Comprehension  Verbalized understanding;Returned demonstration       PT Short Term Goals - 07/19/17 1741      PT SHORT TERM GOAL #1   Title  Pt will perform HEP with family's supervision, for improved balance, transfers, and gait.      Baseline  patient reports compliance, however not every day    Time  4    Period  Weeks    Status  Partially Met      PT SHORT TERM GOAL #2   Title  Pt will perform 5x sit<>stand in less than or equal to 15 seconds for improved efficiency and safety with transfers.    Baseline  19 seconds; deffered at this time due to excessive LOB     Time  2    Period  Weeks    Status  On-going        PT Long Term Goals - 09/14/17 7517      PT LONG TERM GOAL #1   Title  Pt will decrease 5TSTS by at least 3 seconds in order to demonstrate clinically significant improvement in LE strength     Baseline  02/08/17: 20.1 seconds 12/18: 17 seconds ;  09/14/17 =24.34 sec with assist due to poor static standing balance    Time  8    Period  Weeks    Status  Achieved      PT LONG TERM GOAL #2   Title  Pt will decrease  TUG by at least 3.4s in order to demonstrate decreased fall risk     Baseline  02/08/17: 22.1s 12/18: 20 seconds 2/7: 19 seconds; 4/3: 23 seconds with walker, 09/14/1913.12 sec    Time  8    Period  Weeks    Status  Partially Met    Target Date  11/09/17      PT LONG TERM GOAL #3   Title  Pt will improve BERG by at least 3 points in order to demonstrate clinically significant improvement in balance.    Baseline  02/08/17: 30/56 12/18: 38/56      Time  8    Period  Weeks    Status  Achieved    Target Date  --      PT LONG TERM GOAL #4   Title  Pt/wife will verbalize understanding of plans for continued community fitness upon D/C from PT including aquatic therapy program    Baseline  02/08/17: pt currently reports being more comfortable in the water    Time  8    Period  Weeks    Status  Partially Met    Target Date  11/09/17      PT LONG TERM GOAL #5   Title  Pt will decrease mODI scoreby at least 13 points in order demonstrate clinically significant reduction in pain/disability     Baseline  02/08/17: 54% 12/18: 46% 2/7: 42% 4/3; 50%, 09/14/17 42%    Time  8    Period  Weeks    Status  Partially Met    Target Date  11/09/17      PT LONG TERM GOAL #6   Title  Pt will improve BERG to 44/56  in order to demonstrate clinically significant improvement in balance for decreased fall risk    Baseline  12/18: 38/56 2/7: 41/56 4/3: 41/56, 09/14/17  19/56    Time  8    Period  Weeks    Status  Partially Met    Target Date  11/09/17            Plan - 10/26/17 1719    Clinical Impression Statement  Pt tolerated session well with improved ambulation activities with ankle weights helping pt to ground feet and allow pt to use core more effectively to gain/regain balance. Tolerated increased resistance with all  movements of LEs. LLE continues to be weaker and quite evident with STS transfers. Progressed to work on unilateral STS on L with RLE propped  behind heel. Continue to progress strength and balance to improve functional mobility and decreased falls.     Rehab Potential  Fair    PT Frequency  2x / week    PT Duration  8 weeks    PT Treatment/Interventions  ADLs/Self Care Home Management;Aquatic Therapy;Cryotherapy;Electrical Stimulation;Iontophoresis 88m/ml Dexamethasone;Moist Heat;Ultrasound;Contrast Bath;DME Instruction;Gait training;Stair training;Functional mobility training;Therapeutic activities;Therapeutic exercise;Balance training;Neuromuscular re-education;Patient/family education;Manual techniques;Wheelchair mobility training;Energy conservation;Passive range of motion    PT Next Visit Plan  progress balance, strength, aquatic therapy    PT Home Exercise Plan  None provided at this time until further assessed, wife reports pt [facility]    Consulted and Agree with Plan of Care  Patient       Patient will benefit from skilled therapeutic intervention in order to improve the following deficits and impairments:  Abnormal gait, Decreased balance, Decreased coordination, Decreased mobility, Impaired tone, Postural dysfunction  Visit Diagnosis: Muscle weakness (generalized)  Other lack of coordination  Other abnormalities of gait and mobility  Repeated falls  Unsteadiness on feet  Unspecified lack of coordination  Abnormal posture  Chronic midline low back pain without sciatica  Other symptoms and signs involving the musculoskeletal system  Spinal stenosis, lumbar region, with neurogenic claudication  Parkinson's disease ([place])  Difficulty walking  Pain in right hip  Arthralgia of hip, unspecified laterality  Dysarthria and anarthria  Other symptoms and signs involving the nervous system     Problem List Patient Active Problem List    Diagnosis Date Noted  . Hematoma 10/18/2017  . Accidental fall 10/18/2017  . 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 Radicular pain in right arm 10/09/2016  . Rash 09/03/2016  . GERD (gastroesophageal reflux disease) 07/31/2016  . Trochanteric bursitis 03/03/2016  . Left hand pain 10/30/2015  . Fracture, finger, multiple sites 10/30/2015  . Colon cancer screening 07/23/2015  . Healthcare maintenance 07/23/2015  . Gout 02/18/2015  . Depression 01/06/2015  . Irritation of eyelid 08/20/2014  . Dupuytren's contracture 08/20/2014  . Cough 07/21/2014  . Lumbar stenosis with neurogenic claudication 06/13/2014  . Preop cardiovascular exam 05/28/2014  . S/P deep brain stimulator placement 05/08/2014  . Joint pain 01/22/2014  . Medicare annual wellness visit, initial 01/19/2014  . Advance care planning 01/19/2014  . Parkinson's disease ([place]) 12/13/2013  . PTSD (post-traumatic stress disorder) 06/13/2013  . Erectile dysfunction 06/13/2013  . HLD (hyperlipidemia) 06/13/2013  . Hip pain 06/10/2013  . Pain in joint, shoulder region 06/10/2013  . Right leg swelling 06/03/2013  . Essential hypertension 06/03/2013  . Bradycardia by electrocardiogram 06/03/2013  . Obstructive sleep apnea 03/12/2013  . Spinal stenosis, lumbar region, with neurogenic claudication 12/14/2012    Larae Grooms 10/26/2017, 5:23 PM  [place] MAIN [facility] SERVICES [place], [place], [address] Phone: [phone]   Fax:  [phone]  Name: TASHI BAND MRN: [id number] Date of Birth: [birth year]

## 2017-10-29 ENCOUNTER — Other Ambulatory Visit: Payer: Self-pay | Admitting: Family Medicine

## 2017-10-31 ENCOUNTER — Encounter: Payer: Self-pay | Admitting: Occupational Therapy

## 2017-10-31 ENCOUNTER — Ambulatory Visit: Payer: Medicare PPO | Admitting: Psychology

## 2017-10-31 ENCOUNTER — Ambulatory Visit: Payer: Medicare PPO

## 2017-10-31 ENCOUNTER — Ambulatory Visit: Payer: Medicare PPO | Admitting: Occupational Therapy

## 2017-10-31 DIAGNOSIS — M6281 Muscle weakness (generalized): Secondary | ICD-10-CM

## 2017-10-31 DIAGNOSIS — R279 Unspecified lack of coordination: Secondary | ICD-10-CM | POA: Diagnosis not present

## 2017-10-31 DIAGNOSIS — R2689 Other abnormalities of gait and mobility: Secondary | ICD-10-CM | POA: Diagnosis not present

## 2017-10-31 DIAGNOSIS — R278 Other lack of coordination: Secondary | ICD-10-CM

## 2017-10-31 DIAGNOSIS — R296 Repeated falls: Secondary | ICD-10-CM | POA: Diagnosis not present

## 2017-10-31 DIAGNOSIS — R29898 Other symptoms and signs involving the musculoskeletal system: Secondary | ICD-10-CM | POA: Diagnosis not present

## 2017-10-31 DIAGNOSIS — R2681 Unsteadiness on feet: Secondary | ICD-10-CM | POA: Diagnosis not present

## 2017-10-31 DIAGNOSIS — G20 Parkinson's disease: Secondary | ICD-10-CM | POA: Diagnosis not present

## 2017-10-31 DIAGNOSIS — R293 Abnormal posture: Secondary | ICD-10-CM | POA: Diagnosis not present

## 2017-10-31 NOTE — Therapy (Signed)
[place] MAIN [facility] SERVICES [address] [place], [place], [address] Phone: [phone]   Fax:  [phone]  Physical Therapy Treatment  Patient Details  Name: [name] MRN: [id number] Date of Birth: [birth year] Referring Provider: Wells Guiles Tat   Encounter Date: 10/31/2017  PT End of Session - 10/31/17 0958    Visit Number  66    Number of Visits  49    Date for PT Re-Evaluation  11/09/17    PT Start Time  0931    PT Stop Time  1015    PT Time Calculation (min)  44 min    Equipment Utilized During Treatment  Gait belt    Activity Tolerance  Patient tolerated treatment well    Behavior During Therapy  [facility] for tasks assessed/performed       Past Medical History:  Diagnosis Date  . Arthritis   . Bradycardia   . Cancer [facility]) 2013   skin cancer  . Depression    ptsd  . Dysrhythmia    chronic slow heart rate  . GERD (gastroesophageal reflux disease)   . Headache(784.0)    tension headaches non recent  . History of chicken pox   . History of kidney stones    passed  . Hypertension    treated with HCTZ  . Parkinson's disease ([place])    dx'ed 15 years ago  . PTSD (post-traumatic stress disorder)   . Shortness of breath dyspnea   . Sleep apnea    doesn't use C-pap  . Varicose veins     Past Surgical History:  Procedure Laterality Date  . CHOLECYSTECTOMY N/A 10/22/2014   Procedure: LAPAROSCOPIC CHOLECYSTECTOMY WITH INTRAOPERATIVE CHOLANGIOGRAM;  Surgeon: Dia Crawford III, MD;  Location: [facility] ORS;  Service: General;  Laterality: N/A;  . cyst removed      from lip as a child  . LUMBAR LAMINECTOMY/DECOMPRESSION MICRODISCECTOMY Bilateral 12/14/2012   Procedure: Bilateral lumbar three-four, four-five decompressive laminotomy/foraminotomy;  Surgeon: Charlie Pitter, MD;  Location: [place] NEURO ORS;  Service: Neurosurgery;  Laterality: Bilateral;  . PULSE GENERATOR IMPLANT Bilateral 12/13/2013   Procedure: Bilateral implantable pulse generator  placement;  Surgeon: Erline Levine, MD;  Location: [place] NEURO ORS;  Service: Neurosurgery;  Laterality: Bilateral;  Bilateral implantable pulse generator placement  . skin cancer removed     from ears,   12 lft arm  rt leg 15  . SUBTHALAMIC STIMULATOR BATTERY REPLACEMENT Bilateral 07/14/2017   Procedure: BILATERAL IMPLANTED PULSE GENERATOR CHANGE FOR DEEP BRAIN STIMULATOR;  Surgeon: Erline Levine, MD;  Location: [place];  Service: Neurosurgery;  Laterality: Bilateral;  . SUBTHALAMIC STIMULATOR INSERTION Bilateral 12/06/2013   Procedure: SUBTHALAMIC STIMULATOR INSERTION;  Surgeon: Erline Levine, MD;  Location: [place] NEURO ORS;  Service: Neurosurgery;  Laterality: Bilateral;  Bilateral deep brain stimulator placement    There were no vitals filed for this visit.  Subjective Assessment - 10/31/17 0937    Subjective  Patient reports another fall since last session. Fell on Sunday again, having shoulder and hip. Will be going to a care facility for 4-5 days while wife is away on a trip.     Patient is accompained by:  Family member    Pertinent History  Patient referred for physical therapy due to wife's request for patient to get aquatic therapy to help with his conditioning. Pt is also complaining of chronic low back and R hip pain and frequent falls. 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 The last time he was in the water he was very uncomfortable because he didn't feel like he had any control. Pt has a history of 2 low back surgeries in August 2014 and February 2016. Pt and wife are unable to recall exactly what was performed but they know that he had stenosis. Surgeries were performed by Dr. Annette Stable in [place]. Pt reports that his low back and R hip pain are constant. He describes the pain in the back as "dull" and the pain in the hip as "sharp." For both back and hip worst pain is 10/10, Best: 2/10, Present: 2/10. No particular episode of trauma to his  low back or hip preceding the pain. Pt has a history of Parkinson's with a deep brain stimulator. He ambulates with a 4 wheeled walker called a U-step (automatic brakes, audio cues for stepping). He states that he falls frequently and reports that he has had approximately 40-50 falls in the last 6 months. No specific recent changes in his health.     Currently in Pain?  Yes    Pain Score  2     Pain Location  Back hip and shoulder    Pain Orientation  Lower    Pain Descriptors / Indicators  Aching    Pain Type  Acute pain    Pain Onset  Yesterday    Pain Frequency  Constant        -Nustep L5, 3 minutes for warm-up (unbilled); LE only      -Standing marching 5# ankle weight x 15; 2 sets  bilateral, BUE support; Frequent forward momentum requiring cueing for upright posture and to bring legs in line with trunk and lift head up.    -Standing abduction 5# ankle weight x 10 bilateral, BUE support; 2 sets    -Standing hamstring curl 5# ankle weight x 15 bilateral, BUE support;   -Standing hip extension 5# ankle weight x 10 bilateral, BUE support; cues for keeping leg straight and trunk upright.     -Standing hip flexion with straight leg 5# ankle weight x 10 bilateral, BUE support   -seated LAQ  5lb ankle weights 10x each leg 3 second holds    -seated adduction squeezes 20x   -seated marches 5lb ankle weights 10x ; cues for upright posture    -heel raises 15x BUE support standing  -seated scapular retractions 10x    Neuromuscular Re-education   6" step step-ups alternating LE x 20 each, verbal cues for L and R for pace. widening BOS, and accuracy of tap   -Airex pad balance with feet apart eyes open 5 x 30s,      Pt. response to medical necessity: Patient will continue to benefit from skilled physical therapy to improve static and dynamic standing balance and decrease fall risk for improved functional capacity of activities                          PT  Education - 10/31/17 0957    Education provided  Yes    Education Details  HEP to perform while at care facility while wife is away.     Person(s) Educated  Patient    Methods  Explanation;Demonstration;Verbal cues    Comprehension  Verbalized understanding;Returned demonstration       PT Short Term Goals - 07/19/17 1741      PT SHORT TERM GOAL #1   Title  Pt  will perform HEP with family's supervision, for improved balance, transfers, and gait.      Baseline  patient reports compliance, however not every day    Time  4    Period  Weeks    Status  Partially Met      PT SHORT TERM GOAL #2   Title  Pt will perform 5x sit<>stand in less than or equal to 15 seconds for improved efficiency and safety with transfers.    Baseline  19 seconds; deffered at this time due to excessive LOB     Time  2    Period  Weeks    Status  On-going        PT Long Term Goals - 09/14/17 8756      PT LONG TERM GOAL #1   Title  Pt will decrease 5TSTS by at least 3 seconds in order to demonstrate clinically significant improvement in LE strength     Baseline  02/08/17: 20.1 seconds 12/18: 17 seconds ; 09/14/17 =24.34 sec with assist due to poor static standing balance    Time  8    Period  Weeks    Status  Achieved      PT LONG TERM GOAL #2   Title  Pt will decrease TUG by at least 3.4s in order to demonstrate decreased fall risk     Baseline  02/08/17: 22.1s 12/18: 20 seconds 2/7: 19 seconds; 4/3: 23 seconds with walker, 09/14/1913.12 sec    Time  8    Period  Weeks    Status  Partially Met    Target Date  11/09/17      PT LONG TERM GOAL #3   Title  Pt will improve BERG by at least 3 points in order to demonstrate clinically significant improvement in balance.    Baseline  02/08/17: 30/56 12/18: 38/56      Time  8    Period  Weeks    Status  Achieved    Target Date  --      PT LONG TERM GOAL #4   Title  Pt/wife will verbalize understanding of plans for continued community fitness upon D/C  from PT including aquatic therapy program    Baseline  02/08/17: pt currently reports being more comfortable in the water    Time  8    Period  Weeks    Status  Partially Met    Target Date  11/09/17      PT LONG TERM GOAL #5   Title  Pt will decrease mODI scoreby at least 13 points in order demonstrate clinically significant reduction in pain/disability     Baseline  02/08/17: 54% 12/18: 46% 2/7: 42% 4/3; 50%, 09/14/17 42%    Time  8    Period  Weeks    Status  Partially Met    Target Date  11/09/17      PT LONG TERM GOAL #6   Title  Pt will improve BERG to 44/56  in order to demonstrate clinically significant improvement in balance for decreased fall risk    Baseline  12/18: 38/56 2/7: 41/56 4/3: 41/56, 09/14/17  19/56    Time  8    Period  Weeks    Status  Partially Met    Target Date  11/09/17            Plan - 10/31/17 1005    Clinical Impression Statement  Patient has difficulty with task orientation today due to being emotional  about going to care facility for a few days while wife has to go away on a trip. Knee buckling bilateral with fatigue resulting in need for BUE support. Patient will continue to benefit from skilled physical therapy to improve static and dynamic standing balance and decrease fall risk for improved functional capacity of activities    Rehab Potential  Fair    PT Frequency  2x / week    PT Duration  8 weeks    PT Treatment/Interventions  ADLs/Self Care Home Management;Aquatic Therapy;Cryotherapy;Electrical Stimulation;Iontophoresis '4mg'$ /ml Dexamethasone;Moist Heat;Ultrasound;Contrast Bath;DME Instruction;Gait training;Stair training;Functional mobility training;Therapeutic activities;Therapeutic exercise;Balance training;Neuromuscular re-education;Patient/family education;Manual techniques;Wheelchair mobility training;Energy conservation;Passive range of motion    PT Next Visit Plan  progress balance, strength, aquatic therapy    PT Home Exercise Plan   None provided at this time until further assessed, wife reports pt does not perform current HEP from [facility]    Consulted and Agree with Plan of Care  Patient       Patient will benefit from skilled therapeutic intervention in order to improve the following deficits and impairments:  Abnormal gait, Decreased balance, Decreased coordination, Decreased mobility, Impaired tone, Postural dysfunction  Visit Diagnosis: Muscle weakness (generalized)  Other lack of coordination  Other abnormalities of gait and mobility  Repeated falls     Problem List Patient Active Problem List   Diagnosis Date Noted  . Hematoma 10/18/2017  . Accidental fall 10/18/2017  . 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 Radicular pain in right arm 10/09/2016  . Rash 09/03/2016  . GERD (gastroesophageal reflux disease) 07/31/2016  . Trochanteric bursitis 03/03/2016  . Left hand pain 10/30/2015  . Fracture, finger, multiple sites 10/30/2015  . Colon cancer screening 07/23/2015  . Healthcare maintenance 07/23/2015  . Gout 02/18/2015  . Depression 01/06/2015  . Irritation of eyelid 08/20/2014  . Dupuytren's contracture 08/20/2014  . Cough 07/21/2014  . Lumbar stenosis with neurogenic claudication 06/13/2014  . Preop cardiovascular exam 05/28/2014  . S/P deep brain stimulator placement 05/08/2014  . Joint pain 01/22/2014  . Medicare annual wellness visit, initial 01/19/2014  . Advance care planning 01/19/2014  . Parkinson's disease ([place]) 12/13/2013  . PTSD (post-traumatic stress disorder) 06/13/2013  . Erectile dysfunction 06/13/2013  . HLD (hyperlipidemia) 06/13/2013  . Hip pain 06/10/2013  . Pain in joint, shoulder region 06/10/2013  . Right leg swelling 06/03/2013  . Essential hypertension 06/03/2013  . Bradycardia by electrocardiogram 06/03/2013  . Obstructive sleep apnea 03/12/2013  . Spinal stenosis, lumbar region, with neurogenic claudication 12/14/2012   Janna Arch, PT, DPT   10/31/2017,  10:15 AM  [place] MAIN [facility] SERVICES [place], [place], [address] Phone: [phone]   Fax:  [phone]  Name: REMBERTO LIENHARD MRN: [id number] Date of Birth: 1951/02/24

## 2017-10-31 NOTE — Therapy (Signed)
[place] MAIN [facility] SERVICES [address] [place], [place], [address] Phone: [phone]   Fax:  [phone]  Occupational Therapy Treatment  Patient Details  Name: [name] MRN: [id number] Date of Birth: 10-17-1950 Referring Provider: Wells Guiles Tat   Encounter Date: 10/31/2017  OT End of Session - 10/31/17 0857    Visit Number  18    Number of Visits  24    Date for OT Re-Evaluation  01/16/18    Authorization Type  Visit 7 of 10 for progress reporting period starting 07/31/2017    OT Start Time  0845    OT Stop Time  0930    OT Time Calculation (min)  45 min    Activity Tolerance  Patient tolerated treatment well    Behavior During Therapy  [facility] for tasks assessed/performed       Past Medical History:  Diagnosis Date  . Arthritis   . Bradycardia   . Cancer [facility]) 2013   skin cancer  . Depression    ptsd  . Dysrhythmia    chronic slow heart rate  . GERD (gastroesophageal reflux disease)   . Headache(784.0)    tension headaches non recent  . History of chicken pox   . History of kidney stones    passed  . Hypertension    treated with HCTZ  . Parkinson's disease ([place])    dx'ed 15 years ago  . PTSD (post-traumatic stress disorder)   . Shortness of breath dyspnea   . Sleep apnea    doesn't use C-pap  . Varicose veins     Past Surgical History:  Procedure Laterality Date  . CHOLECYSTECTOMY N/A 10/22/2014   Procedure: LAPAROSCOPIC CHOLECYSTECTOMY WITH INTRAOPERATIVE CHOLANGIOGRAM;  Surgeon: Dia Crawford III, MD;  Location: [facility] ORS;  Service: General;  Laterality: N/A;  . cyst removed      from lip as a child  . LUMBAR LAMINECTOMY/DECOMPRESSION MICRODISCECTOMY Bilateral 12/14/2012   Procedure: Bilateral lumbar three-four, four-five decompressive laminotomy/foraminotomy;  Surgeon: Charlie Pitter, MD;  Location: [place] NEURO ORS;  Service: Neurosurgery;  Laterality: Bilateral;  . PULSE GENERATOR IMPLANT Bilateral 12/13/2013    Procedure: Bilateral implantable pulse generator placement;  Surgeon: Erline Levine, MD;  Location: [place] NEURO ORS;  Service: Neurosurgery;  Laterality: Bilateral;  Bilateral implantable pulse generator placement  . skin cancer removed     from ears,   12 lft arm  rt leg 15  . SUBTHALAMIC STIMULATOR BATTERY REPLACEMENT Bilateral 07/14/2017   Procedure: BILATERAL IMPLANTED PULSE GENERATOR CHANGE FOR DEEP BRAIN STIMULATOR;  Surgeon: Erline Levine, MD;  Location: [place];  Service: Neurosurgery;  Laterality: Bilateral;  . SUBTHALAMIC STIMULATOR INSERTION Bilateral 12/06/2013   Procedure: SUBTHALAMIC STIMULATOR INSERTION;  Surgeon: Erline Levine, MD;  Location: [place] NEURO ORS;  Service: Neurosurgery;  Laterality: Bilateral;  Bilateral deep brain stimulator placement    There were no vitals filed for this visit.  Subjective Assessment - 10/31/17 0854    Subjective   Pt. reports he has been sleeping between 15-20 hours a day.    Patient is accompained by:  Family member    Pertinent History  Pt. is a 67 y.o. mlae who has a history of Parkinson's Disease. Pt. has a Deep Brain Stimulator in place. Pt. has a history of Low Back Pain with 2 back surgeries in August 2014, and February 2016. Pt. has a history of multiple frequent falls occurring at a rate of 6x's a week.     Limitations  Frequent falls, limited motor control, and [place].     Patient Stated Goals  To return home.    Currently in Pain?  Yes    Pain Score  2     Pain Location  Back    Pain Orientation  Lower    Pain Descriptors / Indicators  Aching    Pain Type  Acute pain       OT TREATMENT    Neuro muscular re-education:  Pt. worked on left hand [facility] skills, grasping, and manipulating 1/4" pegs with his left hand. Pt. worked on hand function skills moving the pegs through his hand for storage, as well as moving the pegs from his palm to the tip of his 2nd digit, and thumb in preparation for placing them in the pegboard. Pt. Dropped several pegs  from the palm of his hand. Pt. Removed the pegs while alternating thumb opposition to the tip of his 2nd through 5th digits.  Therapeutic Exercise:  Pt. performed gross gripping with grip strengthener. Pt. worked on sustaining grip while grasping pegs and reaching at various heights. Gripper was placed in the 3rd resistive slot with the white resistive spring. Pt. Worked on pinch strengthening in the bilateral hands for lateral, and 3pt. pinch using yellow, red, green, and blue resistive clips. Pt. worked on placing the clips at various vertical and horizontal angles. Tactile and verbal cues were required for eliciting the desired movement.                         OT Education - 10/31/17 0856    Education provided  Yes    Education Details  [facility]    Person(s) Educated  Patient    Methods  Demonstration;Verbal cues;Explanation    Comprehension  Verbal cues required;Verbalized understanding          OT Long Term Goals - 10/24/17 3382      OT LONG TERM GOAL #1   Title  Pt. will improve Bilateral UE strength by 1 mm grade to assist with ADLs, and IADLs    Baseline  10/24/2017: BUE strength: 4+/5 overall    Time  12    Period  Weeks    Status  On-going    Target Date  01/16/18      OT LONG TERM GOAL #2   Title  Pt. will improve grip strength to be able to pour himself a beverage.    Baseline  10/24/2017: Pt. continues to be unable to hold and pour a beevrage.    Time  12    Period  Weeks    Status  On-going    Target Date  01/16/18      OT LONG TERM GOAL #3   Title  Pt. will improve lateral pinch stength to be able to independently turn a key.     Baseline  10/24/2017: Pt. continues to have difficulty holding, and turning  key.    Time  12    Period  Weeks    Status  On-going    Target Date  01/16/18      OT LONG TERM GOAL #4   Title  Pt. will improve Right hand [facility] skills to be able to independently grasp and use small objects needed for fixing car parts.     Baseline  01/16/2018: 45sec. on the 9 hole peg test.    Time  12    Period  Weeks    Status  On-going  Target Date  01/16/18      OT LONG TERM GOAL #5   Title  Pt. will be demonstarte self-feeding with modified independence with minimal spillage.    Baseline  01/16/2018: Pt. continues to require assistance, and has alot of spillage.    Time  12    Period  Weeks    Status  On-going    Target Date  01/16/18      OT LONG TERM GOAL #6   Title  Pt. will manipulate buttons, and cuff buttons independently.    Baseline  10/24/2017: Pt. has difficulty     Time  12    Period  Weeks    Status  On-going    Target Date  01/16/18      OT LONG TERM GOAL #7   Title  Pt. will write 1 sentence efficiently with 90% legibility.    Baseline  10/24/2017: name only 75% legibility    Time  12    Period  Weeks    Status  On-going    Target Date  01/16/18      OT LONG TERM GOAL #8   Title  Pt. will improve typing speed, and accuracy to be able to to type emails efficently    Baseline  10/24/2017: Typing speed: 6 wpm, Accuracy: 33% with moderate cues for hand placement    Time  12    Period  Weeks    Status  On-going    Target Date  01/16/18            Plan - 10/31/17 0857    Clinical Impression Statement  Pt. reports he has had another fall, and hit his left forearm. Pt. reports that his back pain is less now following the fall. Pt. reports being concerned that he is sleeping between 15-20 hours a day. Pt. continues to work on improving UE strength, and coordination skills in order to improve functional engagement, and participation is ADL, and IADL tasks.     Occupational performance deficits (Please refer to evaluation for details):  ADL's;IADL's    Rehab Potential  Good    Current Impairments/barriers affecting progress:  Positive Indicators: age, familiy support, motivation. Negative Indicators: Multiple comorbidities, and history of falls.    OT Frequency  2x / week    OT  Treatment/Interventions  Self-care/ADL training;Therapeutic exercise;DME and/or AE instruction;Energy conservation;Neuromuscular education;Patient/family education;Therapeutic activities    Clinical Decision Making  Several treatment options, min-mod task modification necessary    OT Home Exercise Plan  HEP for using rubber bands for strengthening at home and work on more handwriting.    Consulted and Agree with Plan of Care  Patient       Patient will benefit from skilled therapeutic intervention in order to improve the following deficits and impairments:  Pain, Decreased strength, Decreased coordination, Decreased balance, Impaired UE functional use, Decreased safety awareness, Decreased activity tolerance, Decreased cognition, Impaired flexibility, Impaired vision/preception  Visit Diagnosis: Muscle weakness (generalized)  Other lack of coordination    Problem List Patient Active Problem List   Diagnosis Date Noted  . Hematoma 10/18/2017  . Accidental fall 10/18/2017  . 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 Radicular pain in right arm 10/09/2016  . Rash 09/03/2016  . GERD (gastroesophageal reflux disease) 07/31/2016  . Trochanteric bursitis 03/03/2016  . Left hand pain 10/30/2015  . Fracture, finger, multiple sites 10/30/2015  . Colon cancer screening 07/23/2015  . Healthcare maintenance 07/23/2015  . Gout 02/18/2015  . Depression 01/06/2015  .  Irritation of eyelid 08/20/2014  . Dupuytren's contracture 08/20/2014  . Cough 07/21/2014  . Lumbar stenosis with neurogenic claudication 06/13/2014  . Preop cardiovascular exam 05/28/2014  . S/P deep brain stimulator placement 05/08/2014  . Joint pain 01/22/2014  . Medicare annual wellness visit, initial 01/19/2014  . Advance care planning 01/19/2014  . Parkinson's disease ([place]) 12/13/2013  . PTSD (post-traumatic stress disorder) 06/13/2013  . Erectile dysfunction 06/13/2013  . HLD (hyperlipidemia) 06/13/2013  . Hip pain  06/10/2013  . Pain in joint, shoulder region 06/10/2013  . Right leg swelling 06/03/2013  . Essential hypertension 06/03/2013  . Bradycardia by electrocardiogram 06/03/2013  . Obstructive sleep apnea 03/12/2013  . Spinal stenosis, lumbar region, with neurogenic claudication 12/14/2012    Harrel Carina, MS, OTR/L 10/31/2017, 9:09 AM  [place] MAIN [facility] SERVICES [address] [place], [place], [address] Phone: [phone]   Fax:  [phone]  Name: RISHARD DELANGE MRN: [id number] Date of Birth: Jul 07, 1950

## 2017-11-01 DIAGNOSIS — G4733 Obstructive sleep apnea (adult) (pediatric): Secondary | ICD-10-CM | POA: Diagnosis not present

## 2017-11-01 DIAGNOSIS — G20 Parkinson's disease: Secondary | ICD-10-CM | POA: Diagnosis not present

## 2017-11-02 ENCOUNTER — Ambulatory Visit: Payer: Self-pay

## 2017-11-02 DIAGNOSIS — G4733 Obstructive sleep apnea (adult) (pediatric): Secondary | ICD-10-CM | POA: Diagnosis not present

## 2017-11-02 DIAGNOSIS — G20 Parkinson's disease: Secondary | ICD-10-CM | POA: Diagnosis not present

## 2017-11-07 ENCOUNTER — Ambulatory Visit: Payer: Medicare PPO

## 2017-11-07 ENCOUNTER — Encounter: Payer: Self-pay | Admitting: Occupational Therapy

## 2017-11-07 ENCOUNTER — Ambulatory Visit: Payer: Medicare PPO | Admitting: Occupational Therapy

## 2017-11-07 ENCOUNTER — Other Ambulatory Visit: Payer: Self-pay

## 2017-11-07 ENCOUNTER — Other Ambulatory Visit: Payer: Self-pay | Admitting: Neurology

## 2017-11-07 DIAGNOSIS — R296 Repeated falls: Secondary | ICD-10-CM

## 2017-11-07 DIAGNOSIS — R278 Other lack of coordination: Secondary | ICD-10-CM | POA: Diagnosis not present

## 2017-11-07 DIAGNOSIS — R279 Unspecified lack of coordination: Secondary | ICD-10-CM | POA: Diagnosis not present

## 2017-11-07 DIAGNOSIS — G20 Parkinson's disease: Secondary | ICD-10-CM | POA: Diagnosis not present

## 2017-11-07 DIAGNOSIS — R29898 Other symptoms and signs involving the musculoskeletal system: Secondary | ICD-10-CM | POA: Diagnosis not present

## 2017-11-07 DIAGNOSIS — R2689 Other abnormalities of gait and mobility: Secondary | ICD-10-CM

## 2017-11-07 DIAGNOSIS — M6281 Muscle weakness (generalized): Secondary | ICD-10-CM | POA: Diagnosis not present

## 2017-11-07 DIAGNOSIS — R293 Abnormal posture: Secondary | ICD-10-CM | POA: Diagnosis not present

## 2017-11-07 DIAGNOSIS — R2681 Unsteadiness on feet: Secondary | ICD-10-CM | POA: Diagnosis not present

## 2017-11-07 NOTE — Therapy (Signed)
[place] MAIN [facility] SERVICES [address] [place], [place], [address] Phone: [phone]   Fax:  [phone]  Occupational Therapy Treatment  Patient Details  Name: [name] MRN: [id number] Date of Birth: 1951/02/01 Referring Provider: Wells Guiles Tat   Encounter Date: 11/07/2017  OT End of Session - 11/07/17 1022    Visit Number  18    Number of Visits  24    Date for OT Re-Evaluation  01/16/18    Authorization Type  Visit 8 of 10 for progress reporting period starting 07/31/2017    OT Start Time  1015    OT Stop Time  1100    OT Time Calculation (min)  45 min    Activity Tolerance  Patient tolerated treatment well    Behavior During Therapy  [facility] for tasks assessed/performed       Past Medical History:  Diagnosis Date  . Arthritis   . Bradycardia   . Cancer [facility]) 2013   skin cancer  . Depression    ptsd  . Dysrhythmia    chronic slow heart rate  . GERD (gastroesophageal reflux disease)   . Headache(784.0)    tension headaches non recent  . History of chicken pox   . History of kidney stones    passed  . Hypertension    treated with HCTZ  . Parkinson's disease ([place])    dx'ed 15 years ago  . PTSD (post-traumatic stress disorder)   . Shortness of breath dyspnea   . Sleep apnea    doesn't use C-pap  . Varicose veins     Past Surgical History:  Procedure Laterality Date  . CHOLECYSTECTOMY N/A 10/22/2014   Procedure: LAPAROSCOPIC CHOLECYSTECTOMY WITH INTRAOPERATIVE CHOLANGIOGRAM;  Surgeon: Dia Crawford III, MD;  Location: [facility] ORS;  Service: General;  Laterality: N/A;  . cyst removed      from lip as a child  . LUMBAR LAMINECTOMY/DECOMPRESSION MICRODISCECTOMY Bilateral 12/14/2012   Procedure: Bilateral lumbar three-four, four-five decompressive laminotomy/foraminotomy;  Surgeon: Charlie Pitter, MD;  Location: [place] NEURO ORS;  Service: Neurosurgery;  Laterality: Bilateral;  . PULSE GENERATOR IMPLANT Bilateral 12/13/2013   Procedure: Bilateral implantable pulse generator placement;  Surgeon: Erline Levine, MD;  Location: [place] NEURO ORS;  Service: Neurosurgery;  Laterality: Bilateral;  Bilateral implantable pulse generator placement  . skin cancer removed     from ears,   12 lft arm  rt leg 15  . SUBTHALAMIC STIMULATOR BATTERY REPLACEMENT Bilateral 07/14/2017   Procedure: BILATERAL IMPLANTED PULSE GENERATOR CHANGE FOR DEEP BRAIN STIMULATOR;  Surgeon: Erline Levine, MD;  Location: [place];  Service: Neurosurgery;  Laterality: Bilateral;  . SUBTHALAMIC STIMULATOR INSERTION Bilateral 12/06/2013   Procedure: SUBTHALAMIC STIMULATOR INSERTION;  Surgeon: Erline Levine, MD;  Location: [place] NEURO ORS;  Service: Neurosurgery;  Laterality: Bilateral;  Bilateral deep brain stimulator placement    There were no vitals filed for this visit.  Subjective Assessment - 11/07/17 1021    Subjective   Pt. reportsthat he continues to sleep between 15-20 hours a day.    Patient is accompained by:  Family member    Pertinent History  Pt. is a 67 y.o. mlae who has a history of Parkinson's Disease. Pt. has a Deep Brain Stimulator in place. Pt. has a history of Low Back Pain with 2 back surgeries in August 2014, and February 2016. Pt. has a history of multiple frequent falls occurring at a rate of 6x's a week.     Limitations  Frequent  falls, limited motor control, and [name].     Patient Stated Goals  To return home.    Currently in Pain?  Yes    Pain Score  3     Pain Location  Back    Pain Orientation  Mid    Pain Descriptors / Indicators  Aching       OT TREATMENT    Selfcare:  Pt. worked on Estate agent, and typing tasks. pt. conitues to present with limited writing legibilty, and speed. Pt. Attempted several widths of pens. Pt. Preferred to use a standard width pen. Pt. was able to sustain a mature grasp on a standard pen without pen movement between the fingers. Pt. presented with limited movement distally in the fingers with writing.  Pt.  worked on prewriting exercises. Pt. typing speed 5 wpm with 21 mistypes, with 20% accuracy. Pt. Worked on formulating catagorical lists both in writing, and typing.                            OT Long Term Goals - 10/24/17 6295      OT LONG TERM GOAL #1   Title  Pt. will improve Bilateral UE strength by 1 mm grade to assist with ADLs, and IADLs    Baseline  10/24/2017: BUE strength: 4+/5 overall    Time  12    Period  Weeks    Status  On-going    Target Date  01/16/18      OT LONG TERM GOAL #2   Title  Pt. will improve grip strength to be able to pour himself a beverage.    Baseline  10/24/2017: Pt. continues to be unable to hold and pour a beevrage.    Time  12    Period  Weeks    Status  On-going    Target Date  01/16/18      OT LONG TERM GOAL #3   Title  Pt. will improve lateral pinch stength to be able to independently turn a key.     Baseline  10/24/2017: Pt. continues to have difficulty holding, and turning  key.    Time  12    Period  Weeks    Status  On-going    Target Date  01/16/18      OT LONG TERM GOAL #4   Title  Pt. will improve Right hand [facility] skills to be able to independently grasp and use small objects needed for fixing car parts.    Baseline  01/16/2018: 45sec. on the 9 hole peg test.    Time  12    Period  Weeks    Status  On-going    Target Date  01/16/18      OT LONG TERM GOAL #5   Title  Pt. will be demonstarte self-feeding with modified independence with minimal spillage.    Baseline  01/16/2018: Pt. continues to require assistance, and has alot of spillage.    Time  12    Period  Weeks    Status  On-going    Target Date  01/16/18      OT LONG TERM GOAL #6   Title  Pt. will manipulate buttons, and cuff buttons independently.    Baseline  10/24/2017: Pt. has difficulty     Time  12    Period  Weeks    Status  On-going    Target Date  01/16/18      OT LONG  TERM GOAL #7   Title  Pt. will write 1 sentence efficiently with  90% legibility.    Baseline  10/24/2017: name only 75% legibility    Time  12    Period  Weeks    Status  On-going    Target Date  01/16/18      OT LONG TERM GOAL #8   Title  Pt. will improve typing speed, and accuracy to be able to to type emails efficently    Baseline  10/24/2017: Typing speed: 6 wpm, Accuracy: 33% with moderate cues for hand placement    Time  12    Period  Weeks    Status  On-going    Target Date  01/16/18            Plan - 11/07/17 1052    Clinical Impression Statement  Pt. reports having had another fall in his living room. this past weekend. Pt. worked on Estate agent, and typing tasks. pt. conitues to present with limited writing legibilty, and speed. Pt. was able to sustain a mature grasp on a standard pen without pen movement between the fingers. Pt. presented with limited movement distally in the fingers with writing.  Pt. worked on prewriting exercises. Pt. typing speed 5 wpm with 21 mistypes, with 20% accuracy. Pt. continues to work on improving UE strength, and [facility] skills for improved engagement in ADL, and IADL tasks.    Occupational performance deficits (Please refer to evaluation for details):  ADL's;IADL's    Rehab Potential  Good    Current Impairments/barriers affecting progress:  Positive Indicators: age, familiy support, motivation. Negative Indicators: Multiple comorbidities, and history of falls.    OT Frequency  2x / week    OT Duration  12 weeks    OT Treatment/Interventions  Self-care/ADL training;Therapeutic exercise;DME and/or AE instruction;Energy conservation;Neuromuscular education;Patient/family education;Therapeutic activities    Clinical Decision Making  Several treatment options, min-mod task modification necessary    OT Home Exercise Plan  HEP for using rubber bands for strengthening at home and work on more handwriting.    Consulted and Agree with Plan of Care  Patient       Patient will benefit from skilled therapeutic intervention  in order to improve the following deficits and impairments:  Pain, Decreased strength, Decreased coordination, Decreased balance, Impaired UE functional use, Decreased safety awareness, Decreased activity tolerance, Decreased cognition, Impaired flexibility, Impaired vision/preception  Visit Diagnosis: Muscle weakness (generalized)  Other lack of coordination    Problem List Patient Active Problem List   Diagnosis Date Noted  . Hematoma 10/18/2017  . Accidental fall 10/18/2017  . 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 Radicular pain in right arm 10/09/2016  . Rash 09/03/2016  . GERD (gastroesophageal reflux disease) 07/31/2016  . Trochanteric bursitis 03/03/2016  . Left hand pain 10/30/2015  . Fracture, finger, multiple sites 10/30/2015  . Colon cancer screening 07/23/2015  . Healthcare maintenance 07/23/2015  . Gout 02/18/2015  . Depression 01/06/2015  . Irritation of eyelid 08/20/2014  . Dupuytren's contracture 08/20/2014  . Cough 07/21/2014  . Lumbar stenosis with neurogenic claudication 06/13/2014  . Preop cardiovascular exam 05/28/2014  . S/P deep brain stimulator placement 05/08/2014  . Joint pain 01/22/2014  . Medicare annual wellness visit, initial 01/19/2014  . Advance care planning 01/19/2014  . Parkinson's disease ([place]) 12/13/2013  . PTSD (post-traumatic stress disorder) 06/13/2013  . Erectile dysfunction 06/13/2013  . HLD (hyperlipidemia) 06/13/2013  . Hip pain 06/10/2013  . Pain in joint, shoulder region 06/10/2013  .  Right leg swelling 06/03/2013  . Essential hypertension 06/03/2013  . Bradycardia by electrocardiogram 06/03/2013  . Obstructive sleep apnea 03/12/2013  . Spinal stenosis, lumbar region, with neurogenic claudication 12/14/2012    Harrel Carina, MS, OTR/L 11/07/2017, 11:01 AM  [place] MAIN [facility] SERVICES [place], [place], [address] Phone: [phone]   Fax:  [phone]  Name:  MYNOR WITKOP MRN: [id number] Date of Birth: 08/26/1950

## 2017-11-07 NOTE — Therapy (Signed)
[place] MAIN [facility] SERVICES [address] [place], [place], [address] Phone: [phone]   Fax:  [phone]  Physical Therapy Treatment Physical Therapy Progress Note   Dates of reporting period  09/14/17  to   11/07/17  Patient Details  Name: [name] MRN: [id number] Date of Birth: 03-27-1951 Referring Provider: Wells Guiles Tat   Encounter Date: 11/07/2017  PT End of Session - 11/07/17 1217    Visit Number  48    Number of Visits  49    Date for PT Re-Evaluation  01/02/18    Authorization Type  1/10 (Pn start 7/23)     PT Start Time  1103    PT Stop Time  1146    PT Time Calculation (min)  43 min    Equipment Utilized During Treatment  Gait belt    Activity Tolerance  Patient tolerated treatment well    Behavior During Therapy  WFL for tasks assessed/performed       Past Medical History:  Diagnosis Date  . Arthritis   . Bradycardia   . Cancer [facility]) 2013   skin cancer  . Depression    ptsd  . Dysrhythmia    chronic slow heart rate  . GERD (gastroesophageal reflux disease)   . Headache(784.0)    tension headaches non recent  . History of chicken pox   . History of kidney stones    passed  . Hypertension    treated with HCTZ  . Parkinson's disease ([place])    dx'ed 15 years ago  . PTSD (post-traumatic stress disorder)   . Shortness of breath dyspnea   . Sleep apnea    doesn't use C-pap  . Varicose veins     Past Surgical History:  Procedure Laterality Date  . CHOLECYSTECTOMY N/A 10/22/2014   Procedure: LAPAROSCOPIC CHOLECYSTECTOMY WITH INTRAOPERATIVE CHOLANGIOGRAM;  Surgeon: Dia Crawford III, MD;  Location: [facility] ORS;  Service: General;  Laterality: N/A;  . cyst removed      from lip as a child  . LUMBAR LAMINECTOMY/DECOMPRESSION MICRODISCECTOMY Bilateral 12/14/2012   Procedure: Bilateral lumbar three-four, four-five decompressive laminotomy/foraminotomy;  Surgeon: Charlie Pitter, MD;  Location: [place] NEURO ORS;  Service:  Neurosurgery;  Laterality: Bilateral;  . PULSE GENERATOR IMPLANT Bilateral 12/13/2013   Procedure: Bilateral implantable pulse generator placement;  Surgeon: Erline Levine, MD;  Location: [place] NEURO ORS;  Service: Neurosurgery;  Laterality: Bilateral;  Bilateral implantable pulse generator placement  . skin cancer removed     from ears,   12 lft arm  rt leg 15  . SUBTHALAMIC STIMULATOR BATTERY REPLACEMENT Bilateral 07/14/2017   Procedure: BILATERAL IMPLANTED PULSE GENERATOR CHANGE FOR DEEP BRAIN STIMULATOR;  Surgeon: Erline Levine, MD;  Location: [place];  Service: Neurosurgery;  Laterality: Bilateral;  . SUBTHALAMIC STIMULATOR INSERTION Bilateral 12/06/2013   Procedure: SUBTHALAMIC STIMULATOR INSERTION;  Surgeon: Erline Levine, MD;  Location: [place] NEURO ORS;  Service: Neurosurgery;  Laterality: Bilateral;  Bilateral deep brain stimulator placement    There were no vitals filed for this visit.  Subjective Assessment - 11/07/17 1106    Subjective  Patient returning after staying in care facility while wife was away on vacation. Had another fall yesterday when attempting to pick something up.     Patient is accompained by:  Family member    Pertinent History  Patient referred for physical therapy due to wife's request for patient to get aquatic therapy to help with his conditioning. Pt is also complaining of chronic low  back and R hip pain and frequent falls. 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 The last time he was in the water he was very uncomfortable because he didn't feel like he had any control. Pt has a history of 2 low back surgeries in August 2014 and February 2016. Pt and wife are unable to recall exactly what was performed but they know that he had stenosis. Surgeries were performed by Dr. Annette Stable in [place]. Pt reports that his low back and R hip pain are constant. He describes the pain in the back as "dull" and the pain in the hip as  "sharp." For both back and hip worst pain is 10/10, Best: 2/10, Present: 2/10. No particular episode of trauma to his low back or hip preceding the pain. Pt has a history of Parkinson's with a deep brain stimulator. He ambulates with a 4 wheeled walker called a U-step (automatic brakes, audio cues for stepping). He states that he falls frequently and reports that he has had approximately 40-50 falls in the last 6 months. No specific recent changes in his health.     Currently in Pain?  Yes    Pain Score  3     Pain Location  Back    Pain Orientation  Mid    Pain Descriptors / Indicators  Aching    Pain Type  Acute pain    Pain Onset  Yesterday      RECERT  5x STS 15 seconds MODI: 42% BERG: 44/56  TUG: 15 seconds with RW  8-10 falls in past 2 weeks  left shoulder lean with right hip jut  Green theraband marches around knees 20x   Squat side step // bars 4x length of bars with GTB around ankles   When patient focuses on task balance has improved however when focus is divided patient has buckling of knees and LOB     Patient's condition has the potential to improve in response to therapy. Maximum improvement is yet to be obtained. The anticipated improvement is attainable and reasonable in a generally predictable time. Start date of reporting period 09/14/17 end date of reporting period 11/07/17. Patient reports that he feels like he is getting stronger and more steady.    [facility] PT Assessment - 11/07/17 0001      Berg Balance Test   Sit to Stand  Able to stand  independently using hands    Standing Unsupported  Able to stand 2 minutes with supervision    Sitting with Back Unsupported but Feet Supported on Floor or Stool  Able to sit safely and securely 2 minutes    Stand to Sit  Controls descent by using hands    Transfers  Able to transfer safely, definite need of hands    Standing Unsupported with Eyes Closed  Able to stand 10 seconds with supervision    Standing Ubsupported with  Feet Together  Able to place feet together independently and stand for 1 minute with supervision    From Standing, Reach Forward with Outstretched Arm  Can reach forward >12 cm safely (5")    From Standing Position, Pick up Object from Floor  Able to pick up shoe, needs supervision    From Standing Position, Turn to Look Behind Over each Shoulder  Looks behind from both sides and weight shifts well    Turn 360 Degrees  Able to turn 360 degrees safely one side only  in 4 seconds or less    Standing Unsupported, Alternately Place Feet on Step/Stool  Able to stand independently and complete 8 steps >20 seconds    Standing Unsupported, One Foot in Front  Able to plae foot ahead of the other independently and hold 30 seconds    Standing on One Leg  Able to lift leg independently and hold 5-10 seconds    Total Score  44                           PT Education - 11/07/17 1217    Education provided  Yes    Education Details  exercise technique, goals, POC    Person(s) Educated  Patient    Methods  Explanation;Demonstration;Verbal cues    Comprehension  Verbalized understanding;Returned demonstration       PT Short Term Goals - 11/07/17 1223      PT SHORT TERM GOAL #1   Title  Pt will perform HEP with family's supervision, for improved balance, transfers, and gait.      Baseline  patient reports compliance, however not every day    Time  4    Period  Weeks    Status  Partially Met    Target Date  12/05/17      PT SHORT TERM GOAL #2   Title  Pt will perform 5x sit<>stand in less than or equal to 15 seconds for improved efficiency and safety with transfers.    Baseline  19 seconds ; 15 seconds    Time  2    Period  Weeks    Status  Achieved        PT Long Term Goals - 11/07/17 1130      PT LONG TERM GOAL #1   Title  Pt will decrease 5TSTS by at least 3 seconds in order to demonstrate clinically significant improvement in LE strength     Baseline  02/08/17: 20.1  seconds 12/18: 17 seconds ; 09/14/17 =24.34 sec with assist due to poor static standing balance    Time  8    Period  Weeks    Status  Achieved      PT LONG TERM GOAL #2   Title  Pt will decrease TUG by at least 3.4s in order to demonstrate decreased fall risk     Baseline  02/08/17: 22.1s 12/18: 20 seconds 2/7: 19 seconds; 4/3: 23 seconds with walker, 09/14/17 15.12 sec 7/23: 15 seconds RW     Time  8    Period  Weeks    Status  Partially Met    Target Date  01/02/18      PT LONG TERM GOAL #3   Title  Pt will improve BERG by at least 3 points in order to demonstrate clinically significant improvement in balance.    Baseline  02/08/17: 30/56 12/18: 38/56      Time  8    Period  Weeks    Status  Achieved      PT LONG TERM GOAL #4   Title  Pt/wife will verbalize understanding of plans for continued community fitness upon D/C from PT including aquatic therapy program    Baseline  02/08/17: pt currently reports being more comfortable in the water    Time  8    Period  Weeks    Status  Partially Met    Target Date  01/02/18      PT LONG TERM GOAL #  5   Title  Pt will decrease mODI scoreby at least 13 points in order demonstrate clinically significant reduction in pain/disability     Baseline  02/08/17: 54% 12/18: 46% 2/7: 42% 4/3; 50%, 09/14/17 42% 7/23: 42%    Time  8    Period  Weeks    Status  Partially Met    Target Date  01/02/18      PT LONG TERM GOAL #6   Title  Pt will improve BERG to 44/56  in order to demonstrate clinically significant improvement in balance for decreased fall risk    Baseline  12/18: 38/56 2/7: 41/56 4/3: 41/56, 09/14/17  19/56 7/23: 44/56    Time  8    Period  Weeks    Status  Partially Met    Target Date  01/02/18            Plan - 11/07/17 1222    Clinical Impression Statement  Patient returning from stay at care facility while wife was on a vacation. Patient demonstrates ability to perform controlled movements with good balance when focused on  task, however upon divided  Attention patient has buckling of knees and LOB with erratic movement. Due to patient recent injury with fall and time away goals have not sufficiently progressed with exception of BERG.Patient's condition has the potential to improve in response to therapy. Maximum improvement is yet to be obtained. The anticipated improvement is attainable and reasonable in a generally predictable time Patient will continue to benefit from skilled physical therapy to improve static and dynamic standing balance and decrease fall risk for improved functional capacity of activities    Rehab Potential  Fair    PT Frequency  2x / week    PT Duration  8 weeks    PT Treatment/Interventions  ADLs/Self Care Home Management;Aquatic Therapy;Cryotherapy;Electrical Stimulation;Iontophoresis 22m/ml Dexamethasone;Moist Heat;Ultrasound;Contrast Bath;DME Instruction;Gait training;Stair training;Functional mobility training;Therapeutic activities;Therapeutic exercise;Balance training;Neuromuscular re-education;Patient/family education;Manual techniques;Wheelchair mobility training;Energy conservation;Passive range of motion    PT Next Visit Plan  progress balance, strength, aquatic therapy    PT Home Exercise Plan  None provided at this time until further assessed, wife reports pt [facility]    Consulted and Agree with Plan of Care  Patient       Patient will benefit from skilled therapeutic intervention in order to improve the following deficits and impairments:  Abnormal gait, Decreased balance, Decreased coordination, Decreased mobility, Impaired tone, Postural dysfunction  Visit Diagnosis: Muscle weakness (generalized)  Other lack of coordination  Other abnormalities of gait and mobility  Repeated falls     Problem List Patient Active Problem List   Diagnosis Date Noted  . Hematoma 10/18/2017  . Accidental fall 10/18/2017  . REM behavioral disorder 11/02/2016   . Radicular pain in right arm 10/09/2016  . Rash 09/03/2016  . GERD (gastroesophageal reflux disease) 07/31/2016  . Trochanteric bursitis 03/03/2016  . Left hand pain 10/30/2015  . Fracture, finger, multiple sites 10/30/2015  . Colon cancer screening 07/23/2015  . Healthcare maintenance 07/23/2015  . Gout 02/18/2015  . Depression 01/06/2015  . Irritation of eyelid 08/20/2014  . Dupuytren's contracture 08/20/2014  . Cough 07/21/2014  . Lumbar stenosis with neurogenic claudication 06/13/2014  . Preop cardiovascular exam 05/28/2014  . S/P deep brain stimulator placement 05/08/2014  . Joint pain 01/22/2014  . Medicare annual wellness visit, initial 01/19/2014  . Advance care planning 01/19/2014  . Parkinson's disease ([place] 12/13/2013  . PTSD (post-traumatic stress  disorder) 06/13/2013  . Erectile dysfunction 06/13/2013  . HLD (hyperlipidemia) 06/13/2013  . Hip pain 06/10/2013  . Pain in joint, shoulder region 06/10/2013  . Right leg swelling 06/03/2013  . Essential hypertension 06/03/2013  . Bradycardia by electrocardiogram 06/03/2013  . Obstructive sleep apnea 03/12/2013  . Spinal stenosis, lumbar region, with neurogenic claudication 12/14/2012   Janna Arch, PT, DPT   11/07/2017, 12:26 PM  [place] MAIN [facility] SERVICES [address] [place], [place], [address] Phone: [phone]   Fax:  [phone]  Name: KWEKU STANKEY MRN: [id number] Date of Birth: 1950-10-28

## 2017-11-09 ENCOUNTER — Ambulatory Visit: Payer: Medicare PPO

## 2017-11-09 ENCOUNTER — Other Ambulatory Visit: Payer: Self-pay

## 2017-11-09 DIAGNOSIS — R262 Difficulty in walking, not elsewhere classified: Secondary | ICD-10-CM

## 2017-11-09 DIAGNOSIS — R29818 Other symptoms and signs involving the nervous system: Secondary | ICD-10-CM

## 2017-11-09 DIAGNOSIS — R279 Unspecified lack of coordination: Secondary | ICD-10-CM | POA: Diagnosis not present

## 2017-11-09 DIAGNOSIS — G20 Parkinson's disease: Secondary | ICD-10-CM | POA: Diagnosis not present

## 2017-11-09 DIAGNOSIS — R278 Other lack of coordination: Secondary | ICD-10-CM

## 2017-11-09 DIAGNOSIS — R293 Abnormal posture: Secondary | ICD-10-CM | POA: Diagnosis not present

## 2017-11-09 DIAGNOSIS — R2681 Unsteadiness on feet: Secondary | ICD-10-CM | POA: Diagnosis not present

## 2017-11-09 DIAGNOSIS — M6281 Muscle weakness (generalized): Secondary | ICD-10-CM

## 2017-11-09 DIAGNOSIS — R296 Repeated falls: Secondary | ICD-10-CM

## 2017-11-09 DIAGNOSIS — R2689 Other abnormalities of gait and mobility: Secondary | ICD-10-CM | POA: Diagnosis not present

## 2017-11-09 DIAGNOSIS — R29898 Other symptoms and signs involving the musculoskeletal system: Secondary | ICD-10-CM | POA: Diagnosis not present

## 2017-11-09 NOTE — Therapy (Signed)
[place] MAIN [facility] SERVICES [address] [place], [place], [address] Phone: [phone]   Fax:  [phone]  Physical Therapy Treatment  Patient Details  Name: [name] MRN: [id number] Date of Birth: 28-Jul-1950 Referring Provider: Wells Guiles Tat   Encounter Date: 11/09/2017  PT End of Session - 11/09/17 1402    Visit Number  49    Number of Visits  49    Date for PT Re-Evaluation  01/02/18    PT Start Time  1030    PT Stop Time  1115    PT Time Calculation (min)  45 min    Activity Tolerance  Patient tolerated treatment well;Other (comment)    Behavior During Therapy  WFL for tasks assessed/performed       Past Medical History:  Diagnosis Date  . Arthritis   . Bradycardia   . Cancer [facility]) 2013   skin cancer  . Depression    ptsd  . Dysrhythmia    chronic slow heart rate  . GERD (gastroesophageal reflux disease)   . Headache(784.0)    tension headaches non recent  . History of chicken pox   . History of kidney stones    passed  . Hypertension    treated with HCTZ  . Parkinson's disease ([place])    dx'ed 15 years ago  . PTSD (post-traumatic stress disorder)   . Shortness of breath dyspnea   . Sleep apnea    doesn't use C-pap  . Varicose veins     Past Surgical History:  Procedure Laterality Date  . CHOLECYSTECTOMY N/A 10/22/2014   Procedure: LAPAROSCOPIC CHOLECYSTECTOMY WITH INTRAOPERATIVE CHOLANGIOGRAM;  Surgeon: Dia Crawford III, MD;  Location: [facility] ORS;  Service: General;  Laterality: N/A;  . cyst removed      from lip as a child  . LUMBAR LAMINECTOMY/DECOMPRESSION MICRODISCECTOMY Bilateral 12/14/2012   Procedure: Bilateral lumbar three-four, four-five decompressive laminotomy/foraminotomy;  Surgeon: Charlie Pitter, MD;  Location: [place] NEURO ORS;  Service: Neurosurgery;  Laterality: Bilateral;  . PULSE GENERATOR IMPLANT Bilateral 12/13/2013   Procedure: Bilateral implantable pulse generator placement;  Surgeon: Erline Levine,  MD;  Location: [place] NEURO ORS;  Service: Neurosurgery;  Laterality: Bilateral;  Bilateral implantable pulse generator placement  . skin cancer removed     from ears,   12 lft arm  rt leg 15  . SUBTHALAMIC STIMULATOR BATTERY REPLACEMENT Bilateral 07/14/2017   Procedure: BILATERAL IMPLANTED PULSE GENERATOR CHANGE FOR DEEP BRAIN STIMULATOR;  Surgeon: Erline Levine, MD;  Location: [place];  Service: Neurosurgery;  Laterality: Bilateral;  . SUBTHALAMIC STIMULATOR INSERTION Bilateral 12/06/2013   Procedure: SUBTHALAMIC STIMULATOR INSERTION;  Surgeon: Erline Levine, MD;  Location: [place] NEURO ORS;  Service: Neurosurgery;  Laterality: Bilateral;  Bilateral deep brain stimulator placement    There were no vitals filed for this visit.  Subjective Assessment - 11/09/17 1359    Subjective  Pt denies any recent injury due to falls. Pt notes having an off day today aftern starting session and therapist noting increased balance difficulties. Pt cannot further describe "just off" .     Patient is accompained by:  Family member    Pertinent History  Patient referred for physical therapy due to wife's request for patient to get aquatic therapy to help with his conditioning. Pt is also complaining of chronic low back and R hip pain and frequent falls. 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 The last  time he was in the water he was very uncomfortable because he didn't feel like he had any control. Pt has a history of 2 low back surgeries in August 2014 and February 2016. Pt and wife are unable to recall exactly what was performed but they know that he had stenosis. Surgeries were performed by Dr. Annette Stable in [place]. Pt reports that his low back and R hip pain are constant. He describes the pain in the back as "dull" and the pain in the hip as "sharp." For both back and hip worst pain is 10/10, Best: 2/10, Present: 2/10. No particular episode of trauma to his low back or hip  preceding the pain. Pt has a history of Parkinson's with a deep brain stimulator. He ambulates with a 4 wheeled walker called a U-step (automatic brakes, audio cues for stepping). He states that he falls frequently and reports that he has had approximately 40-50 falls in the last 6 months. No specific recent changes in his health.       Enters/exits via ramp with Min guard   Ambulation, blue dumbbells  Fwd 4 L (Min to Max A today)  Side step (ended up with 5# ankle wts) 3L ( Min to Mod A)  Side step with squat, 2 L (verbal cues for sequence and Min A)  Bench  Modified high plank (5#)   hip extension, 2 x 10 ea Seated  up and outs, 20x ea, B  STS without UE, 20x (min A as needed); increased verbal cues  Stand high knee march, Min to Mod A   Core with UE in wall sit  Blue dumbells, 10x ea   Triceps press downs   Sh abd/add   Sh flex/ext  1 additional fwd ambulation Min guard to [employer] A                                  PT Short Term Goals - 11/07/17 1223      PT SHORT TERM GOAL #1   Title  Pt will perform HEP with family's supervision, for improved balance, transfers, and gait.      Baseline  patient reports compliance, however not every day    Time  4    Period  Weeks    Status  Partially Met    Target Date  12/05/17      PT SHORT TERM GOAL #2   Title  Pt will perform 5x sit<>stand in less than or equal to 15 seconds for improved efficiency and safety with transfers.    Baseline  19 seconds ; 15 seconds    Time  2    Period  Weeks    Status  Achieved        PT Long Term Goals - 11/07/17 1130      PT LONG TERM GOAL #1   Title  Pt will decrease 5TSTS by at least 3 seconds in order to demonstrate clinically significant improvement in LE strength     Baseline  02/08/17: 20.1 seconds 12/18: 17 seconds ; 09/14/17 =24.34 sec with assist due to poor static standing balance    Time  8    Period  Weeks    Status  Achieved      PT LONG TERM  GOAL #2   Title  Pt will decrease TUG by at least 3.4s in order to demonstrate decreased fall risk     Baseline  02/08/17: 22.1s 12/18: 20 seconds 2/7: 19 seconds; 4/3: 23 seconds with walker, 09/14/17 15.12 sec 7/23: 15 seconds RW     Time  8    Period  Weeks    Status  Partially Met    Target Date  01/02/18      PT LONG TERM GOAL #3   Title  Pt will improve BERG by at least 3 points in order to demonstrate clinically significant improvement in balance.    Baseline  02/08/17: 30/56 12/18: 38/56      Time  8    Period  Weeks    Status  Achieved      PT LONG TERM GOAL #4   Title  Pt/wife will verbalize understanding of plans for continued community fitness upon D/C from PT including aquatic therapy program    Baseline  02/08/17: pt currently reports being more comfortable in the water    Time  8    Period  Weeks    Status  Partially Met    Target Date  01/02/18      PT LONG TERM GOAL #5   Title  Pt will decrease mODI scoreby at least 13 points in order demonstrate clinically significant reduction in pain/disability     Baseline  02/08/17: 54% 12/18: 46% 2/7: 42% 4/3; 50%, 09/14/17 42% 7/23: 42%    Time  8    Period  Weeks    Status  Partially Met    Target Date  01/02/18      PT LONG TERM GOAL #6   Title  Pt will improve BERG to 44/56  in order to demonstrate clinically significant improvement in balance for decreased fall risk    Baseline  12/18: 38/56 2/7: 41/56 4/3: 41/56, 09/14/17  19/56 7/23: 44/56    Time  8    Period  Weeks    Status  Partially Met    Target Date  01/02/18            Plan - 11/09/17 1402    Clinical Impression Statement  Pt noted to have a lot of difficulty with balance today most often losing balance posteriorly and to the L. Pt unable to correct without some level of assist varying from Min to Max A. Added up to 5# ankle wts with mild improvement, but continued difficulty greater than norm overall for walking activities. Greater focus then given to  strengthening later portion of session.     Rehab Potential  Fair    PT Frequency  2x / week    PT Duration  8 weeks    PT Treatment/Interventions  ADLs/Self Care Home Management;Aquatic Therapy;Cryotherapy;Electrical Stimulation;Iontophoresis '4mg'$ /ml Dexamethasone;Moist Heat;Ultrasound;Contrast Bath;DME Instruction;Gait training;Stair training;Functional mobility training;Therapeutic activities;Therapeutic exercise;Balance training;Neuromuscular re-education;Patient/family education;Manual techniques;Wheelchair mobility training;Energy conservation;Passive range of motion    PT Next Visit Plan  progress balance, strength, aquatic therapy    PT Home Exercise Plan  None provided at this time until further assessed, wife reports pt does not perform current HEP from [facility] PT    Consulted and Agree with Plan of Care  Patient       Patient will benefit from skilled therapeutic intervention in order to improve the following deficits and impairments:  Abnormal gait, Decreased balance, Decreased coordination, Decreased mobility, Impaired tone, Postural dysfunction  Visit Diagnosis: Muscle weakness (generalized)  Other lack of coordination  Other abnormalities of gait and mobility  Repeated falls  Unsteadiness on feet  Unspecified lack of coordination  Abnormal posture  Other symptoms  and signs involving the musculoskeletal system  Parkinson's disease ([place])  Difficulty walking  Other symptoms and signs involving the nervous system     Problem List Patient Active Problem List   Diagnosis Date Noted  . Hematoma 10/18/2017  . Accidental fall 10/18/2017  . 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 Radicular pain in right arm 10/09/2016  . Rash 09/03/2016  . GERD (gastroesophageal reflux disease) 07/31/2016  . Trochanteric bursitis 03/03/2016  . Left hand pain 10/30/2015  . Fracture, finger, multiple sites 10/30/2015  . Colon cancer screening 07/23/2015  . Healthcare maintenance  07/23/2015  . Gout 02/18/2015  . Depression 01/06/2015  . Irritation of eyelid 08/20/2014  . Dupuytren's contracture 08/20/2014  . Cough 07/21/2014  . Lumbar stenosis with neurogenic claudication 06/13/2014  . Preop cardiovascular exam 05/28/2014  . S/P deep brain stimulator placement 05/08/2014  . Joint pain 01/22/2014  . Medicare annual wellness visit, initial 01/19/2014  . Advance care planning 01/19/2014  . Parkinson's disease ([place]) 12/13/2013  . PTSD (post-traumatic stress disorder) 06/13/2013  . Erectile dysfunction 06/13/2013  . HLD (hyperlipidemia) 06/13/2013  . Hip pain 06/10/2013  . Pain in joint, shoulder region 06/10/2013  . Right leg swelling 06/03/2013  . Essential hypertension 06/03/2013  . Bradycardia by electrocardiogram 06/03/2013  . Obstructive sleep apnea 03/12/2013  . Spinal stenosis, lumbar region, with neurogenic claudication 12/14/2012    Larae Grooms 11/09/2017, 2:07 PM  [place], [place], [address] Phone: [phone]   Fax:  [phone]  Name: [name] MRN: [id number] Date of Birth: 10-25-1950

## 2017-11-14 ENCOUNTER — Ambulatory Visit: Payer: Medicare PPO | Admitting: Occupational Therapy

## 2017-11-14 ENCOUNTER — Ambulatory Visit: Payer: Medicare PPO

## 2017-11-14 ENCOUNTER — Encounter: Payer: Self-pay | Admitting: Occupational Therapy

## 2017-11-14 ENCOUNTER — Ambulatory Visit (INDEPENDENT_AMBULATORY_CARE_PROVIDER_SITE_OTHER): Payer: Medicare PPO | Admitting: Psychology

## 2017-11-14 DIAGNOSIS — R296 Repeated falls: Secondary | ICD-10-CM | POA: Diagnosis not present

## 2017-11-14 DIAGNOSIS — F4323 Adjustment disorder with mixed anxiety and depressed mood: Secondary | ICD-10-CM

## 2017-11-14 DIAGNOSIS — F331 Major depressive disorder, recurrent, moderate: Secondary | ICD-10-CM | POA: Diagnosis not present

## 2017-11-14 DIAGNOSIS — R278 Other lack of coordination: Secondary | ICD-10-CM

## 2017-11-14 DIAGNOSIS — R2689 Other abnormalities of gait and mobility: Secondary | ICD-10-CM | POA: Diagnosis not present

## 2017-11-14 DIAGNOSIS — R279 Unspecified lack of coordination: Secondary | ICD-10-CM | POA: Diagnosis not present

## 2017-11-14 DIAGNOSIS — M6281 Muscle weakness (generalized): Secondary | ICD-10-CM

## 2017-11-14 DIAGNOSIS — R29898 Other symptoms and signs involving the musculoskeletal system: Secondary | ICD-10-CM | POA: Diagnosis not present

## 2017-11-14 DIAGNOSIS — R2681 Unsteadiness on feet: Secondary | ICD-10-CM

## 2017-11-14 DIAGNOSIS — R293 Abnormal posture: Secondary | ICD-10-CM | POA: Diagnosis not present

## 2017-11-14 DIAGNOSIS — G20 Parkinson's disease: Secondary | ICD-10-CM | POA: Diagnosis not present

## 2017-11-14 NOTE — Therapy (Signed)
[place] MAIN [facility] SERVICES [address] [place], [place], [address] Phone: [phone]   Fax:  [phone]  Physical Therapy Treatment  Patient Details  Name: [name] MRN: [id number] Date of Birth: 25-Jul-1950 Referring Provider: Wells Guiles Tat   Encounter Date: 11/14/2017  PT End of Session - 11/14/17 1156    Visit Number  50    Number of Visits  65    Date for PT Re-Evaluation  01/02/18    Authorization Type  3/10 PN start 7/23    PT Start Time  1100    PT Stop Time  1145    PT Time Calculation (min)  45 min    Activity Tolerance  Patient tolerated treatment well;Other (comment)    Behavior During Therapy  WFL for tasks assessed/performed       Past Medical History:  Diagnosis Date  . Arthritis   . Bradycardia   . Cancer [facility]) 2013   skin cancer  . Depression    ptsd  . Dysrhythmia    chronic slow heart rate  . GERD (gastroesophageal reflux disease)   . Headache(784.0)    tension headaches non recent  . History of chicken pox   . History of kidney stones    passed  . Hypertension    treated with HCTZ  . Parkinson's disease ([place])    dx'ed 15 years ago  . PTSD (post-traumatic stress disorder)   . Shortness of breath dyspnea   . Sleep apnea    doesn't use C-pap  . Varicose veins     Past Surgical History:  Procedure Laterality Date  . CHOLECYSTECTOMY N/A 10/22/2014   Procedure: LAPAROSCOPIC CHOLECYSTECTOMY WITH INTRAOPERATIVE CHOLANGIOGRAM;  Surgeon: Dia Crawford III, MD;  Location: [facility] ORS;  Service: General;  Laterality: N/A;  . cyst removed      from lip as a child  . LUMBAR LAMINECTOMY/DECOMPRESSION MICRODISCECTOMY Bilateral 12/14/2012   Procedure: Bilateral lumbar three-four, four-five decompressive laminotomy/foraminotomy;  Surgeon: Charlie Pitter, MD;  Location: [place] NEURO ORS;  Service: Neurosurgery;  Laterality: Bilateral;  . PULSE GENERATOR IMPLANT Bilateral 12/13/2013   Procedure: Bilateral implantable pulse  generator placement;  Surgeon: Erline Levine, MD;  Location: [place] NEURO ORS;  Service: Neurosurgery;  Laterality: Bilateral;  Bilateral implantable pulse generator placement  . skin cancer removed     from ears,   12 lft arm  rt leg 15  . SUBTHALAMIC STIMULATOR BATTERY REPLACEMENT Bilateral 07/14/2017   Procedure: BILATERAL IMPLANTED PULSE GENERATOR CHANGE FOR DEEP BRAIN STIMULATOR;  Surgeon: Erline Levine, MD;  Location: [place];  Service: Neurosurgery;  Laterality: Bilateral;  . SUBTHALAMIC STIMULATOR INSERTION Bilateral 12/06/2013   Procedure: SUBTHALAMIC STIMULATOR INSERTION;  Surgeon: Erline Levine, MD;  Location: [place] NEURO ORS;  Service: Neurosurgery;  Laterality: Bilateral;  Bilateral deep brain stimulator placement    There were no vitals filed for this visit.  Subjective Assessment - 11/14/17 1155    Subjective  Patient arrived with wife and new aide. Reports no new injuries from falls but does report having a new right lean as of a day ago with occasional soreness in R hip with weightbearing.     Patient is accompained by:  Family member    Pertinent History  Patient referred for physical therapy due to wife's request for patient to get aquatic therapy to help with his conditioning. Pt is also complaining of chronic low back and R hip pain and frequent falls. 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 The last time he was in the water he was very uncomfortable because he didn't feel like he had any control. Pt has a history of 2 low back surgeries in August 2014 and February 2016. Pt and wife are unable to recall exactly what was performed but they know that he had stenosis. Surgeries were performed by Dr. Pool in . Pt reports that his low back and R hip pain are constant. He describes the pain in the back as "dull" and the pain in the hip as "sharp." For both back and hip worst pain is 10/10, Best: 2/10, Present: 2/10. No particular  episode of trauma to his low back or hip preceding the pain. Pt has a history of Parkinson's with a deep brain stimulator. He ambulates with a 4 wheeled walker called a U-step (automatic brakes, audio cues for stepping). He states that he falls frequently and reports that he has had approximately 40-50 falls in the last 6 months. No specific recent changes in his health.     Currently in Pain?  Yes    Pain Score  3     Pain Location  Hip    Pain Orientation  Right    Pain Descriptors / Indicators  Aching    Pain Type  Chronic pain    Pain Onset  More than a month ago    Pain Frequency  Intermittent    Aggravating Factors   weightbearing      Nustep L5, 3 minutes for warm-up (unbilled); LE only      -Standing marching 5# ankle weight x 15; 2 sets  bilateral, BUE support; Frequent forward momentum requiring cueing for upright posture and to bring legs in line with trunk and lift head up.    -Standing abduction 5# ankle weight x 12 bilateral, BUE support; 2 sets : L knee difficulty with straightening legs     -Standing hip extension 5# ankle weight x 15 bilateral, BUE support; cues for keeping leg straight and trunk upright.       -seated LAQ  5lb ankle weights 10x each leg 3 second holds    -seated adduction squeezes 10x 3 second hold  -GTB seated abduction one leg at a time 10x each leg,    Neuromuscular Re-education   6" step step-ups alternating LE x 20 each, verbal cues for L and R for pace. widening BOS, and accuracy of tap   -Airex pad balance with feet apart eyes closed 5 x 30s, ; tactile cues to abdomen and chest for placement      Pt. response to medical necessity: Patient will continue to benefit from skilled physical therapy to improve static and dynamic standing balance and decrease fall risk for improved functional capacity of activities                          PT Education - 11/14/17 1156    Education provided  Yes    Education Details   exercise technique, stability, tactile cueing for mantaining COM    Person(s) Educated  Patient;Caregiver(s)    Methods  Explanation;Demonstration;Tactile cues;Verbal cues    Comprehension  Verbalized understanding;Returned demonstration;Verbal cues required;Tactile cues required       PT Short Term Goals - 11/07/17 1223      PT SHORT TERM GOAL #1   Title  Pt will perform HEP with family's supervision, for improved balance, transfers, and   gait.      Baseline  patient reports compliance, however not every day    Time  4    Period  Weeks    Status  Partially Met    Target Date  12/05/17      PT SHORT TERM GOAL #2   Title  Pt will perform 5x sit<>stand in less than or equal to 15 seconds for improved efficiency and safety with transfers.    Baseline  19 seconds ; 15 seconds    Time  2    Period  Weeks    Status  Achieved        PT Long Term Goals - 11/07/17 1130      PT LONG TERM GOAL #1   Title  Pt will decrease 5TSTS by at least 3 seconds in order to demonstrate clinically significant improvement in LE strength     Baseline  02/08/17: 20.1 seconds 12/18: 17 seconds ; 09/14/17 =24.34 sec with assist due to poor static standing balance    Time  8    Period  Weeks    Status  Achieved      PT LONG TERM GOAL #2   Title  Pt will decrease TUG by at least 3.4s in order to demonstrate decreased fall risk     Baseline  02/08/17: 22.1s 12/18: 20 seconds 2/7: 19 seconds; 4/3: 23 seconds with walker, 09/14/17 15.12 sec 7/23: 15 seconds RW     Time  8    Period  Weeks    Status  Partially Met    Target Date  01/02/18      PT LONG TERM GOAL #3   Title  Pt will improve BERG by at least 3 points in order to demonstrate clinically significant improvement in balance.    Baseline  02/08/17: 30/56 12/18: 38/56      Time  8    Period  Weeks    Status  Achieved      PT LONG TERM GOAL #4   Title  Pt/wife will verbalize understanding of plans for continued community fitness upon D/C from PT  including aquatic therapy program    Baseline  02/08/17: pt currently reports being more comfortable in the water    Time  8    Period  Weeks    Status  Partially Met    Target Date  01/02/18      PT LONG TERM GOAL #5   Title  Pt will decrease mODI scoreby at least 13 points in order demonstrate clinically significant reduction in pain/disability     Baseline  02/08/17: 54% 12/18: 46% 2/7: 42% 4/3; 50%, 09/14/17 42% 7/23: 42%    Time  8    Period  Weeks    Status  Partially Met    Target Date  01/02/18      PT LONG TERM GOAL #6   Title  Pt will improve BERG to 44/56  in order to demonstrate clinically significant improvement in balance for decreased fall risk    Baseline  12/18: 38/56 2/7: 41/56 4/3: 41/56, 09/14/17  19/56 7/23: 44/56    Time  8    Period  Weeks    Status  Partially Met    Target Date  01/02/18            Plan - 11/14/17 1158    Clinical Impression Statement  Patient demonstrated increased right lean today with more frequent left knee buckling. This is a noted difference from   his usual left lean with the left knee buckling. Caregiver/aide was present to learn more about home exercises and how to cue patient for safe mobility.Patient will continue to benefit from skilled physical therapy to improve static and dynamic standing balance and decrease fall risk for improved functional capacity of activities     Rehab Potential  Fair    PT Frequency  2x / week    PT Duration  8 weeks    PT Treatment/Interventions  ADLs/Self Care Home Management;Aquatic Therapy;Cryotherapy;Electrical Stimulation;Iontophoresis 4mg/ml Dexamethasone;Moist Heat;Ultrasound;Contrast Bath;DME Instruction;Gait training;Stair training;Functional mobility training;Therapeutic activities;Therapeutic exercise;Balance training;Neuromuscular re-education;Patient/family education;Manual techniques;Wheelchair mobility training;Energy conservation;Passive range of motion    PT Next Visit Plan  progress  balance, strength, aquatic therapy    PT Home Exercise Plan  None provided at this time until further assessed, wife reports pt does not perform current HEP from HH PT    Consulted and Agree with Plan of Care  Patient       Patient will benefit from skilled therapeutic intervention in order to improve the following deficits and impairments:  Abnormal gait, Decreased balance, Decreased coordination, Decreased mobility, Impaired tone, Postural dysfunction  Visit Diagnosis: Muscle weakness (generalized)  Other lack of coordination  Other abnormalities of gait and mobility  Repeated falls  Unsteadiness on feet     Problem List Patient Active Problem List   Diagnosis Date Noted  . Hematoma 10/18/2017  . Accidental fall 10/18/2017  . 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 Radicular pain in right arm 10/09/2016  . Rash 09/03/2016  . GERD (gastroesophageal reflux disease) 07/31/2016  . Trochanteric bursitis 03/03/2016  . Left hand pain 10/30/2015  . Fracture, finger, multiple sites 10/30/2015  . Colon cancer screening 07/23/2015  . Healthcare maintenance 07/23/2015  . Gout 02/18/2015  . Depression 01/06/2015  . Irritation of eyelid 08/20/2014  . Dupuytren's contracture 08/20/2014  . Cough 07/21/2014  . Lumbar stenosis with neurogenic claudication 06/13/2014  . Preop cardiovascular exam 05/28/2014  . S/P deep brain stimulator placement 05/08/2014  . Joint pain 01/22/2014  . Medicare annual wellness visit, initial 01/19/2014  . Advance care planning 01/19/2014  . Parkinson's disease ([facility]) 12/13/2013  . PTSD (post-traumatic stress disorder) 06/13/2013  . Erectile dysfunction 06/13/2013  . HLD (hyperlipidemia) 06/13/2013  . Hip pain 06/10/2013  . Pain in joint, shoulder region 06/10/2013  . Right leg swelling 06/03/2013  . Essential hypertension 06/03/2013  . Bradycardia by electrocardiogram 06/03/2013  . Obstructive sleep apnea 03/12/2013  . Spinal stenosis, lumbar  region, with neurogenic claudication 12/14/2012    , PT, DPT   11/14/2017, 11:59 AM  [place] [facility] MAIN REHAB SERVICES [address] [place], [place], [address] Phone: [phone]   Fax:  [phone]  Name: Deagen D Thaden MRN: [id number] Date of Birth: 02/22/1951   

## 2017-11-14 NOTE — Therapy (Signed)
[place] MAIN [facility] SERVICES [address] [place], [place], [address] Phone: [phone]   Fax:  [phone]  Occupational Therapy Progress Note  Dates of reporting period  07/31/2017  to 11/14/2017  Patient Details  Name: [name] MRN: [id number] Date of Birth: [birth year] Referring Provider: Wells Guiles Tat   Encounter Date: 11/14/2017  OT End of Session - 11/14/17 1202    Visit Number  19    Number of Visits  24    Date for OT Re-Evaluation  01/16/18    Authorization Type  Visit 9 of 10 for progress reporting period starting 07/31/2017    OT Start Time  1145    OT Stop Time  1230    OT Time Calculation (min)  45 min    Activity Tolerance  Patient tolerated treatment well    Behavior During Therapy  [facility] for tasks assessed/performed       Past Medical History:  Diagnosis Date  . Arthritis   . Bradycardia   . Cancer [facility]) 2013   skin cancer  . Depression    ptsd  . Dysrhythmia    chronic slow heart rate  . GERD (gastroesophageal reflux disease)   . Headache(784.0)    tension headaches non recent  . History of chicken pox   . History of kidney stones    passed  . Hypertension    treated with HCTZ  . Parkinson's disease ([place])    dx'ed 15 years ago  . PTSD (post-traumatic stress disorder)   . Shortness of breath dyspnea   . Sleep apnea    doesn't use C-pap  . Varicose veins     Past Surgical History:  Procedure Laterality Date  . CHOLECYSTECTOMY N/A 10/22/2014   Procedure: LAPAROSCOPIC CHOLECYSTECTOMY WITH INTRAOPERATIVE CHOLANGIOGRAM;  Surgeon: Dia Crawford III, MD;  Location: [facility] ORS;  Service: General;  Laterality: N/A;  . cyst removed      from lip as a child  . LUMBAR LAMINECTOMY/DECOMPRESSION MICRODISCECTOMY Bilateral 12/14/2012   Procedure: Bilateral lumbar three-four, four-five decompressive laminotomy/foraminotomy;  Surgeon: Charlie Pitter, MD;  Location: [place] NEURO ORS;  Service: Neurosurgery;  Laterality: Bilateral;   . PULSE GENERATOR IMPLANT Bilateral 12/13/2013   Procedure: Bilateral implantable pulse generator placement;  Surgeon: Erline Levine, MD;  Location: [place] NEURO ORS;  Service: Neurosurgery;  Laterality: Bilateral;  Bilateral implantable pulse generator placement  . skin cancer removed     from ears,   12 lft arm  rt leg 15  . SUBTHALAMIC STIMULATOR BATTERY REPLACEMENT Bilateral 07/14/2017   Procedure: BILATERAL IMPLANTED PULSE GENERATOR CHANGE FOR DEEP BRAIN STIMULATOR;  Surgeon: Erline Levine, MD;  Location: [place];  Service: Neurosurgery;  Laterality: Bilateral;  . SUBTHALAMIC STIMULATOR INSERTION Bilateral 12/06/2013   Procedure: SUBTHALAMIC STIMULATOR INSERTION;  Surgeon: Erline Levine, MD;  Location: [place] NEURO ORS;  Service: Neurosurgery;  Laterality: Bilateral;  Bilateral deep brain stimulator placement    There were no vitals filed for this visit.  Subjective Assessment - 11/14/17 1201    Subjective   Pt. reports he fell out of a boat that last time he went fishing.    Patient is accompained by:  Family member    Pertinent History  Pt. is a 67 y.o. mlae who has a history of Parkinson's Disease. Pt. has a Deep Brain Stimulator in place. Pt. has a history of Low Back Pain with 2 back surgeries in August 2014, and February 2016. Pt. has a history of multiple frequent falls  occurring at a rate of 6x's a week.     Limitations  Frequent falls, limited motor control, and [place].     Patient Stated Goals  To return home.    Currently in Pain?  Yes    Pain Score  3     Pain Orientation  Right    Pain Descriptors / Indicators  Aching    Pain Onset  More than a month ago      OT TREATMENT    Neuro muscular re-education:  Pt. performed [facility] skills training to improve speed and dexterity needed for ADL tasks and writing. Pt. demonstrated grasping 1 inch sticks,  inch cylindrical collars, and  inch flat washers on the Purdue pegboard. Pt. performed grasping each item with her 2nd digit and thumb, and  storing them in the palm. Pt. presented with difficulty storing  inch objects at a time in the palmar aspect of the hand.  Therapeutic Exercise:  Pt. worked with 2# dumbbell ex. for elbow flexion and extension, forearm supination/pronation, wrist flexion/extension, and radial deviation. Pt. requires rest breaks and verbal cues for proper technique. Pt. Was provided with a HEP through [place].                          OT Education - 11/14/17 1202    Education provided  Yes    Education Details  [facility], HEP for UE ther ex.    Person(s) Educated  Patient    Methods  Demonstration;Verbal cues;Explanation    Comprehension  Verbal cues required;Verbalized understanding          OT Long Term Goals - 11/14/17 1642      OT LONG TERM GOAL #1   Title  Pt. will improve Bilateral UE strength by 1 mm grade to assist with ADLs, and IADLs    Baseline  BUE strength: 4+/5 overall    Time  12    Period  Weeks    Status  On-going    Target Date  01/16/18      OT LONG TERM GOAL #2   Title  Pt. will improve grip strength to be able to pour himself a beverage.    Baseline  Pt. continues to be unable to hold and pour a beevrage.    Time  12    Period  Weeks    Status  On-going    Target Date  01/16/18      OT LONG TERM GOAL #3   Title  Pt. will improve lateral pinch stength to be able to independently turn a key.     Baseline  Pt. continues to have difficulty holding, and turning  key.    Time  12    Period  Weeks    Status  On-going    Target Date  01/16/18      OT LONG TERM GOAL #4   Title  Pt. will improve Right hand [facility] skills to be able to independently grasp and use small objects needed for fixing car parts.    Baseline   45sec. on the 9 hole peg test.    Time  12    Period  Weeks    Status  On-going    Target Date  01/16/18      OT LONG TERM GOAL #5   Title  Pt. will be demonstarte self-feeding with modified independence with minimal spillage.    Baseline   Pt. continues to require assistance, and  has alot of spillage.    Time  12    Period  Weeks    Status  On-going    Target Date  01/16/18      OT LONG TERM GOAL #6   Title  Pt. will manipulate buttons, and cuff buttons independently.    Baseline   Pt. has difficulty manipulating buttons    Time  12    Period  Weeks    Status  On-going    Target Date  01/16/18      OT LONG TERM GOAL #7   Title  Pt. will write 1 sentence efficiently with 90% legibility.    Baseline  name only 75% legibility    Time  12    Period  Weeks    Status  On-going    Target Date  01/16/18      OT LONG TERM GOAL #8   Title  Pt. will improve typing speed, and accuracy to be able to to type emails efficently    Baseline  Typing speed: 6 wpm, Accuracy: 33% with moderate cues for hand placement    Time  12    Period  Weeks    Status  On-going    Target Date  01/16/18            Plan - 11/14/17 1203    Clinical Impression Statement Pt. has been approved for a personal homecare aide through the [place]. Pt.'s aide was present during therapy. The hours mostly will be 1-5pm. They will be increasing the hours.  Pt. continues to work on improving [facility], prehension, and dexterity skills, and UE strength skills for improved ADLs, and IADL skills. Pt. was provided with a HEP for BUEs. with a visual handout.  Goals were reviewed.     Occupational performance deficits (Please refer to evaluation for details):  ADL's;IADL's    Rehab Potential  Good    Current Impairments/barriers affecting progress:  Positive Indicators: age, familiy support, motivation. Negative Indicators: Multiple comorbidities, and history of falls.    OT Frequency  2x / week    OT Duration  12 weeks    OT Treatment/Interventions  Self-care/ADL training;Therapeutic exercise;DME and/or AE instruction;Energy conservation;Neuromuscular education;Patient/family education;Therapeutic activities    Clinical Decision Making  Several treatment options, min-mod  task modification necessary    OT Home Exercise Plan  HEP for using rubber bands for strengthening at home and work on more handwriting.    Consulted and Agree with Plan of Care  Patient       Patient will benefit from skilled therapeutic intervention in order to improve the following deficits and impairments:  Pain, Decreased strength, Decreased coordination, Decreased balance, Impaired UE functional use, Decreased safety awareness, Decreased activity tolerance, Decreased cognition, Impaired flexibility, Impaired vision/preception  Visit Diagnosis: Muscle weakness (generalized)  Other lack of coordination    Problem List Patient Active Problem List   Diagnosis Date Noted  . Hematoma 10/18/2017  . Accidental fall 10/18/2017  . 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 Radicular pain in right arm 10/09/2016  . Rash 09/03/2016  . GERD (gastroesophageal reflux disease) 07/31/2016  . Trochanteric bursitis 03/03/2016  . Left hand pain 10/30/2015  . Fracture, finger, multiple sites 10/30/2015  . Colon cancer screening 07/23/2015  . Healthcare maintenance 07/23/2015  . Gout 02/18/2015  . Depression 01/06/2015  . Irritation of eyelid 08/20/2014  . Dupuytren's contracture 08/20/2014  . Cough 07/21/2014  . Lumbar stenosis with neurogenic claudication 06/13/2014  . Preop cardiovascular exam  05/28/2014  . S/P deep brain stimulator placement 05/08/2014  . Joint pain 01/22/2014  . Medicare annual wellness visit, initial 01/19/2014  . Advance care planning 01/19/2014  . Parkinson's disease ([place]) 12/13/2013  . PTSD (post-traumatic stress disorder) 06/13/2013  . Erectile dysfunction 06/13/2013  . HLD (hyperlipidemia) 06/13/2013  . Hip pain 06/10/2013  . Pain in joint, shoulder region 06/10/2013  . Right leg swelling 06/03/2013  . Essential hypertension 06/03/2013  . Bradycardia by electrocardiogram 06/03/2013  . Obstructive sleep apnea 03/12/2013  . Spinal stenosis, lumbar region, with  neurogenic claudication 12/14/2012    Harrel Carina, MS, OTR/L 11/14/2017, 4:45 PM  [place] MAIN [facility] SERVICES [address] [place], [place], [address] Phone: [phone]   Fax:  [phone]  Name: DEANTA MINCEY MRN: [id number] Date of Birth: 1951-04-13

## 2017-11-16 ENCOUNTER — Ambulatory Visit: Payer: Medicare PPO | Attending: Neurology

## 2017-11-16 ENCOUNTER — Other Ambulatory Visit: Payer: Self-pay

## 2017-11-16 DIAGNOSIS — R2681 Unsteadiness on feet: Secondary | ICD-10-CM | POA: Diagnosis not present

## 2017-11-16 DIAGNOSIS — M6281 Muscle weakness (generalized): Secondary | ICD-10-CM

## 2017-11-16 DIAGNOSIS — G20 Parkinson's disease: Secondary | ICD-10-CM | POA: Diagnosis present

## 2017-11-16 DIAGNOSIS — G20A1 Parkinson's disease without dyskinesia, without mention of fluctuations: Secondary | ICD-10-CM

## 2017-11-16 DIAGNOSIS — R29818 Other symptoms and signs involving the nervous system: Secondary | ICD-10-CM | POA: Diagnosis not present

## 2017-11-16 DIAGNOSIS — R2689 Other abnormalities of gait and mobility: Secondary | ICD-10-CM | POA: Diagnosis not present

## 2017-11-16 DIAGNOSIS — R278 Other lack of coordination: Secondary | ICD-10-CM | POA: Diagnosis not present

## 2017-11-16 DIAGNOSIS — R262 Difficulty in walking, not elsewhere classified: Secondary | ICD-10-CM

## 2017-11-16 DIAGNOSIS — R293 Abnormal posture: Secondary | ICD-10-CM

## 2017-11-16 DIAGNOSIS — R29898 Other symptoms and signs involving the musculoskeletal system: Secondary | ICD-10-CM | POA: Diagnosis present

## 2017-11-16 DIAGNOSIS — R296 Repeated falls: Secondary | ICD-10-CM

## 2017-11-16 DIAGNOSIS — R279 Unspecified lack of coordination: Secondary | ICD-10-CM | POA: Diagnosis not present

## 2017-11-16 NOTE — Therapy (Signed)
[place] MAIN [facility] SERVICES [address] [place], [place], [address] Phone: [phone]   Fax:  [phone]  Physical Therapy Treatment  Patient Details  Name: [name] MRN: [id number] Date of Birth: 21-Jul-1950 Referring Provider: Wells Guiles Tat   Encounter Date: 11/16/2017  PT End of Session - 11/16/17 1636    Visit Number  51    Number of Visits  65    Date for PT Re-Evaluation  01/02/18    PT Start Time  0900    PT Stop Time  0945    PT Time Calculation (min)  45 min    Activity Tolerance  Patient tolerated treatment well;Other (comment)    Behavior During Therapy  WFL for tasks assessed/performed       Past Medical History:  Diagnosis Date  . Arthritis   . Bradycardia   . Cancer [facility]) 2013   skin cancer  . Depression    ptsd  . Dysrhythmia    chronic slow heart rate  . GERD (gastroesophageal reflux disease)   . Headache(784.0)    tension headaches non recent  . History of chicken pox   . History of kidney stones    passed  . Hypertension    treated with HCTZ  . Parkinson's disease ([place])    dx'ed 15 years ago  . PTSD (post-traumatic stress disorder)   . Shortness of breath dyspnea   . Sleep apnea    doesn't use C-pap  . Varicose veins     Past Surgical History:  Procedure Laterality Date  . CHOLECYSTECTOMY N/A 10/22/2014   Procedure: LAPAROSCOPIC CHOLECYSTECTOMY WITH INTRAOPERATIVE CHOLANGIOGRAM;  Surgeon: Dia Crawford III, MD;  Location: [facility] ORS;  Service: General;  Laterality: N/A;  . cyst removed      from lip as a child  . LUMBAR LAMINECTOMY/DECOMPRESSION MICRODISCECTOMY Bilateral 12/14/2012   Procedure: Bilateral lumbar three-four, four-five decompressive laminotomy/foraminotomy;  Surgeon: Charlie Pitter, MD;  Location: [place] NEURO ORS;  Service: Neurosurgery;  Laterality: Bilateral;  . PULSE GENERATOR IMPLANT Bilateral 12/13/2013   Procedure: Bilateral implantable pulse generator placement;  Surgeon: Erline Levine,  MD;  Location: [place] NEURO ORS;  Service: Neurosurgery;  Laterality: Bilateral;  Bilateral implantable pulse generator placement  . skin cancer removed     from ears,   12 lft arm  rt leg 15  . SUBTHALAMIC STIMULATOR BATTERY REPLACEMENT Bilateral 07/14/2017   Procedure: BILATERAL IMPLANTED PULSE GENERATOR CHANGE FOR DEEP BRAIN STIMULATOR;  Surgeon: Erline Levine, MD;  Location: [place];  Service: Neurosurgery;  Laterality: Bilateral;  . SUBTHALAMIC STIMULATOR INSERTION Bilateral 12/06/2013   Procedure: SUBTHALAMIC STIMULATOR INSERTION;  Surgeon: Erline Levine, MD;  Location: [place] NEURO ORS;  Service: Neurosurgery;  Laterality: Bilateral;  Bilateral deep brain stimulator placement    There were no vitals filed for this visit.  Subjective Assessment - 11/16/17 1633    Subjective  Pt reports no significant falls/injury; does overall feel balance has been a little worse on most days. Spouse reports having a new assistant 5 days a week for several hours a day.     Patient is accompained by:  Family member    Pertinent History  Patient referred for physical therapy due to wife's request for patient to get aquatic therapy to help with his conditioning. Pt is also complaining of chronic low back and R hip pain and frequent falls. 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 The  last time he was in the water he was very uncomfortable because he didn't feel like he had any control. Pt has a history of 2 low back surgeries in August 2014 and February 2016. Pt and wife are unable to recall exactly what was performed but they know that he had stenosis. Surgeries were performed by Dr. Annette Stable in [place]. Pt reports that his low back and R hip pain are constant. He describes the pain in the back as "dull" and the pain in the hip as "sharp." For both back and hip worst pain is 10/10, Best: 2/10, Present: 2/10. No particular episode of trauma to his low back or hip preceding  the pain. Pt has a history of Parkinson's with a deep brain stimulator. He ambulates with a 4 wheeled walker called a U-step (automatic brakes, audio cues for stepping). He states that he falls frequently and reports that he has had approximately 40-50 falls in the last 6 months. No specific recent changes in his health.       Enters exits via ramp   3# wts on ankle throughout session for grounding.  Ambulation, blue dumbbells  4 L fwd  4 L side  4 L bkwd  2 L side with squat  4 L high knee march  Mod plank at bench  Coordinated UE/LE walk (maintain plank) 2L  Hip ext, 10x ea  Bench  STS without use UE (maintain feet hip width, not further)                        PT Education - 11/16/17 1635    Education provided  Yes    Education Details  Using 3# wts for grounding, but not too heavy to make moving legs through the water difficult.     Person(s) Educated  Patient    Methods  Explanation    Comprehension  Verbalized understanding       PT Short Term Goals - 11/07/17 1223      PT SHORT TERM GOAL #1   Title  Pt will perform HEP with family's supervision, for improved balance, transfers, and gait.      Baseline  patient reports compliance, however not every day    Time  4    Period  Weeks    Status  Partially Met    Target Date  12/05/17      PT SHORT TERM GOAL #2   Title  Pt will perform 5x sit<>stand in less than or equal to 15 seconds for improved efficiency and safety with transfers.    Baseline  19 seconds ; 15 seconds    Time  2    Period  Weeks    Status  Achieved        PT Long Term Goals - 11/07/17 1130      PT LONG TERM GOAL #1   Title  Pt will decrease 5TSTS by at least 3 seconds in order to demonstrate clinically significant improvement in LE strength     Baseline  02/08/17: 20.1 seconds 12/18: 17 seconds ; 09/14/17 =24.34 sec with assist due to poor static standing balance    Time  8    Period  Weeks    Status  Achieved      PT  LONG TERM GOAL #2   Title  Pt will decrease TUG by at least 3.4s in order to demonstrate decreased fall risk     Baseline  02/08/17: 22.1s 12/18: 20  seconds 2/7: 19 seconds; 4/3: 23 seconds with walker, 09/14/17 15.12 sec 7/23: 15 seconds RW     Time  8    Period  Weeks    Status  Partially Met    Target Date  01/02/18      PT LONG TERM GOAL #3   Title  Pt will improve BERG by at least 3 points in order to demonstrate clinically significant improvement in balance.    Baseline  02/08/17: 30/56 12/18: 38/56      Time  8    Period  Weeks    Status  Achieved      PT LONG TERM GOAL #4   Title  Pt/wife will verbalize understanding of plans for continued community fitness upon D/C from PT including aquatic therapy program    Baseline  02/08/17: pt currently reports being more comfortable in the water    Time  8    Period  Weeks    Status  Partially Met    Target Date  01/02/18      PT LONG TERM GOAL #5   Title  Pt will decrease mODI scoreby at least 13 points in order demonstrate clinically significant reduction in pain/disability     Baseline  02/08/17: 54% 12/18: 46% 2/7: 42% 4/3; 50%, 09/14/17 42% 7/23: 42%    Time  8    Period  Weeks    Status  Partially Met    Target Date  01/02/18      PT LONG TERM GOAL #6   Title  Pt will improve BERG to 44/56  in order to demonstrate clinically significant improvement in balance for decreased fall risk    Baseline  12/18: 38/56 2/7: 41/56 4/3: 41/56, 09/14/17  19/56 7/23: 44/56    Time  8    Period  Weeks    Status  Partially Met    Target Date  01/02/18            Plan - 11/16/17 1636    Clinical Impression Statement  Pt performance regarding balance improved with 3# ankle wts demonstrating ability to manage balance activities with decreased assistance compared to without wts. Added coordinated UE/LE walk in modified plank with good tolerance, Requires intermittent cues for sequence with higher level coordinated activities such as this  and side step with squats. Contine to improve strength and balance.     Rehab Potential  Fair    PT Frequency  2x / week    PT Duration  8 weeks    PT Treatment/Interventions  ADLs/Self Care Home Management;Aquatic Therapy;Cryotherapy;Electrical Stimulation;Iontophoresis 44m/ml Dexamethasone;Moist Heat;Ultrasound;Contrast Bath;DME Instruction;Gait training;Stair training;Functional mobility training;Therapeutic activities;Therapeutic exercise;Balance training;Neuromuscular re-education;Patient/family education;Manual techniques;Wheelchair mobility training;Energy conservation;Passive range of motion    PT Next Visit Plan  progress balance, strength, aquatic therapy    PT Home Exercise Plan  None provided at this time until further assessed, wife reports pt does not perform [facility]    Consulted and Agree with Plan of Care  Patient       Patient will benefit from skilled therapeutic intervention in order to improve the following deficits and impairments:  Abnormal gait, Decreased balance, Decreased coordination, Decreased mobility, Impaired tone, Postural dysfunction  Visit Diagnosis: Muscle weakness (generalized)  Other lack of coordination  Other abnormalities of gait and mobility  Repeated falls  Unsteadiness on feet  Unspecified lack of coordination  Abnormal posture  Other symptoms and signs involving the nervous system  Difficulty walking  Parkinson's disease (  [place])  Other symptoms and signs involving the musculoskeletal system     Problem List Patient Active Problem List   Diagnosis Date Noted  . Hematoma 10/18/2017  . Accidental fall 10/18/2017  . 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 Radicular pain in right arm 10/09/2016  . Rash 09/03/2016  . GERD (gastroesophageal reflux disease) 07/31/2016  . Trochanteric bursitis 03/03/2016  . Left hand pain 10/30/2015  . Fracture, finger, multiple sites 10/30/2015  . Colon cancer screening 07/23/2015  .  Healthcare maintenance 07/23/2015  . Gout 02/18/2015  . Depression 01/06/2015  . Irritation of eyelid 08/20/2014  . Dupuytren's contracture 08/20/2014  . Cough 07/21/2014  . Lumbar stenosis with neurogenic claudication 06/13/2014  . Preop cardiovascular exam 05/28/2014  . S/P deep brain stimulator placement 05/08/2014  . Joint pain 01/22/2014  . Medicare annual wellness visit, initial 01/19/2014  . Advance care planning 01/19/2014  . Parkinson's disease ([place]) 12/13/2013  . PTSD (post-traumatic stress disorder) 06/13/2013  . Erectile dysfunction 06/13/2013  . HLD (hyperlipidemia) 06/13/2013  . Hip pain 06/10/2013  . Pain in joint, shoulder region 06/10/2013  . Right leg swelling 06/03/2013  . Essential hypertension 06/03/2013  . Bradycardia by electrocardiogram 06/03/2013  . Obstructive sleep apnea 03/12/2013  . Spinal stenosis, lumbar region, with neurogenic claudication 12/14/2012    Larae Grooms 11/16/2017, 4:40 PM  [place] MAIN [facility] SERVICES [place], [place], [address] Phone: [phone]   Fax:  [phone]  Name: [name] MRN: [id number] Date of Birth: [birth year]

## 2017-11-21 ENCOUNTER — Ambulatory Visit: Payer: Medicare PPO | Admitting: Physical Therapy

## 2017-11-21 ENCOUNTER — Encounter: Payer: Self-pay | Admitting: Physical Therapy

## 2017-11-21 DIAGNOSIS — R29818 Other symptoms and signs involving the nervous system: Secondary | ICD-10-CM | POA: Diagnosis not present

## 2017-11-21 DIAGNOSIS — R296 Repeated falls: Secondary | ICD-10-CM | POA: Diagnosis not present

## 2017-11-21 DIAGNOSIS — R2689 Other abnormalities of gait and mobility: Secondary | ICD-10-CM | POA: Diagnosis not present

## 2017-11-21 DIAGNOSIS — R2681 Unsteadiness on feet: Secondary | ICD-10-CM | POA: Diagnosis not present

## 2017-11-21 DIAGNOSIS — M6281 Muscle weakness (generalized): Secondary | ICD-10-CM | POA: Diagnosis not present

## 2017-11-21 DIAGNOSIS — R279 Unspecified lack of coordination: Secondary | ICD-10-CM | POA: Diagnosis not present

## 2017-11-21 DIAGNOSIS — R262 Difficulty in walking, not elsewhere classified: Secondary | ICD-10-CM | POA: Diagnosis not present

## 2017-11-21 DIAGNOSIS — R293 Abnormal posture: Secondary | ICD-10-CM | POA: Diagnosis not present

## 2017-11-21 DIAGNOSIS — R278 Other lack of coordination: Secondary | ICD-10-CM | POA: Diagnosis not present

## 2017-11-21 NOTE — Patient Instructions (Signed)
Pt encouraged to use assistive device more regularly to prevent falls/injury.

## 2017-11-21 NOTE — Therapy (Addendum)
[place] MAIN [facility] SERVICES [address] [place], [place], [address] Phone: [phone]   Fax:  [phone]  Physical Therapy Treatment  Patient Details  Name: [name] MRN: [id number] Date of Birth: 1951/03/12 Referring Provider: Wells Guiles Tat   Encounter Date: 11/21/2017    Past Medical History:  Diagnosis Date  . Arthritis   . Bradycardia   . Cancer [facility]) 2013   skin cancer  . Depression    ptsd  . Dysrhythmia    chronic slow heart rate  . GERD (gastroesophageal reflux disease)   . Headache(784.0)    tension headaches non recent  . History of chicken pox   . History of kidney stones    passed  . Hypertension    treated with HCTZ  . Parkinson's disease ([place])    dx'ed 15 years ago  . PTSD (post-traumatic stress disorder)   . Shortness of breath dyspnea   . Sleep apnea    doesn't use C-pap  . Varicose veins     Past Surgical History:  Procedure Laterality Date  . CHOLECYSTECTOMY N/A 10/22/2014   Procedure: LAPAROSCOPIC CHOLECYSTECTOMY WITH INTRAOPERATIVE CHOLANGIOGRAM;  Surgeon: Dia Crawford III, MD;  Location: [facility] ORS;  Service: General;  Laterality: N/A;  . cyst removed      from lip as a child  . LUMBAR LAMINECTOMY/DECOMPRESSION MICRODISCECTOMY Bilateral 12/14/2012   Procedure: Bilateral lumbar three-four, four-five decompressive laminotomy/foraminotomy;  Surgeon: Charlie Pitter, MD;  Location: [place] NEURO ORS;  Service: Neurosurgery;  Laterality: Bilateral;  . PULSE GENERATOR IMPLANT Bilateral 12/13/2013   Procedure: Bilateral implantable pulse generator placement;  Surgeon: Erline Levine, MD;  Location: [place] NEURO ORS;  Service: Neurosurgery;  Laterality: Bilateral;  Bilateral implantable pulse generator placement  . skin cancer removed     from ears,   12 lft arm  rt leg 15  . SUBTHALAMIC STIMULATOR BATTERY REPLACEMENT Bilateral 07/14/2017   Procedure: BILATERAL IMPLANTED PULSE GENERATOR CHANGE FOR DEEP BRAIN STIMULATOR;   Surgeon: Erline Levine, MD;  Location: [place];  Service: Neurosurgery;  Laterality: Bilateral;  . SUBTHALAMIC STIMULATOR INSERTION Bilateral 12/06/2013   Procedure: SUBTHALAMIC STIMULATOR INSERTION;  Surgeon: Erline Levine, MD;  Location: [place] NEURO ORS;  Service: Neurosurgery;  Laterality: Bilateral;  Bilateral deep brain stimulator placement    There were no vitals filed for this visit.    Subjective Assessment - 11/28/17 1504    Subjective  Pt reports he fell several times over the past few days but denies injury.  Stated he only uses an AD on occasion "When I need it".  New assistant accompanied pt and wife to therapy this am.    Patient is accompained by:  Family member    Pertinent History  Patient referred for physical therapy due to wife's request for patient to get aquatic therapy to help with his conditioning. Pt is also complaining of chronic low back and R hip pain and frequent falls. 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 The last time he was in the water he was very uncomfortable because he didn't feel like he had any control. Pt has a history of 2 low back surgeries in August 2014 and February 2016. Pt and wife are unable to recall exactly what was performed but they know that he had stenosis. Surgeries were performed by Dr. Annette Stable in [place]. Pt reports that his low back and R hip pain are constant. He describes the  pain in the back as "dull" and the pain in the hip as "sharp." For both back and hip worst pain is 10/10, Best: 2/10, Present: 2/10. No particular episode of trauma to his low back or hip preceding the pain. Pt has a history of Parkinson's with a deep brain stimulator. He ambulates with a 4 wheeled walker called a U-step (automatic brakes, audio cues for stepping). He states that he falls frequently and reports that he has had approximately 40-50 falls in the last 6 months. No specific recent changes in his health.      Currently in Pain?  No/denies        Aquatic Therapy Visit:  Enters/exits via ramp with Min guard   Ambulation, blue dumbbells  Fwd 4 L (Min to mod a)  Side step (ended up with 3# ankle wts) 4L ( Min to Mod A)  Side step with squat, 2 L (verbal cues for sequence and Min A) 2 L high knee march  Bench  Modified high plank (3#) Coordinated UE/LE walk maintaining plank x 2L    Hip strengthening 3# 2 x 10 Hip ext Hamstring curls Ab/add Marches   STS without UE, 20x (min A as needed); increased verbal cues  Stand high knee march, Min to Mod A   Core with UE in wall sit  Blue dumbells, 10x ea   Triceps press downs   Sh abd/add   Sh flex/ext    PT Education: Education Details: exercise techniuque and general safety. Person(s) Educated: patient Methods: explanation Comprehension: verbalized understanding   PT Short Term Goals - 11/07/17 1223      PT SHORT TERM GOAL #1   Title  Pt will perform HEP with family's supervision, for improved balance, transfers, and gait.      Baseline  patient reports compliance, however not every day    Time  4    Period  Weeks    Status  Partially Met    Target Date  12/05/17      PT SHORT TERM GOAL #2   Title  Pt will perform 5x sit<>stand in less than or equal to 15 seconds for improved efficiency and safety with transfers.    Baseline  19 seconds ; 15 seconds    Time  2    Period  Weeks    Status  Achieved        PT Long Term Goals - 11/07/17 1130      PT LONG TERM GOAL #1   Title  Pt will decrease 5TSTS by at least 3 seconds in order to demonstrate clinically significant improvement in LE strength     Baseline  02/08/17: 20.1 seconds 12/18: 17 seconds ; 09/14/17 =24.34 sec with assist due to poor static standing balance    Time  8    Period  Weeks    Status  Achieved      PT LONG TERM GOAL #2   Title  Pt will decrease TUG by at least 3.4s in order to demonstrate decreased fall risk     Baseline  02/08/17: 22.1s  12/18: 20 seconds 2/7: 19 seconds; 4/3: 23 seconds with walker, 09/14/17 15.12 sec 7/23: 15 seconds RW     Time  8    Period  Weeks    Status  Partially Met    Target Date  01/02/18      PT LONG TERM GOAL #3   Title  Pt will improve BERG by at least 3 points in  order to demonstrate clinically significant improvement in balance.    Baseline  02/08/17: 30/56 12/18: 38/56      Time  8    Period  Weeks    Status  Achieved      PT LONG TERM GOAL #4   Title  Pt/wife will verbalize understanding of plans for continued community fitness upon D/C from PT including aquatic therapy program    Baseline  02/08/17: pt currently reports being more comfortable in the water    Time  8    Period  Weeks    Status  Partially Met    Target Date  01/02/18      PT LONG TERM GOAL #5   Title  Pt will decrease mODI scoreby at least 13 points in order demonstrate clinically significant reduction in pain/disability     Baseline  02/08/17: 54% 12/18: 46% 2/7: 42% 4/3; 50%, 09/14/17 42% 7/23: 42%    Time  8    Period  Weeks    Status  Partially Met    Target Date  01/02/18      PT LONG TERM GOAL #6   Title  Pt will improve BERG to 44/56  in order to demonstrate clinically significant improvement in balance for decreased fall risk    Baseline  12/18: 38/56 2/7: 41/56 4/3: 41/56, 09/14/17  19/56 7/23: 44/56    Time  8    Period  Weeks    Status  Partially Met    Target Date  01/02/18         PT End of Session - 11/28/17 1505    Visit Number  52    Number of Visits  65    Date for PT Re-Evaluation  01/02/18    Authorization Type  4/10 PN start 7/23    PT Start Time  1030    PT Stop Time  1115    PT Time Calculation (min)  45 min    Activity Tolerance  Patient tolerated treatment well;Other (comment)    Behavior During Therapy  [facility] for tasks assessed/performed       Plan - 11/28/17 1505    Clinical Impression Statement  Pt generally unsteady walking to pool ramp pushing wheelchair. Close min gaurd  needed for safety and to navvigate up/down ramp. Pt noted to be crossing his feet frequently today with gait. Increased fall risk. Pt had some difficulty in water and required assist at times, especially with backwards gait maintaining upright position.    Rehab Potential  Fair    PT Frequency  2x / week    PT Duration  8 weeks    PT Treatment/Interventions  ADLs/Self Care Home Management;Aquatic Therapy;Cryotherapy;Electrical Stimulation;Iontophoresis 24m/ml Dexamethasone;Moist Heat;Ultrasound;Contrast Bath;DME Instruction;Gait training;Stair training;Functional mobility training;Therapeutic activities;Therapeutic exercise;Balance training;Neuromuscular re-education;Patient/family education;Manual techniques;Wheelchair mobility training;Energy conservation;Passive range of motion    PT Next Visit Plan  progress balance, strength, aquatic therapy    PT Home Exercise Plan  None provided at this time until further assessed, wife reports pt does not perform [facility]    Consulted and Agree with Plan of Care  Patient    Family Member Consulted  wife             Patient will benefit from skilled therapeutic intervention in order to improve the following deficits and impairments:  Abnormal gait, Decreased balance, Decreased coordination, Decreased mobility, Impaired tone, Postural dysfunction  Visit Diagnosis: Muscle weakness (generalized)  Other lack of coordination  Repeated falls  Problem List Patient Active Problem List   Diagnosis Date Noted  . Hematoma 10/18/2017  . Accidental fall 10/18/2017  . 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 Radicular pain in right arm 10/09/2016  . Rash 09/03/2016  . GERD (gastroesophageal reflux disease) 07/31/2016  . Trochanteric bursitis 03/03/2016  . Left hand pain 10/30/2015  . Fracture, finger, multiple sites 10/30/2015  . Colon cancer screening 07/23/2015  . Healthcare maintenance 07/23/2015  . Gout 02/18/2015  . Depression  01/06/2015  . Irritation of eyelid 08/20/2014  . Dupuytren's contracture 08/20/2014  . Cough 07/21/2014  . Lumbar stenosis with neurogenic claudication 06/13/2014  . Preop cardiovascular exam 05/28/2014  . S/P deep brain stimulator placement 05/08/2014  . Joint pain 01/22/2014  . Medicare annual wellness visit, initial 01/19/2014  . Advance care planning 01/19/2014  . Parkinson's disease ([place]) 12/13/2013  . PTSD (post-traumatic stress disorder) 06/13/2013  . Erectile dysfunction 06/13/2013  . HLD (hyperlipidemia) 06/13/2013  . Hip pain 06/10/2013  . Pain in joint, shoulder region 06/10/2013  . Right leg swelling 06/03/2013  . Essential hypertension 06/03/2013  . Bradycardia by electrocardiogram 06/03/2013  . Obstructive sleep apnea 03/12/2013  . Spinal stenosis, lumbar region, with neurogenic claudication 12/14/2012   Chesley Noon, PTA 11/28/17, 3:07 PM  Chesley Noon 11/28/2017, 2:54 PM  [place] MAIN [facility] SERVICES [address] [place], [place], [address] Phone: [phone]   Fax:  [phone]  Name: [name] MRN: [id number] Date of Birth: 08-09-1950

## 2017-11-23 ENCOUNTER — Ambulatory Visit: Payer: Medicare PPO

## 2017-11-23 ENCOUNTER — Encounter: Payer: Medicare PPO | Admitting: Occupational Therapy

## 2017-11-28 ENCOUNTER — Encounter: Payer: Self-pay | Admitting: Physical Therapy

## 2017-11-28 ENCOUNTER — Ambulatory Visit: Payer: Medicare PPO | Admitting: Physical Therapy

## 2017-11-28 DIAGNOSIS — R262 Difficulty in walking, not elsewhere classified: Secondary | ICD-10-CM | POA: Diagnosis not present

## 2017-11-28 DIAGNOSIS — R293 Abnormal posture: Secondary | ICD-10-CM | POA: Diagnosis not present

## 2017-11-28 DIAGNOSIS — R279 Unspecified lack of coordination: Secondary | ICD-10-CM | POA: Diagnosis not present

## 2017-11-28 DIAGNOSIS — R29818 Other symptoms and signs involving the nervous system: Secondary | ICD-10-CM | POA: Diagnosis not present

## 2017-11-28 DIAGNOSIS — R2681 Unsteadiness on feet: Secondary | ICD-10-CM | POA: Diagnosis not present

## 2017-11-28 DIAGNOSIS — R296 Repeated falls: Secondary | ICD-10-CM | POA: Diagnosis not present

## 2017-11-28 DIAGNOSIS — R2689 Other abnormalities of gait and mobility: Secondary | ICD-10-CM | POA: Diagnosis not present

## 2017-11-28 DIAGNOSIS — R278 Other lack of coordination: Secondary | ICD-10-CM | POA: Diagnosis not present

## 2017-11-28 DIAGNOSIS — M6281 Muscle weakness (generalized): Secondary | ICD-10-CM | POA: Diagnosis not present

## 2017-11-28 NOTE — Therapy (Addendum)
[place] MAIN [facility] SERVICES [address] [place], [place], [address] Phone: [phone]   Fax:  [phone]  Physical Therapy Treatment  Patient Details  Name: [name] MRN: [id number] Date of Birth: 09-Mar-1951 Referring Provider: Wells Guiles Tat   Encounter Date: 11/28/2017  PT End of Session - 11/28/17 1212    Visit Number  64    Number of Visits  65    Date for PT Re-Evaluation  01/02/18    Authorization Type  4/10 PN start 7/23    PT Start Time  1030    PT Stop Time  1100    PT Time Calculation (min)  30 min    Activity Tolerance  Patient limited by pain    Behavior During Therapy  [facility] for tasks assessed/performed       Past Medical History:  Diagnosis Date  . Arthritis   . Bradycardia   . Cancer [facility]) 2013   skin cancer  . Depression    ptsd  . Dysrhythmia    chronic slow heart rate  . GERD (gastroesophageal reflux disease)   . Headache(784.0)    tension headaches non recent  . History of chicken pox   . History of kidney stones    passed  . Hypertension    treated with HCTZ  . Parkinson's disease ([place])    dx'ed 15 years ago  . PTSD (post-traumatic stress disorder)   . Shortness of breath dyspnea   . Sleep apnea    doesn't use C-pap  . Varicose veins     Past Surgical History:  Procedure Laterality Date  . CHOLECYSTECTOMY N/A 10/22/2014   Procedure: LAPAROSCOPIC CHOLECYSTECTOMY WITH INTRAOPERATIVE CHOLANGIOGRAM;  Surgeon: Dia Crawford III, MD;  Location: [facility] ORS;  Service: General;  Laterality: N/A;  . cyst removed      from lip as a child  . LUMBAR LAMINECTOMY/DECOMPRESSION MICRODISCECTOMY Bilateral 12/14/2012   Procedure: Bilateral lumbar three-four, four-five decompressive laminotomy/foraminotomy;  Surgeon: Charlie Pitter, MD;  Location: [place] NEURO ORS;  Service: Neurosurgery;  Laterality: Bilateral;  . PULSE GENERATOR IMPLANT Bilateral 12/13/2013   Procedure: Bilateral implantable pulse generator  placement;  Surgeon: Erline Levine, MD;  Location: [place] NEURO ORS;  Service: Neurosurgery;  Laterality: Bilateral;  Bilateral implantable pulse generator placement  . skin cancer removed     from ears,   12 lft arm  rt leg 15  . SUBTHALAMIC STIMULATOR BATTERY REPLACEMENT Bilateral 07/14/2017   Procedure: BILATERAL IMPLANTED PULSE GENERATOR CHANGE FOR DEEP BRAIN STIMULATOR;  Surgeon: Erline Levine, MD;  Location: [place];  Service: Neurosurgery;  Laterality: Bilateral;  . SUBTHALAMIC STIMULATOR INSERTION Bilateral 12/06/2013   Procedure: SUBTHALAMIC STIMULATOR INSERTION;  Surgeon: Erline Levine, MD;  Location: [place] NEURO ORS;  Service: Neurosurgery;  Laterality: Bilateral;  Bilateral deep brain stimulator placement    There were no vitals filed for this visit.  Subjective Assessment - 11/28/17 1201    Subjective  Pt reported frequent falls again the past few days.  Pt does not use AD.  Wife stated U-steps walker seems unsafe for him as it frequently tips up on 2 wheels.  Stated he tried to shower unassisted which resulted in a fall while she was laying down.  Pt reports no injury but upon further questioning during session pt stated he had significant R hip pain this moring 10/10 and was unable to get out of bed.  Stated water has relieved it to 6/10.  No inc pain with  WB noted or deformity inclucing LE shorteing, ER etc.    Also noted to have bruising L foot at base of toes about the size of a baseball.  He denies pain with WB but is tender to touch.  No deformity noted.    Patient is accompained by:  Family member    Pertinent History  Patient referred for physical therapy due to wife's request for patient to get aquatic therapy to help with his conditioning. Pt is also complaining of chronic low back and R hip pain and frequent falls. 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 The last time he was in the water he was very uncomfortable because he didn't  feel like he had any control. Pt has a history of 2 low back surgeries in August 2014 and February 2016. Pt and wife are unable to recall exactly what was performed but they know that he had stenosis. Surgeries were performed by Dr. Annette Stable in [place]. Pt reports that his low back and R hip pain are constant. He describes the pain in the back as "dull" and the pain in the hip as "sharp." For both back and hip worst pain is 10/10, Best: 2/10, Present: 2/10. No particular episode of trauma to his low back or hip preceding the pain. Pt has a history of Parkinson's with a deep brain stimulator. He ambulates with a 4 wheeled walker called a U-step (automatic brakes, audio cues for stepping). He states that he falls frequently and reports that he has had approximately 40-50 falls in the last 6 months. No specific recent changes in his health.     Currently in Pain?  Yes    Pain Score  6     Pain Location  Hip    Pain Orientation  Right    Pain Descriptors / Indicators  Aching;Constant    Pain Type  Acute pain    Pain Onset  In the past 7 days    Pain Frequency  Constant    Pain Score  4    Pain Location  Foot    Pain Orientation  Left    Pain Descriptors / Indicators  Sore    Pain Type  Acute pain    Pain Onset  In the past 7 days    Pain Frequency  Intermittent    Aggravating Factors   touch       Aquatic Therapy:   Pt to session today with transport chair.  Pt and wife report frequent falls this week-end.  Initially deny injury but upon further questioning during session, pt stated he had 10/10 hip pain this am and was unable to get out of bed.  Wife stated she massaged his hip with a rolling pin and was assisting with stretches.  He did not report full extent of pain to wife as she was surprised at end of session that he had reported it at 10/10.  He did report water helped decrease pain to 6/10.  Pt had no increased pain with weight bearing or deformity noted.  He also was noted to have a  baseball sized bruise at the base of his L toes.  No deformity noted but was tender to touch.  Wife stated "we have a boot at home" but was encouraged to follow up with MD.  Session was limited today and focused on gentle gait in pool forward, backwards and sideways with blue dumb bells for  support and CGA to mod assist due to imbalances at times.  Rest breaks needed but pt reported no increased pain and overall relieved pain.  Attempted UE exercises for core strength but he had difficulty maintaining position today.    3# ankle weights were used for all activity to assist with water postioning.    Session was cut short today due to concerns over R hip pain and L foot.  Pt and wife were encouraged to follow up with MD and pt agreed this was an appropriate course of action but I am unsure if they will actually do so or will try to treat on their own.    Discussed with primary PT and updated on falls and complaints.     PT Education - 11/28/17 1207    Education provided  Yes    Education Details  Safety with gait and mobiltiy, encouraged pt to visit MD for foot and hip pain.    Person(s) Educated  Patient;Spouse    Comprehension  Verbalized understanding;Need further instruction       PT Short Term Goals - 11/07/17 1223      PT SHORT TERM GOAL #1   Title  Pt will perform HEP with family's supervision, for improved balance, transfers, and gait.      Baseline  patient reports compliance, however not every day    Time  4    Period  Weeks    Status  Partially Met    Target Date  12/05/17      PT SHORT TERM GOAL #2   Title  Pt will perform 5x sit<>stand in less than or equal to 15 seconds for improved efficiency and safety with transfers.    Baseline  19 seconds ; 15 seconds    Time  2    Period  Weeks    Status  Achieved        PT Long Term Goals - 11/07/17 1130      PT LONG TERM GOAL #1   Title  Pt will decrease 5TSTS by at least 3 seconds in order to demonstrate clinically  significant improvement in LE strength     Baseline  02/08/17: 20.1 seconds 12/18: 17 seconds ; 09/14/17 =24.34 sec with assist due to poor static standing balance    Time  8    Period  Weeks    Status  Achieved      PT LONG TERM GOAL #2   Title  Pt will decrease TUG by at least 3.4s in order to demonstrate decreased fall risk     Baseline  02/08/17: 22.1s 12/18: 20 seconds 2/7: 19 seconds; 4/3: 23 seconds with walker, 09/14/17 15.12 sec 7/23: 15 seconds RW     Time  8    Period  Weeks    Status  Partially Met    Target Date  01/02/18      PT LONG TERM GOAL #3   Title  Pt will improve BERG by at least 3 points in order to demonstrate clinically significant improvement in balance.    Baseline  02/08/17: 30/56 12/18: 38/56      Time  8    Period  Weeks    Status  Achieved      PT LONG TERM GOAL #4   Title  Pt/wife will verbalize understanding of plans for continued community fitness upon D/C from PT including aquatic therapy program    Baseline  02/08/17: pt currently reports being more comfortable in the water  Time  8    Period  Weeks    Status  Partially Met    Target Date  01/02/18      PT LONG TERM GOAL #5   Title  Pt will decrease mODI scoreby at least 13 points in order demonstrate clinically significant reduction in pain/disability     Baseline  02/08/17: 54% 12/18: 46% 2/7: 42% 4/3; 50%, 09/14/17 42% 7/23: 42%    Time  8    Period  Weeks    Status  Partially Met    Target Date  01/02/18      PT LONG TERM GOAL #6   Title  Pt will improve BERG to 44/56  in order to demonstrate clinically significant improvement in balance for decreased fall risk    Baseline  12/18: 38/56 2/7: 41/56 4/3: 41/56, 09/14/17  19/56 7/23: 44/56    Time  8    Period  Weeks    Status  Partially Met    Target Date  01/02/18            Plan - 11/28/17 1212    Clinical Impression Statement  Pt limited today during session due to hip pain and general difficulty completing ex plan.   Increased rest breaks needed.  While he stated water relieved hip pain some but overall remained with difficulties.    Clinical Presentation  Unstable    Clinical Decision Making  High    Rehab Potential  Fair    PT Frequency  2x / week    PT Duration  8 weeks    PT Treatment/Interventions  ADLs/Self Care Home Management;Aquatic Therapy;Cryotherapy;Electrical Stimulation;Iontophoresis '4mg'$ /ml Dexamethasone;Moist Heat;Ultrasound;Contrast Bath;DME Instruction;Gait training;Stair training;Functional mobility training;Therapeutic activities;Therapeutic exercise;Balance training;Neuromuscular re-education;Patient/family education;Manual techniques;Wheelchair mobility training;Energy conservation;Passive range of motion    PT Next Visit Plan  progress balance, strength, aquatic therapy - discussed with Primary PT today.     PT Home Exercise Plan  None provided at this time until further assessed, wife reports pt does not perform current HEP from [facility] PT    Consulted and Agree with Plan of Care  Patient    Family Member Consulted  wife       Patient will benefit from skilled therapeutic intervention in order to improve the following deficits and impairments:  Abnormal gait, Decreased balance, Decreased coordination, Decreased mobility, Impaired tone, Postural dysfunction  Visit Diagnosis: Muscle weakness (generalized)  Other lack of coordination  Repeated falls     Problem List Patient Active Problem List   Diagnosis Date Noted  . Hematoma 10/18/2017  . Accidental fall 10/18/2017  . 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 Radicular pain in right arm 10/09/2016  . Rash 09/03/2016  . GERD (gastroesophageal reflux disease) 07/31/2016  . Trochanteric bursitis 03/03/2016  . Left hand pain 10/30/2015  . Fracture, finger, multiple sites 10/30/2015  . Colon cancer screening 07/23/2015  . Healthcare maintenance 07/23/2015  . Gout 02/18/2015  . Depression 01/06/2015  . Irritation of eyelid  08/20/2014  . Dupuytren's contracture 08/20/2014  . Cough 07/21/2014  . Lumbar stenosis with neurogenic claudication 06/13/2014  . Preop cardiovascular exam 05/28/2014  . S/P deep brain stimulator placement 05/08/2014  . Joint pain 01/22/2014  . Medicare annual wellness visit, initial 01/19/2014  . Advance care planning 01/19/2014  . Parkinson's disease ([place]) 12/13/2013  . PTSD (post-traumatic stress disorder) 06/13/2013  . Erectile dysfunction 06/13/2013  . HLD (hyperlipidemia) 06/13/2013  . Hip pain 06/10/2013  . Pain in joint, shoulder region 06/10/2013  .  Right leg swelling 06/03/2013  . Essential hypertension 06/03/2013  . Bradycardia by electrocardiogram 06/03/2013  . Obstructive sleep apnea 03/12/2013  . Spinal stenosis, lumbar region, with neurogenic claudication 12/14/2012   Chesley Noon, PTA 11/28/17, 1:16 PM  Chesley Noon 11/28/2017, 1:16 PM  [place] MAIN [facility] SERVICES [address] [place], [place], [address] Phone: [phone]   Fax:  [phone]  Name: [name] MRN: [id number] Date of Birth: Jul 11, 1950

## 2017-11-28 NOTE — Patient Instructions (Signed)
Follow up with MD

## 2017-11-29 ENCOUNTER — Telehealth: Payer: Self-pay | Admitting: Neurology

## 2017-11-29 ENCOUNTER — Ambulatory Visit: Payer: Self-pay | Admitting: Internal Medicine

## 2017-11-29 NOTE — Telephone Encounter (Signed)
Spoke with Lenda Kelp, she wanted to know what the goals of PT should be if patient should not be walking. He is currently undergoing therapy and they are working on balance, transfers, etc.   Dr. Carles Collet - please advise. I am copying Lenda Kelp on this message as well.

## 2017-11-29 NOTE — Telephone Encounter (Signed)
Lenda Kelp called from [facility] regarding this mutual patient. Please Call. Thanks

## 2017-11-29 NOTE — Telephone Encounter (Signed)
Tried to call Lenda Kelp back, she was with a patient and will get back with me.

## 2017-11-30 ENCOUNTER — Ambulatory Visit: Payer: Medicare PPO

## 2017-11-30 ENCOUNTER — Ambulatory Visit (INDEPENDENT_AMBULATORY_CARE_PROVIDER_SITE_OTHER): Payer: Medicare PPO

## 2017-11-30 ENCOUNTER — Ambulatory Visit: Payer: Medicare PPO | Admitting: Occupational Therapy

## 2017-11-30 ENCOUNTER — Ambulatory Visit
Admission: EM | Admit: 2017-11-30 | Discharge: 2017-11-30 | Disposition: A | Discharge status: Home / Self Care | Payer: Medicare PPO | Attending: Family Medicine | Admitting: Family Medicine

## 2017-11-30 ENCOUNTER — Encounter: Payer: Self-pay | Admitting: Emergency Medicine

## 2017-11-30 DIAGNOSIS — R278 Other lack of coordination: Secondary | ICD-10-CM

## 2017-11-30 DIAGNOSIS — M25551 Pain in right hip: Secondary | ICD-10-CM | POA: Diagnosis not present

## 2017-11-30 DIAGNOSIS — M6281 Muscle weakness (generalized): Secondary | ICD-10-CM

## 2017-11-30 DIAGNOSIS — W19XXXA Unspecified fall, initial encounter: Secondary | ICD-10-CM

## 2017-11-30 DIAGNOSIS — R296 Repeated falls: Secondary | ICD-10-CM

## 2017-11-30 DIAGNOSIS — S9032XA Contusion of left foot, initial encounter: Secondary | ICD-10-CM

## 2017-11-30 DIAGNOSIS — R2689 Other abnormalities of gait and mobility: Secondary | ICD-10-CM

## 2017-11-30 DIAGNOSIS — M79672 Pain in left foot: Secondary | ICD-10-CM

## 2017-11-30 DIAGNOSIS — S79911A Unspecified injury of right hip, initial encounter: Secondary | ICD-10-CM | POA: Diagnosis not present

## 2017-11-30 DIAGNOSIS — S99922A Unspecified injury of left foot, initial encounter: Secondary | ICD-10-CM | POA: Diagnosis not present

## 2017-11-30 NOTE — Telephone Encounter (Signed)
Those are the goals.  Plus, he is VERY noncompliant in terms of staying seated and chooses to walk despite recommendations, so I would like them to work on balance as much as possible as he is falling a lot.

## 2017-11-30 NOTE — Telephone Encounter (Signed)
[name].

## 2017-11-30 NOTE — Therapy (Signed)
[place] MAIN [facility] SERVICES [address] [name], [place], [address] Phone: [phone]   Fax:  [phone]  Physical Therapy Treatment  Patient Details  Name: [name] MRN: [id number] Date of Birth: January 12, 1951 Referring Provider: Wells Guiles Tat   Encounter Date: 11/30/2017    Past Medical History:  Diagnosis Date  . Arthritis   . Bradycardia   . Cancer [place]) 2013   skin cancer  . Depression    ptsd  . Dysrhythmia    chronic slow heart rate  . GERD (gastroesophageal reflux disease)   . Headache(784.0)    tension headaches non recent  . History of chicken pox   . History of kidney stones    passed  . Hypertension    treated with HCTZ  . Parkinson's disease ([place])    dx'ed 15 years ago  . PTSD (post-traumatic stress disorder)   . Shortness of breath dyspnea   . Sleep apnea    doesn't use C-pap  . Varicose veins     Past Surgical History:  Procedure Laterality Date  . CHOLECYSTECTOMY N/A 10/22/2014   Procedure: LAPAROSCOPIC CHOLECYSTECTOMY WITH INTRAOPERATIVE CHOLANGIOGRAM;  Surgeon: Dia Crawford III, MD;  Location: [facility] ORS;  Service: General;  Laterality: N/A;  . cyst removed      from lip as a child  . LUMBAR LAMINECTOMY/DECOMPRESSION MICRODISCECTOMY Bilateral 12/14/2012   Procedure: Bilateral lumbar three-four, four-five decompressive laminotomy/foraminotomy;  Surgeon: Charlie Pitter, MD;  Location: [place] NEURO ORS;  Service: Neurosurgery;  Laterality: Bilateral;  . PULSE GENERATOR IMPLANT Bilateral 12/13/2013   Procedure: Bilateral implantable pulse generator placement;  Surgeon: Erline Levine, MD;  Location: [place] NEURO ORS;  Service: Neurosurgery;  Laterality: Bilateral;  Bilateral implantable pulse generator placement  . skin cancer removed     from ears,   12 lft arm  rt leg 15  . SUBTHALAMIC STIMULATOR BATTERY REPLACEMENT Bilateral 07/14/2017   Procedure: BILATERAL IMPLANTED PULSE GENERATOR CHANGE FOR DEEP BRAIN STIMULATOR;   Surgeon: Erline Levine, MD;  Location: [place];  Service: Neurosurgery;  Laterality: Bilateral;  . SUBTHALAMIC STIMULATOR INSERTION Bilateral 12/06/2013   Procedure: SUBTHALAMIC STIMULATOR INSERTION;  Surgeon: Erline Levine, MD;  Location: [place] NEURO ORS;  Service: Neurosurgery;  Laterality: Bilateral;  Bilateral deep brain stimulator placement    There were no vitals filed for this visit.    Patient arrived with wife; discussed doctors orders of being chair bound at home as well as discussed recent falls.   L foot : tender to palpation and weightbearing, noted jump upon second met palpation  Right hip: pain with weightbearing increase to 9/10. Patient pain with hip flexion  Due to Positive [place] Foot rules patient requires sending to Urgent care for imaging of R foot. Due to pain with weightbearing and joint narrowing movements as well as palpation need for R hip imaging additionally.                        PT Short Term Goals - 11/07/17 1223      PT SHORT TERM GOAL #1   Title  Pt will perform HEP with family's supervision, for improved balance, transfers, and gait.      Baseline  patient reports compliance, however not every day    Time  4    Period  Weeks    Status  Partially Met    Target Date  12/05/17      PT SHORT TERM GOAL #2  Title  Pt will perform 5x sit<>stand in less than or equal to 15 seconds for improved efficiency and safety with transfers.    Baseline  19 seconds ; 15 seconds    Time  2    Period  Weeks    Status  Achieved        PT Long Term Goals - 11/07/17 1130      PT LONG TERM GOAL #1   Title  Pt will decrease 5TSTS by at least 3 seconds in order to demonstrate clinically significant improvement in LE strength     Baseline  02/08/17: 20.1 seconds 12/18: 17 seconds ; 09/14/17 =24.34 sec with assist due to poor static standing balance    Time  8    Period  Weeks    Status  Achieved      PT LONG TERM GOAL #2   Title  Pt will decrease  TUG by at least 3.4s in order to demonstrate decreased fall risk     Baseline  02/08/17: 22.1s 12/18: 20 seconds 2/7: 19 seconds; 4/3: 23 seconds with walker, 09/14/17 15.12 sec 7/23: 15 seconds RW     Time  8    Period  Weeks    Status  Partially Met    Target Date  01/02/18      PT LONG TERM GOAL #3   Title  Pt will improve BERG by at least 3 points in order to demonstrate clinically significant improvement in balance.    Baseline  02/08/17: 30/56 12/18: 38/56      Time  8    Period  Weeks    Status  Achieved      PT LONG TERM GOAL #4   Title  Pt/wife will verbalize understanding of plans for continued community fitness upon D/C from PT including aquatic therapy program    Baseline  02/08/17: pt currently reports being more comfortable in the water    Time  8    Period  Weeks    Status  Partially Met    Target Date  01/02/18      PT LONG TERM GOAL #5   Title  Pt will decrease mODI scoreby at least 13 points in order demonstrate clinically significant reduction in pain/disability     Baseline  02/08/17: 54% 12/18: 46% 2/7: 42% 4/3; 50%, 09/14/17 42% 7/23: 42%    Time  8    Period  Weeks    Status  Partially Met    Target Date  01/02/18      PT LONG TERM GOAL #6   Title  Pt will improve BERG to 44/56  in order to demonstrate clinically significant improvement in balance for decreased fall risk    Baseline  12/18: 38/56 2/7: 41/56 4/3: 41/56, 09/14/17  19/56 7/23: 44/56    Time  8    Period  Weeks    Status  Partially Met    Target Date  01/02/18              Patient will benefit from skilled therapeutic intervention in order to improve the following deficits and impairments:     Visit Diagnosis: Muscle weakness (generalized)  Other lack of coordination  Repeated falls  Other abnormalities of gait and mobility     Problem List Patient Active Problem List   Diagnosis Date Noted  . Hematoma 10/18/2017  . Accidental fall 10/18/2017  . REM behavioral disorder  11/02/2016  . Radicular pain in right arm 10/09/2016  .  Rash 09/03/2016  . GERD (gastroesophageal reflux disease) 07/31/2016  . Trochanteric bursitis 03/03/2016  . Left hand pain 10/30/2015  . Fracture, finger, multiple sites 10/30/2015  . Colon cancer screening 07/23/2015  . Healthcare maintenance 07/23/2015  . Gout 02/18/2015  . Depression 01/06/2015  . Irritation of eyelid 08/20/2014  . Dupuytren's contracture 08/20/2014  . Cough 07/21/2014  . Lumbar stenosis with neurogenic claudication 06/13/2014  . Preop cardiovascular exam 05/28/2014  . S/P deep brain stimulator placement 05/08/2014  . Joint pain 01/22/2014  . Medicare annual wellness visit, initial 01/19/2014  . Advance care planning 01/19/2014  . Parkinson's disease ([place]) 12/13/2013  . PTSD (post-traumatic stress disorder) 06/13/2013  . Erectile dysfunction 06/13/2013  . HLD (hyperlipidemia) 06/13/2013  . Hip pain 06/10/2013  . Pain in joint, shoulder region 06/10/2013  . Right leg swelling 06/03/2013  . Essential hypertension 06/03/2013  . Bradycardia by electrocardiogram 06/03/2013  . Obstructive sleep apnea 03/12/2013  . Spinal stenosis, lumbar region, with neurogenic claudication 12/14/2012   Janna Arch, PT, DPT   11/30/2017, 9:07 AM  [place] MAIN [facility] SERVICES [address] [place], [place], [address] Phone: [phone]   Fax:  [phone]  Name: NAFTULA DONAHUE MRN: [id number] Date of Birth: Feb 10, 1951

## 2017-11-30 NOTE — ED Provider Notes (Signed)
[facility]    CSN: [id number] Arrival date & time: 11/30/17  0931     History   Chief Complaint Chief Complaint  Patient presents with  . Fall    HPI [name] is a 67 y.o. male.   HPI  67 year old male with Parkinson's been falling frequently.  He is under the care of Dr. Carles Collet who has wanted him to stay in a seated position but despite this he gets up and moves around and falls . his wife accompanies him states that on Sunday she had a terrible migraine and was "out of it" with medications to control the migraine, when her husband got up and moved around and fell several times.  She thinks that when he fell in the shower was the time that may have injured his left foot and his right hip.  He can put weight on the right side but only for short periods of time.  He was at the physical therapist today and she did not want to perform any further therapy until he was cleared with x-rays.  Pain is mostly over the lateral right hip where there is tenderness just slightly below the greater trochanteric region and his left foot shows ecchymosis over the forefoot and lateral.  Maximum tenderness seems to be over the third metatarsal neck less so over the fourth and fifth.  She is noted to have bradycardia at 47 bpm but in review of his medical records he is under the care of a audiologist.  His most recent EKG in January 2019 confirmed the bradycardia she shows today.  Is essentially asymptomatic from the cardiologist point of view.       Past Medical History:  Diagnosis Date  . Arthritis   . Bradycardia   . Cancer [facility]) 2013   skin cancer  . Depression    ptsd  . Dysrhythmia    chronic slow heart rate  . GERD (gastroesophageal reflux disease)   . Headache(784.0)    tension headaches non recent  . History of chicken pox   . History of kidney stones    passed  . Hypertension    treated with HCTZ  . Parkinson's disease ([place])    dx'ed 15 years ago  . PTSD  (post-traumatic stress disorder)   . Shortness of breath dyspnea   . Sleep apnea    doesn't use C-pap  . Varicose veins     Patient Active Problem List   Diagnosis Date Noted  . Hematoma 10/18/2017  . Accidental fall 10/18/2017  . 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 Radicular pain in right arm 10/09/2016  . Rash 09/03/2016  . GERD (gastroesophageal reflux disease) 07/31/2016  . Trochanteric bursitis 03/03/2016  . Left hand pain 10/30/2015  . Fracture, finger, multiple sites 10/30/2015  . Colon cancer screening 07/23/2015  . Healthcare maintenance 07/23/2015  . Gout 02/18/2015  . Depression 01/06/2015  . Irritation of eyelid 08/20/2014  . Dupuytren's contracture 08/20/2014  . Cough 07/21/2014  . Lumbar stenosis with neurogenic claudication 06/13/2014  . Preop cardiovascular exam 05/28/2014  . S/P deep brain stimulator placement 05/08/2014  . Joint pain 01/22/2014  . Medicare annual wellness visit, initial 01/19/2014  . Advance care planning 01/19/2014  . Parkinson's disease ([place]) 12/13/2013  . PTSD (post-traumatic stress disorder) 06/13/2013  . Erectile dysfunction 06/13/2013  . HLD (hyperlipidemia) 06/13/2013  . Hip pain 06/10/2013  . Pain in joint, shoulder region 06/10/2013  . Right leg swelling 06/03/2013  . Essential  hypertension 06/03/2013  . Bradycardia by electrocardiogram 06/03/2013  . Obstructive sleep apnea 03/12/2013  . Spinal stenosis, lumbar region, with neurogenic claudication 12/14/2012    Past Surgical History:  Procedure Laterality Date  . CHOLECYSTECTOMY N/A 10/22/2014   Procedure: LAPAROSCOPIC CHOLECYSTECTOMY WITH INTRAOPERATIVE CHOLANGIOGRAM;  Surgeon: Dia Crawford III, MD;  Location: [facility] ORS;  Service: General;  Laterality: N/A;  . cyst removed      from lip as a child  . LUMBAR LAMINECTOMY/DECOMPRESSION MICRODISCECTOMY Bilateral 12/14/2012   Procedure: Bilateral lumbar three-four, four-five decompressive laminotomy/foraminotomy;  Surgeon: Charlie Pitter, MD;  Location: [place] [facility] OR[facility];  Service: Neurosurgery;  Laterality: Bilateral;  . PULSE GENERATOR IMPLANT Bilateral 12/13/2013   Procedure: Bilateral implantable pulse generator placement;  Surgeon: Erline Levine, MD;  Location: [place] [facility];  Service: Neurosurgery;  Laterality: Bilateral;  Bilateral implantable pulse generator placement  . skin cancer removed     from ears,   12 lft arm  rt leg 15  . SUBTHALAMIC STIMULATOR BATTERY REPLACEMENT Bilateral 07/14/2017   Procedure: BILATERAL IMPLANTED PULSE GENERATOR CHANGE FOR DEEP BRAIN STIMULATOR;  Surgeon: Erline Levine, MD;  Location: [place];  Service: Neurosurgery;  Laterality: Bilateral;  . SUBTHALAMIC STIMULATOR INSERTION Bilateral 12/06/2013   Procedure: SUBTHALAMIC STIMULATOR INSERTION;  Surgeon: Erline Levine, MD;  Location: [place] [facility];  Service: Neurosurgery;  Laterality: Bilateral;  Bilateral deep brain stimulator placement       Home Medications    Prior to Admission medications   Medication Sig Start Date End Date Taking? Authorizing Provider  buPROPion (WELLBUTRIN XL) 150 MG 24 hr tablet TAKE 3 TABLETS DAILY 10/30/17  Yes Tonia Ghent, MD  carbidopa-levodopa (SINEMET IR) 25-100 MG tablet TAKE 2 TABLETS IN THE MORNING , 1 TABLET IN THE AFTERNOON AND 1 TABLET IN THE EVENING 06/26/17  Yes Tat, Eustace Quail, DO  cholecalciferol (VITAMIN D) 1000 UNITS tablet Take 1,000 Units by mouth daily.   Yes [provider]  escitalopram (LEXAPRO) 10 MG tablet TAKE 1 TABLET DAILY 09/01/17  Yes Tonia Ghent, MD  memantine (NAMENDA) 10 MG tablet TAKE 1 TABLET TWICE A DAY 11/07/17  Yes Tat, Eustace Quail, DO  omeprazole (PRILOSEC) 20 MG capsule Take 20 mg by mouth daily Patient taking differently: Take 20 mg by mouth daily.  07/07/17  Yes Tonia Ghent, MD  pravastatin (PRAVACHOL) 20 MG tablet TAKE 1 TABLET EVERY EVENING Patient taking differently: TAKE 20 MG BY MOUTH EVERY EVENING 06/01/17  Yes Gollan, Kathlene November, MD  naproxen sodium (ALEVE)  220 MG tablet Take 440 mg by mouth 2 (two) times daily as needed (for pain or headache).    [provider]  protriptyline (VIVACTIL) 5 MG tablet 1 at bedtime 07/27/17   Baird Lyons D, MD  temazepam (RESTORIL) 15 MG capsule 1 at bedtime for sleep 07/27/17   Deneise Lever, MD    Family History Family History  Problem Relation Age of Onset  . Lung cancer Father   . Colon cancer Neg Hx   . Prostate cancer Neg Hx     Social History Social History   Tobacco Use  . Smoking status: Never Smoker  . Smokeless tobacco: Former Network engineer Use Topics  . Alcohol use: Yes    Alcohol/week: 0.0 standard drinks    Comment: occasional (twice per month)  . Drug use: No     Allergies   Klonopin [clonazepam]; Morphine and related; and Prednisone   Review of Systems Review of Systems  Constitutional:  Positive for activity change. Negative for appetite change, chills, diaphoresis, fatigue and fever.  Musculoskeletal: Positive for arthralgias, gait problem and myalgias.  All other systems reviewed and are negative.    Physical Exam Triage Vital Signs ED Triage Vitals  Enc Vitals Group     BP 11/30/17 0955 127/83     Pulse Rate 11/30/17 0955 (!) 47     Resp 11/30/17 0955 18     Temp 11/30/17 0955 98.1 F (36.7 C)     Temp Source 11/30/17 0955 Oral     SpO2 11/30/17 0955 99 %     Weight 11/30/17 0958 186 lb (84.4 kg)     Height --      Head Circumference --      Peak Flow --      Pain Score 11/30/17 0958 4     Pain Loc --      Pain Edu? --      Excl. in [place]? --    No data found.  Updated Vital Signs BP 127/83 (BP Location: Right Arm)   Pulse (!) 47   Temp 98.1 F (36.7 C) (Oral)   Resp 18   Wt 186 lb (84.4 kg)   SpO2 99%   BMI 28.28 kg/m   Visual Acuity Right Eye Distance:   Left Eye Distance:   Bilateral Distance:    Right Eye Near:   Left Eye Near:    Bilateral Near:     Physical Exam  Constitutional: He is oriented to person, place, and time.  He appears well-developed and well-nourished. No distress.  HENT:  Head: Normocephalic.  Eyes: Pupils are equal, round, and reactive to light. EOM are normal. Right eye exhibits no discharge. Left eye exhibits no discharge.  Neck: Normal range of motion.  Musculoskeletal:  Patient is in a wheelchair.  Exam was performed in the wheelchair.  With the right hip in full extension rotation of the hip does not cause discomfort and shows good range of motion internal and external rotation.  There is tenderness of the hip mostly noticed just inferior to the greater trochanter.  He does not appear to be any anterior hip pain or induration.  Clearly he does have some mild tenderness along the greater trochanteric line.  Examination of the left foot shows ecchymosis of the forefoot from the third metatarsal laterally.  Maximum tenderness is localized over the third metatarsal neck less so over the fourth and fifth.  Remainder of r the foot is nontender.  Neurological: He is alert and oriented to person, place, and time.  Skin: Skin is warm and dry. He is not diaphoretic.  Psychiatric: He has a normal mood and affect. His behavior is normal. Judgment and thought content normal.  Nursing note and vitals reviewed.    UC Treatments / Results  Labs (all labs ordered are listed, but only abnormal results are displayed) Labs Reviewed - No data to display  EKG None  Radiology Dg Foot Complete Left  Result Date: 11/30/2017 CLINICAL DATA:  Left foot pain following a fall this morning. The pain is in the region of the 1st 3 toes with mid tarsal tenderness dorsally. EXAM: LEFT FOOT - COMPLETE 3+ VIEW COMPARISON:  Left os calcis dated 06/11/2017. FINDINGS: Moderate lateral spur formation at the 1st MTP joint. Atheromatous arterial calcifications. Calcaneal spurs. No fracture or dislocation seen. IMPRESSION: No fracture. Degenerative changes. Electronically Signed   By: Claudie Revering M.D.   On: 11/30/2017 10:38    Dg  Hip Unilat With Pelvis 2-3 Views Right  Result Date: 11/30/2017 CLINICAL DATA:  Right hip pain following a fall this morning. Multiple falls over the past few months. EXAM: DG HIP (WITH OR WITHOUT PELVIS) 2-3V RIGHT COMPARISON:  03/02/2016. FINDINGS: Normal appearing right hip without fracture or dislocation. Lower lumbar spine degenerative changes and L4-5 fusion hardware. Atheromatous arterial calcifications. IMPRESSION: No fracture or dislocation. Electronically Signed   By: Claudie Revering M.D.   On: 11/30/2017 10:35    Procedures Procedures (including critical care time)  Medications Ordered in UC Medications - No data to display  Initial Impression / Assessment and Plan / UC Course  I have reviewed the triage vital signs and the nursing notes.  Pertinent labs & imaging results that were available during my care of the patient were reviewed by me and considered in my medical decision making (see chart for details).     Plan: 1. Test/x-ray results and diagnosis reviewed with patient 2. rx as per orders; risks, benefits, potential side effects reviewed with patient 3. Recommend supportive treatment with ice and elevation to control swelling and pain.  He may participate with physical therapy.  Cautioned regarding walking without supervision or assistance.  Recommend following up with primary care. 4. F/u prn if symptoms worsen or don't improve  Final Clinical Impressions(s) / UC Diagnoses   Final diagnoses:  Acute right hip pain  Fall, initial encounter  Contusion of left foot, initial encounter     Discharge Instructions     Apply ice 20 minutes out of every 2 hours 4-5 times daily for comfort.  Elevate above your heart sufficiently to control pain and swelling    ED Prescriptions    None     Controlled Substance Prescriptions [place] Controlled Substance Registry consulted? Not Applicable   Lorin Picket, PA-C 11/30/17 1139

## 2017-11-30 NOTE — Discharge Instructions (Signed)
Apply ice 20 minutes out of every 2 hours 4-5 times daily for comfort.  Elevate above your heart sufficiently to control pain and swelling

## 2017-11-30 NOTE — ED Triage Notes (Signed)
Pt has a history of parkinson's and did fall Sunday and would like xray's done. Having pain in his right hip, leg and foot. No otc meds tried. Can put weight on his right side but for a short period.

## 2017-12-01 ENCOUNTER — Encounter: Payer: Self-pay | Admitting: Family Medicine

## 2017-12-01 ENCOUNTER — Ambulatory Visit (INDEPENDENT_AMBULATORY_CARE_PROVIDER_SITE_OTHER): Payer: Medicare PPO | Admitting: Family Medicine

## 2017-12-01 VITALS — BP 116/62 | HR 53 | Temp 97.6°F | Ht 68.0 in | Wt 182.8 lb

## 2017-12-01 DIAGNOSIS — M48062 Spinal stenosis, lumbar region with neurogenic claudication: Secondary | ICD-10-CM | POA: Diagnosis not present

## 2017-12-01 DIAGNOSIS — G20 Parkinson's disease: Secondary | ICD-10-CM | POA: Diagnosis not present

## 2017-12-01 DIAGNOSIS — L989 Disorder of the skin and subcutaneous tissue, unspecified: Secondary | ICD-10-CM | POA: Diagnosis not present

## 2017-12-01 MED ORDER — CEPHALEXIN 500 MG PO CAPS: 500.0000 mg | ORAL_CAPSULE | Freq: Four times a day (QID) | ORAL | 0 refills | Status: DC

## 2017-12-01 NOTE — Progress Notes (Signed)
BP 116/62 (BP Location: Left Arm, Patient Position: Sitting, Cuff Size: Normal)   Pulse (!) 53   Temp 97.6 F (36.4 C) (Oral)   Ht 5\' 8"  (1.727 m)   Wt 182 lb 12 oz (82.9 kg)   SpO2 98%   BMI 27.79 kg/m    CC: "boil on butt" Subjective:    Patient ID: [name], male    DOB: Jan 17, 1951, 67 y.o.   MRN: [id number]  HPI: [name] is a 67 y.o. male presenting on 12/01/2017 for Abscess (Located between buttocks. Noticed this morning and is painful. )   Noticed this morning. Has never had this before. It's not draining.  Denies fevers/chills, nausea/vomiting.  No other rashes, skin infections, boils.  Not on blood thinners.   Relevant past medical, surgical, family and social history reviewed and updated as indicated. Interim medical history since our last visit reviewed. Allergies and medications reviewed and updated. Outpatient Medications Prior to Visit  Medication Sig Dispense Refill  . buPROPion (WELLBUTRIN XL) 150 MG 24 hr tablet TAKE 3 TABLETS DAILY 270 tablet 1  . carbidopa-levodopa (SINEMET IR) 25-100 MG tablet TAKE 2 TABLETS IN THE MORNING , 1 TABLET IN THE AFTERNOON AND 1 TABLET IN THE EVENING 360 tablet 1  . cholecalciferol (VITAMIN D) 1000 UNITS tablet Take 1,000 Units by mouth daily.    Marland Kitchen escitalopram (LEXAPRO) 10 MG tablet TAKE 1 TABLET DAILY 90 tablet 3  . memantine (NAMENDA) 10 MG tablet TAKE 1 TABLET TWICE A DAY 180 tablet 1  . naproxen sodium (ALEVE) 220 MG tablet Take 440 mg by mouth 2 (two) times daily as needed (for pain or headache).    Marland Kitchen omeprazole (PRILOSEC) 20 MG capsule Take 20 mg by mouth daily (Patient taking differently: Take 20 mg by mouth daily. ) 90 capsule 1  . pravastatin (PRAVACHOL) 20 MG tablet TAKE 1 TABLET EVERY EVENING (Patient taking differently: TAKE 20 MG BY MOUTH EVERY EVENING) 90 tablet 3  . protriptyline (VIVACTIL) 5 MG tablet 1 at bedtime 15 tablet 0  . temazepam (RESTORIL) 15 MG capsule 1 at bedtime for sleep 15 capsule 0    No facility-administered medications prior to visit.      Per HPI unless specifically indicated in ROS section below Review of Systems     Objective:    BP 116/62 (BP Location: Left Arm, Patient Position: Sitting, Cuff Size: Normal)   Pulse (!) 53   Temp 97.6 F (36.4 C) (Oral)   Ht 5\' 8"  (1.727 m)   Wt 182 lb 12 oz (82.9 kg)   SpO2 98%   BMI 27.79 kg/m   Wt Readings from Last 3 Encounters:  12/01/17 182 lb 12 oz (82.9 kg)  11/30/17 186 lb (84.4 kg)  10/18/17 185 lb (83.9 kg)    Physical Exam  Constitutional: He appears well-developed and well-nourished. No distress.  Musculoskeletal:  No midline lumbar or sacral spine tenderness  Skin: Capillary refill takes less than 2 seconds. There is erythema.     Small subcm sore R upper inner buttock without induration or drainage, tender Surrounding erythema at bilateral superior buttocks  Nursing note and vitals reviewed.  Lab Results  Component Value Date   CREATININE 1.15 10/11/2017   BUN 21 10/11/2017   NA 142 10/11/2017   K 4.0 10/11/2017   CL 106 10/11/2017   CO2 28 10/11/2017       Assessment & Plan:   Problem List Items Addressed This Visit  Skin sore - Primary    Sounds like this started as pustule that has drained with residual erosion of skin, no evidence of deeper infection such as abscess or boil. There is surrounding erythema spreading into left buttock so will Rx keflex 500mg  QID 1 wk course. rec barrier cream, frequent position changes, warm compresses. Red flags to seek further care reviewed as well.       Parkinson's disease ([place])    This limits mobility.       Lumbar stenosis with neurogenic claudication    This limits mobility          Meds ordered this encounter  Medications  . cephALEXin (KEFLEX) 500 MG capsule    Sig: Take 1 capsule (500 mg total) by mouth 4 (four) times daily.    Dispense:  28 capsule    Refill:  0   No orders of the defined types were placed in this  encounter.   Follow up plan: Return if symptoms worsen or fail to improve.  Ria Bush, MD

## 2017-12-01 NOTE — Patient Instructions (Addendum)
No abscess today, but you have sore of the skin with redness possible infection. Treat with antibiotic sent to pharmacy - keflex 500mg  four times a day for 1 week. May use warm compresses to the skin, use barrier cream to this area for extra protection. Work on frequent position changes to prevent spot from getting worse.  Watch for fever, worsening redness or spreading or area coming to a head.

## 2017-12-01 NOTE — Assessment & Plan Note (Signed)
Sounds like this started as pustule that has drained with residual erosion of skin, no evidence of deeper infection such as abscess or boil. There is surrounding erythema spreading into left buttock so will Rx keflex 500mg  QID 1 wk course. rec barrier cream, frequent position changes, warm compresses. Red flags to seek further care reviewed as well.

## 2017-12-01 NOTE — Assessment & Plan Note (Signed)
This limits mobility

## 2017-12-01 NOTE — Assessment & Plan Note (Signed)
This limits mobility.

## 2017-12-02 DIAGNOSIS — G4733 Obstructive sleep apnea (adult) (pediatric): Secondary | ICD-10-CM | POA: Diagnosis not present

## 2017-12-02 DIAGNOSIS — G20 Parkinson's disease: Secondary | ICD-10-CM | POA: Diagnosis not present

## 2017-12-04 ENCOUNTER — Ambulatory Visit: Payer: Self-pay | Admitting: Family Medicine

## 2017-12-05 ENCOUNTER — Ambulatory Visit: Payer: Medicare PPO

## 2017-12-05 ENCOUNTER — Other Ambulatory Visit: Payer: Self-pay

## 2017-12-05 DIAGNOSIS — R279 Unspecified lack of coordination: Secondary | ICD-10-CM

## 2017-12-05 DIAGNOSIS — R2681 Unsteadiness on feet: Secondary | ICD-10-CM | POA: Diagnosis not present

## 2017-12-05 DIAGNOSIS — R262 Difficulty in walking, not elsewhere classified: Secondary | ICD-10-CM

## 2017-12-05 DIAGNOSIS — R296 Repeated falls: Secondary | ICD-10-CM

## 2017-12-05 DIAGNOSIS — G20 Parkinson's disease: Secondary | ICD-10-CM

## 2017-12-05 DIAGNOSIS — R278 Other lack of coordination: Secondary | ICD-10-CM | POA: Diagnosis not present

## 2017-12-05 DIAGNOSIS — R29818 Other symptoms and signs involving the nervous system: Secondary | ICD-10-CM

## 2017-12-05 DIAGNOSIS — R2689 Other abnormalities of gait and mobility: Secondary | ICD-10-CM | POA: Diagnosis not present

## 2017-12-05 DIAGNOSIS — M6281 Muscle weakness (generalized): Secondary | ICD-10-CM | POA: Diagnosis not present

## 2017-12-05 DIAGNOSIS — R293 Abnormal posture: Secondary | ICD-10-CM | POA: Diagnosis not present

## 2017-12-05 DIAGNOSIS — R29898 Other symptoms and signs involving the musculoskeletal system: Secondary | ICD-10-CM

## 2017-12-05 NOTE — Therapy (Signed)
[place] MAIN [facility] SERVICES [address] [place], [place], [address] Phone: [phone]   Fax:  [phone]  Physical Therapy Treatment  Patient Details  Name: [name] MRN: [id number] Date of Birth: 23-Jul-1950 Referring Provider: Wells Guiles Tat   Encounter Date: 12/05/2017  PT End of Session - 12/05/17 1528    Visit Number  53    Number of Visits  65    Date for PT Re-Evaluation  01/02/18    Authorization Type  4/10 PN start 7/23    PT Start Time  1030    PT Stop Time  1115    PT Time Calculation (min)  45 min    Activity Tolerance  Patient tolerated treatment well;Other (comment)    Behavior During Therapy  WFL for tasks assessed/performed       Past Medical History:  Diagnosis Date  . Arthritis   . Bradycardia   . Cancer [facility]) 2013   skin cancer  . Depression    ptsd  . Dysrhythmia    chronic slow heart rate  . GERD (gastroesophageal reflux disease)   . Headache(784.0)    tension headaches non recent  . History of chicken pox   . History of kidney stones    passed  . Hypertension    treated with HCTZ  . Parkinson's disease ([place])    dx'ed 15 years ago  . PTSD (post-traumatic stress disorder)   . Shortness of breath dyspnea   . Sleep apnea    doesn't use C-pap  . Varicose veins     Past Surgical History:  Procedure Laterality Date  . CHOLECYSTECTOMY N/A 10/22/2014   Procedure: LAPAROSCOPIC CHOLECYSTECTOMY WITH INTRAOPERATIVE CHOLANGIOGRAM;  Surgeon: Dia Crawford III, MD;  Location: [facility] ORS;  Service: General;  Laterality: N/A;  . cyst removed      from lip as a child  . LUMBAR LAMINECTOMY/DECOMPRESSION MICRODISCECTOMY Bilateral 12/14/2012   Procedure: Bilateral lumbar three-four, four-five decompressive laminotomy/foraminotomy;  Surgeon: Charlie Pitter, MD;  Location: [place] NEURO ORS;  Service: Neurosurgery;  Laterality: Bilateral;  . PULSE GENERATOR IMPLANT Bilateral 12/13/2013   Procedure: Bilateral implantable pulse  generator placement;  Surgeon: Erline Levine, MD;  Location: [place] NEURO ORS;  Service: Neurosurgery;  Laterality: Bilateral;  Bilateral implantable pulse generator placement  . skin cancer removed     from ears,   12 lft arm  rt leg 15  . SUBTHALAMIC STIMULATOR BATTERY REPLACEMENT Bilateral 07/14/2017   Procedure: BILATERAL IMPLANTED PULSE GENERATOR CHANGE FOR DEEP BRAIN STIMULATOR;  Surgeon: Erline Levine, MD;  Location: [place];  Service: Neurosurgery;  Laterality: Bilateral;  . SUBTHALAMIC STIMULATOR INSERTION Bilateral 12/06/2013   Procedure: SUBTHALAMIC STIMULATOR INSERTION;  Surgeon: Erline Levine, MD;  Location: [place] NEURO ORS;  Service: Neurosurgery;  Laterality: Bilateral;  Bilateral deep brain stimulator placement    There were no vitals filed for this visit.  Subjective Assessment - 12/05/17 1523    Subjective  Pt reports falling backward this morning in w/c when sitting down; fortunately pt stopped half way backward onto laundry basket with laundry in it keeping pt free of injury. (spouse/caretaker verified). Continues with bruising on L foot from prior fall; denies pain.     Patient is accompained by:  Family member    Pertinent History  Patient referred for physical therapy due to wife's request for patient to get aquatic therapy to help with his conditioning. Pt is also complaining of chronic low back and R hip pain and frequent falls.  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 The last time he was in the water he was very uncomfortable because he didn't feel like he had any control. Pt has a history of 2 low back surgeries in August 2014 and February 2016. Pt and wife are unable to recall exactly what was performed but they know that he had stenosis. Surgeries were performed by Dr. Annette Stable in [place]. Pt reports that his low back and R hip pain are constant. He describes the pain in the back as "dull" and the pain in the hip as "sharp." For  both back and hip worst pain is 10/10, Best: 2/10, Present: 2/10. No particular episode of trauma to his low back or hip preceding the pain. Pt has a history of Parkinson's with a deep brain stimulator. He ambulates with a 4 wheeled walker called a U-step (automatic brakes, audio cues for stepping). He states that he falls frequently and reports that he has had approximately 40-50 falls in the last 6 months. No specific recent changes in his health.       Enters/exits via ramp with Min guard 3# ankle wts throughout session  Ambulation, blue dumbbells  Fwd 4 L   Side 4 L   Bkwd 4 L  High knee march 4 L  Bench  STS, no UE use 20x  STS with LLE, Min guard/A 12x  seated hip up and outs, B 2 x 10 each  Mod high plank, B hip ext 2 x 10 ea  Static side lunges with opposite side water push, rhythmically 30x ea                           PT Education - 12/05/17 1526    Education provided  Yes    Education Details  continued with core use for balance, weight shifting using counterbalance arms. Getting anti tippers on w/c to prevent falling backwards in chair.     Person(s) Educated  Patient;Spouse    Methods  Explanation;Demonstration    Comprehension  Verbalized understanding       PT Short Term Goals - 11/07/17 1223      PT SHORT TERM GOAL #1   Title  Pt will perform HEP with family's supervision, for improved balance, transfers, and gait.      Baseline  patient reports compliance, however not every day    Time  4    Period  Weeks    Status  Partially Met    Target Date  12/05/17      PT SHORT TERM GOAL #2   Title  Pt will perform 5x sit<>stand in less than or equal to 15 seconds for improved efficiency and safety with transfers.    Baseline  19 seconds ; 15 seconds    Time  2    Period  Weeks    Status  Achieved        PT Long Term Goals - 11/07/17 1130      PT LONG TERM GOAL #1   Title  Pt will decrease 5TSTS by at least 3 seconds in order to  demonstrate clinically significant improvement in LE strength     Baseline  02/08/17: 20.1 seconds 12/18: 17 seconds ; 09/14/17 =24.34 sec with assist due to poor static standing balance    Time  8    Period  Weeks    Status  Achieved      PT LONG TERM GOAL #2   Title  Pt will decrease TUG by at least 3.4s in order to demonstrate decreased fall risk     Baseline  02/08/17: 22.1s 12/18: 20 seconds 2/7: 19 seconds; 4/3: 23 seconds with walker, 09/14/17 15.12 sec 7/23: 15 seconds RW     Time  8    Period  Weeks    Status  Partially Met    Target Date  01/02/18      PT LONG TERM GOAL #3   Title  Pt will improve BERG by at least 3 points in order to demonstrate clinically significant improvement in balance.    Baseline  02/08/17: 30/56 12/18: 38/56      Time  8    Period  Weeks    Status  Achieved      PT LONG TERM GOAL #4   Title  Pt/wife will verbalize understanding of plans for continued community fitness upon D/C from PT including aquatic therapy program    Baseline  02/08/17: pt currently reports being more comfortable in the water    Time  8    Period  Weeks    Status  Partially Met    Target Date  01/02/18      PT LONG TERM GOAL #5   Title  Pt will decrease mODI scoreby at least 13 points in order demonstrate clinically significant reduction in pain/disability     Baseline  02/08/17: 54% 12/18: 46% 2/7: 42% 4/3; 50%, 09/14/17 42% 7/23: 42%    Time  8    Period  Weeks    Status  Partially Met    Target Date  01/02/18      PT LONG TERM GOAL #6   Title  Pt will improve BERG to 44/56  in order to demonstrate clinically significant improvement in balance for decreased fall risk    Baseline  12/18: 38/56 2/7: 41/56 4/3: 41/56, 09/14/17  19/56 7/23: 44/56    Time  8    Period  Weeks    Status  Partially Met    Target Date  01/02/18            Plan - 12/05/17 1528    Clinical Impression Statement  Min guard to Min A generally required throughout entering/exiting and during  session; at times Max A for balance. 3# ankle wts used throughout session for greater grounding. Encouraged LLE use for STS by removing RLE use, which was tolerated better this session than previous attempts. Rhythmic weight shifting to encourage balance.     Rehab Potential  Fair    PT Frequency  2x / week    PT Duration  8 weeks    PT Treatment/Interventions  ADLs/Self Care Home Management;Aquatic Therapy;Cryotherapy;Electrical Stimulation;Iontophoresis '4mg'$ /ml Dexamethasone;Moist Heat;Ultrasound;Contrast Bath;DME Instruction;Gait training;Stair training;Functional mobility training;Therapeutic activities;Therapeutic exercise;Balance training;Neuromuscular re-education;Patient/family education;Manual techniques;Wheelchair mobility training;Energy conservation;Passive range of motion    PT Next Visit Plan  progress balance, strength, aquatic therapy    PT Home Exercise Plan  None provided at this time until further assessed, wife reports pt does not perform current HEP from [facility]    Consulted and Agree with Plan of Care  Patient    Family Member Consulted  wife       Patient will benefit from skilled therapeutic intervention in order to improve the following deficits and impairments:  Abnormal gait, Decreased balance, Decreased coordination, Decreased mobility, Impaired tone, Postural dysfunction  Visit Diagnosis: Muscle weakness (  generalized)  Other lack of coordination  Repeated falls  Other abnormalities of gait and mobility  Unsteadiness on feet  Unspecified lack of coordination  Abnormal posture  Other symptoms and signs involving the nervous system  Difficulty walking  Parkinson's disease ([place])  Other symptoms and signs involving the musculoskeletal system     Problem List Patient Active Problem List   Diagnosis Date Noted  . Skin sore 12/01/2017  . Hematoma 10/18/2017  . Accidental fall 10/18/2017  . 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 Radicular pain in right  arm 10/09/2016  . GERD (gastroesophageal reflux disease) 07/31/2016  . Trochanteric bursitis 03/03/2016  . Left hand pain 10/30/2015  . Fracture, finger, multiple sites 10/30/2015  . Colon cancer screening 07/23/2015  . Healthcare maintenance 07/23/2015  . Gout 02/18/2015  . Depression 01/06/2015  . Irritation of eyelid 08/20/2014  . Dupuytren's contracture 08/20/2014  . Cough 07/21/2014  . Lumbar stenosis with neurogenic claudication 06/13/2014  . Preop cardiovascular exam 05/28/2014  . S/P deep brain stimulator placement 05/08/2014  . Joint pain 01/22/2014  . Medicare annual wellness visit, initial 01/19/2014  . Advance care planning 01/19/2014  . Parkinson's disease ([place]) 12/13/2013  . PTSD (post-traumatic stress disorder) 06/13/2013  . Erectile dysfunction 06/13/2013  . HLD (hyperlipidemia) 06/13/2013  . Hip pain 06/10/2013  . Pain in joint, shoulder region 06/10/2013  . Right leg swelling 06/03/2013  . Essential hypertension 06/03/2013  . Bradycardia by electrocardiogram 06/03/2013  . Obstructive sleep apnea 03/12/2013  . Spinal stenosis, lumbar region, with neurogenic claudication 12/14/2012    Larae Grooms 12/05/2017, 3:33 PM  [place] MAIN [facility] SERVICES [place], [place], [address] Phone: [phone]   Fax:  [phone]  Name: SANTIAGO STENZEL MRN: [id number] Date of Birth: November 07, 1950

## 2017-12-07 ENCOUNTER — Encounter: Payer: Self-pay | Admitting: Occupational Therapy

## 2017-12-07 ENCOUNTER — Ambulatory Visit: Payer: Medicare PPO

## 2017-12-07 ENCOUNTER — Ambulatory Visit: Payer: Medicare PPO | Admitting: Occupational Therapy

## 2017-12-07 DIAGNOSIS — R278 Other lack of coordination: Secondary | ICD-10-CM | POA: Diagnosis not present

## 2017-12-07 DIAGNOSIS — R2681 Unsteadiness on feet: Secondary | ICD-10-CM | POA: Diagnosis not present

## 2017-12-07 DIAGNOSIS — R2689 Other abnormalities of gait and mobility: Secondary | ICD-10-CM | POA: Diagnosis not present

## 2017-12-07 DIAGNOSIS — R296 Repeated falls: Secondary | ICD-10-CM | POA: Diagnosis not present

## 2017-12-07 DIAGNOSIS — M6281 Muscle weakness (generalized): Secondary | ICD-10-CM

## 2017-12-07 DIAGNOSIS — R293 Abnormal posture: Secondary | ICD-10-CM | POA: Diagnosis not present

## 2017-12-07 DIAGNOSIS — R29818 Other symptoms and signs involving the nervous system: Secondary | ICD-10-CM | POA: Diagnosis not present

## 2017-12-07 DIAGNOSIS — R262 Difficulty in walking, not elsewhere classified: Secondary | ICD-10-CM | POA: Diagnosis not present

## 2017-12-07 DIAGNOSIS — R279 Unspecified lack of coordination: Secondary | ICD-10-CM | POA: Diagnosis not present

## 2017-12-07 NOTE — Therapy (Addendum)
[place] MAIN [facility] SERVICES [address] [place], [place], [address] Phone: [phone]   Fax:  [phone]  Occupational Therapy Progress Note  Dates of reporting period  07/31/2017   to   12/07/2017  Patient Details  Name: [name] MRN: [id number] Date of Birth: [birth year] Referring Provider: Wells Guiles Tat   Encounter Date: 12/07/2017  OT End of Session - 12/07/17 0937    Visit Number  20    Number of Visits  24    Date for OT Re-Evaluation  01/16/18    Authorization Type  Visit 10 of 10 for progress reporting period starting 07/31/2017    OT Start Time  0930    OT Stop Time  1015    OT Time Calculation (min)  45 min    Activity Tolerance  Patient tolerated treatment well    Behavior During Therapy  [facility] for tasks assessed/performed       Past Medical History:  Diagnosis Date  . Arthritis   . Bradycardia   . Cancer [facility]) 2013   skin cancer  . Depression    ptsd  . Dysrhythmia    chronic slow heart rate  . GERD (gastroesophageal reflux disease)   . Headache(784.0)    tension headaches non recent  . History of chicken pox   . History of kidney stones    passed  . Hypertension    treated with HCTZ  . Parkinson's disease ([place])    dx'ed 15 years ago  . PTSD (post-traumatic stress disorder)   . Shortness of breath dyspnea   . Sleep apnea    doesn't use C-pap  . Varicose veins     Past Surgical History:  Procedure Laterality Date  . CHOLECYSTECTOMY N/A 10/22/2014   Procedure: LAPAROSCOPIC CHOLECYSTECTOMY WITH INTRAOPERATIVE CHOLANGIOGRAM;  Surgeon: Dia Crawford III, MD;  Location: [facility] ORS;  Service: General;  Laterality: N/A;  . cyst removed      from lip as a child  . LUMBAR LAMINECTOMY/DECOMPRESSION MICRODISCECTOMY Bilateral 12/14/2012   Procedure: Bilateral lumbar three-four, four-five decompressive laminotomy/foraminotomy;  Surgeon: Charlie Pitter, MD;  Location: [place] NEURO ORS;  Service: Neurosurgery;  Laterality:  Bilateral;  . PULSE GENERATOR IMPLANT Bilateral 12/13/2013   Procedure: Bilateral implantable pulse generator placement;  Surgeon: Erline Levine, MD;  Location: [place] NEURO ORS;  Service: Neurosurgery;  Laterality: Bilateral;  Bilateral implantable pulse generator placement  . skin cancer removed     from ears,   12 lft arm  rt leg 15  . SUBTHALAMIC STIMULATOR BATTERY REPLACEMENT Bilateral 07/14/2017   Procedure: BILATERAL IMPLANTED PULSE GENERATOR CHANGE FOR DEEP BRAIN STIMULATOR;  Surgeon: Erline Levine, MD;  Location: [place];  Service: Neurosurgery;  Laterality: Bilateral;  . SUBTHALAMIC STIMULATOR INSERTION Bilateral 12/06/2013   Procedure: SUBTHALAMIC STIMULATOR INSERTION;  Surgeon: Erline Levine, MD;  Location: [place] NEURO ORS;  Service: Neurosurgery;  Laterality: Bilateral;  Bilateral deep brain stimulator placement    There were no vitals filed for this visit.  Subjective Assessment - 12/07/17 0933    Subjective   Pt. reports having had a fall last week, and had to cancel his appointmnets.    Patient is accompained by:  Family member    Pertinent History  Pt. is a 67 y.o. mlae who has a history of Parkinson's Disease. Pt. has a Deep Brain Stimulator in place. Pt. has a history of Low Back Pain with 2 back surgeries in August 2014, and February 2016. Pt. has a history of  multiple frequent falls occurring at a rate of 6x's a week.     Limitations  Frequent falls, limited motor control, and [place].     Patient Stated Goals  To return home.    Currently in Pain?  No/denies      OT TREATMENT    Neuro muscular re-education:  Pt. worked on [facility] skills grasping small magnetic pegs. Pt. worked on reaching up to place them on a vertical white board placed at an elevated surface. Pt. dropped multiple pegs when disconnecting them from each other with resistance. Pt. worked on [employer] in [employer], and cursive form. Pt. was able to write more legibly with printed form. Pt. continues to  present with decreased legibly with cursive form. Pt. was able to maintain a mature tripod grasp on the pen without the task.                         OT Education - 12/07/17 0934    Education provided  Yes    Education Details  [facility], HEP for UE ther ex.    Person(s) Educated  Patient    Methods  Demonstration;Verbal cues;Explanation    Comprehension  Verbal cues required;Verbalized understanding          OT Long Term Goals - 12/07/17 0946      OT LONG TERM GOAL #1   Title  Pt. will improve Bilateral UE strength by 1 mm grade to assist with ADLs, and IADLs    Baseline  BUE strength: 4+/5 overall    Time  12    Period  Weeks    Target Date  01/16/18      OT LONG TERM GOAL #2   Title  Pt. will improve grip strength to be able to pour himself a beverage.    Baseline  Pt. continues to be unable to hold and pour a beevrage.    Time  12    Period  Weeks    Status  On-going    Target Date  01/16/18      OT LONG TERM GOAL #3   Title  Pt. will improve lateral pinch stength to be able to independently turn a key.     Baseline  Pt. continues to have difficulty holding, and turning  key.    Time  12    Period  Weeks    Status  On-going    Target Date  01/16/18      OT LONG TERM GOAL #4   Title  Pt. will improve Right hand [facility] skills to be able to independently grasp and use small objects needed for fixing car parts.    Baseline   45sec. on the 9 hole peg test.    Time  12    Period  Weeks    Status  On-going      OT LONG TERM GOAL #5   Title  Pt. will be demonstrate self-feeding with modified independence with minimal spillage.    Baseline  Pt. continues to require assistance, and has alot of spillage.    Period  Weeks    Status  On-going    Target Date  01/16/18      OT LONG TERM GOAL #7   Title  Pt. will write 1 sentence efficiently with 90% legibility.    Baseline  name only 75% legibility    Time  12    Period  Weeks    Status  On-going     Target Date  01/16/18      OT LONG TERM GOAL #8   Title  Pt. will improve typing speed, and accuracy to be able to to type emails efficently    Baseline  Typing speed: 6 wpm, Accuracy: 33% with moderate cues for hand placement    Time  12    Period  Weeks    Status  On-going    Target Date  01/16/18            Plan - 12/07/17 1601    Clinical Impression Statement  Pt. has had multiple falls, with the most recent fall occurring last week resulting resulting in the cancellation of his therapy appointments. Pt. continues to work on improving UE strength, and [facility] skills in preparation for writing, typing, ADSl, and IADLs.    Occupational performance deficits (Please refer to evaluation for details):  ADL's;IADL's    Rehab Potential  Good    Current Impairments/barriers affecting progress:  Positive Indicators: age, familiy support, motivation. Negative Indicators: Multiple comorbidities, and history of falls.    OT Frequency  2x / week    OT Duration  12 weeks    OT Treatment/Interventions  Self-care/ADL training;Therapeutic exercise;DME and/or AE instruction;Energy conservation;Neuromuscular education;Patient/family education;Therapeutic activities    Clinical Decision Making  Several treatment options, min-mod task modification necessary    Consulted and Agree with Plan of Care  Patient       Patient will benefit from skilled therapeutic intervention in order to improve the following deficits and impairments:  Pain, Decreased strength, Decreased coordination, Decreased balance, Impaired UE functional use, Decreased safety awareness, Decreased activity tolerance, Decreased cognition, Impaired flexibility, Impaired vision/preception  Visit Diagnosis: Muscle weakness (generalized)  Other lack of coordination    Problem List Patient Active Problem List   Diagnosis Date Noted  . Skin sore 12/01/2017  . Hematoma 10/18/2017  . Accidental fall 10/18/2017  . REM behavioral disorder  11/02/2016  . Radicular pain in right arm 10/09/2016  . GERD (gastroesophageal reflux disease) 07/31/2016  . Trochanteric bursitis 03/03/2016  . Left hand pain 10/30/2015  . Fracture, finger, multiple sites 10/30/2015  . Colon cancer screening 07/23/2015  . Healthcare maintenance 07/23/2015  . Gout 02/18/2015  . Depression 01/06/2015  . Irritation of eyelid 08/20/2014  . Dupuytren's contracture 08/20/2014  . Cough 07/21/2014  . Lumbar stenosis with neurogenic claudication 06/13/2014  . Preop cardiovascular exam 05/28/2014  . S/P deep brain stimulator placement 05/08/2014  . Joint pain 01/22/2014  . Medicare annual wellness visit, initial 01/19/2014  . Advance care planning 01/19/2014  . Parkinson's disease ([place]) 12/13/2013  . PTSD (post-traumatic stress disorder) 06/13/2013  . Erectile dysfunction 06/13/2013  . HLD (hyperlipidemia) 06/13/2013  . Hip pain 06/10/2013  . Pain in joint, shoulder region 06/10/2013  . Right leg swelling 06/03/2013  . Essential hypertension 06/03/2013  . Bradycardia by electrocardiogram 06/03/2013  . Obstructive sleep apnea 03/12/2013  . Spinal stenosis, lumbar region, with neurogenic claudication 12/14/2012    Harrel Carina 12/07/2017, 9:51 AM  [place] [address] [place], [place], [address] Phone: [phone]   Fax:  [phone]  Name: DEONTE OTTING MRN: [id number] Date of Birth: 1950/11/03

## 2017-12-07 NOTE — Addendum Note (Signed)
Addended by: Judene Companion on: 12/07/2017 12:22 PM   Modules accepted: Orders

## 2017-12-07 NOTE — Therapy (Signed)
[place] MAIN [facility] SERVICES [address] [place], [place], [address] Phone: [phone]   Fax:  [phone]  Physical Therapy Treatment  Physical Therapy Progress Note   Dates of reporting period  11/07/17   to    12/07/17  Patient Details  Name: [name] MRN: [id number] Date of Birth: Dec 26, 1950 Referring Provider: Wells Guiles Tat   Encounter Date: 12/07/2017  PT End of Session - 12/07/17 1109    Visit Number  54    Number of Visits  70    Date for PT Re-Evaluation  02/01/18    Authorization Type  5/10 PN start 8/22 (next 1/10)     PT Start Time  1016    PT Stop Time  1100    PT Time Calculation (min)  44 min    Activity Tolerance  Patient tolerated treatment well;Other (comment)    Behavior During Therapy  WFL for tasks assessed/performed       Past Medical History:  Diagnosis Date  . Arthritis   . Bradycardia   . Cancer [facility]) 2013   skin cancer  . Depression    ptsd  . Dysrhythmia    chronic slow heart rate  . GERD (gastroesophageal reflux disease)   . Headache(784.0)    tension headaches non recent  . History of chicken pox   . History of kidney stones    passed  . Hypertension    treated with HCTZ  . Parkinson's disease ([place])    dx'ed 15 years ago  . PTSD (post-traumatic stress disorder)   . Shortness of breath dyspnea   . Sleep apnea    doesn't use C-pap  . Varicose veins     Past Surgical History:  Procedure Laterality Date  . CHOLECYSTECTOMY N/A 10/22/2014   Procedure: LAPAROSCOPIC CHOLECYSTECTOMY WITH INTRAOPERATIVE CHOLANGIOGRAM;  Surgeon: Dia Crawford III, MD;  Location: [facility] ORS;  Service: General;  Laterality: N/A;  . cyst removed      from lip as a child  . LUMBAR LAMINECTOMY/DECOMPRESSION MICRODISCECTOMY Bilateral 12/14/2012   Procedure: Bilateral lumbar three-four, four-five decompressive laminotomy/foraminotomy;  Surgeon: Charlie Pitter, MD;  Location: [place] NEURO ORS;  Service: Neurosurgery;  Laterality:  Bilateral;  . PULSE GENERATOR IMPLANT Bilateral 12/13/2013   Procedure: Bilateral implantable pulse generator placement;  Surgeon: Erline Levine, MD;  Location: [place] NEURO ORS;  Service: Neurosurgery;  Laterality: Bilateral;  Bilateral implantable pulse generator placement  . skin cancer removed     from ears,   12 lft arm  rt leg 15  . SUBTHALAMIC STIMULATOR BATTERY REPLACEMENT Bilateral 07/14/2017   Procedure: BILATERAL IMPLANTED PULSE GENERATOR CHANGE FOR DEEP BRAIN STIMULATOR;  Surgeon: Erline Levine, MD;  Location: [place];  Service: Neurosurgery;  Laterality: Bilateral;  . SUBTHALAMIC STIMULATOR INSERTION Bilateral 12/06/2013   Procedure: SUBTHALAMIC STIMULATOR INSERTION;  Surgeon: Erline Levine, MD;  Location: [place] NEURO ORS;  Service: Neurosurgery;  Laterality: Bilateral;  Bilateral deep brain stimulator placement    There were no vitals filed for this visit.  Subjective Assessment - 12/07/17 1106    Subjective  Patient's wife reports that patient has not fallen as much this week due to being more compliant with staying in wheelchair after his fall 2 days ago. Discussed doctor's request for patient to be non ambulatory and how that will change plan of care.     Patient is accompained by:  Family member    Pertinent History  Patient referred for physical therapy due to wife's request for  patient to get aquatic therapy to help with his conditioning. Pt is also complaining of chronic low back and R hip pain and frequent falls. 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 The last time he was in the water he was very uncomfortable because he didn't feel like he had any control. Pt has a history of 2 low back surgeries in August 2014 and February 2016. Pt and wife are unable to recall exactly what was performed but they know that he had stenosis. Surgeries were performed by Dr. Annette Stable in [place]. Pt reports that his low back and R hip pain are constant.  He describes the pain in the back as "dull" and the pain in the hip as "sharp." For both back and hip worst pain is 10/10, Best: 2/10, Present: 2/10. No particular episode of trauma to his low back or hip preceding the pain. Pt has a history of Parkinson's with a deep brain stimulator. He ambulates with a 4 wheeled walker called a U-step (automatic brakes, audio cues for stepping). He states that he falls frequently and reports that he has had approximately 40-50 falls in the last 6 months. No specific recent changes in his health.     Limitations  Walking;Standing;Sitting;House hold activities    Patient Stated Goals  Improve comfort in the water, improve balance, decrease back and R hip pain    Currently in Pain?  No/denies     Patient educated on need to transition either to home health or primarily aquatic. Patient chose aquatic route with wife.   BERG: 31/56 ; decrease due to recent fall resulting in poor weight shift over L hip due to pain upon weightbearing.    Patient will reduce number of falls per week to <3 to reduce chance of injury. Patient currently falls about 6x/week.   Patient will successfully transfer from sit to stand and back to sit without loss of balance .  Strength goal  Excessive posterior trunk lean in standing and transfers  Sit to stand from plinth table practice, one hand on table one hand on walker. Forward trunk lean. 6x each trial, 2 trials.   3lb ankle weights: LAQ 10x 3 second holds ; 2 sets marches 10x each leg 3 second holds to PT hand for visual cueing. Crossing arms and cues for forward trunk lean . ' 2 sets  GTB abduction 20x seated   Patient requires verbal cueing for hold of task.                PT Education - 12/07/17 1109    Education provided  Yes    Education Details  change plan of care to be aquatic based.     Person(s) Educated  Patient;Spouse    Methods  Explanation;Demonstration;Verbal cues    Comprehension  Verbalized  understanding;Returned demonstration       PT Short Term Goals - 12/07/17 1115      PT SHORT TERM GOAL #1   Title  Pt will perform HEP with family's supervision, for improved balance, transfers, and gait.      Baseline  patient reports compliance, however not every day    Time  4    Period  Weeks    Status  Partially Met      PT SHORT TERM GOAL #2   Title  Pt will perform 5x sit<>stand in less than or equal to 15 seconds for improved efficiency and  safety with transfers.    Baseline  19 seconds ; 15 seconds    Time  2    Period  Weeks    Status  Achieved        PT Long Term Goals - 12/07/17 1115      PT LONG TERM GOAL #1   Title  Pt will decrease 5TSTS by at least 3 seconds in order to demonstrate clinically significant improvement in LE strength     Baseline  02/08/17: 20.1 seconds 12/18: 17 seconds ; 09/14/17 =24.34 sec with assist due to poor static standing balance    Time  8    Period  Weeks    Status  Achieved      PT LONG TERM GOAL #2   Title  Pt will decrease TUG by at least 3.4s in order to demonstrate decreased fall risk     Baseline  02/08/17: 22.1s 12/18: 20 seconds 2/7: 19 seconds; 4/3: 23 seconds with walker, 09/14/17 15.12 sec 7/23: 15 seconds RW 8/22: no longer ambulatory    Time  8    Period  Weeks    Status  Deferred      PT LONG TERM GOAL #3   Title  Pt will improve BERG by at least 3 points in order to demonstrate clinically significant improvement in balance.    Baseline  02/08/17: 30/56 12/18: 38/56      Time  8    Period  Weeks    Status  Achieved      PT LONG TERM GOAL #4   Title  Pt/wife will verbalize understanding of plans for continued community fitness upon D/C from PT including aquatic therapy program    Baseline  02/08/17: pt currently reports being more comfortable in the water    Time  8    Period  Weeks    Status  Partially Met    Target Date  02/01/18      PT LONG TERM GOAL #5   Title  Pt will decrease mODI scoreby at least 13  points in order demonstrate clinically significant reduction in pain/disability     Baseline  02/08/17: 54% 12/18: 46% 2/7: 42% 4/3; 50%, 09/14/17 42% 7/23: 42% 8/22 8/22:  31/56    Time  8    Period  Weeks    Status  Partially Met    Target Date  02/01/18      Additional Long Term Goals   Additional Long Term Goals  Yes      PT LONG TERM GOAL #6   Title  Pt will improve BERG to 44/56  in order to demonstrate clinically significant improvement in balance for decreased fall risk    Baseline  12/18: 38/56 2/7: 41/56 4/3: 41/56, 09/14/17  19/56 7/23: 44/56    Time  8    Period  Weeks    Status  Partially Met    Target Date  02/01/18      PT LONG TERM GOAL #7   Title  Patient will reduce number of falls per week to <3 to reduce chance of injury and decrease frequent LOB.     Baseline  8/22: 6x/week     Time  8    Period  Weeks    Status  New    Target Date  02/01/18      PT LONG TERM GOAL #8   Title  Patient will increase functional strength to 4+/5 to improve functional mobility and safety with transfers.  Baseline  8/22: 4-/5 gross     Time  8    Period  Weeks    Status  New    Target Date  02/01/18            Plan - 12/07/17 1114    Clinical Impression Statement  Due to patient's high fall risk, reached out to doctor about participation in [place]. Spoke with doctor who relayed that patient is supposed to be non ambulatory due to his falls. Due to this change in status patient will require new goals and plan of care. Patient will benefit from aquatic therapy to continue progressing core and LE strength and stability for decreased fall risk. Conversed with aquatic therapist about stopping ambulation as well as new POC, upon which she was in agreement. Patient will continue to benefit from skilled physical therapy to improve stability and strength to reduce fall risk.      Rehab Potential  Fair    PT Frequency  2x / week    PT Duration  8 weeks    PT Treatment/Interventions   ADLs/Self Care Home Management;Aquatic Therapy;Cryotherapy;Electrical Stimulation;Iontophoresis '4mg'$ /ml Dexamethasone;Moist Heat;Ultrasound;Contrast Bath;DME Instruction;Gait training;Stair training;Functional mobility training;Therapeutic activities;Therapeutic exercise;Balance training;Neuromuscular re-education;Patient/family education;Manual techniques;Wheelchair mobility training;Energy conservation;Passive range of motion    PT Next Visit Plan  progress balance, strength, aquatic therapy    PT Home Exercise Plan  seated strengthening    Consulted and Agree with Plan of Care  Patient    Family Member Consulted  wife       Patient will benefit from skilled therapeutic intervention in order to improve the following deficits and impairments:  Abnormal gait, Decreased balance, Decreased coordination, Decreased mobility, Impaired tone, Postural dysfunction, Decreased strength, Decreased safety awareness, Improper body mechanics, Impaired flexibility  Visit Diagnosis: Muscle weakness (generalized)  Other lack of coordination  Repeated falls  Other abnormalities of gait and mobility     Problem List Patient Active Problem List   Diagnosis Date Noted  . Skin sore 12/01/2017  . Hematoma 10/18/2017  . Accidental fall 10/18/2017  . 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 Radicular pain in right arm 10/09/2016  . GERD (gastroesophageal reflux disease) 07/31/2016  . Trochanteric bursitis 03/03/2016  . Left hand pain 10/30/2015  . Fracture, finger, multiple sites 10/30/2015  . Colon cancer screening 07/23/2015  . Healthcare maintenance 07/23/2015  . Gout 02/18/2015  . Depression 01/06/2015  . Irritation of eyelid 08/20/2014  . Dupuytren's contracture 08/20/2014  . Cough 07/21/2014  . Lumbar stenosis with neurogenic claudication 06/13/2014  . Preop cardiovascular exam 05/28/2014  . S/P deep brain stimulator placement 05/08/2014  . Joint pain 01/22/2014  . Medicare annual wellness  visit, initial 01/19/2014  . Advance care planning 01/19/2014  . Parkinson's disease ([place]) 12/13/2013  . PTSD (post-traumatic stress disorder) 06/13/2013  . Erectile dysfunction 06/13/2013  . HLD (hyperlipidemia) 06/13/2013  . Hip pain 06/10/2013  . Pain in joint, shoulder region 06/10/2013  . Right leg swelling 06/03/2013  . Essential hypertension 06/03/2013  . Bradycardia by electrocardiogram 06/03/2013  . Obstructive sleep apnea 03/12/2013  . Spinal stenosis, lumbar region, with neurogenic claudication 12/14/2012   Janna Arch, PT, DPT   12/07/2017, 12:19 PM  [place] MAIN [facility] SERVICES [address] [place], [place], [address] Phone: [phone]   Fax:  [phone]  Name: CLETUS PARIS MRN: [id number] Date of Birth: 11/19/1950

## 2017-12-11 ENCOUNTER — Ambulatory Visit (INDEPENDENT_AMBULATORY_CARE_PROVIDER_SITE_OTHER): Payer: Medicare PPO | Admitting: Psychology

## 2017-12-11 DIAGNOSIS — F331 Major depressive disorder, recurrent, moderate: Secondary | ICD-10-CM | POA: Diagnosis not present

## 2017-12-11 DIAGNOSIS — F4323 Adjustment disorder with mixed anxiety and depressed mood: Secondary | ICD-10-CM | POA: Diagnosis not present

## 2017-12-12 ENCOUNTER — Other Ambulatory Visit: Payer: Self-pay

## 2017-12-12 ENCOUNTER — Ambulatory Visit: Payer: Medicare PPO

## 2017-12-12 DIAGNOSIS — R278 Other lack of coordination: Secondary | ICD-10-CM | POA: Diagnosis not present

## 2017-12-12 DIAGNOSIS — R2681 Unsteadiness on feet: Secondary | ICD-10-CM

## 2017-12-12 DIAGNOSIS — M6281 Muscle weakness (generalized): Secondary | ICD-10-CM | POA: Diagnosis not present

## 2017-12-12 DIAGNOSIS — R279 Unspecified lack of coordination: Secondary | ICD-10-CM

## 2017-12-12 DIAGNOSIS — R2689 Other abnormalities of gait and mobility: Secondary | ICD-10-CM

## 2017-12-12 DIAGNOSIS — G20 Parkinson's disease: Secondary | ICD-10-CM

## 2017-12-12 DIAGNOSIS — R293 Abnormal posture: Secondary | ICD-10-CM

## 2017-12-12 DIAGNOSIS — R262 Difficulty in walking, not elsewhere classified: Secondary | ICD-10-CM

## 2017-12-12 DIAGNOSIS — R296 Repeated falls: Secondary | ICD-10-CM | POA: Diagnosis not present

## 2017-12-12 DIAGNOSIS — R29818 Other symptoms and signs involving the nervous system: Secondary | ICD-10-CM | POA: Diagnosis not present

## 2017-12-12 DIAGNOSIS — G20A1 Parkinson's disease without dyskinesia, without mention of fluctuations: Secondary | ICD-10-CM

## 2017-12-12 DIAGNOSIS — R29898 Other symptoms and signs involving the musculoskeletal system: Secondary | ICD-10-CM

## 2017-12-12 NOTE — Therapy (Signed)
[place] MAIN [facility] SERVICES [address] [place], [place], [address] Phone: [phone]   Fax:  [phone]  Physical Therapy Treatment  Patient Details  Name: [name] MRN: [id number] Date of Birth: September 18, 1950 Referring Provider: Wells Guiles Tat   Encounter Date: 12/12/2017  PT End of Session - 12/12/17 1442    Visit Number  55    Number of Visits  70    Date for PT Re-Evaluation  02/01/18    PT Start Time  1020    PT Stop Time  1115    PT Time Calculation (min)  55 min    Activity Tolerance  Patient tolerated treatment well;Other (comment)    Behavior During Therapy  WFL for tasks assessed/performed       Past Medical History:  Diagnosis Date  . Arthritis   . Bradycardia   . Cancer [facility]) 2013   skin cancer  . Depression    ptsd  . Dysrhythmia    chronic slow heart rate  . GERD (gastroesophageal reflux disease)   . Headache(784.0)    tension headaches non recent  . History of chicken pox   . History of kidney stones    passed  . Hypertension    treated with HCTZ  . Parkinson's disease ([place])    dx'ed 15 years ago  . PTSD (post-traumatic stress disorder)   . Shortness of breath dyspnea   . Sleep apnea    doesn't use C-pap  . Varicose veins     Past Surgical History:  Procedure Laterality Date  . CHOLECYSTECTOMY N/A 10/22/2014   Procedure: LAPAROSCOPIC CHOLECYSTECTOMY WITH INTRAOPERATIVE CHOLANGIOGRAM;  Surgeon: Dia Crawford III, MD;  Location: [facility] ORS;  Service: General;  Laterality: N/A;  . cyst removed      from lip as a child  . LUMBAR LAMINECTOMY/DECOMPRESSION MICRODISCECTOMY Bilateral 12/14/2012   Procedure: Bilateral lumbar three-four, four-five decompressive laminotomy/foraminotomy;  Surgeon: Charlie Pitter, MD;  Location: [place] NEURO ORS;  Service: Neurosurgery;  Laterality: Bilateral;  . PULSE GENERATOR IMPLANT Bilateral 12/13/2013   Procedure: Bilateral implantable pulse generator placement;  Surgeon: Erline Levine,  MD;  Location: [place] NEURO ORS;  Service: Neurosurgery;  Laterality: Bilateral;  Bilateral implantable pulse generator placement  . skin cancer removed     from ears,   12 lft arm  rt leg 15  . SUBTHALAMIC STIMULATOR BATTERY REPLACEMENT Bilateral 07/14/2017   Procedure: BILATERAL IMPLANTED PULSE GENERATOR CHANGE FOR DEEP BRAIN STIMULATOR;  Surgeon: Erline Levine, MD;  Location: [place];  Service: Neurosurgery;  Laterality: Bilateral;  . SUBTHALAMIC STIMULATOR INSERTION Bilateral 12/06/2013   Procedure: SUBTHALAMIC STIMULATOR INSERTION;  Surgeon: Erline Levine, MD;  Location: [place] NEURO ORS;  Service: Neurosurgery;  Laterality: Bilateral;  Bilateral deep brain stimulator placement    There were no vitals filed for this visit.  Subjective Assessment - 12/12/17 1437    Subjective  Pt accompanied by caretaker. Pt/caretaker deny any new falls. Pt denies pain. Discussion with pt regarding new plan of care for acquatic based PT sessions. Pt in agreement with current plan. Pt and caretaker note pt's improved balance ability when participating in aquatic sessions and pt feels better able to move during daily functions after aquatic session.     Patient is accompained by:  Family member    Pertinent History  Patient referred for physical therapy due to wife's request for patient to get aquatic therapy to help with his conditioning. Pt is also complaining of chronic low back and R  hip pain and frequent falls. 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 The last time he was in the water he was very uncomfortable because he didn't feel like he had any control. Pt has a history of 2 low back surgeries in August 2014 and February 2016. Pt and wife are unable to recall exactly what was performed but they know that he had stenosis. Surgeries were performed by Dr. Annette Stable in [place]. Pt reports that his low back and R hip pain are constant. He describes the pain in the back  as "dull" and the pain in the hip as "sharp." For both back and hip worst pain is 10/10, Best: 2/10, Present: 2/10. No particular episode of trauma to his low back or hip preceding the pain. Pt has a history of Parkinson's with a deep brain stimulator. He ambulates with a 4 wheeled walker called a U-step (automatic brakes, audio cues for stepping). He states that he falls frequently and reports that he has had approximately 40-50 falls in the last 6 months. No specific recent changes in his health.       Enters/exits via ramp with use of rail and MIN GUARD 3# ankle wts donned for entire session  Fwd walk 4 L Bkwd walk 2 L  Stand B hip ext, 20x ea  Side step 2 L  Squat with noodle under buttocks, 20x (focus on hold in squat and slower return to stand)  Side step with squat 2 L  High knee march, noodle for UE support, 4 L  Abdominal stabilization in wall sit (shoulders half under water)    Noodle press to thighs, 20x  Speed, 30x ea   Sh horizontal abd/add   Sh flex/ext   Sh abd/add  Bench  STS on L (R foot/ankle resting on therapist foot), 20x  Static alternating side lunges with focus on weight shifting side to side, 20x ea                          PT Education - 12/12/17 1441    Education provided  Yes    Education Details  aquatic based PT and review of goals with focus on core/LE strength and balance    Person(s) Educated  Patient    Methods  Explanation    Comprehension  Verbalized understanding       PT Short Term Goals - 12/07/17 1115      PT SHORT TERM GOAL #1   Title  Pt will perform HEP with family's supervision, for improved balance, transfers, and gait.      Baseline  patient reports compliance, however not every day    Time  4    Period  Weeks    Status  Partially Met      PT SHORT TERM GOAL #2   Title  Pt will perform 5x sit<>stand in less than or equal to 15 seconds for improved efficiency and safety with transfers.    Baseline   19 seconds ; 15 seconds    Time  2    Period  Weeks    Status  Achieved        PT Long Term Goals - 12/07/17 1115      PT LONG TERM GOAL #1   Title  Pt will decrease 5TSTS by at least 3 seconds in order to demonstrate clinically significant improvement in LE strength  Baseline  02/08/17: 20.1 seconds 12/18: 17 seconds ; 09/14/17 =24.34 sec with assist due to poor static standing balance    Time  8    Period  Weeks    Status  Achieved      PT LONG TERM GOAL #2   Title  Pt will decrease TUG by at least 3.4s in order to demonstrate decreased fall risk     Baseline  02/08/17: 22.1s 12/18: 20 seconds 2/7: 19 seconds; 4/3: 23 seconds with walker, 09/14/17 15.12 sec 7/23: 15 seconds RW 8/22: no longer ambulatory    Time  8    Period  Weeks    Status  Deferred      PT LONG TERM GOAL #3   Title  Pt will improve BERG by at least 3 points in order to demonstrate clinically significant improvement in balance.    Baseline  02/08/17: 30/56 12/18: 38/56      Time  8    Period  Weeks    Status  Achieved      PT LONG TERM GOAL #4   Title  Pt/wife will verbalize understanding of plans for continued community fitness upon D/C from PT including aquatic therapy program    Baseline  02/08/17: pt currently reports being more comfortable in the water    Time  8    Period  Weeks    Status  Partially Met    Target Date  02/01/18      PT LONG TERM GOAL #5   Title  Pt will decrease mODI scoreby at least 13 points in order demonstrate clinically significant reduction in pain/disability     Baseline  02/08/17: 54% 12/18: 46% 2/7: 42% 4/3; 50%, 09/14/17 42% 7/23: 42% 8/22 8/22:  31/56    Time  8    Period  Weeks    Status  Partially Met    Target Date  02/01/18      Additional Long Term Goals   Additional Long Term Goals  Yes      PT LONG TERM GOAL #6   Title  Pt will improve BERG to 44/56  in order to demonstrate clinically significant improvement in balance for decreased fall risk    Baseline   12/18: 38/56 2/7: 41/56 4/3: 41/56, 09/14/17  19/56 7/23: 44/56    Time  8    Period  Weeks    Status  Partially Met    Target Date  02/01/18      PT LONG TERM GOAL #7   Title  Patient will reduce number of falls per week to <3 to reduce chance of injury and decrease frequent LOB.     Baseline  8/22: 6x/week     Time  8    Period  Weeks    Status  New    Target Date  02/01/18      PT LONG TERM GOAL #8   Title  Patient will increase functional strength to 4+/5 to improve functional mobility and safety with transfers.     Baseline  8/22: 4-/5 gross     Time  8    Period  Weeks    Status  New    Target Date  02/01/18            Plan - 12/12/17 1442    Clinical Impression Statement  Discussed aquatic based approach to continue to improve LE especially LLE strength as well as core strength with a continued focus on balance to improve  safety and ability with function. Discussed understanding of decreased land ambulation time for safety in/out of the home; however, discussed importance of keeping strong for transfers and sitting/standing balance all while allowing time out of the wheelchair, which has numerous benefits. Pt in agreement and pleased. Pt is able to safely ambulate with Min guard assist in and out of pool via ramp using 1 or 2 hands on the rail as well. Will continue this method of entry as along as pt able to safely perform with therapist. 3# wts continued to be used for the purpose of grounding feet in the pool. Pt responded well today to alternating walking/balance activities with more static strengthening activities based on pt response. (when pt has difficulty correcting from posterior lean, activiity changed then returned to ambulation once COG demonstrates improvement. Pt showed improvement with continued work with STS from bench primarily with LLE today. Continue aquatic based PT program.     Rehab Potential  Fair    PT Frequency  2x / week    PT Duration  8 weeks    PT  Treatment/Interventions  ADLs/Self Care Home Management;Aquatic Therapy;Cryotherapy;Electrical Stimulation;Iontophoresis 75m/ml Dexamethasone;Moist Heat;Ultrasound;Contrast Bath;DME Instruction;Gait training;Stair training;Functional mobility training;Therapeutic activities;Therapeutic exercise;Balance training;Neuromuscular re-education;Patient/family education;Manual techniques;Wheelchair mobility training;Energy conservation;Passive range of motion    PT Next Visit Plan  progress balance, strength, aquatic therapy    PT Home Exercise Plan  seated strengthening    Consulted and Agree with Plan of Care  Patient    Family Member Consulted  wife       Patient will benefit from skilled therapeutic intervention in order to improve the following deficits and impairments:  Abnormal gait, Decreased balance, Decreased coordination, Decreased mobility, Impaired tone, Postural dysfunction, Decreased strength, Decreased safety awareness, Improper body mechanics, Impaired flexibility  Visit Diagnosis: Muscle weakness (generalized)  Other lack of coordination  Repeated falls  Other abnormalities of gait and mobility  Unsteadiness on feet  Unspecified lack of coordination  Abnormal posture  Other symptoms and signs involving the nervous system  Difficulty walking  Parkinson's disease (H[facility])  Other symptoms and signs involving the musculoskeletal system     Problem List Patient Active Problem List   Diagnosis Date Noted  . Skin sore 12/01/2017  . Hematoma 10/18/2017  . Accidental fall 10/18/2017  . 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 Radicular pain in right arm 10/09/2016  . GERD (gastroesophageal reflux disease) 07/31/2016  . Trochanteric bursitis 03/03/2016  . Left hand pain 10/30/2015  . Fracture, finger, multiple sites 10/30/2015  . Colon cancer screening 07/23/2015  . Healthcare maintenance 07/23/2015  . Gout 02/18/2015  . Depression 01/06/2015  . Irritation of eyelid  08/20/2014  . Dupuytren's contracture 08/20/2014  . Cough 07/21/2014  . Lumbar stenosis with neurogenic claudication 06/13/2014  . Preop cardiovascular exam 05/28/2014  . S/P deep brain stimulator placement 05/08/2014  . Joint pain 01/22/2014  . Medicare annual wellness visit, initial 01/19/2014  . Advance care planning 01/19/2014  . [place]'s [place] 12/13/2013  . PTSD (post-traumatic stress disorder) 06/13/2013  . Erectile dysfunction 06/13/2013  . HLD (hyperlipidemia) 06/13/2013  . Hip pain 06/10/2013  . Pain in joint, shoulder region 06/10/2013  . Right leg swelling 06/03/2013  . Essential hypertension 06/03/2013  . Bradycardia by electrocardiogram 06/03/2013  . Obstructive sleep apnea 03/12/2013  . Spinal stenosis, lumbar region, with neurogenic claudication 12/14/2012    HLarae Grooms8/27/2019, 2:53 PM  [place]  [place], [address] Phone: [phone]   Fax:  [phone]  Name: [name] MRN: [id number] Date of Birth: 1950/06/19

## 2017-12-14 ENCOUNTER — Ambulatory Visit: Payer: Medicare PPO | Admitting: Occupational Therapy

## 2017-12-14 ENCOUNTER — Ambulatory Visit: Payer: Self-pay

## 2017-12-14 ENCOUNTER — Encounter: Payer: Self-pay | Admitting: Occupational Therapy

## 2017-12-14 DIAGNOSIS — R293 Abnormal posture: Secondary | ICD-10-CM | POA: Diagnosis not present

## 2017-12-14 DIAGNOSIS — R296 Repeated falls: Secondary | ICD-10-CM | POA: Diagnosis not present

## 2017-12-14 DIAGNOSIS — R29818 Other symptoms and signs involving the nervous system: Secondary | ICD-10-CM | POA: Diagnosis not present

## 2017-12-14 DIAGNOSIS — R2689 Other abnormalities of gait and mobility: Secondary | ICD-10-CM | POA: Diagnosis not present

## 2017-12-14 DIAGNOSIS — R278 Other lack of coordination: Secondary | ICD-10-CM | POA: Diagnosis not present

## 2017-12-14 DIAGNOSIS — M6281 Muscle weakness (generalized): Secondary | ICD-10-CM

## 2017-12-14 DIAGNOSIS — R279 Unspecified lack of coordination: Secondary | ICD-10-CM | POA: Diagnosis not present

## 2017-12-14 DIAGNOSIS — R2681 Unsteadiness on feet: Secondary | ICD-10-CM | POA: Diagnosis not present

## 2017-12-14 DIAGNOSIS — R262 Difficulty in walking, not elsewhere classified: Secondary | ICD-10-CM | POA: Diagnosis not present

## 2017-12-14 NOTE — Therapy (Signed)
[place] MAIN [facility] SERVICES [address] [place], [place], [address] Phone: [phone]   Fax:  [phone]  Occupational Therapy Treatment  Patient Details  Name: [name] MRN: [id number] Date of Birth: 05-13-1950 Referring Provider: Wells Guiles Tat   Encounter Date: 12/14/2017  OT End of Session - 12/14/17 0909    Visit Number  22    Number of Visits  24    Date for OT Re-Evaluation  01/16/18    Authorization Type  Visit 1 of 10 for progress reporting period starting 12/14/2017    OT Start Time  0900    OT Stop Time  0945    OT Time Calculation (min)  45 min    Activity Tolerance  Patient tolerated treatment well    Behavior During Therapy  [facility] for tasks assessed/performed       Past Medical History:  Diagnosis Date  . Arthritis   . Bradycardia   . Cancer [facility]) 2013   skin cancer  . Depression    ptsd  . Dysrhythmia    chronic slow heart rate  . GERD (gastroesophageal reflux disease)   . Headache(784.0)    tension headaches non recent  . History of chicken pox   . History of kidney stones    passed  . Hypertension    treated with HCTZ  . Parkinson's disease ([place])    dx'ed 15 years ago  . PTSD (post-traumatic stress disorder)   . Shortness of breath dyspnea   . Sleep apnea    doesn't use C-pap  . Varicose veins     Past Surgical History:  Procedure Laterality Date  . CHOLECYSTECTOMY N/A 10/22/2014   Procedure: LAPAROSCOPIC CHOLECYSTECTOMY WITH INTRAOPERATIVE CHOLANGIOGRAM;  Surgeon: Dia Crawford III, MD;  Location: [facility] ORS;  Service: General;  Laterality: N/A;  . cyst removed      from lip as a child  . LUMBAR LAMINECTOMY/DECOMPRESSION MICRODISCECTOMY Bilateral 12/14/2012   Procedure: Bilateral lumbar three-four, four-five decompressive laminotomy/foraminotomy;  Surgeon: Charlie Pitter, MD;  Location: [place] NEURO ORS;  Service: Neurosurgery;  Laterality: Bilateral;  . PULSE GENERATOR IMPLANT Bilateral 12/13/2013    Procedure: Bilateral implantable pulse generator placement;  Surgeon: Erline Levine, MD;  Location: [place] NEURO ORS;  Service: Neurosurgery;  Laterality: Bilateral;  Bilateral implantable pulse generator placement  . skin cancer removed     from ears,   12 lft arm  rt leg 15  . SUBTHALAMIC STIMULATOR BATTERY REPLACEMENT Bilateral 07/14/2017   Procedure: BILATERAL IMPLANTED PULSE GENERATOR CHANGE FOR DEEP BRAIN STIMULATOR;  Surgeon: Erline Levine, MD;  Location: [place];  Service: Neurosurgery;  Laterality: Bilateral;  . SUBTHALAMIC STIMULATOR INSERTION Bilateral 12/06/2013   Procedure: SUBTHALAMIC STIMULATOR INSERTION;  Surgeon: Erline Levine, MD;  Location: [place] NEURO ORS;  Service: Neurosurgery;  Laterality: Bilateral;  Bilateral deep brain stimulator placement    There were no vitals filed for this visit.  Subjective Assessment - 12/14/17 0906    Subjective   Pt. reports that he is doing well today.    Patient is accompained by:  Family member    Pertinent History  Pt. is a 67 y.o. mlae who has a history of Parkinson's Disease. Pt. has a Deep Brain Stimulator in place. Pt. has a history of Low Back Pain with 2 back surgeries in August 2014, and February 2016. Pt. has a history of multiple frequent falls occurring at a rate of 6x's a week.     Limitations  Frequent falls, limited  motor control, and [place].     Patient Stated Goals  To return home.    Currently in Pain?  No/denies      OT TREATMENT    Neuro muscular re-education:  Pt. performed [facility] skills training to improve speed and dexterity needed for ADL tasks and writing. Pt. demonstrated grasping 1 inch sticks,  inch cylindrical collars, and  inch flat washers on the [employer] pegboard. Pt. performed grasping each item with his 2nd digit and thumb. Pt. Required the small objects to be stabilized using a magnetic bowl when grasping them. Pt. Dropped multiple objects when not using the magnetic dish for stabilization.   Therapeutic  Exercise:  Selfcare:  Pt. Worked on typing tasks typing simple 2 and 3 letter words with 75% accuracy, and cues.                        OT Education - 12/14/17 0908    Education provided  Yes    Education Details  [place], HEP for UE ther ex.    Person(s) Educated  Patient    Methods  Demonstration;Verbal cues;Explanation    Comprehension  Verbal cues required;Verbalized understanding          OT Long Term Goals - 12/07/17 0946      OT LONG TERM GOAL #1   Title  Pt. will improve Bilateral UE strength by 1 mm grade to assist with ADLs, and IADLs    Baseline  BUE strength: 4+/5 overall    Time  12    Period  Weeks    Target Date  01/16/18      OT LONG TERM GOAL #2   Title  Pt. will improve grip strength to be able to pour himself a beverage.    Baseline  Pt. continues to be unable to hold and pour a beevrage.    Time  12    Period  Weeks    Status  On-going    Target Date  01/16/18      OT LONG TERM GOAL #3   Title  Pt. will improve lateral pinch stength to be able to independently turn a key.     Baseline  Pt. continues to have difficulty holding, and turning  key.    Time  12    Period  Weeks    Status  On-going    Target Date  01/16/18      OT LONG TERM GOAL #4   Title  Pt. will improve Right hand [facility] skills to be able to independently grasp and use small objects needed for fixing car parts.    Baseline   45sec. on the 9 hole peg test.    Time  12    Period  Weeks    Status  On-going      OT LONG TERM GOAL #5   Title  Pt. will be demonstrate self-feeding with modified independence with minimal spillage.    Baseline  Pt. continues to require assistance, and has alot of spillage.    Period  Weeks    Status  On-going    Target Date  01/16/18      OT LONG TERM GOAL #7   Title  Pt. will write 1 sentence efficiently with 90% legibility.    Baseline  name only 75% legibility    Time  12    Period  Weeks    Status  On-going    Target Date   01/16/18  OT LONG TERM GOAL #8   Title  Pt. will improve typing speed, and accuracy to be able to to type emails efficently    Baseline  Typing speed: 6 wpm, Accuracy: 33% with moderate cues for hand placement    Time  12    Period  Weeks    Status  On-going    Target Date  01/16/18            Plan - 12/14/17 0911    Clinical Impression Statement  Pt. has been taking aquatic therapy. Pt. reports having had multiple falls since being seen last for OT services. Pt. presents with impaired [facility] skills. Pt. continues to have difficulty with typing tasks. Pt. continues to work on improving UE strength, and [facility] skills for improved ADL tasks, typing, and writing.     Occupational performance deficits (Please refer to evaluation for details):  ADL's;IADL's    Rehab Potential  Good    Current Impairments/barriers affecting progress:  Positive Indicators: age, familiy support, motivation. Negative Indicators: Multiple comorbidities, and history of falls.    OT Frequency  2x / week    OT Duration  12 weeks    OT Treatment/Interventions  Self-care/ADL training;Therapeutic exercise;DME and/or AE instruction;Energy conservation;Neuromuscular education;Patient/family education;Therapeutic activities    Clinical Decision Making  Several treatment options, min-mod task modification necessary    OT Home Exercise Plan  HEP for using rubber bands for strengthening at home and work on more handwriting.    Consulted and Agree with Plan of Care  Patient       Patient will benefit from skilled therapeutic intervention in order to improve the following deficits and impairments:  Pain, Decreased strength, Decreased coordination, Decreased balance, Impaired UE functional use, Decreased safety awareness, Decreased activity tolerance, Decreased cognition, Impaired flexibility, Impaired vision/preception  Visit Diagnosis: Muscle weakness (generalized)  Other lack of coordination    Problem List Patient  Active Problem List   Diagnosis Date Noted  . Skin sore 12/01/2017  . Hematoma 10/18/2017  . Accidental fall 10/18/2017  . 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 Radicular pain in right arm 10/09/2016  . GERD (gastroesophageal reflux disease) 07/31/2016  . Trochanteric bursitis 03/03/2016  . Left hand pain 10/30/2015  . Fracture, finger, multiple sites 10/30/2015  . Colon cancer screening 07/23/2015  . Healthcare maintenance 07/23/2015  . Gout 02/18/2015  . Depression 01/06/2015  . Irritation of eyelid 08/20/2014  . Dupuytren's contracture 08/20/2014  . Cough 07/21/2014  . Lumbar stenosis with neurogenic claudication 06/13/2014  . Preop cardiovascular exam 05/28/2014  . S/P deep brain stimulator placement 05/08/2014  . Joint pain 01/22/2014  . Medicare annual wellness visit, initial 01/19/2014  . Advance care planning 01/19/2014  . Parkinson's disease ([place]) 12/13/2013  . PTSD (post-traumatic stress disorder) 06/13/2013  . Erectile dysfunction 06/13/2013  . HLD (hyperlipidemia) 06/13/2013  . Hip pain 06/10/2013  . Pain in joint, shoulder region 06/10/2013  . Right leg swelling 06/03/2013  . Essential hypertension 06/03/2013  . Bradycardia by electrocardiogram 06/03/2013  . Obstructive sleep apnea 03/12/2013  . Spinal stenosis, lumbar region, with neurogenic claudication 12/14/2012    Harrel Carina, MS, OTR/L 12/14/2017, 9:56 AM  [place] MAIN [facility] SERVICES [address] [place], [place], [address] Phone: [phone]   Fax:  [phone]  Name: [name] MRN: [id number] Date of Birth: 08-Dec-1950

## 2017-12-15 NOTE — Therapy (Signed)
[place] MAIN [facility] SERVICES [address] [place], [place], [address] Phone: [phone]   Fax:  [phone]  Occupational Therapy Treatment  Patient Details  Name: [name] MRN: [id number] Date of Birth: [birth year] Referring Provider: Wells Guiles Tat   Encounter Date: 11/30/2017    Past Medical History:  Diagnosis Date  . Arthritis   . Bradycardia   . Cancer [facility]) 2013   skin cancer  . Depression    ptsd  . Dysrhythmia    chronic slow heart rate  . GERD (gastroesophageal reflux disease)   . Headache(784.0)    tension headaches non recent  . History of chicken pox   . History of kidney stones    passed  . Hypertension    treated with HCTZ  . Parkinson's disease ([place])    dx'ed 15 years ago  . PTSD (post-traumatic stress disorder)   . Shortness of breath dyspnea   . Sleep apnea    doesn't use C-pap  . Varicose veins     Past Surgical History:  Procedure Laterality Date  . CHOLECYSTECTOMY N/A 10/22/2014   Procedure: LAPAROSCOPIC CHOLECYSTECTOMY WITH INTRAOPERATIVE CHOLANGIOGRAM;  Surgeon: Dia Crawford III, MD;  Location: [facility] ORS;  Service: General;  Laterality: N/A;  . cyst removed      from lip as a child  . LUMBAR LAMINECTOMY/DECOMPRESSION MICRODISCECTOMY Bilateral 12/14/2012   Procedure: Bilateral lumbar three-four, four-five decompressive laminotomy/foraminotomy;  Surgeon: Charlie Pitter, MD;  Location: [place] NEURO ORS;  Service: Neurosurgery;  Laterality: Bilateral;  . PULSE GENERATOR IMPLANT Bilateral 12/13/2013   Procedure: Bilateral implantable pulse generator placement;  Surgeon: Erline Levine, MD;  Location: [place] NEURO ORS;  Service: Neurosurgery;  Laterality: Bilateral;  Bilateral implantable pulse generator placement  . skin cancer removed     from ears,   12 lft arm  rt leg 15  . SUBTHALAMIC STIMULATOR BATTERY REPLACEMENT Bilateral 07/14/2017   Procedure: BILATERAL IMPLANTED PULSE GENERATOR CHANGE FOR DEEP BRAIN STIMULATOR;   Surgeon: Erline Levine, MD;  Location: [place];  Service: Neurosurgery;  Laterality: Bilateral;  . SUBTHALAMIC STIMULATOR INSERTION Bilateral 12/06/2013   Procedure: SUBTHALAMIC STIMULATOR INSERTION;  Surgeon: Erline Levine, MD;  Location: [place] NEURO ORS;  Service: Neurosurgery;  Laterality: Bilateral;  Bilateral deep brain stimulator placement    Pt. Arrived for Therapy however was not seen secondary to having to go to the ER during PT.                            OT Long Term Goals - 12/07/17 0946      OT LONG TERM GOAL #1   Title  Pt. will improve Bilateral UE strength by 1 mm grade to assist with ADLs, and IADLs    Baseline  BUE strength: 4+/5 overall    Time  12    Period  Weeks    Target Date  01/16/18      OT LONG TERM GOAL #2   Title  Pt. will improve grip strength to be able to pour himself a beverage.    Baseline  Pt. continues to be unable to hold and pour a beevrage.    Time  12    Period  Weeks    Status  On-going    Target Date  01/16/18      OT LONG TERM GOAL #3   Title  Pt. will improve lateral pinch stength to be able to independently turn a key.  Baseline  Pt. continues to have difficulty holding, and turning  key.    Time  12    Period  Weeks    Status  On-going    Target Date  01/16/18      OT LONG TERM GOAL #4   Title  Pt. will improve Right hand [facility] skills to be able to independently grasp and use small objects needed for fixing car parts.    Baseline   45sec. on the 9 hole peg test.    Time  12    Period  Weeks    Status  On-going      OT LONG TERM GOAL #5   Title  Pt. will be demonstrate self-feeding with modified independence with minimal spillage.    Baseline  Pt. continues to require assistance, and has alot of spillage.    Period  Weeks    Status  On-going    Target Date  01/16/18      OT LONG TERM GOAL #7   Title  Pt. will write 1 sentence efficiently with 90% legibility.    Baseline  name only 75% legibility    Time   12    Period  Weeks    Status  On-going    Target Date  01/16/18      OT LONG TERM GOAL #8   Title  Pt. will improve typing speed, and accuracy to be able to to type emails efficently    Baseline  Typing speed: 6 wpm, Accuracy: 33% with moderate cues for hand placement    Time  12    Period  Weeks    Status  On-going    Target Date  01/16/18              Patient will benefit from skilled therapeutic intervention in order to improve the following deficits and impairments:     Visit Diagnosis: Other lack of coordination  Muscle weakness (generalized)    Problem List Patient Active Problem List   Diagnosis Date Noted  . Skin sore 12/01/2017  . Hematoma 10/18/2017  . Accidental fall 10/18/2017  . 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 Radicular pain in right arm 10/09/2016  . GERD (gastroesophageal reflux disease) 07/31/2016  . Trochanteric bursitis 03/03/2016  . Left hand pain 10/30/2015  . Fracture, finger, multiple sites 10/30/2015  . Colon cancer screening 07/23/2015  . Healthcare maintenance 07/23/2015  . Gout 02/18/2015  . Depression 01/06/2015  . Irritation of eyelid 08/20/2014  . Dupuytren's contracture 08/20/2014  . Cough 07/21/2014  . Lumbar stenosis with neurogenic claudication 06/13/2014  . Preop cardiovascular exam 05/28/2014  . S/P deep brain stimulator placement 05/08/2014  . Joint pain 01/22/2014  . Medicare annual wellness visit, initial 01/19/2014  . Advance care planning 01/19/2014  . Parkinson's disease ([place]) 12/13/2013  . PTSD (post-traumatic stress disorder) 06/13/2013  . Erectile dysfunction 06/13/2013  . HLD (hyperlipidemia) 06/13/2013  . Hip pain 06/10/2013  . Pain in joint, shoulder region 06/10/2013  . Right leg swelling 06/03/2013  . Essential hypertension 06/03/2013  . Bradycardia by electrocardiogram 06/03/2013  . Obstructive sleep apnea 03/12/2013  . Spinal stenosis, lumbar region, with neurogenic claudication 12/14/2012     Harrel Carina, MS, OTR/L 12/15/2017, 12:34 PM  [place] MAIN [facility] SERVICES [address] [place], [place], [address] Phone: [phone]   Fax:  [phone]  Name: CORDERA STINEMAN MRN: [id number] Date of Birth: 1950/11/24

## 2017-12-19 ENCOUNTER — Ambulatory Visit: Payer: Medicare PPO | Attending: Neurology

## 2017-12-19 DIAGNOSIS — R262 Difficulty in walking, not elsewhere classified: Secondary | ICD-10-CM | POA: Insufficient documentation

## 2017-12-19 DIAGNOSIS — R29818 Other symptoms and signs involving the nervous system: Secondary | ICD-10-CM | POA: Insufficient documentation

## 2017-12-19 DIAGNOSIS — R2681 Unsteadiness on feet: Secondary | ICD-10-CM | POA: Insufficient documentation

## 2017-12-19 DIAGNOSIS — R2689 Other abnormalities of gait and mobility: Secondary | ICD-10-CM | POA: Insufficient documentation

## 2017-12-19 DIAGNOSIS — R293 Abnormal posture: Secondary | ICD-10-CM | POA: Insufficient documentation

## 2017-12-19 DIAGNOSIS — M48062 Spinal stenosis, lumbar region with neurogenic claudication: Secondary | ICD-10-CM | POA: Insufficient documentation

## 2017-12-19 DIAGNOSIS — R29898 Other symptoms and signs involving the musculoskeletal system: Secondary | ICD-10-CM | POA: Insufficient documentation

## 2017-12-19 DIAGNOSIS — G20 Parkinson's disease: Secondary | ICD-10-CM | POA: Insufficient documentation

## 2017-12-19 DIAGNOSIS — R296 Repeated falls: Secondary | ICD-10-CM | POA: Insufficient documentation

## 2017-12-19 DIAGNOSIS — R278 Other lack of coordination: Secondary | ICD-10-CM | POA: Insufficient documentation

## 2017-12-19 DIAGNOSIS — M545 Low back pain: Secondary | ICD-10-CM | POA: Insufficient documentation

## 2017-12-19 DIAGNOSIS — R279 Unspecified lack of coordination: Secondary | ICD-10-CM | POA: Insufficient documentation

## 2017-12-19 DIAGNOSIS — M6281 Muscle weakness (generalized): Secondary | ICD-10-CM | POA: Insufficient documentation

## 2017-12-19 DIAGNOSIS — G8929 Other chronic pain: Secondary | ICD-10-CM | POA: Insufficient documentation

## 2017-12-20 ENCOUNTER — Ambulatory Visit: Payer: Medicare PPO | Admitting: Occupational Therapy

## 2017-12-21 ENCOUNTER — Ambulatory Visit: Payer: Medicare PPO

## 2017-12-22 ENCOUNTER — Other Ambulatory Visit: Payer: Self-pay | Admitting: Neurology

## 2017-12-26 ENCOUNTER — Ambulatory Visit: Payer: Medicare PPO

## 2017-12-28 ENCOUNTER — Ambulatory Visit: Payer: Medicare PPO

## 2017-12-29 ENCOUNTER — Encounter: Payer: Medicare PPO | Admitting: Occupational Therapy

## 2017-12-29 ENCOUNTER — Ambulatory Visit: Payer: Self-pay

## 2018-01-02 ENCOUNTER — Ambulatory Visit: Payer: Medicare PPO

## 2018-01-02 DIAGNOSIS — G4733 Obstructive sleep apnea (adult) (pediatric): Secondary | ICD-10-CM | POA: Diagnosis not present

## 2018-01-02 DIAGNOSIS — G20 Parkinson's disease: Secondary | ICD-10-CM | POA: Diagnosis not present

## 2018-01-03 ENCOUNTER — Other Ambulatory Visit: Payer: Self-pay | Admitting: Family Medicine

## 2018-01-04 ENCOUNTER — Encounter: Payer: Self-pay | Admitting: Occupational Therapy

## 2018-01-04 ENCOUNTER — Ambulatory Visit: Payer: Medicare PPO

## 2018-01-04 ENCOUNTER — Ambulatory Visit: Payer: Self-pay

## 2018-01-08 ENCOUNTER — Ambulatory Visit (INDEPENDENT_AMBULATORY_CARE_PROVIDER_SITE_OTHER): Payer: Medicare PPO | Admitting: Psychology

## 2018-01-08 DIAGNOSIS — F4323 Adjustment disorder with mixed anxiety and depressed mood: Secondary | ICD-10-CM | POA: Diagnosis not present

## 2018-01-08 DIAGNOSIS — F331 Major depressive disorder, recurrent, moderate: Secondary | ICD-10-CM

## 2018-01-09 ENCOUNTER — Other Ambulatory Visit: Payer: Self-pay

## 2018-01-09 ENCOUNTER — Ambulatory Visit: Payer: Medicare PPO

## 2018-01-09 DIAGNOSIS — R29898 Other symptoms and signs involving the musculoskeletal system: Secondary | ICD-10-CM | POA: Diagnosis present

## 2018-01-09 DIAGNOSIS — R278 Other lack of coordination: Secondary | ICD-10-CM

## 2018-01-09 DIAGNOSIS — M545 Low back pain, unspecified: Secondary | ICD-10-CM

## 2018-01-09 DIAGNOSIS — G20 Parkinson's disease: Secondary | ICD-10-CM | POA: Diagnosis present

## 2018-01-09 DIAGNOSIS — R296 Repeated falls: Secondary | ICD-10-CM

## 2018-01-09 DIAGNOSIS — M6281 Muscle weakness (generalized): Secondary | ICD-10-CM | POA: Diagnosis not present

## 2018-01-09 DIAGNOSIS — R279 Unspecified lack of coordination: Secondary | ICD-10-CM | POA: Diagnosis not present

## 2018-01-09 DIAGNOSIS — R262 Difficulty in walking, not elsewhere classified: Secondary | ICD-10-CM | POA: Diagnosis not present

## 2018-01-09 DIAGNOSIS — R293 Abnormal posture: Secondary | ICD-10-CM

## 2018-01-09 DIAGNOSIS — R2689 Other abnormalities of gait and mobility: Secondary | ICD-10-CM | POA: Diagnosis not present

## 2018-01-09 DIAGNOSIS — G8929 Other chronic pain: Secondary | ICD-10-CM

## 2018-01-09 DIAGNOSIS — R29818 Other symptoms and signs involving the nervous system: Secondary | ICD-10-CM

## 2018-01-09 DIAGNOSIS — R2681 Unsteadiness on feet: Secondary | ICD-10-CM | POA: Diagnosis not present

## 2018-01-09 DIAGNOSIS — M48062 Spinal stenosis, lumbar region with neurogenic claudication: Secondary | ICD-10-CM | POA: Diagnosis present

## 2018-01-09 DIAGNOSIS — G20A1 Parkinson's disease without dyskinesia, without mention of fluctuations: Secondary | ICD-10-CM

## 2018-01-09 NOTE — Therapy (Signed)
[place] MAIN [facility] SERVICES [address] [place], [place], [address] Phone: [phone]   Fax:  [phone]  Physical Therapy Treatment  Patient Details  Name: [name] MRN: [id number] Date of Birth: Apr 20, 1950 Referring Provider: Wells Guiles Tat   Encounter Date: 01/09/2018  PT End of Session - 01/09/18 1145    Visit Number  56    Number of Visits  70    Date for PT Re-Evaluation  02/01/18    PT Start Time  0945    PT Stop Time  1030    PT Time Calculation (min)  45 min    Activity Tolerance  Patient tolerated treatment well;Other (comment)    Behavior During Therapy  WFL for tasks assessed/performed       Past Medical History:  Diagnosis Date  . Arthritis   . Bradycardia   . Cancer [facility]) 2013   skin cancer  . Depression    ptsd  . Dysrhythmia    chronic slow heart rate  . GERD (gastroesophageal reflux disease)   . Headache(784.0)    tension headaches non recent  . History of chicken pox   . History of kidney stones    passed  . Hypertension    treated with HCTZ  . Parkinson's disease ([place])    dx'ed 15 years ago  . PTSD (post-traumatic stress disorder)   . Shortness of breath dyspnea   . Sleep apnea    doesn't use C-pap  . Varicose veins     Past Surgical History:  Procedure Laterality Date  . CHOLECYSTECTOMY N/A 10/22/2014   Procedure: LAPAROSCOPIC CHOLECYSTECTOMY WITH INTRAOPERATIVE CHOLANGIOGRAM;  Surgeon: Dia Crawford III, MD;  Location: [facility] ORS;  Service: General;  Laterality: N/A;  . cyst removed      from lip as a child  . LUMBAR LAMINECTOMY/DECOMPRESSION MICRODISCECTOMY Bilateral 12/14/2012   Procedure: Bilateral lumbar three-four, four-five decompressive laminotomy/foraminotomy;  Surgeon: Charlie Pitter, MD;  Location: [place] NEURO ORS;  Service: Neurosurgery;  Laterality: Bilateral;  . PULSE GENERATOR IMPLANT Bilateral 12/13/2013   Procedure: Bilateral implantable pulse generator placement;  Surgeon: Erline Levine,  MD;  Location: [place] NEURO ORS;  Service: Neurosurgery;  Laterality: Bilateral;  Bilateral implantable pulse generator placement  . skin cancer removed     from ears,   12 lft arm  rt leg 15  . SUBTHALAMIC STIMULATOR BATTERY REPLACEMENT Bilateral 07/14/2017   Procedure: BILATERAL IMPLANTED PULSE GENERATOR CHANGE FOR DEEP BRAIN STIMULATOR;  Surgeon: Erline Levine, MD;  Location: [place];  Service: Neurosurgery;  Laterality: Bilateral;  . SUBTHALAMIC STIMULATOR INSERTION Bilateral 12/06/2013   Procedure: SUBTHALAMIC STIMULATOR INSERTION;  Surgeon: Erline Levine, MD;  Location: [place] NEURO ORS;  Service: Neurosurgery;  Laterality: Bilateral;  Bilateral deep brain stimulator placement    There were no vitals filed for this visit.  Subjective Assessment - 01/09/18 1141    Subjective  Pt returns to aquatic therapy after several weeks due to being in respite care while spouse was away. Pt/spouse note pt will have a new aide to assist pt at home. Pt has several abrasions/bruises due to a recent fall. Spouse has abraised skin appropriately covered with waterproof/fully sealed bandages. Pt denies pain.     Patient is accompained by:  Family member    Pertinent History  Patient referred for physical therapy due to wife's request for patient to get aquatic therapy to help with his conditioning. Pt is also complaining of chronic low back and R hip pain and frequent  falls. 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 The last time he was in the water he was very uncomfortable because he didn't feel like he had any control. Pt has a history of 2 low back surgeries in August 2014 and February 2016. Pt and wife are unable to recall exactly what was performed but they know that he had stenosis. Surgeries were performed by Dr. Annette Stable in [place]. Pt reports that his low back and R hip pain are constant. He describes the pain in the back as "dull" and the pain in the hip as  "sharp." For both back and hip worst pain is 10/10, Best: 2/10, Present: 2/10. No particular episode of trauma to his low back or hip preceding the pain. Pt has a history of Parkinson's with a deep brain stimulator. He ambulates with a 4 wheeled walker called a U-step (automatic brakes, audio cues for stepping). He states that he falls frequently and reports that he has had approximately 40-50 falls in the last 6 months. No specific recent changes in his health.       Enters/exits via ramp with Min guard assist; sidestepping with 2 hands on rail 3# ankle wts throughout session  Ambulation, blue dumbbells and assist as needed  Fwd 4 L   Side 2 L   Side with squat, 2 L  High knee march, 2 L  Backward, 2 L  Bench, no UE assist  STS, 20x  STS on L; RLE elevated from floor and maintained in ext, 15x (increased assist required)  Modified plank (increased difficulty and time to instruct/correct)  Holds, 4 x 30 sec  Hip ext, B 10x  Stand balance with isometric resisted trunk rotation in neutral, 3 min  Knee drivers, (challenging)  B 20x                           PT Education - 01/09/18 1144    Education provided  Yes    Education Details  continued on stopping activity and regaining balance before trying to push through and continue activity. Core stabilization. Modified high plank form    Person(s) Educated  Patient    Methods  Explanation;Demonstration    Comprehension  Verbalized understanding;Verbal cues required;Tactile cues required;Need further instruction       PT Short Term Goals - 12/07/17 1115      PT SHORT TERM GOAL #1   Title  Pt will perform HEP with family's supervision, for improved balance, transfers, and gait.      Baseline  patient reports compliance, however not every day    Time  4    Period  Weeks    Status  Partially Met      PT SHORT TERM GOAL #2   Title  Pt will perform 5x sit<>stand in less than or equal to 15 seconds for  improved efficiency and safety with transfers.    Baseline  19 seconds ; 15 seconds    Time  2    Period  Weeks    Status  Achieved        PT Long Term Goals - 12/07/17 1115      PT LONG TERM GOAL #1   Title  Pt will decrease 5TSTS by at least 3 seconds in order to demonstrate clinically significant improvement in LE strength     Baseline  02/08/17: 20.1 seconds 12/18: 17 seconds ;  09/14/17 =24.34 sec with assist due to poor static standing balance    Time  8    Period  Weeks    Status  Achieved      PT LONG TERM GOAL #2   Title  Pt will decrease TUG by at least 3.4s in order to demonstrate decreased fall risk     Baseline  02/08/17: 22.1s 12/18: 20 seconds 2/7: 19 seconds; 4/3: 23 seconds with walker, 09/14/17 15.12 sec 7/23: 15 seconds RW 8/22: no longer ambulatory    Time  8    Period  Weeks    Status  Deferred      PT LONG TERM GOAL #3   Title  Pt will improve BERG by at least 3 points in order to demonstrate clinically significant improvement in balance.    Baseline  02/08/17: 30/56 12/18: 38/56      Time  8    Period  Weeks    Status  Achieved      PT LONG TERM GOAL #4   Title  Pt/wife will verbalize understanding of plans for continued community fitness upon D/C from PT including aquatic therapy program    Baseline  02/08/17: pt currently reports being more comfortable in the water    Time  8    Period  Weeks    Status  Partially Met    Target Date  02/01/18      PT LONG TERM GOAL #5   Title  Pt will decrease mODI scoreby at least 13 points in order demonstrate clinically significant reduction in pain/disability     Baseline  02/08/17: 54% 12/18: 46% 2/7: 42% 4/3; 50%, 09/14/17 42% 7/23: 42% 8/22 8/22:  31/56    Time  8    Period  Weeks    Status  Partially Met    Target Date  02/01/18      Additional Long Term Goals   Additional Long Term Goals  Yes      PT LONG TERM GOAL #6   Title  Pt will improve BERG to 44/56  in order to demonstrate clinically significant  improvement in balance for decreased fall risk    Baseline  12/18: 38/56 2/7: 41/56 4/3: 41/56, 09/14/17  19/56 7/23: 44/56    Time  8    Period  Weeks    Status  Partially Met    Target Date  02/01/18      PT LONG TERM GOAL #7   Title  Patient will reduce number of falls per week to <3 to reduce chance of injury and decrease frequent LOB.     Baseline  8/22: 6x/week     Time  8    Period  Weeks    Status  New    Target Date  02/01/18      PT LONG TERM GOAL #8   Title  Patient will increase functional strength to 4+/5 to improve functional mobility and safety with transfers.     Baseline  8/22: 4-/5 gross     Time  8    Period  Weeks    Status  New    Target Date  02/01/18            Plan - 01/09/18 1145    Clinical Impression Statement  Pt required Min to Max A throughout session today. Noting a decline in ability to balance/find balance with activity as well as decreased LLE control/strength more than likely due to time away from therapy/activity,  as pt notes he mainly sat at respite care and did not eat well.  Feel pt will improve again with consistent therapy schedule moving forward. 3# wts used again this session to assist pt in grounding LEs on pool floor.     Rehab Potential  Fair    PT Frequency  2x / week    PT Duration  8 weeks    PT Treatment/Interventions  ADLs/Self Care Home Management;Aquatic Therapy;Cryotherapy;Electrical Stimulation;Iontophoresis '4mg'$ /ml Dexamethasone;Moist Heat;Ultrasound;Contrast Bath;DME Instruction;Gait training;Stair training;Functional mobility training;Therapeutic activities;Therapeutic exercise;Balance training;Neuromuscular re-education;Patient/family education;Manual techniques;Wheelchair mobility training;Energy conservation;Passive range of motion    PT Next Visit Plan  progress balance, strength, aquatic therapy    PT Home Exercise Plan  seated strengthening    Consulted and Agree with Plan of Care  Patient    Family Member Consulted   wife       Patient will benefit from skilled therapeutic intervention in order to improve the following deficits and impairments:  Abnormal gait, Decreased balance, Decreased coordination, Decreased mobility, Impaired tone, Postural dysfunction, Decreased strength, Decreased safety awareness, Improper body mechanics, Impaired flexibility  Visit Diagnosis: Muscle weakness (generalized)  Other lack of coordination  Repeated falls  Other abnormalities of gait and mobility  Unsteadiness on feet  Unspecified lack of coordination  Abnormal posture  Other symptoms and signs involving the nervous system  Difficulty walking  Parkinson's disease ([facility])  Other symptoms and signs involving the musculoskeletal system  Chronic midline low back pain without sciatica  Spinal stenosis, lumbar region, with neurogenic claudication     Problem List Patient Active Problem List   Diagnosis Date Noted  . Skin sore 12/01/2017  . Hematoma 10/18/2017  . Accidental fall 10/18/2017  . 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 Radicular pain in right arm 10/09/2016  . GERD (gastroesophageal reflux disease) 07/31/2016  . Trochanteric bursitis 03/03/2016  . Left hand pain 10/30/2015  . Fracture, finger, multiple sites 10/30/2015  . Colon cancer screening 07/23/2015  . Healthcare maintenance 07/23/2015  . Gout 02/18/2015  . Depression 01/06/2015  . Irritation of eyelid 08/20/2014  . Dupuytren's contracture 08/20/2014  . Cough 07/21/2014  . Lumbar stenosis with neurogenic claudication 06/13/2014  . Preop cardiovascular exam 05/28/2014  . S/P deep brain stimulator placement 05/08/2014  . Joint pain 01/22/2014  . Medicare annual wellness visit, initial 01/19/2014  . Advance care planning 01/19/2014  . Parkinson's disease ([place]) 12/13/2013  . PTSD (post-traumatic stress disorder) 06/13/2013  . Erectile dysfunction 06/13/2013  . HLD (hyperlipidemia) 06/13/2013  . Hip pain 06/10/2013  . Pain in  joint, shoulder region 06/10/2013  . Right leg swelling 06/03/2013  . Essential hypertension 06/03/2013  . Bradycardia by electrocardiogram 06/03/2013  . Obstructive sleep apnea 03/12/2013  . Spinal stenosis, lumbar region, with neurogenic claudication 12/14/2012    Larae Grooms 01/09/2018, 11:50 AM  [place], [place], [address] Phone: [phone]   Fax:  [phone]  Name: [name] MRN: [id number] Date of Birth: 08-Nov-1950

## 2018-01-11 ENCOUNTER — Ambulatory Visit: Payer: Medicare PPO | Admitting: Occupational Therapy

## 2018-01-11 ENCOUNTER — Ambulatory Visit: Payer: Medicare PPO

## 2018-01-11 ENCOUNTER — Encounter: Payer: Self-pay | Admitting: Occupational Therapy

## 2018-01-11 ENCOUNTER — Ambulatory Visit: Payer: Self-pay

## 2018-01-11 DIAGNOSIS — R296 Repeated falls: Secondary | ICD-10-CM

## 2018-01-11 DIAGNOSIS — R2689 Other abnormalities of gait and mobility: Secondary | ICD-10-CM | POA: Diagnosis not present

## 2018-01-11 DIAGNOSIS — M6281 Muscle weakness (generalized): Secondary | ICD-10-CM

## 2018-01-11 DIAGNOSIS — R2681 Unsteadiness on feet: Secondary | ICD-10-CM | POA: Diagnosis not present

## 2018-01-11 DIAGNOSIS — R278 Other lack of coordination: Secondary | ICD-10-CM

## 2018-01-11 DIAGNOSIS — R29818 Other symptoms and signs involving the nervous system: Secondary | ICD-10-CM | POA: Diagnosis not present

## 2018-01-11 DIAGNOSIS — R279 Unspecified lack of coordination: Secondary | ICD-10-CM | POA: Diagnosis not present

## 2018-01-11 DIAGNOSIS — R293 Abnormal posture: Secondary | ICD-10-CM | POA: Diagnosis not present

## 2018-01-11 DIAGNOSIS — R262 Difficulty in walking, not elsewhere classified: Secondary | ICD-10-CM | POA: Diagnosis not present

## 2018-01-11 NOTE — Therapy (Signed)
[place] MAIN [facility] ([facility] SERVICES [address] [place], [place], [address] Phone: [phone]   Fax:  [phone]  Physical Therapy Treatment Physical Therapy Progress Note   Dates of reporting period  12/07/17  to   01/11/18   Patient Details  Name: [name] MRN: [id number] Date of Birth: 1951/01/17 Referring Provider (PT): Alonza Bogus   Encounter Date: 01/11/2018  PT End of Session - 01/11/18 1308    Visit Number  12    Number of Visits  73    Date for PT Re-Evaluation  03/08/18    Authorization Type  1/10 start 9/26    Activity Tolerance  Patient tolerated treatment well;Other (comment)    Behavior During Therapy  WFL for tasks assessed/performed       Past Medical History:  Diagnosis Date  . Arthritis   . Bradycardia   . Cancer [facility]) 2013   skin cancer  . Depression    ptsd  . Dysrhythmia    chronic slow heart rate  . GERD (gastroesophageal reflux disease)   . Headache(784.0)    tension headaches non recent  . History of chicken pox   . History of kidney stones    passed  . Hypertension    treated with HCTZ  . Parkinson's disease ([place])    dx'ed 15 years ago  . PTSD (post-traumatic stress disorder)   . Shortness of breath dyspnea   . Sleep apnea    doesn't use C-pap  . Varicose veins     Past Surgical History:  Procedure Laterality Date  . CHOLECYSTECTOMY N/A 10/22/2014   Procedure: LAPAROSCOPIC CHOLECYSTECTOMY WITH INTRAOPERATIVE CHOLANGIOGRAM;  Surgeon: Dia Crawford III, MD;  Location: [facility] ORS;  Service: General;  Laterality: N/A;  . cyst removed      from lip as a child  . LUMBAR LAMINECTOMY/DECOMPRESSION MICRODISCECTOMY Bilateral 12/14/2012   Procedure: Bilateral lumbar three-four, four-five decompressive laminotomy/foraminotomy;  Surgeon: Charlie Pitter, MD;  Location: [place] NEURO ORS;  Service: Neurosurgery;  Laterality: Bilateral;  . PULSE GENERATOR IMPLANT Bilateral 12/13/2013   Procedure: Bilateral implantable  pulse generator placement;  Surgeon: Erline Levine, MD;  Location: [place] NEURO ORS;  Service: Neurosurgery;  Laterality: Bilateral;  Bilateral implantable pulse generator placement  . skin cancer removed     from ears,   12 lft arm  rt leg 15  . SUBTHALAMIC STIMULATOR BATTERY REPLACEMENT Bilateral 07/14/2017   Procedure: BILATERAL IMPLANTED PULSE GENERATOR CHANGE FOR DEEP BRAIN STIMULATOR;  Surgeon: Erline Levine, MD;  Location: [place];  Service: Neurosurgery;  Laterality: Bilateral;  . SUBTHALAMIC STIMULATOR INSERTION Bilateral 12/06/2013   Procedure: SUBTHALAMIC STIMULATOR INSERTION;  Surgeon: Erline Levine, MD;  Location: [place] NEURO ORS;  Service: Neurosurgery;  Laterality: Bilateral;  Bilateral deep brain stimulator placement    There were no vitals filed for this visit.  Subjective Assessment - 01/11/18 1259    Subjective  Patient returning to physical therapy after several week absense from being in respite care while spouse was on a prolonged trip. Since last Thursday: had one major fall and multiple minor falls. [place] [address] backwards with major fall and hit dresser then slid down it. Having back pain and diffuse bruising. Patient now diagnosed with numbness and tingling/neuropathy in hands and feet.     Patient is accompained by:  Family member    Pertinent History  Patient referred for physical therapy due to wife's request for patient to get aquatic therapy to help with his conditioning. Pt is also complaining  of chronic low back and R hip pain and frequent falls. 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 The last time he was in the water he was very uncomfortable because he didn't feel like he had any control. Pt has a history of 2 low back surgeries in August 2014 and February 2016. Pt and wife are unable to recall exactly what was performed but they know that he had stenosis. Surgeries were performed by Dr. Annette Stable in [place]. Pt reports that his low  back and R hip pain are constant. He describes the pain in the back as "dull" and the pain in the hip as "sharp." For both back and hip worst pain is 10/10, Best: 2/10, Present: 2/10. No particular episode of trauma to his low back or hip preceding the pain. Pt has a history of Parkinson's with a deep brain stimulator. He ambulates with a 4 wheeled walker called a U-step (automatic brakes, audio cues for stepping). He states that he falls frequently and reports that he has had approximately 40-50 falls in the last 6 months. No specific recent changes in his health.     Currently in Pain?  Yes    Pain Score  3     Pain Location  Back    Pain Orientation  Lower    Pain Descriptors / Indicators  Aching;Constant    Pain Type  Acute pain    Pain Onset  1 to 4 weeks ago    Pain Frequency  Constant    Aggravating Factors   pressure, weightbearing    Pain Relieving Factors  nothing      MODI: 60% BERG: 20/56 Falls per week : about 2 a day, about 14 a week.  strength Strength: 3+/5 bilaterally    [facility] PT Assessment - 01/11/18 0001      Berg Balance Test   Sit to Stand  Able to stand using hands after several tries    Standing Unsupported  Able to stand 30 seconds unsupported    Sitting with Back Unsupported but Feet Supported on Floor or Stool  Able to sit 2 minutes under supervision    Stand to Sit  Sits independently, has uncontrolled descent    Transfers  Able to transfer with verbal cueing and /or supervision    Standing Unsupported with Eyes Closed  Able to stand 3 seconds    Standing Ubsupported with Feet Together  Needs help to attain position and unable to hold for 15 seconds    From Standing, Reach Forward with Outstretched Arm  Reaches forward but needs supervision    From Standing Position, Pick up Object from Floor  Unable to pick up shoe, but reaches 2-5 cm (1-2") from shoe and balances independently    From Standing Position, Turn to Look Behind Over each Shoulder  Looks behind  one side only/other side shows less weight shift    Turn 360 Degrees  Needs close supervision or verbal cueing    Standing Unsupported, Alternately Place Feet on Step/Stool  Able to complete >2 steps/needs minimal assist    Standing Unsupported, One Foot in Front  Loses balance while stepping or standing    Standing on One Leg  Unable to try or needs assist to prevent fall    Total Score  20     Since last Thursday: had one major fall and multiple minor falls. [address] backwards with major fall and hit dresser  then slid down it. Having back pain and diffuse bruising. Patient now diagnosed with numbness and tingling/neuropathy in hands and feet.   Current back pain 3/10   Treat: 3lb ankle weights  Seated marches 15x each leg to PT hand  Seated LAQ 15x each leg to PT hand RTB hamstring curls 10x each leg  Weighted ball (2000g) seated without back support ball toss to encourage postural musculature and upright seated strengthening.   Balloon taps seated reaching inside and outside BOS while returning to COM, requires occasional cueing /min A to return to COM.       Patient's condition has the potential to improve in response to therapy. Maximum improvement is yet to be obtained. The anticipated improvement is attainable and reasonable in a generally predictable time.  Patient reports his back has been hurting after his fall. Reports he wasn't able to do his HEP while away.                PT Education - 01/11/18 1302    Education provided  Yes    Education Details  POC, goals, need to follow doctors orders and stay in [facility] , need to be compliant with seated HEP.     Person(s) Educated  Patient;Spouse    Methods  Explanation;Demonstration;Verbal cues    Comprehension  Verbalized understanding;Returned demonstration       PT Short Term Goals - 01/11/18 1334      PT SHORT TERM GOAL #1   Title  Pt will perform HEP with family's supervision, for improved balance, transfers, and  gait.      Baseline  patient reports compliance, however not every day; was unable to preform at the away home    Time  4    Period  Weeks    Status  Partially Met      PT SHORT TERM GOAL #2   Title  Pt will perform 5x sit<>stand in less than or equal to 15 seconds for improved efficiency and safety with transfers.    Baseline  19 seconds ; 15 seconds    Time  2    Period  Weeks    Status  Achieved        PT Long Term Goals - 01/11/18 1334      PT LONG TERM GOAL #1   Title  Pt will decrease 5TSTS by at least 3 seconds in order to demonstrate clinically significant improvement in LE strength     Baseline  02/08/17: 20.1 seconds 12/18: 17 seconds ; 09/14/17 =24.34 sec with assist due to poor static standing balance    Time  8    Period  Weeks    Status  Achieved      PT LONG TERM GOAL #2   Title  Pt will decrease TUG by at least 3.4s in order to demonstrate decreased fall risk     Baseline  02/08/17: 22.1s 12/18: 20 seconds 2/7: 19 seconds; 4/3: 23 seconds with walker, 09/14/17 15.12 sec 7/23: 15 seconds RW 8/22: no longer ambulatory    Time  8    Period  Weeks    Status  Deferred      PT LONG TERM GOAL #3   Title  Pt will improve BERG by at least 3 points in order to demonstrate clinically significant improvement in balance.    Baseline  02/08/17: 30/56 12/18: 38/56      Time  8    Period  Weeks    Status  Achieved      PT LONG TERM GOAL #4   Title  Pt/wife will verbalize understanding of plans for continued community fitness upon D/C from PT including aquatic therapy program    Baseline  02/08/17: pt currently reports being more comfortable in the water    Time  8    Period  Weeks    Status  Partially Met    Target Date  03/08/18      PT LONG TERM GOAL #5   Title  Pt will decrease mODI scoreby at least 13 points in order demonstrate clinically significant reduction in pain/disability     Baseline  02/08/17: 54% 12/18: 46% 2/7: 42% 4/3; 50%, 09/14/17 42% 7/23: 42% 8/22  8/22:  31/56 9/26: 60%    Time  8    Period  Weeks    Status  Partially Met    Target Date  03/08/18      PT LONG TERM GOAL #6   Title  Pt will improve BERG to 44/56  in order to demonstrate clinically significant improvement in balance for decreased fall risk    Baseline  12/18: 38/56 2/7: 41/56 4/3: 41/56, 09/14/17  19/56 7/23: 44/56 9/26: 20/56    Time  8    Period  Weeks    Status  On-going    Target Date  03/08/18      PT LONG TERM GOAL #7   Title  Patient will reduce number of falls per week to <3 to reduce chance of injury and decrease frequent LOB.     Baseline  8/22: 6x/week 9/26: 2x/ day     Time  8    Period  Weeks    Status  On-going    Target Date  03/08/18      PT LONG TERM GOAL #8   Title  Patient will increase functional strength to 4+/5 to improve functional mobility and safety with transfers.     Baseline  8/22: 4-/5 gross 9/26: 3+/5    Time  8    Period  Weeks    Status  On-going    Target Date  03/08/18            Plan - 01/11/18 1332    Clinical Impression Statement  Patient has been absent from physical therapy due to wife being on month long trip. Patient has been at long term care facility while wife was away. Patient has had decline in months absence in strength and stability. This rapid decline indicates that patient does benefit from skilled physical therapy and when not receiving therapy patient has decline in function. Patient has frequent episodes of LOB ~2x/day. Patient's condition has the potential to improve in response to therapy. Maximum improvement is yet to be obtained. The anticipated improvement is attainable and reasonable in a generally predictable time. Patient will benefit from continued skilled land and aquatic based physical therapy to improve strength, posture, and decrease fall risk.     Rehab Potential  Fair    PT Frequency  2x / week    PT Duration  8 weeks    PT Treatment/Interventions  ADLs/Self Care Home Management;Aquatic  Therapy;Cryotherapy;Electrical Stimulation;Iontophoresis '4mg'$ /ml Dexamethasone;Moist Heat;Ultrasound;Contrast Bath;DME Instruction;Gait training;Stair training;Functional mobility training;Therapeutic activities;Therapeutic exercise;Balance training;Neuromuscular re-education;Patient/family education;Manual techniques;Wheelchair mobility training;Energy conservation;Passive range of motion    PT Next Visit Plan  progress balance, strength, aquatic therapy    PT Home Exercise Plan  seated strengthening    Consulted and Agree with Plan of Care  Patient    Family Member Consulted  wife       Patient will benefit from skilled therapeutic intervention in order to improve the following deficits and impairments:  Abnormal gait, Decreased balance, Decreased coordination, Decreased mobility, Impaired tone, Postural dysfunction, Decreased strength, Decreased safety awareness, Improper body mechanics, Impaired flexibility  Visit Diagnosis: Muscle weakness (generalized)  Other lack of coordination  Repeated falls     Problem List Patient Active Problem List   Diagnosis Date Noted  . Skin sore 12/01/2017  . Hematoma 10/18/2017  . Accidental fall 10/18/2017  . 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 Radicular pain in right arm 10/09/2016  . GERD (gastroesophageal reflux disease) 07/31/2016  . Trochanteric bursitis 03/03/2016  . Left hand pain 10/30/2015  . Fracture, finger, multiple sites 10/30/2015  . Colon cancer screening 07/23/2015  . Healthcare maintenance 07/23/2015  . Gout 02/18/2015  . Depression 01/06/2015  . Irritation of eyelid 08/20/2014  . Dupuytren's contracture 08/20/2014  . Cough 07/21/2014  . Lumbar stenosis with neurogenic claudication 06/13/2014  . Preop cardiovascular exam 05/28/2014  . S/P deep brain stimulator placement 05/08/2014  . Joint pain 01/22/2014  . Medicare annual wellness visit, initial 01/19/2014  . Advance care planning 01/19/2014  . Parkinson's disease  ([place]) 12/13/2013  . PTSD (post-traumatic stress disorder) 06/13/2013  . Erectile dysfunction 06/13/2013  . HLD (hyperlipidemia) 06/13/2013  . Hip pain 06/10/2013  . Pain in joint, shoulder region 06/10/2013  . Right leg swelling 06/03/2013  . Essential hypertension 06/03/2013  . Bradycardia by electrocardiogram 06/03/2013  . Obstructive sleep apnea 03/12/2013  . Spinal stenosis, lumbar region, with neurogenic claudication 12/14/2012  Janna Arch, PT, DPT   01/11/2018, 1:37 PM  [place] MAIN [facility] SERVICES [address] [place], [place], [address] Phone: [phone]   Fax:  [phone]  Name: HOLGER SOKOLOWSKI MRN: [id number] Date of Birth: [birth year]

## 2018-01-11 NOTE — Therapy (Signed)
[place] MAIN [facility] SERVICES [address] [place], [place], [address] Phone: [phone]   Fax:  [phone]  Occupational Therapy Treatment  Patient Details  Name: [name] MRN: [id number] Date of Birth: 03/12/1951 Referring Provider: Wells Guiles Tat   Encounter Date: 01/11/2018  OT End of Session - 01/11/18 1050    Visit Number  23    Number of Visits  24    Date for OT Re-Evaluation  01/16/18    Authorization Type  Visit 2 of 10 for progress reporting period starting 12/14/2017    OT Start Time  0930    OT Stop Time  1015    OT Time Calculation (min)  45 min    Activity Tolerance  Patient tolerated treatment well    Behavior During Therapy  [facility] for tasks assessed/performed       Past Medical History:  Diagnosis Date  . Arthritis   . Bradycardia   . Cancer [facility]) 2013   skin cancer  . Depression    ptsd  . Dysrhythmia    chronic slow heart rate  . GERD (gastroesophageal reflux disease)   . Headache(784.0)    tension headaches non recent  . History of chicken pox   . History of kidney stones    passed  . Hypertension    treated with HCTZ  . Parkinson's disease ([place])    dx'ed 15 years ago  . PTSD (post-traumatic stress disorder)   . Shortness of breath dyspnea   . Sleep apnea    doesn't use C-pap  . Varicose veins     Past Surgical History:  Procedure Laterality Date  . CHOLECYSTECTOMY N/A 10/22/2014   Procedure: LAPAROSCOPIC CHOLECYSTECTOMY WITH INTRAOPERATIVE CHOLANGIOGRAM;  Surgeon: Dia Crawford III, MD;  Location: [facility] ORS;  Service: General;  Laterality: N/A;  . cyst removed      from lip as a child  . LUMBAR LAMINECTOMY/DECOMPRESSION MICRODISCECTOMY Bilateral 12/14/2012   Procedure: Bilateral lumbar three-four, four-five decompressive laminotomy/foraminotomy;  Surgeon: Charlie Pitter, MD;  Location: [place] NEURO ORS;  Service: Neurosurgery;  Laterality: Bilateral;  . PULSE GENERATOR IMPLANT Bilateral 12/13/2013    Procedure: Bilateral implantable pulse generator placement;  Surgeon: Erline Levine, MD;  Location: [place] NEURO ORS;  Service: Neurosurgery;  Laterality: Bilateral;  Bilateral implantable pulse generator placement  . skin cancer removed     from ears,   12 lft arm  rt leg 15  . SUBTHALAMIC STIMULATOR BATTERY REPLACEMENT Bilateral 07/14/2017   Procedure: BILATERAL IMPLANTED PULSE GENERATOR CHANGE FOR DEEP BRAIN STIMULATOR;  Surgeon: Erline Levine, MD;  Location: [place];  Service: Neurosurgery;  Laterality: Bilateral;  . SUBTHALAMIC STIMULATOR INSERTION Bilateral 12/06/2013   Procedure: SUBTHALAMIC STIMULATOR INSERTION;  Surgeon: Erline Levine, MD;  Location: [place] NEURO ORS;  Service: Neurosurgery;  Laterality: Bilateral;  Bilateral deep brain stimulator placement    There were no vitals filed for this visit.  Subjective Assessment - 01/11/18 1049    Subjective   Pt. reports that he was miserable at [facility]pite while his wife was away.    Patient is accompained by:  Family member    Pertinent History  Pt. is a 67 y.o. mlae who has a history of Parkinson's Disease. Pt. has a Deep Brain Stimulator in place. Pt. has a history of Low Back Pain with 2 back surgeries in August 2014, and February 2016. Pt. has a history of multiple frequent falls occurring at a rate of 6x's a week.  Limitations  Frequent falls, limited motor control, and [place].     Patient Stated Goals  To return home.    Currently in Pain?  No/denies      OT TREATMENT    Selfcare:  Pt. worked on typing tasks. Pt. required extensive cues to copy complex typed text in short words, long words, and sentences. Pt presented with multiple mistypes, and increased difficulty completing. Pt. was able to type more accurately when typing his name, and auditory sentences without cues. Pt. required minimal cuing, and increased time when typing a sentence that he created on his own. Pt. Requires continued work on  typing.                              OT Long Term Goals - 12/07/17 0946      OT LONG TERM GOAL #1   Title  Pt. will improve Bilateral UE strength by 1 mm grade to assist with ADLs, and IADLs    Baseline  BUE strength: 4+/5 overall    Time  12    Period  Weeks    Target Date  01/16/18      OT LONG TERM GOAL #2   Title  Pt. will improve grip strength to be able to pour himself a beverage.    Baseline  Pt. continues to be unable to hold and pour a beevrage.    Time  12    Period  Weeks    Status  On-going    Target Date  01/16/18      OT LONG TERM GOAL #3   Title  Pt. will improve lateral pinch stength to be able to independently turn a key.     Baseline  Pt. continues to have difficulty holding, and turning  key.    Time  12    Period  Weeks    Status  On-going    Target Date  01/16/18      OT LONG TERM GOAL #4   Title  Pt. will improve Right hand [facility] skills to be able to independently grasp and use small objects needed for fixing car parts.    Baseline   45sec. on the 9 hole peg test.    Time  12    Period  Weeks    Status  On-going      OT LONG TERM GOAL #5   Title  Pt. will be demonstrate self-feeding with modified independence with minimal spillage.    Baseline  Pt. continues to require assistance, and has alot of spillage.    Period  Weeks    Status  On-going    Target Date  01/16/18      OT LONG TERM GOAL #7   Title  Pt. will write 1 sentence efficiently with 90% legibility.    Baseline  name only 75% legibility    Time  12    Period  Weeks    Status  On-going    Target Date  01/16/18      OT LONG TERM GOAL #8   Title  Pt. will improve typing speed, and accuracy to be able to to type emails efficently    Baseline  Typing speed: 6 wpm, Accuracy: 33% with moderate cues for hand placement    Time  12    Period  Weeks    Status  On-going    Target Date  01/16/18  Plan - 01/11/18 1051    Clinical Impression  Statement Pt. has been staying in respite care at [facility] while his wife was in [place], and [place].  Pt. reports having had 2 falls, one of which resulted in back pain. Pt. worked on Pt. typing speed  was 1 wpm with 0% accuracy on a typing test. Pt. required extensive cues typing letter accuracy when copying text. Pt. had difficulty with both simple and complex words. Pt. requires continued work on improving strength, motor control, and [facility] skills in order to improve writing, and typing taks.     Occupational performance deficits (Please refer to evaluation for details):  ADL's;IADL's    Rehab Potential  Good    Current Impairments/barriers affecting progress:  Positive Indicators: age, familiy support, motivation. Negative Indicators: Multiple comorbidities, and history of falls.    OT Frequency  2x / week    OT Duration  12 weeks    OT Treatment/Interventions  Self-care/ADL training;Therapeutic exercise;DME and/or AE instruction;Energy conservation;Neuromuscular education;Patient/family education;Therapeutic activities    Clinical Decision Making  Several treatment options, min-mod task modification necessary    OT Home Exercise Plan  HEP for using rubber bands for strengthening at home and work on more handwriting.    Consulted and Agree with Plan of Care  Patient       Patient will benefit from skilled therapeutic intervention in order to improve the following deficits and impairments:  Pain, Decreased strength, Decreased coordination, Decreased balance, Impaired UE functional use, Decreased safety awareness, Decreased activity tolerance, Decreased cognition, Impaired flexibility, Impaired vision/preception  Visit Diagnosis: Muscle weakness (generalized)  Other lack of coordination    Problem List Patient Active Problem List   Diagnosis Date Noted  . Skin sore 12/01/2017  . Hematoma 10/18/2017  . Accidental fall 10/18/2017  . 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  Radicular pain in right arm 10/09/2016  . GERD (gastroesophageal reflux disease) 07/31/2016  . Trochanteric bursitis 03/03/2016  . Left hand pain 10/30/2015  . Fracture, finger, multiple sites 10/30/2015  . Colon cancer screening 07/23/2015  . Healthcare maintenance 07/23/2015  . Gout 02/18/2015  . Depression 01/06/2015  . Irritation of eyelid 08/20/2014  . Dupuytren's contracture 08/20/2014  . Cough 07/21/2014  . Lumbar stenosis with neurogenic claudication 06/13/2014  . Preop cardiovascular exam 05/28/2014  . S/P deep brain stimulator placement 05/08/2014  . Joint pain 01/22/2014  . Medicare annual wellness visit, initial 01/19/2014  . Advance care planning 01/19/2014  . Parkinson's disease ([place]) 12/13/2013  . PTSD (post-traumatic stress disorder) 06/13/2013  . Erectile dysfunction 06/13/2013  . HLD (hyperlipidemia) 06/13/2013  . Hip pain 06/10/2013  . Pain in joint, shoulder region 06/10/2013  . Right leg swelling 06/03/2013  . Essential hypertension 06/03/2013  . Bradycardia by electrocardiogram 06/03/2013  . Obstructive sleep apnea 03/12/2013  . Spinal stenosis, lumbar region, with neurogenic claudication 12/14/2012    Harrel Carina, MS, OTR/L 01/11/2018, 11:26 AM  [place] MAIN [facility] SERVICES [address] [place], [place], [address] Phone: [phone]   Fax:  [phone]  Name: MIR FULLILOVE MRN: [id number] Date of Birth: 1950-06-07

## 2018-01-16 ENCOUNTER — Ambulatory Visit: Payer: Medicare PPO

## 2018-01-18 ENCOUNTER — Other Ambulatory Visit: Payer: Self-pay

## 2018-01-18 ENCOUNTER — Ambulatory Visit: Payer: Medicare PPO | Attending: Neurology

## 2018-01-18 DIAGNOSIS — R2681 Unsteadiness on feet: Secondary | ICD-10-CM | POA: Diagnosis not present

## 2018-01-18 DIAGNOSIS — M48062 Spinal stenosis, lumbar region with neurogenic claudication: Secondary | ICD-10-CM | POA: Diagnosis present

## 2018-01-18 DIAGNOSIS — R279 Unspecified lack of coordination: Secondary | ICD-10-CM

## 2018-01-18 DIAGNOSIS — R293 Abnormal posture: Secondary | ICD-10-CM | POA: Diagnosis not present

## 2018-01-18 DIAGNOSIS — R262 Difficulty in walking, not elsewhere classified: Secondary | ICD-10-CM | POA: Diagnosis not present

## 2018-01-18 DIAGNOSIS — G20 Parkinson's disease: Secondary | ICD-10-CM | POA: Diagnosis not present

## 2018-01-18 DIAGNOSIS — R2689 Other abnormalities of gait and mobility: Secondary | ICD-10-CM | POA: Diagnosis not present

## 2018-01-18 DIAGNOSIS — G8929 Other chronic pain: Secondary | ICD-10-CM | POA: Insufficient documentation

## 2018-01-18 DIAGNOSIS — R296 Repeated falls: Secondary | ICD-10-CM | POA: Diagnosis not present

## 2018-01-18 DIAGNOSIS — R278 Other lack of coordination: Secondary | ICD-10-CM | POA: Diagnosis not present

## 2018-01-18 DIAGNOSIS — R29898 Other symptoms and signs involving the musculoskeletal system: Secondary | ICD-10-CM

## 2018-01-18 DIAGNOSIS — M6281 Muscle weakness (generalized): Secondary | ICD-10-CM | POA: Diagnosis not present

## 2018-01-18 DIAGNOSIS — M545 Low back pain, unspecified: Secondary | ICD-10-CM

## 2018-01-18 DIAGNOSIS — R29818 Other symptoms and signs involving the nervous system: Secondary | ICD-10-CM | POA: Diagnosis present

## 2018-01-18 NOTE — Therapy (Signed)
[place] MAIN [facility] SERVICES [address] [place], [place], [address] Phone: [phone]   Fax:  [phone]  Physical Therapy Treatment  Patient Details  Name: [name] MRN: [id number] Date of Birth: October 30, 1950 Referring Provider (PT): Alonza Bogus   Encounter Date: 01/18/2018  PT End of Session - 01/18/18 1207    Visit Number  34    Number of Visits  73    Date for PT Re-Evaluation  03/08/18    Authorization Type  2/10 starts 9/26    PT Start Time  0930    PT Stop Time  1015    PT Time Calculation (min)  45 min    Activity Tolerance  Patient tolerated treatment well;Other (comment)    Behavior During Therapy  WFL for tasks assessed/performed       Past Medical History:  Diagnosis Date  . Arthritis   . Bradycardia   . Cancer [facility]) 2013   skin cancer  . Depression    ptsd  . Dysrhythmia    chronic slow heart rate  . GERD (gastroesophageal reflux disease)   . Headache(784.0)    tension headaches non recent  . History of chicken pox   . History of kidney stones    passed  . Hypertension    treated with HCTZ  . Parkinson's disease ([place])    dx'ed 15 years ago  . PTSD (post-traumatic stress disorder)   . Shortness of breath dyspnea   . Sleep apnea    doesn't use C-pap  . Varicose veins     Past Surgical History:  Procedure Laterality Date  . CHOLECYSTECTOMY N/A 10/22/2014   Procedure: LAPAROSCOPIC CHOLECYSTECTOMY WITH INTRAOPERATIVE CHOLANGIOGRAM;  Surgeon: Dia Crawford III, MD;  Location: [facility] ORS;  Service: General;  Laterality: N/A;  . cyst removed      from lip as a child  . LUMBAR LAMINECTOMY/DECOMPRESSION MICRODISCECTOMY Bilateral 12/14/2012   Procedure: Bilateral lumbar three-four, four-five decompressive laminotomy/foraminotomy;  Surgeon: Charlie Pitter, MD;  Location: [place] [facility]URO ORS;  Service: Neurosurgery;  Laterality: Bilateral;  . PULSE GENERATOR IMPLANT Bilateral 12/13/2013   Procedure: Bilateral implantable  pulse generator placement;  Surgeon: Erline Levine, MD;  Location: [place] [facility] ORS;  Service: Neurosurgery;  Laterality: Bilateral;  Bilateral implantable pulse generator placement  . skin cancer removed     from ears,   12 lft arm  rt leg 15  . SUBTHALAMIC STIMULATOR BATTERY REPLACEMENT Bilateral 07/14/2017   Procedure: BILATERAL IMPLANTED PULSE GENERATOR CHANGE FOR DEEP BRAIN STIMULATOR;  Surgeon: Erline Levine, MD;  Location: [place];  Service: Neurosurgery;  Laterality: Bilateral;  . SUBTHALAMIC STIMULATOR INSERTION Bilateral 12/06/2013   Procedure: SUBTHALAMIC STIMULATOR INSERTION;  Surgeon: Erline Levine, MD;  Location: [place] [facility]UR[facility] ORS;  Service: Neurosurgery;  Laterality: Bilateral;  Bilateral deep brain stimulator placement    There were no vitals filed for this visit.  Subjective Assessment - 01/18/18 1204    Subjective  Pt/spouse report a fall early this morning due to pt getting up out of bed without fully awakening feeling someone was at the door (due to a dream). No injuries reported. Pt denies pain. Pt/spouse report aide is back working for pt part time    Patient is accompained by:  Family member    Pertinent History  Patient referred for physical therapy due to wife's request for patient to get aquatic therapy to help with his conditioning. Pt is also complaining of chronic low back and R hip pain and frequent  falls. 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 The last time he was in the water he was very uncomfortable because he didn't feel like he had any control. Pt has a history of 2 low back surgeries in August 2014 and February 2016. Pt and wife are unable to recall exactly what was performed but they know that he had stenosis. Surgeries were performed by Dr. Annette Stable in [place]. Pt reports that his low back and R hip pain are constant. He describes the pain in the back as "dull" and the pain in the hip as "sharp." For both back and hip  worst pain is 10/10, Best: 2/10, Present: 2/10. No particular episode of trauma to his low back or hip preceding the pain. Pt has a history of Parkinson's with a deep brain stimulator. He ambulates with a 4 wheeled walker called a U-step (automatic brakes, audio cues for stepping). He states that he falls frequently and reports that he has had approximately 40-50 falls in the last 6 months. No specific recent changes in his health.       3# B ankle wts throughout entire session.   Min A enter/exit via ramp and use of 2 hands on rail with side stepping'  Ambulation, Blue dumbbells/assist  Fwd, 2 L  Side, 2 L   Bkwd, 2 L   Resisted squat with super noodle, 30x  High knee march with blue dumbbells, 2L   Ball toss/catch  Static, pt in front of bench  Dynamic with side step, pt in front of bench  Dynamic with fwd/bkwd stepping, B  Self toss up with walking  Static alternating abdominal rotation with grab and give  R to L  L to R   STS on L  10x                                   PT Short Term Goals - 01/11/18 1334      PT SHORT TERM GOAL #1   Title  Pt will perform HEP with family's supervision, for improved balance, transfers, and gait.      Baseline  patient reports compliance, however not every day; was unable to preform at the away home    Time  4    Period  Weeks    Status  Partially Met      PT SHORT TERM GOAL #2   Title  Pt will perform 5x sit<>stand in less than or equal to 15 seconds for improved efficiency and safety with transfers.    Baseline  19 seconds ; 15 seconds    Time  2    Period  Weeks    Status  Achieved        PT Long Term Goals - 01/11/18 1334      PT LONG TERM GOAL #1   Title  Pt will decrease 5TSTS by at least 3 seconds in order to demonstrate clinically significant improvement in LE strength     Baseline  02/08/17: 20.1 seconds 12/18: 17 seconds ; 09/14/17 =24.34 sec with assist due to poor static standing balance     Time  8    Period  Weeks    Status  Achieved      PT LONG TERM GOAL #2   Title  Pt will decrease TUG by at least 3.4s in order to demonstrate  decreased fall risk     Baseline  02/08/17: 22.1s 12/18: 20 seconds 2/7: 19 seconds; 4/3: 23 seconds with walker, 09/14/17 15.12 sec 7/23: 15 seconds RW 8/22: no longer ambulatory    Time  8    Period  Weeks    Status  Deferred      PT LONG TERM GOAL #3   Title  Pt will improve BERG by at least 3 points in order to demonstrate clinically significant improvement in balance.    Baseline  02/08/17: 30/56 12/18: 38/56      Time  8    Period  Weeks    Status  Achieved      PT LONG TERM GOAL #4   Title  Pt/wife will verbalize understanding of plans for continued community fitness upon D/C from PT including aquatic therapy program    Baseline  02/08/17: pt currently reports being more comfortable in the water    Time  8    Period  Weeks    Status  Partially Met    Target Date  03/08/18      PT LONG TERM GOAL #5   Title  Pt will decrease mODI scoreby at least 13 points in order demonstrate clinically significant reduction in pain/disability     Baseline  02/08/17: 54% 12/18: 46% 2/7: 42% 4/3; 50%, 09/14/17 42% 7/23: 42% 8/22 8/22:  31/56 9/26: 60%    Time  8    Period  Weeks    Status  Partially Met    Target Date  03/08/18      PT LONG TERM GOAL #6   Title  Pt will improve BERG to 44/56  in order to demonstrate clinically significant improvement in balance for decreased fall risk    Baseline  12/18: 38/56 2/7: 41/56 4/3: 41/56, 09/14/17  19/56 7/23: 44/56 9/26: 20/56    Time  8    Period  Weeks    Status  On-going    Target Date  03/08/18      PT LONG TERM GOAL #7   Title  Patient will reduce number of falls per week to <3 to reduce chance of injury and decrease frequent LOB.     Baseline  8/22: 6x/week 9/26: 2x/ day     Time  8    Period  Weeks    Status  On-going    Target Date  03/08/18      PT LONG TERM GOAL #8   Title  Patient  will increase functional strength to 4+/5 to improve functional mobility and safety with transfers.     Baseline  8/22: 4-/5 gross 9/26: 3+/5    Time  8    Period  Weeks    Status  On-going    Target Date  03/08/18            Plan - 01/18/18 1208    Clinical Impression Statement  B 3# ankle wts throughout session for grounding puposes. Pt requires assist intermittently with ambulation activities for balance. Does tolerate higher level balance activities with ball toss statically and dynamically. Pt demonstrating better STS using primarily L today. Continue to work on strength and balance to improve stability with transfers/stand activities on land.     Rehab Potential  Fair    PT Frequency  2x / week    PT Duration  8 weeks    PT Treatment/Interventions  ADLs/Self Care Home Management;Aquatic Therapy;Cryotherapy;Electrical Stimulation;Iontophoresis 61m/ml Dexamethasone;Moist Heat;Ultrasound;Contrast Bath;DME Instruction;Gait training;Stair training;Functional mobility training;Therapeutic  activities;Therapeutic exercise;Balance training;Neuromuscular re-education;Patient/family education;Manual techniques;Wheelchair mobility training;Energy conservation;Passive range of motion    PT Next Visit Plan  progress balance, strength, aquatic therapy    PT Home Exercise Plan  seated strengthening    Consulted and Agree with Plan of Care  Patient    Family Member Consulted  wife       Patient will benefit from skilled therapeutic intervention in order to improve the following deficits and impairments:  Abnormal gait, Decreased balance, Decreased coordination, Decreased mobility, Impaired tone, Postural dysfunction, Decreased strength, Decreased safety awareness, Improper body mechanics, Impaired flexibility  Visit Diagnosis: Muscle weakness (generalized)  Other lack of coordination  Repeated falls  Other abnormalities of gait and mobility  Unsteadiness on feet  Unspecified lack of  coordination  Abnormal posture  Difficulty walking  Parkinson's disease (HCC)  Other symptoms and signs involving the musculoskeletal system  Other symptoms and signs involving the nervous system  Chronic midline low back pain without sciatica     Problem List Patient Active Problem List   Diagnosis Date Noted  . Skin sore 12/01/2017  . Hematoma 10/18/2017  . Accidental fall 10/18/2017  . 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 Radicular pain in right arm 10/09/2016  . GERD (gastroesophageal reflux disease) 07/31/2016  . Trochanteric bursitis 03/03/2016  . Left hand pain 10/30/2015  . Fracture, finger, multiple sites 10/30/2015  . Colon cancer screening 07/23/2015  . Healthcare maintenance 07/23/2015  . Gout 02/18/2015  . Depression 01/06/2015  . Irritation of eyelid 08/20/2014  . Dupuytren's contracture 08/20/2014  . Cough 07/21/2014  . Lumbar stenosis with neurogenic claudication 06/13/2014  . Preop cardiovascular exam 05/28/2014  . S/P deep brain stimulator placement 05/08/2014  . Joint pain 01/22/2014  . Medicare annual wellness visit, initial 01/19/2014  . Advance care planning 01/19/2014  . Parkinson's disease ([place]) 12/13/2013  . PTSD (post-traumatic stress disorder) 06/13/2013  . Erectile dysfunction 06/13/2013  . HLD (hyperlipidemia) 06/13/2013  . Hip pain 06/10/2013  . Pain in joint, shoulder region 06/10/2013  . Right leg swelling 06/03/2013  . Essential hypertension 06/03/2013  . Bradycardia by electrocardiogram 06/03/2013  . Obstructive sleep apnea 03/12/2013  . Spinal stenosis, lumbar region, with neurogenic claudication 12/14/2012    Larae Grooms 01/18/2018, 12:14 PM  [place] MAIN [facility] SERVICES [place], [place], [address] Phone: [phone]   Fax:  [phone]  Name: [name] MRN: [id number] Date of Birth: 1950-07-22

## 2018-01-19 ENCOUNTER — Ambulatory Visit: Payer: Medicare PPO | Admitting: Occupational Therapy

## 2018-01-19 ENCOUNTER — Encounter: Payer: Self-pay | Admitting: Occupational Therapy

## 2018-01-19 DIAGNOSIS — R278 Other lack of coordination: Secondary | ICD-10-CM

## 2018-01-19 DIAGNOSIS — R293 Abnormal posture: Secondary | ICD-10-CM | POA: Diagnosis not present

## 2018-01-19 DIAGNOSIS — G20 Parkinson's disease: Secondary | ICD-10-CM | POA: Diagnosis not present

## 2018-01-19 DIAGNOSIS — R262 Difficulty in walking, not elsewhere classified: Secondary | ICD-10-CM | POA: Diagnosis not present

## 2018-01-19 DIAGNOSIS — R279 Unspecified lack of coordination: Secondary | ICD-10-CM | POA: Diagnosis not present

## 2018-01-19 DIAGNOSIS — R2689 Other abnormalities of gait and mobility: Secondary | ICD-10-CM | POA: Diagnosis not present

## 2018-01-19 DIAGNOSIS — R296 Repeated falls: Secondary | ICD-10-CM | POA: Diagnosis not present

## 2018-01-19 DIAGNOSIS — M6281 Muscle weakness (generalized): Secondary | ICD-10-CM | POA: Diagnosis not present

## 2018-01-19 DIAGNOSIS — R2681 Unsteadiness on feet: Secondary | ICD-10-CM | POA: Diagnosis not present

## 2018-01-19 NOTE — Therapy (Addendum)
[place] [address] [place] MAIN [facility] SERVICES [address] [place], [place], [address] Phone: [phone]   Fax:  [phone]  Occupational Therapy Treatment / Re-certification  Patient Details  Name: [name] MRN: [id number] Date of Birth: [birth year] No data recorded  Encounter Date: 01/19/2018  OT End of Session - 01/19/18 1158    Visit Number  24    Number of Visits  39    Date for OT Re-Evaluation  04/13/18    Authorization Type  Visit 3 of 10 for progress reporting period starting 12/14/2017    OT Start Time  1015    OT Stop Time  1100    OT Time Calculation (min)  45 min    Activity Tolerance  Patient tolerated treatment well    Behavior During Therapy  [facility] for tasks assessed/performed       Past Medical History:  Diagnosis Date  . Arthritis   . Bradycardia   . Cancer [facility]) 2013   skin cancer  . Depression    ptsd  . Dysrhythmia    chronic slow heart rate  . GERD (gastroesophageal reflux disease)   . Headache(784.0)    tension headaches non recent  . History of chicken pox   . History of kidney stones    passed  . Hypertension    treated with HCTZ  . Parkinson's disease ([place])    dx'ed 15 years ago  . PTSD (post-traumatic stress disorder)   . Shortness of breath dyspnea   . Sleep apnea    doesn't use C-pap  . Varicose veins     Past Surgical History:  Procedure Laterality Date  . CHOLECYSTECTOMY N/A 10/22/2014   Procedure: LAPAROSCOPIC CHOLECYSTECTOMY WITH INTRAOPERATIVE CHOLANGIOGRAM;  Surgeon: Dia Crawford III, MD;  Location: [facility] ORS;  Service: General;  Laterality: N/A;  . cyst removed      from lip as a child  . LUMBAR LAMINECTOMY/DECOMPRESSION MICRODISCECTOMY Bilateral 12/14/2012   Procedure: Bilateral lumbar three-four, four-five decompressive laminotomy/foraminotomy;  Surgeon: Charlie Pitter, MD;  Location: [place] NEURO ORS;  Service: Neurosurgery;  Laterality: Bilateral;  . PULSE GENERATOR IMPLANT Bilateral 12/13/2013    Procedure: Bilateral implantable pulse generator placement;  Surgeon: Erline Levine, MD;  Location: [place] NEURO ORS;  Service: Neurosurgery;  Laterality: Bilateral;  Bilateral implantable pulse generator placement  . skin cancer removed     from ears,   12 lft arm  rt leg 15  . SUBTHALAMIC STIMULATOR BATTERY REPLACEMENT Bilateral 07/14/2017   Procedure: BILATERAL IMPLANTED PULSE GENERATOR CHANGE FOR DEEP BRAIN STIMULATOR;  Surgeon: Erline Levine, MD;  Location: [place];  Service: Neurosurgery;  Laterality: Bilateral;  . SUBTHALAMIC STIMULATOR INSERTION Bilateral 12/06/2013   Procedure: SUBTHALAMIC STIMULATOR INSERTION;  Surgeon: Erline Levine, MD;  Location: [place] NEURO ORS;  Service: Neurosurgery;  Laterality: Bilateral;  Bilateral deep brain stimulator placement    There were no vitals filed for this visit.  Subjective Assessment - 01/19/18 1025    Subjective   Pt reports that he is doing well and that he has had a chance to practice typing at home.    Pertinent History  Pt. is a 67 y.o. mlae who has a history of Parkinson's Disease. Pt. has a Deep Brain Stimulator in place. Pt. has a history of Low Back Pain with 2 back surgeries in August 2014, and February 2016. Pt. has a history of multiple frequent falls occurring at a rate of 6x's a week.     Limitations  Frequent falls,  limited motor control, and [place].     Patient Stated Goals  To return home.    Currently in Pain?  No/denies    Pain Score  0-No pain      [facility] OT Assessment - 01/19/18 1117      Coordination   Right 9 Hole Peg Test  56 sec.    Left 9 Hole Peg Test  58 sec.      AROM   Overall AROM Comments  AROM shoulder flexion WNLs      Strength   Overall Strength Comments  BUE strength: 4+/5 overall      Hand Function   Right Hand Grip (lbs)  70    Right Hand Lateral Pinch  14 lbs    Right Hand 3 Point Pinch  14 lbs    Left Hand Grip (lbs)  62    Left Hand Lateral Pinch  17 lbs    Left 3 point pinch  15 lbs         OT  TREATMENT  Measurements were obtained with comparison for reassessment/recertification. Pt has made slight regression in each measurement area which may be due to inconsistency with therapy since he was in respite for a short time.   Discussed goals and progress with patient.  Patient indicates he really wants to be more independent and would like to work on his handwriting and typing skills as a form of communication.  Patient reports he was able to complete a few text messages this week with success.  Areas patient would like to improve:  Trouble with typing, writing, reading Getting in and out of the bath Walking up to chair and sitting down Assist with pants Socks increased difficulty  Shoes can put on, assist with tying Feeding self but has spillage Can do buttons except cuff   Self-care:  Pt completed self-care activity that required him to type to improve his typing speed and accuracy in order to be able to type emails and messages. Pt typed at a typing speed of 2 wpm with 7 errors during a 1 minute typing assessment and at 6 wpm with 135 errors during a 5 minute typing assessment. As pt's typing speed increased the number of errors also increased.   [facility] OT Assessment - 01/19/18 1117      Coordination   Right 9 Hole Peg Test  56 sec.    Left 9 Hole Peg Test  58 sec.      AROM   Overall AROM Comments  AROM shoulder flexion WNLs      Strength   Overall Strength Comments  BUE strength: 4+/5 overall      Hand Function   Right Hand Grip (lbs)  70    Right Hand Lateral Pinch  14 lbs    Right Hand 3 Point Pinch  14 lbs    Left Hand Grip (lbs)  62    Left Hand Lateral Pinch  17 lbs    Left 3 point pinch  15 lbs                       OT Education - 01/19/18 1027    Education provided  Yes    Education Details  POC, goals, typing    Person(s) Educated  Patient    Methods  Verbal cues;Explanation    Comprehension  Verbal cues required;Verbalized understanding           OT Long Term Goals - 01/19/18 0945  OT LONG TERM GOAL #1   Title  Pt. will improve Bilateral UE strength by 1 mm grade to assist with ADLs, and IADLs    Baseline  BUE strength: 4+/5 overall    Time  12    Period  Weeks    Status  Achieved      OT LONG TERM GOAL #2   Title  Pt. will improve grip strength to be able to pour himself a beverage.    Baseline  Pt. continues to be unable to hold and pour a bevrage.    Time  12    Period  Weeks    Status  Achieved    Target Date  --      OT LONG TERM GOAL #3   Title  Pt. will improve lateral pinch stength to be able to independently turn a key.     Baseline  Pt. continues to have difficulty holding, and turning  key.    Time  12    Period  Weeks    Status  Achieved      OT LONG TERM GOAL #4   Title  Pt. will improve Right hand [facility] skills to be able to independently grasp and use small objects needed for fixing car parts.    Baseline   45sec. on the 9 hole peg test. 01/19/2018 56 sec. on 9 hole peg test.    Time  12    Period  Weeks    Status  On-going    Target Date  04/13/18      OT LONG TERM GOAL #5   Title  Pt. will be demonstrate self-feeding with modified independence with minimal spillage.    Baseline  Pt. continues to require assistance, and has alot of spillage.    Period  Weeks    Status  On-going    Target Date  04/13/18      Long Term Additional Goals   Additional Long Term Goals  Yes      OT LONG TERM GOAL #6   Title  Pt. will manipulate buttons, and cuff buttons independently.    Baseline   Pt. has difficulty manipulating buttons    Time  12    Period  Weeks    Status  On-going    Target Date  04/13/18      OT LONG TERM GOAL #7   Title  Pt. will write 1 sentence efficiently with 90% legibility.    Baseline  name only 75% legibility    Time  12    Period  Weeks    Status  On-going    Target Date  04/13/18      OT LONG TERM GOAL #8   Title  Pt. will improve typing speed, and accuracy to  be able to to type emails efficently    Baseline  Typing speed: 6 wpm, Accuracy: 33% with moderate cues for hand placement    Time  12    Period  Weeks    Status  On-going    Target Date  04/13/18      OT LONG TERM GOAL  #9   Baseline  Pt will transfers in and out of the shower with stand-by assistance    Time  12    Period  Weeks    Status  New    Target Date  04/13/18      OT LONG TERM GOAL  #10   TITLE  Pt will don and doff pants  with min A, PRN    Baseline  Pt requires mod A to don and doff pants    Time  12    Period  Weeks    Status  New    Target Date  04/13/18      OT LONG TERM GOAL  #11   TITLE  Pt will don and doff socks modified independently using AE, PRN    Baseline  Pt has increased difficulty donning and doffing socks    Time  6    Period  Weeks    Status  New    Target Date  03/02/18      OT LONG TERM GOAL  #12   TITLE  Pt will independently tie shoes    Baseline  Pt requires assistance to tie shoes    Time  6    Period  Weeks    Status  New    Target Date  03/02/18             Plan - 01/19/18 1029    Clinical Impression Statement  Pt's goals and measures were reassessed and updated this date.. Pt has made slight regression in each measure which may be due to recent absence from therapy while patient was in respite care. Patient indicating areas he would like to see improvements and is willing to be committed to working towards in the next few weeks.  Patient continues to benefit from skilled OT services to maximize safety and independence in daily tasks.  Areas of focus this date for tx:[place] Kitchen Pt completed typing tasks to improve typing speed and accuracy. Pt demonstrated typing speed of 2 wpm with increased errors. Pt reports being able to send a couple text messages within the last week. Pt to continue to work to make progress with typing, writing, [place], and motor control.    Occupational performance deficits (Please refer to evaluation for details):   ADL's;IADL's    Rehab Potential  Good    Current Impairments/barriers affecting progress:  Positive Indicators: age, familiy support, motivation. Negative Indicators: Multiple comorbidities, and history of falls.    OT Frequency  2x / week    OT Duration  12 weeks    OT Treatment/Interventions  Self-care/ADL training;Therapeutic exercise;DME and/or AE instruction;Energy conservation;Neuromuscular education;Patient/family education;Therapeutic activities    Clinical Decision Making  Several treatment options, min-mod task modification necessary    OT Home Exercise Plan  HEP for using rubber bands for strengthening at home and work on more handwriting.    Consulted and Agree with Plan of Care  Patient       Patient will benefit from skilled therapeutic intervention in order to improve the following deficits and impairments:  Pain, Decreased strength, Decreased coordination, Decreased balance, Impaired UE functional use, Decreased safety awareness, Decreased activity tolerance, Decreased cognition, Impaired flexibility, Impaired vision/preception  Visit Diagnosis: Muscle weakness (generalized) - Plan: Ot plan of care cert/re-cert  Other lack of coordination - Plan: Ot plan of care cert/re-cert    Problem List Patient Active Problem List   Diagnosis Date Noted  . Skin sore 12/01/2017  . Hematoma 10/18/2017  . Accidental fall 10/18/2017  . 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 Radicular pain in right arm 10/09/2016  . GERD (gastroesophageal reflux disease) 07/31/2016  . Trochanteric bursitis 03/03/2016  . Left hand pain 10/30/2015  . Fracture, finger, multiple sites 10/30/2015  . Colon cancer screening 07/23/2015  . Healthcare maintenance 07/23/2015  . Gout 02/18/2015  . Depression 01/06/2015  .  Irritation of eyelid 08/20/2014  . Dupuytren's contracture 08/20/2014  . Cough 07/21/2014  . Lumbar stenosis with neurogenic claudication 06/13/2014  . Preop cardiovascular exam 05/28/2014   . S/P deep brain stimulator placement 05/08/2014  . Joint pain 01/22/2014  . Medicare annual wellness visit, initial 01/19/2014  . Advance care planning 01/19/2014  . Parkinson's disease ([place]) 12/13/2013  . PTSD (post-traumatic stress disorder) 06/13/2013  . Erectile dysfunction 06/13/2013  . HLD (hyperlipidemia) 06/13/2013  . Hip pain 06/10/2013  . Pain in joint, shoulder region 06/10/2013  . Right leg swelling 06/03/2013  . Essential hypertension 06/03/2013  . Bradycardia by electrocardiogram 06/03/2013  . Obstructive sleep apnea 03/12/2013  . Spinal stenosis, lumbar region, with neurogenic claudication 12/14/2012   [name] T Lovett, OTR/L, CLT This entire session was performed under direct supervision and direction of a licensed therapist/therapist assistant . I have personally read, edited and approve of the note as written.  Oliver Hum, OTS 01/20/2018, 11:43 AM  [place] MAIN [facility] SERVICES [address] [place], [place], [address] Phone: [phone]   Fax:  [phone]  Name: [name] MRN: [id number] Date of Birth: 1951-02-02

## 2018-01-21 NOTE — Addendum Note (Signed)
Addended by: Lucia Bitter on: 01/21/2018 09:14 PM   Modules accepted: Orders

## 2018-01-22 ENCOUNTER — Ambulatory Visit: Payer: Medicare PPO | Admitting: Occupational Therapy

## 2018-01-22 ENCOUNTER — Encounter: Payer: Self-pay | Admitting: Occupational Therapy

## 2018-01-22 ENCOUNTER — Ambulatory Visit (INDEPENDENT_AMBULATORY_CARE_PROVIDER_SITE_OTHER): Payer: Medicare PPO | Admitting: Psychology

## 2018-01-22 DIAGNOSIS — G20 Parkinson's disease: Secondary | ICD-10-CM | POA: Diagnosis not present

## 2018-01-22 DIAGNOSIS — F4323 Adjustment disorder with mixed anxiety and depressed mood: Secondary | ICD-10-CM | POA: Diagnosis not present

## 2018-01-22 DIAGNOSIS — R278 Other lack of coordination: Secondary | ICD-10-CM | POA: Diagnosis not present

## 2018-01-22 DIAGNOSIS — R293 Abnormal posture: Secondary | ICD-10-CM | POA: Diagnosis not present

## 2018-01-22 DIAGNOSIS — R2681 Unsteadiness on feet: Secondary | ICD-10-CM | POA: Diagnosis not present

## 2018-01-22 DIAGNOSIS — F331 Major depressive disorder, recurrent, moderate: Secondary | ICD-10-CM

## 2018-01-22 DIAGNOSIS — R2689 Other abnormalities of gait and mobility: Secondary | ICD-10-CM | POA: Diagnosis not present

## 2018-01-22 DIAGNOSIS — R279 Unspecified lack of coordination: Secondary | ICD-10-CM | POA: Diagnosis not present

## 2018-01-22 DIAGNOSIS — M6281 Muscle weakness (generalized): Secondary | ICD-10-CM | POA: Diagnosis not present

## 2018-01-22 DIAGNOSIS — R262 Difficulty in walking, not elsewhere classified: Secondary | ICD-10-CM | POA: Diagnosis not present

## 2018-01-22 DIAGNOSIS — R296 Repeated falls: Secondary | ICD-10-CM | POA: Diagnosis not present

## 2018-01-22 NOTE — Therapy (Addendum)
[place] MAIN [facility] SERVICES [address] [place], [place], [address] Phone: [phone]   Fax:  [phone]  Occupational Therapy Treatment  Patient Details  Name: [name] MRN: [id number] Date of Birth: 1950/05/05 No data recorded  Encounter Date: 01/22/2018  OT End of Session - 01/22/18 1055    Visit Number  25    Number of Visits  48    Date for OT Re-Evaluation  04/13/18    Authorization Type  Visit 4 of 10 for progress reporting period starting 12/14/2017    OT Start Time  1015    OT Stop Time  1100    OT Time Calculation (min)  45 min    Activity Tolerance  Patient tolerated treatment well    Behavior During Therapy  [facility] for tasks assessed/performed       Past Medical History:  Diagnosis Date  . Arthritis   . Bradycardia   . Cancer [facility]) 2013   skin cancer  . Depression    ptsd  . Dysrhythmia    chronic slow heart rate  . GERD (gastroesophageal reflux disease)   . Headache(784.0)    tension headaches non recent  . History of chicken pox   . History of kidney stones    passed  . Hypertension    treated with HCTZ  . Parkinson's disease ([place])    dx'ed 15 years ago  . PTSD (post-traumatic stress disorder)   . Shortness of breath dyspnea   . Sleep apnea    doesn't use C-pap  . Varicose veins     Past Surgical History:  Procedure Laterality Date  . CHOLECYSTECTOMY N/A 10/22/2014   Procedure: LAPAROSCOPIC CHOLECYSTECTOMY WITH INTRAOPERATIVE CHOLANGIOGRAM;  Surgeon: Dia Crawford III, MD;  Location: [facility] ORS;  Service: General;  Laterality: N/A;  . cyst removed      from lip as a child  . LUMBAR LAMINECTOMY/DECOMPRESSION MICRODISCECTOMY Bilateral 12/14/2012   Procedure: Bilateral lumbar three-four, four-five decompressive laminotomy/foraminotomy;  Surgeon: Charlie Pitter, MD;  Location: [place] NEURO ORS;  Service: Neurosurgery;  Laterality: Bilateral;  . PULSE GENERATOR IMPLANT Bilateral 12/13/2013   Procedure: Bilateral  implantable pulse generator placement;  Surgeon: Erline Levine, MD;  Location: [place] NEURO ORS;  Service: Neurosurgery;  Laterality: Bilateral;  Bilateral implantable pulse generator placement  . skin cancer removed     from ears,   12 lft arm  rt leg 15  . SUBTHALAMIC STIMULATOR BATTERY REPLACEMENT Bilateral 07/14/2017   Procedure: BILATERAL IMPLANTED PULSE GENERATOR CHANGE FOR DEEP BRAIN STIMULATOR;  Surgeon: Erline Levine, MD;  Location: [place];  Service: Neurosurgery;  Laterality: Bilateral;  . SUBTHALAMIC STIMULATOR INSERTION Bilateral 12/06/2013   Procedure: SUBTHALAMIC STIMULATOR INSERTION;  Surgeon: Erline Levine, MD;  Location: [place] NEURO ORS;  Service: Neurosurgery;  Laterality: Bilateral;  Bilateral deep brain stimulator placement    There were no vitals filed for this visit.  Subjective Assessment - 01/22/18 1052    Subjective   Pt reports that he had a good weekend and is doing well today    Pertinent History  Pt. is a 67 y.o. mlae who has a history of Parkinson's Disease. Pt. has a Deep Brain Stimulator in place. Pt. has a history of Low Back Pain with 2 back surgeries in August 2014, and February 2016. Pt. has a history of multiple frequent falls occurring at a rate of 6x's a week.     Limitations  Frequent falls, limited motor control, and [place].  Patient Stated Goals  To return home.    Currently in Pain?  No/denies    Pain Score  0-No pain      OT TREATMENT  Self-care:  Pt completed typing task that required him to type a sample for 5 minutes. Pt typed slowly and deliberately during the task. Pt typed at a speed of 3 wpm and made 15 errors. Pt was instructed to type a sentence that he formulated. Pt demonstrated increase errors and decreased typing speed when using the home row keys. When typing the same sentence, while not using the home row keys, pt typed at the same speed and demonstrated fewer errors. Pt was encouraged to follow the cursor on the screen and use the backspace key  when he noticed errors. Pt completed writing tasks that required him to formulate and write lists according to categories identified by therapist. Pt wrote slowly and formed letters very small. Pt noted that he preferred to write in cursive (~40% legibility) however demonstrates increased legibility when writing in print. Pt used a standard pen with a large grip and lined paper. Pt was educated to increase the size of his letters to improve the legibility. Pt noted that writing larger felt "awkward" but noticed that his writing was more legible.                     OT Education - 01/22/18 1053    Education provided  Yes    Education Details  Typing skills, Sales promotion account executive) Educated  Patient    Methods  Verbal cues;Explanation;Demonstration    Comprehension  Verbal cues required;Verbalized understanding;Returned demonstration          OT Long Term Goals - 01/19/18 0945      OT LONG TERM GOAL #1   Title  Pt. will improve Bilateral UE strength by 1 mm grade to assist with ADLs, and IADLs    Baseline  BUE strength: 4+/5 overall    Time  12    Period  Weeks    Status  Achieved      OT LONG TERM GOAL #2   Title  Pt. will improve grip strength to be able to pour himself a beverage.    Baseline  Pt. continues to be unable to hold and pour a bevrage.    Time  12    Period  Weeks    Status  Achieved    Target Date  --      OT LONG TERM GOAL #3   Title  Pt. will improve lateral pinch stength to be able to independently turn a key.     Baseline  Pt. continues to have difficulty holding, and turning  key.    Time  12    Period  Weeks    Status  Achieved      OT LONG TERM GOAL #4   Title  Pt. will improve Right hand [facility] skills to be able to independently grasp and use small objects needed for fixing car parts.    Baseline   45sec. on the 9 hole peg test. 01/19/2018 56 sec. on 9 hole peg test.    Time  12    Period  Weeks    Status  On-going    Target  Date  04/13/18      OT LONG TERM GOAL #5   Title  Pt. will be demonstrate self-feeding with modified independence with minimal spillage.    Baseline  Pt. continues to require assistance, and has alot of spillage.    Period  Weeks    Status  On-going    Target Date  04/13/18      Long Term Additional Goals   Additional Long Term Goals  Yes      OT LONG TERM GOAL #6   Title  Pt. will manipulate buttons, and cuff buttons independently.    Baseline   Pt. has difficulty manipulating buttons    Time  12    Period  Weeks    Status  On-going    Target Date  04/13/18      OT LONG TERM GOAL #7   Title  Pt. will write 1 sentence efficiently with 90% legibility.    Baseline  name only 75% legibility    Time  12    Period  Weeks    Status  On-going    Target Date  04/13/18      OT LONG TERM GOAL #8   Title  Pt. will improve typing speed, and accuracy to be able to to type emails efficently    Baseline  Typing speed: 6 wpm, Accuracy: 33% with moderate cues for hand placement    Time  12    Period  Weeks    Status  On-going    Target Date  04/13/18      OT LONG TERM GOAL  #9   Baseline  Pt will transfers in and out of the shower with stand-by assistance    Time  12    Period  Weeks    Status  New    Target Date  04/13/18      OT LONG TERM GOAL  #10   TITLE  Pt will don and doff pants with min A, PRN    Baseline  Pt requires mod A to don and doff pants    Time  12    Period  Weeks    Status  New    Target Date  04/13/18      OT LONG TERM GOAL  #11   TITLE  Pt will don and doff socks modified independently using AE, PRN    Baseline  Pt has increased difficulty donning and doffing socks    Time  6    Period  Weeks    Status  New    Target Date  03/02/18      OT LONG TERM GOAL  #12   TITLE  Pt will independently tie shoes    Baseline  Pt requires assistance to tie shoes    Time  6    Period  Weeks    Status  New    Target Date  03/02/18            Plan -  01/22/18 1055    Clinical Impression Statement  Pt completed typing and writing tasks today. Pt continues to show deficits in typing speed and accuracy. Pt demonstrates increased typing accuracy when he is able to remove hands from keyboard to see keys. Pt demonstrates decreased accuracy when attempting to keep digits on homerow keys. Pt consistently demonstrates micrographia and decreased legibility when writing. Pt used rules paper and demonstrated increased legibility.    Occupational performance deficits (Please refer to evaluation for details):  ADL's;IADL's    Rehab Potential  Good    Current Impairments/barriers affecting progress:  Positive Indicators: age, familiy support, motivation. Negative Indicators: Multiple comorbidities, and history of falls.  OT Frequency  2x / week    OT Duration  12 weeks    OT Treatment/Interventions  Self-care/ADL training;Therapeutic exercise;DME and/or AE instruction;Energy conservation;Neuromuscular education;Patient/family education;Therapeutic activities    Clinical Decision Making  Several treatment options, min-mod task modification necessary    OT Home Exercise Plan  HEP for using rubber bands for strengthening at home and work on more handwriting.    Consulted and Agree with Plan of Care  Patient       Patient will benefit from skilled therapeutic intervention in order to improve the following deficits and impairments:  Pain, Decreased strength, Decreased coordination, Decreased balance, Impaired UE functional use, Decreased safety awareness, Decreased activity tolerance, Decreased cognition, Impaired flexibility, Impaired vision/preception  Visit Diagnosis: Other lack of coordination    Problem List Patient Active Problem List   Diagnosis Date Noted  . Skin sore 12/01/2017  . Hematoma 10/18/2017  . Accidental fall 10/18/2017  . 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 Radicular pain in right arm 10/09/2016  . GERD (gastroesophageal reflux  disease) 07/31/2016  . Trochanteric bursitis 03/03/2016  . Left hand pain 10/30/2015  . Fracture, finger, multiple sites 10/30/2015  . Colon cancer screening 07/23/2015  . Healthcare maintenance 07/23/2015  . Gout 02/18/2015  . Depression 01/06/2015  . Irritation of eyelid 08/20/2014  . Dupuytren's contracture 08/20/2014  . Cough 07/21/2014  . Lumbar stenosis with neurogenic claudication 06/13/2014  . Preop cardiovascular exam 05/28/2014  . S/P deep brain stimulator placement 05/08/2014  . Joint pain 01/22/2014  . Medicare annual wellness visit, initial 01/19/2014  . Advance care planning 01/19/2014  . Parkinson's disease ([place]) 12/13/2013  . PTSD (post-traumatic stress disorder) 06/13/2013  . Erectile dysfunction 06/13/2013  . HLD (hyperlipidemia) 06/13/2013  . Hip pain 06/10/2013  . Pain in joint, shoulder region 06/10/2013  . Right leg swelling 06/03/2013  . Essential hypertension 06/03/2013  . Bradycardia by electrocardiogram 06/03/2013  . Obstructive sleep apnea 03/12/2013  . Spinal stenosis, lumbar region, with neurogenic claudication 12/14/2012    [name], [name] 01/22/2018, 12:11 PM   This entire session was performed under direct supervision and direction of a licensed therapist/therapist assistant . I have personally read, edited and approve of the note as written.  George Carina, MS, OTR/L   [place] MAIN [facility] SERVICES [address] [place], [place], [address] Phone: [phone]   Fax:  [phone]  Name: JLYN BRACAMONTE MRN: [id number] Date of Birth: 1951/03/03

## 2018-01-23 ENCOUNTER — Ambulatory Visit: Payer: Medicare PPO

## 2018-01-25 ENCOUNTER — Other Ambulatory Visit: Payer: Self-pay

## 2018-01-25 ENCOUNTER — Ambulatory Visit: Payer: Medicare PPO

## 2018-01-25 DIAGNOSIS — R293 Abnormal posture: Secondary | ICD-10-CM

## 2018-01-25 DIAGNOSIS — R2681 Unsteadiness on feet: Secondary | ICD-10-CM

## 2018-01-25 DIAGNOSIS — R29818 Other symptoms and signs involving the nervous system: Secondary | ICD-10-CM

## 2018-01-25 DIAGNOSIS — R29898 Other symptoms and signs involving the musculoskeletal system: Secondary | ICD-10-CM

## 2018-01-25 DIAGNOSIS — R262 Difficulty in walking, not elsewhere classified: Secondary | ICD-10-CM | POA: Diagnosis not present

## 2018-01-25 DIAGNOSIS — R296 Repeated falls: Secondary | ICD-10-CM | POA: Diagnosis not present

## 2018-01-25 DIAGNOSIS — R278 Other lack of coordination: Secondary | ICD-10-CM | POA: Diagnosis not present

## 2018-01-25 DIAGNOSIS — R2689 Other abnormalities of gait and mobility: Secondary | ICD-10-CM

## 2018-01-25 DIAGNOSIS — G20 Parkinson's disease: Secondary | ICD-10-CM | POA: Diagnosis not present

## 2018-01-25 DIAGNOSIS — R279 Unspecified lack of coordination: Secondary | ICD-10-CM | POA: Diagnosis not present

## 2018-01-25 DIAGNOSIS — M6281 Muscle weakness (generalized): Secondary | ICD-10-CM | POA: Diagnosis not present

## 2018-01-25 NOTE — Therapy (Signed)
[place] MAIN [facility] SERVICES [address] [place], [place], [address] Phone: [phone]   Fax:  [phone]  Physical Therapy Treatment  Patient Details  Name: [name] MRN: [id number] Date of Birth: [birth year] Referring Provider (PT): Alonza Bogus   Encounter Date: 01/25/2018  PT End of Session - 01/25/18 1146    Visit Number  59    Number of Visits  73    Date for PT Re-Evaluation  03/08/18    Authorization Type  3/10 starts 9/26    PT Start Time  0930    PT Stop Time  1025    PT Time Calculation (min)  55 min    Activity Tolerance  Patient tolerated treatment well;Other (comment)    Behavior During Therapy  WFL for tasks assessed/performed       Past Medical History:  Diagnosis Date  . Arthritis   . Bradycardia   . Cancer [facility]) 2013   skin cancer  . Depression    ptsd  . Dysrhythmia    chronic slow heart rate  . GERD (gastroesophageal reflux disease)   . Headache(784.0)    tension headaches non recent  . History of chicken pox   . History of kidney stones    passed  . Hypertension    treated with HCTZ  . Parkinson's disease ([place])    dx'ed 15 years ago  . PTSD (post-traumatic stress disorder)   . Shortness of breath dyspnea   . Sleep apnea    doesn't use C-pap  . Varicose veins     Past Surgical History:  Procedure Laterality Date  . CHOLECYSTECTOMY N/A 10/22/2014   Procedure: LAPAROSCOPIC CHOLECYSTECTOMY WITH INTRAOPERATIVE CHOLANGIOGRAM;  Surgeon: Dia Crawford III, MD;  Location: [facility] ORS;  Service: General;  Laterality: N/A;  . cyst removed      from lip as a child  . LUMBAR LAMINECTOMY/DECOMPRESSION MICRODISCECTOMY Bilateral 12/14/2012   Procedure: Bilateral lumbar three-four, four-five decompressive laminotomy/foraminotomy;  Surgeon: Charlie Pitter, MD;  Location: [place] NEURO ORS;  Service: Neurosurgery;  Laterality: Bilateral;  . PULSE GENERATOR IMPLANT Bilateral 12/13/2013   Procedure: Bilateral implantable  pulse generator placement;  Surgeon: Erline Levine, MD;  Location: [place] NEURO ORS;  Service: Neurosurgery;  Laterality: Bilateral;  Bilateral implantable pulse generator placement  . skin cancer removed     from ears,   12 lft arm  rt leg 15  . SUBTHALAMIC STIMULATOR BATTERY REPLACEMENT Bilateral 07/14/2017   Procedure: BILATERAL IMPLANTED PULSE GENERATOR CHANGE FOR DEEP BRAIN STIMULATOR;  Surgeon: Erline Levine, MD;  Location: [place];  Service: Neurosurgery;  Laterality: Bilateral;  . SUBTHALAMIC STIMULATOR INSERTION Bilateral 12/06/2013   Procedure: SUBTHALAMIC STIMULATOR INSERTION;  Surgeon: Erline Levine, MD;  Location: [place] NEURO ORS;  Service: Neurosurgery;  Laterality: Bilateral;  Bilateral deep brain stimulator placement    There were no vitals filed for this visit.  Subjective Assessment - 01/25/18 1140    Subjective  No voiced complaints. Pt/spouse report pt received a new w/c with anti tippers for home/community use when not using electric chair. Denies pain today    Patient is accompained by:  Family member    Pertinent History  Patient referred for physical therapy due to wife's request for patient to get aquatic therapy to help with his conditioning. Pt is also complaining of chronic low back and R hip pain and frequent falls. 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  The last time he was in the water he was very uncomfortable because he didn't feel like he had any control. Pt has a history of 2 low back surgeries in August 2014 and February 2016. Pt and wife are unable to recall exactly what was performed but they know that he had stenosis. Surgeries were performed by Dr. Annette Stable in [place]. Pt reports that his low back and R hip pain are constant. He describes the pain in the back as "dull" and the pain in the hip as "sharp." For both back and hip worst pain is 10/10, Best: 2/10, Present: 2/10. No particular episode of trauma to his low  back or hip preceding the pain. Pt has a history of Parkinson's with a deep brain stimulator. He ambulates with a 4 wheeled walker called a U-step (automatic brakes, audio cues for stepping). He states that he falls frequently and reports that he has had approximately 40-50 falls in the last 6 months. No specific recent changes in his health.       3# ankle wts use throughout session  Ambulation, blue dumbbells  4 L fwd  4 L side  2 L bkwd  4 L high knee  Resisted squat with 1 blue dumbell, 20x   TUG, 10x with round trip of 28 ft; times 35 sec initially with decreased trend to 23 sec  Seated up and outs, 2 x 10 B  High plank hip ext, B 2 x 10 ea  5 min seated bike                         PT Education - 01/25/18 1143    Education provided  Yes    Education Details  Timed up and go in water using 28 ft (1 way of 14 ft)    Person(s) Educated  Patient    Methods  Explanation;Demonstration;Verbal cues    Comprehension  Verbalized understanding;Returned demonstration;Verbal cues required       PT Short Term Goals - 01/11/18 1334      PT SHORT TERM GOAL #1   Title  Pt will perform HEP with family's supervision, for improved balance, transfers, and gait.      Baseline  patient reports compliance, however not every day; was unable to preform at the away home    Time  4    Period  Weeks    Status  Partially Met      PT SHORT TERM GOAL #2   Title  Pt will perform 5x sit<>stand in less than or equal to 15 seconds for improved efficiency and safety with transfers.    Baseline  19 seconds ; 15 seconds    Time  2    Period  Weeks    Status  Achieved        PT Long Term Goals - 01/11/18 1334      PT LONG TERM GOAL #1   Title  Pt will decrease 5TSTS by at least 3 seconds in order to demonstrate clinically significant improvement in LE strength     Baseline  02/08/17: 20.1 seconds 12/18: 17 seconds ; 09/14/17 =24.34 sec with assist due to poor static standing  balance    Time  8    Period  Weeks    Status  Achieved      PT LONG TERM GOAL #2   Title  Pt will decrease TUG by at least 3.4s in order to demonstrate decreased fall risk  Baseline  02/08/17: 22.1s 12/18: 20 seconds 2/7: 19 seconds; 4/3: 23 seconds with walker, 09/14/17 15.12 sec 7/23: 15 seconds RW 8/22: no longer ambulatory    Time  8    Period  Weeks    Status  Deferred      PT LONG TERM GOAL #3   Title  Pt will improve BERG by at least 3 points in order to demonstrate clinically significant improvement in balance.    Baseline  02/08/17: 30/56 12/18: 38/56      Time  8    Period  Weeks    Status  Achieved      PT LONG TERM GOAL #4   Title  Pt/wife will verbalize understanding of plans for continued community fitness upon D/C from PT including aquatic therapy program    Baseline  02/08/17: pt currently reports being more comfortable in the water    Time  8    Period  Weeks    Status  Partially Met    Target Date  03/08/18      PT LONG TERM GOAL #5   Title  Pt will decrease mODI scoreby at least 13 points in order demonstrate clinically significant reduction in pain/disability     Baseline  02/08/17: 54% 12/18: 46% 2/7: 42% 4/3; 50%, 09/14/17 42% 7/23: 42% 8/22 8/22:  31/56 9/26: 60%    Time  8    Period  Weeks    Status  Partially Met    Target Date  03/08/18      PT LONG TERM GOAL #6   Title  Pt will improve BERG to 44/56  in order to demonstrate clinically significant improvement in balance for decreased fall risk    Baseline  12/18: 38/56 2/7: 41/56 4/3: 41/56, 09/14/17  19/56 7/23: 44/56 9/26: 20/56    Time  8    Period  Weeks    Status  On-going    Target Date  03/08/18      PT LONG TERM GOAL #7   Title  Patient will reduce number of falls per week to <3 to reduce chance of injury and decrease frequent LOB.     Baseline  8/22: 6x/week 9/26: 2x/ day     Time  8    Period  Weeks    Status  On-going    Target Date  03/08/18      PT LONG TERM GOAL #8   Title   Patient will increase functional strength to 4+/5 to improve functional mobility and safety with transfers.     Baseline  8/22: 4-/5 gross 9/26: 3+/5    Time  8    Period  Weeks    Status  On-going    Target Date  03/08/18            Plan - 01/25/18 1147    Clinical Impression Statement  B 3# ankle wts use during session for grounding. Pt demonstrated a good day with improved overall balance, self balance correction when needed, improved technique with strength exercises demonstrating improved strength. TUG activity speed improved over 10 runs from 35 seconds to 23 seconds. Pt required STS assist on land for drying/dressing, as spouse who normally assists pt was ill and experiencing dizziness; nusing staff called to assist her. Family Pastor called and took over care to see both pt and spouse home. Continue to progress strenth, balance and coordination.     Rehab Potential  Fair    PT Frequency  2x /  week    PT Duration  8 weeks    PT Treatment/Interventions  ADLs/Self Care Home Management;Aquatic Therapy;Cryotherapy;Electrical Stimulation;Iontophoresis '4mg'$ /ml Dexamethasone;Moist Heat;Ultrasound;Contrast Bath;DME Instruction;Gait training;Stair training;Functional mobility training;Therapeutic activities;Therapeutic exercise;Balance training;Neuromuscular re-education;Patient/family education;Manual techniques;Wheelchair mobility training;Energy conservation;Passive range of motion    PT Next Visit Plan  progress balance, strength, aquatic therapy    PT Home Exercise Plan  seated strengthening    Consulted and Agree with Plan of Care  Patient    Family Member Consulted  wife       Patient will benefit from skilled therapeutic intervention in order to improve the following deficits and impairments:  Abnormal gait, Decreased balance, Decreased coordination, Decreased mobility, Impaired tone, Postural dysfunction, Decreased strength, Decreased safety awareness, Improper body mechanics,  Impaired flexibility  Visit Diagnosis: Other lack of coordination  Muscle weakness (generalized)  Repeated falls  Other abnormalities of gait and mobility  Unsteadiness on feet  Unspecified lack of coordination  Abnormal posture  Difficulty walking  Parkinson's disease ([facility])  Other symptoms and signs involving the musculoskeletal system  Other symptoms and signs involving the nervous system     Problem List Patient Active Problem List   Diagnosis Date Noted  . Skin sore 12/01/2017  . Hematoma 10/18/2017  . Accidental fall 10/18/2017  . 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 Radicular pain in right arm 10/09/2016  . GERD (gastroesophageal reflux disease) 07/31/2016  . Trochanteric bursitis 03/03/2016  . Left hand pain 10/30/2015  . Fracture, finger, multiple sites 10/30/2015  . Colon cancer screening 07/23/2015  . Healthcare maintenance 07/23/2015  . Gout 02/18/2015  . Depression 01/06/2015  . Irritation of eyelid 08/20/2014  . Dupuytren's contracture 08/20/2014  . Cough 07/21/2014  . Lumbar stenosis with neurogenic claudication 06/13/2014  . Preop cardiovascular exam 05/28/2014  . S/P deep brain stimulator placement 05/08/2014  . Joint pain 01/22/2014  . Medicare annual wellness visit, initial 01/19/2014  . Advance care planning 01/19/2014  . Parkinson's disease ([place]) 12/13/2013  . PTSD (post-traumatic stress disorder) 06/13/2013  . Erectile dysfunction 06/13/2013  . HLD (hyperlipidemia) 06/13/2013  . Hip pain 06/10/2013  . Pain in joint, shoulder region 06/10/2013  . Right leg swelling 06/03/2013  . Essential hypertension 06/03/2013  . Bradycardia by electrocardiogram 06/03/2013  . Obstructive sleep apnea 03/12/2013  . Spinal stenosis, lumbar region, with neurogenic claudication 12/14/2012    Larae Grooms 01/25/2018, 11:54 AM  [place] MAIN [facility] SERVICES [place], [place], [address] Phone:  [phone]   Fax:  [phone]  Name: [name] MRN: [id number] Date of Birth: 09-Oct-1950

## 2018-01-29 ENCOUNTER — Ambulatory Visit: Payer: Medicare PPO | Admitting: Occupational Therapy

## 2018-01-30 ENCOUNTER — Ambulatory Visit: Payer: Self-pay

## 2018-02-01 ENCOUNTER — Ambulatory Visit (INDEPENDENT_AMBULATORY_CARE_PROVIDER_SITE_OTHER): Payer: Medicare PPO

## 2018-02-01 ENCOUNTER — Other Ambulatory Visit: Payer: Self-pay

## 2018-02-01 ENCOUNTER — Ambulatory Visit: Payer: Medicare PPO

## 2018-02-01 ENCOUNTER — Telehealth: Payer: Self-pay | Admitting: Family Medicine

## 2018-02-01 DIAGNOSIS — R29898 Other symptoms and signs involving the musculoskeletal system: Secondary | ICD-10-CM

## 2018-02-01 DIAGNOSIS — M545 Low back pain, unspecified: Secondary | ICD-10-CM

## 2018-02-01 DIAGNOSIS — M48062 Spinal stenosis, lumbar region with neurogenic claudication: Secondary | ICD-10-CM

## 2018-02-01 DIAGNOSIS — R279 Unspecified lack of coordination: Secondary | ICD-10-CM | POA: Diagnosis not present

## 2018-02-01 DIAGNOSIS — Z23 Encounter for immunization: Secondary | ICD-10-CM | POA: Diagnosis not present

## 2018-02-01 DIAGNOSIS — R2689 Other abnormalities of gait and mobility: Secondary | ICD-10-CM | POA: Diagnosis not present

## 2018-02-01 DIAGNOSIS — R29818 Other symptoms and signs involving the nervous system: Secondary | ICD-10-CM

## 2018-02-01 DIAGNOSIS — R2681 Unsteadiness on feet: Secondary | ICD-10-CM

## 2018-02-01 DIAGNOSIS — R296 Repeated falls: Secondary | ICD-10-CM | POA: Diagnosis not present

## 2018-02-01 DIAGNOSIS — R293 Abnormal posture: Secondary | ICD-10-CM | POA: Diagnosis not present

## 2018-02-01 DIAGNOSIS — R278 Other lack of coordination: Secondary | ICD-10-CM

## 2018-02-01 DIAGNOSIS — M6281 Muscle weakness (generalized): Secondary | ICD-10-CM

## 2018-02-01 DIAGNOSIS — R262 Difficulty in walking, not elsewhere classified: Secondary | ICD-10-CM | POA: Diagnosis not present

## 2018-02-01 DIAGNOSIS — G8929 Other chronic pain: Secondary | ICD-10-CM

## 2018-02-01 DIAGNOSIS — G4733 Obstructive sleep apnea (adult) (pediatric): Secondary | ICD-10-CM | POA: Diagnosis not present

## 2018-02-01 DIAGNOSIS — G20 Parkinson's disease: Secondary | ICD-10-CM | POA: Diagnosis not present

## 2018-02-01 NOTE — Therapy (Signed)
[place] MAIN [facility] SERVICES [address] [place], [place], [address] Phone: [phone]   Fax:  [phone]  Physical Therapy Treatment  Patient Details  Name: [name] MRN: [id number] Date of Birth: 02-Apr-1951 Referring Provider (PT): Alonza Bogus   Encounter Date: 02/01/2018  PT End of Session - 02/01/18 1211    Visit Number  60    Number of Visits  73    Date for PT Re-Evaluation  03/08/18    Authorization Type  4/10    PT Start Time  0930    PT Stop Time  1015    PT Time Calculation (min)  45 min    Activity Tolerance  Patient tolerated treatment well;Other (comment)    Behavior During Therapy  WFL for tasks assessed/performed       Past Medical History:  Diagnosis Date  . Arthritis   . Bradycardia   . Cancer [facility]) 2013   skin cancer  . Depression    ptsd  . Dysrhythmia    chronic slow heart rate  . GERD (gastroesophageal reflux disease)   . Headache(784.0)    tension headaches non recent  . History of chicken pox   . History of kidney stones    passed  . Hypertension    treated with HCTZ  . Parkinson's disease ([place])    dx'ed 15 years ago  . PTSD (post-traumatic stress disorder)   . Shortness of breath dyspnea   . Sleep apnea    doesn't use C-pap  . Varicose veins     Past Surgical History:  Procedure Laterality Date  . CHOLECYSTECTOMY N/A 10/22/2014   Procedure: LAPAROSCOPIC CHOLECYSTECTOMY WITH INTRAOPERATIVE CHOLANGIOGRAM;  Surgeon: Dia Crawford III, MD;  Location: [facility] ORS;  Service: General;  Laterality: N/A;  . cyst removed      from lip as a child  . LUMBAR LAMINECTOMY/DECOMPRESSION MICRODISCECTOMY Bilateral 12/14/2012   Procedure: Bilateral lumbar three-four, four-five decompressive laminotomy/foraminotomy;  Surgeon: Charlie Pitter, MD;  Location: [place] NEURO ORS;  Service: Neurosurgery;  Laterality: Bilateral;  . PULSE GENERATOR IMPLANT Bilateral 12/13/2013   Procedure: Bilateral implantable pulse generator  placement;  Surgeon: Erline Levine, MD;  Location: [place] NEURO ORS;  Service: Neurosurgery;  Laterality: Bilateral;  Bilateral implantable pulse generator placement  . skin cancer removed     from ears,   12 lft arm  rt leg 15  . SUBTHALAMIC STIMULATOR BATTERY REPLACEMENT Bilateral 07/14/2017   Procedure: BILATERAL IMPLANTED PULSE GENERATOR CHANGE FOR DEEP BRAIN STIMULATOR;  Surgeon: Erline Levine, MD;  Location: [place];  Service: Neurosurgery;  Laterality: Bilateral;  . SUBTHALAMIC STIMULATOR INSERTION Bilateral 12/06/2013   Procedure: SUBTHALAMIC STIMULATOR INSERTION;  Surgeon: Erline Levine, MD;  Location: [place] NEURO ORS;  Service: Neurosurgery;  Laterality: Bilateral;  Bilateral deep brain stimulator placement    There were no vitals filed for this visit.  Subjective Assessment - 02/01/18 1204    Subjective  Pt/spouse report 1 fall since last session; fall happened during a transfer from w/c. Pt denies injury; denies current pain. Pt states he purchased 15# wts for upper body work independently at home. Advised pt/spouse that 15# may be over zealous and cautioned against repetive heavy lifting to avoid shoulder tendinitis type pain, LB pain due to poor stabilization strength with heavy wts and advised against over head work to avoid dropping wt on self. Suggested pt to purches lighter (7 or 8# wt) and even lighter for overhead to start for ability to perform variety  of exercises safely.     Patient is accompained by:  Family member    Pertinent History  Patient referred for physical therapy due to wife's request for patient to get aquatic therapy to help with his conditioning. Pt is also complaining of chronic low back and R hip pain and frequent falls. 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 The last time he was in the water he was very uncomfortable because he didn't feel like he had any control. Pt has a history of 2 low back surgeries in August  2014 and February 2016. Pt and wife are unable to recall exactly what was performed but they know that he had stenosis. Surgeries were performed by Dr. Annette Stable in [place]. Pt reports that his low back and R hip pain are constant. He describes the pain in the back as "dull" and the pain in the hip as "sharp." For both back and hip worst pain is 10/10, Best: 2/10, Present: 2/10. No particular episode of trauma to his low back or hip preceding the pain. Pt has a history of Parkinson's with a deep brain stimulator. He ambulates with a 4 wheeled walker called a U-step (automatic brakes, audio cues for stepping). He states that he falls frequently and reports that he has had approximately 40-50 falls in the last 6 months. No specific recent changes in his health.       Enters/exits via ramp with Assist 3# ankle wts for grounding  Ambulation, blue dumbbells  4 L fwd  2 L side  2 L side squat  4 L bkwd  Core with LE strength, 20x ea  Hip abd/add  Hip flex/ext  Bench  STS 20x, no A  TUG activity (14') 5x (23, 26, 25, 23, 20 seconds)  High knee march 2 L   Race walk 2 L                             PT Education - 02/01/18 1210    Education provided  Yes    Education Details  Safety/conservative approach to beginning new independent exercises ( Upper body strengthening with free wts)    Person(s) Educated  Patient;Spouse    Methods  Explanation    Comprehension  Verbalized understanding       PT Short Term Goals - 01/11/18 1334      PT SHORT TERM GOAL #1   Title  Pt will perform HEP with family's supervision, for improved balance, transfers, and gait.      Baseline  patient reports compliance, however not every day; was unable to preform at the away home    Time  4    Period  Weeks    Status  Partially Met      PT SHORT TERM GOAL #2   Title  Pt will perform 5x sit<>stand in less than or equal to 15 seconds for improved efficiency and safety with transfers.     Baseline  19 seconds ; 15 seconds    Time  2    Period  Weeks    Status  Achieved        PT Long Term Goals - 01/11/18 1334      PT LONG TERM GOAL #1   Title  Pt will decrease 5TSTS by at least 3 seconds in order to demonstrate clinically significant improvement in LE strength  Baseline  02/08/17: 20.1 seconds 12/18: 17 seconds ; 09/14/17 =24.34 sec with assist due to poor static standing balance    Time  8    Period  Weeks    Status  Achieved      PT LONG TERM GOAL #2   Title  Pt will decrease TUG by at least 3.4s in order to demonstrate decreased fall risk     Baseline  02/08/17: 22.1s 12/18: 20 seconds 2/7: 19 seconds; 4/3: 23 seconds with walker, 09/14/17 15.12 sec 7/23: 15 seconds RW 8/22: no longer ambulatory    Time  8    Period  Weeks    Status  Deferred      PT LONG TERM GOAL #3   Title  Pt will improve BERG by at least 3 points in order to demonstrate clinically significant improvement in balance.    Baseline  02/08/17: 30/56 12/18: 38/56      Time  8    Period  Weeks    Status  Achieved      PT LONG TERM GOAL #4   Title  Pt/wife will verbalize understanding of plans for continued community fitness upon D/C from PT including aquatic therapy program    Baseline  02/08/17: pt currently reports being more comfortable in the water    Time  8    Period  Weeks    Status  Partially Met    Target Date  03/08/18      PT LONG TERM GOAL #5   Title  Pt will decrease mODI scoreby at least 13 points in order demonstrate clinically significant reduction in pain/disability     Baseline  02/08/17: 54% 12/18: 46% 2/7: 42% 4/3; 50%, 09/14/17 42% 7/23: 42% 8/22 8/22:  31/56 9/26: 60%    Time  8    Period  Weeks    Status  Partially Met    Target Date  03/08/18      PT LONG TERM GOAL #6   Title  Pt will improve BERG to 44/56  in order to demonstrate clinically significant improvement in balance for decreased fall risk    Baseline  12/18: 38/56 2/7: 41/56 4/3: 41/56, 09/14/17   19/56 7/23: 44/56 9/26: 20/56    Time  8    Period  Weeks    Status  On-going    Target Date  03/08/18      PT LONG TERM GOAL #7   Title  Patient will reduce number of falls per week to <3 to reduce chance of injury and decrease frequent LOB.     Baseline  8/22: 6x/week 9/26: 2x/ day     Time  8    Period  Weeks    Status  On-going    Target Date  03/08/18      PT LONG TERM GOAL #8   Title  Patient will increase functional strength to 4+/5 to improve functional mobility and safety with transfers.     Baseline  8/22: 4-/5 gross 9/26: 3+/5    Time  8    Period  Weeks    Status  On-going    Target Date  03/08/18            Plan - 02/01/18 1212    Clinical Impression Statement  3# ankle wts throughout session for grounding purposes. Pt demonstrating better balance today with minimal need for assisted balance/support during activity. TUG activity for 5 runs improved from greatest being 26 seconds to least of  20 seconds. (14') walk    Rehab Potential  Fair    PT Frequency  2x / week    PT Duration  8 weeks    PT Treatment/Interventions  ADLs/Self Care Home Management;Aquatic Therapy;Cryotherapy;Electrical Stimulation;Iontophoresis 37m/ml Dexamethasone;Moist Heat;Ultrasound;Contrast Bath;DME Instruction;Gait training;Stair training;Functional mobility training;Therapeutic activities;Therapeutic exercise;Balance training;Neuromuscular re-education;Patient/family education;Manual techniques;Wheelchair mobility training;Energy conservation;Passive range of motion    PT Next Visit Plan  progress balance, strength, aquatic therapy    PT Home Exercise Plan  seated strengthening    Consulted and Agree with Plan of Care  Patient    Family Member Consulted  wife       Patient will benefit from skilled therapeutic intervention in order to improve the following deficits and impairments:  Abnormal gait, Decreased balance, Decreased coordination, Decreased mobility, Impaired tone, Postural  dysfunction, Decreased strength, Decreased safety awareness, Improper body mechanics, Impaired flexibility  Visit Diagnosis: Other lack of coordination  Muscle weakness (generalized)  Repeated falls  Other abnormalities of gait and mobility  Unsteadiness on feet  Unspecified lack of coordination  Abnormal posture  Difficulty walking  Parkinson's disease (H[facility])  Other symptoms and signs involving the musculoskeletal system  Other symptoms and signs involving the nervous system  Chronic midline low back pain without sciatica  Spinal stenosis, lumbar region, with neurogenic claudication     Problem List Patient Active Problem List   Diagnosis Date Noted  . Skin sore 12/01/2017  . Hematoma 10/18/2017  . Accidental fall 10/18/2017  . 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 Radicular pain in right arm 10/09/2016  . GERD (gastroesophageal reflux disease) 07/31/2016  . Trochanteric bursitis 03/03/2016  . Left hand pain 10/30/2015  . Fracture, finger, multiple sites 10/30/2015  . Colon cancer screening 07/23/2015  . Healthcare maintenance 07/23/2015  . Gout 02/18/2015  . Depression 01/06/2015  . Irritation of eyelid 08/20/2014  . Dupuytren's contracture 08/20/2014  . Cough 07/21/2014  . Lumbar stenosis with neurogenic claudication 06/13/2014  . Preop cardiovascular exam 05/28/2014  . S/P deep brain stimulator placement 05/08/2014  . Joint pain 01/22/2014  . Medicare annual wellness visit, initial 01/19/2014  . Advance care planning 01/19/2014  . [place]'s [place] 12/13/2013  . PTSD (post-traumatic stress disorder) 06/13/2013  . Erectile dysfunction 06/13/2013  . HLD (hyperlipidemia) 06/13/2013  . Hip pain 06/10/2013  . Pain in joint, shoulder region 06/10/2013  . Right leg swelling 06/03/2013  . Essential hypertension 06/03/2013  . Bradycardia by electrocardiogram 06/03/2013  . Obstructive sleep apnea 03/12/2013  . Spinal stenosis, lumbar region, with  neurogenic claudication 12/14/2012    HLarae Grooms10/17/2019, 12:16 PM  [place] [facility] [address] [address]Phone: [phone]  Fax:  [phone] Name: George BUCKLESMRN: [id number] of Birth: [birth year]

## 2018-02-01 NOTE — Telephone Encounter (Signed)
Spouse dropped off disk from [employer] In dr duncan's in box

## 2018-02-02 NOTE — Telephone Encounter (Signed)
I will review this when possible.  Thanks.

## 2018-02-05 ENCOUNTER — Ambulatory Visit: Payer: Medicare PPO | Admitting: Psychology

## 2018-02-06 ENCOUNTER — Ambulatory Visit: Payer: Self-pay

## 2018-02-09 ENCOUNTER — Ambulatory Visit: Payer: Medicare PPO

## 2018-02-09 ENCOUNTER — Ambulatory Visit: Payer: Medicare PPO | Admitting: Occupational Therapy

## 2018-02-09 ENCOUNTER — Encounter: Payer: Self-pay | Admitting: Occupational Therapy

## 2018-02-09 DIAGNOSIS — R2689 Other abnormalities of gait and mobility: Secondary | ICD-10-CM | POA: Diagnosis not present

## 2018-02-09 DIAGNOSIS — R279 Unspecified lack of coordination: Secondary | ICD-10-CM | POA: Diagnosis not present

## 2018-02-09 DIAGNOSIS — M6281 Muscle weakness (generalized): Secondary | ICD-10-CM

## 2018-02-09 DIAGNOSIS — R278 Other lack of coordination: Secondary | ICD-10-CM

## 2018-02-09 DIAGNOSIS — R296 Repeated falls: Secondary | ICD-10-CM

## 2018-02-09 DIAGNOSIS — R262 Difficulty in walking, not elsewhere classified: Secondary | ICD-10-CM | POA: Diagnosis not present

## 2018-02-09 DIAGNOSIS — R2681 Unsteadiness on feet: Secondary | ICD-10-CM | POA: Diagnosis not present

## 2018-02-09 DIAGNOSIS — R293 Abnormal posture: Secondary | ICD-10-CM | POA: Diagnosis not present

## 2018-02-09 DIAGNOSIS — G20 Parkinson's disease: Secondary | ICD-10-CM | POA: Diagnosis not present

## 2018-02-09 NOTE — Therapy (Addendum)
[place] MAIN [facility] SERVICES [address] [place], [place], [address] Phone: [phone]   Fax:  [phone]  Occupational Therapy Treatment  Patient Details  Name: [name] MRN: [id number] Date of Birth: 1950/08/30 No data recorded  Encounter Date: 02/09/2018  OT End of Session - 02/09/18 0944    Visit Number  26    Number of Visits  34    Authorization Type  Visit 2 of 10 for progress reporting period starting 01/22/2018    OT Start Time  0930    OT Stop Time  1015    OT Time Calculation (min)  45 min    Activity Tolerance  Patient tolerated treatment well    Behavior During Therapy  [name] for tasks assessed/performed       Past Medical History:  Diagnosis Date  . Arthritis   . Bradycardia   . Cancer [facility]) 2013   skin cancer  . Depression    ptsd  . Dysrhythmia    chronic slow heart rate  . GERD (gastroesophageal reflux disease)   . Headache(784.0)    tension headaches non recent  . History of chicken pox   . History of kidney stones    passed  . Hypertension    treated with HCTZ  . Parkinson's disease ([place])    dx'ed 15 years ago  . PTSD (post-traumatic stress disorder)   . Shortness of breath dyspnea   . Sleep apnea    doesn't use C-pap  . Varicose veins     Past Surgical History:  Procedure Laterality Date  . CHOLECYSTECTOMY N/A 10/22/2014   Procedure: LAPAROSCOPIC CHOLECYSTECTOMY WITH INTRAOPERATIVE CHOLANGIOGRAM;  Surgeon: Dia Crawford III, MD;  Location: [facility] ORS;  Service: General;  Laterality: N/A;  . cyst removed      from lip as a child  . LUMBAR LAMINECTOMY/DECOMPRESSION MICRODISCECTOMY Bilateral 12/14/2012   Procedure: Bilateral lumbar three-four, four-five decompressive laminotomy/foraminotomy;  Surgeon: Charlie Pitter, MD;  Location: [place] NEURO ORS;  Service: Neurosurgery;  Laterality: Bilateral;  . PULSE GENERATOR IMPLANT Bilateral 12/13/2013   Procedure: Bilateral implantable pulse generator placement;   Surgeon: Erline Levine, MD;  Location: [place] NEURO ORS;  Service: Neurosurgery;  Laterality: Bilateral;  Bilateral implantable pulse generator placement  . skin cancer removed     from ears,   12 lft arm  rt leg 15  . SUBTHALAMIC STIMULATOR BATTERY REPLACEMENT Bilateral 07/14/2017   Procedure: BILATERAL IMPLANTED PULSE GENERATOR CHANGE FOR DEEP BRAIN STIMULATOR;  Surgeon: Erline Levine, MD;  Location: [place];  Service: Neurosurgery;  Laterality: Bilateral;  . SUBTHALAMIC STIMULATOR INSERTION Bilateral 12/06/2013   Procedure: SUBTHALAMIC STIMULATOR INSERTION;  Surgeon: Erline Levine, MD;  Location: [place] NEURO ORS;  Service: Neurosurgery;  Laterality: Bilateral;  Bilateral deep brain stimulator placement    There were no vitals filed for this visit.  Subjective Assessment - 02/09/18 0940    Subjective   Pt reports that he had 3 teeth pulled at the dentist yesterday and is currently taking an antibiotic and pain medication.    Patient is accompained by:  Family member    Pertinent History  Pt. is a 67 y.o. mlae who has a history of Parkinson's Disease. Pt. has a Deep Brain Stimulator in place. Pt. has a history of Low Back Pain with 2 back surgeries in August 2014, and February 2016. Pt. has a history of multiple frequent falls occurring at a rate of 6x's a week.     Limitations  Frequent  falls, limited motor control, and [place].     Patient Stated Goals  To return home.    Currently in Pain?  No/denies    Pain Score  0-No pain       OT TREATMENT  Neuromuscular re-education:  Pt. worked on [facility] skills grasping, and manipulating knots using string of varying sizes. Pt completed task to prepare hands for shoe tying and manipulating drawstrings on clothing. Pt demonstrates compensatory patterns at the trunk and shoulders, abducting shoulders and leaning laterally when untying difficult knots. Pt. worked on knots of varying degrees of difficulty. Pt completed shoe tying on personal shoe while removed from foot.  Pt required increased time and x3 trials before successfully tying shoe. On 3rd trail, pt tied shoe in 22 secs.  Self-care/IADLs:   Pt completed typing task focusing on hand placement and keystrokes for j, f, and space bar. Pt required increased time to complete typing and required moderate cuing for hand placement. Pt demonstrated increased accuracy as the task progressed, however typing speed did not improve.                     OT Education - 02/09/18 [phone]    Education provided  Yes    Education Details  [facility] skills, typing skills    Person(s) Educated  Patient    Methods  Verbal cues;Explanation;Demonstration    Comprehension  Verbal cues required;Verbalized understanding;Returned demonstration          OT Long Term Goals - 01/19/18 0945      OT LONG TERM GOAL #1   Title  Pt. will improve Bilateral UE strength by 1 mm grade to assist with ADLs, and IADLs    Baseline  BUE strength: 4+/5 overall    Time  12    Period  Weeks    Status  Achieved      OT LONG TERM GOAL #2   Title  Pt. will improve grip strength to be able to pour himself a beverage.    Baseline  Pt. continues to be unable to hold and pour a bevrage.    Time  12    Period  Weeks    Status  Achieved    Target Date  --      OT LONG TERM GOAL #3   Title  Pt. will improve lateral pinch stength to be able to independently turn a key.     Baseline  Pt. continues to have difficulty holding, and turning  key.    Time  12    Period  Weeks    Status  Achieved      OT LONG TERM GOAL #4   Title  Pt. will improve Right hand [facility] skills to be able to independently grasp and use small objects needed for fixing car parts.    Baseline   45sec. on the 9 hole peg test. 01/19/2018 56 sec. on 9 hole peg test.    Time  12    Period  Weeks    Status  On-going    Target Date  04/13/18      OT LONG TERM GOAL #5   Title  Pt. will be demonstrate self-feeding with modified independence with minimal spillage.     Baseline  Pt. continues to require assistance, and has alot of spillage.    Period  Weeks    Status  On-going    Target Date  04/13/18      Long Term Additional Goals  Additional Long Term Goals  Yes      OT LONG TERM GOAL #6   Title  Pt. will manipulate buttons, and cuff buttons independently.    Baseline   Pt. has difficulty manipulating buttons    Time  12    Period  Weeks    Status  On-going    Target Date  04/13/18      OT LONG TERM GOAL #7   Title  Pt. will write 1 sentence efficiently with 90% legibility.    Baseline  name only 75% legibility    Time  12    Period  Weeks    Status  On-going    Target Date  04/13/18      OT LONG TERM GOAL #8   Title  Pt. will improve typing speed, and accuracy to be able to to type emails efficently    Baseline  Typing speed: 6 wpm, Accuracy: 33% with moderate cues for hand placement    Time  12    Period  Weeks    Status  On-going    Target Date  04/13/18      OT LONG TERM GOAL  #9   Baseline  Pt will transfers in and out of the shower with stand-by assistance    Time  12    Period  Weeks    Status  New    Target Date  04/13/18      OT LONG TERM GOAL  #10   TITLE  Pt will don and doff pants with min A, PRN    Baseline  Pt requires mod A to don and doff pants    Time  12    Period  Weeks    Status  New    Target Date  04/13/18      OT LONG TERM GOAL  #11   TITLE  Pt will don and doff socks modified independently using AE, PRN    Baseline  Pt has increased difficulty donning and doffing socks    Time  6    Period  Weeks    Status  New    Target Date  03/02/18      OT LONG TERM GOAL  #12   TITLE  Pt will independently tie shoes    Baseline  Pt requires assistance to tie shoes    Time  6    Period  Weeks    Status  New    Target Date  03/02/18            Plan - 02/09/18 0949    Clinical Impression Statement  Pt presents as disoriented and lethargic, potentially due to medications after having dental work done  yesterday. Pt continues to work on improving [facility] skills in order to be able to increase independence and efficiency during ADLs, specifically for tying his shoe laces. Pt presents with decreased [facility] skills and motor control when manipulating laces of various sizes and textures. Pt is able to sequence steps correctly, however requires increased time and minimal assistance to tie laces. Pt demonstrates decreased coordination and motor control when attempting to tie shoe while wearing it. Pt continues to work to increase typing speed and accuracy. Pt demonstrates decreased motor control when typing which decreases his typing speed. Improved FMC and motor control supports pt's independence during ADLs and IADLs.    Occupational performance deficits (Please refer to evaluation for details):  ADL's;IADL's    Rehab Potential  Good  Current Impairments/barriers affecting progress:  Positive Indicators: age, familiy support, motivation. Negative Indicators: Multiple comorbidities, and history of falls.    OT Frequency  2x / week    OT Duration  12 weeks    OT Treatment/Interventions  Self-care/ADL training;Therapeutic exercise;DME and/or AE instruction;Energy conservation;Neuromuscular education;Patient/family education;Therapeutic activities    Clinical Decision Making  Several treatment options, min-mod task modification necessary    OT Home Exercise Plan  HEP for using rubber bands for strengthening at home and work on more handwriting.    Consulted and Agree with Plan of Care  Patient       Patient will benefit from skilled therapeutic intervention in order to improve the following deficits and impairments:  Pain, Decreased strength, Decreased coordination, Decreased balance, Impaired UE functional use, Decreased safety awareness, Decreased activity tolerance, Decreased cognition, Impaired flexibility, Impaired vision/preception  Visit Diagnosis: Other lack of coordination    Problem List Patient  Active Problem List   Diagnosis Date Noted  . Skin sore 12/01/2017  . Hematoma 10/18/2017  . Accidental fall 10/18/2017  . 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 Radicular pain in right arm 10/09/2016  . GERD (gastroesophageal reflux disease) 07/31/2016  . Trochanteric bursitis 03/03/2016  . Left hand pain 10/30/2015  . Fracture, finger, multiple sites 10/30/2015  . Colon cancer screening 07/23/2015  . Healthcare maintenance 07/23/2015  . Gout 02/18/2015  . Depression 01/06/2015  . Irritation of eyelid 08/20/2014  . Dupuytren's contracture 08/20/2014  . Cough 07/21/2014  . Lumbar stenosis with neurogenic claudication 06/13/2014  . Preop cardiovascular exam 05/28/2014  . S/P deep brain stimulator placement 05/08/2014  . Joint pain 01/22/2014  . Medicare annual wellness visit, initial 01/19/2014  . Advance care planning 01/19/2014  . Parkinson's disease ([place]) 12/13/2013  . PTSD (post-traumatic stress disorder) 06/13/2013  . Erectile dysfunction 06/13/2013  . HLD (hyperlipidemia) 06/13/2013  . Hip pain 06/10/2013  . Pain in joint, shoulder region 06/10/2013  . Right leg swelling 06/03/2013  . Essential hypertension 06/03/2013  . Bradycardia by electrocardiogram 06/03/2013  . Obstructive sleep apnea 03/12/2013  . Spinal stenosis, lumbar region, with neurogenic claudication 12/14/2012  This entire session was performed under direct supervision and direction of a licensed therapist/therapist assistant . I have personally read, edited and approve of the note as written.  [name] Oneita Jolly, OTR/L, CLT Oliver Hum, OTS 02/09/2018, 11:08 AM  [place] MAIN [facility] SERVICES [address] [place], [place], [address] Phone: [phone]   Fax:  [phone]  Name: DADE RODIN MRN: [id number] Date of Birth: 07-May-1950

## 2018-02-09 NOTE — Therapy (Signed)
[place] MAIN [facility] SERVICES [address] [place], [place], [address] Phone: [phone]   Fax:  [phone]  Physical Therapy Treatment  Patient Details  Name: [name] MRN: [id number] Date of Birth: [birth year] Referring Provider (PT): Alonza Bogus   Encounter Date: 02/09/2018  PT End of Session - 02/09/18 0955    Visit Number  61    Number of Visits  73    Date for PT Re-Evaluation  03/08/18    Authorization Type  5/10    PT Start Time  1016    PT Stop Time  1100    PT Time Calculation (min)  44 min    Activity Tolerance  Patient tolerated treatment well;Other (comment)    Behavior During Therapy  WFL for tasks assessed/performed       Past Medical History:  Diagnosis Date  . Arthritis   . Bradycardia   . Cancer [facility]) 2013   skin cancer  . Depression    ptsd  . Dysrhythmia    chronic slow heart rate  . GERD (gastroesophageal reflux disease)   . Headache(784.0)    tension headaches non recent  . History of chicken pox   . History of kidney stones    passed  . Hypertension    treated with HCTZ  . Parkinson's disease ([place])    dx'ed 15 years ago  . PTSD (post-traumatic stress disorder)   . Shortness of breath dyspnea   . Sleep apnea    doesn't use C-pap  . Varicose veins     Past Surgical History:  Procedure Laterality Date  . CHOLECYSTECTOMY N/A 10/22/2014   Procedure: LAPAROSCOPIC CHOLECYSTECTOMY WITH INTRAOPERATIVE CHOLANGIOGRAM;  Surgeon: Dia Crawford III, MD;  Location: [facility] ORS;  Service: General;  Laterality: N/A;  . cyst removed      from lip as a child  . LUMBAR LAMINECTOMY/DECOMPRESSION MICRODISCECTOMY Bilateral 12/14/2012   Procedure: Bilateral lumbar three-four, four-five decompressive laminotomy/foraminotomy;  Surgeon: Charlie Pitter, MD;  Location: [place] NEURO ORS;  Service: Neurosurgery;  Laterality: Bilateral;  . PULSE GENERATOR IMPLANT Bilateral 12/13/2013   Procedure: Bilateral implantable pulse generator  placement;  Surgeon: Erline Levine, MD;  Location: [place] NEURO ORS;  Service: Neurosurgery;  Laterality: Bilateral;  Bilateral implantable pulse generator placement  . skin cancer removed     from ears,   12 lft arm  rt leg 15  . SUBTHALAMIC STIMULATOR BATTERY REPLACEMENT Bilateral 07/14/2017   Procedure: BILATERAL IMPLANTED PULSE GENERATOR CHANGE FOR DEEP BRAIN STIMULATOR;  Surgeon: Erline Levine, MD;  Location: [place];  Service: Neurosurgery;  Laterality: Bilateral;  . SUBTHALAMIC STIMULATOR INSERTION Bilateral 12/06/2013   Procedure: SUBTHALAMIC STIMULATOR INSERTION;  Surgeon: Erline Levine, MD;  Location: [place] NEURO ORS;  Service: Neurosurgery;  Laterality: Bilateral;  Bilateral deep brain stimulator placement    There were no vitals filed for this visit.  Subjective Assessment - 02/09/18 0954    Subjective  Patient has had some teeth removed and is on antibiotics and pain medication at this time. Is on a liquid/puree diet until healed. Has not been doing his HEP. Has not had an aide this week. Is very confused due to being on medication from surgery.     Patient is accompained by:  Family member    Pertinent History  Patient referred for physical therapy due to wife's request for patient to get aquatic therapy to help with his conditioning. Pt is also complaining of chronic low back and R hip pain and  frequent falls. 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 The last time he was in the water he was very uncomfortable because he didn't feel like he had any control. Pt has a history of 2 low back surgeries in August 2014 and February 2016. Pt and wife are unable to recall exactly what was performed but they know that he had stenosis. Surgeries were performed by Dr. Annette Stable in [place]. Pt reports that his low back and R hip pain are constant. He describes the pain in the back as "dull" and the pain in the hip as "sharp." For both back and hip worst pain  is 10/10, Best: 2/10, Present: 2/10. No particular episode of trauma to his low back or hip preceding the pain. Pt has a history of Parkinson's with a deep brain stimulator. He ambulates with a 4 wheeled walker called a U-step (automatic brakes, audio cues for stepping). He states that he falls frequently and reports that he has had approximately 40-50 falls in the last 6 months. No specific recent changes in his health.     Currently in Pain?  No/denies        Vitals: 139/77 pulse 42  Has not been doing his HEP. Has not had an aide this week. Is very confused due to being on medication from surgery.   MODI BERG Falls: patient reports at least 3 falls this week. The last two days he has been on pain medication so has been forgetful.  Strength : 4+/5 hip flexor, 4/5 knee, 3+/5 gluteals   Treat: 3lb ankle weights             Seated marches 15x each leg to PT hand; second trial kicking soccer ball              Seated LAQ 15x each leg to PT hand; second trial kicking ball   RTB hamstring curls 15x each leg; 2 sets   Weighted ball (2000g) seated without back support ball toss to encourage postural musculature and upright seated strengthening.    Balloon taps seated reaching inside and outside BOS while returning to COM, requires occasional cueing /min A to return to COM. x 3 trials                        PT Education - 02/09/18 0955    Education provided  Yes    Education Details  compliance with staying in w/c. seated exercise compliance need, goals     Person(s) Educated  Patient    Methods  Explanation;Demonstration;Verbal cues    Comprehension  Verbalized understanding;Returned demonstration       PT Short Term Goals - 01/11/18 1334      PT SHORT TERM GOAL #1   Title  Pt will perform HEP with family's supervision, for improved balance, transfers, and gait.      Baseline  patient reports compliance, however not every day; was unable to preform at the away  home    Time  4    Period  Weeks    Status  Partially Met      PT SHORT TERM GOAL #2   Title  Pt will perform 5x sit<>stand in less than or equal to 15 seconds for improved efficiency and safety with transfers.    Baseline  19 seconds ; 15 seconds    Time  2    Period  Weeks  Status  Achieved        PT Long Term Goals - 02/09/18 1123      PT LONG TERM GOAL #1   Title  Pt will decrease 5TSTS by at least 3 seconds in order to demonstrate clinically significant improvement in LE strength     Baseline  02/08/17: 20.1 seconds 12/18: 17 seconds ; 09/14/17 =24.34 sec with assist due to poor static standing balance    Time  8    Period  Weeks    Status  Achieved      PT LONG TERM GOAL #2   Title  Pt will decrease TUG by at least 3.4s in order to demonstrate decreased fall risk     Baseline  02/08/17: 22.1s 12/18: 20 seconds 2/7: 19 seconds; 4/3: 23 seconds with walker, 09/14/17 15.12 sec 7/23: 15 seconds RW 8/22: no longer ambulatory    Time  8    Period  Weeks    Status  Deferred      PT LONG TERM GOAL #3   Title  Pt will improve BERG by at least 3 points in order to demonstrate clinically significant improvement in balance.    Baseline  02/08/17: 30/56 12/18: 38/56      Time  8    Period  Weeks    Status  Achieved      PT LONG TERM GOAL #4   Title  Pt/wife will verbalize understanding of plans for continued community fitness upon D/C from PT including aquatic therapy program    Baseline  02/08/17: pt currently reports being more comfortable in the water    Time  8    Period  Weeks    Status  Partially Met      PT LONG TERM GOAL #5   Title  Pt will decrease mODI scoreby at least 13 points in order demonstrate clinically significant reduction in pain/disability     Baseline  02/08/17: 54% 12/18: 46% 2/7: 42% 4/3; 50%, 09/14/17 42% 7/23: 42% 8/22 8/22:  31/56 9/26: 60% 10/25: 60%-patient confused    Time  8    Period  Weeks    Status  Partially Met    Target Date  03/08/18       PT LONG TERM GOAL #6   Title  Pt will improve BERG to 44/56  in order to demonstrate clinically significant improvement in balance for decreased fall risk    Baseline  12/18: 38/56 2/7: 41/56 4/3: 41/56, 09/14/17  19/56 7/23: 44/56 9/26: 20/56 10/25: unable to follow commands due to  medication     Time  8    Period  Weeks    Status  On-going    Target Date  03/08/18      PT LONG TERM GOAL #7   Title  Patient will reduce number of falls per week to <3 to reduce chance of injury and decrease frequent LOB.     Baseline  8/22: 6x/week 9/26: 2x/ day 10/25: had 4 falls this week     Time  8    Period  Weeks    Status  Partially Met      PT LONG TERM GOAL #8   Title  Patient will increase functional strength to 4+/5 to improve functional mobility and safety with transfers.     Baseline  8/22: 4-/5 gross 9/26: 3+/5 10/25; 4/5    Time  8    Period  Weeks    Status  Partially Met  Plan - 02/09/18 1122    Clinical Impression Statement  Patient is very fatigued/ limited orientaiton due to medication today limiting his ability to perform goals and follow tasks. Unable to perform BERG due to patient limited task following. His impulsiveness during transfers/ attempted walking results frequently in falls. Patient and wife aware that progress towards goals must occur by next land visit or patient will be discharged due to plateau. Patient will benefit from continued skilled land and aquatic based physical therapy to improve strength, posture, and decrease fall risk.     Rehab Potential  Fair    PT Frequency  2x / week    PT Duration  8 weeks    PT Treatment/Interventions  ADLs/Self Care Home Management;Aquatic Therapy;Cryotherapy;Electrical Stimulation;Iontophoresis '4mg'$ /ml Dexamethasone;Moist Heat;Ultrasound;Contrast Bath;DME Instruction;Gait training;Stair training;Functional mobility training;Therapeutic activities;Therapeutic exercise;Balance training;Neuromuscular  re-education;Patient/family education;Manual techniques;Wheelchair mobility training;Energy conservation;Passive range of motion    PT Next Visit Plan  progress balance, strength, aquatic therapy    PT Home Exercise Plan  seated strengthening    Consulted and Agree with Plan of Care  Patient    Family Member Consulted  wife       Patient will benefit from skilled therapeutic intervention in order to improve the following deficits and impairments:  Abnormal gait, Decreased balance, Decreased coordination, Decreased mobility, Impaired tone, Postural dysfunction, Decreased strength, Decreased safety awareness, Improper body mechanics, Impaired flexibility  Visit Diagnosis: Other lack of coordination  Muscle weakness (generalized)  Repeated falls  Other abnormalities of gait and mobility     Problem List Patient Active Problem List   Diagnosis Date Noted  . Skin sore 12/01/2017  . Hematoma 10/18/2017  . Accidental fall 10/18/2017  . 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 Radicular pain in right arm 10/09/2016  . GERD (gastroesophageal reflux disease) 07/31/2016  . Trochanteric bursitis 03/03/2016  . Left hand pain 10/30/2015  . Fracture, finger, multiple sites 10/30/2015  . Colon cancer screening 07/23/2015  . Healthcare maintenance 07/23/2015  . Gout 02/18/2015  . Depression 01/06/2015  . Irritation of eyelid 08/20/2014  . Dupuytren's contracture 08/20/2014  . Cough 07/21/2014  . Lumbar stenosis with neurogenic claudication 06/13/2014  . Preop cardiovascular exam 05/28/2014  . S/P deep brain stimulator placement 05/08/2014  . Joint pain 01/22/2014  . Medicare annual wellness visit, initial 01/19/2014  . Advance care planning 01/19/2014  . Parkinson's disease ([place]) 12/13/2013  . PTSD (post-traumatic stress disorder) 06/13/2013  . Erectile dysfunction 06/13/2013  . HLD (hyperlipidemia) 06/13/2013  . Hip pain 06/10/2013  . Pain in joint, shoulder region 06/10/2013  .  Right leg swelling 06/03/2013  . Essential hypertension 06/03/2013  . Bradycardia by electrocardiogram 06/03/2013  . Obstructive sleep apnea 03/12/2013  . Spinal stenosis, lumbar region, with neurogenic claudication 12/14/2012   Janna Arch, PT, DPT   02/09/2018, 11:26 AM  [place] MAIN [facility] SERVICES [place], [place], [address] Phone: [phone]   Fax:  [phone]  Name: DHIREN AZIMI MRN: [id number] Date of Birth: 1950/05/26

## 2018-02-13 ENCOUNTER — Ambulatory Visit: Payer: Self-pay

## 2018-02-14 ENCOUNTER — Ambulatory Visit: Payer: Self-pay | Admitting: Physical Therapy

## 2018-02-14 ENCOUNTER — Encounter: Payer: Self-pay | Admitting: Speech Pathology

## 2018-02-15 ENCOUNTER — Other Ambulatory Visit: Payer: Self-pay

## 2018-02-15 ENCOUNTER — Ambulatory Visit: Payer: Medicare PPO

## 2018-02-15 DIAGNOSIS — R262 Difficulty in walking, not elsewhere classified: Secondary | ICD-10-CM | POA: Diagnosis not present

## 2018-02-15 DIAGNOSIS — R278 Other lack of coordination: Secondary | ICD-10-CM | POA: Diagnosis not present

## 2018-02-15 DIAGNOSIS — G20 Parkinson's disease: Secondary | ICD-10-CM

## 2018-02-15 DIAGNOSIS — R29898 Other symptoms and signs involving the musculoskeletal system: Secondary | ICD-10-CM

## 2018-02-15 DIAGNOSIS — R279 Unspecified lack of coordination: Secondary | ICD-10-CM

## 2018-02-15 DIAGNOSIS — R293 Abnormal posture: Secondary | ICD-10-CM | POA: Diagnosis not present

## 2018-02-15 DIAGNOSIS — R2689 Other abnormalities of gait and mobility: Secondary | ICD-10-CM | POA: Diagnosis not present

## 2018-02-15 DIAGNOSIS — R29818 Other symptoms and signs involving the nervous system: Secondary | ICD-10-CM

## 2018-02-15 DIAGNOSIS — M6281 Muscle weakness (generalized): Secondary | ICD-10-CM

## 2018-02-15 DIAGNOSIS — R296 Repeated falls: Secondary | ICD-10-CM

## 2018-02-15 DIAGNOSIS — R2681 Unsteadiness on feet: Secondary | ICD-10-CM

## 2018-02-15 NOTE — Therapy (Signed)
[place] MAIN [facility] SERVICES [address] [name], [place], [address] Phone: [phone]   Fax:  [phone]  Physical Therapy Treatment  Patient Details  Name: [name] MRN: [id number] Date of Birth: 04-25-1950 Referring Provider (PT): Alonza Bogus   Encounter Date: 02/15/2018  PT End of Session - 02/15/18 1310    Visit Number  62    Number of Visits  73    Date for PT Re-Evaluation  03/08/18    Authorization Type  6/10    PT Start Time  0930    PT Stop Time  1015    PT Time Calculation (min)  45 min    Activity Tolerance  Patient tolerated treatment well;Other (comment)    Behavior During Therapy  WFL for tasks assessed/performed       Past Medical History:  Diagnosis Date  . Arthritis   . Bradycardia   . Cancer [facility]) 2013   skin cancer  . Depression    ptsd  . Dysrhythmia    chronic slow heart rate  . GERD (gastroesophageal reflux disease)   . Headache(784.0)    tension headaches non recent  . History of chicken pox   . History of kidney stones    passed  . Hypertension    treated with HCTZ  . Parkinson's disease ([address])    dx'ed 15 years ago  . PTSD (post-traumatic stress disorder)   . Shortness of breath dyspnea   . Sleep apnea    doesn't use C-pap  . Varicose veins     Past Surgical History:  Procedure Laterality Date  . CHOLECYSTECTOMY N/A 10/22/2014   Procedure: LAPAROSCOPIC CHOLECYSTECTOMY WITH INTRAOPERATIVE CHOLANGIOGRAM;  Surgeon: Dia Crawford III, MD;  Location: [facility] ORS;  Service: General;  Laterality: N/A;  . cyst removed      from lip as a child  . LUMBAR LAMINECTOMY/DECOMPRESSION MICRODISCECTOMY Bilateral 12/14/2012   Procedure: Bilateral lumbar three-four, four-five decompressive laminotomy/foraminotomy;  Surgeon: Charlie Pitter, MD;  Location: [place] NEURO ORS;  Service: Neurosurgery;  Laterality: Bilateral;  . PULSE GENERATOR IMPLANT Bilateral 12/13/2013   Procedure: Bilateral implantable pulse generator  placement;  Surgeon: Erline Levine, MD;  Location: [place] NEURO ORS;  Service: Neurosurgery;  Laterality: Bilateral;  Bilateral implantable pulse generator placement  . skin cancer removed     from ears,   12 lft arm  rt leg 15  . SUBTHALAMIC STIMULATOR BATTERY REPLACEMENT Bilateral 07/14/2017   Procedure: BILATERAL IMPLANTED PULSE GENERATOR CHANGE FOR DEEP BRAIN STIMULATOR;  Surgeon: Erline Levine, MD;  Location: [place];  Service: Neurosurgery;  Laterality: Bilateral;  . SUBTHALAMIC STIMULATOR INSERTION Bilateral 12/06/2013   Procedure: SUBTHALAMIC STIMULATOR INSERTION;  Surgeon: Erline Levine, MD;  Location: [place] NEURO ORS;  Service: Neurosurgery;  Laterality: Bilateral;  Bilateral deep brain stimulator placement    There were no vitals filed for this visit.  Subjective Assessment - 02/15/18 1307    Subjective  Pt/spouse report 3 falls this past week (which is reported improvement as a decrease from everyday). Spouse reports all falls happened when pt getting up by himself without help or SBA. No injuries to report    Patient is accompained by:  Family member    Pertinent History  Patient referred for physical therapy due to wife's request for patient to get aquatic therapy to help with his conditioning. Pt is also complaining of chronic low back and R hip pain and frequent falls. 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 The last time he was in the water he was very uncomfortable because he didn't feel like he had any control. Pt has a history of 2 low back surgeries in August 2014 and February 2016. Pt and wife are unable to recall exactly what was performed but they know that he had stenosis. Surgeries were performed by Dr. Annette Stable in [place]. Pt reports that his low back and R hip pain are constant. He describes the pain in the back as "dull" and the pain in the hip as "sharp." For both back and hip worst pain is 10/10, Best: 2/10, Present: 2/10. No  particular episode of trauma to his low back or hip preceding the pain. Pt has a history of Parkinson's with a deep brain stimulator. He ambulates with a 4 wheeled walker called a U-step (automatic brakes, audio cues for stepping). He states that he falls frequently and reports that he has had approximately 40-50 falls in the last 6 months. No specific recent changes in his health.       Assisted enter/exit via ramp (ambulating/sidestep) 3# ankle weights throughout session for grounding  Ambulation, blue dumbbells with assist as needed  Fwd 4 L  Side 2 L   Side with squat 2 L  Bkwd 2 L  High knee 2 L  Bench  Plank walk; ipsilateral UE/LE moving, 2 L  [place], contralateral UE/LE lift, 20x each alternating  STS L (RLE extended supported on therapist leg), 20x (encourage full stand and slow return)  TUG bench to rope (approx 14 ft) 6x ranging 23 to 17 seconds (improvement)  Seated on bench bike with core engaged, 5 min                         PT Education - 02/15/18 1309    Education provided  Yes    Education Details  Safety importance. Coordination activities in session and ideas for home use    Person(s) Educated  Patient;Spouse    Methods  Explanation;Demonstration;Verbal cues    Comprehension  Returned demonstration;Verbal cues required       PT Short Term Goals - 01/11/18 1334      PT SHORT TERM GOAL #1   Title  Pt will perform HEP with family's supervision, for improved balance, transfers, and gait.      Baseline  patient reports compliance, however not every day; was unable to preform at the away home    Time  4    Period  Weeks    Status  Partially Met      PT SHORT TERM GOAL #2   Title  Pt will perform 5x sit<>stand in less than or equal to 15 seconds for improved efficiency and safety with transfers.    Baseline  19 seconds ; 15 seconds    Time  2    Period  Weeks    Status  Achieved        PT Long Term Goals - 02/09/18 1123       PT LONG TERM GOAL #1   Title  Pt will decrease 5TSTS by at least 3 seconds in order to demonstrate clinically significant improvement in LE strength     Baseline  02/08/17: 20.1 seconds 12/18: 17 seconds ; 09/14/17 =24.34 sec with assist due to poor static standing balance    Time  8    Period  Weeks    Status  Achieved  PT LONG TERM GOAL #2   Title  Pt will decrease TUG by at least 3.4s in order to demonstrate decreased fall risk     Baseline  02/08/17: 22.1s 12/18: 20 seconds 2/7: 19 seconds; 4/3: 23 seconds with walker, 09/14/17 15.12 sec 7/23: 15 seconds RW 8/22: no longer ambulatory    Time  8    Period  Weeks    Status  Deferred      PT LONG TERM GOAL #3   Title  Pt will improve BERG by at least 3 points in order to demonstrate clinically significant improvement in balance.    Baseline  02/08/17: 30/56 12/18: 38/56      Time  8    Period  Weeks    Status  Achieved      PT LONG TERM GOAL #4   Title  Pt/wife will verbalize understanding of plans for continued community fitness upon D/C from PT including aquatic therapy program    Baseline  02/08/17: pt currently reports being more comfortable in the water    Time  8    Period  Weeks    Status  Partially Met      PT LONG TERM GOAL #5   Title  Pt will decrease mODI scoreby at least 13 points in order demonstrate clinically significant reduction in pain/disability     Baseline  02/08/17: 54% 12/18: 46% 2/7: 42% 4/3; 50%, 09/14/17 42% 7/23: 42% 8/22 8/22:  31/56 9/26: 60% 10/25: 60%-patient confused    Time  8    Period  Weeks    Status  Partially Met    Target Date  03/08/18      PT LONG TERM GOAL #6   Title  Pt will improve BERG to 44/56  in order to demonstrate clinically significant improvement in balance for decreased fall risk    Baseline  12/18: 38/56 2/7: 41/56 4/3: 41/56, 09/14/17  19/56 7/23: 44/56 9/26: 20/56 10/25: unable to follow commands due to  medication     Time  8    Period  Weeks    Status  On-going     Target Date  03/08/18      PT LONG TERM GOAL #7   Title  Patient will reduce number of falls per week to <3 to reduce chance of injury and decrease frequent LOB.     Baseline  8/22: 6x/week 9/26: 2x/ day 10/25: had 4 falls this week     Time  8    Period  Weeks    Status  Partially Met      PT LONG TERM GOAL #8   Title  Patient will increase functional strength to 4+/5 to improve functional mobility and safety with transfers.     Baseline  8/22: 4-/5 gross 9/26: 3+/5 10/25; 4/5    Time  8    Period  Weeks    Status  Partially Met            Plan - 02/15/18 1311    Clinical Impression Statement  Pt demonstrated good understanding of coordinated activity/exercises in pool using ipsilateral and contralateral sides together with intermittent cues. Conversation will distract and throw rhythm off. Encouraged activity and given ideas for home to exercise brain/body response. Several large LOB posterior today with Max A and cues and increased time to regain balance. Otherwise, demonstrated good effort, decreased time with TUG activity and improved use of LLE strength and balance with L STS. Continue to progress strength,  balance and coordination throughout body.     Rehab Potential  Fair    PT Frequency  2x / week    PT Duration  8 weeks    PT Treatment/Interventions  ADLs/Self Care Home Management;Aquatic Therapy;Cryotherapy;Electrical Stimulation;Iontophoresis 81m/ml Dexamethasone;Moist Heat;Ultrasound;Contrast Bath;DME Instruction;Gait training;Stair training;Functional mobility training;Therapeutic activities;Therapeutic exercise;Balance training;Neuromuscular re-education;Patient/family education;Manual techniques;Wheelchair mobility training;Energy conservation;Passive range of motion    PT Next Visit Plan  progress balance, strength, aquatic therapy    PT Home Exercise Plan  seated strengthening    Consulted and Agree with Plan of Care  Patient    Family Member Consulted  wife        Patient will benefit from skilled therapeutic intervention in order to improve the following deficits and impairments:  Abnormal gait, Decreased balance, Decreased coordination, Decreased mobility, Impaired tone, Postural dysfunction, Decreased strength, Decreased safety awareness, Improper body mechanics, Impaired flexibility  Visit Diagnosis: Other lack of coordination  Muscle weakness (generalized)  Repeated falls  Other abnormalities of gait and mobility  Unsteadiness on feet  Unspecified lack of coordination  Abnormal posture  Difficulty walking  Parkinson's disease ([facility])  Other symptoms and signs involving the musculoskeletal system  Other symptoms and signs involving the nervous system     Problem List Patient Active Problem List   Diagnosis Date Noted  . Skin sore 12/01/2017  . Hematoma 10/18/2017  . Accidental fall 10/18/2017  . 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 Radicular pain in right arm 10/09/2016  . GERD (gastroesophageal reflux disease) 07/31/2016  . Trochanteric bursitis 03/03/2016  . Left hand pain 10/30/2015  . Fracture, finger, multiple sites 10/30/2015  . Colon cancer screening 07/23/2015  . Healthcare maintenance 07/23/2015  . Gout 02/18/2015  . Depression 01/06/2015  . Irritation of eyelid 08/20/2014  . Dupuytren's contracture 08/20/2014  . Cough 07/21/2014  . Lumbar stenosis with neurogenic claudication 06/13/2014  . Preop cardiovascular exam 05/28/2014  . S/P deep brain stimulator placement 05/08/2014  . Joint pain 01/22/2014  . Medicare annual wellness visit, initial 01/19/2014  . Advance care planning 01/19/2014  . [place] 12/13/2013  . PTSD (post-traumatic stress disorder) 06/13/2013  . Erectile dysfunction 06/13/2013  . HLD (hyperlipidemia) 06/13/2013  . Hip pain 06/10/2013  . Pain in joint, shoulder region 06/10/2013  . Right leg swelling 06/03/2013  . Essential hypertension 06/03/2013  . Bradycardia  by electrocardiogram 06/03/2013  . Obstructive sleep apnea 03/12/2013  . Spinal stenosis, lumbar region, with neurogenic claudication 12/14/2012    HLarae Grooms10/31/2019, 1:16 PM  [place] [facility] [address] [address]Phone: [phone]  Fax:  [phone] Name: TTHOAMS SIEFERTMRN: [id number] of Birth: [birth year]

## 2018-02-16 ENCOUNTER — Ambulatory Visit: Payer: Medicare PPO | Attending: Neurology | Admitting: Occupational Therapy

## 2018-02-16 ENCOUNTER — Encounter: Payer: Self-pay | Admitting: Occupational Therapy

## 2018-02-16 DIAGNOSIS — R2681 Unsteadiness on feet: Secondary | ICD-10-CM | POA: Insufficient documentation

## 2018-02-16 DIAGNOSIS — R293 Abnormal posture: Secondary | ICD-10-CM | POA: Diagnosis not present

## 2018-02-16 DIAGNOSIS — R2689 Other abnormalities of gait and mobility: Secondary | ICD-10-CM | POA: Diagnosis not present

## 2018-02-16 DIAGNOSIS — R296 Repeated falls: Secondary | ICD-10-CM | POA: Diagnosis not present

## 2018-02-16 DIAGNOSIS — R278 Other lack of coordination: Secondary | ICD-10-CM | POA: Diagnosis not present

## 2018-02-16 DIAGNOSIS — R29818 Other symptoms and signs involving the nervous system: Secondary | ICD-10-CM | POA: Diagnosis present

## 2018-02-16 DIAGNOSIS — M6281 Muscle weakness (generalized): Secondary | ICD-10-CM

## 2018-02-16 DIAGNOSIS — R279 Unspecified lack of coordination: Secondary | ICD-10-CM | POA: Diagnosis not present

## 2018-02-16 DIAGNOSIS — G20 Parkinson's disease: Secondary | ICD-10-CM | POA: Insufficient documentation

## 2018-02-16 DIAGNOSIS — R262 Difficulty in walking, not elsewhere classified: Secondary | ICD-10-CM | POA: Insufficient documentation

## 2018-02-16 DIAGNOSIS — R29898 Other symptoms and signs involving the musculoskeletal system: Secondary | ICD-10-CM | POA: Diagnosis present

## 2018-02-16 NOTE — Therapy (Signed)
[place] MAIN [facility] SERVICES [address] [place], [place], [address] Phone: [phone]   Fax:  [phone]  Occupational Therapy Treatment  Patient Details  Name: [name] MRN: [id number] Date of Birth: 12/16/1950 No data recorded  Encounter Date: 02/16/2018  OT End of Session - 02/21/18 1942    Visit Number  27    Number of Visits  48    Date for OT Re-Evaluation  04/13/18    Authorization Type  Visit 3 of 10 for progress reporting period starting 01/22/2018    OT Start Time  0929    OT Stop Time  1015    OT Time Calculation (min)  46 min    Activity Tolerance  Patient tolerated treatment well    Behavior During Therapy  [facility] for tasks assessed/performed       Past Medical History:  Diagnosis Date  . Arthritis   . Bradycardia   . Cancer [facility]) 2013   skin cancer  . Depression    ptsd  . Dysrhythmia    chronic slow heart rate  . GERD (gastroesophageal reflux disease)   . Headache(784.0)    tension headaches non recent  . History of chicken pox   . History of kidney stones    passed  . Hypertension    treated with HCTZ  . Parkinson's disease ([place])    dx'ed 15 years ago  . PTSD (post-traumatic stress disorder)   . Shortness of breath dyspnea   . Sleep apnea    doesn't use C-pap  . Varicose veins     Past Surgical History:  Procedure Laterality Date  . CHOLECYSTECTOMY N/A 10/22/2014   Procedure: LAPAROSCOPIC CHOLECYSTECTOMY WITH INTRAOPERATIVE CHOLANGIOGRAM;  Surgeon: Dia Crawford III, MD;  Location: [facility] ORS;  Service: General;  Laterality: N/A;  . cyst removed      from lip as a child  . LUMBAR LAMINECTOMY/DECOMPRESSION MICRODISCECTOMY Bilateral 12/14/2012   Procedure: Bilateral lumbar three-four, four-five decompressive laminotomy/foraminotomy;  Surgeon: Charlie Pitter, MD;  Location: [place] NEURO ORS;  Service: Neurosurgery;  Laterality: Bilateral;  . PULSE GENERATOR IMPLANT Bilateral 12/13/2013   Procedure: Bilateral  implantable pulse generator placement;  Surgeon: Erline Levine, MD;  Location: [place] NEURO ORS;  Service: Neurosurgery;  Laterality: Bilateral;  Bilateral implantable pulse generator placement  . skin cancer removed     from ears,   12 lft arm  rt leg 15  . SUBTHALAMIC STIMULATOR BATTERY REPLACEMENT Bilateral 07/14/2017   Procedure: BILATERAL IMPLANTED PULSE GENERATOR CHANGE FOR DEEP BRAIN STIMULATOR;  Surgeon: Erline Levine, MD;  Location: [place];  Service: Neurosurgery;  Laterality: Bilateral;  . SUBTHALAMIC STIMULATOR INSERTION Bilateral 12/06/2013   Procedure: SUBTHALAMIC STIMULATOR INSERTION;  Surgeon: Erline Levine, MD;  Location: [place] NEURO ORS;  Service: Neurosurgery;  Laterality: Bilateral;  Bilateral deep brain stimulator placement    There were no vitals filed for this visit.  Subjective Assessment - 02/21/18 1942    Subjective   Patient reports this has been a long week!    Pertinent History  Pt. is a 67 y.o. mlae who has a history of Parkinson's Disease. Pt. has a Deep Brain Stimulator in place. Pt. has a history of Low Back Pain with 2 back surgeries in August 2014, and February 2016. Pt. has a history of multiple frequent falls occurring at a rate of 6x's a week.     Patient Stated Goals  To return home.    Currently in Pain?  No/denies  Pain Score  0-No pain    Pain Onset  More than a month ago            Patient seen for bilateral hand strengthening with red putty for gross grasp, 10 repetitions for 2 sets Pinch:  Lateral pinch, 3 point and 2 point pinch each side for 10 repetitions for 2 sets.  Cues for prehension patterns. Patient spilled coffee during session on the table, reports this happens frequently.  Coordination skills:  Manipulation of 100 pegboard pieces to pick up, flip to opposite end and replace.  Cues for prehension patterns and turning with use of Index finger, slow to complete task and needs to continue to work on speed and dexterity.  Performed with both  right and left hands.    Typing drills multiple errors due to fingers not being on home row keys and lacks isolated finger movements to produce copying of text.                OT Education - 02/21/18 1942    Education provided  Yes    Education Details  typing, coordination skills    Person(s) Educated  Patient    Methods  Verbal cues;Explanation;Demonstration    Comprehension  Verbal cues required;Verbalized understanding;Returned demonstration          OT Long Term Goals - 01/19/18 0945      OT LONG TERM GOAL #1   Title  Pt. will improve Bilateral UE strength by 1 mm grade to assist with ADLs, and IADLs    Baseline  BUE strength: 4+/5 overall    Time  12    Period  Weeks    Status  Achieved      OT LONG TERM GOAL #2   Title  Pt. will improve grip strength to be able to pour himself a beverage.    Baseline  Pt. continues to be unable to hold and pour a bevrage.    Time  12    Period  Weeks    Status  Achieved    Target Date  --      OT LONG TERM GOAL #3   Title  Pt. will improve lateral pinch stength to be able to independently turn a key.     Baseline  Pt. continues to have difficulty holding, and turning  key.    Time  12    Period  Weeks    Status  Achieved      OT LONG TERM GOAL #4   Title  Pt. will improve Right hand [facility] skills to be able to independently grasp and use small objects needed for fixing car parts.    Baseline   45sec. on the 9 hole peg test. 01/19/2018 56 sec. on 9 hole peg test.    Time  12    Period  Weeks    Status  On-going    Target Date  04/13/18      OT LONG TERM GOAL #5   Title  Pt. will be demonstrate self-feeding with modified independence with minimal spillage.    Baseline  Pt. continues to require assistance, and has alot of spillage.    Period  Weeks    Status  On-going    Target Date  04/13/18      Long Term Additional Goals   Additional Long Term Goals  Yes      OT LONG TERM GOAL #6   Title  Pt. will manipulate  buttons, and cuff  buttons independently.    Baseline   Pt. has difficulty manipulating buttons    Time  12    Period  Weeks    Status  On-going    Target Date  04/13/18      OT LONG TERM GOAL #7   Title  Pt. will write 1 sentence efficiently with 90% legibility.    Baseline  name only 75% legibility    Time  12    Period  Weeks    Status  On-going    Target Date  04/13/18      OT LONG TERM GOAL #8   Title  Pt. will improve typing speed, and accuracy to be able to to type emails efficently    Baseline  Typing speed: 6 wpm, Accuracy: 33% with moderate cues for hand placement    Time  12    Period  Weeks    Status  On-going    Target Date  04/13/18      OT LONG TERM GOAL  #9   Baseline  Pt will transfers in and out of the shower with stand-by assistance    Time  12    Period  Weeks    Status  New    Target Date  04/13/18      OT LONG TERM GOAL  #10   TITLE  Pt will don and doff pants with min A, PRN    Baseline  Pt requires mod A to don and doff pants    Time  12    Period  Weeks    Status  New    Target Date  04/13/18      OT LONG TERM GOAL  #11   TITLE  Pt will don and doff socks modified independently using AE, PRN    Baseline  Pt has increased difficulty donning and doffing socks    Time  6    Period  Weeks    Status  New    Target Date  03/02/18      OT LONG TERM GOAL  #12   TITLE  Pt will independently tie shoes    Baseline  Pt requires assistance to tie shoes    Time  6    Period  Weeks    Status  New    Target Date  03/02/18            Plan - 02/21/18 1943    Clinical Impression Statement  Patient brought in coffee during session, removed the lid and during session he moved his left arm and knocked over coffee.  Assist to clean up area and clothing.  He reports this happens often and we discussed the need to keep the top on the coffee for safety and to minimize spills. Patient continues to work on typing skills, demonstrates difficulty with having  fingers on home row keys and often thinks he has his fingers on certain keys when fingers are one off of the spaces which results in all errors.  Continue to work towards attention to task, control of coordination skills to complete tasks, and to increase independence in daily activities.     Occupational performance deficits (Please refer to evaluation for details):  ADL's;IADL's    Rehab Potential  Good    Current Impairments/barriers affecting progress:  Positive Indicators: age, familiy support, motivation. Negative Indicators: Multiple comorbidities, and history of falls.    OT Frequency  2x / week    OT Duration  12 weeks  OT Treatment/Interventions  Self-care/ADL training;Therapeutic exercise;DME and/or AE instruction;Energy conservation;Neuromuscular education;Patient/family education;Therapeutic activities    Consulted and Agree with Plan of Care  Patient       Patient will benefit from skilled therapeutic intervention in order to improve the following deficits and impairments:  Pain, Decreased strength, Decreased coordination, Decreased balance, Impaired UE functional use, Decreased safety awareness, Decreased activity tolerance, Decreased cognition, Impaired flexibility, Impaired vision/preception  Visit Diagnosis: Muscle weakness (generalized)  Other lack of coordination    Problem List Patient Active Problem List   Diagnosis Date Noted  . Skin sore 12/01/2017  . Hematoma 10/18/2017  . Accidental fall 10/18/2017  . 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 Radicular pain in right arm 10/09/2016  . GERD (gastroesophageal reflux disease) 07/31/2016  . Trochanteric bursitis 03/03/2016  . Left hand pain 10/30/2015  . Fracture, finger, multiple sites 10/30/2015  . Colon cancer screening 07/23/2015  . Healthcare maintenance 07/23/2015  . Gout 02/18/2015  . Depression 01/06/2015  . Irritation of eyelid 08/20/2014  . Dupuytren's contracture 08/20/2014  . Cough 07/21/2014  .  Lumbar stenosis with neurogenic claudication 06/13/2014  . Preop cardiovascular exam 05/28/2014  . S/P deep brain stimulator placement 05/08/2014  . Joint pain 01/22/2014  . Medicare annual wellness visit, initial 01/19/2014  . Advance care planning 01/19/2014  . Parkinson's disease ([place]) 12/13/2013  . PTSD (post-traumatic stress disorder) 06/13/2013  . Erectile dysfunction 06/13/2013  . HLD (hyperlipidemia) 06/13/2013  . Hip pain 06/10/2013  . Pain in joint, shoulder region 06/10/2013  . Right leg swelling 06/03/2013  . Essential hypertension 06/03/2013  . Bradycardia by electrocardiogram 06/03/2013  . Obstructive sleep apnea 03/12/2013  . Spinal stenosis, lumbar region, with neurogenic claudication 12/14/2012   Achilles Dunk, OTR/L, CLT  Iracema Lanagan 02/21/2018, 7:50 PM  [place] MAIN [facility] SERVICES [place], [place], [address] Phone: [phone]   Fax:  [phone]  Name: JCEON ALVERIO MRN: [id number] Date of Birth: [birth year]

## 2018-02-19 ENCOUNTER — Ambulatory Visit: Payer: Medicare PPO | Admitting: Occupational Therapy

## 2018-02-19 ENCOUNTER — Encounter: Payer: Self-pay | Admitting: Speech Pathology

## 2018-02-19 ENCOUNTER — Ambulatory Visit (INDEPENDENT_AMBULATORY_CARE_PROVIDER_SITE_OTHER): Payer: 59 | Admitting: Psychology

## 2018-02-19 DIAGNOSIS — F331 Major depressive disorder, recurrent, moderate: Secondary | ICD-10-CM | POA: Diagnosis not present

## 2018-02-19 DIAGNOSIS — R278 Other lack of coordination: Secondary | ICD-10-CM

## 2018-02-19 DIAGNOSIS — R279 Unspecified lack of coordination: Secondary | ICD-10-CM | POA: Diagnosis not present

## 2018-02-19 DIAGNOSIS — F4323 Adjustment disorder with mixed anxiety and depressed mood: Secondary | ICD-10-CM | POA: Diagnosis not present

## 2018-02-19 DIAGNOSIS — G20 Parkinson's disease: Secondary | ICD-10-CM | POA: Diagnosis not present

## 2018-02-19 DIAGNOSIS — R2681 Unsteadiness on feet: Secondary | ICD-10-CM | POA: Diagnosis not present

## 2018-02-19 DIAGNOSIS — R293 Abnormal posture: Secondary | ICD-10-CM | POA: Diagnosis not present

## 2018-02-19 DIAGNOSIS — R2689 Other abnormalities of gait and mobility: Secondary | ICD-10-CM | POA: Diagnosis not present

## 2018-02-19 DIAGNOSIS — R296 Repeated falls: Secondary | ICD-10-CM | POA: Diagnosis not present

## 2018-02-19 DIAGNOSIS — M6281 Muscle weakness (generalized): Secondary | ICD-10-CM

## 2018-02-19 DIAGNOSIS — R262 Difficulty in walking, not elsewhere classified: Secondary | ICD-10-CM | POA: Diagnosis not present

## 2018-02-20 ENCOUNTER — Ambulatory Visit: Payer: Medicare PPO

## 2018-02-20 ENCOUNTER — Encounter: Payer: Self-pay | Admitting: Speech Pathology

## 2018-02-20 ENCOUNTER — Other Ambulatory Visit: Payer: Self-pay

## 2018-02-20 ENCOUNTER — Ambulatory Visit: Payer: Self-pay | Admitting: Physical Therapy

## 2018-02-20 DIAGNOSIS — R29898 Other symptoms and signs involving the musculoskeletal system: Secondary | ICD-10-CM

## 2018-02-20 DIAGNOSIS — G20 Parkinson's disease: Secondary | ICD-10-CM | POA: Diagnosis not present

## 2018-02-20 DIAGNOSIS — R293 Abnormal posture: Secondary | ICD-10-CM | POA: Diagnosis not present

## 2018-02-20 DIAGNOSIS — M6281 Muscle weakness (generalized): Secondary | ICD-10-CM

## 2018-02-20 DIAGNOSIS — R278 Other lack of coordination: Secondary | ICD-10-CM | POA: Diagnosis not present

## 2018-02-20 DIAGNOSIS — R262 Difficulty in walking, not elsewhere classified: Secondary | ICD-10-CM

## 2018-02-20 DIAGNOSIS — R2689 Other abnormalities of gait and mobility: Secondary | ICD-10-CM | POA: Diagnosis not present

## 2018-02-20 DIAGNOSIS — R296 Repeated falls: Secondary | ICD-10-CM | POA: Diagnosis not present

## 2018-02-20 DIAGNOSIS — R2681 Unsteadiness on feet: Secondary | ICD-10-CM | POA: Diagnosis not present

## 2018-02-20 DIAGNOSIS — R279 Unspecified lack of coordination: Secondary | ICD-10-CM

## 2018-02-20 DIAGNOSIS — R29818 Other symptoms and signs involving the nervous system: Secondary | ICD-10-CM

## 2018-02-20 NOTE — Therapy (Signed)
[place] MAIN [facility] SERVICES [address] [place], [place], [address] Phone: [phone]   Fax:  [phone]  Physical Therapy Treatment  Patient Details  Name: [name] MRN: [id number] Date of Birth: [birth year] Referring Provider (PT): Alonza Bogus   Encounter Date: 02/20/2018  PT End of Session - 02/20/18 1200    Visit Number  63    Number of Visits  73    Date for PT Re-Evaluation  03/08/18    Authorization Type  7/10    PT Start Time  0800    PT Stop Time  0845    PT Time Calculation (min)  45 min    Activity Tolerance  Patient tolerated treatment well;Other (comment)    Behavior During Therapy  WFL for tasks assessed/performed       Past Medical History:  Diagnosis Date  . Arthritis   . Bradycardia   . Cancer [facility]) 2013   skin cancer  . Depression    ptsd  . Dysrhythmia    chronic slow heart rate  . GERD (gastroesophageal reflux disease)   . Headache(784.0)    tension headaches non recent  . History of chicken pox   . History of kidney stones    passed  . Hypertension    treated with HCTZ  . Parkinson's disease ([place])    dx'ed 15 years ago  . PTSD (post-traumatic stress disorder)   . Shortness of breath dyspnea   . Sleep apnea    doesn't use C-pap  . Varicose veins     Past Surgical History:  Procedure Laterality Date  . CHOLECYSTECTOMY N/A 10/22/2014   Procedure: LAPAROSCOPIC CHOLECYSTECTOMY WITH INTRAOPERATIVE CHOLANGIOGRAM;  Surgeon: Dia Crawford III, MD;  Location: [facility] ORS;  Service: General;  Laterality: N/A;  . cyst removed      from lip as a child  . LUMBAR LAMINECTOMY/DECOMPRESSION MICRODISCECTOMY Bilateral 12/14/2012   Procedure: Bilateral lumbar three-four, four-five decompressive laminotomy/foraminotomy;  Surgeon: Charlie Pitter, MD;  Location: [place] NEURO ORS;  Service: Neurosurgery;  Laterality: Bilateral;  . PULSE GENERATOR IMPLANT Bilateral 12/13/2013   Procedure: Bilateral implantable pulse generator  placement;  Surgeon: Erline Levine, MD;  Location: [place] NEURO ORS;  Service: Neurosurgery;  Laterality: Bilateral;  Bilateral implantable pulse generator placement  . skin cancer removed     from ears,   12 lft arm  rt leg 15  . SUBTHALAMIC STIMULATOR BATTERY REPLACEMENT Bilateral 07/14/2017   Procedure: BILATERAL IMPLANTED PULSE GENERATOR CHANGE FOR DEEP BRAIN STIMULATOR;  Surgeon: Erline Levine, MD;  Location: [place];  Service: Neurosurgery;  Laterality: Bilateral;  . SUBTHALAMIC STIMULATOR INSERTION Bilateral 12/06/2013   Procedure: SUBTHALAMIC STIMULATOR INSERTION;  Surgeon: Erline Levine, MD;  Location: [place] NEURO ORS;  Service: Neurosurgery;  Laterality: Bilateral;  Bilateral deep brain stimulator placement    There were no vitals filed for this visit.  Subjective Assessment - 02/20/18 1158    Subjective  Pt reports having a "bad balance" day over the weekend with  more than 1 fall. No injuries to report. Denies pain today    Patient is accompained by:  Family member    Pertinent History  Patient referred for physical therapy due to wife's request for patient to get aquatic therapy to help with his conditioning. Pt is also complaining of chronic low back and R hip pain and frequent falls. 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 The last time  he was in the water he was very uncomfortable because he didn't feel like he had any control. Pt has a history of 2 low back surgeries in August 2014 and February 2016. Pt and wife are unable to recall exactly what was performed but they know that he had stenosis. Surgeries were performed by Dr. Annette Stable in [place]. Pt reports that his low back and R hip pain are constant. He describes the pain in the back as "dull" and the pain in the hip as "sharp." For both back and hip worst pain is 10/10, Best: 2/10, Present: 2/10. No particular episode of trauma to his low back or hip preceding the pain. Pt has a history  of Parkinson's with a deep brain stimulator. He ambulates with a 4 wheeled walker called a U-step (automatic brakes, audio cues for stepping). He states that he falls frequently and reports that he has had approximately 40-50 falls in the last 6 months. No specific recent changes in his health.       Enters/exits with A via ramp (ambulating) Session with 3# ankle wts for grounding throughout  Ambulation, blue dumbbells  Fwd 3 L  High knee march, 3 L  Side 3 L  Side with squat 3 L  Bkwd 3 L  Core with LE  Hip ab/add, B 20x ea  Hip ext, B 20x ea   Bench core with LE  Up and outs, B 20x ea  STS 20x  STS with single leg march, return to stand, controlled sit, B 10x  TUG (28') 5x, 24 to 18 seconds                          PT Education - 02/20/18 1159    Education provided  Yes    Education Details  progressed STS from bench with single leg knee lift, return to stand and controlled sit    Person(s) Educated  Patient    Methods  Explanation;Demonstration    Comprehension  Verbalized understanding;Returned demonstration;Verbal cues required;Tactile cues required       PT Short Term Goals - 01/11/18 1334      PT SHORT TERM GOAL #1   Title  Pt will perform HEP with family's supervision, for improved balance, transfers, and gait.      Baseline  patient reports compliance, however not every day; was unable to preform at the away home    Time  4    Period  Weeks    Status  Partially Met      PT SHORT TERM GOAL #2   Title  Pt will perform 5x sit<>stand in less than or equal to 15 seconds for improved efficiency and safety with transfers.    Baseline  19 seconds ; 15 seconds    Time  2    Period  Weeks    Status  Achieved        PT Long Term Goals - 02/09/18 1123      PT LONG TERM GOAL #1   Title  Pt will decrease 5TSTS by at least 3 seconds in order to demonstrate clinically significant improvement in LE strength     Baseline  02/08/17: 20.1 seconds  12/18: 17 seconds ; 09/14/17 =24.34 sec with assist due to poor static standing balance    Time  8    Period  Weeks    Status  Achieved      PT LONG TERM GOAL #2   Title  Pt will decrease TUG by at least 3.4s in order to demonstrate decreased fall risk     Baseline  02/08/17: 22.1s 12/18: 20 seconds 2/7: 19 seconds; 4/3: 23 seconds with walker, 09/14/17 15.12 sec 7/23: 15 seconds RW 8/22: no longer ambulatory    Time  8    Period  Weeks    Status  Deferred      PT LONG TERM GOAL #3   Title  Pt will improve BERG by at least 3 points in order to demonstrate clinically significant improvement in balance.    Baseline  02/08/17: 30/56 12/18: 38/56      Time  8    Period  Weeks    Status  Achieved      PT LONG TERM GOAL #4   Title  Pt/wife will verbalize understanding of plans for continued community fitness upon D/C from PT including aquatic therapy program    Baseline  02/08/17: pt currently reports being more comfortable in the water    Time  8    Period  Weeks    Status  Partially Met      PT LONG TERM GOAL #5   Title  Pt will decrease mODI scoreby at least 13 points in order demonstrate clinically significant reduction in pain/disability     Baseline  02/08/17: 54% 12/18: 46% 2/7: 42% 4/3; 50%, 09/14/17 42% 7/23: 42% 8/22 8/22:  31/56 9/26: 60% 10/25: 60%-patient confused    Time  8    Period  Weeks    Status  Partially Met    Target Date  03/08/18      PT LONG TERM GOAL #6   Title  Pt will improve BERG to 44/56  in order to demonstrate clinically significant improvement in balance for decreased fall risk    Baseline  12/18: 38/56 2/7: 41/56 4/3: 41/56, 09/14/17  19/56 7/23: 44/56 9/26: 20/56 10/25: unable to follow commands due to  medication     Time  8    Period  Weeks    Status  On-going    Target Date  03/08/18      PT LONG TERM GOAL #7   Title  Patient will reduce number of falls per week to <3 to reduce chance of injury and decrease frequent LOB.     Baseline  8/22:  6x/week 9/26: 2x/ day 10/25: had 4 falls this week     Time  8    Period  Weeks    Status  Partially Met      PT LONG TERM GOAL #8   Title  Patient will increase functional strength to 4+/5 to improve functional mobility and safety with transfers.     Baseline  8/22: 4-/5 gross 9/26: 3+/5 10/25; 4/5    Time  8    Period  Weeks    Status  Partially Met            Plan - 02/20/18 1201    Clinical Impression Statement  Pt tolerated session well. Able to perform progression of STS at bench overall well requirig occasional tactile/verbal cues. Demonstrates improved awareness of counterbalance and core contorl from posterior lean tendency. Overall Min A occasionally for balance disturbances with a few moderate/1 Max A needs. Pt definitely needs Min A with consistent cues for slowed pace entering/exiting pool. Will continue to assess safety/ability to enter/exit pool by ambulating and will switch to aquatic chair as necessary for safety.     Rehab Potential  Fair  PT Frequency  2x / week    PT Duration  8 weeks    PT Treatment/Interventions  ADLs/Self Care Home Management;Aquatic Therapy;Cryotherapy;Electrical Stimulation;Iontophoresis '4mg'$ /ml Dexamethasone;Moist Heat;Ultrasound;Contrast Bath;DME Instruction;Gait training;Stair training;Functional mobility training;Therapeutic activities;Therapeutic exercise;Balance training;Neuromuscular re-education;Patient/family education;Manual techniques;Wheelchair mobility training;Energy conservation;Passive range of motion    PT Next Visit Plan  progress balance, strength, aquatic therapy    PT Home Exercise Plan  seated strengthening    Consulted and Agree with Plan of Care  Patient    Family Member Consulted  wife       Patient will benefit from skilled therapeutic intervention in order to improve the following deficits and impairments:  Abnormal gait, Decreased balance, Decreased coordination, Decreased mobility, Impaired tone, Postural  dysfunction, Decreased strength, Decreased safety awareness, Improper body mechanics, Impaired flexibility  Visit Diagnosis: Muscle weakness (generalized)  Other lack of coordination  Other abnormalities of gait and mobility  Unsteadiness on feet  Unspecified lack of coordination  Abnormal posture  Difficulty walking  Parkinson's disease ([facility])  Repeated falls  Other symptoms and signs involving the musculoskeletal system  Other symptoms and signs involving the nervous system     Problem List Patient Active Problem List   Diagnosis Date Noted  . Skin sore 12/01/2017  . Hematoma 10/18/2017  . Accidental fall 10/18/2017  . 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 Radicular pain in right arm 10/09/2016  . GERD (gastroesophageal reflux disease) 07/31/2016  . Trochanteric bursitis 03/03/2016  . Left hand pain 10/30/2015  . Fracture, finger, multiple sites 10/30/2015  . Colon cancer screening 07/23/2015  . Healthcare maintenance 07/23/2015  . Gout 02/18/2015  . Depression 01/06/2015  . Irritation of eyelid 08/20/2014  . Dupuytren's contracture 08/20/2014  . Cough 07/21/2014  . Lumbar stenosis with neurogenic claudication 06/13/2014  . Preop cardiovascular exam 05/28/2014  . S/P deep brain stimulator placement 05/08/2014  . Joint pain 01/22/2014  . Medicare annual wellness visit, initial 01/19/2014  . Advance care planning 01/19/2014  . Parkinson's disease ([place]) 12/13/2013  . PTSD (post-traumatic stress disorder) 06/13/2013  . Erectile dysfunction 06/13/2013  . HLD (hyperlipidemia) 06/13/2013  . Hip pain 06/10/2013  . Pain in joint, shoulder region 06/10/2013  . Right leg swelling 06/03/2013  . Essential hypertension 06/03/2013  . Bradycardia by electrocardiogram 06/03/2013  . Obstructive sleep apnea 03/12/2013  . Spinal stenosis, lumbar region, with neurogenic claudication 12/14/2012    Larae Grooms 02/20/2018, 12:14 PM  [place] MAIN [facility] SERVICES [address] [place], [place], [address] Phone: [phone]   Fax:  [phone]  Name: [name] MRN: [id number] Date of Birth: [birth year]

## 2018-02-21 ENCOUNTER — Encounter: Payer: Self-pay | Admitting: Occupational Therapy

## 2018-02-21 ENCOUNTER — Encounter: Payer: Self-pay | Admitting: Speech Pathology

## 2018-02-21 NOTE — Therapy (Signed)
[place] MAIN [facility] SERVICES [address] [place], [place], [address] Phone: [phone]   Fax:  [phone]  Occupational Therapy Treatment  Patient Details  Name: [name] MRN: [id number] Date of Birth: 01-28-1951 No data recorded  Encounter Date: 02/19/2018  OT End of Session - 02/21/18 2013    Visit Number  28    Number of Visits  48    Date for OT Re-Evaluation  04/13/18    Authorization Type  Visit 4 of 10 for progress reporting period starting 01/22/2018    OT Start Time  0830    OT Stop Time  0915    OT Time Calculation (min)  45 min    Activity Tolerance  Patient tolerated treatment well    Behavior During Therapy  [facility] for tasks assessed/performed       Past Medical History:  Diagnosis Date  . Arthritis   . Bradycardia   . Cancer [facility]) 2013   skin cancer  . Depression    ptsd  . Dysrhythmia    chronic slow heart rate  . GERD (gastroesophageal reflux disease)   . Headache(784.0)    tension headaches non recent  . History of chicken pox   . History of kidney stones    passed  . Hypertension    treated with HCTZ  . Parkinson's disease ([place])    dx'ed 15 years ago  . PTSD (post-traumatic stress disorder)   . Shortness of breath dyspnea   . Sleep apnea    doesn't use C-pap  . Varicose veins     Past Surgical History:  Procedure Laterality Date  . CHOLECYSTECTOMY N/A 10/22/2014   Procedure: LAPAROSCOPIC CHOLECYSTECTOMY WITH INTRAOPERATIVE CHOLANGIOGRAM;  Surgeon: Dia Crawford III, MD;  Location: [facility] ORS;  Service: General;  Laterality: N/A;  . cyst removed      from lip as a child  . LUMBAR LAMINECTOMY/DECOMPRESSION MICRODISCECTOMY Bilateral 12/14/2012   Procedure: Bilateral lumbar three-four, four-five decompressive laminotomy/foraminotomy;  Surgeon: Charlie Pitter, MD;  Location: [place] NEURO ORS;  Service: Neurosurgery;  Laterality: Bilateral;  . PULSE GENERATOR IMPLANT Bilateral 12/13/2013   Procedure: Bilateral  implantable pulse generator placement;  Surgeon: Erline Levine, MD;  Location: [place] NEURO ORS;  Service: Neurosurgery;  Laterality: Bilateral;  Bilateral implantable pulse generator placement  . skin cancer removed     from ears,   12 lft arm  rt leg 15  . SUBTHALAMIC STIMULATOR BATTERY REPLACEMENT Bilateral 07/14/2017   Procedure: BILATERAL IMPLANTED PULSE GENERATOR CHANGE FOR DEEP BRAIN STIMULATOR;  Surgeon: Erline Levine, MD;  Location: [place];  Service: Neurosurgery;  Laterality: Bilateral;  . SUBTHALAMIC STIMULATOR INSERTION Bilateral 12/06/2013   Procedure: SUBTHALAMIC STIMULATOR INSERTION;  Surgeon: Erline Levine, MD;  Location: [place] NEURO ORS;  Service: Neurosurgery;  Laterality: Bilateral;  Bilateral deep brain stimulator placement    There were no vitals filed for this visit.  Subjective Assessment - 02/21/18 2012    Subjective   Patient reports he had an [place] weekend.  No complaints, Cues to talk louder during tx session.     Pertinent History  Pt. is a 67 y.o. mlae who has a history of Parkinson's Disease. Pt. has a Deep Brain Stimulator in place. Pt. has a history of Low Back Pain with 2 back surgeries in August 2014, and February 2016. Pt. has a history of multiple frequent falls occurring at a rate of 6x's a week.     Patient Stated Goals  To return home.  Currently in Pain?  No/denies    Pain Score  0-No pain          Patient seen this date for strengthening tasks of fingers with use of large moderate resistance Bulletin board with push pins.   Cues for prehension patterns and to push pins all the way in to the board.  Facilitation of small  inch Magnets with cues and manipulation skills to separate magnets from a bunch with one hand and  isolated finger use with index and then placing onto board in elevated position.  Cues for technique and isolated finger movements.   Manipulation of Grooved pegs with cues to turn using index and thumb in order to place into grid.                     OT Education - 02/21/18 2013    Education provided  Yes    Education Details  strength and coordination for home program          OT Long Term Goals - 01/19/18 0945      OT LONG TERM GOAL #1   Title  Pt. will improve Bilateral UE strength by 1 mm grade to assist with ADLs, and IADLs    Baseline  BUE strength: 4+/5 overall    Time  12    Period  Weeks    Status  Achieved      OT LONG TERM GOAL #2   Title  Pt. will improve grip strength to be able to pour himself a beverage.    Baseline  Pt. continues to be unable to hold and pour a bevrage.    Time  12    Period  Weeks    Status  Achieved    Target Date  --      OT LONG TERM GOAL #3   Title  Pt. will improve lateral pinch stength to be able to independently turn a key.     Baseline  Pt. continues to have difficulty holding, and turning  key.    Time  12    Period  Weeks    Status  Achieved      OT LONG TERM GOAL #4   Title  Pt. will improve Right hand [facility] skills to be able to independently grasp and use small objects needed for fixing car parts.    Baseline   45sec. on the 9 hole peg test. 01/19/2018 56 sec. on 9 hole peg test.    Time  12    Period  Weeks    Status  On-going    Target Date  04/13/18      OT LONG TERM GOAL #5   Title  Pt. will be demonstrate self-feeding with modified independence with minimal spillage.    Baseline  Pt. continues to require assistance, and has alot of spillage.    Period  Weeks    Status  On-going    Target Date  04/13/18      Long Term Additional Goals   Additional Long Term Goals  Yes      OT LONG TERM GOAL #6   Title  Pt. will manipulate buttons, and cuff buttons independently.    Baseline   Pt. has difficulty manipulating buttons    Time  12    Period  Weeks    Status  On-going    Target Date  04/13/18      OT LONG TERM GOAL #7   Title  Pt. will write 1 sentence efficiently with 90% legibility.    Baseline  name only 75% legibility     Time  12    Period  Weeks    Status  On-going    Target Date  04/13/18      OT LONG TERM GOAL #8   Title  Pt. will improve typing speed, and accuracy to be able to to type emails efficently    Baseline  Typing speed: 6 wpm, Accuracy: 33% with moderate cues for hand placement    Time  12    Period  Weeks    Status  On-going    Target Date  04/13/18      OT LONG TERM GOAL  #9   Baseline  Pt will transfers in and out of the shower with stand-by assistance    Time  12    Period  Weeks    Status  New    Target Date  04/13/18      OT LONG TERM GOAL  #10   TITLE  Pt will don and doff pants with min A, PRN    Baseline  Pt requires mod A to don and doff pants    Time  12    Period  Weeks    Status  New    Target Date  04/13/18      OT LONG TERM GOAL  #11   TITLE  Pt will don and doff socks modified independently using AE, PRN    Baseline  Pt has increased difficulty donning and doffing socks    Time  6    Period  Weeks    Status  New    Target Date  03/02/18      OT LONG TERM GOAL  #12   TITLE  Pt will independently tie shoes    Baseline  Pt requires assistance to tie shoes    Time  6    Period  Weeks    Status  New    Target Date  03/02/18            Plan - 02/21/18 2014    Clinical Impression Statement  Patient voice quality diminished and required repeated cues to speak up during session.  Patient required cues for redirection to task at times.  Continues to work towards improving strength and coordination skills to help with self care and IADL tasks at home.  Continue to work towards goals.      Occupational performance deficits (Please refer to evaluation for details):  ADL's;IADL's    Rehab Potential  Good    Current Impairments/barriers affecting progress:  Positive Indicators: age, familiy support, motivation. Negative Indicators: Multiple comorbidities, and history of falls.    OT Frequency  2x / week    OT Duration  12 weeks    OT Treatment/Interventions   Self-care/ADL training;Therapeutic exercise;DME and/or AE instruction;Energy conservation;Neuromuscular education;Patient/family education;Therapeutic activities    Consulted and Agree with Plan of Care  Patient       Patient will benefit from skilled therapeutic intervention in order to improve the following deficits and impairments:  Pain, Decreased strength, Decreased coordination, Decreased balance, Impaired UE functional use, Decreased safety awareness, Decreased activity tolerance, Decreased cognition, Impaired flexibility, Impaired vision/preception  Visit Diagnosis: Muscle weakness (generalized)  Other lack of coordination    Problem List Patient Active Problem List   Diagnosis Date Noted  . Skin sore 12/01/2017  . Hematoma 10/18/2017  . Accidental fall 10/18/2017  .  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 Radicular pain in right arm 10/09/2016  . GERD (gastroesophageal reflux disease) 07/31/2016  . Trochanteric bursitis 03/03/2016  . Left hand pain 10/30/2015  . Fracture, finger, multiple sites 10/30/2015  . Colon cancer screening 07/23/2015  . Healthcare maintenance 07/23/2015  . Gout 02/18/2015  . Depression 01/06/2015  . Irritation of eyelid 08/20/2014  . Dupuytren's contracture 08/20/2014  . Cough 07/21/2014  . Lumbar stenosis with neurogenic claudication 06/13/2014  . Preop cardiovascular exam 05/28/2014  . S/P deep brain stimulator placement 05/08/2014  . Joint pain 01/22/2014  . Medicare annual wellness visit, initial 01/19/2014  . Advance care planning 01/19/2014  . Parkinson's disease ([place]) 12/13/2013  . PTSD (post-traumatic stress disorder) 06/13/2013  . Erectile dysfunction 06/13/2013  . HLD (hyperlipidemia) 06/13/2013  . Hip pain 06/10/2013  . Pain in joint, shoulder region 06/10/2013  . Right leg swelling 06/03/2013  . Essential hypertension 06/03/2013  . Bradycardia by electrocardiogram 06/03/2013  . Obstructive sleep apnea 03/12/2013  . Spinal  stenosis, lumbar region, with neurogenic claudication 12/14/2012   Achilles Dunk, OTR/L, CLT  Lovett,[name] 02/21/2018, 8:18 PM  [place] MAIN [facility] SERVICES [address] [place], [place], [address] Phone: [phone]   Fax:  [phone]  Name: [name] MRN: [id number] Date of Birth: 05/21/1950

## 2018-02-22 ENCOUNTER — Encounter: Payer: Self-pay | Admitting: Speech Pathology

## 2018-02-22 ENCOUNTER — Ambulatory Visit: Payer: Self-pay

## 2018-02-26 ENCOUNTER — Encounter: Payer: Self-pay | Admitting: Occupational Therapy

## 2018-02-26 ENCOUNTER — Ambulatory Visit: Payer: Medicare PPO | Admitting: Occupational Therapy

## 2018-02-26 ENCOUNTER — Encounter: Payer: Self-pay | Admitting: Speech Pathology

## 2018-02-26 DIAGNOSIS — R296 Repeated falls: Secondary | ICD-10-CM | POA: Diagnosis not present

## 2018-02-26 DIAGNOSIS — R262 Difficulty in walking, not elsewhere classified: Secondary | ICD-10-CM | POA: Diagnosis not present

## 2018-02-26 DIAGNOSIS — R278 Other lack of coordination: Secondary | ICD-10-CM | POA: Diagnosis not present

## 2018-02-26 DIAGNOSIS — G20 Parkinson's disease: Secondary | ICD-10-CM | POA: Diagnosis not present

## 2018-02-26 DIAGNOSIS — M6281 Muscle weakness (generalized): Secondary | ICD-10-CM | POA: Diagnosis not present

## 2018-02-26 DIAGNOSIS — R279 Unspecified lack of coordination: Secondary | ICD-10-CM | POA: Diagnosis not present

## 2018-02-26 DIAGNOSIS — R2681 Unsteadiness on feet: Secondary | ICD-10-CM | POA: Diagnosis not present

## 2018-02-26 DIAGNOSIS — R2689 Other abnormalities of gait and mobility: Secondary | ICD-10-CM | POA: Diagnosis not present

## 2018-02-26 DIAGNOSIS — R293 Abnormal posture: Secondary | ICD-10-CM | POA: Diagnosis not present

## 2018-02-26 NOTE — Therapy (Addendum)
[place] MAIN [facility] SERVICES [address] [place], [place], [address] Phone: [phone]   Fax:  [phone]  Occupational Therapy Treatment  Patient Details  Name: [name] MRN: [id number] Date of Birth: 1950-12-21 No data recorded  Encounter Date: 02/26/2018  OT End of Session - 02/26/18 1101    Visit Number  29    Number of Visits  48    Date for OT Re-Evaluation  04/13/18    Authorization Type  Visit 5 of 10 for progress reporting period starting 01/22/2018    OT Start Time  1045    OT Stop Time  1130    OT Time Calculation (min)  45 min    Activity Tolerance  Patient tolerated treatment well    Behavior During Therapy  [facility] for tasks assessed/performed       Past Medical History:  Diagnosis Date  . Arthritis   . Bradycardia   . Cancer [place] [facility]) 2013   skin cancer  . Depression    ptsd  . Dysrhythmia    chronic slow heart rate  . GERD (gastroesophageal reflux disease)   . Headache(784.0)    tension headaches non recent  . History of chicken pox   . History of kidney stones    passed  . Hypertension    treated with HCTZ  . Parkinson's disease ([place])    dx'ed 15 years ago  . PTSD (post-traumatic stress disorder)   . Shortness of breath dyspnea   . Sleep apnea    doesn't use C-pap  . Varicose veins     Past Surgical History:  Procedure Laterality Date  . CHOLECYSTECTOMY N/A 10/22/2014   Procedure: LAPAROSCOPIC CHOLECYSTECTOMY WITH INTRAOPERATIVE CHOLANGIOGRAM;  Surgeon: Dia Crawford III, MD;  Location: [facility] ORS;  Service: General;  Laterality: N/A;  . cyst removed      from lip as a child  . LUMBAR LAMINECTOMY/DECOMPRESSION MICRODISCECTOMY Bilateral 12/14/2012   Procedure: Bilateral lumbar three-four, four-five decompressive laminotomy/foraminotomy;  Surgeon: Charlie Pitter, MD;  Location: [place] NEURO ORS;  Service: Neurosurgery;  Laterality: Bilateral;  . PULSE GENERATOR IMPLANT Bilateral 12/13/2013   Procedure: Bilateral  implantable pulse generator placement;  Surgeon: Erline Levine, MD;  Location: [place] NEURO ORS;  Service: Neurosurgery;  Laterality: Bilateral;  Bilateral implantable pulse generator placement  . skin cancer removed     from ears,   12 lft arm  rt leg 15  . SUBTHALAMIC STIMULATOR BATTERY REPLACEMENT Bilateral 07/14/2017   Procedure: BILATERAL IMPLANTED PULSE GENERATOR CHANGE FOR DEEP BRAIN STIMULATOR;  Surgeon: Erline Levine, MD;  Location: [place];  Service: Neurosurgery;  Laterality: Bilateral;  . SUBTHALAMIC STIMULATOR INSERTION Bilateral 12/06/2013   Procedure: SUBTHALAMIC STIMULATOR INSERTION;  Surgeon: Erline Levine, MD;  Location: [place] NEURO ORS;  Service: Neurosurgery;  Laterality: Bilateral;  Bilateral deep brain stimulator placement    There were no vitals filed for this visit.  Subjective Assessment - 02/26/18 1055    Subjective   Pt reports that he is doing okay today and that he is getting to know his new caregiver.    Patient is accompained by:  Family member    Pertinent History  Pt. is a 67 y.o. mlae who has a history of Parkinson's Disease. Pt. has a Deep Brain Stimulator in place. Pt. has a history of Low Back Pain with 2 back surgeries in August 2014, and February 2016. Pt. has a history of multiple frequent falls occurring at a rate of 6x's a week.  Limitations  Frequent falls, limited motor control, and [place].     Patient Stated Goals  To return home.    Currently in Pain?  Yes    Pain Score  3     Pain Location  Back    Pain Orientation  Lower    Pain Descriptors / Indicators  Aching    Pain Type  Chronic pain    Pain Onset  More than a month ago    Pain Frequency  Constant    Aggravating Factors   Sitting and standing      OT TREATMENT  Neuromuscular re-education:   Pt. performed right hand [facility] skills training to improve speed and dexterity needed for ADL tasks and writing. Pt. demonstrated grasping 1 inch sticks on the Purdue pegboard. Pt. performed grasping each item  with his 2nd digit and thumb and placing them on the pegboard. Pt. Is unable to store objects in his palm and translate them to his fingertips. Pt frequently dropped pegs and required increased time to retrieve and reposition them in his hand. Pt required verbal cuing to use his 2nd digit opposed to his thumb to grasp items. Pt dropped x5+ pegs during the task. Pt. completed right hand [facility] task that required him to grasp 1" resistive cubes alternating thumb opposition to the tip of the 2nd and 3rd digits with the board placed at a vertical angle. Pt. worked on pressing the cubes back into place while isolating the 2nd digit in extension. Pt required verbal cuing to use 2nd digit isolated to secure cubes onto board. Pt dropped x4 cubes during the task.   Self-care:   Pt worked on [facility] skills grasping, and manipulating knots using string and knots of varying degrees of difficulty. Pt required increased time to untie tight knots. Pt completed task in preparation for tying and untying his shoes. Pt demonstrates difficulty tying his shoes when shoe is on his foot. Pt is able to sequence steps of shoe tying without difficulty, however has trouble physically executing steps. Pt required min assistance for hand placement while tying his shoe.                 OT Education - 02/26/18 1100    Education provided  Yes    Education Details  [facility] skills and Animator) Educated  Patient    Methods  Verbal cues;Explanation;Demonstration    Comprehension  Verbal cues required;Verbalized understanding;Returned demonstration          OT Long Term Goals - 01/19/18 0945      OT LONG TERM GOAL #1   Title  Pt. will improve Bilateral UE strength by 1 mm grade to assist with ADLs, and IADLs    Baseline  BUE strength: 4+/5 overall    Time  12    Period  Weeks    Status  Achieved      OT LONG TERM GOAL #2   Title  Pt. will improve grip strength to be able to pour himself a  beverage.    Baseline  Pt. continues to be unable to hold and pour a bevrage.    Time  12    Period  Weeks    Status  Achieved    Target Date  --      OT LONG TERM GOAL #3   Title  Pt. will improve lateral pinch stength to be able to independently turn a key.     Baseline  Pt.  continues to have difficulty holding, and turning  key.    Time  12    Period  Weeks    Status  Achieved      OT LONG TERM GOAL #4   Title  Pt. will improve Right hand [facility] skills to be able to independently grasp and use small objects needed for fixing car parts.    Baseline   45sec. on the 9 hole peg test. 01/19/2018 56 sec. on 9 hole peg test.    Time  12    Period  Weeks    Status  On-going    Target Date  04/13/18      OT LONG TERM GOAL #5   Title  Pt. will be demonstrate self-feeding with modified independence with minimal spillage.    Baseline  Pt. continues to require assistance, and has alot of spillage.    Period  Weeks    Status  On-going    Target Date  04/13/18      Long Term Additional Goals   Additional Long Term Goals  Yes      OT LONG TERM GOAL #6   Title  Pt. will manipulate buttons, and cuff buttons independently.    Baseline   Pt. has difficulty manipulating buttons    Time  12    Period  Weeks    Status  On-going    Target Date  04/13/18      OT LONG TERM GOAL #7   Title  Pt. will write 1 sentence efficiently with 90% legibility.    Baseline  name only 75% legibility    Time  12    Period  Weeks    Status  On-going    Target Date  04/13/18      OT LONG TERM GOAL #8   Title  Pt. will improve typing speed, and accuracy to be able to to type emails efficently    Baseline  Typing speed: 6 wpm, Accuracy: 33% with moderate cues for hand placement    Time  12    Period  Weeks    Status  On-going    Target Date  04/13/18      OT LONG TERM GOAL  #9   Baseline  Pt will transfers in and out of the shower with stand-by assistance    Time  12    Period  Weeks    Status  New     Target Date  04/13/18      OT LONG TERM GOAL  #10   TITLE  Pt will don and doff pants with min A, PRN    Baseline  Pt requires mod A to don and doff pants    Time  12    Period  Weeks    Status  New    Target Date  04/13/18      OT LONG TERM GOAL  #11   TITLE  Pt will don and doff socks modified independently using AE, PRN    Baseline  Pt has increased difficulty donning and doffing socks    Time  6    Period  Weeks    Status  New    Target Date  03/02/18      OT LONG TERM GOAL  #12   TITLE  Pt will independently tie shoes    Baseline  Pt requires assistance to tie shoes    Time  6    Period  Weeks    Status  New    Target Date  03/02/18            Plan - 02/26/18 1104    Clinical Impression Statement Pt continues to work on [facility] skills and hand strength. Pt requires increased assistance at home including assistance while going to the restroom as a result of muscle weakness and impaired balance. Pt continues to work to improve [facility] skills and hand strength in order to promote independence during daily tasks including tying shoes, opening containers, and bathing.     Occupational performance deficits (Please refer to evaluation for details):  ADL's;IADL's    Rehab Potential  Good    Current Impairments/barriers affecting progress:  Positive Indicators: age, familiy support, motivation. Negative Indicators: Multiple comorbidities, and history of falls.    OT Frequency  2x / week    OT Duration  12 weeks    OT Treatment/Interventions  Self-care/ADL training;Therapeutic exercise;DME and/or AE instruction;Energy conservation;Neuromuscular education;Patient/family education;Therapeutic activities    Clinical Decision Making  Several treatment options, min-mod task modification necessary    Consulted and Agree with Plan of Care  Patient       Patient will benefit from skilled therapeutic intervention in order to improve the following deficits and impairments:  Pain, Decreased  strength, Decreased coordination, Decreased balance, Impaired UE functional use, Decreased safety awareness, Decreased activity tolerance, Decreased cognition, Impaired flexibility, Impaired vision/preception  Visit Diagnosis: Muscle weakness (generalized)    Problem List Patient Active Problem List   Diagnosis Date Noted  . Skin sore 12/01/2017  . Hematoma 10/18/2017  . Accidental fall 10/18/2017  . 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 Radicular pain in right arm 10/09/2016  . GERD (gastroesophageal reflux disease) 07/31/2016  . Trochanteric bursitis 03/03/2016  . Left hand pain 10/30/2015  . Fracture, finger, multiple sites 10/30/2015  . Colon cancer screening 07/23/2015  . Healthcare maintenance 07/23/2015  . Gout 02/18/2015  . Depression 01/06/2015  . Irritation of eyelid 08/20/2014  . Dupuytren's contracture 08/20/2014  . Cough 07/21/2014  . Lumbar stenosis with neurogenic claudication 06/13/2014  . Preop cardiovascular exam 05/28/2014  . S/P deep brain stimulator placement 05/08/2014  . Joint pain 01/22/2014  . Medicare annual wellness visit, initial 01/19/2014  . Advance care planning 01/19/2014  . Parkinson's disease ([place]) 12/13/2013  . PTSD (post-traumatic stress disorder) 06/13/2013  . Erectile dysfunction 06/13/2013  . HLD (hyperlipidemia) 06/13/2013  . Hip pain 06/10/2013  . Pain in joint, shoulder region 06/10/2013  . Right leg swelling 06/03/2013  . Essential hypertension 06/03/2013  . Bradycardia by electrocardiogram 06/03/2013  . Obstructive sleep apnea 03/12/2013  . Spinal stenosis, lumbar region, with neurogenic claudication 12/14/2012    Oliver Hum, OTS 02/26/2018, 11:07 AM   This entire session was performed under direct supervision and direction of a licensed therapist/therapist assistant . I have personally read, edited and approve of the note as written.  Harrel Carina, MS, OTR/L   [place] MAIN  [facility] SERVICES [address] [place], [place], [address] Phone: [phone]   Fax:  [phone]  Name: JAMASON PECKHAM MRN: [id number] Date of Birth: June 27, 1950

## 2018-02-27 ENCOUNTER — Encounter: Payer: Self-pay | Admitting: Speech Pathology

## 2018-02-28 ENCOUNTER — Encounter: Payer: Self-pay | Admitting: Speech Pathology

## 2018-03-01 ENCOUNTER — Ambulatory Visit: Payer: Medicare PPO | Admitting: Physical Therapy

## 2018-03-01 ENCOUNTER — Ambulatory Visit: Payer: Self-pay | Admitting: Internal Medicine

## 2018-03-01 ENCOUNTER — Encounter: Payer: Self-pay | Admitting: Speech Pathology

## 2018-03-04 DIAGNOSIS — G4733 Obstructive sleep apnea (adult) (pediatric): Secondary | ICD-10-CM | POA: Diagnosis not present

## 2018-03-04 DIAGNOSIS — G20 Parkinson's disease: Secondary | ICD-10-CM | POA: Diagnosis not present

## 2018-03-05 ENCOUNTER — Encounter: Payer: Self-pay | Admitting: Speech Pathology

## 2018-03-05 ENCOUNTER — Ambulatory Visit (INDEPENDENT_AMBULATORY_CARE_PROVIDER_SITE_OTHER): Payer: Medicare PPO | Admitting: Psychology

## 2018-03-05 DIAGNOSIS — F331 Major depressive disorder, recurrent, moderate: Secondary | ICD-10-CM

## 2018-03-05 DIAGNOSIS — F4323 Adjustment disorder with mixed anxiety and depressed mood: Secondary | ICD-10-CM | POA: Diagnosis not present

## 2018-03-05 DIAGNOSIS — L282 Other prurigo: Secondary | ICD-10-CM | POA: Diagnosis not present

## 2018-03-05 DIAGNOSIS — Z872 Personal history of diseases of the skin and subcutaneous tissue: Secondary | ICD-10-CM | POA: Diagnosis not present

## 2018-03-05 DIAGNOSIS — Z86018 Personal history of other benign neoplasm: Secondary | ICD-10-CM | POA: Diagnosis not present

## 2018-03-05 DIAGNOSIS — L853 Xerosis cutis: Secondary | ICD-10-CM | POA: Diagnosis not present

## 2018-03-05 DIAGNOSIS — L578 Other skin changes due to chronic exposure to nonionizing radiation: Secondary | ICD-10-CM | POA: Diagnosis not present

## 2018-03-05 DIAGNOSIS — Z1283 Encounter for screening for malignant neoplasm of skin: Secondary | ICD-10-CM | POA: Diagnosis not present

## 2018-03-05 DIAGNOSIS — L821 Other seborrheic keratosis: Secondary | ICD-10-CM | POA: Diagnosis not present

## 2018-03-06 ENCOUNTER — Ambulatory Visit: Payer: Medicare PPO | Admitting: Occupational Therapy

## 2018-03-06 ENCOUNTER — Ambulatory Visit: Payer: Medicare PPO

## 2018-03-06 ENCOUNTER — Encounter: Payer: Self-pay | Admitting: Speech Pathology

## 2018-03-06 ENCOUNTER — Encounter: Payer: Self-pay | Admitting: Occupational Therapy

## 2018-03-06 DIAGNOSIS — M6281 Muscle weakness (generalized): Secondary | ICD-10-CM | POA: Diagnosis not present

## 2018-03-06 DIAGNOSIS — R2681 Unsteadiness on feet: Secondary | ICD-10-CM

## 2018-03-06 DIAGNOSIS — R278 Other lack of coordination: Secondary | ICD-10-CM | POA: Diagnosis not present

## 2018-03-06 DIAGNOSIS — R296 Repeated falls: Secondary | ICD-10-CM | POA: Diagnosis not present

## 2018-03-06 DIAGNOSIS — R2689 Other abnormalities of gait and mobility: Secondary | ICD-10-CM

## 2018-03-06 DIAGNOSIS — G20 Parkinson's disease: Secondary | ICD-10-CM | POA: Diagnosis not present

## 2018-03-06 DIAGNOSIS — R293 Abnormal posture: Secondary | ICD-10-CM | POA: Diagnosis not present

## 2018-03-06 DIAGNOSIS — R262 Difficulty in walking, not elsewhere classified: Secondary | ICD-10-CM | POA: Diagnosis not present

## 2018-03-06 DIAGNOSIS — R279 Unspecified lack of coordination: Secondary | ICD-10-CM | POA: Diagnosis not present

## 2018-03-06 NOTE — Therapy (Signed)
[place] MAIN [facility] SERVICES [address] [place], [place], [address] Phone: [phone]   Fax:  [phone]  Physical Therapy Treatment Physical Therapy Progress Note/ DISCHARGE   Dates of reporting period  01/11/18   to   03/06/18   Patient Details  Name: [name] MRN: [id number] Date of Birth: [birth year] Referring Provider (PT): Alonza Bogus   Encounter Date: 03/06/2018  PT End of Session - 03/06/18 1128    Visit Number  103    Number of Visits  73    Date for PT Re-Evaluation  03/08/18    Authorization Type  8/10    PT Start Time  0947    PT Stop Time  1053    PT Time Calculation (min)  38 min    Activity Tolerance  Patient tolerated treatment well    Behavior During Therapy  [facility] for tasks assessed/performed       Past Medical History:  Diagnosis Date  . Arthritis   . Bradycardia   . Cancer [facility]) 2013   skin cancer  . Depression    ptsd  . Dysrhythmia    chronic slow heart rate  . GERD (gastroesophageal reflux disease)   . Headache(784.0)    tension headaches non recent  . History of chicken pox   . History of kidney stones    passed  . Hypertension    treated with HCTZ  . Parkinson's disease ([place])    dx'ed 15 years ago  . PTSD (post-traumatic stress disorder)   . Shortness of breath dyspnea   . Sleep apnea    doesn't use C-pap  . Varicose veins     Past Surgical History:  Procedure Laterality Date  . CHOLECYSTECTOMY N/A 10/22/2014   Procedure: LAPAROSCOPIC CHOLECYSTECTOMY WITH INTRAOPERATIVE CHOLANGIOGRAM;  Surgeon: Dia Crawford III, MD;  Location: [facility] ORS;  Service: General;  Laterality: N/A;  . cyst removed      from lip as a child  . LUMBAR LAMINECTOMY/DECOMPRESSION MICRODISCECTOMY Bilateral 12/14/2012   Procedure: Bilateral lumbar three-four, four-five decompressive laminotomy/foraminotomy;  Surgeon: Charlie Pitter, MD;  Location: [place] NEURO ORS;  Service: Neurosurgery;  Laterality: Bilateral;  . PULSE  GENERATOR IMPLANT Bilateral 12/13/2013   Procedure: Bilateral implantable pulse generator placement;  Surgeon: Erline Levine, MD;  Location: [place] NEURO ORS;  Service: Neurosurgery;  Laterality: Bilateral;  Bilateral implantable pulse generator placement  . skin cancer removed     from ears,   12 lft arm  rt leg 15  . SUBTHALAMIC STIMULATOR BATTERY REPLACEMENT Bilateral 07/14/2017   Procedure: BILATERAL IMPLANTED PULSE GENERATOR CHANGE FOR DEEP BRAIN STIMULATOR;  Surgeon: Erline Levine, MD;  Location: [place];  Service: Neurosurgery;  Laterality: Bilateral;  . SUBTHALAMIC STIMULATOR INSERTION Bilateral 12/06/2013   Procedure: SUBTHALAMIC STIMULATOR INSERTION;  Surgeon: Erline Levine, MD;  Location: [place] NEURO ORS;  Service: Neurosurgery;  Laterality: Bilateral;  Bilateral deep brain stimulator placement    There were no vitals filed for this visit.   Subjective Assessment - 03/06/18 1126    Subjective  Patient reports having another fall this morning. Fell when in the bathroom, did not wait for assistance. Has not been carrying over in home environment per patient's wife and CNA report.     Patient is accompained by:  Family member    Pertinent History  Patient referred for physical therapy due to wife's request for patient to get aquatic therapy to help with his conditioning. Pt is also complaining of chronic low back  and R hip pain and frequent falls. 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 The last time he was in the water he was very uncomfortable because he didn't feel like he had any control. Pt has a history of 2 low back surgeries in August 2014 and February 2016. Pt and wife are unable to recall exactly what was performed but they know that he had stenosis. Surgeries were performed by Dr. Annette Mcgee in [place]. Pt reports that his low back and R hip pain are constant. He describes the pain in the back as "dull" and the pain in the hip as "sharp."  For both back and hip worst pain is 10/10, Best: 2/10, Present: 2/10. No particular episode of trauma to his low back or hip preceding the pain. Pt has a history of Parkinson's with a deep brain stimulator. He ambulates with a 4 wheeled walker called a U-step (automatic brakes, audio cues for stepping). He states that he falls frequently and reports that he has had approximately 40-50 falls in the last 6 months. No specific recent changes in his health.     Currently in Pain?  Yes    Pain Score  4     Pain Location  Back    Pain Orientation  Upper    Pain Descriptors / Indicators  Aching    Pain Type  Acute pain    Pain Onset  Today    Pain Frequency  Intermittent         BERG: 17/56  SPT: excessive posterior trunk lean: x2 trials with Mod A to prevent LOB; education to caregiver about proper body mechanics for transfer at home.    MMT: 4/5  LE strength  MODI:  64%  Seated:  Marches kicking ball to PT via knee x 12 each LE, 2 sets for reaction timing, strength, and postural control  LAQ kicking soccer ball 15x each LE, 2 sets  Kicking ball between two cones "goal" 10 goals made each LE for coordination  Balloon taps in seated reaching inside and outside BOS x 4 minutes    Due to patient's limited carryover into home environment and continued frequency of falling patient would best benefit from home health therapy for safe mobility in the home environment and to decrease falls and immobility. Patient has excessive posterior trunk lean resulting in loss of balance and poor awareness of body in space.  I will be happy to see this patient again in the future as needed.    [facility] PT Assessment - 03/06/18 0001      Berg Balance Test   Sit to Stand  Able to stand using hands after several tries    Standing Unsupported  Able to stand 30 seconds unsupported    Sitting with Back Unsupported but Feet Supported on Floor or Stool  Able to sit 2 minutes under supervision    Stand to Sit  Sits  independently, has uncontrolled descent    Transfers  Able to transfer with verbal cueing and /or supervision    Standing Unsupported with Eyes Closed  Unable to keep eyes closed 3 seconds but stays steady    Standing Ubsupported with Feet Together  Needs help to attain position and unable to hold for 15 seconds    From Standing, Reach Forward with Outstretched Arm  Reaches forward but needs supervision    From Standing Position, Pick up Object from Floor  Unable to  pick up and needs supervision    From Standing Position, Turn to Look Behind Over each Shoulder  Turn sideways only but maintains balance    Turn 360 Degrees  Needs close supervision or verbal cueing    Standing Unsupported, Alternately Place Feet on Step/Stool  Able to complete >2 steps/needs minimal assist    Standing Unsupported, One Foot in Front  Loses balance while stepping or standing    Standing on One Leg  Unable to try or needs assist to prevent fall    Total Score  17                           PT Education - 03/06/18 1128    Education provided  Yes    Education Details  d/c to home health PT     Person(s) Educated  Patient;Spouse    Methods  Explanation    Comprehension  Verbalized understanding       PT Short Term Goals - 03/06/18 1140      PT SHORT TERM GOAL #1   Title  Pt will perform HEP with family's supervision, for improved balance, transfers, and gait.      Baseline  non compliant     Time  4    Period  Weeks    Status  Not Met      PT SHORT TERM GOAL #2   Title  Pt will perform 5x sit<>stand in less than or equal to 15 seconds for improved efficiency and safety with transfers.    Baseline  19 seconds ; 15 seconds    Time  2    Period  Weeks    Status  Achieved        PT Long Term Goals - 03/06/18 1141      PT LONG TERM GOAL #1   Title  Pt will decrease 5TSTS by at least 3 seconds in order to demonstrate clinically significant improvement in LE strength     Baseline   02/08/17: 20.1 seconds 12/18: 17 seconds ; 09/14/17 =24.34 sec with assist due to poor static standing balance    Time  8    Period  Weeks    Status  Achieved      PT LONG TERM GOAL #2   Title  Pt will decrease TUG by at least 3.4s in order to demonstrate decreased fall risk     Baseline  02/08/17: 22.1s 12/18: 20 seconds 2/7: 19 seconds; 4/3: 23 seconds with walker, 09/14/17 15.12 sec 7/23: 15 seconds RW 8/22: no longer ambulatory    Time  8    Period  Weeks    Status  Deferred      PT LONG TERM GOAL #3   Title  Pt will improve BERG by at least 3 points in order to demonstrate clinically significant improvement in balance.    Baseline  02/08/17: 30/56 12/18: 38/56      Time  8    Period  Weeks    Status  Achieved      PT LONG TERM GOAL #4   Title  Pt/wife will verbalize understanding of plans for continued community fitness upon D/C from PT including aquatic therapy program    Baseline  02/08/17: pt currently reports being more comfortable in the water 11/19: home health     Time  8    Period  Weeks    Status  Not Met  PT LONG TERM GOAL #5   Title  Pt will decrease mODI scoreby at least 13 points in order demonstrate clinically significant reduction in pain/disability     Baseline  02/08/17: 54% 12/18: 46% 2/7: 42% 4/3; 50%, 09/14/17 42% 7/23: 42% 8/22 8/22:  31/56 9/26: 60% 10/25: 60%-patient confused 11/19: 64%    Time  8    Period  Weeks    Status  Partially Met      PT LONG TERM GOAL #6   Title  Pt will improve BERG to 44/56  in order to demonstrate clinically significant improvement in balance for decreased fall risk    Baseline  12/18: 38/56 2/7: 41/56 4/3: 41/56, 09/14/17  19/56 7/23: 44/56 9/26: 20/56 10/25: unable to follow commands due to  medication ; 11/19: 17/56    Time  8    Period  Weeks    Status  Not Met      PT LONG TERM GOAL #7   Title  Patient will reduce number of falls per week to <3 to reduce chance of injury and decrease frequent LOB.     Baseline   8/22: 6x/week 9/26: 2x/ day 10/25: had 4 falls this week 11/19: 3x/week     Time  8    Period  Weeks    Status  Partially Met      PT LONG TERM GOAL #8   Title  Patient will increase functional strength to 4+/5 to improve functional mobility and safety with transfers.     Baseline  8/22: 4-/5 gross 9/26: 3+/5 10/25; 4/5 11/19: 4/5     Time  8    Period  Weeks    Status  Partially Met            Plan - 03/06/18 1140    Clinical Impression Statement  Due to patient's limited carryover into home environment and continued frequency of falling patient would best benefit from home health therapy for safe mobility in the home environment and to decrease falls and immobility. Patient has excessive posterior trunk lean resulting in loss of balance and poor awareness of body in space.  I will be happy to see this patient again in the future as needed.    Rehab Potential  Fair    PT Frequency  2x / week    PT Duration  8 weeks    PT Treatment/Interventions  ADLs/Self Care Home Management;Aquatic Therapy;Cryotherapy;Electrical Stimulation;Iontophoresis 69m/ml Dexamethasone;Moist Heat;Ultrasound;Contrast Bath;DME Instruction;Gait training;Stair training;Functional mobility training;Therapeutic activities;Therapeutic exercise;Balance training;Neuromuscular re-education;Patient/family education;Manual techniques;Wheelchair mobility training;Energy conservation;Passive range of motion    PT Home Exercise Plan  seated strengthening    Consulted and Agree with Plan of Care  Patient    Family Member Consulted  wife       Patient will benefit from skilled therapeutic intervention in order to improve the following deficits and impairments:  Abnormal gait, Decreased balance, Decreased coordination, Decreased mobility, Impaired tone, Postural dysfunction, Decreased strength, Decreased safety awareness, Improper body mechanics, Impaired flexibility  Visit Diagnosis: Muscle weakness (generalized)  Other  lack of coordination  Other abnormalities of gait and mobility  Unsteadiness on feet     Problem List Patient Active Problem List   Diagnosis Date Noted  . Skin sore 12/01/2017  . Hematoma 10/18/2017  . Accidental fall 10/18/2017  . 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 Radicular pain in right arm 10/09/2016  . GERD (gastroesophageal reflux disease) 07/31/2016  . Trochanteric bursitis 03/03/2016  . Left hand pain 10/30/2015  .  Fracture, finger, multiple sites 10/30/2015  . Colon cancer screening 07/23/2015  . Healthcare maintenance 07/23/2015  . Gout 02/18/2015  . Depression 01/06/2015  . Irritation of eyelid 08/20/2014  . Dupuytren's contracture 08/20/2014  . Cough 07/21/2014  . Lumbar stenosis with neurogenic claudication 06/13/2014  . Preop cardiovascular exam 05/28/2014  . S/P deep brain stimulator placement 05/08/2014  . Joint pain 01/22/2014  . Medicare annual wellness visit, initial 01/19/2014  . Advance care planning 01/19/2014  . Parkinson's disease ([place]) 12/13/2013  . PTSD (post-traumatic stress disorder) 06/13/2013  . Erectile dysfunction 06/13/2013  . HLD (hyperlipidemia) 06/13/2013  . Hip pain 06/10/2013  . Pain in joint, shoulder region 06/10/2013  . Right leg swelling 06/03/2013  . Essential hypertension 06/03/2013  . Bradycardia by electrocardiogram 06/03/2013  . Obstructive sleep apnea 03/12/2013  . Spinal stenosis, lumbar region, with neurogenic claudication 12/14/2012   Janna Arch, PT, DPT   03/06/2018, 11:44 AM  [name] MAIN [facility] SERVICES [address] [place], [place], [address] Phone: [phone]   Fax:  [phone]  Name: MIN COLLYMORE MRN: [id number] Date of Birth: [birth year]

## 2018-03-07 ENCOUNTER — Telehealth: Payer: Self-pay | Admitting: Family Medicine

## 2018-03-07 ENCOUNTER — Encounter: Payer: Self-pay | Admitting: Speech Pathology

## 2018-03-07 DIAGNOSIS — G20 Parkinson's disease: Secondary | ICD-10-CM

## 2018-03-07 DIAGNOSIS — W19XXXA Unspecified fall, initial encounter: Secondary | ICD-10-CM

## 2018-03-07 NOTE — Telephone Encounter (Signed)
Spoke to patient's wife by telephone and was advised that patient would like to proceed with HH/PT. Advised [name] that she will hear back from the referral coordinator to get this set up for them.

## 2018-03-07 NOTE — Telephone Encounter (Signed)
Please check with patient/wife.  See if he wants to proceed with [facility].  I can refer if needed.  Thanks.    Note from PT pasted below. ------------  Due to patient's limited carryover into home environment and continued frequency of falling patient would best benefit from home health therapy for safe mobility in the home environment and to decrease falls and immobility

## 2018-03-08 ENCOUNTER — Encounter: Payer: Self-pay | Admitting: Speech Pathology

## 2018-03-08 ENCOUNTER — Ambulatory Visit: Payer: Self-pay

## 2018-03-08 NOTE — Telephone Encounter (Signed)
I put in the referral.  Thanks.  

## 2018-03-09 NOTE — Therapy (Signed)
[place] MAIN [facility] SERVICES [address] [place], [place], [address] Phone: [phone]   Fax:  [phone]  Occupational Therapy Treatment  Patient Details  Name: [name] MRN: [id number] Date of Birth: 1950/07/07 No data recorded  Encounter Date: 03/06/2018  OT End of Session - 03/09/18 2007    Visit Number  30    Number of Visits  48    Date for OT Re-Evaluation  04/13/18    Authorization Type  Visit 6 of 10 for progress reporting period starting 01/22/2018    OT Start Time  0915    OT Stop Time  1001    OT Time Calculation (min)  46 min    Activity Tolerance  Patient tolerated treatment well    Behavior During Therapy  [facility] for tasks assessed/performed       Past Medical History:  Diagnosis Date  . Arthritis   . Bradycardia   . Cancer [facility]) 2013   skin cancer  . Depression    ptsd  . Dysrhythmia    chronic slow heart rate  . GERD (gastroesophageal reflux disease)   . Headache(784.0)    tension headaches non recent  . History of chicken pox   . History of kidney stones    passed  . Hypertension    treated with HCTZ  . Parkinson's disease ([place])    dx'ed 15 years ago  . PTSD (post-traumatic stress disorder)   . Shortness of breath dyspnea   . Sleep apnea    doesn't use C-pap  . Varicose veins     Past Surgical History:  Procedure Laterality Date  . CHOLECYSTECTOMY N/A 10/22/2014   Procedure: LAPAROSCOPIC CHOLECYSTECTOMY WITH INTRAOPERATIVE CHOLANGIOGRAM;  Surgeon: Dia Crawford III, MD;  Location: [facility] ORS;  Service: General;  Laterality: N/A;  . cyst removed      from lip as a child  . LUMBAR LAMINECTOMY/DECOMPRESSION MICRODISCECTOMY Bilateral 12/14/2012   Procedure: Bilateral lumbar three-four, four-five decompressive laminotomy/foraminotomy;  Surgeon: Charlie Pitter, MD;  Location: [place] NEURO ORS;  Service: Neurosurgery;  Laterality: Bilateral;  . PULSE GENERATOR IMPLANT Bilateral 12/13/2013   Procedure: Bilateral  implantable pulse generator placement;  Surgeon: Erline Levine, MD;  Location: [place] NEURO ORS;  Service: Neurosurgery;  Laterality: Bilateral;  Bilateral implantable pulse generator placement  . skin cancer removed     from ears,   12 lft arm  rt leg 15  . SUBTHALAMIC STIMULATOR BATTERY REPLACEMENT Bilateral 07/14/2017   Procedure: BILATERAL IMPLANTED PULSE GENERATOR CHANGE FOR DEEP BRAIN STIMULATOR;  Surgeon: Erline Levine, MD;  Location: [place];  Service: Neurosurgery;  Laterality: Bilateral;  . SUBTHALAMIC STIMULATOR INSERTION Bilateral 12/06/2013   Procedure: SUBTHALAMIC STIMULATOR INSERTION;  Surgeon: Erline Levine, MD;  Location: [place] NEURO ORS;  Service: Neurosurgery;  Laterality: Bilateral;  Bilateral deep brain stimulator placement    There were no vitals filed for this visit.  Subjective Assessment - 03/09/18 2007    Subjective   Patient reports he fell this morning backwards into the shower, his back is hurting some since then.  He was trying to get to the sink to brush his teeth.      Pertinent History  Pt. is a 67 y.o. mlae who has a history of Parkinson's Disease. Pt. has a Deep Brain Stimulator in place. Pt. has a history of Low Back Pain with 2 back surgeries in August 2014, and February 2016. Pt. has a history of multiple frequent falls occurring at a rate of  6x's a week.     Patient Stated Goals  To return home.    Pain Score  4     Pain Location  Back    Pain Onset  Today           Patient seen for focus on LB dressing skills with emphasis on donning and doffing socks, shoes and lacing shoes.   Completed with occasional min assist and cues.  Patient is specific on how he wants his laces to be tied with the  bow in the middle and not to the side. Manipulation of small pegs,  inch in size with cues for prehension patterns, patient drops items frequently with  attempts.  Difficulty has with picking up container and more so with shorter container, gets stuck on the sides.                   OT Education - 03/09/18 2007    Education provided  Yes    Education Details  LB dressing skills    Person(s) Educated  Patient    Methods  Verbal cues;Explanation;Demonstration    Comprehension  Verbal cues required;Verbalized understanding;Returned demonstration          OT Long Term Goals - 03/06/18 0927      OT LONG TERM GOAL #1   Title  Pt. will improve Bilateral UE strength by 1 mm grade to assist with ADLs, and IADLs  (Pended)     Baseline  BUE strength: 4+/5 overall  (Pended)     Time  12  (Pended)     Period  Weeks  (Pended)     Status  Achieved  (Pended)       OT LONG TERM GOAL #2   Title  Pt. will improve grip strength to be able to pour himself a beverage.  (Pended)     Baseline  Pt. continues to be unable to hold and pour a bevrage.  (Pended)     Time  12  (Pended)     Period  Weeks  (Pended)     Status  Achieved  (Pended)       OT LONG TERM GOAL #3   Title  Pt. will improve lateral pinch stength to be able to independently turn a key.   (Pended)     Baseline  Pt. continues to have difficulty holding, and turning  key.  (Pended)     Time  12  (Pended)     Period  Weeks  (Pended)     Status  Achieved  (Pended)       OT LONG TERM GOAL #4   Title  Pt. will improve Right hand [facility] skills to be able to independently grasp and use small objects needed for fixing car parts.  (Pended)     Baseline   45sec. on the 9 hole peg test. 01/19/2018 56 sec. on 9 hole peg test.  (Pended)     Time  12  (Pended)     Period  Weeks  (Pended)     Status  On-going  (Pended)       OT LONG TERM GOAL #5   Title  Pt. will be demonstrate self-feeding with modified independence with minimal spillage.  (Pended)     Baseline  Pt. continues to require assistance, and has alot of spillage.  (Pended)     Period  Weeks  (Pended)     Status  On-going  (Pended)  OT LONG TERM GOAL #6   Title  Pt. will manipulate buttons, and cuff buttons independently.   (Pended)     Baseline   Pt. has difficulty manipulating buttons  (Pended)     Time  12  (Pended)     Period  Weeks  (Pended)     Status  On-going  (Pended)       OT LONG TERM GOAL #7   Title  Pt. will write 1 sentence efficiently with 90% legibility.  (Pended)     Baseline  name only 75% legibility  (Pended)     Time  12  (Pended)     Period  Weeks  (Pended)     Status  On-going  (Pended)       OT LONG TERM GOAL #8   Title  Pt. will improve typing speed, and accuracy to be able to to type emails efficently  (Pended)     Baseline  Typing speed: 6 wpm, Accuracy: 33% with moderate cues for hand placement  (Pended)     Time  12  (Pended)     Period  Weeks  (Pended)     Status  On-going  (Pended)       OT LONG TERM GOAL  #9   Baseline  Pt will transfers in and out of the shower with stand-by assistance  (Pended)     Time  12  (Pended)     Period  Weeks  (Pended)     Status  New  (Pended)       OT LONG TERM GOAL  #10   TITLE  Pt will don and doff pants with min A, PRN  (Pended)     Baseline  Pt requires mod A to don and doff pants  (Pended)     Time  12  (Pended)     Period  Weeks  (Pended)     Status  New  (Pended)       OT LONG TERM GOAL  #11   TITLE  Pt will don and doff socks modified independently using AE, PRN  (Pended)     Baseline  Pt has increased difficulty donning and doffing socks  (Pended)     Time  6  (Pended)     Period  Weeks  (Pended)     Status  New  (Pended)       OT LONG TERM GOAL  #12   TITLE  Pt will independently tie shoes  (Pended)     Baseline  Pt requires assistance to tie shoes  (Pended)     Time  6  (Pended)     Period  Weeks  (Pended)     Status  New  (Pended)             Plan - 03/09/18 2008    Clinical Impression Statement  Patient has some difficulty with completing lower body dressing, he will need to complete from a seated position except for standing to negotiate clothing over hips.  In terms of socks, he was able to demonstrate with  crossed leg method, donned shoes with crossed leg method and then reached down to tie shoes.  He reports he does not like when the tie is crooked and needs to be centered.  Continues to work towards improving engagment in daily activities and increasing independence in self care tasks.     Occupational performance deficits (Please refer to evaluation for details):  ADL's;IADL's    Rehab  Potential  Good    Current Impairments/barriers affecting progress:  Positive Indicators: age, familiy support, motivation. Negative Indicators: Multiple comorbidities, and history of falls.    OT Frequency  2x / week    OT Duration  12 weeks    OT Treatment/Interventions  Self-care/ADL training;Therapeutic exercise;DME and/or AE instruction;Energy conservation;Neuromuscular education;Patient/family education;Therapeutic activities    Consulted and Agree with Plan of Care  Patient       Patient will benefit from skilled therapeutic intervention in order to improve the following deficits and impairments:  Pain, Decreased strength, Decreased coordination, Decreased balance, Impaired UE functional use, Decreased safety awareness, Decreased activity tolerance, Decreased cognition, Impaired flexibility, Impaired vision/preception  Visit Diagnosis: Muscle weakness (generalized)  Other lack of coordination  Unsteadiness on feet    Problem List Patient Active Problem List   Diagnosis Date Noted  . Skin sore 12/01/2017  . Hematoma 10/18/2017  . Accidental fall 10/18/2017  . 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 Radicular pain in right arm 10/09/2016  . GERD (gastroesophageal reflux disease) 07/31/2016  . Trochanteric bursitis 03/03/2016  . Left hand pain 10/30/2015  . Fracture, finger, multiple sites 10/30/2015  . Colon cancer screening 07/23/2015  . Healthcare maintenance 07/23/2015  . Gout 02/18/2015  . Depression 01/06/2015  . Irritation of eyelid 08/20/2014  . Dupuytren's contracture 08/20/2014  .  Cough 07/21/2014  . Lumbar stenosis with neurogenic claudication 06/13/2014  . Preop cardiovascular exam 05/28/2014  . S/P deep brain stimulator placement 05/08/2014  . Joint pain 01/22/2014  . Medicare annual wellness visit, initial 01/19/2014  . Advance care planning 01/19/2014  . Parkinson's disease ([employer]) 12/13/2013  . PTSD (post-traumatic stress disorder) 06/13/2013  . Erectile dysfunction 06/13/2013  . HLD (hyperlipidemia) 06/13/2013  . Hip pain 06/10/2013  . Pain in joint, shoulder region 06/10/2013  . Right leg swelling 06/03/2013  . Essential hypertension 06/03/2013  . Bradycardia by electrocardiogram 06/03/2013  . Obstructive sleep apnea 03/12/2013  . Spinal stenosis, lumbar region, with neurogenic claudication 12/14/2012   Achilles Dunk, OTR/L, CLT  , 03/09/2018, 8:11 PM  [place] MAIN [facility] SERVICES [address] [place], [place], [address] Phone: [phone]   Fax:  [phone]  Name: [name] MRN: [id number] Date of Birth: [birth year]

## 2018-03-12 ENCOUNTER — Encounter: Payer: Self-pay | Admitting: Speech Pathology

## 2018-03-12 ENCOUNTER — Telehealth: Payer: Self-pay | Admitting: Neurology

## 2018-03-12 ENCOUNTER — Encounter: Payer: Self-pay | Admitting: Occupational Therapy

## 2018-03-12 DIAGNOSIS — G20 Parkinson's disease: Secondary | ICD-10-CM

## 2018-03-12 NOTE — Telephone Encounter (Signed)
Referred to home health per Dr. Carles Collet.

## 2018-03-13 ENCOUNTER — Ambulatory Visit: Payer: Self-pay

## 2018-03-13 ENCOUNTER — Encounter: Payer: Self-pay | Admitting: Speech Pathology

## 2018-03-13 ENCOUNTER — Telehealth: Payer: Self-pay | Admitting: Neurology

## 2018-03-13 NOTE — Telephone Encounter (Signed)
Mychart message sent to patient.

## 2018-03-14 ENCOUNTER — Encounter: Payer: Self-pay | Admitting: Speech Pathology

## 2018-03-19 ENCOUNTER — Ambulatory Visit: Payer: 59 | Admitting: Psychology

## 2018-03-19 ENCOUNTER — Encounter: Payer: Self-pay | Admitting: Occupational Therapy

## 2018-03-19 ENCOUNTER — Encounter: Payer: Self-pay | Admitting: Speech Pathology

## 2018-03-20 ENCOUNTER — Encounter: Payer: Self-pay | Admitting: Speech Pathology

## 2018-03-20 ENCOUNTER — Ambulatory Visit: Payer: Self-pay | Admitting: Physical Therapy

## 2018-03-20 ENCOUNTER — Encounter: Payer: Self-pay | Admitting: Occupational Therapy

## 2018-03-21 ENCOUNTER — Encounter: Payer: Self-pay | Admitting: Speech Pathology

## 2018-03-21 ENCOUNTER — Ambulatory Visit: Payer: Self-pay | Admitting: Physical Therapy

## 2018-03-21 ENCOUNTER — Telehealth: Payer: Self-pay

## 2018-03-21 ENCOUNTER — Telehealth: Payer: Self-pay | Admitting: Cardiovascular Disease

## 2018-03-21 DIAGNOSIS — I10 Essential (primary) hypertension: Secondary | ICD-10-CM | POA: Diagnosis not present

## 2018-03-21 DIAGNOSIS — M48062 Spinal stenosis, lumbar region with neurogenic claudication: Secondary | ICD-10-CM | POA: Diagnosis not present

## 2018-03-21 DIAGNOSIS — R1310 Dysphagia, unspecified: Secondary | ICD-10-CM | POA: Diagnosis not present

## 2018-03-21 DIAGNOSIS — G20 Parkinson's disease: Secondary | ICD-10-CM | POA: Diagnosis not present

## 2018-03-21 DIAGNOSIS — F329 Major depressive disorder, single episode, unspecified: Secondary | ICD-10-CM | POA: Diagnosis not present

## 2018-03-21 DIAGNOSIS — F4310 Post-traumatic stress disorder, unspecified: Secondary | ICD-10-CM | POA: Diagnosis not present

## 2018-03-21 DIAGNOSIS — M6281 Muscle weakness (generalized): Secondary | ICD-10-CM | POA: Diagnosis not present

## 2018-03-21 DIAGNOSIS — Z9181 History of falling: Secondary | ICD-10-CM | POA: Diagnosis not present

## 2018-03-21 DIAGNOSIS — M25551 Pain in right hip: Secondary | ICD-10-CM | POA: Diagnosis not present

## 2018-03-21 NOTE — Telephone Encounter (Signed)
Pt c/o BP issue: STAT if pt c/o blurred vision, one-sided weakness or slurred speech  1. What are your last 5 BP readings?  Sitting 150/82 standing 142/80   2. Are you having any other symptoms (ex. Dizziness, headache, blurred vision, passed out)?  No   3. What is your BP issue? Several weeks elevated

## 2018-03-21 NOTE — Telephone Encounter (Signed)
Izora Gala OT with [facility] requesting verbal orders for [place] OT and PT 1 x a wk for 1 wk;   2 x a wk for 1 wk; and 1 x a wk for 3 wks.also requesting a speech therapy eval.

## 2018-03-21 NOTE — Telephone Encounter (Signed)
Spoke with patients wife and she states his blood pressures have been somewhat elevated. She states that when PT has been visiting it has been elevated and she does not have any other readings at this time. Instructed her to please start monitoring his blood pressures and keep a log of those readings so that we can see how they trend. She verbalized understanding and I requested that she give [place] a call back if they continue to run elevated because we may need to make some changes. She states that his heart rates are still low (in the 40's) and he denies any symptoms with the elevated readings. She verbalized understanding of our conversation, instructions to monitor and call us with those readings, and had no further questions at this time.

## 2018-03-22 ENCOUNTER — Encounter: Payer: Self-pay | Admitting: Speech Pathology

## 2018-03-22 ENCOUNTER — Ambulatory Visit: Payer: Self-pay | Admitting: Physical Therapy

## 2018-03-22 ENCOUNTER — Ambulatory Visit: Payer: Self-pay

## 2018-03-22 NOTE — Telephone Encounter (Signed)
Please give the order.  Thanks.   

## 2018-03-22 NOTE — Telephone Encounter (Signed)
Left detailed message on voicemail.  

## 2018-03-26 ENCOUNTER — Ambulatory Visit: Payer: Self-pay | Admitting: Physical Therapy

## 2018-03-26 ENCOUNTER — Encounter: Payer: Self-pay | Admitting: Speech Pathology

## 2018-03-26 DIAGNOSIS — F4310 Post-traumatic stress disorder, unspecified: Secondary | ICD-10-CM | POA: Diagnosis not present

## 2018-03-26 DIAGNOSIS — I10 Essential (primary) hypertension: Secondary | ICD-10-CM | POA: Diagnosis not present

## 2018-03-26 DIAGNOSIS — M6281 Muscle weakness (generalized): Secondary | ICD-10-CM | POA: Diagnosis not present

## 2018-03-26 DIAGNOSIS — F329 Major depressive disorder, single episode, unspecified: Secondary | ICD-10-CM | POA: Diagnosis not present

## 2018-03-26 DIAGNOSIS — M25551 Pain in right hip: Secondary | ICD-10-CM | POA: Diagnosis not present

## 2018-03-26 DIAGNOSIS — Z9181 History of falling: Secondary | ICD-10-CM | POA: Diagnosis not present

## 2018-03-26 DIAGNOSIS — G20 Parkinson's disease: Secondary | ICD-10-CM | POA: Diagnosis not present

## 2018-03-26 DIAGNOSIS — R1310 Dysphagia, unspecified: Secondary | ICD-10-CM | POA: Diagnosis not present

## 2018-03-26 DIAGNOSIS — M48062 Spinal stenosis, lumbar region with neurogenic claudication: Secondary | ICD-10-CM | POA: Diagnosis not present

## 2018-03-27 ENCOUNTER — Telehealth: Payer: Self-pay | Admitting: *Deleted

## 2018-03-27 ENCOUNTER — Ambulatory Visit: Payer: Self-pay

## 2018-03-27 DIAGNOSIS — I10 Essential (primary) hypertension: Secondary | ICD-10-CM | POA: Diagnosis not present

## 2018-03-27 DIAGNOSIS — M25551 Pain in right hip: Secondary | ICD-10-CM | POA: Diagnosis not present

## 2018-03-27 DIAGNOSIS — M6281 Muscle weakness (generalized): Secondary | ICD-10-CM | POA: Diagnosis not present

## 2018-03-27 DIAGNOSIS — M48062 Spinal stenosis, lumbar region with neurogenic claudication: Secondary | ICD-10-CM | POA: Diagnosis not present

## 2018-03-27 DIAGNOSIS — R1310 Dysphagia, unspecified: Secondary | ICD-10-CM | POA: Diagnosis not present

## 2018-03-27 DIAGNOSIS — F4310 Post-traumatic stress disorder, unspecified: Secondary | ICD-10-CM | POA: Diagnosis not present

## 2018-03-27 DIAGNOSIS — F329 Major depressive disorder, single episode, unspecified: Secondary | ICD-10-CM | POA: Diagnosis not present

## 2018-03-27 DIAGNOSIS — Z9181 History of falling: Secondary | ICD-10-CM | POA: Diagnosis not present

## 2018-03-27 DIAGNOSIS — G20 Parkinson's disease: Secondary | ICD-10-CM | POA: Diagnosis not present

## 2018-03-27 NOTE — Telephone Encounter (Signed)
Please give the order.  His pulse is usually 40-60 historically.  Change the call threshold re: pulse- if pulse is <40 then notify MD. Thanks.

## 2018-03-27 NOTE — Telephone Encounter (Signed)
[name] with [facility] advised 

## 2018-03-27 NOTE — Telephone Encounter (Signed)
Spoke with [name] at [facility] who is requesting verbal for home health speech therapy 3x/wk for 1wk, 2x/wk for 1wk. She also reports that his heart rate was 48, but was advised this was normal for him. It is their policy to call if pts heart rate is <60, but if Dr Damita Dunnings is wanting to change the norm for this pts eval, she may do so, so that you dont receive a call each time its below 60. pls advise

## 2018-03-28 ENCOUNTER — Ambulatory Visit: Payer: Self-pay

## 2018-03-28 ENCOUNTER — Encounter: Payer: Self-pay | Admitting: Occupational Therapy

## 2018-03-28 DIAGNOSIS — R1310 Dysphagia, unspecified: Secondary | ICD-10-CM | POA: Diagnosis not present

## 2018-03-28 DIAGNOSIS — M6281 Muscle weakness (generalized): Secondary | ICD-10-CM | POA: Diagnosis not present

## 2018-03-28 DIAGNOSIS — I10 Essential (primary) hypertension: Secondary | ICD-10-CM | POA: Diagnosis not present

## 2018-03-28 DIAGNOSIS — M48062 Spinal stenosis, lumbar region with neurogenic claudication: Secondary | ICD-10-CM | POA: Diagnosis not present

## 2018-03-28 DIAGNOSIS — Z9181 History of falling: Secondary | ICD-10-CM | POA: Diagnosis not present

## 2018-03-28 DIAGNOSIS — M25551 Pain in right hip: Secondary | ICD-10-CM | POA: Diagnosis not present

## 2018-03-28 DIAGNOSIS — F4310 Post-traumatic stress disorder, unspecified: Secondary | ICD-10-CM | POA: Diagnosis not present

## 2018-03-28 DIAGNOSIS — F329 Major depressive disorder, single episode, unspecified: Secondary | ICD-10-CM | POA: Diagnosis not present

## 2018-03-28 DIAGNOSIS — G20 Parkinson's disease: Secondary | ICD-10-CM | POA: Diagnosis not present

## 2018-03-30 DIAGNOSIS — M48062 Spinal stenosis, lumbar region with neurogenic claudication: Secondary | ICD-10-CM | POA: Diagnosis not present

## 2018-03-30 DIAGNOSIS — F4310 Post-traumatic stress disorder, unspecified: Secondary | ICD-10-CM | POA: Diagnosis not present

## 2018-03-30 DIAGNOSIS — I10 Essential (primary) hypertension: Secondary | ICD-10-CM | POA: Diagnosis not present

## 2018-03-30 DIAGNOSIS — M25551 Pain in right hip: Secondary | ICD-10-CM | POA: Diagnosis not present

## 2018-03-30 DIAGNOSIS — G20 Parkinson's disease: Secondary | ICD-10-CM | POA: Diagnosis not present

## 2018-03-30 DIAGNOSIS — Z9181 History of falling: Secondary | ICD-10-CM | POA: Diagnosis not present

## 2018-03-30 DIAGNOSIS — R1310 Dysphagia, unspecified: Secondary | ICD-10-CM | POA: Diagnosis not present

## 2018-03-30 DIAGNOSIS — M6281 Muscle weakness (generalized): Secondary | ICD-10-CM | POA: Diagnosis not present

## 2018-03-30 DIAGNOSIS — F329 Major depressive disorder, single episode, unspecified: Secondary | ICD-10-CM | POA: Diagnosis not present

## 2018-04-02 ENCOUNTER — Telehealth: Payer: Self-pay | Admitting: *Deleted

## 2018-04-02 ENCOUNTER — Encounter: Payer: Self-pay | Admitting: Occupational Therapy

## 2018-04-02 DIAGNOSIS — M25551 Pain in right hip: Secondary | ICD-10-CM | POA: Diagnosis not present

## 2018-04-02 DIAGNOSIS — M48062 Spinal stenosis, lumbar region with neurogenic claudication: Secondary | ICD-10-CM | POA: Diagnosis not present

## 2018-04-02 DIAGNOSIS — F4310 Post-traumatic stress disorder, unspecified: Secondary | ICD-10-CM | POA: Diagnosis not present

## 2018-04-02 DIAGNOSIS — I10 Essential (primary) hypertension: Secondary | ICD-10-CM | POA: Diagnosis not present

## 2018-04-02 DIAGNOSIS — G20 Parkinson's disease: Secondary | ICD-10-CM | POA: Diagnosis not present

## 2018-04-02 DIAGNOSIS — R1310 Dysphagia, unspecified: Secondary | ICD-10-CM | POA: Diagnosis not present

## 2018-04-02 DIAGNOSIS — M6281 Muscle weakness (generalized): Secondary | ICD-10-CM | POA: Diagnosis not present

## 2018-04-02 DIAGNOSIS — F329 Major depressive disorder, single episode, unspecified: Secondary | ICD-10-CM | POA: Diagnosis not present

## 2018-04-02 DIAGNOSIS — Z9181 History of falling: Secondary | ICD-10-CM | POA: Diagnosis not present

## 2018-04-02 NOTE — Telephone Encounter (Signed)
Left detailed message on voicemail of [name] for speech therapy.

## 2018-04-02 NOTE — Telephone Encounter (Signed)
Spoke to [name], speech therapist with [facility], who states she had to cancel one visit with pt, due to scheduling conflicts. She is requesting to add a visit to her end of her plan of care. pls advise

## 2018-04-02 NOTE — Telephone Encounter (Signed)
Please give the order.  Thanks.   

## 2018-04-03 ENCOUNTER — Ambulatory Visit: Payer: Self-pay

## 2018-04-03 DIAGNOSIS — G4733 Obstructive sleep apnea (adult) (pediatric): Secondary | ICD-10-CM | POA: Diagnosis not present

## 2018-04-03 DIAGNOSIS — M48062 Spinal stenosis, lumbar region with neurogenic claudication: Secondary | ICD-10-CM | POA: Diagnosis not present

## 2018-04-03 DIAGNOSIS — I10 Essential (primary) hypertension: Secondary | ICD-10-CM | POA: Diagnosis not present

## 2018-04-03 DIAGNOSIS — F329 Major depressive disorder, single episode, unspecified: Secondary | ICD-10-CM | POA: Diagnosis not present

## 2018-04-03 DIAGNOSIS — F4310 Post-traumatic stress disorder, unspecified: Secondary | ICD-10-CM | POA: Diagnosis not present

## 2018-04-03 DIAGNOSIS — M25551 Pain in right hip: Secondary | ICD-10-CM | POA: Diagnosis not present

## 2018-04-03 DIAGNOSIS — Z9181 History of falling: Secondary | ICD-10-CM | POA: Diagnosis not present

## 2018-04-03 DIAGNOSIS — M6281 Muscle weakness (generalized): Secondary | ICD-10-CM | POA: Diagnosis not present

## 2018-04-03 DIAGNOSIS — G20 Parkinson's disease: Secondary | ICD-10-CM | POA: Diagnosis not present

## 2018-04-03 DIAGNOSIS — R1310 Dysphagia, unspecified: Secondary | ICD-10-CM | POA: Diagnosis not present

## 2018-04-04 ENCOUNTER — Encounter: Payer: Self-pay | Admitting: Occupational Therapy

## 2018-04-05 DIAGNOSIS — M48062 Spinal stenosis, lumbar region with neurogenic claudication: Secondary | ICD-10-CM | POA: Diagnosis not present

## 2018-04-05 DIAGNOSIS — F329 Major depressive disorder, single episode, unspecified: Secondary | ICD-10-CM | POA: Diagnosis not present

## 2018-04-05 DIAGNOSIS — G20 Parkinson's disease: Secondary | ICD-10-CM | POA: Diagnosis not present

## 2018-04-05 DIAGNOSIS — F4310 Post-traumatic stress disorder, unspecified: Secondary | ICD-10-CM | POA: Diagnosis not present

## 2018-04-05 DIAGNOSIS — I10 Essential (primary) hypertension: Secondary | ICD-10-CM | POA: Diagnosis not present

## 2018-04-05 DIAGNOSIS — R1310 Dysphagia, unspecified: Secondary | ICD-10-CM | POA: Diagnosis not present

## 2018-04-05 DIAGNOSIS — M25551 Pain in right hip: Secondary | ICD-10-CM | POA: Diagnosis not present

## 2018-04-05 DIAGNOSIS — Z9181 History of falling: Secondary | ICD-10-CM | POA: Diagnosis not present

## 2018-04-05 DIAGNOSIS — M6281 Muscle weakness (generalized): Secondary | ICD-10-CM | POA: Diagnosis not present

## 2018-04-09 ENCOUNTER — Telehealth: Payer: Self-pay

## 2018-04-09 ENCOUNTER — Telehealth: Payer: Self-pay | Admitting: *Deleted

## 2018-04-09 DIAGNOSIS — I10 Essential (primary) hypertension: Secondary | ICD-10-CM | POA: Diagnosis not present

## 2018-04-09 DIAGNOSIS — Z9181 History of falling: Secondary | ICD-10-CM | POA: Diagnosis not present

## 2018-04-09 DIAGNOSIS — F4310 Post-traumatic stress disorder, unspecified: Secondary | ICD-10-CM | POA: Diagnosis not present

## 2018-04-09 DIAGNOSIS — M6281 Muscle weakness (generalized): Secondary | ICD-10-CM | POA: Diagnosis not present

## 2018-04-09 DIAGNOSIS — F329 Major depressive disorder, single episode, unspecified: Secondary | ICD-10-CM | POA: Diagnosis not present

## 2018-04-09 DIAGNOSIS — M25551 Pain in right hip: Secondary | ICD-10-CM | POA: Diagnosis not present

## 2018-04-09 DIAGNOSIS — G20 Parkinson's disease: Secondary | ICD-10-CM | POA: Diagnosis not present

## 2018-04-09 DIAGNOSIS — R1310 Dysphagia, unspecified: Secondary | ICD-10-CM | POA: Diagnosis not present

## 2018-04-09 DIAGNOSIS — M48062 Spinal stenosis, lumbar region with neurogenic claudication: Secondary | ICD-10-CM | POA: Diagnosis not present

## 2018-04-09 NOTE — Telephone Encounter (Signed)
[name] MCkee speech therapist with [facility] was out seeing pt; pts BP elevated today; 172/82 LA and RA P 46 P is normal for pt. Reck BP 170/88.  04/05/18 BP was 140/86. PT took BP earlier today and was elevated but not sure of reading. Pt is not on BP medication. NO H/A, dizziness,CP ro SOB. Pt is not stressed and does not appear in any distress. Pt is w/c bound. Dr Damita Dunnings said to monitor BP once a day and does not want to start BP med at this time. [facility] to cb if BP does not improve or BP continues to rise. [name] voiced understanding. FYI to Dr Damita Dunnings.

## 2018-04-09 NOTE — Telephone Encounter (Signed)
Spoke to [name] speech therapist with [facility] who is requesting verbal orders continue hh speech therapy 1x/wk for 1wk then 2x/wk for 2wks.

## 2018-04-09 NOTE — Telephone Encounter (Signed)
Please give the order.  Thanks.   

## 2018-04-09 NOTE — Telephone Encounter (Signed)
Given his relatively recent blood pressure of 140/86, I would not change his medication.  Patient has a history of falls.  He has Parkinson's.  He has a history of bradycardia in spite of not being on any medications to slow down his heart rate.  I do not want to induce hypotension.  Thanks.

## 2018-04-10 NOTE — Telephone Encounter (Signed)
Verbal order given to [name] as instructed by telephone and verbalized understanding.

## 2018-04-12 DIAGNOSIS — Z9181 History of falling: Secondary | ICD-10-CM | POA: Diagnosis not present

## 2018-04-12 DIAGNOSIS — F329 Major depressive disorder, single episode, unspecified: Secondary | ICD-10-CM | POA: Diagnosis not present

## 2018-04-12 DIAGNOSIS — M48062 Spinal stenosis, lumbar region with neurogenic claudication: Secondary | ICD-10-CM | POA: Diagnosis not present

## 2018-04-12 DIAGNOSIS — M25551 Pain in right hip: Secondary | ICD-10-CM | POA: Diagnosis not present

## 2018-04-12 DIAGNOSIS — F4310 Post-traumatic stress disorder, unspecified: Secondary | ICD-10-CM | POA: Diagnosis not present

## 2018-04-12 DIAGNOSIS — M6281 Muscle weakness (generalized): Secondary | ICD-10-CM | POA: Diagnosis not present

## 2018-04-12 DIAGNOSIS — I10 Essential (primary) hypertension: Secondary | ICD-10-CM | POA: Diagnosis not present

## 2018-04-12 DIAGNOSIS — G20 Parkinson's disease: Secondary | ICD-10-CM | POA: Diagnosis not present

## 2018-04-12 DIAGNOSIS — R1310 Dysphagia, unspecified: Secondary | ICD-10-CM | POA: Diagnosis not present

## 2018-04-13 ENCOUNTER — Encounter: Payer: Self-pay | Admitting: Occupational Therapy

## 2018-04-13 NOTE — Telephone Encounter (Signed)
Spoke to pts wife and advised of message per Dr Damita Dunnings. Wife states that BP was 171/94, and denies any additional symptoms. Wife is wanting to ensure that Dr Josefine Class plan is still the same from this previous message. She can be reached at [phone]

## 2018-04-16 ENCOUNTER — Telehealth: Payer: Self-pay | Admitting: *Deleted

## 2018-04-16 ENCOUNTER — Telehealth: Payer: Self-pay | Admitting: Cardiovascular Disease

## 2018-04-16 ENCOUNTER — Ambulatory Visit (INDEPENDENT_AMBULATORY_CARE_PROVIDER_SITE_OTHER): Payer: Medicare PPO | Admitting: Psychology

## 2018-04-16 DIAGNOSIS — F4323 Adjustment disorder with mixed anxiety and depressed mood: Secondary | ICD-10-CM | POA: Diagnosis not present

## 2018-04-16 DIAGNOSIS — F331 Major depressive disorder, recurrent, moderate: Secondary | ICD-10-CM

## 2018-04-16 NOTE — Telephone Encounter (Signed)
Pt c/o BP issue: STAT if pt c/o blurred vision, one-sided weakness or slurred speech  1. What are your last 5 BP readings?     165/92    175/95    166/87    159/115    167/85    139/83    169/70    174/85   2. Are you having any other symptoms (ex. Dizziness, headache, blurred vision, passed out)? Headache   3. What is your BP issue? Elevated bp but low pulse 40's - 50's

## 2018-04-16 NOTE — Telephone Encounter (Signed)
plz have wife monitor BPs over next several days then call us at end of week with log of readings to get more measurements to help determine best next steps.

## 2018-04-16 NOTE — Telephone Encounter (Signed)
Spoke to [name] who states she had to cancel her visit with pt on Friday, due to personal sickness and is requesting to add that visit to the end of point of care. pls advise

## 2018-04-16 NOTE — Telephone Encounter (Signed)
Wife reports that HTN has been ongoing since early dec when they called. See telephone note.  She reports that is changed on sat when he began having and headache and blurry vision. He was seen by [facility] ED on sat night. Was normotensive at that time. Was given Toradol and headache improved.  Headache is better for the most part but continues to have HTN and blurry vision.   Sending call to [facility] to advise. He is currently on the call list for cancellation so that appt can possibly be moved up.  Advised pt to call for worsening sx, any further questions or concerns.

## 2018-04-17 ENCOUNTER — Telehealth: Payer: Self-pay | Admitting: Family Medicine

## 2018-04-17 ENCOUNTER — Ambulatory Visit: Payer: Self-pay

## 2018-04-17 DIAGNOSIS — F4310 Post-traumatic stress disorder, unspecified: Secondary | ICD-10-CM | POA: Diagnosis not present

## 2018-04-17 DIAGNOSIS — M48062 Spinal stenosis, lumbar region with neurogenic claudication: Secondary | ICD-10-CM | POA: Diagnosis not present

## 2018-04-17 DIAGNOSIS — R1310 Dysphagia, unspecified: Secondary | ICD-10-CM | POA: Diagnosis not present

## 2018-04-17 DIAGNOSIS — M6281 Muscle weakness (generalized): Secondary | ICD-10-CM | POA: Diagnosis not present

## 2018-04-17 DIAGNOSIS — Z9181 History of falling: Secondary | ICD-10-CM | POA: Diagnosis not present

## 2018-04-17 DIAGNOSIS — M25551 Pain in right hip: Secondary | ICD-10-CM | POA: Diagnosis not present

## 2018-04-17 DIAGNOSIS — I10 Essential (primary) hypertension: Secondary | ICD-10-CM | POA: Diagnosis not present

## 2018-04-17 DIAGNOSIS — F329 Major depressive disorder, single episode, unspecified: Secondary | ICD-10-CM | POA: Diagnosis not present

## 2018-04-17 DIAGNOSIS — G20 Parkinson's disease: Secondary | ICD-10-CM | POA: Diagnosis not present

## 2018-04-17 NOTE — Telephone Encounter (Signed)
Copied from [place] [phone]. Topic: General - Inquiry >> Apr 17, 2018  1:53 PM Scherrie Gerlach wrote: Reason for CRM: [name] with [facility] calling to report pt's bp today is 162/90.  She reports it was elevated as well a few days ago. Pt has appt on 1/06 with the dr.  Warren Lacy will see him later this week and call back if any higher, but wants to ensure the dr addresses this issue on Monday  Cb [phone]

## 2018-04-17 NOTE — Telephone Encounter (Signed)
Spoke with the wife who states she has taken him to the [place] ER for his headache.  They gave him Toradol and it eased the headache some but he still has it.  Wife asks for an appointment which was scheduled on Monday.  Do you need a 30 min visit or does he need to be worked in before Monday?

## 2018-04-18 NOTE — Telephone Encounter (Signed)
Recommend we try losartan 50 mg daily Would continue to monitor blood pressure several hours after taking the medication

## 2018-04-18 NOTE — Telephone Encounter (Signed)
Please give the order.  Thanks.   

## 2018-04-18 NOTE — Telephone Encounter (Signed)
He may or may not need follow-up sooner than scheduled.  It really depends on his level of symptoms.  If he is getting worse in the meantime then we can try to add him on sooner.  A 30-minute visit would be good either way.  If needed we can add him on at 4:00 on Friday afternoon.  Thanks.

## 2018-04-19 ENCOUNTER — Telehealth: Payer: Self-pay | Admitting: Cardiovascular Disease

## 2018-04-19 DIAGNOSIS — M25551 Pain in right hip: Secondary | ICD-10-CM | POA: Diagnosis not present

## 2018-04-19 DIAGNOSIS — R1310 Dysphagia, unspecified: Secondary | ICD-10-CM | POA: Diagnosis not present

## 2018-04-19 DIAGNOSIS — F4310 Post-traumatic stress disorder, unspecified: Secondary | ICD-10-CM | POA: Diagnosis not present

## 2018-04-19 DIAGNOSIS — M6281 Muscle weakness (generalized): Secondary | ICD-10-CM | POA: Diagnosis not present

## 2018-04-19 DIAGNOSIS — M48062 Spinal stenosis, lumbar region with neurogenic claudication: Secondary | ICD-10-CM | POA: Diagnosis not present

## 2018-04-19 DIAGNOSIS — F329 Major depressive disorder, single episode, unspecified: Secondary | ICD-10-CM | POA: Diagnosis not present

## 2018-04-19 DIAGNOSIS — G20 Parkinson's disease: Secondary | ICD-10-CM | POA: Diagnosis not present

## 2018-04-19 DIAGNOSIS — Z9181 History of falling: Secondary | ICD-10-CM | POA: Diagnosis not present

## 2018-04-19 DIAGNOSIS — I10 Essential (primary) hypertension: Secondary | ICD-10-CM | POA: Diagnosis not present

## 2018-04-19 MED ORDER — LOSARTAN POTASSIUM 50 MG PO TABS: 50.0000 mg | ORAL_TABLET | Freq: Every day | ORAL | 2 refills | Status: DC

## 2018-04-19 NOTE — Telephone Encounter (Signed)
Per pts chart pt has 30' appt on 04/20/18 at 4PM with Dr Damita Dunnings that was scheduled today per OK from [place].

## 2018-04-19 NOTE — Telephone Encounter (Signed)
Pt c/o BP issue: STAT if pt c/o blurred vision, one-sided weakness or slurred speech  1. What are your last 5 BP readings?  Today: 156/80, 162/88, 184/86, 174/101,   2. Are you having any other symptoms (ex. Dizziness, headache, blurred vision, passed out)? Headache, blurred vision  3. What is your BP issue? elevated

## 2018-04-19 NOTE — Telephone Encounter (Signed)
Added at 4 pm on Friday.

## 2018-04-19 NOTE — Telephone Encounter (Signed)
Left detailed message on voicemail of [name] with [facility],

## 2018-04-19 NOTE — Telephone Encounter (Signed)
[place] Clack OT with [facility] left v/m requesting verbal order for an added visit next week 1 x a wk x 1 wk next wk for missed visit today due to elevated BP.  Izora Gala said PT was there when [place] got there and Izora Gala did not see pt. Izora Gala was advised by PT that sitting BP was 180/86 and when laying down BP 160/80. Izora Gala thought pt was seen in ED this past weekend and given med but pt still has H/A.  Pt already has 30' appt scheduled 04/20/18 at 4 PM.

## 2018-04-19 NOTE — Telephone Encounter (Signed)
Call to patient, spoke with wife. Okay per DPR.   She verbalized understanding of new POC.  Will pick up medication and start today, she agrees to take BP a few after hours after initiation to ensure no dips in BP.  She agrees to keep scheduled follow up. Advised pt to call for any further questions or concerns

## 2018-04-19 NOTE — Telephone Encounter (Signed)
Wife calling to report BP still elevated today despite taking new medication at 2:30 pm.  I asked wife to give the medication more time to see results as he just started the medication today at 2:30pm. She verbalized understanding and reported that she will monitor sx of worsening headache and blurred vision. She agrees to take BP 1 hr after medication administration tomorrow and will see where he stands.  Advised pt to call for any further questions or concerns

## 2018-04-20 ENCOUNTER — Ambulatory Visit: Payer: Medicare PPO | Admitting: Family Medicine

## 2018-04-20 ENCOUNTER — Encounter: Payer: Self-pay | Admitting: Occupational Therapy

## 2018-04-20 ENCOUNTER — Telehealth: Payer: Self-pay | Admitting: *Deleted

## 2018-04-20 DIAGNOSIS — M25551 Pain in right hip: Secondary | ICD-10-CM | POA: Diagnosis not present

## 2018-04-20 DIAGNOSIS — F4310 Post-traumatic stress disorder, unspecified: Secondary | ICD-10-CM | POA: Diagnosis not present

## 2018-04-20 DIAGNOSIS — I10 Essential (primary) hypertension: Secondary | ICD-10-CM | POA: Diagnosis not present

## 2018-04-20 DIAGNOSIS — M6281 Muscle weakness (generalized): Secondary | ICD-10-CM | POA: Diagnosis not present

## 2018-04-20 DIAGNOSIS — G20 Parkinson's disease: Secondary | ICD-10-CM | POA: Diagnosis not present

## 2018-04-20 DIAGNOSIS — M48062 Spinal stenosis, lumbar region with neurogenic claudication: Secondary | ICD-10-CM | POA: Diagnosis not present

## 2018-04-20 DIAGNOSIS — Z9181 History of falling: Secondary | ICD-10-CM | POA: Diagnosis not present

## 2018-04-20 DIAGNOSIS — R1310 Dysphagia, unspecified: Secondary | ICD-10-CM | POA: Diagnosis not present

## 2018-04-20 DIAGNOSIS — F329 Major depressive disorder, single episode, unspecified: Secondary | ICD-10-CM | POA: Diagnosis not present

## 2018-04-20 NOTE — Telephone Encounter (Signed)
Please give the order.  Thanks.  I will see him about his blood pressure today.

## 2018-04-20 NOTE — Telephone Encounter (Signed)
Patient's wife phoned in saying that they will have to cancel the 4:00 pm appointment today because patient is wheelchair bound.  Wife asks to let Dr. Damita Dunnings know that his BP was normal today and that the Cardiologist phoned in some BP medication.

## 2018-04-20 NOTE — Telephone Encounter (Signed)
Izora Gala with OT with [facility] advised.

## 2018-04-22 NOTE — Telephone Encounter (Signed)
Noted. Thanks. I'll defer.  

## 2018-04-23 ENCOUNTER — Ambulatory Visit: Payer: Self-pay | Admitting: Family Medicine

## 2018-04-24 ENCOUNTER — Telehealth: Payer: Self-pay | Admitting: *Deleted

## 2018-04-24 DIAGNOSIS — M25551 Pain in right hip: Secondary | ICD-10-CM | POA: Diagnosis not present

## 2018-04-24 DIAGNOSIS — G20 Parkinson's disease: Secondary | ICD-10-CM | POA: Diagnosis not present

## 2018-04-24 DIAGNOSIS — F4310 Post-traumatic stress disorder, unspecified: Secondary | ICD-10-CM | POA: Diagnosis not present

## 2018-04-24 DIAGNOSIS — Z9181 History of falling: Secondary | ICD-10-CM | POA: Diagnosis not present

## 2018-04-24 DIAGNOSIS — M48062 Spinal stenosis, lumbar region with neurogenic claudication: Secondary | ICD-10-CM | POA: Diagnosis not present

## 2018-04-24 DIAGNOSIS — R1310 Dysphagia, unspecified: Secondary | ICD-10-CM | POA: Diagnosis not present

## 2018-04-24 DIAGNOSIS — I10 Essential (primary) hypertension: Secondary | ICD-10-CM | POA: Diagnosis not present

## 2018-04-24 DIAGNOSIS — M6281 Muscle weakness (generalized): Secondary | ICD-10-CM | POA: Diagnosis not present

## 2018-04-24 DIAGNOSIS — F329 Major depressive disorder, single episode, unspecified: Secondary | ICD-10-CM | POA: Diagnosis not present

## 2018-04-24 NOTE — Telephone Encounter (Signed)
Spoke to [place] with [facility] who states pt fell Monday 04/23/2018 appx 3am. Pt reports and demonstrates no injuries per Izora Gala. She also states they will be d/c pt from OT today with 2 goals discontinued.

## 2018-04-25 NOTE — Telephone Encounter (Signed)
Noted.  Thanks. Will defer given lack on injury.  He has had PT for fall risk reduction.

## 2018-04-26 DIAGNOSIS — R498 Other voice and resonance disorders: Secondary | ICD-10-CM | POA: Diagnosis not present

## 2018-04-26 DIAGNOSIS — Z9689 Presence of other specified functional implants: Secondary | ICD-10-CM | POA: Diagnosis not present

## 2018-04-26 DIAGNOSIS — R2689 Other abnormalities of gait and mobility: Secondary | ICD-10-CM | POA: Diagnosis not present

## 2018-04-26 DIAGNOSIS — R1319 Other dysphagia: Secondary | ICD-10-CM | POA: Diagnosis not present

## 2018-04-26 DIAGNOSIS — G20 Parkinson's disease: Secondary | ICD-10-CM | POA: Diagnosis not present

## 2018-04-26 DIAGNOSIS — R471 Dysarthria and anarthria: Secondary | ICD-10-CM | POA: Diagnosis not present

## 2018-04-27 ENCOUNTER — Telehealth: Payer: Self-pay | Admitting: *Deleted

## 2018-04-27 DIAGNOSIS — G20 Parkinson's disease: Secondary | ICD-10-CM | POA: Diagnosis not present

## 2018-04-27 DIAGNOSIS — H532 Diplopia: Secondary | ICD-10-CM | POA: Diagnosis not present

## 2018-04-27 NOTE — Telephone Encounter (Signed)
Please give the order.  Thanks.   

## 2018-04-27 NOTE — Telephone Encounter (Signed)
[name] a  Speech Therapist with [place] called stating that she had an order to see patient today and his wife called and cancelled the appointment. [name] stated that they had to cancel because patient had an appointment at the [facility]. [name] wants a verbal okay to move the appointment to next week.

## 2018-04-27 NOTE — Telephone Encounter (Signed)
I called [name] at [phone] and gave verbal order below per Dr Damita Dunnings.

## 2018-04-28 DIAGNOSIS — L299 Pruritus, unspecified: Secondary | ICD-10-CM | POA: Diagnosis not present

## 2018-04-30 ENCOUNTER — Telehealth: Payer: Self-pay

## 2018-04-30 ENCOUNTER — Ambulatory Visit (INDEPENDENT_AMBULATORY_CARE_PROVIDER_SITE_OTHER): Payer: Medicare PPO | Admitting: Psychology

## 2018-04-30 DIAGNOSIS — F331 Major depressive disorder, recurrent, moderate: Secondary | ICD-10-CM

## 2018-04-30 DIAGNOSIS — R1310 Dysphagia, unspecified: Secondary | ICD-10-CM | POA: Diagnosis not present

## 2018-04-30 DIAGNOSIS — F4323 Adjustment disorder with mixed anxiety and depressed mood: Secondary | ICD-10-CM

## 2018-04-30 DIAGNOSIS — F4310 Post-traumatic stress disorder, unspecified: Secondary | ICD-10-CM | POA: Diagnosis not present

## 2018-04-30 DIAGNOSIS — M6281 Muscle weakness (generalized): Secondary | ICD-10-CM | POA: Diagnosis not present

## 2018-04-30 DIAGNOSIS — M25551 Pain in right hip: Secondary | ICD-10-CM | POA: Diagnosis not present

## 2018-04-30 DIAGNOSIS — G20 Parkinson's disease: Secondary | ICD-10-CM | POA: Diagnosis not present

## 2018-04-30 DIAGNOSIS — F329 Major depressive disorder, single episode, unspecified: Secondary | ICD-10-CM | POA: Diagnosis not present

## 2018-04-30 DIAGNOSIS — I10 Essential (primary) hypertension: Secondary | ICD-10-CM | POA: Diagnosis not present

## 2018-04-30 DIAGNOSIS — M48062 Spinal stenosis, lumbar region with neurogenic claudication: Secondary | ICD-10-CM | POA: Diagnosis not present

## 2018-04-30 DIAGNOSIS — Z9181 History of falling: Secondary | ICD-10-CM | POA: Diagnosis not present

## 2018-04-30 MED ORDER — LOSARTAN POTASSIUM 50 MG PO TABS: 50.0000 mg | ORAL_TABLET | Freq: Every day | ORAL | Status: DC

## 2018-04-30 NOTE — Telephone Encounter (Signed)
I would keep taking 50mg  a day for now if he is feeling well.  I don't want to drop his BP too much, too quickly.  If he has BP elevation >423 systolic or >53 diastolic (or both) with a headache, then I would take an extra 25mg  of losartan that day for a total dose of 75mg .  Thanks.

## 2018-04-30 NOTE — Telephone Encounter (Signed)
[name] (wife) advised.

## 2018-04-30 NOTE — Telephone Encounter (Signed)
[name] (DPR signed) said pt woke up this morning with H/A; pain level 3. Pt took losartan 50 mg 1 1/2 hrs ago and now BP 152/87. [name] is concerned about elevated BP and H/A. Pt started taking losartan 50 mg one daily on 04/19/18 and BP averaging 120-130/65 - 80 since taking losartan until today. No dizziness, CP or SOB. [name] request cb. [facility].

## 2018-05-02 ENCOUNTER — Encounter: Payer: Self-pay | Admitting: Family Medicine

## 2018-05-04 ENCOUNTER — Telehealth: Payer: Self-pay | Admitting: Family Medicine

## 2018-05-04 DIAGNOSIS — Z9181 History of falling: Secondary | ICD-10-CM | POA: Diagnosis not present

## 2018-05-04 DIAGNOSIS — M25551 Pain in right hip: Secondary | ICD-10-CM | POA: Diagnosis not present

## 2018-05-04 DIAGNOSIS — I10 Essential (primary) hypertension: Secondary | ICD-10-CM | POA: Diagnosis not present

## 2018-05-04 DIAGNOSIS — G4733 Obstructive sleep apnea (adult) (pediatric): Secondary | ICD-10-CM | POA: Diagnosis not present

## 2018-05-04 DIAGNOSIS — G20 Parkinson's disease: Secondary | ICD-10-CM | POA: Diagnosis not present

## 2018-05-04 DIAGNOSIS — R1310 Dysphagia, unspecified: Secondary | ICD-10-CM | POA: Diagnosis not present

## 2018-05-04 DIAGNOSIS — M6281 Muscle weakness (generalized): Secondary | ICD-10-CM | POA: Diagnosis not present

## 2018-05-04 DIAGNOSIS — F329 Major depressive disorder, single episode, unspecified: Secondary | ICD-10-CM | POA: Diagnosis not present

## 2018-05-04 DIAGNOSIS — F4310 Post-traumatic stress disorder, unspecified: Secondary | ICD-10-CM | POA: Diagnosis not present

## 2018-05-04 DIAGNOSIS — M48062 Spinal stenosis, lumbar region with neurogenic claudication: Secondary | ICD-10-CM | POA: Diagnosis not present

## 2018-05-04 NOTE — Telephone Encounter (Signed)
See my chart message.  Spouse was asking about referral to home instead.  I need to talk to you about ordering this.  Thanks.

## 2018-05-04 NOTE — Telephone Encounter (Signed)
Called [employer], they are a private pay company however they do give benefits to Veterans up to 20 hours a week. [name] is already receiving benefits from the Veterans from [employer], they are giving him 14 hours a week right now. The [name] has to ask for the services and that is how they received what they are getting right now. I explained to [name] that you have to have a skilled nursing need to get the regular Health Care insurance to have a [place] agency come out to the home. They were just discharged from [facility] which I think Dr Tat ordered but Im not sure.Not sure what you can possibly order for this patient. I also explained that whrn HH goals have been met then they are done and gone just like now with [facility]. She said she was going to continue to look into more care for her husband.

## 2018-05-06 ENCOUNTER — Other Ambulatory Visit: Payer: Self-pay | Admitting: Neurology

## 2018-05-06 NOTE — Telephone Encounter (Signed)
Thank you for checking on this.  In the absence of an acute change I do not think he would qualify for additional home health care at this point, since he was recently discharged.  If he still had an ongoing need that would meet the criteria for home health, he would not of been discharged in the first place.  I will await update from his wife.  Thanks.

## 2018-05-14 ENCOUNTER — Ambulatory Visit: Payer: Medicare PPO | Admitting: Psychology

## 2018-05-17 DIAGNOSIS — H5111 Convergence insufficiency: Secondary | ICD-10-CM | POA: Diagnosis not present

## 2018-05-17 DIAGNOSIS — Z8669 Personal history of other diseases of the nervous system and sense organs: Secondary | ICD-10-CM | POA: Diagnosis not present

## 2018-05-17 DIAGNOSIS — H2513 Age-related nuclear cataract, bilateral: Secondary | ICD-10-CM | POA: Diagnosis not present

## 2018-05-17 DIAGNOSIS — H04123 Dry eye syndrome of bilateral lacrimal glands: Secondary | ICD-10-CM | POA: Diagnosis not present

## 2018-05-17 DIAGNOSIS — H527 Unspecified disorder of refraction: Secondary | ICD-10-CM | POA: Diagnosis not present

## 2018-05-17 DIAGNOSIS — D3132 Benign neoplasm of left choroid: Secondary | ICD-10-CM | POA: Diagnosis not present

## 2018-05-21 NOTE — Progress Notes (Deleted)
Cardiology Office Note  Date:  05/21/2018   ID:  [name], DOB [birth year], MRN [id number]  PCP:  Tonia Ghent, MD   No chief complaint on file.   HPI:  [name] is a pleasant 68 -year-old male year-old gentleman with  Parkinson's for more than 10 years,  Hypertension, chronic bradycardia  placement of deep brain stimulator August 2015 for tremor He receives most of his primary care followup at the [facility] Notes from there indicate history of hyperlipidemia, depression, Parkinson's, possible seizure disorder Long-standing history of heart rate 40-50 bpm and in general has been asymptomatic who presents for routine follow-up of his blood pressure and bradycardia.   Frequent falls, Hit head, One week, fell three times Uses a walker Scheduled to receive a scooter  Denies any chest pain concerning for angina Asymptomatic from his bradycardia Denies orthostasis Labile blood pressure, sometimes well-controlled, other times up to 416 systolic Chronic fatigue Napping  in the daytime  Currently not participating in exercise program Chronic low back pain  Lab work reviewed Total cho 150, LDL 90   lumbar laminectomy 12/14/2012 by Dr. Annette Stable in [place]  EKG on today's visit shows sinus bradycardia rate 49 bpm, artifact from deep brain stimulator noted, left axis deviation  Other past medical history reviewed CT scan of the head 05/29/2013 showing mild chronic inflammatory disease of the sinuses. This was done for right sided headache. Work done 05/29/2013 includes basic    PMH:   has a past medical history of Arthritis, Bradycardia, Cancer ([place]) (2013), Depression, Dysrhythmia, GERD (gastroesophageal reflux disease), Headache(784.0), History of chicken pox, History of kidney stones, Hypertension, Parkinson's disease ([place]), PTSD (post-traumatic stress disorder), Shortness of breath dyspnea, Sleep apnea, and Varicose veins.  PSH:    Past Surgical History:   Procedure Laterality Date  . CHOLECYSTECTOMY N/A 10/22/2014   Procedure: LAPAROSCOPIC CHOLECYSTECTOMY WITH INTRAOPERATIVE CHOLANGIOGRAM;  Surgeon: Dia Crawford III, MD;  Location: [facility] ORS;  Service: General;  Laterality: N/A;  . cyst removed      from lip as a child  . LUMBAR LAMINECTOMY/DECOMPRESSION MICRODISCECTOMY Bilateral 12/14/2012   Procedure: Bilateral lumbar three-four, four-five decompressive laminotomy/foraminotomy;  Surgeon: Charlie Pitter, MD;  Location: [name] [facility];  Service: Neurosurgery;  Laterality: Bilateral;  . PULSE GENERATOR IMPLANT Bilateral 12/13/2013   Procedure: Bilateral implantable pulse generator placement;  Surgeon: Erline Levine, MD;  Location: [place] [facility];  Service: Neurosurgery;  Laterality: Bilateral;  Bilateral implantable pulse generator placement  . skin cancer removed     from ears,   12 lft arm  rt leg 15  . SUBTHALAMIC STIMULATOR BATTERY REPLACEMENT Bilateral 07/14/2017   Procedure: BILATERAL IMPLANTED PULSE GENERATOR CHANGE FOR DEEP BRAIN STIMULATOR;  Surgeon: Erline Levine, MD;  Location: [place];  Service: Neurosurgery;  Laterality: Bilateral;  . SUBTHALAMIC STIMULATOR INSERTION Bilateral 12/06/2013   Procedure: SUBTHALAMIC STIMULATOR INSERTION;  Surgeon: Erline Levine, MD;  Location: [place] [facility];  Service: Neurosurgery;  Laterality: Bilateral;  Bilateral deep brain stimulator placement    Current Outpatient Medications  Medication Sig Dispense Refill  . buPROPion (WELLBUTRIN XL) 150 MG 24 hr tablet TAKE 3 TABLETS DAILY 270 tablet 1  . carbidopa-levodopa (SINEMET IR) 25-100 MG tablet TAKE 2 TABLETS IN THE MORNING , 1 TABLET IN THE AFTERNOON AND 1 TABLET IN THE EVENING 360 tablet 1  . cephALEXin (KEFLEX) 500 MG capsule Take 1 capsule (500 mg total) by mouth 4 (four) times daily. 28 capsule 0  . cholecalciferol (VITAMIN D) 1000 UNITS  tablet Take 1,000 Units by mouth daily.    Marland Kitchen escitalopram (LEXAPRO) 10 MG tablet TAKE 1 TABLET DAILY 90 tablet 3  . losartan  (COZAAR) 50 MG tablet Take 1-1.5 tablets (50-75 mg total) by mouth daily.    . memantine (NAMENDA) 10 MG tablet TAKE 1 TABLET TWICE A DAY 180 tablet 0  . naproxen sodium (ALEVE) 220 MG tablet Take 440 mg by mouth 2 (two) times daily as needed (for pain or headache).    Marland Kitchen omeprazole (PRILOSEC) 20 MG capsule TAKE 1 CAPSULE DAILY 90 capsule 1  . pravastatin (PRAVACHOL) 20 MG tablet TAKE 1 TABLET EVERY EVENING (Patient taking differently: TAKE 20 MG BY MOUTH EVERY EVENING) 90 tablet 3   No current facility-administered medications for this visit.      Allergies:   Klonopin [clonazepam]; Morphine and related; and Prednisone   Social History:  The patient  reports that he has never smoked. He has quit using smokeless tobacco. He reports current alcohol use. He reports that he does not use drugs.   Family History:   family history includes Lung cancer in his father.    Review of Systems: Review of Systems  Constitutional: Positive for malaise/fatigue.  Respiratory: Negative.   Cardiovascular: Negative.   Gastrointestinal: Negative.   Musculoskeletal: Positive for falls.       Unsteady gait  Neurological: Negative.   Psychiatric/Behavioral: Negative.   All other systems reviewed and are negative.    PHYSICAL EXAM: VS:  There were no vitals taken for this visit. , BMI There is no height or weight on file to calculate BMI. GEN: Well nourished, well developed, in no acute distress, slow with movements from chair to table  HEENT: normal  Neck: no JVD, carotid bruits, or masses Cardiac:Regular rhythm, bradycardic; no murmurs, rubs, or gallops,no edema  Respiratory:  clear to auscultation bilaterally, normal work of breathing GI: soft, nontender, nondistended, + BS MS: no deformity or atrophy  Skin: warm and dry, no rash Neuro:  Strength and sensation are intact Psych: euthymic mood, minimally conversant    Recent Labs: 09/28/2017: ALT 3 10/11/2017: BUN 21; Creatinine, Ser 1.15;  Hemoglobin 15.3; Platelets 206.0; Potassium 4.0; Sodium 142    Lipid Panel Lab Results  Component Value Date   CHOL 200 09/28/2017   HDL 37.20 (L) 09/28/2017   LDLCALC 130 (H) 09/28/2017   TRIG 162.0 (H) 09/28/2017      Wt Readings from Last 3 Encounters:  12/01/17 182 lb 12 oz (82.9 kg)  11/30/17 186 lb (84.4 kg)  10/18/17 185 lb (83.9 kg)       ASSESSMENT AND PLAN:  Mixed hyperlipidemia  Cholesterol is at goal on the current lipid regimen. No changes to the medications were made.  Essential hypertension - Blood pressure is well controlled on today's visit. No changes made to the medications.  Spinal stenosis, lumbar region, with neurogenic claudication -  Chronic back pain Stable  S/P deep brain stimulator placement - Reports dramatically improved tremor  Bradycardia Asymptomatic, adequate blood pressure No indication for pacemaker Falls likely mechanical not from bradycardia  Total encounter time more than 25 minutes Greater than 50% was spent in counseling and coordination of care with the patient  Disposition:   F/U  12 months   No orders of the defined types were placed in this encounter.    Signed, Esmond Plants, M.D., Ph.D. 05/21/2018  [place], [place]

## 2018-05-23 ENCOUNTER — Ambulatory Visit: Payer: Self-pay | Admitting: Cardiovascular Disease

## 2018-05-27 ENCOUNTER — Other Ambulatory Visit: Payer: Self-pay | Admitting: Cardiovascular Disease

## 2018-05-28 ENCOUNTER — Telehealth: Payer: Self-pay

## 2018-05-28 ENCOUNTER — Ambulatory Visit: Payer: Medicare PPO | Admitting: Psychology

## 2018-05-28 MED ORDER — OSELTAMIVIR PHOSPHATE 75 MG PO CAPS: 75.0000 mg | ORAL_CAPSULE | Freq: Every day | ORAL | 0 refills | Status: DC

## 2018-05-28 NOTE — Telephone Encounter (Signed)
Wife and mother-in-law advised.

## 2018-05-28 NOTE — Telephone Encounter (Signed)
Sent.  Daily dosing for now.  If flu sx, then change to BID.  Thanks.

## 2018-05-28 NOTE — Telephone Encounter (Signed)
Jerrye Beavers lives with pt and his daughter.  The daughter is in the hospital dx with flu A.  Was suggested that the pt start Tamiflu due to exposure.  Requests rx be sent to [facility].

## 2018-06-11 ENCOUNTER — Ambulatory Visit (INDEPENDENT_AMBULATORY_CARE_PROVIDER_SITE_OTHER): Payer: Medicare PPO | Admitting: Psychology

## 2018-06-11 DIAGNOSIS — F331 Major depressive disorder, recurrent, moderate: Secondary | ICD-10-CM | POA: Diagnosis not present

## 2018-06-11 DIAGNOSIS — F4323 Adjustment disorder with mixed anxiety and depressed mood: Secondary | ICD-10-CM

## 2018-06-19 ENCOUNTER — Other Ambulatory Visit: Payer: Self-pay | Admitting: Family Medicine

## 2018-06-20 ENCOUNTER — Other Ambulatory Visit: Payer: Self-pay | Admitting: Neurology

## 2018-06-20 NOTE — Telephone Encounter (Signed)
Please advise. Looks like he may have transferred to [facility].

## 2018-06-22 ENCOUNTER — Encounter: Payer: Self-pay | Admitting: Neurology

## 2018-06-25 ENCOUNTER — Ambulatory Visit (INDEPENDENT_AMBULATORY_CARE_PROVIDER_SITE_OTHER): Payer: Medicare PPO | Admitting: Psychology

## 2018-06-25 DIAGNOSIS — F4323 Adjustment disorder with mixed anxiety and depressed mood: Secondary | ICD-10-CM | POA: Diagnosis not present

## 2018-06-25 DIAGNOSIS — F331 Major depressive disorder, recurrent, moderate: Secondary | ICD-10-CM

## 2018-06-26 ENCOUNTER — Telehealth: Payer: Self-pay | Admitting: Neurology

## 2018-06-26 ENCOUNTER — Ambulatory Visit: Payer: Self-pay | Admitting: Cardiovascular Disease

## 2018-06-26 NOTE — Telephone Encounter (Signed)
Patient dismissed from [facility] by Wells Guiles Tat DO, effective 06/22/2018. Dismissal letter was sent by registered mail. JS.

## 2018-06-30 ENCOUNTER — Other Ambulatory Visit: Payer: Self-pay | Admitting: Cardiovascular Disease

## 2018-07-04 ENCOUNTER — Telehealth: Payer: Self-pay | Admitting: Cardiovascular Disease

## 2018-07-04 NOTE — Telephone Encounter (Signed)
   Cardiac Questionnaire:    Since your last visit or hospitalization:    1. Have you been having new or worsening chest pain? No   2. Have you been having new or worsening shortness of breath? No 3. Have you been having new or worsening leg swelling, wt gain, or increase in abdominal girth (pants fitting more tightly)? No   4. Have you had any passing out spells? No    *A YES to any of these questions would result in the appointment being kept. *If all the answers to these questions are NO, we should indicate that given the current situation regarding the worldwide coronarvirus pandemic, at the recommendation of the CDC, we are looking to limit gatherings in our waiting area, and thus will reschedule their appointment beyond four weeks from today.   _____________   [id number] Pre-Screening Questions:  . Have you recently travelled abroad or to [place], [place] state, or [place]? No (3) . Do you currently have a fever? No (3) . Have you been in contact with someone that is currently pending confirmation of Covid19 testing or has been confirmed to have the [place] virus?  No (3) . Are you currently experiencing fatigue or cough? No (2) . Are you currently experiencing shortness of breath at rest or with minimal activity? No (1) . Have you been in contact with someone that was recently sick with fever/cough/fatigue? No (1)   **A score of 3 or more should result in cancellation of the pts cardiology appt.  **A score of  2 should be provided a mask prior to admission into the lobby  *Travel to a high risk area or contact with a confirmed case should stay at home, away from confirmed  patient, monitor symptoms, and reach out to PCP for e-visit, additional testing.  *ALL PTS W/ FEVER SHOULD BE REFERRED TO PCP FOR E-VISIT*

## 2018-07-06 ENCOUNTER — Ambulatory Visit: Payer: Self-pay | Admitting: Cardiovascular Disease

## 2018-07-09 ENCOUNTER — Ambulatory Visit (INDEPENDENT_AMBULATORY_CARE_PROVIDER_SITE_OTHER): Payer: Medicare PPO | Admitting: Psychology

## 2018-07-09 DIAGNOSIS — F331 Major depressive disorder, recurrent, moderate: Secondary | ICD-10-CM

## 2018-07-09 DIAGNOSIS — F4323 Adjustment disorder with mixed anxiety and depressed mood: Secondary | ICD-10-CM | POA: Diagnosis not present

## 2018-07-16 ENCOUNTER — Telehealth: Payer: Self-pay | Admitting: Cardiovascular Disease

## 2018-07-16 NOTE — Telephone Encounter (Signed)
Virtual Visit Pre-Appointment Phone Call  Steps For Call:  1. Confirm consent - "In the setting of the current Covid19 crisis, you are scheduled for a (phone or video) visit with your provider on (date) at (time).  Just as we do with many in-office visits, in order for you to participate in this visit, we must obtain consent.  If you'd like, I can send this to your mychart (if signed up) or email for you to review.  Otherwise, I can obtain your verbal consent now.  All virtual visits are billed to your insurance company just like a normal visit would be.  By agreeing to a virtual visit, we'd like you to understand that the technology does not allow for your provider to perform an examination, and thus may limit your provider's ability to fully assess your condition.  Finally, though the technology is pretty good, we cannot assure that it will always work on either your or our end, and in the setting of a video visit, we may have to convert it to a phone-only visit.  In either situation, we cannot ensure that we have a secure connection.  Are you willing to proceed?"  2. Give patient instructions for WebEx download to smartphone as below if video visit  3. Advise patient to be prepared with any vital sign or heart rhythm information, their current medicines, and a piece of paper and pen handy for any instructions they may receive the day of their visit  4. Inform patient they will receive a phone call 15 minutes prior to their appointment time (may be from unknown caller ID) so they should be prepared to answer  5. Confirm that appointment type is correct in Epic appointment notes (video vs telephone)    TELEPHONE CALL NOTE  [name] has been deemed a candidate for a follow-up tele-health visit to limit community exposure during the Covid-19 pandemic. I spoke with the patient via phone to ensure availability of phone/video source, confirm preferred email & phone number, and discuss  instructions and expectations.  I reminded [name] to be prepared with any vital sign and/or heart rhythm information that could potentially be obtained via home monitoring, at the time of his visit. I reminded [name] to expect a phone call at the time of his visit if his visit.  Did the patient verbally acknowledge consent to treatment? yes  Clarisse Gouge 07/16/2018 1:43 PM   DOWNLOADING THE [place], go to [employer] and type in WebEx in the search bar. [place] [employer], the blue/green circle. The app is free but as with any other app downloads, their phone may require them to verify saved payment information or Apple password. The patient does NOT have to create an account.  - If Android, ask patient to go to [employer] and type in WebEx in the search bar. [place] [employer], the blue/green circle. The app is free but as with any other app downloads, their phone may require them to verify saved payment information or Android password. The patient does NOT have to create an account.   CONSENT FOR TELE-HEALTH VISIT - PLEASE REVIEW  I hereby voluntarily request, consent and authorize [facility] and its employed or contracted physicians, physician assistants, nurse practitioners or other licensed health care professionals (the Practitioner), to provide me with telemedicine health care services (the Services") as deemed necessary by the treating Practitioner. I  acknowledge and consent to receive the Services by the Practitioner via telemedicine. I understand that the telemedicine visit will involve communicating with the Practitioner through live audiovisual communication technology and the disclosure of certain medical information by electronic transmission. I acknowledge that I have been given the opportunity to request an in-person assessment or other available alternative prior to the telemedicine visit and am  voluntarily participating in the telemedicine visit.  I understand that I have the right to withhold or withdraw my consent to the use of telemedicine in the course of my care at any time, without affecting my right to future care or treatment, and that the Practitioner or I may terminate the telemedicine visit at any time. I understand that I have the right to inspect all information obtained and/or recorded in the course of the telemedicine visit and may receive copies of available information for a reasonable fee.  I understand that some of the potential risks of receiving the Services via telemedicine include:   Delay or interruption in medical evaluation due to technological equipment failure or disruption;  Information transmitted may not be sufficient (e.g. poor resolution of images) to allow for appropriate medical decision making by the Practitioner; and/or   In rare instances, security protocols could fail, causing a breach of personal health information.  Furthermore, I acknowledge that it is my responsibility to provide information about my medical history, conditions and care that is complete and accurate to the best of my ability. I acknowledge that Practitioner's advice, recommendations, and/or decision may be based on factors not within their control, such as incomplete or inaccurate data provided by me or distortions of diagnostic images or specimens that may result from electronic transmissions. I understand that the practice of medicine is not an exact science and that Practitioner makes no warranties or guarantees regarding treatment outcomes. I acknowledge that I will receive a copy of this consent concurrently upon execution via email to the email address I last provided but may also request a printed copy by calling the office of [facility].    I understand that my insurance will be billed for this visit.   I have read or had this consent read to me.  I understand the  contents of this consent, which adequately explains the benefits and risks of the Services being provided via telemedicine.   I have been provided ample opportunity to ask questions regarding this consent and the Services and have had my questions answered to my satisfaction.  I give my informed consent for the services to be provided through the use of telemedicine in my medical care  By participating in this telemedicine visit I agree to the above.

## 2018-07-17 ENCOUNTER — Other Ambulatory Visit: Payer: Self-pay

## 2018-07-17 ENCOUNTER — Telehealth (INDEPENDENT_AMBULATORY_CARE_PROVIDER_SITE_OTHER): Payer: Medicare PPO | Admitting: Cardiovascular Disease

## 2018-07-17 DIAGNOSIS — E782 Mixed hyperlipidemia: Secondary | ICD-10-CM

## 2018-07-17 DIAGNOSIS — G20 Parkinson's disease: Secondary | ICD-10-CM | POA: Diagnosis not present

## 2018-07-17 DIAGNOSIS — R001 Bradycardia, unspecified: Secondary | ICD-10-CM | POA: Diagnosis not present

## 2018-07-17 DIAGNOSIS — I10 Essential (primary) hypertension: Secondary | ICD-10-CM | POA: Diagnosis not present

## 2018-07-17 DIAGNOSIS — I471 Supraventricular tachycardia: Secondary | ICD-10-CM

## 2018-07-17 MED ORDER — LOSARTAN POTASSIUM 50 MG PO TABS: 50.0000 mg | ORAL_TABLET | Freq: Every day | ORAL | 3 refills | Status: DC

## 2018-07-17 NOTE — Patient Instructions (Addendum)
Medication Instructions:  Continue losartan 50 daily (90 day to [facility])   If you need a refill on your cardiac medications before your next appointment, please call your pharmacy.    Lab work: No new labs needed   If you have labs (blood work) drawn today and your tests are completely normal, you will receive your results only by: Marland Kitchen MyChart Message (if you have MyChart) OR . A paper copy in the mail If you have any lab test that is abnormal or we need to change your treatment, we will call you to review the results.   Testing/Procedures: No new testing needed   Follow-Up: At [facility], you and your health needs are our priority.  As part of our continuing mission to provide you with exceptional heart care, we have created designated Provider Care Teams.  These Care Teams include your primary Cardiologist (physician) and Advanced Practice Providers (APPs -  Physician Assistants and Nurse Practitioners) who all work together to provide you with the care you need, when you need it.  . You will need a follow up appointment in 6 months .   Please call our office 2 months in advance to schedule this appointment.    . Providers on your designated Care Team:   . Murray Hodgkins, NP . Christell Faith, PA-C . Marrianne Mood, PA-C  Any Other Special Instructions Will Be Listed Below (If Applicable).  For educational health videos Log in to : [email or web address] Or : [email or web address], password : [email or web address]

## 2018-07-17 NOTE — Addendum Note (Signed)
Addended by: Valora Corporal on: 07/17/2018 03:55 PM   Modules accepted: Orders

## 2018-07-17 NOTE — Progress Notes (Addendum)
Virtual Visit via Video Note   This visit type was conducted due to national recommendations for restrictions regarding the COVID-19 Pandemic (e.g. social distancing) in an effort to limit this patient's exposure and mitigate transmission in our community.  Due to her co-morbid illnesses, this patient is at least at moderate risk for complications without adequate follow up.  This format is felt to be most appropriate for this patient at this time.  All issues noted in this document were discussed and addressed.  A limited physical exam was performed with this format.  Please refer to the patient's chart for her consent to telehealth for [facility].   Date:  07/17/2018   ID:  [name], DOB 1951-02-06, MRN [id number]  Patient Location:  [place] [address]   Provider location:   [facility], [place] office  PCP:  Tonia Ghent, MD  Cardiologist:  Rockey Situ  Chief Complaint: Shuffle gait, tired, history of falls, labile blood pressure    History of Present Illness:    [name] is a 67 y.o. male who presents via audio/video conferencing for a telehealth visit today.   The patient does not symptoms concerning for COVID-19 infection (fever, chills, cough, or new SHORTNESS OF BREATH).  Please refer to prior office visit for complete details: Patient has a past medical history of  Parkinson's for more than 10 years,  Hypertension, chronic bradycardia  placement of deep brain stimulator August 2015 for tremor He receives most of his primary care followup at the [facility] Notes from there indicate history of hyperlipidemia, depression, Parkinson's, possible seizure disorder Long-standing history of heart rate 40-50 bpm and in general has been asymptomatic who presents for routine follow-up of his blood pressure and bradycardia.   On his last clinic visit he reported having frequent falls, even head trauma 1 week fell 3 times Using a  walker, was scheduled to receive a scooter  Has labile hypertension Chronic fatigue, frequent napping  Previous total cholesterol 150 LDL 90  Blood pressure  109/76, heart rate 46, on losartan 50 daily  No orthostasis Days when he does not eat,  Drinks minimal,   Walking better, seeing a new neurology   Prior CV studies:   The following studies were reviewed today:  No new testing   Past Medical History:  Diagnosis Date  . Arthritis   . Bradycardia   . Cancer [facility]) 2013   skin cancer  . Depression    ptsd  . Dysrhythmia    chronic slow heart rate  . GERD (gastroesophageal reflux disease)   . Headache(784.0)    tension headaches non recent  . History of chicken pox   . History of kidney stones    passed  . Hypertension    treated with HCTZ  . Parkinson's disease ([place])    dx'ed 15 years ago  . PTSD (post-traumatic stress disorder)   . Shortness of breath dyspnea   . Sleep apnea    doesn't use C-pap  . Varicose veins    Past Surgical History:  Procedure Laterality Date  . CHOLECYSTECTOMY N/A 10/22/2014   Procedure: LAPAROSCOPIC CHOLECYSTECTOMY WITH INTRAOPERATIVE CHOLANGIOGRAM;  Surgeon: Dia Crawford III, MD;  Location: [facility] ORS;  Service: General;  Laterality: N/A;  . cyst removed      from lip as a child  . LUMBAR LAMINECTOMY/DECOMPRESSION MICRODISCECTOMY Bilateral 12/14/2012   Procedure: Bilateral lumbar three-four, four-five decompressive laminotomy/foraminotomy;  Surgeon: Charlie Pitter,  MD;  Location: [place] [facility] OR[facility];  Service: Neurosurgery;  Laterality: Bilateral;  . PULSE GENERATOR IMPLANT Bilateral 12/13/2013   Procedure: Bilateral implantable pulse generator placement;  Surgeon: Erline Levine, MD;  Location: [place] [facility] OR[facility];  Service: Neurosurgery;  Laterality: Bilateral;  Bilateral implantable pulse generator placement  . skin cancer removed     from ears,   12 lft arm  rt leg 15  . SUBTHALAMIC STIMULATOR BATTERY REPLACEMENT Bilateral 07/14/2017   Procedure:  BILATERAL IMPLANTED PULSE GENERATOR CHANGE FOR DEEP BRAIN STIMULATOR;  Surgeon: Erline Levine, MD;  Location: [place];  Service: Neurosurgery;  Laterality: Bilateral;  . SUBTHALAMIC STIMULATOR INSERTION Bilateral 12/06/2013   Procedure: SUBTHALAMIC STIMULATOR INSERTION;  Surgeon: Erline Levine, MD;  Location: [place] [facility] OR[facility];  Service: Neurosurgery;  Laterality: Bilateral;  Bilateral deep brain stimulator placement     No outpatient medications have been marked as taking for the 07/17/18 encounter (Appointment) with Minna Merritts, MD.     Allergies:   Klonopin [clonazepam]; Morphine and related; and Prednisone   Social History   Tobacco Use  . Smoking status: Never Smoker  . Smokeless tobacco: Former Network engineer Use Topics  . Alcohol use: Yes    Alcohol/week: 0.0 standard drinks    Comment: occasional (twice per month)  . Drug use: No     Current Outpatient Medications on File Prior to Visit  Medication Sig Dispense Refill  . buPROPion (WELLBUTRIN XL) 150 MG 24 hr tablet TAKE 3 TABLETS DAILY 270 tablet 1  . carbidopa-levodopa (SINEMET IR) 25-100 MG tablet TAKE 2 TABLETS IN THE MORNING , 1 TABLET IN THE AFTERNOON AND 1 TABLET IN THE EVENING 360 tablet 1  . cephALEXin (KEFLEX) 500 MG capsule Take 1 capsule (500 mg total) by mouth 4 (four) times daily. 28 capsule 0  . cholecalciferol (VITAMIN D) 1000 UNITS tablet Take 1,000 Units by mouth daily.    Marland Kitchen escitalopram (LEXAPRO) 10 MG tablet TAKE 1 TABLET DAILY 90 tablet 3  . losartan (COZAAR) 50 MG tablet TAKE 1 TABLET(50 MG) BY MOUTH DAILY 30 tablet 0  . memantine (NAMENDA) 10 MG tablet TAKE 1 TABLET TWICE A DAY 180 tablet 0  . naproxen sodium (ALEVE) 220 MG tablet Take 440 mg by mouth 2 (two) times daily as needed (for pain or headache).    Marland Kitchen omeprazole (PRILOSEC) 20 MG capsule TAKE 1 CAPSULE DAILY 90 capsule 4  . oseltamivir (TAMIFLU) 75 MG capsule Take 1 capsule (75 mg total) by mouth daily. 10 capsule 0  . pravastatin (PRAVACHOL) 20 MG  tablet TAKE 20 MG BY MOUTH EVERY EVENING 90 tablet 4   No current facility-administered medications on file prior to visit.      Family Hx: The patient's family history includes Lung cancer in his father. There is no history of Colon cancer or Prostate cancer.  ROS:   Please see the history of present illness.    Review of Systems  Constitutional: Negative.        Sleeping alot  Respiratory: Negative.   Cardiovascular: Negative.   Gastrointestinal: Negative.   Musculoskeletal: Positive for falls.       Gait instability, leg weakness  Neurological: Negative.   Psychiatric/Behavioral: Negative.   All other systems reviewed and are negative.    Labs/Other Tests and Data Reviewed:    Recent Labs: 09/28/2017: ALT 3 10/11/2017: BUN 21; Creatinine, Ser 1.15; Hemoglobin 15.3; Platelets 206.0; Potassium 4.0; Sodium 142   Recent Lipid Panel Lab Results  Component Value Date/Time  CHOL 200 09/28/2017 09:34 AM   CHOL 185 06/03/2013 10:51 AM   TRIG 162.0 (H) 09/28/2017 09:34 AM   HDL 37.20 (L) 09/28/2017 09:34 AM   HDL 35 (L) 06/03/2013 10:51 AM   CHOLHDL 5 09/28/2017 09:34 AM   LDLCALC 130 (H) 09/28/2017 09:34 AM   LDLCALC 127 (H) 06/03/2013 10:51 AM    Wt Readings from Last 3 Encounters:  12/01/17 182 lb 12 oz (82.9 kg)  11/30/17 186 lb (84.4 kg)  10/18/17 185 lb (83.9 kg)     Exam:    Vital Signs:  There were no vitals taken for this visit.   Well nourished, well developed male in no acute distress.   ASSESSMENT & PLAN:    Atrial tachycardia (HCC) No sx, no med changes  PD (Parkinson's disease) ([place]) On new meds,  Walking more, overall family happy with his disease stability  Mixed hyperlipidemia Not on a statin  Essential hypertension Labile blood pressure, was running high recently and we started losartan 50 Seems to be up and down, in general okay but today was running a little bit low Has had minimal fluid intake Recommended to family if he continues to  have poor intake and run slow we may need to decrease losartan down to 25 daily  Bradycardia Chronic issue,  Will potentially run his blood pressure low but high, was low on today's visit   COVID-19 Education: The signs and symptoms of COVID-19 were discussed with the patient and how to seek care for testing (follow up with PCP or arrange E-visit).  The importance of social distancing was discussed today.  Patient Risk:   After full review of this patients clinical status, I feel that they are at least moderate risk at this time.  Time:   Today, I have spent 25 minutes with the patient with telehealth technology discussing blood pressure, signs of orthostasis, bradycardia, Parkinson's.     Medication Adjustments/Labs and Tests Ordered: Current medicines are reviewed at length with the patient today.  Concerns regarding medicines are outlined above.   Tests Ordered: No tests ordered   Medication Changes: No changes made   Disposition: Follow-up in 6 months   Signed, Ida Rogue, MD  07/17/2018 10:49 AM    [place] Office [address], [place], [place] [address]

## 2018-08-05 ENCOUNTER — Other Ambulatory Visit: Payer: Self-pay | Admitting: Neurology

## 2018-08-06 ENCOUNTER — Ambulatory Visit (INDEPENDENT_AMBULATORY_CARE_PROVIDER_SITE_OTHER): Payer: Medicare PPO | Admitting: Psychology

## 2018-08-06 DIAGNOSIS — F331 Major depressive disorder, recurrent, moderate: Secondary | ICD-10-CM

## 2018-08-06 DIAGNOSIS — F4323 Adjustment disorder with mixed anxiety and depressed mood: Secondary | ICD-10-CM | POA: Diagnosis not present

## 2018-08-10 ENCOUNTER — Other Ambulatory Visit: Payer: Self-pay | Admitting: Family Medicine

## 2018-08-13 ENCOUNTER — Other Ambulatory Visit: Payer: Self-pay | Admitting: Family Medicine

## 2018-08-13 ENCOUNTER — Encounter: Payer: Self-pay | Admitting: Family Medicine

## 2018-08-13 NOTE — Addendum Note (Signed)
Addended by: Magdalen Spatz C on: 08/13/2018 12:50 PM   Modules accepted: Orders

## 2018-08-13 NOTE — Telephone Encounter (Signed)
Pharmacy requests refill on patients bupropion.   LAST REFILL: #[id number] 1 refill on 10/30/17 LAST OV:   05/03/17 *aside from 2 acute visits in July 19 and August 19 NEXT OV: 10/09/18 for annual exam  *Note patient's wife sent a message via my chart today requesting refill on memantine and asked to deny wellbutrin as [facility] 3 months ago.   R/x refill denied.

## 2018-08-14 ENCOUNTER — Other Ambulatory Visit: Payer: Self-pay | Admitting: Family Medicine

## 2018-08-14 MED ORDER — MEMANTINE HCL 10 MG PO TABS: 10.0000 mg | ORAL_TABLET | Freq: Two times a day (BID) | ORAL | 1 refills | Status: DC

## 2018-08-20 ENCOUNTER — Telehealth: Payer: Self-pay

## 2018-08-20 ENCOUNTER — Ambulatory Visit (INDEPENDENT_AMBULATORY_CARE_PROVIDER_SITE_OTHER): Payer: Medicare PPO | Admitting: Psychology

## 2018-08-20 DIAGNOSIS — F331 Major depressive disorder, recurrent, moderate: Secondary | ICD-10-CM | POA: Diagnosis not present

## 2018-08-20 DIAGNOSIS — F4323 Adjustment disorder with mixed anxiety and depressed mood: Secondary | ICD-10-CM | POA: Diagnosis not present

## 2018-08-20 NOTE — Telephone Encounter (Signed)
Without SOB or emergent sx, and with his history, I am hesitant to change his meds.  I am worried that putting him on an escalating dose of diuretic will not improve the situation and may complicate it by making him lightheaded/more likely to fall.  As this sounds like a dependent issue, I would try elevating the feet and update me if progressive sx.  I would try drinking enough fluid to keep his urine clear or light colored.  Thanks.

## 2018-08-20 NOTE — Telephone Encounter (Signed)
Patient's wife, Hal Hope, calls to report increased swelling in hands and feet.  She was able to weigh him for me while on the phone:   Weight:  186.8 Last weight in office on 12/01/17: 182  No acute changes to breathing, confusion or agitation.   Swelling is centered to B hands and feet only.  No edema to lower extremities or abdomen.   No redness, warmth, pain or tenderness to the areas.   He has had decreased urinary frequency, only going about 2x/day and is not drinking very much.  But, patient states when he does go, "a lot comes out".   Otherwise he is baseline per wife.   Will forward to Dr. Damita Dunnings for further advice.

## 2018-08-20 NOTE — Telephone Encounter (Signed)
[facility] Patient Name: [name] Gender: Male DOB: [birth year] Age: 68 Y 2 M 16 D Return Phone Number: [phone] (Primary) Address: City/State/Zip: [place] [address] Client [place] Night - Client Client Site [place] Physician Renford Dills - MD Contact Type Call Who Is Calling Patient / Member / Family / Caregiver Call Type Triage / Clinical Caller Name [name] Lapinski Relationship To Patient Spouse Return Phone Number [phone] (Primary) Chief Complaint Feet swelling Reason for Call Symptomatic / Request for [place] states her husband's hands and feet are swelling. Translation No Nurse Assessment Guidelines Guideline Title Affirmed Question Affirmed Notes Nurse Date/Time (Eastern Time) Disp. Time Eilene Ghazi Time) Disposition Final User 08/19/2018 4:51:59 PM Attempt made - message left Cumesty, RN, Sara 08/19/2018 5:01:25 PM FINAL ATTEMPT MADE - message left

## 2018-08-20 NOTE — Telephone Encounter (Signed)
Wife advised. 

## 2018-09-03 ENCOUNTER — Ambulatory Visit (INDEPENDENT_AMBULATORY_CARE_PROVIDER_SITE_OTHER): Payer: Medicare PPO | Admitting: Psychology

## 2018-09-03 DIAGNOSIS — F4323 Adjustment disorder with mixed anxiety and depressed mood: Secondary | ICD-10-CM

## 2018-09-03 DIAGNOSIS — F331 Major depressive disorder, recurrent, moderate: Secondary | ICD-10-CM | POA: Diagnosis not present

## 2018-09-17 ENCOUNTER — Ambulatory Visit (INDEPENDENT_AMBULATORY_CARE_PROVIDER_SITE_OTHER): Payer: Medicare PPO | Admitting: Psychology

## 2018-09-17 DIAGNOSIS — F331 Major depressive disorder, recurrent, moderate: Secondary | ICD-10-CM

## 2018-09-17 DIAGNOSIS — F4321 Adjustment disorder with depressed mood: Secondary | ICD-10-CM

## 2018-09-25 ENCOUNTER — Other Ambulatory Visit: Payer: Self-pay

## 2018-09-25 ENCOUNTER — Encounter: Payer: Self-pay | Admitting: Family Medicine

## 2018-09-25 ENCOUNTER — Other Ambulatory Visit: Payer: Self-pay | Admitting: Family Medicine

## 2018-09-25 ENCOUNTER — Ambulatory Visit (INDEPENDENT_AMBULATORY_CARE_PROVIDER_SITE_OTHER): Payer: Medicare PPO | Admitting: Family Medicine

## 2018-09-25 ENCOUNTER — Ambulatory Visit (INDEPENDENT_AMBULATORY_CARE_PROVIDER_SITE_OTHER)
Admission: RE | Admit: 2018-09-25 | Discharge: 2018-09-25 | Disposition: A | Discharge status: Home / Self Care | Payer: Medicare PPO | Source: Ambulatory Visit | Attending: Family Medicine | Admitting: Family Medicine

## 2018-09-25 VITALS — BP 122/70 | HR 47 | Temp 98.5°F | Ht 68.0 in | Wt 189.2 lb

## 2018-09-25 DIAGNOSIS — R0781 Pleurodynia: Secondary | ICD-10-CM | POA: Diagnosis not present

## 2018-09-25 DIAGNOSIS — M48062 Spinal stenosis, lumbar region with neurogenic claudication: Secondary | ICD-10-CM | POA: Diagnosis not present

## 2018-09-25 DIAGNOSIS — W19XXXA Unspecified fall, initial encounter: Secondary | ICD-10-CM | POA: Diagnosis not present

## 2018-09-25 DIAGNOSIS — G20A1 Parkinson's disease without dyskinesia, without mention of fluctuations: Secondary | ICD-10-CM

## 2018-09-25 DIAGNOSIS — S299XXA Unspecified injury of thorax, initial encounter: Secondary | ICD-10-CM | POA: Diagnosis not present

## 2018-09-25 DIAGNOSIS — G20 Parkinson's disease: Secondary | ICD-10-CM | POA: Diagnosis not present

## 2018-09-25 NOTE — Assessment & Plan Note (Addendum)
Reproducible posterior inferior ribcage pain on right anticipate rib strain/contusion after fall yesterday morning. Check xray r/o fracture - possible hairline - will await radiology eval. Lungs clear on exam. Discussed anticipated course of recovery. Supportive care reviewed with patient. Update if not improving with treatment, consider low dose tramadol.

## 2018-09-25 NOTE — Progress Notes (Signed)
This visit was conducted in person.  BP 122/70 (BP Location: Left Arm, Patient Position: Sitting, Cuff Size: Normal)   Pulse (!) 47   Temp 98.5 F (36.9 C) (Oral)   Ht 5\' 8"  (1.727 m)   Wt 189 lb 4 oz (85.8 kg)   SpO2 97%   BMI 28.78 kg/m    CC: fall at home Subjective:    Patient ID: [name], male    DOB: Mar 04, 1951, 68 y.o.   MRN: [id number]  HPI: [name] is a 68 y.o. male presenting on 09/25/2018 for Fall (Pt fell on 09/24/18 at home. C/o pain in right flank. Pt accompanied by wife, Candi. )   DOI: 09/24/2018  Suffered fall while getting to bed at 1am yesterday missed bed and landed on chest at foot of bed. Hit R lower back. Persistent pain at area. No radiation of pain down legs, numbness or weakness of legs, bowel/bladder incontinence. Treated with naproxyn 440mg  without benefit.   Not on blood thinners.   Known parkinson's disease and lumbar stenosis with neurogenic claudication which limits mobility. Regularly ambulates with cane.      Relevant past medical, surgical, family and social history reviewed and updated as indicated. Interim medical history since our last visit reviewed. Allergies and medications reviewed and updated. Outpatient Medications Prior to Visit  Medication Sig Dispense Refill  . carbidopa-levodopa (SINEMET IR) 25-100 MG tablet TAKE 2 TABLETS IN THE MORNING , 1 TABLET IN THE AFTERNOON AND 1 TABLET IN THE EVENING (Patient taking differently: Take 1 tablet by mouth 3 (three) times daily. ) 360 tablet 1  . cholecalciferol (VITAMIN D) 1000 UNITS tablet Take 1,000 Units by mouth daily.    Marland Kitchen escitalopram (LEXAPRO) 10 MG tablet TAKE 1 TABLET DAILY 90 tablet 0  . losartan (COZAAR) 50 MG tablet Take 1 tablet (50 mg total) by mouth daily. 90 tablet 3  . memantine (NAMENDA) 10 MG tablet Take 1 tablet (10 mg total) by mouth 2 (two) times daily. 180 tablet 1  . naproxen sodium (ALEVE) 220 MG tablet Take 440 mg by mouth 2 (two) times daily as needed (for  pain or headache).    Marland Kitchen omeprazole (PRILOSEC) 20 MG capsule TAKE 1 CAPSULE DAILY 90 capsule 4  . pravastatin (PRAVACHOL) 20 MG tablet TAKE 20 MG BY MOUTH EVERY EVENING 90 tablet 4   No facility-administered medications prior to visit.      Per HPI unless specifically indicated in ROS section below Review of Systems Objective:    BP 122/70 (BP Location: Left Arm, Patient Position: Sitting, Cuff Size: Normal)   Pulse (!) 47   Temp 98.5 F (36.9 C) (Oral)   Ht 5\' 8"  (1.727 m)   Wt 189 lb 4 oz (85.8 kg)   SpO2 97%   BMI 28.78 kg/m   Wt Readings from Last 3 Encounters:  09/25/18 189 lb 4 oz (85.8 kg)  12/01/17 182 lb 12 oz (82.9 kg)  11/30/17 186 lb (84.4 kg)    Physical Exam Vitals signs and nursing note reviewed.  Constitutional:      General: He is not in acute distress.    Appearance: Normal appearance. He is not ill-appearing.  Cardiovascular:     Rate and Rhythm: Normal rate and regular rhythm.     Pulses: Normal pulses.     Heart sounds: Normal heart sounds. No murmur.  Pulmonary:     Effort: Pulmonary effort is normal. No respiratory distress.     Breath  sounds: Normal breath sounds. No wheezing, rhonchi or rales.       Comments: Area of reproducible tenderness at inferior R posterior ribcage Chest:     Chest wall: Tenderness present.  Musculoskeletal:        General: No tenderness.     Comments: No pain midline spine No significant paraspinous mm tenderness Neg seated SLR bilaterally. No pain with int/ext rotation at hip. No pain at SIJ, GTB or sciatic notch bilaterally.   Neurological:     Mental Status: He is alert.       Assessment & Plan:   Problem List Items Addressed This Visit    Spinal stenosis, lumbar region, with neurogenic claudication   Rib pain on right side - Primary    Reproducible posterior inferior ribcage pain on right anticipate rib strain/contusion after fall yesterday morning. Check xray r/o fracture - possible hairline - will await  radiology eval. Lungs clear on exam. Discussed anticipated course of recovery. Supportive care reviewed with patient. Update if not improving with treatment, consider low dose tramadol.       Parkinson's disease ([place])   Accidental fall       No orders of the defined types were placed in this encounter.  No orders of the defined types were placed in this encounter.   Follow up plan: Return if symptoms worsen or fail to improve.  Ria Bush, MD

## 2018-09-25 NOTE — Patient Instructions (Signed)
Xray today I think you have right lower ribcage bruise.  Treat with heating pad to tender area (not directly on the skin but covered in a towel, for 10 minutes at a time, 1-2 times a day).  Continue naprosyn twice daily, alternating with tylenol 500mg  three times daily. Let us know if this doesn't help control pain.

## 2018-10-01 ENCOUNTER — Ambulatory Visit: Payer: Medicare PPO | Admitting: Psychology

## 2018-10-02 ENCOUNTER — Ambulatory Visit (INDEPENDENT_AMBULATORY_CARE_PROVIDER_SITE_OTHER): Payer: Medicare PPO

## 2018-10-02 ENCOUNTER — Other Ambulatory Visit: Payer: Medicare PPO

## 2018-10-02 ENCOUNTER — Other Ambulatory Visit: Payer: Self-pay

## 2018-10-02 ENCOUNTER — Other Ambulatory Visit: Payer: Self-pay | Admitting: Family Medicine

## 2018-10-02 VITALS — BP 129/65 | HR 43 | Wt 187.0 lb

## 2018-10-02 DIAGNOSIS — Z0000 Encounter for general adult medical examination without abnormal findings: Secondary | ICD-10-CM | POA: Diagnosis not present

## 2018-10-02 DIAGNOSIS — M10079 Idiopathic gout, unspecified ankle and foot: Secondary | ICD-10-CM

## 2018-10-02 DIAGNOSIS — I10 Essential (primary) hypertension: Secondary | ICD-10-CM

## 2018-10-02 NOTE — Patient Instructions (Signed)
[name] , Thank you for taking time to come for your Medicare Wellness Visit. I appreciate your ongoing commitment to your health goals. Please review the following plan we discussed and let me know if I can assist you in the future.   These are the goals we discussed: Goals    . physical     When weather permits, I will attempt to walk at least 10 min every other day.       This is a list of the screening recommended for you and due dates:  Health Maintenance  Topic Date Due  . Stool Blood Test  04/18/2019*  . Tetanus Vaccine  07/26/2026*  . Flu Shot  11/17/2018  .  Hepatitis C: One time screening is recommended by Center for Disease Control  (CDC) for  adults born from [birth year] through 1966.   Completed  . Pneumonia vaccines  Completed  . Colon Cancer Screening  Discontinued  *Topic was postponed. The date shown is not the original due date.   Preventive Care for Adults  A healthy lifestyle and preventive care can promote health and wellness. Preventive health guidelines for adults include the following key practices.  . A routine yearly physical is a good way to check with your health care provider about your health and preventive screening. It is a chance to share any concerns and updates on your health and to receive a thorough exam.  . Visit your dentist for a routine exam and preventive care every 6 months. Brush your teeth twice a day and floss once a day. Good oral hygiene prevents tooth decay and gum disease.  . The frequency of eye exams is based on your age, health, family medical history, use  of contact lenses, and other factors. Follow your health care provider's recommendations for frequency of eye exams.  . Eat a healthy diet. Foods like vegetables, fruits, whole grains, low-fat dairy products, and lean protein foods contain the nutrients you need without too many calories. Decrease your intake of foods high in solid fats, added sugars, and salt. Eat the right  amount of calories for you. Get information about a proper diet from your health care provider, if necessary.  . Regular physical exercise is one of the most important things you can do for your health. Most adults should get at least 150 minutes of moderate-intensity exercise (any activity that increases your heart rate and causes you to sweat) each week. In addition, most adults need muscle-strengthening exercises on 2 or more days a week.  Silver Sneakers may be a benefit available to you. To determine eligibility, you may visit the website: www.silversneakers.com or contact program at 941 291 9162 Mon-Fri between 8AM-8PM.   . Maintain a healthy weight. The body mass index (BMI) is a screening tool to identify possible weight problems. It provides an estimate of body fat based on height and weight. Your health care provider can find your BMI and can help you achieve or maintain a healthy weight.   For adults 20 years and older: ? A BMI below 18.5 is considered underweight. ? A BMI of 18.5 to 24.9 is normal. ? A BMI of 25 to 29.9 is considered overweight. ? A BMI of 30 and above is considered obese.   . Maintain normal blood lipids and cholesterol levels by exercising and minimizing your intake of saturated fat. Eat a balanced diet with plenty of fruit and vegetables. Blood tests for lipids and cholesterol should begin at age 46 and be  repeated every 5 years. If your lipid or cholesterol levels are high, you are over 50, or you are at high risk for heart disease, you may need your cholesterol levels checked more frequently. Ongoing high lipid and cholesterol levels should be treated with medicines if diet and exercise are not working.  . If you smoke, find out from your health care provider how to quit. If you do not use tobacco, please do not start.  . If you choose to drink alcohol, please do not consume more than 2 drinks per day. One drink is considered to be 12 ounces (355 mL) of beer, 5  ounces (148 mL) of wine, or 1.5 ounces (44 mL) of liquor.  . If you are 81-37 years old, ask your health care provider if you should take aspirin to prevent strokes.  . Use sunscreen. Apply sunscreen liberally and repeatedly throughout the day. You should seek shade when your shadow is shorter than you. Protect yourself by wearing long sleeves, pants, a wide-brimmed hat, and sunglasses year round, whenever you are outdoors.  . Once a month, do a whole body skin exam, using a mirror to look at the skin on your back. Tell your health care provider of new moles, moles that have irregular borders, moles that are larger than a pencil eraser, or moles that have changed in shape or color.

## 2018-10-02 NOTE — Progress Notes (Signed)
Subjective:   [name] is a 68 y.o. male who presents for Medicare Annual/Subsequent preventive examination.  Review of Systems:  N/A Cardiac Risk Factors include: advanced age (>9men, >71 women);male gender;hypertension;dyslipidemia     Objective:    Vitals: BP 129/65 Comment: patient supplied   Pulse (!) 43 Comment: patient supplied   Wt 187 lb (84.8 kg) Comment: patient supplied   SpO2 97% Comment: patient supplied   BMI 28.43 kg/m   Body mass index is 28.43 kg/m.  Advanced Directives 10/02/2018 02/20/2018 02/15/2018 02/01/2018 01/25/2018 01/18/2018 01/09/2018  Does Patient Have a Medical Advance Directive? Yes Yes Yes Yes Yes Yes Yes  Type of Paramedic of [place];Living will [place];Living will [place];Living will [place];Living will [place];Living will [place];Living will [place];Living will  Does patient want to make changes to medical advance directive? No - Patient declined - - - - - -  Copy of [place] in Chart? No - copy requested No - copy requested No - copy requested No - copy requested No - copy requested No - copy requested No - copy requested  Would patient like information on creating a medical advance directive? - - - - - - -    Tobacco Social History   Tobacco Use  Smoking Status Never Smoker  Smokeless Tobacco Former Engineer, structural given: No   Clinical Intake:  Pre-visit preparation completed: Yes  Pain : No/denies pain Pain Score: 0-No pain     Nutritional Status: BMI 25 -29 Overweight Nutritional Risks: None  How often do you need to have someone help you when you read instructions, pamphlets, or other written materials from your doctor or pharmacy?: 1 - Never What is the last grade level you completed in school?: 12th grade + some college  Interpreter Needed?:  No  Comments: LPinson, RN  Past Medical History:  Diagnosis Date   Arthritis    Bradycardia    Cancer ([place]) 2013   skin cancer   Depression    ptsd   Dysrhythmia    chronic slow heart rate   GERD (gastroesophageal reflux disease)    Headache(784.0)    tension headaches non recent   History of chicken pox    History of kidney stones    passed   Hypertension    treated with HCTZ   Parkinson's disease ([place])    dx'ed 15 years ago   PTSD (post-traumatic stress disorder)    Shortness of breath dyspnea    Sleep apnea    doesn't use C-pap   Varicose veins    Past Surgical History:  Procedure Laterality Date   CHOLECYSTECTOMY N/A 10/22/2014   Procedure: LAPAROSCOPIC CHOLECYSTECTOMY WITH INTRAOPERATIVE CHOLANGIOGRAM;  Surgeon: Dia Crawford III, MD;  Location: [facility] ORS;  Service: General;  Laterality: N/A;   cyst removed      from lip as a child   LUMBAR LAMINECTOMY/DECOMPRESSION MICRODISCECTOMY Bilateral 12/14/2012   Procedure: Bilateral lumbar three-four, four-five decompressive laminotomy/foraminotomy;  Surgeon: Charlie Pitter, MD;  Location: [facility];  Service: Neurosurgery;  Laterality: Bilateral;   PULSE GENERATOR IMPLANT Bilateral 12/13/2013   Procedure: Bilateral implantable pulse generator placement;  Surgeon: Erline Levine, MD;  Location: [facility];  Service: Neurosurgery;  Laterality: Bilateral;  Bilateral implantable pulse generator placement   skin cancer removed     from ears,   12 lft  arm  rt leg 15   SUBTHALAMIC STIMULATOR BATTERY REPLACEMENT Bilateral 07/14/2017   Procedure: BILATERAL IMPLANTED PULSE GENERATOR CHANGE FOR DEEP BRAIN STIMULATOR;  Surgeon: Erline Levine, MD;  Location: [place];  Service: Neurosurgery;  Laterality: Bilateral;   SUBTHALAMIC STIMULATOR INSERTION Bilateral 12/06/2013   Procedure: SUBTHALAMIC STIMULATOR INSERTION;  Surgeon: Erline Levine, MD;  Location: [place] [facility];  Service: Neurosurgery;  Laterality: Bilateral;  Bilateral  deep brain stimulator placement   Family History  Problem Relation Age of Onset   Lung cancer Father    Colon cancer Neg Hx    Prostate cancer Neg Hx    Social History   Socioeconomic History   Marital status: Married    Spouse name: Not on file   Number of children: Not on file   Years of education: Not on file   Highest education level: Not on file  Occupational History   Occupation: disabled    Comment: 2001, PD    Comment: search and Mining engineer  Social Needs   Financial resource strain: Not on file   Food insecurity    Worry: Not on file    Inability: Not on file   Transportation needs    Medical: Not on file    Non-medical: Not on file  Tobacco Use   Smoking status: Never Smoker   Smokeless tobacco: Former Systems developer  Substance and Sexual Activity   Alcohol use: Yes    Alcohol/week: 0.0 standard drinks    Comment: occasional (twice per month)   Drug use: No   Sexual activity: Not Currently  Lifestyle   Physical activity    Days per week: Not on file    Minutes per session: Not on file   Stress: Not on file  Relationships   Social connections    Talks on phone: Not on file    Gets together: Not on file    Attends religious service: Not on file    Active member of club or organization: Not on file    Attends meetings of clubs or organizations: Not on file    Relationship status: Not on file  Other Topics Concern   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  Prev search and rescue work   3 kids from prev relationship.    Married 2014    Outpatient Encounter Medications as of 10/02/2018  Medication Sig   carbidopa-levodopa (SINEMET IR) 25-100 MG tablet TAKE 2 TABLETS IN THE MORNING , 1 TABLET IN THE AFTERNOON AND 1 TABLET IN THE EVENING (Patient taking differently: Take 1 tablet by mouth 3 (three) times daily. )   cholecalciferol (VITAMIN D) 1000 UNITS tablet Take 1,000 Units by  mouth daily.   escitalopram (LEXAPRO) 10 MG tablet TAKE 1 TABLET DAILY   losartan (COZAAR) 50 MG tablet Take 1 tablet (50 mg total) by mouth daily.   memantine (NAMENDA) 10 MG tablet Take 1 tablet (10 mg total) by mouth 2 (two) times daily.   naproxen sodium (ALEVE) 220 MG tablet Take 440 mg by mouth 2 (two) times daily as needed (for pain or headache).   omeprazole (PRILOSEC) 20 MG capsule TAKE 1 CAPSULE DAILY   pravastatin (PRAVACHOL) 20 MG tablet TAKE 20 MG BY MOUTH EVERY EVENING   No facility-administered encounter medications on file as of 10/02/2018.     Activities of Daily Living In your present state of health, do you have any difficulty performing the following  activities: 10/02/2018  Hearing? Y  Vision? N  Difficulty concentrating or making decisions? Y  Walking or climbing stairs? Y  Dressing or bathing? Y  Doing errands, shopping? Y  Preparing Food and eating ? Y  Using the Toilet? N  In the past six months, have you accidently leaked urine? N  Do you have problems with loss of bowel control? Y  Managing your Medications? Y  Managing your Finances? Y  Housekeeping or managing your Housekeeping? Y  Some recent data might be hidden    Patient Care Team: Tonia Ghent, MD as PCP - General (Family Medicine) Estrella Myrtle, Nicolasa Ducking, LCSW as Social Worker (Licensed Clinical Social Worker) Laverle Hobby, MD as Consulting Physician (Pulmonary Disease) Rockey Situ, Kathlene November, MD as Consulting Physician (Cardiology) Earnie Larsson, MD as Consulting Physician (Neurosurgery) Jannet Mantis, MD as Consulting Physician (Dermatology)   Assessment:   This is a routine wellness examination for Sidhant.   Hearing Screening   125Hz  250Hz  500Hz  1000Hz  2000Hz  3000Hz  4000Hz  6000Hz  8000Hz   Right ear:           Left ear:           Vision Screening Comments: Vision exam in Feb 2020   Exercise Activities and Dietary recommendations Current Exercise Habits: The patient does not  participate in regular exercise at present, Exercise limited by: None identified  Goals     physical     When weather permits, I will attempt to walk at least 10 min every other day.       Fall Risk Fall Risk  10/02/2018 09/28/2017 07/03/2017 04/24/2017 12/02/2016  Falls in the past year? 1 Yes Yes Yes Yes  Comment - - - - -  Number falls in past yr: 1 2 or more 2 or more 2 or more 2 or more  Injury with Fall? 1 No - No Yes  Risk Factor Category  - High Fall Risk High Fall Risk High Fall Risk High Fall Risk  Risk for fall due to : History of fall(s);Impaired balance/gait;Impaired mobility Impaired balance/gait;History of fall(s);Impaired mobility - - -  Risk for fall due to: Comment - - - - -  Follow up - - Falls evaluation completed Falls evaluation completed Falls evaluation completed   Depression Screen PHQ 2/9 Scores 10/02/2018 10/03/2017 09/28/2017 07/25/2016  PHQ - 2 Score 0 0 0 5  PHQ- 9 Score 8 0 14 15    Cognitive Function MMSE - Mini Mental State Exam 10/02/2018 09/28/2017 07/03/2017 12/02/2016 07/25/2016  Not completed: - - Refused Unable to complete -  Orientation to time 5 5 - - 5  Orientation to Place 5 5 - - 5  Registration 3 3 - - 3  Attention/ Calculation 0 0 - - 0  Recall 3 3 - - 3  Language- name 2 objects 0 0 - - 0  Language- repeat 1 1 - - 1  Language- follow 3 step command 0 3 - - 3  Language- read & follow direction 0 0 - - 0  Write a sentence 0 0 - - 0  Copy design 0 0 - - 0  Total score 17 20 - - 20     PLEASE NOTE: A Mini-Cog screen was completed. Maximum score is 17. A value of 0 denotes this part of [place] MMSE was not completed or the patient failed this part of the Mini-Cog screening.   Mini-Cog Screening Orientation to Time - Max 5 pts Orientation to Place - Max  5 pts Registration - Max 3 pts Recall - Max 3 pts Language Repeat - Max 1 pts   Immunization History  Administered Date(s) Administered   Influenza,inj,Quad PF,6+ Mos 01/17/2014,  01/05/2015, 03/23/2016, 02/23/2017, 02/01/2018   Pneumococcal Conjugate-13 01/05/2015   Pneumococcal Polysaccharide-23 05/03/2017    Screening Tests Health Maintenance  Topic Date Due   COLON CANCER SCREENING ANNUAL FOBT  04/18/2019 (Originally 09/02/2017)   TETANUS/TDAP  07/26/2026 (Originally 06/04/1969)   INFLUENZA VACCINE  11/17/2018   Hepatitis C Screening  Completed   PNA vac Low Risk Adult  Completed   COLONOSCOPY  Discontinued   I have personally reviewed, addressed, and noted the following in the patients chart:  A. Medical and social history B. Use of alcohol, tobacco or illicit drugs  C. Current medications and supplements D. Functional ability and status E.  Nutritional status F.  Physical activity G. Advance directives H. List of other physicians I.  Hospitalizations, surgeries, and ER visits in previous 12 months J.  Vitals (unless it is a telemedicine encounter) K. Screenings to include cognitive, depression, hearing, vision (NOTE: hearing and vision screenings not completed in telemedicine encounter) L. Referrals and appointments   In addition, I have reviewed and discussed with patient certain preventive protocols, quality metrics, and best practice recommendations. A written personalized care plan for preventive services and recommendations were provided to patient.  With patient's permission, we connected on 10/02/18. Interactive audio and video telecommunications were attempted with patient. This attempt was unsuccessful due to patient having technical difficulties OR patient did not have access to video capability.  Encounter was completed with audio only.  Two patient identifiers were used to ensure the encounter occurred with the correct person. Patient was in home and writer was in office.   Signed,   Lindell Noe, MHA, BS, RN Health Coach

## 2018-10-02 NOTE — Progress Notes (Signed)
PCP notes:   Health maintenance:  Colon cancer screening - to be determined  Abnormal screenings:   Fall risk - hx of multiple falls Fall Risk  10/02/2018 09/28/2017 07/03/2017 04/24/2017 12/02/2016  Falls in the past year? 1 Yes Yes Yes Yes  Comment - - - - -  Number falls in past yr: 1 2 or more 2 or more 2 or more 2 or more  Injury with Fall? 1 No - No Yes  Risk Factor Category  - High Fall Risk High Fall Risk High Fall Risk High Fall Risk  Risk for fall due to : History of fall(s);Impaired balance/gait;Impaired mobility Impaired balance/gait;History of fall(s);Impaired mobility - - -  Risk for fall due to: Comment - - - - -  Follow up - - Falls evaluation completed Falls evaluation completed Falls evaluation completed    Patient concerns:   None  Nurse concerns:  None  Next PCP appt:   10/09/18 @ 1415 I reviewed health advisor's note, was available for consultation on the day of service listed in this note, and agree with documentation and plan. Elsie Stain, MD.

## 2018-10-04 ENCOUNTER — Other Ambulatory Visit (INDEPENDENT_AMBULATORY_CARE_PROVIDER_SITE_OTHER): Payer: Medicare PPO

## 2018-10-04 ENCOUNTER — Other Ambulatory Visit: Payer: Self-pay

## 2018-10-04 DIAGNOSIS — M10079 Idiopathic gout, unspecified ankle and foot: Secondary | ICD-10-CM

## 2018-10-04 DIAGNOSIS — I10 Essential (primary) hypertension: Secondary | ICD-10-CM

## 2018-10-04 LAB — COMPREHENSIVE METABOLIC PANEL
ALT: 6 U/L (ref 0–53)
AST: 11 U/L (ref 0–37)
Albumin: 4.3 g/dL (ref 3.5–5.2)
Alkaline Phosphatase: 80 U/L (ref 39–117)
BUN: 24 mg/dL — ABNORMAL HIGH (ref 6–23)
CO2: 29 mEq/L (ref 19–32)
Calcium: 9.3 mg/dL (ref 8.4–10.5)
Chloride: 105 mEq/L (ref 96–112)
Creatinine, Ser: 1.06 mg/dL (ref 0.40–1.50)
GFR: 69.2 mL/min (ref >60.00)
Glucose, Bld: 117 mg/dL — ABNORMAL HIGH (ref 70–99)
Potassium: 4.5 mEq/L (ref 3.5–5.1)
Sodium: 140 mEq/L (ref 135–145)
Total Bilirubin: 0.7 mg/dL (ref 0.2–1.2)
Total Protein: 6.5 g/dL (ref 6.0–8.3)

## 2018-10-04 LAB — CBC WITH DIFFERENTIAL/PLATELET
Basophils Absolute: 0.1 10*3/uL (ref 0.0–0.1)
Basophils Relative: 0.9 % (ref 0.0–3.0)
Eosinophils Absolute: 0.2 10*3/uL (ref 0.0–0.7)
Eosinophils Relative: 2.6 % (ref 0.0–5.0)
HCT: 43.5 % (ref 39.0–52.0)
Hemoglobin: 14.8 g/dL (ref 13.0–17.0)
Lymphocytes Relative: 32.8 % (ref 12.0–46.0)
Lymphs Abs: 2.1 10*3/uL (ref 0.7–4.0)
MCHC: 34.1 g/dL (ref 30.0–36.0)
MCV: 89.3 fl (ref 78.0–100.0)
Monocytes Absolute: 0.5 10*3/uL (ref 0.1–1.0)
Monocytes Relative: 7.4 % (ref 3.0–12.0)
Neutro Abs: 3.6 10*3/uL (ref 1.4–7.7)
Neutrophils Relative %: 56.3 % (ref 43.0–77.0)
Platelets: 187 10*3/uL (ref 150.0–400.0)
RBC: 4.87 Mil/uL (ref 4.22–5.81)
RDW: 13.4 % (ref 11.5–15.5)
WBC: 6.3 10*3/uL (ref 4.0–10.5)

## 2018-10-04 LAB — LIPID PANEL
Cholesterol: 140 mg/dL (ref 0–200)
HDL: 33.6 mg/dL — ABNORMAL LOW (ref >39.00)
LDL Cholesterol: 76 mg/dL (ref 0–99)
NonHDL: 105.95
Total CHOL/HDL Ratio: 4
Triglycerides: 152 mg/dL — ABNORMAL HIGH (ref 0.0–149.0)
VLDL: 30.4 mg/dL (ref 0.0–40.0)

## 2018-10-04 LAB — URIC ACID: Uric Acid, Serum: 6.8 mg/dL (ref 4.0–7.8)

## 2018-10-09 ENCOUNTER — Ambulatory Visit (INDEPENDENT_AMBULATORY_CARE_PROVIDER_SITE_OTHER): Payer: Medicare PPO | Admitting: Family Medicine

## 2018-10-09 ENCOUNTER — Encounter: Payer: Self-pay | Admitting: Family Medicine

## 2018-10-09 ENCOUNTER — Other Ambulatory Visit: Payer: Self-pay

## 2018-10-09 VITALS — BP 100/72 | HR 52 | Temp 98.3°F | Ht 68.0 in | Wt 187.0 lb

## 2018-10-09 DIAGNOSIS — E782 Mixed hyperlipidemia: Secondary | ICD-10-CM | POA: Diagnosis not present

## 2018-10-09 DIAGNOSIS — G20 Parkinson's disease: Secondary | ICD-10-CM

## 2018-10-09 DIAGNOSIS — I10 Essential (primary) hypertension: Secondary | ICD-10-CM

## 2018-10-09 DIAGNOSIS — W19XXXA Unspecified fall, initial encounter: Secondary | ICD-10-CM

## 2018-10-09 DIAGNOSIS — Z1211 Encounter for screening for malignant neoplasm of colon: Secondary | ICD-10-CM

## 2018-10-09 DIAGNOSIS — F339 Major depressive disorder, recurrent, unspecified: Secondary | ICD-10-CM

## 2018-10-09 DIAGNOSIS — Z0000 Encounter for general adult medical examination without abnormal findings: Secondary | ICD-10-CM

## 2018-10-09 DIAGNOSIS — Z7189 Other specified counseling: Secondary | ICD-10-CM

## 2018-10-09 MED ORDER — ESCITALOPRAM OXALATE 10 MG PO TABS: 10.0000 mg | ORAL_TABLET | Freq: Every day | ORAL | 3 refills | Status: DC

## 2018-10-09 MED ORDER — LOSARTAN POTASSIUM 50 MG PO TABS: 25.0000 mg | ORAL_TABLET | Freq: Every day | ORAL | Status: DC

## 2018-10-09 MED ORDER — CARBIDOPA-LEVODOPA 25-100 MG PO TABS: 1.0000 | ORAL_TABLET | Freq: Three times a day (TID) | ORAL | Status: DC

## 2018-10-09 NOTE — Progress Notes (Signed)
D/w patient [id number] for colon cancer screening, including IFOB vs. colonoscopy.  Risks and benefits of both were discussed and patient voiced understanding.  Pt elects XYB:[id number].   Prostate cancer screening and PSA options (with potential risks and benefits of testing vs not testing) were discussed along with recent recs/guidelines.  He declined testing PSA at this point. Flu shot yearly.  PNA up to date.  Tetanus shot may be cheaper at pharmacy.   shingrix d/w pt.  May be cheaper at pharmacy.   Advance directive- wife designated if patient were incapacitated.   Hypertension:    Using medication without problems or lightheadedness: not usually lightheaded.  He didn't know if lower BP was contributing to falls.   Chest pain with exertion: positional, with stretching, but not exertional Edema:trace Short of breath: some, with likely deconditioning noted.   Labs d/w pt.   Elevated Cholesterol: Using medications without problems: yes Muscle aches: no Diet compliance: encouraged Exercise: limited.    PD.  Per neuro.  Using walker/Ustep.  Fall cautions d/w pt. No recent injury.  He is awaiting disability hearing.  He has an aid coming in 14 hours per week.  He has more dysphagia, cleared with a cough, with liquids and solids.  He has f/u with neurology pending.  Aspiration cautions d/w pt.  He has ST per [place].    MDD.  Mood d/w pt.  "Mood is better than it has been".  Wife agreed that his mood was better.  Compliant.     PMH and SH reviewed  ROS: Per HPI unless specifically indicated in ROS section   Meds, vitals, and allergies reviewed.   GEN: nad, alert and oriented HEENT: ncat Speech slightly slow but at baseline NECK: supple w/o LA CV: rrr.   PULM: ctab, no inc wob ABD: soft, +bs EXT: no edema SKIN: no acute rash but small abrasion on the L scapular area noted from fall earlier today Minimal tremor. Walking with walker.

## 2018-10-09 NOTE — Patient Instructions (Signed)
Try cutting losartan back to 25mg  a day, 1/2 tab a day.  You can go back to 1 tab a day if needed.   I would try that first and then follow up with neurology.   Take care.  Glad to see you.

## 2018-10-10 NOTE — Assessment & Plan Note (Signed)
Labs discussed with patient.  Continue pravastatin.  No change in meds.

## 2018-10-10 NOTE — Assessment & Plan Note (Signed)
See above.  No change in meds.  He has home aide.  Discussed speech therapy and follow-up with the [place].  He will update me as needed.

## 2018-10-10 NOTE — Assessment & Plan Note (Signed)
D/w patient BR:[id number] for colon cancer screening, including IFOB vs. colonoscopy.  Risks and benefits of both were discussed and patient voiced understanding.  Pt elects WKG:[id number].   Prostate cancer screening and PSA options (with potential risks and benefits of testing vs not testing) were discussed along with recent recs/guidelines.  He declined testing PSA at this point. Flu shot yearly.  PNA up to date.  Tetanus shot may be cheaper at pharmacy.   shingrix d/w pt.  May be cheaper at pharmacy.   Advance directive- wife designated if patient were incapacitated.

## 2018-10-10 NOTE — Assessment & Plan Note (Signed)
Decrease losartan to 25 mg a day and update me as needed.  Unclear if this is contributing to his fall risk.  Likely reasonable to try tapering losartan first.

## 2018-10-10 NOTE — Assessment & Plan Note (Signed)
Mode is better than it has been.  No change in meds.  Continue as is.  He agrees.

## 2018-10-10 NOTE — Assessment & Plan Note (Signed)
Fall cautions discussed with patient.  Continue using walker.  He is following up with neurology about medication adjustments.  Nonstick dressing for superficial abrasion noted near left scapula.  See hypertension discussion.

## 2018-10-10 NOTE — Assessment & Plan Note (Signed)
Advance directive- wife designated if patient were incapacitated.  

## 2018-10-11 NOTE — Addendum Note (Signed)
Addended by: Josetta Huddle on: 10/11/2018 11:37 AM   Modules accepted: Orders

## 2018-10-15 ENCOUNTER — Ambulatory Visit (INDEPENDENT_AMBULATORY_CARE_PROVIDER_SITE_OTHER): Payer: Medicare PPO | Admitting: Psychology

## 2018-10-15 DIAGNOSIS — F4323 Adjustment disorder with mixed anxiety and depressed mood: Secondary | ICD-10-CM | POA: Diagnosis not present

## 2018-10-15 DIAGNOSIS — F331 Major depressive disorder, recurrent, moderate: Secondary | ICD-10-CM | POA: Diagnosis not present

## 2018-10-16 DIAGNOSIS — Z9689 Presence of other specified functional implants: Secondary | ICD-10-CM | POA: Diagnosis not present

## 2018-10-16 DIAGNOSIS — G20 Parkinson's disease: Secondary | ICD-10-CM | POA: Diagnosis not present

## 2018-10-16 DIAGNOSIS — R2689 Other abnormalities of gait and mobility: Secondary | ICD-10-CM | POA: Diagnosis not present

## 2018-10-16 DIAGNOSIS — R498 Other voice and resonance disorders: Secondary | ICD-10-CM | POA: Diagnosis not present

## 2018-10-23 ENCOUNTER — Other Ambulatory Visit: Payer: Self-pay

## 2018-10-23 ENCOUNTER — Ambulatory Visit: Payer: Medicare PPO | Attending: Physician Assistant

## 2018-10-23 DIAGNOSIS — R278 Other lack of coordination: Secondary | ICD-10-CM | POA: Diagnosis not present

## 2018-10-23 DIAGNOSIS — R2689 Other abnormalities of gait and mobility: Secondary | ICD-10-CM | POA: Diagnosis not present

## 2018-10-23 DIAGNOSIS — R2681 Unsteadiness on feet: Secondary | ICD-10-CM | POA: Diagnosis not present

## 2018-10-23 DIAGNOSIS — M6281 Muscle weakness (generalized): Secondary | ICD-10-CM

## 2018-10-23 NOTE — Therapy (Signed)
[place] MAIN [facility] SERVICES [address] [place], [place], [address] Phone: [phone]   Fax:  [phone]  Physical Therapy Evaluation  Patient Details  Name: [name] MRN: [id number] Date of Birth: 1950/06/12 Referring Provider (PT): Gillermo Murdoch, NP   Encounter Date: 10/23/2018  PT End of Session - 10/23/18 1234    Visit Number  1    Number of Visits  16    Date for PT Re-Evaluation  12/18/18    Authorization Type  1/10 eval start 10/23/18    PT Start Time  0800    PT Stop Time  0854    PT Time Calculation (min)  54 min    Equipment Utilized During Treatment  Gait belt;Back brace    Activity Tolerance  Patient tolerated treatment well    Behavior During Therapy  [facility] for tasks assessed/performed       Past Medical History:  Diagnosis Date  . Arthritis   . Bradycardia   . Cancer [facility]) 2013   skin cancer  . Depression    ptsd  . Dysrhythmia    chronic slow heart rate  . GERD (gastroesophageal reflux disease)   . Headache(784.0)    tension headaches non recent  . History of chicken pox   . History of kidney stones    passed  . Hypertension    treated with HCTZ  . Parkinson's disease ([place])    dx'ed 15 years ago  . PTSD (post-traumatic stress disorder)   . Shortness of breath dyspnea   . Sleep apnea    doesn't use C-pap  . Varicose veins     Past Surgical History:  Procedure Laterality Date  . CHOLECYSTECTOMY N/A 10/22/2014   Procedure: LAPAROSCOPIC CHOLECYSTECTOMY WITH INTRAOPERATIVE CHOLANGIOGRAM;  Surgeon: Dia Crawford III, MD;  Location: [facility] ORS;  Service: General;  Laterality: N/A;  . cyst removed      from lip as a child  . LUMBAR LAMINECTOMY/DECOMPRESSION MICRODISCECTOMY Bilateral 12/14/2012   Procedure: Bilateral lumbar three-four, four-five decompressive laminotomy/foraminotomy;  Surgeon: Charlie Pitter, MD;  Location: [place] NEURO ORS;  Service: Neurosurgery;  Laterality: Bilateral;  . PULSE GENERATOR IMPLANT  Bilateral 12/13/2013   Procedure: Bilateral implantable pulse generator placement;  Surgeon: Erline Levine, MD;  Location: [place] NEURO ORS;  Service: Neurosurgery;  Laterality: Bilateral;  Bilateral implantable pulse generator placement  . skin cancer removed     from ears,   12 lft arm  rt leg 15  . SUBTHALAMIC STIMULATOR BATTERY REPLACEMENT Bilateral 07/14/2017   Procedure: BILATERAL IMPLANTED PULSE GENERATOR CHANGE FOR DEEP BRAIN STIMULATOR;  Surgeon: Erline Levine, MD;  Location: [place];  Service: Neurosurgery;  Laterality: Bilateral;  . SUBTHALAMIC STIMULATOR INSERTION Bilateral 12/06/2013   Procedure: SUBTHALAMIC STIMULATOR INSERTION;  Surgeon: Erline Levine, MD;  Location: [place] NEURO ORS;  Service: Neurosurgery;  Laterality: Bilateral;  Bilateral deep brain stimulator placement    There were no vitals filed for this visit.   Subjective Assessment - 10/23/18 0809    Subjective  Patient is a pleasant 68 year old male who presents for for Parkinson's.    Pertinent History  Patient is a pleasant 68 year old male who presents for for Parkinson's. Patient is awaiting disability hearing. He has an aide 14 hours/week and uses a walker/u step at baseline. Patient has been seen for physical therapy at this clinic last year and was discharged to home health due to increasing fall risk and limited ability to negotiate home environment.  At time of discharge this physical therapist was advised to not encourage patient to ambulate and to use wheelchair by referring physician. Patient has since switched physicians to a neurosurgeon at [facility] who altered his deep brain stimulator with positive effects in January and re-adjusted it again about a month ago. Is now allowed to walk and encouraged to do so as frequently as possible. Patient continues to have balance deficits having approximately 5-6 falls a month. PMH includes: Parkinson's, HTN, GERD, HLD, spinal stenosis, PTSD, Deep brain stimulator placement, cancer, and  bradycardia.    Limitations  Walking;Standing;Sitting;House hold activities;Lifting    How long can you sit comfortably?  hours    How long can you stand comfortably?  3 minutes but varies    How long can you walk comfortably?  1-2 minutes    Patient Stated Goals  to stand and walk for longer distances for exercises.    Currently in Pain?  No/denies         Patient is a pleasant 68 year old male who presents for for Parkinson's. Patient is awaiting disability hearing. He has an aide 14 hours/week and uses a walker/u step at baseline. Patient has been seen for physical therapy at this clinic last year and was discharged to home health due to increasing fall risk and limited ability to negotiate home environment.  At time of discharge this physical therapist was advised to not encourage patient to ambulate and to use wheelchair by referring physician. Patient has since switched physicians to a neurosurgeon at [facility] who altered his deep brain stimulator with positive effects in January and re-adjusted it again about a month ago. Is now allowed to walk and encouraged to do so as frequently as possible. Patient continues to have balance deficits having approximately 5-6 falls a month. PMH includes: Parkinson's, HTN, GERD, HLD, spinal stenosis, PTSD, Deep brain stimulator placement, cancer, and bradycardia.    [facility] PT Assessment - 10/23/18 0001      Assessment   Medical Diagnosis  Parkinson's Disease    Referring Provider (PT)  Gillermo Murdoch, NP    Onset Date/Surgical Date  --   approximately 2002   Hand Dominance  Right    Prior Therapy  Yes, multiple bouts      Precautions   Precautions  Fall    Precaution Comments  --   wears lumbar corset brace     Restrictions   Weight Bearing Restrictions  No      Balance Screen   Has the patient fallen in the past 6 months  Yes    How many times?  15    Has the patient had a decrease in activity level because of a fear of falling?   No    Is the  patient reluctant to leave their home because of a fear of falling?   Yes      [place] residence    Living Arrangements  Spouse/significant other    Available Help at Discharge  Family    Type of [place]  One level    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   Standardized Balance Assessment  Berg Balance Test      Berg Balance Test   Sit to Stand  Able to stand  independently using hands    Standing Unsupported  Able to stand 30 seconds unsupported    Sitting with Back Unsupported but Feet Supported on Floor or Stool  Able to sit safely and securely 2 minutes    Stand to Sit  Uses backs of legs against chair to control descent    Transfers  Able to transfer with verbal cueing and /or supervision    Standing Unsupported with Eyes Closed  Able to stand 3 seconds    Standing Unsupported with Feet Together  Able to place feet together independently but unable to hold for 30 seconds    From Standing, Reach Forward with Outstretched Arm  Can reach forward >5 cm safely (2")    From Standing Position, Pick up Object from Floor  Unable to pick up shoe, but reaches 2-5 cm (1-2") from shoe and balances independently    From Standing Position, Turn to Look Behind Over each Shoulder  Turn sideways only but maintains balance    Turn 360 Degrees  Needs close supervision or verbal cueing    Standing Unsupported, Alternately Place Feet on Step/Stool  Needs assistance to keep from falling or unable to try    Standing Unsupported, One Foot in Front  Needs help to step but can hold 15 seconds    Standing on One Leg   Unable to try or needs assist to prevent fall    Total Score  25        PAIN: Back pain: lumbar   occasionally results in giving out on R side, progressing to left.  Worst back pain: 7/10   POSTURE: Crosses legs, leans to left  PROM/AROM: Limited hip flexor length bilaterally  STRENGTH:  Graded on a 0-5 scale Muscle Group Left Right  Hip Flex 4-/5 3/5  Hip Abd 3/5 2+/5  Hip Add 3/5 2+/5  Hip Ext 2+/5 2/5  Hip IR/ER    Knee Flex 4/5 4-/5  Knee Ext 4/5 4-/5  Ankle DF 3+/5 3/5  Ankle PF 3+/5 3/5    SENSATION: Patient reports sensation equal  SPECIAL TESTS/ COORDINATION: Finger to nose test: Dysmetria bilaterally Heel shin test: able to keep foot on shin with LLE, not able to maintain contact with RLE Babinski test-  FUNCTIONAL MOBILITY: STS : 20 seconds, posterior LOB with final STS, requires use of UE for transfer   BALANCE: Dynamic Sitting Balance  Normal Able to sit unsupported and weight shift across midline maximally   Good Able to sit unsupported and weight shift across midline moderately   Good-/Fair+ Able to sit unsupported and weight shift across midline minimally   Fair Minimal weight shifting ipsilateral/front, difficulty crossing midline   Fair- Reach to ipsilateral side and unable to weight shift x  Poor + Able to sit unsupported with min A and reach to ipsilateral side, unable to weight shift   Poor Able to sit unsupported with mod A and reach ipsilateral/front-can't cross midline     Standing Dynamic Balance  Normal Stand independently unsupported, able to weight shift and cross midline maximally   Good Stand independently unsupported, able to weight shift and cross midline moderately   Good-/Fair+ Stand independently unsupported, able to weight shift across midline minimally   Fair Stand independently unsupported, weight shift, and reach  ipsilaterally, loss of balance when crossing midline   Poor+ Able to stand with Min A and reach ipsilaterally,  unable to weight shift x  Poor Able to stand with Mod A and minimally reach ipsilaterally, unable to cross midline.    Static Sitting Balance  Normal Able to maintain balance against maximal resistance   Good Able to maintain balance against moderate resistance   Good-/Fair+ Accepts minimal resistance x  Fair Able to sit unsupported without balance loss and without UE support   Poor+ Able to maintain with Minimal assistance from individual or chair   Poor Unable to maintain balance-requires mod/max support from individual or chair     Static Standing Balance  Normal Able to maintain standing balance against maximal resistance   Good Able to maintain standing balance against moderate resistance   Good-/Fair+ Able to maintain standing balance against minimal resistance   Fair Able to stand unsupported without UE support and without LOB for 1-2 min x  Fair- Requires Min A and UE support to maintain standing without loss of balance   Poor+ Requires mod A and UE support to maintain standing without loss of balance   Poor Requires max A and UE support to maintain standing balance without loss      GAIT: Ambulates with U walker with bilateral knee flexion;  L knee buckling > R, R knee hyperextension with fatigue upon weightbearing.  OUTCOME MEASURES: TEST Outcome Interpretation  5 times sit<>stand 20 sec >60 yo, >15 sec indicates increased risk for falls  10 meter walk test         17 seconds with bilateral knee flexion  With U step walker:   0.58    m/s <1.0 m/s indicates increased risk for falls; limited community ambulator  ABC 34.1%       Berg Balance Assessment 25/56  <36/56 (100% risk for falls), 37-45 (80% risk for falls); 46-51 (>50% risk for falls); 52-55 (lower risk <25% of falls)         Patient is a pleasant 68 year old male who presents to physical therapy for evaluation of Parkinson's. Patient has had recent alteration to deep brain stimulator allowing for him to walk  again. Per patient and patient's physician patient will benefit from balance and mobility training to increase strength and decrease falls. Patient has occasional posterior trunk LOB with transfers and bilateral knee flexion with ambulation with occasional hyperextension of R knee with weight bearing. Patient will benefit from skilled physical therapy to increase stability, strength, mobility, and posture to decrease falls risk and improve quality of life.    Objective measurements completed on examination: See above findings.     Access Code: [facility]  URL: [email or web address]  Date: 10/23/2018  Prepared by: Janna Arch   Exercises Seated Gluteal Sets - 10 reps - 2 sets - 5 hold - 1x daily - 7x weekly Standing Hip Extension with Counter Support - 10 reps - 2 sets - 5 hold - 1x daily - 7x weekly Seated Ankle Alphabet - 10 reps - 2 sets - 5 hold - 1x daily - 7x weekly          PT Education - 10/23/18 1234    Education provided  Yes    Education Details  goals, POC, HEP    Person(s) Educated  Patient;Spouse    Methods  Explanation;Demonstration;Tactile cues;Verbal cues;Handout    Comprehension  Verbalized understanding;Returned demonstration;Verbal cues required;Tactile cues required       PT Short Term Goals -  10/23/18 1243      PT SHORT TERM GOAL #1   Title  Pt will perform HEP with family's supervision, for improved balance, transfers, and gait.      Baseline  7/7 HEP given    Time  2    Period  Weeks    Status  New    Target Date  11/06/18        PT Long Term Goals - 10/23/18 1509      PT LONG TERM GOAL #1   Title  Patient (> 17 years old) will complete five times sit to stand test in < 15 seconds indicating an increased LE strength and improved balance.    Baseline  7/7: 20 seconds with UE support and one posterior LOB    Time  8    Period  Weeks    Status  New    Target Date  12/18/18      PT LONG TERM GOAL #2   Title  Patient will  increase 10 meter walk test to >1.17m/s as to improve gait speed for better community ambulation and to reduce fall risk.    Baseline  7/7: .58 m/s with U step walker    Time  8    Period  Weeks    Status  New    Target Date  12/18/18      PT LONG TERM GOAL #3   Title  Patient will increase Berg Balance score by > 6 points (31/56) to demonstrate decreased fall risk during functional activities.    Baseline  7/7: 25/56    Time  8    Period  Weeks    Status  New    Target Date  12/18/18      PT LONG TERM GOAL #4   Title  Patient will deny any falls over past 4 weeks to demonstrate improved safety awareness at home and in the community.    Baseline  7/7: averages 6 falls/month    Time  8    Period  Weeks    Status  New    Target Date  12/18/18             Plan - 10/23/18 1238    Clinical Impression Statement  Patient is a pleasant 68 year old male who presents to physical therapy for evaluation of Parkinson's. Patient has had recent alteration to deep brain stimulator allowing for him to walk again. Per patient and patient's physician patient will benefit from balance and mobility training to increase strength and decrease falls. Patient has occasional posterior trunk LOB with transfers and bilateral knee flexion with ambulation with occasional hyperextension of R knee with weight bearing. Patient will benefit from skilled physical therapy to increase stability, strength, mobility, and posture to decrease falls risk and improve quality of life.    Personal Factors and Comorbidities  Age;Comorbidity 3+;Past/Current Experience;Time since onset of injury/illness/exacerbation    Comorbidities  athritis, bradycardia, cancer, depression, dysrhythmia, GERD, HTN, Parkinson's, PTSD, SOB, sleep apnea    Examination-Activity Limitations  Bathing;Bed Mobility;Caring for Others;Bend;Dressing;Lift;Locomotion Level;Reach Overhead;Sit;Squat;Stairs;Stand;Toileting;Transfers     Examination-Participation Restrictions  Church;Cleaning;Community Activity;Interpersonal Relationship;Shop;Volunteer;Yard Work;Other    Stability/Clinical Decision Making  Unstable/Unpredictable    Clinical Decision Making  High    Clinical Presentation due to:  progressive neurodegenerative disorder, recurrent falls, highly variable presentation daily based on DBS, highly variable low back pain    Rehab Potential  Fair    PT Frequency  2x / week  PT Duration  8 weeks    PT Treatment/Interventions  ADLs/Self Care Home Management;Aquatic Therapy;Cryotherapy;Electrical Stimulation;Iontophoresis 4mg /ml Dexamethasone;Moist Heat;Ultrasound;Contrast Bath;DME Instruction;Gait training;Stair training;Functional mobility training;Therapeutic activities;Therapeutic exercise;Balance training;Neuromuscular re-education;Patient/family education;Manual techniques;Wheelchair mobility training;Energy conservation;Passive range of motion;Traction;Orthotic Fit/Training    PT Next Visit Plan  review HEP, airex pad, dynamic stability    PT Home Exercise Plan  see above    Consulted and Agree with Plan of Care  Patient;Family member/caregiver    Family Member Consulted  wife       Patient will benefit from skilled therapeutic intervention in order to improve the following deficits and impairments:  Abnormal gait, Decreased balance, Decreased coordination, Decreased mobility, Impaired tone, Postural dysfunction, Decreased strength, Decreased safety awareness, Improper body mechanics, Impaired flexibility, Decreased activity tolerance, Decreased endurance, Decreased knowledge of precautions, Difficulty walking, Pain  Visit Diagnosis: 1. Other abnormalities of gait and mobility   2. Unsteadiness on feet   3. Muscle weakness (generalized)        Problem List Patient Active Problem List   Diagnosis Date Noted  . Rib pain on right side 09/25/2018  . Skin sore 12/01/2017  . Hematoma 10/18/2017  . Accidental  fall 10/18/2017  . 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 Radicular pain in right arm 10/09/2016  . GERD (gastroesophageal reflux disease) 07/31/2016  . Trochanteric bursitis 03/03/2016  . Left hand pain 10/30/2015  . Fracture, finger, multiple sites 10/30/2015  . Colon cancer screening 07/23/2015  . Healthcare maintenance 07/23/2015  . Gout 02/18/2015  . Depression 01/06/2015  . Irritation of eyelid 08/20/2014  . Dupuytren's contracture 08/20/2014  . Cough 07/21/2014  . Lumbar stenosis with neurogenic claudication 06/13/2014  . Preop cardiovascular exam 05/28/2014  . S/P deep brain stimulator placement 05/08/2014  . Joint pain 01/22/2014  . Medicare annual wellness visit, initial 01/19/2014  . Advance care planning 01/19/2014  . Parkinson's disease ([place]) 12/13/2013  . PTSD (post-traumatic stress disorder) 06/13/2013  . Erectile dysfunction 06/13/2013  . HLD (hyperlipidemia) 06/13/2013  . Hip pain 06/10/2013  . Pain in joint, shoulder region 06/10/2013  . Right leg swelling 06/03/2013  . Essential hypertension 06/03/2013  . Bradycardia by electrocardiogram 06/03/2013  . Obstructive sleep apnea 03/12/2013  . Spinal stenosis, lumbar region, with neurogenic claudication 12/14/2012   Janna Arch, PT, DPT   10/23/2018, 5:08 PM  [place] MAIN [facility] SERVICES [address] [place], [place], [address] Phone: [phone]   Fax:  [phone]  Name: [name] MRN: [id number] Date of Birth: [birth year]

## 2018-10-23 NOTE — Patient Instructions (Signed)
Access Code: [facility]  URL: [email or web address]  Date: 10/23/2018  Prepared by: Janna Arch   Exercises Seated Gluteal Sets - 10 reps - 2 sets - 5 hold - 1x daily - 7x weekly Standing Hip Extension with Counter Support - 10 reps - 2 sets - 5 hold - 1x daily - 7x weekly Seated Ankle Alphabet - 10 reps - 2 sets - 5 hold - 1x daily - 7x weekly

## 2018-10-26 ENCOUNTER — Ambulatory Visit: Payer: Medicare PPO

## 2018-10-29 ENCOUNTER — Other Ambulatory Visit: Payer: Self-pay

## 2018-10-29 ENCOUNTER — Ambulatory Visit: Payer: Medicare PPO

## 2018-10-29 ENCOUNTER — Ambulatory Visit (INDEPENDENT_AMBULATORY_CARE_PROVIDER_SITE_OTHER): Payer: Medicare PPO | Admitting: Psychology

## 2018-10-29 DIAGNOSIS — R2681 Unsteadiness on feet: Secondary | ICD-10-CM | POA: Diagnosis not present

## 2018-10-29 DIAGNOSIS — F331 Major depressive disorder, recurrent, moderate: Secondary | ICD-10-CM

## 2018-10-29 DIAGNOSIS — M6281 Muscle weakness (generalized): Secondary | ICD-10-CM | POA: Diagnosis not present

## 2018-10-29 DIAGNOSIS — R2689 Other abnormalities of gait and mobility: Secondary | ICD-10-CM | POA: Diagnosis not present

## 2018-10-29 DIAGNOSIS — F4323 Adjustment disorder with mixed anxiety and depressed mood: Secondary | ICD-10-CM | POA: Diagnosis not present

## 2018-10-29 DIAGNOSIS — R278 Other lack of coordination: Secondary | ICD-10-CM | POA: Diagnosis not present

## 2018-10-29 NOTE — Therapy (Signed)
[place] MAIN [facility] SERVICES [address] [place], [place], [address] Phone: [phone]   Fax:  [phone]  Physical Therapy Treatment  Patient Details  Name: [name] MRN: [id number] Date of Birth: 09-22-1950 Referring Provider (PT): Gillermo Murdoch, NP   Encounter Date: 10/29/2018  PT End of Session - 10/29/18 0910    Visit Number  2    Number of Visits  16    Date for PT Re-Evaluation  12/18/18    Authorization Type  2/10 eval start 10/23/18    PT Start Time  0845    PT Stop Time  0929    PT Time Calculation (min)  44 min    Equipment Utilized During Treatment  Gait belt;Back brace    Activity Tolerance  Patient tolerated treatment well    Behavior During Therapy  [facility] for tasks assessed/performed       Past Medical History:  Diagnosis Date  . Arthritis   . Bradycardia   . Cancer [facility]) 2013   skin cancer  . Depression    ptsd  . Dysrhythmia    chronic slow heart rate  . GERD (gastroesophageal reflux disease)   . Headache(784.0)    tension headaches non recent  . History of chicken pox   . History of kidney stones    passed  . Hypertension    treated with HCTZ  . Parkinson's disease ([place])    dx'ed 15 years ago  . PTSD (post-traumatic stress disorder)   . Shortness of breath dyspnea   . Sleep apnea    doesn't use C-pap  . Varicose veins     Past Surgical History:  Procedure Laterality Date  . CHOLECYSTECTOMY N/A 10/22/2014   Procedure: LAPAROSCOPIC CHOLECYSTECTOMY WITH INTRAOPERATIVE CHOLANGIOGRAM;  Surgeon: Dia Crawford III, MD;  Location: [facility] ORS;  Service: General;  Laterality: N/A;  . cyst removed      from lip as a child  . LUMBAR LAMINECTOMY/DECOMPRESSION MICRODISCECTOMY Bilateral 12/14/2012   Procedure: Bilateral lumbar three-four, four-five decompressive laminotomy/foraminotomy;  Surgeon: Charlie Pitter, MD;  Location: [place] NEURO ORS;  Service: Neurosurgery;  Laterality: Bilateral;  . PULSE GENERATOR IMPLANT  Bilateral 12/13/2013   Procedure: Bilateral implantable pulse generator placement;  Surgeon: Erline Levine, MD;  Location: [place] NEURO ORS;  Service: Neurosurgery;  Laterality: Bilateral;  Bilateral implantable pulse generator placement  . skin cancer removed     from ears,   12 lft arm  rt leg 15  . SUBTHALAMIC STIMULATOR BATTERY REPLACEMENT Bilateral 07/14/2017   Procedure: BILATERAL IMPLANTED PULSE GENERATOR CHANGE FOR DEEP BRAIN STIMULATOR;  Surgeon: Erline Levine, MD;  Location: [place];  Service: Neurosurgery;  Laterality: Bilateral;  . SUBTHALAMIC STIMULATOR INSERTION Bilateral 12/06/2013   Procedure: SUBTHALAMIC STIMULATOR INSERTION;  Surgeon: Erline Levine, MD;  Location: [place] NEURO ORS;  Service: Neurosurgery;  Laterality: Bilateral;  Bilateral deep brain stimulator placement    There were no vitals filed for this visit.  Subjective Assessment - 10/29/18 0849    Subjective  Patient reports ocmpliance with HEP. Is getting occasional pain in both shoulder blades when standing up. No falls since last session.    Pertinent History  Patient is a pleasant 68 year old male who presents for for Parkinson's. Patient is awaiting disability hearing. He has an aide 14 hours/week and uses a walker/u step at baseline. Patient has been seen for physical therapy at this clinic last year and was discharged to home health due to increasing fall risk and  limited ability to negotiate home environment.  At time of discharge this physical therapist was advised to not encourage patient to ambulate and to use wheelchair by referring physician. Patient has since switched physicians to a neurosurgeon at [facility] who altered his deep brain stimulator with positive effects in January and re-adjusted it again about a month ago. Is now allowed to walk and encouraged to do so as frequently as possible. Patient continues to have balance deficits having approximately 5-6 falls a month. PMH includes: Parkinson's, HTN, GERD, HLD, spinal  stenosis, PTSD, Deep brain stimulator placement, cancer, and bradycardia.    Limitations  Walking;Standing;Sitting;House hold activities;Lifting    How long can you sit comfortably?  hours    How long can you stand comfortably?  3 minutes but varies    How long can you walk comfortably?  1-2 minutes    Patient Stated Goals  to stand and walk for longer distances for exercises.    Currently in Pain?  Yes    Pain Score  3     Pain Location  Scapula    Pain Orientation  Right;Left    Pain Descriptors / Indicators  Aching    Pain Type  Acute pain    Pain Onset  In the past 7 days    Pain Frequency  Intermittent    Aggravating Factors   standing up          Neuro Re-ed Airex pad: eyes open: ankle sway 30 seconds airex pad: eyes closed: first attempt LOB, second attempt with tactile cueing for abdominal activation no LOB, 2x 30 seconds airex pad: modified tandem stance 2x 30 seconds each LE, more challenging with RLE back, cueing for  Step over orange hurdle and back BUE support 10x each LE  Ambulate with U walker around gym searching for 7 cones hidden throughout gym to mimic dual tasking with one LOB.  Opposite arm leg raises each LE decreasing 5,4,3,2,1 with recipricating pattern at 1   TherEx: 3lb ankle weights: second set 4lb ankle weights; Seated LAQ 10x each, cueing for decreasing velocity and increasing hold of muscle contraction. 2 sets total   4lb ankle weights, standing hip flexion marches 10x each LE, CGA  Ambulate 200 ft with U walker and CGA, cueing for increasing BOS to decrease scissoring ambulatory pattern.    Pt educated throughout session about proper posture and technique with exercises. Improved exercise technique, movement at target joints, use of target muscles after min to mod verbal, visual, tactile cues.                       PT Education - 10/29/18 0910    Education provided  Yes    Education Details  exercise technique, body  mechancis, stability    Person(s) Educated  Patient    Methods  Explanation;Demonstration;Tactile cues;Verbal cues    Comprehension  Verbalized understanding;Returned demonstration;Verbal cues required;Tactile cues required       PT Short Term Goals - 10/23/18 1243      PT SHORT TERM GOAL #1   Title  Pt will perform HEP with family's supervision, for improved balance, transfers, and gait.      Baseline  7/7 HEP given    Time  2    Period  Weeks    Status  New    Target Date  11/06/18        PT Long Term Goals - 10/23/18 1509      PT LONG  TERM GOAL #1   Title  Patient (> 69 years old) will complete five times sit to stand test in < 15 seconds indicating an increased LE strength and improved balance.    Baseline  7/7: 20 seconds with UE support and one posterior LOB    Time  8    Period  Weeks    Status  New    Target Date  12/18/18      PT LONG TERM GOAL #2   Title  Patient will increase 10 meter walk test to >1.44m/s as to improve gait speed for better community ambulation and to reduce fall risk.    Baseline  7/7: .58 m/s with U step walker    Time  8    Period  Weeks    Status  New    Target Date  12/18/18      PT LONG TERM GOAL #3   Title  Patient will increase Berg Balance score by > 6 points (31/56) to demonstrate decreased fall risk during functional activities.    Baseline  7/7: 25/56    Time  8    Period  Weeks    Status  New    Target Date  12/18/18      PT LONG TERM GOAL #4   Title  Patient will deny any falls over past 4 weeks to demonstrate improved safety awareness at home and in the community.    Baseline  7/7: averages 6 falls/month    Time  8    Period  Weeks    Status  New    Target Date  12/18/18            Plan - 10/29/18 8341    Clinical Impression Statement  Patient's stability is limited by strength of BLE, with noticeable knee flexion bilaterally with fatigue. Patient is eager to participate and progress strength and stability. He  demonstrates scissoring pattern of ambulation with decreasing stability with prolonged mobility. Dual task continues to be challenging to patient at this time.  Patient will benefit from skilled physical therapy to increase stability, strength, mobility, and posture to decrease falls risk and improve quality of life.    Personal Factors and Comorbidities  Age;Comorbidity 3+;Past/Current Experience;Time since onset of injury/illness/exacerbation    Comorbidities  athritis, bradycardia, cancer, depression, dysrhythmia, GERD, HTN, Parkinson's, PTSD, SOB, sleep apnea    Examination-Activity Limitations  Bathing;Bed Mobility;Caring for Others;Bend;Dressing;Lift;Locomotion Level;Reach Overhead;Sit;Squat;Stairs;Stand;Toileting;Transfers    Examination-Participation Restrictions  Church;Cleaning;Community Activity;Interpersonal Relationship;Shop;Volunteer;Yard Work;Other    Stability/Clinical Decision Making  Unstable/Unpredictable    Rehab Potential  Fair    PT Frequency  2x / week    PT Duration  8 weeks    PT Treatment/Interventions  ADLs/Self Care Home Management;Aquatic Therapy;Cryotherapy;Electrical Stimulation;Iontophoresis 4mg /ml Dexamethasone;Moist Heat;Ultrasound;Contrast Bath;DME Instruction;Gait training;Stair training;Functional mobility training;Therapeutic activities;Therapeutic exercise;Balance training;Neuromuscular re-education;Patient/family education;Manual techniques;Wheelchair mobility training;Energy conservation;Passive range of motion;Traction;Orthotic Fit/Training    PT Next Visit Plan  review HEP, airex pad, dynamic stability    PT Home Exercise Plan  see above    Consulted and Agree with Plan of Care  Patient;Family member/caregiver    Family Member Consulted  wife       Patient will benefit from skilled therapeutic intervention in order to improve the following deficits and impairments:  Abnormal gait, Decreased balance, Decreased coordination, Decreased mobility, Impaired tone,  Postural dysfunction, Decreased strength, Decreased safety awareness, Improper body mechanics, Impaired flexibility, Decreased activity tolerance, Decreased endurance, Decreased knowledge of precautions, Difficulty walking, Pain  Visit Diagnosis:  1. Other abnormalities of gait and mobility   2. Unsteadiness on feet   3. Muscle weakness (generalized)        Problem List Patient Active Problem List   Diagnosis Date Noted  . Rib pain on right side 09/25/2018  . Skin sore 12/01/2017  . Hematoma 10/18/2017  . Accidental fall 10/18/2017  . 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 Radicular pain in right arm 10/09/2016  . GERD (gastroesophageal reflux disease) 07/31/2016  . Trochanteric bursitis 03/03/2016  . Left hand pain 10/30/2015  . Fracture, finger, multiple sites 10/30/2015  . Colon cancer screening 07/23/2015  . Healthcare maintenance 07/23/2015  . Gout 02/18/2015  . Depression 01/06/2015  . Irritation of eyelid 08/20/2014  . Dupuytren's contracture 08/20/2014  . Cough 07/21/2014  . Lumbar stenosis with neurogenic claudication 06/13/2014  . Preop cardiovascular exam 05/28/2014  . S/P deep brain stimulator placement 05/08/2014  . Joint pain 01/22/2014  . Medicare annual wellness visit, initial 01/19/2014  . Advance care planning 01/19/2014  . Parkinson's disease ([place]) 12/13/2013  . PTSD (post-traumatic stress disorder) 06/13/2013  . Erectile dysfunction 06/13/2013  . HLD (hyperlipidemia) 06/13/2013  . Hip pain 06/10/2013  . Pain in joint, shoulder region 06/10/2013  . Right leg swelling 06/03/2013  . Essential hypertension 06/03/2013  . Bradycardia by electrocardiogram 06/03/2013  . Obstructive sleep apnea 03/12/2013  . Spinal stenosis, lumbar region, with neurogenic claudication 12/14/2012   Janna Arch, PT, DPT   10/29/2018, 9:39 AM  [place] MAIN [facility] SERVICES [address] [place], [place], [address] Phone: [phone]    Fax:  [phone]  Name: [name] MRN: [id number] Date of Birth: 1950-06-27

## 2018-10-31 ENCOUNTER — Ambulatory Visit: Payer: Medicare PPO

## 2018-10-31 ENCOUNTER — Other Ambulatory Visit: Payer: Self-pay

## 2018-10-31 DIAGNOSIS — M6281 Muscle weakness (generalized): Secondary | ICD-10-CM

## 2018-10-31 DIAGNOSIS — R278 Other lack of coordination: Secondary | ICD-10-CM | POA: Diagnosis not present

## 2018-10-31 DIAGNOSIS — R2689 Other abnormalities of gait and mobility: Secondary | ICD-10-CM | POA: Diagnosis not present

## 2018-10-31 DIAGNOSIS — R2681 Unsteadiness on feet: Secondary | ICD-10-CM

## 2018-10-31 NOTE — Therapy (Signed)
[place] MAIN [facility] SERVICES [address] [place], [place], [address] Phone: [phone]   Fax:  [phone]  Physical Therapy Treatment  Patient Details  Name: [name] MRN: [id number] Date of Birth: 05-15-1950 Referring Provider (PT): Gillermo Murdoch, NP   Encounter Date: 10/31/2018  PT End of Session - 10/31/18 0904    Visit Number  3    Number of Visits  16    Date for PT Re-Evaluation  12/18/18    Authorization Type  3/10 eval start 10/23/18    PT Start Time  0856    PT Stop Time  0940    PT Time Calculation (min)  44 min    Equipment Utilized During Treatment  Gait belt;Back brace    Activity Tolerance  Patient tolerated treatment well    Behavior During Therapy  [facility] for tasks assessed/performed       Past Medical History:  Diagnosis Date  . Arthritis   . Bradycardia   . Cancer [facility]) 2013   skin cancer  . Depression    ptsd  . Dysrhythmia    chronic slow heart rate  . GERD (gastroesophageal reflux disease)   . Headache(784.0)    tension headaches non recent  . History of chicken pox   . History of kidney stones    passed  . Hypertension    treated with HCTZ  . Parkinson's disease ([place])    dx'ed 15 years ago  . PTSD (post-traumatic stress disorder)   . Shortness of breath dyspnea   . Sleep apnea    doesn't use C-pap  . Varicose veins     Past Surgical History:  Procedure Laterality Date  . CHOLECYSTECTOMY N/A 10/22/2014   Procedure: LAPAROSCOPIC CHOLECYSTECTOMY WITH INTRAOPERATIVE CHOLANGIOGRAM;  Surgeon: Dia Crawford III, MD;  Location: [facility] ORS;  Service: General;  Laterality: N/A;  . cyst removed      from lip as a child  . LUMBAR LAMINECTOMY/DECOMPRESSION MICRODISCECTOMY Bilateral 12/14/2012   Procedure: Bilateral lumbar three-four, four-five decompressive laminotomy/foraminotomy;  Surgeon: Charlie Pitter, MD;  Location: [place] NEURO ORS;  Service: Neurosurgery;  Laterality: Bilateral;  . PULSE GENERATOR IMPLANT  Bilateral 12/13/2013   Procedure: Bilateral implantable pulse generator placement;  Surgeon: Erline Levine, MD;  Location: [place] NEURO ORS;  Service: Neurosurgery;  Laterality: Bilateral;  Bilateral implantable pulse generator placement  . skin cancer removed     from ears,   12 lft arm  rt leg 15  . SUBTHALAMIC STIMULATOR BATTERY REPLACEMENT Bilateral 07/14/2017   Procedure: BILATERAL IMPLANTED PULSE GENERATOR CHANGE FOR DEEP BRAIN STIMULATOR;  Surgeon: Erline Levine, MD;  Location: [place];  Service: Neurosurgery;  Laterality: Bilateral;  . SUBTHALAMIC STIMULATOR INSERTION Bilateral 12/06/2013   Procedure: SUBTHALAMIC STIMULATOR INSERTION;  Surgeon: Erline Levine, MD;  Location: [place] NEURO ORS;  Service: Neurosurgery;  Laterality: Bilateral;  Bilateral deep brain stimulator placement    There were no vitals filed for this visit.  Subjective Assessment - 10/31/18 0903    Subjective  Patient reports no falls or LOB since last session. Has been doing his HEP everynight.    Pertinent History  Patient is a pleasant 68 year old male who presents for for Parkinson's. Patient is awaiting disability hearing. He has an aide 14 hours/week and uses a walker/u step at baseline. Patient has been seen for physical therapy at this clinic last year and was discharged to home health due to increasing fall risk and limited ability to negotiate home environment.  At time of discharge this physical therapist was advised to not encourage patient to ambulate and to use wheelchair by referring physician. Patient has since switched physicians to a neurosurgeon at [facility] who altered his deep brain stimulator with positive effects in January and re-adjusted it again about a month ago. Is now allowed to walk and encouraged to do so as frequently as possible. Patient continues to have balance deficits having approximately 5-6 falls a month. PMH includes: Parkinson's, HTN, GERD, HLD, spinal stenosis, PTSD, Deep brain stimulator placement,  cancer, and bradycardia.    Limitations  Walking;Standing;Sitting;House hold activities;Lifting    How long can you sit comfortably?  hours    How long can you stand comfortably?  3 minutes but varies    How long can you walk comfortably?  1-2 minutes    Patient Stated Goals  to stand and walk for longer distances for exercises.    Currently in Pain?  No/denies          -Nustep L4, 3 minutes for warm-up (unbilled);    Treatment:  TherEx:  -Standing marching 5# ankle weight x 15 bilateral, BUE support;   -Standing abduction 5# ankle weight x 15 bilateral, BUE support;   -Standing hamstring curl 5# ankle weight x 15 bilateral, BUE support;   -Standing hip extension 5# ankle weight x 15 bilateral, BUE support; tactile cueing for upright posture   -Standing hip flexion with straight leg 5# ankle weight x 15 bilateral, BUE support   -seated LAQ 10x each leg 3 second holds    -seated adduction squeezes 20x   -seated abduction GTB one leg at a time 10x each leg    -seated marches 20x GTB around knees ; cues for upright posture    -seated heel toe raises 20x    Neuromuscular Re-education   6" step step-ups alternating LE x 10 each, verbal cues for L and R for pace. SUE support, decreased to 10x each LE with two finger support    -Airex pad balance with feet apart eyes closed 3 x 30s,    -speed ladder: one foot in each box to improve smoothness/coordination of ambulation and step length. SUE support 6x length of bars   -speed ladder, BUE support side step two feet one box. ; improved coordination going to left: 8x with decreasing UE support to SUE      Pt. response to medical necessity: Patient will continue to benefit from skilled physical therapy to improve static and dynamic standing balance and decrease fall risk for improved functional capacity of activities                       PT Education - 10/31/18 0904    Education provided  Yes    Education  Details  exercise technique, body mechanics, stability    Person(s) Educated  Patient    Methods  Explanation;Demonstration;Tactile cues;Verbal cues    Comprehension  Verbalized understanding;Returned demonstration;Verbal cues required;Tactile cues required       PT Short Term Goals - 10/23/18 1243      PT SHORT TERM GOAL #1   Title  Pt will perform HEP with family's supervision, for improved balance, transfers, and gait.      Baseline  7/7 HEP given    Time  2    Period  Weeks    Status  New    Target Date  11/06/18        PT Long Term Goals -  10/23/18 1509      PT LONG TERM GOAL #1   Title  Patient (> 44 years old) will complete five times sit to stand test in < 15 seconds indicating an increased LE strength and improved balance.    Baseline  7/7: 20 seconds with UE support and one posterior LOB    Time  8    Period  Weeks    Status  New    Target Date  12/18/18      PT LONG TERM GOAL #2   Title  Patient will increase 10 meter walk test to >1.17m/s as to improve gait speed for better community ambulation and to reduce fall risk.    Baseline  7/7: .58 m/s with U step walker    Time  8    Period  Weeks    Status  New    Target Date  12/18/18      PT LONG TERM GOAL #3   Title  Patient will increase Berg Balance score by > 6 points (31/56) to demonstrate decreased fall risk during functional activities.    Baseline  7/7: 25/56    Time  8    Period  Weeks    Status  New    Target Date  12/18/18      PT LONG TERM GOAL #4   Title  Patient will deny any falls over past 4 weeks to demonstrate improved safety awareness at home and in the community.    Baseline  7/7: averages 6 falls/month    Time  8    Period  Weeks    Status  New    Target Date  12/18/18            Plan - 10/31/18 0943    Clinical Impression Statement  Patient presents with good motivation. Demonstrates narrow base of support with ambulation requiring visual and verbal cueing for widening BOS.  Increased bilateral knee flexion with fatigue noted. Widened step length was benefited by use of speed ladder for visual assistance. Patient will benefit from skilled physical therapy to increase stability, strength, mobility, and posture to decrease falls risk and improve quality of life.    Personal Factors and Comorbidities  Age;Comorbidity 3+;Past/Current Experience;Time since onset of injury/illness/exacerbation    Comorbidities  athritis, bradycardia, cancer, depression, dysrhythmia, GERD, HTN, Parkinson's, PTSD, SOB, sleep apnea    Examination-Activity Limitations  Bathing;Bed Mobility;Caring for Others;Bend;Dressing;Lift;Locomotion Level;Reach Overhead;Sit;Squat;Stairs;Stand;Toileting;Transfers    Examination-Participation Restrictions  Church;Cleaning;Community Activity;Interpersonal Relationship;Shop;Volunteer;Yard Work;Other    Stability/Clinical Decision Making  Unstable/Unpredictable    Rehab Potential  Fair    PT Frequency  2x / week    PT Duration  8 weeks    PT Treatment/Interventions  ADLs/Self Care Home Management;Aquatic Therapy;Cryotherapy;Electrical Stimulation;Iontophoresis 4mg /ml Dexamethasone;Moist Heat;Ultrasound;Contrast Bath;DME Instruction;Gait training;Stair training;Functional mobility training;Therapeutic activities;Therapeutic exercise;Balance training;Neuromuscular re-education;Patient/family education;Manual techniques;Wheelchair mobility training;Energy conservation;Passive range of motion;Traction;Orthotic Fit/Training    PT Next Visit Plan  review HEP, airex pad, dynamic stability    PT Home Exercise Plan  see above    Consulted and Agree with Plan of Care  Patient;Family member/caregiver    Family Member Consulted  wife       Patient will benefit from skilled therapeutic intervention in order to improve the following deficits and impairments:  Abnormal gait, Decreased balance, Decreased coordination, Decreased mobility, Impaired tone, Postural dysfunction,  Decreased strength, Decreased safety awareness, Improper body mechanics, Impaired flexibility, Decreased activity tolerance, Decreased endurance, Decreased knowledge of precautions, Difficulty walking, Pain  Visit Diagnosis:  1. Other abnormalities of gait and mobility   2. Unsteadiness on feet   3. Muscle weakness (generalized)        Problem List Patient Active Problem List   Diagnosis Date Noted  . Rib pain on right side 09/25/2018  . Skin sore 12/01/2017  . Hematoma 10/18/2017  . Accidental fall 10/18/2017  . 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 Radicular pain in right arm 10/09/2016  . GERD (gastroesophageal reflux disease) 07/31/2016  . Trochanteric bursitis 03/03/2016  . Left hand pain 10/30/2015  . Fracture, finger, multiple sites 10/30/2015  . Colon cancer screening 07/23/2015  . Healthcare maintenance 07/23/2015  . Gout 02/18/2015  . Depression 01/06/2015  . Irritation of eyelid 08/20/2014  . Dupuytren's contracture 08/20/2014  . Cough 07/21/2014  . Lumbar stenosis with neurogenic claudication 06/13/2014  . Preop cardiovascular exam 05/28/2014  . S/P deep brain stimulator placement 05/08/2014  . Joint pain 01/22/2014  . Medicare annual wellness visit, initial 01/19/2014  . Advance care planning 01/19/2014  . Parkinson's disease ([place]) 12/13/2013  . PTSD (post-traumatic stress disorder) 06/13/2013  . Erectile dysfunction 06/13/2013  . HLD (hyperlipidemia) 06/13/2013  . Hip pain 06/10/2013  . Pain in joint, shoulder region 06/10/2013  . Right leg swelling 06/03/2013  . Essential hypertension 06/03/2013  . Bradycardia by electrocardiogram 06/03/2013  . Obstructive sleep apnea 03/12/2013  . Spinal stenosis, lumbar region, with neurogenic claudication 12/14/2012   Janna Arch, PT, DPT   10/31/2018, 9:45 AM  [place] MAIN [facility] SERVICES [address] [place], [place], [address] Phone: [phone]   Fax:   [phone]  Name: [name] MRN: [id number] Date of Birth: 06-30-1950

## 2018-11-05 ENCOUNTER — Other Ambulatory Visit: Payer: Self-pay

## 2018-11-05 ENCOUNTER — Ambulatory Visit: Payer: Medicare PPO

## 2018-11-05 DIAGNOSIS — R2689 Other abnormalities of gait and mobility: Secondary | ICD-10-CM | POA: Diagnosis not present

## 2018-11-05 DIAGNOSIS — R2681 Unsteadiness on feet: Secondary | ICD-10-CM

## 2018-11-05 DIAGNOSIS — M6281 Muscle weakness (generalized): Secondary | ICD-10-CM

## 2018-11-05 DIAGNOSIS — R278 Other lack of coordination: Secondary | ICD-10-CM | POA: Diagnosis not present

## 2018-11-05 NOTE — Therapy (Signed)
[place] MAIN [facility] SERVICES [address] [name], [place], [address] Phone: [phone]   Fax:  [phone]  Physical Therapy Treatment  Patient Details  Name: [name] MRN: [id number] Date of Birth: October 28, 1950 Referring Provider (PT): Gillermo Murdoch, NP   Encounter Date: 11/05/2018  PT End of Session - 11/05/18 0906    Visit Number  4    Number of Visits  16    Date for PT Re-Evaluation  12/18/18    Authorization Type  4/10 eval start 10/23/18    PT Start Time  0900    PT Stop Time  0944    PT Time Calculation (min)  44 min    Equipment Utilized During Treatment  Gait belt;Back brace    Activity Tolerance  Patient tolerated treatment well    Behavior During Therapy  [facility] for tasks assessed/performed       Past Medical History:  Diagnosis Date  . Arthritis   . Bradycardia   . Cancer [facility]) 2013   skin cancer  . Depression    ptsd  . Dysrhythmia    chronic slow heart rate  . GERD (gastroesophageal reflux disease)   . Headache(784.0)    tension headaches non recent  . History of chicken pox   . History of kidney stones    passed  . Hypertension    treated with HCTZ  . Parkinson's disease ([place])    dx'ed 15 years ago  . PTSD (post-traumatic stress disorder)   . Shortness of breath dyspnea   . Sleep apnea    doesn't use C-pap  . Varicose veins     Past Surgical History:  Procedure Laterality Date  . CHOLECYSTECTOMY N/A 10/22/2014   Procedure: LAPAROSCOPIC CHOLECYSTECTOMY WITH INTRAOPERATIVE CHOLANGIOGRAM;  Surgeon: Dia Crawford III, MD;  Location: [facility] ORS;  Service: General;  Laterality: N/A;  . cyst removed      from lip as a child  . LUMBAR LAMINECTOMY/DECOMPRESSION MICRODISCECTOMY Bilateral 12/14/2012   Procedure: Bilateral lumbar three-four, four-five decompressive laminotomy/foraminotomy;  Surgeon: Charlie Pitter, MD;  Location: [place] NEURO ORS;  Service: Neurosurgery;  Laterality: Bilateral;  . PULSE GENERATOR IMPLANT  Bilateral 12/13/2013   Procedure: Bilateral implantable pulse generator placement;  Surgeon: Erline Levine, MD;  Location: [place] NEURO ORS;  Service: Neurosurgery;  Laterality: Bilateral;  Bilateral implantable pulse generator placement  . skin cancer removed     from ears,   12 lft arm  rt leg 15  . SUBTHALAMIC STIMULATOR BATTERY REPLACEMENT Bilateral 07/14/2017   Procedure: BILATERAL IMPLANTED PULSE GENERATOR CHANGE FOR DEEP BRAIN STIMULATOR;  Surgeon: Erline Levine, MD;  Location: [place];  Service: Neurosurgery;  Laterality: Bilateral;  . SUBTHALAMIC STIMULATOR INSERTION Bilateral 12/06/2013   Procedure: SUBTHALAMIC STIMULATOR INSERTION;  Surgeon: Erline Levine, MD;  Location: [place] NEURO ORS;  Service: Neurosurgery;  Laterality: Bilateral;  Bilateral deep brain stimulator placement    There were no vitals filed for this visit.  Subjective Assessment - 11/05/18 0904    Subjective  Patient reports he went to [place] to visit son in Sports [employer]. Reports he had no falls on trip and is accompanied by caregiver today.    Pertinent History  Patient is a pleasant 68 year old male who presents for for Parkinson's. Patient is awaiting disability hearing. He has an aide 14 hours/week and uses a walker/u step at baseline. Patient has been seen for physical therapy at this clinic last year and was discharged to home health due to  increasing fall risk and limited ability to negotiate home environment.  At time of discharge this physical therapist was advised to not encourage patient to ambulate and to use wheelchair by referring physician. Patient has since switched physicians to a neurosurgeon at [facility] who altered his deep brain stimulator with positive effects in January and re-adjusted it again about a month ago. Is now allowed to walk and encouraged to do so as frequently as possible. Patient continues to have balance deficits having approximately 5-6 falls a month. PMH includes: Parkinson's, HTN, GERD, HLD,  spinal stenosis, PTSD, Deep brain stimulator placement, cancer, and bradycardia.    Limitations  Walking;Standing;Sitting;House hold activities;Lifting    How long can you sit comfortably?  hours    How long can you stand comfortably?  3 minutes but varies    How long can you walk comfortably?  1-2 minutes    Patient Stated Goals  to stand and walk for longer distances for exercises.    Currently in Pain?  No/denies          -Nustep L4, 3 minutes for warm-up (unbilled);    Treatment:  TherEx:  -Standing marching 5# ankle weight x 15 bilateral, BUE support;   -Standing abduction 5# ankle weight x 15 bilateral, BUE support;   -Standing hamstring curl 5# ankle weight x 15 bilateral, BUE support;   -Standing hip extension 5# ankle weight x 15 bilateral, BUE support; tactile cueing for upright posture   -Standing hip flexion marches 5# ankle weight x 15 bilateral, BUE support   -seated LAQ 5# ankle weight 12x each leg 3 second holds    -seated adduction squeezes 20x 3 second holds small ball    -GTB hamstring curl 15x each LE  -modified windmill cross body seated exercise 8x each side.   -seated adduction ball squeeze between feet with LAQ x20  Neuromuscular Re-education   Large step clap then step back, 10x each LE, No UE support, occasional LOB  Step over orange hurdle 15x each LE; SUE support, challenging with LLE due to single limb challenge of RLE        Pt. response to medical necessity: Patient will continue to benefit from skilled physical therapy to improve static and dynamic standing balance and decrease fall risk for improved functional capacity of activities                       PT Education - 11/05/18 0905    Education provided  Yes    Education Details  exercise technique, body mechanics, stability    Person(s) Educated  Patient    Methods  Explanation;Demonstration;Tactile cues;Verbal cues    Comprehension  Verbalized  understanding;Returned demonstration;Verbal cues required;Tactile cues required       PT Short Term Goals - 10/23/18 1243      PT SHORT TERM GOAL #1   Title  Pt will perform HEP with family's supervision, for improved balance, transfers, and gait.      Baseline  7/7 HEP given    Time  2    Period  Weeks    Status  New    Target Date  11/06/18        PT Long Term Goals - 10/23/18 1509      PT LONG TERM GOAL #1   Title  Patient (> 39 years old) will complete five times sit to stand test in < 15 seconds indicating an increased LE strength and improved balance.  Baseline  7/7: 20 seconds with UE support and one posterior LOB    Time  8    Period  Weeks    Status  New    Target Date  12/18/18      PT LONG TERM GOAL #2   Title  Patient will increase 10 meter walk test to >1.73m/s as to improve gait speed for better community ambulation and to reduce fall risk.    Baseline  7/7: .58 m/s with U step walker    Time  8    Period  Weeks    Status  New    Target Date  12/18/18      PT LONG TERM GOAL #3   Title  Patient will increase Berg Balance score by > 6 points (31/56) to demonstrate decreased fall risk during functional activities.    Baseline  7/7: 25/56    Time  8    Period  Weeks    Status  New    Target Date  12/18/18      PT LONG TERM GOAL #4   Title  Patient will deny any falls over past 4 weeks to demonstrate improved safety awareness at home and in the community.    Baseline  7/7: averages 6 falls/month    Time  8    Period  Weeks    Status  New    Target Date  12/18/18            Plan - 11/05/18 8119    Clinical Impression Statement  Patient fatigued today from weekend trip requiring more frequent rest breaks and cueing for sequencing of task. Patient is challenged by maintaining upright posture and extended limbs frequently returning to forward trunk position and knee flexion. Patient will benefit from skilled physical therapy to increase stability,  strength, mobility, and posture to decrease falls risk and improve quality of life.    Personal Factors and Comorbidities  Age;Comorbidity 3+;Past/Current Experience;Time since onset of injury/illness/exacerbation    Comorbidities  athritis, bradycardia, cancer, depression, dysrhythmia, GERD, HTN, Parkinson's, PTSD, SOB, sleep apnea    Examination-Activity Limitations  Bathing;Bed Mobility;Caring for Others;Bend;Dressing;Lift;Locomotion Level;Reach Overhead;Sit;Squat;Stairs;Stand;Toileting;Transfers    Examination-Participation Restrictions  Church;Cleaning;Community Activity;Interpersonal Relationship;Shop;Volunteer;Yard Work;Other    Stability/Clinical Decision Making  Unstable/Unpredictable    Rehab Potential  Fair    PT Frequency  2x / week    PT Duration  8 weeks    PT Treatment/Interventions  ADLs/Self Care Home Management;Aquatic Therapy;Cryotherapy;Electrical Stimulation;Iontophoresis 4mg /ml Dexamethasone;Moist Heat;Ultrasound;Contrast Bath;DME Instruction;Gait training;Stair training;Functional mobility training;Therapeutic activities;Therapeutic exercise;Balance training;Neuromuscular re-education;Patient/family education;Manual techniques;Wheelchair mobility training;Energy conservation;Passive range of motion;Traction;Orthotic Fit/Training    PT Next Visit Plan  review HEP, airex pad, dynamic stability    PT Home Exercise Plan  see above    Consulted and Agree with Plan of Care  Patient;Family member/caregiver    Family Member Consulted  wife       Patient will benefit from skilled therapeutic intervention in order to improve the following deficits and impairments:  Abnormal gait, Decreased balance, Decreased coordination, Decreased mobility, Impaired tone, Postural dysfunction, Decreased strength, Decreased safety awareness, Improper body mechanics, Impaired flexibility, Decreased activity tolerance, Decreased endurance, Decreased knowledge of precautions, Difficulty walking,  Pain  Visit Diagnosis: 1. Other abnormalities of gait and mobility   2. Unsteadiness on feet   3. Muscle weakness (generalized)        Problem List Patient Active Problem List   Diagnosis Date Noted  . Rib pain on right side 09/25/2018  . Skin  sore 12/01/2017  . Hematoma 10/18/2017  . Accidental fall 10/18/2017  . 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 Radicular pain in right arm 10/09/2016  . GERD (gastroesophageal reflux disease) 07/31/2016  . Trochanteric bursitis 03/03/2016  . Left hand pain 10/30/2015  . Fracture, finger, multiple sites 10/30/2015  . Colon cancer screening 07/23/2015  . Healthcare maintenance 07/23/2015  . Gout 02/18/2015  . Depression 01/06/2015  . Irritation of eyelid 08/20/2014  . Dupuytren's contracture 08/20/2014  . Cough 07/21/2014  . Lumbar stenosis with neurogenic claudication 06/13/2014  . Preop cardiovascular exam 05/28/2014  . S/P deep brain stimulator placement 05/08/2014  . Joint pain 01/22/2014  . Medicare annual wellness visit, initial 01/19/2014  . Advance care planning 01/19/2014  . Parkinson's disease ([place]) 12/13/2013  . PTSD (post-traumatic stress disorder) 06/13/2013  . Erectile dysfunction 06/13/2013  . HLD (hyperlipidemia) 06/13/2013  . Hip pain 06/10/2013  . Pain in joint, shoulder region 06/10/2013  . Right leg swelling 06/03/2013  . Essential hypertension 06/03/2013  . Bradycardia by electrocardiogram 06/03/2013  . Obstructive sleep apnea 03/12/2013  . Spinal stenosis, lumbar region, with neurogenic claudication 12/14/2012   Janna Arch, PT, DPT   11/05/2018, 9:46 AM  [place] MAIN [facility] SERVICES [address] [place], [place], [address] Phone: [phone]   Fax:  [phone]  Name: [name] MRN: [id number] Date of Birth: 1950-10-12

## 2018-11-07 ENCOUNTER — Ambulatory Visit: Payer: Medicare PPO

## 2018-11-07 ENCOUNTER — Other Ambulatory Visit: Payer: Self-pay

## 2018-11-07 DIAGNOSIS — R2681 Unsteadiness on feet: Secondary | ICD-10-CM

## 2018-11-07 DIAGNOSIS — R2689 Other abnormalities of gait and mobility: Secondary | ICD-10-CM | POA: Diagnosis not present

## 2018-11-07 DIAGNOSIS — R278 Other lack of coordination: Secondary | ICD-10-CM

## 2018-11-07 DIAGNOSIS — M6281 Muscle weakness (generalized): Secondary | ICD-10-CM | POA: Diagnosis not present

## 2018-11-07 NOTE — Therapy (Signed)
[place] MAIN [facility] SERVICES [address] [place], [place], [address] Phone: [phone]   Fax:  [phone]  Physical Therapy Treatment  Patient Details  Name: [name] MRN: [id number] Date of Birth: [birth year] Referring Provider (PT): Gillermo Murdoch, NP   Encounter Date: 11/07/2018  PT End of Session - 11/07/18 0901    Visit Number  5    Number of Visits  16    Date for PT Re-Evaluation  12/18/18    Authorization Type  5/10 eval start 10/23/18    PT Start Time  0856    PT Stop Time  0940    PT Time Calculation (min)  44 min    Equipment Utilized During Treatment  Gait belt;Back brace    Activity Tolerance  Patient tolerated treatment well    Behavior During Therapy  [facility] for tasks assessed/performed       Past Medical History:  Diagnosis Date  . Arthritis   . Bradycardia   . Cancer [facility]) 2013   skin cancer  . Depression    ptsd  . Dysrhythmia    chronic slow heart rate  . GERD (gastroesophageal reflux disease)   . Headache(784.0)    tension headaches non recent  . History of chicken pox   . History of kidney stones    passed  . Hypertension    treated with HCTZ  . Parkinson's disease ([place])    dx'ed 15 years ago  . PTSD (post-traumatic stress disorder)   . Shortness of breath dyspnea   . Sleep apnea    doesn't use C-pap  . Varicose veins     Past Surgical History:  Procedure Laterality Date  . CHOLECYSTECTOMY N/A 10/22/2014   Procedure: LAPAROSCOPIC CHOLECYSTECTOMY WITH INTRAOPERATIVE CHOLANGIOGRAM;  Surgeon: Dia Crawford III, MD;  Location: [facility] ORS;  Service: General;  Laterality: N/A;  . cyst removed      from lip as a child  . LUMBAR LAMINECTOMY/DECOMPRESSION MICRODISCECTOMY Bilateral 12/14/2012   Procedure: Bilateral lumbar three-four, four-five decompressive laminotomy/foraminotomy;  Surgeon: Charlie Pitter, MD;  Location: [place] NEURO ORS;  Service: Neurosurgery;  Laterality: Bilateral;  . PULSE GENERATOR IMPLANT  Bilateral 12/13/2013   Procedure: Bilateral implantable pulse generator placement;  Surgeon: Erline Levine, MD;  Location: [place] NEURO ORS;  Service: Neurosurgery;  Laterality: Bilateral;  Bilateral implantable pulse generator placement  . skin cancer removed     from ears,   12 lft arm  rt leg 15  . SUBTHALAMIC STIMULATOR BATTERY REPLACEMENT Bilateral 07/14/2017   Procedure: BILATERAL IMPLANTED PULSE GENERATOR CHANGE FOR DEEP BRAIN STIMULATOR;  Surgeon: Erline Levine, MD;  Location: [place];  Service: Neurosurgery;  Laterality: Bilateral;  . SUBTHALAMIC STIMULATOR INSERTION Bilateral 12/06/2013   Procedure: SUBTHALAMIC STIMULATOR INSERTION;  Surgeon: Erline Levine, MD;  Location: [place] NEURO ORS;  Service: Neurosurgery;  Laterality: Bilateral;  Bilateral deep brain stimulator placement    There were no vitals filed for this visit.  Subjective Assessment - 11/07/18 0859    Subjective  Patient reports no falls since last session but had a stumble yesterday where he caught himself when standing getting out of chair. Yesterday was a "bad day" for balance per patient.  Patient compliant with HEP.    Pertinent History  Patient is a pleasant 68 year old male who presents for for Parkinson's. Patient is awaiting disability hearing. He has an aide 14 hours/week and uses a walker/u step at baseline. Patient has been seen for physical therapy at this  clinic last year and was discharged to home health due to increasing fall risk and limited ability to negotiate home environment.  At time of discharge this physical therapist was advised to not encourage patient to ambulate and to use wheelchair by referring physician. Patient has since switched physicians to a neurosurgeon at [facility] who altered his deep brain stimulator with positive effects in January and re-adjusted it again about a month ago. Is now allowed to walk and encouraged to do so as frequently as possible. Patient continues to have balance deficits having  approximately 5-6 falls a month. PMH includes: Parkinson's, HTN, GERD, HLD, spinal stenosis, PTSD, Deep brain stimulator placement, cancer, and bradycardia.    Limitations  Walking;Standing;Sitting;House hold activities;Lifting    How long can you sit comfortably?  hours    How long can you stand comfortably?  3 minutes but varies    How long can you walk comfortably?  1-2 minutes    Patient Stated Goals  to stand and walk for longer distances for exercises.    Currently in Pain?  No/denies         -Nustep L4, 3 minutes RPM> 60 for cardiovascular challenge/rhythmic reciprocating pattern of muscle recruitment.     Treatment:  TherEx:  -Quantum leg press # 115 12x ; 2 sets; second set #125, cueing for sequencing and slowing velocity for maximal muscle recruitment  -Quantum leg press, single limb #75 ; 2 sets, second  #85 12x each limb; cueing for sequencing and slow velocity for maximal muscle recruitment   -seated adduction squeezes 20x 3 second holds small ball    -sit to stand squat with 3.5 lb bar overhead raises, excessive trunk lean.    -seated GTB abduction 10x 5 second holds   -standing high knee marches with focus on knee extension of weightbearing limb 2x length of bars with tactile cueing required for RLE.    Neuromuscular Re-education   Large step clap then step back, 10x each LE, No UE support, occasional LOB    ambulate with U walker in hall with CGA and horizontal head turns for reading playing cards on walls x 2 lengths of hall. R knee buckling>L knee buckling upon weight acceptance noted.      Pt. response to medical necessity: Patient will continue to benefit from skilled physical therapy to improve static and dynamic standing balance and decrease fall risk for improved functional capacity of activities                       PT Education - 11/07/18 0900    Education provided  Yes    Education Details  exercise technique, body mechanics,  stability    Person(s) Educated  Patient    Methods  Explanation;Demonstration;Tactile cues;Verbal cues    Comprehension  Verbalized understanding;Returned demonstration;Verbal cues required;Tactile cues required;Need further instruction       PT Short Term Goals - 10/23/18 1243      PT SHORT TERM GOAL #1   Title  Pt will perform HEP with family's supervision, for improved balance, transfers, and gait.      Baseline  7/7 HEP given    Time  2    Period  Weeks    Status  New    Target Date  11/06/18        PT Long Term Goals - 10/23/18 1509      PT LONG TERM GOAL #1   Title  Patient (> 67 years old) will  complete five times sit to stand test in < 15 seconds indicating an increased LE strength and improved balance.    Baseline  7/7: 20 seconds with UE support and one posterior LOB    Time  8    Period  Weeks    Status  New    Target Date  12/18/18      PT LONG TERM GOAL #2   Title  Patient will increase 10 meter walk test to >1.94m/s as to improve gait speed for better community ambulation and to reduce fall risk.    Baseline  7/7: .58 m/s with U step walker    Time  8    Period  Weeks    Status  New    Target Date  12/18/18      PT LONG TERM GOAL #3   Title  Patient will increase Berg Balance score by > 6 points (31/56) to demonstrate decreased fall risk during functional activities.    Baseline  7/7: 25/56    Time  8    Period  Weeks    Status  New    Target Date  12/18/18      PT LONG TERM GOAL #4   Title  Patient will deny any falls over past 4 weeks to demonstrate improved safety awareness at home and in the community.    Baseline  7/7: averages 6 falls/month    Time  8    Period  Weeks    Status  New    Target Date  12/18/18            Plan - 11/07/18 1610    Clinical Impression Statement  Patient is challenged maintaining knee extension upon weightbearing stage of ambulation with RLE impacted more than LLE today. Patient educated on need for stability  during step length and had a focus on strengthening limbs for improved ability to support weight. Patient will benefit from skilled physical therapy to increase stability, strength, mobility, and posture to decrease falls risk and improve quality of life.    Personal Factors and Comorbidities  Age;Comorbidity 3+;Past/Current Experience;Time since onset of injury/illness/exacerbation    Comorbidities  athritis, bradycardia, cancer, depression, dysrhythmia, GERD, HTN, Parkinson's, PTSD, SOB, sleep apnea    Examination-Activity Limitations  Bathing;Bed Mobility;Caring for Others;Bend;Dressing;Lift;Locomotion Level;Reach Overhead;Sit;Squat;Stairs;Stand;Toileting;Transfers    Examination-Participation Restrictions  Church;Cleaning;Community Activity;Interpersonal Relationship;Shop;Volunteer;Yard Work;Other    Stability/Clinical Decision Making  Unstable/Unpredictable    Rehab Potential  Fair    PT Frequency  2x / week    PT Duration  8 weeks    PT Treatment/Interventions  ADLs/Self Care Home Management;Aquatic Therapy;Cryotherapy;Electrical Stimulation;Iontophoresis 4mg /ml Dexamethasone;Moist Heat;Ultrasound;Contrast Bath;DME Instruction;Gait training;Stair training;Functional mobility training;Therapeutic activities;Therapeutic exercise;Balance training;Neuromuscular re-education;Patient/family education;Manual techniques;Wheelchair mobility training;Energy conservation;Passive range of motion;Traction;Orthotic Fit/Training    PT Next Visit Plan  review HEP, airex pad, dynamic stability    PT Home Exercise Plan  see above    Consulted and Agree with Plan of Care  Patient;Family member/caregiver    Family Member Consulted  wife       Patient will benefit from skilled therapeutic intervention in order to improve the following deficits and impairments:  Abnormal gait, Decreased balance, Decreased coordination, Decreased mobility, Impaired tone, Postural dysfunction, Decreased strength, Decreased safety  awareness, Improper body mechanics, Impaired flexibility, Decreased activity tolerance, Decreased endurance, Decreased knowledge of precautions, Difficulty walking, Pain  Visit Diagnosis: 1. Other abnormalities of gait and mobility   2. Unsteadiness on feet   3. Muscle weakness (generalized)   4.  Other lack of coordination        Problem List Patient Active Problem List   Diagnosis Date Noted  . Rib pain on right side 09/25/2018  . Skin sore 12/01/2017  . Hematoma 10/18/2017  . Accidental fall 10/18/2017  . 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 Radicular pain in right arm 10/09/2016  . GERD (gastroesophageal reflux disease) 07/31/2016  . Trochanteric bursitis 03/03/2016  . Left hand pain 10/30/2015  . Fracture, finger, multiple sites 10/30/2015  . Colon cancer screening 07/23/2015  . Healthcare maintenance 07/23/2015  . Gout 02/18/2015  . Depression 01/06/2015  . Irritation of eyelid 08/20/2014  . Dupuytren's contracture 08/20/2014  . Cough 07/21/2014  . Lumbar stenosis with neurogenic claudication 06/13/2014  . Preop cardiovascular exam 05/28/2014  . S/P deep brain stimulator placement 05/08/2014  . Joint pain 01/22/2014  . Medicare annual wellness visit, initial 01/19/2014  . Advance care planning 01/19/2014  . Parkinson's disease ([place]) 12/13/2013  . PTSD (post-traumatic stress disorder) 06/13/2013  . Erectile dysfunction 06/13/2013  . HLD (hyperlipidemia) 06/13/2013  . Hip pain 06/10/2013  . Pain in joint, shoulder region 06/10/2013  . Right leg swelling 06/03/2013  . Essential hypertension 06/03/2013  . Bradycardia by electrocardiogram 06/03/2013  . Obstructive sleep apnea 03/12/2013  . Spinal stenosis, lumbar region, with neurogenic claudication 12/14/2012   Janna Arch, PT, DPT   11/07/2018, 9:43 AM  [place] MAIN [facility] SERVICES [address] [place], [place], [address] Phone: [phone]   Fax:   [phone]  Name: [name] MRN: [id number] Date of Birth: [birth year]

## 2018-11-12 ENCOUNTER — Ambulatory Visit (INDEPENDENT_AMBULATORY_CARE_PROVIDER_SITE_OTHER): Payer: Medicare PPO | Admitting: Psychology

## 2018-11-12 ENCOUNTER — Ambulatory Visit: Payer: Medicare PPO

## 2018-11-12 DIAGNOSIS — F331 Major depressive disorder, recurrent, moderate: Secondary | ICD-10-CM | POA: Diagnosis not present

## 2018-11-12 DIAGNOSIS — F4323 Adjustment disorder with mixed anxiety and depressed mood: Secondary | ICD-10-CM | POA: Diagnosis not present

## 2018-11-14 ENCOUNTER — Ambulatory Visit: Payer: Medicare PPO

## 2018-11-14 ENCOUNTER — Other Ambulatory Visit: Payer: Self-pay

## 2018-11-14 DIAGNOSIS — R2681 Unsteadiness on feet: Secondary | ICD-10-CM

## 2018-11-14 DIAGNOSIS — R2689 Other abnormalities of gait and mobility: Secondary | ICD-10-CM

## 2018-11-14 DIAGNOSIS — M6281 Muscle weakness (generalized): Secondary | ICD-10-CM | POA: Diagnosis not present

## 2018-11-14 DIAGNOSIS — R278 Other lack of coordination: Secondary | ICD-10-CM | POA: Diagnosis not present

## 2018-11-14 NOTE — Therapy (Signed)
[place] MAIN [facility] SERVICES [address] [place], [place], [address] Phone: [phone]   Fax:  [phone]  Physical Therapy Treatment  Patient Details  Name: [name] MRN: [id number] Date of Birth: 1951/03/07 Referring Provider (PT): Gillermo Murdoch, NP   Encounter Date: 11/14/2018  PT End of Session - 11/14/18 1021    Visit Number  6    Number of Visits  16    Date for PT Re-Evaluation  12/18/18    Authorization Type  6/10 eval start 10/23/18    PT Start Time  1015    PT Stop Time  1100    PT Time Calculation (min)  45 min    Equipment Utilized During Treatment  Gait belt;Back brace    Activity Tolerance  Patient tolerated treatment well    Behavior During Therapy  [facility] for tasks assessed/performed       Past Medical History:  Diagnosis Date  . Arthritis   . Bradycardia   . Cancer [facility]) 2013   skin cancer  . Depression    ptsd  . Dysrhythmia    chronic slow heart rate  . GERD (gastroesophageal reflux disease)   . Headache(784.0)    tension headaches non recent  . History of chicken pox   . History of kidney stones    passed  . Hypertension    treated with HCTZ  . Parkinson's disease ([place])    dx'ed 15 years ago  . PTSD (post-traumatic stress disorder)   . Shortness of breath dyspnea   . Sleep apnea    doesn't use C-pap  . Varicose veins     Past Surgical History:  Procedure Laterality Date  . CHOLECYSTECTOMY N/A 10/22/2014   Procedure: LAPAROSCOPIC CHOLECYSTECTOMY WITH INTRAOPERATIVE CHOLANGIOGRAM;  Surgeon: Dia Crawford III, MD;  Location: [facility] ORS;  Service: General;  Laterality: N/A;  . cyst removed      from lip as a child  . LUMBAR LAMINECTOMY/DECOMPRESSION MICRODISCECTOMY Bilateral 12/14/2012   Procedure: Bilateral lumbar three-four, four-five decompressive laminotomy/foraminotomy;  Surgeon: Charlie Pitter, MD;  Location: [place] NEURO ORS;  Service: Neurosurgery;  Laterality: Bilateral;  . PULSE GENERATOR IMPLANT  Bilateral 12/13/2013   Procedure: Bilateral implantable pulse generator placement;  Surgeon: Erline Levine, MD;  Location: [place] NEURO ORS;  Service: Neurosurgery;  Laterality: Bilateral;  Bilateral implantable pulse generator placement  . skin cancer removed     from ears,   12 lft arm  rt leg 15  . SUBTHALAMIC STIMULATOR BATTERY REPLACEMENT Bilateral 07/14/2017   Procedure: BILATERAL IMPLANTED PULSE GENERATOR CHANGE FOR DEEP BRAIN STIMULATOR;  Surgeon: Erline Levine, MD;  Location: [place];  Service: Neurosurgery;  Laterality: Bilateral;  . SUBTHALAMIC STIMULATOR INSERTION Bilateral 12/06/2013   Procedure: SUBTHALAMIC STIMULATOR INSERTION;  Surgeon: Erline Levine, MD;  Location: [place] NEURO ORS;  Service: Neurosurgery;  Laterality: Bilateral;  Bilateral deep brain stimulator placement    There were no vitals filed for this visit.  Subjective Assessment - 11/14/18 1018    Subjective  Patient reports one fall the night before last.  Sat on the edge of the bed to put on his pajamas but then slid off on accident when putting on pajama leg to floor. Climbed up side of bed to get up from the ground.    Pertinent History  Patient is a pleasant 68 year old male who presents for for Parkinson's. Patient is awaiting disability hearing. He has an aide 14 hours/week and uses a walker/u step at baseline.  Patient has been seen for physical therapy at this clinic last year and was discharged to home health due to increasing fall risk and limited ability to negotiate home environment.  At time of discharge this physical therapist was advised to not encourage patient to ambulate and to use wheelchair by referring physician. Patient has since switched physicians to a neurosurgeon at [facility] who altered his deep brain stimulator with positive effects in January and re-adjusted it again about a month ago. Is now allowed to walk and encouraged to do so as frequently as possible. Patient continues to have balance deficits having  approximately 5-6 falls a month. PMH includes: Parkinson's, HTN, GERD, HLD, spinal stenosis, PTSD, Deep brain stimulator placement, cancer, and bradycardia.    Limitations  Walking;Standing;Sitting;House hold activities;Lifting    How long can you sit comfortably?  hours    How long can you stand comfortably?  3 minutes but varies    How long can you walk comfortably?  1-2 minutes    Patient Stated Goals  to stand and walk for longer distances for exercises.    Currently in Pain?  Yes    Pain Score  3     Pain Location  Back    Pain Orientation  Lower    Pain Descriptors / Indicators  Aching;Throbbing    Pain Onset  Today    Pain Frequency  Intermittent    Aggravating Factors   woke up this morning with it    Pain Relieving Factors  heat           -Nustep L4, 4 minutes RPM> 60 for cardiovascular challenge/rhythmic reciprocating pattern of muscle recruitment.     Treatment:  TherEx:  -Quantum leg press # 150; 12x ; 2 sets, cueing for sequencing and slowing velocity for maximal muscle recruitment   -Quantum leg press, single limb #90 12x each limb; 2 sets useing for sequencing and slow velocity for maximal muscle recruitment   -seated adduction ball  squeezes between feet with LAQ  15x   -sit to stand squat with 3.5 lb bar overhead raises, excessive trunk lean posterior requiring CGA/tactile cueing.   Seated LAQ soccer ball passes with PT for coordination and sequencing of muscle recruitment .Progression to "scoring" 10 points each LE by kicking ball between two cones for accuracy and recruitment.    -seated GTB abduction 20x 5 second holds    -seated windmill( modified) 8x each direction        Pt. response to medical necessity: Patient will continue to benefit from skilled physical therapy to improve static and dynamic standing balance and decrease fall risk for improved functional capacity of activities                      PT Education - 11/14/18 1021     Education provided  Yes    Education Details  exercise technique, body mechanics, stability    Person(s) Educated  Patient    Methods  Explanation;Demonstration;Tactile cues;Verbal cues    Comprehension  Verbalized understanding;Returned demonstration;Verbal cues required;Tactile cues required       PT Short Term Goals - 10/23/18 1243      PT SHORT TERM GOAL #1   Title  Pt will perform HEP with family's supervision, for improved balance, transfers, and gait.      Baseline  7/7 HEP given    Time  2    Period  Weeks    Status  New    Target  Date  11/06/18        PT Long Term Goals - 10/23/18 1509      PT LONG TERM GOAL #1   Title  Patient (> 64 years old) will complete five times sit to stand test in < 15 seconds indicating an increased LE strength and improved balance.    Baseline  7/7: 20 seconds with UE support and one posterior LOB    Time  8    Period  Weeks    Status  New    Target Date  12/18/18      PT LONG TERM GOAL #2   Title  Patient will increase 10 meter walk test to >1.42m/s as to improve gait speed for better community ambulation and to reduce fall risk.    Baseline  7/7: .58 m/s with U step walker    Time  8    Period  Weeks    Status  New    Target Date  12/18/18      PT LONG TERM GOAL #3   Title  Patient will increase Berg Balance score by > 6 points (31/56) to demonstrate decreased fall risk during functional activities.    Baseline  7/7: 25/56    Time  8    Period  Weeks    Status  New    Target Date  12/18/18      PT LONG TERM GOAL #4   Title  Patient will deny any falls over past 4 weeks to demonstrate improved safety awareness at home and in the community.    Baseline  7/7: averages 6 falls/month    Time  8    Period  Weeks    Status  New    Target Date  12/18/18            Plan - 11/14/18 1028    Clinical Impression Statement  Patient presents with low back pain that is relieved with heat pad to area during seated interventions.  Patient has new U step however machine does not have metronome and patient will be receiving a new one soon.  Patient's strength progressing as can be seen in weight increase with leg press. Patient will continue to benefit from skilled physical therapy to improve static and dynamic standing balance and decrease fall risk for improved functional capacity of activities    Personal Factors and Comorbidities  Age;Comorbidity 3+;Past/Current Experience;Time since onset of injury/illness/exacerbation    Comorbidities  athritis, bradycardia, cancer, depression, dysrhythmia, GERD, HTN, Parkinson's, PTSD, SOB, sleep apnea    Examination-Activity Limitations  Bathing;Bed Mobility;Caring for Others;Bend;Dressing;Lift;Locomotion Level;Reach Overhead;Sit;Squat;Stairs;Stand;Toileting;Transfers    Examination-Participation Restrictions  Church;Cleaning;Community Activity;Interpersonal Relationship;Shop;Volunteer;Yard Work;Other    Stability/Clinical Decision Making  Unstable/Unpredictable    Rehab Potential  Fair    PT Frequency  2x / week    PT Duration  8 weeks    PT Treatment/Interventions  ADLs/Self Care Home Management;Aquatic Therapy;Cryotherapy;Electrical Stimulation;Iontophoresis 4mg /ml Dexamethasone;Moist Heat;Ultrasound;Contrast Bath;DME Instruction;Gait training;Stair training;Functional mobility training;Therapeutic activities;Therapeutic exercise;Balance training;Neuromuscular re-education;Patient/family education;Manual techniques;Wheelchair mobility training;Energy conservation;Passive range of motion;Traction;Orthotic Fit/Training    PT Next Visit Plan  review HEP, airex pad, dynamic stability    PT Home Exercise Plan  see above    Consulted and Agree with Plan of Care  Patient;Family member/caregiver    Family Member Consulted  wife       Patient will benefit from skilled therapeutic intervention in order to improve the following deficits and impairments:  Abnormal gait, Decreased balance,  Decreased coordination, Decreased mobility,  Impaired tone, Postural dysfunction, Decreased strength, Decreased safety awareness, Improper body mechanics, Impaired flexibility, Decreased activity tolerance, Decreased endurance, Decreased knowledge of precautions, Difficulty walking, Pain  Visit Diagnosis: 1. Other abnormalities of gait and mobility   2. Unsteadiness on feet   3. Muscle weakness (generalized)        Problem List Patient Active Problem List   Diagnosis Date Noted  . Rib pain on right side 09/25/2018  . Skin sore 12/01/2017  . Hematoma 10/18/2017  . Accidental fall 10/18/2017  . 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 Radicular pain in right arm 10/09/2016  . GERD (gastroesophageal reflux disease) 07/31/2016  . Trochanteric bursitis 03/03/2016  . Left hand pain 10/30/2015  . Fracture, finger, multiple sites 10/30/2015  . Colon cancer screening 07/23/2015  . Healthcare maintenance 07/23/2015  . Gout 02/18/2015  . Depression 01/06/2015  . Irritation of eyelid 08/20/2014  . Dupuytren's contracture 08/20/2014  . Cough 07/21/2014  . Lumbar stenosis with neurogenic claudication 06/13/2014  . Preop cardiovascular exam 05/28/2014  . S/P deep brain stimulator placement 05/08/2014  . Joint pain 01/22/2014  . Medicare annual wellness visit, initial 01/19/2014  . Advance care planning 01/19/2014  . Parkinson's disease ([place]) 12/13/2013  . PTSD (post-traumatic stress disorder) 06/13/2013  . Erectile dysfunction 06/13/2013  . HLD (hyperlipidemia) 06/13/2013  . Hip pain 06/10/2013  . Pain in joint, shoulder region 06/10/2013  . Right leg swelling 06/03/2013  . Essential hypertension 06/03/2013  . Bradycardia by electrocardiogram 06/03/2013  . Obstructive sleep apnea 03/12/2013  . Spinal stenosis, lumbar region, with neurogenic claudication 12/14/2012   Janna Arch, PT, DPT   11/14/2018, 11:04 AM  [place] MAIN [facility] SERVICES [address] [place], [place], [address] Phone: [phone]   Fax:  [phone]  Name: [name] MRN: [id number] Date of Birth: [birth year]

## 2018-11-19 ENCOUNTER — Ambulatory Visit: Payer: Medicare PPO | Attending: Physician Assistant

## 2018-11-19 ENCOUNTER — Other Ambulatory Visit: Payer: Self-pay

## 2018-11-19 DIAGNOSIS — R279 Unspecified lack of coordination: Secondary | ICD-10-CM | POA: Insufficient documentation

## 2018-11-19 DIAGNOSIS — R296 Repeated falls: Secondary | ICD-10-CM | POA: Insufficient documentation

## 2018-11-19 DIAGNOSIS — R29818 Other symptoms and signs involving the nervous system: Secondary | ICD-10-CM | POA: Diagnosis not present

## 2018-11-19 DIAGNOSIS — R262 Difficulty in walking, not elsewhere classified: Secondary | ICD-10-CM | POA: Diagnosis not present

## 2018-11-19 DIAGNOSIS — R2681 Unsteadiness on feet: Secondary | ICD-10-CM | POA: Insufficient documentation

## 2018-11-19 DIAGNOSIS — M6281 Muscle weakness (generalized): Secondary | ICD-10-CM | POA: Diagnosis not present

## 2018-11-19 DIAGNOSIS — R2689 Other abnormalities of gait and mobility: Secondary | ICD-10-CM | POA: Diagnosis not present

## 2018-11-19 DIAGNOSIS — R278 Other lack of coordination: Secondary | ICD-10-CM | POA: Diagnosis not present

## 2018-11-19 DIAGNOSIS — R293 Abnormal posture: Secondary | ICD-10-CM | POA: Insufficient documentation

## 2018-11-19 DIAGNOSIS — R29898 Other symptoms and signs involving the musculoskeletal system: Secondary | ICD-10-CM | POA: Diagnosis not present

## 2018-11-19 DIAGNOSIS — G20 Parkinson's disease: Secondary | ICD-10-CM | POA: Diagnosis present

## 2018-11-19 NOTE — Therapy (Signed)
[place] MAIN [facility] SERVICES [address] [place], [place], [address] Phone: [phone]   Fax:  [phone]  Physical Therapy Treatment  Patient Details  Name: [name] MRN: [id number] Date of Birth: 1951/03/24 Referring Provider (PT): Gillermo Murdoch, NP   Encounter Date: 11/19/2018  PT End of Session - 11/19/18 0951    Visit Number  7    Number of Visits  16    Date for PT Re-Evaluation  12/18/18    Authorization Type  7/10 eval start 10/23/18    PT Start Time  0944    PT Stop Time  1029    PT Time Calculation (min)  45 min    Equipment Utilized During Treatment  Gait belt    Activity Tolerance  Patient tolerated treatment well;Patient limited by pain    Behavior During Therapy  [facility] for tasks assessed/performed       Past Medical History:  Diagnosis Date  . Arthritis   . Bradycardia   . Cancer [facility]) 2013   skin cancer  . Depression    ptsd  . Dysrhythmia    chronic slow heart rate  . GERD (gastroesophageal reflux disease)   . Headache(784.0)    tension headaches non recent  . History of chicken pox   . History of kidney stones    passed  . Hypertension    treated with HCTZ  . Parkinson's disease ([place])    dx'ed 15 years ago  . PTSD (post-traumatic stress disorder)   . Shortness of breath dyspnea   . Sleep apnea    doesn't use C-pap  . Varicose veins     Past Surgical History:  Procedure Laterality Date  . CHOLECYSTECTOMY N/A 10/22/2014   Procedure: LAPAROSCOPIC CHOLECYSTECTOMY WITH INTRAOPERATIVE CHOLANGIOGRAM;  Surgeon: Dia Crawford III, MD;  Location: [facility] ORS;  Service: General;  Laterality: N/A;  . cyst removed      from lip as a child  . LUMBAR LAMINECTOMY/DECOMPRESSION MICRODISCECTOMY Bilateral 12/14/2012   Procedure: Bilateral lumbar three-four, four-five decompressive laminotomy/foraminotomy;  Surgeon: Charlie Pitter, MD;  Location: [place] NEURO ORS;  Service: Neurosurgery;  Laterality: Bilateral;  . PULSE  GENERATOR IMPLANT Bilateral 12/13/2013   Procedure: Bilateral implantable pulse generator placement;  Surgeon: Erline Levine, MD;  Location: [place] NEURO ORS;  Service: Neurosurgery;  Laterality: Bilateral;  Bilateral implantable pulse generator placement  . skin cancer removed     from ears,   12 lft arm  rt leg 15  . SUBTHALAMIC STIMULATOR BATTERY REPLACEMENT Bilateral 07/14/2017   Procedure: BILATERAL IMPLANTED PULSE GENERATOR CHANGE FOR DEEP BRAIN STIMULATOR;  Surgeon: Erline Levine, MD;  Location: [place];  Service: Neurosurgery;  Laterality: Bilateral;  . SUBTHALAMIC STIMULATOR INSERTION Bilateral 12/06/2013   Procedure: SUBTHALAMIC STIMULATOR INSERTION;  Surgeon: Erline Levine, MD;  Location: [place] NEURO ORS;  Service: Neurosurgery;  Laterality: Bilateral;  Bilateral deep brain stimulator placement    There were no vitals filed for this visit.  Subjective Assessment - 11/19/18 0949    Subjective  Patient reports he slipped on Saturday when reaching to pick something up off the floor. Was wearing socks and his feet slipped from under him bringing him to his knees. His knees and back are sore today.    Pertinent History  Patient is a pleasant 67 year old male who presents for for Parkinson's. Patient is awaiting disability hearing. He has an aide 14 hours/week and uses a walker/u step at baseline. Patient has been seen for physical  therapy at this clinic last year and was discharged to home health due to increasing fall risk and limited ability to negotiate home environment.  At time of discharge this physical therapist was advised to not encourage patient to ambulate and to use wheelchair by referring physician. Patient has since switched physicians to a neurosurgeon at [facility] who altered his deep brain stimulator with positive effects in January and re-adjusted it again about a month ago. Is now allowed to walk and encouraged to do so as frequently as possible. Patient continues to have balance deficits having  approximately 5-6 falls a month. PMH includes: Parkinson's, HTN, GERD, HLD, spinal stenosis, PTSD, Deep brain stimulator placement, cancer, and bradycardia.    Limitations  Walking;Standing;Sitting;House hold activities;Lifting    How long can you sit comfortably?  hours    How long can you stand comfortably?  3 minutes but varies    How long can you walk comfortably?  1-2 minutes    Patient Stated Goals  to stand and walk for longer distances for exercises.    Currently in Pain?  Yes    Pain Score  7     Pain Location  Back    Pain Orientation  Lower    Pain Descriptors / Indicators  Squeezing;Throbbing    Pain Type  Acute pain    Pain Radiating Towards  both legs, Right more than left    Pain Onset  In the past 7 days    Pain Frequency  Constant          Treatment:      -Nustep L4, 4 minutes RPM> 60 for cardiovascular challenge/rhythmic reciprocating pattern of muscle recruitment.      TherEx:  -Quantum leg press # 150; 12x ; 2 sets second set #165, cueing for sequencing and slowing velocity for maximal muscle recruitment   -Quantum leg press, single limb #90 12x each limb; 2 sets useing for sequencing and slow velocity for maximal muscle recruitment   -seated adduction ball  squeezes between feet with LAQ  15x   -seated heel toe raises 15x with increasing velocity for muscle activation and sequencing.    Neuro Re-ed : Patient requires CGA and tactile cueing for upright posture in all standing interventions due to forward trunk lean resulting in near LOB.  airex pad: static stand 60 seconds with Min A to LLE to maintain extended knee position x2 trials  Sit to stands from chair with airex pad under feet x10   2" step seated toe taps for coordination/sequencing 30 seconds x 2 trials for spatial awareness and muscle activation  Standing step over orange hurdle 10x, blocking of stance knee due to decreased knee extension with weight acceptance   Pt. response to medical  necessity: Patient will continue to benefit from skilled physical therapy to improve static and dynamic standing balance and decrease fall risk for improved functional capacity of activities                     PT Education - 11/19/18 0951    Education provided  Yes    Education Details  exercise technique, stability, body mechanics    Person(s) Educated  Patient    Methods  Explanation;Demonstration;Tactile cues;Verbal cues    Comprehension  Verbalized understanding;Returned demonstration;Verbal cues required;Tactile cues required       PT Short Term Goals - 10/23/18 1243      PT SHORT TERM GOAL #1   Title  Pt will perform HEP  with family's supervision, for improved balance, transfers, and gait.      Baseline  7/7 HEP given    Time  2    Period  Weeks    Status  New    Target Date  11/06/18        PT Long Term Goals - 10/23/18 1509      PT LONG TERM GOAL #1   Title  Patient (> 5 years old) will complete five times sit to stand test in < 15 seconds indicating an increased LE strength and improved balance.    Baseline  7/7: 20 seconds with UE support and one posterior LOB    Time  8    Period  Weeks    Status  New    Target Date  12/18/18      PT LONG TERM GOAL #2   Title  Patient will increase 10 meter walk test to >1.21m/s as to improve gait speed for better community ambulation and to reduce fall risk.    Baseline  7/7: .58 m/s with U step walker    Time  8    Period  Weeks    Status  New    Target Date  12/18/18      PT LONG TERM GOAL #3   Title  Patient will increase Berg Balance score by > 6 points (31/56) to demonstrate decreased fall risk during functional activities.    Baseline  7/7: 25/56    Time  8    Period  Weeks    Status  New    Target Date  12/18/18      PT LONG TERM GOAL #4   Title  Patient will deny any falls over past 4 weeks to demonstrate improved safety awareness at home and in the community.    Baseline  7/7: averages 6  falls/month    Time  8    Period  Weeks    Status  New    Target Date  12/18/18            Plan - 11/19/18 4818    Clinical Impression Statement  Patient presents to physical therapy with slightly flat affect. Use of heat pad on back during seated interventions reduced low back pain allowing for continuation of interventions. Patient is challenged maintaining knee extension upon weightbearing stage of ambulation with RLE impacted more than LLE today. Patient will benefit from skilled physical therapy to increase stability, strength, mobility, and posture to decrease falls risk and improve quality of life.    Personal Factors and Comorbidities  Age;Comorbidity 3+;Past/Current Experience;Time since onset of injury/illness/exacerbation    Comorbidities  athritis, bradycardia, cancer, depression, dysrhythmia, GERD, HTN, Parkinson's, PTSD, SOB, sleep apnea    Examination-Activity Limitations  Bathing;Bed Mobility;Caring for Others;Bend;Dressing;Lift;Locomotion Level;Reach Overhead;Sit;Squat;Stairs;Stand;Toileting;Transfers    Examination-Participation Restrictions  Church;Cleaning;Community Activity;Interpersonal Relationship;Shop;Volunteer;Yard Work;Other    Stability/Clinical Decision Making  Unstable/Unpredictable    Rehab Potential  Fair    PT Frequency  2x / week    PT Duration  8 weeks    PT Treatment/Interventions  ADLs/Self Care Home Management;Aquatic Therapy;Cryotherapy;Electrical Stimulation;Iontophoresis 4mg /ml Dexamethasone;Moist Heat;Ultrasound;Contrast Bath;DME Instruction;Gait training;Stair training;Functional mobility training;Therapeutic activities;Therapeutic exercise;Balance training;Neuromuscular re-education;Patient/family education;Manual techniques;Wheelchair mobility training;Energy conservation;Passive range of motion;Traction;Orthotic Fit/Training    PT Next Visit Plan  review HEP, airex pad, dynamic stability    PT Home Exercise Plan  see above    Consulted and Agree  with Plan of Care  Patient;Family member/caregiver    Family Member Consulted  wife       Patient will benefit from skilled therapeutic intervention in order to improve the following deficits and impairments:  Abnormal gait, Decreased balance, Decreased coordination, Decreased mobility, Impaired tone, Postural dysfunction, Decreased strength, Decreased safety awareness, Improper body mechanics, Impaired flexibility, Decreased activity tolerance, Decreased endurance, Decreased knowledge of precautions, Difficulty walking, Pain  Visit Diagnosis: 1. Other abnormalities of gait and mobility   2. Unsteadiness on feet   3. Muscle weakness (generalized)        Problem List Patient Active Problem List   Diagnosis Date Noted  . Rib pain on right side 09/25/2018  . Skin sore 12/01/2017  . Hematoma 10/18/2017  . Accidental fall 10/18/2017  . 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 Radicular pain in right arm 10/09/2016  . GERD (gastroesophageal reflux disease) 07/31/2016  . Trochanteric bursitis 03/03/2016  . Left hand pain 10/30/2015  . Fracture, finger, multiple sites 10/30/2015  . Colon cancer screening 07/23/2015  . Healthcare maintenance 07/23/2015  . Gout 02/18/2015  . Depression 01/06/2015  . Irritation of eyelid 08/20/2014  . Dupuytren's contracture 08/20/2014  . Cough 07/21/2014  . Lumbar stenosis with neurogenic claudication 06/13/2014  . Preop cardiovascular exam 05/28/2014  . S/P deep brain stimulator placement 05/08/2014  . Joint pain 01/22/2014  . Medicare annual wellness visit, initial 01/19/2014  . Advance care planning 01/19/2014  . Parkinson's disease ([place]) 12/13/2013  . PTSD (post-traumatic stress disorder) 06/13/2013  . Erectile dysfunction 06/13/2013  . HLD (hyperlipidemia) 06/13/2013  . Hip pain 06/10/2013  . Pain in joint, shoulder region 06/10/2013  . Right leg swelling 06/03/2013  . Essential hypertension 06/03/2013  . Bradycardia by electrocardiogram  06/03/2013  . Obstructive sleep apnea 03/12/2013  . Spinal stenosis, lumbar region, with neurogenic claudication 12/14/2012   Janna Arch, PT, DPT   11/19/2018, 10:32 AM  [place] MAIN [facility] SERVICES [address] [place], [place], [address] Phone: [phone]   Fax:  [phone]  Name: [name] MRN: [id number] Date of Birth: 01/19/1951

## 2018-11-21 ENCOUNTER — Other Ambulatory Visit: Payer: Self-pay

## 2018-11-21 ENCOUNTER — Ambulatory Visit: Payer: Medicare PPO

## 2018-11-21 DIAGNOSIS — R2689 Other abnormalities of gait and mobility: Secondary | ICD-10-CM

## 2018-11-21 DIAGNOSIS — R293 Abnormal posture: Secondary | ICD-10-CM

## 2018-11-21 DIAGNOSIS — R29898 Other symptoms and signs involving the musculoskeletal system: Secondary | ICD-10-CM | POA: Diagnosis not present

## 2018-11-21 DIAGNOSIS — R296 Repeated falls: Secondary | ICD-10-CM | POA: Diagnosis not present

## 2018-11-21 DIAGNOSIS — R2681 Unsteadiness on feet: Secondary | ICD-10-CM | POA: Diagnosis not present

## 2018-11-21 DIAGNOSIS — M6281 Muscle weakness (generalized): Secondary | ICD-10-CM | POA: Diagnosis not present

## 2018-11-21 DIAGNOSIS — R262 Difficulty in walking, not elsewhere classified: Secondary | ICD-10-CM | POA: Diagnosis not present

## 2018-11-21 DIAGNOSIS — R278 Other lack of coordination: Secondary | ICD-10-CM

## 2018-11-21 DIAGNOSIS — R279 Unspecified lack of coordination: Secondary | ICD-10-CM

## 2018-11-21 DIAGNOSIS — R29818 Other symptoms and signs involving the nervous system: Secondary | ICD-10-CM

## 2018-11-21 DIAGNOSIS — G20A1 Parkinson's disease without dyskinesia, without mention of fluctuations: Secondary | ICD-10-CM

## 2018-11-21 DIAGNOSIS — G20 Parkinson's disease: Secondary | ICD-10-CM

## 2018-11-21 NOTE — Therapy (Signed)
[place] MAIN [facility] SERVICES [address] [place], [place], [address] Phone: [phone]   Fax:  [phone]  Physical Therapy Treatment  Patient Details  Name: [name] MRN: [id number] Date of Birth: 1951-01-04 Referring Provider (PT): Gillermo Murdoch, NP   Encounter Date: 11/21/2018  PT End of Session - 11/21/18 0930    Visit Number  8    Number of Visits  16    Date for PT Re-Evaluation  12/18/18    Authorization Type  8/10 eval start 10/23/18    PT Start Time  0931    PT Stop Time  1015    PT Time Calculation (min)  44 min    Equipment Utilized During Treatment  Gait belt    Activity Tolerance  Patient tolerated treatment well;Patient limited by pain    Behavior During Therapy  [facility] for tasks assessed/performed       Past Medical History:  Diagnosis Date  . Arthritis   . Bradycardia   . Cancer [facility]) 2013   skin cancer  . Depression    ptsd  . Dysrhythmia    chronic slow heart rate  . GERD (gastroesophageal reflux disease)   . Headache(784.0)    tension headaches non recent  . History of chicken pox   . History of kidney stones    passed  . Hypertension    treated with HCTZ  . Parkinson's disease ([place])    dx'ed 15 years ago  . PTSD (post-traumatic stress disorder)   . Shortness of breath dyspnea   . Sleep apnea    doesn't use C-pap  . Varicose veins     Past Surgical History:  Procedure Laterality Date  . CHOLECYSTECTOMY N/A 10/22/2014   Procedure: LAPAROSCOPIC CHOLECYSTECTOMY WITH INTRAOPERATIVE CHOLANGIOGRAM;  Surgeon: Dia Crawford III, MD;  Location: [facility] ORS;  Service: General;  Laterality: N/A;  . cyst removed      from lip as a child  . LUMBAR LAMINECTOMY/DECOMPRESSION MICRODISCECTOMY Bilateral 12/14/2012   Procedure: Bilateral lumbar three-four, four-five decompressive laminotomy/foraminotomy;  Surgeon: Charlie Pitter, MD;  Location: [place] NEURO ORS;  Service: Neurosurgery;  Laterality: Bilateral;  . PULSE  GENERATOR IMPLANT Bilateral 12/13/2013   Procedure: Bilateral implantable pulse generator placement;  Surgeon: Erline Levine, MD;  Location: [place] NEURO ORS;  Service: Neurosurgery;  Laterality: Bilateral;  Bilateral implantable pulse generator placement  . skin cancer removed     from ears,   12 lft arm  rt leg 15  . SUBTHALAMIC STIMULATOR BATTERY REPLACEMENT Bilateral 07/14/2017   Procedure: BILATERAL IMPLANTED PULSE GENERATOR CHANGE FOR DEEP BRAIN STIMULATOR;  Surgeon: Erline Levine, MD;  Location: [place];  Service: Neurosurgery;  Laterality: Bilateral;  . SUBTHALAMIC STIMULATOR INSERTION Bilateral 12/06/2013   Procedure: SUBTHALAMIC STIMULATOR INSERTION;  Surgeon: Erline Levine, MD;  Location: [place] NEURO ORS;  Service: Neurosurgery;  Laterality: Bilateral;  Bilateral deep brain stimulator placement    There were no vitals filed for this visit.  Subjective Assessment - 11/21/18 0936    Subjective  Patient doesn't remember any falls/stumbles since last session. Stated he is not doing his exercises at home, he stopped doing them because the computer lost it all.    Patient is accompained by:  Family member    Pertinent History  Patient is a pleasant 68 year old male who presents for for Parkinson's. Patient is awaiting disability hearing. He has an aide 14 hours/week and uses a walker/u step at baseline. Patient has been seen for physical  therapy at this clinic last year and was discharged to home health due to increasing fall risk and limited ability to negotiate home environment.  At time of discharge this physical therapist was advised to not encourage patient to ambulate and to use wheelchair by referring physician. Patient has since switched physicians to a neurosurgeon at [facility] who altered his deep brain stimulator with positive effects in January and re-adjusted it again about a month ago. Is now allowed to walk and encouraged to do so as frequently as possible. Patient continues to have balance deficits  having approximately 5-6 falls a month. PMH includes: Parkinson's, HTN, GERD, HLD, spinal stenosis, PTSD, Deep brain stimulator placement, cancer, and bradycardia.    Limitations  Walking;Standing;Sitting;House hold activities;Lifting    How long can you sit comfortably?  hours    How long can you stand comfortably?  3 minutes but varies    How long can you walk comfortably?  1-2 minutes    Patient Stated Goals  to stand and walk for longer distances for exercises.    Currently in Pain?  No/denies       Treatment:      -Nustep L4, 4 minutes RPM> 60 for cardiovascular challenge/rhythmic reciprocating pattern of muscle recruitment.    TherEx:  -Quantum leg press # 150; 12x ; 2 sets second set #165, cueing for sequencing and slowing velocity for maximal muscle recruitment   -Quantum leg press, single limb #100 12x and then 10x second set each limb; 2 sets using for sequencing and slow velocity for maximal muscle recruitment     Neuro Re-ed : Patient requires CGA and tactile cueing for upright posture in all standing interventions due to posterior trunk lean resulting in near LOB.   airex pad: static stand 60 seconds with Min A to LLE to maintain extended knee position x2 trials  Feet apart vertical head turns cues for eyes and head movements some trembling noted x2 trails 60sec holds   Sit to stands from chair with airex pad under feet x10 decreased UE support to unilateral at 5 reps, mina to control descent to chair, cues for knee extension in standing 1 LOB noted, modA to return to chair    2" step seated toe taps for coordination/sequencing 30 seconds x 2 trials for spatial awareness and muscle activation   Standing step over orange hurdle 10x, blocking of stance knee due to decreased knee extension with weight acceptance   Pt. response to medical necessity/clinical impression: Patient continued to demonstrate instability on unstable surfaces and required minA from PT to block stance  leg in dynamic balance activities. Exhibited posterior lean this session throughout standing exercises, at least unilateral UE support needed to address balance deficits/safety. 1 LOB noted during sit to stand on pliable surface, modA from PT to correct. The patient remained highly motivated to participate and progress his exercises and would benefit from further skilled PT intervention.     PT Education - 11/21/18 [phone]    Education provided  Yes    Education Details  exercise technique, stability, body mechanics    Person(s) Educated  Patient    Methods  Explanation;Demonstration;Tactile cues;Verbal cues    Comprehension  Verbalized understanding;Returned demonstration;Verbal cues required;Tactile cues required       PT Short Term Goals - 11/21/18 0939      PT SHORT TERM GOAL #1   Title  Pt will perform HEP with family's supervision, for improved balance, transfers, and gait.  Baseline  7/7 HEP given; 8/5 pt reported that his is not performing his HEP consistently, still has his paper copy    Time  2    Period  Weeks    Status  On-going    Target Date  12/05/18        PT Long Term Goals - 10/23/18 1509      PT LONG TERM GOAL #1   Title  Patient (> 58 years old) will complete five times sit to stand test in < 15 seconds indicating an increased LE strength and improved balance.    Baseline  7/7: 20 seconds with UE support and one posterior LOB    Time  8    Period  Weeks    Status  New    Target Date  12/18/18      PT LONG TERM GOAL #2   Title  Patient will increase 10 meter walk test to >1.84m/s as to improve gait speed for better community ambulation and to reduce fall risk.    Baseline  7/7: .58 m/s with U step walker    Time  8    Period  Weeks    Status  New    Target Date  12/18/18      PT LONG TERM GOAL #3   Title  Patient will increase Berg Balance score by > 6 points (31/56) to demonstrate decreased fall risk during functional activities.    Baseline  7/7:  25/56    Time  8    Period  Weeks    Status  New    Target Date  12/18/18      PT LONG TERM GOAL #4   Title  Patient will deny any falls over past 4 weeks to demonstrate improved safety awareness at home and in the community.    Baseline  7/7: averages 6 falls/month    Time  8    Period  Weeks    Status  New    Target Date  12/18/18            Plan - 11/21/18 1012    Clinical Impression Statement  Patient continued to demonstrate instability on unstable surfaces and required minA from PT to block stance leg in dynamic balance activities. Exhibited posterior lean this session throughout standing exercises, at least unilateral UE support needed to address balance deficits/safety. 1 LOB noted during sit to stand on pliable surface, modA from PT to correct. The patient remained highly motivated to participate and progress his exercises and would benefit from further skilled PT intervention.    Personal Factors and Comorbidities  Age;Comorbidity 3+;Past/Current Experience;Time since onset of injury/illness/exacerbation    Comorbidities  athritis, bradycardia, cancer, depression, dysrhythmia, GERD, HTN, Parkinson's, PTSD, SOB, sleep apnea    Examination-Activity Limitations  Bathing;Bed Mobility;Caring for Others;Bend;Dressing;Lift;Locomotion Level;Reach Overhead;Sit;Squat;Stairs;Stand;Toileting;Transfers    Examination-Participation Restrictions  Church;Cleaning;Community Activity;Interpersonal Relationship;Shop;Volunteer;Yard Work;Other    Stability/Clinical Decision Making  Unstable/Unpredictable    Rehab Potential  Fair    PT Frequency  2x / week    PT Duration  8 weeks    PT Treatment/Interventions  ADLs/Self Care Home Management;Aquatic Therapy;Cryotherapy;Electrical Stimulation;Iontophoresis 4mg /ml Dexamethasone;Moist Heat;Ultrasound;Contrast Bath;DME Instruction;Gait training;Stair training;Functional mobility training;Therapeutic activities;Therapeutic exercise;Balance  training;Neuromuscular re-education;Patient/family education;Manual techniques;Wheelchair mobility training;Energy conservation;Passive range of motion;Traction;Orthotic Fit/Training    PT Next Visit Plan  review HEP, airex pad, dynamic stability    PT Home Exercise Plan  see above    Consulted and Agree with Plan of Care  Patient;Family member/caregiver  Family Member Consulted  wife       Patient will benefit from skilled therapeutic intervention in order to improve the following deficits and impairments:  Abnormal gait, Decreased balance, Decreased coordination, Decreased mobility, Impaired tone, Postural dysfunction, Decreased strength, Decreased safety awareness, Improper body mechanics, Impaired flexibility, Decreased activity tolerance, Decreased endurance, Decreased knowledge of precautions, Difficulty walking, Pain  Visit Diagnosis: 1. Other abnormalities of gait and mobility   2. Unsteadiness on feet   3. Difficulty walking   4. Abnormal posture   5. Other symptoms and signs involving the nervous system   6. Muscle weakness (generalized)   7. Other symptoms and signs involving the musculoskeletal system   8. Repeated falls   9. Other lack of coordination   10. Unspecified lack of coordination   11. Parkinson's disease [facility])        Problem List Patient Active Problem List   Diagnosis Date Noted  . Rib pain on right side 09/25/2018  . Skin sore 12/01/2017  . Hematoma 10/18/2017  . Accidental fall 10/18/2017  . 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 Radicular pain in right arm 10/09/2016  . GERD (gastroesophageal reflux disease) 07/31/2016  . Trochanteric bursitis 03/03/2016  . Left hand pain 10/30/2015  . Fracture, finger, multiple sites 10/30/2015  . Colon cancer screening 07/23/2015  . Healthcare maintenance 07/23/2015  . Gout 02/18/2015  . Depression 01/06/2015  . Irritation of eyelid 08/20/2014  . Dupuytren's contracture 08/20/2014  . Cough 07/21/2014  .  Lumbar stenosis with neurogenic claudication 06/13/2014  . Preop cardiovascular exam 05/28/2014  . S/P deep brain stimulator placement 05/08/2014  . Joint pain 01/22/2014  . Medicare annual wellness visit, initial 01/19/2014  . Advance care planning 01/19/2014  . Parkinson's disease ([place]) 12/13/2013  . PTSD (post-traumatic stress disorder) 06/13/2013  . Erectile dysfunction 06/13/2013  . HLD (hyperlipidemia) 06/13/2013  . Hip pain 06/10/2013  . Pain in joint, shoulder region 06/10/2013  . Right leg swelling 06/03/2013  . Essential hypertension 06/03/2013  . Bradycardia by electrocardiogram 06/03/2013  . Obstructive sleep apnea 03/12/2013  . Spinal stenosis, lumbar region, with neurogenic claudication 12/14/2012    Lieutenant Diego PT, DPT 10:37 AM,11/21/18 [place] MAIN [facility] SERVICES [address] [place], [place], [address] Phone: [phone]   Fax:  [phone]  Name: ANDI MAHAFFY MRN: [id number] Date of Birth: 03/29/1951

## 2018-11-26 ENCOUNTER — Ambulatory Visit: Payer: Medicare PPO

## 2018-11-26 ENCOUNTER — Ambulatory Visit: Payer: Medicare PPO | Admitting: Psychology

## 2018-11-26 ENCOUNTER — Other Ambulatory Visit: Payer: Self-pay

## 2018-11-26 DIAGNOSIS — R296 Repeated falls: Secondary | ICD-10-CM | POA: Diagnosis not present

## 2018-11-26 DIAGNOSIS — R278 Other lack of coordination: Secondary | ICD-10-CM | POA: Diagnosis not present

## 2018-11-26 DIAGNOSIS — R262 Difficulty in walking, not elsewhere classified: Secondary | ICD-10-CM

## 2018-11-26 DIAGNOSIS — M6281 Muscle weakness (generalized): Secondary | ICD-10-CM | POA: Diagnosis not present

## 2018-11-26 DIAGNOSIS — R29898 Other symptoms and signs involving the musculoskeletal system: Secondary | ICD-10-CM | POA: Diagnosis not present

## 2018-11-26 DIAGNOSIS — R2681 Unsteadiness on feet: Secondary | ICD-10-CM | POA: Diagnosis not present

## 2018-11-26 DIAGNOSIS — R293 Abnormal posture: Secondary | ICD-10-CM | POA: Diagnosis not present

## 2018-11-26 DIAGNOSIS — R2689 Other abnormalities of gait and mobility: Secondary | ICD-10-CM | POA: Diagnosis not present

## 2018-11-26 DIAGNOSIS — R29818 Other symptoms and signs involving the nervous system: Secondary | ICD-10-CM | POA: Diagnosis not present

## 2018-11-26 NOTE — Therapy (Signed)
[place] MAIN [facility] SERVICES [address] [place], [place], [address] Phone: [phone]   Fax:  [phone]  Physical Therapy Treatment  Patient Details  Name: [name] MRN: [id number] Date of Birth: May 07, 1950 Referring Provider (PT): Gillermo Murdoch, NP   Encounter Date: 11/26/2018  PT End of Session - 11/26/18 1015    Visit Number  9    Number of Visits  16    Date for PT Re-Evaluation  12/18/18    Authorization Type  9/10 eval start 10/23/18    PT Start Time  0925    PT Stop Time  1011    PT Time Calculation (min)  46 min    Equipment Utilized During Treatment  Gait belt    Activity Tolerance  Patient tolerated treatment well;Patient limited by pain    Behavior During Therapy  [facility] for tasks assessed/performed       Past Medical History:  Diagnosis Date  . Arthritis   . Bradycardia   . Cancer [facility]) 2013   skin cancer  . Depression    ptsd  . Dysrhythmia    chronic slow heart rate  . GERD (gastroesophageal reflux disease)   . Headache(784.0)    tension headaches non recent  . History of chicken pox   . History of kidney stones    passed  . Hypertension    treated with HCTZ  . Parkinson's disease ([place])    dx'ed 15 years ago  . PTSD (post-traumatic stress disorder)   . Shortness of breath dyspnea   . Sleep apnea    doesn't use C-pap  . Varicose veins     Past Surgical History:  Procedure Laterality Date  . CHOLECYSTECTOMY N/A 10/22/2014   Procedure: LAPAROSCOPIC CHOLECYSTECTOMY WITH INTRAOPERATIVE CHOLANGIOGRAM;  Surgeon: Dia Crawford III, MD;  Location: [facility] ORS;  Service: General;  Laterality: N/A;  . cyst removed      from lip as a child  . LUMBAR LAMINECTOMY/DECOMPRESSION MICRODISCECTOMY Bilateral 12/14/2012   Procedure: Bilateral lumbar three-four, four-five decompressive laminotomy/foraminotomy;  Surgeon: Charlie Pitter, MD;  Location: [place] NEURO ORS;  Service: Neurosurgery;  Laterality: Bilateral;  . PULSE  GENERATOR IMPLANT Bilateral 12/13/2013   Procedure: Bilateral implantable pulse generator placement;  Surgeon: Erline Levine, MD;  Location: [place] NEURO ORS;  Service: Neurosurgery;  Laterality: Bilateral;  Bilateral implantable pulse generator placement  . skin cancer removed     from ears,   12 lft arm  rt leg 15  . SUBTHALAMIC STIMULATOR BATTERY REPLACEMENT Bilateral 07/14/2017   Procedure: BILATERAL IMPLANTED PULSE GENERATOR CHANGE FOR DEEP BRAIN STIMULATOR;  Surgeon: Erline Levine, MD;  Location: [place];  Service: Neurosurgery;  Laterality: Bilateral;  . SUBTHALAMIC STIMULATOR INSERTION Bilateral 12/06/2013   Procedure: SUBTHALAMIC STIMULATOR INSERTION;  Surgeon: Erline Levine, MD;  Location: [place] NEURO ORS;  Service: Neurosurgery;  Laterality: Bilateral;  Bilateral deep brain stimulator placement    There were no vitals filed for this visit.  Subjective Assessment - 11/26/18 0926    Subjective  Patient reports his new U step was the wrong size and so he is still waiting for it. Reports he hasn't done his exercises since his computers messed up. Wife reports patient fell twice mowing the lawn.    Patient is accompained by:  Family member    Pertinent History  Patient is a pleasant 68 year old male who presents for for Parkinson's. Patient is awaiting disability hearing. He has an aide 14 hours/week and uses a  walker/u step at baseline. Patient has been seen for physical therapy at this clinic last year and was discharged to home health due to increasing fall risk and limited ability to negotiate home environment.  At time of discharge this physical therapist was advised to not encourage patient to ambulate and to use wheelchair by referring physician. Patient has since switched physicians to a neurosurgeon at [facility] who altered his deep brain stimulator with positive effects in January and re-adjusted it again about a month ago. Is now allowed to walk and encouraged to do so as frequently as possible. Patient  continues to have balance deficits having approximately 5-6 falls a month. PMH includes: Parkinson's, HTN, GERD, HLD, spinal stenosis, PTSD, Deep brain stimulator placement, cancer, and bradycardia.    Limitations  Walking;Standing;Sitting;House hold activities;Lifting    How long can you sit comfortably?  hours    How long can you stand comfortably?  3 minutes but varies    How long can you walk comfortably?  1-2 minutes    Patient Stated Goals  to stand and walk for longer distances for exercises.    Currently in Pain?  Yes    Pain Score  4     Pain Location  Back    Pain Orientation  Lower    Pain Descriptors / Indicators  Aching    Pain Type  Chronic pain    Pain Onset  In the past 7 days    Pain Frequency  Intermittent     Reports two falls when mowing the lawn.      Treatment:       -Nustep L4, 4 minutes RPM> 60 for cardiovascular challenge/rhythmic reciprocating pattern of muscle recruitment.  HEP:  Access Code: [email or web address]  URL: [email or web address]  Date: 11/26/2018  Prepared by: Janna Arch   Exercises Seated Long Arc Quad - 10 reps - 2 sets - 5 hold - 1x daily - 7x weekly Active Straight Leg Raise with Quad Set - 10 reps - 2 sets - 5 hold - 1x daily - 7x weekly Standing Hip Flexion AROM - 10 reps - 2 sets - 5 hold - 1x daily - 7x weekly Sit to Stand with Counter Support - 10 reps - 2 sets - 5 hold - 1x daily - 7x weekly Seated Small Alternating Straight Leg Lifts - 10 reps - 2 sets - 5 hold - 1x daily - 7x weekly Seated Gluteal Sets - 10 reps - 2 sets - 5 hold - 1x daily - 7x weekly Seated Ankle Alphabet - 2 reps - 2 sets - 5 hold - 1x daily - 7x weekly   Standing single leg high march with Mod A to weight accepting LE to decrease knee flexion. Max cueing for quad contraction with weight acceptance x 12 each LE  2" step seated toe taps for coordination and sequencing with muscle activation and spatial awareness 30 seconds, second set on 4" step for  increased difficulty                            PT Education - 11/26/18 0956    Education provided  Yes    Education Details  HEP, exercise technique    Person(s) Educated  Patient    Methods  Explanation;Tactile cues;Demonstration;Verbal cues    Comprehension  Verbalized understanding;Returned demonstration;Verbal cues required;Tactile cues required       PT Short Term Goals - 11/21/18 0263  PT SHORT TERM GOAL #1   Title  Pt will perform HEP with family's supervision, for improved balance, transfers, and gait.      Baseline  7/7 HEP given; 8/5 pt reported that his is not performing his HEP consistently, still has his paper copy    Time  2    Period  Weeks    Status  On-going    Target Date  12/05/18        PT Long Term Goals - 10/23/18 1509      PT LONG TERM GOAL #1   Title  Patient (> 47 years old) will complete five times sit to stand test in < 15 seconds indicating an increased LE strength and improved balance.    Baseline  7/7: 20 seconds with UE support and one posterior LOB    Time  8    Period  Weeks    Status  New    Target Date  12/18/18      PT LONG TERM GOAL #2   Title  Patient will increase 10 meter walk test to >1.27m/s as to improve gait speed for better community ambulation and to reduce fall risk.    Baseline  7/7: .58 m/s with U step walker    Time  8    Period  Weeks    Status  New    Target Date  12/18/18      PT LONG TERM GOAL #3   Title  Patient will increase Berg Balance score by > 6 points (31/56) to demonstrate decreased fall risk during functional activities.    Baseline  7/7: 25/56    Time  8    Period  Weeks    Status  New    Target Date  12/18/18      PT LONG TERM GOAL #4   Title  Patient will deny any falls over past 4 weeks to demonstrate improved safety awareness at home and in the community.    Baseline  7/7: averages 6 falls/month    Time  8    Period  Weeks    Status  New    Target Date  12/18/18             Plan - 11/26/18 1016    Clinical Impression Statement  Patient given new HEP and demonstrates understanding. New HEP focuses on stabilization and reduction of knee buckling in weight bearing. Patient continues to be challenged with posterior trunk lean with bilateral knee flexion in standing reducing stability. Patient will benefit from skilled physical therapy to increase stability, strength, mobility, and posture to decrease falls risk and improve quality of life    Personal Factors and Comorbidities  Age;Comorbidity 3+;Past/Current Experience;Time since onset of injury/illness/exacerbation    Comorbidities  athritis, bradycardia, cancer, depression, dysrhythmia, GERD, HTN, Parkinson's, PTSD, SOB, sleep apnea    Examination-Activity Limitations  Bathing;Bed Mobility;Caring for Others;Bend;Dressing;Lift;Locomotion Level;Reach Overhead;Sit;Squat;Stairs;Stand;Toileting;Transfers    Examination-Participation Restrictions  Church;Cleaning;Community Activity;Interpersonal Relationship;Shop;Volunteer;Yard Work;Other    Stability/Clinical Decision Making  Unstable/Unpredictable    Rehab Potential  Fair    PT Frequency  2x / week    PT Duration  8 weeks    PT Treatment/Interventions  ADLs/Self Care Home Management;Aquatic Therapy;Cryotherapy;Electrical Stimulation;Iontophoresis 4mg /ml Dexamethasone;Moist Heat;Ultrasound;Contrast Bath;DME Instruction;Gait training;Stair training;Functional mobility training;Therapeutic activities;Therapeutic exercise;Balance training;Neuromuscular re-education;Patient/family education;Manual techniques;Wheelchair mobility training;Energy conservation;Passive range of motion;Traction;Orthotic Fit/Training    PT Next Visit Plan  goals    PT Home Exercise Plan  see above    Consulted and  Agree with Plan of Care  Patient;Family member/caregiver    Family Member Consulted  wife       Patient will benefit from skilled therapeutic intervention in order to  improve the following deficits and impairments:  Abnormal gait, Decreased balance, Decreased coordination, Decreased mobility, Impaired tone, Postural dysfunction, Decreased strength, Decreased safety awareness, Improper body mechanics, Impaired flexibility, Decreased activity tolerance, Decreased endurance, Decreased knowledge of precautions, Difficulty walking, Pain  Visit Diagnosis: 1. Other abnormalities of gait and mobility   2. Unsteadiness on feet   3. Difficulty walking   4. Abnormal posture        Problem List Patient Active Problem List   Diagnosis Date Noted  . Rib pain on right side 09/25/2018  . Skin sore 12/01/2017  . Hematoma 10/18/2017  . Accidental fall 10/18/2017  . 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 Radicular pain in right arm 10/09/2016  . GERD (gastroesophageal reflux disease) 07/31/2016  . Trochanteric bursitis 03/03/2016  . Left hand pain 10/30/2015  . Fracture, finger, multiple sites 10/30/2015  . [place] cancer screening 07/23/2015  . Healthcare maintenance 07/23/2015  . Gout 02/18/2015  . Depression 01/06/2015  . Irritation of eyelid 08/20/2014  . Dupuytren's contracture 08/20/2014  . Cough 07/21/2014  . Lumbar stenosis with neurogenic claudication 06/13/2014  . Preop cardiovascular exam 05/28/2014  . S/P deep brain stimulator placement 05/08/2014  . Joint pain 01/22/2014  . Medicare annual wellness visit, initial 01/19/2014  . Advance care planning 01/19/2014  . Parkinson's disease ([place]) 12/13/2013  . PTSD (post-traumatic stress disorder) 06/13/2013  . Erectile dysfunction 06/13/2013  . HLD (hyperlipidemia) 06/13/2013  . Hip pain 06/10/2013  . Pain in joint, shoulder region 06/10/2013  . Right leg swelling 06/03/2013  . Essential hypertension 06/03/2013  . Bradycardia by electrocardiogram 06/03/2013  . Obstructive sleep apnea 03/12/2013  . Spinal stenosis, lumbar region, with neurogenic claudication 12/14/2012   Janna Arch, PT,  DPT   11/26/2018, 10:18 AM  [place] [address] [place], [place], [address] Phone: [phone]   Fax:  [phone]  Name: [name] MRN: [id number] Date of Birth: 1950/06/01

## 2018-11-28 ENCOUNTER — Ambulatory Visit: Payer: Medicare PPO

## 2018-11-28 ENCOUNTER — Other Ambulatory Visit: Payer: Self-pay

## 2018-11-28 DIAGNOSIS — R278 Other lack of coordination: Secondary | ICD-10-CM | POA: Diagnosis not present

## 2018-11-28 DIAGNOSIS — M6281 Muscle weakness (generalized): Secondary | ICD-10-CM | POA: Diagnosis not present

## 2018-11-28 DIAGNOSIS — R262 Difficulty in walking, not elsewhere classified: Secondary | ICD-10-CM | POA: Diagnosis not present

## 2018-11-28 DIAGNOSIS — R296 Repeated falls: Secondary | ICD-10-CM | POA: Diagnosis not present

## 2018-11-28 DIAGNOSIS — R29818 Other symptoms and signs involving the nervous system: Secondary | ICD-10-CM | POA: Diagnosis not present

## 2018-11-28 DIAGNOSIS — R29898 Other symptoms and signs involving the musculoskeletal system: Secondary | ICD-10-CM | POA: Diagnosis not present

## 2018-11-28 DIAGNOSIS — R293 Abnormal posture: Secondary | ICD-10-CM

## 2018-11-28 DIAGNOSIS — R2681 Unsteadiness on feet: Secondary | ICD-10-CM | POA: Diagnosis not present

## 2018-11-28 DIAGNOSIS — R2689 Other abnormalities of gait and mobility: Secondary | ICD-10-CM | POA: Diagnosis not present

## 2018-11-28 NOTE — Therapy (Signed)
[place] MAIN [facility] SERVICES [address] [place], [place], [address] Phone: [phone]   Fax:  [phone]  Physical Therapy Treatment Physical Therapy Progress Note   Dates of reporting period  10/23/18 to   11/28/18   Patient Details  Name: [name] MRN: [id number] Date of Birth: January 03, 1951 Referring Provider (PT): Gillermo Murdoch, NP   Encounter Date: 11/28/2018  PT End of Session - 11/28/18 1024    Visit Number  10    Number of Visits  16    Date for PT Re-Evaluation  12/18/18    Authorization Type  10/10 next session 1/10 with PN on 11/28/18    PT Start Time  0931    PT Stop Time  1015    PT Time Calculation (min)  44 min    Equipment Utilized During Treatment  Gait belt    Activity Tolerance  Patient tolerated treatment well;Patient limited by pain    Behavior During Therapy  WFL for tasks assessed/performed       Past Medical History:  Diagnosis Date  . Arthritis   . Bradycardia   . Cancer [facility]) 2013   skin cancer  . Depression    ptsd  . Dysrhythmia    chronic slow heart rate  . GERD (gastroesophageal reflux disease)   . Headache(784.0)    tension headaches non recent  . History of chicken pox   . History of kidney stones    passed  . Hypertension    treated with HCTZ  . Parkinson's disease ([place])    dx'ed 15 years ago  . PTSD (post-traumatic stress disorder)   . Shortness of breath dyspnea   . Sleep apnea    doesn't use C-pap  . Varicose veins     Past Surgical History:  Procedure Laterality Date  . CHOLECYSTECTOMY N/A 10/22/2014   Procedure: LAPAROSCOPIC CHOLECYSTECTOMY WITH INTRAOPERATIVE CHOLANGIOGRAM;  Surgeon: Dia Crawford III, MD;  Location: [facility] ORS;  Service: General;  Laterality: N/A;  . cyst removed      from lip as a child  . LUMBAR LAMINECTOMY/DECOMPRESSION MICRODISCECTOMY Bilateral 12/14/2012   Procedure: Bilateral lumbar three-four, four-five decompressive laminotomy/foraminotomy;  Surgeon:  Charlie Pitter, MD;  Location: [place] NEURO ORS;  Service: Neurosurgery;  Laterality: Bilateral;  . PULSE GENERATOR IMPLANT Bilateral 12/13/2013   Procedure: Bilateral implantable pulse generator placement;  Surgeon: Erline Levine, MD;  Location: [place] NEURO ORS;  Service: Neurosurgery;  Laterality: Bilateral;  Bilateral implantable pulse generator placement  . skin cancer removed     from ears,   12 lft arm  rt leg 15  . SUBTHALAMIC STIMULATOR BATTERY REPLACEMENT Bilateral 07/14/2017   Procedure: BILATERAL IMPLANTED PULSE GENERATOR CHANGE FOR DEEP BRAIN STIMULATOR;  Surgeon: Erline Levine, MD;  Location: [place];  Service: Neurosurgery;  Laterality: Bilateral;  . SUBTHALAMIC STIMULATOR INSERTION Bilateral 12/06/2013   Procedure: SUBTHALAMIC STIMULATOR INSERTION;  Surgeon: Erline Levine, MD;  Location: [place] NEURO ORS;  Service: Neurosurgery;  Laterality: Bilateral;  Bilateral deep brain stimulator placement    There were no vitals filed for this visit.  Subjective Assessment - 11/28/18 0934    Subjective  Patient reports no falls since last session. Woke up with L shoulder pain. Compliant with HEP.    Patient is accompained by:  Family member    Pertinent History  Patient is a pleasant 68 year old male who presents for for Parkinson's. Patient is awaiting disability hearing. He has an aide 14 hours/week and uses a  walker/u step at baseline. Patient has been seen for physical therapy at this clinic last year and was discharged to home health due to increasing fall risk and limited ability to negotiate home environment.  At time of discharge this physical therapist was advised to not encourage patient to ambulate and to use wheelchair by referring physician. Patient has since switched physicians to a neurosurgeon at [facility] who altered his deep brain stimulator with positive effects in January and re-adjusted it again about a month ago. Is now allowed to walk and encouraged to do so as frequently as possible. Patient  continues to have balance deficits having approximately 5-6 falls a month. PMH includes: Parkinson's, HTN, GERD, HLD, spinal stenosis, PTSD, Deep brain stimulator placement, cancer, and bradycardia.    Limitations  Walking;Standing;Sitting;House hold activities;Lifting    How long can you sit comfortably?  hours    How long can you stand comfortably?  3 minutes but varies    How long can you walk comfortably?  1-2 minutes    Patient Stated Goals  to stand and walk for longer distances for exercises.    Currently in Pain?  Yes    Pain Score  6     Pain Location  Shoulder    Pain Orientation  Left    Pain Descriptors / Indicators  Aching    Pain Type  Acute pain    Pain Onset  In the past 7 days    Pain Frequency  Intermittent          [facility] PT Assessment - 11/28/18 0001      Berg Balance Test   Sit to Stand  Able to stand without using hands and stabilize independently    Standing Unsupported  Able to stand 2 minutes with supervision    Sitting with Back Unsupported but Feet Supported on Floor or Stool  Able to sit safely and securely 2 minutes    Stand to Sit  Controls descent by using hands    Transfers  Able to transfer safely, definite need of hands    Standing Unsupported with Eyes Closed  Able to stand 10 seconds with supervision    Standing Unsupported with Feet Together  Able to place feet together independently and stand for 1 minute with supervision    From Standing, Reach Forward with Outstretched Arm  Can reach forward >5 cm safely (2")    From Standing Position, Pick up Object from [place] to pick up shoe, needs supervision    From Standing Position, Turn to Look Behind Over each Shoulder  Turn sideways only but maintains balance    Turn 360 Degrees  Able to turn 360 degrees safely but slowly    Standing Unsupported, Alternately Place Feet on Step/Stool  Able to complete 4 steps without aid or supervision    Standing Unsupported, One Foot in [place] to take small  step independently and hold 30 seconds    Standing on One Leg  Unable to try or needs assist to prevent fall    Total Score  36     Goals:  5x STS- 15 seconds no LOB, one hand on knee 10 MWT= 10 seconds: 1.0 m/s  BERG: 36/56  Falls: 4 falls /month   Treatment:   ambulate with head turns horizontally 2x 50 ft with narrowing of BOS with use of U walker but no LOB  Ambulate with head turns vertically 2x 50 ft; slight scissoring of LE's with use of  U walker, 1 near LOB  Standing marching without UE support. Challenge with single limb requirement x 2 LOB requiring Min A to retain COM  Standing hip extension 10x bent knee, 10x straight knee each LE BUE support. cueing for upright posture  Modified windmills 10x each side Seated position    Patient met 10 MWT goal, nearly doubling velocity of ambulation with U step. Coordination continues to be challenging with limited spatial awareness of LE's. When patient fatigues he experiences excessive knee flexion and forward trunk lean resulting in increased instability. Patient's condition has the potential to improve in response to therapy. Maximum improvement is yet to be obtained. The anticipated improvement is attainable and reasonable in a generally predictable time.     Patient's condition has the potential to improve in response to therapy. Maximum improvement is yet to be obtained. The anticipated improvement is attainable and reasonable in a generally predictable time.  Patient reports he is falling less often and is feeling stronger and more steady. Doesn't feel like he is fully back to himself but is getting better                  PT Education - 11/28/18 1023    Education provided  Yes    Education Details  goals, exercise technique, stability    Person(s) Educated  Patient    Methods  Explanation;Demonstration;Tactile cues;Verbal cues    Comprehension  Verbalized understanding;Returned demonstration;Verbal cues  required;Tactile cues required;Need further instruction       PT Short Term Goals - 11/21/18 0939      PT SHORT TERM GOAL #1   Title  Pt will perform HEP with family's supervision, for improved balance, transfers, and gait.      Baseline  7/7 HEP given; 8/5 pt reported that his is not performing his HEP consistently, still has his paper copy    Time  2    Period  Weeks    Status  On-going    Target Date  12/05/18        PT Long Term Goals - 11/28/18 0939      PT LONG TERM GOAL #1   Title  Patient (> 32 years old) will complete five times sit to stand test in < 15 seconds indicating an increased LE strength and improved balance.    Baseline  7/7: 20 seconds with UE support and one posterior LOB 8/12: 15 seconds one hand on knee    Time  8    Period  Weeks    Status  Partially Met    Target Date  12/18/18      PT LONG TERM GOAL #2   Title  Patient will increase 10 meter walk test to >1.1ms as to improve gait speed for better community ambulation and to reduce fall risk.    Baseline  7/7: .58 m/s with U step walker 8/12: 1.0 m/s    Time  8    Period  Weeks    Status  Achieved      PT LONG TERM GOAL #3   Title  Patient will increase Berg Balance score by > 6 points (31/56) to demonstrate decreased fall risk during functional activities.    Baseline  7/7: 25/56 8/12/: 36/56    Time  8    Period  Weeks    Status  Revised    Target Date  12/18/18      PT LONG TERM GOAL #4   Title  Patient will  deny any falls over past 4 weeks to demonstrate improved safety awareness at home and in the community.    Baseline  7/7: averages 6 falls/month 8/12: 4 falls in past month    Time  8    Period  Weeks    Status  Partially Met    Target Date  12/18/18              Patient will benefit from skilled therapeutic intervention in order to improve the following deficits and impairments:     Visit Diagnosis: 1. Other abnormalities of gait and mobility   2. Unsteadiness on feet    3. Difficulty walking   4. Abnormal posture        Problem List Patient Active Problem List   Diagnosis Date Noted  . Rib pain on right side 09/25/2018  . Skin sore 12/01/2017  . Hematoma 10/18/2017  . Accidental fall 10/18/2017  . 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 Radicular pain in right arm 10/09/2016  . GERD (gastroesophageal reflux disease) 07/31/2016  . Trochanteric bursitis 03/03/2016  . Left hand pain 10/30/2015  . Fracture, finger, multiple sites 10/30/2015  . [place] cancer screening 07/23/2015  . Healthcare maintenance 07/23/2015  . Gout 02/18/2015  . Depression 01/06/2015  . Irritation of eyelid 08/20/2014  . Dupuytren's contracture 08/20/2014  . Cough 07/21/2014  . Lumbar stenosis with neurogenic claudication 06/13/2014  . Preop cardiovascular exam 05/28/2014  . S/P deep brain stimulator placement 05/08/2014  . Joint pain 01/22/2014  . Medicare annual wellness visit, initial 01/19/2014  . Advance care planning 01/19/2014  . Parkinson's disease ([place]) 12/13/2013  . PTSD (post-traumatic stress disorder) 06/13/2013  . Erectile dysfunction 06/13/2013  . HLD (hyperlipidemia) 06/13/2013  . Hip pain 06/10/2013  . Pain in joint, shoulder region 06/10/2013  . Right leg swelling 06/03/2013  . Essential hypertension 06/03/2013  . Bradycardia by electrocardiogram 06/03/2013  . Obstructive sleep apnea 03/12/2013  . Spinal stenosis, lumbar region, with neurogenic claudication 12/14/2012   Janna Arch, PT, DPT   11/28/2018, 10:29 AM  [place] MAIN [facility] SERVICES [address] [place], [place], [address] Phone: [phone]   Fax:  [phone]  Name: [name] MRN: [id number] Date of Birth: 1950-06-18

## 2018-11-30 DIAGNOSIS — R471 Dysarthria and anarthria: Secondary | ICD-10-CM | POA: Diagnosis not present

## 2018-11-30 DIAGNOSIS — R6882 Decreased libido: Secondary | ICD-10-CM | POA: Diagnosis not present

## 2018-11-30 DIAGNOSIS — G20 Parkinson's disease: Secondary | ICD-10-CM | POA: Diagnosis not present

## 2018-11-30 DIAGNOSIS — G4733 Obstructive sleep apnea (adult) (pediatric): Secondary | ICD-10-CM | POA: Diagnosis not present

## 2018-11-30 DIAGNOSIS — F3289 Other specified depressive episodes: Secondary | ICD-10-CM | POA: Diagnosis not present

## 2018-11-30 DIAGNOSIS — Z9689 Presence of other specified functional implants: Secondary | ICD-10-CM | POA: Diagnosis not present

## 2018-11-30 DIAGNOSIS — R2689 Other abnormalities of gait and mobility: Secondary | ICD-10-CM | POA: Diagnosis not present

## 2018-11-30 DIAGNOSIS — G4752 REM sleep behavior disorder: Secondary | ICD-10-CM | POA: Diagnosis not present

## 2018-12-03 ENCOUNTER — Other Ambulatory Visit: Payer: Self-pay

## 2018-12-03 ENCOUNTER — Ambulatory Visit: Payer: Medicare PPO

## 2018-12-03 DIAGNOSIS — R2681 Unsteadiness on feet: Secondary | ICD-10-CM | POA: Diagnosis not present

## 2018-12-03 DIAGNOSIS — R262 Difficulty in walking, not elsewhere classified: Secondary | ICD-10-CM | POA: Diagnosis not present

## 2018-12-03 DIAGNOSIS — M6281 Muscle weakness (generalized): Secondary | ICD-10-CM | POA: Diagnosis not present

## 2018-12-03 DIAGNOSIS — R296 Repeated falls: Secondary | ICD-10-CM | POA: Diagnosis not present

## 2018-12-03 DIAGNOSIS — R2689 Other abnormalities of gait and mobility: Secondary | ICD-10-CM

## 2018-12-03 DIAGNOSIS — R29898 Other symptoms and signs involving the musculoskeletal system: Secondary | ICD-10-CM | POA: Diagnosis not present

## 2018-12-03 DIAGNOSIS — R29818 Other symptoms and signs involving the nervous system: Secondary | ICD-10-CM | POA: Diagnosis not present

## 2018-12-03 DIAGNOSIS — R278 Other lack of coordination: Secondary | ICD-10-CM | POA: Diagnosis not present

## 2018-12-03 DIAGNOSIS — R293 Abnormal posture: Secondary | ICD-10-CM | POA: Diagnosis not present

## 2018-12-03 NOTE — Therapy (Signed)
[place] MAIN [facility] SERVICES [address] [place], [place], [address] Phone: [phone]   Fax:  [phone]  Physical Therapy Treatment  Patient Details  Name: [name] MRN: [id number] Date of Birth: [birth year] Referring Provider (PT): Gillermo Murdoch, NP   Encounter Date: 12/03/2018  PT End of Session - 12/03/18 0958    Visit Number  11    Number of Visits  16    Date for PT Re-Evaluation  12/18/18    Authorization Type  1/10 with PN on 11/28/18    PT Start Time  0929    PT Stop Time  1015    PT Time Calculation (min)  46 min    Equipment Utilized During Treatment  Gait belt    Activity Tolerance  Patient tolerated treatment well;Patient limited by pain    Behavior During Therapy  [facility] for tasks assessed/performed       Past Medical History:  Diagnosis Date  . Arthritis   . Bradycardia   . Cancer [facility]) 2013   skin cancer  . Depression    ptsd  . Dysrhythmia    chronic slow heart rate  . GERD (gastroesophageal reflux disease)   . Headache(784.0)    tension headaches non recent  . History of chicken pox   . History of kidney stones    passed  . Hypertension    treated with HCTZ  . Parkinson's disease ([place])    dx'ed 15 years ago  . PTSD (post-traumatic stress disorder)   . Shortness of breath dyspnea   . Sleep apnea    doesn't use C-pap  . Varicose veins     Past Surgical History:  Procedure Laterality Date  . CHOLECYSTECTOMY N/A 10/22/2014   Procedure: LAPAROSCOPIC CHOLECYSTECTOMY WITH INTRAOPERATIVE CHOLANGIOGRAM;  Surgeon: Dia Crawford III, MD;  Location: [facility] ORS;  Service: General;  Laterality: N/A;  . cyst removed      from lip as a child  . LUMBAR LAMINECTOMY/DECOMPRESSION MICRODISCECTOMY Bilateral 12/14/2012   Procedure: Bilateral lumbar three-four, four-five decompressive laminotomy/foraminotomy;  Surgeon: Charlie Pitter, MD;  Location: [place] NEURO ORS;  Service: Neurosurgery;  Laterality: Bilateral;  . PULSE  GENERATOR IMPLANT Bilateral 12/13/2013   Procedure: Bilateral implantable pulse generator placement;  Surgeon: Erline Levine, MD;  Location: [place] NEURO ORS;  Service: Neurosurgery;  Laterality: Bilateral;  Bilateral implantable pulse generator placement  . skin cancer removed     from ears,   12 lft arm  rt leg 15  . SUBTHALAMIC STIMULATOR BATTERY REPLACEMENT Bilateral 07/14/2017   Procedure: BILATERAL IMPLANTED PULSE GENERATOR CHANGE FOR DEEP BRAIN STIMULATOR;  Surgeon: Erline Levine, MD;  Location: [place];  Service: Neurosurgery;  Laterality: Bilateral;  . SUBTHALAMIC STIMULATOR INSERTION Bilateral 12/06/2013   Procedure: SUBTHALAMIC STIMULATOR INSERTION;  Surgeon: Erline Levine, MD;  Location: [place] NEURO ORS;  Service: Neurosurgery;  Laterality: Bilateral;  Bilateral deep brain stimulator placement    There were no vitals filed for this visit.  Subjective Assessment - 12/03/18 0934    Subjective  Patient reports he is having a hard time with videos for HEP. No falls since last session.    Patient is accompained by:  Family member    Pertinent History  Patient is a pleasant 68 year old male who presents for for Parkinson's. Patient is awaiting disability hearing. He has an aide 14 hours/week and uses a walker/u step at baseline. Patient has been seen for physical therapy at this clinic last year and was discharged  to home health due to increasing fall risk and limited ability to negotiate home environment.  At time of discharge this physical therapist was advised to not encourage patient to ambulate and to use wheelchair by referring physician. Patient has since switched physicians to a neurosurgeon at [facility] who altered his deep brain stimulator with positive effects in January and re-adjusted it again about a month ago. Is now allowed to walk and encouraged to do so as frequently as possible. Patient continues to have balance deficits having approximately 5-6 falls a month. PMH includes: Parkinson's, HTN,  GERD, HLD, spinal stenosis, PTSD, Deep brain stimulator placement, cancer, and bradycardia.    Limitations  Walking;Standing;Sitting;House hold activities;Lifting    How long can you sit comfortably?  hours    How long can you stand comfortably?  3 minutes but varies    How long can you walk comfortably?  1-2 minutes    Patient Stated Goals  to stand and walk for longer distances for exercises.    Currently in Pain?  No/denies       Treatment:       -Nustep L4, 4 minutes RPM> 60 for cardiovascular challenge/rhythmic reciprocating pattern of muscle recruitment.    TherEx:  -Quantum leg press # 165; 12x ; 3 sets second set #195 (did two reps of 180 and was too easy)  ; 3rd set 200 lb cueing for sequencing and slowing velocity for maximal muscle recruitment   -Quantum leg press, single limb #105 12x and then 10x second set each limb; 2 sets using for sequencing and slow velocity for maximal muscle recruitment  Ambulate around cone 12 ft away from patient, cueing for keeping inside walker, slight knee flexion with performance x 4. Cueing for keeping walker in front during turn to sit down.   Progress from above mentioned intervention to negotiating weaving around two cones for figure 8 pattern keeping feet inside walker     Ambulate with head turns in hallway 86 ft x 2 with CGA; increased knee flexion with fatigue as patient ambulated longer duration.      Pt educated throughout session about proper posture and technique with exercises. Improved exercise technique, movement at target joints, use of target muscles after min to mod verbal, visual, tactile cues.                       PT Education - 12/03/18 0957    Education provided  Yes    Education Details  exercise technique, stability    Person(s) Educated  Patient    Methods  Explanation;Demonstration;Tactile cues;Verbal cues    Comprehension  Verbalized understanding;Returned demonstration;Verbal cues  required;Tactile cues required;Need further instruction       PT Short Term Goals - 11/21/18 0939      PT SHORT TERM GOAL #1   Title  Pt will perform HEP with family's supervision, for improved balance, transfers, and gait.      Baseline  7/7 HEP given; 8/5 pt reported that his is not performing his HEP consistently, still has his paper copy    Time  2    Period  Weeks    Status  On-going    Target Date  12/05/18        PT Long Term Goals - 11/28/18 0939      PT LONG TERM GOAL #1   Title  Patient (> 24 years old) will complete five times sit to stand test in < 15 seconds indicating  an increased LE strength and improved balance.    Baseline  7/7: 20 seconds with UE support and one posterior LOB 8/12: 15 seconds one hand on knee    Time  8    Period  Weeks    Status  Partially Met    Target Date  12/18/18      PT LONG TERM GOAL #2   Title  Patient will increase 10 meter walk test to >1.23ms as to improve gait speed for better community ambulation and to reduce fall risk.    Baseline  7/7: .58 m/s with U step walker 8/12: 1.0 m/s    Time  8    Period  Weeks    Status  Achieved      PT LONG TERM GOAL #3   Title  Patient will increase Berg Balance score by > 6 points (31/56) to demonstrate decreased fall risk during functional activities.    Baseline  7/7: 25/56 8/12/: 36/56    Time  8    Period  Weeks    Status  Revised    Target Date  12/18/18      PT LONG TERM GOAL #4   Title  Patient will deny any falls over past 4 weeks to demonstrate improved safety awareness at home and in the community.    Baseline  7/7: averages 6 falls/month 8/12: 4 falls in past month    Time  8    Period  Weeks    Status  Partially Met    Target Date  12/18/18            Plan - 12/03/18 1001    Clinical Impression Statement  Patient is progressing with functional strength and ambulatory mechanics. He is able to tolerate increased weights for strengthening and increased challenges to  dynamic stability utilizing U step walker. Patient fatigues with prolonged ambulation with increased knee flexion with weightbearing. Patient will benefit from skilled physical therapy to increase stability, strength, mobility, and posture to decrease falls risk and improve quality of life    Personal Factors and Comorbidities  Age;Comorbidity 3+;Past/Current Experience;Time since onset of injury/illness/exacerbation    Comorbidities  athritis, bradycardia, cancer, depression, dysrhythmia, GERD, HTN, Parkinson's, PTSD, SOB, sleep apnea    Examination-Activity Limitations  Bathing;Bed Mobility;Caring for Others;Bend;Dressing;Lift;Locomotion Level;Reach Overhead;Sit;Squat;Stairs;Stand;Toileting;Transfers    Examination-Participation Restrictions  Church;Cleaning;Community Activity;Interpersonal Relationship;Shop;Volunteer;Yard Work;Other    Stability/Clinical Decision Making  Unstable/Unpredictable    Rehab Potential  Fair    PT Frequency  2x / week    PT Duration  8 weeks    PT Treatment/Interventions  ADLs/Self Care Home Management;Aquatic Therapy;Cryotherapy;Electrical Stimulation;Iontophoresis 455mml Dexamethasone;Moist Heat;Ultrasound;Contrast Bath;DME Instruction;Gait training;Stair training;Functional mobility training;Therapeutic activities;Therapeutic exercise;Balance training;Neuromuscular re-education;Patient/family education;Manual techniques;Wheelchair mobility training;Energy conservation;Passive range of motion;Traction;Orthotic Fit/Training    PT Next Visit Plan  goals    PT Home Exercise Plan  see above    Consulted and Agree with Plan of Care  Patient;Family member/caregiver    Family Member Consulted  wife       Patient will benefit from skilled therapeutic intervention in order to improve the following deficits and impairments:  Abnormal gait, Decreased balance, Decreased coordination, Decreased mobility, Impaired tone, Postural dysfunction, Decreased strength, Decreased safety  awareness, Improper body mechanics, Impaired flexibility, Decreased activity tolerance, Decreased endurance, Decreased knowledge of precautions, Difficulty walking, Pain  Visit Diagnosis: 1. Other abnormalities of gait and mobility   2. Unsteadiness on feet        Problem List Patient Active Problem List  Diagnosis Date Noted  . Rib pain on right side 09/25/2018  . Skin sore 12/01/2017  . Hematoma 10/18/2017  . Accidental fall 10/18/2017  . 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 Radicular pain in right arm 10/09/2016  . GERD (gastroesophageal reflux disease) 07/31/2016  . Trochanteric bursitis 03/03/2016  . Left hand pain 10/30/2015  . Fracture, finger, multiple sites 10/30/2015  . Colon cancer screening 07/23/2015  . Healthcare maintenance 07/23/2015  . Gout 02/18/2015  . Depression 01/06/2015  . Irritation of eyelid 08/20/2014  . Dupuytren's contracture 08/20/2014  . Cough 07/21/2014  . Lumbar stenosis with neurogenic claudication 06/13/2014  . Preop cardiovascular exam 05/28/2014  . S/P deep brain stimulator placement 05/08/2014  . Joint pain 01/22/2014  . Medicare annual wellness visit, initial 01/19/2014  . Advance care planning 01/19/2014  . Parkinson's disease ([place]) 12/13/2013  . PTSD (post-traumatic stress disorder) 06/13/2013  . Erectile dysfunction 06/13/2013  . HLD (hyperlipidemia) 06/13/2013  . Hip pain 06/10/2013  . Pain in joint, shoulder region 06/10/2013  . Right leg swelling 06/03/2013  . Essential hypertension 06/03/2013  . Bradycardia by electrocardiogram 06/03/2013  . Obstructive sleep apnea 03/12/2013  . Spinal stenosis, lumbar region, with neurogenic claudication 12/14/2012   Janna Arch, PT, DPT   12/03/2018, 10:17 AM  [place] MAIN [facility] SERVICES [address] [place], [place], [address] Phone: [phone]   Fax:  [phone]  Name: WARNER LADUCA MRN: [id number] Date of Birth: Nov 05, 1950

## 2018-12-05 ENCOUNTER — Other Ambulatory Visit: Payer: Self-pay

## 2018-12-05 ENCOUNTER — Ambulatory Visit: Payer: Medicare PPO

## 2018-12-05 DIAGNOSIS — R2681 Unsteadiness on feet: Secondary | ICD-10-CM

## 2018-12-05 DIAGNOSIS — R262 Difficulty in walking, not elsewhere classified: Secondary | ICD-10-CM

## 2018-12-05 DIAGNOSIS — R29898 Other symptoms and signs involving the musculoskeletal system: Secondary | ICD-10-CM | POA: Diagnosis not present

## 2018-12-05 DIAGNOSIS — R2689 Other abnormalities of gait and mobility: Secondary | ICD-10-CM | POA: Diagnosis not present

## 2018-12-05 DIAGNOSIS — R29818 Other symptoms and signs involving the nervous system: Secondary | ICD-10-CM | POA: Diagnosis not present

## 2018-12-05 DIAGNOSIS — R293 Abnormal posture: Secondary | ICD-10-CM

## 2018-12-05 DIAGNOSIS — M6281 Muscle weakness (generalized): Secondary | ICD-10-CM | POA: Diagnosis not present

## 2018-12-05 DIAGNOSIS — R296 Repeated falls: Secondary | ICD-10-CM | POA: Diagnosis not present

## 2018-12-05 DIAGNOSIS — R278 Other lack of coordination: Secondary | ICD-10-CM | POA: Diagnosis not present

## 2018-12-05 NOTE — Therapy (Signed)
[place] MAIN [facility] SERVICES [address] [place], [place], [address] Phone: [phone]   Fax:  [phone]  Physical Therapy Treatment  Patient Details  Name: [name] MRN: [id number] Date of Birth: 07-21-1950 Referring Provider (PT): Gillermo Murdoch, NP   Encounter Date: 12/05/2018  PT End of Session - 12/05/18 1244    Visit Number  12    Number of Visits  16    Date for PT Re-Evaluation  12/18/18    Authorization Type  2/10 with PN on 11/28/18    PT Start Time  0930    PT Stop Time  1014    PT Time Calculation (min)  44 min    Equipment Utilized During Treatment  Gait belt    Activity Tolerance  Patient tolerated treatment well;Patient limited by pain    Behavior During Therapy  [facility] for tasks assessed/performed       Past Medical History:  Diagnosis Date  . Arthritis   . Bradycardia   . Cancer [facility]) 2013   skin cancer  . Depression    ptsd  . Dysrhythmia    chronic slow heart rate  . GERD (gastroesophageal reflux disease)   . Headache(784.0)    tension headaches non recent  . History of chicken pox   . History of kidney stones    passed  . Hypertension    treated with HCTZ  . Parkinson's disease ([place])    dx'ed 15 years ago  . PTSD (post-traumatic stress disorder)   . Shortness of breath dyspnea   . Sleep apnea    doesn't use C-pap  . Varicose veins     Past Surgical History:  Procedure Laterality Date  . CHOLECYSTECTOMY N/A 10/22/2014   Procedure: LAPAROSCOPIC CHOLECYSTECTOMY WITH INTRAOPERATIVE CHOLANGIOGRAM;  Surgeon: Dia Crawford III, MD;  Location: [facility] ORS;  Service: General;  Laterality: N/A;  . cyst removed      from lip as a child  . LUMBAR LAMINECTOMY/DECOMPRESSION MICRODISCECTOMY Bilateral 12/14/2012   Procedure: Bilateral lumbar three-four, four-five decompressive laminotomy/foraminotomy;  Surgeon: Charlie Pitter, MD;  Location: [place] NEURO ORS;  Service: Neurosurgery;  Laterality: Bilateral;  . PULSE  GENERATOR IMPLANT Bilateral 12/13/2013   Procedure: Bilateral implantable pulse generator placement;  Surgeon: Erline Levine, MD;  Location: [place] NEURO ORS;  Service: Neurosurgery;  Laterality: Bilateral;  Bilateral implantable pulse generator placement  . skin cancer removed     from ears,   12 lft arm  rt leg 15  . SUBTHALAMIC STIMULATOR BATTERY REPLACEMENT Bilateral 07/14/2017   Procedure: BILATERAL IMPLANTED PULSE GENERATOR CHANGE FOR DEEP BRAIN STIMULATOR;  Surgeon: Erline Levine, MD;  Location: [place];  Service: Neurosurgery;  Laterality: Bilateral;  . SUBTHALAMIC STIMULATOR INSERTION Bilateral 12/06/2013   Procedure: SUBTHALAMIC STIMULATOR INSERTION;  Surgeon: Erline Levine, MD;  Location: [place] NEURO ORS;  Service: Neurosurgery;  Laterality: Bilateral;  Bilateral deep brain stimulator placement    There were no vitals filed for this visit.  Subjective Assessment - 12/05/18 0951    Subjective  Patient has not had his morning coffee and just had his morning medications. No falls since last session.    Patient is accompained by:  Family member    Pertinent History  Patient is a pleasant 68 year old male who presents for for Parkinson's. Patient is awaiting disability hearing. He has an aide 14 hours/week and uses a walker/u step at baseline. Patient has been seen for physical therapy at this clinic last year and was  discharged to home health due to increasing fall risk and limited ability to negotiate home environment.  At time of discharge this physical therapist was advised to not encourage patient to ambulate and to use wheelchair by referring physician. Patient has since switched physicians to a neurosurgeon at [facility] who altered his deep brain stimulator with positive effects in January and re-adjusted it again about a month ago. Is now allowed to walk and encouraged to do so as frequently as possible. Patient continues to have balance deficits having approximately 5-6 falls a month. PMH includes:  Parkinson's, HTN, GERD, HLD, spinal stenosis, PTSD, Deep brain stimulator placement, cancer, and bradycardia.    Limitations  Walking;Standing;Sitting;House hold activities;Lifting    How long can you sit comfortably?  hours    How long can you stand comfortably?  3 minutes but varies    How long can you walk comfortably?  1-2 minutes    Patient Stated Goals  to stand and walk for longer distances for exercises.    Currently in Pain?  No/denies        Patient has not had his morning coffee and just had his morning medications.    TherEx:   Seated -[place]: modified seated: 10x each side, focus on upright posture between each diagonal reach.  -Out up down up : hands on knees, hands out wide into abduction, raise hands overhead, reach forward down to ground, back up to sky then back out to abduction and back to knees x 10 L-ateral shift with arm swing for seated warrior pose x10 each side   -Quantum leg press #  200 lb x15, second set #215 12x  cueing for sequencing and slowing velocity for maximal muscle recruitment   -Quantum leg press, single limb #105 12x and then 10x second set each limb; 2 sets using for sequencing and slow velocity for maximal muscle recruitment   modified squat with chair behind patient, PVC pipe behind head; unable to obtain shoulder position ; utilized 7lb dumbells with airex pad in seat: 15x pause squats.    UE and back stretch with PVC pipe and PT overpressure x 3 minutes      Pt educated throughout session about proper posture and technique with exercises. Improved exercise technique, movement at target joints, use of target muscles after min to mod verbal, visual, tactile cues.             PT Education - 12/05/18 0955    Education provided  Yes    Education Details  exercise tecnique, stability    Person(s) Educated  Patient    Methods  Explanation;Demonstration;Tactile cues;Verbal cues    Comprehension  Verbalized understanding;Returned  demonstration;Verbal cues required;Tactile cues required;Need further instruction       PT Short Term Goals - 11/21/18 0939      PT SHORT TERM GOAL #1   Title  Pt will perform HEP with family's supervision, for improved balance, transfers, and gait.      Baseline  7/7 HEP given; 8/5 pt reported that his is not performing his HEP consistently, still has his paper copy    Time  2    Period  Weeks    Status  On-going    Target Date  12/05/18        PT Long Term Goals - 11/28/18 0939      PT LONG TERM GOAL #1   Title  Patient (> 84 years old) will complete five times sit to stand test in <  15 seconds indicating an increased LE strength and improved balance.    Baseline  7/7: 20 seconds with UE support and one posterior LOB 8/12: 15 seconds one hand on knee    Time  8    Period  Weeks    Status  Partially Met    Target Date  12/18/18      PT LONG TERM GOAL #2   Title  Patient will increase 10 meter walk test to >1.12ms as to improve gait speed for better community ambulation and to reduce fall risk.    Baseline  7/7: .58 m/s with U step walker 8/12: 1.0 m/s    Time  8    Period  Weeks    Status  Achieved      PT LONG TERM GOAL #3   Title  Patient will increase Berg Balance score by > 6 points (31/56) to demonstrate decreased fall risk during functional activities.    Baseline  7/7: 25/56 8/12/: 36/56    Time  8    Period  Weeks    Status  Revised    Target Date  12/18/18      PT LONG TERM GOAL #4   Title  Patient will deny any falls over past 4 weeks to demonstrate improved safety awareness at home and in the community.    Baseline  7/7: averages 6 falls/month 8/12: 4 falls in past month    Time  8    Period  Weeks    Status  Partially Met    Target Date  12/18/18            Plan - 12/05/18 1248    Clinical Impression Statement  Patient initially sleepy upon session start, however quickly woke up and became motivated as session progressed. Patient's strength is  improving with limited muscle tremors and improved smoothness of coordination/sequencing of muscle activation when performing heavier lifting. Patient introduced to squats performing them with max cueing. Patient will benefit from skilled physical therapy to increase stability, strength, mobility, and posture to decrease falls risk and improve quality of life    Personal Factors and Comorbidities  Age;Comorbidity 3+;Past/Current Experience;Time since onset of injury/illness/exacerbation    Comorbidities  athritis, bradycardia, cancer, depression, dysrhythmia, GERD, HTN, Parkinson's, PTSD, SOB, sleep apnea    Examination-Activity Limitations  Bathing;Bed Mobility;Caring for Others;Bend;Dressing;Lift;Locomotion Level;Reach Overhead;Sit;Squat;Stairs;Stand;Toileting;Transfers    Examination-Participation Restrictions  Church;Cleaning;Community Activity;Interpersonal Relationship;Shop;Volunteer;Yard Work;Other    Stability/Clinical Decision Making  Unstable/Unpredictable    Rehab Potential  Fair    PT Frequency  2x / week    PT Duration  8 weeks    PT Treatment/Interventions  ADLs/Self Care Home Management;Aquatic Therapy;Cryotherapy;Electrical Stimulation;Iontophoresis '4mg'$ /ml Dexamethasone;Moist Heat;Ultrasound;Contrast Bath;DME Instruction;Gait training;Stair training;Functional mobility training;Therapeutic activities;Therapeutic exercise;Balance training;Neuromuscular re-education;Patient/family education;Manual techniques;Wheelchair mobility training;Energy conservation;Passive range of motion;Traction;Orthotic Fit/Training    PT Next Visit Plan  goals    PT Home Exercise Plan  see above    Consulted and Agree with Plan of Care  Patient;Family member/caregiver    Family Member Consulted  wife       Patient will benefit from skilled therapeutic intervention in order to improve the following deficits and impairments:  Abnormal gait, Decreased balance, Decreased coordination, Decreased mobility,  Impaired tone, Postural dysfunction, Decreased strength, Decreased safety awareness, Improper body mechanics, Impaired flexibility, Decreased activity tolerance, Decreased endurance, Decreased knowledge of precautions, Difficulty walking, Pain  Visit Diagnosis: 1. Other abnormalities of gait and mobility   2. Unsteadiness on feet   3. Difficulty walking  4. Abnormal posture        Problem List Patient Active Problem List   Diagnosis Date Noted  . Rib pain on right side 09/25/2018  . Skin sore 12/01/2017  . Hematoma 10/18/2017  . Accidental fall 10/18/2017  . 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 Radicular pain in right arm 10/09/2016  . GERD (gastroesophageal reflux disease) 07/31/2016  . Trochanteric bursitis 03/03/2016  . Left hand pain 10/30/2015  . Fracture, finger, multiple sites 10/30/2015  . Colon cancer screening 07/23/2015  . Healthcare maintenance 07/23/2015  . Gout 02/18/2015  . Depression 01/06/2015  . Irritation of eyelid 08/20/2014  . Dupuytren's contracture 08/20/2014  . Cough 07/21/2014  . Lumbar stenosis with neurogenic claudication 06/13/2014  . Preop cardiovascular exam 05/28/2014  . S/P deep brain stimulator placement 05/08/2014  . Joint pain 01/22/2014  . Medicare annual wellness visit, initial 01/19/2014  . Advance care planning 01/19/2014  . Parkinson's disease ([place]) 12/13/2013  . PTSD (post-traumatic stress disorder) 06/13/2013  . Erectile dysfunction 06/13/2013  . HLD (hyperlipidemia) 06/13/2013  . Hip pain 06/10/2013  . Pain in joint, shoulder region 06/10/2013  . Right leg swelling 06/03/2013  . Essential hypertension 06/03/2013  . Bradycardia by electrocardiogram 06/03/2013  . Obstructive sleep apnea 03/12/2013  . Spinal stenosis, lumbar region, with neurogenic claudication 12/14/2012   Janna Arch, PT, DPT   12/05/2018, 12:49 PM  [place] MAIN [facility] SERVICES [address] [place], [place],  [address] Phone: [phone]   Fax:  [phone]  Name: [name] MRN: [id number] Date of Birth: Dec 10, 1950

## 2018-12-10 ENCOUNTER — Ambulatory Visit: Payer: Medicare PPO

## 2018-12-10 ENCOUNTER — Ambulatory Visit (INDEPENDENT_AMBULATORY_CARE_PROVIDER_SITE_OTHER): Payer: Medicare PPO | Admitting: Psychology

## 2018-12-10 DIAGNOSIS — F4323 Adjustment disorder with mixed anxiety and depressed mood: Secondary | ICD-10-CM

## 2018-12-10 DIAGNOSIS — F331 Major depressive disorder, recurrent, moderate: Secondary | ICD-10-CM

## 2018-12-12 ENCOUNTER — Ambulatory Visit: Payer: Medicare PPO

## 2018-12-12 ENCOUNTER — Other Ambulatory Visit: Payer: Self-pay

## 2018-12-12 DIAGNOSIS — R293 Abnormal posture: Secondary | ICD-10-CM | POA: Diagnosis not present

## 2018-12-12 DIAGNOSIS — R262 Difficulty in walking, not elsewhere classified: Secondary | ICD-10-CM | POA: Diagnosis not present

## 2018-12-12 DIAGNOSIS — R29898 Other symptoms and signs involving the musculoskeletal system: Secondary | ICD-10-CM | POA: Diagnosis not present

## 2018-12-12 DIAGNOSIS — R2681 Unsteadiness on feet: Secondary | ICD-10-CM | POA: Diagnosis not present

## 2018-12-12 DIAGNOSIS — R2689 Other abnormalities of gait and mobility: Secondary | ICD-10-CM

## 2018-12-12 DIAGNOSIS — R278 Other lack of coordination: Secondary | ICD-10-CM | POA: Diagnosis not present

## 2018-12-12 DIAGNOSIS — M6281 Muscle weakness (generalized): Secondary | ICD-10-CM | POA: Diagnosis not present

## 2018-12-12 DIAGNOSIS — R29818 Other symptoms and signs involving the nervous system: Secondary | ICD-10-CM | POA: Diagnosis not present

## 2018-12-12 DIAGNOSIS — R296 Repeated falls: Secondary | ICD-10-CM | POA: Diagnosis not present

## 2018-12-12 NOTE — Therapy (Signed)
[place] MA[facility] SERVICES [address] [place], [place], [address] Phone: [phone]   Fax:  [phone]  Physical Therapy Treatment  Patient Details  Name: [name] MRN: [id number] Date of Birth: 1950/05/28 Referring Provider (PT): Gillermo Murdoch, NP   Encounter Date: 12/12/2018  PT End of Session - 12/12/18 0948    Visit Number  13    Number of Visits  16    Date for PT Re-Evaluation  12/18/18    Authorization Type  3/10 with PN on 11/28/18    PT Start Time  0926    PT Stop Time  1010    PT Time Calculation (min)  44 min    Equipment Utilized During Treatment  Gait belt    Activity Tolerance  Patient tolerated treatment well;Patient limited by pain    Behavior During Therapy  [facility] for tasks assessed/performed       Past Medical History:  Diagnosis Date  . Arthritis   . Bradycardia   . Cancer [facility]) 2013   skin cancer  . Depression    ptsd  . Dysrhythmia    chronic slow heart rate  . GERD (gastroesophageal reflux disease)   . Headache(784.0)    tension headaches non recent  . History of chicken pox   . History of kidney stones    passed  . Hypertension    treated with HCTZ  . Parkinson's disease ([place])    dx'ed 15 years ago  . PTSD (post-traumatic stress disorder)   . Shortness of breath dyspnea   . Sleep apnea    doesn't use C-pap  . Varicose veins     Past Surgical History:  Procedure Laterality Date  . CHOLECYSTECTOMY N/A 10/22/2014   Procedure: LAPAROSCOPIC CHOLECYSTECTOMY WITH INTRAOPERATIVE CHOLANGIOGRAM;  Surgeon: Dia Crawford III, MD;  Location: [facility] ORS;  Service: General;  Laterality: N/A;  . cyst removed      from lip as a child  . LUMBAR LAMINECTOMY/DECOMPRESSION MICRODISCECTOMY Bilateral 12/14/2012   Procedure: Bilateral lumbar three-four, four-five decompressive laminotomy/foraminotomy;  Surgeon: Charlie Pitter, MD;  Location: [place] NEURO ORS;  Service: Neurosurgery;  Laterality: Bilateral;  . PULSE  GENERATOR IMPLANT Bilateral 12/13/2013   Procedure: Bilateral implantable pulse generator placement;  Surgeon: Erline Levine, MD;  Location: [place] NEURO ORS;  Service: Neurosurgery;  Laterality: Bilateral;  Bilateral implantable pulse generator placement  . skin cancer removed     from ears,   12 lft arm  rt leg 15  . SUBTHALAMIC STIMULATOR BATTERY REPLACEMENT Bilateral 07/14/2017   Procedure: BILATERAL IMPLANTED PULSE GENERATOR CHANGE FOR DEEP BRAIN STIMULATOR;  Surgeon: Erline Levine, MD;  Location: [place];  Service: Neurosurgery;  Laterality: Bilateral;  . SUBTHALAMIC STIMULATOR INSERTION Bilateral 12/06/2013   Procedure: SUBTHALAMIC STIMULATOR INSERTION;  Surgeon: Erline Levine, MD;  Location: [place] NEURO ORS;  Service: Neurosurgery;  Laterality: Bilateral;  Bilateral deep brain stimulator placement    There were no vitals filed for this visit.  Subjective Assessment - 12/12/18 0936    Subjective  Patient and patient's wife reports no falls since last session. Was overheated yesterday so still a little shaky today.    Patient is accompained by:  Family member    Pertinent History  Patient is a pleasant 68 year old male who presents for for Parkinson's. Patient is awaiting disability hearing. He has an aide 14 hours/week and uses a walker/u step at baseline. Patient has been seen for physical therapy at this clinic last year and  was discharged to home health due to increasing fall risk and limited ability to negotiate home environment.  At time of discharge this physical therapist was advised to not encourage patient to ambulate and to use wheelchair by referring physician. Patient has since switched physicians to a neurosurgeon at [facility] who altered his deep brain stimulator with positive effects in January and re-adjusted it again about a month ago. Is now allowed to walk and encouraged to do so as frequently as possible. Patient continues to have balance deficits having approximately 5-6 falls a month. PMH  includes: Parkinson's, HTN, GERD, HLD, spinal stenosis, PTSD, Deep brain stimulator placement, cancer, and bradycardia.    Limitations  Walking;Standing;Sitting;House hold activities;Lifting    How long can you sit comfortably?  hours    How long can you stand comfortably?  3 minutes but varies    How long can you walk comfortably?  1-2 minutes    Patient Stated Goals  to stand and walk for longer distances for exercises.    Currently in Pain?  No/denies       Treatment:   Seated -Out up down up : hands on knees, hands out wide into abduction, raise hands overhead, reach forward down to ground, back up to sky then back out to abduction and back to knees x 10 L-ateral shift with arm swing for seated warrior pose x10 each side  -side to side reach big and hold sweeping cross body then return to center upright posture modified off of LSVT BIG program for cross body coordination in seated position, more challenging to the L.   -Quantum leg press #  200 lb x15, second set #210 12x  cueing for sequencing and slowing velocity for maximal muscle recruitment   -Quantum leg press, single limb #105 12x and then 10x second set each limb; 2 sets using for sequencing and slow velocity for maximal muscle recruitment   -modified squat with chair behind patient,utilized 7lb dumbells with airex pad in seat: 15x pause squats.   Large swiss ball forward rollout 10x.  Standing:   Forward backwards onto tiptoe rocking with alternating arm swing.10x each side, CGA; first round without UE swing 15x, tactile cueing chest for upright posture.        Pt educated throughout session about proper posture and technique with exercises. Improved exercise technique, movement at target joints, use of target muscles after min to mod verbal, visual, tactile cues.                        PT Education - 12/12/18 0946    Education provided  Yes    Education Details  exercise technique, stability     Person(s) Educated  Patient    Methods  Explanation;Demonstration;Tactile cues;Verbal cues    Comprehension  Verbalized understanding;Returned demonstration;Verbal cues required;Tactile cues required       PT Short Term Goals - 11/21/18 0939      PT SHORT TERM GOAL #1   Title  Pt will perform HEP with family's supervision, for improved balance, transfers, and gait.      Baseline  7/7 HEP given; 8/5 pt reported that his is not performing his HEP consistently, still has his paper copy    Time  2    Period  Weeks    Status  On-going    Target Date  12/05/18        PT Long Term Goals - 11/28/18 5997  PT LONG TERM GOAL #1   Title  Patient (> 67 years old) will complete five times sit to stand test in < 15 seconds indicating an increased LE strength and improved balance.    Baseline  7/7: 20 seconds with UE support and one posterior LOB 8/12: 15 seconds one hand on knee    Time  8    Period  Weeks    Status  Partially Met    Target Date  12/18/18      PT LONG TERM GOAL #2   Title  Patient will increase 10 meter walk test to >1.17ms as to improve gait speed for better community ambulation and to reduce fall risk.    Baseline  7/7: .58 m/s with U step walker 8/12: 1.0 m/s    Time  8    Period  Weeks    Status  Achieved      PT LONG TERM GOAL #3   Title  Patient will increase Berg Balance score by > 6 points (31/56) to demonstrate decreased fall risk during functional activities.    Baseline  7/7: 25/56 8/12/: 36/56    Time  8    Period  Weeks    Status  Revised    Target Date  12/18/18      PT LONG TERM GOAL #4   Title  Patient will deny any falls over past 4 weeks to demonstrate improved safety awareness at home and in the community.    Baseline  7/7: averages 6 falls/month 8/12: 4 falls in past month    Time  8    Period  Weeks    Status  Partially Met    Target Date  12/18/18            Plan - 12/12/18 1140    Clinical Impression Statement  Patient  initially presents with shaking and fatigue which improves as session progressed. Patient is challenged with cross body coordination and will benefit from continued coordination/ postural interventions to increase smoothness of muscle activation with decreased tremors, shuffle, and LOB. Patient responds well to high weight strengthening interventions with improved coordination with familiar weight lifting tasks. Patient will benefit from skilled physical therapy to increase stability, strength, mobility, and posture to decrease falls risk and improve quality of life    Personal Factors and Comorbidities  Age;Comorbidity 3+;Past/Current Experience;Time since onset of injury/illness/exacerbation    Comorbidities  athritis, bradycardia, cancer, depression, dysrhythmia, GERD, HTN, Parkinson's, PTSD, SOB, sleep apnea    Examination-Activity Limitations  Bathing;Bed Mobility;Caring for Others;Bend;Dressing;Lift;Locomotion Level;Reach Overhead;Sit;Squat;Stairs;Stand;Toileting;Transfers    Examination-Participation Restrictions  Church;Cleaning;Community Activity;Interpersonal Relationship;Shop;Volunteer;Yard Work;Other    Stability/Clinical Decision Making  Unstable/Unpredictable    Rehab Potential  Fair    PT Frequency  2x / week    PT Duration  8 weeks    PT Treatment/Interventions  ADLs/Self Care Home Management;Aquatic Therapy;Cryotherapy;Electrical Stimulation;Iontophoresis 474mml Dexamethasone;Moist Heat;Ultrasound;Contrast Bath;DME Instruction;Gait training;Stair training;Functional mobility training;Therapeutic activities;Therapeutic exercise;Balance training;Neuromuscular re-education;Patient/family education;Manual techniques;Wheelchair mobility training;Energy conservation;Passive range of motion;Traction;Orthotic Fit/Training    PT Next Visit Plan  goals    PT Home Exercise Plan  see above    Consulted and Agree with Plan of Care  Patient;Family member/caregiver    Family Member Consulted  wife        Patient will benefit from skilled therapeutic intervention in order to improve the following deficits and impairments:  Abnormal gait, Decreased balance, Decreased coordination, Decreased mobility, Impaired tone, Postural dysfunction, Decreased strength, Decreased safety awareness, Improper body mechanics, Impaired  flexibility, Decreased activity tolerance, Decreased endurance, Decreased knowledge of precautions, Difficulty walking, Pain  Visit Diagnosis: Other abnormalities of gait and mobility  Unsteadiness on feet  Abnormal posture     Problem List Patient Active Problem List   Diagnosis Date Noted  . Rib pain on right side 09/25/2018  . Skin sore 12/01/2017  . Hematoma 10/18/2017  . Accidental fall 10/18/2017  . 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 Radicular pain in right arm 10/09/2016  . GERD (gastroesophageal reflux disease) 07/31/2016  . Trochanteric bursitis 03/03/2016  . Left hand pain 10/30/2015  . Fracture, finger, multiple sites 10/30/2015  . Colon cancer screening 07/23/2015  . Healthcare maintenance 07/23/2015  . Gout 02/18/2015  . Depression 01/06/2015  . Irritation of eyelid 08/20/2014  . Dupuytren's contracture 08/20/2014  . Cough 07/21/2014  . Lumbar stenosis with neurogenic claudication 06/13/2014  . Preop cardiovascular exam 05/28/2014  . S/P deep brain stimulator placement 05/08/2014  . Joint pain 01/22/2014  . Medicare annual wellness visit, initial 01/19/2014  . Advance care planning 01/19/2014  . Parkinson's disease ([place]) 12/13/2013  . PTSD (post-traumatic stress disorder) 06/13/2013  . Erectile dysfunction 06/13/2013  . HLD (hyperlipidemia) 06/13/2013  . Hip pain 06/10/2013  . Pain in joint, shoulder region 06/10/2013  . Right leg swelling 06/03/2013  . Essential hypertension 06/03/2013  . Bradycardia by electrocardiogram 06/03/2013  . Obstructive sleep apnea 03/12/2013  . Spinal stenosis, lumbar region, with neurogenic claudication  12/14/2012   Janna Arch, PT, DPT   12/12/2018, 11:43 AM  [place] MAIN [facility] SERVICES [address] [place], [place], [address] Phone: [phone]   Fax:  [phone]  Name: [name] MRN: [id number] Date of Birth: 1950-09-08

## 2018-12-17 ENCOUNTER — Ambulatory Visit: Payer: Medicare PPO

## 2018-12-17 ENCOUNTER — Other Ambulatory Visit: Payer: Self-pay

## 2018-12-17 DIAGNOSIS — R296 Repeated falls: Secondary | ICD-10-CM | POA: Diagnosis not present

## 2018-12-17 DIAGNOSIS — R293 Abnormal posture: Secondary | ICD-10-CM | POA: Diagnosis not present

## 2018-12-17 DIAGNOSIS — R262 Difficulty in walking, not elsewhere classified: Secondary | ICD-10-CM | POA: Diagnosis not present

## 2018-12-17 DIAGNOSIS — R2689 Other abnormalities of gait and mobility: Secondary | ICD-10-CM

## 2018-12-17 DIAGNOSIS — R29818 Other symptoms and signs involving the nervous system: Secondary | ICD-10-CM | POA: Diagnosis not present

## 2018-12-17 DIAGNOSIS — R278 Other lack of coordination: Secondary | ICD-10-CM | POA: Diagnosis not present

## 2018-12-17 DIAGNOSIS — R29898 Other symptoms and signs involving the musculoskeletal system: Secondary | ICD-10-CM | POA: Diagnosis not present

## 2018-12-17 DIAGNOSIS — M6281 Muscle weakness (generalized): Secondary | ICD-10-CM | POA: Diagnosis not present

## 2018-12-17 DIAGNOSIS — R2681 Unsteadiness on feet: Secondary | ICD-10-CM | POA: Diagnosis not present

## 2018-12-17 NOTE — Therapy (Signed)
[place] MAIN [facility] SERVICES [address] [place], [place], [address] Phone: [phone]   Fax:  [phone]  Physical Therapy Treatment/ [place]  Patient Details  Name: [name] MRN: [id number] Date of Birth: [birth year] Referring Provider (PT): Gillermo Murdoch, NP   Encounter Date: 12/17/2018  PT End of Session - 12/17/18 6962    Visit Number  14    Number of Visits  30    Date for PT Re-Evaluation  02/11/19    Authorization Type  4/10 with PN on 11/28/18    PT Start Time  0927    PT Stop Time  1011    PT Time Calculation (min)  44 min    Equipment Utilized During Treatment  Gait belt    Activity Tolerance  Patient tolerated treatment well;Patient limited by pain    Behavior During Therapy  [facility] for tasks assessed/performed       Past Medical History:  Diagnosis Date  . Arthritis   . Bradycardia   . Cancer [facility]) 2013   skin cancer  . Depression    ptsd  . Dysrhythmia    chronic slow heart rate  . GERD (gastroesophageal reflux disease)   . Headache(784.0)    tension headaches non recent  . History of chicken pox   . History of kidney stones    passed  . Hypertension    treated with HCTZ  . Parkinson's disease ([place])    dx'ed 15 years ago  . PTSD (post-traumatic stress disorder)   . Shortness of breath dyspnea   . Sleep apnea    doesn't use C-pap  . Varicose veins     Past Surgical History:  Procedure Laterality Date  . CHOLECYSTECTOMY N/A 10/22/2014   Procedure: LAPAROSCOPIC CHOLECYSTECTOMY WITH INTRAOPERATIVE CHOLANGIOGRAM;  Surgeon: Dia Crawford III, MD;  Location: [facility] ORS;  Service: General;  Laterality: N/A;  . cyst removed      from lip as a child  . LUMBAR LAMINECTOMY/DECOMPRESSION MICRODISCECTOMY Bilateral 12/14/2012   Procedure: Bilateral lumbar three-four, four-five decompressive laminotomy/foraminotomy;  Surgeon: Charlie Pitter, MD;  Location: [place] NEURO ORS;  Service: Neurosurgery;  Laterality: Bilateral;  .  PULSE GENERATOR IMPLANT Bilateral 12/13/2013   Procedure: Bilateral implantable pulse generator placement;  Surgeon: Erline Levine, MD;  Location: [place] [facility] ORS;  Service: Neurosurgery;  Laterality: Bilateral;  Bilateral implantable pulse generator placement  . skin cancer removed     from ears,   12 lft arm  rt leg 15  . SUBTHALAMIC STIMULATOR BATTERY REPLACEMENT Bilateral 07/14/2017   Procedure: BILATERAL IMPLANTED PULSE GENERATOR CHANGE FOR DEEP BRAIN STIMULATOR;  Surgeon: Erline Levine, MD;  Location: [place];  Service: Neurosurgery;  Laterality: Bilateral;  . SUBTHALAMIC STIMULATOR INSERTION Bilateral 12/06/2013   Procedure: SUBTHALAMIC STIMULATOR INSERTION;  Surgeon: Erline Levine, MD;  Location: [place] NEURO ORS;  Service: Neurosurgery;  Laterality: Bilateral;  Bilateral deep brain stimulator placement    There were no vitals filed for this visit.  Subjective Assessment - 12/17/18 1106    Subjective  Patient reports he was able to stand longer in church yesterday and is going 2.5 weeks since last fall.    Patient is accompained by:  Family member    Pertinent History  Patient is a pleasant 68 year old male who presents for for Parkinson's. Patient is awaiting disability hearing. He has an aide 14 hours/week and uses a walker/u step at baseline. Patient has been seen for physical therapy at this clinic last year  and was discharged to home health due to increasing fall risk and limited ability to negotiate home environment.  At time of discharge this physical therapist was advised to not encourage patient to ambulate and to use wheelchair by referring physician. Patient has since switched physicians to a neurosurgeon at [facility] who altered his deep brain stimulator with positive effects in January and re-adjusted it again about a month ago. Is now allowed to walk and encouraged to do so as frequently as possible. Patient continues to have balance deficits having approximately 5-6 falls a month. PMH includes:  Parkinson's, HTN, GERD, HLD, spinal stenosis, PTSD, Deep brain stimulator placement, cancer, and bradycardia.    Limitations  Walking;Standing;Sitting;House hold activities;Lifting    How long can you sit comfortably?  hours    How long can you stand comfortably?  3 minutes but varies    How long can you walk comfortably?  1-2 minutes    Patient Stated Goals  to stand and walk for longer distances for exercises.    Currently in Pain?  No/denies          RECERT:  Goals performed 8/12 (4 visits prior) New goal: 6 MWT: 551 ft with U step walker  Treatment:    Seated -Out up down up : hands on knees, hands out wide into abduction, raise hands overhead, reach forward down to ground, back up to sky then back out to abduction and back to knees x 10 L-ateral shift with arm swing for seated warrior pose x10 each side   -side to side reach big and hold sweeping cross body then return to center upright posture modified off of LSVT BIG program for cross body coordination in seated position, more challenging to the L.    -modified squat with chair behind patient, utilized 7lb dumbbells with airex pad in seat: 15x   -modified pause/pulse squat, chair behind patient with airex pad in seat: 7lb dumbells in hand. Hold 3 seconds with pulse then return to standing  .     Standing:    Forward backwards onto tiptoe rocking with alternating arm swing.10x each side, CGA; first round without UE swing 15x, tactile cueing chest for upright posture.     Start big, step sideways, end big: step out to the side while reaching out with big arms and hands, turn foot back to the start. X10 each direction.        Pt educated throughout session about proper posture and technique with exercises. Improved exercise technique, movement at target joints, use of target muscles after min to mod verbal, visual, tactile cues.     Patient is progressing towards functional goals with improved mobility, stability, and  strength. Please refer back to the note on 11/28/18 (4 visits prior) for details on goal progression. Progressing/meeting STS goal, BERG, and10 MWT goal. Introducing a 6 minute walk test goal due to improved mobility and capacity for mobility and revising 5x STS and BERG for improved mobility. shuffle, and LOB. Patient responds well to high weight strengthening interventions with improved coordination with familiar weight lifting tasks. Patient will benefit from skilled physical therapy to increase stability, strength, mobility, and posture to decrease falls risk and improve quality of life                   PT Education - 12/17/18 0920    Education provided  Yes    Education Details  exercise technique, stability    Person(s) Educated  Patient  Methods  Explanation;Demonstration;Tactile cues;Verbal cues    Comprehension  Verbalized understanding;Returned demonstration;Verbal cues required;Tactile cues required       PT Short Term Goals - 12/17/18 0923      PT SHORT TERM GOAL #1   Title  Pt will perform HEP with family's supervision, for improved balance, transfers, and gait.      Baseline  7/7 HEP given; 8/5 pt reported that his is not performing his HEP consistently, still has his paper copy 8/31 compliant    Time  2    Period  Weeks    Status  Achieved    Target Date  12/05/18        PT Long Term Goals - 12/17/18 0924      PT LONG TERM GOAL #1   Title  Patient (> 69 years old) will complete five times sit to stand test in < 15 seconds without UE support indicating an increased LE strength and improved balance.    Baseline  7/7: 20 seconds with UE support and one posterior LOB 8/12: 15 seconds one hand on knee    Time  8    Period  Weeks    Status  Revised    Target Date  02/11/19      PT LONG TERM GOAL #2   Title  Patient will increase 10 meter walk test to >1.65ms as to improve gait speed for better community ambulation and to reduce fall risk.    Baseline   7/7: .58 m/s with U step walker 8/12: 1.0 m/s    Time  8    Period  Weeks    Status  Achieved      PT LONG TERM GOAL #3   Title  Patient will increase Berg Balance score by > 6 points (42/56) to demonstrate decreased fall risk during functional activities.    Baseline  7/7: 25/56 8/12/: 36/56    Time  8    Period  Weeks    Status  Revised    Target Date  02/11/19      PT LONG TERM GOAL #4   Title  Patient will deny any falls over past 4 weeks to demonstrate improved safety awareness at home and in the community.    Baseline  7/7: averages 6 falls/month 8/12: 4 falls in past month    Time  8    Period  Weeks    Status  Partially Met    Target Date  02/11/19      PT LONG TERM GOAL #5   Title  Patient will increase six minute walk test distance to >1000 for progression to community ambulator and improve gait ability    Baseline  12/17/18 : 551 ft with U step walker    Time  8    Period  Weeks    Status  New    Target Date  02/11/19            Plan - 12/17/18 1107    Clinical Impression Statement  Patient is progressing towards functional goals with improved mobility, stability, and strength. Please refer back to the note on 11/28/18 (4 visits prior) for details on goal progression. Progressing/meeting STS goal, BERG, and10 MWT goal. Introducing a 6 minute walk test goal due to improved mobility and capacity for mobility and revising 5x STS and BERG for improved mobility. shuffle, and LOB. Patient responds well to high weight strengthening interventions with improved coordination with familiar weight lifting tasks. Patient will benefit  from skilled physical therapy to increase stability, strength, mobility, and posture to decrease falls risk and improve quality of life    Personal Factors and Comorbidities  Age;Comorbidity 3+;Past/Current Experience;Time since onset of injury/illness/exacerbation    Comorbidities  athritis, bradycardia, cancer, depression, dysrhythmia, GERD, HTN,  Parkinson's, PTSD, SOB, sleep apnea    Examination-Activity Limitations  Bathing;Bed Mobility;Caring for Others;Bend;Dressing;Lift;Locomotion Level;Reach Overhead;Sit;Squat;Stairs;Stand;Toileting;Transfers    Examination-Participation Restrictions  Church;Cleaning;Community Activity;Interpersonal Relationship;Shop;Volunteer;Yard Work;Other    Stability/Clinical Decision Making  Unstable/Unpredictable    Rehab Potential  Fair    PT Frequency  2x / week    PT Duration  8 weeks    PT Treatment/Interventions  ADLs/Self Care Home Management;Aquatic Therapy;Cryotherapy;Electrical Stimulation;Iontophoresis '4mg'$ /ml Dexamethasone;Moist Heat;Ultrasound;Contrast Bath;DME Instruction;Gait training;Stair training;Functional mobility training;Therapeutic activities;Therapeutic exercise;Balance training;Neuromuscular re-education;Patient/family education;Manual techniques;Wheelchair mobility training;Energy conservation;Passive range of motion;Traction;Orthotic Fit/Training    PT Next Visit Plan  goals    PT Home Exercise Plan  see above    Consulted and Agree with Plan of Care  Patient;Family member/caregiver    Family Member Consulted  wife       Patient will benefit from skilled therapeutic intervention in order to improve the following deficits and impairments:  Abnormal gait, Decreased balance, Decreased coordination, Decreased mobility, Impaired tone, Postural dysfunction, Decreased strength, Decreased safety awareness, Improper body mechanics, Impaired flexibility, Decreased activity tolerance, Decreased endurance, Decreased knowledge of precautions, Difficulty walking, Pain  Visit Diagnosis: Other abnormalities of gait and mobility  Unsteadiness on feet  Abnormal posture     Problem List Patient Active Problem List   Diagnosis Date Noted  . Rib pain on right side 09/25/2018  . Skin sore 12/01/2017  . Hematoma 10/18/2017  . Accidental fall 10/18/2017  . 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  Radicular pain in right arm 10/09/2016  . GERD (gastroesophageal reflux disease) 07/31/2016  . Trochanteric bursitis 03/03/2016  . Left hand pain 10/30/2015  . Fracture, finger, multiple sites 10/30/2015  . Colon cancer screening 07/23/2015  . Healthcare maintenance 07/23/2015  . Gout 02/18/2015  . Depression 01/06/2015  . Irritation of eyelid 08/20/2014  . Dupuytren's contracture 08/20/2014  . Cough 07/21/2014  . Lumbar stenosis with neurogenic claudication 06/13/2014  . Preop cardiovascular exam 05/28/2014  . S/P deep brain stimulator placement 05/08/2014  . Joint pain 01/22/2014  . Medicare annual wellness visit, initial 01/19/2014  . Advance care planning 01/19/2014  . Parkinson's disease ([place]) 12/13/2013  . PTSD (post-traumatic stress disorder) 06/13/2013  . Erectile dysfunction 06/13/2013  . HLD (hyperlipidemia) 06/13/2013  . Hip pain 06/10/2013  . Pain in joint, shoulder region 06/10/2013  . Right leg swelling 06/03/2013  . Essential hypertension 06/03/2013  . Bradycardia by electrocardiogram 06/03/2013  . Obstructive sleep apnea 03/12/2013  . Spinal stenosis, lumbar region, with neurogenic claudication 12/14/2012   Janna Arch, PT, DPT   12/17/2018, 11:09 AM  [place] MAIN [facility] SERVICES [address] [place], [place], [address] Phone: [phone]   Fax:  [phone]  Name: [name] MRN: [id number] Date of Birth: 1951-02-19

## 2018-12-19 ENCOUNTER — Ambulatory Visit: Payer: Medicare PPO | Attending: Physician Assistant

## 2018-12-19 ENCOUNTER — Other Ambulatory Visit: Payer: Self-pay

## 2018-12-19 DIAGNOSIS — R278 Other lack of coordination: Secondary | ICD-10-CM | POA: Diagnosis not present

## 2018-12-19 DIAGNOSIS — R2689 Other abnormalities of gait and mobility: Secondary | ICD-10-CM | POA: Diagnosis not present

## 2018-12-19 DIAGNOSIS — R293 Abnormal posture: Secondary | ICD-10-CM | POA: Diagnosis not present

## 2018-12-19 DIAGNOSIS — R262 Difficulty in walking, not elsewhere classified: Secondary | ICD-10-CM | POA: Diagnosis not present

## 2018-12-19 DIAGNOSIS — M6281 Muscle weakness (generalized): Secondary | ICD-10-CM | POA: Insufficient documentation

## 2018-12-19 DIAGNOSIS — G20 Parkinson's disease: Secondary | ICD-10-CM | POA: Diagnosis not present

## 2018-12-19 DIAGNOSIS — R2681 Unsteadiness on feet: Secondary | ICD-10-CM | POA: Insufficient documentation

## 2018-12-19 DIAGNOSIS — R296 Repeated falls: Secondary | ICD-10-CM | POA: Diagnosis not present

## 2018-12-19 NOTE — Therapy (Signed)
[place] MAIN [facility] SERVICES [address] [place], [place], [address] Phone: [phone]   Fax:  [phone]  Physical Therapy Treatment  Patient Details  Name: [name] MRN: [id number] Date of Birth: [birth year] Referring Provider (PT): Gillermo Murdoch, NP   Encounter Date: 12/19/2018  PT End of Session - 12/19/18 0914    Visit Number  15    Number of Visits  30    Date for PT Re-Evaluation  02/11/19    Authorization Type  5/10 with PN on 11/28/18    PT Start Time  0918    PT Stop Time  1005    PT Time Calculation (min)  47 min    Equipment Utilized During Treatment  Gait belt    Activity Tolerance  Patient tolerated treatment well;Patient limited by pain    Behavior During Therapy  [facility] for tasks assessed/performed       Past Medical History:  Diagnosis Date  . Arthritis   . Bradycardia   . Cancer [facility]) 2013   skin cancer  . Depression    ptsd  . Dysrhythmia    chronic slow heart rate  . GERD (gastroesophageal reflux disease)   . Headache(784.0)    tension headaches non recent  . History of chicken pox   . History of kidney stones    passed  . Hypertension    treated with HCTZ  . Parkinson's disease ([place])    dx'ed 15 years ago  . PTSD (post-traumatic stress disorder)   . Shortness of breath dyspnea   . Sleep apnea    doesn't use C-pap  . Varicose veins     Past Surgical History:  Procedure Laterality Date  . CHOLECYSTECTOMY N/A 10/22/2014   Procedure: LAPAROSCOPIC CHOLECYSTECTOMY WITH INTRAOPERATIVE CHOLANGIOGRAM;  Surgeon: Dia Crawford III, MD;  Location: [facility] ORS;  Service: General;  Laterality: N/A;  . cyst removed      from lip as a child  . LUMBAR LAMINECTOMY/DECOMPRESSION MICRODISCECTOMY Bilateral 12/14/2012   Procedure: Bilateral lumbar three-four, four-five decompressive laminotomy/foraminotomy;  Surgeon: Charlie Pitter, MD;  Location: [place] NEURO ORS;  Service: Neurosurgery;  Laterality: Bilateral;  . PULSE  GENERATOR IMPLANT Bilateral 12/13/2013   Procedure: Bilateral implantable pulse generator placement;  Surgeon: Erline Levine, MD;  Location: [place] NEURO ORS;  Service: Neurosurgery;  Laterality: Bilateral;  Bilateral implantable pulse generator placement  . skin cancer removed     from ears,   12 lft arm  rt leg 15  . SUBTHALAMIC STIMULATOR BATTERY REPLACEMENT Bilateral 07/14/2017   Procedure: BILATERAL IMPLANTED PULSE GENERATOR CHANGE FOR DEEP BRAIN STIMULATOR;  Surgeon: Erline Levine, MD;  Location: [place];  Service: Neurosurgery;  Laterality: Bilateral;  . SUBTHALAMIC STIMULATOR INSERTION Bilateral 12/06/2013   Procedure: SUBTHALAMIC STIMULATOR INSERTION;  Surgeon: Erline Levine, MD;  Location: [place] NEURO ORS;  Service: Neurosurgery;  Laterality: Bilateral;  Bilateral deep brain stimulator placement    There were no vitals filed for this visit.  Subjective Assessment - 12/19/18 0921    Subjective  Patient reports he was having knee pain yesterday 7/10, is better today. No falls in past 3 weeks    Patient is accompained by:  Family member    Pertinent History  Patient is a pleasant 68 year old male who presents for for Parkinson's. Patient is awaiting disability hearing. He has an aide 14 hours/week and uses a walker/u step at baseline. Patient has been seen for physical therapy at this clinic last year and was  discharged to home health due to increasing fall risk and limited ability to negotiate home environment.  At time of discharge this physical therapist was advised to not encourage patient to ambulate and to use wheelchair by referring physician. Patient has since switched physicians to a neurosurgeon at [facility] who altered his deep brain stimulator with positive effects in January and re-adjusted it again about a month ago. Is now allowed to walk and encouraged to do so as frequently as possible. Patient continues to have balance deficits having approximately 5-6 falls a month. PMH includes: Parkinson's,  HTN, GERD, HLD, spinal stenosis, PTSD, Deep brain stimulator placement, cancer, and bradycardia.    Limitations  Walking;Standing;Sitting;House hold activities;Lifting    How long can you sit comfortably?  hours    How long can you stand comfortably?  3 minutes but varies    How long can you walk comfortably?  1-2 minutes    Patient Stated Goals  to stand and walk for longer distances for exercises.    Currently in Pain?  No/denies       Treatment:    Seated -Out up down up : hands on knees, hands out wide into abduction, raise hands overhead, reach forward down to ground, back up to sky then back out to abduction and back to knees x 10 L-ateral shift with arm swing for seated warrior pose x10 each side   -side to side reach big and hold sweeping cross body then return to center upright posture modified off of LSVT BIG program for cross body coordination in seated position, more challenging to the L.    4" step toe taps seated for coordination, muscle activation/spatial awareness, 2x 30 seconds   Large swiss ball forward rollout 10x.  Seated adduction squeeze 10x 3 second holds  Seated balloon taps inside and outside BOS with upright trunk position, reaching outside BOS and able to return to center without LOB x4 minutes  GTB hamstring curls 15x each LE   Standing:    Forward backwards onto tiptoe rocking with alternating arm swing.10x each side, CGA; first round without UE swing 15x, tactile cueing chest for upright posture.    Ambulate in hallway: horizontal head turns reading playing cards on walls , cueing for widening BOS 2x 86 ft  Red light green light: initiation/terminatin of movement based on PT command with U step 2x 86 ft  Step over and back theraband on floor 10x each LE, no UE support, more challenging stepping over band with LLE than RLE.      static stand with weighted ball (2000 gr) 15x chest press  Static stand with weighted ball (2000 gr) 15x straight arm  raise    Pt educated throughout session about proper posture and technique with exercises. Improved exercise technique, movement at target joints, use of target muscles after min to mod verbal, visual, tactile cues.                        PT Education - 12/19/18 0914    Education provided  Yes    Education Details  exercise technique, body mechanics    Person(s) Educated  Patient    Methods  Explanation;Demonstration;Tactile cues;Verbal cues    Comprehension  Verbalized understanding;Returned demonstration;Verbal cues required;Tactile cues required       PT Short Term Goals - 12/17/18 0923      PT SHORT TERM GOAL #1   Title  Pt will perform HEP with family's supervision, for  improved balance, transfers, and gait.      Baseline  7/7 HEP given; 8/5 pt reported that his is not performing his HEP consistently, still has his paper copy 8/31 compliant    Time  2    Period  Weeks    Status  Achieved    Target Date  12/05/18        PT Long Term Goals - 12/17/18 0924      PT LONG TERM GOAL #1   Title  Patient (> 68 years old) will complete five times sit to stand test in < 15 seconds without UE support indicating an increased LE strength and improved balance.    Baseline  7/7: 20 seconds with UE support and one posterior LOB 8/12: 15 seconds one hand on knee    Time  8    Period  Weeks    Status  Revised    Target Date  02/11/19      PT LONG TERM GOAL #2   Title  Patient will increase 10 meter walk test to >1.84ms as to improve gait speed for better community ambulation and to reduce fall risk.    Baseline  7/7: .58 m/s with U step walker 8/12: 1.0 m/s    Time  8    Period  Weeks    Status  Achieved      PT LONG TERM GOAL #3   Title  Patient will increase Berg Balance score by > 6 points (42/56) to demonstrate decreased fall risk during functional activities.    Baseline  7/7: 25/56 8/12/: 36/56    Time  8    Period  Weeks    Status  Revised    Target  Date  02/11/19      PT LONG TERM GOAL #4   Title  Patient will deny any falls over past 4 weeks to demonstrate improved safety awareness at home and in the community.    Baseline  7/7: averages 6 falls/month 8/12: 4 falls in past month    Time  8    Period  Weeks    Status  Partially Met    Target Date  02/11/19      PT LONG TERM GOAL #5   Title  Patient will increase six minute walk test distance to >1000 for progression to community ambulator and improve gait ability    Baseline  12/17/18 : 551 ft with U step walker    Time  8    Period  Weeks    Status  New    Target Date  02/11/19            Plan - 12/19/18 1012    Clinical Impression Statement  Patient presents with good motivation. He is progressing with ambulatory stability with head movements and sudden initiation and termination of movement without LOB while using his AD.  Increased coordination with repeated LSVT derived interventions noted.  Patient will benefit from skilled physical therapy to increase stability, strength, mobility, and posture to decrease falls risk and improve quality of life    Personal Factors and Comorbidities  Age;Comorbidity 3+;Past/Current Experience;Time since onset of injury/illness/exacerbation    Comorbidities  athritis, bradycardia, cancer, depression, dysrhythmia, GERD, HTN, Parkinson's, PTSD, SOB, sleep apnea    Examination-Activity Limitations  Bathing;Bed Mobility;Caring for Others;Bend;Dressing;Lift;Locomotion Level;Reach Overhead;Sit;Squat;Stairs;Stand;Toileting;Transfers    Examination-Participation Restrictions  Church;Cleaning;Community Activity;Interpersonal Relationship;Shop;Volunteer;Yard Work;Other    Stability/Clinical Decision Making  Unstable/Unpredictable    Rehab Potential  Fair    PT  Frequency  2x / week    PT Duration  8 weeks    PT Treatment/Interventions  ADLs/Self Care Home Management;Aquatic Therapy;Cryotherapy;Electrical Stimulation;Iontophoresis '4mg'$ /ml  Dexamethasone;Moist Heat;Ultrasound;Contrast Bath;DME Instruction;Gait training;Stair training;Functional mobility training;Therapeutic activities;Therapeutic exercise;Balance training;Neuromuscular re-education;Patient/family education;Manual techniques;Wheelchair mobility training;Energy conservation;Passive range of motion;Traction;Orthotic Fit/Training    PT Next Visit Plan  goals    PT Home Exercise Plan  see above    Consulted and Agree with Plan of Care  Patient;Family member/caregiver    Family Member Consulted  wife       Patient will benefit from skilled therapeutic intervention in order to improve the following deficits and impairments:  Abnormal gait, Decreased balance, Decreased coordination, Decreased mobility, Impaired tone, Postural dysfunction, Decreased strength, Decreased safety awareness, Improper body mechanics, Impaired flexibility, Decreased activity tolerance, Decreased endurance, Decreased knowledge of precautions, Difficulty walking, Pain  Visit Diagnosis: Other abnormalities of gait and mobility  Unsteadiness on feet  Abnormal posture     Problem List Patient Active Problem List   Diagnosis Date Noted  . Rib pain on right side 09/25/2018  . Skin sore 12/01/2017  . Hematoma 10/18/2017  . Accidental fall 10/18/2017  . 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 Radicular pain in right arm 10/09/2016  . GERD (gastroesophageal reflux disease) 07/31/2016  . Trochanteric bursitis 03/03/2016  . Left hand pain 10/30/2015  . Fracture, finger, multiple sites 10/30/2015  . Colon cancer screening 07/23/2015  . Healthcare maintenance 07/23/2015  . Gout 02/18/2015  . Depression 01/06/2015  . Irritation of eyelid 08/20/2014  . Dupuytren's contracture 08/20/2014  . Cough 07/21/2014  . Lumbar stenosis with neurogenic claudication 06/13/2014  . Preop cardiovascular exam 05/28/2014  . S/P deep brain stimulator placement 05/08/2014  . Joint pain 01/22/2014  . Medicare  annual wellness visit, initial 01/19/2014  . Advance care planning 01/19/2014  . Parkinson's disease ([place]) 12/13/2013  . PTSD (post-traumatic stress disorder) 06/13/2013  . Erectile dysfunction 06/13/2013  . HLD (hyperlipidemia) 06/13/2013  . Hip pain 06/10/2013  . Pain in joint, shoulder region 06/10/2013  . Right leg swelling 06/03/2013  . Essential hypertension 06/03/2013  . Bradycardia by electrocardiogram 06/03/2013  . Obstructive sleep apnea 03/12/2013  . Spinal stenosis, lumbar region, with neurogenic claudication 12/14/2012   Janna Arch, PT, DPT   12/19/2018, 10:13 AM  [place] [address] [place], [place], [address] Phone: [phone]   Fax:  [phone]  Name: [name] MRN: [id number] Date of Birth: [birth year]

## 2018-12-26 ENCOUNTER — Other Ambulatory Visit: Payer: Self-pay

## 2018-12-26 ENCOUNTER — Ambulatory Visit: Payer: Medicare PPO

## 2018-12-26 DIAGNOSIS — R278 Other lack of coordination: Secondary | ICD-10-CM | POA: Diagnosis not present

## 2018-12-26 DIAGNOSIS — R293 Abnormal posture: Secondary | ICD-10-CM

## 2018-12-26 DIAGNOSIS — R262 Difficulty in walking, not elsewhere classified: Secondary | ICD-10-CM | POA: Diagnosis not present

## 2018-12-26 DIAGNOSIS — R2689 Other abnormalities of gait and mobility: Secondary | ICD-10-CM

## 2018-12-26 DIAGNOSIS — R2681 Unsteadiness on feet: Secondary | ICD-10-CM | POA: Diagnosis not present

## 2018-12-26 DIAGNOSIS — M6281 Muscle weakness (generalized): Secondary | ICD-10-CM | POA: Diagnosis not present

## 2018-12-26 DIAGNOSIS — Z1211 Encounter for screening for malignant neoplasm of colon: Secondary | ICD-10-CM | POA: Diagnosis not present

## 2018-12-26 DIAGNOSIS — R296 Repeated falls: Secondary | ICD-10-CM | POA: Diagnosis not present

## 2018-12-26 DIAGNOSIS — G20 Parkinson's disease: Secondary | ICD-10-CM | POA: Diagnosis not present

## 2018-12-26 NOTE — Therapy (Signed)
[place] MAIN [facility] SERVICES [address] [place], [place], [address] Phone: [phone]   Fax:  [phone]  Physical Therapy Treatment  Patient Details  Name: [name] MRN: [id number] Date of Birth: [birth year] Referring Provider (PT): Gillermo Murdoch, NP   Encounter Date: 12/26/2018  PT End of Session - 12/26/18 1004    Visit Number  16    Number of Visits  30    Date for PT Re-Evaluation  02/11/19    Authorization Type  6/10 with PN on 11/28/18    PT Start Time  0930    PT Stop Time  1014    PT Time Calculation (min)  44 min    Equipment Utilized During Treatment  Gait belt    Activity Tolerance  Patient tolerated treatment well;Patient limited by pain    Behavior During Therapy  [facility] for tasks assessed/performed       Past Medical History:  Diagnosis Date  . Arthritis   . Bradycardia   . Cancer [facility]) 2013   skin cancer  . Depression    ptsd  . Dysrhythmia    chronic slow heart rate  . GERD (gastroesophageal reflux disease)   . Headache(784.0)    tension headaches non recent  . History of chicken pox   . History of kidney stones    passed  . Hypertension    treated with HCTZ  . Parkinson's disease ([place])    dx'ed 15 years ago  . PTSD (post-traumatic stress disorder)   . Shortness of breath dyspnea   . Sleep apnea    doesn't use C-pap  . Varicose veins     Past Surgical History:  Procedure Laterality Date  . CHOLECYSTECTOMY N/A 10/22/2014   Procedure: LAPAROSCOPIC CHOLECYSTECTOMY WITH INTRAOPERATIVE CHOLANGIOGRAM;  Surgeon: Dia Crawford III, MD;  Location: [facility] ORS;  Service: General;  Laterality: N/A;  . cyst removed      from lip as a child  . LUMBAR LAMINECTOMY/DECOMPRESSION MICRODISCECTOMY Bilateral 12/14/2012   Procedure: Bilateral lumbar three-four, four-five decompressive laminotomy/foraminotomy;  Surgeon: Charlie Pitter, MD;  Location: [place] NEURO ORS;  Service: Neurosurgery;  Laterality: Bilateral;  . PULSE  GENERATOR IMPLANT Bilateral 12/13/2013   Procedure: Bilateral implantable pulse generator placement;  Surgeon: Erline Levine, MD;  Location: [place] NEURO ORS;  Service: Neurosurgery;  Laterality: Bilateral;  Bilateral implantable pulse generator placement  . skin cancer removed     from ears,   12 lft arm  rt leg 15  . SUBTHALAMIC STIMULATOR BATTERY REPLACEMENT Bilateral 07/14/2017   Procedure: BILATERAL IMPLANTED PULSE GENERATOR CHANGE FOR DEEP BRAIN STIMULATOR;  Surgeon: Erline Levine, MD;  Location: [place];  Service: Neurosurgery;  Laterality: Bilateral;  . SUBTHALAMIC STIMULATOR INSERTION Bilateral 12/06/2013   Procedure: SUBTHALAMIC STIMULATOR INSERTION;  Surgeon: Erline Levine, MD;  Location: [place] NEURO ORS;  Service: Neurosurgery;  Laterality: Bilateral;  Bilateral deep brain stimulator placement    There were no vitals filed for this visit.  Subjective Assessment - 12/26/18 0934    Subjective  Patient fell friday, hit his head, reports no dizziness, no vision change, and no neausea.    Patient is accompained by:  Family member    Pertinent History  Patient is a pleasant 68 year old male who presents for for Parkinson's. Patient is awaiting disability hearing. He has an aide 14 hours/week and uses a walker/u step at baseline. Patient has been seen for physical therapy at this clinic last year and was discharged to home  health due to increasing fall risk and limited ability to negotiate home environment.  At time of discharge this physical therapist was advised to not encourage patient to ambulate and to use wheelchair by referring physician. Patient has since switched physicians to a neurosurgeon at [facility] who altered his deep brain stimulator with positive effects in January and re-adjusted it again about a month ago. Is now allowed to walk and encouraged to do so as frequently as possible. Patient continues to have balance deficits having approximately 5-6 falls a month. PMH includes: Parkinson's, HTN,  GERD, HLD, spinal stenosis, PTSD, Deep brain stimulator placement, cancer, and bradycardia.    Limitations  Walking;Standing;Sitting;House hold activities;Lifting    How long can you sit comfortably?  hours    How long can you stand comfortably?  3 minutes but varies    How long can you walk comfortably?  1-2 minutes    Patient Stated Goals  to stand and walk for longer distances for exercises.    Currently in Pain?  No/denies         Treatment:  Speed ladder -one foot in each box for increased step length and spatial awareness 10x length of // bars -lateral side step two feet in box for increased hip flexion and abduction with spatial awareness 8x length of // bars  Leg press -Quantum leg press # 225 lb x10; ; second set #240 10x cueing for sequencing and slowing velocity for maximal muscle recruitment  -Quantum leg press, single limb #[id number] then 10x second seteach limb;2 setsusing for sequencing and slow velocity for maximal muscle recruitment Standing  airex pad: one foot on airex pad one foot on 6" step 30 second holds x 2 trials each LE   Big step in // bars, clap then step back, use of lines for increasing BOS and length of step 10x each LE.   Standing balloon taps inside and outside BOS with upright trunk position, reaching outside BOS and able to return to center without LOB x4 minutes     Pt educated throughout session about proper posture and technique with exercises. Improved exercise technique, movement at target joints, use of target muscles after min to mod verbal, visual, tactile cues.                       PT Education - 12/26/18 1004    Education provided  Yes    Education Details  exercise technique, body mechanics    Person(s) Educated  Patient    Methods  Explanation;Demonstration;Tactile cues;Verbal cues    Comprehension  Verbalized understanding;Returned demonstration;Verbal cues required;Tactile cues required       PT  Short Term Goals - 12/17/18 0923      PT SHORT TERM GOAL #1   Title  Pt will perform HEP with family's supervision, for improved balance, transfers, and gait.      Baseline  7/7 HEP given; 8/5 pt reported that his is not performing his HEP consistently, still has his paper copy 8/31 compliant    Time  2    Period  Weeks    Status  Achieved    Target Date  12/05/18        PT Long Term Goals - 12/17/18 0924      PT LONG TERM GOAL #1   Title  Patient (> 56 years old) will complete five times sit to stand test in < 15 seconds without UE support indicating an increased LE strength and improved  balance.    Baseline  7/7: 20 seconds with UE support and one posterior LOB 8/12: 15 seconds one hand on knee    Time  8    Period  Weeks    Status  Revised    Target Date  02/11/19      PT LONG TERM GOAL #2   Title  Patient will increase 10 meter walk test to >1.41ms as to improve gait speed for better community ambulation and to reduce fall risk.    Baseline  7/7: .58 m/s with U step walker 8/12: 1.0 m/s    Time  8    Period  Weeks    Status  Achieved      PT LONG TERM GOAL #3   Title  Patient will increase Berg Balance score by > 6 points (42/56) to demonstrate decreased fall risk during functional activities.    Baseline  7/7: 25/56 8/12/: 36/56    Time  8    Period  Weeks    Status  Revised    Target Date  02/11/19      PT LONG TERM GOAL #4   Title  Patient will deny any falls over past 4 weeks to demonstrate improved safety awareness at home and in the community.    Baseline  7/7: averages 6 falls/month 8/12: 4 falls in past month    Time  8    Period  Weeks    Status  Partially Met    Target Date  02/11/19      PT LONG TERM GOAL #5   Title  Patient will increase six minute walk test distance to >1000 for progression to community ambulator and improve gait ability    Baseline  12/17/18 : 551 ft with U step walker    Time  8    Period  Weeks    Status  New    Target Date   02/11/19            Plan - 12/26/18 1006    Clinical Impression Statement  Patient progressing with functional stability with dynamic surfaces with no LOB. Patient progressing with strength with increased resistance performed in strengthening interventions. Patient has one near LOB with reaching outside BOS. Patient will benefit from skilled physical therapy to increase stability, strength, mobility, and posture to decrease falls risk and improve quality of life    Personal Factors and Comorbidities  Age;Comorbidity 3+;Past/Current Experience;Time since onset of injury/illness/exacerbation    Comorbidities  athritis, bradycardia, cancer, depression, dysrhythmia, GERD, HTN, Parkinson's, PTSD, SOB, sleep apnea    Examination-Activity Limitations  Bathing;Bed Mobility;Caring for Others;Bend;Dressing;Lift;Locomotion Level;Reach Overhead;Sit;Squat;Stairs;Stand;Toileting;Transfers    Examination-Participation Restrictions  Church;Cleaning;Community Activity;Interpersonal Relationship;Shop;Volunteer;Yard Work;Other    Stability/Clinical Decision Making  Unstable/Unpredictable    Rehab Potential  Fair    PT Frequency  2x / week    PT Duration  8 weeks    PT Treatment/Interventions  ADLs/Self Care Home Management;Aquatic Therapy;Cryotherapy;Electrical Stimulation;Iontophoresis '4mg'$ /ml Dexamethasone;Moist Heat;Ultrasound;Contrast Bath;DME Instruction;Gait training;Stair training;Functional mobility training;Therapeutic activities;Therapeutic exercise;Balance training;Neuromuscular re-education;Patient/family education;Manual techniques;Wheelchair mobility training;Energy conservation;Passive range of motion;Traction;Orthotic Fit/Training    PT Next Visit Plan  progress weight, 1 RM 300    PT Home Exercise Plan  see above    Consulted and Agree with Plan of Care  Patient;Family member/caregiver    Family Member Consulted  wife       Patient will benefit from skilled therapeutic intervention in order  to improve the following deficits and impairments:  Abnormal gait, Decreased balance,  Decreased coordination, Decreased mobility, Impaired tone, Postural dysfunction, Decreased strength, Decreased safety awareness, Improper body mechanics, Impaired flexibility, Decreased activity tolerance, Decreased endurance, Decreased knowledge of precautions, Difficulty walking, Pain  Visit Diagnosis: Other abnormalities of gait and mobility  Unsteadiness on feet  Abnormal posture     Problem List Patient Active Problem List   Diagnosis Date Noted  . Rib pain on right side 09/25/2018  . Skin sore 12/01/2017  . Hematoma 10/18/2017  . Accidental fall 10/18/2017  . 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 Radicular pain in right arm 10/09/2016  . GERD (gastroesophageal reflux disease) 07/31/2016  . Trochanteric bursitis 03/03/2016  . Left hand pain 10/30/2015  . Fracture, finger, multiple sites 10/30/2015  . Colon cancer screening 07/23/2015  . Healthcare maintenance 07/23/2015  . Gout 02/18/2015  . Depression 01/06/2015  . Irritation of eyelid 08/20/2014  . Dupuytren's contracture 08/20/2014  . Cough 07/21/2014  . Lumbar stenosis with neurogenic claudication 06/13/2014  . Preop cardiovascular exam 05/28/2014  . S/P deep brain stimulator placement 05/08/2014  . Joint pain 01/22/2014  . Medicare annual wellness visit, initial 01/19/2014  . Advance care planning 01/19/2014  . Parkinson's disease ([place]) 12/13/2013  . PTSD (post-traumatic stress disorder) 06/13/2013  . Erectile dysfunction 06/13/2013  . HLD (hyperlipidemia) 06/13/2013  . Hip pain 06/10/2013  . Pain in joint, shoulder region 06/10/2013  . Right leg swelling 06/03/2013  . Essential hypertension 06/03/2013  . Bradycardia by electrocardiogram 06/03/2013  . Obstructive sleep apnea 03/12/2013  . Spinal stenosis, lumbar region, with neurogenic claudication 12/14/2012   Janna Arch, PT, DPT   12/26/2018, 10:14 AM  [place] MAIN [facility] SERVICES [address] [place], [place], [address] Phone: [phone]   Fax:  [phone]  Name: [name] MRN: [id number] Date of Birth: [birth year]

## 2018-12-31 ENCOUNTER — Other Ambulatory Visit: Payer: Self-pay

## 2018-12-31 ENCOUNTER — Ambulatory Visit: Payer: Medicare PPO

## 2018-12-31 DIAGNOSIS — R293 Abnormal posture: Secondary | ICD-10-CM

## 2018-12-31 DIAGNOSIS — R2689 Other abnormalities of gait and mobility: Secondary | ICD-10-CM

## 2018-12-31 DIAGNOSIS — G20 Parkinson's disease: Secondary | ICD-10-CM | POA: Diagnosis not present

## 2018-12-31 DIAGNOSIS — R296 Repeated falls: Secondary | ICD-10-CM | POA: Diagnosis not present

## 2018-12-31 DIAGNOSIS — R278 Other lack of coordination: Secondary | ICD-10-CM | POA: Diagnosis not present

## 2018-12-31 DIAGNOSIS — R262 Difficulty in walking, not elsewhere classified: Secondary | ICD-10-CM | POA: Diagnosis not present

## 2018-12-31 DIAGNOSIS — R2681 Unsteadiness on feet: Secondary | ICD-10-CM

## 2018-12-31 DIAGNOSIS — M6281 Muscle weakness (generalized): Secondary | ICD-10-CM | POA: Diagnosis not present

## 2018-12-31 NOTE — Therapy (Signed)
[place] MAIN [facility] SERVICES [address] [place], [place], [address] Phone: [phone]   Fax:  [phone]  Physical Therapy Treatment  Patient Details  Name: [name] MRN: [id number] Date of Birth: [birth year] Referring Provider (PT): Gillermo Murdoch, NP   Encounter Date: 12/31/2018  PT End of Session - 12/31/18 1010    Visit Number  17    Number of Visits  30    Date for PT Re-Evaluation  02/11/19    Authorization Type  7/10 with PN on 11/28/18    PT Start Time  0920    PT Stop Time  1007    PT Time Calculation (min)  47 min    Equipment Utilized During Treatment  Gait belt    Activity Tolerance  Patient tolerated treatment well;Patient limited by pain    Behavior During Therapy  [facility] for tasks assessed/performed       Past Medical History:  Diagnosis Date  . Arthritis   . Bradycardia   . Cancer [facility]) 2013   skin cancer  . Depression    ptsd  . Dysrhythmia    chronic slow heart rate  . GERD (gastroesophageal reflux disease)   . Headache(784.0)    tension headaches non recent  . History of chicken pox   . History of kidney stones    passed  . Hypertension    treated with HCTZ  . Parkinson's disease ([place])    dx'ed 15 years ago  . PTSD (post-traumatic stress disorder)   . Shortness of breath dyspnea   . Sleep apnea    doesn't use C-pap  . Varicose veins     Past Surgical History:  Procedure Laterality Date  . CHOLECYSTECTOMY N/A 10/22/2014   Procedure: LAPAROSCOPIC CHOLECYSTECTOMY WITH INTRAOPERATIVE CHOLANGIOGRAM;  Surgeon: Dia Crawford III, MD;  Location: [facility] ORS;  Service: General;  Laterality: N/A;  . cyst removed      from lip as a child  . LUMBAR LAMINECTOMY/DECOMPRESSION MICRODISCECTOMY Bilateral 12/14/2012   Procedure: Bilateral lumbar three-four, four-five decompressive laminotomy/foraminotomy;  Surgeon: Charlie Pitter, MD;  Location: [place] NEURO ORS;  Service: Neurosurgery;  Laterality: Bilateral;  . PULSE  GENERATOR IMPLANT Bilateral 12/13/2013   Procedure: Bilateral implantable pulse generator placement;  Surgeon: Erline Levine, MD;  Location: [place] NEURO ORS;  Service: Neurosurgery;  Laterality: Bilateral;  Bilateral implantable pulse generator placement  . skin cancer removed     from ears,   12 lft arm  rt leg 15  . SUBTHALAMIC STIMULATOR BATTERY REPLACEMENT Bilateral 07/14/2017   Procedure: BILATERAL IMPLANTED PULSE GENERATOR CHANGE FOR DEEP BRAIN STIMULATOR;  Surgeon: Erline Levine, MD;  Location: [place];  Service: Neurosurgery;  Laterality: Bilateral;  . SUBTHALAMIC STIMULATOR INSERTION Bilateral 12/06/2013   Procedure: SUBTHALAMIC STIMULATOR INSERTION;  Surgeon: Erline Levine, MD;  Location: [place] NEURO ORS;  Service: Neurosurgery;  Laterality: Bilateral;  Bilateral deep brain stimulator placement    There were no vitals filed for this visit.  Subjective Assessment - 12/31/18 0927    Subjective  Patient reports his back and shoulder are hurting a little bit. Is using his new U step walker. Patient fell since last visit, walking backwards in garage.    Patient is accompained by:  Family member    Pertinent History  Patient is a pleasant 68 year old male who presents for for Parkinson's. Patient is awaiting disability hearing. He has an aide 14 hours/week and uses a walker/u step at baseline. Patient has been seen for  physical therapy at this clinic last year and was discharged to home health due to increasing fall risk and limited ability to negotiate home environment.  At time of discharge this physical therapist was advised to not encourage patient to ambulate and to use wheelchair by referring physician. Patient has since switched physicians to a neurosurgeon at [facility] who altered his deep brain stimulator with positive effects in January and re-adjusted it again about a month ago. Is now allowed to walk and encouraged to do so as frequently as possible. Patient continues to have balance deficits having  approximately 5-6 falls a month. PMH includes: Parkinson's, HTN, GERD, HLD, spinal stenosis, PTSD, Deep brain stimulator placement, cancer, and bradycardia.    Limitations  Walking;Standing;Sitting;House hold activities;Lifting    How long can you sit comfortably?  hours    How long can you stand comfortably?  3 minutes but varies    How long can you walk comfortably?  1-2 minutes    Patient Stated Goals  to stand and walk for longer distances for exercises.    Currently in Pain?  Yes    Pain Score  5     Pain Location  Back    Pain Orientation  Lower    Pain Descriptors / Indicators  Aching    Pain Type  Acute pain    Pain Onset  In the past 7 days    Pain Frequency  Intermittent    Pain Score  5    Pain Location  Shoulder    Pain Orientation  Left    Pain Descriptors / Indicators  Aching    Pain Type  Acute pain    Pain Onset  In the past 7 days           Treatment:   Speed ladder -one foot in each box for increased step length and spatial awareness 10x length of // bars -lateral side step two feet in box for increased hip flexion and abduction with spatial awareness 8x length of // bars   -Quantum leg press: drop set: 1 RM #300 lb, second drop set #255 5x, third drop set #240 8x,  Third set to fatigue at #195 ; seat position 6 holes back cueing for sequencing and slowing velocity for maximal muscle recruitment -quantum leg press: 1 RM max #300 lb      airex pad: one foot on airex pad one foot on 6" step 30 second holds x 2 trials each LE    Big step in // bars, clap then step back, use of lines for increasing BOS and length of step 10x each LE.    Standing balloon taps inside and outside BOS with upright trunk position, reaching outside BOS and able to return to center without LOB x4 minutes  Backwards ambulation in // bars 8x length with decreasing UE support from BUE support to SUE support. Cueing for widening BOS, taking larger step length, and upright posture  RTB  around ankles side stepping in // bars 4x length of // bars.   6" step ups BUE support 15x each LE, decreased to SUE support     Pt educated throughout session about proper posture and technique with exercises. Improved exercise technique, movement at target joints, use of target muscles after min to mod verbal, visual, tactile cues.                         PT Education - 12/31/18 1009  Education provided  Yes    Education Details  exercise technique, drop sets, body mechanics, 1 RM    Person(s) Educated  Patient    Methods  Explanation;Demonstration;Tactile cues;Verbal cues    Comprehension  Verbalized understanding;Returned demonstration;Verbal cues required;Tactile cues required       PT Short Term Goals - 12/17/18 0923      PT SHORT TERM GOAL #1   Title  Pt will perform HEP with family's supervision, for improved balance, transfers, and gait.      Baseline  7/7 HEP given; 8/5 pt reported that his is not performing his HEP consistently, still has his paper copy 8/31 compliant    Time  2    Period  Weeks    Status  Achieved    Target Date  12/05/18        PT Long Term Goals - 12/17/18 0924      PT LONG TERM GOAL #1   Title  Patient (> 33 years old) will complete five times sit to stand test in < 15 seconds without UE support indicating an increased LE strength and improved balance.    Baseline  7/7: 20 seconds with UE support and one posterior LOB 8/12: 15 seconds one hand on knee    Time  8    Period  Weeks    Status  Revised    Target Date  02/11/19      PT LONG TERM GOAL #2   Title  Patient will increase 10 meter walk test to >1.50ms as to improve gait speed for better community ambulation and to reduce fall risk.    Baseline  7/7: .58 m/s with U step walker 8/12: 1.0 m/s    Time  8    Period  Weeks    Status  Achieved      PT LONG TERM GOAL #3   Title  Patient will increase Berg Balance score by > 6 points (42/56) to demonstrate decreased  fall risk during functional activities.    Baseline  7/7: 25/56 8/12/: 36/56    Time  8    Period  Weeks    Status  Revised    Target Date  02/11/19      PT LONG TERM GOAL #4   Title  Patient will deny any falls over past 4 weeks to demonstrate improved safety awareness at home and in the community.    Baseline  7/7: averages 6 falls/month 8/12: 4 falls in past month    Time  8    Period  Weeks    Status  Partially Met    Target Date  02/11/19      PT LONG TERM GOAL #5   Title  Patient will increase six minute walk test distance to >1000 for progression to community ambulator and improve gait ability    Baseline  12/17/18 : 551 ft with U step walker    Time  8    Period  Weeks    Status  New    Target Date  02/11/19            Plan - 12/31/18 1011    Clinical Impression Statement  Patient fatigues quicker today with increased flexion of knees in standing.  Patient performed 1 RM max today during leg press, performing #300 lb for one time with fatigue. Patient is challenged with posterior chain activation in standing with limited stability and control with posterior ambulation. Patient will benefit from skilled physical therapy  to increase stability, strength, mobility, and posture to decrease falls risk and improve quality of life    Personal Factors and Comorbidities  Age;Comorbidity 3+;Past/Current Experience;Time since onset of injury/illness/exacerbation    Comorbidities  athritis, bradycardia, cancer, depression, dysrhythmia, GERD, HTN, Parkinson's, PTSD, SOB, sleep apnea    Examination-Activity Limitations  Bathing;Bed Mobility;Caring for Others;Bend;Dressing;Lift;Locomotion Level;Reach Overhead;Sit;Squat;Stairs;Stand;Toileting;Transfers    Examination-Participation Restrictions  Church;Cleaning;Community Activity;Interpersonal Relationship;Shop;Volunteer;Yard Work;Other    Stability/Clinical Decision Making  Unstable/Unpredictable    Rehab Potential  Fair    PT Frequency   2x / week    PT Duration  8 weeks    PT Treatment/Interventions  ADLs/Self Care Home Management;Aquatic Therapy;Cryotherapy;Electrical Stimulation;Iontophoresis '4mg'$ /ml Dexamethasone;Moist Heat;Ultrasound;Contrast Bath;DME Instruction;Gait training;Stair training;Functional mobility training;Therapeutic activities;Therapeutic exercise;Balance training;Neuromuscular re-education;Patient/family education;Manual techniques;Wheelchair mobility training;Energy conservation;Passive range of motion;Traction;Orthotic Fit/Training    PT Next Visit Plan  progress weight, stability    PT Home Exercise Plan  see above    Consulted and Agree with Plan of Care  Patient;Family member/caregiver    Family Member Consulted  wife       Patient will benefit from skilled therapeutic intervention in order to improve the following deficits and impairments:  Abnormal gait, Decreased balance, Decreased coordination, Decreased mobility, Impaired tone, Postural dysfunction, Decreased strength, Decreased safety awareness, Improper body mechanics, Impaired flexibility, Decreased activity tolerance, Decreased endurance, Decreased knowledge of precautions, Difficulty walking, Pain  Visit Diagnosis: Other abnormalities of gait and mobility  Unsteadiness on feet  Abnormal posture     Problem List Patient Active Problem List   Diagnosis Date Noted  . Rib pain on right side 09/25/2018  . Skin sore 12/01/2017  . Hematoma 10/18/2017  . Accidental fall 10/18/2017  . 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 Radicular pain in right arm 10/09/2016  . GERD (gastroesophageal reflux disease) 07/31/2016  . Trochanteric bursitis 03/03/2016  . Left hand pain 10/30/2015  . Fracture, finger, multiple sites 10/30/2015  . Colon cancer screening 07/23/2015  . Healthcare maintenance 07/23/2015  . Gout 02/18/2015  . Depression 01/06/2015  . Irritation of eyelid 08/20/2014  . Dupuytren's contracture 08/20/2014  . Cough 07/21/2014  .  Lumbar stenosis with neurogenic claudication 06/13/2014  . Preop cardiovascular exam 05/28/2014  . S/P deep brain stimulator placement 05/08/2014  . Joint pain 01/22/2014  . Medicare annual wellness visit, initial 01/19/2014  . Advance care planning 01/19/2014  . Parkinson's disease ([place]) 12/13/2013  . PTSD (post-traumatic stress disorder) 06/13/2013  . Erectile dysfunction 06/13/2013  . HLD (hyperlipidemia) 06/13/2013  . Hip pain 06/10/2013  . Pain in joint, shoulder region 06/10/2013  . Right leg swelling 06/03/2013  . Essential hypertension 06/03/2013  . Bradycardia by electrocardiogram 06/03/2013  . Obstructive sleep apnea 03/12/2013  . Spinal stenosis, lumbar region, with neurogenic claudication 12/14/2012   Janna Arch, PT, DPT   12/31/2018, 10:12 AM  [place] [address] [place], [place], [address] Phone: [phone]   Fax:  [phone]  Name: ADALBERTO METZGAR MRN: [id number] Date of Birth: 06/01/1950

## 2019-01-02 ENCOUNTER — Other Ambulatory Visit: Payer: Self-pay

## 2019-01-02 ENCOUNTER — Ambulatory Visit: Payer: Medicare PPO

## 2019-01-02 DIAGNOSIS — G20 Parkinson's disease: Secondary | ICD-10-CM | POA: Diagnosis not present

## 2019-01-02 DIAGNOSIS — R2689 Other abnormalities of gait and mobility: Secondary | ICD-10-CM

## 2019-01-02 DIAGNOSIS — R278 Other lack of coordination: Secondary | ICD-10-CM | POA: Diagnosis not present

## 2019-01-02 DIAGNOSIS — R262 Difficulty in walking, not elsewhere classified: Secondary | ICD-10-CM | POA: Diagnosis not present

## 2019-01-02 DIAGNOSIS — R2681 Unsteadiness on feet: Secondary | ICD-10-CM

## 2019-01-02 DIAGNOSIS — R293 Abnormal posture: Secondary | ICD-10-CM

## 2019-01-02 DIAGNOSIS — R296 Repeated falls: Secondary | ICD-10-CM | POA: Diagnosis not present

## 2019-01-02 DIAGNOSIS — M6281 Muscle weakness (generalized): Secondary | ICD-10-CM | POA: Diagnosis not present

## 2019-01-02 LAB — COLOGUARD: Cologuard: NEGATIVE

## 2019-01-02 NOTE — Therapy (Signed)
[place] MAIN [facility] SERVICES [address] [place], [place], [address] Phone: [phone]   Fax:  [phone]  Physical Therapy Treatment  Patient Details  Name: CARLSON BELLAND MRN: [id number] Date of Birth: 17-Jun-1950 Referring Provider (PT): Gillermo Murdoch, NP   Encounter Date: 01/02/2019  PT End of Session - 01/02/19 0950    Visit Number  18    Number of Visits  30    Date for PT Re-Evaluation  02/11/19    Authorization Type  8/10 with PN on 11/28/18    PT Start Time  0924    PT Stop Time  1010    PT Time Calculation (min)  46 min    Equipment Utilized During Treatment  Gait belt    Activity Tolerance  Patient tolerated treatment well;Patient limited by pain    Behavior During Therapy  [facility] for tasks assessed/performed       Past Medical History:  Diagnosis Date  . Arthritis   . Bradycardia   . Cancer [facility]) 2013   skin cancer  . Depression    ptsd  . Dysrhythmia    chronic slow heart rate  . GERD (gastroesophageal reflux disease)   . Headache(784.0)    tension headaches non recent  . History of chicken pox   . History of kidney stones    passed  . Hypertension    treated with HCTZ  . Parkinson's disease ([place])    dx'ed 15 years ago  . PTSD (post-traumatic stress disorder)   . Shortness of breath dyspnea   . Sleep apnea    doesn't use C-pap  . Varicose veins     Past Surgical History:  Procedure Laterality Date  . CHOLECYSTECTOMY N/A 10/22/2014   Procedure: LAPAROSCOPIC CHOLECYSTECTOMY WITH INTRAOPERATIVE CHOLANGIOGRAM;  Surgeon: Dia Crawford III, MD;  Location: [facility] ORS;  Service: General;  Laterality: N/A;  . cyst removed      from lip as a child  . LUMBAR LAMINECTOMY/DECOMPRESSION MICRODISCECTOMY Bilateral 12/14/2012   Procedure: Bilateral lumbar three-four, four-five decompressive laminotomy/foraminotomy;  Surgeon: Charlie Pitter, MD;  Location: [place] NEURO ORS;  Service: Neurosurgery;  Laterality: Bilateral;  . PULSE  GENERATOR IMPLANT Bilateral 12/13/2013   Procedure: Bilateral implantable pulse generator placement;  Surgeon: Erline Levine, MD;  Location: [place] NEURO ORS;  Service: Neurosurgery;  Laterality: Bilateral;  Bilateral implantable pulse generator placement  . skin cancer removed     from ears,   12 lft arm  rt leg 15  . SUBTHALAMIC STIMULATOR BATTERY REPLACEMENT Bilateral 07/14/2017   Procedure: BILATERAL IMPLANTED PULSE GENERATOR CHANGE FOR DEEP BRAIN STIMULATOR;  Surgeon: Erline Levine, MD;  Location: [place];  Service: Neurosurgery;  Laterality: Bilateral;  . SUBTHALAMIC STIMULATOR INSERTION Bilateral 12/06/2013   Procedure: SUBTHALAMIC STIMULATOR INSERTION;  Surgeon: Erline Levine, MD;  Location: [place] NEURO ORS;  Service: Neurosurgery;  Laterality: Bilateral;  Bilateral deep brain stimulator placement    There were no vitals filed for this visit.  Subjective Assessment - 01/02/19 0949    Subjective  Patient reports no falls since last PT session, Has been compliant with HEP.    Patient is accompained by:  Family member    Pertinent History  Patient is a pleasant 68 year old male who presents for for Parkinson's. Patient is awaiting disability hearing. He has an aide 14 hours/week and uses a walker/u step at baseline. Patient has been seen for physical therapy at this clinic last year and was discharged to home health due  to increasing fall risk and limited ability to negotiate home environment.  At time of discharge this physical therapist was advised to not encourage patient to ambulate and to use wheelchair by referring physician. Patient has since switched physicians to a neurosurgeon at [facility] who altered his deep brain stimulator with positive effects in January and re-adjusted it again about a month ago. Is now allowed to walk and encouraged to do so as frequently as possible. Patient continues to have balance deficits having approximately 5-6 falls a month. PMH includes: Parkinson's, HTN, GERD, HLD, spinal  stenosis, PTSD, Deep brain stimulator placement, cancer, and bradycardia.    Limitations  Walking;Standing;Sitting;House hold activities;Lifting    How long can you sit comfortably?  hours    How long can you stand comfortably?  3 minutes but varies    How long can you walk comfortably?  1-2 minutes    Patient Stated Goals  to stand and walk for longer distances for exercises.    Currently in Pain?  No/denies         Treatment:   4lb ankle weights seated: -LAQ : 15x, cueing for neutral alignment, velocity of recruitment. 2 sets   -single knee march 20x; one leg at a time, 2 sets   airex pad: one foot on airex pad one foot on 6" step 30 second holds x 2 trials each LE    Big step in // bars, clap then step back, use of lines for increasing BOS and length of step 10x each LE.    Standing balloon taps inside and outside BOS with upright trunk position, reaching outside BOS and able to return to center without LOB x4 minutes    Forward backwards onto tiptoe rocking with alternating arm swing.10x each side, CGA; first round without UE swing 15x, tactile cueing chest for upright posture.    Ambulate in hallway:no BOS, increased ambulation duration with increased hip flexion/knee flexion with fatigue, CGA 160 ft   Red light green light: initiation/termination of movement based on PT command with U step 2x 86 ft       static stand with weighted ball (2000 gr) 15x chest press   Static stand with weighted ball (2000 gr) 15x straight arm raise     Pt educated throughout session about proper posture and technique with exercises. Improved exercise technique, movement at target joints, use of target muscles after min to mod verbal, visual, tactile cues.                      PT Education - 01/02/19 0950    Education provided  Yes    Education Details  exercise technique, body mehcanics, ambulation without AD    Person(s) Educated  Patient    Methods   Explanation;Demonstration;Tactile cues;Verbal cues    Comprehension  Verbalized understanding;Returned demonstration;Verbal cues required;Tactile cues required       PT Short Term Goals - 12/17/18 0923      PT SHORT TERM GOAL #1   Title  Pt will perform HEP with family's supervision, for improved balance, transfers, and gait.      Baseline  7/7 HEP given; 8/5 pt reported that his is not performing his HEP consistently, still has his paper copy 8/31 compliant    Time  2    Period  Weeks    Status  Achieved    Target Date  12/05/18        PT Long Term Goals - 12/17/18 3016  PT LONG TERM GOAL #1   Title  Patient (> 38 years old) will complete five times sit to stand test in < 15 seconds without UE support indicating an increased LE strength and improved balance.    Baseline  7/7: 20 seconds with UE support and one posterior LOB 8/12: 15 seconds one hand on knee    Time  8    Period  Weeks    Status  Revised    Target Date  02/11/19      PT LONG TERM GOAL #2   Title  Patient will increase 10 meter walk test to >1.44ms as to improve gait speed for better community ambulation and to reduce fall risk.    Baseline  7/7: .58 m/s with U step walker 8/12: 1.0 m/s    Time  8    Period  Weeks    Status  Achieved      PT LONG TERM GOAL #3   Title  Patient will increase Berg Balance score by > 6 points (42/56) to demonstrate decreased fall risk during functional activities.    Baseline  7/7: 25/56 8/12/: 36/56    Time  8    Period  Weeks    Status  Revised    Target Date  02/11/19      PT LONG TERM GOAL #4   Title  Patient will deny any falls over past 4 weeks to demonstrate improved safety awareness at home and in the community.    Baseline  7/7: averages 6 falls/month 8/12: 4 falls in past month    Time  8    Period  Weeks    Status  Partially Met    Target Date  02/11/19      PT LONG TERM GOAL #5   Title  Patient will increase six minute walk test distance to >1000 for  progression to community ambulator and improve gait ability    Baseline  12/17/18 : 551 ft with U step walker    Time  8    Period  Weeks    Status  New    Target Date  02/11/19            Plan - 01/02/19 02951   Clinical Impression Statement  Patient demonstrates excessive knee flexion with prolonged ambulation without use of an AD with CGA. Max cueing for positioning for safety and posture required for ambulation without an AD. Patient's quadriceps are challenged with prolonged activation and fatigue quickly. Patient will benefit from skilled physical therapy to increase stability, strength, mobility, and posture to decrease falls risk and improve quality of life    Personal Factors and Comorbidities  Age;Comorbidity 3+;Past/Current Experience;Time since onset of injury/illness/exacerbation    Comorbidities  athritis, bradycardia, cancer, depression, dysrhythmia, GERD, HTN, Parkinson's, PTSD, SOB, sleep apnea    Examination-Activity Limitations  Bathing;Bed Mobility;Caring for Others;Bend;Dressing;Lift;Locomotion Level;Reach Overhead;Sit;Squat;Stairs;Stand;Toileting;Transfers    Examination-Participation Restrictions  Church;Cleaning;Community Activity;Interpersonal Relationship;Shop;Volunteer;Yard Work;Other    Stability/Clinical Decision Making  Unstable/Unpredictable    Rehab Potential  Fair    PT Frequency  2x / week    PT Duration  8 weeks    PT Treatment/Interventions  ADLs/Self Care Home Management;Aquatic Therapy;Cryotherapy;Electrical Stimulation;Iontophoresis '4mg'$ /ml Dexamethasone;Moist Heat;Ultrasound;Contrast Bath;DME Instruction;Gait training;Stair training;Functional mobility training;Therapeutic activities;Therapeutic exercise;Balance training;Neuromuscular re-education;Patient/family education;Manual techniques;Wheelchair mobility training;Energy conservation;Passive range of motion;Traction;Orthotic Fit/Training    PT Next Visit Plan  progress weight, stability    PT Home  Exercise Plan  see above    Consulted and Agree  with Plan of Care  Patient;Family member/caregiver    Family Member Consulted  wife       Patient will benefit from skilled therapeutic intervention in order to improve the following deficits and impairments:  Abnormal gait, Decreased balance, Decreased coordination, Decreased mobility, Impaired tone, Postural dysfunction, Decreased strength, Decreased safety awareness, Improper body mechanics, Impaired flexibility, Decreased activity tolerance, Decreased endurance, Decreased knowledge of precautions, Difficulty walking, Pain  Visit Diagnosis: Other abnormalities of gait and mobility  Unsteadiness on feet  Abnormal posture     Problem List Patient Active Problem List   Diagnosis Date Noted  . Rib pain on right side 09/25/2018  . Skin sore 12/01/2017  . Hematoma 10/18/2017  . Accidental fall 10/18/2017  . 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 Radicular pain in right arm 10/09/2016  . GERD (gastroesophageal reflux disease) 07/31/2016  . Trochanteric bursitis 03/03/2016  . Left hand pain 10/30/2015  . Fracture, finger, multiple sites 10/30/2015  . Colon cancer screening 07/23/2015  . Healthcare maintenance 07/23/2015  . Gout 02/18/2015  . Depression 01/06/2015  . Irritation of eyelid 08/20/2014  . Dupuytren's contracture 08/20/2014  . Cough 07/21/2014  . Lumbar stenosis with neurogenic claudication 06/13/2014  . Preop cardiovascular exam 05/28/2014  . S/P deep brain stimulator placement 05/08/2014  . Joint pain 01/22/2014  . Medicare annual wellness visit, initial 01/19/2014  . Advance care planning 01/19/2014  . Parkinson's disease ([place]) 12/13/2013  . PTSD (post-traumatic stress disorder) 06/13/2013  . Erectile dysfunction 06/13/2013  . HLD (hyperlipidemia) 06/13/2013  . Hip pain 06/10/2013  . Pain in joint, shoulder region 06/10/2013  . Right leg swelling 06/03/2013  . Essential hypertension 06/03/2013  . Bradycardia  by electrocardiogram 06/03/2013  . Obstructive sleep apnea 03/12/2013  . Spinal stenosis, lumbar region, with neurogenic claudication 12/14/2012   Janna Arch, PT, DPT   01/02/2019, 10:12 AM  [place] [address] [place], [place], [address] Phone: [phone]   Fax:  [phone]  Name: LANI MENDIOLA MRN: [id number] Date of Birth: [birth year]

## 2019-01-07 ENCOUNTER — Ambulatory Visit (INDEPENDENT_AMBULATORY_CARE_PROVIDER_SITE_OTHER): Payer: Medicare PPO | Admitting: Psychology

## 2019-01-07 ENCOUNTER — Ambulatory Visit: Payer: Medicare PPO

## 2019-01-07 ENCOUNTER — Other Ambulatory Visit: Payer: Self-pay

## 2019-01-07 DIAGNOSIS — R293 Abnormal posture: Secondary | ICD-10-CM | POA: Diagnosis not present

## 2019-01-07 DIAGNOSIS — R296 Repeated falls: Secondary | ICD-10-CM | POA: Diagnosis not present

## 2019-01-07 DIAGNOSIS — R2681 Unsteadiness on feet: Secondary | ICD-10-CM

## 2019-01-07 DIAGNOSIS — R262 Difficulty in walking, not elsewhere classified: Secondary | ICD-10-CM | POA: Diagnosis not present

## 2019-01-07 DIAGNOSIS — F331 Major depressive disorder, recurrent, moderate: Secondary | ICD-10-CM

## 2019-01-07 DIAGNOSIS — F4323 Adjustment disorder with mixed anxiety and depressed mood: Secondary | ICD-10-CM

## 2019-01-07 DIAGNOSIS — R2689 Other abnormalities of gait and mobility: Secondary | ICD-10-CM | POA: Diagnosis not present

## 2019-01-07 DIAGNOSIS — M6281 Muscle weakness (generalized): Secondary | ICD-10-CM | POA: Diagnosis not present

## 2019-01-07 DIAGNOSIS — R278 Other lack of coordination: Secondary | ICD-10-CM | POA: Diagnosis not present

## 2019-01-07 DIAGNOSIS — G20 Parkinson's disease: Secondary | ICD-10-CM | POA: Diagnosis not present

## 2019-01-07 NOTE — Therapy (Signed)
[place] MAIN [facility] SERVICES [address] [place], [place], [address] Phone: [phone]   Fax:  [phone]  Physical Therapy Treatment  Patient Details  Name: [name] MRN: [id number] Date of Birth: June 28, 1950 Referring Provider (PT): Gillermo Murdoch, NP   Encounter Date: 01/07/2019  PT End of Session - 01/07/19 0923    Visit Number  19    Number of Visits  30    Date for PT Re-Evaluation  02/11/19    Authorization Type  9/10 with PN on 11/28/18    PT Start Time  0925    PT Stop Time  1010    PT Time Calculation (min)  45 min    Equipment Utilized During Treatment  Gait belt    Activity Tolerance  Patient tolerated treatment well;Patient limited by pain    Behavior During Therapy  [facility] for tasks assessed/performed       Past Medical History:  Diagnosis Date  . Arthritis   . Bradycardia   . Cancer [facility]) 2013   skin cancer  . Depression    ptsd  . Dysrhythmia    chronic slow heart rate  . GERD (gastroesophageal reflux disease)   . Headache(784.0)    tension headaches non recent  . History of chicken pox   . History of kidney stones    passed  . Hypertension    treated with HCTZ  . Parkinson's disease ([place])    dx'ed 15 years ago  . PTSD (post-traumatic stress disorder)   . Shortness of breath dyspnea   . Sleep apnea    doesn't use C-pap  . Varicose veins     Past Surgical History:  Procedure Laterality Date  . CHOLECYSTECTOMY N/A 10/22/2014   Procedure: LAPAROSCOPIC CHOLECYSTECTOMY WITH INTRAOPERATIVE CHOLANGIOGRAM;  Surgeon: Dia Crawford III, MD;  Location: [facility] ORS;  Service: General;  Laterality: N/A;  . cyst removed      from lip as a child  . LUMBAR LAMINECTOMY/DECOMPRESSION MICRODISCECTOMY Bilateral 12/14/2012   Procedure: Bilateral lumbar three-four, four-five decompressive laminotomy/foraminotomy;  Surgeon: Charlie Pitter, MD;  Location: [place] NEURO ORS;  Service: Neurosurgery;  Laterality: Bilateral;  . PULSE  GENERATOR IMPLANT Bilateral 12/13/2013   Procedure: Bilateral implantable pulse generator placement;  Surgeon: Erline Levine, MD;  Location: [place] NEURO ORS;  Service: Neurosurgery;  Laterality: Bilateral;  Bilateral implantable pulse generator placement  . skin cancer removed     from ears,   12 lft arm  rt leg 15  . SUBTHALAMIC STIMULATOR BATTERY REPLACEMENT Bilateral 07/14/2017   Procedure: BILATERAL IMPLANTED PULSE GENERATOR CHANGE FOR DEEP BRAIN STIMULATOR;  Surgeon: Erline Levine, MD;  Location: [place];  Service: Neurosurgery;  Laterality: Bilateral;  . SUBTHALAMIC STIMULATOR INSERTION Bilateral 12/06/2013   Procedure: SUBTHALAMIC STIMULATOR INSERTION;  Surgeon: Erline Levine, MD;  Location: [place] NEURO ORS;  Service: Neurosurgery;  Laterality: Bilateral;  Bilateral deep brain stimulator placement    There were no vitals filed for this visit.  Subjective Assessment - 01/07/19 0927    Subjective  Patient reports he fell backwards over a bag on friday, wasn't having pain from the fall initially but woke up this morning with 8/10 low back pain. Took medication and now is at a 4/10 pain.    Patient is accompained by:  Family member    Pertinent History  Patient is a pleasant 68 year old male who presents for for Parkinson's. Patient is awaiting disability hearing. He has an aide 14 hours/week and uses a  walker/u step at baseline. Patient has been seen for physical therapy at this clinic last year and was discharged to home health due to increasing fall risk and limited ability to negotiate home environment.  At time of discharge this physical therapist was advised to not encourage patient to ambulate and to use wheelchair by referring physician. Patient has since switched physicians to a neurosurgeon at [facility] who altered his deep brain stimulator with positive effects in January and re-adjusted it again about a month ago. Is now allowed to walk and encouraged to do so as frequently as possible. Patient continues  to have balance deficits having approximately 5-6 falls a month. PMH includes: Parkinson's, HTN, GERD, HLD, spinal stenosis, PTSD, Deep brain stimulator placement, cancer, and bradycardia.    Limitations  Walking;Standing;Sitting;House hold activities;Lifting    How long can you sit comfortably?  hours    How long can you stand comfortably?  3 minutes but varies    How long can you walk comfortably?  1-2 minutes    Patient Stated Goals  to stand and walk for longer distances for exercises.    Currently in Pain?  Yes    Pain Score  4     Pain Location  Back    Pain Orientation  Lower    Pain Descriptors / Indicators  Aching    Pain Type  Acute pain    Pain Radiating Towards  mainly on R side    Pain Onset  Today    Pain Frequency  Constant    Aggravating Factors   wokek up this morning with it    Pain Relieving Factors  heat, pain meds          Treatment:    4lb ankle weights seated: -LAQ : 15x, cueing for neutral alignment, velocity of recruitment. 2 sets   -single knee march 20x; one leg at a time, 1 set, terminated second set due to pain with R low back when marching with RLE.  -IR/ER. Terminated due to R hip pain.   airex pad: one foot on airex pad one foot on 6" step 30 second holds x 2 trials each LE   Big step in // bars, clap then step back, use of lines for increasing BOS and length of step 10x each LE.   Step over and back orange hurdle 10x each LE; BUE support   Standing hip extension with ankle rocker front leg 15x each LE, BUE support  Static standing posture correction and control 60 seconds  Seated adduction squeezes 15x 3 second holds, gentle gradual increase in muscle recruitment to decrease pain  GTB hamstring curl: 15x each LE, slight pain increase with RLE  GTB df resistance 15x each LE,  Patient session limited by R hip/low back pain.  Seated hamstring stretch with overpressure dorsiflexion 60 seconds x 2 trials  Seated GTB row 15x  2" step seated  toe taps coordination for spatial awareness, muscle recruitment/timing, and coordination 30 seconds x 2 trials    Pt educated throughout session about proper posture and technique with exercises. Improved exercise technique, movement at target joints, use of target muscles after min to mod verbal, visual, tactile cues.                      PT Education - 01/07/19 [id number]    Education provided  Yes    Education Details  exercise technique, body mechanics, stability    Person(s) Educated  Patient  Methods  Explanation;Demonstration;Tactile cues;Verbal cues    Comprehension  Verbalized understanding;Returned demonstration;Verbal cues required;Tactile cues required       PT Short Term Goals - 12/17/18 0923      PT SHORT TERM GOAL #1   Title  Pt will perform HEP with family's supervision, for improved balance, transfers, and gait.      Baseline  7/7 HEP given; 8/5 pt reported that his is not performing his HEP consistently, still has his paper copy 8/31 compliant    Time  2    Period  Weeks    Status  Achieved    Target Date  12/05/18        PT Long Term Goals - 12/17/18 0924      PT LONG TERM GOAL #1   Title  Patient (> 76 years old) will complete five times sit to stand test in < 15 seconds without UE support indicating an increased LE strength and improved balance.    Baseline  7/7: 20 seconds with UE support and one posterior LOB 8/12: 15 seconds one hand on knee    Time  8    Period  Weeks    Status  Revised    Target Date  02/11/19      PT LONG TERM GOAL #2   Title  Patient will increase 10 meter walk test to >1.80ms as to improve gait speed for better community ambulation and to reduce fall risk.    Baseline  7/7: .58 m/s with U step walker 8/12: 1.0 m/s    Time  8    Period  Weeks    Status  Achieved      PT LONG TERM GOAL #3   Title  Patient will increase Berg Balance score by > 6 points (42/56) to demonstrate decreased fall risk during functional  activities.    Baseline  7/7: 25/56 8/12/: 36/56    Time  8    Period  Weeks    Status  Revised    Target Date  02/11/19      PT LONG TERM GOAL #4   Title  Patient will deny any falls over past 4 weeks to demonstrate improved safety awareness at home and in the community.    Baseline  7/7: averages 6 falls/month 8/12: 4 falls in past month    Time  8    Period  Weeks    Status  Partially Met    Target Date  02/11/19      PT LONG TERM GOAL #5   Title  Patient will increase six minute walk test distance to >1000 for progression to community ambulator and improve gait ability    Baseline  12/17/18 : 551 ft with U step walker    Time  8    Period  Weeks    Status  New    Target Date  02/11/19            Plan - 01/07/19 0943    Clinical Impression Statement  Patient requires heat on back of chair for back pain between standing interventions for reduction of symptoms.  Patient is more unstable this session with increased need for cueing and guarding. Patient demonstrates increased flat affect and limited task orientation this session indicating potential "bad day". Patient will benefit from skilled physical therapy to increase stability, strength, mobility, and posture to decrease falls risk and improve quality of life    Personal Factors and Comorbidities  Age;Comorbidity 3+;Past/Current Experience;Time since  onset of injury/illness/exacerbation    Comorbidities  athritis, bradycardia, cancer, depression, dysrhythmia, GERD, HTN, Parkinson's, PTSD, SOB, sleep apnea    Examination-Activity Limitations  Bathing;Bed Mobility;Caring for Others;Bend;Dressing;Lift;Locomotion Level;Reach Overhead;Sit;Squat;Stairs;Stand;Toileting;Transfers    Examination-Participation Restrictions  Church;Cleaning;Community Activity;Interpersonal Relationship;Shop;Volunteer;Yard Work;Other    Stability/Clinical Decision Making  Unstable/Unpredictable    Rehab Potential  Fair    PT Frequency  2x / week    PT  Duration  8 weeks    PT Treatment/Interventions  ADLs/Self Care Home Management;Aquatic Therapy;Cryotherapy;Electrical Stimulation;Iontophoresis '4mg'$ /ml Dexamethasone;Moist Heat;Ultrasound;Contrast Bath;DME Instruction;Gait training;Stair training;Functional mobility training;Therapeutic activities;Therapeutic exercise;Balance training;Neuromuscular re-education;Patient/family education;Manual techniques;Wheelchair mobility training;Energy conservation;Passive range of motion;Traction;Orthotic Fit/Training    PT Next Visit Plan  progress weight, stability    PT Home Exercise Plan  see above    Consulted and Agree with Plan of Care  Patient;Family member/caregiver    Family Member Consulted  wife       Patient will benefit from skilled therapeutic intervention in order to improve the following deficits and impairments:  Abnormal gait, Decreased balance, Decreased coordination, Decreased mobility, Impaired tone, Postural dysfunction, Decreased strength, Decreased safety awareness, Improper body mechanics, Impaired flexibility, Decreased activity tolerance, Decreased endurance, Decreased knowledge of precautions, Difficulty walking, Pain  Visit Diagnosis: Other abnormalities of gait and mobility  Unsteadiness on feet  Abnormal posture     Problem List Patient Active Problem List   Diagnosis Date Noted  . Rib pain on right side 09/25/2018  . Skin sore 12/01/2017  . Hematoma 10/18/2017  . Accidental fall 10/18/2017  . 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 Radicular pain in right arm 10/09/2016  . GERD (gastroesophageal reflux disease) 07/31/2016  . Trochanteric bursitis 03/03/2016  . Left hand pain 10/30/2015  . Fracture, finger, multiple sites 10/30/2015  . Colon cancer screening 07/23/2015  . Healthcare maintenance 07/23/2015  . Gout 02/18/2015  . Depression 01/06/2015  . Irritation of eyelid 08/20/2014  . Dupuytren's contracture 08/20/2014  . Cough 07/21/2014  . Lumbar stenosis  with neurogenic claudication 06/13/2014  . Preop cardiovascular exam 05/28/2014  . S/P deep brain stimulator placement 05/08/2014  . Joint pain 01/22/2014  . Medicare annual wellness visit, initial 01/19/2014  . Advance care planning 01/19/2014  . Parkinson's disease ([place]) 12/13/2013  . PTSD (post-traumatic stress disorder) 06/13/2013  . Erectile dysfunction 06/13/2013  . HLD (hyperlipidemia) 06/13/2013  . Hip pain 06/10/2013  . Pain in joint, shoulder region 06/10/2013  . Right leg swelling 06/03/2013  . Essential hypertension 06/03/2013  . Bradycardia by electrocardiogram 06/03/2013  . Obstructive sleep apnea 03/12/2013  . Spinal stenosis, lumbar region, with neurogenic claudication 12/14/2012    Janna Arch, PT, DPT   01/07/2019, 10:11 AM  [place] MAIN [facility] SERVICES [address] [place], [place], [address] Phone: [phone]   Fax:  [phone]  Name: [name] MRN: [id number] Date of Birth: [birth year]

## 2019-01-08 ENCOUNTER — Encounter: Payer: Self-pay | Admitting: Family Medicine

## 2019-01-09 ENCOUNTER — Other Ambulatory Visit: Payer: Self-pay

## 2019-01-09 ENCOUNTER — Ambulatory Visit: Payer: Medicare PPO

## 2019-01-09 DIAGNOSIS — G20 Parkinson's disease: Secondary | ICD-10-CM | POA: Diagnosis not present

## 2019-01-09 DIAGNOSIS — R262 Difficulty in walking, not elsewhere classified: Secondary | ICD-10-CM

## 2019-01-09 DIAGNOSIS — R2681 Unsteadiness on feet: Secondary | ICD-10-CM

## 2019-01-09 DIAGNOSIS — R293 Abnormal posture: Secondary | ICD-10-CM | POA: Diagnosis not present

## 2019-01-09 DIAGNOSIS — R296 Repeated falls: Secondary | ICD-10-CM | POA: Diagnosis not present

## 2019-01-09 DIAGNOSIS — R2689 Other abnormalities of gait and mobility: Secondary | ICD-10-CM | POA: Diagnosis not present

## 2019-01-09 DIAGNOSIS — M6281 Muscle weakness (generalized): Secondary | ICD-10-CM

## 2019-01-09 DIAGNOSIS — R278 Other lack of coordination: Secondary | ICD-10-CM | POA: Diagnosis not present

## 2019-01-09 NOTE — Therapy (Signed)
[place] MAIN [facility] SERVICES [address] [place], [place], [address] Phone: [phone]   Fax:  [phone]  Physical Therapy Progress Note  Dates of reporting period  12/03/18   to   01/09/19   Patient Details  Name: STEPAN VERRETTE MRN: [id number] Date of Birth: [birth year] Referring Provider (PT): Gillermo Murdoch, NP   Encounter Date: 01/09/2019  PT End of Session - 01/09/19 0935    Visit Number  20    Number of Visits  30    Date for PT Re-Evaluation  02/11/19    Authorization Type  10/10 with PN on 01/09/19    PT Start Time  0935    PT Stop Time  1020    PT Time Calculation (min)  45 min    Equipment Utilized During Treatment  Gait belt    Activity Tolerance  Patient tolerated treatment well;Patient limited by pain    Behavior During Therapy  WFL for tasks assessed/performed       Past Medical History:  Diagnosis Date  . Arthritis   . Bradycardia   . Cancer [facility]) 2013   skin cancer  . Depression    ptsd  . Dysrhythmia    chronic slow heart rate  . GERD (gastroesophageal reflux disease)   . Headache(784.0)    tension headaches non recent  . History of chicken pox   . History of kidney stones    passed  . Hypertension    treated with HCTZ  . Parkinson's disease ([place])    dx'ed 15 years ago  . PTSD (post-traumatic stress disorder)   . Shortness of breath dyspnea   . Sleep apnea    doesn't use C-pap  . Varicose veins     Past Surgical History:  Procedure Laterality Date  . CHOLECYSTECTOMY N/A 10/22/2014   Procedure: LAPAROSCOPIC CHOLECYSTECTOMY WITH INTRAOPERATIVE CHOLANGIOGRAM;  Surgeon: Dia Crawford III, MD;  Location: [facility] ORS;  Service: General;  Laterality: N/A;  . cyst removed      from lip as a child  . LUMBAR LAMINECTOMY/DECOMPRESSION MICRODISCECTOMY Bilateral 12/14/2012   Procedure: Bilateral lumbar three-four, four-five decompressive laminotomy/foraminotomy;  Surgeon: Charlie Pitter, MD;  Location: [place] NEURO ORS;   Service: Neurosurgery;  Laterality: Bilateral;  . PULSE GENERATOR IMPLANT Bilateral 12/13/2013   Procedure: Bilateral implantable pulse generator placement;  Surgeon: Erline Levine, MD;  Location: [place] NEURO ORS;  Service: Neurosurgery;  Laterality: Bilateral;  Bilateral implantable pulse generator placement  . skin cancer removed     from ears,   12 lft arm  rt leg 15  . SUBTHALAMIC STIMULATOR BATTERY REPLACEMENT Bilateral 07/14/2017   Procedure: BILATERAL IMPLANTED PULSE GENERATOR CHANGE FOR DEEP BRAIN STIMULATOR;  Surgeon: Erline Levine, MD;  Location: [place];  Service: Neurosurgery;  Laterality: Bilateral;  . SUBTHALAMIC STIMULATOR INSERTION Bilateral 12/06/2013   Procedure: SUBTHALAMIC STIMULATOR INSERTION;  Surgeon: Erline Levine, MD;  Location: [place] NEURO ORS;  Service: Neurosurgery;  Laterality: Bilateral;  Bilateral deep brain stimulator placement    There were no vitals filed for this visit.  Subjective Assessment - 01/09/19 0939    Subjective  Pt states that he is having some R hip and leg pain today, stating its a 4/10.  He reports some swelling in his R upper thigh.  He does have an appt on Friday with his neurologist.    Patient is accompained by:  Family member    Pertinent History  Patient is a pleasant 68 year old male who presents  for for Parkinson's. Patient is awaiting disability hearing. He has an aide 14 hours/week and uses a walker/u step at baseline. Patient has been seen for physical therapy at this clinic last year and was discharged to home health due to increasing fall risk and limited ability to negotiate home environment.  At time of discharge this physical therapist was advised to not encourage patient to ambulate and to use wheelchair by referring physician. Patient has since switched physicians to a neurosurgeon at [facility] who altered his deep brain stimulator with positive effects in January and re-adjusted it again about a month ago. Is now allowed to walk and encouraged to do  so as frequently as possible. Patient continues to have balance deficits having approximately 5-6 falls a month. PMH includes: Parkinson's, HTN, GERD, HLD, spinal stenosis, PTSD, Deep brain stimulator placement, cancer, and bradycardia.    Limitations  Walking;Standing;Sitting;House hold activities;Lifting    How long can you sit comfortably?  hours    How long can you stand comfortably?  3 minutes but varies    How long can you walk comfortably?  1-2 minutes    Patient Stated Goals  to stand and walk for longer distances for exercises.    Currently in Pain?  Yes    Pain Score  4     Pain Location  Hip    Pain Orientation  Right    Pain Descriptors / Indicators  Aching    Pain Type  Acute pain    Pain Onset  In the past 7 days    Pain Frequency  Constant          [facility] PT Assessment - 01/09/19 0938      6 Minute Walk- Baseline   6 Minute Walk- Baseline  yes    BP (mmHg)  139/60    HR (bpm)  (!) 43    02 Sat (%RA)  100 %    Modified Borg Scale for Dyspnea  0- Nothing at all    Perceived Rate of Exertion (Borg)  6-      6 Minute walk- Post Test   6 Minute Walk Post Test  yes    BP (mmHg)  139/75    HR (bpm)  61    02 Sat (%RA)  98 %    Modified Borg Scale for Dyspnea  5- Strong or hard breathing    Perceived Rate of Exertion (Borg)  11- Fairly light      6 minute walk test results    Aerobic Endurance Distance Walked  800      Berg Balance Test   Sit to Stand  Able to stand without using hands and stabilize independently    Standing Unsupported  Able to stand 2 minutes with supervision    Sitting with Back Unsupported but Feet Supported on Floor or Stool  Able to sit safely and securely 2 minutes    Stand to Sit  Sits safely with minimal use of hands    Transfers  Able to transfer safely, definite need of hands    Standing Unsupported with Eyes Closed  Able to stand 10 seconds with supervision    Standing Unsupported with Feet Together  Able to place feet together  independently and stand for 1 minute with supervision    From Standing, Reach Forward with Outstretched Arm  Can reach forward >12 cm safely (5")    From Standing Position, Pick up Object from Floor  Able to pick up shoe, needs supervision  From Standing Position, Turn to Look Behind Over each Shoulder  Looks behind from both sides and weight shifts well    Turn 360 Degrees  Able to turn 360 degrees safely one side only in 4 seconds or less    Standing Unsupported, Alternately Place Feet on Step/Stool  Able to complete 4 steps without aid or supervision    Standing Unsupported, One Foot in [place] to take small step independently and hold 30 seconds    Standing on One Leg  Able to lift leg independently and hold equal to or more than 3 seconds    Total Score  43        Treatment:     Review Goals:   6MWT with Ustep walker - 810f  5xSTS without UE support - 17.07 sec with one uncontrolled descent  BERG - 43/56  History of falls in the past 4 weeks - 4-5 falls self-reported      4lb ankle weights seated:   LAQ : 2x15, cueing for technique to complete full range of motion   Single knee march 20x on each side   Neuromuscular Re-education:  Toe taps onto 6" step while standing on airex pad 2x10 performed bilaterally  Step over and back orange hurdle 10x each LE; BUE support          Pt educated throughout session about proper posture and technique with exercises. Improved exercise technique, movement at target joints, use of target muscles after min to mod verbal, visual, tactile cues.     Pt displays excellent motivation throughout today's session.  He has met 1 STG and 2 LTGs to date, and has made progress towards 3 other LTGs.  He has improved his BERG from a 36/56 to a 43/56 today, demonstrating a decrease in fall risk from baseline.  He was able to complete the 5xSTS today without the use of his UEs in 17.07seconds with 1 instance of an uncontrolled descent.  He  improved his 6MWT from  551 ft with the U Step walker to 800 ft with the U step walker today.   He still self-endorses 4-5 falls in the past month, indicating that he remains a fall risk.  Pt will continue to benefit from skilled PT services to address balance and strength deficits in order to decrease his fall risk.               PT Short Term Goals - 01/09/19 1030      PT SHORT TERM GOAL #1   Title  Pt will perform HEP with family's supervision, for improved balance, transfers, and gait.      Baseline  7/7 HEP given; 8/5 pt reported that his is not performing his HEP consistently, still has his paper copy 8/31 compliant    Time  2    Period  Weeks    Status  Achieved    Target Date  12/05/18        PT Long Term Goals - 01/09/19 0954      PT LONG TERM GOAL #1   Title  Patient (> 680years old) will complete five times sit to stand test in < 15 seconds without UE support indicating an increased LE strength and improved balance.    Baseline  7/7: 20 seconds with UE support and one posterior LOB 8/12: 15 seconds one hand on knee; 01/09/19: 17.07 seconds without UE support, with 1 uncontrolled descent    Time  8  Period  Weeks    Status  Partially Met    Target Date  02/11/19      PT LONG TERM GOAL #2   Title  Patient will increase 10 meter walk test to >1.68ms as to improve gait speed for better community ambulation and to reduce fall risk.    Baseline  7/7: .58 m/s with U step walker 8/12: 1.0 m/s    Time  8    Period  Weeks    Status  Achieved      PT LONG TERM GOAL #3   Title  Patient will increase Berg Balance score by > 6 points (42/56) to demonstrate decreased fall risk during functional activities.    Baseline  7/7: 25/56 8/12: 36/56; 01/12/19: 43/56    Time  8    Period  Weeks    Status  Achieved      PT LONG TERM GOAL #4   Title  Patient will deny any falls over past 4 weeks to demonstrate improved safety awareness at home and in the community.    Baseline   7/7: averages 6 falls/month 8/12: 4 falls in past month; 01/09/19: 4-5 falls in past month    Time  8    Period  Weeks    Status  Partially Met    Target Date  02/11/19      PT LONG TERM GOAL #5   Title  Patient will increase six minute walk test distance to >1000 for progression to community ambulator and improve gait ability    Baseline  12/17/18 : 551 ft with U step walker; 01/09/19: 800 ft with U step walker    Time  8    Period  Weeks    Status  Partially Met    Target Date  02/11/19            Plan - 01/09/19 1039    Clinical Impression Statement  Pt displays excellent motivation throughout today's session.  He has met 1 STG and 2 LTGs to date, and has made progress towards 3 other LTGs.  He has improved his BERG from a 36/56 to a 43/56 today, demonstrating a decrease in fall risk from baseline.  He was able to complete the 5xSTS today without the use of his UEs in 17.07seconds with 1 instance of an uncontrolled descent.  He improved his 6MWT from  551 ft with the U Step walker to 800 ft with the U step walker today.   He still self-endorses 4-5 falls in the past month, indicating that he remains a fall risk.  Pt will continue to benefit from skilled PT services to address balance and strength deficits in order to decrease his fall risk.    Personal Factors and Comorbidities  Age;Comorbidity 3+;Past/Current Experience;Time since onset of injury/illness/exacerbation    Comorbidities  athritis, bradycardia, cancer, depression, dysrhythmia, GERD, HTN, Parkinson's, PTSD, SOB, sleep apnea    Examination-Activity Limitations  Bathing;Bed Mobility;Caring for Others;Bend;Dressing;Lift;Locomotion Level;Reach Overhead;Sit;Squat;Stairs;Stand;Toileting;Transfers    Examination-Participation Restrictions  Church;Cleaning;Community Activity;Interpersonal Relationship;Shop;Volunteer;Yard Work;Other    Stability/Clinical Decision Making  Unstable/Unpredictable    Rehab Potential  Fair    PT Frequency   2x / week    PT Duration  8 weeks    PT Treatment/Interventions  ADLs/Self Care Home Management;Aquatic Therapy;Cryotherapy;Electrical Stimulation;Iontophoresis '4mg'$ /ml Dexamethasone;Moist Heat;Ultrasound;Contrast Bath;DME Instruction;Gait training;Stair training;Functional mobility training;Therapeutic activities;Therapeutic exercise;Balance training;Neuromuscular re-education;Patient/family education;Manual techniques;Wheelchair mobility training;Energy conservation;Passive range of motion;Traction;Orthotic Fit/Training    PT Next Visit Plan  progress weight, stability  PT Home Exercise Plan  see above    Consulted and Agree with Plan of Care  Patient;Family member/caregiver    Family Member Consulted  wife       Patient will benefit from skilled therapeutic intervention in order to improve the following deficits and impairments:  Abnormal gait, Decreased balance, Decreased coordination, Decreased mobility, Impaired tone, Postural dysfunction, Decreased strength, Decreased safety awareness, Improper body mechanics, Impaired flexibility, Decreased activity tolerance, Decreased endurance, Decreased knowledge of precautions, Difficulty walking, Pain  Visit Diagnosis: Other abnormalities of gait and mobility  Unsteadiness on feet  Difficulty walking  Muscle weakness (generalized)     Problem List Patient Active Problem List   Diagnosis Date Noted  . Rib pain on right side 09/25/2018  . Skin sore 12/01/2017  . Hematoma 10/18/2017  . Accidental fall 10/18/2017  . 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 Radicular pain in right arm 10/09/2016  . GERD (gastroesophageal reflux disease) 07/31/2016  . Trochanteric bursitis 03/03/2016  . Left hand pain 10/30/2015  . Fracture, finger, multiple sites 10/30/2015  . Colon cancer screening 07/23/2015  . Healthcare maintenance 07/23/2015  . Gout 02/18/2015  . Depression 01/06/2015  . Irritation of eyelid 08/20/2014  . Dupuytren's contracture  08/20/2014  . Cough 07/21/2014  . Lumbar stenosis with neurogenic claudication 06/13/2014  . Preop cardiovascular exam 05/28/2014  . S/P deep brain stimulator placement 05/08/2014  . Joint pain 01/22/2014  . Medicare annual wellness visit, initial 01/19/2014  . Advance care planning 01/19/2014  . Parkinson's disease ([place]) 12/13/2013  . PTSD (post-traumatic stress disorder) 06/13/2013  . Erectile dysfunction 06/13/2013  . HLD (hyperlipidemia) 06/13/2013  . Hip pain 06/10/2013  . Pain in joint, shoulder region 06/10/2013  . Right leg swelling 06/03/2013  . Essential hypertension 06/03/2013  . Bradycardia by electrocardiogram 06/03/2013  . Obstructive sleep apnea 03/12/2013  . Spinal stenosis, lumbar region, with neurogenic claudication 12/14/2012    This entire session was performed under direct supervision and direction of a licensed therapist/therapist assistant . I have personally read, edited and approve of the note as written.   Lutricia Horsfall, SPT [facility] PT, DPT, GCS  Huprich,Jason 01/10/2019, 8:19 AM  [place] MAIN [facility] SERVICES [address] [place], [place], [address] Phone: [phone]   Fax:  [phone]  Name: ADREN DOLLINS MRN: [id number] Date of Birth: [birth year]

## 2019-01-11 DIAGNOSIS — G4752 REM sleep behavior disorder: Secondary | ICD-10-CM | POA: Diagnosis not present

## 2019-01-11 DIAGNOSIS — Z9689 Presence of other specified functional implants: Secondary | ICD-10-CM | POA: Diagnosis not present

## 2019-01-11 DIAGNOSIS — R471 Dysarthria and anarthria: Secondary | ICD-10-CM | POA: Diagnosis not present

## 2019-01-11 DIAGNOSIS — G3184 Mild cognitive impairment, so stated: Secondary | ICD-10-CM | POA: Diagnosis not present

## 2019-01-11 DIAGNOSIS — G20 Parkinson's disease: Secondary | ICD-10-CM | POA: Diagnosis not present

## 2019-01-11 DIAGNOSIS — R441 Visual hallucinations: Secondary | ICD-10-CM | POA: Diagnosis not present

## 2019-01-14 ENCOUNTER — Other Ambulatory Visit: Payer: Self-pay

## 2019-01-14 ENCOUNTER — Telehealth: Payer: Self-pay | Admitting: Cardiovascular Disease

## 2019-01-14 ENCOUNTER — Ambulatory Visit (INDEPENDENT_AMBULATORY_CARE_PROVIDER_SITE_OTHER): Payer: Medicare PPO | Admitting: Family Medicine

## 2019-01-14 ENCOUNTER — Encounter: Payer: Self-pay | Admitting: Family Medicine

## 2019-01-14 ENCOUNTER — Ambulatory Visit (INDEPENDENT_AMBULATORY_CARE_PROVIDER_SITE_OTHER)
Admission: RE | Admit: 2019-01-14 | Discharge: 2019-01-14 | Disposition: A | Discharge status: Home / Self Care | Payer: Medicare PPO | Source: Ambulatory Visit | Attending: Family Medicine | Admitting: Family Medicine

## 2019-01-14 ENCOUNTER — Ambulatory Visit: Payer: Medicare PPO

## 2019-01-14 ENCOUNTER — Telehealth: Payer: Self-pay | Admitting: Adult Health Nurse Practitioner

## 2019-01-14 VITALS — BP 122/64 | HR 67 | Temp 97.5°F | Ht 68.0 in | Wt 187.4 lb

## 2019-01-14 DIAGNOSIS — Y92009 Unspecified place in unspecified non-institutional (private) residence as the place of occurrence of the external cause: Secondary | ICD-10-CM

## 2019-01-14 DIAGNOSIS — G20 Parkinson's disease: Secondary | ICD-10-CM | POA: Diagnosis not present

## 2019-01-14 DIAGNOSIS — M6281 Muscle weakness (generalized): Secondary | ICD-10-CM | POA: Diagnosis not present

## 2019-01-14 DIAGNOSIS — R2681 Unsteadiness on feet: Secondary | ICD-10-CM

## 2019-01-14 DIAGNOSIS — R102 Pelvic and perineal pain: Secondary | ICD-10-CM

## 2019-01-14 DIAGNOSIS — W19XXXA Unspecified fall, initial encounter: Secondary | ICD-10-CM | POA: Diagnosis not present

## 2019-01-14 DIAGNOSIS — R293 Abnormal posture: Secondary | ICD-10-CM | POA: Diagnosis not present

## 2019-01-14 DIAGNOSIS — R296 Repeated falls: Secondary | ICD-10-CM | POA: Diagnosis not present

## 2019-01-14 DIAGNOSIS — R278 Other lack of coordination: Secondary | ICD-10-CM | POA: Diagnosis not present

## 2019-01-14 DIAGNOSIS — Z23 Encounter for immunization: Secondary | ICD-10-CM | POA: Diagnosis not present

## 2019-01-14 DIAGNOSIS — R262 Difficulty in walking, not elsewhere classified: Secondary | ICD-10-CM | POA: Diagnosis not present

## 2019-01-14 DIAGNOSIS — S3993XA Unspecified injury of pelvis, initial encounter: Secondary | ICD-10-CM | POA: Diagnosis not present

## 2019-01-14 DIAGNOSIS — R2689 Other abnormalities of gait and mobility: Secondary | ICD-10-CM

## 2019-01-14 NOTE — Therapy (Signed)
[place] MAIN [facility] SERVICES [address] [place], [place], [address] Phone: [phone]   Fax:  [phone]  Physical Therapy Treatment  Patient Details  Name: [name] MRN: [id number] Date of Birth: [birth year] Referring Provider (PT): Gillermo Murdoch, NP   Encounter Date: 01/14/2019  PT End of Session - 01/14/19 1034    Visit Number  21    Number of Visits  30    Date for PT Re-Evaluation  02/11/19    Authorization Type  1/10 with PN on 01/09/19    PT Start Time  0930    PT Stop Time  1014    PT Time Calculation (min)  44 min    Equipment Utilized During Treatment  Gait belt    Activity Tolerance  Patient tolerated treatment well;Patient limited by pain    Behavior During Therapy  [facility] for tasks assessed/performed       Past Medical History:  Diagnosis Date  . Arthritis   . Bradycardia   . Cancer [facility]) 2013   skin cancer  . Depression    ptsd  . Dysrhythmia    chronic slow heart rate  . GERD (gastroesophageal reflux disease)   . Headache(784.0)    tension headaches non recent  . History of chicken pox   . History of kidney stones    passed  . Hypertension    treated with HCTZ  . Parkinson's disease ([place])    dx'ed 15 years ago  . PTSD (post-traumatic stress disorder)   . Shortness of breath dyspnea   . Sleep apnea    doesn't use C-pap  . Varicose veins     Past Surgical History:  Procedure Laterality Date  . CHOLECYSTECTOMY N/A 10/22/2014   Procedure: LAPAROSCOPIC CHOLECYSTECTOMY WITH INTRAOPERATIVE CHOLANGIOGRAM;  Surgeon: Dia Crawford III, MD;  Location: [facility] ORS;  Service: General;  Laterality: N/A;  . cyst removed      from lip as a child  . LUMBAR LAMINECTOMY/DECOMPRESSION MICRODISCECTOMY Bilateral 12/14/2012   Procedure: Bilateral lumbar three-four, four-five decompressive laminotomy/foraminotomy;  Surgeon: Charlie Pitter, MD;  Location: [place] NEURO ORS;  Service: Neurosurgery;  Laterality: Bilateral;  . PULSE  GENERATOR IMPLANT Bilateral 12/13/2013   Procedure: Bilateral implantable pulse generator placement;  Surgeon: Erline Levine, MD;  Location: [place] NEURO ORS;  Service: Neurosurgery;  Laterality: Bilateral;  Bilateral implantable pulse generator placement  . skin cancer removed     from ears,   12 lft arm  rt leg 15  . SUBTHALAMIC STIMULATOR BATTERY REPLACEMENT Bilateral 07/14/2017   Procedure: BILATERAL IMPLANTED PULSE GENERATOR CHANGE FOR DEEP BRAIN STIMULATOR;  Surgeon: Erline Levine, MD;  Location: [place];  Service: Neurosurgery;  Laterality: Bilateral;  . SUBTHALAMIC STIMULATOR INSERTION Bilateral 12/06/2013   Procedure: SUBTHALAMIC STIMULATOR INSERTION;  Surgeon: Erline Levine, MD;  Location: [place] NEURO ORS;  Service: Neurosurgery;  Laterality: Bilateral;  Bilateral deep brain stimulator placement    There were no vitals filed for this visit.  Subjective Assessment - 01/14/19 1032    Subjective  Patient reports R hip pain continues to be present. Went to neuro on Friday. No falls since last session but wife reports increased trunk flexion/slump and decreased steadiness on feet.    Patient is accompained by:  Family member    Pertinent History  Patient is a pleasant 68 year old male who presents for for Parkinson's. Patient is awaiting disability hearing. He has an aide 14 hours/week and uses a walker/u step at baseline. Patient has  been seen for physical therapy at this clinic last year and was discharged to home health due to increasing fall risk and limited ability to negotiate home environment.  At time of discharge this physical therapist was advised to not encourage patient to ambulate and to use wheelchair by referring physician. Patient has since switched physicians to a neurosurgeon at [facility] who altered his deep brain stimulator with positive effects in January and re-adjusted it again about a month ago. Is now allowed to walk and encouraged to do so as frequently as possible. Patient continues to  have balance deficits having approximately 5-6 falls a month. PMH includes: Parkinson's, HTN, GERD, HLD, spinal stenosis, PTSD, Deep brain stimulator placement, cancer, and bradycardia.    Limitations  Walking;Standing;Sitting;House hold activities;Lifting    How long can you sit comfortably?  hours    How long can you stand comfortably?  3 minutes but varies    How long can you walk comfortably?  1-2 minutes    Patient Stated Goals  to stand and walk for longer distances for exercises.    Currently in Pain?  Yes    Pain Score  4     Pain Location  Hip    Pain Orientation  Right    Pain Descriptors / Indicators  Aching    Pain Type  Acute pain    Pain Onset  In the past 7 days    Pain Frequency  Constant      vitals: seated:  123/68 pulse 44 standing: 114/79 pulse 48   Speed ladder -one foot in each box for increased step length and spatial awareness 10x length of // bars -lateral side step two feet in box for increased hip flexion and abduction with spatial awareness 8x length of // bars   Static standing posture for upright positioning 60 seconds  x3 trials  4lb weight bar:  Bench press max-mod tactile cueing for alignment with max verbal cueing 15x ; 2 trials  airex pad: one foot on airex pad one foot on 6" step 30 second holds x 2 trials each LE    Side stepping in // bars ; focus on upright posture 6x length of // bars   GTB row 15x seated, mod cueing for positioning  Scarecrow posterior scap and trunk activation 15x  GTB hamstring curl 15x each LE     Pt educated throughout session about proper posture and technique with exercises. Improved exercise technique, movement at target joints, use of target muscles after min to mod verbal, visual, tactile cues.                        PT Education - 01/14/19 1033    Education provided  Yes    Education Details  call physician about BP diastolic drop, R hip pain/imaging,    Person(s) Educated   Patient;Spouse    Methods  Explanation;Demonstration;Tactile cues;Verbal cues    Comprehension  Verbalized understanding;Returned demonstration;Verbal cues required;Tactile cues required       PT Short Term Goals - 01/09/19 1030      PT SHORT TERM GOAL #1   Title  Pt will perform HEP with family's supervision, for improved balance, transfers, and gait.      Baseline  7/7 HEP given; 8/5 pt reported that his is not performing his HEP consistently, still has his paper copy 8/31 compliant    Time  2    Period  Weeks    Status  Achieved    Target Date  12/05/18        PT Long Term Goals - 01/09/19 0954      PT LONG TERM GOAL #1   Title  Patient (> 42 years old) will complete five times sit to stand test in < 15 seconds without UE support indicating an increased LE strength and improved balance.    Baseline  7/7: 20 seconds with UE support and one posterior LOB 8/12: 15 seconds one hand on knee; 01/09/19: 17.07 seconds without UE support, with 1 uncontrolled descent    Time  8    Period  Weeks    Status  Partially Met    Target Date  02/11/19      PT LONG TERM GOAL #2   Title  Patient will increase 10 meter walk test to >1.32ms as to improve gait speed for better community ambulation and to reduce fall risk.    Baseline  7/7: .58 m/s with U step walker 8/12: 1.0 m/s    Time  8    Period  Weeks    Status  Achieved      PT LONG TERM GOAL #3   Title  Patient will increase Berg Balance score by > 6 points (42/56) to demonstrate decreased fall risk during functional activities.    Baseline  7/7: 25/56 8/12: 36/56; 01/12/19: 43/56    Time  8    Period  Weeks    Status  Achieved      PT LONG TERM GOAL #4   Title  Patient will deny any falls over past 4 weeks to demonstrate improved safety awareness at home and in the community.    Baseline  7/7: averages 6 falls/month 8/12: 4 falls in past month; 01/09/19: 4-5 falls in past month    Time  8    Period  Weeks    Status  Partially Met     Target Date  02/11/19      PT LONG TERM GOAL #5   Title  Patient will increase six minute walk test distance to >1000 for progression to community ambulator and improve gait ability    Baseline  12/17/18 : 551 ft with U step walker; 01/09/19: 800 ft with U step walker    Time  8    Period  Weeks    Status  Partially Met    Target Date  02/11/19            Plan - 01/14/19 1036    Clinical Impression Statement  Patient presents with decreased coordination, excessive forward head tilt and trunk flexion, and decreased response. Patient and wife agreeable to call physician about BP response to standing, R hip pain, and new onset of unsteadiness. Patient has excessive trunk and weight shift onto LLE due to pain in R hip with sit to stands and weightbearing. Patient will benefit from skilled physical therapy to increase stability, strength, mobility, and posture to decrease falls risk and improve quality of life    Personal Factors and Comorbidities  Age;Comorbidity 3+;Past/Current Experience;Time since onset of injury/illness/exacerbation    Comorbidities  athritis, bradycardia, cancer, depression, dysrhythmia, GERD, HTN, Parkinson's, PTSD, SOB, sleep apnea    Examination-Activity Limitations  Bathing;Bed Mobility;Caring for Others;Bend;Dressing;Lift;Locomotion Level;Reach Overhead;Sit;Squat;Stairs;Stand;Toileting;Transfers    Examination-Participation Restrictions  Church;Cleaning;Community Activity;Interpersonal Relationship;Shop;Volunteer;Yard Work;Other    Stability/Clinical Decision Making  Unstable/Unpredictable    Rehab Potential  Fair    PT Frequency  2x / week    PT Duration  8 weeks    PT Treatment/Interventions  ADLs/Self Care Home Management;Aquatic Therapy;Cryotherapy;Electrical Stimulation;Iontophoresis 6m/ml Dexamethasone;Moist Heat;Ultrasound;Contrast Bath;DME Instruction;Gait training;Stair training;Functional mobility training;Therapeutic activities;Therapeutic  exercise;Balance training;Neuromuscular re-education;Patient/family education;Manual techniques;Wheelchair mobility training;Energy conservation;Passive range of motion;Traction;Orthotic Fit/Training    PT Next Visit Plan  progress weight, stability    PT Home Exercise Plan  see above    Consulted and Agree with Plan of Care  Patient;Family member/caregiver    Family Member Consulted  wife       Patient will benefit from skilled therapeutic intervention in order to improve the following deficits and impairments:  Abnormal gait, Decreased balance, Decreased coordination, Decreased mobility, Impaired tone, Postural dysfunction, Decreased strength, Decreased safety awareness, Improper body mechanics, Impaired flexibility, Decreased activity tolerance, Decreased endurance, Decreased knowledge of precautions, Difficulty walking, Pain  Visit Diagnosis: Other abnormalities of gait and mobility  Unsteadiness on feet  Muscle weakness (generalized)     Problem List Patient Active Problem List   Diagnosis Date Noted  . Rib pain on right side 09/25/2018  . Skin sore 12/01/2017  . Hematoma 10/18/2017  . Accidental fall 10/18/2017  . 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 Radicular pain in right arm 10/09/2016  . GERD (gastroesophageal reflux disease) 07/31/2016  . Trochanteric bursitis 03/03/2016  . Left hand pain 10/30/2015  . Fracture, finger, multiple sites 10/30/2015  . Colon cancer screening 07/23/2015  . Healthcare maintenance 07/23/2015  . Gout 02/18/2015  . Depression 01/06/2015  . Irritation of eyelid 08/20/2014  . Dupuytren's contracture 08/20/2014  . Cough 07/21/2014  . Lumbar stenosis with neurogenic claudication 06/13/2014  . Preop cardiovascular exam 05/28/2014  . S/P deep brain stimulator placement 05/08/2014  . Joint pain 01/22/2014  . Medicare annual wellness visit, initial 01/19/2014  . Advance care planning 01/19/2014  . Parkinson's disease ([place] 12/13/2013  . PTSD  (post-traumatic stress disorder) 06/13/2013  . Erectile dysfunction 06/13/2013  . HLD (hyperlipidemia) 06/13/2013  . Hip pain 06/10/2013  . Pain in joint, shoulder region 06/10/2013  . Right leg swelling 06/03/2013  . Essential hypertension 06/03/2013  . Bradycardia by electrocardiogram 06/03/2013  . Obstructive sleep apnea 03/12/2013  . Spinal stenosis, lumbar region, with neurogenic claudication 12/14/2012   MJanna Arch PT, DPT   01/14/2019, 10:45 AM  [place] [address]Phone: [phone]  Fax:  [phone] Name: TCHIBUEZE BEASLEYMRN: [id number] of Birth: 2November 24, 1952

## 2019-01-14 NOTE — Patient Instructions (Signed)
Go to the lab on the way out.  We'll contact you with your xray report. Aleve with food as needed for pain.  Update me as needed.  Take care.  Glad to see you.

## 2019-01-14 NOTE — Telephone Encounter (Signed)
Called and spoke with wife, [name].  Set up appointment for 01/29/2019 at 2pm via Zoom.  Sent invitation to her email.  Surabhi Gadea K. Olena Heckle NP

## 2019-01-14 NOTE — Progress Notes (Signed)
He is trying klonopin again w/o clear worsening in mood.  Cautions d/w pt. I will defer otherwise.  He agrees.  Fall at home.  Using a walker.  He tripped over a bag that was behind him.  Hip and lower back pain in the meantime.  Scraped L elbow.  Didn't hit his head on the garage floor.  Fall was 01/05/2019.   No meds tried other than aleve, with a little relief, temporary.    Meds, vitals, and allergies reviewed.   ROS: Per HPI unless specifically indicated in ROS section   nad ncat Speech at baseline rrr ctab abd soft Ext w/o edema Back not tender to palpation in the midline but he does have some tenderness near the right greater trochanteric area.  He has some discomfort crossing his right leg but does not have pain on internal rotation of the right hip.  He does lean slightly more to the left when sitting.

## 2019-01-14 NOTE — Telephone Encounter (Signed)
Attempted to call patient. LMTCB 01/14/2019

## 2019-01-14 NOTE — Telephone Encounter (Signed)
Patient returning call.

## 2019-01-14 NOTE — Telephone Encounter (Signed)
Wife calling in to make doctor aware that patient had a blood pressure concern at physical therapy today. Diastolic number went up 11 points while patient stood up at therapy. It is noted in patient chart. If this is concerning to you please advise

## 2019-01-14 NOTE — Telephone Encounter (Signed)
Returned call, he is having increasing postural dizziness over the past 6 weeks. Pt this morning encouraged him to call our office.   Sitting 123/68, HR 44 Standing 114/79, HR 48  Wife reports they cut back on Losartan pill to 0.5 tablet (25 mg total).   I moved his appt up to this Thursday 10/1 with Christell Faith, PA.   Will reach out to [facility] to make sure there is nothing to do in the meantime.   They will bring BP values to appt.   Advised pt to call for any further questions or concerns.

## 2019-01-15 ENCOUNTER — Other Ambulatory Visit: Payer: Self-pay

## 2019-01-15 ENCOUNTER — Ambulatory Visit (INDEPENDENT_AMBULATORY_CARE_PROVIDER_SITE_OTHER): Payer: Medicare PPO | Admitting: Cardiovascular Disease

## 2019-01-15 ENCOUNTER — Encounter: Payer: Self-pay | Admitting: Cardiovascular Disease

## 2019-01-15 VITALS — BP 126/84 | HR 95 | Ht 69.0 in | Wt 187.0 lb

## 2019-01-15 DIAGNOSIS — G20 Parkinson's disease: Secondary | ICD-10-CM | POA: Diagnosis not present

## 2019-01-15 DIAGNOSIS — G20A1 Parkinson's disease without dyskinesia, without mention of fluctuations: Secondary | ICD-10-CM

## 2019-01-15 DIAGNOSIS — E782 Mixed hyperlipidemia: Secondary | ICD-10-CM | POA: Diagnosis not present

## 2019-01-15 DIAGNOSIS — I471 Supraventricular tachycardia: Secondary | ICD-10-CM | POA: Diagnosis not present

## 2019-01-15 DIAGNOSIS — I10 Essential (primary) hypertension: Secondary | ICD-10-CM

## 2019-01-15 DIAGNOSIS — I4719 Other supraventricular tachycardia: Secondary | ICD-10-CM

## 2019-01-15 NOTE — Progress Notes (Signed)
Cardiology Office Note  Date:  01/15/2019   ID:  [name], DOB [birth year], MRN [id number]  PCP:  Tonia Ghent, MD   Chief Complaint  Patient presents with  . Other    patient c/o dizziness and swelling in RIGHT leg. Meds reviewed verbally with patient.     HPI:  [name] is a pleasant 68 -year-old male year-old gentleman with  Parkinson's for more than 10 years,  Hypertension, chronic bradycardia  placement of deep brain stimulator August 2015 for tremor followup at the [facility] hyperlipidemia, depression, possible seizure disorder Long-standing history of heart rate 40-50 bpm and in general has been asymptomatic who presents for routine follow-up of his blood pressure and bradycardia, hypertension.   Fell on concrete, 1 1/2 weeks ago Trauma to his left elbow, lower back Right hip discomfort Presents to the office today in a wheelchair  Weight down 5 pounds over the past year  not drinking much fluids BP low, over the past 6-week Orthostatic numbers per physical therapy  Losartan dose has been decreased, still on 25 mg daily  Continues on pravastatin,  Has general leg weakness  Gets around with a scooter  Denies any chest pain concerning for angina Chronic fatigue Napping  in the daytime  Lab work reviewed Total cho 140, LDL 76   lumbar laminectomy 12/14/2012 by Dr. Annette Stable in [place]  EKG not performed on today's visit  Other past medical history reviewed CT scan of the head 05/29/2013 showing mild chronic inflammatory disease of the sinuses. This was done for right sided headache. Work done 05/29/2013 includes basic    PMH:   has a past medical history of Arthritis, Bradycardia, Cancer ([place]) (2013), Depression, Dysrhythmia, GERD (gastroesophageal reflux disease), Headache(784.0), History of chicken pox, History of kidney stones, Hypertension, Parkinson's disease ([place]), PTSD (post-traumatic stress disorder), Shortness of breath dyspnea, Sleep  apnea, and Varicose veins.  PSH:    Past Surgical History:  Procedure Laterality Date  . CHOLECYSTECTOMY N/A 10/22/2014   Procedure: LAPAROSCOPIC CHOLECYSTECTOMY WITH INTRAOPERATIVE CHOLANGIOGRAM;  Surgeon: Dia Crawford III, MD;  Location: [facility] ORS;  Service: General;  Laterality: N/A;  . cyst removed      from lip as a child  . LUMBAR LAMINECTOMY/DECOMPRESSION MICRODISCECTOMY Bilateral 12/14/2012   Procedure: Bilateral lumbar three-four, four-five decompressive laminotomy/foraminotomy;  Surgeon: Charlie Pitter, MD;  Location: [place] NEURO ORS;  Service: Neurosurgery;  Laterality: Bilateral;  . PULSE GENERATOR IMPLANT Bilateral 12/13/2013   Procedure: Bilateral implantable pulse generator placement;  Surgeon: Erline Levine, MD;  Location: [place] NEURO ORS;  Service: Neurosurgery;  Laterality: Bilateral;  Bilateral implantable pulse generator placement  . skin cancer removed     from ears,   12 lft arm  rt leg 15  . SUBTHALAMIC STIMULATOR BATTERY REPLACEMENT Bilateral 07/14/2017   Procedure: BILATERAL IMPLANTED PULSE GENERATOR CHANGE FOR DEEP BRAIN STIMULATOR;  Surgeon: Erline Levine, MD;  Location: [place];  Service: Neurosurgery;  Laterality: Bilateral;  . SUBTHALAMIC STIMULATOR INSERTION Bilateral 12/06/2013   Procedure: SUBTHALAMIC STIMULATOR INSERTION;  Surgeon: Erline Levine, MD;  Location: [place] NEURO ORS;  Service: Neurosurgery;  Laterality: Bilateral;  Bilateral deep brain stimulator placement    Current Outpatient Medications  Medication Sig Dispense Refill  . carbidopa-levodopa (SINEMET IR) 25-100 MG tablet Take 1 tablet by mouth 3 (three) times daily.    . cholecalciferol (VITAMIN D) 1000 UNITS tablet Take 1,000 Units by mouth daily.    . clonazePAM (KLONOPIN) 0.5 MG tablet Take 0.25 mg by mouth at  bedtime.    Marland Kitchen escitalopram (LEXAPRO) 10 MG tablet Take 1 tablet (10 mg total) by mouth daily. 90 tablet 3  . memantine (NAMENDA) 10 MG tablet Take 1 tablet (10 mg total) by mouth 2 (two) times daily. 180  tablet 1  . naproxen sodium (ALEVE) 220 MG tablet Take 440 mg by mouth 2 (two) times daily as needed (for pain or headache).    Marland Kitchen omeprazole (PRILOSEC) 20 MG capsule TAKE 1 CAPSULE DAILY 90 capsule 4  . pravastatin (PRAVACHOL) 20 MG tablet TAKE 20 MG BY MOUTH EVERY EVENING 90 tablet 4  . losartan (COZAAR) 50 MG tablet Take 0.5 tablets (25 mg total) by mouth daily.     No current facility-administered medications for this visit.      Allergies:   Klonopin [clonazepam], Morphine and related, and Prednisone   Social History:  The patient  reports that he has never smoked. He has quit using smokeless tobacco. He reports current alcohol use. He reports that he does not use drugs.   Family History:   family history includes Lung cancer in his father.    Review of Systems: Review of Systems  Constitutional:       Tired, sleeping a lot at home  HENT: Negative.   Respiratory: Negative.   Cardiovascular: Negative.   Gastrointestinal: Negative.   Musculoskeletal: Positive for falls.       Unsteady gait  Neurological: Positive for tremors.  Psychiatric/Behavioral: Negative.   All other systems reviewed and are negative.    PHYSICAL EXAM: VS:  BP 126/84 (BP Location: Right Arm, Patient Position: Sitting, Cuff Size: Normal)   Pulse 95   Ht 5\' 9"  (1.753 m)   Wt 187 lb (84.8 kg)   SpO2 97%   BMI 27.62 kg/m  , BMI Body mass index is 27.62 kg/m. GEN: Well nourished, well developed, in no acute distress, slow with movements from chair to table  HEENT: normal  Neck: no JVD, carotid bruits, or masses Cardiac:Regular rhythm, bradycardic; no murmurs, rubs, or gallops,no edema  Respiratory:  clear to auscultation bilaterally, normal work of breathing GI: soft, nontender, nondistended, + BS MS: no deformity or atrophy  Skin: warm and dry, no rash Neuro:  Strength and sensation are intact Psych: euthymic mood, minimally conversant   Recent Labs: 10/04/2018: ALT 6; BUN 24; Creatinine, Ser  1.06; Hemoglobin 14.8; Platelets 187.0; Potassium 4.5; Sodium 140    Lipid Panel Lab Results  Component Value Date   CHOL 140 10/04/2018   HDL 33.60 (L) 10/04/2018   LDLCALC 76 10/04/2018   TRIG 152.0 (H) 10/04/2018    Wt Readings from Last 3 Encounters:  01/15/19 187 lb (84.8 kg)  01/14/19 187 lb 7 oz (85 kg)  10/09/18 187 lb (84.8 kg)     ASSESSMENT AND PLAN:  Mixed hyperlipidemia  Lipids well controlled Given profound leg weakness gait instability recommend he do a trial hold of his pravastatin to see if this improves his leg strength Could always restart a later date if no improvement  Essential hypertension - Blood pressure running very low Eating less, not drinking much Weight down 5 pounds, does not like to drink much fluids despite encouragement Recommend he stop his losartan, push fluids Wife who accompanies him today we will check orthostatics at home If he continues to drop despite encouraging fluids, may need trial of midodrine, Florinef  Spinal stenosis, lumbar region, with neurogenic claudication -  Chronic back pain Stable, simulator in place, S/P deep brain stimulator  placement - Tremor improved  Bradycardia Asymptomatic, not a major issue on today's visit No indication for pacemaker  Total encounter time more than 25 minutes Greater than 50% was spent in counseling and coordination of care with the patient  Disposition:   F/U  6 months   No orders of the defined types were placed in this encounter.    Signed, Esmond Plants, M.D., Ph.D. 01/15/2019  [place], [place]

## 2019-01-15 NOTE — Patient Instructions (Addendum)
Medication Instructions:  Stop the losartan Increase fluid fluids  hold the pravastatin   If you need a refill on your cardiac medications before your next appointment, please call your pharmacy.    Lab work: No new labs needed   If you have labs (blood work) drawn today and your tests are completely normal, you will receive your results only by: Marland Kitchen MyChart Message (if you have MyChart) OR . A paper copy in the mail If you have any lab test that is abnormal or we need to change your treatment, we will call you to review the results.   Testing/Procedures: No new testing needed   Follow-Up: At [facility], you and your health needs are our priority.  As part of our continuing mission to provide you with exceptional heart care, we have created designated Provider Care Teams.  These Care Teams include your primary Cardiologist (physician) and Advanced Practice Providers (APPs -  Physician Assistants and Nurse Practitioners) who all work together to provide you with the care you need, when you need it.  . You will need a follow up appointment in 6 months .   Please call our office 2 months in advance to schedule this appointment.    . Providers on your designated Care Team:   . Murray Hodgkins, NP . Christell Faith, PA-C . Marrianne Mood, PA-C  Any Other Special Instructions Will Be Listed Below (If Applicable).  For educational health videos Log in to : [email or web address] Or : [email or web address], password : [email or web address]

## 2019-01-16 ENCOUNTER — Encounter: Payer: Self-pay | Admitting: Occupational Therapy

## 2019-01-16 ENCOUNTER — Ambulatory Visit: Payer: Medicare PPO | Admitting: Occupational Therapy

## 2019-01-16 ENCOUNTER — Ambulatory Visit: Payer: Medicare PPO

## 2019-01-16 DIAGNOSIS — R262 Difficulty in walking, not elsewhere classified: Secondary | ICD-10-CM | POA: Diagnosis not present

## 2019-01-16 DIAGNOSIS — R2681 Unsteadiness on feet: Secondary | ICD-10-CM

## 2019-01-16 DIAGNOSIS — R296 Repeated falls: Secondary | ICD-10-CM | POA: Diagnosis not present

## 2019-01-16 DIAGNOSIS — R2689 Other abnormalities of gait and mobility: Secondary | ICD-10-CM | POA: Diagnosis not present

## 2019-01-16 DIAGNOSIS — R278 Other lack of coordination: Secondary | ICD-10-CM

## 2019-01-16 DIAGNOSIS — M6281 Muscle weakness (generalized): Secondary | ICD-10-CM

## 2019-01-16 DIAGNOSIS — R293 Abnormal posture: Secondary | ICD-10-CM | POA: Diagnosis not present

## 2019-01-16 DIAGNOSIS — G20 Parkinson's disease: Secondary | ICD-10-CM

## 2019-01-16 NOTE — Therapy (Signed)
[place] [facility] SERVICES [address] [place], [place], [address] Phone: [phone]   Fax:  [phone]  Occupational Therapy Treatment  Patient Details  Name: [name] MRN: [id number] Date of Birth: 12/20/1950 No data recorded  Encounter Date: 01/16/2019  OT End of Session - 01/16/19 1812    Visit Number  1    Number of Visits  24    Date for OT Re-Evaluation  04/10/19    Authorization Type  Visit 1 of 10, reporting period starting 01/16/19    OT Start Time  1010    OT Stop Time  1055    OT Time Calculation (min)  45 min    Activity Tolerance  Patient tolerated treatment well    Behavior During Therapy  [facility] for tasks assessed/performed       Past Medical History:  Diagnosis Date  . Arthritis   . Bradycardia   . Cancer [facility]) 2013   skin cancer  . Depression    ptsd  . Dysrhythmia    chronic slow heart rate  . GERD (gastroesophageal reflux disease)   . Headache(784.0)    tension headaches non recent  . History of chicken pox   . History of kidney stones    passed  . Hypertension    treated with HCTZ  . Parkinson's disease ([place])    dx'ed 15 years ago  . PTSD (post-traumatic stress disorder)   . Shortness of breath dyspnea   . Sleep apnea    doesn't use C-pap  . Varicose veins     Past Surgical History:  Procedure Laterality Date  . CHOLECYSTECTOMY N/A 10/22/2014   Procedure: LAPAROSCOPIC CHOLECYSTECTOMY WITH INTRAOPERATIVE CHOLANGIOGRAM;  Surgeon: Dia Crawford III, MD;  Location: [facility] ORS;  Service: General;  Laterality: N/A;  . cyst removed      from lip as a child  . LUMBAR LAMINECTOMY/DECOMPRESSION MICRODISCECTOMY Bilateral 12/14/2012   Procedure: Bilateral lumbar three-four, four-five decompressive laminotomy/foraminotomy;  Surgeon: Charlie Pitter, MD;  Location: [place] NEURO ORS;  Service: Neurosurgery;  Laterality: Bilateral;  . PULSE GENERATOR IMPLANT Bilateral 12/13/2013   Procedure: Bilateral implantable pulse  generator placement;  Surgeon: Erline Levine, MD;  Location: [place] NEURO ORS;  Service: Neurosurgery;  Laterality: Bilateral;  Bilateral implantable pulse generator placement  . skin cancer removed     from ears,   12 lft arm  rt leg 15  . SUBTHALAMIC STIMULATOR BATTERY REPLACEMENT Bilateral 07/14/2017   Procedure: BILATERAL IMPLANTED PULSE GENERATOR CHANGE FOR DEEP BRAIN STIMULATOR;  Surgeon: Erline Levine, MD;  Location: [place];  Service: Neurosurgery;  Laterality: Bilateral;  . SUBTHALAMIC STIMULATOR INSERTION Bilateral 12/06/2013   Procedure: SUBTHALAMIC STIMULATOR INSERTION;  Surgeon: Erline Levine, MD;  Location: [place] NEURO ORS;  Service: Neurosurgery;  Laterality: Bilateral;  Bilateral deep brain stimulator placement    There were no vitals filed for this visit.  Subjective Assessment - 01/16/19 1015    Subjective   Patient reports a recent decline.  Wife present and she reports patient had been doing really well since he had his DBS adjusted and he had a significant decline in the last 2 weeks.    Pertinent History  Pt. is a 68 y.o. mlae who has a history of Parkinson's Disease. Pt. has a Deep Brain Stimulator in place. Pt. has a history of Low Back Pain with 2 back surgeries in August 2014, and February 2016. Pt. has a history of multiple frequent falls, unsure of how many falls  in the last 6 months.    Limitations  Frequent falls, limited motor control, and [place].     Patient Stated Goals  Patient would like to be as independent as possible and be able to do yardwork again.    Currently in Pain?  Yes    Pain Score  5     Pain Location  Shoulder    Pain Orientation  Left    Pain Descriptors / Indicators  Aching    Pain Type  Chronic pain    Pain Onset  More than a month ago    Pain Frequency  Intermittent    Aggravating Factors   worse pain in shoulder when he first wakes up    Multiple Pain Sites  No         [facility] OT Assessment - 01/16/19 0001      Assessment   Medical Diagnosis   Parkinson's     Hand Dominance  Right    Prior Therapy  Yes, multiple bouts      Precautions   Precautions  Fall      Restrictions   Weight Bearing Restrictions  No      Balance Screen   Has the patient fallen in the past 6 months  Yes    Has the patient had a decrease in activity level because of a fear of falling?   Yes    Is the patient reluctant to leave their home because of a fear of falling?   --   no     Home  Environment   Family/patient expects to be discharged to:  Private residence    Living Arrangements  Spouse/significant other    Available Help at Discharge  Family    Type of [place]  One level    Alternate Level Stairs - Number of Steps  3    Bathroom Shower/Tub  Tub/Shower unit    [place]ony seat    Lives With  Spouse      Prior Function   Level of Independence  Needs assistance with ADLs;Needs assistance with homemaking    Vocation  Retired    Leisure  Previously Acupuncturist      ADL   Eating/Feeding  Needs assist with cutting food    Grooming  Minimal assistance    Upper Body Bathing  Modified independent    Lower Body Bathing  Modified independent    Upper Body Dressing  Needs assist for fasteners    Lower Body Dressing  Increased time;Minimal assistance    Toilet Transfer  Modified independent    [place] independent    Tub/Shower Transfer  Minimal assistance    ADL comments  Wife and patient report he was doing well after adjustments to DBS and was performing his self care with modified independence and was able to mow the grass on riding lawnmower in the last couple weeks.  Now requires increased assist with basic self care, homemaking and yard work.        IADL   Shopping  Completely unable to shop    Prior Level of Function Light Housekeeping  independent    Light Housekeeping  Needs help with all home  maintenance tasks    Prior Level of Function Meal Prep  independent    Meal Prep  Needs to  have meals prepared and served    [employer] on family or friends for transportation    Medication Management  Is not capable of dispensing or managing own medication    Financial Management  Dependent      Mobility   Mobility Status  History of falls;Needs assist      Written Expression   Dominant Hand  Right      Vision - History   Baseline Vision  Wears glasses all the time      Cognition   Overall Cognitive Status  Impaired/Different from baseline    Area of Impairment  Memory;Safety/judgement;Problem solving    Memory  Impaired    Problem Solving  Impaired      Sensation   Light Touch  Appears Intact    Stereognosis  Appears Intact    Hot/Cold  Appears Intact      Coordination   Gross Motor Movements are Fluid and Coordinated  No    Fine Motor Movements are Fluid and Coordinated  No    Right 9 Hole Peg Test  70 secs    Left 9 Hole Peg Test  55 secs      ROM / Strength   AROM / PROM / Strength  AROM;Strength      AROM   Overall AROM Comments  AROM shoulder flexion WNLs      Strength   Overall Strength Comments  4/5 overall shoulder and elbow strength      Hand Function   Right Hand Grip (lbs)  45    Right Hand Lateral Pinch  16 lbs    Right Hand 3 Point Pinch  11 lbs    Left Hand Grip (lbs)  55    Left Hand Lateral Pinch  16 lbs    Left 3 point pinch  15 lbs                       OT Education - 01/16/19 1810    Education provided  Yes    Education Details  goals, POC    Person(s) Educated  Patient;Spouse    Methods  Explanation    Comprehension  Verbalized understanding          OT Long Term Goals - 01/16/19 1815      OT LONG TERM GOAL #1   Title  Pt. will improve Bilateral UE strength by 1 mm grade to assist with ADLs, and IADLs    Baseline  BUE strength 4/5 overall at eval    Time  12    Period  Weeks    Status  New     Target Date  04/10/19      OT LONG TERM GOAL #2   Title  Pt. will improve grip strength by 10# to open jars and containers.    Baseline  difficulty at eval    Time  12    Period  Weeks    Status  New    Target Date  04/10/19      OT LONG TERM GOAL #3   Title  Patient will complete lower body dressing with modified independence.    Baseline  min to mod assist at eval    Time  12    Period  Weeks    Status  New    Target Date  04/10/19      OT LONG TERM GOAL #4   Title  Patient to demonstrate the ability to  vacuum flooring at home with use of modified techniques with supervision.    Baseline  unable at eval    Time  12    Period  Weeks    Status  New    Target Date  04/10/19      OT LONG TERM GOAL #5   Title  Patient will demonstrate the ability to retrieve a snack and transport it to the living room with modified independence.    Baseline  unable at eval    Time  12    Period  Weeks    Status  New    Target Date  04/10/19      OT LONG TERM GOAL #6   Title  Patient will improve hand function to be able to perform cutting of food with modified independence.    Baseline  wife helps to cut food when needed.    Time  8    Period  Weeks    Status  New    Target Date  03/13/19      OT LONG TERM GOAL #7   Title  Pt. will write a paragraph efficiently with 90% legibility.    Baseline  decreased legibility and poor hand endurance for writing more than 1 sentence.    Time  8    Period  Weeks    Status  New    Target Date  03/13/19            Plan - 01/16/19 1813    Clinical Impression Statement  Patient is a 68 year old male who was diagnosed with Parkinson's disease and currently with a deep brain stimulator implanted. He has been seen in the past for therapy and underwent recent changes and adjustments with his deep brain stimulator in January. His wife reports he had been doing well and was able to perform his basic self-care tasks with modified independence however  in the last couple weeks he has had a significant decline in function. They are in the process of trying to get his deep brain stimulator re-evaluated .  Patient was referred to OT for evaluation and presents with muscle weakness, decreased coordination, decreased balance, decreased ability to perform basic self care and IADL tasks, decreased transfers and functional mobility skills.  He would benefit from skilled OT services to maximize safety and independence in daily tasks.    Occupational performance deficits (Please refer to evaluation for details):  ADL's;IADL's    Body Structure / Function / Physical Skills  ADL;Coordination;Endurance;GMC;UE functional use;Balance;Decreased knowledge of use of DME;IADL;Pain;Dexterity;FMC;Strength;Mobility;ROM    Cognitive Skills  Attention;Problem Solve;Memory    Psychosocial Skills  Coping Strategies;Environmental  Adaptations;Habits;Routines and Behaviors    Rehab Potential  Good    Clinical Decision Making  Several treatment options, min-mod task modification necessary    Comorbidities Affecting Occupational Performance:  Presence of comorbidities impacting occupational performance    Comorbidities impacting occupational performance description:  risk for falls, slow to respond and process information at times, safety    Modification or Assistance to Complete Evaluation   Min-Moderate modification of tasks or assist with assess necessary to complete eval    OT Frequency  2x / week    OT Duration  12 weeks    OT Treatment/Interventions  Self-care/ADL training;Therapeutic exercise;DME and/or AE instruction;Energy conservation;Neuromuscular education;Patient/family education;Therapeutic activities;Moist Heat;Functional Mobility Training;Cognitive remediation/compensation    Consulted and Agree with Plan of Care  Patient       Patient will benefit from skilled therapeutic intervention  in order to improve the following deficits and impairments:   Body  Structure / Function / Physical Skills: ADL, Coordination, Endurance, GMC, UE functional use, Balance, Decreased knowledge of use of DME, IADL, Pain, Dexterity, FMC, Strength, Mobility, ROM Cognitive Skills: Attention, Problem Solve, Memory Psychosocial Skills: Coping Strategies, Environmental  Adaptations, Habits, Routines and Behaviors   Visit Diagnosis: Muscle weakness (generalized)  Other lack of coordination  Repeated falls  Parkinson's disease ([place])    Problem List Patient Active Problem List   Diagnosis Date Noted  . Rib pain on right side 09/25/2018  . Skin sore 12/01/2017  . Hematoma 10/18/2017  . Accidental fall 10/18/2017  . 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 Radicular pain in right arm 10/09/2016  . GERD (gastroesophageal reflux disease) 07/31/2016  . Trochanteric bursitis 03/03/2016  . Left hand pain 10/30/2015  . Fracture, finger, multiple sites 10/30/2015  . Colon cancer screening 07/23/2015  . Healthcare maintenance 07/23/2015  . Gout 02/18/2015  . Depression 01/06/2015  . Irritation of eyelid 08/20/2014  . Dupuytren's contracture 08/20/2014  . Cough 07/21/2014  . Lumbar stenosis with neurogenic claudication 06/13/2014  . Preop cardiovascular exam 05/28/2014  . S/P deep brain stimulator placement 05/08/2014  . Joint pain 01/22/2014  . Medicare annual wellness visit, initial 01/19/2014  . Advance care planning 01/19/2014  . Parkinson's disease ([place]) 12/13/2013  . PTSD (post-traumatic stress disorder) 06/13/2013  . Erectile dysfunction 06/13/2013  . HLD (hyperlipidemia) 06/13/2013  . Hip pain 06/10/2013  . Pain in joint, shoulder region 06/10/2013  . Right leg swelling 06/03/2013  . Essential hypertension 06/03/2013  . Bradycardia by electrocardiogram 06/03/2013  . Obstructive sleep apnea 03/12/2013  . Spinal stenosis, lumbar region, with neurogenic claudication 12/14/2012   Achilles Dunk, OTR/L, CLT  Edwena Mayorga 01/16/2019, 6:27 PM  [place] [facility] SERVICES [address] [place], [place], [address] Phone: [phone]   Fax:  [phone]  Name: [name] MRN: [id number] Date of Birth: 22-Aug-1950

## 2019-01-16 NOTE — Therapy (Signed)
[place] MAIN [facility] SERVICES [address] [place], [place], [address] Phone: [phone]   Fax:  [phone]  Physical Therapy Treatment  Patient Details  Name: [name] MRN: [id number] Date of Birth: 10-31-1950 Referring Provider (PT): Gillermo Murdoch, NP   Encounter Date: 01/16/2019  PT End of Session - 01/16/19 1246    Visit Number  22    Number of Visits  30    Date for PT Re-Evaluation  02/11/19    Authorization Type  2/10 with PN on 01/09/19    PT Start Time  0930    PT Stop Time  1010    PT Time Calculation (min)  40 min    Equipment Utilized During Treatment  Gait belt    Activity Tolerance  Patient tolerated treatment well;Patient limited by pain    Behavior During Therapy  [facility] for tasks assessed/performed       Past Medical History:  Diagnosis Date  . Arthritis   . Bradycardia   . Cancer [facility]) 2013   skin cancer  . Depression    ptsd  . Dysrhythmia    chronic slow heart rate  . GERD (gastroesophageal reflux disease)   . Headache(784.0)    tension headaches non recent  . History of chicken pox   . History of kidney stones    passed  . Hypertension    treated with HCTZ  . Parkinson's disease ([place])    dx'ed 15 years ago  . PTSD (post-traumatic stress disorder)   . Shortness of breath dyspnea   . Sleep apnea    doesn't use C-pap  . Varicose veins     Past Surgical History:  Procedure Laterality Date  . CHOLECYSTECTOMY N/A 10/22/2014   Procedure: LAPAROSCOPIC CHOLECYSTECTOMY WITH INTRAOPERATIVE CHOLANGIOGRAM;  Surgeon: Dia Crawford III, MD;  Location: [facility] ORS;  Service: General;  Laterality: N/A;  . cyst removed      from lip as a child  . LUMBAR LAMINECTOMY/DECOMPRESSION MICRODISCECTOMY Bilateral 12/14/2012   Procedure: Bilateral lumbar three-four, four-five decompressive laminotomy/foraminotomy;  Surgeon: Charlie Pitter, MD;  Location: [place] NEURO ORS;  Service: Neurosurgery;  Laterality: Bilateral;  . PULSE  GENERATOR IMPLANT Bilateral 12/13/2013   Procedure: Bilateral implantable pulse generator placement;  Surgeon: Erline Levine, MD;  Location: [place] NEURO ORS;  Service: Neurosurgery;  Laterality: Bilateral;  Bilateral implantable pulse generator placement  . skin cancer removed     from ears,   12 lft arm  rt leg 15  . SUBTHALAMIC STIMULATOR BATTERY REPLACEMENT Bilateral 07/14/2017   Procedure: BILATERAL IMPLANTED PULSE GENERATOR CHANGE FOR DEEP BRAIN STIMULATOR;  Surgeon: Erline Levine, MD;  Location: [place];  Service: Neurosurgery;  Laterality: Bilateral;  . SUBTHALAMIC STIMULATOR INSERTION Bilateral 12/06/2013   Procedure: SUBTHALAMIC STIMULATOR INSERTION;  Surgeon: Erline Levine, MD;  Location: [place] NEURO ORS;  Service: Neurosurgery;  Laterality: Bilateral;  Bilateral deep brain stimulator placement    There were no vitals filed for this visit.  Subjective Assessment - 01/16/19 0941    Subjective  Patient went to doctor yesterday, stopping taking blood pressure medication and cholesterol medicine per doctor request. Is more unsteady since last session. Did X ray to hip: normal.    Patient is accompained by:  Family member    Pertinent History  Patient is a pleasant 68 year old male who presents for for Parkinson's. Patient is awaiting disability hearing. He has an aide 14 hours/week and uses a walker/u step at baseline. Patient has been seen  for physical therapy at this clinic last year and was discharged to home health due to increasing fall risk and limited ability to negotiate home environment.  At time of discharge this physical therapist was advised to not encourage patient to ambulate and to use wheelchair by referring physician. Patient has since switched physicians to a neurosurgeon at [facility] who altered his deep brain stimulator with positive effects in January and re-adjusted it again about a month ago. Is now allowed to walk and encouraged to do so as frequently as possible. Patient continues to have  balance deficits having approximately 5-6 falls a month. PMH includes: Parkinson's, HTN, GERD, HLD, spinal stenosis, PTSD, Deep brain stimulator placement, cancer, and bradycardia.    Limitations  Walking;Standing;Sitting;House hold activities;Lifting    How long can you sit comfortably?  hours    How long can you stand comfortably?  3 minutes but varies    How long can you walk comfortably?  1-2 minutes    Patient Stated Goals  to stand and walk for longer distances for exercises.    Currently in Pain?  Yes    Pain Score  4     Pain Location  Hip    Pain Orientation  Right    Pain Descriptors / Indicators  Aching    Pain Type  Acute pain    Pain Onset  In the past 7 days    Pain Frequency  Constant         Patient went to doctor yesterday, stopping taking blood pressure medication and cholesterol medicine per doctor request. Is more unsteady since last session. Did X ray to hip: normal.   Patient and wife agreeable to go to neurologist due to rapid decline.   Vitals: Seated: 133/63 pulse 46  Standing;112/63 pulse 50   Talked to neurologist nursing staff for new appointment with DBS; explained reasoning due to patient rapid decline   Seated interventions:   Out up down up: hands on knees, hands out wide into abduction, raise hands overhead, reach forward down to ground, back up to sky then back out to abduction and back to knees x 10  Lateral shift with arm swing for seated warrior [id number] each side  side to side reach big and hold sweeping cross body then return to center upright posture modified off of LSVT BIG program for cross body coordination in seated position, more challenging to the L.  balloon taps; reaching inside and outside BOS to increase coordination, timing of muscle recruitment, and spatial awareness with core stability x 3 minutes  Seated marching with arms crossed and upright posture x 20   soccer ball taps kicking ball with focus on reaction timing,  muscle activation/coordination, and sequencing. X 3 minutes   GTB hamstring curls: 12x each LE against PT resistance  Seated modified windmills 10x each side.                PT Education - 01/16/19 1246    Education provided  Yes    Education Details  neuro appointment    Person(s) Educated  Patient;Spouse    Methods  Explanation    Comprehension  Verbalized understanding       PT Short Term Goals - 01/09/19 1030      PT SHORT TERM GOAL #1   Title  Pt will perform HEP with family's supervision, for improved balance, transfers, and gait.      Baseline  7/7 HEP given; 8/5 pt reported that his is not  performing his HEP consistently, still has his paper copy 8/31 compliant    Time  2    Period  Weeks    Status  Achieved    Target Date  12/05/18        PT Long Term Goals - 01/09/19 0954      PT LONG TERM GOAL #1   Title  Patient (> 3 years old) will complete five times sit to stand test in < 15 seconds without UE support indicating an increased LE strength and improved balance.    Baseline  7/7: 20 seconds with UE support and one posterior LOB 8/12: 15 seconds one hand on knee; 01/09/19: 17.07 seconds without UE support, with 1 uncontrolled descent    Time  8    Period  Weeks    Status  Partially Met    Target Date  02/11/19      PT LONG TERM GOAL #2   Title  Patient will increase 10 meter walk test to >1.66ms as to improve gait speed for better community ambulation and to reduce fall risk.    Baseline  7/7: .58 m/s with U step walker 8/12: 1.0 m/s    Time  8    Period  Weeks    Status  Achieved      PT LONG TERM GOAL #3   Title  Patient will increase Berg Balance score by > 6 points (42/56) to demonstrate decreased fall risk during functional activities.    Baseline  7/7: 25/56 8/12: 36/56; 01/12/19: 43/56    Time  8    Period  Weeks    Status  Achieved      PT LONG TERM GOAL #4   Title  Patient will deny any falls over past 4 weeks to demonstrate  improved safety awareness at home and in the community.    Baseline  7/7: averages 6 falls/month 8/12: 4 falls in past month; 01/09/19: 4-5 falls in past month    Time  8    Period  Weeks    Status  Partially Met    Target Date  02/11/19      PT LONG TERM GOAL #5   Title  Patient will increase six minute walk test distance to >1000 for progression to community ambulator and improve gait ability    Baseline  12/17/18 : 551 ft with U step walker; 01/09/19: 800 ft with U step walker    Time  8    Period  Weeks    Status  Partially Met    Target Date  02/11/19            Plan - 01/16/19 1251    Clinical Impression Statement  Patient presents with noticeable decline since last seen two days prior, wife agrees with noted decline stating patient has been increasingly unsteady and increasingly higher fall risk. Patient's neurologist called and patient pending visit for deep brain stimulator tune up.Patient will benefit from skilled physical therapy to increase stability, strength, mobility, and posture to decrease falls risk and improve quality of life    Personal Factors and Comorbidities  Age;Comorbidity 3+;Past/Current Experience;Time since onset of injury/illness/exacerbation    Comorbidities  athritis, bradycardia, cancer, depression, dysrhythmia, GERD, HTN, Parkinson's, PTSD, SOB, sleep apnea    Examination-Activity Limitations  Bathing;Bed Mobility;Caring for Others;Bend;Dressing;Lift;Locomotion Level;Reach Overhead;Sit;Squat;Stairs;Stand;Toileting;Transfers    Examination-Participation Restrictions  Church;Cleaning;Community Activity;Interpersonal Relationship;Shop;Volunteer;Yard Work;Other    Stability/Clinical Decision Making  Unstable/Unpredictable    Rehab Potential  Fair    PT  Frequency  2x / week    PT Duration  8 weeks    PT Treatment/Interventions  ADLs/Self Care Home Management;Aquatic Therapy;Cryotherapy;Electrical Stimulation;Iontophoresis '4mg'$ /ml Dexamethasone;Moist  Heat;Ultrasound;Contrast Bath;DME Instruction;Gait training;Stair training;Functional mobility training;Therapeutic activities;Therapeutic exercise;Balance training;Neuromuscular re-education;Patient/family education;Manual techniques;Wheelchair mobility training;Energy conservation;Passive range of motion;Traction;Orthotic Fit/Training    PT Next Visit Plan  progress weight, stability    PT Home Exercise Plan  see above    Consulted and Agree with Plan of Care  Patient;Family member/caregiver    Family Member Consulted  wife       Patient will benefit from skilled therapeutic intervention in order to improve the following deficits and impairments:  Abnormal gait, Decreased balance, Decreased coordination, Decreased mobility, Impaired tone, Postural dysfunction, Decreased strength, Decreased safety awareness, Improper body mechanics, Impaired flexibility, Decreased activity tolerance, Decreased endurance, Decreased knowledge of precautions, Difficulty walking, Pain  Visit Diagnosis: Other abnormalities of gait and mobility  Unsteadiness on feet  Muscle weakness (generalized)     Problem List Patient Active Problem List   Diagnosis Date Noted  . Rib pain on right side 09/25/2018  . Skin sore 12/01/2017  . Hematoma 10/18/2017  . Accidental fall 10/18/2017  . 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 Radicular pain in right arm 10/09/2016  . GERD (gastroesophageal reflux disease) 07/31/2016  . Trochanteric bursitis 03/03/2016  . Left hand pain 10/30/2015  . Fracture, finger, multiple sites 10/30/2015  . Colon cancer screening 07/23/2015  . Healthcare maintenance 07/23/2015  . Gout 02/18/2015  . Depression 01/06/2015  . Irritation of eyelid 08/20/2014  . Dupuytren's contracture 08/20/2014  . Cough 07/21/2014  . Lumbar stenosis with neurogenic claudication 06/13/2014  . Preop cardiovascular exam 05/28/2014  . S/P deep brain stimulator placement 05/08/2014  . Joint pain 01/22/2014   . Medicare annual wellness visit, initial 01/19/2014  . Advance care planning 01/19/2014  . Parkinson's disease ([place]) 12/13/2013  . PTSD (post-traumatic stress disorder) 06/13/2013  . Erectile dysfunction 06/13/2013  . HLD (hyperlipidemia) 06/13/2013  . Hip pain 06/10/2013  . Pain in joint, shoulder region 06/10/2013  . Right leg swelling 06/03/2013  . Essential hypertension 06/03/2013  . Bradycardia by electrocardiogram 06/03/2013  . Obstructive sleep apnea 03/12/2013  . Spinal stenosis, lumbar region, with neurogenic claudication 12/14/2012   Janna Arch, PT, DPT   01/16/2019, 12:52 PM  [place] MAIN [facility] SERVICES [address] [place], [place], [address] Phone: [phone]   Fax:  [phone]  Name: WILLIAMSON CAVANAH MRN: [id number] Date of Birth: 1950/07/24

## 2019-01-16 NOTE — Assessment & Plan Note (Signed)
I do not suspect a fracture but reasonable to check plain films given his history.  Use Aleve in the meantime.  Routine cautions given.  Use ice as needed.  Update me as needed.  He agrees.

## 2019-01-17 ENCOUNTER — Ambulatory Visit: Payer: Medicare PPO | Admitting: Physician Assistant

## 2019-01-21 ENCOUNTER — Ambulatory Visit (INDEPENDENT_AMBULATORY_CARE_PROVIDER_SITE_OTHER): Payer: Medicare PPO | Admitting: Psychology

## 2019-01-21 ENCOUNTER — Ambulatory Visit: Payer: Medicare PPO | Admitting: Occupational Therapy

## 2019-01-21 ENCOUNTER — Ambulatory Visit: Payer: Medicare PPO

## 2019-01-21 DIAGNOSIS — F4323 Adjustment disorder with mixed anxiety and depressed mood: Secondary | ICD-10-CM | POA: Diagnosis not present

## 2019-01-21 DIAGNOSIS — F331 Major depressive disorder, recurrent, moderate: Secondary | ICD-10-CM | POA: Diagnosis not present

## 2019-01-23 ENCOUNTER — Ambulatory Visit: Payer: Medicare PPO

## 2019-01-23 ENCOUNTER — Encounter: Payer: Medicare PPO | Admitting: Occupational Therapy

## 2019-01-25 ENCOUNTER — Encounter: Payer: Medicare PPO | Admitting: Occupational Therapy

## 2019-01-25 ENCOUNTER — Ambulatory Visit: Payer: Medicare PPO

## 2019-01-28 ENCOUNTER — Ambulatory Visit: Payer: Medicare PPO

## 2019-01-28 ENCOUNTER — Ambulatory Visit: Payer: Medicare PPO | Admitting: Occupational Therapy

## 2019-01-29 ENCOUNTER — Other Ambulatory Visit: Payer: TRICARE For Life (TFL) | Admitting: Adult Health Nurse Practitioner

## 2019-01-29 ENCOUNTER — Other Ambulatory Visit: Payer: Self-pay

## 2019-01-29 DIAGNOSIS — Z515 Encounter for palliative care: Secondary | ICD-10-CM

## 2019-01-29 NOTE — Progress Notes (Signed)
[place] Consult Note Telephone: [phone]  Fax: [phone]  PATIENT NAME: [name] DOB: 1950-04-24 MRN: [id number]  PRIMARY CARE PROVIDER:   Tonia Ghent, MD  REFERRING PROVIDER:  Tonia Ghent, MD [place],  [place] [address]  RESPONSIBLE PARTY:   [name], wife [phone]  Due to the COVID-19 crisis, this visit was done via telemedicine and it was initiated and consent by this patient and or family.    RECOMMENDATIONS and PLAN:  1.  Advanced care planning.  Patient is full code. Did not get to talk with wife and patient about this today.  Connection got disconnected prior to finishing conversation and provider has another scheduled appointment.  Did email wife with contact info and encouraged to call with any questions or concerns.  Will call back in one month to schedule next appointment.  2. Parkinson's disease.  Patient was diagnosed about 20 years ago.  He had a deep brain stimulator implanted in 2015.  Wife states that January this year he started going to Dr. Tillman Sers, neurology.  States that she has adjusted his DBS and is now walking instead of using a scooter to get around. States he uses a walker when he needs it but walks without it most of the time.  Also states that his speech and swallowing have improved.  Wife still has to give some assistance with ADLs but less than before.  Does state that he has occasional diarrhea that he cannot get to the bathroom in time, so he wears adult pull ups.  She does state that with him walking instead of using the scooter that his falls has increased.  Has bee a week and a half since his last fall.  States that he has only had scratches and bruises from his falls and have not had to go to the hospital for any major injury.  States he tucks and rolls to the left when he falls as he was trained to do in the [employer].  Continue follow up and  recommendations by neurology.    3.  Appetite.  His appetite is unchanged per wife.  He has days where he eats more than other days.  Weight as of 01/15/2019 is 187 and BMI  27.62.  Monitor for any changes in appetite and weight  4.  Depression.  He is currently on Lexapro and namenda.  Wife states that he also sees a therapist.  He does have a history of PTSD.  Does get depressed when told that he can't do things he used to such as driving.  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  Currently depression seems well managed and does not express any SI.  Continue current meds and therapy and monitor for any worsening symptoms that would warrant medication adjustment  5.  Caregiver strain.  Wife states that she also takes care of her mother.  Wife is disabled herself and can only do so much when it comes to lifting.  Does have a caregiver who comes in 14 hours a week through [employer].  Wife states that she does sell [employer] products from home as a distraction.  States that she helps lead a caregiver support group at her church.     I spent 60 minutes providing this consultation,  from  2:00 to 3:00. More than 50% of the time in this consultation was spent coordinating communication.   HISTORY OF PRESENT ILLNESS:  [name] is a 68 y.o. year old male with multiple medical problems including Parkinson's disease, HTN,PTSD, sleep apnea. Palliative Care was asked to help address goals of care.   CODE STATUS: full code  PPS: 50% HOSPICE ELIGIBILITY/DIAGNOSIS: TBD  PAST MEDICAL HISTORY:  Past Medical History:  Diagnosis Date   Arthritis    Bradycardia    Cancer ([place]) 2013   skin cancer   Depression    ptsd   Dysrhythmia    chronic slow heart rate   GERD (gastroesophageal reflux disease)    Headache(784.0)    tension headaches non recent   History of chicken pox     History of kidney stones    passed   Hypertension    treated with HCTZ   Parkinson's disease ([place])    dx'ed 15 years ago   PTSD (post-traumatic stress disorder)    Shortness of breath dyspnea    Sleep apnea    doesn't use C-pap   Varicose veins     SOCIAL HX:  Social History   Tobacco Use   Smoking status: Never Smoker   Smokeless tobacco: Former Systems developer  Substance Use Topics   Alcohol use: Yes    Alcohol/week: 0.0 standard drinks    Comment: occasional (twice per month)    ALLERGIES:  Allergies  Allergen Reactions   Klonopin [Clonazepam] Other (See Comments)    Worsening mood/depression   Morphine And Related Other (See Comments)    hallucinations   Prednisone Other (See Comments)    Unclear reaction     PERTINENT MEDICATIONS:  Outpatient Encounter Medications as of 69/13/2020  Medication Sig   carbidopa-levodopa (SINEMET IR) 25-100 MG tablet Take 1 tablet by mouth 3 (three) times daily.   cholecalciferol (VITAMIN D) 1000 UNITS tablet Take 1,000 Units by mouth daily.   clonazePAM (KLONOPIN) 0.5 MG tablet Take 0.25 mg by mouth at bedtime.   escitalopram (LEXAPRO) 10 MG tablet Take 1 tablet (10 mg total) by mouth daily.   memantine (NAMENDA) 10 MG tablet Take 1 tablet (10 mg total) by mouth 2 (two) times daily.   naproxen sodium (ALEVE) 220 MG tablet Take 440 mg by mouth 2 (two) times daily as needed (for pain or headache).   omeprazole (PRILOSEC) 20 MG capsule TAKE 1 CAPSULE DAILY   No facility-administered encounter medications on file as of 69/13/2020.      Arryn Terrones Jenetta Downer, NP

## 2019-01-30 ENCOUNTER — Encounter: Payer: Self-pay | Admitting: Occupational Therapy

## 2019-01-30 ENCOUNTER — Ambulatory Visit: Payer: Medicare PPO | Admitting: Occupational Therapy

## 2019-01-30 ENCOUNTER — Other Ambulatory Visit: Payer: Self-pay

## 2019-01-30 ENCOUNTER — Ambulatory Visit: Payer: Medicare PPO | Attending: Physician Assistant

## 2019-01-30 DIAGNOSIS — R278 Other lack of coordination: Secondary | ICD-10-CM

## 2019-01-30 DIAGNOSIS — R2681 Unsteadiness on feet: Secondary | ICD-10-CM

## 2019-01-30 DIAGNOSIS — R296 Repeated falls: Secondary | ICD-10-CM

## 2019-01-30 DIAGNOSIS — G20A1 Parkinson's disease without dyskinesia, without mention of fluctuations: Secondary | ICD-10-CM

## 2019-01-30 DIAGNOSIS — R262 Difficulty in walking, not elsewhere classified: Secondary | ICD-10-CM | POA: Insufficient documentation

## 2019-01-30 DIAGNOSIS — M6281 Muscle weakness (generalized): Secondary | ICD-10-CM | POA: Diagnosis not present

## 2019-01-30 DIAGNOSIS — R2689 Other abnormalities of gait and mobility: Secondary | ICD-10-CM | POA: Diagnosis not present

## 2019-01-30 DIAGNOSIS — G20 Parkinson's disease: Secondary | ICD-10-CM | POA: Diagnosis not present

## 2019-01-30 DIAGNOSIS — R293 Abnormal posture: Secondary | ICD-10-CM | POA: Insufficient documentation

## 2019-01-30 NOTE — Therapy (Signed)
[place] MAIN [facility] SERVICES [address] [place], [place], [address] Phone: [phone]   Fax:  [phone]  Physical Therapy Treatment  Patient Details  Name: [name] MRN: [id number] Date of Birth: 09-20-1950 Referring Provider (PT): Gillermo Murdoch, NP   Encounter Date: 01/30/2019  PT End of Session - 01/30/19 1340    Visit Number  23    Number of Visits  30    Date for PT Re-Evaluation  02/11/19    Authorization Type  3/10 with PN on 01/09/19    PT Start Time  1345    PT Stop Time  1429    PT Time Calculation (min)  44 min    Equipment Utilized During Treatment  Gait belt    Activity Tolerance  Patient tolerated treatment well    Behavior During Therapy  [facility] for tasks assessed/performed       Past Medical History:  Diagnosis Date  . Arthritis   . Bradycardia   . Cancer [facility]) 2013   skin cancer  . Depression    ptsd  . Dysrhythmia    chronic slow heart rate  . GERD (gastroesophageal reflux disease)   . Headache(784.0)    tension headaches non recent  . History of chicken pox   . History of kidney stones    passed  . Hypertension    treated with HCTZ  . Parkinson's disease ([place])    dx'ed 15 years ago  . PTSD (post-traumatic stress disorder)   . Shortness of breath dyspnea   . Sleep apnea    doesn't use C-pap  . Varicose veins     Past Surgical History:  Procedure Laterality Date  . CHOLECYSTECTOMY N/A 10/22/2014   Procedure: LAPAROSCOPIC CHOLECYSTECTOMY WITH INTRAOPERATIVE CHOLANGIOGRAM;  Surgeon: Dia Crawford III, MD;  Location: [facility] ORS;  Service: General;  Laterality: N/A;  . cyst removed      from lip as a child  . LUMBAR LAMINECTOMY/DECOMPRESSION MICRODISCECTOMY Bilateral 12/14/2012   Procedure: Bilateral lumbar three-four, four-five decompressive laminotomy/foraminotomy;  Surgeon: Charlie Pitter, MD;  Location: [place] NEURO ORS;  Service: Neurosurgery;  Laterality: Bilateral;  . PULSE GENERATOR IMPLANT  Bilateral 12/13/2013   Procedure: Bilateral implantable pulse generator placement;  Surgeon: Erline Levine, MD;  Location: [place] NEURO ORS;  Service: Neurosurgery;  Laterality: Bilateral;  Bilateral implantable pulse generator placement  . skin cancer removed     from ears,   12 lft arm  rt leg 15  . SUBTHALAMIC STIMULATOR BATTERY REPLACEMENT Bilateral 07/14/2017   Procedure: BILATERAL IMPLANTED PULSE GENERATOR CHANGE FOR DEEP BRAIN STIMULATOR;  Surgeon: Erline Levine, MD;  Location: [place];  Service: Neurosurgery;  Laterality: Bilateral;  . SUBTHALAMIC STIMULATOR INSERTION Bilateral 12/06/2013   Procedure: SUBTHALAMIC STIMULATOR INSERTION;  Surgeon: Erline Levine, MD;  Location: [place] NEURO ORS;  Service: Neurosurgery;  Laterality: Bilateral;  Bilateral deep brain stimulator placement    There were no vitals filed for this visit.  Subjective Assessment - 01/30/19 1348    Subjective  Patient had a home televisit with nursing yesterday. Has been taken off one medication which has been helping him.    Patient is accompained by:  Family member    Pertinent History  Patient is a pleasant 68 year old male who presents for for Parkinson's. Patient is awaiting disability hearing. He has an aide 14 hours/week and uses a walker/u step at baseline. Patient has been seen for physical therapy at this clinic last year and was discharged to  home health due to increasing fall risk and limited ability to negotiate home environment.  At time of discharge this physical therapist was advised to not encourage patient to ambulate and to use wheelchair by referring physician. Patient has since switched physicians to a neurosurgeon at [facility] who altered his deep brain stimulator with positive effects in January and re-adjusted it again about a month ago. Is now allowed to walk and encouraged to do so as frequently as possible. Patient continues to have balance deficits having approximately 5-6 falls a month. PMH includes: Parkinson's,  HTN, GERD, HLD, spinal stenosis, PTSD, Deep brain stimulator placement, cancer, and bradycardia.    Limitations  Walking;Standing;Sitting;House hold activities;Lifting    How long can you sit comfortably?  hours    How long can you stand comfortably?  3 minutes but varies    How long can you walk comfortably?  1-2 minutes    Patient Stated Goals  to stand and walk for longer distances for exercises.    Currently in Pain?  Yes    Pain Score  5     Pain Location  Back    Pain Orientation  Lower    Pain Descriptors / Indicators  Aching    Pain Type  Chronic pain    Pain Onset  In the past 7 days    Pain Frequency  Intermittent       Nustep Level 3 RPM> 60 for cardiovascular challenge. 3 minutes   airex pad: modified tandem stance 2x 30 seconds each direction   Speed ladder -one foot in each box for increased step length and spatial awareness4x length of // bars -lateral side step two feet in box for increased hip flexion and abduction with spatial awareness4x length of // bars  Static standing posture for upright positioning 60 seconds  x3 trials  Step over and back orange hurdle 10x each LE  Diagonal walking with U step: GTB around patient to walker to maintain good distance within walker; max cueing for body mechanics, sequencing initially, reduced to CGA and min cueing 2x 86 ft  airex pad: one foot on airex pad one foot on 6" step 30 second holds x 2 trials each LE   Seated hamstring stretch on 4" step 30 seconds each LE  Pt educated throughout session about proper posture and technique with exercises. Improved exercise technique, movement at target joints, use of target muscles after min to mod verbal, visual, tactile cues.   Noticeable L lean throughout session    Patient presents with L trunk lean throughout session. He demonstrated understanding of interventions based on safety with turning with U step walker. Patient is challenged with dual tasking and  maintaining upright posture requiring frequent cueing. Patient will benefit from skilled physical therapy to increase stability, strength, mobility, and posture to decrease falls risk and improve quality of life                 PT Education - 01/30/19 1339    Education provided  Yes    Education Details  exercise technique, body mechanics    Person(s) Educated  Patient    Methods  Explanation;Demonstration;Tactile cues;Verbal cues    Comprehension  Verbalized understanding;Returned demonstration;Verbal cues required;Tactile cues required       PT Short Term Goals - 01/09/19 1030      PT SHORT TERM GOAL #1   Title  Pt will perform HEP with family's supervision, for improved balance, transfers, and gait.  Baseline  7/7 HEP given; 8/5 pt reported that his is not performing his HEP consistently, still has his paper copy 8/31 compliant    Time  2    Period  Weeks    Status  Achieved    Target Date  12/05/18        PT Long Term Goals - 01/09/19 0954      PT LONG TERM GOAL #1   Title  Patient (> 76 years old) will complete five times sit to stand test in < 15 seconds without UE support indicating an increased LE strength and improved balance.    Baseline  7/7: 20 seconds with UE support and one posterior LOB 8/12: 15 seconds one hand on knee; 01/09/19: 17.07 seconds without UE support, with 1 uncontrolled descent    Time  8    Period  Weeks    Status  Partially Met    Target Date  02/11/19      PT LONG TERM GOAL #2   Title  Patient will increase 10 meter walk test to >1.58ms as to improve gait speed for better community ambulation and to reduce fall risk.    Baseline  7/7: .58 m/s with U step walker 8/12: 1.0 m/s    Time  8    Period  Weeks    Status  Achieved      PT LONG TERM GOAL #3   Title  Patient will increase Berg Balance score by > 6 points (42/56) to demonstrate decreased fall risk during functional activities.    Baseline  7/7: 25/56 8/12: 36/56;  01/12/19: 43/56    Time  8    Period  Weeks    Status  Achieved      PT LONG TERM GOAL #4   Title  Patient will deny any falls over past 4 weeks to demonstrate improved safety awareness at home and in the community.    Baseline  7/7: averages 6 falls/month 8/12: 4 falls in past month; 01/09/19: 4-5 falls in past month    Time  8    Period  Weeks    Status  Partially Met    Target Date  02/11/19      PT LONG TERM GOAL #5   Title  Patient will increase six minute walk test distance to >1000 for progression to community ambulator and improve gait ability    Baseline  12/17/18 : 551 ft with U step walker; 01/09/19: 800 ft with U step walker    Time  8    Period  Weeks    Status  Partially Met    Target Date  02/11/19            Plan - 01/30/19 1516    Clinical Impression Statement  Patient presents with L trunk lean throughout session. He demonstrated understanding of interventions based on safety with turning with U step walker. Patient is challenged with dual tasking and maintaining upright posture requiring frequent cueing. Patient will benefit from skilled physical therapy to increase stability, strength, mobility, and posture to decrease falls risk and improve quality of life    Personal Factors and Comorbidities  Age;Comorbidity 3+;Past/Current Experience;Time since onset of injury/illness/exacerbation    Comorbidities  athritis, bradycardia, cancer, depression, dysrhythmia, GERD, HTN, Parkinson's, PTSD, SOB, sleep apnea    Examination-Activity Limitations  Bathing;Bed Mobility;Caring for Others;Bend;Dressing;Lift;Locomotion Level;Reach Overhead;Sit;Squat;Stairs;Stand;Toileting;Transfers    Examination-Participation Restrictions  Church;Cleaning;Community Activity;Interpersonal Relationship;Shop;Volunteer;Yard Work;Other    Stability/Clinical Decision Making  Unstable/Unpredictable  Rehab Potential  Fair    PT Frequency  2x / week    PT Duration  8 weeks    PT  Treatment/Interventions  ADLs/Self Care Home Management;Aquatic Therapy;Cryotherapy;Electrical Stimulation;Iontophoresis 58m/ml Dexamethasone;Moist Heat;Ultrasound;Contrast Bath;DME Instruction;Gait training;Stair training;Functional mobility training;Therapeutic activities;Therapeutic exercise;Balance training;Neuromuscular re-education;Patient/family education;Manual techniques;Wheelchair mobility training;Energy conservation;Passive range of motion;Traction;Orthotic Fit/Training    PT Next Visit Plan  progress weight, stability    PT Home Exercise Plan  see above    Consulted and Agree with Plan of Care  Patient;Family member/caregiver    Family Member Consulted  wife       Patient will benefit from skilled therapeutic intervention in order to improve the following deficits and impairments:  Abnormal gait, Decreased balance, Decreased coordination, Decreased mobility, Impaired tone, Postural dysfunction, Decreased strength, Decreased safety awareness, Improper body mechanics, Impaired flexibility, Decreased activity tolerance, Decreased endurance, Decreased knowledge of precautions, Difficulty walking, Pain  Visit Diagnosis: Muscle weakness (generalized)  Repeated falls  Parkinson's disease ([place]  Other abnormalities of gait and mobility  Unsteadiness on feet     Problem List Patient Active Problem List   Diagnosis Date Noted  . Rib pain on right side 09/25/2018  . Skin sore 12/01/2017  . Hematoma 10/18/2017  . Fall at home 10/18/2017  . 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 Radicular pain in right arm 10/09/2016  . GERD (gastroesophageal reflux disease) 07/31/2016  . Trochanteric bursitis 03/03/2016  . Left hand pain 10/30/2015  . Fracture, finger, multiple sites 10/30/2015  . Colon cancer screening 07/23/2015  . Healthcare maintenance 07/23/2015  . Gout 02/18/2015  . Depression 01/06/2015  . Irritation of eyelid 08/20/2014  . Dupuytren's contracture 08/20/2014  . Cough  07/21/2014  . Lumbar stenosis with neurogenic claudication 06/13/2014  . Preop cardiovascular exam 05/28/2014  . S/P deep brain stimulator placement 05/08/2014  . Joint pain 01/22/2014  . Medicare annual wellness visit, initial 01/19/2014  . Advance care planning 01/19/2014  . [place]'s disease ([place] 12/13/2013  . PTSD (post-traumatic stress disorder) 06/13/2013  . Erectile dysfunction 06/13/2013  . HLD (hyperlipidemia) 06/13/2013  . Hip pain 06/10/2013  . Pain in joint, shoulder region 06/10/2013  . Right leg swelling 06/03/2013  . Essential hypertension 06/03/2013  . Bradycardia by electrocardiogram 06/03/2013  . Obstructive sleep apnea 03/12/2013  . Spinal stenosis, lumbar region, with neurogenic claudication 12/14/2012   MJanna Arch PT, DPT   01/30/2019, 5:02 PM  [place] [address] [place] [address]Phone: [phone]  Fax:  [phone] Name: George SAINZMRN: [id number] of Birth: 2Jan 10, 1952

## 2019-01-30 NOTE — Therapy (Signed)
[place] MAIN [facility] SERVICES [address] [place], [place], [address] Phone: [phone]   Fax:  [phone]  Occupational Therapy Treatment  Patient Details  Name: [name] MRN: [id number] Date of Birth: 1950-05-12 No data recorded  Encounter Date: 01/30/2019  OT End of Session - 01/30/19 1318    Visit Number  2    Number of Visits  24    Date for OT Re-Evaluation  04/10/19    Authorization Type  Visit 1 of 10, reporting period starting 01/16/19    OT Start Time  1300    OT Stop Time  1345    OT Time Calculation (min)  45 min    Activity Tolerance  Patient tolerated treatment well    Behavior During Therapy  [facility] for tasks assessed/performed       Past Medical History:  Diagnosis Date  . Arthritis   . Bradycardia   . Cancer [facility]) 2013   skin cancer  . Depression    ptsd  . Dysrhythmia    chronic slow heart rate  . GERD (gastroesophageal reflux disease)   . Headache(784.0)    tension headaches non recent  . History of chicken pox   . History of kidney stones    passed  . Hypertension    treated with HCTZ  . Parkinson's disease ([place])    dx'ed 15 years ago  . PTSD (post-traumatic stress disorder)   . Shortness of breath dyspnea   . Sleep apnea    doesn't use C-pap  . Varicose veins     Past Surgical History:  Procedure Laterality Date  . CHOLECYSTECTOMY N/A 10/22/2014   Procedure: LAPAROSCOPIC CHOLECYSTECTOMY WITH INTRAOPERATIVE CHOLANGIOGRAM;  Surgeon: Dia Crawford III, MD;  Location: [facility] ORS;  Service: General;  Laterality: N/A;  . cyst removed      from lip as a child  . LUMBAR LAMINECTOMY/DECOMPRESSION MICRODISCECTOMY Bilateral 12/14/2012   Procedure: Bilateral lumbar three-four, four-five decompressive laminotomy/foraminotomy;  Surgeon: Charlie Pitter, MD;  Location: [place] NEURO ORS;  Service: Neurosurgery;  Laterality: Bilateral;  . PULSE GENERATOR IMPLANT Bilateral 12/13/2013   Procedure: Bilateral implantable pulse  generator placement;  Surgeon: Erline Levine, MD;  Location: [place] NEURO ORS;  Service: Neurosurgery;  Laterality: Bilateral;  Bilateral implantable pulse generator placement  . skin cancer removed     from ears,   12 lft arm  rt leg 15  . SUBTHALAMIC STIMULATOR BATTERY REPLACEMENT Bilateral 07/14/2017   Procedure: BILATERAL IMPLANTED PULSE GENERATOR CHANGE FOR DEEP BRAIN STIMULATOR;  Surgeon: Erline Levine, MD;  Location: [place];  Service: Neurosurgery;  Laterality: Bilateral;  . SUBTHALAMIC STIMULATOR INSERTION Bilateral 12/06/2013   Procedure: SUBTHALAMIC STIMULATOR INSERTION;  Surgeon: Erline Levine, MD;  Location: [place] NEURO ORS;  Service: Neurosurgery;  Laterality: Bilateral;  Bilateral deep brain stimulator placement    There were no vitals filed for this visit.  Subjective Assessment - 01/30/19 1315    Subjective   Pt.'s wife reports that she had vertigo on Monday so was unable to bring him to therapy.    Patient is accompanied by:  Family member    Pertinent History  Pt. is a 68 y.o. mlae who has a history of Parkinson's Disease. Pt. has a Deep Brain Stimulator in place. Pt. has a history of Low Back Pain with 2 back surgeries in August 2014, and February 2016. Pt. has a history of multiple frequent falls, unsure of how many falls in the last 6 months.  Limitations  Frequent falls, limited motor control, and [place].     Patient Stated Goals  Patient would like to be as independent as possible and be able to do yardwork again.    Currently in Pain?  Yes    Pain Score  6     Pain Location  Back    Pain Orientation  Lower    Pain Descriptors / Indicators  Aching    Pain Type  Chronic pain    Aggravating Factors   standing       OT TREATMENT    Therapeutic Exercise:  Pt. performed 2# dowel ex. For UE strengthening secondary to weakness. Bilateral shoulder flexion, chest press, circular patterns, and elbow flexion/extension were performed. 2 sets 10 reps 2# dumbbell ex. for elbow flexion and  extension, forearm supination/pronation, wrist flexion/extension, and radial deviation. Pt. requires rest breaks and verbal cues for proper technique 2 sets 10 rep. Pt. performed bilateral gross gripping with grip strengthener. Pt. worked on sustaining grip while grasping pegs and reaching at various heights. The gripper was placed in 17.9# of grip strength force.  Response to Treatment:   Pt. conitnues to present with limited BUE strength, cotor control, and [place] skills. Pt. requires verbal cues, and visual demonstration for UE technique. Pt. leans slightly to the left during UE execises. Pt. continues to work on improving BUE functioning in order to improve ADLs, and IADLs.                       OT Education - 01/30/19 1318    Education provided  Yes    Education Details  goals, POC    Person(s) Educated  Patient;Spouse    Methods  Explanation    Comprehension  Verbalized understanding    Person(s) Educated  Patient;Spouse    Methods  Explanation    Comprehension  Verbalized understanding          OT Long Term Goals - 01/16/19 1815      OT LONG TERM GOAL #1   Title  Pt. will improve Bilateral UE strength by 1 mm grade to assist with ADLs, and IADLs    Baseline  BUE strength 4/5 overall at eval    Time  12    Period  Weeks    Status  New    Target Date  04/10/19      OT LONG TERM GOAL #2   Title  Pt. will improve grip strength by 10# to open jars and containers.    Baseline  difficulty at eval    Time  12    Period  Weeks    Status  New    Target Date  04/10/19      OT LONG TERM GOAL #3   Title  Patient will complete lower body dressing with modified independence.    Baseline  min to mod assist at eval    Time  12    Period  Weeks    Status  New    Target Date  04/10/19      OT LONG TERM GOAL #4   Title  Patient to demonstrate the ability to vacuum flooring at home with use of modified techniques with supervision.    Baseline  unable at eval     Time  12    Period  Weeks    Status  New    Target Date  04/10/19      OT LONG TERM GOAL #5  Title  Patient will demonstrate the ability to retrieve a snack and transport it to the living room with modified independence.    Baseline  unable at eval    Time  12    Period  Weeks    Status  New    Target Date  04/10/19      OT LONG TERM GOAL #6   Title  Patient will improve hand function to be able to perform cutting of food with modified independence.    Baseline  wife helps to cut food when needed.    Time  8    Period  Weeks    Status  New    Target Date  03/13/19      OT LONG TERM GOAL #7   Title  Pt. will write a paragraph efficiently with 90% legibility.    Baseline  decreased legibility and poor hand endurance for writing more than 1 sentence.    Time  8    Period  Weeks    Status  New    Target Date  03/13/19            Plan - 01/30/19 1319    Clinical Impression Statement  Pt. conitnues to present with limited BUE strength, cotor control, and [place] skills. Pt. requires verbal cues, and visual demonstration for UE technique. Pt. leans slightly to the left during UE execises. Pt. continues to work on improving BUE functioning in order to improve ADLs, and IADLs.    Occupational performance deficits (Please refer to evaluation for details):  ADL's;IADL's    Body Structure / Function / Physical Skills  ADL;Coordination;Endurance;GMC;UE functional use;Balance;Decreased knowledge of use of DME;IADL;Pain;Dexterity;FMC;Strength;Mobility;ROM    Cognitive Skills  Attention;Problem Solve;Memory    Psychosocial Skills  Coping Strategies;Environmental  Adaptations;Habits;Routines and Behaviors    Rehab Potential  Good    Clinical Decision Making  Several treatment options, min-mod task modification necessary    Comorbidities Affecting Occupational Performance:  Presence of comorbidities impacting occupational performance    Comorbidities impacting occupational performance  description:  risk for falls, slow to respond and process information at times, safety    Modification or Assistance to Complete Evaluation   Min-Moderate modification of tasks or assist with assess necessary to complete eval    OT Frequency  2x / week    OT Duration  12 weeks    OT Treatment/Interventions  Self-care/ADL training;Therapeutic exercise;DME and/or AE instruction;Energy conservation;Neuromuscular education;Patient/family education;Therapeutic activities;Moist Heat;Functional Mobility Training;Cognitive remediation/compensation    Consulted and Agree with Plan of Care  Patient       Patient will benefit from skilled therapeutic intervention in order to improve the following deficits and impairments:   Body Structure / Function / Physical Skills: ADL, Coordination, Endurance, GMC, UE functional use, Balance, Decreased knowledge of use of DME, IADL, Pain, Dexterity, FMC, Strength, Mobility, ROM Cognitive Skills: Attention, Problem Solve, Memory Psychosocial Skills: Coping Strategies, Environmental  Adaptations, Habits, Routines and Behaviors   Visit Diagnosis: Muscle weakness (generalized)  Other lack of coordination    Problem List Patient Active Problem List   Diagnosis Date Noted  . Rib pain on right side 09/25/2018  . Skin sore 12/01/2017  . Hematoma 10/18/2017  . Fall at home 10/18/2017  . 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 Radicular pain in right arm 10/09/2016  . GERD (gastroesophageal reflux disease) 07/31/2016  . Trochanteric bursitis 03/03/2016  . Left hand pain 10/30/2015  . Fracture, finger, multiple sites 10/30/2015  . Colon cancer screening 07/23/2015  .  Healthcare maintenance 07/23/2015  . Gout 02/18/2015  . Depression 01/06/2015  . Irritation of eyelid 08/20/2014  . Dupuytren's contracture 08/20/2014  . Cough 07/21/2014  . Lumbar stenosis with neurogenic claudication 06/13/2014  . Preop cardiovascular exam 05/28/2014  . S/P deep brain  stimulator placement 05/08/2014  . Joint pain 01/22/2014  . Medicare annual wellness visit, initial 01/19/2014  . Advance care planning 01/19/2014  . Parkinson's disease ([place]) 12/13/2013  . PTSD (post-traumatic stress disorder) 06/13/2013  . Erectile dysfunction 06/13/2013  . HLD (hyperlipidemia) 06/13/2013  . Hip pain 06/10/2013  . Pain in joint, shoulder region 06/10/2013  . Right leg swelling 06/03/2013  . Essential hypertension 06/03/2013  . Bradycardia by electrocardiogram 06/03/2013  . Obstructive sleep apnea 03/12/2013  . Spinal stenosis, lumbar region, with neurogenic claudication 12/14/2012    Harrel Carina 01/30/2019, 1:40 PM  [place] MAIN [facility] SERVICES [address] [place], [place], [address] Phone: [phone]   Fax:  [phone]  Name: [name] MRN: [id number] Date of Birth: [birth year]

## 2019-02-04 ENCOUNTER — Ambulatory Visit: Payer: Medicare PPO

## 2019-02-04 ENCOUNTER — Encounter: Payer: Medicare PPO | Admitting: Occupational Therapy

## 2019-02-05 ENCOUNTER — Encounter: Payer: Self-pay | Admitting: Physical Therapy

## 2019-02-05 ENCOUNTER — Other Ambulatory Visit: Payer: Self-pay

## 2019-02-05 ENCOUNTER — Ambulatory Visit: Payer: Medicare PPO | Admitting: Physical Therapy

## 2019-02-05 DIAGNOSIS — R278 Other lack of coordination: Secondary | ICD-10-CM

## 2019-02-05 DIAGNOSIS — G20 Parkinson's disease: Secondary | ICD-10-CM | POA: Diagnosis not present

## 2019-02-05 DIAGNOSIS — R293 Abnormal posture: Secondary | ICD-10-CM

## 2019-02-05 DIAGNOSIS — M6281 Muscle weakness (generalized): Secondary | ICD-10-CM

## 2019-02-05 DIAGNOSIS — R2689 Other abnormalities of gait and mobility: Secondary | ICD-10-CM | POA: Diagnosis not present

## 2019-02-05 DIAGNOSIS — R296 Repeated falls: Secondary | ICD-10-CM

## 2019-02-05 DIAGNOSIS — R2681 Unsteadiness on feet: Secondary | ICD-10-CM | POA: Diagnosis not present

## 2019-02-05 DIAGNOSIS — R262 Difficulty in walking, not elsewhere classified: Secondary | ICD-10-CM | POA: Diagnosis not present

## 2019-02-05 NOTE — Therapy (Signed)
[place] MAIN [facility] SERVICES [address] [place], [place], [address] Phone: [phone]   Fax:  [phone]  Physical Therapy Treatment  Patient Details  Name: [name] MRN: [id number] Date of Birth: 1951-01-05 Referring Provider (PT): Gillermo Murdoch, NP   Encounter Date: 02/05/2019  PT End of Session - 02/05/19 0854    Visit Number  23    Number of Visits  30    Date for PT Re-Evaluation  02/11/19    Authorization Type  2/10 with PN on 01/09/19    PT Start Time  0845    PT Stop Time  0925    PT Time Calculation (min)  40 min    Equipment Utilized During Treatment  Gait belt    Activity Tolerance  Patient tolerated treatment well;Patient limited by pain    Behavior During Therapy  [facility] for tasks assessed/performed       Past Medical History:  Diagnosis Date  . Arthritis   . Bradycardia   . Cancer [facility]) 2013   skin cancer  . Depression    ptsd  . Dysrhythmia    chronic slow heart rate  . GERD (gastroesophageal reflux disease)   . Headache(784.0)    tension headaches non recent  . History of chicken pox   . History of kidney stones    passed  . Hypertension    treated with HCTZ  . Parkinson's disease ([place])    dx'ed 15 years ago  . PTSD (post-traumatic stress disorder)   . Shortness of breath dyspnea   . Sleep apnea    doesn't use C-pap  . Varicose veins     Past Surgical History:  Procedure Laterality Date  . CHOLECYSTECTOMY N/A 10/22/2014   Procedure: LAPAROSCOPIC CHOLECYSTECTOMY WITH INTRAOPERATIVE CHOLANGIOGRAM;  Surgeon: Dia Crawford III, MD;  Location: [facility] ORS;  Service: General;  Laterality: N/A;  . cyst removed      from lip as a child  . LUMBAR LAMINECTOMY/DECOMPRESSION MICRODISCECTOMY Bilateral 12/14/2012   Procedure: Bilateral lumbar three-four, four-five decompressive laminotomy/foraminotomy;  Surgeon: Charlie Pitter, MD;  Location: [place] NEURO ORS;  Service: Neurosurgery;  Laterality: Bilateral;  . PULSE  GENERATOR IMPLANT Bilateral 12/13/2013   Procedure: Bilateral implantable pulse generator placement;  Surgeon: Erline Levine, MD;  Location: [place] NEURO ORS;  Service: Neurosurgery;  Laterality: Bilateral;  Bilateral implantable pulse generator placement  . skin cancer removed     from ears,   12 lft arm  rt leg 15  . SUBTHALAMIC STIMULATOR BATTERY REPLACEMENT Bilateral 07/14/2017   Procedure: BILATERAL IMPLANTED PULSE GENERATOR CHANGE FOR DEEP BRAIN STIMULATOR;  Surgeon: Erline Levine, MD;  Location: [place];  Service: Neurosurgery;  Laterality: Bilateral;  . SUBTHALAMIC STIMULATOR INSERTION Bilateral 12/06/2013   Procedure: SUBTHALAMIC STIMULATOR INSERTION;  Surgeon: Erline Levine, MD;  Location: [place] NEURO ORS;  Service: Neurosurgery;  Laterality: Bilateral;  Bilateral deep brain stimulator placement    There were no vitals filed for this visit.  Subjective Assessment - 02/05/19 0852    Subjective  Patient is having back pain today.    Patient is accompained by:  Family member    Pertinent History  Patient is a pleasant 68 year old male who presents for for Parkinson's. Patient is awaiting disability hearing. He has an aide 14 hours/week and uses a walker/u step at baseline. Patient has been seen for physical therapy at this clinic last year and was discharged to home health due to increasing fall risk and limited ability  to negotiate home environment.  At time of discharge this physical therapist was advised to not encourage patient to ambulate and to use wheelchair by referring physician. Patient has since switched physicians to a neurosurgeon at [facility] who altered his deep brain stimulator with positive effects in January and re-adjusted it again about a month ago. Is now allowed to walk and encouraged to do so as frequently as possible. Patient continues to have balance deficits having approximately 5-6 falls a month. PMH includes: Parkinson's, HTN, GERD, HLD, spinal stenosis, PTSD, Deep brain stimulator  placement, cancer, and bradycardia.    Limitations  Walking;Standing;Sitting;House hold activities;Lifting    How long can you sit comfortably?  hours    How long can you stand comfortably?  3 minutes but varies    How long can you walk comfortably?  1-2 minutes    Patient Stated Goals  to stand and walk for longer distances for exercises.    Currently in Pain?  Yes    Pain Score  3     Pain Location  Back    Pain Orientation  Lower    Pain Descriptors / Indicators  Aching    Pain Radiating Towards  low back    Pain Onset  In the past 7 days    Pain Frequency  Intermittent    Aggravating Factors   standing    Pain Relieving Factors  pain meds    Effect of Pain on Daily Activities  no effect    Multiple Pain Sites  No    Pain Onset  In the past 7 days         Treatment:  10 reps Bilaterally 1) Floor to ceiling , cues to hold for 10 seconds,needs modeling for correct positions, VC to reach out furtherand to reach up to the ceiling 2) Side to side multidirectional Repetitive movements performed in sittingand are designed to provide retraining effort needed for sustained muscle activation in tasks, cues to lean fwd and hold position x 10 counts: Patient does not move fwd<>sidesitting<> to fwd and is not able to transition or stretch out leg very far 3) Step and reach forward , cues for good knee flex, cues to move UE and LE together, cues to rotate arms up to pronate his forearms 4) Step and reach backwards , cues for BUE back, getting toe up and bending back knee 5) Step and reach sideways, cues to turn head sideways, and turn head back to neutral, and to rotate arms and pronate forearms 6) Rock and reach forward/backward , cues to reach far fwd with UE's, patient not comfortable with feet apart, not rocking very far to get a good weight shift, not able to raise heel or raise toes much 7) Rock and reach sideways, cues for twist and look behind,  rotates side ways, unable to  twist around or turn to look behind  max cueing needed to appropriately perform  tasks with leg, hand, and head position. Decreased coordination demonstrated requiring consistent verbal cueing to correct form. . Patient continues to demonstrate some in coordination of movement with select exercises such as rock and reach and stepping backwards. Patient responds well to verbal and tactile cues to correct form and technique.  CGA to SBA for safety with activities.  cuses to increase intensity and amplitude of movements throughout session                         PT Education -  02/05/19 0853    Education provided  Yes    Education Details  HEP    Person(s) Educated  Patient    Comprehension  Returned demonstration;Verbal cues required;Tactile cues required;Need further instruction       PT Short Term Goals - 01/09/19 1030      PT SHORT TERM GOAL #1   Title  Pt will perform HEP with family's supervision, for improved balance, transfers, and gait.      Baseline  7/7 HEP given; 8/5 pt reported that his is not performing his HEP consistently, still has his paper copy 8/31 compliant    Time  2    Period  Weeks    Status  Achieved    Target Date  12/05/18        PT Long Term Goals - 01/09/19 0954      PT LONG TERM GOAL #1   Title  Patient (> 62 years old) will complete five times sit to stand test in < 15 seconds without UE support indicating an increased LE strength and improved balance.    Baseline  7/7: 20 seconds with UE support and one posterior LOB 8/12: 15 seconds one hand on knee; 01/09/19: 17.07 seconds without UE support, with 1 uncontrolled descent    Time  8    Period  Weeks    Status  Partially Met    Target Date  02/11/19      PT LONG TERM GOAL #2   Title  Patient will increase 10 meter walk test to >1.61ms as to improve gait speed for better community ambulation and to reduce fall risk.    Baseline  7/7: .58 m/s with U step walker 8/12: 1.0 m/s    Time   8    Period  Weeks    Status  Achieved      PT LONG TERM GOAL #3   Title  Patient will increase Berg Balance score by > 6 points (42/56) to demonstrate decreased fall risk during functional activities.    Baseline  7/7: 25/56 8/12: 36/56; 01/12/19: 43/56    Time  8    Period  Weeks    Status  Achieved      PT LONG TERM GOAL #4   Title  Patient will deny any falls over past 4 weeks to demonstrate improved safety awareness at home and in the community.    Baseline  7/7: averages 6 falls/month 8/12: 4 falls in past month; 01/09/19: 4-5 falls in past month    Time  8    Period  Weeks    Status  Partially Met    Target Date  02/11/19      PT LONG TERM GOAL #5   Title  Patient will increase six minute walk test distance to >1000 for progression to community ambulator and improve gait ability    Baseline  12/17/18 : 551 ft with U step walker; 01/09/19: 800 ft with U step walker    Time  8    Period  Weeks    Status  Partially Met    Target Date  02/11/19            Plan - 02/05/19 0854    Clinical Impression Statement Patient instructed in intermediate balance challenges. Patient required min-mod VCs for correct positioning; Patient had increased difficulty with dynamic steps and dule task movements especially unsupported. Patient would benefit from additional skilled PT intervention to improve strength, balance.   Personal Factors and  Comorbidities  Age;Comorbidity 3+;Past/Current Experience;Time since onset of injury/illness/exacerbation    Comorbidities  athritis, bradycardia, cancer, depression, dysrhythmia, GERD, HTN, Parkinson's, PTSD, SOB, sleep apnea    Examination-Activity Limitations  Bathing;Bed Mobility;Caring for Others;Bend;Dressing;Lift;Locomotion Level;Reach Overhead;Sit;Squat;Stairs;Stand;Toileting;Transfers    Examination-Participation Restrictions  Church;Cleaning;Community Activity;Interpersonal Relationship;Shop;Volunteer;Yard Work;Other    Stability/Clinical  Decision Making  Unstable/Unpredictable    Rehab Potential  Fair    PT Frequency  2x / week    PT Duration  8 weeks    PT Treatment/Interventions  ADLs/Self Care Home Management;Aquatic Therapy;Cryotherapy;Electrical Stimulation;Iontophoresis '4mg'$ /ml Dexamethasone;Moist Heat;Ultrasound;Contrast Bath;DME Instruction;Gait training;Stair training;Functional mobility training;Therapeutic activities;Therapeutic exercise;Balance training;Neuromuscular re-education;Patient/family education;Manual techniques;Wheelchair mobility training;Energy conservation;Passive range of motion;Traction;Orthotic Fit/Training    PT Next Visit Plan  progress weight, stability    PT Home Exercise Plan  see above    Consulted and Agree with Plan of Care  Patient;Family member/caregiver    Family Member Consulted  wife       Patient will benefit from skilled therapeutic intervention in order to improve the following deficits and impairments:  Abnormal gait, Decreased balance, Decreased coordination, Decreased mobility, Impaired tone, Postural dysfunction, Decreased strength, Decreased safety awareness, Improper body mechanics, Impaired flexibility, Decreased activity tolerance, Decreased endurance, Decreased knowledge of precautions, Difficulty walking, Pain  Visit Diagnosis: Muscle weakness (generalized)  Other lack of coordination  Repeated falls  Parkinson's disease (HCC)  Other abnormalities of gait and mobility  Unsteadiness on feet  Difficulty walking  Abnormal posture     Problem List Patient Active Problem List   Diagnosis Date Noted  . Rib pain on right side 09/25/2018  . Skin sore 12/01/2017  . Hematoma 10/18/2017  . Fall at home 10/18/2017  . 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 Radicular pain in right arm 10/09/2016  . GERD (gastroesophageal reflux disease) 07/31/2016  . Trochanteric bursitis 03/03/2016  . Left hand pain 10/30/2015  . Fracture, finger, multiple sites 10/30/2015  .  Colon cancer screening 07/23/2015  . Healthcare maintenance 07/23/2015  . Gout 02/18/2015  . Depression 01/06/2015  . Irritation of eyelid 08/20/2014  . Dupuytren's contracture 08/20/2014  . Cough 07/21/2014  . Lumbar stenosis with neurogenic claudication 06/13/2014  . Preop cardiovascular exam 05/28/2014  . S/P deep brain stimulator placement 05/08/2014  . Joint pain 01/22/2014  . Medicare annual wellness visit, initial 01/19/2014  . Advance care planning 01/19/2014  . Parkinson's disease ([place]) 12/13/2013  . PTSD (post-traumatic stress disorder) 06/13/2013  . Erectile dysfunction 06/13/2013  . HLD (hyperlipidemia) 06/13/2013  . Hip pain 06/10/2013  . Pain in joint, shoulder region 06/10/2013  . Right leg swelling 06/03/2013  . Essential hypertension 06/03/2013  . Bradycardia by electrocardiogram 06/03/2013  . Obstructive sleep apnea 03/12/2013  . Spinal stenosis, lumbar region, with neurogenic claudication 12/14/2012    Alanson Puls, [place] DPT 02/05/2019, 10:53 AM  [place] MAIN [facility] SERVICES [address] [place], [place], [address] Phone: [phone]   Fax:  [phone]  Name: [name] MRN: [id number] Date of Birth: Jun 27, 1950

## 2019-02-06 ENCOUNTER — Ambulatory Visit: Payer: Medicare PPO

## 2019-02-06 ENCOUNTER — Encounter: Payer: Medicare PPO | Admitting: Occupational Therapy

## 2019-02-10 ENCOUNTER — Other Ambulatory Visit: Payer: Self-pay | Admitting: Family Medicine

## 2019-02-11 ENCOUNTER — Ambulatory Visit: Payer: Medicare PPO

## 2019-02-11 ENCOUNTER — Encounter: Payer: Medicare PPO | Admitting: Occupational Therapy

## 2019-02-11 ENCOUNTER — Ambulatory Visit: Payer: Medicare PPO | Admitting: Physician Assistant

## 2019-02-13 ENCOUNTER — Other Ambulatory Visit: Payer: Self-pay

## 2019-02-13 ENCOUNTER — Encounter: Payer: Self-pay | Admitting: Occupational Therapy

## 2019-02-13 ENCOUNTER — Ambulatory Visit: Payer: Medicare PPO | Admitting: Occupational Therapy

## 2019-02-13 ENCOUNTER — Ambulatory Visit: Payer: Medicare PPO

## 2019-02-13 DIAGNOSIS — R278 Other lack of coordination: Secondary | ICD-10-CM

## 2019-02-13 DIAGNOSIS — S72141A Displaced intertrochanteric fracture of right femur, initial encounter for closed fracture: Secondary | ICD-10-CM | POA: Diagnosis not present

## 2019-02-13 DIAGNOSIS — S72001A Fracture of unspecified part of neck of right femur, initial encounter for closed fracture: Secondary | ICD-10-CM | POA: Diagnosis not present

## 2019-02-13 DIAGNOSIS — R296 Repeated falls: Secondary | ICD-10-CM

## 2019-02-13 DIAGNOSIS — M6281 Muscle weakness (generalized): Secondary | ICD-10-CM

## 2019-02-13 DIAGNOSIS — G20 Parkinson's disease: Secondary | ICD-10-CM

## 2019-02-13 NOTE — Therapy (Signed)
[place] MAIN [facility] SERVICES [address] [place], [place], [address] Phone: [phone]   Fax:  [phone]  Physical Therapy Treatment/ RECERT  Patient Details  Name: [name] MRN: [id number] Date of Birth: Sep 12, 1950 Referring Provider (PT): Gillermo Murdoch, NP   Encounter Date: 02/13/2019  PT End of Session - 02/13/19 1754    Visit Number  24    Number of Visits  40    Date for PT Re-Evaluation  04/10/19    Authorization Type  4/10 with PN on 01/09/19    PT Start Time  1345    PT Stop Time  1430    PT Time Calculation (min)  45 min    Equipment Utilized During Treatment  Gait belt    Activity Tolerance  Patient tolerated treatment well    Behavior During Therapy  [facility] for tasks assessed/performed       Past Medical History:  Diagnosis Date  . Arthritis   . Bradycardia   . Cancer [facility]) 2013   skin cancer  . Depression    ptsd  . Dysrhythmia    chronic slow heart rate  . GERD (gastroesophageal reflux disease)   . Headache(784.0)    tension headaches non recent  . History of chicken pox   . History of kidney stones    passed  . Hypertension    treated with HCTZ  . Parkinson's disease ([place])    dx'ed 15 years ago  . PTSD (post-traumatic stress disorder)   . Shortness of breath dyspnea   . Sleep apnea    doesn't use C-pap  . Varicose veins     Past Surgical History:  Procedure Laterality Date  . CHOLECYSTECTOMY N/A 10/22/2014   Procedure: LAPAROSCOPIC CHOLECYSTECTOMY WITH INTRAOPERATIVE CHOLANGIOGRAM;  Surgeon: Dia Crawford III, MD;  Location: [facility] ORS;  Service: General;  Laterality: N/A;  . cyst removed      from lip as a child  . LUMBAR LAMINECTOMY/DECOMPRESSION MICRODISCECTOMY Bilateral 12/14/2012   Procedure: Bilateral lumbar three-four, four-five decompressive laminotomy/foraminotomy;  Surgeon: Charlie Pitter, MD;  Location: [place] NEURO ORS;  Service: Neurosurgery;  Laterality: Bilateral;  . PULSE GENERATOR IMPLANT  Bilateral 12/13/2013   Procedure: Bilateral implantable pulse generator placement;  Surgeon: Erline Levine, MD;  Location: [place] NEURO ORS;  Service: Neurosurgery;  Laterality: Bilateral;  Bilateral implantable pulse generator placement  . skin cancer removed     from ears,   12 lft arm  rt leg 15  . SUBTHALAMIC STIMULATOR BATTERY REPLACEMENT Bilateral 07/14/2017   Procedure: BILATERAL IMPLANTED PULSE GENERATOR CHANGE FOR DEEP BRAIN STIMULATOR;  Surgeon: Erline Levine, MD;  Location: [place];  Service: Neurosurgery;  Laterality: Bilateral;  . SUBTHALAMIC STIMULATOR INSERTION Bilateral 12/06/2013   Procedure: SUBTHALAMIC STIMULATOR INSERTION;  Surgeon: Erline Levine, MD;  Location: [place] NEURO ORS;  Service: Neurosurgery;  Laterality: Bilateral;  Bilateral deep brain stimulator placement    There were no vitals filed for this visit.  Subjective Assessment - 02/13/19 1753    Subjective  Patient and wife present to therapy reporting no falls since last session. Was very fatigued since last session.    Patient is accompained by:  Family member    Pertinent History  Patient is a pleasant 68 year old male who presents for for Parkinson's. Patient is awaiting disability hearing. He has an aide 14 hours/week and uses a walker/u step at baseline. Patient has been seen for physical therapy at this clinic last year and was discharged to  home health due to increasing fall risk and limited ability to negotiate home environment.  At time of discharge this physical therapist was advised to not encourage patient to ambulate and to use wheelchair by referring physician. Patient has since switched physicians to a neurosurgeon at [facility] who altered his deep brain stimulator with positive effects in January and re-adjusted it again about a month ago. Is now allowed to walk and encouraged to do so as frequently as possible. Patient continues to have balance deficits having approximately 5-6 falls a month. PMH includes: Parkinson's, HTN,  GERD, HLD, spinal stenosis, PTSD, Deep brain stimulator placement, cancer, and bradycardia.    Limitations  Walking;Standing;Sitting;House hold activities;Lifting    How long can you sit comfortably?  hours    How long can you stand comfortably?  3 minutes but varies    How long can you walk comfortably?  1-2 minutes    Patient Stated Goals  to stand and walk for longer distances for exercises.    Currently in Pain?  No/denies        Goals [facility] PT Assessment - 02/13/19 0001      Standardized Balance Assessment   Standardized Balance Assessment  Berg Balance Test      Berg Balance Test   Sit to Stand  Able to stand without using hands and stabilize independently    Standing Unsupported  Able to stand safely 2 minutes    Sitting with Back Unsupported but Feet Supported on Floor or Stool  Able to sit safely and securely 2 minutes    Stand to Sit  Sits safely with minimal use of hands    Transfers  Able to transfer safely, minor use of hands    Standing Unsupported with Eyes Closed  Able to stand 10 seconds with supervision    Standing Unsupported with Feet Together  Able to place feet together independently and stand for 1 minute with supervision    From Standing, Reach Forward with Outstretched Arm  Can reach forward >12 cm safely (5")    From Standing Position, Pick up Object from Floor  Able to pick up shoe, needs supervision    From Standing Position, Turn to Look Behind Over each Shoulder  Looks behind from both sides and weight shifts well    Turn 360 Degrees  Able to turn 360 degrees safely in 4 seconds or less    Standing Unsupported, Alternately Place Feet on Step/Stool  Able to stand independently and complete 8 steps >20 seconds    Standing Unsupported, One Foot in Front  Able to plae foot ahead of the other independently and hold 30 seconds    Standing on One Leg  Able to lift leg independently and hold equal to or more than 3 seconds    Total Score  48       5x STS: SUE  support 12 seconds, 14 seconds no UE support no LOB's. BERG: 48/56 Falls: 2 falls  6 minute walk test: 825 ft with U step    Treatment: Standing with # 4 lb ankle weight: CGA for stability  -Hip extension with bilateral upper extremity support, cueing for neutral hip alignment, upright posture for optimal muscle recruitment, and sequencing, 10x each LE,  -Hip abduction with bilateral upper extremity support, cueing for neutral foot alignment for correct muscle activation, 10x each LE -Hip flexion with bilateral upper extremity support, cueing for body mechanics, speed of muscle recruitment for optimal strengthening and stabilization 10x each LE  Seated with # 4 ankle weights  -Seated marches with upright posture, back away from back of chair for abdominal/trunk activation/stabilization, 10x each LE -Seated LAQ with 3 second holds, 10x each LE, cueing for muscle activation and sequencing for neutral alignment -Seated IR/ER with cueing for stabilizing knee placement with lateral foot movement for optimal muscle recruitment, 10x each LE   standing heel toe raises 10x each LE     Pt educated throughout session about proper posture and technique with exercises. Improved exercise technique, movement at target joints, use of target muscles after min to mod verbal, visual, tactile cues             PT Education - 02/13/19 1754    Education provided  Yes    Education Details  goals, POC, exercise technique    Person(s) Educated  Patient    Methods  Explanation;Demonstration;Tactile cues;Verbal cues    Comprehension  Verbalized understanding;Returned demonstration;Verbal cues required;Tactile cues required       PT Short Term Goals - 01/09/19 1030      PT SHORT TERM GOAL #1   Title  Pt will perform HEP with family's supervision, for improved balance, transfers, and gait.      Baseline  7/7 HEP given; 8/5 pt reported that his is not performing his HEP consistently, still has  his paper copy 8/31 compliant    Time  2    Period  Weeks    Status  Achieved    Target Date  12/05/18        PT Long Term Goals - 02/13/19 1757      PT LONG TERM GOAL #1   Title  Patient (> 1 years old) will complete five times sit to stand test in < 15 seconds without UE support indicating an increased LE strength and improved balance.    Baseline  7/7: 20 seconds with UE support and one posterior LOB 8/12: 15 seconds one hand on knee; 01/09/19: 17.07 seconds without UE support, with 1 uncontrolled descent 10/28: 14 seconds no UE support    Time  8    Period  Weeks    Status  Achieved      PT LONG TERM GOAL #2   Title  Patient will increase 10 meter walk test to >1.33ms as to improve gait speed for better community ambulation and to reduce fall risk.    Baseline  7/7: .58 m/s with U step walker 8/12: 1.0 m/s    Time  8    Period  Weeks    Status  Achieved      PT LONG TERM GOAL #3   Title  Patient will increase Berg Balance score by > 6 points (42/56) to demonstrate decreased fall risk during functional activities.    Baseline  7/7: 25/56 8/12: 36/56; 01/12/19: 43/56 10/28: 48/56    Time  8    Period  Weeks    Status  Achieved      PT LONG TERM GOAL #4   Title  Patient will deny any falls over past 4 weeks to demonstrate improved safety awareness at home and in the community.    Baseline  7/7: averages 6 falls/month 8/12: 4 falls in past month; 01/09/19: 4-5 falls in past month 10/28: 2-3 falls    Time  8    Period  Weeks    Status  Partially Met    Target Date  04/10/19      PT LONG TERM GOAL #5  Title  Patient will increase six minute walk test distance to >1000 for progression to community ambulator and improve gait ability    Baseline  12/17/18 : 551 ft with U step walker; 01/09/19: 800 ft with U step walker 10/28: 825 ft with u step walker    Time  8    Period  Weeks    Status  Partially Met    Target Date  04/10/19      PT LONG TERM GOAL #6   Title  Pt will  improve BERG to 54/56  in order to demonstrate clinically significant improvement in balance for decreased fall risk    Baseline  10/28: 48/56    Time  8    Period  Weeks    Status  New    Target Date  04/10/19            Plan - 02/13/19 1757    Clinical Impression Statement  Patient demonstrates progress towards functional goals at this time with improved speed and safety with transfers, improved stability with static and dynamic stability, and decreased episodes of falling. Patient continues to be limited with dual task, spatial awareness, coordination, and capacity for prolonged mobility at this time. Patient's condition has the potential to improve in response to therapy. Maximum improvement is yet to be obtained. The anticipated improvement is attainable and reasonable in a generally predictable time. Patient will benefit from skilled physical therapy to increase stability, strength, mobility, and posture to decrease falls risk and improve quality of life    Personal Factors and Comorbidities  Age;Comorbidity 3+;Past/Current Experience;Time since onset of injury/illness/exacerbation    Comorbidities  athritis, bradycardia, cancer, depression, dysrhythmia, GERD, HTN, Parkinson's, PTSD, SOB, sleep apnea    Examination-Activity Limitations  Bathing;Bed Mobility;Caring for Others;Bend;Dressing;Lift;Locomotion Level;Reach Overhead;Sit;Squat;Stairs;Stand;Toileting;Transfers    Examination-Participation Restrictions  Church;Cleaning;Community Activity;Interpersonal Relationship;Shop;Volunteer;Yard Work;Other    Stability/Clinical Decision Making  Unstable/Unpredictable    Rehab Potential  Fair    PT Frequency  2x / week    PT Duration  8 weeks    PT Treatment/Interventions  ADLs/Self Care Home Management;Aquatic Therapy;Cryotherapy;Electrical Stimulation;Iontophoresis '4mg'$ /ml Dexamethasone;Moist Heat;Ultrasound;Contrast Bath;DME Instruction;Gait training;Stair training;Functional mobility  training;Therapeutic activities;Therapeutic exercise;Balance training;Neuromuscular re-education;Patient/family education;Manual techniques;Wheelchair mobility training;Energy conservation;Passive Mcgee of motion;Traction;Orthotic Fit/Training    PT Next Visit Plan  progress weight, stability    PT Home Exercise Plan  see above    Consulted and Agree with Plan of Care  Patient;Family member/caregiver    Family Member Consulted  wife       Patient will benefit from skilled therapeutic intervention in order to improve the following deficits and impairments:  Abnormal gait, Decreased balance, Decreased coordination, Decreased mobility, Impaired tone, Postural dysfunction, Decreased strength, Decreased safety awareness, Improper body mechanics, Impaired flexibility, Decreased activity tolerance, Decreased endurance, Decreased knowledge of precautions, Difficulty walking, Pain  Visit Diagnosis: Muscle weakness (generalized)  Other lack of coordination  Repeated falls  Parkinson's disease [facility])     Problem List Patient Active Problem List   Diagnosis Date Noted  . Rib pain on right side 09/25/2018  . Skin sore 12/01/2017  . Hematoma 10/18/2017  . Fall at home 10/18/2017  . 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 Radicular pain in right arm 10/09/2016  . GERD (gastroesophageal reflux disease) 07/31/2016  . Trochanteric bursitis 03/03/2016  . Left hand pain 10/30/2015  . Fracture, finger, multiple sites 10/30/2015  . Colon cancer screening 07/23/2015  . Healthcare maintenance 07/23/2015  . Gout 02/18/2015  . Depression 01/06/2015  . Irritation  of eyelid 08/20/2014  . Dupuytren's contracture 08/20/2014  . Cough 07/21/2014  . Lumbar stenosis with neurogenic claudication 06/13/2014  . Preop cardiovascular exam 05/28/2014  . S/P deep brain stimulator placement 05/08/2014  . Joint pain 01/22/2014  . Medicare annual wellness visit, initial 01/19/2014  . Advance care planning 01/19/2014   . Parkinson's disease ([place]) 12/13/2013  . PTSD (post-traumatic stress disorder) 06/13/2013  . Erectile dysfunction 06/13/2013  . HLD (hyperlipidemia) 06/13/2013  . Hip pain 06/10/2013  . Pain in joint, shoulder region 06/10/2013  . Right leg swelling 06/03/2013  . Essential hypertension 06/03/2013  . Bradycardia by electrocardiogram 06/03/2013  . Obstructive sleep apnea 03/12/2013  . Spinal stenosis, lumbar region, with neurogenic claudication 12/14/2012   Janna Arch, PT, DPT   02/13/2019, 6:00 PM  [place] [facility] SERVICES [address] [place], [place], [address] Phone: [phone]   Fax:  [phone]  Name: [name] MRN: [id number] Date of Birth: [birth year]

## 2019-02-13 NOTE — Therapy (Signed)
[place] MAIN [facility] SERVICES [address] [place], [place], [address] Phone: [phone]   Fax:  [phone]  Occupational Therapy Treatment  Patient Details  Name: [name] MRN: [id number] Date of Birth: [birth year] No data recorded  Encounter Date: 02/13/2019  OT End of Session - 02/13/19 1305    Visit Number  3    Number of Visits  24    Date for OT Re-Evaluation  04/10/19    Authorization Type  reporting period starting 01/16/19    OT Start Time  1300    OT Stop Time  1345    OT Time Calculation (min)  45 min    Activity Tolerance  Patient tolerated treatment well    Behavior During Therapy  [facility] for tasks assessed/performed       Past Medical History:  Diagnosis Date  . Arthritis   . Bradycardia   . Cancer [facility]) 2013   skin cancer  . Depression    ptsd  . Dysrhythmia    chronic slow heart rate  . GERD (gastroesophageal reflux disease)   . Headache(784.0)    tension headaches non recent  . History of chicken pox   . History of kidney stones    passed  . Hypertension    treated with HCTZ  . Parkinson's disease ([place])    dx'ed 15 years ago  . PTSD (post-traumatic stress disorder)   . Shortness of breath dyspnea   . Sleep apnea    doesn't use C-pap  . Varicose veins     Past Surgical History:  Procedure Laterality Date  . CHOLECYSTECTOMY N/A 10/22/2014   Procedure: LAPAROSCOPIC CHOLECYSTECTOMY WITH INTRAOPERATIVE CHOLANGIOGRAM;  Surgeon: Dia Crawford III, MD;  Location: [facility] ORS;  Service: General;  Laterality: N/A;  . cyst removed      from lip as a child  . LUMBAR LAMINECTOMY/DECOMPRESSION MICRODISCECTOMY Bilateral 12/14/2012   Procedure: Bilateral lumbar three-four, four-five decompressive laminotomy/foraminotomy;  Surgeon: Charlie Pitter, MD;  Location: [place] NEURO ORS;  Service: Neurosurgery;  Laterality: Bilateral;  . PULSE GENERATOR IMPLANT Bilateral 12/13/2013   Procedure: Bilateral implantable pulse generator  placement;  Surgeon: Erline Levine, MD;  Location: [place] NEURO ORS;  Service: Neurosurgery;  Laterality: Bilateral;  Bilateral implantable pulse generator placement  . skin cancer removed     from ears,   12 lft arm  rt leg 15  . SUBTHALAMIC STIMULATOR BATTERY REPLACEMENT Bilateral 07/14/2017   Procedure: BILATERAL IMPLANTED PULSE GENERATOR CHANGE FOR DEEP BRAIN STIMULATOR;  Surgeon: Erline Levine, MD;  Location: [place];  Service: Neurosurgery;  Laterality: Bilateral;  . SUBTHALAMIC STIMULATOR INSERTION Bilateral 12/06/2013   Procedure: SUBTHALAMIC STIMULATOR INSERTION;  Surgeon: Erline Levine, MD;  Location: [place] NEURO ORS;  Service: Neurosurgery;  Laterality: Bilateral;  Bilateral deep brain stimulator placement    There were no vitals filed for this visit.  Subjective Assessment - 02/13/19 1304    Subjective   Pt. reports back pain    Patient is accompanied by:  Family member    Pertinent History  Pt. is a 68 y.o. mlae who has a history of Parkinson's Disease. Pt. has a Deep Brain Stimulator in place. Pt. has a history of Low Back Pain with 2 back surgeries in August 2014, and February 2016. Pt. has a history of multiple frequent falls, unsure of how many falls in the last 6 months.    Currently in Pain?  No/denies    Pain Score  3  Pain Location  Back    Pain Orientation  Lower    Pain Descriptors / Indicators  Aching       OT TREATMENT    Neuro muscular re-education:  Pt. worked on BUE's grasping, and manipulating 1/2", 1/4", and 1" washers from a magnetic dish using 2pt. grasp pattern. Pt. worked on reaching up, stabilizing, and sustaining shoulder elevation while placing the washer over a small precise target on vertical dowels positioned at various angles. Pt. worked on challenging the radial, and ulnar aspects of his together by stabilizing an extra long bolt with his 3rd through 5th digits while screwing the nut through an extra long bolt with his 2nd digit, and thumb. Pt. worked on [facility]  skills grasping 1" sticks, 1/4" collars, and 1/4" washers. Pt. worked on storing the objects in the palm, and translatory skills moving the items from the palm of the hand to the tip of the 2nd digit, and thumb. Pt. Has difficulty with moving small objects through his hand, dropping small objects. Pt. worked on removing the pegs using bilateral alternating hand patterns.  Response to Treatment:  Pt. Is making progress, and has been trying to use his hands more at home. Pt. continues to present with limited BUE strength, grip strength, pinch strength,  motor control, and [facility] skills making it hard to perform ADL, and IADL tasks. Pt. Dropped several of the extra small objects from the palm of his hand. Pt. continues to work on improving [employer], speed, and dexterity skills in order to improve overall ADL, and IADL functioning.                        OT Education - 02/13/19 1305    Education provided  Yes    Education Details  goals, POC    Person(s) Educated  Patient;Spouse    Methods  Explanation    Comprehension  Verbalized understanding          OT Long Term Goals - 01/16/19 1815      OT LONG TERM GOAL #1   Title  Pt. will improve Bilateral UE strength by 1 mm grade to assist with ADLs, and IADLs    Baseline  BUE strength 4/5 overall at eval    Time  12    Period  Weeks    Status  New    Target Date  04/10/19      OT LONG TERM GOAL #2   Title  Pt. will improve grip strength by 10# to open jars and containers.    Baseline  difficulty at eval    Time  12    Period  Weeks    Status  New    Target Date  04/10/19      OT LONG TERM GOAL #3   Title  Patient will complete lower body dressing with modified independence.    Baseline  min to mod assist at eval    Time  12    Period  Weeks    Status  New    Target Date  04/10/19      OT LONG TERM GOAL #4   Title  Patient to demonstrate the ability to vacuum flooring at home with use of modified techniques with  supervision.    Baseline  unable at eval    Time  12    Period  Weeks    Status  New    Target Date  04/10/19  OT LONG TERM GOAL #5   Title  Patient will demonstrate the ability to retrieve a snack and transport it to the living room with modified independence.    Baseline  unable at eval    Time  12    Period  Weeks    Status  New    Target Date  04/10/19      OT LONG TERM GOAL #6   Title  Patient will improve hand function to be able to perform cutting of food with modified independence.    Baseline  wife helps to cut food when needed.    Time  8    Period  Weeks    Status  New    Target Date  03/13/19      OT LONG TERM GOAL #7   Title  Pt. will write a paragraph efficiently with 90% legibility.    Baseline  decreased legibility and poor hand endurance for writing more than 1 sentence.    Time  8    Period  Weeks    Status  New    Target Date  03/13/19            Plan - 02/13/19 1310    Clinical Impression Statement Pt. Is making progress, and has been trying to use his hands more at home. Pt. continues to present with limited BUE strength, grip strength, pinch strength,  motor control, and [facility] skills making it hard to perform ADL, and IADL tasks. Pt. Dropped several of the extra small objects from the palm of his hand. Pt. continues to work on improving [facility], speed, and dexterity skills in order to improve overall ADL, and IADL functioning.    Occupational performance deficits (Please refer to evaluation for details):  ADL's;IADL's    Body Structure / Function / Physical Skills  ADL;Coordination;Endurance;GMC;UE functional use;Balance;Decreased knowledge of use of DME;IADL;Pain;Dexterity;FMC;Strength;Mobility;ROM    Cognitive Skills  Attention;Problem Solve;Memory    Psychosocial Skills  Coping Strategies;Environmental  Adaptations;Habits;Routines and Behaviors    Rehab Potential  Good    Clinical Decision Making  Several treatment options, min-mod task modification  necessary    Comorbidities Affecting Occupational Performance:  Presence of comorbidities impacting occupational performance    Comorbidities impacting occupational performance description:  risk for falls, slow to respond and process information at times, safety    Modification or Assistance to Complete Evaluation   Min-Moderate modification of tasks or assist with assess necessary to complete eval    OT Frequency  2x / week    OT Duration  12 weeks    OT Treatment/Interventions  Self-care/ADL training;Therapeutic exercise;DME and/or AE instruction;Energy conservation;Neuromuscular education;Patient/family education;Therapeutic activities;Moist Heat;Functional Mobility Training;Cognitive remediation/compensation    Consulted and Agree with Plan of Care  Patient       Patient will benefit from skilled therapeutic intervention in order to improve the following deficits and impairments:   Body Structure / Function / Physical Skills: ADL, Coordination, Endurance, GMC, UE functional use, Balance, Decreased knowledge of use of DME, IADL, Pain, Dexterity, FMC, Strength, Mobility, ROM Cognitive Skills: Attention, Problem Solve, Memory Psychosocial Skills: Coping Strategies, Environmental  Adaptations, Habits, Routines and Behaviors   Visit Diagnosis: Muscle weakness (generalized)  Other lack of coordination    Problem List Patient Active Problem List   Diagnosis Date Noted  . Rib pain on right side 09/25/2018  . Skin sore 12/01/2017  . Hematoma 10/18/2017  . Fall at home 10/18/2017  . 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 Radicular pain  in right arm 10/09/2016  . GERD (gastroesophageal reflux disease) 07/31/2016  . Trochanteric bursitis 03/03/2016  . Left hand pain 10/30/2015  . Fracture, finger, multiple sites 10/30/2015  . Colon cancer screening 07/23/2015  . Healthcare maintenance 07/23/2015  . Gout 02/18/2015  . Depression 01/06/2015  . Irritation of eyelid 08/20/2014  .  Dupuytren's contracture 08/20/2014  . Cough 07/21/2014  . Lumbar stenosis with neurogenic claudication 06/13/2014  . Preop cardiovascular exam 05/28/2014  . S/P deep brain stimulator placement 05/08/2014  . Joint pain 01/22/2014  . Medicare annual wellness visit, initial 01/19/2014  . Advance care planning 01/19/2014  . Parkinson's disease ([place]) 12/13/2013  . PTSD (post-traumatic stress disorder) 06/13/2013  . Erectile dysfunction 06/13/2013  . HLD (hyperlipidemia) 06/13/2013  . Hip pain 06/10/2013  . Pain in joint, shoulder region 06/10/2013  . Right leg swelling 06/03/2013  . Essential hypertension 06/03/2013  . Bradycardia by electrocardiogram 06/03/2013  . Obstructive sleep apnea 03/12/2013  . Spinal stenosis, lumbar region, with neurogenic claudication 12/14/2012    Harrel Carina, MS,  OTR/L 02/13/2019, 2:38 PM  [place] MAIN [facility] SERVICES [address] [place], [place], [address] Phone: [phone]   Fax:  [phone]  Name: RAHMAN AGOSTA MRN: [id number] Date of Birth: 12-25-1950

## 2019-02-16 ENCOUNTER — Emergency Department: Payer: Medicare PPO

## 2019-02-16 ENCOUNTER — Inpatient Hospital Stay
Admission: EM | Admit: 2019-02-16 | Discharge: 2019-02-26 | DRG: 480 | Disposition: A | Discharge status: Home / Self Care | Payer: Medicare PPO | Attending: Internal Medicine | Admitting: Internal Medicine

## 2019-02-16 ENCOUNTER — Encounter: Payer: Self-pay | Admitting: Anesthesiology

## 2019-02-16 ENCOUNTER — Other Ambulatory Visit: Payer: Self-pay

## 2019-02-16 DIAGNOSIS — R41841 Cognitive communication deficit: Secondary | ICD-10-CM | POA: Diagnosis not present

## 2019-02-16 DIAGNOSIS — M6281 Muscle weakness (generalized): Secondary | ICD-10-CM | POA: Diagnosis not present

## 2019-02-16 DIAGNOSIS — R279 Unspecified lack of coordination: Secondary | ICD-10-CM | POA: Diagnosis not present

## 2019-02-16 DIAGNOSIS — Z960 Presence of urogenital implants: Secondary | ICD-10-CM | POA: Diagnosis not present

## 2019-02-16 DIAGNOSIS — S72001A Fracture of unspecified part of neck of right femur, initial encounter for closed fracture: Secondary | ICD-10-CM | POA: Diagnosis not present

## 2019-02-16 DIAGNOSIS — I119 Hypertensive heart disease without heart failure: Secondary | ICD-10-CM | POA: Diagnosis not present

## 2019-02-16 DIAGNOSIS — I69891 Dysphagia following other cerebrovascular disease: Secondary | ICD-10-CM | POA: Diagnosis not present

## 2019-02-16 DIAGNOSIS — M1990 Unspecified osteoarthritis, unspecified site: Secondary | ICD-10-CM | POA: Diagnosis present

## 2019-02-16 DIAGNOSIS — I10 Essential (primary) hypertension: Secondary | ICD-10-CM | POA: Diagnosis not present

## 2019-02-16 DIAGNOSIS — W19XXXA Unspecified fall, initial encounter: Secondary | ICD-10-CM | POA: Diagnosis not present

## 2019-02-16 DIAGNOSIS — Z20828 Contact with and (suspected) exposure to other viral communicable diseases: Secondary | ICD-10-CM | POA: Diagnosis not present

## 2019-02-16 DIAGNOSIS — R404 Transient alteration of awareness: Secondary | ICD-10-CM | POA: Diagnosis not present

## 2019-02-16 DIAGNOSIS — Z791 Long term (current) use of non-steroidal anti-inflammatories (NSAID): Secondary | ICD-10-CM

## 2019-02-16 DIAGNOSIS — Z87442 Personal history of urinary calculi: Secondary | ICD-10-CM

## 2019-02-16 DIAGNOSIS — R1310 Dysphagia, unspecified: Secondary | ICD-10-CM | POA: Diagnosis present

## 2019-02-16 DIAGNOSIS — S72141D Displaced intertrochanteric fracture of right femur, subsequent encounter for closed fracture with routine healing: Secondary | ICD-10-CM | POA: Diagnosis not present

## 2019-02-16 DIAGNOSIS — Z7401 Bed confinement status: Secondary | ICD-10-CM | POA: Diagnosis not present

## 2019-02-16 DIAGNOSIS — R7881 Bacteremia: Secondary | ICD-10-CM | POA: Diagnosis not present

## 2019-02-16 DIAGNOSIS — J690 Pneumonitis due to inhalation of food and vomit: Secondary | ICD-10-CM | POA: Diagnosis not present

## 2019-02-16 DIAGNOSIS — Z471 Aftercare following joint replacement surgery: Secondary | ICD-10-CM | POA: Diagnosis not present

## 2019-02-16 DIAGNOSIS — G20 Parkinson's disease: Secondary | ICD-10-CM | POA: Diagnosis not present

## 2019-02-16 DIAGNOSIS — W010XXA Fall on same level from slipping, tripping and stumbling without subsequent striking against object, initial encounter: Secondary | ICD-10-CM | POA: Diagnosis present

## 2019-02-16 DIAGNOSIS — R001 Bradycardia, unspecified: Secondary | ICD-10-CM | POA: Diagnosis not present

## 2019-02-16 DIAGNOSIS — S72141A Displaced intertrochanteric fracture of right femur, initial encounter for closed fracture: Secondary | ICD-10-CM | POA: Diagnosis not present

## 2019-02-16 DIAGNOSIS — Z87891 Personal history of nicotine dependence: Secondary | ICD-10-CM

## 2019-02-16 DIAGNOSIS — Z888 Allergy status to other drugs, medicaments and biological substances status: Secondary | ICD-10-CM | POA: Diagnosis not present

## 2019-02-16 DIAGNOSIS — Z885 Allergy status to narcotic agent status: Secondary | ICD-10-CM | POA: Diagnosis not present

## 2019-02-16 DIAGNOSIS — F4310 Post-traumatic stress disorder, unspecified: Secondary | ICD-10-CM | POA: Diagnosis not present

## 2019-02-16 DIAGNOSIS — K219 Gastro-esophageal reflux disease without esophagitis: Secondary | ICD-10-CM | POA: Diagnosis not present

## 2019-02-16 DIAGNOSIS — S72001D Fracture of unspecified part of neck of right femur, subsequent encounter for closed fracture with routine healing: Secondary | ICD-10-CM | POA: Diagnosis not present

## 2019-02-16 DIAGNOSIS — Z85828 Personal history of other malignant neoplasm of skin: Secondary | ICD-10-CM | POA: Diagnosis not present

## 2019-02-16 DIAGNOSIS — G4733 Obstructive sleep apnea (adult) (pediatric): Secondary | ICD-10-CM | POA: Diagnosis not present

## 2019-02-16 DIAGNOSIS — E782 Mixed hyperlipidemia: Secondary | ICD-10-CM | POA: Diagnosis not present

## 2019-02-16 DIAGNOSIS — B9620 Unspecified Escherichia coli [E. coli] as the cause of diseases classified elsewhere: Secondary | ICD-10-CM

## 2019-02-16 DIAGNOSIS — S199XXA Unspecified injury of neck, initial encounter: Secondary | ICD-10-CM | POA: Diagnosis not present

## 2019-02-16 DIAGNOSIS — R1312 Dysphagia, oropharyngeal phase: Secondary | ICD-10-CM | POA: Diagnosis not present

## 2019-02-16 DIAGNOSIS — Z801 Family history of malignant neoplasm of trachea, bronchus and lung: Secondary | ICD-10-CM

## 2019-02-16 DIAGNOSIS — F32A Depression, unspecified: Secondary | ICD-10-CM | POA: Diagnosis present

## 2019-02-16 DIAGNOSIS — R410 Disorientation, unspecified: Secondary | ICD-10-CM | POA: Diagnosis not present

## 2019-02-16 DIAGNOSIS — R262 Difficulty in walking, not elsewhere classified: Secondary | ICD-10-CM | POA: Diagnosis not present

## 2019-02-16 DIAGNOSIS — R5381 Other malaise: Secondary | ICD-10-CM | POA: Diagnosis not present

## 2019-02-16 DIAGNOSIS — Z419 Encounter for procedure for purposes other than remedying health state, unspecified: Secondary | ICD-10-CM

## 2019-02-16 DIAGNOSIS — R2681 Unsteadiness on feet: Secondary | ICD-10-CM | POA: Diagnosis not present

## 2019-02-16 DIAGNOSIS — S7290XD Unspecified fracture of unspecified femur, subsequent encounter for closed fracture with routine healing: Secondary | ICD-10-CM | POA: Diagnosis not present

## 2019-02-16 DIAGNOSIS — S72009A Fracture of unspecified part of neck of unspecified femur, initial encounter for closed fracture: Secondary | ICD-10-CM

## 2019-02-16 DIAGNOSIS — W14XXXA Fall from tree, initial encounter: Secondary | ICD-10-CM | POA: Diagnosis not present

## 2019-02-16 DIAGNOSIS — E785 Hyperlipidemia, unspecified: Secondary | ICD-10-CM | POA: Diagnosis present

## 2019-02-16 DIAGNOSIS — N529 Male erectile dysfunction, unspecified: Secondary | ICD-10-CM | POA: Diagnosis present

## 2019-02-16 DIAGNOSIS — Z79899 Other long term (current) drug therapy: Secondary | ICD-10-CM | POA: Diagnosis not present

## 2019-02-16 DIAGNOSIS — Z9682 Presence of neurostimulator: Secondary | ICD-10-CM | POA: Diagnosis not present

## 2019-02-16 DIAGNOSIS — K5900 Constipation, unspecified: Secondary | ICD-10-CM | POA: Diagnosis not present

## 2019-02-16 DIAGNOSIS — F339 Major depressive disorder, recurrent, unspecified: Secondary | ICD-10-CM | POA: Diagnosis not present

## 2019-02-16 DIAGNOSIS — Z03818 Encounter for observation for suspected exposure to other biological agents ruled out: Secondary | ICD-10-CM | POA: Diagnosis not present

## 2019-02-16 DIAGNOSIS — Z9049 Acquired absence of other specified parts of digestive tract: Secondary | ICD-10-CM | POA: Diagnosis not present

## 2019-02-16 DIAGNOSIS — S72001G Fracture of unspecified part of neck of right femur, subsequent encounter for closed fracture with delayed healing: Secondary | ICD-10-CM | POA: Diagnosis not present

## 2019-02-16 DIAGNOSIS — Z96641 Presence of right artificial hip joint: Secondary | ICD-10-CM | POA: Diagnosis not present

## 2019-02-16 DIAGNOSIS — R509 Fever, unspecified: Secondary | ICD-10-CM

## 2019-02-16 DIAGNOSIS — S0990XA Unspecified injury of head, initial encounter: Secondary | ICD-10-CM | POA: Diagnosis not present

## 2019-02-16 DIAGNOSIS — F329 Major depressive disorder, single episode, unspecified: Secondary | ICD-10-CM | POA: Diagnosis present

## 2019-02-16 DIAGNOSIS — M2550 Pain in unspecified joint: Secondary | ICD-10-CM | POA: Diagnosis not present

## 2019-02-16 DIAGNOSIS — R52 Pain, unspecified: Secondary | ICD-10-CM | POA: Diagnosis not present

## 2019-02-16 LAB — SAMPLE TO BLOOD BANK

## 2019-02-16 LAB — BASIC METABOLIC PANEL
Anion gap: 9 (ref 5–15)
BUN: 25 mg/dL — ABNORMAL HIGH (ref 8–23)
CO2: 26 mmol/L (ref 22–32)
Calcium: 8.8 mg/dL — ABNORMAL LOW (ref 8.9–10.3)
Chloride: 105 mmol/L (ref 98–111)
Creatinine, Ser: 0.92 mg/dL (ref 0.61–1.24)
GFR calc Af Amer: >60 mL/min (ref >60)
GFR calc non Af Amer: >60 mL/min (ref >60)
Glucose, Bld: 158 mg/dL — ABNORMAL HIGH (ref 70–99)
Potassium: 4 mmol/L (ref 3.5–5.1)
Sodium: 140 mmol/L (ref 135–145)

## 2019-02-16 LAB — CBC WITH DIFFERENTIAL/PLATELET
Abs Immature Granulocytes: 0.06 10*3/uL (ref 0.00–0.07)
Basophils Absolute: 0 10*3/uL (ref 0.0–0.1)
Basophils Relative: 0 %
Eosinophils Absolute: 0.1 10*3/uL (ref 0.0–0.5)
Eosinophils Relative: 1 %
HCT: 42.8 % (ref 39.0–52.0)
Hemoglobin: 14.6 g/dL (ref 13.0–17.0)
Immature Granulocytes: 1 %
Lymphocytes Relative: 19 %
Lymphs Abs: 1.6 10*3/uL (ref 0.7–4.0)
MCH: 30.5 pg (ref 26.0–34.0)
MCHC: 34.1 g/dL (ref 30.0–36.0)
MCV: 89.4 fL (ref 80.0–100.0)
Monocytes Absolute: 0.6 10*3/uL (ref 0.1–1.0)
Monocytes Relative: 7 %
Neutro Abs: 6.2 10*3/uL (ref 1.7–7.7)
Neutrophils Relative %: 72 %
Platelets: 182 10*3/uL (ref 150–400)
RBC: 4.79 MIL/uL (ref 4.22–5.81)
RDW: 12.8 % (ref 11.5–15.5)
WBC: 8.5 10*3/uL (ref 4.0–10.5)
nRBC: 0 % (ref 0.0–0.2)

## 2019-02-16 LAB — PROTIME-INR
INR: 1.1 (ref 0.8–1.2)
Prothrombin Time: 14 seconds (ref 11.4–15.2)

## 2019-02-16 LAB — SARS CORONAVIRUS 2 BY RT PCR (HOSPITAL ORDER, PERFORMED IN ~~LOC~~ HOSPITAL LAB): SARS Coronavirus 2: NEGATIVE

## 2019-02-16 LAB — APTT: aPTT: 29 seconds (ref 24–36)

## 2019-02-16 MED ORDER — FENTANYL CITRATE (PF) 100 MCG/2ML IJ SOLN
50.0000 ug | Freq: Once | INTRAMUSCULAR | Status: AC
  Administered 2019-02-16: 50 ug via INTRAVENOUS

## 2019-02-16 MED ORDER — PANTOPRAZOLE SODIUM 40 MG PO TBEC
40.0000 mg | DELAYED_RELEASE_TABLET | Freq: Every day | ORAL | Status: DC
  Administered 2019-02-17 – 2019-02-26 (×8): 40 mg via ORAL
  Filled 2019-02-16 (×8): qty 1

## 2019-02-16 MED ORDER — VITAMIN D 25 MCG (1000 UNIT) PO TABS
1000.0000 [IU] | ORAL_TABLET | Freq: Every day | ORAL | Status: DC
  Administered 2019-02-17 – 2019-02-26 (×8): 1000 [IU] via ORAL
  Filled 2019-02-16 (×8): qty 1

## 2019-02-16 MED ORDER — ONDANSETRON HCL 4 MG/2ML IJ SOLN
4.0000 mg | Freq: Once | INTRAMUSCULAR | Status: AC
  Administered 2019-02-16: 4 mg via INTRAVENOUS
  Filled 2019-02-16: qty 2

## 2019-02-16 MED ORDER — ACETAMINOPHEN 650 MG RE SUPP
650.0000 mg | Freq: Four times a day (QID) | RECTAL | Status: DC | PRN
  Filled 2019-02-16: qty 1

## 2019-02-16 MED ORDER — CARBIDOPA-LEVODOPA 25-100 MG PO TABS: 1.0000 | ORAL_TABLET | ORAL | Status: DC

## 2019-02-16 MED ORDER — KETOROLAC TROMETHAMINE 30 MG/ML IJ SOLN
15.0000 mg | Freq: Four times a day (QID) | INTRAMUSCULAR | Status: DC | PRN
  Administered 2019-02-17: 15 mg via INTRAVENOUS
  Filled 2019-02-16: qty 1

## 2019-02-16 MED ORDER — MELATONIN 5 MG PO TABS
10.0000 mg | ORAL_TABLET | Freq: Every day | ORAL | Status: DC
  Administered 2019-02-19 – 2019-02-25 (×7): 10 mg via ORAL
  Filled 2019-02-16 (×11): qty 2

## 2019-02-16 MED ORDER — ONDANSETRON HCL 4 MG/2ML IJ SOLN: 4.0000 mg | Freq: Four times a day (QID) | INTRAMUSCULAR | Status: DC | PRN

## 2019-02-16 MED ORDER — FENTANYL CITRATE (PF) 100 MCG/2ML IJ SOLN
50.0000 ug | Freq: Once | INTRAMUSCULAR | Status: AC
  Administered 2019-02-16: 50 ug via INTRAVENOUS
  Filled 2019-02-16: qty 2

## 2019-02-16 MED ORDER — ONDANSETRON HCL 4 MG PO TABS
4.0000 mg | ORAL_TABLET | Freq: Four times a day (QID) | ORAL | Status: DC | PRN
  Filled 2019-02-16: qty 1

## 2019-02-16 MED ORDER — ESCITALOPRAM OXALATE 10 MG PO TABS
10.0000 mg | ORAL_TABLET | Freq: Every day | ORAL | Status: DC
  Administered 2019-02-17 – 2019-02-26 (×9): 10 mg via ORAL
  Filled 2019-02-16 (×10): qty 1

## 2019-02-16 MED ORDER — MEMANTINE HCL 5 MG PO TABS
10.0000 mg | ORAL_TABLET | Freq: Two times a day (BID) | ORAL | Status: DC
  Administered 2019-02-16 – 2019-02-26 (×18): 10 mg via ORAL
  Filled 2019-02-16 (×11): qty 2
  Filled 2019-02-16: qty 1
  Filled 2019-02-16 (×8): qty 2

## 2019-02-16 MED ORDER — MORPHINE SULFATE (PF) 2 MG/ML IV SOLN: 1.0000 mg | INTRAVENOUS | Status: DC | PRN

## 2019-02-16 MED ORDER — ACETAMINOPHEN 325 MG PO TABS: 650.0000 mg | ORAL_TABLET | Freq: Four times a day (QID) | ORAL | Status: DC | PRN

## 2019-02-16 NOTE — ED Notes (Signed)
Pt wife brought pt appropriate food from cafeteria

## 2019-02-16 NOTE — ED Provider Notes (Signed)
[facility] Emergency Department Provider Note  ____________________________________________   First MD Initiated Contact with Patient 02/16/19 1616     (approximate)  I have reviewed the triage vital signs and the nursing notes.   HISTORY  Chief Complaint Fall    HPI [name] is a 68 y.o. male with a history of Parkinson's who presents for fall.  Patient says legs got wobbly and he fell onto his right hip.  He has severe pain that is constant, nothing makes it better, worse with moving the leg.  He thinks that he might of hit his head as well.  Denies any other tenderness in his extremities or chest or abdomen. No chest pain or sob prior to fall.         Past Medical History:  Diagnosis Date   Arthritis    Bradycardia    Cancer ([place]) 2013   skin cancer   Depression    ptsd   Dysrhythmia    chronic slow heart rate   GERD (gastroesophageal reflux disease)    Headache(784.0)    tension headaches non recent   History of chicken pox    History of kidney stones    passed   Hypertension    treated with HCTZ   Parkinson's disease ([place])    dx'ed 15 years ago   PTSD (post-traumatic stress disorder)    Shortness of breath dyspnea    Sleep apnea    doesn't use C-pap   Varicose veins     Patient Active Problem List   Diagnosis Date Noted   Rib pain on right side 09/25/2018   Skin sore 12/01/2017   Hematoma 10/18/2017   Fall at home 10/18/2017   REM behavioral disorder 11/02/2016   Radicular pain in right arm 10/09/2016   GERD (gastroesophageal reflux disease) 07/31/2016   Trochanteric bursitis 03/03/2016   Left hand pain 10/30/2015   Fracture, finger, multiple sites 10/30/2015   Colon cancer screening 07/23/2015   Healthcare maintenance 07/23/2015   Gout 02/18/2015   Depression 01/06/2015   Irritation of eyelid 08/20/2014   Dupuytren's contracture 08/20/2014   Cough 07/21/2014   Lumbar stenosis  with neurogenic claudication 06/13/2014   Preop cardiovascular exam 05/28/2014   S/P deep brain stimulator placement 05/08/2014   Joint pain 01/22/2014   Medicare annual wellness visit, initial 01/19/2014   Advance care planning 01/19/2014   Parkinson's disease ([place]) 12/13/2013   PTSD (post-traumatic stress disorder) 06/13/2013   Erectile dysfunction 06/13/2013   HLD (hyperlipidemia) 06/13/2013   Hip pain 06/10/2013   Pain in joint, shoulder region 06/10/2013   Right leg swelling 06/03/2013   Essential hypertension 06/03/2013   Bradycardia by electrocardiogram 06/03/2013   Obstructive sleep apnea 03/12/2013   Spinal stenosis, lumbar region, with neurogenic claudication 12/14/2012    Past Surgical History:  Procedure Laterality Date   CHOLECYSTECTOMY N/A 10/22/2014   Procedure: LAPAROSCOPIC CHOLECYSTECTOMY WITH INTRAOPERATIVE CHOLANGIOGRAM;  Surgeon: Dia Crawford III, MD;  Location: [facility] ORS;  Service: General;  Laterality: N/A;   cyst removed      from lip as a child   LUMBAR LAMINECTOMY/DECOMPRESSION MICRODISCECTOMY Bilateral 12/14/2012   Procedure: Bilateral lumbar three-four, four-five decompressive laminotomy/foraminotomy;  Surgeon: Charlie Pitter, MD;  Location: [facility] NEURO ORS;  Service: Neurosurgery;  Laterality: Bilateral;   PULSE GENERATOR IMPLANT Bilateral 12/13/2013   Procedure: Bilateral implantable pulse generator placement;  Surgeon: Erline Levine, MD;  Location: [place] NEURO ORS;  Service: Neurosurgery;  Laterality: Bilateral;  Bilateral implantable pulse  generator placement   skin cancer removed     from ears,   12 lft arm  rt leg 15   SUBTHALAMIC STIMULATOR BATTERY REPLACEMENT Bilateral 07/14/2017   Procedure: BILATERAL IMPLANTED PULSE GENERATOR CHANGE FOR DEEP BRAIN STIMULATOR;  Surgeon: Erline Levine, MD;  Location: [name];  Service: Neurosurgery;  Laterality: Bilateral;   SUBTHALAMIC STIMULATOR INSERTION Bilateral 12/06/2013   Procedure: SUBTHALAMIC STIMULATOR  INSERTION;  Surgeon: Erline Levine, MD;  Location: [place] NEURO ORS;  Service: Neurosurgery;  Laterality: Bilateral;  Bilateral deep brain stimulator placement    Prior to Admission medications   Medication Sig Start Date End Date Taking? Authorizing Provider  carbidopa-levodopa (SINEMET IR) 25-100 MG tablet Take 1 tablet by mouth 3 (three) times daily. 10/09/18   Tonia Ghent, MD  cholecalciferol (VITAMIN D) 1000 UNITS tablet Take 1,000 Units by mouth daily.    [provider]  clonazePAM (KLONOPIN) 0.5 MG tablet Take 0.25 mg by mouth at bedtime.    [provider]  escitalopram (LEXAPRO) 10 MG tablet Take 1 tablet (10 mg total) by mouth daily. 10/09/18   Tonia Ghent, MD  memantine (NAMENDA) 10 MG tablet TAKE 1 TABLET TWICE A DAY 02/11/19   Tonia Ghent, MD  naproxen sodium (ALEVE) 220 MG tablet Take 440 mg by mouth 2 (two) times daily as needed (for pain or headache).    [provider]  omeprazole (PRILOSEC) 20 MG capsule TAKE 1 CAPSULE DAILY 06/19/18   Tonia Ghent, MD    Allergies Klonopin [clonazepam], Morphine and related, and Prednisone  Family History  Problem Relation Age of Onset   Lung cancer Father    Colon cancer Neg Hx    Prostate cancer Neg Hx     Social History Social History   Tobacco Use   Smoking status: Never Smoker   Smokeless tobacco: Former Systems developer  Substance Use Topics   Alcohol use: Yes    Alcohol/week: 0.0 standard drinks    Comment: occasional (twice per month)   Drug use: No      Review of Systems Constitutional: No fever/chills Eyes: No visual changes. ENT: No sore throat. Cardiovascular: No chest pain Respiratory: no SOB Gastrointestinal: No abdominal pain.  No nausea, no vomiting.  No diarrhea.  No constipation. Genitourinary: Negative for dysuria. Musculoskeletal: Negative for back pain. + hip pain  Skin: Negative for rash. Neurological: Negative for headaches, focal weakness or numbness. All  other ROS negative ____________________________________________   PHYSICAL EXAM:  VITAL SIGNS: Blood pressure (!) 162/81, pulse (!) 53, temperature 97.7 F (36.5 C), temperature source Oral, resp. rate 20, height 5\' 9"  (1.753 m), weight 83.9 kg, SpO2 98 %.   Constitutional: Alert and oriented. Well appearing and in no acute distress. Eyes: Conjunctivae are normal. EOMI. Head: Atraumatic. Nose: No congestion/rhinnorhea. Mouth/Throat: Mucous membranes are moist.   Neck: No stridor. Trachea Midline. FROM Cardiovascular: bradycardiac, regular rhythm. Grossly normal heart sounds.  Good peripheral circulation. Respiratory: clear, no increased WOB  Gastrointestinal: Soft and nontender. No distention. No abdominal bruits.  Musculoskeletal: Tenderness R hip with limited ROM due to pain. Good distal pulses.  No joint effusions. Neurologic:  Normal speech and language. No gross focal neurologic deficits are appreciated.  Baseline tremor.  Skin:  Skin is warm, dry and intact. No rash noted. Psychiatric: Mood and affect are normal. Speech and behavior are normal. GU: Deferred   ____________________________________________   LABS (all labs ordered are listed, but only abnormal results are displayed)  Labs Reviewed  SARS CORONAVIRUS 2 BY RT PCR (HOSPITAL ORDER, [place] [facility])  CBC WITH DIFFERENTIAL/PLATELET  BASIC METABOLIC PANEL  PROTIME-INR  APTT   ____________________________________________   ED ECG REPORT I, Vanessa Dillwyn, the attending physician, personally viewed and interpreted this ECG.  Patient endorses having deep brain stimulator that is causing interference on the EKG therefore I am unable to fully interpret it ____________________________________________  RADIOLOGY I, Vanessa , personally viewed and evaluated these images (plain radiographs) as part of my medical decision making, as well as reviewing the written report by the  radiologist.  ED MD interpretation: X-ray of the hip concerning for intertrochanteric fracture.  Official radiology report(s): Dg Chest 1 View  Result Date: 02/16/2019 CLINICAL DATA:  Hip fracture EXAM: CHEST  1 VIEW COMPARISON:  September 25, 2018 FINDINGS: The heart size and mediastinal contours are within normal limits. Both lungs are clear. The visualized skeletal structures are unremarkable. IMPRESSION: No active disease. Electronically Signed   By: Dorise Bullion III M.D   On: 02/16/2019 17:03   Ct Head Wo Contrast  Result Date: 02/16/2019 CLINICAL DATA:  Lost balance and fell approximately 3 p.m. today. EXAM: CT HEAD WITHOUT CONTRAST CT CERVICAL SPINE WITHOUT CONTRAST TECHNIQUE: Multidetector CT imaging of the head and cervical spine was performed following the standard protocol without intravenous contrast. Multiplanar CT image reconstructions of the cervical spine were also generated. COMPARISON:  Head CT 10/11/2017 FINDINGS: CT HEAD FINDINGS Brain: Bilateral thalamic stimulators are noted. Stable ventriculomegaly and cerebral atrophy. No CT findings for acute intracranial process such as hemorrhage or infarction. No extra-axial fluid collections are identified. No mass lesions. Vascular: Advanced vascular calcifications for age but no hyperdense vessels or aneurysm. Skull: No skull fracture or bone lesions. Sinuses/Orbits: The paranasal sinuses and mastoid air cells are clear. The globes are intact. Other: No scalp lesions or hematoma. CT CERVICAL SPINE FINDINGS Alignment: Normal Skull base and vertebrae: No acute fracture. No primary bone lesion or focal pathologic process. Soft tissues and spinal canal: No prevertebral fluid or swelling. No visible canal hematoma. Disc levels: The spinal canal is fairly generous. No significant spinal stenosis. Mild uncinate spurring changes and facet disease but no significant foraminal stenosis. Upper chest: The visualized lung apices are grossly clear. Aortic  calcifications are noted. Other: Age advanced carotid artery calcifications are noted bilaterally. There also moderate vertebral artery calcifications. IMPRESSION: 1. No acute intracranial findings or skull fracture. 2. Stable position and appearance of the bilateral thalamic stimulators. 3. Normal alignment of the cervical vertebral bodies and no acute cervical spine fracture. 4. Age advanced vascular calcifications. Electronically Signed   By: Marijo Sanes M.D.   On: 02/16/2019 17:16   Dg Hip Unilat W Or Wo Pelvis 2-3 Views Right  Result Date: 02/16/2019 CLINICAL DATA:  Pain after fall EXAM: DG HIP (WITH OR WITHOUT PELVIS) 2-3V RIGHT COMPARISON:  None. FINDINGS: There is an intertrochanteric fracture on the right without significant displacement. No dislocation. Surgical hardware seen in the lower lumbar spine. Vascular calcifications are seen in the femoral vessels. No other fractures are noted. IMPRESSION: Fracture through the right intertrochanteric region without significant displacement. Electronically Signed   By: Dorise Bullion III M.D   On: 02/16/2019 17:02    ____________________________________________   PROCEDURES  Procedure(s) performed (including Critical Care):  Procedures   ____________________________________________   INITIAL IMPRESSION / ASSESSMENT AND PLAN / ED COURSE   [name] was evaluated in Emergency Department on 02/16/2019 for the symptoms  described in the history of present illness. He was evaluated in the context of the global COVID-19 pandemic, which necessitated consideration that the patient might be at risk for infection with the SARS-CoV-2 virus that causes COVID-19. Institutional protocols and algorithms that pertain to the evaluation of patients at risk for COVID-19 are in a state of rapid change based on information released by regulatory bodies including the CDC and federal and state organizations. These policies and algorithms were followed  during the patient's care in the ED.     Patient presents with a fall.  This may concerning for fracture versus dislocation of the right hip.  Patient has a good distal pulse.  We will see a CT head given he is unsure if he hit his head as well CT cervical to evaluate for cervical fracture.  Sounds like mechanical fall but will get basic labs.  X-ray concerning for right intertrochanter fracture.  CT head/cervical negative for acute findings.  Discussed with Dr. Mack Guise and recommend admission to hospital.   Discussed hospital team and they will admit.    ____________________________________________   FINAL CLINICAL IMPRESSION(S) / ED DIAGNOSES   Final diagnoses:  Closed fracture of right hip, initial encounter ([place])     MEDICATIONS GIVEN DURING THIS VISIT:  Medications  fentaNYL (SUBLIMAZE) injection 50 mcg (has no administration in time range)  ondansetron (ZOFRAN) injection 4 mg (has no administration in time range)     ED Discharge Orders    None       Note:  This document was prepared using Dragon voice recognition software and may include unintentional dictation errors.   Vanessa [place], MD 02/16/19 [phone]

## 2019-02-16 NOTE — H&P (Signed)
[facility] NAME: [name]    MR#:  [id number]  DATE OF BIRTH:  [birth year]  DATE OF ADMISSION:  02/16/2019  PRIMARY CARE PHYSICIAN: Tonia Ghent, MD   REQUESTING/REFERRING PHYSICIAN: Dr. Marjean Donna  CHIEF COMPLAINT:   Chief Complaint  Patient presents with   Fall    HISTORY OF PRESENT ILLNESS:  [name]  is a 68 y.o. male with a known history of essential hypertension, Parkinson's disease status post brain stimulator, bradycardia, PTSD, obstructive sleep apnea who presents to the hospital after mechanical fall noted to have a right hip fracture.  Patient was in his usual state of health and was walking in his backyard without his walker when he lost his footing and fell to the ground.  He did not lose consciousness, he had no head trauma he had no prodromal symptoms of chest pain, shortness of breath palpitations or any other associated symptoms.  He presented to the ER and underwent x-ray which showed right hip fracture.  Hospitalist services were contacted for admission.  Patient denies any fevers, shortness of breath, cough, loss of taste, any recent travel history and patient's COVID-19 test still pending.  PAST MEDICAL HISTORY:   Past Medical History:  Diagnosis Date   Arthritis    Bradycardia    Cancer ([place]) 2013   skin cancer   Depression    ptsd   Dysrhythmia    chronic slow heart rate   GERD (gastroesophageal reflux disease)    Headache(784.0)    tension headaches non recent   History of chicken pox    History of kidney stones    passed   Hypertension    treated with HCTZ   Parkinson's disease ([place])    dx'ed 15 years ago   PTSD (post-traumatic stress disorder)    Shortness of breath dyspnea    Sleep apnea    doesn't use C-pap   Varicose veins     PAST SURGICAL HISTORY:   Past Surgical History:  Procedure Laterality Date   CHOLECYSTECTOMY N/A 10/22/2014   Procedure:  LAPAROSCOPIC CHOLECYSTECTOMY WITH INTRAOPERATIVE CHOLANGIOGRAM;  Surgeon: Dia Crawford III, MD;  Location: [facility] ORS;  Service: General;  Laterality: N/A;   cyst removed      from lip as a child   LUMBAR LAMINECTOMY/DECOMPRESSION MICRODISCECTOMY Bilateral 12/14/2012   Procedure: Bilateral lumbar three-four, four-five decompressive laminotomy/foraminotomy;  Surgeon: Charlie Pitter, MD;  Location: [facility];  Service: Neurosurgery;  Laterality: Bilateral;   PULSE GENERATOR IMPLANT Bilateral 12/13/2013   Procedure: Bilateral implantable pulse generator placement;  Surgeon: Erline Levine, MD;  Location: [place] [facility];  Service: Neurosurgery;  Laterality: Bilateral;  Bilateral implantable pulse generator placement   skin cancer removed     from ears,   12 lft arm  rt leg 14   SUBTHALAMIC STIMULATOR BATTERY REPLACEMENT Bilateral 07/14/2017   Procedure: BILATERAL IMPLANTED PULSE GENERATOR CHANGE FOR DEEP BRAIN STIMULATOR;  Surgeon: Erline Levine, MD;  Location: [facility];  Service: Neurosurgery;  Laterality: Bilateral;   SUBTHALAMIC STIMULATOR INSERTION Bilateral 12/06/2013   Procedure: SUBTHALAMIC STIMULATOR INSERTION;  Surgeon: Erline Levine, MD;  Location: [place] [facility];  Service: Neurosurgery;  Laterality: Bilateral;  Bilateral deep brain stimulator placement    SOCIAL HISTORY:   Social History   Tobacco Use   Smoking status: Never Smoker   Smokeless tobacco: Former Systems developer  Substance Use Topics   Alcohol use: Yes    Alcohol/week: 0.0 standard drinks  Comment: occasional (twice per month)    FAMILY HISTORY:   Family History  Problem Relation Age of Onset   Lung cancer Father    Colon cancer Neg Hx    Prostate cancer Neg Hx     DRUG ALLERGIES:   Allergies  Allergen Reactions   Klonopin [Clonazepam] Other (See Comments)    Worsening mood/depression   Morphine And Related Other (See Comments)    hallucinations   Prednisone Other (See Comments)    Unclear reaction    REVIEW  OF SYSTEMS:   Review of Systems  Constitutional: Negative for fever and weight loss.  HENT: Negative for congestion, nosebleeds and tinnitus.   Eyes: Negative for blurred vision, double vision and redness.  Respiratory: Negative for cough, hemoptysis and shortness of breath.   Cardiovascular: Negative for chest pain, orthopnea, leg swelling and PND.  Gastrointestinal: Negative for abdominal pain, diarrhea, melena, nausea and vomiting.  Genitourinary: Negative for dysuria, hematuria and urgency.  Musculoskeletal: Positive for falls and joint pain (Right Hip).  Neurological: Negative for dizziness, tingling, sensory change, focal weakness, seizures, weakness and headaches.  Endo/Heme/Allergies: Negative for polydipsia. Does not bruise/bleed easily.  Psychiatric/Behavioral: Negative for depression and memory loss. The patient is not nervous/anxious.     MEDICATIONS AT HOME:   Prior to Admission medications   Medication Sig Start Date End Date Taking? Authorizing Provider  carbidopa-levodopa (SINEMET IR) 25-100 MG tablet Take 1 tablet by mouth 3 (three) times daily. 10/09/18   Tonia Ghent, MD  cholecalciferol (VITAMIN D) 1000 UNITS tablet Take 1,000 Units by mouth daily.    [provider]  clonazePAM (KLONOPIN) 0.5 MG tablet Take 0.25 mg by mouth at bedtime.    [provider]  escitalopram (LEXAPRO) 10 MG tablet Take 1 tablet (10 mg total) by mouth daily. 10/09/18   Tonia Ghent, MD  memantine (NAMENDA) 10 MG tablet TAKE 1 TABLET TWICE A DAY 02/11/19   Tonia Ghent, MD  naproxen sodium (ALEVE) 220 MG tablet Take 440 mg by mouth 2 (two) times daily as needed (for pain or headache).    [provider]  omeprazole (PRILOSEC) 20 MG capsule TAKE 1 CAPSULE DAILY 06/19/18   Tonia Ghent, MD      VITAL SIGNS:  Blood pressure (!) 162/81, pulse (!) 53, temperature 97.7 F (36.5 C), temperature source Oral, resp. rate 20, height 5\' 9"  (1.753 m), weight 83.9  kg, SpO2 98 %.  PHYSICAL EXAMINATION:  Physical Exam  GENERAL:  68 y.o.-year-old patient lying in the bed in no acute distress.  EYES: Pupils equal, round, reactive to light and accommodation. No scleral icterus. Extraocular muscles intact.  HEENT: Head atraumatic, normocephalic. Oropharynx and nasopharynx clear. No oropharyngeal erythema, moist oral mucosa  NECK:  Supple, no jugular venous distention. No thyroid enlargement, no tenderness.  LUNGS: Normal breath sounds bilaterally, no wheezing, rales, rhonchi. No use of accessory muscles of respiration.  CARDIOVASCULAR: S1, S2 RRR. No murmurs, rubs, gallops, clicks.  ABDOMEN: Soft, nontender, nondistended. Bowel sounds present. No organomegaly or mass.  EXTREMITIES: No pedal edema, cyanosis, or clubbing. + 2 pedal & radial pulses b/l.  Right lower extremity is externally rotated and shortened due to the fracture NEUROLOGIC: Cranial nerves II through XII are intact. No focal Motor or sensory deficits appreciated b/l PSYCHIATRIC: The patient is alert and oriented x 3.  SKIN: No obvious rash, lesion, or ulcer.   LABORATORY PANEL:   CBC Recent Labs  Lab 02/16/19 1640  WBC  8.5  HGB 14.6  HCT 42.8  PLT 182   ------------------------------------------------------------------------------------------------------------------  Chemistries  Recent Labs  Lab 02/16/19 1640  NA 140  K 4.0  CL 105  CO2 26  GLUCOSE 158*  BUN 25*  CREATININE 0.92  CALCIUM 8.8*   ------------------------------------------------------------------------------------------------------------------  Cardiac Enzymes No results for input(s): TROPONINI in the last 168 hours. ------------------------------------------------------------------------------------------------------------------  RADIOLOGY:  Dg Chest 1 View  Result Date: 02/16/2019 CLINICAL DATA:  Hip fracture EXAM: CHEST  1 VIEW COMPARISON:  September 25, 2018 FINDINGS: The heart size and mediastinal  contours are within normal limits. Both lungs are clear. The visualized skeletal structures are unremarkable. IMPRESSION: No active disease. Electronically Signed   By: Dorise Bullion III M.D   On: 02/16/2019 17:03   Ct Head Wo Contrast  Result Date: 02/16/2019 CLINICAL DATA:  Lost balance and fell approximately 3 p.m. today. EXAM: CT HEAD WITHOUT CONTRAST CT CERVICAL SPINE WITHOUT CONTRAST TECHNIQUE: Multidetector CT imaging of the head and cervical spine was performed following the standard protocol without intravenous contrast. Multiplanar CT image reconstructions of the cervical spine were also generated. COMPARISON:  Head CT 10/11/2017 FINDINGS: CT HEAD FINDINGS Brain: Bilateral thalamic stimulators are noted. Stable ventriculomegaly and cerebral atrophy. No CT findings for acute intracranial process such as hemorrhage or infarction. No extra-axial fluid collections are identified. No mass lesions. Vascular: Advanced vascular calcifications for age but no hyperdense vessels or aneurysm. Skull: No skull fracture or bone lesions. Sinuses/Orbits: The paranasal sinuses and mastoid air cells are clear. The globes are intact. Other: No scalp lesions or hematoma. CT CERVICAL SPINE FINDINGS Alignment: Normal Skull base and vertebrae: No acute fracture. No primary bone lesion or focal pathologic process. Soft tissues and spinal canal: No prevertebral fluid or swelling. No visible canal hematoma. Disc levels: The spinal canal is fairly generous. No significant spinal stenosis. Mild uncinate spurring changes and facet disease but no significant foraminal stenosis. Upper chest: The visualized lung apices are grossly clear. Aortic calcifications are noted. Other: Age advanced carotid artery calcifications are noted bilaterally. There also moderate vertebral artery calcifications. IMPRESSION: 1. No acute intracranial findings or skull fracture. 2. Stable position and appearance of the bilateral thalamic stimulators. 3.  Normal alignment of the cervical vertebral bodies and no acute cervical spine fracture. 4. Age advanced vascular calcifications. Electronically Signed   By: Marijo Sanes M.D.   On: 02/16/2019 17:16   Dg Hip Unilat W Or Wo Pelvis 2-3 Views Right  Result Date: 02/16/2019 CLINICAL DATA:  Pain after fall EXAM: DG HIP (WITH OR WITHOUT PELVIS) 2-3V RIGHT COMPARISON:  None. FINDINGS: There is an intertrochanteric fracture on the right without significant displacement. No dislocation. Surgical hardware seen in the lower lumbar spine. Vascular calcifications are seen in the femoral vessels. No other fractures are noted. IMPRESSION: Fracture through the right intertrochanteric region without significant displacement. Electronically Signed   By: Dorise Bullion III M.D   On: 02/16/2019 17:02     IMPRESSION AND PLAN:   68 y.o. male with a known history of essential hypertension, Parkinson's disease status post brain stimulator, bradycardia, PTSD, obstructive sleep apnea who presents to the hospital after mechanical fall noted to have a right hip fracture.  1.  Preoperative cardiovascular examination-patient is a low to moderate risk for noncardiac surgery.  No absolute contraindications to surgery at this time. -Patient's EKG is quite artifactual given his brain stimulator.  He also has no significant cardiac history. -Patient is stable for surgery for tomorrow and notify  orthopedics.  2.  Status post fall and right hip fracture-patient has been cleared from the medical perspective for surgery tomorrow. -Orthopedics has been consulted.  Dr. Mack Guise to see the patient.  3.  History of Parkinson's disease-continue Sinemet.  4.  Anxiety/depression-continue Lexapro, Klonopin.  5.  GERD-continue Protonix.    All the records are reviewed and case discussed with ED provider. Management plans discussed with the patient, family and they are in agreement.  CODE STATUS: Full code  TOTAL TIME TAKING CARE  OF THIS PATIENT: 40 minutes.    Henreitta Leber M.D on 02/16/2019 at 5:57 PM  Between 7am to 6pm - Pager - [phone]  After 6pm go to [email or web address] - password [email or web address] [facility]  Office  [phone]  CC: Primary care physician; Tonia Ghent, MD

## 2019-02-16 NOTE — ED Triage Notes (Signed)
Pt arrives via em sfrom home. Pt reports fall around 3pm. Pt reports he lost his balance causing him to fall, unsure if he hit head, denies any loc. C/O right hip pain states unable to move right hip. Pt unable to straighten leg due to pain, pulse present, cap refill wnl. A&o x 4

## 2019-02-16 NOTE — Progress Notes (Signed)
   [name] Day: 0 days [name] is a 68 y.o. male with past medical history of Parkinson's disease, anxiety/depression, PTSD, GERD presenting with Fall Noted to have a right hip fracture.  Discussed CODE STATUS with patient and patient's wife at bedside.  Explained to them the difference between a DNR and a full code.  Patient does not want to be kept alive on machines prolonged but wants to be initially resuscitated.  Patient is a full code for now.  Advance care planning discussed with patient  with additional Family at bedside. All questions in regards to overall condition and expected prognosis answered. The decision was made to continue current code status  CODE STATUS: full Time spent: 16 minutes

## 2019-02-16 NOTE — Consult Note (Signed)
ORTHOPAEDIC CONSULTATION  REQUESTING PHYSICIAN: Henreitta Leber, MD  Chief Complaint: right hip pain s/p fall  HPI: [name] is a 68 y.o. male with Parkinson's disease who complains of right hip pain after a fall on concrete today.  Patient is seen in the ED.   His wife is at the bedside.  She states that he fell onto his right side but he has pain in the left hip.  He denies any other areas of pain.  Patient denies numbness in the right lower extremity.  Past Medical History:  Diagnosis Date  . Arthritis   . Bradycardia   . Cancer [facility]) 2013   skin cancer  . Depression    ptsd  . Dysrhythmia    chronic slow heart rate  . GERD (gastroesophageal reflux disease)   . Headache(784.0)    tension headaches non recent  . History of chicken pox   . History of kidney stones    passed  . Hypertension    treated with HCTZ  . Parkinson's disease ([place])    dx'ed 15 years ago  . PTSD (post-traumatic stress disorder)   . Shortness of breath dyspnea   . Sleep apnea    doesn't use C-pap  . Varicose veins    Past Surgical History:  Procedure Laterality Date  . CHOLECYSTECTOMY N/A 10/22/2014   Procedure: LAPAROSCOPIC CHOLECYSTECTOMY WITH INTRAOPERATIVE CHOLANGIOGRAM;  Surgeon: Dia Crawford III, MD;  Location: [facility] ORS;  Service: General;  Laterality: N/A;  . cyst removed      from lip as a child  . LUMBAR LAMINECTOMY/DECOMPRESSION MICRODISCECTOMY Bilateral 12/14/2012   Procedure: Bilateral lumbar three-four, four-five decompressive laminotomy/foraminotomy;  Surgeon: Charlie Pitter, MD;  Location: [place] [facility] ORS;  Service: Neurosurgery;  Laterality: Bilateral;  . PULSE GENERATOR IMPLANT Bilateral 12/13/2013   Procedure: Bilateral implantable pulse generator placement;  Surgeon: Erline Levine, MD;  Location: [place] [facility] ORS;  Service: Neurosurgery;  Laterality: Bilateral;  Bilateral implantable pulse generator placement  . skin cancer removed     from ears,   12 lft arm  rt leg 14  . SUBTHALAMIC  STIMULATOR BATTERY REPLACEMENT Bilateral 07/14/2017   Procedure: BILATERAL IMPLANTED PULSE GENERATOR CHANGE FOR DEEP BRAIN STIMULATOR;  Surgeon: Erline Levine, MD;  Location: [place];  Service: Neurosurgery;  Laterality: Bilateral;  . SUBTHALAMIC STIMULATOR INSERTION Bilateral 12/06/2013   Procedure: SUBTHALAMIC STIMULATOR INSERTION;  Surgeon: Erline Levine, MD;  Location: [place] NEURO ORS;  Service: Neurosurgery;  Laterality: Bilateral;  Bilateral deep brain stimulator placement   Social History   Socioeconomic History  . Marital status: Married    Spouse name: Not on file  . Number of children: Not on file  . Years of education: Not on file  . Highest education level: Not on file  Occupational History  . Occupation: disabled    Comment: 2001, [employer]    Comment: search and rescue helicopter  Social Needs  . Financial resource strain: Not on file  . Food insecurity    Worry: Not on file    Inability: Not on file  . Transportation needs    Medical: Not on file    Non-medical: Not on file  Tobacco Use  . Smoking status: Never Smoker  . Smokeless tobacco: Former Network engineer and Sexual Activity  . Alcohol use: Yes    Alcohol/week: 0.0 standard drinks    Comment: occasional (twice per month)  . Drug use: No  . Sexual activity: Not Currently  Lifestyle  . Physical activity  Days per week: Not on file    Minutes per session: Not on file  . Stress: Not on file  Relationships  . Social Herbalist on phone: Not on file    Gets together: Not on file    Attends religious service: Not on file    Active member of club or organization: Not on file    Attends meetings of clubs or organizations: Not on file    Relationship status: Not on file  Other Topics Concern  . 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  Prev search and rescue work   3 kids from prev relationship.    Married 2014   Family History  Problem Relation  Age of Onset  . Lung cancer Father   . Colon cancer Neg Hx   . Prostate cancer Neg Hx    Allergies  Allergen Reactions  . Klonopin [Clonazepam] Other (See Comments)    Worsening mood/depression  . Morphine And Related Other (See Comments)    hallucinations  . Prednisone Other (See Comments)    Unclear reaction   Prior to Admission medications   Medication Sig Start Date End Date Taking? Authorizing Provider  carbidopa-levodopa (SINEMET IR) 25-100 MG tablet Take 1 tablet by mouth 3 (three) times daily. Patient taking differently: Take 1-2 tablets by mouth See admin instructions. Take 2 tablets by mouth every morning, 1 tablet at lunchtime and 1 tablet at dinnertime 10/09/18  Yes Tonia Ghent, MD  cholecalciferol (VITAMIN D) 1000 UNITS tablet Take 1,000 Units by mouth daily.   Yes [provider]  escitalopram (LEXAPRO) 10 MG tablet Take 1 tablet (10 mg total) by mouth daily. 10/09/18  Yes Tonia Ghent, MD  Melatonin 10 MG TABS Take 10 mg by mouth at bedtime.   Yes [provider]  memantine (NAMENDA) 10 MG tablet TAKE 1 TABLET TWICE A DAY Patient taking differently: Take 10 mg by mouth 2 (two) times daily.  02/11/19  Yes Tonia Ghent, MD  naproxen sodium (ALEVE) 220 MG tablet Take 440 mg by mouth 2 (two) times daily as needed (for pain or headache).   Yes [provider]  omeprazole (PRILOSEC) 20 MG capsule TAKE 1 CAPSULE DAILY Patient taking differently: Take 20 mg by mouth daily.  06/19/18  Yes Tonia Ghent, MD   Dg Chest 1 View  Result Date: 02/16/2019 CLINICAL DATA:  Hip fracture EXAM: CHEST  1 VIEW COMPARISON:  September 25, 2018 FINDINGS: The heart size and mediastinal contours are within normal limits. Both lungs are clear. The visualized skeletal structures are unremarkable. IMPRESSION: No active disease. Electronically Signed   By: Dorise Bullion III M.D   On: 02/16/2019 17:03   Ct Head Wo Contrast  Result Date: 02/16/2019 CLINICAL DATA:   Lost balance and fell approximately 3 p.m. today. EXAM: CT HEAD WITHOUT CONTRAST CT CERVICAL SPINE WITHOUT CONTRAST TECHNIQUE: Multidetector CT imaging of the head and cervical spine was performed following the standard protocol without intravenous contrast. Multiplanar CT image reconstructions of the cervical spine were also generated. COMPARISON:  Head CT 10/11/2017 FINDINGS: CT HEAD FINDINGS Brain: Bilateral thalamic stimulators are noted. Stable ventriculomegaly and cerebral atrophy. No CT findings for acute intracranial process such as hemorrhage or infarction. No extra-axial fluid collections are identified. No mass lesions. Vascular: Advanced vascular calcifications for age but no hyperdense vessels or aneurysm. Skull: No skull fracture or bone lesions.  Sinuses/Orbits: The paranasal sinuses and mastoid air cells are clear. The globes are intact. Other: No scalp lesions or hematoma. CT CERVICAL SPINE FINDINGS Alignment: Normal Skull base and vertebrae: No acute fracture. No primary bone lesion or focal pathologic process. Soft tissues and spinal canal: No prevertebral fluid or swelling. No visible canal hematoma. Disc levels: The spinal canal is fairly generous. No significant spinal stenosis. Mild uncinate spurring changes and facet disease but no significant foraminal stenosis. Upper chest: The visualized lung apices are grossly clear. Aortic calcifications are noted. Other: Age advanced carotid artery calcifications are noted bilaterally. There also moderate vertebral artery calcifications. IMPRESSION: 1. No acute intracranial findings or skull fracture. 2. Stable position and appearance of the bilateral thalamic stimulators. 3. Normal alignment of the cervical vertebral bodies and no acute cervical spine fracture. 4. Age advanced vascular calcifications. Electronically Signed   By: Marijo Sanes M.D.   On: 02/16/2019 17:16   Ct Cervical Spine Wo Contrast  Result Date: 02/16/2019 CLINICAL DATA:  Lost  balance and fell approximately 3 p.m. today. EXAM: CT HEAD WITHOUT CONTRAST CT CERVICAL SPINE WITHOUT CONTRAST TECHNIQUE: Multidetector CT imaging of the head and cervical spine was performed following the standard protocol without intravenous contrast. Multiplanar CT image reconstructions of the cervical spine were also generated. COMPARISON:  Head CT 10/11/2017 FINDINGS: CT HEAD FINDINGS Brain: Bilateral thalamic stimulators are noted. Stable ventriculomegaly and cerebral atrophy. No CT findings for acute intracranial process such as hemorrhage or infarction. No extra-axial fluid collections are identified. No mass lesions. Vascular: Advanced vascular calcifications for age but no hyperdense vessels or aneurysm. Skull: No skull fracture or bone lesions. Sinuses/Orbits: The paranasal sinuses and mastoid air cells are clear. The globes are intact. Other: No scalp lesions or hematoma. CT CERVICAL SPINE FINDINGS Alignment: Normal Skull base and vertebrae: No acute fracture. No primary bone lesion or focal pathologic process. Soft tissues and spinal canal: No prevertebral fluid or swelling. No visible canal hematoma. Disc levels: The spinal canal is fairly generous. No significant spinal stenosis. Mild uncinate spurring changes and facet disease but no significant foraminal stenosis. Upper chest: The visualized lung apices are grossly clear. Aortic calcifications are noted. Other: Age advanced carotid artery calcifications are noted bilaterally. There also moderate vertebral artery calcifications. IMPRESSION: 1. No acute intracranial findings or skull fracture. 2. Stable position and appearance of the bilateral thalamic stimulators. 3. Normal alignment of the cervical vertebral bodies and no acute cervical spine fracture. 4. Age advanced vascular calcifications. Electronically Signed   By: Marijo Sanes M.D.   On: 02/16/2019 17:16   Dg Hip Unilat W Or Wo Pelvis 2-3 Views Right  Result Date: 02/16/2019 CLINICAL  DATA:  Pain after fall EXAM: DG HIP (WITH OR WITHOUT PELVIS) 2-3V RIGHT COMPARISON:  None. FINDINGS: There is an intertrochanteric fracture on the right without significant displacement. No dislocation. Surgical hardware seen in the lower lumbar spine. Vascular calcifications are seen in the femoral vessels. No other fractures are noted. IMPRESSION: Fracture through the right intertrochanteric region without significant displacement. Electronically Signed   By: Dorise Bullion III M.D   On: 02/16/2019 17:02    Positive ROS: All other systems have been reviewed and were otherwise negative with the exception of those mentioned in the HPI and as above.  Physical Exam: General: Alert, no acute distress  MUSCULOSKELETAL: Right lower extremity:  Skin intact, no ecchymosis.  Compartments soft.  Mild external rotation, no significant shortening.  Palpable pedal pulses.  Intact sensation to light  touch.  Can flex and extend toes and ankle.  Assessment: Right intertrochanteric hip fracture  Plan: I reviewed with the patient and his wife the details of his fracture.   I have recommended intramedullary fixation for this injury.  I discussed the details of the operation and the post-op course.    I discussed the risks and benefits of surgery. They understand the risks include but are not limited to infection, bleeding requiring blood transfusion, nerve or blood vessel injury, joint stiffness or loss of motion, persistent pain, weakness or instability, malunion, nonunion and hardware failure and the need for further surgery. Medical risks include but are not limited to DVT and pulmonary embolism, myocardial infarction, stroke, pneumonia, respiratory failure and death. Patient understood these risks and wished to proceed.    Thornton Park, MD    02/16/2019 6:24 PM

## 2019-02-17 ENCOUNTER — Encounter
Admission: EM | Disposition: A | Discharge status: Home / Self Care | Payer: Self-pay | Source: Home / Self Care | Attending: Internal Medicine

## 2019-02-17 DIAGNOSIS — S72001G Fracture of unspecified part of neck of right femur, subsequent encounter for closed fracture with delayed healing: Secondary | ICD-10-CM

## 2019-02-17 DIAGNOSIS — F339 Major depressive disorder, recurrent, unspecified: Secondary | ICD-10-CM

## 2019-02-17 DIAGNOSIS — K219 Gastro-esophageal reflux disease without esophagitis: Secondary | ICD-10-CM

## 2019-02-17 DIAGNOSIS — E782 Mixed hyperlipidemia: Secondary | ICD-10-CM

## 2019-02-17 LAB — TYPE AND SCREEN
ABO/RH(D): A NEG
Antibody Screen: NEGATIVE

## 2019-02-17 LAB — URINALYSIS, COMPLETE (UACMP) WITH MICROSCOPIC
Bacteria, UA: NONE SEEN
Bilirubin Urine: NEGATIVE
Glucose, UA: NEGATIVE mg/dL
Ketones, ur: NEGATIVE mg/dL
Leukocytes,Ua: NEGATIVE
Nitrite: NEGATIVE
Protein, ur: NEGATIVE mg/dL
Specific Gravity, Urine: 1.02 (ref 1.005–1.030)
Squamous Epithelial / HPF: NONE SEEN (ref 0–5)
pH: 5 (ref 5.0–8.0)

## 2019-02-17 LAB — BASIC METABOLIC PANEL
Anion gap: 7 (ref 5–15)
BUN: 22 mg/dL (ref 8–23)
CO2: 26 mmol/L (ref 22–32)
Calcium: 8.6 mg/dL — ABNORMAL LOW (ref 8.9–10.3)
Chloride: 107 mmol/L (ref 98–111)
Creatinine, Ser: 0.98 mg/dL (ref 0.61–1.24)
GFR calc Af Amer: >60 mL/min (ref >60)
GFR calc non Af Amer: >60 mL/min (ref >60)
Glucose, Bld: 124 mg/dL — ABNORMAL HIGH (ref 70–99)
Potassium: 3.4 mmol/L — ABNORMAL LOW (ref 3.5–5.1)
Sodium: 140 mmol/L (ref 135–145)

## 2019-02-17 LAB — SURGICAL PCR SCREEN
MRSA, PCR: NEGATIVE
Staphylococcus aureus: NEGATIVE

## 2019-02-17 LAB — CBC
HCT: 40.4 % (ref 39.0–52.0)
Hemoglobin: 14.1 g/dL (ref 13.0–17.0)
MCH: 30.7 pg (ref 26.0–34.0)
MCHC: 34.9 g/dL (ref 30.0–36.0)
MCV: 88 fL (ref 80.0–100.0)
Platelets: 164 10*3/uL (ref 150–400)
RBC: 4.59 MIL/uL (ref 4.22–5.81)
RDW: 12.6 % (ref 11.5–15.5)
WBC: 10.9 10*3/uL — ABNORMAL HIGH (ref 4.0–10.5)
nRBC: 0 % (ref 0.0–0.2)

## 2019-02-17 SURGERY — FIXATION, FRACTURE, INTERTROCHANTERIC, WITH INTRAMEDULLARY ROD
Anesthesia: Spinal | Laterality: Right

## 2019-02-17 MED ORDER — ENOXAPARIN SODIUM 40 MG/0.4ML ~~LOC~~ SOLN
40.0000 mg | Freq: Once | SUBCUTANEOUS | Status: AC
  Administered 2019-02-17: 40 mg via SUBCUTANEOUS
  Filled 2019-02-17: qty 0.4

## 2019-02-17 MED ORDER — IPRATROPIUM-ALBUTEROL 0.5-2.5 (3) MG/3ML IN SOLN: 3.0000 mL | RESPIRATORY_TRACT | Status: DC | PRN

## 2019-02-17 MED ORDER — CARBIDOPA-LEVODOPA 25-100 MG PO TABS
1.0000 | ORAL_TABLET | Freq: Two times a day (BID) | ORAL | Status: DC
  Administered 2019-02-17 – 2019-02-26 (×17): 1 via ORAL
  Filled 2019-02-17 (×20): qty 1

## 2019-02-17 MED ORDER — GENTAMICIN SULFATE 40 MG/ML IJ SOLN
INTRAMUSCULAR | Status: AC
  Filled 2019-02-17: qty 2

## 2019-02-17 MED ORDER — SENNOSIDES-DOCUSATE SODIUM 8.6-50 MG PO TABS
2.0000 | ORAL_TABLET | Freq: Every evening | ORAL | Status: DC | PRN
  Filled 2019-02-17: qty 2

## 2019-02-17 MED ORDER — OXYCODONE HCL 5 MG PO TABS
5.0000 mg | ORAL_TABLET | ORAL | Status: DC | PRN
  Administered 2019-02-18: 5 mg via ORAL
  Filled 2019-02-17: qty 1

## 2019-02-17 MED ORDER — PROPOFOL 10 MG/ML IV BOLUS
INTRAVENOUS | Status: AC
  Filled 2019-02-17: qty 20

## 2019-02-17 MED ORDER — POTASSIUM CHLORIDE 10 MEQ/100ML IV SOLN
10.0000 meq | INTRAVENOUS | Status: AC
  Administered 2019-02-17 (×3): 10 meq via INTRAVENOUS
  Filled 2019-02-17 (×4): qty 100

## 2019-02-17 MED ORDER — KETAMINE HCL 50 MG/ML IJ SOLN
INTRAMUSCULAR | Status: AC
  Filled 2019-02-17: qty 10

## 2019-02-17 MED ORDER — CARBIDOPA-LEVODOPA 25-100 MG PO TABS
2.0000 | ORAL_TABLET | Freq: Every day | ORAL | Status: DC
  Administered 2019-02-19 – 2019-02-26 (×7): 2 via ORAL
  Filled 2019-02-17 (×9): qty 2

## 2019-02-17 MED ORDER — CHLORHEXIDINE GLUCONATE CLOTH 2 % EX PADS
6.0000 | MEDICATED_PAD | Freq: Every day | CUTANEOUS | Status: DC
  Administered 2019-02-17 – 2019-02-18 (×2): 6 via TOPICAL

## 2019-02-17 MED ORDER — POLYETHYLENE GLYCOL 3350 17 G PO PACK
17.0000 g | PACK | Freq: Every day | ORAL | Status: DC | PRN
  Administered 2019-02-19 – 2019-02-21 (×2): 17 g via ORAL
  Filled 2019-02-17 (×3): qty 1

## 2019-02-17 MED ORDER — HYDROMORPHONE HCL 1 MG/ML IJ SOLN
0.5000 mg | INTRAMUSCULAR | Status: DC | PRN
  Administered 2019-02-17 (×2): 0.5 mg via INTRAVENOUS
  Filled 2019-02-17 (×2): qty 1

## 2019-02-17 MED ORDER — SODIUM CHLORIDE 0.9 % IV SOLN
Freq: Once | INTRAVENOUS | Status: AC
  Administered 2019-02-17: 10:00:00 via INTRAVENOUS

## 2019-02-17 MED ORDER — SODIUM CHLORIDE 0.9 % IV SOLN: INTRAVENOUS | Status: DC

## 2019-02-17 MED ORDER — HYDRALAZINE HCL 20 MG/ML IJ SOLN
10.0000 mg | INTRAMUSCULAR | Status: DC | PRN
  Filled 2019-02-17: qty 0.5

## 2019-02-17 SURGICAL SUPPLY — 33 items
BNDG COHESIVE 6X5 TAN STRL LF (GAUZE/BANDAGES/DRESSINGS) ×4 IMPLANT
CANISTER SUCT 1200ML W/VALVE (MISCELLANEOUS) ×2 IMPLANT
COVER WAND RF STERILE (DRAPES) ×2 IMPLANT
CRADLE LAMINECT ARM (MISCELLANEOUS) ×2 IMPLANT
DRAPE 3/4 80X56 (DRAPES) ×4 IMPLANT
DRAPE SURG 17X11 SM STRL (DRAPES) ×4 IMPLANT
DRAPE U-SHAPE 47X51 STRL (DRAPES) ×2 IMPLANT
DRSG OPSITE POSTOP 3X4 (GAUZE/BANDAGES/DRESSINGS) ×4 IMPLANT
DRSG OPSITE POSTOP 4X14 (GAUZE/BANDAGES/DRESSINGS) IMPLANT
DRSG OPSITE POSTOP 4X6 (GAUZE/BANDAGES/DRESSINGS) ×2 IMPLANT
DURAPREP 26ML APPLICATOR (WOUND CARE) ×4 IMPLANT
ELECT REM PT RETURN 9FT ADLT (ELECTROSURGICAL) ×2
ELECTRODE REM PT RTRN 9FT ADLT (ELECTROSURGICAL) ×1 IMPLANT
GLOVE BIOGEL PI IND STRL 9 (GLOVE) ×1 IMPLANT
GLOVE BIOGEL PI INDICATOR 9 (GLOVE) ×1
GLOVE SURG 9.0 ORTHO LTXF (GLOVE) ×4 IMPLANT
GOWN STRL REUS TWL 2XL XL LVL4 (GOWN DISPOSABLE) ×2 IMPLANT
GOWN STRL REUS W/ TWL LRG LVL3 (GOWN DISPOSABLE) ×1 IMPLANT
GOWN STRL REUS W/TWL LRG LVL3 (GOWN DISPOSABLE) ×1
HEMOVAC 400CC 10FR (MISCELLANEOUS) IMPLANT
KIT TURNOVER CYSTO (KITS) ×2 IMPLANT
MAT ABSORB  FLUID 56X50 GRAY (MISCELLANEOUS) ×1
MAT ABSORB FLUID 56X50 GRAY (MISCELLANEOUS) ×1 IMPLANT
NS IRRIG 1000ML POUR BTL (IV SOLUTION) ×2 IMPLANT
PACK HIP COMPR (MISCELLANEOUS) ×2 IMPLANT
STAPLER SKIN PROX 35W (STAPLE) ×2 IMPLANT
SUCTION FRAZIER HANDLE 10FR (MISCELLANEOUS) ×1
SUCTION TUBE FRAZIER 10FR DISP (MISCELLANEOUS) ×1 IMPLANT
SUT VIC AB 0 CT1 36 (SUTURE) ×4 IMPLANT
SUT VIC AB 2-0 CT1 27 (SUTURE) ×1
SUT VIC AB 2-0 CT1 TAPERPNT 27 (SUTURE) ×1 IMPLANT
SUT VICRYL 0 AB UR-6 (SUTURE) ×2 IMPLANT
SYR 30ML LL (SYRINGE) ×2 IMPLANT

## 2019-02-17 NOTE — ED Notes (Signed)
Pt tremulous while sleeping

## 2019-02-17 NOTE — ED Notes (Signed)
Pt assisted with in bed urinal - unable to produce urine

## 2019-02-17 NOTE — ED Notes (Signed)
Per Dr Mack Guise: foley and 100 ml per hour maint fluids

## 2019-02-17 NOTE — ED Notes (Signed)
Pt consent filed electronically and printout in chart

## 2019-02-17 NOTE — Progress Notes (Signed)
PROGRESS NOTE    [name]  [id number] DOB: 02/04/1951 DOA: 02/16/2019 PCP: Tonia Ghent, MD   Brief Narrative:  68 year old with history of HTN, Perkins disease status post brain stimulator, bradycardia, PTSD, obstructive sleep apnea presented after mechanical fall with right hip fracture.  Plans for ORIF by orthopedic.   Assessment & Plan:   Active Problems:   Essential hypertension   Erectile dysfunction   HLD (hyperlipidemia)   Depression   GERD (gastroesophageal reflux disease)   Closed right hip fracture (HCC)  Right hip fracture, closed status post fall -Plans for or per orthopedic. -Supportive care.  PT/OT postop -Pain control, bowel regimen Moderate risk surgery given his risk factors.  No contraindications noted.  History of Parkinson's disease -Continue Sinemet  History of anxiety/depression -Continue Lexapro and Klonopin  GERD -PPI   DVT prophylaxis: Postoperative prophylaxis per surgical team Code Status: Full code Family Communication: Wife at bedside Disposition Plan: Maintain hospital stay for surgical repair of his hip  Consultants:   Orthopedic  Procedures:   Plans for ORIF  Antimicrobials:      Subjective: Reporting of right hip pain as expected.  Denies any chest pain or other complaints.  Review of Systems Otherwise negative except as per HPI, including: General: Denies fever, chills, night sweats or unintended weight loss. Resp: Denies cough, wheezing, shortness of breath. Cardiac: Denies chest pain, palpitations, orthopnea, paroxysmal nocturnal dyspnea. GI: Denies abdominal pain, nausea, vomiting, diarrhea or constipation GU: Denies dysuria, frequency, hesitancy or incontinence MS: Right hip pain Neuro: Denies headache, neurologic deficits (focal weakness, numbness, tingling), abnormal gait Psych: Denies anxiety, depression, SI/HI/AVH Skin: Denies new rashes or lesions ID: Denies sick contacts, exotic exposures,  travel  Objective: Vitals:   02/17/19 0100 02/17/19 0130 02/17/19 0330 02/17/19 0401  BP: 133/66 (!) 121/98  135/71  Pulse:    99  Resp: (!) 21 (!) 22 18 (!) 22  Temp:    99.2 F (37.3 C)  TempSrc:    Oral  SpO2:    91%  Weight:      Height:        Intake/Output Summary (Last 24 hours) at 02/17/2019 1113 Last data filed at 02/17/2019 0224 Gross per 24 hour  Intake --  Output 150 ml  Net -150 ml   Filed Weights   02/16/19 1630  Weight: 83.9 kg    Examination:  General exam: Appears calm and comfortable  Respiratory system: Clear to auscultation. Respiratory effort normal. Cardiovascular system: S1 & S2 heard, RRR. No JVD, murmurs, rubs, gallops or clicks. No pedal edema. Gastrointestinal system: Abdomen is nondistended, soft and nontender. No organomegaly or masses felt. Normal bowel sounds heard. Central nervous system: Alert and oriented. No focal neurological deficits. Extremities: Limited range of motion of right hip secondary to pain. Skin: No rashes, lesions or ulcers Psychiatry: Judgement and insight appear normal. Mood & affect appropriate.     Data Reviewed:   CBC: Recent Labs  Lab 02/16/19 1640 02/17/19 0655  WBC 8.5 10.9*  NEUTROABS 6.2  --   HGB 14.6 14.1  HCT 42.8 40.4  MCV 89.4 88.0  PLT 182 [id number]   Basic Metabolic Panel: Recent Labs  Lab 02/16/19 1640 02/17/19 0655  NA 140 140  K 4.0 3.4*  CL 105 107  CO2 26 26  GLUCOSE 158* 124*  BUN 25* 22  CREATININE 0.92 0.98  CALCIUM 8.8* 8.6*   GFR: Estimated Creatinine Clearance: 71.1 mL/min (by C-G formula based on SCr of 0.98  mg/dL). Liver Function Tests: No results for input(s): AST, ALT, ALKPHOS, BILITOT, PROT, ALBUMIN in the last 168 hours. No results for input(s): LIPASE, AMYLASE in the last 168 hours. No results for input(s): AMMONIA in the last 168 hours. Coagulation Profile: Recent Labs  Lab 02/16/19 1640  INR 1.1   Cardiac Enzymes: No results for input(s): CKTOTAL, CKMB,  CKMBINDEX, TROPONINI in the last 168 hours. BNP (last 3 results) No results for input(s): PROBNP in the last 8760 hours. HbA1C: No results for input(s): HGBA1C in the last 72 hours. CBG: No results for input(s): GLUCAP in the last 168 hours. Lipid Profile: No results for input(s): CHOL, HDL, LDLCALC, TRIG, CHOLHDL, LDLDIRECT in the last 72 hours. Thyroid Function Tests: No results for input(s): TSH, T4TOTAL, FREET4, T3FREE, THYROIDAB in the last 72 hours. Anemia Panel: No results for input(s): VITAMINB12, FOLATE, FERRITIN, TIBC, IRON, RETICCTPCT in the last 72 hours. Sepsis Labs: No results for input(s): PROCALCITON, LATICACIDVEN in the last 168 hours.  Recent Results (from the past 240 hour(s))  SARS Coronavirus 2 by RT PCR (hospital order, performed in [facility] hospital lab) Nasopharyngeal Nasopharyngeal Swab     Status: None   Collection Time: 02/16/19  4:40 PM   Specimen: Nasopharyngeal Swab  Result Value Ref Range Status   SARS Coronavirus 2 NEGATIVE NEGATIVE Final    Comment: (NOTE) If result is NEGATIVE SARS-CoV-2 target nucleic acids are NOT DETECTED. The SARS-CoV-2 RNA is generally detectable in upper and lower  respiratory specimens during the acute phase of infection. The lowest  concentration of SARS-CoV-2 viral copies this assay can detect is 250  copies / mL. A negative result does not preclude SARS-CoV-2 infection  and should not be used as the sole basis for treatment or other  patient management decisions.  A negative result may occur with  improper specimen collection / handling, submission of specimen other  than nasopharyngeal swab, presence of viral mutation(s) within the  areas targeted by this assay, and inadequate number of viral copies  (<250 copies / mL). A negative result must be combined with clinical  observations, patient history, and epidemiological information. If result is POSITIVE SARS-CoV-2 target nucleic acids are DETECTED. The SARS-CoV-2  RNA is generally detectable in upper and lower  respiratory specimens dur ing the acute phase of infection.  Positive  results are indicative of active infection with SARS-CoV-2.  Clinical  correlation with patient history and other diagnostic information is  necessary to determine patient infection status.  Positive results do  not rule out bacterial infection or co-infection with other viruses. If result is PRESUMPTIVE POSTIVE SARS-CoV-2 nucleic acids MAY BE PRESENT.   A presumptive positive result was obtained on the submitted specimen  and confirmed on repeat testing.  While 2019 novel coronavirus  (SARS-CoV-2) nucleic acids may be present in the submitted sample  additional confirmatory testing may be necessary for epidemiological  and / or clinical management purposes  to differentiate between  SARS-CoV-2 and other Sarbecovirus currently known to infect humans.  If clinically indicated additional testing with an alternate test  methodology [phone]) is advised. The SARS-CoV-2 RNA is generally  detectable in upper and lower respiratory sp ecimens during the acute  phase of infection. The expected result is Negative. Fact Sheet for Patients:  StrictlyIdeas.no Fact Sheet for Healthcare Providers: BankingDealers.co.za This test is not yet approved or cleared by the [place] FDA and has been authorized for detection and/or diagnosis of SARS-CoV-2 by FDA under an Emergency Use Authorization (  EUA).  This EUA will remain in effect (meaning this test can be used) for the duration of the COVID-19 declaration under Section 564(b)(1) of the Act, 21 U.S.C. section 360bbb-3(b)(1), unless the authorization is terminated or revoked sooner. Performed at [facility], [address], [place], [place] [address]          Radiology Studies: Dg Chest 1 View  Result Date: 02/16/2019 CLINICAL DATA:  Hip fracture EXAM: CHEST  1  VIEW COMPARISON:  September 25, 2018 FINDINGS: The heart size and mediastinal contours are within normal limits. Both lungs are clear. The visualized skeletal structures are unremarkable. IMPRESSION: No active disease. Electronically Signed   By: Dorise Bullion III M.D   On: 02/16/2019 17:03   Ct Head Wo Contrast  Result Date: 02/16/2019 CLINICAL DATA:  Lost balance and fell approximately 3 p.m. today. EXAM: CT HEAD WITHOUT CONTRAST CT CERVICAL SPINE WITHOUT CONTRAST TECHNIQUE: Multidetector CT imaging of the head and cervical spine was performed following the standard protocol without intravenous contrast. Multiplanar CT image reconstructions of the cervical spine were also generated. COMPARISON:  Head CT 10/11/2017 FINDINGS: CT HEAD FINDINGS Brain: Bilateral thalamic stimulators are noted. Stable ventriculomegaly and cerebral atrophy. No CT findings for acute intracranial process such as hemorrhage or infarction. No extra-axial fluid collections are identified. No mass lesions. Vascular: Advanced vascular calcifications for age but no hyperdense vessels or aneurysm. Skull: No skull fracture or bone lesions. Sinuses/Orbits: The paranasal sinuses and mastoid air cells are clear. The globes are intact. Other: No scalp lesions or hematoma. CT CERVICAL SPINE FINDINGS Alignment: Normal Skull base and vertebrae: No acute fracture. No primary bone lesion or focal pathologic process. Soft tissues and spinal canal: No prevertebral fluid or swelling. No visible canal hematoma. Disc levels: The spinal canal is fairly generous. No significant spinal stenosis. Mild uncinate spurring changes and facet disease but no significant foraminal stenosis. Upper chest: The visualized lung apices are grossly clear. Aortic calcifications are noted. Other: Age advanced carotid artery calcifications are noted bilaterally. There also moderate vertebral artery calcifications. IMPRESSION: 1. No acute intracranial findings or skull fracture. 2.  Stable position and appearance of the bilateral thalamic stimulators. 3. Normal alignment of the cervical vertebral bodies and no acute cervical spine fracture. 4. Age advanced vascular calcifications. Electronically Signed   By: Marijo Sanes M.D.   On: 02/16/2019 17:16   Ct Cervical Spine Wo Contrast  Result Date: 02/16/2019 CLINICAL DATA:  Lost balance and fell approximately 3 p.m. today. EXAM: CT HEAD WITHOUT CONTRAST CT CERVICAL SPINE WITHOUT CONTRAST TECHNIQUE: Multidetector CT imaging of the head and cervical spine was performed following the standard protocol without intravenous contrast. Multiplanar CT image reconstructions of the cervical spine were also generated. COMPARISON:  Head CT 10/11/2017 FINDINGS: CT HEAD FINDINGS Brain: Bilateral thalamic stimulators are noted. Stable ventriculomegaly and cerebral atrophy. No CT findings for acute intracranial process such as hemorrhage or infarction. No extra-axial fluid collections are identified. No mass lesions. Vascular: Advanced vascular calcifications for age but no hyperdense vessels or aneurysm. Skull: No skull fracture or bone lesions. Sinuses/Orbits: The paranasal sinuses and mastoid air cells are clear. The globes are intact. Other: No scalp lesions or hematoma. CT CERVICAL SPINE FINDINGS Alignment: Normal Skull base and vertebrae: No acute fracture. No primary bone lesion or focal pathologic process. Soft tissues and spinal canal: No prevertebral fluid or swelling. No visible canal hematoma. Disc levels: The spinal canal is fairly generous. No significant spinal stenosis. Mild uncinate spurring changes  and facet disease but no significant foraminal stenosis. Upper chest: The visualized lung apices are grossly clear. Aortic calcifications are noted. Other: Age advanced carotid artery calcifications are noted bilaterally. There also moderate vertebral artery calcifications. IMPRESSION: 1. No acute intracranial findings or skull fracture. 2. Stable  position and appearance of the bilateral thalamic stimulators. 3. Normal alignment of the cervical vertebral bodies and no acute cervical spine fracture. 4. Age advanced vascular calcifications. Electronically Signed   By: Marijo Sanes M.D.   On: 02/16/2019 17:16   Dg Hip Unilat W Or Wo Pelvis 2-3 Views Right  Result Date: 02/16/2019 CLINICAL DATA:  Pain after fall EXAM: DG HIP (WITH OR WITHOUT PELVIS) 2-3V RIGHT COMPARISON:  None. FINDINGS: There is an intertrochanteric fracture on the right without significant displacement. No dislocation. Surgical hardware seen in the lower lumbar spine. Vascular calcifications are seen in the femoral vessels. No other fractures are noted. IMPRESSION: Fracture through the right intertrochanteric region without significant displacement. Electronically Signed   By: Dorise Bullion III M.D   On: 02/16/2019 17:02        Scheduled Meds:  carbidopa-levodopa  1 tablet Oral BID AC   [START ON 02/18/2019] carbidopa-levodopa  2 tablet Oral Q breakfast   cholecalciferol  1,000 Units Oral Daily   escitalopram  10 mg Oral Daily   Melatonin  10 mg Oral QHS   memantine  10 mg Oral BID   pantoprazole  40 mg Oral Daily   Continuous Infusions:  potassium chloride 10 mEq (02/17/19 1016)     LOS: 1 day   Time spent= 35 mins    Sierra Spargo Arsenio Loader, MD [facility]  If 7PM-7AM, please contact night-coverage  02/17/2019, 11:13 AM

## 2019-02-17 NOTE — Progress Notes (Addendum)
Subjective:  Patient is seen in the ER where he has spent the night due to nursing shortage is on the orthopedic floor.  Patient is complaining of right hip pain.  His wife is in the room.  Patient is requesting a hospital bed as the stretcher is causing his low back to bother him..  Objective:   VITALS:   Vitals:   02/17/19 0100 02/17/19 0130 02/17/19 0330 02/17/19 0401  BP: 133/66 (!) 121/98  135/71  Pulse:    99  Resp: (!) 21 (!) 22 18 (!) 22  Temp:    99.2 F (37.3 C)  TempSrc:    Oral  SpO2:    91%  Weight:      Height:        PHYSICAL EXAM: Right lower extremity: Neurovascular intact Sensation intact distally Intact pulses distally Dorsiflexion/Plantar flexion intact No cellulitis present Compartment soft  LABS  Results for orders placed or performed during the hospital encounter of 02/16/19 (from the past 24 hour(s))  CBC with Differential     Status: None   Collection Time: 02/16/19  4:40 PM  Result Value Ref Range   WBC 8.5 4.0 - 10.5 K/uL   RBC 4.79 4.22 - 5.81 MIL/uL   Hemoglobin 14.6 13.0 - 17.0 g/dL   HCT 42.8 39.0 - 52.0 %   MCV 89.4 80.0 - 100.0 fL   MCH 30.5 26.0 - 34.0 pg   MCHC 34.1 30.0 - 36.0 g/dL   RDW 12.8 11.5 - 15.5 %   Platelets 182 150 - 400 K/uL   nRBC 0.0 0.0 - 0.2 %   Neutrophils Relative % 72 %   Neutro Abs 6.2 1.7 - 7.7 K/uL   Lymphocytes Relative 19 %   Lymphs Abs 1.6 0.7 - 4.0 K/uL   Monocytes Relative 7 %   Monocytes Absolute 0.6 0.1 - 1.0 K/uL   Eosinophils Relative 1 %   Eosinophils Absolute 0.1 0.0 - 0.5 K/uL   Basophils Relative 0 %   Basophils Absolute 0.0 0.0 - 0.1 K/uL   Immature Granulocytes 1 %   Abs Immature Granulocytes 0.06 0.00 - 0.07 K/uL  Basic metabolic panel     Status: Abnormal   Collection Time: 02/16/19  4:40 PM  Result Value Ref Range   Sodium 140 135 - 145 mmol/L   Potassium 4.0 3.5 - 5.1 mmol/L   Chloride 105 98 - 111 mmol/L   CO2 26 22 - 32 mmol/L   Glucose, Bld 158 (H) 70 - 99 mg/dL   BUN 25  (H) 8 - 23 mg/dL   Creatinine, Ser 0.92 0.61 - 1.24 mg/dL   Calcium 8.8 (L) 8.9 - 10.3 mg/dL   GFR calc non Af Amer >60 >60 mL/min   GFR calc Af Amer >60 >60 mL/min   Anion gap 9 5 - 15  Protime-INR     Status: None   Collection Time: 02/16/19  4:40 PM  Result Value Ref Range   Prothrombin Time 14.0 11.4 - 15.2 seconds   INR 1.1 0.8 - 1.2  APTT     Status: None   Collection Time: 02/16/19  4:40 PM  Result Value Ref Range   aPTT 29 24 - 36 seconds  SARS Coronavirus 2 by RT PCR (hospital order, performed in [place] hospital lab) Nasopharyngeal Nasopharyngeal Swab     Status: None   Collection Time: 02/16/19  4:40 PM   Specimen: Nasopharyngeal Swab  Result Value Ref Range   SARS Coronavirus  2 NEGATIVE NEGATIVE  Sample to Blood Bank     Status: None   Collection Time: 02/16/19  4:40 PM  Result Value Ref Range   Blood Bank Specimen SAMPLE AVAILABLE FOR TESTING    Sample Expiration      02/19/2019,2359 Performed at [facility], [address] [place], [place] [id number]   Basic metabolic panel     Status: Abnormal   Collection Time: 02/17/19  6:55 AM  Result Value Ref Range   Sodium 140 135 - 145 mmol/L   Potassium 3.4 (L) 3.5 - 5.1 mmol/L   Chloride 107 98 - 111 mmol/L   CO2 26 22 - 32 mmol/L   Glucose, Bld 124 (H) 70 - 99 mg/dL   BUN 22 8 - 23 mg/dL   Creatinine, Ser 0.98 0.61 - 1.24 mg/dL   Calcium 8.6 (L) 8.9 - 10.3 mg/dL   GFR calc non Af Amer >60 >60 mL/min   GFR calc Af Amer >60 >60 mL/min   Anion gap 7 5 - 15  CBC     Status: Abnormal   Collection Time: 02/17/19  6:55 AM  Result Value Ref Range   WBC 10.9 (H) 4.0 - 10.5 K/uL   RBC 4.59 4.22 - 5.81 MIL/uL   Hemoglobin 14.1 13.0 - 17.0 g/dL   HCT 40.4 39.0 - 52.0 %   MCV 88.0 80.0 - 100.0 fL   MCH 30.7 26.0 - 34.0 pg   MCHC 34.9 30.0 - 36.0 g/dL   RDW 12.6 11.5 - 15.5 %   Platelets 164 150 - 400 K/uL   nRBC 0.0 0.0 - 0.2 %  Urinalysis, Complete w Microscopic     Status: Abnormal   Collection  Time: 02/17/19  6:55 AM  Result Value Ref Range   Color, Urine YELLOW (A) YELLOW   APPearance CLEAR (A) CLEAR   Specific Gravity, Urine 1.020 1.005 - 1.030   pH 5.0 5.0 - 8.0   Glucose, UA NEGATIVE NEGATIVE mg/dL   Hgb urine dipstick SMALL (A) NEGATIVE   Bilirubin Urine NEGATIVE NEGATIVE   Ketones, ur NEGATIVE NEGATIVE mg/dL   Protein, ur NEGATIVE NEGATIVE mg/dL   Nitrite NEGATIVE NEGATIVE   Leukocytes,Ua NEGATIVE NEGATIVE   RBC / HPF 0-5 0 - 5 RBC/hpf   WBC, UA 0-5 0 - 5 WBC/hpf   Bacteria, UA NONE SEEN NONE SEEN   Squamous Epithelial / LPF NONE SEEN 0 - 5   Mucus PRESENT     Dg Chest 1 View  Result Date: 02/16/2019 CLINICAL DATA:  Hip fracture EXAM: CHEST  1 VIEW COMPARISON:  September 25, 2018 FINDINGS: The heart size and mediastinal contours are within normal limits. Both lungs are clear. The visualized skeletal structures are unremarkable. IMPRESSION: No active disease. Electronically Signed   By: Dorise Bullion III M.D   On: 02/16/2019 17:03   Ct Head Wo Contrast  Result Date: 02/16/2019 CLINICAL DATA:  Lost balance and fell approximately 3 p.m. today. EXAM: CT HEAD WITHOUT CONTRAST CT CERVICAL SPINE WITHOUT CONTRAST TECHNIQUE: Multidetector CT imaging of the head and cervical spine was performed following the standard protocol without intravenous contrast. Multiplanar CT image reconstructions of the cervical spine were also generated. COMPARISON:  Head CT 10/11/2017 FINDINGS: CT HEAD FINDINGS Brain: Bilateral thalamic stimulators are noted. Stable ventriculomegaly and cerebral atrophy. No CT findings for acute intracranial process such as hemorrhage or infarction. No extra-axial fluid collections are identified. No mass lesions. Vascular: Advanced vascular calcifications for age but no hyperdense  vessels or aneurysm. Skull: No skull fracture or bone lesions. Sinuses/Orbits: The paranasal sinuses and mastoid air cells are clear. The globes are intact. Other: No scalp lesions or hematoma.  CT CERVICAL SPINE FINDINGS Alignment: Normal Skull base and vertebrae: No acute fracture. No primary bone lesion or focal pathologic process. Soft tissues and spinal canal: No prevertebral fluid or swelling. No visible canal hematoma. Disc levels: The spinal canal is fairly generous. No significant spinal stenosis. Mild uncinate spurring changes and facet disease but no significant foraminal stenosis. Upper chest: The visualized lung apices are grossly clear. Aortic calcifications are noted. Other: Age advanced carotid artery calcifications are noted bilaterally. There also moderate vertebral artery calcifications. IMPRESSION: 1. No acute intracranial findings or skull fracture. 2. Stable position and appearance of the bilateral thalamic stimulators. 3. Normal alignment of the cervical vertebral bodies and no acute cervical spine fracture. 4. Age advanced vascular calcifications. Electronically Signed   By: Marijo Sanes M.D.   On: 02/16/2019 17:16   Ct Cervical Spine Wo Contrast  Result Date: 02/16/2019 CLINICAL DATA:  Lost balance and fell approximately 3 p.m. today. EXAM: CT HEAD WITHOUT CONTRAST CT CERVICAL SPINE WITHOUT CONTRAST TECHNIQUE: Multidetector CT imaging of the head and cervical spine was performed following the standard protocol without intravenous contrast. Multiplanar CT image reconstructions of the cervical spine were also generated. COMPARISON:  Head CT 10/11/2017 FINDINGS: CT HEAD FINDINGS Brain: Bilateral thalamic stimulators are noted. Stable ventriculomegaly and cerebral atrophy. No CT findings for acute intracranial process such as hemorrhage or infarction. No extra-axial fluid collections are identified. No mass lesions. Vascular: Advanced vascular calcifications for age but no hyperdense vessels or aneurysm. Skull: No skull fracture or bone lesions. Sinuses/Orbits: The paranasal sinuses and mastoid air cells are clear. The globes are intact. Other: No scalp lesions or hematoma. CT  CERVICAL SPINE FINDINGS Alignment: Normal Skull base and vertebrae: No acute fracture. No primary bone lesion or focal pathologic process. Soft tissues and spinal canal: No prevertebral fluid or swelling. No visible canal hematoma. Disc levels: The spinal canal is fairly generous. No significant spinal stenosis. Mild uncinate spurring changes and facet disease but no significant foraminal stenosis. Upper chest: The visualized lung apices are grossly clear. Aortic calcifications are noted. Other: Age advanced carotid artery calcifications are noted bilaterally. There also moderate vertebral artery calcifications. IMPRESSION: 1. No acute intracranial findings or skull fracture. 2. Stable position and appearance of the bilateral thalamic stimulators. 3. Normal alignment of the cervical vertebral bodies and no acute cervical spine fracture. 4. Age advanced vascular calcifications. Electronically Signed   By: Marijo Sanes M.D.   On: 02/16/2019 17:16   Dg Hip Unilat W Or Wo Pelvis 2-3 Views Right  Result Date: 02/16/2019 CLINICAL DATA:  Pain after fall EXAM: DG HIP (WITH OR WITHOUT PELVIS) 2-3V RIGHT COMPARISON:  None. FINDINGS: There is an intertrochanteric fracture on the right without significant displacement. No dislocation. Surgical hardware seen in the lower lumbar spine. Vascular calcifications are seen in the femoral vessels. No other fractures are noted. IMPRESSION: Fracture through the right intertrochanteric region without significant displacement. Electronically Signed   By: Dorise Bullion III M.D   On: 02/16/2019 17:02    Assessment/Plan: Day of Surgery   Active Problems:   Essential hypertension   Erectile dysfunction   HLD (hyperlipidemia)   Depression   GERD (gastroesophageal reflux disease)   Closed right hip fracture ([place])  The patient surgery is being delayed due to a bed shortage in the hospital.  There is no room on the orthopedic floor to take care of him postop at this time.   Patient will remain in the ER until a bed is available on the orthopedic floor.  I will arrange for the patient to get a hospital bed instead of a stretcher.  Patient may eat today and hopefully will be able to get on the operating room schedule tomorrow.  Patient will be n.p.o. after midnight.  Patient will receive a dose of Lovenox today.  A Foley catheter will be ordered as the patient is on bedrest.    Thornton Park , MD 02/17/2019, 2:58 PM

## 2019-02-17 NOTE — ED Notes (Signed)
Pt assisted with urinal

## 2019-02-18 ENCOUNTER — Inpatient Hospital Stay: Payer: Medicare PPO | Admitting: Anesthesiology

## 2019-02-18 ENCOUNTER — Ambulatory Visit: Payer: Medicare PPO | Admitting: Psychology

## 2019-02-18 ENCOUNTER — Inpatient Hospital Stay: Payer: Medicare PPO

## 2019-02-18 ENCOUNTER — Encounter: Payer: Self-pay | Admitting: *Deleted

## 2019-02-18 ENCOUNTER — Encounter
Admission: EM | Disposition: A | Discharge status: Home / Self Care | Payer: Self-pay | Source: Home / Self Care | Attending: Internal Medicine

## 2019-02-18 HISTORY — PX: INTRAMEDULLARY (IM) NAIL INTERTROCHANTERIC: SHX5875

## 2019-02-18 LAB — BASIC METABOLIC PANEL
Anion gap: 8 (ref 5–15)
BUN: 21 mg/dL (ref 8–23)
CO2: 26 mmol/L (ref 22–32)
Calcium: 8.4 mg/dL — ABNORMAL LOW (ref 8.9–10.3)
Chloride: 105 mmol/L (ref 98–111)
Creatinine, Ser: 0.87 mg/dL (ref 0.61–1.24)
GFR calc Af Amer: >60 mL/min (ref >60)
GFR calc non Af Amer: >60 mL/min (ref >60)
Glucose, Bld: 134 mg/dL — ABNORMAL HIGH (ref 70–99)
Potassium: 3.9 mmol/L (ref 3.5–5.1)
Sodium: 139 mmol/L (ref 135–145)

## 2019-02-18 LAB — CBC
HCT: 39 % (ref 39.0–52.0)
Hemoglobin: 13.1 g/dL (ref 13.0–17.0)
MCH: 30 pg (ref 26.0–34.0)
MCHC: 33.6 g/dL (ref 30.0–36.0)
MCV: 89.4 fL (ref 80.0–100.0)
Platelets: 150 10*3/uL (ref 150–400)
RBC: 4.36 MIL/uL (ref 4.22–5.81)
RDW: 12.8 % (ref 11.5–15.5)
WBC: 8.8 10*3/uL (ref 4.0–10.5)
nRBC: 0 % (ref 0.0–0.2)

## 2019-02-18 LAB — MAGNESIUM: Magnesium: 2.1 mg/dL (ref 1.7–2.4)

## 2019-02-18 LAB — HIV ANTIBODY (ROUTINE TESTING W REFLEX): HIV Screen 4th Generation wRfx: NONREACTIVE

## 2019-02-18 SURGERY — FIXATION, FRACTURE, INTERTROCHANTERIC, WITH INTRAMEDULLARY ROD
Anesthesia: Spinal

## 2019-02-18 MED ORDER — BUPIVACAINE HCL (PF) 0.25 % IJ SOLN
INTRAMUSCULAR | Status: AC
  Filled 2019-02-18: qty 30

## 2019-02-18 MED ORDER — BUPIVACAINE HCL (PF) 0.25 % IJ SOLN
INTRAMUSCULAR | Status: DC | PRN
  Administered 2019-02-18: 30 mL

## 2019-02-18 MED ORDER — FENTANYL CITRATE (PF) 100 MCG/2ML IJ SOLN
INTRAMUSCULAR | Status: DC | PRN
  Administered 2019-02-18 (×3): 25 ug via INTRAVENOUS
  Administered 2019-02-18: 50 ug via INTRAVENOUS
  Administered 2019-02-18: 25 ug via INTRAVENOUS

## 2019-02-18 MED ORDER — ACETAMINOPHEN 325 MG PO TABS
325.0000 mg | ORAL_TABLET | Freq: Four times a day (QID) | ORAL | Status: DC | PRN
  Administered 2019-02-20 – 2019-02-25 (×5): 650 mg via ORAL
  Filled 2019-02-18 (×6): qty 2

## 2019-02-18 MED ORDER — POTASSIUM CHLORIDE IN NACL 20-0.9 MEQ/L-% IV SOLN
INTRAVENOUS | Status: DC
  Filled 2019-02-18 (×3): qty 1000

## 2019-02-18 MED ORDER — CEFAZOLIN SODIUM 1 G IJ SOLR
INTRAMUSCULAR | Status: AC
  Filled 2019-02-18: qty 20

## 2019-02-18 MED ORDER — MIDAZOLAM HCL 5 MG/5ML IJ SOLN
INTRAMUSCULAR | Status: DC | PRN
  Administered 2019-02-18 (×2): 1 mg via INTRAVENOUS

## 2019-02-18 MED ORDER — ONDANSETRON HCL 4 MG/2ML IJ SOLN: 4.0000 mg | Freq: Four times a day (QID) | INTRAMUSCULAR | Status: DC | PRN

## 2019-02-18 MED ORDER — SEVOFLURANE IN SOLN
RESPIRATORY_TRACT | Status: AC
  Filled 2019-02-18: qty 250

## 2019-02-18 MED ORDER — PROPOFOL 500 MG/50ML IV EMUL
INTRAVENOUS | Status: AC
  Filled 2019-02-18: qty 50

## 2019-02-18 MED ORDER — MENTHOL 3 MG MT LOZG
1.0000 | LOZENGE | OROMUCOSAL | Status: DC | PRN
  Filled 2019-02-18: qty 9

## 2019-02-18 MED ORDER — FENTANYL CITRATE (PF) 100 MCG/2ML IJ SOLN
INTRAMUSCULAR | Status: AC
  Filled 2019-02-18: qty 2

## 2019-02-18 MED ORDER — ONDANSETRON HCL 4 MG/2ML IJ SOLN
INTRAMUSCULAR | Status: AC
  Filled 2019-02-18: qty 2

## 2019-02-18 MED ORDER — ALUM & MAG HYDROXIDE-SIMETH 200-200-20 MG/5ML PO SUSP: 30.0000 mL | ORAL | Status: DC | PRN

## 2019-02-18 MED ORDER — BISACODYL 10 MG RE SUPP
10.0000 mg | Freq: Every day | RECTAL | Status: DC | PRN
  Administered 2019-02-22: 10 mg via RECTAL
  Filled 2019-02-18: qty 1

## 2019-02-18 MED ORDER — DOCUSATE SODIUM 100 MG PO CAPS
100.0000 mg | ORAL_CAPSULE | Freq: Two times a day (BID) | ORAL | Status: DC
  Administered 2019-02-18 – 2019-02-26 (×13): 100 mg via ORAL
  Filled 2019-02-18 (×14): qty 1

## 2019-02-18 MED ORDER — OXYCODONE HCL 5 MG PO TABS
5.0000 mg | ORAL_TABLET | ORAL | Status: DC | PRN
  Administered 2019-02-18: 10 mg via ORAL
  Administered 2019-02-25 – 2019-02-26 (×2): 5 mg via ORAL
  Filled 2019-02-18 (×3): qty 1
  Filled 2019-02-18: qty 2

## 2019-02-18 MED ORDER — ENOXAPARIN SODIUM 40 MG/0.4ML ~~LOC~~ SOLN
40.0000 mg | SUBCUTANEOUS | Status: DC
  Administered 2019-02-19 – 2019-02-26 (×8): 40 mg via SUBCUTANEOUS
  Filled 2019-02-18 (×8): qty 0.4

## 2019-02-18 MED ORDER — FENTANYL CITRATE (PF) 100 MCG/2ML IJ SOLN: 25.0000 ug | INTRAMUSCULAR | Status: DC | PRN

## 2019-02-18 MED ORDER — KETOROLAC TROMETHAMINE 15 MG/ML IJ SOLN
7.5000 mg | Freq: Four times a day (QID) | INTRAMUSCULAR | Status: AC
  Administered 2019-02-18 – 2019-02-19 (×3): 7.5 mg via INTRAVENOUS
  Filled 2019-02-18 (×3): qty 1

## 2019-02-18 MED ORDER — CEFAZOLIN SODIUM-DEXTROSE 1-4 GM/50ML-% IV SOLN
1.0000 g | Freq: Four times a day (QID) | INTRAVENOUS | Status: AC
  Administered 2019-02-18 – 2019-02-19 (×2): 1 g via INTRAVENOUS
  Filled 2019-02-18 (×2): qty 50

## 2019-02-18 MED ORDER — ONDANSETRON HCL 4 MG PO TABS: 4.0000 mg | ORAL_TABLET | Freq: Four times a day (QID) | ORAL | Status: DC | PRN

## 2019-02-18 MED ORDER — TRAMADOL HCL 50 MG PO TABS
50.0000 mg | ORAL_TABLET | Freq: Four times a day (QID) | ORAL | Status: DC
  Administered 2019-02-19 – 2019-02-26 (×22): 50 mg via ORAL
  Filled 2019-02-18 (×23): qty 1

## 2019-02-18 MED ORDER — HYDROMORPHONE HCL 1 MG/ML IJ SOLN: 0.5000 mg | INTRAMUSCULAR | Status: DC | PRN

## 2019-02-18 MED ORDER — CEFAZOLIN SODIUM-DEXTROSE 2-3 GM-%(50ML) IV SOLR
INTRAVENOUS | Status: DC | PRN
  Administered 2019-02-18: 2 g via INTRAVENOUS

## 2019-02-18 MED ORDER — ONDANSETRON HCL 4 MG/2ML IJ SOLN: 4.0000 mg | Freq: Once | INTRAMUSCULAR | Status: DC | PRN

## 2019-02-18 MED ORDER — ACETAMINOPHEN 500 MG PO TABS
1000.0000 mg | ORAL_TABLET | Freq: Four times a day (QID) | ORAL | Status: AC
  Administered 2019-02-18 – 2019-02-19 (×4): 1000 mg via ORAL
  Filled 2019-02-18 (×4): qty 2

## 2019-02-18 MED ORDER — SODIUM CHLORIDE 0.9 % IV SOLN
INTRAVENOUS | Status: DC | PRN
  Administered 2019-02-18: 14:00:00 via INTRAVENOUS

## 2019-02-18 MED ORDER — METHOCARBAMOL 500 MG PO TABS
500.0000 mg | ORAL_TABLET | Freq: Four times a day (QID) | ORAL | Status: DC | PRN
  Administered 2019-02-22: 500 mg via ORAL
  Filled 2019-02-18: qty 1

## 2019-02-18 MED ORDER — METHOCARBAMOL 1000 MG/10ML IJ SOLN
500.0000 mg | Freq: Four times a day (QID) | INTRAVENOUS | Status: DC | PRN
  Filled 2019-02-18: qty 5

## 2019-02-18 MED ORDER — ACETAMINOPHEN 10 MG/ML IV SOLN
INTRAVENOUS | Status: AC
  Filled 2019-02-18: qty 100

## 2019-02-18 MED ORDER — PROPOFOL 10 MG/ML IV BOLUS
INTRAVENOUS | Status: DC | PRN
  Administered 2019-02-18: 150 mg via INTRAVENOUS
  Administered 2019-02-18: 10 mg via INTRAVENOUS

## 2019-02-18 MED ORDER — MIDAZOLAM HCL 2 MG/2ML IJ SOLN
INTRAMUSCULAR | Status: AC
  Filled 2019-02-18: qty 2

## 2019-02-18 MED ORDER — EPHEDRINE SULFATE 50 MG/ML IJ SOLN
INTRAMUSCULAR | Status: DC | PRN
  Administered 2019-02-18 (×2): 5 mg via INTRAVENOUS

## 2019-02-18 MED ORDER — ONDANSETRON HCL 4 MG/2ML IJ SOLN
INTRAMUSCULAR | Status: DC | PRN
  Administered 2019-02-18: 4 mg via INTRAVENOUS

## 2019-02-18 MED ORDER — LIDOCAINE HCL (PF) 2 % IJ SOLN
INTRAMUSCULAR | Status: AC
  Filled 2019-02-18: qty 10

## 2019-02-18 MED ORDER — GENTAMICIN SULFATE 40 MG/ML IJ SOLN
INTRAMUSCULAR | Status: AC
  Filled 2019-02-18: qty 2

## 2019-02-18 MED ORDER — MAGNESIUM CITRATE PO SOLN
1.0000 | Freq: Once | ORAL | Status: DC | PRN
  Filled 2019-02-18: qty 296

## 2019-02-18 MED ORDER — ACETAMINOPHEN 10 MG/ML IV SOLN
INTRAVENOUS | Status: DC | PRN
  Administered 2019-02-18: 1000 mg via INTRAVENOUS

## 2019-02-18 MED ORDER — PHENOL 1.4 % MT LIQD
1.0000 | OROMUCOSAL | Status: DC | PRN
  Filled 2019-02-18: qty 177

## 2019-02-18 MED ORDER — LABETALOL HCL 5 MG/ML IV SOLN
10.0000 mg | Freq: Once | INTRAVENOUS | Status: AC
  Administered 2019-02-18: 10 mg via INTRAVENOUS
  Filled 2019-02-18: qty 4

## 2019-02-18 SURGICAL SUPPLY — 40 items
BIT DRILL CANN LG 4.3MM (BIT) ×1 IMPLANT
BNDG COHESIVE 6X5 TAN STRL LF (GAUZE/BANDAGES/DRESSINGS) ×4 IMPLANT
CANISTER SUCT 1200ML W/VALVE (MISCELLANEOUS) ×2 IMPLANT
COVER WAND RF STERILE (DRAPES) ×2 IMPLANT
CRADLE LAMINECT ARM (MISCELLANEOUS) ×2 IMPLANT
DRAPE 3/4 80X56 (DRAPES) ×4 IMPLANT
DRAPE STERI IOBAN 125X83 (DRAPES) ×2 IMPLANT
DRAPE SURG 17X11 SM STRL (DRAPES) ×4 IMPLANT
DRAPE U-SHAPE 47X51 STRL (DRAPES) ×2 IMPLANT
DRILL BIT CANN LG 4.3MM (BIT) ×2
DRSG OPSITE POSTOP 3X4 (GAUZE/BANDAGES/DRESSINGS) ×4 IMPLANT
DRSG OPSITE POSTOP 4X14 (GAUZE/BANDAGES/DRESSINGS) ×2 IMPLANT
DRSG OPSITE POSTOP 4X6 (GAUZE/BANDAGES/DRESSINGS) ×2 IMPLANT
DURAPREP 26ML APPLICATOR (WOUND CARE) ×4 IMPLANT
ELECT REM PT RETURN 9FT ADLT (ELECTROSURGICAL) ×2
ELECTRODE REM PT RTRN 9FT ADLT (ELECTROSURGICAL) ×1 IMPLANT
GLOVE BIOGEL PI IND STRL 9 (GLOVE) ×1 IMPLANT
GLOVE BIOGEL PI INDICATOR 9 (GLOVE) ×1
GLOVE SURG 9.0 ORTHO LTXF (GLOVE) ×4 IMPLANT
GOWN STRL REUS TWL 2XL XL LVL4 (GOWN DISPOSABLE) ×2 IMPLANT
GOWN STRL REUS W/ TWL LRG LVL3 (GOWN DISPOSABLE) ×1 IMPLANT
GOWN STRL REUS W/TWL LRG LVL3 (GOWN DISPOSABLE) ×1
GUIDEPIN VERSANAIL DSP 3.2X444 (ORTHOPEDIC DISPOSABLE SUPPLIES) ×2 IMPLANT
HEMOVAC 400CC 10FR (MISCELLANEOUS) IMPLANT
KIT TURNOVER CYSTO (KITS) ×2 IMPLANT
MAT ABSORB  FLUID 56X50 GRAY (MISCELLANEOUS) ×1
MAT ABSORB FLUID 56X50 GRAY (MISCELLANEOUS) ×1 IMPLANT
NAIL HIP FRACT 130D 11X180 (Screw) ×2 IMPLANT
NS IRRIG 1000ML POUR BTL (IV SOLUTION) ×2 IMPLANT
PACK HIP COMPR (MISCELLANEOUS) ×2 IMPLANT
SCREW BONE CORTICAL 5.0X38 (Screw) ×2 IMPLANT
SCREW LAG 10.5MMX105MM HFN (Screw) ×2 IMPLANT
STAPLER SKIN PROX 35W (STAPLE) ×2 IMPLANT
SUCTION FRAZIER HANDLE 10FR (MISCELLANEOUS) ×1
SUCTION TUBE FRAZIER 10FR DISP (MISCELLANEOUS) ×1 IMPLANT
SUT VIC AB 0 CT1 36 (SUTURE) ×4 IMPLANT
SUT VIC AB 2-0 CT1 27 (SUTURE) ×1
SUT VIC AB 2-0 CT1 TAPERPNT 27 (SUTURE) ×1 IMPLANT
SUT VICRYL 0 AB UR-6 (SUTURE) ×2 IMPLANT
SYR 30ML LL (SYRINGE) ×2 IMPLANT

## 2019-02-18 NOTE — Progress Notes (Signed)
PROGRESS NOTE    [name]  [id number] DOB: 08/20/1950 DOA: 02/16/2019 PCP: Tonia Ghent, MD   Brief Narrative:  68 year old with history of HTN, Perkins disease status post brain stimulator, bradycardia, PTSD, obstructive sleep apnea presented after mechanical fall with right hip fracture.  Plans for ORIF by orthopedic.   Assessment & Plan:   Active Problems:   Essential hypertension   Erectile dysfunction   HLD (hyperlipidemia)   Depression   GERD (gastroesophageal reflux disease)   Closed right hip fracture (HCC)  Right hip fracture, closed status post fall -Plans for ORIF today -PT/OT postoperatively -Pain control, bowel regimen Moderate risk surgery given his risk factors.  No contraindications noted.  History of Parkinson's disease -Continue Sinemet  History of anxiety/depression -Continue Lexapro and Klonopin  GERD -PPI   DVT prophylaxis: Postoperative prophylaxis per surgical team-Lovenox Code Status: Full code Family Communication: Wife at bedside Disposition Plan: Maintain hospital stay until surgical repair  Consultants:   Orthopedic  Procedures:   Plans for ORIF  Antimicrobials:      Subjective: Still having pain of the right hip but better with oral oxycodone and IV Dilaudid. Overall patient remains slightly confused but this is chronic.  Review of Systems Otherwise negative except as per HPI, including: General = no fevers, chills, dizziness, malaise, fatigue HEENT/EYES = negative for pain, redness, loss of vision, double vision, blurred vision, loss of hearing, sore throat, hoarseness, dysphagia Cardiovascular= negative for chest pain, palpitation, murmurs, lower extremity swelling Respiratory/lungs= negative for shortness of breath, cough, hemoptysis, wheezing, mucus production Gastrointestinal= negative for nausea, vomiting,, abdominal pain, melena, hematemesis Genitourinary= negative for Dysuria, Hematuria, Change in  Urinary Frequency MSK = right hip pain especially with movement Neurology= Negative for headache, seizures, numbness, tingling  Psychiatry= Negative for anxiety, depression, suicidal and homocidal ideation Allergy/Immunology= Medication/Food allergy as listed  Skin= Negative for Rash, lesions, ulcers, itching   Objective: Vitals:   02/17/19 1944 02/17/19 1959 02/17/19 2328 02/18/19 0743  BP: (!) 143/70 (!) 160/78 (!) 155/75 (!) 171/76  Pulse: 71 (!) 52 (!) 52 (!) 51  Resp: 19 16 17    Temp:  99.2 F (37.3 C) 99.6 F (37.6 C) 99.4 F (37.4 C)  TempSrc:  Oral  Oral  SpO2: 100% 98% 100% 98%  Weight:  86.8 kg    Height:  5\' 9"  (1.753 m)      Intake/Output Summary (Last 24 hours) at 02/18/2019 1025 Last data filed at 02/18/2019 0500 Gross per 24 hour  Intake 733.36 ml  Output 850 ml  Net -116.64 ml   Filed Weights   02/16/19 1630 02/17/19 1959  Weight: 83.9 kg 86.8 kg    Examination: Constitutional: Overall appears comfortable except in pain with movement Eyes: PERRL, lids and conjunctivae normal ENMT: Mucous membranes are moist. Posterior pharynx clear of any exudate or lesions.Normal dentition.  Neck: normal, supple, no masses, no thyromegaly Respiratory: Clear to auscultation bilaterally Cardiovascular: Normal sinus rhythm Abdomen: Abdomen is nontender nondistended Musculoskeletal: no clubbing / cyanosis. No joint deformity upper and lower extremities. Good ROM, no contractures. Normal muscle tone.  Skin: no rashes, lesions, ulcers. No induration Neurologic: No focal neuro deficits Psychiatric: Alert to name and place.  Confusion but more or less at his baseline.    Data Reviewed:   CBC: Recent Labs  Lab 02/16/19 1640 02/17/19 0655 02/18/19 0333  WBC 8.5 10.9* 8.8  NEUTROABS 6.2  --   --   HGB 14.6 14.1 13.1  HCT 42.8 40.4 39.0  MCV 89.4 88.0 89.4  PLT 182 164 [id number]   Basic Metabolic Panel: Recent Labs  Lab 02/16/19 1640 02/17/19 0655 02/18/19 0333  NA  140 140 139  K 4.0 3.4* 3.9  CL 105 107 105  CO2 26 26 26   GLUCOSE 158* 124* 134*  BUN 25* 22 21  CREATININE 0.92 0.98 0.87  CALCIUM 8.8* 8.6* 8.4*  MG  --   --  2.1   GFR: Estimated Creatinine Clearance: 87.4 mL/min (by C-G formula based on SCr of 0.87 mg/dL). Liver Function Tests: No results for input(s): AST, ALT, ALKPHOS, BILITOT, PROT, ALBUMIN in the last 168 hours. No results for input(s): LIPASE, AMYLASE in the last 168 hours. No results for input(s): AMMONIA in the last 168 hours. Coagulation Profile: Recent Labs  Lab 02/16/19 1640  INR 1.1   Cardiac Enzymes: No results for input(s): CKTOTAL, CKMB, CKMBINDEX, TROPONINI in the last 168 hours. BNP (last 3 results) No results for input(s): PROBNP in the last 8760 hours. HbA1C: No results for input(s): HGBA1C in the last 72 hours. CBG: No results for input(s): GLUCAP in the last 168 hours. Lipid Profile: No results for input(s): CHOL, HDL, LDLCALC, TRIG, CHOLHDL, LDLDIRECT in the last 72 hours. Thyroid Function Tests: No results for input(s): TSH, T4TOTAL, FREET4, T3FREE, THYROIDAB in the last 72 hours. Anemia Panel: No results for input(s): VITAMINB12, FOLATE, FERRITIN, TIBC, IRON, RETICCTPCT in the last 72 hours. Sepsis Labs: No results for input(s): PROCALCITON, LATICACIDVEN in the last 168 hours.  Recent Results (from the past 240 hour(s))  SARS Coronavirus 2 by RT PCR (hospital order, performed in [facility] hospital lab) Nasopharyngeal Nasopharyngeal Swab     Status: None   Collection Time: 02/16/19  4:40 PM   Specimen: Nasopharyngeal Swab  Result Value Ref Range Status   SARS Coronavirus 2 NEGATIVE NEGATIVE Final    Comment: (NOTE) If result is NEGATIVE SARS-CoV-2 target nucleic acids are NOT DETECTED. The SARS-CoV-2 RNA is generally detectable in upper and lower  respiratory specimens during the acute phase of infection. The lowest  concentration of SARS-CoV-2 viral copies this assay can detect is 250   copies / mL. A negative result does not preclude SARS-CoV-2 infection  and should not be used as the sole basis for treatment or other  patient management decisions.  A negative result may occur with  improper specimen collection / handling, submission of specimen other  than nasopharyngeal swab, presence of viral mutation(s) within the  areas targeted by this assay, and inadequate number of viral copies  (<250 copies / mL). A negative result must be combined with clinical  observations, patient history, and epidemiological information. If result is POSITIVE SARS-CoV-2 target nucleic acids are DETECTED. The SARS-CoV-2 RNA is generally detectable in upper and lower  respiratory specimens dur ing the acute phase of infection.  Positive  results are indicative of active infection with SARS-CoV-2.  Clinical  correlation with patient history and other diagnostic information is  necessary to determine patient infection status.  Positive results do  not rule out bacterial infection or co-infection with other viruses. If result is PRESUMPTIVE POSTIVE SARS-CoV-2 nucleic acids MAY BE PRESENT.   A presumptive positive result was obtained on the submitted specimen  and confirmed on repeat testing.  While 2019 novel coronavirus  (SARS-CoV-2) nucleic acids may be present in the submitted sample  additional confirmatory testing may be necessary for epidemiological  and / or clinical management purposes  to differentiate between  SARS-CoV-2 and other  Sarbecovirus currently known to infect humans.  If clinically indicated additional testing with an alternate test  methodology [phone]) is advised. The SARS-CoV-2 RNA is generally  detectable in upper and lower respiratory sp ecimens during the acute  phase of infection. The expected result is Negative. Fact Sheet for Patients:  StrictlyIdeas.no Fact Sheet for Healthcare Providers: BankingDealers.co.za  This test is not yet approved or cleared by the [place] FDA and has been authorized for detection and/or diagnosis of SARS-CoV-2 by FDA under an Emergency Use Authorization (EUA).  This EUA will remain in effect (meaning this test can be used) for the duration of the COVID-19 declaration under Section 564(b)(1) of the Act, 21 U.S.C. section 360bbb-3(b)(1), unless the authorization is terminated or revoked sooner. Performed at [facility], [name] [place], [place] [address]   Surgical pcr screen     Status: None   Collection Time: 02/17/19  8:27 PM   Specimen: Nasal Mucosa; Nasal Swab  Result Value Ref Range Status   MRSA, PCR NEGATIVE NEGATIVE Final   Staphylococcus aureus NEGATIVE NEGATIVE Final    Comment: (NOTE) The Xpert SA Assay (FDA approved for NASAL specimens in patients 70 years of age and older), is one component of a comprehensive surveillance program. It is not intended to diagnose infection nor to guide or monitor treatment. Performed at [facility], [address], [place], [place] [address]          Radiology Studies: Dg Chest 1 View  Result Date: 02/16/2019 CLINICAL DATA:  Hip fracture EXAM: CHEST  1 VIEW COMPARISON:  September 25, 2018 FINDINGS: The heart size and mediastinal contours are within normal limits. Both lungs are clear. The visualized skeletal structures are unremarkable. IMPRESSION: No active disease. Electronically Signed   By: Dorise Bullion III M.D   On: 02/16/2019 17:03   Ct Head Wo Contrast  Result Date: 02/16/2019 CLINICAL DATA:  Lost balance and fell approximately 3 p.m. today. EXAM: CT HEAD WITHOUT CONTRAST CT CERVICAL SPINE WITHOUT CONTRAST TECHNIQUE: Multidetector CT imaging of the head and cervical spine was performed following the standard protocol without intravenous contrast. Multiplanar CT image reconstructions of the cervical spine were also generated. COMPARISON:  Head CT 10/11/2017 FINDINGS: CT HEAD  FINDINGS Brain: Bilateral thalamic stimulators are noted. Stable ventriculomegaly and cerebral atrophy. No CT findings for acute intracranial process such as hemorrhage or infarction. No extra-axial fluid collections are identified. No mass lesions. Vascular: Advanced vascular calcifications for age but no hyperdense vessels or aneurysm. Skull: No skull fracture or bone lesions. Sinuses/Orbits: The paranasal sinuses and mastoid air cells are clear. The globes are intact. Other: No scalp lesions or hematoma. CT CERVICAL SPINE FINDINGS Alignment: Normal Skull base and vertebrae: No acute fracture. No primary bone lesion or focal pathologic process. Soft tissues and spinal canal: No prevertebral fluid or swelling. No visible canal hematoma. Disc levels: The spinal canal is fairly generous. No significant spinal stenosis. Mild uncinate spurring changes and facet disease but no significant foraminal stenosis. Upper chest: The visualized lung apices are grossly clear. Aortic calcifications are noted. Other: Age advanced carotid artery calcifications are noted bilaterally. There also moderate vertebral artery calcifications. IMPRESSION: 1. No acute intracranial findings or skull fracture. 2. Stable position and appearance of the bilateral thalamic stimulators. 3. Normal alignment of the cervical vertebral bodies and no acute cervical spine fracture. 4. Age advanced vascular calcifications. Electronically Signed   By: Marijo Sanes M.D.   On: 02/16/2019 17:16   Ct  Cervical Spine Wo Contrast  Result Date: 02/16/2019 CLINICAL DATA:  Lost balance and fell approximately 3 p.m. today. EXAM: CT HEAD WITHOUT CONTRAST CT CERVICAL SPINE WITHOUT CONTRAST TECHNIQUE: Multidetector CT imaging of the head and cervical spine was performed following the standard protocol without intravenous contrast. Multiplanar CT image reconstructions of the cervical spine were also generated. COMPARISON:  Head CT 10/11/2017 FINDINGS: CT HEAD  FINDINGS Brain: Bilateral thalamic stimulators are noted. Stable ventriculomegaly and cerebral atrophy. No CT findings for acute intracranial process such as hemorrhage or infarction. No extra-axial fluid collections are identified. No mass lesions. Vascular: Advanced vascular calcifications for age but no hyperdense vessels or aneurysm. Skull: No skull fracture or bone lesions. Sinuses/Orbits: The paranasal sinuses and mastoid air cells are clear. The globes are intact. Other: No scalp lesions or hematoma. CT CERVICAL SPINE FINDINGS Alignment: Normal Skull base and vertebrae: No acute fracture. No primary bone lesion or focal pathologic process. Soft tissues and spinal canal: No prevertebral fluid or swelling. No visible canal hematoma. Disc levels: The spinal canal is fairly generous. No significant spinal stenosis. Mild uncinate spurring changes and facet disease but no significant foraminal stenosis. Upper chest: The visualized lung apices are grossly clear. Aortic calcifications are noted. Other: Age advanced carotid artery calcifications are noted bilaterally. There also moderate vertebral artery calcifications. IMPRESSION: 1. No acute intracranial findings or skull fracture. 2. Stable position and appearance of the bilateral thalamic stimulators. 3. Normal alignment of the cervical vertebral bodies and no acute cervical spine fracture. 4. Age advanced vascular calcifications. Electronically Signed   By: Marijo Sanes M.D.   On: 02/16/2019 17:16   Dg Hip Unilat W Or Wo Pelvis 2-3 Views Right  Result Date: 02/16/2019 CLINICAL DATA:  Pain after fall EXAM: DG HIP (WITH OR WITHOUT PELVIS) 2-3V RIGHT COMPARISON:  None. FINDINGS: There is an intertrochanteric fracture on the right without significant displacement. No dislocation. Surgical hardware seen in the lower lumbar spine. Vascular calcifications are seen in the femoral vessels. No other fractures are noted. IMPRESSION: Fracture through the right  intertrochanteric region without significant displacement. Electronically Signed   By: Dorise Bullion III M.D   On: 02/16/2019 17:02        Scheduled Meds: . carbidopa-levodopa  1 tablet Oral BID AC  . carbidopa-levodopa  2 tablet Oral Q breakfast  . Chlorhexidine Gluconate Cloth  6 each Topical Daily  . cholecalciferol  1,000 Units Oral Daily  . escitalopram  10 mg Oral Daily  . Melatonin  10 mg Oral QHS  . memantine  10 mg Oral BID  . pantoprazole  40 mg Oral Daily   Continuous Infusions:    LOS: 2 days   Time spent= 25 mins    Kassie Keng Arsenio Loader, MD [facility]  If 7PM-7AM, please contact night-coverage  02/18/2019, 10:25 AM

## 2019-02-18 NOTE — Progress Notes (Signed)
Patient xferred to OR for surgical procedure , in stable condition , wife with patient.

## 2019-02-18 NOTE — Progress Notes (Signed)
Given Labetalol 10mg  via IV , report given to oncoming nurse.

## 2019-02-18 NOTE — Anesthesia Preprocedure Evaluation (Signed)
Anesthesia Evaluation  Patient identified by MRN, date of birth, ID band Patient awake    Reviewed: Allergy & Precautions, H&P , NPO status , Patient's Chart, lab work & pertinent test results, reviewed documented beta blocker date and time   History of Anesthesia Complications Negative for: history of anesthetic complications  Airway Mallampati: II  TM Distance: >3 FB Neck ROM: full    Dental no notable dental hx. (+) Dental Advidsory Given, Caps, Teeth Intact, Chipped   Pulmonary neg shortness of breath, sleep apnea , neg COPD, neg recent URI,    Pulmonary exam normal        Cardiovascular Exercise Tolerance: Good hypertension, (-) angina(-) Past MI and (-) Cardiac Stents Normal cardiovascular exam+ dysrhythmias (-) Valvular Problems/Murmurs     Neuro/Psych neg Seizures PSYCHIATRIC DISORDERS Anxiety Depression Parkinson's dz    GI/Hepatic Neg liver ROS, GERD  ,  Endo/Other  negative endocrine ROS  Renal/GU Renal disease (kidney stones)  negative genitourinary   Musculoskeletal   Abdominal   Peds  Hematology negative hematology ROS (+)   Anesthesia Other Findings Past Medical History: No date: Arthritis No date: Bradycardia 2013: Cancer ([place])     Comment:  skin cancer No date: Depression     Comment:  ptsd No date: Dysrhythmia     Comment:  chronic slow heart rate No date: GERD (gastroesophageal reflux disease) No date: Headache(784.0)     Comment:  tension headaches non recent No date: History of chicken pox No date: History of kidney stones     Comment:  passed No date: Hypertension     Comment:  treated with HCTZ No date: Parkinson's disease ([place])     Comment:  dx'ed 15 years ago No date: PTSD (post-traumatic stress disorder) No date: Shortness of breath dyspnea No date: Sleep apnea     Comment:  doesn't use C-pap No date: Varicose veins   Reproductive/Obstetrics negative OB ROS                              Anesthesia Physical Anesthesia Plan  ASA: III  Anesthesia Plan: Spinal   Post-op Pain Management:    Induction: Intravenous  PONV Risk Score and Plan: 2 and Propofol infusion and TIVA  Airway Management Planned: Natural Airway and Nasal Cannula  Additional Equipment:   Intra-op Plan:   Post-operative Plan:   Informed Consent: I have reviewed the patients History and Physical, chart, labs and discussed the procedure including the risks, benefits and alternatives for the proposed anesthesia with the patient or authorized representative who has indicated his/her understanding and acceptance.     Dental Advisory Given  Plan Discussed with: Anesthesiologist, CRNA and Surgeon  Anesthesia Plan Comments:         Anesthesia Quick Evaluation

## 2019-02-18 NOTE — Progress Notes (Signed)
Subjective:  Patient seen in the preoperative area.  He has been cleared for surgery by the hospitalist service.  His wife is at the bedside.  Patient complains of right hip pain.  Objective:   VITALS:   Vitals:   02/17/19 1959 02/17/19 2328 02/18/19 0743 02/18/19 1344  BP: (!) 160/78 (!) 155/75 (!) 171/76 (!) 170/76  Pulse: (!) 52 (!) 52 (!) 51 (!) 51  Resp: 16 17  16   Temp: 99.2 F (37.3 C) 99.6 F (37.6 C) 99.4 F (37.4 C) 98.3 F (36.8 C)  TempSrc: Oral  Oral Tympanic  SpO2: 98% 100% 98% 98%  Weight: 86.8 kg   86 kg  Height: 5\' 9"  (1.753 m)   5\' 9"  (1.753 m)    PHYSICAL EXAM: Right lower extremity: Skin remains intact without erythema ecchymosis or significant swelling. Neurovascular intact Sensation intact distally Intact pulses distally Dorsiflexion/Plantar flexion intact No cellulitis present Compartment soft  LABS  Results for orders placed or performed during the hospital encounter of 02/16/19 (from the past 24 hour(s))  Type and screen If not already done in ED     Status: None   Collection Time: 02/17/19  5:04 PM  Result Value Ref Range   ABO/RH(D) A NEG    Antibody Screen NEG    Sample Expiration      02/20/2019,2359 Performed at [facility], [address], [place], [place] [address]   Surgical pcr screen     Status: None   Collection Time: 02/17/19  8:27 PM   Specimen: Nasal Mucosa; Nasal Swab  Result Value Ref Range   MRSA, PCR NEGATIVE NEGATIVE   Staphylococcus aureus NEGATIVE NEGATIVE  CBC     Status: None   Collection Time: 02/18/19  3:33 AM  Result Value Ref Range   WBC 8.8 4.0 - 10.5 K/uL   RBC 4.36 4.22 - 5.81 MIL/uL   Hemoglobin 13.1 13.0 - 17.0 g/dL   HCT 39.0 39.0 - 52.0 %   MCV 89.4 80.0 - 100.0 fL   MCH 30.0 26.0 - 34.0 pg   MCHC 33.6 30.0 - 36.0 g/dL   RDW 12.8 11.5 - 15.5 %   Platelets 150 150 - 400 K/uL   nRBC 0.0 0.0 - 0.2 %  Basic metabolic panel     Status: Abnormal   Collection Time: 02/18/19  3:33 AM   Result Value Ref Range   Sodium 139 135 - 145 mmol/L   Potassium 3.9 3.5 - 5.1 mmol/L   Chloride 105 98 - 111 mmol/L   CO2 26 22 - 32 mmol/L   Glucose, Bld 134 (H) 70 - 99 mg/dL   BUN 21 8 - 23 mg/dL   Creatinine, Ser 0.87 0.61 - 1.24 mg/dL   Calcium 8.4 (L) 8.9 - 10.3 mg/dL   GFR calc non Af Amer >60 >60 mL/min   GFR calc Af Amer >60 >60 mL/min   Anion gap 8 5 - 15  Magnesium     Status: None   Collection Time: 02/18/19  3:33 AM  Result Value Ref Range   Magnesium 2.1 1.7 - 2.4 mg/dL    Dg Chest 1 View  Result Date: 02/16/2019 CLINICAL DATA:  Hip fracture EXAM: CHEST  1 VIEW COMPARISON:  September 25, 2018 FINDINGS: The heart size and mediastinal contours are within normal limits. Both lungs are clear. The visualized skeletal structures are unremarkable. IMPRESSION: No active disease. Electronically Signed   By: Dorise Bullion III M.D   On: 02/16/2019  17:03   Ct Head Wo Contrast  Result Date: 02/16/2019 CLINICAL DATA:  Lost balance and fell approximately 3 p.m. today. EXAM: CT HEAD WITHOUT CONTRAST CT CERVICAL SPINE WITHOUT CONTRAST TECHNIQUE: Multidetector CT imaging of the head and cervical spine was performed following the standard protocol without intravenous contrast. Multiplanar CT image reconstructions of the cervical spine were also generated. COMPARISON:  Head CT 10/11/2017 FINDINGS: CT HEAD FINDINGS Brain: Bilateral thalamic stimulators are noted. Stable ventriculomegaly and cerebral atrophy. No CT findings for acute intracranial process such as hemorrhage or infarction. No extra-axial fluid collections are identified. No mass lesions. Vascular: Advanced vascular calcifications for age but no hyperdense vessels or aneurysm. Skull: No skull fracture or bone lesions. Sinuses/Orbits: The paranasal sinuses and mastoid air cells are clear. The globes are intact. Other: No scalp lesions or hematoma. CT CERVICAL SPINE FINDINGS Alignment: Normal Skull base and vertebrae: No acute fracture.  No primary bone lesion or focal pathologic process. Soft tissues and spinal canal: No prevertebral fluid or swelling. No visible canal hematoma. Disc levels: The spinal canal is fairly generous. No significant spinal stenosis. Mild uncinate spurring changes and facet disease but no significant foraminal stenosis. Upper chest: The visualized lung apices are grossly clear. Aortic calcifications are noted. Other: Age advanced carotid artery calcifications are noted bilaterally. There also moderate vertebral artery calcifications. IMPRESSION: 1. No acute intracranial findings or skull fracture. 2. Stable position and appearance of the bilateral thalamic stimulators. 3. Normal alignment of the cervical vertebral bodies and no acute cervical spine fracture. 4. Age advanced vascular calcifications. Electronically Signed   By: Marijo Sanes M.D.   On: 02/16/2019 17:16   Ct Cervical Spine Wo Contrast  Result Date: 02/16/2019 CLINICAL DATA:  Lost balance and fell approximately 3 p.m. today. EXAM: CT HEAD WITHOUT CONTRAST CT CERVICAL SPINE WITHOUT CONTRAST TECHNIQUE: Multidetector CT imaging of the head and cervical spine was performed following the standard protocol without intravenous contrast. Multiplanar CT image reconstructions of the cervical spine were also generated. COMPARISON:  Head CT 10/11/2017 FINDINGS: CT HEAD FINDINGS Brain: Bilateral thalamic stimulators are noted. Stable ventriculomegaly and cerebral atrophy. No CT findings for acute intracranial process such as hemorrhage or infarction. No extra-axial fluid collections are identified. No mass lesions. Vascular: Advanced vascular calcifications for age but no hyperdense vessels or aneurysm. Skull: No skull fracture or bone lesions. Sinuses/Orbits: The paranasal sinuses and mastoid air cells are clear. The globes are intact. Other: No scalp lesions or hematoma. CT CERVICAL SPINE FINDINGS Alignment: Normal Skull base and vertebrae: No acute fracture. No  primary bone lesion or focal pathologic process. Soft tissues and spinal canal: No prevertebral fluid or swelling. No visible canal hematoma. Disc levels: The spinal canal is fairly generous. No significant spinal stenosis. Mild uncinate spurring changes and facet disease but no significant foraminal stenosis. Upper chest: The visualized lung apices are grossly clear. Aortic calcifications are noted. Other: Age advanced carotid artery calcifications are noted bilaterally. There also moderate vertebral artery calcifications. IMPRESSION: 1. No acute intracranial findings or skull fracture. 2. Stable position and appearance of the bilateral thalamic stimulators. 3. Normal alignment of the cervical vertebral bodies and no acute cervical spine fracture. 4. Age advanced vascular calcifications. Electronically Signed   By: Marijo Sanes M.D.   On: 02/16/2019 17:16   Dg Hip Unilat W Or Wo Pelvis 2-3 Views Right  Result Date: 02/16/2019 CLINICAL DATA:  Pain after fall EXAM: DG HIP (WITH OR WITHOUT PELVIS) 2-3V RIGHT COMPARISON:  None. FINDINGS: There  is an intertrochanteric fracture on the right without significant displacement. No dislocation. Surgical hardware seen in the lower lumbar spine. Vascular calcifications are seen in the femoral vessels. No other fractures are noted. IMPRESSION: Fracture through the right intertrochanteric region without significant displacement. Electronically Signed   By: Dorise Bullion III M.D   On: 02/16/2019 17:02    Assessment/Plan: Day of Surgery   Active Problems:   Essential hypertension   Erectile dysfunction   HLD (hyperlipidemia)   Depression   GERD (gastroesophageal reflux disease)   Closed right hip fracture [facility])  Patient surgery was delayed from yesterday due to lack of available hospital beds postoperatively.  Patient is now being brought to the operating room for definitive fixation of his right hip fracture.  The risk benefits of surgery have been reviewed  with the patient and his wife.  They are in agreement with the plan for surgery.    Thornton Park , MD 02/18/2019, 2:15 PM

## 2019-02-18 NOTE — Transfer of Care (Signed)
Immediate Anesthesia Transfer of Care Note  Patient: [name]  Procedure(s) Performed: INTRAMEDULLARY (IM) NAIL INTERTROCHANTRIC, RIGHT, (N/A )  Patient Location: PACU  Anesthesia Type:General  Level of Consciousness: sedated  Airway & Oxygen Therapy: Patient Spontanous Breathing and Patient connected to nasal cannula oxygen  Post-op Assessment: Report given to RN and Post -op Vital signs reviewed and stable  Post vital signs: Reviewed and stable  Last Vitals:  Vitals Value Taken Time  BP 144/68 02/18/19 1705  Temp 36.5 C 02/18/19 1705  Pulse 52 02/18/19 1708  Resp 19 02/18/19 1708  SpO2 100 % 02/18/19 1708  Vitals shown include unvalidated device data.  Last Pain:  Vitals:   02/18/19 1344  TempSrc: Tympanic  PainSc: 0-No pain         Complications: No apparent anesthesia complications

## 2019-02-18 NOTE — Anesthesia Post-op Follow-up Note (Signed)
Anesthesia QCDR form completed.        

## 2019-02-18 NOTE — Anesthesia Postprocedure Evaluation (Signed)
Anesthesia Post Note  Patient: [name]  Procedure(s) Performed: INTRAMEDULLARY (IM) NAIL INTERTROCHANTRIC, RIGHT, (N/A )  Patient location during evaluation: PACU Anesthesia Type: General Level of consciousness: awake and alert Pain management: pain level controlled Vital Signs Assessment: post-procedure vital signs reviewed and stable Respiratory status: spontaneous breathing, nonlabored ventilation, respiratory function stable and patient connected to nasal cannula oxygen Cardiovascular status: blood pressure returned to baseline and stable Postop Assessment: no apparent nausea or vomiting Anesthetic complications: no     Last Vitals:  Vitals:   02/18/19 1952 02/18/19 2100  BP: (!) 146/67 (!) 146/73  Pulse: 62 60  Resp:  18  Temp:  36.9 C  SpO2:  96%    Last Pain:  Vitals:   02/18/19 2100  TempSrc: Oral  PainSc:                  Cresta Riden S

## 2019-02-18 NOTE — Progress Notes (Signed)
Pt alert with some periods of confusion. No complaints of pain during the night. Pt was able to sleep in between care. Foley patent and draining urine. Possible surgery sometime later today.

## 2019-02-18 NOTE — Progress Notes (Signed)
Patient returned to 1A no co pain or discomfort,. MEWS 2 BP elevated and HR 116. Notified Dr. Reesa Chew

## 2019-02-18 NOTE — Progress Notes (Signed)
Surgical consent is signed and in patients chart.

## 2019-02-18 NOTE — Op Note (Signed)
DATE OF SURGERY:  02/18/2019  TIME: 5:11 PM  PATIENT NAME:  [name]  AGE: 68 y.o.  PRE-OPERATIVE DIAGNOSIS:  Right intertrochanteric hip fracture  POST-OPERATIVE DIAGNOSIS:  SAME  PROCEDURE:  INTRAMEDULLARY (IM) FIXATION OF RIGHT INTERTROCHANTERIC HIP FRACTURE  SURGEON:  Thornton Park  OPERATIVE IMPLANTS: Biomet short Affixus nail 11 x 180, 105 mm lag screw with a 38 mm distal interlocking screw  PREOPERATIVE INDICATIONS:  [name] is a 68 y.o. year old who fell and suffered a hip fracture. He was brought into the ER and then admitted and medically cleared for surgical intervention.    The risks, benefits and alternatives were discussed with the patient and their family.  The risks include but are not limited to infection, bleeding, nerve or blood vessel injury, malunion, nonunion, hardware prominence, hardware failure, change in leg lengths or lower extremity rotation need for further surgery including hardware removal with conversion to a total hip arthroplasty. Medical risks include but are not limited to DVT and pulmonary embolism, myocardial infarction, stroke, pneumonia, respiratory failure and death. The patient and their family understood these risks and wished to proceed with surgery.  OPERATIVE PROCEDURE:  The patient was brought to the operating room and placed in the supine position on the fracture table. General anesthesia with LMA was administered after attempted spinal anesthetic placement was unsuccessful.  A time out was performed to verify the patient's name, date of birth, medical record number, correct site of surgery correct procedure to be performed. The timeout was also used to verify the patient received antibiotics and all appropriate instruments, implants and radiographic studies were available in the room. Once all in attendance were in agreement, the case began. A closed reduction was performed under C-arm guidance.  The fracture reduction was  confirmed on both AP and lateral views.  The patient was prepped and draped in a sterile fashion. He received preoperative antibiotics.  An incision was made proximal to the greater trochanter in line with the femur. A guidewire was placed over the tip of the greater trochanter and advanced into the proximal femur to the level of the lesser trochanter.  Confirmation of the drill pin position was made on AP and lateral C-arm images.  The threaded guidepin was then overdrilled with the proximal femoral drill.  The nail was then inserted into the proximal femur, across the fracture site and into the femoral shaft. Its position was confirmed on AP and lateral C-arm images.   Once the nail was completely seated, the drill guide for the lag screw was placed through the guide arm for the Affixus nail. A guidepin was then placed through this drill guide and advanced through the lateral cortex of the femur, across the fracture site and into the femoral head achieving a tip apex distance of less than 25 mm. The depth of the drill pin was measured, and then the drill for the lag screw was advanced over the guidepin and through the lateral cortex, across the fracture site and up into the femoral head to the depth previously measured to be 105 mm.  The lag screw was then advanced by hand into position across the fracture site into the femoral head. Its final position was confirmed on AP and lateral C-arm images. Compression was applied as traction was carefully released. The set screw in the top of the intramedullary rod was tightened by hand using a screwdriver. It was backed off a quarter turn to allow for compression at the  fracture site.  The drill sleeve for the distal interlocking screw was then placed through the Affixus guide arm. A small stab incision was made to allow the drill guide to approximate the lateral cortex of the femur. The drill for the distal interlocking screw was then advanced bicortically. The  depth of this drill was measured. A distal interlocking screw with the length of 38 was then inserted by hand through the guide arm. Final C-arm images of the entire intramedullary construct were taken in both the AP and lateral planes.   The wounds were irrigated copiously and closed with 0 Vicryl for closure of the deep fascia and 2-0 Vicryl for subcutaneous closure. The skin was approximated with staples. 0.25 % marcaine plain was injected into the incision sites for post-op pain control.  A dry sterile dressing was applied. I was scrubbed and present the entire case and all sharp and instrument counts were correct at the conclusion of the case. Patient was transferred to hospital bed and brought to PACU in stable condition. I spoke with the patient's wife in the postop consultation room to let her know the case had been performed without complication and the patient was stable in the recovery room.     Timoteo Gaul, MD

## 2019-02-19 ENCOUNTER — Other Ambulatory Visit: Payer: Self-pay

## 2019-02-19 ENCOUNTER — Encounter: Payer: Self-pay | Admitting: Orthopedic Surgery

## 2019-02-19 DIAGNOSIS — N529 Male erectile dysfunction, unspecified: Secondary | ICD-10-CM

## 2019-02-19 LAB — CBC
HCT: 34.5 % — ABNORMAL LOW (ref 39.0–52.0)
Hemoglobin: 11.8 g/dL — ABNORMAL LOW (ref 13.0–17.0)
MCH: 30.1 pg (ref 26.0–34.0)
MCHC: 34.2 g/dL (ref 30.0–36.0)
MCV: 88 fL (ref 80.0–100.0)
Platelets: 141 10*3/uL — ABNORMAL LOW (ref 150–400)
RBC: 3.92 MIL/uL — ABNORMAL LOW (ref 4.22–5.81)
RDW: 12.9 % (ref 11.5–15.5)
WBC: 9.4 10*3/uL (ref 4.0–10.5)
nRBC: 0 % (ref 0.0–0.2)

## 2019-02-19 LAB — BASIC METABOLIC PANEL
Anion gap: 8 (ref 5–15)
BUN: 19 mg/dL (ref 8–23)
CO2: 24 mmol/L (ref 22–32)
Calcium: 8.1 mg/dL — ABNORMAL LOW (ref 8.9–10.3)
Chloride: 104 mmol/L (ref 98–111)
Creatinine, Ser: 0.88 mg/dL (ref 0.61–1.24)
GFR calc Af Amer: >60 mL/min (ref >60)
GFR calc non Af Amer: >60 mL/min (ref >60)
Glucose, Bld: 153 mg/dL — ABNORMAL HIGH (ref 70–99)
Potassium: 3.3 mmol/L — ABNORMAL LOW (ref 3.5–5.1)
Sodium: 136 mmol/L (ref 135–145)

## 2019-02-19 LAB — MAGNESIUM: Magnesium: 1.7 mg/dL (ref 1.7–2.4)

## 2019-02-19 MED ORDER — CHLORHEXIDINE GLUCONATE CLOTH 2 % EX PADS: 6.0000 | MEDICATED_PAD | Freq: Every day | CUTANEOUS | Status: DC

## 2019-02-19 MED ORDER — MAGNESIUM SULFATE 2 GM/50ML IV SOLN
2.0000 g | Freq: Once | INTRAVENOUS | Status: AC
  Administered 2019-02-19: 2 g via INTRAVENOUS
  Filled 2019-02-19: qty 50

## 2019-02-19 MED ORDER — POTASSIUM CHLORIDE 20 MEQ PO PACK
40.0000 meq | PACK | Freq: Once | ORAL | Status: AC
  Administered 2019-02-19: 12:00:00 40 meq via ORAL
  Filled 2019-02-19: qty 2

## 2019-02-19 MED ORDER — POTASSIUM CHLORIDE CRYS ER 20 MEQ PO TBCR: 40.0000 meq | EXTENDED_RELEASE_TABLET | Freq: Once | ORAL | Status: DC

## 2019-02-19 MED ORDER — SODIUM CHLORIDE 0.9 % IV SOLN
INTRAVENOUS | Status: AC
  Administered 2019-02-19: 08:00:00 via INTRAVENOUS

## 2019-02-19 NOTE — TOC Initial Note (Signed)
Transition of Care [facility]) - Initial/Assessment Note    Patient Details  Name: [name] MRN: [id number] Date of Birth: 1950/07/13  Transition of Care [facility]) CM/SW Contact:    Su Hilt, RN Phone Number: 02/19/2019, 11:30 AM  Clinical Narrative:                 Met with the patient and his spouse at bedside. We discussed the recommendation to go to rehab.  I explained that there is a 14 day quarantine and that there is no visitors.  The patient understands that his wife is not able to take care of him at home in the current condition.  He agrees and his wife agrees to do a bed search and PASSR as well as FL2..  Will review bed offers once obtained  Expected Discharge Plan: [place] Barriers to Discharge: Insurance Authorization, Continued Medical Work up, SNF Pending bed offer   Patient Goals and CMS Choice Patient states their goals for this hospitalization and ongoing recovery are:: go home      Expected Discharge Plan and Services Expected Discharge Plan: [place]   Discharge Planning Services: CM Consult   Living arrangements for the past 2 months: Single Family Home                                      Prior Living Arrangements/Services Living arrangements for the past 2 months: Single Family Home Lives with:: Spouse Patient language and need for interpreter reviewed:: Yes Do you feel safe going back to the place where you live?: Yes      Need for Family Participation in Patient Care: No (Comment) Care giver support system in place?: Yes (comment)   Criminal Activity/Legal Involvement Pertinent to Current Situation/Hospitalization: No - Comment as needed  Activities of Daily Living Home Assistive Devices/Equipment: Walker (specify type) ADL Screening (condition at time of admission) Patient's cognitive ability adequate to safely complete daily activities?: No Is the patient deaf or have difficulty hearing?:  No Does the patient have difficulty seeing, even when wearing glasses/contacts?: No Does the patient have difficulty concentrating, remembering, or making decisions?: Yes Patient able to express need for assistance with ADLs?: No Does the patient have difficulty dressing or bathing?: Yes Independently performs ADLs?: No Does the patient have difficulty walking or climbing stairs?: Yes Weakness of Legs: Right Weakness of Arms/Hands: None  Permission Sought/Granted   Permission granted to share information with : Yes, Verbal Permission Granted              Emotional Assessment Appearance:: Appears stated age Attitude/Demeanor/Rapport: Engaged Affect (typically observed): Appropriate Orientation: : Oriented to Self, Oriented to Place, Oriented to  Time, Oriented to Situation Alcohol / Substance Use: Not Applicable Psych Involvement: No (comment)  Admission diagnosis:  Closed fracture of right hip, initial encounter [facility]) [S72.001A] Patient Active Problem List   Diagnosis Date Noted  . Closed right hip fracture ([place]) 02/16/2019  . Rib pain on right side 09/25/2018  . Skin sore 12/01/2017  . Hematoma 10/18/2017  . Fall at home 10/18/2017  . 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 Radicular pain in right arm 10/09/2016  . GERD (gastroesophageal reflux disease) 07/31/2016  . Trochanteric bursitis 03/03/2016  . Left hand pain 10/30/2015  . Fracture, finger, multiple sites 10/30/2015  . Colon cancer screening 07/23/2015  . Healthcare maintenance 07/23/2015  . Gout 02/18/2015  .  Depression 01/06/2015  . Irritation of eyelid 08/20/2014  . Dupuytren's contracture 08/20/2014  . Cough 07/21/2014  . Lumbar stenosis with neurogenic claudication 06/13/2014  . Preop cardiovascular exam 05/28/2014  . S/P deep brain stimulator placement 05/08/2014  . Joint pain 01/22/2014  . Medicare annual wellness visit, initial 01/19/2014  . Advance care planning 01/19/2014  . Parkinson's disease  ([place]) 12/13/2013  . PTSD (post-traumatic stress disorder) 06/13/2013  . Erectile dysfunction 06/13/2013  . HLD (hyperlipidemia) 06/13/2013  . Hip pain 06/10/2013  . Pain in joint, shoulder region 06/10/2013  . Right leg swelling 06/03/2013  . Essential hypertension 06/03/2013  . Bradycardia by electrocardiogram 06/03/2013  . Obstructive sleep apnea 03/12/2013  . Spinal stenosis, lumbar region, with neurogenic claudication 12/14/2012   PCP:  Tonia Ghent, MD Pharmacy:   [facility] [address] [place] [address] Phone: [phone] Fax: [place], [place] [address] [place] [address] Phone: [phone] Fax: [place], [place] [address] Phone: [phone] Fax: [phone]  [facility] [place], [place] [address] [place] [address] Phone: [phone] Fax: [phone]  [facility] [phone] [place], [place] - [place] [address] Phone: [phone] Fax: [phone]     Social Determinants of Health (SDOH) Interventions    Readmission Risk Interventions No flowsheet data found.

## 2019-02-19 NOTE — Progress Notes (Signed)
Pt pulled out IV and pulled off condom cath. Pt confused at times. Notified Ralene Bathe

## 2019-02-19 NOTE — Evaluation (Signed)
Occupational Therapy Evaluation Patient Details Name: [name] MRN: [id number] DOB: 1950-10-21 Today's Date: 02/19/2019    History of Present Illness Pt is a 68 y.o. male presenting to hospital 02/16/19 s/p fall onto R hip when walking without walker and possibly hit head.  Pt s/p IM fixation 11/2 secondary R intertrochanteric hip fx.  PMH includes Parkinson's, B DBS, bradycardia, PTSD, dysrhythmia, htn, sleep apnea, SOB, Dupuytren's contracture, h/o L-spine surgery.   Clinical Impression   [name] was seen for OT/PT co-evaluation/treatment this date, POD#1 from above surgery. Pt was generally modified independent in all ADLs prior to surgery. Per chart, he has made modifications to his home to support his continued ability to participate in meaningful occupations of daily life including a zero threshold shower, grab bars in his bathroom, a ramped entrance to his home and comfort height commode. Pt is eager to return to PLOF with less pain and improved safety and independence. Pt currently requires moderate assist +2 for bed mobility, STS transfers, and lower body ADL management due to pain and limited AROM of R hip as well as difficulty with motor planning and execution 2/2 his PD. Pt has been unable to progress to walking during his therapy sessions, but does have his U-Step walker available to use in his room.  Pt instructed in self care skills, falls prevention strategies, home/routines modifications, DME/AE for LB bathing and dressing tasks, and compression stocking mgt strategies. Pt would benefit from additional instruction in self care skills and techniques with or without assistive devices to support safety, functional independence, recall and carryover prior to discharge. Recommend STR upon hospital DC to maximize pt safety and return to PLOF.       Follow Up Recommendations  SNF    Equipment Recommendations  Other (comment)(TBD at next venue of care)    Recommendations for  Other Services       Precautions / Restrictions Precautions Precautions: Fall Restrictions Weight Bearing Restrictions: Yes RLE Weight Bearing: Weight bearing as tolerated      Mobility Bed Mobility Overal bed mobility: Needs Assistance Bed Mobility: Supine to Sit;Sit to Supine     Supine to sit: Mod assist;Max assist;+2 for physical assistance;HOB elevated Sit to supine: Mod assist;Max assist;+2 for physical assistance;HOB elevated   General bed mobility comments: assist for trunk and B LE's semi-supine to/from sit; vc's for technique  Transfers Overall transfer level: Needs assistance Equipment used: (U-step walker) Transfers: Sit to/from Stand Sit to Stand: Min assist;Mod assist;+2 physical assistance         General transfer comment: mod assist x2 first 2 trials standing from bed; min to mod assist x2 standing from bed x4 additional trials; B UE on U-step walker for support; B knees blocked 1st 2 trials standing; bed height elevated; assist to initiate and come to full stand; mild flexed posture noted in standing with B LE's pushing against bed to maintain upright posture; multimodal cueing to shift weight forward when standing (nose over toes) and for sitting posture (hips back and shoulders forward) when sitting    Balance Overall balance assessment: Needs assistance Sitting-balance support: Bilateral upper extremity supported;Feet supported Sitting balance-Leahy Scale: Poor Sitting balance - Comments: pt tending to have L and posterior lean; fluctuating between close SBA to mod assist for sitting balance Postural control: Left lateral lean Standing balance support: Bilateral upper extremity supported Standing balance-Leahy Scale: Poor Standing balance comment: pt with posterior lean in standing requiring 2 assist; mild L lean  ADL either performed or assessed with clinical judgement   ADL Overall ADL's : Needs  assistance/impaired                                       General ADL Comments: Pt requires mod +2 for bed/functional mobility this date. He has not yet been able to take steps since his surgery with a significant posterior lean once standing. He requires max assistance for seated LB ADL tasks 2/2 increased pain and decreased ROM in his RLE with +2 assist  to support seated balance.     Vision Baseline Vision/History: Wears glasses Wears Glasses: At all times Patient Visual Report: No change from baseline       Perception     Praxis      Pertinent Vitals/Pain Pain Assessment: Faces Faces Pain Scale: Hurts a little bit Pain Location: R hip/thigh Pain Descriptors / Indicators: Grimacing;Sore Pain Intervention(s): Limited activity within patient's tolerance;Monitored during session;Repositioned;Ice applied     Hand Dominance Right   Extremity/Trunk Assessment Upper Extremity Assessment Upper Extremity Assessment: RUE deficits/detail;LUE deficits/detail RUE Deficits / Details: RUE grossly 4/5, with Sims decreased but WFL, ROM WFL LUE Deficits / Details: LUE generally weaker than RUE grossly 3/5. Pt reports Dupuytren's contracture in his L hand at baseline with noted muscle atrophy. Pt able to use U-step walker this date without difficulty. Will continue to assess during functional activity. LUE Coordination: decreased fine motor   Lower Extremity Assessment Lower Extremity Assessment: Defer to PT evaluation;RLE deficits/detail RLE Deficits / Details: s/p R IM nail       Communication Communication Communication: Expressive difficulties(very soft spoken with increased time for processing)   Cognition Arousal/Alertness: Awake/alert Behavior During Therapy: Flat affect Overall Cognitive Status: No family/caregiver present to determine baseline cognitive functioning                                 General Comments: Pt oriented to self only. When  asked place he states "RTP". Requires increased time for processing information/questions and easily distracted by stimuli in room (e.g. looking out windows or at television). Requires consistent multimodal prompting.   General Comments       Exercises Total Joint Exercises Ankle Circles/Pumps: AROM;Strengthening;Both;10 reps;Supine Heel Slides: AAROM;Strengthening;Both;10 reps;Supine Hip ABduction/ADduction: AAROM;Strengthening;Both;10 reps;Supine Other Exercises Other Exercises: OT/PT engage pt in bed mobility, seated balance activities, and multiple STS trials this date. Other Exercises: Pt educated on role of OT in the acute care setting versus outpatient or home health, falls prevention strategies, and safe transfer techniques this date. Limited recall of information provided would benefit from additional education with caregiver present.   Shoulder Instructions      Home Living Family/patient expects to be discharged to:: Private residence Living Arrangements: Spouse/significant other Available Help at Discharge: Family;Available 24 hours/day Type of Home: House Home Access: Ramped entrance     Home Layout: One level     Bathroom Shower/Tub: Other (comment)(roll in shower)   Bathroom Toilet: Handicapped height     Home Equipment: Shower seat;Grab bars - tub/shower;Grab bars - toilet;Transport chair;Wheelchair - power;Wheelchair - manual          Prior Functioning/Environment Level of Independence: Independent with assistive device(s)        Comments: Pt either ambulates with U-step or no AD depending on how he is doing.  H/o  falls.  [name] reports having an aide 14 hours/week to assist (no physical assist for pt).        OT Problem List: Decreased strength;Decreased coordination;Pain;Decreased range of motion;Decreased activity tolerance;Decreased safety awareness;Decreased cognition;Impaired balance (sitting and/or standing);Decreased knowledge of use of DME or  AE;Decreased knowledge of precautions      OT Treatment/Interventions: Self-care/ADL training;Balance training;Therapeutic exercise;Therapeutic activities;Energy conservation;DME and/or AE instruction;Patient/family education    OT Goals(Current goals can be found in the care plan section) Acute Rehab OT Goals Patient Stated Goal: to improve mobility OT Goal Formulation: With patient Time For Goal Achievement: 03/05/19 Potential to Achieve Goals: Good ADL Goals Pt Will Perform Grooming: sitting;with supervision;with set-up(With LRAD PRN for improved safety and functional independence.) Pt Will Perform Upper Body Dressing: sitting;with min guard assist;with set-up;with supervision(With LRAD PRN for improved safety and functional independence.) Pt Will Perform Lower Body Dressing: sit to/from stand;with mod assist;with adaptive equipment(With LRAD PRN for improved safety and functional independence.)  OT Frequency: Min 2X/week   Barriers to D/C:            Co-evaluation PT/OT/SLP Co-Evaluation/Treatment: Yes Reason for Co-Treatment: Complexity of the patient's impairments (multi-system involvement);Necessary to address cognition/behavior during functional activity;For patient/therapist safety;To address functional/ADL transfers PT goals addressed during session: Mobility/safety with mobility;Proper use of DME;Strengthening/ROM OT goals addressed during session: ADL's and self-care;Proper use of Adaptive equipment and DME;Strengthening/ROM      AM-PAC OT "6 Clicks" Daily Activity     Outcome Measure Help from another person eating meals?: A Little Help from another person taking care of personal grooming?: A Little Help from another person toileting, which includes using toliet, bedpan, or urinal?: Total Help from another person bathing (including washing, rinsing, drying)?: Total Help from another person to put on and taking off regular upper body clothing?: A Little Help from  another person to put on and taking off regular lower body clothing?: A Lot 6 Click Score: 13   End of Session Equipment Utilized During Treatment: Gait belt;Rolling walker  Activity Tolerance: Patient tolerated treatment well Patient left: in bed;with call bell/phone within reach;with bed alarm set;with SCD's reapplied(With HOB locked at 30 degrees.)  OT Visit Diagnosis: Other abnormalities of gait and mobility (R26.89);History of falling (Z91.81);Pain Pain - Right/Left: Right Pain - part of body: Hip                Time: [id number] OT Time Calculation (min): 49 min Charges:  OT General Charges $OT Visit: 1 Visit OT Evaluation $OT Eval High Complexity: 1 High OT Treatments $Self Care/Home Management : 8-22 mins  Shara Blazing, M.S., OTR/L Ascom: [phone] 02/19/19, 4:10 PM

## 2019-02-19 NOTE — Progress Notes (Signed)
Subjective:  POD #1 s/p IM fixation for right IT hip fracture.   Patient reports right hip pain as mild to moderate.  Pain is improved today.   Patient more alert today.  Wife at the bedside and states he had difficulty getting up with PT today.  Patient sitting up in bed.  Objective:   VITALS:   Vitals:   02/18/19 2216 02/18/19 2337 02/19/19 0435 02/19/19 0805  BP: 113/76 139/63 139/70 132/72  Pulse: (!) 106 (!) 56 94 (!) 58  Resp:  16 16   Temp:    98 F (36.7 C)  TempSrc:    Oral  SpO2:  93% 100% 93%  Weight:      Height:        PHYSICAL EXAM: Right lower extremity Neurovascular intact Sensation intact distally Intact pulses distally Dorsiflexion/Plantar flexion intact Incision: scant drainage No cellulitis present Compartment soft  LABS  Results for orders placed or performed during the hospital encounter of 02/16/19 (from the past 24 hour(s))  CBC     Status: Abnormal   Collection Time: 02/19/19  5:18 AM  Result Value Ref Range   WBC 9.4 4.0 - 10.5 K/uL   RBC 3.92 (L) 4.22 - 5.81 MIL/uL   Hemoglobin 11.8 (L) 13.0 - 17.0 g/dL   HCT 34.5 (L) 39.0 - 52.0 %   MCV 88.0 80.0 - 100.0 fL   MCH 30.1 26.0 - 34.0 pg   MCHC 34.2 30.0 - 36.0 g/dL   RDW 12.9 11.5 - 15.5 %   Platelets 141 (L) 150 - 400 K/uL   nRBC 0.0 0.0 - 0.2 %  Basic metabolic panel     Status: Abnormal   Collection Time: 02/19/19  5:18 AM  Result Value Ref Range   Sodium 136 135 - 145 mmol/L   Potassium 3.3 (L) 3.5 - 5.1 mmol/L   Chloride 104 98 - 111 mmol/L   CO2 24 22 - 32 mmol/L   Glucose, Bld 153 (H) 70 - 99 mg/dL   BUN 19 8 - 23 mg/dL   Creatinine, Ser 0.88 0.61 - 1.24 mg/dL   Calcium 8.1 (L) 8.9 - 10.3 mg/dL   GFR calc non Af Amer >60 >60 mL/min   GFR calc Af Amer >60 >60 mL/min   Anion gap 8 5 - 15  Magnesium     Status: None   Collection Time: 02/19/19  5:18 AM  Result Value Ref Range   Magnesium 1.7 1.7 - 2.4 mg/dL    Dg Hip Port Unilat With Pelvis 1v Right  Result Date:  02/18/2019 CLINICAL DATA:  Postop intramedullary nailing of intertrochanteric fracture. EXAM: DG HIP (WITH OR WITHOUT PELVIS) 1V PORT RIGHT COMPARISON:  Intraoperative views of 02/18/2019 preoperative assessment of 02/16/2019 FINDINGS: AP and cross table lateral views show intramedullary nailing and femoral neck screw in similar position, no signs of hardware complication. Lucency over the right hip and skin staples compatible with recent surgical change. Femoral head remains located. Signs of lumbar spinal fusion are partially imaged. IMPRESSION: Post ORIF of right intertrochanteric fracture without complicating features. Electronically Signed   By: Zetta Bills M.D.   On: 02/18/2019 18:05   Dg Hip Operative Unilat W Or W/o Pelvis Right  Result Date: 02/18/2019 CLINICAL DATA:  Right hip item nail placement EXAM: OPERATIVE RIGHT HIP (WITH PELVIS IF PERFORMED) 4 VIEWS TECHNIQUE: Fluoroscopic spot image(s) were submitted for interpretation post-operatively. COMPARISON:  None. FLUOROSCOPY TIME:  Fluoroscopy Time:  42 seconds FINDINGS: Multiple intraoperative fluoroscopic  spot images are provided without a radiologist present. Interval placement of a right proximal femoral intramedullary nail and interlocking femoral neck screw. IMPRESSION: Intraoperative localization Electronically Signed   By: Kathreen Devoid   On: 02/18/2019 16:55    Assessment/Plan: 1 Day Post-Op   Active Problems:   Essential hypertension   Erectile dysfunction   HLD (hyperlipidemia)   Depression   GERD (gastroesophageal reflux disease)   Closed right hip fracture (HCC)  Continue current pain management and PT.   Dressing to be changed tomorrow.  Patient will continue lovenox and will need SNF upon discharge.    Thornton [place] , MD 02/19/2019, 11:11 AM

## 2019-02-19 NOTE — Care Management Important Message (Signed)
Important Message  Patient Details  Name: [name] MRN: [id number] Date of Birth: Mar 15, 1951   Medicare Important Message Given:  Yes     Juliann Pulse A Kennadi [place] 02/19/2019, 10:53 AM

## 2019-02-19 NOTE — TOC Progression Note (Signed)
Transition of Care [facility]) - Progression Note    Patient Details  Name: [name] MRN: [id number] Date of Birth: 1950/08/23  Transition of Care [facility]) CM/SW Contact  Su Hilt, RN Phone Number: 02/19/2019, 9:58 AM  Clinical Narrative:      Requested the price of Lovenox      Expected Discharge Plan and Services                                                 Social Determinants of Health (SDOH) Interventions    Readmission Risk Interventions No flowsheet data found.

## 2019-02-19 NOTE — TOC Benefit Eligibility Note (Signed)
Transition of Care [facility]) Benefit Eligibility Note    Patient Details  Name: [name] MRN: [id number] Date of Birth: [birth year]   Medication/Dose: Enoxaparin 40mg  once daily for 14 days  Covered?: Yes  Prescription Coverage Preferred Pharmacy: [id number] or [place] with Person/Company/Phone Number:: Geoffery Spruce from [employer] at [phone]  Co-Pay: $13.00 estimated copay  Prior Approval: No  Deductible: (No deductible.)    Dannette Barbara Phone Number: [phone] or [phone] 02/19/2019, 1:21 PM

## 2019-02-19 NOTE — TOC Progression Note (Signed)
Transition of Care [facility]) - Progression Note    Patient Details  Name: [name] MRN: [id number] Date of Birth: 03-11-1951  Transition of Care [facility]) CM/SW [place], RN Phone Number: 02/19/2019, 3:42 PM  Clinical Narrative:    Spoke with the patient's spouse and reviewed the bed offers that are in place so far.  She stated that she is concerned about [facility] due to them having covid in the past.  I explained that the patient's are all quarantined for 14 days when they first go into the facility.  She stated remembering that and agreed to review bed offers in the morning and make a choice    Expected Discharge Plan: [place] Barriers to Discharge: Ship broker, Continued Medical Work up, SNF Pending bed offer  Expected Discharge Plan and Services Expected Discharge Plan: [place]   Discharge Planning Services: CM Consult   Living arrangements for the past 2 months: Single Family Home                                       Social Determinants of Health (SDOH) Interventions    Readmission Risk Interventions No flowsheet data found.

## 2019-02-19 NOTE — Progress Notes (Signed)
Physical Therapy Treatment Patient Details Name: [name] MRN: [id number] DOB: Mar 23, 1951 Today's Date: 02/19/2019    History of Present Illness Pt is a 68 y.o. male presenting to hospital 02/16/19 s/p fall onto R hip when walking without walker and possibly hit head.  Pt s/p IM fixation 11/2 secondary R intertrochanteric hip fx.  PMH includes Parkinson's, B DBS, bradycardia, PTSD, dysrhythmia, htn, sleep apnea, SOB, Dupuytren's contracture, h/o L-spine surgery.    PT Comments    PT/OT co-treatment performed.  Mod to max assist x2 semi-supine to/from sit.  Initially pt mod assist x2 to stand from elevated bed but progressed to min to mod assist x2 standing with vc's for transfer technique required.  Improved upright noted in standing but pt still in mildly flexed posture standing with mild L lean.  Will continue to focus on strengthening and progressive functional mobility per pt tolerance.   Follow Up Recommendations  SNF     Equipment Recommendations  3in1 (PT);Wheelchair (measurements PT);Wheelchair cushion (measurements PT)    Recommendations for Other Services OT consult     Precautions / Restrictions Precautions Precautions: Fall Restrictions Weight Bearing Restrictions: Yes RLE Weight Bearing: Weight bearing as tolerated    Mobility  Bed Mobility Overal bed mobility: Needs Assistance Bed Mobility: Supine to Sit;Sit to Supine     Supine to sit: Mod assist;Max assist;+2 for physical assistance;HOB elevated Sit to supine: Mod assist;Max assist;+2 for physical assistance;HOB elevated   General bed mobility comments: assist for trunk and B LE's semi-supine to/from sit; vc's for technique  Transfers Overall transfer level: Needs assistance Equipment used: (U-step walker) Transfers: Sit to/from Stand Sit to Stand: Min assist;Mod assist;+2 physical assistance         General transfer comment: mod assist x2 first 2 trials standing from bed; min to mod assist x2  standing from bed x4 additional trials; B UE on U-step walker for support; B knees blocked 1st 2 trials standing; bed height elevated; assist to initiate and come to full stand; mild flexed posture noted in standing with B LE's pushing against bed to maintain upright posture; vc's to shift weight forward when standing (nose over toes) and for sitting posture (hips back and shoulders forward) when sitting  Ambulation/Gait Ambulation/Gait assistance: +2 physical assistance   Assistive device: (U-step walker)       General Gait Details: pt able to mildly move R LE forward and then L LE forward about an inch with opposite LE knee blocked with 2 assist   Stairs             Wheelchair Mobility    Modified Rankin (Stroke Patients Only)       Balance Overall balance assessment: Needs assistance Sitting-balance support: Bilateral upper extremity supported;Feet supported Sitting balance-Leahy Scale: Poor Sitting balance - Comments: pt tending to have L and posterior lean; fluctuating between close SBA to mod assist for sitting balance Postural control: Left lateral lean Standing balance support: Bilateral upper extremity supported Standing balance-Leahy Scale: Poor Standing balance comment: pt with posterior lean in standing requiring 2 assist; mild L lean                            Cognition Arousal/Alertness: Awake/alert Behavior During Therapy: Flat affect Overall Cognitive Status: (Oriented to person only)  Exercises     General Comments        Pertinent Vitals/Pain Pain Assessment: Faces Faces Pain Scale: Hurts a little bit Pain Location: R hip/thigh Pain Descriptors / Indicators: Sore Pain Intervention(s): Limited activity within patient's tolerance;Monitored during session;Repositioned;Ice applied    Home Living Family/patient expects to be discharged to:: Private residence Living Arrangements:  Spouse/significant other Available Help at Discharge: Family;Available 24 hours/day Type of Home: House Home Access: Ramped entrance   Home Layout: One level Home Equipment: Shower seat;Grab bars - tub/shower;Grab bars - toilet;Transport chair;Wheelchair - power;Wheelchair - manual(manual w/c with anti-tippers; U-step walker)      Prior Function Level of Independence: Independent with assistive device(s)      Comments: Pt either ambulates with U-step or no AD depending on how he is doing.  H/o falls.  [name] reports having an aide 14 hours/week to assist (no physical assist for pt).   PT Goals (current goals can now be found in the care plan section) Acute Rehab PT Goals Patient Stated Goal: to improve mobility PT Goal Formulation: With patient/family Time For Goal Achievement: 03/05/19 Potential to Achieve Goals: Good Progress towards PT goals: Progressing toward goals    Frequency    BID      PT Plan Current plan remains appropriate    Co-evaluation PT/OT/SLP Co-Evaluation/Treatment: Yes Reason for Co-Treatment: Complexity of the patient's impairments (multi-system involvement);Necessary to address cognition/behavior during functional activity;To address functional/ADL transfers;For patient/therapist safety PT goals addressed during session: Mobility/safety with mobility;Balance;Proper use of DME;Strengthening/ROM OT goals addressed during session: ADL's and self-care;Strengthening/ROM;Proper use of Adaptive equipment and DME      AM-PAC PT "6 Clicks" Mobility   Outcome Measure  Help needed turning from your back to your side while in a flat bed without using bedrails?: A Lot Help needed moving from lying on your back to sitting on the side of a flat bed without using bedrails?: Total Help needed moving to and from a bed to a chair (including a wheelchair)?: Total Help needed standing up from a chair using your arms (e.g., wheelchair or bedside chair)?: Total Help  needed to walk in hospital room?: Total Help needed climbing 3-5 steps with a railing? : Total 6 Click Score: 7    End of Session Equipment Utilized During Treatment: Gait belt Activity Tolerance: Patient limited by fatigue Patient left: in bed;with call bell/phone within reach;with bed alarm set;with SCD's reapplied;Other (comment)(B heels floating via towel rolls; ice pack to R hip) Nurse Communication: Mobility status;Precautions;Weight bearing status;Other (comment)(via white board) PT Visit Diagnosis: Other abnormalities of gait and mobility (R26.89);Muscle weakness (generalized) (M62.81);Repeated falls (R29.6);History of falling (Z91.81);Difficulty in walking, not elsewhere classified (R26.2);Pain Pain - Right/Left: Right Pain - part of body: Hip     Time: [id number] PT Time Calculation (min) (ACUTE ONLY): 31 min  Charges: $Therapeutic Activity: 23-37 mins                     Leitha Bleak, PT 02/19/19, 5:23 PM [phone]

## 2019-02-19 NOTE — Progress Notes (Signed)
PROGRESS NOTE    [name]  [id number] DOB: Dec 08, 1950 DOA: 02/16/2019 PCP: Tonia Ghent, MD   Brief Narrative:  68 year old with history of HTN, Perkins disease status post brain stimulator, bradycardia, PTSD, obstructive sleep apnea presented after mechanical fall with right hip fracture.  Tolerated ORIF well on 11/2.   Assessment & Plan:   Active Problems:   Essential hypertension   Erectile dysfunction   HLD (hyperlipidemia)   Depression   GERD (gastroesophageal reflux disease)   Closed right hip fracture (HCC)  Right hip fracture, closed status post fall -Status post ORIF 02/18/2019 -PT/OT today -Pain control, bowel regimen -Lovenox for postop DVT prophylaxis. Does not want to go to SNF, would rather go home with home health.  Dysphagia -Seen by speech therapist, mild aspiration risk.  Recommend dysphagia 3 diet.  History of Parkinson's disease -Continue Sinemet  History of anxiety/depression -Continue Lexapro and Klonopin  GERD -PPI   DVT prophylaxis: Postoperative prophylaxis per surgical team-Lovenox Code Status: Full code Family Communication: Wife is at the bedside Disposition Plan: Maintain hospital stay until PT/OT thereafter determine safe disposition  Consultants:   Orthopedic  Procedures:   ORIF 11/2  Antimicrobials:   Perioperative   Subjective: Slightly confused this morning secondary to delirium.  Afebrile, no other complaints.  Tolerated the procedure well.  Review of Systems Otherwise negative except as per HPI, including: General = no fevers, chills, dizziness, malaise, fatigue HEENT/EYES = negative for pain, redness, loss of vision, double vision, blurred vision, loss of hearing, sore throat, hoarseness, dysphagia Cardiovascular= negative for chest pain, palpitation, murmurs, lower extremity swelling Respiratory/lungs= negative for shortness of breath, cough, hemoptysis, wheezing, mucus production Gastrointestinal=  negative for nausea, vomiting,, abdominal pain, melena, hematemesis Genitourinary= negative for Dysuria, Hematuria, Change in Urinary Frequency MSK = Negative for arthralgia, myalgias, Back Pain, Joint swelling  Neurology= Negative for headache, seizures, numbness, tingling  Psychiatry= Negative for anxiety, depression, suicidal and homocidal ideation Allergy/Immunology= Medication/Food allergy as listed  Skin= Negative for Rash, lesions, ulcers, itching    Objective: Vitals:   02/18/19 2337 02/19/19 0435 02/19/19 0805 02/19/19 1136  BP: 139/63 139/70 132/72 130/63  Pulse: (!) 56 94 (!) 58 (!) 57  Resp: 16 16    Temp:   98 F (36.7 C) 98.6 F (37 C)  TempSrc:   Oral Oral  SpO2: 93% 100% 93% 93%  Weight:      Height:        Intake/Output Summary (Last 24 hours) at 02/19/2019 1156 Last data filed at 02/19/2019 1038 Gross per 24 hour  Intake 1590 ml  Output 800 ml  Net 790 ml   Filed Weights   02/16/19 1630 02/17/19 1959 02/18/19 1344  Weight: 83.9 kg 86.8 kg 86 kg    Examination: Constitutional: NAD, calm, comfortable Respiratory: Clear to auscultation bilaterally Cardiovascular: Normal sinus rhythm Abdomen: no tenderness, no masses palpated. No hepatosplenomegaly. Bowel sounds positive.  Musculoskeletal: no clubbing / cyanosis. No joint deformity upper and lower extremities. Good ROM, no contractures. Normal muscle tone.  Skin: Right hip dressing without evidence of bleeding ion Neurologic: CN 2-12 grossly intact.  No focal neuro deficits Psychiatric: Alert to his name only.  Pleasantly confused.    Data Reviewed:   CBC: Recent Labs  Lab 02/16/19 1640 02/17/19 0655 02/18/19 0333 02/19/19 0518  WBC 8.5 10.9* 8.8 9.4  NEUTROABS 6.2  --   --   --   HGB 14.6 14.1 13.1 11.8*  HCT 42.8 40.4 39.0 34.5*  MCV 89.4 88.0 89.4 88.0  PLT 182 164 150 [id number]*   Basic Metabolic Panel: Recent Labs  Lab 02/16/19 1640 02/17/19 0655 02/18/19 0333 02/19/19 0518  NA 140  140 139 136  K 4.0 3.4* 3.9 3.3*  CL 105 107 105 104  CO2 26 26 26 24   GLUCOSE 158* 124* 134* 153*  BUN 25* 22 21 19   CREATININE 0.92 0.98 0.87 0.88  CALCIUM 8.8* 8.6* 8.4* 8.1*  MG  --   --  2.1 1.7   GFR: Estimated Creatinine Clearance: 86.1 mL/min (by C-G formula based on SCr of 0.88 mg/dL). Liver Function Tests: No results for input(s): AST, ALT, ALKPHOS, BILITOT, PROT, ALBUMIN in the last 168 hours. No results for input(s): LIPASE, AMYLASE in the last 168 hours. No results for input(s): AMMONIA in the last 168 hours. Coagulation Profile: Recent Labs  Lab 02/16/19 1640  INR 1.1   Cardiac Enzymes: No results for input(s): CKTOTAL, CKMB, CKMBINDEX, TROPONINI in the last 168 hours. BNP (last 3 results) No results for input(s): PROBNP in the last 8760 hours. HbA1C: No results for input(s): HGBA1C in the last 72 hours. CBG: No results for input(s): GLUCAP in the last 168 hours. Lipid Profile: No results for input(s): CHOL, HDL, LDLCALC, TRIG, CHOLHDL, LDLDIRECT in the last 72 hours. Thyroid Function Tests: No results for input(s): TSH, T4TOTAL, FREET4, T3FREE, THYROIDAB in the last 72 hours. Anemia Panel: No results for input(s): VITAMINB12, FOLATE, FERRITIN, TIBC, IRON, RETICCTPCT in the last 72 hours. Sepsis Labs: No results for input(s): PROCALCITON, LATICACIDVEN in the last 168 hours.  Recent Results (from the past 240 hour(s))  SARS Coronavirus 2 by RT PCR (hospital order, performed in [facility] hospital lab) Nasopharyngeal Nasopharyngeal Swab     Status: None   Collection Time: 02/16/19  4:40 PM   Specimen: Nasopharyngeal Swab  Result Value Ref Range Status   SARS Coronavirus 2 NEGATIVE NEGATIVE Final    Comment: (NOTE) If result is NEGATIVE SARS-CoV-2 target nucleic acids are NOT DETECTED. The SARS-CoV-2 RNA is generally detectable in upper and lower  respiratory specimens during the acute phase of infection. The lowest  concentration of SARS-CoV-2 viral  copies this assay can detect is 250  copies / mL. A negative result does not preclude SARS-CoV-2 infection  and should not be used as the sole basis for treatment or other  patient management decisions.  A negative result may occur with  improper specimen collection / handling, submission of specimen other  than nasopharyngeal swab, presence of viral mutation(s) within the  areas targeted by this assay, and inadequate number of viral copies  (<250 copies / mL). A negative result must be combined with clinical  observations, patient history, and epidemiological information. If result is POSITIVE SARS-CoV-2 target nucleic acids are DETECTED. The SARS-CoV-2 RNA is generally detectable in upper and lower  respiratory specimens dur ing the acute phase of infection.  Positive  results are indicative of active infection with SARS-CoV-2.  Clinical  correlation with patient history and other diagnostic information is  necessary to determine patient infection status.  Positive results do  not rule out bacterial infection or co-infection with other viruses. If result is PRESUMPTIVE POSTIVE SARS-CoV-2 nucleic acids MAY BE PRESENT.   A presumptive positive result was obtained on the submitted specimen  and confirmed on repeat testing.  While 2019 novel coronavirus  (SARS-CoV-2) nucleic acids may be present in the submitted sample  additional confirmatory testing may be necessary for epidemiological  and /  or clinical management purposes  to differentiate between  SARS-CoV-2 and other Sarbecovirus currently known to infect humans.  If clinically indicated additional testing with an alternate test  methodology [phone]) is advised. The SARS-CoV-2 RNA is generally  detectable in upper and lower respiratory sp ecimens during the acute  phase of infection. The expected result is Negative. Fact Sheet for Patients:  StrictlyIdeas.no Fact Sheet for Healthcare  Providers: BankingDealers.co.za This test is not yet approved or cleared by the [place] FDA and has been authorized for detection and/or diagnosis of SARS-CoV-2 by FDA under an Emergency Use Authorization (EUA).  This EUA will remain in effect (meaning this test can be used) for the duration of the COVID-19 declaration under Section 564(b)(1) of the Act, 21 U.S.C. section 360bbb-3(b)(1), unless the authorization is terminated or revoked sooner. Performed at [facility], [address] [place], [place] [address]   Surgical pcr screen     Status: None   Collection Time: 02/17/19  8:27 PM   Specimen: Nasal Mucosa; Nasal Swab  Result Value Ref Range Status   MRSA, PCR NEGATIVE NEGATIVE Final   Staphylococcus aureus NEGATIVE NEGATIVE Final    Comment: (NOTE) The Xpert SA Assay (FDA approved for NASAL specimens in patients 50 years of age and older), is one component of a comprehensive surveillance program. It is not intended to diagnose infection nor to guide or monitor treatment. Performed at [facility], [address] [place], [place] [address]          Radiology Studies: Dg Hip Port Unilat With Pelvis 1v Right  Result Date: 02/18/2019 CLINICAL DATA:  Postop intramedullary nailing of intertrochanteric fracture. EXAM: DG HIP (WITH OR WITHOUT PELVIS) 1V PORT RIGHT COMPARISON:  Intraoperative views of 02/18/2019 preoperative assessment of 02/16/2019 FINDINGS: AP and cross table lateral views show intramedullary nailing and femoral neck screw in similar position, no signs of hardware complication. Lucency over the right hip and skin staples compatible with recent surgical change. Femoral head remains located. Signs of lumbar spinal fusion are partially imaged. IMPRESSION: Post ORIF of right intertrochanteric fracture without complicating features. Electronically Signed   By: Zetta Bills M.D.   On: 02/18/2019 18:05   Dg Hip Operative  Unilat W Or W/o Pelvis Right  Result Date: 02/18/2019 CLINICAL DATA:  Right hip item nail placement EXAM: OPERATIVE RIGHT HIP (WITH PELVIS IF PERFORMED) 4 VIEWS TECHNIQUE: Fluoroscopic spot image(s) were submitted for interpretation post-operatively. COMPARISON:  None. FLUOROSCOPY TIME:  Fluoroscopy Time:  42 seconds FINDINGS: Multiple intraoperative fluoroscopic spot images are provided without a radiologist present. Interval placement of a right proximal femoral intramedullary nail and interlocking femoral neck screw. IMPRESSION: Intraoperative localization Electronically Signed   By: Kathreen Devoid   On: 02/18/2019 16:55        Scheduled Meds:  acetaminophen  1,000 mg Oral Q6H   carbidopa-levodopa  1 tablet Oral BID AC   carbidopa-levodopa  2 tablet Oral Q breakfast   cholecalciferol  1,000 Units Oral Daily   docusate sodium  100 mg Oral BID   enoxaparin (LOVENOX) injection  40 mg Subcutaneous Q24H   escitalopram  10 mg Oral Daily   ketorolac  7.5 mg Intravenous Q6H   Melatonin  10 mg Oral QHS   memantine  10 mg Oral BID   pantoprazole  40 mg Oral Daily   potassium chloride  40 mEq Oral Once   traMADol  50 mg Oral Q6H   Continuous Infusions:  sodium chloride 75 mL/hr  at 02/19/19 0756   methocarbamol (ROBAXIN) IV       LOS: 3 days   Time spent= 25 mins    Nakyla Bracco Arsenio Loader, MD [facility]  If 7PM-7AM, please contact night-coverage  02/19/2019, 11:56 AM

## 2019-02-19 NOTE — Progress Notes (Signed)
Verbal order from MD to monitor urine until bedtime and no output may in and out cath >330mls

## 2019-02-19 NOTE — Evaluation (Signed)
Physical Therapy Evaluation Patient Details Name: [name] MRN: [id number] DOB: [birth year] Today's Date: 02/19/2019   History of Present Illness  Pt is a 68 y.o. male presenting to hospital 02/16/19 s/p fall onto R hip when walking without walker and possibly hit head.  Pt s/p IM fixation 11/2 secondary R intertrochanteric hip fx.  PMH includes Parkinson's, B DBS, bradycardia, PTSD, dysrhythmia, htn, sleep apnea, SOB, Dupuytren's contracture, h/o L-spine surgery.  Clinical Impression  Prior to hospital admission, pt was ambulatory (sometimes used U-step walker and sometimes no AD).  Pt lives with his wife in 1 level home with ramp entrance.  Currently pt is 2 assist with bed mobility and standing up to U-step walker (pt in flexed standing posture with posterior and L lean); unable to take any steps with walker and 2 assist.  Pt noted with L lean (sitting more than standing)--pt's wife reports pt with h/o L lean.  Pt would benefit from skilled PT to address noted impairments and functional limitations (see below for any additional details).  Upon hospital discharge, pt would benefit from STR (pt's wife would like pt to discharge home if possible).    Follow Up Recommendations SNF    Equipment Recommendations  3in1 (PT);Wheelchair (measurements PT);Wheelchair cushion (measurements PT)(pt already has U-step)    Recommendations for Other Services OT consult     Precautions / Restrictions Precautions Precautions: Fall Restrictions Weight Bearing Restrictions: Yes RLE Weight Bearing: Weight bearing as tolerated      Mobility  Bed Mobility Overal bed mobility: Needs Assistance Bed Mobility: Supine to Sit;Sit to Supine     Supine to sit: Mod assist;Max assist;+2 for physical assistance;HOB elevated Sit to supine: Mod assist;Max assist;+2 for physical assistance;HOB elevated   General bed mobility comments: assist for trunk and B LE's semi-supine to sit; vc's for  technique  Transfers Overall transfer level: Needs assistance Equipment used: None(U-step walker) Transfers: Sit to/from Stand Sit to Stand: Max assist;+2 physical assistance         General transfer comment: x2 trials standing up to U-step walker; pt placing B UE's on walker to assist with standing; assist to initiate stand; pt coming to grossly 3/4th stand and requiring cueing for upright posture (pt in flexed posture in general)  Ambulation/Gait             General Gait Details: pt unable to take any steps with 2 assist and B UE support on U-step (walker)  Stairs            Wheelchair Mobility    Modified Rankin (Stroke Patients Only)       Balance Overall balance assessment: Needs assistance Sitting-balance support: Bilateral upper extremity supported;Feet supported Sitting balance-Leahy Scale: Poor Sitting balance - Comments: pt tending to have L lean (pt's wife reports pt has h/o L lean) requiring assist fluctuating from CGA to mod assist for sitting balance; vc's for midline required Postural control: Left lateral lean Standing balance support: Bilateral upper extremity supported Standing balance-Leahy Scale: Poor Standing balance comment: pt with posterior lean in standing requiring 2 assist; pt also noted with L lean in standing                             Pertinent Vitals/Pain Pain Assessment: 0-10 Pain Location: R hip/thigh Pain Descriptors / Indicators: Sore Pain Intervention(s): Limited activity within patient's tolerance;Monitored during session;Repositioned     Home Living Family/patient expects to be discharged to:: Private  residence Living Arrangements: Spouse/significant other Available Help at Discharge: Family;Available 24 hours/day Type of Home: House Home Access: Ramped entrance     Home Layout: One level Home Equipment: Shower seat;Grab bars - tub/shower;Grab bars - toilet;Transport chair;Wheelchair - power;Wheelchair -  manual(manual w/c with anti-tippers; U-step walker)      Prior Function Level of Independence: Independent with assistive device(s)         Comments: Pt either ambulates with U-step or no AD depending on how he is doing.  H/o falls.  [name] reports having an aide 14 hours/week to assist (no physical assist for pt).     Hand Dominance        Extremity/Trunk Assessment   Upper Extremity Assessment Upper Extremity Assessment: Defer to OT evaluation    Lower Extremity Assessment Lower Extremity Assessment: (pt overall appearing "stiff" with LE movement with increased difficulty with initiation of movement; limited R hip movement d/t R hip/thigh pain)       Communication   Communication: Expressive difficulties(low voice volume)  Cognition Arousal/Alertness: (pt intermittently falling asleep in bed beginning/end of session) Behavior During Therapy: Flat affect Overall Cognitive Status: (Oriented to person (rest not assessed))                                        General Comments   Nursing cleared pt for participation in physical therapy.  Pt agreeable to PT session.  [name] present during session.    Exercises Total Joint Exercises Ankle Circles/Pumps: AROM;Strengthening;Both;10 reps;Supine Heel Slides: AAROM;Strengthening;Both;10 reps;Supine Hip ABduction/ADduction: AAROM;Strengthening;Both;10 reps;Supine   Assessment/Plan    PT Assessment Patient needs continued PT services  PT Problem List Decreased strength;Decreased range of motion;Decreased activity tolerance;Decreased balance;Decreased mobility;Decreased knowledge of use of DME;Decreased knowledge of precautions;Pain;Decreased skin integrity       PT Treatment Interventions DME instruction;Gait training;Functional mobility training;Therapeutic activities;Therapeutic exercise;Balance training;Neuromuscular re-education;Patient/family education    PT Goals (Current goals can be found in the  Care Plan section)  Acute Rehab PT Goals Patient Stated Goal: to improve mobility PT Goal Formulation: With patient/family Time For Goal Achievement: 03/05/19 Potential to Achieve Goals: Good    Frequency BID   Barriers to discharge Decreased caregiver support      Co-evaluation               AM-PAC PT "6 Clicks" Mobility  Outcome Measure Help needed turning from your back to your side while in a flat bed without using bedrails?: A Lot Help needed moving from lying on your back to sitting on the side of a flat bed without using bedrails?: Total Help needed moving to and from a bed to a chair (including a wheelchair)?: Total Help needed standing up from a chair using your arms (e.g., wheelchair or bedside chair)?: Total Help needed to walk in hospital room?: Total Help needed climbing 3-5 steps with a railing? : Total 6 Click Score: 7    End of Session Equipment Utilized During Treatment: Gait belt Activity Tolerance: Patient limited by fatigue Patient left: in bed;with call bell/phone within reach;with bed alarm set;with family/visitor present;with SCD's reapplied;Other (comment)(B heels floating via towel roll) Nurse Communication: Mobility status;Precautions;Weight bearing status PT Visit Diagnosis: Other abnormalities of gait and mobility (R26.89);Muscle weakness (generalized) (M62.81);Repeated falls (R29.6);History of falling (Z91.81);Difficulty in walking, not elsewhere classified (R26.2);Pain Pain - Right/Left: Right Pain - part of body: Hip    Time: [id number]  PT Time Calculation (min) (ACUTE ONLY): 35 min   Charges:   PT Evaluation $PT Eval Low Complexity: 1 Low PT Treatments $Therapeutic Exercise: 8-22 mins $Therapeutic Activity: 8-22 mins       Leitha Bleak, PT 02/19/19, 1:22 PM [phone]

## 2019-02-19 NOTE — Progress Notes (Signed)
Foley removed. Pt tolerated well. Pdowless, rn

## 2019-02-19 NOTE — Evaluation (Signed)
Clinical/Bedside Swallow Evaluation Patient Details  Name: [name] MRN: [id number] Date of Birth: 29-Aug-1950  Today's Date: 02/19/2019 Time: SLP Start Time (ACUTE ONLY): 1046 SLP Stop Time (ACUTE ONLY): 1146 SLP Time Calculation (min) (ACUTE ONLY): 60 min  Past Medical History:  Past Medical History:  Diagnosis Date  . Arthritis   . Bradycardia   . Cancer [facility]) 2013   skin cancer  . Depression    ptsd  . Dysrhythmia    chronic slow heart rate  . GERD (gastroesophageal reflux disease)   . Headache(784.0)    tension headaches non recent  . History of chicken pox   . History of kidney stones    passed  . Hypertension    treated with HCTZ  . Parkinson's disease ([place])    dx'ed 15 years ago  . PTSD (post-traumatic stress disorder)   . Shortness of breath dyspnea   . Sleep apnea    doesn't use C-pap  . Varicose veins    Past Surgical History:  Past Surgical History:  Procedure Laterality Date  . CHOLECYSTECTOMY N/A 10/22/2014   Procedure: LAPAROSCOPIC CHOLECYSTECTOMY WITH INTRAOPERATIVE CHOLANGIOGRAM;  Surgeon: Dia Crawford III, MD;  Location: [facility] ORS;  Service: General;  Laterality: N/A;  . cyst removed      from lip as a child  . LUMBAR LAMINECTOMY/DECOMPRESSION MICRODISCECTOMY Bilateral 12/14/2012   Procedure: Bilateral lumbar three-four, four-five decompressive laminotomy/foraminotomy;  Surgeon: Charlie Pitter, MD;  Location: [place] NEURO ORS;  Service: Neurosurgery;  Laterality: Bilateral;  . PULSE GENERATOR IMPLANT Bilateral 12/13/2013   Procedure: Bilateral implantable pulse generator placement;  Surgeon: Erline Levine, MD;  Location: [place] [facility]URO ORS;  Service: Neurosurgery;  Laterality: Bilateral;  Bilateral implantable pulse generator placement  . skin cancer removed     from ears,   12 lft arm  rt leg 15  . SUBTHALAMIC STIMULATOR BATTERY REPLACEMENT Bilateral 07/14/2017   Procedure: BILATERAL IMPLANTED PULSE GENERATOR CHANGE FOR DEEP BRAIN STIMULATOR;  Surgeon: Erline Levine,  MD;  Location: [place];  Service: Neurosurgery;  Laterality: Bilateral;  . SUBTHALAMIC STIMULATOR INSERTION Bilateral 12/06/2013   Procedure: SUBTHALAMIC STIMULATOR INSERTION;  Surgeon: Erline Levine, MD;  Location: [place] NEURO ORS;  Service: Neurosurgery;  Laterality: Bilateral;  Bilateral deep brain stimulator placement   HPI:  H&P 02/16/2019: [name]  is a 68 y.o. male with a known history of essential hypertension, Parkinson's disease status post brain stimulator, bradycardia, PTSD, obstructive sleep apnea who presents to the hospital after mechanical fall noted to have a right hip fracture.  Patient was in his usual state of health and was walking in his backyard without his walker when he lost his footing and fell to the ground.  He did not lose consciousness, he had no head trauma he had no prodromal symptoms of chest pain, shortness of breath palpitations or any other associated symptoms.  He presented to the ER and underwent x-ray which showed right hip fracture.  Hospitalist services were contacted for admission.  Chest X-ray clear.  Surgery for hip fracture 02/18/2019.  Last MBS April/2017 with recommendation for a regular diet with thin liquids.  Per patient and wife- no problems with this diet until this hospital admission.   Assessment / Plan / Recommendation Clinical Impression  The patient has baseline mild oropharyngeal dysphagia secondary Parkinson's disease.  He has tolerated a regular diet with thin liquids for the past 3 years (last MBS April/2017).  The patient was admitted 11/31/2020 with a hip fracture with surgery 02/18/2019.  He is lethargic secondary pain medication and anesthesia.   Today the patient demonstrated an immediate cough with thin liquid sip and no cough with nectar-thick liquid.  He is able to take puree and swallow with no overt clinical indicators of aspiration and no oral residue.  He is able to bite and chew a banana and swallow with no clinical indicators of  aspiration or oral residue.  Recommend dysphagia 3 diet with minced meats and nectar-thick liquid.  Patient and wife agreeable to this plan.  Anticipate return to usual diet as his medical status improves.  SLP will follow for diet toleration and management.     SLP Visit Diagnosis: Dysphagia, oropharyngeal phase (R13.12)    Aspiration Risk  Mild aspiration risk    Diet Recommendation Dysphagia 3 (Mech soft);Nectar-thick liquid;Other (Comment)(Minced meats)   Liquid Administration via: Spoon;Cup Medication Administration: Crushed with puree Supervision: Full supervision/cueing for compensatory strategies Compensations: Minimize environmental distractions;Slow rate;Small sips/bites Postural Changes: Seated upright at 90 degrees;Remain upright for at least 30 minutes after po intake    Other  Recommendations Oral Care Recommendations: Oral care BID;Staff/trained caregiver to provide oral care   Follow up Recommendations Other (comment)(TBD)      Frequency and Duration min 3x week  2 weeks       Prognosis Prognosis for Safe Diet Advancement: Good Barriers to Reach Goals: Medication      Swallow Study   General Date of Onset: 02/16/19 HPI: H&P 02/16/2019: [name]  is a 68 y.o. male with a known history of essential hypertension, Parkinson's disease status post brain stimulator, bradycardia, PTSD, obstructive sleep apnea who presents to the hospital after mechanical fall noted to have a right hip fracture.  Patient was in his usual state of health and was walking in his backyard without his walker when he lost his footing and fell to the ground.  He did not lose consciousness, he had no head trauma he had no prodromal symptoms of chest pain, shortness of breath palpitations or any other associated symptoms.  He presented to the ER and underwent x-ray which showed right hip fracture.  Hospitalist services were contacted for admission.  Chest X-ray clear.  Surgery for hip fracture  02/18/2019.  Last MBS April/2017 with recommendation for a regular diet with thin liquids.  Per patient and wife- no problems with this diet until this hospital admission. Type of Study: Bedside Swallow Evaluation Previous Swallow Assessment: MBS 2017 Diet Prior to this Study: Regular;Thin liquids Behavior/Cognition: Lethargic/Drowsy;Cooperative Oral Cavity Assessment: Within Functional Limits Self-Feeding Abilities: Total assist Baseline Vocal Quality: Normal Volitional Cough: Strong Volitional Swallow: Unable to elicit    Oral/Motor/Sensory Function     Ice Chips     Thin Liquid Thin Liquid: Impaired Presentation: Spoon Pharyngeal  Phase Impairments: Cough - Immediate    Nectar Thick Nectar Thick Liquid: Within functional limits Presentation: Spoon   Honey Thick     Puree Puree: Within functional limits Presentation: Spoon   Solid     Solid: Impaired Presentation: Self Fed Oral Phase Impairments: Impaired mastication Oral Phase Functional Implications: Prolonged oral transit;Impaired mastication     Leroy Sea, MS/CCC- SLP  Valetta Fuller, Susie 02/19/2019,11:47 AM

## 2019-02-19 NOTE — NC FL2 (Signed)
[place] LEVEL OF CARE SCREENING TOOL     IDENTIFICATION  Patient Name: [name] Birthdate: [birth year] Sex: male Admission Date (Current Location): 02/16/2019  [place] and [place] Number:  Engineering geologist and Address:  [facility], [address], [place], [place] [address]      Provider Number: [id number]  Attending Physician Name and Address:  Damita Lack, MD  Relative Name and Phone Number:  [name] SPouse [phone]    Current Level of Care: Hospital Recommended Level of Care: [place] Prior Approval Number:    Date Approved/Denied:   PASRR Number: [id number]  Discharge Plan: SNF    Current Diagnoses: Patient Active Problem List   Diagnosis Date Noted  . Closed right hip fracture ([place]) 02/16/2019  . Rib pain on right side 09/25/2018  . Skin sore 12/01/2017  . Hematoma 10/18/2017  . Fall at home 10/18/2017  . 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 Radicular pain in right arm 10/09/2016  . GERD (gastroesophageal reflux disease) 07/31/2016  . Trochanteric bursitis 03/03/2016  . Left hand pain 10/30/2015  . Fracture, finger, multiple sites 10/30/2015  . Colon cancer screening 07/23/2015  . Healthcare maintenance 07/23/2015  . Gout 02/18/2015  . Depression 01/06/2015  . Irritation of eyelid 08/20/2014  . Dupuytren's contracture 08/20/2014  . Cough 07/21/2014  . Lumbar stenosis with neurogenic claudication 06/13/2014  . Preop cardiovascular exam 05/28/2014  . S/P deep brain stimulator placement 05/08/2014  . Joint pain 01/22/2014  . Medicare annual wellness visit, initial 01/19/2014  . Advance care planning 01/19/2014  . Parkinson's disease ([place]) 12/13/2013  . PTSD (post-traumatic stress disorder) 06/13/2013  . Erectile dysfunction 06/13/2013  . HLD (hyperlipidemia) 06/13/2013  . Hip pain 06/10/2013  . Pain in joint, shoulder region 06/10/2013  . Right leg swelling 06/03/2013  .  Essential hypertension 06/03/2013  . Bradycardia by electrocardiogram 06/03/2013  . Obstructive sleep apnea 03/12/2013  . Spinal stenosis, lumbar region, with neurogenic claudication 12/14/2012    Orientation RESPIRATION BLADDER Height & Weight     Self, Time, Situation, Place    Continent Weight: 86 kg Height:  5\' 9"  (175.3 cm)  BEHAVIORAL SYMPTOMS/MOOD NEUROLOGICAL BOWEL NUTRITION STATUS      Continent    AMBULATORY STATUS COMMUNICATION OF NEEDS Skin   Extensive Assist Verbally Surgical wounds                       Personal Care Assistance Level of Assistance  Dressing, Bathing, Feeding Bathing Assistance: Limited assistance Feeding assistance: Limited assistance Dressing Assistance: Limited assistance     Functional Limitations Info             SPECIAL CARE FACTORS FREQUENCY  PT (By licensed PT)     PT Frequency: 5 times per week              Contractures Contractures Info: Not present    Additional Factors Info  Allergies, Code Status Code Status Info: full code Allergies Info: Klonopin Clonazepam, Morphine And Related, Prednisone           Current Medications (02/19/2019):  This is the current hospital active medication list Current Facility-Administered Medications  Medication Dose Route Frequency Provider Last Rate Last Dose  . 0.9 %  sodium chloride infusion   Intravenous Continuous Damita Lack, MD 75 mL/hr at 02/19/19 0756    . acetaminophen (TYLENOL) tablet 1,000 mg  1,000 mg Oral Q6H [place], MD  1,000 mg at 02/19/19 0404  . acetaminophen (TYLENOL) tablet 325-650 mg  325-650 mg Oral Q6H PRN [place], MD      . alum & mag hydroxide-simeth (MAALOX/MYLANTA) 200-200-20 MG/5ML suspension 30 mL  30 mL Oral Q4H PRN [place], MD      . bisacodyl (DULCOLAX) suppository 10 mg  10 mg Rectal Daily PRN [place], MD      . carbidopa-levodopa (SINEMET IR) 25-100 MG per tablet immediate release 1 tablet  1 tablet Oral  BID AC [place], MD   1 tablet at 02/18/19 2221  . carbidopa-levodopa (SINEMET IR) 25-100 MG per tablet immediate release 2 tablet  2 tablet Oral Q breakfast [place], MD   2 tablet at 02/19/19 0904  . cholecalciferol (VITAMIN D3) tablet 1,000 Units  1,000 Units Oral Daily [place], MD   1,000 Units at 02/19/19 0904  . docusate sodium (COLACE) capsule 100 mg  100 mg Oral BID [place], MD   100 mg at 02/19/19 0904  . enoxaparin (LOVENOX) injection 40 mg  40 mg Subcutaneous Q24H [place], MD   40 mg at 02/19/19 0752  . escitalopram (LEXAPRO) tablet 10 mg  10 mg Oral Daily [place], MD   10 mg at 02/19/19 0904  . hydrALAZINE (APRESOLINE) injection 10 mg  10 mg Intravenous Q4H PRN [place], MD      . HYDROmorphone (DILAUDID) injection 0.5-1 mg  0.5-1 mg Intravenous Q4H PRN [place], MD      . ipratropium-albuterol (DUONEB) 0.5-2.5 (3) MG/3ML nebulizer solution 3 mL  3 mL Nebulization Q4H PRN [place], MD      . ketorolac (TORADOL) 15 MG/ML injection 7.5 mg  7.5 mg Intravenous Q6H [place], MD   7.5 mg at 02/19/19 0902  . magnesium citrate solution 1 Bottle  1 Bottle Oral Once PRN [place], MD      . Melatonin TABS 10 mg  10 mg Oral QHS [place], MD      . memantine [facility]) tablet 10 mg  10 mg Oral BID [place], MD   10 mg at 02/19/19 0904  . menthol-cetylpyridinium (CEPACOL) lozenge 3 mg  1 lozenge Oral PRN [place], MD       Or  . phenol (CHLORASEPTIC) mouth spray 1 spray  1 spray Mouth/Throat PRN [place], MD      . methocarbamol (ROBAXIN) tablet 500 mg  500 mg Oral Q6H PRN [place], MD       Or  . methocarbamol (ROBAXIN) 500 mg in dextrose 5 % 50 mL IVPB  500 mg Intravenous Q6H PRN [place], MD      . ondansetron [facility]) tablet 4 mg  4 mg Oral Q6H PRN [place], MD       Or  . ondansetron [facility]) injection 4 mg  4 mg Intravenous Q6H PRN [place], MD      . oxyCODONE (Oxy IR/ROXICODONE) immediate release tablet 5-10 mg  5-10 mg Oral Q4H PRN [place], MD   10 mg at 02/18/19 2035  . pantoprazole (PROTONIX) EC tablet 40 mg  40 mg Oral Daily [place] Park, MD   40 mg at 02/19/19 0904  . polyethylene glycol (MIRALAX / GLYCOLAX) packet 17 g  17 g Oral Daily PRN [place], MD   17 g at 02/19/19 0904  . potassium chloride (KLOR-CON) packet 40 mEq  40 mEq Oral Once Damita Lack, MD      .  traMADol (ULTRAM) tablet 50 mg  50 mg Oral Q6H [place], MD   50 mg at 02/19/19 [id number]     Discharge Medications: Please see discharge summary for a list of discharge medications.  Relevant Imaging Results:  Relevant Lab Results:   Additional Information SS# [id number]  Su Hilt, RN

## 2019-02-20 ENCOUNTER — Inpatient Hospital Stay: Payer: Medicare PPO

## 2019-02-20 ENCOUNTER — Ambulatory Visit: Payer: Medicare PPO

## 2019-02-20 ENCOUNTER — Ambulatory Visit: Payer: Medicare PPO | Admitting: Occupational Therapy

## 2019-02-20 LAB — BASIC METABOLIC PANEL
Anion gap: 10 (ref 5–15)
BUN: 19 mg/dL (ref 8–23)
CO2: 21 mmol/L — ABNORMAL LOW (ref 22–32)
Calcium: 7.9 mg/dL — ABNORMAL LOW (ref 8.9–10.3)
Chloride: 105 mmol/L (ref 98–111)
Creatinine, Ser: 0.77 mg/dL (ref 0.61–1.24)
GFR calc Af Amer: >60 mL/min (ref >60)
GFR calc non Af Amer: >60 mL/min (ref >60)
Glucose, Bld: 120 mg/dL — ABNORMAL HIGH (ref 70–99)
Potassium: 4 mmol/L (ref 3.5–5.1)
Sodium: 136 mmol/L (ref 135–145)

## 2019-02-20 LAB — CBC
HCT: 33.9 % — ABNORMAL LOW (ref 39.0–52.0)
Hemoglobin: 11.8 g/dL — ABNORMAL LOW (ref 13.0–17.0)
MCH: 30.7 pg (ref 26.0–34.0)
MCHC: 34.8 g/dL (ref 30.0–36.0)
MCV: 88.3 fL (ref 80.0–100.0)
Platelets: 154 10*3/uL (ref 150–400)
RBC: 3.84 MIL/uL — ABNORMAL LOW (ref 4.22–5.81)
RDW: 12.8 % (ref 11.5–15.5)
WBC: 10.2 10*3/uL (ref 4.0–10.5)
nRBC: 0 % (ref 0.0–0.2)

## 2019-02-20 LAB — MAGNESIUM: Magnesium: 2 mg/dL (ref 1.7–2.4)

## 2019-02-20 MED ORDER — AMOXICILLIN-POT CLAVULANATE 875-125 MG PO TABS: 1.0000 | ORAL_TABLET | Freq: Two times a day (BID) | ORAL | Status: DC

## 2019-02-20 MED ORDER — ACETAMINOPHEN 650 MG RE SUPP
325.0000 mg | Freq: Four times a day (QID) | RECTAL | Status: DC | PRN
  Administered 2019-02-23: 10:00:00 325 mg via RECTAL
  Filled 2019-02-20 (×3): qty 1

## 2019-02-20 MED ORDER — SODIUM CHLORIDE 0.9 % IV SOLN
3.0000 g | Freq: Four times a day (QID) | INTRAVENOUS | Status: DC
  Administered 2019-02-21 – 2019-02-22 (×5): 3 g via INTRAVENOUS
  Filled 2019-02-20 (×2): qty 3
  Filled 2019-02-20 (×3): qty 8
  Filled 2019-02-20: qty 3
  Filled 2019-02-20 (×3): qty 8

## 2019-02-20 NOTE — Progress Notes (Signed)
PT Cancellation Note  Patient Details Name: [name] MRN: [id number] DOB: 1950/05/01   Cancelled Treatment:    Reason Eval/Treat Not Completed: Fatigue/lethargy limiting ability to participate. Treatment attempted; pt known well to this therapist from prior outpatient aquatic sessions. Pt spouse in the room; together we attempted to awaken pt for participation; however, pt only responds with several grunts and lifting eyebrows. Re attempt at a later time as pt able and schedule allows.    Larae Grooms, PTA 02/20/2019, 4:50 PM

## 2019-02-20 NOTE — Progress Notes (Addendum)
PROGRESS NOTE    [name]  [id number] DOB: [birth year] DOA: 02/16/2019  PCP: Tonia Ghent, MD    LOS - 4   Brief Narrative:  68 year old with history of HTN, Perkins disease status post brain stimulator, bradycardia, PTSD, obstructive sleep apnea presented after mechanical fall with right hip fracture.  Tolerated ORIF well on 11/2.  Developed fever this afternoon, Tmax 101.4.  Will re-check covid and chest xray to start infectious workup.     Subjective 11/4: Patient seen, awake sitting up in bed, wife at bedside this morning.  Patient not interactive for me, not answering questions regarding pain or symptoms.  Per wife, this is not his baseline.  She expresses worry that since he agreed to SNF yesterday, he may be more withdrawn now that he knows he is not going straight home.    Assessment & Plan:   Active Problems:   Essential hypertension   Erectile dysfunction   HLD (hyperlipidemia)   Depression   GERD (gastroesophageal reflux disease)   Closed right hip fracture (HCC)   Fever: etiology TBD.   - covid-19 test pending (also need for SNF) - chest xray ordered - Tylenol PRN Addendum: persistent fevers this evening, Tmax 102.2 per RN.  Added blood cultures and UA.  Had started Augmentin earlier today, but will switch to Unasyn  Right hip fracture, closed status post fall -Status post ORIF 02/18/2019 -PT/OT  -Pain control, bowel regimen -Lovenox for postop DVT prophylaxis. - agrees to SNF, accepted [facility]  Dysphagia -Seen by speech therapist, mild aspiration risk.  Recommend dysphagia 3 diet.  History of Parkinson's disease -Continue Sinemet  History of anxiety/depression -Continue Lexapro and Klonopin  GERD -PPI   DVT prophylaxis: Lovenox   Code Status: Full Code  Family Communication: none at bedside Disposition Plan:  To SNF rehab, pending eval of fever today   Consultants:   ortho  Procedures:   ORIF 02/18/19   Antimicrobials:   Perioperative only   Objective: Vitals:   02/19/19 1136 02/19/19 1540 02/19/19 2307 02/20/19 0818  BP: 130/63 129/72 138/72 (!) 150/68  Pulse: (!) 57 (!) 58 (!) 58 71  Resp:   19 17  Temp: 98.6 F (37 C) 97.7 F (36.5 C) 98.4 F (36.9 C) (!) 100.7 F (38.2 C)  TempSrc: Oral   Oral  SpO2: 93% 96%  93%  Weight:      Height:        Intake/Output Summary (Last 24 hours) at 02/20/2019 1442 Last data filed at 02/19/2019 1502 Gross per 24 hour  Intake 456.83 ml  Output -  Net 456.83 ml   Filed Weights   02/16/19 1630 02/17/19 1959 02/18/19 1344  Weight: 83.9 kg 86.8 kg 86 kg    Examination:  General exam: awake, alert, no acute distress Respiratory system: clear to auscultation bilaterally, no wheezes, rales or rhonchi, normal respiratory effort. Cardiovascular system: normal S1/S2, RRR, no JVD, murmurs, rubs, gallops, no pedal edema.   Gastrointestinal system: soft, non-tender, non-distended abdomen, normal bowel sounds. Central nervous system: no gross focal neurologic deficits, CN II-XII grossly intact Extremities: moves all, no edema, normal tone Skin: dry, intact, normal temperature Psychiatry: normal mood, congruent affect, judgement and insight appear normal    Data Reviewed: I have personally reviewed following labs and imaging studies  CBC: Recent Labs  Lab 02/16/19 1640 02/17/19 0655 02/18/19 0333 02/19/19 0518 02/20/19 0544  WBC 8.5 10.9* 8.8 9.4 10.2  NEUTROABS 6.2  --   --   --   --  HGB 14.6 14.1 13.1 11.8* 11.8*  HCT 42.8 40.4 39.0 34.5* 33.9*  MCV 89.4 88.0 89.4 88.0 88.3  PLT 182 164 150 141* [id number]   Basic Metabolic Panel: Recent Labs  Lab 02/16/19 1640 02/17/19 0655 02/18/19 0333 02/19/19 0518 02/20/19 0544  NA 140 140 139 136 136  K 4.0 3.4* 3.9 3.3* 4.0  CL 105 107 105 104 105  CO2 26 26 26 24  21*  GLUCOSE 158* 124* 134* 153* 120*  BUN 25* 22 21 19 19   CREATININE 0.92 0.98 0.87 0.88 0.77  CALCIUM 8.8* 8.6*  8.4* 8.1* 7.9*  MG  --   --  2.1 1.7 2.0   GFR: Estimated Creatinine Clearance: 94.7 mL/min (by C-G formula based on SCr of 0.77 mg/dL). Liver Function Tests: No results for input(s): AST, ALT, ALKPHOS, BILITOT, PROT, ALBUMIN in the last 168 hours. No results for input(s): LIPASE, AMYLASE in the last 168 hours. No results for input(s): AMMONIA in the last 168 hours. Coagulation Profile: Recent Labs  Lab 02/16/19 1640  INR 1.1   Cardiac Enzymes: No results for input(s): CKTOTAL, CKMB, CKMBINDEX, TROPONINI in the last 168 hours. BNP (last 3 results) No results for input(s): PROBNP in the last 8760 hours. HbA1C: No results for input(s): HGBA1C in the last 72 hours. CBG: No results for input(s): GLUCAP in the last 168 hours. Lipid Profile: No results for input(s): CHOL, HDL, LDLCALC, TRIG, CHOLHDL, LDLDIRECT in the last 72 hours. Thyroid Function Tests: No results for input(s): TSH, T4TOTAL, FREET4, T3FREE, THYROIDAB in the last 72 hours. Anemia Panel: No results for input(s): VITAMINB12, FOLATE, FERRITIN, TIBC, IRON, RETICCTPCT in the last 72 hours. Sepsis Labs: No results for input(s): PROCALCITON, LATICACIDVEN in the last 168 hours.  Recent Results (from the past 240 hour(s))  SARS Coronavirus 2 by RT PCR (hospital order, performed in [facility] hospital lab) Nasopharyngeal Nasopharyngeal Swab     Status: None   Collection Time: 02/16/19  4:40 PM   Specimen: Nasopharyngeal Swab  Result Value Ref Range Status   SARS Coronavirus 2 NEGATIVE NEGATIVE Final    Comment: (NOTE) If result is NEGATIVE SARS-CoV-2 target nucleic acids are NOT DETECTED. The SARS-CoV-2 RNA is generally detectable in upper and lower  respiratory specimens during the acute phase of infection. The lowest  concentration of SARS-CoV-2 viral copies this assay can detect is 250  copies / mL. A negative result does not preclude SARS-CoV-2 infection  and should not be used as the sole basis for treatment or  other  patient management decisions.  A negative result may occur with  improper specimen collection / handling, submission of specimen other  than nasopharyngeal swab, presence of viral mutation(s) within the  areas targeted by this assay, and inadequate number of viral copies  (<250 copies / mL). A negative result must be combined with clinical  observations, patient history, and epidemiological information. If result is POSITIVE SARS-CoV-2 target nucleic acids are DETECTED. The SARS-CoV-2 RNA is generally detectable in upper and lower  respiratory specimens dur ing the acute phase of infection.  Positive  results are indicative of active infection with SARS-CoV-2.  Clinical  correlation with patient history and other diagnostic information is  necessary to determine patient infection status.  Positive results do  not rule out bacterial infection or co-infection with other viruses. If result is PRESUMPTIVE POSTIVE SARS-CoV-2 nucleic acids MAY BE PRESENT.   A presumptive positive result was obtained on the submitted specimen  and confirmed on repeat testing.  While 2019 novel coronavirus  (SARS-CoV-2) nucleic acids may be present in the submitted sample  additional confirmatory testing may be necessary for epidemiological  and / or clinical management purposes  to differentiate between  SARS-CoV-2 and other Sarbecovirus currently known to infect humans.  If clinically indicated additional testing with an alternate test  methodology [phone]) is advised. The SARS-CoV-2 RNA is generally  detectable in upper and lower respiratory sp ecimens during the acute  phase of infection. The expected result is Negative. Fact Sheet for Patients:  StrictlyIdeas.no Fact Sheet for Healthcare Providers: BankingDealers.co.za This test is not yet approved or cleared by the [place] FDA and has been authorized for detection and/or diagnosis of  SARS-CoV-2 by FDA under an Emergency Use Authorization (EUA).  This EUA will remain in effect (meaning this test can be used) for the duration of the COVID-19 declaration under Section 564(b)(1) of the Act, 21 U.S.C. section 360bbb-3(b)(1), unless the authorization is terminated or revoked sooner. Performed at [facility], [address], [place], [place] [address]   Surgical pcr screen     Status: None   Collection Time: 02/17/19  8:27 PM   Specimen: Nasal Mucosa; Nasal Swab  Result Value Ref Range Status   MRSA, PCR NEGATIVE NEGATIVE Final   Staphylococcus aureus NEGATIVE NEGATIVE Final    Comment: (NOTE) The Xpert SA Assay (FDA approved for NASAL specimens in patients 14 years of age and older), is one component of a comprehensive surveillance program. It is not intended to diagnose infection nor to guide or monitor treatment. Performed at [facility], [address], [place], [place] [address]          Radiology Studies: Dg Hip Port Unilat With Pelvis 1v Right  Result Date: 02/18/2019 CLINICAL DATA:  Postop intramedullary nailing of intertrochanteric fracture. EXAM: DG HIP (WITH OR WITHOUT PELVIS) 1V PORT RIGHT COMPARISON:  Intraoperative views of 02/18/2019 preoperative assessment of 02/16/2019 FINDINGS: AP and cross table lateral views show intramedullary nailing and femoral neck screw in similar position, no signs of hardware complication. Lucency over the right hip and skin staples compatible with recent surgical change. Femoral head remains located. Signs of lumbar spinal fusion are partially imaged. IMPRESSION: Post ORIF of right intertrochanteric fracture without complicating features. Electronically Signed   By: Zetta Bills M.D.   On: 02/18/2019 18:05   Dg Hip Operative Unilat W Or W/o Pelvis Right  Result Date: 02/18/2019 CLINICAL DATA:  Right hip item nail placement EXAM: OPERATIVE RIGHT HIP (WITH PELVIS IF PERFORMED) 4 VIEWS TECHNIQUE:  Fluoroscopic spot image(s) were submitted for interpretation post-operatively. COMPARISON:  None. FLUOROSCOPY TIME:  Fluoroscopy Time:  42 seconds FINDINGS: Multiple intraoperative fluoroscopic spot images are provided without a radiologist present. Interval placement of a right proximal femoral intramedullary nail and interlocking femoral neck screw. IMPRESSION: Intraoperative localization Electronically Signed   By: Kathreen Devoid   On: 02/18/2019 16:55        Scheduled Meds: . carbidopa-levodopa  1 tablet Oral BID AC  . carbidopa-levodopa  2 tablet Oral Q breakfast  . cholecalciferol  1,000 Units Oral Daily  . docusate sodium  100 mg Oral BID  . enoxaparin (LOVENOX) injection  40 mg Subcutaneous Q24H  . escitalopram  10 mg Oral Daily  . Melatonin  10 mg Oral QHS  . memantine  10 mg Oral BID  . pantoprazole  40 mg Oral Daily  . traMADol  50 mg Oral Q6H   Continuous Infusions: . methocarbamol (ROBAXIN)  IV       LOS: 4 days    Time spent: 30-35 min    Ezekiel Slocumb, DO [facility] Pager: [phone]  If 7PM-7AM, please contact night-coverage [email or web address] Password [email or web address] 02/20/2019, 2:42 PM

## 2019-02-20 NOTE — Progress Notes (Signed)
MEWS score of 2. Pt temp of 100.7. LOC responds to voice. Pt started Unasyn tonight for pneumonia.MD aware. Pdowless, rn

## 2019-02-20 NOTE — TOC Progression Note (Signed)
Transition of Care [facility]) - Progression Note    Patient Details  Name: DONOLD SHEFFLER MRN: [id number] Date of Birth: [birth year]  [facility]) CM/SW Contact  Su Hilt, RN Phone Number: 02/20/2019, 9:33 AM  Clinical Narrative:     Spoke with the patient's wife [name] via telephone and reviewed the Bed offers, the patient and his spouse has chosen [facility] I explained I will request auth from [place] called [facility] and spoke to Perrin to request authorization.  I faxed clinical information to [facility] at [phone] Expected Discharge Plan: [place] Barriers to Discharge: Insurance Authorization, Continued Medical Work up, SNF Pending bed offer  Expected Discharge Plan and Services Expected Discharge Plan: [place]   Discharge Planning Services: CM Consult   Living arrangements for the past 2 months: Single Family Home                                       Social Determinants of Health (SDOH) Interventions    Readmission Risk Interventions No flowsheet data found.

## 2019-02-20 NOTE — Progress Notes (Signed)
PT Cancellation Note  Patient Details Name: [name] MRN: [id number] DOB: [birth year]   Cancelled Treatment:    Reason Eval/Treat Not Completed: Fatigue/lethargy limiting ability to participate.  Upon arrival to pt's room, pt's wife present and reports difficulty waking pt today.  Pt appearing sound asleep in bed.  Nurse took pt's temperature which was 101.4.  Per discussion with pt's nurse, will hold PT at this time (d/t elevated temperature and impaired level of arousal).  Will re-attempt PT treatment session at a later date/time.  Leitha Bleak, PT 02/20/19, 2:32 PM [phone]

## 2019-02-20 NOTE — Progress Notes (Signed)
Subjective:  POD #2 s/p medullary fixation for right intertrochanteric hip fracture.  Patient is drowsy but arousable today..   Patient reports right hip pain as mild to moderate.    Objective:   VITALS:   Vitals:   02/19/19 1136 02/19/19 1540 02/19/19 2307 02/20/19 0818  BP: 130/63 129/72 138/72 (!) 150/68  Pulse: (!) 57 (!) 58 (!) 58 71  Resp:   19 17  Temp: 98.6 F (37 C) 97.7 F (36.5 C) 98.4 F (36.9 C) (!) 100.7 F (38.2 C)  TempSrc: Oral   Oral  SpO2: 93% 96%  93%  Weight:      Height:        PHYSICAL EXAM: Right lower extremity:   Neurovascular intact Sensation intact distally Intact pulses distally Dorsiflexion/Plantar flexion intact Honeycomb dressing with moderate dried serosanguineous drainage No cellulitis present Compartment soft  LABS  Results for orders placed or performed during the hospital encounter of 02/16/19 (from the past 24 hour(s))  CBC     Status: Abnormal   Collection Time: 02/20/19  5:44 AM  Result Value Ref Range   WBC 10.2 4.0 - 10.5 K/uL   RBC 3.84 (L) 4.22 - 5.81 MIL/uL   Hemoglobin 11.8 (L) 13.0 - 17.0 g/dL   HCT 33.9 (L) 39.0 - 52.0 %   MCV 88.3 80.0 - 100.0 fL   MCH 30.7 26.0 - 34.0 pg   MCHC 34.8 30.0 - 36.0 g/dL   RDW 12.8 11.5 - 15.5 %   Platelets 154 150 - 400 K/uL   nRBC 0.0 0.0 - 0.2 %  Basic metabolic panel     Status: Abnormal   Collection Time: 02/20/19  5:44 AM  Result Value Ref Range   Sodium 136 135 - 145 mmol/L   Potassium 4.0 3.5 - 5.1 mmol/L   Chloride 105 98 - 111 mmol/L   CO2 21 (L) 22 - 32 mmol/L   Glucose, Bld 120 (H) 70 - 99 mg/dL   BUN 19 8 - 23 mg/dL   Creatinine, Ser 0.77 0.61 - 1.24 mg/dL   Calcium 7.9 (L) 8.9 - 10.3 mg/dL   GFR calc non Af Amer >60 >60 mL/min   GFR calc Af Amer >60 >60 mL/min   Anion gap 10 5 - 15  Magnesium     Status: None   Collection Time: 02/20/19  5:44 AM  Result Value Ref Range   Magnesium 2.0 1.7 - 2.4 mg/dL    Dg Hip Port Unilat With Pelvis 1v Right  Result  Date: 02/18/2019 CLINICAL DATA:  Postop intramedullary nailing of intertrochanteric fracture. EXAM: DG HIP (WITH OR WITHOUT PELVIS) 1V PORT RIGHT COMPARISON:  Intraoperative views of 02/18/2019 preoperative assessment of 02/16/2019 FINDINGS: AP and cross table lateral views show intramedullary nailing and femoral neck screw in similar position, no signs of hardware complication. Lucency over the right hip and skin staples compatible with recent surgical change. Femoral head remains located. Signs of lumbar spinal fusion are partially imaged. IMPRESSION: Post ORIF of right intertrochanteric fracture without complicating features. Electronically Signed   By: Zetta Bills M.D.   On: 02/18/2019 18:05   Dg Hip Operative Unilat W Or W/o Pelvis Right  Result Date: 02/18/2019 CLINICAL DATA:  Right hip item nail placement EXAM: OPERATIVE RIGHT HIP (WITH PELVIS IF PERFORMED) 4 VIEWS TECHNIQUE: Fluoroscopic spot image(s) were submitted for interpretation post-operatively. COMPARISON:  None. FLUOROSCOPY TIME:  Fluoroscopy Time:  42 seconds FINDINGS: Multiple intraoperative fluoroscopic spot images are provided without a radiologist  present. Interval placement of a right proximal femoral intramedullary nail and interlocking femoral neck screw. IMPRESSION: Intraoperative localization Electronically Signed   By: Kathreen Devoid   On: 02/18/2019 16:55    Assessment/Plan: 2 Days Post-Op   Active Problems:   Essential hypertension   Erectile dysfunction   HLD (hyperlipidemia)   Depression   GERD (gastroesophageal reflux disease)   Closed right hip fracture ([place])  Patient appears comfortable postop.  Patient is more lethargic today which is likely from pain medication.  Patient is weightbearing as tolerated on right lower extremity.  Continue with physical therapy.  Patient will need skilled nursing facility upon discharge.  Patient will continue Lovenox for DVT prophylaxis.    Thornton Park , MD 02/20/2019,  12:49 PM

## 2019-02-20 NOTE — Progress Notes (Signed)
Pt is urinating on his own. Earlier pt pulled off condom cath. Bladder was not scanned since pt is incontinent.pdowless,rn

## 2019-02-20 NOTE — Progress Notes (Signed)
Patient has been sleeping most of this shift.  Oral temp is 102.2. MD notified by Eustace Pen, RN. No new orders received. Patient has received Tylenol.

## 2019-02-20 NOTE — Consult Note (Signed)
Pharmacy Antibiotic Note  [name] is a 68 y.o. male admitted on 02/16/2019 with pneumonia.  Pharmacy has been consulted for Unasyn dosing.  Plan: Unasyn 3g q6H.   Height: 5\' 9"  (175.3 cm) Weight: 189 lb 9.5 oz (86 kg) IBW/kg (Calculated) : 70.7  Temp (24hrs), Avg:100.5 F (38.1 C), Min:98.4 F (36.9 C), Max:102.2 F (39 C)  Recent Labs  Lab 02/16/19 1640 02/17/19 0655 02/18/19 0333 02/19/19 0518 02/20/19 0544  WBC 8.5 10.9* 8.8 9.4 10.2  CREATININE 0.92 0.98 0.87 0.88 0.77    Estimated Creatinine Clearance: 94.7 mL/min (by C-G formula based on SCr of 0.77 mg/dL).    Allergies  Allergen Reactions  . Klonopin [Clonazepam] Other (See Comments)    Worsening mood/depression  . Morphine And Related Other (See Comments)    hallucinations  . Prednisone Other (See Comments)    Unclear reaction    Microbiology results: None  Thank you for allowing pharmacy to be a part of this patient's care.  Oswald Hillock, PharmD, BCPS 02/20/2019 7:35 PM

## 2019-02-20 NOTE — Progress Notes (Signed)
Dr. Arbutus Ped notified that patient's temp orally is 101.4.

## 2019-02-20 NOTE — Progress Notes (Signed)
MD addressed temp. See new orders for blood cultures and IV Unasyn.

## 2019-02-20 NOTE — TOC Progression Note (Signed)
Transition of Care [facility]) - Progression Note    Patient Details  Name: MILOS BEVANS MRN: [id number] Date of Birth: 1950/10/09  Transition of Care [facility]) CM/SW Contact  Su Hilt, RN Phone Number: 02/20/2019, 2:35 PM  Clinical Narrative:     Rosalie called and provided the insurance authorization Auth approval number [id number] good thru Friday next review date is 11/6 fax clinical to [place] at fax number [phone]  I called Isaias Cowman and provided the information  Expected Discharge Plan: [place] Barriers to Discharge: Ship broker, Continued Medical Work up, SNF Pending bed offer  Expected Discharge Plan and Services Expected Discharge Plan: [place]   Discharge Planning Services: CM Consult   Living arrangements for the past 2 months: Single Family Home                                       Social Determinants of Health (SDOH) Interventions    Readmission Risk Interventions No flowsheet data found.

## 2019-02-21 DIAGNOSIS — J690 Pneumonitis due to inhalation of food and vomit: Secondary | ICD-10-CM

## 2019-02-21 DIAGNOSIS — S72001D Fracture of unspecified part of neck of right femur, subsequent encounter for closed fracture with routine healing: Secondary | ICD-10-CM

## 2019-02-21 LAB — BASIC METABOLIC PANEL
Anion gap: 9 (ref 5–15)
BUN: 21 mg/dL (ref 8–23)
CO2: 24 mmol/L (ref 22–32)
Calcium: 8.3 mg/dL — ABNORMAL LOW (ref 8.9–10.3)
Chloride: 104 mmol/L (ref 98–111)
Creatinine, Ser: 0.83 mg/dL (ref 0.61–1.24)
GFR calc Af Amer: >60 mL/min (ref >60)
GFR calc non Af Amer: >60 mL/min (ref >60)
Glucose, Bld: 130 mg/dL — ABNORMAL HIGH (ref 70–99)
Potassium: 3.8 mmol/L (ref 3.5–5.1)
Sodium: 137 mmol/L (ref 135–145)

## 2019-02-21 LAB — CBC
HCT: 36.6 % — ABNORMAL LOW (ref 39.0–52.0)
Hemoglobin: 12.3 g/dL — ABNORMAL LOW (ref 13.0–17.0)
MCH: 30.4 pg (ref 26.0–34.0)
MCHC: 33.6 g/dL (ref 30.0–36.0)
MCV: 90.6 fL (ref 80.0–100.0)
Platelets: 141 10*3/uL — ABNORMAL LOW (ref 150–400)
RBC: 4.04 MIL/uL — ABNORMAL LOW (ref 4.22–5.81)
RDW: 13.1 % (ref 11.5–15.5)
WBC: 14.5 10*3/uL — ABNORMAL HIGH (ref 4.0–10.5)
nRBC: 0 % (ref 0.0–0.2)

## 2019-02-21 LAB — MAGNESIUM: Magnesium: 2.4 mg/dL (ref 1.7–2.4)

## 2019-02-21 LAB — SARS CORONAVIRUS 2 (TAT 6-24 HRS): SARS Coronavirus 2: NEGATIVE

## 2019-02-21 MED ORDER — SENNA 8.6 MG PO TABS
1.0000 | ORAL_TABLET | Freq: Every day | ORAL | Status: AC
  Administered 2019-02-21: 8.6 mg via ORAL
  Filled 2019-02-21: qty 1

## 2019-02-21 NOTE — Progress Notes (Signed)
Physical Therapy Treatment Patient Details Name: [name] MRN: [id number] DOB: Aug 09, 1950 Today's Date: 02/21/2019    History of Present Illness Pt is a 68 y.o. male presenting to hospital 02/16/19 s/p fall onto R hip when walking without walker and possibly hit head.  Pt s/p IM fixation 11/2 secondary R intertrochanteric hip fx.  PMH includes Parkinson's, B DBS, bradycardia, PTSD, dysrhythmia, htn, sleep apnea, SOB, Dupuytren's contracture, h/o L-spine surgery.    PT Comments    Pt struggled with PT today and continues to be confused, though by all accounts he is much more clear today than yesterday.  Pt was able to show some inconsistent AROM effort with exercises but never did show consistent, quality motion with each task requested of him.  He showed some effort with getting to EOB and with multiple attempts to stand (with his U-walker and with FWW).  Each effort was very limited and even with very heavy assist he was unable to attain full upright.    Follow Up Recommendations  SNF     Equipment Recommendations  3in1 (PT);Wheelchair (measurements PT);Wheelchair cushion (measurements PT)    Recommendations for Other Services       Precautions / Restrictions Precautions Precautions: Fall Restrictions Weight Bearing Restrictions: Yes RLE Weight Bearing: Weight bearing as tolerated    Mobility  Bed Mobility Overal bed mobility: Needs Assistance Bed Mobility: Supine to Sit;Sit to Supine     Supine to sit: Max assist Sit to supine: Max assist   General bed mobility comments: Heavy assist to get to sitting, struggled to maintain upright sitting posture, leaning to the L despite phyiscal assist to square up to EOB and use UEs to support trunk  Transfers Overall transfer level: Needs assistance Equipment used: Rolling walker (2 wheeled)(U-step walker - multiple attempts with each AD) Transfers: Sit to/from Stand Sit to Stand: Total assist         General transfer  comment: Pt was unable to attain standing on multiple attempts despite heavy cuing and assist.  Unable to get weight forward/over walker.  This PT has not previously worked with this pt but it appears that he was more limited with balance, standing, strength, etc than on eval 2 days ago (still very limited at that time too)  Ambulation/Gait             General Gait Details: unable this date   Stairs             Wheelchair Mobility    Modified Rankin (Stroke Patients Only)       Balance   Sitting-balance support: Bilateral upper extremity supported;Feet supported Sitting balance-Leahy Scale: Poor   Postural control: Right lateral lean(pt leaning to the L last PT session)   Standing balance-Leahy Scale: Zero                              Cognition Arousal/Alertness: Awake/alert Behavior During Therapy: Flat affect Overall Cognitive Status: (Per wife: improved from yesterday, but still more confused t)                                 General Comments: Pt oriented to self only. When asked place he states "RTP". Requires increased time for processing information/questions and easily distracted by stimuli in room (e.g. looking out windows or at television). Requires consistent multimodal prompting.      Exercises  General Exercises - Lower Extremity Ankle Circles/Pumps: AROM;10 reps Short Arc Quad: AROM;AAROM;10 reps Heel Slides: AAROM;10 reps Hip ABduction/ADduction: AROM;AAROM;10 reps    General Comments        Pertinent Vitals/Pain Pain Assessment: Faces Faces Pain Scale: Hurts little more Pain Location: R hip/thigh with movement    Home Living                      Prior Function            PT Goals (current goals can now be found in the care plan section) Acute Rehab PT Goals PT Goal Formulation: With patient/family Time For Goal Achievement: 03/05/19 Potential to Achieve Goals: Good Progress towards PT goals:  (mental status, pain are significant limiters)    Frequency    BID      PT Plan Current plan remains appropriate    Co-evaluation              AM-PAC PT "6 Clicks" Mobility   Outcome Measure  Help needed turning from your back to your side while in a flat bed without using bedrails?: A Lot Help needed moving from lying on your back to sitting on the side of a flat bed without using bedrails?: Total Help needed moving to and from a bed to a chair (including a wheelchair)?: Total Help needed standing up from a chair using your arms (e.g., wheelchair or bedside chair)?: Total Help needed to walk in hospital room?: Total Help needed climbing 3-5 steps with a railing? : Total 6 Click Score: 7    End of Session Equipment Utilized During Treatment: Gait belt Activity Tolerance: Patient limited by fatigue(inability to fully follow instructions) Patient left: with bed alarm set;with call bell/phone within reach;with family/visitor present Nurse Communication: Mobility status PT Visit Diagnosis: Other abnormalities of gait and mobility (R26.89);Muscle weakness (generalized) (M62.81);Repeated falls (R29.6);History of falling (Z91.81);Difficulty in walking, not elsewhere classified (R26.2);Pain Pain - Right/Left: Right Pain - part of body: Hip     Time: 0925-1009 PT Time Calculation (min) (ACUTE ONLY): 44 min  Charges:  $Therapeutic Exercise: 8-22 mins $Therapeutic Activity: 23-37 mins                     Kreg Shropshire, DPT 02/21/2019, 1:07 PM

## 2019-02-21 NOTE — Care Management Important Message (Signed)
Important Message  Patient Details  Name: KYON FOOSHEE MRN: [id number] Date of Birth: 1950/12/25   Medicare Important Message Given:  Yes     Juliann Pulse A Gayleen Sholtz 02/21/2019, 11:48 AM

## 2019-02-21 NOTE — Progress Notes (Addendum)
PROGRESS NOTE    [name]  [id number] DOB: [birth year] DOA: 02/16/2019  PCP: Tonia Ghent, MD    LOS - 5   Brief Narrative: 68 year old with history of HTN, Parkinson's disease status post deep brain stimulator, bradycardia, PTSD, obstructive sleep apnea presented after mechanical fall with right hip fracture.Tolerated ORIF well on 11/2.  Patient developed fever on afternoon of 11/4, Tmax 102.2.  Covid re-tested and negative. CXR not conclusive, but possible infiltrate, started on Unasyn.  Blood cultures and UA pending.  Surgical site clean and without signs of infection.   Subjective 11/5: Patient seen, awake and sitting up in bed.  He is interacting today, answering questions.  Denies current pain, fever/chills, N/V/D, chest pain or SOB.  Also denies cough.  Wife reports he does occasionally get choke with swallowing due to Parkinson's.   Assessment & Plan:   Active Problems:   Essential hypertension   Erectile dysfunction   HLD (hyperlipidemia)   Depression   GERD (gastroesophageal reflux disease)   Closed right hip fracture (HCC)   Aspiration pneumonia (HCC)  Fever: etiology TBD, suspect PNA.   Concern for Aspiration Pneumonia - covid-19 negative (11/4) - chest xray with diffuse interstitial prominence which could be edema or atypical infection - follow up blood cultures and UA - continue Unasyn for presumed aspiration PNA - Tylenol PRN  Right hip fracture, closed status post fall -Status post ORIF 02/18/2019 -PT/OT -Pain control, bowel regimen -Lovenox for postop DVT prophylaxis. - agrees to SNF, accepted [facility]  Dysphagia -Seen by speech therapist, mild aspiration risk. Recommend dysphagia 3 diet.  History of Parkinson's disease -Continue Sinemet  History of anxiety/depression -Continue Lexapro and Klonopin  GERD -PPI   DVT prophylaxis: Lovenox   Code Status: Full Code  Family Communication: wife at bedside, updated today,  11/5 Disposition Plan:  To SNF rehab, tomorrow if he remains afebrile today and overnight.   Consultants:   ortho  Procedures:   ORIF 02/18/19  Antimicrobials:   Unasyn, start 11/4, 5-7 day course   Objective: Vitals:   02/20/19 1800 02/20/19 2036 02/20/19 2319 02/21/19 0815  BP:   129/60 (!) 151/69  Pulse:   65 61  Resp:   18 18  Temp: (!) 102.2 F (39 C) 98 F (36.7 C) (!) 100.7 F (38.2 C) 97.8 F (36.6 C)  TempSrc: Oral Oral Oral   SpO2:   94% 100%  Weight:      Height:        Intake/Output Summary (Last 24 hours) at 02/21/2019 1458 Last data filed at 02/21/2019 0400 Gross per 24 hour  Intake 320 ml  Output -  Net 320 ml   Filed Weights   02/16/19 1630 02/17/19 1959 02/18/19 1344  Weight: 83.9 kg 86.8 kg 86 kg    Examination:  General exam: awake, alert, no acute distress Respiratory system: clear to auscultation bilaterally but diminished at bases bilaterally, no wheezes, rales or rhonchi, normal respiratory effort. Cardiovascular system: normal S1/S2, RRR, no JVD, murmurs, rubs, gallops, no pedal edema.   Gastrointestinal system: soft, non-tender, non-distended abdomen, hypoactive bowel sounds. Central nervous system: alert and oriented x3. no gross focal neurologic deficits, normal speech Extremities: right lateral hip with dressing clean dry intact and no surrounding warmth, erythema or induration, no edema, normal tone Skin: dry, intact, normal temperature   Data Reviewed: I have personally reviewed following labs and imaging studies  CBC: Recent Labs  Lab 02/16/19 1640 02/17/19 0655 02/18/19 0333 02/19/19 0518  02/20/19 0544 02/21/19 0428  WBC 8.5 10.9* 8.8 9.4 10.2 14.5*  NEUTROABS 6.2  --   --   --   --   --   HGB 14.6 14.1 13.1 11.8* 11.8* 12.3*  HCT 42.8 40.4 39.0 34.5* 33.9* 36.6*  MCV 89.4 88.0 89.4 88.0 88.3 90.6  PLT 182 164 150 141* 154 [id number]*   Basic Metabolic Panel: Recent Labs  Lab 02/17/19 0655 02/18/19 0333 02/19/19  0518 02/20/19 0544 02/21/19 0428  NA 140 139 136 136 137  K 3.4* 3.9 3.3* 4.0 3.8  CL 107 105 104 105 104  CO2 26 26 24  21* 24  GLUCOSE 124* 134* 153* 120* 130*  BUN 22 21 19 19 21   CREATININE 0.98 0.87 0.88 0.77 0.83  CALCIUM 8.6* 8.4* 8.1* 7.9* 8.3*  MG  --  2.1 1.7 2.0 2.4   GFR: Estimated Creatinine Clearance: 91.2 mL/min (by C-G formula based on SCr of 0.83 mg/dL). Liver Function Tests: No results for input(s): AST, ALT, ALKPHOS, BILITOT, PROT, ALBUMIN in the last 168 hours. No results for input(s): LIPASE, AMYLASE in the last 168 hours. No results for input(s): AMMONIA in the last 168 hours. Coagulation Profile: Recent Labs  Lab 02/16/19 1640  INR 1.1   Cardiac Enzymes: No results for input(s): CKTOTAL, CKMB, CKMBINDEX, TROPONINI in the last 168 hours. BNP (last 3 results) No results for input(s): PROBNP in the last 8760 hours. HbA1C: No results for input(s): HGBA1C in the last 72 hours. CBG: No results for input(s): GLUCAP in the last 168 hours. Lipid Profile: No results for input(s): CHOL, HDL, LDLCALC, TRIG, CHOLHDL, LDLDIRECT in the last 72 hours. Thyroid Function Tests: No results for input(s): TSH, T4TOTAL, FREET4, T3FREE, THYROIDAB in the last 72 hours. Anemia Panel: No results for input(s): VITAMINB12, FOLATE, FERRITIN, TIBC, IRON, RETICCTPCT in the last 72 hours. Sepsis Labs: No results for input(s): PROCALCITON, LATICACIDVEN in the last 168 hours.  Recent Results (from the past 240 hour(s))  SARS Coronavirus 2 by RT PCR (hospital order, performed in [facility] hospital lab) Nasopharyngeal Nasopharyngeal Swab     Status: None   Collection Time: 02/16/19  4:40 PM   Specimen: Nasopharyngeal Swab  Result Value Ref Range Status   SARS Coronavirus 2 NEGATIVE NEGATIVE Final    Comment: (NOTE) If result is NEGATIVE SARS-CoV-2 target nucleic acids are NOT DETECTED. The SARS-CoV-2 RNA is generally detectable in upper and lower  respiratory specimens during  the acute phase of infection. The lowest  concentration of SARS-CoV-2 viral copies this assay can detect is 250  copies / mL. A negative result does not preclude SARS-CoV-2 infection  and should not be used as the sole basis for treatment or other  patient management decisions.  A negative result may occur with  improper specimen collection / handling, submission of specimen other  than nasopharyngeal swab, presence of viral mutation(s) within the  areas targeted by this assay, and inadequate number of viral copies  (<250 copies / mL). A negative result must be combined with clinical  observations, patient history, and epidemiological information. If result is POSITIVE SARS-CoV-2 target nucleic acids are DETECTED. The SARS-CoV-2 RNA is generally detectable in upper and lower  respiratory specimens dur ing the acute phase of infection.  Positive  results are indicative of active infection with SARS-CoV-2.  Clinical  correlation with patient history and other diagnostic information is  necessary to determine patient infection status.  Positive results do  not rule out bacterial infection or  co-infection with other viruses. If result is PRESUMPTIVE POSTIVE SARS-CoV-2 nucleic acids MAY BE PRESENT.   A presumptive positive result was obtained on the submitted specimen  and confirmed on repeat testing.  While 2019 novel coronavirus  (SARS-CoV-2) nucleic acids may be present in the submitted sample  additional confirmatory testing may be necessary for epidemiological  and / or clinical management purposes  to differentiate between  SARS-CoV-2 and other Sarbecovirus currently known to infect humans.  If clinically indicated additional testing with an alternate test  methodology [phone]) is advised. The SARS-CoV-2 RNA is generally  detectable in upper and lower respiratory sp ecimens during the acute  phase of infection. The expected result is Negative. Fact Sheet for Patients:   StrictlyIdeas.no Fact Sheet for Healthcare Providers: BankingDealers.co.za This test is not yet approved or cleared by the [place] FDA and has been authorized for detection and/or diagnosis of SARS-CoV-2 by FDA under an Emergency Use Authorization (EUA).  This EUA will remain in effect (meaning this test can be used) for the duration of the COVID-19 declaration under Section 564(b)(1) of the Act, 21 U.S.C. section 360bbb-3(b)(1), unless the authorization is terminated or revoked sooner. Performed at [facility], [address] [place], [place] [address]   Surgical pcr screen     Status: None   Collection Time: 02/17/19  8:27 PM   Specimen: Nasal Mucosa; Nasal Swab  Result Value Ref Range Status   MRSA, PCR NEGATIVE NEGATIVE Final   Staphylococcus aureus NEGATIVE NEGATIVE Final    Comment: (NOTE) The Xpert SA Assay (FDA approved for NASAL specimens in patients 76 years of age and older), is one component of a comprehensive surveillance program. It is not intended to diagnose infection nor to guide or monitor treatment. Performed at [facility], [place], [place] [address]   SARS CORONAVIRUS 2 (TAT 6-24 HRS) Nasopharyngeal Nasopharyngeal Swab     Status: None   Collection Time: 02/20/19  2:45 PM   Specimen: Nasopharyngeal Swab  Result Value Ref Range Status   SARS Coronavirus 2 NEGATIVE NEGATIVE Final    Comment: (NOTE) SARS-CoV-2 target nucleic acids are NOT DETECTED. The SARS-CoV-2 RNA is generally detectable in upper and lower respiratory specimens during the acute phase of infection. Negative results do not preclude SARS-CoV-2 infection, do not rule out co-infections with other pathogens, and should not be used as the sole basis for treatment or other patient management decisions. Negative results must be combined with clinical observations, patient history, and epidemiological  information. The expected result is Negative. Fact Sheet for Patients: SugarRoll.be Fact Sheet for Healthcare Providers: https://www.woods-mathews.com/ This test is not yet approved or cleared by the [place] FDA and  has been authorized for detection and/or diagnosis of SARS-CoV-2 by FDA under an Emergency Use Authorization (EUA). This EUA will remain  in effect (meaning this test can be used) for the duration of the COVID-19 declaration under Section 56 4(b)(1) of the Act, 21 U.S.C. section 360bbb-3(b)(1), unless the authorization is terminated or revoked sooner. Performed at [facility], [place] [address] [place], [place] [address]   CULTURE, BLOOD (ROUTINE X 2) w Reflex to ID Panel     Status: None (Preliminary result)   Collection Time: 02/20/19  7:40 PM   Specimen: BLOOD  Result Value Ref Range Status   Specimen Description   Final    BLOOD Blood Culture results may not be optimal due to an inadequate volume of blood received in culture  bottles   Special Requests BOTTLES DRAWN AEROBIC ONLY RIGHT ANTECUBITAL  Final   Culture   Final    NO GROWTH < 12 HOURS Performed at [facility], [address] [place], [place] [address]    Report Status PENDING  Incomplete  CULTURE, BLOOD (ROUTINE X 2) w Reflex to ID Panel     Status: None (Preliminary result)   Collection Time: 02/20/19  8:49 PM   Specimen: BLOOD  Result Value Ref Range Status   Specimen Description BLOOD LEFT HAND  Final   Special Requests   Final    BOTTLES DRAWN AEROBIC AND ANAEROBIC Blood Culture adequate volume   Culture   Final    NO GROWTH < 12 HOURS Performed at [facility], [address], [place],  [address]    Report Status PENDING  Incomplete         Radiology Studies: Dg Chest Port 1 View  Result Date: 02/20/2019 CLINICAL DATA:  68 year old male with fever. EXAM: PORTABLE CHEST 1 VIEW COMPARISON:  Chest radiograph  dated 02/16/2019 FINDINGS: There is shallow inspiration with bibasilar atelectasis. Diffuse interstitial prominence may represent mild congestion or edema. Atypical infection is not excluded. Clinical correlation is recommended. No focal consolidation, pleural effusion, or pneumothorax. Stable cardiomegaly. Stimulator device noted over the chest. No acute osseous pathology. IMPRESSION: 1. Shallow inspiration with bibasilar atelectasis. 2. Diffuse interstitial prominence may represent mild edema or atypical infection. 3. Stable cardiomegaly. Electronically Signed   By: Anner Crete M.D.   On: 02/20/2019 15:44        Scheduled Meds: . carbidopa-levodopa  1 tablet Oral BID AC  . carbidopa-levodopa  2 tablet Oral Q breakfast  . cholecalciferol  1,000 Units Oral Daily  . docusate sodium  100 mg Oral BID  . enoxaparin (LOVENOX) injection  40 mg Subcutaneous Q24H  . escitalopram  10 mg Oral Daily  . Melatonin  10 mg Oral QHS  . memantine  10 mg Oral BID  . pantoprazole  40 mg Oral Daily  . senna  1 tablet Oral Daily  . traMADol  50 mg Oral Q6H   Continuous Infusions: . ampicillin-sulbactam (UNASYN) IV 3 g (02/21/19 1212)  . methocarbamol (ROBAXIN) IV       LOS: 5 days    Time spent: 30-35 min    Ezekiel Slocumb, DO [facility] Pager: [phone]  If 7PM-7AM, please contact night-coverage [email or web address] Password [email or web address] 02/21/2019, 2:58 PM

## 2019-02-21 NOTE — Progress Notes (Signed)
Subjective:  POD #3 s/p intramedullary fixation for right intertrochanteric hip fracture..   Patient reports right hip pain as mild to moderate.  Patient is still lethargic today.  He answers questions but does not open his eyes.  His wife is at the bedside.  Patient had a fever yesterday and had a fever work-up.  Objective:   VITALS:   Vitals:   02/20/19 1800 02/20/19 2036 02/20/19 2319 02/21/19 0815  BP:   129/60 (!) 151/69  Pulse:   65 61  Resp:   18 18  Temp: (!) 102.2 F (39 C) 98 F (36.7 C) (!) 100.7 F (38.2 C) 97.8 F (36.6 C)  TempSrc: Oral Oral Oral   SpO2:   94% 100%  Weight:      Height:        PHYSICAL EXAM: Right lower extremity: I personally change the patient's dressing today.  There is no active drainage from his incisions.  There is no evidence of infection. Neurovascular intact Sensation intact distally Intact pulses distally Dorsiflexion/Plantar flexion intact Incision: no drainage No cellulitis present Compartment soft  LABS  Results for orders placed or performed during the hospital encounter of 02/16/19 (from the past 24 hour(s))  SARS CORONAVIRUS 2 (TAT 6-24 HRS) Nasopharyngeal Nasopharyngeal Swab     Status: None   Collection Time: 02/20/19  2:45 PM   Specimen: Nasopharyngeal Swab  Result Value Ref Range   SARS Coronavirus 2 NEGATIVE NEGATIVE  CULTURE, BLOOD (ROUTINE X 2) w Reflex to ID Panel     Status: None (Preliminary result)   Collection Time: 02/20/19  7:40 PM   Specimen: BLOOD  Result Value Ref Range   Specimen Description      BLOOD Blood Culture results may not be optimal due to an inadequate volume of blood received in culture bottles   Special Requests BOTTLES DRAWN AEROBIC ONLY RIGHT ANTECUBITAL    Culture      NO GROWTH < 12 HOURS Performed at [facility], [address] [place], [place] [address]    Report Status PENDING   CULTURE, BLOOD (ROUTINE X 2) w Reflex to ID Panel     Status: None (Preliminary result)    Collection Time: 02/20/19  8:49 PM   Specimen: BLOOD  Result Value Ref Range   Specimen Description BLOOD LEFT HAND    Special Requests      BOTTLES DRAWN AEROBIC AND ANAEROBIC Blood Culture adequate volume   Culture      NO GROWTH < 12 HOURS Performed at [facility], [address] [place], [place] [address]    Report Status PENDING   CBC     Status: Abnormal   Collection Time: 02/21/19  4:28 AM  Result Value Ref Range   WBC 14.5 (H) 4.0 - 10.5 K/uL   RBC 4.04 (L) 4.22 - 5.81 MIL/uL   Hemoglobin 12.3 (L) 13.0 - 17.0 g/dL   HCT 36.6 (L) 39.0 - 52.0 %   MCV 90.6 80.0 - 100.0 fL   MCH 30.4 26.0 - 34.0 pg   MCHC 33.6 30.0 - 36.0 g/dL   RDW 13.1 11.5 - 15.5 %   Platelets 141 (L) 150 - 400 K/uL   nRBC 0.0 0.0 - 0.2 %  Basic metabolic panel     Status: Abnormal   Collection Time: 02/21/19  4:28 AM  Result Value Ref Range   Sodium 137 135 - 145 mmol/L   Potassium 3.8 3.5 - 5.1 mmol/L   Chloride 104  98 - 111 mmol/L   CO2 24 22 - 32 mmol/L   Glucose, Bld 130 (H) 70 - 99 mg/dL   BUN 21 8 - 23 mg/dL   Creatinine, Ser 0.83 0.61 - 1.24 mg/dL   Calcium 8.3 (L) 8.9 - 10.3 mg/dL   GFR calc non Af Amer >60 >60 mL/min   GFR calc Af Amer >60 >60 mL/min   Anion gap 9 5 - 15  Magnesium     Status: None   Collection Time: 02/21/19  4:28 AM  Result Value Ref Range   Magnesium 2.4 1.7 - 2.4 mg/dL    Dg Chest Port 1 View  Result Date: 02/20/2019 CLINICAL DATA:  68 year old male with fever. EXAM: PORTABLE CHEST 1 VIEW COMPARISON:  Chest radiograph dated 02/16/2019 FINDINGS: There is shallow inspiration with bibasilar atelectasis. Diffuse interstitial prominence may represent mild congestion or edema. Atypical infection is not excluded. Clinical correlation is recommended. No focal consolidation, pleural effusion, or pneumothorax. Stable cardiomegaly. Stimulator device noted over the chest. No acute osseous pathology. IMPRESSION: 1. Shallow inspiration with bibasilar atelectasis. 2.  Diffuse interstitial prominence may represent mild edema or atypical infection. 3. Stable cardiomegaly. Electronically Signed   By: Anner Crete M.D.   On: 02/20/2019 15:44    Assessment/Plan: 3 Days Post-Op   Active Problems:   Essential hypertension   Erectile dysfunction   HLD (hyperlipidemia)   Depression   GERD (gastroesophageal reflux disease)   Closed right hip fracture [facility])  Patient doing well from orthopedic standpoint.  Patient had difficulty getting up today with physical therapy.  He will require skilled nursing facility upon discharge.  Patient should continue Lovenox 40 mg daily for DVT prophylaxis for at least 2 weeks postop.  Patient will follow-up in my office in 7-10 days after discharge.  He is weightbearing as tolerated on the right lower extremity.  Patient should continue to receive physical therapy for hip range of motion, lower extremity strengthening and gait training.    [place] , MD 02/21/2019, 1:20 PM

## 2019-02-21 NOTE — Progress Notes (Signed)
Physical Therapy Treatment Patient Details Name: [name] MRN: [id number] DOB: [birth year] Today's Date: 02/21/2019    History of Present Illness Pt is a 68 y.o. male presenting to hospital 02/16/19 s/p fall onto R hip when walking without walker and possibly hit head.  Pt s/p IM fixation 11/2 secondary R intertrochanteric hip fx.  PMH includes Parkinson's, B DBS, bradycardia, PTSD, dysrhythmia, htn, sleep apnea, SOB, Dupuytren's contracture, h/o L-spine surgery.    PT Comments    Pt more lethargic this afternoon, unable to do most exercises w/o at least some AAROM.  Pt did very poorly with sitting at EOB and despite much cuing/assist he struggled to even remain upright.  Pt leaning heavily to the L and at time closing eyes and seemingly dozing off in the middle of activity.  Unsafe to attempt standing this afternoon, pt continue to be very limited and far from his baseline.   Follow Up Recommendations  SNF     Equipment Recommendations  3in1 (PT);Wheelchair (measurements PT);Wheelchair cushion (measurements PT)    Recommendations for Other Services       Precautions / Restrictions Precautions Precautions: Fall Restrictions RLE Weight Bearing: Weight bearing as tolerated    Mobility  Bed Mobility Overal bed mobility: Needs Assistance Bed Mobility: Supine to Sit;Sit to Supine     Supine to sit: Max assist Sit to supine: Max assist   General bed mobility comments: Pt again unable to really assist with getting to EOB, showed some effort but essentially total assist.  Poor balance at EOB, leaning to the L this afternoon (more his normal) and nearly falling asleep.  Transfers Overall transfer level: Needs assistance               General transfer comment: Pt unable to keep himself awake or near upright at EOB.  Despite repeated cuing and pt indicating he was motivated to try we could not even maintain sitting w/o excessive assist long enough to even consider  standing  Ambulation/Gait                 Stairs             Wheelchair Mobility    Modified Rankin (Stroke Patients Only)       Balance   Sitting-balance support: Bilateral upper extremity supported;Feet supported Sitting balance-Leahy Scale: Zero Sitting balance - Comments: Despite assist to position his hips, hands and trunk appropriately he could not maintain sitting balance w/o heavy assist                                    Cognition Arousal/Alertness: Lethargic Behavior During Therapy: Flat affect Overall Cognitive Status: Impaired/Different from baseline                                 General Comments: pt falling asleep multiple times t/o session, consistent cuing to stay awake and engaged      Exercises General Exercises - Lower Extremity Ankle Circles/Pumps: 10 reps;AAROM Quad Sets: Strengthening;10 reps Short Arc Quad: AAROM;10 reps Heel Slides: AAROM;AROM;15 reps Hip ABduction/ADduction: AAROM;AROM;15 reps    General Comments        Pertinent Vitals/Pain Pain Assessment: Faces(pt indicating moderate pain w/ L hip movement but tolerates ) Faces Pain Scale: Hurts little more    Home Living  Prior Function            PT Goals (current goals can now be found in the care plan section)      Frequency    BID      PT Plan Current plan remains appropriate    Co-evaluation              AM-PAC PT "6 Clicks" Mobility   Outcome Measure  Help needed turning from your back to your side while in a flat bed without using bedrails?: Total Help needed moving from lying on your back to sitting on the side of a flat bed without using bedrails?: Total Help needed moving to and from a bed to a chair (including a wheelchair)?: Total Help needed standing up from a chair using your arms (e.g., wheelchair or bedside chair)?: Total Help needed to walk in hospital room?: Total Help  needed climbing 3-5 steps with a railing? : Total 6 Click Score: 6    End of Session Equipment Utilized During Treatment: Gait belt Activity Tolerance: Patient limited by fatigue Patient left: with bed alarm set;with call bell/phone within reach;with family/visitor present Nurse Communication: Mobility status PT Visit Diagnosis: Other abnormalities of gait and mobility (R26.89);Muscle weakness (generalized) (M62.81);Repeated falls (R29.6);History of falling (Z91.81);Difficulty in walking, not elsewhere classified (R26.2);Pain Pain - Right/Left: Right Pain - part of body: Hip     Time: [id number] PT Time Calculation (min) (ACUTE ONLY): 26 min  Charges:  $Therapeutic Exercise: 8-22 mins $Therapeutic Activity: 8-22 mins                     Kreg Shropshire, DPT 02/21/2019, 5:59 PM

## 2019-02-21 NOTE — Progress Notes (Signed)
Pt was not transferred to [facility]. Documentation on wrong pt. Pt remains in bed. Pt responds to staff voice but little response verbally. Pt moved q 2hrs. Encouraged to take deep breaths with little response. Manage fever with Tylenol and abx therapy. Will continue to monitor for safety and change in status. pdowless,rn

## 2019-02-21 NOTE — TOC Progression Note (Signed)
Transition of Care [facility]) - Progression Note    Patient Details  Name: [name] MRN: [id number] Date of Birth: [birth year]  [facility] CM/SW Contact  Su Hilt, RN Phone Number: 02/21/2019, 9:26 AM  Clinical Narrative:     [name] with [facility] notified me that they do have the bed for the patient for him to DC to.  Auth remains in place from [facility]  Expected Discharge Plan: [place] Barriers to Discharge: Ship broker, Continued Medical Work up, SNF Pending bed offer  Expected Discharge Plan and Services Expected Discharge Plan: [place]   Discharge Planning Services: CM Consult   Living arrangements for the past 2 months: Single Family Home                                       Social Determinants of Health (SDOH) Interventions    Readmission Risk Interventions No flowsheet data found.

## 2019-02-21 NOTE — Progress Notes (Addendum)
Speech Language Pathology Treatment: Dysphagia  Patient Details Name: [name] MRN: [id number] DOB: [birth year] Today's Date: 02/21/2019 Time: [id number] SLP Time Calculation (min) (ACUTE ONLY): 40 min  Assessment / Plan / Recommendation Clinical Impression  Pt seen today for ongoing assessment of toleration of recommended dysphagia diet; trials to upgrade if appropriate. Pt is more alert today; had fever and lethargy yesterday. He stated he felt "better"; he engaged w/ Wife and PT visiting the room then accepted a few po trials. Verbal output was reduced; he required min cues during tasks. Wife reports pt does have "some trouble swallowing at home because of the Parkinson's Disease" baseline. Pt consumed trials of Nectar consistency liquids via cup/straw adequately w/ no overt, clinical s/s of aspiration noted during/post intake: no decline in vocal quality or respiratory effort, no overt coughing. He consumed ~6 ozs total. Pt accepted 2-3 po trials of mech soft foods, then 3 trials of puree. Pt exhibited increased mastication time/effort w/ the increased textured foods - more of a munching/chomping effort vs rotary chewing. He orally cleared trials of puree in a more timely manner. Wife fed pt; he did not fully attempt to hold cup to drink from. Verbal cues were necessary w/ utensil presentation of po's.  Due to pt's declined Cognitive awareness and engagement currently at this time, recommend continue w/ a dysphagia diet but modify the diet consistency to all MINCED foods; nectar liquids. Recommend continue aspiration precautions; feeding support; Pills in Puree. Education had w/ Wife on general swallowing, Dysphagia in light of illness/Parkinson's Dis.; aspiration of liquids/po's and consequences of such; food consistencies and preparation for easier oral phase management; aspiration precautions. Will plan for objective swallow assessment tomorrow in preparation for pt's d/c to SNF for Rehab.  Recommend f/u w/ Dietician for drink supplement support. Wife agreed. NSG updated.     HPI HPI: [name] is a 68 y.o. male with a known history of essential hypertension, Parkinson's disease status post brain stimulator, bradycardia, PTSD, obstructive sleep apnea who presents to the hospital after mechanical fall noted to have a right hip fracture.  Patient was in his usual state of health and was walking in his backyard without his walker when he lost his footing and fell to the ground.  He did not lose consciousness, he had no head trauma he had no prodromal symptoms of chest pain, shortness of breath palpitations or any other associated symptoms.  He presented to the ER and underwent x-ray which showed right hip fracture.  Hospitalist services were contacted for admission.  Chest X-ray clear.  Surgery for hip fracture 02/18/2019.  Last MBS April/2017 with recommendation for a regular diet with thin liquids.  Per patient and wife- no problems with this diet until this hospital admission - suspect related to the acute trauma/illness.  CXR 02/21/2019: revealed "diffuse interstitial prominence may represent mild congestion or edema".       SLP Plan  Continue with current plan of care;MBS       Recommendations  Diet recommendations: Dysphagia 2 (fine chop);Nectar-thick liquid Liquids provided via: Cup;Straw Medication Administration: Crushed with puree(for safer swallowing) Supervision: Staff to assist with self feeding;Full supervision/cueing for compensatory strategies Compensations: Minimize environmental distractions;Slow rate;Small sips/bites;Lingual sweep for clearance of pocketing;Multiple dry swallows after each bite/sip;Follow solids with liquid Postural Changes and/or Swallow Maneuvers: Seated upright 90 degrees;Upright 30-60 min after meal                General recommendations: (Dietician f/u) Oral Care Recommendations: Oral  care BID;Staff/trained caregiver to provide oral  care Follow up Recommendations: (TBD) SLP Visit Diagnosis: Dysphagia, oropharyngeal phase (R13.12) Plan: Continue with current plan of care;MBS       GO                Orinda Kenner, MS, CCC-SLP Chanele Douglas 02/21/2019, 1:49 PM

## 2019-02-22 ENCOUNTER — Inpatient Hospital Stay: Payer: Medicare PPO

## 2019-02-22 DIAGNOSIS — I10 Essential (primary) hypertension: Secondary | ICD-10-CM

## 2019-02-22 DIAGNOSIS — W14XXXA Fall from tree, initial encounter: Secondary | ICD-10-CM

## 2019-02-22 DIAGNOSIS — J690 Pneumonitis due to inhalation of food and vomit: Secondary | ICD-10-CM

## 2019-02-22 DIAGNOSIS — Z885 Allergy status to narcotic agent status: Secondary | ICD-10-CM

## 2019-02-22 DIAGNOSIS — Z87891 Personal history of nicotine dependence: Secondary | ICD-10-CM

## 2019-02-22 DIAGNOSIS — Z79899 Other long term (current) drug therapy: Secondary | ICD-10-CM

## 2019-02-22 DIAGNOSIS — Z9682 Presence of neurostimulator: Secondary | ICD-10-CM

## 2019-02-22 DIAGNOSIS — R509 Fever, unspecified: Secondary | ICD-10-CM

## 2019-02-22 DIAGNOSIS — Z888 Allergy status to other drugs, medicaments and biological substances status: Secondary | ICD-10-CM

## 2019-02-22 DIAGNOSIS — R7881 Bacteremia: Secondary | ICD-10-CM

## 2019-02-22 DIAGNOSIS — G20 Parkinson's disease: Secondary | ICD-10-CM

## 2019-02-22 DIAGNOSIS — B9620 Unspecified Escherichia coli [E. coli] as the cause of diseases classified elsewhere: Secondary | ICD-10-CM

## 2019-02-22 DIAGNOSIS — S72141A Displaced intertrochanteric fracture of right femur, initial encounter for closed fracture: Principal | ICD-10-CM

## 2019-02-22 LAB — CBC WITH DIFFERENTIAL/PLATELET
Abs Immature Granulocytes: 0.05 10*3/uL (ref 0.00–0.07)
Basophils Absolute: 0 10*3/uL (ref 0.0–0.1)
Basophils Relative: 0 %
Eosinophils Absolute: 0.1 10*3/uL (ref 0.0–0.5)
Eosinophils Relative: 1 %
HCT: 31.4 % — ABNORMAL LOW (ref 39.0–52.0)
Hemoglobin: 10.5 g/dL — ABNORMAL LOW (ref 13.0–17.0)
Immature Granulocytes: 1 %
Lymphocytes Relative: 11 %
Lymphs Abs: 0.9 10*3/uL (ref 0.7–4.0)
MCH: 29.7 pg (ref 26.0–34.0)
MCHC: 33.4 g/dL (ref 30.0–36.0)
MCV: 89 fL (ref 80.0–100.0)
Monocytes Absolute: 0.7 10*3/uL (ref 0.1–1.0)
Monocytes Relative: 9 %
Neutro Abs: 6 10*3/uL (ref 1.7–7.7)
Neutrophils Relative %: 78 %
Platelets: 148 10*3/uL — ABNORMAL LOW (ref 150–400)
RBC: 3.53 MIL/uL — ABNORMAL LOW (ref 4.22–5.81)
RDW: 13.2 % (ref 11.5–15.5)
WBC: 7.6 10*3/uL (ref 4.0–10.5)
nRBC: 0 % (ref 0.0–0.2)

## 2019-02-22 LAB — BASIC METABOLIC PANEL
Anion gap: 8 (ref 5–15)
BUN: 24 mg/dL — ABNORMAL HIGH (ref 8–23)
CO2: 23 mmol/L (ref 22–32)
Calcium: 7.9 mg/dL — ABNORMAL LOW (ref 8.9–10.3)
Chloride: 104 mmol/L (ref 98–111)
Creatinine, Ser: 0.89 mg/dL (ref 0.61–1.24)
GFR calc Af Amer: >60 mL/min (ref >60)
GFR calc non Af Amer: >60 mL/min (ref >60)
Glucose, Bld: 113 mg/dL — ABNORMAL HIGH (ref 70–99)
Potassium: 3.6 mmol/L (ref 3.5–5.1)
Sodium: 135 mmol/L (ref 135–145)

## 2019-02-22 LAB — BLOOD CULTURE ID PANEL (REFLEXED)

## 2019-02-22 LAB — URINALYSIS, ROUTINE W REFLEX MICROSCOPIC
Bacteria, UA: NONE SEEN
Bilirubin Urine: NEGATIVE
Glucose, UA: NEGATIVE mg/dL
Hgb urine dipstick: NEGATIVE
Ketones, ur: NEGATIVE mg/dL
Leukocytes,Ua: NEGATIVE
Nitrite: NEGATIVE
Protein, ur: 30 mg/dL — AB
Specific Gravity, Urine: 1.023 (ref 1.005–1.030)
pH: 7 (ref 5.0–8.0)

## 2019-02-22 LAB — MAGNESIUM: Magnesium: 2.2 mg/dL (ref 1.7–2.4)

## 2019-02-22 MED ORDER — ENOXAPARIN SODIUM 40 MG/0.4ML ~~LOC~~ SOLN: 40.0000 mg | SUBCUTANEOUS | 0 refills | Status: DC

## 2019-02-22 MED ORDER — SODIUM CHLORIDE 0.9 % IV SOLN
3.0000 g | Freq: Four times a day (QID) | INTRAVENOUS | Status: DC
  Filled 2019-02-22 (×2): qty 8

## 2019-02-22 MED ORDER — BISACODYL 10 MG RE SUPP: 10.0000 mg | Freq: Every day | RECTAL | 0 refills | Status: DC | PRN

## 2019-02-22 MED ORDER — AMOXICILLIN-POT CLAVULANATE 875-125 MG PO TABS: 1.0000 | ORAL_TABLET | Freq: Two times a day (BID) | ORAL | 0 refills | Status: DC

## 2019-02-22 MED ORDER — AMOXICILLIN-POT CLAVULANATE 875-125 MG PO TABS
1.0000 | ORAL_TABLET | Freq: Two times a day (BID) | ORAL | Status: DC
  Administered 2019-02-22: 1 via ORAL
  Filled 2019-02-22: qty 1

## 2019-02-22 MED ORDER — DOCUSATE SODIUM 100 MG PO CAPS: 100.0000 mg | ORAL_CAPSULE | Freq: Two times a day (BID) | ORAL | 0 refills | Status: DC

## 2019-02-22 MED ORDER — SODIUM CHLORIDE 0.9 % IV SOLN
1.0000 g | Freq: Three times a day (TID) | INTRAVENOUS | Status: DC
  Administered 2019-02-22 – 2019-02-24 (×6): 1 g via INTRAVENOUS
  Filled 2019-02-22 (×10): qty 1

## 2019-02-22 NOTE — Progress Notes (Signed)
Pharmacy Antibiotic Note  [name] is a 68 y.o. male admitted on 02/16/2019 with bacteremia.  Pharmacy has been consulted for meropenem dosing.  Plan: Meropenem 1 g IV q8h   Height: 5\' 9"  (175.3 cm) Weight: 189 lb 9.5 oz (86 kg) IBW/kg (Calculated) : 70.7  Temp (24hrs), Avg:98.6 F (37 C), Min:97.4 F (36.3 C), Max:100.1 F (37.8 C)  Recent Labs  Lab 02/18/19 0333 02/19/19 0518 02/20/19 0544 02/21/19 0428 02/22/19 0518  WBC 8.8 9.4 10.2 14.5* 7.6  CREATININE 0.87 0.88 0.77 0.83 0.89    Estimated Creatinine Clearance: 85.1 mL/min (by C-G formula based on SCr of 0.89 mg/dL).    Allergies  Allergen Reactions  . Klonopin [Clonazepam] Other (See Comments)    Worsening mood/depression  . Morphine And Related Other (See Comments)    hallucinations  . Prednisone Other (See Comments)    Unclear reaction    Antimicrobials this admission: Unasyn 11/4 >>11/6 Augmentin 11/6 >>11/6 Meropenem 11/6 >>  Dose adjustments this admission:   Microbiology results: 11/4 BCx 1/4 bottles GNR - BCID with E coli  Thank you for allowing pharmacy to be a part of this patient's care.  Rocky Morel 02/22/2019 12:18 PM

## 2019-02-22 NOTE — Progress Notes (Signed)
Physical Therapy Treatment Patient Details Name: [name] MRN: [id number] DOB: [birth year] Today's Date: 02/22/2019    History of Present Illness Pt is a 68 y.o. male presenting to hospital 02/16/19 s/p fall onto R hip when walking without walker and possibly hit head.  Pt s/p IM fixation 11/2 secondary R intertrochanteric hip fx.  PMH includes Parkinson's, B DBS, bradycardia, PTSD, dysrhythmia, htn, sleep apnea, SOB, Dupuytren's contracture, h/o L-spine surgery.    PT Comments    Pt seen for PT/OT co-treatment.  Pt sleeping soundly in bed upon therapists arrival with SLP finishing with pt and SLP reporting pt recently falling asleep.  Sternal rub and significant vc's required to wake pt and pt able to stay alert with cueing from therapists during session.  Max assist x2 with bed mobility.  Initially pt max assist with sitting balance (d/t posterior lean mostly but mild R lean also noted) but improved balance was seen with vc's and tactile cues (progressed to CGA to min assist).  Although pt requiring assist with sitting balance, pt's balance (especially R lean) significantly improved compared to morning therapy session.  Pt performed ADL tasks with OT in sitting.  Max assist x2 for 1/3 stand up to U-step walker (x2 trials) with B knees blocked.  Will continue to focus on ROM, strengthening, and progressive functional mobility per pt tolerance.   Follow Up Recommendations  SNF     Equipment Recommendations  3in1 (PT);Wheelchair (measurements PT);Wheelchair cushion (measurements PT)    Recommendations for Other Services OT consult     Precautions / Restrictions Precautions Precautions: Fall Restrictions Weight Bearing Restrictions: Yes RLE Weight Bearing: Weight bearing as tolerated    Mobility  Bed Mobility Overal bed mobility: Needs Assistance Bed Mobility: Supine to Sit;Sit to Supine;Rolling Rolling: Max assist;+2 for physical assistance   Supine to sit: Max assist;+2 for  physical assistance Sit to supine: Max assist;+2 for physical assistance;Mod assist   General bed mobility comments: assist for trunk and B LE's supine to/from sit; 2 assist for logrolling L and R in bed for linen change; vc's/assist for hand placement and LE positioning to assist  Transfers Overall transfer level: Needs assistance Equipment used: (U-step) Transfers: Sit to/from Stand Sit to Stand: Max assist;+2 physical assistance         General transfer comment: x2 trials standing up to U-step walker; B knees blocked; assist for B hand placement; vc's for B foot placement; assist to initiate stand up to walker; able to clear pt's bottom from bed both trials (grossly 1/3rd stand each trial).  Ambulation/Gait             General Gait Details: unable this date   Stairs             Wheelchair Mobility    Modified Rankin (Stroke Patients Only)       Balance Overall balance assessment: Needs assistance Sitting-balance support: Bilateral upper extremity supported;Feet supported Sitting balance-Leahy Scale: Poor Sitting balance - Comments: pt initially max assist for sitting balance but with vc's and tactile cues improved to CGA to min assist with intermittent posterior or forward lean (mild R lean during session initially but improved with cueing) Postural control: Posterior lean;Right lateral lean Standing balance support: Bilateral upper extremity supported Standing balance-Leahy Scale: Zero Standing balance comment: pt with posterior lean with standing attempts with mild R lean  Cognition Arousal/Alertness: Lethargic Behavior During Therapy: Flat affect Overall Cognitive Status: Impaired/Different from baseline                                 General Comments: Pt was difficult to wake at beginning of session but opened eyes with sternal rub and vc's from OT.  During activity pt intermittently closing his eyes  (appeared to fall asleep 1x sitting EOB).  Pt occasionally making jokes with therapists during session.      Exercises     General Comments General comments (skin integrity, edema, etc.): When logrolling in bed for linen change, dark reddish purple marks noted on pt's buttocks (OT notified RN via secure message).  Nursing cleared pt for participation in physical therapy.  Pt agreeable to therapy session once woken and requested to try standing when asked.      Pertinent Vitals/Pain Pain Assessment: No/denies pain Faces Pain Scale: Hurts a little bit Pain Location: R hip/thigh at rest Pain Descriptors / Indicators: Sore Pain Intervention(s): Limited activity within patient's tolerance;Monitored during session;Repositioned    Home Living                      Prior Function            PT Goals (current goals can now be found in the care plan section) Acute Rehab PT Goals Patient Stated Goal: to improve mobility PT Goal Formulation: With patient/family Time For Goal Achievement: 03/05/19 Potential to Achieve Goals: Good Progress towards PT goals: Progressing toward goals    Frequency    BID      PT Plan Current plan remains appropriate    Co-evaluation PT/OT/SLP Co-Evaluation/Treatment: Yes Reason for Co-Treatment: Complexity of the patient's impairments (multi-system involvement);Necessary to address cognition/behavior during functional activity;For patient/therapist safety;To address functional/ADL transfers PT goals addressed during session: Mobility/safety with mobility;Balance;Proper use of DME;Strengthening/ROM OT goals addressed during session: ADL's and self-care;Strengthening/ROM      AM-PAC PT "6 Clicks" Mobility   Outcome Measure  Help needed turning from your back to your side while in a flat bed without using bedrails?: Total Help needed moving from lying on your back to sitting on the side of a flat bed without using bedrails?: Total Help  needed moving to and from a bed to a chair (including a wheelchair)?: Total Help needed standing up from a chair using your arms (e.g., wheelchair or bedside chair)?: Total Help needed to walk in hospital room?: Total Help needed climbing 3-5 steps with a railing? : Total 6 Click Score: 6    End of Session Equipment Utilized During Treatment: Gait belt Activity Tolerance: Patient limited by fatigue Patient left: in bed;with call bell/phone within reach;with bed alarm set;Other (comment);with SCD's reapplied(B heels floating via pillows) Nurse Communication: Mobility status;Precautions PT Visit Diagnosis: Other abnormalities of gait and mobility (R26.89);Muscle weakness (generalized) (M62.81);Repeated falls (R29.6);History of falling (Z91.81);Difficulty in walking, not elsewhere classified (R26.2);Pain Pain - Right/Left: Right Pain - part of body: Hip     Time: [id number] PT Time Calculation (min) (ACUTE ONLY): 40 min  Charges:  $Therapeutic Activity: 23-37 mins                     Leitha Bleak, PT 02/22/19, 4:46 PM [phone]

## 2019-02-22 NOTE — Consult Note (Signed)
NAME: [name]  DOB: [birth year]  MRN: [id number]  Date/Time: 02/22/2019 11:19 AM  REQUESTING PROVIDER: Dr.Griffith Subjective:  REASON FOR CONSULT: E.coli bacteremia ? [name] is a 68 y.o. male with a history of parkisons disease has b/l thalamic stimulators, HTN, bradycardia admitted from home after a fall. As per his wife he was outside in his yard when his foot got stuck and he tried to free it and fell. He came to ED on 02/16/19 and was found to have a rt femoral fracture. He was taken for ORIF opn 02/18/19 by Dr.Kraskinski. On 11/4 he had a fever and there was a concern for aspiration pneumonia and he was started on unasyn. Leucocytosis responded to antibiotics. They were planning to send him to SNF today when the blood culture came back positive for e.coli 1 of 4 bottle. I am asked to see the patient. Pt does not give any history He is lying in bed , somnolent with eyes closed and says yes when his name is called. His wife says he gets lethargic when he has a fever . Before his fall patient has been doing well , at his baseline with no fever, cough, sob, diarrhea or abdominal pain  Fever curve   Past Medical History:  Diagnosis Date   Arthritis    Bradycardia    Cancer ([place]) 2013   skin cancer   Depression    ptsd   Dysrhythmia    chronic slow heart rate   GERD (gastroesophageal reflux disease)    Headache(784.0)    tension headaches non recent   History of chicken pox    History of kidney stones    passed   Hypertension    treated with HCTZ   Parkinson's disease ([place])    dx'ed 15 years ago   PTSD (post-traumatic stress disorder)    Shortness of breath dyspnea    Sleep apnea    doesn't use C-pap   Varicose veins     Past Surgical History:  Procedure Laterality Date   CHOLECYSTECTOMY N/A 10/22/2014   Procedure: LAPAROSCOPIC CHOLECYSTECTOMY WITH INTRAOPERATIVE CHOLANGIOGRAM;  Surgeon: Dia Crawford III, MD;  Location: [facility]S;  Service: General;   Laterality: N/A;   cyst removed      from lip as a child   INTRAMEDULLARY (IM) NAIL INTERTROCHANTERIC N/A 02/18/2019   Procedure: INTRAMEDULLARY (IM) NAIL INTERTROCHANTRIC, RIGHT,;  Surgeon: Thornton Park, MD;  Location: [facility] OR[facility];  Service: Orthopedics;  Laterality: N/A;   LUMBAR LAMINECTOMY/DECOMPRESSION MICRODISCECTOMY Bilateral 12/14/2012   Procedure: Bilateral lumbar three-four, four-five decompressive laminotomy/foraminotomy;  Surgeon: Charlie Pitter, MD;  Location: [place] [facility];  Service: Neurosurgery;  Laterality: Bilateral;   PULSE GENERATOR IMPLANT Bilateral 12/13/2013   Procedure: Bilateral implantable pulse generator placement;  Surgeon: Erline Levine, MD;  Location: [facility];  Service: Neurosurgery;  Laterality: Bilateral;  Bilateral implantable pulse generator placement   skin cancer removed     from ears,   12 lft arm  rt leg 15   SUBTHALAMIC STIMULATOR BATTERY REPLACEMENT Bilateral 07/14/2017   Procedure: BILATERAL IMPLANTED PULSE GENERATOR CHANGE FOR DEEP BRAIN STIMULATOR;  Surgeon: Erline Levine, MD;  Location: [place];  Service: Neurosurgery;  Laterality: Bilateral;   SUBTHALAMIC STIMULATOR INSERTION Bilateral 12/06/2013   Procedure: SUBTHALAMIC STIMULATOR INSERTION;  Surgeon: Erline Levine, MD;  Location: [place];  Service: Neurosurgery;  Laterality: Bilateral;  Bilateral deep brain stimulator placement    Social History   Socioeconomic History   Marital status: Married  Spouse name: Not on file   Number of children: Not on file   Years of education: Not on file   Highest education level: Not on file  Occupational History   Occupation: disabled    Comment: 2001, PD    Comment: search and rescue helicopter  Social Needs   Financial resource strain: Not on file   Food insecurity    Worry: Not on file    Inability: Not on file   Transportation needs    Medical: Not on file    Non-medical: Not on file  Tobacco Use   Smoking status: Never Smoker     Smokeless tobacco: Former Systems developer  Substance and Sexual Activity   Alcohol use: Yes    Alcohol/week: 0.0 standard drinks    Comment: occasional (twice per month)   Drug use: No   Sexual activity: Not Currently  Lifestyle   Physical activity    Days per week: Not on file    Minutes per session: Not on file   Stress: Not on file  Relationships   Social connections    Talks on phone: Not on file    Gets together: Not on file    Attends religious service: Not on file    Active member of club or organization: Not on file    Attends meetings of clubs or organizations: Not on file    Relationship status: Not on file   Intimate partner violence    Fear of current or ex partner: Not on file    Emotionally abused: Not on file    Physically abused: Not on file    Forced sexual activity: Not on file  Other Topics Concern   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  Prev search and rescue work   3 kids from prev relationship.    Married 2014    Family History  Problem Relation Age of Onset   Lung cancer Father    Colon cancer Neg Hx    Prostate cancer Neg Hx    Allergies  Allergen Reactions   Klonopin [Clonazepam] Other (See Comments)    Worsening mood/depression   Morphine And Related Other (See Comments)    hallucinations   Prednisone Other (See Comments)    Unclear reaction    ? Current Facility-Administered Medications  Medication Dose Route Frequency Provider Last Rate Last Dose   acetaminophen (TYLENOL) suppository 325 mg  325 mg Rectal Q6H PRN Nicole Kindred A, DO       acetaminophen (TYLENOL) tablet 325-650 mg  325-650 mg Oral Q6H PRN Thornton Park, MD   650 mg at 02/21/19 0201   alum & mag hydroxide-simeth (MAALOX/MYLANTA) 200-200-20 MG/5ML suspension 30 mL  30 mL Oral Q4H PRN Thornton Park, MD       amoxicillin-clavulanate (AUGMENTIN) 875-125 MG per tablet 1 tablet  1 tablet Oral Q12H  Nicole Kindred A, DO   1 tablet at 02/22/19 [id number]   bisacodyl (DULCOLAX) suppository 10 mg  10 mg Rectal Daily PRN Thornton Park, MD       carbidopa-levodopa (SINEMET IR) 25-100 MG per tablet immediate release 1 tablet  1 tablet Oral BID AC Thornton Park, MD   1 tablet at 02/21/19 1806   carbidopa-levodopa (SINEMET IR) 25-100 MG per tablet immediate release 2 tablet  2 tablet Oral Q breakfast Thornton Park, MD   2 tablet at 02/22/19 [id number]   cholecalciferol (VITAMIN D3) tablet 1,000 Units  1,000 Units Oral Daily Thornton Park, MD   1,000 Units at 02/22/19 0908   docusate sodium (COLACE) capsule 100 mg  100 mg Oral BID Thornton Park, MD   100 mg at 02/22/19 0908   enoxaparin (LOVENOX) injection 40 mg  40 mg Subcutaneous Q24H Thornton Park, MD   40 mg at 02/22/19 0916   escitalopram (LEXAPRO) tablet 10 mg  10 mg Oral Daily Thornton Park, MD   10 mg at 02/22/19 [id number]   hydrALAZINE (APRESOLINE) injection 10 mg  10 mg Intravenous Q4H PRN Thornton Park, MD       HYDROmorphone (DILAUDID) injection 0.5-1 mg  0.5-1 mg Intravenous Q4H PRN Thornton Park, MD       ipratropium-albuterol (DUONEB) 0.5-2.5 (3) MG/3ML nebulizer solution 3 mL  3 mL Nebulization Q4H PRN Thornton Park, MD       magnesium citrate solution 1 Bottle  1 Bottle Oral Once PRN Thornton Park, MD       Melatonin TABS 10 mg  10 mg Oral QHS Thornton Park, MD   10 mg at 02/21/19 2211   memantine (NAMENDA) tablet 10 mg  10 mg Oral BID Thornton Park, MD   10 mg at 02/22/19 0908   menthol-cetylpyridinium (CEPACOL) lozenge 3 mg  1 lozenge Oral PRN Thornton Park, MD       Or   phenol (CHLORASEPTIC) mouth spray 1 spray  1 spray Mouth/Throat PRN Thornton Park, MD       methocarbamol (ROBAXIN) tablet 500 mg  500 mg Oral Q6H PRN Thornton Park, MD   500 mg at 02/22/19 [id number]   Or   methocarbamol (ROBAXIN) 500 mg in dextrose 5 % 50 mL IVPB  500 mg Intravenous Q6H PRN Thornton Park, MD        ondansetron [facility]) tablet 4 mg  4 mg Oral Q6H PRN Thornton Park, MD       Or   ondansetron [facility]) injection 4 mg  4 mg Intravenous Q6H PRN Thornton Park, MD       oxyCODONE (Oxy IR/ROXICODONE) immediate release tablet 5-10 mg  5-10 mg Oral Q4H PRN Thornton Park, MD   10 mg at 02/18/19 2035   pantoprazole (PROTONIX) EC tablet 40 mg  40 mg Oral Daily Thornton Park, MD   40 mg at 02/22/19 0907   polyethylene glycol (MIRALAX / GLYCOLAX) packet 17 g  17 g Oral Daily PRN Thornton Park, MD   17 g at 02/21/19 0941   traMADol (ULTRAM) tablet 50 mg  50 mg Oral Q6H Thornton Park, MD   50 mg at 02/21/19 1806     Abtx:  Anti-infectives (From admission, onward)   Start     Dose/Rate Route Frequency Ordered Stop   02/22/19 1000  amoxicillin-clavulanate (AUGMENTIN) 875-125 MG per tablet 1 tablet     1 tablet Oral Every 12 hours 02/22/19 0852 02/27/19 0959   02/22/19 0000  amoxicillin-clavulanate (AUGMENTIN) 875-125 MG tablet     1 tablet Oral Every 12 hours 02/22/19 0858 02/27/19 2359   02/20/19 2200  amoxicillin-clavulanate (AUGMENTIN) 875-125 MG per tablet 1 tablet  Status:  Discontinued     1 tablet Oral Every 12 hours 02/20/19 1648 02/20/19 1649   02/20/19 2000  Ampicillin-Sulbactam (UNASYN) 3 g in sodium chloride 0.9 % 100 mL IVPB  Status:  Discontinued     3 g 200 mL/hr over 30 Minutes Intravenous Every 6 hours 02/20/19 1936 02/22/19 0852   02/20/19 1700  amoxicillin-clavulanate (AUGMENTIN) 875-125 MG per tablet 1 tablet  Status:  Discontinued     1 tablet Oral Every 12 hours 02/20/19 1649 02/20/19 1929   02/18/19 2200  ceFAZolin (ANCEF) IVPB 1 g/50 mL premix     1 g 100 mL/hr over 30 Minutes Intravenous Every 6 hours 02/18/19 1843 02/19/19 0426      REVIEW OF SYSTEMS:  NA  Objective:  VITALS:  BP (!) 150/91 (BP Location: Left Arm)    Pulse (!) 108    Temp (!) 97.4 F (36.3 C) (Oral)    Resp 16    Ht 5\' 9"  (1.753 m)    Wt 86 kg    SpO2 94%    BMI 28.00 kg/m    PHYSICAL EXAM:  General: Somnolent, says yes when his name is called  Head: Normocephalic, without obvious abnormality, atraumatic. Eyes: Conjunctivae clear, anicteric sclerae. Pupils are equal ENT cannot examine Neck: Supple, can palpate the stimulator wire on both sides Back: did not examine Lungs: b/l air entry Heart: s1s2 B/l stimulator felt on the chest wall Abdomen: Soft, non-tender,not distended. Bowel sounds normal. No masses Condom catheter- urine in bag  Extremities: rt hip surgical site covered with perofrated dressing- looks fine atraumatic, no cyanosis. No edema. No clubbing Skin: No rashes or lesions. Or bruising Lymph: Cervical, supraclavicular normal. Neurologic: cannot examine Pertinent Labs Lab Results CBC    Component Value Date/Time   WBC 7.6 02/22/2019 0518   RBC 3.53 (L) 02/22/2019 0518   HGB 10.5 (L) 02/22/2019 0518   HGB 14.1 05/29/2013 0815   HCT 31.4 (L) 02/22/2019 0518   HCT 40.8 05/29/2013 0815   PLT 148 (L) 02/22/2019 0518   PLT 142 (L) 05/29/2013 0815   MCV 89.0 02/22/2019 0518   MCV 88 05/29/2013 0815   MCH 29.7 02/22/2019 0518   MCHC 33.4 02/22/2019 0518   RDW 13.2 02/22/2019 0518   RDW 14.4 05/29/2013 0815   LYMPHSABS 0.9 02/22/2019 0518   LYMPHSABS 1.1 05/29/2013 0815   MONOABS 0.7 02/22/2019 0518   MONOABS 0.6 05/29/2013 0815   EOSABS 0.1 02/22/2019 0518   EOSABS 0.1 05/29/2013 0815   BASOSABS 0.0 02/22/2019 0518   BASOSABS 0.0 05/29/2013 0815    CMP Latest Ref Rng & Units 02/22/2019 02/21/2019 02/20/2019  Glucose 70 - 99 mg/dL 113(H) 130(H) 120(H)  BUN 8 - 23 mg/dL 24(H) 21 19  Creatinine 0.61 - 1.24 mg/dL 0.89 0.83 0.77  Sodium 135 - 145 mmol/L 135 137 136  Potassium 3.5 - 5.1 mmol/L 3.6 3.8 4.0  Chloride 98 - 111 mmol/L 104 104 105  CO2 22 - 32 mmol/L 23 24 21(L)  Calcium 8.9 - 10.3 mg/dL 7.9(L) 8.3(L) 7.9(L)  Total Protein 6.0 - 8.3 g/dL - - -  Total Bilirubin 0.2 - 1.2 mg/dL - - -  Alkaline Phos 39 - 117 U/L - - -  AST  0 - 37 U/L - - -  ALT 0 - 53 U/L - - -      Microbiology: Recent Results (from the past 240 hour(s))  SARS Coronavirus 2 by RT PCR (hospital order, performed in [place] hospital lab) Nasopharyngeal Nasopharyngeal Swab     Status: None   Collection Time: 02/16/19  4:40 PM   Specimen: Nasopharyngeal Swab  Result Value Ref Range Status   SARS Coronavirus 2 NEGATIVE NEGATIVE Final    Comment: (NOTE) If result is NEGATIVE SARS-CoV-2 target nucleic acids are NOT DETECTED. The SARS-CoV-2 RNA is generally detectable in upper and lower  respiratory specimens during the acute  phase of infection. The lowest  concentration of SARS-CoV-2 viral copies this assay can detect is 250  copies / mL. A negative result does not preclude SARS-CoV-2 infection  and should not be used as the sole basis for treatment or other  patient management decisions.  A negative result may occur with  improper specimen collection / handling, submission of specimen other  than nasopharyngeal swab, presence of viral mutation(s) within the  areas targeted by this assay, and inadequate number of viral copies  (<250 copies / mL). A negative result must be combined with clinical  observations, patient history, and epidemiological information. If result is POSITIVE SARS-CoV-2 target nucleic acids are DETECTED. The SARS-CoV-2 RNA is generally detectable in upper and lower  respiratory specimens dur ing the acute phase of infection.  Positive  results are indicative of active infection with SARS-CoV-2.  Clinical  correlation with patient history and other diagnostic information is  necessary to determine patient infection status.  Positive results do  not rule out bacterial infection or co-infection with other viruses. If result is PRESUMPTIVE POSTIVE SARS-CoV-2 nucleic acids MAY BE PRESENT.   A presumptive positive result was obtained on the submitted specimen  and confirmed on repeat testing.  While 2019 novel  coronavirus  (SARS-CoV-2) nucleic acids may be present in the submitted sample  additional confirmatory testing may be necessary for epidemiological  and / or clinical management purposes  to differentiate between  SARS-CoV-2 and other Sarbecovirus currently known to infect humans.  If clinically indicated additional testing with an alternate test  methodology [phone]) is advised. The SARS-CoV-2 RNA is generally  detectable in upper and lower respiratory sp ecimens during the acute  phase of infection. The expected result is Negative. Fact Sheet for Patients:  StrictlyIdeas.no Fact Sheet for Healthcare Providers: BankingDealers.co.za This test is not yet approved or cleared by the [place] FDA and has been authorized for detection and/or diagnosis of SARS-CoV-2 by FDA under an Emergency Use Authorization (EUA).  This EUA will remain in effect (meaning this test can be used) for the duration of the COVID-19 declaration under Section 564(b)(1) of the Act, 21 U.S.C. section 360bbb-3(b)(1), unless the authorization is terminated or revoked sooner. Performed at [facility], [address], [place], [place] [address]   Surgical pcr screen     Status: None   Collection Time: 02/17/19  8:27 PM   Specimen: Nasal Mucosa; Nasal Swab  Result Value Ref Range Status   MRSA, PCR NEGATIVE NEGATIVE Final   Staphylococcus aureus NEGATIVE NEGATIVE Final    Comment: (NOTE) The Xpert SA Assay (FDA approved for NASAL specimens in patients 11 years of age and older), is one component of a comprehensive surveillance program. It is not intended to diagnose infection nor to guide or monitor treatment. Performed at [facility], [place], [place] [address]   SARS CORONAVIRUS 2 (TAT 6-24 HRS) Nasopharyngeal Nasopharyngeal Swab     Status: None   Collection Time: 02/20/19  2:45 PM   Specimen: Nasopharyngeal Swab    Result Value Ref Range Status   SARS Coronavirus 2 NEGATIVE NEGATIVE Final    Comment: (NOTE) SARS-CoV-2 target nucleic acids are NOT DETECTED. The SARS-CoV-2 RNA is generally detectable in upper and lower respiratory specimens during the acute phase of infection. Negative results do not preclude SARS-CoV-2 infection, do not rule out co-infections with other pathogens, and should not be used as the sole basis for treatment or other patient management decisions. Negative  results must be combined with clinical observations, patient history, and epidemiological information. The expected result is Negative. Fact Sheet for Patients: SugarRoll.be Fact Sheet for Healthcare Providers: https://www.woods-mathews.com/ This test is not yet approved or cleared by the [place] FDA and  has been authorized for detection and/or diagnosis of SARS-CoV-2 by FDA under an Emergency Use Authorization (EUA). This EUA will remain  in effect (meaning this test can be used) for the duration of the COVID-19 declaration under Section 56 4(b)(1) of the Act, 21 U.S.C. section 360bbb-3(b)(1), unless the authorization is terminated or revoked sooner. Performed at [facility], [place] [address], [place], [place] [address]   CULTURE, BLOOD (ROUTINE X 2) w Reflex to ID Panel     Status: None (Preliminary result)   Collection Time: 02/20/19  7:40 PM   Specimen: BLOOD  Result Value Ref Range Status   Specimen Description   Final    BLOOD Blood Culture results may not be optimal due to an inadequate volume of blood received in culture bottles   Special Requests BOTTLES DRAWN AEROBIC ONLY RIGHT ANTECUBITAL  Final   Culture   Final    NO GROWTH < 12 HOURS Performed at [facility], [address] [place], [place] [address]    Report Status PENDING  Incomplete  CULTURE, BLOOD (ROUTINE X 2) w Reflex to ID Panel     Status: None (Preliminary result)    Collection Time: 02/20/19  8:49 PM   Specimen: BLOOD  Result Value Ref Range Status   Specimen Description BLOOD LEFT HAND  Final   Special Requests   Final    BOTTLES DRAWN AEROBIC AND ANAEROBIC Blood Culture adequate volume   Culture  Setup Time   Final    Organism ID to follow GRAM NEGATIVE RODS ANAEROBIC BOTTLE ONLY CRITICAL RESULT CALLED TO, READ BACK BY AND VERIFIED WITH: [facility] DUNCAN AT [id number] 02/22/2019.PMF Performed at [facility], [address] [place], [place] [address]    Culture GRAM NEGATIVE RODS  Final   Report Status PENDING  Incomplete  Blood Culture ID Panel (Reflexed)     Status: Abnormal   Collection Time: 02/20/19  8:49 PM  Result Value Ref Range Status   Enterococcus species NOT DETECTED NOT DETECTED Final   Listeria monocytogenes NOT DETECTED NOT DETECTED Final   Staphylococcus species NOT DETECTED NOT DETECTED Final   Staphylococcus aureus (BCID) NOT DETECTED NOT DETECTED Final   Streptococcus species NOT DETECTED NOT DETECTED Final   Streptococcus agalactiae NOT DETECTED NOT DETECTED Final   Streptococcus pneumoniae NOT DETECTED NOT DETECTED Final   Streptococcus pyogenes NOT DETECTED NOT DETECTED Final   Acinetobacter baumannii NOT DETECTED NOT DETECTED Final   Enterobacteriaceae species DETECTED (A) NOT DETECTED Final    Comment: Enterobacteriaceae represent a large family of gram-negative bacteria, not a single organism. CRITICAL RESULT CALLED TO, READ BACK BY AND VERIFIED WITH: [facility] DUNCAN AT [id number] 02/22/2019.PMF    Enterobacter cloacae complex NOT DETECTED NOT DETECTED Final   Escherichia coli DETECTED (A) NOT DETECTED Final    Comment: CRITICAL RESULT CALLED TO, READ BACK BY AND VERIFIED WITH: [facility] DUNCAN AT [id number] 02/22/2019.PMF    Klebsiella oxytoca NOT DETECTED NOT DETECTED Final   Klebsiella pneumoniae NOT DETECTED NOT DETECTED Final   Proteus species NOT DETECTED NOT DETECTED Final   Serratia marcescens NOT DETECTED NOT DETECTED  Final   Carbapenem resistance NOT DETECTED NOT DETECTED Final   Haemophilus influenzae NOT DETECTED NOT DETECTED Final  Neisseria meningitidis NOT DETECTED NOT DETECTED Final   Pseudomonas aeruginosa NOT DETECTED NOT DETECTED Final   Candida albicans NOT DETECTED NOT DETECTED Final   Candida glabrata NOT DETECTED NOT DETECTED Final   Candida krusei NOT DETECTED NOT DETECTED Final   Candida parapsilosis NOT DETECTED NOT DETECTED Final   Candida tropicalis NOT DETECTED NOT DETECTED Final    Comment: Performed at [facility], [address] [place], [place] [address]    IMAGING RESULTS:   I have personally reviewed the films ? Impression/Recommendation ?68 yr male with h/o parkinson's disease on b/l thalamic stimulators  Fall with fracture rt intertrochanteric region - s/p ORIF on 02/18/19  Fever on 11/4- aspiration pneumonitis was questioned and started on unasyn after culture was sent . Leucocytosis resolved Fever curve was trending down and now has another fever today  E.coli bacteremia- r/o urinary retention as he had foley between 11/1-11/3- Has good amount of urine in the condom bag. Will change unasyn to meropenem- Will repeat blood culture Get post void bladder scan  Parkinsons disease on carbidopa/levodopa- also has b/l thalamic stimulators ___________________________________________________ Discussed the management with the wife and requesting provider Note:  This document was prepared using Dragon voice recognition software and may include unintentional dictation errors.

## 2019-02-22 NOTE — Progress Notes (Signed)
PROGRESS NOTE    [name]  [id number] DOB: 29-Nov-1950 DOA: 02/16/2019  PCP: Tonia Ghent, MD    LOS - 6   Brief Narrative:  68 year old with history of HTN, Parkinson's disease status post deep brain stimulator, bradycardia, PTSD, obstructive sleep apnea presented after mechanical fall with right hip fracture.Tolerated ORIF well on 11/2.  Patient developed fever on afternoon of 11/4, Tmax 102.2. Covid re-tested and negative. CXR not conclusive, but possible infiltrate, started on Unasyn.  Blood culture return with E.coli in 1 of 4 bottles.  ID consulted.  Suspect possible UTI, antibiotic changed to meropenem for empiric ESBL coverage pending culture sensitivities.  Surgical site clean and without signs of infection.   Subjective 11/6: Patient seen awake sitting up in bed, wife at bedside this morning.  He is a little less interactive today. He denies significant pain, trouble breathing, chest pain, N/V/D.  Has still not had a BM.  No acute events reported overnight.    Assessment & Plan:   Active Problems:   Essential hypertension   Erectile dysfunction   HLD (hyperlipidemia)   Depression   GERD (gastroesophageal reflux disease)   Closed right hip fracture (HCC)   Aspiration pneumonia (HCC)   Fever: recurred today E. Coli Bacteremia Concern for Aspiration Pneumonia - covid-19 negative (11/4) - chest xray with diffuse interstitial prominence which could be edema or atypical infection, concern for aspiration - ID consulted - follow up culture sensitivities - Unasyn d/c'd and started meropenem for empiric ESBL coverage for now - Tylenol PRN  Right hip fracture, closed status post fall -Status post ORIF 02/18/2019 -PT/OT -Pain control, bowel regimen -Lovenox for postop DVT prophylaxis. - agrees to SNF, accepted [facility]  Constipation - bowel regimen ordered, please give  - will add Senna  Dysphagia -Seen by speech therapist, mild aspiration risk.  Recommend dysphagia 3 diet.  History of Parkinson's disease -Continue Sinemet  History of anxiety/depression -Continue Lexapro and Klonopin  GERD -PPI   DVT prophylaxis:Lovenox Code Status: Full Code Family Communication:wife at bedside, updated today, 11/5 Disposition Plan:To SNF in next day or two.  Now with bacteremia and recurrence of fever.  Awaiting culture ID & sensitivities   Consultants:  ortho  Procedures:  ORIF 02/18/19  Antimicrobials:  Unasyn, start 11/4, stopped 11/6  Meropenem, start 11/6, stop TBD  Objective: Vitals:   02/21/19 0815 02/21/19 1700 02/21/19 2341 02/22/19 0758  BP: (!) 151/69 134/69 (!) 155/94 (!) 150/91  Pulse: 61 64 (!) 102 (!) 108  Resp: 18 19 20 16   Temp: 97.8 F (36.6 C) 100.1 F (37.8 C) 98.4 F (36.9 C) (!) 97.4 F (36.3 C)  TempSrc:  Oral  Oral  SpO2: 100% 96% 94% 94%  Weight:      Height:        Intake/Output Summary (Last 24 hours) at 02/22/2019 1215 Last data filed at 02/22/2019 1128 Gross per 24 hour  Intake 560 ml  Output 425 ml  Net 135 ml   Filed Weights   02/16/19 1630 02/17/19 1959 02/18/19 1344  Weight: 83.9 kg 86.8 kg 86 kg    Examination:  General exam: awake, alert, no acute distress HEENT: atraumatic, clear conjunctiva, anicteric sclera, moist mucus membranes, hearing grossly normal  Respiratory system: clear to auscultation bilaterally, no wheezes, rales or rhonchi, normal respiratory effort. Cardiovascular system: normal S1/S2, RRR, no JVD, murmurs, rubs, gallops, no pedal edema.   Gastrointestinal system: soft, non-tender, non-distended abdomen, no organomegaly or masses felt, normal bowel  sounds. Central nervous system: alert and oriented x3. no gross focal neurologic deficits Extremities: right hip surgical site with intact dressing, no edema, normal tone Skin: dry, intact, warm on palpation Psychiatry: normal mood, flat affect    Data Reviewed: I have personally  reviewed following labs and imaging studies  CBC: Recent Labs  Lab 02/16/19 1640  02/18/19 0333 02/19/19 0518 02/20/19 0544 02/21/19 0428 02/22/19 0518  WBC 8.5   < > 8.8 9.4 10.2 14.5* 7.6  NEUTROABS 6.2  --   --   --   --   --  6.0  HGB 14.6   < > 13.1 11.8* 11.8* 12.3* 10.5*  HCT 42.8   < > 39.0 34.5* 33.9* 36.6* 31.4*  MCV 89.4   < > 89.4 88.0 88.3 90.6 89.0  PLT 182   < > 150 141* 154 141* 148*   < > = values in this interval not displayed.   Basic Metabolic Panel: Recent Labs  Lab 02/18/19 0333 02/19/19 0518 02/20/19 0544 02/21/19 0428 02/22/19 0518  NA 139 136 136 137 135  K 3.9 3.3* 4.0 3.8 3.6  CL 105 104 105 104 104  CO2 26 24 21* 24 23  GLUCOSE 134* 153* 120* 130* 113*  BUN 21 19 19 21  24*  CREATININE 0.87 0.88 0.77 0.83 0.89  CALCIUM 8.4* 8.1* 7.9* 8.3* 7.9*  MG 2.1 1.7 2.0 2.4 2.2   GFR: Estimated Creatinine Clearance: 85.1 mL/min (by C-G formula based on SCr of 0.89 mg/dL). Liver Function Tests: No results for input(s): AST, ALT, ALKPHOS, BILITOT, PROT, ALBUMIN in the last 168 hours. No results for input(s): LIPASE, AMYLASE in the last 168 hours. No results for input(s): AMMONIA in the last 168 hours. Coagulation Profile: Recent Labs  Lab 02/16/19 1640  INR 1.1   Cardiac Enzymes: No results for input(s): CKTOTAL, CKMB, CKMBINDEX, TROPONINI in the last 168 hours. BNP (last 3 results) No results for input(s): PROBNP in the last 8760 hours. HbA1C: No results for input(s): HGBA1C in the last 72 hours. CBG: No results for input(s): GLUCAP in the last 168 hours. Lipid Profile: No results for input(s): CHOL, HDL, LDLCALC, TRIG, CHOLHDL, LDLDIRECT in the last 72 hours. Thyroid Function Tests: No results for input(s): TSH, T4TOTAL, FREET4, T3FREE, THYROIDAB in the last 72 hours. Anemia Panel: No results for input(s): VITAMINB12, FOLATE, FERRITIN, TIBC, IRON, RETICCTPCT in the last 72 hours. Sepsis Labs: No results for input(s): PROCALCITON,  LATICACIDVEN in the last 168 hours.  Recent Results (from the past 240 hour(s))  SARS Coronavirus 2 by RT PCR (hospital order, performed in [facility] hospital lab) Nasopharyngeal Nasopharyngeal Swab     Status: None   Collection Time: 02/16/19  4:40 PM   Specimen: Nasopharyngeal Swab  Result Value Ref Range Status   SARS Coronavirus 2 NEGATIVE NEGATIVE Final    Comment: (NOTE) If result is NEGATIVE SARS-CoV-2 target nucleic acids are NOT DETECTED. The SARS-CoV-2 RNA is generally detectable in upper and lower  respiratory specimens during the acute phase of infection. The lowest  concentration of SARS-CoV-2 viral copies this assay can detect is 250  copies / mL. A negative result does not preclude SARS-CoV-2 infection  and should not be used as the sole basis for treatment or other  patient management decisions.  A negative result may occur with  improper specimen collection / handling, submission of specimen other  than nasopharyngeal swab, presence of viral mutation(s) within the  areas targeted by this assay, and inadequate  number of viral copies  (<250 copies / mL). A negative result must be combined with clinical  observations, patient history, and epidemiological information. If result is POSITIVE SARS-CoV-2 target nucleic acids are DETECTED. The SARS-CoV-2 RNA is generally detectable in upper and lower  respiratory specimens dur ing the acute phase of infection.  Positive  results are indicative of active infection with SARS-CoV-2.  Clinical  correlation with patient history and other diagnostic information is  necessary to determine patient infection status.  Positive results do  not rule out bacterial infection or co-infection with other viruses. If result is PRESUMPTIVE POSTIVE SARS-CoV-2 nucleic acids MAY BE PRESENT.   A presumptive positive result was obtained on the submitted specimen  and confirmed on repeat testing.  While 2019 novel coronavirus  (SARS-CoV-2)  nucleic acids may be present in the submitted sample  additional confirmatory testing may be necessary for epidemiological  and / or clinical management purposes  to differentiate between  SARS-CoV-2 and other Sarbecovirus currently known to infect humans.  If clinically indicated additional testing with an alternate test  methodology [phone]) is advised. The SARS-CoV-2 RNA is generally  detectable in upper and lower respiratory sp ecimens during the acute  phase of infection. The expected result is Negative. Fact Sheet for Patients:  StrictlyIdeas.no Fact Sheet for Healthcare Providers: BankingDealers.co.za This test is not yet approved or cleared by the [place] FDA and has been authorized for detection and/or diagnosis of SARS-CoV-2 by FDA under an Emergency Use Authorization (EUA).  This EUA will remain in effect (meaning this test can be used) for the duration of the COVID-19 declaration under Section 564(b)(1) of the Act, 21 U.S.C. section 360bbb-3(b)(1), unless the authorization is terminated or revoked sooner. Performed at [facility], [address], [place], [place] [address]   Surgical pcr screen     Status: None   Collection Time: 02/17/19  8:27 PM   Specimen: Nasal Mucosa; Nasal Swab  Result Value Ref Range Status   MRSA, PCR NEGATIVE NEGATIVE Final   Staphylococcus aureus NEGATIVE NEGATIVE Final    Comment: (NOTE) The Xpert SA Assay (FDA approved for NASAL specimens in patients 60 years of age and older), is one component of a comprehensive surveillance program. It is not intended to diagnose infection nor to guide or monitor treatment. Performed at [facility], [place], [place] [address]   SARS CORONAVIRUS 2 (TAT 6-24 HRS) Nasopharyngeal Nasopharyngeal Swab     Status: None   Collection Time: 02/20/19  2:45 PM   Specimen: Nasopharyngeal Swab  Result Value Ref Range Status    SARS Coronavirus 2 NEGATIVE NEGATIVE Final    Comment: (NOTE) SARS-CoV-2 target nucleic acids are NOT DETECTED. The SARS-CoV-2 RNA is generally detectable in upper and lower respiratory specimens during the acute phase of infection. Negative results do not preclude SARS-CoV-2 infection, do not rule out co-infections with other pathogens, and should not be used as the sole basis for treatment or other patient management decisions. Negative results must be combined with clinical observations, patient history, and epidemiological information. The expected result is Negative. Fact Sheet for Patients: SugarRoll.be Fact Sheet for Healthcare Providers: https://www.woods-mathews.com/ This test is not yet approved or cleared by the [place] FDA and  has been authorized for detection and/or diagnosis of SARS-CoV-2 by FDA under an Emergency Use Authorization (EUA). This EUA will remain  in effect (meaning this test can be used) for the duration of the COVID-19 declaration under Section 56  4(b)(1) of the Act, 21 U.S.C. section 360bbb-3(b)(1), unless the authorization is terminated or revoked sooner. Performed at [facility], [place] [address], [place], [place] [address]   CULTURE, BLOOD (ROUTINE X 2) w Reflex to ID Panel     Status: None (Preliminary result)   Collection Time: 02/20/19  7:40 PM   Specimen: BLOOD  Result Value Ref Range Status   Specimen Description   Final    BLOOD Blood Culture results may not be optimal due to an inadequate volume of blood received in culture bottles   Special Requests BOTTLES DRAWN AEROBIC ONLY RIGHT ANTECUBITAL  Final   Culture   Final    NO GROWTH < 12 HOURS Performed at [facility], [address] [place], [place] [address]    Report Status PENDING  Incomplete  CULTURE, BLOOD (ROUTINE X 2) w Reflex to ID Panel     Status: None (Preliminary result)   Collection Time: 02/20/19  8:49 PM    Specimen: BLOOD  Result Value Ref Range Status   Specimen Description BLOOD LEFT HAND  Final   Special Requests   Final    BOTTLES DRAWN AEROBIC AND ANAEROBIC Blood Culture adequate volume   Culture  Setup Time   Final    Organism ID to follow GRAM NEGATIVE RODS ANAEROBIC BOTTLE ONLY CRITICAL RESULT CALLED TO, READ BACK BY AND VERIFIED WITH: [facility] DUNCAN AT [id number] 02/22/2019.PMF Performed at [facility], [address] [place], [place] [address]    Culture GRAM NEGATIVE RODS  Final   Report Status PENDING  Incomplete  Blood Culture ID Panel (Reflexed)     Status: Abnormal   Collection Time: 02/20/19  8:49 PM  Result Value Ref Range Status   Enterococcus species NOT DETECTED NOT DETECTED Final   Listeria monocytogenes NOT DETECTED NOT DETECTED Final   Staphylococcus species NOT DETECTED NOT DETECTED Final   Staphylococcus aureus (BCID) NOT DETECTED NOT DETECTED Final   Streptococcus species NOT DETECTED NOT DETECTED Final   Streptococcus agalactiae NOT DETECTED NOT DETECTED Final   Streptococcus pneumoniae NOT DETECTED NOT DETECTED Final   Streptococcus pyogenes NOT DETECTED NOT DETECTED Final   Acinetobacter baumannii NOT DETECTED NOT DETECTED Final   Enterobacteriaceae species DETECTED (A) NOT DETECTED Final    Comment: Enterobacteriaceae represent a large family of gram-negative bacteria, not a single organism. CRITICAL RESULT CALLED TO, READ BACK BY AND VERIFIED WITH: [facility] DUNCAN AT [id number] 02/22/2019.PMF    Enterobacter cloacae complex NOT DETECTED NOT DETECTED Final   Escherichia coli DETECTED (A) NOT DETECTED Final    Comment: CRITICAL RESULT CALLED TO, READ BACK BY AND VERIFIED WITH: [facility] DUNCAN AT [id number] 02/22/2019.PMF    Klebsiella oxytoca NOT DETECTED NOT DETECTED Final   Klebsiella pneumoniae NOT DETECTED NOT DETECTED Final   Proteus species NOT DETECTED NOT DETECTED Final   Serratia marcescens NOT DETECTED NOT DETECTED Final   Carbapenem resistance NOT  DETECTED NOT DETECTED Final   Haemophilus influenzae NOT DETECTED NOT DETECTED Final   Neisseria meningitidis NOT DETECTED NOT DETECTED Final   Pseudomonas aeruginosa NOT DETECTED NOT DETECTED Final   Candida albicans NOT DETECTED NOT DETECTED Final   Candida glabrata NOT DETECTED NOT DETECTED Final   Candida krusei NOT DETECTED NOT DETECTED Final   Candida parapsilosis NOT DETECTED NOT DETECTED Final   Candida tropicalis NOT DETECTED NOT DETECTED Final    Comment: Performed at [facility], [address], [place], [place] [address]  Radiology Studies: Dg Chest Port 1 View  Result Date: 02/20/2019 CLINICAL DATA:  68 year old male with fever. EXAM: PORTABLE CHEST 1 VIEW COMPARISON:  Chest radiograph dated 02/16/2019 FINDINGS: There is shallow inspiration with bibasilar atelectasis. Diffuse interstitial prominence may represent mild congestion or edema. Atypical infection is not excluded. Clinical correlation is recommended. No focal consolidation, pleural effusion, or pneumothorax. Stable cardiomegaly. Stimulator device noted over the chest. No acute osseous pathology. IMPRESSION: 1. Shallow inspiration with bibasilar atelectasis. 2. Diffuse interstitial prominence may represent mild edema or atypical infection. 3. Stable cardiomegaly. Electronically Signed   By: Anner Crete M.D.   On: 02/20/2019 15:44        Scheduled Meds: . carbidopa-levodopa  1 tablet Oral BID AC  . carbidopa-levodopa  2 tablet Oral Q breakfast  . cholecalciferol  1,000 Units Oral Daily  . docusate sodium  100 mg Oral BID  . enoxaparin (LOVENOX) injection  40 mg Subcutaneous Q24H  . escitalopram  10 mg Oral Daily  . Melatonin  10 mg Oral QHS  . memantine  10 mg Oral BID  . pantoprazole  40 mg Oral Daily  . traMADol  50 mg Oral Q6H   Continuous Infusions: . methocarbamol (ROBAXIN) IV       LOS: 6 days    Time spent: 30-35 min    Ezekiel Slocumb, DO [facility] Pager:  [phone]  If 7PM-7AM, please contact night-coverage [email or web address] Password [facility] 02/22/2019, 12:15 PM

## 2019-02-22 NOTE — Progress Notes (Signed)
PHARMACY - PHYSICIAN COMMUNICATION CRITICAL VALUE ALERT - BLOOD CULTURE IDENTIFICATION (BCID)  [name] is an 68 y.o. male who presented to [facility] on 02/16/2019 with a chief complaint of fall/hip fracture  Assessment:  Pt febrile on 11/4, started on Unasyn for possible aspiration PNA  Name of physician (or Provider) Contacted: Dr Arbutus Ped  Current antibiotics: Augmentin  Changes to prescribed antibiotics recommended:  Recommended starting meropenem per protocol. Hospitalist stated will discuss with ID, so hold off for now   Results for orders placed or performed during the hospital encounter of 02/16/19  Blood Culture ID Panel (Reflexed) (Collected: 02/20/2019  8:49 PM)  Result Value Ref Range   Enterococcus species NOT DETECTED NOT DETECTED   Listeria monocytogenes NOT DETECTED NOT DETECTED   Staphylococcus species NOT DETECTED NOT DETECTED   Staphylococcus aureus (BCID) NOT DETECTED NOT DETECTED   Streptococcus species NOT DETECTED NOT DETECTED   Streptococcus agalactiae NOT DETECTED NOT DETECTED   Streptococcus pneumoniae NOT DETECTED NOT DETECTED   Streptococcus pyogenes NOT DETECTED NOT DETECTED   Acinetobacter baumannii NOT DETECTED NOT DETECTED   Enterobacteriaceae species DETECTED (A) NOT DETECTED   Enterobacter cloacae complex NOT DETECTED NOT DETECTED   Escherichia coli DETECTED (A) NOT DETECTED   Klebsiella oxytoca NOT DETECTED NOT DETECTED   Klebsiella pneumoniae NOT DETECTED NOT DETECTED   Proteus species NOT DETECTED NOT DETECTED   Serratia marcescens NOT DETECTED NOT DETECTED   Carbapenem resistance NOT DETECTED NOT DETECTED   Haemophilus influenzae NOT DETECTED NOT DETECTED   Neisseria meningitidis NOT DETECTED NOT DETECTED   Pseudomonas aeruginosa NOT DETECTED NOT DETECTED   Candida albicans NOT DETECTED NOT DETECTED   Candida glabrata NOT DETECTED NOT DETECTED   Candida krusei NOT DETECTED NOT DETECTED   Candida parapsilosis NOT DETECTED NOT  DETECTED   Candida tropicalis NOT DETECTED NOT DETECTED    Rocky Morel 02/22/2019  9:23 AM

## 2019-02-22 NOTE — Progress Notes (Signed)
Physical Therapy Treatment Patient Details Name: [name] MRN: [id number] DOB: 1950/08/28 Today's Date: 02/22/2019    History of Present Illness Pt is a 68 y.o. male presenting to hospital 02/16/19 s/p fall onto R hip when walking without walker and possibly hit head.  Pt s/p IM fixation 11/2 secondary R intertrochanteric hip fx.  PMH includes Parkinson's, B DBS, bradycardia, PTSD, dysrhythmia, htn, sleep apnea, SOB, Dupuytren's contracture, h/o L-spine surgery.    PT Comments    Pt appearing wide awake and made a joke beginning of session but during therapy session pt intermittently closing his eyes requiring cueing to open his eyes; pt appearing to be sleeping end of session resting in bed.  Pt appearing very "stiff" overall beginning of session (therapist performed AAROM B LE ex's which relaxed pt's LE's; attempted UE ROM but pt very "stiff" and L UE "froze" in around 80 degrees shoulder flexion (took extra time to relax and lower back down to resting position).  2 assist for bed mobility.  Pt initially requiring mod assist x2 for sitting balance but with vc's and tactile cues and extra time/practice able to improve to close SBA briefly (pt mostly with R posterior lean during session activities).  Able to improve sitting balance enough to attempt standing x2 trials but pt only able to come to 1/3rd stand with 2 assist (pt noted with decreased initiation pushing through B LE's 2nd attempt so pt assisted back to bed).  Will continue to focus on strengthening, ROM, and progressive functional mobility per pt tolerance.   Follow Up Recommendations  SNF     Equipment Recommendations  3in1 (PT);Wheelchair (measurements PT);Wheelchair cushion (measurements PT)    Recommendations for Other Services OT consult     Precautions / Restrictions Precautions Precautions: Fall Restrictions Weight Bearing Restrictions: Yes RLE Weight Bearing: Weight bearing as tolerated    Mobility  Bed  Mobility Overal bed mobility: Needs Assistance Bed Mobility: Supine to Sit;Sit to Supine     Supine to sit: Max assist;+2 for physical assistance Sit to supine: Max assist;+2 for physical assistance   General bed mobility comments: assist for trunk and B LE's; increased assist required d/t pt with difficulty initiating movement and appearing very "stiff" overall  Transfers Overall transfer level: Needs assistance Equipment used: (U-step walker) Transfers: Sit to/from Stand Sit to Stand: Max assist;+2 physical assistance         General transfer comment: x2 trials standing up to U-step walker; R knee blocked; assist for B hand placement; vc's for B foot placement; assist to initiate stand up to walker; able to clear pt's bottom from bed both trials (grossly 1/3rd stand each trial); decreased initiation noted 2nd trial  Ambulation/Gait             General Gait Details: unable this date   Stairs             Wheelchair Mobility    Modified Rankin (Stroke Patients Only)       Balance Overall balance assessment: Needs assistance Sitting-balance support: Bilateral upper extremity supported;Feet supported Sitting balance-Leahy Scale: Zero Sitting balance - Comments: initially pt with significant R and posterior lean in sitting requiring vc's and tactile cues and assist to maintain upright (initially mod assist x2 sitting balance but progressed to max assist x1 and then eventually able to progress to close SBA briefly with extra time and practice for sitting balance) Postural control: Right lateral lean;Posterior lean Standing balance support: Bilateral upper extremity supported Standing balance-Leahy Scale:  Zero Standing balance comment: pt with posterior lean with standing attempts with mild R lean                            Cognition Arousal/Alertness: Lethargic Behavior During Therapy: Flat affect Overall Cognitive Status: Impaired/Different from  baseline                                 General Comments: Pt made a joke beginning of session and appeared wide awake but became drowsy/sleepy (closing his eyes) during session.      Exercises Total Joint Exercises Heel Slides: AAROM;Strengthening;Both;10 reps;Supine Hip ABduction/ADduction: AAROM;Strengthening;Both;10 reps;Supine    General Comments   Nursing cleared pt for participation in physical therapy.  Pt agreeable to PT session.  [name] present beginning/end of session (left during session's activities to see if pt would do better without her there).      Pertinent Vitals/Pain Pain Assessment: Faces Faces Pain Scale: Hurts a little bit Pain Location: R hip/thigh at rest Pain Descriptors / Indicators: Sore Pain Intervention(s): Limited activity within patient's tolerance;Monitored during session;Repositioned    Home Living                      Prior Function            PT Goals (current goals can now be found in the care plan section) Acute Rehab PT Goals Patient Stated Goal: to improve mobility PT Goal Formulation: With patient/family Time For Goal Achievement: 03/05/19 Potential to Achieve Goals: Good Progress towards PT goals: Progressing toward goals    Frequency    BID      PT Plan Current plan remains appropriate    Co-evaluation              AM-PAC PT "6 Clicks" Mobility   Outcome Measure  Help needed turning from your back to your side while in a flat bed without using bedrails?: Total Help needed moving from lying on your back to sitting on the side of a flat bed without using bedrails?: Total Help needed moving to and from a bed to a chair (including a wheelchair)?: Total Help needed standing up from a chair using your arms (e.g., wheelchair or bedside chair)?: Total Help needed to walk in hospital room?: Total Help needed climbing 3-5 steps with a railing? : Total 6 Click Score: 6    End of Session  Equipment Utilized During Treatment: Gait belt Activity Tolerance: Patient limited by fatigue Patient left: in bed;with call bell/phone within reach;with bed alarm set;with family/visitor present;Other (comment);with SCD's reapplied(B heels floating via pillow) Nurse Communication: Mobility status PT Visit Diagnosis: Other abnormalities of gait and mobility (R26.89);Muscle weakness (generalized) (M62.81);Repeated falls (R29.6);History of falling (Z91.81);Difficulty in walking, not elsewhere classified (R26.2);Pain Pain - Right/Left: Right Pain - part of body: Hip     Time: 1003-1046 PT Time Calculation (min) (ACUTE ONLY): 43 min  Charges:  $Therapeutic Exercise: 8-22 mins $Therapeutic Activity: 23-37 mins                     Leitha Bleak, PT 02/22/19, 1:28 PM [phone]

## 2019-02-22 NOTE — Evaluation (Signed)
Objective Swallowing Evaluation: Type of Study: Bedside Swallow Evaluation   Patient Details  Name: [name] MRN: [id number] Date of Birth: Jul 21, 1950  Today's Date: 02/22/2019 Time: SLP Start Time (ACUTE ONLY): 1400 -SLP Stop Time (ACUTE ONLY): 1455  SLP Time Calculation (min) (ACUTE ONLY): 55 min   Past Medical History:  Past Medical History:  Diagnosis Date  . Arthritis   . Bradycardia   . Cancer [facility]) 2013   skin cancer  . Depression    ptsd  . Dysrhythmia    chronic slow heart rate  . GERD (gastroesophageal reflux disease)   . Headache(784.0)    tension headaches non recent  . History of chicken pox   . History of kidney stones    passed  . Hypertension    treated with HCTZ  . Parkinson's disease ([place])    dx'ed 15 years ago  . PTSD (post-traumatic stress disorder)   . Shortness of breath dyspnea   . Sleep apnea    doesn't use C-pap  . Varicose veins    Past Surgical History:  Past Surgical History:  Procedure Laterality Date  . CHOLECYSTECTOMY N/A 10/22/2014   Procedure: LAPAROSCOPIC CHOLECYSTECTOMY WITH INTRAOPERATIVE CHOLANGIOGRAM;  Surgeon: Dia Crawford III, MD;  Location: [facility] ORS;  Service: General;  Laterality: N/A;  . cyst removed      from lip as a child  . INTRAMEDULLARY (IM) NAIL INTERTROCHANTERIC N/A 02/18/2019   Procedure: INTRAMEDULLARY (IM) NAIL INTERTROCHANTRIC, RIGHT,;  Surgeon: Thornton Park, MD;  Location: [facility] ORS;  Service: Orthopedics;  Laterality: N/A;  . LUMBAR LAMINECTOMY/DECOMPRESSION MICRODISCECTOMY Bilateral 12/14/2012   Procedure: Bilateral lumbar three-four, four-five decompressive laminotomy/foraminotomy;  Surgeon: Charlie Pitter, MD;  Location: [place] NEURO ORS;  Service: Neurosurgery;  Laterality: Bilateral;  . PULSE GENERATOR IMPLANT Bilateral 12/13/2013   Procedure: Bilateral implantable pulse generator placement;  Surgeon: Erline Levine, MD;  Location: [place] NEURO ORS;  Service: Neurosurgery;  Laterality: Bilateral;  Bilateral  implantable pulse generator placement  . skin cancer removed     from ears,   12 lft arm  rt leg 15  . SUBTHALAMIC STIMULATOR BATTERY REPLACEMENT Bilateral 07/14/2017   Procedure: BILATERAL IMPLANTED PULSE GENERATOR CHANGE FOR DEEP BRAIN STIMULATOR;  Surgeon: Erline Levine, MD;  Location: [place];  Service: Neurosurgery;  Laterality: Bilateral;  . SUBTHALAMIC STIMULATOR INSERTION Bilateral 12/06/2013   Procedure: SUBTHALAMIC STIMULATOR INSERTION;  Surgeon: Erline Levine, MD;  Location: [place] NEURO ORS;  Service: Neurosurgery;  Laterality: Bilateral;  Bilateral deep brain stimulator placement   HPI: [name] is a 68 y.o. male with a known history of essential hypertension, Parkinson's disease status post brain stimulator, bradycardia, PTSD, obstructive sleep apnea who presents to the hospital after mechanical fall noted to have a right hip fracture.  Patient was in his usual state of health and was walking in his backyard without his walker when he lost his footing and fell to the ground.  He did not lose consciousness, he had no head trauma he had no prodromal symptoms of chest pain, shortness of breath palpitations or any other associated symptoms.  He presented to the ER and underwent x-ray which showed right hip fracture.  Hospitalist services were contacted for admission.  Chest X-ray clear.  Surgery for hip fracture 02/18/2019.  Last MBS April/2017 with recommendation for a regular diet with thin liquids.  Per patient and wife- no problems with this diet until this hospital admission - suspect related to the acute trauma/illness.  CXR 02/21/2019: revealed "diffuse interstitial prominence may  represent mild congestion or edema".    Subjective: Patient very drowsy    Assessment / Plan / Recommendation  CHL IP CLINICAL IMPRESSIONS 02/22/2019  Clinical Impression The patient is presenting with a significant decline in swallow function since yesterday.  He has a severe delay in eliciting the swallow with  severe pharyngeal residue post swallow.  He has laryngeal penetration/aspiration with audible and effective cough.  Given the abrupt change and audible signs of aspiration, recommend ice chips only. SLP will re-assess tomorrow.    SLP Visit Diagnosis Dysphagia, oropharyngeal phase (R13.12)  Attention and concentration deficit following --  Frontal lobe and executive function deficit following --  Impact on safety and function Moderate aspiration risk;Risk for inadequate nutrition/hydration      CHL IP TREATMENT RECOMMENDATION 02/19/2019  Treatment Recommendations Therapy as outlined in treatment plan below     Prognosis 02/19/2019  Prognosis for Safe Diet Advancement Good  Barriers to Reach Goals Medication  Barriers/Prognosis Comment --    CHL IP DIET RECOMMENDATION 02/22/2019  SLP Diet Recommendations Ice chips PRN after oral care  Liquid Administration via --  Medication Administration --  Compensations --  Postural Changes --      CHL IP OTHER RECOMMENDATIONS 02/22/2019  Recommended Consults --  Oral Care Recommendations Oral care BID;Staff/trained caregiver to provide oral care  Other Recommendations --      CHL IP FOLLOW UP RECOMMENDATIONS 02/21/2019  Follow up Recommendations (No Data)      CHL IP FREQUENCY AND DURATION 02/19/2019  Speech Therapy Frequency (ACUTE ONLY) min 3x week  Treatment Duration 2 weeks           CHL IP ORAL PHASE 02/22/2019  Oral Phase Impaired  Oral - Pudding Teaspoon --  Oral - Pudding Cup --  Oral - Honey Teaspoon --  Oral - Honey Cup --  Oral - Nectar Teaspoon --  Oral - Nectar Cup --  Oral - Nectar Straw --  Oral - Thin Teaspoon --  Oral - Thin Cup --  Oral - Thin Straw --  Oral - Puree --  Oral - Mech Soft --  Oral - Regular --  Oral - Multi-Consistency --  Oral - Pill --  Oral Phase - Comment Oral hold over  minutes    CHL IP PHARYNGEAL PHASE 02/22/2019  Pharyngeal Phase Impaired  Pharyngeal- Pudding Teaspoon --  Pharyngeal  --  Pharyngeal- Pudding Cup --  Pharyngeal --  Pharyngeal- Honey Teaspoon --  Pharyngeal --  Pharyngeal- Honey Cup --  Pharyngeal --  Pharyngeal- Nectar Teaspoon --  Pharyngeal --  Pharyngeal- Nectar Cup --  Pharyngeal --  Pharyngeal- Nectar Straw --  Pharyngeal --  Pharyngeal- Thin Teaspoon --  Pharyngeal --  Pharyngeal- Thin Cup --  Pharyngeal --  Pharyngeal- Thin Straw --  Pharyngeal --  Pharyngeal- Puree --  Pharyngeal --  Pharyngeal- Mechanical Soft --  Pharyngeal --  Pharyngeal- Regular --  Pharyngeal --  Pharyngeal- Multi-consistency --  Pharyngeal --  Pharyngeal- Pill --  Pharyngeal --  Pharyngeal Comment Reduced tongue base retraction, hyolaryngeal excursion, incomplete epiglottic inversion. Severe residue.  Laryngeal penetration/aspiration of pharyngeal residue- effective cough     No flowsheet data found.  Leroy Sea, MS/CCC- SLP  Lou Miner 02/22/2019, 2:56 PM

## 2019-02-22 NOTE — TOC Progression Note (Signed)
Transition of Care [facility]) - Progression Note    Patient Details  Name: [name] MRN: [id number] Date of Birth: 1950-10-07  Transition of [facility] CM/SW Contact  Su Hilt, RN Phone Number: 02/22/2019, 12:26 PM  Clinical Narrative:    [facility] and notified them that the patient will not be coming today due to UTI, May be tomorrow [facility] to push out the start date, I was requested to fax notes to [phone]   Expected Discharge Plan: [place] Barriers to Discharge: Ship broker, Continued Medical Work up, SNF Pending bed offer  Expected Discharge Plan and Services Expected Discharge Plan: [place]   Discharge Planning Services: CM Consult   Living arrangements for the past 2 months: Single Family Home                                       Social Determinants of Health (SDOH) Interventions    Readmission Risk Interventions No flowsheet data found.

## 2019-02-22 NOTE — Progress Notes (Signed)
Urine obtained and forwarded to lab as ordered.  Bisacodyl suppository administered as ordered.

## 2019-02-22 NOTE — Progress Notes (Signed)
Subjective:  POD #4 s/p IM fixation for right IT hip fracture.   Patient reports right hip pain as mild to moderate.  Patient has e.coli bacteremia by blood cultures.  Unasyn changed to meropenem.  Patient more alert today.  Objective:   VITALS:   Vitals:   02/21/19 0815 02/21/19 1700 02/21/19 2341 02/22/19 0758  BP: (!) 151/69 134/69 (!) 155/94 (!) 150/91  Pulse: 61 64 (!) 102 (!) 108  Resp: 18 19 20 16   Temp: 97.8 F (36.6 C) 100.1 F (37.8 C) 98.4 F (36.9 C) (!) 97.4 F (36.3 C)  TempSrc:  Oral  Oral  SpO2: 100% 96% 94% 94%  Weight:      Height:        PHYSICAL EXAM: Right lower extremity: No erythema or swelling.  Neurovascular intact Sensation intact distally Intact pulses distally Dorsiflexion/Plantar flexion intact Dressing: scant serous drainage.  No cellulitis present Compartment soft  LABS  Results for orders placed or performed during the hospital encounter of 02/16/19 (from the past 24 hour(s))  Basic metabolic panel     Status: Abnormal   Collection Time: 02/22/19  5:18 AM  Result Value Ref Range   Sodium 135 135 - 145 mmol/L   Potassium 3.6 3.5 - 5.1 mmol/L   Chloride 104 98 - 111 mmol/L   CO2 23 22 - 32 mmol/L   Glucose, Bld 113 (H) 70 - 99 mg/dL   BUN 24 (H) 8 - 23 mg/dL   Creatinine, Ser 0.89 0.61 - 1.24 mg/dL   Calcium 7.9 (L) 8.9 - 10.3 mg/dL   GFR calc non Af Amer >60 >60 mL/min   GFR calc Af Amer >60 >60 mL/min   Anion gap 8 5 - 15  Magnesium     Status: None   Collection Time: 02/22/19  5:18 AM  Result Value Ref Range   Magnesium 2.2 1.7 - 2.4 mg/dL  CBC with Differential/Platelet     Status: Abnormal   Collection Time: 02/22/19  5:18 AM  Result Value Ref Range   WBC 7.6 4.0 - 10.5 K/uL   RBC 3.53 (L) 4.22 - 5.81 MIL/uL   Hemoglobin 10.5 (L) 13.0 - 17.0 g/dL   HCT 31.4 (L) 39.0 - 52.0 %   MCV 89.0 80.0 - 100.0 fL   MCH 29.7 26.0 - 34.0 pg   MCHC 33.4 30.0 - 36.0 g/dL   RDW 13.2 11.5 - 15.5 %   Platelets 148 (L) 150 - 400 K/uL    nRBC 0.0 0.0 - 0.2 %   Neutrophils Relative % 78 %   Neutro Abs 6.0 1.7 - 7.7 K/uL   Lymphocytes Relative 11 %   Lymphs Abs 0.9 0.7 - 4.0 K/uL   Monocytes Relative 9 %   Monocytes Absolute 0.7 0.1 - 1.0 K/uL   Eosinophils Relative 1 %   Eosinophils Absolute 0.1 0.0 - 0.5 K/uL   Basophils Relative 0 %   Basophils Absolute 0.0 0.0 - 0.1 K/uL   Immature Granulocytes 1 %   Abs Immature Granulocytes 0.05 0.00 - 0.07 K/uL    Dg Chest Port 1 View  Result Date: 02/20/2019 CLINICAL DATA:  68 year old male with fever. EXAM: PORTABLE CHEST 1 VIEW COMPARISON:  Chest radiograph dated 02/16/2019 FINDINGS: There is shallow inspiration with bibasilar atelectasis. Diffuse interstitial prominence may represent mild congestion or edema. Atypical infection is not excluded. Clinical correlation is recommended. No focal consolidation, pleural effusion, or pneumothorax. Stable cardiomegaly. Stimulator device noted over the chest. No acute  osseous pathology. IMPRESSION: 1. Shallow inspiration with bibasilar atelectasis. 2. Diffuse interstitial prominence may represent mild edema or atypical infection. 3. Stable cardiomegaly. Electronically Signed   By: Anner Crete M.D.   On: 02/20/2019 15:44    Assessment/Plan: 4 Days Post-Op   Active Problems:   Essential hypertension   Erectile dysfunction   HLD (hyperlipidemia)   Depression   GERD (gastroesophageal reflux disease)   Closed right hip fracture (HCC)   Aspiration pneumonia (HCC)  Appreciate ID consult.  Continue meropenem.  Urine output being monitored by condom catheter.  Continue PT as medically appropriate.  Patient will need SNF upon discharge.   He is WBAT on the right hip with PT.  Patient should continue lovenox 40 mg daily for DVT prophylaxis x 2 weeks upon discharge.  Patient should follow up in my office in approximately 10 after discharge.    Thornton Park , MD 02/22/2019, 1:26 PM

## 2019-02-22 NOTE — Progress Notes (Signed)
Occupational Therapy Treatment Patient Details Name: [name] MRN: [id number] DOB: 04-18-1951 Today's Date: 02/22/2019    History of present illness Pt is a 68 y.o. male presenting to hospital 02/16/19 s/p fall onto R hip when walking without walker and possibly hit head.  Pt s/p IM fixation 11/2 secondary R intertrochanteric hip fx.  PMH includes Parkinson's, B DBS, bradycardia, PTSD, dysrhythmia, htn, sleep apnea, SOB, Dupuytren's contracture, h/o L-spine surgery.   OT comments  Pt seen for OT/PT Co-treatment on this date. Upon arrival to room pt sleeping soundly in bed with SLP finishing assessment. Per SLP pt had recently fallen asleep. Pt requires heavy VCs and sternal rub to open eyes, and has difficulty remaining awake at the start of the session. With verbal cueing from OT/PT this pt is able to maintain an alert state and is agreeable to attempting mobility this date. Pt requires +2 max assist to come to sitting EOB. OT engages pt in ADL tasks listed below. Pt requires consistent cueing to engage in tasks as well as CGA to minimal assistance to maintain seated balance this date. On this date, pt typically uses his LUE to complete grooming and bathing tasks with his RUE laying to his side. With multimodal prompting, pt is able to reach forward with his RUE to grasp a wet washcloth this date. OT/PT engage pt in STS transfers x2 this date, with pt able to attain approximately 1/3 standing position given max assist and B Knees blocked. Pt making progress toward goals and continues to benefit from skilled OT services to maximize return to PLOF and minimize risk of future falls, injury, caregiver burden, and readmission. Will continue to follow POC. Discharge recommendation remains appropriate.    Follow Up Recommendations  SNF    Equipment Recommendations  Other (comment)(TBD)    Recommendations for Other Services      Precautions / Restrictions Precautions Precautions:  Fall Restrictions Weight Bearing Restrictions: Yes RLE Weight Bearing: Weight bearing as tolerated       Mobility Bed Mobility Overal bed mobility: Needs Assistance Bed Mobility: Supine to Sit;Sit to Supine;Rolling Rolling: Max assist;+2 for physical assistance   Supine to sit: Max assist;+2 for physical assistance Sit to supine: Max assist;+2 for physical assistance;Mod assist   General bed mobility comments: +2 ssist for trunk and B LE's in/out of bed this date. +2 for rolling side to side during linnen change.  Transfers Overall transfer level: Needs assistance Equipment used: (U-step) Transfers: Sit to/from Stand Sit to Stand: Max assist;+2 physical assistance         General transfer comment: x2 trials standing up to U-step walker; B knees blocked; assist for B hand placement; vc's for B foot placement; assist to initiate stand up to walker; able to clear pt's bottom from bed both trials (grossly 1/3rd stand each trial). Unable to come to full stand with assist this date.    Balance Overall balance assessment: Needs assistance Sitting-balance support: Bilateral upper extremity supported;Feet supported Sitting balance-Leahy Scale: Poor Sitting balance - Comments: Pt sitting balance appears improved this PM. Is able to maintain sitting EOB given +1 CGA to min assist with intermittent posterior or forward lean during session. Postural control: Posterior lean Standing balance support: Bilateral upper extremity supported Standing balance-Leahy Scale: Zero Standing balance comment: pt with posterior lean with standing attempts with mild R lean  ADL either performed or assessed with clinical judgement   ADL Overall ADL's : Needs assistance/impaired Eating/Feeding: NPO Eating/Feeding Details (indicate cue type and reason): Per SLP Pt NPO s/p MBSS. Will continue to assess self-feeding as diet advanced. Grooming: Set up;Sitting;Wash/dry  face;Cueing for sequencing;Maximal assistance Grooming Details (indicate cue type and reason): Pt requires supervision to min assist to maintain seated balance during ADL tasks this date. During face washing, he is able to use his LUE to bring a wet washcloth up to his face, but has difficulty moving the washcloth further. When encouraged to use his RUE in the task the pt is able to use his RUE to grasp part of the cloth with his LUE grasping it as well, but does not bring the cloth to his face. Pt freezes about 3-4" away from his face and requires physical assistance to bring the cloth the remaining distance. OT provides max assist to complete facewashing this date.         Upper Body Dressing : Set up;Maximal assistance;Sitting Upper Body Dressing Details (indicate cue type and reason): Given +2 assisst pt is able to thread his L arm through a new gown during UB dressing this date. OT provides max assist to bring gown up the pt's arm, and snap gown around Pt's RUE which has a PIV. +2 assist for maintaining seated balance during this task.                   General ADL Comments: Pt unable to come to full stand from sitting EOB this date despite +2 max assist in 2 trials. Continues to require max assist for LB ADL mgt with +2 for seated balance.     Vision Baseline Vision/History: Wears glasses Wears Glasses: At all times Patient Visual Report: No change from baseline     Perception     Praxis      Cognition Arousal/Alertness: Lethargic Behavior During Therapy: Flat affect Overall Cognitive Status: Impaired/Different from baseline                                 General Comments: Pt difficult to wake at start of session, but does open eyes with sternal rub and VCs. Intermittently closes eyes during activity and appears to fall asleep one time while seated EOB. With prompting, pt is able to be re-directed to session activities and occasionally makes jokes with  therapists during session.        Exercises Total Joint Exercises Heel Slides: AAROM;Strengthening;Both;10 reps;Supine Hip ABduction/ADduction: AAROM;Strengthening;Both;10 reps;Supine Other Exercises Other Exercises: OT/PT engage pt in bed mobility, seated balance activities, and multiple STS trials this date. Other Exercises: OT engages pt in seated ADL tasks including using BUE for seated grooming and UB dressing tasks.   Shoulder Instructions       General Comments During bedding linnen change, pt noted to have dark reddish purple marks on buttocks wich felt firm to the touch and did not blanch with light pressure. RN notified vis secure message.    Pertinent Vitals/ Pain       Pain Assessment: No/denies pain Pain Intervention(s): Limited activity within patient's tolerance;Monitored during session;Repositioned  Home Living                                          Prior Functioning/Environment  Frequency  Min 2X/week        Progress Toward Goals  OT Goals(current goals can now be found in the care plan section)  Progress towards OT goals: Progressing toward goals  Acute Rehab OT Goals Patient Stated Goal: to improve mobility OT Goal Formulation: With patient Time For Goal Achievement: 03/05/19 Potential to Achieve Goals: Good  Plan Discharge plan remains appropriate;Frequency remains appropriate    Co-evaluation    PT/OT/SLP Co-Evaluation/Treatment: Yes Reason for Co-Treatment: Complexity of the patient's impairments (multi-system involvement);Necessary to address cognition/behavior during functional activity;For patient/therapist safety;To address functional/ADL transfers PT goals addressed during session: Mobility/safety with mobility;Balance;Proper use of DME;Strengthening/ROM OT goals addressed during session: ADL's and self-care;Strengthening/ROM      AM-PAC OT "6 Clicks" Daily Activity     Outcome Measure   Help from  another person eating meals?: A Lot(Currently NPO) Help from another person taking care of personal grooming?: A Lot Help from another person toileting, which includes using toliet, bedpan, or urinal?: Total Help from another person bathing (including washing, rinsing, drying)?: Total Help from another person to put on and taking off regular upper body clothing?: A Lot Help from another person to put on and taking off regular lower body clothing?: A Lot 6 Click Score: 10    End of Session Equipment Utilized During Treatment: Gait belt(U-step walker)  OT Visit Diagnosis: Other abnormalities of gait and mobility (R26.89);History of falling (Z91.81);Pain Pain - Right/Left: Right Pain - part of body: Hip   Activity Tolerance Patient tolerated treatment well   Patient Left in bed;with call bell/phone within reach;with bed alarm set;with SCD's reapplied   Nurse Communication          Time: [id number] OT Time Calculation (min): 40 min  Charges: OT General Charges $OT Visit: 1 Visit OT Treatments $Self Care/Home Management : 8-22 mins  Shara Blazing, M.S., OTR/L Ascom: [phone] 02/22/19, 4:24 PM

## 2019-02-23 DIAGNOSIS — R1310 Dysphagia, unspecified: Secondary | ICD-10-CM

## 2019-02-23 DIAGNOSIS — R1312 Dysphagia, oropharyngeal phase: Secondary | ICD-10-CM

## 2019-02-23 DIAGNOSIS — B9620 Unspecified Escherichia coli [E. coli] as the cause of diseases classified elsewhere: Secondary | ICD-10-CM

## 2019-02-23 DIAGNOSIS — R7881 Bacteremia: Secondary | ICD-10-CM

## 2019-02-23 LAB — CBC WITH DIFFERENTIAL/PLATELET
Abs Immature Granulocytes: 0.05 10*3/uL (ref 0.00–0.07)
Basophils Absolute: 0 10*3/uL (ref 0.0–0.1)
Basophils Relative: 0 %
Eosinophils Absolute: 0 10*3/uL (ref 0.0–0.5)
Eosinophils Relative: 1 %
HCT: 32.4 % — ABNORMAL LOW (ref 39.0–52.0)
Hemoglobin: 11 g/dL — ABNORMAL LOW (ref 13.0–17.0)
Immature Granulocytes: 1 %
Lymphocytes Relative: 19 %
Lymphs Abs: 0.7 10*3/uL (ref 0.7–4.0)
MCH: 30.1 pg (ref 26.0–34.0)
MCHC: 34 g/dL (ref 30.0–36.0)
MCV: 88.5 fL (ref 80.0–100.0)
Monocytes Absolute: 0.5 10*3/uL (ref 0.1–1.0)
Monocytes Relative: 15 %
Neutro Abs: 2.3 10*3/uL (ref 1.7–7.7)
Neutrophils Relative %: 64 %
Platelets: 149 10*3/uL — ABNORMAL LOW (ref 150–400)
RBC: 3.66 MIL/uL — ABNORMAL LOW (ref 4.22–5.81)
RDW: 13 % (ref 11.5–15.5)
WBC: 3.7 10*3/uL — ABNORMAL LOW (ref 4.0–10.5)
nRBC: 0 % (ref 0.0–0.2)

## 2019-02-23 LAB — BASIC METABOLIC PANEL
Anion gap: 10 (ref 5–15)
BUN: 22 mg/dL (ref 8–23)
CO2: 22 mmol/L (ref 22–32)
Calcium: 8 mg/dL — ABNORMAL LOW (ref 8.9–10.3)
Chloride: 105 mmol/L (ref 98–111)
Creatinine, Ser: 0.76 mg/dL (ref 0.61–1.24)
GFR calc Af Amer: >60 mL/min (ref >60)
GFR calc non Af Amer: >60 mL/min (ref >60)
Glucose, Bld: 98 mg/dL (ref 70–99)
Potassium: 3.5 mmol/L (ref 3.5–5.1)
Sodium: 137 mmol/L (ref 135–145)

## 2019-02-23 NOTE — Progress Notes (Signed)
Physical Therapy Treatment Patient Details Name: [name] MRN: [id number] DOB: May 25, 1950 Today's Date: 02/23/2019    History of Present Illness Pt is a 68 y.o. male presenting to hospital 02/16/19 s/p fall onto R hip when walking without walker and possibly hit head.  Pt s/p IM fixation 11/2 secondary R intertrochanteric hip fx.  PMH includes Parkinson's, B DBS, bradycardia, PTSD, dysrhythmia, htn, sleep apnea, SOB, Dupuytren's contracture, h/o L-spine surgery.    PT Comments    Patient reports no pain during treatment today. He performs supine <.sit with max assist. He is able to sit EOB with max assist and then after 10 mins, he can sit with SBA. He needs max assist to be repositioned in the bed. He performs AAROM to BLE for strengthening exercises and reports no pain. He is left in bed with bed alarm, asking for ice chips and a coke. He is scheduled for a procedure today and is NPO. Patient was educated about this; he will continue to benefit from skilled PT to improve mobility and strength.   Follow Up Recommendations  SNF     Equipment Recommendations  3in1 (PT);Wheelchair (measurements PT);Wheelchair cushion (measurements PT)    Recommendations for Other Services       Precautions / Restrictions Precautions Precautions: Fall Restrictions Weight Bearing Restrictions: Yes RLE Weight Bearing: Weight bearing as tolerated    Mobility  Bed Mobility Overal bed mobility: Needs Assistance Bed Mobility: Supine to Sit;Sit to Supine;Rolling Rolling: Max assist   Supine to sit: Max assist Sit to supine: Max assist   General bed mobility comments: needs VC for secuencing  Transfers Overall transfer level: (unable to stand with max assist)                  Ambulation/Gait             General Gait Details: unable this date   Stairs             Wheelchair Mobility    Modified Rankin (Stroke Patients Only)       Balance Overall balance  assessment: Needs assistance Sitting-balance support: Bilateral upper extremity supported;Feet supported Sitting balance-Leahy Scale: Poor Sitting balance - Comments: pt initially max assist for sitting balance but with vc's and tactile cues improved to CGA to min assist with intermittent posterior or forward lean (mild R lean during session initially but improved with cueing) Postural control: Posterior lean;Right lateral lean                                  Cognition Arousal/Alertness: Awake/alert Behavior During Therapy: Flat affect Overall Cognitive Status: Within Functional Limits for tasks assessed                                        Exercises Total Joint Exercises Ankle Circles/Pumps: AAROM;Strengthening;Right;Left Heel Slides: AAROM;Strengthening;Right;Left;10 reps General Exercises - Lower Extremity Straight Leg Raises: AAROM;Strengthening;Right;Left;10 reps    General Comments        Pertinent Vitals/Pain Pain Assessment: No/denies pain Pain Score: 0-No pain    Home Living                      Prior Function            PT Goals (current goals can now be found in the care  plan section) Acute Rehab PT Goals Patient Stated Goal: to improve mobility PT Goal Formulation: With patient/family Time For Goal Achievement: 03/05/19 Potential to Achieve Goals: Good Progress towards PT goals: Progressing toward goals    Frequency    BID      PT Plan Current plan remains appropriate    Co-evaluation              AM-PAC PT "6 Clicks" Mobility   Outcome Measure  Help needed turning from your back to your side while in a flat bed without using bedrails?: Total Help needed moving from lying on your back to sitting on the side of a flat bed without using bedrails?: Total Help needed moving to and from a bed to a chair (including a wheelchair)?: Total Help needed standing up from a chair using your arms (e.g.,  wheelchair or bedside chair)?: Total Help needed to walk in hospital room?: Total Help needed climbing 3-5 steps with a railing? : Total 6 Click Score: 6    End of Session Equipment Utilized During Treatment: Gait belt Activity Tolerance: Patient limited by fatigue Patient left: in bed;with call bell/phone within reach;with bed alarm set;Other (comment);with SCD's reapplied(B heels floating via pillows) Nurse Communication: Mobility status;Precautions PT Visit Diagnosis: Other abnormalities of gait and mobility (R26.89);Muscle weakness (generalized) (M62.81);Repeated falls (R29.6);History of falling (Z91.81);Difficulty in walking, not elsewhere classified (R26.2);Pain Pain - Right/Left: Right Pain - part of body: Hip     Time: 0925-0950 PT Time Calculation (min) (ACUTE ONLY): 25 min  Charges:  $Therapeutic Exercise: 8-22 mins $Therapeutic Activity: 8-22 mins                        Alanson Puls, PT DPT 02/23/2019, 10:33 AM

## 2019-02-23 NOTE — Progress Notes (Addendum)
PROGRESS NOTE    [name]  [id number] DOB: 02/13/1951 DOA: 02/16/2019  PCP: Tonia Ghent, MD    LOS - 7   Brief Narrative:  69 year old with history of HTN,Parkinson'sdisease status postdeepbrain stimulator, bradycardia, PTSD, obstructive sleep apnea presented after mechanical fall with right hip fracture.Tolerated ORIF well on 11/2.Patient developed feveron afternoon of 11/4, Tmax 102.2.Covid re-tested and negative.CXR not conclusive, but possible infiltrate, started on Unasyn. Blood culture return with E.coli in 1 of 4 bottles.  ID consulted.  Suspect possible UTI, antibiotic changed to meropenem for empiric ESBL coverage pending culture sensitivities. Surgical site clean and without signs of infection.    Subjective 11/7: Patient seen, awake laying in bed.  No acute events reported overnight.  Patient a little more alert and interactive today compared to yesterday.  Denies significant pain or discomfort.  Reports he had a BM.  No other acute complaints.  Assessment & Plan:   Principal Problem:   E. coli bacteremia Active Problems:   Closed right hip fracture (HCC)   Aspiration pneumonia (HCC)   Dysphagia   Essential hypertension   Erectile dysfunction   HLD (hyperlipidemia)   Depression   GERD (gastroesophageal reflux disease)   Fever: intermittent, recurred today E. Coli Bacteremia Concern for Aspiration Pneumonia - covid-19negative (11/4) - chest xraywith diffuse interstitial prominence which could be edema or atypical infection, concern for aspiration - ID consulted - follow up culture sensitivities - continue meropenem for empiric ESBL pending culture - Tylenol PRN  Right hip fracture, closed status post fall -Status post ORIF 02/18/2019 -PT/OT -Pain control, bowel regimen -Lovenox for postop DVT prophylaxis. - agrees to SNF, accepted [facility]  Constipation - bowel regimen ordered, please give  - will add  Senna  Dysphagia - worsened with above acute illness -Seen by speech therapist, initially deemed mild aspiration risk and recommend dysphagia 3 diet. - Has had increased difficulty with swallowing and overt aspiration on repeat swallow evaluation 11/6.  Seems to have more difficulty when not feeling well, febrile etc.  When he is more alert and interactive he does better w swallow. - aspiration precautions - will resume dysphagia I (pureed) diet w nectar thickened liquids after discussion with SLP.  History of Parkinson's disease -Continue Sinemet  History of anxiety/depression -Continue Lexapro and Klonopin  GERD -PPI   DVT prophylaxis:Lovenox Code Status: Full Code Family Communication:wifeat bedside, updated today, 11/5 Disposition Plan:Now with bacteremia and recurrence of fever.  Awaiting culture sensitivities.  Hopefully to SNF on Monday if final cultures back by then.   Consultants:  ortho  Procedures:  ORIF 02/18/19  Antimicrobials:  Unasyn, start 11/4, stopped 11/6  Meropenem, start 11/6, stop TBD   Objective: Vitals:   02/22/19 0758 02/22/19 1500 02/22/19 2328 02/23/19 0752  BP: (!) 150/91  (!) 161/77 (!) 159/73  Pulse: (!) 108  (!) 57 (!) 54  Resp: 16  17   Temp: (!) 97.4 F (36.3 C) 97.9 F (36.6 C) 99.6 F (37.6 C) (!) 100.8 F (38.2 C)  TempSrc: Oral Axillary Oral Oral  SpO2: 94%  97% 95%  Weight:      Height:        Intake/Output Summary (Last 24 hours) at 02/23/2019 1459 Last data filed at 02/23/2019 0500 Gross per 24 hour  Intake 200 ml  Output 1150 ml  Net -950 ml   Filed Weights   02/16/19 1630 02/17/19 1959 02/18/19 1344  Weight: 83.9 kg 86.8 kg 86 kg    Examination:  General exam: awake, alert, no acute distress Respiratory system: clear to auscultation bilaterally, no wheezes, rales or rhonchi, normal respiratory effort. Cardiovascular system: normal S1/S2, RRR, no JVD, murmurs, rubs, gallops, no pedal  edema.   Gastrointestinal system: soft, non-tender, non-distended abdomen, no organomegaly or masses felt, normal bowel sounds. Central nervous system: alert and oriented x3. no gross focal neurologic deficits Extremities: moves all, no edema, normal tone, right hip with dressing intact Skin: dry, intact, normal temperature   Data Reviewed: I have personally reviewed following labs and imaging studies  CBC: Recent Labs  Lab 02/16/19 1640  02/19/19 0518 02/20/19 0544 02/21/19 0428 02/22/19 0518 02/23/19 0503  WBC 8.5   < > 9.4 10.2 14.5* 7.6 3.7*  NEUTROABS 6.2  --   --   --   --  6.0 2.3  HGB 14.6   < > 11.8* 11.8* 12.3* 10.5* 11.0*  HCT 42.8   < > 34.5* 33.9* 36.6* 31.4* 32.4*  MCV 89.4   < > 88.0 88.3 90.6 89.0 88.5  PLT 182   < > 141* 154 141* 148* 149*   < > = values in this interval not displayed.   Basic Metabolic Panel: Recent Labs  Lab 02/18/19 0333 02/19/19 0518 02/20/19 0544 02/21/19 0428 02/22/19 0518 02/23/19 0503  NA 139 136 136 137 135 137  K 3.9 3.3* 4.0 3.8 3.6 3.5  CL 105 104 105 104 104 105  CO2 26 24 21* 24 23 22   GLUCOSE 134* 153* 120* 130* 113* 98  BUN 21 19 19 21  24* 22  CREATININE 0.87 0.88 0.77 0.83 0.89 0.76  CALCIUM 8.4* 8.1* 7.9* 8.3* 7.9* 8.0*  MG 2.1 1.7 2.0 2.4 2.2  --    GFR: Estimated Creatinine Clearance: 94.7 mL/min (by C-G formula based on SCr of 0.76 mg/dL). Liver Function Tests: No results for input(s): AST, ALT, ALKPHOS, BILITOT, PROT, ALBUMIN in the last 168 hours. No results for input(s): LIPASE, AMYLASE in the last 168 hours. No results for input(s): AMMONIA in the last 168 hours. Coagulation Profile: Recent Labs  Lab 02/16/19 1640  INR 1.1   Cardiac Enzymes: No results for input(s): CKTOTAL, CKMB, CKMBINDEX, TROPONINI in the last 168 hours. BNP (last 3 results) No results for input(s): PROBNP in the last 8760 hours. HbA1C: No results for input(s): HGBA1C in the last 72 hours. CBG: No results for input(s): GLUCAP  in the last 168 hours. Lipid Profile: No results for input(s): CHOL, HDL, LDLCALC, TRIG, CHOLHDL, LDLDIRECT in the last 72 hours. Thyroid Function Tests: No results for input(s): TSH, T4TOTAL, FREET4, T3FREE, THYROIDAB in the last 72 hours. Anemia Panel: No results for input(s): VITAMINB12, FOLATE, FERRITIN, TIBC, IRON, RETICCTPCT in the last 72 hours. Sepsis Labs: No results for input(s): PROCALCITON, LATICACIDVEN in the last 168 hours.  Recent Results (from the past 240 hour(s))  SARS Coronavirus 2 by RT PCR (hospital order, performed in [facility] hospital lab) Nasopharyngeal Nasopharyngeal Swab     Status: None   Collection Time: 02/16/19  4:40 PM   Specimen: Nasopharyngeal Swab  Result Value Ref Range Status   SARS Coronavirus 2 NEGATIVE NEGATIVE Final    Comment: (NOTE) If result is NEGATIVE SARS-CoV-2 target nucleic acids are NOT DETECTED. The SARS-CoV-2 RNA is generally detectable in upper and lower  respiratory specimens during the acute phase of infection. The lowest  concentration of SARS-CoV-2 viral copies this assay can detect is 250  copies / mL. A negative result does not preclude SARS-CoV-2 infection  and should not be used as the sole basis for treatment or other  patient management decisions.  A negative result may occur with  improper specimen collection / handling, submission of specimen other  than nasopharyngeal swab, presence of viral mutation(s) within the  areas targeted by this assay, and inadequate number of viral copies  (<250 copies / mL). A negative result must be combined with clinical  observations, patient history, and epidemiological information. If result is POSITIVE SARS-CoV-2 target nucleic acids are DETECTED. The SARS-CoV-2 RNA is generally detectable in upper and lower  respiratory specimens dur ing the acute phase of infection.  Positive  results are indicative of active infection with SARS-CoV-2.  Clinical  correlation with patient history  and other diagnostic information is  necessary to determine patient infection status.  Positive results do  not rule out bacterial infection or co-infection with other viruses. If result is PRESUMPTIVE POSTIVE SARS-CoV-2 nucleic acids MAY BE PRESENT.   A presumptive positive result was obtained on the submitted specimen  and confirmed on repeat testing.  While 2019 novel coronavirus  (SARS-CoV-2) nucleic acids may be present in the submitted sample  additional confirmatory testing may be necessary for epidemiological  and / or clinical management purposes  to differentiate between  SARS-CoV-2 and other Sarbecovirus currently known to infect humans.  If clinically indicated additional testing with an alternate test  methodology [phone]) is advised. The SARS-CoV-2 RNA is generally  detectable in upper and lower respiratory sp ecimens during the acute  phase of infection. The expected result is Negative. Fact Sheet for Patients:  StrictlyIdeas.no Fact Sheet for Healthcare Providers: BankingDealers.co.za This test is not yet approved or cleared by the [place] FDA and has been authorized for detection and/or diagnosis of SARS-CoV-2 by FDA under an Emergency Use Authorization (EUA).  This EUA will remain in effect (meaning this test can be used) for the duration of the COVID-19 declaration under Section 564(b)(1) of the Act, 21 U.S.C. section 360bbb-3(b)(1), unless the authorization is terminated or revoked sooner. Performed at [facility], [address], [place], [place] [address]   Surgical pcr screen     Status: None   Collection Time: 02/17/19  8:27 PM   Specimen: Nasal Mucosa; Nasal Swab  Result Value Ref Range Status   MRSA, PCR NEGATIVE NEGATIVE Final   Staphylococcus aureus NEGATIVE NEGATIVE Final    Comment: (NOTE) The Xpert SA Assay (FDA approved for NASAL specimens in patients 58 years of age and older), is  one component of a comprehensive surveillance program. It is not intended to diagnose infection nor to guide or monitor treatment. Performed at [facility], [place], [place] [address]   SARS CORONAVIRUS 2 (TAT 6-24 HRS) Nasopharyngeal Nasopharyngeal Swab     Status: None   Collection Time: 02/20/19  2:45 PM   Specimen: Nasopharyngeal Swab  Result Value Ref Range Status   SARS Coronavirus 2 NEGATIVE NEGATIVE Final    Comment: (NOTE) SARS-CoV-2 target nucleic acids are NOT DETECTED. The SARS-CoV-2 RNA is generally detectable in upper and lower respiratory specimens during the acute phase of infection. Negative results do not preclude SARS-CoV-2 infection, do not rule out co-infections with other pathogens, and should not be used as the sole basis for treatment or other patient management decisions. Negative results must be combined with clinical observations, patient history, and epidemiological information. The expected result is Negative. Fact Sheet for Patients: SugarRoll.be Fact Sheet for Healthcare Providers: https://www.woods-mathews.com/ This test is  not yet approved or cleared by the [place] and  has been authorized for detection and/or diagnosis of SARS-CoV-2 by FDA under an Emergency Use Authorization (EUA). This EUA will remain  in effect (meaning this test can be used) for the duration of the COVID-19 declaration under Section 56 4(b)(1) of the Act, 21 U.S.C. section 360bbb-3(b)(1), unless the authorization is terminated or revoked sooner. Performed at [facility], [place] [address], [place], [place] [address]   CULTURE, BLOOD (ROUTINE X 2) w Reflex to ID Panel     Status: None (Preliminary result)   Collection Time: 02/20/19  7:40 PM   Specimen: BLOOD  Result Value Ref Range Status   Specimen Description   Final    BLOOD Blood Culture results may not be optimal due to an inadequate  volume of blood received in culture bottles   Special Requests BOTTLES DRAWN AEROBIC ONLY RIGHT ANTECUBITAL  Final   Culture   Final    NO GROWTH < 12 HOURS Performed at [facility], [address] [place], [place] [address]    Report Status PENDING  Incomplete  CULTURE, BLOOD (ROUTINE X 2) w Reflex to ID Panel     Status: Abnormal (Preliminary result)   Collection Time: 02/20/19  8:49 PM   Specimen: BLOOD  Result Value Ref Range Status   Specimen Description   Final    BLOOD LEFT HAND Performed at [facility], [address], [place], [place] [address]    Special Requests   Final    BOTTLES DRAWN AEROBIC AND ANAEROBIC Blood Culture adequate volume Performed at [facility], [address], [place], [place] [address]    Culture  Setup Time   Final    GRAM NEGATIVE RODS ANAEROBIC BOTTLE ONLY CRITICAL RESULT CALLED TO, READ BACK BY AND VERIFIED WITH: [facility] DUNCAN AT [id number] 02/22/2019.PMF    Culture (A)  Final    ESCHERICHIA COLI SUSCEPTIBILITIES TO FOLLOW Performed at [facility], [place] [address], [place], [place] [address]    Report Status PENDING  Incomplete  Blood Culture ID Panel (Reflexed)     Status: Abnormal   Collection Time: 02/20/19  8:49 PM  Result Value Ref Range Status   Enterococcus species NOT DETECTED NOT DETECTED Final   Listeria monocytogenes NOT DETECTED NOT DETECTED Final   Staphylococcus species NOT DETECTED NOT DETECTED Final   Staphylococcus aureus (BCID) NOT DETECTED NOT DETECTED Final   Streptococcus species NOT DETECTED NOT DETECTED Final   Streptococcus agalactiae NOT DETECTED NOT DETECTED Final   Streptococcus pneumoniae NOT DETECTED NOT DETECTED Final   Streptococcus pyogenes NOT DETECTED NOT DETECTED Final   Acinetobacter baumannii NOT DETECTED NOT DETECTED Final   Enterobacteriaceae species DETECTED (A) NOT DETECTED Final    Comment: Enterobacteriaceae represent a large family of gram-negative bacteria, not a  single organism. CRITICAL RESULT CALLED TO, READ BACK BY AND VERIFIED WITH: [facility] DUNCAN AT [id number] 02/22/2019.PMF    Enterobacter cloacae complex NOT DETECTED NOT DETECTED Final   Escherichia coli DETECTED (A) NOT DETECTED Final    Comment: CRITICAL RESULT CALLED TO, READ BACK BY AND VERIFIED WITH: [facility] DUNCAN AT [id number] 02/22/2019.PMF    Klebsiella oxytoca NOT DETECTED NOT DETECTED Final   Klebsiella pneumoniae NOT DETECTED NOT DETECTED Final   Proteus species NOT DETECTED NOT DETECTED Final   Serratia marcescens NOT DETECTED NOT DETECTED Final   Carbapenem resistance NOT DETECTED NOT DETECTED Final   Haemophilus influenzae NOT DETECTED NOT  DETECTED Final   Neisseria meningitidis NOT DETECTED NOT DETECTED Final   Pseudomonas aeruginosa NOT DETECTED NOT DETECTED Final   Candida albicans NOT DETECTED NOT DETECTED Final   Candida glabrata NOT DETECTED NOT DETECTED Final   Candida krusei NOT DETECTED NOT DETECTED Final   Candida parapsilosis NOT DETECTED NOT DETECTED Final   Candida tropicalis NOT DETECTED NOT DETECTED Final    Comment: Performed at [facility], [address], [place], [place] [address]         Radiology Studies: No results found.      Scheduled Meds:  carbidopa-levodopa  1 tablet Oral BID AC   carbidopa-levodopa  2 tablet Oral Q breakfast   cholecalciferol  1,000 Units Oral Daily   docusate sodium  100 mg Oral BID   enoxaparin (LOVENOX) injection  40 mg Subcutaneous Q24H   escitalopram  10 mg Oral Daily   Melatonin  10 mg Oral QHS   memantine  10 mg Oral BID   pantoprazole  40 mg Oral Daily   traMADol  50 mg Oral Q6H   Continuous Infusions:  meropenem (MERREM) IV 1 g (02/23/19 1422)   methocarbamol (ROBAXIN) IV       LOS: 7 days    Time spent: 25-30 minutes    Ezekiel Slocumb, DO [facility] Pager: [phone]  If 7PM-7AM, please contact night-coverage [email or web address] Password [facility] 02/23/2019, 2:59 PM

## 2019-02-23 NOTE — Progress Notes (Signed)
Physical Therapy Treatment Patient Details Name: [name] MRN: [id number] DOB: [birth year] Today's Date: 02/23/2019    History of Present Illness Pt is a 68 y.o. male presenting to hospital 02/16/19 s/p fall onto R hip when walking without walker and possibly hit head.  Pt s/p IM fixation 11/2 secondary R intertrochanteric hip fx.  PMH includes Parkinson's, B DBS, bradycardia, PTSD, dysrhythmia, htn, sleep apnea, SOB, Dupuytren's contracture, h/o L-spine surgery.    PT Comments    Pt in bed, awaiting barium swallow per wife.  ,Participated in exercises as described below.  Pt lethargic at start of session but awakens with time.  OOB deferred awaiting testing.   Follow Up Recommendations  SNF     Equipment Recommendations  3in1 (PT);Wheelchair (measurements PT);Wheelchair cushion (measurements PT)    Recommendations for Other Services       Precautions / Restrictions Precautions Precautions: Fall Restrictions Weight Bearing Restrictions: Yes RLE Weight Bearing: Weight bearing as tolerated    Mobility  Bed Mobility Overal bed mobility: Needs Assistance Bed Mobility: Supine to Sit;Sit to Supine;Rolling Rolling: Max assist   Supine to sit: Max assist Sit to supine: Max assist   General bed mobility comments: deferred- awaiting barium swallow  Transfers Overall transfer level: (unable to stand with max assist)                  Ambulation/Gait             General Gait Details: unable this date   Stairs             Wheelchair Mobility    Modified Rankin (Stroke Patients Only)       Balance Overall balance assessment: Needs assistance Sitting-balance support: Bilateral upper extremity supported;Feet supported Sitting balance-Leahy Scale: Poor Sitting balance - Comments: pt initially max assist for sitting balance but with vc's and tactile cues improved to CGA to min assist with intermittent posterior or forward lean (mild R lean during  session initially but improved with cueing) Postural control: Posterior lean;Right lateral lean                                  Cognition Arousal/Alertness: Awake/alert Behavior During Therapy: Flat affect Overall Cognitive Status: Within Functional Limits for tasks assessed                                        Exercises Total Joint Exercises Ankle Circles/Pumps: AAROM;Strengthening;Right;Left Heel Slides: AAROM;Strengthening;Right;Left;10 reps General Exercises - Lower Extremity Straight Leg Raises: AAROM;Strengthening;Right;Left;10 reps Other Exercises Other Exercises: supine AAROM BLE ankle pumps, heel slides, ab/add and slr x 20    General Comments        Pertinent Vitals/Pain Pain Assessment: No/denies pain Pain Score: 0-No pain    Home Living                      Prior Function            PT Goals (current goals can now be found in the care plan section) Acute Rehab PT Goals Patient Stated Goal: to improve mobility PT Goal Formulation: With patient/family Time For Goal Achievement: 03/05/19 Potential to Achieve Goals: Good Progress towards PT goals: Progressing toward goals    Frequency    BID      PT Plan Current plan  remains appropriate    Co-evaluation              AM-PAC PT "6 Clicks" Mobility   Outcome Measure  Help needed turning from your back to your side while in a flat bed without using bedrails?: Total Help needed moving from lying on your back to sitting on the side of a flat bed without using bedrails?: Total Help needed moving to and from a bed to a chair (including a wheelchair)?: Total Help needed standing up from a chair using your arms (e.g., wheelchair or bedside chair)?: Total Help needed to walk in hospital room?: Total Help needed climbing 3-5 steps with a railing? : Total 6 Click Score: 6    End of Session Equipment Utilized During Treatment: Gait belt Activity Tolerance:  Patient tolerated treatment well Patient left: in bed;with call bell/phone within reach;with bed alarm set;with family/visitor present Nurse Communication: Mobility status;Precautions PT Visit Diagnosis: Other abnormalities of gait and mobility (R26.89);Muscle weakness (generalized) (M62.81);Repeated falls (R29.6);History of falling (Z91.81);Difficulty in walking, not elsewhere classified (R26.2);Pain Pain - Right/Left: Right Pain - part of body: Hip     Time: 1210-1220 PT Time Calculation (min) (ACUTE ONLY): 10 min  Charges:  $Therapeutic Exercise: 8-22 mins $Therapeutic Activity: 8-22 mins                    Chesley Noon, PTA 02/23/19, 12:23 PM

## 2019-02-24 DIAGNOSIS — R410 Disorientation, unspecified: Secondary | ICD-10-CM

## 2019-02-24 DIAGNOSIS — W19XXXA Unspecified fall, initial encounter: Secondary | ICD-10-CM

## 2019-02-24 DIAGNOSIS — Z960 Presence of urogenital implants: Secondary | ICD-10-CM

## 2019-02-24 LAB — CBC WITH DIFFERENTIAL/PLATELET
Abs Immature Granulocytes: 0.09 10*3/uL — ABNORMAL HIGH (ref 0.00–0.07)
Basophils Absolute: 0.1 10*3/uL (ref 0.0–0.1)
Basophils Relative: 1 %
Eosinophils Absolute: 0.2 10*3/uL (ref 0.0–0.5)
Eosinophils Relative: 3 %
HCT: 32.6 % — ABNORMAL LOW (ref 39.0–52.0)
Hemoglobin: 11.4 g/dL — ABNORMAL LOW (ref 13.0–17.0)
Immature Granulocytes: 2 %
Lymphocytes Relative: 23 %
Lymphs Abs: 1.3 10*3/uL (ref 0.7–4.0)
MCH: 30.2 pg (ref 26.0–34.0)
MCHC: 35 g/dL (ref 30.0–36.0)
MCV: 86.5 fL (ref 80.0–100.0)
Monocytes Absolute: 1 10*3/uL (ref 0.1–1.0)
Monocytes Relative: 18 %
Neutro Abs: 3 10*3/uL (ref 1.7–7.7)
Neutrophils Relative %: 53 %
Platelets: 179 10*3/uL (ref 150–400)
RBC: 3.77 MIL/uL — ABNORMAL LOW (ref 4.22–5.81)
RDW: 13.1 % (ref 11.5–15.5)
WBC: 5.6 10*3/uL (ref 4.0–10.5)
nRBC: 0 % (ref 0.0–0.2)

## 2019-02-24 LAB — BASIC METABOLIC PANEL
Anion gap: 10 (ref 5–15)
BUN: 24 mg/dL — ABNORMAL HIGH (ref 8–23)
CO2: 23 mmol/L (ref 22–32)
Calcium: 8.1 mg/dL — ABNORMAL LOW (ref 8.9–10.3)
Chloride: 103 mmol/L (ref 98–111)
Creatinine, Ser: 0.79 mg/dL (ref 0.61–1.24)
GFR calc Af Amer: >60 mL/min (ref >60)
GFR calc non Af Amer: >60 mL/min (ref >60)
Glucose, Bld: 104 mg/dL — ABNORMAL HIGH (ref 70–99)
Potassium: 3.6 mmol/L (ref 3.5–5.1)
Sodium: 136 mmol/L (ref 135–145)

## 2019-02-24 LAB — CULTURE, BLOOD (ROUTINE X 2): Special Requests: ADEQUATE

## 2019-02-24 MED ORDER — POTASSIUM CHLORIDE 20 MEQ PO PACK
40.0000 meq | PACK | Freq: Once | ORAL | Status: AC
  Administered 2019-02-24: 16:00:00 40 meq via ORAL
  Filled 2019-02-24: qty 2

## 2019-02-24 MED ORDER — SODIUM CHLORIDE 0.9 % IV SOLN
2.0000 g | INTRAVENOUS | Status: DC
  Administered 2019-02-24 – 2019-02-25 (×2): 2 g via INTRAVENOUS
  Filled 2019-02-24 (×2): qty 20
  Filled 2019-02-24: qty 2
  Filled 2019-02-24: qty 20

## 2019-02-24 NOTE — Progress Notes (Signed)
Physical Therapy Treatment Patient Details Name: [name] MRN: [id number] DOB: [birth year] Today's Date: 02/24/2019    History of Present Illness 68 y.o. male presenting to hospital 02/16/19 s/p fall onto R hip when walking without walker and possibly hit head.  Pt s/p IM fixation 11/2 secondary R intertrochanteric hip fx.  PMH includes Parkinson's, B DBS, bradycardia, PTSD, dysrhythmia, htn, sleep apnea, SOB, Dupuytren's contracture, h/o L-spine surgery.    PT Comments    Pt was able to be assisted to side of bed, and bed was changed while he stood with PT at [name] stepped in to try two more standing efforts before returning to bed.  Pt is expected to go to SNF and will likely need RW and WC for follow up at home.  It is not clear that pt is going to be functionally ambulatory after rehab to this therapist.   Follow Up Recommendations  SNF     Equipment Recommendations  3in1 (PT);Wheelchair (measurements PT);Wheelchair cushion (measurements PT)    Recommendations for Other Services OT consult     Precautions / Restrictions Precautions Precautions: Fall Precaution Comments: pt has removed his cath when PT arrived Restrictions Weight Bearing Restrictions: Yes RLE Weight Bearing: Weight bearing as tolerated    Mobility  Bed Mobility Overal bed mobility: Needs Assistance Bed Mobility: Supine to Sit;Sit to Supine Rolling: Max assist   Supine to sit: Max assist Sit to supine: Max assist      Transfers Overall transfer level: Needs assistance Equipment used: Rolling walker (2 wheeled);2 person hand held assist Transfers: Sit to/from Stand Sit to Stand: Max assist;+2 physical assistance;+2 safety/equipment         General transfer comment: stood x 3  Ambulation/Gait                 Stairs             Wheelchair Mobility    Modified Rankin (Stroke Patients Only)       Balance     Sitting balance-Leahy Scale: Fair       Standing  balance-Leahy Scale: Poor                              Cognition Arousal/Alertness: Awake/alert Behavior During Therapy: Flat affect Overall Cognitive Status: No family/caregiver present to determine baseline cognitive functioning Area of Impairment: Problem solving;Awareness;Safety/judgement;Following commands                       Following Commands: Follows one step commands inconsistently;Follows one step commands with increased time Safety/Judgement: Decreased awareness of safety;Decreased awareness of deficits Awareness: Intellectual Problem Solving: Slow processing;Requires verbal cues General Comments: pt was a bit confused and had removed his cath, nursing arrived to help PT stand him and work on getting pt dried       Exercises      General Comments General comments (skin integrity, edema, etc.): pt was weak and resistant to standing with RW, but could do some work to [name] on RLE and control standing balance somewhat      Pertinent Vitals/Pain Pain Assessment: Faces Pain Score: 0-No pain    Home Living                      Prior Function            PT Goals (current goals can now be found in the care  plan section) Acute Rehab PT Goals Patient Stated Goal: to change wet gown    Frequency    BID      PT Plan Current plan remains appropriate    Co-evaluation              AM-PAC PT "6 Clicks" Mobility   Outcome Measure  Help needed turning from your back to your side while in a flat bed without using bedrails?: Total Help needed moving from lying on your back to sitting on the side of a flat bed without using bedrails?: Total Help needed moving to and from a bed to a chair (including a wheelchair)?: Total Help needed standing up from a chair using your arms (e.g., wheelchair or bedside chair)?: Total Help needed to walk in hospital room?: Total Help needed climbing 3-5 steps with a railing? : Total 6 Click Score: 6     End of Session Equipment Utilized During Treatment: Gait belt Activity Tolerance: Patient limited by fatigue Patient left: in bed;with call bell/phone within reach;with bed alarm set Nurse Communication: Mobility status PT Visit Diagnosis: Muscle weakness (generalized) (M62.81);Difficulty in walking, not elsewhere classified (R26.2) Pain - Right/Left: Right Pain - part of body: Hip     Time: [id number] PT Time Calculation (min) (ACUTE ONLY): 23 min  Charges:  $Therapeutic Activity: 23-37 mins                 Ramond Dial 02/24/2019, 5:54 PM   Mee Hives, PT MS Acute Rehab Dept. Number: [name] [facility] [name]

## 2019-02-24 NOTE — Progress Notes (Signed)
   Date of Admission:  02/16/2019     Subjective: Pt is awake and alert but is confused-  No fever > 30 hrs  Medications:  . carbidopa-levodopa  1 tablet Oral BID AC  . carbidopa-levodopa  2 tablet Oral Q breakfast  . cholecalciferol  1,000 Units Oral Daily  . docusate sodium  100 mg Oral BID  . enoxaparin (LOVENOX) injection  40 mg Subcutaneous Q24H  . escitalopram  10 mg Oral Daily  . Melatonin  10 mg Oral QHS  . memantine  10 mg Oral BID  . pantoprazole  40 mg Oral Daily  . traMADol  50 mg Oral Q6H    Objective: Vital signs in last 24 hours: Temp:  [97.8 F (36.6 C)-98.5 F (36.9 C)] 98.5 F (36.9 C) (11/07 2341) Pulse Rate:  [49] 49 (11/07 2341) Resp:  [18-20] 18 (11/07 2341) BP: (120-124)/(70-89) 120/89 (11/07 2341) SpO2:  [97 %-100 %] 100 % (11/07 2341)  PHYSICAL EXAM:  General: Awake , cooperative, no distress, confused- thinks people are around him Lungs: b/l air entry Heart: S1-S2 Stimulators bilaterally on both sides of the chest wall.  Abdomen: Soft, non-tender,not distended. Bowel sounds normal. No masses External Foley Good urine output Extremities: Right hip surgical site covered with dressing Skin: No rashes or lesions. Or bruising Lymph: Cervical, supraclavicular normal. Neurologic: moves all limbs Lab Results Recent Labs    02/23/19 0503 02/24/19 0540  WBC 3.7* 5.6  HGB 11.0* 11.4*  HCT 32.4* 32.6*  NA 137 136  K 3.5 3.6  CL 105 103  CO2 22 23  BUN 22 24*  CREATININE 0.76 0.79   Microbiology: 02/20/19 BC- e.coli bacteremia 11/6 NG   Assessment/Plan: 68 year old male with history of Parkinson's disease with bilateral thalamic stimulators.  E. coli bacteremia likely due to a urinary tract infection.Currently on meropenem day 3..  The bacteria is susceptible to ceftriaxone.  Will change to ceftriaxone today.  On discharge tomorrow he could be sent on Bactrim DS p.o. twice daily for 5 more days.  Fall with fracture right intertrochanteric  region.  Status post ORIF on 02/18/2019.  Parkinson's disease on carbidopa and levodopa.  Also has bilateral thalamic stimulators.  Confusion: Wonder whether he has underlying neurocognitive impairment.  Discussed with the primary Care team  ID will sign off.  Call if needed.

## 2019-02-24 NOTE — Progress Notes (Signed)
PROGRESS NOTE    [name]  [id number] DOB: 05/25/1950 DOA: 02/16/2019  PCP: Tonia Ghent, MD    LOS - 8   Brief Narrative:  68 year old with history of HTN,Parkinson'sdisease status postdeepbrain stimulator, bradycardia, PTSD, obstructive sleep apnea presented after mechanical fall with right hip fracture.Tolerated ORIF well on 11/2.Patient developed feveron afternoon of 11/4, Tmax 102.2.Covid re-tested and negative.CXR not conclusive, but possible infiltrate, started on Unasyn. Blood culturereturn with E.coli in 1 of 4 bottles.ID consulted. Antibiotic changed to meropenem for empiric ESBL coverage pending culture sensitivities.Surgical site clean and without signs of infection.    Subjective 11/8: Patient seen and examined, awake sitting up in bed, wife at bedside.  He is more alert and talkative today.  Wife reports patient ate some of his breakfast without signs of aspiration.  Patient denies pain at rest but significant pain with moving his right leg.  No acute events reported overnight.  Assessment & Plan:   Principal Problem:   E. coli bacteremia Active Problems:   Closed right hip fracture (HCC)   Aspiration pneumonia (HCC)   Dysphagia   Essential hypertension   Erectile dysfunction   HLD (hyperlipidemia)   Depression   GERD (gastroesophageal reflux disease)   Fever:intermittent, currently afebrile E. Coli Bacteremia: Concern for Aspiration Pneumonia - covid-19negative (11/4) - chest xraywith diffuse interstitial prominence which could be edema or atypical infection, concern for aspiration - ID consulted - follow upculture sensitivities - continue meropenem for empiric ESBL pending culture - Tylenol PRN  Right hip fracture, closed status post fall -Status post ORIF 02/18/2019 -PT/OT -Pain control, bowel regimen -Lovenox for postop DVT prophylaxis. - agrees to SNF, accepted [facility]  Constipation- bowel regimen  ordered, please give  - will add Senna  Dysphagia - worsened with above acute illness -Seen by speech therapist, initially deemed mild aspiration risk and recommend dysphagia 3 diet. - Has had increased difficulty with swallowing and overt aspiration on repeat swallow evaluation 11/6.  Seems to have more difficulty when not feeling well, febrile etc.  When he is more alert and interactive he does better w swallow. - aspiration precautions - will resume dysphagia I (pureed) diet w nectar thickened liquids after discussion with SLP.  History of Parkinson's disease -Continue Sinemet  History of anxiety/depression -Continue Lexapro and Klonopin  GERD -PPI   DVT prophylaxis:Lovenox Code Status: Full Code Family Communication:wifeat bedside, updated today, 11/5 Disposition Plan:Now with bacteremia and recurrence of fever. Awaiting culture sensitivities.  Hopefully to SNF on Monday if final cultures back by then.   Consultants:  ortho  Procedures:  ORIF 02/18/19  Antimicrobials:  Unasyn, start 11/4,stopped 11/6  Meropenem, start 11/6, stop TBD    Objective: Vitals:   02/22/19 2328 02/23/19 0752 02/23/19 1646 02/23/19 2341  BP: (!) 161/77 (!) 159/73 124/70 120/89  Pulse: (!) 57 (!) 54 (!) 49 (!) 49  Resp: 17  20 18   Temp: 99.6 F (37.6 C) (!) 100.8 F (38.2 C) 97.8 F (36.6 C) 98.5 F (36.9 C)  TempSrc: Oral Oral Oral   SpO2: 97% 95% 97% 100%  Weight:      Height:        Intake/Output Summary (Last 24 hours) at 02/24/2019 1507 Last data filed at 02/24/2019 0912 Gross per 24 hour  Intake 120 ml  Output --  Net 120 ml   Filed Weights   02/16/19 1630 02/17/19 1959 02/18/19 1344  Weight: 83.9 kg 86.8 kg 86 kg    Examination:  General  exam: awake, alert, no acute distress HEENT: moist mucus membranes, hearing grossly normal  Respiratory system: clear to auscultation bilaterally, decreased breath sounds at the bases, no wheezes, rales or  rhonchi, normal respiratory effort. Cardiovascular system: normal S1/S2, RRR, no JVD, murmurs, rubs, gallops, no pedal edema.   Gastrointestinal system: soft, non-tender, non-distended abdomen, no organomegaly or masses felt, normal bowel sounds. Central nervous system: alert and oriented x3. no gross focal neurologic deficits, normal speech Extremities: no edema, normal tone, right hip surgical site without surrounding erythema warmth, dressing intact Skin: dry, intact, normal temperature Psychiatry: normal mood, flat affect    Data Reviewed: I have personally reviewed following labs and imaging studies  CBC: Recent Labs  Lab 02/20/19 0544 02/21/19 0428 02/22/19 0518 02/23/19 0503 02/24/19 0540  WBC 10.2 14.5* 7.6 3.7* 5.6  NEUTROABS  --   --  6.0 2.3 3.0  HGB 11.8* 12.3* 10.5* 11.0* 11.4*  HCT 33.9* 36.6* 31.4* 32.4* 32.6*  MCV 88.3 90.6 89.0 88.5 86.5  PLT 154 141* 148* 149* 0000000   Basic Metabolic Panel: Recent Labs  Lab 02/18/19 0333 02/19/19 0518 02/20/19 0544 02/21/19 0428 02/22/19 0518 02/23/19 0503 02/24/19 0540  NA 139 136 136 137 135 137 136  K 3.9 3.3* 4.0 3.8 3.6 3.5 3.6  CL 105 104 105 104 104 105 103  CO2 26 24 21* 24 23 22 23   GLUCOSE 134* 153* 120* 130* 113* 98 104*  BUN 21 19 19 21  24* 22 24*  CREATININE 0.87 0.88 0.77 0.83 0.89 0.76 0.79  CALCIUM 8.4* 8.1* 7.9* 8.3* 7.9* 8.0* 8.1*  MG 2.1 1.7 2.0 2.4 2.2  --   --    GFR: Estimated Creatinine Clearance: 94.7 mL/min (by C-G formula based on SCr of 0.79 mg/dL). Liver Function Tests: No results for input(s): AST, ALT, ALKPHOS, BILITOT, PROT, ALBUMIN in the last 168 hours. No results for input(s): LIPASE, AMYLASE in the last 168 hours. No results for input(s): AMMONIA in the last 168 hours. Coagulation Profile: No results for input(s): INR, PROTIME in the last 168 hours. Cardiac Enzymes: No results for input(s): CKTOTAL, CKMB, CKMBINDEX, TROPONINI in the last 168 hours. BNP (last 3 results) No  results for input(s): PROBNP in the last 8760 hours. HbA1C: No results for input(s): HGBA1C in the last 72 hours. CBG: No results for input(s): GLUCAP in the last 168 hours. Lipid Profile: No results for input(s): CHOL, HDL, LDLCALC, TRIG, CHOLHDL, LDLDIRECT in the last 72 hours. Thyroid Function Tests: No results for input(s): TSH, T4TOTAL, FREET4, T3FREE, THYROIDAB in the last 72 hours. Anemia Panel: No results for input(s): VITAMINB12, FOLATE, FERRITIN, TIBC, IRON, RETICCTPCT in the last 72 hours. Sepsis Labs: No results for input(s): PROCALCITON, LATICACIDVEN in the last 168 hours.  Recent Results (from the past 240 hour(s))  SARS Coronavirus 2 by RT PCR (hospital order, performed in [facility] hospital lab) Nasopharyngeal Nasopharyngeal Swab     Status: None   Collection Time: 02/16/19  4:40 PM   Specimen: Nasopharyngeal Swab  Result Value Ref Range Status   SARS Coronavirus 2 NEGATIVE NEGATIVE Final    Comment: (NOTE) If result is NEGATIVE SARS-CoV-2 target nucleic acids are NOT DETECTED. The SARS-CoV-2 RNA is generally detectable in upper and lower  respiratory specimens during the acute phase of infection. The lowest  concentration of SARS-CoV-2 viral copies this assay can detect is 250  copies / mL. A negative result does not preclude SARS-CoV-2 infection  and should not be used as the  sole basis for treatment or other  patient management decisions.  A negative result may occur with  improper specimen collection / handling, submission of specimen other  than nasopharyngeal swab, presence of viral mutation(s) within the  areas targeted by this assay, and inadequate number of viral copies  (<250 copies / mL). A negative result must be combined with clinical  observations, patient history, and epidemiological information. If result is POSITIVE SARS-CoV-2 target nucleic acids are DETECTED. The SARS-CoV-2 RNA is generally detectable in upper and lower  respiratory specimens  dur ing the acute phase of infection.  Positive  results are indicative of active infection with SARS-CoV-2.  Clinical  correlation with patient history and other diagnostic information is  necessary to determine patient infection status.  Positive results do  not rule out bacterial infection or co-infection with other viruses. If result is PRESUMPTIVE POSTIVE SARS-CoV-2 nucleic acids MAY BE PRESENT.   A presumptive positive result was obtained on the submitted specimen  and confirmed on repeat testing.  While 2019 novel coronavirus  (SARS-CoV-2) nucleic acids may be present in the submitted sample  additional confirmatory testing may be necessary for epidemiological  and / or clinical management purposes  to differentiate between  SARS-CoV-2 and other Sarbecovirus currently known to infect humans.  If clinically indicated additional testing with an alternate test  methodology [phone]) is advised. The SARS-CoV-2 RNA is generally  detectable in upper and lower respiratory sp ecimens during the acute  phase of infection. The expected result is Negative. Fact Sheet for Patients:  StrictlyIdeas.no Fact Sheet for Healthcare Providers: BankingDealers.co.za This test is not yet approved or cleared by the [place] FDA and has been authorized for detection and/or diagnosis of SARS-CoV-2 by FDA under an Emergency Use Authorization (EUA).  This EUA will remain in effect (meaning this test can be used) for the duration of the COVID-19 declaration under Section 564(b)(1) of the Act, 21 U.S.C. section 360bbb-3(b)(1), unless the authorization is terminated or revoked sooner. Performed at [facility], [address], [place], [place] [address]   Surgical pcr screen     Status: None   Collection Time: 02/17/19  8:27 PM   Specimen: Nasal Mucosa; Nasal Swab  Result Value Ref Range Status   MRSA, PCR NEGATIVE NEGATIVE Final    Staphylococcus aureus NEGATIVE NEGATIVE Final    Comment: (NOTE) The Xpert SA Assay (FDA approved for NASAL specimens in patients 36 years of age and older), is one component of a comprehensive surveillance program. It is not intended to diagnose infection nor to guide or monitor treatment. Performed at [facility], [place], [place] [address]   SARS CORONAVIRUS 2 (TAT 6-24 HRS) Nasopharyngeal Nasopharyngeal Swab     Status: None   Collection Time: 02/20/19  2:45 PM   Specimen: Nasopharyngeal Swab  Result Value Ref Range Status   SARS Coronavirus 2 NEGATIVE NEGATIVE Final    Comment: (NOTE) SARS-CoV-2 target nucleic acids are NOT DETECTED. The SARS-CoV-2 RNA is generally detectable in upper and lower respiratory specimens during the acute phase of infection. Negative results do not preclude SARS-CoV-2 infection, do not rule out co-infections with other pathogens, and should not be used as the sole basis for treatment or other patient management decisions. Negative results must be combined with clinical observations, patient history, and epidemiological information. The expected result is Negative. Fact Sheet for Patients: SugarRoll.be Fact Sheet for Healthcare Providers: https://www.woods-mathews.com/ This test is not yet approved or cleared by the  [place] Architectural technologist and  has been authorized for detection and/or diagnosis of SARS-CoV-2 by FDA under an Print production planner (EUA). This EUA will remain  in effect (meaning this test can be used) for the duration of the COVID-19 declaration under Section 56 4(b)(1) of the Act, 21 U.S.C. section 360bbb-3(b)(1), unless the authorization is terminated or revoked sooner. Performed at [facility], [place] [address], [place], [place] [address]   CULTURE, BLOOD (ROUTINE X 2) w Reflex to ID Panel     Status: None (Preliminary result)   Collection Time: 02/20/19   7:40 PM   Specimen: BLOOD  Result Value Ref Range Status   Specimen Description   Final    BLOOD Blood Culture results may not be optimal due to an inadequate volume of blood received in culture bottles   Special Requests BOTTLES DRAWN AEROBIC ONLY RIGHT ANTECUBITAL  Final   Culture   Final    NO GROWTH 4 DAYS Performed at [facility], [address] [place], [place] [address]    Report Status PENDING  Incomplete  CULTURE, BLOOD (ROUTINE X 2) w Reflex to ID Panel     Status: Abnormal   Collection Time: 02/20/19  8:49 PM   Specimen: BLOOD  Result Value Ref Range Status   Specimen Description   Final    BLOOD LEFT HAND Performed at [facility], [address] [place], [place] [address]    Special Requests   Final    BOTTLES DRAWN AEROBIC AND ANAEROBIC Blood Culture adequate volume Performed at [facility], [address], [place], [place] [address]    Culture  Setup Time   Final    GRAM NEGATIVE RODS ANAEROBIC BOTTLE ONLY CRITICAL RESULT CALLED TO, READ BACK BY AND VERIFIED WITH: [facility] DUNCAN AT [id number] 02/22/2019.PMF Performed at [facility], [place] [address] [place], [place] [address]    Culture ESCHERICHIA COLI (A)  Final   Report Status 02/24/2019 FINAL  Final   Organism ID, Bacteria ESCHERICHIA COLI  Final      Susceptibility   Escherichia coli - MIC*    AMPICILLIN >=32 RESISTANT Resistant     CEFAZOLIN >=64 RESISTANT Resistant     CEFEPIME <=1 SENSITIVE Sensitive     CEFTAZIDIME <=1 SENSITIVE Sensitive     CEFTRIAXONE <=1 SENSITIVE Sensitive     CIPROFLOXACIN <=0.25 SENSITIVE Sensitive     GENTAMICIN <=1 SENSITIVE Sensitive     IMIPENEM <=0.25 SENSITIVE Sensitive     TRIMETH/SULFA <=20 SENSITIVE Sensitive     AMPICILLIN/SULBACTAM >=32 RESISTANT Resistant     PIP/TAZO 8 SENSITIVE Sensitive     Extended ESBL NEGATIVE Sensitive     * ESCHERICHIA COLI  Blood Culture ID Panel (Reflexed)     Status: Abnormal   Collection Time:  02/20/19  8:49 PM  Result Value Ref Range Status   Enterococcus species NOT DETECTED NOT DETECTED Final   Listeria monocytogenes NOT DETECTED NOT DETECTED Final   Staphylococcus species NOT DETECTED NOT DETECTED Final   Staphylococcus aureus (BCID) NOT DETECTED NOT DETECTED Final   Streptococcus species NOT DETECTED NOT DETECTED Final   Streptococcus agalactiae NOT DETECTED NOT DETECTED Final   Streptococcus pneumoniae NOT DETECTED NOT DETECTED Final   Streptococcus pyogenes NOT DETECTED NOT DETECTED Final   Acinetobacter baumannii NOT DETECTED NOT DETECTED Final   Enterobacteriaceae species DETECTED (A) NOT DETECTED Final    Comment: Enterobacteriaceae represent a large family of gram-negative bacteria, not a single organism.  CRITICAL RESULT CALLED TO, READ BACK BY AND VERIFIED WITH: [facility] DUNCAN AT [id number] 02/22/2019.PMF    Enterobacter cloacae complex NOT DETECTED NOT DETECTED Final   Escherichia coli DETECTED (A) NOT DETECTED Final    Comment: CRITICAL RESULT CALLED TO, READ BACK BY AND VERIFIED WITH: [facility] DUNCAN AT [id number] 02/22/2019.PMF    Klebsiella oxytoca NOT DETECTED NOT DETECTED Final   Klebsiella pneumoniae NOT DETECTED NOT DETECTED Final   Proteus species NOT DETECTED NOT DETECTED Final   Serratia marcescens NOT DETECTED NOT DETECTED Final   Carbapenem resistance NOT DETECTED NOT DETECTED Final   Haemophilus influenzae NOT DETECTED NOT DETECTED Final   Neisseria meningitidis NOT DETECTED NOT DETECTED Final   Pseudomonas aeruginosa NOT DETECTED NOT DETECTED Final   Candida albicans NOT DETECTED NOT DETECTED Final   Candida glabrata NOT DETECTED NOT DETECTED Final   Candida krusei NOT DETECTED NOT DETECTED Final   Candida parapsilosis NOT DETECTED NOT DETECTED Final   Candida tropicalis NOT DETECTED NOT DETECTED Final    Comment: Performed at [facility], [address] [place], [place] [address]  CULTURE, BLOOD (ROUTINE X 2) w Reflex to ID Panel     Status: None  (Preliminary result)   Collection Time: 02/22/19  2:50 PM   Specimen: BLOOD  Result Value Ref Range Status   Specimen Description BLOOD LEFT ANTECUBITAL  Final   Special Requests   Final    BOTTLES DRAWN AEROBIC AND ANAEROBIC Blood Culture adequate volume   Culture   Final    NO GROWTH 2 DAYS Performed at [facility], [address] [place], [place] [address]    Report Status PENDING  Incomplete  CULTURE, BLOOD (ROUTINE X 2) w Reflex to ID Panel     Status: None (Preliminary result)   Collection Time: 02/22/19  3:00 PM   Specimen: BLOOD  Result Value Ref Range Status   Specimen Description BLOOD BLOOD LEFT HAND  Final   Special Requests   Final    BOTTLES DRAWN AEROBIC AND ANAEROBIC Blood Culture adequate volume   Culture   Final    NO GROWTH 2 DAYS Performed at [facility], [address], [place], [place] [address]    Report Status PENDING  Incomplete         Radiology Studies: No results found.      Scheduled Meds:  carbidopa-levodopa  1 tablet Oral BID AC   carbidopa-levodopa  2 tablet Oral Q breakfast   cholecalciferol  1,000 Units Oral Daily   docusate sodium  100 mg Oral BID   enoxaparin (LOVENOX) injection  40 mg Subcutaneous Q24H   escitalopram  10 mg Oral Daily   Melatonin  10 mg Oral QHS   memantine  10 mg Oral BID   pantoprazole  40 mg Oral Daily   traMADol  50 mg Oral Q6H   Continuous Infusions:  meropenem (MERREM) IV 1 g (02/24/19 0604)   methocarbamol (ROBAXIN) IV       LOS: 8 days    Time spent: 25-30 min    Ezekiel Slocumb, DO [facility] Pager: [phone]  If 7PM-7AM, please contact night-coverage [email or web address] Password [facility] 02/24/2019, 3:07 PM

## 2019-02-25 ENCOUNTER — Ambulatory Visit: Payer: Medicare PPO

## 2019-02-25 ENCOUNTER — Encounter: Payer: Medicare PPO | Admitting: Occupational Therapy

## 2019-02-25 LAB — CULTURE, BLOOD (ROUTINE X 2): Culture: NO GROWTH

## 2019-02-25 LAB — BASIC METABOLIC PANEL
Anion gap: 8 (ref 5–15)
BUN: 20 mg/dL (ref 8–23)
CO2: 24 mmol/L (ref 22–32)
Calcium: 8.3 mg/dL — ABNORMAL LOW (ref 8.9–10.3)
Chloride: 106 mmol/L (ref 98–111)
Creatinine, Ser: 0.69 mg/dL (ref 0.61–1.24)
GFR calc Af Amer: >60 mL/min (ref >60)
GFR calc non Af Amer: >60 mL/min (ref >60)
Glucose, Bld: 106 mg/dL — ABNORMAL HIGH (ref 70–99)
Potassium: 3.8 mmol/L (ref 3.5–5.1)
Sodium: 138 mmol/L (ref 135–145)

## 2019-02-25 LAB — SARS CORONAVIRUS 2 (TAT 6-24 HRS): SARS Coronavirus 2: NEGATIVE

## 2019-02-25 NOTE — Care Management Important Message (Signed)
Important Message  Patient Details  Name: [name] MRN: [id number] Date of Birth: 01/30/1951   Medicare Important Message Given:  Yes     [name] 02/25/2019, 11:08 AM

## 2019-02-25 NOTE — Progress Notes (Signed)
Physical Therapy Treatment Patient Details Name: [name] MRN: [id number] DOB: 1950/06/15 Today's Date: 02/25/2019    History of Present Illness 68 y.o. male presenting to hospital 02/16/19 s/p fall onto R hip when walking without walker and possibly hit head.  Pt s/p IM fixation 11/2 secondary R intertrochanteric hip fx.  PMH includes Parkinson's, B DBS, bradycardia, PTSD, dysrhythmia, htn, sleep apnea, SOB, Dupuytren's contracture, h/o L-spine surgery.    PT Comments    Pt presented with deficits in strength, transfers, mobility, gait, balance, and activity tolerance.  Pt was +1 max A with bed mobility tasks this session and Mod A with transfers.  Pt continued to present with difficulty coming to full upright standing and was unable to lift his L foot from the floor but gave good effort throughout the session and is slowly progressing towards goals.  Pt will benefit from PT services in a SNF setting upon discharge to safely address above deficits for decreased caregiver assistance and eventual return to PLOF.    Follow Up Recommendations  SNF     Equipment Recommendations  Other (comment)(TBD at next venue of care)    Recommendations for Other Services       Precautions / Restrictions Precautions Precautions: Fall Restrictions Weight Bearing Restrictions: Yes RLE Weight Bearing: Weight bearing as tolerated    Mobility  Bed Mobility Overal bed mobility: Needs Assistance Bed Mobility: Supine to Sit;Sit to Supine Rolling: Max assist   Supine to sit: Max assist Sit to supine: Max assist   General bed mobility comments: Max A for RLE in and out of bed and for trunk control  Transfers Overall transfer level: Needs assistance Equipment used: (Pt holding onto edge of sink/counter top) Transfers: Sit to/from Stand Sit to Stand: Mod assist         General transfer comment: Sit to/from stand x 3  Ambulation/Gait             General Gait Details: Pt able to  perform limited standing marching with the RLE but unable to lift the L foot from the floor   Stairs             Wheelchair Mobility    Modified Rankin (Stroke Patients Only)       Balance Overall balance assessment: Needs assistance Sitting-balance support: Bilateral upper extremity supported;Feet supported Sitting balance-Leahy Scale: Fair Sitting balance - Comments: Min posterior and L lateral lean in sitting that the pt was able to self-correct with cues Postural control: Posterior lean;Left lateral lean Standing balance support: Bilateral upper extremity supported Standing balance-Leahy Scale: Poor Standing balance comment: Posterior instability in standing                            Cognition Arousal/Alertness: Awake/alert Behavior During Therapy: Flat affect Overall Cognitive Status: Impaired/Different from baseline Area of Impairment: Following commands;Awareness                       Following Commands: Follows one step commands inconsistently Safety/Judgement: Decreased awareness of safety;Decreased awareness of deficits Awareness: Intellectual Problem Solving: Slow processing;Requires verbal cues        Exercises Total Joint Exercises Ankle Circles/Pumps: AROM;Strengthening;Both;10 reps;15 reps Quad Sets: Strengthening;Both;5 reps;10 reps Towel Squeeze: Strengthening;5 reps;Both Short Arc Quad: Strengthening;Both;10 reps Heel Slides: AAROM;Strengthening;Both;5 reps Hip ABduction/ADduction: AAROM;Strengthening;Both;10 reps Straight Leg Raises: AAROM;Strengthening;Both;10 reps Long Arc Quad: AROM;Both;10 reps;Strengthening Marching in Standing: AROM;Right;5 reps;Standing Other Exercises Other Exercises: Anterior  weight shifting activities in sitting to facilitate improved transfers    General Comments        Pertinent Vitals/Pain Pain Assessment: No/denies pain    Home Living                      Prior Function             PT Goals (current goals can now be found in the care plan section) Progress towards PT goals: Progressing toward goals    Frequency    BID      PT Plan Current plan remains appropriate    Co-evaluation              AM-PAC PT "6 Clicks" Mobility   Outcome Measure  Help needed turning from your back to your side while in a flat bed without using bedrails?: A Lot Help needed moving from lying on your back to sitting on the side of a flat bed without using bedrails?: A Lot Help needed moving to and from a bed to a chair (including a wheelchair)?: Total Help needed standing up from a chair using your arms (e.g., wheelchair or bedside chair)?: A Lot Help needed to walk in hospital room?: Total Help needed climbing 3-5 steps with a railing? : Total 6 Click Score: 9    End of Session Equipment Utilized During Treatment: Gait belt Activity Tolerance: Patient tolerated treatment well Patient left: in bed;with call bell/phone within reach;with bed alarm set;with SCD's reapplied;Other (comment)(Pt left in L sidelying for turning protocol) Nurse Communication: Mobility status;Other (comment)(Redness to sacral/coccyx area) PT Visit Diagnosis: Muscle weakness (generalized) (M62.81);Difficulty in walking, not elsewhere classified (R26.2) Pain - Right/Left: Right Pain - part of body: Hip     Time: [id number] PT Time Calculation (min) (ACUTE ONLY): 24 min  Charges:  $Therapeutic Exercise: 8-22 mins $Therapeutic Activity: 8-22 mins                     D. Scott Talli Kimmer PT, DPT 02/25/19, 4:25 PM

## 2019-02-25 NOTE — Progress Notes (Signed)
  Subjective:  POD #7 s/p IM fixation for IT fracture.   Patient reports right hip pain as mild.  Patient making progress with PT.  His wife is at the bedside.   Anticipate discharge to SNF in next 24 hours per the patient's wife.  Objective:   VITALS:   Vitals:   02/23/19 2341 02/24/19 1609 02/24/19 2037 02/25/19 0423  BP: 120/89 (!) 142/73 (!) 150/69 (!) 150/78  Pulse: (!) 49 (!) 51 (!) 50 (!) 48  Resp: 18 18 20 19   Temp: 98.5 F (36.9 C) 98.6 F (37 C) 98.1 F (36.7 C) 97.6 F (36.4 C)  TempSrc:   Oral Oral  SpO2: 100% 95% 98% 98%  Weight:      Height:        PHYSICAL EXAM: Right lower extremity Neurovascular intact Sensation intact distally Intact pulses distally Dorsiflexion/Plantar flexion intact Incision: dressing C/D/I No cellulitis present Compartment soft  LABS  Results for orders placed or performed during the hospital encounter of 02/16/19 (from the past 24 hour(s))  Basic metabolic panel     Status: Abnormal   Collection Time: 02/25/19  5:04 AM  Result Value Ref Range   Sodium 138 135 - 145 mmol/L   Potassium 3.8 3.5 - 5.1 mmol/L   Chloride 106 98 - 111 mmol/L   CO2 24 22 - 32 mmol/L   Glucose, Bld 106 (H) 70 - 99 mg/dL   BUN 20 8 - 23 mg/dL   Creatinine, Ser 0.69 0.61 - 1.24 mg/dL   Calcium 8.3 (L) 8.9 - 10.3 mg/dL   GFR calc non Af Amer >60 >60 mL/min   GFR calc Af Amer >60 >60 mL/min   Anion gap 8 5 - 15    No results found.  Assessment/Plan: 7 Days Post-Op   Principal Problem:   E. coli bacteremia Active Problems:   Essential hypertension   Erectile dysfunction   HLD (hyperlipidemia)   Depression   GERD (gastroesophageal reflux disease)   Closed right hip fracture (HCC)   Aspiration pneumonia (HCC)   Dysphagia  Continue antibiotics per ID.  Continue PT as medically appropriate.  Patient will need SNF upon discharge. He is WBAT on the right hip with PT.  Patient should continue lovenox 40 mg daily for DVT prophylaxis x 2 weeks upon  discharge.  Patient should follow up in my office in approximately 7-10 after discharge.    Thornton Park , MD 02/25/2019, 12:35 PM

## 2019-02-25 NOTE — Progress Notes (Signed)
PROGRESS NOTE    [name]  [id number] DOB: [birth year] DOA: 02/16/2019  PCP: Tonia Ghent, MD    LOS - 9   Brief Narrative:  68 year old with history of HTN,Parkinson'sdisease status postdeepbrain stimulator, bradycardia, PTSD, obstructive sleep apnea presented after mechanical fall with right hip fracture.Tolerated ORIF well on 11/2.Patient developed feveron afternoon of 11/4, Tmax 102.2.Covid re-tested and negative.CXR not conclusive, but possible infiltrate, started on Unasyn. Blood culturereturn with E.coli in 1 of 4 bottles.ID consulted. Antibiotic changed to meropenem for empiric ESBL coverage pending culture sensitivities.Surgical site clean and without signs of infection  Subjective 11/9: Patient seen awake and laying in bed with wife at bedside.  He is more talkative today, continues to progress in this regard.  He reports hip pain after working with PT earlier.  Asks for advanced diet because he hates the pureed and nectar liquids.  Assessment & Plan:   Principal Problem:   E. coli bacteremia Active Problems:   Closed right hip fracture (HCC)   Aspiration pneumonia (HCC)   Dysphagia   Essential hypertension   Erectile dysfunction   HLD (hyperlipidemia)   Depression   GERD (gastroesophageal reflux disease)   Fever:resolved E. Coli Bacteremia:improved Concern for Aspiration Pneumonia: resolved - covid-19negative (11/4), repeat pending for discharge to SNF - chest xraywith diffuse interstitial prominence which could be edema or atypical infection, concern for aspiration - ID consulted - d/c'd merem after culture susceptibilities 11/8 -> rocephin -continue Rocephin while inpatient - discharge with Bactrim DS oral x 5 more days - Tylenol PRN  Right hip fracture, closed status post fall -Status post ORIF 02/18/2019 -PT/OT -Pain control, bowel regimen -Lovenox for postop DVT prophylaxis. - agrees to SNF, accepted [facility]   Constipation- bowel regimen ordered, please give  - will add Senna  Dysphagia- worsened with above acute illness -Seen by speech therapist,initially deemedmild aspiration riskand recommend dysphagia 3 diet. - Has had increased difficulty with swallowing and overt aspiration on repeat swallow evaluation 11/6. Seems to have more difficulty when not feeling well, febrile etc. When he is more alert and interactive he does better w swallow. - aspiration precautions - will resume dysphagia I (pureed) diet w nectar thickened liquids after discussion with SLP.  History of Parkinson's disease -Continue Sinemet  History of anxiety/depression -Continue Lexapro and Klonopin  GERD -PPI   DVT prophylaxis:Lovenox Code Status: Full Code Family Communication:wifeat bedside, updated today, 11/5 Disposition Plan:To SNF tomorrow pending covid test result   Consultants:  ortho  Procedures:  ORIF 02/18/19  Antimicrobials:  Unasyn, start 11/4,stopped 11/6  Meropenem, start 11/6, stop TBD   Objective: Vitals:   02/23/19 2341 02/24/19 1609 02/24/19 2037 02/25/19 0423  BP: 120/89 (!) 142/73 (!) 150/69 (!) 150/78  Pulse: (!) 49 (!) 51 (!) 50 (!) 48  Resp: 18 18 20 19   Temp: 98.5 F (36.9 C) 98.6 F (37 C) 98.1 F (36.7 C) 97.6 F (36.4 C)  TempSrc:   Oral Oral  SpO2: 100% 95% 98% 98%  Weight:      Height:        Intake/Output Summary (Last 24 hours) at 02/25/2019 1421 Last data filed at 02/25/2019 1404 Gross per 24 hour  Intake 650.57 ml  Output 801 ml  Net -150.43 ml   Filed Weights   02/16/19 1630 02/17/19 1959 02/18/19 1344  Weight: 83.9 kg 86.8 kg 86 kg    Examination:  General exam: awake, alert, no acute distress Respiratory system: clear to auscultation bilaterally, no wheezes,  rales or rhonchi, normal respiratory effort. Cardiovascular system: normal S1/S2, RRR, no JVD, murmurs, rubs, gallops, no pedal edema.   Gastrointestinal system:  soft, non-tender, non-distended abdomen, no organomegaly or masses felt, normal bowel sounds. Central nervous system: alert and oriented x4. no gross focal neurologic deficits, normal speech Extremities: moves all, no edema, normal tone Skin: dry, intact, normal temperature Psychiatry: normal mood, congruent affect, judgement and insight appear normal    Data Reviewed: I have personally reviewed following labs and imaging studies  CBC: Recent Labs  Lab 02/20/19 0544 02/21/19 0428 02/22/19 0518 02/23/19 0503 02/24/19 0540  WBC 10.2 14.5* 7.6 3.7* 5.6  NEUTROABS  --   --  6.0 2.3 3.0  HGB 11.8* 12.3* 10.5* 11.0* 11.4*  HCT 33.9* 36.6* 31.4* 32.4* 32.6*  MCV 88.3 90.6 89.0 88.5 86.5  PLT 154 141* 148* 149* 0000000   Basic Metabolic Panel: Recent Labs  Lab 02/19/19 0518 02/20/19 0544 02/21/19 0428 02/22/19 0518 02/23/19 0503 02/24/19 0540 02/25/19 0504  NA 136 136 137 135 137 136 138  K 3.3* 4.0 3.8 3.6 3.5 3.6 3.8  CL 104 105 104 104 105 103 106  CO2 24 21* 24 23 22 23 24   GLUCOSE 153* 120* 130* 113* 98 104* 106*  BUN 19 19 21  24* 22 24* 20  CREATININE 0.88 0.77 0.83 0.89 0.76 0.79 0.69  CALCIUM 8.1* 7.9* 8.3* 7.9* 8.0* 8.1* 8.3*  MG 1.7 2.0 2.4 2.2  --   --   --    GFR: Estimated Creatinine Clearance: 94.7 mL/min (by C-G formula based on SCr of 0.69 mg/dL). Liver Function Tests: No results for input(s): AST, ALT, ALKPHOS, BILITOT, PROT, ALBUMIN in the last 168 hours. No results for input(s): LIPASE, AMYLASE in the last 168 hours. No results for input(s): AMMONIA in the last 168 hours. Coagulation Profile: No results for input(s): INR, PROTIME in the last 168 hours. Cardiac Enzymes: No results for input(s): CKTOTAL, CKMB, CKMBINDEX, TROPONINI in the last 168 hours. BNP (last 3 results) No results for input(s): PROBNP in the last 8760 hours. HbA1C: No results for input(s): HGBA1C in the last 72 hours. CBG: No results for input(s): GLUCAP in the last 168 hours.  Lipid Profile: No results for input(s): CHOL, HDL, LDLCALC, TRIG, CHOLHDL, LDLDIRECT in the last 72 hours. Thyroid Function Tests: No results for input(s): TSH, T4TOTAL, FREET4, T3FREE, THYROIDAB in the last 72 hours. Anemia Panel: No results for input(s): VITAMINB12, FOLATE, FERRITIN, TIBC, IRON, RETICCTPCT in the last 72 hours. Sepsis Labs: No results for input(s): PROCALCITON, LATICACIDVEN in the last 168 hours.  Recent Results (from the past 240 hour(s))  SARS Coronavirus 2 by RT PCR (hospital order, performed in [facility] hospital lab) Nasopharyngeal Nasopharyngeal Swab     Status: None   Collection Time: 02/16/19  4:40 PM   Specimen: Nasopharyngeal Swab  Result Value Ref Range Status   SARS Coronavirus 2 NEGATIVE NEGATIVE Final    Comment: (NOTE) If result is NEGATIVE SARS-CoV-2 target nucleic acids are NOT DETECTED. The SARS-CoV-2 RNA is generally detectable in upper and lower  respiratory specimens during the acute phase of infection. The lowest  concentration of SARS-CoV-2 viral copies this assay can detect is 250  copies / mL. A negative result does not preclude SARS-CoV-2 infection  and should not be used as the sole basis for treatment or other  patient management decisions.  A negative result may occur with  improper specimen collection / handling, submission of specimen other  than  nasopharyngeal swab, presence of viral mutation(s) within the  areas targeted by this assay, and inadequate number of viral copies  (<250 copies / mL). A negative result must be combined with clinical  observations, patient history, and epidemiological information. If result is POSITIVE SARS-CoV-2 target nucleic acids are DETECTED. The SARS-CoV-2 RNA is generally detectable in upper and lower  respiratory specimens dur ing the acute phase of infection.  Positive  results are indicative of active infection with SARS-CoV-2.  Clinical  correlation with patient history and other diagnostic  information is  necessary to determine patient infection status.  Positive results do  not rule out bacterial infection or co-infection with other viruses. If result is PRESUMPTIVE POSTIVE SARS-CoV-2 nucleic acids MAY BE PRESENT.   A presumptive positive result was obtained on the submitted specimen  and confirmed on repeat testing.  While 2019 novel coronavirus  (SARS-CoV-2) nucleic acids may be present in the submitted sample  additional confirmatory testing may be necessary for epidemiological  and / or clinical management purposes  to differentiate between  SARS-CoV-2 and other Sarbecovirus currently known to infect humans.  If clinically indicated additional testing with an alternate test  methodology [phone]) is advised. The SARS-CoV-2 RNA is generally  detectable in upper and lower respiratory sp ecimens during the acute  phase of infection. The expected result is Negative. Fact Sheet for Patients:  StrictlyIdeas.no Fact Sheet for Healthcare Providers: BankingDealers.co.za This test is not yet approved or cleared by the [place] FDA and has been authorized for detection and/or diagnosis of SARS-CoV-2 by FDA under an Emergency Use Authorization (EUA).  This EUA will remain in effect (meaning this test can be used) for the duration of the COVID-19 declaration under Section 564(b)(1) of the Act, 21 U.S.C. section 360bbb-3(b)(1), unless the authorization is terminated or revoked sooner. Performed at [facility], [address] [place], [place] [address]   Surgical pcr screen     Status: None   Collection Time: 02/17/19  8:27 PM   Specimen: Nasal Mucosa; Nasal Swab  Result Value Ref Range Status   MRSA, PCR NEGATIVE NEGATIVE Final   Staphylococcus aureus NEGATIVE NEGATIVE Final    Comment: (NOTE) The Xpert SA Assay (FDA approved for NASAL specimens in patients 70 years of age and older), is one component of a  comprehensive surveillance program. It is not intended to diagnose infection nor to guide or monitor treatment. Performed at [facility], [place], [place] [address]   SARS CORONAVIRUS 2 (TAT 6-24 HRS) Nasopharyngeal Nasopharyngeal Swab     Status: None   Collection Time: 02/20/19  2:45 PM   Specimen: Nasopharyngeal Swab  Result Value Ref Range Status   SARS Coronavirus 2 NEGATIVE NEGATIVE Final    Comment: (NOTE) SARS-CoV-2 target nucleic acids are NOT DETECTED. The SARS-CoV-2 RNA is generally detectable in upper and lower respiratory specimens during the acute phase of infection. Negative results do not preclude SARS-CoV-2 infection, do not rule out co-infections with other pathogens, and should not be used as the sole basis for treatment or other patient management decisions. Negative results must be combined with clinical observations, patient history, and epidemiological information. The expected result is Negative. Fact Sheet for Patients: SugarRoll.be Fact Sheet for Healthcare Providers: https://www.woods-mathews.com/ This test is not yet approved or cleared by the [place] FDA and  has been authorized for detection and/or diagnosis of SARS-CoV-2 by FDA under an Emergency Use Authorization (EUA). This EUA will remain  in effect (  meaning this test can be used) for the duration of the COVID-19 declaration under Section 56 4(b)(1) of the Act, 21 U.S.C. section 360bbb-3(b)(1), unless the authorization is terminated or revoked sooner. Performed at [facility], [place] [address] [place], [place] [address]   CULTURE, BLOOD (ROUTINE X 2) w Reflex to ID Panel     Status: None   Collection Time: 02/20/19  7:40 PM   Specimen: BLOOD  Result Value Ref Range Status   Specimen Description   Final    BLOOD Blood Culture results may not be optimal due to an inadequate volume of blood received in culture bottles    Special Requests BOTTLES DRAWN AEROBIC ONLY RIGHT ANTECUBITAL  Final   Culture   Final    NO GROWTH 5 DAYS Performed at [facility], [address] [place], [place] [address]    Report Status 02/25/2019 FINAL  Final  CULTURE, BLOOD (ROUTINE X 2) w Reflex to ID Panel     Status: Abnormal   Collection Time: 02/20/19  8:49 PM   Specimen: BLOOD  Result Value Ref Range Status   Specimen Description   Final    BLOOD LEFT HAND Performed at [facility], [address] [place], [place] [address]    Special Requests   Final    BOTTLES DRAWN AEROBIC AND ANAEROBIC Blood Culture adequate volume Performed at [facility], [address], [place], [place] [address]    Culture  Setup Time   Final    GRAM NEGATIVE RODS ANAEROBIC BOTTLE ONLY CRITICAL RESULT CALLED TO, READ BACK BY AND VERIFIED WITH: [facility] DUNCAN AT [id number] 02/22/2019.PMF Performed at [facility], [place] [address] [place],  [address]    Culture ESCHERICHIA COLI (A)  Final   Report Status 02/24/2019 FINAL  Final   Organism ID, Bacteria ESCHERICHIA COLI  Final      Susceptibility   Escherichia coli - MIC*    AMPICILLIN >=32 RESISTANT Resistant     CEFAZOLIN >=64 RESISTANT Resistant     CEFEPIME <=1 SENSITIVE Sensitive     CEFTAZIDIME <=1 SENSITIVE Sensitive     CEFTRIAXONE <=1 SENSITIVE Sensitive     CIPROFLOXACIN <=0.25 SENSITIVE Sensitive     GENTAMICIN <=1 SENSITIVE Sensitive     IMIPENEM <=0.25 SENSITIVE Sensitive     TRIMETH/SULFA <=20 SENSITIVE Sensitive     AMPICILLIN/SULBACTAM >=32 RESISTANT Resistant     PIP/TAZO 8 SENSITIVE Sensitive     Extended ESBL NEGATIVE Sensitive     * ESCHERICHIA COLI  Blood Culture ID Panel (Reflexed)     Status: Abnormal   Collection Time: 02/20/19  8:49 PM  Result Value Ref Range Status   Enterococcus species NOT DETECTED NOT DETECTED Final   Listeria monocytogenes NOT DETECTED NOT DETECTED Final   Staphylococcus species NOT DETECTED NOT  DETECTED Final   Staphylococcus aureus (BCID) NOT DETECTED NOT DETECTED Final   Streptococcus species NOT DETECTED NOT DETECTED Final   Streptococcus agalactiae NOT DETECTED NOT DETECTED Final   Streptococcus pneumoniae NOT DETECTED NOT DETECTED Final   Streptococcus pyogenes NOT DETECTED NOT DETECTED Final   Acinetobacter baumannii NOT DETECTED NOT DETECTED Final   Enterobacteriaceae species DETECTED (A) NOT DETECTED Final    Comment: Enterobacteriaceae represent a large family of gram-negative bacteria, not a single organism. CRITICAL RESULT CALLED TO, READ BACK BY AND VERIFIED WITH: [facility] DUNCAN AT [id number] 02/22/2019.PMF    Enterobacter cloacae complex NOT DETECTED NOT DETECTED Final   Escherichia coli  DETECTED (A) NOT DETECTED Final    Comment: CRITICAL RESULT CALLED TO, READ BACK BY AND VERIFIED WITH: [facility] DUNCAN AT [id number] 02/22/2019.PMF    Klebsiella oxytoca NOT DETECTED NOT DETECTED Final   Klebsiella pneumoniae NOT DETECTED NOT DETECTED Final   Proteus species NOT DETECTED NOT DETECTED Final   Serratia marcescens NOT DETECTED NOT DETECTED Final   Carbapenem resistance NOT DETECTED NOT DETECTED Final   Haemophilus influenzae NOT DETECTED NOT DETECTED Final   Neisseria meningitidis NOT DETECTED NOT DETECTED Final   Pseudomonas aeruginosa NOT DETECTED NOT DETECTED Final   Candida albicans NOT DETECTED NOT DETECTED Final   Candida glabrata NOT DETECTED NOT DETECTED Final   Candida krusei NOT DETECTED NOT DETECTED Final   Candida parapsilosis NOT DETECTED NOT DETECTED Final   Candida tropicalis NOT DETECTED NOT DETECTED Final    Comment: Performed at [facility], [address] [place], [place] [address]  CULTURE, BLOOD (ROUTINE X 2) w Reflex to ID Panel     Status: None (Preliminary result)   Collection Time: 02/22/19  2:50 PM   Specimen: BLOOD  Result Value Ref Range Status   Specimen Description BLOOD LEFT ANTECUBITAL  Final   Special Requests   Final    BOTTLES  DRAWN AEROBIC AND ANAEROBIC Blood Culture adequate volume   Culture   Final    NO GROWTH 3 DAYS Performed at [facility], [address] [place], [place] [address]    Report Status PENDING  Incomplete  CULTURE, BLOOD (ROUTINE X 2) w Reflex to ID Panel     Status: None (Preliminary result)   Collection Time: 02/22/19  3:00 PM   Specimen: BLOOD  Result Value Ref Range Status   Specimen Description BLOOD BLOOD LEFT HAND  Final   Special Requests   Final    BOTTLES DRAWN AEROBIC AND ANAEROBIC Blood Culture adequate volume   Culture   Final    NO GROWTH 3 DAYS Performed at [facility], [address] [place],  [address]    Report Status PENDING  Incomplete         Radiology Studies: No results found.      Scheduled Meds: . carbidopa-levodopa  1 tablet Oral BID AC  . carbidopa-levodopa  2 tablet Oral Q breakfast  . cholecalciferol  1,000 Units Oral Daily  . docusate sodium  100 mg Oral BID  . enoxaparin (LOVENOX) injection  40 mg Subcutaneous Q24H  . escitalopram  10 mg Oral Daily  . Melatonin  10 mg Oral QHS  . memantine  10 mg Oral BID  . pantoprazole  40 mg Oral Daily  . traMADol  50 mg Oral Q6H   Continuous Infusions: . cefTRIAXone (ROCEPHIN)  IV 2 g (02/24/19 2132)  . methocarbamol (ROBAXIN) IV       LOS: 9 days    Time spent: 25-30 min    Ezekiel Slocumb, DO [facility] Pager: [phone]  If 7PM-7AM, please contact night-coverage [email or web address] Password [email or web address] 02/25/2019, 2:21 PM

## 2019-02-25 NOTE — TOC Progression Note (Signed)
Transition of Care [facility]) - Progression Note    Patient Details  Name: MCDANIEL STONEBURG MRN: [id number] Date of Birth: 1950-08-25  [facility] CM/SW Contact  Su Hilt, RN Phone Number: 02/25/2019, 10:21 AM  Clinical Narrative:    [place] and updated the auth start date, Approval number [id number], next review date 11/11 I notified Miquel [facility] place Will DC once new covid comes back    Expected Discharge Plan: [place] Barriers to Discharge: Ship broker, Continued Medical Work up, SNF Pending bed offer  Expected Discharge Plan and Services Expected Discharge Plan: [place]   Discharge Planning Services: CM Consult   Living arrangements for the past 2 months: Single Family Home                                       Social Determinants of Health (SDOH) Interventions    Readmission Risk Interventions No flowsheet data found.

## 2019-02-25 NOTE — Progress Notes (Signed)
Physical Therapy Treatment Patient Details Name: [name] MRN: [id number] DOB: [birth year] Today's Date: 02/25/2019    History of Present Illness 68 y.o. male presenting to hospital 02/16/19 s/p fall onto R hip when walking without walker and possibly hit head.  Pt s/p IM fixation 11/2 secondary R intertrochanteric hip fx.  PMH includes Parkinson's, B DBS, bradycardia, PTSD, dysrhythmia, htn, sleep apnea, SOB, Dupuytren's contracture, h/o L-spine surgery.    PT Comments    Pt presented with deficits in strength, transfers, mobility, gait, balance, and activity tolerance but made progress towards goals this session.  Pt required +2 min A to get to standing with some difficulty achieving B knee extension and full upright posture but both improved grossly with cues and practice.  Pt was able to pick up his R foot from the floor multiple times this session but was unable to pick up the L foot.  Pt will benefit from PT services in a SNF setting upon discharge to safely address above deficits for decreased caregiver assistance and eventual return to PLOF.     Follow Up Recommendations  SNF     Equipment Recommendations  Other (comment)(TBD at next venue of care)    Recommendations for Other Services       Precautions / Restrictions Precautions Precautions: Fall Restrictions Weight Bearing Restrictions: Yes RLE Weight Bearing: Weight bearing as tolerated    Mobility  Bed Mobility Overal bed mobility: Needs Assistance Bed Mobility: Supine to Sit;Sit to Supine Rolling: Max assist   Supine to sit: Max assist;+2 for physical assistance Sit to supine: Max assist;+2 for physical assistance      Transfers Overall transfer level: Needs assistance Equipment used: Rolling walker (2 wheeled) Transfers: Sit to/from Stand Sit to Stand: Min assist;+2 physical assistance;+2 safety/equipment         General transfer comment: stood x 3  Ambulation/Gait             General  Gait Details: unable this date   Stairs             Wheelchair Mobility    Modified Rankin (Stroke Patients Only)       Balance Overall balance assessment: Needs assistance Sitting-balance support: Bilateral upper extremity supported;Feet supported Sitting balance-Leahy Scale: Fair     Standing balance support: Bilateral upper extremity supported Standing balance-Leahy Scale: Poor Standing balance comment: Posterior instability in standing                            Cognition Arousal/Alertness: Awake/alert Behavior During Therapy: Flat affect Overall Cognitive Status: Impaired/Different from baseline Area of Impairment: Following commands;Awareness                       Following Commands: Follows one step commands inconsistently Safety/Judgement: Decreased awareness of safety;Decreased awareness of deficits Awareness: Intellectual Problem Solving: Slow processing;Requires verbal cues        Exercises Total Joint Exercises Ankle Circles/Pumps: AROM;Strengthening;Both;10 reps;15 reps Quad Sets: Strengthening;Both;5 reps;10 reps Towel Squeeze: Strengthening;5 reps;Both Short Arc Quad: Strengthening;Both;10 reps;5 reps Heel Slides: AAROM;Strengthening;10 reps;Both;5 reps Hip ABduction/ADduction: AAROM;Strengthening;Both;5 reps;10 reps Straight Leg Raises: AAROM;Strengthening;Both;5 reps;10 reps Long Arc Quad: AROM;Both;5 reps;10 reps;Strengthening Other Exercises: Standing RLE marching 2 x 5, mini squats 2 x 5 with low amplitude Other Exercises: Anterior weight shifting activities in sitting to facilitate improved transfers    General Comments        Pertinent Vitals/Pain Pain Assessment: No/denies pain  Home Living                      Prior Function            PT Goals (current goals can now be found in the care plan section) Progress towards PT goals: Progressing toward goals    Frequency    BID      PT  Plan Current plan remains appropriate    Co-evaluation              AM-PAC PT "6 Clicks" Mobility   Outcome Measure  Help needed turning from your back to your side while in a flat bed without using bedrails?: A Lot Help needed moving from lying on your back to sitting on the side of a flat bed without using bedrails?: Total Help needed moving to and from a bed to a chair (including a wheelchair)?: Total Help needed standing up from a chair using your arms (e.g., wheelchair or bedside chair)?: Total Help needed to walk in hospital room?: Total Help needed climbing 3-5 steps with a railing? : Total 6 Click Score: 7    End of Session Equipment Utilized During Treatment: Gait belt Activity Tolerance: Patient tolerated treatment well Patient left: in bed;with call bell/phone within reach;with bed alarm set;with family/visitor present;with SCD's reapplied Nurse Communication: Mobility status;Other (comment)(Per spouse pt noted to have redness/bruising in sacral area) PT Visit Diagnosis: Muscle weakness (generalized) (M62.81);Difficulty in walking, not elsewhere classified (R26.2) Pain - Right/Left: Right Pain - part of body: Hip     Time: 0950-1030 PT Time Calculation (min) (ACUTE ONLY): 40 min  Charges:  $Therapeutic Exercise: 23-37 mins $Therapeutic Activity: 8-22 mins                     D. Scott Gratia Disla PT, DPT 02/25/19, 1:27 PM

## 2019-02-26 DIAGNOSIS — E782 Mixed hyperlipidemia: Secondary | ICD-10-CM | POA: Diagnosis not present

## 2019-02-26 DIAGNOSIS — R7989 Other specified abnormal findings of blood chemistry: Secondary | ICD-10-CM | POA: Diagnosis not present

## 2019-02-26 DIAGNOSIS — S72141A Displaced intertrochanteric fracture of right femur, initial encounter for closed fracture: Secondary | ICD-10-CM | POA: Diagnosis not present

## 2019-02-26 DIAGNOSIS — U071 COVID-19: Secondary | ICD-10-CM | POA: Diagnosis not present

## 2019-02-26 DIAGNOSIS — S72001G Fracture of unspecified part of neck of right femur, subsequent encounter for closed fracture with delayed healing: Secondary | ICD-10-CM | POA: Diagnosis not present

## 2019-02-26 DIAGNOSIS — M2550 Pain in unspecified joint: Secondary | ICD-10-CM | POA: Diagnosis not present

## 2019-02-26 DIAGNOSIS — I10 Essential (primary) hypertension: Secondary | ICD-10-CM | POA: Diagnosis not present

## 2019-02-26 DIAGNOSIS — R7881 Bacteremia: Secondary | ICD-10-CM | POA: Diagnosis not present

## 2019-02-26 DIAGNOSIS — R2681 Unsteadiness on feet: Secondary | ICD-10-CM | POA: Diagnosis not present

## 2019-02-26 DIAGNOSIS — Z79899 Other long term (current) drug therapy: Secondary | ICD-10-CM | POA: Diagnosis not present

## 2019-02-26 DIAGNOSIS — S72141D Displaced intertrochanteric fracture of right femur, subsequent encounter for closed fracture with routine healing: Secondary | ICD-10-CM | POA: Diagnosis not present

## 2019-02-26 DIAGNOSIS — M6281 Muscle weakness (generalized): Secondary | ICD-10-CM | POA: Diagnosis not present

## 2019-02-26 DIAGNOSIS — R1310 Dysphagia, unspecified: Secondary | ICD-10-CM | POA: Diagnosis not present

## 2019-02-26 DIAGNOSIS — F322 Major depressive disorder, single episode, severe without psychotic features: Secondary | ICD-10-CM | POA: Diagnosis not present

## 2019-02-26 DIAGNOSIS — R404 Transient alteration of awareness: Secondary | ICD-10-CM | POA: Diagnosis not present

## 2019-02-26 DIAGNOSIS — K219 Gastro-esophageal reflux disease without esophagitis: Secondary | ICD-10-CM | POA: Diagnosis not present

## 2019-02-26 DIAGNOSIS — E559 Vitamin D deficiency, unspecified: Secondary | ICD-10-CM | POA: Diagnosis not present

## 2019-02-26 DIAGNOSIS — Z7401 Bed confinement status: Secondary | ICD-10-CM | POA: Diagnosis not present

## 2019-02-26 DIAGNOSIS — R279 Unspecified lack of coordination: Secondary | ICD-10-CM | POA: Diagnosis not present

## 2019-02-26 DIAGNOSIS — B9620 Unspecified Escherichia coli [E. coli] as the cause of diseases classified elsewhere: Secondary | ICD-10-CM | POA: Diagnosis not present

## 2019-02-26 DIAGNOSIS — R262 Difficulty in walking, not elsewhere classified: Secondary | ICD-10-CM | POA: Diagnosis not present

## 2019-02-26 DIAGNOSIS — R41841 Cognitive communication deficit: Secondary | ICD-10-CM | POA: Diagnosis not present

## 2019-02-26 DIAGNOSIS — R1312 Dysphagia, oropharyngeal phase: Secondary | ICD-10-CM | POA: Diagnosis not present

## 2019-02-26 DIAGNOSIS — F0280 Dementia in other diseases classified elsewhere without behavioral disturbance: Secondary | ICD-10-CM | POA: Diagnosis not present

## 2019-02-26 DIAGNOSIS — F4310 Post-traumatic stress disorder, unspecified: Secondary | ICD-10-CM | POA: Diagnosis not present

## 2019-02-26 DIAGNOSIS — R296 Repeated falls: Secondary | ICD-10-CM | POA: Diagnosis not present

## 2019-02-26 DIAGNOSIS — G20 Parkinson's disease: Secondary | ICD-10-CM | POA: Diagnosis not present

## 2019-02-26 DIAGNOSIS — R001 Bradycardia, unspecified: Secondary | ICD-10-CM | POA: Diagnosis not present

## 2019-02-26 DIAGNOSIS — J690 Pneumonitis due to inhalation of food and vomit: Secondary | ICD-10-CM | POA: Diagnosis not present

## 2019-02-26 DIAGNOSIS — S72001D Fracture of unspecified part of neck of right femur, subsequent encounter for closed fracture with routine healing: Secondary | ICD-10-CM | POA: Diagnosis not present

## 2019-02-26 DIAGNOSIS — I69891 Dysphagia following other cerebrovascular disease: Secondary | ICD-10-CM | POA: Diagnosis not present

## 2019-02-26 DIAGNOSIS — W19XXXA Unspecified fall, initial encounter: Secondary | ICD-10-CM | POA: Diagnosis not present

## 2019-02-26 MED ORDER — SULFAMETHOXAZOLE-TRIMETHOPRIM 800-160 MG PO TABS
1.0000 | ORAL_TABLET | Freq: Two times a day (BID) | ORAL | Status: DC
  Administered 2019-02-26: 1 via ORAL
  Filled 2019-02-26: qty 1

## 2019-02-26 MED ORDER — SULFAMETHOXAZOLE-TRIMETHOPRIM 800-160 MG PO TABS: 1.0000 | ORAL_TABLET | Freq: Two times a day (BID) | ORAL | 0 refills | Status: AC

## 2019-02-26 MED ORDER — TRAMADOL HCL 50 MG PO TABS: 50.0000 mg | ORAL_TABLET | Freq: Four times a day (QID) | ORAL | 0 refills | Status: AC | PRN

## 2019-02-26 NOTE — TOC Progression Note (Signed)
Transition of Care [facility]) - Progression Note    Patient Details  Name: [name] MRN: [id number] Date of Birth: [birth year]  [facility] CM/SW Contact  Su Hilt, RN Phone Number: 02/26/2019, 10:13 AM  Clinical Narrative:    Patient is to DC to [facility] today, I called his Wife [name] and made her aware    Expected Discharge Plan: Skilled Nursing Facility Barriers to Discharge: Insurance Authorization, Continued Medical Work up, SNF Pending bed offer  Expected Discharge Plan and Services Expected Discharge Plan: Organ   Discharge Planning Services: CM Consult   Living arrangements for the past 2 months: Single Family Home Expected Discharge Date: 02/26/19                                     Social Determinants of Health (SDOH) Interventions    Readmission Risk Interventions No flowsheet data found.

## 2019-02-26 NOTE — Progress Notes (Addendum)
Speech Language Pathology Treatment: Dysphagia  Patient Details Name: [name] MRN: [id number] DOB: June 10, 1950 Today's Date: 02/26/2019 Time: 0825-0906 SLP Time Calculation (min) (ACUTE ONLY): 50 min  Assessment / Plan / Recommendation Clinical Impression  Pt seen for ongoing assessment of swallowing; toleration of oral intake; trials to upgrade diet consistency if able (primarily foods).  Pt awake, this morning, verbally responsive w/ intermittent conversation w/ SLP. Noted slow, deliberate motor movements w/ UEs; min difficulty w/ coordination to hold utensil.  Trials of mech soft foods moistened well were provided this session. Pt appeared to adequately tolerate swallowing the trials w/ no overt, clinical s/s of aspiration noted - no wet vocal quality, no coughing/throat clearing, no decline in respiratory status during/post trials. Adequate pharyngeal phase swallowing noted w/ trials of mech soft foods and NECTAR consistency liquids (sips VIA CUP taken b/t bites intermittently).  However, during the oral phase, pt exhibited min slower motor movements for mastication of increased textured trials of mech soft foods. Condiments added to moisten for easier mastication effort. W/ time, pt demonstrated A-P transfer and swallow of trials. Min, diffuse oral residue was noted post initial swallow - pt was instructed on alternating food bites w/ liquid sips and using a lingual sweep and f/u, Dry swallow to aid oral clearing.  Pt required verbal cues for direction during session. Recommend a Dysphagia level 3 (mech soft) diet w/ NECTAR consistency liquids via Cup; aspiration precautions; Pills in Puree for safer swallowing. Recommend support at meals; ENCOURAGEMENT to eat at meals(foods of preference). Recommend Dietician f/u for supplements.  Recommend ST services f/u for trials to upgrade to thin liquids as pt's medical status and strength continue to improve for best swallowing effort/timing.  Handouts given/elft in room on aspiration precautions; thickened liquids, diet (3) consistency; Rije drink cup for monitoring liquid flow when drinking for home use. MD present and updated.     HPI HPI: [name] is a 68 y.o. male with a known history of essential hypertension, Parkinson's disease status post brain stimulator, bradycardia, PTSD, obstructive sleep apnea who presents to the hospital after mechanical fall noted to have a right hip fracture.  Patient was in his usual state of health and was walking in his backyard without his walker when he lost his footing and fell to the ground.  He did not lose consciousness, he had no head trauma he had no prodromal symptoms of chest pain, shortness of breath palpitations or any other associated symptoms.  He presented to the ER and underwent x-ray which showed right hip fracture.  Hospitalist services were contacted for admission.  Chest X-ray clear.  Surgery for hip fracture 02/18/2019.  Last MBS April/2017 with recommendation for a regular diet with thin liquids.  Per patient and wife- no problems with this diet until this hospital admission - suspect related to the acute trauma/illness.  CXR 02/21/2019: revealed "diffuse interstitial prominence may represent mild congestion or edema".  MBSS completed on 02/22/2019 revealing a delay in pharyngeal swallow initiation. This along w/ baseline Parkinson's Dis increases risk for aspiration thus pulmonary decline.       SLP Plan  Continue with current plan of care       Recommendations  Diet recommendations: Dysphagia 3 (mechanical soft);Nectar-thick liquid Liquids provided via: Cup;No straw Medication Administration: Whole meds with puree(vs Crushed if safer, indicated) Supervision: Patient able to self feed;Staff to assist with self feeding;Intermittent supervision to cue for compensatory strategies Compensations: Minimize environmental distractions;Slow rate;Small sips/bites;Lingual sweep for  clearance  of pocketing;Multiple dry swallows after each bite/sip;Follow solids with liquid Postural Changes and/or Swallow Maneuvers: Seated upright 90 degrees;Upright 30-60 min after meal                General recommendations: OT consult(for builtup handle on utensil; self-feeding) Oral Care Recommendations: Oral care BID;Staff/trained caregiver to provide oral care Follow up Recommendations: Skilled Nursing facility SLP Visit Diagnosis: Dysphagia, oropharyngeal phase (R13.12)(baseline Parkinson's Dis) Plan: Continue with current plan of care       [place], [place], CCC-SLP Watson,Katherine 02/26/2019, 9:03 AM

## 2019-02-26 NOTE — Progress Notes (Signed)
Physical Therapy Treatment Patient Details Name: [name] MRN: [id number] DOB: [birth year] Today's Date: 02/26/2019    History of Present Illness 68 y.o. male presenting to hospital 02/16/19 s/p fall onto R hip when walking without walker and possibly hit head.  Pt s/p IM fixation 11/2 secondary R intertrochanteric hip fx.  PMH includes Parkinson's, B DBS, bradycardia, PTSD, dysrhythmia, htn, sleep apnea, SOB, Dupuytren's contracture, h/o L-spine surgery.    PT Comments    Pt presented with deficits in strength, transfers, mobility, gait, balance, and activity tolerance.  Pt continues to require extensive physical assistance with all functional tasks.  Amb attempted with RW this session with pt able to briefly lift RLE from the floor but remained unable to lift the L foot from the floor.  In standing pt presented with heavy trunk flexion and lean on the RW and even with cuing was unable to come to full upright standing.  Pt required physical assistance on multiple occasions while in standing to prevent posterior LOB and is at a very high risk for falls.  Pt will benefit from PT services in a SNF setting upon discharge to safely address above deficits for decreased caregiver assistance and eventual return to PLOF.     Follow Up Recommendations  SNF     Equipment Recommendations  Other (comment)(TBD at next venue of care)    Recommendations for Other Services       Precautions / Restrictions Precautions Precautions: Fall Restrictions Weight Bearing Restrictions: Yes RLE Weight Bearing: Weight bearing as tolerated    Mobility  Bed Mobility Overal bed mobility: Needs Assistance Bed Mobility: Supine to Sit;Sit to Supine Rolling: Max assist   Supine to sit: Max assist Sit to supine: Max assist   General bed mobility comments: Max A for RLE in and out of bed and for trunk control  Transfers Overall transfer level: Needs assistance Equipment used: Rolling walker (2  wheeled) Transfers: Sit to/from Stand Sit to Stand: Max assist;+2 safety/equipment         General transfer comment: Mod verbal and tactile cues for sequencing with pt unable to come to full upright position  Ambulation/Gait Ambulation/Gait assistance: +2 safety/equipment           General Gait Details: Pt able to perform limited standing marching with the RLE but unable to lift the L foot from the floor   Stairs             Wheelchair Mobility    Modified Rankin (Stroke Patients Only)       Balance Overall balance assessment: Needs assistance Sitting-balance support: Bilateral upper extremity supported;Feet supported Sitting balance-Leahy Scale: Fair Sitting balance - Comments: Min posterior and L lateral lean in sitting that the pt was able to self-correct with cues   Standing balance support: Bilateral upper extremity supported Standing balance-Leahy Scale: Poor Standing balance comment: Posterior instability in standing                            Cognition Arousal/Alertness: Awake/alert Behavior During Therapy: Flat affect Overall Cognitive Status: Impaired/Different from baseline Area of Impairment: Following commands;Awareness                       Following Commands: Follows one step commands inconsistently Safety/Judgement: Decreased awareness of safety;Decreased awareness of deficits   Problem Solving: Slow processing;Requires verbal cues        Exercises Total Joint Exercises Ankle Circles/Pumps:  AROM;Strengthening;Both;10 reps Quad Sets: Strengthening;Both;10 reps Towel Squeeze: Strengthening;5 reps;Both Short Arc Quad: Strengthening;Both;10 reps Heel Slides: AAROM;Strengthening;Both;5 reps Hip ABduction/ADduction: AAROM;Strengthening;Both;10 reps Straight Leg Raises: AAROM;Strengthening;Both;10 reps Long Arc Quad: AROM;Strengthening;Both;5 reps Marching in Standing: AROM;Right;5 reps;Standing    General Comments         Pertinent Vitals/Pain Pain Assessment: 0-10 Pain Score: 8  Pain Location: R hip Pain Descriptors / Indicators: Sore;Aching Pain Intervention(s): Premedicated before session;Monitored during session    Home Living                      Prior Function            PT Goals (current goals can now be found in the care plan section) Progress towards PT goals: Not progressing toward goals - comment(No progress towards goals this session with pt limited by RLE functional weakness)    Frequency    BID      PT Plan Current plan remains appropriate    Co-evaluation              AM-PAC PT "6 Clicks" Mobility   Outcome Measure  Help needed turning from your back to your side while in a flat bed without using bedrails?: A Lot Help needed moving from lying on your back to sitting on the side of a flat bed without using bedrails?: A Lot Help needed moving to and from a bed to a chair (including a wheelchair)?: Total Help needed standing up from a chair using your arms (e.g., wheelchair or bedside chair)?: A Lot Help needed to walk in hospital room?: Total Help needed climbing 3-5 steps with a railing? : Total 6 Click Score: 9    End of Session Equipment Utilized During Treatment: Gait belt Activity Tolerance: Patient tolerated treatment well;No increased pain Patient left: in bed;with call bell/phone within reach;with bed alarm set;with SCD's reapplied;Other (comment)(Pt left in L sidelying for turning protocol) Nurse Communication: Mobility status PT Visit Diagnosis: Muscle weakness (generalized) (M62.81);Difficulty in walking, not elsewhere classified (R26.2) Pain - Right/Left: Right Pain - part of body: Hip     Time: [id number] PT Time Calculation (min) (ACUTE ONLY): 24 min  Charges:  $Therapeutic Exercise: 8-22 mins $Therapeutic Activity: 8-22 mins                     D. Scott Elliana Bal PT, DPT 02/26/19, 10:47 AM

## 2019-02-26 NOTE — Progress Notes (Signed)
  Subjective:  POD #8 s/p IM fixation for right IT fx.   Patient reports right hip pain as mild.  Making progress with PT.  Being discharged to SNF today.  Objective:   VITALS:   Vitals:   02/25/19 0423 02/25/19 1727 02/26/19 0033 02/26/19 0746  BP: (!) 150/78 133/72 (!) 155/69 (!) 149/69  Pulse: (!) 48 (!) 46 (!) 43 (!) 42  Resp: 19 18 17 18   Temp: 97.6 F (36.4 C) 98 F (36.7 C) 98.2 F (36.8 C) 98.3 F (36.8 C)  TempSrc: Oral Oral    SpO2: 98% 97% 96% 98%  Weight:      Height:        PHYSICAL EXAM: Right lower extremity Neurovascular intact Sensation intact distally Intact pulses distally Dorsiflexion/Plantar flexion intact Incision: scant drainage No cellulitis present Compartment soft  LABS  No results found for this or any previous visit (from the past 24 hour(s)).  No results found.  Assessment/Plan: 8 Days Post-Op   Principal Problem:   E. coli bacteremia Active Problems:   Essential hypertension   Erectile dysfunction   HLD (hyperlipidemia)   Depression   GERD (gastroesophageal reflux disease)   Closed right hip fracture (HCC)   Aspiration pneumonia ([place])   Dysphagia  Discharge to SNF today.  Continue antibiotics per medicine.  Continue PT.  Follow up in my office in 7-10 days.  Continue daily lovenox for DVT prophylaxis until followup.    Thornton Park , MD 02/26/2019, 11:40 AM

## 2019-02-26 NOTE — Discharge Summary (Signed)
Physician Discharge Summary  [name] [id number] DOB: 10-Oct-1950 DOA: 02/16/2019  PCP: Tonia Ghent, MD  Admit date: 02/16/2019 Discharge date: 02/26/2019  Admitted From: Home Disposition:  SNF  Recommendations for Outpatient Follow-up:  1. Follow up with PCP in 1-2 weeks 2. Please obtain BMP/CBC in one week 3. Follow up with Orthopedist in 7-10 days  Home Health: No Equipment/Devices: N/A  Discharge Condition: Stable CODE STATUS: Full Diet recommendation:  Dysphagia level 3(mech soft) w/ NECTAR consistency liquids; Aspiration precautions Pills in Puree. Tray setup; assist at meals.  MIGHT SHAKES twice daily at 10 AM and 2 PM  Brief/Interim Summary:  68 year old with history of HTN,Parkinson'sdisease status postdeepbrain stimulator, bradycardia, PTSD, obstructive sleep apnea presented after mechanical fall with right hip fracture.Tolerated ORIF well on 11/2.Patient developed feveron afternoon of 11/4, Tmax 102.2.Covid re-tested and negative.CXR not conclusive, but possible infiltrate, started on Unasyn for presumed aspiration PNA. Blood culturereturned with E.coli in 1 of 4 bottles, started on meropenem for empiric ESBL converage.  ID consulted. Sensitivities returned, non-ESBL, transitioned to Rocephin and to discharge with 5 more days of Bactrim. Physical therapy worked with patient post-op and recommend SNF for further rehab prior to returning home.  Patient is stable for discharge today.   Discharge Diagnoses: Principal Problem:   E. coli bacteremia Active Problems:   Closed right hip fracture (HCC)   Aspiration pneumonia (HCC)   Dysphagia   Essential hypertension   Erectile dysfunction   HLD (hyperlipidemia)   Depression   GERD (gastroesophageal reflux disease)    Discharge Instructions take Bactrim DS antibiotic twice daily for 5 more days.  Discharge Instructions    Call MD for:  redness, tenderness, or signs of infection (pain,  swelling, redness, odor or green/yellow discharge around incision site)   Complete by: As directed    Call MD for:  severe uncontrolled pain   Complete by: As directed    Call MD for:  temperature >100.4   Complete by: As directed    Diet - low sodium heart healthy   Complete by: As directed    Dysphagia level 3(mech soft) w/ NECTAR consistency liquids Aspiration precautions Pills in Puree Tray setup; assist at meals.   Might Shakes twice daily at 10:00 AM and 2:00 PM   Discharge instructions   Complete by: As directed    Take antibiotic (Bactrim DS) twice daily for 5 more days.   Increase activity slowly   Complete by: As directed      Allergies as of 02/26/2019      Reactions   Klonopin [clonazepam] Other (See Comments)   Worsening mood/depression   Morphine And Related Other (See Comments)   hallucinations   Prednisone Other (See Comments)   Unclear reaction      Medication List    TAKE these medications   bisacodyl 10 MG suppository Commonly known as: DULCOLAX Place 1 suppository (10 mg total) rectally daily as needed for moderate constipation.   carbidopa-levodopa 25-100 MG tablet Commonly known as: SINEMET IR Take 1 tablet by mouth 3 (three) times daily. What changed:   how much to take  when to take this  additional instructions   cholecalciferol 1000 units tablet Commonly known as: VITAMIN D Take 1,000 Units by mouth daily.   docusate sodium 100 MG capsule Commonly known as: COLACE Take 1 capsule (100 mg total) by mouth 2 (two) times daily.   enoxaparin 40 MG/0.4ML injection Commonly known as: LOVENOX Inject 0.4 mLs (40 mg total) into  the skin daily for 14 days.   escitalopram 10 MG tablet Commonly known as: LEXAPRO Take 1 tablet (10 mg total) by mouth daily.   Melatonin 10 MG Tabs Take 10 mg by mouth at bedtime.   memantine 10 MG tablet Commonly known as: NAMENDA TAKE 1 TABLET TWICE A DAY   naproxen sodium 220 MG tablet Commonly known  as: ALEVE Take 440 mg by mouth 2 (two) times daily as needed (for pain or headache).   omeprazole 20 MG capsule Commonly known as: PRILOSEC TAKE 1 CAPSULE DAILY   sulfamethoxazole-trimethoprim 800-160 MG tablet Commonly known as: BACTRIM DS Take 1 tablet by mouth every 12 (twelve) hours for 5 days.   traMADol 50 MG tablet Commonly known as: ULTRAM Take 1 tablet (50 mg total) by mouth every 6 (six) hours as needed for up to 5 days for moderate pain or severe pain.      Contact information for after-discharge care    Destination    [facility] .   Service: Skilled Nursing Contact information: [address] [place] [phone]             Allergies  Allergen Reactions  . Klonopin [Clonazepam] Other (See Comments)    Worsening mood/depression  . Morphine And Related Other (See Comments)    hallucinations  . Prednisone Other (See Comments)    Unclear reaction    Consultations:  Orthopedics   Procedures/Studies: Dg Chest 1 View  Result Date: 02/16/2019 CLINICAL DATA:  Hip fracture EXAM: CHEST  1 VIEW COMPARISON:  September 25, 2018 FINDINGS: The heart size and mediastinal contours are within normal limits. Both lungs are clear. The visualized skeletal structures are unremarkable. IMPRESSION: No active disease. Electronically Signed   By: Dorise Bullion III M.D   On: 02/16/2019 17:03   Ct Head Wo Contrast  Result Date: 02/16/2019 CLINICAL DATA:  Lost balance and fell approximately 3 p.m. today. EXAM: CT HEAD WITHOUT CONTRAST CT CERVICAL SPINE WITHOUT CONTRAST TECHNIQUE: Multidetector CT imaging of the head and cervical spine was performed following the standard protocol without intravenous contrast. Multiplanar CT image reconstructions of the cervical spine were also generated. COMPARISON:  Head CT 10/11/2017 FINDINGS: CT HEAD FINDINGS Brain: Bilateral thalamic stimulators are noted. Stable ventriculomegaly and cerebral  atrophy. No CT findings for acute intracranial process such as hemorrhage or infarction. No extra-axial fluid collections are identified. No mass lesions. Vascular: Advanced vascular calcifications for age but no hyperdense vessels or aneurysm. Skull: No skull fracture or bone lesions. Sinuses/Orbits: The paranasal sinuses and mastoid air cells are clear. The globes are intact. Other: No scalp lesions or hematoma. CT CERVICAL SPINE FINDINGS Alignment: Normal Skull base and vertebrae: No acute fracture. No primary bone lesion or focal pathologic process. Soft tissues and spinal canal: No prevertebral fluid or swelling. No visible canal hematoma. Disc levels: The spinal canal is fairly generous. No significant spinal stenosis. Mild uncinate spurring changes and facet disease but no significant foraminal stenosis. Upper chest: The visualized lung apices are grossly clear. Aortic calcifications are noted. Other: Age advanced carotid artery calcifications are noted bilaterally. There also moderate vertebral artery calcifications. IMPRESSION: 1. No acute intracranial findings or skull fracture. 2. Stable position and appearance of the bilateral thalamic stimulators. 3. Normal alignment of the cervical vertebral bodies and no acute cervical spine fracture. 4. Age advanced vascular calcifications. Electronically Signed   By: Marijo Sanes M.D.   On: 02/16/2019 17:16   Ct Cervical Spine  Wo Contrast  Result Date: 02/16/2019 CLINICAL DATA:  Lost balance and fell approximately 3 p.m. today. EXAM: CT HEAD WITHOUT CONTRAST CT CERVICAL SPINE WITHOUT CONTRAST TECHNIQUE: Multidetector CT imaging of the head and cervical spine was performed following the standard protocol without intravenous contrast. Multiplanar CT image reconstructions of the cervical spine were also generated. COMPARISON:  Head CT 10/11/2017 FINDINGS: CT HEAD FINDINGS Brain: Bilateral thalamic stimulators are noted. Stable ventriculomegaly and cerebral  atrophy. No CT findings for acute intracranial process such as hemorrhage or infarction. No extra-axial fluid collections are identified. No mass lesions. Vascular: Advanced vascular calcifications for age but no hyperdense vessels or aneurysm. Skull: No skull fracture or bone lesions. Sinuses/Orbits: The paranasal sinuses and mastoid air cells are clear. The globes are intact. Other: No scalp lesions or hematoma. CT CERVICAL SPINE FINDINGS Alignment: Normal Skull base and vertebrae: No acute fracture. No primary bone lesion or focal pathologic process. Soft tissues and spinal canal: No prevertebral fluid or swelling. No visible canal hematoma. Disc levels: The spinal canal is fairly generous. No significant spinal stenosis. Mild uncinate spurring changes and facet disease but no significant foraminal stenosis. Upper chest: The visualized lung apices are grossly clear. Aortic calcifications are noted. Other: Age advanced carotid artery calcifications are noted bilaterally. There also moderate vertebral artery calcifications. IMPRESSION: 1. No acute intracranial findings or skull fracture. 2. Stable position and appearance of the bilateral thalamic stimulators. 3. Normal alignment of the cervical vertebral bodies and no acute cervical spine fracture. 4. Age advanced vascular calcifications. Electronically Signed   By: Marijo Sanes M.D.   On: 02/16/2019 17:16   Dg Chest Port 1 View  Result Date: 02/20/2019 CLINICAL DATA:  68 year old male with fever. EXAM: PORTABLE CHEST 1 VIEW COMPARISON:  Chest radiograph dated 02/16/2019 FINDINGS: There is shallow inspiration with bibasilar atelectasis. Diffuse interstitial prominence may represent mild congestion or edema. Atypical infection is not excluded. Clinical correlation is recommended. No focal consolidation, pleural effusion, or pneumothorax. Stable cardiomegaly. Stimulator device noted over the chest. No acute osseous pathology. IMPRESSION: 1. Shallow inspiration  with bibasilar atelectasis. 2. Diffuse interstitial prominence may represent mild edema or atypical infection. 3. Stable cardiomegaly. Electronically Signed   By: Anner Crete M.D.   On: 02/20/2019 15:44   Dg Hip Port Unilat With Pelvis 1v Right  Result Date: 02/18/2019 CLINICAL DATA:  Postop intramedullary nailing of intertrochanteric fracture. EXAM: DG HIP (WITH OR WITHOUT PELVIS) 1V PORT RIGHT COMPARISON:  Intraoperative views of 02/18/2019 preoperative assessment of 02/16/2019 FINDINGS: AP and cross table lateral views show intramedullary nailing and femoral neck screw in similar position, no signs of hardware complication. Lucency over the right hip and skin staples compatible with recent surgical change. Femoral head remains located. Signs of lumbar spinal fusion are partially imaged. IMPRESSION: Post ORIF of right intertrochanteric fracture without complicating features. Electronically Signed   By: Zetta Bills M.D.   On: 02/18/2019 18:05   Dg Hip Operative Unilat W Or W/o Pelvis Right  Result Date: 02/18/2019 CLINICAL DATA:  Right hip item nail placement EXAM: OPERATIVE RIGHT HIP (WITH PELVIS IF PERFORMED) 4 VIEWS TECHNIQUE: Fluoroscopic spot image(s) were submitted for interpretation post-operatively. COMPARISON:  None. FLUOROSCOPY TIME:  Fluoroscopy Time:  42 seconds FINDINGS: Multiple intraoperative fluoroscopic spot images are provided without a radiologist present. Interval placement of a right proximal femoral intramedullary nail and interlocking femoral neck screw. IMPRESSION: Intraoperative localization Electronically Signed   By: Kathreen Devoid   On: 02/18/2019 16:55   Dg Hip  Unilat W Or Wo Pelvis 2-3 Views Right  Result Date: 02/16/2019 CLINICAL DATA:  Pain after fall EXAM: DG HIP (WITH OR WITHOUT PELVIS) 2-3V RIGHT COMPARISON:  None. FINDINGS: There is an intertrochanteric fracture on the right without significant displacement. No dislocation. Surgical hardware seen in the lower  lumbar spine. Vascular calcifications are seen in the femoral vessels. No other fractures are noted. IMPRESSION: Fracture through the right intertrochanteric region without significant displacement. Electronically Signed   By: Dorise Bullion III M.D   On: 02/16/2019 17:02     - Surgery - ORIF on 02/18/19   Subjective: Patient seen, awake sitting up eating breakfast.  Diet advanced after SLP re-evaluated this AM.  Patient swallowing better now that he feels better.  He does report poor appetite.  Discussed need for nutrition for his body to heal and recover his strength and mobility.     Discharge Exam: Vitals:   02/26/19 0033 02/26/19 0746  BP: (!) 155/69 (!) 149/69  Pulse: (!) 43 (!) 42  Resp: 17 18  Temp: 98.2 F (36.8 C) 98.3 F (36.8 C)  SpO2: 96% 98%   Vitals:   02/25/19 0423 02/25/19 1727 02/26/19 0033 02/26/19 0746  BP: (!) 150/78 133/72 (!) 155/69 (!) 149/69  Pulse: (!) 48 (!) 46 (!) 43 (!) 42  Resp: 19 18 17 18   Temp: 97.6 F (36.4 C) 98 F (36.7 C) 98.2 F (36.8 C) 98.3 F (36.8 C)  TempSrc: Oral Oral    SpO2: 98% 97% 96% 98%  Weight:      Height:        General: Pt is alert, awake, not in acute distress Cardiovascular: RRR, S1/S2 +, no rubs, no gallops Respiratory: CTA bilaterally, no wheezing, no rhonchi Abdominal: Soft, NT, ND, bowel sounds + Extremities: no edema, no cyanosis    The results of significant diagnostics from this hospitalization (including imaging, microbiology, ancillary and laboratory) are listed below for reference.     Microbiology: Recent Results (from the past 240 hour(s))  SARS Coronavirus 2 by RT PCR (hospital order, performed in [facility] hospital lab) Nasopharyngeal Nasopharyngeal Swab     Status: None   Collection Time: 02/16/19  4:40 PM   Specimen: Nasopharyngeal Swab  Result Value Ref Range Status   SARS Coronavirus 2 NEGATIVE NEGATIVE Final    Comment: (NOTE) If result is NEGATIVE SARS-CoV-2 target nucleic acids are  NOT DETECTED. The SARS-CoV-2 RNA is generally detectable in upper and lower  respiratory specimens during the acute phase of infection. The lowest  concentration of SARS-CoV-2 viral copies this assay can detect is 250  copies / mL. A negative result does not preclude SARS-CoV-2 infection  and should not be used as the sole basis for treatment or other  patient management decisions.  A negative result may occur with  improper specimen collection / handling, submission of specimen other  than nasopharyngeal swab, presence of viral mutation(s) within the  areas targeted by this assay, and inadequate number of viral copies  (<250 copies / mL). A negative result must be combined with clinical  observations, patient history, and epidemiological information. If result is POSITIVE SARS-CoV-2 target nucleic acids are DETECTED. The SARS-CoV-2 RNA is generally detectable in upper and lower  respiratory specimens dur ing the acute phase of infection.  Positive  results are indicative of active infection with SARS-CoV-2.  Clinical  correlation with patient history and other diagnostic information is  necessary to determine patient infection status.  Positive results do  not rule out bacterial infection or co-infection with other viruses. If result is PRESUMPTIVE POSTIVE SARS-CoV-2 nucleic acids MAY BE PRESENT.   A presumptive positive result was obtained on the submitted specimen  and confirmed on repeat testing.  While 2019 novel coronavirus  (SARS-CoV-2) nucleic acids may be present in the submitted sample  additional confirmatory testing may be necessary for epidemiological  and / or clinical management purposes  to differentiate between  SARS-CoV-2 and other Sarbecovirus currently known to infect humans.  If clinically indicated additional testing with an alternate test  methodology [phone]) is advised. The SARS-CoV-2 RNA is generally  detectable in upper and lower respiratory sp ecimens  during the acute  phase of infection. The expected result is Negative. Fact Sheet for Patients:  StrictlyIdeas.no Fact Sheet for Healthcare Providers: BankingDealers.co.za This test is not yet approved or cleared by the [place] FDA and has been authorized for detection and/or diagnosis of SARS-CoV-2 by FDA under an Emergency Use Authorization (EUA).  This EUA will remain in effect (meaning this test can be used) for the duration of the COVID-19 declaration under Section 564(b)(1) of the Act, 21 U.S.C. section 360bbb-3(b)(1), unless the authorization is terminated or revoked sooner. Performed at [facility], [address], [place], [place] [address]   Surgical pcr screen     Status: None   Collection Time: 02/17/19  8:27 PM   Specimen: Nasal Mucosa; Nasal Swab  Result Value Ref Range Status   MRSA, PCR NEGATIVE NEGATIVE Final   Staphylococcus aureus NEGATIVE NEGATIVE Final    Comment: (NOTE) The Xpert SA Assay (FDA approved for NASAL specimens in patients 46 years of age and older), is one component of a comprehensive surveillance program. It is not intended to diagnose infection nor to guide or monitor treatment. Performed at [facility], [place], [place] [address]   SARS CORONAVIRUS 2 (TAT 6-24 HRS) Nasopharyngeal Nasopharyngeal Swab     Status: None   Collection Time: 02/20/19  2:45 PM   Specimen: Nasopharyngeal Swab  Result Value Ref Range Status   SARS Coronavirus 2 NEGATIVE NEGATIVE Final    Comment: (NOTE) SARS-CoV-2 target nucleic acids are NOT DETECTED. The SARS-CoV-2 RNA is generally detectable in upper and lower respiratory specimens during the acute phase of infection. Negative results do not preclude SARS-CoV-2 infection, do not rule out co-infections with other pathogens, and should not be used as the sole basis for treatment or other patient management decisions. Negative  results must be combined with clinical observations, patient history, and epidemiological information. The expected result is Negative. Fact Sheet for Patients: SugarRoll.be Fact Sheet for Healthcare Providers: https://www.woods-mathews.com/ This test is not yet approved or cleared by the [place] FDA and  has been authorized for detection and/or diagnosis of SARS-CoV-2 by FDA under an Emergency Use Authorization (EUA). This EUA will remain  in effect (meaning this test can be used) for the duration of the COVID-19 declaration under Section 56 4(b)(1) of the Act, 21 U.S.C. section 360bbb-3(b)(1), unless the authorization is terminated or revoked sooner. Performed at [facility], [place] [address], [place], [place] [address]   CULTURE, BLOOD (ROUTINE X 2) w Reflex to ID Panel     Status: None   Collection Time: 02/20/19  7:40 PM   Specimen: BLOOD  Result Value Ref Range Status   Specimen Description   Final    BLOOD Blood Culture results may not be optimal due to an inadequate volume of  blood received in culture bottles   Special Requests BOTTLES DRAWN AEROBIC ONLY RIGHT ANTECUBITAL  Final   Culture   Final    NO GROWTH 5 DAYS Performed at [facility], [address] [place], [place] [address]    Report Status 02/25/2019 FINAL  Final  CULTURE, BLOOD (ROUTINE X 2) w Reflex to ID Panel     Status: Abnormal   Collection Time: 02/20/19  8:49 PM   Specimen: BLOOD  Result Value Ref Range Status   Specimen Description   Final    BLOOD LEFT HAND Performed at [facility], [address] [place], [place] [address]    Special Requests   Final    BOTTLES DRAWN AEROBIC AND ANAEROBIC Blood Culture adequate volume Performed at [facility], [address] [place], [place] [address]    Culture  Setup Time   Final    GRAM NEGATIVE RODS ANAEROBIC BOTTLE ONLY CRITICAL RESULT CALLED TO, READ BACK BY AND  VERIFIED WITH: [facility] DUNCAN AT [id number] 02/22/2019.PMF Performed at [facility], [place] [address], [place],  [address]    Culture ESCHERICHIA COLI (A)  Final   Report Status 02/24/2019 FINAL  Final   Organism ID, Bacteria ESCHERICHIA COLI  Final      Susceptibility   Escherichia coli - MIC*    AMPICILLIN >=32 RESISTANT Resistant     CEFAZOLIN >=64 RESISTANT Resistant     CEFEPIME <=1 SENSITIVE Sensitive     CEFTAZIDIME <=1 SENSITIVE Sensitive     CEFTRIAXONE <=1 SENSITIVE Sensitive     CIPROFLOXACIN <=0.25 SENSITIVE Sensitive     GENTAMICIN <=1 SENSITIVE Sensitive     IMIPENEM <=0.25 SENSITIVE Sensitive     TRIMETH/SULFA <=20 SENSITIVE Sensitive     AMPICILLIN/SULBACTAM >=32 RESISTANT Resistant     PIP/TAZO 8 SENSITIVE Sensitive     Extended ESBL NEGATIVE Sensitive     * ESCHERICHIA COLI  Blood Culture ID Panel (Reflexed)     Status: Abnormal   Collection Time: 02/20/19  8:49 PM  Result Value Ref Range Status   Enterococcus species NOT DETECTED NOT DETECTED Final   Listeria monocytogenes NOT DETECTED NOT DETECTED Final   Staphylococcus species NOT DETECTED NOT DETECTED Final   Staphylococcus aureus (BCID) NOT DETECTED NOT DETECTED Final   Streptococcus species NOT DETECTED NOT DETECTED Final   Streptococcus agalactiae NOT DETECTED NOT DETECTED Final   Streptococcus pneumoniae NOT DETECTED NOT DETECTED Final   Streptococcus pyogenes NOT DETECTED NOT DETECTED Final   Acinetobacter baumannii NOT DETECTED NOT DETECTED Final   Enterobacteriaceae species DETECTED (A) NOT DETECTED Final    Comment: Enterobacteriaceae represent a large family of gram-negative bacteria, not a single organism. CRITICAL RESULT CALLED TO, READ BACK BY AND VERIFIED WITH: [facility] DUNCAN AT [id number] 02/22/2019.PMF    Enterobacter cloacae complex NOT DETECTED NOT DETECTED Final   Escherichia coli DETECTED (A) NOT DETECTED Final    Comment: CRITICAL RESULT CALLED TO, READ BACK BY AND VERIFIED WITH: [facility]  DUNCAN AT [id number] 02/22/2019.PMF    Klebsiella oxytoca NOT DETECTED NOT DETECTED Final   Klebsiella pneumoniae NOT DETECTED NOT DETECTED Final   Proteus species NOT DETECTED NOT DETECTED Final   Serratia marcescens NOT DETECTED NOT DETECTED Final   Carbapenem resistance NOT DETECTED NOT DETECTED Final   Haemophilus influenzae NOT DETECTED NOT DETECTED Final   Neisseria meningitidis NOT DETECTED NOT DETECTED Final   Pseudomonas aeruginosa NOT DETECTED NOT DETECTED Final   Candida albicans NOT DETECTED NOT DETECTED  Final   Candida glabrata NOT DETECTED NOT DETECTED Final   Candida krusei NOT DETECTED NOT DETECTED Final   Candida parapsilosis NOT DETECTED NOT DETECTED Final   Candida tropicalis NOT DETECTED NOT DETECTED Final    Comment: Performed at [facility], [address] [place], [place] [address]  CULTURE, BLOOD (ROUTINE X 2) w Reflex to ID Panel     Status: None (Preliminary result)   Collection Time: 02/22/19  2:50 PM   Specimen: BLOOD  Result Value Ref Range Status   Specimen Description BLOOD LEFT ANTECUBITAL  Final   Special Requests   Final    BOTTLES DRAWN AEROBIC AND ANAEROBIC Blood Culture adequate volume   Culture   Final    NO GROWTH 4 DAYS Performed at [facility], [address] [place], [place] [address]    Report Status PENDING  Incomplete  CULTURE, BLOOD (ROUTINE X 2) w Reflex to ID Panel     Status: None (Preliminary result)   Collection Time: 02/22/19  3:00 PM   Specimen: BLOOD  Result Value Ref Range Status   Specimen Description BLOOD BLOOD LEFT HAND  Final   Special Requests   Final    BOTTLES DRAWN AEROBIC AND ANAEROBIC Blood Culture adequate volume   Culture   Final    NO GROWTH 4 DAYS Performed at [facility], [address] [place], [place] [address]    Report Status PENDING  Incomplete  SARS CORONAVIRUS 2 (TAT 6-24 HRS) Nasopharyngeal Nasopharyngeal Swab     Status: None   Collection Time: 02/25/19 10:39 AM    Specimen: Nasopharyngeal Swab  Result Value Ref Range Status   SARS Coronavirus 2 NEGATIVE NEGATIVE Final    Comment: (NOTE) SARS-CoV-2 target nucleic acids are NOT DETECTED. The SARS-CoV-2 RNA is generally detectable in upper and lower respiratory specimens during the acute phase of infection. Negative results do not preclude SARS-CoV-2 infection, do not rule out co-infections with other pathogens, and should not be used as the sole basis for treatment or other patient management decisions. Negative results must be combined with clinical observations, patient history, and epidemiological information. The expected result is Negative. Fact Sheet for Patients: SugarRoll.be Fact Sheet for Healthcare Providers: https://www.woods-mathews.com/ This test is not yet approved or cleared by the [place] FDA and  has been authorized for detection and/or diagnosis of SARS-CoV-2 by FDA under an Emergency Use Authorization (EUA). This EUA will remain  in effect (meaning this test can be used) for the duration of the COVID-19 declaration under Section 56 4(b)(1) of the Act, 21 U.S.C. section 360bbb-3(b)(1), unless the authorization is terminated or revoked sooner. Performed at [facility], [place] [address] [place], [place] [address]      Labs: BNP (last 3 results) No results for input(s): BNP in the last 8760 hours. Basic Metabolic Panel: Recent Labs  Lab 02/20/19 0544 02/21/19 0428 02/22/19 0518 02/23/19 0503 02/24/19 0540 02/25/19 0504  NA 136 137 135 137 136 138  K 4.0 3.8 3.6 3.5 3.6 3.8  CL 105 104 104 105 103 106  CO2 21* 24 23 22 23 24   GLUCOSE 120* 130* 113* 98 104* 106*  BUN 19 21 24* 22 24* 20  CREATININE 0.77 0.83 0.89 0.76 0.79 0.69  CALCIUM 7.9* 8.3* 7.9* 8.0* 8.1* 8.3*  MG 2.0 2.4 2.2  --   --   --    Liver Function Tests: No results for input(s): AST, ALT, ALKPHOS, BILITOT, PROT, ALBUMIN in  the last 168 hours. No  results for input(s): LIPASE, AMYLASE in the last 168 hours. No results for input(s): AMMONIA in the last 168 hours. CBC: Recent Labs  Lab 02/20/19 0544 02/21/19 0428 02/22/19 0518 02/23/19 0503 02/24/19 0540  WBC 10.2 14.5* 7.6 3.7* 5.6  NEUTROABS  --   --  6.0 2.3 3.0  HGB 11.8* 12.3* 10.5* 11.0* 11.4*  HCT 33.9* 36.6* 31.4* 32.4* 32.6*  MCV 88.3 90.6 89.0 88.5 86.5  PLT 154 141* 148* 149* 179   Cardiac Enzymes: No results for input(s): CKTOTAL, CKMB, CKMBINDEX, TROPONINI in the last 168 hours. BNP: Invalid input(s): POCBNP CBG: No results for input(s): GLUCAP in the last 168 hours. D-Dimer No results for input(s): DDIMER in the last 72 hours. Hgb A1c No results for input(s): HGBA1C in the last 72 hours. Lipid Profile No results for input(s): CHOL, HDL, LDLCALC, TRIG, CHOLHDL, LDLDIRECT in the last 72 hours. Thyroid function studies No results for input(s): TSH, T4TOTAL, T3FREE, THYROIDAB in the last 72 hours.  Invalid input(s): FREET3 Anemia work up No results for input(s): VITAMINB12, FOLATE, FERRITIN, TIBC, IRON, RETICCTPCT in the last 72 hours. Urinalysis    Component Value Date/Time   COLORURINE AMBER (A) 02/22/2019 1253   APPEARANCEUR HAZY (A) 02/22/2019 1253   APPEARANCEUR Clear 10/23/2012 0138   LABSPEC 1.023 02/22/2019 1253   LABSPEC 1.029 10/23/2012 0138   PHURINE 7.0 02/22/2019 1253   GLUCOSEU NEGATIVE 02/22/2019 1253   GLUCOSEU NEGATIVE 10/11/2017 1537   HGBUR NEGATIVE 02/22/2019 1253   BILIRUBINUR NEGATIVE 02/22/2019 1253   BILIRUBINUR Negative 10/23/2012 0138   KETONESUR NEGATIVE 02/22/2019 1253   PROTEINUR 30 (A) 02/22/2019 1253   UROBILINOGEN 0.2 10/11/2017 1537   NITRITE NEGATIVE 02/22/2019 1253   LEUKOCYTESUR NEGATIVE 02/22/2019 1253   LEUKOCYTESUR 1+ 10/23/2012 0138   Sepsis Labs Invalid input(s): PROCALCITONIN,  WBC,  LACTICIDVEN Microbiology Recent Results (from the past 240 hour(s))  SARS Coronavirus 2 by RT PCR (hospital order,  performed in [place] hospital lab) Nasopharyngeal Nasopharyngeal Swab     Status: None   Collection Time: 02/16/19  4:40 PM   Specimen: Nasopharyngeal Swab  Result Value Ref Range Status   SARS Coronavirus 2 NEGATIVE NEGATIVE Final    Comment: (NOTE) If result is NEGATIVE SARS-CoV-2 target nucleic acids are NOT DETECTED. The SARS-CoV-2 RNA is generally detectable in upper and lower  respiratory specimens during the acute phase of infection. The lowest  concentration of SARS-CoV-2 viral copies this assay can detect is 250  copies / mL. A negative result does not preclude SARS-CoV-2 infection  and should not be used as the sole basis for treatment or other  patient management decisions.  A negative result may occur with  improper specimen collection / handling, submission of specimen other  than nasopharyngeal swab, presence of viral mutation(s) within the  areas targeted by this assay, and inadequate number of viral copies  (<250 copies / mL). A negative result must be combined with clinical  observations, patient history, and epidemiological information. If result is POSITIVE SARS-CoV-2 target nucleic acids are DETECTED. The SARS-CoV-2 RNA is generally detectable in upper and lower  respiratory specimens dur ing the acute phase of infection.  Positive  results are indicative of active infection with SARS-CoV-2.  Clinical  correlation with patient history and other diagnostic information is  necessary to determine patient infection status.  Positive results do  not rule out bacterial infection or co-infection with other viruses. If result is PRESUMPTIVE POSTIVE SARS-CoV-2 nucleic  acids MAY BE PRESENT.   A presumptive positive result was obtained on the submitted specimen  and confirmed on repeat testing.  While 2019 novel coronavirus  (SARS-CoV-2) nucleic acids may be present in the submitted sample  additional confirmatory testing may be necessary for epidemiological  and / or  clinical management purposes  to differentiate between  SARS-CoV-2 and other Sarbecovirus currently known to infect humans.  If clinically indicated additional testing with an alternate test  methodology [phone]) is advised. The SARS-CoV-2 RNA is generally  detectable in upper and lower respiratory sp ecimens during the acute  phase of infection. The expected result is Negative. Fact Sheet for Patients:  StrictlyIdeas.no Fact Sheet for Healthcare Providers: BankingDealers.co.za This test is not yet approved or cleared by the [place] FDA and has been authorized for detection and/or diagnosis of SARS-CoV-2 by FDA under an Emergency Use Authorization (EUA).  This EUA will remain in effect (meaning this test can be used) for the duration of the COVID-19 declaration under Section 564(b)(1) of the Act, 21 U.S.C. section 360bbb-3(b)(1), unless the authorization is terminated or revoked sooner. Performed at [facility], [address], [place], [place] [address]   Surgical pcr screen     Status: None   Collection Time: 02/17/19  8:27 PM   Specimen: Nasal Mucosa; Nasal Swab  Result Value Ref Range Status   MRSA, PCR NEGATIVE NEGATIVE Final   Staphylococcus aureus NEGATIVE NEGATIVE Final    Comment: (NOTE) The Xpert SA Assay (FDA approved for NASAL specimens in patients 37 years of age and older), is one component of a comprehensive surveillance program. It is not intended to diagnose infection nor to guide or monitor treatment. Performed at [facility], [place], [place] [address]   SARS CORONAVIRUS 2 (TAT 6-24 HRS) Nasopharyngeal Nasopharyngeal Swab     Status: None   Collection Time: 02/20/19  2:45 PM   Specimen: Nasopharyngeal Swab  Result Value Ref Range Status   SARS Coronavirus 2 NEGATIVE NEGATIVE Final    Comment: (NOTE) SARS-CoV-2 target nucleic acids are NOT DETECTED. The SARS-CoV-2 RNA  is generally detectable in upper and lower respiratory specimens during the acute phase of infection. Negative results do not preclude SARS-CoV-2 infection, do not rule out co-infections with other pathogens, and should not be used as the sole basis for treatment or other patient management decisions. Negative results must be combined with clinical observations, patient history, and epidemiological information. The expected result is Negative. Fact Sheet for Patients: SugarRoll.be Fact Sheet for Healthcare Providers: https://www.woods-mathews.com/ This test is not yet approved or cleared by the [place] FDA and  has been authorized for detection and/or diagnosis of SARS-CoV-2 by FDA under an Emergency Use Authorization (EUA). This EUA will remain  in effect (meaning this test can be used) for the duration of the COVID-19 declaration under Section 56 4(b)(1) of the Act, 21 U.S.C. section 360bbb-3(b)(1), unless the authorization is terminated or revoked sooner. Performed at [facility], [place] [address] [place], [place] [address]   CULTURE, BLOOD (ROUTINE X 2) w Reflex to ID Panel     Status: None   Collection Time: 02/20/19  7:40 PM   Specimen: BLOOD  Result Value Ref Range Status   Specimen Description   Final    BLOOD Blood Culture results may not be optimal due to an inadequate volume of blood received in culture bottles   Special Requests BOTTLES DRAWN AEROBIC ONLY RIGHT ANTECUBITAL  Final  Culture   Final    NO GROWTH 5 DAYS Performed at [facility], [address] [place], [place] [address]    Report Status 02/25/2019 FINAL  Final  CULTURE, BLOOD (ROUTINE X 2) w Reflex to ID Panel     Status: Abnormal   Collection Time: 02/20/19  8:49 PM   Specimen: BLOOD  Result Value Ref Range Status   Specimen Description   Final    BLOOD LEFT HAND Performed at [facility], [address] [place], [place]  [address]    Special Requests   Final    BOTTLES DRAWN AEROBIC AND ANAEROBIC Blood Culture adequate volume Performed at [facility], [address] [place], [place] [address]    Culture  Setup Time   Final    GRAM NEGATIVE RODS ANAEROBIC BOTTLE ONLY CRITICAL RESULT CALLED TO, READ BACK BY AND VERIFIED WITH: [facility] DUNCAN AT [id number] 02/22/2019.PMF Performed at [facility], [place] [address], [place], [place] [address]    Culture ESCHERICHIA COLI (A)  Final   Report Status 02/24/2019 FINAL  Final   Organism ID, Bacteria ESCHERICHIA COLI  Final      Susceptibility   Escherichia coli - MIC*    AMPICILLIN >=32 RESISTANT Resistant     CEFAZOLIN >=64 RESISTANT Resistant     CEFEPIME <=1 SENSITIVE Sensitive     CEFTAZIDIME <=1 SENSITIVE Sensitive     CEFTRIAXONE <=1 SENSITIVE Sensitive     CIPROFLOXACIN <=0.25 SENSITIVE Sensitive     GENTAMICIN <=1 SENSITIVE Sensitive     IMIPENEM <=0.25 SENSITIVE Sensitive     TRIMETH/SULFA <=20 SENSITIVE Sensitive     AMPICILLIN/SULBACTAM >=32 RESISTANT Resistant     PIP/TAZO 8 SENSITIVE Sensitive     Extended ESBL NEGATIVE Sensitive     * ESCHERICHIA COLI  Blood Culture ID Panel (Reflexed)     Status: Abnormal   Collection Time: 02/20/19  8:49 PM  Result Value Ref Range Status   Enterococcus species NOT DETECTED NOT DETECTED Final   Listeria monocytogenes NOT DETECTED NOT DETECTED Final   Staphylococcus species NOT DETECTED NOT DETECTED Final   Staphylococcus aureus (BCID) NOT DETECTED NOT DETECTED Final   Streptococcus species NOT DETECTED NOT DETECTED Final   Streptococcus agalactiae NOT DETECTED NOT DETECTED Final   Streptococcus pneumoniae NOT DETECTED NOT DETECTED Final   Streptococcus pyogenes NOT DETECTED NOT DETECTED Final   Acinetobacter baumannii NOT DETECTED NOT DETECTED Final   Enterobacteriaceae species DETECTED (A) NOT DETECTED Final    Comment: Enterobacteriaceae represent a large family of gram-negative bacteria, not a  single organism. CRITICAL RESULT CALLED TO, READ BACK BY AND VERIFIED WITH: [facility] DUNCAN AT [id number] 02/22/2019.PMF    Enterobacter cloacae complex NOT DETECTED NOT DETECTED Final   Escherichia coli DETECTED (A) NOT DETECTED Final    Comment: CRITICAL RESULT CALLED TO, READ BACK BY AND VERIFIED WITH: [facility] DUNCAN AT [id number] 02/22/2019.PMF    Klebsiella oxytoca NOT DETECTED NOT DETECTED Final   Klebsiella pneumoniae NOT DETECTED NOT DETECTED Final   Proteus species NOT DETECTED NOT DETECTED Final   Serratia marcescens NOT DETECTED NOT DETECTED Final   Carbapenem resistance NOT DETECTED NOT DETECTED Final   Haemophilus influenzae NOT DETECTED NOT DETECTED Final   Neisseria meningitidis NOT DETECTED NOT DETECTED Final   Pseudomonas aeruginosa NOT DETECTED NOT DETECTED Final   Candida albicans NOT DETECTED NOT DETECTED Final   Candida glabrata NOT DETECTED NOT DETECTED Final   Candida krusei NOT DETECTED NOT DETECTED Final  Candida parapsilosis NOT DETECTED NOT DETECTED Final   Candida tropicalis NOT DETECTED NOT DETECTED Final    Comment: Performed at [facility], [address] [place], [place] [address]  CULTURE, BLOOD (ROUTINE X 2) w Reflex to ID Panel     Status: None (Preliminary result)   Collection Time: 02/22/19  2:50 PM   Specimen: BLOOD  Result Value Ref Range Status   Specimen Description BLOOD LEFT ANTECUBITAL  Final   Special Requests   Final    BOTTLES DRAWN AEROBIC AND ANAEROBIC Blood Culture adequate volume   Culture   Final    NO GROWTH 4 DAYS Performed at [facility], [address] [place], [place] [address]    Report Status PENDING  Incomplete  CULTURE, BLOOD (ROUTINE X 2) w Reflex to ID Panel     Status: None (Preliminary result)   Collection Time: 02/22/19  3:00 PM   Specimen: BLOOD  Result Value Ref Range Status   Specimen Description BLOOD BLOOD LEFT HAND  Final   Special Requests   Final    BOTTLES DRAWN AEROBIC AND ANAEROBIC Blood  Culture adequate volume   Culture   Final    NO GROWTH 4 DAYS Performed at [facility], [address] [place], [place] [address]    Report Status PENDING  Incomplete  SARS CORONAVIRUS 2 (TAT 6-24 HRS) Nasopharyngeal Nasopharyngeal Swab     Status: None   Collection Time: 02/25/19 10:39 AM   Specimen: Nasopharyngeal Swab  Result Value Ref Range Status   SARS Coronavirus 2 NEGATIVE NEGATIVE Final    Comment: (NOTE) SARS-CoV-2 target nucleic acids are NOT DETECTED. The SARS-CoV-2 RNA is generally detectable in upper and lower respiratory specimens during the acute phase of infection. Negative results do not preclude SARS-CoV-2 infection, do not rule out co-infections with other pathogens, and should not be used as the sole basis for treatment or other patient management decisions. Negative results must be combined with clinical observations, patient history, and epidemiological information. The expected result is Negative. Fact Sheet for Patients: SugarRoll.be Fact Sheet for Healthcare Providers: https://www.woods-mathews.com/ This test is not yet approved or cleared by the [place] FDA and  has been authorized for detection and/or diagnosis of SARS-CoV-2 by FDA under an Emergency Use Authorization (EUA). This EUA will remain  in effect (meaning this test can be used) for the duration of the COVID-19 declaration under Section 56 4(b)(1) of the Act, 21 U.S.C. section 360bbb-3(b)(1), unless the authorization is terminated or revoked sooner. Performed at [facility], [place] [address] [place], [place] [address]      Time coordinating discharge: Over 30 minutes  SIGNED:   Ezekiel Slocumb, DO [facility] 02/26/2019, 9:52 AM Pager [phone]  If 7PM-7AM, please contact night-coverage [email or web address] Password [email or web address]

## 2019-02-26 NOTE — Progress Notes (Signed)
Report called and given to [place] at [facility]. IV removed. Patient changed and ready for transport. They state that he is second in line.

## 2019-02-26 NOTE — TOC Transition Note (Signed)
Transition of Care [facility]) - CM/SW Discharge Note   Patient Details  Name: [name] MRN: [id number] Date of Birth: March 14, 1951  [facility] CM/SW Contact:  Su Hilt, RN Phone Number: 02/26/2019, 12:58 PM   Clinical Narrative:    Patient going to [facility] today via EMS, The wife is aware, The bedside Nurse to call report to [facility] place and call EMS when ready   Final next level of care: Skilled Nursing Facility Barriers to Discharge: Barriers Resolved   Patient Goals and CMS Choice Patient states their goals for this hospitalization and ongoing recovery are:: go home      Discharge Placement                       Discharge Plan and Services   Discharge Planning Services: CM Consult                                 Social Determinants of Health (SDOH) Interventions     Readmission Risk Interventions No flowsheet data found.

## 2019-02-27 ENCOUNTER — Encounter: Payer: Medicare PPO | Admitting: Occupational Therapy

## 2019-02-27 ENCOUNTER — Ambulatory Visit: Payer: Medicare PPO

## 2019-02-27 LAB — CULTURE, BLOOD (ROUTINE X 2)
Culture: NO GROWTH
Culture: NO GROWTH
Special Requests: ADEQUATE
Special Requests: ADEQUATE

## 2019-02-28 DIAGNOSIS — R1310 Dysphagia, unspecified: Secondary | ICD-10-CM | POA: Diagnosis not present

## 2019-02-28 DIAGNOSIS — R296 Repeated falls: Secondary | ICD-10-CM | POA: Diagnosis not present

## 2019-02-28 DIAGNOSIS — G20 Parkinson's disease: Secondary | ICD-10-CM | POA: Diagnosis not present

## 2019-02-28 DIAGNOSIS — S72001D Fracture of unspecified part of neck of right femur, subsequent encounter for closed fracture with routine healing: Secondary | ICD-10-CM | POA: Diagnosis not present

## 2019-03-04 ENCOUNTER — Ambulatory Visit: Payer: Medicare PPO

## 2019-03-04 ENCOUNTER — Ambulatory Visit: Payer: Medicare PPO | Admitting: Psychology

## 2019-03-04 ENCOUNTER — Encounter: Payer: Medicare PPO | Admitting: Occupational Therapy

## 2019-03-06 ENCOUNTER — Ambulatory Visit: Payer: Medicare PPO

## 2019-03-06 ENCOUNTER — Encounter: Payer: Medicare PPO | Admitting: Occupational Therapy

## 2019-03-06 DIAGNOSIS — R001 Bradycardia, unspecified: Secondary | ICD-10-CM | POA: Diagnosis not present

## 2019-03-06 DIAGNOSIS — R296 Repeated falls: Secondary | ICD-10-CM | POA: Diagnosis not present

## 2019-03-06 DIAGNOSIS — S72001D Fracture of unspecified part of neck of right femur, subsequent encounter for closed fracture with routine healing: Secondary | ICD-10-CM | POA: Diagnosis not present

## 2019-03-06 DIAGNOSIS — R7881 Bacteremia: Secondary | ICD-10-CM | POA: Diagnosis not present

## 2019-03-11 ENCOUNTER — Encounter: Payer: Medicare PPO | Admitting: Occupational Therapy

## 2019-03-11 ENCOUNTER — Ambulatory Visit: Payer: Medicare PPO

## 2019-03-13 ENCOUNTER — Encounter: Payer: Medicare PPO | Admitting: Occupational Therapy

## 2019-03-13 ENCOUNTER — Ambulatory Visit: Payer: Medicare PPO

## 2019-03-15 DIAGNOSIS — R001 Bradycardia, unspecified: Secondary | ICD-10-CM | POA: Diagnosis not present

## 2019-03-15 DIAGNOSIS — S72001D Fracture of unspecified part of neck of right femur, subsequent encounter for closed fracture with routine healing: Secondary | ICD-10-CM | POA: Diagnosis not present

## 2019-03-15 DIAGNOSIS — J690 Pneumonitis due to inhalation of food and vomit: Secondary | ICD-10-CM | POA: Diagnosis not present

## 2019-03-18 ENCOUNTER — Ambulatory Visit: Payer: Medicare PPO | Admitting: Psychology

## 2019-03-18 ENCOUNTER — Ambulatory Visit: Payer: Medicare PPO

## 2019-03-18 ENCOUNTER — Encounter: Payer: Medicare PPO | Admitting: Occupational Therapy

## 2019-03-18 ENCOUNTER — Encounter: Payer: Self-pay | Admitting: Family Medicine

## 2019-03-20 ENCOUNTER — Ambulatory Visit: Payer: Medicare PPO

## 2019-03-20 ENCOUNTER — Encounter: Payer: Medicare PPO | Admitting: Occupational Therapy

## 2019-03-21 DIAGNOSIS — S72141A Displaced intertrochanteric fracture of right femur, initial encounter for closed fracture: Secondary | ICD-10-CM | POA: Diagnosis not present

## 2019-03-25 ENCOUNTER — Encounter: Payer: Medicare PPO | Admitting: Occupational Therapy

## 2019-03-25 ENCOUNTER — Ambulatory Visit: Payer: Medicare PPO

## 2019-03-26 DIAGNOSIS — J690 Pneumonitis due to inhalation of food and vomit: Secondary | ICD-10-CM | POA: Diagnosis not present

## 2019-03-26 DIAGNOSIS — S72001D Fracture of unspecified part of neck of right femur, subsequent encounter for closed fracture with routine healing: Secondary | ICD-10-CM | POA: Diagnosis not present

## 2019-03-26 DIAGNOSIS — G20 Parkinson's disease: Secondary | ICD-10-CM | POA: Diagnosis not present

## 2019-03-26 DIAGNOSIS — R001 Bradycardia, unspecified: Secondary | ICD-10-CM | POA: Diagnosis not present

## 2019-03-27 ENCOUNTER — Encounter: Payer: Medicare PPO | Admitting: Occupational Therapy

## 2019-03-27 ENCOUNTER — Ambulatory Visit: Payer: Medicare PPO

## 2019-04-01 ENCOUNTER — Ambulatory Visit: Payer: Medicare PPO | Admitting: Psychology

## 2019-04-01 DIAGNOSIS — G20 Parkinson's disease: Secondary | ICD-10-CM | POA: Diagnosis not present

## 2019-04-01 DIAGNOSIS — F0280 Dementia in other diseases classified elsewhere without behavioral disturbance: Secondary | ICD-10-CM | POA: Diagnosis not present

## 2019-04-01 DIAGNOSIS — S72001D Fracture of unspecified part of neck of right femur, subsequent encounter for closed fracture with routine healing: Secondary | ICD-10-CM | POA: Diagnosis not present

## 2019-04-01 DIAGNOSIS — R001 Bradycardia, unspecified: Secondary | ICD-10-CM | POA: Diagnosis not present

## 2019-04-02 DIAGNOSIS — G20 Parkinson's disease: Secondary | ICD-10-CM | POA: Diagnosis not present

## 2019-04-02 DIAGNOSIS — F329 Major depressive disorder, single episode, unspecified: Secondary | ICD-10-CM | POA: Diagnosis not present

## 2019-04-02 DIAGNOSIS — M109 Gout, unspecified: Secondary | ICD-10-CM | POA: Diagnosis not present

## 2019-04-02 DIAGNOSIS — E46 Unspecified protein-calorie malnutrition: Secondary | ICD-10-CM | POA: Diagnosis not present

## 2019-04-02 DIAGNOSIS — S72141D Displaced intertrochanteric fracture of right femur, subsequent encounter for closed fracture with routine healing: Secondary | ICD-10-CM | POA: Diagnosis not present

## 2019-04-02 DIAGNOSIS — M48062 Spinal stenosis, lumbar region with neurogenic claudication: Secondary | ICD-10-CM | POA: Diagnosis not present

## 2019-04-02 DIAGNOSIS — I10 Essential (primary) hypertension: Secondary | ICD-10-CM | POA: Diagnosis not present

## 2019-04-02 DIAGNOSIS — F0280 Dementia in other diseases classified elsewhere without behavioral disturbance: Secondary | ICD-10-CM | POA: Diagnosis not present

## 2019-04-02 DIAGNOSIS — F4310 Post-traumatic stress disorder, unspecified: Secondary | ICD-10-CM | POA: Diagnosis not present

## 2019-04-03 ENCOUNTER — Ambulatory Visit: Payer: Medicare PPO

## 2019-04-03 ENCOUNTER — Encounter: Payer: Medicare PPO | Admitting: Occupational Therapy

## 2019-04-04 DIAGNOSIS — F329 Major depressive disorder, single episode, unspecified: Secondary | ICD-10-CM | POA: Diagnosis not present

## 2019-04-04 DIAGNOSIS — M109 Gout, unspecified: Secondary | ICD-10-CM | POA: Diagnosis not present

## 2019-04-04 DIAGNOSIS — I10 Essential (primary) hypertension: Secondary | ICD-10-CM | POA: Diagnosis not present

## 2019-04-04 DIAGNOSIS — F4310 Post-traumatic stress disorder, unspecified: Secondary | ICD-10-CM | POA: Diagnosis not present

## 2019-04-04 DIAGNOSIS — E46 Unspecified protein-calorie malnutrition: Secondary | ICD-10-CM | POA: Diagnosis not present

## 2019-04-04 DIAGNOSIS — S72141D Displaced intertrochanteric fracture of right femur, subsequent encounter for closed fracture with routine healing: Secondary | ICD-10-CM | POA: Diagnosis not present

## 2019-04-04 DIAGNOSIS — M48062 Spinal stenosis, lumbar region with neurogenic claudication: Secondary | ICD-10-CM | POA: Diagnosis not present

## 2019-04-04 DIAGNOSIS — G20 Parkinson's disease: Secondary | ICD-10-CM | POA: Diagnosis not present

## 2019-04-04 DIAGNOSIS — F0280 Dementia in other diseases classified elsewhere without behavioral disturbance: Secondary | ICD-10-CM | POA: Diagnosis not present

## 2019-04-06 DIAGNOSIS — F4310 Post-traumatic stress disorder, unspecified: Secondary | ICD-10-CM | POA: Diagnosis not present

## 2019-04-06 DIAGNOSIS — F0280 Dementia in other diseases classified elsewhere without behavioral disturbance: Secondary | ICD-10-CM | POA: Diagnosis not present

## 2019-04-06 DIAGNOSIS — I10 Essential (primary) hypertension: Secondary | ICD-10-CM | POA: Diagnosis not present

## 2019-04-06 DIAGNOSIS — F329 Major depressive disorder, single episode, unspecified: Secondary | ICD-10-CM | POA: Diagnosis not present

## 2019-04-06 DIAGNOSIS — E46 Unspecified protein-calorie malnutrition: Secondary | ICD-10-CM | POA: Diagnosis not present

## 2019-04-06 DIAGNOSIS — S72141D Displaced intertrochanteric fracture of right femur, subsequent encounter for closed fracture with routine healing: Secondary | ICD-10-CM | POA: Diagnosis not present

## 2019-04-06 DIAGNOSIS — G20 Parkinson's disease: Secondary | ICD-10-CM | POA: Diagnosis not present

## 2019-04-06 DIAGNOSIS — M48062 Spinal stenosis, lumbar region with neurogenic claudication: Secondary | ICD-10-CM | POA: Diagnosis not present

## 2019-04-06 DIAGNOSIS — M109 Gout, unspecified: Secondary | ICD-10-CM | POA: Diagnosis not present

## 2019-04-07 DIAGNOSIS — M109 Gout, unspecified: Secondary | ICD-10-CM | POA: Diagnosis not present

## 2019-04-07 DIAGNOSIS — M48062 Spinal stenosis, lumbar region with neurogenic claudication: Secondary | ICD-10-CM | POA: Diagnosis not present

## 2019-04-07 DIAGNOSIS — G20 Parkinson's disease: Secondary | ICD-10-CM | POA: Diagnosis not present

## 2019-04-07 DIAGNOSIS — F0280 Dementia in other diseases classified elsewhere without behavioral disturbance: Secondary | ICD-10-CM | POA: Diagnosis not present

## 2019-04-07 DIAGNOSIS — E785 Hyperlipidemia, unspecified: Secondary | ICD-10-CM

## 2019-04-07 DIAGNOSIS — F4310 Post-traumatic stress disorder, unspecified: Secondary | ICD-10-CM | POA: Diagnosis not present

## 2019-04-07 DIAGNOSIS — E46 Unspecified protein-calorie malnutrition: Secondary | ICD-10-CM | POA: Diagnosis not present

## 2019-04-07 DIAGNOSIS — F329 Major depressive disorder, single episode, unspecified: Secondary | ICD-10-CM | POA: Diagnosis not present

## 2019-04-07 DIAGNOSIS — G4733 Obstructive sleep apnea (adult) (pediatric): Secondary | ICD-10-CM

## 2019-04-07 DIAGNOSIS — K219 Gastro-esophageal reflux disease without esophagitis: Secondary | ICD-10-CM

## 2019-04-07 DIAGNOSIS — Z8701 Personal history of pneumonia (recurrent): Secondary | ICD-10-CM

## 2019-04-07 DIAGNOSIS — I10 Essential (primary) hypertension: Secondary | ICD-10-CM | POA: Diagnosis not present

## 2019-04-07 DIAGNOSIS — Z9181 History of falling: Secondary | ICD-10-CM

## 2019-04-07 DIAGNOSIS — S72141D Displaced intertrochanteric fracture of right femur, subsequent encounter for closed fracture with routine healing: Secondary | ICD-10-CM | POA: Diagnosis not present

## 2019-04-08 ENCOUNTER — Encounter: Payer: Medicare PPO | Admitting: Occupational Therapy

## 2019-04-08 ENCOUNTER — Encounter: Payer: Self-pay | Admitting: Family Medicine

## 2019-04-08 ENCOUNTER — Ambulatory Visit (INDEPENDENT_AMBULATORY_CARE_PROVIDER_SITE_OTHER): Payer: Medicare PPO | Admitting: Family Medicine

## 2019-04-08 ENCOUNTER — Ambulatory Visit: Payer: Medicare PPO

## 2019-04-08 DIAGNOSIS — S72141D Displaced intertrochanteric fracture of right femur, subsequent encounter for closed fracture with routine healing: Secondary | ICD-10-CM | POA: Diagnosis not present

## 2019-04-08 DIAGNOSIS — E46 Unspecified protein-calorie malnutrition: Secondary | ICD-10-CM | POA: Diagnosis not present

## 2019-04-08 DIAGNOSIS — F329 Major depressive disorder, single episode, unspecified: Secondary | ICD-10-CM | POA: Diagnosis not present

## 2019-04-08 DIAGNOSIS — F4310 Post-traumatic stress disorder, unspecified: Secondary | ICD-10-CM | POA: Diagnosis not present

## 2019-04-08 DIAGNOSIS — I10 Essential (primary) hypertension: Secondary | ICD-10-CM | POA: Diagnosis not present

## 2019-04-08 DIAGNOSIS — S72001D Fracture of unspecified part of neck of right femur, subsequent encounter for closed fracture with routine healing: Secondary | ICD-10-CM

## 2019-04-08 DIAGNOSIS — M109 Gout, unspecified: Secondary | ICD-10-CM | POA: Diagnosis not present

## 2019-04-08 DIAGNOSIS — M48062 Spinal stenosis, lumbar region with neurogenic claudication: Secondary | ICD-10-CM | POA: Diagnosis not present

## 2019-04-08 DIAGNOSIS — F0280 Dementia in other diseases classified elsewhere without behavioral disturbance: Secondary | ICD-10-CM | POA: Diagnosis not present

## 2019-04-08 DIAGNOSIS — G20 Parkinson's disease: Secondary | ICD-10-CM | POA: Diagnosis not present

## 2019-04-08 MED ORDER — CARBIDOPA-LEVODOPA 25-100 MG PO TABS: ORAL_TABLET | ORAL | Status: DC

## 2019-04-08 NOTE — Progress Notes (Signed)
Interactive audio and video telecommunications were attempted between this provider and patient, however failed, due to patient having technical difficulties OR patient did not have access to video capability.  We continued and completed visit with audio only.   Virtual Visit via Telephone Note  I connected with patient on 04/08/19  at 4:38 PM  by telephone and verified that I am speaking with the correct person using two identifiers.  Location of patient: home.   Location of MD: [facility] Name of referring provider (if blank then none associated): Names per persons and role in encounter:  MD: Earlyne Iba, Patient: name listed above.    I discussed the limitations, risks, security and privacy concerns of performing an evaluation and management service by telephone and the availability of in person appointments. I also discussed with the patient that there may be a patient responsible charge related to this service. The patient expressed understanding and agreed to proceed.  CC: f/u.    History of Present Illness:   Inpatient course and surgery discussed with patient.  He was admitted to the inpatient service and then discharged to skilled rehab. He is off pravastatin and losartan in the meantime. He has pain on standing but he is able to stand.  He has more pain if in the same position for too long.  Discussed ice vs heat with tylenol prn for pain.  Then ibuprofen prn with food.   He may have had UTI prior to fall.  His mentation improved after the fall and with treatment.   He had trouble getting changed while at SNF and this was clearly disappointing.  Covid considerations d/w pt.  He got out of SNF w/o getting covid.  Social work at the SNF were not always available to help set up home health.  In the meantime, he is now at home with wife.  HH is now coming out, as of last week. RN/PT/OT/aide so far.    They have bathroom remodeled at home now.  That is helpful.   He is occ  incontinent of bowels at baseline.  Pt/wife are going to get help from OT/PT about that.  Goal for regular PT at hospital when possible, with covid considerations discussed.   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  We decided that we can check on that later on, as the pandemic allows.   Observations/Objective:  No apparent distress Speech normal.  Assessment and Plan: Status post inpatient repair of right hip fracture status post SNF placement, now back home with outpatient home health therapy.  He does not have Covid symptoms.  Discussed options for pain.  Discussed ice vs heat with tylenol prn for pain.  Then ibuprofen prn with food.  He does not tolerate opiates well.  This is likely the best option for now.  Covid cautions discussed.  Family will update me as needed.  At this point still okay for outpatient/phone follow-up.  I appreciate the help of all involved  Follow Up Instructions: See above.   I discussed the assessment and treatment plan with the patient. The patient was provided an opportunity to ask questions and all were answered. The patient agreed with the plan and demonstrated an understanding of the instructions.   The patient was advised to call back or seek an in-person evaluation if the symptoms worsen or if the condition fails to improve as anticipated.  I provided 29 minutes of non-face-to-face time during this encounter.  Elsie Stain, MD

## 2019-04-09 DIAGNOSIS — S72141D Displaced intertrochanteric fracture of right femur, subsequent encounter for closed fracture with routine healing: Secondary | ICD-10-CM | POA: Diagnosis not present

## 2019-04-09 DIAGNOSIS — I10 Essential (primary) hypertension: Secondary | ICD-10-CM | POA: Diagnosis not present

## 2019-04-09 DIAGNOSIS — M48062 Spinal stenosis, lumbar region with neurogenic claudication: Secondary | ICD-10-CM | POA: Diagnosis not present

## 2019-04-09 DIAGNOSIS — F329 Major depressive disorder, single episode, unspecified: Secondary | ICD-10-CM | POA: Diagnosis not present

## 2019-04-09 DIAGNOSIS — G20 Parkinson's disease: Secondary | ICD-10-CM | POA: Diagnosis not present

## 2019-04-09 DIAGNOSIS — F0280 Dementia in other diseases classified elsewhere without behavioral disturbance: Secondary | ICD-10-CM | POA: Diagnosis not present

## 2019-04-09 DIAGNOSIS — E46 Unspecified protein-calorie malnutrition: Secondary | ICD-10-CM | POA: Diagnosis not present

## 2019-04-09 DIAGNOSIS — M109 Gout, unspecified: Secondary | ICD-10-CM | POA: Diagnosis not present

## 2019-04-09 DIAGNOSIS — F4310 Post-traumatic stress disorder, unspecified: Secondary | ICD-10-CM | POA: Diagnosis not present

## 2019-04-10 ENCOUNTER — Encounter: Payer: Medicare PPO | Admitting: Occupational Therapy

## 2019-04-10 ENCOUNTER — Ambulatory Visit: Payer: Medicare PPO

## 2019-04-10 DIAGNOSIS — M48062 Spinal stenosis, lumbar region with neurogenic claudication: Secondary | ICD-10-CM | POA: Diagnosis not present

## 2019-04-10 DIAGNOSIS — E46 Unspecified protein-calorie malnutrition: Secondary | ICD-10-CM | POA: Diagnosis not present

## 2019-04-10 DIAGNOSIS — F4310 Post-traumatic stress disorder, unspecified: Secondary | ICD-10-CM | POA: Diagnosis not present

## 2019-04-10 DIAGNOSIS — G20 Parkinson's disease: Secondary | ICD-10-CM | POA: Diagnosis not present

## 2019-04-10 DIAGNOSIS — F329 Major depressive disorder, single episode, unspecified: Secondary | ICD-10-CM | POA: Diagnosis not present

## 2019-04-10 DIAGNOSIS — F0280 Dementia in other diseases classified elsewhere without behavioral disturbance: Secondary | ICD-10-CM | POA: Diagnosis not present

## 2019-04-10 DIAGNOSIS — S72141D Displaced intertrochanteric fracture of right femur, subsequent encounter for closed fracture with routine healing: Secondary | ICD-10-CM | POA: Diagnosis not present

## 2019-04-10 DIAGNOSIS — M109 Gout, unspecified: Secondary | ICD-10-CM | POA: Diagnosis not present

## 2019-04-10 DIAGNOSIS — I10 Essential (primary) hypertension: Secondary | ICD-10-CM | POA: Diagnosis not present

## 2019-04-10 NOTE — Assessment & Plan Note (Signed)
Status post inpatient repair of right hip fracture status post SNF placement, now back home with outpatient home health therapy.  He does not have Covid symptoms.  Discussed options for pain.  Discussed ice vs heat with tylenol prn for pain.  Then ibuprofen prn with food.  He does not tolerate opiates well.  This is likely the best option for now.  Covid cautions discussed.  Family will update me as needed.  At this point still okay for outpatient/phone follow-up.  I appreciate the help of all involved

## 2019-04-11 DIAGNOSIS — M48062 Spinal stenosis, lumbar region with neurogenic claudication: Secondary | ICD-10-CM | POA: Diagnosis not present

## 2019-04-11 DIAGNOSIS — E46 Unspecified protein-calorie malnutrition: Secondary | ICD-10-CM | POA: Diagnosis not present

## 2019-04-11 DIAGNOSIS — F4310 Post-traumatic stress disorder, unspecified: Secondary | ICD-10-CM | POA: Diagnosis not present

## 2019-04-11 DIAGNOSIS — M109 Gout, unspecified: Secondary | ICD-10-CM | POA: Diagnosis not present

## 2019-04-11 DIAGNOSIS — F0280 Dementia in other diseases classified elsewhere without behavioral disturbance: Secondary | ICD-10-CM | POA: Diagnosis not present

## 2019-04-11 DIAGNOSIS — G20 Parkinson's disease: Secondary | ICD-10-CM | POA: Diagnosis not present

## 2019-04-11 DIAGNOSIS — F329 Major depressive disorder, single episode, unspecified: Secondary | ICD-10-CM | POA: Diagnosis not present

## 2019-04-11 DIAGNOSIS — S72141D Displaced intertrochanteric fracture of right femur, subsequent encounter for closed fracture with routine healing: Secondary | ICD-10-CM | POA: Diagnosis not present

## 2019-04-11 DIAGNOSIS — I10 Essential (primary) hypertension: Secondary | ICD-10-CM | POA: Diagnosis not present

## 2019-04-12 DIAGNOSIS — R05 Cough: Secondary | ICD-10-CM | POA: Diagnosis not present

## 2019-04-12 DIAGNOSIS — Z20828 Contact with and (suspected) exposure to other viral communicable diseases: Secondary | ICD-10-CM | POA: Diagnosis not present

## 2019-04-15 ENCOUNTER — Ambulatory Visit (INDEPENDENT_AMBULATORY_CARE_PROVIDER_SITE_OTHER): Payer: Medicare PPO | Admitting: Psychology

## 2019-04-15 DIAGNOSIS — F4323 Adjustment disorder with mixed anxiety and depressed mood: Secondary | ICD-10-CM | POA: Diagnosis not present

## 2019-04-15 DIAGNOSIS — F331 Major depressive disorder, recurrent, moderate: Secondary | ICD-10-CM | POA: Diagnosis not present

## 2019-04-16 ENCOUNTER — Telehealth: Payer: Self-pay

## 2019-04-16 NOTE — Telephone Encounter (Signed)
I agree with all of that.  Please update [place] as needed.  Thanks.

## 2019-04-16 NOTE — Telephone Encounter (Signed)
[name] (DPR signed) said pt had positive covid test on 04/15/19. No fever or chills,weakness,H/A, and no body aches, no rash and no SOB, no abd pain, due to parkinson pt does not have taste or smell, no red eye,S/Tor runny nose,no bruising bleeding,fatigue or vomiting. pt had diarrhea on 04/15/19;none today. Pt has dry cough.no travel; pt was d/c from [facility] on 04/01/19 and there had been an outbreak of covid at [facility]. Advised pt to stay hydrated, rest, and take tylenol for fever and body aches. UC & ED precautions given and pt's wife voiced understanding. Request cb if Dr Damita Dunnings has other suggestions of what pt should do or take.

## 2019-04-16 NOTE — Telephone Encounter (Signed)
Wife advised. 

## 2019-04-17 ENCOUNTER — Encounter: Payer: Medicare PPO | Admitting: Occupational Therapy

## 2019-04-17 ENCOUNTER — Ambulatory Visit: Payer: Medicare PPO

## 2019-04-25 DIAGNOSIS — M48062 Spinal stenosis, lumbar region with neurogenic claudication: Secondary | ICD-10-CM | POA: Diagnosis not present

## 2019-04-25 DIAGNOSIS — G20 Parkinson's disease: Secondary | ICD-10-CM | POA: Diagnosis not present

## 2019-04-25 DIAGNOSIS — E46 Unspecified protein-calorie malnutrition: Secondary | ICD-10-CM | POA: Diagnosis not present

## 2019-04-25 DIAGNOSIS — F4310 Post-traumatic stress disorder, unspecified: Secondary | ICD-10-CM | POA: Diagnosis not present

## 2019-04-25 DIAGNOSIS — M109 Gout, unspecified: Secondary | ICD-10-CM | POA: Diagnosis not present

## 2019-04-25 DIAGNOSIS — S72141D Displaced intertrochanteric fracture of right femur, subsequent encounter for closed fracture with routine healing: Secondary | ICD-10-CM | POA: Diagnosis not present

## 2019-04-25 DIAGNOSIS — I10 Essential (primary) hypertension: Secondary | ICD-10-CM | POA: Diagnosis not present

## 2019-04-25 DIAGNOSIS — F0280 Dementia in other diseases classified elsewhere without behavioral disturbance: Secondary | ICD-10-CM | POA: Diagnosis not present

## 2019-04-25 DIAGNOSIS — F329 Major depressive disorder, single episode, unspecified: Secondary | ICD-10-CM | POA: Diagnosis not present

## 2019-04-26 ENCOUNTER — Telehealth: Payer: Self-pay

## 2019-04-26 DIAGNOSIS — E46 Unspecified protein-calorie malnutrition: Secondary | ICD-10-CM | POA: Diagnosis not present

## 2019-04-26 DIAGNOSIS — F4310 Post-traumatic stress disorder, unspecified: Secondary | ICD-10-CM | POA: Diagnosis not present

## 2019-04-26 DIAGNOSIS — F329 Major depressive disorder, single episode, unspecified: Secondary | ICD-10-CM | POA: Diagnosis not present

## 2019-04-26 DIAGNOSIS — F0280 Dementia in other diseases classified elsewhere without behavioral disturbance: Secondary | ICD-10-CM | POA: Diagnosis not present

## 2019-04-26 DIAGNOSIS — M109 Gout, unspecified: Secondary | ICD-10-CM | POA: Diagnosis not present

## 2019-04-26 DIAGNOSIS — M48062 Spinal stenosis, lumbar region with neurogenic claudication: Secondary | ICD-10-CM | POA: Diagnosis not present

## 2019-04-26 DIAGNOSIS — G20 Parkinson's disease: Secondary | ICD-10-CM | POA: Diagnosis not present

## 2019-04-26 DIAGNOSIS — S72141D Displaced intertrochanteric fracture of right femur, subsequent encounter for closed fracture with routine healing: Secondary | ICD-10-CM | POA: Diagnosis not present

## 2019-04-26 DIAGNOSIS — I10 Essential (primary) hypertension: Secondary | ICD-10-CM | POA: Diagnosis not present

## 2019-04-26 NOTE — Telephone Encounter (Signed)
[name] from [facility] is needing Verbal orders for continuation of OT for 1x week for 3 weeks. Ok to leave VM on personal VM at [phone]

## 2019-04-26 NOTE — Telephone Encounter (Signed)
Left detailed message on voicemail for [facility] as instructed. Verbal order given per Dr. Damita Dunnings.

## 2019-04-26 NOTE — Telephone Encounter (Signed)
Please give the order.  Thanks.   

## 2019-04-29 ENCOUNTER — Ambulatory Visit (INDEPENDENT_AMBULATORY_CARE_PROVIDER_SITE_OTHER): Payer: Medicare PPO | Admitting: Psychology

## 2019-04-29 DIAGNOSIS — F4323 Adjustment disorder with mixed anxiety and depressed mood: Secondary | ICD-10-CM

## 2019-04-29 DIAGNOSIS — F331 Major depressive disorder, recurrent, moderate: Secondary | ICD-10-CM | POA: Diagnosis not present

## 2019-04-30 DIAGNOSIS — M48062 Spinal stenosis, lumbar region with neurogenic claudication: Secondary | ICD-10-CM | POA: Diagnosis not present

## 2019-04-30 DIAGNOSIS — S72141D Displaced intertrochanteric fracture of right femur, subsequent encounter for closed fracture with routine healing: Secondary | ICD-10-CM | POA: Diagnosis not present

## 2019-04-30 DIAGNOSIS — I10 Essential (primary) hypertension: Secondary | ICD-10-CM | POA: Diagnosis not present

## 2019-04-30 DIAGNOSIS — G20 Parkinson's disease: Secondary | ICD-10-CM | POA: Diagnosis not present

## 2019-04-30 DIAGNOSIS — F329 Major depressive disorder, single episode, unspecified: Secondary | ICD-10-CM | POA: Diagnosis not present

## 2019-04-30 DIAGNOSIS — M109 Gout, unspecified: Secondary | ICD-10-CM | POA: Diagnosis not present

## 2019-04-30 DIAGNOSIS — F0280 Dementia in other diseases classified elsewhere without behavioral disturbance: Secondary | ICD-10-CM | POA: Diagnosis not present

## 2019-04-30 DIAGNOSIS — E46 Unspecified protein-calorie malnutrition: Secondary | ICD-10-CM | POA: Diagnosis not present

## 2019-04-30 DIAGNOSIS — F4310 Post-traumatic stress disorder, unspecified: Secondary | ICD-10-CM | POA: Diagnosis not present

## 2019-05-02 DIAGNOSIS — I10 Essential (primary) hypertension: Secondary | ICD-10-CM | POA: Diagnosis not present

## 2019-05-02 DIAGNOSIS — G20 Parkinson's disease: Secondary | ICD-10-CM | POA: Diagnosis not present

## 2019-05-02 DIAGNOSIS — E46 Unspecified protein-calorie malnutrition: Secondary | ICD-10-CM | POA: Diagnosis not present

## 2019-05-02 DIAGNOSIS — F0280 Dementia in other diseases classified elsewhere without behavioral disturbance: Secondary | ICD-10-CM | POA: Diagnosis not present

## 2019-05-02 DIAGNOSIS — F329 Major depressive disorder, single episode, unspecified: Secondary | ICD-10-CM | POA: Diagnosis not present

## 2019-05-02 DIAGNOSIS — M48062 Spinal stenosis, lumbar region with neurogenic claudication: Secondary | ICD-10-CM | POA: Diagnosis not present

## 2019-05-02 DIAGNOSIS — S72141D Displaced intertrochanteric fracture of right femur, subsequent encounter for closed fracture with routine healing: Secondary | ICD-10-CM | POA: Diagnosis not present

## 2019-05-02 DIAGNOSIS — F4310 Post-traumatic stress disorder, unspecified: Secondary | ICD-10-CM | POA: Diagnosis not present

## 2019-05-02 DIAGNOSIS — M109 Gout, unspecified: Secondary | ICD-10-CM | POA: Diagnosis not present

## 2019-05-03 DIAGNOSIS — G4752 REM sleep behavior disorder: Secondary | ICD-10-CM | POA: Diagnosis not present

## 2019-05-03 DIAGNOSIS — G3184 Mild cognitive impairment, so stated: Secondary | ICD-10-CM | POA: Diagnosis not present

## 2019-05-03 DIAGNOSIS — G20 Parkinson's disease: Secondary | ICD-10-CM | POA: Diagnosis not present

## 2019-05-03 DIAGNOSIS — Z9689 Presence of other specified functional implants: Secondary | ICD-10-CM | POA: Diagnosis not present

## 2019-05-03 DIAGNOSIS — Z8616 Personal history of COVID-19: Secondary | ICD-10-CM | POA: Diagnosis not present

## 2019-05-03 DIAGNOSIS — Z79899 Other long term (current) drug therapy: Secondary | ICD-10-CM | POA: Diagnosis not present

## 2019-05-03 DIAGNOSIS — R262 Difficulty in walking, not elsewhere classified: Secondary | ICD-10-CM | POA: Diagnosis not present

## 2019-05-03 DIAGNOSIS — Z462 Encounter for fitting and adjustment of other devices related to nervous system and special senses: Secondary | ICD-10-CM | POA: Diagnosis not present

## 2019-05-03 DIAGNOSIS — R2689 Other abnormalities of gait and mobility: Secondary | ICD-10-CM | POA: Diagnosis not present

## 2019-05-08 DIAGNOSIS — M109 Gout, unspecified: Secondary | ICD-10-CM | POA: Diagnosis not present

## 2019-05-08 DIAGNOSIS — I10 Essential (primary) hypertension: Secondary | ICD-10-CM | POA: Diagnosis not present

## 2019-05-08 DIAGNOSIS — E46 Unspecified protein-calorie malnutrition: Secondary | ICD-10-CM | POA: Diagnosis not present

## 2019-05-08 DIAGNOSIS — F4310 Post-traumatic stress disorder, unspecified: Secondary | ICD-10-CM | POA: Diagnosis not present

## 2019-05-08 DIAGNOSIS — G20 Parkinson's disease: Secondary | ICD-10-CM | POA: Diagnosis not present

## 2019-05-08 DIAGNOSIS — M48062 Spinal stenosis, lumbar region with neurogenic claudication: Secondary | ICD-10-CM | POA: Diagnosis not present

## 2019-05-08 DIAGNOSIS — F0280 Dementia in other diseases classified elsewhere without behavioral disturbance: Secondary | ICD-10-CM | POA: Diagnosis not present

## 2019-05-08 DIAGNOSIS — S72141D Displaced intertrochanteric fracture of right femur, subsequent encounter for closed fracture with routine healing: Secondary | ICD-10-CM | POA: Diagnosis not present

## 2019-05-08 DIAGNOSIS — F329 Major depressive disorder, single episode, unspecified: Secondary | ICD-10-CM | POA: Diagnosis not present

## 2019-05-09 DIAGNOSIS — F329 Major depressive disorder, single episode, unspecified: Secondary | ICD-10-CM | POA: Diagnosis not present

## 2019-05-09 DIAGNOSIS — F0280 Dementia in other diseases classified elsewhere without behavioral disturbance: Secondary | ICD-10-CM | POA: Diagnosis not present

## 2019-05-09 DIAGNOSIS — M48062 Spinal stenosis, lumbar region with neurogenic claudication: Secondary | ICD-10-CM | POA: Diagnosis not present

## 2019-05-09 DIAGNOSIS — F4310 Post-traumatic stress disorder, unspecified: Secondary | ICD-10-CM | POA: Diagnosis not present

## 2019-05-09 DIAGNOSIS — M109 Gout, unspecified: Secondary | ICD-10-CM | POA: Diagnosis not present

## 2019-05-09 DIAGNOSIS — G20 Parkinson's disease: Secondary | ICD-10-CM | POA: Diagnosis not present

## 2019-05-09 DIAGNOSIS — E46 Unspecified protein-calorie malnutrition: Secondary | ICD-10-CM | POA: Diagnosis not present

## 2019-05-09 DIAGNOSIS — I10 Essential (primary) hypertension: Secondary | ICD-10-CM | POA: Diagnosis not present

## 2019-05-09 DIAGNOSIS — S72141D Displaced intertrochanteric fracture of right femur, subsequent encounter for closed fracture with routine healing: Secondary | ICD-10-CM | POA: Diagnosis not present

## 2019-05-13 ENCOUNTER — Ambulatory Visit: Payer: Medicare PPO | Admitting: Psychology

## 2019-05-14 DIAGNOSIS — I10 Essential (primary) hypertension: Secondary | ICD-10-CM | POA: Diagnosis not present

## 2019-05-14 DIAGNOSIS — M109 Gout, unspecified: Secondary | ICD-10-CM | POA: Diagnosis not present

## 2019-05-14 DIAGNOSIS — F329 Major depressive disorder, single episode, unspecified: Secondary | ICD-10-CM | POA: Diagnosis not present

## 2019-05-14 DIAGNOSIS — G20 Parkinson's disease: Secondary | ICD-10-CM | POA: Diagnosis not present

## 2019-05-14 DIAGNOSIS — M48062 Spinal stenosis, lumbar region with neurogenic claudication: Secondary | ICD-10-CM | POA: Diagnosis not present

## 2019-05-14 DIAGNOSIS — F4310 Post-traumatic stress disorder, unspecified: Secondary | ICD-10-CM | POA: Diagnosis not present

## 2019-05-14 DIAGNOSIS — E46 Unspecified protein-calorie malnutrition: Secondary | ICD-10-CM | POA: Diagnosis not present

## 2019-05-14 DIAGNOSIS — S72141D Displaced intertrochanteric fracture of right femur, subsequent encounter for closed fracture with routine healing: Secondary | ICD-10-CM | POA: Diagnosis not present

## 2019-05-14 DIAGNOSIS — F0280 Dementia in other diseases classified elsewhere without behavioral disturbance: Secondary | ICD-10-CM | POA: Diagnosis not present

## 2019-05-15 DIAGNOSIS — S72141A Displaced intertrochanteric fracture of right femur, initial encounter for closed fracture: Secondary | ICD-10-CM | POA: Diagnosis not present

## 2019-05-17 DIAGNOSIS — M109 Gout, unspecified: Secondary | ICD-10-CM | POA: Diagnosis not present

## 2019-05-17 DIAGNOSIS — F329 Major depressive disorder, single episode, unspecified: Secondary | ICD-10-CM | POA: Diagnosis not present

## 2019-05-17 DIAGNOSIS — G20 Parkinson's disease: Secondary | ICD-10-CM | POA: Diagnosis not present

## 2019-05-17 DIAGNOSIS — E46 Unspecified protein-calorie malnutrition: Secondary | ICD-10-CM | POA: Diagnosis not present

## 2019-05-17 DIAGNOSIS — F4310 Post-traumatic stress disorder, unspecified: Secondary | ICD-10-CM | POA: Diagnosis not present

## 2019-05-17 DIAGNOSIS — S72141D Displaced intertrochanteric fracture of right femur, subsequent encounter for closed fracture with routine healing: Secondary | ICD-10-CM | POA: Diagnosis not present

## 2019-05-17 DIAGNOSIS — M48062 Spinal stenosis, lumbar region with neurogenic claudication: Secondary | ICD-10-CM | POA: Diagnosis not present

## 2019-05-17 DIAGNOSIS — F0280 Dementia in other diseases classified elsewhere without behavioral disturbance: Secondary | ICD-10-CM | POA: Diagnosis not present

## 2019-05-17 DIAGNOSIS — I10 Essential (primary) hypertension: Secondary | ICD-10-CM | POA: Diagnosis not present

## 2019-05-27 ENCOUNTER — Ambulatory Visit (INDEPENDENT_AMBULATORY_CARE_PROVIDER_SITE_OTHER): Payer: Medicare PPO | Admitting: Psychology

## 2019-05-27 DIAGNOSIS — F4323 Adjustment disorder with mixed anxiety and depressed mood: Secondary | ICD-10-CM | POA: Diagnosis not present

## 2019-05-27 DIAGNOSIS — F331 Major depressive disorder, recurrent, moderate: Secondary | ICD-10-CM | POA: Diagnosis not present

## 2019-06-10 ENCOUNTER — Ambulatory Visit: Payer: Medicare PPO | Admitting: Psychology

## 2019-06-18 ENCOUNTER — Other Ambulatory Visit: Payer: Self-pay | Admitting: Family Medicine

## 2019-06-24 ENCOUNTER — Ambulatory Visit (INDEPENDENT_AMBULATORY_CARE_PROVIDER_SITE_OTHER): Payer: Medicare PPO | Admitting: Psychology

## 2019-06-24 DIAGNOSIS — F331 Major depressive disorder, recurrent, moderate: Secondary | ICD-10-CM | POA: Diagnosis not present

## 2019-06-24 DIAGNOSIS — F4323 Adjustment disorder with mixed anxiety and depressed mood: Secondary | ICD-10-CM | POA: Diagnosis not present

## 2019-07-08 ENCOUNTER — Ambulatory Visit (INDEPENDENT_AMBULATORY_CARE_PROVIDER_SITE_OTHER): Payer: Medicare PPO | Admitting: Psychology

## 2019-07-08 DIAGNOSIS — F4323 Adjustment disorder with mixed anxiety and depressed mood: Secondary | ICD-10-CM | POA: Diagnosis not present

## 2019-07-08 DIAGNOSIS — F331 Major depressive disorder, recurrent, moderate: Secondary | ICD-10-CM | POA: Diagnosis not present

## 2019-07-10 DIAGNOSIS — H524 Presbyopia: Secondary | ICD-10-CM | POA: Diagnosis not present

## 2019-07-10 DIAGNOSIS — H52203 Unspecified astigmatism, bilateral: Secondary | ICD-10-CM | POA: Diagnosis not present

## 2019-07-10 DIAGNOSIS — H04123 Dry eye syndrome of bilateral lacrimal glands: Secondary | ICD-10-CM | POA: Diagnosis not present

## 2019-07-10 DIAGNOSIS — H2513 Age-related nuclear cataract, bilateral: Secondary | ICD-10-CM | POA: Diagnosis not present

## 2019-07-10 DIAGNOSIS — Z8669 Personal history of other diseases of the nervous system and sense organs: Secondary | ICD-10-CM | POA: Diagnosis not present

## 2019-07-10 DIAGNOSIS — H5111 Convergence insufficiency: Secondary | ICD-10-CM | POA: Diagnosis not present

## 2019-07-10 DIAGNOSIS — D3132 Benign neoplasm of left choroid: Secondary | ICD-10-CM | POA: Diagnosis not present

## 2019-07-22 ENCOUNTER — Ambulatory Visit: Payer: Medicare PPO | Admitting: Psychology

## 2019-07-30 ENCOUNTER — Other Ambulatory Visit: Payer: Self-pay | Admitting: Family Medicine

## 2019-08-02 DIAGNOSIS — R2241 Localized swelling, mass and lump, right lower limb: Secondary | ICD-10-CM | POA: Diagnosis not present

## 2019-08-02 DIAGNOSIS — M7541 Impingement syndrome of right shoulder: Secondary | ICD-10-CM | POA: Diagnosis not present

## 2019-08-02 DIAGNOSIS — M5416 Radiculopathy, lumbar region: Secondary | ICD-10-CM | POA: Diagnosis not present

## 2019-08-02 DIAGNOSIS — S72141A Displaced intertrochanteric fracture of right femur, initial encounter for closed fracture: Secondary | ICD-10-CM | POA: Diagnosis not present

## 2019-08-05 ENCOUNTER — Encounter: Payer: Self-pay | Admitting: Family

## 2019-08-05 ENCOUNTER — Ambulatory Visit (INDEPENDENT_AMBULATORY_CARE_PROVIDER_SITE_OTHER): Payer: Medicare PPO | Admitting: Psychology

## 2019-08-05 ENCOUNTER — Ambulatory Visit (INDEPENDENT_AMBULATORY_CARE_PROVIDER_SITE_OTHER): Payer: Medicare PPO | Admitting: Family

## 2019-08-05 ENCOUNTER — Other Ambulatory Visit: Payer: Self-pay

## 2019-08-05 VITALS — BP 170/90 | HR 43 | Ht 69.0 in | Wt 187.1 lb

## 2019-08-05 DIAGNOSIS — E782 Mixed hyperlipidemia: Secondary | ICD-10-CM

## 2019-08-05 DIAGNOSIS — R001 Bradycardia, unspecified: Secondary | ICD-10-CM | POA: Diagnosis not present

## 2019-08-05 DIAGNOSIS — I471 Supraventricular tachycardia: Secondary | ICD-10-CM

## 2019-08-05 DIAGNOSIS — F4323 Adjustment disorder with mixed anxiety and depressed mood: Secondary | ICD-10-CM

## 2019-08-05 DIAGNOSIS — I951 Orthostatic hypotension: Secondary | ICD-10-CM | POA: Diagnosis not present

## 2019-08-05 DIAGNOSIS — F331 Major depressive disorder, recurrent, moderate: Secondary | ICD-10-CM

## 2019-08-05 DIAGNOSIS — I10 Essential (primary) hypertension: Secondary | ICD-10-CM

## 2019-08-05 DIAGNOSIS — G20 Parkinson's disease: Secondary | ICD-10-CM

## 2019-08-05 NOTE — Progress Notes (Signed)
Office Visit    Patient Name: [name] Date of Encounter: 08/05/2019  Primary Care Provider:  Tonia Ghent, MD Primary Cardiologist:  Ida Rogue, MD Electrophysiologist:  None   Chief Complaint    [name] is a 69 y.o. male with a hx of HTN, bradycardia, Parkinson's s/p deep brain stimulator 11/2013 for trmor, HLD, depression, possible seizure disorder presents today for 6 month follow up of bradycardia and HTN.   Past Medical History    Past Medical History:  Diagnosis Date  . Arthritis   . Bradycardia   . Cancer [facility]) 2013   skin cancer  . Depression    ptsd  . Dysrhythmia    chronic slow heart rate  . GERD (gastroesophageal reflux disease)   . Headache(784.0)    tension headaches non recent  . History of chicken pox   . History of kidney stones    passed  . Hypertension    treated with HCTZ  . Parkinson's disease ([place])    dx'ed 15 years ago  . PTSD (post-traumatic stress disorder)   . Shortness of breath dyspnea   . Sleep apnea    doesn't use C-pap  . Varicose veins    Past Surgical History:  Procedure Laterality Date  . CHOLECYSTECTOMY N/A 10/22/2014   Procedure: LAPAROSCOPIC CHOLECYSTECTOMY WITH INTRAOPERATIVE CHOLANGIOGRAM;  Surgeon: Dia Crawford III, MD;  Location: [facility] ORS;  Service: General;  Laterality: N/A;  . cyst removed      from lip as a child  . INTRAMEDULLARY (IM) NAIL INTERTROCHANTERIC N/A 02/18/2019   Procedure: INTRAMEDULLARY (IM) NAIL INTERTROCHANTRIC, RIGHT,;  Surgeon: Thornton Park, MD;  Location: [facility] ORS;  Service: Orthopedics;  Laterality: N/A;  . LUMBAR LAMINECTOMY/DECOMPRESSION MICRODISCECTOMY Bilateral 12/14/2012   Procedure: Bilateral lumbar three-four, four-five decompressive laminotomy/foraminotomy;  Surgeon: Charlie Pitter, MD;  Location: [place] NEURO ORS;  Service: Neurosurgery;  Laterality: Bilateral;  . PULSE GENERATOR IMPLANT Bilateral 12/13/2013   Procedure: Bilateral implantable pulse generator placement;  Surgeon:  Erline Levine, MD;  Location: [place] NEURO ORS;  Service: Neurosurgery;  Laterality: Bilateral;  Bilateral implantable pulse generator placement  . skin cancer removed     from ears,   12 lft arm  rt leg 15  . SUBTHALAMIC STIMULATOR BATTERY REPLACEMENT Bilateral 07/14/2017   Procedure: BILATERAL IMPLANTED PULSE GENERATOR CHANGE FOR DEEP BRAIN STIMULATOR;  Surgeon: Erline Levine, MD;  Location: [place];  Service: Neurosurgery;  Laterality: Bilateral;  . SUBTHALAMIC STIMULATOR INSERTION Bilateral 12/06/2013   Procedure: SUBTHALAMIC STIMULATOR INSERTION;  Surgeon: Erline Levine, MD;  Location: [place] NEURO ORS;  Service: Neurosurgery;  Laterality: Bilateral;  Bilateral deep brain stimulator placement    Allergies  Allergies  Allergen Reactions  . Klonopin [Clonazepam] Other (See Comments)    Worsening mood/depression  . Morphine And Related Other (See Comments)    hallucinations  . Prednisone Other (See Comments)    Unclear reaction    History of Present Illness    [name] is a 69 y.o. male with a hx of HTN, bradycardia, atrial tachycardia on monitor 05/2016, Parkinson's s/p deep brain stimulator 11/2013 for tremor, HLD, depression, mild cognitive impairment, possible seizure disorder.   Previously wore cardiac monitor 2018 with primary sinus rhythm and 11 runs of atrial tachycardia which were asymptomatic.   He was last seen 01/15/19 by Dr. Rockey Situ. At that time his Losartan was discontinued for low blood pressure. His pravastatin was held to assess if gait instability would improve.   He had a  fall around Halloween and was admitted for ORIF 02/18/20 and subsequently discharged to SNF. He had COVID around Christmas.   Present today with his wife.  Blood pressure elevated today likely due to pain.  Checks very intermittently at home.  Reports history of low blood pressure.  Known history of orthostasis.  Feels he has been slow to recover from his hip fracture.  Starts with physical therapy this week  will be participating 3 times per week and physical therapy with [facility]. Reports falls are happening less frequently than previously due to balance issues - was once per week now once per month.   Reports some stable dyspnea on exertion.  Reports no shortness of breath at rest.  Reports no chest pain, pressure, tightness.  Reports no lightheadedness, dizziness, near-syncope or syncope.  He was educated along with his wife to report the symptoms.  We reviewed precautions for orthostatic hypotension.  Encouraged him to drink fluids regularly.  Encouraged to make slow position changes.  He uses a Rollator routinely for ambulation.  Wife reports he is falling about once per month.    EKGs/Labs/Other Studies Reviewed:   The following studies were reviewed today:  Long term monitor 05/2016 Event monitor Rhythm is normal sinus 11  runs of atrial tachycardia), the longest lasting 13.3 secs  Symptoms/triggered events were not associated with arrhythmia  EKG:  EKG is ordered today.  The ekg ordered today demonstrates SB 43 bpm with minimal voltage criteria for LVH and no acute ST/T wave changes.   Recent Labs: 10/04/2018: ALT 6 02/22/2019: Magnesium 2.2 02/24/2019: Hemoglobin 11.4; Platelets 179 02/25/2019: BUN 20; Creatinine, Ser 0.69; Potassium 3.8; Sodium 138  Recent Lipid Panel    Component Value Date/Time   CHOL 140 10/04/2018 1039   CHOL 185 06/03/2013 1051   TRIG 152.0 (H) 10/04/2018 1039   HDL 33.60 (L) 10/04/2018 1039   HDL 35 (L) 06/03/2013 1051   CHOLHDL 4 10/04/2018 1039   VLDL 30.4 10/04/2018 1039   LDLCALC 76 10/04/2018 1039   LDLCALC 127 (H) 06/03/2013 1051    Home Medications   Current Meds  Medication Sig  . carbidopa-levodopa (SINEMET IR) 25-100 MG tablet 2 tabs in the AM, 1 midday, 1 in the evening (Patient taking differently: 2 tabs in the AM, 1.5 midday, 1.5 in the evening)  . cholecalciferol (VITAMIN D) 1000 UNITS tablet Take 1,000 Units by mouth daily.  Marland Kitchen  escitalopram (LEXAPRO) 10 MG tablet TAKE 1 TABLET DAILY  . Melatonin 10 MG TABS Take 10 mg by mouth at bedtime.  . memantine (NAMENDA) 10 MG tablet TAKE 1 TABLET TWICE A DAY  . naproxen sodium (ALEVE) 220 MG tablet Take 440 mg by mouth 2 (two) times daily as needed (for pain or headache).  Marland Kitchen omeprazole (PRILOSEC) 20 MG capsule TAKE 1 CAPSULE DAILY  . traMADol (ULTRAM) 50 MG tablet Take by mouth every 6 (six) hours as needed.      Review of Systems   Review of Systems  Constitution: Negative for chills, fever and malaise/fatigue.  Cardiovascular: Positive for dyspnea on exertion. Negative for chest pain, leg swelling, near-syncope, orthopnea, palpitations and syncope.  Respiratory: Negative for cough, shortness of breath and wheezing.   Musculoskeletal: Positive for falls.  Gastrointestinal: Negative for nausea and vomiting.  Neurological: Negative for dizziness, light-headedness and weakness.   All other systems reviewed and are otherwise negative except as noted above.  Physical Exam    VS:  BP (!) 170/90 (BP Location: Left Arm, Patient  Position: Sitting, Cuff Size: Normal)   Pulse (!) 43   Ht 5\' 9"  (1.753 m)   Wt 187 lb 2 oz (84.9 kg)   SpO2 98%   BMI 27.63 kg/m  , BMI Body mass index is 27.63 kg/m. GEN: Well nourished, well developed, in no acute distress. HEENT: normal. Neck: Supple, no JVD, carotid bruits, or masses. Cardiac: bradycardic, no murmurs, rubs, or gallops. No clubbing, cyanosis, edema.  Radials 2+ and equal bilaterally.  Respiratory:  Respirations regular and unlabored, clear to auscultation bilaterally. GI: Soft, nontender, nondistended, BS + x 4. MS: No deformity or atrophy. Skin: Warm and dry, no rash. Neuro:  Strength and sensation are intact. Psych: Normal affect.  Accessory Clinical Findings    ECG personally reviewed by me today - SB 43 bpm with minimal voltage criteria for LVH and no acute ST/T wave changes. - no acute changes.  Assessment & Plan     1. HTN/Orthostatic hypotension - BP elevated today, but reports significant hip pain. BP from recent office visits 120-130s/70-80s. History of orthostatic hypotension and falls. As such, defer addition of antihypertensive agent at this time. Will request PT monitor orthostatic vitals as he will be participating 3 times per week.   2. HLD - Pravastatin previously discontinued due to concern it was contributory to LE weakness and gait instability. Since that time has had fall with hip fracture and ORIF. Will defer re-initiation of Pravastatin at this time as the risks outweigh the benefits. Consider re-trial of Pravastatin after he completes his upcoming PT.   3. Bradycardia - Stable finding on exam. EKG today SB 43 bpm. Not on any AV nodal blocking agents.  Reports no lightheadedness, dizziness, near-syncope, syncope. Anticipate his Parkinson's and autonomic dysfunction is contributory. Discussed with Dr. Rockey Situ in the office - as he is asymptomatic will defer ZIO monitor. If he develops lightheadedness, dizziness, near syncope, syncope he will inform our office - low threshold to send ZIO to his home. No indication for PPM at this time as he is asymptomatic.    4. Atrial tachycardia - Reports no recurrent symptoms. Avoid AV nodal blocking agents in the setting of bradycardia.   5. Parkinson's - Follows with neurology. Known gait instability associated with frequent falls. Careful monitoring of cardiac medications to prevent worsening falls.   Disposition: Follow up in 6 month(s) with Dr. Rockey Situ or APP.   Loel Dubonnet, NP 08/05/2019, 10:37 AM

## 2019-08-05 NOTE — Patient Instructions (Addendum)
Medication Instructions:  Your physician recommends that you continue on your current medications as directed. Please refer to the Current Medication list given to you today.  *If you need a refill on your cardiac medications before your next appointment, please call your pharmacy*   Lab Work: none ordered   If you have labs (blood work) drawn today and your tests are completely normal, you will receive your results only by: Marland Kitchen MyChart Message (if you have MyChart) OR . A paper copy in the mail If you have any lab test that is abnormal or we need to change your treatment, we will call you to review the results.   Testing/Procedures: None ordered    Follow-Up: At [facility], you and your health needs are our priority.  As part of our continuing mission to provide you with exceptional heart care, we have created designated Provider Care Teams.  These Care Teams include your primary Cardiologist (physician) and Advanced Practice Providers (APPs -  Physician Assistants and Nurse Practitioners) who all work together to provide you with the care you need, when you need it.  We recommend signing up for the patient portal called "MyChart".  Sign up information is provided on this After Visit Summary.  MyChart is used to connect with patients for Virtual Visits (Telemedicine).  Patients are able to view lab/test results, encounter notes, upcoming appointments, etc.  Non-urgent messages can be sent to your provider as well.   To learn more about what you can do with MyChart, go to [email or web address].    Your next appointment:    6 months  - Please call the office if you have any worsening SOB, dizziness or any other sx that are concerning to you. - Physical therapy will need to complete orthostatic blood pressures at your visit.

## 2019-08-06 ENCOUNTER — Ambulatory Visit
Admission: RE | Admit: 2019-08-06 | Discharge: 2019-08-06 | Disposition: A | Discharge status: Home / Self Care | Payer: Medicare PPO | Source: Ambulatory Visit | Attending: Orthopedic Surgery | Admitting: Orthopedic Surgery

## 2019-08-06 ENCOUNTER — Other Ambulatory Visit: Payer: Self-pay | Admitting: Orthopedic Surgery

## 2019-08-06 ENCOUNTER — Ambulatory Visit: Payer: Medicare PPO

## 2019-08-06 ENCOUNTER — Other Ambulatory Visit: Payer: Self-pay

## 2019-08-06 DIAGNOSIS — R2241 Localized swelling, mass and lump, right lower limb: Secondary | ICD-10-CM

## 2019-08-06 DIAGNOSIS — M7989 Other specified soft tissue disorders: Secondary | ICD-10-CM | POA: Diagnosis not present

## 2019-08-09 DIAGNOSIS — S72141D Displaced intertrochanteric fracture of right femur, subsequent encounter for closed fracture with routine healing: Secondary | ICD-10-CM | POA: Diagnosis not present

## 2019-08-09 DIAGNOSIS — M25552 Pain in left hip: Secondary | ICD-10-CM | POA: Diagnosis not present

## 2019-08-12 DIAGNOSIS — S72141D Displaced intertrochanteric fracture of right femur, subsequent encounter for closed fracture with routine healing: Secondary | ICD-10-CM | POA: Diagnosis not present

## 2019-08-14 DIAGNOSIS — S72141D Displaced intertrochanteric fracture of right femur, subsequent encounter for closed fracture with routine healing: Secondary | ICD-10-CM | POA: Diagnosis not present

## 2019-08-16 DIAGNOSIS — M5416 Radiculopathy, lumbar region: Secondary | ICD-10-CM | POA: Diagnosis not present

## 2019-08-19 ENCOUNTER — Ambulatory Visit (INDEPENDENT_AMBULATORY_CARE_PROVIDER_SITE_OTHER): Payer: Medicare PPO | Admitting: Psychology

## 2019-08-19 ENCOUNTER — Encounter: Payer: Self-pay | Admitting: Family Medicine

## 2019-08-19 DIAGNOSIS — F4323 Adjustment disorder with mixed anxiety and depressed mood: Secondary | ICD-10-CM | POA: Diagnosis not present

## 2019-08-19 DIAGNOSIS — F331 Major depressive disorder, recurrent, moderate: Secondary | ICD-10-CM | POA: Diagnosis not present

## 2019-08-21 ENCOUNTER — Other Ambulatory Visit: Payer: Self-pay | Admitting: Orthopedic Surgery

## 2019-08-21 ENCOUNTER — Other Ambulatory Visit: Payer: Self-pay

## 2019-08-21 ENCOUNTER — Ambulatory Visit: Payer: Medicare PPO | Attending: Neurology

## 2019-08-21 ENCOUNTER — Other Ambulatory Visit (HOSPITAL_COMMUNITY): Payer: Self-pay | Admitting: Orthopedic Surgery

## 2019-08-21 DIAGNOSIS — M25551 Pain in right hip: Secondary | ICD-10-CM | POA: Diagnosis not present

## 2019-08-21 DIAGNOSIS — M6281 Muscle weakness (generalized): Secondary | ICD-10-CM | POA: Diagnosis not present

## 2019-08-21 DIAGNOSIS — R2689 Other abnormalities of gait and mobility: Secondary | ICD-10-CM | POA: Insufficient documentation

## 2019-08-21 DIAGNOSIS — R2681 Unsteadiness on feet: Secondary | ICD-10-CM | POA: Diagnosis not present

## 2019-08-21 DIAGNOSIS — G20 Parkinson's disease: Secondary | ICD-10-CM | POA: Diagnosis not present

## 2019-08-21 DIAGNOSIS — M5416 Radiculopathy, lumbar region: Secondary | ICD-10-CM

## 2019-08-21 NOTE — Patient Instructions (Signed)
Access Code: [email or web address] URL: [email or web address] Date: 08/21/2019 Prepared by: Janna Arch  Exercises Sidelying Hip Flexor Stretch with Caregiver - 1 x daily - 7 x weekly - 2 sets - 2 reps - 30 hold Prone on Elbows Stretch - 1 x daily - 7 x weekly - 2 sets - 10 reps - 10 hold Supine Bridge - 1 x daily - 7 x weekly - 10 reps - 2 sets - 5 hold Seated Long Arc Quad - 1 x daily - 7 x weekly - 10 reps - 2 sets - 5 hold Seated Hip Adduction Isometrics with Ball - 1 x daily - 7 x weekly - 10 reps - 2 sets - 5 hold Seated Anterior Weight Shifting - 1 x daily - 7 x weekly - 10 reps - 2 sets - 5 hold Seated Gluteal Sets - 1 x daily - 7 x weekly - 10 reps - 2 sets - 5 hold

## 2019-08-21 NOTE — Therapy (Signed)
[place] MAIN [facility] SERVICES [address] [place], [place], [address] Phone: [phone]   Fax:  [phone]  Physical Therapy Evaluation  Patient Details  Name: [name] MRN: [id number] Date of Birth: 1951-03-18 Referring Provider (PT): Sharolyn Douglas MD    Encounter Date: 08/21/2019    Past Medical History:  Diagnosis Date  . Arthritis   . Bradycardia   . Cancer [facility]) 2013   skin cancer  . Depression    ptsd  . Dysrhythmia    chronic slow heart rate  . GERD (gastroesophageal reflux disease)   . Headache(784.0)    tension headaches non recent  . History of chicken pox   . History of kidney stones    passed  . Hypertension    treated with HCTZ  . Parkinson's disease ([place])    dx'ed 15 years ago  . PTSD (post-traumatic stress disorder)   . Shortness of breath dyspnea   . Sleep apnea    doesn't use C-pap  . Varicose veins     Past Surgical History:  Procedure Laterality Date  . CHOLECYSTECTOMY N/A 10/22/2014   Procedure: LAPAROSCOPIC CHOLECYSTECTOMY WITH INTRAOPERATIVE CHOLANGIOGRAM;  Surgeon: Dia Crawford III, MD;  Location: [facility] ORS;  Service: General;  Laterality: N/A;  . cyst removed      from lip as a child  . INTRAMEDULLARY (IM) NAIL INTERTROCHANTERIC N/A 02/18/2019   Procedure: INTRAMEDULLARY (IM) NAIL INTERTROCHANTRIC, RIGHT,;  Surgeon: Thornton Park, MD;  Location: [facility] ORS;  Service: Orthopedics;  Laterality: N/A;  . LUMBAR LAMINECTOMY/DECOMPRESSION MICRODISCECTOMY Bilateral 12/14/2012   Procedure: Bilateral lumbar three-four, four-five decompressive laminotomy/foraminotomy;  Surgeon: Charlie Pitter, MD;  Location: [place] NEURO ORS;  Service: Neurosurgery;  Laterality: Bilateral;  . PULSE GENERATOR IMPLANT Bilateral 12/13/2013   Procedure: Bilateral implantable pulse generator placement;  Surgeon: Erline Levine, MD;  Location: [place] NEURO ORS;  Service: Neurosurgery;  Laterality: Bilateral;  Bilateral implantable pulse  generator placement  . skin cancer removed     from ears,   12 lft arm  rt leg 15  . SUBTHALAMIC STIMULATOR BATTERY REPLACEMENT Bilateral 07/14/2017   Procedure: BILATERAL IMPLANTED PULSE GENERATOR CHANGE FOR DEEP BRAIN STIMULATOR;  Surgeon: Erline Levine, MD;  Location: [place];  Service: Neurosurgery;  Laterality: Bilateral;  . SUBTHALAMIC STIMULATOR INSERTION Bilateral 12/06/2013   Procedure: SUBTHALAMIC STIMULATOR INSERTION;  Surgeon: Erline Levine, MD;  Location: [place] NEURO ORS;  Service: Neurosurgery;  Laterality: Bilateral;  Bilateral deep brain stimulator placement    There were no vitals filed for this visit.   Subjective Assessment - 08/21/19 1024    Subjective  Patient is a pleasant 69 year old male who is returning to this clinic for parkinson's disease.    Pertinent History  [name] is a 69 y.o. male with a hx of HTN, bradycardia, Parkinson's s/p deep brain stimulator 11/2013 , HLD, depression, possible seizure disorder, arthritis, depression/PTSD, dysrhythmia, GERD, sleep apnea. Patient was seen by this therapist in years past. Patient had a fall around Halloween of 2020 and admitted for ORIF on 02/18/19, was discharged to SNF and had [facility] around Christmas. Had home health therapy for a month and a half. After that tried to go to Outpatient Therapy for 3 sessions at [facility] and was not pleased due to how busy and crowded the location was. Fell last week in bathroom, didn't take walker in, falls about 2x month. Is very weak in RLE. Patient has a new lift chair and U drive walking.  Has a caregiver 4 days a week.    Limitations  Walking;Standing;Sitting;House hold activities;Lifting    How long can you sit comfortably?  not limited with a backrest, without a back rest 3 minutes    How long can you stand comfortably?  3-5 minutes with holding on.    How long can you walk comfortably?  with U step 6 minutes.    Patient Stated Goals  pain reduction of R hip. strength of legs, dance  with wife. push lawnmower.    Currently in Pain?  Yes    Pain Score  3     Pain Location  Hip    Pain Orientation  Right    Pain Descriptors / Indicators  Aching;Jabbing    Pain Type  Chronic pain    Pain Onset  More than a month ago    Pain Frequency  Intermittent    Aggravating Factors   changing positions, first weightbearing    Pain Relieving Factors  rest    Effect of Pain on Daily Activities  limits walking and transfers    Multiple Pain Sites  Yes    Pain Score  3    Pain Location  Knee    Pain Orientation  Right    Pain Descriptors / Indicators  Aching;Stabbing    Pain Type  Acute pain    Pain Onset  More than a month ago    Pain Frequency  Intermittent    Aggravating Factors   weightbearing         [facility] PT Assessment - 08/21/19 0001      Assessment   Medical Diagnosis  Parkinson's Disease    Referring Provider (PT)  Sharolyn Douglas MD     Onset Date/Surgical Date  --   2002 Parkinsons, 2020 R hip    Hand Dominance  Right    Prior Therapy  yes with this therapist      Precautions   Precautions  Fall;Other (comment)   brain stimulator      Restrictions   Weight Bearing Restrictions  No      Balance Screen   Has the patient fallen in the past 6 months  Yes    How many times?  10    Has the patient had a decrease in activity level because of a fear of falling?   Yes    Is the patient reluctant to leave their home because of a fear of falling?   Yes      [place] residence    Living Arrangements  Spouse/significant other    Available Help at Discharge  Family    Type of [place]  One level    [place] - 4 wheels;Cane - single point;Grab bars - toilet;Grab bars - tub/shower;Wheelchair - Education administrator (comment);Shower seat - built in      Prior Function   Level of Independence  --   some days can dress self others day can't    Vocation  Retired    Leisure   Previously Editor, commissioning   Overall Cognitive Status  History of cognitive impairments - at baseline      Standardized Balance Assessment   Standardized Balance Assessment  Chief Technology Officer Test   Sit to Stand  Able to stand  independently using hands    Standing Unsupported  Able to stand 30 seconds unsupported    Sitting with Back Unsupported but Feet Supported on Floor or Stool  Able to sit 2 minutes under supervision    Stand to Sit  Controls descent by using hands    Transfers  Able to transfer with verbal cueing and /or supervision    Standing Unsupported with Eyes Closed  Able to stand 3 seconds    Standing Unsupported with Feet Together  Able to place feet together independently but unable to hold for 30 seconds    From Standing, Reach Forward with Outstretched Arm  Reaches forward but needs supervision    From Standing Position, Pick up Object from Floor  Unable to pick up and needs supervision    From Standing Position, Turn to Look Behind Over each Shoulder  Looks behind from both sides and weight shifts well    Turn 360 Degrees  Able to turn 360 degrees safely but slowly    Standing Unsupported, Alternately Place Feet on Step/Stool  Needs assistance to keep from falling or unable to try    Standing Unsupported, One Foot in Front  Needs help to step but can hold 15 seconds    Standing on One Leg  Unable to try or needs assist to prevent fall    Total Score  26           Orthostatics: Seated:  151/71 pulse 42  Standing 141/79 pulse 98    PAIN:  R hip: Worst pain: 7/10 : first standing is the worst Least amount of pain: 2/10 Current Pain: 3/10  R knee: Worst pain: 6/10 Least amount of pain: 3/10 Current pain: 3/10  Back lower:  Worst pain: 5/10 Least amount of pain: 3/10 Current pain: 3/10  Shoulders (bilateral) Worst pain: 4/10 Least amount of pain: 0/10 Current pain: 3/10   POSTURE: Seated: Forward  head rounded shoulders ; weight shift onto L hip Standing: R hip unable to obtain neutral position for upright posture, frequent posterior trunk sway  PROM/AROM: R hip: Flexion: WFL Extension -3 degrees IR/ER: limited by pain unable to fully assess due to guarding Abduction: WFL Adduction: limited by pain  STRENGTH:  Graded on a 0-5 scale Muscle Group Left Right  Hip Flex 4-/5 3/5  Hip Abd 4-/5 3/5  Hip Add 2+/5 2/5  Hip Ext 2+/5 2/5  Hip IR/ER    Knee Flex 4-/5 3+/5  Knee Ext 4-/5 3+/5  Ankle DF 4-/5 3+/5  Ankle PF 4-/5 3+/5   SENSATION: Patient reports no changes in sensation to light touch   SPECIAL TESTS:  Coordination: dysmetria with cogwheeling heel shin test : limited on R due to R hip  FUNCTIONAL MOBILITY: STS: primarily weight shifts to RLE prior to standing  Sit to supine: mod I, challenged with lifting RLE onto table, Sidelying: supine to sidelying mod I, unable to tolerate to R, unable to roll prone   BALANCE: Dynamic Sitting Balance  Normal Able to sit unsupported and weight shift across midline maximally   Good Able to sit unsupported and weight shift across midline moderately   Good-/Fair+ Able to sit unsupported and weight shift across midline minimally   Fair Minimal weight shifting ipsilateral/front, difficulty crossing midline   Fair- Reach to ipsilateral side and unable to weight shift   Poor + Able to sit unsupported with min A and reach to ipsilateral side, unable to weight shift x  Poor Able to sit  unsupported with mod A and reach ipsilateral/front-can't cross midline     Standing Dynamic Balance  Normal Stand independently unsupported, able to weight shift and cross midline maximally   Good Stand independently unsupported, able to weight shift and cross midline moderately   Good-/Fair+ Stand independently unsupported, able to weight shift across midline minimally   Fair Stand independently unsupported, weight shift, and reach ipsilaterally,  loss of balance when crossing midline   Poor+ Able to stand with Min A and reach ipsilaterally, unable to weight shift x  Poor Able to stand with Mod A and minimally reach ipsilaterally, unable to cross midline.     Static Sitting Balance  Normal Able to maintain balance against maximal resistance   Good Able to maintain balance against moderate resistance   Good-/Fair+ Accepts minimal resistance   Fair Able to sit unsupported without balance loss and without UE support x  Poor+ Able to maintain with Minimal assistance from individual or chair   Poor Unable to maintain balance-requires mod/max support from individual or chair     Static Standing Balance  Normal Able to maintain standing balance against maximal resistance   Good Able to maintain standing balance against moderate resistance   Good-/Fair+ Able to maintain standing balance against minimal resistance   Fair Able to stand unsupported without UE support and without LOB for 1-2 min x  Fair- Requires Min A and UE support to maintain standing without loss of balance   Poor+ Requires mod A and UE support to maintain standing without loss of balance   Poor Requires max A and UE support to maintain standing balance without loss       GAIT: Patient ambulates with U step walker, has decreased hip extension (unable to step past neutral without hip hinge/trunk compensatory mechanism) , two posterior LOB requiring Min-mod A to maintain upright posture.   OUTCOME MEASURES: TEST Outcome Interpretation  5 times sit<>stand 19 seconds one posterior LOB  >60 yo, >15 sec indicates increased risk for falls  10 meter walk test      14 seconds with U step        =0.71    m/s <1.0 m/s indicates increased risk for falls; limited community ambulator          [employer] Assessment 26/56 <36/56 (100% risk for falls), 37-45 (80% risk for falls); 46-51 (>50% risk for falls); 52-55 (lower risk <25% of falls)  FOTO 12/100 Discharge FOTO score  recommended 31/100       Objective measurements completed on examination: See above findings.       Access Code: [email or web address] URL: [email or web address] Date: 08/21/2019 Prepared by: Janna Arch  Exercises Sidelying Hip Flexor Stretch with Caregiver - 1 x daily - 7 x weekly - 2 sets - 2 reps - 30 hold Prone on Elbows Stretch - 1 x daily - 7 x weekly - 2 sets - 10 reps - 10 hold Supine Bridge - 1 x daily - 7 x weekly - 10 reps - 2 sets - 5 hold Seated Long Arc Quad - 1 x daily - 7 x weekly - 10 reps - 2 sets - 5 hold Seated Hip Adduction Isometrics with Ball - 1 x daily - 7 x weekly - 10 reps - 2 sets - 5 hold Seated Anterior Weight Shifting - 1 x daily - 7 x weekly - 10 reps - 2 sets - 5 hold Seated Gluteal Sets - 1 x daily - 7 x weekly - 10 reps -  2 sets - 5 hold         PT Education - 08/21/19 1504    Education provided  Yes    Education Details  goals, POC, HEP    Person(s) Educated  Patient;Spouse    Methods  Explanation;Demonstration;Tactile cues;Verbal cues;Handout    Comprehension  Verbalized understanding;Returned demonstration;Verbal cues required;Tactile cues required       PT Short Term Goals - 01/09/19 1030      PT SHORT TERM GOAL #1   Title  Pt will perform HEP with family's supervision, for improved balance, transfers, and gait.      Baseline  7/7 HEP given; 8/5 pt reported that his is not performing his HEP consistently, still has his paper copy 8/31 compliant    Time  2    Period  Weeks    Status  Achieved    Target Date  12/05/18        PT Long Term Goals - 02/13/19 1757      PT LONG TERM GOAL #1   Title  Patient (> 70 years old) will complete five times sit to stand test in < 15 seconds without UE support indicating an increased LE strength and improved balance.    Baseline  7/7: 20 seconds with UE support and one posterior LOB 8/12: 15 seconds one hand on knee; 01/09/19: 17.07 seconds without UE support, with 1 uncontrolled descent  10/28: 14 seconds no UE support    Time  8    Period  Weeks    Status  Achieved      PT LONG TERM GOAL #2   Title  Patient will increase 10 meter walk test to >1.38ms as to improve gait speed for better community ambulation and to reduce fall risk.    Baseline  7/7: .58 m/s with U step walker 8/12: 1.0 m/s    Time  8    Period  Weeks    Status  Achieved      PT LONG TERM GOAL #3   Title  Patient will increase Berg Balance score by > 6 points (42/56) to demonstrate decreased fall risk during functional activities.    Baseline  7/7: 25/56 8/12: 36/56; 01/12/19: 43/56 10/28: 48/56    Time  8    Period  Weeks    Status  Achieved      PT LONG TERM GOAL #4   Title  Patient will deny any falls over past 4 weeks to demonstrate improved safety awareness at home and in the community.    Baseline  7/7: averages 6 falls/month 8/12: 4 falls in past month; 01/09/19: 4-5 falls in past month 10/28: 2-3 falls    Time  8    Period  Weeks    Status  Partially Met    Target Date  04/10/19      PT LONG TERM GOAL #5   Title  Patient will increase six minute walk test distance to >1000 for progression to community ambulator and improve gait ability    Baseline  12/17/18 : 551 ft with U step walker; 01/09/19: 800 ft with U step walker 10/28: 825 ft with u step walker    Time  8    Period  Weeks    Status  Partially Met    Target Date  04/10/19      PT LONG TERM GOAL #6   Title  Pt will improve BERG to 54/56  in order to demonstrate clinically significant improvement in  balance for decreased fall risk    Baseline  10/28: 48/56    Time  8    Period  Weeks    Status  New    Target Date  04/10/19               Patient will benefit from skilled therapeutic intervention in order to improve the following deficits and impairments:     Visit Diagnosis: No diagnosis found.     Problem List Patient Active Problem List   Diagnosis Date Noted  . E. coli bacteremia 02/23/2019  . Dysphagia  02/23/2019  . Aspiration pneumonia ([place]) 02/21/2019  . Closed right hip fracture ([place]) 02/16/2019  . Rib pain on right side 09/25/2018  . Skin sore 12/01/2017  . Hematoma 10/18/2017  . Fall at home 10/18/2017  . 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 Radicular pain in right arm 10/09/2016  . GERD (gastroesophageal reflux disease) 07/31/2016  . Trochanteric bursitis 03/03/2016  . Left hand pain 10/30/2015  . Fracture, finger, multiple sites 10/30/2015  . Colon cancer screening 07/23/2015  . Healthcare maintenance 07/23/2015  . Gout 02/18/2015  . Depression 01/06/2015  . Irritation of eyelid 08/20/2014  . Dupuytren's contracture 08/20/2014  . Cough 07/21/2014  . Lumbar stenosis with neurogenic claudication 06/13/2014  . Preop cardiovascular exam 05/28/2014  . S/P deep brain stimulator placement 05/08/2014  . Joint pain 01/22/2014  . Medicare annual wellness visit, initial 01/19/2014  . Advance care planning 01/19/2014  . Parkinson's disease ([place]) 12/13/2013  . PTSD (post-traumatic stress disorder) 06/13/2013  . Erectile dysfunction 06/13/2013  . HLD (hyperlipidemia) 06/13/2013  . Hip pain 06/10/2013  . Pain in joint, shoulder region 06/10/2013  . Right leg swelling 06/03/2013  . Essential hypertension 06/03/2013  . Bradycardia by electrocardiogram 06/03/2013  . Obstructive sleep apnea 03/12/2013  . Spinal stenosis, lumbar region, with neurogenic claudication 12/14/2012   Janna Arch, PT, DPT   08/21/2019, 3:09 PM  [place] MAIN [facility] SERVICES [address] [place], [place], [address] Phone: [phone]   Fax:  [phone]  Name: [name] MRN: [id number] Date of Birth: 01-Apr-1951

## 2019-08-22 ENCOUNTER — Telehealth: Payer: Self-pay

## 2019-08-22 ENCOUNTER — Other Ambulatory Visit: Payer: Self-pay | Admitting: Family Medicine

## 2019-08-22 DIAGNOSIS — S72001D Fracture of unspecified part of neck of right femur, subsequent encounter for closed fracture with routine healing: Secondary | ICD-10-CM

## 2019-08-22 NOTE — Telephone Encounter (Signed)
Spoke w/pt's wife.  They request [facility] PT/OT referral be canceled.  Pt was able to be seen by [facility] PT yesterday.

## 2019-08-23 NOTE — Telephone Encounter (Signed)
Noted. Thanks.

## 2019-08-27 ENCOUNTER — Ambulatory Visit: Payer: Medicare PPO

## 2019-08-27 ENCOUNTER — Other Ambulatory Visit: Payer: Self-pay

## 2019-08-29 ENCOUNTER — Other Ambulatory Visit: Payer: Self-pay

## 2019-08-29 ENCOUNTER — Ambulatory Visit: Payer: Medicare PPO

## 2019-08-29 DIAGNOSIS — M25551 Pain in right hip: Secondary | ICD-10-CM | POA: Diagnosis not present

## 2019-08-29 DIAGNOSIS — R2689 Other abnormalities of gait and mobility: Secondary | ICD-10-CM

## 2019-08-29 DIAGNOSIS — R2681 Unsteadiness on feet: Secondary | ICD-10-CM | POA: Diagnosis not present

## 2019-08-29 DIAGNOSIS — G20 Parkinson's disease: Secondary | ICD-10-CM | POA: Diagnosis not present

## 2019-08-29 DIAGNOSIS — M6281 Muscle weakness (generalized): Secondary | ICD-10-CM | POA: Diagnosis not present

## 2019-08-29 NOTE — Therapy (Signed)
[place] MAIN [facility] SERVICES [address] [place], [place], [address] Phone: [phone]   Fax:  [phone]  Physical Therapy Treatment  Patient Details  Name: [name] MRN: [id number] Date of Birth: 1950/07/04 Referring Provider (PT): Sharolyn Douglas MD    Encounter Date: 08/29/2019  PT End of Session - 08/29/19 0958    Visit Number  2    Number of Visits  16    Date for PT Re-Evaluation  10/16/19    Authorization Type  2/10; eval 5/5/ 21    PT Start Time  1011    PT Stop Time  1059    PT Time Calculation (min)  48 min    Equipment Utilized During Treatment  Gait belt    Activity Tolerance  Patient tolerated treatment well;Patient limited by pain    Behavior During Therapy  [facility] for tasks assessed/performed       Past Medical History:  Diagnosis Date  . Arthritis   . Bradycardia   . Cancer [facility]) 2013   skin cancer  . Depression    ptsd  . Dysrhythmia    chronic slow heart rate  . GERD (gastroesophageal reflux disease)   . Headache(784.0)    tension headaches non recent  . History of chicken pox   . History of kidney stones    passed  . Hypertension    treated with HCTZ  . Parkinson's disease ([place])    dx'ed 15 years ago  . PTSD (post-traumatic stress disorder)   . Shortness of breath dyspnea   . Sleep apnea    doesn't use C-pap  . Varicose veins     Past Surgical History:  Procedure Laterality Date  . CHOLECYSTECTOMY N/A 10/22/2014   Procedure: LAPAROSCOPIC CHOLECYSTECTOMY WITH INTRAOPERATIVE CHOLANGIOGRAM;  Surgeon: Dia Crawford III, MD;  Location: [facility] ORS;  Service: General;  Laterality: N/A;  . cyst removed      from lip as a child  . INTRAMEDULLARY (IM) NAIL INTERTROCHANTERIC N/A 02/18/2019   Procedure: INTRAMEDULLARY (IM) NAIL INTERTROCHANTRIC, RIGHT,;  Surgeon: Thornton Park, MD;  Location: [facility] ORS;  Service: Orthopedics;  Laterality: N/A;  . LUMBAR LAMINECTOMY/DECOMPRESSION MICRODISCECTOMY Bilateral  12/14/2012   Procedure: Bilateral lumbar three-four, four-five decompressive laminotomy/foraminotomy;  Surgeon: Charlie Pitter, MD;  Location: [place] NEURO ORS;  Service: Neurosurgery;  Laterality: Bilateral;  . PULSE GENERATOR IMPLANT Bilateral 12/13/2013   Procedure: Bilateral implantable pulse generator placement;  Surgeon: Erline Levine, MD;  Location: [place] NEURO ORS;  Service: Neurosurgery;  Laterality: Bilateral;  Bilateral implantable pulse generator placement  . skin cancer removed     from ears,   12 lft arm  rt leg 15  . SUBTHALAMIC STIMULATOR BATTERY REPLACEMENT Bilateral 07/14/2017   Procedure: BILATERAL IMPLANTED PULSE GENERATOR CHANGE FOR DEEP BRAIN STIMULATOR;  Surgeon: Erline Levine, MD;  Location: [place];  Service: Neurosurgery;  Laterality: Bilateral;  . SUBTHALAMIC STIMULATOR INSERTION Bilateral 12/06/2013   Procedure: SUBTHALAMIC STIMULATOR INSERTION;  Surgeon: Erline Levine, MD;  Location: [place] NEURO ORS;  Service: Neurosurgery;  Laterality: Bilateral;  Bilateral deep brain stimulator placement    There were no vitals filed for this visit.  Subjective Assessment - 08/29/19 1106    Subjective  Patient presents with wife. No falls since last session but has been having very bad low back pain, not as bad this morning since he took pain medicine.    Pertinent History  [name] is a 69 y.o. male with a hx of HTN, bradycardia,  Parkinson's s/p deep brain stimulator 11/2013 , HLD, depression, possible seizure disorder, arthritis, depression/PTSD, dysrhythmia, GERD, sleep apnea. Patient was seen by this therapist in years past. Patient had a fall around Halloween of 2020 and admitted for ORIF on 02/18/19, was discharged to SNF and had [place] around Christmas. Had home health therapy for a month and a half. After that tried to go to Outpatient Therapy for 3 sessions at [facility] and was not pleased due to how busy and crowded the location was. Fell last week in bathroom, didn't take walker in, falls  about 2x month. Is very weak in RLE. Patient has a new lift chair and U drive walking. Has a caregiver 4 days a week.    Limitations  Walking;Standing;Sitting;House hold activities;Lifting    How long can you sit comfortably?  not limited with a backrest, without a back rest 3 minutes    How long can you stand comfortably?  3-5 minutes with holding on.    How long can you walk comfortably?  with U step 6 minutes.    Patient Stated Goals  pain reduction of R hip. strength of legs, dance with wife. push lawnmower.    Currently in Pain?  Yes    Pain Score  3     Pain Location  Hip    Pain Orientation  Right    Pain Descriptors / Indicators  Aching;Stabbing    Pain Type  Chronic pain    Pain Onset  More than a month ago    Pain Frequency  Intermittent    Pain Score  3    Pain Location  Back    Pain Orientation  Lower    Pain Descriptors / Indicators  Aching    Pain Type  Chronic pain    Pain Onset  More than a month ago    Pain Frequency  Constant          Supine position: Hamstring stretch 30-60 second holds RLE  R piriformis stretch 30-60 second hold RLE R IT band stretch 30-60 second hold RLE LE rotation 60 seconds  Ambulate with walker , 40 ft x 2 trials, cues for upright posture and equal step length, close CGA  Standing:  Speed ladder -one foot in each box for increased step length and spatial awareness 16x length of // bars  Static standing posture for upright positioning throwing small balls at target x 3 minutes, progressed for increased challenge to be on top of airex pad x 3 minutes  airex pad: one foot on airex pad one foot on 6" step 30 second holds x 2 trials each LE ; SUE support. Min A to R pelvis   Standing hip extension 10x each LE, cues for upright posture  Seated: RTB hamstring curl 10x each LE RTB abduction RLE 10x  Large swiss ball forward roll for shoulder and back pain relief 10x      Pt educated throughout session about proper posture and  technique with exercises. Improved exercise technique, movement at target joints, use of target muscles after min to mod verbal, visual, tactile cues.   Patient presents with wife with excellent motivation to physical therapy session. Patient does demonstrates occasional knee buckling of R knee and pain with R hip with prolonged standing and mobility. Patient is challenged with gluteal activation of RLE results in limited stabilization in single limb. Patient would benefit from skilled physical therapy to increase strength, stability, balance, and decrease falls risk to improve quality of  life.                    PT Education - 08/29/19 [phone]    Education provided  Yes    Education Details  exercise technique, body mechanics    Person(s) Educated  Patient;Spouse    Methods  Explanation;Demonstration;Tactile cues;Verbal cues    Comprehension  Verbalized understanding;Returned demonstration;Verbal cues required;Tactile cues required       PT Short Term Goals - 08/22/19 1248      PT SHORT TERM GOAL #1   Title  Pt will perform HEP with family's/aide's supervision, for improved balance, transfers, and gait.    Baseline  5/5: HEP given    Time  4    Period  Weeks    Status  New    Target Date  09/19/19        PT Long Term Goals - 08/22/19 1249      PT LONG TERM GOAL #1   Title  Patient (> 26 years old) will complete five times sit to stand test in < 15 seconds without UE support without LOB indicating an increased LE strength and improved balance.    Baseline  5/5: 19 seconds one posterior LOB    Time  8    Period  Weeks    Status  New    Target Date  10/16/19      PT LONG TERM GOAL #2   Title  Patient will increase 10 meter walk test to >1.30m/s as to improve gait speed for better community ambulation and to reduce fall risk.    Baseline  4/4: 0.71 m/s with U step walker    Time  8    Period  Weeks    Status  New    Target Date  10/16/19      PT LONG TERM GOAL  #3   Title  Patient will increase Berg Balance score by > 6 points (32/56) to demonstrate decreased fall risk during functional activities.    Baseline  5/5: 26/56    Time  8    Period  Weeks    Status  New    Target Date  10/16/19      PT LONG TERM GOAL #4   Title  Patient will increase FOTO score to equal to or greater than 31/100  to demonstrate statistically significant improvement in mobility and quality of life.    Baseline  5/5: 12/100    Time  8    Period  Weeks    Status  New    Target Date  10/16/19            Plan - 08/29/19 1108    Clinical Impression Statement  Patient presents with wife with excellent motivation to physical therapy session. Patient does demonstrates occasional knee buckling of R knee and pain with R hip with prolonged standing and mobility. Patient is challenged with gluteal activation of RLE results in limited stabilization in single limb. Patient would benefit from skilled physical therapy to increase strength, stability, balance, and decrease falls risk to improve quality of life.    Personal Factors and Comorbidities  Age;Comorbidity 3+;Past/Current Experience;Time since onset of injury/illness/exacerbation;Transportation    Comorbidities  athritis, bradycardia, cancer, depression, dysrhythmia, GERD, HTN, Parkinson's, PTSD, SOB, sleep apnea    Examination-Activity Limitations  Bathing;Bed Mobility;Caring for Others;Bend;Dressing;Lift;Locomotion Level;Reach Overhead;Sit;Squat;Stairs;Stand;Toileting;Transfers    Examination-Participation Restrictions  Church;Cleaning;Community Activity;Interpersonal Relationship;Shop;Volunteer;Yard Work;Other    Stability/Clinical Decision Making  Unstable/Unpredictable    Rehab  Potential  Fair    PT Frequency  2x / week    PT Duration  8 weeks    PT Treatment/Interventions  ADLs/Self Care Home Management;Aquatic Therapy;Cryotherapy;Electrical Stimulation;Iontophoresis 4mg /ml Dexamethasone;Moist  Heat;Ultrasound;Contrast Bath;DME Instruction;Gait training;Stair training;Functional mobility training;Therapeutic activities;Therapeutic exercise;Balance training;Neuromuscular re-education;Patient/family education;Manual techniques;Wheelchair mobility training;Energy conservation;Passive range of motion;Traction;Orthotic Fit/Training;Dry needling;Vestibular;Visual/perceptual remediation/compensation;Taping;Canalith Repostioning    PT Next Visit Plan  HEP, balance RLE strength, hip extension    PT Home Exercise Plan  [email or web address]    Consulted and Agree with Plan of Care  Patient;Family member/caregiver    Family Member Consulted  wife       Patient will benefit from skilled therapeutic intervention in order to improve the following deficits and impairments:  Abnormal gait, Decreased balance, Decreased coordination, Decreased mobility, Impaired tone, Postural dysfunction, Decreased strength, Decreased safety awareness, Improper body mechanics, Impaired flexibility, Decreased activity tolerance, Decreased endurance, Decreased knowledge of precautions, Difficulty walking, Pain, Cardiopulmonary status limiting activity, Decreased range of motion, Impaired perceived functional ability  Visit Diagnosis: Other abnormalities of gait and mobility  Unsteadiness on feet  Pain in right hip  Parkinson's disease [facility])     Problem List Patient Active Problem List   Diagnosis Date Noted  . E. coli bacteremia 02/23/2019  . Dysphagia 02/23/2019  . Aspiration pneumonia ([place]) 02/21/2019  . Closed right hip fracture ([place]) 02/16/2019  . Rib pain on right side 09/25/2018  . Skin sore 12/01/2017  . Hematoma 10/18/2017  . Fall at home 10/18/2017  . 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 Radicular pain in right arm 10/09/2016  . GERD (gastroesophageal reflux disease) 07/31/2016  . Trochanteric bursitis 03/03/2016  . Left hand pain 10/30/2015  . Fracture, finger, multiple sites 10/30/2015  . Colon cancer  screening 07/23/2015  . Healthcare maintenance 07/23/2015  . Gout 02/18/2015  . Depression 01/06/2015  . Irritation of eyelid 08/20/2014  . Dupuytren's contracture 08/20/2014  . Cough 07/21/2014  . Lumbar stenosis with neurogenic claudication 06/13/2014  . Preop cardiovascular exam 05/28/2014  . S/P deep brain stimulator placement 05/08/2014  . Joint pain 01/22/2014  . Medicare annual wellness visit, initial 01/19/2014  . Advance care planning 01/19/2014  . Parkinson's disease ([place]) 12/13/2013  . PTSD (post-traumatic stress disorder) 06/13/2013  . Erectile dysfunction 06/13/2013  . HLD (hyperlipidemia) 06/13/2013  . Hip pain 06/10/2013  . Pain in joint, shoulder region 06/10/2013  . Right leg swelling 06/03/2013  . Essential hypertension 06/03/2013  . Bradycardia by electrocardiogram 06/03/2013  . Obstructive sleep apnea 03/12/2013  . Spinal stenosis, lumbar region, with neurogenic claudication 12/14/2012   Janna Arch, PT, DPT   08/29/2019, 11:10 AM  [place] MAIN [facility] SERVICES [address] [place], [place], [address] Phone: [phone]   Fax:  [phone]  Name: DEVONTA BOHREN MRN: [id number] Date of Birth: 05-23-1950

## 2019-09-02 ENCOUNTER — Ambulatory Visit (INDEPENDENT_AMBULATORY_CARE_PROVIDER_SITE_OTHER): Payer: Medicare PPO | Admitting: Psychology

## 2019-09-02 DIAGNOSIS — F4323 Adjustment disorder with mixed anxiety and depressed mood: Secondary | ICD-10-CM | POA: Diagnosis not present

## 2019-09-02 DIAGNOSIS — F331 Major depressive disorder, recurrent, moderate: Secondary | ICD-10-CM

## 2019-09-03 ENCOUNTER — Ambulatory Visit: Payer: Medicare PPO

## 2019-09-03 ENCOUNTER — Other Ambulatory Visit: Payer: Self-pay

## 2019-09-03 DIAGNOSIS — R2689 Other abnormalities of gait and mobility: Secondary | ICD-10-CM

## 2019-09-03 DIAGNOSIS — G20 Parkinson's disease: Secondary | ICD-10-CM

## 2019-09-03 DIAGNOSIS — R2681 Unsteadiness on feet: Secondary | ICD-10-CM | POA: Diagnosis not present

## 2019-09-03 DIAGNOSIS — M25551 Pain in right hip: Secondary | ICD-10-CM | POA: Diagnosis not present

## 2019-09-03 DIAGNOSIS — M6281 Muscle weakness (generalized): Secondary | ICD-10-CM | POA: Diagnosis not present

## 2019-09-03 NOTE — Therapy (Signed)
[place] MAIN [facility] SERVICES [address] [place], [place], [address] Phone: [phone]   Fax:  [phone]  Physical Therapy Treatment  Patient Details  Name: [name] MRN: [id number] Date of Birth: [birth year] Referring Provider (PT): Sharolyn Douglas MD    Encounter Date: 09/03/2019  PT End of Session - 09/03/19 0939    Visit Number  3    Number of Visits  16    Date for PT Re-Evaluation  10/16/19    Authorization Type  3/10; eval 5/5/ 21    PT Start Time  0932    PT Stop Time  1014    PT Time Calculation (min)  42 min    Equipment Utilized During Treatment  Gait belt    Activity Tolerance  Patient tolerated treatment well;Patient limited by pain    Behavior During Therapy  [facility] for tasks assessed/performed       Past Medical History:  Diagnosis Date  . Arthritis   . Bradycardia   . Cancer [facility]) 2013   skin cancer  . Depression    ptsd  . Dysrhythmia    chronic slow heart rate  . GERD (gastroesophageal reflux disease)   . Headache(784.0)    tension headaches non recent  . History of chicken pox   . History of kidney stones    passed  . Hypertension    treated with HCTZ  . Parkinson's disease ([place])    dx'ed 15 years ago  . PTSD (post-traumatic stress disorder)   . Shortness of breath dyspnea   . Sleep apnea    doesn't use C-pap  . Varicose veins     Past Surgical History:  Procedure Laterality Date  . CHOLECYSTECTOMY N/A 10/22/2014   Procedure: LAPAROSCOPIC CHOLECYSTECTOMY WITH INTRAOPERATIVE CHOLANGIOGRAM;  Surgeon: Dia Crawford III, MD;  Location: [facility] ORS;  Service: General;  Laterality: N/A;  . cyst removed      from lip as a child  . INTRAMEDULLARY (IM) NAIL INTERTROCHANTERIC N/A 02/18/2019   Procedure: INTRAMEDULLARY (IM) NAIL INTERTROCHANTRIC, RIGHT,;  Surgeon: Thornton Park, MD;  Location: [facility] ORS;  Service: Orthopedics;  Laterality: N/A;  . LUMBAR LAMINECTOMY/DECOMPRESSION MICRODISCECTOMY Bilateral  12/14/2012   Procedure: Bilateral lumbar three-four, four-five decompressive laminotomy/foraminotomy;  Surgeon: Charlie Pitter, MD;  Location: [place] NEURO ORS;  Service: Neurosurgery;  Laterality: Bilateral;  . PULSE GENERATOR IMPLANT Bilateral 12/13/2013   Procedure: Bilateral implantable pulse generator placement;  Surgeon: Erline Levine, MD;  Location: [place] NEURO ORS;  Service: Neurosurgery;  Laterality: Bilateral;  Bilateral implantable pulse generator placement  . skin cancer removed     from ears,   12 lft arm  rt leg 15  . SUBTHALAMIC STIMULATOR BATTERY REPLACEMENT Bilateral 07/14/2017   Procedure: BILATERAL IMPLANTED PULSE GENERATOR CHANGE FOR DEEP BRAIN STIMULATOR;  Surgeon: Erline Levine, MD;  Location: [place];  Service: Neurosurgery;  Laterality: Bilateral;  . SUBTHALAMIC STIMULATOR INSERTION Bilateral 12/06/2013   Procedure: SUBTHALAMIC STIMULATOR INSERTION;  Surgeon: Erline Levine, MD;  Location: [place] NEURO ORS;  Service: Neurosurgery;  Laterality: Bilateral;  Bilateral deep brain stimulator placement    There were no vitals filed for this visit.  Subjective Assessment - 09/03/19 0937    Subjective  Patient reports his R quad hurts in distal aspect of leg. No falls over weekend, is now using his front pocket to store wallet per PT request.    Pertinent History  [name] is a 69 y.o. male with a hx of HTN, bradycardia, Parkinson's  s/p deep brain stimulator 11/2013 , HLD, depression, possible seizure disorder, arthritis, depression/PTSD, dysrhythmia, GERD, sleep apnea. Patient was seen by this therapist in years past. Patient had a fall around Halloween of 2020 and admitted for ORIF on 02/18/19, was discharged to SNF and had [place] around Christmas. Had home health therapy for a month and a half. After that tried to go to Outpatient Therapy for 3 sessions at [facility] and was not pleased due to how busy and crowded the location was. Fell last week in bathroom, didn't take walker in, falls about 2x  month. Is very weak in RLE. Patient has a new lift chair and U drive walking. Has a caregiver 4 days a week.    Limitations  Walking;Standing;Sitting;House hold activities;Lifting    How long can you sit comfortably?  not limited with a backrest, without a back rest 3 minutes    How long can you stand comfortably?  3-5 minutes with holding on.    How long can you walk comfortably?  with U step 6 minutes.    Patient Stated Goals  pain reduction of R hip. strength of legs, dance with wife. push lawnmower.    Currently in Pain?  Yes    Pain Score  3     Pain Location  Hip    Pain Orientation  Right    Pain Descriptors / Indicators  Aching;Stabbing    Pain Type  Surgical pain    Pain Onset  More than a month ago    Pain Score  6    Pain Location  Knee    Pain Orientation  Right    Pain Descriptors / Indicators  Aching;Pressure;Stabbing    Pain Type  Chronic pain    Pain Onset  More than a month ago    Pain Frequency  Intermittent          Nustep level 1-2 RPM >60 for 3 minutes for cardiovascular challenge.    Supine position: Hamstring stretch 30-60 second holds RLE  R piriformis stretch 30-60 second hold RLE R IT band stretch 30-60 second hold RLE LE rotation 60 seconds Roller to R quad for reduction of muscle tissue dysfunction for pain relief x 4 minutes  Sidelying: Hip extension with Min A x 12 RLE   Standing in // bars: cues for sequencing, upright posture, close CGA:  Stand at first line, large step to second line with UE support, clap, return to UE support and step back to starting position x 10 each side, initially challenging to maintain stability with clap but improved with repetition as patient improved upright posture   airex pad: one foot on airex pad one foot on 6" step 30 second holds x 2 trials each LE ; SUE support. Min A to R pelvis   2.5 ankle weight:  -Standing hip extension 10x each LE, cues for upright posture -hip abduction 10x each LE, cues for upright  posture   Ambulate forwards length of // bars then backwards to return, cues for equal step length x 8 lengths    Ambulate with walker , 40 ft x 2 trials, cues for upright posture and equal step length, close CGA   Sit to stand reach for a ball and throw at target x 15    Pt educated throughout session about proper posture and technique with exercises. Improved exercise technique, movement at target joints, use of target muscles after min to mod verbal, visual, tactile cues.  Patient presents with excellent  motivation. His pain is more localized to quad region with mobility this session and is partially relieved with use of roller and prolonged hold. Upright posture is challenging to maintain for patient with tendency to return to forward trunk lean. Patient is challenged with gluteal activation of RLE results in limited stabilization in single limb. Patient would benefit from skilled physical therapy to increase strength, stability, balance, and decrease falls risk to improve quality of life.                    PT Education - 09/03/19 0939    Education provided  Yes    Education Details  exercise technique, body mechanics    Person(s) Educated  Patient    Methods  Explanation;Demonstration;Tactile cues;Verbal cues    Comprehension  Verbalized understanding;Returned demonstration;Verbal cues required;Tactile cues required       PT Short Term Goals - 08/22/19 1248      PT SHORT TERM GOAL #1   Title  Pt will perform HEP with family's/aide's supervision, for improved balance, transfers, and gait.    Baseline  5/5: HEP given    Time  4    Period  Weeks    Status  New    Target Date  09/19/19        PT Long Term Goals - 08/22/19 1249      PT LONG TERM GOAL #1   Title  Patient (> 58 years old) will complete five times sit to stand test in < 15 seconds without UE support without LOB indicating an increased LE strength and improved balance.    Baseline  5/5: 19  seconds one posterior LOB    Time  8    Period  Weeks    Status  New    Target Date  10/16/19      PT LONG TERM GOAL #2   Title  Patient will increase 10 meter walk test to >1.19m/s as to improve gait speed for better community ambulation and to reduce fall risk.    Baseline  4/4: 0.71 m/s with U step walker    Time  8    Period  Weeks    Status  New    Target Date  10/16/19      PT LONG TERM GOAL #3   Title  Patient will increase Berg Balance score by > 6 points (32/56) to demonstrate decreased fall risk during functional activities.    Baseline  5/5: 26/56    Time  8    Period  Weeks    Status  New    Target Date  10/16/19      PT LONG TERM GOAL #4   Title  Patient will increase FOTO score to equal to or greater than 31/100  to demonstrate statistically significant improvement in mobility and quality of life.    Baseline  5/5: 12/100    Time  8    Period  Weeks    Status  New    Target Date  10/16/19            Plan - 09/04/19 0757    Clinical Impression Statement  Patient presents with excellent motivation. His pain is more localized to quad region with mobility this session and is partially relieved with use of roller and prolonged hold. Upright posture is challenging to maintain for patient with tendency to return to forward trunk lean. Patient is challenged with gluteal activation of RLE results in limited stabilization in  single limb. Patient would benefit from skilled physical therapy to increase strength, stability, balance, and decrease falls risk to improve quality of life.    Personal Factors and Comorbidities  Age;Comorbidity 3+;Past/Current Experience;Time since onset of injury/illness/exacerbation;Transportation    Comorbidities  athritis, bradycardia, cancer, depression, dysrhythmia, GERD, HTN, Parkinson's, PTSD, SOB, sleep apnea    Examination-Activity Limitations  Bathing;Bed Mobility;Caring for Others;Bend;Dressing;Lift;Locomotion Level;Reach  Overhead;Sit;Squat;Stairs;Stand;Toileting;Transfers    Examination-Participation Restrictions  Church;Cleaning;Community Activity;Interpersonal Relationship;Shop;Volunteer;Yard Work;Other    Stability/Clinical Decision Making  Unstable/Unpredictable    Rehab Potential  Fair    PT Frequency  2x / week    PT Duration  8 weeks    PT Treatment/Interventions  ADLs/Self Care Home Management;Aquatic Therapy;Cryotherapy;Electrical Stimulation;Iontophoresis 4mg /ml Dexamethasone;Moist Heat;Ultrasound;Contrast Bath;DME Instruction;Gait training;Stair training;Functional mobility training;Therapeutic activities;Therapeutic exercise;Balance training;Neuromuscular re-education;Patient/family education;Manual techniques;Wheelchair mobility training;Energy conservation;Passive range of motion;Traction;Orthotic Fit/Training;Dry needling;Vestibular;Visual/perceptual remediation/compensation;Taping;Canalith Repostioning    PT Next Visit Plan  HEP, balance RLE strength, hip extension    PT Home Exercise Plan  [email or web address]    Consulted and Agree with Plan of Care  Patient;Family member/caregiver    Family Member Consulted  wife       Patient will benefit from skilled therapeutic intervention in order to improve the following deficits and impairments:  Abnormal gait, Decreased balance, Decreased coordination, Decreased mobility, Impaired tone, Postural dysfunction, Decreased strength, Decreased safety awareness, Improper body mechanics, Impaired flexibility, Decreased activity tolerance, Decreased endurance, Decreased knowledge of precautions, Difficulty walking, Pain, Cardiopulmonary status limiting activity, Decreased range of motion, Impaired perceived functional ability  Visit Diagnosis: Other abnormalities of gait and mobility  Unsteadiness on feet  Pain in right hip  Parkinson's disease [facility])     Problem List Patient Active Problem List   Diagnosis Date Noted  . E. coli bacteremia 02/23/2019  . Dysphagia  02/23/2019  . Aspiration pneumonia ([place]) 02/21/2019  . Closed right hip fracture ([place]) 02/16/2019  . Rib pain on right side 09/25/2018  . Skin sore 12/01/2017  . Hematoma 10/18/2017  . Fall at home 10/18/2017  . 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 Radicular pain in right arm 10/09/2016  . GERD (gastroesophageal reflux disease) 07/31/2016  . Trochanteric bursitis 03/03/2016  . Left hand pain 10/30/2015  . Fracture, finger, multiple sites 10/30/2015  . Colon cancer screening 07/23/2015  . Healthcare maintenance 07/23/2015  . Gout 02/18/2015  . Depression 01/06/2015  . Irritation of eyelid 08/20/2014  . Dupuytren's contracture 08/20/2014  . Cough 07/21/2014  . Lumbar stenosis with neurogenic claudication 06/13/2014  . Preop cardiovascular exam 05/28/2014  . S/P deep brain stimulator placement 05/08/2014  . Joint pain 01/22/2014  . Medicare annual wellness visit, initial 01/19/2014  . Advance care planning 01/19/2014  . Parkinson's disease ([place]) 12/13/2013  . PTSD (post-traumatic stress disorder) 06/13/2013  . Erectile dysfunction 06/13/2013  . HLD (hyperlipidemia) 06/13/2013  . Hip pain 06/10/2013  . Pain in joint, shoulder region 06/10/2013  . Right leg swelling 06/03/2013  . Essential hypertension 06/03/2013  . Bradycardia by electrocardiogram 06/03/2013  . Obstructive sleep apnea 03/12/2013  . Spinal stenosis, lumbar region, with neurogenic claudication 12/14/2012   Janna Arch, PT, DPT   09/04/2019, 7:58 AM  [place] MAIN [facility] SERVICES [address] [place], [place], [address] Phone: [phone]   Fax:  [phone]  Name: [name] MRN: [id number] Date of Birth: 08/21/1950

## 2019-09-04 ENCOUNTER — Telehealth: Payer: Self-pay

## 2019-09-04 NOTE — Telephone Encounter (Signed)
Volunteer support call made, message left

## 2019-09-05 ENCOUNTER — Ambulatory Visit: Payer: Medicare PPO

## 2019-09-06 ENCOUNTER — Encounter (HOSPITAL_COMMUNITY): Payer: Self-pay

## 2019-09-06 ENCOUNTER — Ambulatory Visit (HOSPITAL_COMMUNITY): Payer: Medicare PPO

## 2019-09-10 ENCOUNTER — Other Ambulatory Visit: Payer: Self-pay

## 2019-09-10 ENCOUNTER — Ambulatory Visit: Payer: Medicare PPO

## 2019-09-10 DIAGNOSIS — R2681 Unsteadiness on feet: Secondary | ICD-10-CM | POA: Diagnosis not present

## 2019-09-10 DIAGNOSIS — R2689 Other abnormalities of gait and mobility: Secondary | ICD-10-CM | POA: Diagnosis not present

## 2019-09-10 DIAGNOSIS — M25551 Pain in right hip: Secondary | ICD-10-CM

## 2019-09-10 DIAGNOSIS — G20 Parkinson's disease: Secondary | ICD-10-CM | POA: Diagnosis not present

## 2019-09-10 DIAGNOSIS — M6281 Muscle weakness (generalized): Secondary | ICD-10-CM | POA: Diagnosis not present

## 2019-09-10 NOTE — Therapy (Signed)
[place] MAIN [facility] SERVICES [address] [place], [place], [address] Phone: [phone]   Fax:  [phone]  Physical Therapy Treatment  Patient Details  Name: [name] MRN: [id number] Date of Birth: [birth year] Referring Provider (PT): Sharolyn Douglas MD    Encounter Date: 09/10/2019  PT End of Session - 09/10/19 1112    Visit Number  4    Number of Visits  16    Date for PT Re-Evaluation  10/16/19    Authorization Type  3/10; eval 5/5/ 21    PT Start Time  1105    PT Stop Time  1145    PT Time Calculation (min)  40 min    Equipment Utilized During Treatment  Gait belt    Activity Tolerance  Patient tolerated treatment well;Patient limited by pain    Behavior During Therapy  [facility] for tasks assessed/performed       Past Medical History:  Diagnosis Date  . Arthritis   . Bradycardia   . Cancer [facility] 2013   skin cancer  . Depression    ptsd  . Dysrhythmia    chronic slow heart rate  . GERD (gastroesophageal reflux disease)   . Headache(784.0)    tension headaches non recent  . History of chicken pox   . History of kidney stones    passed  . Hypertension    treated with HCTZ  . Parkinson's disease ([place])    dx'ed 15 years ago  . PTSD (post-traumatic stress disorder)   . Shortness of breath dyspnea   . Sleep apnea    doesn't use C-pap  . Varicose veins     Past Surgical History:  Procedure Laterality Date  . CHOLECYSTECTOMY N/A 10/22/2014   Procedure: LAPAROSCOPIC CHOLECYSTECTOMY WITH INTRAOPERATIVE CHOLANGIOGRAM;  Surgeon: Dia Crawford III, MD;  Location: [facility] ORS;  Service: General;  Laterality: N/A;  . cyst removed      from lip as a child  . INTRAMEDULLARY (IM) NAIL INTERTROCHANTERIC N/A 02/18/2019   Procedure: INTRAMEDULLARY (IM) NAIL INTERTROCHANTRIC, RIGHT,;  Surgeon: Thornton Park, MD;  Location: [facility] ORS;  Service: Orthopedics;  Laterality: N/A;  . LUMBAR LAMINECTOMY/DECOMPRESSION MICRODISCECTOMY Bilateral  12/14/2012   Procedure: Bilateral lumbar three-four, four-five decompressive laminotomy/foraminotomy;  Surgeon: Charlie Pitter, MD;  Location: [place] NEURO ORS;  Service: Neurosurgery;  Laterality: Bilateral;  . PULSE GENERATOR IMPLANT Bilateral 12/13/2013   Procedure: Bilateral implantable pulse generator placement;  Surgeon: Erline Levine, MD;  Location: [place] NEURO ORS;  Service: Neurosurgery;  Laterality: Bilateral;  Bilateral implantable pulse generator placement  . skin cancer removed     from ears,   12 lft arm  rt leg 15  . SUBTHALAMIC STIMULATOR BATTERY REPLACEMENT Bilateral 07/14/2017   Procedure: BILATERAL IMPLANTED PULSE GENERATOR CHANGE FOR DEEP BRAIN STIMULATOR;  Surgeon: Erline Levine, MD;  Location: [place];  Service: Neurosurgery;  Laterality: Bilateral;  . SUBTHALAMIC STIMULATOR INSERTION Bilateral 12/06/2013   Procedure: SUBTHALAMIC STIMULATOR INSERTION;  Surgeon: Erline Levine, MD;  Location: [place] NEURO ORS;  Service: Neurosurgery;  Laterality: Bilateral;  Bilateral deep brain stimulator placement    There were no vitals filed for this visit.  Subjective Assessment - 09/10/19 1111    Subjective  Pt doing alright today. Wife/pt reports a controlled fall into a litter box, no injury sustained.    Pertinent History  [name] is a 69 y.o. male with a hx of HTN, bradycardia, Parkinson's s/p deep brain stimulator 11/2013 , HLD, depression, possible seizure disorder,  arthritis, depression/PTSD, dysrhythmia, GERD, sleep apnea. Patient was seen by this therapist in years past. Patient had a fall around Halloween of 2020 and admitted for ORIF on 02/18/19, was discharged to SNF and had [place] around Christmas. Had home health therapy for a month and a half. After that tried to go to Outpatient Therapy for 3 sessions at [facility] and was not pleased due to how busy and crowded the location was. Fell last week in bathroom, didn't take walker in, falls about 2x month. Is very weak in RLE. Patient has a new  lift chair and U drive walking. Has a caregiver 4 days a week.    Currently in Pain?  No/denies   Rt hip tingling     INTERVENTION THIS DATE: Nustep level 2 RPM >60 for 4 minutes for cardiovascular challenge; SEAT 11, ARMS 9  ?  Supine position:  Hamstring stretch 2X30sec, author facilitated  Rt FABER stretch 2X30sec author facilitated  Rt SKTC 2x30sec, author facilitated  Rt Frog groin stretch 2x30sec  Hooklying bridge 2x10  RLE bent knee raise (marching) 1x15 BLE SAQ on 9" bolster (5lb AW) 1x15  RLE SAQ on 9" bolster 10lbAW 2x10 Hooklying Band clam 2BTB 2x10, ball between feet -Standing hip extension 1x15, RLE, cued for forward flexed posture to allow greater arc hip extension ?-later side stepping in // bars with band ? 1x80ft bilat, BTB at knees     PT Short Term Goals - 08/22/19 1248      PT SHORT TERM GOAL #1   Title  Pt will perform HEP with family's/aide's supervision, for improved balance, transfers, and gait.    Baseline  5/5: HEP given    Time  4    Period  Weeks    Status  New    Target Date  09/19/19        PT Long Term Goals - 08/22/19 1249      PT LONG TERM GOAL #1   Title  Patient (> 30 years old) will complete five times sit to stand test in < 15 seconds without UE support without LOB indicating an increased LE strength and improved balance.    Baseline  5/5: 19 seconds one posterior LOB    Time  8    Period  Weeks    Status  New    Target Date  10/16/19      PT LONG TERM GOAL #2   Title  Patient will increase 10 meter walk test to >1.57m/s as to improve gait speed for better community ambulation and to reduce fall risk.    Baseline  4/4: 0.71 m/s with U step walker    Time  8    Period  Weeks    Status  New    Target Date  10/16/19      PT LONG TERM GOAL #3   Title  Patient will increase Berg Balance score by > 6 points (32/56) to demonstrate decreased fall risk during functional activities.    Baseline  5/5: 26/56    Time  8    Period   Weeks    Status  New    Target Date  10/16/19      PT LONG TERM GOAL #4   Title  Patient will increase FOTO score to equal to or greater than 31/100  to demonstrate statistically significant improvement in mobility and quality of life.    Baseline  5/5: 12/100    Time  8  Period  Weeks    Status  New    Target Date  10/16/19            Plan - 09/10/19 1113    Clinical Impression Statement Pt able to complete entire session as planned with rest breaks provided as needed. Pt maintains high level of focus and motivation. Extensive verbal, visual, and tactile cues are provided for most accurate form possible. Pt has difficulty with eccentric control during banded side stepping, but is able ot improve it by 75% when cued Overall pt continues to make steady progress toward treatment goals.    Personal Factors and Comorbidities  Age;Comorbidity 3+;Past/Current Experience;Time since onset of injury/illness/exacerbation;Transportation    Comorbidities  athritis, bradycardia, cancer, depression, dysrhythmia, GERD, HTN, Parkinson's, PTSD, SOB, sleep apnea    Examination-Activity Limitations  Bathing;Bed Mobility;Caring for Others;Bend;Dressing;Lift;Locomotion Level;Reach Overhead;Sit;Squat;Stairs;Stand;Toileting;Transfers    Examination-Participation Restrictions  Church;Cleaning;Community Activity;Interpersonal Relationship;Shop;Volunteer;Yard Work;Other    Stability/Clinical Decision Making  Unstable/Unpredictable    Clinical Decision Making  High    Rehab Potential  Fair    PT Frequency  2x / week    PT Duration  8 weeks    PT Treatment/Interventions  ADLs/Self Care Home Management;Aquatic Therapy;Cryotherapy;Electrical Stimulation;Iontophoresis 4mg /ml Dexamethasone;Moist Heat;Ultrasound;Contrast Bath;DME Instruction;Gait training;Stair training;Functional mobility training;Therapeutic activities;Therapeutic exercise;Balance training;Neuromuscular re-education;Patient/family education;Manual  techniques;Wheelchair mobility training;Energy conservation;Passive range of motion;Traction;Orthotic Fit/Training;Dry needling;Vestibular;Visual/perceptual remediation/compensation;Taping;Canalith Repostioning    PT Next Visit Plan  HEP, balance RLE strength, hip extension    PT Home Exercise Plan  [email or web address]    Consulted and Agree with Plan of Care  Patient;Family member/caregiver    Family Member Consulted  wife       Patient will benefit from skilled therapeutic intervention in order to improve the following deficits and impairments:  Abnormal gait, Decreased balance, Decreased coordination, Decreased mobility, Impaired tone, Postural dysfunction, Decreased strength, Decreased safety awareness, Improper body mechanics, Impaired flexibility, Decreased activity tolerance, Decreased endurance, Decreased knowledge of precautions, Difficulty walking, Pain, Cardiopulmonary status limiting activity, Decreased range of motion, Impaired perceived functional ability  Visit Diagnosis: Unsteadiness on feet  Pain in right hip  Other abnormalities of gait and mobility  Muscle weakness (generalized)  Parkinson's disease ([place])     Problem List Patient Active Problem List   Diagnosis Date Noted  . E. coli bacteremia 02/23/2019  . Dysphagia 02/23/2019  . Aspiration pneumonia ([place]) 02/21/2019  . Closed right hip fracture ([place]) 02/16/2019  . Rib pain on right side 09/25/2018  . Skin sore 12/01/2017  . Hematoma 10/18/2017  . Fall at home 10/18/2017  . 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 Radicular pain in right arm 10/09/2016  . GERD (gastroesophageal reflux disease) 07/31/2016  . Trochanteric bursitis 03/03/2016  . Left hand pain 10/30/2015  . Fracture, finger, multiple sites 10/30/2015  . Colon cancer screening 07/23/2015  . Healthcare maintenance 07/23/2015  . Gout 02/18/2015  . Depression 01/06/2015  . Irritation of eyelid 08/20/2014  . Dupuytren's contracture 08/20/2014  . Cough  07/21/2014  . Lumbar stenosis with neurogenic claudication 06/13/2014  . Preop cardiovascular exam 05/28/2014  . S/P deep brain stimulator placement 05/08/2014  . Joint pain 01/22/2014  . Medicare annual wellness visit, initial 01/19/2014  . Advance care planning 01/19/2014  . Parkinson's disease ([place]) 12/13/2013  . PTSD (post-traumatic stress disorder) 06/13/2013  . Erectile dysfunction 06/13/2013  . HLD (hyperlipidemia) 06/13/2013  . Hip pain 06/10/2013  . Pain in joint, shoulder region 06/10/2013  . Right leg swelling 06/03/2013  . Essential hypertension 06/03/2013  . Bradycardia  by electrocardiogram 06/03/2013  . Obstructive sleep apnea 03/12/2013  . Spinal stenosis, lumbar region, with neurogenic claudication 12/14/2012   11:42 AM, 09/10/19 Etta Grandchild, PT, DPT Physical Therapist - [place] [phone]     Etta Grandchild 09/10/2019, 11:28 AM  [place] [address] [place], [place], [address] Phone: [phone]   Fax:  [phone]  Name: [name] MRN: [id number] Date of Birth: [birth year]

## 2019-09-12 ENCOUNTER — Ambulatory Visit: Payer: Medicare PPO

## 2019-09-12 DIAGNOSIS — R2689 Other abnormalities of gait and mobility: Secondary | ICD-10-CM

## 2019-09-12 DIAGNOSIS — G20 Parkinson's disease: Secondary | ICD-10-CM | POA: Diagnosis not present

## 2019-09-12 DIAGNOSIS — R2681 Unsteadiness on feet: Secondary | ICD-10-CM | POA: Diagnosis not present

## 2019-09-12 DIAGNOSIS — M25551 Pain in right hip: Secondary | ICD-10-CM | POA: Diagnosis not present

## 2019-09-12 DIAGNOSIS — M6281 Muscle weakness (generalized): Secondary | ICD-10-CM

## 2019-09-12 NOTE — Therapy (Signed)
[place] MAIN [facility] SERVICES [address] [place], [place], [address] Phone: [phone]   Fax:  [phone]  Physical Therapy Treatment  Patient Details  Name: [name] MRN: [id number] Date of Birth: [birth year] Referring Provider (PT): Sharolyn Douglas MD    Encounter Date: 09/12/2019  PT End of Session - 09/12/19 1808    Visit Number  5    Number of Visits  16    Date for PT Re-Evaluation  10/16/19    Authorization Type  5/10; eval 5/5/ 21    PT Start Time  0930    PT Stop Time  1014    PT Time Calculation (min)  44 min    Equipment Utilized During Treatment  Gait belt    Activity Tolerance  Patient tolerated treatment well;Patient limited by pain    Behavior During Therapy  [facility] for tasks assessed/performed       Past Medical History:  Diagnosis Date  . Arthritis   . Bradycardia   . Cancer [facility]) 2013   skin cancer  . Depression    ptsd  . Dysrhythmia    chronic slow heart rate  . GERD (gastroesophageal reflux disease)   . Headache(784.0)    tension headaches non recent  . History of chicken pox   . History of kidney stones    passed  . Hypertension    treated with HCTZ  . Parkinson's disease ([place])    dx'ed 15 years ago  . PTSD (post-traumatic stress disorder)   . Shortness of breath dyspnea   . Sleep apnea    doesn't use C-pap  . Varicose veins     Past Surgical History:  Procedure Laterality Date  . CHOLECYSTECTOMY N/A 10/22/2014   Procedure: LAPAROSCOPIC CHOLECYSTECTOMY WITH INTRAOPERATIVE CHOLANGIOGRAM;  Surgeon: Dia Crawford III, MD;  Location: [facility] ORS;  Service: General;  Laterality: N/A;  . cyst removed      from lip as a child  . INTRAMEDULLARY (IM) NAIL INTERTROCHANTERIC N/A 02/18/2019   Procedure: INTRAMEDULLARY (IM) NAIL INTERTROCHANTRIC, RIGHT,;  Surgeon: Thornton Park, MD;  Location: [facility] ORS;  Service: Orthopedics;  Laterality: N/A;  . LUMBAR LAMINECTOMY/DECOMPRESSION MICRODISCECTOMY Bilateral  12/14/2012   Procedure: Bilateral lumbar three-four, four-five decompressive laminotomy/foraminotomy;  Surgeon: Charlie Pitter, MD;  Location: [place] NEURO ORS;  Service: Neurosurgery;  Laterality: Bilateral;  . PULSE GENERATOR IMPLANT Bilateral 12/13/2013   Procedure: Bilateral implantable pulse generator placement;  Surgeon: Erline Levine, MD;  Location: [place] NEURO ORS;  Service: Neurosurgery;  Laterality: Bilateral;  Bilateral implantable pulse generator placement  . skin cancer removed     from ears,   12 lft arm  rt leg 15  . SUBTHALAMIC STIMULATOR BATTERY REPLACEMENT Bilateral 07/14/2017   Procedure: BILATERAL IMPLANTED PULSE GENERATOR CHANGE FOR DEEP BRAIN STIMULATOR;  Surgeon: Erline Levine, MD;  Location: [place];  Service: Neurosurgery;  Laterality: Bilateral;  . SUBTHALAMIC STIMULATOR INSERTION Bilateral 12/06/2013   Procedure: SUBTHALAMIC STIMULATOR INSERTION;  Surgeon: Erline Levine, MD;  Location: [place] NEURO ORS;  Service: Neurosurgery;  Laterality: Bilateral;  Bilateral deep brain stimulator placement    There were no vitals filed for this visit.  Subjective Assessment - 09/12/19 1806    Subjective  Patient presents with wife. Reports he fell the other day when he let go of the walker in the bathroom. slight discomfort of hip but no pain.    Pertinent History  [name] is a 69 y.o. male with a hx of HTN, bradycardia, Parkinson's  s/p deep brain stimulator 11/2013 , HLD, depression, possible seizure disorder, arthritis, depression/PTSD, dysrhythmia, GERD, sleep apnea. Patient was seen by this therapist in years past. Patient had a fall around Halloween of 2020 and admitted for ORIF on 02/18/19, was discharged to SNF and had [place] around Christmas. Had home health therapy for a month and a half. After that tried to go to Outpatient Therapy for 3 sessions at [facility] and was not pleased due to how busy and crowded the location was. Fell last week in bathroom, didn't take walker in, falls about 2x  month. Is very weak in RLE. Patient has a new lift chair and U drive walking. Has a caregiver 4 days a week.    Currently in Pain?  No/denies           Treatment: Supine   Hamstring stretch 30-60 second holds RLE  R piriformis stretch 30-60 second hold RLE R IT band stretch 30-60 second hold RLE LE rotation 60 seconds Roller to R quad for reduction of muscle tissue dysfunction for pain relief x 4 minutes   Standing:  Walk with board between feet with 2lb ankle weight x 4 trials Sit to stand walk to cone 10 ft away, negotiate turning around it 10x, progressed to figure 8 around two cones x 8 trials.  2lb ankle weight walk around room with rollator: cues for upright posture with widening BOS x 2 trials of 96 ft  sit to stand from plinth table 10x with modified nose over toes partial stand first 10, full stand second set of 10 Stand and reach laterally to balls on a surface slightly outside BOS 15x   Seated:  RTB 4 way ankle; 15x each direction GTB adduction against PT resistance 10x each LE       Pt educated throughout session about proper posture and technique with exercises. Improved exercise technique, movement at target joints, use of target muscles after min to mod verbal, visual, tactile cues.  Patient continues to present with excellent motivation. Today's session focused on equal step length and width of base of support with improving mechanics with visual cueing and repetition. Slight reaches outside BOS were tolerated with little to no deficit. Patient would benefit from skilled physical therapy to increase strength, stability, balance, and decrease falls risk to improve quality of life.       PT Education - 09/12/19 1807    Education provided  Yes    Education Details  exercise technique, posture, ambulation    Person(s) Educated  Patient    Methods  Explanation;Demonstration;Tactile cues;Verbal cues    Comprehension  Verbalized understanding;Returned  demonstration;Verbal cues required;Tactile cues required       PT Short Term Goals - 08/22/19 1248      PT SHORT TERM GOAL #1   Title  Pt will perform HEP with family's/aide's supervision, for improved balance, transfers, and gait.    Baseline  5/5: HEP given    Time  4    Period  Weeks    Status  New    Target Date  09/19/19        PT Long Term Goals - 08/22/19 1249      PT LONG TERM GOAL #1   Title  Patient (> 69 years old) will complete five times sit to stand test in < 15 seconds without UE support without LOB indicating an increased LE strength and improved balance.    Baseline  5/5: 19 seconds one posterior LOB  Time  8    Period  Weeks    Status  New    Target Date  10/16/19      PT LONG TERM GOAL #2   Title  Patient will increase 10 meter walk test to >1.65m/s as to improve gait speed for better community ambulation and to reduce fall risk.    Baseline  4/4: 0.71 m/s with U step walker    Time  8    Period  Weeks    Status  New    Target Date  10/16/19      PT LONG TERM GOAL #3   Title  Patient will increase Berg Balance score by > 6 points (32/56) to demonstrate decreased fall risk during functional activities.    Baseline  5/5: 26/56    Time  8    Period  Weeks    Status  New    Target Date  10/16/19      PT LONG TERM GOAL #4   Title  Patient will increase FOTO score to equal to or greater than 31/100  to demonstrate statistically significant improvement in mobility and quality of life.    Baseline  5/5: 12/100    Time  8    Period  Weeks    Status  New    Target Date  10/16/19            Plan - 09/12/19 1808    Clinical Impression Statement  Patient continues to present with excellent motivation. Today's session focused on equal step length and width of base of support with improving mechanics with visual cueing and repetition. Slight reaches outside BOS were tolerated with little to no deficit. Patient would benefit from skilled physical therapy  to increase strength, stability, balance, and decrease falls risk to improve quality of life.    Personal Factors and Comorbidities  Age;Comorbidity 3+;Past/Current Experience;Time since onset of injury/illness/exacerbation;Transportation    Comorbidities  athritis, bradycardia, cancer, depression, dysrhythmia, GERD, HTN, Parkinson's, PTSD, SOB, sleep apnea    Examination-Activity Limitations  Bathing;Bed Mobility;Caring for Others;Bend;Dressing;Lift;Locomotion Level;Reach Overhead;Sit;Squat;Stairs;Stand;Toileting;Transfers    Examination-Participation Restrictions  Church;Cleaning;Community Activity;Interpersonal Relationship;Shop;Volunteer;Yard Work;Other    Stability/Clinical Decision Making  Unstable/Unpredictable    Rehab Potential  Fair    PT Frequency  2x / week    PT Duration  8 weeks    PT Treatment/Interventions  ADLs/Self Care Home Management;Aquatic Therapy;Cryotherapy;Electrical Stimulation;Iontophoresis 4mg /ml Dexamethasone;Moist Heat;Ultrasound;Contrast Bath;DME Instruction;Gait training;Stair training;Functional mobility training;Therapeutic activities;Therapeutic exercise;Balance training;Neuromuscular re-education;Patient/family education;Manual techniques;Wheelchair mobility training;Energy conservation;Passive range of motion;Traction;Orthotic Fit/Training;Dry needling;Vestibular;Visual/perceptual remediation/compensation;Taping;Canalith Repostioning    PT Next Visit Plan  HEP, balance RLE strength, hip extension    PT Home Exercise Plan  [email or web address]    Consulted and Agree with Plan of Care  Patient;Family member/caregiver    Family Member Consulted  wife       Patient will benefit from skilled therapeutic intervention in order to improve the following deficits and impairments:  Abnormal gait, Decreased balance, Decreased coordination, Decreased mobility, Impaired tone, Postural dysfunction, Decreased strength, Decreased safety awareness, Improper body mechanics, Impaired  flexibility, Decreased activity tolerance, Decreased endurance, Decreased knowledge of precautions, Difficulty walking, Pain, Cardiopulmonary status limiting activity, Decreased range of motion, Impaired perceived functional ability  Visit Diagnosis: Unsteadiness on feet  Pain in right hip  Other abnormalities of gait and mobility  Muscle weakness (generalized)     Problem List Patient Active Problem List   Diagnosis Date Noted  . E. coli bacteremia 02/23/2019  . Dysphagia 02/23/2019  .  Aspiration pneumonia ([place]) 02/21/2019  . Closed right hip fracture ([place]) 02/16/2019  . Rib pain on right side 09/25/2018  . Skin sore 12/01/2017  . Hematoma 10/18/2017  . Fall at home 10/18/2017  . 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 Radicular pain in right arm 10/09/2016  . GERD (gastroesophageal reflux disease) 07/31/2016  . Trochanteric bursitis 03/03/2016  . Left hand pain 10/30/2015  . Fracture, finger, multiple sites 10/30/2015  . Colon cancer screening 07/23/2015  . Healthcare maintenance 07/23/2015  . Gout 02/18/2015  . Depression 01/06/2015  . Irritation of eyelid 08/20/2014  . Dupuytren's contracture 08/20/2014  . Cough 07/21/2014  . Lumbar stenosis with neurogenic claudication 06/13/2014  . Preop cardiovascular exam 05/28/2014  . S/P deep brain stimulator placement 05/08/2014  . Joint pain 01/22/2014  . Medicare annual wellness visit, initial 01/19/2014  . Advance care planning 01/19/2014  . Parkinson's disease ([place]) 12/13/2013  . PTSD (post-traumatic stress disorder) 06/13/2013  . Erectile dysfunction 06/13/2013  . HLD (hyperlipidemia) 06/13/2013  . Hip pain 06/10/2013  . Pain in joint, shoulder region 06/10/2013  . Right leg swelling 06/03/2013  . Essential hypertension 06/03/2013  . Bradycardia by electrocardiogram 06/03/2013  . Obstructive sleep apnea 03/12/2013  . Spinal stenosis, lumbar region, with neurogenic claudication 12/14/2012    Janna Arch, PT,  DPT   09/12/2019, 6:10 PM  [place] MAIN [facility] SERVICES [address] [place], [place], [address] Phone: [phone]   Fax:  [phone]  Name: GARMON SHINA MRN: [id number] Date of Birth: October 11, 1950

## 2019-09-16 ENCOUNTER — Ambulatory Visit: Payer: Medicare PPO | Admitting: Psychology

## 2019-09-18 ENCOUNTER — Ambulatory Visit: Payer: Medicare PPO | Attending: Neurology

## 2019-09-18 ENCOUNTER — Other Ambulatory Visit: Payer: Self-pay

## 2019-09-18 DIAGNOSIS — R2681 Unsteadiness on feet: Secondary | ICD-10-CM

## 2019-09-18 DIAGNOSIS — G20 Parkinson's disease: Secondary | ICD-10-CM | POA: Diagnosis not present

## 2019-09-18 DIAGNOSIS — M25551 Pain in right hip: Secondary | ICD-10-CM

## 2019-09-18 DIAGNOSIS — M6281 Muscle weakness (generalized): Secondary | ICD-10-CM | POA: Diagnosis not present

## 2019-09-18 DIAGNOSIS — R2689 Other abnormalities of gait and mobility: Secondary | ICD-10-CM

## 2019-09-18 NOTE — Therapy (Signed)
[place] MAIN [facility] SERVICES [place], [place], [address] Phone: [phone]   Fax:  [phone]  Physical Therapy Treatment  Patient Details  Name: [name] MRN: [id number] Date of Birth: 1951-02-08 Referring Provider (PT): Sharolyn Douglas MD    Encounter Date: 09/18/2019  PT End of Session - 09/18/19 1725    Visit Number  6    Number of Visits  16    Date for PT Re-Evaluation  10/16/19    Authorization Type  6/10; eval 5/5/ 21    PT Start Time  1015    PT Stop Time  1100    PT Time Calculation (min)  45 min    Equipment Utilized During Treatment  Gait belt    Activity Tolerance  Patient tolerated treatment well;Patient limited by pain    Behavior During Therapy  [facility] for tasks assessed/performed       Past Medical History:  Diagnosis Date  . Arthritis   . Bradycardia   . Cancer [facility]) 2013   skin cancer  . Depression    ptsd  . Dysrhythmia    chronic slow heart rate  . GERD (gastroesophageal reflux disease)   . Headache(784.0)    tension headaches non recent  . History of chicken pox   . History of kidney stones    passed  . Hypertension    treated with HCTZ  . Parkinson's disease ([place])    dx'ed 15 years ago  . PTSD (post-traumatic stress disorder)   . Shortness of breath dyspnea   . Sleep apnea    doesn't use C-pap  . Varicose veins     Past Surgical History:  Procedure Laterality Date  . CHOLECYSTECTOMY N/A 10/22/2014   Procedure: LAPAROSCOPIC CHOLECYSTECTOMY WITH INTRAOPERATIVE CHOLANGIOGRAM;  Surgeon: Dia Crawford III, MD;  Location: [facility] ORS;  Service: General;  Laterality: N/A;  . cyst removed      from lip as a child  . INTRAMEDULLARY (IM) NAIL INTERTROCHANTERIC N/A 02/18/2019   Procedure: INTRAMEDULLARY (IM) NAIL INTERTROCHANTRIC, RIGHT,;  Surgeon: Thornton Park, MD;  Location: [facility] ORS;  Service: Orthopedics;  Laterality: N/A;  . LUMBAR LAMINECTOMY/DECOMPRESSION MICRODISCECTOMY Bilateral  12/14/2012   Procedure: Bilateral lumbar three-four, four-five decompressive laminotomy/foraminotomy;  Surgeon: Charlie Pitter, MD;  Location: [place] NEURO ORS;  Service: Neurosurgery;  Laterality: Bilateral;  . PULSE GENERATOR IMPLANT Bilateral 12/13/2013   Procedure: Bilateral implantable pulse generator placement;  Surgeon: Erline Levine, MD;  Location: [place] NEURO ORS;  Service: Neurosurgery;  Laterality: Bilateral;  Bilateral implantable pulse generator placement  . skin cancer removed     from ears,   12 lft arm  rt leg 15  . SUBTHALAMIC STIMULATOR BATTERY REPLACEMENT Bilateral 07/14/2017   Procedure: BILATERAL IMPLANTED PULSE GENERATOR CHANGE FOR DEEP BRAIN STIMULATOR;  Surgeon: Erline Levine, MD;  Location: [place];  Service: Neurosurgery;  Laterality: Bilateral;  . SUBTHALAMIC STIMULATOR INSERTION Bilateral 12/06/2013   Procedure: SUBTHALAMIC STIMULATOR INSERTION;  Surgeon: Erline Levine, MD;  Location: [place] NEURO ORS;  Service: Neurosurgery;  Laterality: Bilateral;  Bilateral deep brain stimulator placement    There were no vitals filed for this visit.  Subjective Assessment - 09/18/19 1723    Subjective  Patient presents with wife and caregiver/aide. Fell in the kitchen when he tripped over his walker.    Pertinent History  [name] is a 69 y.o. male with a hx of HTN, bradycardia, Parkinson's s/p deep brain stimulator 11/2013 , HLD, depression, possible seizure disorder,  arthritis, depression/PTSD, dysrhythmia, GERD, sleep apnea. Patient was seen by this therapist in years past. Patient had a fall around Halloween of 2020 and admitted for ORIF on 02/18/19, was discharged to SNF and had Gerster around Christmas. Had home health therapy for a month and a half. After that tried to go to Outpatient Therapy for 3 sessions at [facility] and was not pleased due to how busy and crowded the location was. Fell last week in bathroom, didn't take walker in, falls about 2x month. Is very weak in RLE. Patient has a  new lift chair and U drive walking. Has a caregiver 4 days a week.    Limitations  Walking;Standing;Sitting;House hold activities;Lifting    How long can you sit comfortably?  not limited with a backrest, without a back rest 3 minutes    How long can you stand comfortably?  3-5 minutes with holding on.    How long can you walk comfortably?  with U step 6 minutes.    Patient Stated Goals  pain reduction of R hip. strength of legs, dance with wife. push lawnmower.    Currently in Pain?  Yes    Pain Score  2     Pain Location  Back    Pain Orientation  Lower    Pain Descriptors / Indicators  Aching    Pain Type  Chronic pain    Pain Onset  More than a month ago    Pain Frequency  Intermittent          Treatment: Supine: educated aide on performing at home  Hamstring stretch 30-60 second holds RLE  R piriformis stretch 30-60 second hold RLE R IT band stretch 30-60 second hold RLE LE rotation 60 seconds Roller to R quad for reduction of muscle tissue dysfunction for pain relief x 4 minutes   Sidelying:  -hip extension 10x min A RLE  Standing in // bars: cues for sequencing, upright posture, close CGA:  Stand at first line, large step to second line with UE support, clap, return to UE support and step back to starting position x 10 each side, initially challenging to maintain stability with clap but improved with repetition as patient improved upright posture  airex pad: one foot on airex pad one foot on 6" step 30 second holds x 2 trials each LE; SUE support. Min A to R pelvis  2.5 ankle weight:  -Standing hip extension 10x each LE, cues for upright posture -hip abduction 10x each LE, cues for upright posture   Ambulate forwards length of // bars then backwards to return, cues for equal step length x 8 lengths   Ambulate with walker , 40 ft x 2 trials, cues for upright posture and equal step length, close CGA  Sit to stand reach for a ball and throw at target x  15  Pt educated throughout session about proper posture and technique with exercises. Improved exercise technique, movement at target joints, use of target muscles after min to mod verbal, visual, tactile cues.  Seated: RTB 4 way ankle; 15x each direction Upright posture outward then forward reach 10x STS reach for ball toss into hoop x 18       Pt educated throughout session about proper posture and technique with exercises. Improved exercise technique, movement at target joints, use of target muscles after min to mod verbal, visual, tactile cues.  PT Education - 09/18/19 1724    Education provided  Yes    Education Details  exercise technique, body mechanics, stretches for aide    Person(s) Educated  Patient;Caregiver(s)    Methods  Explanation;Demonstration;Tactile cues;Verbal cues    Comprehension  Verbalized understanding;Returned demonstration;Verbal cues required;Tactile cues required       PT Short Term Goals - 08/22/19 1248      PT SHORT TERM GOAL #1   Title  Pt will perform HEP with family's/aide's supervision, for improved balance, transfers, and gait.    Baseline  5/5: HEP given    Time  4    Period  Weeks    Status  New    Target Date  09/19/19        PT Long Term Goals - 08/22/19 1249      PT LONG TERM GOAL #1   Title  Patient (> 80 years old) will complete five times sit to stand test in < 15 seconds without UE support without LOB indicating an increased LE strength and improved balance.    Baseline  5/5: 19 seconds one posterior LOB    Time  8    Period  Weeks    Status  New    Target Date  10/16/19      PT LONG TERM GOAL #2   Title  Patient will increase 10 meter walk test to >1.42m/s as to improve gait speed for better community ambulation and to reduce fall risk.    Baseline  4/4: 0.71 m/s with U step walker    Time  8    Period  Weeks    Status  New    Target Date  10/16/19      PT LONG TERM GOAL  #3   Title  Patient will increase Berg Balance score by > 6 points (32/56) to demonstrate decreased fall risk during functional activities.    Baseline  5/5: 26/56    Time  8    Period  Weeks    Status  New    Target Date  10/16/19      PT LONG TERM GOAL #4   Title  Patient will increase FOTO score to equal to or greater than 31/100  to demonstrate statistically significant improvement in mobility and quality of life.    Baseline  5/5: 12/100    Time  8    Period  Weeks    Status  New    Target Date  10/16/19            Plan - 09/18/19 1728    Clinical Impression Statement  Patient continues to present with excellent motivation throughout physical therapy session. His hip is sore but not painful allowing for weightbearing interventions. His aide was educated on strengthening and stretching at home. Patient would benefit from skilled physical therapy to increase strength, stability, balance, and decrease falls risk to improve quality of life.    Personal Factors and Comorbidities  Age;Comorbidity 3+;Past/Current Experience;Time since onset of injury/illness/exacerbation;Transportation    Comorbidities  athritis, bradycardia, cancer, depression, dysrhythmia, GERD, HTN, Parkinson's, PTSD, SOB, sleep apnea    Examination-Activity Limitations  Bathing;Bed Mobility;Caring for Others;Bend;Dressing;Lift;Locomotion Level;Reach Overhead;Sit;Squat;Stairs;Stand;Toileting;Transfers    Examination-Participation Restrictions  Church;Cleaning;Community Activity;Interpersonal Relationship;Shop;Volunteer;Yard Work;Other    Stability/Clinical Decision Making  Unstable/Unpredictable    Rehab Potential  Fair    PT Frequency  2x / week    PT Duration  8 weeks    PT Treatment/Interventions  ADLs/Self Care Home Management;Aquatic  Therapy;Cryotherapy;Electrical Stimulation;Iontophoresis 4mg /ml Dexamethasone;Moist Heat;Ultrasound;Contrast Bath;DME Instruction;Gait training;Stair training;Functional mobility  training;Therapeutic activities;Therapeutic exercise;Balance training;Neuromuscular re-education;Patient/family education;Manual techniques;Wheelchair mobility training;Energy conservation;Passive range of motion;Traction;Orthotic Fit/Training;Dry needling;Vestibular;Visual/perceptual remediation/compensation;Taping;Canalith Repostioning    PT Next Visit Plan  HEP, balance RLE strength, hip extension    PT Home Exercise Plan  [email or web address]    Consulted and Agree with Plan of Care  Patient;Family member/caregiver    Family Member Consulted  wife       Patient will benefit from skilled therapeutic intervention in order to improve the following deficits and impairments:  Abnormal gait, Decreased balance, Decreased coordination, Decreased mobility, Impaired tone, Postural dysfunction, Decreased strength, Decreased safety awareness, Improper body mechanics, Impaired flexibility, Decreased activity tolerance, Decreased endurance, Decreased knowledge of precautions, Difficulty walking, Pain, Cardiopulmonary status limiting activity, Decreased range of motion, Impaired perceived functional ability  Visit Diagnosis: Unsteadiness on feet  Pain in right hip  Other abnormalities of gait and mobility  Muscle weakness (generalized)  Parkinson's disease ([place])     Problem List Patient Active Problem List   Diagnosis Date Noted  . E. coli bacteremia 02/23/2019  . Dysphagia 02/23/2019  . Aspiration pneumonia ([place]) 02/21/2019  . Closed right hip fracture ([place]) 02/16/2019  . Rib pain on right side 09/25/2018  . Skin sore 12/01/2017  . Hematoma 10/18/2017  . Fall at home 10/18/2017  . 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 Radicular pain in right arm 10/09/2016  . GERD (gastroesophageal reflux disease) 07/31/2016  . Trochanteric bursitis 03/03/2016  . Left hand pain 10/30/2015  . Fracture, finger, multiple sites 10/30/2015  . Colon cancer screening 07/23/2015  . Healthcare maintenance 07/23/2015  .  Gout 02/18/2015  . Depression 01/06/2015  . Irritation of eyelid 08/20/2014  . Dupuytren's contracture 08/20/2014  . Cough 07/21/2014  . Lumbar stenosis with neurogenic claudication 06/13/2014  . Preop cardiovascular exam 05/28/2014  . S/P deep brain stimulator placement 05/08/2014  . Joint pain 01/22/2014  . Medicare annual wellness visit, initial 01/19/2014  . Advance care planning 01/19/2014  . Parkinson's disease ([place]) 12/13/2013  . PTSD (post-traumatic stress disorder) 06/13/2013  . Erectile dysfunction 06/13/2013  . HLD (hyperlipidemia) 06/13/2013  . Hip pain 06/10/2013  . Pain in joint, shoulder region 06/10/2013  . Right leg swelling 06/03/2013  . Essential hypertension 06/03/2013  . Bradycardia by electrocardiogram 06/03/2013  . Obstructive sleep apnea 03/12/2013  . Spinal stenosis, lumbar region, with neurogenic claudication 12/14/2012   Janna Arch, PT, DPT   09/18/2019, 5:28 PM  [place] MAIN [facility] SERVICES [address] [place], [place], [address] Phone: [phone]   Fax:  [phone]  Name: AHAD ARGOMANIZ MRN: [id number] Date of Birth: 1951-03-19

## 2019-09-23 ENCOUNTER — Other Ambulatory Visit: Payer: Self-pay

## 2019-09-23 ENCOUNTER — Ambulatory Visit: Payer: Medicare PPO

## 2019-09-23 DIAGNOSIS — M6281 Muscle weakness (generalized): Secondary | ICD-10-CM | POA: Diagnosis not present

## 2019-09-23 DIAGNOSIS — R2689 Other abnormalities of gait and mobility: Secondary | ICD-10-CM | POA: Diagnosis not present

## 2019-09-23 DIAGNOSIS — R2681 Unsteadiness on feet: Secondary | ICD-10-CM

## 2019-09-23 DIAGNOSIS — M25551 Pain in right hip: Secondary | ICD-10-CM

## 2019-09-23 DIAGNOSIS — G20 Parkinson's disease: Secondary | ICD-10-CM | POA: Diagnosis not present

## 2019-09-23 NOTE — Patient Instructions (Signed)
Knee Flexion: Resisted (Sitting)    Pull back on theraband x 10 x 2 sets   http://orth.exer.us/694   Copyright  VHI. All rights reserved.  HIP / KNEE: Flexion - Sitting    Raise knee to chest. Keep back straight, knee bent. __10_ reps per set, __1_ sets per day, ___7 days per week  Copyright  VHI. All rights reserved.  Knee Extension: Sitting (Single Leg)    Sitting, face away from anchor, knee flexed, tubing looped around foot. Extend knee. Repeat _10_ times per set. Repeat with other leg. Do _2_ sets per session. Do _7_ sessions per week. Anchor Height: Mid-shin  http://tub.exer.us/55   Copyright  VHI. All rights reserved.

## 2019-09-23 NOTE — Therapy (Signed)
[place] MAIN [facility] SERVICES [address] [place], [place], [address] Phone: [phone]   Fax:  [phone]  Physical Therapy Treatment  Patient Details  Name: [name] MRN: [id number] Date of Birth: January 18, 1951 Referring Provider (PT): Sharolyn Douglas MD    Encounter Date: 09/23/2019  PT End of Session - 09/23/19 1256    Visit Number  7    Number of Visits  16    Date for PT Re-Evaluation  10/16/19    Authorization Type  6/10; eval 5/5/ 21    PT Start Time  1015    PT Stop Time  1100    PT Time Calculation (min)  45 min    Equipment Utilized During Treatment  Gait belt    Activity Tolerance  Patient tolerated treatment well;Patient limited by pain    Behavior During Therapy  [facility] for tasks assessed/performed       Past Medical History:  Diagnosis Date  . Arthritis   . Bradycardia   . Cancer [facility]) 2013   skin cancer  . Depression    ptsd  . Dysrhythmia    chronic slow heart rate  . GERD (gastroesophageal reflux disease)   . Headache(784.0)    tension headaches non recent  . History of chicken pox   . History of kidney stones    passed  . Hypertension    treated with HCTZ  . Parkinson's disease ([place])    dx'ed 15 years ago  . PTSD (post-traumatic stress disorder)   . Shortness of breath dyspnea   . Sleep apnea    doesn't use C-pap  . Varicose veins     Past Surgical History:  Procedure Laterality Date  . CHOLECYSTECTOMY N/A 10/22/2014   Procedure: LAPAROSCOPIC CHOLECYSTECTOMY WITH INTRAOPERATIVE CHOLANGIOGRAM;  Surgeon: Dia Crawford III, MD;  Location: [facility] ORS;  Service: General;  Laterality: N/A;  . cyst removed      from lip as a child  . INTRAMEDULLARY (IM) NAIL INTERTROCHANTERIC N/A 02/18/2019   Procedure: INTRAMEDULLARY (IM) NAIL INTERTROCHANTRIC, RIGHT,;  Surgeon: Thornton Park, MD;  Location: [facility] ORS;  Service: Orthopedics;  Laterality: N/A;  . LUMBAR LAMINECTOMY/DECOMPRESSION MICRODISCECTOMY Bilateral  12/14/2012   Procedure: Bilateral lumbar three-four, four-five decompressive laminotomy/foraminotomy;  Surgeon: Charlie Pitter, MD;  Location: [place] NEURO ORS;  Service: Neurosurgery;  Laterality: Bilateral;  . PULSE GENERATOR IMPLANT Bilateral 12/13/2013   Procedure: Bilateral implantable pulse generator placement;  Surgeon: Erline Levine, MD;  Location: [place] NEURO ORS;  Service: Neurosurgery;  Laterality: Bilateral;  Bilateral implantable pulse generator placement  . skin cancer removed     from ears,   12 lft arm  rt leg 15  . SUBTHALAMIC STIMULATOR BATTERY REPLACEMENT Bilateral 07/14/2017   Procedure: BILATERAL IMPLANTED PULSE GENERATOR CHANGE FOR DEEP BRAIN STIMULATOR;  Surgeon: Erline Levine, MD;  Location: [place];  Service: Neurosurgery;  Laterality: Bilateral;  . SUBTHALAMIC STIMULATOR INSERTION Bilateral 12/06/2013   Procedure: SUBTHALAMIC STIMULATOR INSERTION;  Surgeon: Erline Levine, MD;  Location: [place] NEURO ORS;  Service: Neurosurgery;  Laterality: Bilateral;  Bilateral deep brain stimulator placement    There were no vitals filed for this visit.  Subjective Assessment - 09/23/19 1253    Subjective  Patient presents with his wife.  They report no falls since his last PT visit. Pt reports 6/10 pain in R quad/distal ITB region upon arrival.    Patient is accompained by:  Family member    Pertinent History  [name] is a 69  y.o. male with a hx of HTN, bradycardia, Parkinson's s/p deep brain stimulator 11/2013 , HLD, depression, possible seizure disorder, arthritis, depression/PTSD, dysrhythmia, GERD, sleep apnea. Patient was seen by this therapist in years past. Patient had a fall around Halloween of 2020 and admitted for ORIF on 02/18/19, was discharged to SNF and had [place] around Christmas. Had home health therapy for a month and a half. After that tried to go to Outpatient Therapy for 3 sessions at [facility] and was not pleased due to how busy and crowded the location was. Fell last week in  bathroom, didn't take walker in, falls about 2x month. Is very weak in RLE. Patient has a new lift chair and U drive walking. Has a caregiver 4 days a week.    Limitations  Walking;Standing;Sitting;House hold activities;Lifting    How long can you sit comfortably?  not limited with a backrest, without a back rest 3 minutes    How long can you stand comfortably?  3-5 minutes with holding on.    How long can you walk comfortably?  with U step 6 minutes.    Patient Stated Goals  pain reduction of R hip. strength of legs, dance with wife. push lawnmower.    Currently in Pain?  Yes    Pain Score  6     Pain Location  Leg    Pain Orientation  Right    Pain Descriptors / Indicators  Aching    Pain Type  Chronic pain    Pain Onset  1 to 4 weeks ago    Pain Frequency  Intermittent    Pain Relieving Factors  rest    Multiple Pain Sites  No    Pain Onset  More than a month ago    Pain Frequency  Intermittent         Treatment:   Therapeutic Ex:  R LE stretching- HS, quad, ITB, piriformis (all 4x30 sec) on blue plinth; and following with B 2.5# ankle wts and all 2x10 B; SAQ, alt hip marching in hooklying; LAQ, HS curls seated with RTB; stdg heel and toe raises; standing hip abd; standing hip extension.  NMR:  Standing with very narrow BOS on airex pad with UE's across torso 5 bouts; standing in // bars without UE support with alt toe tapping on edge of AirEx foam pad.                       PT Education - 09/23/19 1256    Education Details  HEP seated LAQ, HS curls, alt hip flexion to do with aide - hand outs given and RTB    Person(s) Educated  Patient;Spouse    Methods  Explanation;Demonstration;Verbal cues;Handout    Comprehension  Verbalized understanding;Returned demonstration;Verbal cues required;Tactile cues required       PT Short Term Goals - 08/22/19 1248      PT SHORT TERM GOAL #1   Title  Pt will perform HEP with family's/aide's supervision, for improved  balance, transfers, and gait.    Baseline  5/5: HEP given    Time  4    Period  Weeks    Status  New    Target Date  09/19/19        PT Long Term Goals - 08/22/19 1249      PT LONG TERM GOAL #1   Title  Patient (> 25 years old) will complete five times sit to stand test in < 15 seconds  without UE support without LOB indicating an increased LE strength and improved balance.    Baseline  5/5: 19 seconds one posterior LOB    Time  8    Period  Weeks    Status  New    Target Date  10/16/19      PT LONG TERM GOAL #2   Title  Patient will increase 10 meter walk test to >1.29m/s as to improve gait speed for better community ambulation and to reduce fall risk.    Baseline  4/4: 0.71 m/s with U step walker    Time  8    Period  Weeks    Status  New    Target Date  10/16/19      PT LONG TERM GOAL #3   Title  Patient will increase Berg Balance score by > 6 points (32/56) to demonstrate decreased fall risk during functional activities.    Baseline  5/5: 26/56    Time  8    Period  Weeks    Status  New    Target Date  10/16/19      PT LONG TERM GOAL #4   Title  Patient will increase FOTO score to equal to or greater than 31/100  to demonstrate statistically significant improvement in mobility and quality of life.    Baseline  5/5: 12/100    Time  8    Period  Weeks    Status  New    Target Date  10/16/19            Plan - 09/23/19 1257    Clinical Impression Statement  Excellent motivation during PT session.  Added some seated LE strengthening ex's for pt to do with aide at wife's request - hand outs given.    Personal Factors and Comorbidities  Age;Comorbidity 3+;Past/Current Experience;Time since onset of injury/illness/exacerbation;Transportation    Comorbidities  athritis, bradycardia, cancer, depression, dysrhythmia, GERD, HTN, Parkinson's, PTSD, SOB, sleep apnea    Examination-Activity Limitations  Bathing;Bed Mobility;Caring for Others;Bend;Dressing;Lift;Locomotion  Level;Reach Overhead;Sit;Squat;Stairs;Stand;Toileting;Transfers    Examination-Participation Restrictions  Church;Cleaning;Community Activity;Interpersonal Relationship;Shop;Volunteer;Yard Work;Other    Stability/Clinical Decision Making  Unstable/Unpredictable    Clinical Decision Making  High    Rehab Potential  Fair    PT Frequency  2x / week    PT Duration  8 weeks    PT Treatment/Interventions  ADLs/Self Care Home Management;Aquatic Therapy;Cryotherapy;Electrical Stimulation;Iontophoresis 4mg /ml Dexamethasone;Moist Heat;Ultrasound;Contrast Bath;DME Instruction;Gait training;Stair training;Functional mobility training;Therapeutic activities;Therapeutic exercise;Balance training;Neuromuscular re-education;Patient/family education;Manual techniques;Wheelchair mobility training;Energy conservation;Passive range of motion;Traction;Orthotic Fit/Training;Dry needling;Vestibular;Visual/perceptual remediation/compensation;Taping;Canalith Repostioning    PT Next Visit Plan  HEP, balance RLE strength, hip extension    PT Home Exercise Plan  [email or web address]    Consulted and Agree with Plan of Care  Patient;Family member/caregiver    Family Member Consulted  wife       Patient will benefit from skilled therapeutic intervention in order to improve the following deficits and impairments:  Abnormal gait, Decreased balance, Decreased coordination, Decreased mobility, Impaired tone, Postural dysfunction, Decreased strength, Decreased safety awareness, Improper body mechanics, Impaired flexibility, Decreased activity tolerance, Decreased endurance, Decreased knowledge of precautions, Difficulty walking, Pain, Cardiopulmonary status limiting activity, Decreased range of motion, Impaired perceived functional ability  Visit Diagnosis: Pain in right hip  Unsteadiness on feet  Muscle weakness (generalized)     Problem List Patient Active Problem List   Diagnosis Date Noted  . E. coli bacteremia 02/23/2019  .  Dysphagia 02/23/2019  . Aspiration pneumonia ([place]) 02/21/2019  . Closed  right hip fracture ([place]) 02/16/2019  . Rib pain on right side 09/25/2018  . Skin sore 12/01/2017  . Hematoma 10/18/2017  . Fall at home 10/18/2017  . 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 Radicular pain in right arm 10/09/2016  . GERD (gastroesophageal reflux disease) 07/31/2016  . Trochanteric bursitis 03/03/2016  . Left hand pain 10/30/2015  . Fracture, finger, multiple sites 10/30/2015  . Colon cancer screening 07/23/2015  . Healthcare maintenance 07/23/2015  . Gout 02/18/2015  . Depression 01/06/2015  . Irritation of eyelid 08/20/2014  . Dupuytren's contracture 08/20/2014  . Cough 07/21/2014  . Lumbar stenosis with neurogenic claudication 06/13/2014  . Preop cardiovascular exam 05/28/2014  . S/P deep brain stimulator placement 05/08/2014  . Joint pain 01/22/2014  . Medicare annual wellness visit, initial 01/19/2014  . Advance care planning 01/19/2014  . Parkinson's disease ([place]) 12/13/2013  . PTSD (post-traumatic stress disorder) 06/13/2013  . Erectile dysfunction 06/13/2013  . HLD (hyperlipidemia) 06/13/2013  . Hip pain 06/10/2013  . Pain in joint, shoulder region 06/10/2013  . Right leg swelling 06/03/2013  . Essential hypertension 06/03/2013  . Bradycardia by electrocardiogram 06/03/2013  . Obstructive sleep apnea 03/12/2013  . Spinal stenosis, lumbar region, with neurogenic claudication 12/14/2012    Nafeesa Dils, MPT 09/23/2019, 1:00 PM  [place] MAIN [facility] SERVICES [address] [place], [place], [address] Phone: [phone]   Fax:  [phone]  Name: [name] MRN: [id number] Date of Birth: [birth year]

## 2019-09-25 ENCOUNTER — Other Ambulatory Visit: Payer: Self-pay

## 2019-09-25 ENCOUNTER — Ambulatory Visit: Payer: Medicare PPO

## 2019-09-25 DIAGNOSIS — M25551 Pain in right hip: Secondary | ICD-10-CM | POA: Diagnosis not present

## 2019-09-25 DIAGNOSIS — M6281 Muscle weakness (generalized): Secondary | ICD-10-CM

## 2019-09-25 DIAGNOSIS — R2681 Unsteadiness on feet: Secondary | ICD-10-CM

## 2019-09-25 DIAGNOSIS — G20 Parkinson's disease: Secondary | ICD-10-CM | POA: Diagnosis not present

## 2019-09-25 DIAGNOSIS — R2689 Other abnormalities of gait and mobility: Secondary | ICD-10-CM

## 2019-09-25 NOTE — Therapy (Signed)
[place] MAIN [facility] SERVICES [address] [place], [place], [address] Phone: [phone]   Fax:  [phone]  Physical Therapy Treatment  Patient Details  Name: [name] MRN: [id number] Date of Birth: 04/29/1950 Referring Provider (PT): Sharolyn Douglas MD    Encounter Date: 09/25/2019  PT End of Session - 09/25/19 1254    Visit Number  8    Number of Visits  16    Date for PT Re-Evaluation  10/16/19    Authorization Type  8/10; eval 5/5/ 21    PT Start Time  1049    PT Stop Time  1132    PT Time Calculation (min)  43 min    Equipment Utilized During Treatment  Gait belt    Activity Tolerance  Patient tolerated treatment well;Patient limited by pain    Behavior During Therapy  WFL for tasks assessed/performed       Past Medical History:  Diagnosis Date  . Arthritis   . Bradycardia   . Cancer [facility]) 2013   skin cancer  . Depression    ptsd  . Dysrhythmia    chronic slow heart rate  . GERD (gastroesophageal reflux disease)   . Headache(784.0)    tension headaches non recent  . History of chicken pox   . History of kidney stones    passed  . Hypertension    treated with HCTZ  . Parkinson's disease ([place])    dx'ed 15 years ago  . PTSD (post-traumatic stress disorder)   . Shortness of breath dyspnea   . Sleep apnea    doesn't use C-pap  . Varicose veins     Past Surgical History:  Procedure Laterality Date  . CHOLECYSTECTOMY N/A 10/22/2014   Procedure: LAPAROSCOPIC CHOLECYSTECTOMY WITH INTRAOPERATIVE CHOLANGIOGRAM;  Surgeon: Dia Crawford III, MD;  Location: [facility] ORS;  Service: General;  Laterality: N/A;  . cyst removed      from lip as a child  . INTRAMEDULLARY (IM) NAIL INTERTROCHANTERIC N/A 02/18/2019   Procedure: INTRAMEDULLARY (IM) NAIL INTERTROCHANTRIC, RIGHT,;  Surgeon: Thornton Park, MD;  Location: [facility] ORS;  Service: Orthopedics;  Laterality: N/A;  . LUMBAR LAMINECTOMY/DECOMPRESSION MICRODISCECTOMY Bilateral  12/14/2012   Procedure: Bilateral lumbar three-four, four-five decompressive laminotomy/foraminotomy;  Surgeon: Charlie Pitter, MD;  Location: [place] NEURO ORS;  Service: Neurosurgery;  Laterality: Bilateral;  . PULSE GENERATOR IMPLANT Bilateral 12/13/2013   Procedure: Bilateral implantable pulse generator placement;  Surgeon: Erline Levine, MD;  Location: [place] NEURO ORS;  Service: Neurosurgery;  Laterality: Bilateral;  Bilateral implantable pulse generator placement  . skin cancer removed     from ears,   12 lft arm  rt leg 15  . SUBTHALAMIC STIMULATOR BATTERY REPLACEMENT Bilateral 07/14/2017   Procedure: BILATERAL IMPLANTED PULSE GENERATOR CHANGE FOR DEEP BRAIN STIMULATOR;  Surgeon: Erline Levine, MD;  Location: [place];  Service: Neurosurgery;  Laterality: Bilateral;  . SUBTHALAMIC STIMULATOR INSERTION Bilateral 12/06/2013   Procedure: SUBTHALAMIC STIMULATOR INSERTION;  Surgeon: Erline Levine, MD;  Location: [place] NEURO ORS;  Service: Neurosurgery;  Laterality: Bilateral;  Bilateral deep brain stimulator placement    There were no vitals filed for this visit.  Subjective Assessment - 09/25/19 1252    Subjective  Patient presents with aide, no falls or LOB since last session. Wants to be able to dance with his wife again.    Patient is accompained by:  Family member    Pertinent History  [name] is a 69 y.o. male with a hx  of HTN, bradycardia, Parkinson's s/p deep brain stimulator 11/2013 , HLD, depression, possible seizure disorder, arthritis, depression/PTSD, dysrhythmia, GERD, sleep apnea. Patient was seen by this therapist in years past. Patient had a fall around Halloween of 2020 and admitted for ORIF on 02/18/19, was discharged to SNF and had [place] around Christmas. Had home health therapy for a month and a half. After that tried to go to Outpatient Therapy for 3 sessions at [facility] and was not pleased due to how busy and crowded the location was. Fell last week in bathroom, didn't take walker in,  falls about 2x month. Is very weak in RLE. Patient has a new lift chair and U drive walking. Has a caregiver 4 days a week.    Limitations  Walking;Standing;Sitting;House hold activities;Lifting    How long can you sit comfortably?  not limited with a backrest, without a back rest 3 minutes    How long can you stand comfortably?  3-5 minutes with holding on.    How long can you walk comfortably?  with U step 6 minutes.    Patient Stated Goals  pain reduction of R hip. strength of legs, dance with wife. push lawnmower.    Currently in Pain?  Yes    Pain Score  2     Pain Location  Leg    Pain Orientation  Right    Pain Descriptors / Indicators  Aching    Pain Type  Chronic pain    Pain Onset  1 to 4 weeks ago    Pain Frequency  Intermittent            Treatment: Supine: educated aide on performing at home   Roller to R quad for reduction of muscle tissue dysfunction for pain relief x 4 minutes   Prone:  Supine <>Prone x 2 person assist Prone hip flexor stretch 2x 30 seconds    Standing in // bars: cues for sequencing, upright posture, close CGA:  Stand at first line, large step to second line with UE support, clap, return to UE support and step back to starting position x 10 each side, initially challenging to maintain stability with clap but improved with repetition as patient improved upright posture   airex pad: one foot on airex pad one foot on opp airex pad 30 second holds x 2 trials each LE ; SUE support. Min A to R pelvis  airex pad: 2000 gr weighted ball chest press 15x  airex pad: 2000 gr straight arm raise 15x; very challenging  airex balance beam: side stepping 4x length of // bars. UE support  2x4 between feet: forward ambulate and then backwards with decreasing UE support from BUE to SUE to no UE support. X 14 lengths   Side stepping, backwards stepping, lateral diagonal stepping for carryover to slow dance biomechanics in // bars. Once demonstrate safe mobility  performed outside of // bars with aide and PT performing CGA.      Ambulate with walker , 40 ft x 2 trials, cues for upright posture and equal step length, close CGA   Sit to stand reach 5x without UE support      Pt educated throughout session about proper posture and technique with exercises. Improved exercise technique, movement at target joints, use of target muscles after min to mod verbal, visual, tactile cues.                PT Education - 09/25/19 1253    Education provided  Yes    Education Details  exercise technique, body mechanics    Person(s) Educated  Patient    Methods  Explanation;Demonstration;Tactile cues;Verbal cues    Comprehension  Verbalized understanding;Returned demonstration;Verbal cues required;Tactile cues required       PT Short Term Goals - 08/22/19 1248      PT SHORT TERM GOAL #1   Title  Pt will perform HEP with family's/aide's supervision, for improved balance, transfers, and gait.    Baseline  5/5: HEP given    Time  4    Period  Weeks    Status  New    Target Date  09/19/19        PT Long Term Goals - 08/22/19 1249      PT LONG TERM GOAL #1   Title  Patient (> 87 years old) will complete five times sit to stand test in < 15 seconds without UE support without LOB indicating an increased LE strength and improved balance.    Baseline  5/5: 19 seconds one posterior LOB    Time  8    Period  Weeks    Status  New    Target Date  10/16/19      PT LONG TERM GOAL #2   Title  Patient will increase 10 meter walk test to >1.79m/s as to improve gait speed for better community ambulation and to reduce fall risk.    Baseline  4/4: 0.71 m/s with U step walker    Time  8    Period  Weeks    Status  New    Target Date  10/16/19      PT LONG TERM GOAL #3   Title  Patient will increase Berg Balance score by > 6 points (32/56) to demonstrate decreased fall risk during functional activities.    Baseline  5/5: 26/56    Time  8    Period   Weeks    Status  New    Target Date  10/16/19      PT LONG TERM GOAL #4   Title  Patient will increase FOTO score to equal to or greater than 31/100  to demonstrate statistically significant improvement in mobility and quality of life.    Baseline  5/5: 12/100    Time  8    Period  Weeks    Status  New    Target Date  10/16/19            Plan - 09/25/19 1255    Clinical Impression Statement  Patient presents with excellent motivation, is accompanied by aide. Progression of stability with decreasing UE support performed. Patient is challenged by unstable surfaces and occasional discomfort/pain in L R distal quad that improves with stretching. Patient would benefit from skilled physical therapy to increase strength, stability, balance, and decrease falls risk to improve quality of life    Personal Factors and Comorbidities  Age;Comorbidity 3+;Past/Current Experience;Time since onset of injury/illness/exacerbation;Transportation    Comorbidities  athritis, bradycardia, cancer, depression, dysrhythmia, GERD, HTN, Parkinson's, PTSD, SOB, sleep apnea    Examination-Activity Limitations  Bathing;Bed Mobility;Caring for Others;Bend;Dressing;Lift;Locomotion Level;Reach Overhead;Sit;Squat;Stairs;Stand;Toileting;Transfers    Examination-Participation Restrictions  Church;Cleaning;Community Activity;Interpersonal Relationship;Shop;Volunteer;Yard Work;Other    Stability/Clinical Decision Making  Unstable/Unpredictable    Rehab Potential  Fair    PT Frequency  2x / week    PT Duration  8 weeks    PT Treatment/Interventions  ADLs/Self Care Home Management;Aquatic Therapy;Cryotherapy;Electrical Stimulation;Iontophoresis 4mg /ml Dexamethasone;Moist Heat;Ultrasound;Contrast Bath;DME Instruction;Gait training;Stair training;Functional mobility training;Therapeutic  activities;Therapeutic exercise;Balance training;Neuromuscular re-education;Patient/family education;Manual techniques;Wheelchair mobility  training;Energy conservation;Passive range of motion;Traction;Orthotic Fit/Training;Dry needling;Vestibular;Visual/perceptual remediation/compensation;Taping;Canalith Repostioning    PT Next Visit Plan  HEP, balance RLE strength, hip extension    PT Home Exercise Plan  [email or web address]    Consulted and Agree with Plan of Care  Patient;Family member/caregiver    Family Member Consulted  wife       Patient will benefit from skilled therapeutic intervention in order to improve the following deficits and impairments:  Abnormal gait, Decreased balance, Decreased coordination, Decreased mobility, Impaired tone, Postural dysfunction, Decreased strength, Decreased safety awareness, Improper body mechanics, Impaired flexibility, Decreased activity tolerance, Decreased endurance, Decreased knowledge of precautions, Difficulty walking, Pain, Cardiopulmonary status limiting activity, Decreased range of motion, Impaired perceived functional ability  Visit Diagnosis: Pain in right hip  Unsteadiness on feet  Muscle weakness (generalized)  Other abnormalities of gait and mobility  Parkinson's disease ([place])     Problem List Patient Active Problem List   Diagnosis Date Noted  . E. coli bacteremia 02/23/2019  . Dysphagia 02/23/2019  . Aspiration pneumonia ([place]) 02/21/2019  . Closed right hip fracture ([place]) 02/16/2019  . Rib pain on right side 09/25/2018  . Skin sore 12/01/2017  . Hematoma 10/18/2017  . Fall at home 10/18/2017  . 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 Radicular pain in right arm 10/09/2016  . GERD (gastroesophageal reflux disease) 07/31/2016  . Trochanteric bursitis 03/03/2016  . Left hand pain 10/30/2015  . Fracture, finger, multiple sites 10/30/2015  . Colon cancer screening 07/23/2015  . Healthcare maintenance 07/23/2015  . Gout 02/18/2015  . Depression 01/06/2015  . Irritation of eyelid 08/20/2014  . Dupuytren's contracture 08/20/2014  . Cough 07/21/2014  . Lumbar stenosis  with neurogenic claudication 06/13/2014  . Preop cardiovascular exam 05/28/2014  . S/P deep brain stimulator placement 05/08/2014  . Joint pain 01/22/2014  . Medicare annual wellness visit, initial 01/19/2014  . Advance care planning 01/19/2014  . Parkinson's disease ([place]) 12/13/2013  . PTSD (post-traumatic stress disorder) 06/13/2013  . Erectile dysfunction 06/13/2013  . HLD (hyperlipidemia) 06/13/2013  . Hip pain 06/10/2013  . Pain in joint, shoulder region 06/10/2013  . Right leg swelling 06/03/2013  . Essential hypertension 06/03/2013  . Bradycardia by electrocardiogram 06/03/2013  . Obstructive sleep apnea 03/12/2013  . Spinal stenosis, lumbar region, with neurogenic claudication 12/14/2012   Janna Arch, PT, DPT   09/25/2019, 12:56 PM  [place] MAIN [facility] SERVICES [address] [place], [place], [address] Phone: [phone]   Fax:  [phone]  Name: [name] MRN: [id number] Date of Birth: May 09, 1950

## 2019-09-30 ENCOUNTER — Other Ambulatory Visit: Payer: Self-pay

## 2019-09-30 ENCOUNTER — Ambulatory Visit: Payer: Medicare PPO

## 2019-09-30 DIAGNOSIS — M6281 Muscle weakness (generalized): Secondary | ICD-10-CM | POA: Diagnosis not present

## 2019-09-30 DIAGNOSIS — R2681 Unsteadiness on feet: Secondary | ICD-10-CM | POA: Diagnosis not present

## 2019-09-30 DIAGNOSIS — M25551 Pain in right hip: Secondary | ICD-10-CM

## 2019-09-30 DIAGNOSIS — R2689 Other abnormalities of gait and mobility: Secondary | ICD-10-CM

## 2019-09-30 DIAGNOSIS — G20 Parkinson's disease: Secondary | ICD-10-CM | POA: Diagnosis not present

## 2019-09-30 NOTE — Therapy (Signed)
[place] MAIN [facility] SERVICES [address] [place], [place], [address] Phone: [phone]   Fax:  [phone]  Physical Therapy Treatment  Patient Details  Name: [name] MRN: [id number] Date of Birth: Aug 27, 1950 Referring Provider (PT): George Douglas MD    Encounter Date: 09/30/2019   PT End of Session - 09/30/19 1004    Visit Number 9    Number of Visits 16    Date for PT Re-Evaluation 10/16/19    Authorization Type 9/10; eval 5/5/ 21    PT Start Time 1014    PT Stop Time 1100    PT Time Calculation (min) 46 min    Equipment Utilized During Treatment Gait belt    Activity Tolerance Patient tolerated treatment well;Patient limited by pain    Behavior During Therapy WFL for tasks assessed/performed           Past Medical History:  Diagnosis Date  . Arthritis   . Bradycardia   . Cancer [facility]) 2013   skin cancer  . Depression    ptsd  . Dysrhythmia    chronic slow heart rate  . GERD (gastroesophageal reflux disease)   . Headache(784.0)    tension headaches non recent  . History of chicken pox   . History of kidney stones    passed  . Hypertension    treated with HCTZ  . Parkinson's disease ([place])    dx'ed 15 years ago  . PTSD (post-traumatic stress disorder)   . Shortness of breath dyspnea   . Sleep apnea    doesn't use C-pap  . Varicose veins     Past Surgical History:  Procedure Laterality Date  . CHOLECYSTECTOMY N/A 10/22/2014   Procedure: LAPAROSCOPIC CHOLECYSTECTOMY WITH INTRAOPERATIVE CHOLANGIOGRAM;  Surgeon: Dia Crawford III, MD;  Location: [facility] ORS;  Service: General;  Laterality: N/A;  . cyst removed      from lip as a child  . INTRAMEDULLARY (IM) NAIL INTERTROCHANTERIC N/A 02/18/2019   Procedure: INTRAMEDULLARY (IM) NAIL INTERTROCHANTRIC, RIGHT,;  Surgeon: Thornton Park, MD;  Location: [facility] ORS;  Service: Orthopedics;  Laterality: N/A;  . LUMBAR LAMINECTOMY/DECOMPRESSION MICRODISCECTOMY Bilateral  12/14/2012   Procedure: Bilateral lumbar three-four, four-five decompressive laminotomy/foraminotomy;  Surgeon: Charlie Pitter, MD;  Location: [place] NEURO ORS;  Service: Neurosurgery;  Laterality: Bilateral;  . PULSE GENERATOR IMPLANT Bilateral 12/13/2013   Procedure: Bilateral implantable pulse generator placement;  Surgeon: Erline Levine, MD;  Location: [place] NEURO ORS;  Service: Neurosurgery;  Laterality: Bilateral;  Bilateral implantable pulse generator placement  . skin cancer removed     from ears,   12 lft arm  rt leg 15  . SUBTHALAMIC STIMULATOR BATTERY REPLACEMENT Bilateral 07/14/2017   Procedure: BILATERAL IMPLANTED PULSE GENERATOR CHANGE FOR DEEP BRAIN STIMULATOR;  Surgeon: Erline Levine, MD;  Location: [place];  Service: Neurosurgery;  Laterality: Bilateral;  . SUBTHALAMIC STIMULATOR INSERTION Bilateral 12/06/2013   Procedure: SUBTHALAMIC STIMULATOR INSERTION;  Surgeon: Erline Levine, MD;  Location: [place] [facility]URO ORS;  Service: Neurosurgery;  Laterality: Bilateral;  Bilateral deep brain stimulator placement    There were no vitals filed for this visit.   Subjective Assessment - 09/30/19 1003    Subjective Patient presents with his daughter since his wife is still sick. Reports no falls since last session.    Patient is accompained by: Family member    Pertinent History [name] is a 69 y.o. male with a hx of HTN, bradycardia, Parkinson's s/p deep brain stimulator 11/2013 , HLD,  depression, possible seizure disorder, arthritis, depression/PTSD, dysrhythmia, GERD, sleep apnea. Patient was seen by this therapist in years past. Patient had a fall around Halloween of 2020 and admitted for ORIF on 02/18/19, was discharged to SNF and had [place] around Christmas. Had home health therapy for a month and a half. After that tried to go to Outpatient Therapy for 3 sessions at [facility] and was not pleased due to how busy and crowded the location was. Fell last week in bathroom, didn't take walker in, falls about  2x month. Is very weak in RLE. Patient has a new lift chair and U drive walking. Has a caregiver 4 days a week.    Limitations Walking;Standing;Sitting;House hold activities;Lifting    How long can you sit comfortably? not limited with a backrest, without a back rest 3 minutes    How long can you stand comfortably? 3-5 minutes with holding on.    How long can you walk comfortably? with U step 6 minutes.    Patient Stated Goals pain reduction of R hip. strength of legs, dance with wife. push lawnmower.    Currently in Pain? No/denies    Pain Onset --                  Treatment: Supine: educated aide on performing at home   Roller to R quad for reduction of muscle tissue dysfunction for pain relief x 4 minutes  GTB abduction with focus on RLE activation 15x  hooklying adduction into green ball 15x     Standing in // bars: cues for sequencing, upright posture, close CGA:    airex pad: one foot on airex pad one foot on opp airex pad 30 second holds x 2 trials each LE ; SUE support. Min A to R pelvis  airex pad; toe taps 6" step 10x each LE SUE support  airex pad horizontal head turns 10x no LOB;    2x4 between feet: forward ambulate and then backwards with decreasing UE support from BUE to SUE to no UE support. X 14 lengths    Side stepping, backwards stepping, lateral diagonal stepping for carryover to slow dance biomechanics in // bars.  Standing with # 4 lb  ankle weight: CGA for stability  -Hip extension with bilateral upper extremity support, cueing for neutral hip alignment, upright posture for optimal muscle recruitment, and sequencing, 10x each LE,  -Hip abduction with bilateral upper extremity support, cueing for neutral foot alignment for correct muscle activation, 10x each LE -Hip flexion with bilateral  upper extremity support, cueing for body mechanics, speed of muscle recruitment for optimal strengthening and stabilization 10x each LE -Hamstring curl with  bilateral upper extremity support, cueing for knee alignment for recruitment of hamstring musculature, 10x each LE (slight pain In RLE)   Seated with # 4 lb ankle weights  -Seated marches with upright posture, back away from back of chair for abdominal/trunk activation/stabilization, 10x each LE -Seated LAQ with 3 second holds, 10x each LE, cueing for muscle activation and sequencing for neutral alignment -Seated IR/ER with cueing for stabilizing knee placement with lateral foot movement for optimal muscle recruitment, 10x each LE     Ambulate with walker , 40 ft x 2 trials, cues for upright posture and equal step length, close CGA   Sit to stand reach 5x without UE support      Pt educated throughout session about proper posture and technique with exercises. Improved exercise technique, movement at target joints, use of  target muscles after min to mod verbal, visual, tactile cues.                    PT Education - 09/30/19 1003    Education provided Yes    Education Details exercise technique, body mechanics    Person(s) Educated Patient    Methods Explanation;Demonstration;Tactile cues;Verbal cues    Comprehension Verbalized understanding;Returned demonstration;Verbal cues required;Tactile cues required            PT Short Term Goals - 08/22/19 1248      PT SHORT TERM GOAL #1   Title Pt will perform HEP with family's/aide's supervision, for improved balance, transfers, and gait.    Baseline 5/5: HEP given    Time 4    Period Weeks    Status New    Target Date 09/19/19             PT Long Term Goals - 08/22/19 1249      PT LONG TERM GOAL #1   Title Patient (> 34 years old) will complete five times sit to stand test in < 15 seconds without UE support without LOB indicating an increased LE strength and improved balance.    Baseline 5/5: 19 seconds one posterior LOB    Time 8    Period Weeks    Status New    Target Date 10/16/19      PT LONG TERM GOAL  #2   Title Patient will increase 10 meter walk test to >1.34m/s as to improve gait speed for better community ambulation and to reduce fall risk.    Baseline 4/4: 0.71 m/s with U step walker    Time 8    Period Weeks    Status New    Target Date 10/16/19      PT LONG TERM GOAL #3   Title Patient will increase Berg Balance score by > 6 points (32/56) to demonstrate decreased fall risk during functional activities.    Baseline 5/5: 26/56    Time 8    Period Weeks    Status New    Target Date 10/16/19      PT LONG TERM GOAL #4   Title Patient will increase FOTO score to equal to or greater than 31/100  to demonstrate statistically significant improvement in mobility and quality of life.    Baseline 5/5: 12/100    Time 8    Period Weeks    Status New    Target Date 10/16/19                 Plan - 09/30/19 1055    Clinical Impression Statement Patient presents with excellent motivation despite fatigue. Focus on strengthening and stability performed with patient tolerating progression well.  Occasional quad pain limits standing stability. Unstable surfaces continue to be an area for improvement. Patient would benefit from skilled physical therapy to increase strength, stability, balance, and decrease falls risk to improve quality of life    Personal Factors and Comorbidities Age;Comorbidity 3+;Past/Current Experience;Time since onset of injury/illness/exacerbation;Transportation    Comorbidities athritis, bradycardia, cancer, depression, dysrhythmia, GERD, HTN, Parkinson's, PTSD, SOB, sleep apnea    Examination-Activity Limitations Bathing;Bed Mobility;Caring for Others;Bend;Dressing;Lift;Locomotion Level;Reach Overhead;Sit;Squat;Stairs;Stand;Toileting;Transfers    Examination-Participation Restrictions Church;Cleaning;Community Activity;Interpersonal Relationship;Shop;Volunteer;Yard Work;Other    Stability/Clinical Decision Making Unstable/Unpredictable    Rehab Potential Fair     PT Frequency 2x / week    PT Duration 8 weeks    PT Treatment/Interventions ADLs/Self Care Home Management;Aquatic Therapy;Cryotherapy;Dealer  Stimulation;Iontophoresis 4mg /ml Dexamethasone;Moist Heat;Ultrasound;Contrast Bath;DME Instruction;Gait training;Stair training;Functional mobility training;Therapeutic activities;Therapeutic exercise;Balance training;Neuromuscular re-education;Patient/family education;Manual techniques;Wheelchair mobility training;Energy conservation;Passive range of motion;Traction;Orthotic Fit/Training;Dry needling;Vestibular;Visual/perceptual remediation/compensation;Taping;Canalith Repostioning    PT Next Visit Plan HEP, balance RLE strength, hip extension    PT Home Exercise Plan [email or web address]    Consulted and Agree with Plan of Care Patient;Family member/caregiver    Family Member Consulted wife           Patient will benefit from skilled therapeutic intervention in order to improve the following deficits and impairments:  Abnormal gait, Decreased balance, Decreased coordination, Decreased mobility, Impaired tone, Postural dysfunction, Decreased strength, Decreased safety awareness, Improper body mechanics, Impaired flexibility, Decreased activity tolerance, Decreased endurance, Decreased knowledge of precautions, Difficulty walking, Pain, Cardiopulmonary status limiting activity, Decreased range of motion, Impaired perceived functional ability  Visit Diagnosis: Pain in right hip  Unsteadiness on feet  Muscle weakness (generalized)  Other abnormalities of gait and mobility     Problem List Patient Active Problem List   Diagnosis Date Noted  . E. coli bacteremia 02/23/2019  . Dysphagia 02/23/2019  . Aspiration pneumonia ([place]) 02/21/2019  . Closed right hip fracture ([place]) 02/16/2019  . Rib pain on right side 09/25/2018  . Skin sore 12/01/2017  . Hematoma 10/18/2017  . Fall at home 10/18/2017  . 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 Radicular pain in  right arm 10/09/2016  . GERD (gastroesophageal reflux disease) 07/31/2016  . Trochanteric bursitis 03/03/2016  . Left hand pain 10/30/2015  . Fracture, finger, multiple sites 10/30/2015  . Colon cancer screening 07/23/2015  . Healthcare maintenance 07/23/2015  . Gout 02/18/2015  . Depression 01/06/2015  . Irritation of eyelid 08/20/2014  . Dupuytren's contracture 08/20/2014  . Cough 07/21/2014  . Lumbar stenosis with neurogenic claudication 06/13/2014  . Preop cardiovascular exam 05/28/2014  . S/P deep brain stimulator placement 05/08/2014  . Joint pain 01/22/2014  . Medicare annual wellness visit, initial 01/19/2014  . Advance care planning 01/19/2014  . Parkinson's disease ([place]) 12/13/2013  . PTSD (post-traumatic stress disorder) 06/13/2013  . Erectile dysfunction 06/13/2013  . HLD (hyperlipidemia) 06/13/2013  . Hip pain 06/10/2013  . Pain in joint, shoulder region 06/10/2013  . Right leg swelling 06/03/2013  . Essential hypertension 06/03/2013  . Bradycardia by electrocardiogram 06/03/2013  . Obstructive sleep apnea 03/12/2013  . Spinal stenosis, lumbar region, with neurogenic claudication 12/14/2012   Janna Arch, PT, DPT   09/30/2019, 11:02 AM  [place] MAIN [facility] SERVICES [address] [place], [place], [address] Phone: [phone]   Fax:  [phone]  Name: [name] MRN: [id number] Date of Birth: 09/04/1950

## 2019-10-02 ENCOUNTER — Ambulatory Visit: Payer: Medicare PPO

## 2019-10-02 ENCOUNTER — Other Ambulatory Visit: Payer: Self-pay

## 2019-10-02 ENCOUNTER — Emergency Department
Admission: EM | Admit: 2019-10-02 | Discharge: 2019-10-02 | Disposition: A | Discharge status: Home / Self Care | Payer: Medicare PPO | Attending: Emergency Medicine | Admitting: Emergency Medicine

## 2019-10-02 ENCOUNTER — Emergency Department: Payer: Medicare PPO

## 2019-10-02 ENCOUNTER — Telehealth: Payer: Self-pay | Admitting: *Deleted

## 2019-10-02 DIAGNOSIS — R519 Headache, unspecified: Secondary | ICD-10-CM | POA: Diagnosis not present

## 2019-10-02 DIAGNOSIS — R2681 Unsteadiness on feet: Secondary | ICD-10-CM

## 2019-10-02 DIAGNOSIS — M25551 Pain in right hip: Secondary | ICD-10-CM

## 2019-10-02 DIAGNOSIS — R42 Dizziness and giddiness: Secondary | ICD-10-CM | POA: Diagnosis not present

## 2019-10-02 DIAGNOSIS — Z79899 Other long term (current) drug therapy: Secondary | ICD-10-CM | POA: Diagnosis not present

## 2019-10-02 DIAGNOSIS — M6281 Muscle weakness (generalized): Secondary | ICD-10-CM

## 2019-10-02 DIAGNOSIS — G20 Parkinson's disease: Secondary | ICD-10-CM | POA: Insufficient documentation

## 2019-10-02 DIAGNOSIS — I10 Essential (primary) hypertension: Secondary | ICD-10-CM | POA: Diagnosis not present

## 2019-10-02 DIAGNOSIS — R2689 Other abnormalities of gait and mobility: Secondary | ICD-10-CM

## 2019-10-02 DIAGNOSIS — Z85828 Personal history of other malignant neoplasm of skin: Secondary | ICD-10-CM | POA: Insufficient documentation

## 2019-10-02 LAB — URINALYSIS, COMPLETE (UACMP) WITH MICROSCOPIC
Bilirubin Urine: NEGATIVE
Glucose, UA: NEGATIVE mg/dL
Hgb urine dipstick: NEGATIVE
Ketones, ur: NEGATIVE mg/dL
Nitrite: NEGATIVE
Protein, ur: NEGATIVE mg/dL
Specific Gravity, Urine: 1.015 (ref 1.005–1.030)
pH: 5 (ref 5.0–8.0)

## 2019-10-02 LAB — BASIC METABOLIC PANEL
Anion gap: 9 (ref 5–15)
BUN: 19 mg/dL (ref 8–23)
CO2: 27 mmol/L (ref 22–32)
Calcium: 9.4 mg/dL (ref 8.9–10.3)
Chloride: 104 mmol/L (ref 98–111)
Creatinine, Ser: 0.85 mg/dL (ref 0.61–1.24)
GFR calc Af Amer: >60 mL/min (ref >60)
GFR calc non Af Amer: >60 mL/min (ref >60)
Glucose, Bld: 159 mg/dL — ABNORMAL HIGH (ref 70–99)
Potassium: 4.4 mmol/L (ref 3.5–5.1)
Sodium: 140 mmol/L (ref 135–145)

## 2019-10-02 LAB — CBC
HCT: 45.1 % (ref 39.0–52.0)
Hemoglobin: 15.3 g/dL (ref 13.0–17.0)
MCH: 30.6 pg (ref 26.0–34.0)
MCHC: 33.9 g/dL (ref 30.0–36.0)
MCV: 90.2 fL (ref 80.0–100.0)
Platelets: 197 10*3/uL (ref 150–400)
RBC: 5 MIL/uL (ref 4.22–5.81)
RDW: 13.1 % (ref 11.5–15.5)
WBC: 7.3 10*3/uL (ref 4.0–10.5)
nRBC: 0 % (ref 0.0–0.2)

## 2019-10-02 MED ORDER — SODIUM CHLORIDE 0.9% FLUSH: 3.0000 mL | Freq: Once | INTRAVENOUS | Status: DC

## 2019-10-02 NOTE — ED Notes (Signed)
Pt updated in WR, VS reassessed °

## 2019-10-02 NOTE — Telephone Encounter (Signed)
[place] [facility] - Client TELEPHONE ADVICE RECORD [facility] Patient Name: [name] Gender: Male DOB: June 30, 1950 Age: 69 Y 67 M 30 D Return Phone Number: [phone] (Primary), [phone] (Secondary) Address: City/State/Zip: [place] [address] Client [place] [facility] - Client Client Site [place] Physician Renford Dills - MD Contact Type Call Who Is Calling Patient / Member / Family / Caregiver Call Type Triage / Clinical Caller Name [name] Relationship To Patient Spouse Return Phone Number [phone] (Primary) Chief Complaint Blood Pressure High Reason for Call Symptomatic / Request for [place] states her husband has been experiencing high blood pressure and blurry vision. Caller states during his appointment his BP readings were 190/52, 182/59, 193/73 and then 151/80. Caller states he was sent home due to the high BP. [place] ED Translation No Nurse Assessment Nurse: Ronnald Ramp, RN, Miranda Date/Time (Eastern Time): 10/02/2019 11:58:11 AM Confirm and document reason for call. If symptomatic, describe symptoms. ---Caller states her husband fell twice yesterday. He was seen at PT today and when they checked his BP it was too high to have PT (they had 1 random BP of 190/152). He is c/o blurry vision and seems disoriented. Most recent BP 179/75 (normally 120/70). Has the patient had close contact with a person known or suspected to have the novel coronavirus illness OR traveled / lives in area with major community spread (including international travel) in the last 14 days from the onset of symptoms? * If Asymptomatic, screen for exposure and travel within the last 14 days. ---No Does the patient have any new or worsening symptoms? ---Yes Will a triage be completed? ---Yes Related visit to physician within the last 2 weeks?  ---No Does the PT have any chronic conditions? (i.e. diabetes, asthma, this includes High risk factors for pregnancy, etc.) ---Yes List chronic conditions. ---Parkinson's, Depression, GERD, Cognitive Is this a behavioral health or substance abuse call? ---No PLEASE NOTE: All timestamps contained within this report are represented as [place] Standard Time. CONFIDENTIALTY NOTICE: This fax transmission is intended only for the addressee. It contains information that is legally privileged, confidential or otherwise protected from use or disclosure. If you are not the intended recipient, you are strictly prohibited from reviewing, disclosing, copying using or disseminating any of this information or taking any action in reliance on or regarding this information. If you have received this fax in error, please notify us immediately by telephone so that we can arrange for its return to [place]. Phone: [phone], Toll-Free: [phone], Fax: [phone] Page: 2 of 2 Call Id: [id number] Guidelines Guideline Title Affirmed Question Affirmed Notes Nurse Date/Time Eilene Ghazi Time) Blood Pressure - High [9] Systolic BP >= 470 OR Diastolic >= 962 AND [8] cardiac or neurologic symptoms (e.g., chest pain, difficulty breathing, unsteady gait, blurred vision) Ronnald Ramp, RN, Miranda 10/02/2019 12:04:04 PM Disp. Time Eilene Ghazi Time) Disposition Final User 10/02/2019 11:47:34 AM Attempt made - message left Ronnald Ramp RN, Miranda 10/02/2019 11:48:24 AM Attempt made - no message left Burney Gauze, Miranda 10/02/2019 12:05:47 PM Go to ED Now Yes Ronnald Ramp, RN, Miranda Caller Disagree/Comply Comply Caller Understands Yes PreDisposition Go to ED Care Advice Given Per Guideline GO TO ED NOW: * You need to be seen in the Emergency Department. * Go to the ED at ___________ [place] now. Drive carefully. CARE ADVICE given per High Blood Pressure (Adult) guideline. CALL EMS 911 IF: * Patient passes out, starts  acting confused  or becomes too weak to stand. * You become worse. Comments User: Leverne Humbles, RN Date/Time Eilene Ghazi Time): 10/02/2019 11:49:09 AM Recording on secondary number that call cannot be completed as dialed. Message send to the lead PC to check the number. User: Dory Larsen Date/Time Eilene Ghazi Time): 10/02/2019 11:50:11 AM Caller did not provide a secondary number. User: Dory Larsen Date/Time Eilene Ghazi Time): 10/02/2019 11:52:58 AM Secondary number came from ans svc but was not verified by caller. Referrals GO TO FACILITY OTHER - SPECIFY

## 2019-10-02 NOTE — ED Notes (Signed)
Dr. Goodman at the bedside 

## 2019-10-02 NOTE — ED Provider Notes (Signed)
[facility] Emergency Department Provider Note  ____________________________________________   I have reviewed the triage vital signs and the nursing notes.   HISTORY  Chief Complaint Hypertension and Dizziness   History limited by: Not Limited   HPI [name] is a 69 y.o. male who presents to the emergency department today because of concern for dizziness and high blood pressure. The patient has had some dizziness for the past couple of days. Did have some falls yesterday. Patient has history of parkinsons disease. The patient denies any chest pain. No significant shortness of breath.   Records reviewed. Per medical record review patient has a history of HTN.   Past Medical History:  Diagnosis Date  . Arthritis   . Bradycardia   . Cancer [facility]) 2013   skin cancer  . Depression    ptsd  . Dysrhythmia    chronic slow heart rate  . GERD (gastroesophageal reflux disease)   . Headache(784.0)    tension headaches non recent  . History of chicken pox   . History of kidney stones    passed  . Hypertension    treated with HCTZ  . Parkinson's disease ([place])    dx'ed 15 years ago  . PTSD (post-traumatic stress disorder)   . Shortness of breath dyspnea   . Sleep apnea    doesn't use C-pap  . Varicose veins     Patient Active Problem List   Diagnosis Date Noted  . E. coli bacteremia 02/23/2019  . Dysphagia 02/23/2019  . Aspiration pneumonia ([place]) 02/21/2019  . Closed right hip fracture ([place]) 02/16/2019  . Rib pain on right side 09/25/2018  . Skin sore 12/01/2017  . Hematoma 10/18/2017  . Fall at home 10/18/2017  . 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 Radicular pain in right arm 10/09/2016  . GERD (gastroesophageal reflux disease) 07/31/2016  . Trochanteric bursitis 03/03/2016  . Left hand pain 10/30/2015  . Fracture, finger, multiple sites 10/30/2015  . Colon cancer screening 07/23/2015  . Healthcare maintenance 07/23/2015  . Gout  02/18/2015  . Depression 01/06/2015  . Irritation of eyelid 08/20/2014  . Dupuytren's contracture 08/20/2014  . Cough 07/21/2014  . Lumbar stenosis with neurogenic claudication 06/13/2014  . Preop cardiovascular exam 05/28/2014  . S/P deep brain stimulator placement 05/08/2014  . Joint pain 01/22/2014  . Medicare annual wellness visit, initial 01/19/2014  . Advance care planning 01/19/2014  . Parkinson's disease ([place]) 12/13/2013  . PTSD (post-traumatic stress disorder) 06/13/2013  . Erectile dysfunction 06/13/2013  . HLD (hyperlipidemia) 06/13/2013  . Hip pain 06/10/2013  . Pain in joint, shoulder region 06/10/2013  . Right leg swelling 06/03/2013  . Essential hypertension 06/03/2013  . Bradycardia by electrocardiogram 06/03/2013  . Obstructive sleep apnea 03/12/2013  . Spinal stenosis, lumbar region, with neurogenic claudication 12/14/2012    Past Surgical History:  Procedure Laterality Date  . CHOLECYSTECTOMY N/A 10/22/2014   Procedure: LAPAROSCOPIC CHOLECYSTECTOMY WITH INTRAOPERATIVE CHOLANGIOGRAM;  Surgeon: Dia Crawford III, MD;  Location: [facility] ORS;  Service: General;  Laterality: N/A;  . cyst removed      from lip as a child  . INTRAMEDULLARY (IM) NAIL INTERTROCHANTERIC N/A 02/18/2019   Procedure: INTRAMEDULLARY (IM) NAIL INTERTROCHANTRIC, RIGHT,;  Surgeon: Thornton Park, MD;  Location: [facility] ORS;  Service: Orthopedics;  Laterality: N/A;  . LUMBAR LAMINECTOMY/DECOMPRESSION MICRODISCECTOMY Bilateral 12/14/2012   Procedure: Bilateral lumbar three-four, four-five decompressive laminotomy/foraminotomy;  Surgeon: Charlie Pitter, MD;  Location: [place] NEURO ORS;  Service: Neurosurgery;  Laterality: Bilateral;  .  PULSE GENERATOR IMPLANT Bilateral 12/13/2013   Procedure: Bilateral implantable pulse generator placement;  Surgeon: Erline Levine, MD;  Location: [place] NEURO ORS;  Service: Neurosurgery;  Laterality: Bilateral;  Bilateral implantable pulse generator placement  . skin cancer removed      from ears,   12 lft arm  rt leg 15  . SUBTHALAMIC STIMULATOR BATTERY REPLACEMENT Bilateral 07/14/2017   Procedure: BILATERAL IMPLANTED PULSE GENERATOR CHANGE FOR DEEP BRAIN STIMULATOR;  Surgeon: Erline Levine, MD;  Location: [place];  Service: Neurosurgery;  Laterality: Bilateral;  . SUBTHALAMIC STIMULATOR INSERTION Bilateral 12/06/2013   Procedure: SUBTHALAMIC STIMULATOR INSERTION;  Surgeon: Erline Levine, MD;  Location: [place] NEURO ORS;  Service: Neurosurgery;  Laterality: Bilateral;  Bilateral deep brain stimulator placement    Prior to Admission medications   Medication Sig Start Date End Date Taking? Authorizing Provider  carbidopa-levodopa (SINEMET IR) 25-100 MG tablet 2 tabs in the AM, 1 midday, 1 in the evening Patient taking differently: 2 tabs in the AM, 1.5 midday, 1.5 in the evening 04/08/19   Tonia Ghent, MD  cholecalciferol (VITAMIN D) 1000 UNITS tablet Take 1,000 Units by mouth daily.    [provider]  escitalopram (LEXAPRO) 10 MG tablet TAKE 1 TABLET DAILY 07/30/19   Tonia Ghent, MD  Melatonin 10 MG TABS Take 10 mg by mouth at bedtime.    [provider]  memantine (NAMENDA) 10 MG tablet TAKE 1 TABLET TWICE A DAY 02/11/19   Tonia Ghent, MD  naproxen sodium (ALEVE) 220 MG tablet Take 440 mg by mouth 2 (two) times daily as needed (for pain or headache).    [provider]  omeprazole (PRILOSEC) 20 MG capsule TAKE 1 CAPSULE DAILY 06/18/19   Tonia Ghent, MD  traMADol (ULTRAM) 50 MG tablet Take by mouth every 6 (six) hours as needed.    [provider]    Allergies Klonopin [clonazepam], Morphine and related, and Prednisone  Family History  Problem Relation Age of Onset  . Lung cancer Father   . Colon cancer Neg Hx   . Prostate cancer Neg Hx     Social History Social History   Tobacco Use  . Smoking status: Never Smoker  . Smokeless tobacco: Former Network engineer  . Vaping Use: Never used  Substance Use Topics  .  Alcohol use: Yes    Alcohol/week: 0.0 standard drinks    Comment: occasional (twice per month)  . Drug use: No    Review of Systems Constitutional: No fever/chills Eyes: No visual changes. ENT: No sore throat. Cardiovascular: Denies chest pain. Respiratory: Denies shortness of breath. Gastrointestinal: No abdominal pain.  No nausea, no vomiting.  No diarrhea.   Genitourinary: Negative for dysuria. Musculoskeletal: Negative for back pain. Skin: Negative for rash. Neurological: Positive for dizziness. ____________________________________________   PHYSICAL EXAM:  VITAL SIGNS: ED Triage Vitals  Enc Vitals Group     BP 10/02/19 1356 (!) 144/88     Pulse Rate 10/02/19 1356 95     Resp 10/02/19 1356 18     Temp 10/02/19 1356 98.5 F (36.9 C)     Temp Source 10/02/19 1356 Oral     SpO2 10/02/19 1356 100 %     Weight 10/02/19 1357 190 lb (86.2 kg)     Height 10/02/19 1357 5\' 9"  (1.753 m)     Head Circumference --      Peak Flow --      Pain Score 10/02/19 1357 4  Constitutional: Alert and oriented.  Eyes: Conjunctivae are normal.  ENT      Head: Normocephalic and atraumatic.      Nose: No congestion/rhinnorhea.      Mouth/Throat: Mucous membranes are moist.      Neck: No stridor. Hematological/Lymphatic/Immunilogical: No cervical lymphadenopathy. Cardiovascular: Normal rate, regular rhythm.  No murmurs, rubs, or gallops. Respiratory: Normal respiratory effort without tachypnea nor retractions. Breath sounds are clear and equal bilaterally. No wheezes/rales/rhonchi. Gastrointestinal: Soft and non tender. No rebound. No guarding.  Genitourinary: Deferred Musculoskeletal: Normal range of motion in all extremities. No lower extremity edema. Neurologic:  Normal speech and language. No gross focal neurologic deficits are appreciated.  Skin:  Skin is warm, dry and intact. No rash noted. Psychiatric: Mood and affect are normal. Speech and behavior are normal. Patient exhibits  appropriate insight and judgment.  ____________________________________________    LABS (pertinent positives/negatives)  BMP wnl except glu 159 CBC wbc 7.3, hgb 15.3, plt 197 UA hazy, trace leukocytes, 6-10 rbc and wbc  ____________________________________________   EKG  None  ____________________________________________    RADIOLOGY  CT head/cervical spine No acute traumatic injuries  ____________________________________________   PROCEDURES  Procedures  ____________________________________________   INITIAL IMPRESSION / ASSESSMENT AND PLAN / ED COURSE  Pertinent labs & imaging results that were available during my care of the patient were reviewed by me and considered in my medical decision making (see chart for details).   Patient presented to the emergency department today because of concerns for some dizziness and elevated blood pressure.  Patient's blood pressure in the emergency department while slightly high was not as significantly elevated as it was with physical therapy earlier today.  Patient had blood work and urine check.  Urine had some signs of infection however the patient denies any dysuria. Discussed findings with patient and family. Will plan on discharging. Discussed importance of follow up for HTN.   ____________________________________________   FINAL CLINICAL IMPRESSION(S) / ED DIAGNOSES  Final diagnoses:  Dizziness  Hypertension, unspecified type     Note: This dictation was prepared with Dragon dictation. Any transcriptional errors that result from this process are unintentional     Nance Pear, MD 10/03/19 1531

## 2019-10-02 NOTE — Telephone Encounter (Signed)
Noted.  I will await the ER notes.  Thanks. 

## 2019-10-02 NOTE — Telephone Encounter (Signed)
Per chart review tab pt is presently at [facility] ED. 

## 2019-10-02 NOTE — Therapy (Signed)
[place] MAIN [facility] SERVICES [address] [place], [place], [address] Phone: [phone]   Fax:  [phone]  Patient Details  Name: [name] MRN: [id number] Date of Birth: November 24, 1950 Referring Provider:  Sharolyn Douglas, MD  Encounter Date: 10/02/2019    Patient arrived with caregiver, vitals assessed due to recent increase in falls. Systolic pressure higher than normal, orthostatic upon standing. Caregiver educated on need to follow up with physician, assisted patient to car. Patient and caregiver educated on signs and symptoms to be aware of and when/if to go to ER. They verbalized understanding.   Orthostatics:  seated BP: 182/59 with one machine, 193/72  Standing BP: 151/80   two falls, one yesterday in the kitchen falling backwards, the other in the bathroom again. Reports he didn't hit his head     Janna Arch, PT, DPT   10/02/2019, 12:29 PM  [place] MAIN [facility] SERVICES [address] [place], [place], [address] Phone: [phone]   Fax:  [phone]

## 2019-10-02 NOTE — ED Triage Notes (Signed)
Pt arrives POV with caregiver with c/o feeling dizzy and unsteady on his feet x 3-4 days. PT's caregiver stated that she took him to physical therapy today and his BP readings were 190/152, 193/72 and 151/80. Pt ambulatory from lobby with walker, gait slow but steady. PT A&Ox4 and in NAD. Pt has hx of Parkinson's.

## 2019-10-02 NOTE — Telephone Encounter (Signed)
Patient's wife left a voicemail stating that her husband went for physical therapy today and he was not able to have PT because of his blood pressure. Called patient's wife back and was advised that he is home now and is having dizziness and blurred vision. Patient's wife stated that she is sick so his caregiver is taking him to [facility] to be evaluated. Advised Mrs. Brookens that would be the best thing for them to do. Patient's wife stated that he had not had anything to eat and is finishing up eating and they will be leaving for the ER in about 10 minutes. Advised patient's wife if he gets any worse before leaving for the ER to call 911 and she verbalized understanding.

## 2019-10-02 NOTE — Discharge Instructions (Addendum)
Please seek medical attention for any high fevers, chest pain, shortness of breath, change in behavior, persistent vomiting, bloody stool or any other new or concerning symptoms.  

## 2019-10-02 NOTE — ED Notes (Signed)
Unable to get EKG on pt due to pt having a digital brain stimulator bilaterally in chest for hx parkinsons. Dr. Jacqualine Code made aware

## 2019-10-03 ENCOUNTER — Telehealth: Payer: Self-pay | Admitting: Family Medicine

## 2019-10-03 ENCOUNTER — Telehealth: Payer: Self-pay | Admitting: Cardiovascular Disease

## 2019-10-03 NOTE — Telephone Encounter (Signed)
Patient's wife verbalized understanding of Caitlin's recommendations and plan of care.

## 2019-10-03 NOTE — Telephone Encounter (Signed)
BP has been fine today.  Waiting on culture of urine.  Trying to get patient to drink more fluids.  Patient has an appointment with Dr. Rockey Situ on Monday.

## 2019-10-03 NOTE — Telephone Encounter (Signed)
Recent ed visit for elevated bp   Pt c/o BP issue: STAT if pt c/o blurred vision, one-sided weakness or slurred speech  1. What are your last 5 BP readings? See ed notes   190/152  193/72  151/80   2. Are you having any other symptoms (ex. Dizziness, headache, blurred vision, passed out)? dizzy  3. What is your BP issue? Elevated bp see ed visit patient scheduled with caitlin on 6/21 at 1030

## 2019-10-03 NOTE — Telephone Encounter (Signed)
Patient's wife calling back. Got update on patient. Today his BP is 131/67, HR 42. He still has some dizziness upon standing.  They have not called PCP yet. I encouraged them to do so as Dr Damita Dunnings wanted an update. Patient is waiting for results of the urinalysis.  Advised them to continue to monitor symptoms and keep appointment with [place] on Monday. No openings tomorrow in our office.  Advised them to call if any new or worsening symptoms or concerns arise and that there is someone on call if needed after hours or on the weekend. She was appreciative and verbalized understanding.

## 2019-10-03 NOTE — Telephone Encounter (Signed)
Went straight to VM. Left message to call back.

## 2019-10-03 NOTE — Telephone Encounter (Signed)
Thank you.   Most recent BP and BP from the ED visit are reassuring. Recommend he sit down and relax with his feet flat on the floor for 10 minutes prior to checking blood pressure. Recommend he check daily until his follow up and bring both the log and BP cuff with him to appointment. That way we can make sure his cuff is accurate.   Is the dizziness more lightheadedness or sensation that the room is spinning? To prevent dizziness recommend he stay well hydrated, eat small frequent meals, and make position changes slowly.   Loel Dubonnet, NP

## 2019-10-03 NOTE — Telephone Encounter (Signed)
Please get update on patient after emergency room visit.  Thanks.

## 2019-10-04 ENCOUNTER — Other Ambulatory Visit: Payer: Self-pay

## 2019-10-04 ENCOUNTER — Telehealth: Payer: Self-pay | Admitting: Family Medicine

## 2019-10-04 ENCOUNTER — Other Ambulatory Visit: Payer: Medicare PPO

## 2019-10-04 DIAGNOSIS — R42 Dizziness and giddiness: Secondary | ICD-10-CM | POA: Diagnosis not present

## 2019-10-04 DIAGNOSIS — R8290 Unspecified abnormal findings in urine: Secondary | ICD-10-CM | POA: Diagnosis not present

## 2019-10-04 NOTE — Telephone Encounter (Signed)
Noted. Thanks.

## 2019-10-04 NOTE — Telephone Encounter (Signed)
Please check with patient/family.  It appears that urine culture was not done at the emergency room.  I put in the order so it can be done here.  They can drop off a sample if needed.  Thanks.

## 2019-10-04 NOTE — Telephone Encounter (Signed)
Wife advised and will bring in urine for culture.

## 2019-10-05 LAB — URINE CULTURE
MICRO NUMBER:: 10608243
Result:: NO GROWTH
SPECIMEN QUALITY:: ADEQUATE

## 2019-10-07 ENCOUNTER — Ambulatory Visit (INDEPENDENT_AMBULATORY_CARE_PROVIDER_SITE_OTHER): Payer: Medicare PPO

## 2019-10-07 ENCOUNTER — Ambulatory Visit (INDEPENDENT_AMBULATORY_CARE_PROVIDER_SITE_OTHER): Payer: Medicare PPO | Admitting: Family

## 2019-10-07 ENCOUNTER — Encounter: Payer: Self-pay | Admitting: Family

## 2019-10-07 ENCOUNTER — Telehealth: Payer: Self-pay

## 2019-10-07 ENCOUNTER — Other Ambulatory Visit: Payer: Self-pay

## 2019-10-07 ENCOUNTER — Ambulatory Visit: Payer: Medicare PPO

## 2019-10-07 VITALS — BP 130/80 | HR 44 | Ht 69.0 in | Wt 188.0 lb

## 2019-10-07 DIAGNOSIS — E782 Mixed hyperlipidemia: Secondary | ICD-10-CM | POA: Diagnosis not present

## 2019-10-07 DIAGNOSIS — R001 Bradycardia, unspecified: Secondary | ICD-10-CM

## 2019-10-07 DIAGNOSIS — R55 Syncope and collapse: Secondary | ICD-10-CM

## 2019-10-07 DIAGNOSIS — R42 Dizziness and giddiness: Secondary | ICD-10-CM | POA: Diagnosis not present

## 2019-10-07 DIAGNOSIS — I10 Essential (primary) hypertension: Secondary | ICD-10-CM | POA: Diagnosis not present

## 2019-10-07 DIAGNOSIS — I951 Orthostatic hypotension: Secondary | ICD-10-CM

## 2019-10-07 DIAGNOSIS — G20 Parkinson's disease: Secondary | ICD-10-CM | POA: Diagnosis not present

## 2019-10-07 MED ORDER — HYDRALAZINE HCL 25 MG PO TABS
25.0000 mg | ORAL_TABLET | ORAL | 0 refills | Status: DC | PRN
Start: 2019-10-07 — End: 2020-06-15

## 2019-10-07 NOTE — Telephone Encounter (Signed)
Patient aware of results and recommendations. °

## 2019-10-07 NOTE — Telephone Encounter (Signed)
-----   Message from Tonia Ghent, MD sent at 10/06/2019  8:08 PM EDT ----- Please check with patient.  Urine culture is negative.  Please get update on his condition.  Thanks.

## 2019-10-07 NOTE — Patient Instructions (Addendum)
Medication Instructions:  Your physician has recommended you make the following change in your medication:   Take Hydralazine 25mg  as needed for elevated blood pressures. You may take one tablet if your systolic blood pressure (the top number) is >160 on two readings that are one hour apart.  *If you need a refill on your cardiac medications before your next appointment, please call your pharmacy*  Lab Work: No lab work today.  Testing/Procedures: Please wear your ZIO monitor for 2 weeks.   Your physician has requested that you have a carotid duplex. This test is an ultrasound of the carotid arteries in your neck. It looks at blood flow through these arteries that supply the brain with blood. Allow one hour for this exam. There are no restrictions or special instructions.  Your physician has requested that you have an echocardiogram. Echocardiography is a painless test that uses sound waves to create images of your heart. It provides your doctor with information about the size and shape of your heart and how well your heart's chambers and valves are working. This procedure takes approximately one hour. There are no restrictions for this procedure.  Follow-Up: At [facility], you and your health needs are our priority.  As part of our continuing mission to provide you with exceptional heart care, we have created designated Provider Care Teams.  These Care Teams include your primary Cardiologist (physician) and Advanced Practice Providers (APPs -  Physician Assistants and Nurse Practitioners) who all work together to provide you with the care you need, when you need it.  We recommend signing up for the patient portal called "MyChart".  Sign up information is provided on this After Visit Summary.  MyChart is used to connect with patients for Virtual Visits (Telemedicine).  Patients are able to view lab/test results, encounter notes, upcoming appointments, etc.  Non-urgent messages can be sent to  your provider as well.   To learn more about what you can do with MyChart, go to [email or web address].    Your next appointment:   2-3 month(s)  The format for your next appointment:   In Person  Provider:   You may see Ida Rogue, MD or one of the following Advanced Practice Providers on your designated Care Team:    Murray Hodgkins, NP  Christell Faith, PA-C  Marrianne Mood, PA-C  Laurann [place], NP  Other Instructions  Be sure to stay well hydrated to prevent orthostatic hypotension (lightheadedness with standing)

## 2019-10-07 NOTE — Progress Notes (Signed)
Office Visit    Patient Name: [name] Date of Encounter: 10/07/2019  Primary Care Provider:  Tonia Ghent, MD Primary Cardiologist:  Ida Rogue, MD Electrophysiologist:  None   Chief Complaint    [name] is a 69 y.o. male with a hx of HTN, bradycardia, Parkinson's s/p deep brain stimulator 11/2013 for trmor, HLD, depression, possible seizure disorder presents today for 6 month follow up of bradycardia and HTN.   Past Medical History    Past Medical History:  Diagnosis Date  . Arthritis   . Bradycardia   . Cancer [facility]) 2013   skin cancer  . Depression    ptsd  . Dysrhythmia    chronic slow heart rate  . GERD (gastroesophageal reflux disease)   . Headache(784.0)    tension headaches non recent  . History of chicken pox   . History of kidney stones    passed  . Hypertension    treated with HCTZ  . Parkinson's disease ([place])    dx'ed 15 years ago  . PTSD (post-traumatic stress disorder)   . Shortness of breath dyspnea   . Sleep apnea    doesn't use C-pap  . Varicose veins    Past Surgical History:  Procedure Laterality Date  . CHOLECYSTECTOMY N/A 10/22/2014   Procedure: LAPAROSCOPIC CHOLECYSTECTOMY WITH INTRAOPERATIVE CHOLANGIOGRAM;  Surgeon: Dia Crawford III, MD;  Location: [facility] ORS;  Service: General;  Laterality: N/A;  . cyst removed      from lip as a child  . INTRAMEDULLARY (IM) NAIL INTERTROCHANTERIC N/A 02/18/2019   Procedure: INTRAMEDULLARY (IM) NAIL INTERTROCHANTRIC, RIGHT,;  Surgeon: Thornton Park, MD;  Location: [facility] ORS;  Service: Orthopedics;  Laterality: N/A;  . LUMBAR LAMINECTOMY/DECOMPRESSION MICRODISCECTOMY Bilateral 12/14/2012   Procedure: Bilateral lumbar three-four, four-five decompressive laminotomy/foraminotomy;  Surgeon: Charlie Pitter, MD;  Location: [place] [facility];  Service: Neurosurgery;  Laterality: Bilateral;  . PULSE GENERATOR IMPLANT Bilateral 12/13/2013   Procedure: Bilateral implantable pulse generator placement;  Surgeon:  Erline Levine, MD;  Location: [place] [facility];  Service: Neurosurgery;  Laterality: Bilateral;  Bilateral implantable pulse generator placement  . skin cancer removed     from ears,   12 lft arm  rt leg 15  . SUBTHALAMIC STIMULATOR BATTERY REPLACEMENT Bilateral 07/14/2017   Procedure: BILATERAL IMPLANTED PULSE GENERATOR CHANGE FOR DEEP BRAIN STIMULATOR;  Surgeon: Erline Levine, MD;  Location: [place];  Service: Neurosurgery;  Laterality: Bilateral;  . SUBTHALAMIC STIMULATOR INSERTION Bilateral 12/06/2013   Procedure: SUBTHALAMIC STIMULATOR INSERTION;  Surgeon: Erline Levine, MD;  Location: [place] [facility];  Service: Neurosurgery;  Laterality: Bilateral;  Bilateral deep brain stimulator placement    Allergies  Allergies  Allergen Reactions  . Clonazepam Other (See Comments) and Photosensitivity    Worsening mood/depression "Over-sedation" per pt.  . Morphine Other (See Comments)  . Morphine And Related Other (See Comments)    hallucinations  . Prednisone Other (See Comments)    Unclear reaction    History of Present Illness    [name] is a 69 y.o. male with a hx of HTN, bradycardia, atrial tachycardia on monitor 05/2016, Parkinson's s/p deep brain stimulator 11/2013 for tremor, HLD, depression, mild cognitive impairment, possible seizure disorder.   Previously wore cardiac monitor 2018 with primary sinus rhythm and 11 runs of atrial tachycardia which were asymptomatic.   At clinic visit with Dr. Rockey Situ 12/2018 At that time his Losartan was discontinued for low blood pressure. His pravastatin was held to assess if  gait instability would improve.   He had a fall around Halloween and was admitted for ORIF 02/18/20 and subsequently discharged to SNF. He had COVID around Christmas.   Seen in follow up 08/05/19. BP mildly elevated in setting of hip pain. Noted to be slow to recover from hip fracture and was starting PT. Orthostatic precautions were reviewed in setting of frequent falls.   Seen in  ED 10/02/19 due to dizziness and high blood pressure.  Had elevated blood pressure at physical therapy and was orthostatic.  Was sent to the ED with ED blood pressure improved to systolic 440.  Present today with his wife.  Tells me his right foot has been numb for about a week and felt like it has been falling asleep.  Reports no changes to his ambulation with this.  He has not yet mentioned PT, wife tells me that he just mentioned to her yesterday.  When asked to describe dizziness describes it as a spinning. Occasional lightheadedness and near syncope with quick position changes. Dizziness is noted most with standing. Fell twice last Tuesday. Did not think to check BP during those episodes.   Home BP readings since ED visit: (HR routinely 42-50bpm)  6/17 131/67  6/18 128/65  6/19 129/65  6/20 151/68 and 160/71  He also notes intermittent blurry vision. Has upcoming follow up with his neurologist next month.   EKGs/Labs/Other Studies Reviewed:   The following studies were reviewed today:  Long term monitor 05/2016 Event monitor Rhythm is normal sinus 11  runs of atrial tachycardia), the longest lasting 13.3 secs  Symptoms/triggered events were not associated with arrhythmia  EKG:  EKG is ordered today.  The ekg ordered today demonstrates SB 45 bpm with no acute ST/T wave changes.   Recent Labs: 02/22/2019: Magnesium 2.2 10/02/2019: BUN 19; Creatinine, Ser 0.85; Hemoglobin 15.3; Platelets 197; Potassium 4.4; Sodium 140  Recent Lipid Panel    Component Value Date/Time   CHOL 140 10/04/2018 1039   CHOL 185 06/03/2013 1051   TRIG 152.0 (H) 10/04/2018 1039   HDL 33.60 (L) 10/04/2018 1039   HDL 35 (L) 06/03/2013 1051   CHOLHDL 4 10/04/2018 1039   VLDL 30.4 10/04/2018 1039   LDLCALC 76 10/04/2018 1039   LDLCALC 127 (H) 06/03/2013 1051    Home Medications   Current Meds  Medication Sig  . carbidopa-levodopa (SINEMET IR) 25-100 MG tablet 2 tabs in the AM, 1 midday, 1 in the  evening (Patient taking differently: 2 tabs in the AM, 1.5 midday, 1.5 in the evening)  . cholecalciferol (VITAMIN D) 1000 UNITS tablet Take 1,000 Units by mouth daily.  Marland Kitchen escitalopram (LEXAPRO) 10 MG tablet TAKE 1 TABLET DAILY  . Melatonin 10 MG TABS Take 10 mg by mouth at bedtime.  . memantine (NAMENDA) 10 MG tablet TAKE 1 TABLET TWICE A DAY  . naproxen sodium (ALEVE) 220 MG tablet Take 440 mg by mouth 2 (two) times daily as needed (for pain or headache).  Marland Kitchen omeprazole (PRILOSEC) 20 MG capsule TAKE 1 CAPSULE DAILY  . rivastigmine (EXELON) 9.5 mg/24hr 9.5 mg daily.  . traMADol (ULTRAM) 50 MG tablet Take by mouth every 6 (six) hours as needed.    Review of Systems   Review of Systems  Constitutional: Negative for chills, fever and malaise/fatigue.  Cardiovascular: Positive for near-syncope. Negative for chest pain, dyspnea on exertion, leg swelling, orthopnea, palpitations and syncope.  Respiratory: Negative for cough, shortness of breath and wheezing.   Musculoskeletal: Positive for falls.  Gastrointestinal: Negative for nausea and vomiting.  Neurological: Positive for dizziness. Negative for light-headedness and weakness.   All other systems reviewed and are otherwise negative except as noted above.  Physical Exam    VS:  BP 130/80 (BP Location: Left Arm, Patient Position: Sitting, Cuff Size: Normal)   Pulse (!) 44   Ht 5\' 9"  (1.753 m)   Wt 188 lb (85.3 kg)   SpO2 98%   BMI 27.76 kg/m  , BMI Body mass index is 27.76 kg/m. GEN: Well nourished, well developed, in no acute distress. HEENT: normal. Neck: Supple, no JVD, carotid bruits, or masses. Cardiac: bradycardic, no murmurs, rubs, or gallops. No clubbing, cyanosis, edema.  Radials 2+ and equal bilaterally.   Respiratory:  Respirations regular and unlabored, clear to auscultation bilaterally. GI: Soft, nontender, nondistended, BS + x 4. MS: No deformity or atrophy. Skin: Warm and dry, no rash. Neuro:  Strength and sensation  are intact. Psych: Normal affect.  Assessment & Plan    1. HTN/Orthostatic hypotension -BP well controlled today.  Orthostatic vitals in clinic today negative.  Noted history of orthostatic hypotension and falls for which metoprolol has been previously discontinued.  Recent elevated blood pressure as high as 190s at PT with subsequent ED visit with systolic blood pressure 295. Rx PRN hydralazine 25 mg for elevated systolic blood pressure >621.  Educated only to take if they get 2 consecutive readings 1 hour apart of greater than 160.  2. HLD - Pravastatin previously discontinued due to concern it was contributory to LE weakness and gait instability. Since that time has had fall with hip fracture and ORIF. Will defer re-initiation of Pravastatin at this time as the risks outweigh the benefits.   3. Lightheadedness/dizziness/vision changes/near syncope -difficult differential due to his Parkinson's, known orthostatic hypotension/HTN.  14-day ZIO to rule out pauses or significant AV block  Carotid duplex as he is having intermittent vision changes in the setting of lightheadedness/dizziness to rule out carotid stenosis.  Echocardiogram to rule out valvular abnormalities contributory  Encouraged to remain well-hydrated, make position changes carefully, wear compression stockings.  If no significant findings, defer to neurology as his Parkinson's is likely contributory. Could consider future referral to ENT for dizziness.  4. Bradycardia - Stable finding on exam. EKG today SB 45 bpm. Not on any AV nodal blocking agents.  He notes no lightheadedness, dizziness, near syncope.  Reports no syncope.  Anticipate his Parkinson's and autonomic dysfunction is contributory.  14-day ZIO monitor.  Of note, data interpretation may be difficult to hit his subthalamic stimulator due to Parkinson's but unable to exclude potential need for PPM without additional monitoring. Would have low threshold for PPM placement  in setting of autonomic dysfunction in Parkinson's.   5. RLE numbness -reports numbness of right foot for 1 week.  Encouraged to follow-up with PCP.  On exam he has bilateral palpable pedal pulses.  Sensation intact to RLE.   6. Atrial tachycardia - Reports no recurrent symptoms. Avoid AV nodal blocking agents in the setting of bradycardia.   7. Parkinson's - Follows with neurology. Known gait instability associated with frequent falls. Careful monitoring of cardiac medications to prevent worsening falls.  Of note, his subthalamic stimulator in place.  Disposition: Follow up in 3 month(s) with Dr. Rockey Situ or APP.   Loel Dubonnet, NP 10/07/2019, 12:31 PM

## 2019-10-09 ENCOUNTER — Ambulatory Visit: Payer: Medicare PPO

## 2019-10-14 ENCOUNTER — Other Ambulatory Visit: Payer: Self-pay

## 2019-10-14 ENCOUNTER — Ambulatory Visit (INDEPENDENT_AMBULATORY_CARE_PROVIDER_SITE_OTHER): Payer: Medicare PPO | Admitting: Psychology

## 2019-10-14 ENCOUNTER — Ambulatory Visit: Payer: Medicare PPO

## 2019-10-14 DIAGNOSIS — M25551 Pain in right hip: Secondary | ICD-10-CM

## 2019-10-14 DIAGNOSIS — F4323 Adjustment disorder with mixed anxiety and depressed mood: Secondary | ICD-10-CM | POA: Diagnosis not present

## 2019-10-14 DIAGNOSIS — G20 Parkinson's disease: Secondary | ICD-10-CM

## 2019-10-14 DIAGNOSIS — F331 Major depressive disorder, recurrent, moderate: Secondary | ICD-10-CM

## 2019-10-14 DIAGNOSIS — R2689 Other abnormalities of gait and mobility: Secondary | ICD-10-CM | POA: Diagnosis not present

## 2019-10-14 DIAGNOSIS — M6281 Muscle weakness (generalized): Secondary | ICD-10-CM

## 2019-10-14 DIAGNOSIS — R2681 Unsteadiness on feet: Secondary | ICD-10-CM

## 2019-10-14 NOTE — Therapy (Signed)
[place] MAIN [facility] [address] [place], [place], [address] Phone: [phone]   Fax:  [phone]  Physical Therapy Treatment/RECERT/Physical Therapy Progress Note   Dates of reporting period  08/21/19  to   10/14/19  Patient Details  Name: [name] MRN: [id number] Date of Birth: 20-Sep-1950 Referring Provider (PT): Sharolyn Douglas MD    Encounter Date: 10/14/2019   PT End of Session - 10/14/19 1108    Visit Number 10    Number of Visits 26    Date for PT Re-Evaluation 12/09/19    Authorization Type 10/10; eval 5/5/ 21; next session 1/10 PN 6/28    PT Start Time 1015    PT Stop Time 1058    PT Time Calculation (min) 43 min    Equipment Utilized During Treatment Gait belt    Activity Tolerance Patient tolerated treatment well;Patient limited by pain    Behavior During Therapy WFL for tasks assessed/performed           Past Medical History:  Diagnosis Date  . Arthritis   . Bradycardia   . Cancer [facility]) 2013   skin cancer  . Depression    ptsd  . Dysrhythmia    chronic slow heart rate  . GERD (gastroesophageal reflux disease)   . Headache(784.0)    tension headaches non recent  . History of chicken pox   . History of kidney stones    passed  . Hypertension    treated with HCTZ  . Parkinson's disease ([place])    dx'ed 15 years ago  . PTSD (post-traumatic stress disorder)   . Shortness of breath dyspnea   . Sleep apnea    doesn't use C-pap  . Varicose veins     Past Surgical History:  Procedure Laterality Date  . CHOLECYSTECTOMY N/A 10/22/2014   Procedure: LAPAROSCOPIC CHOLECYSTECTOMY WITH INTRAOPERATIVE CHOLANGIOGRAM;  Surgeon: Dia Crawford III, MD;  Location: [facility] ORS;  Service: General;  Laterality: N/A;  . cyst removed      from lip as a child  . INTRAMEDULLARY (IM) NAIL INTERTROCHANTERIC N/A 02/18/2019   Procedure: INTRAMEDULLARY (IM) NAIL INTERTROCHANTRIC, RIGHT,;  Surgeon: Thornton Park, MD;  Location:  [facility] ORS;  Service: Orthopedics;  Laterality: N/A;  . LUMBAR LAMINECTOMY/DECOMPRESSION MICRODISCECTOMY Bilateral 12/14/2012   Procedure: Bilateral lumbar three-four, four-five decompressive laminotomy/foraminotomy;  Surgeon: Charlie Pitter, MD;  Location: [place] [facility];  Service: Neurosurgery;  Laterality: Bilateral;  . PULSE GENERATOR IMPLANT Bilateral 12/13/2013   Procedure: Bilateral implantable pulse generator placement;  Surgeon: Erline Levine, MD;  Location: [place] [facility];  Service: Neurosurgery;  Laterality: Bilateral;  Bilateral implantable pulse generator placement  . skin cancer removed     from ears,   12 lft arm  rt leg 15  . SUBTHALAMIC STIMULATOR BATTERY REPLACEMENT Bilateral 07/14/2017   Procedure: BILATERAL IMPLANTED PULSE GENERATOR CHANGE FOR DEEP BRAIN STIMULATOR;  Surgeon: Erline Levine, MD;  Location: [place];  Service: Neurosurgery;  Laterality: Bilateral;  . SUBTHALAMIC STIMULATOR INSERTION Bilateral 12/06/2013   Procedure: SUBTHALAMIC STIMULATOR INSERTION;  Surgeon: Erline Levine, MD;  Location: [place] [facility];  Service: Neurosurgery;  Laterality: Bilateral;  Bilateral deep brain stimulator placement    There were no vitals filed for this visit.   Subjective Assessment - 10/14/19 1106    Subjective Patient has been to the cardiologist and now is wearing a heart tracker. No recent falls, blood pressure has stabilized    Patient is accompained by: Family member  Pertinent History [name] is a 69 y.o. male with a hx of HTN, bradycardia, Parkinson's s/p deep brain stimulator 11/2013 , HLD, depression, possible seizure disorder, arthritis, depression/PTSD, dysrhythmia, GERD, sleep apnea. Patient was seen by this therapist in years past. Patient had a fall around Halloween of 2020 and admitted for ORIF on 02/18/19, was discharged to SNF and had [place] around Christmas. Had home health therapy for a month and a half. After that tried to go to Outpatient Therapy for 3 sessions at [facility] and was not pleased due to how busy and crowded the location was. Fell last week in bathroom, didn't take walker in, falls about 2x month. Is very weak in RLE. Patient has a new lift chair and U drive walking. Has a caregiver 4 days a week.    Limitations Walking;Standing;Sitting;House hold activities;Lifting    How long can you sit comfortably? not limited with a backrest, without a back rest 3 minutes    How long can you stand comfortably? 3-5 minutes with holding on.    How long can you walk comfortably? with U step 6 minutes.    Patient Stated Goals pain reduction of R hip. strength of legs, dance with wife. push lawnmower.    Currently in Pain? Yes    Pain Score 3     Pain Location Leg    Pain Orientation Right    Pain Descriptors / Indicators Aching    Pain Type Chronic pain;Surgical pain    Pain Radiating Towards r knee    Pain Onset More than a month ago    Pain Frequency Intermittent    Aggravating Factors  weightbearing, prolonged mobility    Pain Relieving Factors rest    Effect of Pain on Daily Activities limits walking and transfers                [facility] PT Assessment - 10/14/19 0001      Standardized Balance Assessment   Standardized Balance Assessment Berg Balance Test      Berg Balance Test   Sit to Stand Able to stand  independently using hands    Standing Unsupported Able to stand 2 minutes with supervision    Sitting with Back Unsupported but Feet Supported on Floor or Stool Able to sit safely and securely 2 minutes    Stand to Sit Sits safely with minimal use of hands    Transfers Able to transfer safely, definite need of hands    Standing Unsupported with Eyes Closed Able to stand 10 seconds with supervision    Standing Unsupported with Feet Together Able to place feet together independently but unable to hold for 30 seconds    From Standing, Reach Forward with Outstretched Arm Can reach forward >5 cm safely (2")    From Standing Position, Pick up  Object from Floor Able to pick up shoe, needs supervision    From Standing Position, Turn to Look Behind Over each Shoulder Looks behind from both sides and weight shifts well    Turn 360 Degrees Able to turn 360 degrees safely but slowly    Standing Unsupported, Alternately Place Feet on Step/Stool Able to complete >2 steps/needs minimal assist    Standing Unsupported, One Foot in Front Able to take small step independently and hold 30 seconds    Standing on One Leg Unable to try or needs assist to prevent fall    Total Score 36  Vitals at start of session 142/79  Goals:  5x STS: 12.32 : no UE support  10 MWT: 10.9 seconds with rollator =0.917 BERG: 36/56  FOTO: 38%  Pain in R hip to knee 4-5/10; wants to be able to move with less pain.   New goal: 6 min walk test: 750 ft with u step rollator    Supine:  Roller to R quad for reduction of muscle tissue dysfunction for pain relief x 4 minutes  LE rotation 60 seconds R hamstring stretch 60 seconds on PT shoulder  R hamstring stretch with calf overpressure 60 seconds Sidelying:  -hip extension 10x min A RLE   Patient's condition has the potential to improve in response to therapy. Maximum improvement is yet to be obtained. The anticipated improvement is attainable and reasonable in a generally predictable time.  Patient reports his balance is getting better but still hurts in his right leg limiting his mobility.                   PT Education - 10/14/19 1108    Education provided Yes    Education Details goals, POC    Person(s) Educated Patient    Methods Explanation;Demonstration;Verbal cues;Tactile cues    Comprehension Verbalized understanding;Returned demonstration;Verbal cues required;Tactile cues required            PT Short Term Goals - 10/14/19 1304      PT SHORT TERM GOAL #1   Title Pt will perform HEP with family's/aide's supervision, for improved balance, transfers, and gait.     Baseline 5/5: HEP given 6/28: HEP compliant    Time 4    Period Weeks    Status Partially Met    Target Date 09/19/19             PT Long Term Goals - 10/14/19 1305      PT LONG TERM GOAL #1   Title Patient (> 84 years old) will complete five times sit to stand test in < 15 seconds without UE support without LOB indicating an increased LE strength and improved balance.    Baseline 5/5: 19 seconds one posterior LOB 6/28: 12.32 no UE support    Time 8    Period Weeks    Status Achieved      PT LONG TERM GOAL #2   Title Patient will increase 10 meter walk test to >1.12ms as to improve gait speed for better community ambulation and to reduce fall risk.    Baseline 4/4: 0.71 m/s with U step walker 6/28: 0.917 m/s with U step rollator    Time 8    Period Weeks    Status Partially Met    Target Date 12/09/19      PT LONG TERM GOAL #3   Title Patient will increase Berg Balance score by > 6 points (32/56) to demonstrate decreased fall risk during functional activities.    Baseline 5/5: 26/56 6/28: 36/56    Time 8    Period Weeks    Status Achieved      PT LONG TERM GOAL #4   Title Patient will increase FOTO score to equal to or greater than 31/100  to demonstrate statistically significant improvement in mobility and quality of life.    Baseline 5/5: 12/100 6/28: 38%    Time 8    Period Weeks    Status Achieved      PT LONG TERM GOAL #5   Title Patient will increase six minute walk  test distance to >1000 for progression to community ambulator and improve gait ability    Baseline 6/28: 750 ft with U step rollator    Time 8    Period Weeks    Status New    Target Date 12/09/19      PT LONG TERM GOAL #6   Title Patient will increase Berg Balance score by > 6 points (42/56) to demonstrate decreased fall risk during functional activities.    Baseline 6/28: 36/56    Time 8    Period Weeks    Status New    Target Date 12/09/19                 Plan - 10/14/19 1304     Clinical Impression Statement Patient demonstrates excellent progress towards functional goals; progressing BERG by 10 points, 10 MWT to almost meeting goal, and meeting 5x STS goal. FOTO additionally improved. New goal of prolonged ambulation added to POC for functional mobility. Patient's condition has the potential to improve in response to therapy. Maximum improvement is yet to be obtained. The anticipated improvement is attainable and reasonable in a generally predictable time.Patient would benefit from skilled physical therapy to increase strength, stability, balance, and decrease falls risk to improve quality of life    Personal Factors and Comorbidities Age;Comorbidity 3+;Past/Current Experience;Time since onset of injury/illness/exacerbation;Transportation    Comorbidities athritis, bradycardia, cancer, depression, dysrhythmia, GERD, HTN, Parkinson's, PTSD, SOB, sleep apnea    Examination-Activity Limitations Bathing;Bed Mobility;Caring for Others;Bend;Dressing;Lift;Locomotion Level;Reach Overhead;Sit;Squat;Stairs;Stand;Toileting;Transfers    Examination-Participation Restrictions Church;Cleaning;Community Activity;Interpersonal Relationship;Shop;Volunteer;Yard Work;Other    Stability/Clinical Decision Making Unstable/Unpredictable    Rehab Potential Fair    PT Frequency 2x / week    PT Duration 8 weeks    PT Treatment/Interventions ADLs/Self Care Home Management;Aquatic Therapy;Cryotherapy;Electrical Stimulation;Iontophoresis '4mg'$ /ml Dexamethasone;Moist Heat;Ultrasound;Contrast Bath;DME Instruction;Gait training;Stair training;Functional mobility training;Therapeutic activities;Therapeutic exercise;Balance training;Neuromuscular re-education;Patient/family education;Manual techniques;Wheelchair mobility training;Energy conservation;Passive range of motion;Traction;Orthotic Fit/Training;Dry needling;Vestibular;Visual/perceptual remediation/compensation;Taping;Canalith Repostioning    PT Next Visit  Plan HEP, balance RLE strength, hip extension    PT Home Exercise Plan [email or web address]    Consulted and Agree with Plan of Care Patient;Family member/caregiver    Family Member Consulted wife           Patient will benefit from skilled therapeutic intervention in order to improve the following deficits and impairments:  Abnormal gait, Decreased balance, Decreased coordination, Decreased mobility, Impaired tone, Postural dysfunction, Decreased strength, Decreased safety awareness, Improper body mechanics, Impaired flexibility, Decreased activity tolerance, Decreased endurance, Decreased knowledge of precautions, Difficulty walking, Pain, Cardiopulmonary status limiting activity, Decreased range of motion, Impaired perceived functional ability  Visit Diagnosis: Pain in right hip  Unsteadiness on feet  Muscle weakness (generalized)  Other abnormalities of gait and mobility  Parkinson's disease ([place])     Problem List Patient Active Problem List   Diagnosis Date Noted  . E. coli bacteremia 02/23/2019  . Dysphagia 02/23/2019  . Aspiration pneumonia ([place]) 02/21/2019  . Closed right hip fracture ([place]) 02/16/2019  . Rib pain on right side 09/25/2018  . Skin sore 12/01/2017  . Hematoma 10/18/2017  . Fall at home 10/18/2017  . 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 Radicular pain in right arm 10/09/2016  . GERD (gastroesophageal reflux disease) 07/31/2016  . Trochanteric bursitis 03/03/2016  . Left hand pain 10/30/2015  . Fracture, finger, multiple sites 10/30/2015  . Colon cancer screening 07/23/2015  . Healthcare maintenance 07/23/2015  . Gout 02/18/2015  . Depression 01/06/2015  . Irritation of eyelid 08/20/2014  .  Dupuytren's contracture 08/20/2014  . Cough 07/21/2014  . Lumbar stenosis with neurogenic claudication 06/13/2014  . Preop cardiovascular exam 05/28/2014  . S/P deep brain stimulator placement 05/08/2014  . Joint pain 01/22/2014  . Medicare annual wellness visit,  initial 01/19/2014  . Advance care planning 01/19/2014  . Parkinson's disease ([place]) 12/13/2013  . PTSD (post-traumatic stress disorder) 06/13/2013  . Erectile dysfunction 06/13/2013  . HLD (hyperlipidemia) 06/13/2013  . Hip pain 06/10/2013  . Pain in joint, shoulder region 06/10/2013  . Right leg swelling 06/03/2013  . Essential hypertension 06/03/2013  . Bradycardia by electrocardiogram 06/03/2013  . Obstructive sleep apnea 03/12/2013  . Spinal stenosis, lumbar region, with neurogenic claudication 12/14/2012   Janna Arch, PT, DPT   10/14/2019, 1:08 PM  [place] MAIN [facility] [address] [place], [place], [address] Phone: [phone]   Fax:  [phone]  Name: TYQUAN CARMICKLE MRN: [id number] Date of Birth: [birth year]

## 2019-10-14 NOTE — Progress Notes (Signed)
Thanks.     Physician Documentation  Your signature is required to indicate approval of the treatment plan as stated above. By signing this report, you are approving the plan of care. Please sign and either send electronically or print and fax the signed copy to the number below. If you approve with modifications, please indicate those in the space provided._________________  Physician Signature: ____Graham Duncan__  Date:____06/28/21 ___ Time:__1:50 PM ___

## 2019-10-16 ENCOUNTER — Ambulatory Visit: Payer: Medicare PPO

## 2019-10-16 ENCOUNTER — Other Ambulatory Visit: Payer: Self-pay

## 2019-10-16 DIAGNOSIS — R2689 Other abnormalities of gait and mobility: Secondary | ICD-10-CM | POA: Diagnosis not present

## 2019-10-16 DIAGNOSIS — G20 Parkinson's disease: Secondary | ICD-10-CM

## 2019-10-16 DIAGNOSIS — M6281 Muscle weakness (generalized): Secondary | ICD-10-CM

## 2019-10-16 DIAGNOSIS — M25551 Pain in right hip: Secondary | ICD-10-CM

## 2019-10-16 DIAGNOSIS — R2681 Unsteadiness on feet: Secondary | ICD-10-CM

## 2019-10-16 NOTE — Therapy (Signed)
[place] MAIN [facility] SERVICES [address] [place], [place], [address] Phone: [phone]   Fax:  [phone]  Physical Therapy Treatment  Patient Details  Name: [name] MRN: [id number] Date of Birth: 1951/03/02 Referring Provider (PT): Sharolyn Douglas MD    Encounter Date: 10/16/2019   PT End of Session - 10/16/19 1036    Visit Number 11    Number of Visits 26    Date for PT Re-Evaluation 12/09/19    Authorization Type 1/10 PN 6/28    PT Start Time 1030    PT Stop Time 1114    PT Time Calculation (min) 44 min    Equipment Utilized During Treatment Gait belt    Activity Tolerance Patient tolerated treatment well;Patient limited by pain    Behavior During Therapy [facility] for tasks assessed/performed           Past Medical History:  Diagnosis Date  . Arthritis   . Bradycardia   . Cancer [facility]) 2013   skin cancer  . Depression    ptsd  . Dysrhythmia    chronic slow heart rate  . GERD (gastroesophageal reflux disease)   . Headache(784.0)    tension headaches non recent  . History of chicken pox   . History of kidney stones    passed  . Hypertension    treated with HCTZ  . Parkinson's disease ([place])    dx'ed 15 years ago  . PTSD (post-traumatic stress disorder)   . Shortness of breath dyspnea   . Sleep apnea    doesn't use C-pap  . Varicose veins     Past Surgical History:  Procedure Laterality Date  . CHOLECYSTECTOMY N/A 10/22/2014   Procedure: LAPAROSCOPIC CHOLECYSTECTOMY WITH INTRAOPERATIVE CHOLANGIOGRAM;  Surgeon: Dia Crawford III, MD;  Location: [facility] ORS;  Service: General;  Laterality: N/A;  . cyst removed      from lip as a child  . INTRAMEDULLARY (IM) NAIL INTERTROCHANTERIC N/A 02/18/2019   Procedure: INTRAMEDULLARY (IM) NAIL INTERTROCHANTRIC, RIGHT,;  Surgeon: Thornton Park, MD;  Location: [facility] ORS;  Service: Orthopedics;  Laterality: N/A;  . LUMBAR LAMINECTOMY/DECOMPRESSION MICRODISCECTOMY Bilateral 12/14/2012    Procedure: Bilateral lumbar three-four, four-five decompressive laminotomy/foraminotomy;  Surgeon: Charlie Pitter, MD;  Location: [place] NEURO ORS;  Service: Neurosurgery;  Laterality: Bilateral;  . PULSE GENERATOR IMPLANT Bilateral 12/13/2013   Procedure: Bilateral implantable pulse generator placement;  Surgeon: Erline Levine, MD;  Location: [place] NEURO ORS;  Service: Neurosurgery;  Laterality: Bilateral;  Bilateral implantable pulse generator placement  . skin cancer removed     from ears,   12 lft arm  rt leg 15  . SUBTHALAMIC STIMULATOR BATTERY REPLACEMENT Bilateral 07/14/2017   Procedure: BILATERAL IMPLANTED PULSE GENERATOR CHANGE FOR DEEP BRAIN STIMULATOR;  Surgeon: Erline Levine, MD;  Location: [place];  Service: Neurosurgery;  Laterality: Bilateral;  . SUBTHALAMIC STIMULATOR INSERTION Bilateral 12/06/2013   Procedure: SUBTHALAMIC STIMULATOR INSERTION;  Surgeon: Erline Levine, MD;  Location: [place] NEURO ORS;  Service: Neurosurgery;  Laterality: Bilateral;  Bilateral deep brain stimulator placement    There were no vitals filed for this visit.   Subjective Assessment - 10/16/19 1034    Subjective Patient reports no falls or LOB since last session. Blood pressure was [facility] this morning per aide    Patient is accompained by: Family member    Pertinent History [name] is a 69 y.o. male with a hx of HTN, bradycardia, Parkinson's s/p deep brain stimulator 11/2013 , HLD, depression,  possible seizure disorder, arthritis, depression/PTSD, dysrhythmia, GERD, sleep apnea. Patient was seen by this therapist in years past. Patient had a fall around Halloween of 2020 and admitted for ORIF on 02/18/19, was discharged to SNF and had [place] around Christmas. Had home health therapy for a month and a half. After that tried to go to Outpatient Therapy for 3 sessions at [facility] and was not pleased due to how busy and crowded the location was. Fell last week in bathroom, didn't take walker in, falls about 2x month. Is very  weak in RLE. Patient has a new lift chair and U drive walking. Has a caregiver 4 days a week.    Limitations Walking;Standing;Sitting;House hold activities;Lifting    How long can you sit comfortably? not limited with a backrest, without a back rest 3 minutes    How long can you stand comfortably? 3-5 minutes with holding on.    How long can you walk comfortably? with U step 6 minutes.    Patient Stated Goals pain reduction of R hip. strength of legs, dance with wife. push lawnmower.    Currently in Pain? Yes    Pain Score 3     Pain Location Back    Pain Orientation Lower    Pain Descriptors / Indicators Aching    Pain Type Chronic pain    Pain Radiating Towards RLE    Pain Onset More than a month ago    Pain Score 5    Pain Location Leg    Pain Orientation Right    Pain Descriptors / Indicators Aching;Throbbing    Pain Type Chronic pain    Pain Onset More than a month ago                  Treatment: Supine: educated aide on performing at home   Roller to R quad for reduction of muscle tissue dysfunction for pain relief x 6 minutes  SAD to R hip 3x 30 second inferior pull for pain relief with belt  SAD in figure 4 position to R hip 3x 30 second inferior pull for pain relief with belt  SLR 10x each LE, focus on quad set prior to lifting LE   Standing in // bars: cues for sequencing, upright posture, close CGA:     -side step orange hurdle decrease from BUE support so SUE, more challenging LLE. 20x each side; some pain in R leg pain.    Ambulate with walker , 40 ft x 2 trials, cues for upright posture and equal step length, close CGA   Sit to stand reach 16x reach for a basketball, stand w/o UE support finding COM then shooting into hoop    Gait speed with sudden termination/initiation of movement: red to orange cone: slow exaggerated steps; orange to yellow: normal gait speed: yellow to green cone: fast walking. Sudden stop at each cone for stability with sudden  initiation/termiantion of ambulation. Focus on widening BOS for reduction of LOB. X 6 trials     Pt educated throughout session about proper posture and technique with exercises. Improved exercise technique, movement at target joints, use of target muscles after min to mod verbal, visual, tactile cues.   Patient's session initially limited by R hip pain requiring reduction of pain prior to performing interventions. Change of gait speed as well as sudden termination/initiation of movement is challenging to patient with frequent narrowing of BOS resulting in near LOB. Patient would benefit from skilled physical therapy to increase strength,  stability, balance, and decrease falls risk to improve quality of life                  PT Education - 10/16/19 1036    Education provided Yes    Education Details exercise technique, body mechanics    Person(s) Educated Patient    Methods Explanation;Demonstration;Tactile cues;Verbal cues    Comprehension Verbalized understanding;Returned demonstration;Verbal cues required;Tactile cues required            PT Short Term Goals - 10/14/19 1304      PT SHORT TERM GOAL #1   Title Pt will perform HEP with family's/aide's supervision, for improved balance, transfers, and gait.    Baseline 5/5: HEP given 6/28: HEP compliant    Time 4    Period Weeks    Status Partially Met    Target Date 09/19/19             PT Long Term Goals - 10/14/19 1305      PT LONG TERM GOAL #1   Title Patient (> 2 years old) will complete five times sit to stand test in < 15 seconds without UE support without LOB indicating an increased LE strength and improved balance.    Baseline 5/5: 19 seconds one posterior LOB 6/28: 12.32 no UE support    Time 8    Period Weeks    Status Achieved      PT LONG TERM GOAL #2   Title Patient will increase 10 meter walk test to >1.65m/s as to improve gait speed for better community ambulation and to reduce fall risk.     Baseline 4/4: 0.71 m/s with U step walker 6/28: 0.917 m/s with U step rollator    Time 8    Period Weeks    Status Partially Met    Target Date 12/09/19      PT LONG TERM GOAL #3   Title Patient will increase Berg Balance score by > 6 points (32/56) to demonstrate decreased fall risk during functional activities.    Baseline 5/5: 26/56 6/28: 36/56    Time 8    Period Weeks    Status Achieved      PT LONG TERM GOAL #4   Title Patient will increase FOTO score to equal to or greater than 31/100  to demonstrate statistically significant improvement in mobility and quality of life.    Baseline 5/5: 12/100 6/28: 38%    Time 8    Period Weeks    Status Achieved      PT LONG TERM GOAL #5   Title Patient will increase six minute walk test distance to >1000 for progression to community ambulator and improve gait ability    Baseline 6/28: 750 ft with U step rollator    Time 8    Period Weeks    Status New    Target Date 12/09/19      PT LONG TERM GOAL #6   Title Patient will increase Berg Balance score by > 6 points (42/56) to demonstrate decreased fall risk during functional activities.    Baseline 6/28: 36/56    Time 8    Period Weeks    Status New    Target Date 12/09/19                 Plan - 10/16/19 1422    Clinical Impression Statement Patient's session initially limited by R hip pain requiring reduction of pain prior to performing interventions. Change of gait  speed as well as sudden termination/initiation of movement is challenging to patient with frequent narrowing of BOS resulting in near LOB. Patient would benefit from skilled physical therapy to increase strength, stability, balance, and decrease falls risk to improve quality of life    Personal Factors and Comorbidities Age;Comorbidity 3+;Past/Current Experience;Time since onset of injury/illness/exacerbation;Transportation    Comorbidities athritis, bradycardia, cancer, depression, dysrhythmia, GERD, HTN,  Parkinson's, PTSD, SOB, sleep apnea    Examination-Activity Limitations Bathing;Bed Mobility;Caring for Others;Bend;Dressing;Lift;Locomotion Level;Reach Overhead;Sit;Squat;Stairs;Stand;Toileting;Transfers    Examination-Participation Restrictions Church;Cleaning;Community Activity;Interpersonal Relationship;Shop;Volunteer;Yard Work;Other    Stability/Clinical Decision Making Unstable/Unpredictable    Rehab Potential Fair    PT Frequency 2x / week    PT Duration 8 weeks    PT Treatment/Interventions ADLs/Self Care Home Management;Aquatic Therapy;Cryotherapy;Electrical Stimulation;Iontophoresis '4mg'$ /ml Dexamethasone;Moist Heat;Ultrasound;Contrast Bath;DME Instruction;Gait training;Stair training;Functional mobility training;Therapeutic activities;Therapeutic exercise;Balance training;Neuromuscular re-education;Patient/family education;Manual techniques;Wheelchair mobility training;Energy conservation;Passive range of motion;Traction;Orthotic Fit/Training;Dry needling;Vestibular;Visual/perceptual remediation/compensation;Taping;Canalith Repostioning    PT Next Visit Plan HEP, balance RLE strength, hip extension    PT Home Exercise Plan [email or web address]    Consulted and Agree with Plan of Care Patient;Family member/caregiver    Family Member Consulted wife           Patient will benefit from skilled therapeutic intervention in order to improve the following deficits and impairments:  Abnormal gait, Decreased balance, Decreased coordination, Decreased mobility, Impaired tone, Postural dysfunction, Decreased strength, Decreased safety awareness, Improper body mechanics, Impaired flexibility, Decreased activity tolerance, Decreased endurance, Decreased knowledge of precautions, Difficulty walking, Pain, Cardiopulmonary status limiting activity, Decreased range of motion, Impaired perceived functional ability  Visit Diagnosis: Pain in right hip  Unsteadiness on feet  Muscle weakness (generalized)  Other  abnormalities of gait and mobility  Parkinson's disease ([place])     Problem List Patient Active Problem List   Diagnosis Date Noted  . E. coli bacteremia 02/23/2019  . Dysphagia 02/23/2019  . Aspiration pneumonia ([place]) 02/21/2019  . Closed right hip fracture ([place]) 02/16/2019  . Rib pain on right side 09/25/2018  . Skin sore 12/01/2017  . Hematoma 10/18/2017  . Fall at home 10/18/2017  . 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 Radicular pain in right arm 10/09/2016  . GERD (gastroesophageal reflux disease) 07/31/2016  . Trochanteric bursitis 03/03/2016  . Left hand pain 10/30/2015  . Fracture, finger, multiple sites 10/30/2015  . Colon cancer screening 07/23/2015  . Healthcare maintenance 07/23/2015  . Gout 02/18/2015  . Depression 01/06/2015  . Irritation of eyelid 08/20/2014  . Dupuytren's contracture 08/20/2014  . Cough 07/21/2014  . Lumbar stenosis with neurogenic claudication 06/13/2014  . Preop cardiovascular exam 05/28/2014  . S/P deep brain stimulator placement 05/08/2014  . Joint pain 01/22/2014  . Medicare annual wellness visit, initial 01/19/2014  . Advance care planning 01/19/2014  . Parkinson's disease ([place]) 12/13/2013  . PTSD (post-traumatic stress disorder) 06/13/2013  . Erectile dysfunction 06/13/2013  . HLD (hyperlipidemia) 06/13/2013  . Hip pain 06/10/2013  . Pain in joint, shoulder region 06/10/2013  . Right leg swelling 06/03/2013  . Essential hypertension 06/03/2013  . Bradycardia by electrocardiogram 06/03/2013  . Obstructive sleep apnea 03/12/2013  . Spinal stenosis, lumbar region, with neurogenic claudication 12/14/2012   Janna Arch, PT, DPT   10/16/2019, 2:23 PM  [place] MAIN [facility] SERVICES [address] [place], [place], [address] Phone: [phone]   Fax:  [phone]  Name: [name] MRN: [id number] Date of Birth: 10/02/1950

## 2019-10-23 ENCOUNTER — Ambulatory Visit: Payer: Medicare PPO | Attending: Neurology | Admitting: Physical Therapy

## 2019-10-23 ENCOUNTER — Other Ambulatory Visit: Payer: Self-pay

## 2019-10-23 ENCOUNTER — Encounter: Payer: Self-pay | Admitting: Physical Therapy

## 2019-10-23 VITALS — BP 128/67 | HR 51

## 2019-10-23 DIAGNOSIS — M6281 Muscle weakness (generalized): Secondary | ICD-10-CM | POA: Diagnosis not present

## 2019-10-23 DIAGNOSIS — G20 Parkinson's disease: Secondary | ICD-10-CM | POA: Diagnosis not present

## 2019-10-23 DIAGNOSIS — R2689 Other abnormalities of gait and mobility: Secondary | ICD-10-CM

## 2019-10-23 DIAGNOSIS — R2681 Unsteadiness on feet: Secondary | ICD-10-CM | POA: Diagnosis not present

## 2019-10-23 DIAGNOSIS — M25551 Pain in right hip: Secondary | ICD-10-CM | POA: Diagnosis not present

## 2019-10-23 NOTE — Therapy (Signed)
[place] MAIN [facility] SERVICES [address] [place], [place], [address] Phone: [phone]   Fax:  [phone]  Physical Therapy Treatment  Patient Details  Name: [name] MRN: [id number] Date of Birth: 24-Jan-1951 Referring Provider (PT): Sharolyn Douglas MD    Encounter Date: 10/23/2019   PT End of Session - 10/23/19 1010    Visit Number 12    Number of Visits 26    Date for PT Re-Evaluation 12/09/19    Authorization Type 2/10 PN 6/28    Authorization Time Period FOTO- PT    PT Start Time 1014    PT Stop Time 1100    PT Time Calculation (min) 46 min    Equipment Utilized During Treatment Gait belt    Activity Tolerance Patient tolerated treatment well;Patient limited by pain    Behavior During Therapy [facility] for tasks assessed/performed           Past Medical History:  Diagnosis Date   Arthritis    Bradycardia    Cancer ([place]) 2013   skin cancer   Depression    ptsd   Dysrhythmia    chronic slow heart rate   GERD (gastroesophageal reflux disease)    Headache(784.0)    tension headaches non recent   History of chicken pox    History of kidney stones    passed   Hypertension    treated with HCTZ   Parkinson's disease ([place])    dx'ed 15 years ago   PTSD (post-traumatic stress disorder)    Shortness of breath dyspnea    Sleep apnea    doesn't use C-pap   Varicose veins     Past Surgical History:  Procedure Laterality Date   CHOLECYSTECTOMY N/A 10/22/2014   Procedure: LAPAROSCOPIC CHOLECYSTECTOMY WITH INTRAOPERATIVE CHOLANGIOGRAM;  Surgeon: Dia Crawford III, MD;  Location: [facility] ORS;  Service: General;  Laterality: N/A;   cyst removed      from lip as a child   INTRAMEDULLARY (IM) NAIL INTERTROCHANTERIC N/A 02/18/2019   Procedure: INTRAMEDULLARY (IM) NAIL INTERTROCHANTRIC, RIGHT,;  Surgeon: Thornton Park, MD;  Location: [facility] ORS;  Service: Orthopedics;  Laterality: N/A;   LUMBAR LAMINECTOMY/DECOMPRESSION  MICRODISCECTOMY Bilateral 12/14/2012   Procedure: Bilateral lumbar three-four, four-five decompressive laminotomy/foraminotomy;  Surgeon: Charlie Pitter, MD;  Location: [place] [facility];  Service: Neurosurgery;  Laterality: Bilateral;   PULSE GENERATOR IMPLANT Bilateral 12/13/2013   Procedure: Bilateral implantable pulse generator placement;  Surgeon: Erline Levine, MD;  Location: [place] [facility];  Service: Neurosurgery;  Laterality: Bilateral;  Bilateral implantable pulse generator placement   skin cancer removed     from ears,   12 lft arm  rt leg 15   SUBTHALAMIC STIMULATOR BATTERY REPLACEMENT Bilateral 07/14/2017   Procedure: BILATERAL IMPLANTED PULSE GENERATOR CHANGE FOR DEEP BRAIN STIMULATOR;  Surgeon: Erline Levine, MD;  Location: [facility];  Service: Neurosurgery;  Laterality: Bilateral;   SUBTHALAMIC STIMULATOR INSERTION Bilateral 12/06/2013   Procedure: SUBTHALAMIC STIMULATOR INSERTION;  Surgeon: Erline Levine, MD;  Location: [place] [facility];  Service: Neurosurgery;  Laterality: Bilateral;  Bilateral deep brain stimulator placement    Vitals:   10/23/19 1019  BP: 128/67  Pulse: (!) 51  SpO2: 99%   Upon standing: BP dropped 116/67  Subjective Assessment - 10/23/19 1019    Subjective Patient reports his BP has been elevated last several days. he did take BP medication this morning. Also reports continued RLE hip pain; He reports "iffy" balance last few days. Denies any new  falls;    Patient is accompained by: Family member    Pertinent History [name] is a 69 y.o. male with a hx of HTN, bradycardia, Parkinson's s/p deep brain stimulator 11/2013 , HLD, depression, possible seizure disorder, arthritis, depression/PTSD, dysrhythmia, GERD, sleep apnea. Patient was seen by this therapist in years past. Patient had a fall around Halloween of 2020 and admitted for ORIF on 02/18/19, was discharged to SNF and had [facility] around Christmas. Had home health therapy for a month and a half. After that tried to go  to Outpatient Therapy for 3 sessions at [facility] and was not pleased due to how busy and crowded the location was. Fell last week in bathroom, didn't take walker in, falls about 2x month. Is very weak in RLE. Patient has a new lift chair and U drive walking. Has a caregiver 4 days a week.    Limitations Walking;Standing;Sitting;House hold activities;Lifting    How long can you sit comfortably? not limited with a backrest, without a back rest 3 minutes    How long can you stand comfortably? 3-5 minutes with holding on.    How long can you walk comfortably? with U step 6 minutes.    Patient Stated Goals pain reduction of R hip. strength of legs, dance with wife. push lawnmower.    Currently in Pain? Yes    Pain Score 5     Pain Location Hip    Pain Orientation Right    Pain Descriptors / Indicators Aching;Sore    Pain Type Chronic pain    Pain Onset More than a month ago    Pain Frequency Intermittent    Aggravating Factors  weight bearing/prolonged mobility    Pain Relieving Factors rest    Effect of Pain on Daily Activities decreased walking tolerance;    Multiple Pain Sites No    Pain Onset More than a month ago                  Treatment: Warm up on Crosstrainer, level 2 x4 min (Unbilled); NMR: Standingin // bars: cues for sequencing, upright posture, close CGA:    -forward step over orange hurdle #2 x10 reps with 2-1-0 rail assist with cues for increased step length and to improve ankle DF at heel strike;  -side step orange hurdle #2 decrease from BUE support to no UE support x5 reps each direction with increased difficulty stepping to left side with increased LLE knee buckle and posterior loss of balance;  Standing on airex: -alternate toe taps to 4inch step with 2-1-0 rail assist x15 reps; -standing one foot on airex, one foot on 4 inch step:  Unsupported standing x15 sec with cues for weight shift to neutral;  Ball pass side/side x5 reps each  direction -standing on airex, picking up cones on 4 inch step unsupported #3 x2 sets with CGA for safety;  Forward lunges to BOSU with 2-1-0 rail assist x10 reps each LE with mod VCs for proper positioning/exercise technique for improved LE strengthening and motor control;  Weaving around cones #4 x6 sets with rollator with mod VCs for staying close to walker for safety and to increase base of support for better dynamic balance control and safety;   Exercise: Seated LLE LAQ 3# x20 reps with min VCs to increase terminal knee extension for improved motor control and strengthening;    Pt educated throughout session about proper posture and technique with exercises. Improved exercise technique, movement at target joints, use  of target muscles after min to mod verbal, visual, tactile cues. Patient instructed in advanced balance activities to challenge stance control. He does exhibit increased weakness in LLE with slight knee flexion/buckle with increased weight bearing. He often exhibits unilateral weight shift due to weakness and hip pain; Patient would benefit from skilled physical therapy to increase strength, stability, balance, and decrease falls risk to improve quality of life                     PT Education - 10/23/19 1009    Education provided Yes    Education Details exercise technique, positioning, HEP    Person(s) Educated Patient    Methods Explanation;Verbal cues    Comprehension Verbalized understanding;Returned demonstration;Verbal cues required;Need further instruction            PT Short Term Goals - 10/14/19 1304      PT SHORT TERM GOAL #1   Title Pt will perform HEP with family's/aide's supervision, for improved balance, transfers, and gait.    Baseline 5/5: HEP given 6/28: HEP compliant    Time 4    Period Weeks    Status Partially Met    Target Date 09/19/19             PT Long Term Goals - 10/14/19 1305      PT LONG TERM GOAL #1    Title Patient (> 65 years old) will complete five times sit to stand test in < 15 seconds without UE support without LOB indicating an increased LE strength and improved balance.    Baseline 5/5: 19 seconds one posterior LOB 6/28: 12.32 no UE support    Time 8    Period Weeks    Status Achieved      PT LONG TERM GOAL #2   Title Patient will increase 10 meter walk test to >1.22ms as to improve gait speed for better community ambulation and to reduce fall risk.    Baseline 4/4: 0.71 m/s with U step walker 6/28: 0.917 m/s with U step rollator    Time 8    Period Weeks    Status Partially Met    Target Date 12/09/19      PT LONG TERM GOAL #3   Title Patient will increase Berg Balance score by > 6 points (32/56) to demonstrate decreased fall risk during functional activities.    Baseline 5/5: 26/56 6/28: 36/56    Time 8    Period Weeks    Status Achieved      PT LONG TERM GOAL #4   Title Patient will increase FOTO score to equal to or greater than 31/100  to demonstrate statistically significant improvement in mobility and quality of life.    Baseline 5/5: 12/100 6/28: 38%    Time 8    Period Weeks    Status Achieved      PT LONG TERM GOAL #5   Title Patient will increase six minute walk test distance to >1000 for progression to community ambulator and improve gait ability    Baseline 6/28: 750 ft with U step rollator    Time 8    Period Weeks    Status New    Target Date 12/09/19      PT LONG TERM GOAL #6   Title Patient will increase Berg Balance score by > 6 points (42/56) to demonstrate decreased fall risk during functional activities.    Baseline 6/28: 36/56    Time 8  Period Weeks    Status New    Target Date 12/09/19                 Plan - 10/23/19 1108    Clinical Impression Statement Patient motivated and participated well within session. Instructed patient in balance exercise utilizing compliant surface to challenge stance control. Patient exhibits good  motor control and balance with rail assist but exhibits significant unsteadiness requiring min A when unsupported. Patient exhibits increased weakness on LLE. Instructed patient in LLE strengthening. He also required increased cues for increased knee extension in stance for better stance control. He continues to require cues for staying close to walker especially when negotiating turns for better safety awareness. Patient would benefit from additional skilled PT Intervention to improve strength, balance and mobility;    Personal Factors and Comorbidities Age;Comorbidity 3+;Past/Current Experience;Time since onset of injury/illness/exacerbation;Transportation    Comorbidities athritis, bradycardia, cancer, depression, dysrhythmia, GERD, HTN, Parkinson's, PTSD, SOB, sleep apnea    Examination-Activity Limitations Bathing;Bed Mobility;Caring for Others;Bend;Dressing;Lift;Locomotion Level;Reach Overhead;Sit;Squat;Stairs;Stand;Toileting;Transfers    Examination-Participation Restrictions Church;Cleaning;Community Activity;Interpersonal Relationship;Shop;Volunteer;Yard Work;Other    Stability/Clinical Decision Making Unstable/Unpredictable    Rehab Potential Fair    PT Frequency 2x / week    PT Duration 8 weeks    PT Treatment/Interventions ADLs/Self Care Home Management;Aquatic Therapy;Cryotherapy;Electrical Stimulation;Iontophoresis '4mg'$ /ml Dexamethasone;Moist Heat;Ultrasound;Contrast Bath;DME Instruction;Gait training;Stair training;Functional mobility training;Therapeutic activities;Therapeutic exercise;Balance training;Neuromuscular re-education;Patient/family education;Manual techniques;Wheelchair mobility training;Energy conservation;Passive range of motion;Traction;Orthotic Fit/Training;Dry needling;Vestibular;Visual/perceptual remediation/compensation;Taping;Canalith Repostioning    PT Next Visit Plan HEP, balance RLE strength, hip extension    PT Home Exercise Plan [email or web address]    Consulted and Agree with  Plan of Care Patient;Family member/caregiver    Family Member Consulted wife           Patient will benefit from skilled therapeutic intervention in order to improve the following deficits and impairments:  Abnormal gait, Decreased balance, Decreased coordination, Decreased mobility, Impaired tone, Postural dysfunction, Decreased strength, Decreased safety awareness, Improper body mechanics, Impaired flexibility, Decreased activity tolerance, Decreased endurance, Decreased knowledge of precautions, Difficulty walking, Pain, Cardiopulmonary status limiting activity, Decreased range of motion, Impaired perceived functional ability  Visit Diagnosis: Pain in right hip  Unsteadiness on feet  Muscle weakness (generalized)  Other abnormalities of gait and mobility     Problem List Patient Active Problem List   Diagnosis Date Noted   E. coli bacteremia 02/23/2019   Dysphagia 02/23/2019   Aspiration pneumonia ([place]) 02/21/2019   Closed right hip fracture ([place]) 02/16/2019   Rib pain on right side 09/25/2018   Skin sore 12/01/2017   Hematoma 10/18/2017   Fall at home 10/18/2017   REM behavioral disorder 11/02/2016   Radicular pain in right arm 10/09/2016   GERD (gastroesophageal reflux disease) 07/31/2016   Trochanteric bursitis 03/03/2016   Left hand pain 10/30/2015   Fracture, finger, multiple sites 10/30/2015   Colon cancer screening 07/23/2015   Healthcare maintenance 07/23/2015   Gout 02/18/2015   Depression 01/06/2015   Irritation of eyelid 08/20/2014   Dupuytren's contracture 08/20/2014   Cough 07/21/2014   Lumbar stenosis with neurogenic claudication 06/13/2014   Preop cardiovascular exam 05/28/2014   S/P deep brain stimulator placement 05/08/2014   Joint pain 01/22/2014   Medicare annual wellness visit, initial 01/19/2014   Advance care planning 01/19/2014   Parkinson's disease ([place]) 12/13/2013   PTSD (post-traumatic stress disorder)  06/13/2013   Erectile dysfunction 06/13/2013   HLD (hyperlipidemia) 06/13/2013   Hip pain 06/10/2013   Pain in joint, shoulder region 06/10/2013  Right leg swelling 06/03/2013   Essential hypertension 06/03/2013   Bradycardia by electrocardiogram 06/03/2013   Obstructive sleep apnea 03/12/2013   Spinal stenosis, lumbar region, with neurogenic claudication 12/14/2012    Annesha Delgreco PT, DPT 10/23/2019, 11:17 AM  [place] MAIN [facility] SERVICES [place], [place], [address] Phone: [phone]   Fax:  [phone]  Name: [name] MRN: [id number] Date of Birth: Nov 09, 1950

## 2019-10-28 ENCOUNTER — Ambulatory Visit (INDEPENDENT_AMBULATORY_CARE_PROVIDER_SITE_OTHER): Payer: Medicare PPO | Admitting: Psychology

## 2019-10-28 ENCOUNTER — Other Ambulatory Visit: Payer: Self-pay

## 2019-10-28 ENCOUNTER — Ambulatory Visit: Payer: Medicare PPO

## 2019-10-28 DIAGNOSIS — F4323 Adjustment disorder with mixed anxiety and depressed mood: Secondary | ICD-10-CM

## 2019-10-28 DIAGNOSIS — M25551 Pain in right hip: Secondary | ICD-10-CM

## 2019-10-28 DIAGNOSIS — F331 Major depressive disorder, recurrent, moderate: Secondary | ICD-10-CM

## 2019-10-28 DIAGNOSIS — R2681 Unsteadiness on feet: Secondary | ICD-10-CM

## 2019-10-28 DIAGNOSIS — R2689 Other abnormalities of gait and mobility: Secondary | ICD-10-CM | POA: Diagnosis not present

## 2019-10-28 DIAGNOSIS — G20 Parkinson's disease: Secondary | ICD-10-CM | POA: Diagnosis not present

## 2019-10-28 DIAGNOSIS — M6281 Muscle weakness (generalized): Secondary | ICD-10-CM | POA: Diagnosis not present

## 2019-10-28 NOTE — Therapy (Signed)
[place] MAIN [facility] SERVICES [address] [place], [place], [address] Phone: [phone]   Fax:  [phone]  Physical Therapy Treatment  Patient Details  Name: [name] MRN: [id number] Date of Birth: [birth year] Referring Provider (PT): Sharolyn Douglas MD    Encounter Date: 10/28/2019   PT End of Session - 10/28/19 1023    Visit Number 13    Number of Visits 26    Date for PT Re-Evaluation 12/09/19    Authorization Type 3/10 PN 6/28    Authorization Time Period FOTO- PT    PT Start Time 1015    PT Stop Time 1059    PT Time Calculation (min) 44 min    Equipment Utilized During Treatment Gait belt    Activity Tolerance Patient tolerated treatment well;Patient limited by pain    Behavior During Therapy [facility] for tasks assessed/performed           Past Medical History:  Diagnosis Date  . Arthritis   . Bradycardia   . Cancer [facility]) 2013   skin cancer  . Depression    ptsd  . Dysrhythmia    chronic slow heart rate  . GERD (gastroesophageal reflux disease)   . Headache(784.0)    tension headaches non recent  . History of chicken pox   . History of kidney stones    passed  . Hypertension    treated with HCTZ  . Parkinson's disease ([place])    dx'ed 15 years ago  . PTSD (post-traumatic stress disorder)   . Shortness of breath dyspnea   . Sleep apnea    doesn't use C-pap  . Varicose veins     Past Surgical History:  Procedure Laterality Date  . CHOLECYSTECTOMY N/A 10/22/2014   Procedure: LAPAROSCOPIC CHOLECYSTECTOMY WITH INTRAOPERATIVE CHOLANGIOGRAM;  Surgeon: Dia Crawford III, MD;  Location: [facility] ORS;  Service: General;  Laterality: N/A;  . cyst removed      from lip as a child  . INTRAMEDULLARY (IM) NAIL INTERTROCHANTERIC N/A 02/18/2019   Procedure: INTRAMEDULLARY (IM) NAIL INTERTROCHANTRIC, RIGHT,;  Surgeon: Thornton Park, MD;  Location: [facility] ORS;  Service: Orthopedics;  Laterality: N/A;  . LUMBAR LAMINECTOMY/DECOMPRESSION  MICRODISCECTOMY Bilateral 12/14/2012   Procedure: Bilateral lumbar three-four, four-five decompressive laminotomy/foraminotomy;  Surgeon: Charlie Pitter, MD;  Location: [place] NEURO ORS;  Service: Neurosurgery;  Laterality: Bilateral;  . PULSE GENERATOR IMPLANT Bilateral 12/13/2013   Procedure: Bilateral implantable pulse generator placement;  Surgeon: Erline Levine, MD;  Location: [place] NEURO ORS;  Service: Neurosurgery;  Laterality: Bilateral;  Bilateral implantable pulse generator placement  . skin cancer removed     from ears,   12 lft arm  rt leg 15  . SUBTHALAMIC STIMULATOR BATTERY REPLACEMENT Bilateral 07/14/2017   Procedure: BILATERAL IMPLANTED PULSE GENERATOR CHANGE FOR DEEP BRAIN STIMULATOR;  Surgeon: Erline Levine, MD;  Location: [place];  Service: Neurosurgery;  Laterality: Bilateral;  . SUBTHALAMIC STIMULATOR INSERTION Bilateral 12/06/2013   Procedure: SUBTHALAMIC STIMULATOR INSERTION;  Surgeon: Erline Levine, MD;  Location: [place] NEURO ORS;  Service: Neurosurgery;  Laterality: Bilateral;  Bilateral deep brain stimulator placement    There were no vitals filed for this visit.   Subjective Assessment - 10/28/19 1021    Subjective Patient's wife reports he had one fall since last session, cant remember what he was doing. Feels like one leg is longer than the other.    Patient is accompained by: Family member    Pertinent History LAMARI Mcgee is a 69 y.o.  male with a hx of HTN, bradycardia, Parkinson's s/p deep brain stimulator 11/2013 , HLD, depression, possible seizure disorder, arthritis, depression/PTSD, dysrhythmia, GERD, sleep apnea. Patient was seen by this therapist in years past. Patient had a fall around Halloween of 2020 and admitted for ORIF on 02/18/19, was discharged to SNF and had [place] around Christmas. Had home health therapy for a month and a half. After that tried to go to Outpatient Therapy for 3 sessions at [facility] and was not pleased due to how busy and crowded the location was.  Fell last week in bathroom, didn't take walker in, falls about 2x month. Is very weak in RLE. Patient has a new lift chair and U drive walking. Has a caregiver 4 days a week.    Limitations Walking;Standing;Sitting;House hold activities;Lifting    How long can you sit comfortably? not limited with a backrest, without a back rest 3 minutes    How long can you stand comfortably? 3-5 minutes with holding on.    How long can you walk comfortably? with U step 6 minutes.    Patient Stated Goals pain reduction of R hip. strength of legs, dance with wife. push lawnmower.    Currently in Pain? Yes    Pain Score 4     Pain Location Hip    Pain Orientation Right    Pain Descriptors / Indicators Aching    Pain Type Chronic pain    Pain Onset More than a month ago    Pain Frequency Intermittent             Vitals at start of session: BP: 130/69     Treatment: Nustep Lvl 3 RPM> 40 -50 3 minutes.    TherEx  -forward step over orange hurdle #2 x10 reps with 2-1-0 rail assist with cues for increased step length and to improve ankle DF at heel strike;: terminated du eto buckling of L knee resulting in near LOB.   -Supine: Bridge 10x with focus on gluteal squeeze  LAQ with 3lb ankle weight 15x ; 2 sets    March with 3lb ankle weight 10x   SLR with pool noddle between LEs for RLE positionings 10x each side  hooklying BTB  -abduction 15x; each LE  -marching 15x each LE   Seated: Balloon taps reaching inside/outside BPS for pertubations, stability challenge, muscle recruitment, spatial awareness. Cues for upright posture with excessive posterior lean X 6 minutes.  Balloon to knee for march with coordination challenge x 10x each LE Balloon to foot for LAQ with timing challenge x 10 each LE      Pt educated throughout session about proper posture and technique with exercises. Improved exercise technique, movement at target joints, use of target muscles after min to mod verbal, visual,  tactile cues.                       PT Education - 10/28/19 1022    Education provided Yes    Education Details exercise technique, body mechanics    Person(s) Educated Patient    Methods Explanation;Demonstration;Tactile cues;Verbal cues    Comprehension Verbalized understanding;Returned demonstration;Verbal cues required;Tactile cues required            PT Short Term Goals - 10/14/19 1304      PT SHORT TERM GOAL #1   Title Pt will perform HEP with family's/aide's supervision, for improved balance, transfers, and gait.    Baseline 5/5: HEP given 6/28: HEP compliant  Time 4    Period Weeks    Status Partially Met    Target Date 09/19/19             PT Long Term Goals - 10/14/19 1305      PT LONG TERM GOAL #1   Title Patient (> 68 years old) will complete five times sit to stand test in < 15 seconds without UE support without LOB indicating an increased LE strength and improved balance.    Baseline 5/5: 19 seconds one posterior LOB 6/28: 12.32 no UE support    Time 8    Period Weeks    Status Achieved      PT LONG TERM GOAL #2   Title Patient will increase 10 meter walk test to >1.79ms as to improve gait speed for better community ambulation and to reduce fall risk.    Baseline 4/4: 0.71 m/s with U step walker 6/28: 0.917 m/s with U step rollator    Time 8    Period Weeks    Status Partially Met    Target Date 12/09/19      PT LONG TERM GOAL #3   Title Patient will increase Berg Balance score by > 6 points (32/56) to demonstrate decreased fall risk during functional activities.    Baseline 5/5: 26/56 6/28: 36/56    Time 8    Period Weeks    Status Achieved      PT LONG TERM GOAL #4   Title Patient will increase FOTO score to equal to or greater than 31/100  to demonstrate statistically significant improvement in mobility and quality of life.    Baseline 5/5: 12/100 6/28: 38%    Time 8    Period Weeks    Status Achieved      PT LONG TERM  GOAL #5   Title Patient will increase six minute walk test distance to >1000 for progression to community ambulator and improve gait ability    Baseline 6/28: 750 ft with U step rollator    Time 8    Period Weeks    Status New    Target Date 12/09/19      PT LONG TERM GOAL #6   Title Patient will increase Berg Balance score by > 6 points (42/56) to demonstrate decreased fall risk during functional activities.    Baseline 6/28: 36/56    Time 8    Period Weeks    Status New    Target Date 12/09/19                 Plan - 10/28/19 1113    Clinical Impression Statement Weightbearing interventions limited this session due to excessive knee buckling with all tasks Supine and seated interventions tolerated well however patient often shifts weight and crosses midline requiring external cue.  He often exhibits unilateral weight shift due to weakness and hip pain; Patient would benefit from skilled physical therapy to increase strength, stability, balance, and decrease falls risk to improve quality of life    Personal Factors and Comorbidities Age;Comorbidity 3+;Past/Current Experience;Time since onset of injury/illness/exacerbation;Transportation    Comorbidities athritis, bradycardia, cancer, depression, dysrhythmia, GERD, HTN, Parkinson's, PTSD, SOB, sleep apnea    Examination-Activity Limitations Bathing;Bed Mobility;Caring for Others;Bend;Dressing;Lift;Locomotion Level;Reach Overhead;Sit;Squat;Stairs;Stand;Toileting;Transfers    Examination-Participation Restrictions Church;Cleaning;Community Activity;Interpersonal Relationship;Shop;Volunteer;Yard Work;Other    Stability/Clinical Decision Making Unstable/Unpredictable    Rehab Potential Fair    PT Frequency 2x / week    PT Duration 8 weeks    PT Treatment/Interventions ADLs/Self  Care Home Management;Aquatic Therapy;Cryotherapy;Electrical Stimulation;Iontophoresis 49m/ml Dexamethasone;Moist Heat;Ultrasound;Contrast Bath;DME  Instruction;Gait training;Stair training;Functional mobility training;Therapeutic activities;Therapeutic exercise;Balance training;Neuromuscular re-education;Patient/family education;Manual techniques;Wheelchair mobility training;Energy conservation;Passive range of motion;Traction;Orthotic Fit/Training;Dry needling;Vestibular;Visual/perceptual remediation/compensation;Taping;Canalith Repostioning    PT Next Visit Plan HEP, balance RLE strength, hip extension    PT Home Exercise Plan [email or web address]    Consulted and Agree with Plan of Care Patient;Family member/caregiver    Family Member Consulted wife           Patient will benefit from skilled therapeutic intervention in order to improve the following deficits and impairments:  Abnormal gait, Decreased balance, Decreased coordination, Decreased mobility, Impaired tone, Postural dysfunction, Decreased strength, Decreased safety awareness, Improper body mechanics, Impaired flexibility, Decreased activity tolerance, Decreased endurance, Decreased knowledge of precautions, Difficulty walking, Pain, Cardiopulmonary status limiting activity, Decreased range of motion, Impaired perceived functional ability  Visit Diagnosis: Pain in right hip  Unsteadiness on feet  Muscle weakness (generalized)  Other abnormalities of gait and mobility     Problem List Patient Active Problem List   Diagnosis Date Noted  . E. coli bacteremia 02/23/2019  . Dysphagia 02/23/2019  . [place]iration pneumonia ([place] 02/21/2019  . Closed right hip fracture ([place] 02/16/2019  . Rib pain on right side 09/25/2018  . Skin sore 12/01/2017  . Hematoma 10/18/2017  . Fall at home 10/18/2017  . 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 Radicular pain in right arm 10/09/2016  . GERD (gastroesophageal reflux disease) 07/31/2016  . Trochanteric bursitis 03/03/2016  . Left hand pain 10/30/2015  . Fracture, finger, multiple sites 10/30/2015  . Colon cancer screening 07/23/2015  .  Healthcare maintenance 07/23/2015  . Gout 02/18/2015  . Depression 01/06/2015  . Irritation of eyelid 08/20/2014  . Dupuytren's contracture 08/20/2014  . Cough 07/21/2014  . Lumbar stenosis with neurogenic claudication 06/13/2014  . Preop cardiovascular exam 05/28/2014  . S/P deep brain stimulator placement 05/08/2014  . Joint pain 01/22/2014  . Medicare annual wellness visit, initial 01/19/2014  . Advance care planning 01/19/2014  . [place]'s disease [place] 12/13/2013  . PTSD (post-traumatic stress disorder) 06/13/2013  . Erectile dysfunction 06/13/2013  . HLD (hyperlipidemia) 06/13/2013  . Hip pain 06/10/2013  . Pain in joint, shoulder region 06/10/2013  . Right leg swelling 06/03/2013  . Essential hypertension 06/03/2013  . Bradycardia by electrocardiogram 06/03/2013  . Obstructive sleep apnea 03/12/2013  . Spinal stenosis, lumbar region, with neurogenic claudication 12/14/2012   MJanna Arch PT, DPT   10/28/2019, 11:14 AM  [place] [facility]MAIN [facility] [place] [address] [place] [address]Phone: [phone]  Fax:  [phone] Name: TKALEEM SARTWELLMRN: [id number] of Birth: [birth year]

## 2019-10-30 ENCOUNTER — Other Ambulatory Visit: Payer: Self-pay

## 2019-10-30 ENCOUNTER — Ambulatory Visit: Payer: Medicare PPO

## 2019-10-30 DIAGNOSIS — R2681 Unsteadiness on feet: Secondary | ICD-10-CM

## 2019-10-30 DIAGNOSIS — M25551 Pain in right hip: Secondary | ICD-10-CM

## 2019-10-30 DIAGNOSIS — Z79899 Other long term (current) drug therapy: Secondary | ICD-10-CM | POA: Diagnosis not present

## 2019-10-30 DIAGNOSIS — G20 Parkinson's disease: Secondary | ICD-10-CM | POA: Diagnosis not present

## 2019-10-30 DIAGNOSIS — R2689 Other abnormalities of gait and mobility: Secondary | ICD-10-CM | POA: Diagnosis not present

## 2019-10-30 DIAGNOSIS — G894 Chronic pain syndrome: Secondary | ICD-10-CM | POA: Diagnosis not present

## 2019-10-30 DIAGNOSIS — M6281 Muscle weakness (generalized): Secondary | ICD-10-CM | POA: Diagnosis not present

## 2019-10-30 DIAGNOSIS — M5416 Radiculopathy, lumbar region: Secondary | ICD-10-CM | POA: Diagnosis not present

## 2019-10-30 DIAGNOSIS — Z5181 Encounter for therapeutic drug level monitoring: Secondary | ICD-10-CM | POA: Diagnosis not present

## 2019-10-30 NOTE — Therapy (Signed)
[place] MAIN [facility] SERVICES [address] [place], [place], [address] Phone: [phone]   Fax:  [phone]  Physical Therapy Treatment  Patient Details  Name: [name] MRN: [id number] Date of Birth: 1950-06-07 Referring Provider (PT): Sharolyn Douglas MD    Encounter Date: 10/30/2019   PT End of Session - 10/30/19 1247    Visit Number 14    Number of Visits 26    Date for PT Re-Evaluation 12/09/19    Authorization Type 4/10 PN 6/28    Authorization Time Period FOTO- PT    PT Start Time 1015    PT Stop Time 1100    PT Time Calculation (min) 45 min    Equipment Utilized During Treatment Gait belt    Activity Tolerance Patient tolerated treatment well;Patient limited by pain    Behavior During Therapy [facility] for tasks assessed/performed           Past Medical History:  Diagnosis Date  . Arthritis   . Bradycardia   . Cancer [facility]) 2013   skin cancer  . Depression    ptsd  . Dysrhythmia    chronic slow heart rate  . GERD (gastroesophageal reflux disease)   . Headache(784.0)    tension headaches non recent  . History of chicken pox   . History of kidney stones    passed  . Hypertension    treated with HCTZ  . Parkinson's disease ([place])    dx'ed 15 years ago  . PTSD (post-traumatic stress disorder)   . Shortness of breath dyspnea   . Sleep apnea    doesn't use C-pap  . Varicose veins     Past Surgical History:  Procedure Laterality Date  . CHOLECYSTECTOMY N/A 10/22/2014   Procedure: LAPAROSCOPIC CHOLECYSTECTOMY WITH INTRAOPERATIVE CHOLANGIOGRAM;  Surgeon: Dia Crawford III, MD;  Location: [facility] ORS;  Service: General;  Laterality: N/A;  . cyst removed      from lip as a child  . INTRAMEDULLARY (IM) NAIL INTERTROCHANTERIC N/A 02/18/2019   Procedure: INTRAMEDULLARY (IM) NAIL INTERTROCHANTRIC, RIGHT,;  Surgeon: Thornton Park, MD;  Location: [facility] ORS;  Service: Orthopedics;  Laterality: N/A;  . LUMBAR LAMINECTOMY/DECOMPRESSION  MICRODISCECTOMY Bilateral 12/14/2012   Procedure: Bilateral lumbar three-four, four-five decompressive laminotomy/foraminotomy;  Surgeon: Charlie Pitter, MD;  Location: [place] NEURO ORS;  Service: Neurosurgery;  Laterality: Bilateral;  . PULSE GENERATOR IMPLANT Bilateral 12/13/2013   Procedure: Bilateral implantable pulse generator placement;  Surgeon: Erline Levine, MD;  Location: [place] NEURO ORS;  Service: Neurosurgery;  Laterality: Bilateral;  Bilateral implantable pulse generator placement  . skin cancer removed     from ears,   12 lft arm  rt leg 15  . SUBTHALAMIC STIMULATOR BATTERY REPLACEMENT Bilateral 07/14/2017   Procedure: BILATERAL IMPLANTED PULSE GENERATOR CHANGE FOR DEEP BRAIN STIMULATOR;  Surgeon: Erline Levine, MD;  Location: [place];  Service: Neurosurgery;  Laterality: Bilateral;  . SUBTHALAMIC STIMULATOR INSERTION Bilateral 12/06/2013   Procedure: SUBTHALAMIC STIMULATOR INSERTION;  Surgeon: Erline Levine, MD;  Location: [place] NEURO ORS;  Service: Neurosurgery;  Laterality: Bilateral;  Bilateral deep brain stimulator placement    There were no vitals filed for this visit.   Subjective Assessment - 10/30/19 1245    Subjective Patient presents with new aide. Reports no falls since last session. Went to the physician for his back and will be getting imaging for it.    Patient is accompained by: Family member    Pertinent History [name] is a 69 y.o.  male with a hx of HTN, bradycardia, Parkinson's s/p deep brain stimulator 11/2013 , HLD, depression, possible seizure disorder, arthritis, depression/PTSD, dysrhythmia, GERD, sleep apnea. Patient was seen by this therapist in years past. Patient had a fall around Halloween of 2020 and admitted for ORIF on 02/18/19, was discharged to SNF and had [place] around Christmas. Had home health therapy for a month and a half. After that tried to go to Outpatient Therapy for 3 sessions at [facility] and was not pleased due to how busy and crowded the location was.  Fell last week in bathroom, didn't take walker in, falls about 2x month. Is very weak in RLE. Patient has a new lift chair and U drive walking. Has a caregiver 4 days a week.    Limitations Walking;Standing;Sitting;House hold activities;Lifting    How long can you sit comfortably? not limited with a backrest, without a back rest 3 minutes    How long can you stand comfortably? 3-5 minutes with holding on.    How long can you walk comfortably? with U step 6 minutes.    Patient Stated Goals pain reduction of R hip. strength of legs, dance with wife. push lawnmower.    Currently in Pain? Yes    Pain Score 3     Pain Location Back    Pain Orientation Right    Pain Descriptors / Indicators Aching    Pain Type Chronic pain    Pain Onset More than a month ago    Pain Frequency Intermittent              Vitals at start of session 134/65 pulse 50      Treatment:  Standing in // bars with CGA and tactile cueing for hip alignment.    6" step  -toe taps 15x each LE, PT foot between patient's feet for widening BOS, BUE support -modified tandem stance 30 second hold each position x2 trials each LE placement  Big step from behind green line, stop on red line then return to behind green line with clap x10 each LE, min A for stabilization.   airex pad: 6" step: airex pad sandwich lateral step up/down 10x each side BUE support, cues for foot placement   airex pad: throw target for pertubation 15x   Therex:  3lb ankle weights in // bars: -high knee marching 4x length of // bars, cues for widening BOS -lateral stepping 4x length of // bars, tactile cue for reduction of hip drop . -standing hip extension 10x each LE  -hamstring curl 10x each LE    Ambulate 40 ft with rollator, cues for upright posture  seated: GTB ER/IR with green ball adduction between knees 10x each LE GTB alternating LAQ around ankles 10x each LE GTB hamstring curl against PT resistance x10 each LE        Pt  educated throughout session about proper posture and technique with exercises. Improved exercise technique, movement at target joints, use of target muscles after min to mod verbal, visual, tactile cues   Patient presents to physical therapy with new aide. Is fatigued due to early morning physician appointment prior to PT session. Frequent leg give with fatigue is noted when patient is challenged with prolonged task performance.  He often exhibits unilateral weight shift due to weakness and hip pain; Patient would benefit from skilled physical therapy to increase strength, stability, balance, and decrease falls risk to improve quality of life  PT Education - 10/30/19 1247  Education provided Yes    Education Details exercise technique, body mechanics    Person(s) Educated Patient    Methods Explanation;Demonstration;Tactile cues;Verbal cues    Comprehension Verbalized understanding;Returned demonstration;Verbal cues required;Tactile cues required            PT Short Term Goals - 10/14/19 1304      PT SHORT TERM GOAL #1   Title Pt will perform HEP with family's/aide's supervision, for improved balance, transfers, and gait.    Baseline 5/5: HEP given 6/28: HEP compliant    Time 4    Period Weeks    Status Partially Met    Target Date 09/19/19             PT Long Term Goals - 10/14/19 1305      PT LONG TERM GOAL #1   Title Patient (> 30 years old) will complete five times sit to stand test in < 15 seconds without UE support without LOB indicating an increased LE strength and improved balance.    Baseline 5/5: 19 seconds one posterior LOB 6/28: 12.32 no UE support    Time 8    Period Weeks    Status Achieved      PT LONG TERM GOAL #2   Title Patient will increase 10 meter walk test to >1.3ms as to improve gait speed for better community ambulation and to reduce fall risk.    Baseline 4/4: 0.71 m/s with U step walker 6/28: 0.917 m/s with U step rollator    Time 8    Period  Weeks    Status Partially Met    Target Date 12/09/19      PT LONG TERM GOAL #3   Title Patient will increase Berg Balance score by > 6 points (32/56) to demonstrate decreased fall risk during functional activities.    Baseline 5/5: 26/56 6/28: 36/56    Time 8    Period Weeks    Status Achieved      PT LONG TERM GOAL #4   Title Patient will increase FOTO score to equal to or greater than 31/100  to demonstrate statistically significant improvement in mobility and quality of life.    Baseline 5/5: 12/100 6/28: 38%    Time 8    Period Weeks    Status Achieved      PT LONG TERM GOAL #5   Title Patient will increase six minute walk test distance to >1000 for progression to community ambulator and improve gait ability    Baseline 6/28: 750 ft with U step rollator    Time 8    Period Weeks    Status New    Target Date 12/09/19      PT LONG TERM GOAL #6   Title Patient will increase Berg Balance score by > 6 points (42/56) to demonstrate decreased fall risk during functional activities.    Baseline 6/28: 36/56    Time 8    Period Weeks    Status New    Target Date 12/09/19                 Plan - 10/30/19 1248    Clinical Impression Statement Patient presents to physical therapy with new aide. Is fatigued due to early morning physician appointment prior to PT session. Frequent leg give with fatigue is noted when patient is challenged with prolonged task performance.  He often exhibits unilateral weight shift due to weakness and hip pain; Patient would benefit from skilled physical therapy  to increase strength, stability, balance, and decrease falls risk to improve quality of life    Personal Factors and Comorbidities Age;Comorbidity 3+;Past/Current Experience;Time since onset of injury/illness/exacerbation;Transportation    Comorbidities athritis, bradycardia, cancer, depression, dysrhythmia, GERD, HTN, Parkinson's, PTSD, SOB, sleep apnea    Examination-Activity Limitations  Bathing;Bed Mobility;Caring for Others;Bend;Dressing;Lift;Locomotion Level;Reach Overhead;Sit;Squat;Stairs;Stand;Toileting;Transfers    Examination-Participation Restrictions Church;Cleaning;Community Activity;Interpersonal Relationship;Shop;Volunteer;Yard Work;Other    Stability/Clinical Decision Making Unstable/Unpredictable    Rehab Potential Fair    PT Frequency 2x / week    PT Duration 8 weeks    PT Treatment/Interventions ADLs/Self Care Home Management;Aquatic Therapy;Cryotherapy;Electrical Stimulation;Iontophoresis 34m/ml Dexamethasone;Moist Heat;Ultrasound;Contrast Bath;DME Instruction;Gait training;Stair training;Functional mobility training;Therapeutic activities;Therapeutic exercise;Balance training;Neuromuscular re-education;Patient/family education;Manual techniques;Wheelchair mobility training;Energy conservation;Passive range of motion;Traction;Orthotic Fit/Training;Dry needling;Vestibular;Visual/perceptual remediation/compensation;Taping;Canalith Repostioning    PT Next Visit Plan HEP, balance RLE strength, hip extension    PT Home Exercise Plan [email or web address]    Consulted and Agree with Plan of Care Patient;Family member/caregiver    Family Member Consulted wife           Patient will benefit from skilled therapeutic intervention in order to improve the following deficits and impairments:  Abnormal gait, Decreased balance, Decreased coordination, Decreased mobility, Impaired tone, Postural dysfunction, Decreased strength, Decreased safety awareness, Improper body mechanics, Impaired flexibility, Decreased activity tolerance, Decreased endurance, Decreased knowledge of precautions, Difficulty walking, Pain, Cardiopulmonary status limiting activity, Decreased range of motion, Impaired perceived functional ability  Visit Diagnosis: Pain in right hip  Unsteadiness on feet  Muscle weakness (generalized)  Other abnormalities of gait and mobility  Parkinson's disease  ([place]     Problem List Patient Active Problem List   Diagnosis Date Noted  . E. coli bacteremia 02/23/2019  . Dysphagia 02/23/2019  . Aspiration pneumonia ([place] 02/21/2019  . Closed right hip fracture ([place] 02/16/2019  . Rib pain on right side 09/25/2018  . Skin sore 12/01/2017  . Hematoma 10/18/2017  . Fall at home 10/18/2017  . 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 Radicular pain in right arm 10/09/2016  . GERD (gastroesophageal reflux disease) 07/31/2016  . Trochanteric bursitis 03/03/2016  . Left hand pain 10/30/2015  . Fracture, finger, multiple sites 10/30/2015  . Colon cancer screening 07/23/2015  . Healthcare maintenance 07/23/2015  . Gout 02/18/2015  . Depression 01/06/2015  . Irritation of eyelid 08/20/2014  . Dupuytren's contracture 08/20/2014  . Cough 07/21/2014  . Lumbar stenosis with neurogenic claudication 06/13/2014  . Preop cardiovascular exam 05/28/2014  . S/P deep brain stimulator placement 05/08/2014  . Joint pain 01/22/2014  . Medicare annual wellness visit, initial 01/19/2014  . Advance care planning 01/19/2014  . Parkinson's disease ([place] 12/13/2013  . PTSD (post-traumatic stress disorder) 06/13/2013  . Erectile dysfunction 06/13/2013  . HLD (hyperlipidemia) 06/13/2013  . Hip pain 06/10/2013  . Pain in joint, shoulder region 06/10/2013  . Right leg swelling 06/03/2013  . Essential hypertension 06/03/2013  . Bradycardia by electrocardiogram 06/03/2013  . Obstructive sleep apnea 03/12/2013  . Spinal stenosis, lumbar region, with neurogenic claudication 12/14/2012   MJanna Arch PT, DPT   10/30/2019, 12:50 PM  [place] [facility]ERV[facility] [address]RC[address] [place] [address]Phone: [phone]  Fax:  [phone] Name: George BARTLESONMRN: [id number] of Birth: [birth year]

## 2019-11-01 DIAGNOSIS — Z9689 Presence of other specified functional implants: Secondary | ICD-10-CM | POA: Diagnosis not present

## 2019-11-01 DIAGNOSIS — G20 Parkinson's disease: Secondary | ICD-10-CM | POA: Diagnosis not present

## 2019-11-01 DIAGNOSIS — G3184 Mild cognitive impairment, so stated: Secondary | ICD-10-CM | POA: Diagnosis not present

## 2019-11-01 DIAGNOSIS — R2689 Other abnormalities of gait and mobility: Secondary | ICD-10-CM | POA: Diagnosis not present

## 2019-11-01 DIAGNOSIS — G4752 REM sleep behavior disorder: Secondary | ICD-10-CM | POA: Diagnosis not present

## 2019-11-04 ENCOUNTER — Other Ambulatory Visit: Payer: Self-pay

## 2019-11-04 ENCOUNTER — Ambulatory Visit: Payer: Medicare PPO

## 2019-11-04 DIAGNOSIS — G20 Parkinson's disease: Secondary | ICD-10-CM | POA: Diagnosis not present

## 2019-11-04 DIAGNOSIS — M6281 Muscle weakness (generalized): Secondary | ICD-10-CM

## 2019-11-04 DIAGNOSIS — M25551 Pain in right hip: Secondary | ICD-10-CM | POA: Diagnosis not present

## 2019-11-04 DIAGNOSIS — R2681 Unsteadiness on feet: Secondary | ICD-10-CM

## 2019-11-04 DIAGNOSIS — R2689 Other abnormalities of gait and mobility: Secondary | ICD-10-CM

## 2019-11-04 NOTE — Therapy (Signed)
[facility] MAIN [facility] SERVICES [address] [place], [place], [address] Phone: [phone]   Fax:  [phone]  Physical Therapy Treatment  Patient Details  Name: [name] MRN: [id number] Date of Birth: 1951/02/17 Referring Provider (PT): George Gaul MD    Encounter Date: 11/04/2019   PT End of Session - 11/04/19 1330    Visit Number 15    Number of Visits 26    Date for PT Re-Evaluation 12/09/19    Authorization Type 5/10 PN 6/28    Authorization Time Period FOTO- PT    PT Start Time 1015    PT Stop Time 1059    PT Time Calculation (min) 44 min    Equipment Utilized During Treatment Gait belt    Activity Tolerance Patient tolerated treatment well;Patient limited by pain    Behavior During Therapy [facility] for tasks assessed/performed           Past Medical History:  Diagnosis Date  . Arthritis   . Bradycardia   . Cancer [facility]) 2013   skin cancer  . Depression    ptsd  . Dysrhythmia    chronic slow heart rate  . GERD (gastroesophageal reflux disease)   . Headache(784.0)    tension headaches non recent  . History of chicken pox   . History of kidney stones    passed  . Hypertension    treated with HCTZ  . Parkinson's disease (HCC)    dx'ed 15 years ago  . PTSD (post-traumatic stress disorder)   . Shortness of breath dyspnea   . Sleep apnea    doesn't use C-pap  . Varicose veins     Past Surgical History:  Procedure Laterality Date  . CHOLECYSTECTOMY N/A 10/22/2014   Procedure: LAPAROSCOPIC CHOLECYSTECTOMY WITH INTRAOPERATIVE CHOLANGIOGRAM;  Surgeon: Tiney Rouge III, MD;  Location: [facility] ORS;  Service: General;  Laterality: N/A;  . cyst removed      from lip as a child  . INTRAMEDULLARY (IM) NAIL INTERTROCHANTERIC N/A 02/18/2019   Procedure: INTRAMEDULLARY (IM) NAIL INTERTROCHANTRIC, RIGHT,;  Surgeon: Juanell Fairly, MD;  Location: [facility] ORS;  Service: Orthopedics;  Laterality: N/A;  . LUMBAR LAMINECTOMY/DECOMPRESSION  MICRODISCECTOMY Bilateral 12/14/2012   Procedure: Bilateral lumbar three-four, four-five decompressive laminotomy/foraminotomy;  Surgeon: Temple Pacini, MD;  Location: [facility];  Service: Neurosurgery;  Laterality: Bilateral;  . PULSE GENERATOR IMPLANT Bilateral 12/13/2013   Procedure: Bilateral implantable pulse generator placement;  Surgeon: Maeola Harman, MD;  Location: [facility];  Service: Neurosurgery;  Laterality: Bilateral;  Bilateral implantable pulse generator placement  . skin cancer removed     from ears,   12 lft arm  rt leg 15  . SUBTHALAMIC STIMULATOR BATTERY REPLACEMENT Bilateral 07/14/2017   Procedure: BILATERAL IMPLANTED PULSE GENERATOR CHANGE FOR DEEP BRAIN STIMULATOR;  Surgeon: Maeola Harman, MD;  Location: [facility] OR;  Service: Neurosurgery;  Laterality: Bilateral;  . SUBTHALAMIC STIMULATOR INSERTION Bilateral 12/06/2013   Procedure: SUBTHALAMIC STIMULATOR INSERTION;  Surgeon: Maeola Harman, MD;  Location: [facility];  Service: Neurosurgery;  Laterality: Bilateral;  Bilateral deep brain stimulator placement    There were no vitals filed for this visit.   Subjective Assessment - 11/04/19 1328    Subjective Patient reports knee pain has been bothering him since yesterday;wasn't able to go out yesterday because of it. No falls or LOB since last session, waiting on imaging for spine.    Patient is accompained by: Family member    Pertinent History George Meddings  Mcgee is a 69 y.o. male with a hx of HTN, bradycardia, Parkinson's s/p deep brain stimulator 11/2013 , HLD, depression, possible seizure disorder, arthritis, depression/PTSD, dysrhythmia, GERD, sleep apnea. Patient was seen by this therapist in years past. Patient had a fall around Halloween of 2020 and admitted for ORIF on 02/18/19, was discharged to SNF and had [place] around Christmas. Had home health therapy for a month and a half. After that tried to go to Outpatient Therapy for 3 sessions at [facility] and was not pleased due to  how busy and crowded the location was. Fell last week in bathroom, didn't take walker in, falls about 2x month. Is very weak in RLE. Patient has a new lift chair and U drive walking. Has a caregiver 4 days a week.    Limitations Walking;Standing;Sitting;House hold activities;Lifting    How long can you sit comfortably? not limited with a backrest, without a back rest 3 minutes    How long can you stand comfortably? 3-5 minutes with holding on.    How long can you walk comfortably? with U step 6 minutes.    Patient Stated Goals pain reduction of R hip. strength of legs, dance with wife. push lawnmower.    Currently in Pain? Yes    Pain Score 6     Pain Location Knee    Pain Orientation Right;Left    Pain Descriptors / Indicators Aching    Pain Type Acute pain    Pain Radiating Towards weightbearing    Pain Onset More than a month ago    Pain Frequency Intermittent                Vitals at start of session: 152/68     Treatment:   Supine: -hamstring lengthening -sciatic nerve glide with leg on PT shoulder 20x  Hooklying -GTB abduction 15x -GTB single limb march 15x, cues for arc of motion 2.5 ankle weight -SLR with opp LE in hooklying 10x with cues for quad contraction -straight leg abduction/adduction 10x each LE  Green swiss ball: -hamstring curl 10x with   -TrA activation 10x 3 second holds  Contract relax against PT resistance 10x each LE  Seated: Balloon taps reaching inside/outside BOS with additional use of LE's for coordination, spatial awareness, sequencing and timing of muscle recruitment x 2 minutes Seated soccer ball kicks 2 minutes with progression to passing to self then kicking back to PT for coordination, widening BOS, spatial awareness and timing of reactions GTB adduction 15x each LE  2.5 ankle weight: LAQ 10x each LE  Patient would benefit from skilled physical therapy to increase strength, stability, balance, and decrease falls risk to improve  quality of life   Patient performed nonweightbearing interventions this session due to c/o of knee pain in standing limiting session. Progression of coordination and spatial awareness for carryover to stability performed with patient tolerating well. Patient would benefit from skilled physical therapy to increase strength, stability, balance, and decrease falls risk to improve quality of life                      PT Education - 11/04/19 1329    Education provided Yes    Education Details coordination, exercise technique    Person(s) Educated Patient    Methods Explanation;Demonstration;Tactile cues;Verbal cues    Comprehension Verbalized understanding;Returned demonstration;Verbal cues required;Tactile cues required            PT Short Term Goals - 10/14/19 1304  PT SHORT TERM GOAL #1   Title Pt will perform HEP with family's/aide's supervision, for improved balance, transfers, and gait.    Baseline 5/5: HEP given 6/28: HEP compliant    Time 4    Period Weeks    Status Partially Met    Target Date 09/19/19             PT Long Term Goals - 10/14/19 1305      PT LONG TERM GOAL #1   Title Patient (> 32 years old) will complete five times sit to stand test in < 15 seconds without UE support without LOB indicating an increased LE strength and improved balance.    Baseline 5/5: 19 seconds one posterior LOB 6/28: 12.32 no UE support    Time 8    Period Weeks    Status Achieved      PT LONG TERM GOAL #2   Title Patient will increase 10 meter walk test to >1.43ms as to improve gait speed for better community ambulation and to reduce fall risk.    Baseline 4/4: 0.71 m/s with U step walker 6/28: 0.917 m/s with U step rollator    Time 8    Period Weeks    Status Partially Met    Target Date 12/09/19      PT LONG TERM GOAL #3   Title Patient will increase Berg Balance score by > 6 points (32/56) to demonstrate decreased fall risk during functional  activities.    Baseline 5/5: 26/56 6/28: 36/56    Time 8    Period Weeks    Status Achieved      PT LONG TERM GOAL #4   Title Patient will increase FOTO score to equal to or greater than 31/100  to demonstrate statistically significant improvement in mobility and quality of life.    Baseline 5/5: 12/100 6/28: 38%    Time 8    Period Weeks    Status Achieved      PT LONG TERM GOAL #5   Title Patient will increase six minute walk test distance to >1000 for progression to community ambulator and improve gait ability    Baseline 6/28: 750 ft with U step rollator    Time 8    Period Weeks    Status New    Target Date 12/09/19      PT LONG TERM GOAL #6   Title Patient will increase Berg Balance score by > 6 points (42/56) to demonstrate decreased fall risk during functional activities.    Baseline 6/28: 36/56    Time 8    Period Weeks    Status New    Target Date 12/09/19                 Plan - 11/04/19 1330    Clinical Impression Statement Patient performed nonweightbearing interventions this session due to c/o of knee pain in standing limiting session. Progression of coordination and spatial awareness for carryover to stability performed with patient tolerating well. Patient would benefit from skilled physical therapy to increase strength, stability, balance, and decrease falls risk to improve quality of life    Personal Factors and Comorbidities Age;Comorbidity 3+;Past/Current Experience;Time since onset of injury/illness/exacerbation;Transportation    Comorbidities athritis, bradycardia, cancer, depression, dysrhythmia, GERD, HTN, Parkinson's, PTSD, SOB, sleep apnea    Examination-Activity Limitations Bathing;Bed Mobility;Caring for Others;Bend;Dressing;Lift;Locomotion Level;Reach Overhead;Sit;Squat;Stairs;Stand;Toileting;Transfers    Examination-Participation Restrictions Church;Cleaning;Community Activity;Interpersonal Relationship;Shop;Volunteer;Yard Work;Other     Stability/Clinical Decision Making Unstable/Unpredictable    Rehab  Potential Fair    PT Frequency 2x / week    PT Duration 8 weeks    PT Treatment/Interventions ADLs/Self Care Home Management;Aquatic Therapy;Cryotherapy;Electrical Stimulation;Iontophoresis '4mg'$ /ml Dexamethasone;Moist Heat;Ultrasound;Contrast Bath;DME Instruction;Gait training;Stair training;Functional mobility training;Therapeutic activities;Therapeutic exercise;Balance training;Neuromuscular re-education;Patient/family education;Manual techniques;Wheelchair mobility training;Energy conservation;Passive range of motion;Traction;Orthotic Fit/Training;Dry needling;Vestibular;Visual/perceptual remediation/compensation;Taping;Canalith Repostioning    PT Next Visit Plan HEP, balance RLE strength, hip extension    PT Home Exercise Plan [email or web address]    Consulted and Agree with Plan of Care Patient;Family member/caregiver    Family Member Consulted wife           Patient will benefit from skilled therapeutic intervention in order to improve the following deficits and impairments:  Abnormal gait, Decreased balance, Decreased coordination, Decreased mobility, Impaired tone, Postural dysfunction, Decreased strength, Decreased safety awareness, Improper body mechanics, Impaired flexibility, Decreased activity tolerance, Decreased endurance, Decreased knowledge of precautions, Difficulty walking, Pain, Cardiopulmonary status limiting activity, Decreased range of motion, Impaired perceived functional ability  Visit Diagnosis: Pain in right hip  Unsteadiness on feet  Muscle weakness (generalized)  Other abnormalities of gait and mobility  Parkinson's disease ([place])     Problem List Patient Active Problem List   Diagnosis Date Noted  . E. coli bacteremia 02/23/2019  . Dysphagia 02/23/2019  . Aspiration pneumonia ([place]) 02/21/2019  . Closed right hip fracture ([place]) 02/16/2019  . Rib pain on right side 09/25/2018  . Skin sore 12/01/2017   . Hematoma 10/18/2017  . Fall at home 10/18/2017  . 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 Radicular pain in right arm 10/09/2016  . GERD (gastroesophageal reflux disease) 07/31/2016  . Trochanteric bursitis 03/03/2016  . Left hand pain 10/30/2015  . Fracture, finger, multiple sites 10/30/2015  . Colon cancer screening 07/23/2015  . Healthcare maintenance 07/23/2015  . Gout 02/18/2015  . Depression 01/06/2015  . Irritation of eyelid 08/20/2014  . Dupuytren's contracture 08/20/2014  . Cough 07/21/2014  . Lumbar stenosis with neurogenic claudication 06/13/2014  . Preop cardiovascular exam 05/28/2014  . S/P deep brain stimulator placement 05/08/2014  . Joint pain 01/22/2014  . Medicare annual wellness visit, initial 01/19/2014  . Advance care planning 01/19/2014  . Parkinson's disease ([place]) 12/13/2013  . PTSD (post-traumatic stress disorder) 06/13/2013  . Erectile dysfunction 06/13/2013  . HLD (hyperlipidemia) 06/13/2013  . Hip pain 06/10/2013  . Pain in joint, shoulder region 06/10/2013  . Right leg swelling 06/03/2013  . Essential hypertension 06/03/2013  . Bradycardia by electrocardiogram 06/03/2013  . Obstructive sleep apnea 03/12/2013  . Spinal stenosis, lumbar region, with neurogenic claudication 12/14/2012   Janna Arch, PT, DPT   11/04/2019, 1:31 PM  [place] MAIN [facility] SERVICES [address] [place], [place], [address] Phone: [phone]   Fax:  [phone]  Name: [name] MRN: [id number] Date of Birth: [birth year]

## 2019-11-06 ENCOUNTER — Other Ambulatory Visit: Payer: Self-pay | Admitting: Physical Medicine and Rehabilitation

## 2019-11-06 ENCOUNTER — Ambulatory Visit: Payer: Medicare PPO

## 2019-11-06 DIAGNOSIS — M5416 Radiculopathy, lumbar region: Secondary | ICD-10-CM

## 2019-11-07 DIAGNOSIS — R001 Bradycardia, unspecified: Secondary | ICD-10-CM | POA: Diagnosis not present

## 2019-11-11 ENCOUNTER — Other Ambulatory Visit: Payer: Self-pay

## 2019-11-11 ENCOUNTER — Ambulatory Visit: Payer: Medicare PPO

## 2019-11-11 ENCOUNTER — Ambulatory Visit: Payer: Medicare PPO | Admitting: Psychology

## 2019-11-11 DIAGNOSIS — M25551 Pain in right hip: Secondary | ICD-10-CM

## 2019-11-11 DIAGNOSIS — G20 Parkinson's disease: Secondary | ICD-10-CM | POA: Diagnosis not present

## 2019-11-11 DIAGNOSIS — R2681 Unsteadiness on feet: Secondary | ICD-10-CM

## 2019-11-11 DIAGNOSIS — M6281 Muscle weakness (generalized): Secondary | ICD-10-CM | POA: Diagnosis not present

## 2019-11-11 DIAGNOSIS — R2689 Other abnormalities of gait and mobility: Secondary | ICD-10-CM

## 2019-11-11 NOTE — Therapy (Signed)
[place] MAIN [facility] SERVICES [address] [place], [place], [address] Phone: [phone]   Fax:  [phone]  Physical Therapy Treatment  Patient Details  Name: [name] MRN: [id number] Date of Birth: [birth year] Referring Provider (PT): Sharolyn Douglas MD    Encounter Date: 11/11/2019   PT End of Session - 11/11/19 1336    Visit Number 16    Number of Visits 26    Date for PT Re-Evaluation 12/09/19    Authorization Type 6/10 PN 6/28    Authorization Time Period FOTO- PT    PT Start Time 1015    PT Stop Time 1101    PT Time Calculation (min) 46 min    Equipment Utilized During Treatment Gait belt    Activity Tolerance Patient tolerated treatment well;Patient limited by pain    Behavior During Therapy [facility] for tasks assessed/performed           Past Medical History:  Diagnosis Date  . Arthritis   . Bradycardia   . Cancer [facility]) 2013   skin cancer  . Depression    ptsd  . Dysrhythmia    chronic slow heart rate  . GERD (gastroesophageal reflux disease)   . Headache(784.0)    tension headaches non recent  . History of chicken pox   . History of kidney stones    passed  . Hypertension    treated with HCTZ  . Parkinson's disease ([place])    dx'ed 15 years ago  . PTSD (post-traumatic stress disorder)   . Shortness of breath dyspnea   . Sleep apnea    doesn't use C-pap  . Varicose veins     Past Surgical History:  Procedure Laterality Date  . CHOLECYSTECTOMY N/A 10/22/2014   Procedure: LAPAROSCOPIC CHOLECYSTECTOMY WITH INTRAOPERATIVE CHOLANGIOGRAM;  Surgeon: Dia Crawford III, MD;  Location: [facility] ORS;  Service: General;  Laterality: N/A;  . cyst removed      from lip as a child  . INTRAMEDULLARY (IM) NAIL INTERTROCHANTERIC N/A 02/18/2019   Procedure: INTRAMEDULLARY (IM) NAIL INTERTROCHANTRIC, RIGHT,;  Surgeon: Thornton Park, MD;  Location: [facility] ORS;  Service: Orthopedics;  Laterality: N/A;  . LUMBAR LAMINECTOMY/DECOMPRESSION  MICRODISCECTOMY Bilateral 12/14/2012   Procedure: Bilateral lumbar three-four, four-five decompressive laminotomy/foraminotomy;  Surgeon: Charlie Pitter, MD;  Location: [place] NEURO ORS;  Service: Neurosurgery;  Laterality: Bilateral;  . PULSE GENERATOR IMPLANT Bilateral 12/13/2013   Procedure: Bilateral implantable pulse generator placement;  Surgeon: Erline Levine, MD;  Location: [place] NEURO ORS;  Service: Neurosurgery;  Laterality: Bilateral;  Bilateral implantable pulse generator placement  . skin cancer removed     from ears,   12 lft arm  rt leg 15  . SUBTHALAMIC STIMULATOR BATTERY REPLACEMENT Bilateral 07/14/2017   Procedure: BILATERAL IMPLANTED PULSE GENERATOR CHANGE FOR DEEP BRAIN STIMULATOR;  Surgeon: Erline Levine, MD;  Location: [place];  Service: Neurosurgery;  Laterality: Bilateral;  . SUBTHALAMIC STIMULATOR INSERTION Bilateral 12/06/2013   Procedure: SUBTHALAMIC STIMULATOR INSERTION;  Surgeon: Erline Levine, MD;  Location: [place] NEURO ORS;  Service: Neurosurgery;  Laterality: Bilateral;  Bilateral deep brain stimulator placement    There were no vitals filed for this visit.   Subjective Assessment - 11/11/19 1334    Subjective Back pain has been limiting patient, pain is now a 3/10 but over the weekend was much higher. Had another fall over the weekend when reaching for his fishing stuff in the garage. Reports he didn't hit his head. Is getting a CT scan tomorrow of  his back.    Patient is accompained by: Family member    Pertinent History [name] is a 69 y.o. male with a hx of HTN, bradycardia, Parkinson's s/p deep brain stimulator 11/2013 , HLD, depression, possible seizure disorder, arthritis, depression/PTSD, dysrhythmia, GERD, sleep apnea. Patient was seen by this therapist in years past. Patient had a fall around Halloween of 2020 and admitted for ORIF on 02/18/19, was discharged to SNF and had [place] around Christmas. Had home health therapy for a month and a half. After that tried to go to  Outpatient Therapy for 3 sessions at [facility] and was not pleased due to how busy and crowded the location was. Fell last week in bathroom, didn't take walker in, falls about 2x month. Is very weak in RLE. Patient has a new lift chair and U drive walking. Has a caregiver 4 days a week.    Limitations Walking;Standing;Sitting;House hold activities;Lifting    How long can you sit comfortably? not limited with a backrest, without a back rest 3 minutes    How long can you stand comfortably? 3-5 minutes with holding on.    How long can you walk comfortably? with U step 6 minutes.    Patient Stated Goals pain reduction of R hip. strength of legs, dance with wife. push lawnmower.    Currently in Pain? Yes    Pain Score 3     Pain Location Back    Pain Orientation Lower    Pain Descriptors / Indicators Aching    Pain Type Acute pain    Pain Onset More than a month ago    Pain Frequency Intermittent           Vitals at start of session: 145/66 pulse 47      Supine:  -hamstring lengthening with leg on PT shoulder 60 second holds each LE, popliteal angle 60 second hold each LE -sciatic nerve glide with leg on PT shoulder 20x  -piriformis stretch 60 seconds each LE  -5lb ankle weight:  -SLR 10x each LE, cueing to PT hand for optimal muscle recruitment with opp LE in hooklying  -SAQ over bolster 12x 3 second holds each LE  -marching with posterior pelvic tilt 12x each LE  Seated: 5lb ankle weight  -LAQ 15x each LE, cues for upright posture  -marching with arms crossed 15x each LE  -ER/IR with green ball between knees 12x each LE -Roller to low back x3 minutes for reduction of muscle tissue tension/guarding -balloon taps reaching inside/outside BOS for stabilization, muscle recruitment, reaction timing x 3 minutes utilizing all body parts (UE's , LE's etc) -upright posture outward reach forward touch to floor to upward reach  x10 -lateral reach with step 10x each side visual cue for  arc of reach.   Pt educated throughout session about proper posture and technique with exercises. Improved exercise technique, movement at target joints, use of target muscles after min to mod verbal, visual, tactile cues                       PT Education - 11/11/19 1336    Education provided Yes    Education Details core stability, exercise technique, body mechanics    Person(s) Educated Patient    Methods Explanation;Demonstration;Tactile cues;Verbal cues    Comprehension Need further instruction;Verbalized understanding;Returned demonstration;Verbal cues required;Tactile cues required            PT Short Term Goals - 10/14/19 1304  PT SHORT TERM GOAL #1   Title Pt will perform HEP with family's/aide's supervision, for improved balance, transfers, and gait.    Baseline 5/5: HEP given 6/28: HEP compliant    Time 4    Period Weeks    Status Partially Met    Target Date 09/19/19             PT Long Term Goals - 10/14/19 1305      PT LONG TERM GOAL #1   Title Patient (> 72 years old) will complete five times sit to stand test in < 15 seconds without UE support without LOB indicating an increased LE strength and improved balance.    Baseline 5/5: 19 seconds one posterior LOB 6/28: 12.32 no UE support    Time 8    Period Weeks    Status Achieved      PT LONG TERM GOAL #2   Title Patient will increase 10 meter walk test to >1.18ms as to improve gait speed for better community ambulation and to reduce fall risk.    Baseline 4/4: 0.71 m/s with U step walker 6/28: 0.917 m/s with U step rollator    Time 8    Period Weeks    Status Partially Met    Target Date 12/09/19      PT LONG TERM GOAL #3   Title Patient will increase Berg Balance score by > 6 points (32/56) to demonstrate decreased fall risk during functional activities.    Baseline 5/5: 26/56 6/28: 36/56    Time 8    Period Weeks    Status Achieved      PT LONG TERM GOAL #4   Title Patient  will increase FOTO score to equal to or greater than 31/100  to demonstrate statistically significant improvement in mobility and quality of life.    Baseline 5/5: 12/100 6/28: 38%    Time 8    Period Weeks    Status Achieved      PT LONG TERM GOAL #5   Title Patient will increase six minute walk test distance to >1000 for progression to community ambulator and improve gait ability    Baseline 6/28: 750 ft with U step rollator    Time 8    Period Weeks    Status New    Target Date 12/09/19      PT LONG TERM GOAL #6   Title Patient will increase Berg Balance score by > 6 points (42/56) to demonstrate decreased fall risk during functional activities.    Baseline 6/28: 36/56    Time 8    Period Weeks    Status New    Target Date 12/09/19                 Plan - 11/11/19 1339    Clinical Impression Statement Patient's session limited by pain requiring seated and supine interventions. Core activation and muscle tissue lengthening reduced discomfort in hip and back. Patient challenged with sequencing and body mechanics but improves with repetition, frequent posterior trunk lean requires mod cueing for upright posture. Patient would benefit from skilled physical therapy to increase strength, stability, balance, and decrease falls risk to improve quality of life    Personal Factors and Comorbidities Age;Comorbidity 3+;Past/Current Experience;Time since onset of injury/illness/exacerbation;Transportation    Comorbidities athritis, bradycardia, cancer, depression, dysrhythmia, GERD, HTN, Parkinson's, PTSD, SOB, sleep apnea    Examination-Activity Limitations Bathing;Bed Mobility;Caring for Others;Bend;Dressing;Lift;Locomotion Level;Reach Overhead;Sit;Squat;Stairs;Stand;Toileting;Transfers    Examination-Participation Restrictions Church;Cleaning;Community Activity;Interpersonal Relationship;Shop;Volunteer;[employer]  Stability/Clinical Decision Making Unstable/Unpredictable    Rehab  Potential Fair    PT Frequency 2x / week    PT Duration 8 weeks    PT Treatment/Interventions ADLs/Self Care Home Management;Aquatic Therapy;Cryotherapy;Electrical Stimulation;Iontophoresis '4mg'$ /ml Dexamethasone;Moist Heat;Ultrasound;Contrast Bath;DME Instruction;Gait training;Stair training;Functional mobility training;Therapeutic activities;Therapeutic exercise;Balance training;Neuromuscular re-education;Patient/family education;Manual techniques;Wheelchair mobility training;Energy conservation;Passive range of motion;Traction;Orthotic Fit/Training;Dry needling;Vestibular;Visual/perceptual remediation/compensation;Taping;Canalith Repostioning    PT Next Visit Plan HEP, balance RLE strength, hip extension    PT Home Exercise Plan [email or web address]    Consulted and Agree with Plan of Care Patient;Family member/caregiver    Family Member Consulted wife           Patient will benefit from skilled therapeutic intervention in order to improve the following deficits and impairments:  Abnormal gait, Decreased balance, Decreased coordination, Decreased mobility, Impaired tone, Postural dysfunction, Decreased strength, Decreased safety awareness, Improper body mechanics, Impaired flexibility, Decreased activity tolerance, Decreased endurance, Decreased knowledge of precautions, Difficulty walking, Pain, Cardiopulmonary status limiting activity, Decreased range of motion, Impaired perceived functional ability  Visit Diagnosis: Pain in right hip  Unsteadiness on feet  Muscle weakness (generalized)  Other abnormalities of gait and mobility  Parkinson's disease ([place])     Problem List Patient Active Problem List   Diagnosis Date Noted  . E. coli bacteremia 02/23/2019  . Dysphagia 02/23/2019  . Aspiration pneumonia ([place]) 02/21/2019  . Closed right hip fracture ([place]) 02/16/2019  . Rib pain on right side 09/25/2018  . Skin sore 12/01/2017  . Hematoma 10/18/2017  . Fall at home 10/18/2017  . REM  behavioral disorder 11/02/2016  . Radicular pain in right arm 10/09/2016  . GERD (gastroesophageal reflux disease) 07/31/2016  . Trochanteric bursitis 03/03/2016  . Left hand pain 10/30/2015  . Fracture, finger, multiple sites 10/30/2015  . Colon cancer screening 07/23/2015  . Healthcare maintenance 07/23/2015  . Gout 02/18/2015  . Depression 01/06/2015  . Irritation of eyelid 08/20/2014  . Dupuytren's contracture 08/20/2014  . Cough 07/21/2014  . Lumbar stenosis with neurogenic claudication 06/13/2014  . Preop cardiovascular exam 05/28/2014  . S/P deep brain stimulator placement 05/08/2014  . Joint pain 01/22/2014  . Medicare annual wellness visit, initial 01/19/2014  . Advance care planning 01/19/2014  . Parkinson's disease ([place]) 12/13/2013  . PTSD (post-traumatic stress disorder) 06/13/2013  . Erectile dysfunction 06/13/2013  . HLD (hyperlipidemia) 06/13/2013  . Hip pain 06/10/2013  . Pain in joint, shoulder region 06/10/2013  . Right leg swelling 06/03/2013  . Essential hypertension 06/03/2013  . Bradycardia by electrocardiogram 06/03/2013  . Obstructive sleep apnea 03/12/2013  . Spinal stenosis, lumbar region, with neurogenic claudication 12/14/2012   Janna Arch, PT, DPT   11/11/2019, 1:40 PM  [place] MAIN [facility] SERVICES [address] [place], [place], [address] Phone: [phone]   Fax:  [phone]  Name: DENNIS HEGEMAN MRN: [id number] Date of Birth: 06-02-1950

## 2019-11-12 ENCOUNTER — Other Ambulatory Visit: Payer: Self-pay

## 2019-11-12 ENCOUNTER — Ambulatory Visit
Admission: RE | Admit: 2019-11-12 | Discharge: 2019-11-12 | Disposition: A | Discharge status: Home / Self Care | Payer: Medicare PPO | Source: Ambulatory Visit | Attending: Physical Medicine and Rehabilitation | Admitting: Physical Medicine and Rehabilitation

## 2019-11-12 DIAGNOSIS — M4807 Spinal stenosis, lumbosacral region: Secondary | ICD-10-CM | POA: Diagnosis not present

## 2019-11-12 DIAGNOSIS — M5416 Radiculopathy, lumbar region: Secondary | ICD-10-CM | POA: Diagnosis not present

## 2019-11-12 DIAGNOSIS — M47816 Spondylosis without myelopathy or radiculopathy, lumbar region: Secondary | ICD-10-CM | POA: Diagnosis not present

## 2019-11-12 DIAGNOSIS — M5126 Other intervertebral disc displacement, lumbar region: Secondary | ICD-10-CM | POA: Diagnosis not present

## 2019-11-12 DIAGNOSIS — M48061 Spinal stenosis, lumbar region without neurogenic claudication: Secondary | ICD-10-CM | POA: Diagnosis not present

## 2019-11-13 ENCOUNTER — Ambulatory Visit: Payer: Medicare PPO

## 2019-11-13 DIAGNOSIS — M6281 Muscle weakness (generalized): Secondary | ICD-10-CM | POA: Diagnosis not present

## 2019-11-13 DIAGNOSIS — R2681 Unsteadiness on feet: Secondary | ICD-10-CM | POA: Diagnosis not present

## 2019-11-13 DIAGNOSIS — M25551 Pain in right hip: Secondary | ICD-10-CM | POA: Diagnosis not present

## 2019-11-13 DIAGNOSIS — R2689 Other abnormalities of gait and mobility: Secondary | ICD-10-CM

## 2019-11-13 DIAGNOSIS — G20 Parkinson's disease: Secondary | ICD-10-CM

## 2019-11-13 NOTE — Therapy (Signed)
[place] MAIN [facility] SERVICES [address] [place], [place], [address] Phone: [phone]   Fax:  [phone]  Physical Therapy Treatment  Patient Details  Name: [name] MRN: [id number] Date of Birth: Dec 15, 1950 Referring Provider (PT): George Douglas MD    Encounter Date: 11/13/2019   PT End of Session - 11/13/19 1022    Visit Number 17    Number of Visits 26    Date for PT Re-Evaluation 12/09/19    Authorization Type 7/10 PN 6/28    Authorization Time Period FOTO- PT    PT Start Time 1015    PT Stop Time 1059    PT Time Calculation (min) 44 min    Equipment Utilized During Treatment Gait belt    Activity Tolerance Patient tolerated treatment well;Patient limited by pain    Behavior During Therapy [facility] for tasks assessed/performed           Past Medical History:  Diagnosis Date   Arthritis    Bradycardia    Cancer ([place]) 2013   skin cancer   Depression    ptsd   Dysrhythmia    chronic slow heart rate   GERD (gastroesophageal reflux disease)    Headache(784.0)    tension headaches non recent   History of chicken pox    History of kidney stones    passed   Hypertension    treated with HCTZ   Parkinson's disease ([place])    dx'ed 15 years ago   PTSD (post-traumatic stress disorder)    Shortness of breath dyspnea    Sleep apnea    doesn't use C-pap   Varicose veins     Past Surgical History:  Procedure Laterality Date   CHOLECYSTECTOMY N/A 10/22/2014   Procedure: LAPAROSCOPIC CHOLECYSTECTOMY WITH INTRAOPERATIVE CHOLANGIOGRAM;  Surgeon: George Crawford III, MD;  Location: [facility] ORS;  Service: General;  Laterality: N/A;   cyst removed      from lip as a child   INTRAMEDULLARY (IM) NAIL INTERTROCHANTERIC N/A 02/18/2019   Procedure: INTRAMEDULLARY (IM) NAIL INTERTROCHANTRIC, RIGHT,;  Surgeon: George Park, MD;  Location: [facility] ORS;  Service: Orthopedics;  Laterality: N/A;   LUMBAR LAMINECTOMY/DECOMPRESSION  MICRODISCECTOMY Bilateral 12/14/2012   Procedure: Bilateral lumbar three-four, four-five decompressive laminotomy/foraminotomy;  Surgeon: George Pitter, MD;  Location: [facility];  Service: Neurosurgery;  Laterality: Bilateral;   PULSE GENERATOR IMPLANT Bilateral 12/13/2013   Procedure: Bilateral implantable pulse generator placement;  Surgeon: George Levine, MD;  Location: [facility];  Service: Neurosurgery;  Laterality: Bilateral;  Bilateral implantable pulse generator placement   skin cancer removed     from ears,   12 lft arm  rt leg 15   SUBTHALAMIC STIMULATOR BATTERY REPLACEMENT Bilateral 07/14/2017   Procedure: BILATERAL IMPLANTED PULSE GENERATOR CHANGE FOR DEEP BRAIN STIMULATOR;  Surgeon: George Levine, MD;  Location: [facility];  Service: Neurosurgery;  Laterality: Bilateral;   SUBTHALAMIC STIMULATOR INSERTION Bilateral 12/06/2013   Procedure: SUBTHALAMIC STIMULATOR INSERTION;  Surgeon: George Levine, MD;  Location: [facility];  Service: Neurosurgery;  Laterality: Bilateral;  Bilateral deep brain stimulator placement    There were no vitals filed for this visit.   Subjective Assessment - 11/13/19 1020    Subjective Patient reports his back bothers him most when hes sitting. Had a CT scan yesterday for his low back pain.    Patient is accompained by: Family member    Pertinent History [name] is a 69 y.o. male with a hx of  HTN, bradycardia, Parkinson's s/p deep brain stimulator 11/2013 , HLD, depression, possible seizure disorder, arthritis, depression/PTSD, dysrhythmia, GERD, sleep apnea. Patient was seen by this therapist in years past. Patient had a fall around Halloween of 2020 and admitted for ORIF on 02/18/19, was discharged to SNF and had [place] around Christmas. Had home health therapy for a month and a half. After that tried to go to Outpatient Therapy for 3 sessions at [facility] and was not pleased due to how busy and crowded the location was. Fell last week in bathroom,  didn't take walker in, falls about 2x month. Is very weak in RLE. Patient has a new lift chair and U drive walking. Has a caregiver 4 days a week.    Limitations Walking;Standing;Sitting;House hold activities;Lifting    How long can you sit comfortably? not limited with a backrest, without a back rest 3 minutes    How long can you stand comfortably? 3-5 minutes with holding on.    How long can you walk comfortably? with U step 6 minutes.    Patient Stated Goals pain reduction of R hip. strength of legs, dance with wife. push lawnmower.    Currently in Pain? Yes    Pain Score 3     Pain Location Back    Pain Orientation Lower    Pain Descriptors / Indicators Aching    Pain Onset More than a month ago                 Nustep Lvl 3 RPM> 50 for cardiovascular challenge/support 3 minutes  Standing by stair steps/support surface: airex pad: modified tandem stance one foot on 6" step 2x 45 second holds no UE support  airex pad: SUE support toe taps 15x one leg, then the opp LE,   6" step step up/downs BUE support 10x each LE    2.5 ankle weight -seated soccer ball kicks x3 minutes for coordination, spatial awareness, sequencing/timing of muscle recruitment.  -standing hip extension 10x each LE BUE support -high knee marches standing 12x each LE, cues for upright posture with BUE support -hip abduction standing with cues for reducing crouching posture 12x each LE, BUE support  Sit to stand with weighted ball (2000 gr), cues for forward weight shift and upright stand, min A to reduce posterior trunk lean /reduce LOB x10  Weighted ball (2000 gr) standing chest press 10x, min A for reduction of posterior trunk lean   Heat pad to back for reduction of back pain, seated core contraction 15x      Pt educated throughout session about proper posture and technique with exercises. Improved exercise technique, movement at target joints, use of target muscles after min to mod verbal, visual,  tactile cues               PT Education - 11/13/19 1022    Education provided Yes    Education Details exercise technique, body mechanics, stability    Person(s) Educated Patient    Methods Explanation;Demonstration;Tactile cues;Verbal cues    Comprehension Verbalized understanding;Returned demonstration;Verbal cues required;Tactile cues required            PT Short Term Goals - 10/14/19 1304      PT SHORT TERM GOAL #1   Title Pt will perform HEP with family's/aide's supervision, for improved balance, transfers, and gait.    Baseline 5/5: HEP given 6/28: HEP compliant    Time 4    Period Weeks    Status Partially Met  Target Date 09/19/19             PT Long Term Goals - 10/14/19 1305      PT LONG TERM GOAL #1   Title Patient (> 60 years old) will complete five times sit to stand test in < 15 seconds without UE support without LOB indicating an increased LE strength and improved balance.    Baseline 5/5: 19 seconds one posterior LOB 6/28: 12.32 no UE support    Time 8    Period Weeks    Status Achieved      PT LONG TERM GOAL #2   Title Patient will increase 10 meter walk test to >1.87ms as to improve gait speed for better community ambulation and to reduce fall risk.    Baseline 4/4: 0.71 m/s with U step walker 6/28: 0.917 m/s with U step rollator    Time 8    Period Weeks    Status Partially Met    Target Date 12/09/19      PT LONG TERM GOAL #3   Title Patient will increase Berg Balance score by > 6 points (32/56) to demonstrate decreased fall risk during functional activities.    Baseline 5/5: 26/56 6/28: 36/56    Time 8    Period Weeks    Status Achieved      PT LONG TERM GOAL #4   Title Patient will increase FOTO score to equal to or greater than 31/100  to demonstrate statistically significant improvement in mobility and quality of life.    Baseline 5/5: 12/100 6/28: 38%    Time 8    Period Weeks    Status Achieved      PT LONG TERM GOAL  #5   Title Patient will increase six minute walk test distance to >1000 for progression to community ambulator and improve gait ability    Baseline 6/28: 750 ft with U step rollator    Time 8    Period Weeks    Status New    Target Date 12/09/19      PT LONG TERM GOAL #6   Title Patient will increase Berg Balance score by > 6 points (42/56) to demonstrate decreased fall risk during functional activities.    Baseline 6/28: 36/56    Time 8    Period Weeks    Status New    Target Date 12/09/19                 Plan - 11/14/19 0858    Clinical Impression Statement Patient is challenged with excessive posterior trunk lean this session in standing resulting in near LOB as well as increased back pain. Core and stability interventions tolerated with seated rest breaks. Patient would benefit from skilled physical therapy to increase strength, stability, balance, and decrease falls risk to improve quality of life    Personal Factors and Comorbidities Age;Comorbidity 3+;Past/Current Experience;Time since onset of injury/illness/exacerbation;Transportation    Comorbidities athritis, bradycardia, cancer, depression, dysrhythmia, GERD, HTN, Parkinson's, PTSD, SOB, sleep apnea    Examination-Activity Limitations Bathing;Bed Mobility;Caring for Others;Bend;Dressing;Lift;Locomotion Level;Reach Overhead;Sit;Squat;Stairs;Stand;Toileting;Transfers    Examination-Participation Restrictions Church;Cleaning;Community Activity;Interpersonal Relationship;Shop;Volunteer;Yard Work;Other    Stability/Clinical Decision Making Unstable/Unpredictable    Rehab Potential Fair    PT Frequency 2x / week    PT Duration 8 weeks    PT Treatment/Interventions ADLs/Self Care Home Management;Aquatic Therapy;Cryotherapy;Electrical Stimulation;Iontophoresis 470mml Dexamethasone;Moist Heat;Ultrasound;Contrast Bath;DME Instruction;Gait training;Stair training;Functional mobility training;Therapeutic activities;Therapeutic  exercise;Balance training;Neuromuscular re-education;Patient/family education;Manual techniques;Wheelchair mobility training;Energy conservation;Passive range of motion;Traction;Orthotic Fit/Training;Dry  needling;Vestibular;Visual/perceptual remediation/compensation;Taping;Canalith Repostioning    PT Next Visit Plan HEP, balance RLE strength, hip extension    PT Home Exercise Plan [email or web address]    Consulted and Agree with Plan of Care Patient;Family member/caregiver    Family Member Consulted wife           Patient will benefit from skilled therapeutic intervention in order to improve the following deficits and impairments:  Abnormal gait, Decreased balance, Decreased coordination, Decreased mobility, Impaired tone, Postural dysfunction, Decreased strength, Decreased safety awareness, Improper body mechanics, Impaired flexibility, Decreased activity tolerance, Decreased endurance, Decreased knowledge of precautions, Difficulty walking, Pain, Cardiopulmonary status limiting activity, Decreased range of motion, Impaired perceived functional ability  Visit Diagnosis: Pain in right hip  Unsteadiness on feet  Muscle weakness (generalized)  Other abnormalities of gait and mobility  Parkinson's disease ([place])     Problem List Patient Active Problem List   Diagnosis Date Noted   E. coli bacteremia 02/23/2019   Dysphagia 02/23/2019   Aspiration pneumonia ([place]) 02/21/2019   Closed right hip fracture ([place]) 02/16/2019   Rib pain on right side 09/25/2018   Skin sore 12/01/2017   Hematoma 10/18/2017   Fall at home 10/18/2017   REM behavioral disorder 11/02/2016   Radicular pain in right arm 10/09/2016   GERD (gastroesophageal reflux disease) 07/31/2016   Trochanteric bursitis 03/03/2016   Left hand pain 10/30/2015   Fracture, finger, multiple sites 10/30/2015   Colon cancer screening 07/23/2015   Healthcare maintenance 07/23/2015   Gout 02/18/2015   Depression 01/06/2015    Irritation of eyelid 08/20/2014   Dupuytren's contracture 08/20/2014   Cough 07/21/2014   Lumbar stenosis with neurogenic claudication 06/13/2014   Preop cardiovascular exam 05/28/2014   S/P deep brain stimulator placement 05/08/2014   Joint pain 01/22/2014   Medicare annual wellness visit, initial 01/19/2014   Advance care planning 01/19/2014   Parkinson's disease ([place]) 12/13/2013   PTSD (post-traumatic stress disorder) 06/13/2013   Erectile dysfunction 06/13/2013   HLD (hyperlipidemia) 06/13/2013   Hip pain 06/10/2013   Pain in joint, shoulder region 06/10/2013   Right leg swelling 06/03/2013   Essential hypertension 06/03/2013   Bradycardia by electrocardiogram 06/03/2013   Obstructive sleep apnea 03/12/2013   Spinal stenosis, lumbar region, with neurogenic claudication 12/14/2012   Janna Arch, PT, DPT   11/14/2019, 8:59 AM  [facility] [address] [place], [place], [address] Phone: [phone]   Fax:  [phone]  Name: [name] MRN: [id number] Date of Birth: [birth year]

## 2019-11-14 ENCOUNTER — Ambulatory Visit (INDEPENDENT_AMBULATORY_CARE_PROVIDER_SITE_OTHER): Payer: Medicare PPO

## 2019-11-14 ENCOUNTER — Other Ambulatory Visit: Payer: Self-pay

## 2019-11-14 DIAGNOSIS — R42 Dizziness and giddiness: Secondary | ICD-10-CM | POA: Diagnosis not present

## 2019-11-14 DIAGNOSIS — I951 Orthostatic hypotension: Secondary | ICD-10-CM | POA: Diagnosis not present

## 2019-11-14 DIAGNOSIS — R55 Syncope and collapse: Secondary | ICD-10-CM | POA: Diagnosis not present

## 2019-11-15 LAB — ECHOCARDIOGRAM COMPLETE
AR max vel: 3.08 cm2
AV Area VTI: 3.12 cm2
AV Area mean vel: 3 cm2
AV Mean grad: 4 mmHg
AV Peak grad: 8.6 mmHg
Ao pk vel: 1.47 m/s
Area-P 1/2: 2.62 cm2
S' Lateral: 3.9 cm
Single Plane A4C EF: 59.3 %

## 2019-11-18 ENCOUNTER — Telehealth: Payer: Self-pay | Admitting: *Deleted

## 2019-11-18 ENCOUNTER — Ambulatory Visit: Payer: Medicare PPO | Attending: Neurology

## 2019-11-18 ENCOUNTER — Other Ambulatory Visit: Payer: Self-pay

## 2019-11-18 DIAGNOSIS — G8929 Other chronic pain: Secondary | ICD-10-CM | POA: Insufficient documentation

## 2019-11-18 DIAGNOSIS — R2689 Other abnormalities of gait and mobility: Secondary | ICD-10-CM

## 2019-11-18 DIAGNOSIS — R001 Bradycardia, unspecified: Secondary | ICD-10-CM

## 2019-11-18 DIAGNOSIS — G20 Parkinson's disease: Secondary | ICD-10-CM | POA: Diagnosis not present

## 2019-11-18 DIAGNOSIS — R2681 Unsteadiness on feet: Secondary | ICD-10-CM

## 2019-11-18 DIAGNOSIS — M6281 Muscle weakness (generalized): Secondary | ICD-10-CM

## 2019-11-18 DIAGNOSIS — I10 Essential (primary) hypertension: Secondary | ICD-10-CM

## 2019-11-18 DIAGNOSIS — R278 Other lack of coordination: Secondary | ICD-10-CM | POA: Diagnosis not present

## 2019-11-18 DIAGNOSIS — M25551 Pain in right hip: Secondary | ICD-10-CM

## 2019-11-18 DIAGNOSIS — R29818 Other symptoms and signs involving the nervous system: Secondary | ICD-10-CM | POA: Diagnosis present

## 2019-11-18 DIAGNOSIS — G20A1 Parkinson's disease without dyskinesia, without mention of fluctuations: Secondary | ICD-10-CM

## 2019-11-18 DIAGNOSIS — R55 Syncope and collapse: Secondary | ICD-10-CM

## 2019-11-18 DIAGNOSIS — R262 Difficulty in walking, not elsewhere classified: Secondary | ICD-10-CM | POA: Insufficient documentation

## 2019-11-18 DIAGNOSIS — R296 Repeated falls: Secondary | ICD-10-CM | POA: Diagnosis not present

## 2019-11-18 DIAGNOSIS — M545 Low back pain: Secondary | ICD-10-CM | POA: Insufficient documentation

## 2019-11-18 DIAGNOSIS — R293 Abnormal posture: Secondary | ICD-10-CM | POA: Diagnosis present

## 2019-11-18 MED ORDER — ASPIRIN EC 81 MG PO TBEC
81.0000 mg | DELAYED_RELEASE_TABLET | Freq: Every day | ORAL | 3 refills | Status: AC
Start: 2019-11-18 — End: ?

## 2019-11-18 NOTE — Telephone Encounter (Signed)
Loel Dubonnet, NP  P [facility] Triage Bilateral carotid arteries with mild stenosis (stiffening). Recommend starting Aspirin EC 81mg  daily. Will discuss possible re-initiation of statin at follow up (previously held due to concern for LE weakness).   Of note, he had echo done same day and await report.      Results of echo and carotids called to wife and patient. They agreed to started aspirin 81 mg daily. Med list updated.  Order for repeat echo in 1 year entered.

## 2019-11-18 NOTE — Therapy (Signed)
[place] [facility] SERVICES [address] [place], [place], [address] Phone: [phone]   Fax:  [phone]  Physical Therapy Treatment  Patient Details  Name: [name] MRN: [id number] Date of Birth: [birth year] Referring Provider (PT): Sharolyn Douglas MD    Encounter Date: 11/18/2019   PT End of Session - 11/18/19 1244    Visit Number 18    Number of Visits 26    Date for PT Re-Evaluation 12/09/19    Authorization Type 8/10 PN 6/28    Authorization Time Period FOTO- PT    PT Start Time 1015    PT Stop Time 1059    PT Time Calculation (min) 44 min    Equipment Utilized During Treatment Gait belt    Activity Tolerance Patient tolerated treatment well;Patient limited by pain    Behavior During Therapy [facility] for tasks assessed/performed           Past Medical History:  Diagnosis Date  . Arthritis   . Bradycardia   . Cancer [facility]) 2013   skin cancer  . Depression    ptsd  . Dysrhythmia    chronic slow heart rate  . GERD (gastroesophageal reflux disease)   . Headache(784.0)    tension headaches non recent  . History of chicken pox   . History of kidney stones    passed  . Hypertension    treated with HCTZ  . Parkinson's disease ([place])    dx'ed 15 years ago  . PTSD (post-traumatic stress disorder)   . Shortness of breath dyspnea   . Sleep apnea    doesn't use C-pap  . Varicose veins     Past Surgical History:  Procedure Laterality Date  . CHOLECYSTECTOMY N/A 10/22/2014   Procedure: LAPAROSCOPIC CHOLECYSTECTOMY WITH INTRAOPERATIVE CHOLANGIOGRAM;  Surgeon: Dia Crawford III, MD;  Location: [facility] ORS;  Service: General;  Laterality: N/A;  . cyst removed      from lip as a child  . INTRAMEDULLARY (IM) NAIL INTERTROCHANTERIC N/A 02/18/2019   Procedure: INTRAMEDULLARY (IM) NAIL INTERTROCHANTRIC, RIGHT,;  Surgeon: Thornton Park, MD;  Location: [facility] ORS;  Service: Orthopedics;  Laterality: N/A;  . LUMBAR LAMINECTOMY/DECOMPRESSION  MICRODISCECTOMY Bilateral 12/14/2012   Procedure: Bilateral lumbar three-four, four-five decompressive laminotomy/foraminotomy;  Surgeon: Charlie Pitter, MD;  Location: [place] [facility]O ORS;  Service: Neurosurgery;  Laterality: Bilateral;  . PULSE GENERATOR IMPLANT Bilateral 12/13/2013   Procedure: Bilateral implantable pulse generator placement;  Surgeon: Erline Levine, MD;  Location: [place] [facility]URO ORS;  Service: Neurosurgery;  Laterality: Bilateral;  Bilateral implantable pulse generator placement  . skin cancer removed     from ears,   12 lft arm  rt leg 15  . SUBTHALAMIC STIMULATOR BATTERY REPLACEMENT Bilateral 07/14/2017   Procedure: BILATERAL IMPLANTED PULSE GENERATOR CHANGE FOR DEEP BRAIN STIMULATOR;  Surgeon: Erline Levine, MD;  Location: [place];  Service: Neurosurgery;  Laterality: Bilateral;  . SUBTHALAMIC STIMULATOR INSERTION Bilateral 12/06/2013   Procedure: SUBTHALAMIC STIMULATOR INSERTION;  Surgeon: Erline Levine, MD;  Location: [place] NEURO ORS;  Service: Neurosurgery;  Laterality: Bilateral;  Bilateral deep brain stimulator placement    There were no vitals filed for this visit.   Subjective Assessment - 11/18/19 1018    Subjective Patient reports back pain is affecting his mobility, hurts to stand. Got results back from CT scan with patient stating no major bony complications found.    Patient is accompained by: Family member    Pertinent History [name] is a 69 y.o.  male with a hx of HTN, bradycardia, Parkinson's s/p deep brain stimulator 11/2013 , HLD, depression, possible seizure disorder, arthritis, depression/PTSD, dysrhythmia, GERD, sleep apnea. Patient was seen by this therapist in years past. Patient had a fall around Halloween of 2020 and admitted for ORIF on 02/18/19, was discharged to SNF and had [place] around Christmas. Had home health therapy for a month and a half. After that tried to go to Outpatient Therapy for 3 sessions at [facility] and was not pleased due to how busy and crowded the  location was. Fell last week in bathroom, didn't take walker in, falls about 2x month. Is very weak in RLE. Patient has a new lift chair and U drive walking. Has a caregiver 4 days a week.    Limitations Walking;Standing;Sitting;House hold activities;Lifting    How long can you sit comfortably? not limited with a backrest, without a back rest 3 minutes    How long can you stand comfortably? 3-5 minutes with holding on.    How long can you walk comfortably? with U step 6 minutes.    Patient Stated Goals pain reduction of R hip. strength of legs, dance with wife. push lawnmower.    Currently in Pain? Yes    Pain Score 6     Pain Location Back    Pain Orientation Lower    Pain Descriptors / Indicators Aching    Pain Type Chronic pain    Pain Radiating Towards radidng to R hip    Pain Onset More than a month ago    Pain Frequency Intermittent             Nustep Lvl 3 RPM> 50 for cardiovascular challenge/support 3 minutes    supine:heat under back  SAD with belt inferior glide 3x 30 second holds Supine distraction with figure four position 4x 30 seconds Hamstring lengthening 60 second holds x 2 trials each LE Popliteal angle lengthening 60 secones each LE  Hooklying position  -BTB abduction 20x -posterior pelvic tilt 12x 3 second holds  2.5 ankle weight: -single limb march with overhead weighted ball raise supine 10x each LE -SLR 12x each LE  -straight leg hip abduction 15x each LE  Seated: 2.5 ankle weight LAQ 12x each LE 3 second holds Roller to paraspinals of lumbar spine and R quad x 6 minutes postural stabilization intervention of upright arm raise with palm up, then slap down onto knees. x15   Pt educated throughout session about proper posture and technique with exercises. Improved exercise technique, movement at target joints, use of target muscles after min to mod verbal, visual, tactile cues                   PT Education - 11/18/19 1020     Education provided Yes    Education Details exercise technique, pain reduction.    Person(s) Educated Patient    Methods Explanation;Demonstration;Tactile cues;Verbal cues    Comprehension Verbalized understanding;Returned demonstration;Verbal cues required;Tactile cues required            PT Short Term Goals - 10/14/19 1304      PT SHORT TERM GOAL #1   Title Pt will perform HEP with family's/aide's supervision, for improved balance, transfers, and gait.    Baseline 5/5: HEP given 6/28: HEP compliant    Time 4    Period Weeks    Status Partially Met    Target Date 09/19/19  PT Long Term Goals - 10/14/19 1305      PT LONG TERM GOAL #1   Title Patient (> 40 years old) will complete five times sit to stand test in < 15 seconds without UE support without LOB indicating an increased LE strength and improved balance.    Baseline 5/5: 19 seconds one posterior LOB 6/28: 12.32 no UE support    Time 8    Period Weeks    Status Achieved      PT LONG TERM GOAL #2   Title Patient will increase 10 meter walk test to >1.55ms as to improve gait speed for better community ambulation and to reduce fall risk.    Baseline 4/4: 0.71 m/s with U step walker 6/28: 0.917 m/s with U step rollator    Time 8    Period Weeks    Status Partially Met    Target Date 12/09/19      PT LONG TERM GOAL #3   Title Patient will increase Berg Balance score by > 6 points (32/56) to demonstrate decreased fall risk during functional activities.    Baseline 5/5: 26/56 6/28: 36/56    Time 8    Period Weeks    Status Achieved      PT LONG TERM GOAL #4   Title Patient will increase FOTO score to equal to or greater than 31/100  to demonstrate statistically significant improvement in mobility and quality of life.    Baseline 5/5: 12/100 6/28: 38%    Time 8    Period Weeks    Status Achieved      PT LONG TERM GOAL #5   Title Patient will increase six minute walk test distance to >1000 for  progression to community ambulator and improve gait ability    Baseline 6/28: 750 ft with U step rollator    Time 8    Period Weeks    Status New    Target Date 12/09/19      PT LONG TERM GOAL #6   Title Patient will increase Berg Balance score by > 6 points (42/56) to demonstrate decreased fall risk during functional activities.    Baseline 6/28: 36/56    Time 8    Period Weeks    Status New    Target Date 12/09/19                 Plan - 11/18/19 1246    Clinical Impression Statement Patient's back pain limits his tolerance of interventions this session requiring supine and seated interventions. Distraction of R hip is pain relieving. Posterior trunk lean is pain relieving with anterior trunk lean pain producing which explains increased posterior LOB at home due to preference for posterior trunk lean for pain control. Patient would benefit from skilled physical therapy to increase strength, stability, balance, and decrease falls risk to improve quality of life    Personal Factors and Comorbidities Age;Comorbidity 3+;Past/Current Experience;Time since onset of injury/illness/exacerbation;Transportation    Comorbidities athritis, bradycardia, cancer, depression, dysrhythmia, GERD, HTN, Parkinson's, PTSD, SOB, sleep apnea    Examination-Activity Limitations Bathing;Bed Mobility;Caring for Others;Bend;Dressing;Lift;Locomotion Level;Reach Overhead;Sit;Squat;Stairs;Stand;Toileting;Transfers    Examination-Participation Restrictions Church;Cleaning;Community Activity;Interpersonal Relationship;Shop;Volunteer;Yard Work;Other    Stability/Clinical Decision Making Unstable/Unpredictable    Rehab Potential Fair    PT Frequency 2x / week    PT Duration 8 weeks    PT Treatment/Interventions ADLs/Self Care Home Management;Aquatic Therapy;Cryotherapy;Electrical Stimulation;Iontophoresis '4mg'$ /ml Dexamethasone;Moist Heat;Ultrasound;Contrast Bath;DME Instruction;Gait training;Stair  training;Functional mobility training;Therapeutic activities;Therapeutic exercise;Balance training;Neuromuscular re-education;Patient/family education;Manual techniques;Wheelchair mobility training;Energy  conservation;Passive range of motion;Traction;Orthotic Fit/Training;Dry needling;Vestibular;Visual/perceptual remediation/compensation;Taping;Canalith Repostioning    PT Next Visit Plan HEP, balance RLE strength, hip extension    PT Home Exercise Plan [email or web address]    Consulted and Agree with Plan of Care Patient;Family member/caregiver    Family Member Consulted wife           Patient will benefit from skilled therapeutic intervention in order to improve the following deficits and impairments:  Abnormal gait, Decreased balance, Decreased coordination, Decreased mobility, Impaired tone, Postural dysfunction, Decreased strength, Decreased safety awareness, Improper body mechanics, Impaired flexibility, Decreased activity tolerance, Decreased endurance, Decreased knowledge of precautions, Difficulty walking, Pain, Cardiopulmonary status limiting activity, Decreased range of motion, Impaired perceived functional ability  Visit Diagnosis: Pain in right hip  Unsteadiness on feet  Muscle weakness (generalized)  Other abnormalities of gait and mobility  Parkinson's disease ([place])     Problem List Patient Active Problem List   Diagnosis Date Noted  . E. coli bacteremia 02/23/2019  . Dysphagia 02/23/2019  . Aspiration pneumonia ([place]) 02/21/2019  . Closed right hip fracture ([place]) 02/16/2019  . Rib pain on right side 09/25/2018  . Skin sore 12/01/2017  . Hematoma 10/18/2017  . Fall at home 10/18/2017  . 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 Radicular pain in right arm 10/09/2016  . GERD (gastroesophageal reflux disease) 07/31/2016  . Trochanteric bursitis 03/03/2016  . Left hand pain 10/30/2015  . Fracture, finger, multiple sites 10/30/2015  . Colon cancer screening 07/23/2015  .  Healthcare maintenance 07/23/2015  . Gout 02/18/2015  . Depression 01/06/2015  . Irritation of eyelid 08/20/2014  . Dupuytren's contracture 08/20/2014  . Cough 07/21/2014  . Lumbar stenosis with neurogenic claudication 06/13/2014  . Preop cardiovascular exam 05/28/2014  . S/P deep brain stimulator placement 05/08/2014  . Joint pain 01/22/2014  . Medicare annual wellness visit, initial 01/19/2014  . Advance care planning 01/19/2014  . Parkinson's disease ([place]) 12/13/2013  . PTSD (post-traumatic stress disorder) 06/13/2013  . Erectile dysfunction 06/13/2013  . HLD (hyperlipidemia) 06/13/2013  . Hip pain 06/10/2013  . Pain in joint, shoulder region 06/10/2013  . Right leg swelling 06/03/2013  . Essential hypertension 06/03/2013  . Bradycardia by electrocardiogram 06/03/2013  . Obstructive sleep apnea 03/12/2013  . Spinal stenosis, lumbar region, with neurogenic claudication 12/14/2012   Janna Arch, PT, DPT   11/18/2019, 12:48 PM  [place] [facility]IN [facility] SERVICES [address] [place], [place], [address] Phone: [phone]   Fax:  [phone]  Name: [name] MRN: [id number] Date of Birth: 12-29-1950

## 2019-11-18 NOTE — Telephone Encounter (Signed)
-----   Message from Loel Dubonnet, NP sent at 11/18/2019 11:21 AM EDT ----- Echocardiogram shows normal pumping function. Heart is mildly stiff. No significant valvular abnormalities - only mild leakiness of aortic valve.  Mild dilation of ascending aorta recommend repeat echo in 1 year for monitoring. Overall good result!  Please call carotid duplex results if not already called.

## 2019-11-20 ENCOUNTER — Ambulatory Visit: Payer: Medicare PPO

## 2019-11-20 ENCOUNTER — Other Ambulatory Visit: Payer: Self-pay

## 2019-11-20 DIAGNOSIS — M25551 Pain in right hip: Secondary | ICD-10-CM | POA: Diagnosis not present

## 2019-11-20 DIAGNOSIS — R2689 Other abnormalities of gait and mobility: Secondary | ICD-10-CM

## 2019-11-20 DIAGNOSIS — M545 Low back pain: Secondary | ICD-10-CM | POA: Diagnosis not present

## 2019-11-20 DIAGNOSIS — G20 Parkinson's disease: Secondary | ICD-10-CM | POA: Diagnosis not present

## 2019-11-20 DIAGNOSIS — G8929 Other chronic pain: Secondary | ICD-10-CM | POA: Diagnosis not present

## 2019-11-20 DIAGNOSIS — R278 Other lack of coordination: Secondary | ICD-10-CM | POA: Diagnosis not present

## 2019-11-20 DIAGNOSIS — Z79899 Other long term (current) drug therapy: Secondary | ICD-10-CM | POA: Diagnosis not present

## 2019-11-20 DIAGNOSIS — M6281 Muscle weakness (generalized): Secondary | ICD-10-CM

## 2019-11-20 DIAGNOSIS — G894 Chronic pain syndrome: Secondary | ICD-10-CM | POA: Diagnosis not present

## 2019-11-20 DIAGNOSIS — M5416 Radiculopathy, lumbar region: Secondary | ICD-10-CM | POA: Diagnosis not present

## 2019-11-20 DIAGNOSIS — R2681 Unsteadiness on feet: Secondary | ICD-10-CM

## 2019-11-20 DIAGNOSIS — R296 Repeated falls: Secondary | ICD-10-CM | POA: Diagnosis not present

## 2019-11-20 NOTE — Therapy (Signed)
[place] MAIN [facility] SERVICES [address] [place], [place], [address] Phone: [phone]   Fax:  [phone]  Physical Therapy Treatment  Patient Details  Name: [name] MRN: [id number] Date of Birth: [birth year] Referring Provider (PT): Sharolyn Douglas MD    Encounter Date: 11/20/2019   PT End of Session - 11/20/19 1113    Visit Number 19    Number of Visits 26    Date for PT Re-Evaluation 12/09/19    Authorization Type 9/10 PN 6/28    Authorization Time Period FOTO- PT    PT Start Time 1016    PT Stop Time 1100    PT Time Calculation (min) 44 min    Equipment Utilized During Treatment Gait belt    Activity Tolerance Patient tolerated treatment well;Patient limited by pain;Other (comment)    Behavior During Therapy WFL for tasks assessed/performed           Past Medical History:  Diagnosis Date   Arthritis    Bradycardia    Cancer ([place]) 2013   skin cancer   Depression    ptsd   Dysrhythmia    chronic slow heart rate   GERD (gastroesophageal reflux disease)    Headache(784.0)    tension headaches non recent   History of chicken pox    History of kidney stones    passed   Hypertension    treated with HCTZ   Parkinson's disease ([place])    dx'ed 15 years ago   PTSD (post-traumatic stress disorder)    Shortness of breath dyspnea    Sleep apnea    doesn't use C-pap   Varicose veins     Past Surgical History:  Procedure Laterality Date   CHOLECYSTECTOMY N/A 10/22/2014   Procedure: LAPAROSCOPIC CHOLECYSTECTOMY WITH INTRAOPERATIVE CHOLANGIOGRAM;  Surgeon: Dia Crawford III, MD;  Location: [facility] ORS;  Service: General;  Laterality: N/A;   cyst removed      from lip as a child   INTRAMEDULLARY (IM) NAIL INTERTROCHANTERIC N/A 02/18/2019   Procedure: INTRAMEDULLARY (IM) NAIL INTERTROCHANTRIC, RIGHT,;  Surgeon: Thornton Park, MD;  Location: [facility] ORS;  Service: Orthopedics;  Laterality: N/A;   LUMBAR  LAMINECTOMY/DECOMPRESSION MICRODISCECTOMY Bilateral 12/14/2012   Procedure: Bilateral lumbar three-four, four-five decompressive laminotomy/foraminotomy;  Surgeon: Charlie Pitter, MD;  Location: [place] NEURO ORS;  Service: Neurosurgery;  Laterality: Bilateral;   PULSE GENERATOR IMPLANT Bilateral 12/13/2013   Procedure: Bilateral implantable pulse generator placement;  Surgeon: Erline Levine, MD;  Location: [place] NEURO ORS;  Service: Neurosurgery;  Laterality: Bilateral;  Bilateral implantable pulse generator placement   skin cancer removed     from ears,   12 lft arm  rt leg 15   SUBTHALAMIC STIMULATOR BATTERY REPLACEMENT Bilateral 07/14/2017   Procedure: BILATERAL IMPLANTED PULSE GENERATOR CHANGE FOR DEEP BRAIN STIMULATOR;  Surgeon: Erline Levine, MD;  Location: [place];  Service: Neurosurgery;  Laterality: Bilateral;   SUBTHALAMIC STIMULATOR INSERTION Bilateral 12/06/2013   Procedure: SUBTHALAMIC STIMULATOR INSERTION;  Surgeon: Erline Levine, MD;  Location: [place] NEURO ORS;  Service: Neurosurgery;  Laterality: Bilateral;  Bilateral deep brain stimulator placement    There were no vitals filed for this visit.   Subjective Assessment - 11/20/19 1111    Subjective Patient has referral for back, wants to wait until Monday when his wife can be present for the evaluation.    Patient is accompained by: Family member    Pertinent History [name] is a 69 y.o. male with a hx of  HTN, bradycardia, Parkinson's s/p deep brain stimulator 11/2013 , HLD, depression, possible seizure disorder, arthritis, depression/PTSD, dysrhythmia, GERD, sleep apnea. Patient was seen by this therapist in years past. Patient had a fall around Halloween of 2020 and admitted for ORIF on 02/18/19, was discharged to SNF and had [facility] around Christmas. Had home health therapy for a month and a half. After that tried to go to Outpatient Therapy for 3 sessions at [facility] and was not pleased due to how busy and crowded the location was. Fell  last week in bathroom, didn't take walker in, falls about 2x month. Is very weak in RLE. Patient has a new lift chair and U drive walking. Has a caregiver 4 days a week.    Limitations Walking;Standing;Sitting;House hold activities;Lifting    How long can you sit comfortably? not limited with a backrest, without a back rest 3 minutes    How long can you stand comfortably? 3-5 minutes with holding on.    How long can you walk comfortably? with U step 6 minutes.    Patient Stated Goals pain reduction of R hip. strength of legs, dance with wife. push lawnmower.    Currently in Pain? Yes    Pain Score 5     Pain Location Back    Pain Orientation Lower    Pain Descriptors / Indicators Aching    Pain Type Chronic pain    Pain Onset More than a month ago    Pain Frequency Intermittent            pain 5/10      In // bars:    airex pad: one foot on airex pad one foot on 6" step 60 second holds x 2 trials each LE    Big step in // bars, clap then step back, use of therabands for lines for increasing BOS and length of step 10x each LE.      Static standing posture correction and control 60 seconds with cues of "tall" tactile cues to knees to decrease crouch posture, shoulders for upright posture  X 4 trials   Ambulate forward length of // bars then backwards with 2x4 between feet x 8 trials with increased BOS with repetition.   Ambulate >150 ft with Parkinson's walker with CGA and cues for body mechanics/sequencing to safely return patient to their car after GI issues during session.   Bathroom mobility and clean up due to episode of GI distress and diarrhea.     Pt educated throughout session about proper posture and technique with exercises. Improved exercise technique, movement at target joints, use of target muscles after min to mod verbal, visual, tactile cues.                    PT Education - 11/20/19 1112    Education provided Yes    Education Details need  to hydrate after diarrhea, stabilization, widening BOS    Person(s) Educated Patient    Methods Explanation;Demonstration;Tactile cues;Verbal cues    Comprehension Verbalized understanding;Returned demonstration;Verbal cues required;Tactile cues required            PT Short Term Goals - 10/14/19 1304      PT SHORT TERM GOAL #1   Title Pt will perform HEP with family's/aide's supervision, for improved balance, transfers, and gait.    Baseline 5/5: HEP given 6/28: HEP compliant    Time 4    Period Weeks    Status Partially Met    Target  Date 09/19/19             PT Long Term Goals - 10/14/19 1305      PT LONG TERM GOAL #1   Title Patient (> 48 years old) will complete five times sit to stand test in < 15 seconds without UE support without LOB indicating an increased LE strength and improved balance.    Baseline 5/5: 19 seconds one posterior LOB 6/28: 12.32 no UE support    Time 8    Period Weeks    Status Achieved      PT LONG TERM GOAL #2   Title Patient will increase 10 meter walk test to >1.26ms as to improve gait speed for better community ambulation and to reduce fall risk.    Baseline 4/4: 0.71 m/s with U step walker 6/28: 0.917 m/s with U step rollator    Time 8    Period Weeks    Status Partially Met    Target Date 12/09/19      PT LONG TERM GOAL #3   Title Patient will increase Berg Balance score by > 6 points (32/56) to demonstrate decreased fall risk during functional activities.    Baseline 5/5: 26/56 6/28: 36/56    Time 8    Period Weeks    Status Achieved      PT LONG TERM GOAL #4   Title Patient will increase FOTO score to equal to or greater than 31/100  to demonstrate statistically significant improvement in mobility and quality of life.    Baseline 5/5: 12/100 6/28: 38%    Time 8    Period Weeks    Status Achieved      PT LONG TERM GOAL #5   Title Patient will increase six minute walk test distance to >1000 for progression to community ambulator  and improve gait ability    Baseline 6/28: 750 ft with U step rollator    Time 8    Period Weeks    Status New    Target Date 12/09/19      PT LONG TERM GOAL #6   Title Patient will increase Berg Balance score by > 6 points (42/56) to demonstrate decreased fall risk during functional activities.    Baseline 6/28: 36/56    Time 8    Period Weeks    Status New    Target Date 12/09/19                 Plan - 11/20/19 1115    Clinical Impression Statement Patient's session limited by episodes of GI upset/diarrhea resulting in fatigue and need to return home for patient. Wife aware of patient being sent home. Patient tolerated standing and balance interventions well with no back pain increase. Base of support improved with visual/tactile cue of 2x4 between feet. Patient would benefit from skilled physical therapy to increase strength, stability, balance, and decrease falls risk to improve quality of life    Personal Factors and Comorbidities Age;Comorbidity 3+;Past/Current Experience;Time since onset of injury/illness/exacerbation;Transportation    Comorbidities athritis, bradycardia, cancer, depression, dysrhythmia, GERD, HTN, Parkinson's, PTSD, SOB, sleep apnea    Examination-Activity Limitations Bathing;Bed Mobility;Caring for Others;Bend;Dressing;Lift;Locomotion Level;Reach Overhead;Sit;Squat;Stairs;Stand;Toileting;Transfers    Examination-Participation Restrictions Church;Cleaning;Community Activity;Interpersonal Relationship;Shop;Volunteer;Yard Work;Other    Stability/Clinical Decision Making Unstable/Unpredictable    Rehab Potential Fair    PT Frequency 2x / week    PT Duration 8 weeks    PT Treatment/Interventions ADLs/Self Care Home Management;Aquatic Therapy;Cryotherapy;Electrical Stimulation;Iontophoresis 432mml Dexamethasone;Moist Heat;Ultrasound;Contrast Bath;DME Instruction;Gait training;Stair training;Functional  mobility training;Therapeutic activities;Therapeutic  exercise;Balance training;Neuromuscular re-education;Patient/family education;Manual techniques;Wheelchair mobility training;Energy conservation;Passive range of motion;Traction;Orthotic Fit/Training;Dry needling;Vestibular;Visual/perceptual remediation/compensation;Taping;Canalith Repostioning    PT Next Visit Plan HEP, balance RLE strength, hip extension    PT Home Exercise Plan [email or web address]    Consulted and Agree with Plan of Care Patient;Family member/caregiver    Family Member Consulted wife           Patient will benefit from skilled therapeutic intervention in order to improve the following deficits and impairments:  Abnormal gait, Decreased balance, Decreased coordination, Decreased mobility, Impaired tone, Postural dysfunction, Decreased strength, Decreased safety awareness, Improper body mechanics, Impaired flexibility, Decreased activity tolerance, Decreased endurance, Decreased knowledge of precautions, Difficulty walking, Pain, Cardiopulmonary status limiting activity, Decreased range of motion, Impaired perceived functional ability  Visit Diagnosis: Pain in right hip  Unsteadiness on feet  Muscle weakness (generalized)  Other abnormalities of gait and mobility     Problem List Patient Active Problem List   Diagnosis Date Noted   E. coli bacteremia 02/23/2019   Dysphagia 02/23/2019   Aspiration pneumonia ([place]) 02/21/2019   Closed right hip fracture ([place]) 02/16/2019   Rib pain on right side 09/25/2018   Skin sore 12/01/2017   Hematoma 10/18/2017   Fall at home 10/18/2017   REM behavioral disorder 11/02/2016   Radicular pain in right arm 10/09/2016   GERD (gastroesophageal reflux disease) 07/31/2016   Trochanteric bursitis 03/03/2016   Left hand pain 10/30/2015   Fracture, finger, multiple sites 10/30/2015   Colon cancer screening 07/23/2015   Healthcare maintenance 07/23/2015   Gout 02/18/2015   Depression 01/06/2015   Irritation of eyelid  08/20/2014   Dupuytren's contracture 08/20/2014   Cough 07/21/2014   Lumbar stenosis with neurogenic claudication 06/13/2014   Preop cardiovascular exam 05/28/2014   S/P deep brain stimulator placement 05/08/2014   Joint pain 01/22/2014   Medicare annual wellness visit, initial 01/19/2014   Advance care planning 01/19/2014   Parkinson's disease ([place]) 12/13/2013   PTSD (post-traumatic stress disorder) 06/13/2013   Erectile dysfunction 06/13/2013   HLD (hyperlipidemia) 06/13/2013   Hip pain 06/10/2013   Pain in joint, shoulder region 06/10/2013   Right leg swelling 06/03/2013   Essential hypertension 06/03/2013   Bradycardia by electrocardiogram 06/03/2013   Obstructive sleep apnea 03/12/2013   Spinal stenosis, lumbar region, with neurogenic claudication 12/14/2012   Janna Arch, PT, DPT   11/20/2019, 11:17 AM  [facility] [address] [place], [place], [address] Phone: [phone]   Fax:  [phone]  Name: DAMARE SERANO MRN: [id number] Date of Birth: Apr 21, 1950

## 2019-11-21 ENCOUNTER — Ambulatory Visit (INDEPENDENT_AMBULATORY_CARE_PROVIDER_SITE_OTHER): Payer: Medicare PPO

## 2019-11-21 ENCOUNTER — Ambulatory Visit
Admission: EM | Admit: 2019-11-21 | Discharge: 2019-11-21 | Disposition: A | Discharge status: Home / Self Care | Payer: Medicare PPO | Attending: Emergency Medicine | Admitting: Emergency Medicine

## 2019-11-21 ENCOUNTER — Other Ambulatory Visit: Payer: Self-pay

## 2019-11-21 DIAGNOSIS — S40012A Contusion of left shoulder, initial encounter: Secondary | ICD-10-CM

## 2019-11-21 DIAGNOSIS — S20229A Contusion of unspecified back wall of thorax, initial encounter: Secondary | ICD-10-CM | POA: Diagnosis not present

## 2019-11-21 DIAGNOSIS — M47817 Spondylosis without myelopathy or radiculopathy, lumbosacral region: Secondary | ICD-10-CM | POA: Diagnosis not present

## 2019-11-21 DIAGNOSIS — W19XXXA Unspecified fall, initial encounter: Secondary | ICD-10-CM | POA: Diagnosis not present

## 2019-11-21 DIAGNOSIS — M2578 Osteophyte, vertebrae: Secondary | ICD-10-CM | POA: Diagnosis not present

## 2019-11-21 DIAGNOSIS — M533 Sacrococcygeal disorders, not elsewhere classified: Secondary | ICD-10-CM | POA: Diagnosis not present

## 2019-11-21 DIAGNOSIS — S3992XA Unspecified injury of lower back, initial encounter: Secondary | ICD-10-CM | POA: Diagnosis not present

## 2019-11-21 DIAGNOSIS — S4992XA Unspecified injury of left shoulder and upper arm, initial encounter: Secondary | ICD-10-CM | POA: Diagnosis not present

## 2019-11-21 NOTE — ED Provider Notes (Signed)
HPI  SUBJECTIVE:  [name] is a 69 y.o. male who presents with left shoulder pain and "tailbone" pain after having a fall from standing 1 hour prior to arrival.  States that his shoe got stuck on the floor and he fell to the left.  He denies preceding chest pain, shortness of breath, palpitations or syncope causing his fall.  He denies hitting his head.  States that his chest, back, abdomen are without injury.  He describes the shoulder pain as sharp, constant.  He reports tingling in his hand.  He states it feels similar to previous bone fractures, but he is able to move his shoulder through full range of motion.   No bruising, distal numbness.  No aggravating or alleviating factors.  He has not tried anything for this.  He also describes similar pain in his lower back/tailbone.  States that he fell on it directly.  He has a past medical history of Parkinson's.  No history of osteoporosis, diabetes, hypertension, chronic kidney disease, peptic ulcer disease, GI bleed.  ZOX:WRUEAV, Elveria Rising, MD   Past Medical History:  Diagnosis Date  . Arthritis   . Bradycardia   . Cancer [facility]) 2013   skin cancer  . Depression    ptsd  . Dysrhythmia    chronic slow heart rate  . GERD (gastroesophageal reflux disease)   . Headache(784.0)    tension headaches non recent  . History of chicken pox   . History of kidney stones    passed  . Hypertension    treated with HCTZ  . Parkinson's disease ([place])    dx'ed 15 years ago  . PTSD (post-traumatic stress disorder)   . Shortness of breath dyspnea   . Sleep apnea    doesn't use C-pap  . Varicose veins     Past Surgical History:  Procedure Laterality Date  . CHOLECYSTECTOMY N/A 10/22/2014   Procedure: LAPAROSCOPIC CHOLECYSTECTOMY WITH INTRAOPERATIVE CHOLANGIOGRAM;  Surgeon: Dia Crawford III, MD;  Location: [facility] ORS;  Service: General;  Laterality: N/A;  . cyst removed      from lip as a child  . INTRAMEDULLARY (IM) NAIL INTERTROCHANTERIC N/A  02/18/2019   Procedure: INTRAMEDULLARY (IM) NAIL INTERTROCHANTRIC, RIGHT,;  Surgeon: Thornton Park, MD;  Location: [facility] ORS;  Service: Orthopedics;  Laterality: N/A;  . LUMBAR LAMINECTOMY/DECOMPRESSION MICRODISCECTOMY Bilateral 12/14/2012   Procedure: Bilateral lumbar three-four, four-five decompressive laminotomy/foraminotomy;  Surgeon: Charlie Pitter, MD;  Location: [place] NEURO ORS;  Service: Neurosurgery;  Laterality: Bilateral;  . PULSE GENERATOR IMPLANT Bilateral 12/13/2013   Procedure: Bilateral implantable pulse generator placement;  Surgeon: Erline Levine, MD;  Location: [place] NEURO ORS;  Service: Neurosurgery;  Laterality: Bilateral;  Bilateral implantable pulse generator placement  . skin cancer removed     from ears,   12 lft arm  rt leg 15  . SUBTHALAMIC STIMULATOR BATTERY REPLACEMENT Bilateral 07/14/2017   Procedure: BILATERAL IMPLANTED PULSE GENERATOR CHANGE FOR DEEP BRAIN STIMULATOR;  Surgeon: Erline Levine, MD;  Location: [place];  Service: Neurosurgery;  Laterality: Bilateral;  . SUBTHALAMIC STIMULATOR INSERTION Bilateral 12/06/2013   Procedure: SUBTHALAMIC STIMULATOR INSERTION;  Surgeon: Erline Levine, MD;  Location: [place] NEURO ORS;  Service: Neurosurgery;  Laterality: Bilateral;  Bilateral deep brain stimulator placement    Family History  Problem Relation Age of Onset  . Lung cancer Father   . Colon cancer Neg Hx   . Prostate cancer Neg Hx     Social History   Tobacco Use  . Smoking status:  Never Smoker  . Smokeless tobacco: Former Network engineer  . Vaping Use: Never used  Substance Use Topics  . Alcohol use: Yes    Alcohol/week: 0.0 standard drinks    Comment: occasional (twice per month)  . Drug use: No    No current facility-administered medications for this encounter.  Current Outpatient Medications:  .  aspirin EC 81 MG tablet, Take 1 tablet (81 mg total) by mouth daily. Swallow whole., Disp: 90 tablet, Rfl: 3 .  carbidopa-levodopa (SINEMET IR) 25-100 MG tablet, 2  tabs in the AM, 1 midday, 1 in the evening (Patient taking differently: 2 tabs in the AM, 1.5 midday, 1.5 in the evening), Disp: , Rfl:  .  cholecalciferol (VITAMIN D) 1000 UNITS tablet, Take 1,000 Units by mouth daily., Disp: , Rfl:  .  escitalopram (LEXAPRO) 10 MG tablet, TAKE 1 TABLET DAILY, Disp: 90 tablet, Rfl: 3 .  hydrALAZINE (APRESOLINE) 25 MG tablet, Take 1 tablet (25 mg total) by mouth as needed (For elevated blood pressure. Take if you have 2 consecutive readings that are one hour apart with systolic BP >130)., Disp: 30 tablet, Rfl: 0 .  Melatonin 10 MG TABS, Take 10 mg by mouth at bedtime., Disp: , Rfl:  .  memantine (NAMENDA) 10 MG tablet, TAKE 1 TABLET TWICE A DAY, Disp: 180 tablet, Rfl: 3 .  omeprazole (PRILOSEC) 20 MG capsule, TAKE 1 CAPSULE DAILY, Disp: 90 capsule, Rfl: 3 .  rivastigmine (EXELON) 9.5 mg/24hr, 9.5 mg daily., Disp: , Rfl:  .  traMADol (ULTRAM) 50 MG tablet, Take by mouth every 6 (six) hours as needed., Disp: , Rfl:   Allergies  Allergen Reactions  . Clonazepam Other (See Comments) and Photosensitivity    Worsening mood/depression "Over-sedation" per pt.  . Morphine Other (See Comments)  . Morphine And Related Other (See Comments)    hallucinations  . Prednisone Other (See Comments)    Unclear reaction     ROS  As noted in HPI.   Physical Exam  BP (!) 158/78 (BP Location: Right Arm)   Pulse (!) 44   Temp 98.4 F (36.9 C) (Oral)   Resp 16   Ht 5\' 9"  (1.753 m)   Wt 81.6 kg   SpO2 98%   BMI 26.58 kg/m    Constitutional: Well developed, well nourished, no acute distress Eyes:  EOMI, conjunctiva normal bilaterally HENT: Normocephalic, atraumatic,mucus membranes moist Respiratory: Normal inspiratory effort Cardiovascular: Normal rate GI: nondistended skin: No rash, skin intact Musculoskeletal: no deformities L shoulder with ROM normal, Drop test normal,  clavicle NT, A/C joint NT, scapula NT, proximal humerus NT, shoulder joint NT, Motor  strength normal, Sensation intact LT over deltoid region, distal NVI with hand on affected side having intact sensation and strength in the distribution of the median, radial, and ulnar nerve.   no pain with internal rotation, no pain with external rotation,  negative tenderness in bicipital groove, negative empty can test, negative liftoff test, no instability with abduction/external rotation.  Bony tenderness L5/S1. Well healed surgical scar. No paralumbar tenderness. No bruising. Sacrum, coccyx NT.   Neurologic: Alert & oriented x 3, no focal neuro deficits Psychiatric: Speech and behavior appropriate   ED Course   Medications - No data to display  Orders Placed This Encounter  Procedures  . DG Lumbar Spine Complete    Standing Status:   Standing    Number of Occurrences:   1    Order Specific Question:   Reason for  Exam (SYMPTOM  OR DIAGNOSIS REQUIRED)    Answer:   fall  . DG Shoulder Left    Standing Status:   Standing    Number of Occurrences:   1    Order Specific Question:   Reason for Exam (SYMPTOM  OR DIAGNOSIS REQUIRED)    Answer:   fall    No results found. However, due to the size of the patient record, not all encounters were searched. Please check Results Review for a complete set of results. DG Lumbar Spine Complete  Result Date: 11/21/2019 CLINICAL DATA:  Fall with tailbone pain EXAM: LUMBAR SPINE - COMPLETE 4+ VIEW COMPARISON:  10/10/2014, CT 11/12/2018 FINDINGS: Left abdominal calcification measuring up to 13 mm. Post fusion changes at L4-L5 with interbody device, posterior rods and fixating screws. Vertebral body heights are maintained. Bulky osteophytes at T11-T12 and T12-L1. Degenerative changes at multiple levels with moderate disease at L5-S1. IMPRESSION: 1. No acute osseous abnormality. 2. Post fusion changes at L4-L5.  Multiple level degenerative change 3. Left abdominal calcification measuring up to 13 mm, suspect for kidney stone Electronically Signed   By:  Donavan Foil M.D.   On: 11/21/2019 21:02   DG Shoulder Left  Result Date: 11/21/2019 CLINICAL DATA:  Fall with shoulder pain EXAM: LEFT SHOULDER - 2+ VIEW COMPARISON:  12/03/2015 FINDINGS: Left-sided stimulator generator. AC joint is intact. No fracture or malalignment. IMPRESSION: No acute osseous abnormality. Electronically Signed   By: Donavan Foil M.D.   On: 11/21/2019 21:03    ED Clinical Impression  1. Contusion of left shoulder, initial encounter   2. Contusion of back, unspecified laterality, initial encounter   3. Fall, initial encounter      ED Assessment/Plan  Reviewed imaging independently.  No acute changes in the L-spine or shoulder.  See radiology report for full details.  13 mm left abdominal calcification suspicious for kidney stone.  Patient is asymptomatic.  Discussed this finding with patient and spouse.  Home with Tylenol, ibuprofen, ice to the painful areas.  Follow-up with PMD as needed.  Discussed  imaging, MDM, treatment plan, and plan for follow-up with patient and spouse. Discussed sn/sx that should prompt return to the ED. They agree with plan.   No orders of the defined types were placed in this encounter.   *This clinic note was created using Dragon dictation software. Therefore, there may be occasional mistakes despite careful proofreading.   ?    Melynda Ripple, MD 11/22/19 1650

## 2019-11-21 NOTE — Discharge Instructions (Addendum)
Your shoulder x-ray and back x-rays were negative for fracture.  May take 400 mg of ibuprofen with 1000 mg Tylenol 3-4 times a day as needed for pain.  Ice the areas of pain.

## 2019-11-21 NOTE — ED Triage Notes (Signed)
Pt reports about an hour before arrival he fell from standing to the floor, and struck his left shoulder. Having left shoulder pain although able to lift it. Also reports tailbone pain as well

## 2019-11-25 ENCOUNTER — Ambulatory Visit (INDEPENDENT_AMBULATORY_CARE_PROVIDER_SITE_OTHER): Payer: Medicare PPO | Admitting: Psychology

## 2019-11-25 ENCOUNTER — Ambulatory Visit: Payer: Medicare PPO

## 2019-11-25 ENCOUNTER — Other Ambulatory Visit: Payer: Self-pay

## 2019-11-25 DIAGNOSIS — F331 Major depressive disorder, recurrent, moderate: Secondary | ICD-10-CM

## 2019-11-25 DIAGNOSIS — M545 Low back pain, unspecified: Secondary | ICD-10-CM

## 2019-11-25 DIAGNOSIS — G8929 Other chronic pain: Secondary | ICD-10-CM | POA: Diagnosis not present

## 2019-11-25 DIAGNOSIS — M25551 Pain in right hip: Secondary | ICD-10-CM

## 2019-11-25 DIAGNOSIS — R2681 Unsteadiness on feet: Secondary | ICD-10-CM | POA: Diagnosis not present

## 2019-11-25 DIAGNOSIS — F4323 Adjustment disorder with mixed anxiety and depressed mood: Secondary | ICD-10-CM | POA: Diagnosis not present

## 2019-11-25 DIAGNOSIS — R2689 Other abnormalities of gait and mobility: Secondary | ICD-10-CM

## 2019-11-25 DIAGNOSIS — R278 Other lack of coordination: Secondary | ICD-10-CM | POA: Diagnosis not present

## 2019-11-25 DIAGNOSIS — G20 Parkinson's disease: Secondary | ICD-10-CM | POA: Diagnosis not present

## 2019-11-25 DIAGNOSIS — M6281 Muscle weakness (generalized): Secondary | ICD-10-CM | POA: Diagnosis not present

## 2019-11-25 DIAGNOSIS — R296 Repeated falls: Secondary | ICD-10-CM | POA: Diagnosis not present

## 2019-11-25 NOTE — Therapy (Signed)
[place] MAIN [facility] SERVICES [address] [place], [place], [address] Phone: [phone]   Fax:  [phone]  Physical Therapy Treatment/Re-EVAL adding back cert/Physical Therapy Progress Note   Dates of reporting period 10/14/19  to   11/25/19  Patient Details  Name: [name] MRN: [id number] Date of Birth: Dec 21, 1950 Referring Provider (PT): Sharolyn Douglas MD    Encounter Date: 11/25/2019   PT End of Session - 11/25/19 1635    Visit Number 20    Number of Visits 26    Date for PT Re-Evaluation 12/09/19    Authorization Type 10/10 PN 6/28; next session 1/10 PN 11/25/19    Authorization Time Period FOTO- PT    PT Start Time 1015    PT Stop Time 1059    PT Time Calculation (min) 44 min    Equipment Utilized During Treatment Gait belt    Activity Tolerance Patient tolerated treatment well;Patient limited by pain;Other (comment)    Behavior During Therapy WFL for tasks assessed/performed           Past Medical History:  Diagnosis Date  . Arthritis   . Bradycardia   . Cancer [facility]) 2013   skin cancer  . Depression    ptsd  . Dysrhythmia    chronic slow heart rate  . GERD (gastroesophageal reflux disease)   . Headache(784.0)    tension headaches non recent  . History of chicken pox   . History of kidney stones    passed  . Hypertension    treated with HCTZ  . Parkinson's disease ([place])    dx'ed 15 years ago  . PTSD (post-traumatic stress disorder)   . Shortness of breath dyspnea   . Sleep apnea    doesn't use C-pap  . Varicose veins     Past Surgical History:  Procedure Laterality Date  . CHOLECYSTECTOMY N/A 10/22/2014   Procedure: LAPAROSCOPIC CHOLECYSTECTOMY WITH INTRAOPERATIVE CHOLANGIOGRAM;  Surgeon: Dia Crawford III, MD;  Location: [facility] ORS;  Service: General;  Laterality: N/A;  . cyst removed      from lip as a child  . INTRAMEDULLARY (IM) NAIL INTERTROCHANTERIC N/A 02/18/2019   Procedure: INTRAMEDULLARY (IM) NAIL  INTERTROCHANTRIC, RIGHT,;  Surgeon: Thornton Park, MD;  Location: [facility] ORS;  Service: Orthopedics;  Laterality: N/A;  . LUMBAR LAMINECTOMY/DECOMPRESSION MICRODISCECTOMY Bilateral 12/14/2012   Procedure: Bilateral lumbar three-four, four-five decompressive laminotomy/foraminotomy;  Surgeon: Charlie Pitter, MD;  Location: [place] NEURO ORS;  Service: Neurosurgery;  Laterality: Bilateral;  . PULSE GENERATOR IMPLANT Bilateral 12/13/2013   Procedure: Bilateral implantable pulse generator placement;  Surgeon: Erline Levine, MD;  Location: [place] NEURO ORS;  Service: Neurosurgery;  Laterality: Bilateral;  Bilateral implantable pulse generator placement  . skin cancer removed     from ears,   12 lft arm  rt leg 15  . SUBTHALAMIC STIMULATOR BATTERY REPLACEMENT Bilateral 07/14/2017   Procedure: BILATERAL IMPLANTED PULSE GENERATOR CHANGE FOR DEEP BRAIN STIMULATOR;  Surgeon: Erline Levine, MD;  Location: [place];  Service: Neurosurgery;  Laterality: Bilateral;  . SUBTHALAMIC STIMULATOR INSERTION Bilateral 12/06/2013   Procedure: SUBTHALAMIC STIMULATOR INSERTION;  Surgeon: Erline Levine, MD;  Location: [place] NEURO ORS;  Service: Neurosurgery;  Laterality: Bilateral;  Bilateral deep brain stimulator placement    There were no vitals filed for this visit.   Subjective Assessment - 11/25/19 1505    Subjective Patient presents for referral for radiculopathy, lumbar region.Patient's back pain began > 15 years. Goes next week to get an injection. Pain  shoots down bilateral legs on lateral side of leg stopping at knee. Leaning forward hurts more.    Patient is accompained by: Family member    Pertinent History [name] is a 69 y.o. male with a hx of HTN, bradycardia, Parkinson's s/p deep brain stimulator 11/2013 , HLD, depression, possible seizure disorder, arthritis, depression/PTSD, dysrhythmia, GERD, sleep apnea. Patient was seen by this therapist in years past. Patient had a fall around Halloween of 2020 and admitted for ORIF  on 02/18/19, was discharged to SNF and had COVID around Christmas. Had home health therapy for a month and a half. After that tried to go to Outpatient Therapy for 3 sessions at [facility] and was not pleased due to how busy and crowded the location was. Fell last week in bathroom, didn't take walker in, falls about 2x month. Is very weak in RLE. Patient has a new lift chair and U drive walking. Has a caregiver 4 days a week. Has additional order for radiculopathy, lumbar region. PMH of lumbar laminectomy/decompression microdiscectomy (L3-4, 4-5)  In 2014.  Lumbar 4-5 cage +rod (2016).    Limitations Walking;Standing;Sitting;House hold activities;Lifting    How long can you sit comfortably? not limited with a backrest, without a back rest 3 minutes    How long can you stand comfortably? 3-5 minutes with holding on.    How long can you walk comfortably? with U step 6 minutes.    Patient Stated Goals pain reduction of R hip. strength of legs, dance with wife. push lawnmower.    Currently in Pain? Yes    Pain Score 5     Pain Location Back    Pain Orientation Lower    Pain Descriptors / Indicators Aching;Stabbing;Radiating    Pain Type Chronic pain    Pain Radiating Towards bilateral lateral hips    Pain Onset More than a month ago    Pain Frequency Intermittent    Aggravating Factors  standing up, movement    Pain Relieving Factors laying down, rest    Effect of Pain on Daily Activities decreased balance, posture, ability to move legs.             PMH of lumbar laminectomy/decompression microdiscectomy (L3-4, 4-5)  In 2014.  Lumbar 4-5 cage +rod (2016),   Patient's back pain began > 15 years. Goes next week to get an injection. Pain shoots down bilateral legs on lateral side of leg stopping at knee. Leaning forward hurts more.     PAIN: Worst pain: 9/10 Least amount of pain: 4/10 Current level of pain: 4-5/10   Pain in sharp when getting out of chair, getting out of bed.  Laying down  makes it better.   POSTURE: Seated: Forward head rounded shoulders, thoracic kyphosis, lumbar limited curve Standing: crouch standing pattern, heavy reliance upon UE's for stabilization   PROM/AROM:  AROM BLE:  limited hip extension bilaterally with R >L  STANDING : requires hands on AD for stabilization limiting full mobility Trunk Flexion Limited >25% *; excessive crouching of knees with forward movement  Trunk Extension Limited >25% *  Trunk R SB Unable to attempt w/o LOB *  Trunk L SB Unable to attempt w/o LOB  Trunk R rotation Limited by pain *  Trunk L rotation Limited by pain*   * pain  SEATED Trunk Flexion Excessive hinging at upper thoracic : WFL *  Trunk Extension Pain relieving with repetition , limited    Accessory Motion: (avoiding L3-5 due to surgical  hx) Sacral inferior mob: grade I painful initially, improved with repetition  Lower thoracic to L1: hypomobile, painful    STRENGTH:  Graded on a 0-5 scale Muscle Group Left Right  Hip Flex 3+/5 3/5  Hip Abd 3+/5 3/5  Hip Add 3-/5 3/5  Hip Ext 2+/5 2+/5  Hip IR/ER 2+/5 2+/5  Knee Flex 3+/5 4-/5  Knee Ext 3+/5 4-/5  Ankle DF 4-/5 4-/5  Ankle PF 4-/5 4-/5   SENSATION:   BLE :  * limited by patient cognition, fatigued and "out of it" this session  NEUROLOGICAL SCREEN: (2+ unless otherwise noted.) N=normal  Ab=abnormal   Level Dermatome R L  L2 Medial thigh/groin N N  L3 Lower thigh/med.knee N N  L4 Medial leg/lat thigh N N  L5 Lat. leg & dorsal foot N N  S1 post/lat foot/thigh/leg N N  S2 Post./med. thigh & leg N N    SOMATOSENSORY:  Any N & T in extremities or weakness: reports :         Sensation           Intact      Diminished         Absent  Light touch LEs                              COORDINATION: Finger to Nose: Dysmetric with slow speed and past pointing  SPECIAL TESTS/palpation Slump test + LLE SLR +RLE Unable to do hip testing on R hip due to hx of hip fx/surgery   Palpation:  excessive muscle guarding lumbar paraspinals   FUNCTIONAL MOBILITY: STS:: buckling of bilateral knees Supine <>prone requires min A    BALANCE: Bilateral knee buckling with standing position.    GAIT: Excessive knee crouch, use of AD with buckling of LE's with pain in back  OUTCOME MEASURES: TEST Outcome Interpretation  MODI 64%                             Access Code: [email or web address] URL: [email or web address] Date: 11/25/2019 Prepared by: Janna Arch  Exercises Seated Thoracic Lumbar Extension - 1 x daily - 7 x weekly - 2 sets - 10 reps - 5 hold Supine Sciatic Nerve Mobilization With Leg on Pillow - 1 x daily - 7 x weekly - 2 sets - 10 reps - 5 hold Supine Posterior Pelvic Tilt - 1 x daily - 7 x weekly - 2 sets - 10 reps - 5 hold Hooklying Lumbar Rotation - 1 x daily - 7 x weekly - 2 sets - 10 reps - 5 hold  Patient's condition has the potential to improve in response to therapy. Maximum improvement is yet to be obtained. The anticipated improvement is attainable and reasonable in a generally predictable time.  Patient reports his back pain is limiting all of his mobility and quality of life.    Patient is a pleasant 69 year old male who will have lumbar radiculitis added to his current POC for Parkinson's. Patient's full assessment is limited by patient's cognition this session as well as limited mobility requiring assistance for positioning. Today is a progress note however goals for Parkinson's not addressed due to need to evaluate new referral to integrate into POC. Patient would benefit from skilled physical therapy to increase strength, stability, balance, and decrease pain to improve quality of life  PT Education - 11/25/19 1625    Education provided Yes    Education Details goals, back POC,    Person(s) Educated Patient    Methods Explanation;Demonstration;Tactile cues;Verbal cues    Comprehension Verbalized  understanding;Returned demonstration;Verbal cues required;Tactile cues required            PT Short Term Goals - 10/14/19 1304      PT SHORT TERM GOAL #1   Title Pt will perform HEP with family's/aide's supervision, for improved balance, transfers, and gait.    Baseline 5/5: HEP given 6/28: HEP compliant    Time 4    Period Weeks    Status Partially Met    Target Date 09/19/19             PT Long Term Goals - 11/25/19 1638      PT LONG TERM GOAL #1   Title Patient (> 49 years old) will complete five times sit to stand test in < 15 seconds without UE support without LOB indicating an increased LE strength and improved balance.    Baseline 5/5: 19 seconds one posterior LOB 6/28: 12.32 no UE support    Time 8    Period Weeks    Status Achieved      PT LONG TERM GOAL #2   Title Patient will increase 10 meter walk test to >1.42ms as to improve gait speed for better community ambulation and to reduce fall risk.    Baseline 4/4: 0.71 m/s with U step walker 6/28: 0.917 m/s with U step rollator    Time 8    Period Weeks    Status Partially Met    Target Date 12/09/19      PT LONG TERM GOAL #3   Title Patient will increase Berg Balance score by > 6 points (32/56) to demonstrate decreased fall risk during functional activities.    Baseline 5/5: 26/56 6/28: 36/56    Time 8    Period Weeks    Status Achieved      PT LONG TERM GOAL #4   Title Patient will increase FOTO score to equal to or greater than 31/100  to demonstrate statistically significant improvement in mobility and quality of life.    Baseline 5/5: 12/100 6/28: 38%    Time 8    Period Weeks    Status Achieved      PT LONG TERM GOAL #5   Title Patient will increase six minute walk test distance to >1000 for progression to community ambulator and improve gait ability    Baseline 6/28: 750 ft with U step rollator    Time 8    Period Weeks    Status New    Target Date 12/09/19      Additional Long Term Goals    Additional Long Term Goals Yes      PT LONG TERM GOAL #6   Title Patient will increase Berg Balance score by > 6 points (42/56) to demonstrate decreased fall risk during functional activities.    Baseline 6/28: 36/56    Time 8    Period Weeks    Status New    Target Date 12/09/19      PT LONG TERM GOAL #7   Title Patient will report a worst pain of 3/10 on VAS in low back to improve tolerance with ADLs and reduced symptoms with activities.    Baseline 8/9: 9/10    Time 8    Period Weeks    Status New  Target Date 12/09/19      PT LONG TERM GOAL #8   Title Patient will reduce modified Oswestry score to <20 as to demonstrate minimal disability with ADLs including improved sleeping tolerance, walking/sitting tolerance etc for better mobility with ADLs.    Baseline 8/9: 64%    Time 8    Period Weeks    Status New    Target Date 12/09/19                 Plan - 11/25/19 1636    Clinical Impression Statement Patient is a pleasant 69 year old male who will have lumbar radiculitis added to his current POC for Parkinson's. Patient's full assessment is limited by patient's cognition this session as well as limited mobility requiring assistance for positioning. Today is a progress note however goals for Parkinson's not addressed due to need to evaluate new referral to integrate into POC. Patient would benefit from skilled physical therapy to increase strength, stability, balance, and decrease pain to improve quality of life    Personal Factors and Comorbidities Age;Comorbidity 3+;Past/Current Experience;Time since onset of injury/illness/exacerbation;Transportation    Comorbidities athritis, bradycardia, cancer, depression, dysrhythmia, GERD, HTN, Parkinson's, PTSD, SOB, sleep apnea    Examination-Activity Limitations Bathing;Bed Mobility;Caring for Others;Bend;Dressing;Lift;Locomotion Level;Reach Overhead;Sit;Squat;Stairs;Stand;Toileting;Transfers    Examination-Participation  Restrictions Church;Cleaning;Community Activity;Interpersonal Relationship;Shop;Volunteer;Yard Work;Other    Stability/Clinical Decision Making Unstable/Unpredictable    Rehab Potential Fair    PT Frequency 2x / week    PT Duration 8 weeks    PT Treatment/Interventions ADLs/Self Care Home Management;Aquatic Therapy;Cryotherapy;Electrical Stimulation;Iontophoresis '4mg'$ /ml Dexamethasone;Moist Heat;Ultrasound;Contrast Bath;DME Instruction;Gait training;Stair training;Functional mobility training;Therapeutic activities;Therapeutic exercise;Balance training;Neuromuscular re-education;Patient/family education;Manual techniques;Wheelchair mobility training;Energy conservation;Passive range of motion;Traction;Orthotic Fit/Training;Dry needling;Vestibular;Visual/perceptual remediation/compensation;Taping;Canalith Repostioning    PT Next Visit Plan goals    PT Home Exercise Plan [place] and Agree with Plan of Care Patient;Family member/caregiver    Family Member Consulted wife           Patient will benefit from skilled therapeutic intervention in order to improve the following deficits and impairments:  Abnormal gait, Decreased balance, Decreased coordination, Decreased mobility, Impaired tone, Postural dysfunction, Decreased strength, Decreased safety awareness, Improper body mechanics, Impaired flexibility, Decreased activity tolerance, Decreased endurance, Decreased knowledge of precautions, Difficulty walking, Pain, Cardiopulmonary status limiting activity, Decreased range of motion, Impaired perceived functional ability  Visit Diagnosis: Pain in right hip  Unsteadiness on feet  Muscle weakness (generalized)  Other abnormalities of gait and mobility  Parkinson's disease (HCC)  Chronic low back pain, unspecified back pain laterality, unspecified whether sciatica present     Problem List Patient Active Problem List   Diagnosis Date Noted  . E. coli bacteremia 02/23/2019  .  Dysphagia 02/23/2019  . Aspiration pneumonia ([place]) 02/21/2019  . Closed right hip fracture ([place]) 02/16/2019  . Rib pain on right side 09/25/2018  . Skin sore 12/01/2017  . Hematoma 10/18/2017  . Fall at home 10/18/2017  . 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 Radicular pain in right arm 10/09/2016  . GERD (gastroesophageal reflux disease) 07/31/2016  . Trochanteric bursitis 03/03/2016  . Left hand pain 10/30/2015  . Fracture, finger, multiple sites 10/30/2015  . Colon cancer screening 07/23/2015  . Healthcare maintenance 07/23/2015  . Gout 02/18/2015  . Depression 01/06/2015  . Irritation of eyelid 08/20/2014  . Dupuytren's contracture 08/20/2014  . Cough 07/21/2014  . Lumbar stenosis with neurogenic claudication 06/13/2014  . Preop cardiovascular exam 05/28/2014  . S/P deep brain stimulator placement 05/08/2014  . Joint  pain 01/22/2014  . Medicare annual wellness visit, initial 01/19/2014  . Advance care planning 01/19/2014  . Parkinson's disease ([place]) 12/13/2013  . PTSD (post-traumatic stress disorder) 06/13/2013  . Erectile dysfunction 06/13/2013  . HLD (hyperlipidemia) 06/13/2013  . Hip pain 06/10/2013  . Pain in joint, shoulder region 06/10/2013  . Right leg swelling 06/03/2013  . Essential hypertension 06/03/2013  . Bradycardia by electrocardiogram 06/03/2013  . Obstructive sleep apnea 03/12/2013  . Spinal stenosis, lumbar region, with neurogenic claudication 12/14/2012   Janna Arch, PT, DPT   11/25/2019, 4:41 PM  [place] MAIN [facility] SERVICES [address] [place], [place], [address] Phone: [phone]   Fax:  [phone]  Name: [name] MRN: [id number] Date of Birth: 07-24-1950

## 2019-11-27 ENCOUNTER — Other Ambulatory Visit: Payer: Self-pay

## 2019-11-27 ENCOUNTER — Ambulatory Visit: Payer: Medicare PPO

## 2019-11-27 DIAGNOSIS — M6281 Muscle weakness (generalized): Secondary | ICD-10-CM | POA: Diagnosis not present

## 2019-11-27 DIAGNOSIS — R296 Repeated falls: Secondary | ICD-10-CM | POA: Diagnosis not present

## 2019-11-27 DIAGNOSIS — R2681 Unsteadiness on feet: Secondary | ICD-10-CM

## 2019-11-27 DIAGNOSIS — M545 Low back pain, unspecified: Secondary | ICD-10-CM

## 2019-11-27 DIAGNOSIS — G8929 Other chronic pain: Secondary | ICD-10-CM | POA: Diagnosis not present

## 2019-11-27 DIAGNOSIS — R278 Other lack of coordination: Secondary | ICD-10-CM | POA: Diagnosis not present

## 2019-11-27 DIAGNOSIS — M25551 Pain in right hip: Secondary | ICD-10-CM | POA: Diagnosis not present

## 2019-11-27 DIAGNOSIS — R2689 Other abnormalities of gait and mobility: Secondary | ICD-10-CM | POA: Diagnosis not present

## 2019-11-27 DIAGNOSIS — G20 Parkinson's disease: Secondary | ICD-10-CM | POA: Diagnosis not present

## 2019-11-27 NOTE — Therapy (Signed)
[place] MAIN [facility] SERVICES [address] [place], [place], [address] Phone: [phone]   Fax:  [phone]  Physical Therapy Treatment  Patient Details  Name: [name] MRN: [id number] Date of Birth: [birth year] Referring Provider (PT): Sharolyn Douglas MD    Encounter Date: 11/27/2019   PT End of Session - 11/27/19 1738    Visit Number 21    Number of Visits 26    Date for PT Re-Evaluation 12/09/19    Authorization Type 1/10 PN 11/25/19    Authorization Time Period FOTO- PT    PT Start Time 1016    PT Stop Time 1059    PT Time Calculation (min) 43 min    Equipment Utilized During Treatment Gait belt    Activity Tolerance Patient tolerated treatment well;Patient limited by pain;Other (comment)    Behavior During Therapy WFL for tasks assessed/performed           Past Medical History:  Diagnosis Date  . Arthritis   . Bradycardia   . Cancer [facility]) 2013   skin cancer  . Depression    ptsd  . Dysrhythmia    chronic slow heart rate  . GERD (gastroesophageal reflux disease)   . Headache(784.0)    tension headaches non recent  . History of chicken pox   . History of kidney stones    passed  . Hypertension    treated with HCTZ  . Parkinson's disease ([place])    dx'ed 15 years ago  . PTSD (post-traumatic stress disorder)   . Shortness of breath dyspnea   . Sleep apnea    doesn't use C-pap  . Varicose veins     Past Surgical History:  Procedure Laterality Date  . CHOLECYSTECTOMY N/A 10/22/2014   Procedure: LAPAROSCOPIC CHOLECYSTECTOMY WITH INTRAOPERATIVE CHOLANGIOGRAM;  Surgeon: Dia Crawford III, MD;  Location: [facility] ORS;  Service: General;  Laterality: N/A;  . cyst removed      from lip as a child  . INTRAMEDULLARY (IM) NAIL INTERTROCHANTERIC N/A 02/18/2019   Procedure: INTRAMEDULLARY (IM) NAIL INTERTROCHANTRIC, RIGHT,;  Surgeon: Thornton Park, MD;  Location: [facility] ORS;  Service: Orthopedics;  Laterality: N/A;  . LUMBAR  LAMINECTOMY/DECOMPRESSION MICRODISCECTOMY Bilateral 12/14/2012   Procedure: Bilateral lumbar three-four, four-five decompressive laminotomy/foraminotomy;  Surgeon: Charlie Pitter, MD;  Location: [place] [facility] ORS;  Service: Neurosurgery;  Laterality: Bilateral;  . PULSE GENERATOR IMPLANT Bilateral 12/13/2013   Procedure: Bilateral implantable pulse generator placement;  Surgeon: Erline Levine, MD;  Location: [place] [facility] ORS;  Service: Neurosurgery;  Laterality: Bilateral;  Bilateral implantable pulse generator placement  . skin cancer removed     from ears,   12 lft arm  rt leg 15  . SUBTHALAMIC STIMULATOR BATTERY REPLACEMENT Bilateral 07/14/2017   Procedure: BILATERAL IMPLANTED PULSE GENERATOR CHANGE FOR DEEP BRAIN STIMULATOR;  Surgeon: Erline Levine, MD;  Location: [place];  Service: Neurosurgery;  Laterality: Bilateral;  . SUBTHALAMIC STIMULATOR INSERTION Bilateral 12/06/2013   Procedure: SUBTHALAMIC STIMULATOR INSERTION;  Surgeon: Erline Levine, MD;  Location: [place] [facility] ORS;  Service: Neurosurgery;  Laterality: Bilateral;  Bilateral deep brain stimulator placement    There were no vitals filed for this visit.   Subjective Assessment - 11/27/19 1736    Subjective Patient is still having episodes of confusion per wife. Patient and wife are doing a short trip over the weekend. Back pain is very high this session.    Patient is accompained by: Family member    Pertinent History [name] is  a 69 y.o. male with a hx of HTN, bradycardia, Parkinson's s/p deep brain stimulator 11/2013 , HLD, depression, possible seizure disorder, arthritis, depression/PTSD, dysrhythmia, GERD, sleep apnea. Patient was seen by this therapist in years past. Patient had a fall around Halloween of 2020 and admitted for ORIF on 02/18/19, was discharged to SNF and had [place] around Christmas. Had home health therapy for a month and a half. After that tried to go to Outpatient Therapy for 3 sessions at [facility] and was not pleased due to  how busy and crowded the location was. Fell last week in bathroom, didn't take walker in, falls about 2x month. Is very weak in RLE. Patient has a new lift chair and U drive walking. Has a caregiver 4 days a week. Has additional order for radiculopathy, lumbar region. PMH of lumbar laminectomy/decompression microdiscectomy (L3-4, 4-5)  In 2014.  Lumbar 4-5 cage +rod (2016).    Limitations Walking;Standing;Sitting;House hold activities;Lifting    How long can you sit comfortably? not limited with a backrest, without a back rest 3 minutes    How long can you stand comfortably? 3-5 minutes with holding on.    How long can you walk comfortably? with U step 6 minutes.    Patient Stated Goals pain reduction of R hip. strength of legs, dance with wife. push lawnmower.    Currently in Pain? Yes    Pain Score 8     Pain Location Back    Pain Orientation Lower    Pain Descriptors / Indicators Aching    Pain Type Chronic pain    Pain Onset More than a month ago    Pain Frequency Intermittent             FOTO: 11.7 due to new injury    Manual:   supine:heat under back SAD with belt inferior glide 4x 30 second holds RLE SAD with belt with hip flexion  4x 30 seconds Hamstring lengthening 60 second holds x 2 trials each LE Popliteal angle lengthening 60 secones each LE Piriformis stretch 2x 60 seconds each LE LE rotation 60 seconds x 3 trials STM with implementation of effleurage and ptrissage to lumbar paraspinal and quadratus musculature; multiple trigger points noted throughout region. X 13 minutes   TherEx:  Hooklying with heat under back:  Posterior pelvic tilt 15x 5 second holds Green swiss ball TrA activation press between hands and knees 15x 3 second holds Open book stretch 3x each side 20 second holds    Pt educated throughout session about proper posture and technique with exercises. Improved exercise technique, movement at target joints, use of target muscles after min to mod  verbal, visual, tactile cues  Education on not sitting in car for > 1.5 hours. Seated stretch, muscle activations prior to getting out of car.                PT Education - 11/27/19 1737    Education provided Yes    Education Details exercise technique, body mechanics, manual    Person(s) Educated Patient    Methods Explanation;Demonstration;Tactile cues;Verbal cues    Comprehension Verbalized understanding;Returned demonstration;Verbal cues required;Tactile cues required            PT Short Term Goals - 10/14/19 1304      PT SHORT TERM GOAL #1   Title Pt will perform HEP with family's/aide's supervision, for improved balance, transfers, and gait.    Baseline 5/5: HEP given 6/28: HEP compliant    Time  4    Period Weeks    Status Partially Met    Target Date 09/19/19             PT Long Term Goals - 11/25/19 1638      PT LONG TERM GOAL #1   Title Patient (> 85 years old) will complete five times sit to stand test in < 15 seconds without UE support without LOB indicating an increased LE strength and improved balance.    Baseline 5/5: 19 seconds one posterior LOB 6/28: 12.32 no UE support    Time 8    Period Weeks    Status Achieved      PT LONG TERM GOAL #2   Title Patient will increase 10 meter walk test to >1.22ms as to improve gait speed for better community ambulation and to reduce fall risk.    Baseline 4/4: 0.71 m/s with U step walker 6/28: 0.917 m/s with U step rollator    Time 8    Period Weeks    Status Partially Met    Target Date 12/09/19      PT LONG TERM GOAL #3   Title Patient will increase Berg Balance score by > 6 points (32/56) to demonstrate decreased fall risk during functional activities.    Baseline 5/5: 26/56 6/28: 36/56    Time 8    Period Weeks    Status Achieved      PT LONG TERM GOAL #4   Title Patient will increase FOTO score to equal to or greater than 31/100  to demonstrate statistically significant improvement in mobility  and quality of life.    Baseline 5/5: 12/100 6/28: 38%    Time 8    Period Weeks    Status Achieved      PT LONG TERM GOAL #5   Title Patient will increase six minute walk test distance to >1000 for progression to community ambulator and improve gait ability    Baseline 6/28: 750 ft with U step rollator    Time 8    Period Weeks    Status New    Target Date 12/09/19      Additional Long Term Goals   Additional Long Term Goals Yes      PT LONG TERM GOAL #6   Title Patient will increase Berg Balance score by > 6 points (42/56) to demonstrate decreased fall risk during functional activities.    Baseline 6/28: 36/56    Time 8    Period Weeks    Status New    Target Date 12/09/19      PT LONG TERM GOAL #7   Title Patient will report a worst pain of 3/10 on VAS in low back to improve tolerance with ADLs and reduced symptoms with activities.    Baseline 8/9: 9/10    Time 8    Period Weeks    Status New    Target Date 12/09/19      PT LONG TERM GOAL #8   Title Patient will reduce modified Oswestry score to <20 as to demonstrate minimal disability with ADLs including improved sleeping tolerance, walking/sitting tolerance etc for better mobility with ADLs.    Baseline 8/9: 64%    Time 8    Period Weeks    Status New    Target Date 12/09/19                 Plan - 11/27/19 1740    Clinical Impression Statement Patient's session is  limited by high level of back pain as well as patient altered mental status. Patient reports pain is decreased to 4/10 (half of original pain coming in) by end of session. FOTO assessed with patient having significant decline due to back pain limiting all functional mobility. Patient would benefit from skilled physical therapy to increase strength, stability, balance, and decrease pain to improve quality of life    Personal Factors and Comorbidities Age;Comorbidity 3+;Past/Current Experience;Time since onset of  injury/illness/exacerbation;Transportation    Comorbidities athritis, bradycardia, cancer, depression, dysrhythmia, GERD, HTN, Parkinson's, PTSD, SOB, sleep apnea    Examination-Activity Limitations Bathing;Bed Mobility;Caring for Others;Bend;Dressing;Lift;Locomotion Level;Reach Overhead;Sit;Squat;Stairs;Stand;Toileting;Transfers    Examination-Participation Restrictions Church;Cleaning;Community Activity;Interpersonal Relationship;Shop;Volunteer;Yard Work;Other    Stability/Clinical Decision Making Unstable/Unpredictable    Rehab Potential Fair    PT Frequency 2x / week    PT Duration 8 weeks    PT Treatment/Interventions ADLs/Self Care Home Management;Aquatic Therapy;Cryotherapy;Electrical Stimulation;Iontophoresis 35m/ml Dexamethasone;Moist Heat;Ultrasound;Contrast Bath;DME Instruction;Gait training;Stair training;Functional mobility training;Therapeutic activities;Therapeutic exercise;Balance training;Neuromuscular re-education;Patient/family education;Manual techniques;Wheelchair mobility training;Energy conservation;Passive range of motion;Traction;Orthotic Fit/Training;Dry needling;Vestibular;Visual/perceptual remediation/compensation;Taping;Canalith Repostioning    PT Next Visit Plan goals    PT Home Exercise Plan [place] Agree with Plan of Care Patient;Family member/caregiver    Family Member Consulted wife           Patient will benefit from skilled therapeutic intervention in order to improve the following deficits and impairments:  Abnormal gait, Decreased balance, Decreased coordination, Decreased mobility, Impaired tone, Postural dysfunction, Decreased strength, Decreased safety awareness, Improper body mechanics, Impaired flexibility, Decreased activity tolerance, Decreased endurance, Decreased knowledge of precautions, Difficulty walking, Pain, Cardiopulmonary status limiting activity, Decreased range of motion, Impaired perceived functional ability  Visit  Diagnosis: Pain in right hip  Unsteadiness on feet  Muscle weakness (generalized)  Other abnormalities of gait and mobility  Chronic low back pain, unspecified back pain laterality, unspecified whether sciatica present     Problem List Patient Active Problem List   Diagnosis Date Noted  . E. coli bacteremia 02/23/2019  . Dysphagia 02/23/2019  . Aspiration pneumonia ([place] 02/21/2019  . [place] ([place] 02/16/2019  . Rib pain on right side 09/25/2018  . Skin sore 12/01/2017  . Hematoma 10/18/2017  . Fall at home 10/18/2017  . 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 Radicular pain in right arm 10/09/2016  . GERD (gastroesophageal reflux disease) 07/31/2016  . Trochanteric bursitis 03/03/2016  . Left hand pain 10/30/2015  . Fracture, finger, multiple sites 10/30/2015  . Colon cancer screening 07/23/2015  . Healthcare maintenance 07/23/2015  . Gout 02/18/2015  . Depression 01/06/2015  . Irritation of eyelid 08/20/2014  . Dupuytren's contracture 08/20/2014  . Cough 07/21/2014  . Lumbar stenosis with neurogenic claudication 06/13/2014  . Preop cardiovascular exam 05/28/2014  . S/P deep brain stimulator placement 05/08/2014  . Joint pain 01/22/2014  . Medicare annual wellness visit, initial 01/19/2014  . Advance care planning 01/19/2014  . Parkinson's disease ([place] 12/13/2013  . PTSD (post-traumatic stress disorder) 06/13/2013  . Erectile dysfunction 06/13/2013  . HLD (hyperlipidemia) 06/13/2013  . Hip pain 06/10/2013  . Pain in joint, shoulder region 06/10/2013  . Right leg swelling 06/03/2013  . Essential hypertension 06/03/2013  . Bradycardia by electrocardiogram 06/03/2013  . Obstructive sleep apnea 03/12/2013  . Spinal stenosis, lumbar region, with neurogenic claudication 12/14/2012   MJanna Arch PT, DPT   11/27/2019, 5:41 PM  [facility] [address] [address]Phone:  [phone]   Fax:  [phone]  Name: [name] MRN: [id number] Date of Birth: Mar 17, 1951

## 2019-11-28 ENCOUNTER — Telehealth: Payer: Self-pay | Admitting: Family

## 2019-11-28 DIAGNOSIS — R001 Bradycardia, unspecified: Secondary | ICD-10-CM

## 2019-11-28 DIAGNOSIS — I498 Other specified cardiac arrhythmias: Secondary | ICD-10-CM

## 2019-11-28 DIAGNOSIS — R42 Dizziness and giddiness: Secondary | ICD-10-CM

## 2019-11-28 NOTE — Telephone Encounter (Signed)
Emily Filbert, RN  11/28/2019 1:06 PM EDT     Attempted to call the patient. His wife answered the phone and request we call back in ~ 45 minutes as she is currently tied up.  She is on his DPR.  I advised we will call back.

## 2019-11-28 NOTE — Telephone Encounter (Signed)
Attempted to call the patient. No answer- I left a message to please call back.  

## 2019-11-28 NOTE — Telephone Encounter (Signed)
Loel Dubonnet, NP  11/27/2019 2:44 PM EDT     Cardiac monitor with no evidence of significant AV block nor pauses. He has baseline bradycardia which is known. He did have some episodes of fast heart beats in the top chambers of the heart which were regular. Triggered episodes were associated with a junctional rhythm.   Because of the limitations of medications in setting of Parkinson's, recommend referral to EP for dizziness/bradycardia/junctional rhythm.

## 2019-12-02 ENCOUNTER — Ambulatory Visit: Payer: Medicare PPO

## 2019-12-02 ENCOUNTER — Other Ambulatory Visit: Payer: Self-pay

## 2019-12-02 DIAGNOSIS — R2681 Unsteadiness on feet: Secondary | ICD-10-CM

## 2019-12-02 DIAGNOSIS — M25551 Pain in right hip: Secondary | ICD-10-CM

## 2019-12-02 DIAGNOSIS — G8929 Other chronic pain: Secondary | ICD-10-CM

## 2019-12-02 DIAGNOSIS — M4306 Spondylolysis, lumbar region: Secondary | ICD-10-CM | POA: Diagnosis not present

## 2019-12-02 DIAGNOSIS — R278 Other lack of coordination: Secondary | ICD-10-CM | POA: Diagnosis not present

## 2019-12-02 DIAGNOSIS — M6281 Muscle weakness (generalized): Secondary | ICD-10-CM | POA: Diagnosis not present

## 2019-12-02 DIAGNOSIS — R296 Repeated falls: Secondary | ICD-10-CM | POA: Diagnosis not present

## 2019-12-02 DIAGNOSIS — R2689 Other abnormalities of gait and mobility: Secondary | ICD-10-CM | POA: Diagnosis not present

## 2019-12-02 DIAGNOSIS — M47817 Spondylosis without myelopathy or radiculopathy, lumbosacral region: Secondary | ICD-10-CM | POA: Diagnosis not present

## 2019-12-02 DIAGNOSIS — G20 Parkinson's disease: Secondary | ICD-10-CM

## 2019-12-02 DIAGNOSIS — M545 Low back pain: Secondary | ICD-10-CM | POA: Diagnosis not present

## 2019-12-02 NOTE — Therapy (Signed)
[place] MAIN [facility] SERVICES [address] [place], [place], [address] Phone: [phone]   Fax:  [phone]  Physical Therapy Treatment  Patient Details  Name: [name] MRN: [id number] Date of Birth: 1950/09/29 Referring Provider (PT): George Douglas MD    Encounter Date: 12/02/2019   PT End of Session - 12/02/19 1035    Visit Number 22    Number of Visits 26    Date for PT Re-Evaluation 12/09/19    Authorization Type 2/10 PN 11/25/19    Authorization Time Period FOTO- PT    PT Start Time 1014    PT Stop Time 1059    PT Time Calculation (min) 45 min    Equipment Utilized During Treatment Gait belt    Activity Tolerance Patient tolerated treatment well;Patient limited by pain;Other (comment)    Behavior During Therapy WFL for tasks assessed/performed           Past Medical History:  Diagnosis Date  . Arthritis   . Bradycardia   . Cancer [facility]) 2013   skin cancer  . Depression    ptsd  . Dysrhythmia    chronic slow heart rate  . GERD (gastroesophageal reflux disease)   . Headache(784.0)    tension headaches non recent  . History of chicken pox   . History of kidney stones    passed  . Hypertension    treated with HCTZ  . Parkinson's disease ([place])    dx'ed 15 years ago  . PTSD (post-traumatic stress disorder)   . Shortness of breath dyspnea   . Sleep apnea    doesn't use C-pap  . Varicose veins     Past Surgical History:  Procedure Laterality Date  . CHOLECYSTECTOMY N/A 10/22/2014   Procedure: LAPAROSCOPIC CHOLECYSTECTOMY WITH INTRAOPERATIVE CHOLANGIOGRAM;  Surgeon: George Crawford III, MD;  Location: [facility] ORS;  Service: General;  Laterality: N/A;  . cyst removed      from lip as a child  . INTRAMEDULLARY (IM) NAIL INTERTROCHANTERIC N/A 02/18/2019   Procedure: INTRAMEDULLARY (IM) NAIL INTERTROCHANTRIC, RIGHT,;  Surgeon: George Park, MD;  Location: [facility] ORS;  Service: Orthopedics;  Laterality: N/A;  . LUMBAR  LAMINECTOMY/DECOMPRESSION MICRODISCECTOMY Bilateral 12/14/2012   Procedure: Bilateral lumbar three-four, four-five decompressive laminotomy/foraminotomy;  Surgeon: George Pitter, MD;  Location: [place] NEURO ORS;  Service: Neurosurgery;  Laterality: Bilateral;  . PULSE GENERATOR IMPLANT Bilateral 12/13/2013   Procedure: Bilateral implantable pulse generator placement;  Surgeon: George Levine, MD;  Location: [place] NEURO ORS;  Service: Neurosurgery;  Laterality: Bilateral;  Bilateral implantable pulse generator placement  . skin cancer removed     from ears,   12 lft arm  rt leg 15  . SUBTHALAMIC STIMULATOR BATTERY REPLACEMENT Bilateral 07/14/2017   Procedure: BILATERAL IMPLANTED PULSE GENERATOR CHANGE FOR DEEP BRAIN STIMULATOR;  Surgeon: George Levine, MD;  Location: [place];  Service: Neurosurgery;  Laterality: Bilateral;  . SUBTHALAMIC STIMULATOR INSERTION Bilateral 12/06/2013   Procedure: SUBTHALAMIC STIMULATOR INSERTION;  Surgeon: George Levine, MD;  Location: [place] NEURO ORS;  Service: Neurosurgery;  Laterality: Bilateral;  Bilateral deep brain stimulator placement    There were no vitals filed for this visit.   Subjective Assessment - 12/02/19 1019    Subjective Patient reports he had a bad weekend in pain with extremely high levels of pain. Has been forgetting to lock rollator when standing up/sitting down. No falls despite increased impulsivity of behavior.    Patient is accompained by: Family member    Pertinent  History [name] is a 69 y.o. male with a hx of HTN, bradycardia, Parkinson's s/p deep brain stimulator 11/2013 , HLD, depression, possible seizure disorder, arthritis, depression/PTSD, dysrhythmia, GERD, sleep apnea. Patient was seen by this therapist in years past. Patient had a fall around Halloween of 2020 and admitted for ORIF on 02/18/19, was discharged to SNF and had [place] around Christmas. Had home health therapy for a month and a half. After that tried to go to Outpatient Therapy for 3  sessions at [facility] and was not pleased due to how busy and crowded the location was. Fell last week in bathroom, didn't take walker in, falls about 2x month. Is very weak in RLE. Patient has a new lift chair and U drive walking. Has a caregiver 4 days a week. Has additional order for radiculopathy, lumbar region. PMH of lumbar laminectomy/decompression microdiscectomy (L3-4, 4-5)  In 2014.  Lumbar 4-5 cage +rod (2016).    Limitations Walking;Standing;Sitting;House hold activities;Lifting    How long can you sit comfortably? not limited with a backrest, without a back rest 3 minutes    How long can you stand comfortably? 3-5 minutes with holding on.    How long can you walk comfortably? with U step 6 minutes.    Patient Stated Goals pain reduction of R hip. strength of legs, dance with wife. push lawnmower.    Currently in Pain? Yes    Pain Score 5     Pain Location Back    Pain Orientation Lower    Pain Descriptors / Indicators Aching    Pain Type Chronic pain    Pain Onset More than a month ago                Manual:   supine:heat under back SAD with belt inferior glide 4x 30 second holds RLE SAD with belt with hip flexion  4x 30 seconds Hamstring lengthening 60 second holds x 2 trials each LE Popliteal angle lengthening 60 seconds each LE Piriformis stretch 2x 60 seconds each LE LE rotation 60 seconds x 3 trials STM with implementation of effleurage and ptrissage to lumbar paraspinal and quadratus musculature; multiple trigger points noted throughout region. X 13 minutes    TherEx:  Hooklying with heat under back:  Posterior pelvic tilt 15x 5 second holds Green swiss ball TrA activation press between hands and knees 15x 3 second holds Green swiss ball TrA activation with UE alternating raises 10x each UE,  Open book stretch 3x each side 20 second holds  GTB abduction cues for RLE activating same as L 15x Seated swiss ball TrA activation 10x 3 second    Pt educated  throughout session about proper posture and technique with exercises. Improved exercise technique, movement at target joints, use of target muscles after min to mod verbal, visual, tactile cues                      PT Education - 12/02/19 1035    Education provided Yes    Education Details exercise technique, body mechanics, manual    Person(s) Educated Patient    Methods Explanation;Demonstration;Tactile cues;Verbal cues    Comprehension Verbalized understanding;Returned demonstration;Verbal cues required;Tactile cues required            PT Short Term Goals - 10/14/19 1304      PT SHORT TERM GOAL #1   Title Pt will perform HEP with family's/aide's supervision, for improved balance, transfers, and gait.  Baseline 5/5: HEP given 6/28: HEP compliant    Time 4    Period Weeks    Status Partially Met    Target Date 09/19/19             PT Long Term Goals - 11/25/19 1638      PT LONG TERM GOAL #1   Title Patient (> 82 years old) will complete five times sit to stand test in < 15 seconds without UE support without LOB indicating an increased LE strength and improved balance.    Baseline 5/5: 19 seconds one posterior LOB 6/28: 12.32 no UE support    Time 8    Period Weeks    Status Achieved      PT LONG TERM GOAL #2   Title Patient will increase 10 meter walk test to >1.7m/s as to improve gait speed for better community ambulation and to reduce fall risk.    Baseline 4/4: 0.71 m/s with U step walker 6/28: 0.917 m/s with U step rollator    Time 8    Period Weeks    Status Partially Met    Target Date 12/09/19      PT LONG TERM GOAL #3   Title Patient will increase Berg Balance score by > 6 points (32/56) to demonstrate decreased fall risk during functional activities.    Baseline 5/5: 26/56 6/28: 36/56    Time 8    Period Weeks    Status Achieved      PT LONG TERM GOAL #4   Title Patient will increase FOTO score to equal to or greater than 31/100  to  demonstrate statistically significant improvement in mobility and quality of life.    Baseline 5/5: 12/100 6/28: 38%    Time 8    Period Weeks    Status Achieved      PT LONG TERM GOAL #5   Title Patient will increase six minute walk test distance to >1000 for progression to community ambulator and improve gait ability    Baseline 6/28: 750 ft with U step rollator    Time 8    Period Weeks    Status New    Target Date 12/09/19      Additional Long Term Goals   Additional Long Term Goals Yes      PT LONG TERM GOAL #6   Title Patient will increase Berg Balance score by > 6 points (42/56) to demonstrate decreased fall risk during functional activities.    Baseline 6/28: 36/56    Time 8    Period Weeks    Status New    Target Date 12/09/19      PT LONG TERM GOAL #7   Title Patient will report a worst pain of 3/10 on VAS in low back to improve tolerance with ADLs and reduced symptoms with activities.    Baseline 8/9: 9/10    Time 8    Period Weeks    Status New    Target Date 12/09/19      PT LONG TERM GOAL #8   Title Patient will reduce modified Oswestry score to <20 as to demonstrate minimal disability with ADLs including improved sleeping tolerance, walking/sitting tolerance etc for better mobility with ADLs.    Baseline 8/9: 64%    Time 8    Period Weeks    Status New    Target Date 12/09/19                 Plan -  12/02/19 1254    Clinical Impression Statement Patient continues to progress with functional core stability with decreased pain levels by end of session. Patient reports distraction with belt is best pain relief during session. Strengthening/standing interventions still limited due to low back pain and aggravation from recent trip. Patient would benefit from skilled physical therapy to increase strength, stability, balance, and decrease pain to improve quality of life    Personal Factors and Comorbidities Age;Comorbidity 3+;Past/Current Experience;Time  since onset of injury/illness/exacerbation;Transportation    Comorbidities athritis, bradycardia, cancer, depression, dysrhythmia, GERD, HTN, Parkinson's, PTSD, SOB, sleep apnea    Examination-Activity Limitations Bathing;Bed Mobility;Caring for Others;Bend;Dressing;Lift;Locomotion Level;Reach Overhead;Sit;Squat;Stairs;Stand;Toileting;Transfers    Examination-Participation Restrictions Church;Cleaning;Community Activity;Interpersonal Relationship;Shop;Volunteer;Yard Work;Other    Stability/Clinical Decision Making Unstable/Unpredictable    Rehab Potential Fair    PT Frequency 2x / week    PT Duration 8 weeks    PT Treatment/Interventions ADLs/Self Care Home Management;Aquatic Therapy;Cryotherapy;Electrical Stimulation;Iontophoresis '4mg'$ /ml Dexamethasone;Moist Heat;Ultrasound;Contrast Bath;DME Instruction;Gait training;Stair training;Functional mobility training;Therapeutic activities;Therapeutic exercise;Balance training;Neuromuscular re-education;Patient/family education;Manual techniques;Wheelchair mobility training;Energy conservation;Passive range of motion;Traction;Orthotic Fit/Training;Dry needling;Vestibular;Visual/perceptual remediation/compensation;Taping;Canalith Repostioning    PT Next Visit Plan core and pain relief    PT Home Exercise Plan Walker and Agree with Plan of Care Patient;Family member/caregiver    Family Member Consulted wife           Patient will benefit from skilled therapeutic intervention in order to improve the following deficits and impairments:  Abnormal gait, Decreased balance, Decreased coordination, Decreased mobility, Impaired tone, Postural dysfunction, Decreased strength, Decreased safety awareness, Improper body mechanics, Impaired flexibility, Decreased activity tolerance, Decreased endurance, Decreased knowledge of precautions, Difficulty walking, Pain, Cardiopulmonary status limiting activity, Decreased range of motion, Impaired perceived  functional ability  Visit Diagnosis: Pain in right hip  Unsteadiness on feet  Muscle weakness (generalized)  Other abnormalities of gait and mobility  Chronic low back pain, unspecified back pain laterality, unspecified whether sciatica present  Parkinson's disease [facility])     Problem List Patient Active Problem List   Diagnosis Date Noted  . E. coli bacteremia 02/23/2019  . Dysphagia 02/23/2019  . Aspiration pneumonia ([place]) 02/21/2019  . Closed right hip fracture ([place]) 02/16/2019  . Rib pain on right side 09/25/2018  . Skin sore 12/01/2017  . Hematoma 10/18/2017  . Fall at home 10/18/2017  . 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 Radicular pain in right arm 10/09/2016  . GERD (gastroesophageal reflux disease) 07/31/2016  . Trochanteric bursitis 03/03/2016  . Left hand pain 10/30/2015  . Fracture, finger, multiple sites 10/30/2015  . Colon cancer screening 07/23/2015  . Healthcare maintenance 07/23/2015  . Gout 02/18/2015  . Depression 01/06/2015  . Irritation of eyelid 08/20/2014  . Dupuytren's contracture 08/20/2014  . Cough 07/21/2014  . Lumbar stenosis with neurogenic claudication 06/13/2014  . Preop cardiovascular exam 05/28/2014  . S/P deep brain stimulator placement 05/08/2014  . Joint pain 01/22/2014  . Medicare annual wellness visit, initial 01/19/2014  . Advance care planning 01/19/2014  . Parkinson's disease ([place]) 12/13/2013  . PTSD (post-traumatic stress disorder) 06/13/2013  . Erectile dysfunction 06/13/2013  . HLD (hyperlipidemia) 06/13/2013  . Hip pain 06/10/2013  . Pain in joint, shoulder region 06/10/2013  . Right leg swelling 06/03/2013  . Essential hypertension 06/03/2013  . Bradycardia by electrocardiogram 06/03/2013  . Obstructive sleep apnea 03/12/2013  . Spinal stenosis, lumbar region, with neurogenic claudication 12/14/2012   Janna Arch, PT, DPT   12/02/2019, 12:56 PM  [place] [facility] REHAB  SERVICES [place], [place], [address] Phone: [phone]   Fax:  [phone]  Name: [name] MRN: [id number] Date of Birth: [birth year]

## 2019-12-02 NOTE — Telephone Encounter (Signed)
Wife and patient on speaker phone. They verbalized understanding of results and plan of care. Agreeable to referral to EP. Referral placed and routing to scheduling to arrange.

## 2019-12-04 ENCOUNTER — Other Ambulatory Visit: Payer: Self-pay

## 2019-12-04 ENCOUNTER — Ambulatory Visit: Payer: Medicare PPO

## 2019-12-04 DIAGNOSIS — G8929 Other chronic pain: Secondary | ICD-10-CM

## 2019-12-04 DIAGNOSIS — R2689 Other abnormalities of gait and mobility: Secondary | ICD-10-CM

## 2019-12-04 DIAGNOSIS — M545 Low back pain, unspecified: Secondary | ICD-10-CM

## 2019-12-04 DIAGNOSIS — R29818 Other symptoms and signs involving the nervous system: Secondary | ICD-10-CM

## 2019-12-04 DIAGNOSIS — R278 Other lack of coordination: Secondary | ICD-10-CM

## 2019-12-04 DIAGNOSIS — G20 Parkinson's disease: Secondary | ICD-10-CM

## 2019-12-04 DIAGNOSIS — R2681 Unsteadiness on feet: Secondary | ICD-10-CM

## 2019-12-04 DIAGNOSIS — R296 Repeated falls: Secondary | ICD-10-CM

## 2019-12-04 DIAGNOSIS — M25551 Pain in right hip: Secondary | ICD-10-CM

## 2019-12-04 DIAGNOSIS — R262 Difficulty in walking, not elsewhere classified: Secondary | ICD-10-CM

## 2019-12-04 DIAGNOSIS — M6281 Muscle weakness (generalized): Secondary | ICD-10-CM

## 2019-12-04 DIAGNOSIS — R293 Abnormal posture: Secondary | ICD-10-CM

## 2019-12-04 NOTE — Therapy (Signed)
[place] MAIN [facility] SERVICES [address] [place], [place], [address] Phone: [phone]   Fax:  [phone]  Physical Therapy Treatment  Patient Details  Name: [name] MRN: [id number] Date of Birth: 1950/12/20 Referring Provider (PT): Sharolyn Douglas MD    Encounter Date: 12/04/2019   PT End of Session - 12/04/19 1023    Visit Number 23    Number of Visits 26    Date for PT Re-Evaluation 12/09/19    Authorization Time Period FOTO- PT    PT Start Time 1016    PT Stop Time 1056    PT Time Calculation (min) 40 min    Activity Tolerance Patient tolerated treatment well;No increased pain;Patient limited by fatigue    Behavior During Therapy [facility] for tasks assessed/performed           Past Medical History:  Diagnosis Date  . Arthritis   . Bradycardia   . Cancer [facility]) 2013   skin cancer  . Depression    ptsd  . Dysrhythmia    chronic slow heart rate  . GERD (gastroesophageal reflux disease)   . Headache(784.0)    tension headaches non recent  . History of chicken pox   . History of kidney stones    passed  . Hypertension    treated with HCTZ  . Parkinson's disease ([place])    dx'ed 15 years ago  . PTSD (post-traumatic stress disorder)   . Shortness of breath dyspnea   . Sleep apnea    doesn't use C-pap  . Varicose veins     Past Surgical History:  Procedure Laterality Date  . CHOLECYSTECTOMY N/A 10/22/2014   Procedure: LAPAROSCOPIC CHOLECYSTECTOMY WITH INTRAOPERATIVE CHOLANGIOGRAM;  Surgeon: Dia Crawford III, MD;  Location: [facility] ORS;  Service: General;  Laterality: N/A;  . cyst removed      from lip as a child  . INTRAMEDULLARY (IM) NAIL INTERTROCHANTERIC N/A 02/18/2019   Procedure: INTRAMEDULLARY (IM) NAIL INTERTROCHANTRIC, RIGHT,;  Surgeon: Thornton Park, MD;  Location: [facility] ORS;  Service: Orthopedics;  Laterality: N/A;  . LUMBAR LAMINECTOMY/DECOMPRESSION MICRODISCECTOMY Bilateral 12/14/2012   Procedure: Bilateral  lumbar three-four, four-five decompressive laminotomy/foraminotomy;  Surgeon: Charlie Pitter, MD;  Location: [place] NEURO ORS;  Service: Neurosurgery;  Laterality: Bilateral;  . PULSE GENERATOR IMPLANT Bilateral 12/13/2013   Procedure: Bilateral implantable pulse generator placement;  Surgeon: Erline Levine, MD;  Location: [place] NEURO ORS;  Service: Neurosurgery;  Laterality: Bilateral;  Bilateral implantable pulse generator placement  . skin cancer removed     from ears,   12 lft arm  rt leg 15  . SUBTHALAMIC STIMULATOR BATTERY REPLACEMENT Bilateral 07/14/2017   Procedure: BILATERAL IMPLANTED PULSE GENERATOR CHANGE FOR DEEP BRAIN STIMULATOR;  Surgeon: Erline Levine, MD;  Location: [place];  Service: Neurosurgery;  Laterality: Bilateral;  . SUBTHALAMIC STIMULATOR INSERTION Bilateral 12/06/2013   Procedure: SUBTHALAMIC STIMULATOR INSERTION;  Surgeon: Erline Levine, MD;  Location: [place] NEURO ORS;  Service: Neurosurgery;  Laterality: Bilateral;  Bilateral deep brain stimulator placement    There were no vitals filed for this visit.   Subjective Assessment - 12/04/19 1022    Subjective Pt doing well today. Has a temp nerve block on Monday which seemed to not work initially, but is not providing some relief.    Pertinent History [name] is a 69 y.o. male with a hx of HTN, bradycardia, Parkinson's s/p deep brain stimulator 11/2013 , HLD, depression, possible seizure disorder, arthritis, depression/PTSD, dysrhythmia, GERD, sleep apnea. Patient  was seen by this therapist in years past. Patient had a fall around Halloween of 2020 and admitted for ORIF on 02/18/19, was discharged to SNF and had Ewa Gentry around Christmas. Had home health therapy for a month and a half. After that tried to go to Outpatient Therapy for 3 sessions at [facility] and was not pleased due to how busy and crowded the location was. Fell last week in bathroom, didn't take walker in, falls about 2x month. Is very weak in RLE. Patient has a new lift  chair and U drive walking. Has a caregiver 4 days a week. Has additional order for radiculopathy, lumbar region. PMH of lumbar laminectomy/decompression microdiscectomy (L3-4, 4-5)  In 2014.  Lumbar 4-5 cage +rod (2016).    Currently in Pain? Yes    Pain Score 3     Pain Location Back          INTERVENTION THIS DATE:  supine:heat under back SAD with belt inferior glide 3x30 second holds RLE, Rt hip at 90 degrees LAD with belt 1x3 minutes, hip in open pact position P/ROM hip flexion (wife) 1x30 Clam band TB 2x15  Right hip distraction, fulcrum mobilization 2x30sec Grade 3 Hamstring lengthening 60 second holds x 2 trials each LE Hooklying FABER ER stretch RLE 2x30sec (P/ROM)  Piriformis stretch 2x 60 seconds RLE  Hooklying Bridge 2x10  Hooklying marching 2x20, alternating pattern  Dead bug isometrics 5x5secH, assist into position.       Pt educated throughout session about proper posture and technique with exercises. Improved exercise technique, movement at target joints, use of target muscles after min to mod verbal, visual, tactile cues   PT Short Term Goals - 10/14/19 1304      PT SHORT TERM GOAL #1   Title Pt will perform HEP with family's/aide's supervision, for improved balance, transfers, and gait.    Baseline 5/5: HEP given 6/28: HEP compliant    Time 4    Period Weeks    Status Partially Met    Target Date 09/19/19             PT Long Term Goals - 11/25/19 1638      PT LONG TERM GOAL #1   Title Patient (> 83 years old) will complete five times sit to stand test in < 15 seconds without UE support without LOB indicating an increased LE strength and improved balance.    Baseline 5/5: 19 seconds one posterior LOB 6/28: 12.32 no UE support    Time 8    Period Weeks    Status Achieved      PT LONG TERM GOAL #2   Title Patient will increase 10 meter walk test to >1.58ms as to improve gait speed for better community ambulation and to reduce fall risk.    Baseline  4/4: 0.71 m/s with U step walker 6/28: 0.917 m/s with U step rollator    Time 8    Period Weeks    Status Partially Met    Target Date 12/09/19      PT LONG TERM GOAL #3   Title Patient will increase Berg Balance score by > 6 points (32/56) to demonstrate decreased fall risk during functional activities.    Baseline 5/5: 26/56 6/28: 36/56    Time 8    Period Weeks    Status Achieved      PT LONG TERM GOAL #4   Title Patient will increase FOTO score to equal to or greater than 31/100  to demonstrate statistically significant improvement in mobility and quality of life.    Baseline 5/5: 12/100 6/28: 38%    Time 8    Period Weeks    Status Achieved      PT LONG TERM GOAL #5   Title Patient will increase six minute walk test distance to >1000 for progression to community ambulator and improve gait ability    Baseline 6/28: 750 ft with U step rollator    Time 8    Period Weeks    Status New    Target Date 12/09/19      Additional Long Term Goals   Additional Long Term Goals Yes      PT LONG TERM GOAL #6   Title Patient will increase Berg Balance score by > 6 points (42/56) to demonstrate decreased fall risk during functional activities.    Baseline 6/28: 36/56    Time 8    Period Weeks    Status New    Target Date 12/09/19      PT LONG TERM GOAL #7   Title Patient will report a worst pain of 3/10 on VAS in low back to improve tolerance with ADLs and reduced symptoms with activities.    Baseline 8/9: 9/10    Time 8    Period Weeks    Status New    Target Date 12/09/19      PT LONG TERM GOAL #8   Title Patient will reduce modified Oswestry score to <20 as to demonstrate minimal disability with ADLs including improved sleeping tolerance, walking/sitting tolerance etc for better mobility with ADLs.    Baseline 8/9: 64%    Time 8    Period Weeks    Status New    Target Date 12/09/19                 Plan - 12/04/19 1038    Clinical Impression Statement Pt able to  complete entire session as planned with rest breaks provided as needed. Heavy manual therapies to address tightness and dyscomfort in Rt hip, pt reponds favorably with improve activation and comfort. Pt maintains high level of focus and motivation. Extensive verbal, visual, and tactile cues are provided for most accurate form possible. Author provides minA intermittently for full ROM when needed. Overall pt continues to make steady progress toward treatment goals.    Personal Factors and Comorbidities Age;Comorbidity 3+;Past/Current Experience;Time since onset of injury/illness/exacerbation;Transportation    Comorbidities athritis, bradycardia, cancer, depression, dysrhythmia, GERD, HTN, Parkinson's, PTSD, SOB, sleep apnea    Examination-Activity Limitations Bathing;Bed Mobility;Caring for Others;Bend;Dressing;Lift;Locomotion Level;Reach Overhead;Sit;Squat;Stairs;Stand;Toileting;Transfers    Examination-Participation Restrictions Church;Cleaning;Community Activity;Interpersonal Relationship;Shop;Volunteer;Yard Work;Other    Stability/Clinical Decision Making Stable/Uncomplicated    Clinical Decision Making High    Rehab Potential Fair    PT Frequency 2x / week    PT Duration 8 weeks    PT Treatment/Interventions ADLs/Self Care Home Management;Aquatic Therapy;Cryotherapy;Electrical Stimulation;Iontophoresis 8m/ml Dexamethasone;Moist Heat;Ultrasound;Contrast Bath;DME Instruction;Gait training;Stair training;Functional mobility training;Therapeutic activities;Therapeutic exercise;Balance training;Neuromuscular re-education;Patient/family education;Manual techniques;Wheelchair mobility training;Energy conservation;Passive range of motion;Traction;Orthotic Fit/Training;Dry needling;Vestibular;Visual/perceptual remediation/compensation;Taping;Canalith Repostioning    PT Next Visit Plan core and pain relief    PT Home Exercise Plan [place] Agree with Plan of Care Patient;Family  member/caregiver    Family Member Consulted wife           Patient will benefit from skilled therapeutic intervention in order to improve the following deficits and impairments:  Abnormal gait, Decreased balance, Decreased coordination, Decreased mobility, Impaired tone,  Postural dysfunction, Decreased strength, Decreased safety awareness, Improper body mechanics, Impaired flexibility, Decreased activity tolerance, Decreased endurance, Decreased knowledge of precautions, Difficulty walking, Pain, Cardiopulmonary status limiting activity, Decreased range of motion, Impaired perceived functional ability  Visit Diagnosis: Pain in right hip  Unsteadiness on feet  Muscle weakness (generalized)  Other abnormalities of gait and mobility  Chronic low back pain, unspecified back pain laterality, unspecified whether sciatica present  Parkinson's disease ([place])  Other lack of coordination  Repeated falls  Difficulty walking  Abnormal posture  Other symptoms and signs involving the nervous system     Problem List Patient Active Problem List   Diagnosis Date Noted  . E. coli bacteremia 02/23/2019  . Dysphagia 02/23/2019  . Aspiration pneumonia ([place]) 02/21/2019  . Closed right hip fracture ([place]) 02/16/2019  . Rib pain on right side 09/25/2018  . Skin sore 12/01/2017  . Hematoma 10/18/2017  . Fall at home 10/18/2017  . 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 Radicular pain in right arm 10/09/2016  . GERD (gastroesophageal reflux disease) 07/31/2016  . Trochanteric bursitis 03/03/2016  . Left hand pain 10/30/2015  . Fracture, finger, multiple sites 10/30/2015  . Colon cancer screening 07/23/2015  . Healthcare maintenance 07/23/2015  . Gout 02/18/2015  . Depression 01/06/2015  . Irritation of eyelid 08/20/2014  . Dupuytren's contracture 08/20/2014  . Cough 07/21/2014  . Lumbar stenosis with neurogenic claudication 06/13/2014  . Preop cardiovascular exam 05/28/2014  . S/P deep  brain stimulator placement 05/08/2014  . Joint pain 01/22/2014  . Medicare annual wellness visit, initial 01/19/2014  . Advance care planning 01/19/2014  . Parkinson's disease ([place]) 12/13/2013  . PTSD (post-traumatic stress disorder) 06/13/2013  . Erectile dysfunction 06/13/2013  . HLD (hyperlipidemia) 06/13/2013  . Hip pain 06/10/2013  . Pain in joint, shoulder region 06/10/2013  . Right leg swelling 06/03/2013  . Essential hypertension 06/03/2013  . Bradycardia by electrocardiogram 06/03/2013  . Obstructive sleep apnea 03/12/2013  . Spinal stenosis, lumbar region, with neurogenic claudication 12/14/2012   10:54 AM, 12/04/19 Etta Grandchild, PT, DPT Physical Therapist - [place] [phone]     Etta Grandchild 12/04/2019, 10:48 AM  [place] MAIN [facility] SERVICES [address] [place], [place], [address] Phone: [phone]   Fax:  [phone]  Name: [name] MRN: [id number] Date of Birth: 07/26/1950

## 2019-12-05 ENCOUNTER — Telehealth: Payer: Self-pay | Admitting: Adult Health Nurse Practitioner

## 2019-12-05 NOTE — Telephone Encounter (Signed)
Called to schedule appointment.  Left VM with reason for call and call back info Tavie Haseman K. Nyeisha Goodall NP 

## 2019-12-09 ENCOUNTER — Other Ambulatory Visit: Payer: Self-pay

## 2019-12-09 ENCOUNTER — Ambulatory Visit: Payer: Medicare PPO

## 2019-12-09 ENCOUNTER — Ambulatory Visit (INDEPENDENT_AMBULATORY_CARE_PROVIDER_SITE_OTHER): Payer: Medicare PPO | Admitting: Psychology

## 2019-12-09 DIAGNOSIS — R2681 Unsteadiness on feet: Secondary | ICD-10-CM

## 2019-12-09 DIAGNOSIS — R296 Repeated falls: Secondary | ICD-10-CM | POA: Diagnosis not present

## 2019-12-09 DIAGNOSIS — M25551 Pain in right hip: Secondary | ICD-10-CM

## 2019-12-09 DIAGNOSIS — F331 Major depressive disorder, recurrent, moderate: Secondary | ICD-10-CM | POA: Diagnosis not present

## 2019-12-09 DIAGNOSIS — R2689 Other abnormalities of gait and mobility: Secondary | ICD-10-CM

## 2019-12-09 DIAGNOSIS — F4323 Adjustment disorder with mixed anxiety and depressed mood: Secondary | ICD-10-CM

## 2019-12-09 DIAGNOSIS — M6281 Muscle weakness (generalized): Secondary | ICD-10-CM

## 2019-12-09 DIAGNOSIS — G8929 Other chronic pain: Secondary | ICD-10-CM | POA: Diagnosis not present

## 2019-12-09 DIAGNOSIS — R278 Other lack of coordination: Secondary | ICD-10-CM | POA: Diagnosis not present

## 2019-12-09 DIAGNOSIS — G20 Parkinson's disease: Secondary | ICD-10-CM | POA: Diagnosis not present

## 2019-12-09 DIAGNOSIS — M545 Low back pain: Secondary | ICD-10-CM | POA: Diagnosis not present

## 2019-12-09 NOTE — Therapy (Signed)
[place] MAIN [facility] SERVICES [address] [place], [place], [address] Phone: [phone]   Fax:  [phone]  Physical Therapy Treatment/RECERT  Patient Details  Name: [name] MRN: [id number] Date of Birth: [birth year] Referring Provider (PT): Sharolyn Douglas MD    Encounter Date: 12/09/2019   PT End of Session - 12/09/19 1247    Visit Number 24    Number of Visits 40    Date for PT Re-Evaluation 02/03/20    Authorization Time Period FOTO- PT    PT Start Time 1016    PT Stop Time 1059    PT Time Calculation (min) 43 min    Equipment Utilized During Treatment Gait belt    Activity Tolerance Patient tolerated treatment well;No increased pain;Patient limited by fatigue    Behavior During Therapy [facility] for tasks assessed/performed           Past Medical History:  Diagnosis Date  . Arthritis   . Bradycardia   . Cancer [facility]) 2013   skin cancer  . Depression    ptsd  . Dysrhythmia    chronic slow heart rate  . GERD (gastroesophageal reflux disease)   . Headache(784.0)    tension headaches non recent  . History of chicken pox   . History of kidney stones    passed  . Hypertension    treated with HCTZ  . Parkinson's disease ([place])    dx'ed 15 years ago  . PTSD (post-traumatic stress disorder)   . Shortness of breath dyspnea   . Sleep apnea    doesn't use C-pap  . Varicose veins     Past Surgical History:  Procedure Laterality Date  . CHOLECYSTECTOMY N/A 10/22/2014   Procedure: LAPAROSCOPIC CHOLECYSTECTOMY WITH INTRAOPERATIVE CHOLANGIOGRAM;  Surgeon: Dia Crawford III, MD;  Location: [facility] ORS;  Service: General;  Laterality: N/A;  . cyst removed      from lip as a child  . INTRAMEDULLARY (IM) NAIL INTERTROCHANTERIC N/A 02/18/2019   Procedure: INTRAMEDULLARY (IM) NAIL INTERTROCHANTRIC, RIGHT,;  Surgeon: Thornton Park, MD;  Location: [facility] ORS;  Service: Orthopedics;  Laterality: N/A;  . LUMBAR LAMINECTOMY/DECOMPRESSION  MICRODISCECTOMY Bilateral 12/14/2012   Procedure: Bilateral lumbar three-four, four-five decompressive laminotomy/foraminotomy;  Surgeon: Charlie Pitter, MD;  Location: [place] [facility] ORS;  Service: Neurosurgery;  Laterality: Bilateral;  . PULSE GENERATOR IMPLANT Bilateral 12/13/2013   Procedure: Bilateral implantable pulse generator placement;  Surgeon: Erline Levine, MD;  Location: [place] [facility] OR[facility];  Service: Neurosurgery;  Laterality: Bilateral;  Bilateral implantable pulse generator placement  . skin cancer removed     from ears,   12 lft arm  rt leg 15  . SUBTHALAMIC STIMULATOR BATTERY REPLACEMENT Bilateral 07/14/2017   Procedure: BILATERAL IMPLANTED PULSE GENERATOR CHANGE FOR DEEP BRAIN STIMULATOR;  Surgeon: Erline Levine, MD;  Location: [place];  Service: Neurosurgery;  Laterality: Bilateral;  . SUBTHALAMIC STIMULATOR INSERTION Bilateral 12/06/2013   Procedure: SUBTHALAMIC STIMULATOR INSERTION;  Surgeon: Erline Levine, MD;  Location: [place] [facility] ORS;  Service: Neurosurgery;  Laterality: Bilateral;  Bilateral deep brain stimulator placement    There were no vitals filed for this visit.   Subjective Assessment - 12/09/19 1245    Subjective Patient reports pain continues to limit his mobility. Is lower today but continues to be a factor with his mobility.    Pertinent History [name] is a 69 y.o. male with a hx of HTN, bradycardia, Parkinson's s/p deep brain stimulator 11/2013 , HLD, depression, possible seizure disorder, arthritis,  depression/PTSD, dysrhythmia, GERD, sleep apnea. Patient was seen by this therapist in years past. Patient had a fall around Halloween of 2020 and admitted for ORIF on 02/18/19, was discharged to SNF and had [place] around Christmas. Had home health therapy for a month and a half. After that tried to go to Outpatient Therapy for 3 sessions at [facility] and was not pleased due to how busy and crowded the location was. Fell last week in bathroom, didn't take walker in, falls about 2x  month. Is very weak in RLE. Patient has a new lift chair and U drive walking. Has a caregiver 4 days a week. Has additional order for radiculopathy, lumbar region. PMH of lumbar laminectomy/decompression microdiscectomy (L3-4, 4-5)  In 2014.  Lumbar 4-5 cage +rod (2016).    Limitations Walking;Standing;Sitting;House hold activities;Lifting    How long can you sit comfortably? not limited with a backrest, without a back rest 3 minutes    How long can you stand comfortably? 3-5 minutes with holding on.    Patient Stated Goals pain reduction of R hip. strength of legs, dance with wife. push lawnmower.    Currently in Pain? Yes    Pain Score 3     Pain Location Back    Pain Orientation Lower    Pain Descriptors / Indicators Aching    Pain Type Chronic pain    Pain Onset More than a month ago    Pain Frequency Intermittent             current pain 3/10   [facility] PT Assessment - 12/09/19 0001      Standardized Balance Assessment   Standardized Balance Assessment Berg Balance Test      Berg Balance Test   Sit to Stand Able to stand without using hands and stabilize independently    Standing Unsupported Able to stand 2 minutes with supervision    Sitting with Back Unsupported but Feet Supported on Floor or Stool Able to sit safely and securely 2 minutes    Stand to Sit Sits safely with minimal use of hands    Transfers Able to transfer safely, minor use of hands    Standing Unsupported with Eyes Closed Able to stand 10 seconds with supervision    Standing Unsupported with Feet Together Able to place feet together independently and stand for 1 minute with supervision    From Standing, Reach Forward with Outstretched Arm Can reach forward >5 cm safely (2")    From Standing Position, Pick up Object from [place] to pick up shoe, needs supervision    From Standing Position, Turn to Look Behind Over each Shoulder Looks behind one side only/other side shows less weight shift    Turn 360 Degrees  Able to turn 360 degrees safely but slowly    Standing Unsupported, Alternately Place Feet on Step/Stool Able to stand independently and complete 8 steps >20 seconds    Standing Unsupported, One Foot in Front Able to take small step independently and hold 30 seconds    Standing on One Leg Able to lift leg independently and hold equal to or more than 3 seconds    Total Score 42         Goals:   10 MWT: 9. 47 seconds with rolled walker =>1.0 m/s with rollator  BERG: 42/56  FOTO: 11.767  6 min : 775 ft with rolling walker, increasing crouch pattern of ambulation  VAS: 9/10 two weeks, 7/10 pain since shot  MODI: 66%  INTERVENTION THIS DATE:  supine: heat under back SAD with belt inferior glide 3x30 second holds RLE, Rt hip at 90 degrees LAD with belt 1x3 minutes, hip in open pact position P/ROM hip flexion (wife) 1x30 Clam band TB 2x15  Right hip distraction, fulcrum mobilization 2x30sec Grade 3 Hamstring lengthening 60 second holds x 2 trials each LE  Education and practice of standing up/sitting down from rollator seat safely x 6    Pt educated throughout session about proper posture and technique with exercises. Improved exercise technique, movement at target joints, use of target muscles after min to mod verbal, visual, tactile cues    Patient's progression towards functional outcomes has been impacted by recent aggravation of low back injury resulting in higher pain levels and more limited mobility. Has met gait speed goal for >1.0 m/s with rollator. His Merrilee Jansky has improved to 42/56 indicating improved focused stabilization. Patient would benefit from skilled physical therapy to increase strength, stability, balance, and decrease pain to improve quality of life                   PT Education - 12/09/19 1246    Education provided Yes    Education Details goals, POC,    Person(s) Educated Patient    Methods Explanation;Demonstration;Tactile cues;Verbal cues     Comprehension Verbalized understanding;Returned demonstration;Verbal cues required;Tactile cues required            PT Short Term Goals - 12/09/19 1247      PT SHORT TERM GOAL #1   Title Pt will perform HEP with family's/aide's supervision, for improved balance, transfers, and gait.    Baseline 5/5: HEP given 6/28: HEP compliant 8/23: aide helps with HEP    Time 4    Period Weeks    Status Achieved    Target Date 09/19/19             PT Long Term Goals - 12/09/19 1248      PT LONG TERM GOAL #1   Title Patient (> 54 years old) will complete five times sit to stand test in < 15 seconds without UE support without LOB indicating an increased LE strength and improved balance.    Baseline 5/5: 19 seconds one posterior LOB 6/28: 12.32 no UE support    Time 8    Period Weeks    Status Achieved      PT LONG TERM GOAL #2   Title Patient will increase 10 meter walk test to >1.10m/s as to improve gait speed for better community ambulation and to reduce fall risk.    Baseline 4/4: 0.71 m/s with U step walker 6/28: 0.917 m/s with U step rollator 8/23: >1.0 m/s with rollator    Time 8    Period Weeks    Status Achieved      PT LONG TERM GOAL #3   Title Patient will increase Berg Balance score by > 6 points (32/56) to demonstrate decreased fall risk during functional activities.    Baseline 5/5: 26/56 6/28: 36/56    Time 8    Period Weeks    Status Achieved      PT LONG TERM GOAL #4   Title Patient will increase FOTO score to equal to or greater than 31/100  to demonstrate statistically significant improvement in mobility and quality of life.    Baseline 5/5: 12/100 6/28: 38% 8/23: 11.7    Time 8    Period Weeks    Status On-going  Target Date 02/03/20      PT LONG TERM GOAL #5   Title Patient will increase six minute walk test distance to >1000 for progression to community ambulator and improve gait ability    Baseline 6/28: 750 ft with U step rollator 8/23: 775 w rolling  walker, increased crouch pattern of ambulation    Time 8    Period Weeks    Status Partially Met    Target Date 02/03/20      PT LONG TERM GOAL #6   Title Patient will increase Berg Balance score by > 6 points (42/56) to demonstrate decreased fall risk during functional activities.    Baseline 6/28: 36/56 8/23: 42/56    Time 8    Period Weeks    Status Partially Met    Target Date 02/03/20      PT LONG TERM GOAL #7   Title Patient will report a worst pain of 3/10 on VAS in low back to improve tolerance with ADLs and reduced symptoms with activities.    Baseline 8/9: 9/10 8/23: 7/10 pain    Time 8    Period Weeks    Status Partially Met    Target Date 02/03/20      PT LONG TERM GOAL #8   Title Patient will reduce modified Oswestry score to <20 as to demonstrate minimal disability with ADLs including improved sleeping tolerance, walking/sitting tolerance etc for better mobility with ADLs.    Baseline 8/9: 64% 8/23: 66%    Time 8    Period Weeks    Status On-going    Target Date 02/03/20                 Plan - 12/09/19 1251    Clinical Impression Statement Patient's progression towards functional outcomes has been impacted by recent aggravation of low back injury resulting in higher pain levels and more limited mobility. Has met gait speed goal for >1.0 m/s with rollator. His [place]ri[place] Jansky has improved to 42/56 indicating improved focused stabilization. Patient would benefit from skilled physical therapy to increase strength, stability, balance, and decrease pain to improve quality of life    Personal Factors and Comorbidities Age;Comorbidity 3+;Past/Current Experience;Time since onset of injury/illness/exacerbation;Transportation    Comorbidities athritis, bradycardia, cancer, depression, dysrhythmia, GERD, HTN, Parkinson's, PTSD, SOB, sleep apnea    Examination-Activity Limitations Bathing;Bed Mobility;Caring for Others;Bend;Dressing;Lift;Locomotion Level;Reach  Overhead;Sit;Squat;Stairs;Stand;Toileting;Transfers    Examination-Participation Restrictions Church;Cleaning;Community Activity;Interpersonal Relationship;Shop;Volunteer;Yard Work;Other    Stability/Clinical Decision Making Stable/Uncomplicated    Rehab Potential Fair    PT Frequency 2x / week    PT Duration 8 weeks    PT Treatment/Interventions ADLs/Self Care Home Management;Aquatic Therapy;Cryotherapy;Electrical Stimulation;Iontophoresis 4mg /ml Dexamethasone;Moist Heat;Ultrasound;Contrast Bath;DME Instruction;Gait training;Stair training;Functional mobility training;Therapeutic activities;Therapeutic exercise;Balance training;Neuromuscular re-education;Patient/family education;Manual techniques;Wheelchair mobility training;Energy conservation;Passive range of motion;Traction;Orthotic Fit/Training;Dry needling;Vestibular;Visual/perceptual remediation/compensation;Taping;Canalith Repostioning    PT Next Visit Plan core and pain relief    PT Home Exercise Plan [place] and Agree with Plan of Care Patient;Family member/caregiver    Family Member Consulted wife           Patient will benefit from skilled therapeutic intervention in order to improve the following deficits and impairments:  Abnormal gait, Decreased balance, Decreased coordination, Decreased mobility, Impaired tone, Postural dysfunction, Decreased strength, Decreased safety awareness, Improper body mechanics, Impaired flexibility, Decreased activity tolerance, Decreased endurance, Decreased knowledge of precautions, Difficulty walking, Pain, Cardiopulmonary status limiting activity, Decreased range of motion, Impaired perceived functional ability  Visit Diagnosis: Pain in right hip  Unsteadiness on feet  Muscle weakness (generalized)  Other abnormalities of gait and mobility     Problem List Patient Active Problem List   Diagnosis Date Noted  . E. coli bacteremia 02/23/2019  . Dysphagia 02/23/2019  .  Aspiration pneumonia ([place]) 02/21/2019  . Closed right hip fracture ([place]) 02/16/2019  . Rib pain on right side 09/25/2018  . Skin sore 12/01/2017  . Hematoma 10/18/2017  . Fall at home 10/18/2017  . 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 Radicular pain in right arm 10/09/2016  . GERD (gastroesophageal reflux disease) 07/31/2016  . Trochanteric bursitis 03/03/2016  . Left hand pain 10/30/2015  . Fracture, finger, multiple sites 10/30/2015  . Colon cancer screening 07/23/2015  . Healthcare maintenance 07/23/2015  . Gout 02/18/2015  . Depression 01/06/2015  . Irritation of eyelid 08/20/2014  . Dupuytren's contracture 08/20/2014  . Cough 07/21/2014  . Lumbar stenosis with neurogenic claudication 06/13/2014  . Preop cardiovascular exam 05/28/2014  . S/P deep brain stimulator placement 05/08/2014  . Joint pain 01/22/2014  . Medicare annual wellness visit, initial 01/19/2014  . Advance care planning 01/19/2014  . Parkinson's disease ([place]) 12/13/2013  . PTSD (post-traumatic stress disorder) 06/13/2013  . Erectile dysfunction 06/13/2013  . HLD (hyperlipidemia) 06/13/2013  . Hip pain 06/10/2013  . Pain in joint, shoulder region 06/10/2013  . Right leg swelling 06/03/2013  . Essential hypertension 06/03/2013  . Bradycardia by electrocardiogram 06/03/2013  . Obstructive sleep apnea 03/12/2013  . Spinal stenosis, lumbar region, with neurogenic claudication 12/14/2012   Janna Arch, PT, DPT   12/09/2019, 12:52 PM  [place] MAIN [facility] SERVICES [address] [place], [place], [address] Phone: [phone]   Fax:  [phone]  Name: LEWIN PELLOW MRN: [id number] Date of Birth: [birth year]

## 2019-12-10 NOTE — Progress Notes (Signed)
Cardiology Office Note:    Date:  12/11/2019   ID:  [name], DOB Jun 14, 1950, MRN [id number]  PCP:  Tonia Ghent, MD  [facility] Cardiologist:  Ida Rogue, MD  [facility] Electrophysiologist:  Vickie Epley, MD   Referring MD: Loel Dubonnet, NP   Chief Complaint  Patient presents with  . office visit    Referred by Laurann Montana for evaluationof bradycardia, NP; Meds verbally reviewed with patient.  Symptomatic bradycardia/junctional rhythm  History of Present Illness:    [name] is a 69 y.o. male with a hx of Parkinson's disease, HTN, GERD who presents to clinic for evaluation of symptomatic bradycardia. Recent Ziopatch monitor showed episodes of supraventricular tachycardia superimposed on a baseline of bradycardia. His baseline rate is in the 40s without much heart rate variability.   His wife is with him today and provided much of the history.  She tells me that his resting bradycardia has been present since his young adult years.  Prior to the diagnosis of his Parkinson's, his sinus bradycardia was asymptomatic.  He is now relatively sedentary and reports no specific symptoms attributable to bradycardia.  The brief episodes of tachycardia observed on the ZIO monitor are asymptomatic.  There is an upcoming procedure scheduled for his lumbar radiculopathy.  His wife tells me that without the deep brain stimulator for his Parkinson's, he would be "doubled over" from his muscle tremors.  Past Medical History:  Diagnosis Date  . Arthritis   . Bradycardia   . Cancer [facility]) 2013   skin cancer  . Depression    ptsd  . Dysrhythmia    chronic slow heart rate  . GERD (gastroesophageal reflux disease)   . Headache(784.0)    tension headaches non recent  . History of chicken pox   . History of kidney stones    passed  . Hypertension    treated with HCTZ  . Parkinson's disease ([place])    dx'ed 15 years ago  . PTSD (post-traumatic stress  disorder)   . Shortness of breath dyspnea   . Sleep apnea    doesn't use C-pap  . Varicose veins     Past Surgical History:  Procedure Laterality Date  . CHOLECYSTECTOMY N/A 10/22/2014   Procedure: LAPAROSCOPIC CHOLECYSTECTOMY WITH INTRAOPERATIVE CHOLANGIOGRAM;  Surgeon: Dia Crawford III, MD;  Location: [facility] ORS;  Service: General;  Laterality: N/A;  . cyst removed      from lip as a child  . INTRAMEDULLARY (IM) NAIL INTERTROCHANTERIC N/A 02/18/2019   Procedure: INTRAMEDULLARY (IM) NAIL INTERTROCHANTRIC, RIGHT,;  Surgeon: Thornton Park, MD;  Location: [facility] ORS;  Service: Orthopedics;  Laterality: N/A;  . LUMBAR LAMINECTOMY/DECOMPRESSION MICRODISCECTOMY Bilateral 12/14/2012   Procedure: Bilateral lumbar three-four, four-five decompressive laminotomy/foraminotomy;  Surgeon: Charlie Pitter, MD;  Location: [place] NEURO ORS;  Service: Neurosurgery;  Laterality: Bilateral;  . PULSE GENERATOR IMPLANT Bilateral 12/13/2013   Procedure: Bilateral implantable pulse generator placement;  Surgeon: Erline Levine, MD;  Location: [place] NEURO ORS;  Service: Neurosurgery;  Laterality: Bilateral;  Bilateral implantable pulse generator placement  . skin cancer removed     from ears,   12 lft arm  rt leg 15  . SUBTHALAMIC STIMULATOR BATTERY REPLACEMENT Bilateral 07/14/2017   Procedure: BILATERAL IMPLANTED PULSE GENERATOR CHANGE FOR DEEP BRAIN STIMULATOR;  Surgeon: Erline Levine, MD;  Location: [place];  Service: Neurosurgery;  Laterality: Bilateral;  . SUBTHALAMIC STIMULATOR INSERTION Bilateral 12/06/2013   Procedure: SUBTHALAMIC STIMULATOR INSERTION;  Surgeon: Erline Levine, MD;  Location: [place] NEURO ORS;  Service: Neurosurgery;  Laterality: Bilateral;  Bilateral deep brain stimulator placement    Current Medications: Current Meds  Medication Sig  . aspirin EC 81 MG tablet Take 1 tablet (81 mg total) by mouth daily. Swallow whole.  . carbidopa-levodopa (SINEMET IR) 25-100 MG tablet 2 tablets in AM, 1.5 tablets in afternoon,  and 1.5 tablets in the evening  . cholecalciferol (VITAMIN D) 1000 UNITS tablet Take 1,000 Units by mouth daily.  Marland Kitchen escitalopram (LEXAPRO) 10 MG tablet TAKE 1 TABLET DAILY  . hydrALAZINE (APRESOLINE) 25 MG tablet Take 1 tablet (25 mg total) by mouth as needed (For elevated blood pressure. Take if you have 2 consecutive readings that are one hour apart with systolic BP >509).  . Melatonin 10 MG TABS Take 10 mg by mouth at bedtime.  . memantine (NAMENDA) 10 MG tablet TAKE 1 TABLET TWICE A DAY  . omeprazole (PRILOSEC) 20 MG capsule TAKE 1 CAPSULE DAILY  . rivastigmine (EXELON) 9.5 mg/24hr 9.5 mg daily.  . traMADol-acetaminophen (ULTRACET) 37.5-325 MG tablet Take 1 tablet by mouth in the morning and at bedtime.     Allergies:   Clonazepam, Morphine, Morphine and related, and Prednisone   Social History   Socioeconomic History  . Marital status: Married    Spouse name: Not on file  . Number of children: Not on file  . Years of education: Not on file  . Highest education level: Not on file  Occupational History  . Occupation: disabled    Comment: 2001, [employer]    Comment: search and rescue helicopter  Tobacco Use  . Smoking status: Never Smoker  . Smokeless tobacco: Former Network engineer  . Vaping Use: Never used  Substance and Sexual Activity  . Alcohol use: Yes    Alcohol/week: 0.0 standard drinks    Comment: occasional (twice per month)  . Drug use: No  . Sexual activity: Not Currently  Other Topics Concern  . 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  Prev search and rescue work   3 kids from prev relationship.    Married 2014   Social Determinants of Health   Financial Resource Strain:   . Difficulty of Paying Living Expenses: Not on file  Food Insecurity:   . Worried About Charity fundraiser in the Last Year: Not on file  . Ran Out of Food in the Last Year: Not on file  Transportation Needs:   . Lack of  Transportation (Medical): Not on file  . Lack of Transportation (Non-Medical): Not on file  Physical Activity:   . Days of Exercise per Week: Not on file  . Minutes of Exercise per Session: Not on file  Stress:   . Feeling of Stress : Not on file  Social Connections:   . Frequency of Communication with Friends and Family: Not on file  . Frequency of Social Gatherings with Friends and Family: Not on file  . Attends Religious Services: Not on file  . Active Member of Clubs or Organizations: Not on file  . Attends Archivist Meetings: Not on file  . Marital Status: Not on file     Family History: The patient's family history includes Lung cancer in his father. There is no history of Colon cancer or Prostate cancer.  ROS:   Please see the history of present illness.     All other systems reviewed and  are negative.  EKGs/Labs/Other Studies Reviewed:    The following studies were reviewed today: ECG, zio, echo  11/14/2019 Echo 1. Left ventricular ejection fraction, by estimation, is 55%. The left  ventricle has normal function. The left ventricle has no regional wall  motion abnormalities. Left ventricular diastolic parameters are consistent  with Grade I diastolic dysfunction  (impaired relaxation).  2. Right ventricular systolic function is normal. The right ventricular  size is normal. Tricuspid regurgitation signal is inadequate for assessing  PA pressure.  3. The mitral valve is normal in structure. Mild mitral valve  regurgitation. No evidence of mitral stenosis.  4. The aortic valve was not well visualized. Aortic valve regurgitation  is mild. No aortic stenosis is present.  5. There is mild dilatation of the ascending aorta measuring 39 mm.   Zio monitor: baseline bradycardia with heart rates predominantly in the 40s.  There are multiple episodes of a supraventricular tachycardia that have visible p waves. These are likely ST vs AT.   EKG:  EKG is ordered  today.  The ekg ordered today demonstrates sinus bradycardia and artifact related to deep brain stimulator.  Recent Labs: 02/22/2019: Magnesium 2.2 10/02/2019: BUN 19; Creatinine, Ser 0.85; Hemoglobin 15.3; Platelets 197; Potassium 4.4; Sodium 140  Recent Lipid Panel    Component Value Date/Time   CHOL 140 10/04/2018 1039   CHOL 185 06/03/2013 1051   TRIG 152.0 (H) 10/04/2018 1039   HDL 33.60 (L) 10/04/2018 1039   HDL 35 (L) 06/03/2013 1051   CHOLHDL 4 10/04/2018 1039   VLDL 30.4 10/04/2018 1039   LDLCALC 76 10/04/2018 1039   LDLCALC 127 (H) 06/03/2013 1051    Physical Exam:    VS:  BP (!) 154/80 (BP Location: Left Arm, Patient Position: Sitting, Cuff Size: Normal)   Pulse (!) 44   Ht 5\' 9"  (1.753 m)   Wt 182 lb 8 oz (82.8 kg)   SpO2 98%   BMI 26.95 kg/m     Wt Readings from Last 3 Encounters:  12/11/19 182 lb 8 oz (82.8 kg)  11/21/19 180 lb (81.6 kg)  10/07/19 188 lb (85.3 kg)     GEN:  Awake alert HEENT: Normal NECK: No JVD; No carotid bruits LYMPHATICS: No lymphadenopathy CARDIAC: RRR, no murmurs, rubs, gallops RESPIRATORY:  Clear to auscultation without rales, wheezing or rhonchi  ABDOMEN: Soft, non-tender, non-distended MUSCULOSKELETAL:  No edema; No deformity  SKIN: Warm and dry NEUROLOGIC:  Alert and oriented x 3 PSYCHIATRIC:  Flat affect   ASSESSMENT:    1. Atrial tachycardia ([place])   2. Bradycardia    PLAN:    In order of problems listed above:  1. Sinus bradycardia/Supraventricular tachycardia Patient has a long history of sinus bradycardia dating back to his teenage years.  He has been asymptomatic with his bradycardia.  Recent Zio patch monitor shows resting heart rate in the 40s without significant variability.  I suspect this is mostly due to his anterior lifestyle and is not suggestive of a more pathologic etiology.   I had a long discussion with the patient and his wife today in clinic regarding the possible treatment options.  We specifically  discussed adding beta-blocker or calcium channel blocker to his regimen to control the rare supraventricular tachycardia episodes and the likelihood that this would also require a permanent pacemaker implant.  Given the asymptomatic nature of these episodes, the patient, his wife, and I mutually agreed that this was not an ideal choice.  I discussed longer-term monitoring  options including a loop recorder implant but I do not think this would be high yield given the degree of artifact seen on the twelve-lead EKG from his deep brain stimulator.  For now, I would favor a more conservative approach with close follow-up.  I do not think that his resting bradycardia should prohibit him from receiving therapy for his lumbar radiculopathy.  This has most likely been present for decades.  I will plan to see him back in clinic in approximately 6 months.  I will have him wear a ZIO 2-week monitor before our appointment to reassess the burden of supraventricular tachycardias and his resting heart rates.    Medication Adjustments/Labs and Tests Ordered: Current medicines are reviewed at length with the patient today.  Concerns regarding medicines are outlined above.  Orders Placed This Encounter  Procedures  . EKG 12-Lead   No orders of the defined types were placed in this encounter.   Patient Instructions  Medication Instructions:  Your physician recommends that you continue on your current medications as directed. Please refer to the Current Medication list given to you today.  Labwork: None ordered.  Testing/Procedures: Your physician has recommended that you wear a holter monitor. Holter monitors are medical devices that record the heart's electrical activity.   I will contact you in 5 months to have you come to the [place] office to have a ZIO heart monitor placed.   Follow-Up:  Your physician wants you to follow-up in: 6 months with Dr. Quentin Ore.     Any Other Special Instructions Will Be  Listed Below (If Applicable).  If you need a refill on your cardiac medications before your next appointment, please call your pharmacy.        Signed, Vickie Epley, MD  12/11/2019 1:27 PM    [facility]

## 2019-12-11 ENCOUNTER — Ambulatory Visit (INDEPENDENT_AMBULATORY_CARE_PROVIDER_SITE_OTHER): Payer: Medicare PPO | Admitting: Cardiology

## 2019-12-11 ENCOUNTER — Encounter: Payer: Self-pay | Admitting: Occupational Therapy

## 2019-12-11 ENCOUNTER — Ambulatory Visit: Payer: Medicare PPO | Admitting: Occupational Therapy

## 2019-12-11 ENCOUNTER — Ambulatory Visit: Payer: Medicare PPO

## 2019-12-11 ENCOUNTER — Other Ambulatory Visit: Payer: Self-pay

## 2019-12-11 ENCOUNTER — Encounter: Payer: Self-pay | Admitting: Cardiology

## 2019-12-11 VITALS — BP 154/80 | HR 44 | Ht 69.0 in | Wt 182.5 lb

## 2019-12-11 DIAGNOSIS — R2681 Unsteadiness on feet: Secondary | ICD-10-CM | POA: Diagnosis not present

## 2019-12-11 DIAGNOSIS — M25551 Pain in right hip: Secondary | ICD-10-CM

## 2019-12-11 DIAGNOSIS — R001 Bradycardia, unspecified: Secondary | ICD-10-CM | POA: Diagnosis not present

## 2019-12-11 DIAGNOSIS — M6281 Muscle weakness (generalized): Secondary | ICD-10-CM

## 2019-12-11 DIAGNOSIS — R2689 Other abnormalities of gait and mobility: Secondary | ICD-10-CM | POA: Diagnosis not present

## 2019-12-11 DIAGNOSIS — I471 Supraventricular tachycardia: Secondary | ICD-10-CM

## 2019-12-11 DIAGNOSIS — G20 Parkinson's disease: Secondary | ICD-10-CM

## 2019-12-11 DIAGNOSIS — R278 Other lack of coordination: Secondary | ICD-10-CM | POA: Diagnosis not present

## 2019-12-11 DIAGNOSIS — G8929 Other chronic pain: Secondary | ICD-10-CM | POA: Diagnosis not present

## 2019-12-11 DIAGNOSIS — R296 Repeated falls: Secondary | ICD-10-CM | POA: Diagnosis not present

## 2019-12-11 DIAGNOSIS — M47817 Spondylosis without myelopathy or radiculopathy, lumbosacral region: Secondary | ICD-10-CM | POA: Diagnosis not present

## 2019-12-11 DIAGNOSIS — M545 Low back pain: Secondary | ICD-10-CM | POA: Diagnosis not present

## 2019-12-11 NOTE — Patient Instructions (Addendum)
Medication Instructions:  Your physician recommends that you continue on your current medications as directed. Please refer to the Current Medication list given to you today.  Labwork: None ordered.  Testing/Procedures: Your physician has recommended that you wear a holter monitor. Holter monitors are medical devices that record the heart's electrical activity.   I will contact you in 5 months to have you come to the [place] office to have a ZIO heart monitor placed.   Follow-Up:  Your physician wants you to follow-up in: 6 months with Dr. Quentin Ore.     Any Other Special Instructions Will Be Listed Below (If Applicable).  If you need a refill on your cardiac medications before your next appointment, please call your pharmacy.

## 2019-12-11 NOTE — Therapy (Signed)
[place] [facility] SERVICES [address] [place], [place], [address] Phone: [phone]   Fax:  [phone]  Physical Therapy Treatment  Patient Details  Name: [name] MRN: [id number] Date of Birth: 21-Mar-1951 Referring Provider (PT): Sharolyn Douglas MD    Encounter Date: 12/11/2019   PT End of Session - 12/11/19 1010    Visit Number 25    Number of Visits 40    Date for PT Re-Evaluation 02/03/20    Authorization Time Period FOTO- PT    PT Start Time 1016    PT Stop Time 1059    PT Time Calculation (min) 43 min    Equipment Utilized During Treatment Gait belt    Activity Tolerance Patient tolerated treatment well;No increased pain;Patient limited by fatigue    Behavior During Therapy [facility] for tasks assessed/performed           Past Medical History:  Diagnosis Date  . Arthritis   . Bradycardia   . Cancer [facility]) 2013   skin cancer  . Depression    ptsd  . Dysrhythmia    chronic slow heart rate  . GERD (gastroesophageal reflux disease)   . Headache(784.0)    tension headaches non recent  . History of chicken pox   . History of kidney stones    passed  . Hypertension    treated with HCTZ  . Parkinson's disease ([place])    dx'ed 15 years ago  . PTSD (post-traumatic stress disorder)   . Shortness of breath dyspnea   . Sleep apnea    doesn't use C-pap  . Varicose veins     Past Surgical History:  Procedure Laterality Date  . CHOLECYSTECTOMY N/A 10/22/2014   Procedure: LAPAROSCOPIC CHOLECYSTECTOMY WITH INTRAOPERATIVE CHOLANGIOGRAM;  Surgeon: Dia Crawford III, MD;  Location: [facility] ORS;  Service: General;  Laterality: N/A;  . cyst removed      from lip as a child  . INTRAMEDULLARY (IM) NAIL INTERTROCHANTERIC N/A 02/18/2019   Procedure: INTRAMEDULLARY (IM) NAIL INTERTROCHANTRIC, RIGHT,;  Surgeon: Thornton Park, MD;  Location: [facility] ORS;  Service: Orthopedics;  Laterality: N/A;  . LUMBAR LAMINECTOMY/DECOMPRESSION MICRODISCECTOMY  Bilateral 12/14/2012   Procedure: Bilateral lumbar three-four, four-five decompressive laminotomy/foraminotomy;  Surgeon: Charlie Pitter, MD;  Location: [place] NEURO ORS;  Service: Neurosurgery;  Laterality: Bilateral;  . PULSE GENERATOR IMPLANT Bilateral 12/13/2013   Procedure: Bilateral implantable pulse generator placement;  Surgeon: Erline Levine, MD;  Location: [place] NEURO ORS;  Service: Neurosurgery;  Laterality: Bilateral;  Bilateral implantable pulse generator placement  . skin cancer removed     from ears,   12 lft arm  rt leg 15  . SUBTHALAMIC STIMULATOR BATTERY REPLACEMENT Bilateral 07/14/2017   Procedure: BILATERAL IMPLANTED PULSE GENERATOR CHANGE FOR DEEP BRAIN STIMULATOR;  Surgeon: Erline Levine, MD;  Location: [place];  Service: Neurosurgery;  Laterality: Bilateral;  . SUBTHALAMIC STIMULATOR INSERTION Bilateral 12/06/2013   Procedure: SUBTHALAMIC STIMULATOR INSERTION;  Surgeon: Erline Levine, MD;  Location: [place] NEURO ORS;  Service: Neurosurgery;  Laterality: Bilateral;  Bilateral deep brain stimulator placement    There were no vitals filed for this visit.   Subjective Assessment - 12/11/19 1102    Subjective Patient reports decreased pain. Had his first OT eval today. No falls or LOB since last session. Has been compliant with HEP.    Pertinent History [name] is a 69 y.o. male with a hx of HTN, bradycardia, Parkinson's s/p deep brain stimulator 11/2013 , HLD, depression, possible seizure  disorder, arthritis, depression/PTSD, dysrhythmia, GERD, sleep apnea. Patient was seen by this therapist in years past. Patient had a fall around Halloween of 2020 and admitted for ORIF on 02/18/19, was discharged to SNF and had [facility] around Christmas. Had home health therapy for a month and a half. After that tried to go to Outpatient Therapy for 3 sessions at [facility] and was not pleased due to how busy and crowded the location was. Fell last week in bathroom, didn't take walker in, falls about 2x month. Is  very weak in RLE. Patient has a new lift chair and U drive walking. Has a caregiver 4 days a week. Has additional order for radiculopathy, lumbar region. PMH of lumbar laminectomy/decompression microdiscectomy (L3-4, 4-5)  In 2014.  Lumbar 4-5 cage +rod (2016).    Limitations Walking;Standing;Sitting;House hold activities;Lifting    How long can you sit comfortably? not limited with a backrest, without a back rest 3 minutes    How long can you stand comfortably? 3-5 minutes with holding on.    Patient Stated Goals pain reduction of R hip. strength of legs, dance with wife. push lawnmower.    Currently in Pain? Yes    Pain Score 4     Pain Location Back   and knee   Pain Orientation Lower;Right;Left    Pain Descriptors / Indicators Aching    Pain Type Chronic pain    Pain Onset More than a month ago    Pain Frequency Intermittent                Manual:   supine:heat under back SAD with belt inferior glide 4x 30 second holds RLE SAD with belt with hip flexion  4x 30 seconds Hamstring lengthening 60 second holds x 2 trials each LE Popliteal angle lengthening 60 seconds each LE Piriformis stretch 2x 60 seconds each LE LE rotation 60 seconds x 3 trials STM with implementation of effleurage and ptrissage to lumbar paraspinal and quadratus musculature; multiple trigger points noted throughout region. X 13 minutes    TherEx:  Hooklying with heat under back:  Posterior pelvic tilt 15x 5 second holds Green swiss ball TrA activation press between hands and knees 15x 3 second holds Green swiss ball TrA activation with UE alternating raises 10x each UE,  Seated swiss ball TrA activation 10x 3 second  10x STS with focus on weight shift and safe body mechanics  Standing: -static stand with focus on weight shift on LLE and cues for gluteal squeeze and knee extension 60 seconds x 3 trials -static stand with horizontal head turns 10x, vertical head turns 10x -static stand with dynadisc  under RLE for weight shift onto LLE, min A for stabilization of L knee positioning x 60 seconds    Pt educated throughout session about proper posture and technique with exercises. Improved exercise technique, movement at target joints, use of target muscles after min to mod verbal, visual, tactile cues                      PT Education - 12/11/19 1010    Education provided Yes    Education Details manual, therex, education to aide on HEP    Person(s) Educated Patient;Caregiver(s)    Methods Explanation;Demonstration;Tactile cues;Verbal cues    Comprehension Verbalized understanding;Returned demonstration;Verbal cues required;Tactile cues required            PT Short Term Goals - 12/09/19 1247      PT SHORT TERM GOAL #  1   Title Pt will perform HEP with family's/aide's supervision, for improved balance, transfers, and gait.    Baseline 5/5: HEP given 6/28: HEP compliant 8/23: aide helps with HEP    Time 4    Period Weeks    Status Achieved    Target Date 09/19/19             PT Long Term Goals - 12/09/19 1248      PT LONG TERM GOAL #1   Title Patient (> 82 years old) will complete five times sit to stand test in < 15 seconds without UE support without LOB indicating an increased LE strength and improved balance.    Baseline 5/5: 19 seconds one posterior LOB 6/28: 12.32 no UE support    Time 8    Period Weeks    Status Achieved      PT LONG TERM GOAL #2   Title Patient will increase 10 meter walk test to >1.34m/s as to improve gait speed for better community ambulation and to reduce fall risk.    Baseline 4/4: 0.71 m/s with U step walker 6/28: 0.917 m/s with U step rollator 8/23: >1.0 m/s with rollator    Time 8    Period Weeks    Status Achieved      PT LONG TERM GOAL #3   Title Patient will increase Berg Balance score by > 6 points (32/56) to demonstrate decreased fall risk during functional activities.    Baseline 5/5: 26/56 6/28: 36/56    Time 8      Period Weeks    Status Achieved      PT LONG TERM GOAL #4   Title Patient will increase FOTO score to equal to or greater than 31/100  to demonstrate statistically significant improvement in mobility and quality of life.    Baseline 5/5: 12/100 6/28: 38% 8/23: 11.7    Time 8    Period Weeks    Status On-going    Target Date 02/03/20      PT LONG TERM GOAL #5   Title Patient will increase six minute walk test distance to >1000 for progression to community ambulator and improve gait ability    Baseline 6/28: 750 ft with U step rollator 8/23: 775 w rolling walker, increased crouch pattern of ambulation    Time 8    Period Weeks    Status Partially Met    Target Date 02/03/20      PT LONG TERM GOAL #6   Title Patient will increase Berg Balance score by > 6 points (42/56) to demonstrate decreased fall risk during functional activities.    Baseline 6/28: 36/56 8/23: 42/56    Time 8    Period Weeks    Status Partially Met    Target Date 02/03/20      PT LONG TERM GOAL #7   Title Patient will report a worst pain of 3/10 on VAS in low back to improve tolerance with ADLs and reduced symptoms with activities.    Baseline 8/9: 9/10 8/23: 7/10 pain    Time 8    Period Weeks    Status Partially Met    Target Date 02/03/20      PT LONG TERM GOAL #8   Title Patient will reduce modified Oswestry score to <20 as to demonstrate minimal disability with ADLs including improved sleeping tolerance, walking/sitting tolerance etc for better mobility with ADLs.    Baseline 8/9: 64% 8/23: 66%    Time  8    Period Weeks    Status On-going    Target Date 02/03/20                 Plan - 12/11/19 1233    Clinical Impression Statement Patient presents with his new aide who is educated on and given correction to placement of patient for stretches and Hep at home for carryover between sessions. Patient requires frequent visual, tactile, and verbal cueing for weight shift and acceptance onto  affected LLE with frequent crouch pattern of ambulation. Patient would benefit from skilled physical therapy to increase strength, stability, balance, and decrease pain to improve quality of life    Personal Factors and Comorbidities Age;Comorbidity 3+;Past/Current Experience;Time since onset of injury/illness/exacerbation;Transportation    Comorbidities athritis, bradycardia, cancer, depression, dysrhythmia, GERD, HTN, Parkinson's, PTSD, SOB, sleep apnea    Examination-Activity Limitations Bathing;Bed Mobility;Caring for Others;Bend;Dressing;Lift;Locomotion Level;Reach Overhead;Sit;Squat;Stairs;Stand;Toileting;Transfers    Examination-Participation Restrictions Church;Cleaning;Community Activity;Interpersonal Relationship;Shop;Volunteer;Yard Work;Other    Stability/Clinical Decision Making Stable/Uncomplicated    Rehab Potential Fair    PT Frequency 2x / week    PT Duration 8 weeks    PT Treatment/Interventions ADLs/Self Care Home Management;Aquatic Therapy;Cryotherapy;Electrical Stimulation;Iontophoresis 68m/ml Dexamethasone;Moist Heat;Ultrasound;Contrast Bath;DME Instruction;Gait training;Stair training;Functional mobility training;Therapeutic activities;Therapeutic exercise;Balance training;Neuromuscular re-education;Patient/family education;Manual techniques;Wheelchair mobility training;Energy conservation;Passive range of motion;Traction;Orthotic Fit/Training;Dry needling;Vestibular;Visual/perceptual remediation/compensation;Taping;Canalith Repostioning    PT Next Visit Plan core and pain relief    PT Home Exercise Plan [place] Agree with Plan of Care Patient;Family member/caregiver    Family Member Consulted wife           Patient will benefit from skilled therapeutic intervention in order to improve the following deficits and impairments:  Abnormal gait, Decreased balance, Decreased coordination, Decreased mobility, Impaired tone, Postural dysfunction, Decreased strength,  Decreased safety awareness, Improper body mechanics, Impaired flexibility, Decreased activity tolerance, Decreased endurance, Decreased knowledge of precautions, Difficulty walking, Pain, Cardiopulmonary status limiting activity, Decreased range of motion, Impaired perceived functional ability  Visit Diagnosis: Pain in right hip  Unsteadiness on feet  Muscle weakness (generalized)  Other abnormalities of gait and mobility     Problem List Patient Active Problem List   Diagnosis Date Noted  . E. coli bacteremia 02/23/2019  . Dysphagia 02/23/2019  . Aspiration pneumonia ([facility] 02/21/2019  . Closed right hip fracture ([facility] 02/16/2019  . Rib pain on right side 09/25/2018  . Skin sore 12/01/2017  . Hematoma 10/18/2017  . Fall at home 10/18/2017  . 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 Radicular pain in right arm 10/09/2016  . GERD (gastroesophageal reflux disease) 07/31/2016  . Trochanteric bursitis 03/03/2016  . Left hand pain 10/30/2015  . Fracture, finger, multiple sites 10/30/2015  . Colon cancer screening 07/23/2015  . Healthcare maintenance 07/23/2015  . Gout 02/18/2015  . Depression 01/06/2015  . Irritation of eyelid 08/20/2014  . Dupuytren's contracture 08/20/2014  . Cough 07/21/2014  . Lumbar stenosis with neurogenic claudication 06/13/2014  . Preop cardiovascular exam 05/28/2014  . S/P deep brain stimulator placement 05/08/2014  . Joint pain 01/22/2014  . Medicare annual wellness visit, initial 01/19/2014  . Advance care planning 01/19/2014  . Parkinson's disease ([facility] 12/13/2013  . PTSD (post-traumatic stress disorder) 06/13/2013  . Erectile dysfunction 06/13/2013  . HLD (hyperlipidemia) 06/13/2013  . Hip pain 06/10/2013  . Pain in joint, shoulder region 06/10/2013  . Right leg swelling 06/03/2013  . Essential hypertension 06/03/2013  . Bradycardia by electrocardiogram 06/03/2013  . Obstructive sleep apnea 03/12/2013  . Spinal stenosis,  lumbar region, with  neurogenic claudication 12/14/2012   Janna Arch, PT, DPT   12/11/2019, 12:36 PM  [place] [facility] SERVICES [address] [place], [place], [address] Phone: [phone]   Fax:  [phone]  Name: [name] MRN: [id number] Date of Birth: [birth year]

## 2019-12-13 NOTE — Therapy (Signed)
[place] MAIN [facility] SERVICES [address] [place], [place], [address] Phone: [phone]   Fax:  [phone]  Occupational Therapy Evaluation  Patient Details  Name: [name] MRN: [id number] Date of Birth: 02-11-1951 No data recorded  Encounter Date: 12/11/2019   OT End of Session - 12/13/19 2043    Visit Number 1    Number of Visits 24    Date for OT Re-Evaluation 03/06/20    Authorization Type reporting period starting 12/11/2019    OT Start Time 0915    OT Stop Time 1014    OT Time Calculation (min) 59 min    Activity Tolerance Patient tolerated treatment well    Behavior During Therapy [facility] for tasks assessed/performed           Past Medical History:  Diagnosis Date  . Arthritis   . Bradycardia   . Cancer [facility]) 2013   skin cancer  . Depression    ptsd  . Dysrhythmia    chronic slow heart rate  . GERD (gastroesophageal reflux disease)   . Headache(784.0)    tension headaches non recent  . History of chicken pox   . History of kidney stones    passed  . Hypertension    treated with HCTZ  . Parkinson's disease ([place])    dx'ed 15 years ago  . PTSD (post-traumatic stress disorder)   . Shortness of breath dyspnea   . Sleep apnea    doesn't use C-pap  . Varicose veins     Past Surgical History:  Procedure Laterality Date  . CHOLECYSTECTOMY N/A 10/22/2014   Procedure: LAPAROSCOPIC CHOLECYSTECTOMY WITH INTRAOPERATIVE CHOLANGIOGRAM;  Surgeon: Dia Crawford III, MD;  Location: [facility] ORS;  Service: General;  Laterality: N/A;  . cyst removed      from lip as a child  . INTRAMEDULLARY (IM) NAIL INTERTROCHANTERIC N/A 02/18/2019   Procedure: INTRAMEDULLARY (IM) NAIL INTERTROCHANTRIC, RIGHT,;  Surgeon: Thornton Park, MD;  Location: [facility] ORS;  Service: Orthopedics;  Laterality: N/A;  . LUMBAR LAMINECTOMY/DECOMPRESSION MICRODISCECTOMY Bilateral 12/14/2012   Procedure: Bilateral lumbar three-four, four-five decompressive  laminotomy/foraminotomy;  Surgeon: Charlie Pitter, MD;  Location: [place] NEURO ORS;  Service: Neurosurgery;  Laterality: Bilateral;  . PULSE GENERATOR IMPLANT Bilateral 12/13/2013   Procedure: Bilateral implantable pulse generator placement;  Surgeon: Erline Levine, MD;  Location: [place] NEURO ORS;  Service: Neurosurgery;  Laterality: Bilateral;  Bilateral implantable pulse generator placement  . skin cancer removed     from ears,   12 lft arm  rt leg 15  . SUBTHALAMIC STIMULATOR BATTERY REPLACEMENT Bilateral 07/14/2017   Procedure: BILATERAL IMPLANTED PULSE GENERATOR CHANGE FOR DEEP BRAIN STIMULATOR;  Surgeon: Erline Levine, MD;  Location: [place];  Service: Neurosurgery;  Laterality: Bilateral;  . SUBTHALAMIC STIMULATOR INSERTION Bilateral 12/06/2013   Procedure: SUBTHALAMIC STIMULATOR INSERTION;  Surgeon: Erline Levine, MD;  Location: [place] NEURO ORS;  Service: Neurosurgery;  Laterality: Bilateral;  Bilateral deep brain stimulator placement    There were no vitals filed for this visit.   Subjective Assessment - 12/13/19 2027    Subjective  Patient reports he is starting to clinch his hands more, sometimes to the point of making white marks, dropping of silverware with self feeding.    Pertinent History Pt. is a 68 yo male who has a history of Parkinson's Disease. Pt. has a Deep Brain Stimulator in place. Pt. has a history of Low Back Pain with 2 back surgeries in August 2014, and February 2016. Pt.  has a history of multiple frequent falls, unsure of how many falls in the last 6 months.    Limitations Frequent falls, limited motor control, and [place].     Patient Stated Goals Pt would like to be more independent daily.    Currently in Pain? Yes    Pain Score 4     Pain Location Back    Pain Orientation Right;Left;Lower    Pain Descriptors / Indicators Aching    Pain Onset More than a month ago             [facility] OT Assessment - 12/13/19 2028      Assessment   Medical Diagnosis Parkinson's Disease    Hand  Dominance Right    Prior Therapy yes      Precautions   Precautions Fall;Other (comment)      Restrictions   Weight Bearing Restrictions No      Balance Screen   Has the patient fallen in the past 6 months Yes    How many times? many falls, 2 in the last week.    Has the patient had a decrease in activity level because of a fear of falling?  Yes      Home  Environment   Family/patient expects to be discharged to: Private residence    Living Arrangements Spouse/significant other    Available Help at Discharge Family    Type of [place] One level    Alternate Level Stairs - Number of Steps 3    Bathroom Shower/Tub Walk-in Shower    Bathroom Toilet Handicapped height    Home Equipment Shower seat    Lives With Spouse      Prior Function   Vocation Retired    Leisure Previously Acupuncturist,       ADL   Eating/Feeding Needs assist with cutting food    Grooming Minimal assistance    Upper Body Bathing Modified independent    Lower Body Bathing Modified independent    Upper Body Dressing Increased time;Needs assist for fasteners    Lower Body Dressing Increased time;Minimal assistance    Toilet Transfer Modified independent    Toileting - Clothing Manipulation Minimal assistance;Increased time    Tub/Shower Transfer Supervision/safety    ADL comments Patient has difficulty with managing silverware, dropping frequently.  Requires assist with cutting meat and food on plate.  Difficulty with using a keyboard, IPAD and iphone.  Difficulty with donning watch.  Assist for shaving.  Patient has difficulty with standing in the kitchen for any tasks, decreased safety and balance.  Patient has personal care aide for Monday thru Friday for 4 hours except wednesdays and has her for 6 hours.  Patient requires assist with dressing skills and managing buttons, zippers and snaps.        IADL   Shopping Completely unable to shop    Prior Level  of Function Light Housekeeping independent    Light Housekeeping Needs help with all home maintenance tasks    Prior Level of Function Meal Prep independent    Meal Prep Needs to have meals prepared and served    [employer] on family or friends for transportation    Medication Management Is not capable of dispensing or managing own medication    Financial Management Dependent      Mobility   Mobility Status History of falls;Needs assist    Mobility Status Comments U walker  Written Expression   Dominant Hand Right      Vision - History   Baseline Vision Wears glasses all the time      Vision Assessment   Comment has prisms in his glasses      Cognition   Overall Cognitive Status Impaired/Different from baseline    Area of Impairment Memory;Safety/judgement;Problem solving    Memory Impaired    Problem Solving Impaired      Observation/Other Assessments   Focus on Therapeutic Outcomes (FOTO)  40      Sensation   Light Touch Appears Intact    Stereognosis Appears Intact    Hot/Cold Appears Intact      Coordination   Gross Motor Movements are Fluid and Coordinated No    Fine Motor Movements are Fluid and Coordinated No    Right 9 Hole Peg Test 52    Left 9 Hole Peg Test 35      ROM / Strength   AROM / PROM / Strength AROM;Strength      AROM   Overall AROM Comments AROM shoulder flexion WNLs      Strength   Overall Strength Comments 4/5 overall shoulder and elbow strength      Hand Function   Right Hand Grip (lbs) 60    Right Hand Lateral Pinch 16 lbs    Right Hand 3 Point Pinch 14 lbs    Left Hand Grip (lbs) 70    Left Hand Lateral Pinch 18 lbs    Left 3 point pinch 17 lbs           Patient has difficulty with managing silverware, dropping frequently.  Requires assist with cutting meat and food on plate.  Difficulty with using a keyboard, IPAD and iphone.  Difficulty with donning watch.  Assist for shaving.  Patient has difficulty with  standing in the kitchen for any tasks, decreased safety and balance.  Patient has personal care aide for Monday thru Friday for 4 hours except wednesdays and has her for 6 hours.  Patient requires assist with dressing skills and managing buttons, zippers and snaps.    Bilateral shoulder flexion to 130 degrees, ABD WFLs , pain 4/10 with ABD.   Able to demonstrate full fisting bilaterally., full opposition and full motion of elbow and wrist.    Handwriting fair to poor and decreased legibility especially with script writing, improved with printing.                       OT Long Term Goals - 12/13/19 2046      OT LONG TERM GOAL #1   Title Pt. will improve Bilateral UE strength by 1 mm grade to assist with ADLs, and IADLs    Baseline BUE strength 4/5 overall at eval    Time 12    Period Weeks    Status New    Target Date 03/06/20      OT LONG TERM GOAL #2   Title Pt. will improve grip strength by 10# to open jars and containers.    Baseline difficulty at eval    Time 12    Period Weeks    Status New    Target Date 03/06/20      OT LONG TERM GOAL #3   Title Patient will complete lower body dressing with modified independence.    Baseline min to mod assist at eval    Time 12    Period Weeks    Status New  Target Date 03/06/20      OT LONG TERM GOAL #4   Title Patient to demonstrate the ability to vacuum flooring at home with use of modified techniques with supervision.    Baseline difficulty at eval    Time 12    Period Weeks    Status New    Target Date 03/06/20      OT LONG TERM GOAL #5   Title Patient will demonstrate the ability to retrieve a snack and transport it to the living room with modified independence.    Baseline unable at eval    Time 12    Period Weeks    Status New    Target Date 03/06/20      OT LONG TERM GOAL #6   Title Patient will improve hand function to be able to perform cutting of food with modified independence.    Baseline  wife helps to cut food when needed.    Time 8    Period Weeks    Status New    Target Date 02/05/20      OT LONG TERM GOAL #7   Title Pt. will write a paragraph efficiently with 90% legibility.    Baseline decreased legibility and poor hand endurance for writing more than 1 sentence.    Time 8    Period Weeks    Status New    Target Date 02/05/20      OT LONG TERM GOAL  #10   TITLE Pt will don and doff pants with supervision    Baseline min assist to manage pants    Time 12    Period Weeks    Status New    Target Date 03/06/20                 Plan - 12/13/19 2045    Clinical Impression Statement Patient is a 69 yo male with Parkinson's disease who was seen in this clinic in the fall of last year.  He suffered a fall and was hospitalized and had therapy at the hospital, home and now referred for outpatient OT evaluation.  Patient presents with muscle weakness, lack of coordination, decreased memory/cognition, decreased balance and safety, and decreased ability to perform ADL and IADL tasks. He would benefit from skilled OT services to maximize safety and independence in necessary daily tasks.    OT Occupational Profile and History Detailed Assessment- Review of Records and additional review of physical, cognitive, psychosocial history related to current functional performance    Occupational performance deficits (Please refer to evaluation for details): ADL's;IADL's;Leisure    Body Structure / Function / Physical Skills ADL;Coordination;Endurance;GMC;UE functional use;Balance;Decreased knowledge of use of DME;IADL;Pain;Dexterity;FMC;Strength;Mobility;ROM    Cognitive Skills Attention;Problem Solve;Memory    Psychosocial Skills Coping Strategies;Environmental  Adaptations;Habits;Routines and Behaviors    Rehab Potential Good    Clinical Decision Making Limited treatment options, no task modification necessary    Comorbidities Affecting Occupational Performance: Presence of  comorbidities impacting occupational performance    Comorbidities impacting occupational performance description: risk for falls, slow to respond and process information at times, safety    Modification or Assistance to Complete Evaluation  No modification of tasks or assist necessary to complete eval    OT Frequency 2x / week    OT Duration 12 weeks    OT Treatment/Interventions Self-care/ADL training;Therapeutic exercise;DME and/or AE instruction;Energy conservation;Neuromuscular education;Patient/family education;Therapeutic activities;Moist Heat;Functional Mobility Training;Cognitive remediation/compensation    Consulted and Agree with Plan of Care Patient  Patient will benefit from skilled therapeutic intervention in order to improve the following deficits and impairments:   Body Structure / Function / Physical Skills: ADL, Coordination, Endurance, GMC, UE functional use, Balance, Decreased knowledge of use of DME, IADL, Pain, Dexterity, FMC, Strength, Mobility, ROM Cognitive Skills: Attention, Problem Solve, Memory Psychosocial Skills: Coping Strategies, Environmental  Adaptations, Habits, Routines and Behaviors   Visit Diagnosis: Muscle weakness (generalized)  Other lack of coordination  Parkinson's disease ([place])  Unsteadiness on feet    Problem List Patient Active Problem List   Diagnosis Date Noted  . E. coli bacteremia 02/23/2019  . Dysphagia 02/23/2019  . Aspiration pneumonia ([place]) 02/21/2019  . Closed right hip fracture ([place]) 02/16/2019  . Rib pain on right side 09/25/2018  . Skin sore 12/01/2017  . Hematoma 10/18/2017  . Fall at home 10/18/2017  . 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 Radicular pain in right arm 10/09/2016  . GERD (gastroesophageal reflux disease) 07/31/2016  . Trochanteric bursitis 03/03/2016  . Left hand pain 10/30/2015  . Fracture, finger, multiple sites 10/30/2015  . Colon cancer screening 07/23/2015  . Healthcare maintenance  07/23/2015  . Gout 02/18/2015  . Depression 01/06/2015  . Irritation of eyelid 08/20/2014  . Dupuytren's contracture 08/20/2014  . Cough 07/21/2014  . Lumbar stenosis with neurogenic claudication 06/13/2014  . Preop cardiovascular exam 05/28/2014  . S/P deep brain stimulator placement 05/08/2014  . Joint pain 01/22/2014  . Medicare annual wellness visit, initial 01/19/2014  . Advance care planning 01/19/2014  . Parkinson's disease ([place]) 12/13/2013  . PTSD (post-traumatic stress disorder) 06/13/2013  . Erectile dysfunction 06/13/2013  . HLD (hyperlipidemia) 06/13/2013  . Hip pain 06/10/2013  . Pain in joint, shoulder region 06/10/2013  . Right leg swelling 06/03/2013  . Essential hypertension 06/03/2013  . Bradycardia by electrocardiogram 06/03/2013  . Obstructive sleep apnea 03/12/2013  . Spinal stenosis, lumbar region, with neurogenic claudication 12/14/2012   Achilles Dunk, OTR/L, CLT  [name] 12/13/2019, 8:54 PM  [place] MAIN [facility] SERVICES [address] [place], [place], [address] Phone: [phone]   Fax:  [phone]  Name: [name] MRN: [id number] Date of Birth: 01/01/1951

## 2019-12-16 ENCOUNTER — Ambulatory Visit: Payer: Medicare PPO

## 2019-12-16 ENCOUNTER — Ambulatory Visit: Payer: Medicare PPO | Admitting: Occupational Therapy

## 2019-12-16 ENCOUNTER — Other Ambulatory Visit: Payer: Self-pay

## 2019-12-16 ENCOUNTER — Encounter: Payer: Self-pay | Admitting: Occupational Therapy

## 2019-12-16 DIAGNOSIS — R2681 Unsteadiness on feet: Secondary | ICD-10-CM | POA: Diagnosis not present

## 2019-12-16 DIAGNOSIS — R278 Other lack of coordination: Secondary | ICD-10-CM

## 2019-12-16 DIAGNOSIS — G20 Parkinson's disease: Secondary | ICD-10-CM

## 2019-12-16 DIAGNOSIS — R296 Repeated falls: Secondary | ICD-10-CM | POA: Diagnosis not present

## 2019-12-16 DIAGNOSIS — M6281 Muscle weakness (generalized): Secondary | ICD-10-CM | POA: Diagnosis not present

## 2019-12-16 DIAGNOSIS — M25551 Pain in right hip: Secondary | ICD-10-CM

## 2019-12-16 DIAGNOSIS — G8929 Other chronic pain: Secondary | ICD-10-CM | POA: Diagnosis not present

## 2019-12-16 DIAGNOSIS — M545 Low back pain: Secondary | ICD-10-CM | POA: Diagnosis not present

## 2019-12-16 DIAGNOSIS — R2689 Other abnormalities of gait and mobility: Secondary | ICD-10-CM | POA: Diagnosis not present

## 2019-12-16 NOTE — Therapy (Signed)
[place] MAIN [facility] SERVICES [address] [place], [place], [address] Phone: [phone]   Fax:  [phone]  Physical Therapy Treatment  Patient Details  Name: [name] MRN: [id number] Date of Birth: [birth year] Referring Provider (PT): Sharolyn Douglas MD    Encounter Date: 12/16/2019   PT End of Session - 12/16/19 1008    Visit Number 26    Number of Visits 40    Date for PT Re-Evaluation 02/03/20    Authorization Type 6/10 PN 8/9    Authorization Time Period FOTO- PT    PT Start Time 1015    PT Stop Time 1059    PT Time Calculation (min) 44 min    Equipment Utilized During Treatment Gait belt    Activity Tolerance Patient tolerated treatment well;No increased pain;Patient limited by fatigue    Behavior During Therapy [facility] for tasks assessed/performed           Past Medical History:  Diagnosis Date  . Arthritis   . Bradycardia   . Cancer [facility]) 2013   skin cancer  . Depression    ptsd  . Dysrhythmia    chronic slow heart rate  . GERD (gastroesophageal reflux disease)   . Headache(784.0)    tension headaches non recent  . History of chicken pox   . History of kidney stones    passed  . Hypertension    treated with HCTZ  . Parkinson's disease ([place])    dx'ed 15 years ago  . PTSD (post-traumatic stress disorder)   . Shortness of breath dyspnea   . Sleep apnea    doesn't use C-pap  . Varicose veins     Past Surgical History:  Procedure Laterality Date  . CHOLECYSTECTOMY N/A 10/22/2014   Procedure: LAPAROSCOPIC CHOLECYSTECTOMY WITH INTRAOPERATIVE CHOLANGIOGRAM;  Surgeon: Dia Crawford III, MD;  Location: [facility] ORS;  Service: General;  Laterality: N/A;  . cyst removed      from lip as a child  . INTRAMEDULLARY (IM) NAIL INTERTROCHANTERIC N/A 02/18/2019   Procedure: INTRAMEDULLARY (IM) NAIL INTERTROCHANTRIC, RIGHT,;  Surgeon: Thornton Park, MD;  Location: [facility] ORS;  Service: Orthopedics;  Laterality: N/A;  . LUMBAR  LAMINECTOMY/DECOMPRESSION MICRODISCECTOMY Bilateral 12/14/2012   Procedure: Bilateral lumbar three-four, four-five decompressive laminotomy/foraminotomy;  Surgeon: Charlie Pitter, MD;  Location: [place] NEURO ORS;  Service: Neurosurgery;  Laterality: Bilateral;  . PULSE GENERATOR IMPLANT Bilateral 12/13/2013   Procedure: Bilateral implantable pulse generator placement;  Surgeon: Erline Levine, MD;  Location: [place] NEURO ORS;  Service: Neurosurgery;  Laterality: Bilateral;  Bilateral implantable pulse generator placement  . skin cancer removed     from ears,   12 lft arm  rt leg 15  . SUBTHALAMIC STIMULATOR BATTERY REPLACEMENT Bilateral 07/14/2017   Procedure: BILATERAL IMPLANTED PULSE GENERATOR CHANGE FOR DEEP BRAIN STIMULATOR;  Surgeon: Erline Levine, MD;  Location: [place];  Service: Neurosurgery;  Laterality: Bilateral;  . SUBTHALAMIC STIMULATOR INSERTION Bilateral 12/06/2013   Procedure: SUBTHALAMIC STIMULATOR INSERTION;  Surgeon: Erline Levine, MD;  Location: [place] NEURO ORS;  Service: Neurosurgery;  Laterality: Bilateral;  Bilateral deep brain stimulator placement    There were no vitals filed for this visit.   Subjective Assessment - 12/16/19 1254    Subjective Patient went to the cardiac doctor and will get another monitor in 5 months. Went to the [place] on Thursday as well. Is having some swelling of L arm.    Pertinent History [name] is a 69 y.o. male with a  hx of HTN, bradycardia, Parkinson's s/p deep brain stimulator 11/2013 , HLD, depression, possible seizure disorder, arthritis, depression/PTSD, dysrhythmia, GERD, sleep apnea. Patient was seen by this therapist in years past. Patient had a fall around Halloween of 2020 and admitted for ORIF on 02/18/19, was discharged to SNF and had [place] around Christmas. Had home health therapy for a month and a half. After that tried to go to Outpatient Therapy for 3 sessions at [facility] and was not pleased due to how busy and crowded the location was. Fell last  week in bathroom, didn't take walker in, falls about 2x month. Is very weak in RLE. Patient has a new lift chair and U drive walking. Has a caregiver 4 days a week. Has additional order for radiculopathy, lumbar region. PMH of lumbar laminectomy/decompression microdiscectomy (L3-4, 4-5)  In 2014.  Lumbar 4-5 cage +rod (2016).    Limitations Walking;Standing;Sitting;House hold activities;Lifting    How long can you sit comfortably? not limited with a backrest, without a back rest 3 minutes    How long can you stand comfortably? 3-5 minutes with holding on.    Patient Stated Goals pain reduction of R hip. strength of legs, dance with wife. push lawnmower.    Currently in Pain? Yes    Pain Score 3     Pain Location Back    Pain Orientation Lower    Pain Descriptors / Indicators Aching    Pain Type Chronic pain    Pain Onset More than a month ago    Pain Frequency Intermittent                Bp at start of session: 162/62 ; pulse 43    Manual:   supine: heat under back SAD with belt inferior glide 4x 30 second holds RLE SAD with belt with hip flexion 4x 30 seconds Hamstring lengthening 60 second holds x 2 trials each LE Popliteal angle lengthening 60 seconds each LE Piriformis stretch 2x 60 seconds each LE LE rotation 60 seconds x 3 trials STM with implementation of effleurage and ptrissage to lumbar paraspinal and quadratus musculature; multiple trigger points noted throughout region. X 13 minutes    TherEx:  Hooklying with heat under back:  Posterior pelvic tilt 15x 5 second holds Green swiss ball TrA activation press between hands and knees 15x 3 second holds Green swiss ball TrA activation with UE alternating raises 10x each UE,  3lb ankle weight:  -marching 15x each LE cues for core activation -SLR 10x each LE with cues for quad activation -straight leg abduction 10x each side each LE  Seated:  swiss ball TrA activation 10x 3 second  10x STS with focus on weight shift  and safe body mechanics  Standing: -static stand with focus on weight shift on LLE and cues for gluteal squeeze and knee extension 60 seconds x 3 trials       Pt educated throughout session about proper posture and technique with exercises. Improved exercise technique, movement at target joints, use of target muscles after min to mod verbal, visual, tactile cues                      PT Education - 12/16/19 1007    Education provided Yes    Education Details exercise technique, body mechanics    Person(s) Educated Patient    Methods Explanation;Demonstration;Tactile cues;Verbal cues    Comprehension Verbalized understanding;Returned demonstration;Verbal cues required;Tactile cues required  PT Short Term Goals - 12/09/19 1247      PT SHORT TERM GOAL #1   Title Pt will perform HEP with family's/aide's supervision, for improved balance, transfers, and gait.    Baseline 5/5: HEP given 6/28: HEP compliant 8/23: aide helps with HEP    Time 4    Period Weeks    Status Achieved    Target Date 09/19/19             PT Long Term Goals - 12/09/19 1248      PT LONG TERM GOAL #1   Title Patient (> 78 years old) will complete five times sit to stand test in < 15 seconds without UE support without LOB indicating an increased LE strength and improved balance.    Baseline 5/5: 19 seconds one posterior LOB 6/28: 12.32 no UE support    Time 8    Period Weeks    Status Achieved      PT LONG TERM GOAL #2   Title Patient will increase 10 meter walk test to >1.11ms as to improve gait speed for better community ambulation and to reduce fall risk.    Baseline 4/4: 0.71 m/s with U step walker 6/28: 0.917 m/s with U step rollator 8/23: >1.0 m/s with rollator    Time 8    Period Weeks    Status Achieved      PT LONG TERM GOAL #3   Title Patient will increase Berg Balance score by > 6 points (32/56) to demonstrate decreased fall risk during functional activities.     Baseline 5/5: 26/56 6/28: 36/56    Time 8    Period Weeks    Status Achieved      PT LONG TERM GOAL #4   Title Patient will increase FOTO score to equal to or greater than 31/100  to demonstrate statistically significant improvement in mobility and quality of life.    Baseline 5/5: 12/100 6/28: 38% 8/23: 11.7    Time 8    Period Weeks    Status On-going    Target Date 02/03/20      PT LONG TERM GOAL #5   Title Patient will increase six minute walk test distance to >1000 for progression to community ambulator and improve gait ability    Baseline 6/28: 750 ft with U step rollator 8/23: 775 w rolling walker, increased crouch pattern of ambulation    Time 8    Period Weeks    Status Partially Met    Target Date 02/03/20      PT LONG TERM GOAL #6   Title Patient will increase Berg Balance score by > 6 points (42/56) to demonstrate decreased fall risk during functional activities.    Baseline 6/28: 36/56 8/23: 42/56    Time 8    Period Weeks    Status Partially Met    Target Date 02/03/20      PT LONG TERM GOAL #7   Title Patient will report a worst pain of 3/10 on VAS in low back to improve tolerance with ADLs and reduced symptoms with activities.    Baseline 8/9: 9/10 8/23: 7/10 pain    Time 8    Period Weeks    Status Partially Met    Target Date 02/03/20      PT LONG TERM GOAL #8   Title Patient will reduce modified Oswestry score to <20 as to demonstrate minimal disability with ADLs including improved sleeping tolerance, walking/sitting tolerance etc for better  mobility with ADLs.    Baseline 8/9: 64% 8/23: 66%    Time 8    Period Weeks    Status On-going    Target Date 02/03/20                 Plan - 12/16/19 1311    Clinical Impression Statement Patient progressing with functional strength with decreased pain levels with functional mobility. Patient requires cueing for task orientation throughout session due to patient "blanking" requiring re-orientation.  Patient reports decreased pain by end of session. Patient would benefit from skilled physical therapy to increase strength, stability, balance, and decrease pain to improve quality of life    Personal Factors and Comorbidities Age;Comorbidity 3+;Past/Current Experience;Time since onset of injury/illness/exacerbation;Transportation    Comorbidities athritis, bradycardia, cancer, depression, dysrhythmia, GERD, HTN, Parkinson's, PTSD, SOB, sleep apnea    Examination-Activity Limitations Bathing;Bed Mobility;Caring for Others;Bend;Dressing;Lift;Locomotion Level;Reach Overhead;Sit;Squat;Stairs;Stand;Toileting;Transfers    Examination-Participation Restrictions Church;Cleaning;Community Activity;Interpersonal Relationship;Shop;Volunteer;Yard Work;Other    Stability/Clinical Decision Making Stable/Uncomplicated    Rehab Potential Fair    PT Frequency 2x / week    PT Duration 8 weeks    PT Treatment/Interventions ADLs/Self Care Home Management;Aquatic Therapy;Cryotherapy;Electrical Stimulation;Iontophoresis 57m/ml Dexamethasone;Moist Heat;Ultrasound;Contrast Bath;DME Instruction;Gait training;Stair training;Functional mobility training;Therapeutic activities;Therapeutic exercise;Balance training;Neuromuscular re-education;Patient/family education;Manual techniques;Wheelchair mobility training;Energy conservation;Passive range of motion;Traction;Orthotic Fit/Training;Dry needling;Vestibular;Visual/perceptual remediation/compensation;Taping;Canalith Repostioning    PT Next Visit Plan core and pain relief    PT Home Exercise Plan [place] Agree with Plan of Care Patient;Family member/caregiver    Family Member Consulted wife           Patient will benefit from skilled therapeutic intervention in order to improve the following deficits and impairments:  Abnormal gait, Decreased balance, Decreased coordination, Decreased mobility, Impaired tone, Postural dysfunction, Decreased strength,  Decreased safety awareness, Improper body mechanics, Impaired flexibility, Decreased activity tolerance, Decreased endurance, Decreased knowledge of precautions, Difficulty walking, Pain, Cardiopulmonary status limiting activity, Decreased range of motion, Impaired perceived functional ability  Visit Diagnosis: Muscle weakness (generalized)  Other lack of coordination  Parkinson's disease (HCC)  Unsteadiness on feet  Pain in right hip     Problem List Patient Active Problem List   Diagnosis Date Noted  . E. coli bacteremia 02/23/2019  . Dysphagia 02/23/2019  . Aspiration pneumonia ([place] 02/21/2019  . Closed right hip fracture ([place] 02/16/2019  . Rib pain on right side 09/25/2018  . Skin sore 12/01/2017  . Hematoma 10/18/2017  . Fall at home 10/18/2017  . 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 Radicular pain in right arm 10/09/2016  . GERD (gastroesophageal reflux disease) 07/31/2016  . Trochanteric bursitis 03/03/2016  . Left hand pain 10/30/2015  . Fracture, finger, multiple sites 10/30/2015  . Colon cancer screening 07/23/2015  . Healthcare maintenance 07/23/2015  . Gout 02/18/2015  . Depression 01/06/2015  . Irritation of eyelid 08/20/2014  . Dupuytren's contracture 08/20/2014  . Cough 07/21/2014  . Lumbar stenosis with neurogenic claudication 06/13/2014  . Preop cardiovascular exam 05/28/2014  . S/P deep brain stimulator placement 05/08/2014  . Joint pain 01/22/2014  . Medicare annual wellness visit, initial 01/19/2014  . Advance care planning 01/19/2014  . Parkinson's disease ([place] 12/13/2013  . PTSD (post-traumatic stress disorder) 06/13/2013  . Erectile dysfunction 06/13/2013  . HLD (hyperlipidemia) 06/13/2013  . Hip pain 06/10/2013  . Pain in joint, shoulder region 06/10/2013  . Right leg swelling 06/03/2013  . Essential hypertension 06/03/2013  . Bradycardia by electrocardiogram 06/03/2013  . Obstructive sleep apnea 03/12/2013  .  Spinal stenosis, lumbar  region, with neurogenic claudication 12/14/2012   Janna Arch, PT, DPT   12/16/2019, 1:13 PM  [place] MAIN [facility] SERVICES [address] [place], [place], [address] Phone: [phone]   Fax:  [phone]  Name: TINSLEY LOMAS MRN: [id number] Date of Birth: 1950-05-05

## 2019-12-16 NOTE — Therapy (Signed)
[place] MAIN [facility] SERVICES [address] [place], [place], [address] Phone: [phone]   Fax:  [phone]  Occupational Therapy Treatment  Patient Details  Name: BRYCETON HANTZ MRN: [id number] Date of Birth: 1951-04-03 No data recorded  Encounter Date: 12/16/2019   OT End of Session - 12/16/19 1155    Visit Number 2    Number of Visits 24    Date for OT Re-Evaluation 03/06/20    Authorization Type reporting period starting 12/11/2019    OT Start Time 1100    OT Stop Time 1145    OT Time Calculation (min) 45 min    Activity Tolerance Patient tolerated treatment well    Behavior During Therapy [facility] for tasks assessed/performed           Past Medical History:  Diagnosis Date  . Arthritis   . Bradycardia   . Cancer [facility]) 2013   skin cancer  . Depression    ptsd  . Dysrhythmia    chronic slow heart rate  . GERD (gastroesophageal reflux disease)   . Headache(784.0)    tension headaches non recent  . History of chicken pox   . History of kidney stones    passed  . Hypertension    treated with HCTZ  . Parkinson's disease ([place])    dx'ed 15 years ago  . PTSD (post-traumatic stress disorder)   . Shortness of breath dyspnea   . Sleep apnea    doesn't use C-pap  . Varicose veins     Past Surgical History:  Procedure Laterality Date  . CHOLECYSTECTOMY N/A 10/22/2014   Procedure: LAPAROSCOPIC CHOLECYSTECTOMY WITH INTRAOPERATIVE CHOLANGIOGRAM;  Surgeon: Dia Crawford III, MD;  Location: [facility] ORS;  Service: General;  Laterality: N/A;  . cyst removed      from lip as a child  . INTRAMEDULLARY (IM) NAIL INTERTROCHANTERIC N/A 02/18/2019   Procedure: INTRAMEDULLARY (IM) NAIL INTERTROCHANTRIC, RIGHT,;  Surgeon: Thornton Park, MD;  Location: [facility] ORS;  Service: Orthopedics;  Laterality: N/A;  . LUMBAR LAMINECTOMY/DECOMPRESSION MICRODISCECTOMY Bilateral 12/14/2012   Procedure: Bilateral lumbar three-four, four-five decompressive  laminotomy/foraminotomy;  Surgeon: Charlie Pitter, MD;  Location: [place] NEURO ORS;  Service: Neurosurgery;  Laterality: Bilateral;  . PULSE GENERATOR IMPLANT Bilateral 12/13/2013   Procedure: Bilateral implantable pulse generator placement;  Surgeon: Erline Levine, MD;  Location: [place] NEURO ORS;  Service: Neurosurgery;  Laterality: Bilateral;  Bilateral implantable pulse generator placement  . skin cancer removed     from ears,   12 lft arm  rt leg 15  . SUBTHALAMIC STIMULATOR BATTERY REPLACEMENT Bilateral 07/14/2017   Procedure: BILATERAL IMPLANTED PULSE GENERATOR CHANGE FOR DEEP BRAIN STIMULATOR;  Surgeon: Erline Levine, MD;  Location: [place];  Service: Neurosurgery;  Laterality: Bilateral;  . SUBTHALAMIC STIMULATOR INSERTION Bilateral 12/06/2013   Procedure: SUBTHALAMIC STIMULATOR INSERTION;  Surgeon: Erline Levine, MD;  Location: [place] NEURO ORS;  Service: Neurosurgery;  Laterality: Bilateral;  Bilateral deep brain stimulator placement    There were no vitals filed for this visit.   Subjective Assessment - 12/16/19 1154    Subjective  Pt. reports that his ring is tight from increased swelling in his right hand.    Patient is accompanied by: Family member    Pertinent History Pt. is a 69 yo male who has a history of Parkinson's Disease. Pt. has a Deep Brain Stimulator in place. Pt. has a history of Low Back Pain with 2 back surgeries in August 2014, and February 2016. Pt. has  a history of multiple frequent falls, unsure of how many falls in the last 6 months.    Limitations Frequent falls, limited motor control, and [place].     Currently in Pain? No/denies          OT TREATMENT    Therapeutic Exercise:  Pt. performed intrinsic stretches with the left hand. Pt. performed bilateral gross gripping with the grip strengthener. Pt. worked on sustaining grip while grasping pegs and reaching at various heights. The gripper was placed at 23.4# of resistive force. Pt. was able to complete the task initially,  however dropped several pegs with the right hand near the end of the the task.  Manual Therapy:  Pt. tolerated retrograde massage to the left hand for edema control with the RUE elevated secondary to increased edema Education was provided to the pt., and his wife about retrograde massage for edema control, positioning, and AROM. Pt. tolerated soft tissue mobilizations for metacarpal spread stretches, and carpal rolls.    Pt.'s wife reports that th pt. had an increase in posturing with bilateral clenched fists over the past couple of months, as well as thumb, and second digit flexion with 3rd, 4th, and 5th digit extension. Pt.'s wife also reports episodes of "zoning out" at home. Pt.'s wife plans to notify the patients MD of these changes.Pt. presents with increased edema in the left hand today. Pt. responded well to edema control techniques, and retrograde massage. Pt. Dropped multiple pegs with the right hand when reaching up into abduction. Pt. continues to work on improving UE strength, and [facility] skills. Pt. continues to work on improving BUE strength, and [facility] skills in order to improve UE functioning needed for ADLs, and IADL tasks.                        OT Education - 12/16/19 1155    Education provided Yes    Education Details goals, POC    Person(s) Educated Patient;Spouse    Methods Explanation    Comprehension Verbalized understanding               OT Long Term Goals - 12/13/19 2046      OT LONG TERM GOAL #1   Title Pt. will improve Bilateral UE strength by 1 mm grade to assist with ADLs, and IADLs    Baseline BUE strength 4/5 overall at eval    Time 12    Period Weeks    Status New    Target Date 03/06/20      OT LONG TERM GOAL #2   Title Pt. will improve grip strength by 10# to open jars and containers.    Baseline difficulty at eval    Time 12    Period Weeks    Status New    Target Date 03/06/20      OT LONG TERM GOAL #3   Title Patient will  complete lower body dressing with modified independence.    Baseline min to mod assist at eval    Time 12    Period Weeks    Status New    Target Date 03/06/20      OT LONG TERM GOAL #4   Title Patient to demonstrate the ability to vacuum flooring at home with use of modified techniques with supervision.    Baseline difficulty at eval    Time 12    Period Weeks    Status New    Target Date 03/06/20  OT LONG TERM GOAL #5   Title Patient will demonstrate the ability to retrieve a snack and transport it to the living room with modified independence.    Baseline unable at eval    Time 12    Period Weeks    Status New    Target Date 03/06/20      OT LONG TERM GOAL #6   Title Patient will improve hand function to be able to perform cutting of food with modified independence.    Baseline wife helps to cut food when needed.    Time 8    Period Weeks    Status New    Target Date 02/05/20      OT LONG TERM GOAL #7   Title Pt. will write a paragraph efficiently with 90% legibility.    Baseline decreased legibility and poor hand endurance for writing more than 1 sentence.    Time 8    Period Weeks    Status New    Target Date 02/05/20      OT LONG TERM GOAL  #10   TITLE Pt will don and doff pants with supervision    Baseline min assist to manage pants    Time 12    Period Weeks    Status New    Target Date 03/06/20                 Plan - 12/16/19 1156    Clinical Impression Statement Pt.'s wife reports that th pt. had an increase in posturing with bilateral clenched fists over the past couple of months, as well as thumb, and second digit flexion with 3rd, 4th, and 5th digit extension. Pt.'s wife also reports episodes of "zoning out" at home. Pt.'s wife plans to notify the patients MD of these changes.Pt. presents with increased edema in the left hand today. Pt. responded well to edema control techniques, and retrograde massage. Pt. Dropped multiple pegs with the right  hand when reaching up into abduction. Pt. continues to work on improving UE strength, and [facility] skills. Pt. continues to work on improving BUE strength, and [facility] skills in order to improve UE functioning needed for ADLs, and IADL tasks.    OT Occupational Profile and History Detailed Assessment- Review of Records and additional review of physical, cognitive, psychosocial history related to current functional performance    Occupational performance deficits (Please refer to evaluation for details): ADL's;IADL's;Leisure    Body Structure / Function / Physical Skills ADL;Coordination;Endurance;GMC;UE functional use;Balance;Decreased knowledge of use of DME;IADL;Pain;Dexterity;FMC;Strength;Mobility;ROM    Cognitive Skills Attention;Problem Solve;Memory    Psychosocial Skills Coping Strategies;Environmental  Adaptations;Habits;Routines and Behaviors    Rehab Potential Good    Clinical Decision Making Limited treatment options, no task modification necessary    Comorbidities Affecting Occupational Performance: Presence of comorbidities impacting occupational performance    Comorbidities impacting occupational performance description: risk for falls, slow to respond and process information at times, safety    Modification or Assistance to Complete Evaluation  No modification of tasks or assist necessary to complete eval    OT Frequency 2x / week    OT Duration 12 weeks    OT Treatment/Interventions Self-care/ADL training;Therapeutic exercise;DME and/or AE instruction;Energy conservation;Neuromuscular education;Patient/family education;Therapeutic activities;Moist Heat;Functional Mobility Training;Cognitive remediation/compensation    Consulted and Agree with Plan of Care Patient           Patient will benefit from skilled therapeutic intervention in order to improve the following deficits and impairments:   Body Structure /  Function / Physical Skills: ADL, Coordination, Endurance, GMC, UE functional use,  Balance, Decreased knowledge of use of DME, IADL, Pain, Dexterity, FMC, Strength, Mobility, ROM Cognitive Skills: Attention, Problem Solve, Memory Psychosocial Skills: Coping Strategies, Environmental  Adaptations, Habits, Routines and Behaviors   Visit Diagnosis: Muscle weakness (generalized)  Other lack of coordination    Problem List Patient Active Problem List   Diagnosis Date Noted  . E. coli bacteremia 02/23/2019  . Dysphagia 02/23/2019  . Aspiration pneumonia ([place]) 02/21/2019  . Closed right hip fracture ([place]) 02/16/2019  . Rib pain on right side 09/25/2018  . Skin sore 12/01/2017  . Hematoma 10/18/2017  . Fall at home 10/18/2017  . 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 Radicular pain in right arm 10/09/2016  . GERD (gastroesophageal reflux disease) 07/31/2016  . Trochanteric bursitis 03/03/2016  . Left hand pain 10/30/2015  . Fracture, finger, multiple sites 10/30/2015  . Colon cancer screening 07/23/2015  . Healthcare maintenance 07/23/2015  . Gout 02/18/2015  . Depression 01/06/2015  . Irritation of eyelid 08/20/2014  . Dupuytren's contracture 08/20/2014  . Cough 07/21/2014  . Lumbar stenosis with neurogenic claudication 06/13/2014  . Preop cardiovascular exam 05/28/2014  . S/P deep brain stimulator placement 05/08/2014  . Joint pain 01/22/2014  . Medicare annual wellness visit, initial 01/19/2014  . Advance care planning 01/19/2014  . Parkinson's disease ([place]) 12/13/2013  . PTSD (post-traumatic stress disorder) 06/13/2013  . Erectile dysfunction 06/13/2013  . HLD (hyperlipidemia) 06/13/2013  . Hip pain 06/10/2013  . Pain in joint, shoulder region 06/10/2013  . Right leg swelling 06/03/2013  . Essential hypertension 06/03/2013  . Bradycardia by electrocardiogram 06/03/2013  . Obstructive sleep apnea 03/12/2013  . Spinal stenosis, lumbar region, with neurogenic claudication 12/14/2012    Harrel Carina, MS, OTR/L 12/16/2019, 1:32 PM  [place] MAIN [facility] SERVICES [address] [place], [place], [address] Phone: [phone]   Fax:  [phone]  Name: CONSTANTINO STARACE MRN: [id number] Date of Birth: [birth year]

## 2019-12-18 ENCOUNTER — Ambulatory Visit: Payer: Medicare PPO

## 2019-12-18 ENCOUNTER — Ambulatory Visit: Payer: Medicare PPO | Admitting: Occupational Therapy

## 2019-12-19 DIAGNOSIS — M5416 Radiculopathy, lumbar region: Secondary | ICD-10-CM | POA: Diagnosis not present

## 2019-12-19 DIAGNOSIS — M47896 Other spondylosis, lumbar region: Secondary | ICD-10-CM | POA: Diagnosis not present

## 2019-12-19 DIAGNOSIS — G894 Chronic pain syndrome: Secondary | ICD-10-CM | POA: Diagnosis not present

## 2019-12-19 DIAGNOSIS — Z79899 Other long term (current) drug therapy: Secondary | ICD-10-CM | POA: Diagnosis not present

## 2019-12-20 ENCOUNTER — Ambulatory Visit (INDEPENDENT_AMBULATORY_CARE_PROVIDER_SITE_OTHER): Payer: Medicare PPO | Admitting: Psychology

## 2019-12-20 DIAGNOSIS — G20 Parkinson's disease: Secondary | ICD-10-CM | POA: Diagnosis not present

## 2019-12-20 DIAGNOSIS — F331 Major depressive disorder, recurrent, moderate: Secondary | ICD-10-CM | POA: Diagnosis not present

## 2019-12-20 DIAGNOSIS — F4323 Adjustment disorder with mixed anxiety and depressed mood: Secondary | ICD-10-CM | POA: Diagnosis not present

## 2019-12-20 DIAGNOSIS — R4689 Other symptoms and signs involving appearance and behavior: Secondary | ICD-10-CM | POA: Diagnosis not present

## 2019-12-25 ENCOUNTER — Ambulatory Visit: Payer: Medicare PPO | Attending: Neurology

## 2019-12-25 ENCOUNTER — Other Ambulatory Visit: Payer: Self-pay

## 2019-12-25 ENCOUNTER — Encounter: Payer: Self-pay | Admitting: Occupational Therapy

## 2019-12-25 ENCOUNTER — Ambulatory Visit: Payer: Medicare PPO | Admitting: Occupational Therapy

## 2019-12-25 DIAGNOSIS — M6281 Muscle weakness (generalized): Secondary | ICD-10-CM | POA: Insufficient documentation

## 2019-12-25 DIAGNOSIS — R278 Other lack of coordination: Secondary | ICD-10-CM | POA: Diagnosis not present

## 2019-12-25 DIAGNOSIS — G20 Parkinson's disease: Secondary | ICD-10-CM | POA: Diagnosis not present

## 2019-12-25 DIAGNOSIS — R2681 Unsteadiness on feet: Secondary | ICD-10-CM | POA: Diagnosis not present

## 2019-12-25 DIAGNOSIS — M25551 Pain in right hip: Secondary | ICD-10-CM

## 2019-12-25 NOTE — Therapy (Addendum)
[place] MAIN [facility] SERVICES [address] [place], [place], [address] Phone: [phone]   Fax:  [phone]  Occupational Therapy Treatment  Patient Details  Name: [name] MRN: [id number] Date of Birth: Dec 05, 1950 No data recorded  Encounter Date: 12/25/2019   OT End of Session - 12/25/19 1155    Visit Number 3    Number of Visits 24    Date for OT Re-Evaluation 03/06/20    Authorization Type reporting period starting 12/11/2019    OT Start Time 1105    OT Stop Time 1145    OT Time Calculation (min) 40 min    Activity Tolerance Patient tolerated treatment well    Behavior During Therapy [facility] for tasks assessed/performed           Past Medical History:  Diagnosis Date  . Arthritis   . Bradycardia   . Cancer [facility]) 2013   skin cancer  . Depression    ptsd  . Dysrhythmia    chronic slow heart rate  . GERD (gastroesophageal reflux disease)   . Headache(784.0)    tension headaches non recent  . History of chicken pox   . History of kidney stones    passed  . Hypertension    treated with HCTZ  . Parkinson's disease ([place])    dx'ed 15 years ago  . PTSD (post-traumatic stress disorder)   . Shortness of breath dyspnea   . Sleep apnea    doesn't use C-pap  . Varicose veins     Past Surgical History:  Procedure Laterality Date  . CHOLECYSTECTOMY N/A 10/22/2014   Procedure: LAPAROSCOPIC CHOLECYSTECTOMY WITH INTRAOPERATIVE CHOLANGIOGRAM;  Surgeon: Dia Crawford III, MD;  Location: [facility] ORS;  Service: General;  Laterality: N/A;  . cyst removed      from lip as a child  . INTRAMEDULLARY (IM) NAIL INTERTROCHANTERIC N/A 02/18/2019   Procedure: INTRAMEDULLARY (IM) NAIL INTERTROCHANTRIC, RIGHT,;  Surgeon: Thornton Park, MD;  Location: [facility] ORS;  Service: Orthopedics;  Laterality: N/A;  . LUMBAR LAMINECTOMY/DECOMPRESSION MICRODISCECTOMY Bilateral 12/14/2012   Procedure: Bilateral lumbar three-four, four-five decompressive  laminotomy/foraminotomy;  Surgeon: Charlie Pitter, MD;  Location: [place] NEURO ORS;  Service: Neurosurgery;  Laterality: Bilateral;  . PULSE GENERATOR IMPLANT Bilateral 12/13/2013   Procedure: Bilateral implantable pulse generator placement;  Surgeon: Erline Levine, MD;  Location: [place] NEURO ORS;  Service: Neurosurgery;  Laterality: Bilateral;  Bilateral implantable pulse generator placement  . skin cancer removed     from ears,   12 lft arm  rt leg 15  . SUBTHALAMIC STIMULATOR BATTERY REPLACEMENT Bilateral 07/14/2017   Procedure: BILATERAL IMPLANTED PULSE GENERATOR CHANGE FOR DEEP BRAIN STIMULATOR;  Surgeon: Erline Levine, MD;  Location: [place];  Service: Neurosurgery;  Laterality: Bilateral;  . SUBTHALAMIC STIMULATOR INSERTION Bilateral 12/06/2013   Procedure: SUBTHALAMIC STIMULATOR INSERTION;  Surgeon: Erline Levine, MD;  Location: [place] NEURO ORS;  Service: Neurosurgery;  Laterality: Bilateral;  Bilateral deep brain stimulator placement    There were no vitals filed for this visit.   Subjective Assessment - 12/25/19 1154    Subjective  Pt. reports have a good weekend.    Patient is accompanied by: Family member    Pertinent History Pt. is a 69 yo male who has a history of Parkinson's Disease. Pt. has a Deep Brain Stimulator in place. Pt. has a history of Low Back Pain with 2 back surgeries in August 2014, and February 2016. Pt. has a history of multiple frequent falls, unsure of  how many falls in the last 6 months.    Currently in Pain? No/denies          OT TREATMENT    Therapeutic Exercise:  3# dumbbell ex. for elbow flexion and extension, forearm supination/pronation, wrist flexion/extension, and radial deviation. Pt. requires rest breaks and verbal cues, tactile cues, and cues for visual demonstration for proper technique. Pt. performed intrinsic stretches with the left hand. Pt. performed bilateral gross gripping with the grip strengthener. Pt. worked on sustaining grip while grasping pegs and  reaching at various heights. The gripper was placed at 23.4# of resistive force. One peg was dropped with the right hand near the end of the the task Pt. worked on gross gripping using the [employer] grip strengthener: R: 54#, 50.2#, 49.6#, 48#, L: 54.2#, 49.6#, 49.4#, 49#.  Pt. went to his neurologist, and Is now planning to have an EEG, secondary to consistent, and increasing episodes of "zoning out". Pt.'s left hand edema has improved. Pt. dropped only one peg with the right hand when reaching up into abduction to place the pegs into a container. Pt. continues to work on improving UE strength, and [facility] skills. Pt. continues to work on improving BUE strength, and [facility] skills in order to improve UE functioning needed for ADLs, IADL tasks, and typing.                        OT Education - 12/25/19 1155    Education provided Yes    Person(s) Educated Patient;Spouse    Methods Explanation    Comprehension Verbalized understanding               OT Long Term Goals - 12/13/19 2046      OT LONG TERM GOAL #1   Title Pt. will improve Bilateral UE strength by 1 mm grade to assist with ADLs, and IADLs    Baseline BUE strength 4/5 overall at eval    Time 12    Period Weeks    Status New    Target Date 03/06/20      OT LONG TERM GOAL #2   Title Pt. will improve grip strength by 10# to open jars and containers.    Baseline difficulty at eval    Time 12    Period Weeks    Status New    Target Date 03/06/20      OT LONG TERM GOAL #3   Title Patient will complete lower body dressing with modified independence.    Baseline min to mod assist at eval    Time 12    Period Weeks    Status New    Target Date 03/06/20      OT LONG TERM GOAL #4   Title Patient to demonstrate the ability to vacuum flooring at home with use of modified techniques with supervision.    Baseline difficulty at eval    Time 12    Period Weeks    Status New    Target Date 03/06/20      OT LONG  TERM GOAL #5   Title Patient will demonstrate the ability to retrieve a snack and transport it to the living room with modified independence.    Baseline unable at eval    Time 12    Period Weeks    Status New    Target Date 03/06/20      OT LONG TERM GOAL #6   Title Patient will improve hand function  to be able to perform cutting of food with modified independence.    Baseline wife helps to cut food when needed.    Time 8    Period Weeks    Status New    Target Date 02/05/20      OT LONG TERM GOAL #7   Title Pt. will write a paragraph efficiently with 90% legibility.    Baseline decreased legibility and poor hand endurance for writing more than 1 sentence.    Time 8    Period Weeks    Status New    Target Date 02/05/20      OT LONG TERM GOAL  #10   TITLE Pt will don and doff pants with supervision    Baseline min assist to manage pants    Time 12    Period Weeks    Status New    Target Date 03/06/20                 Plan - 12/25/19 1156    Clinical Impression Statement Pt. went to his neurologist, and Is now planning to have an EEG, secondary to consistent, and increasing episodes of "zoning out". Pt.'s left hand edema has improved. Pt. dropped only one peg with the right hand when reaching up into abduction to place the pegs into a container. Pt. continues to work on improving UE strength, and [facility] skills. Pt. continues to work on improving BUE strength, and [facility] skills in order to improve UE functioning needed for ADLs, IADL tasks, and typing.    OT Occupational Profile and History Detailed Assessment- Review of Records and additional review of physical, cognitive, psychosocial history related to current functional performance    Occupational performance deficits (Please refer to evaluation for details): ADL's;IADL's;Leisure    Body Structure / Function / Physical Skills ADL;Coordination;Endurance;GMC;UE functional use;Balance;Decreased knowledge of use of  DME;IADL;Pain;Dexterity;FMC;Strength;Mobility;ROM    Cognitive Skills Attention;Problem Solve;Memory    Psychosocial Skills Coping Strategies;Environmental  Adaptations;Habits;Routines and Behaviors    Rehab Potential Good    Clinical Decision Making Limited treatment options, no task modification necessary    Comorbidities Affecting Occupational Performance: Presence of comorbidities impacting occupational performance    Comorbidities impacting occupational performance description: risk for falls, slow to respond and process information at times, safety    Modification or Assistance to Complete Evaluation  No modification of tasks or assist necessary to complete eval    OT Frequency 2x / week    OT Duration 12 weeks    OT Treatment/Interventions Self-care/ADL training;Therapeutic exercise;DME and/or AE instruction;Energy conservation;Neuromuscular education;Patient/family education;Therapeutic activities;Moist Heat;Functional Mobility Training;Cognitive remediation/compensation    Consulted and Agree with Plan of Care Patient           Patient will benefit from skilled therapeutic intervention in order to improve the following deficits and impairments:   Body Structure / Function / Physical Skills: ADL, Coordination, Endurance, GMC, UE functional use, Balance, Decreased knowledge of use of DME, IADL, Pain, Dexterity, FMC, Strength, Mobility, ROM Cognitive Skills: Attention, Problem Solve, Memory Psychosocial Skills: Coping Strategies, Environmental  Adaptations, Habits, Routines and Behaviors   Visit Diagnosis: Muscle weakness (generalized)  Other lack of coordination    Problem List Patient Active Problem List   Diagnosis Date Noted  . E. coli bacteremia 02/23/2019  . Dysphagia 02/23/2019  . Aspiration pneumonia ([place]) 02/21/2019  . Closed right hip fracture ([place]) 02/16/2019  . Rib pain on right side 09/25/2018  . Skin sore 12/01/2017  . Hematoma 10/18/2017  . Fall  at home  10/18/2017  . 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 Radicular pain in right arm 10/09/2016  . GERD (gastroesophageal reflux disease) 07/31/2016  . Trochanteric bursitis 03/03/2016  . Left hand pain 10/30/2015  . Fracture, finger, multiple sites 10/30/2015  . Colon cancer screening 07/23/2015  . Healthcare maintenance 07/23/2015  . Gout 02/18/2015  . Depression 01/06/2015  . Irritation of eyelid 08/20/2014  . Dupuytren's contracture 08/20/2014  . Cough 07/21/2014  . Lumbar stenosis with neurogenic claudication 06/13/2014  . Preop cardiovascular exam 05/28/2014  . S/P deep brain stimulator placement 05/08/2014  . Joint pain 01/22/2014  . Medicare annual wellness visit, initial 01/19/2014  . Advance care planning 01/19/2014  . Parkinson's disease ([place]) 12/13/2013  . PTSD (post-traumatic stress disorder) 06/13/2013  . Erectile dysfunction 06/13/2013  . HLD (hyperlipidemia) 06/13/2013  . Hip pain 06/10/2013  . Pain in joint, shoulder region 06/10/2013  . Right leg swelling 06/03/2013  . Essential hypertension 06/03/2013  . Bradycardia by electrocardiogram 06/03/2013  . Obstructive sleep apnea 03/12/2013  . Spinal stenosis, lumbar region, with neurogenic claudication 12/14/2012    Harrel Carina, MS, OTR/L 12/25/2019, 11:59 AM  [place] MAIN [facility] SERVICES [place], [place], [address] Phone: [phone]   Fax:  [phone]  Name: KIARA KEEP MRN: [id number] Date of Birth: Jul 30, 1950

## 2019-12-25 NOTE — Therapy (Signed)
[place] MAIN [facility] SERVICES [address] [place], [place], [address] Phone: [phone]   Fax:  [phone]  Physical Therapy Treatment  Patient Details  Name: [name] MRN: [id number] Date of Birth: 05-25-1950 Referring Provider (PT): Sharolyn Douglas MD    Encounter Date: 12/25/2019   PT End of Session - 12/25/19 1151    Visit Number 27    Number of Visits 40    Date for PT Re-Evaluation 02/03/20    Authorization Type 7/10 PN 8/9    Authorization Time Period FOTO- PT    PT Start Time 1016    PT Stop Time 1059    PT Time Calculation (min) 43 min    Equipment Utilized During Treatment Gait belt    Activity Tolerance Patient tolerated treatment well;No increased pain    Behavior During Therapy WFL for tasks assessed/performed           Past Medical History:  Diagnosis Date  . Arthritis   . Bradycardia   . Cancer [facility]) 2013   skin cancer  . Depression    ptsd  . Dysrhythmia    chronic slow heart rate  . GERD (gastroesophageal reflux disease)   . Headache(784.0)    tension headaches non recent  . History of chicken pox   . History of kidney stones    passed  . Hypertension    treated with HCTZ  . Parkinson's disease ([place] )    dx'ed 15 years ago  . PTSD (post-traumatic stress disorder)   . Shortness of breath dyspnea   . Sleep apnea    doesn't use C-pap  . Varicose veins     Past Surgical History:  Procedure Laterality Date  . CHOLECYSTECTOMY N/A 10/22/2014   Procedure: LAPAROSCOPIC CHOLECYSTECTOMY WITH INTRAOPERATIVE CHOLANGIOGRAM;  Surgeon: Dia Crawford III, MD;  Location: [facility] ORS;  Service: General;  Laterality: N/A;  . cyst removed      from lip as a child  . INTRAMEDULLARY (IM) NAIL INTERTROCHANTERIC N/A 02/18/2019   Procedure: INTRAMEDULLARY (IM) NAIL INTERTROCHANTRIC, RIGHT,;  Surgeon: Thornton Park, MD;  Location: [facility] ORS;  Service: Orthopedics;  Laterality: N/A;  . LUMBAR LAMINECTOMY/DECOMPRESSION  MICRODISCECTOMY Bilateral 12/14/2012   Procedure: Bilateral lumbar three-four, four-five decompressive laminotomy/foraminotomy;  Surgeon: Charlie Pitter, MD;  Location: [place] NEURO ORS;  Service: Neurosurgery;  Laterality: Bilateral;  . PULSE GENERATOR IMPLANT Bilateral 12/13/2013   Procedure: Bilateral implantable pulse generator placement;  Surgeon: Erline Levine, MD;  Location: [place] NEURO ORS;  Service: Neurosurgery;  Laterality: Bilateral;  Bilateral implantable pulse generator placement  . skin cancer removed     from ears,   12 lft arm  rt leg 15  . SUBTHALAMIC STIMULATOR BATTERY REPLACEMENT Bilateral 07/14/2017   Procedure: BILATERAL IMPLANTED PULSE GENERATOR CHANGE FOR DEEP BRAIN STIMULATOR;  Surgeon: Erline Levine, MD;  Location: [place];  Service: Neurosurgery;  Laterality: Bilateral;  . SUBTHALAMIC STIMULATOR INSERTION Bilateral 12/06/2013   Procedure: SUBTHALAMIC STIMULATOR INSERTION;  Surgeon: Erline Levine, MD;  Location: [place] NEURO ORS;  Service: Neurosurgery;  Laterality: Bilateral;  Bilateral deep brain stimulator placement    There were no vitals filed for this visit.   Subjective Assessment - 12/25/19 1023    Subjective Patient denies any falls since last session. New caregiver is present for session. Pt notes increased stiffnes in R hip.    Patient is accompained by: Family member    Pertinent History [name] is a 69 y.o. male with a hx of HTN,  bradycardia, Parkinson's s/p deep brain stimulator 11/2013 , HLD, depression, possible seizure disorder, arthritis, depression/PTSD, dysrhythmia, GERD, sleep apnea. Patient was seen by this therapist in years past. Patient had a fall around Halloween of 2020 and admitted for ORIF on 02/18/19, was discharged to SNF and had [place] around Christmas. Had home health therapy for a month and a half. After that tried to go to Outpatient Therapy for 3 sessions at [facility] and was not pleased due to how busy and crowded the location was. Fell last week in  bathroom, didn't take walker in, falls about 2x month. Is very weak in RLE. Patient has a new lift chair and U drive walking. Has a caregiver 4 days a week. Has additional order for radiculopathy, lumbar region. PMH of lumbar laminectomy/decompression microdiscectomy (L3-4, 4-5)  In 2014.  Lumbar 4-5 cage +rod (2016).    Limitations Walking;Standing;Sitting;House hold activities;Lifting    How long can you sit comfortably? not limited with a backrest, without a back rest 3 minutes    How long can you stand comfortably? 3-5 minutes with holding on.    Patient Stated Goals pain reduction of R hip. strength of legs, dance with wife. push lawnmower.    Currently in Pain? Yes    Pain Score 2     Pain Location Back    Pain Orientation Lower    Pain Descriptors / Indicators Aching    Pain Type Chronic pain    Pain Onset More than a month ago    Pain Frequency Intermittent            Supine: Hamstring stretch 2 x 30 seconds each leg. PT educates caregiver on proper form and execution of task. Second set with overpressure of dorsiflexion for increased lengthening   R hip inferior distraction with belt SAD (short arc distraction) for pain management. 30 second hold 3x.   R hip inferior distraction with belt in figure four position (SAD) for pain reduction 30 seconds x 2 trials   Hooklying TA bracing with green physioball to promote core stabilization. PT cues for breathing and correct form. 10x with 3 second holds. Progressed to alternating arm raises while performing TA bracing 10x each UE for second set.    Scapular protraction and retraction with green physioball in supine to strengthen scapular musculature 10x.   SLR with 2.5# AW for LE strengthening. Pt requires VCs to maintain knee extension during task. Performed 10x each leg. Cues for maintaining R hip neutral alignment for reduction of adduction   Heel slides with 2.5# AW for LE strengthening. VCs to maintain abduction on R for  increased safety during ambulation. Performed 10x each leg. Cues for maintaining R hip neutral alignment for reduction of adduction    Seated:  Marches with 2.5# AW. Hands behind back for upright posture. 10x each leg.  LAQ with 2.5# AW. 10x each leg. Upright posture for core activation   STM lumbar paraspinals bilaterally with pt seated d/t shoulder pain in prone. Implementation of effleurage and ptrissage for muscle tension reduction and pain reduction.   Sit to stand with rollator in front for support. LOB x 1 due to lack of forward lean into stance. CGA for safety. Cues for upright posture, glute activation, and nose over toes.   Standing weight shift onto LLE for neutral weight acceptance with cues to bring hips to PT x 1 for 20 seconds  Ambulate with RW with CGA, cues for widening BOS for safety and decreased instability x  30 ft into OT gym.     Pt educated throughout session about proper posture and technique with exercises. Improved exercise technique, movement at target joints, use of target muscles after min to mod verbal, visual, tactile cues                       PT Education - 12/25/19 1024    Education provided Yes    Education Details Economist, caregiver education,    Person(s) Educated Patient;Spouse;Caregiver(s)    Methods Explanation;Demonstration;Tactile cues;Verbal cues    Comprehension Verbalized understanding;Returned demonstration;Verbal cues required;Tactile cues required            PT Short Term Goals - 12/09/19 1247      PT SHORT TERM GOAL #1   Title Pt will perform HEP with family's/aide's supervision, for improved balance, transfers, and gait.    Baseline 5/5: HEP given 6/28: HEP compliant 8/23: aide helps with HEP    Time 4    Period Weeks    Status Achieved    Target Date 09/19/19             PT Long Term Goals - 12/09/19 1248      PT LONG TERM GOAL #1   Title Patient (> 25 years old) will complete five times sit  to stand test in < 15 seconds without UE support without LOB indicating an increased LE strength and improved balance.    Baseline 5/5: 19 seconds one posterior LOB 6/28: 12.32 no UE support    Time 8    Period Weeks    Status Achieved      PT LONG TERM GOAL #2   Title Patient will increase 10 meter walk test to >1.98ms as to improve gait speed for better community ambulation and to reduce fall risk.    Baseline 4/4: 0.71 m/s with U step walker 6/28: 0.917 m/s with U step rollator 8/23: >1.0 m/s with rollator    Time 8    Period Weeks    Status Achieved      PT LONG TERM GOAL #3   Title Patient will increase Berg Balance score by > 6 points (32/56) to demonstrate decreased fall risk during functional activities.    Baseline 5/5: 26/56 6/28: 36/56    Time 8    Period Weeks    Status Achieved      PT LONG TERM GOAL #4   Title Patient will increase FOTO score to equal to or greater than 31/100  to demonstrate statistically significant improvement in mobility and quality of life.    Baseline 5/5: 12/100 6/28: 38% 8/23: 11.7    Time 8    Period Weeks    Status On-going    Target Date 02/03/20      PT LONG TERM GOAL #5   Title Patient will increase six minute walk test distance to >1000 for progression to community ambulator and improve gait ability    Baseline 6/28: 750 ft with U step rollator 8/23: 775 w rolling walker, increased crouch pattern of ambulation    Time 8    Period Weeks    Status Partially Met    Target Date 02/03/20      PT LONG TERM GOAL #6   Title Patient will increase Berg Balance score by > 6 points (42/56) to demonstrate decreased fall risk during functional activities.    Baseline 6/28: 36/56 8/23: 42/56    Time 8    Period Weeks  Status Partially Met    Target Date 02/03/20      PT LONG TERM GOAL #7   Title Patient will report a worst pain of 3/10 on VAS in low back to improve tolerance with ADLs and reduced symptoms with activities.    Baseline 8/9:  9/10 8/23: 7/10 pain    Time 8    Period Weeks    Status Partially Met    Target Date 02/03/20      PT LONG TERM GOAL #8   Title Patient will reduce modified Oswestry score to <20 as to demonstrate minimal disability with ADLs including improved sleeping tolerance, walking/sitting tolerance etc for better mobility with ADLs.    Baseline 8/9: 64% 8/23: 66%    Time 8    Period Weeks    Status On-going    Target Date 02/03/20                    Plan - 12/25/19 1204    Clinical Impression Statement Patient continues to progress with core stabilization and LE strengthening. Patient requires cueing for proper form throughout functional tasks, such as forward lean during sit to stand. Standing exercise limited by RLE pain. Patient demonstrates "blanking" x 1 during session, requiring PT to re-orient to task. Patient would benefit from skilled physical therapy to increase strength, stability, balance, and decrease pain to improve quality of life.    Personal Factors and Comorbidities Age;Comorbidity 3+;Past/Current Experience;Time since onset of injury/illness/exacerbation;Transportation    Comorbidities athritis, bradycardia, cancer, depression, dysrhythmia, GERD, HTN, Parkinson's, PTSD, SOB, sleep apnea    Examination-Activity Limitations Bathing;Bed Mobility;Caring for Others;Bend;Dressing;Lift;Locomotion Level;Reach Overhead;Sit;Squat;Stairs;Stand;Toileting;Transfers    Examination-Participation Restrictions Church;Cleaning;Community Activity;Interpersonal Relationship;Shop;Volunteer;Yard Work;Other    Stability/Clinical Decision Making Stable/Uncomplicated    Rehab Potential Fair    PT Frequency 2x / week    PT Duration 8 weeks    PT Treatment/Interventions ADLs/Self Care Home Management;Aquatic Therapy;Cryotherapy;Electrical Stimulation;Iontophoresis 36m/ml Dexamethasone;Moist Heat;Ultrasound;Contrast Bath;DME Instruction;Gait training;Stair training;Functional mobility  training;Therapeutic activities;Therapeutic exercise;Balance training;Neuromuscular re-education;Patient/family education;Manual techniques;Wheelchair mobility training;Energy conservation;Passive range of motion;Traction;Orthotic Fit/Training;Dry needling;Vestibular;Visual/perceptual remediation/compensation;Taping;Canalith Repostioning    PT Next Visit Plan core and pain relief    PT [place] Agree with Plan of Care Patient;Family member/caregiver    Family Member Consulted wife           Patient will benefit from skilled therapeutic intervention in order to improve the following deficits and impairments:  Abnormal gait, Decreased balance, Decreased coordination, Decreased mobility, Impaired tone, Postural dysfunction, Decreased strength, Decreased safety awareness, Improper body mechanics, Impaired flexibility, Decreased activity tolerance, Decreased endurance, Decreased knowledge of precautions, Difficulty walking, Pain, Cardiopulmonary status limiting activity, Decreased range of motion, Impaired perceived functional ability  Visit Diagnosis: Muscle weakness (generalized)  Other lack of coordination  Parkinson's disease (HCC)  Unsteadiness on feet  Pain in right hip     Problem List Patient Active Problem List   Diagnosis Date Noted  . E. coli bacteremia 02/23/2019  . Dysphagia 02/23/2019  . Aspiration pneumonia ([place] 02/21/2019  . Closed right hip fracture ([place] 02/16/2019  . Rib pain on right side 09/25/2018  . Skin sore 12/01/2017  . Hematoma 10/18/2017  . Fall at home 10/18/2017  . 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 Radicular pain in right arm 10/09/2016  . GERD (gastroesophageal reflux disease) 07/31/2016  . Trochanteric bursitis 03/03/2016  . Left hand pain 10/30/2015  . Fracture, finger, multiple sites 10/30/2015  . Colon cancer screening 07/23/2015  .  Healthcare maintenance 07/23/2015  . Gout 02/18/2015  . Depression  01/06/2015  . Irritation of eyelid 08/20/2014  . Dupuytren's contracture 08/20/2014  . Cough 07/21/2014  . Lumbar stenosis with neurogenic claudication 06/13/2014  . Preop cardiovascular exam 05/28/2014  . S/P deep brain stimulator placement 05/08/2014  . Joint pain 01/22/2014  . Medicare annual wellness visit, initial 01/19/2014  . Advance care planning 01/19/2014  . Parkinson's disease ([place]) 12/13/2013  . PTSD (post-traumatic stress disorder) 06/13/2013  . Erectile dysfunction 06/13/2013  . HLD (hyperlipidemia) 06/13/2013  . Hip pain 06/10/2013  . Pain in joint, shoulder region 06/10/2013  . Right leg swelling 06/03/2013  . Essential hypertension 06/03/2013  . Bradycardia by electrocardiogram 06/03/2013  . Obstructive sleep apnea 03/12/2013  . Spinal stenosis, lumbar region, with neurogenic claudication 12/14/2012   Janna Arch, PT, DPT   12/25/2019, 12:06 PM  [place] MAIN [facility] SERVICES [address] [place], [place], [address] Phone: [phone]   Fax:  [phone]  Name: BRODY BONNEAU MRN: [id number] Date of Birth: 1950/08/07

## 2019-12-28 NOTE — Progress Notes (Signed)
Cardiology Office Note  Date:  12/30/2019   ID:  [name], DOB 1950/07/21, MRN [id number]  PCP:  Tonia Ghent, MD   Chief Complaint  Patient presents with  . OTher    3 month follow up. Meds reviewed verbally with patietn.     HPI:  [name] is a pleasant 69 -year-old male year-old gentleman with  Parkinson's for more than 10 years,  Hypertension, chronic bradycardia  placement of deep brain stimulator August 2015 for tremor followup at the [facility] hyperlipidemia, depression, possible seizure disorder Long-standing history of heart rate 40-50 bpm and in general has been asymptomatic who presents for routine follow-up of his blood pressure and bradycardia, hypertension.   Presents today with his wife Periods of "checking out" Several times in a day "blank stare" Has been seen by neurology, plan for EEG  Previous cardiac studies reviewed with him in detail  Ziopatch monitor showed episodes of supraventricular tachycardia superimposed on a baseline of bradycardia. Seen by EP 11/2019 Plan to repeat Zio in 6 months  Followed by orthopedics Has pain stimulator, chronic back pain Scheduled for ablation in his back  Losartan dose has been decreased, still on 25 mg daily  Walks with walker, no recent falls Also has a scooter Chronic fatigue, no chest pain or shortness of breath  Lab work reviewed Total cho 140, LDL 76  EKG personally reviewed by myself on todays visit Shows sinus bradycardia rate 44 bpm nonspecific T wave abnormality, artifact secondary to brain stimulator  Other past medical history reviewed CT scan of the head 05/29/2013 showing mild chronic inflammatory disease of the sinuses. This was done for right sided headache. Work done 05/29/2013 includes basic    PMH:   has a past medical history of Arthritis, Bradycardia, Cancer ([place]) (2013), Depression, Dysrhythmia, GERD (gastroesophageal reflux disease), Headache(784.0), History of chicken  pox, History of kidney stones, Hypertension, Parkinson's disease ([place]), PTSD (post-traumatic stress disorder), Shortness of breath dyspnea, Sleep apnea, and Varicose veins.  PSH:    Past Surgical History:  Procedure Laterality Date  . CHOLECYSTECTOMY N/A 10/22/2014   Procedure: LAPAROSCOPIC CHOLECYSTECTOMY WITH INTRAOPERATIVE CHOLANGIOGRAM;  Surgeon: Dia Crawford III, MD;  Location: [facility] ORS;  Service: General;  Laterality: N/A;  . cyst removed      from lip as a child  . INTRAMEDULLARY (IM) NAIL INTERTROCHANTERIC N/A 02/18/2019   Procedure: INTRAMEDULLARY (IM) NAIL INTERTROCHANTRIC, RIGHT,;  Surgeon: Thornton Park, MD;  Location: [facility] ORS;  Service: Orthopedics;  Laterality: N/A;  . LUMBAR LAMINECTOMY/DECOMPRESSION MICRODISCECTOMY Bilateral 12/14/2012   Procedure: Bilateral lumbar three-four, four-five decompressive laminotomy/foraminotomy;  Surgeon: Charlie Pitter, MD;  Location: [place] NEURO ORS;  Service: Neurosurgery;  Laterality: Bilateral;  . PULSE GENERATOR IMPLANT Bilateral 12/13/2013   Procedure: Bilateral implantable pulse generator placement;  Surgeon: Erline Levine, MD;  Location: [place] NEURO ORS;  Service: Neurosurgery;  Laterality: Bilateral;  Bilateral implantable pulse generator placement  . skin cancer removed     from ears,   12 lft arm  rt leg 15  . SUBTHALAMIC STIMULATOR BATTERY REPLACEMENT Bilateral 07/14/2017   Procedure: BILATERAL IMPLANTED PULSE GENERATOR CHANGE FOR DEEP BRAIN STIMULATOR;  Surgeon: Erline Levine, MD;  Location: [place];  Service: Neurosurgery;  Laterality: Bilateral;  . SUBTHALAMIC STIMULATOR INSERTION Bilateral 12/06/2013   Procedure: SUBTHALAMIC STIMULATOR INSERTION;  Surgeon: Erline Levine, MD;  Location: [place] NEURO ORS;  Service: Neurosurgery;  Laterality: Bilateral;  Bilateral deep brain stimulator placement    Current Outpatient Medications  Medication Sig Dispense Refill  .  aspirin EC 81 MG tablet Take 1 tablet (81 mg total) by mouth daily. Swallow whole. 90 tablet  3  . carbidopa-levodopa (SINEMET IR) 25-100 MG tablet 2 tablets in AM, 1.5 tablets in afternoon, and 1.5 tablets in the evening    . cholecalciferol (VITAMIN D) 1000 UNITS tablet Take 1,000 Units by mouth daily.    Marland Kitchen escitalopram (LEXAPRO) 10 MG tablet TAKE 1 TABLET DAILY 90 tablet 3  . hydrALAZINE (APRESOLINE) 25 MG tablet Take 1 tablet (25 mg total) by mouth as needed (For elevated blood pressure. Take if you have 2 consecutive readings that are one hour apart with systolic BP >379). 30 tablet 0  . Melatonin 10 MG TABS Take 10 mg by mouth at bedtime.    . memantine (NAMENDA) 10 MG tablet TAKE 1 TABLET TWICE A DAY 180 tablet 3  . omeprazole (PRILOSEC) 20 MG capsule TAKE 1 CAPSULE DAILY 90 capsule 3  . rivastigmine (EXELON) 9.5 mg/24hr 9.5 mg daily.    . traMADol-acetaminophen (ULTRACET) 37.5-325 MG tablet Take 1 tablet by mouth in the morning and at bedtime.     No current facility-administered medications for this visit.     Allergies:   Clonazepam, Morphine, Morphine and related, and Prednisone   Social History:  The patient  reports that he has never smoked. He has quit using smokeless tobacco. He reports current alcohol use. He reports that he does not use drugs.   Family History:   family history includes Lung cancer in his father.    Review of Systems: Review of Systems  HENT: Negative.   Respiratory: Negative.   Cardiovascular: Negative.   Gastrointestinal: Negative.   Musculoskeletal:       Unsteady gait  Neurological: Positive for weakness.  Psychiatric/Behavioral: Negative.   All other systems reviewed and are negative.   PHYSICAL EXAM: VS:  BP (!) 150/74 (BP Location: Left Arm, Patient Position: Sitting, Cuff Size: Normal)   Pulse (!) 44   Ht 5\' 9"  (1.753 m)   Wt 183 lb (83 kg)   SpO2 96%   BMI 27.02 kg/m  , BMI Body mass index is 27.02 kg/m. Constitutional:  oriented to person, place, and time. No distress.  HENT:  Head: Grossly normal Eyes:  no discharge.  No scleral icterus.  Neck: No JVD, no carotid bruits  Cardiovascular: Regular rate and rhythm, no murmurs appreciated Pulmonary/Chest: Clear to auscultation bilaterally, no wheezes or rails Abdominal: Soft.  no distension.  no tenderness.  Musculoskeletal: Normal range of motion Neurological:  normal muscle tone. Coordination normal. No atrophy Skin: Skin warm and dry Psychiatric: normal affect, pleasant   Recent Labs: 02/22/2019: Magnesium 2.2 10/02/2019: BUN 19; Creatinine, Ser 0.85; Hemoglobin 15.3; Platelets 197; Potassium 4.4; Sodium 140    Lipid Panel Lab Results  Component Value Date   CHOL 140 10/04/2018   HDL 33.60 (L) 10/04/2018   LDLCALC 76 10/04/2018   TRIG 152.0 (H) 10/04/2018    Wt Readings from Last 3 Encounters:  12/30/19 183 lb (83 kg)  12/11/19 182 lb 8 oz (82.8 kg)  11/21/19 180 lb (81.6 kg)     ASSESSMENT AND PLAN:  Mixed hyperlipidemia  Cholesterol is at goal on the current lipid regimen. No changes to the medications were made.  Essential hypertension - Blood pressure is well controlled on today's visit. No changes made to the medications.  Spinal stenosis, lumbar region, with neurogenic claudication -  Chronic back pain Stimulator in place, Wife reports he is scheduled for ablation  in his back  Bradycardia Followed by EP Blood pressure stable, no orthostasis symptoms Wife will check his pulse rate when he has these episodes of " blank staring" to make sure he is not having pauses or profound bradycardia She has a small finger pulse oximeter at home which she will use He has follow-up with EP several months time  Total encounter time more than 25 minutes Greater than 50% was spent in counseling and coordination of care with the patient     No orders of the defined types were placed in this encounter.    Signed, Esmond Plants, M.D., Ph.D. 12/30/2019  [place], [place]

## 2019-12-30 ENCOUNTER — Ambulatory Visit: Payer: Medicare PPO | Admitting: Occupational Therapy

## 2019-12-30 ENCOUNTER — Encounter: Payer: Self-pay | Admitting: Cardiovascular Disease

## 2019-12-30 ENCOUNTER — Ambulatory Visit (INDEPENDENT_AMBULATORY_CARE_PROVIDER_SITE_OTHER): Payer: Medicare PPO | Admitting: Cardiovascular Disease

## 2019-12-30 ENCOUNTER — Other Ambulatory Visit: Payer: Self-pay

## 2019-12-30 ENCOUNTER — Encounter: Payer: Self-pay | Admitting: Occupational Therapy

## 2019-12-30 ENCOUNTER — Ambulatory Visit: Payer: Medicare PPO

## 2019-12-30 VITALS — BP 150/74 | HR 44 | Ht 69.0 in | Wt 183.0 lb

## 2019-12-30 DIAGNOSIS — I951 Orthostatic hypotension: Secondary | ICD-10-CM | POA: Diagnosis not present

## 2019-12-30 DIAGNOSIS — R278 Other lack of coordination: Secondary | ICD-10-CM

## 2019-12-30 DIAGNOSIS — M25551 Pain in right hip: Secondary | ICD-10-CM | POA: Diagnosis not present

## 2019-12-30 DIAGNOSIS — I471 Supraventricular tachycardia: Secondary | ICD-10-CM

## 2019-12-30 DIAGNOSIS — R001 Bradycardia, unspecified: Secondary | ICD-10-CM

## 2019-12-30 DIAGNOSIS — M6281 Muscle weakness (generalized): Secondary | ICD-10-CM | POA: Diagnosis not present

## 2019-12-30 DIAGNOSIS — G20 Parkinson's disease: Secondary | ICD-10-CM

## 2019-12-30 DIAGNOSIS — R2681 Unsteadiness on feet: Secondary | ICD-10-CM

## 2019-12-30 NOTE — Therapy (Signed)
[place] MAIN [facility] SERVICES [address] [place], [place], [address] Phone: [phone]   Fax:  [phone]  Occupational Therapy Treatment  Patient Details  Name: [name] MRN: [id number] Date of Birth: 26-Jul-1950 No data recorded  Encounter Date: 12/30/2019   OT End of Session - 12/30/19 1126    Visit Number 4    Number of Visits 24    Date for OT Re-Evaluation 03/06/20    Authorization Type reporting period starting 12/11/2019    OT Start Time 1105    OT Stop Time 1145    OT Time Calculation (min) 40 min    Activity Tolerance Patient tolerated treatment well    Behavior During Therapy [facility] for tasks assessed/performed           Past Medical History:  Diagnosis Date  . Arthritis   . Bradycardia   . Cancer [facility]) 2013   skin cancer  . Depression    ptsd  . Dysrhythmia    chronic slow heart rate  . GERD (gastroesophageal reflux disease)   . Headache(784.0)    tension headaches non recent  . History of chicken pox   . History of kidney stones    passed  . Hypertension    treated with HCTZ  . Parkinson's disease ([place])    dx'ed 15 years ago  . PTSD (post-traumatic stress disorder)   . Shortness of breath dyspnea   . Sleep apnea    doesn't use C-pap  . Varicose veins     Past Surgical History:  Procedure Laterality Date  . CHOLECYSTECTOMY N/A 10/22/2014   Procedure: LAPAROSCOPIC CHOLECYSTECTOMY WITH INTRAOPERATIVE CHOLANGIOGRAM;  Surgeon: Dia [place] III, MD;  Location: [facility] ORS;  Service: General;  Laterality: N/A;  . cyst removed      from lip as a child  . INTRAMEDULLARY (IM) NAIL INTERTROCHANTERIC N/A 02/18/2019   Procedure: INTRAMEDULLARY (IM) NAIL INTERTROCHANTRIC, RIGHT,;  Surgeon: Thornton Park, MD;  Location: [facility] ORS;  Service: Orthopedics;  Laterality: N/A;  . LUMBAR LAMINECTOMY/DECOMPRESSION MICRODISCECTOMY Bilateral 12/14/2012   Procedure: Bilateral lumbar three-four, four-five decompressive  laminotomy/foraminotomy;  Surgeon: Charlie Pitter, MD;  Location: [place] NEURO ORS;  Service: Neurosurgery;  Laterality: Bilateral;  . PULSE GENERATOR IMPLANT Bilateral 12/13/2013   Procedure: Bilateral implantable pulse generator placement;  Surgeon: Erline Levine, MD;  Location: [place] NEURO ORS;  Service: Neurosurgery;  Laterality: Bilateral;  Bilateral implantable pulse generator placement  . skin cancer removed     from ears,   12 lft arm  rt leg 15  . SUBTHALAMIC STIMULATOR BATTERY REPLACEMENT Bilateral 07/14/2017   Procedure: BILATERAL IMPLANTED PULSE GENERATOR CHANGE FOR DEEP BRAIN STIMULATOR;  Surgeon: Erline Levine, MD;  Location: [place];  Service: Neurosurgery;  Laterality: Bilateral;  . SUBTHALAMIC STIMULATOR INSERTION Bilateral 12/06/2013   Procedure: SUBTHALAMIC STIMULATOR INSERTION;  Surgeon: Erline Levine, MD;  Location: [place] NEURO ORS;  Service: Neurosurgery;  Laterality: Bilateral;  Bilateral deep brain stimulator placement    There were no vitals filed for this visit.   Subjective Assessment - 12/30/19 1125    Subjective  Pt. reports have a good weekend.    Patient is accompanied by: Family member    Pertinent History Pt. is a 69 yo male who has a history of Parkinson's Disease. Pt. has a Deep Brain Stimulator in place. Pt. has a history of Low Back Pain with 2 back surgeries in August 2014, and February 2016. Pt. has a history of multiple frequent falls, unsure of  how many falls in the last 6 months.    Patient Stated Goals Pt would like to be more independent daily.    Currently in Pain? Yes    Pain Score 4     Pain Location Back    Pain Orientation Lower;Right    Pain Descriptors / Indicators Aching            OT TREATMENT   Therapeutic Exercise:  Pt. performed 2# dowel ex. For UE strengthening secondary to weakness. Bilateral shoulder flexion, chest press, circular patterns, and elbow flexion/extension were performed.3# dumbbell ex. for forearm supination/pronation, wrist  flexion/extension. Pt. requires rest breaks and verbal cues, tactile cues, and cues for visual demonstration for proper technique. Pt. performed intrinsic stretches.  Manual Therapy:  Pt. tolerated retrograde massage to the bilateral hand, for edema control with the BUE elevated secondary to increased edema Education was provided to the pt., and his wife about retrograde massage for edema control, positioning, and AROM. Pt. tolerated soft tissue mobilizations for metacarpal spread stretches, and carpal rolls.    Pt. 's wife reports that pt. Has an appointment today with his physician. Pt. Tolerated manual techniques for edemas control in his hands. Pt. Was assisted with positioning with a pillow at his back secondary to reports of 4/10 back pain. Pt. Pt. Required verbal, and visual cues for proper there. Ex. Form, and pace. Pt. Continues to work on improving UE strength,a nd [facility] skill in order to work towards improving, and maximizing independence with ADLs, and IADLs.                          OT Education - 12/30/19 1126    Education provided Yes    Person(s) Educated Patient;Spouse    Methods Explanation    Comprehension Verbalized understanding               OT Long Term Goals - 12/13/19 2046      OT LONG TERM GOAL #1   Title Pt. will improve Bilateral UE strength by 1 mm grade to assist with ADLs, and IADLs    Baseline BUE strength 4/5 overall at eval    Time 12    Period Weeks    Status New    Target Date 03/06/20      OT LONG TERM GOAL #2   Title Pt. will improve grip strength by 10# to open jars and containers.    Baseline difficulty at eval    Time 12    Period Weeks    Status New    Target Date 03/06/20      OT LONG TERM GOAL #3   Title Patient will complete lower body dressing with modified independence.    Baseline min to mod assist at eval    Time 12    Period Weeks    Status New    Target Date 03/06/20      OT LONG TERM GOAL #4    Title Patient to demonstrate the ability to vacuum flooring at home with use of modified techniques with supervision.    Baseline difficulty at eval    Time 12    Period Weeks    Status New    Target Date 03/06/20      OT LONG TERM GOAL #5   Title Patient will demonstrate the ability to retrieve a snack and transport it to the living room with modified independence.    Baseline unable at eval  Time 12    Period Weeks    Status New    Target Date 03/06/20      OT LONG TERM GOAL #6   Title Patient will improve hand function to be able to perform cutting of food with modified independence.    Baseline wife helps to cut food when needed.    Time 8    Period Weeks    Status New    Target Date 02/05/20      OT LONG TERM GOAL #7   Title Pt. will write a paragraph efficiently with 90% legibility.    Baseline decreased legibility and poor hand endurance for writing more than 1 sentence.    Time 8    Period Weeks    Status New    Target Date 02/05/20      OT LONG TERM GOAL  #10   TITLE Pt will don and doff pants with supervision    Baseline min assist to manage pants    Time 12    Period Weeks    Status New    Target Date 03/06/20                 Plan - 12/30/19 1127    Clinical Impression Statement Pt. 's wife reports that pt. Has an appointment today with his physician. Pt. Tolerated manual techniques for edemas control in his hands. Pt. Was assisted with positioning with a pillow at his back secondary to reports of 4/10 back pain. Pt. Pt. Required verbal, and visual cues for proper there. Ex. Form, and pace. Pt. Continues to work on improving UE strength,a nd [facility] skill in order to work towards improving, and maximizing independence with ADLs, and IADLs.   OT Occupational Profile and History Detailed Assessment- Review of Records and additional review of physical, cognitive, psychosocial history related to current functional performance    Occupational performance  deficits (Please refer to evaluation for details): ADL's;IADL's;Leisure    Body Structure / Function / Physical Skills ADL;Coordination;Endurance;GMC;UE functional use;Balance;Decreased knowledge of use of DME;IADL;Pain;Dexterity;FMC;Strength;Mobility;ROM    Cognitive Skills Attention;Problem Solve;Memory    Psychosocial Skills Coping Strategies;Environmental  Adaptations;Habits;Routines and Behaviors    Rehab Potential Good    Clinical Decision Making Limited treatment options, no task modification necessary    Comorbidities Affecting Occupational Performance: Presence of comorbidities impacting occupational performance    Comorbidities impacting occupational performance description: risk for falls, slow to respond and process information at times, safety    OT Frequency 2x / week    OT Duration 12 weeks    OT Treatment/Interventions Self-care/ADL training;Therapeutic exercise;DME and/or AE instruction;Energy conservation;Neuromuscular education;Patient/family education;Therapeutic activities;Moist Heat;Functional Mobility Training;Cognitive remediation/compensation    Consulted and Agree with Plan of Care Patient           Patient will benefit from skilled therapeutic intervention in order to improve the following deficits and impairments:   Body Structure / Function / Physical Skills: ADL, Coordination, Endurance, GMC, UE functional use, Balance, Decreased knowledge of use of DME, IADL, Pain, Dexterity, FMC, Strength, Mobility, ROM Cognitive Skills: Attention, Problem Solve, Memory Psychosocial Skills: Coping Strategies, Environmental  Adaptations, Habits, Routines and Behaviors   Visit Diagnosis: Muscle weakness (generalized)  Other lack of coordination    Problem List Patient Active Problem List   Diagnosis Date Noted  . E. coli bacteremia 02/23/2019  . Dysphagia 02/23/2019  . Aspiration pneumonia ([place]) 02/21/2019  . Closed right hip fracture ([place]) 02/16/2019  . Rib pain on  right side 09/25/2018  .  Skin sore 12/01/2017  . Hematoma 10/18/2017  . Fall at home 10/18/2017  . 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 Radicular pain in right arm 10/09/2016  . GERD (gastroesophageal reflux disease) 07/31/2016  . Trochanteric bursitis 03/03/2016  . Left hand pain 10/30/2015  . Fracture, finger, multiple sites 10/30/2015  . Colon cancer screening 07/23/2015  . Healthcare maintenance 07/23/2015  . Gout 02/18/2015  . Depression 01/06/2015  . Irritation of eyelid 08/20/2014  . Dupuytren's contracture 08/20/2014  . Cough 07/21/2014  . Lumbar stenosis with neurogenic claudication 06/13/2014  . Preop cardiovascular exam 05/28/2014  . S/P deep brain stimulator placement 05/08/2014  . Joint pain 01/22/2014  . Medicare annual wellness visit, initial 01/19/2014  . Advance care planning 01/19/2014  . Parkinson's disease ([place]) 12/13/2013  . PTSD (post-traumatic stress disorder) 06/13/2013  . Erectile dysfunction 06/13/2013  . HLD (hyperlipidemia) 06/13/2013  . Hip pain 06/10/2013  . Pain in joint, shoulder region 06/10/2013  . Right leg swelling 06/03/2013  . Essential hypertension 06/03/2013  . Bradycardia by electrocardiogram 06/03/2013  . Obstructive sleep apnea 03/12/2013  . Spinal stenosis, lumbar region, with neurogenic claudication 12/14/2012    Harrel Carina, MS, OTR/L 12/30/2019, 11:30 AM  [place] MAIN [facility] SERVICES [place], [place], [address] Phone: [phone]   Fax:  [phone]  Name: [name] MRN: [id number] Date of Birth: [birth year]

## 2019-12-30 NOTE — Patient Instructions (Signed)
Medication Instructions:  No changes  If you need a refill on your cardiac medications before your next appointment, please call your pharmacy.    Lab work: No new labs needed   If you have labs (blood work) drawn today and your tests are completely normal, you will receive your results only by: . MyChart Message (if you have MyChart) OR . A paper copy in the mail If you have any lab test that is abnormal or we need to change your treatment, we will call you to review the results.   Testing/Procedures: No new testing needed   Follow-Up: At [facility], you and your health needs are our priority.  As part of our continuing mission to provide you with exceptional heart care, we have created designated Provider Care Teams.  These Care Teams include your primary Cardiologist (physician) and Advanced Practice Providers (APPs -  Physician Assistants and Nurse Practitioners) who all work together to provide you with the care you need, when you need it.  . You will need a follow up appointment in 12 months  . Providers on your designated Care Team:   . Christopher Berge, NP . Ryan Dunn, PA-C . Jacquelyn Visser, PA-C  Any Other Special Instructions Will Be Listed Below (If Applicable).  COVID-19 Vaccine Information can be found at: [email or web address] For questions related to vaccine distribution or appointments, please email [email or web address] or call [phone].     

## 2019-12-30 NOTE — Therapy (Signed)
[facility] MAIN [facility] SERVICES [address] [place], [place], [address] Phone: [phone]   Fax:  [phone]  Physical Therapy Treatment  Patient Details  Name: [name] MRN: [id number] Date of Birth: 1950-05-29 Referring Provider (PT): Si Gaul MD    Encounter Date: 12/30/2019   PT End of Session - 12/30/19 1111    Visit Number 28    Number of Visits 40    Date for PT Re-Evaluation 02/03/20    Authorization Type 8/10 PN 8/9    Authorization Time Period FOTO- PT    PT Start Time 1016    PT Stop Time 1059    PT Time Calculation (min) 43 min    Equipment Utilized During Treatment Gait belt    Activity Tolerance Patient tolerated treatment well;No increased pain    Behavior During Therapy WFL for tasks assessed/performed           Past Medical History:  Diagnosis Date  . Arthritis   . Bradycardia   . Cancer [facility]) 2013   skin cancer  . Depression    ptsd  . Dysrhythmia    chronic slow heart rate  . GERD (gastroesophageal reflux disease)   . Headache(784.0)    tension headaches non recent  . History of chicken pox   . History of kidney stones    passed  . Hypertension    treated with HCTZ  . Parkinson's disease (HCC)    dx'ed 15 years ago  . PTSD (post-traumatic stress disorder)   . Shortness of breath dyspnea   . Sleep apnea    doesn't use C-pap  . Varicose veins     Past Surgical History:  Procedure Laterality Date  . CHOLECYSTECTOMY N/A 10/22/2014   Procedure: LAPAROSCOPIC CHOLECYSTECTOMY WITH INTRAOPERATIVE CHOLANGIOGRAM;  Surgeon: Tiney Rouge III, MD;  Location: [facility] ORS;  Service: General;  Laterality: N/A;  . cyst removed      from lip as a child  . INTRAMEDULLARY (IM) NAIL INTERTROCHANTERIC N/A 02/18/2019   Procedure: INTRAMEDULLARY (IM) NAIL INTERTROCHANTRIC, RIGHT,;  Surgeon: Juanell Fairly, MD;  Location: [facility] ORS;  Service: Orthopedics;  Laterality: N/A;  . LUMBAR LAMINECTOMY/DECOMPRESSION  MICRODISCECTOMY Bilateral 12/14/2012   Procedure: Bilateral lumbar three-four, four-five decompressive laminotomy/foraminotomy;  Surgeon: Temple Pacini, MD;  Location: [facility];  Service: Neurosurgery;  Laterality: Bilateral;  . PULSE GENERATOR IMPLANT Bilateral 12/13/2013   Procedure: Bilateral implantable pulse generator placement;  Surgeon: Maeola Harman, MD;  Location: [facility] OR[facility];  Service: Neurosurgery;  Laterality: Bilateral;  Bilateral implantable pulse generator placement  . skin cancer removed     from ears,   12 lft arm  rt leg 15  . SUBTHALAMIC STIMULATOR BATTERY REPLACEMENT Bilateral 07/14/2017   Procedure: BILATERAL IMPLANTED PULSE GENERATOR CHANGE FOR DEEP BRAIN STIMULATOR;  Surgeon: Maeola Harman, MD;  Location: [facility] OR;  Service: Neurosurgery;  Laterality: Bilateral;  . SUBTHALAMIC STIMULATOR INSERTION Bilateral 12/06/2013   Procedure: SUBTHALAMIC STIMULATOR INSERTION;  Surgeon: Maeola Harman, MD;  Location: [facility] OR[facility];  Service: Neurosurgery;  Laterality: Bilateral;  Bilateral deep brain stimulator placement    There were no vitals filed for this visit.   Subjective Assessment - 12/30/19 1104    Subjective Patient reports having increased pain in low back after forward reaching. No falls since last session but has been limited in movement since then.    Patient is accompained by: Family member    Pertinent History [name] is a 69 y.o. male with  a hx of HTN, bradycardia, Parkinson's s/p deep brain stimulator 11/2013 , HLD, depression, possible seizure disorder, arthritis, depression/PTSD, dysrhythmia, GERD, sleep apnea. Patient was seen by this therapist in years past. Patient had a fall around Halloween of 2020 and admitted for ORIF on 02/18/19, was discharged to SNF and had [facility] around Christmas. Had home health therapy for a month and a half. After that tried to go to Outpatient Therapy for 3 sessions at [facility] and was not pleased due to how busy and crowded the  location was. Fell last week in bathroom, didn't take walker in, falls about 2x month. Is very weak in RLE. Patient has a new lift chair and U drive walking. Has a caregiver 4 days a week. Has additional order for radiculopathy, lumbar region. PMH of lumbar laminectomy/decompression microdiscectomy (L3-4, 4-5)  In 2014.  Lumbar 4-5 cage +rod (2016).    Limitations Walking;Standing;Sitting;House hold activities;Lifting    How long can you sit comfortably? not limited with a backrest, without a back rest 3 minutes    How long can you stand comfortably? 3-5 minutes with holding on.    Patient Stated Goals pain reduction of R hip. strength of legs, dance with wife. push lawnmower.    Currently in Pain? Yes    Pain Score 4     Pain Location Back    Pain Orientation Lower;Right    Pain Descriptors / Indicators Aching    Pain Type Chronic pain    Pain Onset More than a month ago    Pain Frequency Intermittent                 Supine: Hamstring stretch  x 60 seconds each leg. PT educates caregiver on proper form and execution of task.    R hip inferior distraction with belt SAD (short arc distraction) for pain management. 30 second hold 6x.    R hip lateral distraction (SAD) with belt 6x 30 seconds    Hooklying TA bracing with green physioball to promote core stabilization. PT cues for breathing and correct form. 10x with 3 second holds.   GTB hip abduction 15x cues for increased muscle activation of RLE  Hip isometric abduction into PT hand 15x 6 second hold   Sidelying:  Distraction with R pelvic purchase inferior direction, stabilization to proximal ribcage with rotational pattern for pain relief 10x 20 second holds Gentle grade I mobilizations to spine for pain reduction 10x 2 seconds each level    Seated:  Inferior glide on R pelvis with L sidebend AROM 10x 3 second holds for back pain relief    STM lumbar paraspinals bilaterally with pt seated d/t shoulder pain in prone.  Implementation of effleurage and ptrissage for muscle tension reduction and pain reduction.    BP taken seated and standing: recorded by wife for physician.      Pt educated throughout session about proper posture and technique with exercises. Improved exercise technique, movement at target joints, use of target muscles after min to mod verbal, visual, tactile cues                     PT Education - 12/30/19 1109    Education provided Yes    Education Details body mechanics, pain relief, manual    Person(s) Educated Patient    Methods Explanation;Demonstration;Tactile cues;Verbal cues    Comprehension Verbalized understanding;Returned demonstration;Verbal cues required;Tactile cues required            PT Short  Term Goals - 12/09/19 1247      PT SHORT TERM GOAL #1   Title Pt will perform HEP with family's/aide's supervision, for improved balance, transfers, and gait.    Baseline 5/5: HEP given 6/28: HEP compliant 8/23: aide helps with HEP    Time 4    Period Weeks    Status Achieved    Target Date 09/19/19             PT Long Term Goals - 12/09/19 1248      PT LONG TERM GOAL #1   Title Patient (> 11 years old) will complete five times sit to stand test in < 15 seconds without UE support without LOB indicating an increased LE strength and improved balance.    Baseline 5/5: 19 seconds one posterior LOB 6/28: 12.32 no UE support    Time 8    Period Weeks    Status Achieved      PT LONG TERM GOAL #2   Title Patient will increase 10 meter walk test to >1.48m/s as to improve gait speed for better community ambulation and to reduce fall risk.    Baseline 4/4: 0.71 m/s with U step walker 6/28: 0.917 m/s with U step rollator 8/23: >1.0 m/s with rollator    Time 8    Period Weeks    Status Achieved      PT LONG TERM GOAL #3   Title Patient will increase Berg Balance score by > 6 points (32/56) to demonstrate decreased fall risk during functional activities.     Baseline 5/5: 26/56 6/28: 36/56    Time 8    Period Weeks    Status Achieved      PT LONG TERM GOAL #4   Title Patient will increase FOTO score to equal to or greater than 31/100  to demonstrate statistically significant improvement in mobility and quality of life.    Baseline 5/5: 12/100 6/28: 38% 8/23: 11.7    Time 8    Period Weeks    Status On-going    Target Date 02/03/20      PT LONG TERM GOAL #5   Title Patient will increase six minute walk test distance to >1000 for progression to community ambulator and improve gait ability    Baseline 6/28: 750 ft with U step rollator 8/23: 775 w rolling walker, increased crouch pattern of ambulation    Time 8    Period Weeks    Status Partially Met    Target Date 02/03/20      PT LONG TERM GOAL #6   Title Patient will increase Berg Balance score by > 6 points (42/56) to demonstrate decreased fall risk during functional activities.    Baseline 6/28: 36/56 8/23: 42/56    Time 8    Period Weeks    Status Partially Met    Target Date 02/03/20      PT LONG TERM GOAL #7   Title Patient will report a worst pain of 3/10 on VAS in low back to improve tolerance with ADLs and reduced symptoms with activities.    Baseline 8/9: 9/10 8/23: 7/10 pain    Time 8    Period Weeks    Status Partially Met    Target Date 02/03/20      PT LONG TERM GOAL #8   Title Patient will reduce modified Oswestry score to <20 as to demonstrate minimal disability with ADLs including improved sleeping tolerance, walking/sitting tolerance etc for better mobility with  ADLs.    Baseline 8/9: 64% 8/23: 66%    Time 8    Period Weeks    Status On-going    Target Date 02/03/20                 Plan - 12/30/19 1557    Clinical Impression Statement Patient's session is limited today be recent episode of increased back pain. Patient reports relief of symptoms by end of session with distraction providing the most relief. Pt maintains high level of focus and  motivation. Extensive verbal, visual, and tactile cues are provided for most accurate form possible.Patient would benefit from skilled physical therapy to increase strength, stability, balance, and decrease pain to improve quality of life.    Personal Factors and Comorbidities Age;Comorbidity 3+;Past/Current Experience;Time since onset of injury/illness/exacerbation;Transportation    Comorbidities athritis, bradycardia, cancer, depression, dysrhythmia, GERD, HTN, Parkinson's, PTSD, SOB, sleep apnea    Examination-Activity Limitations Bathing;Bed Mobility;Caring for Others;Bend;Dressing;Lift;Locomotion Level;Reach Overhead;Sit;Squat;Stairs;Stand;Toileting;Transfers    Examination-Participation Restrictions Church;Cleaning;Community Activity;Interpersonal Relationship;Shop;Volunteer;Yard Work;Other    Stability/Clinical Decision Making Stable/Uncomplicated    Rehab Potential Fair    PT Frequency 2x / week    PT Duration 8 weeks    PT Treatment/Interventions ADLs/Self Care Home Management;Aquatic Therapy;Cryotherapy;Electrical Stimulation;Iontophoresis 4mg /ml Dexamethasone;Moist Heat;Ultrasound;Contrast Bath;DME Instruction;Gait training;Stair training;Functional mobility training;Therapeutic activities;Therapeutic exercise;Balance training;Neuromuscular re-education;Patient/family education;Manual techniques;Wheelchair mobility training;Energy conservation;Passive range of motion;Traction;Orthotic Fit/Training;Dry needling;Vestibular;Visual/perceptual remediation/compensation;Taping;Canalith Repostioning    PT Next Visit Plan core and pain relief    PT Home Exercise Plan [place] and Agree with Plan of Care Patient;Family member/caregiver    Family Member Consulted wife           Patient will benefit from skilled therapeutic intervention in order to improve the following deficits and impairments:  Abnormal gait, Decreased balance, Decreased coordination, Decreased mobility, Impaired  tone, Postural dysfunction, Decreased strength, Decreased safety awareness, Improper body mechanics, Impaired flexibility, Decreased activity tolerance, Decreased endurance, Decreased knowledge of precautions, Difficulty walking, Pain, Cardiopulmonary status limiting activity, Decreased range of motion, Impaired perceived functional ability  Visit Diagnosis: Muscle weakness (generalized)  Other lack of coordination  Parkinson's disease (HCC)  Unsteadiness on feet     Problem List Patient Active Problem List   Diagnosis Date Noted  . E. coli bacteremia 02/23/2019  . Dysphagia 02/23/2019  . Aspiration pneumonia ([place]) 02/21/2019  . Closed right hip fracture ([place]) 02/16/2019  . Rib pain on right side 09/25/2018  . Skin sore 12/01/2017  . Hematoma 10/18/2017  . Fall at home 10/18/2017  . 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 Radicular pain in right arm 10/09/2016  . GERD (gastroesophageal reflux disease) 07/31/2016  . Trochanteric bursitis 03/03/2016  . Left hand pain 10/30/2015  . Fracture, finger, multiple sites 10/30/2015  . Colon cancer screening 07/23/2015  . Healthcare maintenance 07/23/2015  . Gout 02/18/2015  . Depression 01/06/2015  . Irritation of eyelid 08/20/2014  . Dupuytren's contracture 08/20/2014  . Cough 07/21/2014  . Lumbar stenosis with neurogenic claudication 06/13/2014  . Preop cardiovascular exam 05/28/2014  . S/P deep brain stimulator placement 05/08/2014  . Joint pain 01/22/2014  . Medicare annual wellness visit, initial 01/19/2014  . Advance care planning 01/19/2014  . Parkinson's disease ([place]) 12/13/2013  . PTSD (post-traumatic stress disorder) 06/13/2013  . Erectile dysfunction 06/13/2013  . HLD (hyperlipidemia) 06/13/2013  . Hip pain 06/10/2013  . Pain in joint, shoulder region 06/10/2013  . Right leg swelling 06/03/2013  . Essential hypertension 06/03/2013  . Bradycardia by electrocardiogram 06/03/2013  .  Obstructive sleep apnea 03/12/2013   . Spinal stenosis, lumbar region, with neurogenic claudication 12/14/2012   Janna Arch, PT, DPT   12/30/2019, 3:59 PM  [place] MAIN [facility] SERVICES [address] [place], [place], [address] Phone: [phone]   Fax:  [phone]  Name: MAXTON NOREEN MRN: [id number] Date of Birth: 03-01-1951

## 2020-01-01 ENCOUNTER — Ambulatory Visit: Payer: Medicare PPO

## 2020-01-01 ENCOUNTER — Ambulatory Visit: Payer: Medicare PPO | Admitting: Occupational Therapy

## 2020-01-01 ENCOUNTER — Ambulatory Visit (INDEPENDENT_AMBULATORY_CARE_PROVIDER_SITE_OTHER): Payer: Medicare PPO | Admitting: Psychology

## 2020-01-01 DIAGNOSIS — F4323 Adjustment disorder with mixed anxiety and depressed mood: Secondary | ICD-10-CM

## 2020-01-02 DIAGNOSIS — R519 Headache, unspecified: Secondary | ICD-10-CM | POA: Diagnosis not present

## 2020-01-06 ENCOUNTER — Other Ambulatory Visit: Payer: Self-pay

## 2020-01-06 ENCOUNTER — Ambulatory Visit: Payer: Medicare PPO | Admitting: Occupational Therapy

## 2020-01-06 ENCOUNTER — Encounter: Payer: Self-pay | Admitting: Occupational Therapy

## 2020-01-06 ENCOUNTER — Ambulatory Visit: Payer: Medicare PPO

## 2020-01-06 DIAGNOSIS — M6281 Muscle weakness (generalized): Secondary | ICD-10-CM | POA: Diagnosis not present

## 2020-01-06 DIAGNOSIS — G20 Parkinson's disease: Secondary | ICD-10-CM | POA: Diagnosis not present

## 2020-01-06 DIAGNOSIS — R278 Other lack of coordination: Secondary | ICD-10-CM

## 2020-01-06 DIAGNOSIS — M25551 Pain in right hip: Secondary | ICD-10-CM | POA: Diagnosis not present

## 2020-01-06 DIAGNOSIS — R2681 Unsteadiness on feet: Secondary | ICD-10-CM

## 2020-01-06 NOTE — Therapy (Signed)
[place] MAIN [facility] SERVICES [address] [place], [place], [address] Phone: [phone]   Fax:  [phone]  Occupational Therapy Treatment  Patient Details  Name: [name] MRN: [id number] Date of Birth: [birth year] No data recorded  Encounter Date: 01/06/2020   OT End of Session - 01/06/20 1052    Visit Number 5    Number of Visits 24    Date for OT Re-Evaluation 03/06/20    Authorization Type reporting period starting 12/11/2019    OT Start Time 0932    OT Stop Time 1015    OT Time Calculation (min) 43 min    Activity Tolerance Patient tolerated treatment well    Behavior During Therapy [facility] for tasks assessed/performed           Past Medical History:  Diagnosis Date  . Arthritis   . Bradycardia   . Cancer [facility]) 2013   skin cancer  . Depression    ptsd  . Dysrhythmia    chronic slow heart rate  . GERD (gastroesophageal reflux disease)   . Headache(784.0)    tension headaches non recent  . History of chicken pox   . History of kidney stones    passed  . Hypertension    treated with HCTZ  . Parkinson's disease ([place])    dx'ed 15 years ago  . PTSD (post-traumatic stress disorder)   . Shortness of breath dyspnea   . Sleep apnea    doesn't use C-pap  . Varicose veins     Past Surgical History:  Procedure Laterality Date  . CHOLECYSTECTOMY N/A 10/22/2014   Procedure: LAPAROSCOPIC CHOLECYSTECTOMY WITH INTRAOPERATIVE CHOLANGIOGRAM;  Surgeon: Dia Crawford III, MD;  Location: [facility] ORS;  Service: General;  Laterality: N/A;  . cyst removed      from lip as a child  . INTRAMEDULLARY (IM) NAIL INTERTROCHANTERIC N/A 02/18/2019   Procedure: INTRAMEDULLARY (IM) NAIL INTERTROCHANTRIC, RIGHT,;  Surgeon: Thornton Park, MD;  Location: [facility] ORS;  Service: Orthopedics;  Laterality: N/A;  . LUMBAR LAMINECTOMY/DECOMPRESSION MICRODISCECTOMY Bilateral 12/14/2012   Procedure: Bilateral lumbar three-four, four-five decompressive  laminotomy/foraminotomy;  Surgeon: Charlie Pitter, MD;  Location: [place] NEURO ORS;  Service: Neurosurgery;  Laterality: Bilateral;  . PULSE GENERATOR IMPLANT Bilateral 12/13/2013   Procedure: Bilateral implantable pulse generator placement;  Surgeon: Erline Levine, MD;  Location: [place] NEURO ORS;  Service: Neurosurgery;  Laterality: Bilateral;  Bilateral implantable pulse generator placement  . skin cancer removed     from ears,   12 lft arm  rt leg 15  . SUBTHALAMIC STIMULATOR BATTERY REPLACEMENT Bilateral 07/14/2017   Procedure: BILATERAL IMPLANTED PULSE GENERATOR CHANGE FOR DEEP BRAIN STIMULATOR;  Surgeon: Erline Levine, MD;  Location: [place];  Service: Neurosurgery;  Laterality: Bilateral;  . SUBTHALAMIC STIMULATOR INSERTION Bilateral 12/06/2013   Procedure: SUBTHALAMIC STIMULATOR INSERTION;  Surgeon: Erline Levine, MD;  Location: [place] NEURO ORS;  Service: Neurosurgery;  Laterality: Bilateral;  Bilateral deep brain stimulator placement    There were no vitals filed for this visit.   Therapeutic Exercise:  Pt. performed 2# dowel ex. For UE strengthening secondary to weakness. Bilateral shoulder flexion, chest press, circular patterns, and elbow flexion/extension were performed.3# dumbbell ex. for forearm supination/pronation, wrist flexion/extension. Pt. requires rest breaks and verbal cues, tactile cues, and cues for visual demonstrationfor proper technique.Pt. performed intrinsic stretches.  Self care:  Pt. was provided with Dycem for increased stabilization of dishes on the table while eating. Pt. maintained a mature grasp on the fork,  and completed hand to mouth patterns without difficulty. Pt.'s wife reports that the pt. often alternates his grasp on the utensil with the fork handle between his 3rd, and 4th digits.     Pt.'s wife report that the patient is having a procedure for his back on Friday, followed by an EEG. Edma in the bilateral hands was improved today. Pt. was assisted with positioning  with a pillow at his back secondary to reports of 6/10 back pain. Pt. was able to complete hand to mouth patterns without difficulty. Pt. required verbal, and visual cues for proper there. Ex. form, and pace. Pt. continues to work on improving UE strength, [facility] skill in order to work towards improving, and maximizing independence with ADLs, and IADLs.                        OT Education - 01/06/20 1052    Education provided Yes    Person(s) Educated Patient;Spouse    Methods Explanation    Comprehension Verbalized understanding               OT Long Term Goals - 12/13/19 2046      OT LONG TERM GOAL #1   Title Pt. will improve Bilateral UE strength by 1 mm grade to assist with ADLs, and IADLs    Baseline BUE strength 4/5 overall at eval    Time 12    Period Weeks    Status New    Target Date 03/06/20      OT LONG TERM GOAL #2   Title Pt. will improve grip strength by 10# to open jars and containers.    Baseline difficulty at eval    Time 12    Period Weeks    Status New    Target Date 03/06/20      OT LONG TERM GOAL #3   Title Patient will complete lower body dressing with modified independence.    Baseline min to mod assist at eval    Time 12    Period Weeks    Status New    Target Date 03/06/20      OT LONG TERM GOAL #4   Title Patient to demonstrate the ability to vacuum flooring at home with use of modified techniques with supervision.    Baseline difficulty at eval    Time 12    Period Weeks    Status New    Target Date 03/06/20      OT LONG TERM GOAL #5   Title Patient will demonstrate the ability to retrieve a snack and transport it to the living room with modified independence.    Baseline unable at eval    Time 12    Period Weeks    Status New    Target Date 03/06/20      OT LONG TERM GOAL #6   Title Patient will improve hand function to be able to perform cutting of food with modified independence.    Baseline wife helps to cut  food when needed.    Time 8    Period Weeks    Status New    Target Date 02/05/20      OT LONG TERM GOAL #7   Title Pt. will write a paragraph efficiently with 90% legibility.    Baseline decreased legibility and poor hand endurance for writing more than 1 sentence.    Time 8    Period Weeks    Status New  Target Date 02/05/20      OT LONG TERM GOAL  #10   TITLE Pt will don and doff pants with supervision    Baseline min assist to manage pants    Time 12    Period Weeks    Status New    Target Date 03/06/20                 Plan - 01/06/20 1053    Clinical Impression Statement  Pt.'s wife report that the patient is having a procedure for his back on Friday, followed by an EEG. Edma in the bilateral hands was improved today. Pt. was assisted with positioning with a pillow at his back secondary to reports of 6/10 back pain. Pt. was able to complete hand to mouth patterns without difficulty. Pt. required verbal, and visual cues for proper there. Ex. form, and pace. Pt. continues to work on improving UE strength, [facility] skill in order to work towards improving, and maximizing independence with ADLs, and IADLs.   OT Occupational Profile and History Detailed Assessment- Review of Records and additional review of physical, cognitive, psychosocial history related to current functional performance    Occupational performance deficits (Please refer to evaluation for details): ADL's;IADL's;Leisure    Body Structure / Function / Physical Skills ADL;Coordination;Endurance;GMC;UE functional use;Balance;Decreased knowledge of use of DME;IADL;Pain;Dexterity;FMC;Strength;Mobility;ROM    Cognitive Skills Attention;Problem Solve;Memory    Psychosocial Skills Coping Strategies;Environmental  Adaptations;Habits;Routines and Behaviors    Rehab Potential Good    Clinical Decision Making Limited treatment options, no task modification necessary    Comorbidities Affecting Occupational Performance:  Presence of comorbidities impacting occupational performance    Comorbidities impacting occupational performance description: risk for falls, slow to respond and process information at times, safety    Modification or Assistance to Complete Evaluation  No modification of tasks or assist necessary to complete eval    OT Frequency 2x / week    OT Duration 12 weeks    OT Treatment/Interventions Self-care/ADL training;Therapeutic exercise;DME and/or AE instruction;Energy conservation;Neuromuscular education;Patient/family education;Therapeutic activities;Moist Heat;Functional Mobility Training;Cognitive remediation/compensation    Consulted and Agree with Plan of Care Patient           Patient will benefit from skilled therapeutic intervention in order to improve the following deficits and impairments:   Body Structure / Function / Physical Skills: ADL, Coordination, Endurance, GMC, UE functional use, Balance, Decreased knowledge of use of DME, IADL, Pain, Dexterity, FMC, Strength, Mobility, ROM Cognitive Skills: Attention, Problem Solve, Memory Psychosocial Skills: Coping Strategies, Environmental  Adaptations, Habits, Routines and Behaviors   Visit Diagnosis: Muscle weakness (generalized)  Other lack of coordination    Problem List Patient Active Problem List   Diagnosis Date Noted  . E. coli bacteremia 02/23/2019  . Dysphagia 02/23/2019  . Aspiration pneumonia ([place]) 02/21/2019  . Closed right hip fracture ([place]) 02/16/2019  . Rib pain on right side 09/25/2018  . Skin sore 12/01/2017  . Hematoma 10/18/2017  . Fall at home 10/18/2017  . 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 Radicular pain in right arm 10/09/2016  . GERD (gastroesophageal reflux disease) 07/31/2016  . Trochanteric bursitis 03/03/2016  . Left hand pain 10/30/2015  . Fracture, finger, multiple sites 10/30/2015  . Colon cancer screening 07/23/2015  . Healthcare maintenance 07/23/2015  . Gout 02/18/2015  .  Depression 01/06/2015  . Irritation of eyelid 08/20/2014  . Dupuytren's contracture 08/20/2014  . Cough 07/21/2014  . Lumbar stenosis with neurogenic claudication 06/13/2014  . Preop cardiovascular exam 05/28/2014  .  S/P deep brain stimulator placement 05/08/2014  . Joint pain 01/22/2014  . Medicare annual wellness visit, initial 01/19/2014  . Advance care planning 01/19/2014  . Parkinson's disease ([place]) 12/13/2013  . PTSD (post-traumatic stress disorder) 06/13/2013  . Erectile dysfunction 06/13/2013  . HLD (hyperlipidemia) 06/13/2013  . Hip pain 06/10/2013  . Pain in joint, shoulder region 06/10/2013  . Right leg swelling 06/03/2013  . Essential hypertension 06/03/2013  . Bradycardia by electrocardiogram 06/03/2013  . Obstructive sleep apnea 03/12/2013  . Spinal stenosis, lumbar region, with neurogenic claudication 12/14/2012    Harrel Carina, MS, OTR/L 01/06/2020, 10:56 AM  [place] MAIN [facility] SERVICES [place], [place], [address] Phone: [phone]   Fax:  [phone]  Name: [name] MRN: [id number] Date of Birth: 1950-07-30

## 2020-01-06 NOTE — Therapy (Signed)
[place] MAIN [facility] SERVICES [address] [place], [place], [address] Phone: [phone]   Fax:  [phone]  Physical Therapy Treatment  Patient Details  Name: [name] MRN: [id number] Date of Birth: 25-Apr-1950 Referring Provider (PT): Sharolyn Douglas MD    Encounter Date: 01/06/2020   PT End of Session - 01/06/20 0953    Visit Number 29    Number of Visits 40    Date for PT Re-Evaluation 02/03/20    Authorization Type 9/10 PN 8/9    Authorization Time Period FOTO- PT    PT Start Time 1016    PT Stop Time 1059    PT Time Calculation (min) 43 min    Equipment Utilized During Treatment Gait belt    Activity Tolerance Patient tolerated treatment well;No increased pain    Behavior During Therapy WFL for tasks assessed/performed           Past Medical History:  Diagnosis Date  . Arthritis   . Bradycardia   . Cancer [facility]) 2013   skin cancer  . Depression    ptsd  . Dysrhythmia    chronic slow heart rate  . GERD (gastroesophageal reflux disease)   . Headache(784.0)    tension headaches non recent  . History of chicken pox   . History of kidney stones    passed  . Hypertension    treated with HCTZ  . Parkinson's disease ([place])    dx'ed 15 years ago  . PTSD (post-traumatic stress disorder)   . Shortness of breath dyspnea   . Sleep apnea    doesn't use C-pap  . Varicose veins     Past Surgical History:  Procedure Laterality Date  . CHOLECYSTECTOMY N/A 10/22/2014   Procedure: LAPAROSCOPIC CHOLECYSTECTOMY WITH INTRAOPERATIVE CHOLANGIOGRAM;  Surgeon: Dia Crawford III, MD;  Location: [facility] ORS;  Service: General;  Laterality: N/A;  . cyst removed      from lip as a child  . INTRAMEDULLARY (IM) NAIL INTERTROCHANTERIC N/A 02/18/2019   Procedure: INTRAMEDULLARY (IM) NAIL INTERTROCHANTRIC, RIGHT,;  Surgeon: Thornton Park, MD;  Location: [facility] ORS;  Service: Orthopedics;  Laterality: N/A;  . LUMBAR LAMINECTOMY/DECOMPRESSION  MICRODISCECTOMY Bilateral 12/14/2012   Procedure: Bilateral lumbar three-four, four-five decompressive laminotomy/foraminotomy;  Surgeon: Charlie Pitter, MD;  Location: [place] [facility];  Service: Neurosurgery;  Laterality: Bilateral;  . PULSE GENERATOR IMPLANT Bilateral 12/13/2013   Procedure: Bilateral implantable pulse generator placement;  Surgeon: Erline Levine, MD;  Location: [place] [facility];  Service: Neurosurgery;  Laterality: Bilateral;  Bilateral implantable pulse generator placement  . skin cancer removed     from ears,   12 lft arm  rt leg 15  . SUBTHALAMIC STIMULATOR BATTERY REPLACEMENT Bilateral 07/14/2017   Procedure: BILATERAL IMPLANTED PULSE GENERATOR CHANGE FOR DEEP BRAIN STIMULATOR;  Surgeon: Erline Levine, MD;  Location: [place];  Service: Neurosurgery;  Laterality: Bilateral;  . SUBTHALAMIC STIMULATOR INSERTION Bilateral 12/06/2013   Procedure: SUBTHALAMIC STIMULATOR INSERTION;  Surgeon: Erline Levine, MD;  Location: [place] [facility];  Service: Neurosurgery;  Laterality: Bilateral;  Bilateral deep brain stimulator placement    There were no vitals filed for this visit.   Subjective Assessment - 01/06/20 1514    Subjective Patient went to the ER for headaches and dizziness, went home after prolonged wait and clearance of acute abnormality. Physician is looking at potential seizure disorder at this time.    Patient is accompained by: Family member    Pertinent History [name] is  a 69 y.o. male with a hx of HTN, bradycardia, Parkinson's s/p deep brain stimulator 11/2013 , HLD, depression, possible seizure disorder, arthritis, depression/PTSD, dysrhythmia, GERD, sleep apnea. Patient was seen by this therapist in years past. Patient had a fall around Halloween of 2020 and admitted for ORIF on 02/18/19, was discharged to SNF and had [place] around Christmas. Had home health therapy for a month and a half. After that tried to go to Outpatient Therapy for 3 sessions at [facility] and was not pleased  due to how busy and crowded the location was. Fell last week in bathroom, didn't take walker in, falls about 2x month. Is very weak in RLE. Patient has a new lift chair and U drive walking. Has a caregiver 4 days a week. Has additional order for radiculopathy, lumbar region. PMH of lumbar laminectomy/decompression microdiscectomy (L3-4, 4-5)  In 2014.  Lumbar 4-5 cage +rod (2016).    Limitations Walking;Standing;Sitting;House hold activities;Lifting    How long can you sit comfortably? not limited with a backrest, without a back rest 3 minutes    How long can you stand comfortably? 3-5 minutes with holding on.    Patient Stated Goals pain reduction of R hip. strength of legs, dance with wife. push lawnmower.    Currently in Pain? Yes    Pain Score 6     Pain Location Back    Pain Orientation Lower    Pain Descriptors / Indicators Aching    Pain Type Chronic pain    Pain Onset More than a month ago              Patient was getting dressed and had a fall yesterday. Had a bad day of balance all day. Fell into doorway when he was bent over.  Was able to get up from the ground by himself. Pain 6/10   BP at start of session: 146/57 BP standing: 143/63     Supine: Hamstring stretch  x 60 seconds each leg. PT educates caregiver on proper form and execution of task.    R hip inferior distraction with belt SAD (short arc distraction) for pain management. 30 second hold 6x.    R hip lateral distraction (SAD) with belt 6x 30 seconds   Swiss ball TrA activation pressing into ball with UE's and LEs 10x 3 second holds  Swiss ball TrA activation with UE raise 10x each UE, cues for sequencing for core activation and arm movement.   Seated:  STM lumbar paraspinals bilaterally with pt seated d/t shoulder pain in prone. Implementation of effleurage and ptrissage for muscle tension reduction and pain reduction.    Upright posture forward UE flexion, into abduction and slap hands down onto LE's for  posture 10x  Standing with walker: -weight shift onto RLE with no UE support, cues for body mechanics /sequencing 3x30 second holds, min A for reduction of crouch gait pattern    Pt educated throughout session about proper posture and technique with exercises. Improved exercise technique, movement at target joints, use of target muscles after min to mod verbal, visual, tactile cues                    PT Education - 01/06/20 0952    Education provided Yes    Education Details exercise technique, body mechanics    Person(s) Educated Patient    Methods Explanation;Demonstration;Tactile cues;Verbal cues    Comprehension Verbalized understanding;Returned demonstration;Verbal cues required;Tactile cues required  PT Short Term Goals - 12/09/19 1247      PT SHORT TERM GOAL #1   Title Pt will perform HEP with family's/aide's supervision, for improved balance, transfers, and gait.    Baseline 5/5: HEP given 6/28: HEP compliant 8/23: aide helps with HEP    Time 4    Period Weeks    Status Achieved    Target Date 09/19/19             PT Long Term Goals - 12/09/19 1248      PT LONG TERM GOAL #1   Title Patient (> 63 years old) will complete five times sit to stand test in < 15 seconds without UE support without LOB indicating an increased LE strength and improved balance.    Baseline 5/5: 19 seconds one posterior LOB 6/28: 12.32 no UE support    Time 8    Period Weeks    Status Achieved      PT LONG TERM GOAL #2   Title Patient will increase 10 meter walk test to >1.51ms as to improve gait speed for better community ambulation and to reduce fall risk.    Baseline 4/4: 0.71 m/s with U step walker 6/28: 0.917 m/s with U step rollator 8/23: >1.0 m/s with rollator    Time 8    Period Weeks    Status Achieved      PT LONG TERM GOAL #3   Title Patient will increase Berg Balance score by > 6 points (32/56) to demonstrate decreased fall risk during functional  activities.    Baseline 5/5: 26/56 6/28: 36/56    Time 8    Period Weeks    Status Achieved      PT LONG TERM GOAL #4   Title Patient will increase FOTO score to equal to or greater than 31/100  to demonstrate statistically significant improvement in mobility and quality of life.    Baseline 5/5: 12/100 6/28: 38% 8/23: 11.7    Time 8    Period Weeks    Status On-going    Target Date 02/03/20      PT LONG TERM GOAL #5   Title Patient will increase six minute walk test distance to >1000 for progression to community ambulator and improve gait ability    Baseline 6/28: 750 ft with U step rollator 8/23: 775 w rolling walker, increased crouch pattern of ambulation    Time 8    Period Weeks    Status Partially Met    Target Date 02/03/20      PT LONG TERM GOAL #6   Title Patient will increase Berg Balance score by > 6 points (42/56) to demonstrate decreased fall risk during functional activities.    Baseline 6/28: 36/56 8/23: 42/56    Time 8    Period Weeks    Status Partially Met    Target Date 02/03/20      PT LONG TERM GOAL #7   Title Patient will report a worst pain of 3/10 on VAS in low back to improve tolerance with ADLs and reduced symptoms with activities.    Baseline 8/9: 9/10 8/23: 7/10 pain    Time 8    Period Weeks    Status Partially Met    Target Date 02/03/20      PT LONG TERM GOAL #8   Title Patient will reduce modified Oswestry score to <20 as to demonstrate minimal disability with ADLs including improved sleeping tolerance, walking/sitting tolerance etc for better  mobility with ADLs.    Baseline 8/9: 64% 8/23: 66%    Time 8    Period Weeks    Status On-going    Target Date 02/03/20                 Plan - 01/06/20 1548    Clinical Impression Statement Patient's vitals monitored throughout session due to recent ER visit, potential seizures per physician note, and unknown blood pressure changes. Patient tolerated supine and seated interventions well  however standing is very challenged with increased crouch gait pattern.  Patient would benefit from skilled physical therapy to increase strength, stability, balance, and decrease pain to improve quality of life.    Personal Factors and Comorbidities Age;Comorbidity 3+;Past/Current Experience;Time since onset of injury/illness/exacerbation;Transportation    Comorbidities athritis, bradycardia, cancer, depression, dysrhythmia, GERD, HTN, Parkinson's, PTSD, SOB, sleep apnea    Examination-Activity Limitations Bathing;Bed Mobility;Caring for Others;Bend;Dressing;Lift;Locomotion Level;Reach Overhead;Sit;Squat;Stairs;Stand;Toileting;Transfers    Examination-Participation Restrictions Church;Cleaning;Community Activity;Interpersonal Relationship;Shop;Volunteer;Yard Work;Other    Stability/Clinical Decision Making Stable/Uncomplicated    Rehab Potential Fair    PT Frequency 2x / week    PT Duration 8 weeks    PT Treatment/Interventions ADLs/Self Care Home Management;Aquatic Therapy;Cryotherapy;Electrical Stimulation;Iontophoresis 57m/ml Dexamethasone;Moist Heat;Ultrasound;Contrast Bath;DME Instruction;Gait training;Stair training;Functional mobility training;Therapeutic activities;Therapeutic exercise;Balance training;Neuromuscular re-education;Patient/family education;Manual techniques;Wheelchair mobility training;Energy conservation;Passive range of motion;Traction;Orthotic Fit/Training;Dry needling;Vestibular;Visual/perceptual remediation/compensation;Taping;Canalith Repostioning    PT Next Visit Plan core and pain relief    PT Home Exercise Plan [place] Agree with Plan of Care Patient;Family member/caregiver    Family Member Consulted wife           Patient will benefit from skilled therapeutic intervention in order to improve the following deficits and impairments:  Abnormal gait, Decreased balance, Decreased coordination, Decreased mobility, Impaired tone, Postural dysfunction,  Decreased strength, Decreased safety awareness, Improper body mechanics, Impaired flexibility, Decreased activity tolerance, Decreased endurance, Decreased knowledge of precautions, Difficulty walking, Pain, Cardiopulmonary status limiting activity, Decreased range of motion, Impaired perceived functional ability  Visit Diagnosis: Muscle weakness (generalized)  Other lack of coordination  Parkinson's disease ([facility])  Unsteadiness on feet  Pain in right hip     Problem List Patient Active Problem List   Diagnosis Date Noted  . E. coli bacteremia 02/23/2019  . Dysphagia 02/23/2019  . Aspiration pneumonia ([place] 02/21/2019  . Closed right hip fracture ([place] 02/16/2019  . Rib pain on right side 09/25/2018  . Skin sore 12/01/2017  . Hematoma 10/18/2017  . Fall at home 10/18/2017  . 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 Radicular pain in right arm 10/09/2016  . GERD (gastroesophageal reflux disease) 07/31/2016  . Trochanteric bursitis 03/03/2016  . Left hand pain 10/30/2015  . Fracture, finger, multiple sites 10/30/2015  . Colon cancer screening 07/23/2015  . Healthcare maintenance 07/23/2015  . Gout 02/18/2015  . Depression 01/06/2015  . Irritation of eyelid 08/20/2014  . Dupuytren's contracture 08/20/2014  . Cough 07/21/2014  . Lumbar stenosis with neurogenic claudication 06/13/2014  . Preop cardiovascular exam 05/28/2014  . S/P deep brain stimulator placement 05/08/2014  . Joint pain 01/22/2014  . Medicare annual wellness visit, initial 01/19/2014  . Advance care planning 01/19/2014  . Parkinson's disease ([place] 12/13/2013  . PTSD (post-traumatic stress disorder) 06/13/2013  . Erectile dysfunction 06/13/2013  . HLD (hyperlipidemia) 06/13/2013  . Hip pain 06/10/2013  . Pain in joint, shoulder region 06/10/2013  . Right leg swelling 06/03/2013  . Essential hypertension 06/03/2013  . Bradycardia by electrocardiogram 06/03/2013  . Obstructive sleep  apnea 03/12/2013  .  Spinal stenosis, lumbar region, with neurogenic claudication 12/14/2012   Janna Arch, PT, DPT   01/06/2020, 3:49 PM  [place] MAIN [facility] SERVICES [address] [place], [place], [address] Phone: [phone]   Fax:  [phone]  Name: HANK WALLING MRN: [id number] Date of Birth: 03-11-1951

## 2020-01-08 ENCOUNTER — Ambulatory Visit: Payer: Medicare PPO

## 2020-01-08 ENCOUNTER — Other Ambulatory Visit: Payer: Self-pay

## 2020-01-08 ENCOUNTER — Ambulatory Visit: Payer: Medicare PPO | Admitting: Occupational Therapy

## 2020-01-08 ENCOUNTER — Encounter: Payer: Self-pay | Admitting: Occupational Therapy

## 2020-01-08 DIAGNOSIS — M6281 Muscle weakness (generalized): Secondary | ICD-10-CM

## 2020-01-08 DIAGNOSIS — R2681 Unsteadiness on feet: Secondary | ICD-10-CM

## 2020-01-08 DIAGNOSIS — R278 Other lack of coordination: Secondary | ICD-10-CM | POA: Diagnosis not present

## 2020-01-08 DIAGNOSIS — G20 Parkinson's disease: Secondary | ICD-10-CM | POA: Diagnosis not present

## 2020-01-08 DIAGNOSIS — M25551 Pain in right hip: Secondary | ICD-10-CM | POA: Diagnosis not present

## 2020-01-08 NOTE — Therapy (Signed)
[place] MAIN [facility] SERVICES [address] [place], [place], [address] Phone: [phone]   Fax:  [phone]  Occupational Therapy Treatment  Patient Details  Name: [name] MRN: [id number] Date of Birth: February 13, 1951 No data recorded  Encounter Date: 01/08/2020   OT End of Session - 01/08/20 1104    Visit Number 6    Number of Visits 24    Date for OT Re-Evaluation 03/06/20    Authorization Type reporting period starting 12/11/2019    OT Start Time 1100    OT Stop Time 1145    OT Time Calculation (min) 45 min    Activity Tolerance Patient tolerated treatment well    Behavior During Therapy [facility] for tasks assessed/performed           Past Medical History:  Diagnosis Date  . Arthritis   . Bradycardia   . Cancer [facility]) 2013   skin cancer  . Depression    ptsd  . Dysrhythmia    chronic slow heart rate  . GERD (gastroesophageal reflux disease)   . Headache(784.0)    tension headaches non recent  . History of chicken pox   . History of kidney stones    passed  . Hypertension    treated with HCTZ  . Parkinson's disease ([place])    dx'ed 15 years ago  . PTSD (post-traumatic stress disorder)   . Shortness of breath dyspnea   . Sleep apnea    doesn't use C-pap  . Varicose veins     Past Surgical History:  Procedure Laterality Date  . CHOLECYSTECTOMY N/A 10/22/2014   Procedure: LAPAROSCOPIC CHOLECYSTECTOMY WITH INTRAOPERATIVE CHOLANGIOGRAM;  Surgeon: Dia Crawford III, MD;  Location: [facility] ORS;  Service: General;  Laterality: N/A;  . cyst removed      from lip as a child  . INTRAMEDULLARY (IM) NAIL INTERTROCHANTERIC N/A 02/18/2019   Procedure: INTRAMEDULLARY (IM) NAIL INTERTROCHANTRIC, RIGHT,;  Surgeon: Thornton Park, MD;  Location: [facility] ORS;  Service: Orthopedics;  Laterality: N/A;  . LUMBAR LAMINECTOMY/DECOMPRESSION MICRODISCECTOMY Bilateral 12/14/2012   Procedure: Bilateral lumbar three-four, four-five decompressive  laminotomy/foraminotomy;  Surgeon: Charlie Pitter, MD;  Location: [place] NEURO ORS;  Service: Neurosurgery;  Laterality: Bilateral;  . PULSE GENERATOR IMPLANT Bilateral 12/13/2013   Procedure: Bilateral implantable pulse generator placement;  Surgeon: Erline Levine, MD;  Location: [place] NEURO ORS;  Service: Neurosurgery;  Laterality: Bilateral;  Bilateral implantable pulse generator placement  . skin cancer removed     from ears,   12 lft arm  rt leg 15  . SUBTHALAMIC STIMULATOR BATTERY REPLACEMENT Bilateral 07/14/2017   Procedure: BILATERAL IMPLANTED PULSE GENERATOR CHANGE FOR DEEP BRAIN STIMULATOR;  Surgeon: Erline Levine, MD;  Location: [place];  Service: Neurosurgery;  Laterality: Bilateral;  . SUBTHALAMIC STIMULATOR INSERTION Bilateral 12/06/2013   Procedure: SUBTHALAMIC STIMULATOR INSERTION;  Surgeon: Erline Levine, MD;  Location: [place] NEURO ORS;  Service: Neurosurgery;  Laterality: Bilateral;  Bilateral deep brain stimulator placement    There were no vitals filed for this visit.   Subjective Assessment - 01/08/20 1202    Subjective  Pt. reports have a good weekend.    Patient is accompanied by: Family member    Pertinent History Pt. is a 69 yo male who has a history of Parkinson's Disease. Pt. has a Deep Brain Stimulator in place. Pt. has a history of Low Back Pain with 2 back surgeries in August 2014, and February 2016. Pt. has a history of multiple frequent falls, unsure of  how many falls in the last 6 months.    Patient Stated Goals Pt would like to be more independent daily.    Currently in Pain? Yes    Pain Score 4     Pain Location Back    Pain Orientation Lower    Pain Descriptors / Indicators Aching    Pain Type Chronic pain          OT Treatment   Therapeutic Exercise:  Pt. performed 2# dowel ex. For UE strengthening secondary to weakness. Bilateral shoulder flexion, chest press, circular patterns, and elbow flexion/extension were performed.3# dumbbell ex. for forearm  supination/pronation, wrist flexion/extension.Pt. requires rest breaks and verbal cues, tactile cues, and cues for visual demonstrationfor proper technique. Pt. required increased time to complete  Pt. reports that the patient is having a procedure for his back on Friday, followed by an EEG. Edma in the bilateral hands was improved today. Pt. reports 4/10 back pain. Pt.'s BP 159/75 HR 44 bpms. Pt present with delayed task initiation. Pt. required increased time verbal, and visual cues for proper there. Ex. form, and pace during the exercises. Pt. continues to work on improving UE strength, [facility] skill in order to work towards improving, and maximizing independence with ADLs, and IADLs.                              OT Education - 01/08/20 1203    Education provided Yes    Education Details goals, POC    Person(s) Educated Patient;Spouse    Methods Explanation    Comprehension Verbalized understanding               OT Long Term Goals - 12/13/19 2046      OT LONG TERM GOAL #1   Title Pt. will improve Bilateral UE strength by 1 mm grade to assist with ADLs, and IADLs    Baseline BUE strength 4/5 overall at eval    Time 12    Period Weeks    Status New    Target Date 03/06/20      OT LONG TERM GOAL #2   Title Pt. will improve grip strength by 10# to open jars and containers.    Baseline difficulty at eval    Time 12    Period Weeks    Status New    Target Date 03/06/20      OT LONG TERM GOAL #3   Title Patient will complete lower body dressing with modified independence.    Baseline min to mod assist at eval    Time 12    Period Weeks    Status New    Target Date 03/06/20      OT LONG TERM GOAL #4   Title Patient to demonstrate the ability to vacuum flooring at home with use of modified techniques with supervision.    Baseline difficulty at eval    Time 12    Period Weeks    Status New    Target Date 03/06/20      OT LONG TERM GOAL #5    Title Patient will demonstrate the ability to retrieve a snack and transport it to the living room with modified independence.    Baseline unable at eval    Time 12    Period Weeks    Status New    Target Date 03/06/20      OT LONG TERM GOAL #6   Title Patient  will improve hand function to be able to perform cutting of food with modified independence.    Baseline wife helps to cut food when needed.    Time 8    Period Weeks    Status New    Target Date 02/05/20      OT LONG TERM GOAL #7   Title Pt. will write a paragraph efficiently with 90% legibility.    Baseline decreased legibility and poor hand endurance for writing more than 1 sentence.    Time 8    Period Weeks    Status New    Target Date 02/05/20      OT LONG TERM GOAL  #10   TITLE Pt will don and doff pants with supervision    Baseline min assist to manage pants    Time 12    Period Weeks    Status New    Target Date 03/06/20                 Plan - 01/08/20 1105    Clinical Impression Statement Pt. reports that the patient is having a procedure for his back on Friday, followed by an EEG. Edma in the bilateral hands was improved today. Pt. reports 4/10 back pain. Pt.'s BP 159/75 HR 44 bpms. Pt present with delayed task initiation. Pt. required increased time verbal, and visual cues for proper there. Ex. form, and pace during the exercises. Pt. continues to work on improving UE strength, [facility] skill in order to work towards improving, and maximizing independence with ADLs, and IADLs.   OT Occupational Profile and History Detailed Assessment- Review of Records and additional review of physical, cognitive, psychosocial history related to current functional performance    Occupational performance deficits (Please refer to evaluation for details): ADL's;IADL's;Leisure    Body Structure / Function / Physical Skills ADL;Coordination;Endurance;GMC;UE functional use;Balance;Decreased knowledge of use of  DME;IADL;Pain;Dexterity;FMC;Strength;Mobility;ROM    Cognitive Skills Attention;Problem Solve;Memory    Psychosocial Skills Coping Strategies;Environmental  Adaptations;Habits;Routines and Behaviors    Rehab Potential Good    Clinical Decision Making Limited treatment options, no task modification necessary    Comorbidities Affecting Occupational Performance: Presence of comorbidities impacting occupational performance    Comorbidities impacting occupational performance description: risk for falls, slow to respond and process information at times, safety    Modification or Assistance to Complete Evaluation  No modification of tasks or assist necessary to complete eval    OT Frequency 2x / week    OT Duration 12 weeks    OT Treatment/Interventions Self-care/ADL training;Therapeutic exercise;DME and/or AE instruction;Energy conservation;Neuromuscular education;Patient/family education;Therapeutic activities;Moist Heat;Functional Mobility Training;Cognitive remediation/compensation    Consulted and Agree with Plan of Care Patient           Patient will benefit from skilled therapeutic intervention in order to improve the following deficits and impairments:   Body Structure / Function / Physical Skills: ADL, Coordination, Endurance, GMC, UE functional use, Balance, Decreased knowledge of use of DME, IADL, Pain, Dexterity, FMC, Strength, Mobility, ROM Cognitive Skills: Attention, Problem Solve, Memory Psychosocial Skills: Coping Strategies, Environmental  Adaptations, Habits, Routines and Behaviors   Visit Diagnosis: Muscle weakness (generalized)    Problem List Patient Active Problem List   Diagnosis Date Noted  . E. coli bacteremia 02/23/2019  . Dysphagia 02/23/2019  . Aspiration pneumonia ([place]) 02/21/2019  . Closed right hip fracture ([place]) 02/16/2019  . Rib pain on right side 09/25/2018  . Skin sore 12/01/2017  . Hematoma 10/18/2017  . Fall at home  10/18/2017  . REM behavioral  disorder 11/02/2016  . Radicular pain in right arm 10/09/2016  . GERD (gastroesophageal reflux disease) 07/31/2016  . Trochanteric bursitis 03/03/2016  . Left hand pain 10/30/2015  . Fracture, finger, multiple sites 10/30/2015  . Colon cancer screening 07/23/2015  . Healthcare maintenance 07/23/2015  . Gout 02/18/2015  . Depression 01/06/2015  . Irritation of eyelid 08/20/2014  . Dupuytren's contracture 08/20/2014  . Cough 07/21/2014  . Lumbar stenosis with neurogenic claudication 06/13/2014  . Preop cardiovascular exam 05/28/2014  . S/P deep brain stimulator placement 05/08/2014  . Joint pain 01/22/2014  . Medicare annual wellness visit, initial 01/19/2014  . Advance care planning 01/19/2014  . Parkinson's disease ([place]) 12/13/2013  . PTSD (post-traumatic stress disorder) 06/13/2013  . Erectile dysfunction 06/13/2013  . HLD (hyperlipidemia) 06/13/2013  . Hip pain 06/10/2013  . Pain in joint, shoulder region 06/10/2013  . Right leg swelling 06/03/2013  . Essential hypertension 06/03/2013  . Bradycardia by electrocardiogram 06/03/2013  . Obstructive sleep apnea 03/12/2013  . Spinal stenosis, lumbar region, with neurogenic claudication 12/14/2012    Harrel Carina, MS, OTR/L 01/08/2020, 12:04 PM  [place] MAIN [facility] SERVICES [address] [place], [place], [address] Phone: [phone]   Fax:  [phone]  Name: [name] MRN: [id number] Date of Birth: [birth year]

## 2020-01-08 NOTE — Therapy (Signed)
[facility] MAIN [facility] SERVICES [address] [place], [place], [address] Phone: [phone]   Fax:  [phone]  Physical Therapy Treatment Physical Therapy Progress Note   Dates of reporting period  11/25/19   to   01/08/20  Patient Details  Name: [name] MRN: [id number] Date of Birth: 1950-07-07 Referring Provider (PT): Si Gaul MD    Encounter Date: 01/08/2020   PT End of Session - 01/08/20 1011    Visit Number 30    Number of Visits 40    Date for PT Re-Evaluation 02/03/20    Authorization Type 10/10 PN 8/9; next session 1/10 PN 9/22    Authorization Time Period FOTO- PT    PT Start Time 1015    PT Stop Time 1059    PT Time Calculation (min) 44 min    Equipment Utilized During Treatment Gait belt    Activity Tolerance Patient tolerated treatment well;No increased pain    Behavior During Therapy WFL for tasks assessed/performed           Past Medical History:  Diagnosis Date  . Arthritis   . Bradycardia   . Cancer [facility]) 2013   skin cancer  . Depression    ptsd  . Dysrhythmia    chronic slow heart rate  . GERD (gastroesophageal reflux disease)   . Headache(784.0)    tension headaches non recent  . History of chicken pox   . History of kidney stones    passed  . Hypertension    treated with HCTZ  . Parkinson's disease (HCC)    dx'ed 15 years ago  . PTSD (post-traumatic stress disorder)   . Shortness of breath dyspnea   . Sleep apnea    doesn't use C-pap  . Varicose veins     Past Surgical History:  Procedure Laterality Date  . CHOLECYSTECTOMY N/A 10/22/2014   Procedure: LAPAROSCOPIC CHOLECYSTECTOMY WITH INTRAOPERATIVE CHOLANGIOGRAM;  Surgeon: Tiney Rouge III, MD;  Location: [facility] ORS;  Service: General;  Laterality: N/A;  . cyst removed      from lip as a child  . INTRAMEDULLARY (IM) NAIL INTERTROCHANTERIC N/A 02/18/2019   Procedure: INTRAMEDULLARY (IM) NAIL INTERTROCHANTRIC, RIGHT,;  Surgeon: Juanell Fairly, MD;  Location: [facility] ORS;  Service: Orthopedics;  Laterality: N/A;  . LUMBAR LAMINECTOMY/DECOMPRESSION MICRODISCECTOMY Bilateral 12/14/2012   Procedure: Bilateral lumbar three-four, four-five decompressive laminotomy/foraminotomy;  Surgeon: Temple Pacini, MD;  Location: [facility];  Service: Neurosurgery;  Laterality: Bilateral;  . PULSE GENERATOR IMPLANT Bilateral 12/13/2013   Procedure: Bilateral implantable pulse generator placement;  Surgeon: Maeola Harman, MD;  Location: [facility];  Service: Neurosurgery;  Laterality: Bilateral;  Bilateral implantable pulse generator placement  . skin cancer removed     from ears,   12 lft arm  rt leg 15  . SUBTHALAMIC STIMULATOR BATTERY REPLACEMENT Bilateral 07/14/2017   Procedure: BILATERAL IMPLANTED PULSE GENERATOR CHANGE FOR DEEP BRAIN STIMULATOR;  Surgeon: Maeola Harman, MD;  Location: [facility] OR;  Service: Neurosurgery;  Laterality: Bilateral;  . SUBTHALAMIC STIMULATOR INSERTION Bilateral 12/06/2013   Procedure: SUBTHALAMIC STIMULATOR INSERTION;  Surgeon: Maeola Harman, MD;  Location: [facility];  Service: Neurosurgery;  Laterality: Bilateral;  Bilateral deep brain stimulator placement    There were no vitals filed for this visit.   Subjective Assessment - 01/08/20 1018    Subjective Patient reports his pain in his back continues to bother him but is a little better than monday. No falls or LOB since last  session. Presents with aide not wife this session    Patient is accompained by: Family member    Pertinent History [name] is a 69 y.o. male with a hx of HTN, bradycardia, Parkinson's s/p deep brain stimulator 11/2013 , HLD, depression, possible seizure disorder, arthritis, depression/PTSD, dysrhythmia, GERD, sleep apnea. Patient was seen by this therapist in years past. Patient had a fall around Halloween of 2020 and admitted for ORIF on 02/18/19, was discharged to SNF and had [facility] around Christmas. Had home health therapy for a month and a  half. After that tried to go to Outpatient Therapy for 3 sessions at [facility] and was not pleased due to how busy and crowded the location was. Fell last week in bathroom, didn't take walker in, falls about 2x month. Is very weak in RLE. Patient has a new lift chair and U drive walking. Has a caregiver 4 days a week. Has additional order for radiculopathy, lumbar region. PMH of lumbar laminectomy/decompression microdiscectomy (L3-4, 4-5)  In 2014.  Lumbar 4-5 cage +rod (2016).    Limitations Walking;Standing;Sitting;House hold activities;Lifting    How long can you sit comfortably? not limited with a backrest, without a back rest 3 minutes    How long can you stand comfortably? 3-5 minutes with holding on.    Patient Stated Goals pain reduction of R hip. strength of legs, dance with wife. push lawnmower.    Currently in Pain? Yes    Pain Score 4     Pain Location Back    Pain Orientation Lower    Pain Descriptors / Indicators Aching    Pain Type Chronic pain    Pain Onset More than a month ago    Pain Frequency Intermittent              [facility] PT Assessment - 01/08/20 0001      Berg Balance Test   Sit to Stand Able to stand without using hands and stabilize independently    Standing Unsupported Able to stand 2 minutes with supervision    Sitting with Back Unsupported but Feet Supported on Floor or Stool Able to sit safely and securely 2 minutes    Stand to Sit Sits safely with minimal use of hands    Transfers Able to transfer safely, minor use of hands    Standing Unsupported with Eyes Closed Able to stand 10 seconds with supervision    Standing Unsupported with Feet Together Able to place feet together independently and stand for 1 minute with supervision    From Standing, Reach Forward with Outstretched Arm Can reach confidently >25 cm (10")    From Standing Position, Pick up Object from Floor Able to pick up shoe, needs supervision    From Standing Position, Turn to Look Behind  Over each Shoulder Looks behind one side only/other side shows less weight shift    Turn 360 Degrees Able to turn 360 degrees safely but slowly    Standing Unsupported, Alternately Place Feet on Step/Stool Able to complete 4 steps without aid or supervision    Standing Unsupported, One Foot in Front Able to plae foot ahead of the other independently and hold 30 seconds    Standing on One Leg Able to lift leg independently and hold equal to or more than 3 seconds    Total Score 44             Progress note:   Goals:   6 minute walk test: deferred due  to pain resulting in knee buckling BERG: 44/56 VAS 7/10  MODI: 44%  FOTO-fill out with caregiver: 45%   Treatment:    Supine: Hamstring stretch  x 60 seconds each leg. PT educates caregiver on proper form and execution of task.  R hip inferior distractionwith belt SAD (short arc distraction)for pain management. 30 second hold 6x.  R hip lateral distraction (SAD) with belt 6x 30 seconds   Hooklying TA bracing with green physioball to promote core stabilization. PT cues for breathing and correct form. 10x with 3 second holds.   GTB hip abduction 15x cues for increased muscle activation of RLE  Hip isometric abduction into PT hand 15x 6 second hold  Seated:  STM lumbar paraspinals bilaterally with pt seated d/t shoulder pain in prone.Implementation of effleurage and ptrissage for muscle tension reduction and pain reduction.  Gentle twist with elbows held rotating L and R 10 second overpressure each side   Patient's condition has the potential to improve in response to therapy. Maximum improvement is yet to be obtained. The anticipated improvement is attainable and reasonable in a generally predictable time.  Patient reports his back pain limits his walking and moving. Reports its better but still limits his life.   Pt educated throughout session about proper posture and technique with exercises. Improved exercise  technique, movement at target joints, use of target muscles after min to mod verbal, visual, tactile cues               PT Education - 01/08/20 1011    Education provided Yes    Education Details goals, POC, body mechanics    Person(s) Educated Patient    Methods Explanation;Demonstration;Tactile cues;Verbal cues    Comprehension Verbalized understanding;Returned demonstration;Verbal cues required;Tactile cues required            PT Short Term Goals - 01/08/20 1024      PT SHORT TERM GOAL #1   Title Pt will perform HEP with family's/aide's supervision, for improved balance, transfers, and gait.    Baseline 5/5: HEP given 6/28: HEP compliant 8/23: aide helps with HEP    Time 4    Period Weeks    Status Achieved    Target Date 09/19/19             PT Long Term Goals - 01/08/20 1021      PT LONG TERM GOAL #1   Title Patient (> 75 years old) will complete five times sit to stand test in < 15 seconds without UE support without LOB indicating an increased LE strength and improved balance.    Baseline 5/5: 19 seconds one posterior LOB 6/28: 12.32 no UE support    Time 8    Period Weeks    Status Achieved      PT LONG TERM GOAL #2   Title Patient will increase 10 meter walk test to >1.14m/s as to improve gait speed for better community ambulation and to reduce fall risk.    Baseline 4/4: 0.71 m/s with U step walker 6/28: 0.917 m/s with U step rollator 8/23: >1.0 m/s with rollator    Time 8    Period Weeks    Status Achieved      PT LONG TERM GOAL #3   Title Patient will increase Berg Balance score by > 6 points (32/56) to demonstrate decreased fall risk during functional activities.    Baseline 5/5: 26/56 6/28: 36/56    Time 8    Period Weeks  Status Achieved      PT LONG TERM GOAL #4   Title Patient will increase FOTO score to equal to or greater than 31/100  to demonstrate statistically significant improvement in mobility and quality of life.    Baseline  5/5: 12/100 6/28: 38% 8/23: 11.7 9/22: 45%    Time 8    Period Weeks    Status Achieved      PT LONG TERM GOAL #5   Title Patient will increase six minute walk test distance to >1000 for progression to community ambulator and improve gait ability    Baseline 6/28: 750 ft with U step rollator 8/23: 775 w rolling walker, increased crouch pattern of ambulation 9/22: deferred due to pain    Time 8    Period Weeks    Status Partially Met    Target Date 02/03/20      PT LONG TERM GOAL #6   Title Patient will increase Berg Balance score by > 6 points (42/56) to demonstrate decreased fall risk during functional activities.    Baseline 6/28: 36/56 8/23: 42/56 9/22 44/56    Time 8    Period Weeks    Status Partially Met    Target Date 02/03/20      PT LONG TERM GOAL #7   Title Patient will report a worst pain of 3/10 on VAS in low back to improve tolerance with ADLs and reduced symptoms with activities.    Baseline 8/9: 9/10 8/23: 7/10 pain 9/22: 7/10    Time 8    Period Weeks    Status Partially Met    Target Date 02/03/20      PT LONG TERM GOAL #8   Title Patient will reduce modified Oswestry score to <20 as to demonstrate minimal disability with ADLs including improved sleeping tolerance, walking/sitting tolerance etc for better mobility with ADLs.    Baseline 8/9: 64% 8/23: 66% 9/22:44%    Time 8    Period Weeks    Status Partially Met    Target Date 02/03/20                 Plan - 01/08/20 1202    Clinical Impression Statement Patient has met his FOTO goal however was unable to perform his 6 minute walk test due to pain and fatigue this session deferring it to next session. High levels of pain consist at 7/10 but is occurring less frequently. BERG and MODI additionally have improved.  Patient's condition has the potential to improve in response to therapy. Maximum improvement is yet to be obtained. The anticipated improvement is attainable and reasonable in a generally  predictable time. Patient would benefit from skilled physical therapy to increase strength, stability, balance, and decrease pain to improve quality of life.    Personal Factors and Comorbidities Age;Comorbidity 3+;Past/Current Experience;Time since onset of injury/illness/exacerbation;Transportation    Comorbidities athritis, bradycardia, cancer, depression, dysrhythmia, GERD, HTN, Parkinson's, PTSD, SOB, sleep apnea    Examination-Activity Limitations Bathing;Bed Mobility;Caring for Others;Bend;Dressing;Lift;Locomotion Level;Reach Overhead;Sit;Squat;Stairs;Stand;Toileting;Transfers    Examination-Participation Restrictions Church;Cleaning;Community Activity;Interpersonal Relationship;Shop;Volunteer;Yard Work;Other    Stability/Clinical Decision Making Stable/Uncomplicated    Rehab Potential Fair    PT Frequency 2x / week    PT Duration 8 weeks    PT Treatment/Interventions ADLs/Self Care Home Management;Aquatic Therapy;Cryotherapy;Electrical Stimulation;Iontophoresis 4mg /ml Dexamethasone;Moist Heat;Ultrasound;Contrast Bath;DME Instruction;Gait training;Stair training;Functional mobility training;Therapeutic activities;Therapeutic exercise;Balance training;Neuromuscular re-education;Patient/family education;Manual techniques;Wheelchair mobility training;Energy conservation;Passive range of motion;Traction;Orthotic Fit/Training;Dry needling;Vestibular;Visual/perceptual remediation/compensation;Taping;Canalith Repostioning    PT Next Visit Plan core and pain relief  PT Home Exercise Plan [email or web address]    Consulted and Agree with Plan of Care Patient;Family member/caregiver    Family Member Consulted wife           Patient will benefit from skilled therapeutic intervention in order to improve the following deficits and impairments:  Abnormal gait, Decreased balance, Decreased coordination, Decreased mobility, Impaired tone, Postural dysfunction, Decreased strength, Decreased safety awareness, Improper  body mechanics, Impaired flexibility, Decreased activity tolerance, Decreased endurance, Decreased knowledge of precautions, Difficulty walking, Pain, Cardiopulmonary status limiting activity, Decreased range of motion, Impaired perceived functional ability  Visit Diagnosis: Muscle weakness (generalized)  Other lack of coordination  Parkinson's disease (HCC)  Unsteadiness on feet  Pain in right hip     Problem List Patient Active Problem List   Diagnosis Date Noted  . E. coli bacteremia 02/23/2019  . Dysphagia 02/23/2019  . Aspiration pneumonia ([place]) 02/21/2019  . Closed right hip fracture ([place]) 02/16/2019  . Rib pain on right side 09/25/2018  . Skin sore 12/01/2017  . Hematoma 10/18/2017  . Fall at home 10/18/2017  . 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 Radicular pain in right arm 10/09/2016  . GERD (gastroesophageal reflux disease) 07/31/2016  . Trochanteric bursitis 03/03/2016  . Left hand pain 10/30/2015  . Fracture, finger, multiple sites 10/30/2015  . Colon cancer screening 07/23/2015  . Healthcare maintenance 07/23/2015  . Gout 02/18/2015  . Depression 01/06/2015  . Irritation of eyelid 08/20/2014  . Dupuytren's contracture 08/20/2014  . Cough 07/21/2014  . Lumbar stenosis with neurogenic claudication 06/13/2014  . Preop cardiovascular exam 05/28/2014  . S/P deep brain stimulator placement 05/08/2014  . Joint pain 01/22/2014  . Medicare annual wellness visit, initial 01/19/2014  . Advance care planning 01/19/2014  . Parkinson's disease ([place]) 12/13/2013  . PTSD (post-traumatic stress disorder) 06/13/2013  . Erectile dysfunction 06/13/2013  . HLD (hyperlipidemia) 06/13/2013  . Hip pain 06/10/2013  . Pain in joint, shoulder region 06/10/2013  . Right leg swelling 06/03/2013  . Essential hypertension 06/03/2013  . Bradycardia by electrocardiogram 06/03/2013  . Obstructive sleep apnea 03/12/2013  . Spinal stenosis, lumbar region, with neurogenic claudication  12/14/2012   Janna Arch, PT, DPT   01/08/2020, 12:05 PM  [place] MAIN [facility] SERVICES [address] [place], [place], [address] Phone: [phone]   Fax:  [phone]  Name: [name] MRN: [id number] Date of Birth: [birth year]

## 2020-01-10 DIAGNOSIS — R4689 Other symptoms and signs involving appearance and behavior: Secondary | ICD-10-CM | POA: Diagnosis not present

## 2020-01-10 DIAGNOSIS — G20 Parkinson's disease: Secondary | ICD-10-CM | POA: Diagnosis not present

## 2020-01-10 DIAGNOSIS — R4182 Altered mental status, unspecified: Secondary | ICD-10-CM | POA: Diagnosis not present

## 2020-01-10 DIAGNOSIS — M47817 Spondylosis without myelopathy or radiculopathy, lumbosacral region: Secondary | ICD-10-CM | POA: Diagnosis not present

## 2020-01-11 DIAGNOSIS — G20 Parkinson's disease: Secondary | ICD-10-CM | POA: Diagnosis not present

## 2020-01-11 DIAGNOSIS — R4689 Other symptoms and signs involving appearance and behavior: Secondary | ICD-10-CM | POA: Diagnosis not present

## 2020-01-11 DIAGNOSIS — R4182 Altered mental status, unspecified: Secondary | ICD-10-CM | POA: Diagnosis not present

## 2020-01-12 DIAGNOSIS — R4182 Altered mental status, unspecified: Secondary | ICD-10-CM | POA: Diagnosis not present

## 2020-01-13 ENCOUNTER — Ambulatory Visit: Payer: Medicare PPO

## 2020-01-13 ENCOUNTER — Ambulatory Visit: Payer: Medicare PPO | Admitting: Occupational Therapy

## 2020-01-15 ENCOUNTER — Other Ambulatory Visit: Payer: Self-pay

## 2020-01-15 ENCOUNTER — Encounter: Payer: Self-pay | Admitting: Occupational Therapy

## 2020-01-15 ENCOUNTER — Ambulatory Visit: Payer: Medicare PPO

## 2020-01-15 ENCOUNTER — Ambulatory Visit: Payer: Medicare PPO | Admitting: Occupational Therapy

## 2020-01-15 DIAGNOSIS — G20 Parkinson's disease: Secondary | ICD-10-CM | POA: Diagnosis not present

## 2020-01-15 DIAGNOSIS — R2681 Unsteadiness on feet: Secondary | ICD-10-CM

## 2020-01-15 DIAGNOSIS — M6281 Muscle weakness (generalized): Secondary | ICD-10-CM

## 2020-01-15 DIAGNOSIS — R278 Other lack of coordination: Secondary | ICD-10-CM

## 2020-01-15 DIAGNOSIS — M25551 Pain in right hip: Secondary | ICD-10-CM | POA: Diagnosis not present

## 2020-01-15 NOTE — Therapy (Signed)
[place] MAIN [facility] SERVICES [address] [place], [place], [address] Phone: [phone]   Fax:  [phone]  Physical Therapy Treatment  Patient Details  Name: [name] MRN: [id number] Date of Birth: 01/12/1951 Referring Provider (PT): Sharolyn Douglas MD    Encounter Date: 01/15/2020   PT End of Session - 01/15/20 1236    Visit Number 31    Number of Visits 40    Date for PT Re-Evaluation 02/03/20    Authorization Type 1/10 PN 9/22    Authorization Time Period FOTO- PT    PT Start Time 1016    PT Stop Time 1059    PT Time Calculation (min) 43 min    Equipment Utilized During Treatment Gait belt    Activity Tolerance Patient tolerated treatment well;No increased pain    Behavior During Therapy WFL for tasks assessed/performed           Past Medical History:  Diagnosis Date  . Arthritis   . Bradycardia   . Cancer [facility]) 2013   skin cancer  . Depression    ptsd  . Dysrhythmia    chronic slow heart rate  . GERD (gastroesophageal reflux disease)   . Headache(784.0)    tension headaches non recent  . History of chicken pox   . History of kidney stones    passed  . Hypertension    treated with HCTZ  . Parkinson's disease ([place])    dx'ed 15 years ago  . PTSD (post-traumatic stress disorder)   . Shortness of breath dyspnea   . Sleep apnea    doesn't use C-pap  . Varicose veins     Past Surgical History:  Procedure Laterality Date  . CHOLECYSTECTOMY N/A 10/22/2014   Procedure: LAPAROSCOPIC CHOLECYSTECTOMY WITH INTRAOPERATIVE CHOLANGIOGRAM;  Surgeon: Dia Crawford III, MD;  Location: [facility] ORS;  Service: General;  Laterality: N/A;  . cyst removed      from lip as a child  . INTRAMEDULLARY (IM) NAIL INTERTROCHANTERIC N/A 02/18/2019   Procedure: INTRAMEDULLARY (IM) NAIL INTERTROCHANTRIC, RIGHT,;  Surgeon: Thornton Park, MD;  Location: [facility] ORS;  Service: Orthopedics;  Laterality: N/A;  . LUMBAR LAMINECTOMY/DECOMPRESSION  MICRODISCECTOMY Bilateral 12/14/2012   Procedure: Bilateral lumbar three-four, four-five decompressive laminotomy/foraminotomy;  Surgeon: Charlie Pitter, MD;  Location: [place] [facility]URO ORS;  Service: Neurosurgery;  Laterality: Bilateral;  . PULSE GENERATOR IMPLANT Bilateral 12/13/2013   Procedure: Bilateral implantable pulse generator placement;  Surgeon: Erline Levine, MD;  Location: [place] [facility]UR[facility] ORS;  Service: Neurosurgery;  Laterality: Bilateral;  Bilateral implantable pulse generator placement  . skin cancer removed     from ears,   12 lft arm  rt leg 15  . SUBTHALAMIC STIMULATOR BATTERY REPLACEMENT Bilateral 07/14/2017   Procedure: BILATERAL IMPLANTED PULSE GENERATOR CHANGE FOR DEEP BRAIN STIMULATOR;  Surgeon: Erline Levine, MD;  Location: [place];  Service: Neurosurgery;  Laterality: Bilateral;  . SUBTHALAMIC STIMULATOR INSERTION Bilateral 12/06/2013   Procedure: SUBTHALAMIC STIMULATOR INSERTION;  Surgeon: Erline Levine, MD;  Location: [place] [facility] ORS;  Service: Neurosurgery;  Laterality: Bilateral;  Bilateral deep brain stimulator placement    There were no vitals filed for this visit.   Subjective Assessment - 01/15/20 1233    Subjective Patient reports his back pain has improved after his injection last week. Reports no falls, missed last session due to EKG appointment.    Patient is accompained by: Family member    Pertinent History [name] is a 69 y.o. male with a hx  of HTN, bradycardia, Parkinson's s/p deep brain stimulator 11/2013 , HLD, depression, possible seizure disorder, arthritis, depression/PTSD, dysrhythmia, GERD, sleep apnea. Patient was seen by this therapist in years past. Patient had a fall around Halloween of 2020 and admitted for ORIF on 02/18/19, was discharged to SNF and had Castine around Christmas. Had home health therapy for a month and a half. After that tried to go to Outpatient Therapy for 3 sessions at [facility] and was not pleased due to how busy and crowded the location was. Fell  last week in bathroom, didn't take walker in, falls about 2x month. Is very weak in RLE. Patient has a new lift chair and U drive walking. Has a caregiver 4 days a week. Has additional order for radiculopathy, lumbar region. PMH of lumbar laminectomy/decompression microdiscectomy (L3-4, 4-5)  In 2014.  Lumbar 4-5 cage +rod (2016).    Limitations Walking;Standing;Sitting;House hold activities;Lifting    How long can you sit comfortably? not limited with a backrest, without a back rest 3 minutes    How long can you stand comfortably? 3-5 minutes with holding on.    Patient Stated Goals pain reduction of R hip. strength of legs, dance with wife. push lawnmower.    Currently in Pain? Yes    Pain Score 1     Pain Location Back    Pain Orientation Lower    Pain Descriptors / Indicators Aching    Pain Type Chronic pain    Pain Onset More than a month ago    Pain Frequency Intermittent                    Standing in // bars: Step over orange hurdle and back 10x each LE  2x4 between feet: forward, backwards ambulation with BUE support, cues for hand placement, widening BOS and equal weight shift x 8 lengths of // bars Speed ladder: one foot each square, cues for keeping feet wide for improved BOS x 8 lengths of // bars  Balloon taps reaching inside/outside BOS, close CGA and cues for L knee quad activation x 4 minutes  Standing on airex pad; reach to the L to grab a ball and throw at target for pertubation's. X 14 balls, cues for L quad activation due to buckling with distraction/fatigue    Supine: Hamstring stretch  x 60 seconds each leg. PT educates caregiver on proper form and execution of task.    Swiss ball TrA activation pressing into ball with UE's and LEs 10x 3 second holds   Swiss ball TrA activation with UE raise 10x each UE, cues for sequencing for core activation and arm movement.   Swiss ball hamstring curl 15x with min A for stabilization on ball and equal muscle  activation  Swiss ball single leg hamstring curl 15x each LE, more challenging RLE      Pt educated throughout session about proper posture and technique with exercises. Improved exercise technique, movement at target joints, use of target muscles after min to mod verbal, visual, tactile cues                      PT Education - 01/15/20 1235    Education Details exercise technique, body mechanics,    Person(s) Educated Patient    Methods Explanation;Demonstration;Tactile cues;Verbal cues    Comprehension Need further instruction;Verbalized understanding;Returned demonstration;Verbal cues required;Tactile cues required            PT Short Term Goals - 01/08/20  1024      PT SHORT TERM GOAL #1   Title Pt will perform HEP with family's/aide's supervision, for improved balance, transfers, and gait.    Baseline 5/5: HEP given 6/28: HEP compliant 8/23: aide helps with HEP    Time 4    Period Weeks    Status Achieved    Target Date 09/19/19             PT Long Term Goals - 01/08/20 1021      PT LONG TERM GOAL #1   Title Patient (> 76 years old) will complete five times sit to stand test in < 15 seconds without UE support without LOB indicating an increased LE strength and improved balance.    Baseline 5/5: 19 seconds one posterior LOB 6/28: 12.32 no UE support    Time 8    Period Weeks    Status Achieved      PT LONG TERM GOAL #2   Title Patient will increase 10 meter walk test to >1.68m/s as to improve gait speed for better community ambulation and to reduce fall risk.    Baseline 4/4: 0.71 m/s with U step walker 6/28: 0.917 m/s with U step rollator 8/23: >1.0 m/s with rollator    Time 8    Period Weeks    Status Achieved      PT LONG TERM GOAL #3   Title Patient will increase Berg Balance score by > 6 points (32/56) to demonstrate decreased fall risk during functional activities.    Baseline 5/5: 26/56 6/28: 36/56    Time 8    Period Weeks    Status  Achieved      PT LONG TERM GOAL #4   Title Patient will increase FOTO score to equal to or greater than 31/100  to demonstrate statistically significant improvement in mobility and quality of life.    Baseline 5/5: 12/100 6/28: 38% 8/23: 11.7 9/22: 45%    Time 8    Period Weeks    Status Achieved      PT LONG TERM GOAL #5   Title Patient will increase six minute walk test distance to >1000 for progression to community ambulator and improve gait ability    Baseline 6/28: 750 ft with U step rollator 8/23: 775 w rolling walker, increased crouch pattern of ambulation 9/22: deferred due to pain    Time 8    Period Weeks    Status Partially Met    Target Date 02/03/20      PT LONG TERM GOAL #6   Title Patient will increase Berg Balance score by > 6 points (42/56) to demonstrate decreased fall risk during functional activities.    Baseline 6/28: 36/56 8/23: 42/56 9/22 44/56    Time 8    Period Weeks    Status Partially Met    Target Date 02/03/20      PT LONG TERM GOAL #7   Title Patient will report a worst pain of 3/10 on VAS in low back to improve tolerance with ADLs and reduced symptoms with activities.    Baseline 8/9: 9/10 8/23: 7/10 pain 9/22: 7/10    Time 8    Period Weeks    Status Partially Met    Target Date 02/03/20      PT LONG TERM GOAL #8   Title Patient will reduce modified Oswestry score to <20 as to demonstrate minimal disability with ADLs including improved sleeping tolerance, walking/sitting tolerance etc for better mobility  with ADLs.    Baseline 8/9: 64% 8/23: 66% 9/22:44%    Time 8    Period Weeks    Status Partially Met    Target Date 02/03/20                 Plan - 01/15/20 1238    Clinical Impression Statement Patient tolerated return to standing strengthening and stability interventions with little to no pain increase. Patient's step length is improved with use of visual cueing and object placement with limited carryover demonstrated this session.  Patient would benefit from skilled physical therapy to increase strength, stability, balance, and decrease pain to improve quality of life.    Personal Factors and Comorbidities Age;Comorbidity 3+;Past/Current Experience;Time since onset of injury/illness/exacerbation;Transportation    Comorbidities athritis, bradycardia, cancer, depression, dysrhythmia, GERD, HTN, Parkinson's, PTSD, SOB, sleep apnea    Examination-Activity Limitations Bathing;Bed Mobility;Caring for Others;Bend;Dressing;Lift;Locomotion Level;Reach Overhead;Sit;Squat;Stairs;Stand;Toileting;Transfers    Examination-Participation Restrictions Church;Cleaning;Community Activity;Interpersonal Relationship;Shop;Volunteer;Yard Work;Other    Stability/Clinical Decision Making Stable/Uncomplicated    Rehab Potential Fair    PT Frequency 2x / week    PT Duration 8 weeks    PT Treatment/Interventions ADLs/Self Care Home Management;Aquatic Therapy;Cryotherapy;Electrical Stimulation;Iontophoresis 4mg /ml Dexamethasone;Moist Heat;Ultrasound;Contrast Bath;DME Instruction;Gait training;Stair training;Functional mobility training;Therapeutic activities;Therapeutic exercise;Balance training;Neuromuscular re-education;Patient/family education;Manual techniques;Wheelchair mobility training;Energy conservation;Passive range of motion;Traction;Orthotic Fit/Training;Dry needling;Vestibular;Visual/perceptual remediation/compensation;Taping;Canalith Repostioning    PT Next Visit Plan core and pain relief    PT Home Exercise Plan [place] and Agree with Plan of Care Patient;Family member/caregiver    Family Member Consulted wife           Patient will benefit from skilled therapeutic intervention in order to improve the following deficits and impairments:  Abnormal gait, Decreased balance, Decreased coordination, Decreased mobility, Impaired tone, Postural dysfunction, Decreased strength, Decreased safety awareness, Improper body mechanics,  Impaired flexibility, Decreased activity tolerance, Decreased endurance, Decreased knowledge of precautions, Difficulty walking, Pain, Cardiopulmonary status limiting activity, Decreased range of motion, Impaired perceived functional ability  Visit Diagnosis: Muscle weakness (generalized)  Other lack of coordination  Parkinson's disease ([facility])  Unsteadiness on feet     Problem List Patient Active Problem List   Diagnosis Date Noted  . E. coli bacteremia 02/23/2019  . Dysphagia 02/23/2019  . Aspiration pneumonia ([place]) 02/21/2019  . Closed right hip fracture ([place]) 02/16/2019  . Rib pain on right side 09/25/2018  . Skin sore 12/01/2017  . Hematoma 10/18/2017  . Fall at home 10/18/2017  . 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 Radicular pain in right arm 10/09/2016  . GERD (gastroesophageal reflux disease) 07/31/2016  . Trochanteric bursitis 03/03/2016  . Left hand pain 10/30/2015  . Fracture, finger, multiple sites 10/30/2015  . Colon cancer screening 07/23/2015  . Healthcare maintenance 07/23/2015  . Gout 02/18/2015  . Depression 01/06/2015  . Irritation of eyelid 08/20/2014  . Dupuytren's contracture 08/20/2014  . Cough 07/21/2014  . Lumbar stenosis with neurogenic claudication 06/13/2014  . Preop cardiovascular exam 05/28/2014  . S/P deep brain stimulator placement 05/08/2014  . Joint pain 01/22/2014  . Medicare annual wellness visit, initial 01/19/2014  . Advance care planning 01/19/2014  . Parkinson's disease ([place]) 12/13/2013  . PTSD (post-traumatic stress disorder) 06/13/2013  . Erectile dysfunction 06/13/2013  . HLD (hyperlipidemia) 06/13/2013  . Hip pain 06/10/2013  . Pain in joint, shoulder region 06/10/2013  . Right leg swelling 06/03/2013  . Essential hypertension 06/03/2013  . Bradycardia by electrocardiogram 06/03/2013  . Obstructive sleep apnea 03/12/2013  . Spinal stenosis, lumbar region,  with neurogenic claudication 12/14/2012   Janna Arch, PT,  DPT   01/15/2020, 12:40 PM  [place] MAIN [facility] SERVICES [address] [place], [place], [address] Phone: [phone]   Fax:  [phone]  Name: LEODAN BOLYARD MRN: [id number] Date of Birth: [birth year]

## 2020-01-15 NOTE — Therapy (Signed)
[place] MAIN [facility] SERVICES [address] [place], [place], [address] Phone: [phone]   Fax:  [phone]  Occupational Therapy Treatment  Patient Details  Name: [name] MRN: [id number] Date of Birth: 1950-11-06 No data recorded  Encounter Date: 01/15/2020   OT End of Session - 01/15/20 1100    Visit Number 7    Number of Visits 24    Date for OT Re-Evaluation 03/06/20    Authorization Type reporting period starting 12/11/2019    OT Start Time 1100    OT Stop Time 1145    OT Time Calculation (min) 45 min    Activity Tolerance Patient tolerated treatment well    Behavior During Therapy [facility] for tasks assessed/performed           Past Medical History:  Diagnosis Date  . Arthritis   . Bradycardia   . Cancer [facility]) 2013   skin cancer  . Depression    ptsd  . Dysrhythmia    chronic slow heart rate  . GERD (gastroesophageal reflux disease)   . Headache(784.0)    tension headaches non recent  . History of chicken pox   . History of kidney stones    passed  . Hypertension    treated with HCTZ  . Parkinson's disease ([place])    dx'ed 15 years ago  . PTSD (post-traumatic stress disorder)   . Shortness of breath dyspnea   . Sleep apnea    doesn't use C-pap  . Varicose veins     Past Surgical History:  Procedure Laterality Date  . CHOLECYSTECTOMY N/A 10/22/2014   Procedure: LAPAROSCOPIC CHOLECYSTECTOMY WITH INTRAOPERATIVE CHOLANGIOGRAM;  Surgeon: Dia Crawford III, MD;  Location: [facility] ORS;  Service: General;  Laterality: N/A;  . cyst removed      from lip as a child  . INTRAMEDULLARY (IM) NAIL INTERTROCHANTERIC N/A 02/18/2019   Procedure: INTRAMEDULLARY (IM) NAIL INTERTROCHANTRIC, RIGHT,;  Surgeon: Thornton Park, MD;  Location: [facility] ORS;  Service: Orthopedics;  Laterality: N/A;  . LUMBAR LAMINECTOMY/DECOMPRESSION MICRODISCECTOMY Bilateral 12/14/2012   Procedure: Bilateral lumbar three-four, four-five decompressive  laminotomy/foraminotomy;  Surgeon: Charlie Pitter, MD;  Location: [place] [facility]O ORS;  Service: Neurosurgery;  Laterality: Bilateral;  . PULSE GENERATOR IMPLANT Bilateral 12/13/2013   Procedure: Bilateral implantable pulse generator placement;  Surgeon: Erline Levine, MD;  Location: [place] [facility] ORS;  Service: Neurosurgery;  Laterality: Bilateral;  Bilateral implantable pulse generator placement  . skin cancer removed     from ears,   12 lft arm  rt leg 15  . SUBTHALAMIC STIMULATOR BATTERY REPLACEMENT Bilateral 07/14/2017   Procedure: BILATERAL IMPLANTED PULSE GENERATOR CHANGE FOR DEEP BRAIN STIMULATOR;  Surgeon: Erline Levine, MD;  Location: [place];  Service: Neurosurgery;  Laterality: Bilateral;  . SUBTHALAMIC STIMULATOR INSERTION Bilateral 12/06/2013   Procedure: SUBTHALAMIC STIMULATOR INSERTION;  Surgeon: Erline Levine, MD;  Location: [place] NEURO ORS;  Service: Neurosurgery;  Laterality: Bilateral;  Bilateral deep brain stimulator placement    There were no vitals filed for this visit.  OT Treatment   Therapeutic Exercise:  Pt. performed 2# dowel ex. For UE strengthening secondary to weakness. Bilateral shoulder flexion, chest press, circular patterns, and elbow flexion/extension were performed 1 set of 10- 20, and increased to 3# for 1 set of 10-20 reps each. Pt. Performed 3# dumbbell ex. for forearm supination/pronation, wrist flexion/extension.Pt. requires rest breaks and verbal cues, tactile cues, and cues for visual demonstrationfor proper technique. Pt. required increased time to complete  Pt. reports  that the pain has improved since having the procedure last week. Pt. With no reports of back pain today. Pt. tolerated increased weight today. Pt present with delayed task initiation. Pt. required increased time verbal, and visual cues for proper there. Ex. form, and pace during the exercises. Pt. continues to work on improving UE strength, [facility] skill in order to work towards improving, and maximizing  independence with ADLs, and IADLs. Plan to review adaptive equipment with the pt. next visit as pt.                              OT Long Term Goals - 12/13/19 2046      OT LONG TERM GOAL #1   Title Pt. will improve Bilateral UE strength by 1 mm grade to assist with ADLs, and IADLs    Baseline BUE strength 4/5 overall at eval    Time 12    Period Weeks    Status New    Target Date 03/06/20      OT LONG TERM GOAL #2   Title Pt. will improve grip strength by 10# to open jars and containers.    Baseline difficulty at eval    Time 12    Period Weeks    Status New    Target Date 03/06/20      OT LONG TERM GOAL #3   Title Patient will complete lower body dressing with modified independence.    Baseline min to mod assist at eval    Time 12    Period Weeks    Status New    Target Date 03/06/20      OT LONG TERM GOAL #4   Title Patient to demonstrate the ability to vacuum flooring at home with use of modified techniques with supervision.    Baseline difficulty at eval    Time 12    Period Weeks    Status New    Target Date 03/06/20      OT LONG TERM GOAL #5   Title Patient will demonstrate the ability to retrieve a snack and transport it to the living room with modified independence.    Baseline unable at eval    Time 12    Period Weeks    Status New    Target Date 03/06/20      OT LONG TERM GOAL #6   Title Patient will improve hand function to be able to perform cutting of food with modified independence.    Baseline wife helps to cut food when needed.    Time 8    Period Weeks    Status New    Target Date 02/05/20      OT LONG TERM GOAL #7   Title Pt. will write a paragraph efficiently with 90% legibility.    Baseline decreased legibility and poor hand endurance for writing more than 1 sentence.    Time 8    Period Weeks    Status New    Target Date 02/05/20      OT LONG TERM GOAL  #10   TITLE Pt will don and doff pants with supervision     Baseline min assist to manage pants    Time 12    Period Weeks    Status New    Target Date 03/06/20                 Plan - 01/15/20 1100  Clinical Impression Statement Pt. reports that the pain has improved since having the procedure last week. Pt. With no reports of back pain today. Pt. tolerated increased weight today. Pt present with delayed task initiation. Pt. required increased time verbal, and visual cues for proper there. Ex. form, and pace during the exercises. Pt. continues to work on improving UE strength, [facility] skill in order to work towards improving, and maximizing independence with ADLs, and IADLs. Plan to review adaptive equipment with the pt. next visit as pt.    OT Occupational Profile and History Detailed Assessment- Review of Records and additional review of physical, cognitive, psychosocial history related to current functional performance    Occupational performance deficits (Please refer to evaluation for details): ADL's;IADL's;Leisure    Body Structure / Function / Physical Skills ADL;Coordination;Endurance;GMC;UE functional use;Balance;Decreased knowledge of use of DME;IADL;Pain;Dexterity;FMC;Strength;Mobility;ROM    Cognitive Skills Attention;Problem Solve;Memory    Psychosocial Skills Coping Strategies;Environmental  Adaptations;Habits;Routines and Behaviors    Rehab Potential Good    Clinical Decision Making Limited treatment options, no task modification necessary    Comorbidities Affecting Occupational Performance: Presence of comorbidities impacting occupational performance    Comorbidities impacting occupational performance description: risk for falls, slow to respond and process information at times, safety    Modification or Assistance to Complete Evaluation  No modification of tasks or assist necessary to complete eval    OT Frequency 2x / week    OT Duration 12 weeks    OT Treatment/Interventions Self-care/ADL training;Therapeutic exercise;DME  and/or AE instruction;Energy conservation;Neuromuscular education;Patient/family education;Therapeutic activities;Moist Heat;Functional Mobility Training;Cognitive remediation/compensation    Consulted and Agree with Plan of Care Patient           Patient will benefit from skilled therapeutic intervention in order to improve the following deficits and impairments:   Body Structure / Function / Physical Skills: ADL, Coordination, Endurance, GMC, UE functional use, Balance, Decreased knowledge of use of DME, IADL, Pain, Dexterity, FMC, Strength, Mobility, ROM Cognitive Skills: Attention, Problem Solve, Memory Psychosocial Skills: Coping Strategies, Environmental  Adaptations, Habits, Routines and Behaviors   Visit Diagnosis: Muscle weakness (generalized)  Other lack of coordination    Problem List Patient Active Problem List   Diagnosis Date Noted  . E. coli bacteremia 02/23/2019  . Dysphagia 02/23/2019  . Aspiration pneumonia ([place]) 02/21/2019  . Closed right hip fracture ([place]) 02/16/2019  . Rib pain on right side 09/25/2018  . Skin sore 12/01/2017  . Hematoma 10/18/2017  . Fall at home 10/18/2017  . 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 Radicular pain in right arm 10/09/2016  . GERD (gastroesophageal reflux disease) 07/31/2016  . Trochanteric bursitis 03/03/2016  . Left hand pain 10/30/2015  . Fracture, finger, multiple sites 10/30/2015  . Colon cancer screening 07/23/2015  . Healthcare maintenance 07/23/2015  . Gout 02/18/2015  . Depression 01/06/2015  . Irritation of eyelid 08/20/2014  . Dupuytren's contracture 08/20/2014  . Cough 07/21/2014  . Lumbar stenosis with neurogenic claudication 06/13/2014  . Preop cardiovascular exam 05/28/2014  . S/P deep brain stimulator placement 05/08/2014  . Joint pain 01/22/2014  . Medicare annual wellness visit, initial 01/19/2014  . Advance care planning 01/19/2014  . Parkinson's disease ([place]) 12/13/2013  . PTSD (post-traumatic  stress disorder) 06/13/2013  . Erectile dysfunction 06/13/2013  . HLD (hyperlipidemia) 06/13/2013  . Hip pain 06/10/2013  . Pain in joint, shoulder region 06/10/2013  . Right leg swelling 06/03/2013  . Essential hypertension 06/03/2013  . Bradycardia by electrocardiogram 06/03/2013  . Obstructive sleep apnea  03/12/2013  . Spinal stenosis, lumbar region, with neurogenic claudication 12/14/2012    Harrel Carina, MS, OTR/L 01/15/2020, 11:50 AM  [place], [place], [address] Phone: [phone]   Fax:  [phone]  Name: [name] MRN: [id number] Date of Birth: 1950/08/22

## 2020-01-20 ENCOUNTER — Other Ambulatory Visit: Payer: Self-pay

## 2020-01-20 ENCOUNTER — Ambulatory Visit: Payer: Medicare PPO | Admitting: Occupational Therapy

## 2020-01-20 ENCOUNTER — Ambulatory Visit: Payer: Medicare PPO | Attending: Neurology

## 2020-01-20 ENCOUNTER — Ambulatory Visit (INDEPENDENT_AMBULATORY_CARE_PROVIDER_SITE_OTHER): Payer: Medicare PPO | Admitting: Psychology

## 2020-01-20 ENCOUNTER — Encounter: Payer: Self-pay | Admitting: Occupational Therapy

## 2020-01-20 DIAGNOSIS — G20 Parkinson's disease: Secondary | ICD-10-CM | POA: Insufficient documentation

## 2020-01-20 DIAGNOSIS — M6281 Muscle weakness (generalized): Secondary | ICD-10-CM

## 2020-01-20 DIAGNOSIS — R29818 Other symptoms and signs involving the nervous system: Secondary | ICD-10-CM | POA: Diagnosis present

## 2020-01-20 DIAGNOSIS — R293 Abnormal posture: Secondary | ICD-10-CM | POA: Insufficient documentation

## 2020-01-20 DIAGNOSIS — M545 Low back pain, unspecified: Secondary | ICD-10-CM | POA: Diagnosis not present

## 2020-01-20 DIAGNOSIS — M25551 Pain in right hip: Secondary | ICD-10-CM | POA: Diagnosis not present

## 2020-01-20 DIAGNOSIS — R296 Repeated falls: Secondary | ICD-10-CM | POA: Insufficient documentation

## 2020-01-20 DIAGNOSIS — F4323 Adjustment disorder with mixed anxiety and depressed mood: Secondary | ICD-10-CM

## 2020-01-20 DIAGNOSIS — R262 Difficulty in walking, not elsewhere classified: Secondary | ICD-10-CM | POA: Insufficient documentation

## 2020-01-20 DIAGNOSIS — F331 Major depressive disorder, recurrent, moderate: Secondary | ICD-10-CM | POA: Diagnosis not present

## 2020-01-20 DIAGNOSIS — R278 Other lack of coordination: Secondary | ICD-10-CM

## 2020-01-20 DIAGNOSIS — R2689 Other abnormalities of gait and mobility: Secondary | ICD-10-CM | POA: Diagnosis not present

## 2020-01-20 DIAGNOSIS — G8929 Other chronic pain: Secondary | ICD-10-CM | POA: Diagnosis not present

## 2020-01-20 DIAGNOSIS — R2681 Unsteadiness on feet: Secondary | ICD-10-CM

## 2020-01-20 NOTE — Therapy (Signed)
[place] MAIN [facility] SERVICES [address] [place], [place], [address] Phone: [phone]   Fax:  [phone]  Physical Therapy Treatment  Patient Details  Name: [name] MRN: [id number] Date of Birth: [birth year] Referring Provider (PT): Sharolyn Douglas MD    Encounter Date: 01/20/2020   PT End of Session - 01/20/20 1927    Visit Number 32    Number of Visits 40    Date for PT Re-Evaluation 02/03/20    Authorization Type 2/10 PN 9/22    Authorization Time Period FOTO- PT    PT Start Time 0915    PT Stop Time 0959    PT Time Calculation (min) 44 min    Equipment Utilized During Treatment Gait belt    Activity Tolerance Patient tolerated treatment well;Patient limited by pain    Behavior During Therapy Scnetx for tasks assessed/performed           Past Medical History:  Diagnosis Date  . Arthritis   . Bradycardia   . Cancer [facility] 2013   skin cancer  . Depression    ptsd  . Dysrhythmia    chronic slow heart rate  . GERD (gastroesophageal reflux disease)   . Headache(784.0)    tension headaches non recent  . History of chicken pox   . History of kidney stones    passed  . Hypertension    treated with HCTZ  . Parkinson's disease ([place])    dx'ed 15 years ago  . PTSD (post-traumatic stress disorder)   . Shortness of breath dyspnea   . Sleep apnea    doesn't use C-pap  . Varicose veins     Past Surgical History:  Procedure Laterality Date  . CHOLECYSTECTOMY N/A 10/22/2014   Procedure: LAPAROSCOPIC CHOLECYSTECTOMY WITH INTRAOPERATIVE CHOLANGIOGRAM;  Surgeon: Dia Crawford III, MD;  Location: [facility] ORS;  Service: General;  Laterality: N/A;  . cyst removed      from lip as a child  . INTRAMEDULLARY (IM) NAIL INTERTROCHANTERIC N/A 02/18/2019   Procedure: INTRAMEDULLARY (IM) NAIL INTERTROCHANTRIC, RIGHT,;  Surgeon: Thornton Park, MD;  Location: [facility] ORS;  Service: Orthopedics;  Laterality: N/A;  . LUMBAR LAMINECTOMY/DECOMPRESSION  MICRODISCECTOMY Bilateral 12/14/2012   Procedure: Bilateral lumbar three-four, four-five decompressive laminotomy/foraminotomy;  Surgeon: Charlie Pitter, MD;  Location: [place] NEURO ORS;  Service: Neurosurgery;  Laterality: Bilateral;  . PULSE GENERATOR IMPLANT Bilateral 12/13/2013   Procedure: Bilateral implantable pulse generator placement;  Surgeon: Erline Levine, MD;  Location: [place] NEURO ORS;  Service: Neurosurgery;  Laterality: Bilateral;  Bilateral implantable pulse generator placement  . skin cancer removed     from ears,   12 lft arm  rt leg 15  . SUBTHALAMIC STIMULATOR BATTERY REPLACEMENT Bilateral 07/14/2017   Procedure: BILATERAL IMPLANTED PULSE GENERATOR CHANGE FOR DEEP BRAIN STIMULATOR;  Surgeon: Erline Levine, MD;  Location: [place];  Service: Neurosurgery;  Laterality: Bilateral;  . SUBTHALAMIC STIMULATOR INSERTION Bilateral 12/06/2013   Procedure: SUBTHALAMIC STIMULATOR INSERTION;  Surgeon: Erline Levine, MD;  Location: [place] NEURO ORS;  Service: Neurosurgery;  Laterality: Bilateral;  Bilateral deep brain stimulator placement    There were no vitals filed for this visit.   Subjective Assessment - 01/20/20 0927    Subjective Patient reports he had increased of pain affecting his sleep last night. Reports pain the day after every PT session, worsening when getting up from chair.    Patient is accompained by: Family member    Pertinent History [name] is a 69  y.o. male with a hx of HTN, bradycardia, Parkinson's s/p deep brain stimulator 11/2013 , HLD, depression, possible seizure disorder, arthritis, depression/PTSD, dysrhythmia, GERD, sleep apnea. Patient was seen by this therapist in years past. Patient had a fall around Halloween of 2020 and admitted for ORIF on 02/18/19, was discharged to SNF and had [place] around Christmas. Had home health therapy for a month and a half. After that tried to go to Outpatient Therapy for 3 sessions at [facility] and was not pleased due to how busy and crowded  the location was. Fell last week in bathroom, didn't take walker in, falls about 2x month. Is very weak in RLE. Patient has a new lift chair and U drive walking. Has a caregiver 4 days a week. Has additional order for radiculopathy, lumbar region. PMH of lumbar laminectomy/decompression microdiscectomy (L3-4, 4-5)  In 2014.  Lumbar 4-5 cage +rod (2016).    Limitations Walking;Standing;Sitting;House hold activities;Lifting    How long can you sit comfortably? not limited with a backrest, without a back rest 3 minutes    How long can you stand comfortably? 3-5 minutes with holding on.    Patient Stated Goals pain reduction of R hip. strength of legs, dance with wife. push lawnmower.    Currently in Pain? Yes    Pain Score 3     Pain Location Back    Pain Orientation Lower    Pain Type Chronic pain    Pain Onset More than a month ago    Pain Frequency Intermittent           Current pain 3/10 Last night pain: 4-5/10  BP seated: 148/72     Supine: Hamstring stretch x 60 seconds each leg. PT educates caregiver on proper form and execution of task.  R hip inferior distractionwith belt SAD (short arc distraction)for pain management. 30 second hold6x.  R hiplateral distraction (SAD) with belt 4x 30 seconds  Swiss ball TrA activation pressing into ball with UE's and LEs 10x 3 second holds  Swiss ball TrA activation with UE raise 10x each UE, cues for sequencing for core activation and arm movement.   Swiss ball bilateral UE raise reaching back then tossing to SPT at feet, then reach and grab ball from SPT and return to reach overhead again 10x   2lb ankle weight, half foam roller between feet to elicit a neutral alignment of LE's with movement -march with tactile stabilization to RLE 15x each LE -heel slides with tactile stabilization to RLE 10x each LE   Seated:  STM lumbar paraspinals bilaterally with pt seated d/t shoulder pain in prone.Implementation of effleurage  and ptrissage for muscle tension reduction and pain reduction.  Seated swiss ball forward rollout 10x  Seated swiss ball lateral rollouts 10x   Pt educated throughout session about proper posture and technique with exercises. Improved exercise technique, movement at target joints, use of target muscles after min to mod verbal, visual, tactile cues                      PT Education - 01/20/20 1926    Education provided Yes    Education Details exercise technique, body mechanics    Person(s) Educated Patient    Methods Explanation;Demonstration;Tactile cues;Verbal cues    Comprehension Verbalized understanding;Returned demonstration;Verbal cues required;Tactile cues required            PT Short Term Goals - 01/08/20 1024      PT SHORT TERM GOAL #  1   Title Pt will perform HEP with family's/aide's supervision, for improved balance, transfers, and gait.    Baseline 5/5: HEP given 6/28: HEP compliant 8/23: aide helps with HEP    Time 4    Period Weeks    Status Achieved    Target Date 09/19/19             PT Long Term Goals - 01/08/20 1021      PT LONG TERM GOAL #1   Title Patient (> 35 years old) will complete five times sit to stand test in < 15 seconds without UE support without LOB indicating an increased LE strength and improved balance.    Baseline 5/5: 19 seconds one posterior LOB 6/28: 12.32 no UE support    Time 8    Period Weeks    Status Achieved      PT LONG TERM GOAL #2   Title Patient will increase 10 meter walk test to >1.54m/s as to improve gait speed for better community ambulation and to reduce fall risk.    Baseline 4/4: 0.71 m/s with U step walker 6/28: 0.917 m/s with U step rollator 8/23: >1.0 m/s with rollator    Time 8    Period Weeks    Status Achieved      PT LONG TERM GOAL #3   Title Patient will increase Berg Balance score by > 6 points (32/56) to demonstrate decreased fall risk during functional activities.    Baseline  5/5: 26/56 6/28: 36/56    Time 8    Period Weeks    Status Achieved      PT LONG TERM GOAL #4   Title Patient will increase FOTO score to equal to or greater than 31/100  to demonstrate statistically significant improvement in mobility and quality of life.    Baseline 5/5: 12/100 6/28: 38% 8/23: 11.7 9/22: 45%    Time 8    Period Weeks    Status Achieved      PT LONG TERM GOAL #5   Title Patient will increase six minute walk test distance to >1000 for progression to community ambulator and improve gait ability    Baseline 6/28: 750 ft with U step rollator 8/23: 775 w rolling walker, increased crouch pattern of ambulation 9/22: deferred due to pain    Time 8    Period Weeks    Status Partially Met    Target Date 02/03/20      PT LONG TERM GOAL #6   Title Patient will increase Berg Balance score by > 6 points (42/56) to demonstrate decreased fall risk during functional activities.    Baseline 6/28: 36/56 8/23: 42/56 9/22 44/56    Time 8    Period Weeks    Status Partially Met    Target Date 02/03/20      PT LONG TERM GOAL #7   Title Patient will report a worst pain of 3/10 on VAS in low back to improve tolerance with ADLs and reduced symptoms with activities.    Baseline 8/9: 9/10 8/23: 7/10 pain 9/22: 7/10    Time 8    Period Weeks    Status Partially Met    Target Date 02/03/20      PT LONG TERM GOAL #8   Title Patient will reduce modified Oswestry score to <20 as to demonstrate minimal disability with ADLs including improved sleeping tolerance, walking/sitting tolerance etc for better mobility with ADLs.    Baseline 8/9: 64% 8/23: 66%  9/22:44%    Time 8    Period Weeks    Status Partially Met    Target Date 02/03/20                 Plan - 01/20/20 1931    Clinical Impression Statement Patient initially presented with higher pain levels today however reports relief of symptoms by end of session. Due to continuation of pain the day after treatment sessions  indicating limited carryover between sessions patient's POC will be reviewed with spouse. Patient would benefit from skilled physical therapy to increase strength, stability, balance, and decrease pain to improve quality of life    Personal Factors and Comorbidities Age;Comorbidity 3+;Past/Current Experience;Time since onset of injury/illness/exacerbation;Transportation    Comorbidities athritis, bradycardia, cancer, depression, dysrhythmia, GERD, HTN, Parkinson's, PTSD, SOB, sleep apnea    Examination-Activity Limitations Bathing;Bed Mobility;Caring for Others;Bend;Dressing;Lift;Locomotion Level;Reach Overhead;Sit;Squat;Stairs;Stand;Toileting;Transfers    Examination-Participation Restrictions Church;Cleaning;Community Activity;Interpersonal Relationship;Shop;Volunteer;Yard Work;Other    Stability/Clinical Decision Making Stable/Uncomplicated    Rehab Potential Fair    PT Frequency 2x / week    PT Duration 8 weeks    PT Treatment/Interventions ADLs/Self Care Home Management;Aquatic Therapy;Cryotherapy;Electrical Stimulation;Iontophoresis 4mg /ml Dexamethasone;Moist Heat;Ultrasound;Contrast Bath;DME Instruction;Gait training;Stair training;Functional mobility training;Therapeutic activities;Therapeutic exercise;Balance training;Neuromuscular re-education;Patient/family education;Manual techniques;Wheelchair mobility training;Energy conservation;Passive range of motion;Traction;Orthotic Fit/Training;Dry needling;Vestibular;Visual/perceptual remediation/compensation;Taping;Canalith Repostioning    PT Next Visit Plan core and pain relief    PT Home Exercise Plan [place] and Agree with Plan of Care Patient;Family member/caregiver    Family Member Consulted wife           Patient will benefit from skilled therapeutic intervention in order to improve the following deficits and impairments:  Abnormal gait, Decreased balance, Decreased coordination, Decreased mobility, Impaired tone, Postural  dysfunction, Decreased strength, Decreased safety awareness, Improper body mechanics, Impaired flexibility, Decreased activity tolerance, Decreased endurance, Decreased knowledge of precautions, Difficulty walking, Pain, Cardiopulmonary status limiting activity, Decreased range of motion, Impaired perceived functional ability  Visit Diagnosis: Muscle weakness (generalized)  Other lack of coordination  Parkinson's disease (H[facility])  Unsteadiness on feet  Pain in right hip     Problem List Patient Active Problem List   Diagnosis Date Noted  . E. coli bacteremia 02/23/2019  . Dysphagia 02/23/2019  . Aspiration pneumonia ([place]) 02/21/2019  . Closed right hip fracture ([place]) 02/16/2019  . Rib pain on right side 09/25/2018  . Skin sore 12/01/2017  . Hematoma 10/18/2017  . Fall at home 10/18/2017  . 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 Radicular pain in right arm 10/09/2016  . GERD (gastroesophageal reflux disease) 07/31/2016  . Trochanteric bursitis 03/03/2016  . Left hand pain 10/30/2015  . Fracture, finger, multiple sites 10/30/2015  . Colon cancer screening 07/23/2015  . Healthcare maintenance 07/23/2015  . Gout 02/18/2015  . Depression 01/06/2015  . Irritation of eyelid 08/20/2014  . Dupuytren's contracture 08/20/2014  . Cough 07/21/2014  . Lumbar stenosis with neurogenic claudication 06/13/2014  . Preop cardiovascular exam 05/28/2014  . S/P deep brain stimulator placement 05/08/2014  . Joint pain 01/22/2014  . Medicare annual wellness visit, initial 01/19/2014  . Advance care planning 01/19/2014  . Parkinson's disease ([place]) 12/13/2013  . PTSD (post-traumatic stress disorder) 06/13/2013  . Erectile dysfunction 06/13/2013  . HLD (hyperlipidemia) 06/13/2013  . Hip pain 06/10/2013  . Pain in joint, shoulder region 06/10/2013  . Right leg swelling 06/03/2013  . Essential hypertension 06/03/2013  . Bradycardia by electrocardiogram 06/03/2013  . Obstructive sleep apnea  03/12/2013  . Spinal stenosis, lumbar  region, with neurogenic claudication 12/14/2012   Janna Arch, PT, DPT   01/20/2020, 7:33 PM  [place] MAIN [facility] SERVICES [address] [place], [place], [address] Phone: [phone]   Fax:  [phone]  Name: [name] MRN: [id number] Date of Birth: June 20, 1950

## 2020-01-20 NOTE — Therapy (Signed)
[place] MAIN [facility] SERVICES [address] [place], [place], [address] Phone: [phone]   Fax:  [phone]  Occupational Therapy Treatment  Patient Details  Name: [name] MRN: [id number] Date of Birth: 09-06-1950 No data recorded  Encounter Date: 01/20/2020   OT End of Session - 01/20/20 1018    Visit Number 8    Number of Visits 24    Date for OT Re-Evaluation 03/06/20    Authorization Type reporting period starting 12/11/2019    OT Start Time 1000    OT Stop Time 1045    OT Time Calculation (min) 45 min    Activity Tolerance Patient tolerated treatment well    Behavior During Therapy [facility] for tasks assessed/performed           Past Medical History:  Diagnosis Date  . Arthritis   . Bradycardia   . Cancer [facility]) 2013   skin cancer  . Depression    ptsd  . Dysrhythmia    chronic slow heart rate  . GERD (gastroesophageal reflux disease)   . Headache(784.0)    tension headaches non recent  . History of chicken pox   . History of kidney stones    passed  . Hypertension    treated with HCTZ  . Parkinson's disease ([facility])    dx'ed 15 years ago  . PTSD (post-traumatic stress disorder)   . Shortness of breath dyspnea   . Sleep apnea    doesn't use C-pap  . Varicose veins     Past Surgical History:  Procedure Laterality Date  . CHOLECYSTECTOMY N/A 10/22/2014   Procedure: LAPAROSCOPIC CHOLECYSTECTOMY WITH INTRAOPERATIVE CHOLANGIOGRAM;  Surgeon: Dia Crawford III, MD;  Location: [facility] ORS;  Service: General;  Laterality: N/A;  . cyst removed      from lip as a child  . INTRAMEDULLARY (IM) NAIL INTERTROCHANTERIC N/A 02/18/2019   Procedure: INTRAMEDULLARY (IM) NAIL INTERTROCHANTRIC, RIGHT,;  Surgeon: Thornton Park, MD;  Location: [facility] ORS;  Service: Orthopedics;  Laterality: N/A;  . LUMBAR LAMINECTOMY/DECOMPRESSION MICRODISCECTOMY Bilateral 12/14/2012   Procedure: Bilateral lumbar three-four, four-five decompressive  laminotomy/foraminotomy;  Surgeon: Charlie Pitter, MD;  Location: [place] NEURO ORS;  Service: Neurosurgery;  Laterality: Bilateral;  . PULSE GENERATOR IMPLANT Bilateral 12/13/2013   Procedure: Bilateral implantable pulse generator placement;  Surgeon: Erline Levine, MD;  Location: [place] NEURO ORS;  Service: Neurosurgery;  Laterality: Bilateral;  Bilateral implantable pulse generator placement  . skin cancer removed     from ears,   12 lft arm  rt leg 15  . SUBTHALAMIC STIMULATOR BATTERY REPLACEMENT Bilateral 07/14/2017   Procedure: BILATERAL IMPLANTED PULSE GENERATOR CHANGE FOR DEEP BRAIN STIMULATOR;  Surgeon: Erline Levine, MD;  Location: [place];  Service: Neurosurgery;  Laterality: Bilateral;  . SUBTHALAMIC STIMULATOR INSERTION Bilateral 12/06/2013   Procedure: SUBTHALAMIC STIMULATOR INSERTION;  Surgeon: Erline Levine, MD;  Location: [place] NEURO ORS;  Service: Neurosurgery;  Laterality: Bilateral;  Bilateral deep brain stimulator placement    There were no vitals filed for this visit.   Subjective Assessment - 01/20/20 1017    Subjective  Pt. reports have a good weekend.    Pertinent History Pt. is a 69 yo male who has a history of Parkinson's Disease. Pt. has a Deep Brain Stimulator in place. Pt. has a history of Low Back Pain with 2 back surgeries in August 2014, and February 2016. Pt. has a history of multiple frequent falls, unsure of how many falls in the last 6 months.  Currently in Pain? No/denies          OT Treatment  Therapeutic Exercise:  Pt. performed 3# dowel ex. For UE strengthening secondary to weakness. Bilateral shoulder flexion, chest press, formulating horizontal v pattern, and circular patterns were performed 1 set of 10- 20, and increased to 3# for 1 set of 10-20 reps each. Pt. Performed 4# dumbbell ex. For elbow flexion, extension. 3# for forearm supination/pronation, wrist flexion/extension.Pt. requires rest breaks and verbal cues, tactile cues, and cues for visual  demonstrationfor proper technique.Pt. required increased time to complete  Self-care:  Pt. education was provided about A/E use for LE ADLs. Pt. required modA to doff his right sock with the reacher requiring moderate cues. Pt. was independent with doffing the left sock without A/E. Pt. required Supervision with verbal cues, and visual demonstration to use the sockaide to donn socks. Pt. was independent with donning, and doffing bilateral shoes.   Pt. was able to demonstrate sockaide use for donning socks with Supervision, and visual demonstration. Pt. tolerated increased weight today. Pt. presented with improved task initiation today.Pt.requiredincreased timeverbal, and visual cues for proper there. Ex. form, and pace during the exercises. Pt. continues to work on improving UE strength, [facility] skill in order to work towards improving, and maximizing independence with ADLs, and IADLs.                       OT Education - 01/20/20 1018    Education provided Yes    Education Details goals, POC    Person(s) Educated Patient;Spouse    Methods Explanation    Comprehension Verbalized understanding               OT Long Term Goals - 12/13/19 2046      OT LONG TERM GOAL #1   Title Pt. will improve Bilateral UE strength by 1 mm grade to assist with ADLs, and IADLs    Baseline BUE strength 4/5 overall at eval    Time 12    Period Weeks    Status New    Target Date 03/06/20      OT LONG TERM GOAL #2   Title Pt. will improve grip strength by 10# to open jars and containers.    Baseline difficulty at eval    Time 12    Period Weeks    Status New    Target Date 03/06/20      OT LONG TERM GOAL #3   Title Patient will complete lower body dressing with modified independence.    Baseline min to mod assist at eval    Time 12    Period Weeks    Status New    Target Date 03/06/20      OT LONG TERM GOAL #4   Title Patient to demonstrate the ability to vacuum  flooring at home with use of modified techniques with supervision.    Baseline difficulty at eval    Time 12    Period Weeks    Status New    Target Date 03/06/20      OT LONG TERM GOAL #5   Title Patient will demonstrate the ability to retrieve a snack and transport it to the living room with modified independence.    Baseline unable at eval    Time 12    Period Weeks    Status New    Target Date 03/06/20      OT LONG TERM GOAL #6   Title  Patient will improve hand function to be able to perform cutting of food with modified independence.    Baseline wife helps to cut food when needed.    Time 8    Period Weeks    Status New    Target Date 02/05/20      OT LONG TERM GOAL #7   Title Pt. will write a paragraph efficiently with 90% legibility.    Baseline decreased legibility and poor hand endurance for writing more than 1 sentence.    Time 8    Period Weeks    Status New    Target Date 02/05/20      OT LONG TERM GOAL  #10   TITLE Pt will don and doff pants with supervision    Baseline min assist to manage pants    Time 12    Period Weeks    Status New    Target Date 03/06/20                 Plan - 01/20/20 1018    Clinical Impression Statement Pt. was able to demonstrate sockaide use for donning socks with Supervision, and visual demonstration. Pt. tolerated increased weight today. Pt. presented with improved task initiation today.Pt.requiredincreased timeverbal, and visual cues for proper there. Ex. form, and pace during the exercises. Pt. continues to work on improving UE strength, [facility] skill in order to work towards improving, and maximizing independence with ADLs, and IADLs.    OT Occupational Profile and History Detailed Assessment- Review of Records and additional review of physical, cognitive, psychosocial history related to current functional performance    Occupational performance deficits (Please refer to evaluation for details): ADL's;IADL's;Leisure     Body Structure / Function / Physical Skills ADL;Coordination;Endurance;GMC;UE functional use;Balance;Decreased knowledge of use of DME;IADL;Pain;Dexterity;FMC;Strength;Mobility;ROM    Cognitive Skills Attention;Problem Solve;Memory    Psychosocial Skills Coping Strategies;Environmental  Adaptations;Habits;Routines and Behaviors    Rehab Potential Good    Clinical Decision Making Limited treatment options, no task modification necessary    Comorbidities Affecting Occupational Performance: Presence of comorbidities impacting occupational performance    Comorbidities impacting occupational performance description: risk for falls, slow to respond and process information at times, safety    Modification or Assistance to Complete Evaluation  No modification of tasks or assist necessary to complete eval    OT Frequency 2x / week    OT Duration 12 weeks    OT Treatment/Interventions Self-care/ADL training;Therapeutic exercise;DME and/or AE instruction;Energy conservation;Neuromuscular education;Patient/family education;Therapeutic activities;Moist Heat;Functional Mobility Training;Cognitive remediation/compensation    Consulted and Agree with Plan of Care Patient           Patient will benefit from skilled therapeutic intervention in order to improve the following deficits and impairments:   Body Structure / Function / Physical Skills: ADL, Coordination, Endurance, GMC, UE functional use, Balance, Decreased knowledge of use of DME, IADL, Pain, Dexterity, FMC, Strength, Mobility, ROM Cognitive Skills: Attention, Problem Solve, Memory Psychosocial Skills: Coping Strategies, Environmental  Adaptations, Habits, Routines and Behaviors   Visit Diagnosis: Muscle weakness (generalized)  Other lack of coordination    Problem List Patient Active Problem List   Diagnosis Date Noted  . E. coli bacteremia 02/23/2019  . Dysphagia 02/23/2019  . Aspiration pneumonia ([place]) 02/21/2019  . Closed right hip  fracture ([place]) 02/16/2019  . Rib pain on right side 09/25/2018  . Skin sore 12/01/2017  . Hematoma 10/18/2017  . Fall at home 10/18/2017  . 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 Radicular pain in  right arm 10/09/2016  . GERD (gastroesophageal reflux disease) 07/31/2016  . Trochanteric bursitis 03/03/2016  . Left hand pain 10/30/2015  . Fracture, finger, multiple sites 10/30/2015  . Colon cancer screening 07/23/2015  . Healthcare maintenance 07/23/2015  . Gout 02/18/2015  . Depression 01/06/2015  . Irritation of eyelid 08/20/2014  . Dupuytren's contracture 08/20/2014  . Cough 07/21/2014  . Lumbar stenosis with neurogenic claudication 06/13/2014  . Preop cardiovascular exam 05/28/2014  . S/P deep brain stimulator placement 05/08/2014  . Joint pain 01/22/2014  . Medicare annual wellness visit, initial 01/19/2014  . Advance care planning 01/19/2014  . Parkinson's disease ([place]) 12/13/2013  . PTSD (post-traumatic stress disorder) 06/13/2013  . Erectile dysfunction 06/13/2013  . HLD (hyperlipidemia) 06/13/2013  . Hip pain 06/10/2013  . Pain in joint, shoulder region 06/10/2013  . Right leg swelling 06/03/2013  . Essential hypertension 06/03/2013  . Bradycardia by electrocardiogram 06/03/2013  . Obstructive sleep apnea 03/12/2013  . Spinal stenosis, lumbar region, with neurogenic claudication 12/14/2012    Harrel Carina, MS, OTR/L 01/20/2020, 10:20 AM  [place] MAIN [facility] SERVICES [place], [place], [address] Phone: [phone]   Fax:  [phone]  Name: [name] MRN: [id number] Date of Birth: 1950-08-10

## 2020-01-22 ENCOUNTER — Ambulatory Visit: Payer: Medicare PPO

## 2020-01-22 ENCOUNTER — Other Ambulatory Visit: Payer: Self-pay

## 2020-01-22 ENCOUNTER — Ambulatory Visit: Payer: Medicare PPO | Admitting: Occupational Therapy

## 2020-01-22 DIAGNOSIS — M6281 Muscle weakness (generalized): Secondary | ICD-10-CM | POA: Diagnosis not present

## 2020-01-22 DIAGNOSIS — G8929 Other chronic pain: Secondary | ICD-10-CM | POA: Diagnosis not present

## 2020-01-22 DIAGNOSIS — R2681 Unsteadiness on feet: Secondary | ICD-10-CM | POA: Diagnosis not present

## 2020-01-22 DIAGNOSIS — R278 Other lack of coordination: Secondary | ICD-10-CM | POA: Diagnosis not present

## 2020-01-22 DIAGNOSIS — G20 Parkinson's disease: Secondary | ICD-10-CM | POA: Diagnosis not present

## 2020-01-22 DIAGNOSIS — M545 Low back pain, unspecified: Secondary | ICD-10-CM | POA: Diagnosis not present

## 2020-01-22 DIAGNOSIS — R296 Repeated falls: Secondary | ICD-10-CM | POA: Diagnosis not present

## 2020-01-22 DIAGNOSIS — R2689 Other abnormalities of gait and mobility: Secondary | ICD-10-CM | POA: Diagnosis not present

## 2020-01-22 DIAGNOSIS — M25551 Pain in right hip: Secondary | ICD-10-CM

## 2020-01-22 NOTE — Therapy (Signed)
[place] [facility] MAIN [facility] SERVICES [address] [place], [place], [address] Phone: [phone]   Fax:  [phone]  Physical Therapy Treatment  Patient Details  Name: [name] MRN: [id number] Date of Birth: 1950-09-29 Referring Provider (PT): Si Gaul MD    Encounter Date: 01/22/2020   PT End of Session - 01/22/20 1034    Visit Number 33    Number of Visits 40    Date for PT Re-Evaluation 02/03/20    Authorization Type 3/10 PN 9/22    Authorization Time Period FOTO- PT    PT Start Time 0915    PT Stop Time 0959    PT Time Calculation (min) 44 min    Equipment Utilized During Treatment Gait belt    Activity Tolerance Patient tolerated treatment well;Patient limited by pain    Behavior During Therapy [facility] for tasks assessed/performed           Past Medical History:  Diagnosis Date  . Arthritis   . Bradycardia   . Cancer [facility]) 2013   skin cancer  . Depression    ptsd  . Dysrhythmia    chronic slow heart rate  . GERD (gastroesophageal reflux disease)   . Headache(784.0)    tension headaches non recent  . History of chicken pox   . History of kidney stones    passed  . Hypertension    treated with HCTZ  . Parkinson's disease (HCC)    dx'ed 15 years ago  . PTSD (post-traumatic stress disorder)   . Shortness of breath dyspnea   . Sleep apnea    doesn't use C-pap  . Varicose veins     Past Surgical History:  Procedure Laterality Date  . CHOLECYSTECTOMY N/A 10/22/2014   Procedure: LAPAROSCOPIC CHOLECYSTECTOMY WITH INTRAOPERATIVE CHOLANGIOGRAM;  Surgeon: Tiney Rouge III, MD;  Location: [facility] ORS;  Service: General;  Laterality: N/A;  . cyst removed      from lip as a child  . INTRAMEDULLARY (IM) NAIL INTERTROCHANTERIC N/A 02/18/2019   Procedure: INTRAMEDULLARY (IM) NAIL INTERTROCHANTRIC, RIGHT,;  Surgeon: Juanell Fairly, MD;  Location: [facility] ORS;  Service: Orthopedics;  Laterality: N/A;  . LUMBAR LAMINECTOMY/DECOMPRESSION  MICRODISCECTOMY Bilateral 12/14/2012   Procedure: Bilateral lumbar three-four, four-five decompressive laminotomy/foraminotomy;  Surgeon: Temple Pacini, MD;  Location: [facility] OR[facility];  Service: Neurosurgery;  Laterality: Bilateral;  . PULSE GENERATOR IMPLANT Bilateral 12/13/2013   Procedure: Bilateral implantable pulse generator placement;  Surgeon: Maeola Harman, MD;  Location: [facility] OR[facility];  Service: Neurosurgery;  Laterality: Bilateral;  Bilateral implantable pulse generator placement  . skin cancer removed     from ears,   12 lft arm  rt leg 15  . SUBTHALAMIC STIMULATOR BATTERY REPLACEMENT Bilateral 07/14/2017   Procedure: BILATERAL IMPLANTED PULSE GENERATOR CHANGE FOR DEEP BRAIN STIMULATOR;  Surgeon: Maeola Harman, MD;  Location: [facility] OR;  Service: Neurosurgery;  Laterality: Bilateral;  . SUBTHALAMIC STIMULATOR INSERTION Bilateral 12/06/2013   Procedure: SUBTHALAMIC STIMULATOR INSERTION;  Surgeon: Maeola Harman, MD;  Location: [facility];  Service: Neurosurgery;  Laterality: Bilateral;  Bilateral deep brain stimulator placement    There were no vitals filed for this visit.   Subjective Assessment - 01/22/20 1033    Subjective Patient reports his back pain is improved today. No falls or LOB since last session. Is present with aide.    Patient is accompained by: Family member    Pertinent History [name] is a 69 y.o. male with a hx of HTN,  bradycardia, Parkinson's s/p deep brain stimulator 11/2013 , HLD, depression, possible seizure disorder, arthritis, depression/PTSD, dysrhythmia, GERD, sleep apnea. Patient was seen by this therapist in years past. Patient had a fall around Halloween of 2020 and admitted for ORIF on 02/18/19, was discharged to SNF and had [place] around Christmas. Had home health therapy for a month and a half. After that tried to go to Outpatient Therapy for 3 sessions at [facility] and was not pleased due to how busy and crowded the location was. Fell last week in bathroom,  didn't take walker in, falls about 2x month. Is very weak in RLE. Patient has a new lift chair and U drive walking. Has a caregiver 4 days a week. Has additional order for radiculopathy, lumbar region. PMH of lumbar laminectomy/decompression microdiscectomy (L3-4, 4-5)  In 2014.  Lumbar 4-5 cage +rod (2016).    Limitations Walking;Standing;Sitting;House hold activities;Lifting    How long can you sit comfortably? not limited with a backrest, without a back rest 3 minutes    How long can you stand comfortably? 3-5 minutes with holding on.    Patient Stated Goals pain reduction of R hip. strength of legs, dance with wife. push lawnmower.    Currently in Pain? Yes    Pain Score 4     Pain Location Back    Pain Orientation Lower    Pain Descriptors / Indicators Aching    Pain Type Chronic pain    Pain Onset More than a month ago    Pain Frequency Intermittent                Treatment:   Standing in // bars:  2x4 between feet: forward, backwards ambulation with BUE support, cues for hand placement, widening BOS and equal weight shift x 8 lengths of // bars Speed ladder: one foot each square, cues for keeping feet wide for improved BOS x 8 lengths of // bars  Balloon taps reaching inside/outside BOS, close CGA and cues for L knee quad activation x 4 minutes  Forward/backwards walking in // bars with decreasing UE support to no UE support, cues for keeping wider BOS x 6 lengths of // bars  3lb ankle weight: Lateral stepping 6x length of // bars BUE support cues for foot clearance High knee marching with BUE support 4x length of // bars, cues for keeping feet apart for widend BOS    Supine:    Seated:  3lb ankle weight: Marching no UE support 15x each LE, arms crossed LAQ 3 second holds 15x each LE.    Seated swiss ball forward rollout 10x   Seated swiss ball lateral rollouts 10x    Pt educated throughout session about proper posture and technique with exercises.  Improved exercise technique, movement at target joints, use of target muscles after min to mod verbal, visual, tactile cues  Patient session limited due to need to use restroom mid session. Aide and patient shown to restroom.   Restroom attempt second time was not made in time, patient had episode of fecal incontinence requiring clean up, paper clothing, bags for soiled clothes, extensive cleaning of self with aid of PT and aide.                  PT Education - 01/22/20 1034    Education provided Yes    Education Details exercise technique, body mechanics    Person(s) Educated Patient    Methods Explanation;Demonstration;Tactile cues;Verbal cues    Comprehension Need further  instruction;Verbalized understanding;Returned demonstration;Verbal cues required;Tactile cues required            PT Short Term Goals - 01/08/20 1024      PT SHORT TERM GOAL #1   Title Pt will perform HEP with family's/aide's supervision, for improved balance, transfers, and gait.    Baseline 5/5: HEP given 6/28: HEP compliant 8/23: aide helps with HEP    Time 4    Period Weeks    Status Achieved    Target Date 09/19/19             PT Long Term Goals - 01/08/20 1021      PT LONG TERM GOAL #1   Title Patient (> 17 years old) will complete five times sit to stand test in < 15 seconds without UE support without LOB indicating an increased LE strength and improved balance.    Baseline 5/5: 19 seconds one posterior LOB 6/28: 12.32 no UE support    Time 8    Period Weeks    Status Achieved      PT LONG TERM GOAL #2   Title Patient will increase 10 meter walk test to >1.40m/s as to improve gait speed for better community ambulation and to reduce fall risk.    Baseline 4/4: 0.71 m/s with U step walker 6/28: 0.917 m/s with U step rollator 8/23: >1.0 m/s with rollator    Time 8    Period Weeks    Status Achieved      PT LONG TERM GOAL #3   Title Patient will increase Berg Balance score by > 6  points (32/56) to demonstrate decreased fall risk during functional activities.    Baseline 5/5: 26/56 6/28: 36/56    Time 8    Period Weeks    Status Achieved      PT LONG TERM GOAL #4   Title Patient will increase FOTO score to equal to or greater than 31/100  to demonstrate statistically significant improvement in mobility and quality of life.    Baseline 5/5: 12/100 6/28: 38% 8/23: 11.7 9/22: 45%    Time 8    Period Weeks    Status Achieved      PT LONG TERM GOAL #5   Title Patient will increase six minute walk test distance to >1000 for progression to community ambulator and improve gait ability    Baseline 6/28: 750 ft with U step rollator 8/23: 775 w rolling walker, increased crouch pattern of ambulation 9/22: deferred due to pain    Time 8    Period Weeks    Status Partially Met    Target Date 02/03/20      PT LONG TERM GOAL #6   Title Patient will increase Berg Balance score by > 6 points (42/56) to demonstrate decreased fall risk during functional activities.    Baseline 6/28: 36/56 8/23: 42/56 9/22 44/56    Time 8    Period Weeks    Status Partially Met    Target Date 02/03/20      PT LONG TERM GOAL #7   Title Patient will report a worst pain of 3/10 on VAS in low back to improve tolerance with ADLs and reduced symptoms with activities.    Baseline 8/9: 9/10 8/23: 7/10 pain 9/22: 7/10    Time 8    Period Weeks    Status Partially Met    Target Date 02/03/20      PT LONG TERM GOAL #8   Title Patient  will reduce modified Oswestry score to <20 as to demonstrate minimal disability with ADLs including improved sleeping tolerance, walking/sitting tolerance etc for better mobility with ADLs.    Baseline 8/9: 64% 8/23: 66% 9/22:44%    Time 8    Period Weeks    Status Partially Met    Target Date 02/03/20                 Plan - 01/22/20 1040    Clinical Impression Statement Patient presents to physical therapy session with decreased pain. Re-initiated standing  strengthening and stability in // bars however session limited by patient needing to use restroom. Decreased knee flexion collapse in standing noted today with patient tolerating standing well.  Patient would benefit from skilled physical therapy to increase strength, stability, balance, and decrease pain to improve quality of life    Personal Factors and Comorbidities Age;Comorbidity 3+;Past/Current Experience;Time since onset of injury/illness/exacerbation;Transportation    Comorbidities athritis, bradycardia, cancer, depression, dysrhythmia, GERD, HTN, Parkinson's, PTSD, SOB, sleep apnea    Examination-Activity Limitations Bathing;Bed Mobility;Caring for Others;Bend;Dressing;Lift;Locomotion Level;Reach Overhead;Sit;Squat;Stairs;Stand;Toileting;Transfers    Examination-Participation Restrictions Church;Cleaning;Community Activity;Interpersonal Relationship;Shop;Volunteer;Yard Work;Other    Stability/Clinical Decision Making Stable/Uncomplicated    Rehab Potential Fair    PT Frequency 2x / week    PT Duration 8 weeks    PT Treatment/Interventions ADLs/Self Care Home Management;Aquatic Therapy;Cryotherapy;Electrical Stimulation;Iontophoresis 4mg /ml Dexamethasone;Moist Heat;Ultrasound;Contrast Bath;DME Instruction;Gait training;Stair training;Functional mobility training;Therapeutic activities;Therapeutic exercise;Balance training;Neuromuscular re-education;Patient/family education;Manual techniques;Wheelchair mobility training;Energy conservation;Passive range of motion;Traction;Orthotic Fit/Training;Dry needling;Vestibular;Visual/perceptual remediation/compensation;Taping;Canalith Repostioning    PT Next Visit Plan core and pain relief    PT Home Exercise Plan [place] and Agree with Plan of Care Patient;Family member/caregiver    Family Member Consulted wife           Patient will benefit from skilled therapeutic intervention in order to improve the following deficits and  impairments:  Abnormal gait, Decreased balance, Decreased coordination, Decreased mobility, Impaired tone, Postural dysfunction, Decreased strength, Decreased safety awareness, Improper body mechanics, Impaired flexibility, Decreased activity tolerance, Decreased endurance, Decreased knowledge of precautions, Difficulty walking, Pain, Cardiopulmonary status limiting activity, Decreased range of motion, Impaired perceived functional ability  Visit Diagnosis: Muscle weakness (generalized)  Other lack of coordination  Parkinson's disease (HCC)  Unsteadiness on feet  Pain in right hip     Problem List Patient Active Problem List   Diagnosis Date Noted  . E. coli bacteremia 02/23/2019  . Dysphagia 02/23/2019  . Aspiration pneumonia ([place]) 02/21/2019  . Closed right hip fracture ([place]) 02/16/2019  . Rib pain on right side 09/25/2018  . Skin sore 12/01/2017  . Hematoma 10/18/2017  . Fall at home 10/18/2017  . 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 Radicular pain in right arm 10/09/2016  . GERD (gastroesophageal reflux disease) 07/31/2016  . Trochanteric bursitis 03/03/2016  . Left hand pain 10/30/2015  . Fracture, finger, multiple sites 10/30/2015  . Colon cancer screening 07/23/2015  . Healthcare maintenance 07/23/2015  . Gout 02/18/2015  . Depression 01/06/2015  . Irritation of eyelid 08/20/2014  . Dupuytren's contracture 08/20/2014  . Cough 07/21/2014  . Lumbar stenosis with neurogenic claudication 06/13/2014  . Preop cardiovascular exam 05/28/2014  . S/P deep brain stimulator placement 05/08/2014  . Joint pain 01/22/2014  . Medicare annual wellness visit, initial 01/19/2014  . Advance care planning 01/19/2014  . Parkinson's disease ([place]) 12/13/2013  . PTSD (post-traumatic stress disorder) 06/13/2013  . Erectile dysfunction 06/13/2013  . HLD (hyperlipidemia) 06/13/2013  . Hip pain 06/10/2013  .  Pain in joint, shoulder region 06/10/2013  . Right leg swelling 06/03/2013  .  Essential hypertension 06/03/2013  . Bradycardia by electrocardiogram 06/03/2013  . Obstructive sleep apnea 03/12/2013  . Spinal stenosis, lumbar region, with neurogenic claudication 12/14/2012   [name], PT, DPT   01/22/2020, 1:25 PM  [place] MAIN [facility] SERVICES [address] [place], [place], [address] Phone: [phone]   Fax:  [phone]  Name: [name] MRN: [id number] Date of Birth: November 19, 1950

## 2020-01-23 NOTE — Therapy (Signed)
[place] MAIN [facility] SERVICES [address] [place], [place], [address] Phone: [phone]   Fax:  [phone]  Patient Details  Name: CORDE ANTONINI MRN: [id number] Date of Birth: Aug 04, 1950 Referring Provider:  Sharolyn Douglas, MD  Encounter Date: 01/22/2020  Pt. arrived for his appointments today, however had stomach, and intestinal issues. Pt. had to leave the clinic prior to initiating the OT session today.  Harrel Carina 01/23/2020, 9:46 AM  [place] MAIN [facility] SERVICES [address] [place], [place], [address] Phone: [phone]   Fax:  [phone]

## 2020-01-24 ENCOUNTER — Encounter: Payer: Self-pay | Admitting: Emergency Medicine

## 2020-01-24 ENCOUNTER — Ambulatory Visit
Admission: EM | Admit: 2020-01-24 | Discharge: 2020-01-24 | Disposition: A | Discharge status: Home / Self Care | Payer: Medicare PPO | Attending: Emergency Medicine | Admitting: Emergency Medicine

## 2020-01-24 ENCOUNTER — Telehealth (INDEPENDENT_AMBULATORY_CARE_PROVIDER_SITE_OTHER): Payer: Medicare PPO

## 2020-01-24 DIAGNOSIS — Z711 Person with feared health complaint in whom no diagnosis is made: Secondary | ICD-10-CM

## 2020-01-24 DIAGNOSIS — R8290 Unspecified abnormal findings in urine: Secondary | ICD-10-CM | POA: Diagnosis not present

## 2020-01-24 DIAGNOSIS — R82998 Other abnormal findings in urine: Secondary | ICD-10-CM | POA: Insufficient documentation

## 2020-01-24 LAB — POCT URINALYSIS DIP (MANUAL ENTRY)
Bilirubin, UA: NEGATIVE
Glucose, UA: NEGATIVE mg/dL
Leukocytes, UA: NEGATIVE
Nitrite, UA: NEGATIVE
Protein Ur, POC: NEGATIVE mg/dL
Spec Grav, UA: 1.025 (ref 1.010–1.025)
Urobilinogen, UA: 0.2 E.U./dL
pH, UA: 6 (ref 5.0–8.0)

## 2020-01-24 NOTE — ED Triage Notes (Signed)
Pt c/o possible uti onset yesterday. Wife states she noticed last night his urine was orange. He denies any fever, back pain or dysuria.

## 2020-01-24 NOTE — Discharge Instructions (Addendum)
Your urine does not show signs of infection today we will send it for urine culture to confirm this and call you if anything comes back positive requiring treatment.    Follow up with your primary care provider if your symptoms are not improving.

## 2020-01-24 NOTE — Telephone Encounter (Signed)
[name] called triage line stating he is having possible blood in urine. Our schedules are full today and she will either take him to [facility] in [place] or [place].

## 2020-01-24 NOTE — ED Provider Notes (Signed)
Roderic Palau    CSN: [id number] Arrival date & time: 01/24/20  1144      History   Chief Complaint Chief Complaint  Patient presents with  . Hematuria  . Urinary Tract Infection    HPI [name] is a 69 y.o. male.  Accompanied by his wife, patient presents with hematuria since last night. He states his urine was "orange.". He denies fever, abdominal pain, back pain, dysuria, or other symptoms. His medical history includes Parkinson's disease and hypertension.  The history is provided by the patient, the spouse and medical records.    Past Medical History:  Diagnosis Date  . Arthritis   . Bradycardia   . Cancer [facility]) 2013   skin cancer  . Depression    ptsd  . Dysrhythmia    chronic slow heart rate  . GERD (gastroesophageal reflux disease)   . Headache(784.0)    tension headaches non recent  . History of chicken pox   . History of kidney stones    passed  . Hypertension    treated with HCTZ  . Parkinson's disease ([place])    dx'ed 15 years ago  . PTSD (post-traumatic stress disorder)   . Shortness of breath dyspnea   . Sleep apnea    doesn't use C-pap  . Varicose veins     Patient Active Problem List   Diagnosis Date Noted  . E. coli bacteremia 02/23/2019  . Dysphagia 02/23/2019  . Aspiration pneumonia ([place]) 02/21/2019  . Closed right hip fracture ([place]) 02/16/2019  . Rib pain on right side 09/25/2018  . Skin sore 12/01/2017  . Hematoma 10/18/2017  . Fall at home 10/18/2017  . 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 Radicular pain in right arm 10/09/2016  . GERD (gastroesophageal reflux disease) 07/31/2016  . Trochanteric bursitis 03/03/2016  . Left hand pain 10/30/2015  . Fracture, finger, multiple sites 10/30/2015  . Colon cancer screening 07/23/2015  . Healthcare maintenance 07/23/2015  . Gout 02/18/2015  . Depression 01/06/2015  . Irritation of eyelid 08/20/2014  . Dupuytren's contracture 08/20/2014  . Cough 07/21/2014  . Lumbar  stenosis with neurogenic claudication 06/13/2014  . Preop cardiovascular exam 05/28/2014  . S/P deep brain stimulator placement 05/08/2014  . Joint pain 01/22/2014  . Medicare annual wellness visit, initial 01/19/2014  . Advance care planning 01/19/2014  . Parkinson's disease ([place]) 12/13/2013  . PTSD (post-traumatic stress disorder) 06/13/2013  . Erectile dysfunction 06/13/2013  . HLD (hyperlipidemia) 06/13/2013  . Hip pain 06/10/2013  . Pain in joint, shoulder region 06/10/2013  . Right leg swelling 06/03/2013  . Essential hypertension 06/03/2013  . Bradycardia by electrocardiogram 06/03/2013  . Obstructive sleep apnea 03/12/2013  . Spinal stenosis, lumbar region, with neurogenic claudication 12/14/2012    Past Surgical History:  Procedure Laterality Date  . CHOLECYSTECTOMY N/A 10/22/2014   Procedure: LAPAROSCOPIC CHOLECYSTECTOMY WITH INTRAOPERATIVE CHOLANGIOGRAM;  Surgeon: Dia Crawford III, MD;  Location: [facility] ORS;  Service: General;  Laterality: N/A;  . cyst removed      from lip as a child  . INTRAMEDULLARY (IM) NAIL INTERTROCHANTERIC N/A 02/18/2019   Procedure: INTRAMEDULLARY (IM) NAIL INTERTROCHANTRIC, RIGHT,;  Surgeon: Thornton Park, MD;  Location: [facility] ORS;  Service: Orthopedics;  Laterality: N/A;  . LUMBAR LAMINECTOMY/DECOMPRESSION MICRODISCECTOMY Bilateral 12/14/2012   Procedure: Bilateral lumbar three-four, four-five decompressive laminotomy/foraminotomy;  Surgeon: Charlie Pitter, MD;  Location: [place] NEURO ORS;  Service: Neurosurgery;  Laterality: Bilateral;  . PULSE GENERATOR IMPLANT Bilateral 12/13/2013   Procedure: Bilateral implantable  pulse generator placement;  Surgeon: Erline Levine, MD;  Location: [place] NEURO ORS;  Service: Neurosurgery;  Laterality: Bilateral;  Bilateral implantable pulse generator placement  . skin cancer removed     from ears,   12 lft arm  rt leg 15  . SUBTHALAMIC STIMULATOR BATTERY REPLACEMENT Bilateral 07/14/2017   Procedure: BILATERAL IMPLANTED PULSE  GENERATOR CHANGE FOR DEEP BRAIN STIMULATOR;  Surgeon: Erline Levine, MD;  Location: [place];  Service: Neurosurgery;  Laterality: Bilateral;  . SUBTHALAMIC STIMULATOR INSERTION Bilateral 12/06/2013   Procedure: SUBTHALAMIC STIMULATOR INSERTION;  Surgeon: Erline Levine, MD;  Location: [place] NEURO ORS;  Service: Neurosurgery;  Laterality: Bilateral;  Bilateral deep brain stimulator placement       Home Medications    Prior to Admission medications   Medication Sig Start Date End Date Taking? Authorizing Provider  aspirin EC 81 MG tablet Take 1 tablet (81 mg total) by mouth daily. Swallow whole. 11/18/19  Yes Loel Dubonnet, NP  carbidopa-levodopa (SINEMET IR) 25-100 MG tablet 2 tablets in AM, 1.5 tablets in afternoon, and 1.5 tablets in the evening   Yes [provider]  cholecalciferol (VITAMIN D) 1000 UNITS tablet Take 1,000 Units by mouth daily.   Yes [provider]  escitalopram (LEXAPRO) 10 MG tablet TAKE 1 TABLET DAILY 07/30/19  Yes Tonia Ghent, MD  Melatonin 10 MG TABS Take 10 mg by mouth at bedtime.   Yes [provider]  memantine (NAMENDA) 10 MG tablet TAKE 1 TABLET TWICE A DAY 02/11/19  Yes Tonia Ghent, MD  omeprazole (PRILOSEC) 20 MG capsule TAKE 1 CAPSULE DAILY 06/18/19  Yes Tonia Ghent, MD  rivastigmine (EXELON) 9.5 mg/24hr 9.5 mg daily. 09/22/19  Yes [provider]  traMADol-acetaminophen (ULTRACET) 37.5-325 MG tablet Take 1 tablet by mouth in the morning and at bedtime. 10/30/19  Yes [provider]  hydrALAZINE (APRESOLINE) 25 MG tablet Take 1 tablet (25 mg total) by mouth as needed (For elevated blood pressure. Take if you have 2 consecutive readings that are one hour apart with systolic BP >854). 10/07/19 01/05/20  Loel Dubonnet, NP    Family History Family History  Problem Relation Age of Onset  . Lung cancer Father   . Colon cancer Neg Hx   . Prostate cancer Neg Hx     Social History Social History   Tobacco Use   . Smoking status: Never Smoker  . Smokeless tobacco: Former Network engineer  . Vaping Use: Never used  Substance Use Topics  . Alcohol use: Yes    Alcohol/week: 0.0 standard drinks    Comment: occasional (twice per month)  . Drug use: No     Allergies   Clonazepam, Morphine, Morphine and related, and Prednisone   Review of Systems Review of Systems  Constitutional: Negative for chills and fever.  HENT: Negative for ear pain and sore throat.   Eyes: Negative for pain and visual disturbance.  Respiratory: Negative for cough and shortness of breath.   Cardiovascular: Negative for chest pain and palpitations.  Gastrointestinal: Negative for abdominal pain and vomiting.  Genitourinary: Positive for hematuria. Negative for dysuria, flank pain and frequency.  Musculoskeletal: Negative for arthralgias and back pain.  Skin: Negative for color change and rash.  Neurological: Negative for seizures and syncope.  All other systems reviewed and are negative.    Physical Exam Triage Vital Signs ED Triage Vitals  Enc Vitals Group     BP 01/24/20 1213 112/69  Pulse Rate 01/24/20 1213 (!) 52     Resp --      Temp 01/24/20 1213 98 F (36.7 C)     Temp Source 01/24/20 1213 Oral     SpO2 01/24/20 1213 98 %     Weight --      Height --      Head Circumference --      Peak Flow --      Pain Score 01/24/20 1217 0     Pain Loc --      Pain Edu? --      Excl. in [place]? --    No data found.  Updated Vital Signs BP 112/69   Pulse (!) 52   Temp 98 F (36.7 C) (Oral)   SpO2 98%   Visual Acuity Right Eye Distance:   Left Eye Distance:   Bilateral Distance:    Right Eye Near:   Left Eye Near:    Bilateral Near:     Physical Exam Vitals and nursing note reviewed.  Constitutional:      General: He is not in acute distress.    Appearance: He is well-developed.  HENT:     Head: Normocephalic and atraumatic.     Mouth/Throat:     Mouth: Mucous membranes are moist.  Eyes:       Conjunctiva/sclera: Conjunctivae normal.  Cardiovascular:     Rate and Rhythm: Normal rate and regular rhythm.     Heart sounds: No murmur heard.   Pulmonary:     Effort: Pulmonary effort is normal. No respiratory distress.     Breath sounds: Normal breath sounds.  Abdominal:     Palpations: Abdomen is soft.     Tenderness: There is no abdominal tenderness. There is no right CVA tenderness, left CVA tenderness, guarding or rebound.  Musculoskeletal:     Cervical back: Neck supple.  Skin:    General: Skin is warm and dry.  Neurological:     Mental Status: He is alert. Mental status is at baseline.  Psychiatric:        Mood and Affect: Mood normal.        Behavior: Behavior normal.      UC Treatments / Results  Labs (all labs ordered are listed, but only abnormal results are displayed) Labs Reviewed  POCT URINALYSIS DIP (MANUAL ENTRY) - Abnormal; Notable for the following components:      Result Value   Color, UA straw (*)    Ketones, POC UA trace (5) (*)    Blood, UA trace-lysed (*)    All other components within normal limits  URINE CULTURE    EKG   Radiology No results found.  Procedures Procedures (including critical care time)  Medications Ordered in UC Medications - No data to display  Initial Impression / Assessment and Plan / UC Course  I have reviewed the triage vital signs and the nursing notes.  Pertinent labs & imaging results that were available during my care of the patient were reviewed by me and considered in my medical decision making (see chart for details).   Worried well.  Patient report of orange-colored urine.  Urine does not show signs of infection.  Culture pending.  Discussed with patient and his wife that we will call them if it shows signs of infection requiring treatment.  Instructed them to follow-up with PCP if his symptoms are not improving.  Patient and his wife agree to plan of care.  Final Clinical Impressions(s) /  UC Diagnoses   Final diagnoses:  Orange-colored urine  Worried well     Discharge Instructions     Your urine does not show signs of infection today we will send it for urine culture to confirm this and call you if anything comes back positive requiring treatment.    Follow up with your primary care provider if your symptoms are not improving.       ED Prescriptions    None     PDMP not reviewed this encounter.   Sharion Balloon, NP 01/24/20 1319

## 2020-01-25 LAB — URINE CULTURE

## 2020-01-27 ENCOUNTER — Ambulatory Visit: Payer: Medicare PPO

## 2020-01-27 ENCOUNTER — Ambulatory Visit: Payer: Medicare PPO | Admitting: Occupational Therapy

## 2020-01-29 ENCOUNTER — Ambulatory Visit: Payer: Medicare PPO

## 2020-01-29 ENCOUNTER — Ambulatory Visit: Payer: Medicare PPO | Admitting: Occupational Therapy

## 2020-01-29 ENCOUNTER — Encounter: Payer: Self-pay | Admitting: Occupational Therapy

## 2020-01-29 ENCOUNTER — Other Ambulatory Visit: Payer: Self-pay

## 2020-01-29 DIAGNOSIS — M6281 Muscle weakness (generalized): Secondary | ICD-10-CM | POA: Diagnosis not present

## 2020-01-29 DIAGNOSIS — M25551 Pain in right hip: Secondary | ICD-10-CM | POA: Diagnosis not present

## 2020-01-29 DIAGNOSIS — G20 Parkinson's disease: Secondary | ICD-10-CM

## 2020-01-29 DIAGNOSIS — R2689 Other abnormalities of gait and mobility: Secondary | ICD-10-CM | POA: Diagnosis not present

## 2020-01-29 DIAGNOSIS — R2681 Unsteadiness on feet: Secondary | ICD-10-CM | POA: Diagnosis not present

## 2020-01-29 DIAGNOSIS — M545 Low back pain, unspecified: Secondary | ICD-10-CM | POA: Diagnosis not present

## 2020-01-29 DIAGNOSIS — R278 Other lack of coordination: Secondary | ICD-10-CM | POA: Diagnosis not present

## 2020-01-29 DIAGNOSIS — R296 Repeated falls: Secondary | ICD-10-CM | POA: Diagnosis not present

## 2020-01-29 DIAGNOSIS — G8929 Other chronic pain: Secondary | ICD-10-CM | POA: Diagnosis not present

## 2020-01-29 NOTE — Therapy (Signed)
[place] MAIN [facility] SERVICES [address] [place], [place], [address] Phone: [phone]   Fax:  [phone]  Physical Therapy Treatment  Patient Details  Name: [name] MRN: [id number] Date of Birth: [birth year] Referring Provider (PT): Sharolyn Douglas MD    Encounter Date: 01/29/2020   PT End of Session - 01/29/20 0926    Visit Number 34    Number of Visits 40    Date for PT Re-Evaluation 02/03/20    Authorization Type 4/10 PN 9/22    Authorization Time Period FOTO- PT    PT Start Time 0917    PT Stop Time 0959    PT Time Calculation (min) 42 min    Equipment Utilized During Treatment Gait belt    Activity Tolerance Patient tolerated treatment well;Patient limited by pain    Behavior During Therapy [facility] for tasks assessed/performed           Past Medical History:  Diagnosis Date  . Arthritis   . Bradycardia   . Cancer [facility]) 2013   skin cancer  . Depression    ptsd  . Dysrhythmia    chronic slow heart rate  . GERD (gastroesophageal reflux disease)   . Headache(784.0)    tension headaches non recent  . History of chicken pox   . History of kidney stones    passed  . Hypertension    treated with HCTZ  . Parkinson's disease ([place])    dx'ed 15 years ago  . PTSD (post-traumatic stress disorder)   . Shortness of breath dyspnea   . Sleep apnea    doesn't use C-pap  . Varicose veins     Past Surgical History:  Procedure Laterality Date  . CHOLECYSTECTOMY N/A 10/22/2014   Procedure: LAPAROSCOPIC CHOLECYSTECTOMY WITH INTRAOPERATIVE CHOLANGIOGRAM;  Surgeon: Dia Crawford III, MD;  Location: [facility] ORS;  Service: General;  Laterality: N/A;  . cyst removed      from lip as a child  . INTRAMEDULLARY (IM) NAIL INTERTROCHANTERIC N/A 02/18/2019   Procedure: INTRAMEDULLARY (IM) NAIL INTERTROCHANTRIC, RIGHT,;  Surgeon: Thornton Park, MD;  Location: [facility] ORS;  Service: Orthopedics;  Laterality: N/A;  . LUMBAR LAMINECTOMY/DECOMPRESSION  MICRODISCECTOMY Bilateral 12/14/2012   Procedure: Bilateral lumbar three-four, four-five decompressive laminotomy/foraminotomy;  Surgeon: Charlie Pitter, MD;  Location: [place] NEURO ORS;  Service: Neurosurgery;  Laterality: Bilateral;  . PULSE GENERATOR IMPLANT Bilateral 12/13/2013   Procedure: Bilateral implantable pulse generator placement;  Surgeon: Erline Levine, MD;  Location: [place] NEURO ORS;  Service: Neurosurgery;  Laterality: Bilateral;  Bilateral implantable pulse generator placement  . skin cancer removed     from ears,   12 lft arm  rt leg 15  . SUBTHALAMIC STIMULATOR BATTERY REPLACEMENT Bilateral 07/14/2017   Procedure: BILATERAL IMPLANTED PULSE GENERATOR CHANGE FOR DEEP BRAIN STIMULATOR;  Surgeon: Erline Levine, MD;  Location: [place];  Service: Neurosurgery;  Laterality: Bilateral;  . SUBTHALAMIC STIMULATOR INSERTION Bilateral 12/06/2013   Procedure: SUBTHALAMIC STIMULATOR INSERTION;  Surgeon: Erline Levine, MD;  Location: [place] NEURO ORS;  Service: Neurosurgery;  Laterality: Bilateral;  Bilateral deep brain stimulator placement    There were no vitals filed for this visit.   Subjective Assessment - 01/29/20 0924    Subjective Patient reports missing last session due to not feeling well. Reports no pain just full body "bleh" feeling. Has not been doing exercises due to not feeling well. Went to ED due to discoloration of urine last Friday with no conclusive results    Patient  is accompained by: Family member    Pertinent History [name] is a 69 y.o. male with a hx of HTN, bradycardia, Parkinson's s/p deep brain stimulator 11/2013 , HLD, depression, possible seizure disorder, arthritis, depression/PTSD, dysrhythmia, GERD, sleep apnea. Patient was seen by this therapist in years past. Patient had a fall around Halloween of 2020 and admitted for ORIF on 02/18/19, was discharged to SNF and had [place] around Christmas. Had home health therapy for a month and a half. After that tried to go to Outpatient  Therapy for 3 sessions at [facility] and was not pleased due to how busy and crowded the location was. Fell last week in bathroom, didn't take walker in, falls about 2x month. Is very weak in RLE. Patient has a new lift chair and U drive walking. Has a caregiver 4 days a week. Has additional order for radiculopathy, lumbar region. PMH of lumbar laminectomy/decompression microdiscectomy (L3-4, 4-5)  In 2014.  Lumbar 4-5 cage +rod (2016).    Limitations Walking;Standing;Sitting;House hold activities;Lifting    How long can you sit comfortably? not limited with a backrest, without a back rest 3 minutes    How long can you stand comfortably? 3-5 minutes with holding on.    Patient Stated Goals pain reduction of R hip. strength of legs, dance with wife. push lawnmower.    Currently in Pain? No/denies    Pain Onset More than a month ago            Patient reports missing last session due to not feeling well. Reports no pain just full body "bleh" feeling. Has not been doing exercises due to not feeling well. Went to ED due to discoloration of urine last Friday with no conclusive results  Vitals at start of session: 146/60    Supine:   GTB abduction in hooklying 20x; cues for arc of motion GTB marching hooklying 20x each LE, core activation cues  bridge, min A for stabilization in central portion of table 20x   sidelying:  clamshells GTB 20x each side reverse clamshells 15x  Hip extension (mod A ) 15x each LE  Seated:  RTB around ankles alternating ER/IR 20x each LE  RTB hamstring curl 15x each LE ; cues for returning back to starting position Sit to stand 10x from lowered plinth table with focus on gluteal squeeze and knee extension, Sit to stand 10x with weighted ball press (2000 gr); with focus on gluteal squeeze and knee extension,    Pt educated throughout session about proper posture and technique with exercises. Improved exercise technique, movement at target joints, use of target  muscles after min to mod verbal, visual, tactile cues.                   PT Education - 01/29/20 0925    Education provided Yes    Education Details exercise technique, body mechanics    Person(s) Educated Patient    Methods Explanation;Demonstration;Tactile cues;Verbal cues    Comprehension Verbalized understanding;Returned demonstration;Verbal cues required;Tactile cues required            PT Short Term Goals - 01/08/20 1024      PT SHORT TERM GOAL #1   Title Pt will perform HEP with family's/aide's supervision, for improved balance, transfers, and gait.    Baseline 5/5: HEP given 6/28: HEP compliant 8/23: aide helps with HEP    Time 4    Period Weeks    Status Achieved    Target Date  09/19/19             PT Long Term Goals - 01/08/20 1021      PT LONG TERM GOAL #1   Title Patient (> 14 years old) will complete five times sit to stand test in < 15 seconds without UE support without LOB indicating an increased LE strength and improved balance.    Baseline 5/5: 19 seconds one posterior LOB 6/28: 12.32 no UE support    Time 8    Period Weeks    Status Achieved      PT LONG TERM GOAL #2   Title Patient will increase 10 meter walk test to >1.70m/s as to improve gait speed for better community ambulation and to reduce fall risk.    Baseline 4/4: 0.71 m/s with U step walker 6/28: 0.917 m/s with U step rollator 8/23: >1.0 m/s with rollator    Time 8    Period Weeks    Status Achieved      PT LONG TERM GOAL #3   Title Patient will increase Berg Balance score by > 6 points (32/56) to demonstrate decreased fall risk during functional activities.    Baseline 5/5: 26/56 6/28: 36/56    Time 8    Period Weeks    Status Achieved      PT LONG TERM GOAL #4   Title Patient will increase FOTO score to equal to or greater than 31/100  to demonstrate statistically significant improvement in mobility and quality of life.    Baseline 5/5: 12/100 6/28: 38% 8/23: 11.7 9/22:  45%    Time 8    Period Weeks    Status Achieved      PT LONG TERM GOAL #5   Title Patient will increase six minute walk test distance to >1000 for progression to community ambulator and improve gait ability    Baseline 6/28: 750 ft with U step rollator 8/23: 775 w rolling walker, increased crouch pattern of ambulation 9/22: deferred due to pain    Time 8    Period Weeks    Status Partially Met    Target Date 02/03/20      PT LONG TERM GOAL #6   Title Patient will increase Berg Balance score by > 6 points (42/56) to demonstrate decreased fall risk during functional activities.    Baseline 6/28: 36/56 8/23: 42/56 9/22 44/56    Time 8    Period Weeks    Status Partially Met    Target Date 02/03/20      PT LONG TERM GOAL #7   Title Patient will report a worst pain of 3/10 on VAS in low back to improve tolerance with ADLs and reduced symptoms with activities.    Baseline 8/9: 9/10 8/23: 7/10 pain 9/22: 7/10    Time 8    Period Weeks    Status Partially Met    Target Date 02/03/20      PT LONG TERM GOAL #8   Title Patient will reduce modified Oswestry score to <20 as to demonstrate minimal disability with ADLs including improved sleeping tolerance, walking/sitting tolerance etc for better mobility with ADLs.    Baseline 8/9: 64% 8/23: 66% 9/22:44%    Time 8    Period Weeks    Status Partially Met    Target Date 02/03/20                 Plan - 01/29/20 1134    Clinical Impression Statement Patient requires frequent  rest breaks and tactile cueing for task sequencing due to fatigue. Initially is limited in interaction with therapist but became more alert as session progressed and able to perform higher function tasks. Patient would benefit from skilled physical therapy to increase strength, stability, balance, and decrease pain to improve quality of life    Personal Factors and Comorbidities Age;Comorbidity 3+;Past/Current Experience;Time since onset of  injury/illness/exacerbation;Transportation    Comorbidities athritis, bradycardia, cancer, depression, dysrhythmia, GERD, HTN, Parkinson's, PTSD, SOB, sleep apnea    Examination-Activity Limitations Bathing;Bed Mobility;Caring for Others;Bend;Dressing;Lift;Locomotion Level;Reach Overhead;Sit;Squat;Stairs;Stand;Toileting;Transfers    Examination-Participation Restrictions Church;Cleaning;Community Activity;Interpersonal Relationship;Shop;Volunteer;Yard Work;Other    Stability/Clinical Decision Making Stable/Uncomplicated    Rehab Potential Fair    PT Frequency 2x / week    PT Duration 8 weeks    PT Treatment/Interventions ADLs/Self Care Home Management;Aquatic Therapy;Cryotherapy;Electrical Stimulation;Iontophoresis 4mg /ml Dexamethasone;Moist Heat;Ultrasound;Contrast Bath;DME Instruction;Gait training;Stair training;Functional mobility training;Therapeutic activities;Therapeutic exercise;Balance training;Neuromuscular re-education;Patient/family education;Manual techniques;Wheelchair mobility training;Energy conservation;Passive range of motion;Traction;Orthotic Fit/Training;Dry needling;Vestibular;Visual/perceptual remediation/compensation;Taping;Canalith Repostioning    PT Next Visit Plan core and pain relief    PT Home Exercise Plan [place] and Agree with Plan of Care Patient;Family member/caregiver    Family Member Consulted wife           Patient will benefit from skilled therapeutic intervention in order to improve the following deficits and impairments:  Abnormal gait, Decreased balance, Decreased coordination, Decreased mobility, Impaired tone, Postural dysfunction, Decreased strength, Decreased safety awareness, Improper body mechanics, Impaired flexibility, Decreased activity tolerance, Decreased endurance, Decreased knowledge of precautions, Difficulty walking, Pain, Cardiopulmonary status limiting activity, Decreased range of motion, Impaired perceived functional  ability  Visit Diagnosis: Muscle weakness (generalized)  Other lack of coordination  Parkinson's disease (HCC)  Unsteadiness on feet  Pain in right hip     Problem List Patient Active Problem List   Diagnosis Date Noted  . E. coli bacteremia 02/23/2019  . Dysphagia 02/23/2019  . Aspiration pneumonia ([place]) 02/21/2019  . Closed right hip fracture ([place]) 02/16/2019  . Rib pain on right side 09/25/2018  . Skin sore 12/01/2017  . Hematoma 10/18/2017  . Fall at home 10/18/2017  . 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 Radicular pain in right arm 10/09/2016  . GERD (gastroesophageal reflux disease) 07/31/2016  . Trochanteric bursitis 03/03/2016  . Left hand pain 10/30/2015  . Fracture, finger, multiple sites 10/30/2015  . Colon cancer screening 07/23/2015  . Healthcare maintenance 07/23/2015  . Gout 02/18/2015  . Depression 01/06/2015  . Irritation of eyelid 08/20/2014  . Dupuytren's contracture 08/20/2014  . Cough 07/21/2014  . Lumbar stenosis with neurogenic claudication 06/13/2014  . Preop cardiovascular exam 05/28/2014  . S/P deep brain stimulator placement 05/08/2014  . Joint pain 01/22/2014  . Medicare annual wellness visit, initial 01/19/2014  . Advance care planning 01/19/2014  . Parkinson's disease ([place]) 12/13/2013  . PTSD (post-traumatic stress disorder) 06/13/2013  . Erectile dysfunction 06/13/2013  . HLD (hyperlipidemia) 06/13/2013  . Hip pain 06/10/2013  . Pain in joint, shoulder region 06/10/2013  . Right leg swelling 06/03/2013  . Essential hypertension 06/03/2013  . Bradycardia by electrocardiogram 06/03/2013  . Obstructive sleep apnea 03/12/2013  . Spinal stenosis, lumbar region, with neurogenic claudication 12/14/2012   Janna Arch, PT, DPT   01/29/2020, 11:35 AM  [place] [address] [place], [place], [address] Phone: [phone]   Fax:  [phone]  Name: SUNDEEP DESTIN MRN:  [id number] Date of Birth: [birth year]

## 2020-01-29 NOTE — Therapy (Signed)
[place] MAIN [facility] SERVICES [address] [place], [place], [address] Phone: [phone]   Fax:  [phone]  Occupational Therapy Treatment  Patient Details  Name: [name] MRN: [id number] Date of Birth: 20-Dec-1950 No data recorded  Encounter Date: 01/29/2020   OT End of Session - 01/29/20 1102    Visit Number 9    Number of Visits 24    Date for OT Re-Evaluation 03/06/20    OT Start Time 1002    OT Stop Time 1045    OT Time Calculation (min) 43 min    Activity Tolerance Patient tolerated treatment well    Behavior During Therapy [facility] for tasks assessed/performed           Past Medical History:  Diagnosis Date  . Arthritis   . Bradycardia   . Cancer [facility]) 2013   skin cancer  . Depression    ptsd  . Dysrhythmia    chronic slow heart rate  . GERD (gastroesophageal reflux disease)   . Headache(784.0)    tension headaches non recent  . History of chicken pox   . History of kidney stones    passed  . Hypertension    treated with HCTZ  . Parkinson's disease ([place])    dx'ed 15 years ago  . PTSD (post-traumatic stress disorder)   . Shortness of breath dyspnea   . Sleep apnea    doesn't use C-pap  . Varicose veins     Past Surgical History:  Procedure Laterality Date  . CHOLECYSTECTOMY N/A 10/22/2014   Procedure: LAPAROSCOPIC CHOLECYSTECTOMY WITH INTRAOPERATIVE CHOLANGIOGRAM;  Surgeon: Dia Crawford III, MD;  Location: [facility] ORS;  Service: General;  Laterality: N/A;  . cyst removed      from lip as a child  . INTRAMEDULLARY (IM) NAIL INTERTROCHANTERIC N/A 02/18/2019   Procedure: INTRAMEDULLARY (IM) NAIL INTERTROCHANTRIC, RIGHT,;  Surgeon: Thornton Park, MD;  Location: [facility] ORS;  Service: Orthopedics;  Laterality: N/A;  . LUMBAR LAMINECTOMY/DECOMPRESSION MICRODISCECTOMY Bilateral 12/14/2012   Procedure: Bilateral lumbar three-four, four-five decompressive laminotomy/foraminotomy;  Surgeon: Charlie Pitter, MD;  Location: [place]  NEURO ORS;  Service: Neurosurgery;  Laterality: Bilateral;  . PULSE GENERATOR IMPLANT Bilateral 12/13/2013   Procedure: Bilateral implantable pulse generator placement;  Surgeon: Erline Levine, MD;  Location: [place] NEURO ORS;  Service: Neurosurgery;  Laterality: Bilateral;  Bilateral implantable pulse generator placement  . skin cancer removed     from ears,   12 lft arm  rt leg 15  . SUBTHALAMIC STIMULATOR BATTERY REPLACEMENT Bilateral 07/14/2017   Procedure: BILATERAL IMPLANTED PULSE GENERATOR CHANGE FOR DEEP BRAIN STIMULATOR;  Surgeon: Erline Levine, MD;  Location: [place];  Service: Neurosurgery;  Laterality: Bilateral;  . SUBTHALAMIC STIMULATOR INSERTION Bilateral 12/06/2013   Procedure: SUBTHALAMIC STIMULATOR INSERTION;  Surgeon: Erline Levine, MD;  Location: [place] NEURO ORS;  Service: Neurosurgery;  Laterality: Bilateral;  Bilateral deep brain stimulator placement    There were no vitals filed for this visit.   Subjective Assessment - 01/29/20 1101    Subjective  Pt. reports feeling "blah" today, and theat he almost didn't make it to the appointment today.    Patient is accompanied by: Family member    Pertinent History Pt. is a 69 yo male who has a history of Parkinson's Disease. Pt. has a Deep Brain Stimulator in place. Pt. has a history of Low Back Pain with 2 back surgeries in August 2014, and February 2016. Pt. has a history of multiple frequent falls, unsure  of how many falls in the last 6 months.    Currently in Pain? No/denies    Pain Descriptors / Indicators Aching;Throbbing    Pain Type Chronic pain          OT Treatment  Therapeutic Exercise:  Pt. performed 3.5# dowel ex. for UE strengthening secondary to weakness. Bilateral shoulder flexion, chest press, and circular patterns were performed1 set of 10- 20 reps each. Pt. Performed4# dumbbell ex. For elbow flexion, extension. 3# for forearm supination/pronation, wrist flexion/extension.Pt. requires rest breaks and verbal cues,  tactile cues, and cues for visual demonstrationfor proper technique.Pt. required increased time to complete.  Self-care:  Pt. worked on the skills needed for cutting food. Pt. was able to firmly hold, use a fork, and knife efficiently to be able to cut through 3 levels of resistive theraputty (pink, green, and blue)   Pt. was able to efficiently handle a fork, and knife while cutting through 3 levels of resistive theraputty without difficulty to simulate cutting meat. Pt. tolerated increased dowel weight today with a lower number of there. ex. sets. Pt. presented with improved task initiation today.Pt.requiredincreased timeverbal, and visual cues for proper there. ex. form, and pace during the exercises. Pt. continues to work on improving UE strength, [facility] skill in order to work towards improving, and maximizing independence with ADLs, and IADLs.                               OT Education - 01/29/20 1102    Education provided Yes    Person(s) Educated Patient;Spouse    Comprehension Verbalized understanding               OT Long Term Goals - 12/13/19 2046      OT LONG TERM GOAL #1   Title Pt. will improve Bilateral UE strength by 1 mm grade to assist with ADLs, and IADLs    Baseline BUE strength 4/5 overall at eval    Time 12    Period Weeks    Status New    Target Date 03/06/20      OT LONG TERM GOAL #2   Title Pt. will improve grip strength by 10# to open jars and containers.    Baseline difficulty at eval    Time 12    Period Weeks    Status New    Target Date 03/06/20      OT LONG TERM GOAL #3   Title Patient will complete lower body dressing with modified independence.    Baseline min to mod assist at eval    Time 12    Period Weeks    Status New    Target Date 03/06/20      OT LONG TERM GOAL #4   Title Patient to demonstrate the ability to vacuum flooring at home with use of modified techniques with supervision.    Baseline  difficulty at eval    Time 12    Period Weeks    Status New    Target Date 03/06/20      OT LONG TERM GOAL #5   Title Patient will demonstrate the ability to retrieve a snack and transport it to the living room with modified independence.    Baseline unable at eval    Time 12    Period Weeks    Status New    Target Date 03/06/20      OT LONG TERM GOAL #6  Title Patient will improve hand function to be able to perform cutting of food with modified independence.    Baseline wife helps to cut food when needed.    Time 8    Period Weeks    Status New    Target Date 02/05/20      OT LONG TERM GOAL #7   Title Pt. will write a paragraph efficiently with 90% legibility.    Baseline decreased legibility and poor hand endurance for writing more than 1 sentence.    Time 8    Period Weeks    Status New    Target Date 02/05/20      OT LONG TERM GOAL  #10   TITLE Pt will don and doff pants with supervision    Baseline min assist to manage pants    Time 12    Period Weeks    Status New    Target Date 03/06/20                 Plan - 01/29/20 1102    Clinical Impression Statement Pt. was able to efficiently handle a fork, and knife while cutting through 3 levels of resistive theraputty without difficulty to simulate cutting meat. Pt. tolerated increased dowel weight today with a lower number of there. ex. sets. Pt. presented with improved task initiation today.Pt.requiredincreased timeverbal, and visual cues for proper there. ex. form, and pace during the exercises. Pt. continues to work on improving UE strength, [facility] skill in order to work towards improving, and maximizing independence with ADLs, and IADLs.   OT Occupational Profile and History Detailed Assessment- Review of Records and additional review of physical, cognitive, psychosocial history related to current functional performance    Occupational performance deficits (Please refer to evaluation for details):  ADL's;IADL's;Leisure    Body Structure / Function / Physical Skills ADL;Coordination;Endurance;GMC;UE functional use;Balance;Decreased knowledge of use of DME;IADL;Pain;Dexterity;FMC;Strength;Mobility;ROM    Cognitive Skills Attention;Problem Solve;Memory    Psychosocial Skills Coping Strategies;Environmental  Adaptations;Habits;Routines and Behaviors    Rehab Potential Good    Clinical Decision Making Limited treatment options, no task modification necessary    Comorbidities Affecting Occupational Performance: Presence of comorbidities impacting occupational performance    Comorbidities impacting occupational performance description: risk for falls, slow to respond and process information at times, safety    Modification or Assistance to Complete Evaluation  No modification of tasks or assist necessary to complete eval    OT Frequency 2x / week    OT Duration 12 weeks    OT Treatment/Interventions Self-care/ADL training;Therapeutic exercise;DME and/or AE instruction;Energy conservation;Neuromuscular education;Patient/family education;Therapeutic activities;Moist Heat;Functional Mobility Training;Cognitive remediation/compensation    Consulted and Agree with Plan of Care Patient           Patient will benefit from skilled therapeutic intervention in order to improve the following deficits and impairments:   Body Structure / Function / Physical Skills: ADL, Coordination, Endurance, GMC, UE functional use, Balance, Decreased knowledge of use of DME, IADL, Pain, Dexterity, FMC, Strength, Mobility, ROM Cognitive Skills: Attention, Problem Solve, Memory Psychosocial Skills: Coping Strategies, Environmental  Adaptations, Habits, Routines and Behaviors   Visit Diagnosis: Muscle weakness (generalized)    Problem List Patient Active Problem List   Diagnosis Date Noted  . E. coli bacteremia 02/23/2019  . Dysphagia 02/23/2019  . Aspiration pneumonia ([place]) 02/21/2019  . Closed right hip  fracture ([place]) 02/16/2019  . Rib pain on right side 09/25/2018  . Skin sore 12/01/2017  . Hematoma 10/18/2017  . Fall at  home 10/18/2017  . 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 Radicular pain in right arm 10/09/2016  . GERD (gastroesophageal reflux disease) 07/31/2016  . Trochanteric bursitis 03/03/2016  . Left hand pain 10/30/2015  . Fracture, finger, multiple sites 10/30/2015  . Colon cancer screening 07/23/2015  . Healthcare maintenance 07/23/2015  . Gout 02/18/2015  . Depression 01/06/2015  . Irritation of eyelid 08/20/2014  . Dupuytren's contracture 08/20/2014  . Cough 07/21/2014  . Lumbar stenosis with neurogenic claudication 06/13/2014  . Preop cardiovascular exam 05/28/2014  . S/P deep brain stimulator placement 05/08/2014  . Joint pain 01/22/2014  . Medicare annual wellness visit, initial 01/19/2014  . Advance care planning 01/19/2014  . Parkinson's disease ([place]) 12/13/2013  . PTSD (post-traumatic stress disorder) 06/13/2013  . Erectile dysfunction 06/13/2013  . HLD (hyperlipidemia) 06/13/2013  . Hip pain 06/10/2013  . Pain in joint, shoulder region 06/10/2013  . Right leg swelling 06/03/2013  . Essential hypertension 06/03/2013  . Bradycardia by electrocardiogram 06/03/2013  . Obstructive sleep apnea 03/12/2013  . Spinal stenosis, lumbar region, with neurogenic claudication 12/14/2012    Harrel Carina, MS, OTR/L 01/29/2020, 11:22 AM  [place] MAIN [facility] SERVICES [place], [place], [address] Phone: [phone]   Fax:  [phone]  Name: [name] MRN: [id number] Date of Birth: February 03, 1951

## 2020-01-30 DIAGNOSIS — Z9689 Presence of other specified functional implants: Secondary | ICD-10-CM | POA: Diagnosis not present

## 2020-01-30 DIAGNOSIS — G20 Parkinson's disease: Secondary | ICD-10-CM | POA: Diagnosis not present

## 2020-01-30 DIAGNOSIS — Z79899 Other long term (current) drug therapy: Secondary | ICD-10-CM | POA: Diagnosis not present

## 2020-01-30 DIAGNOSIS — M549 Dorsalgia, unspecified: Secondary | ICD-10-CM | POA: Diagnosis not present

## 2020-01-30 DIAGNOSIS — R4689 Other symptoms and signs involving appearance and behavior: Secondary | ICD-10-CM | POA: Diagnosis not present

## 2020-01-30 DIAGNOSIS — R9401 Abnormal electroencephalogram [EEG]: Secondary | ICD-10-CM | POA: Diagnosis not present

## 2020-01-30 NOTE — Telephone Encounter (Signed)
[name] called back saying he saw neurologist virtually this am. They suggested he get a repeat UA. Asking if we Dr Damita Dunnings would order that for him here. Please advise at [phone].

## 2020-01-30 NOTE — Addendum Note (Signed)
Addended by: Tonia Ghent on: 01/30/2020 01:56 PM   Modules accepted: Orders

## 2020-01-30 NOTE — Telephone Encounter (Signed)
Spoke with wife notified her of Dr. Josefine Class comments. Wife said pt has been more lethargic recently and last week they though they saw a little blood in his urine and his urine culture that was done last week came back inconclusive. Wife will drop off a urine sample today or tomorrow. (will not add to the lab schedule until we receive pt's urine sample)

## 2020-01-30 NOTE — Telephone Encounter (Addendum)
I put in the order.  They can drop off a sample if needed.  Please see what symptoms he is having and let me know.  Thanks.

## 2020-01-31 ENCOUNTER — Other Ambulatory Visit: Payer: Self-pay | Admitting: Family Medicine

## 2020-01-31 DIAGNOSIS — R8290 Unspecified abnormal findings in urine: Secondary | ICD-10-CM | POA: Diagnosis not present

## 2020-01-31 LAB — POC URINALSYSI DIPSTICK (AUTOMATED)
Bilirubin, UA: NEGATIVE
Glucose, UA: NEGATIVE
Ketones, UA: NEGATIVE
Leukocytes, UA: NEGATIVE
Nitrite, UA: NEGATIVE
Protein, UA: POSITIVE — AB
Spec Grav, UA: 1.025 (ref 1.010–1.025)
Urobilinogen, UA: 0.2 E.U./dL
pH, UA: 6.5 (ref 5.0–8.0)

## 2020-01-31 NOTE — Telephone Encounter (Signed)
I added on ucx to the orders, for when they drop it off.  If lethargy continues/escalates or if fever, then we need to recheck him in clinic.  I'll await the urine results in the meantime.  Thanks.

## 2020-01-31 NOTE — Telephone Encounter (Signed)
Wife advised.  Culture sent to lab.

## 2020-01-31 NOTE — Addendum Note (Signed)
Addended by: Josetta Huddle on: 01/31/2020 12:52 PM   Modules accepted: Orders

## 2020-02-02 LAB — URINE CULTURE
MICRO NUMBER:: 11077623
SPECIMEN QUALITY:: ADEQUATE

## 2020-02-03 ENCOUNTER — Ambulatory Visit: Payer: Medicare PPO | Admitting: Psychology

## 2020-02-03 ENCOUNTER — Encounter: Payer: Self-pay | Admitting: Occupational Therapy

## 2020-02-03 ENCOUNTER — Ambulatory Visit: Payer: Medicare PPO | Admitting: Occupational Therapy

## 2020-02-03 ENCOUNTER — Ambulatory Visit: Payer: Medicare PPO

## 2020-02-03 ENCOUNTER — Other Ambulatory Visit: Payer: Self-pay

## 2020-02-03 DIAGNOSIS — M25551 Pain in right hip: Secondary | ICD-10-CM

## 2020-02-03 DIAGNOSIS — R2689 Other abnormalities of gait and mobility: Secondary | ICD-10-CM

## 2020-02-03 DIAGNOSIS — R262 Difficulty in walking, not elsewhere classified: Secondary | ICD-10-CM

## 2020-02-03 DIAGNOSIS — M6281 Muscle weakness (generalized): Secondary | ICD-10-CM | POA: Diagnosis not present

## 2020-02-03 DIAGNOSIS — G20 Parkinson's disease: Secondary | ICD-10-CM | POA: Diagnosis not present

## 2020-02-03 DIAGNOSIS — R278 Other lack of coordination: Secondary | ICD-10-CM

## 2020-02-03 DIAGNOSIS — G8929 Other chronic pain: Secondary | ICD-10-CM

## 2020-02-03 DIAGNOSIS — R2681 Unsteadiness on feet: Secondary | ICD-10-CM | POA: Diagnosis not present

## 2020-02-03 DIAGNOSIS — R296 Repeated falls: Secondary | ICD-10-CM | POA: Diagnosis not present

## 2020-02-03 DIAGNOSIS — R29818 Other symptoms and signs involving the nervous system: Secondary | ICD-10-CM

## 2020-02-03 DIAGNOSIS — M545 Low back pain, unspecified: Secondary | ICD-10-CM | POA: Diagnosis not present

## 2020-02-03 DIAGNOSIS — R293 Abnormal posture: Secondary | ICD-10-CM

## 2020-02-03 NOTE — Therapy (Signed)
[place] MAIN [facility] SERVICES [address] [place], [place], [address] Phone: [phone]   Fax:  [phone]  Occupational Therapy Treatment/ Progress Note   Dates of Service: 12/11/19 - 02/03/2020 - 03/06/20   Patient Details  Name: [name] MRN: [id number] Date of Birth: May 15, 1950 No data recorded  Encounter Date: 02/03/2020   OT End of Session - 02/03/20 1252    Visit Number 10    Number of Visits 24    Date for OT Re-Evaluation 03/06/20    OT Start Time 1000    OT Stop Time 1045    OT Time Calculation (min) 45 min    Activity Tolerance Patient tolerated treatment well    Behavior During Therapy [facility] for tasks assessed/performed           Past Medical History:  Diagnosis Date   Arthritis    Bradycardia    Cancer ([place]) 2013   skin cancer   Depression    ptsd   Dysrhythmia    chronic slow heart rate   GERD (gastroesophageal reflux disease)    Headache(784.0)    tension headaches non recent   History of chicken pox    History of kidney stones    passed   Hypertension    treated with HCTZ   Parkinson's disease ([place])    dx'ed 15 years ago   PTSD (post-traumatic stress disorder)    Shortness of breath dyspnea    Sleep apnea    doesn't use C-pap   Varicose veins     Past Surgical History:  Procedure Laterality Date   CHOLECYSTECTOMY N/A 10/22/2014   Procedure: LAPAROSCOPIC CHOLECYSTECTOMY WITH INTRAOPERATIVE CHOLANGIOGRAM;  Surgeon: Dia Crawford III, MD;  Location: [facility] ORS;  Service: General;  Laterality: N/A;   cyst removed      from lip as a child   INTRAMEDULLARY (IM) NAIL INTERTROCHANTERIC N/A 02/18/2019   Procedure: INTRAMEDULLARY (IM) NAIL INTERTROCHANTRIC, RIGHT,;  Surgeon: Thornton Park, MD;  Location: [facility] ORS;  Service: Orthopedics;  Laterality: N/A;   LUMBAR LAMINECTOMY/DECOMPRESSION MICRODISCECTOMY Bilateral 12/14/2012   Procedure: Bilateral lumbar three-four, four-five  decompressive laminotomy/foraminotomy;  Surgeon: Charlie Pitter, MD;  Location: [place] NEURO ORS;  Service: Neurosurgery;  Laterality: Bilateral;   PULSE GENERATOR IMPLANT Bilateral 12/13/2013   Procedure: Bilateral implantable pulse generator placement;  Surgeon: Erline Levine, MD;  Location: [place] NEURO ORS;  Service: Neurosurgery;  Laterality: Bilateral;  Bilateral implantable pulse generator placement   skin cancer removed     from ears,   12 lft arm  rt leg 15   SUBTHALAMIC STIMULATOR BATTERY REPLACEMENT Bilateral 07/14/2017   Procedure: BILATERAL IMPLANTED PULSE GENERATOR CHANGE FOR DEEP BRAIN STIMULATOR;  Surgeon: Erline Levine, MD;  Location: [place];  Service: Neurosurgery;  Laterality: Bilateral;   SUBTHALAMIC STIMULATOR INSERTION Bilateral 12/06/2013   Procedure: SUBTHALAMIC STIMULATOR INSERTION;  Surgeon: Erline Levine, MD;  Location: [place] NEURO ORS;  Service: Neurosurgery;  Laterality: Bilateral;  Bilateral deep brain stimulator placement    There were no vitals filed for this visit.   Subjective Assessment - 02/03/20 1246    Subjective  Pt reports working hard at PT this morning and feeling tired.    Patient is accompanied by: Family member    Pertinent History Pt. is a 69 yo male who has a history of Parkinson's Disease. Pt. has a Deep Brain Stimulator in place. Pt. has a history of Low Back Pain with 2 back surgeries in August 2014, and February 2016. Pt.  has a history of multiple frequent falls, unsure of how many falls in the last 6 months.    Limitations Frequent falls, limited motor control, and [place].     Patient Stated Goals Pt would like to be more independent daily.    Currently in Pain? Yes    Pain Score 3     Pain Location Back    Pain Orientation Lower    Pain Descriptors / Indicators Aching    Pain Type Chronic pain                        OT Treatments/Exercises (OP) - 02/03/20 1013      Exercises   Exercises Shoulder;Elbow;Wrist      Shoulder Exercises:  Seated   Elevation Weight (lbs) 5    Extension Weight (lbs) 5    Retraction Weight (lbs) 4    Protraction Weight (lbs) 4    External Rotation Weight (lbs) 4    Internal Rotation Weight (lbs) 4    Flexion Weight (lbs) 5          THEREX:   Pt performed dowel exercises for UE strengthening secondary to weakness 1 set of B shoulder flexion at 4# - pt reported confidence in trying heavier weight. Pt performed 5# seated dowel exercises for 1 set x 20 reps each - B shoulder flexion, chest press, and elbow flexion/extension were performed. Seated dumbbell exercises for 1 set x 10 reps each - B shoulder external/internal rotation, wrist extension, and scapular protraction/retraction. Pt required VCs and visual demonstration of proper form t/o exercises. Pt worked on bilateral grip strengthening using 23.4# handgrip to grasp pegs from peg board and place in container at table level x1 round for LUE/RUE and at shoulder height x1 round RUE only. Pt required VCs to incorporate LUE to stabilize peg board. Pt worked on using a 3pt pinch, and pt pinch grasp to place/remove x15 clothespins on thin dowel at shoulder height.          OT Education - 02/03/20 1251    Education provided Yes    Education Details goals, POC    Person(s) Educated Patient;Spouse    Methods Explanation    Comprehension Verbalized understanding               OT Long Term Goals - 02/03/20 1313      OT LONG TERM GOAL #1   Title Pt. will improve Bilateral UE strength by 1 mm grade to assist with ADLs, and IADLs    Baseline BUE strength 4/5 overall at eval    Time 12    Period Weeks    Status On-going      OT LONG TERM GOAL #2   Title Pt. will improve grip strength by 10# to open jars and containers.    Baseline difficulty at eval    Time 12    Period Weeks    Status On-going      OT LONG TERM GOAL #3   Title Patient will complete lower body dressing with modified independence.    Baseline min to mod assist at  eval; sock aid supervision, MOD A doff R sock, IND doff L    Time 12    Period Weeks    Status Partially Met      OT LONG TERM GOAL #4   Title Patient to demonstrate the ability to vacuum flooring at home with use of modified techniques with supervision.    Baseline  difficulty at eval    Time 12    Period Weeks    Status On-going      OT LONG TERM GOAL #5   Title Patient will demonstrate the ability to retrieve a snack and transport it to the living room with modified independence.    Baseline unable at eval    Time 12    Period Weeks    Status On-going      OT LONG TERM GOAL #6   Title Patient will improve hand function to be able to perform cutting of food with modified independence.    Baseline wife helps to cut food when needed; VCs cutting theraputty    Time 8    Period Weeks    Status Partially Met      OT LONG TERM GOAL #7   Title Pt. will write a paragraph efficiently with 90% legibility.    Baseline decreased legibility and poor hand endurance for writing more than 1 sentence.    Time 8    Period Weeks    Status On-going      OT LONG TERM GOAL  #10   TITLE Pt will don and doff pants with supervision    Baseline min assist to manage pants    Time 12    Period Weeks    Status On-going                 Plan - 02/03/20 1337    Clinical Impression Statement Pt has partially met LBD goals and food cutting goals, other goals are on-going at this date. Pt tolerated increased dowel weight today. Pt continues to require increased verbal and visual cues for proper therex form, breaks t/o session, and to integrate LUE as stabilizer. Pt continues to work on improving UE strength, [facility] skill in order to work towards improving independence with ADLs and IADLs.    OT Occupational Profile and History Detailed Assessment- Review of Records and additional review of physical, cognitive, psychosocial history related to current functional performance    Occupational performance  deficits (Please refer to evaluation for details): ADL's;IADL's;Leisure    Body Structure / Function / Physical Skills ADL;Coordination;Endurance;GMC;UE functional use;Balance;Decreased knowledge of use of DME;IADL;Pain;Dexterity;FMC;Strength;Mobility;ROM    Cognitive Skills Attention;Problem Solve;Memory    Psychosocial Skills Coping Strategies;Environmental  Adaptations;Habits;Routines and Behaviors    Rehab Potential Good    Clinical Decision Making Limited treatment options, no task modification necessary    Comorbidities Affecting Occupational Performance: Presence of comorbidities impacting occupational performance    Comorbidities impacting occupational performance description: risk for falls, slow to respond and process information at times, safety    Modification or Assistance to Complete Evaluation  No modification of tasks or assist necessary to complete eval    OT Frequency 2x / week    OT Duration 12 weeks    OT Treatment/Interventions Self-care/ADL training;Therapeutic exercise;DME and/or AE instruction;Energy conservation;Neuromuscular education;Patient/family education;Therapeutic activities;Moist Heat;Functional Mobility Training;Cognitive remediation/compensation    Consulted and Agree with Plan of Care Patient           Patient will benefit from skilled therapeutic intervention in order to improve the following deficits and impairments:   Body Structure / Function / Physical Skills: ADL, Coordination, Endurance, GMC, UE functional use, Balance, Decreased knowledge of use of DME, IADL, Pain, Dexterity, FMC, Strength, Mobility, ROM Cognitive Skills: Attention, Problem Solve, Memory Psychosocial Skills: Coping Strategies, Environmental  Adaptations, Habits, Routines and Behaviors   Visit Diagnosis: Muscle weakness (generalized)  Other lack of coordination  Problem List Patient Active Problem List   Diagnosis Date Noted   E. coli bacteremia 02/23/2019   Dysphagia  02/23/2019   Aspiration pneumonia ([place]) 02/21/2019   Closed right hip fracture ([place]) 02/16/2019   Rib pain on right side 09/25/2018   Skin sore 12/01/2017   Hematoma 10/18/2017   Fall at home 10/18/2017   REM behavioral disorder 11/02/2016   Radicular pain in right arm 10/09/2016   GERD (gastroesophageal reflux disease) 07/31/2016   Trochanteric bursitis 03/03/2016   Left hand pain 10/30/2015   Fracture, finger, multiple sites 10/30/2015   Colon cancer screening 07/23/2015   Healthcare maintenance 07/23/2015   Gout 02/18/2015   Depression 01/06/2015   Irritation of eyelid 08/20/2014   Dupuytren's contracture 08/20/2014   Cough 07/21/2014   Lumbar stenosis with neurogenic claudication 06/13/2014   Preop cardiovascular exam 05/28/2014   S/P deep brain stimulator placement 05/08/2014   Joint pain 01/22/2014   Medicare annual wellness visit, initial 01/19/2014   Advance care planning 01/19/2014   Parkinson's disease ([place]) 12/13/2013   PTSD (post-traumatic stress disorder) 06/13/2013   Erectile dysfunction 06/13/2013   HLD (hyperlipidemia) 06/13/2013   Hip pain 06/10/2013   Pain in joint, shoulder region 06/10/2013   Right leg swelling 06/03/2013   Essential hypertension 06/03/2013   Bradycardia by electrocardiogram 06/03/2013   Obstructive sleep apnea 03/12/2013   Spinal stenosis, lumbar region, with neurogenic claudication 12/14/2012    Dessie Coma, M.S. OTR/L  02/03/20, 1:44 PM  ascom [phone]  [place] MAIN [facility] SERVICES [place], [place], [address] Phone: [phone]   Fax:  [phone]  Name: [name] MRN: [id number] Date of Birth: 1951-04-13

## 2020-02-03 NOTE — Therapy (Signed)
[place] MAIN [facility] SERVICES [address] [place], [place], [address] Phone: [phone]   Fax:  [phone]  Physical Therapy Treatment  Patient Details  Name: [name] MRN: [id number] Date of Birth: [birth year] Referring Provider (PT): Sharolyn Douglas MD    Encounter Date: 02/03/2020   PT End of Session - 02/03/20 0958    Visit Number 35    Number of Visits 40    Date for PT Re-Evaluation 02/03/20    PT Start Time 0931    PT Stop Time 1011    PT Time Calculation (min) 40 min    Equipment Utilized During Treatment Gait belt    Activity Tolerance Patient tolerated treatment well;Patient limited by pain    Behavior During Therapy [facility] for tasks assessed/performed           Past Medical History:  Diagnosis Date  . Arthritis   . Bradycardia   . Cancer [facility]) 2013   skin cancer  . Depression    ptsd  . Dysrhythmia    chronic slow heart rate  . GERD (gastroesophageal reflux disease)   . Headache(784.0)    tension headaches non recent  . History of chicken pox   . History of kidney stones    passed  . Hypertension    treated with HCTZ  . Parkinson's disease ([place])    dx'ed 15 years ago  . PTSD (post-traumatic stress disorder)   . Shortness of breath dyspnea   . Sleep apnea    doesn't use C-pap  . Varicose veins     Past Surgical History:  Procedure Laterality Date  . CHOLECYSTECTOMY N/A 10/22/2014   Procedure: LAPAROSCOPIC CHOLECYSTECTOMY WITH INTRAOPERATIVE CHOLANGIOGRAM;  Surgeon: Dia Crawford III, MD;  Location: [facility] ORS;  Service: General;  Laterality: N/A;  . cyst removed      from lip as a child  . INTRAMEDULLARY (IM) NAIL INTERTROCHANTERIC N/A 02/18/2019   Procedure: INTRAMEDULLARY (IM) NAIL INTERTROCHANTRIC, RIGHT,;  Surgeon: Thornton Park, MD;  Location: [facility] ORS;  Service: Orthopedics;  Laterality: N/A;  . LUMBAR LAMINECTOMY/DECOMPRESSION MICRODISCECTOMY Bilateral 12/14/2012   Procedure: Bilateral lumbar  three-four, four-five decompressive laminotomy/foraminotomy;  Surgeon: Charlie Pitter, MD;  Location: [place] NEURO ORS;  Service: Neurosurgery;  Laterality: Bilateral;  . PULSE GENERATOR IMPLANT Bilateral 12/13/2013   Procedure: Bilateral implantable pulse generator placement;  Surgeon: Erline Levine, MD;  Location: [place] NEURO ORS;  Service: Neurosurgery;  Laterality: Bilateral;  Bilateral implantable pulse generator placement  . skin cancer removed     from ears,   12 lft arm  rt leg 15  . SUBTHALAMIC STIMULATOR BATTERY REPLACEMENT Bilateral 07/14/2017   Procedure: BILATERAL IMPLANTED PULSE GENERATOR CHANGE FOR DEEP BRAIN STIMULATOR;  Surgeon: Erline Levine, MD;  Location: [place];  Service: Neurosurgery;  Laterality: Bilateral;  . SUBTHALAMIC STIMULATOR INSERTION Bilateral 12/06/2013   Procedure: SUBTHALAMIC STIMULATOR INSERTION;  Surgeon: Erline Levine, MD;  Location: [place] NEURO ORS;  Service: Neurosurgery;  Laterality: Bilateral;  Bilateral deep brain stimulator placement    There were no vitals filed for this visit.   Subjective Assessment - 02/03/20 0950    Subjective Pt doing well today, no updates from weekend. No falls since prior visit, no medical updates. Pt going to [place] today after session.    Pertinent History [name] is a 69 y.o. male with a hx of HTN, bradycardia, Parkinson's s/p deep brain stimulator 11/2013 , HLD, depression, possible seizure disorder, arthritis, depression/PTSD, dysrhythmia, GERD, sleep apnea. Patient was  seen by this therapist in years past. Patient had a fall around Halloween of 2020 and admitted for ORIF on 02/18/19, was discharged to SNF and had [place] around Christmas. Had home health therapy for a month and a half. After that tried to go to Outpatient Therapy for 3 sessions at [facility] and was not pleased due to how busy and crowded the location was. Fell last week in bathroom, didn't take walker in, falls about 2x month. Is very weak in RLE. Patient has a new lift chair  and U drive walking. Has a caregiver 4 days a week. Has additional order for radiculopathy, lumbar region. PMH of lumbar laminectomy/decompression microdiscectomy (L3-4, 4-5)  In 2014.  Lumbar 4-5 cage +rod (2016).    Currently in Pain? Yes    Pain Score 3     Pain Location Back    Pain Orientation Lower              [facility] PT Assessment - 02/03/20 0001      6 Minute Walk- Baseline   6 Minute Walk- Baseline yes      6 minute walk test results    Aerobic Endurance Distance Walked 830   4WW, minGuard assist , 4WW          Hooklying:  GTB abduction in hooklying 20x; cues for arc of motion GTB marching hooklying 20x each LE, core activation cues   Seated: RTB knees BLE ER 1x20  RTB ankles (ball at knees) BLE IR 1x20 STS from Plinth, hands free 2x10, variable height, has some fatigue at end of 2nd set  Overground gait with RTB, GTB facilitated postural cues 2x192ft  *over neck, under axillae, bands crossed behind back with posterior tension pulling patient into thoracic flexion  Pt requires minGuard assist for AMB actiivty in session. 2 LOB during STS requiring modA for recovery, or permissive posterior collapse into sitting position.     PT Short Term Goals - 01/08/20 1024      PT SHORT TERM GOAL #1   Title Pt will perform HEP with family's/aide's supervision, for improved balance, transfers, and gait.    Baseline 5/5: HEP given 6/28: HEP compliant 8/23: aide helps with HEP    Time 4    Period Weeks    Status Achieved    Target Date 09/19/19             PT Long Term Goals - 02/03/20 1020      PT LONG TERM GOAL #5   Title Patient will increase six minute walk test distance to >1000 for progression to community ambulator and improve gait ability    Baseline 6/28: 750 ft with U step rollator 8/23: 775 w rolling walker, increased crouch pattern of ambulation 9/22: deferred due to pain; 10/18: 863ft    Time 8    Period Weeks    Status On-going    Target Date 02/03/20                  Plan - 02/03/20 0959    Clinical Impression Statement Conitnued with POC. Pt conitnue to improve in strength. 6MWT shows consistent performance in gait, slight improvement in distance, RLE only begins to show fatigue after 4.5 minutes. Pt showed improved movement amplitude in resistance training activity. Pt continues to require intermittent minA for LOB recovery due to delayed and decreased trunk movement patterns.    Personal Factors and Comorbidities Age;Comorbidity 3+;Past/Current Experience;Time since onset of injury/illness/exacerbation;Transportation    Comorbidities athritis,  bradycardia, cancer, depression, dysrhythmia, GERD, HTN, Parkinson's, PTSD, SOB, sleep apnea    Examination-Activity Limitations Bathing;Bed Mobility;Caring for Others;Bend;Dressing;Lift;Locomotion Level;Reach Overhead;Sit;Squat;Stairs;Stand;Toileting;Transfers    Examination-Participation Restrictions Church;Cleaning;Community Activity;Interpersonal Relationship;Shop;Volunteer;Yard Work;Other    Stability/Clinical Decision Making Stable/Uncomplicated    Rehab Potential Fair    PT Frequency 1x / week    PT Duration 8 weeks    PT Treatment/Interventions ADLs/Self Care Home Management;Aquatic Therapy;Cryotherapy;Electrical Stimulation;Iontophoresis 4mg /ml Dexamethasone;Moist Heat;Ultrasound;Contrast Bath;DME Instruction;Gait training;Stair training;Functional mobility training;Therapeutic activities;Therapeutic exercise;Balance training;Neuromuscular re-education;Patient/family education;Manual techniques;Wheelchair mobility training;Energy conservation;Passive range of motion;Traction;Orthotic Fit/Training;Dry needling;Vestibular;Visual/perceptual remediation/compensation;Taping;Canalith Repostioning    PT Next Visit Plan core and pain relief    PT Home Exercise Plan [place] and Agree with Plan of Care Patient;Family member/caregiver    Family Member Consulted wife            Patient will benefit from skilled therapeutic intervention in order to improve the following deficits and impairments:  Abnormal gait, Decreased balance, Decreased coordination, Decreased mobility, Impaired tone, Postural dysfunction, Decreased strength, Decreased safety awareness, Improper body mechanics, Impaired flexibility, Decreased activity tolerance, Decreased endurance, Decreased knowledge of precautions, Difficulty walking, Pain, Cardiopulmonary status limiting activity, Decreased range of motion, Impaired perceived functional ability  Visit Diagnosis: Muscle weakness (generalized)  Other lack of coordination  Parkinson's disease (HCC)  Unsteadiness on feet  Pain in right hip  Other abnormalities of gait and mobility  Chronic low back pain, unspecified back pain laterality, unspecified whether sciatica present  Repeated falls  Difficulty walking  Abnormal posture  Other symptoms and signs involving the nervous system     Problem List Patient Active Problem List   Diagnosis Date Noted  . E. coli bacteremia 02/23/2019  . Dysphagia 02/23/2019  . Aspiration pneumonia ([place]) 02/21/2019  . Closed right hip fracture ([place]) 02/16/2019  . Rib pain on right side 09/25/2018  . Skin sore 12/01/2017  . Hematoma 10/18/2017  . Fall at home 10/18/2017  . 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 Radicular pain in right arm 10/09/2016  . GERD (gastroesophageal reflux disease) 07/31/2016  . Trochanteric bursitis 03/03/2016  . Left hand pain 10/30/2015  . Fracture, finger, multiple sites 10/30/2015  . Colon cancer screening 07/23/2015  . Healthcare maintenance 07/23/2015  . Gout 02/18/2015  . Depression 01/06/2015  . Irritation of eyelid 08/20/2014  . Dupuytren's contracture 08/20/2014  . Cough 07/21/2014  . Lumbar stenosis with neurogenic claudication 06/13/2014  . Preop cardiovascular exam 05/28/2014  . S/P deep brain stimulator placement 05/08/2014  . Joint pain  01/22/2014  . Medicare annual wellness visit, initial 01/19/2014  . Advance care planning 01/19/2014  . Parkinson's disease ([place]) 12/13/2013  . PTSD (post-traumatic stress disorder) 06/13/2013  . Erectile dysfunction 06/13/2013  . HLD (hyperlipidemia) 06/13/2013  . Hip pain 06/10/2013  . Pain in joint, shoulder region 06/10/2013  . Right leg swelling 06/03/2013  . Essential hypertension 06/03/2013  . Bradycardia by electrocardiogram 06/03/2013  . Obstructive sleep apnea 03/12/2013  . Spinal stenosis, lumbar region, with neurogenic claudication 12/14/2012   10:26 AM, 02/03/20 Etta Grandchild, PT, DPT Physical Therapist - [facility]  [phone] ([address])    [place] 02/03/2020, 10:23 AM  [place] MAIN [facility] SERVICES [address] [place], [place], [address] Phone: [phone]   Fax:  [phone]  Name: [name] MRN: [id number] Date of Birth: [birth year]

## 2020-02-04 ENCOUNTER — Ambulatory Visit: Payer: Medicare PPO | Admitting: Cardiovascular Disease

## 2020-02-05 ENCOUNTER — Encounter: Payer: Self-pay | Admitting: Occupational Therapy

## 2020-02-05 ENCOUNTER — Ambulatory Visit: Payer: Medicare PPO

## 2020-02-05 ENCOUNTER — Ambulatory Visit: Payer: Medicare PPO | Admitting: Occupational Therapy

## 2020-02-05 ENCOUNTER — Other Ambulatory Visit: Payer: Self-pay

## 2020-02-05 DIAGNOSIS — R278 Other lack of coordination: Secondary | ICD-10-CM | POA: Diagnosis not present

## 2020-02-05 DIAGNOSIS — G8929 Other chronic pain: Secondary | ICD-10-CM | POA: Diagnosis not present

## 2020-02-05 DIAGNOSIS — R2689 Other abnormalities of gait and mobility: Secondary | ICD-10-CM | POA: Diagnosis not present

## 2020-02-05 DIAGNOSIS — G20 Parkinson's disease: Secondary | ICD-10-CM | POA: Diagnosis not present

## 2020-02-05 DIAGNOSIS — M6281 Muscle weakness (generalized): Secondary | ICD-10-CM | POA: Diagnosis not present

## 2020-02-05 DIAGNOSIS — R296 Repeated falls: Secondary | ICD-10-CM | POA: Diagnosis not present

## 2020-02-05 DIAGNOSIS — R2681 Unsteadiness on feet: Secondary | ICD-10-CM | POA: Diagnosis not present

## 2020-02-05 DIAGNOSIS — M25551 Pain in right hip: Secondary | ICD-10-CM | POA: Diagnosis not present

## 2020-02-05 DIAGNOSIS — M545 Low back pain, unspecified: Secondary | ICD-10-CM | POA: Diagnosis not present

## 2020-02-05 NOTE — Therapy (Signed)
[place] MAIN [facility] SERVICES [address] [place], [place], [address] Phone: [phone]   Fax:  [phone]  Occupational Therapy Treatment  Patient Details  Name: [name] MRN: [id number] Date of Birth: 12-13-1950 No data recorded  Encounter Date: 02/05/2020   OT End of Session - 02/05/20 1739    Visit Number 11    Number of Visits 24    Date for OT Re-Evaluation 03/06/20    Authorization Type reporting period starting 10/202/2021    OT Start Time 1000    OT Stop Time 1045    OT Time Calculation (min) 45 min    Activity Tolerance Patient tolerated treatment well    Behavior During Therapy [facility] for tasks assessed/performed           Past Medical History:  Diagnosis Date  . Arthritis   . Bradycardia   . Cancer [facility]) 2013   skin cancer  . Depression    ptsd  . Dysrhythmia    chronic slow heart rate  . GERD (gastroesophageal reflux disease)   . Headache(784.0)    tension headaches non recent  . History of chicken pox   . History of kidney stones    passed  . Hypertension    treated with HCTZ  . Parkinson's disease ([place])    dx'ed 15 years ago  . PTSD (post-traumatic stress disorder)   . Shortness of breath dyspnea   . Sleep apnea    doesn't use C-pap  . Varicose veins     Past Surgical History:  Procedure Laterality Date  . CHOLECYSTECTOMY N/A 10/22/2014   Procedure: LAPAROSCOPIC CHOLECYSTECTOMY WITH INTRAOPERATIVE CHOLANGIOGRAM;  Surgeon: Dia Crawford III, MD;  Location: [facility] ORS;  Service: General;  Laterality: N/A;  . cyst removed      from lip as a child  . INTRAMEDULLARY (IM) NAIL INTERTROCHANTERIC N/A 02/18/2019   Procedure: INTRAMEDULLARY (IM) NAIL INTERTROCHANTRIC, RIGHT,;  Surgeon: Thornton Park, MD;  Location: [facility] ORS;  Service: Orthopedics;  Laterality: N/A;  . LUMBAR LAMINECTOMY/DECOMPRESSION MICRODISCECTOMY Bilateral 12/14/2012   Procedure: Bilateral lumbar three-four, four-five decompressive  laminotomy/foraminotomy;  Surgeon: Charlie Pitter, MD;  Location: [place] NEURO ORS;  Service: Neurosurgery;  Laterality: Bilateral;  . PULSE GENERATOR IMPLANT Bilateral 12/13/2013   Procedure: Bilateral implantable pulse generator placement;  Surgeon: Erline Levine, MD;  Location: [place] NEURO ORS;  Service: Neurosurgery;  Laterality: Bilateral;  Bilateral implantable pulse generator placement  . skin cancer removed     from ears,   12 lft arm  rt leg 15  . SUBTHALAMIC STIMULATOR BATTERY REPLACEMENT Bilateral 07/14/2017   Procedure: BILATERAL IMPLANTED PULSE GENERATOR CHANGE FOR DEEP BRAIN STIMULATOR;  Surgeon: Erline Levine, MD;  Location: [place];  Service: Neurosurgery;  Laterality: Bilateral;  . SUBTHALAMIC STIMULATOR INSERTION Bilateral 12/06/2013   Procedure: SUBTHALAMIC STIMULATOR INSERTION;  Surgeon: Erline Levine, MD;  Location: [place] NEURO ORS;  Service: Neurosurgery;  Laterality: Bilateral;  Bilateral deep brain stimulator placement    There were no vitals filed for this visit.   Subjective Assessment - 02/05/20 1005    Subjective  Pt. reports that he feels like his right knee may buckle    Patient is accompanied by: Family member    Pertinent History Pt. is a 69 yo male who has a history of Parkinson's Disease. Pt. has a Deep Brain Stimulator in place. Pt. has a history of Low Back Pain with 2 back surgeries in August 2014, and February 2016. Pt. has a history of  multiple frequent falls, unsure of how many falls in the last 6 months.    Patient Stated Goals Pt would like to be more independent daily.    Currently in Pain? Yes    Pain Score 6     Pain Location Back    Pain Orientation Lower    Pain Descriptors / Indicators Aching              [facility] OT Assessment - 02/05/20 1013      Coordination   Right 9 Hole Peg Test 49    Left 9 Hole Peg Test 54      Strength   Overall Strength Comments 4+/5 overall strength in bilateral shoulders, and elbows.      Hand Function   Right Hand Grip  (lbs) 47    Right Hand Lateral Pinch 18 lbs    Right Hand 3 Point Pinch 15 lbs    Left Hand Grip (lbs) 55    Left Hand Lateral Pinch 18 lbs    Left 3 point pinch 18 lbs          OT Treatment  Therapeutic Exercise:  Pt. performed5# dowel ex. for UE strengthening secondary to weakness. Bilateral shoulder flexion, chest press,and circular patterns were performed1 set of 20- 30 reps each.   Self-care:  Pt. education was provided about A/E use for LE ADLs. Pt. required CGA to doff his right sock with a dressing stick with minimal cues. Pt. required Supervision with verbal cues, and visual demonstration to use the sockaide to donn socks with cues for donning the sock onto the sockaide. Pt. was independent with donning, and doffing bilateral shoes.   Pt. tolerated increased dowel weight today with a higher number of reps. Pt. Continues to present with improvedtask initiationtoday.Pt.requiredincreased time,verbal, and visual cues for proper there. ex. form, and pace during the exercises. Pt. continues to work on improving UE strength, [facility] skills, and reviewing A/E use in order to improve, and maximize independence with ADLs, and IADL tasks.                  OT Education - 02/05/20 1005    Education provided Yes    Education Details UE strength    Person(s) Educated Patient;Spouse    Methods Explanation               OT Long Term Goals - 02/03/20 1313      OT LONG TERM GOAL #1   Title Pt. will improve Bilateral UE strength by 1 mm grade to assist with ADLs, and IADLs    Baseline BUE strength 4/5 overall at eval    Time 12    Period Weeks    Status On-going      OT LONG TERM GOAL #2   Title Pt. will improve grip strength by 10# to open jars and containers.    Baseline difficulty at eval    Time 12    Period Weeks    Status On-going      OT LONG TERM GOAL #3   Title Patient will complete lower body dressing with modified independence.     Baseline min to mod assist at eval; sock aid supervision, MOD A doff R sock, IND doff L    Time 12    Period Weeks    Status Partially Met      OT LONG TERM GOAL #4   Title Patient to demonstrate the ability to vacuum flooring at home with use of  modified techniques with supervision.    Baseline difficulty at eval    Time 12    Period Weeks    Status On-going      OT LONG TERM GOAL #5   Title Patient will demonstrate the ability to retrieve a snack and transport it to the living room with modified independence.    Baseline unable at eval    Time 12    Period Weeks    Status On-going      OT LONG TERM GOAL #6   Title Patient will improve hand function to be able to perform cutting of food with modified independence.    Baseline wife helps to cut food when needed; VCs cutting theraputty    Time 8    Period Weeks    Status Partially Met      OT LONG TERM GOAL #7   Title Pt. will write a paragraph efficiently with 90% legibility.    Baseline decreased legibility and poor hand endurance for writing more than 1 sentence.    Time 8    Period Weeks    Status On-going      OT LONG TERM GOAL  #10   TITLE Pt will don and doff pants with supervision    Baseline min assist to manage pants    Time 12    Period Weeks    Status On-going                 Plan - 02/05/20 1059    Clinical Impression Statement Pt. tolerated increased dowel weight today with a higher number of reps. Pt. Continues to present with improvedtask initiationtoday.Pt.requiredincreased time,verbal, and visual cues for proper there. ex. form, and pace during the exercises. Pt. continues to work on improving UE strength, [facility] skills, and reviewing A/E use in order to improve, and maximize independence with ADLs, and IADL tasks.   OT Occupational Profile and History Detailed Assessment- Review of Records and additional review of physical, cognitive, psychosocial history related to current functional  performance    Occupational performance deficits (Please refer to evaluation for details): ADL's;IADL's;Leisure    Body Structure / Function / Physical Skills ADL;Coordination;Endurance;GMC;UE functional use;Balance;Decreased knowledge of use of DME;IADL;Pain;Dexterity;FMC;Strength;Mobility;ROM    Cognitive Skills Attention;Problem Solve;Memory    Psychosocial Skills Coping Strategies;Environmental  Adaptations;Habits;Routines and Behaviors    Rehab Potential Good    Clinical Decision Making Limited treatment options, no task modification necessary    Comorbidities Affecting Occupational Performance: Presence of comorbidities impacting occupational performance    Comorbidities impacting occupational performance description: risk for falls, slow to respond and process information at times, safety    Modification or Assistance to Complete Evaluation  No modification of tasks or assist necessary to complete eval    OT Frequency 2x / week    OT Duration 12 weeks    OT Treatment/Interventions Self-care/ADL training;Therapeutic exercise;DME and/or AE instruction;Energy conservation;Neuromuscular education;Patient/family education;Therapeutic activities;Moist Heat;Functional Mobility Training;Cognitive remediation/compensation    Consulted and Agree with Plan of Care Patient           Patient will benefit from skilled therapeutic intervention in order to improve the following deficits and impairments:   Body Structure / Function / Physical Skills: ADL, Coordination, Endurance, GMC, UE functional use, Balance, Decreased knowledge of use of DME, IADL, Pain, Dexterity, FMC, Strength, Mobility, ROM Cognitive Skills: Attention, Problem Solve, Memory Psychosocial Skills: Coping Strategies, Environmental  Adaptations, Habits, Routines and Behaviors   Visit Diagnosis: Muscle weakness (generalized)  Other lack of coordination  Problem List Patient Active Problem List   Diagnosis Date Noted  . E.  coli bacteremia 02/23/2019  . Dysphagia 02/23/2019  . Aspiration pneumonia ([place]) 02/21/2019  . Closed right hip fracture ([place]) 02/16/2019  . Rib pain on right side 09/25/2018  . Skin sore 12/01/2017  . Hematoma 10/18/2017  . Fall at home 10/18/2017  . 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 Radicular pain in right arm 10/09/2016  . GERD (gastroesophageal reflux disease) 07/31/2016  . Trochanteric bursitis 03/03/2016  . Left hand pain 10/30/2015  . Fracture, finger, multiple sites 10/30/2015  . Colon cancer screening 07/23/2015  . Healthcare maintenance 07/23/2015  . Gout 02/18/2015  . Depression 01/06/2015  . Irritation of eyelid 08/20/2014  . Dupuytren's contracture 08/20/2014  . Cough 07/21/2014  . Lumbar stenosis with neurogenic claudication 06/13/2014  . Preop cardiovascular exam 05/28/2014  . S/P deep brain stimulator placement 05/08/2014  . Joint pain 01/22/2014  . Medicare annual wellness visit, initial 01/19/2014  . Advance care planning 01/19/2014  . Parkinson's disease ([place]) 12/13/2013  . PTSD (post-traumatic stress disorder) 06/13/2013  . Erectile dysfunction 06/13/2013  . HLD (hyperlipidemia) 06/13/2013  . Hip pain 06/10/2013  . Pain in joint, shoulder region 06/10/2013  . Right leg swelling 06/03/2013  . Essential hypertension 06/03/2013  . Bradycardia by electrocardiogram 06/03/2013  . Obstructive sleep apnea 03/12/2013  . Spinal stenosis, lumbar region, with neurogenic claudication 12/14/2012    Harrel Carina, MS, OTR/L 02/05/2020, 5:40 PM  [place] MAIN [facility] SERVICES [address] [place], [place], [address] Phone: [phone]   Fax:  [phone]  Name: [name] MRN: [id number] Date of Birth: Mar 04, 1951

## 2020-02-05 NOTE — Therapy (Addendum)
[place] MAIN [facility] SERVICES [address] [place], [place], [address] Phone: [phone]   Fax:  [phone]  Physical Therapy Treatment/RECERT  Patient Details  Name: [name] MRN: [id number] Date of Birth: Apr 03, 1951 Referring Provider (PT): Sharolyn Douglas MD    Encounter Date: 02/05/2020   PT End of Session - 02/05/20 1336    Visit Number 36    Number of Visits 52    Date for PT Re-Evaluation 04/01/20    Authorization Type 6/10 PN 9/22    PT Start Time 0915    PT Stop Time 0958    PT Time Calculation (min) 43 min    Equipment Utilized During Treatment Gait belt    Activity Tolerance Patient tolerated treatment well;Patient limited by pain    Behavior During Therapy [facility] for tasks assessed/performed           Past Medical History:  Diagnosis Date  . Arthritis   . Bradycardia   . Cancer [facility]) 2013   skin cancer  . Depression    ptsd  . Dysrhythmia    chronic slow heart rate  . GERD (gastroesophageal reflux disease)   . Headache(784.0)    tension headaches non recent  . History of chicken pox   . History of kidney stones    passed  . Hypertension    treated with HCTZ  . Parkinson's disease ([place])    dx'ed 15 years ago  . PTSD (post-traumatic stress disorder)   . Shortness of breath dyspnea   . Sleep apnea    doesn't use C-pap  . Varicose veins     Past Surgical History:  Procedure Laterality Date  . CHOLECYSTECTOMY N/A 10/22/2014   Procedure: LAPAROSCOPIC CHOLECYSTECTOMY WITH INTRAOPERATIVE CHOLANGIOGRAM;  Surgeon: Dia Crawford III, MD;  Location: [facility] ORS;  Service: General;  Laterality: N/A;  . cyst removed      from lip as a child  . INTRAMEDULLARY (IM) NAIL INTERTROCHANTERIC N/A 02/18/2019   Procedure: INTRAMEDULLARY (IM) NAIL INTERTROCHANTRIC, RIGHT,;  Surgeon: Thornton Park, MD;  Location: [facility] ORS;  Service: Orthopedics;  Laterality: N/A;  . LUMBAR LAMINECTOMY/DECOMPRESSION MICRODISCECTOMY Bilateral  12/14/2012   Procedure: Bilateral lumbar three-four, four-five decompressive laminotomy/foraminotomy;  Surgeon: Charlie Pitter, MD;  Location: [place] NEURO ORS;  Service: Neurosurgery;  Laterality: Bilateral;  . PULSE GENERATOR IMPLANT Bilateral 12/13/2013   Procedure: Bilateral implantable pulse generator placement;  Surgeon: Erline Levine, MD;  Location: [place] NEURO ORS;  Service: Neurosurgery;  Laterality: Bilateral;  Bilateral implantable pulse generator placement  . skin cancer removed     from ears,   12 lft arm  rt leg 15  . SUBTHALAMIC STIMULATOR BATTERY REPLACEMENT Bilateral 07/14/2017   Procedure: BILATERAL IMPLANTED PULSE GENERATOR CHANGE FOR DEEP BRAIN STIMULATOR;  Surgeon: Erline Levine, MD;  Location: [place];  Service: Neurosurgery;  Laterality: Bilateral;  . SUBTHALAMIC STIMULATOR INSERTION Bilateral 12/06/2013   Procedure: SUBTHALAMIC STIMULATOR INSERTION;  Surgeon: Erline Levine, MD;  Location: [place] NEURO ORS;  Service: Neurosurgery;  Laterality: Bilateral;  Bilateral deep brain stimulator placement    There were no vitals filed for this visit.   Subjective Assessment - 02/05/20 1334    Subjective Patient presents with aide today. Reports no falls or LOB since last session. Is having some R hip pain radiating down leg.    Pertinent History ULYSESS Mcgee is a 69 y.o. male with a hx of HTN, bradycardia, Parkinson's s/p deep brain stimulator 11/2013 , HLD, depression, possible seizure disorder, arthritis,  depression/PTSD, dysrhythmia, GERD, sleep apnea. Patient was seen by this therapist in years past. Patient had a fall around Halloween of 2020 and admitted for ORIF on 02/18/19, was discharged to SNF and had [place] around Christmas. Had home health therapy for a month and a half. After that tried to go to Outpatient Therapy for 3 sessions at [facility] and was not pleased due to how busy and crowded the location was. Fell last week in bathroom, didn't take walker in, falls about 2x month. Is very weak  in RLE. Patient has a new lift chair and U drive walking. Has a caregiver 4 days a week. Has additional order for radiculopathy, lumbar region. PMH of lumbar laminectomy/decompression microdiscectomy (L3-4, 4-5)  In 2014.  Lumbar 4-5 cage +rod (2016).    Limitations Walking;Standing;Sitting;House hold activities;Lifting    How long can you sit comfortably? not limited with a backrest, without a back rest 3 minutes    How long can you stand comfortably? 3-5 minutes with holding on.    How long can you walk comfortably? with U step 6 minutes.    Patient Stated Goals pain reduction of R hip. strength of legs, dance with wife. push lawnmower.    Currently in Pain? Yes    Pain Score 3     Pain Location Back    Pain Orientation Lower    Pain Descriptors / Indicators Aching    Pain Type Chronic pain    Pain Radiating Towards R hip    Pain Onset More than a month ago    Pain Frequency Intermittent           Patient reports right hip is bothering him more today.     [facility] PT Assessment - 02/05/20 0001      Standardized Balance Assessment   Standardized Balance Assessment Berg Balance Test      Berg Balance Test   Sit to Stand Able to stand without using hands and stabilize independently    Standing Unsupported Able to stand 2 minutes with supervision    Sitting with Back Unsupported but Feet Supported on Floor or Stool Able to sit safely and securely 2 minutes    Stand to Sit Sits safely with minimal use of hands    Transfers Able to transfer safely, minor use of hands    Standing Unsupported with Eyes Closed Able to stand 10 seconds with supervision    Standing Unsupported with Feet Together Able to place feet together independently and stand for 1 minute with supervision    From Standing, Reach Forward with Outstretched Arm Can reach confidently >25 cm (10")    From Standing Position, Pick up Object from Floor Able to pick up shoe, needs supervision    From Standing Position, Turn to  Look Behind Over each Shoulder Looks behind from both sides and weight shifts well    Turn 360 Degrees Able to turn 360 degrees safely one side only in 4 seconds or less    Standing Unsupported, Alternately Place Feet on Step/Stool Able to stand independently and complete 8 steps >20 seconds    Standing Unsupported, One Foot in Front Able to plae foot ahead of the other independently and hold 30 seconds    Standing on One Leg Able to lift leg independently and hold 5-10 seconds    Total Score 48          Recert VAS: 1/61  BERG: 48/56  MODI: 40%  FOTO: 47.9%   New goal:  walk in house safely without AD   Treatment: Standing in // bars:   Forward/backwards walk in // bars, focus on widening BOS 6x length of // bars  Large step forward to line, clap then step back, occasional UE support and occasional min A to reduce LOB.    supine: Hamstring stretch x 60 seconds each leg. PT educates caregiver on proper form and execution of task.  R hip inferior distractionwith belt SAD (short arc distraction)for pain management. 30 second hold6x.  R hiplateral distraction (SAD) with belt 4x 30 seconds  sidelying:  Hip extension RLE with Mod A for stabilization and amplitude of movement, progressively increasing arc of motion for hip flexor/quad lengthening x10   seated: Sit to stand with weighted (2000 gr) ball with focus on upright posture at top of stand Sit to stand, walk around cone and return to sitting position x 3 trials without AD; significant hip drop and fatigue requiring min A to retain COM    Pt educated throughout session about proper posture and technique with exercises. Improved exercise technique, movement at target joints, use of target muscles after min to mod verbal, visual, tactile cues  Patient progressing towards functional goals, increasing his stability allowing for progression of BERG goal due to meeting current goal. Patient requests new goal to address  ability to ambulate without an AD, however at this time patient is unsafe for this at this time. Overall patient is progressing despite pain in low back and hip impacting sessions. Patient would benefit from skilled physical therapy to increase strength, stability, balance, and decrease pain to improve quality of life               PT Education - 02/05/20 1336    Education provided Yes    Education Details exercise technique, body mechanics, goals, POC    Person(s) Educated Patient    Methods Explanation;Demonstration;Tactile cues;Verbal cues    Comprehension Verbalized understanding;Returned demonstration;Verbal cues required;Tactile cues required            PT Short Term Goals - 01/08/20 1024      PT SHORT TERM GOAL #1   Title Pt will perform HEP with family's/aide's supervision, for improved balance, transfers, and gait.    Baseline 5/5: HEP given 6/28: HEP compliant 8/23: aide helps with HEP    Time 4    Period Weeks    Status Achieved    Target Date 09/19/19             PT Long Term Goals - 02/05/20 1323      PT LONG TERM GOAL #1   Title Patient will increase Berg Balance score by > 6 points (54/56) to demonstrate decreased fall risk during functional activities.    Baseline 10/20: 48/56    Time 8    Period Weeks    Status New    Target Date 04/01/20      PT LONG TERM GOAL #2   Title Patient will ambulate within his household for ~ 5 minutes without an AD with no episodes of falling to increase independence with household mobility    Baseline 10/20: requires use of rollator to ambulate    Time 8    Period Weeks    Status New    Target Date 04/01/20      PT LONG TERM GOAL #4   Title Patient will increase FOTO score to equal to or greater than 31/100  to demonstrate statistically significant improvement in mobility and quality  of life.    Baseline 5/5: 12/100 6/28: 38% 8/23: 11.7 9/22: 45% 10/20: 47.9%    Time 8    Period Weeks    Status Achieved       PT LONG TERM GOAL #5   Title Patient will increase six minute walk test distance to >1000 for progression to community ambulator and improve gait ability    Baseline 6/28: 750 ft with U step rollator 8/23: 775 w rolling walker, increased crouch pattern of ambulation 9/22: deferred due to pain; 10/18: 829f    Time 8    Period Weeks    Status On-going    Target Date 04/01/20      PT LONG TERM GOAL #6   Title Patient will increase Berg Balance score by > 6 points (42/56) to demonstrate decreased fall risk during functional activities.    Baseline 6/28: 36/56 8/23: 42/56 9/22 44/56 10/20: 48/56    Time 8    Period Weeks    Status Achieved      PT LONG TERM GOAL #7   Title Patient will report a worst pain of 3/10 on VAS in low back to improve tolerance with ADLs and reduced symptoms with activities.    Baseline 8/9: 9/10 8/23: 7/10 pain 9/22: 7/10 10/20: 5/10    Time 8    Period Weeks    Status New    Target Date 04/01/20      PT LONG TERM GOAL #8   Title Patient will reduce modified Oswestry score to <20 as to demonstrate minimal disability with ADLs including improved sleeping tolerance, walking/sitting tolerance etc for better mobility with ADLs.    Baseline 8/9: 64% 8/23: 66% 9/22:44% 10/20: 40%    Time 8    Period Weeks    Status Partially Met    Target Date 04/01/20                 Plan - 02/05/20 1340    Clinical Impression Statement Patient progressing towards functional goals, increasing his stability allowing for progression of BERG goal due to meeting current goal. Patient requests new goal to address ability to ambulate without an AD, however at this time patient is unsafe for this at this time. Overall patient is progressing despite pain in low back and hip impacting sessions. Patient would benefit from skilled physical therapy to increase strength, stability, balance, and decrease pain to improve quality of life    Personal Factors and Comorbidities  Age;Comorbidity 3+;Past/Current Experience;Time since onset of injury/illness/exacerbation;Transportation    Comorbidities athritis, bradycardia, cancer, depression, dysrhythmia, GERD, HTN, Parkinson's, PTSD, SOB, sleep apnea    Examination-Activity Limitations Bathing;Bed Mobility;Caring for Others;Bend;Dressing;Lift;Locomotion Level;Reach Overhead;Sit;Squat;Stairs;Stand;Toileting;Transfers    Examination-Participation Restrictions Church;Cleaning;Community Activity;Interpersonal Relationship;Shop;Volunteer;Yard Work;Other    Stability/Clinical Decision Making Stable/Uncomplicated    Rehab Potential Fair    PT Frequency 2x / week    PT Duration 8 weeks    PT Treatment/Interventions ADLs/Self Care Home Management;Aquatic Therapy;Cryotherapy;Electrical Stimulation;Iontophoresis 488mml Dexamethasone;Moist Heat;Ultrasound;Contrast Bath;DME Instruction;Gait training;Stair training;Functional mobility training;Therapeutic activities;Therapeutic exercise;Balance training;Neuromuscular re-education;Patient/family education;Manual techniques;Wheelchair mobility training;Energy conservation;Passive range of motion;Traction;Orthotic Fit/Training;Dry needling;Vestibular;Visual/perceptual remediation/compensation;Taping;Canalith Repostioning    PT Next Visit Plan core and pain relief    PT Home Exercise Plan [place] Agree with Plan of Care Patient;Family member/caregiver    Family Member Consulted wife           Patient will benefit from skilled therapeutic intervention in order to improve the following deficits and impairments:  Abnormal gait, Decreased  balance, Decreased coordination, Decreased mobility, Impaired tone, Postural dysfunction, Decreased strength, Decreased safety awareness, Improper body mechanics, Impaired flexibility, Decreased activity tolerance, Decreased endurance, Decreased knowledge of precautions, Difficulty walking, Pain, Cardiopulmonary status limiting activity,  Decreased range of motion, Impaired perceived functional ability  Visit Diagnosis: Muscle weakness (generalized)  Other lack of coordination  Parkinson's disease (H[facility])  Unsteadiness on feet     Problem List Patient Active Problem List   Diagnosis Date Noted  . E. coli bacteremia 02/23/2019  . Dysphagia 02/23/2019  . Aspiration pneumonia ([place]) 02/21/2019  . Closed right hip fracture ([place]) 02/16/2019  . Rib pain on right side 09/25/2018  . Skin sore 12/01/2017  . Hematoma 10/18/2017  . Fall at home 10/18/2017  . 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 Radicular pain in right arm 10/09/2016  . GERD (gastroesophageal reflux disease) 07/31/2016  . Trochanteric bursitis 03/03/2016  . Left hand pain 10/30/2015  . Fracture, finger, multiple sites 10/30/2015  . Colon cancer screening 07/23/2015  . Healthcare maintenance 07/23/2015  . Gout 02/18/2015  . Depression 01/06/2015  . Irritation of eyelid 08/20/2014  . Dupuytren's contracture 08/20/2014  . Cough 07/21/2014  . Lumbar stenosis with neurogenic claudication 06/13/2014  . Preop cardiovascular exam 05/28/2014  . S/P deep brain stimulator placement 05/08/2014  . Joint pain 01/22/2014  . Medicare annual wellness visit, initial 01/19/2014  . Advance care planning 01/19/2014  . Parkinson's disease ([place]) 12/13/2013  . PTSD (post-traumatic stress disorder) 06/13/2013  . Erectile dysfunction 06/13/2013  . HLD (hyperlipidemia) 06/13/2013  . Hip pain 06/10/2013  . Pain in joint, shoulder region 06/10/2013  . Right leg swelling 06/03/2013  . Essential hypertension 06/03/2013  . Bradycardia by electrocardiogram 06/03/2013  . Obstructive sleep apnea 03/12/2013  . Spinal stenosis, lumbar region, with neurogenic claudication 12/14/2012   Janna Arch, PT, DPT   02/05/2020, 1:41 PM  [place] MAIN [facility] SERVICES [address] [place], [place], [address] Phone: [phone]   Fax:   [phone]  Name: ZAMIER EGGEBRECHT MRN: [id number] Date of Birth: 11-21-1950

## 2020-02-06 ENCOUNTER — Other Ambulatory Visit: Payer: Self-pay | Admitting: Family Medicine

## 2020-02-09 ENCOUNTER — Other Ambulatory Visit: Payer: Self-pay | Admitting: Family Medicine

## 2020-02-09 DIAGNOSIS — R8290 Unspecified abnormal findings in urine: Secondary | ICD-10-CM

## 2020-02-10 ENCOUNTER — Ambulatory Visit: Payer: Medicare PPO | Admitting: Occupational Therapy

## 2020-02-10 ENCOUNTER — Other Ambulatory Visit: Payer: Self-pay

## 2020-02-10 ENCOUNTER — Encounter: Payer: Self-pay | Admitting: Occupational Therapy

## 2020-02-10 ENCOUNTER — Ambulatory Visit: Payer: Medicare PPO

## 2020-02-10 DIAGNOSIS — G8929 Other chronic pain: Secondary | ICD-10-CM | POA: Diagnosis not present

## 2020-02-10 DIAGNOSIS — M6281 Muscle weakness (generalized): Secondary | ICD-10-CM | POA: Diagnosis not present

## 2020-02-10 DIAGNOSIS — M25551 Pain in right hip: Secondary | ICD-10-CM | POA: Diagnosis not present

## 2020-02-10 DIAGNOSIS — R2681 Unsteadiness on feet: Secondary | ICD-10-CM | POA: Diagnosis not present

## 2020-02-10 DIAGNOSIS — G20 Parkinson's disease: Secondary | ICD-10-CM

## 2020-02-10 DIAGNOSIS — M545 Low back pain, unspecified: Secondary | ICD-10-CM | POA: Diagnosis not present

## 2020-02-10 DIAGNOSIS — R278 Other lack of coordination: Secondary | ICD-10-CM

## 2020-02-10 DIAGNOSIS — R2689 Other abnormalities of gait and mobility: Secondary | ICD-10-CM | POA: Diagnosis not present

## 2020-02-10 DIAGNOSIS — R296 Repeated falls: Secondary | ICD-10-CM | POA: Diagnosis not present

## 2020-02-10 NOTE — Therapy (Signed)
[place] [facility] SERVICES [address] [place], [place], [address] Phone: [phone]   Fax:  [phone]  Physical Therapy Treatment  Patient Details  Name: [name] MRN: [id number] Date of Birth: 20-Nov-1950 Referring Provider (PT): Sharolyn Douglas MD    Encounter Date: 02/10/2020   PT End of Session - 02/10/20 0921    Visit Number 27    Number of Visits 52    Date for PT Re-Evaluation 04/01/20    Authorization Type 7/10 PN 9/22    PT Start Time 0914    PT Stop Time 0959    PT Time Calculation (min) 45 min    Equipment Utilized During Treatment Gait belt    Activity Tolerance Patient tolerated treatment well;Patient limited by pain    Behavior During Therapy [facility] for tasks assessed/performed           Past Medical History:  Diagnosis Date   Arthritis    Bradycardia    Cancer ([place]) 2013   skin cancer   Depression    ptsd   Dysrhythmia    chronic slow heart rate   GERD (gastroesophageal reflux disease)    Headache(784.0)    tension headaches non recent   History of chicken pox    History of kidney stones    passed   Hypertension    treated with HCTZ   Parkinson's disease ([place])    dx'ed 15 years ago   PTSD (post-traumatic stress disorder)    Shortness of breath dyspnea    Sleep apnea    doesn't use C-pap   Varicose veins     Past Surgical History:  Procedure Laterality Date   CHOLECYSTECTOMY N/A 10/22/2014   Procedure: LAPAROSCOPIC CHOLECYSTECTOMY WITH INTRAOPERATIVE CHOLANGIOGRAM;  Surgeon: Dia Crawford III, MD;  Location: [facility] ORS;  Service: General;  Laterality: N/A;   cyst removed      from lip as a child   INTRAMEDULLARY (IM) NAIL INTERTROCHANTERIC N/A 02/18/2019   Procedure: INTRAMEDULLARY (IM) NAIL INTERTROCHANTRIC, RIGHT,;  Surgeon: Thornton Park, MD;  Location: [facility] ORS;  Service: Orthopedics;  Laterality: N/A;   LUMBAR LAMINECTOMY/DECOMPRESSION MICRODISCECTOMY Bilateral 12/14/2012    Procedure: Bilateral lumbar three-four, four-five decompressive laminotomy/foraminotomy;  Surgeon: Charlie Pitter, MD;  Location: [place] [facility];  Service: Neurosurgery;  Laterality: Bilateral;   PULSE GENERATOR IMPLANT Bilateral 12/13/2013   Procedure: Bilateral implantable pulse generator placement;  Surgeon: Erline Levine, MD;  Location: [place] [facility] OR[facility];  Service: Neurosurgery;  Laterality: Bilateral;  Bilateral implantable pulse generator placement   skin cancer removed     from ears,   12 lft arm  rt leg 15   SUBTHALAMIC STIMULATOR BATTERY REPLACEMENT Bilateral 07/14/2017   Procedure: BILATERAL IMPLANTED PULSE GENERATOR CHANGE FOR DEEP BRAIN STIMULATOR;  Surgeon: Erline Levine, MD;  Location: [place];  Service: Neurosurgery;  Laterality: Bilateral;   SUBTHALAMIC STIMULATOR INSERTION Bilateral 12/06/2013   Procedure: SUBTHALAMIC STIMULATOR INSERTION;  Surgeon: Erline Levine, MD;  Location: [place] [facility];  Service: Neurosurgery;  Laterality: Bilateral;  Bilateral deep brain stimulator placement    There were no vitals filed for this visit.   Subjective Assessment - 02/10/20 0918    Subjective Patient presents with his wife, was working on a project on the floor and then couldn't get up initially, having a hard time pushing on his L side.    Pertinent History [name] is a 69 y.o. male with a hx of HTN, bradycardia, Parkinson's s/p deep brain stimulator 11/2013 ,  HLD, depression, possible seizure disorder, arthritis, depression/PTSD, dysrhythmia, GERD, sleep apnea. Patient was seen by this therapist in years past. Patient had a fall around Halloween of 2020 and admitted for ORIF on 02/18/19, was discharged to SNF and had [place] around Christmas. Had home health therapy for a month and a half. After that tried to go to Outpatient Therapy for 3 sessions at [facility] and was not pleased due to how busy and crowded the location was. Fell last week in bathroom, didn't take walker in, falls about 2x month. Is  very weak in RLE. Patient has a new lift chair and U drive walking. Has a caregiver 4 days a week. Has additional order for radiculopathy, lumbar region. PMH of lumbar laminectomy/decompression microdiscectomy (L3-4, 4-5)  In 2014.  Lumbar 4-5 cage +rod (2016).    Limitations Walking;Standing;Sitting;House hold activities;Lifting    How long can you sit comfortably? not limited with a backrest, without a back rest 3 minutes    How long can you stand comfortably? 3-5 minutes with holding on.    How long can you walk comfortably? with U step 6 minutes.    Patient Stated Goals pain reduction of R hip. strength of legs, dance with wife. push lawnmower.    Currently in Pain? Yes    Pain Score 2     Pain Location Back    Pain Orientation Lower    Pain Descriptors / Indicators Aching    Pain Type Chronic pain    Pain Onset More than a month ago    Pain Frequency Intermittent             Treatment:  Nustep Lvl 3 Rpm> 60 for cardiovascular challenge   Precor Leg press   Bilateral LE # 75; 12x; cues for eccentric control and slowing down for improved muscle contraction. ; second #115 10x with increased challenge   Single LE: #55, , 10x each LE, cues for slow eccentric control. 2 sets    supine: Hamstring stretch  x 60 seconds each leg. PT educates caregiver on proper form and execution of task.  R hip inferior distraction with belt SAD (short arc distraction) for pain management. 30 second hold 6x.  R hip lateral distraction (SAD) with belt 4x 30 seconds  BTB hip abduction 25x 2lb straight leg abduction with one LE in hooklying   Sit to stand, walk around cone and return to sitting position x 3 trials without AD; significant hip drop and fatigue requiring min A to retain COM     Pt educated throughout session about proper posture and technique with exercises. Improved exercise technique, movement at target joints, use of target muscles after min to mod verbal, visual, tactile  cues                         PT Education - 02/10/20 0921    Education provided Yes    Education Details exercise technique, body mechanics,    Person(s) Educated Patient    Methods Explanation;Tactile cues;Demonstration;Verbal cues    Comprehension Verbalized understanding;Returned demonstration;Verbal cues required;Tactile cues required            PT Short Term Goals - 01/08/20 1024      PT SHORT TERM GOAL #1   Title Pt will perform HEP with family's/aide's supervision, for improved balance, transfers, and gait.    Baseline 5/5: HEP given 6/28: HEP compliant 8/23: aide helps with HEP    Time 4  Period Weeks    Status Achieved    Target Date 09/19/19             PT Long Term Goals - 02/05/20 1323      PT LONG TERM GOAL #1   Title Patient will increase Berg Balance score by > 6 points (54/56) to demonstrate decreased fall risk during functional activities.    Baseline 10/20: 48/56    Time 8    Period Weeks    Status New    Target Date 04/01/20      PT LONG TERM GOAL #2   Title Patient will ambulate within his household for ~ 5 minutes without an AD with no episodes of falling to increase independence with household mobility    Baseline 10/20: requires use of rollator to ambulate    Time 8    Period Weeks    Status New    Target Date 04/01/20      PT LONG TERM GOAL #4   Title Patient will increase FOTO score to equal to or greater than 31/100  to demonstrate statistically significant improvement in mobility and quality of life.    Baseline 5/5: 12/100 6/28: 38% 8/23: 11.7 9/22: 45% 10/20: 47.9%    Time 8    Period Weeks    Status Achieved      PT LONG TERM GOAL #5   Title Patient will increase six minute walk test distance to >1000 for progression to community ambulator and improve gait ability    Baseline 6/28: 750 ft with U step rollator 8/23: 775 w rolling walker, increased crouch pattern of ambulation 9/22: deferred due to pain; 10/18:  89f    Time 8    Period Weeks    Status On-going    Target Date 04/01/20      PT LONG TERM GOAL #6   Title Patient will increase Berg Balance score by > 6 points (42/56) to demonstrate decreased fall risk during functional activities.    Baseline 6/28: 36/56 8/23: 42/56 9/22 44/56 10/20: 48/56    Time 8    Period Weeks    Status Achieved      PT LONG TERM GOAL #7   Title Patient will report a worst pain of 3/10 on VAS in low back to improve tolerance with ADLs and reduced symptoms with activities.    Baseline 8/9: 9/10 8/23: 7/10 pain 9/22: 7/10 10/20: 5/10    Time 8    Period Weeks    Status New    Target Date 04/01/20      PT LONG TERM GOAL #8   Title Patient will reduce modified Oswestry score to <20 as to demonstrate minimal disability with ADLs including improved sleeping tolerance, walking/sitting tolerance etc for better mobility with ADLs.    Baseline 8/9: 64% 8/23: 66% 9/22:44% 10/20: 40%    Time 8    Period Weeks    Status Partially Met    Target Date 04/01/20                 Plan - 02/10/20 1245    Clinical Impression Statement Patient progressing with functional strengthening and mobility tolerating progression of weights. Single LE control is more challenging, especially with RLE. Education on therapeutic riding options in the area provided. Patient would benefit from skilled physical therapy to increase strength, stability, balance, and decrease pain to improve quality of life.    Personal Factors and Comorbidities Age;Comorbidity 3+;Past/Current Experience;Time since onset of injury/illness/exacerbation;Transportation  Comorbidities athritis, bradycardia, cancer, depression, dysrhythmia, GERD, HTN, Parkinson's, PTSD, SOB, sleep apnea    Examination-Activity Limitations Bathing;Bed Mobility;Caring for Others;Bend;Dressing;Lift;Locomotion Level;Reach Overhead;Sit;Squat;Stairs;Stand;Toileting;Transfers    Examination-Participation Restrictions  Church;Cleaning;Community Activity;Interpersonal Relationship;Shop;Volunteer;Yard Work;Other    Stability/Clinical Decision Making Stable/Uncomplicated    Rehab Potential Fair    PT Frequency 2x / week    PT Duration 8 weeks    PT Treatment/Interventions ADLs/Self Care Home Management;Aquatic Therapy;Cryotherapy;Electrical Stimulation;Iontophoresis 39m/ml Dexamethasone;Moist Heat;Ultrasound;Contrast Bath;DME Instruction;Gait training;Stair training;Functional mobility training;Therapeutic activities;Therapeutic exercise;Balance training;Neuromuscular re-education;Patient/family education;Manual techniques;Wheelchair mobility training;Energy conservation;Passive range of motion;Traction;Orthotic Fit/Training;Dry needling;Vestibular;Visual/perceptual remediation/compensation;Taping;Canalith Repostioning    PT Next Visit Plan core and pain relief    PT Home Exercise Plan [place] Agree with Plan of Care Patient;Family member/caregiver    Family Member Consulted wife           Patient will benefit from skilled therapeutic intervention in order to improve the following deficits and impairments:  Abnormal gait, Decreased balance, Decreased coordination, Decreased mobility, Impaired tone, Postural dysfunction, Decreased strength, Decreased safety awareness, Improper body mechanics, Impaired flexibility, Decreased activity tolerance, Decreased endurance, Decreased knowledge of precautions, Difficulty walking, Pain, Cardiopulmonary status limiting activity, Decreased range of motion, Impaired perceived functional ability  Visit Diagnosis: Muscle weakness (generalized)  Other lack of coordination  [place]inson's disease ([place]  Unsteadiness on feet     Problem List Patient Active Problem List   Diagnosis Date Noted   E. coli bacteremia 02/23/2019   Dysphagia 02/23/2019   Aspiration pneumonia ([place] 02/21/2019   Closed right hip fracture ([place] 02/16/2019   Rib pain on right  side 09/25/2018   Skin sore 12/01/2017   Hematoma 10/18/2017   Fall at home 10/18/2017   REM behavioral disorder 11/02/2016   Radicular pain in right arm 10/09/2016   GERD (gastroesophageal reflux disease) 07/31/2016   Trochanteric bursitis 03/03/2016   Left hand pain 10/30/2015   Fracture, finger, multiple sites 10/30/2015   Colon cancer screening 07/23/2015   Healthcare maintenance 07/23/2015   Gout 02/18/2015   Depression 01/06/2015   Irritation of eyelid 08/20/2014   Dupuytren's contracture 08/20/2014   Cough 07/21/2014   Lumbar stenosis with neurogenic claudication 06/13/2014   Preop cardiovascular exam 05/28/2014   S/P deep brain stimulator placement 05/08/2014   Joint pain 01/22/2014   Medicare annual wellness visit, initial 01/19/2014   Advance care planning 01/19/2014   Parkinson's disease ([place] 12/13/2013   PTSD (post-traumatic stress disorder) 06/13/2013   Erectile dysfunction 06/13/2013   HLD (hyperlipidemia) 06/13/2013   Hip pain 06/10/2013   Pain in joint, shoulder region 06/10/2013   Right leg swelling 06/03/2013   Essential hypertension 06/03/2013   Bradycardia by electrocardiogram 06/03/2013   Obstructive sleep apnea 03/12/2013   Spinal stenosis, lumbar region, with neurogenic claudication 12/14/2012   MJanna Arch PT, DPT   02/10/2020, 12:46 PM  [place] [address]Phone: [phone]  Fax:  [phone] Name: TRAUNAK ANTUNAMRN: [id number] of Birth: 2January 05, 1952

## 2020-02-10 NOTE — Addendum Note (Signed)
Addended by: Judene Companion on: 02/10/2020 12:05 PM   Modules accepted: Orders

## 2020-02-10 NOTE — Therapy (Signed)
[place] MAIN [facility] SERVICES [address] [place], [place], [address] Phone: [phone]   Fax:  [phone]  Occupational Therapy Treatment  Patient Details  Name: [name] MRN: [id number] Date of Birth: Mar 06, 1951 No data recorded  Encounter Date: 02/10/2020   OT End of Session - 02/10/20 1158    Visit Number 12    Number of Visits 24    Date for OT Re-Evaluation 03/06/20    Authorization Type reporting period starting 10/202/2021    OT Start Time 1022    OT Stop Time 1100    OT Time Calculation (min) 38 min    Activity Tolerance Patient tolerated treatment well    Behavior During Therapy [facility] for tasks assessed/performed           Past Medical History:  Diagnosis Date   Arthritis    Bradycardia    Cancer ([place]) 2013   skin cancer   Depression    ptsd   Dysrhythmia    chronic slow heart rate   GERD (gastroesophageal reflux disease)    Headache(784.0)    tension headaches non recent   History of chicken pox    History of kidney stones    passed   Hypertension    treated with HCTZ   Parkinson's disease ([place])    dx'ed 15 years ago   PTSD (post-traumatic stress disorder)    Shortness of breath dyspnea    Sleep apnea    doesn't use C-pap   Varicose veins     Past Surgical History:  Procedure Laterality Date   CHOLECYSTECTOMY N/A 10/22/2014   Procedure: LAPAROSCOPIC CHOLECYSTECTOMY WITH INTRAOPERATIVE CHOLANGIOGRAM;  Surgeon: Dia Crawford III, MD;  Location: [facility] ORS;  Service: General;  Laterality: N/A;   cyst removed      from lip as a child   INTRAMEDULLARY (IM) NAIL INTERTROCHANTERIC N/A 02/18/2019   Procedure: INTRAMEDULLARY (IM) NAIL INTERTROCHANTRIC, RIGHT,;  Surgeon: Thornton Park, MD;  Location: [facility] ORS;  Service: Orthopedics;  Laterality: N/A;   LUMBAR LAMINECTOMY/DECOMPRESSION MICRODISCECTOMY Bilateral 12/14/2012   Procedure: Bilateral lumbar three-four, four-five decompressive  laminotomy/foraminotomy;  Surgeon: Charlie Pitter, MD;  Location: [place] NEURO ORS;  Service: Neurosurgery;  Laterality: Bilateral;   PULSE GENERATOR IMPLANT Bilateral 12/13/2013   Procedure: Bilateral implantable pulse generator placement;  Surgeon: Erline Levine, MD;  Location: [place] NEURO ORS;  Service: Neurosurgery;  Laterality: Bilateral;  Bilateral implantable pulse generator placement   skin cancer removed     from ears,   12 lft arm  rt leg 15   SUBTHALAMIC STIMULATOR BATTERY REPLACEMENT Bilateral 07/14/2017   Procedure: BILATERAL IMPLANTED PULSE GENERATOR CHANGE FOR DEEP BRAIN STIMULATOR;  Surgeon: Erline Levine, MD;  Location: [place];  Service: Neurosurgery;  Laterality: Bilateral;   SUBTHALAMIC STIMULATOR INSERTION Bilateral 12/06/2013   Procedure: SUBTHALAMIC STIMULATOR INSERTION;  Surgeon: Erline Levine, MD;  Location: [place] NEURO ORS;  Service: Neurosurgery;  Laterality: Bilateral;  Bilateral deep brain stimulator placement    There were no vitals filed for this visit.   Subjective Assessment - 02/10/20 1021    Subjective  Pt reports taking a driving assessment at the [place] and not passing.    Patient is accompanied by: Family member    Pertinent History Pt. is a 69 yo male who has a history of Parkinson's Disease. Pt. has a Deep Brain Stimulator in place. Pt. has a history of Low Back Pain with 2 back surgeries in August 2014, and February 2016. Pt. has a history  of multiple frequent falls, unsure of how many falls in the last 6 months.    Limitations Frequent falls, limited motor control, and [place].     Patient Stated Goals Pt would like to be more independent daily.    Currently in Pain? Yes    Pain Score 1     Pain Location Back    Pain Orientation Lower    Pain Descriptors / Indicators Aching    Pain Type Chronic pain    Pain Onset More than a month ago    Pain Onset --             SELF CARE Pt was MOD I doff L sock using dressing sick and required increased time and MIN cues to  doff R sock seated in chair. Pt initially required MIN A to don socks on sock aid - improved to MIN cues + increased time once pt donned reading glasses. Pt was Independent with donning/doffing B shoes.Pt required MIN A and MIN cues to button/unbutton 4 buttons on a shirt flat on the table using a button hook. Increased time and MIN cues to button/unbutton 3 buttons on shirt without using button hook - pt fatigued quickly and required significantly increased time for final button.  THEREX Pt performed seated 5# DB exercises for UE strengthening secondary to weakness: 1 set x 10 reps each scapular retraction, triceps extension, and front raises; 1 set x 20 reps each elbow flexion, shoulder flexion, and chest press.    Pt/family member provided contact information (below) for assessment results on his driving test.  [place] OT Supervisor contact info: Vickie Epley [phone]       OT Education - 02/10/20 1158    Education provided Yes    Education Details UE strength, visual scannign strategies    Person(s) Educated Patient;Spouse    Methods Explanation    Comprehension Verbalized understanding               OT Long Term Goals - 02/10/20 1203      OT LONG TERM GOAL #1   Title Pt. will improve Bilateral UE strength by 1 mm grade to assist with ADLs, and IADLs    Baseline BUE strength 4/5 overall at eval    Time 12    Period Weeks    Status On-going      OT LONG TERM GOAL #2   Title Pt. will improve grip strength by 10# to open jars and containers.    Baseline difficulty at eval    Time 12    Period Weeks    Status On-going      OT LONG TERM GOAL #3   Title Patient will complete lower body dressing with modified independence.    Baseline min to mod assist at eval; sock aid supervision, MOD A doff R sock, IND doff L    Time 12    Period Weeks    Status Partially Met      OT LONG TERM GOAL #4   Title Patient to demonstrate the ability to vacuum flooring at home with use of  modified techniques with supervision.    Baseline difficulty at eval    Time 12    Period Weeks    Status On-going      OT LONG TERM GOAL #5   Title Patient will demonstrate the ability to retrieve a snack and transport it to the living room with modified independence.    Baseline unable at eval  Time 12    Period Weeks    Status On-going      OT LONG TERM GOAL #6   Title Patient will improve hand function to be able to perform cutting of food with modified independence.    Baseline wife helps to cut food when needed; VCs cutting theraputty    Time 8    Period Weeks    Status Partially Met      OT LONG TERM GOAL #7   Title Pt. will write a paragraph efficiently with 90% legibility.    Baseline decreased legibility and poor hand endurance for writing more than 1 sentence.    Time 8    Period Weeks    Status On-going      OT LONG TERM GOAL  #10   TITLE Pt will don and doff pants with supervision    Baseline min assist to manage pants    Time 12    Period Weeks    Status On-going              Plan - 02/10/20 1200    Clinical Impression Statement Pt tolerated increased weight today progressing to dumbells for UE strengthening. Pt demonstrated good frustration toleranace and persistence during UBD. Pt continues to present with improved task initiation today. Pt required increased time, verbal, and visual cues for proper therex form/pace during the exercises. Pt continues to work on improving UE strength, [facility] skills, and reviewing A/E use in order to improve, and maximize independence with ADLs, and IADL tasks.    OT Occupational Profile and History Detailed Assessment- Review of Records and additional review of physical, cognitive, psychosocial history related to current functional performance    Occupational performance deficits (Please refer to evaluation for details): ADL's;IADL's;Leisure    Body Structure / Function / Physical Skills ADL;Coordination;Endurance;GMC;UE  functional use;Balance;Decreased knowledge of use of DME;IADL;Pain;Dexterity;FMC;Strength;Mobility;ROM    Cognitive Skills Attention;Problem Solve;Memory    Psychosocial Skills Coping Strategies;Environmental  Adaptations;Habits;Routines and Behaviors    Rehab Potential Good    Clinical Decision Making Limited treatment options, no task modification necessary    Comorbidities Affecting Occupational Performance: Presence of comorbidities impacting occupational performance    Comorbidities impacting occupational performance description: risk for falls, slow to respond and process information at times, safety    Modification or Assistance to Complete Evaluation  No modification of tasks or assist necessary to complete eval    OT Frequency 2x / week    OT Duration 12 weeks    OT Treatment/Interventions Self-care/ADL training;Therapeutic exercise;DME and/or AE instruction;Energy conservation;Neuromuscular education;Patient/family education;Therapeutic activities;Moist Heat;Functional Mobility Training;Cognitive remediation/compensation    Consulted and Agree with Plan of Care Patient           Patient will benefit from skilled therapeutic intervention in order to improve the following deficits and impairments:   Body Structure / Function / Physical Skills: ADL, Coordination, Endurance, GMC, UE functional use, Balance, Decreased knowledge of use of DME, IADL, Pain, Dexterity, FMC, Strength, Mobility, ROM Cognitive Skills: Attention, Problem Solve, Memory Psychosocial Skills: Coping Strategies, Environmental  Adaptations, Habits, Routines and Behaviors   Visit Diagnosis: Muscle weakness (generalized)  Other lack of coordination    Problem List Patient Active Problem List   Diagnosis Date Noted   E. coli bacteremia 02/23/2019   Dysphagia 02/23/2019   Aspiration pneumonia ([place]) 02/21/2019   Closed right hip fracture ([place]) 02/16/2019   Rib pain on right side 09/25/2018   Skin sore  12/01/2017   Hematoma 10/18/2017   Fall  at home 10/18/2017   REM behavioral disorder 11/02/2016   Radicular pain in right arm 10/09/2016   GERD (gastroesophageal reflux disease) 07/31/2016   Trochanteric bursitis 03/03/2016   Left hand pain 10/30/2015   Fracture, finger, multiple sites 10/30/2015   Colon cancer screening 07/23/2015   Healthcare maintenance 07/23/2015   Gout 02/18/2015   Depression 01/06/2015   Irritation of eyelid 08/20/2014   Dupuytren's contracture 08/20/2014   Cough 07/21/2014   Lumbar stenosis with neurogenic claudication 06/13/2014   Preop cardiovascular exam 05/28/2014   S/P deep brain stimulator placement 05/08/2014   Joint pain 01/22/2014   Medicare annual wellness visit, initial 01/19/2014   Advance care planning 01/19/2014   Parkinson's disease ([place]) 12/13/2013   PTSD (post-traumatic stress disorder) 06/13/2013   Erectile dysfunction 06/13/2013   HLD (hyperlipidemia) 06/13/2013   Hip pain 06/10/2013   Pain in joint, shoulder region 06/10/2013   Right leg swelling 06/03/2013   Essential hypertension 06/03/2013   Bradycardia by electrocardiogram 06/03/2013   Obstructive sleep apnea 03/12/2013   Spinal stenosis, lumbar region, with neurogenic claudication 12/14/2012    Dessie Coma, M.S. OTR/L  02/10/20, 12:06 PM  ascom [phone]  [place] MAIN [facility] SERVICES [place], [place], [address] Phone: [phone]   Fax:  [phone]  Name: TEEGAN BRANDIS MRN: [id number] Date of Birth: 02/04/1951

## 2020-02-12 ENCOUNTER — Encounter: Payer: Self-pay | Admitting: Occupational Therapy

## 2020-02-12 ENCOUNTER — Ambulatory Visit: Payer: Medicare PPO | Admitting: Occupational Therapy

## 2020-02-12 ENCOUNTER — Ambulatory Visit: Payer: Medicare PPO

## 2020-02-12 ENCOUNTER — Other Ambulatory Visit: Payer: Self-pay

## 2020-02-12 DIAGNOSIS — R296 Repeated falls: Secondary | ICD-10-CM | POA: Diagnosis not present

## 2020-02-12 DIAGNOSIS — M5416 Radiculopathy, lumbar region: Secondary | ICD-10-CM | POA: Diagnosis not present

## 2020-02-12 DIAGNOSIS — M25551 Pain in right hip: Secondary | ICD-10-CM

## 2020-02-12 DIAGNOSIS — R2689 Other abnormalities of gait and mobility: Secondary | ICD-10-CM | POA: Diagnosis not present

## 2020-02-12 DIAGNOSIS — M6281 Muscle weakness (generalized): Secondary | ICD-10-CM

## 2020-02-12 DIAGNOSIS — R278 Other lack of coordination: Secondary | ICD-10-CM | POA: Diagnosis not present

## 2020-02-12 DIAGNOSIS — G8929 Other chronic pain: Secondary | ICD-10-CM

## 2020-02-12 DIAGNOSIS — M47896 Other spondylosis, lumbar region: Secondary | ICD-10-CM | POA: Diagnosis not present

## 2020-02-12 DIAGNOSIS — M545 Low back pain, unspecified: Secondary | ICD-10-CM | POA: Diagnosis not present

## 2020-02-12 DIAGNOSIS — R2681 Unsteadiness on feet: Secondary | ICD-10-CM

## 2020-02-12 DIAGNOSIS — Z79899 Other long term (current) drug therapy: Secondary | ICD-10-CM | POA: Diagnosis not present

## 2020-02-12 DIAGNOSIS — G20 Parkinson's disease: Secondary | ICD-10-CM | POA: Diagnosis not present

## 2020-02-12 DIAGNOSIS — G894 Chronic pain syndrome: Secondary | ICD-10-CM | POA: Diagnosis not present

## 2020-02-12 NOTE — Therapy (Signed)
[place] [facility] MAIN [facility] SERVICES [address] [place], [place], [address] Phone: [phone]   Fax:  [phone]  Occupational Therapy Treatment  Patient Details  Name: [name] MRN: [id number] Date of Birth: August 03, 1950 No data recorded  Encounter Date: 02/12/2020   OT End of Session - 02/12/20 1324    Visit Number 13    Number of Visits 24    Date for OT Re-Evaluation 03/06/20    Authorization Type reporting period starting 10/202/2021    OT Start Time 1000    OT Stop Time 1045    OT Time Calculation (min) 45 min    Activity Tolerance Patient tolerated treatment well    Behavior During Therapy [facility] for tasks assessed/performed           Past Medical History:  Diagnosis Date  . Arthritis   . Bradycardia   . Cancer [facility]) 2013   skin cancer  . Depression    ptsd  . Dysrhythmia    chronic slow heart rate  . GERD (gastroesophageal reflux disease)   . Headache(784.0)    tension headaches non recent  . History of chicken pox   . History of kidney stones    passed  . Hypertension    treated with HCTZ  . Parkinson's disease ([facility])    dx'ed 15 years ago  . PTSD (post-traumatic stress disorder)   . Shortness of breath dyspnea   . Sleep apnea    doesn't use C-pap  . Varicose veins     Past Surgical History:  Procedure Laterality Date  . CHOLECYSTECTOMY N/A 10/22/2014   Procedure: LAPAROSCOPIC CHOLECYSTECTOMY WITH INTRAOPERATIVE CHOLANGIOGRAM;  Surgeon: Tiney Rouge III, MD;  Location: [facility] ORS;  Service: General;  Laterality: N/A;  . cyst removed      from lip as a child  . INTRAMEDULLARY (IM) NAIL INTERTROCHANTERIC N/A 02/18/2019   Procedure: INTRAMEDULLARY (IM) NAIL INTERTROCHANTRIC, RIGHT,;  Surgeon: Juanell Fairly, MD;  Location: [facility] ORS;  Service: Orthopedics;  Laterality: N/A;  . LUMBAR LAMINECTOMY/DECOMPRESSION MICRODISCECTOMY Bilateral 12/14/2012   Procedure: Bilateral lumbar three-four, four-five decompressive  laminotomy/foraminotomy;  Surgeon: Temple Pacini, MD;  Location: [facility];  Service: Neurosurgery;  Laterality: Bilateral;  . PULSE GENERATOR IMPLANT Bilateral 12/13/2013   Procedure: Bilateral implantable pulse generator placement;  Surgeon: Maeola Harman, MD;  Location: [facility]UR[facility] OR[facility];  Service: Neurosurgery;  Laterality: Bilateral;  Bilateral implantable pulse generator placement  . skin cancer removed     from ears,   12 lft arm  rt leg 15  . SUBTHALAMIC STIMULATOR BATTERY REPLACEMENT Bilateral 07/14/2017   Procedure: BILATERAL IMPLANTED PULSE GENERATOR CHANGE FOR DEEP BRAIN STIMULATOR;  Surgeon: Maeola Harman, MD;  Location: [facility];  Service: Neurosurgery;  Laterality: Bilateral;  . SUBTHALAMIC STIMULATOR INSERTION Bilateral 12/06/2013   Procedure: SUBTHALAMIC STIMULATOR INSERTION;  Surgeon: Maeola Harman, MD;  Location: [facility];  Service: Neurosurgery;  Laterality: Bilateral;  Bilateral deep brain stimulator placement     Subjective Assessment - 02/12/20 1319    Subjective  Pt. reports that the driving assessment at the [place] did not go so well.    Patient is accompanied by: Family member    Pertinent History Pt. is a 69 yo male who has a history of Parkinson's Disease. Pt. has a Deep Brain Stimulator in place. Pt. has a history of Low Back Pain with 2 back surgeries in August 2014, and February 2016. Pt. has a history of multiple frequent falls, unsure of how  many falls in the last 6 months.    Patient Stated Goals Pt would like to be more independent daily.    Currently in Pain? No/denies          OT Treatment  Therapeutic Exercise:  Pt. performed 5# dowel ex. For BUE strengthening secondary to weakness. Bilateral shoulder flexion, chest press, circular patterns, and elbow flexion/extension were performed 2 sets of 10-20 reps. Pt. Performed 4# dumbbell ex. for forearm supination/pronation, wrist flexion/extension.Pt. requires rest breaks and verbal cues, tactile cues, and cues for  visual demonstrationfor proper technique.Pt. required increased time to complete  Pt. with no reports of back pain today.Pt. tolerated increased weight today. Pt presents with delayed task initiation.Pt.requiredincreased timeverbal, and visual cues for proper there. Ex. form, and pace during the exercises. Pt. continues to work on improving UE strength, [facility] skill in order to work towards improving, and maximizing independence with ADLs, and IADLs. Plan to review adaptive equipment with the pt. next visit as pt.                                OT Long Term Goals - 02/10/20 1203      OT LONG TERM GOAL #1   Title Pt. will improve Bilateral UE strength by 1 mm grade to assist with ADLs, and IADLs    Baseline BUE strength 4/5 overall at eval    Time 12    Period Weeks    Status On-going      OT LONG TERM GOAL #2   Title Pt. will improve grip strength by 10# to open jars and containers.    Baseline difficulty at eval    Time 12    Period Weeks    Status On-going      OT LONG TERM GOAL #3   Title Patient will complete lower body dressing with modified independence.    Baseline min to mod assist at eval; sock aid supervision, MOD A doff R sock, IND doff L    Time 12    Period Weeks    Status Partially Met      OT LONG TERM GOAL #4   Title Patient to demonstrate the ability to vacuum flooring at home with use of modified techniques with supervision.    Baseline difficulty at eval    Time 12    Period Weeks    Status On-going      OT LONG TERM GOAL #5   Title Patient will demonstrate the ability to retrieve a snack and transport it to the living room with modified independence.    Baseline unable at eval    Time 12    Period Weeks    Status On-going      OT LONG TERM GOAL #6   Title Patient will improve hand function to be able to perform cutting of food with modified independence.    Baseline wife helps to cut food when needed; VCs cutting  theraputty    Time 8    Period Weeks    Status Partially Met      OT LONG TERM GOAL #7   Title Pt. will write a paragraph efficiently with 90% legibility.    Baseline decreased legibility and poor hand endurance for writing more than 1 sentence.    Time 8    Period Weeks    Status On-going      OT LONG TERM GOAL  #10  TITLE Pt will don and doff pants with supervision    Baseline min assist to manage pants    Time 12    Period Weeks    Status On-going                 Plan - 02/12/20 1312    Clinical Impression Statement Pt. with no reports of back pain today. Pt. tolerated increased weight today. Pt presents with delayed task initiation. Pt. required increased time verbal, and visual cues for proper there. Ex. form, and pace during the exercises. Pt. continues to work on improving UE strength, [facility] skill in order to work towards improving, and maximizing independence with ADLs, and IADLs. Plan to review adaptive equipment with the pt. next visit as pt.    OT Occupational Profile and History Detailed Assessment- Review of Records and additional review of physical, cognitive, psychosocial history related to current functional performance    Occupational performance deficits (Please refer to evaluation for details): ADL's;IADL's;Leisure    Body Structure / Function / Physical Skills ADL;Coordination;Endurance;GMC;UE functional use;Balance;Decreased knowledge of use of DME;IADL;Pain;Dexterity;FMC;Strength;Mobility;ROM    Cognitive Skills Attention;Problem Solve;Memory    Psychosocial Skills Coping Strategies;Environmental  Adaptations;Habits;Routines and Behaviors    Rehab Potential Good    Clinical Decision Making Limited treatment options, no task modification necessary    Comorbidities Affecting Occupational Performance: Presence of comorbidities impacting occupational performance    Comorbidities impacting occupational performance description: risk for falls, slow to respond and  process information at times, safety    Modification or Assistance to Complete Evaluation  No modification of tasks or assist necessary to complete eval    OT Frequency 2x / week    OT Duration 12 weeks    OT Treatment/Interventions Self-care/ADL training;Therapeutic exercise;DME and/or AE instruction;Energy conservation;Neuromuscular education;Patient/family education;Therapeutic activities;Moist Heat;Functional Mobility Training;Cognitive remediation/compensation    Consulted and Agree with Plan of Care Patient           Patient will benefit from skilled therapeutic intervention in order to improve the following deficits and impairments:   Body Structure / Function / Physical Skills: ADL, Coordination, Endurance, GMC, UE functional use, Balance, Decreased knowledge of use of DME, IADL, Pain, Dexterity, FMC, Strength, Mobility, ROM Cognitive Skills: Attention, Problem Solve, Memory Psychosocial Skills: Coping Strategies, Environmental  Adaptations, Habits, Routines and Behaviors   Visit Diagnosis: Muscle weakness (generalized)    Problem List Patient Active Problem List   Diagnosis Date Noted  . E. coli bacteremia 02/23/2019  . Dysphagia 02/23/2019  . Aspiration pneumonia ([place]) 02/21/2019  . Closed right hip fracture ([place]) 02/16/2019  . Rib pain on right side 09/25/2018  . Skin sore 12/01/2017  . Hematoma 10/18/2017  . Fall at home 10/18/2017  . 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 Radicular pain in right arm 10/09/2016  . GERD (gastroesophageal reflux disease) 07/31/2016  . Trochanteric bursitis 03/03/2016  . Left hand pain 10/30/2015  . Fracture, finger, multiple sites 10/30/2015  . Colon cancer screening 07/23/2015  . Healthcare maintenance 07/23/2015  . Gout 02/18/2015  . Depression 01/06/2015  . Irritation of eyelid 08/20/2014  . Dupuytren's contracture 08/20/2014  . Cough 07/21/2014  . Lumbar stenosis with neurogenic claudication 06/13/2014  . Preop  cardiovascular exam 05/28/2014  . S/P deep brain stimulator placement 05/08/2014  . Joint pain 01/22/2014  . Medicare annual wellness visit, initial 01/19/2014  . Advance care planning 01/19/2014  . Parkinson's disease ([place]) 12/13/2013  . PTSD (post-traumatic stress disorder) 06/13/2013  . Erectile dysfunction 06/13/2013  .  HLD (hyperlipidemia) 06/13/2013  . Hip pain 06/10/2013  . Pain in joint, shoulder region 06/10/2013  . Right leg swelling 06/03/2013  . Essential hypertension 06/03/2013  . Bradycardia by electrocardiogram 06/03/2013  . Obstructive sleep apnea 03/12/2013  . Spinal stenosis, lumbar region, with neurogenic claudication 12/14/2012    Harrel Carina, MS, OTR/L 02/12/2020, 1:25 PM  [place] MAIN [facility] SERVICES [address] [place], [place], [address] Phone: [phone]   Fax:  [phone]  Name: [name] MRN: [id number] Date of Birth: May 11, 1950

## 2020-02-12 NOTE — Therapy (Signed)
[place] MAIN [facility] SERVICES [address] [place], [place], [address] Phone: [phone]   Fax:  [phone]  Physical Therapy Treatment  Patient Details  Name: [name] MRN: [id number] Date of Birth: 02/21/1951 Referring Provider (PT): Sharolyn Douglas MD    Encounter Date: 02/12/2020   PT End of Session - 02/12/20 0913    Visit Number 28    Number of Visits 52    Date for PT Re-Evaluation 04/01/20    Authorization Type 8/10 PN 9/22    PT Start Time 0914    PT Stop Time 0958    PT Time Calculation (min) 44 min    Equipment Utilized During Treatment Gait belt    Activity Tolerance Patient tolerated treatment well;Patient limited by pain    Behavior During Therapy [facility] for tasks assessed/performed           Past Medical History:  Diagnosis Date   Arthritis    Bradycardia    Cancer ([place]) 2013   skin cancer   Depression    ptsd   Dysrhythmia    chronic slow heart rate   GERD (gastroesophageal reflux disease)    Headache(784.0)    tension headaches non recent   History of chicken pox    History of kidney stones    passed   Hypertension    treated with HCTZ   Parkinson's disease ([place])    dx'ed 15 years ago   PTSD (post-traumatic stress disorder)    Shortness of breath dyspnea    Sleep apnea    doesn't use C-pap   Varicose veins     Past Surgical History:  Procedure Laterality Date   CHOLECYSTECTOMY N/A 10/22/2014   Procedure: LAPAROSCOPIC CHOLECYSTECTOMY WITH INTRAOPERATIVE CHOLANGIOGRAM;  Surgeon: Dia Crawford III, MD;  Location: [facility] ORS;  Service: General;  Laterality: N/A;   cyst removed      from lip as a child   INTRAMEDULLARY (IM) NAIL INTERTROCHANTERIC N/A 02/18/2019   Procedure: INTRAMEDULLARY (IM) NAIL INTERTROCHANTRIC, RIGHT,;  Surgeon: Thornton Park, MD;  Location: [facility] ORS;  Service: Orthopedics;  Laterality: N/A;   LUMBAR LAMINECTOMY/DECOMPRESSION MICRODISCECTOMY Bilateral 12/14/2012    Procedure: Bilateral lumbar three-four, four-five decompressive laminotomy/foraminotomy;  Surgeon: Charlie Pitter, MD;  Location: [place] [facility];  Service: Neurosurgery;  Laterality: Bilateral;   PULSE GENERATOR IMPLANT Bilateral 12/13/2013   Procedure: Bilateral implantable pulse generator placement;  Surgeon: Erline Levine, MD;  Location: [place] [facility] OR[facility];  Service: Neurosurgery;  Laterality: Bilateral;  Bilateral implantable pulse generator placement   skin cancer removed     from ears,   12 lft arm  rt leg 15   SUBTHALAMIC STIMULATOR BATTERY REPLACEMENT Bilateral 07/14/2017   Procedure: BILATERAL IMPLANTED PULSE GENERATOR CHANGE FOR DEEP BRAIN STIMULATOR;  Surgeon: Erline Levine, MD;  Location: [place];  Service: Neurosurgery;  Laterality: Bilateral;   SUBTHALAMIC STIMULATOR INSERTION Bilateral 12/06/2013   Procedure: SUBTHALAMIC STIMULATOR INSERTION;  Surgeon: Erline Levine, MD;  Location: [place] [facility];  Service: Neurosurgery;  Laterality: Bilateral;  Bilateral deep brain stimulator placement    There were no vitals filed for this visit.   Subjective Assessment - 02/12/20 0916    Subjective Patient presents with caregiver this date stating he feels "better". Denies falls or LOB since last session.    Pertinent History [name] is a 69 y.o. male with a hx of HTN, bradycardia, Parkinson's s/p deep brain stimulator 11/2013 , HLD, depression, possible seizure disorder, arthritis, depression/PTSD, dysrhythmia, GERD, sleep apnea.  Patient was seen by this therapist in years past. Patient had a fall around Halloween of 2020 and admitted for ORIF on 02/18/19, was discharged to SNF and had [facility] around Christmas. Had home health therapy for a month and a half. After that tried to go to Outpatient Therapy for 3 sessions at [facility] and was not pleased due to how busy and crowded the location was. Fell last week in bathroom, didn't take walker in, falls about 2x month. Is very weak in RLE. Patient has a new  lift chair and U drive walking. Has a caregiver 4 days a week. Has additional order for radiculopathy, lumbar region. PMH of lumbar laminectomy/decompression microdiscectomy (L3-4, 4-5)  In 2014.  Lumbar 4-5 cage +rod (2016).    Limitations Walking;Standing;Sitting;House hold activities;Lifting    How long can you sit comfortably? not limited with a backrest, without a back rest 3 minutes    How long can you stand comfortably? 3-5 minutes with holding on.    How long can you walk comfortably? with U step 6 minutes.    Patient Stated Goals pain reduction of R hip. strength of legs, dance with wife. push lawnmower.    Currently in Pain? Yes    Pain Score 3     Pain Location Back    Pain Orientation Lower    Pain Descriptors / Indicators Aching    Pain Onset More than a month ago           Treatment: NuStep level 4, 4 minutes for BLE and BUE coordination  Supine: Hamstring stretch x 60 seconds each leg.   R hip inferior short arc distraction with belt for pain management. 30 seconds x 3 bouts.   R hip lateral short arc distraction with belt. 30 seconds x 3 bouts   Standing at Bar: Step taps on 6 step with 3# ankle weights. SUE support on bar to challenge balance. Patient demonstrates inc difficulty clearing LLE off step.  Obstacle course consisting of different sized bolsters to challenge coordination and balance in distracting environment. Performed 6x. Patient able to navigate 180 degree turns around bolster without instances of LOB.  On Airex Pad: normal BOS with horizontal head turns. CGA for safety. 30sec x 1  On Airex Pad: normal BOS with head nods. Patient demonstrates posterior LOB when looking down x 2, using hip strategy to stabilize.  Sit to stand chest passes with rainbow ball. CGA for safety. Performed 12x  Precor bilateral leg press 115# 12 x 3 sets.     Pt educated throughout session about proper posture and technique with exercises. Improved exercise technique,  movement at target joints, use of target muscles after min to mod verbal, visual, tactile cues.                       PT Education - 02/12/20 1002    Education provided Yes    Education Details Economist, exercise technique    Person(s) Educated Patient    Methods Explanation;Demonstration;Tactile cues;Verbal cues    Comprehension Tactile cues required;Verbalized understanding;Returned demonstration;Verbal cues required            PT Short Term Goals - 01/08/20 1024      PT SHORT TERM GOAL #1   Title Pt will perform HEP with family's/aide's supervision, for improved balance, transfers, and gait.    Baseline 5/5: HEP given 6/28: HEP compliant 8/23: aide helps with HEP    Time 4  Period Weeks    Status Achieved    Target Date 09/19/19             PT Long Term Goals - 02/05/20 1323      PT LONG TERM GOAL #1   Title Patient will increase Berg Balance score by > 6 points (54/56) to demonstrate decreased fall risk during functional activities.    Baseline 10/20: 48/56    Time 8    Period Weeks    Status New    Target Date 04/01/20      PT LONG TERM GOAL #2   Title Patient will ambulate within his household for ~ 5 minutes without an AD with no episodes of falling to increase independence with household mobility    Baseline 10/20: requires use of rollator to ambulate    Time 8    Period Weeks    Status New    Target Date 04/01/20      PT LONG TERM GOAL #4   Title Patient will increase FOTO score to equal to or greater than 31/100  to demonstrate statistically significant improvement in mobility and quality of life.    Baseline 5/5: 12/100 6/28: 38% 8/23: 11.7 9/22: 45% 10/20: 47.9%    Time 8    Period Weeks    Status Achieved      PT LONG TERM GOAL #5   Title Patient will increase six minute walk test distance to >1000 for progression to community ambulator and improve gait ability    Baseline 6/28: 750 ft with U step rollator 8/23: 775 w  rolling walker, increased crouch pattern of ambulation 9/22: deferred due to pain; 10/18: 841ft    Time 8    Period Weeks    Status On-going    Target Date 04/01/20      PT LONG TERM GOAL #6   Title Patient will increase Berg Balance score by > 6 points (42/56) to demonstrate decreased fall risk during functional activities.    Baseline 6/28: 36/56 8/23: 42/56 9/22 44/56 10/20: 48/56    Time 8    Period Weeks    Status Achieved      PT LONG TERM GOAL #7   Title Patient will report a worst pain of 3/10 on VAS in low back to improve tolerance with ADLs and reduced symptoms with activities.    Baseline 8/9: 9/10 8/23: 7/10 pain 9/22: 7/10 10/20: 5/10    Time 8    Period Weeks    Status New    Target Date 04/01/20      PT LONG TERM GOAL #8   Title Patient will reduce modified Oswestry score to <20 as to demonstrate minimal disability with ADLs including improved sleeping tolerance, walking/sitting tolerance etc for better mobility with ADLs.    Baseline 8/9: 64% 8/23: 66% 9/22:44% 10/20: 40%    Time 8    Period Weeks    Status Partially Met    Target Date 04/01/20                 Plan - 02/12/20 1010    Clinical Impression Statement Patient demonstrates improved balance this session, evidenced by no instances of LOB while performing turns. Patient able to tolerate ankle weights this session without provocation of pain. Patient ambulates with improved upright posture throughout session. Patient would benefit from skilled physical therapy to increase strength, stability, balance, and decrease pain to improve quality of life.    Personal Factors and Comorbidities Age;Comorbidity 3+;Past/Current  Experience;Time since onset of injury/illness/exacerbation;Transportation    Comorbidities athritis, bradycardia, cancer, depression, dysrhythmia, GERD, HTN, Parkinson's, PTSD, SOB, sleep apnea    Examination-Activity Limitations Bathing;Bed Mobility;Caring for  Others;Bend;Dressing;Lift;Locomotion Level;Reach Overhead;Sit;Squat;Stairs;Stand;Toileting;Transfers    Examination-Participation Restrictions Church;Cleaning;Community Activity;Interpersonal Relationship;Shop;Volunteer;Yard Work;Other    Stability/Clinical Decision Making Stable/Uncomplicated    Rehab Potential Fair    PT Frequency 2x / week    PT Duration 8 weeks    PT Treatment/Interventions ADLs/Self Care Home Management;Aquatic Therapy;Cryotherapy;Electrical Stimulation;Iontophoresis 4mg /ml Dexamethasone;Moist Heat;Ultrasound;Contrast Bath;DME Instruction;Gait training;Stair training;Functional mobility training;Therapeutic activities;Therapeutic exercise;Balance training;Neuromuscular re-education;Patient/family education;Manual techniques;Wheelchair mobility training;Energy conservation;Passive range of motion;Traction;Orthotic Fit/Training;Dry needling;Vestibular;Visual/perceptual remediation/compensation;Taping;Canalith Repostioning    PT Next Visit Plan core and pain relief    PT Home Exercise Plan [place] and Agree with Plan of Care Patient;Family member/caregiver    Family Member Consulted wife           Patient will benefit from skilled therapeutic intervention in order to improve the following deficits and impairments:  Abnormal gait, Decreased balance, Decreased coordination, Decreased mobility, Impaired tone, Postural dysfunction, Decreased strength, Decreased safety awareness, Improper body mechanics, Impaired flexibility, Decreased activity tolerance, Decreased endurance, Decreased knowledge of precautions, Difficulty walking, Pain, Cardiopulmonary status limiting activity, Decreased range of motion, Impaired perceived functional ability  Visit Diagnosis: Muscle weakness (generalized)  Unsteadiness on feet  Pain in right hip  Other abnormalities of gait and mobility  Chronic low back pain, unspecified back pain laterality, unspecified whether sciatica  present     Problem List Patient Active Problem List   Diagnosis Date Noted   E. coli bacteremia 02/23/2019   Dysphagia 02/23/2019   Aspiration pneumonia ([place]) 02/21/2019   Closed right hip fracture ([place]) 02/16/2019   Rib pain on right side 09/25/2018   Skin sore 12/01/2017   Hematoma 10/18/2017   Fall at home 10/18/2017   REM behavioral disorder 11/02/2016   Radicular pain in right arm 10/09/2016   GERD (gastroesophageal reflux disease) 07/31/2016   Trochanteric bursitis 03/03/2016   Left hand pain 10/30/2015   Fracture, finger, multiple sites 10/30/2015   Colon cancer screening 07/23/2015   Healthcare maintenance 07/23/2015   Gout 02/18/2015   Depression 01/06/2015   Irritation of eyelid 08/20/2014   Dupuytren's contracture 08/20/2014   Cough 07/21/2014   Lumbar stenosis with neurogenic claudication 06/13/2014   Preop cardiovascular exam 05/28/2014   S/P deep brain stimulator placement 05/08/2014   Joint pain 01/22/2014   Medicare annual wellness visit, initial 01/19/2014   Advance care planning 01/19/2014   Parkinson's disease ([place]) 12/13/2013   PTSD (post-traumatic stress disorder) 06/13/2013   Erectile dysfunction 06/13/2013   HLD (hyperlipidemia) 06/13/2013   Hip pain 06/10/2013   Pain in joint, shoulder region 06/10/2013   Right leg swelling 06/03/2013   Essential hypertension 06/03/2013   Bradycardia by electrocardiogram 06/03/2013   Obstructive sleep apnea 03/12/2013   Spinal stenosis, lumbar region, with neurogenic claudication 12/14/2012   Tonny Bollman, SPT  This entire session was performed under direct supervision and direction of a licensed therapist/therapist assistant . I have personally read, edited and approve of the note as written.  Janna Arch, PT, DPT   02/12/2020, 10:13 AM  [place] MAIN [facility] SERVICES [address] [place], [place], [address] Phone:  [phone]   Fax:  [phone]  Name: [name] MRN: [id number] Date of Birth: 1951/01/11

## 2020-02-17 ENCOUNTER — Ambulatory Visit: Payer: Medicare PPO | Admitting: Occupational Therapy

## 2020-02-17 ENCOUNTER — Ambulatory Visit: Payer: Medicare PPO

## 2020-02-17 ENCOUNTER — Telehealth: Payer: Self-pay

## 2020-02-17 ENCOUNTER — Ambulatory Visit (INDEPENDENT_AMBULATORY_CARE_PROVIDER_SITE_OTHER): Payer: Medicare PPO | Admitting: Psychology

## 2020-02-17 DIAGNOSIS — F4323 Adjustment disorder with mixed anxiety and depressed mood: Secondary | ICD-10-CM

## 2020-02-17 DIAGNOSIS — F331 Major depressive disorder, recurrent, moderate: Secondary | ICD-10-CM | POA: Diagnosis not present

## 2020-02-17 NOTE — Telephone Encounter (Signed)
[name] - Client TELEPHONE ADVICE RECORD [facility] Patient Name: [name] Gender: Male DOB: 02/11/1951 Age: 69 Y 92 M 15 D Return Phone Number: [phone] (Primary) Address: City/State/Zip: [place] [address] Client [facility] - Client Client Site [facility] Physician Renford Dills - MD Contact Type Call Who Is Calling Patient / Member / Family / Caregiver Call Type Triage / Clinical Caller Name [name] Relationship To Patient Spouse Return Phone Number [phone] (Primary) Chief Complaint Dizziness Reason for Call Symptomatic / Request for [place] states husband c/o dizziness x 5 days. Translation No Nurse Assessment Nurse: Cox, RN, [employer] Date/Time (Eastern Time): 02/17/2020 3:17:54 PM Confirm and document reason for call. If symptomatic, describe symptoms. ---Caller states husband has dizziness x 5 days when laying down Does the patient have any new or worsening symptoms? ---Yes Will a triage be completed? ---Yes Related visit to physician within the last 2 weeks? ---No Does the PT have any chronic conditions? (i.e. diabetes, asthma, this includes High risk factors for pregnancy, etc.) ---Yes List chronic conditions. ---Parkinsons, HR stays in mid 40s Is this a behavioral health or substance abuse call? ---No Guidelines Guideline Title Affirmed Question Affirmed Notes Nurse Date/Time (Eastern Time) Dizziness - Lightheadedness [1] MODERATE dizziness (e.g., interferes with normal activities) AND [2] has NOT been evaluated by physician for this (Exception: dizziness caused by heat exposure, sudden standing, or poor fluid intake) Cox, RN, [employer] 77/04/1655 9:03:83 PM Disp. Time Eilene Ghazi Time) Disposition Final User 02/17/2020 3:21:44 PM See PCP within 24 Hours Yes Cox, RN, [facility] PLEASE NOTE: All timestamps contained within this report  are represented as [place] Standard Time. CONFIDENTIALTY NOTICE: This fax transmission is intended only for the addressee. It contains information that is legally privileged, confidential or otherwise protected from use or disclosure. If you are not the intended recipient, you are strictly prohibited from reviewing, disclosing, copying using or disseminating any of this information or taking any action in reliance on or regarding this information. If you have received this fax in error, please notify us immediately by telephone so that we can arrange for its return to [place]. Phone: [phone], Toll-Free: [phone], Fax: [phone] Page: 2 of 2 Call Id: [id number] [place] Disagree/Comply Comply Caller Understands Yes PreDisposition Call a family member Care Advice Given Per Guideline SEE PCP WITHIN 24 HOURS: * IF OFFICE WILL BE OPEN: You need to be examined within the next 24 hours. Call your doctor (or NP/PA) when the office opens and make an appointment. * Drink several glasses of fruit juice, other clear fluids or water. DRINK FLUIDS: CALL BACK IF: * Passes out (faints) * You become worse CARE ADVICE given per Dizziness (Adult) guideline. Referrals REFERRED TO PCP OFFICE

## 2020-02-17 NOTE — Telephone Encounter (Signed)
Per appt notes pt already has appt to see Dr Damita Dunnings on 02/18/20 at 8 AM.

## 2020-02-18 ENCOUNTER — Ambulatory Visit (INDEPENDENT_AMBULATORY_CARE_PROVIDER_SITE_OTHER): Payer: Medicare PPO | Admitting: Family Medicine

## 2020-02-18 ENCOUNTER — Encounter: Payer: Self-pay | Admitting: Family Medicine

## 2020-02-18 ENCOUNTER — Other Ambulatory Visit: Payer: Self-pay

## 2020-02-18 DIAGNOSIS — R42 Dizziness and giddiness: Secondary | ICD-10-CM

## 2020-02-18 DIAGNOSIS — R8290 Unspecified abnormal findings in urine: Secondary | ICD-10-CM | POA: Diagnosis not present

## 2020-02-18 LAB — URINALYSIS, MICROSCOPIC ONLY

## 2020-02-18 NOTE — Progress Notes (Signed)
This visit occurred during the SARS-CoV-2 public health emergency.  Safety protocols were in place, including screening questions prior to the visit, additional usage of staff PPE, and extensive cleaning of exam room while observing appropriate contact time as indicated for disinfecting solutions.  Dizzy sensation.  Noted as he is laying down or a few seconds thereafter.  Intermittent.  Going on for about 1 week.  No sx now.  Sometimes noted when getting out of bed.  Not noted when cetting out of a chair.  Quick onset and resolution.  No hearing changes.  Dizzy sensation noted last night but not this AM.  No syncope.    H/o parkinson's at baseline, still on meds per EMR.  Most recent fall was a few nights ago.  Using walker at baseline.  He had gone to pick up the cat and toppled over.  Cautions discussed with patient  H/o hematuria with no pain on urination with repeat urine studies pending.  No abd pain.    Meds, vitals, and allergies reviewed.   ROS: Per HPI unless specifically indicated in ROS section   nad Ncat, TM wnl B Neck supple, no LA rrr ctab abd soft, not ttp Ext w/o edema Faint tremor noted at baseline, resting Bradykinesia and slowing of speech at baseline. Able to stand and get on the table.  Dix-Hallpike negative bilaterally.  Not orthostatic on sitting up. Strength and sensation grossly intact for the extremities.

## 2020-02-18 NOTE — Patient Instructions (Signed)
Likely BPV.  You can use the bedside exercise as needed.  Update me as needed.  Take care.  Glad to see you. Go to the lab on the way out.   If you have mychart we'll likely use that to update you.

## 2020-02-18 NOTE — Telephone Encounter (Signed)
Pt had appt 02/18/20.

## 2020-02-19 ENCOUNTER — Encounter: Payer: Self-pay | Admitting: Occupational Therapy

## 2020-02-19 ENCOUNTER — Ambulatory Visit: Payer: Medicare PPO | Attending: Neurology

## 2020-02-19 ENCOUNTER — Ambulatory Visit: Payer: Medicare PPO | Admitting: Occupational Therapy

## 2020-02-19 ENCOUNTER — Other Ambulatory Visit: Payer: Self-pay

## 2020-02-19 DIAGNOSIS — R296 Repeated falls: Secondary | ICD-10-CM | POA: Insufficient documentation

## 2020-02-19 DIAGNOSIS — R278 Other lack of coordination: Secondary | ICD-10-CM | POA: Insufficient documentation

## 2020-02-19 DIAGNOSIS — R2681 Unsteadiness on feet: Secondary | ICD-10-CM | POA: Diagnosis not present

## 2020-02-19 DIAGNOSIS — R2689 Other abnormalities of gait and mobility: Secondary | ICD-10-CM

## 2020-02-19 DIAGNOSIS — M6281 Muscle weakness (generalized): Secondary | ICD-10-CM

## 2020-02-19 DIAGNOSIS — M25551 Pain in right hip: Secondary | ICD-10-CM | POA: Diagnosis not present

## 2020-02-19 DIAGNOSIS — R262 Difficulty in walking, not elsewhere classified: Secondary | ICD-10-CM | POA: Diagnosis not present

## 2020-02-19 DIAGNOSIS — R8290 Unspecified abnormal findings in urine: Secondary | ICD-10-CM | POA: Insufficient documentation

## 2020-02-19 DIAGNOSIS — R42 Dizziness and giddiness: Secondary | ICD-10-CM | POA: Insufficient documentation

## 2020-02-19 LAB — URINE CULTURE
MICRO NUMBER:: 11149016
Result:: NO GROWTH
SPECIMEN QUALITY:: ADEQUATE

## 2020-02-19 NOTE — Assessment & Plan Note (Signed)
Resolved now.  Likely BPV.  Differential diagnosis discussed with patient.  He can do home bedside exercises as tolerated/if needed and update me as needed.  He agrees with plan.

## 2020-02-19 NOTE — Therapy (Signed)
[place] MAIN [facility] SERVICES [address] [place], [place], [address] Phone: [phone]   Fax:  [phone]  Occupational Therapy Treatment  Patient Details  Name: [name] MRN: [id number] Date of Birth: Aug 04, 1950 No data recorded  Encounter Date: 02/19/2020   OT End of Session - 02/19/20 1212    Visit Number 14    Number of Visits 24    Date for OT Re-Evaluation 03/06/20    Authorization Type reporting period starting 02/05/2020    OT Start Time 1015    OT Stop Time 1100    OT Time Calculation (min) 45 min    Activity Tolerance Patient tolerated treatment well    Behavior During Therapy [facility] for tasks assessed/performed           Past Medical History:  Diagnosis Date  . Arthritis   . Bradycardia   . Cancer [facility]) 2013   skin cancer  . Depression    ptsd  . Dysrhythmia    chronic slow heart rate  . GERD (gastroesophageal reflux disease)   . Headache(784.0)    tension headaches non recent  . History of chicken pox   . History of kidney stones    passed  . Hypertension    treated with HCTZ  . Parkinson's disease ([place])    dx'ed 15 years ago  . PTSD (post-traumatic stress disorder)   . Shortness of breath dyspnea   . Sleep apnea    doesn't use C-pap  . Varicose veins     Past Surgical History:  Procedure Laterality Date  . CHOLECYSTECTOMY N/A 10/22/2014   Procedure: LAPAROSCOPIC CHOLECYSTECTOMY WITH INTRAOPERATIVE CHOLANGIOGRAM;  Surgeon: Dia Crawford III, MD;  Location: [facility] ORS;  Service: General;  Laterality: N/A;  . cyst removed      from lip as a child  . INTRAMEDULLARY (IM) NAIL INTERTROCHANTERIC N/A 02/18/2019   Procedure: INTRAMEDULLARY (IM) NAIL INTERTROCHANTRIC, RIGHT,;  Surgeon: Thornton Park, MD;  Location: [facility] ORS;  Service: Orthopedics;  Laterality: N/A;  . LUMBAR LAMINECTOMY/DECOMPRESSION MICRODISCECTOMY Bilateral 12/14/2012   Procedure: Bilateral lumbar three-four, four-five decompressive  laminotomy/foraminotomy;  Surgeon: Charlie Pitter, MD;  Location: [place] [facility];  Service: Neurosurgery;  Laterality: Bilateral;  . PULSE GENERATOR IMPLANT Bilateral 12/13/2013   Procedure: Bilateral implantable pulse generator placement;  Surgeon: Erline Levine, MD;  Location: [place] [facility];  Service: Neurosurgery;  Laterality: Bilateral;  Bilateral implantable pulse generator placement  . skin cancer removed     from ears,   12 lft arm  rt leg 15  . SUBTHALAMIC STIMULATOR BATTERY REPLACEMENT Bilateral 07/14/2017   Procedure: BILATERAL IMPLANTED PULSE GENERATOR CHANGE FOR DEEP BRAIN STIMULATOR;  Surgeon: Erline Levine, MD;  Location: [place];  Service: Neurosurgery;  Laterality: Bilateral;  . SUBTHALAMIC STIMULATOR INSERTION Bilateral 12/06/2013   Procedure: SUBTHALAMIC STIMULATOR INSERTION;  Surgeon: Erline Levine, MD;  Location: [place] [facility];  Service: Neurosurgery;  Laterality: Bilateral;  Bilateral deep brain stimulator placement    There were no vitals filed for this visit.   Subjective Assessment - 02/19/20 1210    Subjective  Pt. reports having a vestibular rehab assessment on Monday afternoon.    Patient is accompanied by: Family member    Pertinent History Pt. is a 69 yo male who has a history of Parkinson's Disease. Pt. has a Deep Brain Stimulator in place. Pt. has a history of Low Back Pain with 2 back surgeries in August 2014, and February 2016. Pt. has a history of multiple  frequent falls, unsure of how many falls in the last 6 months.    Limitations Frequent falls, limited motor control, and [place].     Currently in Pain? Yes    Pain Score 6     Pain Location Back    Pain Orientation Upper;Left    Pain Descriptors / Indicators Aching    Pain Type Acute pain          OT TREATMENT    Selfcare:  Pt. worked on hiking his pants using a simulated method alternating with the left, and right hand. Pt. worked on sit to stand while pushing up from the armrest. Once standing holding the  rollator with one hand while hiking the pants with the opposite hand, and switching sides with emphasis placed on simulated method for hiking his pants over the side of the hip, as well as over the posterior hip.  Pt. Reviewed options for safety with fastening the pant button/snap.  Pt. reports having had a fall this morning at home while using the toilet in the bathroom. Pt. reports that he bumped his upper back, and shoulder blade. Pt. plans to have a battery replaced for the deep brain stimulator the week of Thanksgiving. Pt. was able to demonstrate safe method for hiking pants while alternating with the right, and hand. Pt. required verbal cues, and reminders to avoid letting go of the walker to hike the pants with bilateral hands. Pt. requires CGA for fastening the pant button/snap, and for tucking in his shirt secondary to retropulsion when reaching posteriorly with bilateral hands. Pt. Continues to work on improving ADL, and IADL functioning safely.                         OT Education - 02/19/20 1212    Education provided Yes    Education Details UE strength, visual scannign strategies    Person(s) Educated Patient;Spouse    Methods Explanation    Comprehension Verbalized understanding               OT Long Term Goals - 02/10/20 1203      OT LONG TERM GOAL #1   Title Pt. will improve Bilateral UE strength by 1 mm grade to assist with ADLs, and IADLs    Baseline BUE strength 4/5 overall at eval    Time 12    Period Weeks    Status On-going      OT LONG TERM GOAL #2   Title Pt. will improve grip strength by 10# to open jars and containers.    Baseline difficulty at eval    Time 12    Period Weeks    Status On-going      OT LONG TERM GOAL #3   Title Patient will complete lower body dressing with modified independence.    Baseline min to mod assist at eval; sock aid supervision, MOD A doff R sock, IND doff L    Time 12    Period Weeks    Status  Partially Met      OT LONG TERM GOAL #4   Title Patient to demonstrate the ability to vacuum flooring at home with use of modified techniques with supervision.    Baseline difficulty at eval    Time 12    Period Weeks    Status On-going      OT LONG TERM GOAL #5   Title Patient will demonstrate the ability to retrieve a snack and transport  it to the living room with modified independence.    Baseline unable at eval    Time 12    Period Weeks    Status On-going      OT LONG TERM GOAL #6   Title Patient will improve hand function to be able to perform cutting of food with modified independence.    Baseline wife helps to cut food when needed; VCs cutting theraputty    Time 8    Period Weeks    Status Partially Met      OT LONG TERM GOAL #7   Title Pt. will write a paragraph efficiently with 90% legibility.    Baseline decreased legibility and poor hand endurance for writing more than 1 sentence.    Time 8    Period Weeks    Status On-going      OT LONG TERM GOAL  #10   TITLE Pt will don and doff pants with supervision    Baseline min assist to manage pants    Time 12    Period Weeks    Status On-going                 Plan - 02/19/20 1213    Clinical Impression Statement Pt. reports having had a fall this morning at home while using the toilet in the bathroom. Pt. reports that he bumped his upper back, and shoulder blade. Pt. plans to have a battery replaced for the deep brain stimulator the week of Thanksgiving. Pt. was able to demonstrate safe method for hiking pants while alternating with the right, and hand. Pt. required verbal cues, and reminders to avoid letting go of the walker to hike the pants with bilateral hands. Pt. requires CGA for fastening the pant button/snap, and for tucking in his shirt secondary to retropulsion when reaching posteriorly with bilateral hands. Pt. Continues to work on improving ADL, and IADL functioning safely.   OT Occupational Profile  and History Detailed Assessment- Review of Records and additional review of physical, cognitive, psychosocial history related to current functional performance    Occupational performance deficits (Please refer to evaluation for details): ADL's;IADL's;Leisure    Body Structure / Function / Physical Skills ADL;Coordination;Endurance;GMC;UE functional use;Balance;Decreased knowledge of use of DME;IADL;Pain;Dexterity;FMC;Strength;Mobility;ROM    Cognitive Skills Attention;Problem Solve;Memory    Psychosocial Skills Coping Strategies;Environmental  Adaptations;Habits;Routines and Behaviors    Rehab Potential Good    Clinical Decision Making Limited treatment options, no task modification necessary    Comorbidities Affecting Occupational Performance: Presence of comorbidities impacting occupational performance    Comorbidities impacting occupational performance description: risk for falls, slow to respond and process information at times, safety    Modification or Assistance to Complete Evaluation  No modification of tasks or assist necessary to complete eval    OT Frequency 2x / week    OT Duration 12 weeks    OT Treatment/Interventions Self-care/ADL training;Therapeutic exercise;DME and/or AE instruction;Energy conservation;Neuromuscular education;Patient/family education;Therapeutic activities;Moist Heat;Functional Mobility Training;Cognitive remediation/compensation    Consulted and Agree with Plan of Care Patient           Patient will benefit from skilled therapeutic intervention in order to improve the following deficits and impairments:   Body Structure / Function / Physical Skills: ADL, Coordination, Endurance, GMC, UE functional use, Balance, Decreased knowledge of use of DME, IADL, Pain, Dexterity, FMC, Strength, Mobility, ROM Cognitive Skills: Attention, Problem Solve, Memory Psychosocial Skills: Coping Strategies, Environmental  Adaptations, Habits, Routines and Behaviors   Visit  Diagnosis: Muscle  weakness (generalized)  Other lack of coordination    Problem List Patient Active Problem List   Diagnosis Date Noted  . E. coli bacteremia 02/23/2019  . Dysphagia 02/23/2019  . Aspiration pneumonia ([place]) 02/21/2019  . Closed right hip fracture ([place]) 02/16/2019  . Rib pain on right side 09/25/2018  . Skin sore 12/01/2017  . Hematoma 10/18/2017  . Fall at home 10/18/2017  . 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 Radicular pain in right arm 10/09/2016  . GERD (gastroesophageal reflux disease) 07/31/2016  . Trochanteric bursitis 03/03/2016  . Left hand pain 10/30/2015  . Fracture, finger, multiple sites 10/30/2015  . Colon cancer screening 07/23/2015  . Healthcare maintenance 07/23/2015  . Gout 02/18/2015  . Depression 01/06/2015  . Irritation of eyelid 08/20/2014  . Dupuytren's contracture 08/20/2014  . Cough 07/21/2014  . Lumbar stenosis with neurogenic claudication 06/13/2014  . Preop cardiovascular exam 05/28/2014  . S/P deep brain stimulator placement 05/08/2014  . Joint pain 01/22/2014  . Medicare annual wellness visit, initial 01/19/2014  . Advance care planning 01/19/2014  . Parkinson's disease ([place]) 12/13/2013  . PTSD (post-traumatic stress disorder) 06/13/2013  . Erectile dysfunction 06/13/2013  . HLD (hyperlipidemia) 06/13/2013  . Hip pain 06/10/2013  . Pain in joint, shoulder region 06/10/2013  . Right leg swelling 06/03/2013  . Essential hypertension 06/03/2013  . Bradycardia by electrocardiogram 06/03/2013  . Obstructive sleep apnea 03/12/2013  . Spinal stenosis, lumbar region, with neurogenic claudication 12/14/2012    Harrel Carina, MS, OTR/L 02/19/2020, 12:18 PM  [place] MAIN [facility] SERVICES [address] [place], [place], [address] Phone: [phone]   Fax:  [phone]  Name: MARQUIS DOWN MRN: [id number] Date of Birth: 1951/04/11

## 2020-02-19 NOTE — Assessment & Plan Note (Signed)
See notes on recheck urinalysis.

## 2020-02-19 NOTE — Therapy (Signed)
[facility] MAIN [facility] SERVICES [address] [place], [place], [address] Phone: [phone]   Fax:  [phone]  Physical Therapy Treatment  Patient Details  Name: [name] MRN: [id number] Date of Birth: Dec 16, 1950 Referring Provider (PT): Si Gaul MD    Encounter Date: 02/19/2020   PT End of Session - 02/19/20 0928    Visit Number 29    Number of Visits 52    Date for PT Re-Evaluation 04/01/20    Authorization Type 9/10 PN 9/22    PT Start Time 0929    PT Stop Time 1014    PT Time Calculation (min) 45 min    Equipment Utilized During Treatment Gait belt    Activity Tolerance Patient tolerated treatment well;Patient limited by pain    Behavior During Therapy [facility] for tasks assessed/performed           Past Medical History:  Diagnosis Date  . Arthritis   . Bradycardia   . Cancer [facility]) 2013   skin cancer  . Depression    ptsd  . Dysrhythmia    chronic slow heart rate  . GERD (gastroesophageal reflux disease)   . Headache(784.0)    tension headaches non recent  . History of chicken pox   . History of kidney stones    passed  . Hypertension    treated with HCTZ  . Parkinson's disease (HCC)    dx'ed 15 years ago  . PTSD (post-traumatic stress disorder)   . Shortness of breath dyspnea   . Sleep apnea    doesn't use C-pap  . Varicose veins     Past Surgical History:  Procedure Laterality Date  . CHOLECYSTECTOMY N/A 10/22/2014   Procedure: LAPAROSCOPIC CHOLECYSTECTOMY WITH INTRAOPERATIVE CHOLANGIOGRAM;  Surgeon: Tiney Rouge III, MD;  Location: [facility] ORS;  Service: General;  Laterality: N/A;  . cyst removed      from lip as a child  . INTRAMEDULLARY (IM) NAIL INTERTROCHANTERIC N/A 02/18/2019   Procedure: INTRAMEDULLARY (IM) NAIL INTERTROCHANTRIC, RIGHT,;  Surgeon: Juanell Fairly, MD;  Location: [facility] ORS;  Service: Orthopedics;  Laterality: N/A;  . LUMBAR LAMINECTOMY/DECOMPRESSION MICRODISCECTOMY Bilateral 12/14/2012    Procedure: Bilateral lumbar three-four, four-five decompressive laminotomy/foraminotomy;  Surgeon: Temple Pacini, MD;  Location: [facility] ORS;  Service: Neurosurgery;  Laterality: Bilateral;  . PULSE GENERATOR IMPLANT Bilateral 12/13/2013   Procedure: Bilateral implantable pulse generator placement;  Surgeon: Maeola Harman, MD;  Location: [facility] OR[facility];  Service: Neurosurgery;  Laterality: Bilateral;  Bilateral implantable pulse generator placement  . skin cancer removed     from ears,   12 lft arm  rt leg 15  . SUBTHALAMIC STIMULATOR BATTERY REPLACEMENT Bilateral 07/14/2017   Procedure: BILATERAL IMPLANTED PULSE GENERATOR CHANGE FOR DEEP BRAIN STIMULATOR;  Surgeon: Maeola Harman, MD;  Location: [facility] OR;  Service: Neurosurgery;  Laterality: Bilateral;  . SUBTHALAMIC STIMULATOR INSERTION Bilateral 12/06/2013   Procedure: SUBTHALAMIC STIMULATOR INSERTION;  Surgeon: Maeola Harman, MD;  Location: [facility] ORS;  Service: Neurosurgery;  Laterality: Bilateral;  Bilateral deep brain stimulator placement    There were no vitals filed for this visit.   Subjective Assessment - 02/19/20 1116    Subjective Patient presents with aide and wife.Patient has had 4 falls in less than a week (started Friday). Last fall hit his upper back and has a knot this morning. Reporting some dizziness bending over.  Saw the doctor yesterday for it and was diagnosed with BPPV.    Pertinent History [name]  is a 69 y.o. male with a hx of HTN, bradycardia, Parkinson's s/p deep brain stimulator 11/2013 , HLD, depression, possible seizure disorder, arthritis, depression/PTSD, dysrhythmia, GERD, sleep apnea. Patient was seen by this therapist in years past. Patient had a fall around Halloween of 2020 and admitted for ORIF on 02/18/19, was discharged to SNF and had [facility] around Christmas. Had home health therapy for a month and a half. After that tried to go to Outpatient Therapy for 3 sessions at [facility] and was not pleased due to how  busy and crowded the location was. Fell last week in bathroom, didn't take walker in, falls about 2x month. Is very weak in RLE. Patient has a new lift chair and U drive walking. Has a caregiver 4 days a week. Has additional order for radiculopathy, lumbar region. PMH of lumbar laminectomy/decompression microdiscectomy (L3-4, 4-5)  In 2014.  Lumbar 4-5 cage +rod (2016).    Limitations Walking;Standing;Sitting;House hold activities;Lifting    How long can you sit comfortably? not limited with a backrest, without a back rest 3 minutes    How long can you stand comfortably? 3-5 minutes with holding on.    How long can you walk comfortably? with U step 6 minutes.    Patient Stated Goals pain reduction of R hip. strength of legs, dance with wife. push lawnmower.    Currently in Pain? Yes    Pain Score 6     Pain Location Back    Pain Orientation Left;Upper    Pain Descriptors / Indicators Aching    Pain Type Acute pain    Pain Onset More than a month ago    Pain Frequency Intermittent                 Vitals at start of session 166/109 pulse 47-take heavy clothing off Vitals second attempt: 163/65   Patient has had 4 falls in less than a week (started Friday). Last fall hit his upper back and has a knot this morning. Reporting some dizziness bending over.  Saw the doctor yesterday for it and was diagnosed with BPPV.    supine with ice pad to L scapula RTB around knees: -marching 20x cues for RLE raising leg -abduction 20x, tactile cueing for arc of motion RLE.  12x bridges cues for core contraction and gluteal squeeze   Seated: 3lb ankle weights: -LAQ 15x to PT hand for full arc of motion each LE -marching with arms crossed and cues for midline posture 15x each LE -alternating IR/ER 15x  Standing: Static stand with min A to find midline 60 seconds x 3 trials Stand with hip shifts to L /R, challenging to shift past midline towards L this session x 60 seconds x 2 trials Stand,  reach forward grab ball and throw at target for stabilization against pertubation. X 3 minutes  Ambulate 86 ft with RW, cues for widening BOS and upright posture. Close CGA     Pt educated throughout session about proper posture and technique with exercises. Improved exercise technique, movement at target joints, use of target muscles after min to mod verbal, visual, tactile cues.                  PT Education - 02/19/20 0919    Education provided Yes    Education Details exercise technique, body mechanics    Person(s) Educated Patient    Methods Explanation;Demonstration;Tactile cues;Verbal cues    Comprehension Verbalized understanding;Returned demonstration;Verbal cues required;Tactile cues required  PT Short Term Goals - 01/08/20 1024      PT SHORT TERM GOAL #1   Title Pt will perform HEP with family's/aide's supervision, for improved balance, transfers, and gait.    Baseline 5/5: HEP given 6/28: HEP compliant 8/23: aide helps with HEP    Time 4    Period Weeks    Status Achieved    Target Date 09/19/19             PT Long Term Goals - 02/05/20 1323      PT LONG TERM GOAL #1   Title Patient will increase Berg Balance score by > 6 points (54/56) to demonstrate decreased fall risk during functional activities.    Baseline 10/20: 48/56    Time 8    Period Weeks    Status New    Target Date 04/01/20      PT LONG TERM GOAL #2   Title Patient will ambulate within his household for ~ 5 minutes without an AD with no episodes of falling to increase independence with household mobility    Baseline 10/20: requires use of rollator to ambulate    Time 8    Period Weeks    Status New    Target Date 04/01/20      PT LONG TERM GOAL #4   Title Patient will increase FOTO score to equal to or greater than 31/100  to demonstrate statistically significant improvement in mobility and quality of life.    Baseline 5/5: 12/100 6/28: 38% 8/23: 11.7 9/22: 45%  10/20: 47.9%    Time 8    Period Weeks    Status Achieved      PT LONG TERM GOAL #5   Title Patient will increase six minute walk test distance to >1000 for progression to community ambulator and improve gait ability    Baseline 6/28: 750 ft with U step rollator 8/23: 775 w rolling walker, increased crouch pattern of ambulation 9/22: deferred due to pain; 10/18: 844ft    Time 8    Period Weeks    Status On-going    Target Date 04/01/20      PT LONG TERM GOAL #6   Title Patient will increase Berg Balance score by > 6 points (42/56) to demonstrate decreased fall risk during functional activities.    Baseline 6/28: 36/56 8/23: 42/56 9/22 44/56 10/20: 48/56    Time 8    Period Weeks    Status Achieved      PT LONG TERM GOAL #7   Title Patient will report a worst pain of 3/10 on VAS in low back to improve tolerance with ADLs and reduced symptoms with activities.    Baseline 8/9: 9/10 8/23: 7/10 pain 9/22: 7/10 10/20: 5/10    Time 8    Period Weeks    Status New    Target Date 04/01/20      PT LONG TERM GOAL #8   Title Patient will reduce modified Oswestry score to <20 as to demonstrate minimal disability with ADLs including improved sleeping tolerance, walking/sitting tolerance etc for better mobility with ADLs.    Baseline 8/9: 64% 8/23: 66% 9/22:44% 10/20: 40%    Time 8    Period Weeks    Status Partially Met    Target Date 04/01/20                 Plan - 02/19/20 1118    Clinical Impression Statement Patient's session limited due to change in patient  status. Patient high level of falls in past few days is potentially explainable by new diagnosis of BPPV by physician, will see a vestibular therapist for next session as it is within [place] at this time. Frequent rest breaks required due to fatigue, fear of movement due to high volume of falls, and soreness from falls.Patient would benefit from skilled physical therapy to increase strength, stability, balance, and decrease pain  to improve quality of life    Personal Factors and Comorbidities Age;Comorbidity 3+;Past/Current Experience;Time since onset of injury/illness/exacerbation;Transportation    Comorbidities athritis, bradycardia, cancer, depression, dysrhythmia, GERD, HTN, Parkinson's, PTSD, SOB, sleep apnea    Examination-Activity Limitations Bathing;Bed Mobility;Caring for Others;Bend;Dressing;Lift;Locomotion Level;Reach Overhead;Sit;Squat;Stairs;Stand;Toileting;Transfers    Examination-Participation Restrictions Church;Cleaning;Community Activity;Interpersonal Relationship;Shop;Volunteer;Yard Work;Other    Stability/Clinical Decision Making Stable/Uncomplicated    Rehab Potential Fair    PT Frequency 2x / week    PT Duration 8 weeks    PT Treatment/Interventions ADLs/Self Care Home Management;Aquatic Therapy;Cryotherapy;Electrical Stimulation;Iontophoresis 4mg /ml Dexamethasone;Moist Heat;Ultrasound;Contrast Bath;DME Instruction;Gait training;Stair training;Functional mobility training;Therapeutic activities;Therapeutic exercise;Balance training;Neuromuscular re-education;Patient/family education;Manual techniques;Wheelchair mobility training;Energy conservation;Passive range of motion;Traction;Orthotic Fit/Training;Dry needling;Vestibular;Visual/perceptual remediation/compensation;Taping;Canalith Repostioning    PT Next Visit Plan core and pain relief    PT Home Exercise Plan [place] and Agree with Plan of Care Patient;Family member/caregiver    Family Member Consulted wife           Patient will benefit from skilled therapeutic intervention in order to improve the following deficits and impairments:  Abnormal gait, Decreased balance, Decreased coordination, Decreased mobility, Impaired tone, Postural dysfunction, Decreased strength, Decreased safety awareness, Improper body mechanics, Impaired flexibility, Decreased activity tolerance, Decreased endurance, Decreased knowledge of precautions,  Difficulty walking, Pain, Cardiopulmonary status limiting activity, Decreased range of motion, Impaired perceived functional ability  Visit Diagnosis: Muscle weakness (generalized)  Unsteadiness on feet  Pain in right hip  Other abnormalities of gait and mobility     Problem List Patient Active Problem List   Diagnosis Date Noted  . E. coli bacteremia 02/23/2019  . Dysphagia 02/23/2019  . Aspiration pneumonia ([place]) 02/21/2019  . Closed right hip fracture ([place]) 02/16/2019  . Rib pain on right side 09/25/2018  . Skin sore 12/01/2017  . Hematoma 10/18/2017  . Fall at home 10/18/2017  . 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 Radicular pain in right arm 10/09/2016  . GERD (gastroesophageal reflux disease) 07/31/2016  . Trochanteric bursitis 03/03/2016  . Left hand pain 10/30/2015  . Fracture, finger, multiple sites 10/30/2015  . Colon cancer screening 07/23/2015  . Healthcare maintenance 07/23/2015  . Gout 02/18/2015  . Depression 01/06/2015  . Irritation of eyelid 08/20/2014  . Dupuytren's contracture 08/20/2014  . Cough 07/21/2014  . Lumbar stenosis with neurogenic claudication 06/13/2014  . Preop cardiovascular exam 05/28/2014  . S/P deep brain stimulator placement 05/08/2014  . Joint pain 01/22/2014  . Medicare annual wellness visit, initial 01/19/2014  . Advance care planning 01/19/2014  . Parkinson's disease ([place]) 12/13/2013  . PTSD (post-traumatic stress disorder) 06/13/2013  . Erectile dysfunction 06/13/2013  . HLD (hyperlipidemia) 06/13/2013  . Hip pain 06/10/2013  . Pain in joint, shoulder region 06/10/2013  . Right leg swelling 06/03/2013  . Essential hypertension 06/03/2013  . Bradycardia by electrocardiogram 06/03/2013  . Obstructive sleep apnea 03/12/2013  . Spinal stenosis, lumbar region, with neurogenic claudication 12/14/2012   Janna Arch, PT, DPT   02/19/2020, 11:20 AM  [facility] [address] [place], [place], [address] Phone: [phone]  Fax:  [phone]  Name: [name] MRN: [id number] Date of Birth: [birth year]

## 2020-02-24 ENCOUNTER — Other Ambulatory Visit: Payer: Self-pay

## 2020-02-24 ENCOUNTER — Ambulatory Visit: Payer: Medicare PPO

## 2020-02-24 ENCOUNTER — Ambulatory Visit: Payer: Medicare PPO | Admitting: Physical Therapy

## 2020-02-24 ENCOUNTER — Ambulatory Visit: Payer: Medicare PPO | Admitting: Occupational Therapy

## 2020-02-24 ENCOUNTER — Encounter: Payer: Self-pay | Admitting: Physical Therapy

## 2020-02-24 DIAGNOSIS — R2681 Unsteadiness on feet: Secondary | ICD-10-CM

## 2020-02-24 DIAGNOSIS — M6281 Muscle weakness (generalized): Secondary | ICD-10-CM

## 2020-02-24 DIAGNOSIS — M25551 Pain in right hip: Secondary | ICD-10-CM | POA: Diagnosis not present

## 2020-02-24 DIAGNOSIS — R262 Difficulty in walking, not elsewhere classified: Secondary | ICD-10-CM

## 2020-02-24 DIAGNOSIS — R296 Repeated falls: Secondary | ICD-10-CM | POA: Diagnosis not present

## 2020-02-24 DIAGNOSIS — R278 Other lack of coordination: Secondary | ICD-10-CM

## 2020-02-24 DIAGNOSIS — R2689 Other abnormalities of gait and mobility: Secondary | ICD-10-CM | POA: Diagnosis not present

## 2020-02-24 NOTE — Therapy (Signed)
[place] [facility] SERVICES [address] [place], [place], [address] Phone: [phone]   Fax:  [phone]  Physical Therapy Treatment/Progress Note Dates of service: 08/21/2019-02/24/2020   Visit # 30 Patient Details  Name: [name] MRN: [id number] Date of Birth: 08-03-1950 Referring Provider (PT): Sharolyn Douglas MD    Encounter Date: 02/24/2020   PT End of Session - 02/24/20 1500    Visit Number 30    Number of Visits 52    Date for PT Re-Evaluation 04/01/20    Authorization Type 9/10 PN 9/22    PT Start Time 1500    PT Stop Time 1545    PT Time Calculation (min) 45 min    Activity Tolerance Patient tolerated treatment well    Behavior During Therapy [facility] for tasks assessed/performed           Past Medical History:  Diagnosis Date   Arthritis    Bradycardia    Cancer ([place]) 2013   skin cancer   Depression    ptsd   Dysrhythmia    chronic slow heart rate   GERD (gastroesophageal reflux disease)    Headache(784.0)    tension headaches non recent   History of chicken pox    History of kidney stones    passed   Hypertension    treated with HCTZ   Parkinson's disease ([place])    dx'ed 15 years ago   PTSD (post-traumatic stress disorder)    Shortness of breath dyspnea    Sleep apnea    doesn't use C-pap   Varicose veins     Past Surgical History:  Procedure Laterality Date   CHOLECYSTECTOMY N/A 10/22/2014   Procedure: LAPAROSCOPIC CHOLECYSTECTOMY WITH INTRAOPERATIVE CHOLANGIOGRAM;  Surgeon: Dia Crawford III, MD;  Location: [facility] ORS;  Service: General;  Laterality: N/A;   cyst removed      from lip as a child   INTRAMEDULLARY (IM) NAIL INTERTROCHANTERIC N/A 02/18/2019   Procedure: INTRAMEDULLARY (IM) NAIL INTERTROCHANTRIC, RIGHT,;  Surgeon: Thornton Park, MD;  Location: [facility] ORS;  Service: Orthopedics;  Laterality: N/A;   LUMBAR LAMINECTOMY/DECOMPRESSION MICRODISCECTOMY Bilateral 12/14/2012    Procedure: Bilateral lumbar three-four, four-five decompressive laminotomy/foraminotomy;  Surgeon: Charlie Pitter, MD;  Location: [place] [facility] ORS;  Service: Neurosurgery;  Laterality: Bilateral;   PULSE GENERATOR IMPLANT Bilateral 12/13/2013   Procedure: Bilateral implantable pulse generator placement;  Surgeon: Erline Levine, MD;  Location: [place] [facility] ORS;  Service: Neurosurgery;  Laterality: Bilateral;  Bilateral implantable pulse generator placement   skin cancer removed     from ears,   12 lft arm  rt leg 15   SUBTHALAMIC STIMULATOR BATTERY REPLACEMENT Bilateral 07/14/2017   Procedure: BILATERAL IMPLANTED PULSE GENERATOR CHANGE FOR DEEP BRAIN STIMULATOR;  Surgeon: Erline Levine, MD;  Location: [place];  Service: Neurosurgery;  Laterality: Bilateral;   SUBTHALAMIC STIMULATOR INSERTION Bilateral 12/06/2013   Procedure: SUBTHALAMIC STIMULATOR INSERTION;  Surgeon: Erline Levine, MD;  Location: [place] [facility] ORS;  Service: Neurosurgery;  Laterality: Bilateral;  Bilateral deep brain stimulator placement    There were no vitals filed for this visit.   Subjective Assessment - 02/24/20 1503    Subjective Patient reports he has not had any further falls since last seen in the clinic. Patient states when he gets into bed or when he gets up he gets a throbbing in his head. Patient reports pulsing in his head but no spinning and states it lasts seconds.    Pertinent History [name] is  a 69 y.o. male with a hx of HTN, bradycardia, Parkinson's s/p deep brain stimulator 11/2013 , HLD, depression, possible seizure disorder, arthritis, depression/PTSD, dysrhythmia, GERD, sleep apnea. Patient was seen by this therapist in years past. Patient had a fall around Halloween of 2020 and admitted for ORIF on 02/18/19, was discharged to SNF and had [place] around Christmas. Had home health therapy for a month and a half. After that tried to go to Outpatient Therapy for 3 sessions at [facility] and was not pleased due to how busy and  crowded the location was. Fell last week in bathroom, didn't take walker in, falls about 2x month. Is very weak in RLE. Patient has a new lift chair and U drive walking. Has a caregiver 4 days a week. Has additional order for radiculopathy, lumbar region. PMH of lumbar laminectomy/decompression microdiscectomy (L3-4, 4-5)  In 2014.  Lumbar 4-5 cage +rod (2016).    Limitations Walking;Standing;Sitting;House hold activities;Lifting    How long can you sit comfortably? not limited with a backrest, without a back rest 3 minutes    How long can you stand comfortably? 3-5 minutes with holding on.    How long can you walk comfortably? with U step 6 minutes.    Patient Stated Goals pain reduction of R hip. strength of legs, dance with wife. push lawnmower.    Pain Onset More than a month ago            Vestibular Screening:  Subjective:  Patient states when he gets into bed or when he gets up he gets a throbbing in his head. Patient reports pulsing in his head but no spinning and states it lasts seconds.  Patient reports at times when he stands up he gets swimmy headed and light headed sensation. Patient reports that for bathing he sits on a shower chair and tips his head forward to wash his hair and he states this does not   MUSCULOSKELETAL SCREEN: Cervical Spine ROM: cervical AROM flexion, extension, right and left rotation [facility] without pain.  OCULOMOTOR / VESTIBULAR TESTING: Oculomotor Exam- Room Light  Normal Abnormal Comments  Ocular Alignment N    Ocular ROM N    Spontaneous Nystagmus N    Gaze evoked Nystagmus N    Smooth Pursuit  Abn Multiple corrective saccades noted  Saccades  Abn Hypometric saccades noted in right and left fields and slow movements in all fields; patient reports increased blurring but states he has baseline blurry vision; performed 2 reps 1 30 sec and 1 60 sec no dizziness but shadow  VOR   Saw an "outline" like a shadow, but denies dizziness  VOR Cancellation N     Left Head Impulse N    Right Head Impulse N  One corrective saccade noted but could have been startle reflex because could not reproduce   BPPV TESTS:  Dix-Hallpike: Performed on inverted mat table, left and right Dix-Hallpike tests and both were negative with patient denying vertigo and no nystagmus observed. Performed on inverted mat table, left and right horizontal canal test and both were negative with no nystagmus observed and patient denied vertigo and dizziness.  Symptoms Duration Intensity Nystagmus  Left Dix-Hallpike None  N/A None observed  Right Dix-Hallpike None  N/A None observed  Left Head Roll None  N/A None observed  Right Head Roll None  N/A None observed    Orthostatic Blood pressure Readings: Blood pressure taken in left arm. Supine: 152/69 mmHg Sitting:127/58 mmHg Standing: 108/66 Patient  had a change in systolic blood pressure of 25 points with change in position from sup to sitting and 44 points with change in position from supine to standing. Orthostatic Strategies: Patient reports episodes of light-headedness when moving quickly during transitional movements such as sit to stand. Discussed and demonstrated strategies to try to help minimize dizziness from position changes including maintaining adequate hydration, moving more slowly when transitioning and before standing performing sitting marches or long arc quads and upon standing performing side to side sways or marching in place.   Patient screened for vestibular deficits this date. Patient reports that he has been experiencing a throbbing/pusing sensation in his head that lasts seconds at times when getting onto or out of bed. Patient denies vertigo and dizziness. Patient also reports that at times he gets lightheaded with positional changes such as sitting up or standing up. Patient with negative Dix-Hallpike and horizontal canal testing bilaterally this date with no nystagmus observed and patient denied vertigo,  dizziness or reproduction of any symptoms. Patient noted to have abnormal smooth pursuits and saccades with vestibulo-ocular testing which could be indicative of central signs. Patient reported that target was blurred at rest when doing VOR X1 and that he saw an outline like a shadow when performing this exercise. Patient given VOR X 1 in sitting to try until he is seen in the clinic next to see if it recreates any dizziness and if the outline/shadow decreased with repeated practice. Patient's goals are to be assessed next session by primary therapist. Patient would benefit from continued skilled PT services to further address functional deficits and goals and to try to reduce patient's falls risk.     PT Education - 02/24/20 1500    Education Details discussed Dix-Hallpike and horizontal canal testing and BPPV; discussed VOR X 1 in sitting and issued trial of a few days for HEP.    Person(s) Educated Patient;Caregiver(s)    Methods Explanation;Demonstration;Verbal cues;Handout    Comprehension Verbalized understanding;Returned demonstration;Verbal cues required            PT Short Term Goals - 01/08/20 1024      PT SHORT TERM GOAL #1   Title Pt will perform HEP with family's/aide's supervision, for improved balance, transfers, and gait.    Baseline 5/5: HEP given 6/28: HEP compliant 8/23: aide helps with HEP    Time 4    Period Weeks    Status Achieved    Target Date 09/19/19             PT Long Term Goals - 02/05/20 1323      PT LONG TERM GOAL #1   Title Patient will increase Berg Balance score by > 6 points (54/56) to demonstrate decreased fall risk during functional activities.    Baseline 10/20: 48/56    Time 8    Period Weeks    Status New    Target Date 04/01/20      PT LONG TERM GOAL #2   Title Patient will ambulate within his household for ~ 5 minutes without an AD with no episodes of falling to increase independence with household mobility    Baseline 10/20: requires  use of rollator to ambulate    Time 8    Period Weeks    Status New    Target Date 04/01/20      PT LONG TERM GOAL #4   Title Patient will increase FOTO score to equal to or greater than 31/100  to demonstrate statistically  significant improvement in mobility and quality of life.    Baseline 5/5: 12/100 6/28: 38% 8/23: 11.7 9/22: 45% 10/20: 47.9%    Time 8    Period Weeks    Status Achieved      PT LONG TERM GOAL #5   Title Patient will increase six minute walk test distance to >1000 for progression to community ambulator and improve gait ability    Baseline 6/28: 750 ft with U step rollator 8/23: 775 w rolling walker, increased crouch pattern of ambulation 9/22: deferred due to pain; 10/18: 830f    Time 8    Period Weeks    Status On-going    Target Date 04/01/20      PT LONG TERM GOAL #6   Title Patient will increase Berg Balance score by > 6 points (42/56) to demonstrate decreased fall risk during functional activities.    Baseline 6/28: 36/56 8/23: 42/56 9/22 44/56 10/20: 48/56    Time 8    Period Weeks    Status Achieved      PT LONG TERM GOAL #7   Title Patient will report a worst pain of 3/10 on VAS in low back to improve tolerance with ADLs and reduced symptoms with activities.    Baseline 8/9: 9/10 8/23: 7/10 pain 9/22: 7/10 10/20: 5/10    Time 8    Period Weeks    Status New    Target Date 04/01/20      PT LONG TERM GOAL #8   Title Patient will reduce modified Oswestry score to <20 as to demonstrate minimal disability with ADLs including improved sleeping tolerance, walking/sitting tolerance etc for better mobility with ADLs.    Baseline 8/9: 64% 8/23: 66% 9/22:44% 10/20: 40%    Time 8    Period Weeks    Status Partially Met    Target Date 04/01/20                 Plan - 02/25/20 1240    Clinical Impression Statement Patient screened for vestibular deficits this date. Patient reports that he has been experiencing a throbbing/pusing sensation in his  head that lasts seconds at times when getting onto or out of bed. Patient denies vertigo and dizziness. Patient also reports that at times he gets lightheaded with positional changes such as sitting up or standing up. Patient with negative Dix-Hallpike and horizontal canal testing bilaterally this date with no nystagmus observed and patient denied vertigo, dizziness or reproduction of any symptoms. Patient noted to have abnormal smooth pursuits and saccades with vestibulo-ocular testing which could be indicative of central signs. Patient reported that target was blurred at rest when doing VOR X1 and that he saw an outline like a shadow when performing this exercise. Patient given VOR X 1 in sitting to try until he is seen in the clinic next to see if it recreates any dizziness and if the outline/shadow decreased with repeated practice. Patient's goals are to be assessed next session by primary therapist. Patient would benefit from continued skilled PT services to further address functional deficits and goals and to try to reduce patient's falls risk.    Personal Factors and Comorbidities Age;Comorbidity 3+;Past/Current Experience;Time since onset of injury/illness/exacerbation;Transportation    Comorbidities athritis, bradycardia, cancer, depression, dysrhythmia, GERD, HTN, Parkinson's, PTSD, SOB, sleep apnea    Examination-Activity Limitations Bathing;Bed Mobility;Caring for Others;Bend;Dressing;Lift;Locomotion Level;Reach Overhead;Sit;Squat;Stairs;Stand;Toileting;Transfers    Examination-Participation Restrictions Church;Cleaning;Community Activity;Interpersonal Relationship;Shop;Volunteer;Yard Work;Other    Stability/Clinical Decision Making Stable/Uncomplicated    Rehab Potential  Fair    PT Frequency 2x / week    PT Duration 8 weeks    PT Treatment/Interventions ADLs/Self Care Home Management;Aquatic Therapy;Cryotherapy;Electrical Stimulation;Iontophoresis 74m/ml Dexamethasone;Moist  Heat;Ultrasound;Contrast Bath;DME Instruction;Gait training;Stair training;Functional mobility training;Therapeutic activities;Therapeutic exercise;Balance training;Neuromuscular re-education;Patient/family education;Manual techniques;Wheelchair mobility training;Energy conservation;Passive range of motion;Traction;Orthotic Fit/Training;Dry needling;Vestibular;Visual/perceptual remediation/compensation;Taping;Canalith Repostioning    PT Next Visit Plan core and pain relief    PT Home Exercise Plan [place] Agree with Plan of Care Patient;Family member/caregiver    Family Member Consulted wife           Patient will benefit from skilled therapeutic intervention in order to improve the following deficits and impairments:  Abnormal gait, Decreased balance, Decreased coordination, Decreased mobility, Impaired tone, Postural dysfunction, Decreased strength, Decreased safety awareness, Improper body mechanics, Impaired flexibility, Decreased activity tolerance, Decreased endurance, Decreased knowledge of precautions, Difficulty walking, Pain, Cardiopulmonary status limiting activity, Decreased range of motion, Impaired perceived functional ability  Visit Diagnosis: Muscle weakness (generalized)  Other lack of coordination  Unsteadiness on feet  Repeated falls  Difficulty walking     Problem List Patient Active Problem List   Diagnosis Date Noted   Vertigo 02/19/2020   Urine abnormality 02/19/2020   E. coli bacteremia 02/23/2019   Dysphagia 02/23/2019   [place]iration pneumonia ([place] 02/21/[place]   Closed right [place] fracture ([place] 02/16/2019   Rib pain on right side 09/25/2018   Skin sore 12/01/2017   Hematoma 10/18/2017   Fall at home 10/18/2017   REM behavioral disorder 11/02/2016   Radicular pain in right arm 10/09/2016   GERD (gastroesophageal reflux disease) 07/31/2016   Trochanteric bursitis 03/03/2016   Left hand pain 10/30/2015   Fracture,  finger, multiple sites 10/30/2015   Colon cancer screening 07/23/2015   Healthcare maintenance 07/23/2015   Gout 02/18/2015   Depression 01/06/2015   Irritation of eyelid 08/20/2014   Dupuytren's contracture 08/20/2014   Cough 07/21/2014   Lumbar stenosis with neurogenic claudication 06/13/2014   Preop cardiovascular exam 05/28/2014   S/P deep brain stimulator placement 05/08/2014   Joint pain 01/22/2014   Medicare annual wellness visit, initial 01/19/2014   Advance care planning 01/19/2014   [place]inson's disease ([place] 12/13/2013   PTSD (post-traumatic stress disorder) 06/13/2013   Erectile dysfunction 06/13/2013   HLD (hyperlipidemia) 06/13/2013   Hip pain 06/10/2013   Pain in joint, shoulder region 06/10/2013   Right leg swelling 06/03/2013   Essential hypertension 06/03/2013   Bradycardia by electrocardiogram 06/03/2013   Obstructive sleep apnea 03/12/2013   Spinal stenosis, lumbar region, with neurogenic claudication 12/14/2012   DLady DeutscherPT, DPT #[phone] Deutscher11/12/2019, 12:40 PM  [place] [facility]SERVICES [address]R[place] [address]Phone: [phone]  Fax:  [phone] Name: TMELESIO MADARAMRN: [id number] of Birth: [birth year]

## 2020-02-26 ENCOUNTER — Encounter: Payer: Self-pay | Admitting: Occupational Therapy

## 2020-02-26 ENCOUNTER — Ambulatory Visit: Payer: Medicare PPO | Admitting: Occupational Therapy

## 2020-02-26 ENCOUNTER — Other Ambulatory Visit: Payer: Self-pay

## 2020-02-26 ENCOUNTER — Ambulatory Visit: Payer: Medicare PPO

## 2020-02-26 DIAGNOSIS — R296 Repeated falls: Secondary | ICD-10-CM

## 2020-02-26 DIAGNOSIS — R2681 Unsteadiness on feet: Secondary | ICD-10-CM | POA: Diagnosis not present

## 2020-02-26 DIAGNOSIS — M6281 Muscle weakness (generalized): Secondary | ICD-10-CM | POA: Diagnosis not present

## 2020-02-26 DIAGNOSIS — M25551 Pain in right hip: Secondary | ICD-10-CM | POA: Diagnosis not present

## 2020-02-26 DIAGNOSIS — R278 Other lack of coordination: Secondary | ICD-10-CM | POA: Diagnosis not present

## 2020-02-26 DIAGNOSIS — R262 Difficulty in walking, not elsewhere classified: Secondary | ICD-10-CM | POA: Diagnosis not present

## 2020-02-26 DIAGNOSIS — R2689 Other abnormalities of gait and mobility: Secondary | ICD-10-CM | POA: Diagnosis not present

## 2020-02-26 NOTE — Therapy (Signed)
[place] MAIN [facility] SERVICES [address] [place], [place], [address] Phone: [phone]   Fax:  [phone]  Occupational Therapy Treatment  Patient Details  Name: [name] MRN: [id number] Date of Birth: 07-25-1950 No data recorded  Encounter Date: 02/26/2020   OT End of Session - 02/26/20 1146    Visit Number 15    Number of Visits 24    Authorization Type reporting period starting 02/05/2020    OT Start Time 1015    OT Stop Time 1100    OT Time Calculation (min) 45 min    Activity Tolerance Patient tolerated treatment well    Behavior During Therapy [facility] for tasks assessed/performed           Past Medical History:  Diagnosis Date  . Arthritis   . Bradycardia   . Cancer [facility]) 2013   skin cancer  . Depression    ptsd  . Dysrhythmia    chronic slow heart rate  . GERD (gastroesophageal reflux disease)   . Headache(784.0)    tension headaches non recent  . History of chicken pox   . History of kidney stones    passed  . Hypertension    treated with HCTZ  . Parkinson's disease ([place])    dx'ed 15 years ago  . PTSD (post-traumatic stress disorder)   . Shortness of breath dyspnea   . Sleep apnea    doesn't use C-pap  . Varicose veins     Past Surgical History:  Procedure Laterality Date  . CHOLECYSTECTOMY N/A 10/22/2014   Procedure: LAPAROSCOPIC CHOLECYSTECTOMY WITH INTRAOPERATIVE CHOLANGIOGRAM;  Surgeon: Dia Crawford III, MD;  Location: [facility] ORS;  Service: General;  Laterality: N/A;  . cyst removed      from lip as a child  . INTRAMEDULLARY (IM) NAIL INTERTROCHANTERIC N/A 02/18/2019   Procedure: INTRAMEDULLARY (IM) NAIL INTERTROCHANTRIC, RIGHT,;  Surgeon: Thornton Park, MD;  Location: [facility] ORS;  Service: Orthopedics;  Laterality: N/A;  . LUMBAR LAMINECTOMY/DECOMPRESSION MICRODISCECTOMY Bilateral 12/14/2012   Procedure: Bilateral lumbar three-four, four-five decompressive laminotomy/foraminotomy;  Surgeon: Charlie Pitter,  MD;  Location: [place] NEURO ORS;  Service: Neurosurgery;  Laterality: Bilateral;  . PULSE GENERATOR IMPLANT Bilateral 12/13/2013   Procedure: Bilateral implantable pulse generator placement;  Surgeon: Erline Levine, MD;  Location: [place] NEURO ORS;  Service: Neurosurgery;  Laterality: Bilateral;  Bilateral implantable pulse generator placement  . skin cancer removed     from ears,   12 lft arm  rt leg 15  . SUBTHALAMIC STIMULATOR BATTERY REPLACEMENT Bilateral 07/14/2017   Procedure: BILATERAL IMPLANTED PULSE GENERATOR CHANGE FOR DEEP BRAIN STIMULATOR;  Surgeon: Erline Levine, MD;  Location: [place];  Service: Neurosurgery;  Laterality: Bilateral;  . SUBTHALAMIC STIMULATOR INSERTION Bilateral 12/06/2013   Procedure: SUBTHALAMIC STIMULATOR INSERTION;  Surgeon: Erline Levine, MD;  Location: [place] NEURO ORS;  Service: Neurosurgery;  Laterality: Bilateral;  Bilateral deep brain stimulator placement    There were no vitals filed for this visit.   Subjective Assessment - 02/26/20 1143    Subjective  Pt. reports no more falls, and that he wears tennis shoes when standing now.    Patient is accompanied by: Family member    Pertinent History Pt. is a 69 yo male who has a history of Parkinson's Disease. Pt. has a Deep Brain Stimulator in place. Pt. has a history of Low Back Pain with 2 back surgeries in August 2014, and February 2016. Pt. has a history of multiple frequent falls, unsure of  how many falls in the last 6 months.    Patient Stated Goals Pt would like to be more independent daily.    Currently in Pain? Yes    Pain Score 4     Pain Location Shoulder    Pain Orientation Right;Left           OT TREATMENT    Selfcare:  Pt. worked on Geographical information systems officer using a buttonhook on multiple sizes of buttons. Pt. required visual cues, the use of his reading glasses for magnification, and increased time to complete with the buttonhook.  Pt. worked on hiking his pants using a simulated method alternating with the left,  and right hand. Pt. worked on sit to stand while pushing up from the armrest. Once standing holding the rollator with one hand while hiking the pants with the opposite hand, and switching sides with emphasis placed on simulated method for hiking his pants over the side of the hip, as well as over the posterior hip.    There. Ex:  3# dumbbell ex. for elbow flexion and extension,  forearm supination/pronation, and wrist flexion/extension for 1 set 20 reps each. Pt. requires rest breaks and verbal cues for proper technique.  Pt. reports no additional falls, and that he has resumed wearing shoes with solid grips on the bottoms when in the bathroom. Pt. plans to have a battery replaced for the deep brain stimulator the week of Thanksgiving. Pt. was able to demonstrate safe method for hiking pants while alternating with the right, and hand. Pt. required verbal cues, and reminders to avoid letting go of the walker to hike the pants with bilateral hands. Pt. Continues to require CGA for fastening the pant button/snap, and for tucking in his shirt secondary to retropulsion when reaching posteriorly with bilateral hands.  Pt. Was able to complete buttoning faster, and more accurately without the buttonhook. Pt. Continues to work on improving ADL, and IADL functioning safely.                            OT Education - 02/26/20 1145    Education provided Yes    Education Details A/E use, left hand strength    Person(s) Educated Patient;Spouse    Methods Explanation    Comprehension Verbalized understanding               OT Long Term Goals - 02/10/20 1203      OT LONG TERM GOAL #1   Title Pt. will improve Bilateral UE strength by 1 mm grade to assist with ADLs, and IADLs    Baseline BUE strength 4/5 overall at eval    Time 12    Period Weeks    Status On-going      OT LONG TERM GOAL #2   Title Pt. will improve grip strength by 10# to open jars and containers.     Baseline difficulty at eval    Time 12    Period Weeks    Status On-going      OT LONG TERM GOAL #3   Title Patient will complete lower body dressing with modified independence.    Baseline min to mod assist at eval; sock aid supervision, MOD A doff R sock, IND doff L    Time 12    Period Weeks    Status Partially Met      OT LONG TERM GOAL #4   Title Patient to demonstrate the ability to vacuum  flooring at home with use of modified techniques with supervision.    Baseline difficulty at eval    Time 12    Period Weeks    Status On-going      OT LONG TERM GOAL #5   Title Patient will demonstrate the ability to retrieve a snack and transport it to the living room with modified independence.    Baseline unable at eval    Time 12    Period Weeks    Status On-going      OT LONG TERM GOAL #6   Title Patient will improve hand function to be able to perform cutting of food with modified independence.    Baseline wife helps to cut food when needed; VCs cutting theraputty    Time 8    Period Weeks    Status Partially Met      OT LONG TERM GOAL #7   Title Pt. will write a paragraph efficiently with 90% legibility.    Baseline decreased legibility and poor hand endurance for writing more than 1 sentence.    Time 8    Period Weeks    Status On-going      OT LONG TERM GOAL  #10   TITLE Pt will don and doff pants with supervision    Baseline min assist to manage pants    Time 12    Period Weeks    Status On-going                 Plan - 02/26/20 1146    Clinical Impression Statement Pt. reports no additional falls, and that he has resumed wearing shoes with solid grips on the bottoms when in the bathroom. Pt. plans to have a battery replaced for the deep brain stimulator the week of Thanksgiving. Pt. was able to demonstrate safe method for hiking pants while alternating with the right, and hand. Pt. required verbal cues, and reminders to avoid letting go of the walker to hike  the pants with bilateral hands. Pt. Continues to require CGA for fastening the pant button/snap, and for tucking in his shirt secondary to retropulsion when reaching posteriorly with bilateral hands.  Pt. Was able to complete buttoning faster, and more accurately without the buttonhook. Pt. Continues to work on improving ADL, and IADL functioning safely.    OT Occupational Profile and History Detailed Assessment- Review of Records and additional review of physical, cognitive, psychosocial history related to current functional performance    Occupational performance deficits (Please refer to evaluation for details): ADL's;IADL's;Leisure    Body Structure / Function / Physical Skills ADL;Coordination;Endurance;GMC;UE functional use;Balance;Decreased knowledge of use of DME;IADL;Pain;Dexterity;FMC;Strength;Mobility;ROM    Cognitive Skills Attention;Problem Solve;Memory    Psychosocial Skills Coping Strategies;Environmental  Adaptations;Habits;Routines and Behaviors    Rehab Potential Good    Clinical Decision Making Limited treatment options, no task modification necessary    Comorbidities Affecting Occupational Performance: Presence of comorbidities impacting occupational performance    Comorbidities impacting occupational performance description: risk for falls, slow to respond and process information at times, safety    Modification or Assistance to Complete Evaluation  No modification of tasks or assist necessary to complete eval    OT Frequency 2x / week    OT Duration 12 weeks    OT Treatment/Interventions Self-care/ADL training;Therapeutic exercise;DME and/or AE instruction;Energy conservation;Neuromuscular education;Patient/family education;Therapeutic activities;Moist Heat;Functional Mobility Training;Cognitive remediation/compensation    Consulted and Agree with Plan of Care Patient           Patient will  benefit from skilled therapeutic intervention in order to improve the following  deficits and impairments:   Body Structure / Function / Physical Skills: ADL, Coordination, Endurance, GMC, UE functional use, Balance, Decreased knowledge of use of DME, IADL, Pain, Dexterity, FMC, Strength, Mobility, ROM Cognitive Skills: Attention, Problem Solve, Memory Psychosocial Skills: Coping Strategies, Environmental  Adaptations, Habits, Routines and Behaviors   Visit Diagnosis: Muscle weakness (generalized)  Other lack of coordination    Problem List Patient Active Problem List   Diagnosis Date Noted  . Vertigo 02/19/2020  . Urine abnormality 02/19/2020  . E. coli bacteremia 02/23/2019  . Dysphagia 02/23/2019  . Aspiration pneumonia ([place]) 02/21/2019  . Closed right hip fracture ([place]) 02/16/2019  . Rib pain on right side 09/25/2018  . Skin sore 12/01/2017  . Hematoma 10/18/2017  . Fall at home 10/18/2017  . 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 Radicular pain in right arm 10/09/2016  . GERD (gastroesophageal reflux disease) 07/31/2016  . Trochanteric bursitis 03/03/2016  . Left hand pain 10/30/2015  . Fracture, finger, multiple sites 10/30/2015  . Colon cancer screening 07/23/2015  . Healthcare maintenance 07/23/2015  . Gout 02/18/2015  . Depression 01/06/2015  . Irritation of eyelid 08/20/2014  . Dupuytren's contracture 08/20/2014  . Cough 07/21/2014  . Lumbar stenosis with neurogenic claudication 06/13/2014  . Preop cardiovascular exam 05/28/2014  . S/P deep brain stimulator placement 05/08/2014  . Joint pain 01/22/2014  . Medicare annual wellness visit, initial 01/19/2014  . Advance care planning 01/19/2014  . Parkinson's disease ([place]) 12/13/2013  . PTSD (post-traumatic stress disorder) 06/13/2013  . Erectile dysfunction 06/13/2013  . HLD (hyperlipidemia) 06/13/2013  . Hip pain 06/10/2013  . Pain in joint, shoulder region 06/10/2013  . Right leg swelling 06/03/2013  . Essential hypertension 06/03/2013  . Bradycardia by electrocardiogram 06/03/2013  .  Obstructive sleep apnea 03/12/2013  . Spinal stenosis, lumbar region, with neurogenic claudication 12/14/2012    Harrel Carina, MS, OTR/L 02/26/2020, 11:54 AM  [place] MAIN [facility] SERVICES [address] [place], [place], [address] Phone: [phone]   Fax:  [phone]  Name: [name] MRN: [id number] Date of Birth: 1950-11-03

## 2020-02-26 NOTE — Therapy (Signed)
[place] MAIN [facility] SERVICES [address] [place], [place], [address] Phone: [phone]   Fax:  [phone]  Physical Therapy Treatment  Patient Details  Name: [name] MRN: [id number] Date of Birth: 09/07/1950 Referring Provider (PT): Sharolyn Douglas MD    Encounter Date: 02/26/2020   PT End of Session - 02/26/20 0928    Visit Number 31    Number of Visits 52    Date for PT Re-Evaluation 04/01/20    Authorization Type 1/10 PN 11/8    PT Start Time 0929    PT Stop Time 1013    PT Time Calculation (min) 44 min    Equipment Utilized During Treatment Gait belt    Activity Tolerance Patient tolerated treatment well    Behavior During Therapy [facility] for tasks assessed/performed           Past Medical History:  Diagnosis Date   Arthritis    Bradycardia    Cancer ([place]) 2013   skin cancer   Depression    ptsd   Dysrhythmia    chronic slow heart rate   GERD (gastroesophageal reflux disease)    Headache(784.0)    tension headaches non recent   History of chicken pox    History of kidney stones    passed   Hypertension    treated with HCTZ   Parkinson's disease ([place])    dx'ed 15 years ago   PTSD (post-traumatic stress disorder)    Shortness of breath dyspnea    Sleep apnea    doesn't use C-pap   Varicose veins     Past Surgical History:  Procedure Laterality Date   CHOLECYSTECTOMY N/A 10/22/2014   Procedure: LAPAROSCOPIC CHOLECYSTECTOMY WITH INTRAOPERATIVE CHOLANGIOGRAM;  Surgeon: Dia Crawford III, MD;  Location: [facility] ORS;  Service: General;  Laterality: N/A;   cyst removed      from lip as a child   INTRAMEDULLARY (IM) NAIL INTERTROCHANTERIC N/A 02/18/2019   Procedure: INTRAMEDULLARY (IM) NAIL INTERTROCHANTRIC, RIGHT,;  Surgeon: Thornton Park, MD;  Location: [facility] ORS;  Service: Orthopedics;  Laterality: N/A;   LUMBAR LAMINECTOMY/DECOMPRESSION MICRODISCECTOMY Bilateral 12/14/2012   Procedure: Bilateral  lumbar three-four, four-five decompressive laminotomy/foraminotomy;  Surgeon: Charlie Pitter, MD;  Location: [place] [facility] OR[facility];  Service: Neurosurgery;  Laterality: Bilateral;   PULSE GENERATOR IMPLANT Bilateral 12/13/2013   Procedure: Bilateral implantable pulse generator placement;  Surgeon: Erline Levine, MD;  Location: [place] [facility] OR[facility];  Service: Neurosurgery;  Laterality: Bilateral;  Bilateral implantable pulse generator placement   skin cancer removed     from ears,   12 lft arm  rt leg 15   SUBTHALAMIC STIMULATOR BATTERY REPLACEMENT Bilateral 07/14/2017   Procedure: BILATERAL IMPLANTED PULSE GENERATOR CHANGE FOR DEEP BRAIN STIMULATOR;  Surgeon: Erline Levine, MD;  Location: [place];  Service: Neurosurgery;  Laterality: Bilateral;   SUBTHALAMIC STIMULATOR INSERTION Bilateral 12/06/2013   Procedure: SUBTHALAMIC STIMULATOR INSERTION;  Surgeon: Erline Levine, MD;  Location: [place] [facility];  Service: Neurosurgery;  Laterality: Bilateral;  Bilateral deep brain stimulator placement    There were no vitals filed for this visit.   Subjective Assessment - 02/26/20 0943    Subjective Patient reports he forgot to do his VOR HEP from the vestibular screening. Reports no falls since his weekend of falling but is still having some low back pain.    Pertinent History [name] is a 69 y.o. male with a hx of HTN, bradycardia, Parkinson's s/p deep brain stimulator 11/2013 , HLD, depression,  possible seizure disorder, arthritis, depression/PTSD, dysrhythmia, GERD, sleep apnea. Patient was seen by this therapist in years past. Patient had a fall around Halloween of 2020 and admitted for ORIF on 02/18/19, was discharged to SNF and had [place] around Christmas. Had home health therapy for a month and a half. After that tried to go to Outpatient Therapy for 3 sessions at [facility] and was not pleased due to how busy and crowded the location was. Fell last week in bathroom, didn't take walker in, falls about 2x month. Is very  weak in RLE. Patient has a new lift chair and U drive walking. Has a caregiver 4 days a week. Has additional order for radiculopathy, lumbar region. PMH of lumbar laminectomy/decompression microdiscectomy (L3-4, 4-5)  In 2014.  Lumbar 4-5 cage +rod (2016).    Limitations Walking;Standing;Sitting;House hold activities;Lifting    How long can you sit comfortably? not limited with a backrest, without a back rest 3 minutes    How long can you stand comfortably? 3-5 minutes with holding on.    How long can you walk comfortably? with U step 6 minutes.    Patient Stated Goals pain reduction of R hip. strength of legs, dance with wife. push lawnmower.    Currently in Pain? Yes    Pain Score 5     Pain Location Back    Pain Orientation Lower    Pain Descriptors / Indicators Aching    Pain Type Acute pain    Pain Onset More than a month ago           Patient reports he forgot to do his VOR HEP.    [facility] PT Assessment - 02/26/20 0001      Standardized Balance Assessment   Standardized Balance Assessment Berg Balance Test      Berg Balance Test   Sit to Stand Able to stand without using hands and stabilize independently    Standing Unsupported Able to stand safely 2 minutes    Sitting with Back Unsupported but Feet Supported on Floor or Stool Able to sit safely and securely 2 minutes    Stand to Sit Sits safely with minimal use of hands    Transfers Able to transfer safely, minor use of hands    Standing Unsupported with Eyes Closed Able to stand 10 seconds with supervision    Standing Unsupported with Feet Together Able to place feet together independently and stand for 1 minute with supervision    From Standing, Reach Forward with Outstretched Arm Can reach confidently >25 cm (10")    From Standing Position, Pick up Object from Floor Able to pick up shoe, needs supervision    From Standing Position, Turn to Look Behind Over each Shoulder Looks behind from both sides and weight shifts well     Turn 360 Degrees Able to turn 360 degrees safely one side only in 4 seconds or less    Standing Unsupported, Alternately Place Feet on Step/Stool Able to stand independently and complete 8 steps >20 seconds    Standing Unsupported, One Foot in Front Able to plae foot ahead of the other independently and hold 30 seconds    Standing on One Leg Able to lift leg independently and hold 5-10 seconds    Total Score 49           Goals: BERG: 49/56  FOTO: 40.2 % 6 minute walk : 770 ft  VAS low back: 9/10 due to fall one time, now is 5-6/10 MODI:  54%     Treatment: VOR x1 seated  hip extension 30 second holds for hip flexor lengthening x2 trials each LE  Hamstring stretch x 60 seconds each leg.   R hip inferior short arc distractionwith belt for pain management. 30 seconds x 3 bouts.  R hiplateral short arc distraction with belt. 30 seconds x 3 bouts  10x STS with focus on equal weight shift     Pt educated throughout session about proper posture and technique with exercises. Improved exercise technique, movement at target joints, use of target muscles after min to mod verbal, visual, tactile cues              PT Education - 02/26/20 0927    Education provided Yes    Education Details exercise technique, goals    Person(s) Educated Patient;Caregiver(s)    Methods Explanation;Demonstration;Tactile cues;Verbal cues    Comprehension Verbalized understanding;Returned demonstration;Verbal cues required;Tactile cues required            PT Short Term Goals - 01/08/20 1024      PT SHORT TERM GOAL #1   Title Pt will perform HEP with family's/aide's supervision, for improved balance, transfers, and gait.    Baseline 5/5: HEP given 6/28: HEP compliant 8/23: aide helps with HEP    Time 4    Period Weeks    Status Achieved    Target Date 09/19/19             PT Long Term Goals - 02/26/20 0945      PT LONG TERM GOAL #1   Title Patient will increase Berg  Balance score by > 6 points (54/56) to demonstrate decreased fall risk during functional activities.    Baseline 10/20: 48/56 11/10: 49/56    Time 8    Period Weeks    Status Partially Met    Target Date 04/01/20      PT LONG TERM GOAL #2   Title Patient will ambulate within his household for ~ 5 minutes without an AD with no episodes of falling to increase independence with household mobility    Baseline 10/20: requires use of rollator to ambulate 11/10: requires AD    Time 8    Period Weeks    Status On-going    Target Date 04/01/20      PT LONG TERM GOAL #4   Title Patient will increase FOTO score to equal to or greater than 31/100  to demonstrate statistically significant improvement in mobility and quality of life.    Baseline 5/5: 12/100 6/28: 38% 8/23: 11.7 9/22: 45% 10/20: 47.9% 11/10: 40.2%    Time 8    Period Weeks    Status Achieved      PT LONG TERM GOAL #5   Title Patient will increase six minute walk test distance to >1000 for progression to community ambulator and improve gait ability    Baseline 6/28: 750 ft with U step rollator 8/23: 775 w rolling walker, increased crouch pattern of ambulation 9/22: deferred due to pain; 10/18: 819ft 11/10: 770 ft    Time 8    Period Weeks    Status On-going    Target Date 04/01/20      PT LONG TERM GOAL #6   Title Patient will increase Berg Balance score by > 6 points (42/56) to demonstrate decreased fall risk during functional activities.    Baseline 6/28: 36/56 8/23: 42/56 9/22 44/56 10/20: 48/56    Time 8    Period Weeks  Status Achieved      PT LONG TERM GOAL #7   Title Patient will report a worst pain of 3/10 on VAS in low back to improve tolerance with ADLs and reduced symptoms with activities.    Baseline 8/9: 9/10 8/23: 7/10 pain 9/22: 7/10 10/20: 5/10 11/10:  one episode of 9/10 when fell  the rest 4-6/10    Time 8    Period Weeks    Status Partially Met    Target Date 04/01/20      PT LONG TERM GOAL #8    Title Patient will reduce modified Oswestry score to <20 as to demonstrate minimal disability with ADLs including improved sleeping tolerance, walking/sitting tolerance etc for better mobility with ADLs.    Baseline 8/9: 64% 8/23: 66% 9/22:44% 10/20: 40% 11/20: 54%    Time 8    Period Weeks    Status Partially Met    Target Date 04/01/20                 Plan - 02/26/20 1317    Clinical Impression Statement Patient participated in goals this session, progressing with balance however regressing with duration of gait due to pain in hip/back from fall previous week. Patient is more guarded with movements due to pain in these areas. Patient would benefit from skilled physical therapy to increase strength, stability, balance, and decrease pain to improve quality of life    Personal Factors and Comorbidities Age;Comorbidity 3+;Past/Current Experience;Time since onset of injury/illness/exacerbation;Transportation    Comorbidities athritis, bradycardia, cancer, depression, dysrhythmia, GERD, HTN, Parkinson's, PTSD, SOB, sleep apnea    Examination-Activity Limitations Bathing;Bed Mobility;Caring for Others;Bend;Dressing;Lift;Locomotion Level;Reach Overhead;Sit;Squat;Stairs;Stand;Toileting;Transfers    Examination-Participation Restrictions Church;Cleaning;Community Activity;Interpersonal Relationship;Shop;Volunteer;Yard Work;Other    Stability/Clinical Decision Making Stable/Uncomplicated    Rehab Potential Fair    PT Frequency 2x / week    PT Duration 8 weeks    PT Treatment/Interventions ADLs/Self Care Home Management;Aquatic Therapy;Cryotherapy;Electrical Stimulation;Iontophoresis 4mg /ml Dexamethasone;Moist Heat;Ultrasound;Contrast Bath;DME Instruction;Gait training;Stair training;Functional mobility training;Therapeutic activities;Therapeutic exercise;Balance training;Neuromuscular re-education;Patient/family education;Manual techniques;Wheelchair mobility training;Energy conservation;Passive  range of motion;Traction;Orthotic Fit/Training;Dry needling;Vestibular;Visual/perceptual remediation/compensation;Taping;Canalith Repostioning    PT Next Visit Plan core and pain relief    PT Home Exercise Plan [place] and Agree with Plan of Care Patient;Family member/caregiver    Family Member Consulted wife           Patient will benefit from skilled therapeutic intervention in order to improve the following deficits and impairments:  Abnormal gait, Decreased balance, Decreased coordination, Decreased mobility, Impaired tone, Postural dysfunction, Decreased strength, Decreased safety awareness, Improper body mechanics, Impaired flexibility, Decreased activity tolerance, Decreased endurance, Decreased knowledge of precautions, Difficulty walking, Pain, Cardiopulmonary status limiting activity, Decreased range of motion, Impaired perceived functional ability  Visit Diagnosis: Muscle weakness (generalized)  Other lack of coordination  Unsteadiness on feet  Repeated falls     Problem List Patient Active Problem List   Diagnosis Date Noted   Vertigo 02/19/2020   Urine abnormality 02/19/2020   E. coli bacteremia 02/23/2019   Dysphagia 02/23/2019   Aspiration pneumonia ([place]) 02/21/2019   Closed right hip fracture ([place]) 02/16/2019   Rib pain on right side 09/25/2018   Skin sore 12/01/2017   Hematoma 10/18/2017   Fall at home 10/18/2017   REM behavioral disorder 11/02/2016   Radicular pain in right arm 10/09/2016   GERD (gastroesophageal reflux disease) 07/31/2016   Trochanteric bursitis 03/03/2016   Left hand pain 10/30/2015   Fracture, finger, multiple sites 10/30/2015   Colon  cancer screening 07/23/2015   Healthcare maintenance 07/23/2015   Gout 02/18/2015   Depression 01/06/2015   Irritation of eyelid 08/20/2014   Dupuytren's contracture 08/20/2014   Cough 07/21/2014   Lumbar stenosis with neurogenic claudication 06/13/2014    Preop cardiovascular exam 05/28/2014   S/P deep brain stimulator placement 05/08/2014   Joint pain 01/22/2014   Medicare annual wellness visit, initial 01/19/2014   Advance care planning 01/19/2014   Parkinson's disease ([place]) 12/13/2013   PTSD (post-traumatic stress disorder) 06/13/2013   Erectile dysfunction 06/13/2013   HLD (hyperlipidemia) 06/13/2013   Hip pain 06/10/2013   Pain in joint, shoulder region 06/10/2013   Right leg swelling 06/03/2013   Essential hypertension 06/03/2013   Bradycardia by electrocardiogram 06/03/2013   Obstructive sleep apnea 03/12/2013   Spinal stenosis, lumbar region, with neurogenic claudication 12/14/2012   Janna Arch, PT, DPT   02/26/2020, 1:18 PM  [place] [address] [place], [place], [address] Phone: [phone]   Fax:  [phone]  Name: [name] MRN: [id number] Date of Birth: October 17, 1950

## 2020-03-02 ENCOUNTER — Other Ambulatory Visit: Payer: Self-pay

## 2020-03-02 ENCOUNTER — Ambulatory Visit: Payer: Medicare PPO | Admitting: Occupational Therapy

## 2020-03-02 ENCOUNTER — Ambulatory Visit: Payer: Medicare PPO

## 2020-03-02 ENCOUNTER — Encounter: Payer: Self-pay | Admitting: Occupational Therapy

## 2020-03-02 ENCOUNTER — Ambulatory Visit (INDEPENDENT_AMBULATORY_CARE_PROVIDER_SITE_OTHER): Payer: Medicare PPO | Admitting: Psychology

## 2020-03-02 DIAGNOSIS — R278 Other lack of coordination: Secondary | ICD-10-CM | POA: Diagnosis not present

## 2020-03-02 DIAGNOSIS — R262 Difficulty in walking, not elsewhere classified: Secondary | ICD-10-CM | POA: Diagnosis not present

## 2020-03-02 DIAGNOSIS — M6281 Muscle weakness (generalized): Secondary | ICD-10-CM

## 2020-03-02 DIAGNOSIS — R2681 Unsteadiness on feet: Secondary | ICD-10-CM

## 2020-03-02 DIAGNOSIS — F4323 Adjustment disorder with mixed anxiety and depressed mood: Secondary | ICD-10-CM | POA: Diagnosis not present

## 2020-03-02 DIAGNOSIS — R296 Repeated falls: Secondary | ICD-10-CM | POA: Diagnosis not present

## 2020-03-02 DIAGNOSIS — F331 Major depressive disorder, recurrent, moderate: Secondary | ICD-10-CM | POA: Diagnosis not present

## 2020-03-02 DIAGNOSIS — R2689 Other abnormalities of gait and mobility: Secondary | ICD-10-CM | POA: Diagnosis not present

## 2020-03-02 DIAGNOSIS — M25551 Pain in right hip: Secondary | ICD-10-CM | POA: Diagnosis not present

## 2020-03-02 NOTE — Therapy (Addendum)
[place] MAIN [facility] SERVICES [address] [place], [place], [address] Phone: [phone]   Fax:  [phone]  Occupational Therapy Treatment  Patient Details  Name: [name] MRN: [id number] Date of Birth: 1950-04-22 No data recorded  Encounter Date: 03/02/2020   OT End of Session - 03/02/20 1109    Visit Number 16    Number of Visits 24    Date for OT Re-Evaluation 03/06/20    Authorization Type reporting period starting 02/05/2020    OT Start Time 1015    OT Stop Time 1100    OT Time Calculation (min) 45 min    Activity Tolerance Patient tolerated treatment well    Behavior During Therapy [facility] for tasks assessed/performed           Past Medical History:  Diagnosis Date  . Arthritis   . Bradycardia   . Cancer [facility]) 2013   skin cancer  . Depression    ptsd  . Dysrhythmia    chronic slow heart rate  . GERD (gastroesophageal reflux disease)   . Headache(784.0)    tension headaches non recent  . History of chicken pox   . History of kidney stones    passed  . Hypertension    treated with HCTZ  . Parkinson's disease ([place])    dx'ed 15 years ago  . PTSD (post-traumatic stress disorder)   . Shortness of breath dyspnea   . Sleep apnea    doesn't use C-pap  . Varicose veins     Past Surgical History:  Procedure Laterality Date  . CHOLECYSTECTOMY N/A 10/22/2014   Procedure: LAPAROSCOPIC CHOLECYSTECTOMY WITH INTRAOPERATIVE CHOLANGIOGRAM;  Surgeon: Dia Crawford III, MD;  Location: [facility] ORS;  Service: General;  Laterality: N/A;  . cyst removed      from lip as a child  . INTRAMEDULLARY (IM) NAIL INTERTROCHANTERIC N/A 02/18/2019   Procedure: INTRAMEDULLARY (IM) NAIL INTERTROCHANTRIC, RIGHT,;  Surgeon: Thornton Park, MD;  Location: [facility] ORS;  Service: Orthopedics;  Laterality: N/A;  . LUMBAR LAMINECTOMY/DECOMPRESSION MICRODISCECTOMY Bilateral 12/14/2012   Procedure: Bilateral lumbar three-four, four-five decompressive  laminotomy/foraminotomy;  Surgeon: Charlie Pitter, MD;  Location: [place] NEURO ORS;  Service: Neurosurgery;  Laterality: Bilateral;  . PULSE GENERATOR IMPLANT Bilateral 12/13/2013   Procedure: Bilateral implantable pulse generator placement;  Surgeon: Erline Levine, MD;  Location: [place] NEURO ORS;  Service: Neurosurgery;  Laterality: Bilateral;  Bilateral implantable pulse generator placement  . skin cancer removed     from ears,   12 lft arm  rt leg 15  . SUBTHALAMIC STIMULATOR BATTERY REPLACEMENT Bilateral 07/14/2017   Procedure: BILATERAL IMPLANTED PULSE GENERATOR CHANGE FOR DEEP BRAIN STIMULATOR;  Surgeon: Erline Levine, MD;  Location: [place];  Service: Neurosurgery;  Laterality: Bilateral;  . SUBTHALAMIC STIMULATOR INSERTION Bilateral 12/06/2013   Procedure: SUBTHALAMIC STIMULATOR INSERTION;  Surgeon: Erline Levine, MD;  Location: [place] NEURO ORS;  Service: Neurosurgery;  Laterality: Bilateral;  Bilateral deep brain stimulator placement    There were no vitals filed for this visit.   Subjective Assessment - 03/02/20 1108    Subjective  Pt. reports no more falls, and that he wears tennis shoes when standing now.    Patient is accompanied by: Family member    Pertinent History Pt. is a 69 yo male who has a history of Parkinson's Disease. Pt. has a Deep Brain Stimulator in place. Pt. has a history of Low Back Pain with 2 back surgeries in August 2014, and February 2016. Pt. has  a history of multiple frequent falls, unsure of how many falls in the last 6 months.    Limitations Frequent falls, limited motor control, and [place].     Pain Score 0-No pain          OT TREATMENT    Neuro muscular re-education:  Pt. worked on [facility] skills manipulating nuts, and bolts on a bolt board. Pt. worked on screwing, and unscrewing nuts, and bolts of varying sizes, and challenging progressively smaller items. Pt. worked on UE motor control, and coordination skills while handling, and using tools. Pt. worked on Adult nurse, IT trainer, and screw drivers to remove screws, and bolts of varying sizes.   Therapeutic Exercise:  Pt. brought in a pair [employer] light compression hand, and forearm sleeve that he has been using at home. Pt. was able to independently donn bilateral sleeves without difficulty.  Pt. plans to have a DBS procedure on 11/23. Pt.'s wife reports that she ordered a grab bar for the toilet, and that she is just waiting now for them to have it installed.  Pt. was able to independently donn, and doff his [employer] compression sleeves. Pt. reports that he is noticing a difference when using his [employer] sleeves, and that they are working to help the swelling. Pt. reports that they are also comfortable for sleeping in. Pt. was able to use the tools without difficulty, with the board positioned at the tabletop, however required increased time to complete. Pt. continues to work on improving UE strength, and [facility] skills in order to be able to participate in and perform meaningful ADLs, and IADL tasks.                    OT Education - 03/02/20 1109    Education provided Yes    Education Details A/E use, left hand strength    Person(s) Educated Patient;Spouse    Methods Explanation    Comprehension Verbalized understanding               OT Long Term Goals - 02/10/20 1203      OT LONG TERM GOAL #1   Title Pt. will improve Bilateral UE strength by 1 mm grade to assist with ADLs, and IADLs    Baseline BUE strength 4/5 overall at eval    Time 12    Period Weeks    Status On-going      OT LONG TERM GOAL #2   Title Pt. will improve grip strength by 10# to open jars and containers.    Baseline difficulty at eval    Time 12    Period Weeks    Status On-going      OT LONG TERM GOAL #3   Title Patient will complete lower body dressing with modified independence.    Baseline min to mod assist at eval; sock aid supervision, MOD A doff R sock, IND doff L    Time 12     Period Weeks    Status Partially Met      OT LONG TERM GOAL #4   Title Patient to demonstrate the ability to vacuum flooring at home with use of modified techniques with supervision.    Baseline difficulty at eval    Time 12    Period Weeks    Status On-going      OT LONG TERM GOAL #5   Title Patient will demonstrate the ability to retrieve a snack and transport it to the  living room with modified independence.    Baseline unable at eval    Time 12    Period Weeks    Status On-going      OT LONG TERM GOAL #6   Title Patient will improve hand function to be able to perform cutting of food with modified independence.    Baseline wife helps to cut food when needed; VCs cutting theraputty    Time 8    Period Weeks    Status Partially Met      OT LONG TERM GOAL #7   Title Pt. will write a paragraph efficiently with 90% legibility.    Baseline decreased legibility and poor hand endurance for writing more than 1 sentence.    Time 8    Period Weeks    Status On-going      OT LONG TERM GOAL  #10   TITLE Pt will don and doff pants with supervision    Baseline min assist to manage pants    Time 12    Period Weeks    Status On-going                 Plan - 03/02/20 1110    Clinical Impression Statement Pt. plans to have a DBS procedure on 11/23. Pt.'s wife reports that she ordered a grab bar for the toilet, and that she is just waiting now for them to have it installed.  Pt. was able to independently donn, and doff his [employer] compression sleeves. Pt. reports that he is noticing a difference when using his [employer] sleeves, and that they are working to help the swelling. Pt. reports that they are also comfortable for sleeping in. Pt. was able to use the tools without difficulty, with the board positioned at the tabletop, however required increased time to complete. Pt. continues to work on improving UE strength, and [facility] skills in order to be able to participate in and perform  meaningful ADLs, and IADL tasks.     OT Occupational Profile and History Detailed Assessment- Review of Records and additional review of physical, cognitive, psychosocial history related to current functional performance    Occupational performance deficits (Please refer to evaluation for details): ADL's;IADL's;Leisure    Body Structure / Function / Physical Skills ADL;Coordination;Endurance;GMC;UE functional use;Balance;Decreased knowledge of use of DME;IADL;Pain;Dexterity;FMC;Strength;Mobility;ROM    Cognitive Skills Attention;Problem Solve;Memory    Psychosocial Skills Coping Strategies;Environmental  Adaptations;Habits;Routines and Behaviors    Rehab Potential Good    Clinical Decision Making Limited treatment options, no task modification necessary    Comorbidities Affecting Occupational Performance: Presence of comorbidities impacting occupational performance    Comorbidities impacting occupational performance description: risk for falls, slow to respond and process information at times, safety    Modification or Assistance to Complete Evaluation  No modification of tasks or assist necessary to complete eval    OT Frequency 2x / week    OT Duration 12 weeks    OT Treatment/Interventions Self-care/ADL training;Therapeutic exercise;DME and/or AE instruction;Energy conservation;Neuromuscular education;Patient/family education;Therapeutic activities;Moist Heat;Functional Mobility Training;Cognitive remediation/compensation    Consulted and Agree with Plan of Care Patient           Patient will benefit from skilled therapeutic intervention in order to improve the following deficits and impairments:   Body Structure / Function / Physical Skills: ADL, Coordination, Endurance, GMC, UE functional use, Balance, Decreased knowledge of use of DME, IADL, Pain, Dexterity, FMC, Strength, Mobility, ROM Cognitive Skills: Attention, Problem Solve, Memory Psychosocial Skills: Coping Strategies,  Environmental  Adaptations, Habits, Routines and Behaviors   Visit Diagnosis: Muscle weakness (generalized)  Other lack of coordination    Problem List Patient Active Problem List   Diagnosis Date Noted  . Vertigo 02/19/2020  . Urine abnormality 02/19/2020  . E. coli bacteremia 02/23/2019  . Dysphagia 02/23/2019  . Aspiration pneumonia ([place]) 02/21/2019  . Closed right hip fracture ([place]) 02/16/2019  . Rib pain on right side 09/25/2018  . Skin sore 12/01/2017  . Hematoma 10/18/2017  . Fall at home 10/18/2017  . 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 Radicular pain in right arm 10/09/2016  . GERD (gastroesophageal reflux disease) 07/31/2016  . Trochanteric bursitis 03/03/2016  . Left hand pain 10/30/2015  . Fracture, finger, multiple sites 10/30/2015  . Colon cancer screening 07/23/2015  . Healthcare maintenance 07/23/2015  . Gout 02/18/2015  . Depression 01/06/2015  . Irritation of eyelid 08/20/2014  . Dupuytren's contracture 08/20/2014  . Cough 07/21/2014  . Lumbar stenosis with neurogenic claudication 06/13/2014  . Preop cardiovascular exam 05/28/2014  . S/P deep brain stimulator placement 05/08/2014  . Joint pain 01/22/2014  . Medicare annual wellness visit, initial 01/19/2014  . Advance care planning 01/19/2014  . Parkinson's disease ([place]) 12/13/2013  . PTSD (post-traumatic stress disorder) 06/13/2013  . Erectile dysfunction 06/13/2013  . HLD (hyperlipidemia) 06/13/2013  . Hip pain 06/10/2013  . Pain in joint, shoulder region 06/10/2013  . Right leg swelling 06/03/2013  . Essential hypertension 06/03/2013  . Bradycardia by electrocardiogram 06/03/2013  . Obstructive sleep apnea 03/12/2013  . Spinal stenosis, lumbar region, with neurogenic claudication 12/14/2012    Harrel Carina, MS, OTR/L 03/02/2020, 11:15 AM  [place] MAIN [facility] SERVICES [place], [place], [address] Phone: [phone]   Fax:   [phone]  Name: [name] MRN: [id number] Date of Birth: 08-Nov-1950

## 2020-03-02 NOTE — Therapy (Signed)
[place] [facility] SERVICES [address] [place], [place], [address] Phone: [phone]   Fax:  [phone]  Physical Therapy Treatment  Patient Details  Name: [name] MRN: [id number] Date of Birth: [birth year] Referring Provider (PT): Sharolyn Douglas MD    Encounter Date: 03/02/2020   PT End of Session - 03/02/20 1248    Visit Number 32    Number of Visits 52    Date for PT Re-Evaluation 04/01/20    Authorization Type 2/10 PN 11/8    PT Start Time 0930    PT Stop Time 1014    PT Time Calculation (min) 44 min    Equipment Utilized During Treatment Gait belt    Activity Tolerance Patient tolerated treatment well    Behavior During Therapy [facility] for tasks assessed/performed           Past Medical History:  Diagnosis Date   Arthritis    Bradycardia    Cancer ([place]) 2013   skin cancer   Depression    ptsd   Dysrhythmia    chronic slow heart rate   GERD (gastroesophageal reflux disease)    Headache(784.0)    tension headaches non recent   History of chicken pox    History of kidney stones    passed   Hypertension    treated with HCTZ   Parkinson's disease ([place])    dx'ed 15 years ago   PTSD (post-traumatic stress disorder)    Shortness of breath dyspnea    Sleep apnea    doesn't use C-pap   Varicose veins     Past Surgical History:  Procedure Laterality Date   CHOLECYSTECTOMY N/A 10/22/2014   Procedure: LAPAROSCOPIC CHOLECYSTECTOMY WITH INTRAOPERATIVE CHOLANGIOGRAM;  Surgeon: Dia Crawford III, MD;  Location: [facility] ORS;  Service: General;  Laterality: N/A;   cyst removed      from lip as a child   INTRAMEDULLARY (IM) NAIL INTERTROCHANTERIC N/A 02/18/2019   Procedure: INTRAMEDULLARY (IM) NAIL INTERTROCHANTRIC, RIGHT,;  Surgeon: Thornton Park, MD;  Location: [facility] ORS;  Service: Orthopedics;  Laterality: N/A;   LUMBAR LAMINECTOMY/DECOMPRESSION MICRODISCECTOMY Bilateral 12/14/2012   Procedure: Bilateral  lumbar three-four, four-five decompressive laminotomy/foraminotomy;  Surgeon: Charlie Pitter, MD;  Location: [place] [facility];  Service: Neurosurgery;  Laterality: Bilateral;   PULSE GENERATOR IMPLANT Bilateral 12/13/2013   Procedure: Bilateral implantable pulse generator placement;  Surgeon: Erline Levine, MD;  Location: [place] [facility];  Service: Neurosurgery;  Laterality: Bilateral;  Bilateral implantable pulse generator placement   skin cancer removed     from ears,   12 lft arm  rt leg 15   SUBTHALAMIC STIMULATOR BATTERY REPLACEMENT Bilateral 07/14/2017   Procedure: BILATERAL IMPLANTED PULSE GENERATOR CHANGE FOR DEEP BRAIN STIMULATOR;  Surgeon: Erline Levine, MD;  Location: [place];  Service: Neurosurgery;  Laterality: Bilateral;   SUBTHALAMIC STIMULATOR INSERTION Bilateral 12/06/2013   Procedure: SUBTHALAMIC STIMULATOR INSERTION;  Surgeon: Erline Levine, MD;  Location: [place] [facility];  Service: Neurosurgery;  Laterality: Bilateral;  Bilateral deep brain stimulator placement    There were no vitals filed for this visit.   Subjective Assessment - 03/02/20 1244    Subjective Patient reports he had one fall yesterday but was able to get back up on his own. Is getting his deep brain stimulator fixed. Reports compliance with HEP.    Pertinent History [name] is a 69 y.o. male with a hx of HTN, bradycardia, Parkinson's s/p deep brain stimulator 11/2013 , HLD, depression, possible  seizure disorder, arthritis, depression/PTSD, dysrhythmia, GERD, sleep apnea. Patient was seen by this therapist in years past. Patient had a fall around Halloween of 2020 and admitted for ORIF on 02/18/19, was discharged to SNF and had [place] around Christmas. Had home health therapy for a month and a half. After that tried to go to Outpatient Therapy for 3 sessions at [facility] and was not pleased due to how busy and crowded the location was. Fell last week in bathroom, didn't take walker in, falls about 2x month. Is very weak in  RLE. Patient has a new lift chair and U drive walking. Has a caregiver 4 days a week. Has additional order for radiculopathy, lumbar region. PMH of lumbar laminectomy/decompression microdiscectomy (L3-4, 4-5)  In 2014.  Lumbar 4-5 cage +rod (2016).    Limitations Walking;Standing;Sitting;House hold activities;Lifting    How long can you sit comfortably? not limited with a backrest, without a back rest 3 minutes    How long can you stand comfortably? 3-5 minutes with holding on.    How long can you walk comfortably? with U step 6 minutes.    Patient Stated Goals pain reduction of R hip. strength of legs, dance with wife. push lawnmower.    Currently in Pain? No/denies    Pain Onset --                  Neuro Re-ed: Cues for sequencing, safety, with close CGA Standing at Bar: Step up 6" step throw ball at SPT with CGA from PT, 10x each LE step up Forward/backwards big step ambulation 8x length of // bars Speed ladder: one foot each square, cues for widening BOS x 10 lengths of // bars airex pad; 6 inch step airex pad: lateral step up/down BUE support 10x each side  Step over green theraband and back 10x each LE, step and clap close CGA due to near LOB.  Step over orange hurdle and back 10x each LE  TherEx: cues for sequencing, body mechanics Precor bilateral leg press 115#, #130  12 x 3 sets.  precor single leg press #55, 12x each LE   Pt educated throughout session about proper posture and technique with exercises. Improved exercise technique, movement at target joints, use of target muscles after min to mod verbal, visual, tactile cues.                          PT Education - 03/02/20 1247    Education provided Yes    Education Details exercise technique, body mechanics    Person(s) Educated Patient    Methods Explanation;Demonstration;Tactile cues;Verbal cues    Comprehension Verbalized understanding;Returned demonstration;Verbal cues required;Tactile cues  required            PT Short Term Goals - 01/08/20 1024      PT SHORT TERM GOAL #1   Title Pt will perform HEP with family's/aide's supervision, for improved balance, transfers, and gait.    Baseline 5/5: HEP given 6/28: HEP compliant 8/23: aide helps with HEP    Time 4    Period Weeks    Status Achieved    Target Date 09/19/19             PT Long Term Goals - 02/26/20 0945      PT LONG TERM GOAL #1   Title Patient will increase Berg Balance score by > 6 points (54/56) to demonstrate decreased fall risk during functional activities.  Baseline 10/20: 48/56 11/10: 49/56    Time 8    Period Weeks    Status Partially Met    Target Date 04/01/20      PT LONG TERM GOAL #2   Title Patient will ambulate within his household for ~ 5 minutes without an AD with no episodes of falling to increase independence with household mobility    Baseline 10/20: requires use of rollator to ambulate 11/10: requires AD    Time 8    Period Weeks    Status On-going    Target Date 04/01/20      PT LONG TERM GOAL #4   Title Patient will increase FOTO score to equal to or greater than 31/100  to demonstrate statistically significant improvement in mobility and quality of life.    Baseline 5/5: 12/100 6/28: 38% 8/23: 11.7 9/22: 45% 10/20: 47.9% 11/10: 40.2%    Time 8    Period Weeks    Status Achieved      PT LONG TERM GOAL #5   Title Patient will increase six minute walk test distance to >1000 for progression to community ambulator and improve gait ability    Baseline 6/28: 750 ft with U step rollator 8/23: 775 w rolling walker, increased crouch pattern of ambulation 9/22: deferred due to pain; 10/18: 872ft 11/10: 770 ft    Time 8    Period Weeks    Status On-going    Target Date 04/01/20      PT LONG TERM GOAL #6   Title Patient will increase Berg Balance score by > 6 points (42/56) to demonstrate decreased fall risk during functional activities.    Baseline 6/28: 36/56 8/23: 42/56 9/22  44/56 10/20: 48/56    Time 8    Period Weeks    Status Achieved      PT LONG TERM GOAL #7   Title Patient will report a worst pain of 3/10 on VAS in low back to improve tolerance with ADLs and reduced symptoms with activities.    Baseline 8/9: 9/10 8/23: 7/10 pain 9/22: 7/10 10/20: 5/10 11/10:  one episode of 9/10 when fell  the rest 4-6/10    Time 8    Period Weeks    Status Partially Met    Target Date 04/01/20      PT LONG TERM GOAL #8   Title Patient will reduce modified Oswestry score to <20 as to demonstrate minimal disability with ADLs including improved sleeping tolerance, walking/sitting tolerance etc for better mobility with ADLs.    Baseline 8/9: 64% 8/23: 66% 9/22:44% 10/20: 40% 11/20: 54%    Time 8    Period Weeks    Status Partially Met    Target Date 04/01/20                 Plan - 03/02/20 1252    Clinical Impression Statement Patient continues to progress with functional mobility, stabilization, and gait mechanics with excellent motivation despite fatigue. Patient does requires occasional seated rest breaks due to fatigue but is able to maintain positive attitude and eagerness for participation. LE strength is progressing with increased resistance tolerated from 115 to 130 with leg press. Patient would benefit from skilled physical therapy to increase strength, stability, balance, and decrease pain to improve quality of life    Personal Factors and Comorbidities Age;Comorbidity 3+;Past/Current Experience;Time since onset of injury/illness/exacerbation;Transportation    Comorbidities athritis, bradycardia, cancer, depression, dysrhythmia, GERD, HTN, Parkinson's, PTSD, SOB, sleep apnea  Examination-Activity Limitations Bathing;Bed Mobility;Caring for Others;Bend;Dressing;Lift;Locomotion Level;Reach Overhead;Sit;Squat;Stairs;Stand;Toileting;Transfers    Examination-Participation Restrictions Church;Cleaning;Community Activity;Interpersonal  Relationship;Shop;Volunteer;Yard Work;Other    Stability/Clinical Decision Making Stable/Uncomplicated    Rehab Potential Fair    PT Frequency 2x / week    PT Duration 8 weeks    PT Treatment/Interventions ADLs/Self Care Home Management;Aquatic Therapy;Cryotherapy;Electrical Stimulation;Iontophoresis 4mg /ml Dexamethasone;Moist Heat;Ultrasound;Contrast Bath;DME Instruction;Gait training;Stair training;Functional mobility training;Therapeutic activities;Therapeutic exercise;Balance training;Neuromuscular re-education;Patient/family education;Manual techniques;Wheelchair mobility training;Energy conservation;Passive range of motion;Traction;Orthotic Fit/Training;Dry needling;Vestibular;Visual/perceptual remediation/compensation;Taping;Canalith Repostioning    PT Next Visit Plan core and pain relief    PT Home Exercise Plan [place] and Agree with Plan of Care Patient;Family member/caregiver    Family Member Consulted wife           Patient will benefit from skilled therapeutic intervention in order to improve the following deficits and impairments:  Abnormal gait, Decreased balance, Decreased coordination, Decreased mobility, Impaired tone, Postural dysfunction, Decreased strength, Decreased safety awareness, Improper body mechanics, Impaired flexibility, Decreased activity tolerance, Decreased endurance, Decreased knowledge of precautions, Difficulty walking, Pain, Cardiopulmonary status limiting activity, Decreased range of motion, Impaired perceived functional ability  Visit Diagnosis: Muscle weakness (generalized)  Other lack of coordination  Unsteadiness on feet  Repeated falls     Problem List Patient Active Problem List   Diagnosis Date Noted   Vertigo 02/19/2020   Urine abnormality 02/19/2020   E. coli bacteremia 02/23/2019   Dysphagia 02/23/2019   Aspiration pneumonia ([place]) 02/21/2019   Closed right hip fracture (Home) 02/16/2019   Rib pain on right side  09/25/2018   Skin sore 12/01/2017   Hematoma 10/18/2017   Fall at home 10/18/2017   REM behavioral disorder 11/02/2016   Radicular pain in right arm 10/09/2016   GERD (gastroesophageal reflux disease) 07/31/2016   Trochanteric bursitis 03/03/2016   Left hand pain 10/30/2015   Fracture, finger, multiple sites 10/30/2015   Colon cancer screening 07/23/2015   Healthcare maintenance 07/23/2015   Gout 02/18/2015   Depression 01/06/2015   Irritation of eyelid 08/20/2014   Dupuytren's contracture 08/20/2014   Cough 07/21/2014   Lumbar stenosis with neurogenic claudication 06/13/2014   Preop cardiovascular exam 05/28/2014   S/P deep brain stimulator placement 05/08/2014   Joint pain 01/22/2014   Medicare annual wellness visit, initial 01/19/2014   Advance care planning 01/19/2014   Parkinson's disease ([place]) 12/13/2013   PTSD (post-traumatic stress disorder) 06/13/2013   Erectile dysfunction 06/13/2013   HLD (hyperlipidemia) 06/13/2013   Hip pain 06/10/2013   Pain in joint, shoulder region 06/10/2013   Right leg swelling 06/03/2013   Essential hypertension 06/03/2013   Bradycardia by electrocardiogram 06/03/2013   Obstructive sleep apnea 03/12/2013   Spinal stenosis, lumbar region, with neurogenic claudication 12/14/2012   Janna Arch, PT, DPT   03/02/2020, 12:53 PM  [place] [address] [place], [place], [address] Phone: [phone]   Fax:  [phone]  Name: [name] MRN: [id number] Date of Birth: [birth year]

## 2020-03-04 ENCOUNTER — Encounter: Payer: Self-pay | Admitting: Occupational Therapy

## 2020-03-04 ENCOUNTER — Other Ambulatory Visit: Payer: Self-pay

## 2020-03-04 ENCOUNTER — Ambulatory Visit: Payer: Medicare PPO | Admitting: Occupational Therapy

## 2020-03-04 ENCOUNTER — Ambulatory Visit: Payer: Medicare PPO

## 2020-03-04 DIAGNOSIS — M25551 Pain in right hip: Secondary | ICD-10-CM | POA: Diagnosis not present

## 2020-03-04 DIAGNOSIS — R278 Other lack of coordination: Secondary | ICD-10-CM | POA: Diagnosis not present

## 2020-03-04 DIAGNOSIS — M6281 Muscle weakness (generalized): Secondary | ICD-10-CM

## 2020-03-04 DIAGNOSIS — R2689 Other abnormalities of gait and mobility: Secondary | ICD-10-CM | POA: Diagnosis not present

## 2020-03-04 DIAGNOSIS — R296 Repeated falls: Secondary | ICD-10-CM | POA: Diagnosis not present

## 2020-03-04 DIAGNOSIS — R2681 Unsteadiness on feet: Secondary | ICD-10-CM | POA: Diagnosis not present

## 2020-03-04 DIAGNOSIS — R262 Difficulty in walking, not elsewhere classified: Secondary | ICD-10-CM | POA: Diagnosis not present

## 2020-03-04 NOTE — Therapy (Signed)
[place] MAIN [facility] SERVICES [address] [place], [place], [address] Phone: [phone]   Fax:  [phone]  Physical Therapy Treatment  Patient Details  Name: [name] MRN: [id number] Date of Birth: 05/07/1950 Referring Provider (PT): Sharolyn Douglas MD    Encounter Date: 03/04/2020   PT End of Session - 03/04/20 1004    Visit Number 33    Number of Visits 52    Date for PT Re-Evaluation 04/01/20    Authorization Type 3/10 PN 11/8    PT Start Time 0929    PT Stop Time 1013    PT Time Calculation (min) 44 min    Equipment Utilized During Treatment Gait belt    Activity Tolerance Patient tolerated treatment well    Behavior During Therapy [facility] for tasks assessed/performed           Past Medical History:  Diagnosis Date   Arthritis    Bradycardia    Cancer ([place]) 2013   skin cancer   Depression    ptsd   Dysrhythmia    chronic slow heart rate   GERD (gastroesophageal reflux disease)    Headache(784.0)    tension headaches non recent   History of chicken pox    History of kidney stones    passed   Hypertension    treated with HCTZ   Parkinson's disease ([place])    dx'ed 15 years ago   PTSD (post-traumatic stress disorder)    Shortness of breath dyspnea    Sleep apnea    doesn't use C-pap   Varicose veins     Past Surgical History:  Procedure Laterality Date   CHOLECYSTECTOMY N/A 10/22/2014   Procedure: LAPAROSCOPIC CHOLECYSTECTOMY WITH INTRAOPERATIVE CHOLANGIOGRAM;  Surgeon: Dia Crawford III, MD;  Location: [facility] ORS;  Service: General;  Laterality: N/A;   cyst removed      from lip as a child   INTRAMEDULLARY (IM) NAIL INTERTROCHANTERIC N/A 02/18/2019   Procedure: INTRAMEDULLARY (IM) NAIL INTERTROCHANTRIC, RIGHT,;  Surgeon: Thornton Park, MD;  Location: [facility] ORS;  Service: Orthopedics;  Laterality: N/A;   LUMBAR LAMINECTOMY/DECOMPRESSION MICRODISCECTOMY Bilateral 12/14/2012   Procedure: Bilateral  lumbar three-four, four-five decompressive laminotomy/foraminotomy;  Surgeon: Charlie Pitter, MD;  Location: [place] NEURO ORS;  Service: Neurosurgery;  Laterality: Bilateral;   PULSE GENERATOR IMPLANT Bilateral 12/13/2013   Procedure: Bilateral implantable pulse generator placement;  Surgeon: Erline Levine, MD;  Location: [place] NEURO ORS;  Service: Neurosurgery;  Laterality: Bilateral;  Bilateral implantable pulse generator placement   skin cancer removed     from ears,   12 lft arm  rt leg 15   SUBTHALAMIC STIMULATOR BATTERY REPLACEMENT Bilateral 07/14/2017   Procedure: BILATERAL IMPLANTED PULSE GENERATOR CHANGE FOR DEEP BRAIN STIMULATOR;  Surgeon: Erline Levine, MD;  Location: [place];  Service: Neurosurgery;  Laterality: Bilateral;   SUBTHALAMIC STIMULATOR INSERTION Bilateral 12/06/2013   Procedure: SUBTHALAMIC STIMULATOR INSERTION;  Surgeon: Erline Levine, MD;  Location: [place] NEURO ORS;  Service: Neurosurgery;  Laterality: Bilateral;  Bilateral deep brain stimulator placement    There were no vitals filed for this visit.   Subjective Assessment - 03/04/20 0933    Subjective Patient reports his back is doing well today. No falls or LOB since last session. Is getting his DBS surgery next week.    Pertinent History DANTON PALMATEER is a 69 y.o. male with a hx of HTN, bradycardia, Parkinson's s/p deep brain stimulator 11/2013 , HLD, depression, possible seizure disorder, arthritis, depression/PTSD, dysrhythmia, GERD,  sleep apnea. Patient was seen by this therapist in years past. Patient had a fall around Halloween of 2020 and admitted for ORIF on 02/18/19, was discharged to SNF and had Toyah around Christmas. Had home health therapy for a month and a half. After that tried to go to Outpatient Therapy for 3 sessions at [facility] and was not pleased due to how busy and crowded the location was. Fell last week in bathroom, didn't take walker in, falls about 2x month. Is very weak in RLE. Patient has a new lift chair  and U drive walking. Has a caregiver 4 days a week. Has additional order for radiculopathy, lumbar region. PMH of lumbar laminectomy/decompression microdiscectomy (L3-4, 4-5)  In 2014.  Lumbar 4-5 cage +rod (2016).    Limitations Walking;Standing;Sitting;House hold activities;Lifting    How long can you sit comfortably? not limited with a backrest, without a back rest 3 minutes    How long can you stand comfortably? 3-5 minutes with holding on.    How long can you walk comfortably? with U step 6 minutes.    Patient Stated Goals pain reduction of R hip. strength of legs, dance with wife. push lawnmower.    Currently in Pain? No/denies                  Neuro Re-ed: Cues for sequencing, safety, with close CGA Standing at Bar: Step up 6" step throw ball at SPT with CGA from PT, 10x each LE step up airex pad: throw football back and forth with CGA and cues for upright posture 20 tosses.  [place]vine 4x length of // bars with BUE support and cues for sequencing.  Forward/backwards big step ambulation 8x length of // bars with 2x/4 between feet with decreasing UE support to no UE support  Speed ladder: one foot each square, cues for widening BOS x 10 lengths of // bars airex pad; 6 inch step: modified tandem stance 2x 60 second holds each position  Step over green theraband and back 10x each LE, step and clap close CGA due to near LOB.     TherEx: cues for sequencing, body mechanics Precor bilateral leg press , #130  12 x 2 sets.  precor single leg press #55, 12x each LE    Pt educated throughout session about proper posture and technique with exercises. Improved exercise technique, movement at target joints, use of target muscles after min to mod verbal, visual, tactile cues.                       PT Education - 03/04/20 [phone]    Education provided Yes    Education Details exercise technique, body mechanics    Person(s) Educated Patient    Methods  Explanation;Demonstration;Tactile cues;Verbal cues    Comprehension Verbalized understanding;Returned demonstration;Verbal cues required;Tactile cues required            PT Short Term Goals - 01/08/20 1024      PT SHORT TERM GOAL #1   Title Pt will perform HEP with family's/aide's supervision, for improved balance, transfers, and gait.    Baseline 5/5: HEP given 6/28: HEP compliant 8/23: aide helps with HEP    Time 4    Period Weeks    Status Achieved    Target Date 09/19/19             PT Long Term Goals - 02/26/20 0945      PT LONG TERM GOAL #1  Title Patient will increase Berg Balance score by > 6 points (54/56) to demonstrate decreased fall risk during functional activities.    Baseline 10/20: 48/56 11/10: 49/56    Time 8    Period Weeks    Status Partially Met    Target Date 04/01/20      PT LONG TERM GOAL #2   Title Patient will ambulate within his household for ~ 5 minutes without an AD with no episodes of falling to increase independence with household mobility    Baseline 10/20: requires use of rollator to ambulate 11/10: requires AD    Time 8    Period Weeks    Status On-going    Target Date 04/01/20      PT LONG TERM GOAL #4   Title Patient will increase FOTO score to equal to or greater than 31/100  to demonstrate statistically significant improvement in mobility and quality of life.    Baseline 5/5: 12/100 6/28: 38% 8/23: 11.7 9/22: 45% 10/20: 47.9% 11/10: 40.2%    Time 8    Period Weeks    Status Achieved      PT LONG TERM GOAL #5   Title Patient will increase six minute walk test distance to >1000 for progression to community ambulator and improve gait ability    Baseline 6/28: 750 ft with U step rollator 8/23: 775 w rolling walker, increased crouch pattern of ambulation 9/22: deferred due to pain; 10/18: 847ft 11/10: 770 ft    Time 8    Period Weeks    Status On-going    Target Date 04/01/20      PT LONG TERM GOAL #6   Title Patient will  increase Berg Balance score by > 6 points (42/56) to demonstrate decreased fall risk during functional activities.    Baseline 6/28: 36/56 8/23: 42/56 9/22 44/56 10/20: 48/56    Time 8    Period Weeks    Status Achieved      PT LONG TERM GOAL #7   Title Patient will report a worst pain of 3/10 on VAS in low back to improve tolerance with ADLs and reduced symptoms with activities.    Baseline 8/9: 9/10 8/23: 7/10 pain 9/22: 7/10 10/20: 5/10 11/10:  one episode of 9/10 when fell  the rest 4-6/10    Time 8    Period Weeks    Status Partially Met    Target Date 04/01/20      PT LONG TERM GOAL #8   Title Patient will reduce modified Oswestry score to <20 as to demonstrate minimal disability with ADLs including improved sleeping tolerance, walking/sitting tolerance etc for better mobility with ADLs.    Baseline 8/9: 64% 8/23: 66% 9/22:44% 10/20: 40% 11/20: 54%    Time 8    Period Weeks    Status Partially Met    Target Date 04/01/20                 Plan - 03/04/20 1010    Clinical Impression Statement Patient is improving with mobility and stability. He tolerates increased repetition of increased weight during leg press and cross body interventions this session. Patient introduced to grapevine which is challenging at this time. Patient would benefit from skilled physical therapy to increase strength, stability, balance, and decrease pain to improve quality of life    Personal Factors and Comorbidities Age;Comorbidity 3+;Past/Current Experience;Time since onset of injury/illness/exacerbation;Transportation    Comorbidities athritis, bradycardia, cancer, depression, dysrhythmia, GERD, HTN, Parkinson's, PTSD, SOB, sleep  apnea    Examination-Activity Limitations Bathing;Bed Mobility;Caring for Others;Bend;Dressing;Lift;Locomotion Level;Reach Overhead;Sit;Squat;Stairs;Stand;Toileting;Transfers    Examination-Participation Restrictions Church;Cleaning;Community Activity;Interpersonal  Relationship;Shop;Volunteer;Yard Work;Other    Stability/Clinical Decision Making Stable/Uncomplicated    Rehab Potential Fair    PT Frequency 2x / week    PT Duration 8 weeks    PT Treatment/Interventions ADLs/Self Care Home Management;Aquatic Therapy;Cryotherapy;Electrical Stimulation;Iontophoresis 4mg /ml Dexamethasone;Moist Heat;Ultrasound;Contrast Bath;DME Instruction;Gait training;Stair training;Functional mobility training;Therapeutic activities;Therapeutic exercise;Balance training;Neuromuscular re-education;Patient/family education;Manual techniques;Wheelchair mobility training;Energy conservation;Passive range of motion;Traction;Orthotic Fit/Training;Dry needling;Vestibular;Visual/perceptual remediation/compensation;Taping;Canalith Repostioning    PT Next Visit Plan core and pain relief    PT Home Exercise Plan [place] and Agree with Plan of Care Patient;Family member/caregiver    Family Member Consulted wife           Patient will benefit from skilled therapeutic intervention in order to improve the following deficits and impairments:  Abnormal gait, Decreased balance, Decreased coordination, Decreased mobility, Impaired tone, Postural dysfunction, Decreased strength, Decreased safety awareness, Improper body mechanics, Impaired flexibility, Decreased activity tolerance, Decreased endurance, Decreased knowledge of precautions, Difficulty walking, Pain, Cardiopulmonary status limiting activity, Decreased range of motion, Impaired perceived functional ability  Visit Diagnosis: Muscle weakness (generalized)  Other lack of coordination  Unsteadiness on feet  Repeated falls     Problem List Patient Active Problem List   Diagnosis Date Noted   Vertigo 02/19/2020   Urine abnormality 02/19/2020   E. coli bacteremia 02/23/2019   Dysphagia 02/23/2019   Aspiration pneumonia ([place]) 02/21/2019   Closed right hip fracture ([place]) 02/16/2019   Rib pain on right side  09/25/2018   Skin sore 12/01/2017   Hematoma 10/18/2017   Fall at home 10/18/2017   REM behavioral disorder 11/02/2016   Radicular pain in right arm 10/09/2016   GERD (gastroesophageal reflux disease) 07/31/2016   Trochanteric bursitis 03/03/2016   Left hand pain 10/30/2015   Fracture, finger, multiple sites 10/30/2015   Colon cancer screening 07/23/2015   Healthcare maintenance 07/23/2015   Gout 02/18/2015   Depression 01/06/2015   Irritation of eyelid 08/20/2014   Dupuytren's contracture 08/20/2014   Cough 07/21/2014   Lumbar stenosis with neurogenic claudication 06/13/2014   Preop cardiovascular exam 05/28/2014   S/P deep brain stimulator placement 05/08/2014   Joint pain 01/22/2014   Medicare annual wellness visit, initial 01/19/2014   Advance care planning 01/19/2014   Parkinson's disease ([place]) 12/13/2013   PTSD (post-traumatic stress disorder) 06/13/2013   Erectile dysfunction 06/13/2013   HLD (hyperlipidemia) 06/13/2013   Hip pain 06/10/2013   Pain in joint, shoulder region 06/10/2013   Right leg swelling 06/03/2013   Essential hypertension 06/03/2013   Bradycardia by electrocardiogram 06/03/2013   Obstructive sleep apnea 03/12/2013   Spinal stenosis, lumbar region, with neurogenic claudication 12/14/2012   Janna Arch, PT, DPT   03/04/2020, 1:11 PM  [place] MAIN [facility] SERVICES [address] [place], [place], [address] Phone: [phone]   Fax:  [phone]  Name: PRATEEK KNIPPLE MRN: [id number] Date of Birth: 02-18-1951

## 2020-03-04 NOTE — Therapy (Addendum)
[place] [facility] MAIN [facility] SERVICES [address] [place], [place], [address] Phone: [phone]   Fax:  [phone]  Occupational Therapy Treatment  Patient Details  Name: [name] MRN: [id number] Date of Birth: Jul 05, 1950 No data recorded  Encounter Date: 03/04/2020   OT End of Session - 03/04/20 1021    Visit Number 17    Number of Visits 24    Date for OT Re-Evaluation 03/06/20    OT Start Time 1015    OT Stop Time 1100    OT Time Calculation (min) 45 min    Activity Tolerance Patient tolerated treatment well    Behavior During Therapy [facility] for tasks assessed/performed           Past Medical History:  Diagnosis Date  . Arthritis   . Bradycardia   . Cancer [facility]) 2013   skin cancer  . Depression    ptsd  . Dysrhythmia    chronic slow heart rate  . GERD (gastroesophageal reflux disease)   . Headache(784.0)    tension headaches non recent  . History of chicken pox   . History of kidney stones    passed  . Hypertension    treated with HCTZ  . Parkinson's disease ([facility])    dx'ed 15 years ago  . PTSD (post-traumatic stress disorder)   . Shortness of breath dyspnea   . Sleep apnea    doesn't use C-pap  . Varicose veins     Past Surgical History:  Procedure Laterality Date  . CHOLECYSTECTOMY N/A 10/22/2014   Procedure: LAPAROSCOPIC CHOLECYSTECTOMY WITH INTRAOPERATIVE CHOLANGIOGRAM;  Surgeon: Tiney Rouge III, MD;  Location: [facility] ORS;  Service: General;  Laterality: N/A;  . cyst removed      from lip as a child  . INTRAMEDULLARY (IM) NAIL INTERTROCHANTERIC N/A 02/18/2019   Procedure: INTRAMEDULLARY (IM) NAIL INTERTROCHANTRIC, RIGHT,;  Surgeon: Juanell Fairly, MD;  Location: [facility] ORS;  Service: Orthopedics;  Laterality: N/A;  . LUMBAR LAMINECTOMY/DECOMPRESSION MICRODISCECTOMY Bilateral 12/14/2012   Procedure: Bilateral lumbar three-four, four-five decompressive laminotomy/foraminotomy;  Surgeon: Temple Pacini, MD;  [facility]UR[facility] OR[facility];  Service: Neurosurgery;  Laterality: Bilateral;  . PULSE GENERATOR IMPLANT Bilateral 12/13/2013   Procedure: Bilateral implantable pulse generator placement;  Surgeon: Maeola Harman, MD;  Location: [facility] OR[facility];  Service: Neurosurgery;  Laterality: Bilateral;  Bilateral implantable pulse generator placement  . skin cancer removed     from ears,   12 lft arm  rt leg 15  . SUBTHALAMIC STIMULATOR BATTERY REPLACEMENT Bilateral 07/14/2017   Procedure: BILATERAL IMPLANTED PULSE GENERATOR CHANGE FOR DEEP BRAIN STIMULATOR;  Surgeon: Maeola Harman, MD;  Location: [facility] OR;  Service: Neurosurgery;  Laterality: Bilateral;  . SUBTHALAMIC STIMULATOR INSERTION Bilateral 12/06/2013   Procedure: SUBTHALAMIC STIMULATOR INSERTION;  Surgeon: Maeola Harman, MD;  Location: [facility];  Service: Neurosurgery;  Laterality: Bilateral;  Bilateral deep brain stimulator placement    There were no vitals filed for this visit.   Subjective Assessment - 03/04/20 1021    Subjective  Pt. reports no more falls, and that he wears tennis shoes when standing now.    Patient is accompanied by: Family member    Pertinent History Pt. is a 69 yo male who has a history of Parkinson's Disease. Pt. has a Deep Brain Stimulator in place. Pt. has a history of Low Back Pain with 2 back surgeries in August 2014, and February 2016. Pt. has a history of multiple frequent falls, unsure of how  many falls in the last 6 months.    Patient Stated Goals Pt would like to be more independent daily.    Currently in Pain? No/denies          Self-care:  Pt. worked on Diplomatic Services operational officer form, and legibility. Pt. worked on prewriting exercises. Pt. formulated the words in small written text, with the written text progressively getting smaller as the writing progressed. With verbal cues pt. was able to create larger written text for lists of words. Pt. worked on Diplomatic Services operational officer sentences with positive deviation above the line. Pt. was able maintain a mature grasp on  the pen 100% of the time.  Pt. continues to make progress overall. Pt. initially performs written text in progressively smaller form, however was able to form larger written words with cues. Writing legibly was 50% initially, 75% after cues were provided when formulating lists of words. Pt. Formulated sentences in cursive with 50% legibility and positive deviation above the line. Pt. continues to work on improving overall BUE strength, and [facility] skills in order to improve ADLs, and IADL tasks including: writing, and typing.                     OT Education - 03/04/20 1021    Education provided Yes    Person(s) Educated Patient;Spouse    Methods Explanation    Comprehension Verbalized understanding               OT Long Term Goals - 02/10/20 1203      OT LONG TERM GOAL #1   Title Pt. will improve Bilateral UE strength by 1 mm grade to assist with ADLs, and IADLs    Baseline BUE strength 4/5 overall at eval    Time 12    Period Weeks    Status On-going      OT LONG TERM GOAL #2   Title Pt. will improve grip strength by 10# to open jars and containers.    Baseline difficulty at eval    Time 12    Period Weeks    Status On-going      OT LONG TERM GOAL #3   Title Patient will complete lower body dressing with modified independence.    Baseline min to mod assist at eval; sock aid supervision, MOD A doff R sock, IND doff L    Time 12    Period Weeks    Status Partially Met      OT LONG TERM GOAL #4   Title Patient to demonstrate the ability to vacuum flooring at home with use of modified techniques with supervision.    Baseline difficulty at eval    Time 12    Period Weeks    Status On-going      OT LONG TERM GOAL #5   Title Patient will demonstrate the ability to retrieve a snack and transport it to the living room with modified independence.    Baseline unable at eval    Time 12    Period Weeks    Status On-going      OT LONG TERM GOAL #6   Title  Patient will improve hand function to be able to perform cutting of food with modified independence.    Baseline wife helps to cut food when needed; VCs cutting theraputty    Time 8    Period Weeks    Status Partially Met      OT LONG TERM GOAL #7   Title Pt. will  write a paragraph efficiently with 90% legibility.    Baseline decreased legibility and poor hand endurance for writing more than 1 sentence.    Time 8    Period Weeks    Status On-going      OT LONG TERM GOAL  #10   TITLE Pt will don and doff pants with supervision    Baseline min assist to manage pants    Time 12    Period Weeks    Status On-going                 Plan - 03/04/20 1022    Clinical Impression Statement Pt. continues to make progress overall. Pt. initially performs written text in progressively smaller form, however was able to form larger written words with cues. Writing legibly was 50% initially, 75% after cues were provided when formulating lists of words. Pt. Formulated sentences in cursive with 50% legibility and positive deviation above the line. Pt. continues to work on improving overall BUE strength, and [facility] skills in order to improve ADLs, and IADL tasks including: writing, and typing.    OT Occupational Profile and History Detailed Assessment- Review of Records and additional review of physical, cognitive, psychosocial history related to current functional performance    Occupational performance deficits (Please refer to evaluation for details): ADL's;IADL's;Leisure    Body Structure / Function / Physical Skills ADL;Coordination;Endurance;GMC;UE functional use;Balance;Decreased knowledge of use of DME;IADL;Pain;Dexterity;FMC;Strength;Mobility;ROM    Cognitive Skills Attention;Problem Solve;Memory    Psychosocial Skills Coping Strategies;Environmental  Adaptations;Habits;Routines and Behaviors    Rehab Potential Good    Clinical Decision Making Limited treatment options, no task modification  necessary    Comorbidities Affecting Occupational Performance: Presence of comorbidities impacting occupational performance    Comorbidities impacting occupational performance description: risk for falls, slow to respond and process information at times, safety    Modification or Assistance to Complete Evaluation  No modification of tasks or assist necessary to complete eval    OT Frequency 2x / week    OT Duration 12 weeks    OT Treatment/Interventions Self-care/ADL training;Therapeutic exercise;DME and/or AE instruction;Energy conservation;Neuromuscular education;Patient/family education;Therapeutic activities;Moist Heat;Functional Mobility Training;Cognitive remediation/compensation    Consulted and Agree with Plan of Care Patient           Patient will benefit from skilled therapeutic intervention in order to improve the following deficits and impairments:   Body Structure / Function / Physical Skills: ADL, Coordination, Endurance, GMC, UE functional use, Balance, Decreased knowledge of use of DME, IADL, Pain, Dexterity, FMC, Strength, Mobility, ROM Cognitive Skills: Attention, Problem Solve, Memory Psychosocial Skills: Coping Strategies, Environmental  Adaptations, Habits, Routines and Behaviors   Visit Diagnosis: Muscle weakness (generalized)  Other lack of coordination    Problem List Patient Active Problem List   Diagnosis Date Noted  . Vertigo 02/19/2020  . Urine abnormality 02/19/2020  . E. coli bacteremia 02/23/2019  . Dysphagia 02/23/2019  . Aspiration pneumonia ([place]) 02/21/2019  . Closed right hip fracture ([place]) 02/16/2019  . Rib pain on right side 09/25/2018  . Skin sore 12/01/2017  . Hematoma 10/18/2017  . Fall at home 10/18/2017  . 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 Radicular pain in right arm 10/09/2016  . GERD (gastroesophageal reflux disease) 07/31/2016  . Trochanteric bursitis 03/03/2016  . Left hand pain 10/30/2015  . Fracture, finger, multiple  sites 10/30/2015  . Colon cancer screening 07/23/2015  . Healthcare maintenance 07/23/2015  . Gout 02/18/2015  . Depression 01/06/2015  . Irritation of eyelid  08/20/2014  . Dupuytren's contracture 08/20/2014  . Cough 07/21/2014  . Lumbar stenosis with neurogenic claudication 06/13/2014  . Preop cardiovascular exam 05/28/2014  . S/P deep brain stimulator placement 05/08/2014  . Joint pain 01/22/2014  . Medicare annual wellness visit, initial 01/19/2014  . Advance care planning 01/19/2014  . Parkinson's disease ([place]) 12/13/2013  . PTSD (post-traumatic stress disorder) 06/13/2013  . Erectile dysfunction 06/13/2013  . HLD (hyperlipidemia) 06/13/2013  . Hip pain 06/10/2013  . Pain in joint, shoulder region 06/10/2013  . Right leg swelling 06/03/2013  . Essential hypertension 06/03/2013  . Bradycardia by electrocardiogram 06/03/2013  . Obstructive sleep apnea 03/12/2013  . Spinal stenosis, lumbar region, with neurogenic claudication 12/14/2012    Harrel Carina, MS, OTR/L 03/04/2020, 10:48 AM  [place] MAIN [facility] SERVICES [place], [place], [address] Phone: [phone]   Fax:  [phone]  Name: [name] MRN: [id number] Date of Birth: 1950-11-11

## 2020-03-09 ENCOUNTER — Ambulatory Visit: Payer: Medicare PPO

## 2020-03-09 ENCOUNTER — Other Ambulatory Visit: Payer: Self-pay

## 2020-03-09 ENCOUNTER — Ambulatory Visit: Payer: Medicare PPO | Admitting: Occupational Therapy

## 2020-03-09 ENCOUNTER — Encounter: Payer: Self-pay | Admitting: Occupational Therapy

## 2020-03-09 DIAGNOSIS — M25551 Pain in right hip: Secondary | ICD-10-CM | POA: Diagnosis not present

## 2020-03-09 DIAGNOSIS — R2689 Other abnormalities of gait and mobility: Secondary | ICD-10-CM | POA: Diagnosis not present

## 2020-03-09 DIAGNOSIS — M6281 Muscle weakness (generalized): Secondary | ICD-10-CM | POA: Diagnosis not present

## 2020-03-09 DIAGNOSIS — R296 Repeated falls: Secondary | ICD-10-CM | POA: Diagnosis not present

## 2020-03-09 DIAGNOSIS — R278 Other lack of coordination: Secondary | ICD-10-CM

## 2020-03-09 DIAGNOSIS — R2681 Unsteadiness on feet: Secondary | ICD-10-CM | POA: Diagnosis not present

## 2020-03-09 DIAGNOSIS — R262 Difficulty in walking, not elsewhere classified: Secondary | ICD-10-CM | POA: Diagnosis not present

## 2020-03-09 NOTE — Therapy (Addendum)
[place] MAIN [facility] SERVICES [address] [place], [place], [address] Phone: [phone]   Fax:  [phone]  Occupational Therapy Treatment/Recertification Note  Patient Details  Name: [name] MRN: [id number] Date of Birth: 1950/05/27 No data recorded  Encounter Date: 03/09/2020   OT End of Session - 03/09/20 1803    Visit Number 18    Number of Visits 24    Date for OT Re-Evaluation 06/01/20    OT Start Time 1020    OT Stop Time 1100    OT Time Calculation (min) 40 min    Activity Tolerance Patient tolerated treatment well    Behavior During Therapy [facility] for tasks assessed/performed           Past Medical History:  Diagnosis Date  . Arthritis   . Bradycardia   . Cancer [facility]) 2013   skin cancer  . Depression    ptsd  . Dysrhythmia    chronic slow heart rate  . GERD (gastroesophageal reflux disease)   . Headache(784.0)    tension headaches non recent  . History of chicken pox   . History of kidney stones    passed  . Hypertension    treated with HCTZ  . Parkinson's disease ([place])    dx'ed 15 years ago  . PTSD (post-traumatic stress disorder)   . Shortness of breath dyspnea   . Sleep apnea    doesn't use C-pap  . Varicose veins     Past Surgical History:  Procedure Laterality Date  . CHOLECYSTECTOMY N/A 10/22/2014   Procedure: LAPAROSCOPIC CHOLECYSTECTOMY WITH INTRAOPERATIVE CHOLANGIOGRAM;  Surgeon: Dia Crawford III, MD;  Location: [facility] ORS;  Service: General;  Laterality: N/A;  . cyst removed      from lip as a child  . INTRAMEDULLARY (IM) NAIL INTERTROCHANTERIC N/A 02/18/2019   Procedure: INTRAMEDULLARY (IM) NAIL INTERTROCHANTRIC, RIGHT,;  Surgeon: Thornton Park, MD;  Location: [facility] ORS;  Service: Orthopedics;  Laterality: N/A;  . LUMBAR LAMINECTOMY/DECOMPRESSION MICRODISCECTOMY Bilateral 12/14/2012   Procedure: Bilateral lumbar three-four, four-five decompressive laminotomy/foraminotomy;  Surgeon: Charlie Pitter,  MD;  Location: [place] [facility];  Service: Neurosurgery;  Laterality: Bilateral;  . PULSE GENERATOR IMPLANT Bilateral 12/13/2013   Procedure: Bilateral implantable pulse generator placement;  Surgeon: Erline Levine, MD;  Location: [place] [facility];  Service: Neurosurgery;  Laterality: Bilateral;  Bilateral implantable pulse generator placement  . skin cancer removed     from ears,   12 lft arm  rt leg 15  . SUBTHALAMIC STIMULATOR BATTERY REPLACEMENT Bilateral 07/14/2017   Procedure: BILATERAL IMPLANTED PULSE GENERATOR CHANGE FOR DEEP BRAIN STIMULATOR;  Surgeon: Erline Levine, MD;  Location: [facility];  Service: Neurosurgery;  Laterality: Bilateral;  . SUBTHALAMIC STIMULATOR INSERTION Bilateral 12/06/2013   Procedure: SUBTHALAMIC STIMULATOR INSERTION;  Surgeon: Erline Levine, MD;  Location: [place] [facility];  Service: Neurosurgery;  Laterality: Bilateral;  Bilateral deep brain stimulator placement    There were no vitals filed for this visit.   Subjective Assessment - 03/09/20 1803    Subjective  Pt. reports no more falls, and that he wears tennis shoes when standing now.    Patient is accompanied by: Family member    Pertinent History Pt. is a 69 yo male who has a history of Parkinson's Disease. Pt. has a Deep Brain Stimulator in place. Pt. has a history of Low Back Pain with 2 back surgeries in August 2014, and February 2016. Pt. has a history of multiple frequent falls, unsure of  how many falls in the last 6 months.    Patient Stated Goals Pt would like to be more independent daily.    Currently in Pain? Yes    Pain Score 5     Pain Location Leg    Pain Orientation Right    Pain Descriptors / Indicators Aching          Self-care:  Pt. worked on writing form, and legibility creating word lists, a sentence, and completing simulated checks. Pt. formulated the words in small written text, with the written text progressively getting smaller as the writing progressed. With verbal cues pt. was able to create  larger written text for lists of words. Pt. Presented with slight deviation above the line. Pt. was able maintain a mature grasp on the pen 100% of the time.  Pt. is planning to have a procedure for his Deep Brain Stimulator tomorrow. Pt. initially performed written text in progressively smaller form, however was able to form larger written words with cues. Writing legibly was 75%. Pt. Formulated sentences in cursive with 75% legibility and positive deviation above the line. Pt. continues to make steady progress overall with ADL, and IADL functioning. Pt. Has progressed with standing to hike, and negotiate clothing writing legibility, LE dressing, and buttoning. Pt. Has had multiple falls, and requires continued work to improve self dressing, toileting safety, writing, and typing skills.                       OT Education - 03/09/20 1803    Education provided Yes    Education Details A/E use, left hand strength    Person(s) Educated Patient;Spouse    Methods Explanation    Comprehension Verbalized understanding               OT Long Term Goals - 03/09/20 1805      OT LONG TERM GOAL #1   Title Pt. will improve Bilateral UE strength by 1 mm grade to assist with ADLs, and IADLs    Baseline BUE strength 4/5 overall at eval    Time 12    Period Weeks    Status On-going    Target Date 06/01/20      OT LONG TERM GOAL #2   Title Pt. will improve grip strength by 10# to open jars and containers.    Baseline Pt. conitnues to have difficulty opening jars    Time 12    Period Weeks    Status On-going    Target Date 06/01/20      OT LONG TERM GOAL #3   Title Patient will complete lower body dressing with modified independence.    Baseline min to mod assist at eval; sock aid supervision, Mod A doff R sock, IND doff L    Time 12    Period Weeks    Status Partially Met    Target Date 06/01/20      OT LONG TERM GOAL #4   Title Patient to demonstrate the ability to  vacuum flooring at home with use of modified techniques with supervision.    Baseline Pt. continues to hav difficulty    Time 12    Period Weeks    Status On-going    Target Date 06/01/20      OT LONG TERM GOAL #5   Title Patient will demonstrate the ability to retrieve a snack and transport it to the living room with modified independence.    Baseline Pt. conitnues  to work on transporting items    Time 12    Period Weeks    Status On-going    Target Date 06/11/20      OT LONG TERM GOAL #6   Title Patient will improve hand function to be able to perform cutting of food with modified independence.    Baseline Pt. is improving, however continues to have difficulty    Time 12    Period Weeks    Status Partially Met    Target Date 06/01/20      OT LONG TERM GOAL #7   Title Pt. will write a paragraph efficiently with 90% legibility.    Baseline Limited writing legibility and poor hand endurance for writing more than 1 sentence.    Time 12    Period Weeks    Status On-going    Target Date 06/01/20      OT LONG TERM GOAL #8   Title Pt. will improve typing speed, and accuracy to be able to to type emails efficently    Baseline Continues to be limited    Time 12    Period Weeks    Status On-going    Target Date 06/01/20      OT LONG TERM GOAL  #9   Baseline Pt will transfers in and out of the shower with stand-by assistance    Time 12    Period Weeks    Status On-going    Target Date 06/01/20      OT LONG TERM GOAL  #10   TITLE Pt will don and doff pants with supervision    Baseline min assist to manage pants    Time 12    Period Weeks    Target Date 06/01/20                 Plan - 03/09/20 1805    Clinical Impression Statement Pt. is planning to have a procedure for his [employer], political party tomorrow. Pt. initially performed written text in progressively smaller form, however was able to form larger written words with cues. Writing legibly was 75%. Pt.  Formulated sentences in cursive with 75% legibility and positive deviation above the line. Pt. continues to make steady progress overall with ADL, and IADL functioning. Pt. Has progressed with standing to hike, and negotiate clothing writing legibility, LE dressing, and buttoning. Pt. Has had multiple falls, and requires continued work to improve self dressing, toileting safety, writing, and typing skills.   OT Occupational Profile and History Detailed Assessment- Review of Records and additional review of physical, cognitive, psychosocial history related to current functional performance    Occupational performance deficits (Please refer to evaluation for details): ADL's;IADL's;Leisure    Body Structure / Function / Physical Skills ADL;Coordination;Endurance;GMC;UE functional use;Balance;Decreased knowledge of use of DME;IADL;Pain;Dexterity;FMC;Strength;Mobility;ROM    Cognitive Skills Attention;Problem Solve;Memory    Psychosocial Skills Coping Strategies;Environmental  Adaptations;Habits;Routines and Behaviors    Rehab Potential Good    Clinical Decision Making Limited treatment options, no task modification necessary    Comorbidities Affecting Occupational Performance: Presence of comorbidities impacting occupational performance    Comorbidities impacting occupational performance description: risk for falls, slow to respond and process information at times, safety    Modification or Assistance to Complete Evaluation  No modification of tasks or assist necessary to complete eval    OT Frequency 2x / week    OT Duration 12 weeks    OT Treatment/Interventions Self-care/ADL training;Therapeutic exercise;DME and/or AE instruction;Energy conservation;Neuromuscular education;Patient/family education;Therapeutic activities;Moist  Heat;Functional Mobility Training;Cognitive remediation/compensation    Consulted and Agree with Plan of Care Patient           Patient will benefit from skilled therapeutic  intervention in order to improve the following deficits and impairments:   Body Structure / Function / Physical Skills: ADL, Coordination, Endurance, GMC, UE functional use, Balance, Decreased knowledge of use of DME, IADL, Pain, Dexterity, FMC, Strength, Mobility, ROM Cognitive Skills: Attention, Problem Solve, Memory Psychosocial Skills: Coping Strategies, Environmental  Adaptations, Habits, Routines and Behaviors   Visit Diagnosis: Muscle weakness (generalized)  Other lack of coordination    Problem List Patient Active Problem List   Diagnosis Date Noted  . Vertigo 02/19/2020  . Urine abnormality 02/19/2020  . E. coli bacteremia 02/23/2019  . Dysphagia 02/23/2019  . Aspiration pneumonia ([place]) 02/21/2019  . Closed right hip fracture ([place]) 02/16/2019  . Rib pain on right side 09/25/2018  . Skin sore 12/01/2017  . Hematoma 10/18/2017  . Fall at home 10/18/2017  . 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 Radicular pain in right arm 10/09/2016  . GERD (gastroesophageal reflux disease) 07/31/2016  . Trochanteric bursitis 03/03/2016  . Left hand pain 10/30/2015  . Fracture, finger, multiple sites 10/30/2015  . Colon cancer screening 07/23/2015  . Healthcare maintenance 07/23/2015  . Gout 02/18/2015  . Depression 01/06/2015  . Irritation of eyelid 08/20/2014  . Dupuytren's contracture 08/20/2014  . Cough 07/21/2014  . Lumbar stenosis with neurogenic claudication 06/13/2014  . Preop cardiovascular exam 05/28/2014  . S/P deep brain stimulator placement 05/08/2014  . Joint pain 01/22/2014  . Medicare annual wellness visit, initial 01/19/2014  . Advance care planning 01/19/2014  . Parkinson's disease ([place]) 12/13/2013  . PTSD (post-traumatic stress disorder) 06/13/2013  . Erectile dysfunction 06/13/2013  . HLD (hyperlipidemia) 06/13/2013  . Hip pain 06/10/2013  . Pain in joint, shoulder region 06/10/2013  . Right leg swelling 06/03/2013  . Essential hypertension 06/03/2013  .  Bradycardia by electrocardiogram 06/03/2013  . Obstructive sleep apnea 03/12/2013  . Spinal stenosis, lumbar region, with neurogenic claudication 12/14/2012    Harrel Carina, MS, OTR/L 03/09/2020, 6:14 PM  [place] MAIN [facility] SERVICES [address] [place], [place], [address] Phone: [phone]   Fax:  [phone]  Name: [name] MRN: [id number] Date of Birth: 08-Dec-1950

## 2020-03-09 NOTE — Therapy (Signed)
[place] MAIN [facility] SERVICES [address] [place], [place], [address] Phone: [phone]   Fax:  [phone]  Physical Therapy Treatment  Patient Details  Name: [name] MRN: [id number] Date of Birth: 1950-04-22 Referring Provider (PT): Sharolyn Douglas MD    Encounter Date: 03/09/2020   PT End of Session - 03/09/20 0936    Visit Number 34    Number of Visits 52    Date for PT Re-Evaluation 04/01/20    Authorization Type 4/10 PN 11/8    PT Start Time 0930    PT Stop Time 1013    PT Time Calculation (min) 43 min    Equipment Utilized During Treatment Gait belt    Activity Tolerance Patient tolerated treatment well    Behavior During Therapy [facility] for tasks assessed/performed           Past Medical History:  Diagnosis Date   Arthritis    Bradycardia    Cancer ([place]) 2013   skin cancer   Depression    ptsd   Dysrhythmia    chronic slow heart rate   GERD (gastroesophageal reflux disease)    Headache(784.0)    tension headaches non recent   History of chicken pox    History of kidney stones    passed   Hypertension    treated with HCTZ   Parkinson's disease ([place])    dx'ed 15 years ago   PTSD (post-traumatic stress disorder)    Shortness of breath dyspnea    Sleep apnea    doesn't use C-pap   Varicose veins     Past Surgical History:  Procedure Laterality Date   CHOLECYSTECTOMY N/A 10/22/2014   Procedure: LAPAROSCOPIC CHOLECYSTECTOMY WITH INTRAOPERATIVE CHOLANGIOGRAM;  Surgeon: Dia Crawford III, MD;  Location: [facility] ORS;  Service: General;  Laterality: N/A;   cyst removed      from lip as a child   INTRAMEDULLARY (IM) NAIL INTERTROCHANTERIC N/A 02/18/2019   Procedure: INTRAMEDULLARY (IM) NAIL INTERTROCHANTRIC, RIGHT,;  Surgeon: Thornton Park, MD;  Location: [facility] ORS;  Service: Orthopedics;  Laterality: N/A;   LUMBAR LAMINECTOMY/DECOMPRESSION MICRODISCECTOMY Bilateral 12/14/2012   Procedure: Bilateral  lumbar three-four, four-five decompressive laminotomy/foraminotomy;  Surgeon: Charlie Pitter, MD;  Location: [place] NEURO ORS;  Service: Neurosurgery;  Laterality: Bilateral;   PULSE GENERATOR IMPLANT Bilateral 12/13/2013   Procedure: Bilateral implantable pulse generator placement;  Surgeon: Erline Levine, MD;  Location: [place] NEURO ORS;  Service: Neurosurgery;  Laterality: Bilateral;  Bilateral implantable pulse generator placement   skin cancer removed     from ears,   12 lft arm  rt leg 15   SUBTHALAMIC STIMULATOR BATTERY REPLACEMENT Bilateral 07/14/2017   Procedure: BILATERAL IMPLANTED PULSE GENERATOR CHANGE FOR DEEP BRAIN STIMULATOR;  Surgeon: Erline Levine, MD;  Location: [place];  Service: Neurosurgery;  Laterality: Bilateral;   SUBTHALAMIC STIMULATOR INSERTION Bilateral 12/06/2013   Procedure: SUBTHALAMIC STIMULATOR INSERTION;  Surgeon: Erline Levine, MD;  Location: [place] NEURO ORS;  Service: Neurosurgery;  Laterality: Bilateral;  Bilateral deep brain stimulator placement    There were no vitals filed for this visit.   Subjective Assessment - 03/09/20 0935    Subjective Patient reports 5/10 pain in R hip, was worse yesterday. Patient getting his surgery tomorrow for his deep brain stimulator.    Pertinent History [name] is a 69 y.o. male with a hx of HTN, bradycardia, Parkinson's s/p deep brain stimulator 11/2013 , HLD, depression, possible seizure disorder, arthritis, depression/PTSD, dysrhythmia, GERD, sleep apnea.  Patient was seen by this therapist in years past. Patient had a fall around Halloween of 2020 and admitted for ORIF on 02/18/19, was discharged to SNF and had [place] around Christmas. Had home health therapy for a month and a half. After that tried to go to Outpatient Therapy for 3 sessions at [facility] and was not pleased due to how busy and crowded the location was. Fell last week in bathroom, didn't take walker in, falls about 2x month. Is very weak in RLE. Patient has a new lift  chair and U drive walking. Has a caregiver 4 days a week. Has additional order for radiculopathy, lumbar region. PMH of lumbar laminectomy/decompression microdiscectomy (L3-4, 4-5)  In 2014.  Lumbar 4-5 cage +rod (2016).    Limitations Walking;Standing;Sitting;House hold activities;Lifting    How long can you sit comfortably? not limited with a backrest, without a back rest 3 minutes    How long can you stand comfortably? 3-5 minutes with holding on.    How long can you walk comfortably? with U step 6 minutes.    Patient Stated Goals pain reduction of R hip. strength of legs, dance with wife. push lawnmower.    Currently in Pain? Yes    Pain Score 5     Pain Location Leg    Pain Orientation Right    Pain Descriptors / Indicators Aching    Pain Type Chronic pain    Pain Onset More than a month ago    Pain Frequency Intermittent           Patient reports 5/10 pain in R hip, was worse yesterday. Patient getting his surgery tomorrow for his deep brain stimulator.        Neuro Re-ed: Cues for sequencing, safety, with close CGA Speed ladder: one foot each square, cues for widening BOS x 10 lengths of // bars   TherEx: cues for sequencing, body mechanics   RTB around knees: -marching 20x cues for RLE raising leg -abduction 20x, tactile cueing for arc of motion RLE.  12x bridges cues for core contraction and gluteal squeeze   Sidelie: RLE inverse clamshell 15x RLB hip extension with mod A x 15x  Stand: Ambulate 160 ft with rollator and cues for widening BOS and upright posture   Manual: supine: Hamstring stretch x 60 seconds each leg. PT educates caregiver on proper form and execution of task. R hip inferior distractionwith belt SAD (short arc distraction)for pain management. 30 second hold6x. R hiplateral distraction (SAD) with belt4x 30 seconds   Pt educated throughout session about proper posture and technique with exercises. Improved exercise technique, movement  at target joints, use of target muscles after min to mod verbal, visual, tactile cues.                      PT Education - 03/09/20 0936    Education provided Yes    Education Details exercise technique, body mechanics    Person(s) Educated Patient    Methods Explanation;Demonstration;Tactile cues;Verbal cues    Comprehension Verbalized understanding;Returned demonstration;Verbal cues required;Tactile cues required            PT Short Term Goals - 01/08/20 1024      PT SHORT TERM GOAL #1   Title Pt will perform HEP with family's/aide's supervision, for improved balance, transfers, and gait.    Baseline 5/5: HEP given 6/28: HEP compliant 8/23: aide helps with HEP    Time 4    Period Weeks  Status Achieved    Target Date 09/19/19             PT Long Term Goals - 02/26/20 0945      PT LONG TERM GOAL #1   Title Patient will increase Berg Balance score by > 6 points (54/56) to demonstrate decreased fall risk during functional activities.    Baseline 10/20: 48/56 11/10: 49/56    Time 8    Period Weeks    Status Partially Met    Target Date 04/01/20      PT LONG TERM GOAL #2   Title Patient will ambulate within his household for ~ 5 minutes without an AD with no episodes of falling to increase independence with household mobility    Baseline 10/20: requires use of rollator to ambulate 11/10: requires AD    Time 8    Period Weeks    Status On-going    Target Date 04/01/20      PT LONG TERM GOAL #4   Title Patient will increase FOTO score to equal to or greater than 31/100  to demonstrate statistically significant improvement in mobility and quality of life.    Baseline 5/5: 12/100 6/28: 38% 8/23: 11.7 9/22: 45% 10/20: 47.9% 11/10: 40.2%    Time 8    Period Weeks    Status Achieved      PT LONG TERM GOAL #5   Title Patient will increase six minute walk test distance to >1000 for progression to community ambulator and improve gait ability    Baseline  6/28: 750 ft with U step rollator 8/23: 775 w rolling walker, increased crouch pattern of ambulation 9/22: deferred due to pain; 10/18: 871f 11/10: 770 ft    Time 8    Period Weeks    Status On-going    Target Date 04/01/20      PT LONG TERM GOAL #6   Title Patient will increase Berg Balance score by > 6 points (42/56) to demonstrate decreased fall risk during functional activities.    Baseline 6/28: 36/56 8/23: 42/56 9/22 44/56 10/20: 48/56    Time 8    Period Weeks    Status Achieved      PT LONG TERM GOAL #7   Title Patient will report a worst pain of 3/10 on VAS in low back to improve tolerance with ADLs and reduced symptoms with activities.    Baseline 8/9: 9/10 8/23: 7/10 pain 9/22: 7/10 10/20: 5/10 11/10:  one episode of 9/10 when fell  the rest 4-6/10    Time 8    Period Weeks    Status Partially Met    Target Date 04/01/20      PT LONG TERM GOAL #8   Title Patient will reduce modified Oswestry score to <20 as to demonstrate minimal disability with ADLs including improved sleeping tolerance, walking/sitting tolerance etc for better mobility with ADLs.    Baseline 8/9: 64% 8/23: 66% 9/22:44% 10/20: 40% 11/20: 54%    Time 8    Period Weeks    Status Partially Met    Target Date 04/01/20                 Plan - 03/09/20 1255    Clinical Impression Statement Patient requires regression of interventions due to pain in R hip. Pain reduced by end of session of 3/10. Limited hip extension noted in RLE with tight hip flexors and limited posterior chain recruitment. Will see patient next week after surgery tomorrow. Patient  would benefit from skilled physical therapy to increase strength, stability, balance, and decrease pain to improve quality of life    Personal Factors and Comorbidities Age;Comorbidity 3+;Past/Current Experience;Time since onset of injury/illness/exacerbation;Transportation    Comorbidities athritis, bradycardia, cancer, depression, dysrhythmia, GERD, HTN,  Parkinson's, PTSD, SOB, sleep apnea    Examination-Activity Limitations Bathing;Bed Mobility;Caring for Others;Bend;Dressing;Lift;Locomotion Level;Reach Overhead;Sit;Squat;Stairs;Stand;Toileting;Transfers    Examination-Participation Restrictions Church;Cleaning;Community Activity;Interpersonal Relationship;Shop;Volunteer;Yard Work;Other    Stability/Clinical Decision Making Stable/Uncomplicated    Rehab Potential Fair    PT Frequency 2x / week    PT Duration 8 weeks    PT Treatment/Interventions ADLs/Self Care Home Management;Aquatic Therapy;Cryotherapy;Electrical Stimulation;Iontophoresis 45m/ml Dexamethasone;Moist Heat;Ultrasound;Contrast Bath;DME Instruction;Gait training;Stair training;Functional mobility training;Therapeutic activities;Therapeutic exercise;Balance training;Neuromuscular re-education;Patient/family education;Manual techniques;Wheelchair mobility training;Energy conservation;Passive range of motion;Traction;Orthotic Fit/Training;Dry needling;Vestibular;Visual/perceptual remediation/compensation;Taping;Canalith Repostioning    PT Next Visit Plan core and pain relief    PT Home Exercise Plan [place] Agree with Plan of Care Patient;Family member/caregiver    Family Member Consulted wife           Patient will benefit from skilled therapeutic intervention in order to improve the following deficits and impairments:  Abnormal gait, Decreased balance, Decreased coordination, Decreased mobility, Impaired tone, Postural dysfunction, Decreased strength, Decreased safety awareness, Improper body mechanics, Impaired flexibility, Decreased activity tolerance, Decreased endurance, Decreased knowledge of precautions, Difficulty walking, Pain, Cardiopulmonary status limiting activity, Decreased range of motion, Impaired perceived functional ability  Visit Diagnosis: Muscle weakness (generalized)  Other lack of coordination  Unsteadiness on feet     Problem  List Patient Active Problem List   Diagnosis Date Noted   Vertigo 02/19/2020   Urine abnormality 02/19/2020   E. coli bacteremia 02/23/2019   Dysphagia 02/23/2019   Aspiration pneumonia ([place] 02/21/2019   Closed right hip fracture ([place] 02/16/2019   Rib pain on right side 09/25/2018   Skin sore 12/01/2017   Hematoma 10/18/2017   Fall at home 10/18/2017   REM behavioral disorder 11/02/2016   Radicular pain in right arm 10/09/2016   GERD (gastroesophageal reflux disease) 07/31/2016   Trochanteric bursitis 03/03/2016   Left hand pain 10/30/2015   Fracture, finger, multiple sites 10/30/2015   Colon cancer screening 07/23/2015   Healthcare maintenance 07/23/2015   Gout 02/18/2015   Depression 01/06/2015   Irritation of eyelid 08/20/2014   Dupuytren's contracture 08/20/2014   Cough 07/21/2014   Lumbar stenosis with neurogenic claudication 06/13/2014   Preop cardiovascular exam 05/28/2014   S/P deep brain stimulator placement 05/08/2014   Joint pain 01/22/2014   Medicare annual wellness visit, initial 01/19/2014   Advance care planning 01/19/2014   [place]inson's disease ([place] 12/13/2013   PTSD (post-traumatic stress disorder) 06/13/2013   Erectile dysfunction 06/13/2013   HLD (hyperlipidemia) 06/13/2013   Hip pain 06/10/2013   Pain in joint, shoulder region 06/10/2013   Right leg swelling 06/03/2013   Essential hypertension 06/03/2013   Bradycardia by electrocardiogram 06/03/2013   Obstructive sleep apnea 03/12/2013   Spinal stenosis, lumbar region, with neurogenic claudication 12/14/2012   MJanna Arch PT, DPT   03/09/2020, 12:58 PM  [place] [facility] [address] [address]Phone: [phone]  Fax:  [phone] Name: TGODWIN TEDESCOMRN: [id number] of Birth: [birth year]

## 2020-03-10 DIAGNOSIS — I10 Essential (primary) hypertension: Secondary | ICD-10-CM | POA: Diagnosis not present

## 2020-03-10 DIAGNOSIS — G20 Parkinson's disease: Secondary | ICD-10-CM | POA: Diagnosis not present

## 2020-03-10 DIAGNOSIS — Z885 Allergy status to narcotic agent status: Secondary | ICD-10-CM | POA: Diagnosis not present

## 2020-03-10 DIAGNOSIS — Z4542 Encounter for adjustment and management of neuropacemaker (brain) (peripheral nerve) (spinal cord): Secondary | ICD-10-CM | POA: Diagnosis not present

## 2020-03-10 DIAGNOSIS — E785 Hyperlipidemia, unspecified: Secondary | ICD-10-CM | POA: Diagnosis not present

## 2020-03-10 DIAGNOSIS — F32A Depression, unspecified: Secondary | ICD-10-CM | POA: Diagnosis not present

## 2020-03-10 DIAGNOSIS — G4733 Obstructive sleep apnea (adult) (pediatric): Secondary | ICD-10-CM | POA: Diagnosis not present

## 2020-03-10 DIAGNOSIS — K219 Gastro-esophageal reflux disease without esophagitis: Secondary | ICD-10-CM | POA: Diagnosis not present

## 2020-03-11 ENCOUNTER — Ambulatory Visit: Payer: Medicare PPO

## 2020-03-11 ENCOUNTER — Ambulatory Visit: Payer: Medicare PPO | Admitting: Occupational Therapy

## 2020-03-16 ENCOUNTER — Ambulatory Visit: Payer: Medicare PPO

## 2020-03-16 ENCOUNTER — Encounter: Payer: Self-pay | Admitting: Occupational Therapy

## 2020-03-16 ENCOUNTER — Other Ambulatory Visit: Payer: Self-pay

## 2020-03-16 ENCOUNTER — Ambulatory Visit (INDEPENDENT_AMBULATORY_CARE_PROVIDER_SITE_OTHER): Payer: Medicare PPO | Admitting: Psychology

## 2020-03-16 ENCOUNTER — Ambulatory Visit: Payer: Medicare PPO | Admitting: Occupational Therapy

## 2020-03-16 DIAGNOSIS — M6281 Muscle weakness (generalized): Secondary | ICD-10-CM

## 2020-03-16 DIAGNOSIS — R296 Repeated falls: Secondary | ICD-10-CM | POA: Diagnosis not present

## 2020-03-16 DIAGNOSIS — R2681 Unsteadiness on feet: Secondary | ICD-10-CM | POA: Diagnosis not present

## 2020-03-16 DIAGNOSIS — F331 Major depressive disorder, recurrent, moderate: Secondary | ICD-10-CM | POA: Diagnosis not present

## 2020-03-16 DIAGNOSIS — F4323 Adjustment disorder with mixed anxiety and depressed mood: Secondary | ICD-10-CM | POA: Diagnosis not present

## 2020-03-16 DIAGNOSIS — R278 Other lack of coordination: Secondary | ICD-10-CM | POA: Diagnosis not present

## 2020-03-16 DIAGNOSIS — M25551 Pain in right hip: Secondary | ICD-10-CM | POA: Diagnosis not present

## 2020-03-16 DIAGNOSIS — R262 Difficulty in walking, not elsewhere classified: Secondary | ICD-10-CM | POA: Diagnosis not present

## 2020-03-16 DIAGNOSIS — R2689 Other abnormalities of gait and mobility: Secondary | ICD-10-CM | POA: Diagnosis not present

## 2020-03-16 NOTE — Therapy (Signed)
[place] MAIN [facility] SERVICES [address] [place], [place], [address] Phone: [phone]   Fax:  [phone]  Occupational Therapy Treatment  Patient Details  Name: [name] MRN: [id number] Date of Birth: 08/12/1950 No data recorded  Encounter Date: 03/16/2020   OT End of Session - 03/16/20 1026    Visit Number 19    Number of Visits 24    Date for OT Re-Evaluation 06/01/20    Authorization Type reporting period starting 02/05/2020    OT Start Time 1015    OT Stop Time 1100    OT Time Calculation (min) 45 min    Activity Tolerance Patient tolerated treatment well    Behavior During Therapy [facility] for tasks assessed/performed           Past Medical History:  Diagnosis Date  . Arthritis   . Bradycardia   . Cancer [facility]) 2013   skin cancer  . Depression    ptsd  . Dysrhythmia    chronic slow heart rate  . GERD (gastroesophageal reflux disease)   . Headache(784.0)    tension headaches non recent  . History of chicken pox   . History of kidney stones    passed  . Hypertension    treated with HCTZ  . Parkinson's disease ([place])    dx'ed 15 years ago  . PTSD (post-traumatic stress disorder)   . Shortness of breath dyspnea   . Sleep apnea    doesn't use C-pap  . Varicose veins     Past Surgical History:  Procedure Laterality Date  . CHOLECYSTECTOMY N/A 10/22/2014   Procedure: LAPAROSCOPIC CHOLECYSTECTOMY WITH INTRAOPERATIVE CHOLANGIOGRAM;  Surgeon: Dia Crawford III, MD;  Location: [facility] ORS;  Service: General;  Laterality: N/A;  . cyst removed      from lip as a child  . INTRAMEDULLARY (IM) NAIL INTERTROCHANTERIC N/A 02/18/2019   Procedure: INTRAMEDULLARY (IM) NAIL INTERTROCHANTRIC, RIGHT,;  Surgeon: Thornton Park, MD;  Location: [facility] ORS;  Service: Orthopedics;  Laterality: N/A;  . LUMBAR LAMINECTOMY/DECOMPRESSION MICRODISCECTOMY Bilateral 12/14/2012   Procedure: Bilateral lumbar three-four, four-five decompressive  laminotomy/foraminotomy;  Surgeon: Charlie Pitter, MD;  Location: [place] NEURO ORS;  Service: Neurosurgery;  Laterality: Bilateral;  . PULSE GENERATOR IMPLANT Bilateral 12/13/2013   Procedure: Bilateral implantable pulse generator placement;  Surgeon: Erline Levine, MD;  Location: [place] NEURO ORS;  Service: Neurosurgery;  Laterality: Bilateral;  Bilateral implantable pulse generator placement  . skin cancer removed     from ears,   12 lft arm  rt leg 15  . SUBTHALAMIC STIMULATOR BATTERY REPLACEMENT Bilateral 07/14/2017   Procedure: BILATERAL IMPLANTED PULSE GENERATOR CHANGE FOR DEEP BRAIN STIMULATOR;  Surgeon: Erline Levine, MD;  Location: [place];  Service: Neurosurgery;  Laterality: Bilateral;  . SUBTHALAMIC STIMULATOR INSERTION Bilateral 12/06/2013   Procedure: SUBTHALAMIC STIMULATOR INSERTION;  Surgeon: Erline Levine, MD;  Location: [place] NEURO ORS;  Service: Neurosurgery;  Laterality: Bilateral;  Bilateral deep brain stimulator placement    There were no vitals filed for this visit.   Subjective Assessment - 03/16/20 1024    Patient is accompanied by: Family member    Pertinent History Pt. is a 69 yo male who has a history of Parkinson's Disease. Pt. has a Deep Brain Stimulator in place. Pt. has a history of Low Back Pain with 2 back surgeries in August 2014, and February 2016. Pt. has a history of multiple frequent falls, unsure of how many falls in the last 6 months.  Limitations Frequent falls, limited motor control, and [place].     Currently in Pain? No/denies           OT TREATMENT    Neuro muscular re-education:  Pt. worked on [facility] skills manipulating nuts, and bolts on a bolt board. Pt. worked on screwing, and unscrewing nuts, and bolts of varying sizes, and challenging progressively smaller items. Pt. Required increased to connect the nuts to the bolts with his vision occluded.   Pt. had a DBS procedure on 11/23.  Pt. reports that he is feeling better than last week initially following the  procedure. Pt. Is improving with using his left hand to connect the nuts to the various sized bolts with his vision occluded. The board was positioned at the tabletop, however required increased time to complete the task. Pt. continues to work on improving UE strength, and [facility] skills in order to be able to participate in and perform meaningful ADLs, and IADL tasks.                        OT Education - 03/16/20 1026    Education provided Yes    Education Details A/E use, left hand strength    Person(s) Educated Patient;Spouse    Methods Explanation    Comprehension Verbalized understanding               OT Long Term Goals - 03/09/20 1805      OT LONG TERM GOAL #1   Title Pt. will improve Bilateral UE strength by 1 mm grade to assist with ADLs, and IADLs    Baseline BUE strength 4/5 overall at eval    Time 12    Period Weeks    Status On-going    Target Date 06/01/20      OT LONG TERM GOAL #2   Title Pt. will improve grip strength by 10# to open jars and containers.    Baseline Pt. conitnues to have difficulty opening jars    Time 12    Period Weeks    Status On-going    Target Date 06/01/20      OT LONG TERM GOAL #3   Title Patient will complete lower body dressing with modified independence.    Baseline min to mod assist at eval; sock aid supervision, Mod A doff R sock, IND doff L    Time 12    Period Weeks    Status Partially Met    Target Date 06/01/20      OT LONG TERM GOAL #4   Title Patient to demonstrate the ability to vacuum flooring at home with use of modified techniques with supervision.    Baseline Pt. continues to hav difficulty    Time 12    Period Weeks    Status On-going    Target Date 06/01/20      OT LONG TERM GOAL #5   Title Patient will demonstrate the ability to retrieve a snack and transport it to the living room with modified independence.    Baseline Pt. conitnues to work on transporting items    Time 12    Period  Weeks    Status On-going    Target Date 06/11/20      OT LONG TERM GOAL #6   Title Patient will improve hand function to be able to perform cutting of food with modified independence.    Baseline Pt. is improving, however continues to have difficulty    Time  12    Period Weeks    Status Partially Met    Target Date 06/01/20      OT LONG TERM GOAL #7   Title Pt. will write a paragraph efficiently with 90% legibility.    Baseline Limited writing legibility and poor hand endurance for writing more than 1 sentence.    Time 12    Period Weeks    Status On-going    Target Date 06/01/20      OT LONG TERM GOAL #8   Title Pt. will improve typing speed, and accuracy to be able to to type emails efficently    Baseline Continues to be limited    Time 12    Period Weeks    Status On-going    Target Date 06/01/20      OT LONG TERM GOAL  #9   Baseline Pt will transfers in and out of the shower with stand-by assistance    Time 12    Period Weeks    Status On-going    Target Date 06/01/20      OT LONG TERM GOAL  #10   TITLE Pt will don and doff pants with supervision    Baseline min assist to manage pants    Time 12    Period Weeks    Target Date 06/01/20                 Plan - 03/16/20 1027    Clinical Impression Statement Pt. had a DBS procedure on 11/23.  Pt. reports that he is feeling better than last week initially following the procedure. Pt. Is improving with using his left hand to connect the nuts to the various sized bolts with his vision occluded. The board was positioned at the tabletop, however required increased time to complete the task. Pt. continues to work on improving UE strength, and [facility] skills in order to be able to participate in and perform meaningful ADLs, and IADL tasks.    OT Occupational Profile and History Detailed Assessment- Review of Records and additional review of physical, cognitive, psychosocial history related to current functional performance     Occupational performance deficits (Please refer to evaluation for details): ADL's;IADL's;Leisure    Body Structure / Function / Physical Skills ADL;Coordination;Endurance;GMC;UE functional use;Balance;Decreased knowledge of use of DME;IADL;Pain;Dexterity;FMC;Strength;Mobility;ROM    Cognitive Skills Attention;Problem Solve;Memory    Psychosocial Skills Coping Strategies;Environmental  Adaptations;Habits;Routines and Behaviors    Rehab Potential Good    Clinical Decision Making Limited treatment options, no task modification necessary    Comorbidities Affecting Occupational Performance: Presence of comorbidities impacting occupational performance    Comorbidities impacting occupational performance description: risk for falls, slow to respond and process information at times, safety    Modification or Assistance to Complete Evaluation  No modification of tasks or assist necessary to complete eval    OT Frequency 2x / week    OT Duration 12 weeks    OT Treatment/Interventions Self-care/ADL training;Therapeutic exercise;DME and/or AE instruction;Energy conservation;Neuromuscular education;Patient/family education;Therapeutic activities;Moist Heat;Functional Mobility Training;Cognitive remediation/compensation    Consulted and Agree with Plan of Care Patient           Patient will benefit from skilled therapeutic intervention in order to improve the following deficits and impairments:   Body Structure / Function / Physical Skills: ADL, Coordination, Endurance, GMC, UE functional use, Balance, Decreased knowledge of use of DME, IADL, Pain, Dexterity, FMC, Strength, Mobility, ROM Cognitive Skills: Attention, Problem Solve, Memory Psychosocial Skills: Coping Strategies, Environmental  Adaptations, Habits, Routines and Behaviors   Visit Diagnosis: Muscle weakness (generalized)  Other lack of coordination    Problem List Patient Active Problem List   Diagnosis Date Noted  . Vertigo  02/19/2020  . Urine abnormality 02/19/2020  . E. coli bacteremia 02/23/2019  . Dysphagia 02/23/2019  . Aspiration pneumonia ([place]) 02/21/2019  . Closed right hip fracture ([place]) 02/16/2019  . Rib pain on right side 09/25/2018  . Skin sore 12/01/2017  . Hematoma 10/18/2017  . Fall at home 10/18/2017  . 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 Radicular pain in right arm 10/09/2016  . GERD (gastroesophageal reflux disease) 07/31/2016  . Trochanteric bursitis 03/03/2016  . Left hand pain 10/30/2015  . Fracture, finger, multiple sites 10/30/2015  . Colon cancer screening 07/23/2015  . Healthcare maintenance 07/23/2015  . Gout 02/18/2015  . Depression 01/06/2015  . Irritation of eyelid 08/20/2014  . Dupuytren's contracture 08/20/2014  . Cough 07/21/2014  . Lumbar stenosis with neurogenic claudication 06/13/2014  . Preop cardiovascular exam 05/28/2014  . S/P deep brain stimulator placement 05/08/2014  . Joint pain 01/22/2014  . Medicare annual wellness visit, initial 01/19/2014  . Advance care planning 01/19/2014  . Parkinson's disease ([place]) 12/13/2013  . PTSD (post-traumatic stress disorder) 06/13/2013  . Erectile dysfunction 06/13/2013  . HLD (hyperlipidemia) 06/13/2013  . Hip pain 06/10/2013  . Pain in joint, shoulder region 06/10/2013  . Right leg swelling 06/03/2013  . Essential hypertension 06/03/2013  . Bradycardia by electrocardiogram 06/03/2013  . Obstructive sleep apnea 03/12/2013  . Spinal stenosis, lumbar region, with neurogenic claudication 12/14/2012    Harrel Carina, MS, OTR/L 03/16/2020, 10:33 AM  [place] MAIN [facility] SERVICES [place], [place], [address] Phone: [phone]   Fax:  [phone]  Name: [name] MRN: [id number] Date of Birth: [birth year]

## 2020-03-16 NOTE — Addendum Note (Signed)
Addended by: Lucia Bitter on: 03/16/2020 11:22 AM   Modules accepted: Orders

## 2020-03-16 NOTE — Therapy (Signed)
[place] MAIN [facility] SERVICES [address] [place], [place], [address] Phone: [phone]   Fax:  [phone]  Physical Therapy Treatment  Patient Details  Name: [name] MRN: [id number] Date of Birth: [birth year] Referring Provider (PT): Sharolyn Douglas MD    Encounter Date: 03/16/2020   PT End of Session - 03/16/20 0928    Visit Number 35    Number of Visits 52    Date for PT Re-Evaluation 04/01/20    Authorization Type 5/10 PN 11/8    PT Start Time 0929    PT Stop Time 1014    PT Time Calculation (min) 45 min    Equipment Utilized During Treatment Gait belt    Activity Tolerance Patient tolerated treatment well    Behavior During Therapy [facility] for tasks assessed/performed           Past Medical History:  Diagnosis Date  . Arthritis   . Bradycardia   . Cancer [facility]) 2013   skin cancer  . Depression    ptsd  . Dysrhythmia    chronic slow heart rate  . GERD (gastroesophageal reflux disease)   . Headache(784.0)    tension headaches non recent  . History of chicken pox   . History of kidney stones    passed  . Hypertension    treated with HCTZ  . Parkinson's disease ([place])    dx'ed 15 years ago  . PTSD (post-traumatic stress disorder)   . Shortness of breath dyspnea   . Sleep apnea    doesn't use C-pap  . Varicose veins     Past Surgical History:  Procedure Laterality Date  . CHOLECYSTECTOMY N/A 10/22/2014   Procedure: LAPAROSCOPIC CHOLECYSTECTOMY WITH INTRAOPERATIVE CHOLANGIOGRAM;  Surgeon: Dia Crawford III, MD;  Location: [facility] ORS;  Service: General;  Laterality: N/A;  . cyst removed      from lip as a child  . INTRAMEDULLARY (IM) NAIL INTERTROCHANTERIC N/A 02/18/2019   Procedure: INTRAMEDULLARY (IM) NAIL INTERTROCHANTRIC, RIGHT,;  Surgeon: Thornton Park, MD;  Location: [facility] ORS;  Service: Orthopedics;  Laterality: N/A;  . LUMBAR LAMINECTOMY/DECOMPRESSION MICRODISCECTOMY Bilateral 12/14/2012   Procedure: Bilateral  lumbar three-four, four-five decompressive laminotomy/foraminotomy;  Surgeon: Charlie Pitter, MD;  Location: [place] [facility] ORS;  Service: Neurosurgery;  Laterality: Bilateral;  . PULSE GENERATOR IMPLANT Bilateral 12/13/2013   Procedure: Bilateral implantable pulse generator placement;  Surgeon: Erline Levine, MD;  Location: [place] [facility] ORS;  Service: Neurosurgery;  Laterality: Bilateral;  Bilateral implantable pulse generator placement  . skin cancer removed     from ears,   12 lft arm  rt leg 15  . SUBTHALAMIC STIMULATOR BATTERY REPLACEMENT Bilateral 07/14/2017   Procedure: BILATERAL IMPLANTED PULSE GENERATOR CHANGE FOR DEEP BRAIN STIMULATOR;  Surgeon: Erline Levine, MD;  Location: [place];  Service: Neurosurgery;  Laterality: Bilateral;  . SUBTHALAMIC STIMULATOR INSERTION Bilateral 12/06/2013   Procedure: SUBTHALAMIC STIMULATOR INSERTION;  Surgeon: Erline Levine, MD;  Location: [place] NEURO ORS;  Service: Neurosurgery;  Laterality: Bilateral;  Bilateral deep brain stimulator placement    There were no vitals filed for this visit.   Subjective Assessment - 03/16/20 0937    Subjective Patient had his deep brain stimulator surgery last week. Had a bad reaction to narcotics but no falls. Has been listing to the left more per wife while recovery. Patient has been sleeping a lot.    Pertinent History [name] is a 69 y.o. male with a hx of HTN, bradycardia, Parkinson's s/p deep  brain stimulator 11/2013 , HLD, depression, possible seizure disorder, arthritis, depression/PTSD, dysrhythmia, GERD, sleep apnea. Patient was seen by this therapist in years past. Patient had a fall around Halloween of 2020 and admitted for ORIF on 02/18/19, was discharged to SNF and had [facility] around Christmas. Had home health therapy for a month and a half. After that tried to go to Outpatient Therapy for 3 sessions at [facility] and was not pleased due to how busy and crowded the location was. Fell last week in bathroom, didn't take walker in,  falls about 2x month. Is very weak in RLE. Patient has a new lift chair and U drive walking. Has a caregiver 4 days a week. Has additional order for radiculopathy, lumbar region. PMH of lumbar laminectomy/decompression microdiscectomy (L3-4, 4-5)  In 2014.  Lumbar 4-5 cage +rod (2016).    Limitations Walking;Standing;Sitting;House hold activities;Lifting    How long can you sit comfortably? not limited with a backrest, without a back rest 3 minutes    How long can you stand comfortably? 3-5 minutes with holding on.    How long can you walk comfortably? with U step 6 minutes.    Patient Stated Goals pain reduction of R hip. strength of legs, dance with wife. push lawnmower.    Currently in Pain? No/denies                 Neuro Re-ed: Cues for sequencing, safety, with close CGA Standing at Bar: airex pad: throw small ball at target CGA and cues for upright posture 20 tosses.  bosu ball: round side up: modified lunges 15x each LE, cues for hip movement rather than trunk hinge.  Forward/backwards big step ambulation 8x length of // bars with 2x/4 between feet with decreasing UE support to no UE support  Speed ladder: one foot each square, cues for widening BOS x 10 lengths of // bars Step over green theraband and back 10x each LE, step and clap close CGA due to near LOB.      TherEx: cues for sequencing, body mechanics Nustep Lvl 4 RPM> 70 for cardiovascular support x 4 minutes Sit to stand with focus on nose over toes, gluteal squeeze at top of stand 10x    Pt educated throughout session about proper posture and technique with exercises. Improved exercise technique, movement at target joints, use of target muscles after min to mod verbal, visual, tactile cues.   Vitals monitored to ensure therapeutic values with rest breaks as needed.                       PT Education - 03/16/20 [phone]    Education provided Yes    Education Details exercise technique, body mechanics     Person(s) Educated Patient    Methods Explanation;Demonstration;Tactile cues;Verbal cues    Comprehension Verbalized understanding;Returned demonstration;Verbal cues required;Tactile cues required            PT Short Term Goals - 01/08/20 1024      PT SHORT TERM GOAL #1   Title Pt will perform HEP with family's/aide's supervision, for improved balance, transfers, and gait.    Baseline 5/5: HEP given 6/28: HEP compliant 8/23: aide helps with HEP    Time 4    Period Weeks    Status Achieved    Target Date 09/19/19             PT Long Term Goals - 02/26/20 0945      PT LONG  TERM GOAL #1   Title Patient will increase Berg Balance score by > 6 points (54/56) to demonstrate decreased fall risk during functional activities.    Baseline 10/20: 48/56 11/10: 49/56    Time 8    Period Weeks    Status Partially Met    Target Date 04/01/20      PT LONG TERM GOAL #2   Title Patient will ambulate within his household for ~ 5 minutes without an AD with no episodes of falling to increase independence with household mobility    Baseline 10/20: requires use of rollator to ambulate 11/10: requires AD    Time 8    Period Weeks    Status On-going    Target Date 04/01/20      PT LONG TERM GOAL #4   Title Patient will increase FOTO score to equal to or greater than 31/100  to demonstrate statistically significant improvement in mobility and quality of life.    Baseline 5/5: 12/100 6/28: 38% 8/23: 11.7 9/22: 45% 10/20: 47.9% 11/10: 40.2%    Time 8    Period Weeks    Status Achieved      PT LONG TERM GOAL #5   Title Patient will increase six minute walk test distance to >1000 for progression to community ambulator and improve gait ability    Baseline 6/28: 750 ft with U step rollator 8/23: 775 w rolling walker, increased crouch pattern of ambulation 9/22: deferred due to pain; 10/18: 862f 11/10: 770 ft    Time 8    Period Weeks    Status On-going    Target Date 04/01/20      PT LONG  TERM GOAL #6   Title Patient will increase Berg Balance score by > 6 points (42/56) to demonstrate decreased fall risk during functional activities.    Baseline 6/28: 36/56 8/23: 42/56 9/22 44/56 10/20: 48/56    Time 8    Period Weeks    Status Achieved      PT LONG TERM GOAL #7   Title Patient will report a worst pain of 3/10 on VAS in low back to improve tolerance with ADLs and reduced symptoms with activities.    Baseline 8/9: 9/10 8/23: 7/10 pain 9/22: 7/10 10/20: 5/10 11/10:  one episode of 9/10 when fell  the rest 4-6/10    Time 8    Period Weeks    Status Partially Met    Target Date 04/01/20      PT LONG TERM GOAL #8   Title Patient will reduce modified Oswestry score to <20 as to demonstrate minimal disability with ADLs including improved sleeping tolerance, walking/sitting tolerance etc for better mobility with ADLs.    Baseline 8/9: 64% 8/23: 66% 9/22:44% 10/20: 40% 11/20: 54%    Time 8    Period Weeks    Status Partially Met    Target Date 04/01/20                 Plan - 03/16/20 1258    Clinical Impression Statement Patient challenged with frequent return to squat position with fatigue. Cues for upright posture tolerated and allows for short duration alignment however requires frequent re-direct. Patient requires more frequent rest breaks this session due to recovery from recent surgery. Patient would benefit from skilled physical therapy to increase strength, stability, balance, and decrease pain to improve quality of life    Personal Factors and Comorbidities Age;Comorbidity 3+;Past/Current Experience;Time since onset of injury/illness/exacerbation;Transportation    Comorbidities  athritis, bradycardia, cancer, depression, dysrhythmia, GERD, HTN, Parkinson's, PTSD, SOB, sleep apnea    Examination-Activity Limitations Bathing;Bed Mobility;Caring for Others;Bend;Dressing;Lift;Locomotion Level;Reach Overhead;Sit;Squat;Stairs;Stand;Toileting;Transfers     Examination-Participation Restrictions Church;Cleaning;Community Activity;Interpersonal Relationship;Shop;Volunteer;Yard Work;Other    Stability/Clinical Decision Making Stable/Uncomplicated    Rehab Potential Fair    PT Frequency 2x / week    PT Duration 8 weeks    PT Treatment/Interventions ADLs/Self Care Home Management;Aquatic Therapy;Cryotherapy;Electrical Stimulation;Iontophoresis 78m/ml Dexamethasone;Moist Heat;Ultrasound;Contrast Bath;DME Instruction;Gait training;Stair training;Functional mobility training;Therapeutic activities;Therapeutic exercise;Balance training;Neuromuscular re-education;Patient/family education;Manual techniques;Wheelchair mobility training;Energy conservation;Passive range of motion;Traction;Orthotic Fit/Training;Dry needling;Vestibular;Visual/perceptual remediation/compensation;Taping;Canalith Repostioning    PT Next Visit Plan core and pain relief    PT Home Exercise Plan [place] Agree with Plan of Care Patient;Family member/caregiver    Family Member Consulted wife           Patient will benefit from skilled therapeutic intervention in order to improve the following deficits and impairments:  Abnormal gait, Decreased balance, Decreased coordination, Decreased mobility, Impaired tone, Postural dysfunction, Decreased strength, Decreased safety awareness, Improper body mechanics, Impaired flexibility, Decreased activity tolerance, Decreased endurance, Decreased knowledge of precautions, Difficulty walking, Pain, Cardiopulmonary status limiting activity, Decreased range of motion, Impaired perceived functional ability  Visit Diagnosis: Muscle weakness (generalized)  Other lack of coordination  Unsteadiness on feet  Repeated falls     Problem List Patient Active Problem List   Diagnosis Date Noted  . Vertigo 02/19/2020  . Urine abnormality 02/19/2020  . E. coli bacteremia 02/23/2019  . Dysphagia 02/23/2019  . [place]iration pneumonia  ([place] 02/21/[place]  . Cl[place] right [place] fracture ([place] 02/16/2019  . Rib pain on right side 09/25/2018  . Skin sore 12/01/2017  . Hematoma 10/18/2017  . Fall at home 10/18/2017  . 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 Radicular pain in right arm 10/09/2016  . GERD (gastroesophageal reflux disease) 07/31/2016  . Trochanteric bursitis 03/03/2016  . Left hand pain 10/30/2015  . Fracture, finger, multiple sites 10/30/2015  . Colon cancer screening 07/23/2015  . Healthcare maintenance 07/23/2015  . Gout 02/18/2015  . Depression 01/06/2015  . Irritation of eyelid 08/20/2014  . Dupuytren's contracture 08/20/2014  . Cough 07/21/2014  . Lumbar stenosis with neurogenic claudication 06/13/2014  . Preop cardiovascular exam 05/28/2014  . S/P deep brain stimulator placement 05/08/2014  . Joint pain 01/22/2014  . Medicare annual wellness visit, initial 01/19/2014  . Advance care planning 01/19/2014  . Parkinson's disease ([place] 12/13/2013  . PTSD (post-traumatic stress disorder) 06/13/2013  . Erectile dysfunction 06/13/2013  . HLD (hyperlipidemia) 06/13/2013  . Hip pain 06/10/2013  . Pain in joint, shoulder region 06/10/2013  . Right leg swelling 06/03/2013  . Essential hypertension 06/03/2013  . Bradycardia by electrocardiogram 06/03/2013  . Obstructive sleep apnea 03/12/2013  . Spinal stenosis, lumbar region, with neurogenic claudication 12/14/2012   MJanna Arch PT, DPT   03/16/2020, 12:59 PM  [facility] [address] [address]Phone: [phone]  Fax:  [phone] Name: George SALTONMRN: [id number] of Birth: [birth year]

## 2020-03-18 ENCOUNTER — Ambulatory Visit: Payer: Medicare PPO | Attending: Neurology

## 2020-03-18 ENCOUNTER — Other Ambulatory Visit: Payer: Self-pay

## 2020-03-18 ENCOUNTER — Encounter: Payer: Self-pay | Admitting: Occupational Therapy

## 2020-03-18 ENCOUNTER — Ambulatory Visit: Payer: Medicare PPO | Admitting: Occupational Therapy

## 2020-03-18 DIAGNOSIS — M6281 Muscle weakness (generalized): Secondary | ICD-10-CM | POA: Diagnosis not present

## 2020-03-18 DIAGNOSIS — R278 Other lack of coordination: Secondary | ICD-10-CM | POA: Diagnosis not present

## 2020-03-18 DIAGNOSIS — G8929 Other chronic pain: Secondary | ICD-10-CM | POA: Diagnosis not present

## 2020-03-18 DIAGNOSIS — R2689 Other abnormalities of gait and mobility: Secondary | ICD-10-CM | POA: Insufficient documentation

## 2020-03-18 DIAGNOSIS — R2681 Unsteadiness on feet: Secondary | ICD-10-CM | POA: Diagnosis not present

## 2020-03-18 DIAGNOSIS — R262 Difficulty in walking, not elsewhere classified: Secondary | ICD-10-CM | POA: Diagnosis not present

## 2020-03-18 DIAGNOSIS — M545 Low back pain, unspecified: Secondary | ICD-10-CM | POA: Insufficient documentation

## 2020-03-18 DIAGNOSIS — R296 Repeated falls: Secondary | ICD-10-CM | POA: Insufficient documentation

## 2020-03-18 DIAGNOSIS — G20 Parkinson's disease: Secondary | ICD-10-CM | POA: Insufficient documentation

## 2020-03-18 DIAGNOSIS — M25551 Pain in right hip: Secondary | ICD-10-CM | POA: Diagnosis not present

## 2020-03-18 DIAGNOSIS — R293 Abnormal posture: Secondary | ICD-10-CM | POA: Insufficient documentation

## 2020-03-18 NOTE — Therapy (Signed)
[place] MAIN [facility] SERVICES [address] [place], [place], [address] Phone: [phone]   Fax:  [phone]  Physical Therapy Treatment  Patient Details  Name: [name] MRN: [id number] Date of Birth: 17-Apr-1951 Referring Provider (PT): Sharolyn Douglas MD    Encounter Date: 03/18/2020   PT End of Session - 03/18/20 0938    Visit Number 36    Number of Visits 52    Date for PT Re-Evaluation 04/01/20    Authorization Type 6/10 PN 11/8    PT Start Time 0930    PT Stop Time 1014    PT Time Calculation (min) 44 min    Equipment Utilized During Treatment Gait belt    Activity Tolerance Patient tolerated treatment well    Behavior During Therapy [facility] for tasks assessed/performed           Past Medical History:  Diagnosis Date  . Arthritis   . Bradycardia   . Cancer [facility]) 2013   skin cancer  . Depression    ptsd  . Dysrhythmia    chronic slow heart rate  . GERD (gastroesophageal reflux disease)   . Headache(784.0)    tension headaches non recent  . History of chicken pox   . History of kidney stones    passed  . Hypertension    treated with HCTZ  . Parkinson's disease ([place])    dx'ed 15 years ago  . PTSD (post-traumatic stress disorder)   . Shortness of breath dyspnea   . Sleep apnea    doesn't use C-pap  . Varicose veins     Past Surgical History:  Procedure Laterality Date  . CHOLECYSTECTOMY N/A 10/22/2014   Procedure: LAPAROSCOPIC CHOLECYSTECTOMY WITH INTRAOPERATIVE CHOLANGIOGRAM;  Surgeon: Dia Crawford III, MD;  Location: [facility] ORS;  Service: General;  Laterality: N/A;  . cyst removed      from lip as a child  . INTRAMEDULLARY (IM) NAIL INTERTROCHANTERIC N/A 02/18/2019   Procedure: INTRAMEDULLARY (IM) NAIL INTERTROCHANTRIC, RIGHT,;  Surgeon: Thornton Park, MD;  Location: [facility] ORS;  Service: Orthopedics;  Laterality: N/A;  . LUMBAR LAMINECTOMY/DECOMPRESSION MICRODISCECTOMY Bilateral 12/14/2012   Procedure: Bilateral  lumbar three-four, four-five decompressive laminotomy/foraminotomy;  Surgeon: Charlie Pitter, MD;  Location: [place] NEURO ORS;  Service: Neurosurgery;  Laterality: Bilateral;  . PULSE GENERATOR IMPLANT Bilateral 12/13/2013   Procedure: Bilateral implantable pulse generator placement;  Surgeon: Erline Levine, MD;  Location: [place] NEURO ORS;  Service: Neurosurgery;  Laterality: Bilateral;  Bilateral implantable pulse generator placement  . skin cancer removed     from ears,   12 lft arm  rt leg 15  . SUBTHALAMIC STIMULATOR BATTERY REPLACEMENT Bilateral 07/14/2017   Procedure: BILATERAL IMPLANTED PULSE GENERATOR CHANGE FOR DEEP BRAIN STIMULATOR;  Surgeon: Erline Levine, MD;  Location: [place];  Service: Neurosurgery;  Laterality: Bilateral;  . SUBTHALAMIC STIMULATOR INSERTION Bilateral 12/06/2013   Procedure: SUBTHALAMIC STIMULATOR INSERTION;  Surgeon: Erline Levine, MD;  Location: [place] NEURO ORS;  Service: Neurosurgery;  Laterality: Bilateral;  Bilateral deep brain stimulator placement    There were no vitals filed for this visit.   Subjective Assessment - 03/18/20 0937    Subjective Patient reports his leg and back is doing a little better.  Is noticing new left knee pain. No falls or LOB since last session.    Pertinent History [name] is a 69 y.o. male with a hx of HTN, bradycardia, Parkinson's s/p deep brain stimulator 11/2013 , HLD, depression, possible seizure disorder, arthritis,  depression/PTSD, dysrhythmia, GERD, sleep apnea. Patient was seen by this therapist in years past. Patient had a fall around Halloween of 2020 and admitted for ORIF on 02/18/19, was discharged to SNF and had [place] around Christmas. Had home health therapy for a month and a half. After that tried to go to Outpatient Therapy for 3 sessions at [facility] and was not pleased due to how busy and crowded the location was. Fell last week in bathroom, didn't take walker in, falls about 2x month. Is very weak in RLE. Patient has a new lift  chair and U drive walking. Has a caregiver 4 days a week. Has additional order for radiculopathy, lumbar region. PMH of lumbar laminectomy/decompression microdiscectomy (L3-4, 4-5)  In 2014.  Lumbar 4-5 cage +rod (2016).    Limitations Walking;Standing;Sitting;House hold activities;Lifting    How long can you sit comfortably? not limited with a backrest, without a back rest 3 minutes    How long can you stand comfortably? 3-5 minutes with holding on.    How long can you walk comfortably? with U step 6 minutes.    Patient Stated Goals pain reduction of R hip. strength of legs, dance with wife. push lawnmower.    Currently in Pain? Yes    Pain Score 4     Pain Location Knee    Pain Orientation Left    Pain Descriptors / Indicators Aching    Pain Type Acute pain    Pain Onset Today    Pain Frequency Intermittent                Neuro Re-ed: Cues for sequencing, safety, with close CGA Standing at Bar: Ambulate with horizontal head turns 2x 60 ft with rollator Ambulate with vertical head turns 2x 60 ft with rollator Cone search with horizontal and vertical head turns. Cues for locking rollator prior to reaching up/down to grab cone and pass to PT across midline x 8 cones x 96 ft  Step over green theraband and back 10x each LE, step and clap close CGA due to near LOB.      TherEx: cues for sequencing, body mechanics Nustep Lvl 4 RPM> 70 for cardiovascular support x 4 minutes Sit to stand with focus on nose over toes, gluteal squeeze at top of stand 10x   4" step lateral step up/down 10x each LE 4" step forward step up/down 10x each LE, SUE support  Sumo squat with bar support 10x   Pt educated throughout session about proper posture and technique with exercises. Improved exercise technique, movement at target joints, use of target muscles after min to mod verbal, visual, tactile cues. Vitals monitored to ensure therapeutic values with rest breaks as needed.    BP: 143/79 pulse 48   Seated: BP: standing: 126/66 pulse 52                     PT Education - 03/18/20 0938    Education provided Yes    Education Details exercise technique, body mechanics    Person(s) Educated Patient    Methods Explanation;Demonstration;Tactile cues;Verbal cues    Comprehension Verbalized understanding;Returned demonstration;Tactile cues required;Verbal cues required            PT Short Term Goals - 01/08/20 1024      PT SHORT TERM GOAL #1   Title Pt will perform HEP with family's/aide's supervision, for improved balance, transfers, and gait.    Baseline 5/5: HEP given 6/28: HEP compliant 8/23: aide  helps with HEP    Time 4    Period Weeks    Status Achieved    Target Date 09/19/19             PT Long Term Goals - 02/26/20 0945      PT LONG TERM GOAL #1   Title Patient will increase Berg Balance score by > 6 points (54/56) to demonstrate decreased fall risk during functional activities.    Baseline 10/20: 48/56 11/10: 49/56    Time 8    Period Weeks    Status Partially Met    Target Date 04/01/20      PT LONG TERM GOAL #2   Title Patient will ambulate within his household for ~ 5 minutes without an AD with no episodes of falling to increase independence with household mobility    Baseline 10/20: requires use of rollator to ambulate 11/10: requires AD    Time 8    Period Weeks    Status On-going    Target Date 04/01/20      PT LONG TERM GOAL #4   Title Patient will increase FOTO score to equal to or greater than 31/100  to demonstrate statistically significant improvement in mobility and quality of life.    Baseline 5/5: 12/100 6/28: 38% 8/23: 11.7 9/22: 45% 10/20: 47.9% 11/10: 40.2%    Time 8    Period Weeks    Status Achieved      PT LONG TERM GOAL #5   Title Patient will increase six minute walk test distance to >1000 for progression to community ambulator and improve gait ability    Baseline 6/28: 750 ft with U step rollator 8/23: 775 w  rolling walker, increased crouch pattern of ambulation 9/22: deferred due to pain; 10/18: 839f 11/10: 770 ft    Time 8    Period Weeks    Status On-going    Target Date 04/01/20      PT LONG TERM GOAL #6   Title Patient will increase Berg Balance score by > 6 points (42/56) to demonstrate decreased fall risk during functional activities.    Baseline 6/28: 36/56 8/23: 42/56 9/22 44/56 10/20: 48/56    Time 8    Period Weeks    Status Achieved      PT LONG TERM GOAL #7   Title Patient will report a worst pain of 3/10 on VAS in low back to improve tolerance with ADLs and reduced symptoms with activities.    Baseline 8/9: 9/10 8/23: 7/10 pain 9/22: 7/10 10/20: 5/10 11/10:  one episode of 9/10 when fell  the rest 4-6/10    Time 8    Period Weeks    Status Partially Met    Target Date 04/01/20      PT LONG TERM GOAL #8   Title Patient will reduce modified Oswestry score to <20 as to demonstrate minimal disability with ADLs including improved sleeping tolerance, walking/sitting tolerance etc for better mobility with ADLs.    Baseline 8/9: 64% 8/23: 66% 9/22:44% 10/20: 40% 11/20: 54%    Time 8    Period Weeks    Status Partially Met    Target Date 04/01/20                 Plan - 03/18/20 1018    Clinical Impression Statement Patient presents with more frequent episodes of "spacing" with one occurring in standing. Significant change in blood pressure between sitting and standing noted. Cardiologist notified. Patient session  limited by frequencies of spacing. Patient would benefit from skilled physical therapy to increase strength, stability, balance, and decrease pain to improve quality of life    Personal Factors and Comorbidities Age;Comorbidity 3+;Past/Current Experience;Time since onset of injury/illness/exacerbation;Transportation    Comorbidities athritis, bradycardia, cancer, depression, dysrhythmia, GERD, HTN, Parkinson's, PTSD, SOB, sleep apnea    Examination-Activity  Limitations Bathing;Bed Mobility;Caring for Others;Bend;Dressing;Lift;Locomotion Level;Reach Overhead;Sit;Squat;Stairs;Stand;Toileting;Transfers    Examination-Participation Restrictions Church;Cleaning;Community Activity;Interpersonal Relationship;Shop;Volunteer;Yard Work;Other    Stability/Clinical Decision Making Stable/Uncomplicated    Rehab Potential Fair    PT Frequency 2x / week    PT Duration 8 weeks    PT Treatment/Interventions ADLs/Self Care Home Management;Aquatic Therapy;Cryotherapy;Electrical Stimulation;Iontophoresis 51m/ml Dexamethasone;Moist Heat;Ultrasound;Contrast Bath;DME Instruction;Gait training;Stair training;Functional mobility training;Therapeutic activities;Therapeutic exercise;Balance training;Neuromuscular re-education;Patient/family education;Manual techniques;Wheelchair mobility training;Energy conservation;Passive range of motion;Traction;Orthotic Fit/Training;Dry needling;Vestibular;Visual/perceptual remediation/compensation;Taping;Canalith Repostioning    PT Next Visit Plan core and pain relief    PT Home Exercise Plan [place] Agree with Plan of Care Patient;Family member/caregiver    Family Member Consulted wife           Patient will benefit from skilled therapeutic intervention in order to improve the following deficits and impairments:  Abnormal gait, Decreased balance, Decreased coordination, Decreased mobility, Impaired tone, Postural dysfunction, Decreased strength, Decreased safety awareness, Improper body mechanics, Impaired flexibility, Decreased activity tolerance, Decreased endurance, Decreased knowledge of precautions, Difficulty walking, Pain, Cardiopulmonary status limiting activity, Decreased range of motion, Impaired perceived functional ability  Visit Diagnosis: Muscle weakness (generalized)  Other lack of coordination  Unsteadiness on feet  Repeated falls     Problem List Patient Active Problem List   Diagnosis Date  Noted  . Vertigo 02/19/2020  . Urine abnormality 02/19/2020  . E. coli bacteremia 02/23/2019  . Dysphagia 02/23/2019  . Aspiration pneumonia ([place] 02/21/2019  . Closed right hip fracture ([place] 02/16/2019  . Rib pain on right side 09/25/2018  . Skin sore 12/01/2017  . Hematoma 10/18/2017  . Fall at home 10/18/2017  . 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 Radicular pain in right arm 10/09/2016  . GERD (gastroesophageal reflux disease) 07/31/2016  . Trochanteric bursitis 03/03/2016  . Left hand pain 10/30/2015  . Fracture, finger, multiple sites 10/30/2015  . Colon cancer screening 07/23/2015  . Healthcare maintenance 07/23/2015  . Gout 02/18/2015  . Depression 01/06/2015  . Irritation of eyelid 08/20/2014  . Dupuytren's contracture 08/20/2014  . Cough 07/21/2014  . Lumbar stenosis with neurogenic claudication 06/13/2014  . Preop cardiovascular exam 05/28/2014  . S/P deep brain stimulator placement 05/08/2014  . Joint pain 01/22/2014  . Medicare annual wellness visit, initial 01/19/2014  . Advance care planning 01/19/2014  . Parkinson's disease ([place] 12/13/2013  . PTSD (post-traumatic stress disorder) 06/13/2013  . Erectile dysfunction 06/13/2013  . HLD (hyperlipidemia) 06/13/2013  . Hip pain 06/10/2013  . Pain in joint, shoulder region 06/10/2013  . Right leg swelling 06/03/2013  . Essential hypertension 06/03/2013  . Bradycardia by electrocardiogram 06/03/2013  . Obstructive sleep apnea 03/12/2013  . Spinal stenosis, lumbar region, with neurogenic claudication 12/14/2012   MJanna Arch PT, DPT   03/18/2020, 10:19 AM  [address]REarlville [place] [address]Phone: [phone]  Fax:  [phone] Name: George CASANOVAMRN: [id number] of Birth: [birth year]

## 2020-03-18 NOTE — Therapy (Signed)
[place] MAIN [facility] SERVICES [address] [place], [place], [address] Phone: [phone]   Fax:  [phone]  Occupational Therapy Progress Note  Dates of reporting period  02/05/2020   to   03/18/2020  Patient Details  Name: [name] MRN: [id number] Date of Birth: 1951-01-30 No data recorded  Encounter Date: 03/18/2020   OT End of Session - 03/18/20 1351    Visit Number 20    Number of Visits 24    Date for OT Re-Evaluation 06/01/20    Authorization Type reporting period starting 02/05/2020    OT Start Time 1018    OT Stop Time 1100    OT Time Calculation (min) 42 min    Activity Tolerance Patient tolerated treatment well    Behavior During Therapy [facility] for tasks assessed/performed           Past Medical History:  Diagnosis Date  . Arthritis   . Bradycardia   . Cancer [facility]) 2013   skin cancer  . Depression    ptsd  . Dysrhythmia    chronic slow heart rate  . GERD (gastroesophageal reflux disease)   . Headache(784.0)    tension headaches non recent  . History of chicken pox   . History of kidney stones    passed  . Hypertension    treated with HCTZ  . Parkinson's disease ([place])    dx'ed 15 years ago  . PTSD (post-traumatic stress disorder)   . Shortness of breath dyspnea   . Sleep apnea    doesn't use C-pap  . Varicose veins     Past Surgical History:  Procedure Laterality Date  . CHOLECYSTECTOMY N/A 10/22/2014   Procedure: LAPAROSCOPIC CHOLECYSTECTOMY WITH INTRAOPERATIVE CHOLANGIOGRAM;  Surgeon: Dia Crawford III, MD;  Location: [facility] ORS;  Service: General;  Laterality: N/A;  . cyst removed      from lip as a child  . INTRAMEDULLARY (IM) NAIL INTERTROCHANTERIC N/A 02/18/2019   Procedure: INTRAMEDULLARY (IM) NAIL INTERTROCHANTRIC, RIGHT,;  Surgeon: Thornton Park, MD;  Location: [facility] ORS;  Service: Orthopedics;  Laterality: N/A;  . LUMBAR LAMINECTOMY/DECOMPRESSION MICRODISCECTOMY Bilateral 12/14/2012   Procedure:  Bilateral lumbar three-four, four-five decompressive laminotomy/foraminotomy;  Surgeon: Charlie Pitter, MD;  Location: [place] [facility] ORS;  Service: Neurosurgery;  Laterality: Bilateral;  . PULSE GENERATOR IMPLANT Bilateral 12/13/2013   Procedure: Bilateral implantable pulse generator placement;  Surgeon: Erline Levine, MD;  Location: [place] [facility] ORS;  Service: Neurosurgery;  Laterality: Bilateral;  Bilateral implantable pulse generator placement  . skin cancer removed     from ears,   12 lft arm  rt leg 15  . SUBTHALAMIC STIMULATOR BATTERY REPLACEMENT Bilateral 07/14/2017   Procedure: BILATERAL IMPLANTED PULSE GENERATOR CHANGE FOR DEEP BRAIN STIMULATOR;  Surgeon: Erline Levine, MD;  Location: [place];  Service: Neurosurgery;  Laterality: Bilateral;  . SUBTHALAMIC STIMULATOR INSERTION Bilateral 12/06/2013   Procedure: SUBTHALAMIC STIMULATOR INSERTION;  Surgeon: Erline Levine, MD;  Location: [place] [facility] ORS;  Service: Neurosurgery;  Laterality: Bilateral;  Bilateral deep brain stimulator placement    There were no vitals filed for this visit.   Subjective Assessment - 03/18/20 1352    Subjective  Pt. reports not feeling as well today having had a dizziness episode with PT this a.m.    Patient is accompanied by: Family member    Pertinent History Pt. is a 69 yo male who has a history of Parkinson's Disease. Pt. has a Deep Brain Stimulator in place. Pt. has a history  of Low Back Pain with 2 back surgeries in August 2014, and February 2016. Pt. has a history of multiple frequent falls, unsure of how many falls in the last 6 months.    Currently in Pain? No/denies          OT TREATMENT   Neuro muscular re-education:  Pt.worked on [facility] skills manipulating nuts, and bolts on a bolt board. Pt.worked on screwing, and unscrewing nuts, and bolts of varying sizes, and challenging progressively smaller items.Pt. required increased to connect the nuts to the bolts with his vision occluded. Pt. worked on UE motor  control, and coordination skills while handling, and using tools. Pt. worked on using crescent wrenches, and screw drivers to remove screws, and bolts of varying sizes.   Pt. continues to make progress overall with UE strength, and [facility] skills Pt. continues to make steady progress towards treatment goals. Pt. Is improving with A/E use for self-dressing, as well as hiking his clothing. Pt. is improving with using his left hand to conhe nuts to the various sized bolts with his vision occluded.  Pt. was able to use his bilateral hands to fasten, and unfasten nuts, and bolts on the bolt board. The board was positioned at the tabletop, however required increased time to complete the task. Pt. continues to work on improving UE strength, and [facility] skills in order to be able to participate in and perform meaningful ADLs, and IADL tasks.                       OT Education - 03/18/20 1351    Education provided Yes    Education Details A/E use, left hand strength    Person(s) Educated Patient;Spouse    Methods Explanation    Comprehension Verbalized understanding               OT Long Term Goals - 03/18/20 1355      OT LONG TERM GOAL #1   Title Pt. will improve Bilateral UE strength by 1 mm grade to assist with ADLs, and IADLs    Baseline BUE strength 4/5 overall at eval    Time 12    Period Weeks    Status On-going    Target Date 06/01/20      OT LONG TERM GOAL #2   Title Pt. will improve grip strength by 10# to open jars and containers.    Baseline Pt. conitnues to have difficulty opening jars    Time 12    Period Weeks    Target Date 06/01/20      OT LONG TERM GOAL #3   Title Patient will complete lower body dressing with modified independence.    Baseline min to mod assist at eval; sock aid supervision, Mod A doff R sock, IND doff L    Time 12    Period Weeks    Status Partially Met    Target Date 06/01/20      OT LONG TERM GOAL #4   Title Patient to demonstrate  the ability to vacuum flooring at home with use of modified techniques with supervision.    Baseline Pt. continues to hav difficulty    Time 12    Period Weeks    Status On-going    Target Date 06/01/20      OT LONG TERM GOAL #5   Title Patient will demonstrate the ability to retrieve a snack and transport it to the living room with modified independence.  Baseline Pt. conitnues to work on transporting items    Time 12    Period Weeks    Status On-going    Target Date 06/11/20      OT LONG TERM GOAL #6   Title Patient will improve hand function to be able to perform cutting of food with modified independence.    Baseline Pt. is improving, however continues to have difficulty    Time 12    Period Weeks    Status Partially Met    Target Date 06/01/20      OT LONG TERM GOAL #7   Title Pt. will write a paragraph efficiently with 90% legibility.    Baseline Limited writing legibility and poor hand endurance for writing more than 1 sentence.    Time 12    Period Weeks    Status On-going    Target Date 06/01/20      OT LONG TERM GOAL #8   Title Pt. will improve typing speed, and accuracy to be able to to type emails efficently    Baseline Continues to be limited    Time 12    Period Weeks    Status On-going    Target Date 06/01/20      OT LONG TERM GOAL  #9   Baseline Pt will transfers in and out of the shower with stand-by assistance    Time 12    Period Weeks    Status On-going    Target Date 06/01/20      OT LONG TERM GOAL  #10   TITLE Pt will don and doff pants with supervision    Baseline min assist to manage pants    Time 12    Period Weeks    Status On-going    Target Date 06/01/20                 Plan - 03/18/20 1352    Clinical Impression Statement Pt. continues to make progress overall with UE strength, and [place] skills Pt. continues to make steady progress towards treatment goals. Pt. Is improving with A/E use for self-dressing, as well as hiking  his clothing. Pt. is improving with using his left hand to conhe nuts to the various sized bolts with his vision occluded.  Pt. was able to use his bilateral hands to fasten, and unfasten nuts, and bolts on the bolt board. The board was positioned at the tabletop, however required increased time to complete the task. Pt. continues to work on improving UE strength, and [employer] [facility] skills in order to be able to participate in and perform meaningful ADLs, and IADL tasks.    OT Occupational Profile and History Detailed Assessment- Review of Records and additional review of physical, cognitive, psychosocial history related to current functional performance    Occupational performance deficits (Please refer to evaluation for details): ADL's;IADL's;Leisure    Body Structure / Function / Physical Skills ADL;Coordination;Endurance;GMC;UE functional use;Balance;Decreased knowledge of use of DME;IADL;Pain;Dexterity;FMC;Strength;Mobility;ROM    Cognitive Skills Attention;Problem Solve;Memory    Psychosocial Skills Coping Strategies;Environmental  Adaptations;Habits;Routines and Behaviors    Rehab Potential Good    Clinical Decision Making Limited treatment options, no task modification necessary    Comorbidities Affecting Occupational Performance: Presence of comorbidities impacting occupational performance    Comorbidities impacting occupational performance description: risk for falls, slow to respond and process information at times, safety    Modification or Assistance to Complete Evaluation  No modification of tasks or assist necessary to complete eval  OT Frequency 2x / week    OT Duration 2 weeks    OT Treatment/Interventions Self-care/ADL training;Therapeutic exercise;DME and/or AE instruction;Energy conservation;Neuromuscular education;Patient/family education;Therapeutic activities;Moist Heat;Functional Mobility Training;Cognitive remediation/compensation    Consulted and Agree with Plan of Care Patient            Patient will benefit from skilled therapeutic intervention in order to improve the following deficits and impairments:   Body Structure / Function / Physical Skills: ADL, Coordination, Endurance, GMC, UE functional use, Balance, Decreased knowledge of use of DME, IADL, Pain, Dexterity, FMC, Strength, Mobility, ROM Cognitive Skills: Attention, Problem Solve, Memory Psychosocial Skills: Coping Strategies, Environmental  Adaptations, Habits, Routines and Behaviors   Visit Diagnosis: Muscle weakness (generalized)  Other lack of coordination    Problem List Patient Active Problem List   Diagnosis Date Noted  . Vertigo 02/19/2020  . Urine abnormality 02/19/2020  . E. coli bacteremia 02/23/2019  . Dysphagia 02/23/2019  . Aspiration pneumonia ([place]) 02/21/2019  . Closed right hip fracture ([place]) 02/16/2019  . Rib pain on right side 09/25/2018  . Skin sore 12/01/2017  . Hematoma 10/18/2017  . Fall at home 10/18/2017  . 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 Radicular pain in right arm 10/09/2016  . GERD (gastroesophageal reflux disease) 07/31/2016  . Trochanteric bursitis 03/03/2016  . Left hand pain 10/30/2015  . Fracture, finger, multiple sites 10/30/2015  . Colon cancer screening 07/23/2015  . Healthcare maintenance 07/23/2015  . Gout 02/18/2015  . Depression 01/06/2015  . Irritation of eyelid 08/20/2014  . Dupuytren's contracture 08/20/2014  . Cough 07/21/2014  . Lumbar stenosis with neurogenic claudication 06/13/2014  . Preop cardiovascular exam 05/28/2014  . S/P deep brain stimulator placement 05/08/2014  . Joint pain 01/22/2014  . Medicare annual wellness visit, initial 01/19/2014  . Advance care planning 01/19/2014  . Parkinson's disease ([place]) 12/13/2013  . PTSD (post-traumatic stress disorder) 06/13/2013  . Erectile dysfunction 06/13/2013  . HLD (hyperlipidemia) 06/13/2013  . Hip pain 06/10/2013  . Pain in joint, shoulder region 06/10/2013  . Right leg  swelling 06/03/2013  . Essential hypertension 06/03/2013  . Bradycardia by electrocardiogram 06/03/2013  . Obstructive sleep apnea 03/12/2013  . Spinal stenosis, lumbar region, with neurogenic claudication 12/14/2012    Harrel Carina, MS, OTR/L 03/18/2020, 4:13 PM  [place] MAIN [facility] SERVICES [address] [place], [place], [address] Phone: [phone]   Fax:  [phone]  Name: KAO CONRY MRN: [id number] Date of Birth: 1950-07-12

## 2020-03-23 ENCOUNTER — Ambulatory Visit: Payer: Medicare PPO | Admitting: Occupational Therapy

## 2020-03-23 ENCOUNTER — Encounter: Payer: Self-pay | Admitting: Occupational Therapy

## 2020-03-23 ENCOUNTER — Other Ambulatory Visit: Payer: Self-pay

## 2020-03-23 ENCOUNTER — Ambulatory Visit: Payer: Medicare PPO

## 2020-03-23 DIAGNOSIS — R2681 Unsteadiness on feet: Secondary | ICD-10-CM | POA: Diagnosis not present

## 2020-03-23 DIAGNOSIS — R296 Repeated falls: Secondary | ICD-10-CM | POA: Diagnosis not present

## 2020-03-23 DIAGNOSIS — G8929 Other chronic pain: Secondary | ICD-10-CM | POA: Diagnosis not present

## 2020-03-23 DIAGNOSIS — R278 Other lack of coordination: Secondary | ICD-10-CM | POA: Diagnosis not present

## 2020-03-23 DIAGNOSIS — R262 Difficulty in walking, not elsewhere classified: Secondary | ICD-10-CM | POA: Diagnosis not present

## 2020-03-23 DIAGNOSIS — M545 Low back pain, unspecified: Secondary | ICD-10-CM | POA: Diagnosis not present

## 2020-03-23 DIAGNOSIS — R2689 Other abnormalities of gait and mobility: Secondary | ICD-10-CM | POA: Diagnosis not present

## 2020-03-23 DIAGNOSIS — M6281 Muscle weakness (generalized): Secondary | ICD-10-CM | POA: Diagnosis not present

## 2020-03-23 DIAGNOSIS — M25551 Pain in right hip: Secondary | ICD-10-CM | POA: Diagnosis not present

## 2020-03-23 NOTE — Therapy (Signed)
[place] MAIN [facility] [address] [place], [place], [address] Phone: [phone]   Fax:  [phone]  Physical Therapy Treatment  Patient Details  Name: [name] MRN: [id number] Date of Birth: 1950/12/20 Referring Provider (PT): Sharolyn Douglas MD    Encounter Date: 03/23/2020   PT End of Session - 03/23/20 1027    Visit Number 37    Number of Visits 52    Date for PT Re-Evaluation 04/01/20    Authorization Type 7/10 PN 11/8    PT Start Time 0931    PT Stop Time 1015    PT Time Calculation (min) 44 min    Equipment Utilized During Treatment Gait belt    Activity Tolerance Patient tolerated treatment well    Behavior During Therapy [facility] for tasks assessed/performed           Past Medical History:  Diagnosis Date  . Arthritis   . Bradycardia   . Cancer [facility]) 2013   skin cancer  . Depression    ptsd  . Dysrhythmia    chronic slow heart rate  . GERD (gastroesophageal reflux disease)   . Headache(784.0)    tension headaches non recent  . History of chicken pox   . History of kidney stones    passed  . Hypertension    treated with HCTZ  . Parkinson's disease ([place])    dx'ed 15 years ago  . PTSD (post-traumatic stress disorder)   . Shortness of breath dyspnea   . Sleep apnea    doesn't use C-pap  . Varicose veins     Past Surgical History:  Procedure Laterality Date  . CHOLECYSTECTOMY N/A 10/22/2014   Procedure: LAPAROSCOPIC CHOLECYSTECTOMY WITH INTRAOPERATIVE CHOLANGIOGRAM;  Surgeon: Dia Crawford III, MD;  Location: [facility] ORS;  Service: General;  Laterality: N/A;  . cyst removed      from lip as a child  . INTRAMEDULLARY (IM) NAIL INTERTROCHANTERIC N/A 02/18/2019   Procedure: INTRAMEDULLARY (IM) NAIL INTERTROCHANTRIC, RIGHT,;  Surgeon: Thornton Park, MD;  Location: [facility] ORS;  Service: Orthopedics;  Laterality: N/A;  . LUMBAR LAMINECTOMY/DECOMPRESSION MICRODISCECTOMY Bilateral 12/14/2012   Procedure: Bilateral  lumbar three-four, four-five decompressive laminotomy/foraminotomy;  Surgeon: Charlie Pitter, MD;  Location: [place] NEURO ORS;  Service: Neurosurgery;  Laterality: Bilateral;  . PULSE GENERATOR IMPLANT Bilateral 12/13/2013   Procedure: Bilateral implantable pulse generator placement;  Surgeon: Erline Levine, MD;  Location: [place] [facility] ORS;  Service: Neurosurgery;  Laterality: Bilateral;  Bilateral implantable pulse generator placement  . skin cancer removed     from ears,   12 lft arm  rt leg 15  . SUBTHALAMIC STIMULATOR BATTERY REPLACEMENT Bilateral 07/14/2017   Procedure: BILATERAL IMPLANTED PULSE GENERATOR CHANGE FOR DEEP BRAIN STIMULATOR;  Surgeon: Erline Levine, MD;  Location: [place];  Service: Neurosurgery;  Laterality: Bilateral;  . SUBTHALAMIC STIMULATOR INSERTION Bilateral 12/06/2013   Procedure: SUBTHALAMIC STIMULATOR INSERTION;  Surgeon: Erline Levine, MD;  Location: [place] NEURO ORS;  Service: Neurosurgery;  Laterality: Bilateral;  Bilateral deep brain stimulator placement    There were no vitals filed for this visit.   Subjective Assessment - 03/23/20 1025    Subjective Patient reports one fall yesterday when sitting on his walker while trying to put his shirt on. No pain due to the short distance down to the ground.    Pertinent History [name] is a 69 y.o. male with a hx of HTN, bradycardia, Parkinson's s/p deep brain stimulator 11/2013 , HLD, depression, possible  seizure disorder, arthritis, depression/PTSD, dysrhythmia, GERD, sleep apnea. Patient was seen by this therapist in years past. Patient had a fall around Halloween of 2020 and admitted for ORIF on 02/18/19, was discharged to SNF and had [facility] [place] around Christmas. Had home health therapy for a month and a half. After that tried to go to Outpatient Therapy for 3 sessions at [facility] and was not pleased due to how busy and crowded the location was. Fell last week in bathroom, didn't take walker in, falls about 2x month. Is very weak in RLE.  Patient has a new lift chair and U drive walking. Has a caregiver 4 days a week. Has additional order for radiculopathy, lumbar region. PMH of lumbar laminectomy/decompression microdiscectomy (L3-4, 4-5)  In 2014.  Lumbar 4-5 cage +rod (2016).    Limitations Walking;Standing;Sitting;House hold activities;Lifting    How long can you sit comfortably? not limited with a backrest, without a back rest 3 minutes    How long can you stand comfortably? 3-5 minutes with holding on.    How long can you walk comfortably? with U step 6 minutes.    Patient Stated Goals pain reduction of R hip. strength of legs, dance with wife. push lawnmower.    Currently in Pain? No/denies               TherEx hamstring curl:  -bilateral LE's #5; 12x ; 2 sets cues for slow controlled movement -Concentric bilateral, eccentric single limb #3; 10x each LE, 2 sets  Quad extension - bilateral LE's #5; 12x ; 2 sets cues for slow controlled movement -Concentric bilateral, eccentric single limb #2; 10x each LE, 2 sets  half foam roller df/pf 20x ; BUE support  Ambulate 40 ft x 4 trials with focus on widening BOS and foot clearance   Neuro Re-ed: airex pad: modified tandem stance 2x 60 second holds with cues for upright posture Orange hurdle step over with UE support 10x each LE Orange hurdle lateral step over with UE support 10x each LE    Pt educated throughout session about proper posture and technique with exercises. Improved exercise technique, movement at target joints, use of target muscles after min to mod verbal, visual, tactile cues. Vitals monitored to ensure therapeutic values with rest breaks as needed               PT Education - 03/23/20 1027    Education provided Yes    Education Details exercies technique, body mechanics    Person(s) Educated Patient    Methods Explanation;Demonstration;Tactile cues;Verbal cues    Comprehension Verbalized understanding;Returned demonstration;Verbal  cues required;Tactile cues required            PT Short Term Goals - 01/08/20 1024      PT SHORT TERM GOAL #1   Title Pt will perform HEP with family's/aide's supervision, for improved balance, transfers, and gait.    Baseline 5/5: HEP given 6/28: HEP compliant 8/23: aide helps with HEP    Time 4    Period Weeks    Status Achieved    Target Date 09/19/19             PT Long Term Goals - 02/26/20 0945      PT LONG TERM GOAL #1   Title Patient will increase Berg Balance score by > 6 points (54/56) to demonstrate decreased fall risk during functional activities.    Baseline 10/20: 48/56 11/10: 49/56    Time 8    Period Weeks  Status Partially Met    Target Date 04/01/20      PT LONG TERM GOAL #2   Title Patient will ambulate within his household for ~ 5 minutes without an AD with no episodes of falling to increase independence with household mobility    Baseline 10/20: requires use of rollator to ambulate 11/10: requires AD    Time 8    Period Weeks    Status On-going    Target Date 04/01/20      PT LONG TERM GOAL #4   Title Patient will increase FOTO score to equal to or greater than 31/100  to demonstrate statistically significant improvement in mobility and quality of life.    Baseline 5/5: 12/100 6/28: 38% 8/23: 11.7 9/22: 45% 10/20: 47.9% 11/10: 40.2%    Time 8    Period Weeks    Status Achieved      PT LONG TERM GOAL #5   Title Patient will increase six minute walk test distance to >1000 for progression to community ambulator and improve gait ability    Baseline 6/28: 750 ft with U step rollator 8/23: 775 w rolling walker, increased crouch pattern of ambulation 9/22: deferred due to pain; 10/18: 865ft 11/10: 770 ft    Time 8    Period Weeks    Status On-going    Target Date 04/01/20      PT LONG TERM GOAL #6   Title Patient will increase Berg Balance score by > 6 points (42/56) to demonstrate decreased fall risk during functional activities.    Baseline  6/28: 36/56 8/23: 42/56 9/22 44/56 10/20: 48/56    Time 8    Period Weeks    Status Achieved      PT LONG TERM GOAL #7   Title Patient will report a worst pain of 3/10 on VAS in low back to improve tolerance with ADLs and reduced symptoms with activities.    Baseline 8/9: 9/10 8/23: 7/10 pain 9/22: 7/10 10/20: 5/10 11/10:  one episode of 9/10 when fell  the rest 4-6/10    Time 8    Period Weeks    Status Partially Met    Target Date 04/01/20      PT LONG TERM GOAL #8   Title Patient will reduce modified Oswestry score to <20 as to demonstrate minimal disability with ADLs including improved sleeping tolerance, walking/sitting tolerance etc for better mobility with ADLs.    Baseline 8/9: 64% 8/23: 66% 9/22:44% 10/20: 40% 11/20: 54%    Time 8    Period Weeks    Status Partially Met    Target Date 04/01/20                 Plan - 03/23/20 1029    Clinical Impression Statement Patient presents with excellent motivation despite recent tumble while donning clothing. Education and performance of strengthening interventions tolerated well with patient eager to participate in interventions utilizing gym equipment. Additional interventions encouraging foot clearance tolerated well. Patient would benefit from skilled physical therapy to increase strength, stability, balance, and decrease pain to improve quality of life    Personal Factors and Comorbidities Age;Comorbidity 3+;Past/Current Experience;Time since onset of injury/illness/exacerbation;Transportation    Comorbidities athritis, bradycardia, cancer, depression, dysrhythmia, GERD, HTN, Parkinson's, PTSD, SOB, sleep apnea    Examination-Activity Limitations Bathing;Bed Mobility;Caring for Others;Bend;Dressing;Lift;Locomotion Level;Reach Overhead;Sit;Squat;Stairs;Stand;Toileting;Transfers    Examination-Participation Restrictions Church;Cleaning;Community Activity;Interpersonal Relationship;Shop;Volunteer;Yard Work;Other     Stability/Clinical Decision Making Stable/Uncomplicated    Rehab Potential Fair  PT Frequency 2x / week    PT Duration 8 weeks    PT Treatment/Interventions ADLs/Self Care Home Management;Aquatic Therapy;Cryotherapy;Electrical Stimulation;Iontophoresis 4mg /ml Dexamethasone;Moist Heat;Ultrasound;Contrast Bath;DME Instruction;Gait training;Stair training;Functional mobility training;Therapeutic activities;Therapeutic exercise;Balance training;Neuromuscular re-education;Patient/family education;Manual techniques;Wheelchair mobility training;Energy conservation;Passive range of motion;Traction;Orthotic Fit/Training;Dry needling;Vestibular;Visual/perceptual remediation/compensation;Taping;Canalith Repostioning    PT Next Visit Plan core and pain relief    PT Home Exercise Plan [place] and Agree with Plan of Care Patient;Family member/caregiver    Family Member Consulted wife           Patient will benefit from skilled therapeutic intervention in order to improve the following deficits and impairments:  Abnormal gait, Decreased balance, Decreased coordination, Decreased mobility, Impaired tone, Postural dysfunction, Decreased strength, Decreased safety awareness, Improper body mechanics, Impaired flexibility, Decreased activity tolerance, Decreased endurance, Decreased knowledge of precautions, Difficulty walking, Pain, Cardiopulmonary status limiting activity, Decreased range of motion, Impaired perceived functional ability  Visit Diagnosis: Muscle weakness (generalized)  Other lack of coordination  Unsteadiness on feet  Repeated falls     Problem List Patient Active Problem List   Diagnosis Date Noted  . Vertigo 02/19/2020  . Urine abnormality 02/19/2020  . E. coli bacteremia 02/23/2019  . Dysphagia 02/23/2019  . Aspiration pneumonia ([place]) 02/21/2019  . Closed right hip fracture ([place]) 02/16/2019  . Rib pain on right side 09/25/2018  . Skin sore 12/01/2017  .  Hematoma 10/18/2017  . Fall at home 10/18/2017  . 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 Radicular pain in right arm 10/09/2016  . GERD (gastroesophageal reflux disease) 07/31/2016  . Trochanteric bursitis 03/03/2016  . Left hand pain 10/30/2015  . Fracture, finger, multiple sites 10/30/2015  . Colon cancer screening 07/23/2015  . Healthcare maintenance 07/23/2015  . Gout 02/18/2015  . Depression 01/06/2015  . Irritation of eyelid 08/20/2014  . Dupuytren's contracture 08/20/2014  . Cough 07/21/2014  . Lumbar stenosis with neurogenic claudication 06/13/2014  . Preop cardiovascular exam 05/28/2014  . S/P deep brain stimulator placement 05/08/2014  . Joint pain 01/22/2014  . Medicare annual wellness visit, initial 01/19/2014  . Advance care planning 01/19/2014  . Parkinson's disease ([place]) 12/13/2013  . PTSD (post-traumatic stress disorder) 06/13/2013  . Erectile dysfunction 06/13/2013  . HLD (hyperlipidemia) 06/13/2013  . Hip pain 06/10/2013  . Pain in joint, shoulder region 06/10/2013  . Right leg swelling 06/03/2013  . Essential hypertension 06/03/2013  . Bradycardia by electrocardiogram 06/03/2013  . Obstructive sleep apnea 03/12/2013  . Spinal stenosis, lumbar region, with neurogenic claudication 12/14/2012   Janna Arch, PT, DPT   03/23/2020, 10:30 AM  [place] MAIN [facility] [address] [place], [place], [address] Phone: [phone]   Fax:  [phone]  Name: [name] MRN: [id number] Date of Birth: [birth year]

## 2020-03-23 NOTE — Therapy (Signed)
[place] MAIN [facility] SERVICES [address] [place], [place], [address] Phone: [phone]   Fax:  [phone]  Occupational Therapy Treatment  Patient Details  Name: [name] MRN: [id number] Date of Birth: 11-30-1950 No data recorded  Encounter Date: 03/23/2020   OT End of Session - 03/23/20 1028    Visit Number 21    Number of Visits 24    Date for OT Re-Evaluation 06/01/20    Authorization Type reporting period starting 02/05/2020    OT Start Time 1015    OT Stop Time 1100    OT Time Calculation (min) 45 min    Activity Tolerance Patient tolerated treatment well    Behavior During Therapy [facility] for tasks assessed/performed           Past Medical History:  Diagnosis Date  . Arthritis   . Bradycardia   . Cancer [facility]) 2013   skin cancer  . Depression    ptsd  . Dysrhythmia    chronic slow heart rate  . GERD (gastroesophageal reflux disease)   . Headache(784.0)    tension headaches non recent  . History of chicken pox   . History of kidney stones    passed  . Hypertension    treated with HCTZ  . Parkinson's disease ([place])    dx'ed 15 years ago  . PTSD (post-traumatic stress disorder)   . Shortness of breath dyspnea   . Sleep apnea    doesn't use C-pap  . Varicose veins     Past Surgical History:  Procedure Laterality Date  . CHOLECYSTECTOMY N/A 10/22/2014   Procedure: LAPAROSCOPIC CHOLECYSTECTOMY WITH INTRAOPERATIVE CHOLANGIOGRAM;  Surgeon: Dia Crawford III, MD;  Location: [facility] ORS;  Service: General;  Laterality: N/A;  . cyst removed      from lip as a child  . INTRAMEDULLARY (IM) NAIL INTERTROCHANTERIC N/A 02/18/2019   Procedure: INTRAMEDULLARY (IM) NAIL INTERTROCHANTRIC, RIGHT,;  Surgeon: Thornton Park, MD;  Location: [facility] ORS;  Service: Orthopedics;  Laterality: N/A;  . LUMBAR LAMINECTOMY/DECOMPRESSION MICRODISCECTOMY Bilateral 12/14/2012   Procedure: Bilateral lumbar three-four, four-five decompressive  laminotomy/foraminotomy;  Surgeon: Charlie Pitter, MD;  Location: [place] NEURO ORS;  Service: Neurosurgery;  Laterality: Bilateral;  . PULSE GENERATOR IMPLANT Bilateral 12/13/2013   Procedure: Bilateral implantable pulse generator placement;  Surgeon: Erline Levine, MD;  Location: [place] NEURO ORS;  Service: Neurosurgery;  Laterality: Bilateral;  Bilateral implantable pulse generator placement  . skin cancer removed     from ears,   12 lft arm  rt leg 15  . SUBTHALAMIC STIMULATOR BATTERY REPLACEMENT Bilateral 07/14/2017   Procedure: BILATERAL IMPLANTED PULSE GENERATOR CHANGE FOR DEEP BRAIN STIMULATOR;  Surgeon: Erline Levine, MD;  Location: [place];  Service: Neurosurgery;  Laterality: Bilateral;  . SUBTHALAMIC STIMULATOR INSERTION Bilateral 12/06/2013   Procedure: SUBTHALAMIC STIMULATOR INSERTION;  Surgeon: Erline Levine, MD;  Location: [place] NEURO ORS;  Service: Neurosurgery;  Laterality: Bilateral;  Bilateral deep brain stimulator placement    There were no vitals filed for this visit.   Subjective Assessment - 03/23/20 1027    Subjective  Pt. reports that he went out to eat several times this weekend.    Patient is accompanied by: Family member    Pertinent History Pt. is a 69 yo male who has a history of Parkinson's Disease. Pt. has a Deep Brain Stimulator in place. Pt. has a history of Low Back Pain with 2 back surgeries in August 2014, and February 2016. Pt. has a history  of multiple frequent falls, unsure of how many falls in the last 6 months.    Currently in Pain? No/denies          Self-care:  Pt. worked on Media planner with a simulated check writing task. Pt. was able maintain a mature grasp on the pen 100% of the time using a pen grip. Pt. 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  Pt. performed writing legibility tasks for simulated check writing, and basci application completion. Pt. Completed the the check writing tasks  In  cursive with 75% legibility with minimal deviation above the line. 50% legibility for filling out an application requiring increased time to complete. Pt. continues to make steady progress overall with ADL, and IADL functioning. Pt. has had multiple falls, and requires continued work to improve self dressing, toileting safety, writing, and typing skills.                     OT Education - 03/23/20 1028    Education provided Yes    Education Details A/E use, left hand strength    Person(s) Educated Patient;Spouse    Methods Explanation    Comprehension Verbalized understanding               OT Long Term Goals - 03/18/20 1355      OT LONG TERM GOAL #1   Title Pt. will improve Bilateral UE strength by 1 mm grade to assist with ADLs, and IADLs    Baseline BUE strength 4/5 overall at eval    Time 12    Period Weeks    Status On-going    Target Date 06/01/20      OT LONG TERM GOAL #2   Title Pt. will improve grip strength by 10# to open jars and containers.    Baseline Pt. conitnues to have difficulty opening jars    Time 12    Period Weeks    Target Date 06/01/20      OT LONG TERM GOAL #3   Title Patient will complete lower body dressing with modified independence.    Baseline min to mod assist at eval; sock aid supervision, Mod A doff R sock, IND doff L    Time 12    Period Weeks    Status Partially Met    Target Date 06/01/20      OT LONG TERM GOAL #4   Title Patient to demonstrate the ability to vacuum flooring at home with use of modified techniques with supervision.    Baseline Pt. continues to hav difficulty    Time 12    Period Weeks    Status On-going    Target Date 06/01/20      OT LONG TERM GOAL #5   Title Patient will demonstrate the ability to retrieve a snack and transport it to the living room with modified independence.    Baseline Pt. conitnues to work on transporting items    Time 12    Period Weeks    Status On-going    Target  Date 06/11/20      OT LONG TERM GOAL #6   Title Patient will improve hand function to be able to perform cutting of food with modified independence.    Baseline Pt. is improving, however continues to have difficulty    Time 12    Period Weeks    Status Partially Met    Target Date 06/01/20  OT LONG TERM GOAL #7   Title Pt. will write a paragraph efficiently with 90% legibility.    Baseline Limited writing legibility and poor hand endurance for writing more than 1 sentence.    Time 12    Period Weeks    Status On-going    Target Date 06/01/20      OT LONG TERM GOAL #8   Title Pt. will improve typing speed, and accuracy to be able to to type emails efficently    Baseline Continues to be limited    Time 12    Period Weeks    Status On-going    Target Date 06/01/20      OT LONG TERM GOAL  #9   Baseline Pt will transfers in and out of the shower with stand-by assistance    Time 12    Period Weeks    Status On-going    Target Date 06/01/20      OT LONG TERM GOAL  #10   TITLE Pt will don and doff pants with supervision    Baseline min assist to manage pants    Time 12    Period Weeks    Status On-going    Target Date 06/01/20                 Plan - 03/23/20 1029    Clinical Impression Statement Pt. performed writing legibility tasks for simulated check writing, and basci application completion. Pt. Completed the the check writing tasks  In cursive with 75% legibility with minimal deviation above the line. 50% legibility for filling out an application requiring increased time to complete. Pt. continues to make steady progress overall with ADL, and IADL functioning. Pt. has had multiple falls, and requires continued work to improve self dressing, toileting safety, writing, and typing skills.   OT Occupational Profile and History Detailed Assessment- Review of Records and additional review of physical, cognitive, psychosocial history related to current functional  performance    Occupational performance deficits (Please refer to evaluation for details): ADL's;IADL's;Leisure    Body Structure / Function / Physical Skills ADL;Coordination;Endurance;GMC;UE functional use;Balance;Decreased knowledge of use of DME;IADL;Pain;Dexterity;FMC;Strength;Mobility;ROM    Cognitive Skills Attention;Problem Solve;Memory    Psychosocial Skills Coping Strategies;Environmental  Adaptations;Habits;Routines and Behaviors    Rehab Potential Good    Comorbidities Affecting Occupational Performance: Presence of comorbidities impacting occupational performance    Comorbidities impacting occupational performance description: risk for falls, slow to respond and process information at times, safety    Modification or Assistance to Complete Evaluation  No modification of tasks or assist necessary to complete eval    OT Frequency 2x / week    OT Duration 2 weeks    OT Treatment/Interventions Self-care/ADL training;Therapeutic exercise;DME and/or AE instruction;Energy conservation;Neuromuscular education;Patient/family education;Therapeutic activities;Moist Heat;Functional Mobility Training;Cognitive remediation/compensation    Consulted and Agree with Plan of Care Patient           Patient will benefit from skilled therapeutic intervention in order to improve the following deficits and impairments:   Body Structure / Function / Physical Skills: ADL, Coordination, Endurance, GMC, UE functional use, Balance, Decreased knowledge of use of DME, IADL, Pain, Dexterity, FMC, Strength, Mobility, ROM Cognitive Skills: Attention, Problem Solve, Memory Psychosocial Skills: Coping Strategies, Environmental  Adaptations, Habits, Routines and Behaviors   Visit Diagnosis: No diagnosis found.    Problem List Patient Active Problem List   Diagnosis Date Noted  . Vertigo 02/19/2020  . Urine abnormality 02/19/2020  . E. coli bacteremia 02/23/2019  .  Dysphagia 02/23/2019  . Aspiration  pneumonia ([place]) 02/21/2019  . Closed right hip fracture ([name]) 02/16/2019  . Rib pain on right side 09/25/2018  . Skin sore 12/01/2017  . Hematoma 10/18/2017  . Fall at home 10/18/2017  . 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 Radicular pain in right arm 10/09/2016  . GERD (gastroesophageal reflux disease) 07/31/2016  . Trochanteric bursitis 03/03/2016  . Left hand pain 10/30/2015  . Fracture, finger, multiple sites 10/30/2015  . Colon cancer screening 07/23/2015  . Healthcare maintenance 07/23/2015  . Gout 02/18/2015  . Depression 01/06/2015  . Irritation of eyelid 08/20/2014  . Dupuytren's contracture 08/20/2014  . Cough 07/21/2014  . Lumbar stenosis with neurogenic claudication 06/13/2014  . Preop cardiovascular exam 05/28/2014  . S/P deep brain stimulator placement 05/08/2014  . Joint pain 01/22/2014  . Medicare annual wellness visit, initial 01/19/2014  . Advance care planning 01/19/2014  . Parkinson's disease ([place]) 12/13/2013  . PTSD (post-traumatic stress disorder) 06/13/2013  . Erectile dysfunction 06/13/2013  . HLD (hyperlipidemia) 06/13/2013  . Hip pain 06/10/2013  . Pain in joint, shoulder region 06/10/2013  . Right leg swelling 06/03/2013  . Essential hypertension 06/03/2013  . Bradycardia by electrocardiogram 06/03/2013  . Obstructive sleep apnea 03/12/2013  . Spinal stenosis, lumbar region, with neurogenic claudication 12/14/2012    Harrel Carina, MS, OTR/L 03/23/2020, 10:30 AM  [place] MAIN [facility] SERVICES [place], [place], [address] Phone: [phone]   Fax:  [phone]  Name: CLARK CUFF MRN: [id number] Date of Birth: 12/02/1950

## 2020-03-25 ENCOUNTER — Ambulatory Visit: Payer: Medicare PPO

## 2020-03-25 ENCOUNTER — Ambulatory Visit: Payer: Medicare PPO | Admitting: Occupational Therapy

## 2020-03-26 DIAGNOSIS — R569 Unspecified convulsions: Secondary | ICD-10-CM | POA: Diagnosis not present

## 2020-03-30 ENCOUNTER — Ambulatory Visit: Payer: Medicare PPO

## 2020-03-30 ENCOUNTER — Other Ambulatory Visit: Payer: Self-pay

## 2020-03-30 ENCOUNTER — Ambulatory Visit (INDEPENDENT_AMBULATORY_CARE_PROVIDER_SITE_OTHER): Payer: Medicare PPO | Admitting: Psychology

## 2020-03-30 ENCOUNTER — Encounter: Payer: Self-pay | Admitting: Occupational Therapy

## 2020-03-30 ENCOUNTER — Ambulatory Visit: Payer: Medicare PPO | Admitting: Occupational Therapy

## 2020-03-30 DIAGNOSIS — M6281 Muscle weakness (generalized): Secondary | ICD-10-CM | POA: Diagnosis not present

## 2020-03-30 DIAGNOSIS — F4323 Adjustment disorder with mixed anxiety and depressed mood: Secondary | ICD-10-CM | POA: Diagnosis not present

## 2020-03-30 DIAGNOSIS — R262 Difficulty in walking, not elsewhere classified: Secondary | ICD-10-CM

## 2020-03-30 DIAGNOSIS — F331 Major depressive disorder, recurrent, moderate: Secondary | ICD-10-CM

## 2020-03-30 DIAGNOSIS — R2689 Other abnormalities of gait and mobility: Secondary | ICD-10-CM | POA: Diagnosis not present

## 2020-03-30 DIAGNOSIS — G8929 Other chronic pain: Secondary | ICD-10-CM | POA: Diagnosis not present

## 2020-03-30 DIAGNOSIS — R296 Repeated falls: Secondary | ICD-10-CM | POA: Diagnosis not present

## 2020-03-30 DIAGNOSIS — M25551 Pain in right hip: Secondary | ICD-10-CM | POA: Diagnosis not present

## 2020-03-30 DIAGNOSIS — M545 Low back pain, unspecified: Secondary | ICD-10-CM | POA: Diagnosis not present

## 2020-03-30 DIAGNOSIS — R278 Other lack of coordination: Secondary | ICD-10-CM

## 2020-03-30 DIAGNOSIS — R2681 Unsteadiness on feet: Secondary | ICD-10-CM | POA: Diagnosis not present

## 2020-03-30 NOTE — Therapy (Signed)
[name] [facility] SERVICES [address] [place], [place], [address] Phone: [phone]   Fax:  [phone]  Physical Therapy Treatment  Patient Details  Name: [name] MRN: [id number] Date of Birth: 1951/03/12 Referring Provider (PT): [name]    Encounter Date: 03/30/2020   PT End of Session - 03/30/20 1005    Visit Number 38    Number of Visits 52    Date for PT Re-Evaluation 04/01/20    Authorization Type 7/10 PN 11/8    Authorization Time Period FOTO- PT    PT Start Time 0934    PT Stop Time 1015    PT Time Calculation (min) 41 min    Equipment Utilized During Treatment Gait belt    Activity Tolerance Patient tolerated treatment well;No increased pain    Behavior During Therapy WFL for tasks assessed/performed           Past Medical History:  Diagnosis Date  . Arthritis   . Bradycardia   . Cancer [facility]) 2013   skin cancer  . Depression    ptsd  . Dysrhythmia    chronic slow heart rate  . GERD (gastroesophageal reflux disease)   . Headache(784.0)    tension headaches non recent  . History of chicken pox   . History of kidney stones    passed  . Hypertension    treated with HCTZ  . Parkinson's disease ([place])    dx'ed 15 years ago  . PTSD (post-traumatic stress disorder)   . Shortness of breath dyspnea   . Sleep apnea    doesn't use C-pap  . Varicose veins     Past Surgical History:  Procedure Laterality Date  . CHOLECYSTECTOMY N/A 10/22/2014   Procedure: LAPAROSCOPIC CHOLECYSTECTOMY WITH INTRAOPERATIVE CHOLANGIOGRAM;  Surgeon: Dia Crawford III, Mcgee;  Location: [facility] ORS;  Service: General;  Laterality: N/A;  . cyst removed      from lip as a child  . INTRAMEDULLARY (IM) NAIL INTERTROCHANTERIC N/A 02/18/2019   Procedure: INTRAMEDULLARY (IM) NAIL INTERTROCHANTRIC, RIGHT,;  Surgeon: Thornton Park, Mcgee;  Location: [facility] ORS;  Service: Orthopedics;  Laterality: N/A;  . LUMBAR LAMINECTOMY/DECOMPRESSION  MICRODISCECTOMY Bilateral 12/14/2012   Procedure: Bilateral lumbar three-four, four-five decompressive laminotomy/foraminotomy;  Surgeon: Charlie Pitter, Mcgee;  Location: [place] [facility] ORS;  Service: Neurosurgery;  Laterality: Bilateral;  . PULSE GENERATOR IMPLANT Bilateral 12/13/2013   Procedure: Bilateral implantable pulse generator placement;  Surgeon: Erline Levine, Mcgee;  Location: [place] [facility] OR[facility];  Service: Neurosurgery;  Laterality: Bilateral;  Bilateral implantable pulse generator placement  . skin cancer removed     from ears,   12 lft arm  rt leg 15  . SUBTHALAMIC STIMULATOR BATTERY REPLACEMENT Bilateral 07/14/2017   Procedure: BILATERAL IMPLANTED PULSE GENERATOR CHANGE FOR DEEP BRAIN STIMULATOR;  Surgeon: Erline Levine, Mcgee;  Location: [place];  Service: Neurosurgery;  Laterality: Bilateral;  . SUBTHALAMIC STIMULATOR INSERTION Bilateral 12/06/2013   Procedure: SUBTHALAMIC STIMULATOR INSERTION;  Surgeon: Erline Levine, Mcgee;  Location: [place] [facility] OR[facility];  Service: Neurosurgery;  Laterality: Bilateral;  Bilateral deep brain stimulator placement    There were no vitals filed for this visit.   Subjective Assessment - 03/30/20 0941    Subjective Pt doign ok today. Increased pain in right thigh. Dizzy spell 2 nights prior. Pt being started on a neg antiepileptic meds soon for recent seizure type activity.    Patient is accompained by: Family member    Pertinent History [name] is a  69 y.o. male with a hx of HTN, bradycardia, Parkinson's s/p deep brain stimulator 11/2013 , HLD, depression, possible seizure disorder, arthritis, depression/PTSD, dysrhythmia, GERD, sleep apnea. Patient was seen by this therapist in years past. Patient had a fall around Halloween of 2020 and admitted for ORIF on 02/18/19, was discharged to SNF and had [place] around Christmas. Had home health therapy for a month and a half. After that tried to go to Outpatient Therapy for 3 sessions at [facility] and was not pleased due to how busy and  crowded the location was. Fell last week in bathroom, didn't take walker in, falls about 2x month. Is very weak in RLE. Patient has a new lift chair and U drive walking. Has a caregiver 4 days a week. Has additional order for radiculopathy, lumbar region. PMH of lumbar laminectomy/decompression microdiscectomy (L3-4, 4-5)  In 2014.  Lumbar 4-5 cage +rod (2016).    Limitations Walking;Standing;Sitting;House hold activities;Lifting    How long can you sit comfortably? not limited with a backrest, without a back rest 3 minutes    How long can you stand comfortably? 3-5 minutes with holding on.    How long can you walk comfortably? with U step 6 minutes.    Patient Stated Goals pain reduction of R hip. strength of legs, dance with wife. push lawnmower.    Currently in Pain? Yes          Intervention this date:  *Orthostatic vital signs   185/64 mmHg 46bpm, 181/69, 47 bpm (supine, assessed twice)  141/71 mmHg 45 bpm sitting 134/69 49 standing at 0 minutes  137/72 49 standing 3 minutes 49   -RLE LAD 1x30sec  -Rt hip ROM screen Flexion WNL, ER ~75*, IR <20* -ART to Rt quads c knee flexion stretch  -Fulcrum stretch seated (femoral stress fracture) negative   -Standing 5x30sec hands free, eyes closed  -LAQ 1x10 bilat @ 7lb; 1x10 @ 10lb bilat (no pain)  -Seated cable resisted hamstrings knee flexion 2x8 @ 12.5lb 2x44f AMB s assistive device, min guard assist    Portions of session performed in target of motor control training, spatial awareness. Pt purposefully given interventions to create frequent LOB to allow for postural awareness and practice of righting strategies with physical assistance provided only as needed. Entire session performed with gait belt donned and minGuard assist of patient.     PT Short Term Goals - 01/08/20 1024      PT SHORT TERM GOAL #1   Title Pt will perform HEP with family's/aide's supervision, for improved balance, transfers, and gait.    Baseline 5/5: HEP  given 6/28: HEP compliant 8/23: aide helps with HEP    Time 4    Period Weeks    Status Achieved    Target Date 09/19/19             PT Long Term Goals - 02/26/20 0945      PT LONG TERM GOAL #1   Title Patient will increase Berg Balance score by > 6 points (54/56) to demonstrate decreased fall risk during functional activities.    Baseline 10/20: 48/56 11/10: 49/56    Time 8    Period Weeks    Status Partially Met    Target Date 04/01/20      PT LONG TERM GOAL #2   Title Patient will ambulate within his household for ~ 5 minutes without an AD with no episodes of falling to increase independence with household mobility    Baseline  10/20: requires use of rollator to ambulate 11/10: requires AD    Time 8    Period Weeks    Status On-going    Target Date 04/01/20      PT LONG TERM GOAL #4   Title Patient will increase FOTO score to equal to or greater than 31/100  to demonstrate statistically significant improvement in mobility and quality of life.    Baseline 5/5: 12/100 6/28: 38% 8/23: 11.7 9/22: 45% 10/20: 47.9% 11/10: 40.2%    Time 8    Period Weeks    Status Achieved      PT LONG TERM GOAL #5   Title Patient will increase six minute walk test distance to >1000 for progression to community ambulator and improve gait ability    Baseline 6/28: 750 ft with U step rollator 8/23: 775 w rolling walker, increased crouch pattern of ambulation 9/22: deferred due to pain; 10/18: 828f 11/10: 770 ft    Time 8    Period Weeks    Status On-going    Target Date 04/01/20      PT LONG TERM GOAL #6   Title Patient will increase Berg Balance score by > 6 points (42/56) to demonstrate decreased fall risk during functional activities.    Baseline 6/28: 36/56 8/23: 42/56 9/22 44/56 10/20: 48/56    Time 8    Period Weeks    Status Achieved      PT LONG TERM GOAL #7   Title Patient will report a worst pain of 3/10 on VAS in low back to improve tolerance with ADLs and reduced symptoms  with activities.    Baseline 8/9: 9/10 8/23: 7/10 pain 9/22: 7/10 10/20: 5/10 11/10:  one episode of 9/10 when fell  the rest 4-6/10    Time 8    Period Weeks    Status Partially Met    Target Date 04/01/20      PT LONG TERM GOAL #8   Title Patient will reduce modified Oswestry score to <20 as to demonstrate minimal disability with ADLs including improved sleeping tolerance, walking/sitting tolerance etc for better mobility with ADLs.    Baseline 8/9: 64% 8/23: 66% 9/22:44% 10/20: 40% 11/20: 54%    Time 8    Period Weeks    Status Partially Met    Target Date 04/01/20                 Plan - 03/30/20 1006    Clinical Impression Statement Continued with current plan of care as laid out in evaluation and recent prior sessions. Pt remains motivated to advance progress toward goals. Rest breaks provided as needed, Pt does require varying levels of assistance and cuing for completion of exercises for correct form and sometimes due to pain/weakness. Pt closely monitored throughout session for safe vitals response and to maximize patient safety during interventions. Pt continues to demonstrate progress toward goals AEB progression of some interventions this date either in volume or intensity. No change in Right leg pain during or after session.     Personal Factors and Comorbidities Age;Comorbidity 3+;Past/Current Experience;Time since onset of injury/illness/exacerbation;Transportation    Comorbidities athritis, bradycardia, cancer, depression, dysrhythmia, GERD, HTN, Parkinson's, PTSD, SOB, sleep apnea    Examination-Activity Limitations Bathing;Bed Mobility;Caring for Others;Bend;Dressing;Lift;Locomotion Level;Reach Overhead;Sit;Squat;Stairs;Stand;Toileting;Transfers    Examination-Participation Restrictions Church;Cleaning;Community Activity;Interpersonal Relationship;Shop;Volunteer;Yard Work;Other    Stability/Clinical Decision Making Stable/Uncomplicated    Clinical Decision Making High     Rehab Potential Fair    PT Frequency 2x /  week    PT Duration 8 weeks    PT Treatment/Interventions ADLs/Self Care Home Management;Aquatic Therapy;Cryotherapy;Electrical Stimulation;Iontophoresis 53m/ml Dexamethasone;Moist Heat;Ultrasound;Contrast Bath;DME Instruction;Gait training;Stair training;Functional mobility training;Therapeutic activities;Therapeutic exercise;Balance training;Neuromuscular re-education;Patient/family education;Manual techniques;Wheelchair mobility training;Energy conservation;Passive range of motion;Traction;Orthotic Fit/Training;Dry needling;Vestibular;Visual/perceptual remediation/compensation;Taping;Canalith Repostioning    PT Next Visit Plan core and pain relief    PT Home Exercise Plan [email or web address]    Consulted and Agree with Plan of Care Patient           Patient will benefit from skilled therapeutic intervention in order to improve the following deficits and impairments:  Abnormal gait,Decreased balance,Decreased coordination,Decreased mobility,Impaired tone,Postural dysfunction,Decreased strength,Decreased safety awareness,Improper body mechanics,Impaired flexibility,Decreased activity tolerance,Decreased endurance,Decreased knowledge of precautions,Difficulty walking,Pain,Cardiopulmonary status limiting activity,Decreased range of motion,Impaired perceived functional ability  Visit Diagnosis: Muscle weakness (generalized)  Other lack of coordination  Unsteadiness on feet  Repeated falls  Difficulty walking     Problem List Patient Active Problem List   Diagnosis Date Noted  . Vertigo 02/19/2020  . Urine abnormality 02/19/2020  . E. coli bacteremia 02/23/2019  . Dysphagia 02/23/2019  . [place]iration pneumonia ([place] 02/21/2019  . Closed right hip fracture ([place] 02/16/2019  . Rib pain on right side 09/25/2018  . Skin sore 12/01/2017  . Hematoma 10/18/2017  . Fall at home 10/18/2017  . 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 Radicular pain in right  arm 10/09/2016  . GERD (gastroesophageal reflux disease) 07/31/2016  . Trochanteric bursitis 03/03/2016  . Left hand pain 10/30/2015  . Fracture, finger, multiple sites 10/30/2015  . Colon cancer screening 07/23/2015  . Healthcare maintenance 07/23/2015  . Gout 02/18/2015  . Depression 01/06/2015  . Irritation of eyelid 08/20/2014  . Dupuytren's contracture 08/20/2014  . Cough 07/21/2014  . Lumbar stenosis with neurogenic claudication 06/13/2014  . Preop cardiovascular exam 05/28/2014  . S/P deep brain stimulator placement 05/08/2014  . Joint pain 01/22/2014  . Medicare annual wellness visit, initial 01/19/2014  . Advance care planning 01/19/2014  . Parkinson's disease ([place] 12/13/2013  . PTSD (post-traumatic stress disorder) 06/13/2013  . Erectile dysfunction 06/13/2013  . HLD (hyperlipidemia) 06/13/2013  . Hip pain 06/10/2013  . Pain in joint, shoulder region 06/10/2013  . Right leg swelling 06/03/2013  . Essential hypertension 06/03/2013  . Bradycardia by electrocardiogram 06/03/2013  . Obstructive sleep apnea 03/12/2013  . Spinal stenosis, lumbar region, with neurogenic claudication 12/14/2012   10:32 AM, 03/30/20 AEtta Grandchild PT, DPT Physical Therapist - [phone]    BEtta Grandchild12/13/2021, 10:10 AM  CL[place]MAIN RPark[facility]-FarmingtonSERVICES [address]RAnnetta [place]Al[place] [address]Phone: [phone]  Fax:  [phone] Name: TNOCHOLAS DAMASOMRN: [id number] of Birth: 203/13/1952

## 2020-03-30 NOTE — Therapy (Signed)
[place] MAIN [facility] SERVICES [address] [place], [place], [address] Phone: [phone]   Fax:  [phone]  Occupational Therapy Treatment  Patient Details  Name: [name] MRN: [id number] Date of Birth: [birth year] No data recorded  Encounter Date: 03/30/2020   OT End of Session - 03/30/20 1745    Visit Number 22    Number of Visits 24    Date for OT Re-Evaluation 06/01/20    OT Start Time 1020    OT Stop Time 1100    OT Time Calculation (min) 40 min    Activity Tolerance Patient tolerated treatment well    Behavior During Therapy [facility] for tasks assessed/performed           Past Medical History:  Diagnosis Date  . Arthritis   . Bradycardia   . Cancer [facility]) 2013   skin cancer  . Depression    ptsd  . Dysrhythmia    chronic slow heart rate  . GERD (gastroesophageal reflux disease)   . Headache(784.0)    tension headaches non recent  . History of chicken pox   . History of kidney stones    passed  . Hypertension    treated with HCTZ  . Parkinson's disease ([place])    dx'ed 15 years ago  . PTSD (post-traumatic stress disorder)   . Shortness of breath dyspnea   . Sleep apnea    doesn't use C-pap  . Varicose veins     Past Surgical History:  Procedure Laterality Date  . CHOLECYSTECTOMY N/A 10/22/2014   Procedure: LAPAROSCOPIC CHOLECYSTECTOMY WITH INTRAOPERATIVE CHOLANGIOGRAM;  Surgeon: Dia Crawford III, MD;  Location: [facility] ORS;  Service: General;  Laterality: N/A;  . cyst removed      from lip as a child  . INTRAMEDULLARY (IM) NAIL INTERTROCHANTERIC N/A 02/18/2019   Procedure: INTRAMEDULLARY (IM) NAIL INTERTROCHANTRIC, RIGHT,;  Surgeon: Thornton Park, MD;  Location: [facility] ORS;  Service: Orthopedics;  Laterality: N/A;  . LUMBAR LAMINECTOMY/DECOMPRESSION MICRODISCECTOMY Bilateral 12/14/2012   Procedure: Bilateral lumbar three-four, four-five decompressive laminotomy/foraminotomy;  Surgeon: Charlie Pitter, MD;  Location: [place]  NEURO ORS;  Service: Neurosurgery;  Laterality: Bilateral;  . PULSE GENERATOR IMPLANT Bilateral 12/13/2013   Procedure: Bilateral implantable pulse generator placement;  Surgeon: Erline Levine, MD;  Location: [place] NEURO ORS;  Service: Neurosurgery;  Laterality: Bilateral;  Bilateral implantable pulse generator placement  . skin cancer removed     from ears,   12 lft arm  rt leg 15  . SUBTHALAMIC STIMULATOR BATTERY REPLACEMENT Bilateral 07/14/2017   Procedure: BILATERAL IMPLANTED PULSE GENERATOR CHANGE FOR DEEP BRAIN STIMULATOR;  Surgeon: Erline Levine, MD;  Location: [place];  Service: Neurosurgery;  Laterality: Bilateral;  . SUBTHALAMIC STIMULATOR INSERTION Bilateral 12/06/2013   Procedure: SUBTHALAMIC STIMULATOR INSERTION;  Surgeon: Erline Levine, MD;  Location: [place] NEURO ORS;  Service: Neurosurgery;  Laterality: Bilateral;  Bilateral deep brain stimulator placement    There were no vitals filed for this visit.   Subjective Assessment - 03/30/20 1743    Subjective  Pt. reports that he had stressful family related issues this past weekend.    Patient is accompanied by: Family member    Pertinent History Pt. is a 69 yo male who has a history of Parkinson's Disease. Pt. has a Deep Brain Stimulator in place. Pt. has a history of Low Back Pain with 2 back surgeries in August 2014, and February 2016. Pt. has a history of multiple frequent falls, unsure of how many falls  in the last 6 months.    Currently in Pain? Yes           Self-care:  Pt. worked on Mudlogger. Pt. was able maintain a mature grasp on the pen 100% of the time using a pen grip. Pt. 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  Pt. performed writing legibility tasks for  basic application completion. Pt. Completed the simulated application with deviation below into the line for the first line only. 75% legibility for filling out the simulated application requiring  increased time to complete. Pt. Formulated larger, and more legible words.Pt. continues to make steady progress overall with ADL, and IADL functioning. Pt. has had multiple falls, and requires continued work to improve self dressing, toileting safety, writing, and typing skills.                        OT Education - 03/30/20 1745    Education provided Yes    Person(s) Educated Patient;Spouse    Comprehension Verbalized understanding               OT Long Term Goals - 03/18/20 1355      OT LONG TERM GOAL #1   Title Pt. will improve Bilateral UE strength by 1 mm grade to assist with ADLs, and IADLs    Baseline BUE strength 4/5 overall at eval    Time 12    Period Weeks    Status On-going    Target Date 06/01/20      OT LONG TERM GOAL #2   Title Pt. will improve grip strength by 10# to open jars and containers.    Baseline Pt. conitnues to have difficulty opening jars    Time 12    Period Weeks    Target Date 06/01/20      OT LONG TERM GOAL #3   Title Patient will complete lower body dressing with modified independence.    Baseline min to mod assist at eval; sock aid supervision, Mod A doff R sock, IND doff L    Time 12    Period Weeks    Status Partially Met    Target Date 06/01/20      OT LONG TERM GOAL #4   Title Patient to demonstrate the ability to vacuum flooring at home with use of modified techniques with supervision.    Baseline Pt. continues to hav difficulty    Time 12    Period Weeks    Status On-going    Target Date 06/01/20      OT LONG TERM GOAL #5   Title Patient will demonstrate the ability to retrieve a snack and transport it to the living room with modified independence.    Baseline Pt. conitnues to work on transporting items    Time 12    Period Weeks    Status On-going    Target Date 06/11/20      OT LONG TERM GOAL #6   Title Patient will improve hand function to be able to perform cutting of food with modified  independence.    Baseline Pt. is improving, however continues to have difficulty    Time 12    Period Weeks    Status Partially Met    Target Date 06/01/20      OT LONG TERM GOAL #7   Title Pt. will write a paragraph efficiently with 90% legibility.    Baseline Limited  writing legibility and poor hand endurance for writing more than 1 sentence.    Time 12    Period Weeks    Status On-going    Target Date 06/01/20      OT LONG TERM GOAL #8   Title Pt. will improve typing speed, and accuracy to be able to to type emails efficently    Baseline Continues to be limited    Time 12    Period Weeks    Status On-going    Target Date 06/01/20      OT LONG TERM GOAL  #9   Baseline Pt will transfers in and out of the shower with stand-by assistance    Time 12    Period Weeks    Status On-going    Target Date 06/01/20      OT LONG TERM GOAL  #10   TITLE Pt will don and doff pants with supervision    Baseline min assist to manage pants    Time 12    Period Weeks    Status On-going    Target Date 06/01/20                 Plan - 03/30/20 1746    Clinical Impression Statement Pt. performed writing legibility tasks for  basic application completion. Pt. Completed the simulated application with deviation below into the line for the first line only. 75% legibility for filling out the simulated application requiring increased time to complete. Pt. Formulated larger, and more legible words.Pt. continues to make steady progress overall with ADL, and IADL functioning. Pt. has had multiple falls, and requires continued work to improve self dressing, toileting safety, writing, and typing skills.     OT Occupational Profile and History Detailed Assessment- Review of Records and additional review of physical, cognitive, psychosocial history related to current functional performance    Occupational performance deficits (Please refer to evaluation for details): ADL's;IADL's;Leisure    Body  Structure / Function / Physical Skills ADL;Coordination;Endurance;GMC;UE functional use;Balance;Decreased knowledge of use of DME;IADL;Pain;Dexterity;FMC;Strength;Mobility;ROM    Psychosocial Skills Coping Strategies;Environmental  Adaptations;Habits;Routines and Behaviors    Rehab Potential Good    Clinical Decision Making Limited treatment options, no task modification necessary    Comorbidities Affecting Occupational Performance: Presence of comorbidities impacting occupational performance    Comorbidities impacting occupational performance description: risk for falls, slow to respond and process information at times, safety    Modification or Assistance to Complete Evaluation  No modification of tasks or assist necessary to complete eval    OT Frequency 2x / week    OT Duration 2 weeks    OT Treatment/Interventions Self-care/ADL training;Therapeutic exercise;DME and/or AE instruction;Energy conservation;Neuromuscular education;Patient/family education;Therapeutic activities;Moist Heat;Functional Mobility Training;Cognitive remediation/compensation    Consulted and Agree with Plan of Care Patient           Patient will benefit from skilled therapeutic intervention in order to improve the following deficits and impairments:   Body Structure / Function / Physical Skills: ADL,Coordination,Endurance,GMC,UE functional use,Balance,Decreased knowledge of use of DME,IADL,Pain,Dexterity,FMC,Strength,Mobility,ROM   Psychosocial Skills: Coping Strategies,Environmental  Adaptations,Habits,Routines and Behaviors   Visit Diagnosis: Muscle weakness (generalized)    Problem List Patient Active Problem List   Diagnosis Date Noted  . Vertigo 02/19/2020  . Urine abnormality 02/19/2020  . E. coli bacteremia 02/23/2019  . Dysphagia 02/23/2019  . Aspiration pneumonia (HCC) 02/21/2019  . Closed right hip fracture (HCC) 02/16/2019  . Rib pain on right side 09/25/2018  . Skin sore 12/01/2017  .  Hematoma 10/18/2017  . Fall at home 10/18/2017  . 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 Radicular pain in right arm 10/09/2016  . GERD (gastroesophageal reflux disease) 07/31/2016  . Trochanteric bursitis 03/03/2016  . Left hand pain 10/30/2015  . Fracture, finger, multiple sites 10/30/2015  . Colon cancer screening 07/23/2015  . Healthcare maintenance 07/23/2015  . Gout 02/18/2015  . Depression 01/06/2015  . Irritation of eyelid 08/20/2014  . Dupuytren's contracture 08/20/2014  . Cough 07/21/2014  . Lumbar stenosis with neurogenic claudication 06/13/2014  . Preop cardiovascular exam 05/28/2014  . S/P deep brain stimulator placement 05/08/2014  . Joint pain 01/22/2014  . Medicare annual wellness visit, initial 01/19/2014  . Advance care planning 01/19/2014  . Parkinson's disease ([place]) 12/13/2013  . PTSD (post-traumatic stress disorder) 06/13/2013  . Erectile dysfunction 06/13/2013  . HLD (hyperlipidemia) 06/13/2013  . Hip pain 06/10/2013  . Pain in joint, shoulder region 06/10/2013  . Right leg swelling 06/03/2013  . Essential hypertension 06/03/2013  . Bradycardia by electrocardiogram 06/03/2013  . Obstructive sleep apnea 03/12/2013  . Spinal stenosis, lumbar region, with neurogenic claudication 12/14/2012    Harrel Carina, MS, OTR/L 03/30/2020, 5:47 PM  [place] MAIN [facility] SERVICES [address] [place], [place], [address] Phone: [phone]   Fax:  [phone]  Name: MYKAH SHIN MRN: [id number] Date of Birth: Sep 16, 1950

## 2020-04-01 ENCOUNTER — Other Ambulatory Visit: Payer: Self-pay

## 2020-04-01 ENCOUNTER — Encounter: Payer: Self-pay | Admitting: Occupational Therapy

## 2020-04-01 ENCOUNTER — Ambulatory Visit: Payer: Medicare PPO

## 2020-04-01 ENCOUNTER — Ambulatory Visit: Payer: Medicare PPO | Admitting: Occupational Therapy

## 2020-04-01 DIAGNOSIS — R2681 Unsteadiness on feet: Secondary | ICD-10-CM | POA: Diagnosis not present

## 2020-04-01 DIAGNOSIS — M6281 Muscle weakness (generalized): Secondary | ICD-10-CM | POA: Diagnosis not present

## 2020-04-01 DIAGNOSIS — R293 Abnormal posture: Secondary | ICD-10-CM

## 2020-04-01 DIAGNOSIS — M545 Low back pain, unspecified: Secondary | ICD-10-CM | POA: Diagnosis not present

## 2020-04-01 DIAGNOSIS — M25551 Pain in right hip: Secondary | ICD-10-CM | POA: Diagnosis not present

## 2020-04-01 DIAGNOSIS — G8929 Other chronic pain: Secondary | ICD-10-CM

## 2020-04-01 DIAGNOSIS — R2689 Other abnormalities of gait and mobility: Secondary | ICD-10-CM

## 2020-04-01 DIAGNOSIS — R278 Other lack of coordination: Secondary | ICD-10-CM

## 2020-04-01 DIAGNOSIS — G20 Parkinson's disease: Secondary | ICD-10-CM

## 2020-04-01 DIAGNOSIS — R262 Difficulty in walking, not elsewhere classified: Secondary | ICD-10-CM

## 2020-04-01 DIAGNOSIS — R296 Repeated falls: Secondary | ICD-10-CM

## 2020-04-01 NOTE — Therapy (Signed)
[place] °[facility] °[address] °[place], [place], [address] °Phone: [phone]   Fax:  [phone] ° °Physical Therapy Treatment ° °Patient Details  °Name: [name] °MRN: [id number] °Date of Birth: 01/09/1951 °Referring Provider (PT): Mantri, Sneha MD  ° ° °Encounter Date: 04/01/2020 ° ° PT End of Session - 04/01/20 0939   ° Visit Number 39   ° Number of Visits 52   ° Date for PT Re-Evaluation 04/01/20   ° Authorization Time Period FOTO- PT   ° PT Start Time 0930   ° PT Stop Time 1010   ° PT Time Calculation (min) 40 min   ° Equipment Utilized During Treatment Gait belt   ° Activity Tolerance Patient tolerated treatment well;No increased pain;Patient limited by fatigue   ° Behavior During Therapy WFL for tasks assessed/performed   °  °  °  ° ° °Past Medical History:  °Diagnosis Date  °• Arthritis   °• Bradycardia   °• Cancer (HCC) 2013  ° skin cancer  °• Depression   ° ptsd  °• Dysrhythmia   ° chronic slow heart rate  °• GERD (gastroesophageal reflux disease)   °• Headache(784.0)   ° tension headaches non recent  °• History of chicken pox   °• History of kidney stones   ° passed  °• Hypertension   ° treated with HCTZ  °• Parkinson's disease (HCC)   ° dx'ed 15 years ago  °• PTSD (post-traumatic stress disorder)   °• Shortness of breath dyspnea   °• Sleep apnea   ° doesn't use C-pap  °• Varicose veins   ° ° °Past Surgical History:  °Procedure Laterality Date  °• CHOLECYSTECTOMY N/A 10/22/2014  ° Procedure: LAPAROSCOPIC CHOLECYSTECTOMY WITH INTRAOPERATIVE CHOLANGIOGRAM;  Surgeon: Ralph Ely III, MD;  Location: [facility] OR[facility];  Service: General;  Laterality: N/A;  °• cyst removed     ° from lip as a child  °• INTRAMEDULLARY (IM) NAIL INTERTROCHANTERIC N/A 02/18/2019  ° Procedure: INTRAMEDULLARY (IM) NAIL INTERTROCHANTRIC, RIGHT,;  Surgeon: Krasinski, Kevin, MD;  Location: [facility];  Service: Orthopedics;  Laterality: N/A;  °• LUMBAR LAMINECTOMY/DECOMPRESSION  MICRODISCECTOMY Bilateral 12/14/2012  ° Procedure: Bilateral lumbar three-four, four-five decompressive laminotomy/foraminotomy;  Surgeon: Henry A Pool, MD;  Location: [facility];  Service: Neurosurgery;  Laterality: Bilateral;  °• PULSE GENERATOR IMPLANT Bilateral 12/13/2013  ° Procedure: Bilateral implantable pulse generator placement;  Surgeon: Joseph Stern, MD;  Location: [facility];  Service: Neurosurgery;  Laterality: Bilateral;  Bilateral implantable pulse generator placement  °• skin cancer removed    ° from ears,   12 lft arm  rt leg 15  °• SUBTHALAMIC STIMULATOR BATTERY REPLACEMENT Bilateral 07/14/2017  ° Procedure: BILATERAL IMPLANTED PULSE GENERATOR CHANGE FOR DEEP BRAIN STIMULATOR;  Surgeon: Stern, Joseph, MD;  Location: [facility];  Service: Neurosurgery;  Laterality: Bilateral;  °• SUBTHALAMIC STIMULATOR INSERTION Bilateral 12/06/2013  ° Procedure: SUBTHALAMIC STIMULATOR INSERTION;  Surgeon: Joseph Stern, MD;  Location: [facility];  Service: Neurosurgery;  Laterality: Bilateral;  Bilateral deep brain stimulator placement  ° ° °There were no vitals filed for this visit. ° ° Subjective Assessment - 04/01/20 0937   ° Subjective Pt bring snew anti seizur emedication for review, supposed to start tomorrow. No other updates.   ° Pertinent History [name] is a 69 y.o. male with a hx of HTN, bradycardia, Parkinson's s/p deep brain stimulator 11/2013 , HLD, depression, possible seizure disorder, arthritis, depression/PTSD, dysrhythmia, GERD, sleep apnea.   Patient was seen by this therapist in years past. Patient had a fall around Halloween of 2020 and admitted for ORIF on 02/18/19, was discharged to SNF and had [place] around Christmas. Had home health therapy for a month and a half. After that tried to go to Outpatient Therapy for 3 sessions at [facility] and was not pleased due to how busy and crowded the location was. Fell last week in bathroom, didn't take walker in, falls about 2x month. Is very weak in  RLE. Patient has a new lift chair and U drive walking. Has a caregiver 4 days a week. Has additional order for radiculopathy, lumbar region. PMH of lumbar laminectomy/decompression microdiscectomy (L3-4, 4-5)  In 2014.  Lumbar 4-5 cage +rod (2016).    Currently in Pain? Yes    Pain Score 3     Pain Location --   Right thigh             [facility] PT Assessment - 04/01/20 0001      Observation/Other Assessments   Focus on Therapeutic Outcomes (FOTO)  40      Ambulation/Gait   Ambulation Distance (Feet) 350 Feet    Assistive device 4-wheeled walker    Gait Comments toired today, speeddecreasing progressively with distance.      Standardized Balance Assessment   Standardized Balance Assessment Berg Balance Test      Berg Balance Test   Sit to Stand Able to stand without using hands and stabilize independently    Standing Unsupported Able to stand safely 2 minutes    Sitting with Back Unsupported but Feet Supported on Floor or Stool Able to sit safely and securely 2 minutes    Stand to Sit Sits safely with minimal use of hands    Transfers Able to transfer safely, minor use of hands    Standing Unsupported with Eyes Closed Able to stand 10 seconds safely    Standing Unsupported with Feet Together Able to place feet together independently and stand for 1 minute with supervision    From Standing, Reach Forward with Outstretched Arm Can reach confidently >25 cm (10")    From Standing Position, Pick up Object from Floor Able to pick up shoe, needs supervision    From Standing Position, Turn to Look Behind Over each Shoulder Looks behind from both sides and weight shifts well    Turn 360 Degrees Able to turn 360 degrees safely in 4 seconds or less    Standing Unsupported, Alternately Place Feet on Step/Stool Needs assistance to keep from falling or unable to try   3 full LOB while attempting   Standing Unsupported, One Foot in Front Able to take small step independently and hold 30 seconds     Standing on One Leg Able to lift leg independently and hold 5-10 seconds    Total Score 47          INTERVENTION THIS DATE: -AA/ROM RLE (knee/hip) on Octane elliptical x 4 minutes  -Lateral hurdle step overs x10 each side -FWD hurdle step overs x10 each side -BBT -Right lateral glute medius, glute minimus release; 4-5x of release palpated, pt correlates to RLE pain; pain in AMB improved.  -AMB 362f in hall c 4WW   FOTO survey     PT Education - 04/01/20 1016    Education provided Yes    Education Details balanc egoals and strength goals after hip fracture    Person(s) Educated Patient    Methods Explanation    Comprehension  Verbalized understanding            PT Short Term Goals - 01/08/20 1024      PT SHORT TERM GOAL #1   Title Pt will perform HEP with family's/aide's supervision, for improved balance, transfers, and gait.    Baseline 5/5: HEP given 6/28: HEP compliant 8/23: aide helps with HEP    Time 4    Period Weeks    Status Achieved    Target Date 09/19/19             PT Long Term Goals - 04/01/20 0943      PT LONG TERM GOAL #1   Title Patient will increase Berg Balance score by > 6 points (54/56) to demonstrate decreased fall risk during functional activities.    Baseline 10/20: 48/56 11/10: 49/56    Time 8    Period Weeks    Status Partially Met    Target Date 04/01/20      PT LONG TERM GOAL #2   Title Patient will ambulate within his household for ~ 5 minutes LRAD with no episodes of falling to increase independence with household mobility    Baseline 10/20: requires use of rollator to ambulate 11/10: requires AD    Time 8    Period Weeks    Status Revised      PT LONG TERM GOAL #4   Title Patient will increase FOTO score to equal to or greater than 31/100  to demonstrate statistically significant improvement in mobility and quality of life.    Baseline 5/5: 12/100 6/28: 38% 8/23: 11.7 9/22: 45% 10/20: 47.9% 11/10: 40.2%; 04/01/20: 40%    Time  8    Period Weeks    Status Achieved    Target Date 02/03/20      PT LONG TERM GOAL #5   Title Patient will increase six minute walk test distance to >1000 for progression to community ambulator and improve gait ability    Baseline 6/28: 750 ft with U step rollator 8/23: 775 w rolling walker, increased crouch pattern of ambulation 9/22: deferred due to pain; 10/18: 812f 11/10: 770 ft    Time 8    Period Weeks    Status On-going    Target Date 04/01/20      PT LONG TERM GOAL #6   Title Patient will increase Berg Balance score by > 6 points (42/56) to demonstrate decreased fall risk during functional activities.    Baseline 6/28: 36/56 8/23: 42/56 9/22 44/56 10/20: 48/56    Time 8    Period Weeks    Status Achieved    Target Date 02/03/20      PT LONG TERM GOAL #7   Title Patient will report a worst pain of 3/10 on VAS in low back to improve tolerance with ADLs and reduced symptoms with activities.    Baseline 8/9: 9/10 8/23: 7/10 pain 9/22: 7/10 10/20: 5/10 11/10:  one episode of 9/10 when fell  the rest 4-6/10    Time 8    Period Weeks    Status Partially Met    Target Date 04/01/20      PT LONG TERM GOAL #8   Title Patient will reduce modified Oswestry score to <20 as to demonstrate minimal disability with ADLs including improved sleeping tolerance, walking/sitting tolerance etc for better mobility with ADLs.    Baseline 8/9: 64% 8/23: 66% 9/22:44% 10/20: 40% 11/20: 54%    Time 8    Period Weeks  Status Partially Met   ° Target Date 04/01/20   °  °  °  ° ° ° ° ° ° ° ° Plan - 04/01/20 0940   ° Clinical Impression Statement summary   ° Personal Factors and Comorbidities Age;Comorbidity 3+;Past/Current Experience;Time since onset of injury/illness/exacerbation;Transportation   ° Comorbidities athritis, bradycardia, cancer, depression, dysrhythmia, GERD, HTN, Parkinson's, PTSD, SOB, sleep apnea   ° Examination-Activity Limitations Bathing;Bed Mobility;Caring for  Others;Bend;Dressing;Lift;Locomotion Level;Reach Overhead;Sit;Squat;Stairs;Stand;Toileting;Transfers   ° Examination-Participation Restrictions Church;Cleaning;Community Activity;Interpersonal Relationship;Shop;Volunteer;Yard Work;Other   ° Stability/Clinical Decision Making Evolving/Moderate complexity   ° Clinical Decision Making High   ° Rehab Potential Fair   ° PT Frequency 2x / week   ° PT Duration 8 weeks   ° PT Treatment/Interventions ADLs/Self Care Home Management;Aquatic Therapy;Cryotherapy;Electrical Stimulation;Iontophoresis 4mg/ml Dexamethasone;Moist Heat;Ultrasound;Contrast Bath;DME Instruction;Gait training;Stair training;Functional mobility training;Therapeutic activities;Therapeutic exercise;Balance training;Neuromuscular re-education;Patient/family education;Manual techniques;Wheelchair mobility training;Energy conservation;Passive range of motion;Traction;Orthotic Fit/Training;Dry needling;Vestibular;Visual/perceptual remediation/compensation;Taping;Canalith Repostioning   ° PT Next Visit Plan Recert next visit; core and pain relief   ° PT Home Exercise Plan [email or web address]   ° Consulted and Agree with Plan of Care Patient   °  °  °  ° ° °Patient will benefit from skilled therapeutic intervention in order to improve the following deficits and impairments:  Abnormal gait,Decreased balance,Decreased coordination,Decreased mobility,Impaired tone,Postural dysfunction,Decreased strength,Decreased safety awareness,Improper body mechanics,Impaired flexibility,Decreased activity tolerance,Decreased endurance,Decreased knowledge of precautions,Difficulty walking,Pain,Cardiopulmonary status limiting activity,Decreased range of motion,Impaired perceived functional ability ° °Visit Diagnosis: °Muscle weakness (generalized) ° °Other lack of coordination ° °Unsteadiness on feet ° °Repeated falls ° °Difficulty walking ° °Chronic low back pain, unspecified back pain laterality, unspecified whether sciatica  present ° °Other abnormalities of gait and mobility ° °Pain in right hip ° °Parkinson's disease (HCC) ° °Abnormal posture ° ° ° ° °Problem List °Patient Active Problem List  ° Diagnosis Date Noted  °• Vertigo 02/19/2020  °• Urine abnormality 02/19/2020  °• E. coli bacteremia 02/23/2019  °• Dysphagia 02/23/2019  °• Aspiration pneumonia (HCC) 02/21/2019  °• Closed right hip fracture (HCC) 02/16/2019  °• Rib pain on right side 09/25/2018  °• Skin sore 12/01/2017  °• Hematoma 10/18/2017  °• Fall at home 10/18/2017  °• REM behavioral disorder 11/02/2016  °• Radicular pain in right arm 10/09/2016  °• GERD (gastroesophageal reflux disease) 07/31/2016  °• Trochanteric bursitis 03/03/2016  °• Left hand pain 10/30/2015  °• Fracture, finger, multiple sites 10/30/2015  °• Colon cancer screening 07/23/2015  °• Healthcare maintenance 07/23/2015  °• Gout 02/18/2015  °• Depression 01/06/2015  °• Irritation of eyelid 08/20/2014  °• Dupuytren's contracture 08/20/2014  °• Cough 07/21/2014  °• Lumbar stenosis with neurogenic claudication 06/13/2014  °• Preop cardiovascular exam 05/28/2014  °• S/P deep brain stimulator placement 05/08/2014  °• Joint pain 01/22/2014  °• Medicare annual wellness visit, initial 01/19/2014  °• Advance care planning 01/19/2014  °• Parkinson's disease (HCC) 12/13/2013  °• PTSD (post-traumatic stress disorder) 06/13/2013  °• Erectile dysfunction 06/13/2013  °• HLD (hyperlipidemia) 06/13/2013  °• Hip pain 06/10/2013  °• Pain in joint, shoulder region 06/10/2013  °• Right leg swelling 06/03/2013  °• Essential hypertension 06/03/2013  °• Bradycardia by electrocardiogram 06/03/2013  °• Obstructive sleep apnea 03/12/2013  °• Spinal stenosis, lumbar region, with neurogenic claudication 12/14/2012  ° °10:20 AM, 04/01/20 ° C , PT, DPT °Physical Therapist - [place] °[facility]  °Outpatient Physical Therapy- Main Campus °[phone]   ° ° °, C °04/01/2020, 10:17  AM ° °[place] °[facility]   [place], [place], [address] Phone: [phone]   Fax:  [phone]  Name: [name] MRN: [id number] Date of Birth: 08-08-1950

## 2020-04-01 NOTE — Therapy (Signed)
[place] MAIN [facility] SERVICES [address] [place], [place], [address] Phone: [phone]   Fax:  [phone]  Occupational Therapy Treatment  Patient Details  Name: [name] MRN: [id number] Date of Birth: 02/14/1951 No data recorded  Encounter Date: 04/01/2020   OT End of Session - 04/01/20 1422    Visit Number 23    Number of Visits 24    Date for OT Re-Evaluation 06/01/20    OT Start Time 1015    OT Stop Time 1100    OT Time Calculation (min) 45 min    Activity Tolerance Patient tolerated treatment well    Behavior During Therapy [facility] for tasks assessed/performed           Past Medical History:  Diagnosis Date  . Arthritis   . Bradycardia   . Cancer [facility]) 2013   skin cancer  . Depression    ptsd  . Dysrhythmia    chronic slow heart rate  . GERD (gastroesophageal reflux disease)   . Headache(784.0)    tension headaches non recent  . History of chicken pox   . History of kidney stones    passed  . Hypertension    treated with HCTZ  . Parkinson's disease ([place])    dx'ed 15 years ago  . PTSD (post-traumatic stress disorder)   . Shortness of breath dyspnea   . Sleep apnea    doesn't use C-pap  . Varicose veins     Past Surgical History:  Procedure Laterality Date  . CHOLECYSTECTOMY N/A 10/22/2014   Procedure: LAPAROSCOPIC CHOLECYSTECTOMY WITH INTRAOPERATIVE CHOLANGIOGRAM;  Surgeon: Dia Crawford III, MD;  Location: [facility] ORS;  Service: General;  Laterality: N/A;  . cyst removed      from lip as a child  . INTRAMEDULLARY (IM) NAIL INTERTROCHANTERIC N/A 02/18/2019   Procedure: INTRAMEDULLARY (IM) NAIL INTERTROCHANTRIC, RIGHT,;  Surgeon: Thornton Park, MD;  Location: [facility] ORS;  Service: Orthopedics;  Laterality: N/A;  . LUMBAR LAMINECTOMY/DECOMPRESSION MICRODISCECTOMY Bilateral 12/14/2012   Procedure: Bilateral lumbar three-four, four-five decompressive laminotomy/foraminotomy;  Surgeon: Charlie Pitter, MD;  Location: [place]  NEURO ORS;  Service: Neurosurgery;  Laterality: Bilateral;  . PULSE GENERATOR IMPLANT Bilateral 12/13/2013   Procedure: Bilateral implantable pulse generator placement;  Surgeon: Erline Levine, MD;  Location: [place] NEURO ORS;  Service: Neurosurgery;  Laterality: Bilateral;  Bilateral implantable pulse generator placement  . skin cancer removed     from ears,   12 lft arm  rt leg 15  . SUBTHALAMIC STIMULATOR BATTERY REPLACEMENT Bilateral 07/14/2017   Procedure: BILATERAL IMPLANTED PULSE GENERATOR CHANGE FOR DEEP BRAIN STIMULATOR;  Surgeon: Erline Levine, MD;  Location: [place];  Service: Neurosurgery;  Laterality: Bilateral;  . SUBTHALAMIC STIMULATOR INSERTION Bilateral 12/06/2013   Procedure: SUBTHALAMIC STIMULATOR INSERTION;  Surgeon: Erline Levine, MD;  Location: [place] NEURO ORS;  Service: Neurosurgery;  Laterality: Bilateral;  Bilateral deep brain stimulator placement    There were no vitals filed for this visit.   Subjective Assessment - 04/01/20 1421    Subjective  Pt. reports that he had stressful family related issues this past weekend.    Patient is accompanied by: Family member    Pertinent History Pt. is a 69 yo male who has a history of Parkinson's Disease. Pt. has a Deep Brain Stimulator in place. Pt. has a history of Low Back Pain with 2 back surgeries in August 2014, and February 2016. Pt. has a history of multiple frequent falls, unsure of how many falls  in the last 6 months.    Patient Stated Goals Pt would like to be more independent daily.          OT TREATMENT    Selfcare:  Pt. worked on [employer] including speed, and accuracy. Pt. completed a 5 min. Typing test with net speed of 1wpm, and 54% accuracy. Pt. completed a 10 min. typing test with net speed of 4 wpm, and 95% accuracy. Pt. worked on copying multiple sentences onto a blank computer screen page. Pt. was able to accurately shift his gaze from the screen to the right of the computer where the sheet of sentences was placed.  Pt. Required increased time to perform the task.  Pt.'s speed, and accuracy improved from the first typing test to the second one. Pt. reports that he is used to using a desktop computer keyboard. The keys on the desktop keyboard are larger, and separated. Pt. continues to work on improving BUE strength, motor control, and [facility] skills in order to work towards improving, and maximizing overall independence with ADLs, and IADL tasks.                          OT Education - 04/01/20 1422    Education provided Yes    Person(s) Educated Patient;Spouse    Methods Explanation    Comprehension Verbalized understanding               OT Long Term Goals - 03/18/20 1355      OT LONG TERM GOAL #1   Title Pt. will improve Bilateral UE strength by 1 mm grade to assist with ADLs, and IADLs    Baseline BUE strength 4/5 overall at eval    Time 12    Period Weeks    Status On-going    Target Date 06/01/20      OT LONG TERM GOAL #2   Title Pt. will improve grip strength by 10# to open jars and containers.    Baseline Pt. conitnues to have difficulty opening jars    Time 12    Period Weeks    Target Date 06/01/20      OT LONG TERM GOAL #3   Title Patient will complete lower body dressing with modified independence.    Baseline min to mod assist at eval; sock aid supervision, Mod A doff R sock, IND doff L    Time 12    Period Weeks    Status Partially Met    Target Date 06/01/20      OT LONG TERM GOAL #4   Title Patient to demonstrate the ability to vacuum flooring at home with use of modified techniques with supervision.    Baseline Pt. continues to hav difficulty    Time 12    Period Weeks    Status On-going    Target Date 06/01/20      OT LONG TERM GOAL #5   Title Patient will demonstrate the ability to retrieve a snack and transport it to the living room with modified independence.    Baseline Pt. conitnues to work on transporting items    Time 12    Period  Weeks    Status On-going    Target Date 06/11/20      OT LONG TERM GOAL #6   Title Patient will improve hand function to be able to perform cutting of food with modified independence.    Baseline Pt. is improving, however continues to  have difficulty    Time 12    Period Weeks    Status Partially Met    Target Date 06/01/20      OT LONG TERM GOAL #7   Title Pt. will write a paragraph efficiently with 90% legibility.    Baseline Limited writing legibility and poor hand endurance for writing more than 1 sentence.    Time 12    Period Weeks    Status On-going    Target Date 06/01/20      OT LONG TERM GOAL #8   Title Pt. will improve typing speed, and accuracy to be able to to type emails efficently    Baseline Continues to be limited    Time 12    Period Weeks    Status On-going    Target Date 06/01/20      OT LONG TERM GOAL  #9   Baseline Pt will transfers in and out of the shower with stand-by assistance    Time 12    Period Weeks    Status On-going    Target Date 06/01/20      OT LONG TERM GOAL  #10   TITLE Pt will don and doff pants with supervision    Baseline min assist to manage pants    Time 12    Period Weeks    Status On-going    Target Date 06/01/20                 Plan - 04/01/20 1423    Clinical Impression Statement Pt.'s speed, and accuracy improved from the first typing test to the second one. Pt. reports that he is used to using a desktop computer keyboard. The keys on the desktop keyboard are larger, and separated. Pt. continues to work on improving BUE strength, motor control, and [facility] skills in order to work towards improving, and maximizing overall independence with ADLs, and IADL tasks.   OT Occupational Profile and History Detailed Assessment- Review of Records and additional review of physical, cognitive, psychosocial history related to current functional performance    Occupational performance deficits (Please refer to evaluation for  details): ADL's;IADL's;Leisure    Body Structure / Function / Physical Skills ADL;Coordination;Endurance;GMC;UE functional use;Balance;Decreased knowledge of use of DME;IADL;Pain;Dexterity;FMC;Strength;Mobility;ROM    Cognitive Skills Attention;Problem Solve;Memory    Psychosocial Skills Coping Strategies;Environmental  Adaptations;Habits;Routines and Behaviors    Rehab Potential Good    Clinical Decision Making Limited treatment options, no task modification necessary    Comorbidities Affecting Occupational Performance: Presence of comorbidities impacting occupational performance    Comorbidities impacting occupational performance description: risk for falls, slow to respond and process information at times, safety    Modification or Assistance to Complete Evaluation  No modification of tasks or assist necessary to complete eval    OT Frequency 2x / week    OT Duration 2 weeks    OT Treatment/Interventions Self-care/ADL training;Therapeutic exercise;DME and/or AE instruction;Energy conservation;Neuromuscular education;Patient/family education;Therapeutic activities;Moist Heat;Functional Mobility Training;Cognitive remediation/compensation    Consulted and Agree with Plan of Care Patient           Patient will benefit from skilled therapeutic intervention in order to improve the following deficits and impairments:   Body Structure / Function / Physical Skills: ADL,Coordination,Endurance,GMC,UE functional use,Balance,Decreased knowledge of use of DME,IADL,Pain,Dexterity,FMC,Strength,Mobility,ROM Cognitive Skills: Attention,Problem Solve,Memory Psychosocial Skills: Coping Strategies,Environmental  Adaptations,Habits,Routines and Behaviors   Visit Diagnosis: Muscle weakness (generalized)    Problem List Patient Active Problem List   Diagnosis Date Noted  .  Vertigo 02/19/2020  . Urine abnormality 02/19/2020  . E. coli bacteremia 02/23/2019  . Dysphagia 02/23/2019  . Aspiration  pneumonia ([place]) 02/21/2019  . Closed right hip fracture ([place]) 02/16/2019  . Rib pain on right side 09/25/2018  . Skin sore 12/01/2017  . Hematoma 10/18/2017  . Fall at home 10/18/2017  . 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 Radicular pain in right arm 10/09/2016  . GERD (gastroesophageal reflux disease) 07/31/2016  . Trochanteric bursitis 03/03/2016  . Left hand pain 10/30/2015  . Fracture, finger, multiple sites 10/30/2015  . Colon cancer screening 07/23/2015  . Healthcare maintenance 07/23/2015  . Gout 02/18/2015  . Depression 01/06/2015  . Irritation of eyelid 08/20/2014  . Dupuytren's contracture 08/20/2014  . Cough 07/21/2014  . Lumbar stenosis with neurogenic claudication 06/13/2014  . Preop cardiovascular exam 05/28/2014  . S/P deep brain stimulator placement 05/08/2014  . Joint pain 01/22/2014  . Medicare annual wellness visit, initial 01/19/2014  . Advance care planning 01/19/2014  . Parkinson's disease ([place]) 12/13/2013  . PTSD (post-traumatic stress disorder) 06/13/2013  . Erectile dysfunction 06/13/2013  . HLD (hyperlipidemia) 06/13/2013  . Hip pain 06/10/2013  . Pain in joint, shoulder region 06/10/2013  . Right leg swelling 06/03/2013  . Essential hypertension 06/03/2013  . Bradycardia by electrocardiogram 06/03/2013  . Obstructive sleep apnea 03/12/2013  . Spinal stenosis, lumbar region, with neurogenic claudication 12/14/2012    Harrel Carina, MS, OTR/L 04/01/2020, 2:29 PM  [place] MAIN [facility] SERVICES [address] [place], [place], [address] Phone: [phone]   Fax:  [phone]  Name: ANTAWN SISON MRN: [id number] Date of Birth: 1951-03-29

## 2020-04-06 ENCOUNTER — Ambulatory Visit: Payer: Medicare PPO

## 2020-04-06 ENCOUNTER — Ambulatory Visit: Payer: Medicare PPO | Admitting: Occupational Therapy

## 2020-04-08 ENCOUNTER — Ambulatory Visit: Payer: Medicare PPO

## 2020-04-08 ENCOUNTER — Ambulatory Visit: Payer: Medicare PPO | Admitting: Occupational Therapy

## 2020-04-08 ENCOUNTER — Encounter: Payer: Self-pay | Admitting: Occupational Therapy

## 2020-04-08 ENCOUNTER — Other Ambulatory Visit: Payer: Self-pay

## 2020-04-08 DIAGNOSIS — R296 Repeated falls: Secondary | ICD-10-CM | POA: Diagnosis not present

## 2020-04-08 DIAGNOSIS — R262 Difficulty in walking, not elsewhere classified: Secondary | ICD-10-CM | POA: Diagnosis not present

## 2020-04-08 DIAGNOSIS — G8929 Other chronic pain: Secondary | ICD-10-CM | POA: Diagnosis not present

## 2020-04-08 DIAGNOSIS — R2689 Other abnormalities of gait and mobility: Secondary | ICD-10-CM | POA: Diagnosis not present

## 2020-04-08 DIAGNOSIS — M545 Low back pain, unspecified: Secondary | ICD-10-CM | POA: Diagnosis not present

## 2020-04-08 DIAGNOSIS — M6281 Muscle weakness (generalized): Secondary | ICD-10-CM

## 2020-04-08 DIAGNOSIS — R2681 Unsteadiness on feet: Secondary | ICD-10-CM | POA: Diagnosis not present

## 2020-04-08 DIAGNOSIS — R278 Other lack of coordination: Secondary | ICD-10-CM

## 2020-04-08 DIAGNOSIS — M25551 Pain in right hip: Secondary | ICD-10-CM | POA: Diagnosis not present

## 2020-04-08 NOTE — Therapy (Signed)
[place] MAIN [facility] SERVICES [address] [place], [place], [address] Phone: [phone]   Fax:  [phone]  Physical Therapy Treatment/RECERT/Physical Therapy Progress Note   Dates of reporting period  02/24/20   to   04/08/20  Patient Details  Name: [name] MRN: [id number] Date of Birth: 1951/04/10 Referring Provider (PT): Sharolyn Douglas MD    Encounter Date: 04/08/2020   PT End of Session - 04/08/20 1006    Visit Number 40    Number of Visits 56    Date for PT Re-Evaluation 06/03/20    Authorization Time Period FOTO- PT    PT Start Time 0929    PT Stop Time 1010    PT Time Calculation (min) 41 min    Equipment Utilized During Treatment Gait belt    Activity Tolerance Patient tolerated treatment well;No increased pain;Patient limited by fatigue    Behavior During Therapy [facility] for tasks assessed/performed           Past Medical History:  Diagnosis Date  . Arthritis   . Bradycardia   . Cancer [facility]) 2013   skin cancer  . Depression    ptsd  . Dysrhythmia    chronic slow heart rate  . GERD (gastroesophageal reflux disease)   . Headache(784.0)    tension headaches non recent  . History of chicken pox   . History of kidney stones    passed  . Hypertension    treated with HCTZ  . Parkinson's disease ([place])    dx'ed 15 years ago  . PTSD (post-traumatic stress disorder)   . Shortness of breath dyspnea   . Sleep apnea    doesn't use C-pap  . Varicose veins     Past Surgical History:  Procedure Laterality Date  . CHOLECYSTECTOMY N/A 10/22/2014   Procedure: LAPAROSCOPIC CHOLECYSTECTOMY WITH INTRAOPERATIVE CHOLANGIOGRAM;  Surgeon: Dia Crawford III, MD;  Location: [facility] ORS;  Service: General;  Laterality: N/A;  . cyst removed      from lip as a child  . INTRAMEDULLARY (IM) NAIL INTERTROCHANTERIC N/A 02/18/2019   Procedure: INTRAMEDULLARY (IM) NAIL INTERTROCHANTRIC, RIGHT,;  Surgeon: Thornton Park, MD;  Location: [facility]  ORS;  Service: Orthopedics;  Laterality: N/A;  . LUMBAR LAMINECTOMY/DECOMPRESSION MICRODISCECTOMY Bilateral 12/14/2012   Procedure: Bilateral lumbar three-four, four-five decompressive laminotomy/foraminotomy;  Surgeon: Charlie Pitter, MD;  Location: [place] NEURO ORS;  Service: Neurosurgery;  Laterality: Bilateral;  . PULSE GENERATOR IMPLANT Bilateral 12/13/2013   Procedure: Bilateral implantable pulse generator placement;  Surgeon: Erline Levine, MD;  Location: [place] NEURO ORS;  Service: Neurosurgery;  Laterality: Bilateral;  Bilateral implantable pulse generator placement  . skin cancer removed     from ears,   12 lft arm  rt leg 15  . SUBTHALAMIC STIMULATOR BATTERY REPLACEMENT Bilateral 07/14/2017   Procedure: BILATERAL IMPLANTED PULSE GENERATOR CHANGE FOR DEEP BRAIN STIMULATOR;  Surgeon: Erline Levine, MD;  Location: [place];  Service: Neurosurgery;  Laterality: Bilateral;  . SUBTHALAMIC STIMULATOR INSERTION Bilateral 12/06/2013   Procedure: SUBTHALAMIC STIMULATOR INSERTION;  Surgeon: Erline Levine, MD;  Location: [place] NEURO ORS;  Service: Neurosurgery;  Laterality: Bilateral;  Bilateral deep brain stimulator placement    There were no vitals filed for this visit.   Subjective Assessment - 04/08/20 1007    Subjective Patient went on anti-seizure meds, family reports increased number of falls since. Reports 4 falls in one week. A lot of his falls are from his reaching for something and overreaches.    Pertinent  History [name] is a 69 y.o. male with a hx of HTN, bradycardia, Parkinson's s/p deep brain stimulator 11/2013 , HLD, depression, possible seizure disorder, arthritis, depression/PTSD, dysrhythmia, GERD, sleep apnea. Patient was seen by this therapist in years past. Patient had a fall around Halloween of 2020 and admitted for ORIF on 02/18/19, was discharged to SNF and had [place] around Christmas. Had home health therapy for a month and a half. After that tried to go to Outpatient Therapy for 3 sessions  at [facility] and was not pleased due to how busy and crowded the location was. Fell last week in bathroom, didn't take walker in, falls about 2x month. Is very weak in RLE. Patient has a new lift chair and U drive walking. Has a caregiver 4 days a week. Has additional order for radiculopathy, lumbar region. PMH of lumbar laminectomy/decompression microdiscectomy (L3-4, 4-5)  In 2014.  Lumbar 4-5 cage +rod (2016).    Limitations Walking;Standing;Sitting;House hold activities;Lifting    How long can you sit comfortably? not limited with a backrest, without a back rest 3 minutes    How long can you stand comfortably? 3-5 minutes with holding on.    How long can you walk comfortably? with U step 6 minutes.    Patient Stated Goals pain reduction of R hip. strength of legs, dance with wife. push lawnmower.    Currently in Pain? Yes    Pain Score 3     Pain Location Hip    Pain Orientation Left    Pain Descriptors / Indicators Aching    Pain Type Chronic pain    Pain Onset More than a month ago    Pain Frequency Intermittent              [facility] PT Assessment - 04/08/20 0001      Standardized Balance Assessment   Standardized Balance Assessment Berg Balance Test      Berg Balance Test   Sit to Stand Able to stand without using hands and stabilize independently    Standing Unsupported Able to stand 2 minutes with supervision    Sitting with Back Unsupported but Feet Supported on Floor or Stool Able to sit safely and securely 2 minutes    Stand to Sit Sits safely with minimal use of hands    Transfers Able to transfer safely, minor use of hands    Standing Unsupported with Eyes Closed Able to stand 10 seconds with supervision    Standing Unsupported with Feet Together Able to place feet together independently and stand for 1 minute with supervision    From Standing, Reach Forward with Outstretched Arm Can reach forward >12 cm safely (5")    From Standing Position, Pick up Object from Floor  Able to pick up shoe, needs supervision    From Standing Position, Turn to Look Behind Over each Shoulder Looks behind from both sides and weight shifts well    Turn 360 Degrees Able to turn 360 degrees safely one side only in 4 seconds or less    Standing Unsupported, Alternately Place Feet on Step/Stool Able to complete 4 steps without aid or supervision    Standing Unsupported, One Foot in Front Able to take small step independently and hold 30 seconds    Standing on One Leg Able to lift leg independently and hold 5-10 seconds    Total Score 45          Patient went on anti-seizure meds, family reports increased number  of falls since. Reports 4 falls in one week. A lot of his falls are from his reaching for something and overreaches.   Goals:   BERG: 45/ 56  Ambulate 5 mins with LRAD FOTO: 15%  6 min walk test : 695 ft with rollator  VAS: 4/5 MODI: 52%    Treatment:  Precor Leg Press: Bilateral LE #110 15x, 2 sets  Single LE leg press: #55, 15x each LE, 2 sets    Patient's condition has the potential to improve in response to therapy. Maximum improvement is yet to be obtained. The anticipated improvement is attainable and reasonable in a generally predictable time.  Patient reports he think therapy is helping but feels the medications are affecting his balance.                    PT Education - 04/08/20 1007    Education provided Yes    Education Details goals, POC, trial period    [employer]) Educated Patient    Methods Explanation;Demonstration;Tactile cues;Verbal cues    Comprehension Verbalized understanding;Returned demonstration;Verbal cues required;Tactile cues required            PT Short Term Goals - 01/08/20 1024      PT SHORT TERM GOAL #1   Title Pt will perform HEP with family's/aide's supervision, for improved balance, transfers, and gait.    Baseline 5/5: HEP given 6/28: HEP compliant 8/23: aide helps with HEP    Time 4    Period Weeks     Status Achieved    Target Date 09/19/19             PT Long Term Goals - 04/08/20 1003      PT LONG TERM GOAL #1   Title Patient will increase Berg Balance score by > 6 points (54/56) to demonstrate decreased fall risk during functional activities.    Baseline 10/20: 48/56 11/10: 49/56 12/22: 45/56    Time 8    Period Weeks    Status On-going    Target Date 06/03/20      PT LONG TERM GOAL #2   Title Patient will ambulate within his household for ~ 5 minutes LRAD with no episodes of falling to increase independence with household mobility    Baseline 10/20: requires use of rollator to ambulate 11/10: requires AD 12/22: deferred due to recent increase in falls    Time 8    Period Weeks    Status Deferred    Target Date 06/03/20      PT LONG TERM GOAL #4   Title Patient will increase FOTO score to equal to or greater than 31/100  to demonstrate statistically significant improvement in mobility and quality of life.    Baseline 5/5: 12/100 6/28: 38% 8/23: 11.7 9/22: 45% 10/20: 47.9% 11/10: 40.2%; 04/01/20: 40%    Time 8    Period Weeks    Status Achieved      PT LONG TERM GOAL #5   Title Patient will increase six minute walk test distance to >1000 for progression to community ambulator and improve gait ability    Baseline 6/28: 750 ft with U step rollator 8/23: 775 w rolling walker, increased crouch pattern of ambulation 9/22: deferred due to pain; 10/18: 827f 11/10: 770 ft 12/22: 695 ft with rolator    Time 8    Period Weeks    Status On-going    Target Date 06/03/20      PT LONG TERM GOAL #6  Title Patient will increase Berg Balance score by > 6 points (42/56) to demonstrate decreased fall risk during functional activities.    Baseline 6/28: 36/56 8/23: 42/56 9/22 44/56 10/20: 48/56    Time 8    Period Weeks    Status Achieved      PT LONG TERM GOAL #7   Title Patient will report a worst pain of 3/10 on VAS in low back to improve tolerance with ADLs and reduced  symptoms with activities.    Baseline 8/9: 9/10 8/23: 7/10 pain 9/22: 7/10 10/20: 5/10 11/10:  one episode of 9/10 when fell  the rest 4-6/10 12/22: 4/10    Time 8    Period Weeks    Status Partially Met    Target Date 06/03/20      PT LONG TERM GOAL #8   Title Patient will reduce modified Oswestry score to <20 as to demonstrate minimal disability with ADLs including improved sleeping tolerance, walking/sitting tolerance etc for better mobility with ADLs.    Baseline 8/9: 64% 8/23: 66% 9/22:44% 10/20: 40% 11/20: 54% 12/22: 52%    Time 8    Period Weeks    Status Partially Met    Target Date 06/03/20                 Plan - 04/08/20 1009    Clinical Impression Statement Patient would benefit from a trial recert due to recent medical issues and changing in medications resulting in higher rate of falling and decreased mobility. Patient is agreeable and understands need for progress to continue therapy. Patient's condition has the potential to improve in response to therapy. Maximum improvement is yet to be obtained. The anticipated improvement is attainable and reasonable in a generally predictable time. Patient would benefit from skilled physical therapy to increase strength, stability, balance, and decrease pain to improve quality of life    Personal Factors and Comorbidities Age;Comorbidity 3+;Past/Current Experience;Time since onset of injury/illness/exacerbation;Transportation    Comorbidities athritis, bradycardia, cancer, depression, dysrhythmia, GERD, HTN, Parkinson's, PTSD, SOB, sleep apnea    Examination-Activity Limitations Bathing;Bed Mobility;Caring for Others;Bend;Dressing;Lift;Locomotion Level;Reach Overhead;Sit;Squat;Stairs;Stand;Toileting;Transfers    Examination-Participation Restrictions Church;Cleaning;Community Activity;Interpersonal Relationship;Shop;Volunteer;Yard Work;Other    Stability/Clinical Decision Making Stable/Uncomplicated    Rehab Potential Fair    PT  Frequency 2x / week    PT Duration 8 weeks    PT Treatment/Interventions ADLs/Self Care Home Management;Aquatic Therapy;Cryotherapy;Electrical Stimulation;Iontophoresis 37m/ml Dexamethasone;Moist Heat;Ultrasound;Contrast Bath;DME Instruction;Gait training;Stair training;Functional mobility training;Therapeutic activities;Therapeutic exercise;Balance training;Neuromuscular re-education;Patient/family education;Manual techniques;Wheelchair mobility training;Energy conservation;Passive range of motion;Traction;Orthotic Fit/Training;Dry needling;Vestibular;Visual/perceptual remediation/compensation;Taping;Canalith Repostioning    PT Next Visit Plan core and pain relief    PT Home Exercise Plan [place] Agree with Plan of Care Patient;Family member/caregiver    Family Member Consulted wife           Patient will benefit from skilled therapeutic intervention in order to improve the following deficits and impairments:  Abnormal gait,Decreased balance,Decreased coordination,Decreased mobility,Impaired tone,Postural dysfunction,Decreased strength,Decreased safety awareness,Improper body mechanics,Impaired flexibility,Decreased activity tolerance,Decreased endurance,Decreased knowledge of precautions,Difficulty walking,Pain,Cardiopulmonary status limiting activity,Decreased range of motion,Impaired perceived functional ability  Visit Diagnosis: Muscle weakness (generalized)  Unsteadiness on feet  Repeated falls  Other abnormalities of gait and mobility     Problem List Patient Active Problem List   Diagnosis Date Noted  . Vertigo 02/19/2020  . Urine abnormality 02/19/2020  . E. coli bacteremia 02/23/2019  . Dysphagia 02/23/2019  . Aspiration pneumonia ([place] 02/21/2019  . Closed right hip fracture ([place] 02/16/2019  .  Rib pain on right side 09/25/2018  . Skin sore 12/01/2017  . Hematoma 10/18/2017  . Fall at home 10/18/2017  . 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  Radicular pain in right arm 10/09/2016  . GERD (gastroesophageal reflux disease) 07/31/2016  . Trochanteric bursitis 03/03/2016  . Left hand pain 10/30/2015  . Fracture, finger, multiple sites 10/30/2015  . Colon cancer screening 07/23/2015  . Healthcare maintenance 07/23/2015  . Gout 02/18/2015  . Depression 01/06/2015  . Irritation of eyelid 08/20/2014  . Dupuytren's contracture 08/20/2014  . Cough 07/21/2014  . Lumbar stenosis with neurogenic claudication 06/13/2014  . Preop cardiovascular exam 05/28/2014  . S/P deep brain stimulator placement 05/08/2014  . Joint pain 01/22/2014  . Medicare annual wellness visit, initial 01/19/2014  . Advance care planning 01/19/2014  . Parkinson's disease ([place]) 12/13/2013  . PTSD (post-traumatic stress disorder) 06/13/2013  . Erectile dysfunction 06/13/2013  . HLD (hyperlipidemia) 06/13/2013  . Hip pain 06/10/2013  . Pain in joint, shoulder region 06/10/2013  . Right leg swelling 06/03/2013  . Essential hypertension 06/03/2013  . Bradycardia by electrocardiogram 06/03/2013  . Obstructive sleep apnea 03/12/2013  . Spinal stenosis, lumbar region, with neurogenic claudication 12/14/2012    Janna Arch 04/08/2020, 10:10 AM  [place] MAIN [facility] SERVICES [place], [place], [address] Phone: [phone]   Fax:  [phone]  Name: [name] MRN: [id number] Date of Birth: 1951/02/17

## 2020-04-08 NOTE — Therapy (Signed)
[place] MAIN [facility] SERVICES [address] [place], [place], [address] Phone: [phone]   Fax:  [phone]  Occupational Therapy Treatment  Patient Details  Name: [name] MRN: [id number] Date of Birth: Jul 09, 1950 No data recorded  Encounter Date: 04/08/2020   OT End of Session - 04/08/20 1025    Visit Number 24    Number of Visits 48    Date for OT Re-Evaluation 06/01/20    Authorization Type reporting period starting 02/05/2020    OT Start Time 1015    OT Stop Time 1100    OT Time Calculation (min) 45 min    Activity Tolerance Patient tolerated treatment well    Behavior During Therapy [facility] for tasks assessed/performed           Past Medical History:  Diagnosis Date  . Arthritis   . Bradycardia   . Cancer [facility]) 2013   skin cancer  . Depression    ptsd  . Dysrhythmia    chronic slow heart rate  . GERD (gastroesophageal reflux disease)   . Headache(784.0)    tension headaches non recent  . History of chicken pox   . History of kidney stones    passed  . Hypertension    treated with HCTZ  . Parkinson's disease ([place])    dx'ed 15 years ago  . PTSD (post-traumatic stress disorder)   . Shortness of breath dyspnea   . Sleep apnea    doesn't use C-pap  . Varicose veins     Past Surgical History:  Procedure Laterality Date  . CHOLECYSTECTOMY N/A 10/22/2014   Procedure: LAPAROSCOPIC CHOLECYSTECTOMY WITH INTRAOPERATIVE CHOLANGIOGRAM;  Surgeon: Dia Crawford III, MD;  Location: [facility] ORS;  Service: General;  Laterality: N/A;  . cyst removed      from lip as a child  . INTRAMEDULLARY (IM) NAIL INTERTROCHANTERIC N/A 02/18/2019   Procedure: INTRAMEDULLARY (IM) NAIL INTERTROCHANTRIC, RIGHT,;  Surgeon: Thornton Park, MD;  Location: [facility] ORS;  Service: Orthopedics;  Laterality: N/A;  . LUMBAR LAMINECTOMY/DECOMPRESSION MICRODISCECTOMY Bilateral 12/14/2012   Procedure: Bilateral lumbar three-four, four-five decompressive  laminotomy/foraminotomy;  Surgeon: Charlie Pitter, MD;  Location: [place] NEURO ORS;  Service: Neurosurgery;  Laterality: Bilateral;  . PULSE GENERATOR IMPLANT Bilateral 12/13/2013   Procedure: Bilateral implantable pulse generator placement;  Surgeon: Erline Levine, MD;  Location: [place] NEURO ORS;  Service: Neurosurgery;  Laterality: Bilateral;  Bilateral implantable pulse generator placement  . skin cancer removed     from ears,   12 lft arm  rt leg 15  . SUBTHALAMIC STIMULATOR BATTERY REPLACEMENT Bilateral 07/14/2017   Procedure: BILATERAL IMPLANTED PULSE GENERATOR CHANGE FOR DEEP BRAIN STIMULATOR;  Surgeon: Erline Levine, MD;  Location: [place];  Service: Neurosurgery;  Laterality: Bilateral;  . SUBTHALAMIC STIMULATOR INSERTION Bilateral 12/06/2013   Procedure: SUBTHALAMIC STIMULATOR INSERTION;  Surgeon: Erline Levine, MD;  Location: [place] NEURO ORS;  Service: Neurosurgery;  Laterality: Bilateral;  Bilateral deep brain stimulator placement    There were no vitals filed for this visit.   Subjective Assessment - 04/08/20 1023    Subjective  Pt. reports that he had stressful family related issues this past weekend.    Patient is accompanied by: Family member    Pertinent History Pt. is a 69 yo male who has a history of Parkinson's Disease. Pt. has a Deep Brain Stimulator in place. Pt. has a history of Low Back Pain with 2 back surgeries in August 2014, and February 2016. Pt. has a history  of multiple frequent falls, unsure of how many falls in the last 6 months.    Patient Stated Goals Pt would like to be more independent daily.    Currently in Pain? Yes    Pain Score 4     Pain Location Back    Pain Descriptors / Indicators Aching    Pain Type Chronic pain          OT TREATMENT    Selfcare:  Pt. worked on Building surveyor using his right hand with a pen grip handle on a standard pen. Pt. worked on Mudlogger while completing multiple sentences on lined paper.  Pt.'s wife was present  during the treatment session. Reviewed pt.'s vision impairments, and discussed compensatory strategies. Pt. was able to write multiple sentences with 50% legibility. Pt. required cues to formulate larger written words. Pt. Was able to complete the sentences with no deviation above, or below the line. Pt. Continues to work on improving hand function skills, strength, and [facility] skills in order to improve ADL, and IADL functioning, typing, writing, and reviewing visual compensatory strategies.                            OT Education - 04/08/20 1024    Education provided Yes    Education Details A/E use, left hand strength    Person(s) Educated Patient;Spouse    Methods Explanation    Comprehension Verbalized understanding               OT Long Term Goals - 03/18/20 1355      OT LONG TERM GOAL #1   Title Pt. will improve Bilateral UE strength by 1 mm grade to assist with ADLs, and IADLs    Baseline BUE strength 4/5 overall at eval    Time 12    Period Weeks    Status On-going    Target Date 06/01/20      OT LONG TERM GOAL #2   Title Pt. will improve grip strength by 10# to open jars and containers.    Baseline Pt. conitnues to have difficulty opening jars    Time 12    Period Weeks    Target Date 06/01/20      OT LONG TERM GOAL #3   Title Patient will complete lower body dressing with modified independence.    Baseline min to mod assist at eval; sock aid supervision, Mod A doff R sock, IND doff L    Time 12    Period Weeks    Status Partially Met    Target Date 06/01/20      OT LONG TERM GOAL #4   Title Patient to demonstrate the ability to vacuum flooring at home with use of modified techniques with supervision.    Baseline Pt. continues to hav difficulty    Time 12    Period Weeks    Status On-going    Target Date 06/01/20      OT LONG TERM GOAL #5   Title Patient will demonstrate the ability to retrieve a snack and transport it to the living room  with modified independence.    Baseline Pt. conitnues to work on transporting items    Time 12    Period Weeks    Status On-going    Target Date 06/11/20      OT LONG TERM GOAL #6   Title Patient will improve hand function to be able to perform  cutting of food with modified independence.    Baseline Pt. is improving, however continues to have difficulty    Time 12    Period Weeks    Status Partially Met    Target Date 06/01/20      OT LONG TERM GOAL #7   Title Pt. will write a paragraph efficiently with 90% legibility.    Baseline Limited writing legibility and poor hand endurance for writing more than 1 sentence.    Time 12    Period Weeks    Status On-going    Target Date 06/01/20      OT LONG TERM GOAL #8   Title Pt. will improve typing speed, and accuracy to be able to to type emails efficently    Baseline Continues to be limited    Time 12    Period Weeks    Status On-going    Target Date 06/01/20      OT LONG TERM GOAL  #9   Baseline Pt will transfers in and out of the shower with stand-by assistance    Time 12    Period Weeks    Status On-going    Target Date 06/01/20      OT LONG TERM GOAL  #10   TITLE Pt will don and doff pants with supervision    Baseline min assist to manage pants    Time 12    Period Weeks    Status On-going    Target Date 06/01/20                 Plan - 04/08/20 1025    Clinical Impression Statement Pt.'s wife was present during the treatment session. Reviewed pt.'s vision impairments, and discussed compensatory strategies. Pt. was able to write multiple sentences with 50% legibility. Pt. required cues to formulate larger written words. Pt. Was able to complete the sentences with no deviation above, or below the line. Pt. Continues to work on improving hand function skills, strength, and [facility] skills in order to improve ADL, and IADL functioning, typing, writing, and reviewing visual compensatory strategies.    OT Occupational  Profile and History Detailed Assessment- Review of Records and additional review of physical, cognitive, psychosocial history related to current functional performance    Occupational performance deficits (Please refer to evaluation for details): ADL's;IADL's;Leisure    Body Structure / Function / Physical Skills ADL;Coordination;Endurance;GMC;UE functional use;Balance;Decreased knowledge of use of DME;IADL;Pain;Dexterity;FMC;Strength;Mobility;ROM    Cognitive Skills Attention;Problem Solve;Memory    Psychosocial Skills Coping Strategies;Environmental  Adaptations;Habits;Routines and Behaviors    Rehab Potential Good    Clinical Decision Making Limited treatment options, no task modification necessary    Comorbidities Affecting Occupational Performance: Presence of comorbidities impacting occupational performance    Comorbidities impacting occupational performance description: risk for falls, slow to respond and process information at times, safety    Modification or Assistance to Complete Evaluation  No modification of tasks or assist necessary to complete eval    OT Frequency 2x / week    OT Duration 2 weeks    OT Treatment/Interventions Self-care/ADL training;Therapeutic exercise;DME and/or AE instruction;Energy conservation;Neuromuscular education;Patient/family education;Therapeutic activities;Moist Heat;Functional Mobility Training;Cognitive remediation/compensation    Consulted and Agree with Plan of Care Patient           Patient will benefit from skilled therapeutic intervention in order to improve the following deficits and impairments:   Body Structure / Function / Physical Skills: ADL,Coordination,Endurance,GMC,UE functional use,Balance,Decreased knowledge of use of DME,IADL,Pain,Dexterity,FMC,Strength,Mobility,ROM Cognitive Skills: Attention,Problem [facility] Psychosocial  Skills: Coping Strategies,Environmental  Adaptations,Habits,Routines and Behaviors   Visit  Diagnosis: Muscle weakness (generalized)  Other lack of coordination    Problem List Patient Active Problem List   Diagnosis Date Noted  . Vertigo 02/19/2020  . Urine abnormality 02/19/2020  . E. coli bacteremia 02/23/2019  . Dysphagia 02/23/2019  . Aspiration pneumonia ([place]) 02/21/2019  . Closed right hip fracture ([place]) 02/16/2019  . Rib pain on right side 09/25/2018  . Skin sore 12/01/2017  . Hematoma 10/18/2017  . Fall at home 10/18/2017  . 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 Radicular pain in right arm 10/09/2016  . GERD (gastroesophageal reflux disease) 07/31/2016  . Trochanteric bursitis 03/03/2016  . Left hand pain 10/30/2015  . Fracture, finger, multiple sites 10/30/2015  . Colon cancer screening 07/23/2015  . Healthcare maintenance 07/23/2015  . Gout 02/18/2015  . Depression 01/06/2015  . Irritation of eyelid 08/20/2014  . Dupuytren's contracture 08/20/2014  . Cough 07/21/2014  . Lumbar stenosis with neurogenic claudication 06/13/2014  . Preop cardiovascular exam 05/28/2014  . S/P deep brain stimulator placement 05/08/2014  . Joint pain 01/22/2014  . Medicare annual wellness visit, initial 01/19/2014  . Advance care planning 01/19/2014  . Parkinson's disease ([place]) 12/13/2013  . PTSD (post-traumatic stress disorder) 06/13/2013  . Erectile dysfunction 06/13/2013  . HLD (hyperlipidemia) 06/13/2013  . Hip pain 06/10/2013  . Pain in joint, shoulder region 06/10/2013  . Right leg swelling 06/03/2013  . Essential hypertension 06/03/2013  . Bradycardia by electrocardiogram 06/03/2013  . Obstructive sleep apnea 03/12/2013  . Spinal stenosis, lumbar region, with neurogenic claudication 12/14/2012    Harrel Carina, MS, OTR/L 04/08/2020, 10:29 AM  [place] MAIN [facility] SERVICES [address] [place], [place], [address] Phone: [phone]   Fax:  [phone]  Name: [name] MRN: [id number] Date of Birth:  December 20, 1950

## 2020-04-10 DIAGNOSIS — J069 Acute upper respiratory infection, unspecified: Secondary | ICD-10-CM | POA: Diagnosis not present

## 2020-04-13 ENCOUNTER — Ambulatory Visit: Payer: Medicare PPO | Admitting: Occupational Therapy

## 2020-04-13 ENCOUNTER — Ambulatory Visit: Payer: Medicare PPO

## 2020-04-13 ENCOUNTER — Ambulatory Visit (INDEPENDENT_AMBULATORY_CARE_PROVIDER_SITE_OTHER): Payer: Medicare PPO | Admitting: Psychology

## 2020-04-13 DIAGNOSIS — F4323 Adjustment disorder with mixed anxiety and depressed mood: Secondary | ICD-10-CM | POA: Diagnosis not present

## 2020-04-13 DIAGNOSIS — F331 Major depressive disorder, recurrent, moderate: Secondary | ICD-10-CM | POA: Diagnosis not present

## 2020-04-15 ENCOUNTER — Ambulatory Visit: Payer: Medicare PPO

## 2020-04-15 ENCOUNTER — Ambulatory Visit: Payer: Medicare PPO | Admitting: Occupational Therapy

## 2020-04-15 ENCOUNTER — Encounter: Payer: Medicare PPO | Admitting: Occupational Therapy

## 2020-04-20 ENCOUNTER — Ambulatory Visit: Payer: Medicare PPO | Admitting: Occupational Therapy

## 2020-04-20 ENCOUNTER — Ambulatory Visit: Payer: Medicare PPO

## 2020-04-22 ENCOUNTER — Ambulatory Visit: Payer: Medicare PPO

## 2020-04-22 ENCOUNTER — Ambulatory Visit: Payer: Medicare PPO | Admitting: Occupational Therapy

## 2020-04-27 ENCOUNTER — Other Ambulatory Visit: Payer: Self-pay

## 2020-04-27 ENCOUNTER — Ambulatory Visit: Payer: Medicare PPO | Admitting: Occupational Therapy

## 2020-04-27 ENCOUNTER — Ambulatory Visit: Payer: Medicare PPO | Attending: Neurology

## 2020-04-27 ENCOUNTER — Ambulatory Visit: Payer: Medicare PPO | Admitting: Psychology

## 2020-04-27 DIAGNOSIS — M6281 Muscle weakness (generalized): Secondary | ICD-10-CM | POA: Diagnosis not present

## 2020-04-27 DIAGNOSIS — R278 Other lack of coordination: Secondary | ICD-10-CM | POA: Diagnosis not present

## 2020-04-27 DIAGNOSIS — G20 Parkinson's disease: Secondary | ICD-10-CM | POA: Diagnosis not present

## 2020-04-27 DIAGNOSIS — R2681 Unsteadiness on feet: Secondary | ICD-10-CM | POA: Diagnosis not present

## 2020-04-27 DIAGNOSIS — R2689 Other abnormalities of gait and mobility: Secondary | ICD-10-CM | POA: Insufficient documentation

## 2020-04-27 DIAGNOSIS — R296 Repeated falls: Secondary | ICD-10-CM | POA: Insufficient documentation

## 2020-04-27 NOTE — Therapy (Signed)
[facility] MAIN [facility] SERVICES [address] [place], [place], [address] Phone: [phone]   Fax:  [phone]  Physical Therapy Treatment  Patient Details  Name: [name] MRN: [id number] Date of Birth: [birth year] Referring Provider (PT): Si Gaul MD    Encounter Date: 04/27/2020   PT End of Session - 04/27/20 1258    Visit Number 41    Number of Visits 56    Date for PT Re-Evaluation 06/03/20    Authorization Time Period FOTO- PT    PT Start Time 0930    PT Stop Time 1011    PT Time Calculation (min) 41 min    Equipment Utilized During Treatment Gait belt    Activity Tolerance Patient tolerated treatment well;No increased pain;Patient limited by fatigue    Behavior During Therapy [name] for tasks assessed/performed           Past Medical History:  Diagnosis Date  . Arthritis   . Bradycardia   . Cancer [facility]) 2013   skin cancer  . Depression    ptsd  . Dysrhythmia    chronic slow heart rate  . GERD (gastroesophageal reflux disease)   . Headache(784.0)    tension headaches non recent  . History of chicken pox   . History of kidney stones    passed  . Hypertension    treated with HCTZ  . Parkinson's disease (HCC)    dx'ed 15 years ago  . PTSD (post-traumatic stress disorder)   . Shortness of breath dyspnea   . Sleep apnea    doesn't use C-pap  . Varicose veins     Past Surgical History:  Procedure Laterality Date  . CHOLECYSTECTOMY N/A 10/22/2014   Procedure: LAPAROSCOPIC CHOLECYSTECTOMY WITH INTRAOPERATIVE CHOLANGIOGRAM;  Surgeon: Tiney Rouge III, MD;  Location: [facility] ORS;  Service: General;  Laterality: N/A;  . cyst removed      from lip as a child  . INTRAMEDULLARY (IM) NAIL INTERTROCHANTERIC N/A 02/18/2019   Procedure: INTRAMEDULLARY (IM) NAIL INTERTROCHANTRIC, RIGHT,;  Surgeon: Juanell Fairly, MD;  Location: [facility] ORS;  Service: Orthopedics;  Laterality: N/A;  . LUMBAR LAMINECTOMY/DECOMPRESSION MICRODISCECTOMY  Bilateral 12/14/2012   Procedure: Bilateral lumbar three-four, four-five decompressive laminotomy/foraminotomy;  Surgeon: Temple Pacini, MD;  Location: [facility];  Service: Neurosurgery;  Laterality: Bilateral;  . PULSE GENERATOR IMPLANT Bilateral 12/13/2013   Procedure: Bilateral implantable pulse generator placement;  Surgeon: Maeola Harman, MD;  Location: [facility] OR[facility];  Service: Neurosurgery;  Laterality: Bilateral;  Bilateral implantable pulse generator placement  . skin cancer removed     from ears,   12 lft arm  rt leg 15  . SUBTHALAMIC STIMULATOR BATTERY REPLACEMENT Bilateral 07/14/2017   Procedure: BILATERAL IMPLANTED PULSE GENERATOR CHANGE FOR DEEP BRAIN STIMULATOR;  Surgeon: Maeola Harman, MD;  Location: [facility] OR;  Service: Neurosurgery;  Laterality: Bilateral;  . SUBTHALAMIC STIMULATOR INSERTION Bilateral 12/06/2013   Procedure: SUBTHALAMIC STIMULATOR INSERTION;  Surgeon: Maeola Harman, MD;  Location: [facility] OR[facility];  Service: Neurosurgery;  Laterality: Bilateral;  Bilateral deep brain stimulator placement    There were no vitals filed for this visit.   Subjective Assessment - 04/27/20 1256    Subjective Patient is returning to physical therpay after absence. He started a new medication for seizures, the more he took per wife the more falls he had. Increased fall rate to at least one fall a day. The doctor took him off the medication, the fall rate is tapering down but patient is  still falling frequently. Patient also was sick with COVID/COVID symptoms after exposure to COVID but is now clear to return. Patient is having pain striaght across his back and down his right leg. Patient did fall straight on his back yesterday. Per wife has not hit his head. Next week goes for an MRI on his brain.    Pertinent History [name] is a 70 y.o. male with a hx of HTN, bradycardia, Parkinson's s/p deep brain stimulator 11/2013 , HLD, depression, possible seizure disorder, arthritis, depression/PTSD,  dysrhythmia, GERD, sleep apnea. Patient was seen by this therapist in years past. Patient had a fall around Halloween of 2020 and admitted for ORIF on 02/18/19, was discharged to SNF and had COVID around Christmas. Had home health therapy for a month and a half. After that tried to go to Outpatient Therapy for 3 sessions at [facility] and was not pleased due to how busy and crowded the location was. Fell last week in bathroom, didn't take walker in, falls about 2x month. Is very weak in RLE. Patient has a new lift chair and U drive walking. Has a caregiver 4 days a week. Has additional order for radiculopathy, lumbar region. PMH of lumbar laminectomy/decompression microdiscectomy (L3-4, 4-5)  In 2014.  Lumbar 4-5 cage +rod (2016).    Limitations Walking;Standing;Sitting;House hold activities;Lifting    How long can you sit comfortably? not limited with a backrest, without a back rest 3 minutes    How long can you stand comfortably? 3-5 minutes with holding on.    How long can you walk comfortably? with U step 6 minutes.    Patient Stated Goals pain reduction of R hip. strength of legs, dance with wife. push lawnmower.    Currently in Pain? Yes    Pain Score 3     Pain Location Back    Pain Orientation Lower    Pain Descriptors / Indicators Aching    Pain Type Chronic pain    Pain Onset More than a month ago    Pain Frequency Intermittent              [facility] PT Assessment - 04/27/20 0001      Standardized Balance Assessment   Standardized Balance Assessment Berg Balance Test      Berg Balance Test   Sit to Stand Able to stand  independently using hands    Standing Unsupported Able to stand 2 minutes with supervision    Sitting with Back Unsupported but Feet Supported on Floor or Stool Able to sit safely and securely 2 minutes    Stand to Sit Controls descent by using hands    Transfers Able to transfer safely, definite need of hands    Standing Unsupported with Eyes Closed Able to stand  10 seconds safely    Standing Unsupported with Feet Together Able to place feet together independently and stand for 1 minute with supervision    From Standing, Reach Forward with Outstretched Arm Can reach forward >12 cm safely (5")    From Standing Position, Pick up Object from Floor Able to pick up shoe, needs supervision    From Standing Position, Turn to Look Behind Over each Shoulder Looks behind from both sides and weight shifts well    Turn 360 Degrees Able to turn 360 degrees safely one side only in 4 seconds or less    Standing Unsupported, Alternately Place Feet on Step/Stool Able to complete >2 steps/needs minimal assist    Standing Unsupported,  One Foot in [place] help to step but can hold 15 seconds    Standing on One Leg Tries to lift leg/unable to hold 3 seconds but remains standing independently    Total Score 39            Patient is returning to physical therpay after absence. He started a new medication for seizures, the more he took per wife the more falls he had. Increased fall rate to at least one fall a day. The doctor took him off the medication, the fall rate is tapering down but patient is still falling frequently. Patient also was sick with COVID/COVID symptoms after exposure to COVID but is now clear to return. Patient is having pain striaght across his back and down his right leg. Patient did fall straight on his back yesterday. Per wife has not hit his head. Next week goes for an MRI on his brain.    BERG: 39/ 56  Ambulate 5 minutes FOTO: 40.3% 6 min walk test: 902 ft; L foot drag with right hip drop multiple near LOB.  VAS: worst: 4-5/10 best: 3/10 MODI: 56%      Access Code: [email or web address] URL: [email or web address] Date: 04/27/2020 Prepared by: Janna Arch  Exercises Supine Bridge - 1 x daily - 7 x weekly - 2 sets - 10 reps - 5 hold Sidelying Clamshell in Neutral - 1 x daily - 7 x weekly - 2 sets - 10 reps - 5 hold Sidelying Reverse  Clamshell - 1 x daily - 7 x weekly - 2 sets - 10 reps - 5 hold    Due to patient's absence from therapy from recent illness and bad reaction to medication, goals were performed to determine a new baseline. Patient was noted to have right hip drop with weightbearing resulting in decreased foot clearance/increased drag of L foot with ambulation. Patient would benefit from skilled physical therapy to increase strength, stability, balance, and decrease pain to improve quality of life               PT Education - 04/27/20 1257    Education provided Yes    Education Details goals, POC, HEP for glute strength    Person(s) Educated Patient;Spouse    Methods Explanation;Demonstration;Tactile cues;Verbal cues;Handout    Comprehension Verbalized understanding;Returned demonstration;Verbal cues required;Tactile cues required            PT Short Term Goals - 01/08/20 1024      PT SHORT TERM GOAL #1   Title Pt will perform HEP with family's/aide's supervision, for improved balance, transfers, and gait.    Baseline 5/5: HEP given 6/28: HEP compliant 8/23: aide helps with HEP    Time 4    Period Weeks    Status Achieved    Target Date 09/19/19             PT Long Term Goals - 04/27/20 1259      PT LONG TERM GOAL #1   Title Patient will increase Berg Balance score by > 6 points (54/56) to demonstrate decreased fall risk during functional activities.    Baseline 10/20: 48/56 11/10: 49/56 12/22: 45/56 1/10: 39/56    Time 8    Period Weeks    Status Partially Met    Target Date 06/03/20      PT LONG TERM GOAL #2   Title Patient will ambulate within his household for ~ 5 minutes LRAD with no episodes of falling to increase independence with household mobility  Baseline 10/20: requires use of rollator to ambulate 11/10: requires AD 12/22: deferred due to recent increase in falls 1/10 deferred due to falls    Time 8    Period Weeks    Status Deferred    Target Date 06/03/20       PT LONG TERM GOAL #4   Title Patient will increase FOTO score to equal to or greater than 31/100  to demonstrate statistically significant improvement in mobility and quality of life.    Baseline 5/5: 12/100 6/28: 38% 8/23: 11.7 9/22: 45% 10/20: 47.9% 11/10: 40.2%; 04/01/20: 40% 1/10: 40.3%    Time 8    Period Weeks    Status Achieved      PT LONG TERM GOAL #5   Title Patient will increase six minute walk test distance to >1000 for progression to community ambulator and improve gait ability    Baseline 6/28: 750 ft with U step rollator 8/23: 775 w rolling walker, increased crouch pattern of ambulation 9/22: deferred due to pain; 10/18: 852ft 11/10: 770 ft 12/22: 695 ft with rolator 1/10: 902 ft; L foot drag    Time 8    Period Weeks    Status Partially Met    Target Date 06/03/20      PT LONG TERM GOAL #6   Title Patient will increase Berg Balance score by > 6 points (42/56) to demonstrate decreased fall risk during functional activities.    Baseline 6/28: 36/56 8/23: 42/56 9/22 44/56 10/20: 48/56    Time 8    Period Weeks    Status Achieved      PT LONG TERM GOAL #7   Title Patient will report a worst pain of 3/10 on VAS in low back to improve tolerance with ADLs and reduced symptoms with activities.    Baseline 8/9: 9/10 8/23: 7/10 pain 9/22: 7/10 10/20: 5/10 11/10:  one episode of 9/10 when fell  the rest 4-6/10 12/22: 4/10    Time 8    Period Weeks    Status Partially Met      PT LONG TERM GOAL #8   Title Patient will reduce modified Oswestry score to <20 as to demonstrate minimal disability with ADLs including improved sleeping tolerance, walking/sitting tolerance etc for better mobility with ADLs.    Baseline 8/9: 64% 8/23: 66% 9/22:44% 10/20: 40% 11/20: 54% 12/22: 52% 1/10:  56%    Time 8    Period Weeks    Status Partially Met    Target Date 06/03/20                 Plan - 04/27/20 1314    Clinical Impression Statement Due to patient's absence from therapy  from recent illness and bad reaction to medication, goals were performed to determine a new baseline. Patient was noted to have right hip drop with weightbearing resulting in decreased foot clearance/increased drag of L foot with ambulation. Patient would benefit from skilled physical therapy to increase strength, stability, balance, and decrease pain to improve quality of life    Personal Factors and Comorbidities Age;Comorbidity 3+;Past/Current Experience;Time since onset of injury/illness/exacerbation;Transportation    Comorbidities athritis, bradycardia, cancer, depression, dysrhythmia, GERD, HTN, Parkinson's, PTSD, SOB, sleep apnea    Examination-Activity Limitations Bathing;Bed Mobility;Caring for Others;Bend;Dressing;Lift;Locomotion Level;Reach Overhead;Sit;Squat;Stairs;Stand;Toileting;Transfers    Examination-Participation Restrictions Church;Cleaning;Community Activity;Interpersonal Relationship;Shop;Volunteer;Yard Work;Other    Stability/Clinical Decision Making Stable/Uncomplicated    Rehab Potential Fair    PT Frequency 2x / week    PT Duration 8 weeks  PT Treatment/Interventions ADLs/Self Care Home Management;Aquatic Therapy;Cryotherapy;Electrical Stimulation;Iontophoresis 4mg /ml Dexamethasone;Moist Heat;Ultrasound;Contrast Bath;DME Instruction;Gait training;Stair training;Functional mobility training;Therapeutic activities;Therapeutic exercise;Balance training;Neuromuscular re-education;Patient/family education;Manual techniques;Wheelchair mobility training;Energy conservation;Passive range of motion;Traction;Orthotic Fit/Training;Dry needling;Vestibular;Visual/perceptual remediation/compensation;Taping;Canalith Repostioning    PT Next Visit Plan core and pain relief    PT Home Exercise Plan [place] and Agree with Plan of Care Patient;Family member/caregiver    Family Member Consulted wife           Patient will benefit from skilled therapeutic intervention in order  to improve the following deficits and impairments:  Abnormal gait,Decreased balance,Decreased coordination,Decreased mobility,Impaired tone,Postural dysfunction,Decreased strength,Decreased safety awareness,Improper body mechanics,Impaired flexibility,Decreased activity tolerance,Decreased endurance,Decreased knowledge of precautions,Difficulty walking,Pain,Cardiopulmonary status limiting activity,Decreased range of motion,Impaired perceived functional ability  Visit Diagnosis: Muscle weakness (generalized)  Other lack of coordination  Unsteadiness on feet  Repeated falls  Other abnormalities of gait and mobility     Problem List Patient Active Problem List   Diagnosis Date Noted  . Vertigo 02/19/2020  . Urine abnormality 02/19/2020  . E. coli bacteremia 02/23/2019  . Dysphagia 02/23/2019  . Aspiration pneumonia ([place]) 02/21/2019  . Closed right hip fracture ([place]) 02/16/2019  . Rib pain on right side 09/25/2018  . Skin sore 12/01/2017  . Hematoma 10/18/2017  . Fall at home 10/18/2017  . 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 Radicular pain in right arm 10/09/2016  . GERD (gastroesophageal reflux disease) 07/31/2016  . Trochanteric bursitis 03/03/2016  . Left hand pain 10/30/2015  . Fracture, finger, multiple sites 10/30/2015  . Colon cancer screening 07/23/2015  . Healthcare maintenance 07/23/2015  . Gout 02/18/2015  . Depression 01/06/2015  . Irritation of eyelid 08/20/2014  . Dupuytren's contracture 08/20/2014  . Cough 07/21/2014  . Lumbar stenosis with neurogenic claudication 06/13/2014  . Preop cardiovascular exam 05/28/2014  . S/P deep brain stimulator placement 05/08/2014  . Joint pain 01/22/2014  . Medicare annual wellness visit, initial 01/19/2014  . Advance care planning 01/19/2014  . Parkinson's disease ([place]) 12/13/2013  . PTSD (post-traumatic stress disorder) 06/13/2013  . Erectile dysfunction 06/13/2013  . HLD (hyperlipidemia) 06/13/2013  . Hip pain  06/10/2013  . Pain in joint, shoulder region 06/10/2013  . Right leg swelling 06/03/2013  . Essential hypertension 06/03/2013  . Bradycardia by electrocardiogram 06/03/2013  . Obstructive sleep apnea 03/12/2013  . Spinal stenosis, lumbar region, with neurogenic claudication 12/14/2012   Janna Arch, PT, DPT   04/27/2020, 1:15 PM  [place] MAIN [facility] SERVICES [address] [place], [place], [address] Phone: [phone]   Fax:  [phone]  Name: LUCERO IDE MRN: [id number] Date of Birth: [birth year]

## 2020-04-29 ENCOUNTER — Encounter: Payer: Self-pay | Admitting: Occupational Therapy

## 2020-04-29 ENCOUNTER — Ambulatory Visit: Payer: Medicare PPO

## 2020-04-29 ENCOUNTER — Ambulatory Visit: Payer: Medicare PPO | Admitting: Occupational Therapy

## 2020-04-29 ENCOUNTER — Other Ambulatory Visit: Payer: Self-pay

## 2020-04-29 DIAGNOSIS — G20 Parkinson's disease: Secondary | ICD-10-CM | POA: Diagnosis not present

## 2020-04-29 DIAGNOSIS — R2681 Unsteadiness on feet: Secondary | ICD-10-CM | POA: Diagnosis not present

## 2020-04-29 DIAGNOSIS — R296 Repeated falls: Secondary | ICD-10-CM | POA: Diagnosis not present

## 2020-04-29 DIAGNOSIS — R278 Other lack of coordination: Secondary | ICD-10-CM

## 2020-04-29 DIAGNOSIS — M6281 Muscle weakness (generalized): Secondary | ICD-10-CM

## 2020-04-29 DIAGNOSIS — R2689 Other abnormalities of gait and mobility: Secondary | ICD-10-CM

## 2020-04-29 NOTE — Therapy (Signed)
[place] MAIN [facility] SERVICES [address] [place], [place], [address] Phone: [phone]   Fax:  [phone]  Occupational Therapy Treatment  Patient Details  Name: [name] MRN: [id number] Date of Birth: [birth year] No data recorded  Encounter Date: 04/29/2020   OT End of Session - 04/29/20 0948    Visit Number 25    Number of Visits 48    Date for OT Re-Evaluation 06/01/20    OT Start Time 1015    OT Stop Time 1100    OT Time Calculation (min) 45 min    Activity Tolerance Patient tolerated treatment well    Behavior During Therapy [facility] for tasks assessed/performed           Past Medical History:  Diagnosis Date  . Arthritis   . Bradycardia   . Cancer [facility]) 2013   skin cancer  . Depression    ptsd  . Dysrhythmia    chronic slow heart rate  . GERD (gastroesophageal reflux disease)   . Headache(784.0)    tension headaches non recent  . History of chicken pox   . History of kidney stones    passed  . Hypertension    treated with HCTZ  . Parkinson's disease ([place])    dx'ed 15 years ago  . PTSD (post-traumatic stress disorder)   . Shortness of breath dyspnea   . Sleep apnea    doesn't use C-pap  . Varicose veins     Past Surgical History:  Procedure Laterality Date  . CHOLECYSTECTOMY N/A 10/22/2014   Procedure: LAPAROSCOPIC CHOLECYSTECTOMY WITH INTRAOPERATIVE CHOLANGIOGRAM;  Surgeon: Dia Crawford III, MD;  Location: [facility] ORS;  Service: General;  Laterality: N/A;  . cyst removed      from lip as a child  . INTRAMEDULLARY (IM) NAIL INTERTROCHANTERIC N/A 02/18/2019   Procedure: INTRAMEDULLARY (IM) NAIL INTERTROCHANTRIC, RIGHT,;  Surgeon: Thornton Park, MD;  Location: [facility] ORS;  Service: Orthopedics;  Laterality: N/A;  . LUMBAR LAMINECTOMY/DECOMPRESSION MICRODISCECTOMY Bilateral 12/14/2012   Procedure: Bilateral lumbar three-four, four-five decompressive laminotomy/foraminotomy;  Surgeon: Charlie Pitter, MD;  Location: [place]  NEURO ORS;  Service: Neurosurgery;  Laterality: Bilateral;  . PULSE GENERATOR IMPLANT Bilateral 12/13/2013   Procedure: Bilateral implantable pulse generator placement;  Surgeon: Erline Levine, MD;  Location: [place] NEURO ORS;  Service: Neurosurgery;  Laterality: Bilateral;  Bilateral implantable pulse generator placement  . skin cancer removed     from ears,   12 lft arm  rt leg 15  . SUBTHALAMIC STIMULATOR BATTERY REPLACEMENT Bilateral 07/14/2017   Procedure: BILATERAL IMPLANTED PULSE GENERATOR CHANGE FOR DEEP BRAIN STIMULATOR;  Surgeon: Erline Levine, MD;  Location: [place];  Service: Neurosurgery;  Laterality: Bilateral;  . SUBTHALAMIC STIMULATOR INSERTION Bilateral 12/06/2013   Procedure: SUBTHALAMIC STIMULATOR INSERTION;  Surgeon: Erline Levine, MD;  Location: [place] NEURO ORS;  Service: Neurosurgery;  Laterality: Bilateral;  Bilateral deep brain stimulator placement    There were no vitals filed for this visit.   Subjective Assessment - 04/29/20 0947    Subjective  Pt. reports he is "plum tuckered" by seated exercises. Wife accompanies pt and reports a recent medication caused increased falling, since MD recently took him off medication they have seen improvement.    Patient is accompanied by: Family member    Pertinent History Pt. is a 70 yo male who has a history of Parkinson's Disease. Pt. has a Deep Brain Stimulator in place. Pt. has a history of Low Back Pain with 2 back surgeries  in August 2014, and February 2016. Pt. has a history of multiple frequent falls, unsure of how many falls in the last 6 months.    Limitations Frequent falls, limited motor control, and [place].     Patient Stated Goals Pt would like to be more independent daily.    Currently in Pain? No/denies    Pain Onset More than a month ago    Pain Onset More than a month ago            THEREX Pt completed seated 4# dowel exercises 1 set x 10 reps each(overhead press, chest press)  increasing to 5# for 1 set x 10 reps (chest  press, overhead press, scapular retractions, wrist flex/ext). Pt required VCs t/o for technique and attention to task, no cues needed for rest breaks. Pt reports he is "plum tuckered" after completing ~10 mins of exercises.    THERAPEUTIC ACTIVITIES Pt completed building and pulling apart 1/2 inch pvc pipe for improved grip strength, depth perception, and in hand manipulation of objects. Pt able to hold 2 small 1 inch objects in L hand without dropping and 3 objects with intermittent dropping. Pt then placed green, red, and yellow clips on pipe to improve pinch strength using BUE. Pt demonstrates difficulty with depth perception and accuracy placing clip with RUE fully extended. Pt requires thumb, 2nd and 3rd digit for pinching clips to place on pipe, improving to 2nd digit and thumb only for pinching to remove.              OT Education - 04/29/20 1306    Education provided Yes    Education Details A/E use, left hand strength    Person(s) Educated Patient;Spouse    Methods Explanation    Comprehension Verbalized understanding    Education Details body mechanics, falls prevention    Person(s) Educated Patient;Spouse    Methods Explanation;Demonstration    Comprehension Verbalized understanding;Returned demonstration               OT Long Term Goals - 03/18/20 1355      OT LONG TERM GOAL #1   Title Pt. will improve Bilateral UE strength by 1 mm grade to assist with ADLs, and IADLs    Baseline BUE strength 4/5 overall at eval    Time 12    Period Weeks    Status On-going    Target Date 06/01/20      OT LONG TERM GOAL #2   Title Pt. will improve grip strength by 10# to open jars and containers.    Baseline Pt. conitnues to have difficulty opening jars    Time 12    Period Weeks    Target Date 06/01/20      OT LONG TERM GOAL #3   Title Patient will complete lower body dressing with modified independence.    Baseline min to mod assist at eval; sock aid supervision, Mod  A doff R sock, IND doff L    Time 12    Period Weeks    Status Partially Met    Target Date 06/01/20      OT LONG TERM GOAL #4   Title Patient to demonstrate the ability to vacuum flooring at home with use of modified techniques with supervision.    Baseline Pt. continues to hav difficulty    Time 12    Period Weeks    Status On-going    Target Date 06/01/20      OT LONG TERM GOAL #5  Title Patient will demonstrate the ability to retrieve a snack and transport it to the living room with modified independence.    Baseline Pt. conitnues to work on transporting items    Time 12    Period Weeks    Status On-going    Target Date 06/11/20      OT LONG TERM GOAL #6   Title Patient will improve hand function to be able to perform cutting of food with modified independence.    Baseline Pt. is improving, however continues to have difficulty    Time 12    Period Weeks    Status Partially Met    Target Date 06/01/20      OT LONG TERM GOAL #7   Title Pt. will write a paragraph efficiently with 90% legibility.    Baseline Limited writing legibility and poor hand endurance for writing more than 1 sentence.    Time 12    Period Weeks    Status On-going    Target Date 06/01/20      OT LONG TERM GOAL #8   Title Pt. will improve typing speed, and accuracy to be able to to type emails efficently    Baseline Continues to be limited    Time 12    Period Weeks    Status On-going    Target Date 06/01/20      OT LONG TERM GOAL  #9   Baseline Pt will transfers in and out of the shower with stand-by assistance    Time 12    Period Weeks    Status On-going    Target Date 06/01/20      OT LONG TERM GOAL  #10   TITLE Pt will don and doff pants with supervision    Baseline min assist to manage pants    Time 12    Period Weeks    Status On-going    Target Date 06/01/20                 Plan - 04/29/20 0949    Clinical Impression Statement Pt.'s wife was present during the  treatment session. Pt required cues for attention to task and technique t/o session. Pt continues to work on improving hand function skills, strength, and [facility] skills in order to improve ADL, and IADL functioning, typing, writing, and reviewing visual compensatory strategies.    OT Occupational Profile and History Detailed Assessment- Review of Records and additional review of physical, cognitive, psychosocial history related to current functional performance    Occupational performance deficits (Please refer to evaluation for details): ADL's;IADL's;Leisure    Body Structure / Function / Physical Skills ADL;Coordination;Endurance;GMC;UE functional use;Balance;Decreased knowledge of use of DME;IADL;Pain;Dexterity;FMC;Strength;Mobility;ROM    Cognitive Skills Attention;Problem Solve;Memory    Psychosocial Skills Coping Strategies;Environmental  Adaptations;Habits;Routines and Behaviors    Rehab Potential Good    Clinical Decision Making Limited treatment options, no task modification necessary    Comorbidities Affecting Occupational Performance: Presence of comorbidities impacting occupational performance    Comorbidities impacting occupational performance description: risk for falls, slow to respond and process information at times, safety    Modification or Assistance to Complete Evaluation  No modification of tasks or assist necessary to complete eval    OT Frequency 2x / week    OT Duration 2 weeks    OT Treatment/Interventions Self-care/ADL training;Therapeutic exercise;DME and/or AE instruction;Energy conservation;Neuromuscular education;Patient/family education;Therapeutic activities;Moist Heat;Functional Mobility Training;Cognitive remediation/compensation    Consulted and Agree with Plan of Care Patient  Patient will benefit from skilled therapeutic intervention in order to improve the following deficits and impairments:   Body Structure / Function / Physical Skills:  ADL,Coordination,Endurance,GMC,UE functional use,Balance,Decreased knowledge of use of DME,IADL,Pain,Dexterity,FMC,Strength,Mobility,ROM Cognitive Skills: Attention,Problem Solve,Memory Psychosocial Skills: Coping Strategies,Environmental  Adaptations,Habits,Routines and Behaviors   Visit Diagnosis: Other lack of coordination  Parkinson's disease [facility])    Problem List Patient Active Problem List   Diagnosis Date Noted  . Vertigo 02/19/2020  . Urine abnormality 02/19/2020  . E. coli bacteremia 02/23/2019  . Dysphagia 02/23/2019  . Aspiration pneumonia ([place]) 02/21/2019  . Closed right hip fracture ([place]) 02/16/2019  . Rib pain on right side 09/25/2018  . Skin sore 12/01/2017  . Hematoma 10/18/2017  . Fall at home 10/18/2017  . 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 Radicular pain in right arm 10/09/2016  . GERD (gastroesophageal reflux disease) 07/31/2016  . Trochanteric bursitis 03/03/2016  . Left hand pain 10/30/2015  . Fracture, finger, multiple sites 10/30/2015  . Colon cancer screening 07/23/2015  . Healthcare maintenance 07/23/2015  . Gout 02/18/2015  . Depression 01/06/2015  . Irritation of eyelid 08/20/2014  . Dupuytren's contracture 08/20/2014  . Cough 07/21/2014  . Lumbar stenosis with neurogenic claudication 06/13/2014  . Preop cardiovascular exam 05/28/2014  . S/P deep brain stimulator placement 05/08/2014  . Joint pain 01/22/2014  . Medicare annual wellness visit, initial 01/19/2014  . Advance care planning 01/19/2014  . Parkinson's disease ([place]) 12/13/2013  . PTSD (post-traumatic stress disorder) 06/13/2013  . Erectile dysfunction 06/13/2013  . HLD (hyperlipidemia) 06/13/2013  . Hip pain 06/10/2013  . Pain in joint, shoulder region 06/10/2013  . Right leg swelling 06/03/2013  . Essential hypertension 06/03/2013  . Bradycardia by electrocardiogram 06/03/2013  . Obstructive sleep apnea 03/12/2013  . Spinal stenosis, lumbar region, with neurogenic  claudication 12/14/2012   Dessie Coma, M.S. OTR/L  04/29/20, 1:42 PM  ascom [phone]  [place] MAIN [facility] SERVICES [place], [place], [address] Phone: [phone]   Fax:  [phone]  Name: TERRAL COOKS MRN: [id number] Date of Birth: 22-Sep-1950

## 2020-04-29 NOTE — Therapy (Signed)
[place] MAIN [facility] SERVICES [address] [place], [place], [address] Phone: [phone]   Fax:  [phone]  Physical Therapy Treatment  Patient Details  Name: [name] MRN: [id number] Date of Birth: 15-Sep-1950 Referring Provider (PT): Sharolyn Douglas MD    Encounter Date: 04/29/2020   PT End of Session - 04/29/20 0940    Visit Number 42    Number of Visits 56    Date for PT Re-Evaluation 06/03/20    Authorization Time Period FOTO- PT    PT Start Time 0930    PT Stop Time 1014    PT Time Calculation (min) 44 min    Equipment Utilized During Treatment Gait belt    Activity Tolerance Patient tolerated treatment well;No increased pain;Patient limited by fatigue    Behavior During Therapy [facility] for tasks assessed/performed           Past Medical History:  Diagnosis Date  . Arthritis   . Bradycardia   . Cancer [facility]) 2013   skin cancer  . Depression    ptsd  . Dysrhythmia    chronic slow heart rate  . GERD (gastroesophageal reflux disease)   . Headache(784.0)    tension headaches non recent  . History of chicken pox   . History of kidney stones    passed  . Hypertension    treated with HCTZ  . Parkinson's disease ([place])    dx'ed 15 years ago  . PTSD (post-traumatic stress disorder)   . Shortness of breath dyspnea   . Sleep apnea    doesn't use C-pap  . Varicose veins     Past Surgical History:  Procedure Laterality Date  . CHOLECYSTECTOMY N/A 10/22/2014   Procedure: LAPAROSCOPIC CHOLECYSTECTOMY WITH INTRAOPERATIVE CHOLANGIOGRAM;  Surgeon: Dia Crawford III, MD;  Location: [facility] ORS;  Service: General;  Laterality: N/A;  . cyst removed      from lip as a child  . INTRAMEDULLARY (IM) NAIL INTERTROCHANTERIC N/A 02/18/2019   Procedure: INTRAMEDULLARY (IM) NAIL INTERTROCHANTRIC, RIGHT,;  Surgeon: Thornton Park, MD;  Location: [facility] ORS;  Service: Orthopedics;  Laterality: N/A;  . LUMBAR LAMINECTOMY/DECOMPRESSION MICRODISCECTOMY  Bilateral 12/14/2012   Procedure: Bilateral lumbar three-four, four-five decompressive laminotomy/foraminotomy;  Surgeon: Charlie Pitter, MD;  Location: [place] NEURO ORS;  Service: Neurosurgery;  Laterality: Bilateral;  . PULSE GENERATOR IMPLANT Bilateral 12/13/2013   Procedure: Bilateral implantable pulse generator placement;  Surgeon: Erline Levine, MD;  Location: [place] NEURO ORS;  Service: Neurosurgery;  Laterality: Bilateral;  Bilateral implantable pulse generator placement  . skin cancer removed     from ears,   12 lft arm  rt leg 15  . SUBTHALAMIC STIMULATOR BATTERY REPLACEMENT Bilateral 07/14/2017   Procedure: BILATERAL IMPLANTED PULSE GENERATOR CHANGE FOR DEEP BRAIN STIMULATOR;  Surgeon: Erline Levine, MD;  Location: [place];  Service: Neurosurgery;  Laterality: Bilateral;  . SUBTHALAMIC STIMULATOR INSERTION Bilateral 12/06/2013   Procedure: SUBTHALAMIC STIMULATOR INSERTION;  Surgeon: Erline Levine, MD;  Location: [place] NEURO ORS;  Service: Neurosurgery;  Laterality: Bilateral;  Bilateral deep brain stimulator placement    There were no vitals filed for this visit.   Subjective Assessment - 04/29/20 0939    Subjective Patient had a near fall but was able to catch himself per wife since last visit. No pain reported. Did his HEP.    Pertinent History [name] is a 70 y.o. male with a hx of HTN, bradycardia, Parkinson's s/p deep brain stimulator 11/2013 , HLD, depression, possible seizure  disorder, arthritis, depression/PTSD, dysrhythmia, GERD, sleep apnea. Patient was seen by this therapist in years past. Patient had a fall around Halloween of 2020 and admitted for ORIF on 02/18/19, was discharged to SNF and had [facility] around Christmas. Had home health therapy for a month and a half. After that tried to go to Outpatient Therapy for 3 sessions at [facility] and was not pleased due to how busy and crowded the location was. Fell last week in bathroom, didn't take walker in, falls about 2x month. Is very weak in  RLE. Patient has a new lift chair and U drive walking. Has a caregiver 4 days a week. Has additional order for radiculopathy, lumbar region. PMH of lumbar laminectomy/decompression microdiscectomy (L3-4, 4-5)  In 2014.  Lumbar 4-5 cage +rod (2016).    Limitations Walking;Standing;Sitting;House hold activities;Lifting    How long can you sit comfortably? not limited with a backrest, without a back rest 3 minutes    How long can you stand comfortably? 3-5 minutes with holding on.    How long can you walk comfortably? with U step 6 minutes.    Patient Stated Goals pain reduction of R hip. strength of legs, dance with wife. push lawnmower.    Currently in Pain? No/denies    Pain Onset More than a month ago                TherEx Nustep Lvl 4 RPM> 60 for cardiovascular and musculoskeletal challenge x 4 minutes  6" step lateral step up/down 8x each direction; BUE support   Sit to stand 10x with football throw, additional 5 reps aftewards to promote upright posture  Ambulate 80 ft x 2 trials with focus on widening BOS and foot clearance    Seated: GTB hamstring curl 15x each LE  Neuro Re-ed: Next to support surface: 6" step toe taps BUE support, PT feet between  Standing balloon taps reaching inside/outside BOS for stabilization x3 minutes theraband line step over with UE support 10x each LE  Seated balloon kicks for spatial awareness, sequencing, timing of muscle recruitment and coordination x 4 minutes   Pt educated throughout session about proper posture and technique with exercises. Improved exercise technique, movement at target joints, use of target muscles after min to mod verbal, visual, tactile cues.  Vitals monitored to ensure therapeutic values with rest breaks as needed  Patient is challenged with stabilization in standing with frequent buckling of RLE. Next session will benefit on focused strengthening in gravity eliminated positions. He requires frequent rest breaks due  to recent illness resulting in increased fatigue. Patient would benefit from skilled physical therapy to increase strength, stability, balance, and decrease pain to improve quality of life                    PT Education - 04/29/20 0940    Education provided Yes    Education Details exercise technique, body mechanics    Person(s) Educated Patient    Methods Explanation;Demonstration;Tactile cues;Verbal cues    Comprehension Verbalized understanding;Returned demonstration;Verbal cues required;Tactile cues required            PT Short Term Goals - 01/08/20 1024      PT SHORT TERM GOAL #1   Title Pt will perform HEP with family's/aide's supervision, for improved balance, transfers, and gait.    Baseline 5/5: HEP given 6/28: HEP compliant 8/23: aide helps with HEP    Time 4    Period Weeks    Status  Achieved    Target Date 09/19/19             PT Long Term Goals - 04/27/20 1259      PT LONG TERM GOAL #1   Title Patient will increase Berg Balance score by > 6 points (54/56) to demonstrate decreased fall risk during functional activities.    Baseline 10/20: 48/56 11/10: 49/56 12/22: 45/56 1/10: 39/56    Time 8    Period Weeks    Status Partially Met    Target Date 06/03/20      PT LONG TERM GOAL #2   Title Patient will ambulate within his household for ~ 5 minutes LRAD with no episodes of falling to increase independence with household mobility    Baseline 10/20: requires use of rollator to ambulate 11/10: requires AD 12/22: deferred due to recent increase in falls 1/10 deferred due to falls    Time 8    Period Weeks    Status Deferred    Target Date 06/03/20      PT LONG TERM GOAL #4   Title Patient will increase FOTO score to equal to or greater than 31/100  to demonstrate statistically significant improvement in mobility and quality of life.    Baseline 5/5: 12/100 6/28: 38% 8/23: 11.7 9/22: 45% 10/20: 47.9% 11/10: 40.2%; 04/01/20: 40% 1/10: 40.3%     Time 8    Period Weeks    Status Achieved      PT LONG TERM GOAL #5   Title Patient will increase six minute walk test distance to >1000 for progression to community ambulator and improve gait ability    Baseline 6/28: 750 ft with U step rollator 8/23: 775 w rolling walker, increased crouch pattern of ambulation 9/22: deferred due to pain; 10/18: 852f 11/10: 770 ft 12/22: 695 ft with rolator 1/10: 902 ft; L foot drag    Time 8    Period Weeks    Status Partially Met    Target Date 06/03/20      PT LONG TERM GOAL #6   Title Patient will increase Berg Balance score by > 6 points (42/56) to demonstrate decreased fall risk during functional activities.    Baseline 6/28: 36/56 8/23: 42/56 9/22 44/56 10/20: 48/56    Time 8    Period Weeks    Status Achieved      PT LONG TERM GOAL #7   Title Patient will report a worst pain of 3/10 on VAS in low back to improve tolerance with ADLs and reduced symptoms with activities.    Baseline 8/9: 9/10 8/23: 7/10 pain 9/22: 7/10 10/20: 5/10 11/10:  one episode of 9/10 when fell  the rest 4-6/10 12/22: 4/10    Time 8    Period Weeks    Status Partially Met      PT LONG TERM GOAL #8   Title Patient will reduce modified Oswestry score to <20 as to demonstrate minimal disability with ADLs including improved sleeping tolerance, walking/sitting tolerance etc for better mobility with ADLs.    Baseline 8/9: 64% 8/23: 66% 9/22:44% 10/20: 40% 11/20: 54% 12/22: 52% 1/10:  56%    Time 8    Period Weeks    Status Partially Met    Target Date 06/03/20                 Plan - 04/29/20 1528    Clinical Impression Statement Patient is challenged with stabilization in standing with frequent buckling of RLE.  Next session will benefit on focused strengthening in gravity eliminated positions. He requires frequent rest breaks due to recent illness resulting in increased fatigue. Patient would benefit from skilled physical therapy to increase strength, stability,  balance, and decrease pain to improve quality of life    Personal Factors and Comorbidities Age;Comorbidity 3+;Past/Current Experience;Time since onset of injury/illness/exacerbation;Transportation    Comorbidities athritis, bradycardia, cancer, depression, dysrhythmia, GERD, HTN, Parkinson's, PTSD, SOB, sleep apnea    Examination-Activity Limitations Bathing;Bed Mobility;Caring for Others;Bend;Dressing;Lift;Locomotion Level;Reach Overhead;Sit;Squat;Stairs;Stand;Toileting;Transfers    Examination-Participation Restrictions Church;Cleaning;Community Activity;Interpersonal Relationship;Shop;Volunteer;Yard Work;Other    Stability/Clinical Decision Making Stable/Uncomplicated    Rehab Potential Fair    PT Frequency 2x / week    PT Duration 8 weeks    PT Treatment/Interventions ADLs/Self Care Home Management;Aquatic Therapy;Cryotherapy;Electrical Stimulation;Iontophoresis 66m/ml Dexamethasone;Moist Heat;Ultrasound;Contrast Bath;DME Instruction;Gait training;Stair training;Functional mobility training;Therapeutic activities;Therapeutic exercise;Balance training;Neuromuscular re-education;Patient/family education;Manual techniques;Wheelchair mobility training;Energy conservation;Passive range of motion;Traction;Orthotic Fit/Training;Dry needling;Vestibular;Visual/perceptual remediation/compensation;Taping;Canalith Repostioning    PT Next Visit Plan core and pain relief    PT Home Exercise Plan [place] Agree with Plan of Care Patient;Family member/caregiver    Family Member Consulted wife           Patient will benefit from skilled therapeutic intervention in order to improve the following deficits and impairments:  Abnormal gait,Decreased balance,Decreased coordination,Decreased mobility,Impaired tone,Postural dysfunction,Decreased strength,Decreased safety awareness,Improper body mechanics,Impaired flexibility,Decreased activity tolerance,Decreased endurance,Decreased knowledge of  precautions,Difficulty walking,Pain,Cardiopulmonary status limiting activity,Decreased range of motion,Impaired perceived functional ability  Visit Diagnosis: Muscle weakness (generalized)  Other lack of coordination  Unsteadiness on feet  Repeated falls  Other abnormalities of gait and mobility     Problem List Patient Active Problem List   Diagnosis Date Noted  . Vertigo 02/19/2020  . Urine abnormality 02/19/2020  . E. coli bacteremia 02/23/2019  . Dysphagia 02/23/2019  . [place]iration pneumonia ([place] 02/21/[place]  . Closed right [place] fracture ([place] 02/16/2019  . Rib pain on right side 09/25/2018  . Skin sore 12/01/2017  . Hematoma 10/18/2017  . Fall at home 10/18/2017  . 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 Radicular pain in right arm 10/09/2016  . GERD (gastroesophageal reflux disease) 07/31/2016  . Trochanteric bursitis 03/03/2016  . Left hand pain 10/30/2015  . Fracture, finger, multiple sites 10/30/2015  . Colon cancer screening 07/23/2015  . Healthcare maintenance 07/23/2015  . Gout 02/18/2015  . Depression 01/06/2015  . Irritation of eyelid 08/20/2014  . Dupuytren's contracture 08/20/2014  . Cough 07/21/2014  . Lumbar stenosis with neurogenic claudication 06/13/2014  . Preop cardiovascular exam 05/28/2014  . S/P deep brain stimulator placement 05/08/2014  . Joint pain 01/22/2014  . Medicare annual wellness visit, initial 01/19/2014  . Advance care planning 01/19/2014  . Parkinson's disease ([place] 12/13/2013  . PTSD (post-traumatic stress disorder) 06/13/2013  . Erectile dysfunction 06/13/2013  . HLD (hyperlipidemia) 06/13/2013  . Hip pain 06/10/2013  . Pain in joint, shoulder region 06/10/2013  . Right leg swelling 06/03/2013  . Essential hypertension 06/03/2013  . Bradycardia by electrocardiogram 06/03/2013  . Obstructive sleep apnea 03/12/2013  . Spinal stenosis, lumbar region, with neurogenic claudication 12/14/2012   MJanna Arch PT,  DPT   04/29/2020, 3:30 PM  [facility] [facility] [address] [address]Phone: [phone]  Fax:  [phone] Name: TSAMBA CUMBAMRN: [id number] of Birth: 2Dec 22, 1952

## 2020-05-01 ENCOUNTER — Ambulatory Visit (INDEPENDENT_AMBULATORY_CARE_PROVIDER_SITE_OTHER): Payer: Medicare PPO | Admitting: Psychology

## 2020-05-01 DIAGNOSIS — F4323 Adjustment disorder with mixed anxiety and depressed mood: Secondary | ICD-10-CM

## 2020-05-01 DIAGNOSIS — F331 Major depressive disorder, recurrent, moderate: Secondary | ICD-10-CM

## 2020-05-02 ENCOUNTER — Ambulatory Visit: Admit: 2020-05-02 | Discharge status: Absent without leave | Payer: Medicare PPO

## 2020-05-04 ENCOUNTER — Ambulatory Visit: Payer: Medicare PPO

## 2020-05-04 ENCOUNTER — Ambulatory Visit: Payer: Medicare PPO | Admitting: Occupational Therapy

## 2020-05-06 ENCOUNTER — Ambulatory Visit: Payer: Medicare PPO | Admitting: Occupational Therapy

## 2020-05-06 ENCOUNTER — Ambulatory Visit: Payer: Medicare PPO

## 2020-05-07 ENCOUNTER — Telehealth: Payer: Self-pay

## 2020-05-07 DIAGNOSIS — R001 Bradycardia, unspecified: Secondary | ICD-10-CM

## 2020-05-07 NOTE — Telephone Encounter (Signed)
Per Nira Conn monitor cannot be mailed until address confirmed.    Awaiting callback from patient.

## 2020-05-07 NOTE — Telephone Encounter (Signed)
Waiting on the patient's wife to call back, to confirm the mailing address for the monitor, then the order can be placed.

## 2020-05-07 NOTE — Telephone Encounter (Signed)
Spoke with wife re : zio.  She prefers to pick up from office and self apply.   Per Nira Conn RN monitor cannot be ordered this way. It will need to be mailed or scheduled in office.   lmov to notify that box will be mailed.

## 2020-05-08 ENCOUNTER — Ambulatory Visit (INDEPENDENT_AMBULATORY_CARE_PROVIDER_SITE_OTHER): Payer: Medicare PPO

## 2020-05-08 DIAGNOSIS — G894 Chronic pain syndrome: Secondary | ICD-10-CM | POA: Diagnosis not present

## 2020-05-08 DIAGNOSIS — R001 Bradycardia, unspecified: Secondary | ICD-10-CM

## 2020-05-08 DIAGNOSIS — M47896 Other spondylosis, lumbar region: Secondary | ICD-10-CM | POA: Diagnosis not present

## 2020-05-08 DIAGNOSIS — M5416 Radiculopathy, lumbar region: Secondary | ICD-10-CM | POA: Diagnosis not present

## 2020-05-08 DIAGNOSIS — Z79899 Other long term (current) drug therapy: Secondary | ICD-10-CM | POA: Diagnosis not present

## 2020-05-08 NOTE — Telephone Encounter (Signed)
Spoke with wife .  Ok to mail zio and address on file verified .

## 2020-05-08 NOTE — Telephone Encounter (Signed)
Left detailed voicemail message that monitor company may call to verify mailing address as well and that number could be from [place] of [place] with instructions to call back if any further questions.

## 2020-05-11 ENCOUNTER — Ambulatory Visit: Payer: Medicare PPO | Admitting: Occupational Therapy

## 2020-05-11 ENCOUNTER — Ambulatory Visit (INDEPENDENT_AMBULATORY_CARE_PROVIDER_SITE_OTHER): Payer: Medicare PPO | Admitting: Psychology

## 2020-05-11 ENCOUNTER — Ambulatory Visit: Payer: Medicare PPO

## 2020-05-11 DIAGNOSIS — F331 Major depressive disorder, recurrent, moderate: Secondary | ICD-10-CM

## 2020-05-11 DIAGNOSIS — F4323 Adjustment disorder with mixed anxiety and depressed mood: Secondary | ICD-10-CM | POA: Diagnosis not present

## 2020-05-13 ENCOUNTER — Ambulatory Visit: Payer: Medicare PPO

## 2020-05-13 ENCOUNTER — Encounter: Payer: Self-pay | Admitting: Occupational Therapy

## 2020-05-13 ENCOUNTER — Ambulatory Visit: Payer: Medicare PPO | Admitting: Occupational Therapy

## 2020-05-13 ENCOUNTER — Other Ambulatory Visit: Payer: Self-pay

## 2020-05-13 DIAGNOSIS — G20 Parkinson's disease: Secondary | ICD-10-CM | POA: Diagnosis not present

## 2020-05-13 DIAGNOSIS — R278 Other lack of coordination: Secondary | ICD-10-CM

## 2020-05-13 DIAGNOSIS — M6281 Muscle weakness (generalized): Secondary | ICD-10-CM

## 2020-05-13 DIAGNOSIS — R2689 Other abnormalities of gait and mobility: Secondary | ICD-10-CM | POA: Diagnosis not present

## 2020-05-13 DIAGNOSIS — R2681 Unsteadiness on feet: Secondary | ICD-10-CM

## 2020-05-13 DIAGNOSIS — R296 Repeated falls: Secondary | ICD-10-CM | POA: Diagnosis not present

## 2020-05-13 DIAGNOSIS — R569 Unspecified convulsions: Secondary | ICD-10-CM | POA: Diagnosis not present

## 2020-05-13 NOTE — Therapy (Signed)
[place] MAIN [facility] SERVICES [address] [place], [place], [address] Phone: [phone]   Fax:  [phone]  Occupational Therapy Treatment  Patient Details  Name: [name] MRN: [id number] Date of Birth: 04-Sep-1950 No data recorded  Encounter Date: 05/13/2020   OT End of Session - 05/13/20 1039    Visit Number 26    Number of Visits 48    Date for OT Re-Evaluation 06/01/20    Authorization Type reporting period starting 02/05/2020    OT Start Time 1018    OT Stop Time 1100    OT Time Calculation (min) 42 min    Activity Tolerance Patient tolerated treatment well    Behavior During Therapy [facility] for tasks assessed/performed           Past Medical History:  Diagnosis Date  . Arthritis   . Bradycardia   . Cancer [facility]) 2013   skin cancer  . Depression    ptsd  . Dysrhythmia    chronic slow heart rate  . GERD (gastroesophageal reflux disease)   . Headache(784.0)    tension headaches non recent  . History of chicken pox   . History of kidney stones    passed  . Hypertension    treated with HCTZ  . Parkinson's disease ([place])    dx'ed 15 years ago  . PTSD (post-traumatic stress disorder)   . Shortness of breath dyspnea   . Sleep apnea    doesn't use C-pap  . Varicose veins     Past Surgical History:  Procedure Laterality Date  . CHOLECYSTECTOMY N/A 10/22/2014   Procedure: LAPAROSCOPIC CHOLECYSTECTOMY WITH INTRAOPERATIVE CHOLANGIOGRAM;  Surgeon: Dia Crawford III, MD;  Location: [facility] ORS;  Service: General;  Laterality: N/A;  . cyst removed      from lip as a child  . INTRAMEDULLARY (IM) NAIL INTERTROCHANTERIC N/A 02/18/2019   Procedure: INTRAMEDULLARY (IM) NAIL INTERTROCHANTRIC, RIGHT,;  Surgeon: Thornton Park, MD;  Location: [facility] ORS;  Service: Orthopedics;  Laterality: N/A;  . LUMBAR LAMINECTOMY/DECOMPRESSION MICRODISCECTOMY Bilateral 12/14/2012   Procedure: Bilateral lumbar three-four, four-five decompressive  laminotomy/foraminotomy;  Surgeon: Charlie Pitter, MD;  Location: [place] NEURO ORS;  Service: Neurosurgery;  Laterality: Bilateral;  . PULSE GENERATOR IMPLANT Bilateral 12/13/2013   Procedure: Bilateral implantable pulse generator placement;  Surgeon: Erline Levine, MD;  Location: [place] NEURO ORS;  Service: Neurosurgery;  Laterality: Bilateral;  Bilateral implantable pulse generator placement  . skin cancer removed     from ears,   12 lft arm  rt leg 15  . SUBTHALAMIC STIMULATOR BATTERY REPLACEMENT Bilateral 07/14/2017   Procedure: BILATERAL IMPLANTED PULSE GENERATOR CHANGE FOR DEEP BRAIN STIMULATOR;  Surgeon: Erline Levine, MD;  Location: [place];  Service: Neurosurgery;  Laterality: Bilateral;  . SUBTHALAMIC STIMULATOR INSERTION Bilateral 12/06/2013   Procedure: SUBTHALAMIC STIMULATOR INSERTION;  Surgeon: Erline Levine, MD;  Location: [place] NEURO ORS;  Service: Neurosurgery;  Laterality: Bilateral;  Bilateral deep brain stimulator placement    There were no vitals filed for this visit.   Subjective Assessment - 05/13/20 1027    Subjective  Pt. reports multiple falls since last visit.    Patient is accompanied by: Family member    Pertinent History Pt. is a 70 yo male who has a history of Parkinson's Disease. Pt. has a Deep Brain Stimulator in place. Pt. has a history of Low Back Pain with 2 back surgeries in August 2014, and February 2016. Pt. has a history of multiple frequent falls, unsure  of how many falls in the last 6 months.    Currently in Pain? Yes    Pain Score 7     Pain Orientation Right    Pain Descriptors / Indicators Burning    Pain Type Acute pain           OT Treatment  Therapeutic Exercise:  Pt. performed2# dowel ex. for UE strengthening secondary to weakness. Bilateral shoulder flexion, chest press,and circular patterns were performed1 set of 10- 20 reps each. Pt. Performed2# dumbbell ex.for elbow flexion, extension, forearm supination/pronation, wrist flexion/extension.Pt.  requires rest breaks and verbal cues, tactile cues, and cues for visual demonstrationfor proper technique.Pt. required increased time to complete.  Self-care:  Pt. education was provided about sectional plates, and plate guards to improve scooping of food onto the utensil.  Pt. reports having had an increase in falls, weakness in the right LE, and increased pain when performing sit to stand from a seated position. Pt. Has an appointment at [facility] for an MRI the afternoon. Pt. Has a follow-up appointment with his physician this week. Pt.requiredincreased time,verbal, and visual cues for proper there. ex. form, and pace during the exercises. Pt. continues to work on improving UE strength, [facility] skill in order to work towards improving, and maximizing independence with ADLs, and IADLs.                      OT Education - 05/13/20 1039    Education provided Yes    Education Details A/E use, left hand strength    Person(s) Educated Patient;Spouse    Methods Explanation    Comprehension Verbalized understanding               OT Long Term Goals - 03/18/20 1355      OT LONG TERM GOAL #1   Title Pt. will improve Bilateral UE strength by 1 mm grade to assist with ADLs, and IADLs    Baseline BUE strength 4/5 overall at eval    Time 12    Period Weeks    Status On-going    Target Date 06/01/20      OT LONG TERM GOAL #2   Title Pt. will improve grip strength by 10# to open jars and containers.    Baseline Pt. conitnues to have difficulty opening jars    Time 12    Period Weeks    Target Date 06/01/20      OT LONG TERM GOAL #3   Title Patient will complete lower body dressing with modified independence.    Baseline min to mod assist at eval; sock aid supervision, Mod A doff R sock, IND doff L    Time 12    Period Weeks    Status Partially Met    Target Date 06/01/20      OT LONG TERM GOAL #4   Title Patient to demonstrate the ability to vacuum flooring at  home with use of modified techniques with supervision.    Baseline Pt. continues to hav difficulty    Time 12    Period Weeks    Status On-going    Target Date 06/01/20      OT LONG TERM GOAL #5   Title Patient will demonstrate the ability to retrieve a snack and transport it to the living room with modified independence.    Baseline Pt. conitnues to work on transporting items    Time 12    Period Weeks    Status On-going  Target Date 06/11/20      OT LONG TERM GOAL #6   Title Patient will improve hand function to be able to perform cutting of food with modified independence.    Baseline Pt. is improving, however continues to have difficulty    Time 12    Period Weeks    Status Partially Met    Target Date 06/01/20      OT LONG TERM GOAL #7   Title Pt. will write a paragraph efficiently with 90% legibility.    Baseline Limited writing legibility and poor hand endurance for writing more than 1 sentence.    Time 12    Period Weeks    Status On-going    Target Date 06/01/20      OT LONG TERM GOAL #8   Title Pt. will improve typing speed, and accuracy to be able to to type emails efficently    Baseline Continues to be limited    Time 12    Period Weeks    Status On-going    Target Date 06/01/20      OT LONG TERM GOAL  #9   Baseline Pt will transfers in and out of the shower with stand-by assistance    Time 12    Period Weeks    Status On-going    Target Date 06/01/20      OT LONG TERM GOAL  #10   TITLE Pt will don and doff pants with supervision    Baseline min assist to manage pants    Time 12    Period Weeks    Status On-going    Target Date 06/01/20                 Plan - 05/13/20 1043    Clinical Impression Statement Pt. reports having had an increase in falls, weakness in the right LE, and increased pain when performing sit to stand from a seated position. Pt. Has an appointment at [facility] for an MRI the afternoon. Pt. Has a follow-up appointment with  his physician this week. Pt.requiredincreased time,verbal, and visual cues for proper there. ex. form, and pace during the exercises. Pt. continues to work on improving UE strength, [id number] skill in order to work towards improving, and maximizing independence with ADLs, and IADLs.   OT Occupational Profile and History Detailed Assessment- Review of Records and additional review of physical, cognitive, psychosocial history related to current functional performance    Occupational performance deficits (Please refer to evaluation for details): ADL's;IADL's;Leisure    Body Structure / Function / Physical Skills ADL;Coordination;Endurance;GMC;UE functional use;Balance;Decreased knowledge of use of DME;IADL;Pain;Dexterity;FMC;Strength;Mobility;ROM    Cognitive Skills Attention;Problem Solve;Memory    Psychosocial Skills Coping Strategies;Environmental  Adaptations;Habits;Routines and Behaviors    Rehab Potential Good    Clinical Decision Making Limited treatment options, no task modification necessary    Comorbidities Affecting Occupational Performance: Presence of comorbidities impacting occupational performance    Modification or Assistance to Complete Evaluation  No modification of tasks or assist necessary to complete eval    OT Frequency 2x / week    OT Duration 2 weeks    OT Treatment/Interventions Self-care/ADL training;Therapeutic exercise;DME and/or AE instruction;Energy conservation;Neuromuscular education;Patient/family education;Therapeutic activities;Moist Heat;Functional Mobility Training;Cognitive remediation/compensation    Consulted and Agree with Plan of Care Patient           Patient will benefit from skilled therapeutic intervention in order to improve the following deficits and impairments:   Body Structure / Function / Physical Skills: ADL,Coordination,Endurance,GMC,UE  functional use,Balance,Decreased knowledge of use of  DME,IADL,Pain,Dexterity,FMC,Strength,Mobility,ROM Cognitive Skills: Attention,Problem Solve,Memory Psychosocial Skills: Coping Strategies,Environmental  Adaptations,Habits,Routines and Behaviors   Visit Diagnosis: Muscle weakness (generalized)  Other lack of coordination    Problem List Patient Active Problem List   Diagnosis Date Noted  . Vertigo 02/19/2020  . Urine abnormality 02/19/2020  . E. coli bacteremia 02/23/2019  . Dysphagia 02/23/2019  . Aspiration pneumonia ([place]) 02/21/2019  . Closed right hip fracture ([place]) 02/16/2019  . Rib pain on right side 09/25/2018  . Skin sore 12/01/2017  . Hematoma 10/18/2017  . Fall at home 10/18/2017  . 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 Radicular pain in right arm 10/09/2016  . GERD (gastroesophageal reflux disease) 07/31/2016  . Trochanteric bursitis 03/03/2016  . Left hand pain 10/30/2015  . Fracture, finger, multiple sites 10/30/2015  . Colon cancer screening 07/23/2015  . Healthcare maintenance 07/23/2015  . Gout 02/18/2015  . Depression 01/06/2015  . Irritation of eyelid 08/20/2014  . Dupuytren's contracture 08/20/2014  . Cough 07/21/2014  . Lumbar stenosis with neurogenic claudication 06/13/2014  . Preop cardiovascular exam 05/28/2014  . S/P deep brain stimulator placement 05/08/2014  . Joint pain 01/22/2014  . Medicare annual wellness visit, initial 01/19/2014  . Advance care planning 01/19/2014  . Parkinson's disease ([place]) 12/13/2013  . PTSD (post-traumatic stress disorder) 06/13/2013  . Erectile dysfunction 06/13/2013  . HLD (hyperlipidemia) 06/13/2013  . Hip pain 06/10/2013  . Pain in joint, shoulder region 06/10/2013  . Right leg swelling 06/03/2013  . Essential hypertension 06/03/2013  . Bradycardia by electrocardiogram 06/03/2013  . Obstructive sleep apnea 03/12/2013  . Spinal stenosis, lumbar region, with neurogenic claudication 12/14/2012    Harrel Carina, MS, OTR/L 05/13/2020, 11:52 AM  [place] MAIN [facility] SERVICES [address] [place], [place], [address] Phone: [phone]   Fax:  [phone]  Name: LASHUN MCCANTS MRN: [id number] Date of Birth: 1950-09-03

## 2020-05-13 NOTE — Therapy (Signed)
[place] MAIN [facility] SERVICES [address] [place], [place], [address] Phone: [phone]   Fax:  [phone]  Physical Therapy Treatment  Patient Details  Name: [name] MRN: [id number] Date of Birth: June 05, 1950 Referring Provider (PT): Sharolyn Douglas MD    Encounter Date: 05/13/2020   PT End of Session - 05/13/20 1658    Visit Number 43    Number of Visits 56    Date for PT Re-Evaluation 06/03/20    Authorization Time Period FOTO- PT    PT Start Time 0930    PT Stop Time 1014    PT Time Calculation (min) 44 min    Equipment Utilized During Treatment Gait belt    Activity Tolerance Patient tolerated treatment well;No increased pain;Patient limited by fatigue    Behavior During Therapy [facility] for tasks assessed/performed           Past Medical History:  Diagnosis Date  . Arthritis   . Bradycardia   . Cancer [facility]) 2013   skin cancer  . Depression    ptsd  . Dysrhythmia    chronic slow heart rate  . GERD (gastroesophageal reflux disease)   . Headache(784.0)    tension headaches non recent  . History of chicken pox   . History of kidney stones    passed  . Hypertension    treated with HCTZ  . Parkinson's disease ([place])    dx'ed 15 years ago  . PTSD (post-traumatic stress disorder)   . Shortness of breath dyspnea   . Sleep apnea    doesn't use C-pap  . Varicose veins     Past Surgical History:  Procedure Laterality Date  . CHOLECYSTECTOMY N/A 10/22/2014   Procedure: LAPAROSCOPIC CHOLECYSTECTOMY WITH INTRAOPERATIVE CHOLANGIOGRAM;  Surgeon: Dia Crawford III, MD;  Location: [facility] ORS;  Service: General;  Laterality: N/A;  . cyst removed      from lip as a child  . INTRAMEDULLARY (IM) NAIL INTERTROCHANTERIC N/A 02/18/2019   Procedure: INTRAMEDULLARY (IM) NAIL INTERTROCHANTRIC, RIGHT,;  Surgeon: Thornton Park, MD;  Location: [facility] ORS;  Service: Orthopedics;  Laterality: N/A;  . LUMBAR LAMINECTOMY/DECOMPRESSION MICRODISCECTOMY  Bilateral 12/14/2012   Procedure: Bilateral lumbar three-four, four-five decompressive laminotomy/foraminotomy;  Surgeon: Charlie Pitter, MD;  Location: [place] NEURO ORS;  Service: Neurosurgery;  Laterality: Bilateral;  . PULSE GENERATOR IMPLANT Bilateral 12/13/2013   Procedure: Bilateral implantable pulse generator placement;  Surgeon: Erline Levine, MD;  Location: [facility] NE[facility]O ORS;  Service: Neurosurgery;  Laterality: Bilateral;  Bilateral implantable pulse generator placement  . skin cancer removed     from ears,   12 lft arm  rt leg 15  . SUBTHALAMIC STIMULATOR BATTERY REPLACEMENT Bilateral 07/14/2017   Procedure: BILATERAL IMPLANTED PULSE GENERATOR CHANGE FOR DEEP BRAIN STIMULATOR;  Surgeon: Erline Levine, MD;  Location: [place];  Service: Neurosurgery;  Laterality: Bilateral;  . SUBTHALAMIC STIMULATOR INSERTION Bilateral 12/06/2013   Procedure: SUBTHALAMIC STIMULATOR INSERTION;  Surgeon: Erline Levine, MD;  Location: [place] NEURO ORS;  Service: Neurosurgery;  Laterality: Bilateral;  Bilateral deep brain stimulator placement    There were no vitals filed for this visit.   Subjective Assessment - 05/13/20 1657    Subjective Patient has had two major falls since seen last. [place] once on Friday with R leg giving out. On the 15th had a major fall requiring him to go to the [place] for a CAT scan due to blurry vision.    Pertinent History [name] is a 70 y.o.  male with a hx of HTN, bradycardia, Parkinson's s/p deep brain stimulator 11/2013 , HLD, depression, possible seizure disorder, arthritis, depression/PTSD, dysrhythmia, GERD, sleep apnea. Patient was seen by this therapist in years past. Patient had a fall around Halloween of 2020 and admitted for ORIF on 02/18/19, was discharged to SNF and had [place] around Christmas. Had home health therapy for a month and a half. After that tried to go to Outpatient Therapy for 3 sessions at [facility] and was not pleased due to how busy and crowded the location was. Fell last  week in bathroom, didn't take walker in, falls about 2x month. Is very weak in RLE. Patient has a new lift chair and U drive walking. Has a caregiver 4 days a week. Has additional order for radiculopathy, lumbar region. PMH of lumbar laminectomy/decompression microdiscectomy (L3-4, 4-5)  In 2014.  Lumbar 4-5 cage +rod (2016).    Limitations Walking;Standing;Sitting;House hold activities;Lifting    How long can you sit comfortably? not limited with a backrest, without a back rest 3 minutes    How long can you stand comfortably? 3-5 minutes with holding on.    How long can you walk comfortably? with U step 6 minutes.    Patient Stated Goals pain reduction of R hip. strength of legs, dance with wife. push lawnmower.    Currently in Pain? Yes    Pain Score 5     Pain Location Back    Pain Orientation Right    Pain Descriptors / Indicators Burning    Pain Onset More than a month ago    Pain Frequency Intermittent             Patient has had two major falls since seen last. [place] once on Friday with R leg giving out. On the 15th had a major fall requiring him to go to the [place] for a CAT scan due to blurry vision.     Supine:  Contract relax RLE pressing into PT shoulder 15x 3 second holds Bridge 15x  3lb ankle weight: -SLR 10x each LE -knee flexion/extension 15x each LE -hip abduction 10x RLE  Leg press:  #40 weight; 15x each LE, 2 sets total each LE   Seated: 3lb ankle weight -holding 2000 gr ball overhead march 15x each LE -holding 2000 gr ball overhead LAQ 15x each LE    Pt educated throughout session about proper posture and technique with exercises. Improved exercise technique, movement at target joints, use of target muscles after min to mod verbal, visual, tactile cues.   Vitals monitored to ensure therapeutic values with rest breaks as needed  Patient returns from break from therapy due to weather not permitting him to leave his home. He has had increased frequency of  falls with additional weakness of RLE. Next session will benefit on focused strengthening in gravity eliminated positions. He requires frequent rest breaks due to recent illness resulting in increased fatigue. Patient would benefit from skilled physical therapy to increase strength, stability, balance, and decrease pain to improve quality of life                     PT Education - 05/13/20 1658    Education provided Yes    Education Details exercise technique, body mechanics    Person(s) Educated Patient    Methods Explanation;Demonstration;Tactile cues;Verbal cues    Comprehension Verbalized understanding;Returned demonstration;Verbal cues required;Tactile cues required            PT Short  Term Goals - 01/08/20 1024      PT SHORT TERM GOAL #1   Title Pt will perform HEP with family's/aide's supervision, for improved balance, transfers, and gait.    Baseline 5/5: HEP given 6/28: HEP compliant 8/23: aide helps with HEP    Time 4    Period Weeks    Status Achieved    Target Date 09/19/19             PT Long Term Goals - 04/27/20 1259      PT LONG TERM GOAL #1   Title Patient will increase Berg Balance score by > 6 points (54/56) to demonstrate decreased fall risk during functional activities.    Baseline 10/20: 48/56 11/10: 49/56 12/22: 45/56 1/10: 39/56    Time 8    Period Weeks    Status Partially Met    Target Date 06/03/20      PT LONG TERM GOAL #2   Title Patient will ambulate within his household for ~ 5 minutes LRAD with no episodes of falling to increase independence with household mobility    Baseline 10/20: requires use of rollator to ambulate 11/10: requires AD 12/22: deferred due to recent increase in falls 1/10 deferred due to falls    Time 8    Period Weeks    Status Deferred    Target Date 06/03/20      PT LONG TERM GOAL #4   Title Patient will increase FOTO score to equal to or greater than 31/100  to demonstrate statistically significant  improvement in mobility and quality of life.    Baseline 5/5: 12/100 6/28: 38% 8/23: 11.7 9/22: 45% 10/20: 47.9% 11/10: 40.2%; 04/01/20: 40% 1/10: 40.3%    Time 8    Period Weeks    Status Achieved      PT LONG TERM GOAL #5   Title Patient will increase six minute walk test distance to >1000 for progression to community ambulator and improve gait ability    Baseline 6/28: 750 ft with U step rollator 8/23: 775 w rolling walker, increased crouch pattern of ambulation 9/22: deferred due to pain; 10/18: 863ft 11/10: 770 ft 12/22: 695 ft with rolator 1/10: 902 ft; L foot drag    Time 8    Period Weeks    Status Partially Met    Target Date 06/03/20      PT LONG TERM GOAL #6   Title Patient will increase Berg Balance score by > 6 points (42/56) to demonstrate decreased fall risk during functional activities.    Baseline 6/28: 36/56 8/23: 42/56 9/22 44/56 10/20: 48/56    Time 8    Period Weeks    Status Achieved      PT LONG TERM GOAL #7   Title Patient will report a worst pain of 3/10 on VAS in low back to improve tolerance with ADLs and reduced symptoms with activities.    Baseline 8/9: 9/10 8/23: 7/10 pain 9/22: 7/10 10/20: 5/10 11/10:  one episode of 9/10 when fell  the rest 4-6/10 12/22: 4/10    Time 8    Period Weeks    Status Partially Met      PT LONG TERM GOAL #8   Title Patient will reduce modified Oswestry score to <20 as to demonstrate minimal disability with ADLs including improved sleeping tolerance, walking/sitting tolerance etc for better mobility with ADLs.    Baseline 8/9: 64% 8/23: 66% 9/22:44% 10/20: 40% 11/20: 54% 12/22: 52% 1/10:  56%  Time 8    Period Weeks    Status Partially Met    Target Date 06/03/20                 Plan - 05/13/20 1703    Clinical Impression Statement Patient returns from break from therapy due to weather not permitting him to leave his home. He has had increased frequency of falls with additional weakness of RLE. Next session will  benefit on focused strengthening in gravity eliminated positions. He requires frequent rest breaks due to recent illness resulting in increased fatigue. Patient would benefit from skilled physical therapy to increase strength, stability, balance, and decrease pain to improve quality of life    Personal Factors and Comorbidities Age;Comorbidity 3+;Past/Current Experience;Time since onset of injury/illness/exacerbation;Transportation    Comorbidities athritis, bradycardia, cancer, depression, dysrhythmia, GERD, HTN, Parkinson's, PTSD, SOB, sleep apnea    Examination-Activity Limitations Bathing;Bed Mobility;Caring for Others;Bend;Dressing;Lift;Locomotion Level;Reach Overhead;Sit;Squat;Stairs;Stand;Toileting;Transfers    Examination-Participation Restrictions Church;Cleaning;Community Activity;Interpersonal Relationship;Shop;Volunteer;Yard Work;Other    Stability/Clinical Decision Making Stable/Uncomplicated    Rehab Potential Fair    PT Frequency 2x / week    PT Duration 8 weeks    PT Treatment/Interventions ADLs/Self Care Home Management;Aquatic Therapy;Cryotherapy;Electrical Stimulation;Iontophoresis 4m/ml Dexamethasone;Moist Heat;Ultrasound;Contrast Bath;DME Instruction;Gait training;Stair training;Functional mobility training;Therapeutic activities;Therapeutic exercise;Balance training;Neuromuscular re-education;Patient/family education;Manual techniques;Wheelchair mobility training;Energy conservation;Passive range of motion;Traction;Orthotic Fit/Training;Dry needling;Vestibular;Visual/perceptual remediation/compensation;Taping;Canalith Repostioning    PT Next Visit Plan core and pain relief    PT Home Exercise Plan [place]gree with Plan of Care Patient;Family member/caregiver    Family Member Consulted wife           Patient will benefit from skilled therapeutic intervention in order to improve the following deficits and impairments:  Abnormal gait,Decreased  balance,Decreased coordination,Decreased mobility,Impaired tone,Postural dysfunction,Decreased strength,Decreased safety awareness,Improper body mechanics,Impaired flexibility,Decreased activity tolerance,Decreased endurance,Decreased knowledge of precautions,Difficulty walking,Pain,Cardiopulmonary status limiting activity,Decreased range of motion,Impaired perceived functional ability  Visit Diagnosis: Other lack of coordination  Parkinson's disease ([place]  Muscle weakness (generalized)  Unsteadiness on feet     Problem List Patient Active Problem List   Diagnosis Date Noted  . Vertigo 02/19/2020  . Urine abnormality 02/19/2020  . E. coli bacteremia 02/23/2019  . Dysphagia 02/23/2019  . Aspiration pneumonia ([place] 02/21/2019  . Closed right hip fracture ([place] 02/16/2019  . Rib pain on right side 09/25/2018  . Skin sore 12/01/2017  . Hematoma 10/18/2017  . Fall at home 10/18/2017  . 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 Radicular pain in right arm 10/09/2016  . GERD (gastroesophageal reflux disease) 07/31/2016  . Trochanteric bursitis 03/03/2016  . Left hand pain 10/30/2015  . Fracture, finger, multiple sites 10/30/2015  . Colon cancer screening 07/23/2015  . Healthcare maintenance 07/23/2015  . Gout 02/18/2015  . Depression 01/06/2015  . Irritation of eyelid 08/20/2014  . Dupuytren's contracture 08/20/2014  . Cough 07/21/2014  . Lumbar stenosis with neurogenic claudication 06/13/2014  . Preop cardiovascular exam 05/28/2014  . S/P deep brain stimulator placement 05/08/2014  . Joint pain 01/22/2014  . Medicare annual wellness visit, initial 01/19/2014  . Advance care planning 01/19/2014  . Parkinson's disease ([place] 12/13/2013  . PTSD (post-traumatic stress disorder) 06/13/2013  . Erectile dysfunction 06/13/2013  . HLD (hyperlipidemia) 06/13/2013  . Hip pain 06/10/2013  . Pain in joint, shoulder region 06/10/2013  . Right leg swelling 06/03/2013  . Essential  hypertension 06/03/2013  . Bradycardia by electrocardiogram 06/03/2013  . Obstructive sleep apnea 03/12/2013  . Spinal stenosis, lumbar region, with neurogenic claudication  12/14/2012   Janna Arch, PT, DPT   05/13/2020, 5:04 PM  [place] MAIN [facility] SERVICES [address] [place], [place], [address] Phone: [phone]   Fax:  [phone]  Name: IRELAND CHAGNON MRN: [id number] Date of Birth: 08/29/1950

## 2020-05-14 DIAGNOSIS — R001 Bradycardia, unspecified: Secondary | ICD-10-CM | POA: Diagnosis not present

## 2020-05-18 ENCOUNTER — Other Ambulatory Visit: Payer: Self-pay

## 2020-05-18 ENCOUNTER — Encounter: Payer: Self-pay | Admitting: Occupational Therapy

## 2020-05-18 ENCOUNTER — Ambulatory Visit: Payer: Medicare PPO

## 2020-05-18 ENCOUNTER — Ambulatory Visit: Payer: Medicare PPO | Admitting: Occupational Therapy

## 2020-05-18 DIAGNOSIS — M6281 Muscle weakness (generalized): Secondary | ICD-10-CM

## 2020-05-18 DIAGNOSIS — G20 Parkinson's disease: Secondary | ICD-10-CM

## 2020-05-18 DIAGNOSIS — R296 Repeated falls: Secondary | ICD-10-CM | POA: Diagnosis not present

## 2020-05-18 DIAGNOSIS — R278 Other lack of coordination: Secondary | ICD-10-CM

## 2020-05-18 DIAGNOSIS — R2681 Unsteadiness on feet: Secondary | ICD-10-CM | POA: Diagnosis not present

## 2020-05-18 DIAGNOSIS — R2689 Other abnormalities of gait and mobility: Secondary | ICD-10-CM | POA: Diagnosis not present

## 2020-05-18 NOTE — Therapy (Signed)
[place] [facility] SERVICES [address] [place], [place], [address] Phone: [phone]   Fax:  [phone]  Physical Therapy Treatment  Patient Details  Name: [name] MRN: [id number] Date of Birth: 02-Nov-1950 Referring Provider (PT): Sharolyn Douglas MD    Encounter Date: 05/18/2020   PT End of Session - 05/18/20 0901    Visit Number 44    Number of Visits 56    Date for PT Re-Evaluation 06/03/20    Authorization Time Period FOTO- PT    PT Start Time 0931    PT Stop Time 1014    PT Time Calculation (min) 43 min    Equipment Utilized During Treatment Gait belt    Activity Tolerance Patient tolerated treatment well;No increased pain;Patient limited by fatigue    Behavior During Therapy [facility] for tasks assessed/performed           Past Medical History:  Diagnosis Date  . Arthritis   . Bradycardia   . Cancer [facility]) 2013   skin cancer  . Depression    ptsd  . Dysrhythmia    chronic slow heart rate  . GERD (gastroesophageal reflux disease)   . Headache(784.0)    tension headaches non recent  . History of chicken pox   . History of kidney stones    passed  . Hypertension    treated with HCTZ  . Parkinson's disease ([place])    dx'ed 15 years ago  . PTSD (post-traumatic stress disorder)   . Shortness of breath dyspnea   . Sleep apnea    doesn't use C-pap  . Varicose veins     Past Surgical History:  Procedure Laterality Date  . CHOLECYSTECTOMY N/A 10/22/2014   Procedure: LAPAROSCOPIC CHOLECYSTECTOMY WITH INTRAOPERATIVE CHOLANGIOGRAM;  Surgeon: Dia Crawford III, MD;  Location: [facility] ORS;  Service: General;  Laterality: N/A;  . cyst removed      from lip as a child  . INTRAMEDULLARY (IM) NAIL INTERTROCHANTERIC N/A 02/18/2019   Procedure: INTRAMEDULLARY (IM) NAIL INTERTROCHANTRIC, RIGHT,;  Surgeon: Thornton Park, MD;  Location: [facility] ORS;  Service: Orthopedics;  Laterality: N/A;  . LUMBAR LAMINECTOMY/DECOMPRESSION MICRODISCECTOMY  Bilateral 12/14/2012   Procedure: Bilateral lumbar three-four, four-five decompressive laminotomy/foraminotomy;  Surgeon: Charlie Pitter, MD;  Location: [place] [facility]O ORS;  Service: Neurosurgery;  Laterality: Bilateral;  . PULSE GENERATOR IMPLANT Bilateral 12/13/2013   Procedure: Bilateral implantable pulse generator placement;  Surgeon: Erline Levine, MD;  Location: [place] [facility] ORS;  Service: Neurosurgery;  Laterality: Bilateral;  Bilateral implantable pulse generator placement  . skin cancer removed     from ears,   12 lft arm  rt leg 15  . SUBTHALAMIC STIMULATOR BATTERY REPLACEMENT Bilateral 07/14/2017   Procedure: BILATERAL IMPLANTED PULSE GENERATOR CHANGE FOR DEEP BRAIN STIMULATOR;  Surgeon: Erline Levine, MD;  Location: [place];  Service: Neurosurgery;  Laterality: Bilateral;  . SUBTHALAMIC STIMULATOR INSERTION Bilateral 12/06/2013   Procedure: SUBTHALAMIC STIMULATOR INSERTION;  Surgeon: Erline Levine, MD;  Location: [place] [facility] ORS;  Service: Neurosurgery;  Laterality: Bilateral;  Bilateral deep brain stimulator placement    There were no vitals filed for this visit.   Subjective Assessment - 05/18/20 0930    Subjective Since seen last patient had imaging done for placement of DBS. Reports compliance with HEP.    Pertinent History [name] is a 70 y.o. male with a hx of HTN, bradycardia, Parkinson's s/p deep brain stimulator 11/2013 , HLD, depression, possible seizure disorder, arthritis, depression/PTSD, dysrhythmia, GERD, sleep apnea.  Patient was seen by this therapist in years past. Patient had a fall around Halloween of 2020 and admitted for ORIF on 02/18/19, was discharged to SNF and had [place] around Christmas. Had home health therapy for a month and a half. After that tried to go to Outpatient Therapy for 3 sessions at [facility] and was not pleased due to how busy and crowded the location was. Fell last week in bathroom, didn't take walker in, falls about 2x month. Is very weak in RLE. Patient has a  new lift chair and U drive walking. Has a caregiver 4 days a week. Has additional order for radiculopathy, lumbar region. PMH of lumbar laminectomy/decompression microdiscectomy (L3-4, 4-5)  In 2014.  Lumbar 4-5 cage +rod (2016).    Limitations Walking;Standing;Sitting;House hold activities;Lifting    How long can you sit comfortably? not limited with a backrest, without a back rest 3 minutes    How long can you stand comfortably? 3-5 minutes with holding on.    How long can you walk comfortably? with U step 6 minutes.    Patient Stated Goals pain reduction of R hip. strength of legs, dance with wife. push lawnmower.    Currently in Pain? Yes    Pain Score 4     Pain Location Leg    Pain Orientation Right;Left    Pain Descriptors / Indicators Aching    Pain Type Acute pain    Pain Onset More than a month ago              Supine:  Contract relax RLE pressing into PT shoulder 15x 3 second holds Bridge 15x  Green swiss ball  -hamstring curl 12x cues for alignment -hamstring curl single leg 12x with cues for alignment -TrA contraction 15x 3 second holds pressing into ball with knees and hands -TrA contraction with alternating UE raise 10x each UE   3lb ankle weight: -SLR 10x each LE -knee flexion/extension 15x each LE -hip abduction 10x each LE -SAQ 15x each LE      Seated: 3lb ankle weight -holding 2000 gr ball overhead march 15x each LE -holding 2000 gr ball overhead LAQ 15x each LE  Balloon taps reaching inside/outside BOS x 4 minutes for coordination spatial awareness and sequencing      Pt educated throughout session about proper posture and technique with exercises. Improved exercise technique, movement at target joints, use of target muscles after min to mod verbal, visual, tactile cues.   Vitals monitored to ensure therapeutic values with rest breaks as needed                            PT Education - 05/18/20 0900    Education provided Yes     Education Details exercise technique, body mechanics    Person(s) Educated Patient    Methods Explanation;Demonstration;Tactile cues;Verbal cues    Comprehension Verbalized understanding;Returned demonstration;Verbal cues required;Tactile cues required            PT Short Term Goals - 01/08/20 1024      PT SHORT TERM GOAL #1   Title Pt will perform HEP with family's/aide's supervision, for improved balance, transfers, and gait.    Baseline 5/5: HEP given 6/28: HEP compliant 8/23: aide helps with HEP    Time 4    Period Weeks    Status Achieved    Target Date 09/19/19  PT Long Term Goals - 04/27/20 1259      PT LONG TERM GOAL #1   Title Patient will increase Berg Balance score by > 6 points (54/56) to demonstrate decreased fall risk during functional activities.    Baseline 10/20: 48/56 11/10: 49/56 12/22: 45/56 1/10: 39/56    Time 8    Period Weeks    Status Partially Met    Target Date 06/03/20      PT LONG TERM GOAL #2   Title Patient will ambulate within his household for ~ 5 minutes LRAD with no episodes of falling to increase independence with household mobility    Baseline 10/20: requires use of rollator to ambulate 11/10: requires AD 12/22: deferred due to recent increase in falls 1/10 deferred due to falls    Time 8    Period Weeks    Status Deferred    Target Date 06/03/20      PT LONG TERM GOAL #4   Title Patient will increase FOTO score to equal to or greater than 31/100  to demonstrate statistically significant improvement in mobility and quality of life.    Baseline 5/5: 12/100 6/28: 38% 8/23: 11.7 9/22: 45% 10/20: 47.9% 11/10: 40.2%; 04/01/20: 40% 1/10: 40.3%    Time 8    Period Weeks    Status Achieved      PT LONG TERM GOAL #5   Title Patient will increase six minute walk test distance to >1000 for progression to community ambulator and improve gait ability    Baseline 6/28: 750 ft with U step rollator 8/23: 775 w rolling walker,  increased crouch pattern of ambulation 9/22: deferred due to pain; 10/18: 869ft 11/10: 770 ft 12/22: 695 ft with rolator 1/10: 902 ft; L foot drag    Time 8    Period Weeks    Status Partially Met    Target Date 06/03/20      PT LONG TERM GOAL #6   Title Patient will increase Berg Balance score by > 6 points (42/56) to demonstrate decreased fall risk during functional activities.    Baseline 6/28: 36/56 8/23: 42/56 9/22 44/56 10/20: 48/56    Time 8    Period Weeks    Status Achieved      PT LONG TERM GOAL #7   Title Patient will report a worst pain of 3/10 on VAS in low back to improve tolerance with ADLs and reduced symptoms with activities.    Baseline 8/9: 9/10 8/23: 7/10 pain 9/22: 7/10 10/20: 5/10 11/10:  one episode of 9/10 when fell  the rest 4-6/10 12/22: 4/10    Time 8    Period Weeks    Status Partially Met      PT LONG TERM GOAL #8   Title Patient will reduce modified Oswestry score to <20 as to demonstrate minimal disability with ADLs including improved sleeping tolerance, walking/sitting tolerance etc for better mobility with ADLs.    Baseline 8/9: 64% 8/23: 66% 9/22:44% 10/20: 40% 11/20: 54% 12/22: 52% 1/10:  56%    Time 8    Period Weeks    Status Partially Met    Target Date 06/03/20                 Plan - 05/18/20 8841    Clinical Impression Statement Patient is very fatigued this session requiring more frequent cueing for task orientation and focus on muscle recruitment. Patient is challenged with neutral alignment of LE's with preference for adducting bilateral  limbs.  Patient would benefit from skilled physical therapy to increase strength, stability, balance, and decrease pain to improve quality of life    Personal Factors and Comorbidities Age;Comorbidity 3+;Past/Current Experience;Time since onset of injury/illness/exacerbation;Transportation    Comorbidities athritis, bradycardia, cancer, depression, dysrhythmia, GERD, HTN, Parkinson's, PTSD, SOB, sleep  apnea    Examination-Activity Limitations Bathing;Bed Mobility;Caring for Others;Bend;Dressing;Lift;Locomotion Level;Reach Overhead;Sit;Squat;Stairs;Stand;Toileting;Transfers    Examination-Participation Restrictions Church;Cleaning;Community Activity;Interpersonal Relationship;Shop;Volunteer;Yard Work;Other    Stability/Clinical Decision Making Stable/Uncomplicated    Rehab Potential Fair    PT Frequency 2x / week    PT Duration 8 weeks    PT Treatment/Interventions ADLs/Self Care Home Management;Aquatic Therapy;Cryotherapy;Electrical Stimulation;Iontophoresis 4mg /ml Dexamethasone;Moist Heat;Ultrasound;Contrast Bath;DME Instruction;Gait training;Stair training;Functional mobility training;Therapeutic activities;Therapeutic exercise;Balance training;Neuromuscular re-education;Patient/family education;Manual techniques;Wheelchair mobility training;Energy conservation;Passive range of motion;Traction;Orthotic Fit/Training;Dry needling;Vestibular;Visual/perceptual remediation/compensation;Taping;Canalith Repostioning    PT Next Visit Plan core and pain relief    PT Home Exercise Plan [place] and Agree with Plan of Care Patient;Family member/caregiver    Family Member Consulted wife           Patient will benefit from skilled therapeutic intervention in order to improve the following deficits and impairments:  Abnormal gait,Decreased balance,Decreased coordination,Decreased mobility,Impaired tone,Postural dysfunction,Decreased strength,Decreased safety awareness,Improper body mechanics,Impaired flexibility,Decreased activity tolerance,Decreased endurance,Decreased knowledge of precautions,Difficulty walking,Pain,Cardiopulmonary status limiting activity,Decreased range of motion,Impaired perceived functional ability  Visit Diagnosis: Muscle weakness (generalized)  Other lack of coordination  Parkinson's disease ([place])  Unsteadiness on feet     Problem List Patient Active  Problem List   Diagnosis Date Noted  . Vertigo 02/19/2020  . Urine abnormality 02/19/2020  . E. coli bacteremia 02/23/2019  . Dysphagia 02/23/2019  . Aspiration pneumonia ([place]) 02/21/2019  . Closed right hip fracture ([place]) 02/16/2019  . Rib pain on right side 09/25/2018  . Skin sore 12/01/2017  . Hematoma 10/18/2017  . Fall at home 10/18/2017  . 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 Radicular pain in right arm 10/09/2016  . GERD (gastroesophageal reflux disease) 07/31/2016  . Trochanteric bursitis 03/03/2016  . Left hand pain 10/30/2015  . Fracture, finger, multiple sites 10/30/2015  . Colon cancer screening 07/23/2015  . Healthcare maintenance 07/23/2015  . Gout 02/18/2015  . Depression 01/06/2015  . Irritation of eyelid 08/20/2014  . Dupuytren's contracture 08/20/2014  . Cough 07/21/2014  . Lumbar stenosis with neurogenic claudication 06/13/2014  . Preop cardiovascular exam 05/28/2014  . S/P deep brain stimulator placement 05/08/2014  . Joint pain 01/22/2014  . Medicare annual wellness visit, initial 01/19/2014  . Advance care planning 01/19/2014  . Parkinson's disease ([place]) 12/13/2013  . PTSD (post-traumatic stress disorder) 06/13/2013  . Erectile dysfunction 06/13/2013  . HLD (hyperlipidemia) 06/13/2013  . Hip pain 06/10/2013  . Pain in joint, shoulder region 06/10/2013  . Right leg swelling 06/03/2013  . Essential hypertension 06/03/2013  . Bradycardia by electrocardiogram 06/03/2013  . Obstructive sleep apnea 03/12/2013  . Spinal stenosis, lumbar region, with neurogenic claudication 12/14/2012   Janna Arch, PT, DPT   05/18/2020, 10:16 AM  [place] [address] [place], [place], [address] Phone: [phone]   Fax:  [phone]  Name: EUSEBIO BLAZEJEWSKI MRN: [id number] Date of Birth: May 13, 1950

## 2020-05-18 NOTE — Therapy (Signed)
[place] MAIN [facility] SERVICES [address] [place], [place], [address] Phone: [phone]   Fax:  [phone]  Occupational Therapy Treatment  Patient Details  Name: KANNAN PROIA MRN: [id number] Date of Birth: [birth year] No data recorded  Encounter Date: 05/18/2020   OT End of Session - 05/18/20 0901    Visit Number 27    Number of Visits 48    Date for OT Re-Evaluation 06/01/20    Authorization Type reporting period starting 02/05/2020    OT Start Time 0845    OT Stop Time 0930    OT Time Calculation (min) 45 min    Activity Tolerance Patient tolerated treatment well    Behavior During Therapy [facility] for tasks assessed/performed           Past Medical History:  Diagnosis Date  . Arthritis   . Bradycardia   . Cancer [facility]) 2013   skin cancer  . Depression    ptsd  . Dysrhythmia    chronic slow heart rate  . GERD (gastroesophageal reflux disease)   . Headache(784.0)    tension headaches non recent  . History of chicken pox   . History of kidney stones    passed  . Hypertension    treated with HCTZ  . Parkinson's disease ([place])    dx'ed 15 years ago  . PTSD (post-traumatic stress disorder)   . Shortness of breath dyspnea   . Sleep apnea    doesn't use C-pap  . Varicose veins     Past Surgical History:  Procedure Laterality Date  . CHOLECYSTECTOMY N/A 10/22/2014   Procedure: LAPAROSCOPIC CHOLECYSTECTOMY WITH INTRAOPERATIVE CHOLANGIOGRAM;  Surgeon: Dia Crawford III, MD;  Location: [facility] ORS;  Service: General;  Laterality: N/A;  . cyst removed      from lip as a child  . INTRAMEDULLARY (IM) NAIL INTERTROCHANTERIC N/A 02/18/2019   Procedure: INTRAMEDULLARY (IM) NAIL INTERTROCHANTRIC, RIGHT,;  Surgeon: Thornton Park, MD;  Location: [facility] ORS;  Service: Orthopedics;  Laterality: N/A;  . LUMBAR LAMINECTOMY/DECOMPRESSION MICRODISCECTOMY Bilateral 12/14/2012   Procedure: Bilateral lumbar three-four, four-five decompressive  laminotomy/foraminotomy;  Surgeon: Charlie Pitter, MD;  Location: [place] [facility]URO ORS;  Service: Neurosurgery;  Laterality: Bilateral;  . PULSE GENERATOR IMPLANT Bilateral 12/13/2013   Procedure: Bilateral implantable pulse generator placement;  Surgeon: Erline Levine, MD;  Location: [place] NEURO ORS;  Service: Neurosurgery;  Laterality: Bilateral;  Bilateral implantable pulse generator placement  . skin cancer removed     from ears,   12 lft arm  rt leg 15  . SUBTHALAMIC STIMULATOR BATTERY REPLACEMENT Bilateral 07/14/2017   Procedure: BILATERAL IMPLANTED PULSE GENERATOR CHANGE FOR DEEP BRAIN STIMULATOR;  Surgeon: Erline Levine, MD;  Location: [place];  Service: Neurosurgery;  Laterality: Bilateral;  . SUBTHALAMIC STIMULATOR INSERTION Bilateral 12/06/2013   Procedure: SUBTHALAMIC STIMULATOR INSERTION;  Surgeon: Erline Levine, MD;  Location: [place] NEURO ORS;  Service: Neurosurgery;  Laterality: Bilateral;  Bilateral deep brain stimulator placement    There were no vitals filed for this visit.   Subjective Assessment - 05/18/20 0850    Patient is accompanied by: Family member    Pertinent History Pt. is a 70 yo male who has a history of Parkinson's Disease. Pt. has a Deep Brain Stimulator in place. Pt. has a history of Low Back Pain with 2 back surgeries in August 2014, and February 2016. Pt. has a history of multiple frequent falls, unsure of how many falls in the last 6 months.  Patient Stated Goals Pt would like to be more independent daily.    Currently in Pain? Yes    Pain Score 3     Pain Location Leg    Pain Orientation Left;Right    Pain Descriptors / Indicators Burning    Pain Type Acute pain    Pain Onset More than a month ago    Multiple Pain Sites No          OT Treatment  Therapeutic Exercise:  Pt. performed2# dowel ex.for UE strengthening secondary to weakness. Bilateral shoulder flexion, chest press,andcircular patterns were performed1 set of 10- 20reps each.Pt. Performed2#  dumbbell ex.for elbow flexion, extension, forearm supination/pronation, wrist flexion/extension.Pt. requires rest breaks and verbal cues, tactile cues, and cues for visual demonstrationfor proper technique.Pt. required increased time to complete.  Pt. Reports not having any falls this past weekend. Pt.'s wife reports that they ordered a long handled fork, and a sectional plate at the [place].  Pt.requiredincreased time,verbal, tactile, and visual cues for proper there.ex. form, and pace during the exercises. Pt. continues to work on improving UE strength, [facility] skill in order to work towards improving, and maximizing independence with ADLs, and IADLs.                      OT Education - 05/18/20 0901    Education provided Yes    Education Details A/E use, left hand strength    Person(s) Educated Patient;Spouse    Methods Explanation    Comprehension Verbalized understanding               OT Long Term Goals - 03/18/20 1355      OT LONG TERM GOAL #1   Title Pt. will improve Bilateral UE strength by 1 mm grade to assist with ADLs, and IADLs    Baseline BUE strength 4/5 overall at eval    Time 12    Period Weeks    Status On-going    Target Date 06/01/20      OT LONG TERM GOAL #2   Title Pt. will improve grip strength by 10# to open jars and containers.    Baseline Pt. conitnues to have difficulty opening jars    Time 12    Period Weeks    Target Date 06/01/20      OT LONG TERM GOAL #3   Title Patient will complete lower body dressing with modified independence.    Baseline min to mod assist at eval; sock aid supervision, Mod A doff R sock, IND doff L    Time 12    Period Weeks    Status Partially Met    Target Date 06/01/20      OT LONG TERM GOAL #4   Title Patient to demonstrate the ability to vacuum flooring at home with use of modified techniques with supervision.    Baseline Pt. continues to hav difficulty    Time 12    Period Weeks    Status  On-going    Target Date 06/01/20      OT LONG TERM GOAL #5   Title Patient will demonstrate the ability to retrieve a snack and transport it to the living room with modified independence.    Baseline Pt. conitnues to work on transporting items    Time 12    Period Weeks    Status On-going    Target Date 06/11/20      OT LONG TERM GOAL #6   Title Patient will  improve hand function to be able to perform cutting of food with modified independence.    Baseline Pt. is improving, however continues to have difficulty    Time 12    Period Weeks    Status Partially Met    Target Date 06/01/20      OT LONG TERM GOAL #7   Title Pt. will write a paragraph efficiently with 90% legibility.    Baseline Limited writing legibility and poor hand endurance for writing more than 1 sentence.    Time 12    Period Weeks    Status On-going    Target Date 06/01/20      OT LONG TERM GOAL #8   Title Pt. will improve typing speed, and accuracy to be able to to type emails efficently    Baseline Continues to be limited    Time 12    Period Weeks    Status On-going    Target Date 06/01/20      OT LONG TERM GOAL  #9   Baseline Pt will transfers in and out of the shower with stand-by assistance    Time 12    Period Weeks    Status On-going    Target Date 06/01/20      OT LONG TERM GOAL  #10   TITLE Pt will don and doff pants with supervision    Baseline min assist to manage pants    Time 12    Period Weeks    Status On-going    Target Date 06/01/20                 Plan - 05/18/20 0903    Clinical Impression Statement Pt. Reports not having any falls this past weekend. Pt.'s wife reports that they ordered a long handled fork, and a sectional plate at the [place].  Pt.requiredincreased time,verbal, tactile, and visual cues for proper there.ex. form, and pace during the exercises. Pt. continues to work on improving UE strength, [facility] skill in order to work towards improving, and maximizing  independence with ADLs, and IADLs.   OT Occupational Profile and History Detailed Assessment- Review of Records and additional review of physical, cognitive, psychosocial history related to current functional performance    Occupational performance deficits (Please refer to evaluation for details): ADL's;IADL's;Leisure    Body Structure / Function / Physical Skills ADL;Coordination;Endurance;GMC;UE functional use;Balance;Decreased knowledge of use of DME;IADL;Pain;Dexterity;FMC;Strength;Mobility;ROM    Cognitive Skills Attention;Problem Solve;Memory    Psychosocial Skills Coping Strategies;Environmental  Adaptations;Habits;Routines and Behaviors    Rehab Potential Good    Clinical Decision Making Limited treatment options, no task modification necessary    Comorbidities Affecting Occupational Performance: Presence of comorbidities impacting occupational performance    Comorbidities impacting occupational performance description: risk for falls, slow to respond and process information at times, safety    Modification or Assistance to Complete Evaluation  No modification of tasks or assist necessary to complete eval    OT Frequency 2x / week    OT Duration 2 weeks    OT Treatment/Interventions Self-care/ADL training;Therapeutic exercise;DME and/or AE instruction;Energy conservation;Neuromuscular education;Patient/family education;Therapeutic activities;Moist Heat;Functional Mobility Training;Cognitive remediation/compensation    Consulted and Agree with Plan of Care Patient           Patient will benefit from skilled therapeutic intervention in order to improve the following deficits and impairments:   Body Structure / Function / Physical Skills: ADL,Coordination,Endurance,GMC,UE functional use,Balance,Decreased knowledge of use of DME,IADL,Pain,Dexterity,FMC,Strength,Mobility,ROM Cognitive Skills: Attention,Problem Solve,Memory Psychosocial Skills: Coping Strategies,Environmental  Adaptations,Habits,Routines and Behaviors   Visit Diagnosis: Muscle weakness (generalized)  Other lack of coordination    Problem List Patient Active Problem List   Diagnosis Date Noted  . Vertigo 02/19/2020  . Urine abnormality 02/19/2020  . E. coli bacteremia 02/23/2019  . Dysphagia 02/23/2019  . Aspiration pneumonia ([place]) 02/21/2019  . Closed right hip fracture ([place]) 02/16/2019  . Rib pain on right side 09/25/2018  . Skin sore 12/01/2017  . Hematoma 10/18/2017  . Fall at home 10/18/2017  . 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 Radicular pain in right arm 10/09/2016  . GERD (gastroesophageal reflux disease) 07/31/2016  . Trochanteric bursitis 03/03/2016  . Left hand pain 10/30/2015  . Fracture, finger, multiple sites 10/30/2015  . Colon cancer screening 07/23/2015  . Healthcare maintenance 07/23/2015  . Gout 02/18/2015  . Depression 01/06/2015  . Irritation of eyelid 08/20/2014  . Dupuytren's contracture 08/20/2014  . Cough 07/21/2014  . Lumbar stenosis with neurogenic claudication 06/13/2014  . Preop cardiovascular exam 05/28/2014  . S/P deep brain stimulator placement 05/08/2014  . Joint pain 01/22/2014  . Medicare annual wellness visit, initial 01/19/2014  . Advance care planning 01/19/2014  . Parkinson's disease ([place]) 12/13/2013  . PTSD (post-traumatic stress disorder) 06/13/2013  . Erectile dysfunction 06/13/2013  . HLD (hyperlipidemia) 06/13/2013  . Hip pain 06/10/2013  . Pain in joint, shoulder region 06/10/2013  . Right leg swelling 06/03/2013  . Essential hypertension 06/03/2013  . Bradycardia by electrocardiogram 06/03/2013  . Obstructive sleep apnea 03/12/2013  . Spinal stenosis, lumbar region, with neurogenic claudication 12/14/2012    Harrel Carina, MS, OTR/L 05/18/2020, 9:10 AM  [place] MAIN [facility] SERVICES [place], [place], [address] Phone: [phone]   Fax:  [phone]  Name:  JOSEL KEO MRN: [id number] Date of Birth: 08/30/1950

## 2020-05-20 ENCOUNTER — Ambulatory Visit: Payer: Medicare PPO

## 2020-05-20 ENCOUNTER — Telehealth: Payer: Self-pay | Admitting: Cardiovascular Disease

## 2020-05-20 ENCOUNTER — Ambulatory Visit: Payer: Medicare PPO | Admitting: Occupational Therapy

## 2020-05-20 ENCOUNTER — Ambulatory Visit: Payer: Medicare PPO | Attending: Neurology

## 2020-05-20 ENCOUNTER — Encounter: Payer: Self-pay | Admitting: Occupational Therapy

## 2020-05-20 ENCOUNTER — Other Ambulatory Visit: Payer: Self-pay

## 2020-05-20 DIAGNOSIS — G20 Parkinson's disease: Secondary | ICD-10-CM

## 2020-05-20 DIAGNOSIS — M6281 Muscle weakness (generalized): Secondary | ICD-10-CM | POA: Diagnosis not present

## 2020-05-20 DIAGNOSIS — R278 Other lack of coordination: Secondary | ICD-10-CM

## 2020-05-20 DIAGNOSIS — R2681 Unsteadiness on feet: Secondary | ICD-10-CM | POA: Diagnosis not present

## 2020-05-20 NOTE — Telephone Encounter (Signed)
Patient spouse came by office  States they are unsure if they placed the ZIO monitor on correctly  Would like to discuss and possibly come by for a new one Please call to discuss

## 2020-05-20 NOTE — Telephone Encounter (Signed)
Patient came in today for me to look at his Zio monitor as it was a home placement and they did not think they placed it correctly.   Called [employer] they stated that it looked like the monitor was activated 05/08/2020. Patients wife disagreed with this. She stated that patient had to have an MRI and he could not wear the monitor for that so they did not place the monitor until 05/14/2020.   I called Addison Bailey manager to verify what should be done.   Almyra Free stated that I should:   - Take the monitor off that the patient is currently wearing and mail back to [facility].  - She would put in a note to No charge the one we was taking off the patient.  - Place a new Zio monitor on the patient.  - And e-mail her the new monitor   The new monitor number has been emailed to [place] [phone])

## 2020-05-20 NOTE — Therapy (Signed)
[place] MAIN [facility] SERVICES [address] [place], [place], [address] Phone: [phone]   Fax:  [phone]  Occupational Therapy Treatment  Patient Details  Name: [name] MRN: [id number] Date of Birth: June 10, 1950 No data recorded  Encounter Date: 05/20/2020   OT End of Session - 05/20/20 0852    Visit Number 28    Number of Visits 48    Date for OT Re-Evaluation 06/01/20    Authorization Type reporting period starting 02/05/2020    OT Start Time 0845    OT Stop Time 0930    OT Time Calculation (min) 45 min    Activity Tolerance Patient tolerated treatment well    Behavior During Therapy [facility] for tasks assessed/performed           Past Medical History:  Diagnosis Date  . Arthritis   . Bradycardia   . Cancer [facility]) 2013   skin cancer  . Depression    ptsd  . Dysrhythmia    chronic slow heart rate  . GERD (gastroesophageal reflux disease)   . Headache(784.0)    tension headaches non recent  . History of chicken pox   . History of kidney stones    passed  . Hypertension    treated with HCTZ  . Parkinson's disease ([place])    dx'ed 15 years ago  . PTSD (post-traumatic stress disorder)   . Shortness of breath dyspnea   . Sleep apnea    doesn't use C-pap  . Varicose veins     Past Surgical History:  Procedure Laterality Date  . CHOLECYSTECTOMY N/A 10/22/2014   Procedure: LAPAROSCOPIC CHOLECYSTECTOMY WITH INTRAOPERATIVE CHOLANGIOGRAM;  Surgeon: Dia Crawford III, MD;  Location: [facility] ORS;  Service: General;  Laterality: N/A;  . cyst removed      from lip as a child  . INTRAMEDULLARY (IM) NAIL INTERTROCHANTERIC N/A 02/18/2019   Procedure: INTRAMEDULLARY (IM) NAIL INTERTROCHANTRIC, RIGHT,;  Surgeon: Thornton Park, MD;  Location: [facility] ORS;  Service: Orthopedics;  Laterality: N/A;  . LUMBAR LAMINECTOMY/DECOMPRESSION MICRODISCECTOMY Bilateral 12/14/2012   Procedure: Bilateral lumbar three-four, four-five decompressive  laminotomy/foraminotomy;  Surgeon: Charlie Pitter, MD;  Location: [place] NEURO ORS;  Service: Neurosurgery;  Laterality: Bilateral;  . PULSE GENERATOR IMPLANT Bilateral 12/13/2013   Procedure: Bilateral implantable pulse generator placement;  Surgeon: Erline Levine, MD;  Location: [place] NEURO ORS;  Service: Neurosurgery;  Laterality: Bilateral;  Bilateral implantable pulse generator placement  . skin cancer removed     from ears,   12 lft arm  rt leg 15  . SUBTHALAMIC STIMULATOR BATTERY REPLACEMENT Bilateral 07/14/2017   Procedure: BILATERAL IMPLANTED PULSE GENERATOR CHANGE FOR DEEP BRAIN STIMULATOR;  Surgeon: Erline Levine, MD;  Location: [place];  Service: Neurosurgery;  Laterality: Bilateral;  . SUBTHALAMIC STIMULATOR INSERTION Bilateral 12/06/2013   Procedure: SUBTHALAMIC STIMULATOR INSERTION;  Surgeon: Erline Levine, MD;  Location: [place] NEURO ORS;  Service: Neurosurgery;  Laterality: Bilateral;  Bilateral deep brain stimulator placement    There were no vitals filed for this visit.   Subjective Assessment - 05/20/20 0848    Patient is accompanied by: Family member    Pertinent History Pt. is a 70 yo male who has a history of Parkinson's Disease. Pt. has a Deep Brain Stimulator in place. Pt. has a history of Low Back Pain with 2 back surgeries in August 2014, and February 2016. Pt. has a history of multiple frequent falls, unsure of how many falls in the last 6 months.  Limitations Frequent falls, limited motor control, and [place].     Currently in Pain? No/denies    Pain Score 3     Pain Location Back    Pain Descriptors / Indicators Aching    Pain Onset More than a month ago          OT Treatment  Therapeutic Exercise:  Pt. performed3 # dowel ex.for UE strengthening secondary to weakness. Bilateral shoulder flexion, chest press,andcircular patterns were performed1 set of 10- 20reps each.Pt. Performed2# dumbbell ex.for elbow flexion, extension,forearm supination/pronation, wrist  flexion/extension.Pt. requires rest breaks and verbal cues, tactile cues, and cues for visual demonstrationfor proper technique.Pt. required increased time to complete. Pt. Worked on shoulder flexion, and horizontal abduction with yellow theraband.  Pt.'s wife reports that the patient is having a hard time waking up today.Pt.requiredincreased time,verbal, tactile, and visual cues for proper there.ex. form, and pace during the exercises. Pt. continues to work on improving UE strength, [facility] skill in order to work towards improving, and maximizing independence with ADLs, and IADLs.                        OT Education - 05/20/20 [phone]    Education provided Yes    Education Details A/E use, left hand strength    Person(s) Educated Patient;Spouse    Methods Explanation    Comprehension Verbalized understanding               OT Long Term Goals - 03/18/20 1355      OT LONG TERM GOAL #1   Title Pt. will improve Bilateral UE strength by 1 mm grade to assist with ADLs, and IADLs    Baseline BUE strength 4/5 overall at eval    Time 12    Period Weeks    Status On-going    Target Date 06/01/20      OT LONG TERM GOAL #2   Title Pt. will improve grip strength by 10# to open jars and containers.    Baseline Pt. conitnues to have difficulty opening jars    Time 12    Period Weeks    Target Date 06/01/20      OT LONG TERM GOAL #3   Title Patient will complete lower body dressing with modified independence.    Baseline min to mod assist at eval; sock aid supervision, Mod A doff R sock, IND doff L    Time 12    Period Weeks    Status Partially Met    Target Date 06/01/20      OT LONG TERM GOAL #4   Title Patient to demonstrate the ability to vacuum flooring at home with use of modified techniques with supervision.    Baseline Pt. continues to hav difficulty    Time 12    Period Weeks    Status On-going    Target Date 06/01/20      OT LONG TERM GOAL #5    Title Patient will demonstrate the ability to retrieve a snack and transport it to the living room with modified independence.    Baseline Pt. conitnues to work on transporting items    Time 12    Period Weeks    Status On-going    Target Date 06/11/20      OT LONG TERM GOAL #6   Title Patient will improve hand function to be able to perform cutting of food with modified independence.    Baseline Pt. is improving, however continues  to have difficulty    Time 12    Period Weeks    Status Partially Met    Target Date 06/01/20      OT LONG TERM GOAL #7   Title Pt. will write a paragraph efficiently with 90% legibility.    Baseline Limited writing legibility and poor hand endurance for writing more than 1 sentence.    Time 12    Period Weeks    Status On-going    Target Date 06/01/20      OT LONG TERM GOAL #8   Title Pt. will improve typing speed, and accuracy to be able to to type emails efficently    Baseline Continues to be limited    Time 12    Period Weeks    Status On-going    Target Date 06/01/20      OT LONG TERM GOAL  #9   Baseline Pt will transfers in and out of the shower with stand-by assistance    Time 12    Period Weeks    Status On-going    Target Date 06/01/20      OT LONG TERM GOAL  #10   TITLE Pt will don and doff pants with supervision    Baseline min assist to manage pants    Time 12    Period Weeks    Status On-going    Target Date 06/01/20                 Plan - 05/20/20 1091    Clinical Impression Statement Pt.'s wife reports that the patient is having a hard time waking up today.Pt.requiredincreased time,verbal, tactile, and visual cues for proper there.ex. form, and pace during the exercises. Pt. continues to work on improving UE strength, [facility] skill in order to work towards improving, and maximizing independence with ADLs, and IADLs.   OT Occupational Profile and History Detailed Assessment- Review of Records and additional review  of physical, cognitive, psychosocial history related to current functional performance    Occupational performance deficits (Please refer to evaluation for details): ADL's;IADL's;Leisure    Body Structure / Function / Physical Skills ADL;Coordination;Endurance;GMC;UE functional use;Balance;Decreased knowledge of use of DME;IADL;Pain;Dexterity;FMC;Strength;Mobility;ROM    Cognitive Skills Attention;Problem Solve;Memory    Psychosocial Skills Coping Strategies;Environmental  Adaptations;Habits;Routines and Behaviors    Rehab Potential Good    Clinical Decision Making Limited treatment options, no task modification necessary    Comorbidities Affecting Occupational Performance: Presence of comorbidities impacting occupational performance    Comorbidities impacting occupational performance description: risk for falls, slow to respond and process information at times, safety    Modification or Assistance to Complete Evaluation  No modification of tasks or assist necessary to complete eval    OT Frequency 2x / week    OT Duration 2 weeks    OT Treatment/Interventions Self-care/ADL training;Therapeutic exercise;DME and/or AE instruction;Energy conservation;Neuromuscular education;Patient/family education;Therapeutic activities;Moist Heat;Functional Mobility Training;Cognitive remediation/compensation    Consulted and Agree with Plan of Care Patient           Patient will benefit from skilled therapeutic intervention in order to improve the following deficits and impairments:   Body Structure / Function / Physical Skills: ADL,Coordination,Endurance,GMC,UE functional use,Balance,Decreased knowledge of use of DME,IADL,Pain,Dexterity,FMC,Strength,Mobility,ROM Cognitive Skills: Attention,Problem Solve,Memory Psychosocial Skills: Coping Strategies,Environmental  Adaptations,Habits,Routines and Behaviors   Visit Diagnosis: Muscle weakness (generalized)  Other lack of coordination    Problem  List Patient Active Problem List   Diagnosis Date Noted  . Vertigo 02/19/2020  . Urine abnormality  02/19/2020  . E. coli bacteremia 02/23/2019  . Dysphagia 02/23/2019  . Aspiration pneumonia ([place]) 02/21/2019  . Closed right hip fracture ([place]) 02/16/2019  . Rib pain on right side 09/25/2018  . Skin sore 12/01/2017  . Hematoma 10/18/2017  . Fall at home 10/18/2017  . 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 Radicular pain in right arm 10/09/2016  . GERD (gastroesophageal reflux disease) 07/31/2016  . Trochanteric bursitis 03/03/2016  . Left hand pain 10/30/2015  . Fracture, finger, multiple sites 10/30/2015  . Colon cancer screening 07/23/2015  . Healthcare maintenance 07/23/2015  . Gout 02/18/2015  . Depression 01/06/2015  . Irritation of eyelid 08/20/2014  . Dupuytren's contracture 08/20/2014  . Cough 07/21/2014  . Lumbar stenosis with neurogenic claudication 06/13/2014  . Preop cardiovascular exam 05/28/2014  . S/P deep brain stimulator placement 05/08/2014  . Joint pain 01/22/2014  . Medicare annual wellness visit, initial 01/19/2014  . Advance care planning 01/19/2014  . Parkinson's disease ([place]) 12/13/2013  . PTSD (post-traumatic stress disorder) 06/13/2013  . Erectile dysfunction 06/13/2013  . HLD (hyperlipidemia) 06/13/2013  . Hip pain 06/10/2013  . Pain in joint, shoulder region 06/10/2013  . Right leg swelling 06/03/2013  . Essential hypertension 06/03/2013  . Bradycardia by electrocardiogram 06/03/2013  . Obstructive sleep apnea 03/12/2013  . Spinal stenosis, lumbar region, with neurogenic claudication 12/14/2012    Harrel Carina, MS, OTR/L 05/20/2020, 8:54 AM  [place] MAIN [facility], [facility] SERVICES [address] [place], [place], [address] Phone: [phone]   Fax:  [phone]  Name: [name] MRN: [id number] Date of Birth: 03-16-1951

## 2020-05-20 NOTE — Therapy (Signed)
[place] MAIN [facility] SERVICES [address] [place], [place], [address] Phone: [phone]   Fax:  [phone]  Physical Therapy Treatment  Patient Details  Name: [name] MRN: [id number] Date of Birth: [birth year] Referring Provider (PT): Sharolyn Douglas MD    Encounter Date: 05/20/2020   PT End of Session - 05/20/20 1548    Visit Number 45    Number of Visits 56    Date for PT Re-Evaluation 06/03/20    Authorization Time Period FOTO- PT    PT Start Time 0931    PT Stop Time 1015    PT Time Calculation (min) 44 min    Equipment Utilized During Treatment Gait belt    Activity Tolerance Patient tolerated treatment well;No increased pain;Patient limited by fatigue    Behavior During Therapy [facility] for tasks assessed/performed           Past Medical History:  Diagnosis Date  . Arthritis   . Bradycardia   . Cancer [facility]) 2013   skin cancer  . Depression    ptsd  . Dysrhythmia    chronic slow heart rate  . GERD (gastroesophageal reflux disease)   . Headache(784.0)    tension headaches non recent  . History of chicken pox   . History of kidney stones    passed  . Hypertension    treated with HCTZ  . Parkinson's disease ([place])    dx'ed 15 years ago  . PTSD (post-traumatic stress disorder)   . Shortness of breath dyspnea   . Sleep apnea    doesn't use C-pap  . Varicose veins     Past Surgical History:  Procedure Laterality Date  . CHOLECYSTECTOMY N/A 10/22/2014   Procedure: LAPAROSCOPIC CHOLECYSTECTOMY WITH INTRAOPERATIVE CHOLANGIOGRAM;  Surgeon: Dia Crawford III, MD;  Location: [facility] ORS;  Service: General;  Laterality: N/A;  . cyst removed      from lip as a child  . INTRAMEDULLARY (IM) NAIL INTERTROCHANTERIC N/A 02/18/2019   Procedure: INTRAMEDULLARY (IM) NAIL INTERTROCHANTRIC, RIGHT,;  Surgeon: Thornton Park, MD;  Location: [facility] ORS;  Service: Orthopedics;  Laterality: N/A;  . LUMBAR LAMINECTOMY/DECOMPRESSION MICRODISCECTOMY  Bilateral 12/14/2012   Procedure: Bilateral lumbar three-four, four-five decompressive laminotomy/foraminotomy;  Surgeon: Charlie Pitter, MD;  Location: [place] NEURO ORS;  Service: Neurosurgery;  Laterality: Bilateral;  . PULSE GENERATOR IMPLANT Bilateral 12/13/2013   Procedure: Bilateral implantable pulse generator placement;  Surgeon: Erline Levine, MD;  Location: [place] NEURO ORS;  Service: Neurosurgery;  Laterality: Bilateral;  Bilateral implantable pulse generator placement  . skin cancer removed     from ears,   12 lft arm  rt leg 15  . SUBTHALAMIC STIMULATOR BATTERY REPLACEMENT Bilateral 07/14/2017   Procedure: BILATERAL IMPLANTED PULSE GENERATOR CHANGE FOR DEEP BRAIN STIMULATOR;  Surgeon: Erline Levine, MD;  Location: [place];  Service: Neurosurgery;  Laterality: Bilateral;  . SUBTHALAMIC STIMULATOR INSERTION Bilateral 12/06/2013   Procedure: SUBTHALAMIC STIMULATOR INSERTION;  Surgeon: Erline Levine, MD;  Location: [place] NEURO ORS;  Service: Neurosurgery;  Laterality: Bilateral;  Bilateral deep brain stimulator placement    There were no vitals filed for this visit.   Subjective Assessment - 05/20/20 1005    Subjective Patient's caregiver reports 12 days without fall. Reports no falls in 12 days. Was able to go to an event in [place] and stay the night.    Pertinent History [name] is a 70 y.o. male with a hx of HTN, bradycardia, Parkinson's s/p deep brain stimulator 11/2013 ,  HLD, depression, possible seizure disorder, arthritis, depression/PTSD, dysrhythmia, GERD, sleep apnea. Patient was seen by this therapist in years past. Patient had a fall around Halloween of 2020 and admitted for ORIF on 02/18/19, was discharged to SNF and had [place] around Christmas. Had home health therapy for a month and a half. After that tried to go to Outpatient Therapy for 3 sessions at [facility] and was not pleased due to how busy and crowded the location was. Fell last week in bathroom, didn't take walker in, falls about 2x  month. Is very weak in RLE. Patient has a new lift chair and U drive walking. Has a caregiver 4 days a week. Has additional order for radiculopathy, lumbar region. PMH of lumbar laminectomy/decompression microdiscectomy (L3-4, 4-5)  In 2014.  Lumbar 4-5 cage +rod (2016).    Limitations Walking;Standing;Sitting;House hold activities;Lifting    How long can you sit comfortably? not limited with a backrest, without a back rest 3 minutes    How long can you stand comfortably? 3-5 minutes with holding on.    How long can you walk comfortably? with U step 6 minutes.    Patient Stated Goals pain reduction of R hip. strength of legs, dance with wife. push lawnmower.    Currently in Pain? No/denies             BP at start of session: seated 150/64 standing: 123/59         Supine:  Contract relax RLE pressing into PT shoulder 15x 3 second holds Bridge 15x  BTB abduction 20x  Green swiss ball  -TrA contraction 15x 3 second holds pressing into ball with knees and hands -TrA contraction with alternating UE raise 10x each UE    3lb ankle weight: -SLR 10x each LE -knee flexion/extension 15x each LE -hip abduction 10x each LE    Seated: 3lb ankle weight - march 15x each LE - LAQ 15x each LE -alternating ER with GTB between knees 15x each LE    Sit to stand 10x with cues for reduction of eccentric plop  Sit to stand throw ball at target 15x  Balloon taps reaching inside/outside BOS x 4 minutes for coordination spatial awareness and sequencing      Pt educated throughout session about proper posture and technique with exercises. Improved exercise technique, movement at target joints, use of target muscles after min to mod verbal, visual, tactile cues.   Vitals monitored to ensure therapeutic values with rest breaks as needed                  PT Education - 05/20/20 1005    Education provided Yes    Education Details exercise technique, body mechanics    Person(s)  Educated Patient    Methods Explanation;Demonstration;Tactile cues    Comprehension Verbalized understanding;Returned demonstration;Verbal cues required;Tactile cues required            PT Short Term Goals - 01/08/20 1024      PT SHORT TERM GOAL #1   Title Pt will perform HEP with family's/aide's supervision, for improved balance, transfers, and gait.    Baseline 5/5: HEP given 6/28: HEP compliant 8/23: aide helps with HEP    Time 4    Period Weeks    Status Achieved    Target Date 09/19/19             PT Long Term Goals - 04/27/20 1259      PT LONG TERM GOAL #1   Title  Patient will increase Berg Balance score by > 6 points (54/56) to demonstrate decreased fall risk during functional activities.    Baseline 10/20: 48/56 11/10: 49/56 12/22: 45/56 1/10: 39/56    Time 8    Period Weeks    Status Partially Met    Target Date 06/03/20      PT LONG TERM GOAL #2   Title Patient will ambulate within his household for ~ 5 minutes LRAD with no episodes of falling to increase independence with household mobility    Baseline 10/20: requires use of rollator to ambulate 11/10: requires AD 12/22: deferred due to recent increase in falls 1/10 deferred due to falls    Time 8    Period Weeks    Status Deferred    Target Date 06/03/20      PT LONG TERM GOAL #4   Title Patient will increase FOTO score to equal to or greater than 31/100  to demonstrate statistically significant improvement in mobility and quality of life.    Baseline 5/5: 12/100 6/28: 38% 8/23: 11.7 9/22: 45% 10/20: 47.9% 11/10: 40.2%; 04/01/20: 40% 1/10: 40.3%    Time 8    Period Weeks    Status Achieved      PT LONG TERM GOAL #5   Title Patient will increase six minute walk test distance to >1000 for progression to community ambulator and improve gait ability    Baseline 6/28: 750 ft with U step rollator 8/23: 775 w rolling walker, increased crouch pattern of ambulation 9/22: deferred due to pain; 10/18: 853ft 11/10:  770 ft 12/22: 695 ft with rolator 1/10: 902 ft; L foot drag    Time 8    Period Weeks    Status Partially Met    Target Date 06/03/20      PT LONG TERM GOAL #6   Title Patient will increase Berg Balance score by > 6 points (42/56) to demonstrate decreased fall risk during functional activities.    Baseline 6/28: 36/56 8/23: 42/56 9/22 44/56 10/20: 48/56    Time 8    Period Weeks    Status Achieved      PT LONG TERM GOAL #7   Title Patient will report a worst pain of 3/10 on VAS in low back to improve tolerance with ADLs and reduced symptoms with activities.    Baseline 8/9: 9/10 8/23: 7/10 pain 9/22: 7/10 10/20: 5/10 11/10:  one episode of 9/10 when fell  the rest 4-6/10 12/22: 4/10    Time 8    Period Weeks    Status Partially Met      PT LONG TERM GOAL #8   Title Patient will reduce modified Oswestry score to <20 as to demonstrate minimal disability with ADLs including improved sleeping tolerance, walking/sitting tolerance etc for better mobility with ADLs.    Baseline 8/9: 64% 8/23: 66% 9/22:44% 10/20: 40% 11/20: 54% 12/22: 52% 1/10:  56%    Time 8    Period Weeks    Status Partially Met    Target Date 06/03/20                 Plan - 05/20/20 1548    Clinical Impression Statement Patient demonstrates excellent motivation throughout physical therapy session despite fatigue. Seated interventions tolerated well despite orthostatic hypotension. Patient requires max cueing for knee extension and glute squeeze in standing position. Patient would benefit from skilled physical therapy to increase strength, stability, balance, and decrease pain to improve quality of life  Personal Factors and Comorbidities Age;Comorbidity 3+;Past/Current Experience;Time since onset of injury/illness/exacerbation;Transportation    Comorbidities athritis, bradycardia, cancer, depression, dysrhythmia, GERD, HTN, Parkinson's, PTSD, SOB, sleep apnea    Examination-Activity Limitations Bathing;Bed  Mobility;Caring for Others;Bend;Dressing;Lift;Locomotion Level;Reach Overhead;Sit;Squat;Stairs;Stand;Toileting;Transfers    Examination-Participation Restrictions Church;Cleaning;Community Activity;Interpersonal Relationship;Shop;Volunteer;Yard Work;Other    Stability/Clinical Decision Making Stable/Uncomplicated    Rehab Potential Fair    PT Frequency 2x / week    PT Duration 8 weeks    PT Treatment/Interventions ADLs/Self Care Home Management;Aquatic Therapy;Cryotherapy;Electrical Stimulation;Iontophoresis 4mg /ml Dexamethasone;Moist Heat;Ultrasound;Contrast Bath;DME Instruction;Gait training;Stair training;Functional mobility training;Therapeutic activities;Therapeutic exercise;Balance training;Neuromuscular re-education;Patient/family education;Manual techniques;Wheelchair mobility training;Energy conservation;Passive range of motion;Traction;Orthotic Fit/Training;Dry needling;Vestibular;Visual/perceptual remediation/compensation;Taping;Canalith Repostioning    PT Next Visit Plan core and pain relief    PT Home Exercise Plan [place] and Agree with Plan of Care Patient;Family member/caregiver    Family Member Consulted wife           Patient will benefit from skilled therapeutic intervention in order to improve the following deficits and impairments:  Abnormal gait,Decreased balance,Decreased coordination,Decreased mobility,Impaired tone,Postural dysfunction,Decreased strength,Decreased safety awareness,Improper body mechanics,Impaired flexibility,Decreased activity tolerance,Decreased endurance,Decreased knowledge of precautions,Difficulty walking,Pain,Cardiopulmonary status limiting activity,Decreased range of motion,Impaired perceived functional ability  Visit Diagnosis: Muscle weakness (generalized)  Other lack of coordination  Parkinson's disease ([place])  Unsteadiness on feet     Problem List Patient Active Problem List   Diagnosis Date Noted  . Vertigo 02/19/2020   . Urine abnormality 02/19/2020  . E. coli bacteremia 02/23/2019  . Dysphagia 02/23/2019  . Aspiration pneumonia ([place]) 02/21/2019  . Closed right hip fracture ([place]) 02/16/2019  . Rib pain on right side 09/25/2018  . Skin sore 12/01/2017  . Hematoma 10/18/2017  . Fall at home 10/18/2017  . 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 Radicular pain in right arm 10/09/2016  . GERD (gastroesophageal reflux disease) 07/31/2016  . Trochanteric bursitis 03/03/2016  . Left hand pain 10/30/2015  . Fracture, finger, multiple sites 10/30/2015  . Colon cancer screening 07/23/2015  . Healthcare maintenance 07/23/2015  . Gout 02/18/2015  . Depression 01/06/2015  . Irritation of eyelid 08/20/2014  . Dupuytren's contracture 08/20/2014  . Cough 07/21/2014  . Lumbar stenosis with neurogenic claudication 06/13/2014  . Preop cardiovascular exam 05/28/2014  . S/P deep brain stimulator placement 05/08/2014  . Joint pain 01/22/2014  . Medicare annual wellness visit, initial 01/19/2014  . Advance care planning 01/19/2014  . Parkinson's disease ([place]) 12/13/2013  . PTSD (post-traumatic stress disorder) 06/13/2013  . Erectile dysfunction 06/13/2013  . HLD (hyperlipidemia) 06/13/2013  . Hip pain 06/10/2013  . Pain in joint, shoulder region 06/10/2013  . Right leg swelling 06/03/2013  . Essential hypertension 06/03/2013  . Bradycardia by electrocardiogram 06/03/2013  . Obstructive sleep apnea 03/12/2013  . Spinal stenosis, lumbar region, with neurogenic claudication 12/14/2012   Janna Arch, PT, DPT   05/20/2020, 3:49 PM  [place] MAIN [facility] SERVICES [address] [place], [place], [address] Phone: [phone]   Fax:  [phone]  Name: [name] MRN: [id number] Date of Birth: July 27, 1950

## 2020-05-20 NOTE — Telephone Encounter (Signed)
Spoke with patients wife.  Patient wife stated that she does not think she placed his ZIO monitor the correct way and that it is already falling off.  Patient wife stated that Kinta is currently in OT then has to go to PT.   I made wife aware since they are already at the hospital to come on over after patient has completed his OT and PT and I would take a look at the monitor and would place a new one if needed.   She was very appreciative of the call.

## 2020-05-20 NOTE — Telephone Encounter (Signed)
See message below. Thanks

## 2020-05-22 ENCOUNTER — Ambulatory Visit: Payer: Medicare PPO | Admitting: Cardiology

## 2020-05-22 DIAGNOSIS — R9401 Abnormal electroencephalogram [EEG]: Secondary | ICD-10-CM | POA: Diagnosis not present

## 2020-05-22 DIAGNOSIS — G20 Parkinson's disease: Secondary | ICD-10-CM | POA: Diagnosis not present

## 2020-05-22 DIAGNOSIS — G4752 REM sleep behavior disorder: Secondary | ICD-10-CM | POA: Diagnosis not present

## 2020-05-22 DIAGNOSIS — Z9689 Presence of other specified functional implants: Secondary | ICD-10-CM | POA: Diagnosis not present

## 2020-05-22 DIAGNOSIS — R4689 Other symptoms and signs involving appearance and behavior: Secondary | ICD-10-CM | POA: Diagnosis not present

## 2020-05-25 ENCOUNTER — Ambulatory Visit: Payer: Medicare PPO

## 2020-05-25 ENCOUNTER — Ambulatory Visit: Payer: Medicare PPO | Admitting: Occupational Therapy

## 2020-05-25 ENCOUNTER — Ambulatory Visit (INDEPENDENT_AMBULATORY_CARE_PROVIDER_SITE_OTHER): Payer: Medicare PPO | Admitting: Psychology

## 2020-05-25 DIAGNOSIS — F4323 Adjustment disorder with mixed anxiety and depressed mood: Secondary | ICD-10-CM | POA: Diagnosis not present

## 2020-05-25 DIAGNOSIS — F331 Major depressive disorder, recurrent, moderate: Secondary | ICD-10-CM

## 2020-05-27 ENCOUNTER — Ambulatory Visit: Payer: Medicare PPO | Admitting: Occupational Therapy

## 2020-05-27 ENCOUNTER — Ambulatory Visit: Payer: Medicare PPO

## 2020-06-01 ENCOUNTER — Ambulatory Visit: Payer: Medicare PPO | Admitting: Occupational Therapy

## 2020-06-01 ENCOUNTER — Encounter: Payer: Self-pay | Admitting: Occupational Therapy

## 2020-06-01 ENCOUNTER — Ambulatory Visit: Payer: Medicare PPO

## 2020-06-01 ENCOUNTER — Other Ambulatory Visit: Payer: Self-pay

## 2020-06-01 DIAGNOSIS — R2681 Unsteadiness on feet: Secondary | ICD-10-CM | POA: Diagnosis not present

## 2020-06-01 DIAGNOSIS — G20 Parkinson's disease: Secondary | ICD-10-CM

## 2020-06-01 DIAGNOSIS — M6281 Muscle weakness (generalized): Secondary | ICD-10-CM

## 2020-06-01 DIAGNOSIS — R278 Other lack of coordination: Secondary | ICD-10-CM

## 2020-06-01 NOTE — Therapy (Signed)
[facility] [address] [place], [place], [address] Phone: [phone]   Fax:  [phone]  Occupational Therapy Treatment  Patient Details  Name: [name] MRN: [id number] Date of Birth: 05/29/1950 No data recorded  Encounter Date: 06/01/2020   OT End of Session - 06/01/20 1028    Visit Number 29    Number of Visits 48    Date for OT Re-Evaluation 06/01/20    Authorization Type reporting period starting 02/05/2020    OT Start Time 1015    OT Stop Time 1100    OT Time Calculation (min) 45 min    Activity Tolerance Patient tolerated treatment well    Behavior During Therapy WFL for tasks assessed/performed           Past Medical History:  Diagnosis Date  . Arthritis   . Bradycardia   . Cancer (HCC) 2013   skin cancer  . Depression    ptsd  . Dysrhythmia    chronic slow heart rate  . GERD (gastroesophageal reflux disease)   . Headache(784.0)    tension headaches non recent  . History of chicken pox   . History of kidney stones    passed  . Hypertension    treated with HCTZ  . Parkinson's disease (HCC)    dx'ed 15 years ago  . PTSD (post-traumatic stress disorder)   . Shortness of breath dyspnea   . Sleep apnea    doesn't use C-pap  . Varicose veins     Past Surgical History:  Procedure Laterality Date  . CHOLECYSTECTOMY N/A 10/22/2014   Procedure: LAPAROSCOPIC CHOLECYSTECTOMY WITH INTRAOPERATIVE CHOLANGIOGRAM;  Surgeon: Ralph Ely III, MD;  Location: [facility] ORS;  Service: General;  Laterality: N/A;  . cyst removed      from lip as a child  . INTRAMEDULLARY (IM) NAIL INTERTROCHANTERIC N/A 02/18/2019   Procedure: INTRAMEDULLARY (IM) NAIL INTERTROCHANTRIC, RIGHT,;  Surgeon: Krasinski, Kevin, MD;  Location: [facility] OR[facility];  Service: Orthopedics;  Laterality: N/A;  . LUMBAR LAMINECTOMY/DECOMPRESSION MICRODISCECTOMY Bilateral 12/14/2012   Procedure: Bilateral lumbar three-four, four-five decompressive  laminotomy/foraminotomy;  Surgeon: Henry A Pool, MD;  Location: [facility];  Service: Neurosurgery;  Laterality: Bilateral;  . PULSE GENERATOR IMPLANT Bilateral 12/13/2013   Procedure: Bilateral implantable pulse generator placement;  Surgeon: Joseph Stern, MD;  Location: [facility];  Service: Neurosurgery;  Laterality: Bilateral;  Bilateral implantable pulse generator placement  . skin cancer removed     from ears,   12 lft arm  rt leg 15  . SUBTHALAMIC STIMULATOR BATTERY REPLACEMENT Bilateral 07/14/2017   Procedure: BILATERAL IMPLANTED PULSE GENERATOR CHANGE FOR DEEP BRAIN STIMULATOR;  Surgeon: Stern, Joseph, MD;  Location: [facility];  Service: Neurosurgery;  Laterality: Bilateral;  . SUBTHALAMIC STIMULATOR INSERTION Bilateral 12/06/2013   Procedure: SUBTHALAMIC STIMULATOR INSERTION;  Surgeon: Joseph Stern, MD;  Location: [facility];  Service: Neurosurgery;  Laterality: Bilateral;  Bilateral deep brain stimulator placement    There were no vitals filed for this visit.   Subjective Assessment - 06/01/20 1027    Patient is accompanied by: Family member    Pertinent History Pt. is a 70 yo male who has a history of Parkinson's Disease. Pt. has a Deep Brain Stimulator in place. Pt. has a history of Low Back Pain with 2 back surgeries in August 2014, and February 2016. Pt. has a history of multiple frequent falls, unsure of how many falls in the last 6 months.      Limitations Frequent falls, limited motor control, and FMC.     Patient Stated Goals Pt would like to be more independent daily.    Currently in Pain? No/denies         OT TREATMENT    Neuro muscular re-education:  Pt. worked on using his right hand for grasping, flipping, and turning [place] style discs, Pt. completed 4 rows of 15 for 60 total.  Pt. required visual demonstration, and cues for movement patterns. Pt. worked on speed, and coordination skills.   Pt., and his wife report no falls this past week. Pt. has difficulty  getting down his ramp outside in the inclement weather. Pt. required cues, and increased time to complete the tasks with his right, and left hands. [facility]. Pt. Continues to to work on improving FMC skills in order to work towards improving, and maximizing independence with ADLs, and IADLs.                         OT Education - 06/01/20 1028    Education provided Yes    Education Details A/E use, left hand strength    Person(s) Educated Patient;Spouse    Methods Explanation    Comprehension Verbalized understanding               OT Long Term Goals - 03/18/20 1355      OT LONG TERM GOAL #1   Title Pt. will improve Bilateral UE strength by 1 mm grade to assist with ADLs, and IADLs    Baseline BUE strength 4/5 overall at eval    Time 12    Period Weeks    Status On-going    Target Date 06/01/20      OT LONG TERM GOAL #2   Title Pt. will improve grip strength by 10# to open jars and containers.    Baseline Pt. conitnues to have difficulty opening jars    Time 12    Period Weeks    Target Date 06/01/20      OT LONG TERM GOAL #3   Title Patient will complete lower body dressing with modified independence.    Baseline min to mod assist at eval; sock aid supervision, Mod A doff R sock, IND doff L    Time 12    Period Weeks    Status Partially Met    Target Date 06/01/20      OT LONG TERM GOAL #4   Title Patient to demonstrate the ability to vacuum flooring at home with use of modified techniques with supervision.    Baseline Pt. continues to hav difficulty    Time 12    Period Weeks    Status On-going    Target Date 06/01/20      OT LONG TERM GOAL #5   Title Patient will demonstrate the ability to retrieve a snack and transport it to the living room with modified independence.    Baseline Pt. conitnues to work on transporting items    Time 12    Period Weeks    Status On-going    Target Date 06/11/20       OT LONG TERM GOAL #6   Title Patient will improve hand function to be able to perform cutting of food with modified independence.    Baseline Pt. is improving, however continues to have difficulty    Time 12    Period Weeks      Status Partially Met    Target Date 06/01/20      OT LONG TERM GOAL #7   Title Pt. will write a paragraph efficiently with 90% legibility.    Baseline Limited writing legibility and poor hand endurance for writing more than 1 sentence.    Time 12    Period Weeks    Status On-going    Target Date 06/01/20      OT LONG TERM GOAL #8   Title Pt. will improve typing speed, and accuracy to be able to to type emails efficently    Baseline Continues to be limited    Time 12    Period Weeks    Status On-going    Target Date 06/01/20      OT LONG TERM GOAL  #9   Baseline Pt will transfers in and out of the shower with stand-by assistance    Time 12    Period Weeks    Status On-going    Target Date 06/01/20      OT LONG TERM GOAL  #10   TITLE Pt will don and doff pants with supervision    Baseline min assist to manage pants    Time 12    Period Weeks    Status On-going    Target Date 06/01/20                 Plan - 06/01/20 1029    Clinical Impression Statement Pt., and his wife report no falls this past week. Pt. has difficulty getting down his ramp outside in the inclement weather. Pt. required cues, and increased time to complete the tasks with his right, and left hands. [facility]. Pt. Continues to to work on improving FMC skills in order to work towards improving, and maximizing independence with ADLs, and IADLs.   OT Occupational Profile and History Detailed Assessment- Review of Records and additional review of physical, cognitive, psychosocial history related to current functional performance    Occupational performance deficits (Please refer to evaluation for details): ADL's;IADL's;Leisure     Body Structure / Function / Physical Skills ADL;Coordination;Endurance;GMC;UE functional use;Balance;Decreased knowledge of use of DME;IADL;Pain;Dexterity;FMC;Strength;Mobility;ROM    Cognitive Skills Attention;Problem Solve;Memory    Psychosocial Skills Coping Strategies;Environmental  Adaptations;Habits;Routines and Behaviors    Rehab Potential Good    Clinical Decision Making Limited treatment options, no task modification necessary    Comorbidities Affecting Occupational Performance: Presence of comorbidities impacting occupational performance    Comorbidities impacting occupational performance description: risk for falls, slow to respond and process information at times, safety    Modification or Assistance to Complete Evaluation  No modification of tasks or assist necessary to complete eval    OT Frequency 2x / week    OT Duration 2 weeks    OT Treatment/Interventions Self-care/ADL training;Therapeutic exercise;DME and/or AE instruction;Energy conservation;Neuromuscular education;Patient/family education;Therapeutic activities;Moist Heat;Functional Mobility Training;Cognitive remediation/compensation    Consulted and Agree with Plan of Care Patient           Patient will benefit from skilled therapeutic intervention in order to improve the following deficits and impairments:   Body Structure / Function / Physical Skills: ADL,Coordination,Endurance,GMC,UE functional use,Balance,Decreased knowledge of use of DME,IADL,Pain,Dexterity,FMC,Strength,Mobility,ROM Cognitive Skills: Attention,Problem Solve,Memory Psychosocial Skills: Coping Strategies,Environmental  Adaptations,Habits,Routines and Behaviors   Visit Diagnosis: Muscle weakness (generalized)  Other lack of coordination    Problem List Patient Active Problem List   Diagnosis Date Noted  . Vertigo 02/19/2020  .   Urine abnormality 02/19/2020  . E. coli bacteremia 02/23/2019  . Dysphagia 02/23/2019  . Aspiration pneumonia  ([place]) 02/21/2019  . Closed right hip fracture ([place]) 02/16/2019  . Rib pain on right side 09/25/2018  . Skin sore 12/01/2017  . Hematoma 10/18/2017  . Fall at home 10/18/2017  . REM behavioral disorder 11/02/2016  . Radicular pain in right arm 10/09/2016  . GERD (gastroesophageal reflux disease) 07/31/2016  . Trochanteric bursitis 03/03/2016  . Left hand pain 10/30/2015  . Fracture, finger, multiple sites 10/30/2015  . Colon cancer screening 07/23/2015  . Healthcare maintenance 07/23/2015  . Gout 02/18/2015  . Depression 01/06/2015  . Irritation of eyelid 08/20/2014  . Dupuytren's contracture 08/20/2014  . Cough 07/21/2014  . Lumbar stenosis with neurogenic claudication 06/13/2014  . Preop cardiovascular exam 05/28/2014  . S/P deep brain stimulator placement 05/08/2014  . Joint pain 01/22/2014  . Medicare annual wellness visit, initial 01/19/2014  . Advance care planning 01/19/2014  . Parkinson's disease ([place]) 12/13/2013  . PTSD (post-traumatic stress disorder) 06/13/2013  . Erectile dysfunction 06/13/2013  . HLD (hyperlipidemia) 06/13/2013  . Hip pain 06/10/2013  . Pain in joint, shoulder region 06/10/2013  . Right leg swelling 06/03/2013  . Essential hypertension 06/03/2013  . Bradycardia by electrocardiogram 06/03/2013  . Obstructive sleep apnea 03/12/2013  . Spinal stenosis, lumbar region, with neurogenic claudication 12/14/2012    Harrel Carina, MS, OTR/L 06/01/2020, 10:32 AM  [facility] [place], [place], [address] Phone: [phone]   Fax:  [phone]  Name: BROCK LARMON MRN: [id number] Date of Birth: [birth year]

## 2020-06-01 NOTE — Therapy (Signed)
[place] MAIN [facility] [address] [place], [place], [address] Phone: [phone]   Fax:  [phone]  Physical Therapy Treatment  Patient Details  Name: [name] MRN: [id number] Date of Birth: 1950/04/26 Referring Provider (PT): Sharolyn Douglas MD    Encounter Date: 06/01/2020   PT End of Session - 06/01/20 1927    Visit Number 46    Number of Visits 56    Date for PT Re-Evaluation 06/03/20    Authorization Time Period FOTO- PT    PT Start Time 0930    PT Stop Time 1013    PT Time Calculation (min) 43 min    Equipment Utilized During Treatment Gait belt    Activity Tolerance Patient tolerated treatment well;No increased pain;Patient limited by fatigue    Behavior During Therapy [facility] for tasks assessed/performed           Past Medical History:  Diagnosis Date  . Arthritis   . Bradycardia   . Cancer [facility]) 2013   skin cancer  . Depression    ptsd  . Dysrhythmia    chronic slow heart rate  . GERD (gastroesophageal reflux disease)   . Headache(784.0)    tension headaches non recent  . History of chicken pox   . History of kidney stones    passed  . Hypertension    treated with HCTZ  . Parkinson's disease ([place])    dx'ed 15 years ago  . PTSD (post-traumatic stress disorder)   . Shortness of breath dyspnea   . Sleep apnea    doesn't use C-pap  . Varicose veins     Past Surgical History:  Procedure Laterality Date  . CHOLECYSTECTOMY N/A 10/22/2014   Procedure: LAPAROSCOPIC CHOLECYSTECTOMY WITH INTRAOPERATIVE CHOLANGIOGRAM;  Surgeon: Dia Crawford III, MD;  Location: [facility] ORS;  Service: General;  Laterality: N/A;  . cyst removed      from lip as a child  . INTRAMEDULLARY (IM) NAIL INTERTROCHANTERIC N/A 02/18/2019   Procedure: INTRAMEDULLARY (IM) NAIL INTERTROCHANTRIC, RIGHT,;  Surgeon: Thornton Park, MD;  Location: [facility] ORS;  Service: Orthopedics;  Laterality: N/A;  . LUMBAR LAMINECTOMY/DECOMPRESSION MICRODISCECTOMY  Bilateral 12/14/2012   Procedure: Bilateral lumbar three-four, four-five decompressive laminotomy/foraminotomy;  Surgeon: Charlie Pitter, MD;  Location: [place] [facility]URO ORS;  Service: Neurosurgery;  Laterality: Bilateral;  . PULSE GENERATOR IMPLANT Bilateral 12/13/2013   Procedure: Bilateral implantable pulse generator placement;  Surgeon: Erline Levine, MD;  Location: [place] NEURO ORS;  Service: Neurosurgery;  Laterality: Bilateral;  Bilateral implantable pulse generator placement  . skin cancer removed     from ears,   12 lft arm  rt leg 15  . SUBTHALAMIC STIMULATOR BATTERY REPLACEMENT Bilateral 07/14/2017   Procedure: BILATERAL IMPLANTED PULSE GENERATOR CHANGE FOR DEEP BRAIN STIMULATOR;  Surgeon: Erline Levine, MD;  Location: [place];  Service: Neurosurgery;  Laterality: Bilateral;  . SUBTHALAMIC STIMULATOR INSERTION Bilateral 12/06/2013   Procedure: SUBTHALAMIC STIMULATOR INSERTION;  Surgeon: Erline Levine, MD;  Location: [place] NEURO ORS;  Service: Neurosurgery;  Laterality: Bilateral;  Bilateral deep brain stimulator placement    There were no vitals filed for this visit.   Subjective Assessment - 06/01/20 1924    Subjective Hasn't been seen in almost 2 weeks. Patient's wife reports no falls in the past week since he went to the neurologist and had an adjustment to his deep brain stimulator.    Pertinent History [name] is a 70 y.o. male with a hx of HTN, bradycardia, Parkinson's s/p  deep brain stimulator 11/2013 , HLD, depression, possible seizure disorder, arthritis, depression/PTSD, dysrhythmia, GERD, sleep apnea. Patient was seen by this therapist in years past. Patient had a fall around Halloween of 2020 and admitted for ORIF on 02/18/19, was discharged to SNF and had [place] around Christmas. Had home health therapy for a month and a half. After that tried to go to Outpatient Therapy for 3 sessions at [facility] and was not pleased due to how busy and crowded the location was. Fell last week in bathroom,  didn't take walker in, falls about 2x month. Is very weak in RLE. Patient has a new lift chair and U drive walking. Has a caregiver 4 days a week. Has additional order for radiculopathy, lumbar region. PMH of lumbar laminectomy/decompression microdiscectomy (L3-4, 4-5)  In 2014.  Lumbar 4-5 cage +rod (2016).    Limitations Walking;Standing;Sitting;House hold activities;Lifting    How long can you sit comfortably? not limited with a backrest, without a back rest 3 minutes    How long can you stand comfortably? 3-5 minutes with holding on.    How long can you walk comfortably? with U step 6 minutes.    Patient Stated Goals pain reduction of R hip. strength of legs, dance with wife. push lawnmower.    Currently in Pain? No/denies                  Hasn't been seen in almost 2 weeks. Patient's wife reports no falls in the past week since he went to the neurologist and had an adjustment to his deep brain stimulator.      Supine:   Bridge 15x  BTB abduction 20x  BTB marching 15x    3lb ankle weight: -SLR 10x each LE -knee flexion/extension 15x each LE -hip abduction 10x each LE     Sidelying: Clamshells 15x each side (added to HEP) Hip extension with assistance 15x each LE   Seated: 3lb ankle weight - march 15x each LE - LAQ 15x each LE -alternating ER with GTB between knees 15x each LE    Standing with RW: -upright posture looking at wife in eyes for cue of positioning 2x 60 seconds -upright posture reaching/grabbing green ball from wife with CGA from PT, cues for weight shift -saebo ball transfer each UE, cueing for weight shift, UE and LE positioning and safety.     Pt educated throughout session about proper posture and technique with exercises. Improved exercise technique, movement at target joints, use of target muscles after min to mod verbal, visual, tactile cues.   Vitals monitored to ensure therapeutic values with rest breaks as needed      Patient returned  to therapy after approximately two week absence. Patient and patient's wife educated on need for compliance of attendance, as they missed multiple sessions for poor weather. Patient is agreeable and understands next session is a recert. Patient would benefit from skilled physical therapy to increase strength, stability, balance, and decrease pain to improve quality of life                PT Education - 06/01/20 1926    Education provided Yes    Education Details exercise technique, body mechanics    Person(s) Educated Patient    Methods Explanation;Demonstration;Tactile cues;Verbal cues    Comprehension Verbalized understanding;Returned demonstration;Verbal cues required;Tactile cues required            PT Short Term Goals - 01/08/20 1024      PT SHORT  TERM GOAL #1   Title Pt will perform HEP with family's/aide's supervision, for improved balance, transfers, and gait.    Baseline 5/5: HEP given 6/28: HEP compliant 8/23: aide helps with HEP    Time 4    Period Weeks    Status Achieved    Target Date 09/19/19             PT Long Term Goals - 04/27/20 1259      PT LONG TERM GOAL #1   Title Patient will increase Berg Balance score by > 6 points (54/56) to demonstrate decreased fall risk during functional activities.    Baseline 10/20: 48/56 11/10: 49/56 12/22: 45/56 1/10: 39/56    Time 8    Period Weeks    Status Partially Met    Target Date 06/03/20      PT LONG TERM GOAL #2   Title Patient will ambulate within his household for ~ 5 minutes LRAD with no episodes of falling to increase independence with household mobility    Baseline 10/20: requires use of rollator to ambulate 11/10: requires AD 12/22: deferred due to recent increase in falls 1/10 deferred due to falls    Time 8    Period Weeks    Status Deferred    Target Date 06/03/20      PT LONG TERM GOAL #4   Title Patient will increase FOTO score to equal to or greater than 31/100  to demonstrate  statistically significant improvement in mobility and quality of life.    Baseline 5/5: 12/100 6/28: 38% 8/23: 11.7 9/22: 45% 10/20: 47.9% 11/10: 40.2%; 04/01/20: 40% 1/10: 40.3%    Time 8    Period Weeks    Status Achieved      PT LONG TERM GOAL #5   Title Patient will increase six minute walk test distance to >1000 for progression to community ambulator and improve gait ability    Baseline 6/28: 750 ft with U step rollator 8/23: 775 w rolling walker, increased crouch pattern of ambulation 9/22: deferred due to pain; 10/18: 841f 11/10: 770 ft 12/22: 695 ft with rolator 1/10: 902 ft; L foot drag    Time 8    Period Weeks    Status Partially Met    Target Date 06/03/20      PT LONG TERM GOAL #6   Title Patient will increase Berg Balance score by > 6 points (42/56) to demonstrate decreased fall risk during functional activities.    Baseline 6/28: 36/56 8/23: 42/56 9/22 44/56 10/20: 48/56    Time 8    Period Weeks    Status Achieved      PT LONG TERM GOAL #7   Title Patient will report a worst pain of 3/10 on VAS in low back to improve tolerance with ADLs and reduced symptoms with activities.    Baseline 8/9: 9/10 8/23: 7/10 pain 9/22: 7/10 10/20: 5/10 11/10:  one episode of 9/10 when fell  the rest 4-6/10 12/22: 4/10    Time 8    Period Weeks    Status Partially Met      PT LONG TERM GOAL #8   Title Patient will reduce modified Oswestry score to <20 as to demonstrate minimal disability with ADLs including improved sleeping tolerance, walking/sitting tolerance etc for better mobility with ADLs.    Baseline 8/9: 64% 8/23: 66% 9/22:44% 10/20: 40% 11/20: 54% 12/22: 52% 1/10:  56%    Time 8    Period Weeks  Status Partially Met    Target Date 06/03/20                 Plan - 06/01/20 1928    Clinical Impression Statement Patient returned to therapy after approximately two week absence. Patient and patient's wife educated on need for compliance of attendance, as they missed  multiple sessions for poor weather. Patient is agreeable and understands next session is a recert. Patient would benefit from skilled physical therapy to increase strength, stability, balance, and decrease pain to improve quality of life    Personal Factors and Comorbidities Age;Comorbidity 3+;Past/Current Experience;Time since onset of injury/illness/exacerbation;Transportation    Comorbidities athritis, bradycardia, cancer, depression, dysrhythmia, GERD, HTN, Parkinson's, PTSD, SOB, sleep apnea    Examination-Activity Limitations Bathing;Bed Mobility;Caring for Others;Bend;Dressing;Lift;Locomotion Level;Reach Overhead;Sit;Squat;Stairs;Stand;Toileting;Transfers    Examination-Participation Restrictions Church;Cleaning;Community Activity;Interpersonal Relationship;Shop;Volunteer;Yard Work;Other    Stability/Clinical Decision Making Stable/Uncomplicated    Rehab Potential Fair    PT Frequency 2x / week    PT Duration 8 weeks    PT Treatment/Interventions ADLs/Self Care Home Management;Aquatic Therapy;Cryotherapy;Electrical Stimulation;Iontophoresis 14m/ml Dexamethasone;Moist Heat;Ultrasound;Contrast Bath;DME Instruction;Gait training;Stair training;Functional mobility training;Therapeutic activities;Therapeutic exercise;Balance training;Neuromuscular re-education;Patient/family education;Manual techniques;Wheelchair mobility training;Energy conservation;Passive range of motion;Traction;Orthotic Fit/Training;Dry needling;Vestibular;Visual/perceptual remediation/compensation;Taping;Canalith Repostioning    PT Next Visit Plan core and pain relief    PT Home Exercise Plan [place] Agree with Plan of Care Patient;Family member/caregiver    Family Member Consulted wife           Patient will benefit from skilled therapeutic intervention in order to improve the following deficits and impairments:  Abnormal gait,Decreased balance,Decreased coordination,Decreased mobility,Impaired  tone,Postural dysfunction,Decreased strength,Decreased safety awareness,Improper body mechanics,Impaired flexibility,Decreased activity tolerance,Decreased endurance,Decreased knowledge of precautions,Difficulty walking,Pain,Cardiopulmonary status limiting activity,Decreased range of motion,Impaired perceived functional ability  Visit Diagnosis: Muscle weakness (generalized)  Other lack of coordination  Parkinson's disease ([place]  Unsteadiness on feet     Problem List Patient Active Problem List   Diagnosis Date Noted  . Vertigo 02/19/2020  . Urine abnormality 02/19/2020  . E. coli bacteremia 02/23/2019  . Dysphagia 02/23/2019  . Aspiration pneumonia ([place] 02/21/2019  . Closed right hip fracture ([place] 02/16/2019  . Rib pain on right side 09/25/2018  . Skin sore 12/01/2017  . Hematoma 10/18/2017  . Fall at home 10/18/2017  . REM behavioral disorder 11/02/2016  . Radicular pain in right arm 10/09/2016  . GERD (gastroesophageal reflux disease) 07/31/2016  . Trochanteric bursitis 03/03/2016  . Left hand pain 10/30/2015  . Fracture, finger, multiple sites 10/30/2015  . Colon cancer screening 07/23/2015  . Healthcare maintenance 07/23/2015  . Gout 02/18/2015  . Depression 01/06/2015  . Irritation of eyelid 08/20/2014  . Dupuytren's contracture 08/20/2014  . Cough 07/21/2014  . Lumbar stenosis with neurogenic claudication 06/13/2014  . Preop cardiovascular exam 05/28/2014  . S/P deep brain stimulator placement 05/08/2014  . Joint pain 01/22/2014  . Medicare annual wellness visit, initial 01/19/2014  . Advance care planning 01/19/2014  . Parkinson's disease ([place] 12/13/2013  . PTSD (post-traumatic stress disorder) 06/13/2013  . Erectile dysfunction 06/13/2013  . HLD (hyperlipidemia) 06/13/2013  . Hip pain 06/10/2013  . Pain in joint, shoulder region 06/10/2013  . Right leg swelling 06/03/2013  . Essential hypertension 06/03/2013  . Bradycardia by electrocardiogram  06/03/2013  . Obstructive sleep apnea 03/12/2013  . Spinal stenosis, lumbar region, with neurogenic claudication 12/14/2012   MJanna Arch PT, DPT   06/01/2020, 7:30 PM  [facility] [address]  [place], [place], [address] Phone: [phone]   Fax:  [phone]  Name: [name] MRN: [id number] Date of Birth: 1950-12-11

## 2020-06-03 ENCOUNTER — Encounter: Payer: Self-pay | Admitting: Occupational Therapy

## 2020-06-03 ENCOUNTER — Other Ambulatory Visit: Payer: Self-pay

## 2020-06-03 ENCOUNTER — Ambulatory Visit: Payer: Medicare PPO | Admitting: Occupational Therapy

## 2020-06-03 ENCOUNTER — Telehealth: Payer: Self-pay | Admitting: Family Medicine

## 2020-06-03 ENCOUNTER — Ambulatory Visit: Payer: Medicare PPO

## 2020-06-03 DIAGNOSIS — R278 Other lack of coordination: Secondary | ICD-10-CM | POA: Diagnosis not present

## 2020-06-03 DIAGNOSIS — R2681 Unsteadiness on feet: Secondary | ICD-10-CM

## 2020-06-03 DIAGNOSIS — G20 Parkinson's disease: Secondary | ICD-10-CM | POA: Diagnosis not present

## 2020-06-03 DIAGNOSIS — M6281 Muscle weakness (generalized): Secondary | ICD-10-CM | POA: Diagnosis not present

## 2020-06-03 NOTE — Therapy (Signed)
[place] MAIN [facility] SERVICES [address] [place], [place], [address] Phone: [phone]   Fax:  [phone]  Occupational Therapy Treatment/Recertification/Discharge Note Occupational Therapy Progress Note  Dates of reporting period  03/18/2020   to   06/03/2020  Patient Details  Name: [name] MRN: [id number] Date of Birth: May 28, 1950 No data recorded  Encounter Date: 06/03/2020   OT End of Session - 06/03/20 1037    Visit Number 30    Number of Visits 76    Date for OT Re-Evaluation 06/01/20    Authorization Type Reporting period starting 03/18/2020    OT Start Time 1000    OT Stop Time 1035    OT Time Calculation (min) 35 min    Activity Tolerance Patient tolerated treatment well    Behavior During Therapy [facility] for tasks assessed/performed           Past Medical History:  Diagnosis Date  . Arthritis   . Bradycardia   . Cancer [facility]) 2013   skin cancer  . Depression    ptsd  . Dysrhythmia    chronic slow heart rate  . GERD (gastroesophageal reflux disease)   . Headache(784.0)    tension headaches non recent  . History of chicken pox   . History of kidney stones    passed  . Hypertension    treated with HCTZ  . Parkinson's disease ([place])    dx'ed 15 years ago  . PTSD (post-traumatic stress disorder)   . Shortness of breath dyspnea   . Sleep apnea    doesn't use C-pap  . Varicose veins     Past Surgical History:  Procedure Laterality Date  . CHOLECYSTECTOMY N/A 10/22/2014   Procedure: LAPAROSCOPIC CHOLECYSTECTOMY WITH INTRAOPERATIVE CHOLANGIOGRAM;  Surgeon: Dia Crawford III, MD;  Location: [facility] ORS;  Service: General;  Laterality: N/A;  . cyst removed      from lip as a child  . INTRAMEDULLARY (IM) NAIL INTERTROCHANTERIC N/A 02/18/2019   Procedure: INTRAMEDULLARY (IM) NAIL INTERTROCHANTRIC, RIGHT,;  Surgeon: Thornton Park, MD;  Location: [facility] ORS;  Service: Orthopedics;  Laterality: N/A;  . LUMBAR  LAMINECTOMY/DECOMPRESSION MICRODISCECTOMY Bilateral 12/14/2012   Procedure: Bilateral lumbar three-four, four-five decompressive laminotomy/foraminotomy;  Surgeon: Charlie Pitter, MD;  Location: [place] NEURO ORS;  Service: Neurosurgery;  Laterality: Bilateral;  . PULSE GENERATOR IMPLANT Bilateral 12/13/2013   Procedure: Bilateral implantable pulse generator placement;  Surgeon: Erline Levine, MD;  Location: [place] NEURO ORS;  Service: Neurosurgery;  Laterality: Bilateral;  Bilateral implantable pulse generator placement  . skin cancer removed     from ears,   12 lft arm  rt leg 15  . SUBTHALAMIC STIMULATOR BATTERY REPLACEMENT Bilateral 07/14/2017   Procedure: BILATERAL IMPLANTED PULSE GENERATOR CHANGE FOR DEEP BRAIN STIMULATOR;  Surgeon: Erline Levine, MD;  Location: [place];  Service: Neurosurgery;  Laterality: Bilateral;  . SUBTHALAMIC STIMULATOR INSERTION Bilateral 12/06/2013   Procedure: SUBTHALAMIC STIMULATOR INSERTION;  Surgeon: Erline Levine, MD;  Location: [place] NEURO ORS;  Service: Neurosurgery;  Laterality: Bilateral;  Bilateral deep brain stimulator placement    There were no vitals filed for this visit.   Subjective Assessment - 06/03/20 1039    Subjective  Pt. and his wife are requesting to transition all therapies to [place].    Patient is accompanied by: Family member    Pertinent History Pt. is a 70 yo male who has a history of Parkinson's Disease. Pt. has a Deep Brain Stimulator in place. Pt. has a  history of Low Back Pain with 2 back surgeries in August 2014, and February 2016. Pt. has a history of multiple frequent falls, unsure of how many falls in the last 6 months.    Limitations Frequent falls, limited motor control, and FMC.     Patient Stated Goals Pt would like to be more independent daily.    Currently in Pain? No/denies              [facility] OT Assessment - 06/03/20 0001      Coordination   Right 9 Hole Peg Test 1min.& 5 sec.    Left 9 Hole Peg Test 37 sec,      Strength    Overall Strength Comments 5/5 overall strength      Hand Function   Right Hand Grip (lbs) 60    Right Hand Lateral Pinch 17 lbs    Right Hand 3 Point Pinch 15 lbs    Left Hand Grip (lbs) 63    Left Hand Lateral Pinch 18 lbs    Left 3 point pinch 18 lbs          Pt., and is his wife are requesting to transition all therapies to home health services for the time being. Measurements were obtained, and goals were reviewed.  Pt. has improved with bilateral grip strength, and 3pt. pinch strength, left lateral pinch strength, and left FMC skills. Pt. presented with decreased right lateral pinch strength, and right hand FMC skills. Pt. has made progress overall, and continues to work on improving UE strength, and FMC skills.  Pt. has improved with writing legibility, however continues to present with difficulty with typing speed, and accuracy. Dressing, and self-care tasks fluctuate day to day dependent upon how the pt. Is feeling, and the time since medication was taken.  Pt. continues to have frequent falls, and has decreased safety awareness affecting judgement. OT services are discharged, and plan to request to transition therapy services to Home health services.                  OT Education - 06/03/20 1041    Education provided Yes    Education Details A/E use, left hand strength    Person(s) Educated Patient;Spouse    Methods Explanation    Comprehension Verbalized understanding               OT Long Term Goals - 06/03/20 1015      OT LONG TERM GOAL #1   Title Pt. will improve Bilateral UE strength by 1 mm grade to assist with ADLs, and IADLs    Baseline BUE strength 5/5, BUE strength 4/5 overall at eval    Time 12    Period Weeks    Status Achieved      OT LONG TERM GOAL #2   Title Pt. will improve grip strength by 10# to open jars and containers.    Baseline Pt. has improved with grip strength, and is able to open jars.    Time 12    Period Weeks    Status  Achieved      OT LONG TERM GOAL #3   Title Patient will complete lower body dressing with modified independence.    Baseline Fluctuates, and dependent upon the day. Some days independent, some days minA, and some days modA    Time 12    Period Weeks    Status Partially Met      OT LONG TERM GOAL #4   Title   Patient to demonstrate the ability to vacuum flooring at home with use of modified techniques with supervision.    Baseline At times pt. performs independently, howeever is a safety concern. recommend Supervsion/CGA for safety.    Time 12    Period Weeks    Status Achieved      OT LONG TERM GOAL #5   Title Patient will demonstrate the ability to retrieve a snack and transport it to the living room with modified independence.    Baseline Pt. requires supervison with the walker  when transporting items    Time 12    Period Weeks    Status Partially Met      OT LONG TERM GOAL #6   Title Patient will improve hand function to be able to perform cutting of food with modified independence.    Baseline Dependent upon the day, and the time of the day    Time 12    Period Weeks    Status Partially Met      OT LONG TERM GOAL #7   Title Pt. will write a paragraph efficiently with 90% legibility.    Baseline Writing legibility is 75% when completing a written letter.    Time 12    Period Weeks    Status On-going      OT LONG TERM GOAL #8   Title Pt. will improve typing speed, and accuracy to be able to to type emails efficently    Baseline Continues to be limited    Time 12    Period Weeks    Status Not Met      OT LONG TERM GOAL  #9   Baseline Pt will transfers in and out of the shower with stand-by assistance    Period Weeks    Status Partially Met      OT LONG TERM GOAL  #10   TITLE Pt will don and doff pants with supervision    Baseline Independent with jeans, and sweatpants. Assist to stand tand fasten pants. Assist with dress pants.    Time 12    Period Weeks     Status On-going      OT LONG TERM GOAL  #11   TITLE Pt will don and doff socks modified independently using AE, PRN    Baseline Mostly independent, depending upon the type of sock.    Time 6    Period Weeks    Status Partially Met      OT LONG TERM GOAL  #12   TITLE Pt will independently tie shoes    Baseline Pt. uses elactic laces    Time 6    Period Weeks    Status Partially Met                 Plan - 06/03/20 1042    Clinical Impression Statement Pt., and is his wife are requesting to transition all therapies to home health services for the time being. Measurements were obtained, and goals were reviewed.  Pt. has improved with bilateral grip strength, and 3pt. pinch strength, left lateral pinch strength, and left FMC skills. Pt. presented with decreased right lateral pinch strength, and right hand FMC skills. Pt. has made progress overall, and continues to work on improving UE strength, and FMC skills.  Pt. has improved with writing legibility, however continues to present with difficulty with typing speed, and accuracy. Dressing, and self-care tasks fluctuate day to day dependent upon how the pt. Is feeling, and the time   since medication was taken.  Pt. continues to have frequent falls, and has decreased safety awareness affecting judgement. OT services are discharged, and plan to request to transition therapy services to Home health services.     OT Occupational Profile and History Detailed Assessment- Review of Records and additional review of physical, cognitive, psychosocial history related to current functional performance    Occupational performance deficits (Please refer to evaluation for details): ADL's;IADL's;Leisure    Body Structure / Function / Physical Skills ADL;Coordination;Endurance;GMC;UE functional use;Balance;Decreased knowledge of use of DME;IADL;Pain;Dexterity;FMC;Strength;Mobility;ROM    Cognitive Skills Attention;Problem Solve;Memory    Psychosocial Skills  Coping Strategies;Environmental  Adaptations;Habits;Routines and Behaviors    Rehab Potential Good    Clinical Decision Making Limited treatment options, no task modification necessary    Comorbidities Affecting Occupational Performance: Presence of comorbidities impacting occupational performance    Comorbidities impacting occupational performance description: risk for falls, slow to respond and process information at times, safety    Modification or Assistance to Complete Evaluation  No modification of tasks or assist necessary to complete eval    OT Frequency 2x / week    OT Duration 2 weeks    OT Treatment/Interventions Self-care/ADL training;Therapeutic exercise;DME and/or AE instruction;Energy conservation;Neuromuscular education;Patient/family education;Therapeutic activities;Moist Heat;Functional Mobility Training;Cognitive remediation/compensation    Consulted and Agree with Plan of Care Patient           Patient will benefit from skilled therapeutic intervention in order to improve the following deficits and impairments:   Body Structure / Function / Physical Skills: ADL,Coordination,Endurance,GMC,UE functional use,Balance,Decreased knowledge of use of DME,IADL,Pain,Dexterity,FMC,Strength,Mobility,ROM Cognitive Skills: Attention,Problem Solve,Memory Psychosocial Skills: Coping Strategies,Environmental  Adaptations,Habits,Routines and Behaviors   Visit Diagnosis: Muscle weakness (generalized)    Problem List Patient Active Problem List   Diagnosis Date Noted  . Vertigo 02/19/2020  . Urine abnormality 02/19/2020  . E. coli bacteremia 02/23/2019  . Dysphagia 02/23/2019  . Aspiration pneumonia (HCC) 02/21/2019  . Closed right hip fracture (HCC) 02/16/2019  . Rib pain on right side 09/25/2018  . Skin sore 12/01/2017  . Hematoma 10/18/2017  . Fall at home 10/18/2017  . REM behavioral disorder 11/02/2016  . Radicular pain in right arm 10/09/2016  . GERD (gastroesophageal  reflux disease) 07/31/2016  . Trochanteric bursitis 03/03/2016  . Left hand pain 10/30/2015  . Fracture, finger, multiple sites 10/30/2015  . Colon cancer screening 07/23/2015  . Healthcare maintenance 07/23/2015  . Gout 02/18/2015  . Depression 01/06/2015  . Irritation of eyelid 08/20/2014  . Dupuytren's contracture 08/20/2014  . Cough 07/21/2014  . Lumbar stenosis with neurogenic claudication 06/13/2014  . Preop cardiovascular exam 05/28/2014  . S/P deep brain stimulator placement 05/08/2014  . Joint pain 01/22/2014  . Medicare annual wellness visit, initial 01/19/2014  . Advance care planning 01/19/2014  . Parkinson's disease (HCC) 12/13/2013  . PTSD (post-traumatic stress disorder) 06/13/2013  . Erectile dysfunction 06/13/2013  . HLD (hyperlipidemia) 06/13/2013  . Hip pain 06/10/2013  . Pain in joint, shoulder region 06/10/2013  . Right leg swelling 06/03/2013  . Essential hypertension 06/03/2013  . Bradycardia by electrocardiogram 06/03/2013  . Obstructive sleep apnea 03/12/2013  . Spinal stenosis, lumbar region, with neurogenic claudication 12/14/2012     , MS, OTR/L 06/03/2020, 10:45 AM  [place] [facility] [address] [place], [place], [address] Phone: [phone]   Fax:  [phone]  Name: Mihailo D Sherpa MRN: [id number] Date of Birth: 10/24/1950 

## 2020-06-03 NOTE — Therapy (Signed)
[place] MAIN [facility] SERVICES [address] [place], [place], [address] Phone: [phone]   Fax:  [phone]  Physical Therapy Treatment/RECERT  Patient Details  Name: [name] MRN: [id number] Date of Birth: [birth year] Referring Provider (PT): Sharolyn Douglas MD    Encounter Date: 06/03/2020   PT End of Session - 06/03/20 1312    Visit Number 47    Number of Visits 56    Date for PT Re-Evaluation 06/03/20    Authorization Time Period FOTO- PT    PT Start Time 0930    PT Stop Time 0959    PT Time Calculation (min) 29 min    Equipment Utilized During Treatment Gait belt    Activity Tolerance Patient tolerated treatment well;No increased pain;Patient limited by fatigue    Behavior During Therapy [facility] for tasks assessed/performed           Past Medical History:  Diagnosis Date  . Arthritis   . Bradycardia   . Cancer [facility]) 2013   skin cancer  . Depression    ptsd  . Dysrhythmia    chronic slow heart rate  . GERD (gastroesophageal reflux disease)   . Headache(784.0)    tension headaches non recent  . History of chicken pox   . History of kidney stones    passed  . Hypertension    treated with HCTZ  . Parkinson's disease ([place])    dx'ed 15 years ago  . PTSD (post-traumatic stress disorder)   . Shortness of breath dyspnea   . Sleep apnea    doesn't use C-pap  . Varicose veins     Past Surgical History:  Procedure Laterality Date  . CHOLECYSTECTOMY N/A 10/22/2014   Procedure: LAPAROSCOPIC CHOLECYSTECTOMY WITH INTRAOPERATIVE CHOLANGIOGRAM;  Surgeon: Dia Crawford III, MD;  Location: [facility] ORS;  Service: General;  Laterality: N/A;  . cyst removed      from lip as a child  . INTRAMEDULLARY (IM) NAIL INTERTROCHANTERIC N/A 02/18/2019   Procedure: INTRAMEDULLARY (IM) NAIL INTERTROCHANTRIC, RIGHT,;  Surgeon: Thornton Park, MD;  Location: [facility] ORS;  Service: Orthopedics;  Laterality: N/A;  . LUMBAR LAMINECTOMY/DECOMPRESSION  MICRODISCECTOMY Bilateral 12/14/2012   Procedure: Bilateral lumbar three-four, four-five decompressive laminotomy/foraminotomy;  Surgeon: Charlie Pitter, MD;  Location: [place] [facility] ORS;  Service: Neurosurgery;  Laterality: Bilateral;  . PULSE GENERATOR IMPLANT Bilateral 12/13/2013   Procedure: Bilateral implantable pulse generator placement;  Surgeon: Erline Levine, MD;  Location: [place] [facility] ORS;  Service: Neurosurgery;  Laterality: Bilateral;  Bilateral implantable pulse generator placement  . skin cancer removed     from ears,   12 lft arm  rt leg 15  . SUBTHALAMIC STIMULATOR BATTERY REPLACEMENT Bilateral 07/14/2017   Procedure: BILATERAL IMPLANTED PULSE GENERATOR CHANGE FOR DEEP BRAIN STIMULATOR;  Surgeon: Erline Levine, MD;  Location: [place];  Service: Neurosurgery;  Laterality: Bilateral;  . SUBTHALAMIC STIMULATOR INSERTION Bilateral 12/06/2013   Procedure: SUBTHALAMIC STIMULATOR INSERTION;  Surgeon: Erline Levine, MD;  Location: [place] [facility] ORS;  Service: Neurosurgery;  Laterality: Bilateral;  Bilateral deep brain stimulator placement    There were no vitals filed for this visit.   Subjective Assessment - 06/03/20 1311    Subjective Patient requests today to be his last outpatient visit as it is just too hard to get out of the house.    Pertinent History [name] is a 70 y.o. male with a hx of HTN, bradycardia, Parkinson's s/p deep brain stimulator 11/2013 , HLD, depression, possible seizure disorder,  arthritis, depression/PTSD, dysrhythmia, GERD, sleep apnea. Patient was seen by this therapist in years past. Patient had a fall around Halloween of 2020 and admitted for ORIF on 02/18/19, was discharged to SNF and had [place] around Christmas. Had home health therapy for a month and a half. After that tried to go to Outpatient Therapy for 3 sessions at [facility] and was not pleased due to how busy and crowded the location was. Fell last week in bathroom, didn't take walker in, falls about 2x month. Is very  weak in RLE. Patient has a new lift chair and U drive walking. Has a caregiver 4 days a week. Has additional order for radiculopathy, lumbar region. PMH of lumbar laminectomy/decompression microdiscectomy (L3-4, 4-5)  In 2014.  Lumbar 4-5 cage +rod (2016).    Limitations Walking;Standing;Sitting;House hold activities;Lifting    How long can you sit comfortably? not limited with a backrest, without a back rest 3 minutes    How long can you stand comfortably? 3-5 minutes with holding on.    How long can you walk comfortably? with U step 6 minutes.    Patient Stated Goals pain reduction of R hip. strength of legs, dance with wife. push lawnmower.    Currently in Pain? No/denies              Discharge   FOTO: 40%  MODI: 50%   Access Code: [email or web address] URL: [email or web address] Date: 06/03/2020 Prepared by: Janna Arch  Exercises Seated March - 1 x daily - 7 x weekly - 3 sets - 10 reps Seated Hip Adduction Isometrics with Ball - 1 x daily - 7 x weekly - 3 sets - 10 reps Seated Long Arc Quad - 1 x daily - 7 x weekly - 3 sets - 10 reps Seated Shoulder Flexion Full Range - 1 x daily - 7 x weekly - 3 sets - 10 reps Seated Shoulder Circles - 1 x daily - 7 x weekly - 3 sets - 10 reps Seated Scapular Retraction - 1 x daily - 7 x weekly - 3 sets - 10 reps Supine Ankle Pumps - 1 x daily - 7 x weekly - 3 sets - 15 reps Supine Quadricep Sets - 1 x daily - 7 x weekly - 3 sets - 15 reps Supine Isometric Hip Adduction with Pillow at Knees - 1 x daily - 7 x weekly - 3 sets - 15 reps Supine Gluteal Sets - 1 x daily - 7 x weekly - 3 sets - 15 reps Supine Heel Slides - 1 x daily - 7 x weekly - 3 sets - 15 reps Supine Hip Abduction - 1 x daily - 7 x weekly - 3 sets - 15 reps Supine Active Straight Leg Raise - 1 x daily - 7 x weekly - 3 sets - 15 reps Supine Short Arc Quad - 1 x daily - 7 x weekly - 3 sets - 15 reps     Pt educated throughout session about proper posture and technique  with exercises. Improved exercise technique, movement at target joints, use of target muscles after min to mod verbal, visual, tactile cues.   Vitals monitored to ensure therapeutic values with rest breaks as needed   Patient returned to therapy after approximately two week absence. Patient and patient's wife educated on need for compliance of attendance, as they missed multiple sessions for poor weather. Patient is agreeable and understands next session is a recert. Patient would benefit from skilled physical therapy to increase strength,  stability, balance, and decrease pain to improve quality of life     Patient reports he wants a break from therapy, is finding it too challenging to get out of the house to get to therapy. Patient would benefit from HHPT at this time, discussed with primary care physician who is in agreeance.  Patient and wife educated on HEP.  I will be happy to see this patient again in the future as needed.                    PT Education - 06/03/20 1312    Education provided Yes    Education Details discharge    Person(s) Educated Patient    Methods Explanation;Demonstration;Tactile cues;Verbal cues    Comprehension Verbalized understanding;Returned demonstration;Verbal cues required;Tactile cues required            PT Short Term Goals - 01/08/20 1024      PT SHORT TERM GOAL #1   Title Pt will perform HEP with family's/aide's supervision, for improved balance, transfers, and gait.    Baseline 5/5: HEP given 6/28: HEP compliant 8/23: aide helps with HEP    Time 4    Period Weeks    Status Achieved    Target Date 09/19/19             PT Long Term Goals - 06/03/20 1316      PT LONG TERM GOAL #1   Title Patient will increase Berg Balance score by > 6 points (54/56) to demonstrate decreased fall risk during functional activities.    Baseline 10/20: 48/56 11/10: 49/56 12/22: 45/56 1/10: 39/56    Time 8    Period Weeks    Status Not Met       PT LONG TERM GOAL #2   Title Patient will ambulate within his household for ~ 5 minutes LRAD with no episodes of falling to increase independence with household mobility    Baseline 10/20: requires use of rollator to ambulate 11/10: requires AD 12/22: deferred due to recent increase in falls 1/10 deferred due to falls    Time 8    Period Weeks    Status Deferred      PT LONG TERM GOAL #4   Title Patient will increase FOTO score to equal to or greater than 31/100  to demonstrate statistically significant improvement in mobility and quality of life.    Baseline 5/5: 12/100 6/28: 38% 8/23: 11.7 9/22: 45% 10/20: 47.9% 11/10: 40.2%; 04/01/20: 40% 1/10: 40.3% 2/16: 40.3%    Time 8    Period Weeks    Status Achieved      PT LONG TERM GOAL #5   Title Patient will increase six minute walk test distance to >1000 for progression to community ambulator and improve gait ability    Baseline 6/28: 750 ft with U step rollator 8/23: 775 w rolling walker, increased crouch pattern of ambulation 9/22: deferred due to pain; 10/18: 871f 11/10: 770 ft 12/22: 695 ft with rolator 1/10: 902 ft; L foot drag    Time 8    Period Weeks    Status Not Met      PT LONG TERM GOAL #6   Title Patient will increase Berg Balance score by > 6 points (42/56) to demonstrate decreased fall risk during functional activities.    Baseline 6/28: 36/56 8/23: 42/56 9/22 44/56 10/20: 48/56    Time 8    Period Weeks    Status Achieved  PT LONG TERM GOAL #7   Title Patient will report a worst pain of 3/10 on VAS in low back to improve tolerance with ADLs and reduced symptoms with activities.    Baseline 8/9: 9/10 8/23: 7/10 pain 9/22: 7/10 10/20: 5/10 11/10:  one episode of 9/10 when fell  the rest 4-6/10 12/22: 4/10 2/16: 4/10    Time 8    Period Weeks    Status Partially Met      PT LONG TERM GOAL #8   Title Patient will reduce modified Oswestry score to <20 as to demonstrate minimal disability with ADLs including  improved sleeping tolerance, walking/sitting tolerance etc for better mobility with ADLs.    Baseline 8/9: 64% 8/23: 66% 9/22:44% 10/20: 40% 11/20: 54% 12/22: 52% 1/10:  56% 2/16: 50%    Time 8    Period Weeks    Status Partially Met                 Plan - 06/03/20 1316    Clinical Impression Statement Patient reports he wants a break from therapy, is finding it too challenging to get out of the house to get to therapy. Patient would benefit from HHPT at this time, discussed with primary care physician who is in agreeance.  Patient and wife educated on HEP.  I will be happy to see this patient again in the future as needed.    Personal Factors and Comorbidities Age;Comorbidity 3+;Past/Current Experience;Time since onset of injury/illness/exacerbation;Transportation    Comorbidities athritis, bradycardia, cancer, depression, dysrhythmia, GERD, HTN, Parkinson's, PTSD, SOB, sleep apnea    Examination-Activity Limitations Bathing;Bed Mobility;Caring for Others;Bend;Dressing;Lift;Locomotion Level;Reach Overhead;Sit;Squat;Stairs;Stand;Toileting;Transfers    Examination-Participation Restrictions Church;Cleaning;Community Activity;Interpersonal Relationship;Shop;Volunteer;Yard Work;Other    Stability/Clinical Decision Making Stable/Uncomplicated    Rehab Potential Fair    PT Frequency 2x / week    PT Duration 8 weeks    PT Treatment/Interventions ADLs/Self Care Home Management;Aquatic Therapy;Cryotherapy;Electrical Stimulation;Iontophoresis 4mg /ml Dexamethasone;Moist Heat;Ultrasound;Contrast Bath;DME Instruction;Gait training;Stair training;Functional mobility training;Therapeutic activities;Therapeutic exercise;Balance training;Neuromuscular re-education;Patient/family education;Manual techniques;Wheelchair mobility training;Energy conservation;Passive range of motion;Traction;Orthotic Fit/Training;Dry needling;Vestibular;Visual/perceptual remediation/compensation;Taping;Canalith Repostioning     PT Next Visit Plan core and pain relief    PT Home Exercise Plan [place] and Agree with Plan of Care Patient;Family member/caregiver    Family Member Consulted wife           Patient will benefit from skilled therapeutic intervention in order to improve the following deficits and impairments:  Abnormal gait,Decreased balance,Decreased coordination,Decreased mobility,Impaired tone,Postural dysfunction,Decreased strength,Decreased safety awareness,Improper body mechanics,Impaired flexibility,Decreased activity tolerance,Decreased endurance,Decreased knowledge of precautions,Difficulty walking,Pain,Cardiopulmonary status limiting activity,Decreased range of motion,Impaired perceived functional ability  Visit Diagnosis: Muscle weakness (generalized)  Other lack of coordination  Parkinson's disease ([place])  Unsteadiness on feet     Problem List Patient Active Problem List   Diagnosis Date Noted  . Vertigo 02/19/2020  . Urine abnormality 02/19/2020  . E. coli bacteremia 02/23/2019  . Dysphagia 02/23/2019  . Aspiration pneumonia ([place]) 02/21/2019  . Closed right hip fracture ([place]) 02/16/2019  . Rib pain on right side 09/25/2018  . Skin sore 12/01/2017  . Hematoma 10/18/2017  . Fall at home 10/18/2017  . REM behavioral disorder 11/02/2016  . Radicular pain in right arm 10/09/2016  . GERD (gastroesophageal reflux disease) 07/31/2016  . Trochanteric bursitis 03/03/2016  . Left hand pain 10/30/2015  . Fracture, finger, multiple sites 10/30/2015  . Colon cancer screening 07/23/2015  . Healthcare maintenance 07/23/2015  . Gout 02/18/2015  . Depression 01/06/2015  .  Irritation of eyelid 08/20/2014  . Dupuytren's contracture 08/20/2014  . Cough 07/21/2014  . Lumbar stenosis with neurogenic claudication 06/13/2014  . Preop cardiovascular exam 05/28/2014  . S/P deep brain stimulator placement 05/08/2014  . Joint pain 01/22/2014  . Medicare annual wellness visit, initial  01/19/2014  . Advance care planning 01/19/2014  . Parkinson's disease ([place]) 12/13/2013  . PTSD (post-traumatic stress disorder) 06/13/2013  . Erectile dysfunction 06/13/2013  . HLD (hyperlipidemia) 06/13/2013  . Hip pain 06/10/2013  . Pain in joint, shoulder region 06/10/2013  . Right leg swelling 06/03/2013  . Essential hypertension 06/03/2013  . Bradycardia by electrocardiogram 06/03/2013  . Obstructive sleep apnea 03/12/2013  . Spinal stenosis, lumbar region, with neurogenic claudication 12/14/2012   Janna Arch, PT, DPT   06/03/2020, 1:18 PM  [place] MAIN [facility] SERVICES [address] [place], [place], [address] Phone: [phone]   Fax:  [phone]  Name: DONNY HEFFERN MRN: [id number] Date of Birth: 06-03-1950

## 2020-06-03 NOTE — Progress Notes (Signed)
Agree.  Thanks.     Physician Documentation  Your signature is required to indicate approval of the treatment plan as stated above. By signing this report, you are approving the plan of care. Please sign and either send electronically or print and fax the signed copy to the number below. If you approve with modifications, please indicate those in the space provided Physician Signature: Elsie Stain  Date:___02/16/22____ Time:__12:32 PM ___

## 2020-06-03 NOTE — Telephone Encounter (Signed)
Note from Janna Arch PT  Patient is having too difficult of a time getting out of house to go to therapy at this time. Would it be possible to have referral for home health therapy for him as he wants to discharge from outpatient PT.    Order placed.  Will need face to face visit but VV should accomplish that.  Needs to be scheduled.  Thanks.

## 2020-06-03 NOTE — Telephone Encounter (Signed)
Can you call and schedule patient appt?

## 2020-06-08 ENCOUNTER — Ambulatory Visit: Payer: Medicare PPO | Admitting: Psychology

## 2020-06-08 ENCOUNTER — Ambulatory Visit: Payer: Medicare PPO | Admitting: Occupational Therapy

## 2020-06-08 ENCOUNTER — Ambulatory Visit: Payer: Medicare PPO

## 2020-06-08 NOTE — Telephone Encounter (Signed)
I have him scheduled for VV on 06/15/20 at 12

## 2020-06-10 ENCOUNTER — Ambulatory Visit: Payer: Medicare PPO

## 2020-06-10 ENCOUNTER — Ambulatory Visit: Payer: Medicare PPO | Admitting: Occupational Therapy

## 2020-06-11 DIAGNOSIS — R001 Bradycardia, unspecified: Secondary | ICD-10-CM | POA: Diagnosis not present

## 2020-06-15 ENCOUNTER — Other Ambulatory Visit: Payer: Self-pay

## 2020-06-15 ENCOUNTER — Encounter: Payer: Self-pay | Admitting: Family Medicine

## 2020-06-15 ENCOUNTER — Telehealth (INDEPENDENT_AMBULATORY_CARE_PROVIDER_SITE_OTHER): Payer: Medicare PPO | Admitting: Family Medicine

## 2020-06-15 DIAGNOSIS — G20 Parkinson's disease: Secondary | ICD-10-CM | POA: Diagnosis not present

## 2020-06-15 NOTE — Progress Notes (Signed)
Virtual visit completed through WebEx or similar program Patient location: home  Provider location: [place] at [facility], office  Participants: Patient and me (unless stated otherwise below)  Pandemic considerations d/w pt.   Limitations and rationale for visit method d/w patient.  Patient agreed to proceed.   CC: f/u, parkinson's.    HPI:  [place] last night, was reaching for an object. No injury.  No pain from the event.    He has vertigo with laying down, going on for about a few weeks.  Only when laying down.  Not when sitting up.  Lasts about 5 seconds.    He has f/u pending re: bradycardia then has neuro f/u pending after that re: inpatient EEG.  He usually isn't lightheaded on standing.    He didn't tolerate tramadol.  He using naproxen prn, not daily.  Swallowing well.    Needs HHPT/OT set up for vertigo and parkinsons.    He is taking boost in the meantime and using modified utensils.    Meds and allergies reviewed.   ROS: Per HPI unless specifically indicated in ROS section   NAD Speech wnl  A/P:  Parkinson's.  Needs home health OT PT set up.  No change in medications at this point.  He is avoiding tramadol.  Vertigo and fall cautions discussed with patient.  He has cardiology follow-up pending.  I will await that note.

## 2020-06-17 ENCOUNTER — Encounter: Payer: Self-pay | Admitting: Cardiology

## 2020-06-17 ENCOUNTER — Other Ambulatory Visit: Payer: Self-pay

## 2020-06-17 ENCOUNTER — Ambulatory Visit (INDEPENDENT_AMBULATORY_CARE_PROVIDER_SITE_OTHER): Payer: Medicare PPO | Admitting: Cardiology

## 2020-06-17 VITALS — BP 120/72 | HR 52 | Ht 69.0 in | Wt 181.0 lb

## 2020-06-17 DIAGNOSIS — G20 Parkinson's disease: Secondary | ICD-10-CM

## 2020-06-17 DIAGNOSIS — F0280 Dementia in other diseases classified elsewhere without behavioral disturbance: Secondary | ICD-10-CM | POA: Insufficient documentation

## 2020-06-17 DIAGNOSIS — I495 Sick sinus syndrome: Secondary | ICD-10-CM | POA: Diagnosis not present

## 2020-06-17 NOTE — Progress Notes (Signed)
Electrophysiology Office Follow up Visit Note:    Date:  06/17/2020   ID:  [name], DOB Dec 15, 1950, MRN [id number]  PCP:  Tonia Ghent, MD  [facility] Cardiologist:  Ida Rogue, MD  [facility] Electrophysiologist:  Vickie Epley, MD    Interval History:    [name] is a 70 y.o. male who presents for a follow up visit.  I last saw the patient December 11, 2019.  His medical history is complex and includes Parkinson's disease with a deep brain stimulator in place on the right side and tachycardia-bradycardia syndrome.  He has frequent episodes of symptomatic SVT that last seconds to minutes at a time.  This is complicated by bradycardia that is also seemingly symptomatic with fatigue with rates in the low 40s.  He also has very little heart rate variability on multiple ZIO monitors.  His wife is with him again today in clinic.   No syncope or presyncope.     Past Medical History:  Diagnosis Date  . Arthritis   . Bradycardia   . Cancer [facility]) 2013   skin cancer  . Depression    ptsd  . Dysrhythmia    chronic slow heart rate  . GERD (gastroesophageal reflux disease)   . Headache(784.0)    tension headaches non recent  . History of chicken pox   . History of kidney stones    passed  . Hypertension    treated with HCTZ  . Parkinson's disease ([place])    dx'ed 15 years ago  . PTSD (post-traumatic stress disorder)   . Shortness of breath dyspnea   . Sleep apnea    doesn't use C-pap  . Varicose veins     Past Surgical History:  Procedure Laterality Date  . CHOLECYSTECTOMY N/A 10/22/2014   Procedure: LAPAROSCOPIC CHOLECYSTECTOMY WITH INTRAOPERATIVE CHOLANGIOGRAM;  Surgeon: Dia Crawford III, MD;  Location: [facility] ORS;  Service: General;  Laterality: N/A;  . cyst removed      from lip as a child  . INTRAMEDULLARY (IM) NAIL INTERTROCHANTERIC N/A 02/18/2019   Procedure: INTRAMEDULLARY (IM) NAIL INTERTROCHANTRIC, RIGHT,;  Surgeon: Thornton Park, MD;   Location: [facility] ORS;  Service: Orthopedics;  Laterality: N/A;  . LUMBAR LAMINECTOMY/DECOMPRESSION MICRODISCECTOMY Bilateral 12/14/2012   Procedure: Bilateral lumbar three-four, four-five decompressive laminotomy/foraminotomy;  Surgeon: Charlie Pitter, MD;  Location: [place] NEURO ORS;  Service: Neurosurgery;  Laterality: Bilateral;  . PULSE GENERATOR IMPLANT Bilateral 12/13/2013   Procedure: Bilateral implantable pulse generator placement;  Surgeon: Erline Levine, MD;  Location: [place] NEURO ORS;  Service: Neurosurgery;  Laterality: Bilateral;  Bilateral implantable pulse generator placement  . skin cancer removed     from ears,   12 lft arm  rt leg 15  . SUBTHALAMIC STIMULATOR BATTERY REPLACEMENT Bilateral 07/14/2017   Procedure: BILATERAL IMPLANTED PULSE GENERATOR CHANGE FOR DEEP BRAIN STIMULATOR;  Surgeon: Erline Levine, MD;  Location: [place];  Service: Neurosurgery;  Laterality: Bilateral;  . SUBTHALAMIC STIMULATOR INSERTION Bilateral 12/06/2013   Procedure: SUBTHALAMIC STIMULATOR INSERTION;  Surgeon: Erline Levine, MD;  Location: [place] NEURO ORS;  Service: Neurosurgery;  Laterality: Bilateral;  Bilateral deep brain stimulator placement    Current Medications: Current Meds  Medication Sig  . aspirin EC 81 MG tablet Take 1 tablet (81 mg total) by mouth daily. Swallow whole.  . carbidopa-levodopa (SINEMET IR) 25-100 MG tablet 2 tablets in AM, 1.5 tablets in afternoon, and 1.5 tablets in the evening  . cholecalciferol (VITAMIN D) 1000 UNITS tablet Take 1,000  Units by mouth daily.  Marland Kitchen escitalopram (LEXAPRO) 10 MG tablet TAKE 1 TABLET DAILY  . Melatonin 10 MG TABS Take 10 mg by mouth at bedtime.  . memantine (NAMENDA) 10 MG tablet TAKE 1 TABLET TWICE A DAY  . naproxen sodium (ALEVE) 220 MG tablet Take 220 mg by mouth.  Marland Kitchen omeprazole (PRILOSEC) 20 MG capsule TAKE 1 CAPSULE DAILY  . rivastigmine (EXELON) 9.5 mg/24hr 9.5 mg daily.     Allergies:   Clonazepam, Morphine and related, Prednisone, and Tramadol   Social  History   Socioeconomic History  . Marital status: Married    Spouse name: Not on file  . Number of children: Not on file  . Years of education: Not on file  . Highest education level: Not on file  Occupational History  . Occupation: disabled    Comment: 2001, [employer]    Comment: search and rescue helicopter  Tobacco Use  . Smoking status: Never Smoker  . Smokeless tobacco: Former Network engineer  . Vaping Use: Never used  Substance and Sexual Activity  . Alcohol use: Yes    Alcohol/week: 0.0 standard drinks    Comment: occasional (twice per month)  . Drug use: No  . Sexual activity: Not Currently  Other Topics Concern  . 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  Prev search and rescue work   3 kids from prev relationship.    Married 2014   Social Determinants of Radio broadcast assistant Strain: Not on file  Food Insecurity: Not on file  Transportation Needs: Not on file  Physical Activity: Not on file  Stress: Not on file  Social Connections: Not on file     Family History: The patient's family history includes Lung cancer in his father. There is no history of Colon cancer or Prostate cancer.  ROS:   Please see the history of present illness.    All other systems reviewed and are negative.  EKGs/Labs/Other Studies Reviewed:    The following studies were reviewed today:  06/12/2020 [facility] personally reviewed HR 37 to 156, average 46bpm. Frequent SVT lasting up to 4:27 with rates up to 156 (average 103). Some junctional rhythm.   Recent Labs: 10/02/2019: BUN 19; Creatinine, Ser 0.85; Hemoglobin 15.3; Platelets 197; Potassium 4.4; Sodium 140  Recent Lipid Panel    Component Value Date/Time   CHOL 140 10/04/2018 1039   CHOL 185 06/03/2013 1051   TRIG 152.0 (H) 10/04/2018 1039   HDL 33.60 (L) 10/04/2018 1039   HDL 35 (L) 06/03/2013 1051   CHOLHDL 4 10/04/2018 1039   VLDL 30.4 10/04/2018 1039   LDLCALC 76  10/04/2018 1039   LDLCALC 127 (H) 06/03/2013 1051    Physical Exam:    VS:  BP 120/72 (BP Location: Left Arm, Patient Position: Sitting, Cuff Size: Normal)   Pulse (!) 52   Ht 5\' 9"  (1.753 m)   Wt 181 lb (82.1 kg)   SpO2 97%   BMI 26.73 kg/m     Wt Readings from Last 3 Encounters:  06/17/20 181 lb (82.1 kg)  06/15/20 185 lb (83.9 kg)  02/18/20 178 lb 8 oz (81 kg)     GEN: Sitting in rolator. Flat affect. Tremor c/w parkinsons HEENT: Normal NECK: No JVD; No carotid bruits LYMPHATICS: No lymphadenopathy CARDIAC: RRR, no murmurs, rubs, gallops RESPIRATORY:  Clear to auscultation without rales, wheezing or rhonchi  ABDOMEN: Soft, non-tender, non-distended  MUSCULOSKELETAL:  No edema; No deformity  SKIN: Warm and dry NEUROLOGIC:  Alert and oriented. Tremor as above c/w parkinson's. PSYCHIATRIC:  Normal affect   ASSESSMENT:    No diagnosis found. PLAN:    In order of problems listed above:  1. Tachycardia-bradycardia syndrome Patient has bradycardia documented on multiple ZIO monitors.  In talking to his wife, it seems that his bradycardia is symptomatic with generalized fatigue although I cannot be sure that this is not related to his Parkinson's.  Given the lack of heart rate variability on a ZIO monitor in the presence of very frequent salvos of SVT, I think it would be reasonable to proceed with permanent pacemaker implant.  This would allow safe initiation of beta-blockade to help address his tachyarrhythmias which I suspect are also is symptomatic from my discussions with the patient and his wife today.  We discussed the implant procedure of pacemaker in detail.  Somewhat complicating the implant would be the history of a pulse generator in the left chest wall related to a left-sided deep brain stimulator that has subsequently been removed.  I would plan on avoiding this pocket to help minimize the risk of infection.  Given the presence of a right-sided pulse generator for his  deep brain stimulator, would plan to wrap the left-sided pacemaker generator with a tyrx envelope.  I discussed the risks and expected recovery in detail with the patient and his wife today in clinic.  They would like to discuss the pacemaker procedure with his neurologist who they have an appointment with next week.  The wife would like to confirm that this procedure would be safe to perform in [place] versus getting it done where his neurology team is at [facility].  From my perspective, I think it would be very reasonable to perform at either location.   I have encouraged him to do whatever the patient and his wife feel most comfortable with.  If they would like to proceed with scheduling his permanent pacemaker implant at [facility] or [facility], they will reach out and we will get it scheduled.  If he does decide to go to [facility], we will obviously send all the records to [facility] and Id be happy to talk to the [facility] EP performing the implant.  2.  Parkinson's post DBS Says his symptoms have improved somewhat with a recent adjustment in his deep brain stimulator.  Medication Adjustments/Labs and Tests Ordered: Current medicines are reviewed at length with the patient today.  Concerns regarding medicines are outlined above.  No orders of the defined types were placed in this encounter.  No orders of the defined types were placed in this encounter.    Signed, Lars Mage, MD, [facility]  06/17/2020 7:14 PM    Electrophysiology [facility]

## 2020-06-17 NOTE — Assessment & Plan Note (Signed)
Parkinson's.  Needs home health OT PT set up.  No change in medications at this point.  He is avoiding tramadol.  Vertigo and fall cautions discussed with patient.  He has cardiology follow-up pending.  I will await that note.

## 2020-06-17 NOTE — Patient Instructions (Addendum)
Medication Instructions:  Your physician recommends that you continue on your current medications as directed. Please refer to the Current Medication list given to you today. *If you need a refill on your cardiac medications before your next appointment, please call your pharmacy*  Lab Work: None ordered. If you have labs (blood work) drawn today and your tests are completely normal, you will receive your results only by: Marland Kitchen MyChart Message (if you have MyChart) OR . A paper copy in the mail If you have any lab test that is abnormal or we need to change your treatment, we will call you to review the results.  Testing/Procedures: Your physician has recommended that you have a pacemaker inserted. A pacemaker is a small device that is placed under the skin of your chest or abdomen to help control abnormal heart rhythms. This device uses electrical pulses to prompt the heart to beat at a normal rate. Pacemakers are used to treat heart rhythms that are too slow. Wire (leads) are attached to the pacemaker that goes into the chambers of you heart. This is done in the hospital and usually requires and overnight stay. Please see the instruction sheet given to you today for more information.  If you decide you would like to proceed with scheduling a pacemaker please contact:  Sonia Baller RN via MyChart or you can call the office and ask for me.  Follow-Up: At [facility], you and your health needs are our priority.  As part of our continuing mission to provide you with exceptional heart care, we have created designated Provider Care Teams.  These Care Teams include your primary Cardiologist (physician) and Advanced Practice Providers (APPs -  Physician Assistants and Nurse Practitioners) who all work together to provide you with the care you need, when you need it.  Your next appointment:   Your physician wants you to follow-up in: as needed with Dr. Quentin Ore.      Pacemaker Implantation, Adult Pacemaker  implantation is a procedure to place a pacemaker inside the chest. A pacemaker is a small computer that sends electrical signals to the heart and helps the heart beat normally. A pacemaker also stores information about heart rhythms. You may need pacemaker implantation if you have:  A slow heartbeat (bradycardia).  Loss of consciousness that happens repeatedly (syncope) or repeated episodes of dizziness or light-headedness because of an irregular heart rate.  Shortness of breath (dyspnea) due to heart problems. The pacemaker usually attaches to your heart through a wire called a lead. One or two leads may be needed. There are different types of pacemakers:  Transvenous pacemaker. This type is placed under the skin or muscle of your upper chest area. The lead goes through a vein in the chest area to reach the inside of the heart.  Epicardial pacemaker. This type is placed under the skin or muscle of your chest or abdomen. The lead goes through your chest to the outside of the heart. Tell a health care provider about:  Any allergies you have.  All medicines you are taking, including vitamins, herbs, eye drops, creams, and over-the-counter medicines.  Any problems you or family members have had with anesthetic medicines.  Any blood or bone disorders you have.  Any surgeries you have had.  Any medical conditions you have.  Whether you are pregnant or may be pregnant. What are the risks? Generally, this is a safe procedure. However, problems may occur, including:  Infection.  Bleeding.  Failure of the pacemaker or the  lead.  Collapse of a lung or bleeding into a lung.  Blood clot inside a blood vessel with a lead.  Damage to the heart.  Infection inside the heart (endocarditis).  Allergic reactions to medicines. What happens before the procedure? Staying hydrated Follow instructions from your health care provider about hydration, which may include:  Up to 2 hours before  the procedure - you may continue to drink clear liquids, such as water, clear fruit juice, black coffee, and plain tea.   Eating and drinking restrictions Follow instructions from your health care provider about eating and drinking, which may include:  8 hours before the procedure - stop eating heavy meals or foods, such as meat, fried foods, or fatty foods.  6 hours before the procedure - stop eating light meals or foods, such as toast or cereal.  6 hours before the procedure - stop drinking milk or drinks that contain milk.  2 hours before the procedure - stop drinking clear liquids. Medicines Ask your health care provider about:  Changing or stopping your regular medicines. This is especially important if you are taking diabetes medicines or blood thinners.  Taking medicines such as aspirin and ibuprofen. These medicines can thin your blood. Do not take these medicines unless your health care provider tells you to take them.  Taking over-the-counter medicines, vitamins, herbs, and supplements. Tests You may have:  A heart evaluation. This may include: ? An electrocardiogram (ECG). This involves placing patches on your skin to check your heart rhythm. ? A chest X-ray. ? An echocardiogram. This is a test that uses sound waves (ultrasound) to produce an image of the heart. ? A cardiac rhythm monitor. This is used to record your heart rhythm and any events for a longer period of time.  Blood tests.  Genetic testing. General instructions  Do not use any products that contain nicotine or tobacco for at least 4 weeks before the procedure. These products include cigarettes, e-cigarettes, and chewing tobacco. If you need help quitting, ask your health care provider.  Ask your health care provider: ? How your surgery site will be marked. ? What steps will be taken to help prevent infection. These steps may include:  Removing hair at the surgery site.  Washing skin with a  germ-killing soap.  Receiving antibiotic medicine.  Plan to have someone take you home from the hospital or clinic.  If you will be going home right after the procedure, plan to have someone with you for 24 hours. What happens during the procedure?  An IV will be inserted into one of your veins.  You will be given one or more of the following: ? A medicine to help you relax (sedative). ? A medicine to numb the area (local anesthetic). ? A medicine to make you fall asleep (general anesthetic).  The next steps vary depending on the type of pacemaker you will be getting. ? If you are getting a transvenous pacemaker:  An incision will be made in your upper chest.  A pocket will be made for the pacemaker. It may be placed under the skin or between layers of muscle.  The lead will be inserted into a blood vessel that goes to the heart.  While X-rays are taken by an imaging machine (fluoroscopy), the lead will be advanced through the vein to the inside of your heart.  The other end of the lead will be tunneled under the skin and attached to the pacemaker. ? If you are getting  an epicardial pacemaker:  An incision will be made near your ribs or breastbone (sternum) for the lead.  The lead will be attached to the outside of your heart.  Another incision will be made in your chest or upper abdomen to create a pocket for the pacemaker.  The free end of the lead will be tunneled under the skin and attached to the pacemaker.  The transvenous or epicardial pacemaker will be tested. Imaging studies may be done to check the lead position.  The incisions will be closed with stitches (sutures), adhesive strips, or skin glue.  Bandages (dressings) will be placed over the incisions. The procedure may vary among health care providers and hospitals. What happens after the procedure?  Your blood pressure, heart rate, breathing rate, and blood oxygen level will be monitored until you leave the  hospital or clinic.  You may be given antibiotics.  You will be given pain medicine.  An ECG and chest X-rays will be done.  You may need to wear a continuous type of ECG (Holter monitor) to check your heart rhythm.  Your health care provider will program the pacemaker.  If you were given a sedative during the procedure, it can affect you for several hours. Do not drive or operate machinery until your health care provider says that it is safe.  You will be given a pacemaker identification card. This card lists the implant date, device model, and manufacturer of your pacemaker. Summary  A pacemaker is a small computer that sends electrical signals to the heart and helps the heart beat normally.  There are different types of pacemakers. A pacemaker may be placed under the skin or muscle of your chest or abdomen.  Follow instructions from your health care provider about eating and drinking and about taking medicines before the procedure. This information is not intended to replace advice given to you by your health care provider. Make sure you discuss any questions you have with your health care provider. Document Revised: 03/06/2019 Document Reviewed: 03/06/2019 Elsevier Patient Education  2021 [employer].

## 2020-06-22 ENCOUNTER — Encounter: Payer: Medicare PPO | Admitting: Occupational Therapy

## 2020-06-22 ENCOUNTER — Ambulatory Visit: Payer: Medicare PPO

## 2020-06-22 ENCOUNTER — Ambulatory Visit (INDEPENDENT_AMBULATORY_CARE_PROVIDER_SITE_OTHER): Payer: Medicare PPO | Admitting: Psychology

## 2020-06-22 DIAGNOSIS — F4323 Adjustment disorder with mixed anxiety and depressed mood: Secondary | ICD-10-CM

## 2020-06-22 DIAGNOSIS — F331 Major depressive disorder, recurrent, moderate: Secondary | ICD-10-CM | POA: Diagnosis not present

## 2020-06-24 ENCOUNTER — Ambulatory Visit: Payer: Medicare PPO

## 2020-06-24 ENCOUNTER — Encounter: Payer: Medicare PPO | Admitting: Occupational Therapy

## 2020-06-29 ENCOUNTER — Ambulatory Visit: Payer: Medicare PPO

## 2020-06-29 ENCOUNTER — Encounter: Payer: Medicare PPO | Admitting: Occupational Therapy

## 2020-06-30 DIAGNOSIS — R001 Bradycardia, unspecified: Secondary | ICD-10-CM | POA: Diagnosis not present

## 2020-06-30 DIAGNOSIS — G20 Parkinson's disease: Secondary | ICD-10-CM | POA: Diagnosis not present

## 2020-06-30 DIAGNOSIS — Z9689 Presence of other specified functional implants: Secondary | ICD-10-CM | POA: Diagnosis not present

## 2020-07-01 ENCOUNTER — Ambulatory Visit: Payer: Medicare PPO

## 2020-07-01 ENCOUNTER — Encounter: Payer: Medicare PPO | Admitting: Occupational Therapy

## 2020-07-02 DIAGNOSIS — I495 Sick sinus syndrome: Secondary | ICD-10-CM | POA: Diagnosis not present

## 2020-07-02 DIAGNOSIS — G20 Parkinson's disease: Secondary | ICD-10-CM | POA: Diagnosis not present

## 2020-07-02 DIAGNOSIS — R001 Bradycardia, unspecified: Secondary | ICD-10-CM | POA: Diagnosis not present

## 2020-07-03 DIAGNOSIS — E785 Hyperlipidemia, unspecified: Secondary | ICD-10-CM | POA: Diagnosis not present

## 2020-07-03 DIAGNOSIS — G20 Parkinson's disease: Secondary | ICD-10-CM | POA: Diagnosis not present

## 2020-07-03 DIAGNOSIS — F4310 Post-traumatic stress disorder, unspecified: Secondary | ICD-10-CM | POA: Diagnosis not present

## 2020-07-03 DIAGNOSIS — M48062 Spinal stenosis, lumbar region with neurogenic claudication: Secondary | ICD-10-CM | POA: Diagnosis not present

## 2020-07-03 DIAGNOSIS — F32A Depression, unspecified: Secondary | ICD-10-CM | POA: Diagnosis not present

## 2020-07-03 DIAGNOSIS — F0280 Dementia in other diseases classified elsewhere without behavioral disturbance: Secondary | ICD-10-CM | POA: Diagnosis not present

## 2020-07-03 DIAGNOSIS — R42 Dizziness and giddiness: Secondary | ICD-10-CM | POA: Diagnosis not present

## 2020-07-03 DIAGNOSIS — R001 Bradycardia, unspecified: Secondary | ICD-10-CM | POA: Diagnosis not present

## 2020-07-03 DIAGNOSIS — I10 Essential (primary) hypertension: Secondary | ICD-10-CM | POA: Diagnosis not present

## 2020-07-06 ENCOUNTER — Ambulatory Visit: Payer: Medicare PPO

## 2020-07-06 ENCOUNTER — Encounter: Payer: Medicare PPO | Admitting: Occupational Therapy

## 2020-07-06 ENCOUNTER — Ambulatory Visit (INDEPENDENT_AMBULATORY_CARE_PROVIDER_SITE_OTHER): Payer: Medicare PPO | Admitting: Psychology

## 2020-07-06 DIAGNOSIS — F4323 Adjustment disorder with mixed anxiety and depressed mood: Secondary | ICD-10-CM

## 2020-07-06 DIAGNOSIS — F331 Major depressive disorder, recurrent, moderate: Secondary | ICD-10-CM

## 2020-07-07 ENCOUNTER — Telehealth: Payer: Self-pay | Admitting: *Deleted

## 2020-07-07 DIAGNOSIS — G20 Parkinson's disease: Secondary | ICD-10-CM | POA: Diagnosis not present

## 2020-07-07 DIAGNOSIS — F32A Depression, unspecified: Secondary | ICD-10-CM | POA: Diagnosis not present

## 2020-07-07 DIAGNOSIS — R001 Bradycardia, unspecified: Secondary | ICD-10-CM | POA: Diagnosis not present

## 2020-07-07 DIAGNOSIS — E785 Hyperlipidemia, unspecified: Secondary | ICD-10-CM | POA: Diagnosis not present

## 2020-07-07 DIAGNOSIS — R42 Dizziness and giddiness: Secondary | ICD-10-CM | POA: Diagnosis not present

## 2020-07-07 DIAGNOSIS — I10 Essential (primary) hypertension: Secondary | ICD-10-CM | POA: Diagnosis not present

## 2020-07-07 DIAGNOSIS — M48062 Spinal stenosis, lumbar region with neurogenic claudication: Secondary | ICD-10-CM | POA: Diagnosis not present

## 2020-07-07 DIAGNOSIS — F4310 Post-traumatic stress disorder, unspecified: Secondary | ICD-10-CM | POA: Diagnosis not present

## 2020-07-07 DIAGNOSIS — F0280 Dementia in other diseases classified elsewhere without behavioral disturbance: Secondary | ICD-10-CM | POA: Diagnosis not present

## 2020-07-07 NOTE — Telephone Encounter (Signed)
[name], PT and gave verbal orders.

## 2020-07-07 NOTE — Telephone Encounter (Signed)
Please give the order.  Thanks.   

## 2020-07-07 NOTE — Telephone Encounter (Signed)
Skeet Simmer PT with [facility] left a voicemail stating that he would like verbal orders for PT for patient. Skeet Simmer stated that he would like orders for one x one week for evaluation and start of care. Skeet Simmer stated that he would also like PT for twice a week for four weeks to work on balance, gait training, fall prevention and home exercises.

## 2020-07-08 ENCOUNTER — Encounter: Payer: Medicare PPO | Admitting: Occupational Therapy

## 2020-07-08 ENCOUNTER — Ambulatory Visit: Payer: Medicare PPO

## 2020-07-08 DIAGNOSIS — M48062 Spinal stenosis, lumbar region with neurogenic claudication: Secondary | ICD-10-CM | POA: Diagnosis not present

## 2020-07-08 DIAGNOSIS — E785 Hyperlipidemia, unspecified: Secondary | ICD-10-CM | POA: Diagnosis not present

## 2020-07-08 DIAGNOSIS — R42 Dizziness and giddiness: Secondary | ICD-10-CM | POA: Diagnosis not present

## 2020-07-08 DIAGNOSIS — R001 Bradycardia, unspecified: Secondary | ICD-10-CM | POA: Diagnosis not present

## 2020-07-08 DIAGNOSIS — F0280 Dementia in other diseases classified elsewhere without behavioral disturbance: Secondary | ICD-10-CM | POA: Diagnosis not present

## 2020-07-08 DIAGNOSIS — I10 Essential (primary) hypertension: Secondary | ICD-10-CM | POA: Diagnosis not present

## 2020-07-08 DIAGNOSIS — F4310 Post-traumatic stress disorder, unspecified: Secondary | ICD-10-CM | POA: Diagnosis not present

## 2020-07-08 DIAGNOSIS — F32A Depression, unspecified: Secondary | ICD-10-CM | POA: Diagnosis not present

## 2020-07-08 DIAGNOSIS — G20 Parkinson's disease: Secondary | ICD-10-CM | POA: Diagnosis not present

## 2020-07-09 DIAGNOSIS — R001 Bradycardia, unspecified: Secondary | ICD-10-CM | POA: Diagnosis not present

## 2020-07-13 ENCOUNTER — Ambulatory Visit: Payer: Medicare PPO

## 2020-07-13 ENCOUNTER — Encounter: Payer: Medicare PPO | Admitting: Occupational Therapy

## 2020-07-15 ENCOUNTER — Ambulatory Visit: Payer: Medicare PPO

## 2020-07-15 ENCOUNTER — Encounter: Payer: Medicare PPO | Admitting: Occupational Therapy

## 2020-07-15 DIAGNOSIS — I10 Essential (primary) hypertension: Secondary | ICD-10-CM | POA: Diagnosis not present

## 2020-07-15 DIAGNOSIS — E785 Hyperlipidemia, unspecified: Secondary | ICD-10-CM | POA: Diagnosis not present

## 2020-07-15 DIAGNOSIS — R42 Dizziness and giddiness: Secondary | ICD-10-CM | POA: Diagnosis not present

## 2020-07-15 DIAGNOSIS — F0280 Dementia in other diseases classified elsewhere without behavioral disturbance: Secondary | ICD-10-CM | POA: Diagnosis not present

## 2020-07-15 DIAGNOSIS — R001 Bradycardia, unspecified: Secondary | ICD-10-CM | POA: Diagnosis not present

## 2020-07-15 DIAGNOSIS — F4310 Post-traumatic stress disorder, unspecified: Secondary | ICD-10-CM | POA: Diagnosis not present

## 2020-07-15 DIAGNOSIS — G20 Parkinson's disease: Secondary | ICD-10-CM | POA: Diagnosis not present

## 2020-07-15 DIAGNOSIS — M48062 Spinal stenosis, lumbar region with neurogenic claudication: Secondary | ICD-10-CM | POA: Diagnosis not present

## 2020-07-15 DIAGNOSIS — F32A Depression, unspecified: Secondary | ICD-10-CM | POA: Diagnosis not present

## 2020-07-17 DIAGNOSIS — E785 Hyperlipidemia, unspecified: Secondary | ICD-10-CM | POA: Diagnosis not present

## 2020-07-17 DIAGNOSIS — R001 Bradycardia, unspecified: Secondary | ICD-10-CM | POA: Diagnosis not present

## 2020-07-17 DIAGNOSIS — M48062 Spinal stenosis, lumbar region with neurogenic claudication: Secondary | ICD-10-CM | POA: Diagnosis not present

## 2020-07-17 DIAGNOSIS — I10 Essential (primary) hypertension: Secondary | ICD-10-CM | POA: Diagnosis not present

## 2020-07-17 DIAGNOSIS — G20 Parkinson's disease: Secondary | ICD-10-CM | POA: Diagnosis not present

## 2020-07-17 DIAGNOSIS — F0280 Dementia in other diseases classified elsewhere without behavioral disturbance: Secondary | ICD-10-CM | POA: Diagnosis not present

## 2020-07-17 DIAGNOSIS — F4310 Post-traumatic stress disorder, unspecified: Secondary | ICD-10-CM | POA: Diagnosis not present

## 2020-07-17 DIAGNOSIS — F32A Depression, unspecified: Secondary | ICD-10-CM | POA: Diagnosis not present

## 2020-07-17 DIAGNOSIS — R42 Dizziness and giddiness: Secondary | ICD-10-CM | POA: Diagnosis not present

## 2020-07-19 DIAGNOSIS — K219 Gastro-esophageal reflux disease without esophagitis: Secondary | ICD-10-CM

## 2020-07-19 DIAGNOSIS — M48062 Spinal stenosis, lumbar region with neurogenic claudication: Secondary | ICD-10-CM | POA: Diagnosis not present

## 2020-07-19 DIAGNOSIS — F0280 Dementia in other diseases classified elsewhere without behavioral disturbance: Secondary | ICD-10-CM | POA: Diagnosis not present

## 2020-07-19 DIAGNOSIS — R001 Bradycardia, unspecified: Secondary | ICD-10-CM | POA: Diagnosis not present

## 2020-07-19 DIAGNOSIS — F32A Depression, unspecified: Secondary | ICD-10-CM | POA: Diagnosis not present

## 2020-07-19 DIAGNOSIS — Z9682 Presence of neurostimulator: Secondary | ICD-10-CM

## 2020-07-19 DIAGNOSIS — E785 Hyperlipidemia, unspecified: Secondary | ICD-10-CM | POA: Diagnosis not present

## 2020-07-19 DIAGNOSIS — Z791 Long term (current) use of non-steroidal anti-inflammatories (NSAID): Secondary | ICD-10-CM

## 2020-07-19 DIAGNOSIS — I10 Essential (primary) hypertension: Secondary | ICD-10-CM | POA: Diagnosis not present

## 2020-07-19 DIAGNOSIS — F4310 Post-traumatic stress disorder, unspecified: Secondary | ICD-10-CM | POA: Diagnosis not present

## 2020-07-19 DIAGNOSIS — R42 Dizziness and giddiness: Secondary | ICD-10-CM | POA: Diagnosis not present

## 2020-07-19 DIAGNOSIS — G20 Parkinson's disease: Secondary | ICD-10-CM | POA: Diagnosis not present

## 2020-07-19 DIAGNOSIS — Z9181 History of falling: Secondary | ICD-10-CM

## 2020-07-19 DIAGNOSIS — W19XXXD Unspecified fall, subsequent encounter: Secondary | ICD-10-CM

## 2020-07-19 DIAGNOSIS — Z7982 Long term (current) use of aspirin: Secondary | ICD-10-CM

## 2020-07-20 ENCOUNTER — Ambulatory Visit: Payer: Medicare PPO

## 2020-07-20 ENCOUNTER — Ambulatory Visit (INDEPENDENT_AMBULATORY_CARE_PROVIDER_SITE_OTHER): Payer: Medicare PPO | Admitting: Psychology

## 2020-07-20 ENCOUNTER — Encounter: Payer: Medicare PPO | Admitting: Occupational Therapy

## 2020-07-20 DIAGNOSIS — R001 Bradycardia, unspecified: Secondary | ICD-10-CM | POA: Diagnosis not present

## 2020-07-20 DIAGNOSIS — F4310 Post-traumatic stress disorder, unspecified: Secondary | ICD-10-CM | POA: Diagnosis not present

## 2020-07-20 DIAGNOSIS — E785 Hyperlipidemia, unspecified: Secondary | ICD-10-CM | POA: Diagnosis not present

## 2020-07-20 DIAGNOSIS — M48062 Spinal stenosis, lumbar region with neurogenic claudication: Secondary | ICD-10-CM | POA: Diagnosis not present

## 2020-07-20 DIAGNOSIS — F4323 Adjustment disorder with mixed anxiety and depressed mood: Secondary | ICD-10-CM

## 2020-07-20 DIAGNOSIS — F331 Major depressive disorder, recurrent, moderate: Secondary | ICD-10-CM

## 2020-07-20 DIAGNOSIS — I10 Essential (primary) hypertension: Secondary | ICD-10-CM | POA: Diagnosis not present

## 2020-07-20 DIAGNOSIS — F0280 Dementia in other diseases classified elsewhere without behavioral disturbance: Secondary | ICD-10-CM | POA: Diagnosis not present

## 2020-07-20 DIAGNOSIS — G20 Parkinson's disease: Secondary | ICD-10-CM | POA: Diagnosis not present

## 2020-07-20 DIAGNOSIS — R42 Dizziness and giddiness: Secondary | ICD-10-CM | POA: Diagnosis not present

## 2020-07-20 DIAGNOSIS — F32A Depression, unspecified: Secondary | ICD-10-CM | POA: Diagnosis not present

## 2020-07-21 DIAGNOSIS — M48062 Spinal stenosis, lumbar region with neurogenic claudication: Secondary | ICD-10-CM | POA: Diagnosis not present

## 2020-07-21 DIAGNOSIS — E785 Hyperlipidemia, unspecified: Secondary | ICD-10-CM | POA: Diagnosis not present

## 2020-07-21 DIAGNOSIS — G20 Parkinson's disease: Secondary | ICD-10-CM | POA: Diagnosis not present

## 2020-07-21 DIAGNOSIS — F32A Depression, unspecified: Secondary | ICD-10-CM | POA: Diagnosis not present

## 2020-07-21 DIAGNOSIS — F0280 Dementia in other diseases classified elsewhere without behavioral disturbance: Secondary | ICD-10-CM | POA: Diagnosis not present

## 2020-07-21 DIAGNOSIS — I10 Essential (primary) hypertension: Secondary | ICD-10-CM | POA: Diagnosis not present

## 2020-07-21 DIAGNOSIS — R42 Dizziness and giddiness: Secondary | ICD-10-CM | POA: Diagnosis not present

## 2020-07-21 DIAGNOSIS — F4310 Post-traumatic stress disorder, unspecified: Secondary | ICD-10-CM | POA: Diagnosis not present

## 2020-07-21 DIAGNOSIS — R001 Bradycardia, unspecified: Secondary | ICD-10-CM | POA: Diagnosis not present

## 2020-07-23 DIAGNOSIS — I10 Essential (primary) hypertension: Secondary | ICD-10-CM | POA: Diagnosis not present

## 2020-07-23 DIAGNOSIS — R42 Dizziness and giddiness: Secondary | ICD-10-CM | POA: Diagnosis not present

## 2020-07-23 DIAGNOSIS — F0280 Dementia in other diseases classified elsewhere without behavioral disturbance: Secondary | ICD-10-CM | POA: Diagnosis not present

## 2020-07-23 DIAGNOSIS — E785 Hyperlipidemia, unspecified: Secondary | ICD-10-CM | POA: Diagnosis not present

## 2020-07-23 DIAGNOSIS — G20 Parkinson's disease: Secondary | ICD-10-CM | POA: Diagnosis not present

## 2020-07-23 DIAGNOSIS — M48062 Spinal stenosis, lumbar region with neurogenic claudication: Secondary | ICD-10-CM | POA: Diagnosis not present

## 2020-07-23 DIAGNOSIS — R001 Bradycardia, unspecified: Secondary | ICD-10-CM | POA: Diagnosis not present

## 2020-07-23 DIAGNOSIS — F4310 Post-traumatic stress disorder, unspecified: Secondary | ICD-10-CM | POA: Diagnosis not present

## 2020-07-23 DIAGNOSIS — F32A Depression, unspecified: Secondary | ICD-10-CM | POA: Diagnosis not present

## 2020-07-28 DIAGNOSIS — E785 Hyperlipidemia, unspecified: Secondary | ICD-10-CM | POA: Diagnosis not present

## 2020-07-28 DIAGNOSIS — F0280 Dementia in other diseases classified elsewhere without behavioral disturbance: Secondary | ICD-10-CM | POA: Diagnosis not present

## 2020-07-28 DIAGNOSIS — G20 Parkinson's disease: Secondary | ICD-10-CM | POA: Diagnosis not present

## 2020-07-28 DIAGNOSIS — F32A Depression, unspecified: Secondary | ICD-10-CM | POA: Diagnosis not present

## 2020-07-28 DIAGNOSIS — M48062 Spinal stenosis, lumbar region with neurogenic claudication: Secondary | ICD-10-CM | POA: Diagnosis not present

## 2020-07-28 DIAGNOSIS — F4310 Post-traumatic stress disorder, unspecified: Secondary | ICD-10-CM | POA: Diagnosis not present

## 2020-07-28 DIAGNOSIS — I10 Essential (primary) hypertension: Secondary | ICD-10-CM | POA: Diagnosis not present

## 2020-07-28 DIAGNOSIS — R001 Bradycardia, unspecified: Secondary | ICD-10-CM | POA: Diagnosis not present

## 2020-07-28 DIAGNOSIS — R42 Dizziness and giddiness: Secondary | ICD-10-CM | POA: Diagnosis not present

## 2020-07-29 DIAGNOSIS — M47896 Other spondylosis, lumbar region: Secondary | ICD-10-CM | POA: Diagnosis not present

## 2020-07-29 DIAGNOSIS — M5416 Radiculopathy, lumbar region: Secondary | ICD-10-CM | POA: Diagnosis not present

## 2020-07-29 DIAGNOSIS — Z79899 Other long term (current) drug therapy: Secondary | ICD-10-CM | POA: Diagnosis not present

## 2020-07-29 DIAGNOSIS — G894 Chronic pain syndrome: Secondary | ICD-10-CM | POA: Diagnosis not present

## 2020-07-30 DIAGNOSIS — R001 Bradycardia, unspecified: Secondary | ICD-10-CM | POA: Diagnosis not present

## 2020-07-30 DIAGNOSIS — F0280 Dementia in other diseases classified elsewhere without behavioral disturbance: Secondary | ICD-10-CM | POA: Diagnosis not present

## 2020-07-30 DIAGNOSIS — R42 Dizziness and giddiness: Secondary | ICD-10-CM | POA: Diagnosis not present

## 2020-07-30 DIAGNOSIS — M48062 Spinal stenosis, lumbar region with neurogenic claudication: Secondary | ICD-10-CM | POA: Diagnosis not present

## 2020-07-30 DIAGNOSIS — F32A Depression, unspecified: Secondary | ICD-10-CM | POA: Diagnosis not present

## 2020-07-30 DIAGNOSIS — I10 Essential (primary) hypertension: Secondary | ICD-10-CM | POA: Diagnosis not present

## 2020-07-30 DIAGNOSIS — F4310 Post-traumatic stress disorder, unspecified: Secondary | ICD-10-CM | POA: Diagnosis not present

## 2020-07-30 DIAGNOSIS — E785 Hyperlipidemia, unspecified: Secondary | ICD-10-CM | POA: Diagnosis not present

## 2020-07-30 DIAGNOSIS — G20 Parkinson's disease: Secondary | ICD-10-CM | POA: Diagnosis not present

## 2020-08-05 DIAGNOSIS — F4310 Post-traumatic stress disorder, unspecified: Secondary | ICD-10-CM | POA: Diagnosis not present

## 2020-08-05 DIAGNOSIS — G20 Parkinson's disease: Secondary | ICD-10-CM | POA: Diagnosis not present

## 2020-08-05 DIAGNOSIS — R42 Dizziness and giddiness: Secondary | ICD-10-CM | POA: Diagnosis not present

## 2020-08-05 DIAGNOSIS — R001 Bradycardia, unspecified: Secondary | ICD-10-CM | POA: Diagnosis not present

## 2020-08-05 DIAGNOSIS — I10 Essential (primary) hypertension: Secondary | ICD-10-CM | POA: Diagnosis not present

## 2020-08-05 DIAGNOSIS — F0280 Dementia in other diseases classified elsewhere without behavioral disturbance: Secondary | ICD-10-CM | POA: Diagnosis not present

## 2020-08-05 DIAGNOSIS — M48062 Spinal stenosis, lumbar region with neurogenic claudication: Secondary | ICD-10-CM | POA: Diagnosis not present

## 2020-08-05 DIAGNOSIS — F32A Depression, unspecified: Secondary | ICD-10-CM | POA: Diagnosis not present

## 2020-08-05 DIAGNOSIS — E785 Hyperlipidemia, unspecified: Secondary | ICD-10-CM | POA: Diagnosis not present

## 2020-08-08 DIAGNOSIS — M48062 Spinal stenosis, lumbar region with neurogenic claudication: Secondary | ICD-10-CM | POA: Diagnosis not present

## 2020-08-08 DIAGNOSIS — R001 Bradycardia, unspecified: Secondary | ICD-10-CM | POA: Diagnosis not present

## 2020-08-08 DIAGNOSIS — I10 Essential (primary) hypertension: Secondary | ICD-10-CM | POA: Diagnosis not present

## 2020-08-08 DIAGNOSIS — G20 Parkinson's disease: Secondary | ICD-10-CM | POA: Diagnosis not present

## 2020-08-08 DIAGNOSIS — R42 Dizziness and giddiness: Secondary | ICD-10-CM | POA: Diagnosis not present

## 2020-08-08 DIAGNOSIS — F32A Depression, unspecified: Secondary | ICD-10-CM | POA: Diagnosis not present

## 2020-08-08 DIAGNOSIS — F4310 Post-traumatic stress disorder, unspecified: Secondary | ICD-10-CM | POA: Diagnosis not present

## 2020-08-08 DIAGNOSIS — E785 Hyperlipidemia, unspecified: Secondary | ICD-10-CM | POA: Diagnosis not present

## 2020-08-08 DIAGNOSIS — F0280 Dementia in other diseases classified elsewhere without behavioral disturbance: Secondary | ICD-10-CM | POA: Diagnosis not present

## 2020-08-11 DIAGNOSIS — I10 Essential (primary) hypertension: Secondary | ICD-10-CM | POA: Diagnosis not present

## 2020-08-11 DIAGNOSIS — M48062 Spinal stenosis, lumbar region with neurogenic claudication: Secondary | ICD-10-CM | POA: Diagnosis not present

## 2020-08-11 DIAGNOSIS — E785 Hyperlipidemia, unspecified: Secondary | ICD-10-CM | POA: Diagnosis not present

## 2020-08-11 DIAGNOSIS — G20 Parkinson's disease: Secondary | ICD-10-CM | POA: Diagnosis not present

## 2020-08-11 DIAGNOSIS — F4310 Post-traumatic stress disorder, unspecified: Secondary | ICD-10-CM | POA: Diagnosis not present

## 2020-08-11 DIAGNOSIS — R42 Dizziness and giddiness: Secondary | ICD-10-CM | POA: Diagnosis not present

## 2020-08-11 DIAGNOSIS — F0280 Dementia in other diseases classified elsewhere without behavioral disturbance: Secondary | ICD-10-CM | POA: Diagnosis not present

## 2020-08-11 DIAGNOSIS — R001 Bradycardia, unspecified: Secondary | ICD-10-CM | POA: Diagnosis not present

## 2020-08-11 DIAGNOSIS — F32A Depression, unspecified: Secondary | ICD-10-CM | POA: Diagnosis not present

## 2020-08-13 ENCOUNTER — Other Ambulatory Visit: Payer: Self-pay | Admitting: Family Medicine

## 2020-08-13 DIAGNOSIS — G20 Parkinson's disease: Secondary | ICD-10-CM | POA: Diagnosis not present

## 2020-08-13 DIAGNOSIS — R42 Dizziness and giddiness: Secondary | ICD-10-CM | POA: Diagnosis not present

## 2020-08-13 DIAGNOSIS — I10 Essential (primary) hypertension: Secondary | ICD-10-CM | POA: Diagnosis not present

## 2020-08-13 DIAGNOSIS — F0280 Dementia in other diseases classified elsewhere without behavioral disturbance: Secondary | ICD-10-CM | POA: Diagnosis not present

## 2020-08-13 DIAGNOSIS — F32A Depression, unspecified: Secondary | ICD-10-CM | POA: Diagnosis not present

## 2020-08-13 DIAGNOSIS — E785 Hyperlipidemia, unspecified: Secondary | ICD-10-CM | POA: Diagnosis not present

## 2020-08-13 DIAGNOSIS — M48062 Spinal stenosis, lumbar region with neurogenic claudication: Secondary | ICD-10-CM | POA: Diagnosis not present

## 2020-08-13 DIAGNOSIS — R001 Bradycardia, unspecified: Secondary | ICD-10-CM | POA: Diagnosis not present

## 2020-08-13 DIAGNOSIS — F4310 Post-traumatic stress disorder, unspecified: Secondary | ICD-10-CM | POA: Diagnosis not present

## 2020-08-17 ENCOUNTER — Ambulatory Visit (INDEPENDENT_AMBULATORY_CARE_PROVIDER_SITE_OTHER): Payer: Medicare PPO | Admitting: Psychology

## 2020-08-17 DIAGNOSIS — F331 Major depressive disorder, recurrent, moderate: Secondary | ICD-10-CM | POA: Diagnosis not present

## 2020-08-17 DIAGNOSIS — F4323 Adjustment disorder with mixed anxiety and depressed mood: Secondary | ICD-10-CM

## 2020-08-18 DIAGNOSIS — R001 Bradycardia, unspecified: Secondary | ICD-10-CM | POA: Diagnosis not present

## 2020-08-18 DIAGNOSIS — I10 Essential (primary) hypertension: Secondary | ICD-10-CM | POA: Diagnosis not present

## 2020-08-18 DIAGNOSIS — E785 Hyperlipidemia, unspecified: Secondary | ICD-10-CM | POA: Diagnosis not present

## 2020-08-18 DIAGNOSIS — G20 Parkinson's disease: Secondary | ICD-10-CM | POA: Diagnosis not present

## 2020-08-18 DIAGNOSIS — F4310 Post-traumatic stress disorder, unspecified: Secondary | ICD-10-CM | POA: Diagnosis not present

## 2020-08-18 DIAGNOSIS — F0280 Dementia in other diseases classified elsewhere without behavioral disturbance: Secondary | ICD-10-CM | POA: Diagnosis not present

## 2020-08-18 DIAGNOSIS — M48062 Spinal stenosis, lumbar region with neurogenic claudication: Secondary | ICD-10-CM | POA: Diagnosis not present

## 2020-08-18 DIAGNOSIS — R42 Dizziness and giddiness: Secondary | ICD-10-CM | POA: Diagnosis not present

## 2020-08-18 DIAGNOSIS — F32A Depression, unspecified: Secondary | ICD-10-CM | POA: Diagnosis not present

## 2020-08-21 DIAGNOSIS — I10 Essential (primary) hypertension: Secondary | ICD-10-CM | POA: Diagnosis not present

## 2020-08-21 DIAGNOSIS — I495 Sick sinus syndrome: Secondary | ICD-10-CM | POA: Diagnosis not present

## 2020-08-21 DIAGNOSIS — G20 Parkinson's disease: Secondary | ICD-10-CM | POA: Diagnosis not present

## 2020-08-21 DIAGNOSIS — F419 Anxiety disorder, unspecified: Secondary | ICD-10-CM | POA: Diagnosis not present

## 2020-08-21 DIAGNOSIS — I119 Hypertensive heart disease without heart failure: Secondary | ICD-10-CM | POA: Diagnosis not present

## 2020-08-21 DIAGNOSIS — K219 Gastro-esophageal reflux disease without esophagitis: Secondary | ICD-10-CM | POA: Diagnosis not present

## 2020-08-21 DIAGNOSIS — G4733 Obstructive sleep apnea (adult) (pediatric): Secondary | ICD-10-CM | POA: Diagnosis not present

## 2020-08-21 DIAGNOSIS — I77810 Thoracic aortic ectasia: Secondary | ICD-10-CM | POA: Diagnosis not present

## 2020-08-21 DIAGNOSIS — R001 Bradycardia, unspecified: Secondary | ICD-10-CM | POA: Diagnosis not present

## 2020-08-21 DIAGNOSIS — F32A Depression, unspecified: Secondary | ICD-10-CM | POA: Diagnosis not present

## 2020-08-21 DIAGNOSIS — I080 Rheumatic disorders of both mitral and aortic valves: Secondary | ICD-10-CM | POA: Diagnosis not present

## 2020-08-22 DIAGNOSIS — I080 Rheumatic disorders of both mitral and aortic valves: Secondary | ICD-10-CM | POA: Diagnosis not present

## 2020-08-22 DIAGNOSIS — I77810 Thoracic aortic ectasia: Secondary | ICD-10-CM | POA: Diagnosis not present

## 2020-08-22 DIAGNOSIS — G20 Parkinson's disease: Secondary | ICD-10-CM | POA: Diagnosis not present

## 2020-08-22 DIAGNOSIS — K219 Gastro-esophageal reflux disease without esophagitis: Secondary | ICD-10-CM | POA: Diagnosis not present

## 2020-08-22 DIAGNOSIS — I495 Sick sinus syndrome: Secondary | ICD-10-CM | POA: Diagnosis not present

## 2020-08-22 DIAGNOSIS — I119 Hypertensive heart disease without heart failure: Secondary | ICD-10-CM | POA: Diagnosis not present

## 2020-08-22 DIAGNOSIS — R001 Bradycardia, unspecified: Secondary | ICD-10-CM | POA: Diagnosis not present

## 2020-08-22 DIAGNOSIS — G4733 Obstructive sleep apnea (adult) (pediatric): Secondary | ICD-10-CM | POA: Diagnosis not present

## 2020-08-22 DIAGNOSIS — F419 Anxiety disorder, unspecified: Secondary | ICD-10-CM | POA: Diagnosis not present

## 2020-08-22 DIAGNOSIS — F32A Depression, unspecified: Secondary | ICD-10-CM | POA: Diagnosis not present

## 2020-08-23 ENCOUNTER — Encounter: Payer: Self-pay | Admitting: Family Medicine

## 2020-08-23 DIAGNOSIS — Z950 Presence of cardiac pacemaker: Secondary | ICD-10-CM | POA: Insufficient documentation

## 2020-08-25 DIAGNOSIS — F4310 Post-traumatic stress disorder, unspecified: Secondary | ICD-10-CM | POA: Diagnosis not present

## 2020-08-25 DIAGNOSIS — M48062 Spinal stenosis, lumbar region with neurogenic claudication: Secondary | ICD-10-CM | POA: Diagnosis not present

## 2020-08-25 DIAGNOSIS — F32A Depression, unspecified: Secondary | ICD-10-CM | POA: Diagnosis not present

## 2020-08-25 DIAGNOSIS — E785 Hyperlipidemia, unspecified: Secondary | ICD-10-CM | POA: Diagnosis not present

## 2020-08-25 DIAGNOSIS — F0280 Dementia in other diseases classified elsewhere without behavioral disturbance: Secondary | ICD-10-CM | POA: Diagnosis not present

## 2020-08-25 DIAGNOSIS — R42 Dizziness and giddiness: Secondary | ICD-10-CM | POA: Diagnosis not present

## 2020-08-25 DIAGNOSIS — R001 Bradycardia, unspecified: Secondary | ICD-10-CM | POA: Diagnosis not present

## 2020-08-25 DIAGNOSIS — G20 Parkinson's disease: Secondary | ICD-10-CM | POA: Diagnosis not present

## 2020-08-25 DIAGNOSIS — I10 Essential (primary) hypertension: Secondary | ICD-10-CM | POA: Diagnosis not present

## 2020-08-27 DIAGNOSIS — R42 Dizziness and giddiness: Secondary | ICD-10-CM | POA: Diagnosis not present

## 2020-08-27 DIAGNOSIS — E785 Hyperlipidemia, unspecified: Secondary | ICD-10-CM | POA: Diagnosis not present

## 2020-08-27 DIAGNOSIS — G20 Parkinson's disease: Secondary | ICD-10-CM | POA: Diagnosis not present

## 2020-08-27 DIAGNOSIS — F0280 Dementia in other diseases classified elsewhere without behavioral disturbance: Secondary | ICD-10-CM | POA: Diagnosis not present

## 2020-08-27 DIAGNOSIS — I10 Essential (primary) hypertension: Secondary | ICD-10-CM | POA: Diagnosis not present

## 2020-08-27 DIAGNOSIS — F32A Depression, unspecified: Secondary | ICD-10-CM | POA: Diagnosis not present

## 2020-08-27 DIAGNOSIS — M48062 Spinal stenosis, lumbar region with neurogenic claudication: Secondary | ICD-10-CM | POA: Diagnosis not present

## 2020-08-27 DIAGNOSIS — R001 Bradycardia, unspecified: Secondary | ICD-10-CM | POA: Diagnosis not present

## 2020-08-27 DIAGNOSIS — F4310 Post-traumatic stress disorder, unspecified: Secondary | ICD-10-CM | POA: Diagnosis not present

## 2020-08-31 ENCOUNTER — Ambulatory Visit (INDEPENDENT_AMBULATORY_CARE_PROVIDER_SITE_OTHER): Payer: Medicare PPO | Admitting: Psychology

## 2020-08-31 DIAGNOSIS — F4323 Adjustment disorder with mixed anxiety and depressed mood: Secondary | ICD-10-CM | POA: Diagnosis not present

## 2020-08-31 DIAGNOSIS — F331 Major depressive disorder, recurrent, moderate: Secondary | ICD-10-CM

## 2020-09-08 ENCOUNTER — Ambulatory Visit (INDEPENDENT_AMBULATORY_CARE_PROVIDER_SITE_OTHER): Payer: Medicare PPO | Admitting: Emergency Medicine

## 2020-09-08 ENCOUNTER — Other Ambulatory Visit: Payer: Self-pay

## 2020-09-08 DIAGNOSIS — R001 Bradycardia, unspecified: Secondary | ICD-10-CM | POA: Diagnosis not present

## 2020-09-08 LAB — CUP PACEART INCLINIC DEVICE CHECK
Battery Remaining Longevity: 147 mo
Battery Voltage: 3.2 V
Brady Statistic AP VP Percent: 0.04 %
Brady Statistic AP VS Percent: 98.11 %
Brady Statistic AS VP Percent: 0 %
Brady Statistic AS VS Percent: 1.85 %
Brady Statistic RA Percent Paced: 98.18 %
Brady Statistic RV Percent Paced: 0.05 %
Date Time Interrogation Session: 20220524164909
Implantable Lead Implant Date: 20220506
Implantable Lead Implant Date: 20220506
Implantable Lead Location: 753859
Implantable Lead Location: 753860
Implantable Lead Model: 5076
Implantable Lead Model: 5076
Implantable Pulse Generator Implant Date: 20220506
Lead Channel Impedance Value: 361 Ohm
Lead Channel Impedance Value: 437 Ohm
Lead Channel Impedance Value: 513 Ohm
Lead Channel Impedance Value: 532 Ohm
Lead Channel Pacing Threshold Amplitude: 0.5 V
Lead Channel Pacing Threshold Amplitude: 0.75 V
Lead Channel Pacing Threshold Pulse Width: 0.4 ms
Lead Channel Pacing Threshold Pulse Width: 0.4 ms
Lead Channel Sensing Intrinsic Amplitude: 12.5 mV
Lead Channel Sensing Intrinsic Amplitude: 2.375 mV
Lead Channel Setting Pacing Amplitude: 3.5 V
Lead Channel Setting Pacing Amplitude: 3.5 V
Lead Channel Setting Pacing Pulse Width: 0.4 ms
Lead Channel Setting Sensing Sensitivity: 1.2 mV

## 2020-09-08 NOTE — Progress Notes (Signed)
Wound check appointment. Steri-strips removed. Wound without redness or edema. Incision edges approximated, wound well healed. Normal device function. Thresholds, sensing, and impedances consistent with implant measurements. Device programmed at 3.5V for extra safety margin until 3 month visit. Histogram distribution appropriate for patient and level of activity. No mode switches or high ventricular rates noted. Small burst of ST V rate 100-108 during interrogation today. Patient educated about wound care, arm mobility, lifting restrictions. Patient enrolled in remote monitoring with next transmission scheduled 11/23/20. 91 day post implant follow up with Dr. Quentin Ore 11/25/20.

## 2020-09-18 ENCOUNTER — Encounter: Payer: Self-pay | Admitting: Family Medicine

## 2020-09-23 ENCOUNTER — Encounter: Payer: Self-pay | Admitting: Family Medicine

## 2020-09-25 DIAGNOSIS — G20 Parkinson's disease: Secondary | ICD-10-CM | POA: Diagnosis not present

## 2020-09-25 DIAGNOSIS — H8110 Benign paroxysmal vertigo, unspecified ear: Secondary | ICD-10-CM | POA: Diagnosis not present

## 2020-09-25 DIAGNOSIS — Z950 Presence of cardiac pacemaker: Secondary | ICD-10-CM | POA: Diagnosis not present

## 2020-09-25 DIAGNOSIS — R001 Bradycardia, unspecified: Secondary | ICD-10-CM | POA: Diagnosis not present

## 2020-09-25 DIAGNOSIS — Z9689 Presence of other specified functional implants: Secondary | ICD-10-CM | POA: Diagnosis not present

## 2020-09-28 ENCOUNTER — Ambulatory Visit (INDEPENDENT_AMBULATORY_CARE_PROVIDER_SITE_OTHER): Payer: Medicare PPO | Admitting: Psychology

## 2020-09-28 DIAGNOSIS — F331 Major depressive disorder, recurrent, moderate: Secondary | ICD-10-CM | POA: Diagnosis not present

## 2020-09-28 DIAGNOSIS — F4321 Adjustment disorder with depressed mood: Secondary | ICD-10-CM

## 2020-10-12 ENCOUNTER — Ambulatory Visit (INDEPENDENT_AMBULATORY_CARE_PROVIDER_SITE_OTHER): Payer: Medicare PPO | Admitting: Psychology

## 2020-10-12 DIAGNOSIS — F331 Major depressive disorder, recurrent, moderate: Secondary | ICD-10-CM

## 2020-10-12 DIAGNOSIS — F4323 Adjustment disorder with mixed anxiety and depressed mood: Secondary | ICD-10-CM

## 2020-10-13 ENCOUNTER — Telehealth: Payer: Self-pay | Admitting: Family Medicine

## 2020-10-13 NOTE — Chronic Care Management (AMB) (Signed)
  Chronic Care Management   Outreach Note  10/13/2020 Name: [name] MRN: [id number] DOB: [birth year]  Referred by: Tonia Ghent, MD Reason for referral : No chief complaint on file.   An unsuccessful telephone outreach was attempted today. The patient was referred to the pharmacist for assistance with care management and care coordination.   Follow Up Plan:   Tatjana Dellinger Upstream Scheduler

## 2020-10-16 ENCOUNTER — Telehealth: Payer: Self-pay | Admitting: Family Medicine

## 2020-10-16 NOTE — Progress Notes (Signed)
  Chronic Care Management   Outreach Note  10/16/2020 Name: HALSEY HAMMEN MRN: [id number] DOB: 07-04-1950  Referred by: Tonia Ghent, MD Reason for referral : No chief complaint on file.   A second unsuccessful telephone outreach was attempted today. The patient was referred to pharmacist for assistance with care management and care coordination.  Follow Up Plan:   Tatjana Dellinger Upstream Scheduler

## 2020-10-23 ENCOUNTER — Telehealth: Payer: Self-pay | Admitting: Family Medicine

## 2020-10-23 NOTE — Chronic Care Management (AMB) (Signed)
  Chronic Care Management   Outreach Note  10/23/2020 Name: TARRIN LEBOW MRN: [id number] DOB: 07-20-1950  Referred by: Tonia Ghent, MD Reason for referral : No chief complaint on file.   Third unsuccessful telephone outreach was attempted today. The patient was referred to the pharmacist for assistance with care management and care coordination.   Follow Up Plan:   Tatjana Dellinger Upstream Scheduler

## 2020-10-26 ENCOUNTER — Ambulatory Visit (INDEPENDENT_AMBULATORY_CARE_PROVIDER_SITE_OTHER): Payer: Medicare PPO | Admitting: Psychology

## 2020-10-26 DIAGNOSIS — F3132 Bipolar disorder, current episode depressed, moderate: Secondary | ICD-10-CM

## 2020-11-04 ENCOUNTER — Other Ambulatory Visit: Payer: Self-pay | Admitting: Family Medicine

## 2020-11-04 DIAGNOSIS — E786 Lipoprotein deficiency: Secondary | ICD-10-CM

## 2020-11-04 DIAGNOSIS — G20 Parkinson's disease: Secondary | ICD-10-CM

## 2020-11-06 ENCOUNTER — Telehealth: Payer: Self-pay

## 2020-11-06 ENCOUNTER — Ambulatory Visit: Payer: Medicare PPO

## 2020-11-06 ENCOUNTER — Other Ambulatory Visit (INDEPENDENT_AMBULATORY_CARE_PROVIDER_SITE_OTHER): Payer: Medicare PPO

## 2020-11-06 ENCOUNTER — Other Ambulatory Visit: Payer: Self-pay

## 2020-11-06 DIAGNOSIS — G20 Parkinson's disease: Secondary | ICD-10-CM | POA: Diagnosis not present

## 2020-11-06 DIAGNOSIS — E786 Lipoprotein deficiency: Secondary | ICD-10-CM | POA: Diagnosis not present

## 2020-11-06 LAB — CBC WITH DIFFERENTIAL/PLATELET
Basophils Absolute: 0 10*3/uL (ref 0.0–0.1)
Basophils Relative: 0.5 % (ref 0.0–3.0)
Eosinophils Absolute: 0.1 10*3/uL (ref 0.0–0.7)
Eosinophils Relative: 2.1 % (ref 0.0–5.0)
HCT: 43.7 % (ref 39.0–52.0)
Hemoglobin: 14.7 g/dL (ref 13.0–17.0)
Lymphocytes Relative: 40.4 % (ref 12.0–46.0)
Lymphs Abs: 2.5 10*3/uL (ref 0.7–4.0)
MCHC: 33.6 g/dL (ref 30.0–36.0)
MCV: 91.8 fl (ref 78.0–100.0)
Monocytes Absolute: 0.5 10*3/uL (ref 0.1–1.0)
Monocytes Relative: 7.4 % (ref 3.0–12.0)
Neutro Abs: 3.1 10*3/uL (ref 1.4–7.7)
Neutrophils Relative %: 49.6 % (ref 43.0–77.0)
Platelets: 190 10*3/uL (ref 150.0–400.0)
RBC: 4.76 Mil/uL (ref 4.22–5.81)
RDW: 13.6 % (ref 11.5–15.5)
WBC: 6.2 10*3/uL (ref 4.0–10.5)

## 2020-11-06 LAB — COMPREHENSIVE METABOLIC PANEL
ALT: 9 U/L (ref 0–53)
AST: 12 U/L (ref 0–37)
Albumin: 4.2 g/dL (ref 3.5–5.2)
Alkaline Phosphatase: 96 U/L (ref 39–117)
BUN: 22 mg/dL (ref 6–23)
CO2: 31 mEq/L (ref 19–32)
Calcium: 9.5 mg/dL (ref 8.4–10.5)
Chloride: 105 mEq/L (ref 96–112)
Creatinine, Ser: 0.97 mg/dL (ref 0.40–1.50)
GFR: 78.59 mL/min (ref >60.00)
Glucose, Bld: 112 mg/dL — ABNORMAL HIGH (ref 70–99)
Potassium: 4.2 mEq/L (ref 3.5–5.1)
Sodium: 142 mEq/L (ref 135–145)
Total Bilirubin: 0.5 mg/dL (ref 0.2–1.2)
Total Protein: 6.9 g/dL (ref 6.0–8.3)

## 2020-11-06 LAB — LIPID PANEL
Cholesterol: 182 mg/dL (ref 0–200)
HDL: 37.1 mg/dL — ABNORMAL LOW (ref >39.00)
LDL Cholesterol: 109 mg/dL — ABNORMAL HIGH (ref 0–99)
NonHDL: 145.35
Total CHOL/HDL Ratio: 5
Triglycerides: 182 mg/dL — ABNORMAL HIGH (ref 0.0–149.0)
VLDL: 36.4 mg/dL (ref 0.0–40.0)

## 2020-11-06 LAB — TSH: TSH: 2.08 u[IU]/mL (ref 0.35–5.50)

## 2020-11-06 NOTE — Progress Notes (Deleted)
Subjective:   [name] is a 70 y.o. male who presents for Medicare Annual/Subsequent preventive examination.  Review of Systems: N/A      I connected with the patient today by telephone and verified that I am speaking with the correct person using two identifiers. Location patient: home Location nurse: work Persons participating in the telephone visit: patient, nurse.   I discussed the limitations, risks, security and privacy concerns of performing an evaluation and management service by telephone and the availability of in person appointments. I also discussed with the patient that there may be a patient responsible charge related to this service. The patient expressed understanding and verbally consented to this telephonic visit.              Objective:    There were no vitals filed for this visit. There is no height or weight on file to calculate BMI.  Advanced Directives 11/21/2019 10/02/2019 08/22/2019 02/18/2019 02/16/2019 10/23/2018 10/02/2018  Does Patient Have a Medical Advance Directive? Yes No Yes Yes Yes Yes Yes  Type of Advance Directive Living will;Healthcare Power of [place];Living will [place] of facility DNR (pink MOST or yellow form) [place];Living will [place];Living will  Does patient want to make changes to medical advance directive? - - - No - Patient declined No - Patient declined - No - Patient declined  Copy of Healthcare Power of Attorney in Chart? - - No - copy requested - - No - copy requested No - copy requested  Would patient like information on creating a medical advance directive? - - - - - - -    Current Medications (verified) Outpatient Encounter Medications as of 11/06/2020  Medication Sig   aspirin EC 81 MG tablet Take 1 tablet (81 mg total) by mouth daily. Swallow whole.   carbidopa-levodopa (SINEMET IR) 25-100 MG tablet 2 tablets in AM, 1.5  tablets in afternoon, and 1.5 tablets in the evening   cholecalciferol (VITAMIN D) 1000 UNITS tablet Take 1,000 Units by mouth daily.   escitalopram (LEXAPRO) 10 MG tablet TAKE 1 TABLET DAILY   Melatonin 10 MG TABS Take 10 mg by mouth at bedtime.   memantine (NAMENDA) 10 MG tablet TAKE 1 TABLET TWICE A DAY   naproxen sodium (ALEVE) 220 MG tablet Take 220 mg by mouth.   omeprazole (PRILOSEC) 20 MG capsule TAKE 1 CAPSULE DAILY   rivastigmine (EXELON) 9.5 mg/24hr 9.5 mg daily.   No facility-administered encounter medications on file as of 11/06/2020.    Allergies (verified) Clonazepam, Morphine and related, Prednisone, and Tramadol   History: Past Medical History:  Diagnosis Date   Arthritis    Bradycardia    Cancer ([place]) 2013   skin cancer   Depression    ptsd   Dysrhythmia    chronic slow heart rate   GERD (gastroesophageal reflux disease)    Headache(784.0)    tension headaches non recent   History of chicken pox    History of kidney stones    passed   Hypertension    treated with HCTZ   Pacemaker    Parkinson's disease ([place])    dx'ed 15 years ago   PTSD (post-traumatic stress disorder)    Shortness of breath dyspnea    Sleep apnea    doesn't use C-pap   Varicose veins    Past Surgical History:  Procedure Laterality Date   CHOLECYSTECTOMY N/A 10/22/2014  Procedure: LAPAROSCOPIC CHOLECYSTECTOMY WITH INTRAOPERATIVE CHOLANGIOGRAM;  Surgeon: Dia Crawford III, MD;  Location: [facility] ORS;  Service: General;  Laterality: N/A;   cyst removed      from lip as a child   INTRAMEDULLARY (IM) NAIL INTERTROCHANTERIC N/A 02/18/2019   Procedure: INTRAMEDULLARY (IM) NAIL INTERTROCHANTRIC, RIGHT,;  Surgeon: Thornton Park, MD;  Location: [facility] ORS;  Service: Orthopedics;  Laterality: N/A;   LUMBAR LAMINECTOMY/DECOMPRESSION MICRODISCECTOMY Bilateral 12/14/2012   Procedure: Bilateral lumbar three-four, four-five decompressive laminotomy/foraminotomy;  Surgeon: Charlie Pitter, MD;  Location: [place]  NEURO ORS;  Service: Neurosurgery;  Laterality: Bilateral;   PULSE GENERATOR IMPLANT Bilateral 12/13/2013   Procedure: Bilateral implantable pulse generator placement;  Surgeon: Erline Levine, MD;  Location: [place] [facility] OR[facility];  Service: Neurosurgery;  Laterality: Bilateral;  Bilateral implantable pulse generator placement   skin cancer removed     from ears,   12 lft arm  rt leg 15   SUBTHALAMIC STIMULATOR BATTERY REPLACEMENT Bilateral 07/14/2017   Procedure: BILATERAL IMPLANTED PULSE GENERATOR CHANGE FOR DEEP BRAIN STIMULATOR;  Surgeon: Erline Levine, MD;  Location: [place];  Service: Neurosurgery;  Laterality: Bilateral;   SUBTHALAMIC STIMULATOR INSERTION Bilateral 12/06/2013   Procedure: SUBTHALAMIC STIMULATOR INSERTION;  Surgeon: Erline Levine, MD;  Location: [place] [facility] OR[facility];  Service: Neurosurgery;  Laterality: Bilateral;  Bilateral deep brain stimulator placement   Family History  Problem Relation Age of Onset   Lung cancer Father    Colon cancer Neg Hx    Prostate cancer Neg Hx    Social History   Socioeconomic History   Marital status: Married    Spouse name: Not on file   Number of children: Not on file   Years of education: Not on file   Highest education level: Not on file  Occupational History   Occupation: disabled    Comment: 2001, PD    Comment: search and rescue helicopter  Tobacco Use   Smoking status: Never   Smokeless tobacco: Former  Scientific laboratory technician Use: Never used  Substance and Sexual Activity   Alcohol use: Yes    Alcohol/week: 0.0 standard drinks    Comment: occasional (twice per month)   Drug use: No   Sexual activity: Not Currently  Other Topics Concern   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  Prev search and rescue work   3 kids from prev relationship.    Married 2014   Social Determinants of Radio broadcast assistant Strain: Not on file  Food Insecurity: Not on file  Transportation Needs:  Not on file  Physical Activity: Not on file  Stress: Not on file  Social Connections: Not on file    Tobacco Counseling Counseling given: Not Answered   Clinical Intake:                 Diabetic: No Nutrition Risk Assessment:  Has the patient had any N/V/D within the last 2 months?  {YES/NO:21197} Does the patient have any non-healing wounds?  {YES/NO:21197} Has the patient had any unintentional weight loss or weight gain?  {YES/NO:21197}  Diabetes:  Is the patient diabetic?  No  If diabetic, was a CBG obtained today?   N/A Did the patient bring in their glucometer from home?   N/A How often do you monitor your CBG's? N/A.   Financial Strains and Diabetes Management:  Are you having any financial strains with the device, your supplies or your  medication?  N/A .  Does the patient want to be seen by Chronic Care Management for management of their diabetes?   N/A Would the patient like to be referred to a Nutritionist or for Diabetic Management?   N/A           Activities of Daily Living No flowsheet data found.  Patient Care Team: Tonia Ghent, MD as PCP - General (Family Medicine) Rockey Situ Kathlene November, MD as PCP - Cardiology (Cardiology) Vickie Epley, MD as PCP - Electrophysiology (Cardiology) Estrella Myrtle, Nicolasa Ducking, LCSW as Social Worker (Licensed Clinical Social Worker) Laverle Hobby, MD as Consulting Physician (Pulmonary Disease) Minna Merritts, MD as Consulting Physician (Cardiology) Earnie Larsson, MD as Consulting Physician (Neurosurgery) Jannet Mantis, MD as Consulting Physician (Dermatology)  Indicate any recent Medical Services you may have received from other than [facility] providers in the past year (date may be approximate).     Assessment:   This is a routine wellness examination for [place].  Hearing/Vision screen No results found.  Dietary issues and exercise activities discussed:     Goals Addressed   None     Depression Screen PHQ 2/9 Scores 10/02/2018 10/03/2017 09/28/2017 07/25/2016 07/21/2015 07/21/2015  PHQ - 2 Score 0 0 0 '5 3 3  '$ PHQ- 9 Score 8 0 '14 15 10 10    '$ Fall Risk Fall Risk  10/02/2018 09/28/2017 07/03/2017 04/24/2017 12/02/2016  Falls in the past year? 1 Yes Yes Yes Yes  Comment - - - - -  Number falls in past yr: 1 2 or more 2 or more 2 or more 2 or more  Injury with Fall? 1 No - No Yes  Risk Factor Category  - High Fall Risk High Fall Risk High Fall Risk High Fall Risk  Risk for fall due to : History of fall(s);Impaired balance/gait;Impaired mobility Impaired balance/gait;History of fall(s);Impaired mobility - - -  Risk for fall due to: Comment - - - - -  Follow up - - Falls evaluation completed Falls evaluation completed Falls evaluation completed    FALL RISK PREVENTION PERTAINING TO THE HOME:  Any stairs in or around the home? Yes  If so, are there any without handrails? No  Home free of loose throw rugs in walkways, pet beds, electrical cords, etc? Yes  Adequate lighting in your home to reduce risk of falls? Yes   ASSISTIVE DEVICES UTILIZED TO PREVENT FALLS:  Life alert? {YES/NO:21197} Use of a cane, walker or w/c? {YES/NO:21197} Grab bars in the bathroom? {YES/NO:21197} Shower chair or bench in shower? {YES/NO:21197} Elevated toilet seat or a handicapped toilet? {YES/NO:21197}  TIMED UP AND GO:  Was the test performed?  N/A telephone visit .    Cognitive Function: MMSE - Mini Mental State Exam 10/02/2018 09/28/2017 07/03/2017 12/02/2016 07/25/2016  Not completed: - - Refused Unable to complete -  Orientation to time 5 5 - - 5  Orientation to Place 5 5 - - 5  Registration 3 3 - - 3  Attention/ Calculation 0 0 - - 0  Recall 3 3 - - 3  Language- name 2 objects 0 0 - - 0  Language- repeat 1 1 - - 1  Language- follow 3 step command 0 3 - - 3  Language- read & follow direction 0 0 - - 0  Write a sentence 0 0 - - 0  Copy design 0 0 - - 0  Total score 17 20 - - 20  Mini  Cog  Mini-Cog  screen was completed. Maximum score is 22. A value of 0 denotes this part of the MMSE was not completed or the patient failed this part of the Mini-Cog screening.       Immunizations Immunization History  Administered Date(s) Administered   Fluad Quad(high Dose 65+) 01/14/2019, 02/07/2020   Influenza,inj,Quad PF,6+ Mos 01/17/2014, 01/05/2015, 03/23/2016, 02/23/2017, 02/01/2018   PFIZER Comirnaty(Gray Top)Covid-19 Tri-Sucrose Vaccine 09/11/2020   PFIZER(Purple Top)SARS-COV-2 Vaccination 07/12/2019, 08/02/2019, 02/07/2020   Pneumococcal Conjugate-13 01/05/2015, 03/15/2017   Pneumococcal Polysaccharide-23 05/03/2017   Tdap 09/02/2011   Zoster Recombinat (Shingrix) 12/12/2019, 05/14/2020    TDAP status: Up to date  Flu Vaccine status: Up to date  Pneumococcal vaccine status: Up to date  Covid-19 vaccine status: Completed vaccines  Qualifies for Shingles Vaccine? Yes   Zostavax completed No   Shingrix Completed?: Yes  Screening Tests Health Maintenance  Topic Date Due   COLON CANCER SCREENING ANNUAL FOBT  09/02/2017   TETANUS/TDAP  07/26/2026 (Originally 09/01/2021)   INFLUENZA VACCINE  11/16/2020   COVID-19 Vaccine  Completed   Hepatitis C Screening  Completed   PNA vac Low Risk Adult  Completed   Zoster Vaccines- Shingrix  Completed   HPV VACCINES  Aged Out   COLONOSCOPY (Pts 45-70yr Insurance coverage will need to be confirmed)  Discontinued    Health Maintenance  Health Maintenance Due  Topic Date Due   COLON CANCER SCREENING ANNUAL FOBT  09/02/2017    {Colorectal cancer screening:2101809}  Lung Cancer Screening: (Low Dose CT Chest recommended if Age 71-80years, 30 pack-year currently smoking OR have quit w/in 15years.) does not qualify.    Additional Screening:  Hepatitis C Screening: does qualify; Completed 07/21/2015  Vision Screening: Recommended annual ophthalmology exams for early detection of glaucoma and other disorders of the eye. Is  the patient up to date with their annual eye exam?  {YES/NO:21197} Who is the provider or what is the name of the office in which the patient attends annual eye exams? *** If pt is not established with a provider, would they like to be referred to a provider to establish care? No .   Dental Screening: Recommended annual dental exams for proper oral hygiene  Community Resource Referral / Chronic Care Management: CRR required this visit?  No   CCM required this visit?  No      Plan:     I have personally reviewed and noted the following in the patient's chart:   Medical and social history Use of alcohol, tobacco or illicit drugs  Current medications and supplements including opioid prescriptions. Patient is not currently taking opioid prescriptions. Functional ability and status Nutritional status Physical activity Advanced directives List of other physicians Hospitalizations, surgeries, and ER visits in previous 12 months Vitals Screenings to include cognitive, depression, and falls Referrals and appointments  In addition, I have reviewed and discussed with patient certain preventive protocols, quality metrics, and best practice recommendations. A written personalized care plan for preventive services as well as general preventive health recommendations were provided to patient.   Due to this being a telephonic visit, the after visit summary with patients personalized plan was offered to patient via office or my-chart. Patient preferred to pick up at office at next visit or via mychart.   JAndrez Grime LPN   [id number]

## 2020-11-06 NOTE — Telephone Encounter (Signed)
Called patient 4 times trying to complete his AWV. Every time I called the call went straight to voicemail. Not sure if there is some type of call block on this number. Left message notifying patient I had called and we can reschedule for another date or provider might complete at his upcoming physical. Appointment cancelled.

## 2020-11-09 ENCOUNTER — Ambulatory Visit (INDEPENDENT_AMBULATORY_CARE_PROVIDER_SITE_OTHER): Payer: Medicare PPO | Admitting: Psychology

## 2020-11-09 DIAGNOSIS — F4323 Adjustment disorder with mixed anxiety and depressed mood: Secondary | ICD-10-CM

## 2020-11-09 DIAGNOSIS — F331 Major depressive disorder, recurrent, moderate: Secondary | ICD-10-CM | POA: Diagnosis not present

## 2020-11-17 ENCOUNTER — Other Ambulatory Visit: Payer: Medicare PPO

## 2020-11-17 ENCOUNTER — Ambulatory Visit (INDEPENDENT_AMBULATORY_CARE_PROVIDER_SITE_OTHER): Payer: Medicare PPO | Admitting: Family Medicine

## 2020-11-17 ENCOUNTER — Encounter: Payer: Self-pay | Admitting: Family Medicine

## 2020-11-17 ENCOUNTER — Other Ambulatory Visit: Payer: Self-pay

## 2020-11-17 VITALS — BP 128/78 | HR 95 | Temp 97.0°F | Ht 69.0 in | Wt 182.0 lb

## 2020-11-17 DIAGNOSIS — Z0000 Encounter for general adult medical examination without abnormal findings: Secondary | ICD-10-CM | POA: Diagnosis not present

## 2020-11-17 DIAGNOSIS — N529 Male erectile dysfunction, unspecified: Secondary | ICD-10-CM | POA: Diagnosis not present

## 2020-11-17 DIAGNOSIS — Z7189 Other specified counseling: Secondary | ICD-10-CM

## 2020-11-17 DIAGNOSIS — G20 Parkinson's disease: Secondary | ICD-10-CM | POA: Diagnosis not present

## 2020-11-17 DIAGNOSIS — R8290 Unspecified abnormal findings in urine: Secondary | ICD-10-CM

## 2020-11-17 DIAGNOSIS — R3915 Urgency of urination: Secondary | ICD-10-CM | POA: Diagnosis not present

## 2020-11-17 DIAGNOSIS — L989 Disorder of the skin and subcutaneous tissue, unspecified: Secondary | ICD-10-CM | POA: Diagnosis not present

## 2020-11-17 DIAGNOSIS — R001 Bradycardia, unspecified: Secondary | ICD-10-CM

## 2020-11-17 MED ORDER — SILDENAFIL CITRATE 20 MG PO TABS: 20.0000 mg | ORAL_TABLET | Freq: Every day | ORAL | 5 refills | Status: DC | PRN

## 2020-11-17 NOTE — Progress Notes (Signed)
This visit occurred during the SARS-CoV-2 public health emergency.  Safety protocols were in place, including screening questions prior to the visit, additional usage of staff PPE, and extensive cleaning of exam room while observing appropriate contact time as indicated for disinfecting solutions.  I have personally reviewed the Medicare Annual Wellness questionnaire and have noted 1. The patient's medical and social history 2. Their use of alcohol, tobacco or illicit drugs 3. Their current medications and supplements 4. The patient's functional ability including ADL's, fall risks, home safety risks and hearing or visual             impairment. 5. Diet and physical activities 6. Evidence for depression or mood disorders  The patients weight, height, BMI have been recorded in the chart and visual acuity is per eye clinic.  I have made referrals, counseling and provided education to the patient based review of the above and I have provided the pt with a written personalized care plan for preventive services.  Provider list updated- see scanned forms.  Routine anticipatory guidance given to patient.  See health maintenance. The possibility exists that previously documented standard health maintenance information may have been brought forward from a previous encounter into this note.  If needed, that same information has been updated to reflect the current situation based on today's encounter.    Flu yearly Shingles previously done at the [facility] PNA up-to-date Tetanus 2013 COVID up-to-date Cologuard 2012 Prostate cancer screening and PSA options (with potential risks and benefits of testing vs not testing) were discussed along with recent recs/guidelines.  He declined testing PSA at this point. Advance directive-wife designated patient were incapacitated. Cognitive function addressed- see scanned forms- and if abnormal then additional documentation follows.   In addition to [facility] Wellness,  follow up visit for the below conditions:  Parkinsons.  [facility], is getting [facility] higher walker.  Scraped R lower back with a fall recently.  He is still awaiting disability hearing from the [place].  D/w pt about restarting PT and cognitive therapy at [facility].  Needs to be set up.    Still with urinary urgency.  Ucx pending.  No burning.  No fevers.    Bradycardia resolved with pacer placement.    ED.  No NTG use.  Not lightheaded on standing.  D/w pt about trial of sildenafil with routine cautions.    PMH and SH reviewed  Meds, vitals, and allergies reviewed.   ROS: Per HPI.  Unless specifically indicated otherwise in HPI, the patient denies:  General: fever. Eyes: acute vision changes ENT: sore throat Cardiovascular: chest pain Respiratory: SOB GI: vomiting GU: dysuria Musculoskeletal: acute back pain Derm: acute rash Neuro: acute motor dysfunction Psych: worsening mood Endocrine: polydipsia Heme: bleeding Allergy: hayfever  GEN: nad, alert and oriented HEENT: ncat NECK: supple w/o LA CV: rrr. PULM: ctab, no inc wob ABD: soft, +bs EXT: no edema SKIN: no acute rash but 2 separate darker lesions on the R upper chest wall, both 1.5 cm diameter.  Lower lesion with variable pigmentation.  D/w pt about derm referral.   Using rolling walker at baseline.

## 2020-11-17 NOTE — Patient Instructions (Addendum)
Try using sildenafil and see if you can tolerate it.  We'll call about PT/therapy.   Take care.  Glad to see you.

## 2020-11-18 ENCOUNTER — Other Ambulatory Visit (HOSPITAL_BASED_OUTPATIENT_CLINIC_OR_DEPARTMENT_OTHER): Payer: Self-pay | Admitting: Family

## 2020-11-18 DIAGNOSIS — I77810 Thoracic aortic ectasia: Secondary | ICD-10-CM

## 2020-11-18 DIAGNOSIS — I351 Nonrheumatic aortic (valve) insufficiency: Secondary | ICD-10-CM

## 2020-11-18 DIAGNOSIS — Z0000 Encounter for general adult medical examination without abnormal findings: Secondary | ICD-10-CM | POA: Insufficient documentation

## 2020-11-18 LAB — URINE CULTURE
MICRO NUMBER:: 12191433
SPECIMEN QUALITY:: ADEQUATE

## 2020-11-18 NOTE — Assessment & Plan Note (Signed)
History of, resolved with pacer placement.

## 2020-11-18 NOTE — Assessment & Plan Note (Signed)
He had scraped his right lower back with a fall recently.  Fall cautions discussed with patient.  He is going to get a higher walker/upright walker and that should help.  We talked about restarting PT and cognitive therapy at [facility].  See orders.

## 2020-11-18 NOTE — Assessment & Plan Note (Signed)
No fevers.  Urine culture pending.  See notes on labs.

## 2020-11-18 NOTE — Assessment & Plan Note (Signed)
Refer to dermatology.  He agrees.

## 2020-11-18 NOTE — Assessment & Plan Note (Signed)
Flu yearly Shingles previously done at the [facility] P[facility] up-to-date Tetanus 2013 COVID up-to-date Cologuard 2012 Prostate cancer screening and PSA options (with potential risks and benefits of testing vs not testing) were discussed along with recent recs/guidelines.  He declined testing PSA at this point. Advance directive-wife designated patient were incapacitated. Cognitive function addressed- see scanned forms- and if abnormal then additional documentation follows.

## 2020-11-18 NOTE — Assessment & Plan Note (Signed)
Advance directive-wife designated patient were incapacitated. 

## 2020-11-18 NOTE — Assessment & Plan Note (Signed)
Discussed with patient about trial of sildenafil routine cautions.  He can update me as needed.

## 2020-11-19 ENCOUNTER — Other Ambulatory Visit: Payer: Self-pay

## 2020-11-19 ENCOUNTER — Ambulatory Visit (INDEPENDENT_AMBULATORY_CARE_PROVIDER_SITE_OTHER): Payer: Medicare PPO

## 2020-11-19 DIAGNOSIS — I77810 Thoracic aortic ectasia: Secondary | ICD-10-CM

## 2020-11-19 DIAGNOSIS — I351 Nonrheumatic aortic (valve) insufficiency: Secondary | ICD-10-CM | POA: Diagnosis not present

## 2020-11-19 LAB — ECHOCARDIOGRAM COMPLETE
AR max vel: 3.36 cm2
AV Area VTI: 3.27 cm2
AV Area mean vel: 3.24 cm2
AV Mean grad: 2 mmHg
AV Peak grad: 4.2 mmHg
Ao pk vel: 1.02 m/s
Area-P 1/2: 5.38 cm2
Calc EF: 53.2 %
P 1/2 time: 871 msec
S' Lateral: 3.4 cm
Single Plane A2C EF: 53.8 %
Single Plane A4C EF: 51.5 %

## 2020-11-20 ENCOUNTER — Other Ambulatory Visit: Payer: Self-pay | Admitting: Family Medicine

## 2020-11-20 DIAGNOSIS — G20 Parkinson's disease: Secondary | ICD-10-CM

## 2020-11-23 ENCOUNTER — Telehealth: Payer: Self-pay | Admitting: *Deleted

## 2020-11-23 ENCOUNTER — Ambulatory Visit (INDEPENDENT_AMBULATORY_CARE_PROVIDER_SITE_OTHER): Payer: Medicare PPO | Admitting: Psychology

## 2020-11-23 ENCOUNTER — Ambulatory Visit (INDEPENDENT_AMBULATORY_CARE_PROVIDER_SITE_OTHER): Payer: Medicare PPO

## 2020-11-23 DIAGNOSIS — F331 Major depressive disorder, recurrent, moderate: Secondary | ICD-10-CM | POA: Diagnosis not present

## 2020-11-23 DIAGNOSIS — F4323 Adjustment disorder with mixed anxiety and depressed mood: Secondary | ICD-10-CM | POA: Diagnosis not present

## 2020-11-23 DIAGNOSIS — I495 Sick sinus syndrome: Secondary | ICD-10-CM | POA: Diagnosis not present

## 2020-11-23 NOTE — Telephone Encounter (Signed)
-----   Message from Loel Dubonnet, NP sent at 11/20/2020  7:53 AM EDT ----- Echocardiogram with normal heart pumping and relaxing function.  Mild leakiness of the aortic valve.  Aortic root appropriate diameter.  Great result!

## 2020-11-23 NOTE — Telephone Encounter (Signed)
Left voicemail message to call back for review of results.  

## 2020-11-24 LAB — CUP PACEART REMOTE DEVICE CHECK
Battery Remaining Longevity: 164 mo
Battery Voltage: 3.17 V
Brady Statistic AP VP Percent: 0.04 %
Brady Statistic AP VS Percent: 91.63 %
Brady Statistic AS VP Percent: 0 %
Brady Statistic AS VS Percent: 8.33 %
Brady Statistic RA Percent Paced: 91.65 %
Brady Statistic RV Percent Paced: 0.04 %
Date Time Interrogation Session: 20220808013143
Implantable Lead Implant Date: 20220506
Implantable Lead Implant Date: 20220506
Implantable Lead Location: 753859
Implantable Lead Location: 753860
Implantable Lead Model: 5076
Implantable Lead Model: 5076
Implantable Pulse Generator Implant Date: 20220506
Lead Channel Impedance Value: 361 Ohm
Lead Channel Impedance Value: 399 Ohm
Lead Channel Impedance Value: 513 Ohm
Lead Channel Impedance Value: 627 Ohm
Lead Channel Pacing Threshold Amplitude: 0.75 V
Lead Channel Pacing Threshold Amplitude: 0.75 V
Lead Channel Pacing Threshold Pulse Width: 0.4 ms
Lead Channel Pacing Threshold Pulse Width: 0.4 ms
Lead Channel Sensing Intrinsic Amplitude: 10.75 mV
Lead Channel Sensing Intrinsic Amplitude: 10.75 mV
Lead Channel Sensing Intrinsic Amplitude: 2.125 mV
Lead Channel Sensing Intrinsic Amplitude: 2.125 mV
Lead Channel Setting Pacing Amplitude: 1.5 V
Lead Channel Setting Pacing Amplitude: 2 V
Lead Channel Setting Pacing Pulse Width: 0.4 ms
Lead Channel Setting Sensing Sensitivity: 1.2 mV

## 2020-11-24 NOTE — Progress Notes (Signed)
Electrophysiology Office Follow up Visit Note:    Date:  11/25/2020   ID:  [name], DOB August 14, 1950, MRN [id number]  PCP:  Tonia Ghent, MD  [facility] Cardiologist:  Ida Rogue, MD  [facility] Electrophysiologist:  Vickie Epley, MD    Interval History:    [name] is a 70 y.o. male who presents for a follow up visit. They were last seen in clinic 06/17/2020 for tachy-brady syndrome. The patient underwent pacemaker implant at [facility] with Dr Roderic Palau and has done well since implant. The device is now followed here in our clinic.  He is with his wife today in clinic who I have previously met.  Wife tells me that he has had improved energy level since pacemaker implant.  They are planning a trip for 3 days.  Yesterday he walked around [place] using a shopping cart without significant limitation which is a big improvement for him.   Past Medical History:  Diagnosis Date   Arthritis    Bradycardia    Cancer ([place]) 2013   skin cancer   Depression    ptsd   Dysrhythmia    chronic slow heart rate   GERD (gastroesophageal reflux disease)    Headache(784.0)    tension headaches non recent   History of chicken pox    History of kidney stones    passed   Hypertension    treated with HCTZ   Pacemaker    Parkinson's disease ([place])    dx'ed 15 years ago   PTSD (post-traumatic stress disorder)    Shortness of breath dyspnea    Sleep apnea    doesn't use C-pap   Varicose veins     Past Surgical History:  Procedure Laterality Date   CHOLECYSTECTOMY N/A 10/22/2014   Procedure: LAPAROSCOPIC CHOLECYSTECTOMY WITH INTRAOPERATIVE CHOLANGIOGRAM;  Surgeon: Dia Crawford III, MD;  Location: [facility] ORS;  Service: General;  Laterality: N/A;   cyst removed      from lip as a child   INTRAMEDULLARY (IM) NAIL INTERTROCHANTERIC N/A 02/18/2019   Procedure: INTRAMEDULLARY (IM) NAIL INTERTROCHANTRIC, RIGHT,;  Surgeon: Thornton Park, MD;  Location: [facility] ORS;  Service:  Orthopedics;  Laterality: N/A;   LUMBAR LAMINECTOMY/DECOMPRESSION MICRODISCECTOMY Bilateral 12/14/2012   Procedure: Bilateral lumbar three-four, four-five decompressive laminotomy/foraminotomy;  Surgeon: Charlie Pitter, MD;  Location: [place] [facility];  Service: Neurosurgery;  Laterality: Bilateral;   PULSE GENERATOR IMPLANT Bilateral 12/13/2013   Procedure: Bilateral implantable pulse generator placement;  Surgeon: Erline Levine, MD;  Location: [place] [facility];  Service: Neurosurgery;  Laterality: Bilateral;  Bilateral implantable pulse generator placement   skin cancer removed     from ears,   12 lft arm  rt leg 15   SUBTHALAMIC STIMULATOR BATTERY REPLACEMENT Bilateral 07/14/2017   Procedure: BILATERAL IMPLANTED PULSE GENERATOR CHANGE FOR DEEP BRAIN STIMULATOR;  Surgeon: Erline Levine, MD;  Location: [place];  Service: Neurosurgery;  Laterality: Bilateral;   SUBTHALAMIC STIMULATOR INSERTION Bilateral 12/06/2013   Procedure: SUBTHALAMIC STIMULATOR INSERTION;  Surgeon: Erline Levine, MD;  Location: [place] [facility];  Service: Neurosurgery;  Laterality: Bilateral;  Bilateral deep brain stimulator placement    Current Medications: Current Meds  Medication Sig   aspirin EC 81 MG tablet Take 1 tablet (81 mg total) by mouth daily. Swallow whole.   carbidopa-levodopa (SINEMET IR) 25-100 MG tablet 2 tablets in AM, 1.5 tablets in afternoon, and 1.5 tablets in the evening   cholecalciferol (VITAMIN D) 1000 UNITS tablet Take 1,000 Units by  mouth daily.   Cranberry 200 MG CAPS Take by mouth.   escitalopram (LEXAPRO) 10 MG tablet TAKE 1 TABLET DAILY   Melatonin 10 MG TABS Take 10 mg by mouth at bedtime.   memantine (NAMENDA) 10 MG tablet TAKE 1 TABLET TWICE A DAY   naproxen sodium (ALEVE) 220 MG tablet Take 220 mg by mouth.   omeprazole (PRILOSEC) 20 MG capsule TAKE 1 CAPSULE DAILY   rivastigmine (EXELON) 9.5 mg/24hr 9.5 mg daily.   sildenafil (REVATIO) 20 MG tablet Take 1-5 tablets (20-100 mg total) by mouth daily as  needed.     Allergies:   Clonazepam, Morphine and related, Prednisone, and Tramadol   Social History   Socioeconomic History   Marital status: Married    Spouse name: Not on file   Number of children: Not on file   Years of education: Not on file   Highest education level: Not on file  Occupational History   Occupation: disabled    Comment: 2001, [place]    Comment: search and rescue helicopter  Tobacco Use   Smoking status: Never   Smokeless tobacco: Former  Scientific laboratory technician Use: Never used  Substance and Sexual Activity   Alcohol use: Yes    Alcohol/week: 0.0 standard drinks    Comment: occasional (twice per month)   Drug use: No   Sexual activity: Not Currently  Other Topics Concern   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  Prev search and rescue work   3 kids from prev relationship.    Married 2014   Social Determinants of Radio broadcast assistant Strain: Not on file  Food Insecurity: Not on file  Transportation Needs: Not on file  Physical Activity: Not on file  Stress: Not on file  Social Connections: Not on file     Family History: The patient's family history includes Lung cancer in his father. There is no history of Colon cancer or Prostate cancer.  ROS:   Please see the history of present illness.    All other systems reviewed and are negative.  EKGs/Labs/Other Studies Reviewed:    The following studies were reviewed today:   November 25, 2020 in clinic device interrogation personally reviewed Battery longevity 13.7 years Underlying rhythm sinus bradycardia 38 bpm Lead parameters are all stable Ventricular pacing less than 0.1% Atrially pacing 91.7% AAIR to DDDR 60-1 30  Recent Labs: 11/06/2020: ALT 9; BUN 22; Creatinine, Ser 0.97; Hemoglobin 14.7; Platelets 190.0; Potassium 4.2; Sodium 142; TSH 2.08  Recent Lipid Panel    Component Value Date/Time   CHOL 182 11/06/2020 0806   CHOL  185 06/03/2013 1051   TRIG 182.0 (H) 11/06/2020 0806   HDL 37.10 (L) 11/06/2020 0806   HDL 35 (L) 06/03/2013 1051   CHOLHDL 5 11/06/2020 0806   VLDL 36.4 11/06/2020 0806   LDLCALC 109 (H) 11/06/2020 0806   LDLCALC 127 (H) 06/03/2013 1051    Physical Exam:    VS:  BP 138/82 (BP Location: Left Arm, Patient Position: Sitting, Cuff Size: Normal)   Pulse 80   Ht _0  (1.753 m)   Wt 181 lb (82.1 kg)   SpO2 96%   BMI 26.73 kg/m     Wt Readings from Last 3 Encounters:  11/25/20 181 lb (82.1 kg)  11/17/20 182 lb (82.6 kg)  06/17/20 181 lb (82.1 kg)     GEN:  Well nourished, well  developed in no acute distress HEENT: Normal NECK: No JVD; No carotid bruits LYMPHATICS: No lymphadenopathy CARDIAC: RRR, no murmurs, rubs, gallops.  Pacemaker pocket on left chest wall well-healed. RESPIRATORY:  Clear to auscultation without rales, wheezing or rhonchi  ABDOMEN: Soft, non-tender, non-distended MUSCULOSKELETAL:  No edema; No deformity  SKIN: Warm and dry NEUROLOGIC:  Alert and oriented x 3 PSYCHIATRIC:  Normal affect   ASSESSMENT:    1. Tachycardia-bradycardia syndrome ([place])   2. Parkinson's disease ([place])   3. Pacemaker    PLAN:    In order of problems listed above:   1. Tachycardia-bradycardia syndrome [facility]) Doing well after pacemaker implanted.  Device is functioning well.  Improved energy level.  Continue remote monitoring.  I will plan to see him back in 1 year in person or sooner as needed.  2. Parkinson's disease ([place]) Stable.  Deep brain stimulator in place.  3. Pacemaker See #1.    Follow-up 1 year    Send clinic note to Roderic Palau, MD at [facility].     Medication Adjustments/Labs and Tests Ordered: Current medicines are reviewed at length with the patient today.  Concerns regarding medicines are outlined above.  No orders of the defined types were placed in this encounter.  No orders of the defined types were placed in this  encounter.    Signed, Lars Mage, MD, [facility], [facility] 11/25/2020 10:35 AM    Electrophysiology [facility]

## 2020-11-24 NOTE — Telephone Encounter (Signed)
Reviewed results with patients wife per release form and she verbalized understanding with no further questions at this time. Confirmed appointment for tomorrow with Dr. Quentin Ore. No other concerns.

## 2020-11-25 ENCOUNTER — Other Ambulatory Visit: Payer: Self-pay

## 2020-11-25 ENCOUNTER — Ambulatory Visit (INDEPENDENT_AMBULATORY_CARE_PROVIDER_SITE_OTHER): Payer: Medicare PPO | Admitting: Cardiology

## 2020-11-25 ENCOUNTER — Encounter: Payer: Self-pay | Admitting: Cardiology

## 2020-11-25 ENCOUNTER — Telehealth: Payer: Self-pay

## 2020-11-25 VITALS — BP 138/82 | HR 80 | Ht 69.0 in | Wt 181.0 lb

## 2020-11-25 DIAGNOSIS — Z950 Presence of cardiac pacemaker: Secondary | ICD-10-CM | POA: Diagnosis not present

## 2020-11-25 DIAGNOSIS — I495 Sick sinus syndrome: Secondary | ICD-10-CM

## 2020-11-25 DIAGNOSIS — G20 Parkinson's disease: Secondary | ICD-10-CM | POA: Diagnosis not present

## 2020-11-25 DIAGNOSIS — N529 Male erectile dysfunction, unspecified: Secondary | ICD-10-CM

## 2020-11-25 DIAGNOSIS — R300 Dysuria: Secondary | ICD-10-CM

## 2020-11-25 MED ORDER — SULFAMETHOXAZOLE-TRIMETHOPRIM 800-160 MG PO TABS: 1.0000 | ORAL_TABLET | Freq: Two times a day (BID) | ORAL | 0 refills | Status: DC

## 2020-11-25 NOTE — Patient Instructions (Signed)
Medication Instructions:  Your physician recommends that you continue on your current medications as directed. Please refer to the Current Medication list given to you today.  *If you need a refill on your cardiac medications before your next appointment, please call your pharmacy*   Lab Work: None ordered.  If you have labs (blood work) drawn today and your tests are completely normal, you will receive your results only by: [place] (if you have MyChart) OR A paper copy in the mail If you have any lab test that is abnormal or we need to change your treatment, we will call you to review the results.   Testing/Procedures: None ordered.    Follow-Up: At [facility], you and your health needs are our priority.  As part of our continuing mission to provide you with exceptional heart care, we have created designated Provider Care Teams.  These Care Teams include your primary Cardiologist (physician) and Advanced Practice Providers (APPs -  Physician Assistants and Nurse Practitioners) who all work together to provide you with the care you need, when you need it.  We recommend signing up for the patient portal called "MyChart".  Sign up information is provided on this After Visit Summary.  MyChart is used to connect with patients for Virtual Visits (Telemedicine).  Patients are able to view lab/test results, encounter notes, upcoming appointments, etc.  Non-urgent messages can be sent to your provider as well.   To learn more about what you can do with MyChart, go to [email or web address].    Your next appointment:   12 month(s)  The format for your next appointment:   In Person  Provider:   Lars Mage, MD

## 2020-11-25 NOTE — Telephone Encounter (Signed)
I would stop sildenafil and treat him preemptively for UTI.  I put in a referral to urology but that is not the immediate issue to address.  I would start septra, rx sent.  See if they can drop off a urine sample for culture.  I put in the order.  Thanks.

## 2020-11-25 NOTE — Telephone Encounter (Signed)
[name] advised. They will try and bring urine sample by the office, advised [name] preferably to collect urine sample before patient starts on antibiotic for a better accuracy of results. [name] aware that [facility] urology office will call them directly to schedule

## 2020-11-25 NOTE — Telephone Encounter (Signed)
Pts wife (DPR signed) said that pt was started on sildenafil 20 mg and pt has tried sildenafil 20 mg taking 5 tabs with no help with ED. Pt has burning upon urination,incontinence of urine which is new symptom. No blood in urine, no frequency or urgency of urine, no fever, no abd pain. Pt last had office visit on 11/17/20. [name] wants to know if pt needs referral to urology or Does Dr Damita Dunnings want to do different med for ED and ck urine specimen at [facility]. UC & ED precautions given and pts wife voiced understanding. [name] will wait for cb after reviewed by Dr Damita Dunnings.

## 2020-12-02 ENCOUNTER — Other Ambulatory Visit: Payer: Self-pay

## 2020-12-02 ENCOUNTER — Ambulatory Visit
Admission: RE | Admit: 2020-12-02 | Discharge: 2020-12-02 | Disposition: A | Discharge status: Home / Self Care | Payer: Medicare PPO | Source: Ambulatory Visit | Attending: Emergency Medicine | Admitting: Emergency Medicine

## 2020-12-02 ENCOUNTER — Telehealth: Payer: Self-pay | Admitting: *Deleted

## 2020-12-02 VITALS — BP 123/83 | HR 86 | Temp 97.5°F | Resp 16

## 2020-12-02 DIAGNOSIS — J029 Acute pharyngitis, unspecified: Secondary | ICD-10-CM

## 2020-12-02 DIAGNOSIS — Z1152 Encounter for screening for COVID-19: Secondary | ICD-10-CM

## 2020-12-02 LAB — POCT RAPID STREP A (OFFICE): Rapid Strep A Screen: NEGATIVE

## 2020-12-02 NOTE — ED Provider Notes (Signed)
[facility]    CSN: [id number] Arrival date & time: 12/02/20  1350      History   Chief Complaint Chief Complaint  Patient presents with   Sore Throat    X 5 days    Appointment    [id number]    HPI [name] is a 70 y.o. male.  Accompanied by his wife, patient presents with 5-day history of sore throat.  Wife also reports 1 episode of diarrhea today.  No fever, chills, rash, unusual cough, difficulty breathing, or other symptoms.  Negative at-home COVID test 3 days ago.  No treatments at home.  His medical history includes Parkinson's, dementia, hypertension, pacemaker.   The history is provided by the patient, the spouse and medical records.   Past Medical History:  Diagnosis Date   Arthritis    Bradycardia    Cancer ([place]) 2013   skin cancer   Depression    ptsd   Dysrhythmia    chronic slow heart rate   GERD (gastroesophageal reflux disease)    Headache(784.0)    tension headaches non recent   History of chicken pox    History of kidney stones    passed   Hypertension    treated with HCTZ   Pacemaker    Parkinson's disease ([place])    dx'ed 15 years ago   PTSD (post-traumatic stress disorder)    Shortness of breath dyspnea    Sleep apnea    doesn't use C-pap   Varicose veins     Patient Active Problem List   Diagnosis Date Noted   Medicare annual wellness visit, subsequent 11/18/2020   Pacemaker    Dementia associated with Parkinson's disease ([place]) 06/17/2020   Vertigo 02/19/2020   Urine abnormality 02/19/2020   Dysphagia 02/23/2019   Fall at home 10/18/2017   REM behavioral disorder 11/02/2016   Radicular pain in right arm 10/09/2016   GERD (gastroesophageal reflux disease) 07/31/2016   Colon cancer screening 07/23/2015   Gout 02/18/2015   Depression 01/06/2015   Skin lesion 10/02/2014   Dupuytren's contracture 08/20/2014   Cough 07/21/2014   Lumbar stenosis with neurogenic claudication 06/13/2014   S/P deep brain stimulator placement  05/08/2014   Advance care planning 01/19/2014   Parkinson's disease ([place]) 12/13/2013   PTSD (post-traumatic stress disorder) 06/13/2013   Erectile dysfunction 06/13/2013   HLD (hyperlipidemia) 06/13/2013   Essential hypertension 06/03/2013   Bradycardia by electrocardiogram 06/03/2013   Obstructive sleep apnea 03/12/2013    Past Surgical History:  Procedure Laterality Date   CHOLECYSTECTOMY N/A 10/22/2014   Procedure: LAPAROSCOPIC CHOLECYSTECTOMY WITH INTRAOPERATIVE CHOLANGIOGRAM;  Surgeon: Dia Crawford III, MD;  Location: [facility] ORS;  Service: General;  Laterality: N/A;   cyst removed      from lip as a child   INTRAMEDULLARY (IM) NAIL INTERTROCHANTERIC N/A 02/18/2019   Procedure: INTRAMEDULLARY (IM) NAIL INTERTROCHANTRIC, RIGHT,;  Surgeon: Thornton Park, MD;  Location: [facility] ORS;  Service: Orthopedics;  Laterality: N/A;   LUMBAR LAMINECTOMY/DECOMPRESSION MICRODISCECTOMY Bilateral 12/14/2012   Procedure: Bilateral lumbar three-four, four-five decompressive laminotomy/foraminotomy;  Surgeon: Charlie Pitter, MD;  Location: [place] [facility];  Service: Neurosurgery;  Laterality: Bilateral;   PULSE GENERATOR IMPLANT Bilateral 12/13/2013   Procedure: Bilateral implantable pulse generator placement;  Surgeon: Erline Levine, MD;  Location: [place] [facility];  Service: Neurosurgery;  Laterality: Bilateral;  Bilateral implantable pulse generator placement   skin cancer removed     from ears,   12 lft arm  rt leg 15  SUBTHALAMIC STIMULATOR BATTERY REPLACEMENT Bilateral 07/14/2017   Procedure: BILATERAL IMPLANTED PULSE GENERATOR CHANGE FOR DEEP BRAIN STIMULATOR;  Surgeon: Erline Levine, MD;  Location: [place];  Service: Neurosurgery;  Laterality: Bilateral;   SUBTHALAMIC STIMULATOR INSERTION Bilateral 12/06/2013   Procedure: SUBTHALAMIC STIMULATOR INSERTION;  Surgeon: Erline Levine, MD;  Location: [place] [facility];  Service: Neurosurgery;  Laterality: Bilateral;  Bilateral deep brain stimulator placement       Home  Medications    Prior to Admission medications   Medication Sig Start Date End Date Taking? Authorizing Provider  aspirin EC 81 MG tablet Take 1 tablet (81 mg total) by mouth daily. Swallow whole. 11/18/19   Loel Dubonnet, NP  carbidopa-levodopa (SINEMET IR) 25-100 MG tablet 2 tablets in AM, 1.5 tablets in afternoon, and 1.5 tablets in the evening    [provider]  cholecalciferol (VITAMIN D) 1000 UNITS tablet Take 1,000 Units by mouth daily.    [provider]  Cranberry 200 MG CAPS Take by mouth.    [provider]  escitalopram (LEXAPRO) 10 MG tablet TAKE 1 TABLET DAILY 08/13/20   Tonia Ghent, MD  Melatonin 10 MG TABS Take 10 mg by mouth at bedtime.    [provider]  memantine (NAMENDA) 10 MG tablet TAKE 1 TABLET TWICE A DAY 02/06/20   Tonia Ghent, MD  naproxen sodium (ALEVE) 220 MG tablet Take 220 mg by mouth.    [provider]  omeprazole (PRILOSEC) 20 MG capsule TAKE 1 CAPSULE DAILY 08/13/20   Tonia Ghent, MD  rivastigmine (EXELON) 9.5 mg/24hr 9.5 mg daily. 09/22/19   [provider]  sulfamethoxazole-trimethoprim (BACTRIM DS) 800-160 MG tablet Take 1 tablet by mouth 2 (two) times daily. 11/25/20   Tonia Ghent, MD    Family History Family History  Problem Relation Age of Onset   Lung cancer Father    Colon cancer Neg Hx    Prostate cancer Neg Hx     Social History Social History   Tobacco Use   Smoking status: Never   Smokeless tobacco: Former  Scientific laboratory technician Use: Never used  Substance Use Topics   Alcohol use: Yes    Alcohol/week: 0.0 standard drinks    Comment: occasional (twice per month)   Drug use: No     Allergies   Clonazepam, Morphine and related, Prednisone, and Tramadol   Review of Systems Review of Systems  Constitutional:  Negative for chills and fever.  HENT:  Positive for sore throat. Negative for ear pain.   Respiratory:  Negative for cough and shortness of breath.    Cardiovascular:  Negative for chest pain and palpitations.  Gastrointestinal:  Positive for diarrhea. Negative for abdominal pain and vomiting.  Skin:  Negative for color change and rash.  All other systems reviewed and are negative.   Physical Exam Triage Vital Signs ED Triage Vitals  Enc Vitals Group     BP      Pulse      Resp      Temp      Temp src      SpO2      Weight      Height      Head Circumference      Peak Flow      Pain Score      Pain Loc      Pain Edu?      Excl. in [place]?    No data found.  Updated Vital Signs BP 123/83 (BP Location: Left Arm)   Pulse 86   Temp (!) 97.5 F (36.4 C) (Temporal)   Resp 16   SpO2 95%   Visual Acuity Right Eye Distance:   Left Eye Distance:   Bilateral Distance:    Right Eye Near:   Left Eye Near:    Bilateral Near:     Physical Exam Vitals and nursing note reviewed.  Constitutional:      General: He is not in acute distress.    Appearance: He is well-developed.  HENT:     Head: Normocephalic and atraumatic.     Right Ear: Tympanic membrane normal.     Left Ear: Tympanic membrane normal.     Mouth/Throat:     Mouth: Mucous membranes are moist.     Pharynx: Posterior oropharyngeal erythema present. No oropharyngeal exudate.  Eyes:     Conjunctiva/sclera: Conjunctivae normal.  Cardiovascular:     Rate and Rhythm: Normal rate and regular rhythm.     Heart sounds: Normal heart sounds.  Pulmonary:     Effort: Pulmonary effort is normal. No respiratory distress.     Breath sounds: Normal breath sounds.  Abdominal:     Palpations: Abdomen is soft.     Tenderness: There is no abdominal tenderness.  Musculoskeletal:     Cervical back: Neck supple.  Skin:    General: Skin is warm and dry.  Neurological:     Mental Status: He is alert. Mental status is at baseline.  Psychiatric:        Mood and Affect: Mood normal.        Behavior: Behavior normal.     UC Treatments / Results  Labs (all labs ordered  are listed, but only abnormal results are displayed) Labs Reviewed  NOVEL CORONAVIRUS, NAA  POCT RAPID STREP A (OFFICE)    EKG   Radiology No results found.  Procedures Procedures (including critical care time)  Medications Ordered in UC Medications - No data to display  Initial Impression / Assessment and Plan / UC Course  I have reviewed the triage vital signs and the nursing notes.  Pertinent labs & imaging results that were available during my care of the patient were reviewed by me and considered in my medical decision making (see chart for details).    Sore throat.  Rapid strep negative.  COVID pending.  Instructed patient to self quarantine per CDC guidelines.  Discussed symptomatic treatment including Tylenol, rest, hydration.  Instructed patient to follow up with PCP if symptoms are not improving.  Patient and his wife agree to plan of care.   Final Clinical Impressions(s) / UC Diagnoses   Final diagnoses:  Encounter for screening for COVID-19  Sore throat     Discharge Instructions      Your rapid strep test is negative.    Your COVID test is pending.  You should self quarantine until the test result is back.    Take Tylenol as needed for fever or discomfort.  Rest and keep yourself hydrated.    Follow-up with your primary care provider if your symptoms are not improving.            ED Prescriptions   None    PDMP not reviewed this encounter.   Sharion Balloon, NP 12/02/20 1418

## 2020-12-02 NOTE — Telephone Encounter (Signed)
Noted. Thanks.

## 2020-12-02 NOTE — Telephone Encounter (Signed)
Patient's wife called stating that her husband has been complaining of a sore throat off and on for about 5 days. Patient's wife stated that her husband is complaining now of it being real bad. Patient's wife stated that he is sneezing and has a cough, but that is not unusual for him. Patient's wife denies a fever. Mrs. Myler stated that she did a covid test and it was negative. Patient's wife was advised that we do not have any appointments available today at the office and Dr. Damita Dunnings is out of the office. Patient's wife stated that she will plan on taking him to [facility] today to be checked out. Patient's wife was given information on [facility]. Patient's wife stated that she will plan on taking him to [facility] today.

## 2020-12-02 NOTE — ED Triage Notes (Signed)
Patient presents to Urgent Care with complaints of  sore throat x 5 days and one episode of diarrhea today. Not treating symptoms. Had a negative covid test Sunday.    Denies fever.

## 2020-12-02 NOTE — Discharge Instructions (Addendum)
Your rapid strep test is negative.    Your COVID test is pending.  You should self quarantine until the test result is back.    Take Tylenol as needed for fever or discomfort.  Rest and keep yourself hydrated.    Follow-up with your primary care provider if your symptoms are not improving.

## 2020-12-03 LAB — SARS-COV-2, NAA 2 DAY TAT

## 2020-12-03 LAB — NOVEL CORONAVIRUS, NAA: SARS-CoV-2, NAA: NOT DETECTED

## 2020-12-07 ENCOUNTER — Ambulatory Visit (INDEPENDENT_AMBULATORY_CARE_PROVIDER_SITE_OTHER): Payer: Medicare PPO | Admitting: Psychology

## 2020-12-07 DIAGNOSIS — F4323 Adjustment disorder with mixed anxiety and depressed mood: Secondary | ICD-10-CM

## 2020-12-07 DIAGNOSIS — F331 Major depressive disorder, recurrent, moderate: Secondary | ICD-10-CM | POA: Diagnosis not present

## 2020-12-08 ENCOUNTER — Ambulatory Visit: Payer: Medicare PPO | Attending: Family Medicine

## 2020-12-08 ENCOUNTER — Other Ambulatory Visit: Payer: Self-pay

## 2020-12-08 ENCOUNTER — Ambulatory Visit: Payer: Medicare PPO | Admitting: Speech Pathology

## 2020-12-08 ENCOUNTER — Telehealth: Payer: Self-pay

## 2020-12-08 DIAGNOSIS — R2689 Other abnormalities of gait and mobility: Secondary | ICD-10-CM | POA: Diagnosis not present

## 2020-12-08 DIAGNOSIS — G20 Parkinson's disease: Secondary | ICD-10-CM | POA: Insufficient documentation

## 2020-12-08 DIAGNOSIS — R471 Dysarthria and anarthria: Secondary | ICD-10-CM

## 2020-12-08 DIAGNOSIS — R296 Repeated falls: Secondary | ICD-10-CM | POA: Diagnosis not present

## 2020-12-08 DIAGNOSIS — F0280 Dementia in other diseases classified elsewhere without behavioral disturbance: Secondary | ICD-10-CM

## 2020-12-08 DIAGNOSIS — R2681 Unsteadiness on feet: Secondary | ICD-10-CM | POA: Insufficient documentation

## 2020-12-08 DIAGNOSIS — M6281 Muscle weakness (generalized): Secondary | ICD-10-CM | POA: Diagnosis not present

## 2020-12-08 DIAGNOSIS — R41841 Cognitive communication deficit: Secondary | ICD-10-CM | POA: Insufficient documentation

## 2020-12-08 DIAGNOSIS — R278 Other lack of coordination: Secondary | ICD-10-CM | POA: Diagnosis not present

## 2020-12-08 NOTE — Telephone Encounter (Signed)
Candy (DPR signed) pt seen [place] on 12/02/20; PCR covid test was neg on 12/04/20. Pt has scratchy S/T. Pt cleaned ears this morning there was dried blood in both ears.no other discharge from ears noted. No earache,not sure of any difficulty in hearing due to parkinson and sometimes pt does not pay attention.no fever, pt has H/A off and on but usually on and off has h/a. No cough or SOB.No diarrhea or vomiting; no runny nose or head congestion. Pt could not come today for appt offered due to pt being at speech therapy. Scheduled appt with Dr Einar Pheasant on 12/09/20 at 10:20. UC & ED precautions given and pts wife voiced understanding. Sending note to Dr Sherrilyn Rist CMA and Dr Damita Dunnings as PCP for FYI.

## 2020-12-08 NOTE — Therapy (Signed)
[place] MAIN [facility] SERVICES [address] [place], [place], [address] Phone: [phone]   Fax:  [phone]  Physical Therapy Evaluation  Patient Details  Name: [name] MRN: [id number] Date of Birth: 02-28-1951 Referring Provider (PT): Elsie Stain, MD   Encounter Date: 12/08/2020   PT End of Session - 12/08/20 1611     Visit Number 1    Number of Visits 24    Date for PT Re-Evaluation 01/05/21    Authorization Type [employer] Medicare Choice PPO    Authorization Time Period 12/08/20-03/02/21    Progress Note Due on Visit 10    PT Start Time 1120    PT Stop Time 1220    PT Time Calculation (min) 60 min    Equipment Utilized During Treatment Gait belt    Activity Tolerance Patient tolerated treatment well;No increased pain;Patient limited by fatigue    Behavior During Therapy [facility] for tasks assessed/performed             Past Medical History:  Diagnosis Date   Arthritis    Bradycardia    Cancer ([place]) 2013   skin cancer   Depression    ptsd   Dysrhythmia    chronic slow heart rate   GERD (gastroesophageal reflux disease)    Headache(784.0)    tension headaches non recent   History of chicken pox    History of kidney stones    passed   Hypertension    treated with HCTZ   Pacemaker    Parkinson's disease ([place])    dx'ed 15 years ago   PTSD (post-traumatic stress disorder)    Shortness of breath dyspnea    Sleep apnea    doesn't use C-pap   Varicose veins     Past Surgical History:  Procedure Laterality Date   CHOLECYSTECTOMY N/A 10/22/2014   Procedure: LAPAROSCOPIC CHOLECYSTECTOMY WITH INTRAOPERATIVE CHOLANGIOGRAM;  Surgeon: Dia Crawford III, MD;  Location: [facility] ORS;  Service: General;  Laterality: N/A;   cyst removed      from lip as a child   INTRAMEDULLARY (IM) NAIL INTERTROCHANTERIC N/A 02/18/2019   Procedure: INTRAMEDULLARY (IM) NAIL INTERTROCHANTRIC, RIGHT,;  Surgeon: Thornton Park, MD;  Location: [facility] ORS;   Service: Orthopedics;  Laterality: N/A;   LUMBAR LAMINECTOMY/DECOMPRESSION MICRODISCECTOMY Bilateral 12/14/2012   Procedure: Bilateral lumbar three-four, four-five decompressive laminotomy/foraminotomy;  Surgeon: Charlie Pitter, MD;  Location: [place] [facility] ORS;  Service: Neurosurgery;  Laterality: Bilateral;   PULSE GENERATOR IMPLANT Bilateral 12/13/2013   Procedure: Bilateral implantable pulse generator placement;  Surgeon: Erline Levine, MD;  Location: [place] [facility];  Service: Neurosurgery;  Laterality: Bilateral;  Bilateral implantable pulse generator placement   skin cancer removed     from ears,   12 lft arm  rt leg 15   SUBTHALAMIC STIMULATOR BATTERY REPLACEMENT Bilateral 07/14/2017   Procedure: BILATERAL IMPLANTED PULSE GENERATOR CHANGE FOR DEEP BRAIN STIMULATOR;  Surgeon: Erline Levine, MD;  Location: [place];  Service: Neurosurgery;  Laterality: Bilateral;   SUBTHALAMIC STIMULATOR INSERTION Bilateral 12/06/2013   Procedure: SUBTHALAMIC STIMULATOR INSERTION;  Surgeon: Erline Levine, MD;  Location: [place] [facility] ORS;  Service: Neurosurgery;  Laterality: Bilateral;  Bilateral deep brain stimulator placement    There were no vitals filed for this visit.    Subjective Assessment - 12/08/20 1556     Subjective Pt coming back to clinic after self-DC 06/03/20 due to medical complications interfering with ease in getting to clinic.    Pertinent History [name] is  a 59yoM who comes to [place] [facility] for evaluation of conitnued difficulty of imbalance, unsteadiness on feet, and generally limited tolerance to mobility in the setting of chronic parkinsonism. SInce DC in February, pt has had PPM placement due to bradycardia issues. Pt also started using an UpWalker device 2 week prior to this evaluation, goal was to help with gradual melting of postural extension that occured with sustained upright actiivty. Pt has continued to practice supervised AMB with caregivers, short distances out of house and in community.  Pt continues to experience frequent falls on a weekly basis, at a rate that both he and wife report to be increasing in frequency. PMH: HTN, bradycardia s/p PPM, PD, DBS, COVID-19 infection, chronic low back pain s/p L4/5 surgery.    How long can you sit comfortably? Not limited    How long can you stand comfortably? 3-5 minutes with support    How long can you walk comfortably? ~5-6 minutes UpWalker Walker    Patient Stated Goals Reduce falls, improve AMB tolerance    Currently in Pain? Yes    Pain Score 6     Pain Location Back   s/p fall at home last week               [facility] PT Assessment - 12/08/20 0001       Assessment   Medical Diagnosis Parkinson's Disease    Referring Provider (PT) Elsie Stain, MD    Hand Dominance Right    Prior Therapy yes   at this clinic     Precautions   Precautions Fall;Other (comment)   orthostatic hypotension     Restrictions   Weight Bearing Restrictions No      Balance Screen   Has the patient fallen in the past 6 months Yes    How many times? --   unclear, extensive history of frequent falls   Has the patient had a decrease in activity level because of a fear of falling?  No    Is the patient reluctant to leave their home because of a fear of falling?  No      Home Social worker Private residence    Living Arrangements Spouse/significant other    Available Help at Discharge Family;Personal care attendant   1-4, 4d/week   Type of [place] One level    [place] - 4 wheels;Cane - single point;Grab bars - toilet;Grab bars - tub/shower;Wheelchair - Education administrator (comment);Shower seat - built in   [employer] (getting rid of); UpWalker August 2022   Additional Comments Difficulty with consistent caregivers in home      Prior Function   Level of Independence Needs assistance with ADLs;Needs assistance with homemaking;Needs assistance with gait;Requires  assistive device for independence   Fluctuating independence from supervision to maxA. Needs a 2 hand device for AMB   Vocation Retired      Associate Professor   Overall Cognitive Status History of cognitive impairments - at baseline   flat afect, delayed processing   Area of Impairment Following commands    Following Commands Follows one step commands consistently      Posture/Postural Control   Posture/Postural Control Postural limitations    Postural Limitations Increased thoracic kyphosis;Weight shift left   quick onset fatigue with sustained upright standing     Transfers   Transfers Sit to Stand    Sit to Stand 5: Supervision  Five time sit to stand comments  17.5sec   hands free from standard chair     Ambulation/Gait   Ambulation Distance (Feet) 600 Feet    Assistive device --   UpWalker   Gait Pattern Narrow base of support   adducted RLE with some scissoring in swing phase; no noted festination or freezing   Gait Comments ready for a rest after 4 laps, but not completely exhausted.   post AMB BP: 121/27mHg, 91bpm     Standardized Balance Assessment   Standardized Balance Assessment Mini-BESTest;10 meter walk test    10 Meter Walk 17.53sec   with UpWalker, self selected (0.559m)     Mini-BESTest   Sit To Stand Normal: Comes to stand without use of hands and stabilizes independently.    Rise to Toes Moderate: Heels up, but not full range (smaller than when holding hands), OR noticeable instability for 3 s.    Stand on one leg (left) Moderate: < 20 s    Stand on one leg (right) Severe: Unable    Stand on one leg - lowest score 0    Compensatory Stepping Correction - Forward Normal: Recovers independently with a single, large step (second realignement is allowed).    Compensatory Stepping Correction - Backward No step, OR would fall if not caught, OR falls spontaneously.    Compensatory Stepping Correction - Left Lateral Severe: Falls, or cannot step    Compensatory Stepping  Correction - Right Lateral Severe:  Falls, or cannot step    Stepping Corredtion Lateral - lowest score 0    Stance - Feet together, eyes open, firm surface  Normal: 30s    Stance - Feet together, eyes closed, foam surface  Moderate: < 30s   ~8sec   Incline - Eyes Closed Moderate: Stands independently < 30s OR aligns with surface   4sec   Change in Gait Speed Normal: Significantly changes walkling speed without imbalance    Walk with head turns - Horizontal Normal: performs head turns with no change in gait speed and good balance    Walk with pivot turns Moderate:Turns with feet close SLOW (>4 steps) with good balance.    Step over obstacles Moderate: Steps over box but touches box OR displays cautious behavior by slowing gait.    Timed UP & GO with Dual Task Severe: Stops counting while walking OR stops walking while counting.   TUG: 16.08sec; cogTUG: 22.23sec (counting by 3s, stops at 21)   Mini-BEST total score 15      Timed Up and Go Test   TUG Normal TUG;Cognitive TUG    Normal TUG (seconds) 17.53   rollator   Cognitive TUG (seconds) 22.23   rollator                   Vestibular Assessment - 12/08/20 0001       Orthostatics   BP sitting 139/76    HR sitting 64    BP standing (after 1 minute) 116/63   no symptoms   HR standing (after 1 minute) 61    BP standing (after 3 minutes) 121/80   post 60031fMB   HR standing (after 3 minutes) 91   post 600f55fB               Objective measurements completed on examination: See above findings.               PT Education - 12/08/20 1610     Education provided Yes  Education Details Role of PPM in preventing bradycardia, but limitation of not pacing faster for exercise    Person(s) Educated Spouse;Patient    Methods Explanation    Comprehension Verbalized understanding              PT Short Term Goals - 12/08/20 1649       PT SHORT TERM GOAL #1   Title Pt to demonstrate improved sustained AMB  tolerance >850f.    Baseline At evlauation: 6066ftolerance (not to failure)    Time 4    Period Weeks    Status New    Target Date 01/05/21      PT SHORT TERM GOAL #2   Title Pt to demonstrate MiSouris17/28 to show improvement in balance.    Baseline Eval: 15/28    Time 4    Period Weeks    Status New    Target Date 01/05/21      PT SHORT TERM GOAL #3   Title Pt to demonstrate 10MWT<13sec to show self-selected gait speed appropriate for AMB outside of house and to improve energy efficiency.    Baseline UpWalker Eval: 17.53sec    Time 4    Period Weeks    Status New    Target Date 01/05/21               PT Long Term Goals - 12/08/20 1654       PT LONG TERM GOAL #1   Title Pt to score MiniBesTest > 20/28 to show improved balance.    Baseline Eval: 15/28    Time 8    Period Weeks    Status New    Target Date 02/02/21      PT LONG TERM GOAL #2   Title Pt to tolerate 100052fustained AMB with UpWalker without rest interval to improve safety and independence in limited community AMB.    Baseline At eval; tired after 600f55f Time 8    Period Weeks    Status New    Target Date 02/02/21      PT LONG TERM GOAL #3   Title Pt to demonstrate improved FOTO score by 5 points compared to initital assessment.    Time 8    Period Weeks    Status New    Target Date 03/02/21      PT LONG TERM GOAL #4   Title Pt to show tolerance of AMB>1300ft58fh UpWalker to improve tolerance to accessing the community for IADL.    Baseline Eval: 600ft 50fime 12    Period Weeks    Status New    Target Date 03/02/21                    Plan - 12/08/20 1613     Clinical Impression Statement Pt presenting for PT evaluation to resume therapy services after recent progression in falls. Examination revealing of severe balance impairment AEB MinBesTest score. Pt still orthostatic in exam, but pressures recover to some degree over the course of AMB time. Examination of  gait speed, transfers power, and sustained walking tolerance are all without significant change since prior DC however still demonstrate limitations and risk for falls. Pt will benefit from skilled PT intervention to maximize balance and actiivty tolerance in order to reduce falls risk and improve safety for ADL/IADL related mobility.    Personal Factors and Comorbidities Age;Past/Current Experience;Time since onset of injury/illness/exacerbation;Transportation    Comorbidities Chronic back  pain, orthostatic hypotension, repeated falls, parkinsonism    Examination-Activity Limitations Bathing;Bed Mobility;Caring for Others;Bend;Dressing;Lift;Locomotion Level;Reach Overhead;Sit;Squat;Stairs;Stand;Toileting;Transfers;Carry    Examination-Participation Restrictions Cleaning;Community Activity;Interpersonal Relationship;Shop;Volunteer;Yard Work;Other;Driving    Stability/Clinical Decision Making Evolving/Moderate complexity    Clinical Decision Making Moderate    Rehab Potential Fair    PT Frequency 2x / week    PT Duration 12 weeks    PT Treatment/Interventions ADLs/Self Care Home Management;Aquatic Therapy;Cryotherapy;Electrical Stimulation;Iontophoresis '4mg'$ /ml Dexamethasone;Moist Heat;Ultrasound;Contrast Bath;DME Instruction;Gait training;Stair training;Functional mobility training;Therapeutic activities;Therapeutic exercise;Balance training;Neuromuscular re-education;Patient/family education;Manual techniques;Wheelchair mobility training;Energy conservation;Passive range of motion;Traction;Orthotic Fit/Training;Dry needling;Vestibular;Visual/perceptual remediation/compensation;Taping;Canalith Repostioning;Cognitive remediation    PT Next Visit Plan Advanced AMB tolerance, use MiniBesTest to guide balance interventions    PT Home Exercise Plan None set up at evaluation, still working on previous HEP from 6 months prior (STS, daily walking)    Consulted and Agree with Plan of Care Patient;Family  member/caregiver    Family Member Consulted Wife             Patient will benefit from skilled therapeutic intervention in order to improve the following deficits and impairments:  Abnormal gait, Decreased balance, Decreased coordination, Decreased mobility, Postural dysfunction, Decreased strength, Decreased safety awareness, Improper body mechanics, Impaired flexibility, Decreased activity tolerance, Decreased endurance, Decreased knowledge of precautions, Difficulty walking, Pain, Cardiopulmonary status limiting activity, Decreased range of motion, Impaired perceived functional ability, Decreased cognition, Dizziness  Visit Diagnosis: Repeated falls  Other abnormalities of gait and mobility     Problem List Patient Active Problem List   Diagnosis Date Noted   Medicare annual wellness visit, subsequent 11/18/2020   Pacemaker    Dementia associated with Parkinson's disease ([place]) 06/17/2020   Vertigo 02/19/2020   Urine abnormality 02/19/2020   Dysphagia 02/23/2019   Fall at home 10/18/2017   REM behavioral disorder 11/02/2016   Radicular pain in right arm 10/09/2016   GERD (gastroesophageal reflux disease) 07/31/2016   Colon cancer screening 07/23/2015   Gout 02/18/2015   Depression 01/06/2015   Skin lesion 10/02/2014   Dupuytren's contracture 08/20/2014   Cough 07/21/2014   Lumbar stenosis with neurogenic claudication 06/13/2014   S/P deep brain stimulator placement 05/08/2014   Advance care planning 01/19/2014   Parkinson's disease ([place]) 12/13/2013   PTSD (post-traumatic stress disorder) 06/13/2013   Erectile dysfunction 06/13/2013   HLD (hyperlipidemia) 06/13/2013   Essential hypertension 06/03/2013   Bradycardia by electrocardiogram 06/03/2013   Obstructive sleep apnea 03/12/2013   5:06 PM, 12/08/20 Etta Grandchild, PT, DPT Physical Therapist - [facility]  Outpatient Physical Therapy- [place] [phone]      Etta Grandchild 12/08/2020, 5:05 PM  [place] MAIN [facility] SERVICES [address] [place], [place], [address] Phone: [phone]   Fax:  [phone]  Name: JARVIN CARDONA MRN: [id number] Date of Birth: 25-Jun-1950

## 2020-12-08 NOTE — Telephone Encounter (Signed)
Noted. Thanks.

## 2020-12-09 ENCOUNTER — Ambulatory Visit (INDEPENDENT_AMBULATORY_CARE_PROVIDER_SITE_OTHER): Payer: Medicare PPO | Admitting: Family Medicine

## 2020-12-09 VITALS — BP 120/78 | HR 85 | Temp 98.0°F | Wt 181.5 lb

## 2020-12-09 DIAGNOSIS — J3489 Other specified disorders of nose and nasal sinuses: Secondary | ICD-10-CM

## 2020-12-09 DIAGNOSIS — J029 Acute pharyngitis, unspecified: Secondary | ICD-10-CM

## 2020-12-09 DIAGNOSIS — H9203 Otalgia, bilateral: Secondary | ICD-10-CM

## 2020-12-09 NOTE — Patient Instructions (Signed)
Sore throat and ear pain - suspect this may be due to congestion/allergies - try claritin or zyrtec (store brand fine) - can also try flonase (nasal spray) - store brand OK  If fevers, chills, ear drainage, worsening ear pain or pressure  DO NOT use q-tips

## 2020-12-09 NOTE — Progress Notes (Addendum)
Agree.  Thanks.   Physician Documentation  Your signature is required to indicate approval of the treatment plan as stated above. By signing this report, you are approving the plan of care. Please sign and either send electronically or print and fax the signed copy to the number below. If you approve with modifications, please indicate those in the space provided._ Physician Signature: ___Graham Duncan______  Date:12/09/20  Time: 10:40 AM

## 2020-12-09 NOTE — Assessment & Plan Note (Signed)
Pt reports chronic sinus drainage, now with ear pain and sore throat. Discussed trial of daily allergy pill and flonase. If no improvement - call. Suspect sore throat (neg covid and strept 2/2 to drainage)

## 2020-12-09 NOTE — Telephone Encounter (Signed)
See note from today

## 2020-12-09 NOTE — Progress Notes (Signed)
Subjective:     [name] is a 70 y.o. male presenting for Ear Pain (X 2 weeks. Discovered dry blood in both ears yesterday. )     HPI  #Ear pain - sore throat - went to [facility] on 12/02/2020 - normal TM per note, negative - negative strept test - got darker discharge with q-tips when cleaning ears yesterday - hearing OK - no ringing - no fever or chills  - no active bleeding from the ears - no ear drainage - symptoms are waxing and waning over the last 2 weeks    Review of Systems   Social History   Tobacco Use  Smoking Status Never  Smokeless Tobacco Former        Objective:    BP Readings from Last 3 Encounters:  12/09/20 120/78  12/02/20 123/83  11/25/20 138/82   Wt Readings from Last 3 Encounters:  12/09/20 181 lb 8 oz (82.3 kg)  11/25/20 181 lb (82.1 kg)  11/17/20 182 lb (82.6 kg)    BP 120/78   Pulse 85   Temp 98 F (36.7 C) (Temporal)   Wt 181 lb 8 oz (82.3 kg)   SpO2 100%   BMI 26.80 kg/m    Physical Exam Constitutional:      General: He is not in acute distress.    Appearance: He is well-developed. He is not ill-appearing.  HENT:     Head: Normocephalic and atraumatic.     Right Ear: Tympanic membrane normal. No laceration, drainage or tenderness.     Left Ear: Tympanic membrane and ear canal normal. No laceration, drainage or tenderness.     Ears:     Comments: Right ear canal with dried blood, no erythema or active drainage.     Nose: Mucosal edema and rhinorrhea present.     Right Sinus: No maxillary sinus tenderness or frontal sinus tenderness.     Left Sinus: No maxillary sinus tenderness or frontal sinus tenderness.     Mouth/Throat:     Pharynx: Uvula midline. Posterior oropharyngeal erythema present. No oropharyngeal exudate.     Tonsils: 0 on the right. 0 on the left.  Cardiovascular:     Rate and Rhythm: Normal rate and regular rhythm.     Heart sounds: No murmur heard. Pulmonary:     Effort: Pulmonary effort is  normal. No respiratory distress.     Breath sounds: Normal breath sounds.  Musculoskeletal:     Cervical back: Neck supple.  Lymphadenopathy:     Cervical: No cervical adenopathy.  Skin:    General: Skin is warm and dry.     Capillary Refill: Capillary refill takes less than 2 seconds.  Neurological:     Mental Status: He is alert.          Assessment & Plan:   Problem List Items Addressed This Visit       Other   Sinus drainage - Primary    Pt reports chronic sinus drainage, now with ear pain and sore throat. Discussed trial of daily allergy pill and flonase. If no improvement - call. Suspect sore throat (neg covid and strept 2/2 to drainage)      Other Visit Diagnoses     Sore throat       Otalgia of both ears          Ear pain - some dried blood, advised against q-tips. No sign of internal/external ear infection on exam. Suspect some possible minor ear trauma  led to bleeding/scab and advised watch and wait. If new bleeding notify   Return if symptoms worsen or fail to improve.  Lesleigh Noe, MD  This visit occurred during the SARS-CoV-2 public health emergency.  Safety protocols were in place, including screening questions prior to the visit, additional usage of staff PPE, and extensive cleaning of exam room while observing appropriate contact time as indicated for disinfecting solutions.

## 2020-12-10 ENCOUNTER — Other Ambulatory Visit: Payer: Self-pay

## 2020-12-10 ENCOUNTER — Ambulatory Visit (INDEPENDENT_AMBULATORY_CARE_PROVIDER_SITE_OTHER): Payer: Medicare PPO | Admitting: Urology

## 2020-12-10 ENCOUNTER — Encounter: Payer: Self-pay | Admitting: Urology

## 2020-12-10 VITALS — BP 160/78 | HR 85 | Ht 68.0 in | Wt 182.0 lb

## 2020-12-10 DIAGNOSIS — N3281 Overactive bladder: Secondary | ICD-10-CM

## 2020-12-10 DIAGNOSIS — N529 Male erectile dysfunction, unspecified: Secondary | ICD-10-CM

## 2020-12-10 NOTE — Progress Notes (Signed)
12/10/20 8:51 AM   [name] 03/26/1951 [id number]  CC: Urinary symptoms, ED  HPI: Comorbid 70 year old male with Parkinson's disease who reports 5+ years of erectile dysfunction, as well as urinary symptoms of urgency and dark yellow urine.  The majority of the history is obtained from his wife, as he has significant disability from his Parkinson's disease.  In terms of his urinary symptoms, he reportedly drinks very little during the day, and urine is dark yellow consistent with dehydration.  He denies any burning or difficulty urinating, and has some mild urgency.  He has rare urge incontinence.  He wears depends because of fecal incontinence.  He has not had any UTIs, and recent urine culture was benign.  He drinks primarily tea and soda during the day.  Regarding his erectile dysfunction, this has been present over 5 years.  He has tried multiple strategies including Viagra, Cialis, and the vacuum erection device without any significant improvement.   PMH: Past Medical History:  Diagnosis Date   Arthritis    Bradycardia    Cancer ([place]) 2013   skin cancer   Depression    ptsd   Dysrhythmia    chronic slow heart rate   GERD (gastroesophageal reflux disease)    Headache(784.0)    tension headaches non recent   History of chicken pox    History of kidney stones    passed   Hypertension    treated with HCTZ   Pacemaker    Parkinson's disease ([place])    dx'ed 15 years ago   PTSD (post-traumatic stress disorder)    Shortness of breath dyspnea    Sleep apnea    doesn't use C-pap   Varicose veins     Surgical History: Past Surgical History:  Procedure Laterality Date   CHOLECYSTECTOMY N/A 10/22/2014   Procedure: LAPAROSCOPIC CHOLECYSTECTOMY WITH INTRAOPERATIVE CHOLANGIOGRAM;  Surgeon: Dia Crawford III, MD;  Location: [facility];  Service: General;  Laterality: N/A;   cyst removed      from lip as a child   INTRAMEDULLARY (IM) NAIL INTERTROCHANTERIC N/A 02/18/2019    Procedure: INTRAMEDULLARY (IM) NAIL INTERTROCHANTRIC, RIGHT,;  Surgeon: Thornton Park, MD;  Location: [facility];  Service: Orthopedics;  Laterality: N/A;   LUMBAR LAMINECTOMY/DECOMPRESSION MICRODISCECTOMY Bilateral 12/14/2012   Procedure: Bilateral lumbar three-four, four-five decompressive laminotomy/foraminotomy;  Surgeon: Charlie Pitter, MD;  Location: [place] [facility];  Service: Neurosurgery;  Laterality: Bilateral;   PULSE GENERATOR IMPLANT Bilateral 12/13/2013   Procedure: Bilateral implantable pulse generator placement;  Surgeon: Erline Levine, MD;  Location: [place] [facility];  Service: Neurosurgery;  Laterality: Bilateral;  Bilateral implantable pulse generator placement   skin cancer removed     from ears,   12 lft arm  rt leg 15   SUBTHALAMIC STIMULATOR BATTERY REPLACEMENT Bilateral 07/14/2017   Procedure: BILATERAL IMPLANTED PULSE GENERATOR CHANGE FOR DEEP BRAIN STIMULATOR;  Surgeon: Erline Levine, MD;  Location: [place];  Service: Neurosurgery;  Laterality: Bilateral;   SUBTHALAMIC STIMULATOR INSERTION Bilateral 12/06/2013   Procedure: SUBTHALAMIC STIMULATOR INSERTION;  Surgeon: Erline Levine, MD;  Location: [place] [facility];  Service: Neurosurgery;  Laterality: Bilateral;  Bilateral deep brain stimulator placement    Family History: Family History  Problem Relation Age of Onset   Lung cancer Father    Colon cancer Neg Hx    Prostate cancer Neg Hx     Social History:  reports that he has never smoked. He has quit using smokeless tobacco. He reports current alcohol use. He  reports that he does not use drugs.  Physical Exam: BP (!) 160/78   Pulse 85   Ht '5\' 8"'$  (1.727 m)   Wt 182 lb (82.6 kg)   BMI 27.67 kg/m    Constitutional: Flat affect Cardiovascular: No clubbing, cyanosis, or edema. Respiratory: Normal respiratory effort, no increased work of breathing. GI: Abdomen is soft, nontender, nondistended, no abdominal masses Gu:    Laboratory Data: Reviewed, see HPI  Pertinent  Imaging: None to review  Assessment & Plan:   70 year old frail male with significant Parkinson's disease with erectile dysfunction over the last 5 years as well as mild overactive urinary symptoms likely secondary to high intake of soda and tea.  We reviewed behavioral strategies regarding his urinary symptoms, and it sounds like he has minimally bothered at this time.  Would avoid any anticholinergics with his frailty and Parkinson's, could consider Myrbetriq in the future if worsening urge incontinence.  In terms of the erections we discussed other options like penile injections or penile prosthesis.  He and his wife are unsure how they would like to proceed, but are interested in having the test dose sent to pharmacy in case they opt to pursue intracavernosal penile injection teaching.  Risks and benefits discussed.  Trimix test dose sent to pharmacy, they will call to schedule injection teaching if they opt to pursue this   Nickolas Madrid, MD 12/10/2020  [facility] [address] [place], [place] [address] [phone]

## 2020-12-10 NOTE — Addendum Note (Signed)
Addended by: Stormy Fabian B on: 12/10/2020 10:52 AM   Modules accepted: Orders

## 2020-12-10 NOTE — Therapy (Signed)
[address] MAIN [facility] SERVICES [address] [place], [place], [address] Phone: [phone]   Fax:  [phone]  Speech Language Pathology Evaluation  Patient Details  Name: [name] MRN: [id number] Date of Birth: Feb 26, 1951 Referring Provider (SLP): Elsie Stain   Encounter Date: 12/08/2020   End of Session - 12/10/20 0759     Visit Number 1    Number of Visits 17    Date for SLP Re-Evaluation 03/04/21    Authorization Type [employer] Choice PPO    Authorization Time Period 12/08/2020 thru 03/04/2021    Authorization - Visit Number 1    Progress Note Due on Visit 10    SLP Start Time 1000    SLP Stop Time  1100    SLP Time Calculation (min) 60 min    Activity Tolerance Patient tolerated treatment well             Past Medical History:  Diagnosis Date   Arthritis    Bradycardia    Cancer ([place]) 2013   skin cancer   Depression    ptsd   Dysrhythmia    chronic slow heart rate   GERD (gastroesophageal reflux disease)    Headache(784.0)    tension headaches non recent   History of chicken pox    History of kidney stones    passed   Hypertension    treated with HCTZ   Pacemaker    Parkinson's disease ([place])    dx'ed 15 years ago   PTSD (post-traumatic stress disorder)    Shortness of breath dyspnea    Sleep apnea    doesn't use C-pap   Varicose veins     Past Surgical History:  Procedure Laterality Date   CHOLECYSTECTOMY N/A 10/22/2014   Procedure: LAPAROSCOPIC CHOLECYSTECTOMY WITH INTRAOPERATIVE CHOLANGIOGRAM;  Surgeon: Dia Crawford III, MD;  Location: [facility];  Service: General;  Laterality: N/A;   cyst removed      from lip as a child   INTRAMEDULLARY (IM) NAIL INTERTROCHANTERIC N/A 02/18/2019   Procedure: INTRAMEDULLARY (IM) NAIL INTERTROCHANTRIC, RIGHT,;  Surgeon: Thornton Park, MD;  Location: [facility];  Service: Orthopedics;  Laterality: N/A;   LUMBAR LAMINECTOMY/DECOMPRESSION MICRODISCECTOMY Bilateral  12/14/2012   Procedure: Bilateral lumbar three-four, four-five decompressive laminotomy/foraminotomy;  Surgeon: Charlie Pitter, MD;  Location: [place] [facility];  Service: Neurosurgery;  Laterality: Bilateral;   PULSE GENERATOR IMPLANT Bilateral 12/13/2013   Procedure: Bilateral implantable pulse generator placement;  Surgeon: Erline Levine, MD;  Location: [place] [facility];  Service: Neurosurgery;  Laterality: Bilateral;  Bilateral implantable pulse generator placement   skin cancer removed     from ears,   12 lft arm  rt leg 15   SUBTHALAMIC STIMULATOR BATTERY REPLACEMENT Bilateral 07/14/2017   Procedure: BILATERAL IMPLANTED PULSE GENERATOR CHANGE FOR DEEP BRAIN STIMULATOR;  Surgeon: Erline Levine, MD;  Location: [place];  Service: Neurosurgery;  Laterality: Bilateral;   SUBTHALAMIC STIMULATOR INSERTION Bilateral 12/06/2013   Procedure: SUBTHALAMIC STIMULATOR INSERTION;  Surgeon: Erline Levine, MD;  Location: [place] [facility];  Service: Neurosurgery;  Laterality: Bilateral;  Bilateral deep brain stimulator placement    There were no vitals filed for this visit.   Subjective Assessment - 12/10/20 0746     Subjective pt pleasant, accompanied by his wife    Patient is accompained by: Family member    Currently in Pain? No/denies                SLP Evaluation [facility] - 12/10/20  [place]   SLP Received On 12/10/20    Referring Provider (SLP) Elsie Stain    Onset Date 11/17/2020    Medical Diagnosis Dementia associated with Parkinson's Disease      General Information   HPI [name] is a 70 y.o. male with a known history of essential hypertension, bradycardia, PTSD, Parkinson's disease status post brain stimulator, dementia associated with Parkinson's disease, REM behavioral disorder who presents for formal cognitive linguistic evaluation.    Behavioral/Cognition flat affect, delayed response time    Mobility Status ambulatory with rollator      Prior Functional Status    Cognitive/Linguistic Baseline Baseline deficits    Baseline deficit details increased cognitive difficulty in last 5 years    Type of Home House     Lives With Spouse    Available Support Family;Personal care attendant   4xweek   Vocation On disability      Cognition   Overall Cognitive Status Impaired/Different from baseline    Area of Impairment Attention;Memory;Following commands;Awareness;Problem solving    Current Attention Level Sustained    Memory Decreased short-term memory    Following Commands Follows one step commands consistently    Awareness Intellectual    Problem Solving Slow processing;Decreased initiation;Difficulty sequencing;Requires verbal cues;Requires tactile cues    Attention Sustained    Sustained Attention Impaired    Sustained Attention Impairment Verbal complex;Functional complex    Memory Impaired    Memory Impairment Retrieval deficit;Decreased recall of new information;Decreased short term memory;Prospective memory    Decreased Short Term Memory Verbal basic;Functional basic    Awareness Impaired    Awareness Impairment Intellectual impairment    Problem Solving Impaired    Problem Solving Impairment Verbal basic;Functional basic    Executive Function --   all impaired by lower level deficits     Auditory Comprehension   Overall Auditory Comprehension Appears within functional limits for tasks assessed      Visual Recognition/Discrimination   Discrimination --   unable to assess d/t reported blurred vision     Reading Comprehension   Reading Status Unable to assess (comment)   d/t blurred vision     Expression   Primary Mode of Expression Verbal      Verbal Expression   Overall Verbal Expression Impaired    Initiation Impaired    Automatic Speech Name    Level of Generative/Spontaneous Verbalization Phrase;Sentence    Repetition No impairment    Naming No impairment    Pragmatics Impairment    Impairments Abnormal affect;Dysprosody;Eye  contact;Monotone    Interfering Components Attention    Non-Verbal Means of Communication Not applicable      Written Expression   Dominant Hand Right    Written Expression Unable to assess (comment)   d/t RUE deficits     Oral Motor/Sensory Function   Overall Oral Motor/Sensory Function Appears within functional limits for tasks assessed      Motor Speech   Overall Motor Speech Impaired    Respiration Within functional limits    Phonation Low vocal intensity    Resonance Within functional limits    Articulation Impaired    Level of Impairment Conversation    Intelligibility Intelligibility reduced    Word 75-100% accurate    Phrase 75-100% accurate    Sentence 75-100% accurate    Conversation 75-100% accurate    Motor Planning Impaired    Level of Impairment Science writer Errors  Not applicable    Effective Techniques Increased vocal intensity;Slow rate    Phonation Impaired    Volume Soft    Pitch Low                             SLP Education - 12/10/20 0759     Education Details ST POC    Person(s) Educated Patient;Spouse    Methods Explanation    Comprehension Verbalized understanding              SLP Short Term Goals - 12/10/20 0801       SLP SHORT TERM GOAL #1   Title Pt will maintain speech volume appropriate for 80% intelligibilty in 3 minutes simple conversation with rare min A.    Baseline < 75%    Time 10    Period --   sessions   Status New      SLP SHORT TERM GOAL #2   Title Pt will use 1 external compensatory memory aid to recall orientation information with minimal assistance.    Baseline not currently using compensatory memory aids    Time 10    Period --   sessions   Status New      SLP SHORT TERM GOAL #3   Title Pt will sustain attention to basic task for 5 minutes with moderate assistance.    Baseline < 2 minutes of sustained attention    Time 10    Period --   sessions   Status New      SLP  SHORT TERM GOAL #4   Title --              SLP Long Term Goals - 12/10/20 [id number]       SLP LONG TERM GOAL #1   Title Pt will use speech intelligibility strategies to achieve ~ 80% speech intelligiblity at the sentence level with intermittent supervision cues.    Baseline <75% speech intelligibility    Time 8    Period Weeks    Status New    Target Date 03/04/21      SLP LONG TERM GOAL #2   Title Patient will identify cognitive-communication barriers and participate in developing functional compensatory strategies.    Baseline new goal    Time 8    Period Weeks    Status New    Target Date 03/04/21      SLP LONG TERM GOAL #3   Title Pt will complete cognitive communication HEP.    Baseline new goal    Time 8    Period Weeks    Status New    Target Date 03/04/21              Plan - 12/10/20 0801     Clinical Impression Statement Pt presents with cognitive communication deficits that per pt and his wife's report have worsened over the last several years. Their report concerns with significantly increased processing times, attention to task and memory. Pt and his wife are also in the process of having pt see his optometrist d/t blurred vision. Results of today's formal cognitive linguistic evaluation reveal impairments in memory (storage, retrieval, short-term, delayed recall, prospective), sustained attention, delayed processing (~ 1 minute delay prior to responding), off-topic comments that impact pt's ability to complete basic problem solving tasks as well as his ability to communicate wants/needs. Pt's speech intelligibility is reduced to ~ 75% at the sentence d/t imprecise articulation and  decreased vocal intensity. Additionally, pt presents with flat affect, decreased eye contact, decreased social/verbal/task initiation. At baseline, pt has a paid caregiver 4xweek and they are not currently using any compensatory strategies to reduced barriers to pt's functional  independence. Prognosis for restorative therapy is guarded, however, skilled ST intervention is required for cognitive training to create compensatory strategies to decrease current cognitive communication barriers and increase pt's functional independence.    Speech Therapy Frequency 2x / week    Duration 8 weeks    Treatment/Interventions Compensatory techniques;Internal/external aids;Functional tasks;SLP instruction and feedback;Patient/family education    Potential to Achieve Goals Fair    Potential Considerations Ability to learn/carryover information;Medical prognosis;Severity of impairments;Co-morbidities;Previous level of function    Consulted and Agree with Plan of Care Patient;Family member/caregiver    Family Member Consulted [name]             Patient will benefit from skilled therapeutic intervention in order to improve the following deficits and impairments:   Cognitive communication deficit  Dysarthria and anarthria  Dementia associated with Parkinson's disease ([place])  Parkinson's disease ([place])    Problem List Patient Active Problem List   Diagnosis Date Noted   Sinus drainage 12/09/2020   Medicare annual wellness visit, subsequent 11/18/2020   Pacemaker    Dementia associated with Parkinson's disease ([place]) 06/17/2020   Vertigo 02/19/2020   Urine abnormality 02/19/2020   Dysphagia 02/23/2019   Fall at home 10/18/2017   REM behavioral disorder 11/02/2016   Radicular pain in right arm 10/09/2016   GERD (gastroesophageal reflux disease) 07/31/2016   Colon cancer screening 07/23/2015   Gout 02/18/2015   Depression 01/06/2015   Skin lesion 10/02/2014   Dupuytren's contracture 08/20/2014   Cough 07/21/2014   Lumbar stenosis with neurogenic claudication 06/13/2014   S/P deep brain stimulator placement 05/08/2014   Advance care planning 01/19/2014   Parkinson's disease ([place]) 12/13/2013   PTSD (post-traumatic stress disorder) 06/13/2013   Erectile  dysfunction 06/13/2013   HLD (hyperlipidemia) 06/13/2013   Essential hypertension 06/03/2013   Bradycardia by electrocardiogram 06/03/2013   Obstructive sleep apnea 03/12/2013   Emalie Mcwethy B. Rutherford Nail M.S., CCC-SLP, [place] [facility]ologist [facility] Office [phone]  Stormy Fabian 12/10/2020, 8:31 AM  [place] MAIN [facility] SERVICES [address] [place], [place], [address] Phone: [phone]   Fax:  [phone]  Name: COLTEN HUTCHINGS MRN: [id number] Date of Birth: 1950-08-13

## 2020-12-11 NOTE — Progress Notes (Signed)
Agree.  Thanks.    Physician Documentation  Your signature is required to indicate approval of the treatment plan as stated above. By signing this report, you are approving the plan of care. Please sign and either send electronically or print and fax the signed copy to the number below. If you approve with modifications, please indicate those in the space provided.    Physician Signature: ________Graham Damita Dunnings   Date:___08/26/22 Time:__1:45 PM ___

## 2020-12-14 ENCOUNTER — Other Ambulatory Visit: Payer: Self-pay

## 2020-12-14 ENCOUNTER — Ambulatory Visit: Payer: Medicare PPO

## 2020-12-14 DIAGNOSIS — M6281 Muscle weakness (generalized): Secondary | ICD-10-CM | POA: Diagnosis not present

## 2020-12-14 DIAGNOSIS — G20 Parkinson's disease: Secondary | ICD-10-CM | POA: Diagnosis not present

## 2020-12-14 DIAGNOSIS — R296 Repeated falls: Secondary | ICD-10-CM | POA: Diagnosis not present

## 2020-12-14 DIAGNOSIS — R2681 Unsteadiness on feet: Secondary | ICD-10-CM

## 2020-12-14 DIAGNOSIS — R278 Other lack of coordination: Secondary | ICD-10-CM | POA: Diagnosis not present

## 2020-12-14 DIAGNOSIS — R471 Dysarthria and anarthria: Secondary | ICD-10-CM | POA: Diagnosis not present

## 2020-12-14 DIAGNOSIS — R2689 Other abnormalities of gait and mobility: Secondary | ICD-10-CM

## 2020-12-14 DIAGNOSIS — R41841 Cognitive communication deficit: Secondary | ICD-10-CM | POA: Diagnosis not present

## 2020-12-14 DIAGNOSIS — F0280 Dementia in other diseases classified elsewhere without behavioral disturbance: Secondary | ICD-10-CM | POA: Diagnosis not present

## 2020-12-14 NOTE — Therapy (Signed)
[place] MAIN [facility] SERVICES [address] [place], [place], [address] Phone: [phone]   Fax:  [phone]  Physical Therapy Treatment  Patient Details  Name: [name] MRN: [id number] Date of Birth: 12-Nov-1950 Referring Provider (PT): Elsie Stain, MD   Encounter Date: 12/14/2020   PT End of Session - 12/14/20 1025     Visit Number 2    Number of Visits 24    Date for PT Re-Evaluation 01/05/21    Authorization Type [employer] PPO    Authorization Time Period 12/08/20-03/02/21    Progress Note Due on Visit 10    PT Start Time 0932    PT Stop Time 1017    PT Time Calculation (min) 45 min    Equipment Utilized During Treatment Gait belt    Activity Tolerance Patient tolerated treatment well;No increased pain;Patient limited by fatigue    Behavior During Therapy [facility] for tasks assessed/performed             Past Medical History:  Diagnosis Date   Arthritis    Bradycardia    Cancer ([place]) 2013   skin cancer   Depression    ptsd   Dysrhythmia    chronic slow heart rate   GERD (gastroesophageal reflux disease)    Headache(784.0)    tension headaches non recent   History of chicken pox    History of kidney stones    passed   Hypertension    treated with HCTZ   Pacemaker    Parkinson's disease ([place])    dx'ed 15 years ago   PTSD (post-traumatic stress disorder)    Shortness of breath dyspnea    Sleep apnea    doesn't use C-pap   Varicose veins     Past Surgical History:  Procedure Laterality Date   CHOLECYSTECTOMY N/A 10/22/2014   Procedure: LAPAROSCOPIC CHOLECYSTECTOMY WITH INTRAOPERATIVE CHOLANGIOGRAM;  Surgeon: Dia Crawford III, MD;  Location: [facility] ORS;  Service: General;  Laterality: N/A;   cyst removed      from lip as a child   INTRAMEDULLARY (IM) NAIL INTERTROCHANTERIC N/A 02/18/2019   Procedure: INTRAMEDULLARY (IM) NAIL INTERTROCHANTRIC, RIGHT,;  Surgeon: Thornton Park, MD;  Location: [facility] ORS;   Service: Orthopedics;  Laterality: N/A;   LUMBAR LAMINECTOMY/DECOMPRESSION MICRODISCECTOMY Bilateral 12/14/2012   Procedure: Bilateral lumbar three-four, four-five decompressive laminotomy/foraminotomy;  Surgeon: Charlie Pitter, MD;  Location: [place] [facility];  Service: Neurosurgery;  Laterality: Bilateral;   PULSE GENERATOR IMPLANT Bilateral 12/13/2013   Procedure: Bilateral implantable pulse generator placement;  Surgeon: Erline Levine, MD;  Location: [place] [facility];  Service: Neurosurgery;  Laterality: Bilateral;  Bilateral implantable pulse generator placement   skin cancer removed     from ears,   12 lft arm  rt leg 15   SUBTHALAMIC STIMULATOR BATTERY REPLACEMENT Bilateral 07/14/2017   Procedure: BILATERAL IMPLANTED PULSE GENERATOR CHANGE FOR DEEP BRAIN STIMULATOR;  Surgeon: Erline Levine, MD;  Location: [place];  Service: Neurosurgery;  Laterality: Bilateral;   SUBTHALAMIC STIMULATOR INSERTION Bilateral 12/06/2013   Procedure: SUBTHALAMIC STIMULATOR INSERTION;  Surgeon: Erline Levine, MD;  Location: [place] [facility] OR[facility];  Service: Neurosurgery;  Laterality: Bilateral;  Bilateral deep brain stimulator placement    There were no vitals filed for this visit.   Subjective Assessment - 12/14/20 0935     Subjective Pt spouse with pt at session. Reports no falls since previous session. Pt reports 6/10 back pain (chronic issue).    Patient is accompained by: Family member  Pertinent History [name] is a 10yoM who comes to [facility] neuro for evaluation of conitnued difficulty of imbalance, unsteadiness on feet, and generally limited tolerance to mobility in the setting of chronic parkinsonism. SInce DC in February, pt has had PPM placement due to bradycardia issues. Pt also started using an UpWalker device 2 week prior to this evaluation, goal was to help with gradual melting of postural extension that occured with sustained upright actiivty. Pt has continued to practice supervised AMB with caregivers, short  distances out of house and in community. Pt continues to experience frequent falls on a weekly basis, at a rate that both he and wife report to be increasing in frequency. PMH: HTN, bradycardia s/p PPM, PD, DBS, COVID-19 infection, chronic low back pain s/p L4/5 surgery.    Limitations Walking;Standing;Sitting;House hold activities;Lifting    How long can you sit comfortably? Not limited    How long can you stand comfortably? 3-5 minutes with support    How long can you walk comfortably? ~5-6 minutes UpWalker Walker    Patient Stated Goals Reduce falls, improve AMB tolerance    Currently in Pain? Yes    Pain Score 6     Pain Location Back    Pain Onset More than a month ago    Pain Onset More than a month ago           INTERVENTIONS-  HR at beginning of session: 61 bpm  Further assessment-  FOTO 50 (goal 51)  MMT- grossly 4/5 BLEs  STS from standard height chair, emphasis on large amplitude movement -3x10. VC/TV/demo provided, frequent cues for upright posture. Pt rates medium.  PT instructs pt in applying large-amplitude/high effort technique with STS at home. Instructs pt to perform at support surface. Pt verbalizes understanding (this was provided as part of HEP).   In // bars:  With BUE, ambulating forward/backward with emphasis on increased step-length/height, cuing for upright posture. Pt performs multiple reps.   Standing WBOS, firm surface, no UE support -  2x30 sec. Mild increase in sway.  Standing NBOS, firm surface - 2x30 sec, intermittent UE support. Decreased postural stability. Rates medium  Stepping forward/backward over half-foam. Cuing for maintaining hip-width stance with retro-stepping as pt tends to adduct BLEs. Pt performs multiple reps. Heavy BUE support.     PT provides cuing throughout session in the form of VC/TC/demo to facilitate movement at target joints and correct muscle activation with exercises. Pt shows improvement following cues within  session.    Plan - 12/14/20 1022     Clinical Impression Statement Further assessment completed on this date. Pt MMT reveals BLEs grossly 4/5. Other part of session focused on educating pt on importance/purpose of large-amplitude movements, particularly with transfers. Pt with fair ability to correct with cuing this session to increase amplitude of movement/effort. The pt also has difficulty with NBOS stance and tends to assume this position with taking steps. The pt will benefit from further skilled PT to improve LE strength, balance and gait to decrease fall risk.    Personal Factors and Comorbidities Age;Past/Current Experience;Time since onset of injury/illness/exacerbation;Transportation    Comorbidities Chronic back pain, orthostatic hypotension, repeated falls, parkinsonism    Examination-Activity Limitations Bathing;Bed Mobility;Caring for Others;Bend;Dressing;Lift;Locomotion Level;Reach Overhead;Sit;Squat;Stairs;Stand;Toileting;Transfers;Carry    Examination-Participation Restrictions Cleaning;Community Activity;Interpersonal Relationship;Shop;Volunteer;Yard Work;Other;Driving    Stability/Clinical Decision Making Evolving/Moderate complexity    Rehab Potential Fair    PT Frequency 2x / week    PT Duration 12 weeks    PT  Treatment/Interventions ADLs/Self Care Home Management;Aquatic Therapy;Cryotherapy;Electrical Stimulation;Iontophoresis '4mg'$ /ml Dexamethasone;Moist Heat;Ultrasound;Contrast Bath;DME Instruction;Gait training;Stair training;Functional mobility training;Therapeutic activities;Therapeutic exercise;Balance training;Neuromuscular re-education;Patient/family education;Manual techniques;Wheelchair mobility training;Energy conservation;Passive range of motion;Traction;Orthotic Fit/Training;Dry needling;Vestibular;Visual/perceptual remediation/compensation;Taping;Canalith Repostioning;Cognitive remediation    PT Next Visit Plan Advanced AMB tolerance, use MiniBesTest to guide balance  interventions    PT Home Exercise Plan None set up at evaluation, still working on previous HEP from 6 months prior (STS, daily walking); 8/29: issued STS with focus on large amplitude movement at support surface    Consulted and Agree with Plan of Care Patient;Family member/caregiver    Family Member Consulted Wife               PT Education - 12/14/20 1710     Education provided Yes    Education Details exercise technique, body mechanics, large-amplitude trainging    Person(s) Educated Patient    Methods Explanation;Demonstration;Tactile cues;Verbal cues    Comprehension Verbalized understanding;Returned demonstration;Need further instruction              PT Short Term Goals - 12/08/20 1649       PT SHORT TERM GOAL #1   Title Pt to demonstrate improved sustained AMB tolerance >885f.    Baseline At evlauation: 6018ftolerance (not to failure)    Time 4    Period Weeks    Status New    Target Date 01/05/21      PT SHORT TERM GOAL #2   Title Pt to demonstrate [place] to show improvement in balance.    Baseline Eval: 15/28    Time 4    Period Weeks    Status New    Target Date 01/05/21      PT SHORT TERM GOAL #3   Title Pt to demonstrate 10MWT<13sec to show self-selected gait speed appropriate for AMB outside of house and to improve energy efficiency.    Baseline UpWalker Eval: 17.53sec    Time 4    Period Weeks    Status New    Target Date 01/05/21               PT Long Term Goals - 12/08/20 1654       PT LONG TERM GOAL #1   Title Pt to score MiniBesTest > 20/28 to show improved balance.    Baseline Eval: 15/28    Time 8    Period Weeks    Status New    Target Date 02/02/21      PT LONG TERM GOAL #2   Title Pt to tolerate 100046fustained AMB with UpWalker without rest interval to improve safety and independence in limited community AMB.    Baseline At eval; tired after 600f24f Time 8    Period Weeks    Status New    Target  Date 02/02/21      PT LONG TERM GOAL #3   Title Pt to demonstrate improved FOTO score by 5 points compared to initital assessment.    Time 8    Period Weeks    Status New    Target Date 03/02/21      PT LONG TERM GOAL #4   Title Pt to show tolerance of AMB>1300ft28fh UpWalker to improve tolerance to accessing the community for IADL.    Baseline Eval: 600ft 70fime 12    Period Weeks    Status New    Target Date 03/02/21  Patient will benefit from skilled therapeutic intervention in order to improve the following deficits and impairments:  Abnormal gait, Decreased balance, Decreased coordination, Decreased mobility, Postural dysfunction, Decreased strength, Decreased safety awareness, Improper body mechanics, Impaired flexibility, Decreased activity tolerance, Decreased endurance, Decreased knowledge of precautions, Difficulty walking, Pain, Cardiopulmonary status limiting activity, Decreased range of motion, Impaired perceived functional ability, Decreased cognition, Dizziness  Visit Diagnosis: Unsteadiness on feet  Other abnormalities of gait and mobility  Muscle weakness (generalized)  Other lack of coordination     Problem List Patient Active Problem List   Diagnosis Date Noted   Sinus drainage 12/09/2020   Medicare annual wellness visit, subsequent 11/18/2020   Pacemaker    Dementia associated with Parkinson's disease ([place]) 06/17/2020   Vertigo 02/19/2020   Urine abnormality 02/19/2020   Dysphagia 02/23/2019   Fall at home 10/18/2017   REM behavioral disorder 11/02/2016   Radicular pain in right arm 10/09/2016   GERD (gastroesophageal reflux disease) 07/31/2016   Colon cancer screening 07/23/2015   Gout 02/18/2015   Depression 01/06/2015   Skin lesion 10/02/2014   Dupuytren's contracture 08/20/2014   Cough 07/21/2014   Lumbar stenosis with neurogenic claudication 06/13/2014   S/P deep brain stimulator placement 05/08/2014   Advance care  planning 01/19/2014   Parkinson's disease ([place]) 12/13/2013   PTSD (post-traumatic stress disorder) 06/13/2013   Erectile dysfunction 06/13/2013   HLD (hyperlipidemia) 06/13/2013   Essential hypertension 06/03/2013   Bradycardia by electrocardiogram 06/03/2013   Obstructive sleep apnea 03/12/2013   Ricard Dillon PT, DPT 12/14/2020, 5:17 PM  [place] MAIN [facility] SERVICES [address] [place], [place], [address] Phone: [phone]   Fax:  [phone]  Name: STEVEY DEMARY MRN: [id number] Date of Birth: [birth year]

## 2020-12-16 ENCOUNTER — Ambulatory Visit: Payer: Medicare PPO

## 2020-12-16 ENCOUNTER — Ambulatory Visit: Payer: Medicare PPO | Admitting: Speech Pathology

## 2020-12-17 NOTE — Progress Notes (Signed)
Remote pacemaker transmission.   

## 2020-12-23 ENCOUNTER — Ambulatory Visit: Payer: Medicare PPO | Admitting: Speech Pathology

## 2020-12-23 ENCOUNTER — Other Ambulatory Visit: Payer: Self-pay

## 2020-12-23 ENCOUNTER — Ambulatory Visit: Payer: Medicare PPO | Attending: Family Medicine

## 2020-12-23 DIAGNOSIS — R278 Other lack of coordination: Secondary | ICD-10-CM | POA: Insufficient documentation

## 2020-12-23 DIAGNOSIS — F0280 Dementia in other diseases classified elsewhere without behavioral disturbance: Secondary | ICD-10-CM | POA: Diagnosis not present

## 2020-12-23 DIAGNOSIS — G20 Parkinson's disease: Secondary | ICD-10-CM | POA: Diagnosis not present

## 2020-12-23 DIAGNOSIS — R296 Repeated falls: Secondary | ICD-10-CM | POA: Insufficient documentation

## 2020-12-23 DIAGNOSIS — R41841 Cognitive communication deficit: Secondary | ICD-10-CM

## 2020-12-23 DIAGNOSIS — R471 Dysarthria and anarthria: Secondary | ICD-10-CM | POA: Diagnosis present

## 2020-12-23 DIAGNOSIS — M6281 Muscle weakness (generalized): Secondary | ICD-10-CM | POA: Insufficient documentation

## 2020-12-23 DIAGNOSIS — R2681 Unsteadiness on feet: Secondary | ICD-10-CM | POA: Diagnosis not present

## 2020-12-23 DIAGNOSIS — R262 Difficulty in walking, not elsewhere classified: Secondary | ICD-10-CM | POA: Insufficient documentation

## 2020-12-23 DIAGNOSIS — R2689 Other abnormalities of gait and mobility: Secondary | ICD-10-CM | POA: Insufficient documentation

## 2020-12-23 NOTE — Therapy (Signed)
[place] MAIN [facility] SERVICES [address] [place], [place], [address] Phone: [phone]   Fax:  [phone]  Physical Therapy Treatment  Patient Details  Name: [name] MRN: [id number] Date of Birth: 03/30/1951 Referring Provider (PT): Elsie Stain, MD   Encounter Date: 12/23/2020   PT End of Session - 12/23/20 0935     Visit Number 3    Number of Visits 24    Date for PT Re-Evaluation 01/05/21    Authorization Type [employer] Medicare Choice PPO    Authorization Time Period 12/08/20-03/02/21    Progress Note Due on Visit 10    PT Start Time 0847    PT Stop Time 0930    PT Time Calculation (min) 43 min    Equipment Utilized During Treatment Gait belt    Activity Tolerance Patient tolerated treatment well;No increased pain;Patient limited by fatigue    Behavior During Therapy [facility] for tasks assessed/performed             Past Medical History:  Diagnosis Date   Arthritis    Bradycardia    Cancer ([place]) 2013   skin cancer   Depression    ptsd   Dysrhythmia    chronic slow heart rate   GERD (gastroesophageal reflux disease)    Headache(784.0)    tension headaches non recent   History of chicken pox    History of kidney stones    passed   Hypertension    treated with HCTZ   Pacemaker    Parkinson's disease ([place])    dx'ed 15 years ago   PTSD (post-traumatic stress disorder)    Shortness of breath dyspnea    Sleep apnea    doesn't use C-pap   Varicose veins     Past Surgical History:  Procedure Laterality Date   CHOLECYSTECTOMY N/A 10/22/2014   Procedure: LAPAROSCOPIC CHOLECYSTECTOMY WITH INTRAOPERATIVE CHOLANGIOGRAM;  Surgeon: Dia Crawford III, MD;  Location: [facility] ORS;  Service: General;  Laterality: N/A;   cyst removed      from lip as a child   INTRAMEDULLARY (IM) NAIL INTERTROCHANTERIC N/A 02/18/2019   Procedure: INTRAMEDULLARY (IM) NAIL INTERTROCHANTRIC, RIGHT,;  Surgeon: Thornton Park, MD;  Location: [facility] ORS;   Service: Orthopedics;  Laterality: N/A;   LUMBAR LAMINECTOMY/DECOMPRESSION MICRODISCECTOMY Bilateral 12/14/2012   Procedure: Bilateral lumbar three-four, four-five decompressive laminotomy/foraminotomy;  Surgeon: Charlie Pitter, MD;  Location: [place] NEURO ORS;  Service: Neurosurgery;  Laterality: Bilateral;   PULSE GENERATOR IMPLANT Bilateral 12/13/2013   Procedure: Bilateral implantable pulse generator placement;  Surgeon: Erline Levine, MD;  Location: [place] NEURO ORS;  Service: Neurosurgery;  Laterality: Bilateral;  Bilateral implantable pulse generator placement   skin cancer removed     from ears,   12 lft arm  rt leg 15   SUBTHALAMIC STIMULATOR BATTERY REPLACEMENT Bilateral 07/14/2017   Procedure: BILATERAL IMPLANTED PULSE GENERATOR CHANGE FOR DEEP BRAIN STIMULATOR;  Surgeon: Erline Levine, MD;  Location: [place];  Service: Neurosurgery;  Laterality: Bilateral;   SUBTHALAMIC STIMULATOR INSERTION Bilateral 12/06/2013   Procedure: SUBTHALAMIC STIMULATOR INSERTION;  Surgeon: Erline Levine, MD;  Location: [place] NEURO ORS;  Service: Neurosurgery;  Laterality: Bilateral;  Bilateral deep brain stimulator placement    There were no vitals filed for this visit.   Subjective Assessment - 12/23/20 0847     Subjective Patient reports 4 out of 10 back pain.  Patient reports 1-2 falls over the past weekend and past week.  1 fall occurred when patient fell out  of a chair.  Patient presents with scabs on trunk and reports scab on back d/t fall.  Patient is unsure of what caused the other fall.    Patient is accompained by: Family member    Pertinent History [name] is a 83yoM who comes to [facility] for evaluation of conitnued difficulty of imbalance, unsteadiness on feet, and generally limited tolerance to mobility in the setting of chronic parkinsonism. SInce DC in February, pt has had PPM placement due to bradycardia issues. Pt also started using an UpWalker device 2 week prior to this evaluation, goal was to help  with gradual melting of postural extension that occured with sustained upright actiivty. Pt has continued to practice supervised AMB with caregivers, short distances out of house and in community. Pt continues to experience frequent falls on a weekly basis, at a rate that both he and wife report to be increasing in frequency. PMH: HTN, bradycardia s/p PPM, PD, DBS, COVID-19 infection, chronic low back pain s/p L4/5 surgery.    Limitations Walking;Standing;Sitting;House hold activities;Lifting    How long can you sit comfortably? Not limited    How long can you stand comfortably? 3-5 minutes with support    How long can you walk comfortably? ~5-6 minutes UpWalker Walker    Patient Stated Goals Reduce falls, improve AMB tolerance    Currently in Pain? Yes    Pain Score 4     Pain Location Back    Pain Orientation Lower    Pain Onset More than a month ago    Pain Onset More than a month ago           . INTERVENTIONS-  NuStep, seat at 7 warm up for 2 minutes at level 0, increased to level 2, patient rates medium difficulty. Cuing for SPM >60s. Min a for mount/dismount.   Gait training: To promote endurance and gait mechanics.  Cueing for step length, upright posture,and dual task. [id number] ft.   STS from standard height chair, emphasis on large amplitude movement 2-x10 with forward reach/hand flicks. VC/TV/demo provided, frequent cues for upright posture. Pt rates medium.  In // bars:  Stepping forward/backward over half-foam. Heavy BUE support.  Cueing for increased hip flexion active range of motion and step length, and upright posture.  Multiple reps each lower extremity.  Progressed patient to wearing 2.5 pound ankle weights on bilateral lower extremities for 10 reps each lower extremity.  Progressed patient to stepping over orange hurdle with 2.5 pound ankle weights on each lower, continued use of cues for increased hip flexion and step length.  10 times each lower extremity  Seated  trunk rotation with reach and upper extremity supination and finger abduction -10 times each direction.  Patient requires upper extremity support on chair.  Recommend use of cues for larger amplitude movement.   PT provides cuing throughout session in the form of VC/TC/demo to facilitate movement at target joints and correct muscle activation with exercises. Pt shows improvement following cues within session.   Note: Portions of this document were prepared using Dragon voice recognition software and although reviewed may contain unintentional dictation errors in syntax, grammar, or spelling.   PT Education - 12/23/20 0934     Education provided Yes    Education Details Continued large amplitude movement training with transfers and gait    Person(s) Educated Patient    Methods Explanation;Demonstration;Tactile cues;Verbal cues    Comprehension Verbalized understanding;Returned demonstration;Tactile cues required;Verbal cues required;Need further instruction  PT Short Term Goals - 12/08/20 1649       PT SHORT TERM GOAL #1   Title Pt to demonstrate improved sustained AMB tolerance >844f.    Baseline At evlauation: 6010ftolerance (not to failure)    Time 4    Period Weeks    Status New    Target Date 01/05/21      PT SHORT TERM GOAL #2   Title Pt to demonstrate Mi[place]17/28 to show improvement in balance.    Baseline Eval: 15/28    Time 4    Period Weeks    Status New    Target Date 01/05/21      PT SHORT TERM GOAL #3   Title Pt to demonstrate 10MWT<13sec to show self-selected gait speed appropriate for AMB outside of house and to improve energy efficiency.    Baseline UpWalker Eval: 17.53sec    Time 4    Period Weeks    Status New    Target Date 01/05/21               PT Long Term Goals - 12/08/20 1654       PT LONG TERM GOAL #1   Title Pt to score MiniBesTest > 20/28 to show improved balance.    Baseline Eval: 15/28    Time 8     Period Weeks    Status New    Target Date 02/02/21      PT LONG TERM GOAL #2   Title Pt to tolerate 100068fustained AMB with UpWalker without rest interval to improve safety and independence in limited community AMB.    Baseline At eval; tired after 600f48f Time 8    Period Weeks    Status New    Target Date 02/02/21      PT LONG TERM GOAL #3   Title Pt to demonstrate improved FOTO score by 5 points compared to initital assessment.    Time 8    Period Weeks    Status New    Target Date 03/02/21      PT LONG TERM GOAL #4   Title Pt to show tolerance of AMB>1300ft75fh UpWalker to improve tolerance to accessing the community for IADL.    Baseline Eval: 600ft 56fime 12    Period Weeks    Status New    Target Date 03/02/21                   Plan - 12/23/20 0935     Clinical Impression Statement PT provided further instruction and large amplitude movement training for transfers and gait this session.  Patient was able to intermittently correct for larger step length after cueing, however, he was very challenged with addition of dual task and saw decrease step length. Trunk rotation exercise added this session as patient has difficulty with this movement but was able to improve hypokinetic movement following large amplitude cues.  Patient will benefit from further skilled physical therapy to continue to improve strength balance and gait to decrease fall risk.    Personal Factors and Comorbidities Age;Past/Current Experience;Time since onset of injury/illness/exacerbation;Transportation    Comorbidities Chronic back pain, orthostatic hypotension, repeated falls, parkinsonism    Examination-Activity Limitations Bathing;Bed Mobility;Caring for Others;Bend;Dressing;Lift;Locomotion Level;Reach Overhead;Sit;Squat;Stairs;Stand;Toileting;Transfers;Carry    Examination-Participation Restrictions Cleaning;Community Activity;Interpersonal Relationship;Shop;Volunteer;Yard  Work;Other;Driving    Stability/Clinical Decision Making Evolving/Moderate complexity    Rehab Potential Fair    PT Frequency 2x / week  PT Duration 12 weeks    PT Treatment/Interventions ADLs/Self Care Home Management;Aquatic Therapy;Cryotherapy;Electrical Stimulation;Iontophoresis '4mg'$ /ml Dexamethasone;Moist Heat;Ultrasound;Contrast Bath;DME Instruction;Gait training;Stair training;Functional mobility training;Therapeutic activities;Therapeutic exercise;Balance training;Neuromuscular re-education;Patient/family education;Manual techniques;Wheelchair mobility training;Energy conservation;Passive range of motion;Traction;Orthotic Fit/Training;Dry needling;Vestibular;Visual/perceptual remediation/compensation;Taping;Canalith Repostioning;Cognitive remediation    PT Next Visit Plan Advanced AMB tolerance, use MiniBesTest to guide balance interventions seated rotation and balance exercises    PT Home Exercise Plan None set up at evaluation, still working on previous HEP from 6 months prior (STS, daily walking); 8/29: issued STS with focus on large amplitude movement at support surface    Consulted and Agree with Plan of Care Patient;Family member/caregiver    Family Member Consulted Wife             Patient will benefit from skilled therapeutic intervention in order to improve the following deficits and impairments:  Abnormal gait, Decreased balance, Decreased coordination, Decreased mobility, Postural dysfunction, Decreased strength, Decreased safety awareness, Improper body mechanics, Impaired flexibility, Decreased activity tolerance, Decreased endurance, Decreased knowledge of precautions, Difficulty walking, Pain, Cardiopulmonary status limiting activity, Decreased range of motion, Impaired perceived functional ability, Decreased cognition, Dizziness  Visit Diagnosis: Other abnormalities of gait and mobility  Unsteadiness on feet  Muscle weakness (generalized)  Other lack of  coordination  Parkinson's disease ([place])     Problem List Patient Active Problem List   Diagnosis Date Noted   Sinus drainage 12/09/2020   Medicare annual wellness visit, subsequent 11/18/2020   Pacemaker    Dementia associated with Parkinson's disease ([place]) 06/17/2020   Vertigo 02/19/2020   Urine abnormality 02/19/2020   Dysphagia 02/23/2019   Fall at home 10/18/2017   REM behavioral disorder 11/02/2016   Radicular pain in right arm 10/09/2016   GERD (gastroesophageal reflux disease) 07/31/2016   Colon cancer screening 07/23/2015   Gout 02/18/2015   Depression 01/06/2015   Skin lesion 10/02/2014   Dupuytren's contracture 08/20/2014   Cough 07/21/2014   Lumbar stenosis with neurogenic claudication 06/13/2014   S/P deep brain stimulator placement 05/08/2014   Advance care planning 01/19/2014   Parkinson's disease ([place]) 12/13/2013   PTSD (post-traumatic stress disorder) 06/13/2013   Erectile dysfunction 06/13/2013   HLD (hyperlipidemia) 06/13/2013   Essential hypertension 06/03/2013   Bradycardia by electrocardiogram 06/03/2013   Obstructive sleep apnea 03/12/2013   Ricard Dillon PT, DPT  12/23/2020, 9:39 AM  [place] MAIN [facility] SERVICES [address] [place], [place], [address] Phone: [phone]   Fax:  [phone]  Name: [name] MRN: [id number] Date of Birth: [birth year]

## 2020-12-23 NOTE — Patient Instructions (Signed)
Using sound pressure meter on wife's cell phone, produce good quality loud /a/ 3 sets of 10 x 3 peer day Play game of WAR focusing on timely responses

## 2020-12-23 NOTE — Therapy (Signed)
[place] MAIN [facility] SERVICES [address] [place], [place], [address] Phone: [phone]   Fax:  [phone]  Speech Language Pathology Treatment  Patient Details  Name: [name] MRN: [id number] Date of Birth: 1950-08-14 Referring Provider (SLP): Elsie Stain   Encounter Date: 12/23/2020   End of Session - 12/23/20 1540     Visit Number 2    Number of Visits 17    Date for SLP Re-Evaluation 03/04/21    Authorization Type [employer] Medicare Choice PPO    Authorization Time Period 12/08/2020 thru 03/04/2021    Authorization - Visit Number 2    Progress Note Due on Visit 10    SLP Start Time 0930    SLP Stop Time  1000    SLP Time Calculation (min) 30 min    Activity Tolerance Patient tolerated treatment well             Past Medical History:  Diagnosis Date   Arthritis    Bradycardia    Cancer ([place]) 2013   skin cancer   Depression    ptsd   Dysrhythmia    chronic slow heart rate   GERD (gastroesophageal reflux disease)    Headache(784.0)    tension headaches non recent   History of chicken pox    History of kidney stones    passed   Hypertension    treated with HCTZ   Pacemaker    Parkinson's disease ([place])    dx'ed 15 years ago   PTSD (post-traumatic stress disorder)    Shortness of breath dyspnea    Sleep apnea    doesn't use C-pap   Varicose veins     Past Surgical History:  Procedure Laterality Date   CHOLECYSTECTOMY N/A 10/22/2014   Procedure: LAPAROSCOPIC CHOLECYSTECTOMY WITH INTRAOPERATIVE CHOLANGIOGRAM;  Surgeon: Dia Crawford III, MD;  Location: [facility] ORS;  Service: General;  Laterality: N/A;   cyst removed      from lip as a child   INTRAMEDULLARY (IM) NAIL INTERTROCHANTERIC N/A 02/18/2019   Procedure: INTRAMEDULLARY (IM) NAIL INTERTROCHANTRIC, RIGHT,;  Surgeon: Thornton Park, MD;  Location: [facility] ORS;  Service: Orthopedics;  Laterality: N/A;   LUMBAR LAMINECTOMY/DECOMPRESSION MICRODISCECTOMY Bilateral  12/14/2012   Procedure: Bilateral lumbar three-four, four-five decompressive laminotomy/foraminotomy;  Surgeon: Charlie Pitter, MD;  Location: [place] NEURO ORS;  Service: Neurosurgery;  Laterality: Bilateral;   PULSE GENERATOR IMPLANT Bilateral 12/13/2013   Procedure: Bilateral implantable pulse generator placement;  Surgeon: Erline Levine, MD;  Location: [place] NEURO ORS;  Service: Neurosurgery;  Laterality: Bilateral;  Bilateral implantable pulse generator placement   skin cancer removed     from ears,   12 lft arm  rt leg 15   SUBTHALAMIC STIMULATOR BATTERY REPLACEMENT Bilateral 07/14/2017   Procedure: BILATERAL IMPLANTED PULSE GENERATOR CHANGE FOR DEEP BRAIN STIMULATOR;  Surgeon: Erline Levine, MD;  Location: [place];  Service: Neurosurgery;  Laterality: Bilateral;   SUBTHALAMIC STIMULATOR INSERTION Bilateral 12/06/2013   Procedure: SUBTHALAMIC STIMULATOR INSERTION;  Surgeon: Erline Levine, MD;  Location: [place] NEURO ORS;  Service: Neurosurgery;  Laterality: Bilateral;  Bilateral deep brain stimulator placement    There were no vitals filed for this visit.   Subjective Assessment - 12/23/20 1534     Subjective pt pleasant, accompanied by his wife    Patient is accompained by: Family member    Currently in Pain? No/denies                   ADULT SLP  TREATMENT - 12/23/20 0001       Treatment Provided   Treatment provided Cognitive-Linquistic      Cognitive-Linquistic Treatment   Treatment focused on Cognition;Dysarthria;Patient/family/caregiver education    Skilled Treatment SLP facilitated session by engaging pt in basic card game of WAR to target selective attention to task of dealing and playing game as well as target timely responses. Pt was independent with timely responses during game but required moderate assistance for timely responses during verbal interactions. Pt frequently stares into space and needs redirection to speaker for response. Audiofeedback was used to create awareness of  pt's reduced vocal intensity (65dB) and overall reduced speech intelligibility (,25%). With maximal cues, pt able to produce a loud /a/ at 83dB, 131 Hz for 8.7 seconds.              SLP Education - 12/23/20 1540     Education Details vocal intensity, timely responses    Person(s) Educated Patient;Spouse    Methods Explanation;Demonstration;Verbal cues    Comprehension Verbalized understanding;Need further instruction              SLP Short Term Goals - 12/10/20 0801       SLP SHORT TERM GOAL #1   Title Pt will maintain speech volume appropriate for 80% intelligibilty in 3 minutes simple conversation with rare min A.    Baseline < 75%    Time 10    Period --   sessions   Status New      SLP SHORT TERM GOAL #2   Title Pt will use 1 external compensatory memory aid to recall orientation information with minimal assistance.    Baseline not currently using compensatory memory aids    Time 10    Period --   sessions   Status New      SLP SHORT TERM GOAL #3   Title Pt will sustain attention to basic task for 5 minutes with moderate assistance.    Baseline < 2 minutes of sustained attention    Time 10    Period --   sessions   Status New      SLP SHORT TERM GOAL #4   Title --              SLP Long Term Goals - 12/10/20 [id number]       SLP LONG TERM GOAL #1   Title Pt will use speech intelligibility strategies to achieve ~ 80% speech intelligiblity at the sentence level with intermittent supervision cues.    Baseline <75% speech intelligibility    Time 8    Period Weeks    Status New    Target Date 03/04/21      SLP LONG TERM GOAL #2   Title Patient will identify cognitive-communication barriers and participate in developing functional compensatory strategies.    Baseline new goal    Time 8    Period Weeks    Status New    Target Date 03/04/21      SLP LONG TERM GOAL #3   Title Pt will complete cognitive communication HEP.    Baseline new goal    Time 8     Period Weeks    Status New    Target Date 03/04/21              Plan - 12/23/20 1541     Clinical Impression Statement Pt presents with significantly increased processing times, decreased attention to task as well as severe speech intelligiblity deficits. As  a result, pt requires maximal assistance to increase speech intelligibility and communicate basics wants/needs. At this time, recommend skilled ST intervention to target these deficits, provide education to pt and his wife, introduce compensatory strategies to increase pt's functional independence and reduce caregiver burden.    Speech Therapy Frequency 2x / week    Duration 8 weeks    Treatment/Interventions Compensatory techniques;Internal/external aids;Functional tasks;SLP instruction and feedback;Patient/family education    Potential to Achieve Goals Fair    Potential Considerations Ability to learn/carryover information;Medical prognosis;Severity of impairments;Co-morbidities;Previous level of function    SLP Home Exercise Plan provided, see pt instructions section    Consulted and Agree with Plan of Care Patient;Family member/caregiver    Family Member Consulted [name]             Patient will benefit from skilled therapeutic intervention in order to improve the following deficits and impairments:   Cognitive communication deficit  Dysarthria and anarthria  Dementia associated with Parkinson's disease ([place])  Parkinson's disease ([place])    Problem List Patient Active Problem List   Diagnosis Date Noted   Sinus drainage 12/09/2020   Medicare annual wellness visit, subsequent 11/18/2020   Pacemaker    Dementia associated with Parkinson's disease ([place]) 06/17/2020   Vertigo 02/19/2020   Urine abnormality 02/19/2020   Dysphagia 02/23/2019   Fall at home 10/18/2017   REM behavioral disorder 11/02/2016   Radicular pain in right arm 10/09/2016   GERD (gastroesophageal reflux disease) 07/31/2016   Colon cancer  screening 07/23/2015   Gout 02/18/2015   Depression 01/06/2015   Skin lesion 10/02/2014   Dupuytren's contracture 08/20/2014   Cough 07/21/2014   Lumbar stenosis with neurogenic claudication 06/13/2014   S/P deep brain stimulator placement 05/08/2014   Advance care planning 01/19/2014   Parkinson's disease ([place]) 12/13/2013   PTSD (post-traumatic stress disorder) 06/13/2013   Erectile dysfunction 06/13/2013   HLD (hyperlipidemia) 06/13/2013   Essential hypertension 06/03/2013   Bradycardia by electrocardiogram 06/03/2013   Obstructive sleep apnea 03/12/2013   Carisha Kantor B. Rutherford Nail M.S., CCC-SLP, [place] Pathologist Rehabilitation Services Office [phone]  Stormy Fabian 12/23/2020, 3:45 PM  [place] MAIN [facility] SERVICES [address] [place], [place], [address] Phone: [phone]   Fax:  [phone]   Name: [name] MRN: [id number] Date of Birth: 1950-10-10

## 2020-12-28 ENCOUNTER — Ambulatory Visit: Payer: Medicare PPO

## 2020-12-28 ENCOUNTER — Ambulatory Visit: Payer: Medicare PPO | Admitting: Speech Pathology

## 2020-12-28 ENCOUNTER — Other Ambulatory Visit: Payer: Self-pay

## 2020-12-28 DIAGNOSIS — R41841 Cognitive communication deficit: Secondary | ICD-10-CM | POA: Diagnosis not present

## 2020-12-28 DIAGNOSIS — R262 Difficulty in walking, not elsewhere classified: Secondary | ICD-10-CM | POA: Diagnosis not present

## 2020-12-28 DIAGNOSIS — M6281 Muscle weakness (generalized): Secondary | ICD-10-CM

## 2020-12-28 DIAGNOSIS — G20 Parkinson's disease: Secondary | ICD-10-CM | POA: Diagnosis not present

## 2020-12-28 DIAGNOSIS — R278 Other lack of coordination: Secondary | ICD-10-CM | POA: Diagnosis not present

## 2020-12-28 DIAGNOSIS — R2681 Unsteadiness on feet: Secondary | ICD-10-CM | POA: Diagnosis not present

## 2020-12-28 DIAGNOSIS — F0280 Dementia in other diseases classified elsewhere without behavioral disturbance: Secondary | ICD-10-CM | POA: Diagnosis not present

## 2020-12-28 DIAGNOSIS — R296 Repeated falls: Secondary | ICD-10-CM | POA: Diagnosis not present

## 2020-12-28 DIAGNOSIS — R2689 Other abnormalities of gait and mobility: Secondary | ICD-10-CM

## 2020-12-28 NOTE — Therapy (Signed)
[place] MAIN [facility] SERVICES [address] [place], [place], [address] Phone: [phone]   Fax:  [phone]  Physical Therapy Treatment  Patient Details  Name: [name] MRN: [id number] Date of Birth: 06/10/1950 Referring Provider ([name]): Elsie Stain, MD   Encounter Date: 12/28/2020   [name] End of Session - 12/28/20 1023     Visit Number 4    Number of Visits 24    Date for [name] Re-Evaluation 01/05/21    Authorization Type [employer] Medicare Choice PPO    Authorization Time Period 12/08/20-03/02/21    Progress Note Due on Visit 10    [name] Start Time 0934    [name] Stop Time 1017    [name] Time Calculation (min) 43 min    Equipment Utilized During Treatment Gait belt    Activity Tolerance Patient tolerated treatment well;No increased pain    Behavior During Therapy WFL for tasks assessed/performed             Past Medical History:  Diagnosis Date   Arthritis    Bradycardia    Cancer ([place]) 2013   skin cancer   Depression    ptsd   Dysrhythmia    chronic slow heart rate   GERD (gastroesophageal reflux disease)    Headache(784.0)    tension headaches non recent   History of chicken pox    History of kidney stones    passed   Hypertension    treated with HCTZ   Pacemaker    Parkinson's disease ([place])    dx'ed 15 years ago   PTSD (post-traumatic stress disorder)    Shortness of breath dyspnea    Sleep apnea    doesn't use C-pap   Varicose veins     Past Surgical History:  Procedure Laterality Date   CHOLECYSTECTOMY N/A 10/22/2014   Procedure: LAPAROSCOPIC CHOLECYSTECTOMY WITH INTRAOPERATIVE CHOLANGIOGRAM;  Surgeon: Dia Crawford III, MD;  Location: [facility] ORS;  Service: General;  Laterality: N/A;   cyst removed      from lip as a child   INTRAMEDULLARY (IM) NAIL INTERTROCHANTERIC N/A 02/18/2019   Procedure: INTRAMEDULLARY (IM) NAIL INTERTROCHANTRIC, RIGHT,;  Surgeon: Thornton Park, MD;  Location: [facility] ORS;  Service: Orthopedics;   Laterality: N/A;   LUMBAR LAMINECTOMY/DECOMPRESSION MICRODISCECTOMY Bilateral 12/14/2012   Procedure: Bilateral lumbar three-four, four-five decompressive laminotomy/foraminotomy;  Surgeon: Charlie Pitter, MD;  Location: [place] NEURO ORS;  Service: Neurosurgery;  Laterality: Bilateral;   PULSE GENERATOR IMPLANT Bilateral 12/13/2013   Procedure: Bilateral implantable pulse generator placement;  Surgeon: Erline Levine, MD;  Location: [place] NEURO ORS;  Service: Neurosurgery;  Laterality: Bilateral;  Bilateral implantable pulse generator placement   skin cancer removed     from ears,   12 lft arm  rt leg 15   SUBTHALAMIC STIMULATOR BATTERY REPLACEMENT Bilateral 07/14/2017   Procedure: BILATERAL IMPLANTED PULSE GENERATOR CHANGE FOR DEEP BRAIN STIMULATOR;  Surgeon: Erline Levine, MD;  Location: [place];  Service: Neurosurgery;  Laterality: Bilateral;   SUBTHALAMIC STIMULATOR INSERTION Bilateral 12/06/2013   Procedure: SUBTHALAMIC STIMULATOR INSERTION;  Surgeon: Erline Levine, MD;  Location: [place] NEURO ORS;  Service: Neurosurgery;  Laterality: Bilateral;  Bilateral deep brain stimulator placement    There were no vitals filed for this visit.   Subjective Assessment - 12/28/20 0937     [name] 4/10 back pain. Reports no falls since last appointment.    Patient is accompained by: Family member    Pertinent History [name] is a 62yoM who  comes to [facility] for evaluation of conitnued difficulty of imbalance, unsteadiness on feet, and generally limited tolerance to mobility in the setting of chronic parkinsonism. SInce DC in February, [name] has had PPM placement due to bradycardia issues. [name] also started using an UpWalker device 2 week prior to this evaluation, goal was to help with gradual melting of postural extension that occured with sustained upright actiivty. [name] has continued to practice supervised AMB with caregivers, short distances out of house and in community. [name] continues to experience  frequent falls on a weekly basis, at a rate that both he and wife report to be increasing in frequency. PMH: HTN, bradycardia s/p PPM, PD, DBS, COVID-19 infection, chronic low back pain s/p L4/5 surgery.    Limitations Walking;Standing;Sitting;House hold activities;Lifting    How long can you sit comfortably? Not limited    How long can you stand comfortably? 3-5 minutes with support    How long can you walk comfortably? ~5-6 minutes UpWalker Walker    Patient Stated Goals Reduce falls, improve AMB tolerance    Currently in Pain? Yes    Pain Score 4     Pain Location Back    Pain Orientation Lower    Pain Onset More than a month ago    Pain Onset More than a month ago                   . INTERVENTIONS-  Neuro Re-education: emphasis on motor control, amplitude of movement for the following   STS from standard height chair, emphasis on large amplitude movement 4x5 with forward reach/hand flicks.  Continued frequent cues for upright posture in the form of demonstration and tactile cueing.  Patient also requires frequent cueing to use hand flicks.  Instruction on safe stand to sit technique with locking brakes prior to sitting in chair.  Patient demonstrated safe technique for remainder of session following cues.  Seated large amplitude twist with UE reach across - 10x each direction 10 sec holds, [name] provides hands-on assist for proper technique as well as demonstration and verbal cues.  Instructed patient to hold onto seat with other hand for safety.  Thoracic extension over chair 10x frequent cues for technique, to promote upright posture and kinesthetic awareness of posture.  Standing at rolling walker going from mini squat to upright posture 12 times.  Frequent verbal cues provided for reinforcement with correct technique. Modeling provided.   Sit to stands 1 x 6 at end of session to reinforce large amplitude movement and upright posture.  Gait training with obstacle course to  promote scanning and navigating obstacles, as well as gait mechanics.  Continued cueing for step length, upright posture. 4x148 ft. patient has difficulty maintaining gait speed when navigating around cones and needs instruction for navigating through cones.  Patient struggles to maintain upright posture this session.  Frequent demonstration and verbal cues for step length particularly with turning.    HEP: [name] instructed patient in seated large amplitude trunk twist with upper extremity reach to be performed in each direction 10 times.  Instructed patient to perform this exercise with caregiver or spouse right next to him for safety.  Patient and spouse verbalized understanding.  [name] also reinforces sit to stand large amplitude technique with finger flicks.  Also instructed this patient to perform this exercise 10 times.  Instructed patient to have caregiver or spouse next to him for safety when performing this exercise.  Each exercise to be performed 4 times  a week.   [name] provides cuing throughout session in the form of VC/TC/demo to facilitate movement at target joints and correct muscle activation with exercises. [name] shows improvement following cues within session.   Note: Portions of this document were prepared using Dragon voice recognition software and although reviewed may contain unintentional dictation errors in syntax, grammar, or spelling.     [name] Education - 12/28/20 1022     Education provided Yes    Education Details Exercise technique, body mechanics, HEP (see note for details).    Person(s) Educated Patient;Spouse    Methods Explanation;Tactile cues;Demonstration;Verbal cues;Handout    Comprehension Returned demonstration;Verbal cues required;Verbalized understanding;Tactile cues required;Need further instruction              [name] Short Term Goals - 12/08/20 1649       [name] SHORT TERM GOAL #1   Title [name] to demonstrate improved sustained AMB tolerance >853f.    Baseline At  evlauation: 6070ftolerance (not to failure)    Time 4    Period Weeks    Status New    Target Date 01/05/21      [name] SHORT TERM GOAL #2   Title [name] to demonstrate [place]17/28 to show improvement in balance.    Baseline Eval: 15/28    Time 4    Period Weeks    Status New    Target Date 01/05/21      [name] SHORT TERM GOAL #3   Title [name] to demonstrate 10MWT<13sec to show self-selected gait speed appropriate for AMB outside of house and to improve energy efficiency.    Baseline UpWalker Eval: 17.53sec    Time 4    Period Weeks    Status New    Target Date 01/05/21               [name] Long Term Goals - 12/08/20 1654       [name] LONG TERM GOAL #1   Title [name] to score MiniBesTest > 20/28 to show improved balance.    Baseline Eval: 15/28    Time 8    Period Weeks    Status New    Target Date 02/02/21      [name] LONG TERM GOAL #2   Title [name] to tolerate 100036fustained AMB with UpWalker without rest interval to improve safety and independence in limited community AMB.    Baseline At eval; tired after 600f46f Time 8    Period Weeks    Status New    Target Date 02/02/21      [name] LONG TERM GOAL #3   Title [name] to demonstrate improved FOTO score by 5 points compared to initital assessment.    Time 8    Period Weeks    Status New    Target Date 03/02/21      [name] LONG TERM GOAL #4   Title [name] to show tolerance of AMB>1300ft24fh UpWalker to improve tolerance to accessing the community for IADL.    Baseline Eval: 600ft 30fime 12    Period Weeks    Status New    Target Date 03/02/21                   Plan - 12/28/20 1023     Clinical Impression Statement [name] issued HEP this session for patient to focus on seated trunk rotation and sit to stand technique at home.  [name] also reinforced safe technique with upright walker.  Patient was able to demonstrate safe technique with using walker brakes with transfers consistently by the end of the session.  Patient most  challenged with increasing step length due to hypokinetic and bradykinetic movement.  The patient will benefit from further skilled physical therapy to improve balance, gait mechanics, and lower extremity strength to decrease fall risk.    Personal Factors and Comorbidities Age;Past/Current Experience;Time since onset of injury/illness/exacerbation;Transportation    Comorbidities Chronic back pain, orthostatic hypotension, repeated falls, parkinsonism    Examination-Activity Limitations Bathing;Bed Mobility;Caring for Others;Bend;Dressing;Lift;Locomotion Level;Reach Overhead;Sit;Squat;Stairs;Stand;Toileting;Transfers;Carry    Examination-Participation Restrictions Cleaning;Community Activity;Interpersonal Relationship;Shop;Volunteer;Yard Work;Other;Driving    Stability/Clinical Decision Making Evolving/Moderate complexity    Rehab Potential Fair    [name] Frequency 2x / week    [name] Duration 12 weeks    [name] Treatment/Interventions ADLs/Self Care Home Management;Aquatic Therapy;Cryotherapy;Electrical Stimulation;Iontophoresis '4mg'$ /ml Dexamethasone;Moist Heat;Ultrasound;Contrast Bath;DME Instruction;Gait training;Stair training;Functional mobility training;Therapeutic activities;Therapeutic exercise;Balance training;Neuromuscular re-education;Patient/family education;Manual techniques;Wheelchair mobility training;Energy conservation;Passive range of motion;Traction;Orthotic Fit/Training;Dry needling;Vestibular;Visual/perceptual remediation/compensation;Taping;Canalith Repostioning;Cognitive remediation    [name] Next Visit Plan Advanced AMB tolerance, use MiniBesTest to guide balance interventions seated rotation and balance exercises    [name] Home Exercise Plan None set up at evaluation, still working on previous HEP from 6 months prior (STS, daily walking); 8/29: issued STS with focus on large amplitude movement at support surface; 9/12: [name] provides handout with sit to stand exercise and large amplitude seated trunk  rotation exercise.    Consulted and Agree with Plan of Care Patient;Family member/caregiver    Family Member Consulted Wife             Patient will benefit from skilled therapeutic intervention in order to improve the following deficits and impairments:  Abnormal gait, Decreased balance, Decreased coordination, Decreased mobility, Postural dysfunction, Decreased strength, Decreased safety awareness, Improper body mechanics, Impaired flexibility, Decreased activity tolerance, Decreased endurance, Decreased knowledge of precautions, Difficulty walking, Pain, Cardiopulmonary status limiting activity, Decreased range of motion, Impaired perceived functional ability, Decreased cognition, Dizziness  Visit Diagnosis: Other abnormalities of gait and mobility  Other lack of coordination  Unsteadiness on feet  Muscle weakness (generalized)  Parkinson's disease ([place])     Problem List Patient Active Problem List   Diagnosis Date Noted   Sinus drainage 12/09/2020   Medicare annual wellness visit, subsequent 11/18/2020   Pacemaker    Dementia associated with Parkinson's disease ([place]) 06/17/2020   Vertigo 02/19/2020   Urine abnormality 02/19/2020   Dysphagia 02/23/2019   Fall at home 10/18/2017   REM behavioral disorder 11/02/2016   Radicular pain in right arm 10/09/2016   GERD (gastroesophageal reflux disease) 07/31/2016   Colon cancer screening 07/23/2015   Gout 02/18/2015   Depression 01/06/2015   Skin lesion 10/02/2014   Dupuytren's contracture 08/20/2014   Cough 07/21/2014   Lumbar stenosis with neurogenic claudication 06/13/2014   S/P deep brain stimulator placement 05/08/2014   Advance care planning 01/19/2014   Parkinson's disease ([place]) 12/13/2013   PTSD (post-traumatic stress disorder) 06/13/2013   Erectile dysfunction 06/13/2013   HLD (hyperlipidemia) 06/13/2013   Essential hypertension 06/03/2013   Bradycardia by electrocardiogram 06/03/2013   Obstructive sleep  apnea 03/12/2013    [name], [name] 12/28/2020, 10:31 AM  [facility] [address] [place], [place], [address] Phone: [phone]   Fax:  [phone]  Name: [name] MRN: [id number] Date of Birth: [birth year]

## 2020-12-28 NOTE — Progress Notes (Signed)
Cardiology Office Note  Date:  12/29/2020   ID:  [name], DOB 1950/09/19, MRN [id number]  PCP:  Tonia Ghent, MD   Chief Complaint  Patient presents with   12 month follow up     "Doing well." Medications reviewed by the patient verbally.     HPI:  [name] is a pleasant 70 -year-old male year-old gentleman with  Parkinson's for more than 10 years,  Hypertension, chronic bradycardia , pacemaker placement of deep brain stimulator August 2015 for tremor followup at the [facility] hyperlipidemia, depression, possible seizure disorder Long-standing history of heart rate 40-50 bpm and in general has been asymptomatic who presents for routine follow-up of his blood pressure and bradycardia, hypertension.   Most of the history provided by patient's wife and chart review, he is alert though relatively nonverbal but will respond to questions  pacemaker implant at [facility] with Dr Roderic Palau and has done well since implant. "Color is better", good energy  walked around [place] using a shopping cart without significant limitation which is a big improvement for him.  06/2020: deep brain stimulator replaced, placed on the right Chronic back pain  New walker, more active  "BP dropping with standing" per patient's wife who presents with him today  Chronic fatigue  Lab work reviewed Total cho 140, LDL 76  EKG personally reviewed by myself on todays visit Shows paced rhythm  Other past medical history reviewed Ziopatch monitor showed episodes of supraventricular tachycardia superimposed on a baseline of bradycardia. Seen by EP 11/2019 Plan to repeat Zio in 6 months  CT scan of the head 05/29/2013 showing mild chronic inflammatory disease of the sinuses. This was done for right sided headache. Work done 05/29/2013 includes basic     PMH:   has a past medical history of Arthritis, Bradycardia, Cancer ([place]) (2013), Depression, Dysrhythmia, GERD (gastroesophageal reflux  disease), Headache(784.0), History of chicken pox, History of kidney stones, Hypertension, Pacemaker, Parkinson's disease ([place]), PTSD (post-traumatic stress disorder), Shortness of breath dyspnea, Sleep apnea, and Varicose veins.  PSH:    Past Surgical History:  Procedure Laterality Date   CHOLECYSTECTOMY N/A 10/22/2014   Procedure: LAPAROSCOPIC CHOLECYSTECTOMY WITH INTRAOPERATIVE CHOLANGIOGRAM;  Surgeon: Dia Crawford III, MD;  Location: [facility] ORS;  Service: General;  Laterality: N/A;   cyst removed      from lip as a child   INTRAMEDULLARY (IM) NAIL INTERTROCHANTERIC N/A 02/18/2019   Procedure: INTRAMEDULLARY (IM) NAIL INTERTROCHANTRIC, RIGHT,;  Surgeon: Thornton Park, MD;  Location: [facility] ORS;  Service: Orthopedics;  Laterality: N/A;   LUMBAR LAMINECTOMY/DECOMPRESSION MICRODISCECTOMY Bilateral 12/14/2012   Procedure: Bilateral lumbar three-four, four-five decompressive laminotomy/foraminotomy;  Surgeon: Charlie Pitter, MD;  Location: [place] NEURO ORS;  Service: Neurosurgery;  Laterality: Bilateral;   PULSE GENERATOR IMPLANT Bilateral 12/13/2013   Procedure: Bilateral implantable pulse generator placement;  Surgeon: Erline Levine, MD;  Location: [place] NEURO ORS;  Service: Neurosurgery;  Laterality: Bilateral;  Bilateral implantable pulse generator placement   skin cancer removed     from ears,   12 lft arm  rt leg 15   SUBTHALAMIC STIMULATOR BATTERY REPLACEMENT Bilateral 07/14/2017   Procedure: BILATERAL IMPLANTED PULSE GENERATOR CHANGE FOR DEEP BRAIN STIMULATOR;  Surgeon: Erline Levine, MD;  Location: [place];  Service: Neurosurgery;  Laterality: Bilateral;   SUBTHALAMIC STIMULATOR INSERTION Bilateral 12/06/2013   Procedure: SUBTHALAMIC STIMULATOR INSERTION;  Surgeon: Erline Levine, MD;  Location: [place] NEURO ORS;  Service: Neurosurgery;  Laterality: Bilateral;  Bilateral deep brain stimulator placement  Current Outpatient Medications  Medication Sig Dispense Refill   aspirin EC 81 MG tablet Take 1 tablet (81 mg  total) by mouth daily. Swallow whole. 90 tablet 3   carbidopa-levodopa (SINEMET IR) 25-100 MG tablet 2 tablets in AM, 1.5 tablets in afternoon, and 1.5 tablets in the evening     cholecalciferol (VITAMIN D) 1000 UNITS tablet Take 1,000 Units by mouth daily.     Cranberry 200 MG CAPS Take by mouth.     escitalopram (LEXAPRO) 10 MG tablet TAKE 1 TABLET DAILY 90 tablet 3   Melatonin 10 MG TABS Take 10 mg by mouth at bedtime.     memantine (NAMENDA) 10 MG tablet TAKE 1 TABLET TWICE A DAY 180 tablet 3   midodrine (PROAMATINE) 5 MG tablet Take 1 tablet (5 mg total) by mouth as needed. For orthostasis, drops in pressure <100 when standing 90 tablet 1   naproxen sodium (ALEVE) 220 MG tablet Take 220 mg by mouth.     omeprazole (PRILOSEC) 20 MG capsule TAKE 1 CAPSULE DAILY 90 capsule 3   rivastigmine (EXELON) 9.5 mg/24hr 9.5 mg daily.     UNABLE TO FIND Med Name: Trimix (30/1/10)-(Pap/Phent/PGE)  Test Dose  51m vial   Qty #3 [phone] [phone]    No current facility-administered medications for this visit.     Allergies:   Clonazepam, Morphine and related, Prednisone, and Tramadol   Social History:  The patient  reports that he has never smoked. He has quit using smokeless tobacco. He reports current alcohol use. He reports that he does not use drugs.   Family History:   family history includes Lung cancer in his father.    Review of Systems: Review of Systems  HENT: Negative.    Respiratory: Negative.    Cardiovascular: Negative.   Gastrointestinal: Negative.   Musculoskeletal:        Unsteady gait  Neurological:  Positive for weakness.  Psychiatric/Behavioral: Negative.    All other systems reviewed and are negative.  PHYSICAL EXAM: VS:  BP 122/80 (BP Location: Left Arm, Patient Position: Sitting, Cuff Size: Normal)   Pulse 79   Ht '5\' 9"'$  (1.753 m)   Wt 182 lb (82.6 kg)   SpO2 98%   BMI 26.88 kg/m  , BMI Body mass index is 26.88  kg/m. Constitutional:  oriented to person, place, and time. No distress.  HENT:  Head: Grossly normal Eyes:  no discharge. No scleral icterus.  Neck: No JVD, no carotid bruits  Cardiovascular: Regular rate and rhythm, no murmurs appreciated Pulmonary/Chest: Clear to auscultation bilaterally, no wheezes or rails Abdominal: Soft.  no distension.  no tenderness.  Musculoskeletal: Normal range of motion Neurological:  normal muscle tone. Coordination normal. No atrophy Skin: Skin warm and dry Psychiatric: normal affect, pleasant   Recent Labs: 11/06/2020: ALT 9; BUN 22; Creatinine, Ser 0.97; Hemoglobin 14.7; Platelets 190.0; Potassium 4.2; Sodium 142; TSH 2.08    Lipid Panel Lab Results  Component Value Date   CHOL 182 11/06/2020   HDL 37.10 (L) 11/06/2020   LDLCALC 109 (H) 11/06/2020   TRIG 182.0 (H) 11/06/2020    Wt Readings from Last 3 Encounters:  12/29/20 182 lb (82.6 kg)  12/10/20 182 lb (82.6 kg)  12/09/20 181 lb 8 oz (82.3 kg)     ASSESSMENT AND PLAN:  Mixed hyperlipidemia  Cholesterol is at goal on the current lipid regimen. No changes to the medications were made.  Essential hypertension -  Stable today, running low at home, orthostasis, Poor fluid intake  Spinal stenosis, lumbar region, with neurogenic claudication -  Chronic back pain Stimulator in place, on right  Bradycardia Followed by EP, pacer  may 2022 More energy per the wife  Orthostasis Wife has documented drops in rpessuire, difficult time working with PT secondary to blood pressure drops Midodrine 5 mg as needed for pressure <100 Recommend she continue to monitor orthostasis numbers and use midodrine as directed  Total encounter time more than 25 minutes Greater than 50% was spent in counseling and coordination of care with the patient     Orders Placed This Encounter  Procedures   EKG 12-Lead     Signed, Esmond Plants, M.D., Ph.D. 12/29/2020  [place],  [place]

## 2020-12-29 ENCOUNTER — Ambulatory Visit (INDEPENDENT_AMBULATORY_CARE_PROVIDER_SITE_OTHER): Payer: Medicare PPO | Admitting: Cardiovascular Disease

## 2020-12-29 ENCOUNTER — Encounter: Payer: Self-pay | Admitting: Cardiovascular Disease

## 2020-12-29 VITALS — BP 122/80 | HR 79 | Ht 69.0 in | Wt 182.0 lb

## 2020-12-29 DIAGNOSIS — I495 Sick sinus syndrome: Secondary | ICD-10-CM | POA: Diagnosis not present

## 2020-12-29 DIAGNOSIS — I951 Orthostatic hypotension: Secondary | ICD-10-CM | POA: Diagnosis not present

## 2020-12-29 DIAGNOSIS — G20 Parkinson's disease: Secondary | ICD-10-CM | POA: Diagnosis not present

## 2020-12-29 DIAGNOSIS — Z950 Presence of cardiac pacemaker: Secondary | ICD-10-CM

## 2020-12-29 DIAGNOSIS — I471 Supraventricular tachycardia: Secondary | ICD-10-CM

## 2020-12-29 MED ORDER — MIDODRINE HCL 5 MG PO TABS: 5.0000 mg | ORAL_TABLET | ORAL | 1 refills | Status: DC | PRN

## 2020-12-29 NOTE — Patient Instructions (Signed)
RMT exercises (EMST and IMST) Dictate sentences for additional photos Begin dictating/creating poetry about his cat "Patches"

## 2020-12-29 NOTE — Patient Instructions (Addendum)
Medication Instructions:  Midodrine 5 mg  as needed for orthostasis drops in pressure <100 when standing  If you need a refill on your cardiac medications before your next appointment, please call your pharmacy.   Lab work: No new labs needed  Testing/Procedures: No new testing needed  Follow-Up: At [facility], you and your health needs are our priority.  As part of our continuing mission to provide you with exceptional heart care, we have created designated Provider Care Teams.  These Care Teams include your primary Cardiologist (physician) and Advanced Practice Providers (APPs -  Physician Assistants and Nurse Practitioners) who all work together to provide you with the care you need, when you need it.  You will need a follow up appointment in 12 months  Providers on your designated Care Team:   Murray Hodgkins, NP Christell Faith, PA-C Marrianne Mood, PA-C Cadence Medina, [place]  COVID-19 Vaccine Information can be found at: [email or web address] For questions related to vaccine distribution or appointments, please email [email or web address] or call [phone].

## 2020-12-29 NOTE — Therapy (Signed)
[place] MAIN [facility] SERVICES [address] [place], [place], [address] Phone: [phone]   Fax:  [phone]  Speech Language Pathology Treatment  Patient Details  Name: [name] MRN: [id number] Date of Birth: October 01, 1950 Referring Provider (SLP): Elsie Stain   Encounter Date: 12/28/2020   End of Session - 12/29/20 1448     Visit Number 3    Number of Visits 17    Date for SLP Re-Evaluation 03/04/21    Authorization Type [employer] Choice PPO    Authorization Time Period 12/08/2020 thru 03/04/2021    Authorization - Visit Number 3    Progress Note Due on Visit 10    SLP Start Time 0845    SLP Stop Time  0930    SLP Time Calculation (min) 45 min    Activity Tolerance Patient tolerated treatment well             Past Medical History:  Diagnosis Date   Arthritis    Bradycardia    Cancer ([place]) 2013   skin cancer   Depression    ptsd   Dysrhythmia    chronic slow heart rate   GERD (gastroesophageal reflux disease)    Headache(784.0)    tension headaches non recent   History of chicken pox    History of kidney stones    passed   Hypertension    treated with HCTZ   Pacemaker    Parkinson's disease ([place])    dx'ed 15 years ago   PTSD (post-traumatic stress disorder)    Shortness of breath dyspnea    Sleep apnea    doesn't use C-pap   Varicose veins     Past Surgical History:  Procedure Laterality Date   CHOLECYSTECTOMY N/A 10/22/2014   Procedure: LAPAROSCOPIC CHOLECYSTECTOMY WITH INTRAOPERATIVE CHOLANGIOGRAM;  Surgeon: Dia Crawford III, MD;  Location: [facility] ORS;  Service: General;  Laterality: N/A;   cyst removed      from lip as a child   INTRAMEDULLARY (IM) NAIL INTERTROCHANTERIC N/A 02/18/2019   Procedure: INTRAMEDULLARY (IM) NAIL INTERTROCHANTRIC, RIGHT,;  Surgeon: Thornton Park, MD;  Location: [facility] ORS;  Service: Orthopedics;  Laterality: N/A;   LUMBAR LAMINECTOMY/DECOMPRESSION MICRODISCECTOMY Bilateral  12/14/2012   Procedure: Bilateral lumbar three-four, four-five decompressive laminotomy/foraminotomy;  Surgeon: Charlie Pitter, MD;  Location: [place] NEURO ORS;  Service: Neurosurgery;  Laterality: Bilateral;   PULSE GENERATOR IMPLANT Bilateral 12/13/2013   Procedure: Bilateral implantable pulse generator placement;  Surgeon: Erline Levine, MD;  Location: [place] NEURO ORS;  Service: Neurosurgery;  Laterality: Bilateral;  Bilateral implantable pulse generator placement   skin cancer removed     from ears,   12 lft arm  rt leg 15   SUBTHALAMIC STIMULATOR BATTERY REPLACEMENT Bilateral 07/14/2017   Procedure: BILATERAL IMPLANTED PULSE GENERATOR CHANGE FOR DEEP BRAIN STIMULATOR;  Surgeon: Erline Levine, MD;  Location: [place];  Service: Neurosurgery;  Laterality: Bilateral;   SUBTHALAMIC STIMULATOR INSERTION Bilateral 12/06/2013   Procedure: SUBTHALAMIC STIMULATOR INSERTION;  Surgeon: Erline Levine, MD;  Location: [place] NEURO ORS;  Service: Neurosurgery;  Laterality: Bilateral;  Bilateral deep brain stimulator placement    There were no vitals filed for this visit.   Subjective Assessment - 12/29/20 1446     Subjective pt pleasant, accompanied by his wife    Patient is accompained by: Family member    Currently in Pain? No/denies                   ADULT SLP  TREATMENT - 12/29/20 0001       Treatment Provided   Treatment provided Cognitive-Linquistic      Cognitive-Linquistic Treatment   Treatment focused on Cognition;Dysarthria;Patient/family/caregiver education    Skilled Treatment COGNITIVE COMMUNICATION: pt brought in his EMST and IMST devices - EMST 150 device set at [facility] and IMST set at [facility] to complete 3 sets of 10 with self-perceived effort of 8 out of 10; pt also brought in photos of his family - maximal assistance and more than a reasonable amount of time required to generate basic sentence about 6 pictures d/t deficits in recall of family members' names (his daughter's name) as well as  delayed processing; pt able to read written sentence with increased speech intelligibility d/t moderate cues to increase vocal intensity              SLP Education - 12/29/20 1448     Education Details cognitive communication activities    Person(s) Educated Patient;Spouse    Methods Explanation;Demonstration;Verbal cues;Handout    Comprehension Verbalized understanding;Need further instruction              SLP Short Term Goals - 12/10/20 0801       SLP SHORT TERM GOAL #1   Title Pt will maintain speech volume appropriate for 80% intelligibilty in 3 minutes simple conversation with rare min A.    Baseline < 75%    Time 10    Period --   sessions   Status New      SLP SHORT TERM GOAL #2   Title Pt will use 1 external compensatory memory aid to recall orientation information with minimal assistance.    Baseline not currently using compensatory memory aids    Time 10    Period --   sessions   Status New      SLP SHORT TERM GOAL #3   Title Pt will sustain attention to basic task for 5 minutes with moderate assistance.    Baseline < 2 minutes of sustained attention    Time 10    Period --   sessions   Status New      SLP SHORT TERM GOAL #4   Title --              SLP Long Term Goals - 12/10/20 [id number]       SLP LONG TERM GOAL #1   Title Pt will use speech intelligibility strategies to achieve ~ 80% speech intelligiblity at the sentence level with intermittent supervision cues.    Baseline <75% speech intelligibility    Time 8    Period Weeks    Status New    Target Date 03/04/21      SLP LONG TERM GOAL #2   Title Patient will identify cognitive-communication barriers and participate in developing functional compensatory strategies.    Baseline new goal    Time 8    Period Weeks    Status New    Target Date 03/04/21      SLP LONG TERM GOAL #3   Title Pt will complete cognitive communication HEP.    Baseline new goal    Time 8    Period Weeks     Status New    Target Date 03/04/21              Plan - 12/29/20 1449     Clinical Impression Statement Pt presents with significantly increased processing times, decreased attention to task as well as severe speech intelligiblity deficits.  As a result, pt requires maximal assistance to increase speech intelligibility and communicate basics wants/needs. At this time, recommend skilled ST intervention to target these deficits, provide education to pt and his wife, introduce compensatory strategies to increase pt's functional independence and reduce caregiver burden.    Speech Therapy Frequency 2x / week    Duration 8 weeks    Treatment/Interventions Compensatory techniques;Internal/external aids;Functional tasks;SLP instruction and feedback;Patient/family education    Potential to Achieve Goals Fair    Potential Considerations Ability to learn/carryover information;Medical prognosis;Severity of impairments;Co-morbidities;Previous level of function    SLP Home Exercise Plan provided, see pt instructions section    Consulted and Agree with Plan of Care Patient;Family member/caregiver    Family Member Consulted [name]             Patient will benefit from skilled therapeutic intervention in order to improve the following deficits and impairments:   Cognitive communication deficit  Parkinson's disease ([place])  Dementia associated with Parkinson's disease ([place])    Problem List Patient Active Problem List   Diagnosis Date Noted   Sinus drainage 12/09/2020   Medicare annual wellness visit, subsequent 11/18/2020   Pacemaker    Dementia associated with Parkinson's disease ([place]) 06/17/2020   Vertigo 02/19/2020   Urine abnormality 02/19/2020   Dysphagia 02/23/2019   Fall at home 10/18/2017   REM behavioral disorder 11/02/2016   Radicular pain in right arm 10/09/2016   GERD (gastroesophageal reflux disease) 07/31/2016   Colon cancer screening 07/23/2015   Gout 02/18/2015    Depression 01/06/2015   Skin lesion 10/02/2014   Dupuytren's contracture 08/20/2014   Cough 07/21/2014   Lumbar stenosis with neurogenic claudication 06/13/2014   S/P deep brain stimulator placement 05/08/2014   Advance care planning 01/19/2014   Parkinson's disease ([place]) 12/13/2013   PTSD (post-traumatic stress disorder) 06/13/2013   Erectile dysfunction 06/13/2013   HLD (hyperlipidemia) 06/13/2013   Essential hypertension 06/03/2013   Bradycardia by electrocardiogram 06/03/2013   Obstructive sleep apnea 03/12/2013   Tangi Shroff B. Rutherford Nail M.S., CCC-SLP, [place] [facility] [phone]  Stormy Fabian 12/29/2020, 2:50 PM  [place] MAIN [facility] SERVICES [address] [place], [place], [address] Phone: [phone]   Fax:  [phone]   Name: [name] MRN: [id number] Date of Birth: 1951-03-12

## 2020-12-30 ENCOUNTER — Ambulatory Visit: Payer: Medicare PPO

## 2020-12-30 ENCOUNTER — Ambulatory Visit: Payer: Medicare PPO | Admitting: Speech Pathology

## 2020-12-30 ENCOUNTER — Other Ambulatory Visit: Payer: Self-pay

## 2020-12-30 DIAGNOSIS — R296 Repeated falls: Secondary | ICD-10-CM | POA: Diagnosis not present

## 2020-12-30 DIAGNOSIS — R41841 Cognitive communication deficit: Secondary | ICD-10-CM

## 2020-12-30 DIAGNOSIS — F0280 Dementia in other diseases classified elsewhere without behavioral disturbance: Secondary | ICD-10-CM

## 2020-12-30 DIAGNOSIS — R278 Other lack of coordination: Secondary | ICD-10-CM

## 2020-12-30 DIAGNOSIS — M6281 Muscle weakness (generalized): Secondary | ICD-10-CM | POA: Diagnosis not present

## 2020-12-30 DIAGNOSIS — R471 Dysarthria and anarthria: Secondary | ICD-10-CM

## 2020-12-30 DIAGNOSIS — G20 Parkinson's disease: Secondary | ICD-10-CM

## 2020-12-30 DIAGNOSIS — R2681 Unsteadiness on feet: Secondary | ICD-10-CM | POA: Diagnosis not present

## 2020-12-30 DIAGNOSIS — R262 Difficulty in walking, not elsewhere classified: Secondary | ICD-10-CM | POA: Diagnosis not present

## 2020-12-30 DIAGNOSIS — R2689 Other abnormalities of gait and mobility: Secondary | ICD-10-CM | POA: Diagnosis not present

## 2020-12-30 NOTE — Therapy (Signed)
[place] MAIN [facility] SERVICES [address] [place], [place], [address] Phone: [phone]   Fax:  [phone]  Physical Therapy Treatment  Patient Details  Name: [name] MRN: [id number] Date of Birth: 1950-12-30 Referring Provider (PT): Elsie Stain, MD   Encounter Date: 12/30/2020   PT End of Session - 12/30/20 1045     Visit Number 5    Number of Visits 24    Date for PT Re-Evaluation 01/05/21    Authorization Type [employer] Medicare Choice PPO    Authorization Time Period 12/08/20-03/02/21    Progress Note Due on Visit 10    PT Start Time 0847    PT Stop Time 0928    PT Time Calculation (min) 41 min    Equipment Utilized During Treatment Gait belt    Activity Tolerance Patient tolerated treatment well;No increased pain    Behavior During Therapy WFL for tasks assessed/performed             Past Medical History:  Diagnosis Date   Arthritis    Bradycardia    Cancer ([place]) 2013   skin cancer   Depression    ptsd   Dysrhythmia    chronic slow heart rate   GERD (gastroesophageal reflux disease)    Headache(784.0)    tension headaches non recent   History of chicken pox    History of kidney stones    passed   Hypertension    treated with HCTZ   Pacemaker    Parkinson's disease ([place])    dx'ed 15 years ago   PTSD (post-traumatic stress disorder)    Shortness of breath dyspnea    Sleep apnea    doesn't use C-pap   Varicose veins     Past Surgical History:  Procedure Laterality Date   CHOLECYSTECTOMY N/A 10/22/2014   Procedure: LAPAROSCOPIC CHOLECYSTECTOMY WITH INTRAOPERATIVE CHOLANGIOGRAM;  Surgeon: Dia Crawford III, MD;  Location: [facility] ORS;  Service: General;  Laterality: N/A;   cyst removed      from lip as a child   INTRAMEDULLARY (IM) NAIL INTERTROCHANTERIC N/A 02/18/2019   Procedure: INTRAMEDULLARY (IM) NAIL INTERTROCHANTRIC, RIGHT,;  Surgeon: Thornton Park, MD;  Location: [facility] ORS;  Service: Orthopedics;   Laterality: N/A;   LUMBAR LAMINECTOMY/DECOMPRESSION MICRODISCECTOMY Bilateral 12/14/2012   Procedure: Bilateral lumbar three-four, four-five decompressive laminotomy/foraminotomy;  Surgeon: Charlie Pitter, MD;  Location: [place] NEURO ORS;  Service: Neurosurgery;  Laterality: Bilateral;   PULSE GENERATOR IMPLANT Bilateral 12/13/2013   Procedure: Bilateral implantable pulse generator placement;  Surgeon: Erline Levine, MD;  Location: [place] NEURO ORS;  Service: Neurosurgery;  Laterality: Bilateral;  Bilateral implantable pulse generator placement   skin cancer removed     from ears,   12 lft arm  rt leg 15   SUBTHALAMIC STIMULATOR BATTERY REPLACEMENT Bilateral 07/14/2017   Procedure: BILATERAL IMPLANTED PULSE GENERATOR CHANGE FOR DEEP BRAIN STIMULATOR;  Surgeon: Erline Levine, MD;  Location: [place];  Service: Neurosurgery;  Laterality: Bilateral;   SUBTHALAMIC STIMULATOR INSERTION Bilateral 12/06/2013   Procedure: SUBTHALAMIC STIMULATOR INSERTION;  Surgeon: Erline Levine, MD;  Location: [place] NEURO ORS;  Service: Neurosurgery;  Laterality: Bilateral;  Bilateral deep brain stimulator placement    There were no vitals filed for this visit.   Subjective Assessment - 12/30/20 0848     Subjective Patient reports back pain.  Patient reports no falls.    Patient is accompained by: Family member    Pertinent History [name] is a 8yoM who comes to  [facility] for evaluation of conitnued difficulty of imbalance, unsteadiness on feet, and generally limited tolerance to mobility in the setting of chronic parkinsonism. SInce DC in February, pt has had PPM placement due to bradycardia issues. Pt also started using an UpWalker device 2 week prior to this evaluation, goal was to help with gradual melting of postural extension that occured with sustained upright actiivty. Pt has continued to practice supervised AMB with caregivers, short distances out of house and in community. Pt continues to experience frequent falls on a  weekly basis, at a rate that both he and wife report to be increasing in frequency. PMH: HTN, bradycardia s/p PPM, PD, DBS, COVID-19 infection, chronic low back pain s/p L4/5 surgery.    Limitations Walking;Standing;Sitting;House hold activities;Lifting    How long can you sit comfortably? Not limited    How long can you stand comfortably? 3-5 minutes with support    How long can you walk comfortably? ~5-6 minutes UpWalker Walker    Patient Stated Goals Reduce falls, improve AMB tolerance    Currently in Pain? Yes    Pain Location Back    Pain Onset More than a month ago    Pain Onset More than a month ago            INTERVENTIONS-   Neuro Re-education: Continued emphasis on motor control, amplitude of movement   Seated:  Lean forward and bend followed by big sit up and reach to sides 10x; cueing for increased finger abduction and shoulder abduction bilaterally -progressed to 1.5#weights on wrists-10x; pt reports feels it through his back  The following exercises were performed with 1.5 pound weights on patient's wrists to increased level of resistance/intensity:  Seated large amplitude twist with UE reach across - 8x each direction; continued instruction to use 1 upper extremity to support self on chair for safety.  Patient has difficulty with coordination, requiring frequent hands-on assist for technique.  Seated lean with big side bend/reach to contralateral side - 8x each direction; continued use of multimodal cueing  Seated trunk twist and clap - 8x each side, requires frequent cues  STS from standard height chair, continued emphasis on large amplitude movement, wearing 1.5 pound weights on wrists - 3x5 with forward reach/hand flicks.  PT cues patient for upright posture, although patient requires it less frequently.  Stand at walker, 1 and holding onto walker for safety, patient performs big sidestep with unilateral big reach- 8x each side   Gait training- Gait training  with obstacle course to promote scanning and navigating obstacles, as well as gait mechanics.  PT provides continued cueing for step length, upright posture. 3x148 ft. patient shows improvement with maintaining upright posture and sustaining large steps.  Patient able to navigate obstacle course without knocking into cones.  Patient requires around 60% cueing for technique.  Note: Portions of this document were prepared using Dragon voice recognition software and although reviewed may contain unintentional dictation errors in syntax, grammar, or spelling.     PT Education - 12/30/20 1044     Education provided Yes    Education Details Exercise technique, body mechanics, particular focus on cueing for trunk rotation exercises    Person(s) Educated Patient    Methods Explanation;Demonstration;Tactile cues;Verbal cues    Comprehension Verbalized understanding;Returned demonstration;Verbal cues required;Tactile cues required;Need further instruction              PT Short Term Goals - 12/08/20 1649       PT SHORT  TERM GOAL #1   Title Pt to demonstrate improved sustained AMB tolerance >842f.    Baseline At evlauation: 6064ftolerance (not to failure)    Time 4    Period Weeks    Status New    Target Date 01/05/21      PT SHORT TERM GOAL #2   Title Pt to demonstrate [place]17/28 to show improvement in balance.    Baseline Eval: 15/28    Time 4    Period Weeks    Status New    Target Date 01/05/21      PT SHORT TERM GOAL #3   Title Pt to demonstrate 10MWT<13sec to show self-selected gait speed appropriate for AMB outside of house and to improve energy efficiency.    Baseline UpWalker Eval: 17.53sec    Time 4    Period Weeks    Status New    Target Date 01/05/21               PT Long Term Goals - 12/08/20 1654       PT LONG TERM GOAL #1   Title Pt to score MiniBesTest > 20/28 to show improved balance.    Baseline Eval: 15/28    Time 8    Period Weeks     Status New    Target Date 02/02/21      PT LONG TERM GOAL #2   Title Pt to tolerate 100026fustained AMB with UpWalker without rest interval to improve safety and independence in limited community AMB.    Baseline At eval; tired after 600f61f Time 8    Period Weeks    Status New    Target Date 02/02/21      PT LONG TERM GOAL #3   Title Pt to demonstrate improved FOTO score by 5 points compared to initital assessment.    Time 8    Period Weeks    Status New    Target Date 03/02/21      PT LONG TERM GOAL #4   Title Pt to show tolerance of AMB>1300ft68fh UpWalker to improve tolerance to accessing the community for IADL.    Baseline Eval: 600ft 68fime 12    Period Weeks    Status New    Target Date 03/02/21                   Plan - 12/30/20 1052     Clinical Impression Statement Patient shows improvement with gait mechanics on this date.  Patient is better able to sustain large steps and upright posture, requiring 60% cueing with gait training.  Patient still most challenged with trunk rotation, particularly to the right side.  More interventions to promote rotation were added this session.  The patient will benefit from further skilled PT to improve balance, gait mechanics and to override hypokinetic and bradykinetic movement.    Personal Factors and Comorbidities Age;Past/Current Experience;Time since onset of injury/illness/exacerbation;Transportation    Comorbidities Chronic back pain, orthostatic hypotension, repeated falls, parkinsonism    Examination-Activity Limitations Bathing;Bed Mobility;Caring for Others;Bend;Dressing;Lift;Locomotion Level;Reach Overhead;Sit;Squat;Stairs;Stand;Toileting;Transfers;Carry    Examination-Participation Restrictions Cleaning;Community Activity;Interpersonal Relationship;Shop;Volunteer;Yard Work;Other;Driving    Stability/Clinical Decision Making Evolving/Moderate complexity    Rehab Potential Fair    PT Frequency 2x / week    PT  Duration 12 weeks    PT Treatment/Interventions ADLs/Self Care Home Management;Aquatic Therapy;Cryotherapy;Electrical Stimulation;Iontophoresis '4mg'$ /ml Dexamethasone;Moist Heat;Ultrasound;Contrast Bath;DME Instruction;Gait training;Stair training;Functional mobility training;Therapeutic activities;Therapeutic exercise;Balance training;Neuromuscular re-education;Patient/family education;Manual techniques;Wheelchair mobility training;Energy  conservation;Passive range of motion;Traction;Orthotic Fit/Training;Dry needling;Vestibular;Visual/perceptual remediation/compensation;Taping;Canalith Repostioning;Cognitive remediation    PT Next Visit Plan Advanced AMB tolerance, use MiniBesTest to guide balance interventions seated rotation and balance exercises    PT Home Exercise Plan None set up at evaluation, still working on previous HEP from 6 months prior (STS, daily walking); 8/29: issued STS with focus on large amplitude movement at support surface; 9/12: PT provides handout with sit to stand exercise and large amplitude seated trunk rotation exercise.  No updates on this date.    Consulted and Agree with Plan of Care Patient;Family member/caregiver    Family Member Consulted Wife             Patient will benefit from skilled therapeutic intervention in order to improve the following deficits and impairments:  Abnormal gait, Decreased balance, Decreased coordination, Decreased mobility, Postural dysfunction, Decreased strength, Decreased safety awareness, Improper body mechanics, Impaired flexibility, Decreased activity tolerance, Decreased endurance, Decreased knowledge of precautions, Difficulty walking, Pain, Cardiopulmonary status limiting activity, Decreased range of motion, Impaired perceived functional ability, Decreased cognition, Dizziness  Visit Diagnosis: Other abnormalities of gait and mobility  Other lack of coordination  Unsteadiness on feet  Parkinson's disease ([place])     Problem  List Patient Active Problem List   Diagnosis Date Noted   Sinus drainage 12/09/2020   Medicare annual wellness visit, subsequent 11/18/2020   Pacemaker    Dementia associated with Parkinson's disease ([place]) 06/17/2020   Vertigo 02/19/2020   Urine abnormality 02/19/2020   Dysphagia 02/23/2019   Fall at home 10/18/2017   REM behavioral disorder 11/02/2016   Radicular pain in right arm 10/09/2016   GERD (gastroesophageal reflux disease) 07/31/2016   Colon cancer screening 07/23/2015   Gout 02/18/2015   Depression 01/06/2015   Skin lesion 10/02/2014   Dupuytren's contracture 08/20/2014   Cough 07/21/2014   Lumbar stenosis with neurogenic claudication 06/13/2014   S/P deep brain stimulator placement 05/08/2014   Advance care planning 01/19/2014   Parkinson's disease ([place]) 12/13/2013   PTSD (post-traumatic stress disorder) 06/13/2013   Erectile dysfunction 06/13/2013   HLD (hyperlipidemia) 06/13/2013   Essential hypertension 06/03/2013   Bradycardia by electrocardiogram 06/03/2013   Obstructive sleep apnea 03/12/2013    Zollie Pee, PT 12/30/2020, 10:55 AM  [place] MAIN [facility] SERVICES [address] [place], [place], [address] Phone: [phone]   Fax:  [phone]  Name: ALCIDE MARCELO MRN: [id number] Date of Birth: December 25, 1950

## 2020-12-30 NOTE — Therapy (Signed)
[place] MAIN [facility] SERVICES [address] [place], [place], [address] Phone: [phone]   Fax:  [phone]  Speech Language Pathology Treatment  Patient Details  Name: [name] MRN: [id number] Date of Birth: 07/31/1950 Referring Provider (SLP): Elsie Stain   Encounter Date: 12/30/2020   End of Session - 12/30/20 1247     Visit Number 4    Number of Visits 17    Date for SLP Re-Evaluation 03/04/21    Authorization Type [employer] Medicare Choice PPO    Authorization Time Period 12/08/2020 thru 03/04/2021    Authorization - Visit Number 4    Progress Note Due on Visit 10    SLP Start Time 0800    SLP Stop Time  0845    SLP Time Calculation (min) 45 min    Activity Tolerance Patient tolerated treatment well             Past Medical History:  Diagnosis Date   Arthritis    Bradycardia    Cancer ([place]) 2013   skin cancer   Depression    ptsd   Dysrhythmia    chronic slow heart rate   GERD (gastroesophageal reflux disease)    Headache(784.0)    tension headaches non recent   History of chicken pox    History of kidney stones    passed   Hypertension    treated with HCTZ   Pacemaker    Parkinson's disease ([place])    dx'ed 15 years ago   PTSD (post-traumatic stress disorder)    Shortness of breath dyspnea    Sleep apnea    doesn't use C-pap   Varicose veins     Past Surgical History:  Procedure Laterality Date   CHOLECYSTECTOMY N/A 10/22/2014   Procedure: LAPAROSCOPIC CHOLECYSTECTOMY WITH INTRAOPERATIVE CHOLANGIOGRAM;  Surgeon: Dia Crawford III, MD;  Location: [facility] ORS;  Service: General;  Laterality: N/A;   cyst removed      from lip as a child   INTRAMEDULLARY (IM) NAIL INTERTROCHANTERIC N/A 02/18/2019   Procedure: INTRAMEDULLARY (IM) NAIL INTERTROCHANTRIC, RIGHT,;  Surgeon: [place] Park, MD;  Location: [facility] ORS;  Service: Orthopedics;  Laterality: N/A;   LUMBAR LAMINECTOMY/DECOMPRESSION MICRODISCECTOMY Bilateral  12/14/2012   Procedure: Bilateral lumbar three-four, four-five decompressive laminotomy/foraminotomy;  Surgeon: Charlie Pitter, MD;  Location: [place] [facility] ORS;  Service: Neurosurgery;  Laterality: Bilateral;   PULSE GENERATOR IMPLANT Bilateral 12/13/2013   Procedure: Bilateral implantable pulse generator placement;  Surgeon: Erline Levine, MD;  Location: [place] [facility]S;  Service: Neurosurgery;  Laterality: Bilateral;  Bilateral implantable pulse generator placement   skin cancer removed     from ears,   12 lft arm  rt leg 15   SUBTHALAMIC STIMULATOR BATTERY REPLACEMENT Bilateral 07/14/2017   Procedure: BILATERAL IMPLANTED PULSE GENERATOR CHANGE FOR DEEP BRAIN STIMULATOR;  Surgeon: Erline Levine, MD;  Location: [place];  Service: Neurosurgery;  Laterality: Bilateral;   SUBTHALAMIC STIMULATOR INSERTION Bilateral 12/06/2013   Procedure: SUBTHALAMIC STIMULATOR INSERTION;  Surgeon: Erline Levine, MD;  Location: [place] [facility] ORS;  Service: Neurosurgery;  Laterality: Bilateral;  Bilateral deep brain stimulator placement    There were no vitals filed for this visit.   Subjective Assessment - 12/30/20 1238     Subjective pt presented with greatly decreased response times, very engaged, conversation    Patient is accompained by: Family member    Currently in Pain? No/denies  ADULT SLP TREATMENT - 12/30/20 0001       Treatment Provided   Treatment provided Cognitive-Linquistic      Cognitive-Linquistic Treatment   Treatment focused on Cognition;Dysarthria;Patient/family/caregiver education    Skilled Treatment COGNITIVE COMMUNICATION: EMST 150 device set at [place] and IMST increased to 30 cmH2O to complete 3 sets of 10 with self-perceived effort of 8 out of 10; pt had generated > 15 descriptive sentences about family pictures and he started a poem about his belowed cat, all phrases/sentences were appropriate to picture and were complex; SPEECH INTELLIGIBILITY: great prosody and loudness  when reading printed sentences about his pictures as well as simple conversation about pictures - continued fast rate decreases intelligibility to 90%              SLP Education - 12/30/20 1246     Education Details cognitive communication processes involved within activities such as looking at pictures    Person(s) Educated Patient;Spouse    Methods Explanation;Demonstration;Verbal cues    Comprehension Verbalized understanding;Returned demonstration;Need further instruction              SLP Short Term Goals - 12/10/20 0801       SLP SHORT TERM GOAL #1   Title Pt will maintain speech volume appropriate for 80% intelligibilty in 3 minutes simple conversation with rare min A.    Baseline < 75%    Time 10    Period --   sessions   Status New      SLP SHORT TERM GOAL #2   Title Pt will use 1 external compensatory memory aid to recall orientation information with minimal assistance.    Baseline not currently using compensatory memory aids    Time 10    Period --   sessions   Status New      SLP SHORT TERM GOAL #3   Title Pt will sustain attention to basic task for 5 minutes with moderate assistance.    Baseline < 2 minutes of sustained attention    Time 10    Period --   sessions   Status New      SLP SHORT TERM GOAL #4   Title --              SLP Long Term Goals - 12/10/20 [id number]       SLP LONG TERM GOAL #1   Title Pt will use speech intelligibility strategies to achieve ~ 80% speech intelligiblity at the sentence level with intermittent supervision cues.    Baseline <75% speech intelligibility    Time 8    Period Weeks    Status New    Target Date 03/04/21      SLP LONG TERM GOAL #2   Title Patient will identify cognitive-communication barriers and participate in developing functional compensatory strategies.    Baseline new goal    Time 8    Period Weeks    Status New    Target Date 03/04/21      SLP LONG TERM GOAL #3   Title Pt will complete  cognitive communication HEP.    Baseline new goal    Time 8    Period Weeks    Status New    Target Date 03/04/21              Plan - 12/30/20 1247     Clinical Impression Statement Pt with increased recall of family members' names during today's session as a result of creating sentence about pictures.  While pt states that he is "feeling more lucid," he continues to demonstrate severe deficits in memory, processing times with moderate deficits in attention. As such, he is reliant on caregivers and his wife for all tasks. Skilled ST intervention is required to target these deficits to increase pt's functional independence and reduce caregiver burden.    Speech Therapy Frequency 2x / week    Duration 12 weeks    Treatment/Interventions Compensatory techniques;Internal/external aids;Functional tasks;SLP instruction and feedback;Patient/family education    Potential to Achieve Goals Fair    Potential Considerations Ability to learn/carryover information;Medical prognosis;Severity of impairments;Co-morbidities;Previous level of function    SLP Home Exercise Plan provided, see pt instructions section    Consulted and Agree with Plan of Care Patient;Family member/caregiver    Family Member Consulted [name]             Patient will benefit from skilled therapeutic intervention in order to improve the following deficits and impairments:   Cognitive communication deficit  Dysarthria and anarthria  Parkinson's disease ([place])  Dementia associated with Parkinson's disease ([place])    Problem List Patient Active Problem List   Diagnosis Date Noted   Sinus drainage 12/09/2020   Medicare annual wellness visit, subsequent 11/18/2020   Pacemaker    Dementia associated with Parkinson's disease ([place]) 06/17/2020   Vertigo 02/19/2020   Urine abnormality 02/19/2020   Dysphagia 02/23/2019   Fall at home 10/18/2017   REM behavioral disorder 11/02/2016   Radicular pain in right arm  10/09/2016   GERD (gastroesophageal reflux disease) 07/31/2016   Colon cancer screening 07/23/2015   Gout 02/18/2015   Depression 01/06/2015   Skin lesion 10/02/2014   Dupuytren's contracture 08/20/2014   Cough 07/21/2014   Lumbar stenosis with neurogenic claudication 06/13/2014   S/P deep brain stimulator placement 05/08/2014   Advance care planning 01/19/2014   Parkinson's disease ([place]) 12/13/2013   PTSD (post-traumatic stress disorder) 06/13/2013   Erectile dysfunction 06/13/2013   HLD (hyperlipidemia) 06/13/2013   Essential hypertension 06/03/2013   Bradycardia by electrocardiogram 06/03/2013   Obstructive sleep apnea 03/12/2013   Login Muckleroy B. Rutherford Nail M.S., CCC-SLP, [place] Pathologist Rehabilitation Services Office [phone]  Stormy Fabian 12/30/2020, 12:51 PM  [place] MAIN [facility] SERVICES [address] [place], [place], [address] Phone: [phone]   Fax:  [phone]   Name: [name] MRN: [id number] Date of Birth: Nov 26, 1950

## 2020-12-30 NOTE — Patient Instructions (Signed)
Continue creating poem about his cat Dictate sentences about more photos

## 2021-01-01 DIAGNOSIS — Z9689 Presence of other specified functional implants: Secondary | ICD-10-CM | POA: Diagnosis not present

## 2021-01-01 DIAGNOSIS — G20 Parkinson's disease: Secondary | ICD-10-CM | POA: Diagnosis not present

## 2021-01-04 ENCOUNTER — Other Ambulatory Visit: Payer: Self-pay

## 2021-01-04 ENCOUNTER — Ambulatory Visit (INDEPENDENT_AMBULATORY_CARE_PROVIDER_SITE_OTHER): Payer: Medicare PPO | Admitting: Psychology

## 2021-01-04 ENCOUNTER — Ambulatory Visit: Payer: Medicare PPO | Admitting: Speech Pathology

## 2021-01-04 ENCOUNTER — Ambulatory Visit: Payer: Medicare PPO

## 2021-01-04 DIAGNOSIS — R41841 Cognitive communication deficit: Secondary | ICD-10-CM | POA: Diagnosis not present

## 2021-01-04 DIAGNOSIS — R278 Other lack of coordination: Secondary | ICD-10-CM | POA: Diagnosis not present

## 2021-01-04 DIAGNOSIS — R471 Dysarthria and anarthria: Secondary | ICD-10-CM

## 2021-01-04 DIAGNOSIS — R262 Difficulty in walking, not elsewhere classified: Secondary | ICD-10-CM | POA: Diagnosis not present

## 2021-01-04 DIAGNOSIS — F331 Major depressive disorder, recurrent, moderate: Secondary | ICD-10-CM | POA: Diagnosis not present

## 2021-01-04 DIAGNOSIS — G20 Parkinson's disease: Secondary | ICD-10-CM

## 2021-01-04 DIAGNOSIS — F0280 Dementia in other diseases classified elsewhere without behavioral disturbance: Secondary | ICD-10-CM

## 2021-01-04 DIAGNOSIS — R2681 Unsteadiness on feet: Secondary | ICD-10-CM | POA: Diagnosis not present

## 2021-01-04 DIAGNOSIS — M6281 Muscle weakness (generalized): Secondary | ICD-10-CM | POA: Diagnosis not present

## 2021-01-04 DIAGNOSIS — R296 Repeated falls: Secondary | ICD-10-CM | POA: Diagnosis not present

## 2021-01-04 DIAGNOSIS — R2689 Other abnormalities of gait and mobility: Secondary | ICD-10-CM | POA: Diagnosis not present

## 2021-01-04 NOTE — Therapy (Signed)
[facility] MAIN [facility] SERVICES [address] [place], [place], [address] Phone: [phone]   Fax:  [phone]  Physical Therapy Treatment  Patient Details  Name: [name] MRN: [id number] Date of Birth: 24-Jan-1951 Referring Provider (PT): Elsie Stain, MD   Encounter Date: 01/04/2021   PT End of Session - 01/04/21 1030     Visit Number 6    Number of Visits 24    Date for PT Re-Evaluation 01/05/21    Authorization Type [employer] Medicare Choice PPO    Authorization Time Period 12/08/20-03/02/21    Progress Note Due on Visit 10    PT Start Time 0932    PT Stop Time 1016    PT Time Calculation (min) 44 min    Equipment Utilized During Treatment Gait belt    Activity Tolerance Patient tolerated treatment well;No increased pain    Behavior During Therapy WFL for tasks assessed/performed             Past Medical History:  Diagnosis Date   Arthritis    Bradycardia    Cancer ([place]) 2013   skin cancer   Depression    ptsd   Dysrhythmia    chronic slow heart rate   GERD (gastroesophageal reflux disease)    Headache(784.0)    tension headaches non recent   History of chicken pox    History of kidney stones    passed   Hypertension    treated with HCTZ   Pacemaker    Parkinson's disease ([place])    dx'ed 15 years ago   PTSD (post-traumatic stress disorder)    Shortness of breath dyspnea    Sleep apnea    doesn't use C-pap   Varicose veins     Past Surgical History:  Procedure Laterality Date   CHOLECYSTECTOMY N/A 10/22/2014   Procedure: LAPAROSCOPIC CHOLECYSTECTOMY WITH INTRAOPERATIVE CHOLANGIOGRAM;  Surgeon: Dia Crawford III, MD;  Location: [facility] ORS;  Service: General;  Laterality: N/A;   cyst removed      from lip as a child   INTRAMEDULLARY (IM) NAIL INTERTROCHANTERIC N/A 02/18/2019   Procedure: INTRAMEDULLARY (IM) NAIL INTERTROCHANTRIC, RIGHT,;  Surgeon: [place] Park, MD;  Location: [facility] ORS;  Service: Orthopedics;   Laterality: N/A;   LUMBAR LAMINECTOMY/DECOMPRESSION MICRODISCECTOMY Bilateral 12/14/2012   Procedure: Bilateral lumbar three-four, four-five decompressive laminotomy/foraminotomy;  Surgeon: [place] Pitter, MD;  Location: [place] [facility]UR[facility] ORS;  Service: Neurosurgery;  Laterality: Bilateral;   PULSE GENERATOR IMPLANT Bilateral 12/13/2013   Procedure: Bilateral implantable pulse generator placement;  Surgeon: Erline Levine, MD;  Location: [place] NEURO ORS;  Service: Neurosurgery;  Laterality: Bilateral;  Bilateral implantable pulse generator placement   skin cancer removed     from ears,   12 lft arm  rt leg 15   SUBTHALAMIC STIMULATOR BATTERY REPLACEMENT Bilateral 07/14/2017   Procedure: BILATERAL IMPLANTED PULSE GENERATOR CHANGE FOR DEEP BRAIN STIMULATOR;  Surgeon: Erline Levine, MD;  Location: [address];  Service: Neurosurgery;  Laterality: Bilateral;   SUBTHALAMIC STIMULATOR INSERTION Bilateral 12/06/2013   Procedure: SUBTHALAMIC STIMULATOR INSERTION;  Surgeon: Erline Levine, MD;  Location: [place] [facility]UR[facility] ORS;  Service: Neurosurgery;  Laterality: Bilateral;  Bilateral deep brain stimulator placement    There were no vitals filed for this visit.   Subjective Assessment - 01/04/21 0935     Subjective Patient reports he was fixing his toilet and spent a lot of time getting up and down from the floor independently. Pt reports this increased his back pain. Patient describes the  pain as stabbing in nature.    Patient is accompained by: Family member    Pertinent History [name] is a 65yoM who comes [facility] neuro for evaluation of conitnued difficulty of imbalance, unsteadiness on feet, and generally limited tolerance to mobility in the setting of chronic parkinsonism. SInce DC in February, pt has had PPM placement due to bradycardia issues. Pt also started using an UpWalker device 2 week prior to this evaluation, goal was to help with gradual melting of postural extension that occured with sustained upright actiivty. Pt  has continued to practice supervised AMB with caregivers, short distances out of house and in community. Pt continues to experience frequent falls on a weekly basis, at a rate that both he and wife report to be increasing in frequency. PMH: HTN, bradycardia s/p PPM, PD, DBS, COVID-19 infection, chronic low back pain s/p L4/5 surgery.    Limitations Walking;Standing;Sitting;House hold activities;Lifting    How long can you sit comfortably? Not limited    How long can you stand comfortably? 3-5 minutes with support    How long can you walk comfortably? ~5-6 minutes UpWalker Walker    Patient Stated Goals Reduce falls, improve AMB tolerance    Currently in Pain? Yes    Pain Location Back    Pain Orientation Lower    Pain Descriptors / Indicators Stabbing    Pain Onset More than a month ago    Pain Onset More than a month ago             INTERVENTIONS-   Neuro Re-education: Continued emphasis on motor control, amplitude of movement, rotation   Seated:  Lean forward and bend followed by big sit up and upper extremity abduction 10x; continued cueing for increased finger abduction and shoulder abduction bilaterally --progressed to big reach down and reach up followed by abduction 10 times   The following exercises were performed with 1.5 pound weights on patient's wrists to increased level of resistance/intensity:   Seated large amplitude twist with UE reach across and clap- 10x each direction; Rates medium  With chair as support in front of patient so patient can hold onto chair with 1 upper extremity, patient standing with wide base of support.  Patient weight shifts to 1 side with big reach on same side -2x10 each direction; patient rates medium PT provides contact-guard assist  Standing, one UE support on chair, patient reaches forward with other upper extremity and goes into a mini squat then reaches out to side upon standing - 10x each side.  Cueing for large amplitude movement.    STS from standard height chair, continued emphasis on large amplitude movement, still wearing 1.5 pound weights on wrists -3x5 with forward reach/hand flicks.  PT cues patient for upright posture, focus on hip and knee extension. rates medium   Gait training- Gait training with obstacle course to promote scanning and navigating obstacles, as well as gait mechanics.  PT provides continued cueing for step length, upright posture, proximity to walker for 4x148 ft. additionally, PT he pulls slightly forward on walker to help patient increase step length and gait speed.  Patient continues to require frequent cueing to improve hip extension and knee extension in order to decrease crouched posture.  Added to HEP: PT provided handout and instructed patient in seated posture exercise with upper extremity abduction and seated trunk twist exercise with upper extremity reach. 10 reps of each, can be performed daily or 4x/week. Continued instruction and safety with exercises  such as performing chair with armrests.  Patient demonstrates and verbalizes understanding.  Pt reports no increased pain with interventions.   note: Portions of this document were prepared using Dragon voice recognition software and although reviewed may contain unintentional dictation errors in syntax, grammar, or spelling.    PT Education - 01/04/21 1028     Education provided Yes    Education Details Exercise technique, body mechanics with standing supported there ex, HEP    Person(s) Educated Patient    Methods Demonstration;Explanation;Tactile cues;Verbal cues;Handout    Comprehension Verbalized understanding;Returned demonstration;Verbal cues required;Tactile cues required;Need further instruction              PT Short Term Goals - 12/08/20 1649       PT SHORT TERM GOAL #1   Title Pt to demonstrate improved sustained AMB tolerance >867ft.    Baseline At evlauation: 634ft tolerance (not to failure)    Time 4    Period  Weeks    Status New    Target Date 01/05/21      PT SHORT TERM GOAL #2   Title Pt to demonstrate [place] >17/28 to show improvement in balance.    Baseline Eval: 15/28    Time 4    Period Weeks    Status New    Target Date 01/05/21      PT SHORT TERM GOAL #3   Title Pt to demonstrate 10MWT<13sec to show self-selected gait speed appropriate for AMB outside of house and to improve energy efficiency.    Baseline UpWalker Eval: 17.53sec    Time 4    Period Weeks    Status New    Target Date 01/05/21               PT Long Term Goals - 12/08/20 1654       PT LONG TERM GOAL #1   Title Pt to score MiniBesTest > 20/28 to show improved balance.    Baseline Eval: 15/28    Time 8    Period Weeks    Status New    Target Date 02/02/21      PT LONG TERM GOAL #2   Title Pt to tolerate 101ft sustained AMB with UpWalker without rest interval to improve safety and independence in limited community AMB.    Baseline At eval; tired after 662ft    Time 8    Period Weeks    Status New    Target Date 02/02/21      PT LONG TERM GOAL #3   Title Pt to demonstrate improved FOTO score by 5 points compared to initital assessment.    Time 8    Period Weeks    Status New    Target Date 03/02/21      PT LONG TERM GOAL #4   Title Pt to show tolerance of AMB>1335ft with UpWalker to improve tolerance to accessing the community for IADL.    Baseline Eval: 665ft    Time 12    Period Weeks    Status New    Target Date 03/02/21                   Plan - 01/04/21 1031     Clinical Impression Statement Patient shows improved technique with sit to stands, consistently performing with larger amplitude movement and increased speed without signs of decreased postural stability.  Patient rates majority of TherEX as moderate as he performs most exercises with 1.5 pound weights on wrists.  Emphasis this session was on improving patient's upright posture by focusing on hip extension  and knee extension.  The patient will benefit from further skilled PT to continue to improve mobility, balance, strength, and gait mechanics to override bradykinetic and hypokinetic movement associated with Parkinson's disease sx.    Personal Factors and Comorbidities Age;Past/Current Experience;Time since onset of injury/illness/exacerbation;Transportation    Comorbidities Chronic back pain, orthostatic hypotension, repeated falls, parkinsonism    Examination-Activity Limitations Bathing;Bed Mobility;Caring for Others;Bend;Dressing;Lift;Locomotion Level;Reach Overhead;Sit;Squat;Stairs;Stand;Toileting;Transfers;Carry    Examination-Participation Restrictions Cleaning;Community Activity;Interpersonal Relationship;Shop;Volunteer;Yard Work;Other;Driving    Stability/Clinical Decision Making Evolving/Moderate complexity    Rehab Potential Fair    PT Frequency 2x / week    PT Duration 12 weeks    PT Treatment/Interventions ADLs/Self Care Home Management;Aquatic Therapy;Cryotherapy;Electrical Stimulation;Iontophoresis 4mg /ml Dexamethasone;Moist Heat;Ultrasound;Contrast Bath;DME Instruction;Gait training;Stair training;Functional mobility training;Therapeutic activities;Therapeutic exercise;Balance training;Neuromuscular re-education;Patient/family education;Manual techniques;Wheelchair mobility training;Energy conservation;Passive range of motion;Traction;Orthotic Fit/Training;Dry needling;Vestibular;Visual/perceptual remediation/compensation;Taping;Canalith Repostioning;Cognitive remediation    PT Next Visit Plan Advanced AMB tolerance, use MiniBesTest to guide balance interventions seated rotation and balance exercises    PT Home Exercise Plan None set up at evaluation, still working on previous HEP from 6 months prior (STS, daily walking); 8/29: issued STS with focus on large amplitude movement at support surface; 9/12: PT provides handout with sit to stand exercise and large amplitude seated trunk rotation  exercise.  9/19: reinforced HEP, provided handout for seated postural exercise with UE abduction, and seated exercise with trunk twists and UE reach, 10x for each, can be performed daily or minimum of 4x/week.    Consulted and Agree with Plan of Care Patient;Family member/caregiver    Family Member Consulted Wife             Patient will benefit from skilled therapeutic intervention in order to improve the following deficits and impairments:  Abnormal gait, Decreased balance, Decreased coordination, Decreased mobility, Postural dysfunction, Decreased strength, Decreased safety awareness, Improper body mechanics, Impaired flexibility, Decreased activity tolerance, Decreased endurance, Decreased knowledge of precautions, Difficulty walking, Pain, Cardiopulmonary status limiting activity, Decreased range of motion, Impaired perceived functional ability, Decreased cognition, Dizziness  Visit Diagnosis: Other abnormalities of gait and mobility  Other lack of coordination  Unsteadiness on feet  Muscle weakness (generalized)  Parkinson's disease ([place])     Problem List Patient Active Problem List   Diagnosis Date Noted   Sinus drainage 12/09/2020   Medicare annual wellness visit, subsequent 11/18/2020   Pacemaker    Dementia associated with Parkinson's disease ([place]) 06/17/2020   Vertigo 02/19/2020   Urine abnormality 02/19/2020   Dysphagia 02/23/2019   Fall at home 10/18/2017   REM behavioral disorder 11/02/2016   Radicular pain in right arm 10/09/2016   GERD (gastroesophageal reflux disease) 07/31/2016   Colon cancer screening 07/23/2015   Gout 02/18/2015   Depression 01/06/2015   Skin lesion 10/02/2014   Dupuytren's contracture 08/20/2014   Cough 07/21/2014   Lumbar stenosis with neurogenic claudication 06/13/2014   S/P deep brain stimulator placement 05/08/2014   Advance care planning 01/19/2014   Parkinson's disease ([place]) 12/13/2013   PTSD (post-traumatic stress  disorder) 06/13/2013   Erectile dysfunction 06/13/2013   HLD (hyperlipidemia) 06/13/2013   Essential hypertension 06/03/2013   Bradycardia by electrocardiogram 06/03/2013   Obstructive sleep apnea 03/12/2013    Zollie Pee, PT 01/04/2021, 10:35 AM  [place] [address] [place], [place], [address] Phone: [phone]   Fax:  [phone]  Name: [name] MRN: [id number]  Date of Birth: 1950/11/08

## 2021-01-06 ENCOUNTER — Ambulatory Visit: Payer: Medicare PPO

## 2021-01-06 ENCOUNTER — Other Ambulatory Visit: Payer: Self-pay

## 2021-01-06 DIAGNOSIS — R2689 Other abnormalities of gait and mobility: Secondary | ICD-10-CM

## 2021-01-06 DIAGNOSIS — G20 Parkinson's disease: Secondary | ICD-10-CM | POA: Diagnosis not present

## 2021-01-06 DIAGNOSIS — R2681 Unsteadiness on feet: Secondary | ICD-10-CM

## 2021-01-06 DIAGNOSIS — F0280 Dementia in other diseases classified elsewhere without behavioral disturbance: Secondary | ICD-10-CM | POA: Diagnosis not present

## 2021-01-06 DIAGNOSIS — R41841 Cognitive communication deficit: Secondary | ICD-10-CM | POA: Diagnosis not present

## 2021-01-06 DIAGNOSIS — R278 Other lack of coordination: Secondary | ICD-10-CM | POA: Diagnosis not present

## 2021-01-06 DIAGNOSIS — R296 Repeated falls: Secondary | ICD-10-CM | POA: Diagnosis not present

## 2021-01-06 DIAGNOSIS — M6281 Muscle weakness (generalized): Secondary | ICD-10-CM | POA: Diagnosis not present

## 2021-01-06 DIAGNOSIS — R262 Difficulty in walking, not elsewhere classified: Secondary | ICD-10-CM | POA: Diagnosis not present

## 2021-01-06 NOTE — Patient Instructions (Signed)
Generate sentences about more family pictures to target recall, processing times, speech intelligibility Complete RMT exercises 3 times per day - 3 set of 10 each EMST and IMST

## 2021-01-06 NOTE — Therapy (Signed)
[place] MAIN [facility] SERVICES [address] [place], [place], [address] Phone: [phone]   Fax:  [phone]  Speech Language Pathology Treatment  Patient Details  Name: [name] MRN: [id number] Date of Birth: [birth year] Referring Provider (SLP): Elsie Stain   Encounter Date: 01/04/2021   End of Session - 01/06/21 0707     Visit Number 5    Number of Visits 17    Date for SLP Re-Evaluation 03/04/21    Authorization Type [employer] Choice PPO    Authorization Time Period 12/08/2020 thru 03/04/2021    Authorization - Visit Number 5    Progress Note Due on Visit 10    SLP Start Time 0845    SLP Stop Time  0930    SLP Time Calculation (min) 45 min    Activity Tolerance Patient tolerated treatment well;No increased pain             Past Medical History:  Diagnosis Date   Arthritis    Bradycardia    Cancer ([place]) 2013   skin cancer   Depression    ptsd   Dysrhythmia    chronic slow heart rate   GERD (gastroesophageal reflux disease)    Headache(784.0)    tension headaches non recent   History of chicken pox    History of kidney stones    passed   Hypertension    treated with HCTZ   Pacemaker    Parkinson's disease ([place])    dx'ed 15 years ago   PTSD (post-traumatic stress disorder)    Shortness of breath dyspnea    Sleep apnea    doesn't use C-pap   Varicose veins     Past Surgical History:  Procedure Laterality Date   CHOLECYSTECTOMY N/A 10/22/2014   Procedure: LAPAROSCOPIC CHOLECYSTECTOMY WITH INTRAOPERATIVE CHOLANGIOGRAM;  Surgeon: Dia Crawford III, MD;  Location: [facility] ORS;  Service: General;  Laterality: N/A;   cyst removed      from lip as a child   INTRAMEDULLARY (IM) NAIL INTERTROCHANTERIC N/A 02/18/2019   Procedure: INTRAMEDULLARY (IM) NAIL INTERTROCHANTRIC, RIGHT,;  Surgeon: Thornton Park, MD;  Location: [facility] ORS;  Service: Orthopedics;  Laterality: N/A;   LUMBAR LAMINECTOMY/DECOMPRESSION  MICRODISCECTOMY Bilateral 12/14/2012   Procedure: Bilateral lumbar three-four, four-five decompressive laminotomy/foraminotomy;  Surgeon: Charlie Pitter, MD;  Location: [place] NEURO ORS;  Service: Neurosurgery;  Laterality: Bilateral;   PULSE GENERATOR IMPLANT Bilateral 12/13/2013   Procedure: Bilateral implantable pulse generator placement;  Surgeon: Erline Levine, MD;  Location: [place] NEURO ORS;  Service: Neurosurgery;  Laterality: Bilateral;  Bilateral implantable pulse generator placement   skin cancer removed     from ears,   12 lft arm  rt leg 15   SUBTHALAMIC STIMULATOR BATTERY REPLACEMENT Bilateral 07/14/2017   Procedure: BILATERAL IMPLANTED PULSE GENERATOR CHANGE FOR DEEP BRAIN STIMULATOR;  Surgeon: Erline Levine, MD;  Location: [place];  Service: Neurosurgery;  Laterality: Bilateral;   SUBTHALAMIC STIMULATOR INSERTION Bilateral 12/06/2013   Procedure: SUBTHALAMIC STIMULATOR INSERTION;  Surgeon: Erline Levine, MD;  Location: [place] NEURO ORS;  Service: Neurosurgery;  Laterality: Bilateral;  Bilateral deep brain stimulator placement    There were no vitals filed for this visit.   Subjective Assessment - 01/06/21 0658     Subjective "I am tired, was on the floor a lot working on our toilet"    Patient is accompained by: Family member    Currently in Pain? Yes    Pain Score 4     Pain  Location Back    Pain Orientation Lower    Pain Descriptors / Indicators Stabbing    Pain Type Chronic pain    Pain Onset More than a month ago                   ADULT SLP TREATMENT - 01/06/21 0001       Treatment Provided   Treatment provided Cognitive-Linquistic      Cognitive-Linquistic Treatment   Treatment focused on Cognition;Dysarthria;Patient/family/caregiver education    Skilled Treatment COGNITIVE COMMUNICATION: pt brought in his EMST and IMST devices - EMST 150 device set at [facility] and IMST set at [facility] to complete 3 sets of 10 with self-perceived effort of 8 out of 10 - moderate cues for  slow rate and quick burst of air; SPEECH INTELLIGIBILITY: when reading 4 generated sentences about pt's personal picture speech intelligibility <50% d/t fast rate, imprecise articulation and low vocal intensity, with maximal cues and auditory feedback, pt able to increase vocal intensity to 75% dB and improve speech intelligibility to 75%              SLP Education - 01/06/21 0707     Education Details speech intelligibility    Person(s) Educated Patient;Spouse    Methods Explanation;Demonstration;Verbal cues    Comprehension Need further instruction              SLP Short Term Goals - 12/10/20 0801       SLP SHORT TERM GOAL #1   Title Pt will maintain speech volume appropriate for 80% intelligibilty in 3 minutes simple conversation with rare min A.    Baseline < 75%    Time 10    Period --   sessions   Status New      SLP SHORT TERM GOAL #2   Title Pt will use 1 external compensatory memory aid to recall orientation information with minimal assistance.    Baseline not currently using compensatory memory aids    Time 10    Period --   sessions   Status New      SLP SHORT TERM GOAL #3   Title Pt will sustain attention to basic task for 5 minutes with moderate assistance.    Baseline < 2 minutes of sustained attention    Time 10    Period --   sessions   Status New      SLP SHORT TERM GOAL #4   Title --              SLP Long Term Goals - 12/10/20 0814       SLP LONG TERM GOAL #1   Title Pt will use speech intelligibility strategies to achieve ~ 80% speech intelligiblity at the sentence level with intermittent supervision cues.    Baseline <75% speech intelligibility    Time 8    Period Weeks    Status New    Target Date 03/04/21      SLP LONG TERM GOAL #2   Title Patient will identify cognitive-communication barriers and participate in developing functional compensatory strategies.    Baseline new goal    Time 8    Period Weeks    Status New     Target Date 03/04/21      SLP LONG TERM GOAL #3   Title Pt will complete cognitive communication HEP.    Baseline new goal    Time 8    Period Weeks    Status New  Target Date 03/04/21              Plan - 01/06/21 0708     Clinical Impression Statement Pt presents with chronic increased processing time today. As a result, his moderate cognitive communication impairment, that is exacerbated by poor speech intelligibility, was more prevalent during today's session. Pt is unable to express wants/needs in a timely intelligible manner. As such, skilled ST intervention is required to improve pt's functional communication/independence and reduce caregiver burden.    Speech Therapy Frequency 2x / week    Duration 12 weeks    Treatment/Interventions Compensatory techniques;Internal/external aids;Functional tasks;SLP instruction and feedback;Patient/family education    Potential to Achieve Goals Fair    Potential Considerations Ability to learn/carryover information;Medical prognosis;Severity of impairments;Co-morbidities;Previous level of function    SLP Home Exercise Plan provided, see pt instructions section    Consulted and Agree with Plan of Care Patient;Family member/caregiver    Family Member Consulted [name]             Patient will benefit from skilled therapeutic intervention in order to improve the following deficits and impairments:   Cognitive communication deficit  Dysarthria and anarthria  Parkinson's disease ([place])  Dementia associated with Parkinson's disease ([place])    Problem List Patient Active Problem List   Diagnosis Date Noted   Sinus drainage 12/09/2020   Medicare annual wellness visit, subsequent 11/18/2020   Pacemaker    Dementia associated with Parkinson's disease ([place]) 06/17/2020   Vertigo 02/19/2020   Urine abnormality 02/19/2020   Dysphagia 02/23/2019   Fall at home 10/18/2017   REM behavioral disorder 11/02/2016   Radicular pain in right  arm 10/09/2016   GERD (gastroesophageal reflux disease) 07/31/2016   Colon cancer screening 07/23/2015   Gout 02/18/2015   Depression 01/06/2015   Skin lesion 10/02/2014   Dupuytren's contracture 08/20/2014   Cough 07/21/2014   Lumbar stenosis with neurogenic claudication 06/13/2014   S/P deep brain stimulator placement 05/08/2014   Advance care planning 01/19/2014   Parkinson's disease ([place]) 12/13/2013   PTSD (post-traumatic stress disorder) 06/13/2013   Erectile dysfunction 06/13/2013   HLD (hyperlipidemia) 06/13/2013   Essential hypertension 06/03/2013   Bradycardia by electrocardiogram 06/03/2013   Obstructive sleep apnea 03/12/2013   Coretha Creswell B. Rutherford Nail M.S., CCC-SLP, [facility] Speech-Language Pathologist Rehabilitation Services Office [phone]  Stormy Fabian 01/06/2021, [place] MAIN [facility] SERVICES [address] [place], [place], [address] Phone: [phone]   Fax:  [phone]   Name: [name] MRN: [id number] Date of Birth: [birth year]

## 2021-01-06 NOTE — Therapy (Signed)
[place] MAIN [facility] SERVICES [address] [place], [place], [address] Phone: [phone]   Fax:  [phone]  Physical Therapy Treatment  Patient Details  Name: [name] MRN: [id number] Date of Birth: [birth year] Referring Provider (PT): Elsie Stain, MD   Encounter Date: 01/06/2021   PT End of Session - 01/06/21 0935     Visit Number 7    Number of Visits 24    Date for PT Re-Evaluation 01/05/21    Authorization Type [employer] Medicare Choice PPO    Authorization Time Period 12/08/20-03/02/21    Progress Note Due on Visit 10    PT Start Time 0845    PT Stop Time 0928    PT Time Calculation (min) 43 min    Equipment Utilized During Treatment Gait belt    Activity Tolerance Patient tolerated treatment well;No increased pain    Behavior During Therapy WFL for tasks assessed/performed             Past Medical History:  Diagnosis Date   Arthritis    Bradycardia    Cancer ([place]) 2013   skin cancer   Depression    ptsd   Dysrhythmia    chronic slow heart rate   GERD (gastroesophageal reflux disease)    Headache(784.0)    tension headaches non recent   History of chicken pox    History of kidney stones    passed   Hypertension    treated with HCTZ   Pacemaker    Parkinson's disease ([place])    dx'ed 15 years ago   PTSD (post-traumatic stress disorder)    Shortness of breath dyspnea    Sleep apnea    doesn't use C-pap   Varicose veins     Past Surgical History:  Procedure Laterality Date   CHOLECYSTECTOMY N/A 10/22/2014   Procedure: LAPAROSCOPIC CHOLECYSTECTOMY WITH INTRAOPERATIVE CHOLANGIOGRAM;  Surgeon: Dia Crawford III, MD;  Location: [facility] ORS;  Service: General;  Laterality: N/A;   cyst removed      from lip as a child   INTRAMEDULLARY (IM) NAIL INTERTROCHANTERIC N/A 02/18/2019   Procedure: INTRAMEDULLARY (IM) NAIL INTERTROCHANTRIC, RIGHT,;  Surgeon: Thornton Park, MD;  Location: [facility] ORS;  Service: Orthopedics;   Laterality: N/A;   LUMBAR LAMINECTOMY/DECOMPRESSION MICRODISCECTOMY Bilateral 12/14/2012   Procedure: Bilateral lumbar three-four, four-five decompressive laminotomy/foraminotomy;  Surgeon: Charlie Pitter, MD;  Location: [place] NEURO ORS;  Service: Neurosurgery;  Laterality: Bilateral;   PULSE GENERATOR IMPLANT Bilateral 12/13/2013   Procedure: Bilateral implantable pulse generator placement;  Surgeon: Erline Levine, MD;  Location: [place] NEURO ORS;  Service: Neurosurgery;  Laterality: Bilateral;  Bilateral implantable pulse generator placement   skin cancer removed     from ears,   12 lft arm  rt leg 15   SUBTHALAMIC STIMULATOR BATTERY REPLACEMENT Bilateral 07/14/2017   Procedure: BILATERAL IMPLANTED PULSE GENERATOR CHANGE FOR DEEP BRAIN STIMULATOR;  Surgeon: Erline Levine, MD;  Location: [place];  Service: Neurosurgery;  Laterality: Bilateral;   SUBTHALAMIC STIMULATOR INSERTION Bilateral 12/06/2013   Procedure: SUBTHALAMIC STIMULATOR INSERTION;  Surgeon: Erline Levine, MD;  Location: [place] NEURO ORS;  Service: Neurosurgery;  Laterality: Bilateral;  Bilateral deep brain stimulator placement    There were no vitals filed for this visit.   Subjective Assessment - 01/06/21 0845     Subjective Pt reports no back pain currently. Says he was exhausted after last appointment, but felt better the following day. Pt spouse present and reports pt has an easier time getting up  from the floor and that he is able to do it independently.    Patient is accompained by: Family member    Pertinent History [name] is a 17yoM who comes to [facility] neuro for evaluation of conitnued difficulty of imbalance, unsteadiness on feet, and generally limited tolerance to mobility in the setting of chronic parkinsonism. SInce DC in February, pt has had PPM placement due to bradycardia issues. Pt also started using an UpWalker device 2 week prior to this evaluation, goal was to help with gradual melting of postural extension that occured with  sustained upright actiivty. Pt has continued to practice supervised AMB with caregivers, short distances out of house and in community. Pt continues to experience frequent falls on a weekly basis, at a rate that both he and wife report to be increasing in frequency. PMH: HTN, bradycardia s/p PPM, PD, DBS, COVID-19 infection, chronic low back pain s/p L4/5 surgery.    Limitations Walking;Standing;Sitting;House hold activities;Lifting    How long can you sit comfortably? Not limited    How long can you stand comfortably? 3-5 minutes with support    How long can you walk comfortably? ~5-6 minutes UpWalker Walker    Patient Stated Goals Reduce falls, improve AMB tolerance    Currently in Pain? No/denies    Pain Onset More than a month ago    Pain Onset More than a month ago            INTERVENTIONS-    Neuro Re-education: Continued emphasis on motor control, standing large amplitude movement, rotation   The patient donned 1.5 pound weights on bilateral upper extremities to perform all of the following interventions  Seated:  Lean forward and bend followed by big sit up and upper extremity abduction 10x with 5 sec hold; rates easy. continued cueing for shoulder abduction bilaterally -- Next patient performed big reach down and reach up followed by shoulder abduction 10 times with 5 sec hold for each rep  Seated large amplitude twist with UE reach across and clap- 10x each direction;   Isolated seated trunk twists with arms across chest 10x each direction; cueing for larger amplitude movement   Chair placed in front of patient patient holds onto a chair with 1 upper extremity for support, and patient standing with wide base of support.  Patient weight shifts to 1 side with big reach on same side  -12x each direction; patient rates medium PT provides contact-guard assist throughout due to decreased postural stability  Chair placed in front of patient for support, one UE support on chair, pt  performs side step with reach 12x; rates medium; continued cueing for large amplitude movement to override bradykinesia and hypokinesia   Standing, one UE support on chair, patient reaches forward with other upper extremity and goes into a mini squat then reaches out to side upon standing - 10x each side.  Cueing for large amplitude movement and hand flicks   STS from standard height chair, continued emphasis on large amplitude movement, -10x, 5x with forward reach/hand flicks.  PT continues to cue patient for upright posture, focus on hip and knee extension.  Patient requires over 60% cueing for hip and knee extension.  Patient rates medium.   Gait training-patient ambulates for 4 laps of 148 feet around PT clinic with walker and additionally ambulates through agility ladder (6x) to promote equal and increased step-length, proved sequencing.  PT also pulls slightly forward on walker to promote increased gait speed and increased step  length.  Patient does exhibit improvement with this technique.  note: Portions of this document were prepared using Dragon voice recognition software and although reviewed may contain unintentional dictation errors in syntax, grammar, or spelling.      PT Education - 01/06/21 0934     Education provided Yes    Education Details exercise technique, body mechanics with gait and rotatoinal exercises    Person(s) Educated Patient    Methods Explanation;Demonstration;Tactile cues;Verbal cues    Comprehension Verbalized understanding;Returned demonstration;Verbal cues required;Tactile cues required;Need further instruction              PT Short Term Goals - 12/08/20 1649       PT SHORT TERM GOAL #1   Title Pt to demonstrate improved sustained AMB tolerance >849ft.    Baseline At evlauation: 674ft tolerance (not to failure)    Time 4    Period Weeks    Status New    Target Date 01/05/21      PT SHORT TERM GOAL #2   Title Pt to demonstrate [place]  >17/28 to show improvement in balance.    Baseline Eval: 15/28    Time 4    Period Weeks    Status New    Target Date 01/05/21      PT SHORT TERM GOAL #3   Title Pt to demonstrate 10MWT<13sec to show self-selected gait speed appropriate for AMB outside of house and to improve energy efficiency.    Baseline UpWalker Eval: 17.53sec    Time 4    Period Weeks    Status New    Target Date 01/05/21               PT Long Term Goals - 12/08/20 1654       PT LONG TERM GOAL #1   Title Pt to score MiniBesTest > 20/28 to show improved balance.    Baseline Eval: 15/28    Time 8    Period Weeks    Status New    Target Date 02/02/21      PT LONG TERM GOAL #2   Title Pt to tolerate 1094ft sustained AMB with UpWalker without rest interval to improve safety and independence in limited community AMB.    Baseline At eval; tired after 651ft    Time 8    Period Weeks    Status New    Target Date 02/02/21      PT LONG TERM GOAL #3   Title Pt to demonstrate improved FOTO score by 5 points compared to initital assessment.    Time 8    Period Weeks    Status New    Target Date 03/02/21      PT LONG TERM GOAL #4   Title Pt to show tolerance of AMB>1364ft with UpWalker to improve tolerance to accessing the community for IADL.    Baseline Eval: 690ft    Time 12    Period Weeks    Status New    Target Date 03/02/21                   Plan - 01/06/21 1720     Clinical Impression Statement Patient shows progress by tolerating increased volume of interventions.  Patient is progressing to performing more standing rotational and stepping exercises to target both balance and generalized mobility.  Patient still struggles most with awareness for hip extension and knee extension to achieve upright posture with gait and standing.  Patient will benefit from  further skilled PT to continue to improve mobility, balance, strength, and gait mechanics to increase ease and safety with ADLs.     Personal Factors and Comorbidities Age;Past/Current Experience;Time since onset of injury/illness/exacerbation;Transportation    Comorbidities Chronic back pain, orthostatic hypotension, repeated falls, parkinsonism    Examination-Activity Limitations Bathing;Bed Mobility;Caring for Others;Bend;Dressing;Lift;Locomotion Level;Reach Overhead;Sit;Squat;Stairs;Stand;Toileting;Transfers;Carry    Examination-Participation Restrictions Cleaning;Community Activity;Interpersonal Relationship;Shop;Volunteer;Yard Work;Other;Driving    Stability/Clinical Decision Making Evolving/Moderate complexity    Rehab Potential Fair    PT Frequency 2x / week    PT Duration 12 weeks    PT Treatment/Interventions ADLs/Self Care Home Management;Aquatic Therapy;Cryotherapy;Electrical Stimulation;Iontophoresis 4mg /ml Dexamethasone;Moist Heat;Ultrasound;Contrast Bath;DME Instruction;Gait training;Stair training;Functional mobility training;Therapeutic activities;Therapeutic exercise;Balance training;Neuromuscular re-education;Patient/family education;Manual techniques;Wheelchair mobility training;Energy conservation;Passive range of motion;Traction;Orthotic Fit/Training;Dry needling;Vestibular;Visual/perceptual remediation/compensation;Taping;Canalith Repostioning;Cognitive remediation    PT Next Visit Plan Advanced AMB tolerance, use MiniBesTest to guide balance interventions seated rotation and balance exercises    PT Home Exercise Plan None set up at evaluation, still working on previous HEP from 6 months prior (STS, daily walking); 8/29: issued STS with focus on large amplitude movement at support surface; 9/12: PT provides handout with sit to stand exercise and large amplitude seated trunk rotation exercise.  9/19: reinforced HEP, provided handout for seated postural exercise with UE abduction, and seated exercise with trunk twists and UE reach, 10x for each, can be performed daily or minimum of 4x/week.    Consulted and Agree  with Plan of Care Patient;Family member/caregiver    Family Member Consulted Wife             Patient will benefit from skilled therapeutic intervention in order to improve the following deficits and impairments:  Abnormal gait, Decreased balance, Decreased coordination, Decreased mobility, Postural dysfunction, Decreased strength, Decreased safety awareness, Improper body mechanics, Impaired flexibility, Decreased activity tolerance, Decreased endurance, Decreased knowledge of precautions, Difficulty walking, Pain, Cardiopulmonary status limiting activity, Decreased range of motion, Impaired perceived functional ability, Decreased cognition, Dizziness  Visit Diagnosis: Other abnormalities of gait and mobility  Other lack of coordination  Unsteadiness on feet  Parkinson's disease ([place])     Problem List Patient Active Problem List   Diagnosis Date Noted   Sinus drainage 12/09/2020   Medicare annual wellness visit, subsequent 11/18/2020   Pacemaker    Dementia associated with Parkinson's disease ([place]) 06/17/2020   Vertigo 02/19/2020   Urine abnormality 02/19/2020   Dysphagia 02/23/2019   Fall at home 10/18/2017   REM behavioral disorder 11/02/2016   Radicular pain in right arm 10/09/2016   GERD (gastroesophageal reflux disease) 07/31/2016   Colon cancer screening 07/23/2015   Gout 02/18/2015   Depression 01/06/2015   Skin lesion 10/02/2014   Dupuytren's contracture 08/20/2014   Cough 07/21/2014   Lumbar stenosis with neurogenic claudication 06/13/2014   S/P deep brain stimulator placement 05/08/2014   Advance care planning 01/19/2014   Parkinson's disease ([place]) 12/13/2013   PTSD (post-traumatic stress disorder) 06/13/2013   Erectile dysfunction 06/13/2013   HLD (hyperlipidemia) 06/13/2013   Essential hypertension 06/03/2013   Bradycardia by electrocardiogram 06/03/2013   Obstructive sleep apnea 03/12/2013    Zollie Pee, PT 01/06/2021, 5:23 PM  [place] MAIN [facility] SERVICES [address] [place], [place], [address] Phone: [phone]   Fax:  [phone]  Name: [name] MRN: [id number] Date of Birth: 1950-12-22

## 2021-01-07 DIAGNOSIS — H04123 Dry eye syndrome of bilateral lacrimal glands: Secondary | ICD-10-CM | POA: Diagnosis not present

## 2021-01-08 ENCOUNTER — Ambulatory Visit: Payer: Medicare PPO | Admitting: Speech Pathology

## 2021-01-11 ENCOUNTER — Ambulatory Visit: Payer: Medicare PPO

## 2021-01-11 ENCOUNTER — Ambulatory Visit: Payer: Medicare PPO | Admitting: Speech Pathology

## 2021-01-11 ENCOUNTER — Other Ambulatory Visit: Payer: Self-pay

## 2021-01-11 DIAGNOSIS — R296 Repeated falls: Secondary | ICD-10-CM | POA: Diagnosis not present

## 2021-01-11 DIAGNOSIS — R41841 Cognitive communication deficit: Secondary | ICD-10-CM

## 2021-01-11 DIAGNOSIS — R278 Other lack of coordination: Secondary | ICD-10-CM | POA: Diagnosis not present

## 2021-01-11 DIAGNOSIS — G20 Parkinson's disease: Secondary | ICD-10-CM

## 2021-01-11 DIAGNOSIS — F0280 Dementia in other diseases classified elsewhere without behavioral disturbance: Secondary | ICD-10-CM | POA: Diagnosis not present

## 2021-01-11 DIAGNOSIS — R262 Difficulty in walking, not elsewhere classified: Secondary | ICD-10-CM | POA: Diagnosis not present

## 2021-01-11 DIAGNOSIS — M6281 Muscle weakness (generalized): Secondary | ICD-10-CM | POA: Diagnosis not present

## 2021-01-11 DIAGNOSIS — R2681 Unsteadiness on feet: Secondary | ICD-10-CM | POA: Diagnosis not present

## 2021-01-11 DIAGNOSIS — R2689 Other abnormalities of gait and mobility: Secondary | ICD-10-CM | POA: Diagnosis not present

## 2021-01-11 DIAGNOSIS — R471 Dysarthria and anarthria: Secondary | ICD-10-CM

## 2021-01-11 NOTE — Therapy (Signed)
[place] MAIN [facility] SERVICES [address] [place], [place], [address] Phone: [phone]   Fax:  [phone]  Physical Therapy Treatment/RECERT  Patient Details  Name: [name] MRN: [id number] Date of Birth: 1950/10/14 Referring Provider (PT): Elsie Stain, MD   Encounter Date: 01/11/2021   PT End of Session - 01/11/21 1710     Visit Number 8    Number of Visits 24    Date for PT Re-Evaluation 03/08/21    Authorization Type [employer] Medicare Choice PPO    Authorization Time Period recert for 0/81-44/81    Progress Note Due on Visit 10    PT Start Time 1008    PT Stop Time 1052    PT Time Calculation (min) 44 min    Equipment Utilized During Treatment Gait belt    Activity Tolerance Patient tolerated treatment well    Behavior During Therapy WFL for tasks assessed/performed             Past Medical History:  Diagnosis Date   Arthritis    Bradycardia    Cancer ([place]) 2013   skin cancer   Depression    ptsd   Dysrhythmia    chronic slow heart rate   GERD (gastroesophageal reflux disease)    Headache(784.0)    tension headaches non recent   History of chicken pox    History of kidney stones    passed   Hypertension    treated with HCTZ   Pacemaker    Parkinson's disease ([place])    dx'ed 15 years ago   PTSD (post-traumatic stress disorder)    Shortness of breath dyspnea    Sleep apnea    doesn't use C-pap   Varicose veins     Past Surgical History:  Procedure Laterality Date   CHOLECYSTECTOMY N/A 10/22/2014   Procedure: LAPAROSCOPIC CHOLECYSTECTOMY WITH INTRAOPERATIVE CHOLANGIOGRAM;  Surgeon: Dia Crawford III, MD;  Location: [facility] ORS;  Service: General;  Laterality: N/A;   cyst removed      from lip as a child   INTRAMEDULLARY (IM) NAIL INTERTROCHANTERIC N/A 02/18/2019   Procedure: INTRAMEDULLARY (IM) NAIL INTERTROCHANTRIC, RIGHT,;  Surgeon: Thornton Park, MD;  Location: [facility] ORS;  Service: Orthopedics;   Laterality: N/A;   LUMBAR LAMINECTOMY/DECOMPRESSION MICRODISCECTOMY Bilateral 12/14/2012   Procedure: Bilateral lumbar three-four, four-five decompressive laminotomy/foraminotomy;  Surgeon: Charlie Pitter, MD;  Location: [place] NEURO ORS;  Service: Neurosurgery;  Laterality: Bilateral;   PULSE GENERATOR IMPLANT Bilateral 12/13/2013   Procedure: Bilateral implantable pulse generator placement;  Surgeon: Erline Levine, MD;  Location: [place] NEURO ORS;  Service: Neurosurgery;  Laterality: Bilateral;  Bilateral implantable pulse generator placement   skin cancer removed     from ears,   12 lft arm  rt leg 15   SUBTHALAMIC STIMULATOR BATTERY REPLACEMENT Bilateral 07/14/2017   Procedure: BILATERAL IMPLANTED PULSE GENERATOR CHANGE FOR DEEP BRAIN STIMULATOR;  Surgeon: Erline Levine, MD;  Location: [place];  Service: Neurosurgery;  Laterality: Bilateral;   SUBTHALAMIC STIMULATOR INSERTION Bilateral 12/06/2013   Procedure: SUBTHALAMIC STIMULATOR INSERTION;  Surgeon: Erline Levine, MD;  Location: [place] NEURO ORS;  Service: Neurosurgery;  Laterality: Bilateral;  Bilateral deep brain stimulator placement    There were no vitals filed for this visit.    Subjective Assessment - 01/11/21 1009     Subjective Pt reports no pain currently.  Pt spouse present and says pt having difficulty consistently using handles with walker.    Patient is accompained by: Family member  Pertinent History [name] is a 22yoM who comes to [facility] neuro for evaluation of conitnued difficulty of imbalance, unsteadiness on feet, and generally limited tolerance to mobility in the setting of chronic parkinsonism. SInce DC in February, pt has had PPM placement due to bradycardia issues. Pt also started using an UpWalker device 2 week prior to this evaluation, goal was to help with gradual melting of postural extension that occured with sustained upright actiivty. Pt has continued to practice supervised AMB with caregivers, short distances out of house  and in community. Pt continues to experience frequent falls on a weekly basis, at a rate that both he and wife report to be increasing in frequency. PMH: HTN, bradycardia s/p PPM, PD, DBS, COVID-19 infection, chronic low back pain s/p L4/5 surgery.    Limitations Walking;Standing;Sitting;House hold activities;Lifting    How long can you sit comfortably? Not limited    How long can you stand comfortably? 3-5 minutes with support    How long can you walk comfortably? ~5-6 minutes UpWalker Walker    Patient Stated Goals Reduce falls, improve AMB tolerance    Currently in Pain? No/denies    Pain Onset More than a month ago    Pain Onset More than a month ago             INTERVENTIONS - RECERT  Ambulation for endurance/activity tolerance - 1384 ft (takes pt approx 9 min) prior to needing rest break. Shows decrease in gaits mechanics as time goes on d/t fatigue.   10 MWT - 0.74 m/s  with upwalker, CGA  miniBest-defer - defferred   FOTO 49  Walking for step-length exercise Set 1: 19 steps Set 2: 16 steps Set 3: 16 steps Set 4: 17 steps  Cuing for upright posture, increased step-length. Difficulty with sustaining upright posture   Seated:  Lean forward and bend followed by big sit up and upper extremity abduction 15x; rates easy. continued cueing for shoulder abduction bilaterally  Seated arms crossed trunk twists 10x each direction for large amplitude twist  STS from standard height chair, continued emphasis on large amplitude movement, -2x10 with forward reach/hand flicks.  PT continues to cue patient for upright posture, focus on hip and knee extension with occassional TC to knees.   Chair placed in front of patient patient holds onto a chair with 1 upper extremity for support, and patient standing with wide base of support.  Patient weight shifts to 1 side with big reach on same side  -2x10 each direction; patient rates medium PT provides contact-guard assist throughout due to  decreased postural stability, cuing for upright posture, pt with difficulty correcting    note: Portions of this document were prepared using Dragon voice recognition software and although reviewed may contain unintentional dictation errors in syntax, grammar, or spelling.      PT Short Term Goals - 01/11/21 1010       PT SHORT TERM GOAL #1   Title Pt to demonstrate improved sustained AMB tolerance >814f.    Baseline At evlauation: 6072ftolerance (not to failure); 9/26: 1384 ft    Time 4    Period Weeks    Status Achieved    Target Date 01/05/21      PT SHORT TERM GOAL #2   Title Pt to demonstrate MiVail17/28 to show improvement in balance.    Baseline Eval: 15/28; 9/26: deferred    Time 4    Period Weeks    Status On-going  Target Date 02/08/21      PT SHORT TERM GOAL #3   Title Pt to demonstrate 10MWT<13sec to show self-selected gait speed appropriate for AMB outside of house and to improve energy efficiency.    Baseline UpWalker Eval: 17.53sec; 9/26: 0.74 m/s (13.5)    Time 4    Period Weeks    Status Partially Met    Target Date 02/08/21               PT Long Term Goals - 01/11/21 1030       PT LONG TERM GOAL #1   Title Pt to score MiniBesTest > 20/28 to show improved balance.    Baseline Eval: 15/28; 9/26: deferred    Time 8    Period Weeks    Status On-going    Target Date 03/08/21      PT LONG TERM GOAL #2   Title Pt to tolerate 1027f sustained AMB with UpWalker without rest interval to improve safety and independence in limited community AMB.    Baseline At eval; tired after 6037f 9/26: 1384 ft    Time 8    Period Weeks    Status Achieved      PT LONG TERM GOAL #3   Title Pt to demonstrate improved FOTO score by 5 points compared to initital assessment.    Baseline 9/26: 49 (eval was 50)    Time 8    Period Weeks    Status On-going    Target Date 03/08/21      PT LONG TERM GOAL #4   Title Pt to show tolerance of AMB>130088fwith UpWalker to improve tolerance to accessing the community for IADL.    Baseline Eval: 600f42f9/26: 1385    Time 12    Period Weeks    Status Achieved      PT LONG TERM GOAL #5   Title Patient will increase six minute walk test distance to >1000 for progression to community ambulator and improve gait ability    Baseline 9/26: new    Time 8    Period Weeks    Status New    Target Date 03/08/21               Plan - 01/12/21 [id number] Clinical Impression Statement Goals reassessed for recertification. Pt making gains with activity tolerance/ambulatory ability, ambulating >1300 ft in 9 minutes prior to needing a rest break. Pt also with improved 10MWT gait speed compared to evalaution (0.74 m/s today). Pt saw one point increase in FOTO, where pt spouse assisted pt in filling it out, inidcating similar perceived functional mobility and QOL compared to eval. Overall, pt with improved functional capacity and gait ability this reporting period. The pt will benefit from further skilled PT to continue to improve mobility, gait, balance and strength to increase ease and safety with ADLs.    Personal Factors and Comorbidities Age;Past/Current Experience;Time since onset of injury/illness/exacerbation;Transportation    Comorbidities Chronic back pain, orthostatic hypotension, repeated falls, parkinsonism    Examination-Activity Limitations Bathing;Bed Mobility;Caring for Others;Bend;Dressing;Lift;Locomotion Level;Reach Overhead;Sit;Squat;Stairs;Stand;Toileting;Transfers;Carry    Examination-Participation Restrictions Cleaning;Community Activity;Interpersonal Relationship;Shop;Volunteer;Yard Work;Other;Driving    Stability/Clinical Decision Making Evolving/Moderate complexity    Rehab Potential Fair    PT Frequency 2x / week    PT Duration 12 weeks    PT Treatment/Interventions ADLs/Self Care Home Management;Aquatic Therapy;Cryotherapy;Electrical Stimulation;Iontophoresis 4mg/76m Dexamethasone;Moist Heat;Ultrasound;Contrast Bath;DME Instruction;Gait training;Stair training;Functional mobility training;Therapeutic activities;Therapeutic exercise;Balance training;Neuromuscular re-education;Patient/family education;Manual techniques;Wheelchair mobility training;Energy conservation;Passive  range of motion;Traction;Orthotic Fit/Training;Dry needling;Vestibular;Visual/perceptual remediation/compensation;Taping;Canalith Repostioning;Cognitive remediation    PT Next Visit Plan continue large amplitude movement training, gait training, perform minibest    PT Home Exercise Plan None set up at evaluation, still working on previous HEP from 6 months prior (STS, Mcgee walking); 8/29: issued STS with focus on large amplitude movement at support surface; 9/12: PT provides handout with sit to stand exercise and large amplitude seated trunk rotation exercise.  9/19: reinforced HEP, provided handout for seated postural exercise with UE abduction, and seated exercise with trunk twists and UE reach, 10x for each, can be performed Mcgee or minimum of 4x/week.; no updates on this date    Consulted and Agree with Plan of Care Patient;Family member/caregiver    Family Member Consulted Wife             Patient will benefit from skilled therapeutic intervention in order to improve the following deficits and impairments:  Abnormal gait, Decreased balance, Decreased coordination, Decreased mobility, Postural dysfunction, Decreased strength, Decreased safety awareness, Improper body mechanics, Impaired flexibility, Decreased activity tolerance, Decreased endurance, Decreased knowledge of precautions, Difficulty walking, Pain, Cardiopulmonary status limiting activity, Decreased range of motion, Impaired perceived functional ability, Decreased cognition, Dizziness  Visit Diagnosis: Other abnormalities of gait and mobility  Other lack of coordination  Unsteadiness on feet  Muscle weakness  (generalized)  Parkinson's disease ([place])     Problem List Patient Active Problem List   Diagnosis Date Noted   Sinus drainage 12/09/2020   Medicare annual wellness visit, subsequent 11/18/2020   Pacemaker    Dementia associated with Parkinson's disease ([employer]) 06/17/2020   Vertigo 02/19/2020   Urine abnormality 02/19/2020   Dysphagia 02/23/2019   Fall at home 10/18/2017   REM behavioral disorder 11/02/2016   Radicular pain in right arm 10/09/2016   GERD (gastroesophageal reflux disease) 07/31/2016   Colon cancer screening 07/23/2015   Gout 02/18/2015   Depression 01/06/2015   Skin lesion 10/02/2014   Dupuytren's contracture 08/20/2014   Cough 07/21/2014   Lumbar stenosis with neurogenic claudication 06/13/2014   S/P deep brain stimulator placement 05/08/2014   Advance care planning 01/19/2014   Parkinson's disease ([place]) 12/13/2013   PTSD (post-traumatic stress disorder) 06/13/2013   Erectile dysfunction 06/13/2013   HLD (hyperlipidemia) 06/13/2013   Essential hypertension 06/03/2013   Bradycardia by electrocardiogram 06/03/2013   Obstructive sleep apnea 03/12/2013    Zollie Pee, PT 01/12/2021, 8:16 AM  [place] [address] [place], [place], [address] Phone: [phone]   Fax:  [phone]  Name: [name] MRN: [id number] Date of Birth: 08-18-1950

## 2021-01-12 NOTE — Patient Instructions (Signed)
Create outline of potential Bible lesson targeting Edison Nasuti wrestling with the Mcgee

## 2021-01-12 NOTE — Therapy (Signed)
[place] MAIN [facility] SERVICES [address] [place], [place], [address] Phone: [phone]   Fax:  [phone]  Speech Language Pathology Treatment  Patient Details  Name: [name] MRN: [id number] Date of Birth: 04-14-1951 Referring Provider (SLP): Elsie Stain   Encounter Date: 01/11/2021   End of Session - 01/12/21 0841     Visit Number 6    Number of Visits 17    Date for SLP Re-Evaluation 03/04/21    Authorization Type [employer] Medicare Choice PPO    Authorization Time Period 12/08/2020 thru 03/04/2021    Authorization - Visit Number 6    Progress Note Due on Visit 10    SLP Start Time 0910    SLP Stop Time  1000    SLP Time Calculation (min) 50 min    Activity Tolerance Patient tolerated treatment well             Past Medical History:  Diagnosis Date   Arthritis    Bradycardia    Cancer ([place]) 2013   skin cancer   Depression    ptsd   Dysrhythmia    chronic slow heart rate   GERD (gastroesophageal reflux disease)    Headache(784.0)    tension headaches non recent   History of chicken pox    History of kidney stones    passed   Hypertension    treated with HCTZ   Pacemaker    Parkinson's disease ([place])    dx'ed 15 years ago   PTSD (post-traumatic stress disorder)    Shortness of breath dyspnea    Sleep apnea    doesn't use C-pap   Varicose veins     Past Surgical History:  Procedure Laterality Date   CHOLECYSTECTOMY N/A 10/22/2014   Procedure: LAPAROSCOPIC CHOLECYSTECTOMY WITH INTRAOPERATIVE CHOLANGIOGRAM;  Surgeon: Dia Crawford III, MD;  Location: [facility] ORS;  Service: General;  Laterality: N/A;   cyst removed      from lip as a child   INTRAMEDULLARY (IM) NAIL INTERTROCHANTERIC N/A 02/18/2019   Procedure: INTRAMEDULLARY (IM) NAIL INTERTROCHANTRIC, RIGHT,;  Surgeon: Thornton Park, MD;  Location: [facility] ORS;  Service: Orthopedics;  Laterality: N/A;   LUMBAR LAMINECTOMY/DECOMPRESSION MICRODISCECTOMY Bilateral  12/14/2012   Procedure: Bilateral lumbar three-four, four-five decompressive laminotomy/foraminotomy;  Surgeon: Charlie Pitter, MD;  Location: [place] NEURO ORS;  Service: Neurosurgery;  Laterality: Bilateral;   PULSE GENERATOR IMPLANT Bilateral 12/13/2013   Procedure: Bilateral implantable pulse generator placement;  Surgeon: Erline Levine, MD;  Location: [place] NEURO ORS;  Service: Neurosurgery;  Laterality: Bilateral;  Bilateral implantable pulse generator placement   skin cancer removed     from ears,   12 lft arm  rt leg 15   SUBTHALAMIC STIMULATOR BATTERY REPLACEMENT Bilateral 07/14/2017   Procedure: BILATERAL IMPLANTED PULSE GENERATOR CHANGE FOR DEEP BRAIN STIMULATOR;  Surgeon: Erline Levine, MD;  Location: [place];  Service: Neurosurgery;  Laterality: Bilateral;   SUBTHALAMIC STIMULATOR INSERTION Bilateral 12/06/2013   Procedure: SUBTHALAMIC STIMULATOR INSERTION;  Surgeon: Erline Levine, MD;  Location: [place] NEURO ORS;  Service: Neurosurgery;  Laterality: Bilateral;  Bilateral deep brain stimulator placement    There were no vitals filed for this visit.   Subjective Assessment - 01/12/21 0837     Subjective delayed verbal responses and slowed physical movements    Patient is accompained by: Family member    Currently in Pain? No/denies                   ADULT  SLP TREATMENT - 01/12/21 0001       Treatment Provided   Treatment provided Cognitive-Linquistic      Cognitive-Linquistic Treatment   Treatment focused on Cognition;Dysarthria;Patient/family/caregiver education    Skilled Treatment COGNITIVE COMMUNICATION: despite maximal multimodal assistance, pt with decreased sustained attention of ~ 1 minute, delayed response time of ~ 2 minutes to verbal questions or turn taking, in collaboration with SLP list generated of possible activities to help overcome these barriers to functional communication;  SPEECH INTELLIGIBILITY: moderate verbal cues to increase vocal intensity and slow rate of  speech to achieve ~ 50% speech intelligibility at the sentence level              SLP Education - 01/12/21 0841     Education Details activites to promote cognitive engagement    Person(s) Educated Patient;Spouse    Methods Explanation;Demonstration;Handout;Verbal cues    Comprehension Need further instruction              SLP Short Term Goals - 12/10/20 0801       SLP SHORT TERM GOAL #1   Title Pt will maintain speech volume appropriate for 80% intelligibilty in 3 minutes simple conversation with rare min A.    Baseline < 75%    Time 10    Period --   sessions   Status New      SLP SHORT TERM GOAL #2   Title Pt will use 1 external compensatory memory aid to recall orientation information with minimal assistance.    Baseline not currently using compensatory memory aids    Time 10    Period --   sessions   Status New      SLP SHORT TERM GOAL #3   Title Pt will sustain attention to basic task for 5 minutes with moderate assistance.    Baseline < 2 minutes of sustained attention    Time 10    Period --   sessions   Status New      SLP SHORT TERM GOAL #4   Title --              SLP Long Term Goals - 12/10/20 2202       SLP LONG TERM GOAL #1   Title Pt will use speech intelligibility strategies to achieve ~ 80% speech intelligiblity at the sentence level with intermittent supervision cues.    Baseline <75% speech intelligibility    Time 8    Period Weeks    Status New    Target Date 03/04/21      SLP LONG TERM GOAL #2   Title Patient will identify cognitive-communication barriers and participate in developing functional compensatory strategies.    Baseline new goal    Time 8    Period Weeks    Status New    Target Date 03/04/21      SLP LONG TERM GOAL #3   Title Pt will complete cognitive communication HEP.    Baseline new goal    Time 8    Period Weeks    Status New    Target Date 03/04/21              Plan - 01/12/21 0842      Clinical Impression Statement Pt presents with chronic increased processing time today. As a result, his moderate cognitive communication impairment, that is exacerbated by poor speech intelligibility, was more prevalent during today's session. With SLP assistance, pt and his wife were able to identify several therapeutic  activities to increase pt's cognitive participation in tasks during his day. At this time, pt is unable to express wants/needs in a timely intelligible manner. As such, skilled ST intervention is required to improve pt's functional communication/independence and reduce caregiver burden.    Speech Therapy Frequency 2x / week    Duration 12 weeks    Treatment/Interventions Compensatory techniques;Internal/external aids;Functional tasks;SLP instruction and feedback;Patient/family education    Potential to Achieve Goals Fair    Potential Considerations Ability to learn/carryover information;Medical prognosis;Severity of impairments;Co-morbidities;Previous level of function    SLP Home Exercise Plan provided, see pt instructions section    Consulted and Agree with Plan of Care Patient;Family member/caregiver    Family Member Consulted [name]             Patient will benefit from skilled therapeutic intervention in order to improve the following deficits and impairments:   Cognitive communication deficit  Dysarthria and anarthria  Parkinson's disease ([place])  Dementia associated with Parkinson's disease ([place])    Problem List Patient Active Problem List   Diagnosis Date Noted   Sinus drainage 12/09/2020   Medicare annual wellness visit, subsequent 11/18/2020   Pacemaker    Dementia associated with Parkinson's disease ([place]) 06/17/2020   Vertigo 02/19/2020   Urine abnormality 02/19/2020   Dysphagia 02/23/2019   Fall at home 10/18/2017   REM behavioral disorder 11/02/2016   Radicular pain in right arm 10/09/2016   GERD (gastroesophageal reflux disease) 07/31/2016    Colon cancer screening 07/23/2015   Gout 02/18/2015   Depression 01/06/2015   Skin lesion 10/02/2014   Dupuytren's contracture 08/20/2014   Cough 07/21/2014   Lumbar stenosis with neurogenic claudication 06/13/2014   S/P deep brain stimulator placement 05/08/2014   Advance care planning 01/19/2014   Parkinson's disease ([place]) 12/13/2013   PTSD (post-traumatic stress disorder) 06/13/2013   Erectile dysfunction 06/13/2013   HLD (hyperlipidemia) 06/13/2013   Essential hypertension 06/03/2013   Bradycardia by electrocardiogram 06/03/2013   Obstructive sleep apnea 03/12/2013   Dalasia Predmore B. Rutherford Nail M.S., CCC-SLP, [place] Pathologist Rehabilitation Services Office [phone]  Stormy Fabian 01/12/2021, 8:44 AM  [place] MAIN [facility] SERVICES [address] [place], [place], [address] Phone: [phone]   Fax:  [phone]   Name: [name] MRN: [id number] Date of Birth: 12-19-1950

## 2021-01-13 ENCOUNTER — Ambulatory Visit: Payer: Medicare PPO

## 2021-01-13 ENCOUNTER — Encounter: Payer: Self-pay | Admitting: Physical Therapy

## 2021-01-13 ENCOUNTER — Ambulatory Visit: Payer: Medicare PPO | Admitting: Speech Pathology

## 2021-01-13 ENCOUNTER — Other Ambulatory Visit: Payer: Self-pay

## 2021-01-13 ENCOUNTER — Ambulatory Visit: Payer: Medicare PPO | Admitting: Physical Therapy

## 2021-01-13 DIAGNOSIS — R278 Other lack of coordination: Secondary | ICD-10-CM

## 2021-01-13 DIAGNOSIS — F0280 Dementia in other diseases classified elsewhere without behavioral disturbance: Secondary | ICD-10-CM | POA: Diagnosis not present

## 2021-01-13 DIAGNOSIS — M6281 Muscle weakness (generalized): Secondary | ICD-10-CM

## 2021-01-13 DIAGNOSIS — R41841 Cognitive communication deficit: Secondary | ICD-10-CM

## 2021-01-13 DIAGNOSIS — G20 Parkinson's disease: Secondary | ICD-10-CM | POA: Diagnosis not present

## 2021-01-13 DIAGNOSIS — R262 Difficulty in walking, not elsewhere classified: Secondary | ICD-10-CM

## 2021-01-13 DIAGNOSIS — R296 Repeated falls: Secondary | ICD-10-CM

## 2021-01-13 DIAGNOSIS — R2681 Unsteadiness on feet: Secondary | ICD-10-CM

## 2021-01-13 DIAGNOSIS — R2689 Other abnormalities of gait and mobility: Secondary | ICD-10-CM | POA: Diagnosis not present

## 2021-01-13 NOTE — Therapy (Signed)
[place] MAIN [facility] SERVICES [address] [place], [place], [address] Phone: [phone]   Fax:  [phone]  Physical Therapy Treatment  Patient Details  Name: [name] MRN: [id number] Date of Birth: May 30, 1950 Referring Provider (PT): Elsie Stain, MD   Encounter Date: 01/13/2021   PT End of Session - 01/13/21 1338     Visit Number 9    Number of Visits 24    Date for PT Re-Evaluation 03/08/21    Authorization Type [employer] Medicare Choice PPO    Authorization Time Period recert for 3/53-29/92    Progress Note Due on Visit 10    PT Start Time 1303    PT Stop Time 1344    PT Time Calculation (min) 41 min    Equipment Utilized During Treatment Gait belt    Activity Tolerance Patient tolerated treatment well    Behavior During Therapy [facility] for tasks assessed/performed             Past Medical History:  Diagnosis Date   Arthritis    Bradycardia    Cancer ([place]) 2013   skin cancer   Depression    ptsd   Dysrhythmia    chronic slow heart rate   GERD (gastroesophageal reflux disease)    Headache(784.0)    tension headaches non recent   History of chicken pox    History of kidney stones    passed   Hypertension    treated with HCTZ   Pacemaker    Parkinson's disease ([place])    dx'ed 15 years ago   PTSD (post-traumatic stress disorder)    Shortness of breath dyspnea    Sleep apnea    doesn't use C-pap   Varicose veins     Past Surgical History:  Procedure Laterality Date   CHOLECYSTECTOMY N/A 10/22/2014   Procedure: LAPAROSCOPIC CHOLECYSTECTOMY WITH INTRAOPERATIVE CHOLANGIOGRAM;  Surgeon: Dia Crawford III, MD;  Location: [facility] ORS;  Service: General;  Laterality: N/A;   cyst removed      from lip as a child   INTRAMEDULLARY (IM) NAIL INTERTROCHANTERIC N/A 02/18/2019   Procedure: INTRAMEDULLARY (IM) NAIL INTERTROCHANTRIC, RIGHT,;  Surgeon: Thornton Park, MD;  Location: [facility] ORS;  Service: Orthopedics;  Laterality: N/A;    LUMBAR LAMINECTOMY/DECOMPRESSION MICRODISCECTOMY Bilateral 12/14/2012   Procedure: Bilateral lumbar three-four, four-five decompressive laminotomy/foraminotomy;  Surgeon: Charlie Pitter, MD;  Location: [place] NEURO ORS;  Service: Neurosurgery;  Laterality: Bilateral;   PULSE GENERATOR IMPLANT Bilateral 12/13/2013   Procedure: Bilateral implantable pulse generator placement;  Surgeon: Erline Levine, MD;  Location: [place] NEURO ORS;  Service: Neurosurgery;  Laterality: Bilateral;  Bilateral implantable pulse generator placement   skin cancer removed     from ears,   12 lft arm  rt leg 15   SUBTHALAMIC STIMULATOR BATTERY REPLACEMENT Bilateral 07/14/2017   Procedure: BILATERAL IMPLANTED PULSE GENERATOR CHANGE FOR DEEP BRAIN STIMULATOR;  Surgeon: Erline Levine, MD;  Location: [place];  Service: Neurosurgery;  Laterality: Bilateral;   SUBTHALAMIC STIMULATOR INSERTION Bilateral 12/06/2013   Procedure: SUBTHALAMIC STIMULATOR INSERTION;  Surgeon: Erline Levine, MD;  Location: [place] NEURO ORS;  Service: Neurosurgery;  Laterality: Bilateral;  Bilateral deep brain stimulator placement    There were no vitals filed for this visit.   Subjective Assessment - 01/13/21 1308     Subjective Pt reports back pain following last treatment session. Reports he had to do more walking at the [place]. He reports he had bone density test there but does ont have results  back yet.    Pertinent History [name] is a 50yoM who comes [facility] neuro for evaluation of conitnued difficulty of imbalance, unsteadiness on feet, and generally limited tolerance to mobility in the setting of chronic parkinsonism. SInce DC in February, pt has had PPM placement due to bradycardia issues. Pt also started using an UpWalker device 2 week prior to this evaluation, goal was to help with gradual melting of postural extension that occured with sustained upright actiivty. Pt has continued to practice supervised AMB with caregivers, short distances out of house and  in community. Pt continues to experience frequent falls on a weekly basis, at a rate that both he and wife report to be increasing in frequency. PMH: HTN, bradycardia s/p PPM, PD, DBS, COVID-19 infection, chronic low back pain s/p L4/5 surgery.    Limitations Walking;Standing;Sitting;House hold activities;Lifting    How long can you sit comfortably? Not limited    How long can you stand comfortably? 3-5 minutes with support    How long can you walk comfortably? ~5-6 minutes UpWalker Walker    Patient Stated Goals Reduce falls, improve AMB tolerance    Currently in Pain? Yes    Pain Score 5     Pain Location Back    Pain Orientation Lower    Pain Descriptors / Indicators Stabbing    Pain Type Chronic pain              Treatment provided this session   Neuro Re- Ed:   Seated: PWR UP -Cues for proper arm placement with less abduction in order to improve shoulder extension with power up portion -PWR! Twist -Cues for full horizontal abduction of arms between each repetition -PWR! Rock -cues for increased weight shift superiorly  PWR! step  -cues for larger stp and for speed of step  sequence of the above 2 x 3 (of each alternating to improve sequencing and initiating of various tasks)  Standing  PWR! Up- x 10  -Cues for arm placement to improve extension during power up PWR twist x 10  -Cues for full horizontal abduction between each repetition PWR! Rock x 10  -Tactile cues to reach higher and increase weight shift  PWR! Step x 10  -Cues for full step to the side with the documentation, with sequencing of this task patient tends to leave out stepping portion.  Patient required frequent cues for proper performance of stepping portion during sequence to movements -cues for proper arm hor abd for improved stretch  sequence of the above 2 x 3 (of each alternating to improve sequencing and initiating of various tasks)  Sit to stand PWR! Up 2 x 10, pt unable to perform repetition  3 and descended back to chair on PWR! Up, pt continued exercise following with good efficacy. Similar response on set 2 but still able to complete all repetitions.   Ladder drills : ambulated utilizing ladder x 4 times through with standing rollator -Pt had difficulty with placement of rollator to avoid ladder but was able to incorporate larger steps utilizing ladder for visual cues for larger steps.     Patient did not report any increase in low back pain during any exercises completed in therapy session today.  Pt educated throughout session about proper posture and technique with exercises. Improved exercise technique, movement at target joints, use of target muscles after min to mod verbal, visual, tactile cues.  PT Education - 01/13/21 1421     Education provided Yes    Education Details exs form and technique    Person(s) Educated Patient;Spouse    Methods Explanation;Demonstration;Tactile cues;Verbal cues    Comprehension Verbalized understanding;Returned demonstration;Verbal cues required;Need further instruction              PT Short Term Goals - 01/11/21 1010       PT SHORT TERM GOAL #1   Title Pt to demonstrate improved sustained AMB tolerance >836ft.    Baseline At evlauation: 632ft tolerance (not to failure); 9/26: 1384 ft    Time 4    Period Weeks    Status Achieved    Target Date 01/05/21      PT SHORT TERM GOAL #2   Title Pt to demonstrate [place] >17/28 to show improvement in balance.    Baseline Eval: 15/28; 9/26: deferred    Time 4    Period Weeks    Status On-going    Target Date 02/08/21      PT SHORT TERM GOAL #3   Title Pt to demonstrate 10MWT<13sec to show self-selected gait speed appropriate for AMB outside of house and to improve energy efficiency.    Baseline UpWalker Eval: 17.53sec; 9/26: 0.74 m/s (13.5)    Time 4    Period Weeks    Status Partially Met    Target Date 02/08/21                PT Long Term Goals - 01/11/21 1030       PT LONG TERM GOAL #1   Title Pt to score MiniBesTest > 20/28 to show improved balance.    Baseline Eval: 15/28; 9/26: deferred    Time 8    Period Weeks    Status On-going    Target Date 03/08/21      PT LONG TERM GOAL #2   Title Pt to tolerate 1030ft sustained AMB with UpWalker without rest interval to improve safety and independence in limited community AMB.    Baseline At eval; tired after 624ft; 9/26: 1384 ft    Time 8    Period Weeks    Status Achieved      PT LONG TERM GOAL #3   Title Pt to demonstrate improved FOTO score by 5 points compared to initital assessment.    Baseline 9/26: 49 (eval was 50)    Time 8    Period Weeks    Status On-going    Target Date 03/08/21      PT LONG TERM GOAL #4   Title Pt to show tolerance of AMB>1317ft with UpWalker to improve tolerance to accessing the community for IADL.    Baseline Eval: 650ft;  9/26: 1385    Time 12    Period Weeks    Status Achieved      PT LONG TERM GOAL #5   Title Patient will increase six minute walk test distance to >1000 for progression to community ambulator and improve gait ability    Baseline 9/26: new    Time 8    Period Weeks    Status New    Target Date 03/08/21                   Plan - 01/13/21 1422     Clinical Impression Statement Patient presented to therapy with good motivation to continue therapy program.  Incorporated several Parkinson's wellness recovery exercises and patient was able to complete with some visual  and verbal cues.  Patient had some difficulty with incorporating steps and weight shifting to program with cues he had improved efficacy.  With ambulation with Rollator and ladder patient demonstrated improved ability to take longer steps, however, patient had some difficulty with maneuvering Rollator over a ladder.  Patient will continue to benefit from skilled physical therapy intervention to improve his lower  extremity strength, improve his posture, improve his balance, and prevent future falls.    Personal Factors and Comorbidities Age;Past/Current Experience;Time since onset of injury/illness/exacerbation;Transportation    Comorbidities Chronic back pain, orthostatic hypotension, repeated falls, parkinsonism    Examination-Activity Limitations Bathing;Bed Mobility;Caring for Others;Bend;Dressing;Lift;Locomotion Level;Reach Overhead;Sit;Squat;Stairs;Stand;Toileting;Transfers;Carry    Examination-Participation Restrictions Cleaning;Community Activity;Interpersonal Relationship;Shop;Volunteer;Yard Work;Other;Driving    Stability/Clinical Decision Making Evolving/Moderate complexity    Rehab Potential Fair    PT Frequency 2x / week    PT Duration 12 weeks    PT Treatment/Interventions ADLs/Self Care Home Management;Aquatic Therapy;Cryotherapy;Electrical Stimulation;Iontophoresis 4mg /ml Dexamethasone;Moist Heat;Ultrasound;Contrast Bath;DME Instruction;Gait training;Stair training;Functional mobility training;Therapeutic activities;Therapeutic exercise;Balance training;Neuromuscular re-education;Patient/family education;Manual techniques;Wheelchair mobility training;Energy conservation;Passive range of motion;Traction;Orthotic Fit/Training;Dry needling;Vestibular;Visual/perceptual remediation/compensation;Taping;Canalith Repostioning;Cognitive remediation    PT Next Visit Plan Advanced AMB tolerance, use MiniBesTest to guide balance interventions seated rotation and balance exercises    PT Home Exercise Plan None set up at evaluation, still working on previous HEP from 6 months prior (STS, daily walking); 8/29: issued STS with focus on large amplitude movement at support surface; 9/12: PT provides handout with sit to stand exercise and large amplitude seated trunk rotation exercise.  9/19: reinforced HEP, provided handout for seated postural exercise with UE abduction, and seated exercise with trunk twists and UE  reach, 10x for each, can be performed daily or minimum of 4x/week.    Consulted and Agree with Plan of Care Patient;Family member/caregiver    Family Member Consulted Wife             Patient will benefit from skilled therapeutic intervention in order to improve the following deficits and impairments:  Abnormal gait, Decreased balance, Decreased coordination, Decreased mobility, Postural dysfunction, Decreased strength, Decreased safety awareness, Improper body mechanics, Impaired flexibility, Decreased activity tolerance, Decreased endurance, Decreased knowledge of precautions, Difficulty walking, Pain, Cardiopulmonary status limiting activity, Decreased range of motion, Impaired perceived functional ability, Decreased cognition, Dizziness  Visit Diagnosis: Difficulty walking  Repeated falls  Muscle weakness (generalized)  Unsteadiness on feet  Other lack of coordination     Problem List Patient Active Problem List   Diagnosis Date Noted   Sinus drainage 12/09/2020   Medicare annual wellness visit, subsequent 11/18/2020   Pacemaker    Dementia associated with Parkinson's disease ([place]) 06/17/2020   Vertigo 02/19/2020   Urine abnormality 02/19/2020   Dysphagia 02/23/2019   Fall at home 10/18/2017   REM behavioral disorder 11/02/2016   Radicular pain in right arm 10/09/2016   GERD (gastroesophageal reflux disease) 07/31/2016   Colon cancer screening 07/23/2015   Gout 02/18/2015   Depression 01/06/2015   Skin lesion 10/02/2014   Dupuytren's contracture 08/20/2014   Cough 07/21/2014   Lumbar stenosis with neurogenic claudication 06/13/2014   S/P deep brain stimulator placement 05/08/2014   Advance care planning 01/19/2014   Parkinson's disease ([place]) 12/13/2013   PTSD (post-traumatic stress disorder) 06/13/2013   Erectile dysfunction 06/13/2013   HLD (hyperlipidemia) 06/13/2013   Essential hypertension 06/03/2013   Bradycardia by electrocardiogram 06/03/2013    Obstructive sleep apnea 03/12/2013    Particia Lather, PT 01/13/2021, 4:32 PM  [place] MAIN  [place], [place], [address] Phone: [phone]   Fax:  [phone]  Name: KRUE PETERKA MRN: [id number] Date of Birth: 11-Oct-1950

## 2021-01-13 NOTE — Progress Notes (Signed)
Agree.  Thanks.   Physician Documentation  Your signature is required to indicate approval of the treatment plan as stated above. By signing this report, you are approving the plan of care. Please sign and either send electronically or print and fax the signed copy to the number below. If you approve with modifications, please indicate those in the space provided._______________________________________________________________  Physician Signature: ____[place] Damita Dunnings Date:01/13/21 Time:_____  10:19 AM

## 2021-01-15 NOTE — Patient Instructions (Signed)
Revise cognitive organization template to aid in Bible Study

## 2021-01-15 NOTE — Therapy (Signed)
[place] MAIN [facility] SERVICES [address] [place], [place], [address] Phone: [phone]   Fax:  [phone]  Speech Language Pathology Treatment  Patient Details  Name: [name] MRN: [id number] Date of Birth: May 16, 1950 Referring Provider (SLP): Elsie Stain   Encounter Date: 01/13/2021   End of Session - 01/15/21 0805     Visit Number 7    Number of Visits 17    Date for SLP Re-Evaluation 03/04/21    Authorization Type [employer] Medicare Choice PPO    Authorization Time Period 12/08/2020 thru 03/04/2021    Authorization - Visit Number 7    Progress Note Due on Visit 10    SLP Start Time 1300    SLP Stop Time  1400    SLP Time Calculation (min) 60 min    Activity Tolerance Patient tolerated treatment well             Past Medical History:  Diagnosis Date   Arthritis    Bradycardia    Cancer ([place]) 2013   skin cancer   Depression    ptsd   Dysrhythmia    chronic slow heart rate   GERD (gastroesophageal reflux disease)    Headache(784.0)    tension headaches non recent   History of chicken pox    History of kidney stones    passed   Hypertension    treated with HCTZ   Pacemaker    Parkinson's disease ([place])    dx'ed 15 years ago   PTSD (post-traumatic stress disorder)    Shortness of breath dyspnea    Sleep apnea    doesn't use C-pap   Varicose veins     Past Surgical History:  Procedure Laterality Date   CHOLECYSTECTOMY N/A 10/22/2014   Procedure: LAPAROSCOPIC CHOLECYSTECTOMY WITH INTRAOPERATIVE CHOLANGIOGRAM;  Surgeon: Dia Crawford III, MD;  Location: [facility] ORS;  Service: General;  Laterality: N/A;   cyst removed      from lip as a child   INTRAMEDULLARY (IM) NAIL INTERTROCHANTERIC N/A 02/18/2019   Procedure: INTRAMEDULLARY (IM) NAIL INTERTROCHANTRIC, RIGHT,;  Surgeon: Thornton Park, MD;  Location: [facility] ORS;  Service: Orthopedics;  Laterality: N/A;   LUMBAR LAMINECTOMY/DECOMPRESSION MICRODISCECTOMY Bilateral  12/14/2012   Procedure: Bilateral lumbar three-four, four-five decompressive laminotomy/foraminotomy;  Surgeon: Charlie Pitter, MD;  Location: [place] NEURO ORS;  Service: Neurosurgery;  Laterality: Bilateral;   PULSE GENERATOR IMPLANT Bilateral 12/13/2013   Procedure: Bilateral implantable pulse generator placement;  Surgeon: Erline Levine, MD;  Location: [place] NEURO ORS;  Service: Neurosurgery;  Laterality: Bilateral;  Bilateral implantable pulse generator placement   skin cancer removed     from ears,   12 lft arm  rt leg 15   SUBTHALAMIC STIMULATOR BATTERY REPLACEMENT Bilateral 07/14/2017   Procedure: BILATERAL IMPLANTED PULSE GENERATOR CHANGE FOR DEEP BRAIN STIMULATOR;  Surgeon: Erline Levine, MD;  Location: [place];  Service: Neurosurgery;  Laterality: Bilateral;   SUBTHALAMIC STIMULATOR INSERTION Bilateral 12/06/2013   Procedure: SUBTHALAMIC STIMULATOR INSERTION;  Surgeon: Erline Levine, MD;  Location: [place] NEURO ORS;  Service: Neurosurgery;  Laterality: Bilateral;  Bilateral deep brain stimulator placement    There were no vitals filed for this visit.   Subjective Assessment - 01/15/21 0755     Subjective "I found some stuff on Edison Nasuti"    Patient is accompained by: Family member    Currently in Pain? No/denies                   ADULT SLP  TREATMENT - 01/15/21 0001       Treatment Provided   Treatment provided Cognitive-Linquistic      Cognitive-Linquistic Treatment   Treatment focused on Cognition;Dysarthria;Patient/family/caregiver education    Skilled Treatment COGNITIVE COMMUNICATION: SLP provided a cognitive organizational template comprised of [place] questions for pt to use when studying the Bible; while pt was able to answer the questions listed, the template was not effective in helping pt create cohesive thought; SPEECH INTELLIGIBILITY: despite maximal verbal cues, when reading at the sentence lvel, pt's speech intelligibility is <50% d/t pressed speech patterns and fast rate of speech               SLP Education - 01/15/21 0805     Education Details use of external aids for cognitive organization of Biblical topics    Person(s) Educated Patient;Spouse    Methods Explanation;Demonstration;Verbal cues;Handout    Comprehension Verbalized understanding;Need further instruction              SLP Short Term Goals - 12/10/20 0801       SLP SHORT TERM GOAL #1   Title Pt will maintain speech volume appropriate for 80% intelligibilty in 3 minutes simple conversation with rare min A.    Baseline < 75%    Time 10    Period --   sessions   Status New      SLP SHORT TERM GOAL #2   Title Pt will use 1 external compensatory memory aid to recall orientation information with minimal assistance.    Baseline not currently using compensatory memory aids    Time 10    Period --   sessions   Status New      SLP SHORT TERM GOAL #3   Title Pt will sustain attention to basic task for 5 minutes with moderate assistance.    Baseline < 2 minutes of sustained attention    Time 10    Period --   sessions   Status New      SLP SHORT TERM GOAL #4   Title --              SLP Long Term Goals - 12/10/20 1829       SLP LONG TERM GOAL #1   Title Pt will use speech intelligibility strategies to achieve ~ 80% speech intelligiblity at the sentence level with intermittent supervision cues.    Baseline <75% speech intelligibility    Time 8    Period Weeks    Status New    Target Date 03/04/21      SLP LONG TERM GOAL #2   Title Patient will identify cognitive-communication barriers and participate in developing functional compensatory strategies.    Baseline new goal    Time 8    Period Weeks    Status New    Target Date 03/04/21      SLP LONG TERM GOAL #3   Title Pt will complete cognitive communication HEP.    Baseline new goal    Time 8    Period Weeks    Status New    Target Date 03/04/21              Plan - 01/15/21 0806     Clinical Impression  Statement Pt presents with chronic increased processing time today. As a result, his moderate cognitive communication impairment, that is exacerbated by poor speech intelligibility, was more prevalent during today's session. With SLP assistance, pt and his wife were able to identify several  therapeutic activities to increase pt's cognitive participation in tasks during his day. At this time, pt is unable to express wants/needs in a timely intelligible manner. As such, skilled ST intervention is required to improve pt's functional communication/independence and reduce caregiver burden.    Speech Therapy Frequency 2x / week    Duration 12 weeks    Treatment/Interventions Compensatory techniques;Internal/external aids;Functional tasks;SLP instruction and feedback;Patient/family education    Potential to Achieve Goals Fair    Potential Considerations Ability to learn/carryover information;Medical prognosis;Severity of impairments;Co-morbidities;Previous level of function    SLP Home Exercise Plan provided, see pt instructions section    Consulted and Agree with Plan of Care Patient;Family member/caregiver    Family Member Consulted [name]             Patient will benefit from skilled therapeutic intervention in order to improve the following deficits and impairments:   Cognitive communication deficit  Dementia associated with Parkinson's disease ([place])  Parkinson's disease ([place])    Problem List Patient Active Problem List   Diagnosis Date Noted   Sinus drainage 12/09/2020   Medicare annual wellness visit, subsequent 11/18/2020   Pacemaker    Dementia associated with Parkinson's disease ([place]) 06/17/2020   Vertigo 02/19/2020   Urine abnormality 02/19/2020   Dysphagia 02/23/2019   Fall at home 10/18/2017   REM behavioral disorder 11/02/2016   Radicular pain in right arm 10/09/2016   GERD (gastroesophageal reflux disease) 07/31/2016   Colon cancer screening 07/23/2015   Gout  02/18/2015   Depression 01/06/2015   Skin lesion 10/02/2014   Dupuytren's contracture 08/20/2014   Cough 07/21/2014   Lumbar stenosis with neurogenic claudication 06/13/2014   S/P deep brain stimulator placement 05/08/2014   Advance care planning 01/19/2014   Parkinson's disease ([place]) 12/13/2013   PTSD (post-traumatic stress disorder) 06/13/2013   Erectile dysfunction 06/13/2013   HLD (hyperlipidemia) 06/13/2013   Essential hypertension 06/03/2013   Bradycardia by electrocardiogram 06/03/2013   Obstructive sleep apnea 03/12/2013   Jaken Fregia B. Rutherford Nail M.S., CCC-SLP, [place] Pathologist Rehabilitation Services Office [phone]  Stormy Fabian 01/15/2021, 8:08 AM  [place] MAIN [facility] SERVICES [address] [place], [place], [address] Phone: [phone]   Fax:  [phone]   Name: [name] MRN: [id number] Date of Birth: Jan 05, 1951

## 2021-01-18 ENCOUNTER — Ambulatory Visit (INDEPENDENT_AMBULATORY_CARE_PROVIDER_SITE_OTHER): Payer: Medicare PPO | Admitting: Psychology

## 2021-01-18 ENCOUNTER — Ambulatory Visit: Payer: Medicare PPO | Admitting: Speech Pathology

## 2021-01-18 ENCOUNTER — Ambulatory Visit: Payer: Medicare PPO

## 2021-01-18 DIAGNOSIS — F331 Major depressive disorder, recurrent, moderate: Secondary | ICD-10-CM | POA: Diagnosis not present

## 2021-01-18 DIAGNOSIS — F4323 Adjustment disorder with mixed anxiety and depressed mood: Secondary | ICD-10-CM | POA: Diagnosis not present

## 2021-01-20 ENCOUNTER — Ambulatory Visit: Payer: Medicare PPO | Admitting: Physical Therapy

## 2021-01-20 ENCOUNTER — Ambulatory Visit: Payer: Medicare PPO

## 2021-01-20 ENCOUNTER — Ambulatory Visit: Payer: Medicare PPO | Admitting: Speech Pathology

## 2021-01-20 ENCOUNTER — Other Ambulatory Visit: Payer: Self-pay

## 2021-01-20 ENCOUNTER — Ambulatory Visit: Payer: Medicare PPO | Attending: Family Medicine | Admitting: Physical Therapy

## 2021-01-20 DIAGNOSIS — G20 Parkinson's disease: Secondary | ICD-10-CM | POA: Insufficient documentation

## 2021-01-20 DIAGNOSIS — M6281 Muscle weakness (generalized): Secondary | ICD-10-CM | POA: Insufficient documentation

## 2021-01-20 DIAGNOSIS — R471 Dysarthria and anarthria: Secondary | ICD-10-CM | POA: Insufficient documentation

## 2021-01-20 DIAGNOSIS — R262 Difficulty in walking, not elsewhere classified: Secondary | ICD-10-CM | POA: Insufficient documentation

## 2021-01-20 DIAGNOSIS — G20A1 Parkinson's disease without dyskinesia, without mention of fluctuations: Secondary | ICD-10-CM

## 2021-01-20 DIAGNOSIS — R41841 Cognitive communication deficit: Secondary | ICD-10-CM

## 2021-01-20 DIAGNOSIS — R2681 Unsteadiness on feet: Secondary | ICD-10-CM | POA: Insufficient documentation

## 2021-01-20 DIAGNOSIS — R278 Other lack of coordination: Secondary | ICD-10-CM | POA: Diagnosis not present

## 2021-01-20 DIAGNOSIS — G8929 Other chronic pain: Secondary | ICD-10-CM | POA: Diagnosis present

## 2021-01-20 DIAGNOSIS — R2689 Other abnormalities of gait and mobility: Secondary | ICD-10-CM | POA: Insufficient documentation

## 2021-01-20 DIAGNOSIS — M545 Low back pain, unspecified: Secondary | ICD-10-CM | POA: Insufficient documentation

## 2021-01-20 DIAGNOSIS — F0280 Dementia in other diseases classified elsewhere without behavioral disturbance: Secondary | ICD-10-CM | POA: Diagnosis present

## 2021-01-20 DIAGNOSIS — R269 Unspecified abnormalities of gait and mobility: Secondary | ICD-10-CM | POA: Insufficient documentation

## 2021-01-20 DIAGNOSIS — R296 Repeated falls: Secondary | ICD-10-CM | POA: Insufficient documentation

## 2021-01-20 NOTE — Therapy (Signed)
[place] MAIN [facility] SERVICES [address] [place], [place], [address] Phone: [phone]   Fax:  [phone]  Physical Therapy Treatment/ Physical Therapy Progress Note   Dates of reporting period  12/08/20   to   01/20/21   Patient Details  Name: [name] MRN: [id number] Date of Birth: 01/16/1951 Referring Provider (PT): Elsie Stain, MD   Encounter Date: 01/20/2021   PT End of Session - 01/20/21 1352     Visit Number 10    Number of Visits 24    Date for PT Re-Evaluation 03/08/21    Authorization Type [employer] Medicare Choice PPO    Authorization Time Period recert for 6/97-94/80    Progress Note Due on Visit 10    PT Start Time 1302    PT Stop Time 1345    PT Time Calculation (min) 43 min    Equipment Utilized During Treatment Gait belt    Activity Tolerance Patient tolerated treatment well    Behavior During Therapy [facility] for tasks assessed/performed             Past Medical History:  Diagnosis Date   Arthritis    Bradycardia    Cancer ([place]) 2013   skin cancer   Depression    ptsd   Dysrhythmia    chronic slow heart rate   GERD (gastroesophageal reflux disease)    Headache(784.0)    tension headaches non recent   History of chicken pox    History of kidney stones    passed   Hypertension    treated with HCTZ   Pacemaker    Parkinson's disease ([place])    dx'ed 15 years ago   PTSD (post-traumatic stress disorder)    Shortness of breath dyspnea    Sleep apnea    doesn't use C-pap   Varicose veins     Past Surgical History:  Procedure Laterality Date   CHOLECYSTECTOMY N/A 10/22/2014   Procedure: LAPAROSCOPIC CHOLECYSTECTOMY WITH INTRAOPERATIVE CHOLANGIOGRAM;  Surgeon: Dia Crawford III, MD;  Location: [facility] ORS;  Service: General;  Laterality: N/A;   cyst removed      from lip as a child   INTRAMEDULLARY (IM) NAIL INTERTROCHANTERIC N/A 02/18/2019   Procedure: INTRAMEDULLARY (IM) NAIL INTERTROCHANTRIC, RIGHT,;   Surgeon: Thornton Park, MD;  Location: [facility] ORS;  Service: Orthopedics;  Laterality: N/A;   LUMBAR LAMINECTOMY/DECOMPRESSION MICRODISCECTOMY Bilateral 12/14/2012   Procedure: Bilateral lumbar three-four, four-five decompressive laminotomy/foraminotomy;  Surgeon: Charlie Pitter, MD;  Location: [place] NEURO ORS;  Service: Neurosurgery;  Laterality: Bilateral;   PULSE GENERATOR IMPLANT Bilateral 12/13/2013   Procedure: Bilateral implantable pulse generator placement;  Surgeon: Erline Levine, MD;  Location: [place] NEURO ORS;  Service: Neurosurgery;  Laterality: Bilateral;  Bilateral implantable pulse generator placement   skin cancer removed     from ears,   12 lft arm  rt leg 15   SUBTHALAMIC STIMULATOR BATTERY REPLACEMENT Bilateral 07/14/2017   Procedure: BILATERAL IMPLANTED PULSE GENERATOR CHANGE FOR DEEP BRAIN STIMULATOR;  Surgeon: Erline Levine, MD;  Location: [place];  Service: Neurosurgery;  Laterality: Bilateral;   SUBTHALAMIC STIMULATOR INSERTION Bilateral 12/06/2013   Procedure: SUBTHALAMIC STIMULATOR INSERTION;  Surgeon: Erline Levine, MD;  Location: [place] NEURO ORS;  Service: Neurosurgery;  Laterality: Bilateral;  Bilateral deep brain stimulator placement    There were no vitals filed for this visit.   Subjective Assessment - 01/20/21 1305     Subjective Pt reports no changes since last session. Reports continued low back pain  but no increse in pain following previous treatment session.    Patient is accompained by: Family member    Pertinent History Lyn Deemer is a 26yoM who comes to [facility] neuro for evaluation of conitnued difficulty of imbalance, unsteadiness on feet, and generally limited tolerance to mobility in the setting of chronic parkinsonism. SInce DC in February, pt has had PPM placement due to bradycardia issues. Pt also started using an UpWalker device 2 week prior to this evaluation, goal was to help with gradual melting of postural extension that occured with sustained upright actiivty.  Pt has continued to practice supervised AMB with caregivers, short distances out of house and in community. Pt continues to experience frequent falls on a weekly basis, at a rate that both he and wife report to be increasing in frequency. PMH: HTN, bradycardia s/p PPM, PD, DBS, COVID-19 infection, chronic low back pain s/p L4/5 surgery.    Limitations Walking;Standing;Sitting;House hold activities;Lifting    How long can you sit comfortably? Not limited    How long can you stand comfortably? 3-5 minutes with support    How long can you walk comfortably? ~5-6 minutes UpWalker Walker    Patient Stated Goals Reduce falls, improve AMB tolerance    Currently in Pain? Yes    Pain Score 4     Pain Location Back    Pain Orientation Lower    Pain Type Chronic pain    Pain Onset More than a month ago    Pain Frequency Intermittent             Treatment provided this session     Neuro Re- Ed:    Seated:  -PWR UP x 15 -Cues for proper arm placement with less abduction in order to improve shoulder extension with power up portion -PWR! Twist x 16  -Cues for full horizontal abduction of arms between each repetition -PWR! Rock  x 16  -cues for increased weight shift superiorly  PWR! step x 16  -cues for larger stp and for speed of step  Seated PWR! Flow: sequence of the above 1 x 4 (of each alternating to improve sequencing and initiating of various tasks)   Standing   PWR! Up- x 10  -Cues for arm placement to improve extension during power up -When attempting to stand on first rep pt unable to perform and fell backward into seat. No increase in low back pain reported when asked regarding this.  PWR twist x 10  -Cues for full horizontal abduction between each repetition PWR! Rock x 10  -Tactile cues to reach higher and increase weight shift  PWR! Step x 10  -Patient unable to take step with last several repetitions, contact-guard assist provided for all standing activities today. -Cues  for full step to the side with the documentation, with sequencing of this task patient tends to leave out stepping portion.  Patient required frequent cues for proper performance of stepping portion during sequence to movements Following each of the standing exercise pt required seated rest break for approximately 30sec-42min - significant increase in difficulty with standing exercises today, despite cues pt still unable to complete activities as instructed and CGA was provided to prevent fall or LOB  Sit to stand PWR! Up 2 x 5, pt unable to perform with increased repetitions, needed frequent and significant cues for proper performance of exercises in today's session.   Ambulation around clinic x 1 lap, with Up walker, flexed posture throughout, slow gait pattern, unable to  maintain increased step length   Seated anterior pelvic tilt with scapular retraction x 10  -VC and TC for proper performance   Pt educated throughout session about proper posture and technique with exercises. Improved exercise technique, movement at target joints, use of target muscles after min to mod verbal, visual, tactile cues.  Overall on today's session patient had difficulty completing exercises.  Patient required multimodal cueing with increased frequency today compared to previous sessions despite therapy being similar to previous session where min to mod cueing was all that was necessary.  Pt required occasional rest breaks due fatigue, PT was quick to ask when pt appeared to be fatiguing in order to prevent excessive fatigue.  Unless otherwise stated, CGA was provided and gait belt donned in order to ensure pt safety                      PT Short Term Goals - 01/11/21 1010       PT SHORT TERM GOAL #1   Title Pt to demonstrate improved sustained AMB tolerance >861ft.    Baseline At evlauation: 654ft tolerance (not to failure); 9/26: 1384 ft    Time 4    Period Weeks    Status Achieved     Target Date 01/05/21      PT SHORT TERM GOAL #2   Title Pt to demonstrate [place] >17/28 to show improvement in balance.    Baseline Eval: 15/28; 9/26: deferred    Time 4    Period Weeks    Status On-going    Target Date 02/08/21      PT SHORT TERM GOAL #3   Title Pt to demonstrate 10MWT<13sec to show self-selected gait speed appropriate for AMB outside of house and to improve energy efficiency.    Baseline UpWalker Eval: 17.53sec; 9/26: 0.74 m/s (13.5)    Time 4    Period Weeks    Status Partially Met    Target Date 02/08/21               PT Long Term Goals - 01/11/21 1030       PT LONG TERM GOAL #1   Title Pt to score MiniBesTest > 20/28 to show improved balance.    Baseline Eval: 15/28; 9/26: deferred    Time 8    Period Weeks    Status On-going    Target Date 03/08/21      PT LONG TERM GOAL #2   Title Pt to tolerate 1057ft sustained AMB with UpWalker without rest interval to improve safety and independence in limited community AMB.    Baseline At eval; tired after 69ft; 9/26: 1384 ft    Time 8    Period Weeks    Status Achieved      PT LONG TERM GOAL #3   Title Pt to demonstrate improved FOTO score by 5 points compared to initital assessment.    Baseline 9/26: 49 (eval was 50)    Time 8    Period Weeks    Status On-going    Target Date 03/08/21      PT LONG TERM GOAL #4   Title Pt to show tolerance of AMB>1347ft with UpWalker to improve tolerance to accessing the community for IADL.    Baseline Eval: 648ft;  9/26: 1385    Time 12    Period Weeks    Status Achieved      PT LONG TERM GOAL #5   Title Patient will increase  six minute walk test distance to >1000 for progression to community ambulator and improve gait ability    Baseline 9/26: new    Time 8    Period Weeks    Status New    Target Date 03/08/21                   Plan - 01/20/21 1357     Clinical Impression Statement Patient presents to physical therapy session for  progress note following 10 physical therapy visits.  Pt making gains with activity tolerance/ambulatory ability, ambulating >1300 ft in 9 minutes prior to needing a rest break 2 visits prior. Pt has also showed improvement with 10MWT gait speed compared to evalaution (0.74 m/s today). Pt saw one point increase in FOTO, where pt spouse assisted pt in filling it out, inidcating similar perceived functional mobility and QOL compared to eval. Overall, pt with improved functional capacity and gait ability.  Patient had increased difficulty with some tasks today requiring increased multimodal cueing and decreased efficacy with tasks.  The pt will benefit from further skilled PT to continue to improve mobility, gait, balance and strength to increase ease and safety with ADLs. Patient's condition has the potential to improve in response to therapy. Maximum improvement is yet to be obtained. The anticipated improvement is attainable and reasonable in a generally predictable time.      Personal Factors and Comorbidities Age;Past/Current Experience;Time since onset of injury/illness/exacerbation;Transportation    Comorbidities Chronic back pain, orthostatic hypotension, repeated falls, parkinsonism    Examination-Activity Limitations Bathing;Bed Mobility;Caring for Others;Bend;Dressing;Lift;Locomotion Level;Reach Overhead;Sit;Squat;Stairs;Stand;Toileting;Transfers;Carry    Examination-Participation Restrictions Cleaning;Community Activity;Interpersonal Relationship;Shop;Volunteer;Yard Work;Other;Driving    Stability/Clinical Decision Making Evolving/Moderate complexity    Rehab Potential Fair    PT Frequency 2x / week    PT Duration 12 weeks    PT Treatment/Interventions ADLs/Self Care Home Management;Aquatic Therapy;Cryotherapy;Electrical Stimulation;Iontophoresis 4mg /ml Dexamethasone;Moist Heat;Ultrasound;Contrast Bath;DME Instruction;Gait training;Stair training;Functional mobility training;Therapeutic  activities;Therapeutic exercise;Balance training;Neuromuscular re-education;Patient/family education;Manual techniques;Wheelchair mobility training;Energy conservation;Passive range of motion;Traction;Orthotic Fit/Training;Dry needling;Vestibular;Visual/perceptual remediation/compensation;Taping;Canalith Repostioning;Cognitive remediation    PT Next Visit Plan Advanced AMB tolerance, use MiniBesTest to guide balance interventions seated rotation and balance exercises    PT Home Exercise Plan None set up at evaluation, still working on previous HEP from 6 months prior (STS, daily walking); 8/29: issued STS with focus on large amplitude movement at support surface; 9/12: PT provides handout with sit to stand exercise and large amplitude seated trunk rotation exercise.  9/19: reinforced HEP, provided handout for seated postural exercise with UE abduction, and seated exercise with trunk twists and UE reach, 10x for each, can be performed daily or minimum of 4x/week.    Consulted and Agree with Plan of Care Patient;Family member/caregiver    Family Member Consulted Wife             Patient will benefit from skilled therapeutic intervention in order to improve the following deficits and impairments:  Abnormal gait, Decreased balance, Decreased coordination, Decreased mobility, Postural dysfunction, Decreased strength, Decreased safety awareness, Improper body mechanics, Impaired flexibility, Decreased activity tolerance, Decreased endurance, Decreased knowledge of precautions, Difficulty walking, Pain, Cardiopulmonary status limiting activity, Decreased range of motion, Impaired perceived functional ability, Decreased cognition, Dizziness  Visit Diagnosis: Difficulty walking  Repeated falls  Muscle weakness (generalized)  Other abnormalities of gait and mobility     Problem List Patient Active Problem List   Diagnosis Date Noted   Sinus drainage 12/09/2020   Medicare annual wellness visit,  subsequent 11/18/2020   Pacemaker  Dementia associated with Parkinson's disease ([place]) 06/17/2020   Vertigo 02/19/2020   Urine abnormality 02/19/2020   Dysphagia 02/23/2019   Fall at home 10/18/2017   REM behavioral disorder 11/02/2016   Radicular pain in right arm 10/09/2016   GERD (gastroesophageal reflux disease) 07/31/2016   Colon cancer screening 07/23/2015   Gout 02/18/2015   Depression 01/06/2015   Skin lesion 10/02/2014   Dupuytren's contracture 08/20/2014   Cough 07/21/2014   Lumbar stenosis with neurogenic claudication 06/13/2014   S/P deep brain stimulator placement 05/08/2014   Advance care planning 01/19/2014   Parkinson's disease ([place]) 12/13/2013   PTSD (post-traumatic stress disorder) 06/13/2013   Erectile dysfunction 06/13/2013   HLD (hyperlipidemia) 06/13/2013   Essential hypertension 06/03/2013   Bradycardia by electrocardiogram 06/03/2013   Obstructive sleep apnea 03/12/2013    Particia Lather, PT 01/20/2021, 2:00 PM  [place] MAIN [facility] SERVICES [address] [place], [place], [address] Phone: [phone]   Fax:  [phone]  Name: JAMARRION BUDAI MRN: [id number] Date of Birth: December 06, 1950

## 2021-01-21 NOTE — Patient Instructions (Signed)
Read out loud a small passage of scripture each morning, focus on loudness and slow speech

## 2021-01-21 NOTE — Therapy (Signed)
[place] MAIN [facility] SERVICES [address] [place], [place], [address] Phone: [phone]   Fax:  [phone]  Speech Language Pathology Treatment  Patient Details  Name: [name] MRN: [id number] Date of Birth: 24-Apr-1950 Referring Provider (SLP): Elsie Stain   Encounter Date: 01/20/2021   End of Session - 01/21/21 0847     Visit Number 8    Number of Visits 17    Date for SLP Re-Evaluation 03/04/21    Authorization Type [employer] Medicare Choice PPO    Authorization Time Period 12/08/2020 thru 03/04/2021    Authorization - Visit Number 8    Progress Note Due on Visit 10    SLP Start Time 1345    SLP Stop Time  1445    SLP Time Calculation (min) 60 min    Activity Tolerance Patient tolerated treatment well             Past Medical History:  Diagnosis Date   Arthritis    Bradycardia    Cancer ([place]) 2013   skin cancer   Depression    ptsd   Dysrhythmia    chronic slow heart rate   GERD (gastroesophageal reflux disease)    Headache(784.0)    tension headaches non recent   History of chicken pox    History of kidney stones    passed   Hypertension    treated with HCTZ   Pacemaker    Parkinson's disease ([place])    dx'ed 15 years ago   PTSD (post-traumatic stress disorder)    Shortness of breath dyspnea    Sleep apnea    doesn't use C-pap   Varicose veins     Past Surgical History:  Procedure Laterality Date   CHOLECYSTECTOMY N/A 10/22/2014   Procedure: LAPAROSCOPIC CHOLECYSTECTOMY WITH INTRAOPERATIVE CHOLANGIOGRAM;  Surgeon: Dia Crawford III, MD;  Location: [facility] ORS;  Service: General;  Laterality: N/A;   cyst removed      from lip as a child   INTRAMEDULLARY (IM) NAIL INTERTROCHANTERIC N/A 02/18/2019   Procedure: INTRAMEDULLARY (IM) NAIL INTERTROCHANTRIC, RIGHT,;  Surgeon: Thornton Park, MD;  Location: [facility] ORS;  Service: Orthopedics;  Laterality: N/A;   LUMBAR LAMINECTOMY/DECOMPRESSION MICRODISCECTOMY Bilateral  12/14/2012   Procedure: Bilateral lumbar three-four, four-five decompressive laminotomy/foraminotomy;  Surgeon: Charlie Pitter, MD;  Location: [place] NEURO ORS;  Service: Neurosurgery;  Laterality: Bilateral;   PULSE GENERATOR IMPLANT Bilateral 12/13/2013   Procedure: Bilateral implantable pulse generator placement;  Surgeon: Erline Levine, MD;  Location: [place] NEURO ORS;  Service: Neurosurgery;  Laterality: Bilateral;  Bilateral implantable pulse generator placement   skin cancer removed     from ears,   12 lft arm  rt leg 15   SUBTHALAMIC STIMULATOR BATTERY REPLACEMENT Bilateral 07/14/2017   Procedure: BILATERAL IMPLANTED PULSE GENERATOR CHANGE FOR DEEP BRAIN STIMULATOR;  Surgeon: Erline Levine, MD;  Location: [place];  Service: Neurosurgery;  Laterality: Bilateral;   SUBTHALAMIC STIMULATOR INSERTION Bilateral 12/06/2013   Procedure: SUBTHALAMIC STIMULATOR INSERTION;  Surgeon: Erline Levine, MD;  Location: [place] NEURO ORS;  Service: Neurosurgery;  Laterality: Bilateral;  Bilateral deep brain stimulator placement    There were no vitals filed for this visit.   Subjective Assessment - 01/21/21 0820     Subjective pt with very delayed responses, slow physical movements, continues to be experiencing pain    Patient is accompained by: Family member    Currently in Pain? No/denies  ADULT SLP TREATMENT - 01/21/21 0001       Treatment Provided   Treatment provided Cognitive-Linquistic      Cognitive-Linquistic Treatment   Treatment focused on Cognition;Dysarthria;Patient/family/caregiver education    Skilled Treatment COGNITIVE COMMUNICATION: SPEECH INTELLIGIBILITY: despite maximal verbal cues, when reading at the sentence level, pt's speech intelligibility is <50% d/t pressed speech patterns and fast rate of speech; cues ineffective in increasing speech intelligibility; pt with very delayed response times > 1 minute when conversing - environmental distractions were reduced and SLP  provided more verbal support but pt not able to reduce response times              SLP Education - 01/21/21 0846     Education Details activities to incorperate into each day to promote cognitive communication function    Person(s) Educated Patient;Spouse    Methods Explanation;Demonstration;Verbal cues;Handout    Comprehension Verbalized understanding;Need further instruction              SLP Short Term Goals - 12/10/20 0801       SLP SHORT TERM GOAL #1   Title Pt will maintain speech volume appropriate for 80% intelligibilty in 3 minutes simple conversation with rare min A.    Baseline < 75%    Time 10    Period --   sessions   Status New      SLP SHORT TERM GOAL #2   Title Pt will use 1 external compensatory memory aid to recall orientation information with minimal assistance.    Baseline not currently using compensatory memory aids    Time 10    Period --   sessions   Status New      SLP SHORT TERM GOAL #3   Title Pt will sustain attention to basic task for 5 minutes with moderate assistance.    Baseline < 2 minutes of sustained attention    Time 10    Period --   sessions   Status New      SLP SHORT TERM GOAL #4   Title --              SLP Long Term Goals - 12/10/20 5176       SLP LONG TERM GOAL #1   Title Pt will use speech intelligibility strategies to achieve ~ 80% speech intelligiblity at the sentence level with intermittent supervision cues.    Baseline <75% speech intelligibility    Time 8    Period Weeks    Status New    Target Date 03/04/21      SLP LONG TERM GOAL #2   Title Patient will identify cognitive-communication barriers and participate in developing functional compensatory strategies.    Baseline new goal    Time 8    Period Weeks    Status New    Target Date 03/04/21      SLP LONG TERM GOAL #3   Title Pt will complete cognitive communication HEP.    Baseline new goal    Time 8    Period Weeks    Status New    Target  Date 03/04/21              Plan - 01/21/21 0847     Clinical Impression Statement Pt presents with chronic increased processing time today. As a result, his moderate cognitive communication impairment, that is exacerbated by poor speech intelligibility, continues to be prevalent. Pt is always receptive to SLP cues/information but unfortunately, pt is making very  little progress. Pt and his wife continue to benefit from skilled ST intervention for compensatory straegies to help both adapt to progressive decline in function.    Speech Therapy Frequency 2x / week    Duration 12 weeks    Treatment/Interventions Compensatory techniques;Internal/external aids;Functional tasks;SLP instruction and feedback;Patient/family education    Potential to Achieve Goals Poor    Potential Considerations Ability to learn/carryover information;Medical prognosis;Severity of impairments;Co-morbidities;Previous level of function    SLP Home Exercise Plan provided, see pt instructions section    Consulted and Agree with Plan of Care Patient;Family member/caregiver    Family Member Consulted [name]             Patient will benefit from skilled therapeutic intervention in order to improve the following deficits and impairments:   Cognitive communication deficit  Dementia associated with Parkinson's disease ([place])  Parkinson's disease ([place])    Problem List Patient Active Problem List   Diagnosis Date Noted   Sinus drainage 12/09/2020   Medicare annual wellness visit, subsequent 11/18/2020   Pacemaker    Dementia associated with Parkinson's disease ([employer]) 06/17/2020   Vertigo 02/19/2020   Urine abnormality 02/19/2020   Dysphagia 02/23/2019   Fall at home 10/18/2017   REM behavioral disorder 11/02/2016   Radicular pain in right arm 10/09/2016   GERD (gastroesophageal reflux disease) 07/31/2016   Colon cancer screening 07/23/2015   Gout 02/18/2015   Depression 01/06/2015   Skin lesion  10/02/2014   Dupuytren's contracture 08/20/2014   Cough 07/21/2014   Lumbar stenosis with neurogenic claudication 06/13/2014   S/P deep brain stimulator placement 05/08/2014   Advance care planning 01/19/2014   Parkinson's disease ([place]) 12/13/2013   PTSD (post-traumatic stress disorder) 06/13/2013   Erectile dysfunction 06/13/2013   HLD (hyperlipidemia) 06/13/2013   Essential hypertension 06/03/2013   Bradycardia by electrocardiogram 06/03/2013   Obstructive sleep apnea 03/12/2013   Silvester Reierson B. Rutherford Nail M.S., CCC-SLP, [place] Pathologist Rehabilitation Services Office [phone]  Stormy Fabian 01/21/2021, 8:51 AM  [place] MAIN [facility] SERVICES [address] [place], [place], [address] Phone: [phone]   Fax:  [phone]   Name: NATURE VOGELSANG MRN: [id number] Date of Birth: Nov 12, 1950

## 2021-01-25 ENCOUNTER — Other Ambulatory Visit: Payer: Self-pay

## 2021-01-25 ENCOUNTER — Ambulatory Visit: Payer: Medicare PPO

## 2021-01-25 ENCOUNTER — Ambulatory Visit: Payer: Medicare PPO | Admitting: Speech Pathology

## 2021-01-25 DIAGNOSIS — R471 Dysarthria and anarthria: Secondary | ICD-10-CM | POA: Diagnosis not present

## 2021-01-25 DIAGNOSIS — R278 Other lack of coordination: Secondary | ICD-10-CM | POA: Diagnosis not present

## 2021-01-25 DIAGNOSIS — F0280 Dementia in other diseases classified elsewhere without behavioral disturbance: Secondary | ICD-10-CM

## 2021-01-25 DIAGNOSIS — R2689 Other abnormalities of gait and mobility: Secondary | ICD-10-CM | POA: Diagnosis not present

## 2021-01-25 DIAGNOSIS — G20 Parkinson's disease: Secondary | ICD-10-CM | POA: Diagnosis not present

## 2021-01-25 DIAGNOSIS — R2681 Unsteadiness on feet: Secondary | ICD-10-CM

## 2021-01-25 DIAGNOSIS — M6281 Muscle weakness (generalized): Secondary | ICD-10-CM | POA: Diagnosis not present

## 2021-01-25 DIAGNOSIS — R41841 Cognitive communication deficit: Secondary | ICD-10-CM

## 2021-01-25 DIAGNOSIS — R269 Unspecified abnormalities of gait and mobility: Secondary | ICD-10-CM | POA: Diagnosis not present

## 2021-01-25 DIAGNOSIS — R296 Repeated falls: Secondary | ICD-10-CM | POA: Diagnosis not present

## 2021-01-25 DIAGNOSIS — R262 Difficulty in walking, not elsewhere classified: Secondary | ICD-10-CM | POA: Diagnosis not present

## 2021-01-25 NOTE — Therapy (Signed)
[place] MAIN [facility] SERVICES [address] [place], [place], [address] Phone: [phone]   Fax:  [phone]  Physical Therapy Treatment  Patient Details  Name: [name] MRN: [id number] Date of Birth: 12-19-1950 Referring Provider (PT): Elsie Stain, MD   Encounter Date: 01/25/2021   PT End of Session - 01/25/21 1052     Visit Number 11    Number of Visits 24    Date for PT Re-Evaluation 03/08/21    Authorization Type [employer] Medicare Choice PPO    Authorization Time Period recert for 9/03-83/33    Progress Note Due on Visit 10    PT Start Time 1003    PT Stop Time 1049    PT Time Calculation (min) 46 min    Equipment Utilized During Treatment Gait belt    Activity Tolerance Patient tolerated treatment well    Behavior During Therapy WFL for tasks assessed/performed             Past Medical History:  Diagnosis Date   Arthritis    Bradycardia    Cancer ([place]) 2013   skin cancer   Depression    ptsd   Dysrhythmia    chronic slow heart rate   GERD (gastroesophageal reflux disease)    Headache(784.0)    tension headaches non recent   History of chicken pox    History of kidney stones    passed   Hypertension    treated with HCTZ   Pacemaker    Parkinson's disease ([place])    dx'ed 15 years ago   PTSD (post-traumatic stress disorder)    Shortness of breath dyspnea    Sleep apnea    doesn't use C-pap   Varicose veins     Past Surgical History:  Procedure Laterality Date   CHOLECYSTECTOMY N/A 10/22/2014   Procedure: LAPAROSCOPIC CHOLECYSTECTOMY WITH INTRAOPERATIVE CHOLANGIOGRAM;  Surgeon: Dia Crawford III, MD;  Location: [facility] ORS;  Service: General;  Laterality: N/A;   cyst removed      from lip as a child   INTRAMEDULLARY (IM) NAIL INTERTROCHANTERIC N/A 02/18/2019   Procedure: INTRAMEDULLARY (IM) NAIL INTERTROCHANTRIC, RIGHT,;  Surgeon: Thornton Park, MD;  Location: [facility] ORS;  Service: Orthopedics;  Laterality:  N/A;   LUMBAR LAMINECTOMY/DECOMPRESSION MICRODISCECTOMY Bilateral 12/14/2012   Procedure: Bilateral lumbar three-four, four-five decompressive laminotomy/foraminotomy;  Surgeon: Charlie Pitter, MD;  Location: [place] NEURO ORS;  Service: Neurosurgery;  Laterality: Bilateral;   PULSE GENERATOR IMPLANT Bilateral 12/13/2013   Procedure: Bilateral implantable pulse generator placement;  Surgeon: Erline Levine, MD;  Location: [place] [facility]UR[facility] ORS;  Service: Neurosurgery;  Laterality: Bilateral;  Bilateral implantable pulse generator placement   skin cancer removed     from ears,   12 lft arm  rt leg 15   SUBTHALAMIC STIMULATOR BATTERY REPLACEMENT Bilateral 07/14/2017   Procedure: BILATERAL IMPLANTED PULSE GENERATOR CHANGE FOR DEEP BRAIN STIMULATOR;  Surgeon: Erline Levine, MD;  Location: [place];  Service: Neurosurgery;  Laterality: Bilateral;   SUBTHALAMIC STIMULATOR INSERTION Bilateral 12/06/2013   Procedure: SUBTHALAMIC STIMULATOR INSERTION;  Surgeon: Erline Levine, MD;  Location: [place] NEURO ORS;  Service: Neurosurgery;  Laterality: Bilateral;  Bilateral deep brain stimulator placement    There were no vitals filed for this visit.   Subjective Assessment - 01/25/21 1003     Subjective Pt reports a little back pain. Pt reports no falls.    Patient is accompained by: Family member    Pertinent History [name] is a 38yoM who comes  to [facility] neuro for evaluation of conitnued difficulty of imbalance, unsteadiness on feet, and generally limited tolerance to mobility in the setting of chronic parkinsonism. SInce DC in February, pt has had PPM placement due to bradycardia issues. Pt also started using an UpWalker device 2 week prior to this evaluation, goal was to help with gradual melting of postural extension that occured with sustained upright actiivty. Pt has continued to practice supervised AMB with caregivers, short distances out of house and in community. Pt continues to experience frequent falls on a weekly basis,  at a rate that both he and wife report to be increasing in frequency. PMH: HTN, bradycardia s/p PPM, PD, DBS, COVID-19 infection, chronic low back pain s/p L4/5 surgery.    Limitations Walking;Standing;Sitting;House hold activities;Lifting    How long can you sit comfortably? Not limited    How long can you stand comfortably? 3-5 minutes with support    How long can you walk comfortably? ~5-6 minutes UpWalker Walker    Patient Stated Goals Reduce falls, improve AMB tolerance    Currently in Pain? Yes    Pain Location Back    Pain Orientation Lower    Pain Onset More than a month ago              INTERVENTIONS-    Neuro Re-education: Continued emphasis on motor control, standing large amplitude movement, rotation   The patient donned 1.5 pound weights on bilateral upper extremities to perform all of the following interventions   Seated:  Lean forward and bend followed by big sit up and upper extremity abduction 10x rates easy.  -- Next patient performed big reach down and reach up followed by shoulder abduction 10 times with 5 sec hold for each rep. Cuing for fiber abduction.   Seated large amplitude twist with UE reach across and clap- 12x each direction; TC added for technique   Isolated seated trunk twists with arms across chest 12x each direction  Chair in front of patient with UE support, pt takes side step with reach 10x each direction. PT provides hands on assist/TC to promote ROM.   Chair placed in front of patient, patient holds onto a chair with 1 upper extremity for support, and patient standing with wide base of support.  Patient weight shifts to 1 side with big reach on same side  -12x each direction; patient rates medium    Chair placed in front of patient for support, one UE support on chair, pt performs side weight shift with reach 12x; PT provides hands-on assist to promote increased ROM.   Standing, one UE support on chair, patient reaches forward with other  upper extremity and goes into a mini squat then reaches out to side upon standing - 10x each side.  Continued cuing for large amplitude movement and hand flicks; * pt does report some increased back symptoms with exercise.   Standing, UUE support on chair, pt then takes big step forward with big reach forward (lifts heel with forward reach, lifts toes with backward reach); 15x R, 10x on L. Frequent hands-on assist to R side to promote increased ROM.  STS from standard height chair, continued emphasis on large amplitude movement, 5x, 5x, 2x with forward reach/hand flicks.  PT continues to cue patient for upright posture, focus on hip and knee extension. Patient rates medium. Use of mirror to promote learning of exercise technique/ as a form of visual cuing.    Gait training-patient ambulates for  5 laps  of 148 feet around PT clinic with upwalker to promote equal and increased step-length, improved sequencing.  PT also pulls slightly forward on walker to promote increased gait speed and increased step length for 4 laps, cuing to continue to increase gait speed.  Patient does exhibit improvement with this technique.   Pt educated throughout session about proper posture and technique with exercises. Improved exercise technique, movement at target joints, use of target muscles after min to mod verbal, visual, tactile cues.    PT Education - 01/25/21 1051     Education provided Yes    Education Details body/gait mechanics, exercise technique    Person(s) Educated Patient    Methods Explanation;Demonstration;Tactile cues;Verbal cues    Comprehension Verbalized understanding;Returned demonstration;Tactile cues required;Verbal cues required;Need further instruction              PT Short Term Goals - 01/11/21 1010       PT SHORT TERM GOAL #1   Title Pt to demonstrate improved sustained AMB tolerance >88ft.    Baseline At evlauation: 632ft tolerance (not to failure); 9/26: 1384 ft    Time 4     Period Weeks    Status Achieved    Target Date 01/05/21      PT SHORT TERM GOAL #2   Title Pt to demonstrate [place] >17/28 to show improvement in balance.    Baseline Eval: 15/28; 9/26: deferred    Time 4    Period Weeks    Status On-going    Target Date 02/08/21      PT SHORT TERM GOAL #3   Title Pt to demonstrate 10MWT<13sec to show self-selected gait speed appropriate for AMB outside of house and to improve energy efficiency.    Baseline UpWalker Eval: 17.53sec; 9/26: 0.74 m/s (13.5)    Time 4    Period Weeks    Status Partially Met    Target Date 02/08/21               PT Long Term Goals - 01/11/21 1030       PT LONG TERM GOAL #1   Title Pt to score MiniBesTest > 20/28 to show improved balance.    Baseline Eval: 15/28; 9/26: deferred    Time 8    Period Weeks    Status On-going    Target Date 03/08/21      PT LONG TERM GOAL #2   Title Pt to tolerate 1019ft sustained AMB with UpWalker without rest interval to improve safety and independence in limited community AMB.    Baseline At eval; tired after 636ft; 9/26: 1384 ft    Time 8    Period Weeks    Status Achieved      PT LONG TERM GOAL #3   Title Pt to demonstrate improved FOTO score by 5 points compared to initital assessment.    Baseline 9/26: 49 (eval was 50)    Time 8    Period Weeks    Status On-going    Target Date 03/08/21      PT LONG TERM GOAL #4   Title Pt to show tolerance of AMB>1348ft with UpWalker to improve tolerance to accessing the community for IADL.    Baseline Eval: 647ft;  9/26: 1385    Time 12    Period Weeks    Status Achieved      PT LONG TERM GOAL #5   Title Patient will increase six minute walk test distance to >1000 for progression to community  ambulator and improve gait ability    Baseline 9/26: new    Time 8    Period Weeks    Status New    Target Date 03/08/21                   Plan - 01/25/21 1052     Clinical Impression Statement Pt highly  motivated throughout session. Pt requires close CGA for all standing and gait interventions d/t postural instability. Pt also utilizes UE support to steady self. PT also provides frequent TC/hands-on assist with interventions that involve shoulder ext/flex/abduction to improve ROM. With gait training the pt responds well to PT providing forward pull on walker to promote increased gait speed and step-length. The pt will benefit from further skilled PT to improve mobility, strength, gait, and balance to increase ease and safety with ADLs.    Personal Factors and Comorbidities Age;Past/Current Experience;Time since onset of injury/illness/exacerbation;Transportation    Comorbidities Chronic back pain, orthostatic hypotension, repeated falls, parkinsonism    Examination-Activity Limitations Bathing;Bed Mobility;Caring for Others;Bend;Dressing;Lift;Locomotion Level;Reach Overhead;Sit;Squat;Stairs;Stand;Toileting;Transfers;Carry    Examination-Participation Restrictions Cleaning;Community Activity;Interpersonal Relationship;Shop;Volunteer;Yard Work;Other;Driving    Stability/Clinical Decision Making Evolving/Moderate complexity    Rehab Potential Fair    PT Frequency 2x / week    PT Duration 12 weeks    PT Treatment/Interventions ADLs/Self Care Home Management;Aquatic Therapy;Cryotherapy;Electrical Stimulation;Iontophoresis 4mg /ml Dexamethasone;Moist Heat;Ultrasound;Contrast Bath;DME Instruction;Gait training;Stair training;Functional mobility training;Therapeutic activities;Therapeutic exercise;Balance training;Neuromuscular re-education;Patient/family education;Manual techniques;Wheelchair mobility training;Energy conservation;Passive range of motion;Traction;Orthotic Fit/Training;Dry needling;Vestibular;Visual/perceptual remediation/compensation;Taping;Canalith Repostioning;Cognitive remediation    PT Next Visit Plan Advanced AMB tolerance, use MiniBesTest to guide balance interventions seated rotation and  balance exercises    PT Home Exercise Plan None set up at evaluation, still working on previous HEP from 6 months prior (STS, daily walking); 8/29: issued STS with focus on large amplitude movement at support surface; 9/12: PT provides handout with sit to stand exercise and large amplitude seated trunk rotation exercise.  9/19: reinforced HEP, provided handout for seated postural exercise with UE abduction, and seated exercise with trunk twists and UE reach, 10x for each, can be performed daily or minimum of 4x/week.    Consulted and Agree with Plan of Care Patient;Family member/caregiver    Family Member Consulted Wife             Patient will benefit from skilled therapeutic intervention in order to improve the following deficits and impairments:  Abnormal gait, Decreased balance, Decreased coordination, Decreased mobility, Postural dysfunction, Decreased strength, Decreased safety awareness, Improper body mechanics, Impaired flexibility, Decreased activity tolerance, Decreased endurance, Decreased knowledge of precautions, Difficulty walking, Pain, Cardiopulmonary status limiting activity, Decreased range of motion, Impaired perceived functional ability, Decreased cognition, Dizziness  Visit Diagnosis: Other abnormalities of gait and mobility  Unsteadiness on feet  Muscle weakness (generalized)  Other lack of coordination  Parkinson's disease ([place])     Problem List Patient Active Problem List   Diagnosis Date Noted   Sinus drainage 12/09/2020   Medicare annual wellness visit, subsequent 11/18/2020   Pacemaker    Dementia associated with Parkinson's disease ([place]) 06/17/2020   Vertigo 02/19/2020   Urine abnormality 02/19/2020   Dysphagia 02/23/2019   Fall at home 10/18/2017   REM behavioral disorder 11/02/2016   Radicular pain in right arm 10/09/2016   GERD (gastroesophageal reflux disease) 07/31/2016   Colon cancer screening 07/23/2015   Gout 02/18/2015   Depression  01/06/2015   Skin lesion 10/02/2014   Dupuytren's contracture 08/20/2014   Cough 07/21/2014   Lumbar stenosis with  neurogenic claudication 06/13/2014   S/P deep brain stimulator placement 05/08/2014   Advance care planning 01/19/2014   Parkinson's disease ([place]) 12/13/2013   PTSD (post-traumatic stress disorder) 06/13/2013   Erectile dysfunction 06/13/2013   HLD (hyperlipidemia) 06/13/2013   Essential hypertension 06/03/2013   Bradycardia by electrocardiogram 06/03/2013   Obstructive sleep apnea 03/12/2013    Zollie Pee, PT 01/25/2021, 11:00 AM  [place] MAIN [facility] SERVICES [address] [place], [place], [address] Phone: [phone]   Fax:  [phone]  Name: [name] MRN: [id number] Date of Birth: Jul 06, 1950

## 2021-01-26 NOTE — Therapy (Signed)
[place] MAIN [facility] SERVICES [address] [place], [place], [address] Phone: [phone]   Fax:  [phone]  Speech Language Pathology Treatment  Patient Details  Name: [name] MRN: [id number] Date of Birth: 07-22-1950 Referring Provider (SLP): Elsie Stain   Encounter Date: 01/25/2021   End of Session - 01/26/21 0850     Visit Number 9    Number of Visits 17    Date for SLP Re-Evaluation 03/04/21    Authorization Type [employer] Medicare Choice PPO    Authorization Time Period 12/08/2020 thru 03/04/2021    Authorization - Visit Number 9    Progress Note Due on Visit 10    SLP Start Time 0905    SLP Stop Time  1000    SLP Time Calculation (min) 55 min    Activity Tolerance Patient tolerated treatment well             Past Medical History:  Diagnosis Date   Arthritis    Bradycardia    Cancer ([place]) 2013   skin cancer   Depression    ptsd   Dysrhythmia    chronic slow heart rate   GERD (gastroesophageal reflux disease)    Headache(784.0)    tension headaches non recent   History of chicken pox    History of kidney stones    passed   Hypertension    treated with HCTZ   Pacemaker    Parkinson's disease ([place])    dx'ed 15 years ago   PTSD (post-traumatic stress disorder)    Shortness of breath dyspnea    Sleep apnea    doesn't use C-pap   Varicose veins     Past Surgical History:  Procedure Laterality Date   CHOLECYSTECTOMY N/A 10/22/2014   Procedure: LAPAROSCOPIC CHOLECYSTECTOMY WITH INTRAOPERATIVE CHOLANGIOGRAM;  Surgeon: Dia Crawford III, MD;  Location: [facility] ORS;  Service: General;  Laterality: N/A;   cyst removed      from lip as a child   INTRAMEDULLARY (IM) Mcgee INTERTROCHANTERIC N/A 02/18/2019   Procedure: INTRAMEDULLARY (IM) Mcgee INTERTROCHANTRIC, RIGHT,;  Surgeon: Thornton Park, MD;  Location: [facility] ORS;  Service: Orthopedics;  Laterality: N/A;   LUMBAR LAMINECTOMY/DECOMPRESSION MICRODISCECTOMY Bilateral  12/14/2012   Procedure: Bilateral lumbar three-four, four-five decompressive laminotomy/foraminotomy;  Surgeon: Charlie Pitter, MD;  Location: [employer] [facility] NEURO ORS;  Service: Neurosurgery;  Laterality: Bilateral;   PULSE GENERATOR IMPLANT Bilateral 12/13/2013   Procedure: Bilateral implantable pulse generator placement;  Surgeon: Erline Levine, MD;  Location: [place] NEURO ORS;  Service: Neurosurgery;  Laterality: Bilateral;  Bilateral implantable pulse generator placement   skin cancer removed     from ears,   12 lft arm  rt leg 15   SUBTHALAMIC STIMULATOR BATTERY REPLACEMENT Bilateral 07/14/2017   Procedure: BILATERAL IMPLANTED PULSE GENERATOR CHANGE FOR DEEP BRAIN STIMULATOR;  Surgeon: Erline Levine, MD;  Location: [place];  Service: Neurosurgery;  Laterality: Bilateral;   SUBTHALAMIC STIMULATOR INSERTION Bilateral 12/06/2013   Procedure: SUBTHALAMIC STIMULATOR INSERTION;  Surgeon: Erline Levine, MD;  Location: [place] NEURO ORS;  Service: Neurosurgery;  Laterality: Bilateral;  Bilateral deep brain stimulator placement    There were no vitals filed for this visit.   Subjective Assessment - 01/26/21 0847     Subjective "I rededicated my life in church yesterday. I gave my testimony and related it to [employer]."    Patient is accompained by: Family member    Currently in Pain? No/denies  ADULT SLP TREATMENT - 01/26/21 0001       Treatment Provided   Treatment provided Cognitive-Linquistic      Cognitive-Linquistic Treatment   Treatment focused on Cognition;Dysarthria;Patient/family/caregiver education    Skilled Treatment COGNITIVE COMMUNICATION: reviewed previously introduced cognitive communication strategies that focused on increasing pt's cognitive activity throughout his day; 1.) use of CALENDAR - wife updated monthly calendar with SLP recommendation to place calendar beside pt's Bible for daily review of orientation information; 2.) READING ALOUD - pt has been reading  short passage of scripture out loud using slow rate and increased vocal intensity "most days," 3.) DICTATING poetry - wife reports pt's is extremely distracted by her writing down what he says, recommendation made for wife to sit across table from pt so that he is not able to watch her write; PROBLEM SOLVING - during session pt required maximal cues to problem solve and reduce processing times while playing game of checkers              SLP Education - 01/26/21 0849     Education Details provided    Person(s) Educated Patient;Spouse    Methods Explanation    Comprehension Verbalized understanding;Need further instruction              SLP Short Term Goals - 12/10/20 0801       SLP SHORT TERM GOAL #1   Title Pt will maintain speech volume appropriate for 80% intelligibilty in 3 minutes simple conversation with rare min A.    Baseline < 75%    Time 10    Period --   sessions   Status New      SLP SHORT TERM GOAL #2   Title Pt will use 1 external compensatory memory aid to recall orientation information with minimal assistance.    Baseline not currently using compensatory memory aids    Time 10    Period --   sessions   Status New      SLP SHORT TERM GOAL #3   Title Pt will sustain attention to basic task for 5 minutes with moderate assistance.    Baseline < 2 minutes of sustained attention    Time 10    Period --   sessions   Status New      SLP SHORT TERM GOAL #4   Title --              SLP Long Term Goals - 12/10/20 0762       SLP LONG TERM GOAL #1   Title Pt will use speech intelligibility strategies to achieve ~ 80% speech intelligiblity at the sentence level with intermittent supervision cues.    Baseline <75% speech intelligibility    Time 8    Period Weeks    Status New    Target Date 03/04/21      SLP LONG TERM GOAL #2   Title Patient will identify cognitive-communication barriers and participate in developing functional compensatory strategies.     Baseline new goal    Time 8    Period Weeks    Status New    Target Date 03/04/21      SLP LONG TERM GOAL #3   Title Pt will complete cognitive communication HEP.    Baseline new goal    Time 8    Period Weeks    Status New    Target Date 03/04/21              Plan - 01/26/21  0857     Clinical Impression Statement Pt and wife report pt gave his testimony in church, speech intelligibility was good, slow rate, increased vocal intensity, decreased processing times note. Pt organized his thoughts, wife wrote down the 3 points that he wanted to make [name], [name]) and used this written information to help him be attentive to topic. Education provided on ways to use cognitive communication strategies to promote cognitive function within daily living.Pt and his wife continue to benefit from skilled ST intervention for compensatory straegies to help both adapt to progressive decline in function.    Speech Therapy Frequency 2x / week    Duration 8 weeks    Treatment/Interventions Compensatory techniques;Internal/external aids;Functional tasks;SLP instruction and feedback;Patient/family education    Potential to Achieve Goals Poor    Potential Considerations Ability to learn/carryover information;Medical prognosis;Severity of impairments;Co-morbidities;Previous level of function    SLP Home Exercise Plan provided, see pt instructions section    Consulted and Agree with Plan of Care Patient;Family member/caregiver    Family Member Consulted [name]             Patient will benefit from skilled therapeutic intervention in order to improve the following deficits and impairments:   Cognitive communication deficit  Parkinson's disease ([place])  Dementia associated with Parkinson's disease ([place])    Problem List Patient Active Problem List   Diagnosis Date Noted   Sinus drainage 12/09/2020   Medicare annual wellness visit, subsequent 11/18/2020   Pacemaker    Dementia  associated with Parkinson's disease ([place]) 06/17/2020   Vertigo 02/19/2020   Urine abnormality 02/19/2020   Dysphagia 02/23/2019   Fall at home 10/18/2017   REM behavioral disorder 11/02/2016   Radicular pain in right arm 10/09/2016   GERD (gastroesophageal reflux disease) 07/31/2016   Colon cancer screening 07/23/2015   Gout 02/18/2015   Depression 01/06/2015   Skin lesion 10/02/2014   Dupuytren's contracture 08/20/2014   Cough 07/21/2014   Lumbar stenosis with neurogenic claudication 06/13/2014   S/P deep brain stimulator placement 05/08/2014   Advance care planning 01/19/2014   Parkinson's disease ([place]) 12/13/2013   PTSD (post-traumatic stress disorder) 06/13/2013   Erectile dysfunction 06/13/2013   HLD (hyperlipidemia) 06/13/2013   Essential hypertension 06/03/2013   Bradycardia by electrocardiogram 06/03/2013   Obstructive sleep apnea 03/12/2013   [name] M.S., CCC-SLP, [place] Pathologist Rehabilitation Services Office [phone]  [name] 01/26/2021, 9:03 AM  [place] MAIN [facility] SERVICES [address] [place], [place], [address] Phone: [phone]   Fax:  [phone]   Name: [name] MRN: [id number] Date of Birth: Mar 25, 1951

## 2021-01-26 NOTE — Patient Instructions (Signed)
Using a good quality loud voice, slowly read a bible passage every day.  Continue to refer to calendar every morning for orientation Record and dictate poetry

## 2021-01-27 ENCOUNTER — Ambulatory Visit: Payer: Medicare PPO

## 2021-01-27 ENCOUNTER — Ambulatory Visit: Payer: Medicare PPO | Admitting: Speech Pathology

## 2021-01-27 ENCOUNTER — Other Ambulatory Visit: Payer: Self-pay

## 2021-01-27 DIAGNOSIS — R471 Dysarthria and anarthria: Secondary | ICD-10-CM | POA: Diagnosis not present

## 2021-01-27 DIAGNOSIS — R296 Repeated falls: Secondary | ICD-10-CM | POA: Diagnosis not present

## 2021-01-27 DIAGNOSIS — R262 Difficulty in walking, not elsewhere classified: Secondary | ICD-10-CM | POA: Diagnosis not present

## 2021-01-27 DIAGNOSIS — R2689 Other abnormalities of gait and mobility: Secondary | ICD-10-CM

## 2021-01-27 DIAGNOSIS — G20 Parkinson's disease: Secondary | ICD-10-CM

## 2021-01-27 DIAGNOSIS — R41841 Cognitive communication deficit: Secondary | ICD-10-CM

## 2021-01-27 DIAGNOSIS — F0280 Dementia in other diseases classified elsewhere without behavioral disturbance: Secondary | ICD-10-CM

## 2021-01-27 DIAGNOSIS — R278 Other lack of coordination: Secondary | ICD-10-CM

## 2021-01-27 DIAGNOSIS — R2681 Unsteadiness on feet: Secondary | ICD-10-CM

## 2021-01-27 DIAGNOSIS — R269 Unspecified abnormalities of gait and mobility: Secondary | ICD-10-CM | POA: Diagnosis not present

## 2021-01-27 DIAGNOSIS — M6281 Muscle weakness (generalized): Secondary | ICD-10-CM

## 2021-01-27 NOTE — Therapy (Signed)
[place] MAIN [facility] SERVICES [address] [place], [place], [address] Phone: [phone]   Fax:  [phone]  Physical Therapy Treatment  Patient Details  Name: [name] MRN: [id number] Date of Birth: Mar 18, 1951 Referring Provider (PT): Elsie Stain, MD   Encounter Date: 01/27/2021   PT End of Session - 01/27/21 1106     Visit Number 12    Number of Visits 24    Date for PT Re-Evaluation 03/08/21    Authorization Type [employer] Medicare Choice PPO    Authorization Time Period recert for 4/40-10/27    Progress Note Due on Visit 10    PT Start Time 1017    PT Stop Time 1058    PT Time Calculation (min) 41 min    Equipment Utilized During Treatment Gait belt    Activity Tolerance Patient tolerated treatment well    Behavior During Therapy WFL for tasks assessed/performed             Past Medical History:  Diagnosis Date   Arthritis    Bradycardia    Cancer ([place]) 2013   skin cancer   Depression    ptsd   Dysrhythmia    chronic slow heart rate   GERD (gastroesophageal reflux disease)    Headache(784.0)    tension headaches non recent   History of chicken pox    History of kidney stones    passed   Hypertension    treated with HCTZ   Pacemaker    Parkinson's disease ([place])    dx'ed 15 years ago   PTSD (post-traumatic stress disorder)    Shortness of breath dyspnea    Sleep apnea    doesn't use C-pap   Varicose veins     Past Surgical History:  Procedure Laterality Date   CHOLECYSTECTOMY N/A 10/22/2014   Procedure: LAPAROSCOPIC CHOLECYSTECTOMY WITH INTRAOPERATIVE CHOLANGIOGRAM;  Surgeon: Dia Crawford III, MD;  Location: [facility] ORS;  Service: General;  Laterality: N/A;   cyst removed      from lip as a child   INTRAMEDULLARY (IM) NAIL INTERTROCHANTERIC N/A 02/18/2019   Procedure: INTRAMEDULLARY (IM) NAIL INTERTROCHANTRIC, RIGHT,;  Surgeon: Thornton Park, MD;  Location: [facility] ORS;  Service: Orthopedics;  Laterality:  N/A;   LUMBAR LAMINECTOMY/DECOMPRESSION MICRODISCECTOMY Bilateral 12/14/2012   Procedure: Bilateral lumbar three-four, four-five decompressive laminotomy/foraminotomy;  Surgeon: Charlie Pitter, MD;  Location: [place] [facility]URO ORS;  Service: Neurosurgery;  Laterality: Bilateral;   PULSE GENERATOR IMPLANT Bilateral 12/13/2013   Procedure: Bilateral implantable pulse generator placement;  Surgeon: Erline Levine, MD;  Location: [place] [facility]UR[facility] ORS;  Service: Neurosurgery;  Laterality: Bilateral;  Bilateral implantable pulse generator placement   skin cancer removed     from ears,   12 lft arm  rt leg 15   SUBTHALAMIC STIMULATOR BATTERY REPLACEMENT Bilateral 07/14/2017   Procedure: BILATERAL IMPLANTED PULSE GENERATOR CHANGE FOR DEEP BRAIN STIMULATOR;  Surgeon: Erline Levine, MD;  Location: [place];  Service: Neurosurgery;  Laterality: Bilateral;   SUBTHALAMIC STIMULATOR INSERTION Bilateral 12/06/2013   Procedure: SUBTHALAMIC STIMULATOR INSERTION;  Surgeon: Erline Levine, MD;  Location: [place] NEURO ORS;  Service: Neurosurgery;  Laterality: Bilateral;  Bilateral deep brain stimulator placement    There were no vitals filed for this visit.   Subjective Assessment - 01/27/21 1020     Subjective Pt reports no back pain and no falls. Pt reports some low back pain after last appointment.    Patient is accompained by: Family member    Pertinent History  Tracy Kinner is a 88yoM who comes to [facility] OPPT neuro for evaluation of conitnued difficulty of imbalance, unsteadiness on feet, and generally limited tolerance to mobility in the setting of chronic parkinsonism. SInce DC in February, pt has had PPM placement due to bradycardia issues. Pt also started using an UpWalker device 2 week prior to this evaluation, goal was to help with gradual melting of postural extension that occured with sustained upright actiivty. Pt has continued to practice supervised AMB with caregivers, short distances out of house and in community. Pt continues to  experience frequent falls on a weekly basis, at a rate that both he and wife report to be increasing in frequency. PMH: HTN, bradycardia s/p PPM, PD, DBS, COVID-19 infection, chronic low back pain s/p L4/5 surgery.    Limitations Walking;Standing;Sitting;House hold activities;Lifting    How long can you sit comfortably? Not limited    How long can you stand comfortably? 3-5 minutes with support    How long can you walk comfortably? ~5-6 minutes UpWalker Walker    Patient Stated Goals Reduce falls, improve AMB tolerance    Currently in Pain? No/denies    Pain Onset More than a month ago    Pain Onset More than a month ago              INTERVENTIONS-    Neuro Re-education: Continued emphasis on motor control, standing large amplitude movement, rotation   The patient donned 1.5 pound weights on bilateral upper extremities to perform all of the following interventions   Seated:  Lean forward and bend followed by big sit up and upper extremity abduction 12x rates easy.  -- Next patient performed big reach down and reach up followed by shoulder abduction 12 times with 5 sec hold for each rep. Reports fatigue.   Seated twist and throw (2000 gr ball) to PT 5x each direction. Rates easy. Preformed to promote UE power.   Seated large amplitude twist with UE reach across and clap- 14x each direction; TC added for technique. Pt rates medium.  Isolated seated trunk twists with arms across chest 14x each direction. Pt reports feels like a stretch. PT provides hands-on assist.    Chair in front of patient with UE support, pt takes side step with reach 15x each direction. PT provides hands on assist/TC to promote ROM.   Chair placed in front of patient, patient holds onto a chair with 1 upper extremity for support, and patient standing with wide base of support.  Patient weight shifts to 1 side with big reach on same side  -15x each direction; patient rates medium    Chair placed in front of  patient for support, one UE support on chair, pt takes big step forward and rocks and reaches forward with ipsilateral UE  - 12x. Pt has greater difficulty with shoulder flexion/extension on R side.     STS from standard height chair, continued emphasis on large amplitude movement, 11x, 4x  with forward reach/hand flicks.  PT continues to cue patient for upright posture, focus on hip and knee extension. Patient rates medium.    Gait training-patient ambulates for 5 laps of 148 feet around PT clinic with upwalker to promote equal and increased step-length, improved sequencing.  PT also pulls slightly forward on walker to promote increased gait speed and increased step length for 4 laps, cuing to continue to increase gait speed. Cues pt in continuing effort independently of PT pulling walker. Pt able to maintain following cuing.  Does require rest break after 4th lap d/t decreased LLE stability from fatigue. Following rest break pt completes 5th lap.      Pt educated throughout session about proper posture and technique with exercises. Improved exercise technique, movement at target joints, use of target muscles after min to mod verbal, visual, tactile cues.        PT Education - 01/27/21 1105     Education provided Yes    Education Details exercise technique, body mechanics    Person(s) Educated Patient    Methods Explanation;Demonstration;Tactile cues;Verbal cues    Comprehension Verbalized understanding;Returned demonstration;Tactile cues required;Verbal cues required;Need further instruction              PT Short Term Goals - 01/11/21 1010       PT SHORT TERM GOAL #1   Title Pt to demonstrate improved sustained AMB tolerance >889f.    Baseline At evlauation: 6037ftolerance (not to failure); 9/26: 1384 ft    Time 4    Period Weeks    Status Achieved    Target Date 01/05/21      PT SHORT TERM GOAL #2   Title Pt to demonstrate [place]28 to show improvement in  balance.    Baseline Eval: 15/28; 9/26: deferred    Time 4    Period Weeks    Status On-going    Target Date 02/08/21      PT SHORT TERM GOAL #3   Title Pt to demonstrate 10MWT<13sec to show self-selected gait speed appropriate for AMB outside of house and to improve energy efficiency.    Baseline UpWalker Eval: 17.53sec; 9/26: 0.74 m/s (13.5)    Time 4    Period Weeks    Status Partially Met    Target Date 02/08/21               PT Long Term Goals - 01/11/21 1030       PT LONG TERM GOAL #1   Title Pt to score MiniBesTest > 20/28 to show improved balance.    Baseline Eval: 15/28; 9/26: deferred    Time 8    Period Weeks    Status On-going    Target Date 03/08/21      PT LONG TERM GOAL #2   Title Pt to tolerate 100023fustained AMB with UpWalker without rest interval to improve safety and independence in limited community AMB.    Baseline At eval; tired after 600f86f/26: 1384 ft    Time 8    Period Weeks    Status Achieved      PT LONG TERM GOAL #3   Title Pt to demonstrate improved FOTO score by 5 points compared to initital assessment.    Baseline 9/26: 49 (eval was 50)    Time 8    Period Weeks    Status On-going    Target Date 03/08/21      PT LONG TERM GOAL #4   Title Pt to show tolerance of AMB>1300ft29fh UpWalker to improve tolerance to accessing the community for IADL.    Baseline Eval: 600ft;16f26: 1385    Time 12    Period Weeks    Status Achieved      PT LONG TERM GOAL #5   Title Patient will increase six minute walk test distance to >1000 for progression to community ambulator and improve gait ability    Baseline 9/26: new    Time 8    Period Weeks  Status New    Target Date 03/08/21                   Plan - 01/27/21 1106     Clinical Impression Statement Pt able to improve volume of majority of interventions, indicating improved endurance and activity tolerance. Pt also able to improve gait speed following cuing and sustain  it independently. Pt most challenged with correcting for upright posture, maintaining crouched posture throughout. The pt will benefit from further skilled PT to improve mobility, strength, gait, and balance.    Personal Factors and Comorbidities Age;Past/Current Experience;Time since onset of injury/illness/exacerbation;Transportation    Comorbidities Chronic back pain, orthostatic hypotension, repeated falls, parkinsonism    Examination-Activity Limitations Bathing;Bed Mobility;Caring for Others;Bend;Dressing;Lift;Locomotion Level;Reach Overhead;Sit;Squat;Stairs;Stand;Toileting;Transfers;Carry    Examination-Participation Restrictions Cleaning;Community Activity;Interpersonal Relationship;Shop;Volunteer;Yard Work;Other;Driving    Stability/Clinical Decision Making Evolving/Moderate complexity    Rehab Potential Fair    PT Frequency 2x / week    PT Duration 12 weeks    PT Treatment/Interventions ADLs/Self Care Home Management;Aquatic Therapy;Cryotherapy;Electrical Stimulation;Iontophoresis 67m/ml Dexamethasone;Moist Heat;Ultrasound;Contrast Bath;DME Instruction;Gait training;Stair training;Functional mobility training;Therapeutic activities;Therapeutic exercise;Balance training;Neuromuscular re-education;Patient/family education;Manual techniques;Wheelchair mobility training;Energy conservation;Passive range of motion;Traction;Orthotic Fit/Training;Dry needling;Vestibular;Visual/perceptual remediation/compensation;Taping;Canalith Repostioning;Cognitive remediation    PT Next Visit Plan Advanced AMB tolerance, use MiniBesTest to guide balance interventions seated rotation and balance exercises    PT Home Exercise Plan None set up at evaluation, still working on previous HEP from 6 months prior (STS, daily walking); 8/29: issued STS with focus on large amplitude movement at support surface; 9/12: PT provides handout with sit to stand exercise and large amplitude seated trunk rotation exercise.  9/19:  reinforced HEP, provided handout for seated postural exercise with UE abduction, and seated exercise with trunk twists and UE reach, 10x for each, can be performed daily or minimum of 4x/week.    Consulted and Agree with Plan of Care Patient;Family member/caregiver    Family Member Consulted Wife             Patient will benefit from skilled therapeutic intervention in order to improve the following deficits and impairments:  Abnormal gait, Decreased balance, Decreased coordination, Decreased mobility, Postural dysfunction, Decreased strength, Decreased safety awareness, Improper body mechanics, Impaired flexibility, Decreased activity tolerance, Decreased endurance, Decreased knowledge of precautions, Difficulty walking, Pain, Cardiopulmonary status limiting activity, Decreased range of motion, Impaired perceived functional ability, Decreased cognition, Dizziness  Visit Diagnosis: Other abnormalities of gait and mobility  Unsteadiness on feet  Other lack of coordination  Muscle weakness (generalized)  Parkinson's disease ([place]     Problem List Patient Active Problem List   Diagnosis Date Noted   Sinus drainage 12/09/2020   Medicare annual wellness visit, subsequent 11/18/2020   Pacemaker    Dementia associated with Parkinson's disease ([place] 06/17/2020   Vertigo 02/19/2020   Urine abnormality 02/19/2020   Dysphagia 02/23/2019   Fall at home 10/18/2017   REM behavioral disorder 11/02/2016   Radicular pain in right arm 10/09/2016   GERD (gastroesophageal reflux disease) 07/31/2016   Colon cancer screening 07/23/2015   Gout 02/18/2015   Depression 01/06/2015   Skin lesion 10/02/2014   Dupuytren's contracture 08/20/2014   Cough 07/21/2014   Lumbar stenosis with neurogenic claudication 06/13/2014   S/P deep brain stimulator placement 05/08/2014   Advance care planning 01/19/2014   Parkinson's disease ([place] 12/13/2013   PTSD (post-traumatic stress disorder) 06/13/2013    Erectile dysfunction 06/13/2013   HLD (hyperlipidemia) 06/13/2013   Essential hypertension 06/03/2013   Bradycardia by electrocardiogram 06/03/2013  Obstructive sleep apnea 03/12/2013    Zollie Pee, PT 01/27/2021, 11:09 AM  [place] MAIN [facility] SERVICES [address] [place], [place], [address] Phone: [phone]   Fax:  [phone]  Name: [name] MRN: [id number] Date of Birth: 05/28/1950

## 2021-01-28 NOTE — Therapy (Signed)
[place] MAIN [facility] SERVICES [address] [place], [place], [address] Phone: [phone]   Fax:  [phone]  Speech Language Pathology Treatment Speech Therapy Progress Note   Patient Details  Name: [name] MRN: [id number] Date of Birth: 04/26/1950 Referring Provider (SLP): Elsie Stain   Encounter Date: 01/27/2021   Speech Therapy Progress Note   Dates of Reporting Period: 12/08/2020 to 01/27/2021   Objective: Patient has been seen for 10 speech therapy sessions this reporting period targeting cognitive communication and dysarthria. Patient is making progress toward LTGs and met 2/3 STGs this reporting period. See skilled intervention, clinical impressions, and goals below for details.     End of Session - 01/28/21 0816     Visit Number 10    Number of Visits 17    Date for SLP Re-Evaluation 03/04/21    Authorization Type [employer] Medicare Choice PPO    Authorization Time Period 12/08/2020 thru 03/04/2021    Authorization - Visit Number 10    Progress Note Due on Visit 10    SLP Start Time 1100    SLP Stop Time  1200    SLP Time Calculation (min) 60 min    Activity Tolerance Patient tolerated treatment well             Past Medical History:  Diagnosis Date   Arthritis    Bradycardia    Cancer ([place]) 2013   skin cancer   Depression    ptsd   Dysrhythmia    chronic slow heart rate   GERD (gastroesophageal reflux disease)    Headache(784.0)    tension headaches non recent   History of chicken pox    History of kidney stones    passed   Hypertension    treated with HCTZ   Pacemaker    Parkinson's disease ([place])    dx'ed 15 years ago   PTSD (post-traumatic stress disorder)    Shortness of breath dyspnea    Sleep apnea    doesn't use C-pap   Varicose veins     Past Surgical History:  Procedure Laterality Date   CHOLECYSTECTOMY N/A 10/22/2014   Procedure: LAPAROSCOPIC CHOLECYSTECTOMY WITH INTRAOPERATIVE  CHOLANGIOGRAM;  Surgeon: Dia Crawford III, MD;  Location: [facility] ORS;  Service: General;  Laterality: N/A;   cyst removed      from lip as a child   INTRAMEDULLARY (IM) NAIL INTERTROCHANTERIC N/A 02/18/2019   Procedure: INTRAMEDULLARY (IM) NAIL INTERTROCHANTRIC, RIGHT,;  Surgeon: Thornton Park, MD;  Location: [facility] ORS;  Service: Orthopedics;  Laterality: N/A;   LUMBAR LAMINECTOMY/DECOMPRESSION MICRODISCECTOMY Bilateral 12/14/2012   Procedure: Bilateral lumbar three-four, four-five decompressive laminotomy/foraminotomy;  Surgeon: Charlie Pitter, MD;  Location: [place] NEURO ORS;  Service: Neurosurgery;  Laterality: Bilateral;   PULSE GENERATOR IMPLANT Bilateral 12/13/2013   Procedure: Bilateral implantable pulse generator placement;  Surgeon: Erline Levine, MD;  Location: [place] NEURO ORS;  Service: Neurosurgery;  Laterality: Bilateral;  Bilateral implantable pulse generator placement   skin cancer removed     from ears,   12 lft arm  rt leg 15   SUBTHALAMIC STIMULATOR BATTERY REPLACEMENT Bilateral 07/14/2017   Procedure: BILATERAL IMPLANTED PULSE GENERATOR CHANGE FOR DEEP BRAIN STIMULATOR;  Surgeon: Erline Levine, MD;  Location: [place];  Service: Neurosurgery;  Laterality: Bilateral;   SUBTHALAMIC STIMULATOR INSERTION Bilateral 12/06/2013   Procedure: SUBTHALAMIC STIMULATOR INSERTION;  Surgeon: Erline Levine, MD;  Location: [place] [facility]URO ORS;  Service: Neurosurgery;  Laterality: Bilateral;  Bilateral deep brain stimulator placement  There were no vitals filed for this visit.   Subjective Assessment - 01/28/21 0750     Subjective "my speech still has pauses in it"    Patient is accompained by: Family member    Currently in Pain? No/denies                   ADULT SLP TREATMENT - 01/28/21 0001       Treatment Provided   Treatment provided Cognitive-Linquistic      Cognitive-Linquistic Treatment   Treatment focused on Cognition;Dysarthria;Patient/family/caregiver education    Skilled Treatment  COGNITIVE COMMUNICATION: introduced ways to improve response times by engaging in ministry tasks such as spouse giving him topic and pt quoting scripture related to topic, within session, pt's response time was much improved              SLP Education - 01/28/21 0816     Education Details pt's speech intelligibility, while much improved, is likely to be at new baseline    Person(s) Educated Patient;Spouse    Methods Explanation;Verbal cues    Comprehension Verbalized understanding;Need further instruction;Returned demonstration              SLP Short Term Goals - 01/28/21 0824       SLP SHORT TERM GOAL #1   Title Pt will maintain speech volume appropriate for 80% intelligibilty in 3 minutes simple conversation with rare min A.    Baseline 75%    Time 10    Period --   sessions   Status On-going      SLP SHORT TERM GOAL #2   Title Pt will use 1 external compensatory memory aid to recall orientation information with minimal assistance.    Status Achieved      SLP SHORT TERM GOAL #3   Title Pt will sustain attention to basic task for 1 minute with moderate assistance.    Baseline Goal not met in reporting period, revised to reflect slow progress towards goal    Time 10    Period --   sessions   Status Revised              SLP Long Term Goals - 01/28/21 0827       SLP LONG TERM GOAL #1   Title Pt will use speech intelligibility strategies to achieve ~ 80% speech intelligiblity at the sentence level with intermittent supervision cues.    Status On-going    Target Date 03/04/21      SLP LONG TERM GOAL #2   Title Patient will identify cognitive-communication barriers and participate in developing functional compensatory strategies.    Status On-going    Target Date 03/04/21      SLP LONG TERM GOAL #3   Title Pt will complete cognitive communication HEP.    Status On-going    Target Date 03/04/21              Plan - 01/28/21 0817     Clinical  Impression Statement Pt played audio of himself reading a passage of scripture. Pt was able to improve his speech during a second recording. Pt also played recording of himself dictating some poetry. While pt's speech intelligibility was improved, it continues to contain pauses and moments of pressed speech.  All recordings were intelligible at the sentence level even within unknown context. Education provided on functional speech vs. perfect speech. Ways created to promote timely responses but prognosis is guarded given significant deficits in attention and memory.  Speech Therapy Frequency 2x / week    Duration 8 weeks    Treatment/Interventions Compensatory techniques;Internal/external aids;Functional tasks;SLP instruction and feedback;Patient/family education    Potential to Achieve Goals Fair    Potential Considerations Ability to learn/carryover information;Medical prognosis;Severity of impairments;Co-morbidities;Previous level of function    SLP Home Exercise Plan provided, see pt instructions section    Consulted and Agree with Plan of Care Patient;Family member/caregiver    Family Member Consulted [name]             Patient will benefit from skilled therapeutic intervention in order to improve the following deficits and impairments:   Cognitive communication deficit  Parkinson's disease ([place])  Dementia associated with Parkinson's disease ([place])    Problem List Patient Active Problem List   Diagnosis Date Noted   Sinus drainage 12/09/2020   Medicare annual wellness visit, subsequent 11/18/2020   Pacemaker    Dementia associated with Parkinson's disease ([place]) 06/17/2020   Vertigo 02/19/2020   Urine abnormality 02/19/2020   Dysphagia 02/23/2019   Fall at home 10/18/2017   REM behavioral disorder 11/02/2016   Radicular pain in right arm 10/09/2016   GERD (gastroesophageal reflux disease) 07/31/2016   Colon cancer screening 07/23/2015   Gout 02/18/2015   Depression  01/06/2015   Skin lesion 10/02/2014   Dupuytren's contracture 08/20/2014   Cough 07/21/2014   Lumbar stenosis with neurogenic claudication 06/13/2014   S/P deep brain stimulator placement 05/08/2014   Advance care planning 01/19/2014   Parkinson's disease ([place]) 12/13/2013   PTSD (post-traumatic stress disorder) 06/13/2013   Erectile dysfunction 06/13/2013   HLD (hyperlipidemia) 06/13/2013   Essential hypertension 06/03/2013   Bradycardia by electrocardiogram 06/03/2013   Obstructive sleep apnea 03/12/2013   Keyron Pokorski B. Rutherford Nail M.S., CCC-SLP, [place] Pathologist Rehabilitation Services Office [phone]  Stormy Fabian 01/28/2021, 8:28 AM  [place] MAIN [facility] SERVICES [address] [place], [place], [address] Phone: [phone]   Fax:  [phone]   Name: [name] MRN: [id number] Date of Birth: 03-21-1951

## 2021-01-28 NOTE — Patient Instructions (Signed)
Record himself reading scripture and dictating poetry

## 2021-02-01 ENCOUNTER — Ambulatory Visit: Payer: Medicare PPO | Admitting: Physical Therapy

## 2021-02-01 ENCOUNTER — Ambulatory Visit: Payer: Medicare PPO | Admitting: Speech Pathology

## 2021-02-01 ENCOUNTER — Other Ambulatory Visit: Payer: Self-pay

## 2021-02-01 ENCOUNTER — Other Ambulatory Visit: Payer: Self-pay | Admitting: Family Medicine

## 2021-02-01 ENCOUNTER — Encounter: Payer: Self-pay | Admitting: Physical Therapy

## 2021-02-01 DIAGNOSIS — R2681 Unsteadiness on feet: Secondary | ICD-10-CM | POA: Diagnosis not present

## 2021-02-01 DIAGNOSIS — R296 Repeated falls: Secondary | ICD-10-CM

## 2021-02-01 DIAGNOSIS — R262 Difficulty in walking, not elsewhere classified: Secondary | ICD-10-CM | POA: Diagnosis not present

## 2021-02-01 DIAGNOSIS — R2689 Other abnormalities of gait and mobility: Secondary | ICD-10-CM | POA: Diagnosis not present

## 2021-02-01 DIAGNOSIS — R278 Other lack of coordination: Secondary | ICD-10-CM | POA: Diagnosis not present

## 2021-02-01 DIAGNOSIS — R471 Dysarthria and anarthria: Secondary | ICD-10-CM

## 2021-02-01 DIAGNOSIS — R269 Unspecified abnormalities of gait and mobility: Secondary | ICD-10-CM | POA: Diagnosis not present

## 2021-02-01 DIAGNOSIS — G20 Parkinson's disease: Secondary | ICD-10-CM

## 2021-02-01 DIAGNOSIS — M6281 Muscle weakness (generalized): Secondary | ICD-10-CM | POA: Diagnosis not present

## 2021-02-01 DIAGNOSIS — F0280 Dementia in other diseases classified elsewhere without behavioral disturbance: Secondary | ICD-10-CM

## 2021-02-01 DIAGNOSIS — R41841 Cognitive communication deficit: Secondary | ICD-10-CM

## 2021-02-01 NOTE — Therapy (Signed)
[place] MAIN [facility] SERVICES [address] [place], [place], [address] Phone: [phone]   Fax:  [phone]  Speech Language Pathology Treatment  Patient Details  Name: [name] MRN: [id number] Date of Birth: 08-18-1950 Referring Provider (SLP): Elsie Stain   Encounter Date: 02/01/2021   End of Session - 02/01/21 1632     Visit Number 11    Number of Visits 17    Date for SLP Re-Evaluation 03/04/21    Authorization Type [employer] Medicare Choice PPO    Authorization Time Period 12/08/2020 thru 03/04/2021    Authorization - Visit Number 1    Progress Note Due on Visit 10    SLP Start Time 1200    SLP Stop Time  1240    SLP Time Calculation (min) 40 min    Activity Tolerance Patient tolerated treatment well             Past Medical History:  Diagnosis Date   Arthritis    Bradycardia    Cancer ([place]) 2013   skin cancer   Depression    ptsd   Dysrhythmia    chronic slow heart rate   GERD (gastroesophageal reflux disease)    Headache(784.0)    tension headaches non recent   History of chicken pox    History of kidney stones    passed   Hypertension    treated with HCTZ   Pacemaker    Parkinson's disease ([place])    dx'ed 15 years ago   PTSD (post-traumatic stress disorder)    Shortness of breath dyspnea    Sleep apnea    doesn't use C-pap   Varicose veins     Past Surgical History:  Procedure Laterality Date   CHOLECYSTECTOMY N/A 10/22/2014   Procedure: LAPAROSCOPIC CHOLECYSTECTOMY WITH INTRAOPERATIVE CHOLANGIOGRAM;  Surgeon: Dia Crawford III, MD;  Location: [facility] ORS;  Service: General;  Laterality: N/A;   cyst removed      from lip as a child   INTRAMEDULLARY (IM) NAIL INTERTROCHANTERIC N/A 02/18/2019   Procedure: INTRAMEDULLARY (IM) NAIL INTERTROCHANTRIC, RIGHT,;  Surgeon: Thornton Park, MD;  Location: [facility] ORS;  Service: Orthopedics;  Laterality: N/A;   LUMBAR LAMINECTOMY/DECOMPRESSION MICRODISCECTOMY  Bilateral 12/14/2012   Procedure: Bilateral lumbar three-four, four-five decompressive laminotomy/foraminotomy;  Surgeon: Charlie Pitter, MD;  Location: [place] [facility];  Service: Neurosurgery;  Laterality: Bilateral;   PULSE GENERATOR IMPLANT Bilateral 12/13/2013   Procedure: Bilateral implantable pulse generator placement;  Surgeon: Erline Levine, MD;  Location: [place] [facility];  Service: Neurosurgery;  Laterality: Bilateral;  Bilateral implantable pulse generator placement   skin cancer removed     from ears,   12 lft arm  rt leg 15   SUBTHALAMIC STIMULATOR BATTERY REPLACEMENT Bilateral 07/14/2017   Procedure: BILATERAL IMPLANTED PULSE GENERATOR CHANGE FOR DEEP BRAIN STIMULATOR;  Surgeon: Erline Levine, MD;  Location: [place];  Service: Neurosurgery;  Laterality: Bilateral;   SUBTHALAMIC STIMULATOR INSERTION Bilateral 12/06/2013   Procedure: SUBTHALAMIC STIMULATOR INSERTION;  Surgeon: Erline Levine, MD;  Location: [place] [facility];  Service: Neurosurgery;  Laterality: Bilateral;  Bilateral deep brain stimulator placement    There were no vitals filed for this visit.   Subjective Assessment - 02/01/21 1628     Subjective "I have something for you"    Patient is accompained by: Family member    Currently in Pain? No/denies                   ADULT SLP TREATMENT -  02/01/21 0001       Treatment Provided   Treatment provided Cognitive-Linquistic      Cognitive-Linquistic Treatment   Treatment focused on Cognition;Dysarthria;Patient/family/caregiver education    Skilled Treatment Pt seen for treatment session in highly distracting environment of cafeteria. Pt's speech intelligibility was good at ~ 80% but his utterance length was shorter d/t attention deficits. Despite multiple therapeutic strategies, pt had difficulty responding to direct questions even after a lengthy wait time. In a setting like this, pt's functional ability is likely to be more of a passive observer than an active  conversationalist.              SLP Education - 02/01/21 1631     Education Details passive participant when engaging in tasks within a highly distracting environment, important conversation should happen in silent environment    Person(s) Educated Patient;Spouse    Methods Explanation;Demonstration;Verbal cues    Comprehension Verbalized understanding;Returned demonstration;Need further instruction              SLP Short Term Goals - 01/28/21 0824       SLP SHORT TERM GOAL #1   Title Pt will maintain speech volume appropriate for 80% intelligibilty in 3 minutes simple conversation with rare min A.    Baseline 75%    Time 10    Period --   sessions   Status On-going      SLP SHORT TERM GOAL #2   Title Pt will use 1 external compensatory memory aid to recall orientation information with minimal assistance.    Status Achieved      SLP SHORT TERM GOAL #3   Title Pt will sustain attention to basic task for 1 minute with moderate assistance.    Baseline Goal not met in reporting period, revised to reflect slow progress towards goal    Time 10    Period --   sessions   Status Revised              SLP Long Term Goals - 01/28/21 0827       SLP LONG TERM GOAL #1   Title Pt will use speech intelligibility strategies to achieve ~ 80% speech intelligiblity at the sentence level with intermittent supervision cues.    Status On-going    Target Date 03/04/21      SLP LONG TERM GOAL #2   Title Patient will identify cognitive-communication barriers and participate in developing functional compensatory strategies.    Status On-going    Target Date 03/04/21      SLP LONG TERM GOAL #3   Title Pt will complete cognitive communication HEP.    Status On-going    Target Date 03/04/21              Plan - 02/01/21 1632     Clinical Impression Statement Despite an abundance of suggested compensatory strategies, pt has not demonstrated a consistent ability to shorten  response times. He does benefit from having environmental distractions reduced but he is frequently distracted by things that can't be removed from environment.    Speech Therapy Frequency 2x / week    Duration 8 weeks    Treatment/Interventions Compensatory techniques;Internal/external aids;Functional tasks;SLP instruction and feedback;Patient/family education    Potential to Achieve Goals Fair    Potential Considerations Ability to learn/carryover information;Medical prognosis;Severity of impairments;Co-morbidities;Previous level of function    SLP Home Exercise Plan provided, see pt instructions section    Consulted and Agree with Plan of Care Patient;Family member/caregiver  Family Member Consulted [name]             Patient will benefit from skilled therapeutic intervention in order to improve the following deficits and impairments:   Cognitive communication deficit  Dysarthria and anarthria  Parkinson's disease ([place])  Dementia associated with Parkinson's disease ([place])    Problem List Patient Active Problem List   Diagnosis Date Noted   Sinus drainage 12/09/2020   Medicare annual wellness visit, subsequent 11/18/2020   Pacemaker    Dementia associated with Parkinson's disease ([place]) 06/17/2020   Vertigo 02/19/2020   Urine abnormality 02/19/2020   Dysphagia 02/23/2019   Fall at home 10/18/2017   REM behavioral disorder 11/02/2016   Radicular pain in right arm 10/09/2016   GERD (gastroesophageal reflux disease) 07/31/2016   Colon cancer screening 07/23/2015   Gout 02/18/2015   Depression 01/06/2015   Skin lesion 10/02/2014   Dupuytren's contracture 08/20/2014   Cough 07/21/2014   Lumbar stenosis with neurogenic claudication 06/13/2014   S/P deep brain stimulator placement 05/08/2014   Advance care planning 01/19/2014   Parkinson's disease ([place]) 12/13/2013   PTSD (post-traumatic stress disorder) 06/13/2013   Erectile dysfunction 06/13/2013   HLD  (hyperlipidemia) 06/13/2013   Essential hypertension 06/03/2013   Bradycardia by electrocardiogram 06/03/2013   Obstructive sleep apnea 03/12/2013    B. Rutherford Nail M.S., CCC-SLP, [place] Pathologist Rehabilitation Services Office [phone]  Stormy Fabian 02/01/2021, 4:36 PM  [place] MAIN [facility] SERVICES [address] [place], [place], [address] Phone: [phone]   Fax:  [phone]   Name: NIKKO GOLDWIRE MRN: [id number] Date of Birth: 1950/11/21

## 2021-02-01 NOTE — Therapy (Signed)
[place] MAIN [facility] SERVICES [address] [place], [place], [address] Phone: [phone]   Fax:  [phone]  Physical Therapy Treatment  Patient Details  Name: [name] MRN: [id number] Date of Birth: 31-Mar-1951 Referring Provider (PT): Elsie Stain, MD   Encounter Date: 02/01/2021   PT End of Session - 02/01/21 1105     Visit Number 13    Number of Visits 24    Date for PT Re-Evaluation 03/08/21    Authorization Type [employer] Medicare Choice PPO    Authorization Time Period recert for 2/37-62/83    Progress Note Due on Visit 10    PT Start Time 1100    PT Stop Time 1147    PT Time Calculation (min) 47 min    Equipment Utilized During Treatment Gait belt    Activity Tolerance Patient tolerated treatment well    Behavior During Therapy [facility] for tasks assessed/performed             Past Medical History:  Diagnosis Date   Arthritis    Bradycardia    Cancer ([place]) 2013   skin cancer   Depression    ptsd   Dysrhythmia    chronic slow heart rate   GERD (gastroesophageal reflux disease)    Headache(784.0)    tension headaches non recent   History of chicken pox    History of kidney stones    passed   Hypertension    treated with HCTZ   Pacemaker    Parkinson's disease ([place])    dx'ed 15 years ago   PTSD (post-traumatic stress disorder)    Shortness of breath dyspnea    Sleep apnea    doesn't use C-pap   Varicose veins     Past Surgical History:  Procedure Laterality Date   CHOLECYSTECTOMY N/A 10/22/2014   Procedure: LAPAROSCOPIC CHOLECYSTECTOMY WITH INTRAOPERATIVE CHOLANGIOGRAM;  Surgeon: Dia Crawford III, MD;  Location: [facility] ORS;  Service: General;  Laterality: N/A;   cyst removed      from lip as a child   INTRAMEDULLARY (IM) NAIL INTERTROCHANTERIC N/A 02/18/2019   Procedure: INTRAMEDULLARY (IM) NAIL INTERTROCHANTRIC, RIGHT,;  Surgeon: Thornton Park, MD;  Location: [facility] ORS;  Service: Orthopedics;  Laterality:  N/A;   LUMBAR LAMINECTOMY/DECOMPRESSION MICRODISCECTOMY Bilateral 12/14/2012   Procedure: Bilateral lumbar three-four, four-five decompressive laminotomy/foraminotomy;  Surgeon: Charlie Pitter, MD;  Location: [place] [facility] ORS;  Service: Neurosurgery;  Laterality: Bilateral;   PULSE GENERATOR IMPLANT Bilateral 12/13/2013   Procedure: Bilateral implantable pulse generator placement;  Surgeon: Erline Levine, MD;  Location: [place] [facility]UR[facility] ORS;  Service: Neurosurgery;  Laterality: Bilateral;  Bilateral implantable pulse generator placement   skin cancer removed     from ears,   12 lft arm  rt leg 15   SUBTHALAMIC STIMULATOR BATTERY REPLACEMENT Bilateral 07/14/2017   Procedure: BILATERAL IMPLANTED PULSE GENERATOR CHANGE FOR DEEP BRAIN STIMULATOR;  Surgeon: Erline Levine, MD;  Location: [place];  Service: Neurosurgery;  Laterality: Bilateral;   SUBTHALAMIC STIMULATOR INSERTION Bilateral 12/06/2013   Procedure: SUBTHALAMIC STIMULATOR INSERTION;  Surgeon: Erline Levine, MD;  Location: [place] [facility] OR[facility];  Service: Neurosurgery;  Laterality: Bilateral;  Bilateral deep brain stimulator placement    There were no vitals filed for this visit.   Subjective Assessment - 02/01/21 1103     Subjective pt reports one fall on Friday when he was in the bathroom. His wife reports he was attempting to pull up his pants and fell. Reports slight increase in back pain  the day following and since.    Patient is accompained by: Family member    Pertinent History [name] is a 51yoM who [facility] neuro for evaluation of conitnued difficulty of imbalance, unsteadiness on feet, and generally limited tolerance to mobility in the setting of chronic parkinsonism. SInce DC in February, pt has had PPM placement due to bradycardia issues. Pt also started using an UpWalker device 2 week prior to this evaluation, goal was to help with gradual melting of postural extension that occured with sustained upright actiivty. Pt has continued to practice  supervised AMB with caregivers, short distances out of house and in community. Pt continues to experience frequent falls on a weekly basis, at a rate that both he and wife report to be increasing in frequency. PMH: HTN, bradycardia s/p PPM, PD, DBS, COVID-19 infection, chronic low back pain s/p L4/5 surgery.    Limitations Walking;Standing;Sitting;House hold activities;Lifting    How long can you sit comfortably? Not limited    How long can you stand comfortably? 3-5 minutes with support    How long can you walk comfortably? ~5-6 minutes UpWalker Walker    Patient Stated Goals Reduce falls, improve AMB tolerance    Pain Score 7     Pain Location Back             Treatment provided this session     Neuro Re- Ed:   Seated:  -PWR UP x 5 -C/o increased low back pain, exercise ceased as a result.  -PWR! Twist 20x 10 with 2# arm weight, pt rated as medium  -Cues for full horizontal abduction of arms between each repetition -PWR! Rock  2x10  -cues for increased weight shift superiorly using cones as external cue for rwaching -much improved weight shifting  PWR! step x 2 x 10 with hurdle for external cue to step over  -cues for larger stp and for speed of step   Sit to stand PWR! UP 2 x 10  -for improved transfer ability -cues for speed and hor ABD of the shoulders   Standing   PWR! Up- x 10  -Chair anterior to pt with cue to reach toward seat to improve a/p weight shifting. Pt reports reaching to chair improved LBP with this activity  PWR twist 2  x 10  -Cues for full horizontal abduction between each repetition PWR! Rock 2 x 10  External cues utilizing cones superior and lateral to pt for reaching, improved weight shifting noted with theses cues this session.  PWR! Step 2  x 10 with hurdle for external cue for large and high amplitude steps  Ambulaiton training with dual task (motor and cognitive)   - 2 min with naming fish(dual task motor and cognitive), pt had tendency to  veer toward right but was able to improve following cues. Pt also had difficulty with dual task.  - 2 min ambulation with metronome, started at 60 SPM and progressed to 70 SPM, steps were smaller but more consistent at 70 BPM       Pt required occasional rest breaks due fatigue, PT was quick to ask when pt appeared to be fatiguing in order to prevent excessive fatigue.  Pt had tendency to report in creased back pain with lumbar and thoracic flexion based activities. With alterations in these pt did not report increase in baseline back pain levels.   Pt educated throughout session about proper posture and technique with exercises. Improved exercise technique, movement at target joints,  use of target muscles after min to mod verbal, visual, tactile cues.                            PT Education - 02/01/21 1105     Education Details Exc technique and form    Person(s) Educated Patient    Methods Explanation;Demonstration;Tactile cues;Verbal cues    Comprehension Verbalized understanding;Returned demonstration;Verbal cues required;Tactile cues required;Need further instruction              PT Short Term Goals - 01/11/21 1010       PT SHORT TERM GOAL #1   Title Pt to demonstrate improved sustained AMB tolerance >870f.    Baseline At evlauation: 6045ftolerance (not to failure); 9/26: 1384 ft    Time 4    Period Weeks    Status Achieved    Target Date 01/05/21      PT SHORT TERM GOAL #2   Title Pt to demonstrate MiArabi17/28 to show improvement in balance.    Baseline Eval: 15/28; 9/26: deferred    Time 4    Period Weeks    Status On-going    Target Date 02/08/21      PT SHORT TERM GOAL #3   Title Pt to demonstrate 10MWT<13sec to show self-selected gait speed appropriate for AMB outside of house and to improve energy efficiency.    Baseline UpWalker Eval: 17.53sec; 9/26: 0.74 m/s (13.5)    Time 4    Period Weeks    Status Partially Met     Target Date 02/08/21               PT Long Term Goals - 01/11/21 1030       PT LONG TERM GOAL #1   Title Pt to score MiniBesTest > 20/28 to show improved balance.    Baseline Eval: 15/28; 9/26: deferred    Time 8    Period Weeks    Status On-going    Target Date 03/08/21      PT LONG TERM GOAL #2   Title Pt to tolerate 100042fustained AMB with UpWalker without rest interval to improve safety and independence in limited community AMB.    Baseline At eval; tired after 600f17f/26: 1384 ft    Time 8    Period Weeks    Status Achieved      PT LONG TERM GOAL #3   Title Pt to demonstrate improved FOTO score by 5 points compared to initital assessment.    Baseline 9/26: 49 (eval was 50)    Time 8    Period Weeks    Status On-going    Target Date 03/08/21      PT LONG TERM GOAL #4   Title Pt to show tolerance of AMB>1300ft36fh UpWalker to improve tolerance to accessing the community for IADL.    Baseline Eval: 600ft;49f26: 1385    Time 12    Period Weeks    Status Achieved      PT LONG TERM GOAL #5   Title Patient will increase six minute walk test distance to >1000 for progression to community ambulator and improve gait ability    Baseline 9/26: new    Time 8    Period Weeks    Status New    Target Date 03/08/21                   Plan - 02/01/21  1105     Clinical Impression Statement pt demonstratedf improved toeralnce and efficacy seated and standing neuromuscular re-education activities performed in therapy session. Pt responded well to external cues as described in treatmetn session in comparison to visual cues or tacile cues. Pt did have on fall last week but overall his caregive reprots significant reduction in fall frequency since start of PT care. Pt was frequenty assess for if exercises exacerbater LBP and exercies were changed or eliminated as needed. Pt will continue to benefit from skilled physical therapy in tervention in order to improve his  balance, reduce fall risk, improve strength and improve QOL.    Personal Factors and Comorbidities Age;Past/Current Experience;Time since onset of injury/illness/exacerbation;Transportation    Comorbidities Chronic back pain, orthostatic hypotension, repeated falls, parkinsonism    Examination-Activity Limitations Bathing;Bed Mobility;Caring for Others;Bend;Dressing;Lift;Locomotion Level;Reach Overhead;Sit;Squat;Stairs;Stand;Toileting;Transfers;Carry    Examination-Participation Restrictions Cleaning;Community Activity;Interpersonal Relationship;Shop;Volunteer;Yard Work;Other;Driving    Stability/Clinical Decision Making Evolving/Moderate complexity    Rehab Potential Fair    PT Frequency 2x / week    PT Duration 12 weeks    PT Treatment/Interventions ADLs/Self Care Home Management;Aquatic Therapy;Cryotherapy;Electrical Stimulation;Iontophoresis 3m/ml Dexamethasone;Moist Heat;Ultrasound;Contrast Bath;DME Instruction;Gait training;Stair training;Functional mobility training;Therapeutic activities;Therapeutic exercise;Balance training;Neuromuscular re-education;Patient/family education;Manual techniques;Wheelchair mobility training;Energy conservation;Passive range of motion;Traction;Orthotic Fit/Training;Dry needling;Vestibular;Visual/perceptual remediation/compensation;Taping;Canalith Repostioning;Cognitive remediation    PT Next Visit Plan Advanced AMB tolerance, use MiniBesTest to guide balance interventions seated rotation and balance exercises    PT Home Exercise Plan None set up at evaluation, still working on previous HEP from 6 months prior (STS, daily walking); 8/29: issued STS with focus on large amplitude movement at support surface; 9/12: PT provides handout with sit to stand exercise and large amplitude seated trunk rotation exercise.  9/19: reinforced HEP, provided handout for seated postural exercise with UE abduction, and seated exercise with trunk twists and UE reach, 10x for each, can be  performed daily or minimum of 4x/week.    Consulted and Agree with Plan of Care Patient;Family member/caregiver    Family Member Consulted Wife             Patient will benefit from skilled therapeutic intervention in order to improve the following deficits and impairments:  Abnormal gait, Decreased balance, Decreased coordination, Decreased mobility, Postural dysfunction, Decreased strength, Decreased safety awareness, Improper body mechanics, Impaired flexibility, Decreased activity tolerance, Decreased endurance, Decreased knowledge of precautions, Difficulty walking, Pain, Cardiopulmonary status limiting activity, Decreased range of motion, Impaired perceived functional ability, Decreased cognition, Dizziness  Visit Diagnosis: Muscle weakness (generalized)  Other lack of coordination  Repeated falls  Difficulty walking  Unsteadiness on feet     Problem List Patient Active Problem List   Diagnosis Date Noted   Sinus drainage 12/09/2020   Medicare annual wellness visit, subsequent 11/18/2020   Pacemaker    Dementia [place]'s disease [place] 06/17/2020   Vertigo 02/19/2020   Urine abnormality 02/19/2020   Dysphagia 02/23/2019   Fall at home 10/18/2017   REM behavioral disorder 11/02/2016   Radicular pain in right arm 10/09/2016   GERD (gastroesophageal reflux disease) 07/31/2016   Colon cancer screening 07/23/2015   Gout 02/18/2015   Depression 01/06/2015   Skin lesion 10/02/2014   Dupuytren's contracture 08/20/2014   Cough 07/21/2014   Lumbar stenosis with neurogenic claudication 06/13/2014   S/P deep brain stimulator placement 05/08/2014   Advance care planning 01/19/2014   [place]'s disease ([place] 12/13/2013   PTSD (post-traumatic stress disorder) 06/13/2013   Erectile dysfunction 06/13/2013   HLD (hyperlipidemia) 06/13/2013   Essential hypertension  06/03/2013   Bradycardia by electrocardiogram 06/03/2013   Obstructive sleep apnea 03/12/2013     Particia Lather, PT 02/01/2021, 12:29 PM  [place] MAIN [facility] SERVICES [address] [place], [place], [address] Phone: [phone]   Fax:  [phone]  Name: TALVIN CHRISTIANSON MRN: [id number] Date of Birth: 1950-05-13

## 2021-02-03 ENCOUNTER — Other Ambulatory Visit: Payer: Self-pay

## 2021-02-03 ENCOUNTER — Ambulatory Visit: Payer: Medicare PPO

## 2021-02-03 ENCOUNTER — Ambulatory Visit: Payer: Medicare PPO | Admitting: Speech Pathology

## 2021-02-03 DIAGNOSIS — R269 Unspecified abnormalities of gait and mobility: Secondary | ICD-10-CM

## 2021-02-03 DIAGNOSIS — R2681 Unsteadiness on feet: Secondary | ICD-10-CM

## 2021-02-03 DIAGNOSIS — R2689 Other abnormalities of gait and mobility: Secondary | ICD-10-CM | POA: Diagnosis not present

## 2021-02-03 DIAGNOSIS — R296 Repeated falls: Secondary | ICD-10-CM | POA: Diagnosis not present

## 2021-02-03 DIAGNOSIS — R262 Difficulty in walking, not elsewhere classified: Secondary | ICD-10-CM

## 2021-02-03 DIAGNOSIS — F0280 Dementia in other diseases classified elsewhere without behavioral disturbance: Secondary | ICD-10-CM

## 2021-02-03 DIAGNOSIS — R278 Other lack of coordination: Secondary | ICD-10-CM | POA: Diagnosis not present

## 2021-02-03 DIAGNOSIS — R471 Dysarthria and anarthria: Secondary | ICD-10-CM | POA: Diagnosis not present

## 2021-02-03 DIAGNOSIS — G20 Parkinson's disease: Secondary | ICD-10-CM

## 2021-02-03 DIAGNOSIS — M6281 Muscle weakness (generalized): Secondary | ICD-10-CM | POA: Diagnosis not present

## 2021-02-03 DIAGNOSIS — R41841 Cognitive communication deficit: Secondary | ICD-10-CM

## 2021-02-03 NOTE — Therapy (Signed)
[place] MAIN [facility] SERVICES [address] [place], [place], [address] Phone: [phone]   Fax:  [phone]  Physical Therapy Treatment  Patient Details  Name: [name] MRN: [id number] Date of Birth: 1950/07/15 Referring Provider (PT): George Stain, MD   Encounter Date: 02/03/2021   PT End of Session - 02/03/21 1510     Visit Number 14    Number of Visits 24    Date for PT Re-Evaluation 03/08/21    Authorization Type [employer] Medicare Choice PPO    Authorization Time Period recert for 2/17-47/15    Progress Note Due on Visit 10    PT Start Time 1407    PT Stop Time 1450    PT Time Calculation (min) 43 min    Equipment Utilized During Treatment Gait belt    Activity Tolerance Patient tolerated treatment well    Behavior During Therapy [facility] for tasks assessed/performed             Past Medical History:  Diagnosis Date   Arthritis    Bradycardia    Cancer ([place]) 2013   skin cancer   Depression    ptsd   Dysrhythmia    chronic slow heart rate   GERD (gastroesophageal reflux disease)    Headache(784.0)    tension headaches non recent   History of chicken pox    History of kidney stones    passed   Hypertension    treated with HCTZ   Pacemaker    Parkinson's disease ([place])    dx'ed 15 years ago   PTSD (post-traumatic stress disorder)    Shortness of breath dyspnea    Sleep apnea    doesn't use C-pap   Varicose veins     Past Surgical History:  Procedure Laterality Date   CHOLECYSTECTOMY N/A 10/22/2014   Procedure: LAPAROSCOPIC CHOLECYSTECTOMY WITH INTRAOPERATIVE CHOLANGIOGRAM;  Surgeon: Dia Crawford III, MD;  Location: [facility] ORS;  Service: General;  Laterality: N/A;   cyst removed      from lip as a child   INTRAMEDULLARY (IM) NAIL INTERTROCHANTERIC N/A 02/18/2019   Procedure: INTRAMEDULLARY (IM) NAIL INTERTROCHANTRIC, RIGHT,;  Surgeon: Thornton Park, MD;  Location: [facility] ORS;  Service: Orthopedics;  Laterality:  N/A;   LUMBAR LAMINECTOMY/DECOMPRESSION MICRODISCECTOMY Bilateral 12/14/2012   Procedure: Bilateral lumbar three-four, four-five decompressive laminotomy/foraminotomy;  Surgeon: Charlie Pitter, MD;  Location: [place] [facility]URO ORS;  Service: Neurosurgery;  Laterality: Bilateral;   PULSE GENERATOR IMPLANT Bilateral 12/13/2013   Procedure: Bilateral implantable pulse generator placement;  Surgeon: Erline Levine, MD;  Location: [place] [facility]UR[facility] ORS;  Service: Neurosurgery;  Laterality: Bilateral;  Bilateral implantable pulse generator placement   skin cancer removed     from ears,   12 lft arm  rt leg 15   SUBTHALAMIC STIMULATOR BATTERY REPLACEMENT Bilateral 07/14/2017   Procedure: BILATERAL IMPLANTED PULSE GENERATOR CHANGE FOR DEEP BRAIN STIMULATOR;  Surgeon: Erline Levine, MD;  Location: [place];  Service: Neurosurgery;  Laterality: Bilateral;   SUBTHALAMIC STIMULATOR INSERTION Bilateral 12/06/2013   Procedure: SUBTHALAMIC STIMULATOR INSERTION;  Surgeon: Erline Levine, MD;  Location: [place] NEUR[facility] ORS;  Service: Neurosurgery;  Laterality: Bilateral;  Bilateral deep brain stimulator placement    There were no vitals filed for this visit.   Subjective Assessment - 02/03/21 1506     Subjective Patient and wife report that he was having more balance issues and much increased report of low back pain today- Rates at a 8/10    Patient is accompained by:  Family member    Pertinent History [name] is a 68yoM who comes to [facility]PPT neuro for evaluation of conitnued difficulty of imbalance, unsteadiness on feet, and generally limited tolerance to mobility in the setting of chronic parkinsonism. SInce DC in February, pt has had PPM placement due to bradycardia issues. Pt also started using an UpWalker device 2 week prior to this evaluation, goal was to help with gradual melting of postural extension that occured with sustained upright actiivty. Pt has continued to practice supervised AMB with caregivers, short distances out of house and  in community. Pt continues to experience frequent falls on a weekly basis, at a rate that both he and wife report to be increasing in frequency. PMH: HTN, bradycardia s/p PPM, PD, DBS, COVID-19 infection, chronic low back pain s/p L4/5 surgery.    Limitations Walking;Standing;Sitting;House hold activities;Lifting    How long can you sit comfortably? Not limited    How long can you stand comfortably? 3-5 minutes with support    How long can you walk comfortably? ~5-6 minutes UpWalker Walker    Patient Stated Goals Reduce falls, improve AMB tolerance    Currently in Pain? Yes    Pain Score 8     Pain Location Back    Pain Orientation Posterior;Lower    Pain Descriptors / Indicators Aching    Pain Type Chronic pain    Pain Onset More than a month ago    Pain Frequency Constant    Aggravating Factors  prolonged sitting; walking    Pain Relieving Factors Rest; Changing position    Effect of Pain on Daily Activities Difficulty walking and performing exercises.            Interventions:   Moist heat to patient in sitting in chair (as patient was complaining of increased LBP)  while performing LE strengthening   Seated Hip march 4lb BE 2 x 12 reps Seated knee ext 4lb BLE 2 x 12 reps Seated hip abd 4lb. B:E 2 x 12 reps Seated Hip flex/abd 4lb. 2x 12 reps.  Education provided throughout session via VC/TC and demonstration to facilitate movement at target joints and correct muscle activation for all testing and exercises performed.   Attempted the Parkinsons wellness recovery activities including UP (forward/backward trunk lean) x 5 yet c/o of increased Low back pain- ceased all PWR routinets Sit to stand without UE support from straight back chair-  -for improved transfer ability - Horizontal shoulder  ABD with Blue TB x 15 reps - Scap retraction seated with blue TB x 15 reps - Shoulder ext seated with blue TB x 15 reps    Standing and walking activities deferred secondary to c/o LBP  8/10 today.              Clinical Impression: Treatment was limited to seated Therex  with moist heat due to complaint of low back pain. Patient required multiple verbal and tactile cues to perform seated LE strengthening. He was unable to complete mobility exercises due to low back pain. He did report "some relief- 5/10 low back pain after heat and seated exercises." The pt will benefit from further skilled PT to improve mobility, strength, gait, and balance.                      PT Education - 02/03/21 1510     Education provided Yes    Education Details Pain relief techniques; Exercise technique    Person(s) Educated Patient  Methods Explanation;Demonstration;Tactile cues;Verbal cues    Comprehension Verbalized understanding;Returned demonstration;Verbal cues required;Need further instruction              PT Short Term Goals - 01/11/21 1010       PT SHORT TERM GOAL #1   Title Pt to demonstrate improved sustained AMB tolerance >850ft.    Baseline At evlauation: 653ft tolerance (not to failure); 9/26: 1384 ft    Time 4    Period Weeks    Status Achieved    Target Date 01/05/21      PT SHORT TERM GOAL #2   Title Pt to demonstrate [employer] >17/28 to show improvement in balance.    Baseline Eval: 15/28; 9/26: deferred    Time 4    Period Weeks    Status On-going    Target Date 02/08/21      PT SHORT TERM GOAL #3   Title Pt to demonstrate 10MWT<13sec to show self-selected gait speed appropriate for AMB outside of house and to improve energy efficiency.    Baseline UpWalker Eval: 17.53sec; 9/26: 0.74 m/s (13.5)    Time 4    Period Weeks    Status Partially Met    Target Date 02/08/21               PT Long Term Goals - 01/11/21 1030       PT LONG TERM GOAL #1   Title Pt to score MiniBesTest > 20/28 to show improved balance.    Baseline Eval: 15/28; 9/26: deferred    Time 8    Period Weeks    Status On-going    Target Date  03/08/21      PT LONG TERM GOAL #2   Title Pt to tolerate 1046ft sustained AMB with UpWalker without rest interval to improve safety and independence in limited community AMB.    Baseline At eval; tired after 667ft; 9/26: 1384 ft    Time 8    Period Weeks    Status Achieved      PT LONG TERM GOAL #3   Title Pt to demonstrate improved FOTO score by 5 points compared to initital assessment.    Baseline 9/26: 49 (eval was 50)    Time 8    Period Weeks    Status On-going    Target Date 03/08/21      PT LONG TERM GOAL #4   Title Pt to show tolerance of AMB>1322ft with UpWalker to improve tolerance to accessing the community for IADL.    Baseline Eval: 664ft;  9/26: 1385    Time 12    Period Weeks    Status Achieved      PT LONG TERM GOAL #5   Title Patient will increase six minute walk test distance to >1000 for progression to community ambulator and improve gait ability    Baseline 9/26: new    Time 8    Period Weeks    Status New    Target Date 03/08/21                   Plan - 02/03/21 1500     Clinical Impression Statement Treatment was limited to seated Therex with moist heat due to complaint of low back pain. Patient required multiple verbal and tactile cues to perform seated LE strengthening. He was unable to complete mobility exercises due to low back pain. He did report "some relief- 5/10 low back pain after heat and seated exercises." The pt will  benefit from further skilled PT to improve mobility, strength, gait, and balance.    Personal Factors and Comorbidities Age;Past/Current Experience;Time since onset of injury/illness/exacerbation;Transportation    Comorbidities Chronic back pain, orthostatic hypotension, repeated falls, parkinsonism    Examination-Activity Limitations Bathing;Bed Mobility;Caring for Others;Bend;Dressing;Lift;Locomotion Level;Reach Overhead;Sit;Squat;Stairs;Stand;Toileting;Transfers;Carry    Examination-Participation Restrictions  Cleaning;Community Activity;Interpersonal Relationship;Shop;Volunteer;Yard Work;Other;Driving    Stability/Clinical Decision Making Evolving/Moderate complexity    Rehab Potential Fair    PT Frequency 2x / week    PT Duration 12 weeks    PT Treatment/Interventions ADLs/Self Care Home Management;Aquatic Therapy;Cryotherapy;Electrical Stimulation;Iontophoresis 4mg /ml Dexamethasone;Moist Heat;Ultrasound;Contrast Bath;DME Instruction;Gait training;Stair training;Functional mobility training;Therapeutic activities;Therapeutic exercise;Balance training;Neuromuscular re-education;Patient/family education;Manual techniques;Wheelchair mobility training;Energy conservation;Passive range of motion;Traction;Orthotic Fit/Training;Dry needling;Vestibular;Visual/perceptual remediation/compensation;Taping;Canalith Repostioning;Cognitive remediation    PT Next Visit Plan Advanced AMB tolerance, use MiniBesTest to guide balance interventions seated rotation and balance exercises    PT Home Exercise Plan None set up at evaluation, still working on previous HEP from 6 months prior (STS, daily walking); 8/29: issued STS with focus on large amplitude movement at support surface; 9/12: PT provides handout with sit to stand exercise and large amplitude seated trunk rotation exercise.  9/19: reinforced HEP, provided handout for seated postural exercise with UE abduction, and seated exercise with trunk twists and UE reach, 10x for each, can be performed daily or minimum of 4x/week. No changes today.    Consulted and Agree with Plan of Care Patient;Family member/caregiver    Family Member Consulted Wife             Patient will benefit from skilled therapeutic intervention in order to improve the following deficits and impairments:  Abnormal gait, Decreased balance, Decreased coordination, Decreased mobility, Postural dysfunction, Decreased strength, Decreased safety awareness, Improper body mechanics, Impaired flexibility,  Decreased activity tolerance, Decreased endurance, Decreased knowledge of precautions, Difficulty walking, Pain, Cardiopulmonary status limiting activity, Decreased range of motion, Impaired perceived functional ability, Decreased cognition, Dizziness  Visit Diagnosis: Abnormality of gait and mobility  Difficulty in walking, not elsewhere classified  Muscle weakness (generalized)  Unsteadiness on feet     Problem List Patient Active Problem List   Diagnosis Date Noted   Sinus drainage 12/09/2020   Medicare annual wellness visit, subsequent 11/18/2020   Pacemaker    Dementia associated with Parkinson's disease ([place]) 06/17/2020   Vertigo 02/19/2020   Urine abnormality 02/19/2020   Dysphagia 02/23/2019   Fall at home 10/18/2017   REM behavioral disorder 11/02/2016   Radicular pain in right arm 10/09/2016   GERD (gastroesophageal reflux disease) 07/31/2016   Colon cancer screening 07/23/2015   Gout 02/18/2015   Depression 01/06/2015   Skin lesion 10/02/2014   Dupuytren's contracture 08/20/2014   Cough 07/21/2014   Lumbar stenosis with neurogenic claudication 06/13/2014   S/P deep brain stimulator placement 05/08/2014   Advance care planning 01/19/2014   Parkinson's disease ([place]) 12/13/2013   PTSD (post-traumatic stress disorder) 06/13/2013   Erectile dysfunction 06/13/2013   HLD (hyperlipidemia) 06/13/2013   Essential hypertension 06/03/2013   Bradycardia by electrocardiogram 06/03/2013   Obstructive sleep apnea 03/12/2013    Lewis Moccasin, PT 02/03/2021, 3:13 PM  [place] MAIN [facility] SERVICES [address] [place], [place], [address] Phone: [phone]   Fax:  [phone]  Name: [name] MRN: [id number] Date of Birth: 03/05/1951

## 2021-02-04 NOTE — Patient Instructions (Signed)
Write down or have his wife write down point to help with memory as well as use sticky notes etc

## 2021-02-04 NOTE — Therapy (Signed)
[place] MAIN [facility] SERVICES [address] [place], [place], [address] Phone: [phone]   Fax:  [phone]  Speech Language Pathology Treatment  Patient Details  Name: [name] MRN: [id number] Date of Birth: 1950-11-14 Referring Provider (SLP): Elsie Stain   Encounter Date: 02/03/2021   End of Session - 02/04/21 1150     Visit Number 12    Number of Visits 17    Date for SLP Re-Evaluation 03/04/21    Authorization Type [employer] Medicare Choice PPO    Authorization Time Period 12/08/2020 thru 03/04/2021    Authorization - Visit Number 2    Progress Note Due on Visit 10    SLP Start Time 1300    SLP Stop Time  1400    SLP Time Calculation (min) 60 min    Activity Tolerance Patient tolerated treatment well             Past Medical History:  Diagnosis Date   Arthritis    Bradycardia    Cancer ([place]) 2013   skin cancer   Depression    ptsd   Dysrhythmia    chronic slow heart rate   GERD (gastroesophageal reflux disease)    Headache(784.0)    tension headaches non recent   History of chicken pox    History of kidney stones    passed   Hypertension    treated with HCTZ   Pacemaker    Parkinson's disease ([place])    dx'ed 15 years ago   PTSD (post-traumatic stress disorder)    Shortness of breath dyspnea    Sleep apnea    doesn't use C-pap   Varicose veins     Past Surgical History:  Procedure Laterality Date   CHOLECYSTECTOMY N/A 10/22/2014   Procedure: LAPAROSCOPIC CHOLECYSTECTOMY WITH INTRAOPERATIVE CHOLANGIOGRAM;  Surgeon: Dia Crawford III, MD;  Location: [facility] ORS;  Service: General;  Laterality: N/A;   cyst removed      from lip as a child   INTRAMEDULLARY (IM) NAIL INTERTROCHANTERIC N/A 02/18/2019   Procedure: INTRAMEDULLARY (IM) NAIL INTERTROCHANTRIC, RIGHT,;  Surgeon: Thornton Park, MD;  Location: [facility] ORS;  Service: Orthopedics;  Laterality: N/A;   LUMBAR LAMINECTOMY/DECOMPRESSION MICRODISCECTOMY  Bilateral 12/14/2012   Procedure: Bilateral lumbar three-four, four-five decompressive laminotomy/foraminotomy;  Surgeon: Charlie Pitter, MD;  Location: [place] [facility]O ORS;  Service: Neurosurgery;  Laterality: Bilateral;   PULSE GENERATOR IMPLANT Bilateral 12/13/2013   Procedure: Bilateral implantable pulse generator placement;  Surgeon: Erline Levine, MD;  Location: [place] NEURO ORS;  Service: Neurosurgery;  Laterality: Bilateral;  Bilateral implantable pulse generator placement   skin cancer removed     from ears,   12 lft arm  rt leg 15   SUBTHALAMIC STIMULATOR BATTERY REPLACEMENT Bilateral 07/14/2017   Procedure: BILATERAL IMPLANTED PULSE GENERATOR CHANGE FOR DEEP BRAIN STIMULATOR;  Surgeon: Erline Levine, MD;  Location: [place];  Service: Neurosurgery;  Laterality: Bilateral;   SUBTHALAMIC STIMULATOR INSERTION Bilateral 12/06/2013   Procedure: SUBTHALAMIC STIMULATOR INSERTION;  Surgeon: Erline Levine, MD;  Location: [place] [facility]O ORS;  Service: Neurosurgery;  Laterality: Bilateral;  Bilateral deep brain stimulator placement    There were no vitals filed for this visit.   Subjective Assessment - 02/04/21 1148     Subjective "I brought some thing to share with you"    Patient is accompained by: Family member    Currently in Pain? No/denies  ADULT SLP TREATMENT - 02/04/21 0001       Treatment Provided   Treatment provided Cognitive-Linquistic      Cognitive-Linquistic Treatment   Treatment focused on Cognition;Dysarthria;Patient/family/caregiver education    Skilled Treatment COGNITIVE COMMUNICATION: pt had several insightful moments regarding his memory and distractions; pt has written down several references of scripture but neglected to write down several points regarding the scripture - he stated "I knew what I wanted to say and I thought I would remember it but when I got here, I can't remember," pt also demonstrated improved insight into attention deficits by commenting "I  don't think I would be so confused if I only used one translation of the Bible"; pt instructed in taking notes while looking at scripture and to help with quoting correct versus, pt given sticky flags to put beside scripture              SLP Education - 02/04/21 1150     Education Details use of compensatory memory strategies    Person(s) Educated Patient;Spouse    Methods Explanation;Demonstration;Verbal cues;Handout    Comprehension Verbalized understanding;Need further instruction              SLP Short Term Goals - 01/28/21 0824       SLP SHORT TERM GOAL #1   Title Pt will maintain speech volume appropriate for 80% intelligibilty in 3 minutes simple conversation with rare min A.    Baseline 75%    Time 10    Period --   sessions   Status On-going      SLP SHORT TERM GOAL #2   Title Pt will use 1 external compensatory memory aid to recall orientation information with minimal assistance.    Status Achieved      SLP SHORT TERM GOAL #3   Title Pt will sustain attention to basic task for 1 minute with moderate assistance.    Baseline Goal not met in reporting period, revised to reflect slow progress towards goal    Time 10    Period --   sessions   Status Revised              SLP Long Term Goals - 01/28/21 0827       SLP LONG TERM GOAL #1   Title Pt will use speech intelligibility strategies to achieve ~ 80% speech intelligiblity at the sentence level with intermittent supervision cues.    Status On-going    Target Date 03/04/21      SLP LONG TERM GOAL #2   Title Patient will identify cognitive-communication barriers and participate in developing functional compensatory strategies.    Status On-going    Target Date 03/04/21      SLP LONG TERM GOAL #3   Title Pt will complete cognitive communication HEP.    Status On-going    Target Date 03/04/21              Plan - 02/04/21 1150     Clinical Impression Statement Pt demonstrated incrased insight  into need to use compensatory memory strategies. Skilled ST intervention continues to be indicated to help pt utilize compensatory memory strategies to decrease response time and increase pt's ability to participate in social, leisure, community activities as well as reduce caregiver burden.    Speech Therapy Frequency 2x / week    Duration 8 weeks    Treatment/Interventions Compensatory techniques;Internal/external aids;Functional tasks;SLP instruction and feedback;Patient/family education    Potential to [place]  Potential Considerations Ability to learn/carryover information;Medical prognosis;Severity of impairments;Co-morbidities;Previous level of function    SLP Home Exercise Plan provided, see pt instructions section    Consulted and Agree with Plan of Care Patient;Family member/caregiver    Family Member Consulted [name]             Patient will benefit from skilled therapeutic intervention in order to improve the following deficits and impairments:   Cognitive communication deficit  Parkinson's disease ([place])  Dementia associated with Parkinson's disease ([place])    Problem List Patient Active Problem List   Diagnosis Date Noted   Sinus drainage 12/09/2020   Medicare annual wellness visit, subsequent 11/18/2020   Pacemaker    Dementia associated with Parkinson's disease ([place]) 06/17/2020   Vertigo 02/19/2020   Urine abnormality 02/19/2020   Dysphagia 02/23/2019   Fall at home 10/18/2017   REM behavioral disorder 11/02/2016   Radicular pain in right arm 10/09/2016   GERD (gastroesophageal reflux disease) 07/31/2016   Colon cancer screening 07/23/2015   Gout 02/18/2015   Depression 01/06/2015   Skin lesion 10/02/2014   Dupuytren's contracture 08/20/2014   Cough 07/21/2014   Lumbar stenosis with neurogenic claudication 06/13/2014   S/P deep brain stimulator placement 05/08/2014   Advance care planning 01/19/2014   Parkinson's disease ([place]) 12/13/2013    PTSD (post-traumatic stress disorder) 06/13/2013   Erectile dysfunction 06/13/2013   HLD (hyperlipidemia) 06/13/2013   Essential hypertension 06/03/2013   Bradycardia by electrocardiogram 06/03/2013   Obstructive sleep apnea 03/12/2013   Dayron Odland B. Rutherford Nail M.S., CCC-SLP, [place] Pathologist Rehabilitation Services Office [phone]  Stormy Fabian 02/04/2021, 11:54 AM  [place] MAIN [facility] SERVICES [address] [place], [place], [address] Phone: [phone]   Fax:  [phone]   Name: [name] MRN: [id number] Date of Birth: April 26, 1950

## 2021-02-08 ENCOUNTER — Ambulatory Visit: Payer: Medicare PPO | Admitting: Speech Pathology

## 2021-02-08 ENCOUNTER — Other Ambulatory Visit: Payer: Self-pay

## 2021-02-08 ENCOUNTER — Ambulatory Visit: Payer: Medicare PPO | Admitting: Physical Therapy

## 2021-02-08 DIAGNOSIS — R2681 Unsteadiness on feet: Secondary | ICD-10-CM | POA: Diagnosis not present

## 2021-02-08 DIAGNOSIS — R41841 Cognitive communication deficit: Secondary | ICD-10-CM

## 2021-02-08 DIAGNOSIS — R269 Unspecified abnormalities of gait and mobility: Secondary | ICD-10-CM | POA: Diagnosis not present

## 2021-02-08 DIAGNOSIS — R2689 Other abnormalities of gait and mobility: Secondary | ICD-10-CM | POA: Diagnosis not present

## 2021-02-08 DIAGNOSIS — R471 Dysarthria and anarthria: Secondary | ICD-10-CM | POA: Diagnosis not present

## 2021-02-08 DIAGNOSIS — R278 Other lack of coordination: Secondary | ICD-10-CM

## 2021-02-08 DIAGNOSIS — G20 Parkinson's disease: Secondary | ICD-10-CM

## 2021-02-08 DIAGNOSIS — R262 Difficulty in walking, not elsewhere classified: Secondary | ICD-10-CM | POA: Diagnosis not present

## 2021-02-08 DIAGNOSIS — R296 Repeated falls: Secondary | ICD-10-CM

## 2021-02-08 DIAGNOSIS — M6281 Muscle weakness (generalized): Secondary | ICD-10-CM

## 2021-02-08 DIAGNOSIS — F0280 Dementia in other diseases classified elsewhere without behavioral disturbance: Secondary | ICD-10-CM

## 2021-02-08 DIAGNOSIS — G8929 Other chronic pain: Secondary | ICD-10-CM

## 2021-02-08 DIAGNOSIS — M545 Low back pain, unspecified: Secondary | ICD-10-CM

## 2021-02-08 NOTE — Patient Instructions (Signed)
To promote increased speech intelligibility, pt to call this Probation officer with information regarding his upcoming [employer]

## 2021-02-08 NOTE — Therapy (Signed)
[place] MAIN [facility] SERVICES [address] [place], [place], [address] Phone: [phone]   Fax:  [phone]  Speech Language Pathology Treatment  Patient Details  Name: [name] MRN: [id number] Date of Birth: [birth year] Referring Provider (SLP): Elsie Stain   Encounter Date: 02/08/2021   End of Session - 02/08/21 1714     Visit Number 13    Number of Visits 17    Date for SLP Re-Evaluation 03/04/21    Authorization Type [employer] Choice PPO    Authorization Time Period 12/08/2020 thru 03/04/2021    Authorization - Visit Number 3    Progress Note Due on Visit 10    SLP Start Time 1145    SLP Stop Time  1230    SLP Time Calculation (min) 45 min    Activity Tolerance Patient tolerated treatment well             Past Medical History:  Diagnosis Date   Arthritis    Bradycardia    Cancer ([place]) 2013   skin cancer   Depression    ptsd   Dysrhythmia    chronic slow heart rate   GERD (gastroesophageal reflux disease)    Headache(784.0)    tension headaches non recent   History of chicken pox    History of kidney stones    passed   Hypertension    treated with HCTZ   Pacemaker    Parkinson's disease ([place])    dx'ed 15 years ago   PTSD (post-traumatic stress disorder)    Shortness of breath dyspnea    Sleep apnea    doesn't use C-pap   Varicose veins     Past Surgical History:  Procedure Laterality Date   CHOLECYSTECTOMY N/A 10/22/2014   Procedure: LAPAROSCOPIC CHOLECYSTECTOMY WITH INTRAOPERATIVE CHOLANGIOGRAM;  Surgeon: Dia Crawford III, MD;  Location: [facility] ORS;  Service: General;  Laterality: N/A;   cyst removed      from lip as a child   INTRAMEDULLARY (IM) NAIL INTERTROCHANTERIC N/A 02/18/2019   Procedure: INTRAMEDULLARY (IM) NAIL INTERTROCHANTRIC, RIGHT,;  Surgeon: Thornton Park, MD;  Location: [facility] ORS;  Service: Orthopedics;  Laterality: N/A;   LUMBAR LAMINECTOMY/DECOMPRESSION MICRODISCECTOMY  Bilateral 12/14/2012   Procedure: Bilateral lumbar three-four, four-five decompressive laminotomy/foraminotomy;  Surgeon: Charlie Pitter, MD;  Location: [place] [facility]UR[facility] ORS;  Service: Neurosurgery;  Laterality: Bilateral;   PULSE GENERATOR IMPLANT Bilateral 12/13/2013   Procedure: Bilateral implantable pulse generator placement;  Surgeon: Erline Levine, MD;  Location: [place] [facility]UR[facility] ORS;  Service: Neurosurgery;  Laterality: Bilateral;  Bilateral implantable pulse generator placement   skin cancer removed     from ears,   12 lft arm  rt leg 15   SUBTHALAMIC STIMULATOR BATTERY REPLACEMENT Bilateral 07/14/2017   Procedure: BILATERAL IMPLANTED PULSE GENERATOR CHANGE FOR DEEP BRAIN STIMULATOR;  Surgeon: Erline Levine, MD;  Location: [place];  Service: Neurosurgery;  Laterality: Bilateral;   SUBTHALAMIC STIMULATOR INSERTION Bilateral 12/06/2013   Procedure: SUBTHALAMIC STIMULATOR INSERTION;  Surgeon: Erline Levine, MD;  Location: [place] NEURO ORS;  Service: Neurosurgery;  Laterality: Bilateral;  Bilateral deep brain stimulator placement    There were no vitals filed for this visit.   Subjective Assessment - 02/08/21 1420     Subjective "I wanted to show you something"    Patient is accompained by: Family member    Currently in Pain? No/denies                   ADULT SLP  TREATMENT - 02/08/21 0001       Treatment Provided   Treatment provided Cognitive-Linquistic      Cognitive-Linquistic Treatment   Treatment focused on Cognition;Dysarthria;Patient/family/caregiver education    Skilled Treatment COGNITIVE COMMUNICATION: Pt was Mod I when using his notes to recall information, pt was Mod I when using iPad to location scripture with 100% accuracy; DYSARTHRIA: pt required moderate cues to repeat at the sentence level x 2 in 45 minute session d/t fast rate and imprecise articulation              SLP Education - 02/08/21 1425     Education Details speech intelligibility strategies, compensatory memory  strategies    Person(s) Educated Patient              SLP Short Term Goals - 02/08/21 1720       SLP SHORT TERM GOAL #1   Title Pt will maintain speech volume appropriate for 60% intelligibilty at the sentence level with minimal cues.    Baseline goal revised, 02/08/2021    Time 10    Period --   sessions   Status Revised      SLP SHORT TERM GOAL #2   Title Pt will use 1 external compensatory memory aid to recall functional information with minimal assistance.    Baseline goal revised, 02/08/2021    Time 10    Period --   sessions   Status Revised      SLP SHORT TERM GOAL #3   Title Pt will sustain attention to basic task for 1 minute with moderate assistance.    Time 10    Period --   sessions   Status On-going              SLP Long Term Goals - 02/08/21 1722       SLP LONG TERM GOAL #1   Title Pt will use speech intelligibility strategies to achieve ~ 80% speech intelligiblity at the sentence level with intermittent supervision cues.    Status On-going    Target Date 03/04/21      SLP LONG TERM GOAL #2   Title Patient will identify cognitive-communication barriers and participate in developing functional compensatory strategies.    Status On-going    Target Date 03/04/21      SLP LONG TERM GOAL #3   Title Pt will complete cognitive communication HEP.    Status On-going    Target Date 03/04/21              Plan - 02/08/21 1714     Clinical Impression Statement Pt demonstrated independent use of compensatory memory strategies to recall information. While pt continues to present with moderate to severe cognitive communication impairments, he is engaging in more cognitive activities throughout his day. Skilled ST intervention continues to be indicated to increase pt's cognitive communication ability by helping pt and his wife identify and overcome barriers to pt's independence.    Speech Therapy Frequency 2x / week    Duration 8 weeks     Treatment/Interventions Compensatory techniques;Internal/external aids;Functional tasks;SLP instruction and feedback;Patient/family education    Potential to Achieve Goals Fair    Potential Considerations Ability to learn/carryover information;Medical prognosis;Severity of impairments;Co-morbidities;Previous level of function    SLP Home Exercise Plan provided, see pt instructions section    Consulted and Agree with Plan of Care Patient;Family member/caregiver    Family Member Consulted [name]  Patient will benefit from skilled therapeutic intervention in order to improve the following deficits and impairments:   Parkinson's disease ([place])  Cognitive communication deficit  Dementia associated with Parkinson's disease [facility])    Problem List Patient Active Problem List   Diagnosis Date Noted   Sinus drainage 12/09/2020   Medicare annual wellness visit, subsequent 11/18/2020   Pacemaker    Dementia associated with Parkinson's disease ([place]) 06/17/2020   Vertigo 02/19/2020   Urine abnormality 02/19/2020   Dysphagia 02/23/2019   Fall at home 10/18/2017   REM behavioral disorder 11/02/2016   Radicular pain in right arm 10/09/2016   GERD (gastroesophageal reflux disease) 07/31/2016   Colon cancer screening 07/23/2015   Gout 02/18/2015   Depression 01/06/2015   Skin lesion 10/02/2014   Dupuytren's contracture 08/20/2014   Cough 07/21/2014   Lumbar stenosis with neurogenic claudication 06/13/2014   S/P deep brain stimulator placement 05/08/2014   Advance care planning 01/19/2014   Parkinson's disease ([place]) 12/13/2013   PTSD (post-traumatic stress disorder) 06/13/2013   Erectile dysfunction 06/13/2013   HLD (hyperlipidemia) 06/13/2013   Essential hypertension 06/03/2013   Bradycardia by electrocardiogram 06/03/2013   Obstructive sleep apnea 03/12/2013   Elverta Dimiceli B. Rutherford Nail M.S., CCC-SLP, [place] Pathologist Rehabilitation Services Office  [phone]  Stormy Fabian 02/08/2021, 5:24 PM  [place] MAIN [facility] SERVICES [address] [place], [place], [address] Phone: [phone]   Fax:  [phone]   Name: [name] MRN: [id number] Date of Birth: 1950-05-30

## 2021-02-08 NOTE — Therapy (Signed)
[place] MAIN [facility] SERVICES [address] [place], [place], [address] Phone: [phone]   Fax:  [phone]  Physical Therapy Treatment  Patient Details  Name: [name] MRN: [id number] Date of Birth: February 18, 1951 Referring Provider (PT): Elsie Stain, MD   Encounter Date: 02/08/2021   PT End of Session - 02/08/21 1130     Visit Number 15    Number of Visits 24    Date for PT Re-Evaluation 03/08/21    Authorization Type [employer] Medicare Choice PPO    Authorization Time Period recert for 8/31-51/76    Progress Note Due on Visit 10    PT Start Time 1101    PT Stop Time 1145    PT Time Calculation (min) 44 min    Equipment Utilized During Treatment Gait belt    Activity Tolerance Patient tolerated treatment well    Behavior During Therapy [facility] for tasks assessed/performed             Past Medical History:  Diagnosis Date   Arthritis    Bradycardia    Cancer ([place]) 2013   skin cancer   Depression    ptsd   Dysrhythmia    chronic slow heart rate   GERD (gastroesophageal reflux disease)    Headache(784.0)    tension headaches non recent   History of chicken pox    History of kidney stones    passed   Hypertension    treated with HCTZ   Pacemaker    Parkinson's disease ([place])    dx'ed 15 years ago   PTSD (post-traumatic stress disorder)    Shortness of breath dyspnea    Sleep apnea    doesn't use C-pap   Varicose veins     Past Surgical History:  Procedure Laterality Date   CHOLECYSTECTOMY N/A 10/22/2014   Procedure: LAPAROSCOPIC CHOLECYSTECTOMY WITH INTRAOPERATIVE CHOLANGIOGRAM;  Surgeon: Dia Crawford III, MD;  Location: [facility] ORS;  Service: General;  Laterality: N/A;   cyst removed      from lip as a child   INTRAMEDULLARY (IM) NAIL INTERTROCHANTERIC N/A 02/18/2019   Procedure: INTRAMEDULLARY (IM) NAIL INTERTROCHANTRIC, RIGHT,;  Surgeon: Thornton Park, MD;  Location: [facility] ORS;  Service: Orthopedics;  Laterality:  N/A;   LUMBAR LAMINECTOMY/DECOMPRESSION MICRODISCECTOMY Bilateral 12/14/2012   Procedure: Bilateral lumbar three-four, four-five decompressive laminotomy/foraminotomy;  Surgeon: Charlie Pitter, MD;  Location: [place] NEURO ORS;  Service: Neurosurgery;  Laterality: Bilateral;   PULSE GENERATOR IMPLANT Bilateral 12/13/2013   Procedure: Bilateral implantable pulse generator placement;  Surgeon: Erline Levine, MD;  Location: [place] NEURO ORS;  Service: Neurosurgery;  Laterality: Bilateral;  Bilateral implantable pulse generator placement   skin cancer removed     from ears,   12 lft arm  rt leg 15   SUBTHALAMIC STIMULATOR BATTERY REPLACEMENT Bilateral 07/14/2017   Procedure: BILATERAL IMPLANTED PULSE GENERATOR CHANGE FOR DEEP BRAIN STIMULATOR;  Surgeon: Erline Levine, MD;  Location: [place];  Service: Neurosurgery;  Laterality: Bilateral;   SUBTHALAMIC STIMULATOR INSERTION Bilateral 12/06/2013   Procedure: SUBTHALAMIC STIMULATOR INSERTION;  Surgeon: Erline Levine, MD;  Location: [place] NEURO ORS;  Service: Neurosurgery;  Laterality: Bilateral;  Bilateral deep brain stimulator placement    There were no vitals filed for this visit.   Subjective Assessment - 02/08/21 1114     Subjective Pt notes he is still having a considerable amount of LBP (7/10) and he and his wife endorse having a fall last night.  Pt notes he was attempting to stand  from the sink and he notes he was barefooted which didn't help.  Pt denies hitting his head of having any other symptoms from the fall.    Patient is accompained by: Family member    Pertinent History [name] is a 28yoM who comes to [facility] neuro for evaluation of conitnued difficulty of imbalance, unsteadiness on feet, and generally limited tolerance to mobility in the setting of chronic parkinsonism. SInce DC in February, pt has had PPM placement due to bradycardia issues. Pt also started using an UpWalker device 2 week prior to this evaluation, goal was to help with gradual melting  of postural extension that occured with sustained upright actiivty. Pt has continued to practice supervised AMB with caregivers, short distances out of house and in community. Pt continues to experience frequent falls on a weekly basis, at a rate that both he and wife report to be increasing in frequency. PMH: HTN, bradycardia s/p PPM, PD, DBS, COVID-19 infection, chronic low back pain s/p L4/5 surgery.    Limitations Walking;Standing;Sitting;House hold activities;Lifting    How long can you sit comfortably? Not limited    How long can you stand comfortably? 3-5 minutes with support    How long can you walk comfortably? ~5-6 minutes UpWalker Walker    Patient Stated Goals Reduce falls, improve AMB tolerance    Currently in Pain? Yes    Pain Score 7     Pain Location Back    Pain Orientation Lower    Pain Descriptors / Indicators Aching    Pain Type Chronic pain    Pain Onset More than a month ago                Interventions:    Moist heat to patient in sitting in chair (as patient was complaining of increased LBP)  while performing LE strengthening    Seated Hip march 5lb BE 2 x 15 reps Seated knee ext 5lb BLE 2 x 15 reps Seated Hip abd 5lb. BLE 2 x 15 reps Seated Hip flex/abd 5lb. 2x 15 reps.  Education provided throughout session via VC/TC and demonstration to facilitate movement at target joints and correct muscle activation for all testing and exercises performed.    Ambulation in the hallway with use of forearm rollator and focus on large, widened steps to prevent shuffling and scissoring, 4x38ft Ambulation without walker and focusing on increased step length and hip hike in order to prevent LE scuff, 2x12ft, with patient becoming significantly fatigued at conclusion   Pt rollator was serviced by therapist in order to increase usability and prevent falls when utilizing AD.  Rollator had several bolts that were loose and were tightened and reassessed following therapist  intervention.     Pt educated throughout session about proper posture and technique with exercises. Improved exercise technique, movement at target joints, use of target muscles after min to mod verbal, visual, tactile cues           PT Education - 02/08/21 1352     Education provided Yes    Education Details Pain relief and exercises techniques.    Person(s) Educated Patient;Spouse    Methods Explanation;Demonstration;Tactile cues;Verbal cues    Comprehension Verbalized understanding;Returned demonstration;Verbal cues required              PT Short Term Goals - 01/11/21 1010       PT SHORT TERM GOAL #1   Title Pt to demonstrate improved sustained AMB tolerance >824ft.    Baseline  At evlauation: 69ft tolerance (not to failure); 9/26: 1384 ft    Time 4    Period Weeks    Status Achieved    Target Date 01/05/21      PT SHORT TERM GOAL #2   Title Pt to demonstrate [place] >17/28 to show improvement in balance.    Baseline Eval: 15/28; 9/26: deferred    Time 4    Period Weeks    Status On-going    Target Date 02/08/21      PT SHORT TERM GOAL #3   Title Pt to demonstrate 10MWT<13sec to show self-selected gait speed appropriate for AMB outside of house and to improve energy efficiency.    Baseline UpWalker Eval: 17.53sec; 9/26: 0.74 m/s (13.5)    Time 4    Period Weeks    Status Partially Met    Target Date 02/08/21               PT Long Term Goals - 01/11/21 1030       PT LONG TERM GOAL #1   Title Pt to score MiniBesTest > 20/28 to show improved balance.    Baseline Eval: 15/28; 9/26: deferred    Time 8    Period Weeks    Status On-going    Target Date 03/08/21      PT LONG TERM GOAL #2   Title Pt to tolerate 1058ft sustained AMB with UpWalker without rest interval to improve safety and independence in limited community AMB.    Baseline At eval; tired after 655ft; 9/26: 1384 ft    Time 8    Period Weeks    Status Achieved      PT  LONG TERM GOAL #3   Title Pt to demonstrate improved FOTO score by 5 points compared to initital assessment.    Baseline 9/26: 49 (eval was 50)    Time 8    Period Weeks    Status On-going    Target Date 03/08/21      PT LONG TERM GOAL #4   Title Pt to show tolerance of AMB>1353ft with UpWalker to improve tolerance to accessing the community for IADL.    Baseline Eval: 673ft;  9/26: 1385    Time 12    Period Weeks    Status Achieved      PT LONG TERM GOAL #5   Title Patient will increase six minute walk test distance to >1000 for progression to community ambulator and improve gait ability    Baseline 9/26: new    Time 8    Period Weeks    Status New    Target Date 03/08/21                   Plan - 02/08/21 1353     Clinical Impression Statement Pt responded well to exercises today and self-reported increased security in AD following the tightening of the walker.  Pt and wife educated that it would be slightly more difficult to fold the rollator, but it was much more secure when ambulating with the walker.  Pt also did well with ambulation and no AD, however fatigues quickly.  Pt will continue to benefit from skilled therapy in order to increase QoL.    Personal Factors and Comorbidities Age;Past/Current Experience;Time since onset of injury/illness/exacerbation;Transportation    Comorbidities Chronic back pain, orthostatic hypotension, repeated falls, parkinsonism    Examination-Activity Limitations Bathing;Bed Mobility;Caring for Others;Bend;Dressing;Lift;Locomotion Level;Reach Overhead;Sit;Squat;Stairs;Stand;Toileting;Transfers;Carry    Examination-Participation Restrictions Cleaning;Community Activity;Interpersonal Relationship;Shop;Volunteer;Yard Work;Other;Driving  Stability/Clinical Decision Making Evolving/Moderate complexity    Rehab Potential Fair    PT Frequency 2x / week    PT Duration 12 weeks    PT Treatment/Interventions ADLs/Self Care Home  Management;Aquatic Therapy;Cryotherapy;Electrical Stimulation;Iontophoresis 4mg /ml Dexamethasone;Moist Heat;Ultrasound;Contrast Bath;DME Instruction;Gait training;Stair training;Functional mobility training;Therapeutic activities;Therapeutic exercise;Balance training;Neuromuscular re-education;Patient/family education;Manual techniques;Wheelchair mobility training;Energy conservation;Passive range of motion;Traction;Orthotic Fit/Training;Dry needling;Vestibular;Visual/perceptual remediation/compensation;Taping;Canalith Repostioning;Cognitive remediation    PT Next Visit Plan Advanced AMB tolerance, use MiniBesTest to guide balance interventions seated rotation and balance exercises    PT Home Exercise Plan None set up at evaluation, still working on previous HEP from 6 months prior (STS, daily walking); 8/29: issued STS with focus on large amplitude movement at support surface; 9/12: PT provides handout with sit to stand exercise and large amplitude seated trunk rotation exercise.  9/19: reinforced HEP, provided handout for seated postural exercise with UE abduction, and seated exercise with trunk twists and UE reach, 10x for each, can be performed daily or minimum of 4x/week. No changes today.    Consulted and Agree with Plan of Care Patient;Family member/caregiver    Family Member Consulted Wife             Patient will benefit from skilled therapeutic intervention in order to improve the following deficits and impairments:  Abnormal gait, Decreased balance, Decreased coordination, Decreased mobility, Postural dysfunction, Decreased strength, Decreased safety awareness, Improper body mechanics, Impaired flexibility, Decreased activity tolerance, Decreased endurance, Decreased knowledge of precautions, Difficulty walking, Pain, Cardiopulmonary status limiting activity, Decreased range of motion, Impaired perceived functional ability, Decreased cognition, Dizziness  Visit Diagnosis: Difficulty in  walking, not elsewhere classified  Muscle weakness (generalized)  Parkinson's disease ([facility])  Abnormality of gait and mobility  Unsteadiness on feet  Other lack of coordination  Repeated falls  Difficulty walking  Chronic low back pain, unspecified back pain laterality, unspecified whether sciatica present  Other abnormalities of gait and mobility     Problem List Patient Active Problem List   Diagnosis Date Noted   Sinus drainage 12/09/2020   Medicare annual wellness visit, subsequent 11/18/2020   Pacemaker    Dementia associated with Parkinson's disease ([place]) 06/17/2020   Vertigo 02/19/2020   Urine abnormality 02/19/2020   Dysphagia 02/23/2019   Fall at home 10/18/2017   REM behavioral disorder 11/02/2016   Radicular pain in right arm 10/09/2016   GERD (gastroesophageal reflux disease) 07/31/2016   Colon cancer screening 07/23/2015   Gout 02/18/2015   Depression 01/06/2015   Skin lesion 10/02/2014   Dupuytren's contracture 08/20/2014   Cough 07/21/2014   Lumbar stenosis with neurogenic claudication 06/13/2014   S/P deep brain stimulator placement 05/08/2014   Advance care planning 01/19/2014   Parkinson's disease ([place]) 12/13/2013   PTSD (post-traumatic stress disorder) 06/13/2013   Erectile dysfunction 06/13/2013   HLD (hyperlipidemia) 06/13/2013   Essential hypertension 06/03/2013   Bradycardia by electrocardiogram 06/03/2013   Obstructive sleep apnea 03/12/2013    Gwenlyn Saran, PT, DPT 02/08/21, 2:04 PM    Christie Nottingham, PT 02/08/2021, 2:04 PM  [place] MAIN [facility] SERVICES [address] [place], [place], [address] Phone: [phone]   Fax:  [phone]  Name: DEMAREON COLDWELL MRN: [id number] Date of Birth: 1950-12-21

## 2021-02-10 ENCOUNTER — Ambulatory Visit: Payer: Medicare PPO | Admitting: Physical Therapy

## 2021-02-10 ENCOUNTER — Ambulatory Visit: Payer: Medicare PPO | Admitting: Speech Pathology

## 2021-02-15 ENCOUNTER — Ambulatory Visit: Payer: Medicare PPO | Admitting: Physical Therapy

## 2021-02-15 ENCOUNTER — Ambulatory Visit (INDEPENDENT_AMBULATORY_CARE_PROVIDER_SITE_OTHER): Payer: Medicare PPO | Admitting: Psychology

## 2021-02-15 ENCOUNTER — Ambulatory Visit: Payer: Medicare PPO | Admitting: Speech Pathology

## 2021-02-15 ENCOUNTER — Other Ambulatory Visit: Payer: Self-pay

## 2021-02-15 ENCOUNTER — Encounter: Payer: Self-pay | Admitting: Physical Therapy

## 2021-02-15 DIAGNOSIS — R296 Repeated falls: Secondary | ICD-10-CM | POA: Diagnosis not present

## 2021-02-15 DIAGNOSIS — R471 Dysarthria and anarthria: Secondary | ICD-10-CM

## 2021-02-15 DIAGNOSIS — F4323 Adjustment disorder with mixed anxiety and depressed mood: Secondary | ICD-10-CM

## 2021-02-15 DIAGNOSIS — R269 Unspecified abnormalities of gait and mobility: Secondary | ICD-10-CM

## 2021-02-15 DIAGNOSIS — R2689 Other abnormalities of gait and mobility: Secondary | ICD-10-CM | POA: Diagnosis not present

## 2021-02-15 DIAGNOSIS — F331 Major depressive disorder, recurrent, moderate: Secondary | ICD-10-CM

## 2021-02-15 DIAGNOSIS — G20 Parkinson's disease: Secondary | ICD-10-CM

## 2021-02-15 DIAGNOSIS — R2681 Unsteadiness on feet: Secondary | ICD-10-CM | POA: Diagnosis not present

## 2021-02-15 DIAGNOSIS — R262 Difficulty in walking, not elsewhere classified: Secondary | ICD-10-CM | POA: Diagnosis not present

## 2021-02-15 DIAGNOSIS — R41841 Cognitive communication deficit: Secondary | ICD-10-CM

## 2021-02-15 DIAGNOSIS — M6281 Muscle weakness (generalized): Secondary | ICD-10-CM | POA: Diagnosis not present

## 2021-02-15 DIAGNOSIS — R278 Other lack of coordination: Secondary | ICD-10-CM | POA: Diagnosis not present

## 2021-02-15 DIAGNOSIS — F0280 Dementia in other diseases classified elsewhere without behavioral disturbance: Secondary | ICD-10-CM

## 2021-02-15 NOTE — Patient Instructions (Signed)
Continue practicing trained strategies in the home environment

## 2021-02-15 NOTE — Therapy (Signed)
[place] MAIN [facility] SERVICES [address] [place], [place], [address] Phone: [phone]   Fax:  [phone]  Physical Therapy Treatment  Patient Details  Name: [name] MRN: [id number] Date of Birth: 03/27/1951 Referring Provider (PT): Elsie Stain, MD   Encounter Date: 02/15/2021   PT End of Session - 02/15/21 1355     Visit Number 16    Number of Visits 24    Date for PT Re-Evaluation 03/08/21    Authorization Type [employer] Medicare Choice PPO    Authorization Time Period recert for 0/27-25/36    Progress Note Due on Visit 10    PT Start Time 1101    PT Stop Time 1144    PT Time Calculation (min) 43 min    Equipment Utilized During Treatment Gait belt    Activity Tolerance Patient tolerated treatment well    Behavior During Therapy [facility] for tasks assessed/performed             Past Medical History:  Diagnosis Date   Arthritis    Bradycardia    Cancer ([place]) 2013   skin cancer   Depression    ptsd   Dysrhythmia    chronic slow heart rate   GERD (gastroesophageal reflux disease)    Headache(784.0)    tension headaches non recent   History of chicken pox    History of kidney stones    passed   Hypertension    treated with HCTZ   Pacemaker    Parkinson's disease ([place])    dx'ed 15 years ago   PTSD (post-traumatic stress disorder)    Shortness of breath dyspnea    Sleep apnea    doesn't use C-pap   Varicose veins     Past Surgical History:  Procedure Laterality Date   CHOLECYSTECTOMY N/A 10/22/2014   Procedure: LAPAROSCOPIC CHOLECYSTECTOMY WITH INTRAOPERATIVE CHOLANGIOGRAM;  Surgeon: Dia Crawford III, MD;  Location: [facility] ORS;  Service: General;  Laterality: N/A;   cyst removed      from lip as a child   INTRAMEDULLARY (IM) NAIL INTERTROCHANTERIC N/A 02/18/2019   Procedure: INTRAMEDULLARY (IM) NAIL INTERTROCHANTRIC, RIGHT,;  Surgeon: Thornton Park, MD;  Location: [facility] ORS;  Service: Orthopedics;  Laterality:  N/A;   LUMBAR LAMINECTOMY/DECOMPRESSION MICRODISCECTOMY Bilateral 12/14/2012   Procedure: Bilateral lumbar three-four, four-five decompressive laminotomy/foraminotomy;  Surgeon: Charlie Pitter, MD;  Location: [place] NEURO ORS;  Service: Neurosurgery;  Laterality: Bilateral;   PULSE GENERATOR IMPLANT Bilateral 12/13/2013   Procedure: Bilateral implantable pulse generator placement;  Surgeon: Erline Levine, MD;  Location: [place] NEURO ORS;  Service: Neurosurgery;  Laterality: Bilateral;  Bilateral implantable pulse generator placement   skin cancer removed     from ears,   12 lft arm  rt leg 15   SUBTHALAMIC STIMULATOR BATTERY REPLACEMENT Bilateral 07/14/2017   Procedure: BILATERAL IMPLANTED PULSE GENERATOR CHANGE FOR DEEP BRAIN STIMULATOR;  Surgeon: Erline Levine, MD;  Location: [place];  Service: Neurosurgery;  Laterality: Bilateral;   SUBTHALAMIC STIMULATOR INSERTION Bilateral 12/06/2013   Procedure: SUBTHALAMIC STIMULATOR INSERTION;  Surgeon: Erline Levine, MD;  Location: [place] NEURO ORS;  Service: Neurosurgery;  Laterality: Bilateral;  Bilateral deep brain stimulator placement    There were no vitals filed for this visit.   Subjective Assessment - 02/15/21 1102     Subjective Pt notes he is still having LBP (4/10) which seems to be limiting physical activity. Denies falls. Cinamet dosage has been increased since previous session.    Patient is accompained  by: Family member    Pertinent History [name] is a 37yoM who [facility] neuro for evaluation of conitnued difficulty of imbalance, unsteadiness on feet, and generally limited tolerance to mobility in the setting of chronic parkinsonism. SInce DC in February, pt has had PPM placement due to bradycardia issues. Pt also started using an UpWalker device 2 week prior to this evaluation, goal was to help with gradual melting of postural extension that occured with sustained upright actiivty. Pt has continued to practice supervised AMB with caregivers,  short distances out of house and in community. Pt continues to experience frequent falls on a weekly basis, at a rate that both he and wife report to be increasing in frequency. PMH: HTN, bradycardia s/p PPM, PD, DBS, COVID-19 infection, chronic low back pain s/p L4/5 surgery.    Limitations Walking;Standing;Sitting;House hold activities;Lifting    How long can you sit comfortably? Not limited    How long can you stand comfortably? 3-5 minutes with support    How long can you walk comfortably? ~5-6 minutes UpWalker Walker    Patient Stated Goals Reduce falls, improve AMB tolerance    Currently in Pain? Yes    Pain Score 4     Pain Location Back    Pain Orientation Lower    Pain Descriptors / Indicators Aching    Pain Type Chronic pain    Pain Onset More than a month ago    Pain Onset More than a month ago                Interventions:    Moist heat to patient in sitting in chair while performing LE strengthening    Seated Hip march 5lb BE 2 x 15 reps Seated knee ext 5lb BLE 2 x 15 reps Seated Hip abd GTB BLE 2 x 15 reps Seated Hip flex/abd (over hedgehog) 5lb. 2x 15 reps.  Education provided throughout session via VC/TC and demonstration to facilitate movement at target joints and correct muscle activation for all testing and exercises performed.   Combined movement patterns:  Sit to stand without UE support from straight back chair, blue airex pad in chair- followed by horizontal shoulder ABD with BTB 2 x 15 reps. Posterior LOB x2 occasions with PT VC to sit down and reset before next rep.  Row, seated with BTB 2 x 15 reps. VC on technique.  Shoulder extension in standing with BTB 2 x 15 reps. Increased A-P sway due to varying resistance of band, CGA to steady.    Lateral stepping at support bar - cued for 2 steps in each direction for goal of increased step length. 1 x 10 BLE. Added hedgehog to increase vertical height of step, 1 x 10 BLE.      Pt educated throughout  session about proper posture and technique with exercises. Improved exercise technique, movement at target joints, use of target muscles after min to mod verbal, visual, tactile cues    Clinical Impression: Pt demonstrated excellent motivation throughout session today. Pt was able to tolerate increased standing tolerance during exercises today. Lateral stepping was initiated, minimal foot clearance with first set - added headgehog to encourage increased step height with good feedback. Range of movement within sets (all exercises) did progressively decrease, correlating with reported fatigue.  Pt will continue to benefit from skilled therapy in order to increase QoL         PT Short Term Goals - 01/11/21 1010  PT SHORT TERM GOAL #1   Title Pt to demonstrate improved sustained AMB tolerance >841ft.    Baseline At evlauation: 656ft tolerance (not to failure); 9/26: 1384 ft    Time 4    Period Weeks    Status Achieved    Target Date 01/05/21      PT SHORT TERM GOAL #2   Title Pt to demonstrate [place] >17/28 to show improvement in balance.    Baseline Eval: 15/28; 9/26: deferred    Time 4    Period Weeks    Status On-going    Target Date 02/08/21      PT SHORT TERM GOAL #3   Title Pt to demonstrate 10MWT<13sec to show self-selected gait speed appropriate for AMB outside of house and to improve energy efficiency.    Baseline UpWalker Eval: 17.53sec; 9/26: 0.74 m/s (13.5)    Time 4    Period Weeks    Status Partially Met    Target Date 02/08/21               PT Long Term Goals - 01/11/21 1030       PT LONG TERM GOAL #1   Title Pt to score MiniBesTest > 20/28 to show improved balance.    Baseline Eval: 15/28; 9/26: deferred    Time 8    Period Weeks    Status On-going    Target Date 03/08/21      PT LONG TERM GOAL #2   Title Pt to tolerate 1042ft sustained AMB with UpWalker without rest interval to improve safety and independence in limited community  AMB.    Baseline At eval; tired after 641ft; 9/26: 1384 ft    Time 8    Period Weeks    Status Achieved      PT LONG TERM GOAL #3   Title Pt to demonstrate improved FOTO score by 5 points compared to initital assessment.    Baseline 9/26: 49 (eval was 50)    Time 8    Period Weeks    Status On-going    Target Date 03/08/21      PT LONG TERM GOAL #4   Title Pt to show tolerance of AMB>1360ft with UpWalker to improve tolerance to accessing the community for IADL.    Baseline Eval: 673ft;  9/26: 1385    Time 12    Period Weeks    Status Achieved      PT LONG TERM GOAL #5   Title Patient will increase six minute walk test distance to >1000 for progression to community ambulator and improve gait ability    Baseline 9/26: new    Time 8    Period Weeks    Status New    Target Date 03/08/21                   Plan - 02/15/21 1356     Clinical Impression Statement Pt demonstrated excellent motivation throughout session today. Pt was able to tolerate increased standing tolerance during exercises today. Lateral stepping was initiated, minimal foot clearance with first set - added headgehog to encourage increased step height with good feedback. Range of movement within sets (all exercises) did progressively decrease, correlating with reported fatigue.  Pt will continue to benefit from skilled therapy in order to increase QoL.    Personal Factors and Comorbidities Age;Past/Current Experience;Time since onset of injury/illness/exacerbation;Transportation    Comorbidities Chronic back pain, orthostatic hypotension, repeated falls, parkinsonism    Examination-Activity Limitations Bathing;Bed  Mobility;Caring for Others;Bend;Dressing;Lift;Locomotion Level;Reach Overhead;Sit;Squat;Stairs;Stand;Toileting;Transfers;Carry    Examination-Participation Restrictions Cleaning;Community Activity;Interpersonal Relationship;Shop;Volunteer;Yard Work;Other;Driving    Stability/Clinical Decision Making  Evolving/Moderate complexity    Rehab Potential Fair    PT Frequency 2x / week    PT Duration 12 weeks    PT Treatment/Interventions ADLs/Self Care Home Management;Aquatic Therapy;Cryotherapy;Electrical Stimulation;Iontophoresis 4mg /ml Dexamethasone;Moist Heat;Ultrasound;Contrast Bath;DME Instruction;Gait training;Stair training;Functional mobility training;Therapeutic activities;Therapeutic exercise;Balance training;Neuromuscular re-education;Patient/family education;Manual techniques;Wheelchair mobility training;Energy conservation;Passive range of motion;Traction;Orthotic Fit/Training;Dry needling;Vestibular;Visual/perceptual remediation/compensation;Taping;Canalith Repostioning;Cognitive remediation    PT Next Visit Plan Advanced AMB tolerance, use MiniBesTest to guide balance interventions seated rotation and balance exercises    PT Home Exercise Plan None set up at evaluation, still working on previous HEP from 6 months prior (STS, daily walking); 8/29: issued STS with focus on large amplitude movement at support surface; 9/12: PT provides handout with sit to stand exercise and large amplitude seated trunk rotation exercise.  9/19: reinforced HEP, provided handout for seated postural exercise with UE abduction, and seated exercise with trunk twists and UE reach, 10x for each, can be performed daily or minimum of 4x/week. No changes today.    Consulted and Agree with Plan of Care Patient;Family member/caregiver    Family Member Consulted Wife             Patient will benefit from skilled therapeutic intervention in order to improve the following deficits and impairments:  Abnormal gait, Decreased balance, Decreased coordination, Decreased mobility, Postural dysfunction, Decreased strength, Decreased safety awareness, Improper body mechanics, Impaired flexibility, Decreased activity tolerance, Decreased endurance, Decreased knowledge of precautions, Difficulty walking, Pain, Cardiopulmonary  status limiting activity, Decreased range of motion, Impaired perceived functional ability, Decreased cognition, Dizziness  Visit Diagnosis: Abnormality of gait and mobility  Other abnormalities of gait and mobility  Difficulty in walking, not elsewhere classified  Other lack of coordination  Muscle weakness (generalized)  Unsteadiness on feet     Problem List Patient Active Problem List   Diagnosis Date Noted   Sinus drainage 12/09/2020   Medicare annual wellness visit, subsequent 11/18/2020   Pacemaker    Dementia associated with Parkinson's disease ([place]) 06/17/2020   Vertigo 02/19/2020   Urine abnormality 02/19/2020   Dysphagia 02/23/2019   Fall at home 10/18/2017   REM behavioral disorder 11/02/2016   Radicular pain in right arm 10/09/2016   GERD (gastroesophageal reflux disease) 07/31/2016   Colon cancer screening 07/23/2015   Gout 02/18/2015   Depression 01/06/2015   Skin lesion 10/02/2014   Dupuytren's contracture 08/20/2014   Cough 07/21/2014   Lumbar stenosis with neurogenic claudication 06/13/2014   S/P deep brain stimulator placement 05/08/2014   Advance care planning 01/19/2014   Parkinson's disease ([place]) 12/13/2013   PTSD (post-traumatic stress disorder) 06/13/2013   Erectile dysfunction 06/13/2013   HLD (hyperlipidemia) 06/13/2013   Essential hypertension 06/03/2013   Bradycardia by electrocardiogram 06/03/2013   Obstructive sleep apnea 03/12/2013    Patrina Levering PT, DPT  [place] [address] [place], [place], [address] Phone: [phone]   Fax:  [phone]  Name: [name] MRN: [id number] Date of Birth: 04-02-1951

## 2021-02-15 NOTE — Therapy (Signed)
[place] MAIN [facility] SERVICES [address] [place], [place], [address] Phone: [phone]   Fax:  [phone]  Speech Language Pathology Treatment  Patient Details  Name: [name] MRN: [id number] Date of Birth: [birth year] Referring Provider (SLP): Elsie Stain   Encounter Date: 02/15/2021   End of Session - 02/15/21 1651     Visit Number 14    Number of Visits 17    Date for SLP Re-Evaluation 03/04/21    Authorization Type [employer] Medicare Choice PPO    Authorization Time Period 12/08/2020 thru 03/04/2021    Authorization - Visit Number 4    Progress Note Due on Visit 10    SLP Start Time 1000    SLP Stop Time  1100    SLP Time Calculation (min) 60 min    Activity Tolerance Patient tolerated treatment well             Past Medical History:  Diagnosis Date   Arthritis    Bradycardia    Cancer ([place]) 2013   skin cancer   Depression    ptsd   Dysrhythmia    chronic slow heart rate   GERD (gastroesophageal reflux disease)    Headache(784.0)    tension headaches non recent   History of chicken pox    History of kidney stones    passed   Hypertension    treated with HCTZ   Pacemaker    Parkinson's disease ([place])    dx'ed 15 years ago   PTSD (post-traumatic stress disorder)    Shortness of breath dyspnea    Sleep apnea    doesn't use C-pap   Varicose veins     Past Surgical History:  Procedure Laterality Date   CHOLECYSTECTOMY N/A 10/22/2014   Procedure: LAPAROSCOPIC CHOLECYSTECTOMY WITH INTRAOPERATIVE CHOLANGIOGRAM;  Surgeon: Dia Crawford III, MD;  Location: [facility] ORS;  Service: General;  Laterality: N/A;   cyst removed      from lip as a child   INTRAMEDULLARY (IM) NAIL INTERTROCHANTERIC N/A 02/18/2019   Procedure: INTRAMEDULLARY (IM) NAIL INTERTROCHANTRIC, RIGHT,;  Surgeon: Thornton Park, MD;  Location: [facility] ORS;  Service: Orthopedics;  Laterality: N/A;   LUMBAR LAMINECTOMY/DECOMPRESSION MICRODISCECTOMY  Bilateral 12/14/2012   Procedure: Bilateral lumbar three-four, four-five decompressive laminotomy/foraminotomy;  Surgeon: Charlie Pitter, MD;  Location: [place] NEURO ORS;  Service: Neurosurgery;  Laterality: Bilateral;   PULSE GENERATOR IMPLANT Bilateral 12/13/2013   Procedure: Bilateral implantable pulse generator placement;  Surgeon: Erline Levine, MD;  Location: [place] NEURO ORS;  Service: Neurosurgery;  Laterality: Bilateral;  Bilateral implantable pulse generator placement   skin cancer removed     from ears,   12 lft arm  rt leg 15   SUBTHALAMIC STIMULATOR BATTERY REPLACEMENT Bilateral 07/14/2017   Procedure: BILATERAL IMPLANTED PULSE GENERATOR CHANGE FOR DEEP BRAIN STIMULATOR;  Surgeon: Erline Levine, MD;  Location: [place];  Service: Neurosurgery;  Laterality: Bilateral;   SUBTHALAMIC STIMULATOR INSERTION Bilateral 12/06/2013   Procedure: SUBTHALAMIC STIMULATOR INSERTION;  Surgeon: Erline Levine, MD;  Location: [place] NEURO ORS;  Service: Neurosurgery;  Laterality: Bilateral;  Bilateral deep brain stimulator placement    There were no vitals filed for this visit.   Subjective Assessment - 02/15/21 1643     Subjective Patient pleasantly motivated to participate in therapy. Spouse [name] present and encouraging throghout session.    Patient is accompained by: Family member    Currently in Pain? No/denies  ADULT SLP TREATMENT - 02/15/21 0001       Treatment Provided   Treatment provided Cognitive-Linquistic      Cognitive-Linquistic Treatment   Treatment focused on Cognition;Dysarthria    Skilled Treatment COGNITIVE COMMUNICATION: Pt was Mod I for recall of recent functional information of personal events, improved with written notes to reference with 80% accuracy IND x5. DYSARTHRIA: patient demonstrated clear intelligible speech at conversational level with 70% accuracy x20 sentence level utterances. Improved with structured reading task with cues to increase volume and over  articulate with 95% accuracy x20 read utterances. Patient demonstrated improvement in intelligibility wiht carryover of strategies from reading to again conversational responses with 85% accuracy (from initial 70%).              SLP Education - 02/15/21 1651     Education Details speech intelligibility strategies, compensatory memory strategies    Person(s) Educated Patient;Spouse    Methods Explanation;Demonstration    Comprehension Verbalized understanding;Need further instruction              SLP Short Term Goals - 02/08/21 1720       SLP SHORT TERM GOAL #1   Title Pt will maintain speech volume appropriate for 60% intelligibilty at the sentence level with minimal cues.    Baseline goal revised, 02/08/2021    Time 10    Period --   sessions   Status Revised      SLP SHORT TERM GOAL #2   Title Pt will use 1 external compensatory memory aid to recall functional information with minimal assistance.    Baseline goal revised, 02/08/2021    Time 10    Period --   sessions   Status Revised      SLP SHORT TERM GOAL #3   Title Pt will sustain attention to basic task for 1 minute with moderate assistance.    Time 10    Period --   sessions   Status On-going              SLP Long Term Goals - 02/08/21 1722       SLP LONG TERM GOAL #1   Title Pt will use speech intelligibility strategies to achieve ~ 80% speech intelligiblity at the sentence level with intermittent supervision cues.    Status On-going    Target Date 03/04/21      SLP LONG TERM GOAL #2   Title Patient will identify cognitive-communication barriers and participate in developing functional compensatory strategies.    Status On-going    Target Date 03/04/21      SLP LONG TERM GOAL #3   Title Pt will complete cognitive communication HEP.    Status On-going    Target Date 03/04/21              Plan - 02/15/21 1652     Clinical Impression Statement Pt demonstrated independent use of  compensatory memory strategies to recall information. While pt continues to present with moderate to severe cognitive communication impairments, he is engaging in more cognitive activities throughout his day. Patient demonstrated improvement in speech intelligibility for functional communication with cues for increased volume and over articulation, notably patient more challenged to carry over strategies from structured responses to unstructured conversational speech. Further patient is unable to identify errors in speech requiring SLP cues to utilize strategies to rectify communcation breakdowns. Skilled ST intervention continues to be indicated to increase pt's cognitive communication ability and speech by helping pt and his wife identify  and overcome barriers to pt's independence.    Speech Therapy Frequency 2x / week    Duration 8 weeks    Treatment/Interventions Compensatory techniques;Internal/external aids;Functional tasks;SLP instruction and feedback;Patient/family education    Potential Considerations Ability to learn/carryover information;Medical prognosis;Severity of impairments;Co-morbidities;Previous level of function    SLP Home Exercise Plan provided, see pt instructions section    Consulted and Agree with Plan of Care Patient;Family member/caregiver    Family Member Consulted [name]             Patient will benefit from skilled therapeutic intervention in order to improve the following deficits and impairments:   Dysarthria and anarthria  Dementia associated with Parkinson's disease ([place])  Cognitive communication deficit    Problem List Patient Active Problem List   Diagnosis Date Noted   Sinus drainage 12/09/2020   Medicare annual wellness visit, subsequent 11/18/2020   Pacemaker    Dementia associated with Parkinson's disease ([place]) 06/17/2020   Vertigo 02/19/2020   Urine abnormality 02/19/2020   Dysphagia 02/23/2019   Fall at home 10/18/2017   REM behavioral  disorder 11/02/2016   Radicular pain in right arm 10/09/2016   GERD (gastroesophageal reflux disease) 07/31/2016   Colon cancer screening 07/23/2015   Gout 02/18/2015   Depression 01/06/2015   Skin lesion 10/02/2014   Dupuytren's contracture 08/20/2014   Cough 07/21/2014   Lumbar stenosis with neurogenic claudication 06/13/2014   S/P deep brain stimulator placement 05/08/2014   Advance care planning 01/19/2014   Parkinson's disease ([place]) 12/13/2013   PTSD (post-traumatic stress disorder) 06/13/2013   Erectile dysfunction 06/13/2013   HLD (hyperlipidemia) 06/13/2013   Essential hypertension 06/03/2013   Bradycardia by electrocardiogram 06/03/2013   Obstructive sleep apnea 03/12/2013   [place] L. Weslee Fogg, M.A. [place] [phone]  Dalbert Batman 02/15/2021, 4:56 PM  [place] MAIN [facility] SERVICES [address] [place], [place], [address] Phone: [phone]   Fax:  [phone]   Name: [name] MRN: [id number] Date of Birth: 02-21-1951

## 2021-02-17 ENCOUNTER — Ambulatory Visit: Payer: Medicare PPO | Attending: Family Medicine | Admitting: Physical Therapy

## 2021-02-17 ENCOUNTER — Encounter: Payer: Self-pay | Admitting: Physical Therapy

## 2021-02-17 ENCOUNTER — Other Ambulatory Visit: Payer: Self-pay

## 2021-02-17 DIAGNOSIS — F0280 Dementia in other diseases classified elsewhere without behavioral disturbance: Secondary | ICD-10-CM | POA: Insufficient documentation

## 2021-02-17 DIAGNOSIS — R2689 Other abnormalities of gait and mobility: Secondary | ICD-10-CM | POA: Insufficient documentation

## 2021-02-17 DIAGNOSIS — R296 Repeated falls: Secondary | ICD-10-CM | POA: Insufficient documentation

## 2021-02-17 DIAGNOSIS — M6281 Muscle weakness (generalized): Secondary | ICD-10-CM | POA: Diagnosis not present

## 2021-02-17 DIAGNOSIS — R41841 Cognitive communication deficit: Secondary | ICD-10-CM | POA: Insufficient documentation

## 2021-02-17 DIAGNOSIS — R269 Unspecified abnormalities of gait and mobility: Secondary | ICD-10-CM | POA: Diagnosis not present

## 2021-02-17 DIAGNOSIS — G20 Parkinson's disease: Secondary | ICD-10-CM | POA: Diagnosis not present

## 2021-02-17 DIAGNOSIS — R278 Other lack of coordination: Secondary | ICD-10-CM | POA: Insufficient documentation

## 2021-02-17 DIAGNOSIS — R2681 Unsteadiness on feet: Secondary | ICD-10-CM | POA: Diagnosis not present

## 2021-02-17 DIAGNOSIS — R262 Difficulty in walking, not elsewhere classified: Secondary | ICD-10-CM | POA: Insufficient documentation

## 2021-02-17 NOTE — Therapy (Signed)
[place] MAIN [facility] SERVICES [address] [place], [place], [address] Phone: [phone]   Fax:  [phone]  Physical Therapy Treatment  Patient Details  Name: [name] MRN: [id number] Date of Birth: 19-Apr-1950 Referring Provider (PT): Elsie Stain, MD   Encounter Date: 02/17/2021   PT End of Session - 02/17/21 1404     Visit Number 17    Number of Visits 24    Date for PT Re-Evaluation 03/08/21    Authorization Type [employer] Choice PPO    Authorization Time Period recert for 0/07-62/26    Progress Note Due on Visit 10    PT Start Time 1106    PT Stop Time 1146    PT Time Calculation (min) 40 min    Equipment Utilized During Treatment Gait belt    Activity Tolerance Patient tolerated treatment well    Behavior During Therapy [facility] for tasks assessed/performed             Past Medical History:  Diagnosis Date   Arthritis    Bradycardia    Cancer ([place]) 2013   skin cancer   Depression    ptsd   Dysrhythmia    chronic slow heart rate   GERD (gastroesophageal reflux disease)    Headache(784.0)    tension headaches non recent   History of chicken pox    History of kidney stones    passed   Hypertension    treated with HCTZ   Pacemaker    Parkinson's disease ([place])    dx'ed 15 years ago   PTSD (post-traumatic stress disorder)    Shortness of breath dyspnea    Sleep apnea    doesn't use C-pap   Varicose veins     Past Surgical History:  Procedure Laterality Date   CHOLECYSTECTOMY N/A 10/22/2014   Procedure: LAPAROSCOPIC CHOLECYSTECTOMY WITH INTRAOPERATIVE CHOLANGIOGRAM;  Surgeon: Dia Crawford III, MD;  Location: [facility] ORS;  Service: General;  Laterality: N/A;   cyst removed      from lip as a child   INTRAMEDULLARY (IM) NAIL INTERTROCHANTERIC N/A 02/18/2019   Procedure: INTRAMEDULLARY (IM) NAIL INTERTROCHANTRIC, RIGHT,;  Surgeon: Thornton Park, MD;  Location: [facility] ORS;  Service: Orthopedics;  Laterality:  N/A;   LUMBAR LAMINECTOMY/DECOMPRESSION MICRODISCECTOMY Bilateral 12/14/2012   Procedure: Bilateral lumbar three-four, four-five decompressive laminotomy/foraminotomy;  Surgeon: Charlie Pitter, MD;  Location: [place] [facility] ORS;  Service: Neurosurgery;  Laterality: Bilateral;   PULSE GENERATOR IMPLANT Bilateral 12/13/2013   Procedure: Bilateral implantable pulse generator placement;  Surgeon: Erline Levine, MD;  Location: [place] [facility] ORS;  Service: Neurosurgery;  Laterality: Bilateral;  Bilateral implantable pulse generator placement   skin cancer removed     from ears,   12 lft arm  rt leg 15   SUBTHALAMIC STIMULATOR BATTERY REPLACEMENT Bilateral 07/14/2017   Procedure: BILATERAL IMPLANTED PULSE GENERATOR CHANGE FOR DEEP BRAIN STIMULATOR;  Surgeon: Erline Levine, MD;  Location: [place];  Service: Neurosurgery;  Laterality: Bilateral;   SUBTHALAMIC STIMULATOR INSERTION Bilateral 12/06/2013   Procedure: SUBTHALAMIC STIMULATOR INSERTION;  Surgeon: Erline Levine, MD;  Location: [place] [facility] ORS;  Service: Neurosurgery;  Laterality: Bilateral;  Bilateral deep brain stimulator placement    There were no vitals filed for this visit.   Subjective Assessment - 02/17/21 1113     Subjective Pt continues to report 4/10 pain in low back. Pt arrives in transport chair this morning. He and wife report pt falling in bathroom yesterday and out of bed this morning.  Wife states pt has been unsteady on feet this morning.    Patient is accompained by: Family member    Pertinent History [name] is a 61yoM who comes to [facility] neuro for evaluation of conitnued difficulty of imbalance, unsteadiness on feet, and generally limited tolerance to mobility in the setting of chronic parkinsonism. SInce DC in February, pt has had PPM placement due to bradycardia issues. Pt also started using an UpWalker device 2 week prior to this evaluation, goal was to help with gradual melting of postural extension that occured with sustained upright  actiivty. Pt has continued to practice supervised AMB with caregivers, short distances out of house and in community. Pt continues to experience frequent falls on a weekly basis, at a rate that both he and wife report to be increasing in frequency. PMH: HTN, bradycardia s/p PPM, PD, DBS, COVID-19 infection, chronic low back pain s/p L4/5 surgery.    Limitations Walking;Standing;Sitting;House hold activities;Lifting    How long can you sit comfortably? Not limited    How long can you stand comfortably? 3-5 minutes with support    How long can you walk comfortably? ~5-6 minutes UpWalker Walker    Patient Stated Goals Reduce falls, improve AMB tolerance    Currently in Pain? Yes    Pain Score 4     Pain Location Back    Pain Orientation Lower    Pain Onset More than a month ago    Multiple Pain Sites No    Pain Onset More than a month ago              Interventions:    Moist heat to patient in sitting in chair while performing LE strengthening    Seated Hip march 5lb BE 2 x 15 reps Seated knee ext 5lb BLE 2 x 15 reps Seated Hip abd GTB BLE 2 x 15 reps  Row, seated with GTB 2 x 10 reps with 3 second hold. VC on technique. Seated iron cross (RB pull apart) GTB, 2 x 10   Chest stretch with active ER and scap retraction. x60 seconds. Passive chest stretch - hands behind head with PT providing posterior OP at elbows. x30 seconds.  Alternating straight punches to target. Challenged accuracy of movement, coordination/sequencing, speed, ROM. 2 x 2 minutes, emphasis on trunk rotation during second set.    At support bar with UE support: Lateral stepping, alternating 1 step in each direction, focus on BIG step. x20 in each direction.  Lateral stepping over 6" hurdle for increased foot clearance. x20 each direction.  Lateral step with trunk rotation (step rotates 90* to open hip) and lunge into forward foot. Progressively decreased step length with PT cueing for maintenance of range of  movement. 2 x 10 BLE     Pt educated throughout session about proper posture and technique with exercises. Improved exercise technique, movement at target joints, use of target muscles after min to mod verbal, visual, tactile cues       Clinical Impression: Pt demonstrated excellent motivation throughout session today. Additional precautions taken to ensure pt safety due to patient having a "rough" morning with 2 reported falls in the past 24 hours. Pt encouraged to hold onto support bar at all times in standing. Great step length initially, range of movement progressively decreased within sets however pt was able to return to initial range with VC. Pt had difficulty maintaining speed/tempo of punches. Would like to include more speed/tempo training in future sessions. Pt will  continue to benefit from skilled therapy in order to increase function, QOL and decrease risk of future falls.            PT Short Term Goals - 01/11/21 1010       PT SHORT TERM GOAL #1   Title Pt to demonstrate improved sustained AMB tolerance >836f.    Baseline At evlauation: 6069ftolerance (not to failure); 9/26: 1384 ft    Time 4    Period Weeks    Status Achieved    Target Date 01/05/21      PT SHORT TERM GOAL #2   Title Pt to demonstrate MiUrsina17/28 to show improvement in balance.    Baseline Eval: 15/28; 9/26: deferred    Time 4    Period Weeks    Status On-going    Target Date 02/08/21      PT SHORT TERM GOAL #3   Title Pt to demonstrate 10MWT<13sec to show self-selected gait speed appropriate for AMB outside of house and to improve energy efficiency.    Baseline UpWalker Eval: 17.53sec; 9/26: 0.74 m/s (13.5)    Time 4    Period Weeks    Status Partially Met    Target Date 02/08/21               PT Long Term Goals - 01/11/21 1030       PT LONG TERM GOAL #1   Title Pt to score MiniBesTest > 20/28 to show improved balance.    Baseline Eval: 15/28; 9/26: deferred     Time 8    Period Weeks    Status On-going    Target Date 03/08/21      PT LONG TERM GOAL #2   Title Pt to tolerate 100022fustained AMB with UpWalker without rest interval to improve safety and independence in limited community AMB.    Baseline At eval; tired after 600f10f/26: 1384 ft    Time 8    Period Weeks    Status Achieved      PT LONG TERM GOAL #3   Title Pt to demonstrate improved FOTO score by 5 points compared to initital assessment.    Baseline 9/26: 49 (eval was 50)    Time 8    Period Weeks    Status On-going    Target Date 03/08/21      PT LONG TERM GOAL #4   Title Pt to show tolerance of AMB>1300ft99fh UpWalker to improve tolerance to accessing the community for IADL.    Baseline Eval: 600ft;71f26: 1385    Time 12    Period Weeks    Status Achieved      PT LONG TERM GOAL #5   Title Patient will increase six minute walk test distance to >1000 for progression to community ambulator and improve gait ability    Baseline 9/26: new    Time 8    Period Weeks    Status New    Target Date 03/08/21                   Plan - 02/17/21 1404     Clinical Impression Statement Pt demonstrated excellent motivation throughout session today. Additional precautions taken to ensure pt safety due to patient having a "rough" morning with 2 reported falls in the past 24 hours. Pt encouraged to hold onto support bar at all times in standing. Great step length initially, range of movement progressively decreased within sets however pt was  able to return to initial range with VC. Pt had difficulty maintaining speed/tempo of punches. Would like to include more speed/tempo training in future sessions. Pt will continue to benefit from skilled therapy in order to increase function, QOL and decrease risk of future falls.    Personal Factors and Comorbidities Age;Past/Current Experience;Time since onset of injury/illness/exacerbation;Transportation    Comorbidities Chronic back  pain, orthostatic hypotension, repeated falls, parkinsonism    Examination-Activity Limitations Bathing;Bed Mobility;Caring for Others;Bend;Dressing;Lift;Locomotion Level;Reach Overhead;Sit;Squat;Stairs;Stand;Toileting;Transfers;Carry    Examination-Participation Restrictions Cleaning;Community Activity;Interpersonal Relationship;Shop;Volunteer;Yard Work;Other;Driving    Stability/Clinical Decision Making Evolving/Moderate complexity    Rehab Potential Fair    PT Frequency 2x / week    PT Duration 12 weeks    PT Treatment/Interventions ADLs/Self Care Home Management;Aquatic Therapy;Cryotherapy;Electrical Stimulation;Iontophoresis 61m/ml Dexamethasone;Moist Heat;Ultrasound;Contrast Bath;DME Instruction;Gait training;Stair training;Functional mobility training;Therapeutic activities;Therapeutic exercise;Balance training;Neuromuscular re-education;Patient/family education;Manual techniques;Wheelchair mobility training;Energy conservation;Passive range of motion;Traction;Orthotic Fit/Training;Dry needling;Vestibular;Visual/perceptual remediation/compensation;Taping;Canalith Repostioning;Cognitive remediation    PT Next Visit Plan Advanced AMB tolerance, use MiniBesTest to guide balance interventions seated rotation and balance exercises    PT Home Exercise Plan None set up at evaluation, still working on previous HEP from 6 months prior (STS, daily walking); 8/29: issued STS with focus on large amplitude movement at support surface; 9/12: PT provides handout with sit to stand exercise and large amplitude seated trunk rotation exercise.  9/19: reinforced HEP, provided handout for seated postural exercise with UE abduction, and seated exercise with trunk twists and UE reach, 10x for each, can be performed daily or minimum of 4x/week. No changes today.    Consulted and Agree with Plan of Care Patient;Family member/caregiver    Family Member Consulted Wife             Patient will benefit from skilled  therapeutic intervention in order to improve the following deficits and impairments:  Abnormal gait, Decreased balance, Decreased coordination, Decreased mobility, Postural dysfunction, Decreased strength, Decreased safety awareness, Improper body mechanics, Impaired flexibility, Decreased activity tolerance, Decreased endurance, Decreased knowledge of precautions, Difficulty walking, Pain, Cardiopulmonary status limiting activity, Decreased range of motion, Impaired perceived functional ability, Decreased cognition, Dizziness  Visit Diagnosis: Abnormality of gait and mobility  Other lack of coordination  Difficulty in walking, not elsewhere classified  Unsteadiness on feet  Muscle weakness (generalized)  Other abnormalities of gait and mobility     Problem List Patient Active Problem List   Diagnosis Date Noted   Sinus drainage 12/09/2020   Medicare annual wellness visit, subsequent 11/18/2020   Pacemaker    D[place]'s disease [place] 06/17/2020   Vertigo 02/19/2020   Urine abnormality 02/19/2020   Dysphagia 02/23/2019   Fall at home 10/18/2017   REM behavioral disorder 11/02/2016   Radicular pain in right arm 10/09/2016   GERD (gastroesophageal reflux disease) 07/31/2016   Colon cancer screening 07/23/2015   Gout 02/18/2015   Depression 01/06/2015   Skin lesion 10/02/2014   Dupuytren's contracture 08/20/2014   Cough 07/21/2014   Lumbar stenosis with neurogenic claudication 06/13/2014   S/P deep brain stimulator placement 05/08/2014   Advance care planning 01/19/2014   [place]inson's disease ([place] 12/13/2013   PTSD (post-traumatic stress disorder) 06/13/2013   Erectile dysfunction 06/13/2013   HLD (hyperlipidemia) 06/13/2013   Essential hypertension 06/03/2013   Bradycardia by electrocardiogram 06/03/2013   Obstructive sleep apnea 03/12/2013    KPatrina LeveringPT, DPT  [place] [address]Phone: [phone]  Fax:  [phone]  Name: [name] MRN: [id number] Date of Birth: Sep 14, 1950

## 2021-02-22 ENCOUNTER — Encounter: Payer: Medicare PPO | Admitting: Speech Pathology

## 2021-02-22 ENCOUNTER — Ambulatory Visit: Payer: Medicare PPO | Admitting: Physical Therapy

## 2021-02-22 ENCOUNTER — Ambulatory Visit (INDEPENDENT_AMBULATORY_CARE_PROVIDER_SITE_OTHER): Payer: Medicare PPO

## 2021-02-22 DIAGNOSIS — I495 Sick sinus syndrome: Secondary | ICD-10-CM | POA: Diagnosis not present

## 2021-02-22 LAB — CUP PACEART REMOTE DEVICE CHECK
Battery Remaining Longevity: 161 mo
Battery Voltage: 3.13 V
Brady Statistic AP VP Percent: 0.04 %
Brady Statistic AP VS Percent: 91.7 %
Brady Statistic AS VP Percent: 0 %
Brady Statistic AS VS Percent: 8.26 %
Brady Statistic RA Percent Paced: 91.7 %
Brady Statistic RV Percent Paced: 0.04 %
Date Time Interrogation Session: 20221107002908
Implantable Lead Implant Date: 20220506
Implantable Lead Implant Date: 20220506
Implantable Lead Location: 753859
Implantable Lead Location: 753860
Implantable Lead Model: 5076
Implantable Lead Model: 5076
Implantable Pulse Generator Implant Date: 20220506
Lead Channel Impedance Value: 323 Ohm
Lead Channel Impedance Value: 361 Ohm
Lead Channel Impedance Value: 513 Ohm
Lead Channel Impedance Value: 532 Ohm
Lead Channel Pacing Threshold Amplitude: 0.75 V
Lead Channel Pacing Threshold Amplitude: 0.75 V
Lead Channel Pacing Threshold Pulse Width: 0.4 ms
Lead Channel Pacing Threshold Pulse Width: 0.4 ms
Lead Channel Sensing Intrinsic Amplitude: 1.875 mV
Lead Channel Sensing Intrinsic Amplitude: 1.875 mV
Lead Channel Sensing Intrinsic Amplitude: 9.875 mV
Lead Channel Sensing Intrinsic Amplitude: 9.875 mV
Lead Channel Setting Pacing Amplitude: 1.5 V
Lead Channel Setting Pacing Amplitude: 2 V
Lead Channel Setting Pacing Pulse Width: 0.4 ms
Lead Channel Setting Sensing Sensitivity: 1.2 mV

## 2021-02-24 ENCOUNTER — Other Ambulatory Visit: Payer: Self-pay

## 2021-02-24 ENCOUNTER — Ambulatory Visit: Payer: Medicare PPO | Admitting: Speech Pathology

## 2021-02-24 ENCOUNTER — Ambulatory Visit: Payer: Medicare PPO

## 2021-02-24 DIAGNOSIS — F0280 Dementia in other diseases classified elsewhere without behavioral disturbance: Secondary | ICD-10-CM

## 2021-02-24 DIAGNOSIS — R262 Difficulty in walking, not elsewhere classified: Secondary | ICD-10-CM | POA: Diagnosis not present

## 2021-02-24 DIAGNOSIS — M6281 Muscle weakness (generalized): Secondary | ICD-10-CM | POA: Diagnosis not present

## 2021-02-24 DIAGNOSIS — R2681 Unsteadiness on feet: Secondary | ICD-10-CM

## 2021-02-24 DIAGNOSIS — R269 Unspecified abnormalities of gait and mobility: Secondary | ICD-10-CM | POA: Diagnosis not present

## 2021-02-24 DIAGNOSIS — R278 Other lack of coordination: Secondary | ICD-10-CM | POA: Diagnosis not present

## 2021-02-24 DIAGNOSIS — R41841 Cognitive communication deficit: Secondary | ICD-10-CM

## 2021-02-24 DIAGNOSIS — R2689 Other abnormalities of gait and mobility: Secondary | ICD-10-CM | POA: Diagnosis not present

## 2021-02-24 DIAGNOSIS — G20 Parkinson's disease: Secondary | ICD-10-CM

## 2021-02-24 DIAGNOSIS — R296 Repeated falls: Secondary | ICD-10-CM | POA: Diagnosis not present

## 2021-02-24 NOTE — Therapy (Signed)
[place] MAIN [facility] SERVICES [address] [place], [place], [address] Phone: [phone]   Fax:  [phone]  Physical Therapy Treatment  Patient Details  Name: [name] MRN: [id number] Date of Birth: Nov 07, 1950 Referring Provider (PT): Elsie Stain, MD   Encounter Date: 02/24/2021   PT End of Session - 02/24/21 1438     Visit Number 18    Number of Visits 24    Date for PT Re-Evaluation 03/08/21    Authorization Type [employer] Medicare Choice PPO    Authorization Time Period recert for 7/65-46/50    Progress Note Due on Visit 10    PT Start Time 1431    PT Stop Time 1513    PT Time Calculation (min) 42 min    Equipment Utilized During Treatment Gait belt    Activity Tolerance Patient tolerated treatment well    Behavior During Therapy [facility] for tasks assessed/performed             Past Medical History:  Diagnosis Date   Arthritis    Bradycardia    Cancer ([place]) 2013   skin cancer   Depression    ptsd   Dysrhythmia    chronic slow heart rate   GERD (gastroesophageal reflux disease)    Headache(784.0)    tension headaches non recent   History of chicken pox    History of kidney stones    passed   Hypertension    treated with HCTZ   Pacemaker    Parkinson's disease ([place])    dx'ed 15 years ago   PTSD (post-traumatic stress disorder)    Shortness of breath dyspnea    Sleep apnea    doesn't use C-pap   Varicose veins     Past Surgical History:  Procedure Laterality Date   CHOLECYSTECTOMY N/A 10/22/2014   Procedure: LAPAROSCOPIC CHOLECYSTECTOMY WITH INTRAOPERATIVE CHOLANGIOGRAM;  Surgeon: Dia Crawford III, MD;  Location: [facility] ORS;  Service: General;  Laterality: N/A;   cyst removed      from lip as a child   INTRAMEDULLARY (IM) NAIL INTERTROCHANTERIC N/A 02/18/2019   Procedure: INTRAMEDULLARY (IM) NAIL INTERTROCHANTRIC, RIGHT,;  Surgeon: Thornton Park, MD;  Location: [facility] ORS;  Service: Orthopedics;  Laterality:  N/A;   LUMBAR LAMINECTOMY/DECOMPRESSION MICRODISCECTOMY Bilateral 12/14/2012   Procedure: Bilateral lumbar three-four, four-five decompressive laminotomy/foraminotomy;  Surgeon: Charlie Pitter, MD;  Location: [place] NEURO ORS;  Service: Neurosurgery;  Laterality: Bilateral;   PULSE GENERATOR IMPLANT Bilateral 12/13/2013   Procedure: Bilateral implantable pulse generator placement;  Surgeon: Erline Levine, MD;  Location: [place] [facility]UR[facility] ORS;  Service: Neurosurgery;  Laterality: Bilateral;  Bilateral implantable pulse generator placement   skin cancer removed     from ears,   12 lft arm  rt leg 15   SUBTHALAMIC STIMULATOR BATTERY REPLACEMENT Bilateral 07/14/2017   Procedure: BILATERAL IMPLANTED PULSE GENERATOR CHANGE FOR DEEP BRAIN STIMULATOR;  Surgeon: Erline Levine, MD;  Location: [place];  Service: Neurosurgery;  Laterality: Bilateral;   SUBTHALAMIC STIMULATOR INSERTION Bilateral 12/06/2013   Procedure: SUBTHALAMIC STIMULATOR INSERTION;  Surgeon: Erline Levine, MD;  Location: [place] NEURO ORS;  Service: Neurosurgery;  Laterality: Bilateral;  Bilateral deep brain stimulator placement    There were no vitals filed for this visit.   Subjective Assessment - 02/24/21 1436     Subjective Pt continues to report some imbalance this week as well as continued  4/10 pain in low back.    Patient is accompained by: Family member    Pertinent  History [name] is a 24yoM who comes to [facility]PPT neuro for evaluation of conitnued difficulty of imbalance, unsteadiness on feet, and generally limited tolerance to mobility in the setting of chronic parkinsonism. SInce DC in February, pt has had PPM placement due to bradycardia issues. Pt also started using an UpWalker device 2 week prior to this evaluation, goal was to help with gradual melting of postural extension that occured with sustained upright actiivty. Pt has continued to practice supervised AMB with caregivers, short distances out of house and in community. Pt continues to  experience frequent falls on a weekly basis, at a rate that both he and wife report to be increasing in frequency. PMH: HTN, bradycardia s/p PPM, PD, DBS, COVID-19 infection, chronic low back pain s/p L4/5 surgery.    Limitations Walking;Standing;Sitting;House hold activities;Lifting    How long can you sit comfortably? Not limited    How long can you stand comfortably? 3-5 minutes with support    How long can you walk comfortably? ~5-6 minutes UpWalker Walker    Patient Stated Goals Reduce falls, improve AMB tolerance    Currently in Pain? Yes    Pain Score 4     Pain Location Back    Pain Descriptors / Indicators Aching    Pain Type Chronic pain    Pain Onset More than a month ago    Pain Frequency Constant    Aggravating Factors  Walking; transfers/getting in/out of bad    Pain Relieving Factors Rest, moist heat    Effect of Pain on Daily Activities Difficulty with ADL's and walking.    Pain Onset More than a month ago               Interventions:         Interventions:    Moist heat to patient in sitting in chair while performing LE strengthening    Seated Hip march 5lb BE 2 x 15 reps Seated knee ext 5lb BLE 2 x 15 reps Seated Hip abd GTB BLE 2 x 15 reps   Row, seated with RTB 2 x 12 reps with 3 second hold. VC on technique. Seated horizontal Shoulder abd RTB 2 x 12 reps      Alternating trunk twist to tap sticky note on wall overhead - Right hand to left side and left hand to right side  to target. Challenged accuracy of movement, coordination/sequencing, speed, ROM. 2 x 2 minutes, emphasis on trunk rotation during second set.    At support bar with UE support: Lateral stepping, alternating 1 step in each direction, focus on BIG step. x20 in each direction.  Lateral step with trunk rotation (step rotates 90* to open hip) and lunge into forward foot.  x 10 BLE     Pt educated throughout session about proper posture and technique with exercises. Improved exercise  technique, movement at target joints, use of target muscles after min to mod verbal, visual, tactile cues   Patient reported 3/10 Low back pain at end of session    Clinical Impression: Pt limited some today due to ongoing low back pain. He responded well to moist heat and seated LE strengthening. He was responsive to VC and visual demo with lateral steps and trunk rotation exercises without report of any worsening LBP and actually reported decreased low back at end of session. Pt will continue to benefit from skilled therapy in order to increase function, QOL and decrease risk of future falls.  PT Education - 02/24/21 1437     Education provided Yes    Education Details Exercise technique    Person(s) Educated Patient    Methods Explanation;Demonstration;Tactile cues;Verbal cues    Comprehension Verbalized understanding;Verbal cues required;Need further instruction;Returned demonstration              PT Short Term Goals - 01/11/21 1010       PT SHORT TERM GOAL #1   Title Pt to demonstrate improved sustained AMB tolerance >841ft.    Baseline At evlauation: 658ft tolerance (not to failure); 9/26: 1384 ft    Time 4    Period Weeks    Status Achieved    Target Date 01/05/21      PT SHORT TERM GOAL #2   Title Pt to demonstrate [place] >17/28 to show improvement in balance.    Baseline Eval: 15/28; 9/26: deferred    Time 4    Period Weeks    Status On-going    Target Date 02/08/21      PT SHORT TERM GOAL #3   Title Pt to demonstrate 10MWT<13sec to show self-selected gait speed appropriate for AMB outside of house and to improve energy efficiency.    Baseline UpWalker Eval: 17.53sec; 9/26: 0.74 m/s (13.5)    Time 4    Period Weeks    Status Partially Met    Target Date 02/08/21               PT Long Term Goals - 01/11/21 1030       PT LONG TERM GOAL #1   Title Pt to score MiniBesTest > 20/28 to show improved balance.     Baseline Eval: 15/28; 9/26: deferred    Time 8    Period Weeks    Status On-going    Target Date 03/08/21      PT LONG TERM GOAL #2   Title Pt to tolerate 1045ft sustained AMB with UpWalker without rest interval to improve safety and independence in limited community AMB.    Baseline At eval; tired after 63ft; 9/26: 1384 ft    Time 8    Period Weeks    Status Achieved      PT LONG TERM GOAL #3   Title Pt to demonstrate improved FOTO score by 5 points compared to initital assessment.    Baseline 9/26: 49 (eval was 50)    Time 8    Period Weeks    Status On-going    Target Date 03/08/21      PT LONG TERM GOAL #4   Title Pt to show tolerance of AMB>1330ft with UpWalker to improve tolerance to accessing the community for IADL.    Baseline Eval: 6100ft;  9/26: 1385    Time 12    Period Weeks    Status Achieved      PT LONG TERM GOAL #5   Title Patient will increase six minute walk test distance to >1000 for progression to community ambulator and improve gait ability    Baseline 9/26: new    Time 8    Period Weeks    Status New    Target Date 03/08/21                   Plan - 02/24/21 1248     Clinical Impression Statement Pt limited some today due to ongoing low back pain. He responded well to moist heat and seated LE strengthening. He was responsive to [facility] and visual demo with  lateral steps and trunk rotation exercises without report of any worsening LBP and actually reported decreased low back at end of session. Pt will continue to benefit from skilled therapy in order to increase function, QOL and decrease risk of future falls    Personal Factors and Comorbidities Age;Past/Current Experience;Time since onset of injury/illness/exacerbation;Transportation    Comorbidities Chronic back pain, orthostatic hypotension, repeated falls, parkinsonism    Examination-Activity Limitations Bathing;Bed Mobility;Caring for Others;Bend;Dressing;Lift;Locomotion Level;Reach  Overhead;Sit;Squat;Stairs;Stand;Toileting;Transfers;Carry    Examination-Participation Restrictions Cleaning;Community Activity;Interpersonal Relationship;Shop;Volunteer;Yard Work;Other;Driving    Stability/Clinical Decision Making Evolving/Moderate complexity    Rehab Potential Fair    PT Frequency 2x / week    PT Duration 12 weeks    PT Treatment/Interventions ADLs/Self Care Home Management;Aquatic Therapy;Cryotherapy;Electrical Stimulation;Iontophoresis 4mg /ml Dexamethasone;Moist Heat;Ultrasound;Contrast Bath;DME Instruction;Gait training;Stair training;Functional mobility training;Therapeutic activities;Therapeutic exercise;Balance training;Neuromuscular re-education;Patient/family education;Manual techniques;Wheelchair mobility training;Energy conservation;Passive range of motion;Traction;Orthotic Fit/Training;Dry needling;Vestibular;Visual/perceptual remediation/compensation;Taping;Canalith Repostioning;Cognitive remediation    PT Next Visit Plan Advanced AMB tolerance, use MiniBesTest to guide balance interventions seated rotation and balance exercises    PT Home Exercise Plan None set up at evaluation, still working on previous HEP from 6 months prior (STS, daily walking); 8/29: issued STS with focus on large amplitude movement at support surface; 9/12: PT provides handout with sit to stand exercise and large amplitude seated trunk rotation exercise.  9/19: reinforced HEP, provided handout for seated postural exercise with UE abduction, and seated exercise with trunk twists and UE reach, 10x for each, can be performed daily or minimum of 4x/week. No changes today.    Consulted and Agree with Plan of Care Patient;Family member/caregiver    Family Member Consulted Wife             Patient will benefit from skilled therapeutic intervention in order to improve the following deficits and impairments:  Abnormal gait, Decreased balance, Decreased coordination, Decreased mobility, Postural  dysfunction, Decreased strength, Decreased safety awareness, Improper body mechanics, Impaired flexibility, Decreased activity tolerance, Decreased endurance, Decreased knowledge of precautions, Difficulty walking, Pain, Cardiopulmonary status limiting activity, Decreased range of motion, Impaired perceived functional ability, Decreased cognition, Dizziness  Visit Diagnosis: Abnormality of gait and mobility  Difficulty in walking, not elsewhere classified  Muscle weakness (generalized)  Unsteadiness on feet     Problem List Patient Active Problem List   Diagnosis Date Noted   Sinus drainage 12/09/2020   Medicare annual wellness visit, subsequent 11/18/2020   Pacemaker    Dementia associated with Parkinson's disease (HCC) 06/17/2020   Vertigo 02/19/2020   Urine abnormality 02/19/2020   Dysphagia 02/23/2019   Fall at home 10/18/2017   REM behavioral disorder 11/02/2016   Radicular pain in right arm 10/09/2016   GERD (gastroesophageal reflux disease) 07/31/2016   Colon cancer screening 07/23/2015   Gout 02/18/2015   Depression 01/06/2015   Skin lesion 10/02/2014   Dupuytren's contracture 08/20/2014   Cough 07/21/2014   Lumbar stenosis with neurogenic claudication 06/13/2014   S/P deep brain stimulator placement 05/08/2014   Advance care planning 01/19/2014   Parkinson's disease (HCC) 12/13/2013   PTSD (post-traumatic stress disorder) 06/13/2013   Erectile dysfunction 06/13/2013   HLD (hyperlipidemia) 06/13/2013   Essential hypertension 06/03/2013   Bradycardia by electrocardiogram 06/03/2013   Obstructive sleep apnea 03/12/2013    03/14/2013, PT 02/25/2021, 12:56 PM  [place] [facility] MAIN [facility] SERVICES [address] [place], [place], [place] Phone: [phone]   Fax:  [phone]  Name: [name] MRN: Kallie Locks Date of Birth: [birth year]

## 2021-02-25 NOTE — Therapy (Signed)
[place] MAIN [facility] SERVICES [address] [place], [place], [address] Phone: [phone]   Fax:  [phone]  Speech Language Pathology Treatment  Patient Details  Name: [name] MRN: [id number] Date of Birth: [birth year] Referring Provider (SLP): Elsie Stain   Encounter Date: 02/24/2021   End of Session - 02/25/21 0939     Visit Number 15    Number of Visits 17    Date for SLP Re-Evaluation 03/04/21    Authorization Type [employer] Medicare Choice PPO    Authorization Time Period 12/08/2020 thru 03/04/2021    Authorization - Visit Number 5    Progress Note Due on Visit 10    SLP Start Time 1300    SLP Stop Time  1400    SLP Time Calculation (min) 60 min    Activity Tolerance Patient tolerated treatment well             Past Medical History:  Diagnosis Date   Arthritis    Bradycardia    Cancer ([place]) 2013   skin cancer   Depression    ptsd   Dysrhythmia    chronic slow heart rate   GERD (gastroesophageal reflux disease)    Headache(784.0)    tension headaches non recent   History of chicken pox    History of kidney stones    passed   Hypertension    treated with HCTZ   Pacemaker    Parkinson's disease ([place])    dx'ed 15 years ago   PTSD (post-traumatic stress disorder)    Shortness of breath dyspnea    Sleep apnea    doesn't use C-pap   Varicose veins     Past Surgical History:  Procedure Laterality Date   CHOLECYSTECTOMY N/A 10/22/2014   Procedure: LAPAROSCOPIC CHOLECYSTECTOMY WITH INTRAOPERATIVE CHOLANGIOGRAM;  Surgeon: Dia Crawford III, MD;  Location: [facility] ORS;  Service: General;  Laterality: N/A;   cyst removed      from lip as a child   INTRAMEDULLARY (IM) NAIL INTERTROCHANTERIC N/A 02/18/2019   Procedure: INTRAMEDULLARY (IM) NAIL INTERTROCHANTRIC, RIGHT,;  Surgeon: Thornton Park, MD;  Location: [facility] ORS;  Service: Orthopedics;  Laterality: N/A;   LUMBAR LAMINECTOMY/DECOMPRESSION MICRODISCECTOMY Bilateral  12/14/2012   Procedure: Bilateral lumbar three-four, four-five decompressive laminotomy/foraminotomy;  Surgeon: Charlie Pitter, MD;  Location: [place] NEURO ORS;  Service: Neurosurgery;  Laterality: Bilateral;   PULSE GENERATOR IMPLANT Bilateral 12/13/2013   Procedure: Bilateral implantable pulse generator placement;  Surgeon: Erline Levine, MD;  Location: [place] NEURO ORS;  Service: Neurosurgery;  Laterality: Bilateral;  Bilateral implantable pulse generator placement   skin cancer removed     from ears,   12 lft arm  rt leg 15   SUBTHALAMIC STIMULATOR BATTERY REPLACEMENT Bilateral 07/14/2017   Procedure: BILATERAL IMPLANTED PULSE GENERATOR CHANGE FOR DEEP BRAIN STIMULATOR;  Surgeon: Erline Levine, MD;  Location: [place];  Service: Neurosurgery;  Laterality: Bilateral;   SUBTHALAMIC STIMULATOR INSERTION Bilateral 12/06/2013   Procedure: SUBTHALAMIC STIMULATOR INSERTION;  Surgeon: Erline Levine, MD;  Location: [place] NEURO ORS;  Service: Neurosurgery;  Laterality: Bilateral;  Bilateral deep brain stimulator placement    There were no vitals filed for this visit.   Subjective Assessment - 02/25/21 0911     Subjective Patient pleasantly motivated to participate in therapy. Spouse [name] present and encouraging throghout session.    Patient is accompained by: Family member    Currently in Pain? No/denies  ADULT SLP TREATMENT - 02/25/21 0001       Treatment Provided   Treatment provided Cognitive-Linquistic      Cognitive-Linquistic Treatment   Treatment focused on Cognition;Dysarthria;Patient/family/caregiver education    Skilled Treatment COGNITIVE COMMUNICATION: pt benefited from having list of written questions to help decrease response time and promote cohesion of information giving; continued education provided on slow rate of speech and need to engage in cognitive activities throughout each day              SLP Education - 02/25/21 0939     Education Details speech  intelligibility strategies, compensatory memory strategies    Person(s) Educated Patient;Spouse    Methods Explanation;Demonstration    Comprehension Verbalized understanding;Returned demonstration;Need further instruction              SLP Short Term Goals - 02/08/21 1720       SLP SHORT TERM GOAL #1   Title Pt will maintain speech volume appropriate for 60% intelligibilty at the sentence level with minimal cues.    Baseline goal revised, 02/08/2021    Time 10    Period --   sessions   Status Revised      SLP SHORT TERM GOAL #2   Title Pt will use 1 external compensatory memory aid to recall functional information with minimal assistance.    Baseline goal revised, 02/08/2021    Time 10    Period --   sessions   Status Revised      SLP SHORT TERM GOAL #3   Title Pt will sustain attention to basic task for 1 minute with moderate assistance.    Time 10    Period --   sessions   Status On-going              SLP Long Term Goals - 02/08/21 1722       SLP LONG TERM GOAL #1   Title Pt will use speech intelligibility strategies to achieve ~ 80% speech intelligiblity at the sentence level with intermittent supervision cues.    Status On-going    Target Date 03/04/21      SLP LONG TERM GOAL #2   Title Patient will identify cognitive-communication barriers and participate in developing functional compensatory strategies.    Status On-going    Target Date 03/04/21      SLP LONG TERM GOAL #3   Title Pt will complete cognitive communication HEP.    Status On-going    Target Date 03/04/21              Plan - 02/25/21 0940     Clinical Impression Statement Pt demonstrated independent use of compensatory memory strategies to recall information. While pt continues to present with moderate to severe cognitive communication impairments, he is engaging in more cognitive activities throughout his day. Patient demonstrated improvement in speech intelligibility for functional  communication with cues for increased volume and over articulation, notably patient more challenged to carry over strategies from structured responses to unstructured conversational speech. Further patient is unable to identify errors in speech requiring SLP cues to utilize strategies to rectify communcation breakdowns. Skilled ST intervention continues to be indicated to increase pt's cognitive communication ability and speech by helping pt and his wife identify and overcome barriers to pt's independence.    Speech Therapy Frequency 2x / week    Duration 8 weeks    Treatment/Interventions Compensatory techniques;Internal/external aids;Functional tasks;SLP instruction and feedback;Patient/family education    Potential to [place]  Potential Considerations Ability to learn/carryover information;Medical prognosis;Severity of impairments;Co-morbidities;Previous level of function    Consulted and Agree with Plan of Care Patient;Family member/caregiver    Family Member Consulted [name]             Patient will benefit from skilled therapeutic intervention in order to improve the following deficits and impairments:   Cognitive communication deficit  Parkinson's disease ([place])  Dementia associated with Parkinson's disease ([place])    Problem List Patient Active Problem List   Diagnosis Date Noted   Sinus drainage 12/09/2020   Medicare annual wellness visit, subsequent 11/18/2020   Pacemaker    Dementia associated with Parkinson's disease ([place]) 06/17/2020   Vertigo 02/19/2020   Urine abnormality 02/19/2020   Dysphagia 02/23/2019   Fall at home 10/18/2017   REM behavioral disorder 11/02/2016   Radicular pain in right arm 10/09/2016   GERD (gastroesophageal reflux disease) 07/31/2016   Colon cancer screening 07/23/2015   Gout 02/18/2015   Depression 01/06/2015   Skin lesion 10/02/2014   Dupuytren's contracture 08/20/2014   Cough 07/21/2014   Lumbar stenosis with  neurogenic claudication 06/13/2014   S/P deep brain stimulator placement 05/08/2014   Advance care planning 01/19/2014   Parkinson's disease ([place]) 12/13/2013   PTSD (post-traumatic stress disorder) 06/13/2013   Erectile dysfunction 06/13/2013   HLD (hyperlipidemia) 06/13/2013   Essential hypertension 06/03/2013   Bradycardia by electrocardiogram 06/03/2013   Obstructive sleep apnea 03/12/2013   Lavette Yankovich B. Rutherford Nail M.S., CCC-SLP, [place] Pathologist Rehabilitation Services Office [phone]  Stormy Fabian 02/25/2021, 9:41 AM  [place] MAIN [facility] SERVICES [address] [place], [place], [address] Phone: [phone]   Fax:  [phone]   Name: [name] MRN: [id number] Date of Birth: 16-Apr-1951

## 2021-03-01 ENCOUNTER — Other Ambulatory Visit: Payer: Self-pay

## 2021-03-01 ENCOUNTER — Ambulatory Visit: Payer: Medicare PPO | Admitting: Physical Therapy

## 2021-03-01 ENCOUNTER — Ambulatory Visit: Payer: Medicare PPO | Admitting: Speech Pathology

## 2021-03-01 DIAGNOSIS — F0280 Dementia in other diseases classified elsewhere without behavioral disturbance: Secondary | ICD-10-CM

## 2021-03-01 DIAGNOSIS — G20 Parkinson's disease: Secondary | ICD-10-CM

## 2021-03-01 DIAGNOSIS — M6281 Muscle weakness (generalized): Secondary | ICD-10-CM

## 2021-03-01 DIAGNOSIS — R41841 Cognitive communication deficit: Secondary | ICD-10-CM | POA: Diagnosis not present

## 2021-03-01 DIAGNOSIS — R2681 Unsteadiness on feet: Secondary | ICD-10-CM

## 2021-03-01 DIAGNOSIS — R262 Difficulty in walking, not elsewhere classified: Secondary | ICD-10-CM | POA: Diagnosis not present

## 2021-03-01 DIAGNOSIS — R296 Repeated falls: Secondary | ICD-10-CM | POA: Diagnosis not present

## 2021-03-01 DIAGNOSIS — R269 Unspecified abnormalities of gait and mobility: Secondary | ICD-10-CM

## 2021-03-01 DIAGNOSIS — R278 Other lack of coordination: Secondary | ICD-10-CM | POA: Diagnosis not present

## 2021-03-01 DIAGNOSIS — R2689 Other abnormalities of gait and mobility: Secondary | ICD-10-CM | POA: Diagnosis not present

## 2021-03-01 NOTE — Therapy (Signed)
[place] MAIN [facility] SERVICES [address] [place], [place], [address] Phone: [phone]   Fax:  [phone]  Physical Therapy Treatment  Patient Details  Name: [name] MRN: [id number] Date of Birth: 05-12-1950 Referring Provider (PT): Elsie Stain, MD   Encounter Date: 03/01/2021   PT End of Session - 03/01/21 1123     Visit Number 19    Number of Visits 24    Date for PT Re-Evaluation 03/08/21    Authorization Type [employer] Medicare Choice PPO    Authorization Time Period recert for 3/71-06/26    Progress Note Due on Visit 20    PT Start Time 1100    PT Stop Time 1141    PT Time Calculation (min) 41 min    Equipment Utilized During Treatment Gait belt    Activity Tolerance Patient tolerated treatment well    Behavior During Therapy WFL for tasks assessed/performed             Past Medical History:  Diagnosis Date   Arthritis    Bradycardia    Cancer ([place]) 2013   skin cancer   Depression    ptsd   Dysrhythmia    chronic slow heart rate   GERD (gastroesophageal reflux disease)    Headache(784.0)    tension headaches non recent   History of chicken pox    History of kidney stones    passed   Hypertension    treated with HCTZ   Pacemaker    Parkinson's disease ([place])    dx'ed 15 years ago   PTSD (post-traumatic stress disorder)    Shortness of breath dyspnea    Sleep apnea    doesn't use C-pap   Varicose veins     Past Surgical History:  Procedure Laterality Date   CHOLECYSTECTOMY N/A 10/22/2014   Procedure: LAPAROSCOPIC CHOLECYSTECTOMY WITH INTRAOPERATIVE CHOLANGIOGRAM;  Surgeon: Dia Crawford III, MD;  Location: [facility]S;  Service: General;  Laterality: N/A;   cyst removed      from lip as a child   INTRAMEDULLARY (IM) NAIL INTERTROCHANTERIC N/A 02/18/2019   Procedure: INTRAMEDULLARY (IM) NAIL INTERTROCHANTRIC, RIGHT,;  Surgeon: Thornton Park, MD;  Location: [facility]S;  Service: Orthopedics;  Laterality:  N/A;   LUMBAR LAMINECTOMY/DECOMPRESSION MICRODISCECTOMY Bilateral 12/14/2012   Procedure: Bilateral lumbar three-four, four-five decompressive laminotomy/foraminotomy;  Surgeon: Charlie Pitter, MD;  Location: [place] [facility];  Service: Neurosurgery;  Laterality: Bilateral;   PULSE GENERATOR IMPLANT Bilateral 12/13/2013   Procedure: Bilateral implantable pulse generator placement;  Surgeon: Erline Levine, MD;  Location: [place] [facility];  Service: Neurosurgery;  Laterality: Bilateral;  Bilateral implantable pulse generator placement   skin cancer removed     from ears,   12 lft arm  rt leg 15   SUBTHALAMIC STIMULATOR BATTERY REPLACEMENT Bilateral 07/14/2017   Procedure: BILATERAL IMPLANTED PULSE GENERATOR CHANGE FOR DEEP BRAIN STIMULATOR;  Surgeon: Erline Levine, MD;  Location: [place];  Service: Neurosurgery;  Laterality: Bilateral;   SUBTHALAMIC STIMULATOR INSERTION Bilateral 12/06/2013   Procedure: SUBTHALAMIC STIMULATOR INSERTION;  Surgeon: Erline Levine, MD;  Location: [place] [facility];  Service: Neurosurgery;  Laterality: Bilateral;  Bilateral deep brain stimulator placement    There were no vitals filed for this visit.    Seated Hip march 5lb BE 2 x 15 reps -cues for no utilization of UE on arm rests and to ensure pt not leaning on back of chair to ipmrove core muscle activation Seated knee ext 5lb BLE 2 x  15 reps Seated PWR! Step 1 x 10 on ea side  Seated repeated turnign with PWR! Step in chair without arm support   STS with cues for PWR! Up to improve amplitude of movement - 2 x 10   Row, seated with RTB 2 x 12 reps with 3 second hold. VC on technique.  Seated horizontal Shoulder abd RTB 2 x 12 reps    Ambulation training with Up walker and metronome, 150 feet at 80 SPM and 150 feet at 86 SPM  -Improved gait speed with use of metronome and pt able to manage increased metronome speeds.   At support bar with UE support: Lateral stepping, alternating 1 step in each direction, focus on large  amplitude movement. x20 in each direction.                               PT Education - 03/01/21 1123     Education provided Yes    Education Details excersice technique    Person(s) Educated Patient    Methods Explanation;Demonstration;Tactile cues;Verbal cues    Comprehension Verbalized understanding;Returned demonstration;Need further instruction              PT Short Term Goals - 01/11/21 1010       PT SHORT TERM GOAL #1   Title Pt to demonstrate improved sustained AMB tolerance >848f.    Baseline At evlauation: 6044ftolerance (not to failure); 9/26: 1384 ft    Time 4    Period Weeks    Status Achieved    Target Date 01/05/21      PT SHORT TERM GOAL #2   Title Pt to demonstrate [place] to show improvement in balance.    Baseline Eval: 15/28; 9/26: deferred    Time 4    Period Weeks    Status On-going    Target Date 02/08/21      PT SHORT TERM GOAL #3   Title Pt to demonstrate 10MWT<13sec to show self-selected gait speed appropriate for AMB outside of house and to improve energy efficiency.    Baseline UpWalker Eval: 17.53sec; 9/26: 0.74 m/s (13.5)    Time 4    Period Weeks    Status Partially Met    Target Date 02/08/21               PT Long Term Goals - 01/11/21 1030       PT LONG TERM GOAL #1   Title Pt to score MiniBesTest > 20/28 to show improved balance.    Baseline Eval: 15/28; 9/26: deferred    Time 8    Period Weeks    Status On-going    Target Date 03/08/21      PT LONG TERM GOAL #2   Title Pt to tolerate 100087fustained AMB with UpWalker without rest interval to improve safety and independence in limited community AMB.    Baseline At eval; tired after 600f31f/26: 1384 ft    Time 8    Period Weeks    Status Achieved      PT LONG TERM GOAL #3   Title Pt to demonstrate improved FOTO score by 5 points compared to initital assessment.    Baseline 9/26: 49 (eval was 50)    Time 8    Period  Weeks    Status On-going    Target Date 03/08/21      PT LONG TERM GOAL #4   Title  Pt to show tolerance of AMB>1331f with UpWalker to improve tolerance to accessing the community for IADL.    Baseline Eval: 6046f  9/26: 1385    Time 12    Period Weeks    Status Achieved      PT LONG TERM GOAL #5   Title Patient will increase six minute walk test distance to >1000 for progression to community ambulator and improve gait ability    Baseline 9/26: new    Time 8    Period Weeks    Status New    Target Date 03/08/21                   Plan - 03/01/21 1128     Clinical Impression Statement Patient presents to therapy with excellent motivation for completion of therapy program.  Patient reports decreased low back pain today compared to previous sessions.  Patient required significant cueing for seated sidestepping exercises in order to perform exercise properly.  Patient was able to ambulate at increased speed with increased cadence with utilization of metronome.  Patient also demonstrated improved efficacy with sidestep with lunch today and able to make a larger step with large amplitude movement.  Patient will continue to benefit from skilled physical therapy intervention in order to improve his lower extremity strength, balance, reduce his fall risk, and improve his overall quality of life.    Personal Factors and Comorbidities Age;Past/Current Experience;Time since onset of injury/illness/exacerbation;Transportation    Comorbidities Chronic back pain, orthostatic hypotension, repeated falls, parkinsonism    Examination-Activity Limitations Bathing;Bed Mobility;Caring for Others;Bend;Dressing;Lift;Locomotion Level;Reach Overhead;Sit;Squat;Stairs;Stand;Toileting;Transfers;Carry    Examination-Participation Restrictions Cleaning;Community Activity;Interpersonal Relationship;Shop;Volunteer;Yard Work;Other;Driving    Stability/Clinical Decision Making Evolving/Moderate complexity     Rehab Potential Fair    PT Frequency 2x / week    PT Duration 12 weeks    PT Treatment/Interventions ADLs/Self Care Home Management;Aquatic Therapy;Cryotherapy;Electrical Stimulation;Iontophoresis 90m690ml Dexamethasone;Moist Heat;Ultrasound;Contrast Bath;DME Instruction;Gait training;Stair training;Functional mobility training;Therapeutic activities;Therapeutic exercise;Balance training;Neuromuscular re-education;Patient/family education;Manual techniques;Wheelchair mobility training;Energy conservation;Passive range of motion;Traction;Orthotic Fit/Training;Dry needling;Vestibular;Visual/perceptual remediation/compensation;Taping;Canalith Repostioning;Cognitive remediation    PT Next Visit Plan Advanced AMB tolerance, use MiniBesTest to guide balance interventions seated rotation and balance exercises    PT Home Exercise Plan None set up at evaluation, still working on previous HEP from 6 months prior (STS, daily walking); 8/29: issued STS with focus on large amplitude movement at support surface; 9/12: PT provides handout with sit to stand exercise and large amplitude seated trunk rotation exercise.  9/19: reinforced HEP, provided handout for seated postural exercise with UE abduction, and seated exercise with trunk twists and UE reach, 10x for each, can be performed daily or minimum of 4x/week. No changes today.    Consulted and Agree with Plan of Care Patient;Family member/caregiver    Family Member Consulted Wife             Patient will benefit from skilled therapeutic intervention in order to improve the following deficits and impairments:  Abnormal gait, Decreased balance, Decreased coordination, Decreased mobility, Postural dysfunction, Decreased strength, Decreased safety awareness, Improper body mechanics, Impaired flexibility, Decreased activity tolerance, Decreased endurance, Decreased knowledge of precautions, Difficulty walking, Pain, Cardiopulmonary status limiting activity, Decreased  range of motion, Impaired perceived functional ability, Decreased cognition, Dizziness  Visit Diagnosis: Abnormality of gait and mobility  Difficulty in walking, not elsewhere classified  Unsteadiness on feet  Muscle weakness (generalized)     Problem List Patient Active Problem List   Diagnosis Date Noted   Sinus drainage 12/09/2020   Medicare  annual wellness visit, subsequent 11/18/2020   Pacemaker    Dementia associated with Parkinson's disease ([place]) 06/17/2020   Vertigo 02/19/2020   Urine abnormality 02/19/2020   Dysphagia 02/23/2019   Fall at home 10/18/2017   REM behavioral disorder 11/02/2016   Radicular pain in right arm 10/09/2016   GERD (gastroesophageal reflux disease) 07/31/2016   Colon cancer screening 07/23/2015   Gout 02/18/2015   Depression 01/06/2015   Skin lesion 10/02/2014   Dupuytren's contracture 08/20/2014   Cough 07/21/2014   Lumbar stenosis with neurogenic claudication 06/13/2014   S/P deep brain stimulator placement 05/08/2014   Advance care planning 01/19/2014   Parkinson's disease ([place]) 12/13/2013   PTSD (post-traumatic stress disorder) 06/13/2013   Erectile dysfunction 06/13/2013   HLD (hyperlipidemia) 06/13/2013   Essential hypertension 06/03/2013   Bradycardia by electrocardiogram 06/03/2013   Obstructive sleep apnea 03/12/2013    Particia Lather, PT 03/01/2021, 5:06 PM  [place] MAIN [facility] SERVICES [address] [place], [place], [address] Phone: [phone]   Fax:  [phone]  Name: [name] MRN: [id number] Date of Birth: 1950/11/09

## 2021-03-02 NOTE — Therapy (Signed)
[place] MAIN [facility] SERVICES [address] [place], [place], [address] Phone: [phone]   Fax:  [phone]  Speech Language Pathology Treatment  Patient Details  Name: [name] MRN: [id number] Date of Birth: March 27, 1951 Referring Provider (SLP): Elsie Stain   Encounter Date: 03/01/2021   End of Session - 03/02/21 1316     Visit Number 16    Number of Visits 17    Date for SLP Re-Evaluation 03/04/21    Authorization Type [employer] Medicare Choice PPO    Authorization Time Period 12/08/2020 thru 03/04/2021    Authorization - Visit Number 6    Progress Note Due on Visit 10    SLP Start Time 1000    SLP Stop Time  1100    SLP Time Calculation (min) 60 min    Activity Tolerance Patient tolerated treatment well             Past Medical History:  Diagnosis Date   Arthritis    Bradycardia    Cancer ([place]) 2013   skin cancer   Depression    ptsd   Dysrhythmia    chronic slow heart rate   GERD (gastroesophageal reflux disease)    Headache(784.0)    tension headaches non recent   History of chicken pox    History of kidney stones    passed   Hypertension    treated with HCTZ   Pacemaker    Parkinson's disease ([place])    dx'ed 15 years ago   PTSD (post-traumatic stress disorder)    Shortness of breath dyspnea    Sleep apnea    doesn't use C-pap   Varicose veins     Past Surgical History:  Procedure Laterality Date   CHOLECYSTECTOMY N/A 10/22/2014   Procedure: LAPAROSCOPIC CHOLECYSTECTOMY WITH INTRAOPERATIVE CHOLANGIOGRAM;  Surgeon: Dia Crawford III, MD;  Location: [facility];  Service: General;  Laterality: N/A;   cyst removed      from lip as a child   INTRAMEDULLARY (IM) NAIL INTERTROCHANTERIC N/A 02/18/2019   Procedure: INTRAMEDULLARY (IM) NAIL INTERTROCHANTRIC, RIGHT,;  Surgeon: Thornton Park, MD;  Location: [facility]S;  Service: Orthopedics;  Laterality: N/A;   LUMBAR LAMINECTOMY/DECOMPRESSION MICRODISCECTOMY  Bilateral 12/14/2012   Procedure: Bilateral lumbar three-four, four-five decompressive laminotomy/foraminotomy;  Surgeon: Charlie Pitter, MD;  Location: [place] [facility];  Service: Neurosurgery;  Laterality: Bilateral;   PULSE GENERATOR IMPLANT Bilateral 12/13/2013   Procedure: Bilateral implantable pulse generator placement;  Surgeon: Erline Levine, MD;  Location: [place] [facility];  Service: Neurosurgery;  Laterality: Bilateral;  Bilateral implantable pulse generator placement   skin cancer removed     from ears,   12 lft arm  rt leg 15   SUBTHALAMIC STIMULATOR BATTERY REPLACEMENT Bilateral 07/14/2017   Procedure: BILATERAL IMPLANTED PULSE GENERATOR CHANGE FOR DEEP BRAIN STIMULATOR;  Surgeon: Erline Levine, MD;  Location: [place];  Service: Neurosurgery;  Laterality: Bilateral;   SUBTHALAMIC STIMULATOR INSERTION Bilateral 12/06/2013   Procedure: SUBTHALAMIC STIMULATOR INSERTION;  Surgeon: Erline Levine, MD;  Location: [place] [facility];  Service: Neurosurgery;  Laterality: Bilateral;  Bilateral deep brain stimulator placement    There were no vitals filed for this visit.   Subjective Assessment - 03/02/21 1303     Subjective Pt pleasant, friendly    Currently in Pain? No/denies                   ADULT SLP TREATMENT - 03/02/21 0001       Treatment Provided  Treatment provided Cognitive-Linquistic      Cognitive-Linquistic Treatment   Treatment focused on Cognition;Dysarthria;Patient/family/caregiver education    Skilled Treatment COGNITIVE COMMUNICATION: Skilled treatment session focused on pt's speech intelligibility and decreasing pt's response times. SLP facilitated session by providing structured questions to elicit timely responses and verbal cues for repetition to increase pt's pseech intelligiblity. Pt required continual cues to repeat himself with speech intelligibility improving to ~ 50% at the sentence level d/t fast rate and imprecise articulation. While having structured questions didn't  improve pt's response time, it was helpful in aiding pt in expressing more information spontaneously.              SLP Education - 03/02/21 1316     Education Details provided    Person(s) Educated Patient    Methods Explanation    Comprehension Need further instruction              SLP Short Term Goals - 02/08/21 1720       SLP SHORT TERM GOAL #1   Title Pt will maintain speech volume appropriate for 60% intelligibilty at the sentence level with minimal cues.    Baseline goal revised, 02/08/2021    Time 10    Period --   sessions   Status Revised      SLP SHORT TERM GOAL #2   Title Pt will use 1 external compensatory memory aid to recall functional information with minimal assistance.    Baseline goal revised, 02/08/2021    Time 10    Period --   sessions   Status Revised      SLP SHORT TERM GOAL #3   Title Pt will sustain attention to basic task for 1 minute with moderate assistance.    Time 10    Period --   sessions   Status On-going              SLP Long Term Goals - 02/08/21 1722       SLP LONG TERM GOAL #1   Title Pt will use speech intelligibility strategies to achieve ~ 80% speech intelligiblity at the sentence level with intermittent supervision cues.    Status On-going    Target Date 03/04/21      SLP LONG TERM GOAL #2   Title Patient will identify cognitive-communication barriers and participate in developing functional compensatory strategies.    Status On-going    Target Date 03/04/21      SLP LONG TERM GOAL #3   Title Pt will complete cognitive communication HEP.    Status On-going    Target Date 03/04/21              Plan - 03/02/21 1317     Clinical Impression Statement Pt demonstrated independent use of compensatory memory strategies to recall information. While pt continues to present with moderate to severe cognitive communication impairments, he is engaging in more cognitive activities throughout his day. Pt continues to  struggle with speech intelligibility deficits. Further patient is unable to identify errors in speech requiring SLP cues to utilize strategies to rectify communcation breakdowns. Skilled ST intervention continues to be indicated to increase pt's cognitive communication ability and speech by helping pt and his wife identify and overcome barriers to pt's independence.    Speech Therapy Frequency 2x / week    Duration 8 weeks    Treatment/Interventions Compensatory techniques;Internal/external aids;Functional tasks;SLP instruction and feedback;Patient/family education    Potential to Achieve Goals Fair    Potential Considerations Ability  to learn/carryover information;Medical prognosis;Severity of impairments;Co-morbidities;Previous level of function    Consulted and Agree with Plan of Care Patient             Patient will benefit from skilled therapeutic intervention in order to improve the following deficits and impairments:   Cognitive communication deficit  Parkinson's disease ([place])  Dementia associated with Parkinson's disease ([place])    Problem List Patient Active Problem List   Diagnosis Date Noted   Sinus drainage 12/09/2020   Medicare annual wellness visit, subsequent 11/18/2020   Pacemaker    Dementia associated with Parkinson's disease ([place]) 06/17/2020   Vertigo 02/19/2020   Urine abnormality 02/19/2020   Dysphagia 02/23/2019   Fall at home 10/18/2017   REM behavioral disorder 11/02/2016   Radicular pain in right arm 10/09/2016   GERD (gastroesophageal reflux disease) 07/31/2016   Colon cancer screening 07/23/2015   Gout 02/18/2015   Depression 01/06/2015   Skin lesion 10/02/2014   Dupuytren's contracture 08/20/2014   Cough 07/21/2014   Lumbar stenosis with neurogenic claudication 06/13/2014   S/P deep brain stimulator placement 05/08/2014   Advance care planning 01/19/2014   Parkinson's disease ([place]) 12/13/2013   PTSD (post-traumatic stress disorder) 06/13/2013    Erectile dysfunction 06/13/2013   HLD (hyperlipidemia) 06/13/2013   Essential hypertension 06/03/2013   Bradycardia by electrocardiogram 06/03/2013   Obstructive sleep apnea 03/12/2013   Fabian Walder B. Rutherford Nail M.S., CCC-SLP, [place] Pathologist Rehabilitation Services Office [phone]  Stormy Fabian 03/02/2021, 1:18 PM  [place] MAIN [facility] SERVICES [address] [place], [place], [address] Phone: [phone]   Fax:  [phone]   Name: [name] MRN: [id number] Date of Birth: 05/19/1950

## 2021-03-02 NOTE — Progress Notes (Signed)
Remote pacemaker transmission.   

## 2021-03-03 ENCOUNTER — Ambulatory Visit: Payer: Medicare PPO | Admitting: Speech Pathology

## 2021-03-03 ENCOUNTER — Ambulatory Visit: Payer: Medicare PPO | Admitting: Physical Therapy

## 2021-03-04 ENCOUNTER — Other Ambulatory Visit: Payer: Self-pay

## 2021-03-04 ENCOUNTER — Ambulatory Visit: Payer: Medicare PPO | Admitting: Speech Pathology

## 2021-03-04 DIAGNOSIS — G20 Parkinson's disease: Secondary | ICD-10-CM

## 2021-03-04 DIAGNOSIS — R262 Difficulty in walking, not elsewhere classified: Secondary | ICD-10-CM | POA: Diagnosis not present

## 2021-03-04 DIAGNOSIS — R2689 Other abnormalities of gait and mobility: Secondary | ICD-10-CM | POA: Diagnosis not present

## 2021-03-04 DIAGNOSIS — R2681 Unsteadiness on feet: Secondary | ICD-10-CM | POA: Diagnosis not present

## 2021-03-04 DIAGNOSIS — R41841 Cognitive communication deficit: Secondary | ICD-10-CM

## 2021-03-04 DIAGNOSIS — G20A1 Parkinson's disease without dyskinesia, without mention of fluctuations: Secondary | ICD-10-CM

## 2021-03-04 DIAGNOSIS — R296 Repeated falls: Secondary | ICD-10-CM | POA: Diagnosis not present

## 2021-03-04 DIAGNOSIS — R278 Other lack of coordination: Secondary | ICD-10-CM | POA: Diagnosis not present

## 2021-03-04 DIAGNOSIS — M6281 Muscle weakness (generalized): Secondary | ICD-10-CM | POA: Diagnosis not present

## 2021-03-04 DIAGNOSIS — R269 Unspecified abnormalities of gait and mobility: Secondary | ICD-10-CM | POA: Diagnosis not present

## 2021-03-04 NOTE — Patient Instructions (Signed)
Provide list of activities that pt can choose to complete with caregiver

## 2021-03-05 NOTE — Therapy (Signed)
[place] MAIN [facility] SERVICES [address] [place], [place], [address] Phone: [phone]   Fax:  [phone]  Speech Language Pathology Treatment RECERTIFICATION  Patient Details  Name: [name] MRN: [id number] Date of Birth: [birth year] Referring Provider (SLP): Elsie Stain   Encounter Date: 03/04/2021   End of Session - 03/04/21 2226     Visit Number 17    Number of Visits 27    Date for SLP Re-Evaluation 04/09/21    Authorization Type [employer] Medicare Choice PPO    Authorization Time Period 03/04/2021 thru 04/09/2021    Authorization - Visit Number 7    Progress Note Due on Visit 10    SLP Start Time 1400    SLP Stop Time  1515    SLP Time Calculation (min) 75 min    Activity Tolerance Patient tolerated treatment well             Past Medical History:  Diagnosis Date   Arthritis    Bradycardia    Cancer ([place]) 2013   skin cancer   Depression    ptsd   Dysrhythmia    chronic slow heart rate   GERD (gastroesophageal reflux disease)    Headache(784.0)    tension headaches non recent   History of chicken pox    History of kidney stones    passed   Hypertension    treated with HCTZ   Pacemaker    Parkinson's disease ([place])    dx'ed 15 years ago   PTSD (post-traumatic stress disorder)    Shortness of breath dyspnea    Sleep apnea    doesn't use C-pap   Varicose veins     Past Surgical History:  Procedure Laterality Date   CHOLECYSTECTOMY N/A 10/22/2014   Procedure: LAPAROSCOPIC CHOLECYSTECTOMY WITH INTRAOPERATIVE CHOLANGIOGRAM;  Surgeon: Dia Crawford III, MD;  Location: [facility] ORS;  Service: General;  Laterality: N/A;   cyst removed      from lip as a child   INTRAMEDULLARY (IM) NAIL INTERTROCHANTERIC N/A 02/18/2019   Procedure: INTRAMEDULLARY (IM) NAIL INTERTROCHANTRIC, RIGHT,;  Surgeon: Thornton Park, MD;  Location: [facility] ORS;  Service: Orthopedics;  Laterality: N/A;   LUMBAR LAMINECTOMY/DECOMPRESSION  MICRODISCECTOMY Bilateral 12/14/2012   Procedure: Bilateral lumbar three-four, four-five decompressive laminotomy/foraminotomy;  Surgeon: Charlie Pitter, MD;  Location: [place] [facility];  Service: Neurosurgery;  Laterality: Bilateral;   PULSE GENERATOR IMPLANT Bilateral 12/13/2013   Procedure: Bilateral implantable pulse generator placement;  Surgeon: Erline Levine, MD;  Location: [place] [facility];  Service: Neurosurgery;  Laterality: Bilateral;  Bilateral implantable pulse generator placement   skin cancer removed     from ears,   12 lft arm  rt leg 15   SUBTHALAMIC STIMULATOR BATTERY REPLACEMENT Bilateral 07/14/2017   Procedure: BILATERAL IMPLANTED PULSE GENERATOR CHANGE FOR DEEP BRAIN STIMULATOR;  Surgeon: Erline Levine, MD;  Location: [place];  Service: Neurosurgery;  Laterality: Bilateral;   SUBTHALAMIC STIMULATOR INSERTION Bilateral 12/06/2013   Procedure: SUBTHALAMIC STIMULATOR INSERTION;  Surgeon: Erline Levine, MD;  Location: [place] [facility];  Service: Neurosurgery;  Laterality: Bilateral;  Bilateral deep brain stimulator placement    There were no vitals filed for this visit.   Subjective Assessment - 03/04/21 2217     Subjective pt pleasant, accompanied by his caregiver [name])    Patient is accompained by: --   caregiver   Currently in Pain? No/denies  ADULT SLP TREATMENT - 03/04/21 2233       Treatment Provided   Treatment provided Cognitive-Linquistic      Cognitive-Linquistic Treatment   Treatment focused on Cognition;Dysarthria;Patient/family/caregiver education    Skilled Treatment COGNITIVE COMMUNICATION: Skilled treatment session focused pt's cognition deficits. SLP facilitated session by providng list of activities that he can pick from to engage in with caregiver. SLP further instructed caregiver using puzzle to increase pt's sustained attention to task as well as how to facilitate basic problem solving. Pt demonstrated sustained attention to task for > 45  minutes which is much improved over previous sessions.              SLP Education - 03/04/21 2224     Education Details ways to facilitated cognitive communicaiton function during the day    Person(s) Educated Patient    Methods Explanation;Demonstration;Verbal cues;Handout    Comprehension Verbalized understanding;Need further instruction              SLP Short Term Goals - 03/05/21 1557       SLP SHORT TERM GOAL #1   Title Pt will use a good quality loud voice to choice an activity from list in 8 out of 10 opportunities.    Baseline goal revised, 03/04/2021    Time 5    Period --   sessions   Status Revised      SLP SHORT TERM GOAL #2   Title Pt will use 1 external compensatory memory aid to recall location of schedule and refer to it.    Baseline goal revised, 03/04/2021    Time 5    Period --   sessions   Status Deferred      SLP SHORT TERM GOAL #3   Title Pt will sustain attention to basic task for 1 minute with moderate assistance.    Time 5    Period --   sessions   Status On-going              SLP Long Term Goals - 03/05/21 1600       SLP LONG TERM GOAL #1   Title Pt will use speech intelligibility strategies to achieve ~ 80% speech intelligiblity when selecting activities with intermittent supervision cues.    Baseline goal revised, 03/04/2021    Time 4    Period Weeks    Status Revised    Target Date 04/02/21      SLP LONG TERM GOAL #2   Title Patient will identify cognitive-communication barriers and participate in developing functional compensatory strategies.    Time 4    Period Weeks    Status Partially Met    Target Date 04/02/21              Plan - 03/05/21 1555     Clinical Impression Statement Pt continues to struggle with task initiation and therefore his ability to participate in cognitive activities is limited. Request recertification for several additional sessions to complete caregiver education with goals to create  list of activities as well as schedule for pt to follow to promote cognitive interaction throughout his day.    Speech Therapy Frequency 2x / week    Duration 4 weeks    Treatment/Interventions Compensatory techniques;Internal/external aids;Functional tasks;SLP instruction and feedback;Patient/family education    Potential to Achieve Goals Fair    Potential Considerations Ability to learn/carryover information;Medical prognosis;Severity of impairments;Co-morbidities;Previous level of function    SLP Home Exercise Plan provided, see pt instructions section  Consulted and Agree with Plan of Care Patient             Patient will benefit from skilled therapeutic intervention in order to improve the following deficits and impairments:   Cognitive communication deficit  Parkinson's disease ([place])  Dementia associated with Parkinson's disease ([place])    Problem List Patient Active Problem List   Diagnosis Date Noted   Sinus drainage 12/09/2020   Medicare annual wellness visit, subsequent 11/18/2020   Pacemaker    Dementia associated with Parkinson's disease ([employer]) 06/17/2020   Vertigo 02/19/2020   Urine abnormality 02/19/2020   Dysphagia 02/23/2019   Fall at home 10/18/2017   REM behavioral disorder 11/02/2016   Radicular pain in right arm 10/09/2016   GERD (gastroesophageal reflux disease) 07/31/2016   Colon cancer screening 07/23/2015   Gout 02/18/2015   Depression 01/06/2015   Skin lesion 10/02/2014   Dupuytren's contracture 08/20/2014   Cough 07/21/2014   Lumbar stenosis with neurogenic claudication 06/13/2014   S/P deep brain stimulator placement 05/08/2014   Advance care planning 01/19/2014   Parkinson's disease ([place]) 12/13/2013   PTSD (post-traumatic stress disorder) 06/13/2013   Erectile dysfunction 06/13/2013   HLD (hyperlipidemia) 06/13/2013   Essential hypertension 06/03/2013   Bradycardia by electrocardiogram 06/03/2013   Obstructive sleep apnea 03/12/2013    Rhilyn Battle B. Rutherford Nail M.S., CCC-SLP, [place] Pathologist Rehabilitation Services Office [phone]  Stormy Fabian 03/05/2021, 4:01 PM  [place] MAIN [facility] SERVICES [address] [place], [place], [address] Phone: [phone]   Fax:  [phone]   Name: BENANCIO OSMUNDSON MRN: [id number] Date of Birth: 09-14-1950

## 2021-03-06 NOTE — Progress Notes (Addendum)
Agree.  Thanks.   Physician Documentation  Your signature is required to indicate approval of the treatment plan as stated above. By signing this report, you are approving the plan of care. Please sign and either send electronically or print and fax the signed copy to the number below. If you approve with modifications, please indicate those in the space provided.__ Physician Signature: ___Graham Damita Dunnings Date:__11/19/22 Time:_2:07 PM

## 2021-03-08 ENCOUNTER — Ambulatory Visit: Payer: Medicare PPO | Admitting: Physical Therapy

## 2021-03-08 ENCOUNTER — Other Ambulatory Visit: Payer: Self-pay

## 2021-03-08 ENCOUNTER — Ambulatory Visit: Payer: Medicare PPO | Admitting: Speech Pathology

## 2021-03-08 DIAGNOSIS — R269 Unspecified abnormalities of gait and mobility: Secondary | ICD-10-CM

## 2021-03-08 DIAGNOSIS — M6281 Muscle weakness (generalized): Secondary | ICD-10-CM | POA: Diagnosis not present

## 2021-03-08 DIAGNOSIS — R262 Difficulty in walking, not elsewhere classified: Secondary | ICD-10-CM | POA: Diagnosis not present

## 2021-03-08 DIAGNOSIS — R296 Repeated falls: Secondary | ICD-10-CM

## 2021-03-08 DIAGNOSIS — R278 Other lack of coordination: Secondary | ICD-10-CM | POA: Diagnosis not present

## 2021-03-08 DIAGNOSIS — G20 Parkinson's disease: Secondary | ICD-10-CM | POA: Diagnosis not present

## 2021-03-08 DIAGNOSIS — R41841 Cognitive communication deficit: Secondary | ICD-10-CM | POA: Diagnosis not present

## 2021-03-08 DIAGNOSIS — R2681 Unsteadiness on feet: Secondary | ICD-10-CM | POA: Diagnosis not present

## 2021-03-08 DIAGNOSIS — R2689 Other abnormalities of gait and mobility: Secondary | ICD-10-CM | POA: Diagnosis not present

## 2021-03-08 NOTE — Therapy (Signed)
[place] MAIN [facility] SERVICES [address] [place], [place], [address] Phone: [phone]   Fax:  [phone]  Physical Therapy Treatment/ Recertification/ Physical Therapy Progress Note   Dates of reporting period  01/11/21   to   03/08/21   Patient Details  Name: [name] MRN: [id number] Date of Birth: 1951/02/21 Referring Provider (PT): Elsie Stain, MD   Encounter Date: 03/08/2021   PT End of Session - 03/08/21 1110     Visit Number 20    Number of Visits 48    Date for PT Re-Evaluation 05/31/21    Authorization Type [employer] Medicare Choice PPO    Authorization Time Period recert for 0/56-97/94    Progress Note Due on Visit 20    PT Start Time 1100    PT Stop Time 1147    PT Time Calculation (min) 47 min    Equipment Utilized During Treatment Gait belt    Activity Tolerance Patient tolerated treatment well    Behavior During Therapy [facility] for tasks assessed/performed             Past Medical History:  Diagnosis Date   Arthritis    Bradycardia    Cancer ([place]) 2013   skin cancer   Depression    ptsd   Dysrhythmia    chronic slow heart rate   GERD (gastroesophageal reflux disease)    Headache(784.0)    tension headaches non recent   History of chicken pox    History of kidney stones    passed   Hypertension    treated with HCTZ   Pacemaker    Parkinson's disease ([place])    dx'ed 15 years ago   PTSD (post-traumatic stress disorder)    Shortness of breath dyspnea    Sleep apnea    doesn't use C-pap   Varicose veins     Past Surgical History:  Procedure Laterality Date   CHOLECYSTECTOMY N/A 10/22/2014   Procedure: LAPAROSCOPIC CHOLECYSTECTOMY WITH INTRAOPERATIVE CHOLANGIOGRAM;  Surgeon: Dia Crawford III, MD;  Location: [facility] ORS;  Service: General;  Laterality: N/A;   cyst removed      from lip as a child   INTRAMEDULLARY (IM) NAIL INTERTROCHANTERIC N/A 02/18/2019   Procedure: INTRAMEDULLARY (IM) NAIL  INTERTROCHANTRIC, RIGHT,;  Surgeon: Thornton Park, MD;  Location: [facility] ORS;  Service: Orthopedics;  Laterality: N/A;   LUMBAR LAMINECTOMY/DECOMPRESSION MICRODISCECTOMY Bilateral 12/14/2012   Procedure: Bilateral lumbar three-four, four-five decompressive laminotomy/foraminotomy;  Surgeon: Charlie Pitter, MD;  Location: [place] [facility];  Service: Neurosurgery;  Laterality: Bilateral;   PULSE GENERATOR IMPLANT Bilateral 12/13/2013   Procedure: Bilateral implantable pulse generator placement;  Surgeon: Erline Levine, MD;  Location: [place] [facility] OR[facility];  Service: Neurosurgery;  Laterality: Bilateral;  Bilateral implantable pulse generator placement   skin cancer removed     from ears,   12 lft arm  rt leg 15   SUBTHALAMIC STIMULATOR BATTERY REPLACEMENT Bilateral 07/14/2017   Procedure: BILATERAL IMPLANTED PULSE GENERATOR CHANGE FOR DEEP BRAIN STIMULATOR;  Surgeon: Erline Levine, MD;  Location: [place];  Service: Neurosurgery;  Laterality: Bilateral;   SUBTHALAMIC STIMULATOR INSERTION Bilateral 12/06/2013   Procedure: SUBTHALAMIC STIMULATOR INSERTION;  Surgeon: Erline Levine, MD;  Location: [place] [facility] OR[facility];  Service: Neurosurgery;  Laterality: Bilateral;  Bilateral deep brain stimulator placement    There were no vitals filed for this visit.   Subjective Assessment - 03/08/21 1101     Subjective Pt continues to report a fall within the past week. States  it happened when he awoke from a dream in the middle of the night.  Patient also reports and wife reports he like to work on core strengthening to improve ability to don and doff pants and footwear.    Patient is accompained by: Family member    Pertinent History [name] is a 80yoM who comes to [facility] OPPT neuro for evaluation of conitnued difficulty of imbalance, unsteadiness on feet, and generally limited tolerance to mobility in the setting of chronic parkinsonism. SInce DC in February, pt has had PPM placement due to bradycardia issues. Pt also started using an  UpWalker device 2 week prior to this evaluation, goal was to help with gradual melting of postural extension that occured with sustained upright actiivty. Pt has continued to practice supervised AMB with caregivers, short distances out of house and in community. Pt continues to experience frequent falls on a weekly basis, at a rate that both he and wife report to be increasing in frequency. PMH: HTN, bradycardia s/p PPM, PD, DBS, COVID-19 infection, chronic low back pain s/p L4/5 surgery.    Limitations Walking;Standing;Sitting;House hold activities;Lifting    How long can you sit comfortably? Not limited    How long can you stand comfortably? 5 minutes with support    How long can you walk comfortably? ~5-6 minutes UpWalker Walker    Patient Stated Goals Reduce falls, improve AMB tolerance    Currently in Pain? No/denies    Pain Score 0-No pain    Pain Onset More than a month ago    Pain Onset More than a month ago                [facility] PT Assessment - 03/08/21 0001       Mini-BESTest   Sit To Stand Normal: Comes to stand without use of hands and stabilizes independently.    Rise to Toes Moderate: Heels up, but not full range (smaller than when holding hands), OR noticeable instability for 3 s.    Stand on one leg (left) Moderate: < 20 s   6 sec   Stand on one leg (right) Moderate: < 20 s   3 sec   Stand on one leg - lowest score 1    Compensatory Stepping Correction - Forward Moderate: More than one step is required to recover equilibrium    Compensatory Stepping Correction - Backward No step, OR would fall if not caught, OR falls spontaneously.    Compensatory Stepping Correction - Left Lateral Severe: Falls, or cannot step    Compensatory Stepping Correction - Right Lateral Severe:  Falls, or cannot step    Stepping Corredtion Lateral - lowest score 0    Stance - Feet together, eyes open, firm surface  Normal: 30s    Stance - Feet together, eyes closed, foam surface  Moderate: <  30s    Incline - Eyes Closed Moderate: Stands independently < 30s OR aligns with surface    Change in Gait Speed Normal: Significantly changes walkling speed without imbalance    Walk with head turns - Horizontal Normal: performs head turns with no change in gait speed and good balance    Walk with pivot turns Moderate:Turns with feet close SLOW (>4 steps) with good balance.    Step over obstacles Moderate: Steps over box but touches box OR displays cautious behavior by slowing gait.    Timed UP & GO with Dual Task Severe: Stops counting while walking OR stops walking while counting.  Mini-BEST total score 15                Physical therapy treatment session today consisted of completing assessment of goals and administration of testing as demonstrated in flow sheet. Addition treatments may be found below.                      PT Education - 03/08/21 1657     Education provided Yes    Education Details Progress with therapy thus far    Person(s) Educated Patient;Spouse    Methods Explanation    Comprehension Verbalized understanding              PT Short Term Goals - 03/08/21 1125       PT SHORT TERM GOAL #1   Title Pt to demonstrate improved sustained AMB tolerance >857ft.    Baseline At evlauation: 644ft tolerance (not to failure); 9/26: 1384 ft    Time 4    Period Weeks    Status Achieved    Target Date 01/05/21      PT SHORT TERM GOAL #2   Title Pt to demonstrate [place] >17/28 to show improvement in balance.    Baseline Eval: 15/28; 9/26: deferred, 03/08/21: 15/28    Time 4    Period Weeks    Status On-going    Target Date 02/08/21      PT SHORT TERM GOAL #3   Title Pt to demonstrate 10MWT<13sec to show self-selected gait speed appropriate for AMB outside of house and to improve energy efficiency.    Baseline UpWalker Eval: 17.53sec; 9/26: 0.74 m/s (13.5)11/21: 13.68 (.73 m/s)    Time 4    Period Weeks    Status Partially Met     Target Date 02/08/21               PT Long Term Goals - 03/08/21 1127       PT LONG TERM GOAL #1   Title Pt to score MiniBesTest > 20/28 to show improved balance.    Baseline Eval: 15/28; 9/26: deferred 1/21: 15/28    Time 8    Period Weeks    Status On-going      PT LONG TERM GOAL #2   Title Pt to tolerate 1033ft sustained AMB with UpWalker without rest interval to improve safety and independence in limited community AMB.    Baseline At eval; tired after 651ft; 9/26: 1384 ft    Time 8    Period Weeks    Status Achieved      PT LONG TERM GOAL #3   Title Pt to demonstrate improved FOTO score by 5 points compared to initital assessment.    Baseline 9/26: 49 (eval was 50) 911/21: 52    Time 8    Period Weeks    Status On-going      PT LONG TERM GOAL #4   Title Pt to show tolerance of AMB>1320ft with UpWalker to improve tolerance to accessing the community for IADL.    Baseline Eval: 625ft;  9/26: 1385    Time 12    Period Weeks    Status Achieved      PT LONG TERM GOAL #5   Title Patient will increase six minute walk test distance to >1000 for progression to community ambulator and improve gait ability    Baseline 9/26: new 11/21: 850 feet with up Walker    Time 8    Period Weeks    Status  New                   Plan - 03/08/21 1657     Clinical Impression Statement Patient presents to physical therapy for progress note and recertification.  During today's session patient's goals were assessed.  Patient demonstrates progress with ambulation speed and distance with 6-minute walk test was able to complete test without significant fatigue.  Patient also demonstrates progress with standing activities and was able to stand for prolonged period during mini best test.  Patient has not made significant progress with mini best test, however, other balance tests such as Berg balance may be more appropriate for his level of functioning and may be reassessed in future  sessions.  Patient notes he has continued to have falls and notes 2 falls over the last several weeks.  1 fall was in the yard performing yard work and other fall was at night when he woke up from a dream.  Patient will continue to benefit from skilled physical therapy intervention in order to improve his safety with everyday tasks involving ambulation and prolonged standing, will also benefit from continued physical therapy in order to improve his lower extremity strength and general balance. Patient's condition has the potential to improve in response to therapy. Maximum improvement is yet to be obtained. The anticipated improvement is attainable and reasonable in a generally predictable time.      Personal Factors and Comorbidities Age;Past/Current Experience;Time since onset of injury/illness/exacerbation;Transportation    Comorbidities Chronic back pain, orthostatic hypotension, repeated falls, parkinsonism    Examination-Activity Limitations Bathing;Bed Mobility;Caring for Others;Bend;Dressing;Lift;Locomotion Level;Reach Overhead;Sit;Squat;Stairs;Stand;Toileting;Transfers;Carry    Examination-Participation Restrictions Cleaning;Community Activity;Interpersonal Relationship;Shop;Volunteer;Yard Work;Other;Driving    Stability/Clinical Decision Making Evolving/Moderate complexity    Rehab Potential Fair    PT Frequency 2x / week    PT Duration 12 weeks    PT Treatment/Interventions ADLs/Self Care Home Management;Aquatic Therapy;Cryotherapy;Electrical Stimulation;Iontophoresis 4mg /ml Dexamethasone;Moist Heat;Ultrasound;Contrast Bath;DME Instruction;Gait training;Stair training;Functional mobility training;Therapeutic activities;Therapeutic exercise;Balance training;Neuromuscular re-education;Patient/family education;Manual techniques;Wheelchair mobility training;Energy conservation;Passive range of motion;Traction;Orthotic Fit/Training;Dry needling;Vestibular;Visual/perceptual  remediation/compensation;Taping;Canalith Repostioning;Cognitive remediation    PT Next Visit Plan Advanced AMB tolerance, use MiniBesTest to guide balance interventions seated rotation and balance exercises    PT Home Exercise Plan None set up at evaluation, still working on previous HEP from 6 months prior (STS, daily walking); 8/29: issued STS with focus on large amplitude movement at support surface; 9/12: PT provides handout with sit to stand exercise and large amplitude seated trunk rotation exercise.  9/19: reinforced HEP, provided handout for seated postural exercise with UE abduction, and seated exercise with trunk twists and UE reach, 10x for each, can be performed daily or minimum of 4x/week. No changes today.    Consulted and Agree with Plan of Care Patient;Family member/caregiver    Family Member Consulted Wife             Patient will benefit from skilled therapeutic intervention in order to improve the following deficits and impairments:  Abnormal gait, Decreased balance, Decreased coordination, Decreased mobility, Postural dysfunction, Decreased strength, Decreased safety awareness, Improper body mechanics, Impaired flexibility, Decreased activity tolerance, Decreased endurance, Decreased knowledge of precautions, Difficulty walking, Pain, Cardiopulmonary status limiting activity, Decreased range of motion, Impaired perceived functional ability, Decreased cognition, Dizziness  Visit Diagnosis: Abnormality of gait and mobility  Difficulty in walking, not elsewhere classified  Unsteadiness on feet  Repeated falls     Problem List Patient Active Problem List   Diagnosis Date Noted   Sinus drainage 12/09/2020  Medicare annual wellness visit, subsequent 11/18/2020   Pacemaker    Dementia associated with Parkinson's disease ([place]) 06/17/2020   Vertigo 02/19/2020   Urine abnormality 02/19/2020   Dysphagia 02/23/2019   Fall at home 10/18/2017   REM behavioral disorder  11/02/2016   Radicular pain in right arm 10/09/2016   GERD (gastroesophageal reflux disease) 07/31/2016   Colon cancer screening 07/23/2015   Gout 02/18/2015   Depression 01/06/2015   Skin lesion 10/02/2014   Dupuytren's contracture 08/20/2014   Cough 07/21/2014   Lumbar stenosis with neurogenic claudication 06/13/2014   S/P deep brain stimulator placement 05/08/2014   Advance care planning 01/19/2014   Parkinson's disease ([place]) 12/13/2013   PTSD (post-traumatic stress disorder) 06/13/2013   Erectile dysfunction 06/13/2013   HLD (hyperlipidemia) 06/13/2013   Essential hypertension 06/03/2013   Bradycardia by electrocardiogram 06/03/2013   Obstructive sleep apnea 03/12/2013    Particia Lather, PT 03/08/2021, 5:09 PM  [place] MAIN [facility] SERVICES [address] [place], [place], [address] Phone: [phone]   Fax:  [phone]  Name: [name] MRN: [id number] Date of Birth: 11-25-1950

## 2021-03-10 ENCOUNTER — Other Ambulatory Visit: Payer: Self-pay

## 2021-03-10 ENCOUNTER — Ambulatory Visit: Payer: Medicare PPO | Admitting: Speech Pathology

## 2021-03-10 ENCOUNTER — Ambulatory Visit: Payer: Medicare PPO | Admitting: Physical Therapy

## 2021-03-10 DIAGNOSIS — R2681 Unsteadiness on feet: Secondary | ICD-10-CM | POA: Diagnosis not present

## 2021-03-10 DIAGNOSIS — R269 Unspecified abnormalities of gait and mobility: Secondary | ICD-10-CM

## 2021-03-10 DIAGNOSIS — R262 Difficulty in walking, not elsewhere classified: Secondary | ICD-10-CM | POA: Diagnosis not present

## 2021-03-10 DIAGNOSIS — R278 Other lack of coordination: Secondary | ICD-10-CM | POA: Diagnosis not present

## 2021-03-10 DIAGNOSIS — R296 Repeated falls: Secondary | ICD-10-CM | POA: Diagnosis not present

## 2021-03-10 DIAGNOSIS — R41841 Cognitive communication deficit: Secondary | ICD-10-CM | POA: Diagnosis not present

## 2021-03-10 DIAGNOSIS — R2689 Other abnormalities of gait and mobility: Secondary | ICD-10-CM | POA: Diagnosis not present

## 2021-03-10 DIAGNOSIS — G20 Parkinson's disease: Secondary | ICD-10-CM | POA: Diagnosis not present

## 2021-03-10 DIAGNOSIS — M6281 Muscle weakness (generalized): Secondary | ICD-10-CM | POA: Diagnosis not present

## 2021-03-10 NOTE — Therapy (Addendum)
[place] MAIN [facility] SERVICES [address] [place], [place], [address] Phone: [phone]   Fax:  [phone]  Physical Therapy Treatment  Patient Details  Name: [name] MRN: [id number] Date of Birth: May 02, 1950 Referring Provider (PT): Elsie Stain, MD   Encounter Date: 03/10/2021     Past Medical History:  Diagnosis Date   Arthritis    Bradycardia    Cancer ([place]) 2013   skin cancer   Depression    ptsd   Dysrhythmia    chronic slow heart rate   GERD (gastroesophageal reflux disease)    Headache(784.0)    tension headaches non recent   History of chicken pox    History of kidney stones    passed   Hypertension    treated with HCTZ   Pacemaker    Parkinson's disease ([place])    dx'ed 15 years ago   PTSD (post-traumatic stress disorder)    Shortness of breath dyspnea    Sleep apnea    doesn't use C-pap   Varicose veins     Past Surgical History:  Procedure Laterality Date   CHOLECYSTECTOMY N/A 10/22/2014   Procedure: LAPAROSCOPIC CHOLECYSTECTOMY WITH INTRAOPERATIVE CHOLANGIOGRAM;  Surgeon: Dia Crawford III, MD;  Location: [facility] ORS;  Service: General;  Laterality: N/A;   cyst removed      from lip as a child   INTRAMEDULLARY (IM) NAIL INTERTROCHANTERIC N/A 02/18/2019   Procedure: INTRAMEDULLARY (IM) NAIL INTERTROCHANTRIC, RIGHT,;  Surgeon: Thornton Park, MD;  Location: [facility] ORS;  Service: Orthopedics;  Laterality: N/A;   LUMBAR LAMINECTOMY/DECOMPRESSION MICRODISCECTOMY Bilateral 12/14/2012   Procedure: Bilateral lumbar three-four, four-five decompressive laminotomy/foraminotomy;  Surgeon: Charlie Pitter, MD;  Location: [place] [facility];  Service: Neurosurgery;  Laterality: Bilateral;   PULSE GENERATOR IMPLANT Bilateral 12/13/2013   Procedure: Bilateral implantable pulse generator placement;  Surgeon: Erline Levine, MD;  Location: [place] [facility];  Service: Neurosurgery;  Laterality: Bilateral;  Bilateral implantable pulse generator  placement   skin cancer removed     from ears,   12 lft arm  rt leg 15   SUBTHALAMIC STIMULATOR BATTERY REPLACEMENT Bilateral 07/14/2017   Procedure: BILATERAL IMPLANTED PULSE GENERATOR CHANGE FOR DEEP BRAIN STIMULATOR;  Surgeon: Erline Levine, MD;  Location: [place];  Service: Neurosurgery;  Laterality: Bilateral;   SUBTHALAMIC STIMULATOR INSERTION Bilateral 12/06/2013   Procedure: SUBTHALAMIC STIMULATOR INSERTION;  Surgeon: Erline Levine, MD;  Location: [place] [facility];  Service: Neurosurgery;  Laterality: Bilateral;  Bilateral deep brain stimulator placement    There were no vitals filed for this visit.      There were no vitals filed for this visit.  Seated PWR! Up x 10    Seated Hip march 5lb BE 2 x 15 reps -seated on dynddisc and in armless chair to improve core muscle activation  Seated knee ext 5lb BLE 3 x 5 reps ea side, seated on dynadisc to improve core muscle activation   Step 1 x 10 on ea side - lateral easted step with arim to hedgehog, cues to keep upright posture and for adequate movement amplitude    STS with cues for PWR! Up to improve amplitude of movement - 1 x 10  -Pt had significant difficulty with sequencing requiring multiple and frequent cues   Row, seated with RTB 2 x 12 reps with 3 second hold. VC on technique.seated on dynadisc to improve core muscle activation.   Seated horizontal Shoulder abd RTB 2 x 10 reps seated on dynadisc to ipmrove  core muscle activation    Ambulation training with Up walker and metronome, 150 feet at 80 SPM and 150 feet at 86 SPM  -Improved gait speed with use of metronome and pt able to manage increased metronome speeds.    Pt encouraged throughout workout to maintain upright posture as pt tends to lean into trunk flexion.                              PT Short Term Goals - 03/08/21 1125       PT SHORT TERM GOAL #1   Title Pt to demonstrate improved sustained AMB tolerance >853ft.    Baseline At  evlauation: 660ft tolerance (not to failure); 9/26: 1384 ft    Time 4    Period Weeks    Status Achieved    Target Date 01/05/21      PT SHORT TERM GOAL #2   Title Pt to demonstrate [place] >17/28 to show improvement in balance.    Baseline Eval: 15/28; 9/26: deferred, 03/08/21: 15/28    Time 4    Period Weeks    Status On-going    Target Date 02/08/21      PT SHORT TERM GOAL #3   Title Pt to demonstrate 10MWT<13sec to show self-selected gait speed appropriate for AMB outside of house and to improve energy efficiency.    Baseline UpWalker Eval: 17.53sec; 9/26: 0.74 m/s (13.5)11/21: 13.68 (.73 m/s)    Time 4    Period Weeks    Status Partially Met    Target Date 02/08/21               PT Long Term Goals - 03/08/21 1127       PT LONG TERM GOAL #1   Title Pt to score MiniBesTest > 20/28 to show improved balance.    Baseline Eval: 15/28; 9/26: deferred 11/21: 15/28    Time 8    Period Weeks    Status On-going    Target Date 05/31/21      PT LONG TERM GOAL #2   Title Pt to tolerate 1010ft sustained AMB with UpWalker without rest interval to improve safety and independence in limited community AMB.    Baseline At eval; tired after 674ft; 9/26: 1384 ft    Time 8    Period Weeks    Status Achieved    Target Date 05/03/21      PT LONG TERM GOAL #3   Title Pt to demonstrate improved FOTO score by 5 points compared to initital assessment.    Baseline 9/26: 49 (eval was 50) 911/21: 52    Time 8    Period Weeks    Status On-going    Target Date 05/03/21      PT LONG TERM GOAL #4   Title Pt to show tolerance of AMB>1370ft with UpWalker to improve tolerance to accessing the community for IADL.    Baseline Eval: 692ft;  9/26: 1385    Time 12    Period Weeks    Status Achieved    Target Date 05/31/21      PT LONG TERM GOAL #5   Title Patient will increase six minute walk test distance to >1000 for progression to community ambulator and improve gait ability     Baseline 9/26: new 11/21: 850 feet with up Walker    Time 8    Period Weeks    Status On-going    Target  Date 05/03/21                     Patient will benefit from skilled therapeutic intervention in order to improve the following deficits and impairments:  Abnormal gait, Decreased balance, Decreased coordination, Decreased mobility, Postural dysfunction, Decreased strength, Decreased safety awareness, Improper body mechanics, Impaired flexibility, Decreased activity tolerance, Decreased endurance, Decreased knowledge of precautions, Difficulty walking, Pain, Cardiopulmonary status limiting activity, Decreased range of motion, Impaired perceived functional ability, Decreased cognition, Dizziness  Visit Diagnosis: Abnormality of gait and mobility  Difficulty in walking, not elsewhere classified  Unsteadiness on feet  Repeated falls     Problem List Patient Active Problem List   Diagnosis Date Noted   Sinus drainage 12/09/2020   Medicare annual wellness visit, subsequent 11/18/2020   Pacemaker    Dementia associated with Parkinson's disease ([place]) 06/17/2020   Vertigo 02/19/2020   Urine abnormality 02/19/2020   Dysphagia 02/23/2019   Fall at home 10/18/2017   REM behavioral disorder 11/02/2016   Radicular pain in right arm 10/09/2016   GERD (gastroesophageal reflux disease) 07/31/2016   Colon cancer screening 07/23/2015   Gout 02/18/2015   Depression 01/06/2015   Skin lesion 10/02/2014   Dupuytren's contracture 08/20/2014   Cough 07/21/2014   Lumbar stenosis with neurogenic claudication 06/13/2014   S/P deep brain stimulator placement 05/08/2014   Advance care planning 01/19/2014   Parkinson's disease ([place]) 12/13/2013   PTSD (post-traumatic stress disorder) 06/13/2013   Erectile dysfunction 06/13/2013   HLD (hyperlipidemia) 06/13/2013   Essential hypertension 06/03/2013   Bradycardia by electrocardiogram 06/03/2013   Obstructive sleep apnea 03/12/2013     Particia Lather, PT 03/15/2021, 2:20 PM  [place] MAIN [facility] SERVICES [address] [place], [place], [address] Phone: [phone]   Fax:  [phone]  Name: [name] MRN: [id number] Date of Birth: 01-28-1951

## 2021-03-15 ENCOUNTER — Ambulatory Visit: Payer: Medicare PPO | Admitting: Physical Therapy

## 2021-03-15 ENCOUNTER — Other Ambulatory Visit: Payer: Self-pay

## 2021-03-15 ENCOUNTER — Ambulatory Visit: Payer: Medicare PPO | Admitting: Speech Pathology

## 2021-03-15 DIAGNOSIS — R262 Difficulty in walking, not elsewhere classified: Secondary | ICD-10-CM

## 2021-03-15 DIAGNOSIS — R2681 Unsteadiness on feet: Secondary | ICD-10-CM | POA: Diagnosis not present

## 2021-03-15 DIAGNOSIS — R296 Repeated falls: Secondary | ICD-10-CM | POA: Diagnosis not present

## 2021-03-15 DIAGNOSIS — G20A1 Parkinson's disease without dyskinesia, without mention of fluctuations: Secondary | ICD-10-CM

## 2021-03-15 DIAGNOSIS — R269 Unspecified abnormalities of gait and mobility: Secondary | ICD-10-CM | POA: Diagnosis not present

## 2021-03-15 DIAGNOSIS — G20 Parkinson's disease: Secondary | ICD-10-CM

## 2021-03-15 DIAGNOSIS — R2689 Other abnormalities of gait and mobility: Secondary | ICD-10-CM | POA: Diagnosis not present

## 2021-03-15 DIAGNOSIS — R41841 Cognitive communication deficit: Secondary | ICD-10-CM | POA: Diagnosis not present

## 2021-03-15 DIAGNOSIS — M6281 Muscle weakness (generalized): Secondary | ICD-10-CM | POA: Diagnosis not present

## 2021-03-15 DIAGNOSIS — R278 Other lack of coordination: Secondary | ICD-10-CM | POA: Diagnosis not present

## 2021-03-15 NOTE — Therapy (Signed)
[place] MAIN [facility] SERVICES [address] [place], [place], [address] Phone: [phone]   Fax:  [phone]  Physical Therapy Treatment  Patient Details  Name: [name] MRN: [id number] Date of Birth: Sep 06, 1950 Referring Provider (PT): Elsie Stain, MD   Encounter Date: 03/15/2021   PT End of Session - 03/15/21 1109     Visit Number 22    Number of Visits 48    Date for PT Re-Evaluation 05/31/21    Authorization Type [employer] Choice PPO    Authorization Time Period recert for 7/85-88/50    Progress Note Due on Visit 20    PT Start Time 1055    PT Stop Time 1140    PT Time Calculation (min) 45 min    Equipment Utilized During Treatment Gait belt    Activity Tolerance Patient tolerated treatment well    Behavior During Therapy [facility] for tasks assessed/performed             Past Medical History:  Diagnosis Date   Arthritis    Bradycardia    Cancer ([place]) 2013   skin cancer   Depression    ptsd   Dysrhythmia    chronic slow heart rate   GERD (gastroesophageal reflux disease)    Headache(784.0)    tension headaches non recent   History of chicken pox    History of kidney stones    passed   Hypertension    treated with HCTZ   Pacemaker    Parkinson's disease ([place])    dx'ed 15 years ago   PTSD (post-traumatic stress disorder)    Shortness of breath dyspnea    Sleep apnea    doesn't use C-pap   Varicose veins     Past Surgical History:  Procedure Laterality Date   CHOLECYSTECTOMY N/A 10/22/2014   Procedure: LAPAROSCOPIC CHOLECYSTECTOMY WITH INTRAOPERATIVE CHOLANGIOGRAM;  Surgeon: Dia Crawford III, MD;  Location: [facility] ORS;  Service: General;  Laterality: N/A;   cyst removed      from lip as a child   INTRAMEDULLARY (IM) NAIL INTERTROCHANTERIC N/A 02/18/2019   Procedure: INTRAMEDULLARY (IM) NAIL INTERTROCHANTRIC, RIGHT,;  Surgeon: Thornton Park, MD;  Location: [facility] ORS;  Service: Orthopedics;  Laterality:  N/A;   LUMBAR LAMINECTOMY/DECOMPRESSION MICRODISCECTOMY Bilateral 12/14/2012   Procedure: Bilateral lumbar three-four, four-five decompressive laminotomy/foraminotomy;  Surgeon: Charlie Pitter, MD;  Location: [place] [facility] OR[facility];  Service: Neurosurgery;  Laterality: Bilateral;   PULSE GENERATOR IMPLANT Bilateral 12/13/2013   Procedure: Bilateral implantable pulse generator placement;  Surgeon: Erline Levine, MD;  Location: [place] [facility];  Service: Neurosurgery;  Laterality: Bilateral;  Bilateral implantable pulse generator placement   skin cancer removed     from ears,   12 lft arm  rt leg 15   SUBTHALAMIC STIMULATOR BATTERY REPLACEMENT Bilateral 07/14/2017   Procedure: BILATERAL IMPLANTED PULSE GENERATOR CHANGE FOR DEEP BRAIN STIMULATOR;  Surgeon: Erline Levine, MD;  Location: [place];  Service: Neurosurgery;  Laterality: Bilateral;   SUBTHALAMIC STIMULATOR INSERTION Bilateral 12/06/2013   Procedure: SUBTHALAMIC STIMULATOR INSERTION;  Surgeon: Erline Levine, MD;  Location: [place] [facility];  Service: Neurosurgery;  Laterality: Bilateral;  Bilateral deep brain stimulator placement    There were no vitals filed for this visit.   Subjective Assessment - 03/15/21 1056     Subjective Pt continues to reports no falls since last session.    Patient is accompained by: Family member    Pertinent History [name] is a 28yoM who comes to  [facility] for evaluation of conitnued difficulty of imbalance, unsteadiness on feet, and generally limited tolerance to mobility in the setting of chronic parkinsonism. SInce DC in February, pt has had PPM placement due to bradycardia issues. Pt also started using an UpWalker device 2 week prior to this evaluation, goal was to help with gradual melting of postural extension that occured with sustained upright actiivty. Pt has continued to practice supervised AMB with caregivers, short distances out of house and in community. Pt continues to experience frequent falls on a weekly basis,  at a rate that both he and wife report to be increasing in frequency. PMH: HTN, bradycardia s/p PPM, PD, DBS, COVID-19 infection, chronic low back pain s/p L4/5 surgery.    Limitations Walking;Standing;Sitting;House hold activities;Lifting    How long can you sit comfortably? Not limited    How long can you stand comfortably? 5 minutes with support    How long can you walk comfortably? ~5-6 minutes UpWalker Walker    Patient Stated Goals Reduce falls, improve AMB tolerance    Pain Onset More than a month ago    Pain Onset More than a month ago             Treatment provided this session  There ACT   Seated PWR! Up x 10, VC for proper posture   Seated Hip march 5lb BE 2 x 15 reps -seated on dynddisc and in armless chair to improve core muscle activation  Seated knee ext 5lb BLE 1 x10 reps ea side, seated on dynadisc to improve core muscle activation   Step 1 x 10 on ea side - lateral seated step  cues to keep upright posture and for adequate movement amplitude     STS with SBA, 2 x 10, cues for full ROM and full knee extension with each repetitions.   Row, seated with RTB 2 x 12 reps with 3 second hold. VC on technique.seated on dynadisc to improve core muscle activation.   Seated horizontal Shoulder abd RTB 2 x 10 reps seated on dynadisc to ipmrove core muscle activation    Standing there ACT:  The following activities were completed in parallel bars or at balance bar  -standing lateral step over 2 x 10 to ea LE  Seated hamstring stretch -2x 30 sec bilateral  Ambulation training with Up walker and metronome, 150 feet at 80 SPM and 170 feet at 86 SPM  -Improved gait speed with use of metronome and pt able to manage increased metronome speeds.    Pt encouraged throughout workout to maintain upright posture as pt tends to lean into trunk flexion.   Pt educated throughout session about proper posture and technique with exercises. Improved exercise technique, movement at  target joints, use of target muscles after min to mod verbal, visual, tactile cues.                            PT Education - 03/15/21 1403     Education provided Yes    Education Details UE movements and posture    Person(s) Educated Patient    Methods Explanation    Comprehension Verbalized understanding              PT Short Term Goals - 03/08/21 1125       PT SHORT TERM GOAL #1   Title Pt to demonstrate improved sustained AMB tolerance >844f.    Baseline At evlauation: 6063ftolerance (  not to failure); 9/26: 1384 ft    Time 4    Period Weeks    Status Achieved    Target Date 01/05/21      PT SHORT TERM GOAL #2   Title Pt to demonstrate MiniBesTestScore >17/28 to show improvement in balance.    Baseline Eval: 15/28; 9/26: deferred, 03/08/21: 15/28    Time 4    Period Weeks    Status On-going    Target Date 02/08/21      PT SHORT TERM GOAL #3   Title Pt to demonstrate 10MWT<13sec to show self-selected gait speed appropriate for AMB outside of house and to improve energy efficiency.    Baseline UpWalker Eval: 17.53sec; 9/26: 0.74 m/s (13.5)11/21: 13.68 (.73 m/s)    Time 4    Period Weeks    Status Partially Met    Target Date 02/08/21               PT Long Term Goals - 03/08/21 1127       PT LONG TERM GOAL #1   Title Pt to score MiniBesTest > 20/28 to show improved balance.    Baseline Eval: 15/28; 9/26: deferred 11/21: 15/28    Time 8    Period Weeks    Status On-going    Target Date 05/31/21      PT LONG TERM GOAL #2   Title Pt to tolerate 1000ft sustained AMB with UpWalker without rest interval to improve safety and independence in limited community AMB.    Baseline At eval; tired after 600ft; 9/26: 1384 ft    Time 8    Period Weeks    Status Achieved    Target Date 05/03/21      PT LONG TERM GOAL #3   Title Pt to demonstrate improved FOTO score by 5 points compared to initital assessment.    Baseline 9/26: 49 (eval  was 50) 911/21: 52    Time 8    Period Weeks    Status On-going    Target Date 05/03/21      PT LONG TERM GOAL #4   Title Pt to show tolerance of AMB>1300ft with UpWalker to improve tolerance to accessing the community for IADL.    Baseline Eval: 600ft;  9/26: 1385    Time 12    Period Weeks    Status Achieved    Target Date 05/31/21      PT LONG TERM GOAL #5   Title Patient will increase six minute walk test distance to >1000 for progression to community ambulator and improve gait ability    Baseline 9/26: new 11/21: 850 feet with up Walker    Time 8    Period Weeks    Status On-going    Target Date 05/03/21                   Plan - 03/15/21 1110     Clinical Impression Statement Pt tolerated treatment session well. Had improved efficacy with dynadisc exercises in today session compared to last session. Pt had some difficuty with maintaining cadence with ambulatory tasks in today's session despite metronome cuing. Pt continueing to benefit from balance, posture and ambulatory intervention to improve LE strength, postural control and prevent falls in the future.    Personal Factors and Comorbidities Age;Past/Current Experience;Time since onset of injury/illness/exacerbation;Transportation    Comorbidities Chronic back pain, orthostatic hypotension, repeated falls, parkinsonism    Examination-Activity Limitations Bathing;Bed Mobility;Caring for Others;Bend;Dressing;Lift;Locomotion Level;Reach Overhead;Sit;Squat;Stairs;Stand;Toileting;Transfers;Carry      Examination-Participation Restrictions Cleaning;Community Activity;Interpersonal Relationship;Shop;Volunteer;Yard Work;Other;Driving    Stability/Clinical Decision Making Evolving/Moderate complexity    Rehab Potential Fair    PT Frequency 2x / week    PT Duration 12 weeks    PT Treatment/Interventions ADLs/Self Care Home Management;Aquatic Therapy;Cryotherapy;Electrical Stimulation;Iontophoresis 1m/ml Dexamethasone;Moist  Heat;Ultrasound;Contrast Bath;DME Instruction;Gait training;Stair training;Functional mobility training;Therapeutic activities;Therapeutic exercise;Balance training;Neuromuscular re-education;Patient/family education;Manual techniques;Wheelchair mobility training;Energy conservation;Passive range of motion;Traction;Orthotic Fit/Training;Dry needling;Vestibular;Visual/perceptual remediation/compensation;Taping;Canalith Repostioning;Cognitive remediation    PT Next Visit Plan Advanced AMB tolerance, use MiniBesTest to guide balance interventions seated rotation and balance exercises    PT Home Exercise Plan None set up at evaluation, still working on previous HEP from 6 months prior (STS, daily walking); 8/29: issued STS with focus on large amplitude movement at support surface; 9/12: PT provides handout with sit to stand exercise and large amplitude seated trunk rotation exercise.  9/19: reinforced HEP, provided handout for seated postural exercise with UE abduction, and seated exercise with trunk twists and UE reach, 10x for each, can be performed daily or minimum of 4x/week. No changes today.    Consulted and Agree with Plan of Care Patient;Family member/caregiver    Family Member Consulted Wife             Patient will benefit from skilled therapeutic intervention in order to improve the following deficits and impairments:  Abnormal gait, Decreased balance, Decreased coordination, Decreased mobility, Postural dysfunction, Decreased strength, Decreased safety awareness, Improper body mechanics, Impaired flexibility, Decreased activity tolerance, Decreased endurance, Decreased knowledge of precautions, Difficulty walking, Pain, Cardiopulmonary status limiting activity, Decreased range of motion, Impaired perceived functional ability, Decreased cognition, Dizziness  Visit Diagnosis: Abnormality of gait and mobility  Difficulty in walking, not elsewhere classified  Unsteadiness on feet  Repeated  falls     Problem List Patient Active Problem List   Diagnosis Date Noted   Sinus drainage 12/09/2020   Medicare annual wellness visit, subsequent 11/18/2020   Pacemaker    D[place]'s disease [place] 06/17/2020   Vertigo 02/19/2020   Urine abnormality 02/19/2020   Dysphagia 02/23/2019   Fall at home 10/18/2017   REM behavioral disorder 11/02/2016   Radicular pain in right arm 10/09/2016   GERD (gastroesophageal reflux disease) 07/31/2016   Colon cancer screening 07/23/2015   Gout 02/18/2015   Depression 01/06/2015   Skin lesion 10/02/2014   Dupuytren's contracture 08/20/2014   Cough 07/21/2014   Lumbar stenosis with neurogenic claudication 06/13/2014   S/P deep brain stimulator placement 05/08/2014   Advance care planning 01/19/2014   [place]'s [place] 12/13/2013   PTSD (post-traumatic stress disorder) 06/13/2013   Erectile dysfunction 06/13/2013   HLD (hyperlipidemia) 06/13/2013   Essential hypertension 06/03/2013   Bradycardia by electrocardiogram 06/03/2013   Obstructive sleep apnea 03/12/2013    CParticia Lather PT 03/15/2021, 2:11 PM  [place] [facility] [facility] [address] [address]Phone: [phone]  Fax:  [phone] Name: George METZGARMRN: [id number] of Birth: 2Aug 07, 1952

## 2021-03-15 NOTE — Patient Instructions (Signed)
Begin follow a routine

## 2021-03-15 NOTE — Therapy (Signed)
[place] MAIN [facility] SERVICES [address] [place], [place], [address] Phone: [phone]   Fax:  [phone]  Speech Language Pathology Treatment  Patient Details  Name: [name] MRN: [id number] Date of Birth: 1950/12/11 Referring Provider (SLP): Elsie Stain   Encounter Date: 03/15/2021   End of Session - 03/15/21 1557     Visit Number 18    Number of Visits 27    Date for SLP Re-Evaluation 04/09/21    Authorization Type [employer] Choice PPO    Authorization Time Period 03/04/2021 thru 04/09/2021    Authorization - Visit Number 8    Progress Note Due on Visit 10    SLP Start Time 1000    SLP Stop Time  1100    SLP Time Calculation (min) 60 min    Activity Tolerance Patient tolerated treatment well             Past Medical History:  Diagnosis Date   Arthritis    Bradycardia    Cancer ([place]) 2013   skin cancer   Depression    ptsd   Dysrhythmia    chronic slow heart rate   GERD (gastroesophageal reflux disease)    Headache(784.0)    tension headaches non recent   History of chicken pox    History of kidney stones    passed   Hypertension    treated with HCTZ   Pacemaker    Parkinson's disease ([place])    dx'ed 15 years ago   PTSD (post-traumatic stress disorder)    Shortness of breath dyspnea    Sleep apnea    doesn't use C-pap   Varicose veins     Past Surgical History:  Procedure Laterality Date   CHOLECYSTECTOMY N/A 10/22/2014   Procedure: LAPAROSCOPIC CHOLECYSTECTOMY WITH INTRAOPERATIVE CHOLANGIOGRAM;  Surgeon: Dia Crawford III, MD;  Location: [facility] ORS;  Service: General;  Laterality: N/A;   cyst removed      from lip as a child   INTRAMEDULLARY (IM) NAIL INTERTROCHANTERIC N/A 02/18/2019   Procedure: INTRAMEDULLARY (IM) NAIL INTERTROCHANTRIC, RIGHT,;  Surgeon: Thornton Park, MD;  Location: [facility] ORS;  Service: Orthopedics;  Laterality: N/A;   LUMBAR LAMINECTOMY/DECOMPRESSION MICRODISCECTOMY  Bilateral 12/14/2012   Procedure: Bilateral lumbar three-four, four-five decompressive laminotomy/foraminotomy;  Surgeon: Charlie Pitter, MD;  Location: [place] NEURO ORS;  Service: Neurosurgery;  Laterality: Bilateral;   PULSE GENERATOR IMPLANT Bilateral 12/13/2013   Procedure: Bilateral implantable pulse generator placement;  Surgeon: Erline Levine, MD;  Location: [place] NEUR[facility] ORS;  Service: Neurosurgery;  Laterality: Bilateral;  Bilateral implantable pulse generator placement   skin cancer removed     from ears,   12 lft arm  rt leg 15   SUBTHALAMIC STIMULATOR BATTERY REPLACEMENT Bilateral 07/14/2017   Procedure: BILATERAL IMPLANTED PULSE GENERATOR CHANGE FOR DEEP BRAIN STIMULATOR;  Surgeon: Erline Levine, MD;  Location: [place];  Service: Neurosurgery;  Laterality: Bilateral;   SUBTHALAMIC STIMULATOR INSERTION Bilateral 12/06/2013   Procedure: SUBTHALAMIC STIMULATOR INSERTION;  Surgeon: Erline Levine, MD;  Location: [place] NEURO ORS;  Service: Neurosurgery;  Laterality: Bilateral;  Bilateral deep brain stimulator placement    There were no vitals filed for this visit.   Subjective Assessment - 03/15/21 1449     Subjective pt pleasant, accompanied by his wife    Patient is accompained by: Family member    Currently in Pain? No/denies                   ADULT SLP  TREATMENT - 03/15/21 0001       Treatment Provided   Treatment provided Cognitive-Linquistic      Cognitive-Linquistic Treatment   Treatment focused on Cognition;Dysarthria;Patient/family/caregiver education    Skilled Treatment COGNITIVE COMMUNICATION: Skilled treatment session focused pt's cognition deficits. SLP facilitated session by providng aiding pt and his wife in creating a daily routine with specific time slots and activities with short naps built in, list of activitis created to choose from during alloted "activity times," also provided community resources to help pt participate in activities outside of home environment               SLP Education - 03/15/21 1557     Education Details ways to create a routine or schedule, community resources    Person(s) Educated Patient;Spouse    Methods Explanation;Demonstration;Verbal cues;Handout    Comprehension Verbalized understanding;Returned demonstration              SLP Short Term Goals - 03/05/21 1557       SLP SHORT TERM GOAL #1   Title Pt will use a good quality loud voice to choice an activity from list in 8 out of 10 opportunities.    Baseline goal revised, 03/04/2021    Time 5    Period --   sessions   Status Revised      SLP SHORT TERM GOAL #2   Title Pt will use 1 external compensatory memory aid to recall location of schedule and refer to it.    Baseline goal revised, 03/04/2021    Time 5    Period --   sessions   Status Deferred      SLP SHORT TERM GOAL #3   Title Pt will sustain attention to basic task for 1 minute with moderate assistance.    Time 5    Period --   sessions   Status On-going              SLP Long Term Goals - 03/05/21 1600       SLP LONG TERM GOAL #1   Title Pt will use speech intelligibility strategies to achieve ~ 80% speech intelligiblity when selecting activities with intermittent supervision cues.    Baseline goal revised, 03/04/2021    Time 4    Period Weeks    Status Revised    Target Date 04/02/21      SLP LONG TERM GOAL #2   Title Patient will identify cognitive-communication barriers and participate in developing functional compensatory strategies.    Time 4    Period Weeks    Status Partially Met    Target Date 04/02/21              Plan - 03/15/21 1557     Clinical Impression Statement Instruction provided on ways to create routines at home. Pt and his wife voiced understanding. Will plan to complete this course of skilled ST intervention during next session.    Speech Therapy Frequency 2x / week    Duration 4 weeks    Treatment/Interventions Compensatory  techniques;Internal/external aids;Functional tasks;SLP instruction and feedback;Patient/family education    Potential to Achieve Goals Fair    Potential Considerations Ability to learn/carryover information;Medical prognosis;Severity of impairments;Co-morbidities;Previous level of function    SLP Home Exercise Plan provided, see pt instructions section    Consulted and Agree with Plan of Care Patient;Family member/caregiver    Family Member Consulted [name]             Patient  will benefit from skilled therapeutic intervention in order to improve the following deficits and impairments:   Cognitive communication deficit  Parkinson's disease ([place])  Dementia associated with Parkinson's disease [facility])    Problem List Patient Active Problem List   Diagnosis Date Noted   Sinus drainage 12/09/2020   Medicare annual wellness visit, subsequent 11/18/2020   Pacemaker    Dementia associated with Parkinson's disease ([place]) 06/17/2020   Vertigo 02/19/2020   Urine abnormality 02/19/2020   Dysphagia 02/23/2019   Fall at home 10/18/2017   REM behavioral disorder 11/02/2016   Radicular pain in right arm 10/09/2016   GERD (gastroesophageal reflux disease) 07/31/2016   Colon cancer screening 07/23/2015   Gout 02/18/2015   Depression 01/06/2015   Skin lesion 10/02/2014   Dupuytren's contracture 08/20/2014   Cough 07/21/2014   Lumbar stenosis with neurogenic claudication 06/13/2014   S/P deep brain stimulator placement 05/08/2014   Advance care planning 01/19/2014   Parkinson's disease ([place]) 12/13/2013   PTSD (post-traumatic stress disorder) 06/13/2013   Erectile dysfunction 06/13/2013   HLD (hyperlipidemia) 06/13/2013   Essential hypertension 06/03/2013   Bradycardia by electrocardiogram 06/03/2013   Obstructive sleep apnea 03/12/2013   [place] Floren B. Rutherford Nail M.S., CCC-SLP, [facility] Pathologist Rehabilitation Services Office [phone]  Stormy Fabian,  [place] 03/15/2021, 4:00 PM  [place] MAIN [facility] SERVICES [address] [place], [place], [address] Phone: [phone]   Fax:  [phone]   Name: FERD HORRIGAN MRN: [id number] Date of Birth: 11-Aug-1950

## 2021-03-17 ENCOUNTER — Other Ambulatory Visit: Payer: Self-pay

## 2021-03-17 ENCOUNTER — Ambulatory Visit: Payer: Medicare PPO | Admitting: Speech Pathology

## 2021-03-17 ENCOUNTER — Ambulatory Visit: Payer: Medicare PPO | Admitting: Physical Therapy

## 2021-03-17 DIAGNOSIS — R296 Repeated falls: Secondary | ICD-10-CM

## 2021-03-17 DIAGNOSIS — F0280 Dementia in other diseases classified elsewhere without behavioral disturbance: Secondary | ICD-10-CM

## 2021-03-17 DIAGNOSIS — R262 Difficulty in walking, not elsewhere classified: Secondary | ICD-10-CM | POA: Diagnosis not present

## 2021-03-17 DIAGNOSIS — G20 Parkinson's disease: Secondary | ICD-10-CM

## 2021-03-17 DIAGNOSIS — R269 Unspecified abnormalities of gait and mobility: Secondary | ICD-10-CM | POA: Diagnosis not present

## 2021-03-17 DIAGNOSIS — R41841 Cognitive communication deficit: Secondary | ICD-10-CM | POA: Diagnosis not present

## 2021-03-17 DIAGNOSIS — G20A1 Parkinson's disease without dyskinesia, without mention of fluctuations: Secondary | ICD-10-CM

## 2021-03-17 DIAGNOSIS — R2681 Unsteadiness on feet: Secondary | ICD-10-CM

## 2021-03-17 DIAGNOSIS — R2689 Other abnormalities of gait and mobility: Secondary | ICD-10-CM | POA: Diagnosis not present

## 2021-03-17 DIAGNOSIS — M6281 Muscle weakness (generalized): Secondary | ICD-10-CM | POA: Diagnosis not present

## 2021-03-17 DIAGNOSIS — R278 Other lack of coordination: Secondary | ICD-10-CM | POA: Diagnosis not present

## 2021-03-17 NOTE — Therapy (Signed)
[place] MAIN [facility] SERVICES [address] [place], [place], [address] Phone: [phone]   Fax:  [phone]  Physical Therapy Treatment  Patient Details  Name: [name] MRN: [id number] Date of Birth: 06-06-1950 Referring Provider (PT): Elsie Stain, MD   Encounter Date: 03/17/2021   PT End of Session - 03/17/21 1043     Visit Number 23    Number of Visits 48    Date for PT Re-Evaluation 05/31/21    Authorization Type [employer] Medicare Choice PPO    Authorization Time Period recert for 6/96-29/52    Progress Note Due on Visit 20    PT Start Time 1104    PT Stop Time 1147    PT Time Calculation (min) 43 min    Equipment Utilized During Treatment Gait belt    Activity Tolerance Patient tolerated treatment well    Behavior During Therapy WFL for tasks assessed/performed             Past Medical History:  Diagnosis Date   Arthritis    Bradycardia    Cancer ([place]) 2013   skin cancer   Depression    ptsd   Dysrhythmia    chronic slow heart rate   GERD (gastroesophageal reflux disease)    Headache(784.0)    tension headaches non recent   History of chicken pox    History of kidney stones    passed   Hypertension    treated with HCTZ   Pacemaker    Parkinson's disease ([place])    dx'ed 15 years ago   PTSD (post-traumatic stress disorder)    Shortness of breath dyspnea    Sleep apnea    doesn't use C-pap   Varicose veins     Past Surgical History:  Procedure Laterality Date   CHOLECYSTECTOMY N/A 10/22/2014   Procedure: LAPAROSCOPIC CHOLECYSTECTOMY WITH INTRAOPERATIVE CHOLANGIOGRAM;  Surgeon: Dia Crawford III, MD;  Location: [facility] ORS;  Service: General;  Laterality: N/A;   cyst removed      from lip as a child   INTRAMEDULLARY (IM) NAIL INTERTROCHANTERIC N/A 02/18/2019   Procedure: INTRAMEDULLARY (IM) NAIL INTERTROCHANTRIC, RIGHT,;  Surgeon: Thornton Park, MD;  Location: [facility] ORS;  Service: Orthopedics;  Laterality:  N/A;   LUMBAR LAMINECTOMY/DECOMPRESSION MICRODISCECTOMY Bilateral 12/14/2012   Procedure: Bilateral lumbar three-four, four-five decompressive laminotomy/foraminotomy;  Surgeon: Charlie Pitter, MD;  Location: [place] NEURO ORS;  Service: Neurosurgery;  Laterality: Bilateral;   PULSE GENERATOR IMPLANT Bilateral 12/13/2013   Procedure: Bilateral implantable pulse generator placement;  Surgeon: Erline Levine, MD;  Location: [place] NEURO ORS;  Service: Neurosurgery;  Laterality: Bilateral;  Bilateral implantable pulse generator placement   skin cancer removed     from ears,   12 lft arm  rt leg 15   SUBTHALAMIC STIMULATOR BATTERY REPLACEMENT Bilateral 07/14/2017   Procedure: BILATERAL IMPLANTED PULSE GENERATOR CHANGE FOR DEEP BRAIN STIMULATOR;  Surgeon: Erline Levine, MD;  Location: [facility];  Service: Neurosurgery;  Laterality: Bilateral;   SUBTHALAMIC STIMULATOR INSERTION Bilateral 12/06/2013   Procedure: SUBTHALAMIC STIMULATOR INSERTION;  Surgeon: Erline Levine, MD;  Location: [place] NEURO ORS;  Service: Neurosurgery;  Laterality: Bilateral;  Bilateral deep brain stimulator placement    There were no vitals filed for this visit.   Subjective Assessment - 03/17/21 1156     Subjective Pt continues to reports no falls since last session. No other significant changes. Reports some tiredenss this AM as him and his wife "slept in" by accident.    Patient  is accompained by: Family member    Pertinent History Jahaziel Francois is a 16yoM who comes [facility] neuro for evaluation of conitnued difficulty of imbalance, unsteadiness on feet, and generally limited tolerance to mobility in the setting of chronic parkinsonism. SInce DC in February, pt has had PPM placement due to bradycardia issues. Pt also started using an UpWalker device 2 week prior to this evaluation, goal was to help with gradual melting of postural extension that occured with sustained upright actiivty. Pt has continued to practice supervised AMB with caregivers,  short distances out of house and in community. Pt continues to experience frequent falls on a weekly basis, at a rate that both he and wife report to be increasing in frequency. PMH: HTN, bradycardia s/p PPM, PD, DBS, COVID-19 infection, chronic low back pain s/p L4/5 surgery.    Limitations Walking;Standing;Sitting;House hold activities;Lifting    How long can you sit comfortably? Not limited    How long can you stand comfortably? 5 minutes with support    How long can you walk comfortably? ~5-6 minutes UpWalker Walker    Patient Stated Goals Reduce falls, improve AMB tolerance    Pain Onset More than a month ago    Pain Onset More than a month ago               Treatment provided this session  Neuro Re-Ed  Seated PWR! Up x 10, VC for proper posture   Seated Hip march 5lb BE 2 x 15 reps -seated on dynddisc and in armless chair for core and postural muscle activation  Seated LAQ 5lb BLE 1 x10 reps ea side, seated on dynadisc to improve core muscle activation     STS with SBA, with mirror for visual feedback 1 x 10, cues for full ROM and full knee extension with each repetitions.  1 x 10 with hor shoulder ABD bilaterally to improve postural muscle activation  Row, seated with GTB 2 x 10 reps. VC on technique.seated on dynadisc to improve core muscle activation.   Seated horizontal Shoulder abd GTB 2 x 10 reps seated on dynadisc to ipmrove core muscle activation    Ladder drills walking with CGA and without up walker Mirror on both side for visual cues for upright posture (pt tends to lean to left with left steps.  -good step length, improved posture with mirrors for feedback.  X 6 laps   Ambulation training with Up walker and and cues for large step and increased gait speed, good step length noted and improved ability to walk within up walker supports but still putting significant weight through up walker supprts -x 170 feet  -Improved gait speed with use of metronome and pt  able to manage increased metronome speeds.    Pt encouraged throughout workout to maintain upright posture as pt tends to lean into trunk flexion.   Pt educated throughout session about proper posture and technique with exercises. Improved exercise technique, movement at target joints, use of target muscles after min to mod verbal, visual, tactile cues.                          PT Education - 03/17/21 1156     Education provided Yes    Education Details Posture with various activities and exercises    Person(s) Educated Patient    Methods Explanation    Comprehension Verbalized understanding;Returned demonstration  PT Short Term Goals - 03/08/21 1125       PT SHORT TERM GOAL #1   Title Pt to demonstrate improved sustained AMB tolerance >864ft.    Baseline At evlauation: 614ft tolerance (not to failure); 9/26: 1384 ft    Time 4    Period Weeks    Status Achieved    Target Date 01/05/21      PT SHORT TERM GOAL #2   Title Pt to demonstrate [place] >17/28 to show improvement in balance.    Baseline Eval: 15/28; 9/26: deferred, 03/08/21: 15/28    Time 4    Period Weeks    Status On-going    Target Date 02/08/21      PT SHORT TERM GOAL #3   Title Pt to demonstrate 10MWT<13sec to show self-selected gait speed appropriate for AMB outside of house and to improve energy efficiency.    Baseline UpWalker Eval: 17.53sec; 9/26: 0.74 m/s (13.5)11/21: 13.68 (.73 m/s)    Time 4    Period Weeks    Status Partially Met    Target Date 02/08/21               PT Long Term Goals - 03/08/21 1127       PT LONG TERM GOAL #1   Title Pt to score MiniBesTest > 20/28 to show improved balance.    Baseline Eval: 15/28; 9/26: deferred 11/21: 15/28    Time 8    Period Weeks    Status On-going    Target Date 05/31/21      PT LONG TERM GOAL #2   Title Pt to tolerate 1053ft sustained AMB with UpWalker without rest interval to improve safety  and independence in limited community AMB.    Baseline At eval; tired after 642ft; 9/26: 1384 ft    Time 8    Period Weeks    Status Achieved    Target Date 05/03/21      PT LONG TERM GOAL #3   Title Pt to demonstrate improved FOTO score by 5 points compared to initital assessment.    Baseline 9/26: 49 (eval was 50) 911/21: 52    Time 8    Period Weeks    Status On-going    Target Date 05/03/21      PT LONG TERM GOAL #4   Title Pt to show tolerance of AMB>1322ft with UpWalker to improve tolerance to accessing the community for IADL.    Baseline Eval: 670ft;  9/26: 1385    Time 12    Period Weeks    Status Achieved    Target Date 05/31/21      PT LONG TERM GOAL #5   Title Patient will increase six minute walk test distance to >1000 for progression to community ambulator and improve gait ability    Baseline 9/26: new 11/21: 850 feet with up Walker    Time 8    Period Weeks    Status On-going    Target Date 05/03/21                   Plan - 03/17/21 1043     Clinical Impression Statement Clinical Impression Statement  Pt tolerated treatment session well. Had improved efficacy with dynadisc exercises in today session compared to last session. Pt was able to demonstrate porgress with ambulatory tasks and balance as demonstrated by ability to ambuilate within ladder without UP walker (with CGA). Pt required visual cues for posture withthis exercsie but did not dmeonstrate  any LOB. Pt continueing to benefit from balance, posture and ambulatory intervention to improve LE strength, postural control and prevent falls in the future.    Personal Factors and Comorbidities Age;Past/Current Experience;Time since onset of injury/illness/exacerbation;Transportation    Comorbidities Chronic back pain, orthostatic hypotension, repeated falls, parkinsonism    Examination-Activity Limitations Bathing;Bed Mobility;Caring for Others;Bend;Dressing;Lift;Locomotion Level;Reach  Overhead;Sit;Squat;Stairs;Stand;Toileting;Transfers;Carry    Examination-Participation Restrictions Cleaning;Community Activity;Interpersonal Relationship;Shop;Volunteer;Yard Work;Other;Driving    Stability/Clinical Decision Making Evolving/Moderate complexity    Rehab Potential Fair    PT Frequency 2x / week    PT Duration 12 weeks    PT Treatment/Interventions ADLs/Self Care Home Management;Aquatic Therapy;Cryotherapy;Electrical Stimulation;Iontophoresis 4mg /ml Dexamethasone;Moist Heat;Ultrasound;Contrast Bath;DME Instruction;Gait training;Stair training;Functional mobility training;Therapeutic activities;Therapeutic exercise;Balance training;Neuromuscular re-education;Patient/family education;Manual techniques;Wheelchair mobility training;Energy conservation;Passive range of motion;Traction;Orthotic Fit/Training;Dry needling;Vestibular;Visual/perceptual remediation/compensation;Taping;Canalith Repostioning;Cognitive remediation    PT Next Visit Plan Advanced AMB tolerance, use MiniBesTest to guide balance interventions seated rotation and balance exercises    PT Home Exercise Plan None set up at evaluation, still working on previous HEP from 6 months prior (STS, daily walking); 8/29: issued STS with focus on large amplitude movement at support surface; 9/12: PT provides handout with sit to stand exercise and large amplitude seated trunk rotation exercise.  9/19: reinforced HEP, provided handout for seated postural exercise with UE abduction, and seated exercise with trunk twists and UE reach, 10x for each, can be performed daily or minimum of 4x/week. No changes today.    Consulted and Agree with Plan of Care Patient;Family member/caregiver    Family Member Consulted Wife             Patient will benefit from skilled therapeutic intervention in order to improve the following deficits and impairments:  Abnormal gait, Decreased balance, Decreased coordination, Decreased mobility, Postural  dysfunction, Decreased strength, Decreased safety awareness, Improper body mechanics, Impaired flexibility, Decreased activity tolerance, Decreased endurance, Decreased knowledge of precautions, Difficulty walking, Pain, Cardiopulmonary status limiting activity, Decreased range of motion, Impaired perceived functional ability, Decreased cognition, Dizziness  Visit Diagnosis: Abnormality of gait and mobility  Difficulty in walking, not elsewhere classified  Unsteadiness on feet  Repeated falls     Problem List Patient Active Problem List   Diagnosis Date Noted   Sinus drainage 12/09/2020   Medicare annual wellness visit, subsequent 11/18/2020   Pacemaker    Dementia associated with Parkinson's disease ([place]) 06/17/2020   Vertigo 02/19/2020   Urine abnormality 02/19/2020   Dysphagia 02/23/2019   Fall at home 10/18/2017   REM behavioral disorder 11/02/2016   Radicular pain in right arm 10/09/2016   GERD (gastroesophageal reflux disease) 07/31/2016   Colon cancer screening 07/23/2015   Gout 02/18/2015   Depression 01/06/2015   Skin lesion 10/02/2014   Dupuytren's contracture 08/20/2014   Cough 07/21/2014   Lumbar stenosis with neurogenic claudication 06/13/2014   S/P deep brain stimulator placement 05/08/2014   Advance care planning 01/19/2014   Parkinson's disease ([place]) 12/13/2013   PTSD (post-traumatic stress disorder) 06/13/2013   Erectile dysfunction 06/13/2013   HLD (hyperlipidemia) 06/13/2013   Essential hypertension 06/03/2013   Bradycardia by electrocardiogram 06/03/2013   Obstructive sleep apnea 03/12/2013    Particia Lather, PT 03/17/2021, 12:19 PM  [place] MAIN [facility] SERVICES [address] [place], [place], [address] Phone: [phone]   Fax:  [phone]  Name: CHAPIN ARDUINI MRN: [id number] Date of Birth: 1951/02/24

## 2021-03-17 NOTE — Therapy (Signed)
[place] MAIN [facility] SERVICES [address] [place], [place], [address] Phone: [phone]   Fax:  [phone]  Speech Language Pathology Treatment  DISCHARGE SUMMARY  Patient Details  Name: [name] MRN: [id number] Date of Birth: Oct 04, 1950 Referring Provider (SLP): Elsie Stain   Encounter Date: 03/17/2021   End of Session - 03/17/21 2247     Visit Number 19    Number of Visits 27    Date for SLP Re-Evaluation 04/09/21    Authorization Type [employer] Medicare Choice PPO    Authorization Time Period 03/04/2021 thru 04/09/2021    Authorization - Visit Number 9    Progress Note Due on Visit 10    SLP Start Time 1000    SLP Stop Time  1100    SLP Time Calculation (min) 60 min    Activity Tolerance Patient tolerated treatment well             Past Medical History:  Diagnosis Date   Arthritis    Bradycardia    Cancer ([place]) 2013   skin cancer   Depression    ptsd   Dysrhythmia    chronic slow heart rate   GERD (gastroesophageal reflux disease)    Headache(784.0)    tension headaches non recent   History of chicken pox    History of kidney stones    passed   Hypertension    treated with HCTZ   Pacemaker    Parkinson's disease ([place])    dx'ed 15 years ago   PTSD (post-traumatic stress disorder)    Shortness of breath dyspnea    Sleep apnea    doesn't use C-pap   Varicose veins     Past Surgical History:  Procedure Laterality Date   CHOLECYSTECTOMY N/A 10/22/2014   Procedure: LAPAROSCOPIC CHOLECYSTECTOMY WITH INTRAOPERATIVE CHOLANGIOGRAM;  Surgeon: Dia Crawford III, MD;  Location: [facility] ORS;  Service: General;  Laterality: N/A;   cyst removed      from lip as a child   INTRAMEDULLARY (IM) NAIL INTERTROCHANTERIC N/A 02/18/2019   Procedure: INTRAMEDULLARY (IM) NAIL INTERTROCHANTRIC, RIGHT,;  Surgeon: Thornton Park, MD;  Location: [facility] ORS;  Service: Orthopedics;  Laterality: N/A;   LUMBAR LAMINECTOMY/DECOMPRESSION  MICRODISCECTOMY Bilateral 12/14/2012   Procedure: Bilateral lumbar three-four, four-five decompressive laminotomy/foraminotomy;  Surgeon: Charlie Pitter, MD;  Location: [place] [facility]UR[facility] ORS;  Service: Neurosurgery;  Laterality: Bilateral;   PULSE GENERATOR IMPLANT Bilateral 12/13/2013   Procedure: Bilateral implantable pulse generator placement;  Surgeon: Erline Levine, MD;  Location: [place] NEURO ORS;  Service: Neurosurgery;  Laterality: Bilateral;  Bilateral implantable pulse generator placement   skin cancer removed     from ears,   12 lft arm  rt leg 15   SUBTHALAMIC STIMULATOR BATTERY REPLACEMENT Bilateral 07/14/2017   Procedure: BILATERAL IMPLANTED PULSE GENERATOR CHANGE FOR DEEP BRAIN STIMULATOR;  Surgeon: Erline Levine, MD;  Location: [place];  Service: Neurosurgery;  Laterality: Bilateral;   SUBTHALAMIC STIMULATOR INSERTION Bilateral 12/06/2013   Procedure: SUBTHALAMIC STIMULATOR INSERTION;  Surgeon: Erline Levine, MD;  Location: [place] NEURO ORS;  Service: Neurosurgery;  Laterality: Bilateral;  Bilateral deep brain stimulator placement    There were no vitals filed for this visit.   Subjective Assessment - 03/17/21 2244     Subjective pt with increased response times today, he and his wife report that it has been difficult morning    Patient is accompained by: Family member    Currently in Pain? No/denies  ADULT SLP TREATMENT - 03/17/21 0001       Treatment Provided   Treatment provided Cognitive-Linquistic      Cognitive-Linquistic Treatment   Treatment focused on Cognition;Dysarthria;Patient/family/caregiver education    Skilled Treatment COGNITIVE COMMUNICATION:Strategies of note taking while pt is reading devotional demonstrated with wife being able to return demonstration to help in pt's recall of information; pt's speech intelligiblity when reading initial portion of devotional was > 90% but deteriorated with no ability to improve despite maximal cues; pt's response  times were > 2 minutes despite assistance from communication partner              SLP Education - 03/17/21 2246     Education Details completed    Person(s) Educated Patient;Spouse    Methods Explanation    Comprehension Verbalized understanding;Returned demonstration              SLP Short Term Goals - 03/17/21 2249       SLP SHORT TERM GOAL #1   Title Pt will use a good quality loud voice to choose an activity from list in 8 out of 10 opportunities.    Status Not Met   pt unmotived to choose activities     SLP SHORT TERM GOAL #2   Title Pt will use 1 external compensatory memory aid to recall location of schedule and refer to it.    Status Not Met   pt unmotivated to follow routines     SLP SHORT TERM GOAL #3   Title Pt will sustain attention to basic task for 1 minute with moderate assistance.    Status Partially Met              SLP Long Term Goals - 03/17/21 2251       SLP LONG TERM GOAL #1   Title Pt will use speech intelligibility strategies to achieve ~ 80% speech intelligiblity when selecting activities with intermittent supervision cues.    Status Deferred   pt hesitant to choose activities     SLP LONG TERM GOAL #2   Title Patient will identify cognitive-communication barriers and participate in developing functional compensatory strategies.    Status Partially Met      SLP LONG TERM GOAL #3   Title Pt will complete cognitive communication HEP.    Status Partially Met   pt struggles with motivation             Plan - 03/17/21 2247     Clinical Impression Statement Pt and his wife have demonstrated compensatory startegies aimed at increasing pt's speech intelligibility and cognitive communication abilities within functional tasks. While pt is eager to learn compensatory strategies, he has not been able to effectively use them to increase functional independence. At this time, skilled ST intervention is no longer indicated d/t lack of  progress.    Potential to Achieve Goals Fair    Potential Considerations Ability to learn/carryover information;Medical prognosis;Severity of impairments;Co-morbidities;Previous level of function    Consulted and Agree with Plan of Care Patient;Family member/caregiver    Family Member Consulted [name]             Patient will benefit from skilled therapeutic intervention in order to improve the following deficits and impairments:   Cognitive communication deficit  Parkinson's disease ([place])  Dementia associated with Parkinson's disease ([place])    Problem List Patient Active Problem List   Diagnosis Date Noted   Sinus drainage 12/09/2020   Medicare annual wellness visit, subsequent  11/18/2020   Pacemaker    Dementia associated with Parkinson's disease ([place]) 06/17/2020   Vertigo 02/19/2020   Urine abnormality 02/19/2020   Dysphagia 02/23/2019   Fall at home 10/18/2017   REM behavioral disorder 11/02/2016   Radicular pain in right arm 10/09/2016   GERD (gastroesophageal reflux disease) 07/31/2016   Colon cancer screening 07/23/2015   Gout 02/18/2015   Depression 01/06/2015   Skin lesion 10/02/2014   Dupuytren's contracture 08/20/2014   Cough 07/21/2014   Lumbar stenosis with neurogenic claudication 06/13/2014   S/P deep brain stimulator placement 05/08/2014   Advance care planning 01/19/2014   Parkinson's disease ([place]) 12/13/2013   PTSD (post-traumatic stress disorder) 06/13/2013   Erectile dysfunction 06/13/2013   HLD (hyperlipidemia) 06/13/2013   Essential hypertension 06/03/2013   Bradycardia by electrocardiogram 06/03/2013   Obstructive sleep apnea 03/12/2013   Jeselle Hiser B. Rutherford Nail M.S., CCC-SLP, [facility] Speech-Language Pathologist Rehabilitation Services Office [phone]  Stormy Fabian, [place] 03/17/2021, 10:52 PM  [place] MAIN [facility] SERVICES [address] [place], [place], [address] Phone: [phone]   Fax:   [phone]   Name: [name] MRN: [id number] Date of Birth: 07-05-1950

## 2021-03-18 ENCOUNTER — Ambulatory Visit: Payer: Medicare PPO | Admitting: Psychology

## 2021-03-22 ENCOUNTER — Other Ambulatory Visit: Payer: Self-pay

## 2021-03-22 ENCOUNTER — Encounter: Payer: Medicare PPO | Admitting: Speech Pathology

## 2021-03-22 ENCOUNTER — Ambulatory Visit: Payer: Medicare PPO | Attending: Family Medicine | Admitting: Physical Therapy

## 2021-03-22 DIAGNOSIS — M6281 Muscle weakness (generalized): Secondary | ICD-10-CM | POA: Insufficient documentation

## 2021-03-22 DIAGNOSIS — G20 Parkinson's disease: Secondary | ICD-10-CM | POA: Insufficient documentation

## 2021-03-22 DIAGNOSIS — R2689 Other abnormalities of gait and mobility: Secondary | ICD-10-CM | POA: Insufficient documentation

## 2021-03-22 DIAGNOSIS — R296 Repeated falls: Secondary | ICD-10-CM | POA: Diagnosis not present

## 2021-03-22 DIAGNOSIS — R2681 Unsteadiness on feet: Secondary | ICD-10-CM | POA: Insufficient documentation

## 2021-03-22 DIAGNOSIS — R278 Other lack of coordination: Secondary | ICD-10-CM | POA: Insufficient documentation

## 2021-03-22 DIAGNOSIS — R269 Unspecified abnormalities of gait and mobility: Secondary | ICD-10-CM | POA: Insufficient documentation

## 2021-03-22 DIAGNOSIS — R262 Difficulty in walking, not elsewhere classified: Secondary | ICD-10-CM | POA: Insufficient documentation

## 2021-03-22 NOTE — Therapy (Signed)
[place] MAIN [facility] SERVICES [address] [place], [place], [address] Phone: [phone]   Fax:  [phone]  Physical Therapy Treatment  Patient Details  Name: [name] MRN: [id number] Date of Birth: 1950-09-26 Referring Provider (PT): Elsie Stain, MD   Encounter Date: 03/22/2021   PT End of Session - 03/22/21 1119     Visit Number 24    Number of Visits 48    Date for PT Re-Evaluation 05/31/21    Authorization Type [employer] Medicare Choice PPO    Authorization Time Period recert for 9/44-96/75    Progress Note Due on Visit 20    PT Start Time 1103    PT Stop Time 1145    PT Time Calculation (min) 42 min    Equipment Utilized During Treatment Gait belt    Activity Tolerance Patient tolerated treatment well    Behavior During Therapy WFL for tasks assessed/performed             Past Medical History:  Diagnosis Date   Arthritis    Bradycardia    Cancer ([place]) 2013   skin cancer   Depression    ptsd   Dysrhythmia    chronic slow heart rate   GERD (gastroesophageal reflux disease)    Headache(784.0)    tension headaches non recent   History of chicken pox    History of kidney stones    passed   Hypertension    treated with HCTZ   Pacemaker    Parkinson's disease ([place])    dx'ed 15 years ago   PTSD (post-traumatic stress disorder)    Shortness of breath dyspnea    Sleep apnea    doesn't use C-pap   Varicose veins     Past Surgical History:  Procedure Laterality Date   CHOLECYSTECTOMY N/A 10/22/2014   Procedure: LAPAROSCOPIC CHOLECYSTECTOMY WITH INTRAOPERATIVE CHOLANGIOGRAM;  Surgeon: Dia Crawford III, MD;  Location: [facility] ORS;  Service: General;  Laterality: N/A;   cyst removed      from lip as a child   INTRAMEDULLARY (IM) NAIL INTERTROCHANTERIC N/A 02/18/2019   Procedure: INTRAMEDULLARY (IM) NAIL INTERTROCHANTRIC, RIGHT,;  Surgeon: Thornton Park, MD;  Location: [facility] ORS;  Service: Orthopedics;  Laterality:  N/A;   LUMBAR LAMINECTOMY/DECOMPRESSION MICRODISCECTOMY Bilateral 12/14/2012   Procedure: Bilateral lumbar three-four, four-five decompressive laminotomy/foraminotomy;  Surgeon: Charlie Pitter, MD;  Location: [place] NEURO ORS;  Service: Neurosurgery;  Laterality: Bilateral;   PULSE GENERATOR IMPLANT Bilateral 12/13/2013   Procedure: Bilateral implantable pulse generator placement;  Surgeon: Erline Levine, MD;  Location: [place] NEURO ORS;  Service: Neurosurgery;  Laterality: Bilateral;  Bilateral implantable pulse generator placement   skin cancer removed     from ears,   12 lft arm  rt leg 15   SUBTHALAMIC STIMULATOR BATTERY REPLACEMENT Bilateral 07/14/2017   Procedure: BILATERAL IMPLANTED PULSE GENERATOR CHANGE FOR DEEP BRAIN STIMULATOR;  Surgeon: Erline Levine, MD;  Location: [place];  Service: Neurosurgery;  Laterality: Bilateral;   SUBTHALAMIC STIMULATOR INSERTION Bilateral 12/06/2013   Procedure: SUBTHALAMIC STIMULATOR INSERTION;  Surgeon: Erline Levine, MD;  Location: [place] NEURO ORS;  Service: Neurosurgery;  Laterality: Bilateral;  Bilateral deep brain stimulator placement    There were no vitals filed for this visit.   Subjective Assessment - 03/22/21 1108     Subjective Pt reports fall in the bathroom sinc last session. Pt wife described bathroom to have a lot of grab bars and is fairly open.    Patient is accompained by:  Family member    Pertinent History [name] is a 61yoM who [facility] for evaluation of conitnued difficulty of imbalance, unsteadiness on feet, and generally limited tolerance to mobility in the setting of chronic parkinsonism. SInce DC in February, pt has had PPM placement due to bradycardia issues. Pt also started using an UpWalker device 2 week prior to this evaluation, goal was to help with gradual melting of postural extension that occured with sustained upright actiivty. Pt has continued to practice supervised AMB with caregivers, short distances out of house and in  community. Pt continues to experience frequent falls on a weekly basis, at a rate that both he and wife report to be increasing in frequency. PMH: HTN, bradycardia s/p PPM, PD, DBS, COVID-19 infection, chronic low back pain s/p L4/5 surgery.    Limitations Walking;Standing;Sitting;House hold activities;Lifting    How long can you sit comfortably? Not limited    How long can you stand comfortably? 5 minutes with support    How long can you walk comfortably? ~5-6 minutes UpWalker Walker    Patient Stated Goals Reduce falls, improve AMB tolerance    Pain Onset More than a month ago    Pain Onset More than a month ago                Exercise/Activity Sets x Reps Resistance Assistance  Comments  Rows 2 x 12  GTB  - seated on dynadisc to improve posture and core activation   Hor ABD  2 x 12  GTB   seated on dynadisc to improve posture and core activation   LAQ 2 x 15  5# AW  seated on dynadisc to improve posture and core activation   marching 2 x 15 5# AW  seated on dynadisc to improve posture and core activation   Sidestepping  1 x 10   B UE anteriorly  -SBA - cues for large steps, good tolerance for large steps   sidestepping 1 x 6 to each side   No UE assist, CGA from PT  - smaller steps, no LOB noted   Ladder stepping forward  2 laps   Min A from PT -pain noted in right hip (reported soreness from fall yesterday)   Standing PWR! step X10 ea side  CGA -cues for coordinated UE and LE movements                                               PT Education - 03/22/21 1339     Education provided Yes    Education Details Educated regarding safe bathroom navigation    Person(s) Educated Patient;Spouse    Methods Explanation;Demonstration    Comprehension Verbalized understanding;Returned demonstration;Verbal cues required;Need further instruction;Tactile cues required              PT Short Term Goals - 03/08/21 1125       PT SHORT TERM GOAL #1   Title Pt  to demonstrate improved sustained AMB tolerance >836ft.    Baseline At evlauation: 675ft tolerance (not to failure); 9/26: 1384 ft    Time 4    Period Weeks    Status Achieved    Target Date 01/05/21      PT SHORT TERM GOAL #2   Title Pt to demonstrate [place] >17/28 to show improvement in balance.    Baseline  Eval: 15/28; 9/26: deferred, 03/08/21: 15/28    Time 4    Period Weeks    Status On-going    Target Date 02/08/21      PT SHORT TERM GOAL #3   Title Pt to demonstrate 10MWT<13sec to show self-selected gait speed appropriate for AMB outside of house and to improve energy efficiency.    Baseline UpWalker Eval: 17.53sec; 9/26: 0.74 m/s (13.5)11/21: 13.68 (.73 m/s)    Time 4    Period Weeks    Status Partially Met    Target Date 02/08/21               PT Long Term Goals - 03/08/21 1127       PT LONG TERM GOAL #1   Title Pt to score MiniBesTest > 20/28 to show improved balance.    Baseline Eval: 15/28; 9/26: deferred 11/21: 15/28    Time 8    Period Weeks    Status On-going    Target Date 05/31/21      PT LONG TERM GOAL #2   Title Pt to tolerate 1097ft sustained AMB with UpWalker without rest interval to improve safety and independence in limited community AMB.    Baseline At eval; tired after 665ft; 9/26: 1384 ft    Time 8    Period Weeks    Status Achieved    Target Date 05/03/21      PT LONG TERM GOAL #3   Title Pt to demonstrate improved FOTO score by 5 points compared to initital assessment.    Baseline 9/26: 49 (eval was 50) 911/21: 52    Time 8    Period Weeks    Status On-going    Target Date 05/03/21      PT LONG TERM GOAL #4   Title Pt to show tolerance of AMB>1357ft with UpWalker to improve tolerance to accessing the community for IADL.    Baseline Eval: 671ft;  9/26: 1385    Time 12    Period Weeks    Status Achieved    Target Date 05/31/21      PT LONG TERM GOAL #5   Title Patient will increase six minute walk test distance to  >1000 for progression to community ambulator and improve gait ability    Baseline 9/26: new 11/21: 850 feet with up Walker    Time 8    Period Weeks    Status On-going    Target Date 05/03/21                   Plan - 03/22/21 1120     Clinical Impression Statement Pt tolerated treatment session well. Had improved efficacy with dynadisc exercises in today session compared to last session. Pt was able to demonstrate porgress with ambulatory tasks and balance as demonstrated by ability to ambuilate within ladder without UP walker (with CGA). Pt required visual cues for posture wit hthis exercsie but did not demeonstrate any LOB. Pt continueing to benefit from balance, posture and ambulatory intervention to improve LE strength, postural control and prevent falls in the future.    Personal Factors and Comorbidities Age;Past/Current Experience;Time since onset of injury/illness/exacerbation;Transportation    Comorbidities Chronic back pain, orthostatic hypotension, repeated falls, parkinsonism    Examination-Activity Limitations Bathing;Bed Mobility;Caring for Others;Bend;Dressing;Lift;Locomotion Level;Reach Overhead;Sit;Squat;Stairs;Stand;Toileting;Transfers;Carry    Examination-Participation Restrictions Cleaning;Community Activity;Interpersonal Relationship;Shop;Volunteer;Yard Work;Other;Driving    Stability/Clinical Decision Making Evolving/Moderate complexity    Rehab Potential Fair    PT Frequency 2x / week  PT Duration 12 weeks    PT Treatment/Interventions ADLs/Self Care Home Management;Aquatic Therapy;Cryotherapy;Electrical Stimulation;Iontophoresis 4mg /ml Dexamethasone;Moist Heat;Ultrasound;Contrast Bath;DME Instruction;Gait training;Stair training;Functional mobility training;Therapeutic activities;Therapeutic exercise;Balance training;Neuromuscular re-education;Patient/family education;Manual techniques;Wheelchair mobility training;Energy conservation;Passive range of  motion;Traction;Orthotic Fit/Training;Dry needling;Vestibular;Visual/perceptual remediation/compensation;Taping;Canalith Repostioning;Cognitive remediation    PT Next Visit Plan Advanced AMB tolerance, use MiniBesTest to guide balance interventions seated rotation and balance exercises    PT Home Exercise Plan None set up at evaluation, still working on previous HEP from 6 months prior (STS, daily walking); 8/29: issued STS with focus on large amplitude movement at support surface; 9/12: PT provides handout with sit to stand exercise and large amplitude seated trunk rotation exercise.  9/19: reinforced HEP, provided handout for seated postural exercise with UE abduction, and seated exercise with trunk twists and UE reach, 10x for each, can be performed daily or minimum of 4x/week. No changes today.    Consulted and Agree with Plan of Care Patient;Family member/caregiver    Family Member Consulted Wife             Patient will benefit from skilled therapeutic intervention in order to improve the following deficits and impairments:  Abnormal gait, Decreased balance, Decreased coordination, Decreased mobility, Postural dysfunction, Decreased strength, Decreased safety awareness, Improper body mechanics, Impaired flexibility, Decreased activity tolerance, Decreased endurance, Decreased knowledge of precautions, Difficulty walking, Pain, Cardiopulmonary status limiting activity, Decreased range of motion, Impaired perceived functional ability, Decreased cognition, Dizziness  Visit Diagnosis: Abnormality of gait and mobility  Difficulty in walking, not elsewhere classified  Unsteadiness on feet  Repeated falls     Problem List Patient Active Problem List   Diagnosis Date Noted   Sinus drainage 12/09/2020   Medicare annual wellness visit, subsequent 11/18/2020   Pacemaker    Dementia associated with Parkinson's disease ([place]) 06/17/2020   Vertigo 02/19/2020   Urine abnormality 02/19/2020    Dysphagia 02/23/2019   Fall at home 10/18/2017   REM behavioral disorder 11/02/2016   Radicular pain in right arm 10/09/2016   GERD (gastroesophageal reflux disease) 07/31/2016   Colon cancer screening 07/23/2015   Gout 02/18/2015   Depression 01/06/2015   Skin lesion 10/02/2014   Dupuytren's contracture 08/20/2014   Cough 07/21/2014   Lumbar stenosis with neurogenic claudication 06/13/2014   S/P deep brain stimulator placement 05/08/2014   Advance care planning 01/19/2014   Parkinson's disease ([place]) 12/13/2013   PTSD (post-traumatic stress disorder) 06/13/2013   Erectile dysfunction 06/13/2013   HLD (hyperlipidemia) 06/13/2013   Essential hypertension 06/03/2013   Bradycardia by electrocardiogram 06/03/2013   Obstructive sleep apnea 03/12/2013    Particia Lather, PT 03/22/2021, 1:42 PM  [place] MAIN [facility] SERVICES [address] [place], [place], [address] Phone: [phone]   Fax:  [phone]  Name: MEARL OLVER MRN: [id number] Date of Birth: [birth year]

## 2021-03-23 ENCOUNTER — Ambulatory Visit (INDEPENDENT_AMBULATORY_CARE_PROVIDER_SITE_OTHER): Payer: Medicare PPO | Admitting: Psychology

## 2021-03-23 DIAGNOSIS — F331 Major depressive disorder, recurrent, moderate: Secondary | ICD-10-CM | POA: Diagnosis not present

## 2021-03-23 DIAGNOSIS — F4323 Adjustment disorder with mixed anxiety and depressed mood: Secondary | ICD-10-CM | POA: Diagnosis not present

## 2021-03-24 ENCOUNTER — Other Ambulatory Visit: Payer: Self-pay

## 2021-03-24 ENCOUNTER — Encounter: Payer: Self-pay | Admitting: Physical Therapy

## 2021-03-24 ENCOUNTER — Ambulatory Visit: Payer: Medicare PPO | Admitting: Physical Therapy

## 2021-03-24 ENCOUNTER — Encounter: Payer: Medicare PPO | Admitting: Speech Pathology

## 2021-03-24 DIAGNOSIS — R296 Repeated falls: Secondary | ICD-10-CM

## 2021-03-24 DIAGNOSIS — R262 Difficulty in walking, not elsewhere classified: Secondary | ICD-10-CM | POA: Diagnosis not present

## 2021-03-24 DIAGNOSIS — R2681 Unsteadiness on feet: Secondary | ICD-10-CM | POA: Diagnosis not present

## 2021-03-24 DIAGNOSIS — R278 Other lack of coordination: Secondary | ICD-10-CM | POA: Diagnosis not present

## 2021-03-24 DIAGNOSIS — R2689 Other abnormalities of gait and mobility: Secondary | ICD-10-CM | POA: Diagnosis not present

## 2021-03-24 DIAGNOSIS — R269 Unspecified abnormalities of gait and mobility: Secondary | ICD-10-CM | POA: Diagnosis not present

## 2021-03-24 DIAGNOSIS — M6281 Muscle weakness (generalized): Secondary | ICD-10-CM | POA: Diagnosis not present

## 2021-03-24 DIAGNOSIS — G20 Parkinson's disease: Secondary | ICD-10-CM | POA: Diagnosis not present

## 2021-03-24 NOTE — Therapy (Signed)
[place] MAIN [facility] SERVICES [address] [place], [place], [address] Phone: [phone]   Fax:  [phone]  Physical Therapy Treatment  Patient Details  Name: [name] MRN: [id number] Date of Birth: 10-07-1950 Referring Provider (PT): Elsie Stain, MD   Encounter Date: 03/24/2021   PT End of Session - 03/24/21 1050     Visit Number 25    Number of Visits 48    Date for PT Re-Evaluation 05/31/21    Authorization Type [employer] PPO    Authorization Time Period recert for 1/74-08/14    Progress Note Due on Visit 20    PT Start Time 1055    PT Stop Time 1140    PT Time Calculation (min) 45 min    Equipment Utilized During Treatment Gait belt    Activity Tolerance Patient tolerated treatment well    Behavior During Therapy [facility] for tasks assessed/performed             Past Medical History:  Diagnosis Date   Arthritis    Bradycardia    Cancer ([place]) 2013   skin cancer   Depression    ptsd   Dysrhythmia    chronic slow heart rate   GERD (gastroesophageal reflux disease)    Headache(784.0)    tension headaches non recent   History of chicken pox    History of kidney stones    passed   Hypertension    treated with HCTZ   Pacemaker    Parkinson's disease ([place])    dx'ed 15 years ago   PTSD (post-traumatic stress disorder)    Shortness of breath dyspnea    Sleep apnea    doesn't use C-pap   Varicose veins     Past Surgical History:  Procedure Laterality Date   CHOLECYSTECTOMY N/A 10/22/2014   Procedure: LAPAROSCOPIC CHOLECYSTECTOMY WITH INTRAOPERATIVE CHOLANGIOGRAM;  Surgeon: Dia Crawford III, MD;  Location: [facility] ORS;  Service: General;  Laterality: N/A;   cyst removed      from lip as a child   INTRAMEDULLARY (IM) NAIL INTERTROCHANTERIC N/A 02/18/2019   Procedure: INTRAMEDULLARY (IM) NAIL INTERTROCHANTRIC, RIGHT,;  Surgeon: Thornton Park, MD;  Location: [facility] ORS;  Service: Orthopedics;  Laterality:  N/A;   LUMBAR LAMINECTOMY/DECOMPRESSION MICRODISCECTOMY Bilateral 12/14/2012   Procedure: Bilateral lumbar three-four, four-five decompressive laminotomy/foraminotomy;  Surgeon: Charlie Pitter, MD;  Location: [place] [facility];  Service: Neurosurgery;  Laterality: Bilateral;   PULSE GENERATOR IMPLANT Bilateral 12/13/2013   Procedure: Bilateral implantable pulse generator placement;  Surgeon: Erline Levine, MD;  Location: [place] [facility];  Service: Neurosurgery;  Laterality: Bilateral;  Bilateral implantable pulse generator placement   skin cancer removed     from ears,   12 lft arm  rt leg 15   SUBTHALAMIC STIMULATOR BATTERY REPLACEMENT Bilateral 07/14/2017   Procedure: BILATERAL IMPLANTED PULSE GENERATOR CHANGE FOR DEEP BRAIN STIMULATOR;  Surgeon: Erline Levine, MD;  Location: [place];  Service: Neurosurgery;  Laterality: Bilateral;   SUBTHALAMIC STIMULATOR INSERTION Bilateral 12/06/2013   Procedure: SUBTHALAMIC STIMULATOR INSERTION;  Surgeon: Erline Levine, MD;  Location: [place] [facility];  Service: Neurosurgery;  Laterality: Bilateral;  Bilateral deep brain stimulator placement    There were no vitals filed for this visit.   Subjective Assessment - 03/24/21 1049     Subjective Pt reports no falls since last visis. Reports some soreness since the last visit as well as some difficulty with bed mobility.    Patient is accompained by: Family member  Pertinent History [name] is a 66yoM who [facility] neuro for evaluation of conitnued difficulty of imbalance, unsteadiness on feet, and generally limited tolerance to mobility in the setting of chronic parkinsonism. SInce DC in February, pt has had PPM placement due to bradycardia issues. Pt also started using an UpWalker device 2 week prior to this evaluation, goal was to help with gradual melting of postural extension that occured with sustained upright actiivty. Pt has continued to practice supervised AMB with caregivers, short distances out of house and  in community. Pt continues to experience frequent falls on a weekly basis, at a rate that both he and wife report to be increasing in frequency. PMH: HTN, bradycardia s/p PPM, PD, DBS, COVID-19 infection, chronic low back pain s/p L4/5 surgery.    Limitations Walking;Standing;Sitting;House hold activities;Lifting    How long can you sit comfortably? Not limited    How long can you stand comfortably? 5 minutes with support    How long can you walk comfortably? ~5-6 minutes UpWalker Walker    Patient Stated Goals Reduce falls, improve AMB tolerance    Pain Onset More than a month ago    Pain Onset More than a month ago                Exercise/Activity Sets x Reps x Resistance  Assistance  Charge tye  Comments  Rows 2 x 15 GTB   Neuro - seated on dynadisc to improve posture and core activation   Hor ABD  2 x 15 GTB  Neuro seated on dynadisc to improve posture and core activation   LAQ 2 x 15 -5#  Neuro seated on dynadisc to improve posture and core activation   marching 2 x 15- 5#  Neuro seated on dynadisc to improve posture and core activation                           Bed mobility training: bridges, side stepping, pulling with UE to move trunk X 8 min CGA, Min A for UE support when moving trunk  TherACT    Standing mobility: moving from Up walker to standing at balanc bar  X 4 idirectional CGA TherACT -cues for proper sequencing of task, improved with increased practice           Treatment Provided this session   Pt educated throughout session about proper posture and technique with exercises. Improved exercise technique, movement at target joints, use of target muscles after min to mod verbal, visual, tactile cues. Note: Portions of this document were prepared using Dragon voice recognition software and although reviewed may contain unintentional dictation errors in syntax, grammar, or spelling.                        PT Education - 03/24/21 1049      Education provided Yes    Education Details Exercise form and technique    Person(s) Educated Patient    Methods Explanation    Comprehension Verbalized understanding              PT Short Term Goals - 03/08/21 1125       PT SHORT TERM GOAL #1   Title Pt to demonstrate improved sustained AMB tolerance >846f.    Baseline At evlauation: 6048ftolerance (not to failure); 9/26: 1384 ft    Time 4    Period Weeks    Status Achieved  Target Date 01/05/21      PT SHORT TERM GOAL #2   Title Pt to demonstrate Kell >17/28 to show improvement in balance.    Baseline Eval: 15/28; 9/26: deferred, 03/08/21: 15/28    Time 4    Period Weeks    Status On-going    Target Date 02/08/21      PT SHORT TERM GOAL #3   Title Pt to demonstrate 10MWT<13sec to show self-selected gait speed appropriate for AMB outside of house and to improve energy efficiency.    Baseline UpWalker Eval: 17.53sec; 9/26: 0.74 m/s (13.5)11/21: 13.68 (.73 m/s)    Time 4    Period Weeks    Status Partially Met    Target Date 02/08/21               PT Long Term Goals - 03/08/21 1127       PT LONG TERM GOAL #1   Title Pt to score MiniBesTest > 20/28 to show improved balance.    Baseline Eval: 15/28; 9/26: deferred 11/21: 15/28    Time 8    Period Weeks    Status On-going    Target Date 05/31/21      PT LONG TERM GOAL #2   Title Pt to tolerate 1069f sustained AMB with UpWalker without rest interval to improve safety and independence in limited community AMB.    Baseline At eval; tired after 6016f 9/26: 1384 ft    Time 8    Period Weeks    Status Achieved    Target Date 05/03/21      PT LONG TERM GOAL #3   Title Pt to demonstrate improved FOTO score by 5 points compared to initital assessment.    Baseline 9/26: 49 (eval was 50) 911/21: 52    Time 8    Period Weeks    Status On-going    Target Date 05/03/21      PT LONG TERM GOAL #4   Title Pt to show tolerance of AMB>130028fith  UpWalker to improve tolerance to accessing the community for IADL.    Baseline Eval: 600f23f9/26: 1385    Time 12    Period Weeks    Status Achieved    Target Date 05/31/21      PT LONG TERM GOAL #5   Title Patient will increase six minute walk test distance to >1000 for progression to community ambulator and improve gait ability    Baseline 9/26: new 11/21: 850 feet with up WalkOakhavenTime 8    Period Weeks    Status On-going    Target Date 05/03/21                   Plan - 03/24/21 1050     Clinical Impression Statement Pt demonstrated good motivation for completion of program. Focussed on bed mobility tasks during treatmetn session in order to improve bed mobility at home. Pt also continues to work on safe transitions and has difficulty with sequencing of tasks and control of LEs. Pt continueing to benefit from balance, posture and ambulatory intervention to improve LE strength, postural control and prevent falls in the future.    Personal Factors and Comorbidities Age;Past/Current Experience;Time since onset of injury/illness/exacerbation;Transportation    Comorbidities Chronic back pain, orthostatic hypotension, repeated falls, parkinsonism    Examination-Activity Limitations Bathing;Bed Mobility;Caring for Others;Bend;Dressing;Lift;Locomotion Level;Reach Overhead;Sit;Squat;Stairs;Stand;Toileting;Transfers;Carry    Examination-Participation Restrictions Cleaning;Community Activity;Interpersonal Relationship;Shop;Volunteer;Yard Work;Other;Driving    Stability/Clinical Decision Making Evolving/Moderate complexity  Rehab Potential Fair    PT Frequency 2x / week    PT Duration 12 weeks    PT Treatment/Interventions ADLs/Self Care Home Management;Aquatic Therapy;Cryotherapy;Electrical Stimulation;Iontophoresis 55m/ml Dexamethasone;Moist Heat;Ultrasound;Contrast Bath;DME Instruction;Gait training;Stair training;Functional mobility training;Therapeutic activities;Therapeutic  exercise;Balance training;Neuromuscular re-education;Patient/family education;Manual techniques;Wheelchair mobility training;Energy conservation;Passive range of motion;Traction;Orthotic Fit/Training;Dry needling;Vestibular;Visual/perceptual remediation/compensation;Taping;Canalith Repostioning;Cognitive remediation    PT Next Visit Plan Advanced AMB tolerance, use MiniBesTest to guide balance interventions seated rotation and balance exercises    PT Home Exercise Plan None set up at evaluation, still working on previous HEP from 6 months prior (STS, daily walking); 8/29: issued STS with focus on large amplitude movement at support surface; 9/12: PT provides handout with sit to stand exercise and large amplitude seated trunk rotation exercise.  9/19: reinforced HEP, provided handout for seated postural exercise with UE abduction, and seated exercise with trunk twists and UE reach, 10x for each, can be performed daily or minimum of 4x/week. No changes today.    Consulted and Agree with Plan of Care Patient;Family member/caregiver    Family Member Consulted Wife             Patient will benefit from skilled therapeutic intervention in order to improve the following deficits and impairments:  Abnormal gait, Decreased balance, Decreased coordination, Decreased mobility, Postural dysfunction, Decreased strength, Decreased safety awareness, Improper body mechanics, Impaired flexibility, Decreased activity tolerance, Decreased endurance, Decreased knowledge of precautions, Difficulty walking, Pain, Cardiopulmonary status limiting activity, Decreased range of motion, Impaired perceived functional ability, Decreased cognition, Dizziness  Visit Diagnosis: Abnormality of gait and mobility  Difficulty in walking, not elsewhere classified  Unsteadiness on feet  Repeated falls     Problem List Patient Active Problem List   Diagnosis Date Noted   Sinus drainage 12/09/2020   Medicare annual wellness  visit, subsequent 11/18/2020   Pacemaker    Dementia associated with Parkinson's disease ([place] 06/17/2020   Vertigo 02/19/2020   Urine abnormality 02/19/2020   Dysphagia 02/23/2019   Fall at home 10/18/2017   REM behavioral disorder 11/02/2016   Radicular pain in right arm 10/09/2016   GERD (gastroesophageal reflux disease) 07/31/2016   Colon cancer screening 07/23/2015   Gout 02/18/2015   Depression 01/06/2015   Skin lesion 10/02/2014   Dupuytren's contracture 08/20/2014   Cough 07/21/2014   Lumbar stenosis with neurogenic claudication 06/13/2014   S/P deep brain stimulator placement 05/08/2014   Advance care planning 01/19/2014   [place]inson's disease ([place] 12/13/2013   PTSD (post-traumatic stress disorder) 06/13/2013   Erectile dysfunction 06/13/2013   HLD (hyperlipidemia) 06/13/2013   Essential hypertension 06/03/2013   Bradycardia by electrocardiogram 06/03/2013   Obstructive sleep apnea 03/12/2013    CParticia Lather PT 03/24/2021, 1:06 PM  [place] [facility] GroupSERV[facility] [address] [address]Phone: [phone]  Fax:  [phone] Name: George ALKIREMRN: [id number] of Birth: 215-Aug-1952

## 2021-03-26 ENCOUNTER — Encounter: Payer: Self-pay | Admitting: Family Medicine

## 2021-03-28 ENCOUNTER — Encounter: Payer: Self-pay | Admitting: Family Medicine

## 2021-03-29 ENCOUNTER — Ambulatory Visit: Payer: Medicare PPO | Admitting: Physical Therapy

## 2021-03-29 ENCOUNTER — Encounter: Payer: Medicare PPO | Admitting: Speech Pathology

## 2021-03-29 ENCOUNTER — Other Ambulatory Visit: Payer: Self-pay

## 2021-03-29 DIAGNOSIS — R296 Repeated falls: Secondary | ICD-10-CM | POA: Diagnosis not present

## 2021-03-29 DIAGNOSIS — R269 Unspecified abnormalities of gait and mobility: Secondary | ICD-10-CM

## 2021-03-29 DIAGNOSIS — R262 Difficulty in walking, not elsewhere classified: Secondary | ICD-10-CM | POA: Diagnosis not present

## 2021-03-29 DIAGNOSIS — R2681 Unsteadiness on feet: Secondary | ICD-10-CM

## 2021-03-29 DIAGNOSIS — M6281 Muscle weakness (generalized): Secondary | ICD-10-CM | POA: Diagnosis not present

## 2021-03-29 DIAGNOSIS — R2689 Other abnormalities of gait and mobility: Secondary | ICD-10-CM | POA: Diagnosis not present

## 2021-03-29 DIAGNOSIS — R278 Other lack of coordination: Secondary | ICD-10-CM | POA: Diagnosis not present

## 2021-03-29 DIAGNOSIS — G20 Parkinson's disease: Secondary | ICD-10-CM | POA: Diagnosis not present

## 2021-03-29 NOTE — Therapy (Signed)
[place] MAIN [facility] SERVICES [address] [place], [place], [address] Phone: [phone]   Fax:  [phone]  Physical Therapy Treatment  Patient Details  Name: [name] MRN: [id number] Date of Birth: June 24, 1950 Referring Provider (PT): Elsie Stain, MD   Encounter Date: 03/29/2021   PT End of Session - 03/29/21 1248     Visit Number 26    Number of Visits 48    Date for PT Re-Evaluation 05/31/21    Authorization Type [employer] Choice PPO    Authorization Time Period recert for 6/44-03/47    Progress Note Due on Visit 30    PT Start Time 1019    PT Stop Time 1059    PT Time Calculation (min) 40 min    Equipment Utilized During Treatment Gait belt    Activity Tolerance Patient tolerated treatment well    Behavior During Therapy WFL for tasks assessed/performed             Past Medical History:  Diagnosis Date   Arthritis    Bradycardia    Cancer ([place]) 2013   skin cancer   Depression    ptsd   Dysrhythmia    chronic slow heart rate   GERD (gastroesophageal reflux disease)    Headache(784.0)    tension headaches non recent   History of chicken pox    History of kidney stones    passed   Hypertension    treated with HCTZ   Pacemaker    Parkinson's disease ([place])    dx'ed 15 years ago   PTSD (post-traumatic stress disorder)    Shortness of breath dyspnea    Sleep apnea    doesn't use C-pap   Varicose veins     Past Surgical History:  Procedure Laterality Date   CHOLECYSTECTOMY N/A 10/22/2014   Procedure: LAPAROSCOPIC CHOLECYSTECTOMY WITH INTRAOPERATIVE CHOLANGIOGRAM;  Surgeon: Dia Crawford III, MD;  Location: [facility] ORS;  Service: General;  Laterality: N/A;   cyst removed      from lip as a child   INTRAMEDULLARY (IM) NAIL INTERTROCHANTERIC N/A 02/18/2019   Procedure: INTRAMEDULLARY (IM) NAIL INTERTROCHANTRIC, RIGHT,;  Surgeon: Thornton Park, MD;  Location: [facility] ORS;  Service: Orthopedics;  Laterality:  N/A;   LUMBAR LAMINECTOMY/DECOMPRESSION MICRODISCECTOMY Bilateral 12/14/2012   Procedure: Bilateral lumbar three-four, four-five decompressive laminotomy/foraminotomy;  Surgeon: Charlie Pitter, MD;  Location: [place] [facility]UR[facility] ORS;  Service: Neurosurgery;  Laterality: Bilateral;   PULSE GENERATOR IMPLANT Bilateral 12/13/2013   Procedure: Bilateral implantable pulse generator placement;  Surgeon: Erline Levine, MD;  Location: [place] [facility] ORS;  Service: Neurosurgery;  Laterality: Bilateral;  Bilateral implantable pulse generator placement   skin cancer removed     from ears,   12 lft arm  rt leg 15   SUBTHALAMIC STIMULATOR BATTERY REPLACEMENT Bilateral 07/14/2017   Procedure: BILATERAL IMPLANTED PULSE GENERATOR CHANGE FOR DEEP BRAIN STIMULATOR;  Surgeon: Erline Levine, MD;  Location: [place];  Service: Neurosurgery;  Laterality: Bilateral;   SUBTHALAMIC STIMULATOR INSERTION Bilateral 12/06/2013   Procedure: SUBTHALAMIC STIMULATOR INSERTION;  Surgeon: Erline Levine, MD;  Location: [place] [facility]UR[facility] ORS;  Service: Neurosurgery;  Laterality: Bilateral;  Bilateral deep brain stimulator placement    There were no vitals filed for this visit.   Subjective Assessment - 03/29/21 1052     Subjective Pt reports no falls since last visis. Pt reports improvement in bned mobility since last visit and improved soreness in the since last visit.    Patient is accompained by:  Family member    Pertinent History [name] is a 77yoM who comes [facility] for evaluation of conitnued difficulty of imbalance, unsteadiness on feet, and generally limited tolerance to mobility in the setting of chronic parkinsonism. SInce DC in February, pt has had PPM placement due to bradycardia issues. Pt also started using an UpWalker device 2 week prior to this evaluation, goal was to help with gradual melting of postural extension that occured with sustained upright actiivty. Pt has continued to practice supervised AMB with caregivers, short distances out  of house and in community. Pt continues to experience frequent falls on a weekly basis, at a rate that both he and wife report to be increasing in frequency. PMH: HTN, bradycardia s/p PPM, PD, DBS, COVID-19 infection, chronic low back pain s/p L4/5 surgery.    Limitations Walking;Standing;Sitting;House hold activities;Lifting    How long can you sit comfortably? Not limited    How long can you stand comfortably? 5 minutes with support    How long can you walk comfortably? ~5-6 minutes UpWalker Walker    Patient Stated Goals Reduce falls, improve AMB tolerance    Pain Onset More than a month ago    Pain Onset More than a month ago             Exercise/Activity Sets x Reps x Resistance  Assistance  Charge tye  Comments  Rows 2 x 15 BlackTB   Neuro - seated on dynadisc to improve posture and core activation    Hor ABD  2 x 15 GTB  Neuro seated on dynadisc to improve posture and core activation  -cues to prevent LE on arm rest to further engage core muscle activation.   LAQ 2 x 15 -5#  Neuro seated on dynadisc to improve posture and core activation   marching 2 x 15- 5#  Neuro seated on dynadisc to improve posture and core activation   STS X 10   therACT Cues for forward weight shift   PWR! Step standing  2 x 5  therACt To improve transition from walker to and from standing at counter support -cues for UE and LE movement, without cues pt tends to take smaller step.   Seated PWR! Step  2 x 10   TherACT               Standing mobility: moving from Up walker to standing at balanc bar  X 2 bidirectional CGA TherACT -cues for proper sequencing of task, improved with increased practice   Ambulation with UP walker X 150 feet CGA TherACT - Cues for step length and cadence a swell as maintaining correct posture in Up Walker     Treatment Provided this session   Pt educated throughout session about proper posture and technique with exercises. Improved exercise technique, movement at target  joints, use of target muscles after min to mod verbal, visual, tactile cues. Note: Portions of this document were prepared using Dragon voice recognition software and although reviewed may contain unintentional dictation errors in syntax, grammar, or spelling.                                PT Short Term Goals - 03/08/21 1125       PT SHORT TERM GOAL #1   Title Pt to demonstrate improved sustained AMB tolerance >873ft.    Baseline At evlauation: 639ft tolerance (not to failure); 9/26: 6629 ft  Time 4    Period Weeks    Status Achieved    Target Date 01/05/21      PT SHORT TERM GOAL #2   Title Pt to demonstrate [place] >17/28 to show improvement in balance.    Baseline Eval: 15/28; 9/26: deferred, 03/08/21: 15/28    Time 4    Period Weeks    Status On-going    Target Date 02/08/21      PT SHORT TERM GOAL #3   Title Pt to demonstrate 10MWT<13sec to show self-selected gait speed appropriate for AMB outside of house and to improve energy efficiency.    Baseline UpWalker Eval: 17.53sec; 9/26: 0.74 m/s (13.5)11/21: 13.68 (.73 m/s)    Time 4    Period Weeks    Status Partially Met    Target Date 02/08/21               PT Long Term Goals - 03/08/21 1127       PT LONG TERM GOAL #1   Title Pt to score MiniBesTest > 20/28 to show improved balance.    Baseline Eval: 15/28; 9/26: deferred 11/21: 15/28    Time 8    Period Weeks    Status On-going    Target Date 05/31/21      PT LONG TERM GOAL #2   Title Pt to tolerate 1039ft sustained AMB with UpWalker without rest interval to improve safety and independence in limited community AMB.    Baseline At eval; tired after 615ft; 9/26: 1384 ft    Time 8    Period Weeks    Status Achieved    Target Date 05/03/21      PT LONG TERM GOAL #3   Title Pt to demonstrate improved FOTO score by 5 points compared to initital assessment.    Baseline 9/26: 49 (eval was 50) 911/21: 52    Time 8     Period Weeks    Status On-going    Target Date 05/03/21      PT LONG TERM GOAL #4   Title Pt to show tolerance of AMB>1333ft with UpWalker to improve tolerance to accessing the community for IADL.    Baseline Eval: 657ft;  9/26: 1385    Time 12    Period Weeks    Status Achieved    Target Date 05/31/21      PT LONG TERM GOAL #5   Title Patient will increase six minute walk test distance to >1000 for progression to community ambulator and improve gait ability    Baseline 9/26: new 11/21: 850 feet with up Walker    Time 8    Period Weeks    Status On-going    Target Date 05/03/21                   Plan - 03/29/21 1249     Clinical Impression Statement Pt continues to deomnstrate great motivation for completion of PT program. Pt continued with seated postural strengthening exercises thias session and shows improved seated postural strength and seated stability. Increased resistance utilizing black thera band for rows today and pt tolerated well. Pt also continued with transition training from up walker to supportive surface and pt demonstrated improved sequencing with this task. Pt will continue to benefit from skilled PT intervention in order to improve his fall risk, safety with everyday tasks, posture and overall mobility.    Personal Factors and Comorbidities Age;Past/Current Experience;Time since onset of injury/illness/exacerbation;Transportation    Comorbidities Chronic  back pain, orthostatic hypotension, repeated falls, parkinsonism    Examination-Activity Limitations Bathing;Bed Mobility;Caring for Others;Bend;Dressing;Lift;Locomotion Level;Reach Overhead;Sit;Squat;Stairs;Stand;Toileting;Transfers;Carry    Examination-Participation Restrictions Cleaning;Community Activity;Interpersonal Relationship;Shop;Volunteer;Yard Work;Other;Driving    Stability/Clinical Decision Making Evolving/Moderate complexity    Rehab Potential Fair    PT Frequency 2x / week    PT Duration 12  weeks    PT Treatment/Interventions ADLs/Self Care Home Management;Aquatic Therapy;Cryotherapy;Electrical Stimulation;Iontophoresis 4mg /ml Dexamethasone;Moist Heat;Ultrasound;Contrast Bath;DME Instruction;Gait training;Stair training;Functional mobility training;Therapeutic activities;Therapeutic exercise;Balance training;Neuromuscular re-education;Patient/family education;Manual techniques;Wheelchair mobility training;Energy conservation;Passive range of motion;Traction;Orthotic Fit/Training;Dry needling;Vestibular;Visual/perceptual remediation/compensation;Taping;Canalith Repostioning;Cognitive remediation    PT Next Visit Plan Advanced AMB tolerance, use MiniBesTest to guide balance interventions seated rotation and balance exercises    PT Home Exercise Plan None set up at evaluation, still working on previous HEP from 6 months prior (STS, daily walking); 8/29: issued STS with focus on large amplitude movement at support surface; 9/12: PT provides handout with sit to stand exercise and large amplitude seated trunk rotation exercise.  9/19: reinforced HEP, provided handout for seated postural exercise with UE abduction, and seated exercise with trunk twists and UE reach, 10x for each, can be performed daily or minimum of 4x/week. No changes today.    Consulted and Agree with Plan of Care Patient;Family member/caregiver    Family Member Consulted Wife             Patient will benefit from skilled therapeutic intervention in order to improve the following deficits and impairments:  Abnormal gait, Decreased balance, Decreased coordination, Decreased mobility, Postural dysfunction, Decreased strength, Decreased safety awareness, Improper body mechanics, Impaired flexibility, Decreased activity tolerance, Decreased endurance, Decreased knowledge of precautions, Difficulty walking, Pain, Cardiopulmonary status limiting activity, Decreased range of motion, Impaired perceived functional ability, Decreased  cognition, Dizziness  Visit Diagnosis: Abnormality of gait and mobility  Difficulty in walking, not elsewhere classified  Unsteadiness on feet  Repeated falls     Problem List Patient Active Problem List   Diagnosis Date Noted   Sinus drainage 12/09/2020   Medicare annual wellness visit, subsequent 11/18/2020   Pacemaker    Dementia associated with Parkinson's disease ([place]) 06/17/2020   Vertigo 02/19/2020   Urine abnormality 02/19/2020   Dysphagia 02/23/2019   Fall at home 10/18/2017   REM behavioral disorder 11/02/2016   Radicular pain in right arm 10/09/2016   GERD (gastroesophageal reflux disease) 07/31/2016   Colon cancer screening 07/23/2015   Gout 02/18/2015   Depression 01/06/2015   Skin lesion 10/02/2014   Dupuytren's contracture 08/20/2014   Cough 07/21/2014   Lumbar stenosis with neurogenic claudication 06/13/2014   S/P deep brain stimulator placement 05/08/2014   Advance care planning 01/19/2014   Parkinson's disease ([place]) 12/13/2013   PTSD (post-traumatic stress disorder) 06/13/2013   Erectile dysfunction 06/13/2013   HLD (hyperlipidemia) 06/13/2013   Essential hypertension 06/03/2013   Bradycardia by electrocardiogram 06/03/2013   Obstructive sleep apnea 03/12/2013    Particia Lather, PT 03/29/2021, 1:22 PM  [place] MAIN [facility] SERVICES [address] [place], [place], [address] Phone: [phone]   Fax:  [phone]  Name: FREEMAN BORBA MRN: [id number] Date of Birth: 02/06/1951

## 2021-03-31 ENCOUNTER — Other Ambulatory Visit: Payer: Self-pay

## 2021-03-31 ENCOUNTER — Ambulatory Visit: Payer: Medicare PPO | Admitting: Physical Therapy

## 2021-03-31 ENCOUNTER — Encounter: Payer: Medicare PPO | Admitting: Speech Pathology

## 2021-03-31 DIAGNOSIS — R296 Repeated falls: Secondary | ICD-10-CM

## 2021-03-31 DIAGNOSIS — M6281 Muscle weakness (generalized): Secondary | ICD-10-CM | POA: Diagnosis not present

## 2021-03-31 DIAGNOSIS — R2689 Other abnormalities of gait and mobility: Secondary | ICD-10-CM | POA: Diagnosis not present

## 2021-03-31 DIAGNOSIS — R278 Other lack of coordination: Secondary | ICD-10-CM | POA: Diagnosis not present

## 2021-03-31 DIAGNOSIS — R262 Difficulty in walking, not elsewhere classified: Secondary | ICD-10-CM | POA: Diagnosis not present

## 2021-03-31 DIAGNOSIS — R269 Unspecified abnormalities of gait and mobility: Secondary | ICD-10-CM | POA: Diagnosis not present

## 2021-03-31 DIAGNOSIS — R2681 Unsteadiness on feet: Secondary | ICD-10-CM | POA: Diagnosis not present

## 2021-03-31 DIAGNOSIS — G20 Parkinson's disease: Secondary | ICD-10-CM | POA: Diagnosis not present

## 2021-03-31 NOTE — Therapy (Signed)
[place] MAIN [facility] SERVICES [address] [place], [place], [address] Phone: [phone]   Fax:  [phone]  Physical Therapy Treatment  Patient Details  Name: [name] MRN: [id number] Date of Birth: Apr 18, 1951 Referring Provider (PT): Elsie Stain, MD   Encounter Date: 03/31/2021   PT End of Session - 03/31/21 1116     Visit Number 27    Number of Visits 48    Date for PT Re-Evaluation 05/31/21    Authorization Type [employer] Choice PPO    Authorization Time Period recert for 5/70-17/79    Progress Note Due on Visit 30    PT Start Time 1103    PT Stop Time 1145    PT Time Calculation (min) 42 min    Equipment Utilized During Treatment Gait belt    Activity Tolerance Patient tolerated treatment well    Behavior During Therapy WFL for tasks assessed/performed             Past Medical History:  Diagnosis Date   Arthritis    Bradycardia    Cancer ([place]) 2013   skin cancer   Depression    ptsd   Dysrhythmia    chronic slow heart rate   GERD (gastroesophageal reflux disease)    Headache(784.0)    tension headaches non recent   History of chicken pox    History of kidney stones    passed   Hypertension    treated with HCTZ   Pacemaker    Parkinson's disease ([place])    dx'ed 15 years ago   PTSD (post-traumatic stress disorder)    Shortness of breath dyspnea    Sleep apnea    doesn't use C-pap   Varicose veins     Past Surgical History:  Procedure Laterality Date   CHOLECYSTECTOMY N/A 10/22/2014   Procedure: LAPAROSCOPIC CHOLECYSTECTOMY WITH INTRAOPERATIVE CHOLANGIOGRAM;  Surgeon: Dia Crawford III, MD;  Location: [facility] ORS;  Service: General;  Laterality: N/A;   cyst removed      from lip as a child   INTRAMEDULLARY (IM) NAIL INTERTROCHANTERIC N/A 02/18/2019   Procedure: INTRAMEDULLARY (IM) NAIL INTERTROCHANTRIC, RIGHT,;  Surgeon: Thornton Park, MD;  Location: [facility] ORS;  Service: Orthopedics;  Laterality:  N/A;   LUMBAR LAMINECTOMY/DECOMPRESSION MICRODISCECTOMY Bilateral 12/14/2012   Procedure: Bilateral lumbar three-four, four-five decompressive laminotomy/foraminotomy;  Surgeon: Charlie Pitter, MD;  Location: [place] [facility]URO ORS;  Service: Neurosurgery;  Laterality: Bilateral;   PULSE GENERATOR IMPLANT Bilateral 12/13/2013   Procedure: Bilateral implantable pulse generator placement;  Surgeon: Erline Levine, MD;  Location: [place] [facility]UR[facility] ORS;  Service: Neurosurgery;  Laterality: Bilateral;  Bilateral implantable pulse generator placement   skin cancer removed     from ears,   12 lft arm  rt leg 15   SUBTHALAMIC STIMULATOR BATTERY REPLACEMENT Bilateral 07/14/2017   Procedure: BILATERAL IMPLANTED PULSE GENERATOR CHANGE FOR DEEP BRAIN STIMULATOR;  Surgeon: Erline Levine, MD;  Location: [place];  Service: Neurosurgery;  Laterality: Bilateral;   SUBTHALAMIC STIMULATOR INSERTION Bilateral 12/06/2013   Procedure: SUBTHALAMIC STIMULATOR INSERTION;  Surgeon: Erline Levine, MD;  Location: [place] NEURO ORS;  Service: Neurosurgery;  Laterality: Bilateral;  Bilateral deep brain stimulator placement    There were no vitals filed for this visit.   Subjective Assessment - 03/31/21 1338     Subjective Pt reports no falls since last visits. Pt reports continued improvement in bed mobility since previous session but some continued difficulty with transitions in bathroom but no falls or LOB.  Patient is accompained by: Family member    Pertinent History [name] is a 44yoM who [facility] neuro for evaluation of conitnued difficulty of imbalance, unsteadiness on feet, and generally limited tolerance to mobility in the setting of chronic parkinsonism. SInce DC in February, pt has had PPM placement due to bradycardia issues. Pt also started using an UpWalker device 2 week prior to this evaluation, goal was to help with gradual melting of postural extension that occured with sustained upright actiivty. Pt has continued to practice  supervised AMB with caregivers, short distances out of house and in community. Pt continues to experience frequent falls on a weekly basis, at a rate that both he and wife report to be increasing in frequency. PMH: HTN, bradycardia s/p PPM, PD, DBS, COVID-19 infection, chronic low back pain s/p L4/5 surgery.    Limitations Walking;Standing;Sitting;House hold activities;Lifting    How long can you sit comfortably? Not limited    How long can you stand comfortably? 5 minutes with support    How long can you walk comfortably? ~5-6 minutes UpWalker Walker    Patient Stated Goals Reduce falls, improve AMB tolerance    Currently in Pain? No/denies    Pain Onset More than a month ago    Pain Onset More than a month ago               Exercise/Activity Sets x Reps x Resistance  Assistance  Charge tye  Comments  Rows 2 x 15 BlackTB   Neuro - seated on dynadisc to improve posture and core activation    Hor ABD  2 x 15 GTB  Neuro seated on dynadisc to improve posture and core activation  -cues to prevent LE on arm rest to further engage core muscle activation.   LAQ 2 x 15 -5#  Neuro seated on dynadisc to improve posture and core activation   Seated PWR! Step  2 x 15- 5#  Neuro seated on dynadisc to improve posture and core activation   STS X 10   therACT Cues for forward weight shift, without cues pt has tendency to have weight on heels upon standing and lose balance posteriorly   Walking over obsacles (2 hurdles, 1 1/2 FR X 6 times through SBA, Bilateral UE assist TherACt To improve ability to navigate objects in home setting and lift feet properly   Side stepping over 1/2 foam roller  X 10 ea side  B UE, SBA  TherACT To improve transitions   Walking over hurdles X 4 laps  B UE TherACT Improved foot clearance noted with external cue         Standing mobility: moving from Up walker to standing at balanc bar  X 1 bidirectional CGA TherACT -cues for proper sequencing of task, improved with increased  practice   Ambulation with UP walker X 150 feet CGA TherACT - Cues for step length and cadence a swell as maintaining correct posture in Up Walker -cues for large steps similar to hurdles      Treatment Provided this session   Pt educated throughout session about proper posture and technique with exercises. Improved exercise technique, movement at target joints, use of target muscles after min to mod verbal, visual, tactile cues.  Note: Portions of this document were prepared using Dragon voice recognition software and although reviewed may contain unintentional dictation errors in syntax, grammar, or spelling.  PT Education - 03/31/21 1341     Education provided Yes    Education Details Exercise form and technique    Person(s) Educated Patient    Methods Explanation    Comprehension Verbalized understanding              PT Short Term Goals - 03/08/21 1125       PT SHORT TERM GOAL #1   Title Pt to demonstrate improved sustained AMB tolerance >826f.    Baseline At evlauation: 6079ftolerance (not to failure); 9/26: 1384 ft    Time 4    Period Weeks    Status Achieved    Target Date 01/05/21      PT SHORT TERM GOAL #2   Title Pt to demonstrate [place] to show improvement in balance.    Baseline Eval: 15/28; 9/26: deferred, 03/08/21: 15/28    Time 4    Period Weeks    Status On-going    Target Date 02/08/21      PT SHORT TERM GOAL #3   Title Pt to demonstrate 10MWT<13sec to show self-selected gait speed appropriate for AMB outside of house and to improve energy efficiency.    Baseline UpWalker Eval: 17.53sec; 9/26: 0.74 m/s (13.5)11/21: 13.68 (.73 m/s)    Time 4    Period Weeks    Status Partially Met    Target Date 02/08/21               PT Long Term Goals - 03/08/21 1127       PT LONG TERM GOAL #1   Title Pt to score MiniBesTest > 20/28 to show improved balance.    Baseline Eval:  15/28; 9/26: deferred 11/21: 15/28    Time 8    Period Weeks    Status On-going    Target Date 05/31/21      PT LONG TERM GOAL #2   Title Pt to tolerate 100054fustained AMB with UpWalker without rest interval to improve safety and independence in limited community AMB.    Baseline At eval; tired after 600f72f/26: 1384 ft    Time 8    Period Weeks    Status Achieved    Target Date 05/03/21      PT LONG TERM GOAL #3   Title Pt to demonstrate improved FOTO score by 5 points compared to initital assessment.    Baseline 9/26: 49 (eval was 50) 911/21: 52    Time 8    Period Weeks    Status On-going    Target Date 05/03/21      PT LONG TERM GOAL #4   Title Pt to show tolerance of AMB>1300ft69fh UpWalker to improve tolerance to accessing the community for IADL.    Baseline Eval: 600ft;46f26: 1385    Time 12    Period Weeks    Status Achieved    Target Date 05/31/21      PT LONG TERM GOAL #5   Title Patient will increase six minute walk test distance to >1000 for progression to community ambulator and improve gait ability    Baseline 9/26: new 11/21: 850 feet with up Walker    Time 8    Period Weeks    Status On-going    Target Date 05/03/21                   Plan - 03/31/21 1116     Clinical Impression Statement Pt continues to deomnstrate great motivation for  completion of PT program. Pt continued with seated postural strengthening exercises this session and shows improved seated postural strength and seated stability. Continued with increased resistance utilizing black thera band for rows today and pt tolerated well. Pt progressed with standing balance and mobility exercises in parallel bars today. Pt will continue to benefit from skilled PT intervention in order to improve his fall risk, safety with everyday tasks, posture and overall mobility.    Personal Factors and Comorbidities Age;Past/Current Experience;Time since onset of  injury/illness/exacerbation;Transportation    Comorbidities Chronic back pain, orthostatic hypotension, repeated falls, parkinsonism    Examination-Activity Limitations Bathing;Bed Mobility;Caring for Others;Bend;Dressing;Lift;Locomotion Level;Reach Overhead;Sit;Squat;Stairs;Stand;Toileting;Transfers;Carry    Examination-Participation Restrictions Cleaning;Community Activity;Interpersonal Relationship;Shop;Volunteer;Yard Work;Other;Driving    Stability/Clinical Decision Making Evolving/Moderate complexity    Rehab Potential Fair    PT Frequency 2x / week    PT Duration 12 weeks    PT Treatment/Interventions ADLs/Self Care Home Management;Aquatic Therapy;Cryotherapy;Electrical Stimulation;Iontophoresis 94m/ml Dexamethasone;Moist Heat;Ultrasound;Contrast Bath;DME Instruction;Gait training;Stair training;Functional mobility training;Therapeutic activities;Therapeutic exercise;Balance training;Neuromuscular re-education;Patient/family education;Manual techniques;Wheelchair mobility training;Energy conservation;Passive range of motion;Traction;Orthotic Fit/Training;Dry needling;Vestibular;Visual/perceptual remediation/compensation;Taping;Canalith Repostioning;Cognitive remediation    PT Next Visit Plan Advanced AMB tolerance, use MiniBesTest to guide balance interventions seated rotation and balance exercises    PT Home Exercise Plan None set up at evaluation, still working on previous HEP from 6 months prior (STS, daily walking); 8/29: issued STS with focus on large amplitude movement at support surface; 9/12: PT provides handout with sit to stand exercise and large amplitude seated trunk rotation exercise.  9/19: reinforced HEP, provided handout for seated postural exercise with UE abduction, and seated exercise with trunk twists and UE reach, 10x for each, can be performed daily or minimum of 4x/week. No changes today.    Consulted and Agree with Plan of Care Patient;Family member/caregiver    Family  Member Consulted Wife             Patient will benefit from skilled therapeutic intervention in order to improve the following deficits and impairments:  Abnormal gait, Decreased balance, Decreased coordination, Decreased mobility, Postural dysfunction, Decreased strength, Decreased safety awareness, Improper body mechanics, Impaired flexibility, Decreased activity tolerance, Decreased endurance, Decreased knowledge of precautions, Difficulty walking, Pain, Cardiopulmonary status limiting activity, Decreased range of motion, Impaired perceived functional ability, Decreased cognition, Dizziness  Visit Diagnosis: Abnormality of gait and mobility  Difficulty in walking, not elsewhere classified  Unsteadiness on feet  Repeated falls     Problem List Patient Active Problem List   Diagnosis Date Noted   Sinus drainage 12/09/2020   Medicare annual wellness visit, subsequent 11/18/2020   Pacemaker    D[place]'s [place] 06/17/2020   Vertigo 02/19/2020   Urine abnormality 02/19/2020   Dysphagia 02/23/2019   Fall at home 10/18/2017   REM behavioral disorder 11/02/2016   Radicular pain in right arm 10/09/2016   GERD (gastroesophageal reflux disease) 07/31/2016   Colon cancer screening 07/23/2015   Gout 02/18/2015   Depression 01/06/2015   Skin lesion 10/02/2014   Dupuytren's contracture 08/20/2014   Cough 07/21/2014   Lumbar stenosis with neurogenic claudication 06/13/2014   S/P deep brain stimulator placement 05/08/2014   Advance care planning 01/19/2014   [place]'s [place] 12/13/2013   PTSD (post-traumatic stress disorder) 06/13/2013   Erectile dysfunction 06/13/2013   HLD (hyperlipidemia) 06/13/2013   Essential hypertension 06/03/2013   Bradycardia by electrocardiogram 06/03/2013   Obstructive sleep apnea 03/12/2013    CParticia Lather PT 03/31/2021, 5:01 PM  [place] [facility]  SERVICES [place], [place], [address] Phone: [phone]   Fax:  [phone]  Name: LANGFORD CARIAS MRN: [id number] Date of Birth: 10/14/1950

## 2021-04-05 ENCOUNTER — Encounter: Payer: Medicare PPO | Admitting: Speech Pathology

## 2021-04-05 ENCOUNTER — Ambulatory Visit: Payer: Medicare PPO

## 2021-04-05 ENCOUNTER — Other Ambulatory Visit: Payer: Self-pay

## 2021-04-05 DIAGNOSIS — R2681 Unsteadiness on feet: Secondary | ICD-10-CM

## 2021-04-05 DIAGNOSIS — R278 Other lack of coordination: Secondary | ICD-10-CM | POA: Diagnosis not present

## 2021-04-05 DIAGNOSIS — R296 Repeated falls: Secondary | ICD-10-CM | POA: Diagnosis not present

## 2021-04-05 DIAGNOSIS — R269 Unspecified abnormalities of gait and mobility: Secondary | ICD-10-CM | POA: Diagnosis not present

## 2021-04-05 DIAGNOSIS — G20 Parkinson's disease: Secondary | ICD-10-CM | POA: Diagnosis not present

## 2021-04-05 DIAGNOSIS — R2689 Other abnormalities of gait and mobility: Secondary | ICD-10-CM | POA: Diagnosis not present

## 2021-04-05 DIAGNOSIS — M6281 Muscle weakness (generalized): Secondary | ICD-10-CM | POA: Diagnosis not present

## 2021-04-05 DIAGNOSIS — R262 Difficulty in walking, not elsewhere classified: Secondary | ICD-10-CM | POA: Diagnosis not present

## 2021-04-05 NOTE — Therapy (Signed)
[facility] [address] [place], [place], [address] Phone: [phone]   Fax:  [phone]  Physical Therapy Treatment  Patient Details  Name: [name] MRN: [id number] Date of Birth: [birth year] Referring Provider (PT): Elsie Stain, MD   Encounter Date: 04/05/2021   PT End of Session - 04/05/21 1714     Visit Number 28    Number of Visits 48    Date for PT Re-Evaluation 05/31/21    Authorization Type [employer] PPO    Authorization Time Period recert for 0/72-25/75    Progress Note Due on Visit 30    PT Start Time 1146    PT Stop Time 1229    PT Time Calculation (min) 43 min    Equipment Utilized During Treatment Gait belt    Activity Tolerance Patient tolerated treatment well    Behavior During Therapy [facility] for tasks assessed/performed             Past Medical History:  Diagnosis Date   Arthritis    Bradycardia    Cancer ([place]) 2013   skin cancer   Depression    ptsd   Dysrhythmia    chronic slow heart rate   GERD (gastroesophageal reflux disease)    Headache(784.0)    tension headaches non recent   History of chicken pox    History of kidney stones    passed   Hypertension    treated with HCTZ   Pacemaker    Parkinson's disease ([place])    dx'ed 15 years ago   PTSD (post-traumatic stress disorder)    Shortness of breath dyspnea    Sleep apnea    doesn't use C-pap   Varicose veins     Past Surgical History:  Procedure Laterality Date   CHOLECYSTECTOMY N/A 10/22/2014   Procedure: LAPAROSCOPIC CHOLECYSTECTOMY WITH INTRAOPERATIVE CHOLANGIOGRAM;  Surgeon: Dia Crawford III, MD;  Location: [facility] ORS;  Service: General;  Laterality: N/A;   cyst removed      from lip as a child   INTRAMEDULLARY (IM) NAIL INTERTROCHANTERIC N/A 02/18/2019   Procedure: INTRAMEDULLARY (IM) NAIL INTERTROCHANTRIC, RIGHT,;  Surgeon: Thornton Park, MD;  Location: [facility] ORS;  Service: Orthopedics;  Laterality:  N/A;   LUMBAR LAMINECTOMY/DECOMPRESSION MICRODISCECTOMY Bilateral 12/14/2012   Procedure: Bilateral lumbar three-four, four-five decompressive laminotomy/foraminotomy;  Surgeon: Charlie Pitter, MD;  Location: [place] [facility];  Service: Neurosurgery;  Laterality: Bilateral;   PULSE GENERATOR IMPLANT Bilateral 12/13/2013   Procedure: Bilateral implantable pulse generator placement;  Surgeon: Erline Levine, MD;  Location: [place] [facility];  Service: Neurosurgery;  Laterality: Bilateral;  Bilateral implantable pulse generator placement   skin cancer removed     from ears,   12 lft arm  rt leg 15   SUBTHALAMIC STIMULATOR BATTERY REPLACEMENT Bilateral 07/14/2017   Procedure: BILATERAL IMPLANTED PULSE GENERATOR CHANGE FOR DEEP BRAIN STIMULATOR;  Surgeon: Erline Levine, MD;  Location: [place];  Service: Neurosurgery;  Laterality: Bilateral;   SUBTHALAMIC STIMULATOR INSERTION Bilateral 12/06/2013   Procedure: SUBTHALAMIC STIMULATOR INSERTION;  Surgeon: Erline Levine, MD;  Location: [place] [facility];  Service: Neurosurgery;  Laterality: Bilateral;  Bilateral deep brain stimulator placement    There were no vitals filed for this visit.   Subjective Assessment - 04/05/21 1713     Subjective Pt reports 1-2/10 pain in his back currently. Pt reports no stumbles or falls. Pt spouse does report pt seems to have more difficulty controlling forward lean and performing ADLs.  Patient is accompained by: Family member    Pertinent History [name] is a 52yoM who comes [facility] neuro for evaluation of conitnued difficulty of imbalance, unsteadiness on feet, and generally limited tolerance to mobility in the setting of chronic parkinsonism. SInce DC in February, pt has had PPM placement due to bradycardia issues. Pt also started using an UpWalker device 2 week prior to this evaluation, goal was to help with gradual melting of postural extension that occured with sustained upright actiivty. Pt has continued to practice supervised  AMB with caregivers, short distances out of house and in community. Pt continues to experience frequent falls on a weekly basis, at a rate that both he and wife report to be increasing in frequency. PMH: HTN, bradycardia s/p PPM, PD, DBS, COVID-19 infection, chronic low back pain s/p L4/5 surgery.    Limitations Walking;Standing;Sitting;House hold activities;Lifting    How long can you sit comfortably? Not limited    How long can you stand comfortably? 5 minutes with support    How long can you walk comfortably? ~5-6 minutes UpWalker Walker    Patient Stated Goals Reduce falls, improve AMB tolerance    Currently in Pain? Yes    Pain Score 2     Pain Location Back    Pain Orientation Lower    Pain Onset More than a month ago    Pain Onset More than a month ago            INTERVENTIONS   Exercise/Activity Sets x Reps x Resistance  Assistance  Charge tye  Comments  Rows 2 x 15 GTB    Neuro - seated on dynadisc to improve posture and core activation, additional cuing for upright posture throughout    Hor ABD  10x each side GTB   Neuro seated on dynadisc to improve posture and core activation  -continued cues to prevent forward lean and promote upright posture  shoulder flex with  10x each side GTB   Neuro seated on dynadisc to improve posture and core activation, pt continues to struggle with forward lean particularly with increased fatigue  STS with red weighted pball ball 2x5   TherEX Cuing for upright posture, improved quad activation and hip extension.  STS 2X 10    TherEx Cues for upright posture, improved quad activation and hip extension, pt tendency to flex knees  Agility ladder Walking over obsacles followed by laps around clinic 6x through agility ladder and 3 full laps (1 lap = 148 ft) around clinic CGA Gait Pt uses upwalker. Performed to promote step length with gait and safe foot clearance (improved DF focus). Cuing throughout for proper sequencing.       Pt educated  throughout session about proper posture and technique with exercises. Improved exercise technique, movement at target joints, use of target muscles after min to mod verbal, visual, tactile cues.      PT Education - 04/05/21 1714     Education provided Yes    Education Details exercise technique, body mechanics    Person(s) Educated Patient    Methods Explanation;Demonstration;Tactile cues;Verbal cues    Comprehension Verbalized understanding;Returned demonstration;Verbal cues required;Tactile cues required;Need further instruction              PT Short Term Goals - 03/08/21 1125       PT SHORT TERM GOAL #1   Title Pt to demonstrate improved sustained AMB tolerance >891ft.    Baseline At evlauation: 624ft tolerance (not to failure); 9/26:  1384 ft    Time 4    Period Weeks    Status Achieved    Target Date 01/05/21      PT SHORT TERM GOAL #2   Title Pt to demonstrate [place] >17/28 to show improvement in balance.    Baseline Eval: 15/28; 9/26: deferred, 03/08/21: 15/28    Time 4    Period Weeks    Status On-going    Target Date 02/08/21      PT SHORT TERM GOAL #3   Title Pt to demonstrate 10MWT<13sec to show self-selected gait speed appropriate for AMB outside of house and to improve energy efficiency.    Baseline UpWalker Eval: 17.53sec; 9/26: 0.74 m/s (13.5)11/21: 13.68 (.73 m/s)    Time 4    Period Weeks    Status Partially Met    Target Date 02/08/21               PT Long Term Goals - 03/08/21 1127       PT LONG TERM GOAL #1   Title Pt to score MiniBesTest > 20/28 to show improved balance.    Baseline Eval: 15/28; 9/26: deferred 11/21: 15/28    Time 8    Period Weeks    Status On-going    Target Date 05/31/21      PT LONG TERM GOAL #2   Title Pt to tolerate 1039ft sustained AMB with UpWalker without rest interval to improve safety and independence in limited community AMB.    Baseline At eval; tired after 610ft; 9/26: 1384 ft    Time 8     Period Weeks    Status Achieved    Target Date 05/03/21      PT LONG TERM GOAL #3   Title Pt to demonstrate improved FOTO score by 5 points compared to initital assessment.    Baseline 9/26: 49 (eval was 50) 911/21: 52    Time 8    Period Weeks    Status On-going    Target Date 05/03/21      PT LONG TERM GOAL #4   Title Pt to show tolerance of AMB>1329ft with UpWalker to improve tolerance to accessing the community for IADL.    Baseline Eval: 660ft;  9/26: 1385    Time 12    Period Weeks    Status Achieved    Target Date 05/31/21      PT LONG TERM GOAL #5   Title Patient will increase six minute walk test distance to >1000 for progression to community ambulator and improve gait ability    Baseline 9/26: new 11/21: 850 feet with up Walker    Time 8    Period Weeks    Status On-going    Target Date 05/03/21                   Plan - 04/05/21 1719     Clinical Impression Statement Pt highly motivated throughout session. The pt responds well to interventions without increase in LBP. Focus was on promoting upright posture and safety as pt has tendency to flex forward with seated and standing activities. The pt will benefit from further skilled PT to improve mobility, gait, strength, and balance to increase QOL and decrease fall risk.    Personal Factors and Comorbidities Age;Past/Current Experience;Time since onset of injury/illness/exacerbation;Transportation    Comorbidities Chronic back pain, orthostatic hypotension, repeated falls, parkinsonism    Examination-Activity Limitations Bathing;Bed Mobility;Caring for Others;Bend;Dressing;Lift;Locomotion Level;Reach Overhead;Sit;Squat;Stairs;Stand;Toileting;Transfers;Carry    Examination-Participation Restrictions Cleaning;Community  Activity;Interpersonal Relationship;Shop;Volunteer;Yard Work;Other;Driving    Stability/Clinical Decision Making Evolving/Moderate complexity    Rehab Potential Fair    PT Frequency 2x / week    PT  Duration 12 weeks    PT Treatment/Interventions ADLs/Self Care Home Management;Aquatic Therapy;Cryotherapy;Electrical Stimulation;Iontophoresis 4mg /ml Dexamethasone;Moist Heat;Ultrasound;Contrast Bath;DME Instruction;Gait training;Stair training;Functional mobility training;Therapeutic activities;Therapeutic exercise;Balance training;Neuromuscular re-education;Patient/family education;Manual techniques;Wheelchair mobility training;Energy conservation;Passive range of motion;Traction;Orthotic Fit/Training;Dry needling;Vestibular;Visual/perceptual remediation/compensation;Taping;Canalith Repostioning;Cognitive remediation    PT Next Visit Plan Advanced AMB tolerance, use MiniBesTest to guide balance interventions seated rotation and balance exercises    PT Home Exercise Plan None set up at evaluation, still working on previous HEP from 6 months prior (STS, daily walking); 8/29: issued STS with focus on large amplitude movement at support surface; 9/12: PT provides handout with sit to stand exercise and large amplitude seated trunk rotation exercise.  9/19: reinforced HEP, provided handout for seated postural exercise with UE abduction, and seated exercise with trunk twists and UE reach, 10x for each, can be performed daily or minimum of 4x/week. No changes today.    Consulted and Agree with Plan of Care Patient;Family member/caregiver    Family Member Consulted Wife             Patient will benefit from skilled therapeutic intervention in order to improve the following deficits and impairments:  Abnormal gait, Decreased balance, Decreased coordination, Decreased mobility, Postural dysfunction, Decreased strength, Decreased safety awareness, Improper body mechanics, Impaired flexibility, Decreased activity tolerance, Decreased endurance, Decreased knowledge of precautions, Difficulty walking, Pain, Cardiopulmonary status limiting activity, Decreased range of motion, Impaired perceived functional ability,  Decreased cognition, Dizziness  Visit Diagnosis: Other abnormalities of gait and mobility  Muscle weakness (generalized)  Unsteadiness on feet  Other lack of coordination  Parkinson's disease ([place])     Problem List Patient Active Problem List   Diagnosis Date Noted   Sinus drainage 12/09/2020   Medicare annual wellness visit, subsequent 11/18/2020   Pacemaker    Dementia associated with Parkinson's disease ([place]) 06/17/2020   Vertigo 02/19/2020   Urine abnormality 02/19/2020   Dysphagia 02/23/2019   Fall at home 10/18/2017   REM behavioral disorder 11/02/2016   Radicular pain in right arm 10/09/2016   GERD (gastroesophageal reflux disease) 07/31/2016   Colon cancer screening 07/23/2015   Gout 02/18/2015   Depression 01/06/2015   Skin lesion 10/02/2014   Dupuytren's contracture 08/20/2014   Cough 07/21/2014   Lumbar stenosis with neurogenic claudication 06/13/2014   S/P deep brain stimulator placement 05/08/2014   Advance care planning 01/19/2014   Parkinson's disease ([place]) 12/13/2013   PTSD (post-traumatic stress disorder) 06/13/2013   Erectile dysfunction 06/13/2013   HLD (hyperlipidemia) 06/13/2013   Essential hypertension 06/03/2013   Bradycardia by electrocardiogram 06/03/2013   Obstructive sleep apnea 03/12/2013    Zollie Pee, PT 04/05/2021, 5:22 PM  [facility] [address] [place], [place], [address] Phone: [phone]   Fax:  [phone]  Name: [name] MRN: [id number] Date of Birth: [birth year]

## 2021-04-07 ENCOUNTER — Other Ambulatory Visit: Payer: Self-pay

## 2021-04-07 ENCOUNTER — Ambulatory Visit: Payer: Medicare PPO | Admitting: Physical Therapy

## 2021-04-07 ENCOUNTER — Encounter: Payer: Medicare PPO | Admitting: Speech Pathology

## 2021-04-07 DIAGNOSIS — R269 Unspecified abnormalities of gait and mobility: Secondary | ICD-10-CM | POA: Diagnosis not present

## 2021-04-07 DIAGNOSIS — R2681 Unsteadiness on feet: Secondary | ICD-10-CM

## 2021-04-07 DIAGNOSIS — R262 Difficulty in walking, not elsewhere classified: Secondary | ICD-10-CM | POA: Diagnosis not present

## 2021-04-07 DIAGNOSIS — R2689 Other abnormalities of gait and mobility: Secondary | ICD-10-CM

## 2021-04-07 DIAGNOSIS — G20 Parkinson's disease: Secondary | ICD-10-CM | POA: Diagnosis not present

## 2021-04-07 DIAGNOSIS — R278 Other lack of coordination: Secondary | ICD-10-CM | POA: Diagnosis not present

## 2021-04-07 DIAGNOSIS — M6281 Muscle weakness (generalized): Secondary | ICD-10-CM

## 2021-04-07 DIAGNOSIS — R296 Repeated falls: Secondary | ICD-10-CM | POA: Diagnosis not present

## 2021-04-07 NOTE — Therapy (Signed)
[place] MAIN [facility] SERVICES [address] [place], [place], [address] Phone: [phone]   Fax:  [phone]  Physical Therapy Treatment  Patient Details  Name: [name] MRN: [id number] Date of Birth: 1950-09-14 Referring Provider (PT): Elsie Stain, MD   Encounter Date: 04/07/2021   PT End of Session - 04/07/21 1121     Visit Number 29    Number of Visits 63    Date for PT Re-Evaluation 05/31/21    Authorization Type [employer] Choice PPO    Authorization Time Period recert for 4/92-01/00    Progress Note Due on Visit 30    PT Start Time 1101    PT Stop Time 1144    PT Time Calculation (min) 43 min    Equipment Utilized During Treatment Gait belt    Activity Tolerance Patient tolerated treatment well    Behavior During Therapy WFL for tasks assessed/performed             Past Medical History:  Diagnosis Date   Arthritis    Bradycardia    Cancer ([place]) 2013   skin cancer   Depression    ptsd   Dysrhythmia    chronic slow heart rate   GERD (gastroesophageal reflux disease)    Headache(784.0)    tension headaches non recent   History of chicken pox    History of kidney stones    passed   Hypertension    treated with HCTZ   Pacemaker    Parkinson's disease ([place])    dx'ed 15 years ago   PTSD (post-traumatic stress disorder)    Shortness of breath dyspnea    Sleep apnea    doesn't use C-pap   Varicose veins     Past Surgical History:  Procedure Laterality Date   CHOLECYSTECTOMY N/A 10/22/2014   Procedure: LAPAROSCOPIC CHOLECYSTECTOMY WITH INTRAOPERATIVE CHOLANGIOGRAM;  Surgeon: Dia Crawford III, MD;  Location: [facility] ORS;  Service: General;  Laterality: N/A;   cyst removed      from lip as a child   INTRAMEDULLARY (IM) NAIL INTERTROCHANTERIC N/A 02/18/2019   Procedure: INTRAMEDULLARY (IM) NAIL INTERTROCHANTRIC, RIGHT,;  Surgeon: Thornton Park, MD;  Location: [facility] ORS;  Service: Orthopedics;  Laterality:  N/A;   LUMBAR LAMINECTOMY/DECOMPRESSION MICRODISCECTOMY Bilateral 12/14/2012   Procedure: Bilateral lumbar three-four, four-five decompressive laminotomy/foraminotomy;  Surgeon: Charlie Pitter, MD;  Location: [place] [facility] OR[facility];  Service: Neurosurgery;  Laterality: Bilateral;   PULSE GENERATOR IMPLANT Bilateral 12/13/2013   Procedure: Bilateral implantable pulse generator placement;  Surgeon: Erline Levine, MD;  Location: [place] [facility] ORS;  Service: Neurosurgery;  Laterality: Bilateral;  Bilateral implantable pulse generator placement   skin cancer removed     from ears,   12 lft arm  rt leg 15   SUBTHALAMIC STIMULATOR BATTERY REPLACEMENT Bilateral 07/14/2017   Procedure: BILATERAL IMPLANTED PULSE GENERATOR CHANGE FOR DEEP BRAIN STIMULATOR;  Surgeon: Erline Levine, MD;  Location: [place];  Service: Neurosurgery;  Laterality: Bilateral;   SUBTHALAMIC STIMULATOR INSERTION Bilateral 12/06/2013   Procedure: SUBTHALAMIC STIMULATOR INSERTION;  Surgeon: Erline Levine, MD;  Location: [place] [facility] ORS;  Service: Neurosurgery;  Laterality: Bilateral;  Bilateral deep brain stimulator placement    There were no vitals filed for this visit.   Subjective Assessment - 04/07/21 1105     Subjective Pt reports 4/10 pain in his back currently. Pt reports no stumbles or falls. Pt spouse does report pt seems to have more difficulty controlling forward lean and performing ADLs.  Patient is accompained by: Family member    Pertinent History [name] is a 1yoM who comes to [facility] neuro for evaluation of conitnued difficulty of imbalance, unsteadiness on feet, and generally limited tolerance to mobility in the setting of chronic parkinsonism. SInce DC in February, pt has had PPM placement due to bradycardia issues. Pt also started using an UpWalker device 2 week prior to this evaluation, goal was to help with gradual melting of postural extension that occured with sustained upright actiivty. Pt has continued to practice supervised AMB  with caregivers, short distances out of house and in community. Pt continues to experience frequent falls on a weekly basis, at a rate that both he and wife report to be increasing in frequency. PMH: HTN, bradycardia s/p PPM, PD, DBS, COVID-19 infection, chronic low back pain s/p L4/5 surgery.    Limitations Walking;Standing;Sitting;House hold activities;Lifting    How long can you sit comfortably? Not limited    How long can you stand comfortably? 5 minutes with support    How long can you walk comfortably? ~5-6 minutes UpWalker Walker    Patient Stated Goals Reduce falls, improve AMB tolerance    Currently in Pain? Yes    Pain Score 4     Pain Location Back    Pain Orientation Lower    Pain Descriptors / Indicators Dull    Pain Type Chronic pain    Pain Onset More than a month ago    Pain Onset More than a month ago              INTERVENTIONS   Exercise/Activity Sets x Reps x Resistance  Assistance  Charge tye  Comments  Rows 2 x 15 Black TB    Neuro - seated on dynadisc to improve posture and core activation, additional cuing for upright posture throughout    Hor ABD  10x each side GTB   Neuro seated on dynadisc to improve posture and core activation  -continued cues to prevent forward lean and promote upright posture  shoulder flex with  10x each side GTB   Neuro seated on dynadisc to improve posture and core activation, pt continues to struggle with forward lean particularly with increased fatigue  STS with blued weighted ball 2x5   TherACT Cuing for upright posture, improved quad activation and hip extension.  STS 2X 10    TherACT Cues for upright posture, improved quad activation and hip extension, pt tendency to flex knees  Agility ladder  68 x through ladder  CGA Neuro  In // bars. Performed to promote step length with gait and safe foot clearance (improved DF focus). Cuing throughout for proper sequencing.    Exercise/Activity Sets/Reps/Time/ Resistance Assistance Charge  type Comments  Side step with cues for large step  2 x 10 bilaterally 1 x 10 no UE assist UE TherACT Cues for large step length and for proper sequencing                                                               Treatment Provided this session   Pt educated throughout session about proper posture and technique with exercises. Improved exercise technique, movement at target joints, use of target muscles after min to mod verbal, visual, tactile cues. Note: Portions of  this document were prepared using Dragon voice recognition software and although reviewed may contain unintentional dictation errors in syntax, grammar, or spelling.    Pt educated throughout session about proper posture and technique with exercises. Improved exercise technique, movement at target joints, use of target muscles after min to mod verbal, visual, tactile cues.                               PT Short Term Goals - 03/08/21 1125       PT SHORT TERM GOAL #1   Title Pt to demonstrate improved sustained AMB tolerance >860ft.    Baseline At evlauation: 662ft tolerance (not to failure); 9/26: 1384 ft    Time 4    Period Weeks    Status Achieved    Target Date 01/05/21      PT SHORT TERM GOAL #2   Title Pt to demonstrate MiniBesTestScore >17/28 to show improvement in balance.    Baseline Eval: 15/28; 9/26: deferred, 03/08/21: 15/28    Time 4    Period Weeks    Status On-going    Target Date 02/08/21      PT SHORT TERM GOAL #3   Title Pt to demonstrate 10MWT<13sec to show self-selected gait speed appropriate for AMB outside of house and to improve energy efficiency.    Baseline UpWalker Eval: 17.53sec; 9/26: 0.74 m/s (13.5)11/21: 13.68 (.73 m/s)    Time 4    Period Weeks    Status Partially Met    Target Date 02/08/21               PT Long Term Goals - 03/08/21 1127       PT LONG TERM GOAL #1   Title Pt to score MiniBesTest > 20/28 to show improved balance.     Baseline Eval: 15/28; 9/26: deferred 11/21: 15/28    Time 8    Period Weeks    Status On-going    Target Date 05/31/21      PT LONG TERM GOAL #2   Title Pt to tolerate 1025ft sustained AMB with UpWalker without rest interval to improve safety and independence in limited community AMB.    Baseline At eval; tired after 6104ft; 9/26: 1384 ft    Time 8    Period Weeks    Status Achieved    Target Date 05/03/21      PT LONG TERM GOAL #3   Title Pt to demonstrate improved FOTO score by 5 points compared to initital assessment.    Baseline 9/26: 49 (eval was 50) 911/21: 52    Time 8    Period Weeks    Status On-going    Target Date 05/03/21      PT LONG TERM GOAL #4   Title Pt to show tolerance of AMB>1341ft with UpWalker to improve tolerance to accessing the community for IADL.    Baseline Eval: 642ft;  9/26: 1385    Time 12    Period Weeks    Status Achieved    Target Date 05/31/21      PT LONG TERM GOAL #5   Title Patient will increase six minute walk test distance to >1000 for progression to community ambulator and improve gait ability    Baseline 9/26: new 11/21: 850 feet with up Walker    Time 8    Period Weeks    Status On-going    Target Date 05/03/21  Plan - 04/07/21 1122     Clinical Impression Statement Pt presents with excellent motivation for copmletion of PT program.Pt reports no increase in LBP at this time. Pt continues to demonstrate postural improvements during sessions.  Pt was able to tolerate ambulation in // bars with ladder with improved efficacy at this time. Pt also demonstrates improved step length with side stapping in // bars. Pt still having requent falls and LOB at home and continues to require skilled PT intervention to decrease frequency of falls and improve safety at home.    Personal Factors and Comorbidities Age;Past/Current Experience;Time since onset of injury/illness/exacerbation;Transportation    Comorbidities  Chronic back pain, orthostatic hypotension, repeated falls, parkinsonism    Examination-Activity Limitations Bathing;Bed Mobility;Caring for Others;Bend;Dressing;Lift;Locomotion Level;Reach Overhead;Sit;Squat;Stairs;Stand;Toileting;Transfers;Carry    Examination-Participation Restrictions Cleaning;Community Activity;Interpersonal Relationship;Shop;Volunteer;Yard Work;Other;Driving    Stability/Clinical Decision Making Evolving/Moderate complexity    Rehab Potential Fair    PT Frequency 2x / week    PT Duration 12 weeks    PT Treatment/Interventions ADLs/Self Care Home Management;Aquatic Therapy;Cryotherapy;Electrical Stimulation;Iontophoresis 4mg /ml Dexamethasone;Moist Heat;Ultrasound;Contrast Bath;DME Instruction;Gait training;Stair training;Functional mobility training;Therapeutic activities;Therapeutic exercise;Balance training;Neuromuscular re-education;Patient/family education;Manual techniques;Wheelchair mobility training;Energy conservation;Passive range of motion;Traction;Orthotic Fit/Training;Dry needling;Vestibular;Visual/perceptual remediation/compensation;Taping;Canalith Repostioning;Cognitive remediation    PT Next Visit Plan Advanced AMB tolerance, use MiniBesTest to guide balance interventions seated rotation and balance exercises    PT Home Exercise Plan None set up at evaluation, still working on previous HEP from 6 months prior (STS, daily walking); 8/29: issued STS with focus on large amplitude movement at support surface; 9/12: PT provides handout with sit to stand exercise and large amplitude seated trunk rotation exercise.  9/19: reinforced HEP, provided handout for seated postural exercise with UE abduction, and seated exercise with trunk twists and UE reach, 10x for each, can be performed daily or minimum of 4x/week. No changes today.    Consulted and Agree with Plan of Care Patient;Family member/caregiver    Family Member Consulted Wife             Patient will benefit  from skilled therapeutic intervention in order to improve the following deficits and impairments:  Abnormal gait, Decreased balance, Decreased coordination, Decreased mobility, Postural dysfunction, Decreased strength, Decreased safety awareness, Improper body mechanics, Impaired flexibility, Decreased activity tolerance, Decreased endurance, Decreased knowledge of precautions, Difficulty walking, Pain, Cardiopulmonary status limiting activity, Decreased range of motion, Impaired perceived functional ability, Decreased cognition, Dizziness  Visit Diagnosis: Other abnormalities of gait and mobility  Unsteadiness on feet  Muscle weakness (generalized)  Abnormality of gait and mobility     Problem List Patient Active Problem List   Diagnosis Date Noted   Sinus drainage 12/09/2020   Medicare annual wellness visit, subsequent 11/18/2020   Pacemaker    Dementia associated with Parkinson's disease ([place]) 06/17/2020   Vertigo 02/19/2020   Urine abnormality 02/19/2020   Dysphagia 02/23/2019   Fall at home 10/18/2017   REM behavioral disorder 11/02/2016   Radicular pain in right arm 10/09/2016   GERD (gastroesophageal reflux disease) 07/31/2016   Colon cancer screening 07/23/2015   Gout 02/18/2015   Depression 01/06/2015   Skin lesion 10/02/2014   Dupuytren's contracture 08/20/2014   Cough 07/21/2014   Lumbar stenosis with neurogenic claudication 06/13/2014   S/P deep brain stimulator placement 05/08/2014   Advance care planning 01/19/2014   Parkinson's disease ([place]) 12/13/2013   PTSD (post-traumatic stress disorder) 06/13/2013   Erectile dysfunction 06/13/2013   HLD (hyperlipidemia) 06/13/2013   Essential hypertension 06/03/2013   Bradycardia by electrocardiogram 06/03/2013  Obstructive sleep apnea 03/12/2013    Particia Lather, PT 04/07/2021, 5:19 PM  [place] MAIN [facility] SERVICES [address] [place], [place], [address] Phone:  [phone]   Fax:  [phone]  Name: TERRAL COOKS MRN: [id number] Date of Birth: 08/16/1950

## 2021-04-08 ENCOUNTER — Ambulatory Visit (INDEPENDENT_AMBULATORY_CARE_PROVIDER_SITE_OTHER): Payer: Medicare PPO | Admitting: Psychology

## 2021-04-08 DIAGNOSIS — F4323 Adjustment disorder with mixed anxiety and depressed mood: Secondary | ICD-10-CM

## 2021-04-08 DIAGNOSIS — F331 Major depressive disorder, recurrent, moderate: Secondary | ICD-10-CM

## 2021-04-08 NOTE — Progress Notes (Signed)
[name] Counselor/Therapist Progress Note  Patient ID: [name], MRN: [id number],    Date: 04/08/2021  Time Spent: 30 minutes  Treatment Type: Individual Therapy  Reported Symptoms: sadness, and boredom  Mental Status Exam: Appearance:  Casual     Behavior: Drowsy  Motor: Psychomotor Retardation  Speech/Language:  Slow  Affect: Blunt  Mood: normal  Thought process: concrete  Thought content:   WNL  Sensory/Perceptual disturbances:   WNL  Orientation: oriented to person, place, time/date, and situation  Attention: Fair  Concentration: Fair  Memory: Immediate;   [place] of knowledge:  Fair  Insight:   Fair  Judgment:  Fair  Impulse Control: Fair   Risk Assessment: Danger to Self:  No Self-injurious Behavior: No Danger to Others: No Duty to Warn:no Physical Aggression / Violence:No  Access to Firearms a concern: No  Gang Involvement:No   Subjective: The patient attended a face-to-face individual therapy session via video visit today.  The patient gave verbal consent for the video to be on WebEx.  The patient was in his home alone and therapist was in the office.  The patient reports that he is doing well and that they are getting together for Christmas at his house.  The patient states that the grandchildren are coming and seems somewhat excited about this.  The patient seems to have more structure to his thoughts however he does require a lot of prompting to talk.  The patient denies having anything that he is concerned about or worried about.  The patient is doing well and sessions primarily are to check in and give him the opportunity to talk about things that are bothering him.  Interventions: Ego-Supportive  Diagnosis:Major depressive disorder, recurrent episode, moderate (HCC)  Adjustment disorder with mixed anxiety and depressed mood  Plan: Please see Treatment plan in Therapy Charts with target date of 04/30/2021 for goals and progress  towards goals.  Plan is to continue to offer supportive therapy to patient to help him deal with his depression related to his Parkinsons diagnosis.  Patient approved Treatment Plan in Therapy Charts.  Sabrina Keough G [name], LCSW

## 2021-04-14 ENCOUNTER — Encounter: Payer: Medicare PPO | Admitting: Speech Pathology

## 2021-04-14 ENCOUNTER — Encounter: Payer: Self-pay | Admitting: Physical Therapy

## 2021-04-14 ENCOUNTER — Ambulatory Visit: Payer: Medicare PPO | Admitting: Physical Therapy

## 2021-04-14 ENCOUNTER — Other Ambulatory Visit: Payer: Self-pay

## 2021-04-14 DIAGNOSIS — R269 Unspecified abnormalities of gait and mobility: Secondary | ICD-10-CM

## 2021-04-14 DIAGNOSIS — M6281 Muscle weakness (generalized): Secondary | ICD-10-CM

## 2021-04-14 DIAGNOSIS — R2689 Other abnormalities of gait and mobility: Secondary | ICD-10-CM

## 2021-04-14 DIAGNOSIS — R2681 Unsteadiness on feet: Secondary | ICD-10-CM | POA: Diagnosis not present

## 2021-04-14 DIAGNOSIS — R296 Repeated falls: Secondary | ICD-10-CM | POA: Diagnosis not present

## 2021-04-14 DIAGNOSIS — R262 Difficulty in walking, not elsewhere classified: Secondary | ICD-10-CM | POA: Diagnosis not present

## 2021-04-14 DIAGNOSIS — R278 Other lack of coordination: Secondary | ICD-10-CM | POA: Diagnosis not present

## 2021-04-14 DIAGNOSIS — G20 Parkinson's disease: Secondary | ICD-10-CM | POA: Diagnosis not present

## 2021-04-14 NOTE — Therapy (Addendum)
[place] MAIN [facility] SERVICES [address] [place], [place], [address] Phone: [phone]   Fax:  [phone]  Physical Therapy Treatment/Physical Therapy Progress Note   Dates of reporting period  03/08/21   to   04/14/21   Patient Details  Name: [name] MRN: [id number] Date of Birth: 01/09/1951 Referring Provider (PT): Elsie Stain, MD   Encounter Date: 04/14/2021   PT End of Session - 04/14/21 1240     Visit Number 30    Number of Visits 48    Date for PT Re-Evaluation 05/31/21    Authorization Type [employer] PPO    Authorization Time Period recert for 6/83-41/96    Progress Note Due on Visit 30    PT Start Time 1101    PT Stop Time 1144    PT Time Calculation (min) 43 min    Equipment Utilized During Treatment Gait belt    Activity Tolerance Patient tolerated treatment well    Behavior During Therapy [facility] for tasks assessed/performed             Past Medical History:  Diagnosis Date   Arthritis    Bradycardia    Cancer ([place]) 2013   skin cancer   Depression    ptsd   Dysrhythmia    chronic slow heart rate   GERD (gastroesophageal reflux disease)    Headache(784.0)    tension headaches non recent   History of chicken pox    History of kidney stones    passed   Hypertension    treated with HCTZ   Pacemaker    Parkinson's disease ([place])    dx'ed 15 years ago   PTSD (post-traumatic stress disorder)    Shortness of breath dyspnea    Sleep apnea    doesn't use C-pap   Varicose veins     Past Surgical History:  Procedure Laterality Date   CHOLECYSTECTOMY N/A 10/22/2014   Procedure: LAPAROSCOPIC CHOLECYSTECTOMY WITH INTRAOPERATIVE CHOLANGIOGRAM;  Surgeon: Dia Crawford III, MD;  Location: [facility] ORS;  Service: General;  Laterality: N/A;   cyst removed      from lip as a child   INTRAMEDULLARY (IM) NAIL INTERTROCHANTERIC N/A 02/18/2019   Procedure: INTRAMEDULLARY (IM) NAIL INTERTROCHANTRIC, RIGHT,;   Surgeon: Thornton Park, MD;  Location: [facility] ORS;  Service: Orthopedics;  Laterality: N/A;   LUMBAR LAMINECTOMY/DECOMPRESSION MICRODISCECTOMY Bilateral 12/14/2012   Procedure: Bilateral lumbar three-four, four-five decompressive laminotomy/foraminotomy;  Surgeon: Charlie Pitter, MD;  Location: [place] NEURO ORS;  Service: Neurosurgery;  Laterality: Bilateral;   PULSE GENERATOR IMPLANT Bilateral 12/13/2013   Procedure: Bilateral implantable pulse generator placement;  Surgeon: Erline Levine, MD;  Location: [place] NEURO ORS;  Service: Neurosurgery;  Laterality: Bilateral;  Bilateral implantable pulse generator placement   skin cancer removed     from ears,   12 lft arm  rt leg 15   SUBTHALAMIC STIMULATOR BATTERY REPLACEMENT Bilateral 07/14/2017   Procedure: BILATERAL IMPLANTED PULSE GENERATOR CHANGE FOR DEEP BRAIN STIMULATOR;  Surgeon: Erline Levine, MD;  Location: [place];  Service: Neurosurgery;  Laterality: Bilateral;   SUBTHALAMIC STIMULATOR INSERTION Bilateral 12/06/2013   Procedure: SUBTHALAMIC STIMULATOR INSERTION;  Surgeon: Erline Levine, MD;  Location: [place] NEURO ORS;  Service: Neurosurgery;  Laterality: Bilateral;  Bilateral deep brain stimulator placement    There were no vitals filed for this visit.   Subjective Assessment - 04/14/21 1102     Subjective Pt reports 1 fall since last visit. Reports he was walking around bed  and lost his balance. Reports hitting his head but no significant pain has been present since.    Patient is accompained by: Family member    Pertinent History [name] is a 31yoM who comes to [facility] neuro for evaluation of conitnued difficulty of imbalance, unsteadiness on feet, and generally limited tolerance to mobility in the setting of chronic parkinsonism. SInce DC in February, pt has had PPM placement due to bradycardia issues. Pt also started using an UpWalker device 2 week prior to this evaluation, goal was to help with gradual melting of postural extension that occured  with sustained upright actiivty. Pt has continued to practice supervised AMB with caregivers, short distances out of house and in community. Pt continues to experience frequent falls on a weekly basis, at a rate that both he and wife report to be increasing in frequency. PMH: HTN, bradycardia s/p PPM, PD, DBS, COVID-19 infection, chronic low back pain s/p L4/5 surgery.    Limitations Walking;Standing;Sitting;House hold activities;Lifting    How long can you sit comfortably? Not limited    How long can you stand comfortably? 5 minutes with support    How long can you walk comfortably? ~5-6 minutes UpWalker Walker    Patient Stated Goals Reduce falls, improve AMB tolerance    Currently in Pain? Yes    Pain Onset More than a month ago    Pain Onset More than a month ago                [facility] PT Assessment - 04/14/21 0001       Berg Balance Test   Sit to Stand Able to stand  independently using hands    Standing Unsupported Able to stand safely 2 minutes    Sitting with Back Unsupported but Feet Supported on Floor or Stool Able to sit safely and securely 2 minutes    Stand to Sit Sits safely with minimal use of hands    Transfers Able to transfer safely, minor use of hands    Standing Unsupported with Eyes Closed Able to stand 10 seconds with supervision    Standing Unsupported with Feet Together Able to place feet together independently and stand for 1 minute with supervision    From Standing, Reach Forward with Outstretched Arm Can reach forward >12 cm safely (5")    From Standing Position, Pick up Object from Floor Able to pick up shoe, needs supervision    From Standing Position, Turn to Look Behind Over each Shoulder Looks behind from both sides and weight shifts well    Turn 360 Degrees Able to turn 360 degrees safely but slowly    Standing Unsupported, Alternately Place Feet on Step/Stool Able to complete >2 steps/needs minimal assist    Standing Unsupported, One Foot in Front Able  to take small step independently and hold 30 seconds    Standing on One Leg Tries to lift leg/unable to hold 3 seconds but remains standing independently    Total Score 41           Physical therapy treatment session today consisted of completing assessment of goals and administration of testing as demonstrated in flow sheet. Addition treatments may be found below.    NTERVENTIONS   Exercise/Activity Sets x Reps x Resistance  Assistance  Charge tye  Comments  Rows 2 x 15 Black TB    Neuro - seated on dynadisc to improve posture and core activation, additional cuing for upright posture throughout    [facility]  ABD  10x each side GTB   Neuro seated on dynadisc to improve posture and core activation  -continued cues to prevent forward lean and promote upright posture   LAQ  2 x 10 5# AW  neuro - seated on dynadisc to improve posture and core activation, additional cuing for upright posture throughout  6MWT   therex 925 feet with up walker   10MWT   therex .79 m/s                                            Treatment Provided this session   Pt educated throughout session about proper posture and technique with exercises. Improved exercise technique, movement at target joints, use of target muscles after min to mod verbal, visual, tactile cues. Note: Portions of this document were prepared using Dragon voice recognition software and although reviewed may contain unintentional dictation errors in syntax, grammar, or spelling.    Pt educated throughout session about proper posture and technique with exercises. Improved exercise technique, movement at target joints, use of target muscles after min to mod verbal, visual, tactile cues.                         PT Short Term Goals - 04/14/21 1120       PT SHORT TERM GOAL #1   Title Pt to demonstrate improved sustained AMB tolerance >847ft.    Baseline At evlauation: 688ft tolerance (not to failure); 9/26: 1384 ft    Time  4    Period Weeks    Status Achieved    Target Date 01/05/21      PT SHORT TERM GOAL #2   Title Pt will improve BERG balance score to greater than 45 to indicate decrease risk of falls.    Baseline Eval: 15/28; 9/26: deferred, 03/08/21: 15/28    Time 4    Period Weeks    Status On-going    Target Date 05/12/21      PT SHORT TERM GOAL #3   Title Pt to demonstrate 10MWT<13sec to show self-selected gait speed appropriate for AMB outside of house and to improve energy efficiency.    Baseline UpWalker Eval: 17.53sec; 9/26: 0.74 m/s (13.5)11/21: 13.68 (.73 m/s) 12.64 on 12/28    Time 4    Period Weeks    Status Achieved    Target Date 02/08/21               PT Long Term Goals - 04/14/21 1458       PT LONG TERM GOAL #1   Title Pt will improve BERG balance score to greater than 47 to indicate decreaed risk of falls.    Baseline 41 on 12/28    Time 8    Period Weeks    Status On-going    Target Date 05/31/21      PT LONG TERM GOAL #2   Title Pt to tolerate 1019ft sustained AMB with UpWalker without rest interval to improve safety and independence in limited community AMB.    Baseline At eval; tired after 667ft; 9/26: 1384 ft    Time 8    Period Weeks    Status Achieved    Target Date 05/03/21      PT LONG TERM GOAL #3   Title Pt to demonstrate improved FOTO score by 5 points compared to  initital assessment.    Baseline 9/26: 49 (eval was 50) 911/21: 52 12/28 deferred as wife not present and pt not best historian    Time 8    Period Weeks    Status On-going    Target Date 05/03/21      PT LONG TERM GOAL #4   Title Pt to show tolerance of AMB>1320ft with UpWalker to improve tolerance to accessing the community for IADL.    Baseline Eval: 616ft;  9/26: 1385    Time 12    Period Weeks    Status Achieved    Target Date 05/31/21      PT LONG TERM GOAL #5   Title Patient will increase six minute walk test distance to >1000 for progression to community ambulator and  improve gait ability    Baseline 9/26: new 11/21: 850 feet with up Walker 12/28: 925 feet with up walker    Time 8    Period Weeks    Status On-going    Target Date 06/09/21                   Plan - 04/14/21 1451     Clinical Impression Statement Pt presents to PT for progress note following several bouts of physical therapy.  Patient presents with improved ambulation speed and tolerance evidenced by improved distance with 6-minute walk test during today's session.  Patient does continue to have falls that are common in occasion.  Balance training with in clinic has not made significant improvements in his fall frequency as evidenced by several falls within the past month.  Patient did complete Berg balance test today and is at high risk for falling based on results from [facility] balance test.  Patient will continue to benefit from skilled physical therapy intervention in order to improve his balance and safety in order to reduce his risk for falls in the future.Patient's condition has the potential to improve in response to therapy. Maximum improvement is yet to be obtained. The anticipated improvement is attainable and reasonable in a generally predictable time.       Personal Factors and Comorbidities Age;Past/Current Experience;Time since onset of injury/illness/exacerbation;Transportation    Comorbidities Chronic back pain, orthostatic hypotension, repeated falls, parkinsonism    Examination-Activity Limitations Bathing;Bed Mobility;Caring for Others;Bend;Dressing;Lift;Locomotion Level;Reach Overhead;Sit;Squat;Stairs;Stand;Toileting;Transfers;Carry    Examination-Participation Restrictions Cleaning;Community Activity;Interpersonal Relationship;Shop;Volunteer;Yard Work;Other;Driving    Stability/Clinical Decision Making Evolving/Moderate complexity    Rehab Potential Fair    PT Frequency 2x / week    PT Duration 12 weeks    PT Treatment/Interventions ADLs/Self Care Home  Management;Aquatic Therapy;Cryotherapy;Electrical Stimulation;Iontophoresis 4mg /ml Dexamethasone;Moist Heat;Ultrasound;Contrast Bath;DME Instruction;Gait training;Stair training;Functional mobility training;Therapeutic activities;Therapeutic exercise;Balance training;Neuromuscular re-education;Patient/family education;Manual techniques;Wheelchair mobility training;Energy conservation;Passive range of motion;Traction;Orthotic Fit/Training;Dry needling;Vestibular;Visual/perceptual remediation/compensation;Taping;Canalith Repostioning;Cognitive remediation    PT Next Visit Plan Advanced AMB tolerance, use MiniBesTest to guide balance interventions seated rotation and balance exercises    PT Home Exercise Plan None set up at evaluation, still working on previous HEP from 6 months prior (STS, daily walking); 8/29: issued STS with focus on large amplitude movement at support surface; 9/12: PT provides handout with sit to stand exercise and large amplitude seated trunk rotation exercise.  9/19: reinforced HEP, provided handout for seated postural exercise with UE abduction, and seated exercise with trunk twists and UE reach, 10x for each, can be performed daily or minimum of 4x/week. No changes today.    Consulted and Agree with Plan of Care Patient;Family member/caregiver    Family Member Consulted Wife  Patient will benefit from skilled therapeutic intervention in order to improve the following deficits and impairments:  Abnormal gait, Decreased balance, Decreased coordination, Decreased mobility, Postural dysfunction, Decreased strength, Decreased safety awareness, Improper body mechanics, Impaired flexibility, Decreased activity tolerance, Decreased endurance, Decreased knowledge of precautions, Difficulty walking, Pain, Cardiopulmonary status limiting activity, Decreased range of motion, Impaired perceived functional ability, Decreased cognition, Dizziness  Visit Diagnosis: Other  abnormalities of gait and mobility  Unsteadiness on feet  Muscle weakness (generalized)  Abnormality of gait and mobility     Problem List Patient Active Problem List   Diagnosis Date Noted   Sinus drainage 12/09/2020   Medicare annual wellness visit, subsequent 11/18/2020   Pacemaker    Dementia associated with Parkinson's disease ([place]) 06/17/2020   Vertigo 02/19/2020   Urine abnormality 02/19/2020   Dysphagia 02/23/2019   Fall at home 10/18/2017   REM behavioral disorder 11/02/2016   Radicular pain in right arm 10/09/2016   GERD (gastroesophageal reflux disease) 07/31/2016   Colon cancer screening 07/23/2015   Gout 02/18/2015   Depression 01/06/2015   Skin lesion 10/02/2014   Dupuytren's contracture 08/20/2014   Cough 07/21/2014   Lumbar stenosis with neurogenic claudication 06/13/2014   S/P deep brain stimulator placement 05/08/2014   Advance care planning 01/19/2014   Parkinson's disease ([place]) 12/13/2013   PTSD (post-traumatic stress disorder) 06/13/2013   Erectile dysfunction 06/13/2013   HLD (hyperlipidemia) 06/13/2013   Essential hypertension 06/03/2013   Bradycardia by electrocardiogram 06/03/2013   Obstructive sleep apnea 03/12/2013    Particia Lather, PT 04/14/2021, 3:02 PM  [place] MAIN [facility] SERVICES [address] [place], [place], [address] Phone: [phone]   Fax:  [phone]  Name: [name] MRN: [id number] Date of Birth: Aug 06, 1950

## 2021-04-20 ENCOUNTER — Ambulatory Visit (INDEPENDENT_AMBULATORY_CARE_PROVIDER_SITE_OTHER): Payer: Medicare PPO | Admitting: Psychology

## 2021-04-20 DIAGNOSIS — F331 Major depressive disorder, recurrent, moderate: Secondary | ICD-10-CM | POA: Diagnosis not present

## 2021-04-20 DIAGNOSIS — F4323 Adjustment disorder with mixed anxiety and depressed mood: Secondary | ICD-10-CM | POA: Diagnosis not present

## 2021-04-20 NOTE — Progress Notes (Signed)
[place] Counselor/Therapist Progress Note  Patient ID: CABLE FEARN, MRN: [id number],    Date: 04/20/2021  Time Spent: 30 minutes  Treatment Type: Individual Therapy  Reported Symptoms: sadness, and boredom  Mental Status Exam: Appearance:  Casual     Behavior: Drowsy  Motor: Psychomotor Retardation  Speech/Language:  Slow  Affect: Blunt  Mood: normal  Thought process: concrete  Thought content:   WNL  Sensory/Perceptual disturbances:   WNL  Orientation: oriented to person, place, time/date, and situation  Attention: Fair  Concentration: Fair  Memory: Immediate;   [place] of knowledge:  Fair  Insight:   Fair  Judgment:  Fair  Impulse Control: Fair   Risk Assessment: Danger to Self:  No Self-injurious Behavior: No Danger to Others: No Duty to Warn:no Physical Aggression / Violence:No  Access to Firearms a concern: No  Gang Involvement:No   Subjective: The patient attended a face-to-face individual therapy session via video visit today.  The patient gave verbal consent for the video to be on WebEx.  The patient was in his home alone and therapist was in the office.  The patient continues to do well with coping for the most part.  The patient talked about being frustrated with falling down.  I made a recommendation to talk with his physical therapist about this and see if there is some problem solving that they could do.  In addition the patient says that he is hallucinating more throughout the day.  I recommended that he speak with his wife about this and she can talk to his neurologist and see if there is something they can do to help with this.  The patient's depression seems to be stabilized and he seems to be managing things okay however he is frustrated with his illness.  We will continue to see patient and offer supportive therapy.  Interventions: Ego-Supportive  Diagnosis:Major depressive disorder, recurrent episode, moderate (HCC)  Adjustment  disorder with mixed anxiety and depressed mood  Plan: Please see Treatment plan in Therapy Charts with target date of 04/30/2021 for goals and progress towards goals.  Plan is to continue to offer supportive therapy to patient to help him deal with his depression related to his Parkinsons diagnosis.  Patient approved Treatment Plan in Therapy Charts.  Usiel Astarita G Emilo Gras, LCSW

## 2021-04-21 ENCOUNTER — Ambulatory Visit: Payer: Medicare PPO | Admitting: Physical Therapy

## 2021-04-21 ENCOUNTER — Encounter: Payer: Medicare PPO | Admitting: Speech Pathology

## 2021-04-22 ENCOUNTER — Ambulatory Visit: Payer: Medicare PPO | Attending: Family Medicine

## 2021-04-22 ENCOUNTER — Other Ambulatory Visit: Payer: Self-pay

## 2021-04-22 DIAGNOSIS — R278 Other lack of coordination: Secondary | ICD-10-CM | POA: Diagnosis not present

## 2021-04-22 DIAGNOSIS — R262 Difficulty in walking, not elsewhere classified: Secondary | ICD-10-CM | POA: Diagnosis not present

## 2021-04-22 DIAGNOSIS — R269 Unspecified abnormalities of gait and mobility: Secondary | ICD-10-CM | POA: Insufficient documentation

## 2021-04-22 DIAGNOSIS — M6281 Muscle weakness (generalized): Secondary | ICD-10-CM | POA: Insufficient documentation

## 2021-04-22 DIAGNOSIS — G20 Parkinson's disease: Secondary | ICD-10-CM | POA: Insufficient documentation

## 2021-04-22 DIAGNOSIS — R296 Repeated falls: Secondary | ICD-10-CM | POA: Insufficient documentation

## 2021-04-22 DIAGNOSIS — R2681 Unsteadiness on feet: Secondary | ICD-10-CM | POA: Diagnosis not present

## 2021-04-22 NOTE — Therapy (Signed)
[place] MAIN [facility] SERVICES [address] [place], [place], [address] Phone: [phone]   Fax:  [phone]  Physical Therapy Treatment  Patient Details  Name: [name] MRN: [id number] Date of Birth: 1950-04-28 Referring Provider (PT): Elsie Stain, MD   Encounter Date: 04/22/2021   PT End of Session - 04/22/21 1710     Visit Number 31    Number of Visits 74    Date for PT Re-Evaluation 05/31/21    Authorization Type [employer] PPO    Authorization Time Period recert for 2/35-36/14    Progress Note Due on Visit 30    PT Start Time 1430    PT Stop Time 1514    PT Time Calculation (min) 44 min    Equipment Utilized During Treatment Gait belt    Activity Tolerance Patient tolerated treatment well    Behavior During Therapy [facility] for tasks assessed/performed             Past Medical History:  Diagnosis Date   Arthritis    Bradycardia    Cancer ([place]) 2013   skin cancer   Depression    ptsd   Dysrhythmia    chronic slow heart rate   GERD (gastroesophageal reflux disease)    Headache(784.0)    tension headaches non recent   History of chicken pox    History of kidney stones    passed   Hypertension    treated with HCTZ   Pacemaker    Parkinson's disease ([place])    dx'ed 15 years ago   PTSD (post-traumatic stress disorder)    Shortness of breath dyspnea    Sleep apnea    doesn't use C-pap   Varicose veins     Past Surgical History:  Procedure Laterality Date   CHOLECYSTECTOMY N/A 10/22/2014   Procedure: LAPAROSCOPIC CHOLECYSTECTOMY WITH INTRAOPERATIVE CHOLANGIOGRAM;  Surgeon: Dia Crawford III, MD;  Location: [facility] ORS;  Service: General;  Laterality: N/A;   cyst removed      from lip as a child   INTRAMEDULLARY (IM) NAIL INTERTROCHANTERIC N/A 02/18/2019   Procedure: INTRAMEDULLARY (IM) NAIL INTERTROCHANTRIC, RIGHT,;  Surgeon: Thornton Park, MD;  Location: [facility] ORS;  Service: Orthopedics;  Laterality: N/A;    LUMBAR LAMINECTOMY/DECOMPRESSION MICRODISCECTOMY Bilateral 12/14/2012   Procedure: Bilateral lumbar three-four, four-five decompressive laminotomy/foraminotomy;  Surgeon: Charlie Pitter, MD;  Location: [place] [facility];  Service: Neurosurgery;  Laterality: Bilateral;   PULSE GENERATOR IMPLANT Bilateral 12/13/2013   Procedure: Bilateral implantable pulse generator placement;  Surgeon: Erline Levine, MD;  Location: [place] [facility] ORS;  Service: Neurosurgery;  Laterality: Bilateral;  Bilateral implantable pulse generator placement   skin cancer removed     from ears,   12 lft arm  rt leg 15   SUBTHALAMIC STIMULATOR BATTERY REPLACEMENT Bilateral 07/14/2017   Procedure: BILATERAL IMPLANTED PULSE GENERATOR CHANGE FOR DEEP BRAIN STIMULATOR;  Surgeon: Erline Levine, MD;  Location: [place];  Service: Neurosurgery;  Laterality: Bilateral;   SUBTHALAMIC STIMULATOR INSERTION Bilateral 12/06/2013   Procedure: SUBTHALAMIC STIMULATOR INSERTION;  Surgeon: Erline Levine, MD;  Location: [place] [facility] OR[facility];  Service: Neurosurgery;  Laterality: Bilateral;  Bilateral deep brain stimulator placement    There were no vitals filed for this visit.   Subjective Assessment - 04/22/21 1436     Subjective Patient denies any falls since last session    Patient is accompained by: Family member    Pertinent History [name] is a 55yoM who comes to [facility] for evaluation of conitnued difficulty of imbalance, unsteadiness on feet, and generally limited tolerance to mobility in the setting of chronic parkinsonism. SInce DC in February, pt has had PPM placement due to bradycardia issues. Pt also started using an UpWalker device 2 week prior to this evaluation, goal was to help with gradual melting of postural extension that occured with sustained upright actiivty. Pt has continued to practice supervised AMB with caregivers, short distances out of house and in community. Pt continues to experience frequent falls on a weekly basis, at a rate that  both he and wife report to be increasing in frequency. PMH: HTN, bradycardia s/p PPM, PD, DBS, COVID-19 infection, chronic low back pain s/p L4/5 surgery.    Limitations Walking;Standing;Sitting;House hold activities;Lifting    How long can you sit comfortably? Not limited    How long can you stand comfortably? 5 minutes with support    How long can you walk comfortably? ~5-6 minutes UpWalker Walker    Patient Stated Goals Reduce falls, improve AMB tolerance    Currently in Pain? Yes    Pain Score 4     Pain Location Back    Pain Orientation Lower    Pain Descriptors / Indicators Aching    Pain Type Chronic pain    Pain Onset More than a month ago    Pain Onset More than a month ago               INTERVENTIONS:   Neuromuscular re-education:  all balance exercises performed in corner with CGA using gait belt unless otherwise noted.   -Static stand in corner - feet wide- eyes opened then closed x 20 sec each  (increased A/P sway with eyes closed yet no LOB).   -Static stand in corner - feet narrowed- eyes opened then closed x 20 sec x 3 sets each  (Again- increased A/P sway with eyes closed yet no LOB).   -Static stand in corner- Feet staggered- eyes open then closed x 20 sec x 3 each- Increased difficulty with eyes closed with intermittent lateral LOB   Sit to stand- VC for technique as PT noticed patient not reaching back with UE when sitting. He was able to perform well x 10 reps including appropriate reaching with VC.    360 deg turning- X 3 sets in corner- without UE Support - able to turn in a tight space without extraneous steps and no LOB.   360 turning with use of upright walker- 10 sec total for turning to left and 10 sec to right without LOB or issues moving his feet.     Therapeutic Exercises:   -Lumbar stretch with ball (seated) - leaning forward pushing green theraball forward x 15 reps.  -Seated HS stretch- Hold 20 sec x 2 sets each LE- (VC and visual demo  for correct technique)    Education provided throughout session via VC/TC and demonstration to facilitate movement at target joints and correct muscle activation for all testing and exercises performed.         PT Education - 04/22/21 1711     Education provided Yes    Education Details Exercise technique    Person(s) Educated Patient;Caregiver(s)    Methods Explanation;Demonstration;Tactile cues;Verbal cues    Comprehension Verbalized understanding;Returned demonstration;Verbal cues required;Tactile cues required;Need further instruction              PT Short Term Goals - 04/14/21 1120       PT SHORT TERM GOAL #  1   Title Pt to demonstrate improved sustained AMB tolerance >867ft.    Baseline At evlauation: 669ft tolerance (not to failure); 9/26: 1384 ft    Time 4    Period Weeks    Status Achieved    Target Date 01/05/21      PT SHORT TERM GOAL #2   Title Pt will improve BERG balance score to greater than 45 to indicate decrease risk of falls.    Baseline Eval: 15/28; 9/26: deferred, 03/08/21: 15/28    Time 4    Period Weeks    Status On-going    Target Date 05/12/21      PT SHORT TERM GOAL #3   Title Pt to demonstrate 10MWT<13sec to show self-selected gait speed appropriate for AMB outside of house and to improve energy efficiency.    Baseline UpWalker Eval: 17.53sec; 9/26: 0.74 m/s (13.5)11/21: 13.68 (.73 m/s) 12.64 on 12/28    Time 4    Period Weeks    Status Achieved    Target Date 02/08/21               PT Long Term Goals - 04/14/21 1458       PT LONG TERM GOAL #1   Title Pt will improve BERG balance score to greater than 47 to indicate decreaed risk of falls.    Baseline 41 on 12/28    Time 8    Period Weeks    Status On-going    Target Date 05/31/21      PT LONG TERM GOAL #2   Title Pt to tolerate 1086ft sustained AMB with UpWalker without rest interval to improve safety and independence in limited community AMB.    Baseline At eval;  tired after 612ft; 9/26: 1384 ft    Time 8    Period Weeks    Status Achieved    Target Date 05/03/21      PT LONG TERM GOAL #3   Title Pt to demonstrate improved FOTO score by 5 points compared to initital assessment.    Baseline 9/26: 49 (eval was 50) 911/21: 52 12/28 deferred as wife not present and pt not best historian    Time 8    Period Weeks    Status On-going    Target Date 05/03/21      PT LONG TERM GOAL #4   Title Pt to show tolerance of AMB>1356ft with UpWalker to improve tolerance to accessing the community for IADL.    Baseline Eval: 645ft;  9/26: 1385    Time 12    Period Weeks    Status Achieved    Target Date 05/31/21      PT LONG TERM GOAL #5   Title Patient will increase six minute walk test distance to >1000 for progression to community ambulator and improve gait ability    Baseline 9/26: new 11/21: 850 feet with up Walker 12/28: 925 feet with up walker    Time 8    Period Weeks    Status On-going    Target Date 06/09/21                   Plan - 04/22/21 1538     Clinical Impression Statement Patient presents with good motivation for today's session. Today's treatment focused on balance with ongoing fall history. Patient required mostly CGA and Min to mod verbal cues used throughout with increased in postural sway and LOB most seen with narrow base of support and eyes closed. Continues  to have balance deficits typical with diagnosis. Patient performs beginner level exercises without report of pain yet does require verbal cuing for postural alignment. He did demonstrate some overall weakness with mild left knee buckling with standing. He also responded well to transfer cues including VC for hand position for safe sit to stand transfer today. Patient will continue to benefit from skilled physical therapy intervention in order to improve his balance and safety in order to reduce his risk for falls in the future    Personal Factors and Comorbidities  Age;Past/Current Experience;Time since onset of injury/illness/exacerbation;Transportation    Comorbidities Chronic back pain, orthostatic hypotension, repeated falls, parkinsonism    Examination-Activity Limitations Bathing;Bed Mobility;Caring for Others;Bend;Dressing;Lift;Locomotion Level;Reach Overhead;Sit;Squat;Stairs;Stand;Toileting;Transfers;Carry    Examination-Participation Restrictions Cleaning;Community Activity;Interpersonal Relationship;Shop;Volunteer;Yard Work;Other;Driving    Stability/Clinical Decision Making Evolving/Moderate complexity    Rehab Potential Fair    PT Frequency 2x / week    PT Duration 12 weeks    PT Treatment/Interventions ADLs/Self Care Home Management;Aquatic Therapy;Cryotherapy;Electrical Stimulation;Iontophoresis 4mg /ml Dexamethasone;Moist Heat;Ultrasound;Contrast Bath;DME Instruction;Gait training;Stair training;Functional mobility training;Therapeutic activities;Therapeutic exercise;Balance training;Neuromuscular re-education;Patient/family education;Manual techniques;Wheelchair mobility training;Energy conservation;Passive range of motion;Traction;Orthotic Fit/Training;Dry needling;Vestibular;Visual/perceptual remediation/compensation;Taping;Canalith Repostioning;Cognitive remediation    PT Next Visit Plan Progress static and dynamic balance and continue with caregiver/patient ed with low back stretching    PT Home Exercise Plan None set up at evaluation, still working on previous HEP from 6 months prior (STS, daily walking); 8/29: issued STS with focus on large amplitude movement at support surface; 9/12: PT provides handout with sit to stand exercise and large amplitude seated trunk rotation exercise.  9/19: reinforced HEP, provided handout for seated postural exercise with UE abduction, and seated exercise with trunk twists and UE reach, 10x for each, can be performed daily or minimum of 4x/week. No changes today.    Consulted and Agree with Plan of Care  Patient;Family member/caregiver    Family Member Consulted Wife             Patient will benefit from skilled therapeutic intervention in order to improve the following deficits and impairments:  Abnormal gait, Decreased balance, Decreased coordination, Decreased mobility, Postural dysfunction, Decreased strength, Decreased safety awareness, Improper body mechanics, Impaired flexibility, Decreased activity tolerance, Decreased endurance, Decreased knowledge of precautions, Difficulty walking, Pain, Cardiopulmonary status limiting activity, Decreased range of motion, Impaired perceived functional ability, Decreased cognition, Dizziness  Visit Diagnosis: Abnormality of gait and mobility  Difficulty in walking, not elsewhere classified  Muscle weakness (generalized)  Unsteadiness on feet     Problem List Patient Active Problem List   Diagnosis Date Noted   Sinus drainage 12/09/2020   Medicare annual wellness visit, subsequent 11/18/2020   Pacemaker    Dementia associated with Parkinson's disease ([place]) 06/17/2020   Vertigo 02/19/2020   Urine abnormality 02/19/2020   Dysphagia 02/23/2019   Fall at home 10/18/2017   REM behavioral disorder 11/02/2016   Radicular pain in right arm 10/09/2016   GERD (gastroesophageal reflux disease) 07/31/2016   Colon cancer screening 07/23/2015   Gout 02/18/2015   Depression 01/06/2015   Skin lesion 10/02/2014   Dupuytren's contracture 08/20/2014   Cough 07/21/2014   Lumbar stenosis with neurogenic claudication 06/13/2014   S/P deep brain stimulator placement 05/08/2014   Advance care planning 01/19/2014   Parkinson's disease ([place]) 12/13/2013   PTSD (post-traumatic stress disorder) 06/13/2013   Erectile dysfunction 06/13/2013   HLD (hyperlipidemia) 06/13/2013   Essential hypertension 06/03/2013   Bradycardia by electrocardiogram 06/03/2013   Obstructive sleep apnea 03/12/2013    Dellis Filbert  Jonna Coup, PT 04/22/2021, 5:12 PM  [place] MAIN [facility] SERVICES [address] [place], [place], [address] Phone: [phone]   Fax:  [phone]  Name: [name] MRN: [id number] Date of Birth: 1950/07/20

## 2021-04-26 ENCOUNTER — Ambulatory Visit: Payer: Medicare PPO | Admitting: Physical Therapy

## 2021-04-26 ENCOUNTER — Encounter: Payer: Medicare PPO | Admitting: Speech Pathology

## 2021-04-28 ENCOUNTER — Ambulatory Visit: Payer: Medicare PPO | Admitting: Physical Therapy

## 2021-04-28 ENCOUNTER — Encounter: Payer: Medicare PPO | Admitting: Speech Pathology

## 2021-04-28 ENCOUNTER — Other Ambulatory Visit: Payer: Self-pay

## 2021-04-28 DIAGNOSIS — G20 Parkinson's disease: Secondary | ICD-10-CM | POA: Diagnosis not present

## 2021-04-28 DIAGNOSIS — R278 Other lack of coordination: Secondary | ICD-10-CM | POA: Diagnosis not present

## 2021-04-28 DIAGNOSIS — R262 Difficulty in walking, not elsewhere classified: Secondary | ICD-10-CM

## 2021-04-28 DIAGNOSIS — M6281 Muscle weakness (generalized): Secondary | ICD-10-CM | POA: Diagnosis not present

## 2021-04-28 DIAGNOSIS — R2681 Unsteadiness on feet: Secondary | ICD-10-CM

## 2021-04-28 DIAGNOSIS — R296 Repeated falls: Secondary | ICD-10-CM | POA: Diagnosis not present

## 2021-04-28 DIAGNOSIS — R269 Unspecified abnormalities of gait and mobility: Secondary | ICD-10-CM | POA: Diagnosis not present

## 2021-04-28 NOTE — Therapy (Signed)
[place] MAIN [facility] SERVICES [address] [place], [place], [address] Phone: [phone]   Fax:  [phone]  Physical Therapy Treatment  Patient Details  Name: [name] MRN: [id number] Date of Birth: [birth year] Referring Provider (PT): Elsie Stain, MD   Encounter Date: 04/28/2021   PT End of Session - 04/28/21 1105     Visit Number 32    Number of Visits 48    Date for PT Re-Evaluation 05/31/21    Authorization Type [employer] PPO    Authorization Time Period recert for 5/70-17/79    Progress Note Due on Visit 40    PT Start Time 1055    PT Stop Time 1140    PT Time Calculation (min) 45 min    Equipment Utilized During Treatment Gait belt    Activity Tolerance Patient tolerated treatment well    Behavior During Therapy WFL for tasks assessed/performed             Past Medical History:  Diagnosis Date   Arthritis    Bradycardia    Cancer ([place]) 2013   skin cancer   Depression    ptsd   Dysrhythmia    chronic slow heart rate   GERD (gastroesophageal reflux disease)    Headache(784.0)    tension headaches non recent   History of chicken pox    History of kidney stones    passed   Hypertension    treated with HCTZ   Pacemaker    Parkinson's disease ([place])    dx'ed 15 years ago   PTSD (post-traumatic stress disorder)    Shortness of breath dyspnea    Sleep apnea    doesn't use C-pap   Varicose veins     Past Surgical History:  Procedure Laterality Date   CHOLECYSTECTOMY N/A 10/22/2014   Procedure: LAPAROSCOPIC CHOLECYSTECTOMY WITH INTRAOPERATIVE CHOLANGIOGRAM;  Surgeon: Dia Crawford III, MD;  Location: [facility] ORS;  Service: General;  Laterality: N/A;   cyst removed      from lip as a child   INTRAMEDULLARY (IM) NAIL INTERTROCHANTERIC N/A 02/18/2019   Procedure: INTRAMEDULLARY (IM) NAIL INTERTROCHANTRIC, RIGHT,;  Surgeon: Thornton Park, MD;  Location: [facility] ORS;  Service: Orthopedics;  Laterality:  N/A;   LUMBAR LAMINECTOMY/DECOMPRESSION MICRODISCECTOMY Bilateral 12/14/2012   Procedure: Bilateral lumbar three-four, four-five decompressive laminotomy/foraminotomy;  Surgeon: Charlie Pitter, MD;  Location: [place] [facility];  Service: Neurosurgery;  Laterality: Bilateral;   PULSE GENERATOR IMPLANT Bilateral 12/13/2013   Procedure: Bilateral implantable pulse generator placement;  Surgeon: Erline Levine, MD;  Location: [place] [facility];  Service: Neurosurgery;  Laterality: Bilateral;  Bilateral implantable pulse generator placement   skin cancer removed     from ears,   12 lft arm  rt leg 15   SUBTHALAMIC STIMULATOR BATTERY REPLACEMENT Bilateral 07/14/2017   Procedure: BILATERAL IMPLANTED PULSE GENERATOR CHANGE FOR DEEP BRAIN STIMULATOR;  Surgeon: Erline Levine, MD;  Location: [place];  Service: Neurosurgery;  Laterality: Bilateral;   SUBTHALAMIC STIMULATOR INSERTION Bilateral 12/06/2013   Procedure: SUBTHALAMIC STIMULATOR INSERTION;  Surgeon: Erline Levine, MD;  Location: [place] [facility];  Service: Neurosurgery;  Laterality: Bilateral;  Bilateral deep brain stimulator placement    There were no vitals filed for this visit.   Subjective Assessment - 04/28/21 1100     Subjective Pt reports he did have a fall since his last session. Rpeorts he is not sure how it happended but he is having some shoulder pain following. He reports he thinks  he was transitioning form upwalker to bed and fell between the two. he also reports the shoulder pain has been present since before the fall but it has just been worse since the fall.    Patient is accompained by: Family member    Pertinent History [name] is a 73yoM who comes to [facility] neuro for evaluation of conitnued difficulty of imbalance, unsteadiness on feet, and generally limited tolerance to mobility in the setting of chronic parkinsonism. SInce DC in February, pt has had PPM placement due to bradycardia issues. Pt also started using an UpWalker device 2 week prior to  this evaluation, goal was to help with gradual melting of postural extension that occured with sustained upright actiivty. Pt has continued to practice supervised AMB with caregivers, short distances out of house and in community. Pt continues to experience frequent falls on a weekly basis, at a rate that both he and wife report to be increasing in frequency. PMH: HTN, bradycardia s/p PPM, PD, DBS, COVID-19 infection, chronic low back pain s/p L4/5 surgery.    Limitations Walking;Standing;Sitting;House hold activities;Lifting    How long can you sit comfortably? Not limited    How long can you stand comfortably? 5 minutes with support    How long can you walk comfortably? ~5-6 minutes UpWalker Walker    Patient Stated Goals Reduce falls, improve AMB tolerance    Pain Onset More than a month ago    Pain Onset More than a month ago             INTERVENTIONS:   Neuromuscular re-education: Unless otherwise stated, CGA was provided and gait belt donned in order to ensure pt safety The following activities were completed in parallel bars or at balance bar     -Static stand in corner - feet narrowed- eyes opened then closed x 45 sec x 1 sets each  (Again- increased A/P sway with eyes closed yet no LOB).   -Static stand in corner- Feet staggered- eyes open then closed x 20 sec x 3 each- Increased difficulty with eyes closed with intermittent lateral LOB  Obstacle course in // bars x 3 laps  - hurdle, airex and 1/2 foam roller  - good sequencing, B UE support  There ACT  Sit to stand- Good technique with less need for VC this session, good knee extension upon standing compared to previous sessions.   - 2 x 10 reps     Ladder step overs in // bars  - B HH support  -2 x 3 laps  -turn around in ladder box without UE support to improve transitions without UE support  - difficulty with sequencing between using hands with walking portion and not using hands with turning around        Therapeutic Exercises:   -Lumbar stretch with ball (seated) - leaning forward and to each side  pushing green theraball forward x 10 reps each direction(  -Seated HS stretch- Hold 45 sec x 2 sets each LE- (VC and visual demo for correct technique)                           PT Education - 04/28/21 1103     Education provided Yes    Education Details exercise technique and fall prevention    Person(s) Educated Patient    Methods Explanation    Comprehension Verbalized understanding;Need further instruction  PT Short Term Goals - 04/14/21 1120       PT SHORT TERM GOAL #1   Title Pt to demonstrate improved sustained AMB tolerance >875ft.    Baseline At evlauation: 621ft tolerance (not to failure); 9/26: 1384 ft    Time 4    Period Weeks    Status Achieved    Target Date 01/05/21      PT SHORT TERM GOAL #2   Title Pt will improve BERG balance score to greater than 45 to indicate decrease risk of falls.    Baseline Eval: 15/28; 9/26: deferred, 03/08/21: 15/28    Time 4    Period Weeks    Status On-going    Target Date 05/12/21      PT SHORT TERM GOAL #3   Title Pt to demonstrate 10MWT<13sec to show self-selected gait speed appropriate for AMB outside of house and to improve energy efficiency.    Baseline UpWalker Eval: 17.53sec; 9/26: 0.74 m/s (13.5)11/21: 13.68 (.73 m/s) 12.64 on 12/28    Time 4    Period Weeks    Status Achieved    Target Date 02/08/21               PT Long Term Goals - 04/14/21 1458       PT LONG TERM GOAL #1   Title Pt will improve BERG balance score to greater than 47 to indicate decreaed risk of falls.    Baseline 41 on 12/28    Time 8    Period Weeks    Status On-going    Target Date 05/31/21      PT LONG TERM GOAL #2   Title Pt to tolerate 1042ft sustained AMB with UpWalker without rest interval to improve safety and independence in limited community AMB.    Baseline At eval; tired  after 667ft; 9/26: 1384 ft    Time 8    Period Weeks    Status Achieved    Target Date 05/03/21      PT LONG TERM GOAL #3   Title Pt to demonstrate improved FOTO score by 5 points compared to initital assessment.    Baseline 9/26: 49 (eval was 50) 911/21: 52 12/28 deferred as wife not present and pt not best historian    Time 8    Period Weeks    Status On-going    Target Date 05/03/21      PT LONG TERM GOAL #4   Title Pt to show tolerance of AMB>1317ft with UpWalker to improve tolerance to accessing the community for IADL.    Baseline Eval: 685ft;  9/26: 1385    Time 12    Period Weeks    Status Achieved    Target Date 05/31/21      PT LONG TERM GOAL #5   Title Patient will increase six minute walk test distance to >1000 for progression to community ambulator and improve gait ability    Baseline 9/26: new 11/21: 850 feet with up Walker 12/28: 925 feet with up walker    Time 8    Period Weeks    Status On-going    Target Date 06/09/21                   Plan - 04/28/21 1106     Clinical Impression Statement Patient presents with excellent motivation for completion of physical therapy program.  Patient able to tolerate several standing activities working on static and dynamic balance in today's session.  Patient  is continuing to have falls within his home patient has difficulty recalling specific causes of his falls creating a significant barrier to preventing these falls.  Patient further educated regarding proper use of upper walker patient tends to perk up walker and attempt to step over it too interesting to various surfaces this continues to be a source of several of his falls.  Despite several instructions and practice patient does difficulty remembering the sequencing of this task.  Patient will continue to benefit from skilled physical therapy intervention in order to improve his lower extremity strength, improve balance, prevent falls, and improve his overall quality  of life.    Personal Factors and Comorbidities Age;Past/Current Experience;Time since onset of injury/illness/exacerbation;Transportation    Comorbidities Chronic back pain, orthostatic hypotension, repeated falls, parkinsonism    Examination-Activity Limitations Bathing;Bed Mobility;Caring for Others;Bend;Dressing;Lift;Locomotion Level;Reach Overhead;Sit;Squat;Stairs;Stand;Toileting;Transfers;Carry    Examination-Participation Restrictions Cleaning;Community Activity;Interpersonal Relationship;Shop;Volunteer;Yard Work;Other;Driving    Stability/Clinical Decision Making Evolving/Moderate complexity    Rehab Potential Fair    PT Frequency 2x / week    PT Duration 12 weeks    PT Treatment/Interventions ADLs/Self Care Home Management;Aquatic Therapy;Cryotherapy;Electrical Stimulation;Iontophoresis 4mg /ml Dexamethasone;Moist Heat;Ultrasound;Contrast Bath;DME Instruction;Gait training;Stair training;Functional mobility training;Therapeutic activities;Therapeutic exercise;Balance training;Neuromuscular re-education;Patient/family education;Manual techniques;Wheelchair mobility training;Energy conservation;Passive range of motion;Traction;Orthotic Fit/Training;Dry needling;Vestibular;Visual/perceptual remediation/compensation;Taping;Canalith Repostioning;Cognitive remediation    PT Next Visit Plan Progress static and dynamic balance and continue with caregiver/patient ed with low back stretching    PT Home Exercise Plan None set up at evaluation, still working on previous HEP from 6 months prior (STS, daily walking); 8/29: issued STS with focus on large amplitude movement at support surface; 9/12: PT provides handout with sit to stand exercise and large amplitude seated trunk rotation exercise.  9/19: reinforced HEP, provided handout for seated postural exercise with UE abduction, and seated exercise with trunk twists and UE reach, 10x for each, can be performed daily or minimum of 4x/week. No changes today.     Consulted and Agree with Plan of Care Patient;Family member/caregiver    Family Member Consulted Wife             Patient will benefit from skilled therapeutic intervention in order to improve the following deficits and impairments:  Abnormal gait, Decreased balance, Decreased coordination, Decreased mobility, Postural dysfunction, Decreased strength, Decreased safety awareness, Improper body mechanics, Impaired flexibility, Decreased activity tolerance, Decreased endurance, Decreased knowledge of precautions, Difficulty walking, Pain, Cardiopulmonary status limiting activity, Decreased range of motion, Impaired perceived functional ability, Decreased cognition, Dizziness  Visit Diagnosis: Abnormality of gait and mobility  Difficulty in walking, not elsewhere classified  Muscle weakness (generalized)  Unsteadiness on feet     Problem List Patient Active Problem List   Diagnosis Date Noted   Sinus drainage 12/09/2020   Medicare annual wellness visit, subsequent 11/18/2020   Pacemaker    Dementia associated with Parkinson's disease ([place]) 06/17/2020   Vertigo 02/19/2020   Urine abnormality 02/19/2020   Dysphagia 02/23/2019   Fall at home 10/18/2017   REM behavioral disorder 11/02/2016   Radicular pain in right arm 10/09/2016   GERD (gastroesophageal reflux disease) 07/31/2016   Colon cancer screening 07/23/2015   Gout 02/18/2015   Depression 01/06/2015   Skin lesion 10/02/2014   Dupuytren's contracture 08/20/2014   Cough 07/21/2014   Lumbar stenosis with neurogenic claudication 06/13/2014   S/P deep brain stimulator placement 05/08/2014   Advance care planning 01/19/2014   Parkinson's disease ([place]) 12/13/2013   PTSD (post-traumatic stress disorder) 06/13/2013   Erectile dysfunction 06/13/2013   HLD (  hyperlipidemia) 06/13/2013   Essential hypertension 06/03/2013   Bradycardia by electrocardiogram 06/03/2013   Obstructive sleep apnea 03/12/2013    Particia Lather,  PT 04/28/2021, 5:12 PM  [place] MAIN [facility] SERVICES [address] [place], [place], [address] Phone: [phone]   Fax:  [phone]  Name: [name] MRN: [id number] Date of Birth: 1950/06/03

## 2021-05-03 ENCOUNTER — Other Ambulatory Visit: Payer: Self-pay

## 2021-05-03 ENCOUNTER — Ambulatory Visit: Payer: Medicare PPO | Admitting: Physical Therapy

## 2021-05-03 ENCOUNTER — Encounter: Payer: Medicare PPO | Admitting: Speech Pathology

## 2021-05-03 DIAGNOSIS — G20 Parkinson's disease: Secondary | ICD-10-CM | POA: Diagnosis not present

## 2021-05-03 DIAGNOSIS — R296 Repeated falls: Secondary | ICD-10-CM | POA: Diagnosis not present

## 2021-05-03 DIAGNOSIS — R2681 Unsteadiness on feet: Secondary | ICD-10-CM | POA: Diagnosis not present

## 2021-05-03 DIAGNOSIS — M6281 Muscle weakness (generalized): Secondary | ICD-10-CM | POA: Diagnosis not present

## 2021-05-03 DIAGNOSIS — R269 Unspecified abnormalities of gait and mobility: Secondary | ICD-10-CM | POA: Diagnosis not present

## 2021-05-03 DIAGNOSIS — R278 Other lack of coordination: Secondary | ICD-10-CM | POA: Diagnosis not present

## 2021-05-03 DIAGNOSIS — R262 Difficulty in walking, not elsewhere classified: Secondary | ICD-10-CM

## 2021-05-03 NOTE — Therapy (Signed)
[place] [facility] [address] [place], [place], [address] Phone: [phone]   Fax:  [phone]  Physical Therapy Treatment  Patient Details  Name: [name] MRN: [id number] Date of Birth: 01-18-1951 Referring Provider (PT): Elsie Stain, MD   Encounter Date: 05/03/2021   PT End of Session - 05/03/21 1109     Visit Number 33    Number of Visits 48    Date for PT Re-Evaluation 05/31/21    Authorization Type [employer] Choice PPO    Authorization Time Period recert for 6/71-24/58    Progress Note Due on Visit 40    PT Start Time 1100    PT Stop Time 1144    PT Time Calculation (min) 44 min    Equipment Utilized During Treatment Gait belt    Activity Tolerance Patient tolerated treatment well    Behavior During Therapy WFL for tasks assessed/performed             Past Medical History:  Diagnosis Date   Arthritis    Bradycardia    Cancer ([place]) 2013   skin cancer   Depression    ptsd   Dysrhythmia    chronic slow heart rate   GERD (gastroesophageal reflux disease)    Headache(784.0)    tension headaches non recent   History of chicken pox    History of kidney stones    passed   Hypertension    treated with HCTZ   Pacemaker    Parkinson's disease ([place])    dx'ed 15 years ago   PTSD (post-traumatic stress disorder)    Shortness of breath dyspnea    Sleep apnea    doesn't use C-pap   Varicose veins     Past Surgical History:  Procedure Laterality Date   CHOLECYSTECTOMY N/A 10/22/2014   Procedure: LAPAROSCOPIC CHOLECYSTECTOMY WITH INTRAOPERATIVE CHOLANGIOGRAM;  Surgeon: Dia Crawford III, MD;  Location: [facility] ORS;  Service: General;  Laterality: N/A;   cyst removed      from lip as a child   INTRAMEDULLARY (IM) NAIL INTERTROCHANTERIC N/A 02/18/2019   Procedure: INTRAMEDULLARY (IM) NAIL INTERTROCHANTRIC, RIGHT,;  Surgeon: Thornton Park, MD;  Location: [facility] ORS;  Service: Orthopedics;  Laterality:  N/A;   LUMBAR LAMINECTOMY/DECOMPRESSION MICRODISCECTOMY Bilateral 12/14/2012   Procedure: Bilateral lumbar three-four, four-five decompressive laminotomy/foraminotomy;  Surgeon: Charlie Pitter, MD;  Location: [place] [facility] ORS;  Service: Neurosurgery;  Laterality: Bilateral;   PULSE GENERATOR IMPLANT Bilateral 12/13/2013   Procedure: Bilateral implantable pulse generator placement;  Surgeon: Erline Levine, MD;  Location: [place] [facility];  Service: Neurosurgery;  Laterality: Bilateral;  Bilateral implantable pulse generator placement   skin cancer removed     from ears,   12 lft arm  rt leg 15   SUBTHALAMIC STIMULATOR BATTERY REPLACEMENT Bilateral 07/14/2017   Procedure: BILATERAL IMPLANTED PULSE GENERATOR CHANGE FOR DEEP BRAIN STIMULATOR;  Surgeon: Erline Levine, MD;  Location: [place];  Service: Neurosurgery;  Laterality: Bilateral;   SUBTHALAMIC STIMULATOR INSERTION Bilateral 12/06/2013   Procedure: SUBTHALAMIC STIMULATOR INSERTION;  Surgeon: Erline Levine, MD;  Location: [place] [facility] ORS;  Service: Neurosurgery;  Laterality: Bilateral;  Bilateral deep brain stimulator placement    There were no vitals filed for this visit.   Subjective Assessment - 05/03/21 1104     Subjective Pt reports no new falls or LOB since last session. Reports he is going to [place] this week and next week.    Patient is accompained by: Family member  Pertinent History [name] is a 76yoM who comes to [facility] neuro for evaluation of conitnued difficulty of imbalance, unsteadiness on feet, and generally limited tolerance to mobility in the setting of chronic parkinsonism. SInce DC in February, pt has had PPM placement due to bradycardia issues. Pt also started using an UpWalker device 2 week prior to this evaluation, goal was to help with gradual melting of postural extension that occured with sustained upright actiivty. Pt has continued to practice supervised AMB with caregivers, short distances out of house and in community. Pt continues  to experience frequent falls on a weekly basis, at a rate that both he and wife report to be increasing in frequency. PMH: HTN, bradycardia s/p PPM, PD, DBS, COVID-19 infection, chronic low back pain s/p L4/5 surgery.    Limitations Walking;Standing;Sitting;House hold activities;Lifting    How long can you sit comfortably? Not limited    How long can you stand comfortably? 5 minutes with support    How long can you walk comfortably? ~5-6 minutes UpWalker Walker    Patient Stated Goals Reduce falls, improve AMB tolerance    Pain Onset More than a month ago    Pain Onset More than a month ago              INTERVENTIONS:   Neuromuscular re-education: Unless otherwise stated, CGA was provided and gait belt donned in order to ensure pt safety The following activities were completed in parallel bars or at balance bar    Static stance in  bars  - 1 x 45 sec staggered stance, 1 x 45 sec ea tandem stance, no LOB with staggered, a few instances of correcting LOB with UE with tandem stance   Obstacle course in // bars x 3 laps  - 1/2 FR, airex and 1/2 FR - airex mat, airex beam, airex mat side stepping x 3 laps through sidestepping and forward and turn around - good sequencing, B UE support, good step length with cueing-Wth each rest period   Ladder step overs in // bars  - B HH support  -2 x 3 laps  -turn around in ladder box with airex pad in box  without UE support to improve transitions without UE support  - difficulty with sequencing between using hands with walking portion and not using hands with turning around  -second set added second airex pad in one ladder box   There ACT  Sit to stand- Good technique with less need for VC this session, good knee extension upon standing compared to previous sessions.   -VC for reaching back with UE to prevent uncontrolled descent in settings outside PT  - 2 x 10 reps         Therapeutic Exercises:   -Lumbar stretch with ball (seated)  - leaning forward and to each side  pushing green theraball forward x 10 reps each direction(  -Seated HS stretch- Hold 45 sec x 2 sets each LE- (VC and visual demo for correct technique)                             PT Education - 05/03/21 1152     Education provided Yes    Education Details sequencing of STS.    Person(s) Educated Patient    Methods Explanation    Comprehension Verbalized understanding;Need further instruction              PT Short Term Goals -  04/14/21 1120       PT SHORT TERM GOAL #1   Title Pt to demonstrate improved sustained AMB tolerance >825ft.    Baseline At evlauation: 611ft tolerance (not to failure); 9/26: 1384 ft    Time 4    Period Weeks    Status Achieved    Target Date 01/05/21      PT SHORT TERM GOAL #2   Title Pt will improve BERG balance score to greater than 45 to indicate decrease risk of falls.    Baseline Eval: 15/28; 9/26: deferred, 03/08/21: 15/28    Time 4    Period Weeks    Status On-going    Target Date 05/12/21      PT SHORT TERM GOAL #3   Title Pt to demonstrate 10MWT<13sec to show self-selected gait speed appropriate for AMB outside of house and to improve energy efficiency.    Baseline UpWalker Eval: 17.53sec; 9/26: 0.74 m/s (13.5)11/21: 13.68 (.73 m/s) 12.64 on 12/28    Time 4    Period Weeks    Status Achieved    Target Date 02/08/21               PT Long Term Goals - 04/14/21 1458       PT LONG TERM GOAL #1   Title Pt will improve BERG balance score to greater than 47 to indicate decreaed risk of falls.    Baseline 41 on 12/28    Time 8    Period Weeks    Status On-going    Target Date 05/31/21      PT LONG TERM GOAL #2   Title Pt to tolerate 1079ft sustained AMB with UpWalker without rest interval to improve safety and independence in limited community AMB.    Baseline At eval; tired after 661ft; 9/26: 1384 ft    Time 8    Period Weeks    Status Achieved    Target Date  05/03/21      PT LONG TERM GOAL #3   Title Pt to demonstrate improved FOTO score by 5 points compared to initital assessment.    Baseline 9/26: 49 (eval was 50) 911/21: 52 12/28 deferred as wife not present and pt not best historian    Time 8    Period Weeks    Status On-going    Target Date 05/03/21      PT LONG TERM GOAL #4   Title Pt to show tolerance of AMB>1376ft with UpWalker to improve tolerance to accessing the community for IADL.    Baseline Eval: 6105ft;  9/26: 1385    Time 12    Period Weeks    Status Achieved    Target Date 05/31/21      PT LONG TERM GOAL #5   Title Patient will increase six minute walk test distance to >1000 for progression to community ambulator and improve gait ability    Baseline 9/26: new 11/21: 850 feet with up Walker 12/28: 925 feet with up walker    Time 8    Period Weeks    Status On-going    Target Date 06/09/21                   Plan - 05/03/21 1153     Clinical Impression Statement Patient presents with excellent motivation for completion of physical therapy program. Patient able to tolerate several standing activities working on static and dynamic balance in today's session. Patient further educated regarding proper use of up  walker as patient tends to park up walker and attempt to step over it when transitioning to various surfaces, this continues to be a source of several of his falls. Pt also educated and reinforced on proper posture with sitting down to prevent uncontrolled descent into chairs when he is tired or loses his balance. Despite several instructions and practice patient does difficulty remembering the sequencing of tasks, particularly with up walker. Patient will continue to benefit from skilled physical therapy intervention in order to improve his lower extremity strength, improve balance, prevent falls, and improve his overall quality of life.    Personal Factors and Comorbidities Age;Past/Current Experience;Time since  onset of injury/illness/exacerbation;Transportation    Comorbidities Chronic back pain, orthostatic hypotension, repeated falls, parkinsonism    Examination-Activity Limitations Bathing;Bed Mobility;Caring for Others;Bend;Dressing;Lift;Locomotion Level;Reach Overhead;Sit;Squat;Stairs;Stand;Toileting;Transfers;Carry    Examination-Participation Restrictions Cleaning;Community Activity;Interpersonal Relationship;Shop;Volunteer;Yard Work;Other;Driving    Stability/Clinical Decision Making Evolving/Moderate complexity    Rehab Potential Fair    PT Frequency 2x / week    PT Duration 12 weeks    PT Treatment/Interventions ADLs/Self Care Home Management;Aquatic Therapy;Cryotherapy;Electrical Stimulation;Iontophoresis 4mg /ml Dexamethasone;Moist Heat;Ultrasound;Contrast Bath;DME Instruction;Gait training;Stair training;Functional mobility training;Therapeutic activities;Therapeutic exercise;Balance training;Neuromuscular re-education;Patient/family education;Manual techniques;Wheelchair mobility training;Energy conservation;Passive range of motion;Traction;Orthotic Fit/Training;Dry needling;Vestibular;Visual/perceptual remediation/compensation;Taping;Canalith Repostioning;Cognitive remediation    PT Next Visit Plan Progress static and dynamic balance and continue with caregiver/patient ed with low back stretching    PT Home Exercise Plan None set up at evaluation, still working on previous HEP from 6 months prior (STS, daily walking); 8/29: issued STS with focus on large amplitude movement at support surface; 9/12: PT provides handout with sit to stand exercise and large amplitude seated trunk rotation exercise.  9/19: reinforced HEP, provided handout for seated postural exercise with UE abduction, and seated exercise with trunk twists and UE reach, 10x for each, can be performed daily or minimum of 4x/week. No changes today.    Consulted and Agree with Plan of Care Patient;Family member/caregiver    Family  Member Consulted Wife             Patient will benefit from skilled therapeutic intervention in order to improve the following deficits and impairments:  Abnormal gait, Decreased balance, Decreased coordination, Decreased mobility, Postural dysfunction, Decreased strength, Decreased safety awareness, Improper body mechanics, Impaired flexibility, Decreased activity tolerance, Decreased endurance, Decreased knowledge of precautions, Difficulty walking, Pain, Cardiopulmonary status limiting activity, Decreased range of motion, Impaired perceived functional ability, Decreased cognition, Dizziness  Visit Diagnosis: Abnormality of gait and mobility  Difficulty in walking, not elsewhere classified  Unsteadiness on feet  Repeated falls     Problem List Patient Active Problem List   Diagnosis Date Noted   Sinus drainage 12/09/2020   Medicare annual wellness visit, subsequent 11/18/2020   Pacemaker    Dementia associated with Parkinson's disease ([place]) 06/17/2020   Vertigo 02/19/2020   Urine abnormality 02/19/2020   Dysphagia 02/23/2019   Fall at home 10/18/2017   REM behavioral disorder 11/02/2016   Radicular pain in right arm 10/09/2016   GERD (gastroesophageal reflux disease) 07/31/2016   Colon cancer screening 07/23/2015   Gout 02/18/2015   Depression 01/06/2015   Skin lesion 10/02/2014   Dupuytren's contracture 08/20/2014   Cough 07/21/2014   Lumbar stenosis with neurogenic claudication 06/13/2014   S/P deep brain stimulator placement 05/08/2014   Advance care planning 01/19/2014   Parkinson's disease ([place]) 12/13/2013   PTSD (post-traumatic stress disorder) 06/13/2013   Erectile dysfunction 06/13/2013   HLD (hyperlipidemia) 06/13/2013   Essential hypertension 06/03/2013  Bradycardia by electrocardiogram 06/03/2013   Obstructive sleep apnea 03/12/2013    Particia Lather, PT 05/03/2021, 12:12 PM  [place] [address] [place], [place], [address] Phone: [phone]   Fax:  [phone]  Name: [name] MRN: [id number] Date of Birth: Apr 27, 1950

## 2021-05-05 ENCOUNTER — Encounter: Payer: Medicare PPO | Admitting: Speech Pathology

## 2021-05-05 ENCOUNTER — Ambulatory Visit: Payer: Medicare PPO | Admitting: Physical Therapy

## 2021-05-05 ENCOUNTER — Other Ambulatory Visit: Payer: Self-pay

## 2021-05-05 DIAGNOSIS — R2681 Unsteadiness on feet: Secondary | ICD-10-CM | POA: Diagnosis not present

## 2021-05-05 DIAGNOSIS — R296 Repeated falls: Secondary | ICD-10-CM | POA: Diagnosis not present

## 2021-05-05 DIAGNOSIS — R278 Other lack of coordination: Secondary | ICD-10-CM | POA: Diagnosis not present

## 2021-05-05 DIAGNOSIS — R269 Unspecified abnormalities of gait and mobility: Secondary | ICD-10-CM

## 2021-05-05 DIAGNOSIS — R262 Difficulty in walking, not elsewhere classified: Secondary | ICD-10-CM

## 2021-05-05 DIAGNOSIS — G20 Parkinson's disease: Secondary | ICD-10-CM | POA: Diagnosis not present

## 2021-05-05 DIAGNOSIS — M6281 Muscle weakness (generalized): Secondary | ICD-10-CM | POA: Diagnosis not present

## 2021-05-05 NOTE — Therapy (Signed)
[place] MAIN [facility] SERVICES [address] [place], [place], [address] Phone: [phone]   Fax:  [phone]  Physical Therapy Treatment  Patient Details  Name: [name] MRN: [id number] Date of Birth: 10/20/1950 Referring Provider (PT): Elsie Stain, MD   Encounter Date: 05/05/2021   PT End of Session - 05/05/21 1420     Visit Number 34    Number of Visits 48    Date for PT Re-Evaluation 05/31/21    Authorization Type [employer] PPO    Authorization Time Period recert for 6/83-41/96    Progress Note Due on Visit 40    PT Start Time 1055    PT Stop Time 1140    PT Time Calculation (min) 45 min    Equipment Utilized During Treatment Gait belt    Activity Tolerance Patient tolerated treatment well    Behavior During Therapy [facility] for tasks assessed/performed             Past Medical History:  Diagnosis Date   Arthritis    Bradycardia    Cancer ([place] 2013   skin cancer   Depression    ptsd   Dysrhythmia    chronic slow heart rate   GERD (gastroesophageal reflux disease)    Headache(784.0)    tension headaches non recent   History of chicken pox    History of kidney stones    passed   Hypertension    treated with HCTZ   Pacemaker    Parkinson's disease ([place])    dx'ed 15 years ago   PTSD (post-traumatic stress disorder)    Shortness of breath dyspnea    Sleep apnea    doesn't use C-pap   Varicose veins     Past Surgical History:  Procedure Laterality Date   CHOLECYSTECTOMY N/A 10/22/2014   Procedure: LAPAROSCOPIC CHOLECYSTECTOMY WITH INTRAOPERATIVE CHOLANGIOGRAM;  Surgeon: Dia Crawford III, MD;  Location: [facility] ORS;  Service: General;  Laterality: N/A;   cyst removed      from lip as a child   INTRAMEDULLARY (IM) NAIL INTERTROCHANTERIC N/A 02/18/2019   Procedure: INTRAMEDULLARY (IM) NAIL INTERTROCHANTRIC, RIGHT,;  Surgeon: Thornton Park, MD;  Location: [facility] ORS;  Service: Orthopedics;  Laterality:  N/A;   LUMBAR LAMINECTOMY/DECOMPRESSION MICRODISCECTOMY Bilateral 12/14/2012   Procedure: Bilateral lumbar three-four, four-five decompressive laminotomy/foraminotomy;  Surgeon: Charlie Pitter, MD;  Location: [place] NEURO ORS;  Service: Neurosurgery;  Laterality: Bilateral;   PULSE GENERATOR IMPLANT Bilateral 12/13/2013   Procedure: Bilateral implantable pulse generator placement;  Surgeon: Erline Levine, MD;  Location: [place] NEURO ORS;  Service: Neurosurgery;  Laterality: Bilateral;  Bilateral implantable pulse generator placement   skin cancer removed     from ears,   12 lft arm  rt leg 15   SUBTHALAMIC STIMULATOR BATTERY REPLACEMENT Bilateral 07/14/2017   Procedure: BILATERAL IMPLANTED PULSE GENERATOR CHANGE FOR DEEP BRAIN STIMULATOR;  Surgeon: Erline Levine, MD;  Location: [place];  Service: Neurosurgery;  Laterality: Bilateral;   SUBTHALAMIC STIMULATOR INSERTION Bilateral 12/06/2013   Procedure: SUBTHALAMIC STIMULATOR INSERTION;  Surgeon: Erline Levine, MD;  Location: [place] NEURO ORS;  Service: Neurosurgery;  Laterality: Bilateral;  Bilateral deep brain stimulator placement    There were no vitals filed for this visit.   Subjective Assessment - 05/05/21 1059     Subjective Pt reports no new falls or LOB since last session. Reports getting yoga ball for back stretches, just needs to pump it up (per his wife)    Patient is  accompained by: Family member    Pertinent History [name] is a 30yoM who comes to [facility] neuro for evaluation of conitnued difficulty of imbalance, unsteadiness on feet, and generally limited tolerance to mobility in the setting of chronic parkinsonism. SInce DC in February, pt has had PPM placement due to bradycardia issues. Pt also started using an UpWalker device 2 week prior to this evaluation, goal was to help with gradual melting of postural extension that occured with sustained upright actiivty. Pt has continued to practice supervised AMB with caregivers, short distances out of  house and in community. Pt continues to experience frequent falls on a weekly basis, at a rate that both he and wife report to be increasing in frequency. PMH: HTN, bradycardia s/p PPM, PD, DBS, COVID-19 infection, chronic low back pain s/p L4/5 surgery.    Limitations Walking;Standing;Sitting;House hold activities;Lifting    How long can you sit comfortably? Not limited    How long can you stand comfortably? 5 minutes with support    How long can you walk comfortably? ~5-6 minutes UpWalker Walker    Patient Stated Goals Reduce falls, improve AMB tolerance    Pain Score 4     Pain Location Back    Pain Orientation Lower    Pain Descriptors / Indicators Aching    Pain Onset More than a month ago    Pain Onset More than a month ago             INTERVENTIONS:   Neuromuscular re-education: Unless otherwise stated, CGA was provided and gait belt donned in order to ensure pt safety The following activities were completed in parallel bars or at balance bar   Stair taps on airex pad  Toe taps to 6 inch step x 10 ea - B UE assist Toe taps to 12 inch step x 10 ea - B UE assist   -cues for quick and deliberate movement to improve movement initiation  Toe taps to 12 inch step x 10 ea - no UE assist, cues for maintenance of trunk stability (tends to lean to left lateraly when on R LE SLS.      Obstacle course in 2 x 3 laps  -airex, hurdle, 4 inch step sidestepping to improve lateral stability and strength  - - good sequencing, B UE support, good step length with cueing- Obstacle course : airex, hurdle, airex - unilateral UE assist with hurdle and stepping on/off airex but challenged to turn around without UE to improve ability to transfer when UE assist is not available. -1 LOB when pt attempted airex step up without UE assist, cues for UE provided with each repetition following. Reinforcing importance of upwalker to prevent lOB and falls   There ACT  Sit to stand- Good technique with less  need for VC this session, good knee extension upon standing compared to previous sessions.   -VC for reaching back with UE to prevent uncontrolled descent in settings outside PT  - 2 x 10 reps  -second set with airex pad under feet to increase difficulty (essentially lower surface, and to progress with STS on unsteady surfaces.         Therapeutic Exercises:   -Lumbar stretch with ball (seated) - leaning forward and to each side  pushing green theraball forward x 10 reps each direction(   -Seated HS stretch- Hold 45 sec x 2 sets each LE- (VC and visual demo for correct technique, pt tends to bend knee with increased stretch range)  PT Education - 05/05/21 1420     Education provided Yes    Education Details review of sequencing of tasks    Person(s) Educated Patient    Methods Explanation    Comprehension Verbalized understanding;Returned demonstration              PT Short Term Goals - 04/14/21 1120       PT SHORT TERM GOAL #1   Title Pt to demonstrate improved sustained AMB tolerance >841ft.    Baseline At evlauation: 639ft tolerance (not to failure); 9/26: 1384 ft    Time 4    Period Weeks    Status Achieved    Target Date 01/05/21      PT SHORT TERM GOAL #2   Title Pt will improve BERG balance score to greater than 45 to indicate decrease risk of falls.    Baseline Eval: 15/28; 9/26: deferred, 03/08/21: 15/28    Time 4    Period Weeks    Status On-going    Target Date 05/12/21      PT SHORT TERM GOAL #3   Title Pt to demonstrate 10MWT<13sec to show self-selected gait speed appropriate for AMB outside of house and to improve energy efficiency.    Baseline UpWalker Eval: 17.53sec; 9/26: 0.74 m/s (13.5)11/21: 13.68 (.73 m/s) 12.64 on 12/28    Time 4    Period Weeks    Status Achieved    Target Date 02/08/21               PT Long Term Goals - 04/14/21 1458       PT LONG  TERM GOAL #1   Title Pt will improve BERG balance score to greater than 47 to indicate decreaed risk of falls.    Baseline 41 on 12/28    Time 8    Period Weeks    Status On-going    Target Date 05/31/21      PT LONG TERM GOAL #2   Title Pt to tolerate 1065ft sustained AMB with UpWalker without rest interval to improve safety and independence in limited community AMB.    Baseline At eval; tired after 635ft; 9/26: 1384 ft    Time 8    Period Weeks    Status Achieved    Target Date 05/03/21      PT LONG TERM GOAL #3   Title Pt to demonstrate improved FOTO score by 5 points compared to initital assessment.    Baseline 9/26: 49 (eval was 50) 911/21: 52 12/28 deferred as wife not present and pt not best historian    Time 8    Period Weeks    Status On-going    Target Date 05/03/21      PT LONG TERM GOAL #4   Title Pt to show tolerance of AMB>1399ft with UpWalker to improve tolerance to accessing the community for IADL.    Baseline Eval: 664ft;  9/26: 1385    Time 12    Period Weeks    Status Achieved    Target Date 05/31/21      PT LONG TERM GOAL #5   Title Patient will increase six minute walk test distance to >1000 for progression to community ambulator and improve gait ability    Baseline 9/26: new 11/21: 850 feet with up Walker 12/28: 925 feet with up walker    Time 8    Period Weeks    Status On-going    Target Date 06/09/21  Plan - 05/05/21 1421     Clinical Impression Statement Patient presents with excellent motivation for completion of physical therapy program. Patient able to tolerate several standing activities working on static and dynamic balance in today's session. Pt demonstrated improved memory for sequencing of tasks to prevent falls or LOB this session. Pt does have some difficulty with complex motor tasks and forgets to utilize UE for support with more complex sequencing of tasks. Will continue to work on this in future sessions.   Patient will continue to benefit from skilled physical therapy intervention in order to improve his lower extremity strength, improve balance, prevent falls, and improve his overall quality of life.    Personal Factors and Comorbidities Age;Past/Current Experience;Time since onset of injury/illness/exacerbation;Transportation    Comorbidities Chronic back pain, orthostatic hypotension, repeated falls, parkinsonism    Examination-Activity Limitations Bathing;Bed Mobility;Caring for Others;Bend;Dressing;Lift;Locomotion Level;Reach Overhead;Sit;Squat;Stairs;Stand;Toileting;Transfers;Carry    Examination-Participation Restrictions Cleaning;Community Activity;Interpersonal Relationship;Shop;Volunteer;Yard Work;Other;Driving    Stability/Clinical Decision Making Evolving/Moderate complexity    Rehab Potential Fair    PT Frequency 2x / week    PT Duration 12 weeks    PT Treatment/Interventions ADLs/Self Care Home Management;Aquatic Therapy;Cryotherapy;Electrical Stimulation;Iontophoresis 4mg /ml Dexamethasone;Moist Heat;Ultrasound;Contrast Bath;DME Instruction;Gait training;Stair training;Functional mobility training;Therapeutic activities;Therapeutic exercise;Balance training;Neuromuscular re-education;Patient/family education;Manual techniques;Wheelchair mobility training;Energy conservation;Passive range of motion;Traction;Orthotic Fit/Training;Dry needling;Vestibular;Visual/perceptual remediation/compensation;Taping;Canalith Repostioning;Cognitive remediation    PT Next Visit Plan Progress static and dynamic balance and continue with caregiver/patient ed with low back stretching    PT Home Exercise Plan None set up at evaluation, still working on previous HEP from 6 months prior (STS, daily walking); 8/29: issued STS with focus on large amplitude movement at support surface; 9/12: PT provides handout with sit to stand exercise and large amplitude seated trunk rotation exercise.  9/19: reinforced HEP, provided  handout for seated postural exercise with UE abduction, and seated exercise with trunk twists and UE reach, 10x for each, can be performed daily or minimum of 4x/week. No changes today.    Consulted and Agree with Plan of Care Patient;Family member/caregiver    Family Member Consulted Wife             Patient will benefit from skilled therapeutic intervention in order to improve the following deficits and impairments:  Abnormal gait, Decreased balance, Decreased coordination, Decreased mobility, Postural dysfunction, Decreased strength, Decreased safety awareness, Improper body mechanics, Impaired flexibility, Decreased activity tolerance, Decreased endurance, Decreased knowledge of precautions, Difficulty walking, Pain, Cardiopulmonary status limiting activity, Decreased range of motion, Impaired perceived functional ability, Decreased cognition, Dizziness  Visit Diagnosis: Abnormality of gait and mobility  Difficulty in walking, not elsewhere classified  Unsteadiness on feet  Repeated falls     Problem List Patient Active Problem List   Diagnosis Date Noted   Sinus drainage 12/09/2020   Medicare annual wellness visit, subsequent 11/18/2020   Pacemaker    Dementia associated with Parkinson's disease ([place]) 06/17/2020   Vertigo 02/19/2020   Urine abnormality 02/19/2020   Dysphagia 02/23/2019   Fall at home 10/18/2017   REM behavioral disorder 11/02/2016   Radicular pain in right arm 10/09/2016   GERD (gastroesophageal reflux disease) 07/31/2016   Colon cancer screening 07/23/2015   Gout 02/18/2015   Depression 01/06/2015   Skin lesion 10/02/2014   Dupuytren's contracture 08/20/2014   Cough 07/21/2014   Lumbar stenosis with neurogenic claudication 06/13/2014   S/P deep brain stimulator placement 05/08/2014   Advance care planning 01/19/2014   Parkinson's disease ([place]) 12/13/2013   PTSD (post-traumatic stress disorder) 06/13/2013   Erectile  dysfunction 06/13/2013   HLD  (hyperlipidemia) 06/13/2013   Essential hypertension 06/03/2013   Bradycardia by electrocardiogram 06/03/2013   Obstructive sleep apnea 03/12/2013    [name], PT 05/05/2021, 2:24 PM  [place] MAIN [facility] SERVICES [address] [place], [place], [address] Phone: [phone]   Fax:  [phone]  Name: [name] MRN: [id number] Date of Birth: Jul 22, 1950

## 2021-05-07 DIAGNOSIS — Z9689 Presence of other specified functional implants: Secondary | ICD-10-CM | POA: Diagnosis not present

## 2021-05-07 DIAGNOSIS — G20 Parkinson's disease: Secondary | ICD-10-CM | POA: Diagnosis not present

## 2021-05-10 ENCOUNTER — Encounter: Payer: Self-pay | Admitting: Physical Therapy

## 2021-05-10 ENCOUNTER — Other Ambulatory Visit: Payer: Self-pay

## 2021-05-10 ENCOUNTER — Encounter: Payer: Medicare PPO | Admitting: Speech Pathology

## 2021-05-10 ENCOUNTER — Ambulatory Visit: Payer: Medicare PPO | Admitting: Physical Therapy

## 2021-05-10 DIAGNOSIS — R262 Difficulty in walking, not elsewhere classified: Secondary | ICD-10-CM | POA: Diagnosis not present

## 2021-05-10 DIAGNOSIS — R2681 Unsteadiness on feet: Secondary | ICD-10-CM | POA: Diagnosis not present

## 2021-05-10 DIAGNOSIS — M6281 Muscle weakness (generalized): Secondary | ICD-10-CM | POA: Diagnosis not present

## 2021-05-10 DIAGNOSIS — R296 Repeated falls: Secondary | ICD-10-CM | POA: Diagnosis not present

## 2021-05-10 DIAGNOSIS — G20 Parkinson's disease: Secondary | ICD-10-CM | POA: Diagnosis not present

## 2021-05-10 DIAGNOSIS — R269 Unspecified abnormalities of gait and mobility: Secondary | ICD-10-CM

## 2021-05-10 DIAGNOSIS — R278 Other lack of coordination: Secondary | ICD-10-CM | POA: Diagnosis not present

## 2021-05-10 NOTE — Patient Instructions (Signed)
Updated HEP   [email or web address]  Access Code: [email or web address]

## 2021-05-10 NOTE — Therapy (Signed)
[place] MAIN [facility] SERVICES [address] [place], [place], [address] Phone: [phone]   Fax:  [phone]  Physical Therapy Treatment  Patient Details  Name: [name] MRN: [id number] Date of Birth: 05-Dec-1950 Referring Provider (PT): Elsie Stain, MD   Encounter Date: 05/10/2021   PT End of Session - 05/10/21 1422     Visit Number 35    Number of Visits 31    Date for PT Re-Evaluation 05/31/21    Authorization Type [employer] PPO    Authorization Time Period recert for 8/28-00/34    Progress Note Due on Visit 26    PT Start Time 1057    PT Stop Time 1146    PT Time Calculation (min) 49 min    Equipment Utilized During Treatment Gait belt    Activity Tolerance Patient tolerated treatment well    Behavior During Therapy [facility] for tasks assessed/performed             Past Medical History:  Diagnosis Date   Arthritis    Bradycardia    Cancer ([place]) 2013   skin cancer   Depression    ptsd   Dysrhythmia    chronic slow heart rate   GERD (gastroesophageal reflux disease)    Headache(784.0)    tension headaches non recent   History of chicken pox    History of kidney stones    passed   Hypertension    treated with HCTZ   Pacemaker    Parkinson's disease ([place])    dx'ed 15 years ago   PTSD (post-traumatic stress disorder)    Shortness of breath dyspnea    Sleep apnea    doesn't use C-pap   Varicose veins     Past Surgical History:  Procedure Laterality Date   CHOLECYSTECTOMY N/A 10/22/2014   Procedure: LAPAROSCOPIC CHOLECYSTECTOMY WITH INTRAOPERATIVE CHOLANGIOGRAM;  Surgeon: Dia Crawford III, MD;  Location: [facility] ORS;  Service: General;  Laterality: N/A;   cyst removed      from lip as a child   INTRAMEDULLARY (IM) NAIL INTERTROCHANTERIC N/A 02/18/2019   Procedure: INTRAMEDULLARY (IM) NAIL INTERTROCHANTRIC, RIGHT,;  Surgeon: Thornton Park, MD;  Location: [facility] ORS;  Service: Orthopedics;  Laterality:  N/A;   LUMBAR LAMINECTOMY/DECOMPRESSION MICRODISCECTOMY Bilateral 12/14/2012   Procedure: Bilateral lumbar three-four, four-five decompressive laminotomy/foraminotomy;  Surgeon: Charlie Pitter, MD;  Location: [place] NEURO ORS;  Service: Neurosurgery;  Laterality: Bilateral;   PULSE GENERATOR IMPLANT Bilateral 12/13/2013   Procedure: Bilateral implantable pulse generator placement;  Surgeon: Erline Levine, MD;  Location: [place] NEURO ORS;  Service: Neurosurgery;  Laterality: Bilateral;  Bilateral implantable pulse generator placement   skin cancer removed     from ears,   12 lft arm  rt leg 15   SUBTHALAMIC STIMULATOR BATTERY REPLACEMENT Bilateral 07/14/2017   Procedure: BILATERAL IMPLANTED PULSE GENERATOR CHANGE FOR DEEP BRAIN STIMULATOR;  Surgeon: Erline Levine, MD;  Location: [place];  Service: Neurosurgery;  Laterality: Bilateral;   SUBTHALAMIC STIMULATOR INSERTION Bilateral 12/06/2013   Procedure: SUBTHALAMIC STIMULATOR INSERTION;  Surgeon: Erline Levine, MD;  Location: [place] NEURO ORS;  Service: Neurosurgery;  Laterality: Bilateral;  Bilateral deep brain stimulator placement    There were no vitals filed for this visit.   Subjective Assessment - 05/10/21 1140     Subjective Pt reports no new falls or LOB since last session. Reports visit with MD went well and soon he will be starting OT as well as a new prescription for slow  relaease PD medication. Pt has not changed medication yet.    Patient is accompained by: Family member    Pertinent History [name] is a 21yoM who comes to [facility] for evaluation of conitnued difficulty of imbalance, unsteadiness on feet, and generally limited tolerance to mobility in the setting of chronic parkinsonism. SInce DC in February, pt has had PPM placement due to bradycardia issues. Pt also started using an UpWalker device 2 week prior to this evaluation, goal was to help with gradual melting of postural extension that occured with sustained upright actiivty. Pt has  continued to practice supervised AMB with caregivers, short distances out of house and in community. Pt continues to experience frequent falls on a weekly basis, at a rate that both he and wife report to be increasing in frequency. PMH: HTN, bradycardia s/p PPM, PD, DBS, COVID-19 infection, chronic low back pain s/p L4/5 surgery.    Limitations Walking;Standing;Sitting;House hold activities;Lifting    How long can you sit comfortably? Not limited    How long can you stand comfortably? 5 minutes with support    How long can you walk comfortably? ~5-6 minutes UpWalker Walker    Patient Stated Goals Reduce falls, improve AMB tolerance    Currently in Pain? No/denies    Pain Onset More than a month ago    Pain Onset More than a month ago                       INTERVENTIONS:   Neuromuscular re-education: Unless otherwise stated, CGA was provided and gait belt donned in order to ensure pt safety The following activities were completed in parallel bars or at balance bar   Stair taps on airex pad  Toe taps to 6 inch step plus airex pad  2 x 10 ea - B UE assist   -cues for quick and deliberate movement to improve movement initiation, second set pt had some difficulty with R LE foot clearance  Toe taps to 6 inch step x 10 ea - no UE assist, cues for maintenance of trunk stability (tends to lean to left lateraly when on R LE SLS.    Ladder drill in // bars with airex on either side 2 x 4 laps through, UE support while in ladder, no UE suppot when turning 180 degres on airex pad   Obstacle course in 2 x 4 laps  -airex, hurdle, airex step sidestepping to improve lateral stability and strength  - - good sequencing, B UE support, good step length with cueing- with fatigue reverts to hip flexion and some hip rotation in comparison to hip abductor dominant movement for side steps    There ACT  Sit to stand- Good technique with less need for VC this session, good knee extension upon  standing compared to previous sessions.   -VC for reaching back with UE to prevent uncontrolled descent in settings outside PT  - 2 x 10 reps         Therapeutic Exercises:   -Lumbar stretch with ball (seated) - leaning forward and to each side  pushing green theraball forward x 10 reps each direction(                                PT Education - 05/10/21 1422     Education provided Yes    Education Details Provided updated HEP    Person(s) Educated  Patient;Spouse    Methods Explanation    Comprehension Verbalized understanding              PT Short Term Goals - 04/14/21 1120       PT SHORT TERM GOAL #1   Title Pt to demonstrate improved sustained AMB tolerance >847ft.    Baseline At evlauation: 668ft tolerance (not to failure); 9/26: 1384 ft    Time 4    Period Weeks    Status Achieved    Target Date 01/05/21      PT SHORT TERM GOAL #2   Title Pt will improve BERG balance score to greater than 45 to indicate decrease risk of falls.    Baseline Eval: 15/28; 9/26: deferred, 03/08/21: 15/28    Time 4    Period Weeks    Status On-going    Target Date 05/12/21      PT SHORT TERM GOAL #3   Title Pt to demonstrate 10MWT<13sec to show self-selected gait speed appropriate for AMB outside of house and to improve energy efficiency.    Baseline UpWalker Eval: 17.53sec; 9/26: 0.74 m/s (13.5)11/21: 13.68 (.73 m/s) 12.64 on 12/28    Time 4    Period Weeks    Status Achieved    Target Date 02/08/21               PT Long Term Goals - 04/14/21 1458       PT LONG TERM GOAL #1   Title Pt will improve BERG balance score to greater than 47 to indicate decreaed risk of falls.    Baseline 41 on 12/28    Time 8    Period Weeks    Status On-going    Target Date 05/31/21      PT LONG TERM GOAL #2   Title Pt to tolerate 1020ft sustained AMB with UpWalker without rest interval to improve safety and independence in limited community AMB.     Baseline At eval; tired after 632ft; 9/26: 1384 ft    Time 8    Period Weeks    Status Achieved    Target Date 05/03/21      PT LONG TERM GOAL #3   Title Pt to demonstrate improved FOTO score by 5 points compared to initital assessment.    Baseline 9/26: 49 (eval was 50) 911/21: 52 12/28 deferred as wife not present and pt not best historian    Time 8    Period Weeks    Status On-going    Target Date 05/03/21      PT LONG TERM GOAL #4   Title Pt to show tolerance of AMB>1339ft with UpWalker to improve tolerance to accessing the community for IADL.    Baseline Eval: 683ft;  9/26: 1385    Time 12    Period Weeks    Status Achieved    Target Date 05/31/21      PT LONG TERM GOAL #5   Title Patient will increase six minute walk test distance to >1000 for progression to community ambulator and improve gait ability    Baseline 9/26: new 11/21: 850 feet with up Walker 12/28: 925 feet with up walker    Time 8    Period Weeks    Status On-going    Target Date 06/09/21                   Plan - 05/10/21 1423     Clinical Impression Statement Patient presents with excellent motivation  for completion of physical therapy program. Patient able to tolerate several standing activities working on static and dynamic balance in today's session.Pt deomnstrated imroved sequencing of complex tasks this session compared to last session. Pt continues to demonstrate significantly iproves stability with UE support while ambualting compared to no upper extreity support.  Patient will continue to benefit from skilled physical therapy intervention in order to improve his lower extremity strength, improve balance, prevent falls, and improve his overall quality of life.    Personal Factors and Comorbidities Age;Past/Current Experience;Time since onset of injury/illness/exacerbation;Transportation    Comorbidities Chronic back pain, orthostatic hypotension, repeated falls, parkinsonism     Examination-Activity Limitations Bathing;Bed Mobility;Caring for Others;Bend;Dressing;Lift;Locomotion Level;Reach Overhead;Sit;Squat;Stairs;Stand;Toileting;Transfers;Carry    Examination-Participation Restrictions Cleaning;Community Activity;Interpersonal Relationship;Shop;Volunteer;Yard Work;Other;Driving    Stability/Clinical Decision Making Evolving/Moderate complexity    Rehab Potential Fair    PT Frequency 2x / week    PT Duration 12 weeks    PT Treatment/Interventions ADLs/Self Care Home Management;Aquatic Therapy;Cryotherapy;Electrical Stimulation;Iontophoresis 4mg /ml Dexamethasone;Moist Heat;Ultrasound;Contrast Bath;DME Instruction;Gait training;Stair training;Functional mobility training;Therapeutic activities;Therapeutic exercise;Balance training;Neuromuscular re-education;Patient/family education;Manual techniques;Wheelchair mobility training;Energy conservation;Passive range of motion;Traction;Orthotic Fit/Training;Dry needling;Vestibular;Visual/perceptual remediation/compensation;Taping;Canalith Repostioning;Cognitive remediation    PT Next Visit Plan Progress static and dynamic balance and continue with caregiver/patient ed with low back stretching    PT Home Exercise Plan None set up at evaluation, still working on previous HEP from 6 months prior (STS, daily walking); 8/29: issued STS with focus on large amplitude movement at support surface; 9/12: PT provides handout with sit to stand exercise and large amplitude seated trunk rotation exercise.  9/19: reinforced HEP, provided handout for seated postural exercise with UE abduction, and seated exercise with trunk twists and UE reach, 10x for each, can be performed daily or minimum of 4x/week. No changes today.    Consulted and Agree with Plan of Care Patient;Family member/caregiver    Family Member Consulted Wife             Patient will benefit from skilled therapeutic intervention in order to improve the following deficits and  impairments:  Abnormal gait, Decreased balance, Decreased coordination, Decreased mobility, Postural dysfunction, Decreased strength, Decreased safety awareness, Improper body mechanics, Impaired flexibility, Decreased activity tolerance, Decreased endurance, Decreased knowledge of precautions, Difficulty walking, Pain, Cardiopulmonary status limiting activity, Decreased range of motion, Impaired perceived functional ability, Decreased cognition, Dizziness  Visit Diagnosis: Abnormality of gait and mobility  Difficulty in walking, not elsewhere classified  Unsteadiness on feet  Repeated falls     Problem List Patient Active Problem List   Diagnosis Date Noted   Sinus drainage 12/09/2020   Medicare annual wellness visit, subsequent 11/18/2020   Pacemaker    Dementia associated with Parkinson's disease ([place]) 06/17/2020   Vertigo 02/19/2020   Urine abnormality 02/19/2020   Dysphagia 02/23/2019   Fall at home 10/18/2017   REM behavioral disorder 11/02/2016   Radicular pain in right arm 10/09/2016   GERD (gastroesophageal reflux disease) 07/31/2016   Colon cancer screening 07/23/2015   Gout 02/18/2015   Depression 01/06/2015   Skin lesion 10/02/2014   Dupuytren's contracture 08/20/2014   Cough 07/21/2014   Lumbar stenosis with neurogenic claudication 06/13/2014   S/P deep brain stimulator placement 05/08/2014   Advance care planning 01/19/2014   Parkinson's disease ([place]) 12/13/2013   PTSD (post-traumatic stress disorder) 06/13/2013   Erectile dysfunction 06/13/2013   HLD (hyperlipidemia) 06/13/2013   Essential hypertension 06/03/2013   Bradycardia by electrocardiogram 06/03/2013   Obstructive sleep apnea 03/12/2013    Particia Lather, PT 05/10/2021,  2:30 PM  [place] MAIN [facility] SERVICES [address] [place], [place], [address] Phone: [phone]   Fax:  [phone]  Name: [name] MRN: [id number] Date of Birth:  18-Oct-1950

## 2021-05-11 ENCOUNTER — Ambulatory Visit (INDEPENDENT_AMBULATORY_CARE_PROVIDER_SITE_OTHER): Payer: Medicare PPO | Admitting: Psychology

## 2021-05-11 DIAGNOSIS — F4323 Adjustment disorder with mixed anxiety and depressed mood: Secondary | ICD-10-CM

## 2021-05-11 DIAGNOSIS — F331 Major depressive disorder, recurrent, moderate: Secondary | ICD-10-CM

## 2021-05-11 NOTE — Progress Notes (Signed)
[facility] Counselor/Therapist Progress Note  Patient ID: [name], MRN: [id number],    Date: 05/11/2021  Time Spent: 60 minutes  Treatment Type: Individual Therapy  Reported Symptoms: sadness, and boredom  Mental Status Exam: Appearance:  Casual     Behavior: Drowsy  Motor: Psychomotor Retardation  Speech/Language:  Slow  Affect: Blunt  Mood: normal  Thought process: concrete  Thought content:   WNL  Sensory/Perceptual disturbances:   WNL  Orientation: oriented to person, place, time/date, and situation  Attention: Fair  Concentration: Fair  Memory: Immediate;   [place] of knowledge:  Fair  Insight:   Fair  Judgment:  Fair  Impulse Control: Fair   Risk Assessment: Danger to Self:  No Self-injurious Behavior: No Danger to Others: No Duty to Warn:no Physical Aggression / Violence:No  Access to Firearms a concern: No  Gang Involvement:No   Subjective: The patient attended a face-to-face individual therapy session via video visit today.  The patient gave verbal consent for the video to be on WebEx.  The patient was in his home alone and therapist was in the office.  The patient presents with a flat affect and mood is pleasant.  The patient reports that he continues to do what he needs to do at home and goes to the doctor a lot.  The patient states that he is having some hallucinations and I recommended that he speak with his neurologist about this if it got worse.  The patient did talk about being frustrated because he cannot do what he used to do.  The patient is on an antidepressant.  He continues to have a supportive wife and she tries to keep his life as full as possible.  The patient did get energized when we spoke about a new book that he is reading.  We talked about discussing it further the next session.  Offered supportive therapy and cognitive behavioral therapy today.    Interventions: Ego-Supportive, CBT  Diagnosis:Major depressive  disorder, recurrent episode, moderate (HCC)  Adjustment disorder with mixed anxiety and depressed mood  Plan: Please see Treatment plan in Therapy Charts with target date of 04/30/2022 for goals and progress towards goals.  Plan is to continue to offer supportive therapy to patient to help him deal with his depression related to his Parkinsons diagnosis.  Patient approved Treatment Plan in Therapy Charts.  Merna Baldi G Sorren Vallier, LCSW                  Margeaux Swantek G Persephanie Laatsch, LCSW

## 2021-05-12 ENCOUNTER — Other Ambulatory Visit: Payer: Self-pay

## 2021-05-12 ENCOUNTER — Encounter: Payer: Medicare PPO | Admitting: Speech Pathology

## 2021-05-12 ENCOUNTER — Ambulatory Visit: Payer: Medicare PPO

## 2021-05-12 ENCOUNTER — Ambulatory Visit: Payer: Medicare PPO | Admitting: Physical Therapy

## 2021-05-12 ENCOUNTER — Encounter: Payer: Self-pay | Admitting: Physical Therapy

## 2021-05-12 DIAGNOSIS — R262 Difficulty in walking, not elsewhere classified: Secondary | ICD-10-CM

## 2021-05-12 DIAGNOSIS — R2681 Unsteadiness on feet: Secondary | ICD-10-CM | POA: Diagnosis not present

## 2021-05-12 DIAGNOSIS — R296 Repeated falls: Secondary | ICD-10-CM | POA: Diagnosis not present

## 2021-05-12 DIAGNOSIS — M6281 Muscle weakness (generalized): Secondary | ICD-10-CM | POA: Diagnosis not present

## 2021-05-12 DIAGNOSIS — G20 Parkinson's disease: Secondary | ICD-10-CM | POA: Diagnosis not present

## 2021-05-12 DIAGNOSIS — R269 Unspecified abnormalities of gait and mobility: Secondary | ICD-10-CM

## 2021-05-12 DIAGNOSIS — R278 Other lack of coordination: Secondary | ICD-10-CM

## 2021-05-12 NOTE — Therapy (Signed)
[place] MAIN [facility] SERVICES [address] [place], [place], [address] Phone: [phone]   Fax:  [phone]  Physical Therapy Treatment  Patient Details  Name: [name] MRN: [id number] Date of Birth: 1950/11/23 Referring Provider (PT): Elsie Stain, MD   Encounter Date: 05/12/2021   PT End of Session - 05/12/21 0957     Visit Number 36    Number of Visits 48    Date for PT Re-Evaluation 05/31/21    Authorization Type [employer] PPO    Authorization Time Period recert for 8/67-61/95    Progress Note Due on Visit 40    PT Start Time 1052    PT Stop Time 1138    PT Time Calculation (min) 46 min    Equipment Utilized During Treatment Gait belt    Activity Tolerance Patient tolerated treatment well    Behavior During Therapy WFL for tasks assessed/performed             Past Medical History:  Diagnosis Date   Arthritis    Bradycardia    Cancer ([place]) 2013   skin cancer   Depression    ptsd   Dysrhythmia    chronic slow heart rate   GERD (gastroesophageal reflux disease)    Headache(784.0)    tension headaches non recent   History of chicken pox    History of kidney stones    passed   Hypertension    treated with HCTZ   Pacemaker    Parkinson's disease ([place])    dx'ed 15 years ago   PTSD (post-traumatic stress disorder)    Shortness of breath dyspnea    Sleep apnea    doesn't use C-pap   Varicose veins     Past Surgical History:  Procedure Laterality Date   CHOLECYSTECTOMY N/A 10/22/2014   Procedure: LAPAROSCOPIC CHOLECYSTECTOMY WITH INTRAOPERATIVE CHOLANGIOGRAM;  Surgeon: Dia Crawford III, MD;  Location: [facility] ORS;  Service: General;  Laterality: N/A;   cyst removed      from lip as a child   INTRAMEDULLARY (IM) NAIL INTERTROCHANTERIC N/A 02/18/2019   Procedure: INTRAMEDULLARY (IM) NAIL INTERTROCHANTRIC, RIGHT,;  Surgeon: Thornton Park, MD;  Location: [facility] ORS;  Service: Orthopedics;  Laterality:  N/A;   LUMBAR LAMINECTOMY/DECOMPRESSION MICRODISCECTOMY Bilateral 12/14/2012   Procedure: Bilateral lumbar three-four, four-five decompressive laminotomy/foraminotomy;  Surgeon: Charlie Pitter, MD;  Location: [place] [facility]URO ORS;  Service: Neurosurgery;  Laterality: Bilateral;   PULSE GENERATOR IMPLANT Bilateral 12/13/2013   Procedure: Bilateral implantable pulse generator placement;  Surgeon: Erline Levine, MD;  Location: [place] [facility]UR[facility] ORS;  Service: Neurosurgery;  Laterality: Bilateral;  Bilateral implantable pulse generator placement   skin cancer removed     from ears,   12 lft arm  rt leg 15   SUBTHALAMIC STIMULATOR BATTERY REPLACEMENT Bilateral 07/14/2017   Procedure: BILATERAL IMPLANTED PULSE GENERATOR CHANGE FOR DEEP BRAIN STIMULATOR;  Surgeon: Erline Levine, MD;  Location: [place];  Service: Neurosurgery;  Laterality: Bilateral;   SUBTHALAMIC STIMULATOR INSERTION Bilateral 12/06/2013   Procedure: SUBTHALAMIC STIMULATOR INSERTION;  Surgeon: Erline Levine, MD;  Location: [place] [facility]UR[facility] ORS;  Service: Neurosurgery;  Laterality: Bilateral;  Bilateral deep brain stimulator placement    There were no vitals filed for this visit.   Subjective Assessment - 05/12/21 1237     Subjective Pt reports no new fals or LOB, caregiver reports she has been keeping close eye on him and today has been a good day.    Patient is accompained by:  Family member    Pertinent History [name] is a 66yoM who comes to [facility] neuro for evaluation of conitnued difficulty of imbalance, unsteadiness on feet, and generally limited tolerance to mobility in the setting of chronic parkinsonism. SInce DC in February, pt has had PPM placement due to bradycardia issues. Pt also started using an UpWalker device 2 week prior to this evaluation, goal was to help with gradual melting of postural extension that occured with sustained upright actiivty. Pt has continued to practice supervised AMB with caregivers, short distances out of house and in  community. Pt continues to experience frequent falls on a weekly basis, at a rate that both he and wife report to be increasing in frequency. PMH: HTN, bradycardia s/p PPM, PD, DBS, COVID-19 infection, chronic low back pain s/p L4/5 surgery.    Limitations Walking;Standing;Sitting;House hold activities;Lifting    How long can you sit comfortably? Not limited    How long can you stand comfortably? 5 minutes with support    How long can you walk comfortably? ~5-6 minutes UpWalker Walker    Patient Stated Goals Reduce falls, improve AMB tolerance    Currently in Pain? No/denies    Pain Onset More than a month ago    Pain Onset More than a month ago            INTERVENTIONS:   Neuromuscular re-education: Unless otherwise stated, CGA was provided and gait belt donned in order to ensure pt safety The following activities were completed in parallel bars or at balance bar   Stair taps on airex pad  On airex pad to 12 inch step  2 x 10 ea - B UE assist   -cues for quick and deliberate movement to improve movement initiation, second set pt had some difficulty with R LE foot clearance  Toe taps to 6 inch step x 10 ea - no UE assist, cues for maintenance of trunk stability (tends to lean to left lateraly when on R LE SLS.  Toe taps no UE support 2 x 10 on ea LE, second set pt performed without alternating and cues required in order to Improve  posture and safety   R LE balance and stability  training:  R LE on airex pad L LE on 6 inch step 3 x 30 seconds, cues for maintaining posture (tends to flex forward into hip and trunk flexion)  -improved postural activation with short walk to chair following with no U suppport    Obstacle course  -airex, hurdle,4 inch step  step sidestepping to improve lateral stability and strength 2 sets  x 5 laps through  -good sequencing, B UE support, good step length with cueing- with fatigue reverts to hip flexion and some hip rotation in comparison to hip  abductor dominant movement for side steps   Ther ACT   Walking x 4 lengths in hallway with stepping game -round 1 and 2: 30 steps, round 3 and 4: 28 steps and increased gait speed noted -from door to door in long hallway.  -feedback for step length provided via therapist counting steps out loud (pt would know based on counting if shuffling or he was taking small steps)    Sit to stand- Good technique with less need for VC this session, good knee extension upon standing compared to previous sessions.   -VC for reaching back with UE to prevent uncontrolled descent in settings outside PT  -2 x 10 reps  PT Education - 05/12/21 1238     Education provided Yes    Education Details Exercise form and technique    Person(s) Educated Patient    Methods Explanation    Comprehension Verbalized understanding              PT Short Term Goals - 04/14/21 1120       PT SHORT TERM GOAL #1   Title Pt to demonstrate improved sustained AMB tolerance >84ft.    Baseline At evlauation: 658ft tolerance (not to failure); 9/26: 1384 ft    Time 4    Period Weeks    Status Achieved    Target Date 01/05/21      PT SHORT TERM GOAL #2   Title Pt will improve BERG balance score to greater than 45 to indicate decrease risk of falls.    Baseline Eval: 15/28; 9/26: deferred, 03/08/21: 15/28    Time 4    Period Weeks    Status On-going    Target Date 05/12/21      PT SHORT TERM GOAL #3   Title Pt to demonstrate 10MWT<13sec to show self-selected gait speed appropriate for AMB outside of house and to improve energy efficiency.    Baseline UpWalker Eval: 17.53sec; 9/26: 0.74 m/s (13.5)11/21: 13.68 (.73 m/s) 12.64 on 12/28    Time 4    Period Weeks    Status Achieved    Target Date 02/08/21               PT Long Term Goals - 04/14/21 1458       PT LONG TERM GOAL #1   Title Pt will improve BERG balance score to  greater than 47 to indicate decreaed risk of falls.    Baseline 41 on 12/28    Time 8    Period Weeks    Status On-going    Target Date 05/31/21      PT LONG TERM GOAL #2   Title Pt to tolerate 1074ft sustained AMB with UpWalker without rest interval to improve safety and independence in limited community AMB.    Baseline At eval; tired after 646ft; 9/26: 1384 ft    Time 8    Period Weeks    Status Achieved    Target Date 05/03/21      PT LONG TERM GOAL #3   Title Pt to demonstrate improved FOTO score by 5 points compared to initital assessment.    Baseline 9/26: 49 (eval was 50) 911/21: 52 12/28 deferred as wife not present and pt not best historian    Time 8    Period Weeks    Status On-going    Target Date 05/03/21      PT LONG TERM GOAL #4   Title Pt to show tolerance of AMB>1368ft with UpWalker to improve tolerance to accessing the community for IADL.    Baseline Eval: 61ft;  9/26: 1385    Time 12    Period Weeks    Status Achieved    Target Date 05/31/21      PT LONG TERM GOAL #5   Title Patient will increase six minute walk test distance to >1000 for progression to community ambulator and improve gait ability    Baseline 9/26: new 11/21: 850 feet with up Walker 12/28: 925 feet with up walker    Time 8    Period Weeks    Status On-going    Target Date 06/09/21  Plan - 05/12/21 0957     Clinical Impression Statement Patient presents with excellent motivation for completion of physical therapy program. Patient able to tolerate several standing activities working on static and dynamic balance in today's session. Incorporated R LE stability exercises into session with frequent cues for posture to prevent flexed psoture. Also incorporated game for step length and pt responded well with improved step length and gait speed.  Patient will continue to benefit from skilled physical therapy intervention in order to improve his lower extremity strength,  improve balance, prevent falls, and improve his overall quality of life.    Personal Factors and Comorbidities Age;Past/Current Experience;Time since onset of injury/illness/exacerbation;Transportation    Comorbidities Chronic back pain, orthostatic hypotension, repeated falls, parkinsonism    Examination-Activity Limitations Bathing;Bed Mobility;Caring for Others;Bend;Dressing;Lift;Locomotion Level;Reach Overhead;Sit;Squat;Stairs;Stand;Toileting;Transfers;Carry    Examination-Participation Restrictions Cleaning;Community Activity;Interpersonal Relationship;Shop;Volunteer;Yard Work;Other;Driving    Stability/Clinical Decision Making Evolving/Moderate complexity    Rehab Potential Fair    PT Frequency 2x / week    PT Duration 12 weeks    PT Treatment/Interventions ADLs/Self Care Home Management;Aquatic Therapy;Cryotherapy;Electrical Stimulation;Iontophoresis 4mg /ml Dexamethasone;Moist Heat;Ultrasound;Contrast Bath;DME Instruction;Gait training;Stair training;Functional mobility training;Therapeutic activities;Therapeutic exercise;Balance training;Neuromuscular re-education;Patient/family education;Manual techniques;Wheelchair mobility training;Energy conservation;Passive range of motion;Traction;Orthotic Fit/Training;Dry needling;Vestibular;Visual/perceptual remediation/compensation;Taping;Canalith Repostioning;Cognitive remediation    PT Next Visit Plan Progress static and dynamic balance and continue with caregiver/patient ed with low back stretching    PT Home Exercise Plan None set up at evaluation, still working on previous HEP from 6 months prior (STS, daily walking); 8/29: issued STS with focus on large amplitude movement at support surface; 9/12: PT provides handout with sit to stand exercise and large amplitude seated trunk rotation exercise.  9/19: reinforced HEP, provided handout for seated postural exercise with UE abduction, and seated exercise with trunk twists and UE reach, 10x for each, can  be performed daily or minimum of 4x/week. No changes today.    Consulted and Agree with Plan of Care Patient;Family member/caregiver    Family Member Consulted Wife             Patient will benefit from skilled therapeutic intervention in order to improve the following deficits and impairments:  Abnormal gait, Decreased balance, Decreased coordination, Decreased mobility, Postural dysfunction, Decreased strength, Decreased safety awareness, Improper body mechanics, Impaired flexibility, Decreased activity tolerance, Decreased endurance, Decreased knowledge of precautions, Difficulty walking, Pain, Cardiopulmonary status limiting activity, Decreased range of motion, Impaired perceived functional ability, Decreased cognition, Dizziness  Visit Diagnosis: Abnormality of gait and mobility  Difficulty in walking, not elsewhere classified  Unsteadiness on feet  Repeated falls     Problem List Patient Active Problem List   Diagnosis Date Noted   Sinus drainage 12/09/2020   Medicare annual wellness visit, subsequent 11/18/2020   Pacemaker    Dementia associated with Parkinson's disease ([place]) 06/17/2020   Vertigo 02/19/2020   Urine abnormality 02/19/2020   Dysphagia 02/23/2019   Fall at home 10/18/2017   REM behavioral disorder 11/02/2016   Radicular pain in right arm 10/09/2016   GERD (gastroesophageal reflux disease) 07/31/2016   Colon cancer screening 07/23/2015   Gout 02/18/2015   Depression 01/06/2015   Skin lesion 10/02/2014   Dupuytren's contracture 08/20/2014   Cough 07/21/2014   Lumbar stenosis with neurogenic claudication 06/13/2014   S/P deep brain stimulator placement 05/08/2014   Advance care planning 01/19/2014   Parkinson's disease ([place]) 12/13/2013   PTSD (post-traumatic stress disorder) 06/13/2013   Erectile dysfunction 06/13/2013   HLD (hyperlipidemia) 06/13/2013   Essential hypertension 06/03/2013  Bradycardia by electrocardiogram 06/03/2013   Obstructive  sleep apnea 03/12/2013    Particia Lather, PT 05/12/2021, 12:46 PM  [place] MAIN [facility] SERVICES [address] [place], [place], [address] Phone: [phone]   Fax:  [phone]  Name: [name] MRN: [id number] Date of Birth: October 28, 1950

## 2021-05-13 NOTE — Addendum Note (Signed)
Addended by: Darleene Cleaver on: 05/13/2021 09:05 AM   Modules accepted: Orders

## 2021-05-13 NOTE — Therapy (Signed)
[place] MAIN [facility] SERVICES [address] [place], [place], [address] Phone: [phone]   Fax:  [phone]  Occupational Therapy Evaluation  Patient Details  Name: [name] MRN: [id number] Date of Birth: 11/19/1950 Referring Provider (OT): Dr. Sharolyn Douglas (neurologist at [facility])   Encounter Date: 05/12/2021   OT End of Session - 05/13/21 0830     Visit Number 1    Number of Visits 24    Date for OT Re-Evaluation 08/03/21    OT Start Time 1000    OT Stop Time 1049    OT Time Calculation (min) 49 min    Equipment Utilized During Treatment rollator    Activity Tolerance Patient tolerated treatment well    Behavior During Therapy [facility] for tasks assessed/performed             Past Medical History:  Diagnosis Date   Arthritis    Bradycardia    Cancer ([place]) 2013   skin cancer   Depression    ptsd   Dysrhythmia    chronic slow heart rate   GERD (gastroesophageal reflux disease)    Headache(784.0)    tension headaches non recent   History of chicken pox    History of kidney stones    passed   Hypertension    treated with HCTZ   Pacemaker    Parkinson's disease ([place])    dx'ed 15 years ago   PTSD (post-traumatic stress disorder)    Shortness of breath dyspnea    Sleep apnea    doesn't use C-pap   Varicose veins     Past Surgical History:  Procedure Laterality Date   CHOLECYSTECTOMY N/A 10/22/2014   Procedure: LAPAROSCOPIC CHOLECYSTECTOMY WITH INTRAOPERATIVE CHOLANGIOGRAM;  Surgeon: Dia Crawford III, MD;  Location: [facility] ORS;  Service: General;  Laterality: N/A;   cyst removed      from lip as a child   INTRAMEDULLARY (IM) NAIL INTERTROCHANTERIC N/A 02/18/2019   Procedure: INTRAMEDULLARY (IM) NAIL INTERTROCHANTRIC, RIGHT,;  Surgeon: Thornton Park, MD;  Location: [facility] ORS;  Service: Orthopedics;  Laterality: N/A;   LUMBAR LAMINECTOMY/DECOMPRESSION MICRODISCECTOMY Bilateral 12/14/2012   Procedure: Bilateral lumbar  three-four, four-five decompressive laminotomy/foraminotomy;  Surgeon: Charlie Pitter, MD;  Location: [place] NEURO ORS;  Service: Neurosurgery;  Laterality: Bilateral;   PULSE GENERATOR IMPLANT Bilateral 12/13/2013   Procedure: Bilateral implantable pulse generator placement;  Surgeon: Erline Levine, MD;  Location: [place] NEURO ORS;  Service: Neurosurgery;  Laterality: Bilateral;  Bilateral implantable pulse generator placement   skin cancer removed     from ears,   12 lft arm  rt leg 15   SUBTHALAMIC STIMULATOR BATTERY REPLACEMENT Bilateral 07/14/2017   Procedure: BILATERAL IMPLANTED PULSE GENERATOR CHANGE FOR DEEP BRAIN STIMULATOR;  Surgeon: Erline Levine, MD;  Location: [place];  Service: Neurosurgery;  Laterality: Bilateral;   SUBTHALAMIC STIMULATOR INSERTION Bilateral 12/06/2013   Procedure: SUBTHALAMIC STIMULATOR INSERTION;  Surgeon: Erline Levine, MD;  Location: [place] NEURO ORS;  Service: Neurosurgery;  Laterality: Bilateral;  Bilateral deep brain stimulator placement    There were no vitals filed for this visit.   Subjective Assessment - 05/12/21 1131     Subjective  "He doesn't like OT, but he needs it.  He needs to work on his coordination." (per spouse, [name])    Patient is accompanied by: Family member    Pertinent History Parkinson's Disease Diagnosed in 2000, frequent falls    Limitations UE/LE tremors, decreased balance, lack of coordination, intermittent diplopia (has prism  glasses)    Patient Stated Goals Pt/spouse verbalize desire to improve [facility] skills for holding playing cards, and improving shoulder flexibility and posture for better walker use.    Currently in Pain? No/denies    Pain Onset More than a month ago    Pain Onset More than a month ago               [facility] OT Assessment - 05/13/21 0001       Assessment   Medical Diagnosis Parkinson's Disease    Referring Provider (OT) Dr. Sharolyn Douglas (neurologist at [facility])    Onset Date/Surgical Date --   Diagnosed in 2000   Hand  Dominance Right    [facility], neurologist in April (just saw last week)    Prior Therapy yes, outpatient OT finished ~1 yr ago      Precautions   Precautions Fall   orthostatic hypotension     Restrictions   Weight Bearing Restrictions No      Balance Screen   Has the patient fallen in the past 6 months Yes    How many times? <10    Has the patient had a decrease in activity level because of a fear of falling?  No    Is the patient reluctant to leave their home because of a fear of falling?  No      Home  Environment   Family/patient expects to be discharged to: Private residence    Living Arrangements Spouse/significant other    Available Help at Discharge Family   pt has caregiver through the [place] 16 hours/week (4 days a week), otherwise spouse is primary caregiver   Type of [place] entrance    Home Layout One level    Bathroom Shower/Tub [place] height    Bathroom Accessibility Yes    How accessible Other (Comment)   fully handicapped done by the [employer] equipment Feeding equipment   pt has dycem placemats and long handled silverware from the [employer] - 4 wheels;Shower seat;Toilet riser;Grab bars - toilet;Grab bars - tub/shower;Hand held shower head    Lives With Spouse      Prior Function   Level of Independence Needs assistance with ADLs;Needs assistance with homemaking;Needs assistance with gait;Needs assistance with transfers    Vocation Retired   from the [employer] and [place] watching movies, going to church, playing cards      ADL   Eating/Feeding Set up   cuts food with extra time, uses long handled silverware, dycem placemats, and large tray with ridge when eating in chair; frequent spilling with liquids   Grooming Minimal assistance   pt uses electric toothbrush and can apply toothpaste to brush; spouse trims facial hair, pt comes hair with min A for  thoroughness (routinely misses 1 spot near back of head)   Upper Body Bathing Supervision/safety    Lower Body Bathing Supervision/safety    Upper Body Dressing Set up   assist with clothing fasteners   Lower Body Dressing Minimal assistance   occasional assist to don socks; pt uses elastic shoelaces   Toilet Transfer Supervision/safety    Toileting -  Hygiene Use of adaptive device;Minimal assistance   pt has bidet at home; spouse occasionally assists with thoroughness   Tub/Shower Transfer Min guard    Transfers/Ambulation Related to ADL's min guard/supv  for functional transfers in the home      IADL   Medication Management Is not capable of dispensing or managing own medication   spouse manages all IADLs     Mobility   Mobility Status History of falls    Mobility Status Comments pt uses a stand up rollator, but poor posture limits pt's ability to keep feet within back 2 legs of walker      Vision - History   Baseline Vision Wears glasses all the time   pt has reading glasses and prism glasses     Vision Assessment   Comment dry eyes   has prism glasses for occasional double vision     Cognition   Overall Cognitive Status History of cognitive impairments - at baseline   oriented to person, place, year (not month)   Memory Decreased short-term memory      Observation/Other Assessments   Focus on Therapeutic Outcomes (FOTO)  34      Posture/Postural Control   Postural Limitations Rounded Shoulders    Posture Comments Pt leans into walker with feet behind back 2 legs of walker      Coordination   Gross Motor Movements are Fluid and Coordinated No    Fine Motor Movements are Fluid and Coordinated No    Coordination and Movement Description intermittent mild UE tremors, mild bradykinesia in BUEs    Right 9 Hole Peg Test 41 sec    Left 9 Hole Peg Test 41 sec    Tremors mild UE tremors      AROM   Overall AROM Comments active R shoulder flex 0-110, L 0-120; R shoulder abd 0-124,  L 0-165      Strength   Overall Strength Comments BUEs grossly 5/5      Hand Function   Right Hand Grip (lbs) 59    Right Hand Lateral Pinch 16 lbs    Right Hand 3 Point Pinch 11 lbs    Left Hand Grip (lbs) 64    Left Hand Lateral Pinch 16 lbs    Left 3 point pinch 13 lbs            Occupational Therapy Evaluation: Pt is a 71 y/o male, who presents to Occupational Therapy d/t functional decline related to Parkinson's.  Pt is accompanied by spouse, [name], who provides most of pt's hx.  Pt last received OT services ending in mid February of 2022, and spouse reports need to revisit exercises for UE coordination, shoulder flexibility, and various ADL tasks, including self feeding, use of phone, and improving posture for better walker use; see note for details re: current functional status.  Pt will benefit from skilled OT to address bilat shoulder flexibility, postural deficits, bilat hand strength and coordination skills, ADL training with instruction in adapted/modified methods as needed, and instruction in HEP.  Pt/spouse in agreement with plan.        OT Education - 05/13/21 0829     Education Details OT role, goals, poc    Person(s) Educated Patient;Spouse    Methods Explanation;Verbal cues    Comprehension Verbalized understanding              OT Short Term Goals - 05/13/21 0834       OT SHORT TERM GOAL #1   Title Pt/caregiver will be indep with HEP    Baseline Eval: not yet initiated    Time 6    Period Weeks    Status New    Target  Date 06/22/21               OT Long Term Goals - 05/13/21 0836       OT LONG TERM GOAL #1   Title Pt will improve FOTO score to 40 or better to indicate increased functional improvement.    Baseline Eval: FOTO 34    Time 12    Period Weeks    Status New    Target Date 08/03/21      OT LONG TERM GOAL #2   Title Pt will improve R lateral pinch strength to be able to tolerate holding playing cards in R dominant hand for  game of cards.    Baseline Eval: R lateral pinch 16 lbs; pt enjoys playing cards but is not currently playing d/t decreased strength/coordination    Time 12    Period Weeks    Status New    Target Date 08/03/21      OT LONG TERM GOAL #3   Title Pt will improve R dominant hand to mouth coordination to self feed with minimal spilling and utilize adapted utensils as needed.    Baseline Eval: Spouse reports eating is messy and pt struggles to keep utensil level.    Time 12    Period Weeks    Status New    Target Date 08/03/21      OT LONG TERM GOAL #4   Title Pt will improve bilat shoulder flex/abd for improved overhead reach during self care.    Baseline Eval: R shoulder flex 110, L 120, R abd 125, L 165    Time 12    Period Weeks    Status New    Target Date 08/03/21      OT LONG TERM GOAL #5   Title Pt will improve [place] in bilat hands as noted by improvement in 9 hole peg test score by 5 or more seconds to enable participate in family board games.    Baseline Eval: R/L 9 hole peg test 41 secs (pt has not recently participated in board games)    Time 12    Period Weeks    Status New    Target Date 08/03/21      Long Term Additional Goals   Additional Long Term Goals Yes      OT LONG TERM GOAL #6   Title Pt will be able to initiate simple texts and reply to texts on phone with min vc.    Baseline Eval: Pt struggles with texting    Time 12    Period Weeks    Status New    Target Date 08/03/21      OT LONG TERM GOAL #7   Title Pt will improve posture for ADLs and functional mobility as demonstrated by ability to keep feet within back two legs of rollator 50% of the time with min vc.    Baseline Eval: pt with forward lean and feet are behind back 2 legs of walker during functional mobility.    Time 12    Status New    Target Date 08/03/21             Plan - 05/13/21 0901     Clinical Impression Statement Pt is a 71 y/o male, who presents to Occupational Therapy d/t  functional decline related to Parkinson's.  Pt is accompanied by spouse, [name], who provides most of pt's hx.  Pt last received OT services ending in mid February of 2022, and spouse  reports need to revisit exercises for UE coordination, shoulder flexibility, and various ADL tasks, including self feeding, use of phone, and improving posture for better walker use; see note for details re: current functional status.  Pt will benefit from skilled OT to address bilat shoulder flexibility, postural deficits, bilat hand strength and coordination skills, ADL training with instruction in adapted/modified methods as needed, and instruction in HEP.  Pt/spouse in agreement with plan.    OT Occupational Profile and History Problem Focused Assessment - Including review of records relating to presenting problem    Occupational performance deficits (Please refer to evaluation for details): ADL's;IADL's;Leisure    Body Structure / Function / Physical Skills ADL;Coordination;GMC;UE functional use;Balance;Body mechanics;Decreased knowledge of use of DME;Flexibility;Dexterity;FMC;Strength;ROM    Cognitive Skills Attention;Memory    Rehab Potential Good    Clinical Decision Making Several treatment options, min-mod task modification necessary    Comorbidities Affecting Occupational Performance: May have comorbidities impacting occupational performance    Modification or Assistance to Complete Evaluation  No modification of tasks or assist necessary to complete eval    OT Frequency 2x / week    OT Duration 12 weeks    OT Treatment/Interventions Self-care/ADL training;Therapeutic exercise;DME and/or AE instruction;Functional Mobility Training;Cognitive remediation/compensation;Balance training;Neuromuscular education;Manual Therapy;Moist Heat;Energy conservation;Passive range of motion;Therapeutic activities;Patient/family education    OT Home Exercise Plan not yet initiated at eval    Consulted and Agree with Plan of Care  Patient;Family member/caregiver    Family Member Consulted spouse, [name]             Patient will benefit from skilled therapeutic intervention in order to improve the following deficits and impairments:   Body Structure / Function / Physical Skills: ADL, Coordination, GMC, UE functional use, Balance, Body mechanics, Decreased knowledge of use of DME, Flexibility, Dexterity, FMC, Strength, ROM Cognitive Skills: Attention, Memory     Visit Diagnosis: Muscle weakness (generalized)  Other lack of coordination  Parkinson's disease ([place])    Problem List Patient Active Problem List   Diagnosis Date Noted   Sinus drainage 12/09/2020   Medicare annual wellness visit, subsequent 11/18/2020   Pacemaker    Dementia associated with Parkinson's disease ([place]) 06/17/2020   Vertigo 02/19/2020   Urine abnormality 02/19/2020   Dysphagia 02/23/2019   Fall at home 10/18/2017   REM behavioral disorder 11/02/2016   Radicular pain in right arm 10/09/2016   GERD (gastroesophageal reflux disease) 07/31/2016   Colon cancer screening 07/23/2015   Gout 02/18/2015   Depression 01/06/2015   Skin lesion 10/02/2014   Dupuytren's contracture 08/20/2014   Cough 07/21/2014   Lumbar stenosis with neurogenic claudication 06/13/2014   S/P deep brain stimulator placement 05/08/2014   Advance care planning 01/19/2014   Parkinson's disease ([place]) 12/13/2013   PTSD (post-traumatic stress disorder) 06/13/2013   Erectile dysfunction 06/13/2013   HLD (hyperlipidemia) 06/13/2013   Essential hypertension 06/03/2013   Bradycardia by electrocardiogram 06/03/2013   Obstructive sleep apnea 03/12/2013   Leta Speller, MS, OTR/L  Darleene Cleaver, OT 05/13/2021, 9:01 AM  [place] MAIN [facility] SERVICES [address] [place], [place], [address] Phone: [phone]   Fax:  [phone]  Name: [name] MRN: [id number] Date of Birth: [birth year]

## 2021-05-17 ENCOUNTER — Encounter: Payer: Medicare PPO | Admitting: Speech Pathology

## 2021-05-17 ENCOUNTER — Other Ambulatory Visit: Payer: Self-pay

## 2021-05-17 ENCOUNTER — Ambulatory Visit: Payer: Medicare PPO

## 2021-05-17 ENCOUNTER — Ambulatory Visit: Payer: Medicare PPO | Admitting: Physical Therapy

## 2021-05-17 DIAGNOSIS — R296 Repeated falls: Secondary | ICD-10-CM | POA: Diagnosis not present

## 2021-05-17 DIAGNOSIS — R269 Unspecified abnormalities of gait and mobility: Secondary | ICD-10-CM

## 2021-05-17 DIAGNOSIS — M6281 Muscle weakness (generalized): Secondary | ICD-10-CM

## 2021-05-17 DIAGNOSIS — R262 Difficulty in walking, not elsewhere classified: Secondary | ICD-10-CM

## 2021-05-17 DIAGNOSIS — R278 Other lack of coordination: Secondary | ICD-10-CM

## 2021-05-17 DIAGNOSIS — R2681 Unsteadiness on feet: Secondary | ICD-10-CM

## 2021-05-17 DIAGNOSIS — G20 Parkinson's disease: Secondary | ICD-10-CM | POA: Diagnosis not present

## 2021-05-17 NOTE — Therapy (Signed)
[place] MAIN [facility] SERVICES [address] [place], [place], [address] Phone: [phone]   Fax:  [phone]  Physical Therapy Treatment  Patient Details  Name: [name] MRN: [id number] Date of Birth: 1951-03-20 Referring Provider ([name]): Elsie Stain, MD   Encounter Date: 05/17/2021   [name] End of Session - 05/17/21 1106     Visit Number 37    Number of Visits 48    Date for [name] Re-Evaluation 05/31/21    Authorization Type [employer] Choice PPO    Authorization Time Period recert for 6/96-29/52    Progress Note Due on Visit 40    [name] Start Time 1100    [name] Stop Time 1142    [name] Time Calculation (min) 42 min    Equipment Utilized During Treatment Gait belt    Activity Tolerance Patient tolerated treatment well    Behavior During Therapy WFL for tasks assessed/performed             Past Medical History:  Diagnosis Date   Arthritis    Bradycardia    Cancer ([place]) 2013   skin cancer   Depression    ptsd   Dysrhythmia    chronic slow heart rate   GERD (gastroesophageal reflux disease)    Headache(784.0)    tension headaches non recent   History of chicken pox    History of kidney stones    passed   Hypertension    treated with HCTZ   Pacemaker    Parkinson's disease ([place])    dx'ed 15 years ago   PTSD (post-traumatic stress disorder)    Shortness of breath dyspnea    Sleep apnea    doesn't use C-pap   Varicose veins     Past Surgical History:  Procedure Laterality Date   CHOLECYSTECTOMY N/A 10/22/2014   Procedure: LAPAROSCOPIC CHOLECYSTECTOMY WITH INTRAOPERATIVE CHOLANGIOGRAM;  Surgeon: Dia Crawford III, MD;  Location: [facility] ORS;  Service: General;  Laterality: N/A;   cyst removed      from lip as a child   INTRAMEDULLARY (IM) NAIL INTERTROCHANTERIC N/A 02/18/2019   Procedure: INTRAMEDULLARY (IM) NAIL INTERTROCHANTRIC, RIGHT,;  Surgeon: Thornton Park, MD;  Location: [facility] ORS;  Service: Orthopedics;  Laterality:  N/A;   LUMBAR LAMINECTOMY/DECOMPRESSION MICRODISCECTOMY Bilateral 12/14/2012   Procedure: Bilateral lumbar three-four, four-five decompressive laminotomy/foraminotomy;  Surgeon: Charlie Pitter, MD;  Location: [facility] ORS;  Service: Neurosurgery;  Laterality: Bilateral;   PULSE GENERATOR IMPLANT Bilateral 12/13/2013   Procedure: Bilateral implantable pulse generator placement;  Surgeon: Erline Levine, MD;  Location: [place] [facility]URO ORS;  Service: Neurosurgery;  Laterality: Bilateral;  Bilateral implantable pulse generator placement   skin cancer removed     from ears,   12 lft arm  rt leg 15   SUBTHALAMIC STIMULATOR BATTERY REPLACEMENT Bilateral 07/14/2017   Procedure: BILATERAL IMPLANTED PULSE GENERATOR CHANGE FOR DEEP BRAIN STIMULATOR;  Surgeon: Erline Levine, MD;  Location: [place];  Service: Neurosurgery;  Laterality: Bilateral;   SUBTHALAMIC STIMULATOR INSERTION Bilateral 12/06/2013   Procedure: SUBTHALAMIC STIMULATOR INSERTION;  Surgeon: Erline Levine, MD;  Location: [place] NEURO ORS;  Service: Neurosurgery;  Laterality: Bilateral;  Bilateral deep brain stimulator placement    There were no vitals filed for this visit.   Subjective Assessment - 05/17/21 1101     [name] one fall since last sesion but overall he and his wife had a very rough weekend. he reports one death in the family and another injury to a family  member which required hospitilization. [name] reports no injury from the fall.    Patient is accompained by: Family member    Pertinent History [name] is a 80yoM who comes to [facility] neuro for evaluation of conitnued difficulty of imbalance, unsteadiness on feet, and generally limited tolerance to mobility in the setting of chronic parkinsonism. SInce DC in February, [name] has had PPM placement due to bradycardia issues. [name] also started using an UpWalker device 2 week prior to this evaluation, goal was to help with gradual melting of postural extension that occured with sustained upright  actiivty. [name] has continued to practice supervised AMB with caregivers, short distances out of house and in community. [name] continues to experience frequent falls on a weekly basis, at a rate that both he and wife report to be increasing in frequency. PMH: HTN, bradycardia s/p PPM, PD, DBS, COVID-19 infection, chronic low back pain s/p L4/5 surgery.    Limitations Walking;Standing;Sitting;House hold activities;Lifting    How long can you sit comfortably? Not limited    How long can you stand comfortably? 5 minutes with support    How long can you walk comfortably? ~5-6 minutes UpWalker Walker    Patient Stated Goals Reduce falls, improve AMB tolerance    Pain Onset More than a month ago    Pain Onset More than a month ago             INTERVENTIONS:   Neuromuscular re-education: Unless otherwise stated, CGA was provided and gait belt donned in order to ensure [name] safety The following activities were completed in parallel bars or at balance bar   Stair taps on airex pad  On airex pad to 12 inch step  2 x 10 ea - B UE assist   -cues for quick and deliberate movement to improve movement initiation, second set [name] had some difficulty with R LE foot clearance   Toe taps to 6 inch step x 10 ea - no UE assist, cues for maintenance of trunk stability (tends to lean to left lateraly when on R LE SLS.    R LE balance and stability  training:  R LE on airex pad L LE on 6 inch step 3 x 30 seconds, cues for maintaining posture (tends to flex forward into hip and trunk flexion)  -improved postural activation with short walk to chair following with no UE suppport      Ther ACT   Walking x 4 lengths in hallway with stepping game -round 1 and 2: 27 steps, round 3 and 4: 26 steps and increased gait speed noted -from door to corner in long hallway.  -feedback for step length provided via therapist counting steps out loud ([name] would know based on counting if shuffling or he was taking small steps)   Walking track in gym- improved step length and gait speed with practice and feedback (of results and task)  42, 38,36  Sit to stand - Airex pad under feet - Good technique with less need for VC this session -VC for reaching back with UE to prevent uncontrolled descent in settings outside [name]  -2 x 10 reps     Therex  HS stretch seated 2 x 35 sec ea  Ball roll outs ( lumbar stretch) 3 way 10 x 5 sec ea                          [name] Education - 05/17/21 1105  Education provided Yes    Education Details Exercise form and technique    Person(s) Educated Patient    Methods Explanation    Comprehension Verbalized understanding              [name] Short Term Goals - 04/14/21 1120       [name] SHORT TERM GOAL #1   Title [name] to demonstrate improved sustained AMB tolerance >849ft.    Baseline At evlauation: 644ft tolerance (not to failure); 9/26: 1384 ft    Time 4    Period Weeks    Status Achieved    Target Date 01/05/21      [name] SHORT TERM GOAL #2   Title [name] will improve BERG balance score to greater than 45 to indicate decrease risk of falls.    Baseline Eval: 15/28; 9/26: deferred, 03/08/21: 15/28    Time 4    Period Weeks    Status On-going    Target Date 05/12/21      [name] SHORT TERM GOAL #3   Title [name] to demonstrate 10MWT<13sec to show self-selected gait speed appropriate for AMB outside of house and to improve energy efficiency.    Baseline UpWalker Eval: 17.53sec; 9/26: 0.74 m/s (13.5)11/21: 13.68 (.73 m/s) 12.64 on 12/28    Time 4    Period Weeks    Status Achieved    Target Date 02/08/21               [name] Long Term Goals - 04/14/21 1458       [name] LONG TERM GOAL #1   Title [name] will improve BERG balance score to greater than 47 to indicate decreaed risk of falls.    Baseline 41 on 12/28    Time 8    Period Weeks    Status On-going    Target Date 05/31/21      [name] LONG TERM GOAL #2   Title [name] to tolerate 1039ft sustained AMB with UpWalker  without rest interval to improve safety and independence in limited community AMB.    Baseline At eval; tired after 636ft; 9/26: 1384 ft    Time 8    Period Weeks    Status Achieved    Target Date 05/03/21      [name] LONG TERM GOAL #3   Title [name] to demonstrate improved FOTO score by 5 points compared to initital assessment.    Baseline 9/26: 49 (eval was 50) 911/21: 52 12/28 deferred as wife not present and [name] not best historian    Time 8    Period Weeks    Status On-going    Target Date 05/03/21      [name] LONG TERM GOAL #4   Title [name] to show tolerance of AMB>1326ft with UpWalker to improve tolerance to accessing the community for IADL.    Baseline Eval: 647ft;  9/26: 1385    Time 12    Period Weeks    Status Achieved    Target Date 05/31/21      [name] LONG TERM GOAL #5   Title Patient will increase six minute walk test distance to >1000 for progression to community ambulator and improve gait ability    Baseline 9/26: new 11/21: 850 feet with up Walker 12/28: 925 feet with up walker    Time 8    Period Weeks    Status On-going    Target Date 06/09/21  Plan - 05/17/21 1106     Clinical Impression Statement Patient presents with excellent motivation for completion of physical therapy program. [name] having some low back pain [name] incorparated several exercises to address this discomfort. Patient able to tolerate several standing activities working on static and dynamic balance in today's session. Incorporated R LE stability exercises into session with frequent cues for posture to prevent flexed psoture. Also incorporated game for step length and [name] responded well with improved step length and gait speed. Patient will continue to benefit from skilled physical therapy intervention in order to improve his lower extremity strength, improve balance, prevent falls, and improve his overall quality of life.    Personal Factors and Comorbidities Age;Past/Current Experience;Time  since onset of injury/illness/exacerbation;Transportation    Comorbidities Chronic back pain, orthostatic hypotension, repeated falls, parkinsonism    Examination-Activity Limitations Bathing;Bed Mobility;Caring for Others;Bend;Dressing;Lift;Locomotion Level;Reach Overhead;Sit;Squat;Stairs;Stand;Toileting;Transfers;Carry    Examination-Participation Restrictions Cleaning;Community Activity;Interpersonal Relationship;Shop;Volunteer;Yard Work;Other;Driving    Stability/Clinical Decision Making Evolving/Moderate complexity    Rehab Potential Fair    [name] Frequency 2x / week    [name] Duration 12 weeks    [name] Treatment/Interventions ADLs/Self Care Home Management;Aquatic Therapy;Cryotherapy;Electrical Stimulation;Iontophoresis 4mg /ml Dexamethasone;Moist Heat;Ultrasound;Contrast Bath;DME Instruction;Gait training;Stair training;Functional mobility training;Therapeutic activities;Therapeutic exercise;Balance training;Neuromuscular re-education;Patient/family education;Manual techniques;Wheelchair mobility training;Energy conservation;Passive range of motion;Traction;Orthotic Fit/Training;Dry needling;Vestibular;Visual/perceptual remediation/compensation;Taping;Canalith Repostioning;Cognitive remediation    [name] Next Visit Plan Progress static and dynamic balance and continue with caregiver/patient ed with low back stretching    [name] Home Exercise Plan None set up at evaluation, still working on previous HEP from 6 months prior (STS, daily walking); 8/29: issued STS with focus on large amplitude movement at support surface; 9/12: [name] provides handout with sit to stand exercise and large amplitude seated trunk rotation exercise.  9/19: reinforced HEP, provided handout for seated postural exercise with UE abduction, and seated exercise with trunk twists and UE reach, 10x for each, can be performed daily or minimum of 4x/week. No changes today.    Consulted and Agree with Plan of Care Patient;Family member/caregiver    Family  Member Consulted Wife             Patient will benefit from skilled therapeutic intervention in order to improve the following deficits and impairments:  Abnormal gait, Decreased balance, Decreased coordination, Decreased mobility, Postural dysfunction, Decreased strength, Decreased safety awareness, Improper body mechanics, Impaired flexibility, Decreased activity tolerance, Decreased endurance, Decreased knowledge of precautions, Difficulty walking, Pain, Cardiopulmonary status limiting activity, Decreased range of motion, Impaired perceived functional ability, Decreased cognition, Dizziness  Visit Diagnosis: Abnormality of gait and mobility  Difficulty in walking, not elsewhere classified  Unsteadiness on feet  Repeated falls     Problem List Patient Active Problem List   Diagnosis Date Noted   Sinus drainage 12/09/2020   Medicare annual wellness visit, subsequent 11/18/2020   Pacemaker    Dementia associated with Parkinson's disease ([place]) 06/17/2020   Vertigo 02/19/2020   Urine abnormality 02/19/2020   Dysphagia 02/23/2019   Fall at home 10/18/2017   REM behavioral disorder 11/02/2016   Radicular pain in right arm 10/09/2016   GERD (gastroesophageal reflux disease) 07/31/2016   Colon cancer screening 07/23/2015   Gout 02/18/2015   Depression 01/06/2015   Skin lesion 10/02/2014   Dupuytren's contracture 08/20/2014   Cough 07/21/2014   Lumbar stenosis with neurogenic claudication 06/13/2014   S/P deep brain stimulator placement 05/08/2014   Advance care planning 01/19/2014   Parkinson's disease ([place]) 12/13/2013   PTSD (post-traumatic stress disorder) 06/13/2013   Erectile  dysfunction 06/13/2013   HLD (hyperlipidemia) 06/13/2013   Essential hypertension 06/03/2013   Bradycardia by electrocardiogram 06/03/2013   Obstructive sleep apnea 03/12/2013    [name], [name] 05/17/2021, 12:46 PM  [place] MAIN [facility]  SERVICES [address] [place], [place], [address] Phone: [phone]   Fax:  [phone]  Name: [name] MRN: [id number] Date of Birth: 09/09/1950

## 2021-05-18 NOTE — Therapy (Signed)
[place] MAIN [facility] SERVICES [address] [place], [place], [address] Phone: [phone]   Fax:  [phone]  Occupational Therapy Treatment  Patient Details  Name: [name] MRN: [id number] Date of Birth: [birth year] Referring Provider (OT): Dr. Sharolyn Douglas (neurologist at [facility])   Encounter Date: 05/17/2021   OT End of Session - 05/18/21 0824     Visit Number 2    Number of Visits 24    Date for OT Re-Evaluation 08/03/21    OT Start Time 1015    OT Stop Time 1100    OT Time Calculation (min) 45 min    Equipment Utilized During Treatment rollator    Activity Tolerance Patient tolerated treatment well    Behavior During Therapy [facility] for tasks assessed/performed             Past Medical History:  Diagnosis Date   Arthritis    Bradycardia    Cancer ([place]) 2013   skin cancer   Depression    ptsd   Dysrhythmia    chronic slow heart rate   GERD (gastroesophageal reflux disease)    Headache(784.0)    tension headaches non recent   History of chicken pox    History of kidney stones    passed   Hypertension    treated with HCTZ   Pacemaker    Parkinson's disease ([place])    dx'ed 15 years ago   PTSD (post-traumatic stress disorder)    Shortness of breath dyspnea    Sleep apnea    doesn't use C-pap   Varicose veins     Past Surgical History:  Procedure Laterality Date   CHOLECYSTECTOMY N/A 10/22/2014   Procedure: LAPAROSCOPIC CHOLECYSTECTOMY WITH INTRAOPERATIVE CHOLANGIOGRAM;  Surgeon: Dia Crawford III, MD;  Location: [facility] ORS;  Service: General;  Laterality: N/A;   cyst removed      from lip as a child   INTRAMEDULLARY (IM) NAIL INTERTROCHANTERIC N/A 02/18/2019   Procedure: INTRAMEDULLARY (IM) NAIL INTERTROCHANTRIC, RIGHT,;  Surgeon: Thornton Park, MD;  Location: [facility] ORS;  Service: Orthopedics;  Laterality: N/A;   LUMBAR LAMINECTOMY/DECOMPRESSION MICRODISCECTOMY Bilateral 12/14/2012   Procedure: Bilateral lumbar  three-four, four-five decompressive laminotomy/foraminotomy;  Surgeon: Charlie Pitter, MD;  Location: [place] [facility] ORS;  Service: Neurosurgery;  Laterality: Bilateral;   PULSE GENERATOR IMPLANT Bilateral 12/13/2013   Procedure: Bilateral implantable pulse generator placement;  Surgeon: Erline Levine, MD;  Location: [place] [facility] ORS;  Service: Neurosurgery;  Laterality: Bilateral;  Bilateral implantable pulse generator placement   skin cancer removed     from ears,   12 lft arm  rt leg 15   SUBTHALAMIC STIMULATOR BATTERY REPLACEMENT Bilateral 07/14/2017   Procedure: BILATERAL IMPLANTED PULSE GENERATOR CHANGE FOR DEEP BRAIN STIMULATOR;  Surgeon: Erline Levine, MD;  Location: [place];  Service: Neurosurgery;  Laterality: Bilateral;   SUBTHALAMIC STIMULATOR INSERTION Bilateral 12/06/2013   Procedure: SUBTHALAMIC STIMULATOR INSERTION;  Surgeon: Erline Levine, MD;  Location: [place] [facility] ORS;  Service: Neurosurgery;  Laterality: Bilateral;  Bilateral deep brain stimulator placement    There were no vitals filed for this visit.   Subjective Assessment - 05/17/21 0823     Subjective  Pt reported having a difficult weekend d/t a death in the family.    Patient is accompanied by: Family member    Pertinent History Parkinson's Disease Diagnosed in 2000, frequent falls    Limitations UE/LE tremors, decreased balance, lack of coordination, intermittent diplopia (has prism glasses)    Patient Stated Goals  Pt/spouse verbalize desire to improve [facility] skills for holding playing cards, and improving shoulder flexibility and posture for better walker use.    Currently in Pain? No/denies    Pain Score 0-No pain    Pain Onset More than a month ago    Pain Onset More than a month ago            Occupational Therapy Treatment: Self Care: Practiced texting on phone to send simple text communication to family members.  Pt with poor accuracy to hit keys with IF; OT recommended stylus with improved accuracy significantly.  Pt still  is inconsistent to tap button with stylus with sufficient force, and often taps key off midline, with OT providing cues to correct.  Instructed in short cut techniques with OT providing cues to use word anticipation on phone vs typing out an entire word with 50% pt carry over to utilize this short cut.   Therapeutic Exercise: Wall stretch completed with pt cued to stand with back and heels against wall, tuck chin working to push head posteriorly toward wall for erect standing posture.  OT provided manual push at shoulders to facilitate pec stretch/shoulder retraction.  Tolerated well.  In sitting, performed active bilat shoulder retraction x2 sets 10 reps each, cues for erect sitting posture to avoid slouch in chair.   Response to Treatment: See Plan/clinical impression below.     OT Education - 05/17/21 [phone]     Education Details texting strategies with cell phone to ease communication    Person(s) Educated Patient;Spouse    Methods Explanation;Verbal cues;Demonstration    Comprehension Verbalized understanding;Returned demonstration;Verbal cues required;Need further instruction              OT Short Term Goals - 05/13/21 0834       OT SHORT TERM GOAL #1   Title Pt/caregiver will be indep with HEP    Baseline Eval: not yet initiated    Time 6    Period Weeks    Status New    Target Date 06/22/21               OT Long Term Goals - 05/13/21 0836       OT LONG TERM GOAL #1   Title Pt will improve FOTO score to 40 or better to indicate increased functional improvement.    Baseline Eval: FOTO 34    Time 12    Period Weeks    Status New    Target Date 08/03/21      OT LONG TERM GOAL #2   Title Pt will improve R lateral pinch strength to be able to tolerate holding playing cards in R dominant hand for game of cards.    Baseline Eval: R lateral pinch 16 lbs; pt enjoys playing cards but is not currently playing d/t decreased strength/coordination    Time 12    Period  Weeks    Status New    Target Date 08/03/21      OT LONG TERM GOAL #3   Title Pt will improve R dominant hand to mouth coordination to self feed with minimal spilling and utilize adapted utensils as needed.    Baseline Eval: Spouse reports eating is messy and pt struggles to keep utensil level.    Time 12    Period Weeks    Status New    Target Date 08/03/21      OT LONG TERM GOAL #4   Title Pt will improve bilat shoulder flex/abd for  improved overhead reach during self care.    Baseline Eval: R shoulder flex 110, L 120, R abd 125, L 165    Time 12    Period Weeks    Status New    Target Date 08/03/21      OT LONG TERM GOAL #5   Title Pt will improve [place] in bilat hands as noted by improvement in 9 hole peg test score by 5 or more seconds to enable participate in family board games.    Baseline Eval: R/L 9 hole peg test 41 secs (pt has not recently participated in board games)    Time 12    Period Weeks    Status New    Target Date 08/03/21      Long Term Additional Goals   Additional Long Term Goals Yes      OT LONG TERM GOAL #6   Title Pt will be able to initiate simple texts and reply to texts on phone with min vc.    Baseline Eval: Pt struggles with texting    Time 12    Period Weeks    Status New    Target Date 08/03/21      OT LONG TERM GOAL #7   Title Pt will improve posture for ADLs and functional mobility as demonstrated by ability to keep feet within back two legs of rollator 50% of the time with min vc.    Baseline Eval: pt with forward lean and feet are behind back 2 legs of walker during functional mobility.    Time 12    Status New    Target Date 08/03/21              Plan - 05/17/21 0837     Clinical Impression Statement Practiced texting on phone to send simple text communication to family members.  Pt with poor accuracy to hit keys with IF; OT recommended stylus with improved accuracy significantly.  Pt still is inconsistent to tap button with  stylus with sufficient force, and often taps key off midline, with OT providing cues to correct.  Instructed in short cut techniques with OT providing cues to use word anticipation on phone vs typing out an entire word with 50% pt carry over to utilize this short cut.  Pt will continue to benefit from skilled OT for ADL training, compensatory ADL techniques, and therapeutic exercise to increase bilat hand coordination to better manipulate ADL supplies, and increase bilat shoulder flexibility and standing posture for overhead reach and functional mobility.    OT Occupational Profile and History Problem Focused Assessment - Including review of records relating to presenting problem    Occupational performance deficits (Please refer to evaluation for details): ADL's;IADL's;Leisure    Body Structure / Function / Physical Skills ADL;Coordination;GMC;UE functional use;Balance;Body mechanics;Decreased knowledge of use of DME;Flexibility;Dexterity;FMC;Strength;ROM    Cognitive Skills Attention;Memory    Rehab Potential Good    Clinical Decision Making Several treatment options, min-mod task modification necessary    Comorbidities Affecting Occupational Performance: May have comorbidities impacting occupational performance    Modification or Assistance to Complete Evaluation  No modification of tasks or assist necessary to complete eval    OT Frequency 2x / week    OT Duration 12 weeks    OT Treatment/Interventions Self-care/ADL training;Therapeutic exercise;DME and/or AE instruction;Functional Mobility Training;Cognitive remediation/compensation;Balance training;Neuromuscular education;Manual Therapy;Moist Heat;Energy conservation;Passive range of motion;Therapeutic activities;Patient/family education    OT Home Exercise Plan not yet initiated at eval    Consulted and  Agree with Plan of Care Patient;Family member/caregiver    Family Member Consulted spouse, [name]             Patient will benefit from  skilled therapeutic intervention in order to improve the following deficits and impairments:   Body Structure / Function / Physical Skills: ADL, Coordination, GMC, UE functional use, Balance, Body mechanics, Decreased knowledge of use of DME, Flexibility, Dexterity, FMC, Strength, ROM Cognitive Skills: Attention, Memory     Visit Diagnosis: Muscle weakness (generalized)  Other lack of coordination    Problem List Patient Active Problem List   Diagnosis Date Noted   Sinus drainage 12/09/2020   Medicare annual wellness visit, subsequent 11/18/2020   Pacemaker    Dementia associated with Parkinson's disease ([place]) 06/17/2020   Vertigo 02/19/2020   Urine abnormality 02/19/2020   Dysphagia 02/23/2019   Fall at home 10/18/2017   REM behavioral disorder 11/02/2016   Radicular pain in right arm 10/09/2016   GERD (gastroesophageal reflux disease) 07/31/2016   Colon cancer screening 07/23/2015   Gout 02/18/2015   Depression 01/06/2015   Skin lesion 10/02/2014   Dupuytren's contracture 08/20/2014   Cough 07/21/2014   Lumbar stenosis with neurogenic claudication 06/13/2014   S/P deep brain stimulator placement 05/08/2014   Advance care planning 01/19/2014   Parkinson's disease ([place]) 12/13/2013   PTSD (post-traumatic stress disorder) 06/13/2013   Erectile dysfunction 06/13/2013   HLD (hyperlipidemia) 06/13/2013   Essential hypertension 06/03/2013   Bradycardia by electrocardiogram 06/03/2013   Obstructive sleep apnea 03/12/2013   Leta Speller, MS, OTR/L  Darleene Cleaver, OT 05/18/2021, 8:40 AM  [place] MAIN [facility] SERVICES [address] [place], [place], [address] Phone: [phone]   Fax:  [phone]  Name: RODOLPH HAGEMANN MRN: [id number] Date of Birth: 02-07-1951

## 2021-05-19 ENCOUNTER — Encounter: Payer: Medicare PPO | Admitting: Speech Pathology

## 2021-05-19 ENCOUNTER — Encounter: Payer: Medicare PPO | Admitting: Occupational Therapy

## 2021-05-19 ENCOUNTER — Ambulatory Visit: Payer: Medicare PPO | Admitting: Physical Therapy

## 2021-05-19 ENCOUNTER — Other Ambulatory Visit: Payer: Self-pay

## 2021-05-20 ENCOUNTER — Ambulatory Visit (INDEPENDENT_AMBULATORY_CARE_PROVIDER_SITE_OTHER): Payer: Medicare PPO | Admitting: Dermatology

## 2021-05-20 DIAGNOSIS — L853 Xerosis cutis: Secondary | ICD-10-CM | POA: Diagnosis not present

## 2021-05-20 DIAGNOSIS — D2272 Melanocytic nevi of left lower limb, including hip: Secondary | ICD-10-CM | POA: Diagnosis not present

## 2021-05-20 DIAGNOSIS — L821 Other seborrheic keratosis: Secondary | ICD-10-CM | POA: Diagnosis not present

## 2021-05-20 DIAGNOSIS — D1800 Hemangioma unspecified site: Secondary | ICD-10-CM | POA: Diagnosis not present

## 2021-05-20 DIAGNOSIS — L814 Other melanin hyperpigmentation: Secondary | ICD-10-CM | POA: Diagnosis not present

## 2021-05-20 DIAGNOSIS — L578 Other skin changes due to chronic exposure to nonionizing radiation: Secondary | ICD-10-CM

## 2021-05-20 DIAGNOSIS — L820 Inflamed seborrheic keratosis: Secondary | ICD-10-CM

## 2021-05-20 DIAGNOSIS — D229 Melanocytic nevi, unspecified: Secondary | ICD-10-CM

## 2021-05-20 DIAGNOSIS — Z1283 Encounter for screening for malignant neoplasm of skin: Secondary | ICD-10-CM | POA: Diagnosis not present

## 2021-05-20 NOTE — Progress Notes (Signed)
New Patient Visit  Subjective  [name] is a 72 y.o. male who presents for the following: New Patient (Initial Visit) (New patient here regarding some spots at chest, shoulders, and arms he would like checked today. Reports that some spots have grown in size. He reports a history of precancers at face. ). The patient presents for upper Skin Exam upper body exam) for skin cancer screening and mole check.  The patient has spots, moles and lesions to be evaluated, some may be new or changing and the patient has concerns that these could be cancer.  The following portions of the chart were reviewed this encounter and updated as appropriate:   Tobacco   Allergies   Meds   Problems   Med Hx   Surg Hx   Fam Hx      Review of Systems:  No other skin or systemic complaints except as noted in HPI or Assessment and Plan.  Objective  Well appearing patient in no apparent distress; mood and affect are within normal limits.  All skin waist up examined.  right forearm x 1, left forearm x 1 , right chest x 2 (4) Erythematous stuck-on, waxy papule or plaque  left mid sole 0.6 cm flesh colored papule   Assessment & Plan  Inflamed seborrheic keratosis (4) right forearm x 1, left forearm x 1 , right chest x 2  Irritated   Destruction of lesion - right forearm x 1, left forearm x 1 , right chest x 2 Complexity: simple   Destruction method: cryotherapy   Informed consent: discussed and consent obtained   Timeout:  patient name, date of birth, surgical site, and procedure verified Lesion destroyed using liquid nitrogen: Yes   Region frozen until ice ball extended beyond lesion: Yes   Outcome: patient tolerated procedure well with no complications   Post-procedure details: wound care instructions given   Additional details:  Prior to procedure, discussed risks of blister formation, small wound, skin dyspigmentation, or rare scar following cryotherapy. Recommend Vaseline ointment to treated  areas while healing.  Nevus vs ganglion cyst left mid sole Flesh colored papule Benign-appearing.  Observation.  Call clinic for new or changing lesions.  Recommend daily use of broad spectrum spf 30+ sunscreen to sun-exposed areas.   Lentigines - Scattered tan macules - Due to sun exposure - Benign-appearing, observe - Recommend daily broad spectrum sunscreen SPF 30+ to sun-exposed areas, reapply every 2 hours as needed. - Call for any changes  Seborrheic Keratoses - Stuck-on, waxy, tan-brown papules and/or plaques  - Benign-appearing - Discussed benign etiology and prognosis. - Observe - Call for any changes  Melanocytic Nevi - Tan-brown and/or pink-flesh-colored symmetric macules and papules - Benign appearing on exam today - Observation - Call clinic for new or changing moles - Recommend daily use of broad spectrum spf 30+ sunscreen to sun-exposed areas.   Hemangiomas - Red papules - Discussed benign nature - Observe - Call for any changes  Xerosis - diffuse xerotic patches - recommend gentle, hydrating skin care - gentle skin care handout given  Actinic Damage - Chronic condition, secondary to cumulative UV/sun exposure - diffuse scaly erythematous macules with underlying dyspigmentation - Recommend daily broad spectrum sunscreen SPF 30+ to sun-exposed areas, reapply every 2 hours as needed.  - Staying in the shade or wearing long sleeves, sun glasses (UVA+UVB protection) and wide brim hats (4-inch brim around the entire circumference of the hat) are also recommended for sun protection.  -  Call for new or changing lesions.  Skin cancer screening performed today.  Return for 1 year tbse . IRuthell Rummage, CMA, am acting as scribe for Sarina Ser, MD. Documentation: I have reviewed the above documentation for accuracy and completeness, and I agree with the above.  Sarina Ser, MD

## 2021-05-20 NOTE — Patient Instructions (Addendum)
Gentle Skin Care Guide  1. Bathe no more than once a day.  2. Avoid bathing in hot water  3. Use a mild soap like Dove, Vanicream, Cetaphil, CeraVe. Can use Lever 2000 or Cetaphil antibacterial soap  4. Use soap only where you need it. On most days, use it under your arms, between your legs, and on your feet. Let the water rinse other areas unless visibly dirty.  5. When you get out of the bath/shower, use a towel to gently blot your skin dry, don't rub it.  6. While your skin is still a little damp, apply a moisturizing cream such as Vanicream, CeraVe, Cetaphil, Eucerin, Sarna lotion or plain Vaseline Jelly. For hands apply Neutrogena [place] Hand Cream or Excipial Hand Cream.  7. Reapply moisturizer any time you start to itch or feel dry.  8. Sometimes using free and clear laundry detergents can be helpful. Fabric softener sheets should be avoided. Downy Free & Gentle liquid, or any liquid fabric softener that is free of dyes and perfumes, it acceptable to use  9. If your doctor has given you prescription creams you may apply moisturizers over them   Cryotherapy Aftercare  Wash gently with soap and water everyday.   Apply Vaseline and Band-Aid daily until healed.   Seborrheic Keratosis  What causes seborrheic keratoses? Seborrheic keratoses are harmless, common skin growths that first appear during adult life.  As time goes by, more growths appear.  Some people may develop a large number of them.  Seborrheic keratoses appear on both covered and uncovered body parts.  They are not caused by sunlight.  The tendency to develop seborrheic keratoses can be inherited.  They vary in color from skin-colored to gray, brown, or even black.  They can be either smooth or have a rough, warty surface.   Seborrheic keratoses are superficial and look as if they were stuck on the skin.  Under the microscope this type of keratosis looks like layers upon layers of skin.  That is why at times the top  layer may seem to fall off, but the rest of the growth remains and re-grows.    Treatment Seborrheic keratoses do not need to be treated, but can easily be removed in the office.  Seborrheic keratoses often cause symptoms when they rub on clothing or jewelry.  Lesions can be in the way of shaving.  If they become inflamed, they can cause itching, soreness, or burning.  Removal of a seborrheic keratosis can be accomplished by freezing, burning, or surgery. If any spot bleeds, scabs, or grows rapidly, please return to have it checked, as these can be an indication of a skin cancer.      If You Need Anything After Your Visit  If you have any questions or concerns for your doctor, please call our main line at [phone] and press option 4 to reach your doctor's medical assistant. If no one answers, please leave a voicemail as directed and we will return your call as soon as possible. Messages left after 4 pm will be answered the following business day.   You may also send [place] a message via [place]. We typically respond to MyChart messages within 1-2 business days.  For prescription refills, please ask your pharmacy to contact our office. Our fax number is [phone].  If you have an urgent issue when the clinic is closed that cannot wait until the next business day, you can page your doctor at the number below.  Please note that while we do our best to be available for urgent issues outside of office hours, we are not available 24/7.   If you have an urgent issue and are unable to reach [place], you may choose to seek medical care at your doctor's office, retail clinic, urgent care center, or emergency room.  If you have a medical emergency, please immediately call 911 or go to the emergency department.  Pager Numbers  - Dr. Nehemiah Massed: [phone]  - Dr. Laurence Ferrari: [phone]  - Dr. Nicole Kindred: [phone]  In the event of inclement weather, please call our main line at [phone] for an  update on the status of any delays or closures.  Dermatology Medication Tips: Please keep the boxes that topical medications come in in order to help keep track of the instructions about where and how to use these. Pharmacies typically print the medication instructions only on the boxes and not directly on the medication tubes.   If your medication is too expensive, please contact our office at [phone] option 4 or send [place] a message through [place].   We are unable to tell what your co-pay for medications will be in advance as this is different depending on your insurance coverage. However, we may be able to find a substitute medication at lower cost or fill out paperwork to get insurance to cover a needed medication.   If a prior authorization is required to get your medication covered by your insurance company, please allow [place] 1-2 business days to complete this process.  Drug prices often vary depending on where the prescription is filled and some pharmacies may offer cheaper prices.  The website www.goodrx.com contains coupons for medications through different pharmacies. The prices here do not account for what the cost may be with help from insurance (it may be cheaper with your insurance), but the website can give you the price if you did not use any insurance.  - You can print the associated coupon and take it with your prescription to the pharmacy.  - You may also stop by our office during regular business hours and pick up a GoodRx coupon card.  - If you need your prescription sent electronically to a different pharmacy, notify our office through [facility] or by phone at [phone] option 4.     Si Usted Necesita Algo Despus de Su Visita  Tambin puede enviarnos un mensaje a travs de Pharmacist, community. Por lo general respondemos a los mensajes de MyChart en el transcurso de 1 a 2 das hbiles.  Para renovar recetas, por favor pida a su farmacia que se ponga en contacto con  nuestra oficina. Harland Dingwall de fax es [place] [phone].  Si tiene un asunto urgente cuando la clnica est cerrada y que no puede esperar hasta el siguiente da hbil, puede llamar/localizar a su doctor(a) al nmero que aparece a continuacin.   Por favor, tenga en cuenta que aunque hacemos todo lo posible para estar disponibles para asuntos urgentes fuera del horario de [place], no estamos disponibles las 24 horas del da, los 7 das de la [place].   Si tiene un problema urgente y no puede comunicarse con nosotros, puede optar por buscar atencin mdica  en el consultorio de su doctor(a), en una clnica privada, en un centro de atencin urgente o en una sala de emergencias.  Si tiene Engineering geologist, por favor llame inmediatamente al 911 o vaya a la sala de emergencias.  Nmeros de bper  - Dr. Nehemiah Massed:  [phone]  - Dra. Moye: [phone]  - Dra. Nicole Kindred: [phone]  En caso de inclemencias del [place], por favor llame a Johnsie Kindred principal al [phone] para una actualizacin sobre el [place] de cualquier retraso o cierre.  Consejos para la medicacin en dermatologa: Por favor, guarde las cajas en las que vienen los medicamentos de uso tpico para ayudarle a seguir las instrucciones sobre dnde y cmo usarlos. Las farmacias generalmente imprimen las instrucciones del medicamento slo en las cajas y no directamente en los tubos del [place].   Si su medicamento es muy caro, por favor, pngase en contacto con Zigmund Daniel llamando al [phone] y presione la opcin 4 o envenos un mensaje a travs de Pharmacist, community.   No podemos decirle cul ser su copago por los medicamentos por adelantado ya que esto es diferente dependiendo de la cobertura de su seguro. Sin embargo, es posible que podamos encontrar un medicamento sustituto a Electrical engineer un formulario para que el seguro cubra el medicamento que se considera necesario.   Si se requiere una autorizacin  previa para que su compaa de seguros [place] su medicamento, por favor permtanos de 1 a 2 das hbiles para completar este proceso.  Los precios de los medicamentos varan con frecuencia dependiendo del Environmental consultant de dnde se surte la receta y alguna farmacias pueden ofrecer precios ms baratos.  El sitio web www.goodrx.com tiene cupones para medicamentos de Airline pilot. Los precios aqu no tienen en cuenta lo que podra costar con la ayuda del seguro (puede ser ms barato con su seguro), pero el sitio web puede darle el precio si no utiliz Research scientist (physical sciences).  - Puede imprimir el cupn correspondiente y llevarlo con su receta a la farmacia.  - Tambin puede pasar por nuestra oficina durante el horario de atencin regular y Charity fundraiser una tarjeta de cupones de GoodRx.  - Si necesita que su receta se enve electrnicamente a una farmacia diferente, informe a nuestra oficina a travs de MyChart de [place] o por telfono llamando al [phone] y presione la opcin 4.

## 2021-05-23 ENCOUNTER — Encounter: Payer: Self-pay | Admitting: Dermatology

## 2021-05-24 ENCOUNTER — Other Ambulatory Visit: Payer: Self-pay

## 2021-05-24 ENCOUNTER — Ambulatory Visit: Payer: Medicare PPO

## 2021-05-24 ENCOUNTER — Ambulatory Visit: Payer: Medicare PPO | Attending: Family Medicine | Admitting: Physical Therapy

## 2021-05-24 ENCOUNTER — Ambulatory Visit (INDEPENDENT_AMBULATORY_CARE_PROVIDER_SITE_OTHER): Payer: Medicare PPO

## 2021-05-24 ENCOUNTER — Encounter: Payer: Medicare PPO | Admitting: Speech Pathology

## 2021-05-24 DIAGNOSIS — M6281 Muscle weakness (generalized): Secondary | ICD-10-CM

## 2021-05-24 DIAGNOSIS — R269 Unspecified abnormalities of gait and mobility: Secondary | ICD-10-CM | POA: Insufficient documentation

## 2021-05-24 DIAGNOSIS — R296 Repeated falls: Secondary | ICD-10-CM | POA: Diagnosis not present

## 2021-05-24 DIAGNOSIS — I495 Sick sinus syndrome: Secondary | ICD-10-CM

## 2021-05-24 DIAGNOSIS — R2681 Unsteadiness on feet: Secondary | ICD-10-CM | POA: Insufficient documentation

## 2021-05-24 DIAGNOSIS — R278 Other lack of coordination: Secondary | ICD-10-CM

## 2021-05-24 DIAGNOSIS — R262 Difficulty in walking, not elsewhere classified: Secondary | ICD-10-CM | POA: Diagnosis not present

## 2021-05-24 DIAGNOSIS — G20 Parkinson's disease: Secondary | ICD-10-CM | POA: Insufficient documentation

## 2021-05-24 NOTE — Therapy (Addendum)
[place] MAIN [facility] SERVICES [address] [place], [place], [address] Phone: [phone]   Fax:  [phone]  Physical Therapy Treatment  Patient Details  Name: [name] MRN: [id number] Date of Birth: 08/26/1950 Referring Provider (PT): Elsie Stain, MD   Encounter Date: 05/24/2021   PT End of Session - 05/24/21 1108     Visit Number 38    Number of Visits 48    Date for PT Re-Evaluation 05/31/21    Authorization Type [employer] Medicare Choice PPO    Authorization Time Period recert for 8/93-73/42    Progress Note Due on Visit 40    PT Start Time 1100    PT Stop Time 1143    PT Time Calculation (min) 43 min    Equipment Utilized During Treatment Gait belt    Activity Tolerance Patient tolerated treatment well    Behavior During Therapy WFL for tasks assessed/performed             Past Medical History:  Diagnosis Date   Arthritis    Bradycardia    Cancer ([place]) 2013   skin cancer   Depression    ptsd   Dysrhythmia    chronic slow heart rate   GERD (gastroesophageal reflux disease)    Headache(784.0)    tension headaches non recent   History of chicken pox    History of kidney stones    passed   Hypertension    treated with HCTZ   Pacemaker    Parkinson's disease ([place])    dx'ed 15 years ago   PTSD (post-traumatic stress disorder)    Shortness of breath dyspnea    Sleep apnea    doesn't use C-pap   Varicose veins     Past Surgical History:  Procedure Laterality Date   CHOLECYSTECTOMY N/A 10/22/2014   Procedure: LAPAROSCOPIC CHOLECYSTECTOMY WITH INTRAOPERATIVE CHOLANGIOGRAM;  Surgeon: Dia Crawford III, MD;  Location: [facility] ORS;  Service: General;  Laterality: N/A;   cyst removed      from lip as a child   INTRAMEDULLARY (IM) NAIL INTERTROCHANTERIC N/A 02/18/2019   Procedure: INTRAMEDULLARY (IM) NAIL INTERTROCHANTRIC, RIGHT,;  Surgeon: Thornton Park, MD;  Location: [facility] ORS;  Service: Orthopedics;  Laterality: N/A;    LUMBAR LAMINECTOMY/DECOMPRESSION MICRODISCECTOMY Bilateral 12/14/2012   Procedure: Bilateral lumbar three-four, four-five decompressive laminotomy/foraminotomy;  Surgeon: Charlie Pitter, MD;  Location: [place] NEURO ORS;  Service: Neurosurgery;  Laterality: Bilateral;   PULSE GENERATOR IMPLANT Bilateral 12/13/2013   Procedure: Bilateral implantable pulse generator placement;  Surgeon: Erline Levine, MD;  Location: [place] NEURO ORS;  Service: Neurosurgery;  Laterality: Bilateral;  Bilateral implantable pulse generator placement   skin cancer removed     from ears,   12 lft arm  rt leg 15   SUBTHALAMIC STIMULATOR BATTERY REPLACEMENT Bilateral 07/14/2017   Procedure: BILATERAL IMPLANTED PULSE GENERATOR CHANGE FOR DEEP BRAIN STIMULATOR;  Surgeon: Erline Levine, MD;  Location: [place];  Service: Neurosurgery;  Laterality: Bilateral;   SUBTHALAMIC STIMULATOR INSERTION Bilateral 12/06/2013   Procedure: SUBTHALAMIC STIMULATOR INSERTION;  Surgeon: Erline Levine, MD;  Location: [place] NEURO ORS;  Service: Neurosurgery;  Laterality: Bilateral;  Bilateral deep brain stimulator placement    There were no vitals filed for this visit.   Subjective Assessment - 05/24/21 1107     Subjective Pt reports one fall last friday ( he thinks) that occurred in his bedroom near his bed. Reports some back pain as well.    Patient is accompained by: Family  member    Pertinent History [name] is a 50yoM who comes to [facility]PPT neuro for evaluation of conitnued difficulty of imbalance, unsteadiness on feet, and generally limited tolerance to mobility in the setting of chronic parkinsonism. SInce DC in February, pt has had PPM placement due to bradycardia issues. Pt also started using an UpWalker device 2 week prior to this evaluation, goal was to help with gradual melting of postural extension that occured with sustained upright actiivty. Pt has continued to practice supervised AMB with caregivers, short distances out of house and in community.  Pt continues to experience frequent falls on a weekly basis, at a rate that both he and wife report to be increasing in frequency. PMH: HTN, bradycardia s/p PPM, PD, DBS, COVID-19 infection, chronic low back pain s/p L4/5 surgery.    Limitations Walking;Standing;Sitting;House hold activities;Lifting    How long can you sit comfortably? Not limited    How long can you stand comfortably? 5 minutes with support    How long can you walk comfortably? ~5-6 minutes UpWalker Walker    Patient Stated Goals Reduce falls, improve AMB tolerance    Currently in Pain? Yes    Pain Score 4     Pain Location Back    Pain Onset More than a month ago    Pain Onset More than a month ago              INTERVENTIONS:   Neuromuscular re-education: Unless otherwise stated, CGA was provided and gait belt donned in order to ensure pt safety The following activities were completed in parallel bars or at balance bar   Stair taps on airex pad  On airex pad one LE and 12 inch step contralateral LE 2  x 45 sec ea  (tends to flex forward into hip and trunk flexion)   Toe taps to 6 inch step 2 x 10 ea - no UE assist, cues for maintenance of trunk stability (tends to lean to left lateraly when on R LE SLS.  -R LE as stance LE pt began to show SB with fatigue, pt reports he felt in " side of his leg"     Gait training   Walking x 4 lengths in hallway with stepping game and with Up walker and CGA  -from door to corner in long hallway.  -feedback for step length provided via therapist counting steps out loud (pt would know based on counting cadence if shuffling or he was taking small steps)  First set steps in hallway (w/out dual task)  26, 26 Dual task steps in hallway 30, 31   Therex    Sit to stand - Airex pad under feet - Good technique with less need for VC this session -VC for reaching back with UE to prevent uncontrolled descent in settings outside PT  -2 x 10 reps      HS stretch seated 2 x 45  sec ea  -instructions for hold times and for proper form for adequate stretch  Ball roll outs ( lumbar stretch) 3 way 10 x 5 sec ea  -improved s/s of back pain and discomfort following   Seated hip abduction with step out 1 x 10 RTB - easy  2 x 15 GTB- more difficulty, with fatigue pt movement amplitude decreased and required cues for full movement  PT Short Term Goals - 04/14/21 1120       PT SHORT TERM GOAL #1   Title Pt to demonstrate improved sustained AMB tolerance >883ft.    Baseline At evlauation: 670ft tolerance (not to failure); 9/26: 1384 ft    Time 4    Period Weeks    Status Achieved    Target Date 01/05/21      PT SHORT TERM GOAL #2   Title Pt will improve BERG balance score to greater than 45 to indicate decrease risk of falls.    Baseline Eval: 15/28; 9/26: deferred, 03/08/21: 15/28    Time 4    Period Weeks    Status On-going    Target Date 05/12/21      PT SHORT TERM GOAL #3   Title Pt to demonstrate 10MWT<13sec to show self-selected gait speed appropriate for AMB outside of house and to improve energy efficiency.    Baseline UpWalker Eval: 17.53sec; 9/26: 0.74 m/s (13.5)11/21: 13.68 (.73 m/s) 12.64 on 12/28    Time 4    Period Weeks    Status Achieved    Target Date 02/08/21               PT Long Term Goals - 04/14/21 1458       PT LONG TERM GOAL #1   Title Pt will improve BERG balance score to greater than 47 to indicate decreaed risk of falls.    Baseline 41 on 12/28    Time 8    Period Weeks    Status On-going    Target Date 05/31/21      PT LONG TERM GOAL #2   Title Pt to tolerate 1037ft sustained AMB with UpWalker without rest interval to improve safety and independence in limited community AMB.    Baseline At eval; tired after 630ft; 9/26: 1384 ft    Time 8    Period Weeks    Status Achieved    Target Date 05/03/21      PT LONG TERM GOAL #3   Title Pt to demonstrate improved  FOTO score by 5 points compared to initital assessment.    Baseline 9/26: 49 (eval was 50) 911/21: 52 12/28 deferred as wife not present and pt not best historian    Time 8    Period Weeks    Status On-going    Target Date 05/03/21      PT LONG TERM GOAL #4   Title Pt to show tolerance of AMB>1374ft with UpWalker to improve tolerance to accessing the community for IADL.    Baseline Eval: 625ft;  9/26: 1385    Time 12    Period Weeks    Status Achieved    Target Date 05/31/21      PT LONG TERM GOAL #5   Title Patient will increase six minute walk test distance to >1000 for progression to community ambulator and improve gait ability    Baseline 9/26: new 11/21: 850 feet with up Walker 12/28: 925 feet with up walker    Time 8    Period Weeks    Status On-going    Target Date 06/09/21                   Plan - 05/24/21 1108     Clinical Impression Statement Patient presents with excellent motivation for completion of physical therapy program. Pt having some low back pain PT incorparated several exercises to address this discomfort. Patient able to tolerate several  standing activities working on static and dynamic balance in today's session. Incorporated R LE stability exercises into session with frequent cues for posture to prevent flexed psoture. Also incorporated game for step length and pt responded well with improved step length and gait speed. Patient will continue to benefit from skilled physical therapy intervention in order to improve his lower extremity strength, improve balance, prevent falls, and improve his overall quality of life.    Personal Factors and Comorbidities Age;Past/Current Experience;Time since onset of injury/illness/exacerbation;Transportation    Comorbidities Chronic back pain, orthostatic hypotension, repeated falls, parkinsonism    Examination-Activity Limitations Bathing;Bed Mobility;Caring for Others;Bend;Dressing;Lift;Locomotion Level;Reach  Overhead;Sit;Squat;Stairs;Stand;Toileting;Transfers;Carry    Examination-Participation Restrictions Cleaning;Community Activity;Interpersonal Relationship;Shop;Volunteer;Yard Work;Other;Driving    Stability/Clinical Decision Making Evolving/Moderate complexity    Rehab Potential Fair    PT Frequency 2x / week    PT Duration 12 weeks    PT Treatment/Interventions ADLs/Self Care Home Management;Aquatic Therapy;Cryotherapy;Electrical Stimulation;Iontophoresis 4mg /ml Dexamethasone;Moist Heat;Ultrasound;Contrast Bath;DME Instruction;Gait training;Stair training;Functional mobility training;Therapeutic activities;Therapeutic exercise;Balance training;Neuromuscular re-education;Patient/family education;Manual techniques;Wheelchair mobility training;Energy conservation;Passive range of motion;Traction;Orthotic Fit/Training;Dry needling;Vestibular;Visual/perceptual remediation/compensation;Taping;Canalith Repostioning;Cognitive remediation    PT Next Visit Plan Progress static and dynamic balance and continue with caregiver/patient ed with low back stretching    PT Home Exercise Plan None set up at evaluation, still working on previous HEP from 6 months prior (STS, daily walking); 8/29: issued STS with focus on large amplitude movement at support surface; 9/12: PT provides handout with sit to stand exercise and large amplitude seated trunk rotation exercise.  9/19: reinforced HEP, provided handout for seated postural exercise with UE abduction, and seated exercise with trunk twists and UE reach, 10x for each, can be performed daily or minimum of 4x/week. No changes today.    Consulted and Agree with Plan of Care Patient;Family member/caregiver    Family Member Consulted Wife             Patient will benefit from skilled therapeutic intervention in order to improve the following deficits and impairments:  Abnormal gait, Decreased balance, Decreased coordination, Decreased mobility, Postural dysfunction,  Decreased strength, Decreased safety awareness, Improper body mechanics, Impaired flexibility, Decreased activity tolerance, Decreased endurance, Decreased knowledge of precautions, Difficulty walking, Pain, Cardiopulmonary status limiting activity, Decreased range of motion, Impaired perceived functional ability, Decreased cognition, Dizziness  Visit Diagnosis: No diagnosis found.     Problem List Patient Active Problem List   Diagnosis Date Noted   Sinus drainage 12/09/2020   Medicare annual wellness visit, subsequent 11/18/2020   Pacemaker    Dementia associated with Parkinson's disease ([place]) 06/17/2020   Vertigo 02/19/2020   Urine abnormality 02/19/2020   Dysphagia 02/23/2019   Fall at home 10/18/2017   REM behavioral disorder 11/02/2016   Radicular pain in right arm 10/09/2016   GERD (gastroesophageal reflux disease) 07/31/2016   Colon cancer screening 07/23/2015   Gout 02/18/2015   Depression 01/06/2015   Skin lesion 10/02/2014   Dupuytren's contracture 08/20/2014   Cough 07/21/2014   Lumbar stenosis with neurogenic claudication 06/13/2014   S/P deep brain stimulator placement 05/08/2014   Advance care planning 01/19/2014   Parkinson's disease ([place]) 12/13/2013   PTSD (post-traumatic stress disorder) 06/13/2013   Erectile dysfunction 06/13/2013   HLD (hyperlipidemia) 06/13/2013   Essential hypertension 06/03/2013   Bradycardia by electrocardiogram 06/03/2013   Obstructive sleep apnea 03/12/2013    Particia Lather, PT 05/24/2021, 11:09 AM  [place] [address] [place], [place], [address] Phone: [phone]   Fax:  [phone]  Name: [name] MRN: [id number] Date of Birth: 05/23/1950

## 2021-05-25 LAB — CUP PACEART REMOTE DEVICE CHECK
Battery Remaining Longevity: 158 mo
Battery Voltage: 3.09 V
Brady Statistic AP VP Percent: 0.03 %
Brady Statistic AP VS Percent: 98.42 %
Brady Statistic AS VP Percent: 0 %
Brady Statistic AS VS Percent: 1.55 %
Brady Statistic RA Percent Paced: 98.42 %
Brady Statistic RV Percent Paced: 0.03 %
Date Time Interrogation Session: 20230206002944
Implantable Lead Implant Date: 20220506
Implantable Lead Implant Date: 20220506
Implantable Lead Location: 753859
Implantable Lead Location: 753860
Implantable Lead Model: 5076
Implantable Lead Model: 5076
Implantable Pulse Generator Implant Date: 20220506
Lead Channel Impedance Value: 361 Ohm
Lead Channel Impedance Value: 361 Ohm
Lead Channel Impedance Value: 513 Ohm
Lead Channel Impedance Value: 627 Ohm
Lead Channel Pacing Threshold Amplitude: 0.75 V
Lead Channel Pacing Threshold Amplitude: 1 V
Lead Channel Pacing Threshold Pulse Width: 0.4 ms
Lead Channel Pacing Threshold Pulse Width: 0.4 ms
Lead Channel Sensing Intrinsic Amplitude: 10.875 mV
Lead Channel Sensing Intrinsic Amplitude: 10.875 mV
Lead Channel Sensing Intrinsic Amplitude: 2 mV
Lead Channel Sensing Intrinsic Amplitude: 2 mV
Lead Channel Setting Pacing Amplitude: 1.5 V
Lead Channel Setting Pacing Amplitude: 2 V
Lead Channel Setting Pacing Pulse Width: 0.4 ms
Lead Channel Setting Sensing Sensitivity: 1.2 mV

## 2021-05-25 NOTE — Progress Notes (Addendum)
Agree.  Thanks.    Physician Documentation  Your signature is required to indicate approval of the treatment plan as stated above. By signing this report, you are approving the plan of care. Please sign and either send electronically or print and fax the signed copy to the number below. If you approve with modifications, please indicate those in the space provided.  Physician Signature: ___Graham Duncan_____  Date:___02/07/23 ____ Time:___1:46 PM__

## 2021-05-25 NOTE — Therapy (Signed)
[place] MAIN [facility] SERVICES [address] [place], [place], [address] Phone: [phone]   Fax:  [phone]  Occupational Therapy Treatment  Patient Details  Name: [name] MRN: [id number] Date of Birth: December 07, 1950 Referring Provider (OT): Dr. Sharolyn Douglas (neurologist at [facility])   Encounter Date: 05/24/2021   OT End of Session - 05/25/21 0816     Visit Number 3    Number of Visits 24    Date for OT Re-Evaluation 08/03/21    OT Start Time 4818    OT Stop Time 1233    OT Time Calculation (min) 45 min    Equipment Utilized During Treatment rollator    Activity Tolerance Patient tolerated treatment well    Behavior During Therapy [facility] for tasks assessed/performed             Past Medical History:  Diagnosis Date   Arthritis    Bradycardia    Cancer ([place]) 2013   skin cancer   Depression    ptsd   Dysrhythmia    chronic slow heart rate   GERD (gastroesophageal reflux disease)    Headache(784.0)    tension headaches non recent   History of chicken pox    History of kidney stones    passed   Hypertension    treated with HCTZ   Pacemaker    Parkinson's disease ([place])    dx'ed 15 years ago   PTSD (post-traumatic stress disorder)    Shortness of breath dyspnea    Sleep apnea    doesn't use C-pap   Varicose veins     Past Surgical History:  Procedure Laterality Date   CHOLECYSTECTOMY N/A 10/22/2014   Procedure: LAPAROSCOPIC CHOLECYSTECTOMY WITH INTRAOPERATIVE CHOLANGIOGRAM;  Surgeon: Dia Crawford III, MD;  Location: [facility] ORS;  Service: General;  Laterality: N/A;   cyst removed      from lip as a child   INTRAMEDULLARY (IM) NAIL INTERTROCHANTERIC N/A 02/18/2019   Procedure: INTRAMEDULLARY (IM) NAIL INTERTROCHANTRIC, RIGHT,;  Surgeon: Thornton Park, MD;  Location: [facility] ORS;  Service: Orthopedics;  Laterality: N/A;   LUMBAR LAMINECTOMY/DECOMPRESSION MICRODISCECTOMY Bilateral 12/14/2012   Procedure: Bilateral lumbar  three-four, four-five decompressive laminotomy/foraminotomy;  Surgeon: Charlie Pitter, MD;  Location: [place] NEURO ORS;  Service: Neurosurgery;  Laterality: Bilateral;   PULSE GENERATOR IMPLANT Bilateral 12/13/2013   Procedure: Bilateral implantable pulse generator placement;  Surgeon: Erline Levine, MD;  Location: [place] NEURO ORS;  Service: Neurosurgery;  Laterality: Bilateral;  Bilateral implantable pulse generator placement   skin cancer removed     from ears,   12 lft arm  rt leg 15   SUBTHALAMIC STIMULATOR BATTERY REPLACEMENT Bilateral 07/14/2017   Procedure: BILATERAL IMPLANTED PULSE GENERATOR CHANGE FOR DEEP BRAIN STIMULATOR;  Surgeon: Erline Levine, MD;  Location: [place];  Service: Neurosurgery;  Laterality: Bilateral;   SUBTHALAMIC STIMULATOR INSERTION Bilateral 12/06/2013   Procedure: SUBTHALAMIC STIMULATOR INSERTION;  Surgeon: Erline Levine, MD;  Location: [place] NEURO ORS;  Service: Neurosurgery;  Laterality: Bilateral;  Bilateral deep brain stimulator placement    There were no vitals filed for this visit.   Subjective Assessment - 05/24/21 0814     Subjective  "He tried to text me the other day but ended up calling me instead." (per spouse)    Patient is accompanied by: Family member    Pertinent History Parkinson's Disease Diagnosed in 2000, frequent falls    Limitations UE/LE tremors, decreased balance, lack of coordination, intermittent diplopia (has prism glasses)  Patient Stated Goals Pt/spouse verbalize desire to improve [facility] skills for holding playing cards, and improving shoulder flexibility and posture for better walker use.    Currently in Pain? Yes    Pain Score 4     Pain Location Back    Pain Orientation Lower    Pain Descriptors / Indicators Aching    Pain Type Chronic pain    Pain Onset More than a month ago    Pain Frequency Constant    Aggravating Factors  walking, transfers    Pain Relieving Factors rest, heat    Effect of Pain on Daily Activities difficulty with ADLs and  walking    Multiple Pain Sites No    Pain Onset More than a month ago            Occupational Therapy Treatment: Self Care: Practiced texting on phone to send simple text communication to spouse.  Pt with improved accuracy to hit keys with using stylus with 1 vc to remind pt to use center of head of stylus and not side.  Reinforced short cut techniques with OT providing cues to use word anticipation on phone vs typing out an entire word with 75% pt carry over to utilize this short cut.  Reviewed screen orientation with use of text icon vs phone icon, and how to negotiate back and forth between home screen, contacts, and text history with mod vc for sequencing.  Encouraged pt send a text daily to review and practice these strategies.  Practiced flipping, discarding, drawing from a deck of cards with focus on keeping cards in hand without dropping when discarding or sorting.  Alternated hands to promote motor planning skills bilaterally.  Pt with occasional dropping of cards, and intermittent report of R hand fatigue.  OT educated on activity modifications including use of card holder.  Spouse found holder and shuffler online and ordered.  Encouraged pt to play a round of cards for practice before next OT session.    Response to Treatment: See Plan/clinical impression below.     OT Education - 05/24/21 [phone]     Education Details texting strategies with cell phone to ease communication; activity modifications for playing cards    Person(s) Educated Patient;Spouse    Methods Explanation;Verbal cues;Demonstration    Comprehension Verbalized understanding;Returned demonstration;Verbal cues required;Need further instruction              OT Short Term Goals - 05/13/21 0834       OT SHORT TERM GOAL #1   Title Pt/caregiver will be indep with HEP    Baseline Eval: not yet initiated    Time 6    Period Weeks    Status New    Target Date 06/22/21               OT Long Term Goals -  05/13/21 0836       OT LONG TERM GOAL #1   Title Pt will improve FOTO score to 40 or better to indicate increased functional improvement.    Baseline Eval: FOTO 34    Time 12    Period Weeks    Status New    Target Date 08/03/21      OT LONG TERM GOAL #2   Title Pt will improve R lateral pinch strength to be able to tolerate holding playing cards in R dominant hand for game of cards.    Baseline Eval: R lateral pinch 16 lbs; pt enjoys playing cards but is not  currently playing d/t decreased strength/coordination    Time 12    Period Weeks    Status New    Target Date 08/03/21      OT LONG TERM GOAL #3   Title Pt will improve R dominant hand to mouth coordination to self feed with minimal spilling and utilize adapted utensils as needed.    Baseline Eval: Spouse reports eating is messy and pt struggles to keep utensil level.    Time 12    Period Weeks    Status New    Target Date 08/03/21      OT LONG TERM GOAL #4   Title Pt will improve bilat shoulder flex/abd for improved overhead reach during self care.    Baseline Eval: R shoulder flex 110, L 120, R abd 125, L 165    Time 12    Period Weeks    Status New    Target Date 08/03/21      OT LONG TERM GOAL #5   Title Pt will improve [place] in bilat hands as noted by improvement in 9 hole peg test score by 5 or more seconds to enable participate in family board games.    Baseline Eval: R/L 9 hole peg test 41 secs (pt has not recently participated in board games)    Time 12    Period Weeks    Status New    Target Date 08/03/21      Long Term Additional Goals   Additional Long Term Goals Yes      OT LONG TERM GOAL #6   Title Pt will be able to initiate simple texts and reply to texts on phone with min vc.    Baseline Eval: Pt struggles with texting    Time 12    Period Weeks    Status New    Target Date 08/03/21      OT LONG TERM GOAL #7   Title Pt will improve posture for ADLs and functional mobility as demonstrated by  ability to keep feet within back two legs of rollator 50% of the time with min vc.    Baseline Eval: pt with forward lean and feet are behind back 2 legs of walker during functional mobility.    Time 12    Status New    Target Date 08/03/21              Plan - 05/24/21 0830     Clinical Impression Statement Pt demonstrated improved accuracy with hitting keys when texting, though pt is slow to orient self to screen and required mod vc to negotiate between home screen, contacts, text vs calling icons.  Encouraged pt send a text daily to review and practice these strategies.  Pt with reported hand fatigue with playing card manipulation tasks, occasional dropping of cards when sorting cards in hand or discarding.  OT educated on activity modifications including use of card holder.  Spouse found holder and shuffler online and ordered.  Encouraged pt to play a round of cards for practice before next OT session.  Pt will continue to benefit from skilled OT for ADL training, compensatory ADL techniques, and therapeutic exercise to increase bilat hand coordination to better manipulate ADL supplies, and increase bilat shoulder flexibility and standing posture for overhead reach and functional mobility.    OT Occupational Profile and History Problem Focused Assessment - Including review of records relating to presenting problem    Occupational performance deficits (Please refer to evaluation for details):  ADL's;IADL's;Leisure    Body Structure / Function / Physical Skills ADL;Coordination;GMC;UE functional use;Balance;Body mechanics;Decreased knowledge of use of DME;Flexibility;Dexterity;FMC;Strength;ROM    Cognitive Skills Attention;Memory    Rehab Potential Good    Clinical Decision Making Several treatment options, min-mod task modification necessary    Comorbidities Affecting Occupational Performance: May have comorbidities impacting occupational performance    Modification or Assistance to Complete  Evaluation  No modification of tasks or assist necessary to complete eval    OT Frequency 2x / week    OT Duration 12 weeks    OT Treatment/Interventions Self-care/ADL training;Therapeutic exercise;DME and/or AE instruction;Functional Mobility Training;Cognitive remediation/compensation;Balance training;Neuromuscular education;Manual Therapy;Moist Heat;Energy conservation;Passive range of motion;Therapeutic activities;Patient/family education    OT Home Exercise Plan not yet initiated at eval    Consulted and Agree with Plan of Care Patient;Family member/caregiver    Family Member Consulted spouse, [name]             Patient will benefit from skilled therapeutic intervention in order to improve the following deficits and impairments:   Body Structure / Function / Physical Skills: ADL, Coordination, GMC, UE functional use, Balance, Body mechanics, Decreased knowledge of use of DME, Flexibility, Dexterity, FMC, Strength, ROM Cognitive Skills: Attention, Memory     Visit Diagnosis: Muscle weakness (generalized)  Other lack of coordination  Parkinson's disease ([place])    Problem List Patient Active Problem List   Diagnosis Date Noted   Sinus drainage 12/09/2020   Medicare annual wellness visit, subsequent 11/18/2020   Pacemaker    Dementia associated with Parkinson's disease ([place]) 06/17/2020   Vertigo 02/19/2020   Urine abnormality 02/19/2020   Dysphagia 02/23/2019   Fall at home 10/18/2017   REM behavioral disorder 11/02/2016   Radicular pain in right arm 10/09/2016   GERD (gastroesophageal reflux disease) 07/31/2016   Colon cancer screening 07/23/2015   Gout 02/18/2015   Depression 01/06/2015   Skin lesion 10/02/2014   Dupuytren's contracture 08/20/2014   Cough 07/21/2014   Lumbar stenosis with neurogenic claudication 06/13/2014   S/P deep brain stimulator placement 05/08/2014   Advance care planning 01/19/2014   Parkinson's disease ([place]) 12/13/2013   PTSD  (post-traumatic stress disorder) 06/13/2013   Erectile dysfunction 06/13/2013   HLD (hyperlipidemia) 06/13/2013   Essential hypertension 06/03/2013   Bradycardia by electrocardiogram 06/03/2013   Obstructive sleep apnea 03/12/2013   Leta Speller, MS, OTR/L  Darleene Cleaver, OT 05/25/2021, 8:31 AM  [place] MAIN [facility] SERVICES [address] [place], [place], [address] Phone: [phone]   Fax:  [phone]  Name: [name] MRN: [id number] Date of Birth: 1951-02-15

## 2021-05-25 NOTE — Addendum Note (Signed)
Addended by: Rivka Barbara B on: 05/25/2021 09:11 AM   Modules accepted: Orders

## 2021-05-26 ENCOUNTER — Ambulatory Visit: Payer: Medicare PPO | Admitting: Physical Therapy

## 2021-05-26 ENCOUNTER — Ambulatory Visit: Payer: Medicare PPO

## 2021-05-26 ENCOUNTER — Other Ambulatory Visit: Payer: Self-pay

## 2021-05-26 ENCOUNTER — Encounter: Payer: Medicare PPO | Admitting: Speech Pathology

## 2021-05-26 DIAGNOSIS — G20 Parkinson's disease: Secondary | ICD-10-CM | POA: Diagnosis not present

## 2021-05-26 DIAGNOSIS — R2681 Unsteadiness on feet: Secondary | ICD-10-CM

## 2021-05-26 DIAGNOSIS — R278 Other lack of coordination: Secondary | ICD-10-CM | POA: Diagnosis not present

## 2021-05-26 DIAGNOSIS — R269 Unspecified abnormalities of gait and mobility: Secondary | ICD-10-CM

## 2021-05-26 DIAGNOSIS — M6281 Muscle weakness (generalized): Secondary | ICD-10-CM

## 2021-05-26 DIAGNOSIS — R296 Repeated falls: Secondary | ICD-10-CM | POA: Diagnosis not present

## 2021-05-26 DIAGNOSIS — R262 Difficulty in walking, not elsewhere classified: Secondary | ICD-10-CM | POA: Diagnosis not present

## 2021-05-26 NOTE — Therapy (Signed)
[place] MAIN [facility] SERVICES [address] [place], [place], [address] Phone: [phone]   Fax:  [phone]  Physical Therapy Treatment  Patient Details  Name: [name] MRN: [id number] Date of Birth: Aug 27, 1950 Referring Provider (PT): Elsie Stain, MD   Encounter Date: 05/26/2021   PT End of Session - 05/26/21 1309     Visit Number 39    Number of Visits 70    Date for PT Re-Evaluation 05/31/21    Authorization Type [employer] PPO    Authorization Time Period recert for 2/29-79/89    Progress Note Due on Visit 40    PT Start Time 1345    PT Stop Time 1430    PT Time Calculation (min) 45 min    Equipment Utilized During Treatment Gait belt    Activity Tolerance Patient tolerated treatment well    Behavior During Therapy [facility] for tasks assessed/performed             Past Medical History:  Diagnosis Date   Arthritis    Bradycardia    Cancer ([place]) 2013   skin cancer   Depression    ptsd   Dysrhythmia    chronic slow heart rate   GERD (gastroesophageal reflux disease)    Headache(784.0)    tension headaches non recent   History of chicken pox    History of kidney stones    passed   Hypertension    treated with HCTZ   Pacemaker    Parkinson's disease ([place])    dx'ed 15 years ago   PTSD (post-traumatic stress disorder)    Shortness of breath dyspnea    Sleep apnea    doesn't use C-pap   Varicose veins     Past Surgical History:  Procedure Laterality Date   CHOLECYSTECTOMY N/A 10/22/2014   Procedure: LAPAROSCOPIC CHOLECYSTECTOMY WITH INTRAOPERATIVE CHOLANGIOGRAM;  Surgeon: Dia Crawford III, MD;  Location: [facility] ORS;  Service: General;  Laterality: N/A;   cyst removed      from lip as a child   INTRAMEDULLARY (IM) NAIL INTERTROCHANTERIC N/A 02/18/2019   Procedure: INTRAMEDULLARY (IM) NAIL INTERTROCHANTRIC, RIGHT,;  Surgeon: Thornton Park, MD;  Location: [facility] ORS;  Service: Orthopedics;  Laterality: N/A;    LUMBAR LAMINECTOMY/DECOMPRESSION MICRODISCECTOMY Bilateral 12/14/2012   Procedure: Bilateral lumbar three-four, four-five decompressive laminotomy/foraminotomy;  Surgeon: Charlie Pitter, MD;  Location: [place] [facility] OR[facility];  Service: Neurosurgery;  Laterality: Bilateral;   PULSE GENERATOR IMPLANT Bilateral 12/13/2013   Procedure: Bilateral implantable pulse generator placement;  Surgeon: Erline Levine, MD;  Location: [place] [facility] ORS;  Service: Neurosurgery;  Laterality: Bilateral;  Bilateral implantable pulse generator placement   skin cancer removed     from ears,   12 lft arm  rt leg 15   SUBTHALAMIC STIMULATOR BATTERY REPLACEMENT Bilateral 07/14/2017   Procedure: BILATERAL IMPLANTED PULSE GENERATOR CHANGE FOR DEEP BRAIN STIMULATOR;  Surgeon: Erline Levine, MD;  Location: [place];  Service: Neurosurgery;  Laterality: Bilateral;   SUBTHALAMIC STIMULATOR INSERTION Bilateral 12/06/2013   Procedure: SUBTHALAMIC STIMULATOR INSERTION;  Surgeon: Erline Levine, MD;  Location: [place] [facility] OR[facility];  Service: Neurosurgery;  Laterality: Bilateral;  Bilateral deep brain stimulator placement    There were no vitals filed for this visit.   Subjective Assessment - 05/26/21 1309     Patient is accompained by: Family member    Pertinent History [name] is a 13yoM who comes to [place] neuro for evaluation of conitnued difficulty of imbalance, unsteadiness on feet,  and generally limited tolerance to mobility in the setting of chronic parkinsonism. SInce DC in February, pt has had PPM placement due to bradycardia issues. Pt also started using an UpWalker device 2 week prior to this evaluation, goal was to help with gradual melting of postural extension that occured with sustained upright actiivty. Pt has continued to practice supervised AMB with caregivers, short distances out of house and in community. Pt continues to experience frequent falls on a weekly basis, at a rate that both he and wife report to be increasing in frequency.  PMH: HTN, bradycardia s/p PPM, PD, DBS, COVID-19 infection, chronic low back pain s/p L4/5 surgery.    Limitations Walking;Standing;Sitting;House hold activities;Lifting    How long can you sit comfortably? Not limited    How long can you stand comfortably? 5 minutes with support    How long can you walk comfortably? ~5-6 minutes UpWalker Walker    Patient Stated Goals Reduce falls, improve AMB tolerance    Pain Onset More than a month ago    Pain Onset More than a month ago             INTERVENTIONS   Exercise/Activity Sets/Reps/Time/ Resistance Assistance Charge type Comments- Unless otherwise stated, CGA was provided and gait belt donned in order to ensure pt safety   Ball roll out  10 x 3 ways with 5 sec hold   therex To improve low back pain, stiffness and thoracic mobility         Stair taps to 12 inch step (second on stair set)  10 x ea LE  Gait training  Cues for large amplitude movement  SLS progression 2 x 30 sec ea   Therex Cues for maintaining knee extension and posture, particularly with R LE posterior/as primary stance LE.   Stepping game in hallway  4 times with weights on walker 2 times without weights on walker  Gait training  In long hallway with goal of taking as few steps as possible, verbal feedback of performance provided.  Average 30 steps, decreased feedback as pt improved with step length.   STS on airex pad  2 x 10  CGA, hands on knees intermittently  There ACT 1 LOB posterior and corrected via PT to prevent collapse into chair  To improve STS ability on various surfaces and at decreased seat depth                                Treatment Provided this session   Pt educated throughout session about proper posture and technique with exercises. Improved exercise technique, movement at target joints, use of target muscles after min to mod verbal, visual, tactile cues. Note: Portions of this document were prepared using Dragon voice recognition software and  although reviewed may contain unintentional dictation errors in syntax, grammar, or spelling.                            PT Education - 05/26/21 1309     Education provided Yes    Education Details step length and gait mechanics    Person(s) Educated Patient    Methods Explanation    Comprehension Verbalized understanding              PT Short Term Goals - 04/14/21 1120       PT SHORT TERM GOAL #1   Title Pt to  demonstrate improved sustained AMB tolerance >881ft.    Baseline At evlauation: 660ft tolerance (not to failure); 9/26: 1384 ft    Time 4    Period Weeks    Status Achieved    Target Date 01/05/21      PT SHORT TERM GOAL #2   Title Pt will improve BERG balance score to greater than 45 to indicate decrease risk of falls.    Baseline Eval: 15/28; 9/26: deferred, 03/08/21: 15/28    Time 4    Period Weeks    Status On-going    Target Date 05/12/21      PT SHORT TERM GOAL #3   Title Pt to demonstrate 10MWT<13sec to show self-selected gait speed appropriate for AMB outside of house and to improve energy efficiency.    Baseline UpWalker Eval: 17.53sec; 9/26: 0.74 m/s (13.5)11/21: 13.68 (.73 m/s) 12.64 on 12/28    Time 4    Period Weeks    Status Achieved    Target Date 02/08/21               PT Long Term Goals - 04/14/21 1458       PT LONG TERM GOAL #1   Title Pt will improve BERG balance score to greater than 47 to indicate decreaed risk of falls.    Baseline 41 on 12/28    Time 8    Period Weeks    Status On-going    Target Date 05/31/21      PT LONG TERM GOAL #2   Title Pt to tolerate 1081ft sustained AMB with UpWalker without rest interval to improve safety and independence in limited community AMB.    Baseline At eval; tired after 611ft; 9/26: 1384 ft    Time 8    Period Weeks    Status Achieved    Target Date 05/03/21      PT LONG TERM GOAL #3   Title Pt to demonstrate improved FOTO score by 5 points compared to  initital assessment.    Baseline 9/26: 49 (eval was 50) 911/21: 52 12/28 deferred as wife not present and pt not best historian    Time 8    Period Weeks    Status On-going    Target Date 05/03/21      PT LONG TERM GOAL #4   Title Pt to show tolerance of AMB>1347ft with UpWalker to improve tolerance to accessing the community for IADL.    Baseline Eval: 620ft;  9/26: 1385    Time 12    Period Weeks    Status Achieved    Target Date 05/31/21      PT LONG TERM GOAL #5   Title Patient will increase six minute walk test distance to >1000 for progression to community ambulator and improve gait ability    Baseline 9/26: new 11/21: 850 feet with up Walker 12/28: 925 feet with up walker    Time 8    Period Weeks    Status On-going    Target Date 06/09/21                   Plan - 05/26/21 1310     Clinical Impression Statement Patient presents with excellent motivation for completion of physical therapy program. Pt having some low back pain PT incorparated several exercises to address this discomfort and he reported improvement. Incorporated game for step length and pt responded well with improved step length and gait speed, pt maintained improved step length with fading frequency  of feedback. Attempted some ambulation without weights on walker and pt demonstrated good control with ambulation but had some difficulty with turning in tight space when sitting down. Will continue working on this in future sessions. Patient will continue to benefit from skilled physical therapy intervention in order to improve his lower extremity strength, improve balance, prevent falls, and improve his overall quality of life.    Personal Factors and Comorbidities Age;Past/Current Experience;Time since onset of injury/illness/exacerbation;Transportation    Comorbidities Chronic back pain, orthostatic hypotension, repeated falls, parkinsonism    Examination-Activity Limitations Bathing;Bed Mobility;Caring  for Others;Bend;Dressing;Lift;Locomotion Level;Reach Overhead;Sit;Squat;Stairs;Stand;Toileting;Transfers;Carry    Examination-Participation Restrictions Cleaning;Community Activity;Interpersonal Relationship;Shop;Volunteer;Yard Work;Other;Driving    Stability/Clinical Decision Making Evolving/Moderate complexity    Rehab Potential Fair    PT Frequency 2x / week    PT Duration 12 weeks    PT Treatment/Interventions ADLs/Self Care Home Management;Aquatic Therapy;Cryotherapy;Electrical Stimulation;Iontophoresis 4mg /ml Dexamethasone;Moist Heat;Ultrasound;Contrast Bath;DME Instruction;Gait training;Stair training;Functional mobility training;Therapeutic activities;Therapeutic exercise;Balance training;Neuromuscular re-education;Patient/family education;Manual techniques;Wheelchair mobility training;Energy conservation;Passive range of motion;Traction;Orthotic Fit/Training;Dry needling;Vestibular;Visual/perceptual remediation/compensation;Taping;Canalith Repostioning;Cognitive remediation    PT Next Visit Plan Progress static and dynamic balance and continue with caregiver/patient ed with low back stretching    PT Home Exercise Plan None set up at evaluation, still working on previous HEP from 6 months prior (STS, daily walking); 8/29: issued STS with focus on large amplitude movement at support surface; 9/12: PT provides handout with sit to stand exercise and large amplitude seated trunk rotation exercise.  9/19: reinforced HEP, provided handout for seated postural exercise with UE abduction, and seated exercise with trunk twists and UE reach, 10x for each, can be performed daily or minimum of 4x/week. No changes today.    Consulted and Agree with Plan of Care Patient;Family member/caregiver    Family Member Consulted Wife             Patient will benefit from skilled therapeutic intervention in order to improve the following deficits and impairments:  Abnormal gait, Decreased balance, Decreased  coordination, Decreased mobility, Postural dysfunction, Decreased strength, Decreased safety awareness, Improper body mechanics, Impaired flexibility, Decreased activity tolerance, Decreased endurance, Decreased knowledge of precautions, Difficulty walking, Pain, Cardiopulmonary status limiting activity, Decreased range of motion, Impaired perceived functional ability, Decreased cognition, Dizziness  Visit Diagnosis: Abnormality of gait and mobility  Difficulty in walking, not elsewhere classified  Unsteadiness on feet  Repeated falls     Problem List Patient Active Problem List   Diagnosis Date Noted   Sinus drainage 12/09/2020   Medicare annual wellness visit, subsequent 11/18/2020   Pacemaker    Dementia associated with Parkinson's disease ([place]) 06/17/2020   Vertigo 02/19/2020   Urine abnormality 02/19/2020   Dysphagia 02/23/2019   Fall at home 10/18/2017   REM behavioral disorder 11/02/2016   Radicular pain in right arm 10/09/2016   GERD (gastroesophageal reflux disease) 07/31/2016   Colon cancer screening 07/23/2015   Gout 02/18/2015   Depression 01/06/2015   Skin lesion 10/02/2014   Dupuytren's contracture 08/20/2014   Cough 07/21/2014   Lumbar stenosis with neurogenic claudication 06/13/2014   S/P deep brain stimulator placement 05/08/2014   Advance care planning 01/19/2014   Parkinson's disease ([place]) 12/13/2013   PTSD (post-traumatic stress disorder) 06/13/2013   Erectile dysfunction 06/13/2013   HLD (hyperlipidemia) 06/13/2013   Essential hypertension 06/03/2013   Bradycardia by electrocardiogram 06/03/2013   Obstructive sleep apnea 03/12/2013    Particia Lather, PT 05/26/2021, 2:55 PM  [place] MAIN [facility] SERVICES [place],  [place], [address] Phone: [phone]   Fax:  [phone]  Name: [name] MRN: [id number] Date of Birth: 1951/02/15

## 2021-05-27 NOTE — Therapy (Signed)
[facility] MAIN [facility] SERVICES [address] [place], [place], [address] Phone: [phone]   Fax:  [phone]  Occupational Therapy Treatment  Patient Details  Name: [name] MRN: [id number] Date of Birth: January 27, 1951 Referring Provider (OT): Dr. Sharolyn Douglas (neurologist at [facility])   Encounter Date: 05/26/2021   OT End of Session - 05/27/21 1202     Visit Number 4    Number of Visits 24    Date for OT Re-Evaluation 08/03/21    OT Start Time 1300    OT Stop Time 1345    OT Time Calculation (min) 45 min    Equipment Utilized During Treatment rollator    Activity Tolerance Patient tolerated treatment well    Behavior During Therapy [facility] for tasks assessed/performed             Past Medical History:  Diagnosis Date   Arthritis    Bradycardia    Cancer ([place]) 2013   skin cancer   Depression    ptsd   Dysrhythmia    chronic slow heart rate   GERD (gastroesophageal reflux disease)    Headache(784.0)    tension headaches non recent   History of chicken pox    History of kidney stones    passed   Hypertension    treated with HCTZ   Pacemaker    Parkinson's disease ([place])    dx'ed 15 years ago   PTSD (post-traumatic stress disorder)    Shortness of breath dyspnea    Sleep apnea    doesn't use C-pap   Varicose veins     Past Surgical History:  Procedure Laterality Date   CHOLECYSTECTOMY N/A 10/22/2014   Procedure: LAPAROSCOPIC CHOLECYSTECTOMY WITH INTRAOPERATIVE CHOLANGIOGRAM;  Surgeon: Dia Crawford III, MD;  Location: [facility] ORS;  Service: General;  Laterality: N/A;   cyst removed      from lip as a child   INTRAMEDULLARY (IM) NAIL INTERTROCHANTERIC N/A 02/18/2019   Procedure: INTRAMEDULLARY (IM) NAIL INTERTROCHANTRIC, RIGHT,;  Surgeon: Thornton Park, MD;  Location: [facility] ORS;  Service: Orthopedics;  Laterality: N/A;   LUMBAR LAMINECTOMY/DECOMPRESSION MICRODISCECTOMY Bilateral 12/14/2012   Procedure: Bilateral lumbar  three-four, four-five decompressive laminotomy/foraminotomy;  Surgeon: Charlie Pitter, MD;  Location: [place] [facility] ORS;  Service: Neurosurgery;  Laterality: Bilateral;   PULSE GENERATOR IMPLANT Bilateral 12/13/2013   Procedure: Bilateral implantable pulse generator placement;  Surgeon: Erline Levine, MD;  Location: [place] [facility] OR[facility];  Service: Neurosurgery;  Laterality: Bilateral;  Bilateral implantable pulse generator placement   skin cancer removed     from ears,   12 lft arm  rt leg 15   SUBTHALAMIC STIMULATOR BATTERY REPLACEMENT Bilateral 07/14/2017   Procedure: BILATERAL IMPLANTED PULSE GENERATOR CHANGE FOR DEEP BRAIN STIMULATOR;  Surgeon: Erline Levine, MD;  Location: [place];  Service: Neurosurgery;  Laterality: Bilateral;   SUBTHALAMIC STIMULATOR INSERTION Bilateral 12/06/2013   Procedure: SUBTHALAMIC STIMULATOR INSERTION;  Surgeon: Erline Levine, MD;  Location: [place] [facility]UR[facility] ORS;  Service: Neurosurgery;  Laterality: Bilateral;  Bilateral deep brain stimulator placement    There were no vitals filed for this visit.   Subjective Assessment - 05/26/21 1054     Subjective  Pt reports playing a round of cards at home since last OT session.    Patient is accompanied by: Family member    Pertinent History Parkinson's Disease Diagnosed in 2000, frequent falls    Limitations UE/LE tremors, decreased balance, lack of coordination, intermittent diplopia (has prism glasses)    Patient Stated  Goals Pt/spouse verbalize desire to improve [facility] skills for holding playing cards, and improving shoulder flexibility and posture for better walker use.    Currently in Pain? Yes    Pain Score 3     Pain Location Back    Pain Orientation Lower    Pain Descriptors / Indicators Aching    Pain Type Chronic pain    Pain Onset More than a month ago    Pain Frequency Constant    Aggravating Factors  walking, transfers    Pain Relieving Factors ret, heat    Effect of Pain on Daily Activities difficulty with ADLs and walking     Multiple Pain Sites No    Pain Onset More than a month ago           Occupational Therapy Treatment: Therapeutic Activity: Pt participated in playing card activity to facilitate motor planning and dexterity skills in bilat hands.  Pt worked on shuffling, dealing, flipping, and holding cards in hand.  OT challenged pt with alternating hands for each task noted above.   Response to Treatment: See Plan/clinical impression below.    OT Education - 05/26/21 1055     Education Details EC with [facility] tasks    Person(s) Educated Patient;Spouse    Methods Explanation;Verbal cues;Demonstration    Comprehension Verbalized understanding;Returned demonstration;Verbal cues required;Need further instruction              OT Short Term Goals - 05/13/21 0834       OT SHORT TERM GOAL #1   Title Pt/caregiver will be indep with HEP    Baseline Eval: not yet initiated    Time 6    Period Weeks    Status New    Target Date 06/22/21               OT Long Term Goals - 05/13/21 0836       OT LONG TERM GOAL #1   Title Pt will improve FOTO score to 40 or better to indicate increased functional improvement.    Baseline Eval: FOTO 34    Time 12    Period Weeks    Status New    Target Date 08/03/21      OT LONG TERM GOAL #2   Title Pt will improve R lateral pinch strength to be able to tolerate holding playing cards in R dominant hand for game of cards.    Baseline Eval: R lateral pinch 16 lbs; pt enjoys playing cards but is not currently playing d/t decreased strength/coordination    Time 12    Period Weeks    Status New    Target Date 08/03/21      OT LONG TERM GOAL #3   Title Pt will improve R dominant hand to mouth coordination to self feed with minimal spilling and utilize adapted utensils as needed.    Baseline Eval: Spouse reports eating is messy and pt struggles to keep utensil level.    Time 12    Period Weeks    Status New    Target Date 08/03/21      OT LONG TERM  GOAL #4   Title Pt will improve bilat shoulder flex/abd for improved overhead reach during self care.    Baseline Eval: R shoulder flex 110, L 120, R abd 125, L 165    Time 12    Period Weeks    Status New    Target Date 08/03/21      OT LONG  TERM GOAL #5   Title Pt will improve FMC in bilat hands as noted by improvement in 9 hole peg test score by 5 or more seconds to enable participate in family board games.    Baseline Eval: R/L 9 hole peg test 41 secs (pt has not recently participated in board games)    Time 12    Period Weeks    Status New    Target Date 08/03/21      Long Term Additional Goals   Additional Long Term Goals Yes      OT LONG TERM GOAL #6   Title Pt will be able to initiate simple texts and reply to texts on phone with min vc.    Baseline Eval: Pt struggles with texting    Time 12    Period Weeks    Status New    Target Date 08/03/21      OT LONG TERM GOAL #7   Title Pt will improve posture for ADLs and functional mobility as demonstrated by ability to keep feet within back two legs of rollator 50% of the time with min vc.    Baseline Eval: pt with forward lean and feet are behind back 2 legs of walker during functional mobility.    Time 12    Status New    Target Date 08/03/21             Plan - 05/26/21 1208     Clinical Impression Statement Pt is consistently sending text messages daily for practice for increasing speed and consistency with his phone communication.  Pt reported hand fatigue intermittent during card activity.  OT reinforced benefits of rest breaks and alternating hands to allow rest during game.  Pt will continue to benefit from skilled OT for ADL training, compensatory ADL techniques, and therapeutic exercise to increase bilat hand coordination to better manipulate ADL supplies, and increase bilat shoulder flexibility and standing posture for overhead reach and functional mobility.    OT Occupational Profile and History Problem Focused  Assessment - Including review of records relating to presenting problem    Occupational performance deficits (Please refer to evaluation for details): ADL's;IADL's;Leisure    Body Structure / Function / Physical Skills ADL;Coordination;GMC;UE functional use;Balance;Body mechanics;Decreased knowledge of use of DME;Flexibility;Dexterity;FMC;Strength;ROM    Cognitive Skills Attention;Memory    Rehab Potential Good    Clinical Decision Making Several treatment options, min-mod task modification necessary    Comorbidities Affecting Occupational Performance: May have comorbidities impacting occupational performance    Modification or Assistance to Complete Evaluation  No modification of tasks or assist necessary to complete eval    OT Frequency 2x / week    OT Duration 12 weeks    OT Treatment/Interventions Self-care/ADL training;Therapeutic exercise;DME and/or AE instruction;Functional Mobility Training;Cognitive remediation/compensation;Balance training;Neuromuscular education;Manual Therapy;Moist Heat;Energy conservation;Passive range of motion;Therapeutic activities;Patient/family education    OT Home Exercise Plan not yet initiated at eval    Consulted and Agree with Plan of Care Patient;Family member/caregiver    Family Member Consulted spouse, Thornell Sartorius             Patient will benefit from skilled therapeutic intervention in order to improve the following deficits and impairments:   Body Structure / Function / Physical Skills: ADL, Coordination, GMC, UE functional use, Balance, Body mechanics, Decreased knowledge of use of DME, Flexibility, Dexterity, FMC, Strength, ROM Cognitive Skills: Attention, Memory     Visit Diagnosis: Muscle weakness (generalized)  Other lack of coordination  Parkinson's disease (HCC)  Problem List Patient Active Problem List   Diagnosis Date Noted   Sinus drainage 12/09/2020   Medicare annual wellness visit, subsequent 11/18/2020   Pacemaker     Dementia associated with Parkinson's disease ([place]) 06/17/2020   Vertigo 02/19/2020   Urine abnormality 02/19/2020   Dysphagia 02/23/2019   Fall at home 10/18/2017   REM behavioral disorder 11/02/2016   Radicular pain in right arm 10/09/2016   GERD (gastroesophageal reflux disease) 07/31/2016   Colon cancer screening 07/23/2015   Gout 02/18/2015   Depression 01/06/2015   Skin lesion 10/02/2014   Dupuytren's contracture 08/20/2014   Cough 07/21/2014   Lumbar stenosis with neurogenic claudication 06/13/2014   S/P deep brain stimulator placement 05/08/2014   Advance care planning 01/19/2014   Parkinson's disease ([place]) 12/13/2013   PTSD (post-traumatic stress disorder) 06/13/2013   Erectile dysfunction 06/13/2013   HLD (hyperlipidemia) 06/13/2013   Essential hypertension 06/03/2013   Bradycardia by electrocardiogram 06/03/2013   Obstructive sleep apnea 03/12/2013   Leta Speller, MS, OTR/L  Darleene Cleaver, OT 05/27/2021, 12:08 PM  [place] MAIN [facility] SERVICES [address] [place], [place], [address] Phone: [phone]   Fax:  [phone]  Name: ESTIL VALLEE MRN: [id number] Date of Birth: 21-Aug-1950

## 2021-05-27 NOTE — Progress Notes (Signed)
Remote pacemaker transmission.   

## 2021-05-31 ENCOUNTER — Encounter: Payer: Medicare PPO | Admitting: Speech Pathology

## 2021-05-31 ENCOUNTER — Other Ambulatory Visit: Payer: Self-pay

## 2021-05-31 ENCOUNTER — Ambulatory Visit: Payer: Medicare PPO | Admitting: Physical Therapy

## 2021-05-31 ENCOUNTER — Encounter: Payer: Self-pay | Admitting: Physical Therapy

## 2021-05-31 ENCOUNTER — Ambulatory Visit: Payer: Medicare PPO

## 2021-05-31 DIAGNOSIS — M6281 Muscle weakness (generalized): Secondary | ICD-10-CM | POA: Diagnosis not present

## 2021-05-31 DIAGNOSIS — G20 Parkinson's disease: Secondary | ICD-10-CM

## 2021-05-31 DIAGNOSIS — R278 Other lack of coordination: Secondary | ICD-10-CM | POA: Diagnosis not present

## 2021-05-31 DIAGNOSIS — R296 Repeated falls: Secondary | ICD-10-CM

## 2021-05-31 DIAGNOSIS — R269 Unspecified abnormalities of gait and mobility: Secondary | ICD-10-CM

## 2021-05-31 DIAGNOSIS — R262 Difficulty in walking, not elsewhere classified: Secondary | ICD-10-CM

## 2021-05-31 DIAGNOSIS — R2681 Unsteadiness on feet: Secondary | ICD-10-CM

## 2021-05-31 NOTE — Therapy (Addendum)
[place] MAIN [facility] SERVICES [address] [place], [place], [address] Phone: [phone]   Fax:  [phone]  Physical Therapy Treatment/Physical Therapy Progress Note/ Recertification note    Dates of reporting period  04/22/21   to   05/31/21   Patient Details  Name: [name] MRN: [id number] Date of Birth: 1951-01-02 Referring Provider (PT): George Stain, MD   Encounter Date: 05/31/2021   PT End of Session - 05/31/21 1102     Visit Number 40    Number of Visits 59    Date for PT Re-Evaluation 05/31/21    Authorization Type [employer] PPO    Authorization Time Period recert for 6/60-63/01    Progress Note Due on Visit 40    PT Start Time 1104    PT Stop Time 1145    PT Time Calculation (min) 41 min    Equipment Utilized During Treatment Gait belt    Activity Tolerance Patient tolerated treatment well    Behavior During Therapy [name] for tasks assessed/performed             Past Medical History:  Diagnosis Date   Arthritis    Bradycardia    Cancer ([place]) 2013   skin cancer   Depression    ptsd   Dysrhythmia    chronic slow heart rate   GERD (gastroesophageal reflux disease)    Headache(784.0)    tension headaches non recent   History of chicken pox    History of kidney stones    passed   Hypertension    treated with HCTZ   Pacemaker    Parkinson's disease ([place])    dx'ed 15 years ago   PTSD (post-traumatic stress disorder)    Shortness of breath dyspnea    Sleep apnea    doesn't use C-pap   Varicose veins     Past Surgical History:  Procedure Laterality Date   CHOLECYSTECTOMY N/A 10/22/2014   Procedure: LAPAROSCOPIC CHOLECYSTECTOMY WITH INTRAOPERATIVE CHOLANGIOGRAM;  Surgeon: Dia Crawford III, MD;  Location: [facility] ORS;  Service: General;  Laterality: N/A;   cyst removed      from lip as a child   INTRAMEDULLARY (IM) NAIL INTERTROCHANTERIC N/A 02/18/2019   Procedure: INTRAMEDULLARY (IM) NAIL  INTERTROCHANTRIC, RIGHT,;  Surgeon: Thornton Park, MD;  Location: [facility] ORS;  Service: Orthopedics;  Laterality: N/A;   LUMBAR LAMINECTOMY/DECOMPRESSION MICRODISCECTOMY Bilateral 12/14/2012   Procedure: Bilateral lumbar three-four, four-five decompressive laminotomy/foraminotomy;  Surgeon: Charlie Pitter, MD;  Location: [place] [facility];  Service: Neurosurgery;  Laterality: Bilateral;   PULSE GENERATOR IMPLANT Bilateral 12/13/2013   Procedure: Bilateral implantable pulse generator placement;  Surgeon: Erline Levine, MD;  Location: [place] [facility];  Service: Neurosurgery;  Laterality: Bilateral;  Bilateral implantable pulse generator placement   skin cancer removed     from ears,   12 lft arm  rt leg 15   SUBTHALAMIC STIMULATOR BATTERY REPLACEMENT Bilateral 07/14/2017   Procedure: BILATERAL IMPLANTED PULSE GENERATOR CHANGE FOR DEEP BRAIN STIMULATOR;  Surgeon: Erline Levine, MD;  Location: [place];  Service: Neurosurgery;  Laterality: Bilateral;   SUBTHALAMIC STIMULATOR INSERTION Bilateral 12/06/2013   Procedure: SUBTHALAMIC STIMULATOR INSERTION;  Surgeon: Erline Levine, MD;  Location: [place] [facility];  Service: Neurosurgery;  Laterality: Bilateral;  Bilateral deep brain stimulator placement    There were no vitals filed for this visit.   Subjective Assessment - 05/31/21 1448     Subjective Pt reports one fall last friday ( he thinks) that  occurred in his bedroom near his bed. Reports some back pain as well.    Patient is accompained by: Family member    Pertinent History [name] is a 49yoM who comes to [facility] OPPT neuro for evaluation of conitnued difficulty of imbalance, unsteadiness on feet, and generally limited tolerance to mobility in the setting of chronic parkinsonism. SInce DC in February, pt has had PPM placement due to bradycardia issues. Pt also started using an UpWalker device 2 week prior to this evaluation, goal was to help with gradual melting of postural extension that occured with sustained  upright actiivty. Pt has continued to practice supervised AMB with caregivers, short distances out of house and in community. Pt continues to experience frequent falls on a weekly basis, at a rate that both he and wife report to be increasing in frequency. PMH: HTN, bradycardia s/p PPM, PD, DBS, COVID-19 infection, chronic low back pain s/p L4/5 surgery.    Limitations Walking;Standing;Sitting;House hold activities;Lifting    How long can you sit comfortably? Not limited    How long can you stand comfortably? 5 minutes with support    How long can you walk comfortably? ~5-6 minutes UpWalker Walker    Patient Stated Goals Reduce falls, improve AMB tolerance    Currently in Pain? Yes    Pain Score 4     Pain Location Back    Pain Orientation Lower    Pain Descriptors / Indicators Aching    Pain Type Chronic pain    Pain Onset More than a month ago    Pain Frequency Intermittent    Pain Onset More than a month ago                [facility] PT Assessment - 05/31/21 0001       Berg Balance Test   Sit to Stand Able to stand  independently using hands    Standing Unsupported Able to stand safely 2 minutes    Sitting with Back Unsupported but Feet Supported on Floor or Stool Able to sit safely and securely 2 minutes    Stand to Sit Controls descent by using hands    Transfers Able to transfer safely, definite need of hands    Standing Unsupported with Eyes Closed Able to stand 10 seconds with supervision    Standing Unsupported with Feet Together Able to place feet together independently and stand for 1 minute with supervision    From Standing, Reach Forward with Outstretched Arm Can reach confidently >25 cm (10")    From Standing Position, Pick up Object from Floor Able to pick up shoe, needs supervision    From Standing Position, Turn to Look Behind Over each Shoulder Looks behind one side only/other side shows less weight shift    Turn 360 Degrees Able to turn 360 degrees safely one side  only in 4 seconds or less    Standing Unsupported, Alternately Place Feet on Step/Stool Able to stand independently and complete 8 steps >20 seconds    Standing Unsupported, One Foot in Front Able to plae foot ahead of the other independently and hold 30 seconds    Standing on One Leg Able to lift leg independently and hold equal to or more than 3 seconds    Total Score 44            Physical therapy treatment session today consisted of completing assessment of goals and administration of testing as demonstrated in goals section and flow sheet. Addition treatments and  tests may be found below.   6MWT with up walker 980 feet  10MWT: 1.64m/s                        PT Education - 05/31/21 1449     Education provided Yes    Education Details progress with therapy nand fall prevention strategies    Person(s) Educated Patient;Spouse    Methods Explanation    Comprehension Verbalized understanding              PT Short Term Goals - 05/31/21 1134       PT SHORT TERM GOAL #1   Title Pt to demonstrate improved sustained AMB tolerance >866ft.    Baseline At evlauation: 684ft tolerance (not to failure); 9/26: 1384 ft    Time 4    Period Weeks    Status Achieved    Target Date 01/05/21      PT SHORT TERM GOAL #2   Title Pt will improve BERG balance score to greater than 45 to indicate decrease risk of falls.    Baseline 2/13:44    Time 4    Period Weeks    Status On-going    Target Date 06/28/21      PT SHORT TERM GOAL #3   Title Pt to demonstrate 10MWT<13sec to show self-selected gait speed appropriate for AMB outside of house and to improve energy efficiency.    Baseline UpWalker Eval: 17.53sec; 9/26: 0.74 m/s (13.5)11/21: 13.68 (.73 m/s) 12.64 on 12/28 9.88 pn2/13    Time 4    Period Weeks    Status Achieved    Target Date 02/08/21               PT Long Term Goals - 05/31/21 1136       PT LONG TERM GOAL #1   Title Pt will improve BERG  balance score to greater than 47 to indicate decreaed risk of falls.    Baseline 41 on 12/28 44 on 2/13    Time 8    Period Weeks    Status On-going    Target Date 07/26/21      PT LONG TERM GOAL #2   Title Pt to tolerate 1015ft sustained AMB with UpWalker without rest interval to improve safety and independence in limited community AMB.    Baseline At eval; tired after 666ft; 9/26: 1384 ft    Time 8    Period Weeks    Status Achieved    Target Date 05/03/21      PT LONG TERM GOAL #3   Title Pt to demonstrate improved FOTO score by 5 points compared to initital assessment.    Baseline 9/26: 49 (eval was 50) 911/21: 52 12/28 deferred as wife not present and pt not best historian 2/13: 43    Time 8    Period Weeks    Status On-going    Target Date 07/26/21      PT LONG TERM GOAL #4   Title Pt to show tolerance of AMB>1316ft with UpWalker to improve tolerance to accessing the community for IADL.    Baseline Eval: 668ft;  9/26: 1385    Time 12    Period Weeks    Status Achieved    Target Date --      PT LONG TERM GOAL #5   Title Patient will increase six minute walk test distance to >1000 for progression to community ambulator and improve gait ability  Baseline 9/26: new 11/21: 850 feet with up Walker 12/28: 925 feet with up walker, 980 feet with up walker    Time 8    Period Weeks    Status On-going    Target Date 07/26/21      PT LONG TERM GOAL #6   Title Patient will experience 1 fall or less per week for at least 3 weeks in order to improve his frequency of falls and prevent injury from falls    Baseline On average multiple falls per week.    Time 12    Period Weeks    Status New    Target Date 08/23/21                   Plan - 05/31/21 1102     Clinical Impression Statement Patient presents to physical therapy for physical therapy progress note.  Patient has made good progress with his Berg balance test indicating improvement in static balance.  Patient  is also made progress with his 6-minute walk test as well as his 10 meter walk test.  Patient does however, continue to have fairly frequent falls with at least 1 fall per week.  Patient caregiver reports many of patient's falls are due to patient attention to his surroundings and safety with assistive devices.  Patient was thoroughly educated regarding how impactful thinking through his activities could be for preventing falls and preventing injuries related to falls.  Patient does have continue to make progress with his physical therapy interventions and will continue to benefit from skilled physical therapy intervention in order to improve his balance, reduce his falls, and improve his overall quality of life and improve the burden of his caregiver. Patient's condition has the potential to improve in response to therapy. Maximum improvement is yet to be obtained. The anticipated improvement is attainable and reasonable in a generally predictable time.      Personal Factors and Comorbidities Age;Past/Current Experience;Time since onset of injury/illness/exacerbation;Transportation    Comorbidities Chronic back pain, orthostatic hypotension, repeated falls, parkinsonism    Examination-Activity Limitations Bathing;Bed Mobility;Caring for Others;Bend;Dressing;Lift;Locomotion Level;Reach Overhead;Sit;Squat;Stairs;Stand;Toileting;Transfers;Carry    Examination-Participation Restrictions Cleaning;Community Activity;Interpersonal Relationship;Shop;Volunteer;Yard Work;Other;Driving    Stability/Clinical Decision Making Evolving/Moderate complexity    Rehab Potential Fair    PT Frequency 2x / week    PT Duration 12 weeks    PT Treatment/Interventions ADLs/Self Care Home Management;Aquatic Therapy;Cryotherapy;Electrical Stimulation;Iontophoresis 4mg /ml Dexamethasone;Moist Heat;Ultrasound;Contrast Bath;DME Instruction;Gait training;Stair training;Functional mobility training;Therapeutic activities;Therapeutic  exercise;Balance training;Neuromuscular re-education;Patient/family education;Manual techniques;Wheelchair mobility training;Energy conservation;Passive range of motion;Traction;Orthotic Fit/Training;Dry needling;Vestibular;Visual/perceptual remediation/compensation;Taping;Canalith Repostioning;Cognitive remediation    PT Next Visit Plan Progress static and dynamic balance and continue with caregiver/patient ed with low back stretching    PT Home Exercise Plan None set up at evaluation, still working on previous HEP from 6 months prior (STS, daily walking); 8/29: issued STS with focus on large amplitude movement at support surface; 9/12: PT provides handout with sit to stand exercise and large amplitude seated trunk rotation exercise.  9/19: reinforced HEP, provided handout for seated postural exercise with UE abduction, and seated exercise with trunk twists and UE reach, 10x for each, can be performed daily or minimum of 4x/week. No changes today.    Consulted and Agree with Plan of Care Patient;Family member/caregiver    Family Member Consulted Wife             Patient will benefit from skilled therapeutic intervention in order to improve the following deficits and impairments:  Abnormal gait, Decreased balance, Decreased  coordination, Decreased mobility, Postural dysfunction, Decreased strength, Decreased safety awareness, Improper body mechanics, Impaired flexibility, Decreased activity tolerance, Decreased endurance, Decreased knowledge of precautions, Difficulty walking, Pain, Cardiopulmonary status limiting activity, Decreased range of motion, Impaired perceived functional ability, Decreased cognition, Dizziness  Visit Diagnosis: Abnormality of gait and mobility  Difficulty in walking, not elsewhere classified  Unsteadiness on feet  Repeated falls     Problem List Patient Active Problem List   Diagnosis Date Noted   Sinus drainage 12/09/2020   Medicare annual wellness visit,  subsequent 11/18/2020   Pacemaker    Dementia associated with Parkinson's disease ([place]) 06/17/2020   Vertigo 02/19/2020   Urine abnormality 02/19/2020   Dysphagia 02/23/2019   Fall at home 10/18/2017   REM behavioral disorder 11/02/2016   Radicular pain in right arm 10/09/2016   GERD (gastroesophageal reflux disease) 07/31/2016   Colon cancer screening 07/23/2015   Gout 02/18/2015   Depression 01/06/2015   Skin lesion 10/02/2014   Dupuytren's contracture 08/20/2014   Cough 07/21/2014   Lumbar stenosis with neurogenic claudication 06/13/2014   S/P deep brain stimulator placement 05/08/2014   Advance care planning 01/19/2014   Parkinson's disease ([place]) 12/13/2013   PTSD (post-traumatic stress disorder) 06/13/2013   Erectile dysfunction 06/13/2013   HLD (hyperlipidemia) 06/13/2013   Essential hypertension 06/03/2013   Bradycardia by electrocardiogram 06/03/2013   Obstructive sleep apnea 03/12/2013    Particia Lather, PT 05/31/2021, 3:16 PM  [place] MAIN [facility] SERVICES [address] [place], [place], [address] Phone: [phone]   Fax:  [phone]  Name: [name] MRN: [id number] Date of Birth: 04-30-1950

## 2021-05-31 NOTE — Therapy (Signed)
[place] MAIN [facility] SERVICES [address] [place], [place], [address] Phone: [phone]   Fax:  [phone]  Occupational Therapy Treatment  Patient Details  Name: [name] MRN: [id number] Date of Birth: 05-16-1950 Referring Provider (OT): Dr. Sharolyn Douglas (neurologist at [facility])   Encounter Date: 05/31/2021   OT End of Session - 05/31/21 1246     Visit Number 5    Number of Visits 24    Date for OT Re-Evaluation 08/03/21    OT Start Time 0973    OT Stop Time 1230    OT Time Calculation (min) 45 min    Equipment Utilized During Treatment rollator    Activity Tolerance Patient tolerated treatment well    Behavior During Therapy [facility] for tasks assessed/performed             Past Medical History:  Diagnosis Date   Arthritis    Bradycardia    Cancer ([place]) 2013   skin cancer   Depression    ptsd   Dysrhythmia    chronic slow heart rate   GERD (gastroesophageal reflux disease)    Headache(784.0)    tension headaches non recent   History of chicken pox    History of kidney stones    passed   Hypertension    treated with HCTZ   Pacemaker    Parkinson's disease ([place])    dx'ed 15 years ago   PTSD (post-traumatic stress disorder)    Shortness of breath dyspnea    Sleep apnea    doesn't use C-pap   Varicose veins     Past Surgical History:  Procedure Laterality Date   CHOLECYSTECTOMY N/A 10/22/2014   Procedure: LAPAROSCOPIC CHOLECYSTECTOMY WITH INTRAOPERATIVE CHOLANGIOGRAM;  Surgeon: Dia Crawford III, MD;  Location: [facility] ORS;  Service: General;  Laterality: N/A;   cyst removed      from lip as a child   INTRAMEDULLARY (IM) NAIL INTERTROCHANTERIC N/A 02/18/2019   Procedure: INTRAMEDULLARY (IM) NAIL INTERTROCHANTRIC, RIGHT,;  Surgeon: Thornton Park, MD;  Location: [facility] ORS;  Service: Orthopedics;  Laterality: N/A;   LUMBAR LAMINECTOMY/DECOMPRESSION MICRODISCECTOMY Bilateral 12/14/2012   Procedure: Bilateral lumbar  three-four, four-five decompressive laminotomy/foraminotomy;  Surgeon: Charlie Pitter, MD;  Location: [place] [facility]URO ORS;  Service: Neurosurgery;  Laterality: Bilateral;   PULSE GENERATOR IMPLANT Bilateral 12/13/2013   Procedure: Bilateral implantable pulse generator placement;  Surgeon: Erline Levine, MD;  Location: [place] [facility]UR[facility] ORS;  Service: Neurosurgery;  Laterality: Bilateral;  Bilateral implantable pulse generator placement   skin cancer removed     from ears,   12 lft arm  rt leg 15   SUBTHALAMIC STIMULATOR BATTERY REPLACEMENT Bilateral 07/14/2017   Procedure: BILATERAL IMPLANTED PULSE GENERATOR CHANGE FOR DEEP BRAIN STIMULATOR;  Surgeon: Erline Levine, MD;  Location: [place];  Service: Neurosurgery;  Laterality: Bilateral;   SUBTHALAMIC STIMULATOR INSERTION Bilateral 12/06/2013   Procedure: SUBTHALAMIC STIMULATOR INSERTION;  Surgeon: Erline Levine, MD;  Location: [place] NEURO ORS;  Service: Neurosurgery;  Laterality: Bilateral;  Bilateral deep brain stimulator placement    There were no vitals filed for this visit.   Subjective Assessment - 05/31/21 1242     Subjective  "When he fell in the bathroom I think his feet were really close together.  I put "x's" on the floor now where his feet should go." (per spouse)    Patient is accompanied by: Family member    Pertinent History Parkinson's Disease Diagnosed in 2000, frequent falls    Limitations UE/LE  tremors, decreased balance, lack of coordination, intermittent diplopia (has prism glasses)    Patient Stated Goals Pt/spouse verbalize desire to improve [facility] skills for holding playing cards, and improving shoulder flexibility and posture for better walker use.    Currently in Pain? Yes    Pain Score 6     Pain Location Back    Pain Orientation Lower    Pain Descriptors / Indicators Aching    Pain Type Chronic pain    Pain Onset More than a month ago    Pain Frequency Constant    Aggravating Factors  walking, transfers    Pain Relieving Factors rest, heat     Effect of Pain on Daily Activities difficulty with ADLs and walking    Multiple Pain Sites Yes    Pain Score 6    Pain Location Shoulder    Pain Orientation Right;Left    Pain Descriptors / Indicators Aching;Sore    Pain Type Acute pain    Pain Onset In the past 7 days    Pain Frequency Intermittent    Aggravating Factors  general soreness from recent fall    Pain Relieving Factors rest, heat    Effect of Pain on Daily Activities general soreness with UB ADLs            Occupational Therapy Treatment: Self Care: Simulated spoon to mouth activity using spoon to scoop up small beads from divided plate and bring to cup positioned near mouth.  Multiple trials with only 1-2 small spills d/t spoon tilting.  Simulated cutting and spreading maneuvers with use of butter knife, fork, and pink theraputty.  Spouse reported this was a good cutting day, with pt positioning knife accurately without cues.  1 vc needed for fork positioning to stabilize putty while cutting.  OT encouraged spouse provide tactile cue when needed to reposition knife when pt tries to start knife from vertical position rather than horizontal position.    Therapeutic Exercise: Performed PROM and AAROM for bilat shoulders, including protraction/retraction/depression, and R/L PROM/AAROM for shoulder flex/abd/ER/IR and hoirz abd/add to tolerance, working to reduce stiffness after recent fall.    Response to Treatment: See Plan/clinical impression below.      OT Education - 05/31/21 1245     Education Details pain management for bilat shoulders; recommended heat or ice at home    Person(s) Educated Patient;Spouse    Methods Explanation;Verbal cues;Demonstration    Comprehension Verbalized understanding;Returned demonstration;Verbal cues required;Need further instruction              OT Short Term Goals - 05/13/21 0834       OT SHORT TERM GOAL #1   Title Pt/caregiver will be indep with HEP    Baseline Eval:  not yet initiated    Time 6    Period Weeks    Status New    Target Date 06/22/21               OT Long Term Goals - 05/13/21 0836       OT LONG TERM GOAL #1   Title Pt will improve FOTO score to 40 or better to indicate increased functional improvement.    Baseline Eval: FOTO 34    Time 12    Period Weeks    Status New    Target Date 08/03/21      OT LONG TERM GOAL #2   Title Pt will improve R lateral pinch strength to be able to tolerate holding playing cards in  R dominant hand for game of cards.    Baseline Eval: R lateral pinch 16 lbs; pt enjoys playing cards but is not currently playing d/t decreased strength/coordination    Time 12    Period Weeks    Status New    Target Date 08/03/21      OT LONG TERM GOAL #3   Title Pt will improve R dominant hand to mouth coordination to self feed with minimal spilling and utilize adapted utensils as needed.    Baseline Eval: Spouse reports eating is messy and pt struggles to keep utensil level.    Time 12    Period Weeks    Status New    Target Date 08/03/21      OT LONG TERM GOAL #4   Title Pt will improve bilat shoulder flex/abd for improved overhead reach during self care.    Baseline Eval: R shoulder flex 110, L 120, R abd 125, L 165    Time 12    Period Weeks    Status New    Target Date 08/03/21      OT LONG TERM GOAL #5   Title Pt will improve [place] in bilat hands as noted by improvement in 9 hole peg test score by 5 or more seconds to enable participate in family board games.    Baseline Eval: R/L 9 hole peg test 41 secs (pt has not recently participated in board games)    Time 12    Period Weeks    Status New    Target Date 08/03/21      Long Term Additional Goals   Additional Long Term Goals Yes      OT LONG TERM GOAL #6   Title Pt will be able to initiate simple texts and reply to texts on phone with min vc.    Baseline Eval: Pt struggles with texting    Time 12    Period Weeks    Status New     Target Date 08/03/21      OT LONG TERM GOAL #7   Title Pt will improve posture for ADLs and functional mobility as demonstrated by ability to keep feet within back two legs of rollator 50% of the time with min vc.    Baseline Eval: pt with forward lean and feet are behind back 2 legs of walker during functional mobility.    Time 12    Status New    Target Date 08/03/21               Plan - 05/31/21 1257     Clinical Impression Statement Completed multiple trials for spoon to mouth simulation with only 1-2 small spills d/t spoon tilting.  Spouse reported this was a good cutting day, with pt positioning knife accurately without cues.  1 vc needed for fork positioning to stabilize putty while cutting.  OT encouraged spouse provide tactile cue when needed to reposition knife when pt tries to start knife from vertical position rather than horizontal position.  Pt had 1 fall over the weekend in the bathroom.  Spouse reports that she thinks he tried to stand up with his feet too close together, but spouse has since taped down 2 x's on the floor where feet should be positioned, and pt feels this helps "some of the time."  OT encouraged spouse provide supv in the bathroom before pt performs sit to stand transfer so that she can provide cues as needed to position feet atop the  x's, until this become routine for pt.  Spouse receptive.  Pt had some soreness in bilat shoulders today d/t this recent fall in the bathroom, but tolerated ROM well today and OT encouraged pt use heat or ice at home today as needed for pain management.  Pt will continue to benefit from skilled OT for ADL training, compensatory ADL techniques, and therapeutic exercise to increase bilat hand coordination to better manipulate ADL supplies, and increase bilat shoulder flexibility and standing posture for overhead reach and functional mobility.    OT Occupational Profile and History Problem Focused Assessment - Including review of records  relating to presenting problem    Occupational performance deficits (Please refer to evaluation for details): ADL's;IADL's;Leisure    Body Structure / Function / Physical Skills ADL;Coordination;GMC;UE functional use;Balance;Body mechanics;Decreased knowledge of use of DME;Flexibility;Dexterity;FMC;Strength;ROM    Cognitive Skills Attention;Memory    Rehab Potential Good    Clinical Decision Making Several treatment options, min-mod task modification necessary    Comorbidities Affecting Occupational Performance: May have comorbidities impacting occupational performance    Modification or Assistance to Complete Evaluation  No modification of tasks or assist necessary to complete eval    OT Frequency 2x / week    OT Duration 12 weeks    OT Treatment/Interventions Self-care/ADL training;Therapeutic exercise;DME and/or AE instruction;Functional Mobility Training;Cognitive remediation/compensation;Balance training;Neuromuscular education;Manual Therapy;Moist Heat;Energy conservation;Passive range of motion;Therapeutic activities;Patient/family education    OT Home Exercise Plan not yet initiated at eval    Consulted and Agree with Plan of Care Patient;Family member/caregiver    Family Member Consulted spouse, [name]             Patient will benefit from skilled therapeutic intervention in order to improve the following deficits and impairments:   Body Structure / Function / Physical Skills: ADL, Coordination, GMC, UE functional use, Balance, Body mechanics, Decreased knowledge of use of DME, Flexibility, Dexterity, FMC, Strength, ROM Cognitive Skills: Attention, Memory     Visit Diagnosis: Muscle weakness (generalized)  Other lack of coordination  Parkinson's disease ([place])    Problem List Patient Active Problem List   Diagnosis Date Noted   Sinus drainage 12/09/2020   Medicare annual wellness visit, subsequent 11/18/2020   Pacemaker    Dementia associated with Parkinson's disease  ([place]) 06/17/2020   Vertigo 02/19/2020   Urine abnormality 02/19/2020   Dysphagia 02/23/2019   Fall at home 10/18/2017   REM behavioral disorder 11/02/2016   Radicular pain in right arm 10/09/2016   GERD (gastroesophageal reflux disease) 07/31/2016   Colon cancer screening 07/23/2015   Gout 02/18/2015   Depression 01/06/2015   Skin lesion 10/02/2014   Dupuytren's contracture 08/20/2014   Cough 07/21/2014   Lumbar stenosis with neurogenic claudication 06/13/2014   S/P deep brain stimulator placement 05/08/2014   Advance care planning 01/19/2014   Parkinson's disease ([place]) 12/13/2013   PTSD (post-traumatic stress disorder) 06/13/2013   Erectile dysfunction 06/13/2013   HLD (hyperlipidemia) 06/13/2013   Essential hypertension 06/03/2013   Bradycardia by electrocardiogram 06/03/2013   Obstructive sleep apnea 03/12/2013   Leta Speller, MS, OTR/L  Darleene Cleaver, OT 05/31/2021, 12:57 PM  [place] MAIN [facility] SERVICES [address] [place], [place], [address] Phone: [phone]   Fax:  [phone]  Name: JAEL KOSTICK MRN: [id number] Date of Birth: 1951/03/24

## 2021-05-31 NOTE — Addendum Note (Signed)
Addended by: Rivka Barbara B on: 05/31/2021 03:17 PM   Modules accepted: Orders

## 2021-06-01 ENCOUNTER — Ambulatory Visit (INDEPENDENT_AMBULATORY_CARE_PROVIDER_SITE_OTHER): Payer: Medicare PPO | Admitting: Psychology

## 2021-06-01 DIAGNOSIS — F4323 Adjustment disorder with mixed anxiety and depressed mood: Secondary | ICD-10-CM | POA: Diagnosis not present

## 2021-06-01 DIAGNOSIS — F331 Major depressive disorder, recurrent, moderate: Secondary | ICD-10-CM

## 2021-06-01 NOTE — Progress Notes (Signed)
[place] Counselor/Therapist Progress Note  Patient ID: [name], MRN: [id number],    Date: 06/01/2021  Time Spent: 45 minutes  Treatment Type: Individual Therapy  Reported Symptoms: sadness, and boredom  Mental Status Exam: Appearance:  Casual     Behavior: Drowsy  Motor: Psychomotor Retardation  Speech/Language:  Slow  Affect: Blunt  Mood: normal  Thought process: concrete  Thought content:   WNL  Sensory/Perceptual disturbances:   WNL  Orientation: oriented to person, place, time/date, and situation  Attention: Fair  Concentration: Fair  Memory: Immediate;   [place] of knowledge:  Fair  Insight:   Fair  Judgment:  Fair  Impulse Control: Fair   Risk Assessment: Danger to Self:  No Self-injurious Behavior: No Danger to Others: No Duty to Warn:no Physical Aggression / Violence:No  Access to Firearms a concern: No  Gang Involvement:No   Subjective: The patient attended a face-to-face individual therapy session via video visit today.  The patient gave verbal consent for the video to be on WebEx.  The patient was in his home alone and therapist was in the office.  The patient presents with a flat affect and mood is pleasant.  The patient reports that they changed some of his medications for his Parkinsons and hopefully this will help him not to fall as much.  We discussed what he has been doing to take care of himself and he states that mostly he has been staying home.  We discussed what is going on in his life.  He seems to be managing his depression relatively well at present.  Interventions: Ego-Supportive, CBT  Diagnosis:Major depressive disorder, recurrent episode, moderate (HCC)  Adjustment disorder with mixed anxiety and depressed mood  Plan: Please see Treatment plan in Therapy Charts with target date of 04/30/2022 for goals and progress towards goals.  Plan is to continue to offer supportive therapy to patient to help him deal with his  depression related to his Parkinsons diagnosis.  Patient approved Treatment Plan in Therapy Charts.  Tamantha Saline G Briggs Edelen, LCSW                  Toua Stites G Akhila Mahnken, LCSW               Geronimo Diliberto G Desarai Barrack, LCSW

## 2021-06-02 ENCOUNTER — Ambulatory Visit: Payer: Medicare PPO | Admitting: Physical Therapy

## 2021-06-02 ENCOUNTER — Encounter: Payer: Medicare PPO | Admitting: Speech Pathology

## 2021-06-02 ENCOUNTER — Ambulatory Visit: Payer: Medicare PPO

## 2021-06-02 ENCOUNTER — Other Ambulatory Visit: Payer: Self-pay

## 2021-06-02 DIAGNOSIS — R262 Difficulty in walking, not elsewhere classified: Secondary | ICD-10-CM | POA: Diagnosis not present

## 2021-06-02 DIAGNOSIS — G20 Parkinson's disease: Secondary | ICD-10-CM | POA: Diagnosis not present

## 2021-06-02 DIAGNOSIS — R269 Unspecified abnormalities of gait and mobility: Secondary | ICD-10-CM

## 2021-06-02 DIAGNOSIS — R296 Repeated falls: Secondary | ICD-10-CM | POA: Diagnosis not present

## 2021-06-02 DIAGNOSIS — R2681 Unsteadiness on feet: Secondary | ICD-10-CM

## 2021-06-02 DIAGNOSIS — R278 Other lack of coordination: Secondary | ICD-10-CM

## 2021-06-02 DIAGNOSIS — M6281 Muscle weakness (generalized): Secondary | ICD-10-CM

## 2021-06-02 NOTE — Therapy (Signed)
[place] MAIN [facility] SERVICES [address] [place], [place], [address] Phone: [phone]   Fax:  [phone]  Physical Therapy Treatment  Patient Details  Name: [name] MRN: [id number] Date of Birth: 01-30-1951 Referring Provider (PT): Elsie Stain, MD   Encounter Date: 06/02/2021   PT End of Session - 06/02/21 1107     Visit Number 41    Number of Visits 48    Date for PT Re-Evaluation 08/23/21    Authorization Type [employer] Choice PPO    Authorization Time Period recert for 6/38-46/65    Progress Note Due on Visit 40    PT Start Time 1104    PT Stop Time 1144    PT Time Calculation (min) 40 min    Equipment Utilized During Treatment Gait belt    Activity Tolerance Patient tolerated treatment well    Behavior During Therapy WFL for tasks assessed/performed             Past Medical History:  Diagnosis Date   Arthritis    Bradycardia    Cancer ([place]) 2013   skin cancer   Depression    ptsd   Dysrhythmia    chronic slow heart rate   GERD (gastroesophageal reflux disease)    Headache(784.0)    tension headaches non recent   History of chicken pox    History of kidney stones    passed   Hypertension    treated with HCTZ   Pacemaker    Parkinson's disease ([place])    dx'ed 15 years ago   PTSD (post-traumatic stress disorder)    Shortness of breath dyspnea    Sleep apnea    doesn't use C-pap   Varicose veins     Past Surgical History:  Procedure Laterality Date   CHOLECYSTECTOMY N/A 10/22/2014   Procedure: LAPAROSCOPIC CHOLECYSTECTOMY WITH INTRAOPERATIVE CHOLANGIOGRAM;  Surgeon: Dia Crawford III, MD;  Location: [facility] ORS;  Service: General;  Laterality: N/A;   cyst removed      from lip as a child   INTRAMEDULLARY (IM) NAIL INTERTROCHANTERIC N/A 02/18/2019   Procedure: INTRAMEDULLARY (IM) NAIL INTERTROCHANTRIC, RIGHT,;  Surgeon: Thornton Park, MD;  Location: [facility] ORS;  Service: Orthopedics;  Laterality:  N/A;   LUMBAR LAMINECTOMY/DECOMPRESSION MICRODISCECTOMY Bilateral 12/14/2012   Procedure: Bilateral lumbar three-four, four-five decompressive laminotomy/foraminotomy;  Surgeon: Charlie Pitter, MD;  Location: [place] [facility];  Service: Neurosurgery;  Laterality: Bilateral;   PULSE GENERATOR IMPLANT Bilateral 12/13/2013   Procedure: Bilateral implantable pulse generator placement;  Surgeon: Erline Levine, MD;  Location: [place] [facility];  Service: Neurosurgery;  Laterality: Bilateral;  Bilateral implantable pulse generator placement   skin cancer removed     from ears,   12 lft arm  rt leg 15   SUBTHALAMIC STIMULATOR BATTERY REPLACEMENT Bilateral 07/14/2017   Procedure: BILATERAL IMPLANTED PULSE GENERATOR CHANGE FOR DEEP BRAIN STIMULATOR;  Surgeon: Erline Levine, MD;  Location: [place];  Service: Neurosurgery;  Laterality: Bilateral;   SUBTHALAMIC STIMULATOR INSERTION Bilateral 12/06/2013   Procedure: SUBTHALAMIC STIMULATOR INSERTION;  Surgeon: Erline Levine, MD;  Location: [place] [facility];  Service: Neurosurgery;  Laterality: Bilateral;  Bilateral deep brain stimulator placement    There were no vitals filed for this visit.   Subjective Assessment - 06/02/21 1107     Subjective Pt reports starting new medication earlier this week. Tremors seem to have improved.    Patient is accompained by: Family member    Pertinent History [name] is a  68yoM who comes to [place] neuro for evaluation of conitnued difficulty of imbalance, unsteadiness on feet, and generally limited tolerance to mobility in the setting of chronic parkinsonism. SInce DC in February, pt has had PPM placement due to bradycardia issues. Pt also started using an UpWalker device 2 week prior to this evaluation, goal was to help with gradual melting of postural extension that occured with sustained upright actiivty. Pt has continued to practice supervised AMB with caregivers, short distances out of house and in community. Pt continues to experience  frequent falls on a weekly basis, at a rate that both he and wife report to be increasing in frequency. PMH: HTN, bradycardia s/p PPM, PD, DBS, COVID-19 infection, chronic low back pain s/p L4/5 surgery.    Limitations Walking;Standing;Sitting;House hold activities;Lifting    How long can you sit comfortably? Not limited    How long can you stand comfortably? 5 minutes with support    How long can you walk comfortably? ~5-6 minutes UpWalker Walker    Patient Stated Goals Reduce falls, improve AMB tolerance    Pain Onset More than a month ago    Pain Onset More than a month ago                 INTERVENTIONS   Exercise/Activity Sets/Reps/Time/ Resistance Assistance Charge type Comments- Unless otherwise stated, CGA was provided and gait belt donned in order to ensure pt safety               Stair taps to inch step with TB in between feet for external cue to prevent NBOS  3 x 10 x ea LE CGA Gait training  Cues for large amplitude movement, " buzzer" when pt hit green TB for knowledge of performance throughout.   SLS progression- 1 LE on airex, contral on 6 in step plus airex 2 x 30 sec ea   Therex Cues for maintaining knee extension and posture, particularly with R LE posterior/as primary stance LE. Difficulty maintaining TKE on the R > L   LAQ 2 x 10 x 5# on ea LE   There ex To ipmrove knee extension strength   STS on airex pad  2 x 10  CGA, hands on knees intermittently  There ACT 1 LOB posterior and corrected via PT to prevent collapse into chair  To improve STS ability on various surfaces and at decreased seat depth  Squat with 1 LE on airex  1 x 10 ea LE on airex  CGA  There ex   Walking in clinic without weights on up walker  X several minutes  CGA  Gait training Cues for turning on command, cues for proper BOS.                     Treatment Provided this session   Pt educated throughout session about proper posture and technique with exercises. Improved exercise technique,  movement at target joints, use of target muscles after min to mod verbal, visual, tactile cues.  Note: Portions of this document were prepared using Dragon voice recognition software and although reviewed may contain unintentional dictation errors in syntax, grammar, or spelling.                           PT Education - 06/02/21 1456     Education provided Yes    Education Details Ambualtion training with up walker    Person(s) Educated Patient;Spouse  Methods Explanation    Comprehension Verbalized understanding              PT Short Term Goals - 05/31/21 1134       PT SHORT TERM GOAL #1   Title Pt to demonstrate improved sustained AMB tolerance >88ft.    Baseline At evlauation: 698ft tolerance (not to failure); 9/26: 1384 ft    Time 4    Period Weeks    Status Achieved    Target Date 01/05/21      PT SHORT TERM GOAL #2   Title Pt will improve BERG balance score to greater than 45 to indicate decrease risk of falls.    Baseline 2/13:44    Time 4    Period Weeks    Status On-going    Target Date 06/28/21      PT SHORT TERM GOAL #3   Title Pt to demonstrate 10MWT<13sec to show self-selected gait speed appropriate for AMB outside of house and to improve energy efficiency.    Baseline UpWalker Eval: 17.53sec; 9/26: 0.74 m/s (13.5)11/21: 13.68 (.73 m/s) 12.64 on 12/28 9.88 pn2/13    Time 4    Period Weeks    Status Achieved    Target Date 02/08/21               PT Long Term Goals - 05/31/21 1136       PT LONG TERM GOAL #1   Title Pt will improve BERG balance score to greater than 47 to indicate decreaed risk of falls.    Baseline 41 on 12/28 44 on 2/13    Time 8    Period Weeks    Status On-going    Target Date 07/26/21      PT LONG TERM GOAL #2   Title Pt to tolerate 1052ft sustained AMB with UpWalker without rest interval to improve safety and independence in limited community AMB.    Baseline At eval; tired after 65ft; 9/26:  1384 ft    Time 8    Period Weeks    Status Achieved    Target Date 05/03/21      PT LONG TERM GOAL #3   Title Pt to demonstrate improved FOTO score by 5 points compared to initital assessment.    Baseline 9/26: 49 (eval was 50) 911/21: 52 12/28 deferred as wife not present and pt not best historian 2/13: 43    Time 8    Period Weeks    Status On-going    Target Date 07/26/21      PT LONG TERM GOAL #4   Title Pt to show tolerance of AMB>1363ft with UpWalker to improve tolerance to accessing the community for IADL.    Baseline Eval: 668ft;  9/26: 1385    Time 12    Period Weeks    Status Achieved    Target Date --      PT LONG TERM GOAL #5   Title Patient will increase six minute walk test distance to >1000 for progression to community ambulator and improve gait ability    Baseline 9/26: new 11/21: 850 feet with up Walker 12/28: 925 feet with up walker, 980 feet with up walker    Time 8    Period Weeks    Status On-going    Target Date 07/26/21      PT LONG TERM GOAL #6   Title Patient will experience 1 fall or less per week for at least 3 weeks in order to improve  his frequency of falls and prevent injury from falls    Baseline On average multiple falls per week.    Time 12    Period Weeks    Status New    Target Date 08/23/21                   Plan - 06/02/21 1108     Clinical Impression Statement Pt presents with good motivation for completeion of PT program. Pt progresed with SLS progressions to improve R LE balance and stability.  Patient was able to ambulate with up walker without typical ankle weights donned to base of up walker.  Practiced mini turns as well as working on keeping feet apart when ambulating to increase base of support.  Patient tolerated all interventions well with no increase in lower back pain.  Patient will continue to benefit from skilled physical therapy intervention in order to improve his lower extremity strength, balance, ambulatory  capacity, and safety with activities.    Personal Factors and Comorbidities Age;Past/Current Experience;Time since onset of injury/illness/exacerbation;Transportation    Comorbidities Chronic back pain, orthostatic hypotension, repeated falls, parkinsonism    Examination-Activity Limitations Bathing;Bed Mobility;Caring for Others;Bend;Dressing;Lift;Locomotion Level;Reach Overhead;Sit;Squat;Stairs;Stand;Toileting;Transfers;Carry    Examination-Participation Restrictions Cleaning;Community Activity;Interpersonal Relationship;Shop;Volunteer;Yard Work;Other;Driving    Stability/Clinical Decision Making Evolving/Moderate complexity    Rehab Potential Fair    PT Frequency 2x / week    PT Duration 12 weeks    PT Treatment/Interventions ADLs/Self Care Home Management;Aquatic Therapy;Cryotherapy;Electrical Stimulation;Iontophoresis 4mg /ml Dexamethasone;Moist Heat;Ultrasound;Contrast Bath;DME Instruction;Gait training;Stair training;Functional mobility training;Therapeutic activities;Therapeutic exercise;Balance training;Neuromuscular re-education;Patient/family education;Manual techniques;Wheelchair mobility training;Energy conservation;Passive range of motion;Traction;Orthotic Fit/Training;Dry needling;Vestibular;Visual/perceptual remediation/compensation;Taping;Canalith Repostioning;Cognitive remediation    PT Next Visit Plan Progress static and dynamic balance and continue with caregiver/patient ed with low back stretching    PT Home Exercise Plan None set up at evaluation, still working on previous HEP from 6 months prior (STS, daily walking); 8/29: issued STS with focus on large amplitude movement at support surface; 9/12: PT provides handout with sit to stand exercise and large amplitude seated trunk rotation exercise.  9/19: reinforced HEP, provided handout for seated postural exercise with UE abduction, and seated exercise with trunk twists and UE reach, 10x for each, can be performed daily or minimum of  4x/week. No changes today.    Consulted and Agree with Plan of Care Patient;Family member/caregiver    Family Member Consulted Wife             Patient will benefit from skilled therapeutic intervention in order to improve the following deficits and impairments:  Abnormal gait, Decreased balance, Decreased coordination, Decreased mobility, Postural dysfunction, Decreased strength, Decreased safety awareness, Improper body mechanics, Impaired flexibility, Decreased activity tolerance, Decreased endurance, Decreased knowledge of precautions, Difficulty walking, Pain, Cardiopulmonary status limiting activity, Decreased range of motion, Impaired perceived functional ability, Decreased cognition, Dizziness  Visit Diagnosis: Abnormality of gait and mobility  Difficulty in walking, not elsewhere classified  Unsteadiness on feet  Repeated falls     Problem List Patient Active Problem List   Diagnosis Date Noted   Sinus drainage 12/09/2020   Medicare annual wellness visit, subsequent 11/18/2020   Pacemaker    Dementia associated with Parkinson's disease ([place]) 06/17/2020   Vertigo 02/19/2020   Urine abnormality 02/19/2020   Dysphagia 02/23/2019   Fall at home 10/18/2017   REM behavioral disorder 11/02/2016   Radicular pain in right arm 10/09/2016   GERD (gastroesophageal reflux disease) 07/31/2016   Colon cancer screening 07/23/2015   Gout 02/18/2015  Depression 01/06/2015   Skin lesion 10/02/2014   Dupuytren's contracture 08/20/2014   Cough 07/21/2014   Lumbar stenosis with neurogenic claudication 06/13/2014   S/P deep brain stimulator placement 05/08/2014   Advance care planning 01/19/2014   Parkinson's disease ([place]) 12/13/2013   PTSD (post-traumatic stress disorder) 06/13/2013   Erectile dysfunction 06/13/2013   HLD (hyperlipidemia) 06/13/2013   Essential hypertension 06/03/2013   Bradycardia by electrocardiogram 06/03/2013   Obstructive sleep apnea 03/12/2013     Particia Lather, PT 06/02/2021, 3:00 PM  [place] [address] [place], [place], [address] Phone: [phone]   Fax:  [phone]  Name: VICTORIANO CAMPION MRN: [id number] Date of Birth: 1950/06/16

## 2021-06-02 NOTE — Therapy (Signed)
[place] MAIN [facility] SERVICES [address] [place], [place], [address] Phone: [phone]   Fax:  [phone]  Occupational Therapy Treatment  Patient Details  Name: [name] MRN: [id number] Date of Birth: 07-09-1950 Referring Provider (OT): Dr. Sharolyn Douglas (neurologist at [facility])   Encounter Date: 06/02/2021   OT End of Session - 06/02/21 1333     Visit Number 6    Number of Visits 24    Date for OT Re-Evaluation 08/03/21    OT Start Time 6962    OT Stop Time 1230    OT Time Calculation (min) 45 min    Equipment Utilized During Treatment rollator    Activity Tolerance Patient tolerated treatment well    Behavior During Therapy [facility] for tasks assessed/performed             Past Medical History:  Diagnosis Date   Arthritis    Bradycardia    Cancer ([place]) 2013   skin cancer   Depression    ptsd   Dysrhythmia    chronic slow heart rate   GERD (gastroesophageal reflux disease)    Headache(784.0)    tension headaches non recent   History of chicken pox    History of kidney stones    passed   Hypertension    treated with HCTZ   Pacemaker    Parkinson's disease ([place])    dx'ed 15 years ago   PTSD (post-traumatic stress disorder)    Shortness of breath dyspnea    Sleep apnea    doesn't use C-pap   Varicose veins     Past Surgical History:  Procedure Laterality Date   CHOLECYSTECTOMY N/A 10/22/2014   Procedure: LAPAROSCOPIC CHOLECYSTECTOMY WITH INTRAOPERATIVE CHOLANGIOGRAM;  Surgeon: Dia Crawford III, MD;  Location: [facility] ORS;  Service: General;  Laterality: N/A;   cyst removed      from lip as a child   INTRAMEDULLARY (IM) NAIL INTERTROCHANTERIC N/A 02/18/2019   Procedure: INTRAMEDULLARY (IM) NAIL INTERTROCHANTRIC, RIGHT,;  Surgeon: Thornton Park, MD;  Location: [facility] ORS;  Service: Orthopedics;  Laterality: N/A;   LUMBAR LAMINECTOMY/DECOMPRESSION MICRODISCECTOMY Bilateral 12/14/2012   Procedure: Bilateral lumbar  three-four, four-five decompressive laminotomy/foraminotomy;  Surgeon: Charlie Pitter, MD;  Location: [place] [facility]UR[facility] ORS;  Service: Neurosurgery;  Laterality: Bilateral;   PULSE GENERATOR IMPLANT Bilateral 12/13/2013   Procedure: Bilateral implantable pulse generator placement;  Surgeon: Erline Levine, MD;  Location: [place] [facility]URO ORS;  Service: Neurosurgery;  Laterality: Bilateral;  Bilateral implantable pulse generator placement   skin cancer removed     from ears,   12 lft arm  rt leg 15   SUBTHALAMIC STIMULATOR BATTERY REPLACEMENT Bilateral 07/14/2017   Procedure: BILATERAL IMPLANTED PULSE GENERATOR CHANGE FOR DEEP BRAIN STIMULATOR;  Surgeon: Erline Levine, MD;  Location: [place];  Service: Neurosurgery;  Laterality: Bilateral;   SUBTHALAMIC STIMULATOR INSERTION Bilateral 12/06/2013   Procedure: SUBTHALAMIC STIMULATOR INSERTION;  Surgeon: Erline Levine, MD;  Location: [place] NEURO ORS;  Service: Neurosurgery;  Laterality: Bilateral;  Bilateral deep brain stimulator placement    There were no vitals filed for this visit.   Subjective Assessment - 06/02/21 1330     Subjective  "It took me about 45 min to send a text because my typing got deleted."    Patient is accompanied by: Family member    Pertinent History Parkinson's Disease Diagnosed in 2000, frequent falls    Limitations UE/LE tremors, decreased balance, lack of coordination, intermittent diplopia (has prism glasses)  Patient Stated Goals Pt/spouse verbalize desire to improve [facility] skills for holding playing cards, and improving shoulder flexibility and posture for better walker use.    Currently in Pain? Yes    Pain Score 4     Pain Location Back    Pain Orientation Lower    Pain Descriptors / Indicators Aching    Pain Type Chronic pain    Pain Onset More than a month ago    Pain Frequency Intermittent    Aggravating Factors  walking, transfers    Pain Relieving Factors rest, heat    Effect of Pain on Daily Activities difficulty with ADLs and  walking    Multiple Pain Sites Yes    Pain Score 6    Pain Location Shoulder    Pain Orientation Right;Left    Pain Descriptors / Indicators Aching;Sore    Pain Type Acute pain    Pain Onset In the past 7 days    Pain Frequency Intermittent    Aggravating Factors  general soreness from recent fall    Pain Relieving Factors rest, heat    Effect of Pain on Daily Activities general soreness with UB ADLs            Occupational Therapy Treatment: Self Care: Practiced texting on cell phone to send simple text communication to friend.  Pt continues to improve accuracy to hit keys using stylus instead of fingertip.  Pt required occasional reminders to use short cut technique using word anticipation on phone.  OT reviewed screen orientation with use of text icon vs phone icon and rearranged icons to place texting icon where it may be likely more visible to pt, as he still tends to hit the "call" icon instead of the "text" icon in prep for texting.  Provided max sequencing cues on how to add a new contact.    Therapeutic Exercise: Instructed pt in core strengthening exercises sitting edge of mat table (initially attempted standing against the wall for exercises but pt required significant assist to maintain balance when hands were off the walker).  Worked side bending to the L with R arm stretched overhead and L hand sliding on mat away from torso (back and forth) x10, forward reaching with BUEs with transition to overhead reach x10, and reach to feet with transition to reach overhead x10.  Pt required intermittent min A to return to midline when reaching overhead d/t pt tends to lean to the R.    Response to Treatment: See Plan/clinical impression below.    OT Education - 06/02/21 1333     Education Details core strengthening    Person(s) Educated Patient;Spouse    Methods Explanation;Verbal cues;Demonstration    Comprehension Verbalized understanding;Returned demonstration;Verbal cues  required;Need further instruction              OT Short Term Goals - 05/13/21 0834       OT SHORT TERM GOAL #1   Title Pt/caregiver will be indep with HEP    Baseline Eval: not yet initiated    Time 6    Period Weeks    Status New    Target Date 06/22/21               OT Long Term Goals - 05/13/21 0836       OT LONG TERM GOAL #1   Title Pt will improve FOTO score to 40 or better to indicate increased functional improvement.    Baseline Eval: FOTO 34    Time  12    Period Weeks    Status New    Target Date 08/03/21      OT LONG TERM GOAL #2   Title Pt will improve R lateral pinch strength to be able to tolerate holding playing cards in R dominant hand for game of cards.    Baseline Eval: R lateral pinch 16 lbs; pt enjoys playing cards but is not currently playing d/t decreased strength/coordination    Time 12    Period Weeks    Status New    Target Date 08/03/21      OT LONG TERM GOAL #3   Title Pt will improve R dominant hand to mouth coordination to self feed with minimal spilling and utilize adapted utensils as needed.    Baseline Eval: Spouse reports eating is messy and pt struggles to keep utensil level.    Time 12    Period Weeks    Status New    Target Date 08/03/21      OT LONG TERM GOAL #4   Title Pt will improve bilat shoulder flex/abd for improved overhead reach during self care.    Baseline Eval: R shoulder flex 110, L 120, R abd 125, L 165    Time 12    Period Weeks    Status New    Target Date 08/03/21      OT LONG TERM GOAL #5   Title Pt will improve [place] in bilat hands as noted by improvement in 9 hole peg test score by 5 or more seconds to enable participate in family board games.    Baseline Eval: R/L 9 hole peg test 41 secs (pt has not recently participated in board games)    Time 12    Period Weeks    Status New    Target Date 08/03/21      Long Term Additional Goals   Additional Long Term Goals Yes      OT LONG TERM GOAL #6    Title Pt will be able to initiate simple texts and reply to texts on phone with min vc.    Baseline Eval: Pt struggles with texting    Time 12    Period Weeks    Status New    Target Date 08/03/21      OT LONG TERM GOAL #7   Title Pt will improve posture for ADLs and functional mobility as demonstrated by ability to keep feet within back two legs of rollator 50% of the time with min vc.    Baseline Eval: pt with forward lean and feet are behind back 2 legs of walker during functional mobility.    Time 12    Status New    Target Date 08/03/21             Plan - 06/02/21 1349     Clinical Impression Statement Initiated core strengthening exercises with good tolerance, requiring constant tactile/verbal cues and min A for technique.  Pt is consistently following home program for sending a daily text, but pt reports that the other day it took him 45 min to send a short text (several sentences) after he accidentally deleted the sentences.  Pt is also occasionally confusing call vs text icons and requires occasional cueing to go back and forth between various screens on his phone.  ?Pt will continue to benefit from skilled OT for ADL training, compensatory ADL techniques, and therapeutic exercise to increase bilat hand coordination to better manipulate ADL supplies, and  increase bilat shoulder flexibility and standing posture for overhead reach and functional mobility, as well as improving indep with texting on phone to improve efficient communication with friends and family.    OT Occupational Profile and History Problem Focused Assessment - Including review of records relating to presenting problem    Occupational performance deficits (Please refer to evaluation for details): ADL's;IADL's;Leisure    Body Structure / Function / Physical Skills ADL;Coordination;GMC;UE functional use;Balance;Body mechanics;Decreased knowledge of use of DME;Flexibility;Dexterity;FMC;Strength;ROM    Cognitive Skills  Attention;Memory    Rehab Potential Good    Clinical Decision Making Several treatment options, min-mod task modification necessary    Comorbidities Affecting Occupational Performance: May have comorbidities impacting occupational performance    Modification or Assistance to Complete Evaluation  No modification of tasks or assist necessary to complete eval    OT Frequency 2x / week    OT Duration 12 weeks    OT Treatment/Interventions Self-care/ADL training;Therapeutic exercise;DME and/or AE instruction;Functional Mobility Training;Cognitive remediation/compensation;Balance training;Neuromuscular education;Manual Therapy;Moist Heat;Energy conservation;Passive range of motion;Therapeutic activities;Patient/family education    OT Home Exercise Plan not yet initiated at eval    Consulted and Agree with Plan of Care Patient;Family member/caregiver    Family Member Consulted spouse, [name]             Patient will benefit from skilled therapeutic intervention in order to improve the following deficits and impairments:   Body Structure / Function / Physical Skills: ADL, Coordination, GMC, UE functional use, Balance, Body mechanics, Decreased knowledge of use of DME, Flexibility, Dexterity, FMC, Strength, ROM Cognitive Skills: Attention, Memory     Visit Diagnosis: Muscle weakness (generalized)  Other lack of coordination  Parkinson's disease ([place])    Problem List Patient Active Problem List   Diagnosis Date Noted   Sinus drainage 12/09/2020   Medicare annual wellness visit, subsequent 11/18/2020   Pacemaker    Dementia associated with Parkinson's disease ([place]) 06/17/2020   Vertigo 02/19/2020   Urine abnormality 02/19/2020   Dysphagia 02/23/2019   Fall at home 10/18/2017   REM behavioral disorder 11/02/2016   Radicular pain in right arm 10/09/2016   GERD (gastroesophageal reflux disease) 07/31/2016   Colon cancer screening 07/23/2015   Gout 02/18/2015   Depression 01/06/2015    Skin lesion 10/02/2014   Dupuytren's contracture 08/20/2014   Cough 07/21/2014   Lumbar stenosis with neurogenic claudication 06/13/2014   S/P deep brain stimulator placement 05/08/2014   Advance care planning 01/19/2014   Parkinson's disease ([place]) 12/13/2013   PTSD (post-traumatic stress disorder) 06/13/2013   Erectile dysfunction 06/13/2013   HLD (hyperlipidemia) 06/13/2013   Essential hypertension 06/03/2013   Bradycardia by electrocardiogram 06/03/2013   Obstructive sleep apnea 03/12/2013   Leta Speller, MS, OTR/L  Darleene Cleaver, OT 06/02/2021, 1:50 PM  [place] MAIN [facility] SERVICES [place], [place], [address] Phone: [phone]   Fax:  [phone]  Name: [name] MRN: [id number] Date of Birth: May 29, 1950

## 2021-06-04 NOTE — Progress Notes (Signed)
Agree.  Thanks.   Physician Documentation  Your signature is required to indicate approval of the treatment plan as stated above. By signing this report, you are approving the plan of care. Please sign and either send electronically or print and fax the signed copy to the number below. If you approve with modifications, please indicate those in the space provided. Physician Signature: Elsie Stain Date: 06/04/21 Time:1:19 PM

## 2021-06-07 ENCOUNTER — Other Ambulatory Visit: Payer: Self-pay

## 2021-06-07 ENCOUNTER — Ambulatory Visit: Payer: Medicare PPO

## 2021-06-07 ENCOUNTER — Ambulatory Visit: Payer: Medicare PPO | Admitting: Physical Therapy

## 2021-06-07 ENCOUNTER — Encounter: Payer: Medicare PPO | Admitting: Speech Pathology

## 2021-06-07 DIAGNOSIS — R2681 Unsteadiness on feet: Secondary | ICD-10-CM

## 2021-06-07 DIAGNOSIS — G20 Parkinson's disease: Secondary | ICD-10-CM | POA: Diagnosis not present

## 2021-06-07 DIAGNOSIS — R262 Difficulty in walking, not elsewhere classified: Secondary | ICD-10-CM | POA: Diagnosis not present

## 2021-06-07 DIAGNOSIS — M6281 Muscle weakness (generalized): Secondary | ICD-10-CM | POA: Diagnosis not present

## 2021-06-07 DIAGNOSIS — R296 Repeated falls: Secondary | ICD-10-CM | POA: Diagnosis not present

## 2021-06-07 DIAGNOSIS — R269 Unspecified abnormalities of gait and mobility: Secondary | ICD-10-CM

## 2021-06-07 DIAGNOSIS — R278 Other lack of coordination: Secondary | ICD-10-CM | POA: Diagnosis not present

## 2021-06-07 NOTE — Therapy (Signed)
[place] [facility] SERVICES [address] [place], [place], [address] Phone: [phone]   Fax:  [phone]  Physical Therapy Treatment  Patient Details  Name: [name] MRN: [id number] Date of Birth: [birth year] Referring Provider (PT): Elsie Stain, MD   Encounter Date: 06/07/2021   PT End of Session - 06/07/21 1049     Visit Number 42    Number of Visits 46    Date for PT Re-Evaluation 08/23/21    Authorization Type [employer] PPO    Authorization Time Period recert for 7/16-96/78    Progress Note Due on Visit 56    PT Start Time 1040    PT Stop Time 1120    PT Time Calculation (min) 40 min    Equipment Utilized During Treatment Gait belt    Activity Tolerance Patient tolerated treatment well    Behavior During Therapy [facility] for tasks assessed/performed             Past Medical History:  Diagnosis Date   Arthritis    Bradycardia    Cancer ([place]) 2013   skin cancer   Depression    ptsd   Dysrhythmia    chronic slow heart rate   GERD (gastroesophageal reflux disease)    Headache(784.0)    tension headaches non recent   History of chicken pox    History of kidney stones    passed   Hypertension    treated with HCTZ   Pacemaker    Parkinson's disease ([place])    dx'ed 15 years ago   PTSD (post-traumatic stress disorder)    Shortness of breath dyspnea    Sleep apnea    doesn't use C-pap   Varicose veins     Past Surgical History:  Procedure Laterality Date   CHOLECYSTECTOMY N/A 10/22/2014   Procedure: LAPAROSCOPIC CHOLECYSTECTOMY WITH INTRAOPERATIVE CHOLANGIOGRAM;  Surgeon: Dia Crawford III, MD;  Location: [facility] ORS;  Service: General;  Laterality: N/A;   cyst removed      from lip as a child   INTRAMEDULLARY (IM) NAIL INTERTROCHANTERIC N/A 02/18/2019   Procedure: INTRAMEDULLARY (IM) NAIL INTERTROCHANTRIC, RIGHT,;  Surgeon: Thornton Park, MD;  Location: [facility] ORS;  Service: Orthopedics;  Laterality:  N/A;   LUMBAR LAMINECTOMY/DECOMPRESSION MICRODISCECTOMY Bilateral 12/14/2012   Procedure: Bilateral lumbar three-four, four-five decompressive laminotomy/foraminotomy;  Surgeon: Charlie Pitter, MD;  Location: [place] [facility] ORS;  Service: Neurosurgery;  Laterality: Bilateral;   PULSE GENERATOR IMPLANT Bilateral 12/13/2013   Procedure: Bilateral implantable pulse generator placement;  Surgeon: Erline Levine, MD;  Location: [place] [facility]UR[facility] ORS;  Service: Neurosurgery;  Laterality: Bilateral;  Bilateral implantable pulse generator placement   skin cancer removed     from ears,   12 lft arm  rt leg 15   SUBTHALAMIC STIMULATOR BATTERY REPLACEMENT Bilateral 07/14/2017   Procedure: BILATERAL IMPLANTED PULSE GENERATOR CHANGE FOR DEEP BRAIN STIMULATOR;  Surgeon: Erline Levine, MD;  Location: [place];  Service: Neurosurgery;  Laterality: Bilateral;   SUBTHALAMIC STIMULATOR INSERTION Bilateral 12/06/2013   Procedure: SUBTHALAMIC STIMULATOR INSERTION;  Surgeon: Erline Levine, MD;  Location: [place] [facility]UR[facility] ORS;  Service: Neurosurgery;  Laterality: Bilateral;  Bilateral deep brain stimulator placement    There were no vitals filed for this visit.   Subjective Assessment - 06/07/21 1046     Subjective Pt reports having a fall over the weekend. He his his eye, has some blurry visiion but his wife/ caregiver reports this is something that has been present for some time.  Patient is accompained by: Family member    Pertinent History [name] is a 65yoM who comes to [facility] neuro for evaluation of conitnued difficulty of imbalance, unsteadiness on feet, and generally limited tolerance to mobility in the setting of chronic parkinsonism. SInce DC in February, pt has had PPM placement due to bradycardia issues. Pt also started using an UpWalker device 2 week prior to this evaluation, goal was to help with gradual melting of postural extension that occured with sustained upright actiivty. Pt has continued to practice supervised AMB with  caregivers, short distances out of house and in community. Pt continues to experience frequent falls on a weekly basis, at a rate that both he and wife report to be increasing in frequency. PMH: HTN, bradycardia s/p PPM, PD, DBS, COVID-19 infection, chronic low back pain s/p L4/5 surgery.    Limitations Walking;Standing;Sitting;House hold activities;Lifting    How long can you sit comfortably? Not limited    How long can you stand comfortably? 5 minutes with support    How long can you walk comfortably? ~5-6 minutes UpWalker Walker    Patient Stated Goals Reduce falls, improve AMB tolerance    Currently in Pain? Yes    Pain Score 5     Pain Location Back    Pain Onset More than a month ago    Pain Onset More than a month ago            INTERVENTIONS   Exercise/Activity Sets/Reps/Time/ Resistance Assistance Charge type Comments- Unless otherwise stated, CGA was provided and gait belt donned in order to ensure pt safety               Stair taps to inch step with TB in between feet for external cue to prevent NBOS  3 x 10 x ea LE CGA Gait training  Cues for large amplitude movement, " buzzer" when pt hit green TB for knowledge of performance throughout.   SLS progression- 1 LE on airex, contral on 6 in step plus airex 2 x 30 sec ea   Therex Cues for maintaining knee extension and posture, particularly with R LE posterior/as primary stance LE. Difficulty maintaining TKE on the R > L   LAQ 2 x 10 x 5# on ea LE   There ex To improve knee extension strength   STS on airex pad  2 x 10  CGA, hands on knees intermittently  There ACT 1 LOB posterior and corrected via PT to prevent collapse into chair  To improve STS ability on various surfaces and at decreased seat depth  Squat with 1 LE on airex  1 x 10 ea LE on airex  CGA  There ex   Walking in clinic without weights off up walker  X several minutes  CGA  Gait training Cues for turning on command, cues for proper BOS.  Cue for leaning in to  left side with left turns to prevent walker tipping and left front wheel losing ground contact.                     Treatment Provided this session   Pt educated throughout session about proper posture and technique with exercises. Improved exercise technique, movement at target joints, use of target muscles after min to mod verbal, visual, tactile cues.  Note: Portions of this document were prepared using Dragon voice recognition software and although reviewed may contain unintentional dictation errors in syntax, grammar, or spelling.  PT Short Term Goals - 05/31/21 1134       PT SHORT TERM GOAL #1   Title Pt to demonstrate improved sustained AMB tolerance >877ft.    Baseline At evlauation: 672ft tolerance (not to failure); 9/26: 1384 ft    Time 4    Period Weeks    Status Achieved    Target Date 01/05/21      PT SHORT TERM GOAL #2   Title Pt will improve BERG balance score to greater than 45 to indicate decrease risk of falls.    Baseline 2/13:44    Time 4    Period Weeks    Status On-going    Target Date 06/28/21      PT SHORT TERM GOAL #3   Title Pt to demonstrate 10MWT<13sec to show self-selected gait speed appropriate for AMB outside of house and to improve energy efficiency.    Baseline UpWalker Eval: 17.53sec; 9/26: 0.74 m/s (13.5)11/21: 13.68 (.73 m/s) 12.64 on 12/28 9.88 pn2/13    Time 4    Period Weeks    Status Achieved    Target Date 02/08/21               PT Long Term Goals - 05/31/21 1136       PT LONG TERM GOAL #1   Title Pt will improve BERG balance score to greater than 47 to indicate decreaed risk of falls.    Baseline 41 on 12/28 44 on 2/13    Time 8    Period Weeks    Status On-going    Target Date 07/26/21      PT LONG TERM GOAL #2   Title Pt to tolerate 1017ft sustained AMB with UpWalker without rest interval to improve safety and independence in limited community AMB.     Baseline At eval; tired after 664ft; 9/26: 1384 ft    Time 8    Period Weeks    Status Achieved    Target Date 05/03/21      PT LONG TERM GOAL #3   Title Pt to demonstrate improved FOTO score by 5 points compared to initital assessment.    Baseline 9/26: 49 (eval was 50) 911/21: 52 12/28 deferred as wife not present and pt not best historian 2/13: 43    Time 8    Period Weeks    Status On-going    Target Date 07/26/21      PT LONG TERM GOAL #4   Title Pt to show tolerance of AMB>1370ft with UpWalker to improve tolerance to accessing the community for IADL.    Baseline Eval: 676ft;  9/26: 1385    Time 12    Period Weeks    Status Achieved    Target Date --      PT LONG TERM GOAL #5   Title Patient will increase six minute walk test distance to >1000 for progression to community ambulator and improve gait ability    Baseline 9/26: new 11/21: 850 feet with up Walker 12/28: 925 feet with up walker, 980 feet with up walker    Time 8    Period Weeks    Status On-going    Target Date 07/26/21      PT LONG TERM GOAL #6   Title Patient will experience 1 fall or less per week for at least 3 weeks in order to improve his frequency of falls and prevent injury from falls    Baseline On average multiple falls per week.  Time 12    Period Weeks    Status New    Target Date 08/23/21                   Plan - 06/07/21 1050     Clinical Impression Statement Pt presents with good motivation for  Patient tolerated all interventions well with no increase in lower back pain. When ambulating pt has tendency for ifting left front wheel off of ground with left turns, with further investigation PT determined this may be influenced by tension on spots of the up walker and pt and caregive rencouraged to address this. Pt continues to show good progress with bakance and strength interventions at this time. Patient will continue to benefit from skilled physical therapy intervention in order to  improve his lower extremity strength, balance, ambulatory capacity, and safety with activities.    Personal Factors and Comorbidities Age;Past/Current Experience;Time since onset of injury/illness/exacerbation;Transportation    Comorbidities Chronic back pain, orthostatic hypotension, repeated falls, parkinsonism    Examination-Activity Limitations Bathing;Bed Mobility;Caring for Others;Bend;Dressing;Lift;Locomotion Level;Reach Overhead;Sit;Squat;Stairs;Stand;Toileting;Transfers;Carry    Examination-Participation Restrictions Cleaning;Community Activity;Interpersonal Relationship;Shop;Volunteer;Yard Work;Other;Driving    Stability/Clinical Decision Making Evolving/Moderate complexity    Rehab Potential Fair    PT Frequency 2x / week    PT Duration 12 weeks    PT Treatment/Interventions ADLs/Self Care Home Management;Aquatic Therapy;Cryotherapy;Electrical Stimulation;Iontophoresis 4mg /ml Dexamethasone;Moist Heat;Ultrasound;Contrast Bath;DME Instruction;Gait training;Stair training;Functional mobility training;Therapeutic activities;Therapeutic exercise;Balance training;Neuromuscular re-education;Patient/family education;Manual techniques;Wheelchair mobility training;Energy conservation;Passive range of motion;Traction;Orthotic Fit/Training;Dry needling;Vestibular;Visual/perceptual remediation/compensation;Taping;Canalith Repostioning;Cognitive remediation    PT Next Visit Plan Progress static and dynamic balance and continue with caregiver/patient ed with low back stretching    PT Home Exercise Plan None set up at evaluation, still working on previous HEP from 6 months prior (STS, daily walking); 8/29: issued STS with focus on large amplitude movement at support surface; 9/12: PT provides handout with sit to stand exercise and large amplitude seated trunk rotation exercise.  9/19: reinforced HEP, provided handout for seated postural exercise with UE abduction, and seated exercise with trunk twists and UE  reach, 10x for each, can be performed daily or minimum of 4x/week. No changes today.    Consulted and Agree with Plan of Care Patient;Family member/caregiver    Family Member Consulted Wife             Patient will benefit from skilled therapeutic intervention in order to improve the following deficits and impairments:  Abnormal gait, Decreased balance, Decreased coordination, Decreased mobility, Postural dysfunction, Decreased strength, Decreased safety awareness, Improper body mechanics, Impaired flexibility, Decreased activity tolerance, Decreased endurance, Decreased knowledge of precautions, Difficulty walking, Pain, Cardiopulmonary status limiting activity, Decreased range of motion, Impaired perceived functional ability, Decreased cognition, Dizziness  Visit Diagnosis: Abnormality of gait and mobility  Difficulty in walking, not elsewhere classified  Unsteadiness on feet     Problem List Patient Active Problem List   Diagnosis Date Noted   Sinus drainage 12/09/2020   Medicare annual wellness visit, subsequent 11/18/2020   Pacemaker    Dementia associated with Parkinson's disease ([place]) 06/17/2020   Vertigo 02/19/2020   Urine abnormality 02/19/2020   Dysphagia 02/23/2019   Fall at home 10/18/2017   REM behavioral disorder 11/02/2016   Radicular pain in right arm 10/09/2016   GERD (gastroesophageal reflux disease) 07/31/2016   Colon cancer screening 07/23/2015   Gout 02/18/2015   Depression 01/06/2015   Skin lesion 10/02/2014   Dupuytren's contracture 08/20/2014   Cough 07/21/2014   Lumbar stenosis with neurogenic claudication 06/13/2014  S/P deep brain stimulator placement 05/08/2014   Advance care planning 01/19/2014   Parkinson's disease ([place]) 12/13/2013   PTSD (post-traumatic stress disorder) 06/13/2013   Erectile dysfunction 06/13/2013   HLD (hyperlipidemia) 06/13/2013   Essential hypertension 06/03/2013   Bradycardia by electrocardiogram 06/03/2013    Obstructive sleep apnea 03/12/2013    Particia Lather, PT 06/07/2021, 12:37 PM  [place] [facility] SERVICES [address] [place], [place], [address] Phone: [phone]   Fax:  [phone]  Name: [name] MRN: [id number] Date of Birth: [birth year]

## 2021-06-07 NOTE — Therapy (Signed)
[place] MAIN [facility] SERVICES [address] [place], [place], [address] Phone: [phone]   Fax:  [phone]  Occupational Therapy Treatment  Patient Details  Name: [name] MRN: [id number] Date of Birth: Apr 01, 1951 Referring Provider (OT): Dr. Sharolyn Douglas (neurologist at [facility])   Encounter Date: 06/07/2021   OT End of Session - 06/07/21 1518     Visit Number 7    Number of Visits 24    Date for OT Re-Evaluation 08/03/21    OT Start Time 1147    OT Stop Time 1230    OT Time Calculation (min) 43 min    Equipment Utilized During Treatment rollator    Activity Tolerance Patient tolerated treatment well    Behavior During Therapy [facility] for tasks assessed/performed             Past Medical History:  Diagnosis Date   Arthritis    Bradycardia    Cancer ([place]) 2013   skin cancer   Depression    ptsd   Dysrhythmia    chronic slow heart rate   GERD (gastroesophageal reflux disease)    Headache(784.0)    tension headaches non recent   History of chicken pox    History of kidney stones    passed   Hypertension    treated with HCTZ   Pacemaker    Parkinson's disease ([place])    dx'ed 15 years ago   PTSD (post-traumatic stress disorder)    Shortness of breath dyspnea    Sleep apnea    doesn't use C-pap   Varicose veins     Past Surgical History:  Procedure Laterality Date   CHOLECYSTECTOMY N/A 10/22/2014   Procedure: LAPAROSCOPIC CHOLECYSTECTOMY WITH INTRAOPERATIVE CHOLANGIOGRAM;  Surgeon: Dia Crawford III, MD;  Location: [facility] ORS;  Service: General;  Laterality: N/A;   cyst removed      from lip as a child   INTRAMEDULLARY (IM) NAIL INTERTROCHANTERIC N/A 02/18/2019   Procedure: INTRAMEDULLARY (IM) NAIL INTERTROCHANTRIC, RIGHT,;  Surgeon: Thornton Park, MD;  Location: [facility] ORS;  Service: Orthopedics;  Laterality: N/A;   LUMBAR LAMINECTOMY/DECOMPRESSION MICRODISCECTOMY Bilateral 12/14/2012   Procedure: Bilateral lumbar  three-four, four-five decompressive laminotomy/foraminotomy;  Surgeon: Charlie Pitter, MD;  Location: [place] [facility];  Service: Neurosurgery;  Laterality: Bilateral;   PULSE GENERATOR IMPLANT Bilateral 12/13/2013   Procedure: Bilateral implantable pulse generator placement;  Surgeon: Erline Levine, MD;  Location: [place] [facility] OR[facility];  Service: Neurosurgery;  Laterality: Bilateral;  Bilateral implantable pulse generator placement   skin cancer removed     from ears,   12 lft arm  rt leg 15   SUBTHALAMIC STIMULATOR BATTERY REPLACEMENT Bilateral 07/14/2017   Procedure: BILATERAL IMPLANTED PULSE GENERATOR CHANGE FOR DEEP BRAIN STIMULATOR;  Surgeon: Erline Levine, MD;  Location: [place];  Service: Neurosurgery;  Laterality: Bilateral;   SUBTHALAMIC STIMULATOR INSERTION Bilateral 12/06/2013   Procedure: SUBTHALAMIC STIMULATOR INSERTION;  Surgeon: Erline Levine, MD;  Location: [place] [facility] OR[facility];  Service: Neurosurgery;  Laterality: Bilateral;  Bilateral deep brain stimulator placement    There were no vitals filed for this visit.   Subjective Assessment - 06/07/21 1516     Subjective  "He fell again Thurs." (per spouse)    Patient is accompanied by: Family member    Pertinent History Parkinson's Disease Diagnosed in 2000, frequent falls    Limitations UE/LE tremors, decreased balance, lack of coordination, intermittent diplopia (has prism glasses)    Patient Stated Goals Pt/spouse verbalize desire to improve [facility]  skills for holding playing cards, and improving shoulder flexibility and posture for better walker use.    Currently in Pain? Yes    Pain Score 5     Pain Location Back    Pain Orientation Lower    Pain Descriptors / Indicators Aching    Pain Type Chronic pain    Pain Onset More than a month ago    Pain Frequency Intermittent    Aggravating Factors  walking, transfers    Pain Relieving Factors rest, heat    Effect of Pain on Daily Activities difficulty with ADLs and walking    Pain Onset More than a month  ago            Occupational Therapy Treatment: Therapeutic Exercise: Pt participated in core strengthening exercises seated edge of mat, reaching contralaterally, ipsilaterally, and forward with both hands with return to midline after each rep.  Intermittent min vc for erect sitting posture with return to midline, and keeping feet flat on floor.  Challenged midline sitting with therapist providing moderate perturbations in all directions without pt swaying from midline.  In sitting, performed 1 lb dowel stretches for bilat shoulder flex/abd/ER, and in standing for shoulder extension behind back x10 each.  Min guard for stability in standing to complete shoulder ext with cane.  Mod vc for forward head during ER with cane to top of head, working to avoid chin tuck.    Response to Treatment: See Plan/clinical impression below.    OT Education - 06/07/21 1518     Education Details core strengthening exercises    Person(s) Educated Patient;Spouse    Methods Explanation;Verbal cues;Demonstration    Comprehension Verbalized understanding;Returned demonstration;Verbal cues required;Need further instruction              OT Short Term Goals - 05/13/21 0834       OT SHORT TERM GOAL #1   Title Pt/caregiver will be indep with HEP    Baseline Eval: not yet initiated    Time 6    Period Weeks    Status New    Target Date 06/22/21               OT Long Term Goals - 05/13/21 0836       OT LONG TERM GOAL #1   Title Pt will improve FOTO score to 40 or better to indicate increased functional improvement.    Baseline Eval: FOTO 34    Time 12    Period Weeks    Status New    Target Date 08/03/21      OT LONG TERM GOAL #2   Title Pt will improve R lateral pinch strength to be able to tolerate holding playing cards in R dominant hand for game of cards.    Baseline Eval: R lateral pinch 16 lbs; pt enjoys playing cards but is not currently playing d/t decreased strength/coordination     Time 12    Period Weeks    Status New    Target Date 08/03/21      OT LONG TERM GOAL #3   Title Pt will improve R dominant hand to mouth coordination to self feed with minimal spilling and utilize adapted utensils as needed.    Baseline Eval: Spouse reports eating is messy and pt struggles to keep utensil level.    Time 12    Period Weeks    Status New    Target Date 08/03/21      OT LONG TERM GOAL #  4   Title Pt will improve bilat shoulder flex/abd for improved overhead reach during self care.    Baseline Eval: R shoulder flex 110, L 120, R abd 125, L 165    Time 12    Period Weeks    Status New    Target Date 08/03/21      OT LONG TERM GOAL #5   Title Pt will improve [place] in bilat hands as noted by improvement in 9 hole peg test score by 5 or more seconds to enable participate in family board games.    Baseline Eval: R/L 9 hole peg test 41 secs (pt has not recently participated in board games)    Time 12    Period Weeks    Status New    Target Date 08/03/21      Long Term Additional Goals   Additional Long Term Goals Yes      OT LONG TERM GOAL #6   Title Pt will be able to initiate simple texts and reply to texts on phone with min vc.    Baseline Eval: Pt struggles with texting    Time 12    Period Weeks    Status New    Target Date 08/03/21      OT LONG TERM GOAL #7   Title Pt will improve posture for ADLs and functional mobility as demonstrated by ability to keep feet within back two legs of rollator 50% of the time with min vc.    Baseline Eval: pt with forward lean and feet are behind back 2 legs of walker during functional mobility.    Time 12    Status New    Target Date 08/03/21              Plan - 06/07/21 1530     Clinical Impression Statement Pt generally sore today after a fall last Thurs at home.  Spouse reports that pt got up without walker to move an exercise ball that was on the floor and fell backward.  OT reinforced importance of keeping  walker with him at all times to ensure at least unilateral support when reaching.  Spouse also reinforces this, as pt has memory deficits which make carry over of this recommendation inconsistent.  Pt tolerated core strengthening well today, only leaning mildly to the R toward end of session when pt started to fatigue, otherwise pt kept self in midline with only intermittent min vc to correct posture.  Pt will continue to benefit from skilled OT for ADL training, compensatory ADL techniques, and therapeutic exercise to increase bilat hand coordination, bilat shoulder flexibility, and core strength for better stability and posture during self care and functional mobility.    OT Occupational Profile and History Problem Focused Assessment - Including review of records relating to presenting problem    Occupational performance deficits (Please refer to evaluation for details): ADL's;IADL's;Leisure    Body Structure / Function / Physical Skills ADL;Coordination;GMC;UE functional use;Balance;Body mechanics;Decreased knowledge of use of DME;Flexibility;Dexterity;FMC;Strength;ROM    Cognitive Skills Attention;Memory    Rehab Potential Good    Clinical Decision Making Several treatment options, min-mod task modification necessary    Comorbidities Affecting Occupational Performance: May have comorbidities impacting occupational performance    Modification or Assistance to Complete Evaluation  No modification of tasks or assist necessary to complete eval    OT Frequency 2x / week    OT Duration 12 weeks    OT Treatment/Interventions Self-care/ADL training;Therapeutic exercise;DME and/or AE instruction;Functional  Mobility Training;Cognitive remediation/compensation;Balance training;Neuromuscular education;Manual Therapy;Moist Heat;Energy conservation;Passive range of motion;Therapeutic activities;Patient/family education    OT Home Exercise Plan not yet initiated at eval    Consulted and Agree with Plan of Care  Patient;Family member/caregiver    Family Member Consulted spouse, [name]             Patient will benefit from skilled therapeutic intervention in order to improve the following deficits and impairments:   Body Structure / Function / Physical Skills: ADL, Coordination, GMC, UE functional use, Balance, Body mechanics, Decreased knowledge of use of DME, Flexibility, Dexterity, FMC, Strength, ROM Cognitive Skills: Attention, Memory     Visit Diagnosis: Muscle weakness (generalized)  Parkinson's disease ([place])  Other lack of coordination    Problem List Patient Active Problem List   Diagnosis Date Noted   Sinus drainage 12/09/2020   Medicare annual wellness visit, subsequent 11/18/2020   Pacemaker    Dementia associated with Parkinson's disease ([place]) 06/17/2020   Vertigo 02/19/2020   Urine abnormality 02/19/2020   Dysphagia 02/23/2019   Fall at home 10/18/2017   REM behavioral disorder 11/02/2016   Radicular pain in right arm 10/09/2016   GERD (gastroesophageal reflux disease) 07/31/2016   Colon cancer screening 07/23/2015   Gout 02/18/2015   Depression 01/06/2015   Skin lesion 10/02/2014   Dupuytren's contracture 08/20/2014   Cough 07/21/2014   Lumbar stenosis with neurogenic claudication 06/13/2014   S/P deep brain stimulator placement 05/08/2014   Advance care planning 01/19/2014   Parkinson's disease ([place]) 12/13/2013   PTSD (post-traumatic stress disorder) 06/13/2013   Erectile dysfunction 06/13/2013   HLD (hyperlipidemia) 06/13/2013   Essential hypertension 06/03/2013   Bradycardia by electrocardiogram 06/03/2013   Obstructive sleep apnea 03/12/2013   George Speller, MS, OTR/L  [name], OT 06/07/2021, 3:31 PM  [place] MAIN [facility] SERVICES [address] [place], [place], [address] Phone: [phone]   Fax:  [phone]  Name: [name] MRN: [id number] Date of Birth: Apr 17, 1951

## 2021-06-09 ENCOUNTER — Ambulatory Visit: Payer: Medicare PPO

## 2021-06-09 ENCOUNTER — Ambulatory Visit: Payer: Medicare PPO | Admitting: Physical Therapy

## 2021-06-09 ENCOUNTER — Encounter: Payer: Medicare PPO | Admitting: Speech Pathology

## 2021-06-09 ENCOUNTER — Other Ambulatory Visit: Payer: Self-pay

## 2021-06-09 ENCOUNTER — Other Ambulatory Visit: Payer: Self-pay | Admitting: Family Medicine

## 2021-06-09 DIAGNOSIS — R262 Difficulty in walking, not elsewhere classified: Secondary | ICD-10-CM

## 2021-06-09 DIAGNOSIS — R278 Other lack of coordination: Secondary | ICD-10-CM | POA: Diagnosis not present

## 2021-06-09 DIAGNOSIS — M6281 Muscle weakness (generalized): Secondary | ICD-10-CM

## 2021-06-09 DIAGNOSIS — R269 Unspecified abnormalities of gait and mobility: Secondary | ICD-10-CM | POA: Diagnosis not present

## 2021-06-09 DIAGNOSIS — R2681 Unsteadiness on feet: Secondary | ICD-10-CM | POA: Diagnosis not present

## 2021-06-09 DIAGNOSIS — R296 Repeated falls: Secondary | ICD-10-CM

## 2021-06-09 DIAGNOSIS — G20 Parkinson's disease: Secondary | ICD-10-CM

## 2021-06-09 NOTE — Therapy (Signed)
[place] MAIN [facility] SERVICES [address] [place], [place], [address] Phone: [phone]   Fax:  [phone]  Physical Therapy Treatment  Patient Details  Name: [name] MRN: [id number] Date of Birth: September 27, 1950 Referring Provider (PT): Elsie Stain, MD   Encounter Date: 06/09/2021   PT End of Session - 06/09/21 1109     Visit Number 43    Number of Visits 34    Date for PT Re-Evaluation 08/23/21    Authorization Type [employer] Choice PPO    Authorization Time Period recert for 8/36-62/94    Progress Note Due on Visit 9    PT Start Time 1102    PT Stop Time 1145    PT Time Calculation (min) 43 min    Equipment Utilized During Treatment Gait belt    Activity Tolerance Patient tolerated treatment well    Behavior During Therapy [facility] for tasks assessed/performed             Past Medical History:  Diagnosis Date   Arthritis    Bradycardia    Cancer ([place]) 2013   skin cancer   Depression    ptsd   Dysrhythmia    chronic slow heart rate   GERD (gastroesophageal reflux disease)    Headache(784.0)    tension headaches non recent   History of chicken pox    History of kidney stones    passed   Hypertension    treated with HCTZ   Pacemaker    Parkinson's disease ([place])    dx'ed 15 years ago   PTSD (post-traumatic stress disorder)    Shortness of breath dyspnea    Sleep apnea    doesn't use C-pap   Varicose veins     Past Surgical History:  Procedure Laterality Date   CHOLECYSTECTOMY N/A 10/22/2014   Procedure: LAPAROSCOPIC CHOLECYSTECTOMY WITH INTRAOPERATIVE CHOLANGIOGRAM;  Surgeon: Dia Crawford III, MD;  Location: [facility] ORS;  Service: General;  Laterality: N/A;   cyst removed      from lip as a child   INTRAMEDULLARY (IM) NAIL INTERTROCHANTERIC N/A 02/18/2019   Procedure: INTRAMEDULLARY (IM) NAIL INTERTROCHANTRIC, RIGHT,;  Surgeon: Thornton Park, MD;  Location: [facility] ORS;  Service: Orthopedics;  Laterality:  N/A;   LUMBAR LAMINECTOMY/DECOMPRESSION MICRODISCECTOMY Bilateral 12/14/2012   Procedure: Bilateral lumbar three-four, four-five decompressive laminotomy/foraminotomy;  Surgeon: Charlie Pitter, MD;  Location: [place] [facility] OR[facility];  Service: Neurosurgery;  Laterality: Bilateral;   PULSE GENERATOR IMPLANT Bilateral 12/13/2013   Procedure: Bilateral implantable pulse generator placement;  Surgeon: Erline Levine, MD;  Location: [place] [facility] ORS;  Service: Neurosurgery;  Laterality: Bilateral;  Bilateral implantable pulse generator placement   skin cancer removed     from ears,   12 lft arm  rt leg 15   SUBTHALAMIC STIMULATOR BATTERY REPLACEMENT Bilateral 07/14/2017   Procedure: BILATERAL IMPLANTED PULSE GENERATOR CHANGE FOR DEEP BRAIN STIMULATOR;  Surgeon: Erline Levine, MD;  Location: [place];  Service: Neurosurgery;  Laterality: Bilateral;   SUBTHALAMIC STIMULATOR INSERTION Bilateral 12/06/2013   Procedure: SUBTHALAMIC STIMULATOR INSERTION;  Surgeon: Erline Levine, MD;  Location: [place] [facility] ORS;  Service: Neurosurgery;  Laterality: Bilateral;  Bilateral deep brain stimulator placement    There were no vitals filed for this visit.   Subjective Assessment - 06/09/21 1105     Subjective Pt reports some shoulder an dlow back pain a tthis time. Reports no falls or LOB since previous session. No other significant changes noted.    Patient is accompained by:  Family member    Pertinent History [name] is a 57yoM who comes to [facility] neuro for evaluation of conitnued difficulty of imbalance, unsteadiness on feet, and generally limited tolerance to mobility in the setting of chronic parkinsonism. SInce DC in February, pt has had PPM placement due to bradycardia issues. Pt also started using an UpWalker device 2 week prior to this evaluation, goal was to help with gradual melting of postural extension that occured with sustained upright actiivty. Pt has continued to practice supervised AMB with caregivers, short distances out  of house and in community. Pt continues to experience frequent falls on a weekly basis, at a rate that both he and wife report to be increasing in frequency. PMH: HTN, bradycardia s/p PPM, PD, DBS, COVID-19 infection, chronic low back pain s/p L4/5 surgery.    Limitations Walking;Standing;Sitting;House hold activities;Lifting    How long can you sit comfortably? Not limited    How long can you stand comfortably? 5 minutes with support    How long can you walk comfortably? ~5-6 minutes UpWalker Walker    Patient Stated Goals Reduce falls, improve AMB tolerance    Pain Onset More than a month ago    Pain Onset More than a month ago               Exercise/Activity Sets/Reps/Time/ Resistance Assistance Charge type Comments- Unless otherwise stated, CGA was provided and gait belt donned in order to ensure pt safety   Standing PWR! Step  2 x 10 on ea LE   Therex  Cues for proper sequencing as well as for amplitude of movement.   Hip abduction in standing  2 x 10 ea LE with 5# AW donned   Therex  External cues for aiming of LE to ensure proper muscle activation, prior to cues pt performing hip flexion combines with hip abduction movement pattern.   Stair taps to inch step with TB in between feet for external cue to prevent NBOS  3 x 10 x ea LE CGA Gait training  Cues for large amplitude movement, " buzzer" when pt hit green TB for knowledge of performance throughout.   SLS progression- 1 LE on airex, contra on 6 in step plus airex 2 x 30 sec ea   Therex Cues for maintaining knee extension and posture, particularly with R LE posterior/as primary stance LE. Difficulty maintaining TKE on the R > L   LAQ 2 x 10 x 5# on ea LE   There ex To improve knee extension strength  External cues for full knee extension ROM.   STS on airex pad  2 x 10  CGA, hands on knees intermittently  There ACT 1 LOB posterior and corrected via PT to prevent collapse into chair  To improve STS ability on various surfaces and  at decreased seat depth  Squat with 1 LE on airex  1 x 10 ea LE on airex  CGA  There ex         PRW! Step with UE assist  2 x 10  B UE, CGA  Therex  Cues for sequencing as well as amplitude of movement.               Treatment Provided this session   Pt educated throughout session about proper posture and technique with exercises. Improved exercise technique, movement at target joints, use of target muscles after min to mod verbal, visual, tactile cues.  Note: Portions of this document were prepared using  [employer] training and education officer and although reviewed may contain unintentional dictation errors in syntax, grammar, or spelling.                              PT Education - 06/09/21 1109     Education provided Yes    Education Details Exercisse form and technique    Person(s) Educated Patient    Methods Explanation    Comprehension Verbalized understanding              PT Short Term Goals - 05/31/21 1134       PT SHORT TERM GOAL #1   Title Pt to demonstrate improved sustained AMB tolerance >871ft.    Baseline At evlauation: 64ft tolerance (not to failure); 9/26: 1384 ft    Time 4    Period Weeks    Status Achieved    Target Date 01/05/21      PT SHORT TERM GOAL #2   Title Pt will improve BERG balance score to greater than 45 to indicate decrease risk of falls.    Baseline 2/13:44    Time 4    Period Weeks    Status On-going    Target Date 06/28/21      PT SHORT TERM GOAL #3   Title Pt to demonstrate 10MWT<13sec to show self-selected gait speed appropriate for AMB outside of house and to improve energy efficiency.    Baseline UpWalker Eval: 17.53sec; 9/26: 0.74 m/s (13.5)11/21: 13.68 (.73 m/s) 12.64 on 12/28 9.88 pn2/13    Time 4    Period Weeks    Status Achieved    Target Date 02/08/21               PT Long Term Goals - 05/31/21 1136       PT LONG TERM GOAL #1   Title Pt will improve BERG balance score to greater than 47  to indicate decreaed risk of falls.    Baseline 41 on 12/28 44 on 2/13    Time 8    Period Weeks    Status On-going    Target Date 07/26/21      PT LONG TERM GOAL #2   Title Pt to tolerate 1068ft sustained AMB with UpWalker without rest interval to improve safety and independence in limited community AMB.    Baseline At eval; tired after 680ft; 9/26: 1384 ft    Time 8    Period Weeks    Status Achieved    Target Date 05/03/21      PT LONG TERM GOAL #3   Title Pt to demonstrate improved FOTO score by 5 points compared to initital assessment.    Baseline 9/26: 49 (eval was 50) 911/21: 52 12/28 deferred as wife not present and pt not best historian 2/13: 43    Time 8    Period Weeks    Status On-going    Target Date 07/26/21      PT LONG TERM GOAL #4   Title Pt to show tolerance of AMB>1310ft with UpWalker to improve tolerance to accessing the community for IADL.    Baseline Eval: 675ft;  9/26: 1385    Time 12    Period Weeks    Status Achieved    Target Date --      PT LONG TERM GOAL #5   Title Patient will increase six minute walk test distance to >1000 for progression to community ambulator and improve gait  ability    Baseline 9/26: new 11/21: 850 feet with up Walker 12/28: 925 feet with up walker, 980 feet with up walker    Time 8    Period Weeks    Status On-going    Target Date 07/26/21      PT LONG TERM GOAL #6   Title Patient will experience 1 fall or less per week for at least 3 weeks in order to improve his frequency of falls and prevent injury from falls    Baseline On average multiple falls per week.    Time 12    Period Weeks    Status New    Target Date 08/23/21                   Plan - 06/09/21 1110     Clinical Impression Statement Pt presents with good motivation for Patient tolerated all interventions well with no increase in lower back pain.  Patient progressed with several lower extremity strengthening and coordination activities this  session.  Patient also continues to show progress with lower extremity stability when ambulating without assistive device for short distances in the clinic, however, patient does require contact-guard assist for safety during these transitions.  Patient will continue to benefit from skilled physical therapy intervention in order to improve his lower extremity strength, balance, reduce falls, improve his safety with everyday activities.    Personal Factors and Comorbidities Age;Past/Current Experience;Time since onset of injury/illness/exacerbation;Transportation    Comorbidities Chronic back pain, orthostatic hypotension, repeated falls, parkinsonism    Examination-Activity Limitations Bathing;Bed Mobility;Caring for Others;Bend;Dressing;Lift;Locomotion Level;Reach Overhead;Sit;Squat;Stairs;Stand;Toileting;Transfers;Carry    Examination-Participation Restrictions Cleaning;Community Activity;Interpersonal Relationship;Shop;Volunteer;Yard Work;Other;Driving    Stability/Clinical Decision Making Evolving/Moderate complexity    Rehab Potential Fair    PT Frequency 2x / week    PT Duration 12 weeks    PT Treatment/Interventions ADLs/Self Care Home Management;Aquatic Therapy;Cryotherapy;Electrical Stimulation;Iontophoresis 4mg /ml Dexamethasone;Moist Heat;Ultrasound;Contrast Bath;DME Instruction;Gait training;Stair training;Functional mobility training;Therapeutic activities;Therapeutic exercise;Balance training;Neuromuscular re-education;Patient/family education;Manual techniques;Wheelchair mobility training;Energy conservation;Passive range of motion;Traction;Orthotic Fit/Training;Dry needling;Vestibular;Visual/perceptual remediation/compensation;Taping;Canalith Repostioning;Cognitive remediation    PT Next Visit Plan Progress static and dynamic balance and continue with caregiver/patient ed with low back stretching    PT Home Exercise Plan None set up at evaluation, still working on previous HEP from 6 months  prior (STS, daily walking); 8/29: issued STS with focus on large amplitude movement at support surface; 9/12: PT provides handout with sit to stand exercise and large amplitude seated trunk rotation exercise.  9/19: reinforced HEP, provided handout for seated postural exercise with UE abduction, and seated exercise with trunk twists and UE reach, 10x for each, can be performed daily or minimum of 4x/week. No changes today.    Consulted and Agree with Plan of Care Patient;Family member/caregiver    Family Member Consulted Wife             Patient will benefit from skilled therapeutic intervention in order to improve the following deficits and impairments:  Abnormal gait, Decreased balance, Decreased coordination, Decreased mobility, Postural dysfunction, Decreased strength, Decreased safety awareness, Improper body mechanics, Impaired flexibility, Decreased activity tolerance, Decreased endurance, Decreased knowledge of precautions, Difficulty walking, Pain, Cardiopulmonary status limiting activity, Decreased range of motion, Impaired perceived functional ability, Decreased cognition, Dizziness  Visit Diagnosis: Abnormality of gait and mobility  Difficulty in walking, not elsewhere classified  Repeated falls     Problem List Patient Active Problem List   Diagnosis Date Noted   Sinus drainage 12/09/2020   Medicare annual wellness visit, subsequent 11/18/2020  Pacemaker    Dementia associated with Parkinson's disease ([place]) 06/17/2020   Vertigo 02/19/2020   Urine abnormality 02/19/2020   Dysphagia 02/23/2019   Fall at home 10/18/2017   REM behavioral disorder 11/02/2016   Radicular pain in right arm 10/09/2016   GERD (gastroesophageal reflux disease) 07/31/2016   Colon cancer screening 07/23/2015   Gout 02/18/2015   Depression 01/06/2015   Skin lesion 10/02/2014   Dupuytren's contracture 08/20/2014   Cough 07/21/2014   Lumbar stenosis with neurogenic claudication 06/13/2014    S/P deep brain stimulator placement 05/08/2014   Advance care planning 01/19/2014   Parkinson's disease ([place]) 12/13/2013   PTSD (post-traumatic stress disorder) 06/13/2013   Erectile dysfunction 06/13/2013   HLD (hyperlipidemia) 06/13/2013   Essential hypertension 06/03/2013   Bradycardia by electrocardiogram 06/03/2013   Obstructive sleep apnea 03/12/2013    Particia Lather, PT 06/09/2021, 2:39 PM  [place] MAIN [facility] SERVICES [address] [place], [place], [address] Phone: [phone]   Fax:  [phone]  Name: [name] MRN: [id number] Date of Birth: 23-Feb-1951

## 2021-06-09 NOTE — Therapy (Signed)
[place] MAIN [facility] SERVICES [address] [place], [place], [address] Phone: [phone]   Fax:  [phone]  Occupational Therapy Treatment  Patient Details  Name: [name] MRN: [id number] Date of Birth: [birth year] Referring Provider (OT): Dr. Sharolyn Douglas (neurologist at [facility])   Encounter Date: 06/09/2021   OT End of Session - 06/09/21 1948     Visit Number 8    Number of Visits 24    Date for OT Re-Evaluation 08/03/21    OT Start Time 7124    OT Stop Time 1230    OT Time Calculation (min) 45 min    Equipment Utilized During Treatment rollator    Activity Tolerance Patient tolerated treatment well    Behavior During Therapy [facility] for tasks assessed/performed             Past Medical History:  Diagnosis Date   Arthritis    Bradycardia    Cancer ([place]) 2013   skin cancer   Depression    ptsd   Dysrhythmia    chronic slow heart rate   GERD (gastroesophageal reflux disease)    Headache(784.0)    tension headaches non recent   History of chicken pox    History of kidney stones    passed   Hypertension    treated with HCTZ   Pacemaker    Parkinson's disease ([place])    dx'ed 15 years ago   PTSD (post-traumatic stress disorder)    Shortness of breath dyspnea    Sleep apnea    doesn't use C-pap   Varicose veins     Past Surgical History:  Procedure Laterality Date   CHOLECYSTECTOMY N/A 10/22/2014   Procedure: LAPAROSCOPIC CHOLECYSTECTOMY WITH INTRAOPERATIVE CHOLANGIOGRAM;  Surgeon: Dia Crawford III, MD;  Location: [facility] ORS;  Service: General;  Laterality: N/A;   cyst removed      from lip as a child   INTRAMEDULLARY (IM) NAIL INTERTROCHANTERIC N/A 02/18/2019   Procedure: INTRAMEDULLARY (IM) NAIL INTERTROCHANTRIC, RIGHT,;  Surgeon: Thornton Park, MD;  Location: [facility] ORS;  Service: Orthopedics;  Laterality: N/A;   LUMBAR LAMINECTOMY/DECOMPRESSION MICRODISCECTOMY Bilateral 12/14/2012   Procedure: Bilateral lumbar  three-four, four-five decompressive laminotomy/foraminotomy;  Surgeon: Charlie Pitter, MD;  Location: [place] [facility];  Service: Neurosurgery;  Laterality: Bilateral;   PULSE GENERATOR IMPLANT Bilateral 12/13/2013   Procedure: Bilateral implantable pulse generator placement;  Surgeon: Erline Levine, MD;  Location: [place] [facility];  Service: Neurosurgery;  Laterality: Bilateral;  Bilateral implantable pulse generator placement   skin cancer removed     from ears,   12 lft arm  rt leg 15   SUBTHALAMIC STIMULATOR BATTERY REPLACEMENT Bilateral 07/14/2017   Procedure: BILATERAL IMPLANTED PULSE GENERATOR CHANGE FOR DEEP BRAIN STIMULATOR;  Surgeon: Erline Levine, MD;  Location: [place];  Service: Neurosurgery;  Laterality: Bilateral;   SUBTHALAMIC STIMULATOR INSERTION Bilateral 12/06/2013   Procedure: SUBTHALAMIC STIMULATOR INSERTION;  Surgeon: Erline Levine, MD;  Location: [place] [facility] OR[facility];  Service: Neurosurgery;  Laterality: Bilateral;  Bilateral deep brain stimulator placement    There were no vitals filed for this visit.   Subjective Assessment - 06/09/21 1946     Subjective  "I'm just generally sore."    Patient is accompanied by: Family member    Pertinent History Parkinson's Disease Diagnosed in 2000, frequent falls    Limitations UE/LE tremors, decreased balance, lack of coordination, intermittent diplopia (has prism glasses)    Patient Stated Goals Pt/spouse verbalize desire to improve [facility] skills for  holding playing cards, and improving shoulder flexibility and posture for better walker use.    Currently in Pain? Yes    Pain Score 5     Pain Location Back    Pain Orientation Lower    Pain Descriptors / Indicators Aching    Pain Type Chronic pain    Pain Radiating Towards generally sore from fall last week    Pain Onset More than a month ago    Pain Frequency Intermittent    Aggravating Factors  walking, transfers    Pain Relieving Factors rest, heat    Effect of Pain on Daily Activities difficulty  with ADLs and walking    Multiple Pain Sites No    Pain Onset More than a month ago             Occupational Therapy Treatment: Therapeutic Exercise: Pt participated in core strengthening exercises seated edge of mat, reaching contralaterally with L and R,  posterior/anterior shifting from midline, and trunk rotation x10 reps each.  Challenged midline sitting with therapist providing moderate perturbations in all directions without pt swaying from midline.  In sitting, performed 1 lb dowel stretches for bilat shoulder flex/abd/ER x10 reps with cues throughout for erect sitting posture; vc to retract shoulders and tighten abdomen between exercises.  Pt sat with slightly rounded back but corrected well with these cues.  Carried over erect sitting posture to standing against wall, OT providing shoulder press against wall and cues to squeeze abdomen, quads and gluts for erect standing.  OT elevated forearm supports slightly on walker to limit pt slouching over into arm rests during functional mobility.  Provided vc to avoid looking down at ground, but rather forward head.  Encouraged spouse to provide vc to have pt look for object in the distance when walking in community setting to help with carryover.   Self Care: Practiced sending and responding to short text messages on phone.  Therapist assisted to move text icon back to bottom L corner for more efficient access as it had accidentally been moved to a less visible location on home screen.  Pt was indep to locate correct contact and switch contacts to begin a different conversation with another contact.  Extra time needed but speed is improving and pt is messaging with fewer verbal cues from therapist.   Response to Treatment: See Plan/clinical impression below.    OT Education - 06/09/21 1947     Education Details core strengthening exercises, posture with sitting and functional mobility    Person(s) Educated Patient;Spouse    Methods  Explanation;Verbal cues;Demonstration    Comprehension Verbalized understanding;Returned demonstration;Verbal cues required;Need further instruction              OT Short Term Goals - 05/13/21 0834       OT SHORT TERM GOAL #1   Title Pt/caregiver will be indep with HEP    Baseline Eval: not yet initiated    Time 6    Period Weeks    Status New    Target Date 06/22/21               OT Long Term Goals - 05/13/21 0836       OT LONG TERM GOAL #1   Title Pt will improve FOTO score to 40 or better to indicate increased functional improvement.    Baseline Eval: FOTO 34    Time 12    Period Weeks    Status New    Target Date 08/03/21  OT LONG TERM GOAL #2   Title Pt will improve R lateral pinch strength to be able to tolerate holding playing cards in R dominant hand for game of cards.    Baseline Eval: R lateral pinch 16 lbs; pt enjoys playing cards but is not currently playing d/t decreased strength/coordination    Time 12    Period Weeks    Status New    Target Date 08/03/21      OT LONG TERM GOAL #3   Title Pt will improve R dominant hand to mouth coordination to self feed with minimal spilling and utilize adapted utensils as needed.    Baseline Eval: Spouse reports eating is messy and pt struggles to keep utensil level.    Time 12    Period Weeks    Status New    Target Date 08/03/21      OT LONG TERM GOAL #4   Title Pt will improve bilat shoulder flex/abd for improved overhead reach during self care.    Baseline Eval: R shoulder flex 110, L 120, R abd 125, L 165    Time 12    Period Weeks    Status New    Target Date 08/03/21      OT LONG TERM GOAL #5   Title Pt will improve [place] in bilat hands as noted by improvement in 9 hole peg test score by 5 or more seconds to enable participate in family board games.    Baseline Eval: R/L 9 hole peg test 41 secs (pt has not recently participated in board games)    Time 12    Period Weeks    Status New     Target Date 08/03/21      Long Term Additional Goals   Additional Long Term Goals Yes      OT LONG TERM GOAL #6   Title Pt will be able to initiate simple texts and reply to texts on phone with min vc.    Baseline Eval: Pt struggles with texting    Time 12    Period Weeks    Status New    Target Date 08/03/21      OT LONG TERM GOAL #7   Title Pt will improve posture for ADLs and functional mobility as demonstrated by ability to keep feet within back two legs of rollator 50% of the time with min vc.    Baseline Eval: pt with forward lean and feet are behind back 2 legs of walker during functional mobility.    Time 12    Status New    Target Date 08/03/21              Plan - 06/09/21 2008     Clinical Impression Statement Pt was indep to locate correct contact and switch contacts to begin a different conversation with another contact when texting this day.  Extra time needed but speed is improving and pt is messaging with fewer verbal cues from therapist.  Minimal R lateral leaning noted this day with seated mat activities, though slightly rounded back but able to correct with intermittent cueing.  Pt will continue to benefit from skilled OT for ADL training, compensatory ADL techniques, and therapeutic exercise to increase bilat hand coordination, bilat shoulder flexibility, and core strength for better stability and posture during self care and functional mobility.    OT Occupational Profile and History Problem Focused Assessment - Including review of records relating to presenting problem    Occupational performance  deficits (Please refer to evaluation for details): ADL's;IADL's;Leisure    Body Structure / Function / Physical Skills ADL;Coordination;GMC;UE functional use;Balance;Body mechanics;Decreased knowledge of use of DME;Flexibility;Dexterity;FMC;Strength;ROM    Cognitive Skills Attention;Memory    Rehab Potential Good    Clinical Decision Making Several treatment options,  min-mod task modification necessary    Comorbidities Affecting Occupational Performance: May have comorbidities impacting occupational performance    Modification or Assistance to Complete Evaluation  No modification of tasks or assist necessary to complete eval    OT Frequency 2x / week    OT Duration 12 weeks    OT Treatment/Interventions Self-care/ADL training;Therapeutic exercise;DME and/or AE instruction;Functional Mobility Training;Cognitive remediation/compensation;Balance training;Neuromuscular education;Manual Therapy;Moist Heat;Energy conservation;Passive range of motion;Therapeutic activities;Patient/family education    OT Home Exercise Plan not yet initiated at eval    Consulted and Agree with Plan of Care Patient;Family member/caregiver    Family Member Consulted spouse, [name]             Patient will benefit from skilled therapeutic intervention in order to improve the following deficits and impairments:   Body Structure / Function / Physical Skills: ADL, Coordination, GMC, UE functional use, Balance, Body mechanics, Decreased knowledge of use of DME, Flexibility, Dexterity, FMC, Strength, ROM Cognitive Skills: Attention, Memory     Visit Diagnosis: Muscle weakness (generalized)  Other lack of coordination  Parkinson's disease ([place])    Problem List Patient Active Problem List   Diagnosis Date Noted   Sinus drainage 12/09/2020   Medicare annual wellness visit, subsequent 11/18/2020   Pacemaker    Dementia associated with Parkinson's disease ([place]) 06/17/2020   Vertigo 02/19/2020   Urine abnormality 02/19/2020   Dysphagia 02/23/2019   Fall at home 10/18/2017   REM behavioral disorder 11/02/2016   Radicular pain in right arm 10/09/2016   GERD (gastroesophageal reflux disease) 07/31/2016   Colon cancer screening 07/23/2015   Gout 02/18/2015   Depression 01/06/2015   Skin lesion 10/02/2014   Dupuytren's contracture 08/20/2014   Cough 07/21/2014   Lumbar  stenosis with neurogenic claudication 06/13/2014   S/P deep brain stimulator placement 05/08/2014   Advance care planning 01/19/2014   Parkinson's disease ([place]) 12/13/2013   PTSD (post-traumatic stress disorder) 06/13/2013   Erectile dysfunction 06/13/2013   HLD (hyperlipidemia) 06/13/2013   Essential hypertension 06/03/2013   Bradycardia by electrocardiogram 06/03/2013   Obstructive sleep apnea 03/12/2013   Leta Speller, MS, OTR/L  Darleene Cleaver, OT 06/09/2021, 8:08 PM  [place] MAIN [facility] SERVICES [address] [place], [place], [address] Phone: [phone]   Fax:  [phone]  Name: [name] MRN: [id number] Date of Birth: Dec 08, 1950

## 2021-06-10 DIAGNOSIS — M5416 Radiculopathy, lumbar region: Secondary | ICD-10-CM | POA: Diagnosis not present

## 2021-06-10 DIAGNOSIS — Z79899 Other long term (current) drug therapy: Secondary | ICD-10-CM | POA: Diagnosis not present

## 2021-06-10 DIAGNOSIS — M47896 Other spondylosis, lumbar region: Secondary | ICD-10-CM | POA: Diagnosis not present

## 2021-06-10 DIAGNOSIS — G894 Chronic pain syndrome: Secondary | ICD-10-CM | POA: Diagnosis not present

## 2021-06-14 ENCOUNTER — Encounter: Payer: Medicare PPO | Admitting: Speech Pathology

## 2021-06-14 ENCOUNTER — Ambulatory Visit: Payer: Medicare PPO | Admitting: Physical Therapy

## 2021-06-14 ENCOUNTER — Ambulatory Visit: Payer: Medicare PPO

## 2021-06-16 ENCOUNTER — Encounter: Payer: Self-pay | Admitting: Physical Therapy

## 2021-06-16 ENCOUNTER — Ambulatory Visit: Payer: Medicare PPO

## 2021-06-16 ENCOUNTER — Ambulatory Visit: Payer: Medicare PPO | Attending: Family Medicine | Admitting: Physical Therapy

## 2021-06-16 ENCOUNTER — Encounter: Payer: Medicare PPO | Admitting: Speech Pathology

## 2021-06-16 ENCOUNTER — Other Ambulatory Visit: Payer: Self-pay

## 2021-06-16 DIAGNOSIS — M6281 Muscle weakness (generalized): Secondary | ICD-10-CM

## 2021-06-16 DIAGNOSIS — R2681 Unsteadiness on feet: Secondary | ICD-10-CM | POA: Insufficient documentation

## 2021-06-16 DIAGNOSIS — R262 Difficulty in walking, not elsewhere classified: Secondary | ICD-10-CM | POA: Diagnosis not present

## 2021-06-16 DIAGNOSIS — R278 Other lack of coordination: Secondary | ICD-10-CM | POA: Diagnosis not present

## 2021-06-16 DIAGNOSIS — R296 Repeated falls: Secondary | ICD-10-CM | POA: Diagnosis not present

## 2021-06-16 DIAGNOSIS — R269 Unspecified abnormalities of gait and mobility: Secondary | ICD-10-CM

## 2021-06-16 DIAGNOSIS — G20 Parkinson's disease: Secondary | ICD-10-CM | POA: Insufficient documentation

## 2021-06-16 NOTE — Therapy (Signed)
[place] MAIN [facility] SERVICES [address] [place], [place], [address] Phone: [phone]   Fax:  [phone]  Physical Therapy Treatment  Patient Details  Name: [name] MRN: [id number] Date of Birth: Aug 01, 1950 Referring Provider (PT): Elsie Stain, MD   Encounter Date: 06/16/2021   PT End of Session - 06/16/21 1052     Visit Number 44    Number of Visits 48    Date for PT Re-Evaluation 08/23/21    Authorization Type [employer] Choice PPO    Authorization Time Period recert for 5/00-93/81    Progress Note Due on Visit 87    PT Start Time 1055    PT Stop Time 1140    PT Time Calculation (min) 45 min    Equipment Utilized During Treatment Gait belt    Activity Tolerance Patient tolerated treatment well    Behavior During Therapy [facility] for tasks assessed/performed             Past Medical History:  Diagnosis Date   Arthritis    Bradycardia    Cancer ([place]) 2013   skin cancer   Depression    ptsd   Dysrhythmia    chronic slow heart rate   GERD (gastroesophageal reflux disease)    Headache(784.0)    tension headaches non recent   History of chicken pox    History of kidney stones    passed   Hypertension    treated with HCTZ   Pacemaker    Parkinson's disease ([place])    dx'ed 15 years ago   PTSD (post-traumatic stress disorder)    Shortness of breath dyspnea    Sleep apnea    doesn't use C-pap   Varicose veins     Past Surgical History:  Procedure Laterality Date   CHOLECYSTECTOMY N/A 10/22/2014   Procedure: LAPAROSCOPIC CHOLECYSTECTOMY WITH INTRAOPERATIVE CHOLANGIOGRAM;  Surgeon: Dia Crawford III, MD;  Location: [facility] ORS;  Service: General;  Laterality: N/A;   cyst removed      from lip as a child   INTRAMEDULLARY (IM) NAIL INTERTROCHANTERIC N/A 02/18/2019   Procedure: INTRAMEDULLARY (IM) NAIL INTERTROCHANTRIC, RIGHT,;  Surgeon: Thornton Park, MD;  Location: [facility] ORS;  Service: Orthopedics;  Laterality: N/A;    LUMBAR LAMINECTOMY/DECOMPRESSION MICRODISCECTOMY Bilateral 12/14/2012   Procedure: Bilateral lumbar three-four, four-five decompressive laminotomy/foraminotomy;  Surgeon: Charlie Pitter, MD;  Location: [place] NEURO ORS;  Service: Neurosurgery;  Laterality: Bilateral;   PULSE GENERATOR IMPLANT Bilateral 12/13/2013   Procedure: Bilateral implantable pulse generator placement;  Surgeon: Erline Levine, MD;  Location: [place] NEURO ORS;  Service: Neurosurgery;  Laterality: Bilateral;  Bilateral implantable pulse generator placement   skin cancer removed     from ears,   12 lft arm  rt leg 15   SUBTHALAMIC STIMULATOR BATTERY REPLACEMENT Bilateral 07/14/2017   Procedure: BILATERAL IMPLANTED PULSE GENERATOR CHANGE FOR DEEP BRAIN STIMULATOR;  Surgeon: Erline Levine, MD;  Location: [place];  Service: Neurosurgery;  Laterality: Bilateral;   SUBTHALAMIC STIMULATOR INSERTION Bilateral 12/06/2013   Procedure: SUBTHALAMIC STIMULATOR INSERTION;  Surgeon: Erline Levine, MD;  Location: [place] NEURO ORS;  Service: Neurosurgery;  Laterality: Bilateral;  Bilateral deep brain stimulator placement    There were no vitals filed for this visit.   Subjective Assessment - 06/16/21 1051     Subjective Pt reports some back pain at this time and no falls or LOB since previous session. Pt is in process of being scheduled with orthopedic MD for injection fo rlow back  pain. No other significant updates since last session.    Patient is accompained by: Family member    Pertinent History [name] is a 73yoM who comes to [facility] neuro for evaluation of conitnued difficulty of imbalance, unsteadiness on feet, and generally limited tolerance to mobility in the setting of chronic parkinsonism. SInce DC in February, pt has had PPM placement due to bradycardia issues. Pt also started using an UpWalker device 2 week prior to this evaluation, goal was to help with gradual melting of postural extension that occured with sustained upright actiivty. Pt has  continued to practice supervised AMB with caregivers, short distances out of house and in community. Pt continues to experience frequent falls on a weekly basis, at a rate that both he and wife report to be increasing in frequency. PMH: HTN, bradycardia s/p PPM, PD, DBS, COVID-19 infection, chronic low back pain s/p L4/5 surgery.    Limitations Walking;Standing;Sitting;House hold activities;Lifting    How long can you sit comfortably? Not limited    How long can you stand comfortably? 5 minutes with support    How long can you walk comfortably? ~5-6 minutes UpWalker Walker    Patient Stated Goals Reduce falls, improve AMB tolerance    Currently in Pain? Yes    Pain Score 5     Pain Location Back    Pain Orientation Lower    Pain Descriptors / Indicators Aching    Pain Type Chronic pain    Pain Onset More than a month ago    Pain Onset More than a month ago                Exercise/Activity Sets/Reps/Time/ Resistance Assistance Charge type Comments- Unless otherwise stated, CGA was provided and gait belt donned in order to ensure pt safety   Standing PWR! Step  2 x 10 on ea LE   Therex  Cues for proper sequencing as well as for amplitude of movement.   Hip abduction in standing  2 x 10 ea LE with 5# AW donned   Therex  External cues for aiming of LE to ensure proper muscle activation, prior to cues pt performing hip flexion combines with hip abduction movement pattern.   Stair taps to 6 inch step with TB in between feet for external cue to prevent NBOS  2 x 10 x ea LE CGA Gait training  Cues for large amplitude movement, " buzzer" when pt hit green TB for knowledge of performance throughout.   SLS progression- 1 LE on airex, contra on 6 in step plus airex 2 x 30 sec ea   Therex Cues for maintaining knee extension and posture, particularly with R LE posterior/as primary stance LE. Difficulty maintaining TKE on the R > L   LAQ 2 x 10 x 5# on ea LE   There ex To improve knee extension  strength  External cues for full knee extension ROM.   STS on airex pad  1 x 10 standard 1 x 10 with PRW! Up variation  CGA, hands on knees intermittently  There ACT 1 LOB posterior and corrected via PT , frequent cues for UE involvement with PWR! Up variation.   Squat with 1 LE on BOSU  1 x 10 ea LE on airex  CGA  There ex   Ball roll outs 3 way  10 x 5 sec holds each way   therex To improve LB pain and stiffness at start of session.   PRW! Step with  UE assist to airex pad ( external cue for step length)  2 x 10  U UE, CGA  Therex  Pad for increased balance challenge as well as for external cue for step length. Cues for proper UE movement.               Treatment Provided this session   Pt educated throughout session about proper posture and technique with exercises. Improved exercise technique, movement at target joints, use of target muscles after min to mod verbal, visual, tactile cues.  Note: Portions of this document were prepared using Dragon voice recognition software and although reviewed may contain unintentional dictation errors in syntax, grammar, or spelling.                           PT Education - 06/16/21 1051     Education provided Yes    Education Details exercise form and technique    Person(s) Educated Patient    Methods Explanation    Comprehension Verbalized understanding              PT Short Term Goals - 05/31/21 1134       PT SHORT TERM GOAL #1   Title Pt to demonstrate improved sustained AMB tolerance >880ft.    Baseline At evlauation: 67ft tolerance (not to failure); 9/26: 1384 ft    Time 4    Period Weeks    Status Achieved    Target Date 01/05/21      PT SHORT TERM GOAL #2   Title Pt will improve BERG balance score to greater than 45 to indicate decrease risk of falls.    Baseline 2/13:44    Time 4    Period Weeks    Status On-going    Target Date 06/28/21      PT SHORT TERM GOAL #3   Title Pt to demonstrate  10MWT<13sec to show self-selected gait speed appropriate for AMB outside of house and to improve energy efficiency.    Baseline UpWalker Eval: 17.53sec; 9/26: 0.74 m/s (13.5)11/21: 13.68 (.73 m/s) 12.64 on 12/28 9.88 pn2/13    Time 4    Period Weeks    Status Achieved    Target Date 02/08/21               PT Long Term Goals - 05/31/21 1136       PT LONG TERM GOAL #1   Title Pt will improve BERG balance score to greater than 47 to indicate decreaed risk of falls.    Baseline 41 on 12/28 44 on 2/13    Time 8    Period Weeks    Status On-going    Target Date 07/26/21      PT LONG TERM GOAL #2   Title Pt to tolerate 1014ft sustained AMB with UpWalker without rest interval to improve safety and independence in limited community AMB.    Baseline At eval; tired after 662ft; 9/26: 1384 ft    Time 8    Period Weeks    Status Achieved    Target Date 05/03/21      PT LONG TERM GOAL #3   Title Pt to demonstrate improved FOTO score by 5 points compared to initital assessment.    Baseline 9/26: 49 (eval was 50) 911/21: 52 12/28 deferred as wife not present and pt not best historian 2/13: 43    Time 8    Period Weeks    Status On-going  Target Date 07/26/21      PT LONG TERM GOAL #4   Title Pt to show tolerance of AMB>1344ft with UpWalker to improve tolerance to accessing the community for IADL.    Baseline Eval: 642ft;  9/26: 1385    Time 12    Period Weeks    Status Achieved    Target Date --      PT LONG TERM GOAL #5   Title Patient will increase six minute walk test distance to >1000 for progression to community ambulator and improve gait ability    Baseline 9/26: new 11/21: 850 feet with up Walker 12/28: 925 feet with up walker, 980 feet with up walker    Time 8    Period Weeks    Status On-going    Target Date 07/26/21      PT LONG TERM GOAL #6   Title Patient will experience 1 fall or less per week for at least 3 weeks in order to improve his frequency of falls and  prevent injury from falls    Baseline On average multiple falls per week.    Time 12    Period Weeks    Status New    Target Date 08/23/21                   Plan - 06/16/21 1052     Clinical Impression Statement Pt presents with good motivation forcompletion of PT session.  Patient tolerated all interventions well with no increase in lower back pain. Patient progressed with several lower extremity strengthening and coordination activities this session. Patient also continues to show progress with lower extremity stability when ambulating without assistive device for short distances in the clinic, however, patient does require contact-guard assist for safety during these transitions. Attempted some gait training with external cues on walker this session and pt tolerated this well and we will continue to work on this in future session to improve BOS with ambualtion. Pt will continue to benefit from skilled PT interventions in order to improve LE strength, ambuilatory capacity,. reduce fall risk and improve QOL.    Personal Factors and Comorbidities Age;Past/Current Experience;Time since onset of injury/illness/exacerbation;Transportation    Comorbidities Chronic back pain, orthostatic hypotension, repeated falls, parkinsonism    Examination-Activity Limitations Bathing;Bed Mobility;Caring for Others;Bend;Dressing;Lift;Locomotion Level;Reach Overhead;Sit;Squat;Stairs;Stand;Toileting;Transfers;Carry    Examination-Participation Restrictions Cleaning;Community Activity;Interpersonal Relationship;Shop;Volunteer;Yard Work;Other;Driving    Stability/Clinical Decision Making Evolving/Moderate complexity    Rehab Potential Fair    PT Frequency 2x / week    PT Duration 12 weeks    PT Treatment/Interventions ADLs/Self Care Home Management;Aquatic Therapy;Cryotherapy;Electrical Stimulation;Iontophoresis 4mg /ml Dexamethasone;Moist Heat;Ultrasound;Contrast Bath;DME Instruction;Gait training;Stair  training;Functional mobility training;Therapeutic activities;Therapeutic exercise;Balance training;Neuromuscular re-education;Patient/family education;Manual techniques;Wheelchair mobility training;Energy conservation;Passive range of motion;Traction;Orthotic Fit/Training;Dry needling;Vestibular;Visual/perceptual remediation/compensation;Taping;Canalith Repostioning;Cognitive remediation    PT Next Visit Plan Progress static and dynamic balance and continue with caregiver/patient ed with low back stretching    PT Home Exercise Plan None set up at evaluation, still working on previous HEP from 6 months prior (STS, daily walking); 8/29: issued STS with focus on large amplitude movement at support surface; 9/12: PT provides handout with sit to stand exercise and large amplitude seated trunk rotation exercise.  9/19: reinforced HEP, provided handout for seated postural exercise with UE abduction, and seated exercise with trunk twists and UE reach, 10x for each, can be performed daily or minimum of 4x/week. No changes today.    Consulted and Agree with Plan of Care Patient;Family member/caregiver    Family Member Consulted  Wife             Patient will benefit from skilled therapeutic intervention in order to improve the following deficits and impairments:  Abnormal gait, Decreased balance, Decreased coordination, Decreased mobility, Postural dysfunction, Decreased strength, Decreased safety awareness, Improper body mechanics, Impaired flexibility, Decreased activity tolerance, Decreased endurance, Decreased knowledge of precautions, Difficulty walking, Pain, Cardiopulmonary status limiting activity, Decreased range of motion, Impaired perceived functional ability, Decreased cognition, Dizziness  Visit Diagnosis: Abnormality of gait and mobility  Difficulty in walking, not elsewhere classified  Repeated falls     Problem List Patient Active Problem List   Diagnosis Date Noted   Sinus drainage  12/09/2020   Medicare annual wellness visit, subsequent 11/18/2020   Pacemaker    Dementia associated with Parkinson's disease ([place]) 06/17/2020   Vertigo 02/19/2020   Urine abnormality 02/19/2020   Dysphagia 02/23/2019   Fall at home 10/18/2017   REM behavioral disorder 11/02/2016   Radicular pain in right arm 10/09/2016   GERD (gastroesophageal reflux disease) 07/31/2016   Colon cancer screening 07/23/2015   Gout 02/18/2015   Depression 01/06/2015   Skin lesion 10/02/2014   Dupuytren's contracture 08/20/2014   Cough 07/21/2014   Lumbar stenosis with neurogenic claudication 06/13/2014   S/P deep brain stimulator placement 05/08/2014   Advance care planning 01/19/2014   Parkinson's disease ([place]) 12/13/2013   PTSD (post-traumatic stress disorder) 06/13/2013   Erectile dysfunction 06/13/2013   HLD (hyperlipidemia) 06/13/2013   Essential hypertension 06/03/2013   Bradycardia by electrocardiogram 06/03/2013   Obstructive sleep apnea 03/12/2013    Particia Lather, PT 06/16/2021, 12:10 PM  [place] MAIN [facility] SERVICES [address] [place], [place], [address] Phone: [phone]   Fax:  [phone]  Name: [name] MRN: [id number] Date of Birth: 12-13-1950

## 2021-06-16 NOTE — Therapy (Signed)
[place] MAIN [facility] SERVICES [address] [place], [place], [address] Phone: [phone]   Fax:  [phone]  Occupational Therapy Treatment  Patient Details  Name: [name] MRN: [id number] Date of Birth: 1951-03-11 Referring Provider (OT): Dr. Sharolyn Douglas (neurologist at [facility])   Encounter Date: 06/16/2021   OT End of Session - 06/16/21 1306     Visit Number 9    Number of Visits 24    Date for OT Re-Evaluation 08/03/21    OT Start Time 5643    OT Stop Time 1229    OT Time Calculation (min) 44 min    Equipment Utilized During Treatment rollator    Activity Tolerance Patient tolerated treatment well    Behavior During Therapy [facility] for tasks assessed/performed             Past Medical History:  Diagnosis Date   Arthritis    Bradycardia    Cancer ([place]) 2013   skin cancer   Depression    ptsd   Dysrhythmia    chronic slow heart rate   GERD (gastroesophageal reflux disease)    Headache(784.0)    tension headaches non recent   History of chicken pox    History of kidney stones    passed   Hypertension    treated with HCTZ   Pacemaker    Parkinson's disease ([place])    dx'ed 15 years ago   PTSD (post-traumatic stress disorder)    Shortness of breath dyspnea    Sleep apnea    doesn't use C-pap   Varicose veins     Past Surgical History:  Procedure Laterality Date   CHOLECYSTECTOMY N/A 10/22/2014   Procedure: LAPAROSCOPIC CHOLECYSTECTOMY WITH INTRAOPERATIVE CHOLANGIOGRAM;  Surgeon: Dia Crawford III, MD;  Location: [facility] ORS;  Service: General;  Laterality: N/A;   cyst removed      from lip as a child   INTRAMEDULLARY (IM) NAIL INTERTROCHANTERIC N/A 02/18/2019   Procedure: INTRAMEDULLARY (IM) NAIL INTERTROCHANTRIC, RIGHT,;  Surgeon: Thornton Park, MD;  Location: [facility] ORS;  Service: Orthopedics;  Laterality: N/A;   LUMBAR LAMINECTOMY/DECOMPRESSION MICRODISCECTOMY Bilateral 12/14/2012   Procedure: Bilateral lumbar  three-four, four-five decompressive laminotomy/foraminotomy;  Surgeon: Charlie Pitter, MD;  Location: [place] [facility] ORS;  Service: Neurosurgery;  Laterality: Bilateral;   PULSE GENERATOR IMPLANT Bilateral 12/13/2013   Procedure: Bilateral implantable pulse generator placement;  Surgeon: Erline Levine, MD;  Location: [place] [facility];  Service: Neurosurgery;  Laterality: Bilateral;  Bilateral implantable pulse generator placement   skin cancer removed     from ears,   12 lft arm  rt leg 15   SUBTHALAMIC STIMULATOR BATTERY REPLACEMENT Bilateral 07/14/2017   Procedure: BILATERAL IMPLANTED PULSE GENERATOR CHANGE FOR DEEP BRAIN STIMULATOR;  Surgeon: Erline Levine, MD;  Location: [place];  Service: Neurosurgery;  Laterality: Bilateral;   SUBTHALAMIC STIMULATOR INSERTION Bilateral 12/06/2013   Procedure: SUBTHALAMIC STIMULATOR INSERTION;  Surgeon: Erline Levine, MD;  Location: [place] [facility] OR[facility];  Service: Neurosurgery;  Laterality: Bilateral;  Bilateral deep brain stimulator placement    There were no vitals filed for this visit.   Subjective Assessment - 06/16/21 1304     Subjective  "He slept a lot the other day.  We had to wake him up at 1." (per spouse)    Patient is accompanied by: Family member    Pertinent History Parkinson's Disease Diagnosed in 2000, frequent falls    Limitations UE/LE tremors, decreased balance, lack of coordination, intermittent diplopia (has prism glasses)  Patient Stated Goals Pt/spouse verbalize desire to improve [facility] skills for holding playing cards, and improving shoulder flexibility and posture for better walker use.    Currently in Pain? Yes    Pain Score 5     Pain Location Back    Pain Orientation Lower    Pain Descriptors / Indicators Aching    Pain Type Chronic pain    Pain Radiating Towards L hip    Pain Onset More than a month ago    Pain Frequency Intermittent    Aggravating Factors  walking, transfers    Pain Relieving Factors rest, heat    Effect of Pain on Daily  Activities difficulty with ADLs and walking    Multiple Pain Sites No    Pain Onset More than a month ago             Occupational Therapy Treatment: Therapeutic Activity: Pt participated in playing card activities sitting EOB to facilitate core strengthening.  Challenged core stability with lateral reach R/L, trunk rotation R/L reaching behind himself to the R and L, working to return to midline after each reach.  Pt maintained steady midline sitting with all static and dynamic sitting tasks for 35 min.  Focussed also on FMC/dexterity shuffling, dealing, flipping, and sorting cards.  Pt self corrected with 1 sorting error, dropped a couple of cards with shuffling, but otherwise was able to deal and sort with extra time d/t bradykinesia.   Response to Treatment: See Plan/clinical impression below.     OT Education - 06/16/21 1305     Education Details core strengthening activities    Person(s) Educated Patient;Spouse    Methods Explanation;Verbal cues;Demonstration    Comprehension Verbalized understanding;Returned demonstration;Verbal cues required;Need further instruction              OT Short Term Goals - 05/13/21 0834       OT SHORT TERM GOAL #1   Title Pt/caregiver will be indep with HEP    Baseline Eval: not yet initiated    Time 6    Period Weeks    Status New    Target Date 06/22/21               OT Long Term Goals - 05/13/21 0836       OT LONG TERM GOAL #1   Title Pt will improve FOTO score to 40 or better to indicate increased functional improvement.    Baseline Eval: FOTO 34    Time 12    Period Weeks    Status New    Target Date 08/03/21      OT LONG TERM GOAL #2   Title Pt will improve R lateral pinch strength to be able to tolerate holding playing cards in R dominant hand for game of cards.    Baseline Eval: R lateral pinch 16 lbs; pt enjoys playing cards but is not currently playing d/t decreased strength/coordination    Time 12     Period Weeks    Status New    Target Date 08/03/21      OT LONG TERM GOAL #3   Title Pt will improve R dominant hand to mouth coordination to self feed with minimal spilling and utilize adapted utensils as needed.    Baseline Eval: Spouse reports eating is messy and pt struggles to keep utensil level.    Time 12    Period Weeks    Status New    Target Date 08/03/21  OT LONG TERM GOAL #4   Title Pt will improve bilat shoulder flex/abd for improved overhead reach during self care.    Baseline Eval: R shoulder flex 110, L 120, R abd 125, L 165    Time 12    Period Weeks    Status New    Target Date 08/03/21      OT LONG TERM GOAL #5   Title Pt will improve [place] in bilat hands as noted by improvement in 9 hole peg test score by 5 or more seconds to enable participate in family board games.    Baseline Eval: R/L 9 hole peg test 41 secs (pt has not recently participated in board games)    Time 12    Period Weeks    Status New    Target Date 08/03/21      Long Term Additional Goals   Additional Long Term Goals Yes      OT LONG TERM GOAL #6   Title Pt will be able to initiate simple texts and reply to texts on phone with min vc.    Baseline Eval: Pt struggles with texting    Time 12    Period Weeks    Status New    Target Date 08/03/21      OT LONG TERM GOAL #7   Title Pt will improve posture for ADLs and functional mobility as demonstrated by ability to keep feet within back two legs of rollator 50% of the time with min vc.    Baseline Eval: pt with forward lean and feet are behind back 2 legs of walker during functional mobility.    Time 12    Status New    Target Date 08/03/21                   Plan - 06/16/21 1316     Clinical Impression Statement Pt missed first session this week d/t increased pain, not feeling well, slept a lot that day.  Feeling better today.  Pt participated in playing card activities sitting EOB to facilitate core strengthening.   Challenged core stability with lateral reach R/L, trunk rotation R/L reaching behind himself to the R and L, working to return to midline after each reach.  Pt maintained steady midline sitting with all static and dynamic sitting tasks for 35 min.  Focussed also on FMC/dexterity shuffling, dealing, flipping, and sorting cards.  Pt self corrected with 1 sorting error, dropped a couple of cards with shuffling, but otherwise was able to deal and sort with extra time d/t bradykinesia.  Pt will continue to benefit from skilled OT for ADL training, compensatory ADL techniques, and therapeutic exercise to increase bilat hand coordination, bilat shoulder flexibility, and core strength for better stability and posture during self care and functional mobility.    OT Occupational Profile and History Problem Focused Assessment - Including review of records relating to presenting problem    Occupational performance deficits (Please refer to evaluation for details): ADL's;IADL's;Leisure    Body Structure / Function / Physical Skills ADL;Coordination;GMC;UE functional use;Balance;Body mechanics;Decreased knowledge of use of DME;Flexibility;Dexterity;FMC;Strength;ROM    Cognitive Skills Attention;Memory    Rehab Potential Good    Clinical Decision Making Several treatment options, min-mod task modification necessary    Comorbidities Affecting Occupational Performance: May have comorbidities impacting occupational performance    Modification or Assistance to Complete Evaluation  No modification of tasks or assist necessary to complete eval    OT Frequency 2x / week  OT Duration 12 weeks    OT Treatment/Interventions Self-care/ADL training;Therapeutic exercise;DME and/or AE instruction;Functional Mobility Training;Cognitive remediation/compensation;Balance training;Neuromuscular education;Manual Therapy;Moist Heat;Energy conservation;Passive range of motion;Therapeutic activities;Patient/family education    OT Home  Exercise Plan not yet initiated at eval    Consulted and Agree with Plan of Care Patient;Family member/caregiver    Family Member Consulted spouse, [name]             Patient will benefit from skilled therapeutic intervention in order to improve the following deficits and impairments:   Body Structure / Function / Physical Skills: ADL, Coordination, GMC, UE functional use, Balance, Body mechanics, Decreased knowledge of use of DME, Flexibility, Dexterity, FMC, Strength, ROM Cognitive Skills: Attention, Memory     Visit Diagnosis: Muscle weakness (generalized)  Other lack of coordination  Parkinson's disease ([place])    Problem List Patient Active Problem List   Diagnosis Date Noted   Sinus drainage 12/09/2020   Medicare annual wellness visit, subsequent 11/18/2020   Pacemaker    Dementia associated with Parkinson's disease ([place]) 06/17/2020   Vertigo 02/19/2020   Urine abnormality 02/19/2020   Dysphagia 02/23/2019   Fall at home 10/18/2017   REM behavioral disorder 11/02/2016   Radicular pain in right arm 10/09/2016   GERD (gastroesophageal reflux disease) 07/31/2016   Colon cancer screening 07/23/2015   Gout 02/18/2015   Depression 01/06/2015   Skin lesion 10/02/2014   Dupuytren's contracture 08/20/2014   Cough 07/21/2014   Lumbar stenosis with neurogenic claudication 06/13/2014   S/P deep brain stimulator placement 05/08/2014   Advance care planning 01/19/2014   Parkinson's disease ([place]) 12/13/2013   PTSD (post-traumatic stress disorder) 06/13/2013   Erectile dysfunction 06/13/2013   HLD (hyperlipidemia) 06/13/2013   Essential hypertension 06/03/2013   Bradycardia by electrocardiogram 06/03/2013   Obstructive sleep apnea 03/12/2013   Leta Speller, MS, OTR/L  Darleene Cleaver, OT 06/16/2021, 1:17 PM  [place] MAIN [facility] SERVICES [address] [place], [place], [address] Phone: [phone]   Fax:  [phone]  Name:  [name] MRN: [id number] Date of Birth: 1950-07-15

## 2021-06-21 ENCOUNTER — Encounter: Payer: Medicare PPO | Admitting: Speech Pathology

## 2021-06-21 ENCOUNTER — Other Ambulatory Visit: Payer: Self-pay

## 2021-06-21 ENCOUNTER — Ambulatory Visit: Payer: Medicare PPO

## 2021-06-21 DIAGNOSIS — R2681 Unsteadiness on feet: Secondary | ICD-10-CM | POA: Diagnosis not present

## 2021-06-21 DIAGNOSIS — R278 Other lack of coordination: Secondary | ICD-10-CM | POA: Diagnosis not present

## 2021-06-21 DIAGNOSIS — M6281 Muscle weakness (generalized): Secondary | ICD-10-CM

## 2021-06-21 DIAGNOSIS — G20 Parkinson's disease: Secondary | ICD-10-CM

## 2021-06-21 DIAGNOSIS — R296 Repeated falls: Secondary | ICD-10-CM | POA: Diagnosis not present

## 2021-06-21 DIAGNOSIS — R269 Unspecified abnormalities of gait and mobility: Secondary | ICD-10-CM | POA: Diagnosis not present

## 2021-06-21 DIAGNOSIS — R262 Difficulty in walking, not elsewhere classified: Secondary | ICD-10-CM | POA: Diagnosis not present

## 2021-06-21 NOTE — Therapy (Signed)
[place] MAIN [facility] SERVICES [address] [place], [place], [address] Phone: [phone]   Fax:  [phone]  Physical Therapy Treatment  Patient Details  Name: [name] MRN: [id number] Date of Birth: March 01, 1951 Referring Provider (PT): Elsie Stain, MD   Encounter Date: 06/21/2021   PT End of Session - 06/21/21 1032     Visit Number 45    Number of Visits 62    Date for PT Re-Evaluation 08/23/21    Authorization Type [employer] Choice PPO    Authorization Time Period recert for 2/19-75/88    Progress Note Due on Visit 2    PT Start Time 1100    PT Stop Time 1144    PT Time Calculation (min) 44 min    Equipment Utilized During Treatment Gait belt    Activity Tolerance Patient tolerated treatment well    Behavior During Therapy WFL for tasks assessed/performed             Past Medical History:  Diagnosis Date   Arthritis    Bradycardia    Cancer ([place]) 2013   skin cancer   Depression    ptsd   Dysrhythmia    chronic slow heart rate   GERD (gastroesophageal reflux disease)    Headache(784.0)    tension headaches non recent   History of chicken pox    History of kidney stones    passed   Hypertension    treated with HCTZ   Pacemaker    Parkinson's disease ([place])    dx'ed 15 years ago   PTSD (post-traumatic stress disorder)    Shortness of breath dyspnea    Sleep apnea    doesn't use C-pap   Varicose veins     Past Surgical History:  Procedure Laterality Date   CHOLECYSTECTOMY N/A 10/22/2014   Procedure: LAPAROSCOPIC CHOLECYSTECTOMY WITH INTRAOPERATIVE CHOLANGIOGRAM;  Surgeon: Dia Crawford III, MD;  Location: [facility] ORS;  Service: General;  Laterality: N/A;   cyst removed      from lip as a child   INTRAMEDULLARY (IM) NAIL INTERTROCHANTERIC N/A 02/18/2019   Procedure: INTRAMEDULLARY (IM) NAIL INTERTROCHANTRIC, RIGHT,;  Surgeon: Thornton Park, MD;  Location: [facility] ORS;  Service: Orthopedics;  Laterality: N/A;    LUMBAR LAMINECTOMY/DECOMPRESSION MICRODISCECTOMY Bilateral 12/14/2012   Procedure: Bilateral lumbar three-four, four-five decompressive laminotomy/foraminotomy;  Surgeon: Charlie Pitter, MD;  Location: [place] [facility]URO ORS;  Service: Neurosurgery;  Laterality: Bilateral;   PULSE GENERATOR IMPLANT Bilateral 12/13/2013   Procedure: Bilateral implantable pulse generator placement;  Surgeon: Erline Levine, MD;  Location: [place] NEURO ORS;  Service: Neurosurgery;  Laterality: Bilateral;  Bilateral implantable pulse generator placement   skin cancer removed     from ears,   12 lft arm  rt leg 15   SUBTHALAMIC STIMULATOR BATTERY REPLACEMENT Bilateral 07/14/2017   Procedure: BILATERAL IMPLANTED PULSE GENERATOR CHANGE FOR DEEP BRAIN STIMULATOR;  Surgeon: Erline Levine, MD;  Location: [place];  Service: Neurosurgery;  Laterality: Bilateral;   SUBTHALAMIC STIMULATOR INSERTION Bilateral 12/06/2013   Procedure: SUBTHALAMIC STIMULATOR INSERTION;  Surgeon: Erline Levine, MD;  Location: [place] NEUR[facility] ORS;  Service: Neurosurgery;  Laterality: Bilateral;  Bilateral deep brain stimulator placement    There were no vitals filed for this visit.   Subjective Assessment - 06/21/21 1108     Subjective Patient reports his back pain is improving with it primarily bothering him during his sit to stands. No falls since last session.    Patient is accompained by: Family member  Pertinent History [name] is a 17yoM who comes [facility]PP[facility] neuro for evaluation of conitnued difficulty of imbalance, unsteadiness on feet, and generally limited tolerance to mobility in the setting of chronic parkinsonism. SInce DC in February, pt has had PPM placement due to bradycardia issues. Pt also started using an UpWalker device 2 week prior to this evaluation, goal was to help with gradual melting of postural extension that occured with sustained upright actiivty. Pt has continued to practice supervised AMB with caregivers, short distances out of house and in  community. Pt continues to experience frequent falls on a weekly basis, at a rate that both he and wife report to be increasing in frequency. PMH: HTN, bradycardia s/p PPM, PD, DBS, COVID-19 infection, chronic low back pain s/p L4/5 surgery.    Limitations Walking;Standing;Sitting;House hold activities;Lifting    How long can you sit comfortably? Not limited    How long can you stand comfortably? 5 minutes with support    How long can you walk comfortably? ~5-6 minutes UpWalker Walker    Patient Stated Goals Reduce falls, improve AMB tolerance    Currently in Pain? Yes    Pain Score 3     Pain Location Back    Pain Orientation Lower    Pain Descriptors / Indicators Aching    Pain Type Chronic pain    Pain Onset More than a month ago    Pain Frequency Intermittent                 Exercise/Activity Sets/Reps/Time/ Resistance Assistance Charge type Comments- Unless otherwise stated, CGA was provided and gait belt donned in order to ensure pt safety    Step over theraband, clap and step back  12each LE CGA Neuro re-ed  Challenging to maintain sequence.   Hip abduction in standing  2 x 10 ea LE with 5# AW donned    Therex  External cues for aiming of LE to ensure proper muscle activation, prior to cues pt performing hip flexion combines with hip abduction movement pattern.   Stair taps to 6 inch step with TB in between feet for external cue to prevent NBOS  2 x 10 x ea LE CGA Gait training  Cues for large amplitude movement, " buzzer" when pt hit green TB for knowledge of performance throughout.   SLS progression- 1 LE on airex, contra on 6 in step plus airex 2 x 30 sec ea    Neuro Re-ed Cues for maintaining knee extension and posture, particularly with R LE posterior/as primary stance LE. Difficulty maintaining TKE on the R > L   LAQ 2 x 10 x 5# on ea LE    There ex To improve knee extension strength  External cues for full knee extension ROM.   GTB Row seated 15x  TherEx Cues for  upright posture   Airex pad PVC pipe chest press 10x, straight arm raise 10x 10x each  CGA Neuro Re-ed Cues for upright posture    forward/backwards stepping with 2x4 between feet   10x length of // bars  BUE support, CGA   therex  cues for widening BOS, occasional drifting of RLE    5lb ankle weight hip extension   2x10 each LE   BUE support CGA  Therex  cues for sequencing and glute squeeze.   Treatment Provided this session    Pt educated throughout session about proper posture and technique with exercises. Improved exercise technique, movement at target joints, use of  target muscles after min to mod verbal, visual, tactile cues     Patient present to physical therapy with excellent motivation. He does have occasional drifting of RLE with dual task and distraction which will be an area of continued monitoring. Patient is able to self correct posture occasional but frequently requires external cueing or use of mirror for visual feedback. Strengthening tasks tolerated well with no pain, only fatigue. Patient will continue to benefit from skilled physical therapy intervention in order to improve his lower extremity strength, balance, reduce falls, improve his safety with everyday activities                    PT Education - 06/21/21 1032     Education provided Yes    Education Details exercise technique, body mechanics    Person(s) Educated Patient    Methods Explanation;Tactile cues;Demonstration;Verbal cues    Comprehension Verbalized understanding;Returned demonstration;Verbal cues required;Tactile cues required              PT Short Term Goals - 05/31/21 1134       PT SHORT TERM GOAL #1   Title Pt to demonstrate improved sustained AMB tolerance >814f.    Baseline At evlauation: 6014ftolerance (not to failure); 9/26: 1384 ft    Time 4    Period Weeks    Status Achieved    Target Date 01/05/21      PT SHORT TERM GOAL #2   Title Pt will improve BERG balance  score to greater than 45 to indicate decrease risk of falls.    Baseline 2/13:44    Time 4    Period Weeks    Status On-going    Target Date 06/28/21      PT SHORT TERM GOAL #3   Title Pt to demonstrate 10MWT<13sec to show self-selected gait speed appropriate for AMB outside of house and to improve energy efficiency.    Baseline UpWalker Eval: 17.53sec; 9/26: 0.74 m/s (13.5)11/21: 13.68 (.73 m/s) 12.64 on 12/28 9.88 pn2/13    Time 4    Period Weeks    Status Achieved    Target Date 02/08/21               PT Long Term Goals - 05/31/21 1136       PT LONG TERM GOAL #1   Title Pt will improve BERG balance score to greater than 47 to indicate decreaed risk of falls.    Baseline 41 on 12/28 44 on 2/13    Time 8    Period Weeks    Status On-going    Target Date 07/26/21      PT LONG TERM GOAL #2   Title Pt to tolerate 100067fustained AMB with UpWalker without rest interval to improve safety and independence in limited community AMB.    Baseline At eval; tired after 600f79f/26: 1384 ft    Time 8    Period Weeks    Status Achieved    Target Date 05/03/21      PT LONG TERM GOAL #3   Title Pt to demonstrate improved FOTO score by 5 points compared to initital assessment.    Baseline 9/26: 49 (eval was 50) 911/21: 52 12/28 deferred as wife not present and pt not best historian 2/13: 43    Time 8    Period Weeks    Status On-going    Target Date 07/26/21      PT LONG TERM GOAL #4   Title  Pt to show tolerance of AMB>1317f with UpWalker to improve tolerance to accessing the community for IADL.    Baseline Eval: 6059f  9/26: 1385    Time 12    Period Weeks    Status Achieved    Target Date --      PT LONG TERM GOAL #5   Title Patient will increase six minute walk test distance to >1000 for progression to community ambulator and improve gait ability    Baseline 9/26: new 11/21: 850 feet with up Walker 12/28: 925 feet with up walker, 980 feet with up walker    Time 8     Period Weeks    Status On-going    Target Date 07/26/21      PT LONG TERM GOAL #6   Title Patient will experience 1 fall or less per week for at least 3 weeks in order to improve his frequency of falls and prevent injury from falls    Baseline On average multiple falls per week.    Time 12    Period Weeks    Status New    Target Date 08/23/21                   Plan - 06/21/21 1213     Clinical Impression Statement Patient present to physical therapy with excellent motivation. He does have occasional drifting of RLE with dual task and distraction which will be an area of continued monitoring. Patient is able to self correct posture occasional but frequently requires external cueing or use of mirror for visual feedback. Strengthening tasks tolerated well with no pain, only fatigue. Patient will continue to benefit from skilled physical therapy intervention in order to improve his lower extremity strength, balance, reduce falls, improve his safety with everyday activities    Personal Factors and Comorbidities Age;Past/Current Experience;Time since onset of injury/illness/exacerbation;Transportation    Comorbidities Chronic back pain, orthostatic hypotension, repeated falls, parkinsonism    Examination-Activity Limitations Bathing;Bed Mobility;Caring for Others;Bend;Dressing;Lift;Locomotion Level;Reach Overhead;Sit;Squat;Stairs;Stand;Toileting;Transfers;Carry    Examination-Participation Restrictions Cleaning;Community Activity;Interpersonal Relationship;Shop;Volunteer;Yard Work;Other;Driving    Stability/Clinical Decision Making Evolving/Moderate complexity    Rehab Potential Fair    PT Frequency 2x / week    PT Duration 12 weeks    PT Treatment/Interventions ADLs/Self Care Home Management;Aquatic Therapy;Cryotherapy;Electrical Stimulation;Iontophoresis '4mg'$ /ml Dexamethasone;Moist Heat;Ultrasound;Contrast Bath;DME Instruction;Gait training;Stair training;Functional mobility  training;Therapeutic activities;Therapeutic exercise;Balance training;Neuromuscular re-education;Patient/family education;Manual techniques;Wheelchair mobility training;Energy conservation;Passive range of motion;Traction;Orthotic Fit/Training;Dry needling;Vestibular;Visual/perceptual remediation/compensation;Taping;Canalith Repostioning;Cognitive remediation    PT Next Visit Plan Progress static and dynamic balance and continue with caregiver/patient ed with low back stretching    PT Home Exercise Plan None set up at evaluation, still working on previous HEP from 6 months prior (STS, daily walking); 8/29: issued STS with focus on large amplitude movement at support surface; 9/12: PT provides handout with sit to stand exercise and large amplitude seated trunk rotation exercise.  9/19: reinforced HEP, provided handout for seated postural exercise with UE abduction, and seated exercise with trunk twists and UE reach, 10x for each, can be performed daily or minimum of 4x/week. No changes today.    Consulted and Agree with Plan of Care Patient;Family member/caregiver    Family Member Consulted Wife             Patient will benefit from skilled therapeutic intervention in order to improve the following deficits and impairments:  Abnormal gait, Decreased balance, Decreased coordination, Decreased mobility, Postural dysfunction, Decreased strength, Decreased safety awareness, Improper body mechanics, Impaired flexibility, Decreased activity tolerance,  Decreased endurance, Decreased knowledge of precautions, Difficulty walking, Pain, Cardiopulmonary status limiting activity, Decreased range of motion, Impaired perceived functional ability, Decreased cognition, Dizziness  Visit Diagnosis: Difficulty in walking, not elsewhere classified  Muscle weakness (generalized)  Abnormality of gait and mobility  Parkinson's disease ([place])  Unsteadiness on feet     Problem List Patient Active Problem List    Diagnosis Date Noted   Sinus drainage 12/09/2020   Medicare annual wellness visit, subsequent 11/18/2020   Pacemaker    Dementia associated with Parkinson's disease ([place]) 06/17/2020   Vertigo 02/19/2020   Urine abnormality 02/19/2020   Dysphagia 02/23/2019   Fall at home 10/18/2017   REM behavioral disorder 11/02/2016   Radicular pain in right arm 10/09/2016   GERD (gastroesophageal reflux disease) 07/31/2016   Colon cancer screening 07/23/2015   Gout 02/18/2015   Depression 01/06/2015   Skin lesion 10/02/2014   Dupuytren's contracture 08/20/2014   Cough 07/21/2014   Lumbar stenosis with neurogenic claudication 06/13/2014   S/P deep brain stimulator placement 05/08/2014   Advance care planning 01/19/2014   Parkinson's disease ([place]) 12/13/2013   PTSD (post-traumatic stress disorder) 06/13/2013   Erectile dysfunction 06/13/2013   HLD (hyperlipidemia) 06/13/2013   Essential hypertension 06/03/2013   Bradycardia by electrocardiogram 06/03/2013   Obstructive sleep apnea 03/12/2013    Janna Arch, PT, DPT  06/21/2021, 12:14 PM  [place] MAIN [facility] SERVICES [address] [place], [place], [address] Phone: [phone]   Fax:  [phone]  Name: [name] MRN: [id number] Date of Birth: [birth year]

## 2021-06-21 NOTE — Therapy (Signed)
[place] MAIN [facility] SERVICES [address] [place], [place], [address] Phone: [phone]   Fax:  [phone]  Occupational Therapy Treatment/Progress Note Reporting period beginning 05/12/21-06/21/21  Patient Details  Name: [name] MRN: [id number] Date of Birth: [birth year] Referring Provider (OT): Dr. Sharolyn Douglas (neurologist at [facility])   Encounter Date: 06/21/2021   OT End of Session - 06/21/21 1142     Visit Number 10    Number of Visits 24    Date for OT Re-Evaluation 08/03/21    OT Start Time 1937    OT Stop Time 1100    OT Time Calculation (min) 45 min    Equipment Utilized During Treatment rollator    Activity Tolerance Patient tolerated treatment well    Behavior During Therapy [facility] for tasks assessed/performed             Past Medical History:  Diagnosis Date   Arthritis    Bradycardia    Cancer ([place]) 2013   skin cancer   Depression    ptsd   Dysrhythmia    chronic slow heart rate   GERD (gastroesophageal reflux disease)    Headache(784.0)    tension headaches non recent   History of chicken pox    History of kidney stones    passed   Hypertension    treated with HCTZ   Pacemaker    Parkinson's disease ([place])    dx'ed 15 years ago   PTSD (post-traumatic stress disorder)    Shortness of breath dyspnea    Sleep apnea    doesn't use C-pap   Varicose veins     Past Surgical History:  Procedure Laterality Date   CHOLECYSTECTOMY N/A 10/22/2014   Procedure: LAPAROSCOPIC CHOLECYSTECTOMY WITH INTRAOPERATIVE CHOLANGIOGRAM;  Surgeon: Dia Crawford III, MD;  Location: [facility] ORS;  Service: General;  Laterality: N/A;   cyst removed      from lip as a child   INTRAMEDULLARY (IM) NAIL INTERTROCHANTERIC N/A 02/18/2019   Procedure: INTRAMEDULLARY (IM) NAIL INTERTROCHANTRIC, RIGHT,;  Surgeon: Thornton Park, MD;  Location: [facility] ORS;  Service: Orthopedics;  Laterality: N/A;   LUMBAR LAMINECTOMY/DECOMPRESSION MICRODISCECTOMY  Bilateral 12/14/2012   Procedure: Bilateral lumbar three-four, four-five decompressive laminotomy/foraminotomy;  Surgeon: Charlie Pitter, MD;  Location: [place] NEURO ORS;  Service: Neurosurgery;  Laterality: Bilateral;   PULSE GENERATOR IMPLANT Bilateral 12/13/2013   Procedure: Bilateral implantable pulse generator placement;  Surgeon: Erline Levine, MD;  Location: [place] NEURO ORS;  Service: Neurosurgery;  Laterality: Bilateral;  Bilateral implantable pulse generator placement   skin cancer removed     from ears,   12 lft arm  rt leg 15   SUBTHALAMIC STIMULATOR BATTERY REPLACEMENT Bilateral 07/14/2017   Procedure: BILATERAL IMPLANTED PULSE GENERATOR CHANGE FOR DEEP BRAIN STIMULATOR;  Surgeon: Erline Levine, MD;  Location: [place];  Service: Neurosurgery;  Laterality: Bilateral;   SUBTHALAMIC STIMULATOR INSERTION Bilateral 12/06/2013   Procedure: SUBTHALAMIC STIMULATOR INSERTION;  Surgeon: Erline Levine, MD;  Location: [place] NEURO ORS;  Service: Neurosurgery;  Laterality: Bilateral;  Bilateral deep brain stimulator placement    There were no vitals filed for this visit.   Subjective Assessment - 06/21/21 1035     Subjective  "They're not too sore today." (pt referring to shoulders)    Patient is accompanied by: Family member    Pertinent History Parkinson's Disease Diagnosed in 2000, frequent falls    Limitations UE/LE tremors, decreased balance, lack of coordination, intermittent diplopia (has prism glasses)    Patient  Stated Goals Pt/spouse verbalize desire to improve [facility] skills for holding playing cards, and improving shoulder flexibility and posture for better walker use.    Currently in Pain? Yes    Pain Score 3     Pain Location Back    Pain Orientation Lower    Pain Descriptors / Indicators Aching    Pain Type Chronic pain    Pain Radiating Towards L hip    Pain Onset More than a month ago    Pain Frequency Intermittent    Aggravating Factors  walking, transfers    Pain Relieving Factors rest, heat,  pain meds    Effect of Pain on Daily Activities difficulty with ADLs and walking    Multiple Pain Sites No    Pain Onset More than a month ago                [facility] Hights OT Assessment - 06/21/21 0001       Observation/Other Assessments   Focus on Therapeutic Outcomes (FOTO)  40      Coordination   Right 9 Hole Peg Test 41 sec    Left 9 Hole Peg Test 34 sec      AROM   Overall AROM Comments active R shoulder flex 0-115, L 143 ; R shoulder abd 144, L 170      Hand Function   Right Hand Grip (lbs) 69    Right Hand Lateral Pinch 25 lbs    Right Hand 3 Point Pinch 17 lbs    Left Hand Grip (lbs) 68    Left Hand Lateral Pinch 19 lbs    Left 3 point pinch 24 lbs            Occupational Therapy Treatment: Neuro re-ed: Issued pink theraputty and instructed pt in exercises for increasing dexterity.  Pt placed 5 coins and dug out of putty, first trial R hand, 2nd trial L hand.  Pt requires cues to use L hand for entire 2nd trial as he tends to switch back to the R.  Encouraged completion daily.    Therapeutic Exercise: Performed passive and active assisted ROM for bilat shoulders and multiple trials of 9 hole peg test in prep for reassessment of measures.  Vc for technique and positive reinforcement for increasing pace during 9 hole peg test.    Self Care: Pt reports increasing difficulty holding onto his spoon when self feeding.  OT educated on various adapted utensils, but recommended use of foam to build up utensil for easier grasping.  Spouse reports that she thinks they have some at home which she will try to locate.  Pt also reported some difficulty locating mouth, so pt tends to open mouth wider than necessary to "hit his target."  OT educated on diminished proprioceptive sense being common with worsening PD which can affect this aspect of self feeding.  Will continue to address with future sessions d/t time constraints with progress note this session.   Response to  Treatment: See Plan/clinical impression below.    OT Education - 06/21/21 1141     Education Details coordination exercises with theraputty    Person(s) Educated Patient;Spouse    Methods Explanation;Verbal cues;Demonstration    Comprehension Verbalized understanding;Returned demonstration;Verbal cues required;Need further instruction              OT Short Term Goals - 06/21/21 1143       OT SHORT TERM GOAL #1   Title Pt/caregiver will be indep with HEP  Baseline Eval: not yet initiated; Pt has been consistent to send text messages as part of his home activity program/ issued theraputty today with instruction in coordination exercises, further training needed    Time 6    Period Weeks    Status Partially Met    Target Date 06/22/21      OT SHORT TERM GOAL #2   Title --    Baseline Eval: R 41 sec, L 41 sec; 06/21/21: R 41 sec, L 34 sec    Time --    Status On-going    Target Date --               OT Long Term Goals - 06/21/21 1150       OT LONG TERM GOAL #1   Title Pt will improve FOTO score to 40 or better to indicate increased functional improvement.    Baseline Eval: FOTO 34; 06/21/21: FOTO 40    Time 12    Period Weeks    Status On-going    Target Date 08/03/21      OT LONG TERM GOAL #2   Title Pt will improve R lateral pinch strength to be able to tolerate holding playing cards in R dominant hand for game of cards.    Baseline Eval: R lateral pinch 16 lbs; pt enjoys playing cards but is not currently playing d/t decreased strength/coordination; 06/21/21: R 25, L 19 (pt also purchased card holder to compensate for "off" days or times)    Time 12    Period Weeks    Status On-going    Target Date 08/03/21      OT LONG TERM GOAL #3   Title Pt will improve R dominant hand to mouth coordination to self feed with minimal spilling and utilize adapted utensils as needed.    Baseline Eval: Spouse reports eating is messy and pt struggles to keep utensil level;  06/21/21: Pt uses long handled teaspoon; OT encouraged applying foam to build up handle as pt reports it's becoming more difficult to grip.  Pt reports stuggle with hand to mouth coordination and continues to spill.    Time 12    Period Weeks    Status On-going    Target Date 08/03/21      OT LONG TERM GOAL #4   Title Pt will improve bilat shoulder flex/abd for improved overhead reach during self care.    Baseline Eval: R shoulder flex 110, L 120, R abd 125, L 165; R shoulder flex; 06/21/21: R flex 115, L 143; R abd 144, L 170    Time 12    Period Weeks    Status On-going    Target Date 08/03/21      OT LONG TERM GOAL #5   Title Pt will improve FMC in bilat hands as noted by improvement in 9 hole peg test score by 5 or more seconds to enable participate in family board games.    Baseline Eval: R/L 9 hole peg test 41 secs (pt has not recently participated in board games); 06/21/21: R 41 sec, L 34 sec    Time 12    Period Weeks    Status Partially Met    Target Date 08/03/21      OT LONG TERM GOAL #6   Title Pt will be able to initiate simple texts and reply to texts on phone with min vc.    Baseline Eval: Pt struggles with texting; 06/21/21: mod vc to initiate a simple  text, min-mod vc to respond    Time 12    Period Weeks    Status On-going    Target Date 08/03/21      OT LONG TERM GOAL #7   Title Pt will improve posture for ADLs and functional mobility as demonstrated by ability to keep feet within back two legs of rollator 50% of the time with min vc.    Baseline Eval: pt with forward lean and feet are behind back 2 legs of walker during functional mobility; 06/21/21: pt keeps feet within back 2 legs of walker 25% of the time with max vc.    Time 12    Status On-going    Target Date 08/03/21              Plan - 06/21/21 1320     Clinical Impression Statement Pt is making steady gains with OT goals.  Pt has been educated on and utilized various adapted methods for maximizing indep  with self care/leisure tasks, including obtaining card holder for playing cards, using built up spoon for easier grip of utensil, and modifications set for easier text communication on cell phone.  Pt is now initiating short text messages with mod vc and responding to text messages with min vc to navigate between screens, icons, and contacts.  Pt's grip and pinch strength have improved bilaterally, improving ability to hold and manipulate ADL supplies.  L hand 9 hole peg test has improved by 9 sec, R hand no improvement but no decline.  Focus over the last couple of weeks has been promoting core strengthening to improve posture and balance for sitting and standing.  Pt continues to lean forward into RW, requiring max cues to keep feet within back 2 legs of walker; spouse estimates pt keeps feet within back 2 legs ~25% of the time without cues.  Pt has demonstrated decreased R lateral leaning during functional tasks with pt noted to maintain neutral sitting posture without UE support for 35 min last session without cues.  Pt will continue to benefit from skilled OT to work towards goals in [employer], working to maximize indep and efficiency with daily tasks.    OT Occupational Profile and History Problem Focused Assessment - Including review of records relating to presenting problem    Occupational performance deficits (Please refer to evaluation for details): ADL's;IADL's;Leisure    Body Structure / Function / Physical Skills ADL;Coordination;GMC;UE functional use;Balance;Body mechanics;Decreased knowledge of use of DME;Flexibility;Dexterity;FMC;Strength;ROM    Cognitive Skills Attention;Memory    Rehab Potential Good    Clinical Decision Making Several treatment options, min-mod task modification necessary    Comorbidities Affecting Occupational Performance: May have comorbidities impacting occupational performance    Modification or Assistance to Complete Evaluation  No modification of tasks or assist  necessary to complete eval    OT Frequency 2x / week    OT Duration 12 weeks    OT Treatment/Interventions Self-care/ADL training;Therapeutic exercise;DME and/or AE instruction;Functional Mobility Training;Cognitive remediation/compensation;Balance training;Neuromuscular education;Manual Therapy;Moist Heat;Energy conservation;Passive range of motion;Therapeutic activities;Patient/family education    OT Home Exercise Plan not yet initiated at eval    Consulted and Agree with Plan of Care Patient;Family member/caregiver    Family Member Consulted spouse, [name]             Patient will benefit from skilled therapeutic intervention in order to improve the following deficits and impairments:   Body Structure / Function / Physical Skills: ADL, Coordination, GMC, UE functional use, Balance, Body mechanics,  Decreased knowledge of use of DME, Flexibility, Dexterity, [place], Strength, ROM Cognitive Skills: Attention, Memory     Visit Diagnosis: Muscle weakness (generalized)  Other lack of coordination  Parkinson's disease ([place])    Problem List Patient Active Problem List   Diagnosis Date Noted   Sinus drainage 12/09/2020   Medicare annual wellness visit, subsequent 11/18/2020   Pacemaker    Dementia associated with Parkinson's disease ([place]) 06/17/2020   Vertigo 02/19/2020   Urine abnormality 02/19/2020   Dysphagia 02/23/2019   Fall at home 10/18/2017   REM behavioral disorder 11/02/2016   Radicular pain in right arm 10/09/2016   GERD (gastroesophageal reflux disease) 07/31/2016   Colon cancer screening 07/23/2015   Gout 02/18/2015   Depression 01/06/2015   Skin lesion 10/02/2014   Dupuytren's contracture 08/20/2014   Cough 07/21/2014   Lumbar stenosis with neurogenic claudication 06/13/2014   S/P deep brain stimulator placement 05/08/2014   Advance care planning 01/19/2014   Parkinson's disease ([place]) 12/13/2013   PTSD (post-traumatic stress disorder) 06/13/2013   Erectile  dysfunction 06/13/2013   HLD (hyperlipidemia) 06/13/2013   Essential hypertension 06/03/2013   Bradycardia by electrocardiogram 06/03/2013   Obstructive sleep apnea 03/12/2013   Leta Speller, MS, OTR/L  Darleene Cleaver, OT 06/21/2021, 1:20 PM   [facility] MAIN [facility] SERVICES [address] [place], [place], [address] Phone: [phone]   Fax:  [phone]  Name: SEBASTYAN SNODGRASS MRN: [id number] Date of Birth: Jan 19, 1951

## 2021-06-22 ENCOUNTER — Ambulatory Visit (INDEPENDENT_AMBULATORY_CARE_PROVIDER_SITE_OTHER): Payer: Medicare PPO | Admitting: Psychology

## 2021-06-22 DIAGNOSIS — F4323 Adjustment disorder with mixed anxiety and depressed mood: Secondary | ICD-10-CM | POA: Diagnosis not present

## 2021-06-22 DIAGNOSIS — F331 Major depressive disorder, recurrent, moderate: Secondary | ICD-10-CM | POA: Diagnosis not present

## 2021-06-22 NOTE — Progress Notes (Signed)
[place] Counselor/Therapist Progress Note ? ?Patient ID: NAFTULI DALSANTO, MRN: [id number],   ? ?Date: 06/22/2021 ? ?Time Spent: 45 minutes ? ?Treatment Type: Individual Therapy ? ?Reported Symptoms: sadness, and boredom ? ?Mental Status Exam: ?Appearance:  Casual     ?Behavior: Drowsy  ?Motor: Psychomotor Retardation  ?Speech/Language:  Slow  ?Affect: Blunt  ?Mood: normal  ?Thought process: concrete  ?Thought content:   WNL  ?Sensory/Perceptual disturbances:   WNL  ?Orientation: oriented to person, place, time/date, and situation  ?Attention: Fair  ?Concentration: Fair  ?Memory: Immediate;   Fair  ?Fund of knowledge:  Fair  ?Insight:   Fair  ?Judgment:  Fair  ?Impulse Control: Fair  ? ?Risk Assessment: ?Danger to Self:  No ?Self-injurious Behavior: No ?Danger to Others: No ?Duty to Warn:no ?Physical Aggression / Violence:No  ?Access to Firearms a concern: No  ?Gang Involvement:No  ? ?Subjective: The patient attended a face-to-face individual therapy session via video visit today.  The patient gave verbal consent for the video to be on WebEx.  The patient was in his home alone and therapist was in the office.  The patient presents with a flat affect and mood is pleasant.  The patient reports that he is doing okay.  He states that he does think that he is depressed.  We talked about what caused his depression.  The patient says that he does feel more depressed because he cannot do what he used to do and that his Parkinson's has changed his life significantly.  We discussed ways to change his negative thoughts to something more neutral or positive.   ? ? Interventions: Ego-Supportive, CBT ? ?Diagnosis:Major depressive disorder, recurrent episode, moderate ([place]) ? ?Adjustment disorder with mixed anxiety and depressed mood ? ?Plan: Please see Treatment plan in Therapy Charts with target date of 04/30/2022 for goals and progress towards goals.  Plan is to continue to offer supportive therapy to patient to  help him deal with his depression related to his Parkinsons diagnosis. ? ?Patient approved Treatment Plan in Therapy Charts. ? ?Cyanne Delmar G Isla Sabree, LCSW ? ? ? ? ? ? ? ? ? ? ? ? ? ? ? ? ? ?Joycie Aerts G Lorenna Lurry, LCSW ? ? ? ? ? ? ? ? ? ? ? ? ? ? ?Abbigaile Rockman G Shandrika Ambers, LCSW ? ? ? ? ? ? ? ? ? ? ? ? ? ? ?Alicea Wente G Zahriyah Joo, LCSW ?

## 2021-06-23 ENCOUNTER — Encounter: Payer: Medicare PPO | Admitting: Speech Pathology

## 2021-06-23 ENCOUNTER — Encounter: Payer: Self-pay | Admitting: Physical Therapy

## 2021-06-23 ENCOUNTER — Other Ambulatory Visit: Payer: Self-pay

## 2021-06-23 ENCOUNTER — Ambulatory Visit: Payer: Medicare PPO

## 2021-06-23 ENCOUNTER — Ambulatory Visit: Payer: Medicare PPO | Admitting: Physical Therapy

## 2021-06-23 DIAGNOSIS — R296 Repeated falls: Secondary | ICD-10-CM

## 2021-06-23 DIAGNOSIS — G20 Parkinson's disease: Secondary | ICD-10-CM

## 2021-06-23 DIAGNOSIS — R2681 Unsteadiness on feet: Secondary | ICD-10-CM

## 2021-06-23 DIAGNOSIS — R278 Other lack of coordination: Secondary | ICD-10-CM

## 2021-06-23 DIAGNOSIS — M6281 Muscle weakness (generalized): Secondary | ICD-10-CM | POA: Diagnosis not present

## 2021-06-23 DIAGNOSIS — R269 Unspecified abnormalities of gait and mobility: Secondary | ICD-10-CM | POA: Diagnosis not present

## 2021-06-23 DIAGNOSIS — R262 Difficulty in walking, not elsewhere classified: Secondary | ICD-10-CM

## 2021-06-23 NOTE — Therapy (Signed)
[place] MAIN [facility] SERVICES [address] [place], [place], [address] Phone: [phone]   Fax:  [phone]  Occupational Therapy Treatment  Patient Details  Name: [name] MRN: [id number] Date of Birth: [birth year] Referring Provider (OT): Dr. Sharolyn Douglas (neurologist at [facility])   Encounter Date: 06/23/2021   OT End of Session - 06/23/21 1641     Visit Number 11    Number of Visits 24    Date for OT Re-Evaluation 08/03/21    OT Start Time 7867    OT Stop Time 1230    OT Time Calculation (min) 45 min    Equipment Utilized During Treatment rollator    Activity Tolerance Patient tolerated treatment well    Behavior During Therapy [facility] for tasks assessed/performed             Past Medical History:  Diagnosis Date   Arthritis    Bradycardia    Cancer ([place]) 2013   skin cancer   Depression    ptsd   Dysrhythmia    chronic slow heart rate   GERD (gastroesophageal reflux disease)    Headache(784.0)    tension headaches non recent   History of chicken pox    History of kidney stones    passed   Hypertension    treated with HCTZ   Pacemaker    Parkinson's disease ([place])    dx'ed 15 years ago   PTSD (post-traumatic stress disorder)    Shortness of breath dyspnea    Sleep apnea    doesn't use C-pap   Varicose veins     Past Surgical History:  Procedure Laterality Date   CHOLECYSTECTOMY N/A 10/22/2014   Procedure: LAPAROSCOPIC CHOLECYSTECTOMY WITH INTRAOPERATIVE CHOLANGIOGRAM;  Surgeon: Dia Crawford III, MD;  Location: [facility] ORS;  Service: General;  Laterality: N/A;   cyst removed      from lip as a child   INTRAMEDULLARY (IM) NAIL INTERTROCHANTERIC N/A 02/18/2019   Procedure: INTRAMEDULLARY (IM) NAIL INTERTROCHANTRIC, RIGHT,;  Surgeon: Thornton Park, MD;  Location: [facility] ORS;  Service: Orthopedics;  Laterality: N/A;   LUMBAR LAMINECTOMY/DECOMPRESSION MICRODISCECTOMY Bilateral 12/14/2012   Procedure: Bilateral lumbar  three-four, four-five decompressive laminotomy/foraminotomy;  Surgeon: Charlie Pitter, MD;  Location: [place] NEURO ORS;  Service: Neurosurgery;  Laterality: Bilateral;   PULSE GENERATOR IMPLANT Bilateral 12/13/2013   Procedure: Bilateral implantable pulse generator placement;  Surgeon: Erline Levine, MD;  Location: [place] NEURO ORS;  Service: Neurosurgery;  Laterality: Bilateral;  Bilateral implantable pulse generator placement   skin cancer removed     from ears,   12 lft arm  rt leg 15   SUBTHALAMIC STIMULATOR BATTERY REPLACEMENT Bilateral 07/14/2017   Procedure: BILATERAL IMPLANTED PULSE GENERATOR CHANGE FOR DEEP BRAIN STIMULATOR;  Surgeon: Erline Levine, MD;  Location: [place];  Service: Neurosurgery;  Laterality: Bilateral;   SUBTHALAMIC STIMULATOR INSERTION Bilateral 12/06/2013   Procedure: SUBTHALAMIC STIMULATOR INSERTION;  Surgeon: Erline Levine, MD;  Location: [place] NEURO ORS;  Service: Neurosurgery;  Laterality: Bilateral;  Bilateral deep brain stimulator placement    There were no vitals filed for this visit.   Subjective Assessment - 06/23/21 1639     Subjective  "He's been trying to put his coat on standing up because it's so heavy, but he doens't always wait for someone to be near him for his balance." (per spouse)    Patient is accompanied by: Family member    Pertinent History Parkinson's Disease Diagnosed in 2000, frequent falls    Limitations  UE/LE tremors, decreased balance, lack of coordination, intermittent diplopia (has prism glasses)    Patient Stated Goals Pt/spouse verbalize desire to improve [facility] skills for holding playing cards, and improving shoulder flexibility and posture for better walker use.    Currently in Pain? Yes    Pain Score 4     Pain Location Back    Pain Orientation Lower    Pain Descriptors / Indicators Aching    Pain Type Chronic pain    Pain Radiating Towards low back/L hip    Pain Onset More than a month ago    Pain Frequency Intermittent    Aggravating Factors   walking, transfers    Pain Relieving Factors rest, heat, pain meds    Effect of Pain on Daily Activities difficulty with ADLs and walking    Multiple Pain Sites No    Pain Onset More than a month ago            Occupational Therapy Treatment: Self Care: Practiced initiating text message with min vc to set up correct screen.  Pt was indep to switch text to a new contact to begin a new conversation, requiring extra time to formulate a short sentence.  Practiced functional reaching laterally R/L and forward reach for cones with focus on walker safety, locking rollator brakes and keeping feet within back two legs of walker.  Pt required intermittent cues to position self close to cones when reaching and 1 cue to keep feet within walker.  Intermittent min guard to maintain balance with a directional change or when stepping back.  Practiced 360 degree turns R/L x3 each, with focus on turning in place and keeping feet within back 2 legs of walker to increase safety.  Overall consistent to keep feet within back 2 legs of walker which helped to keep pt with good upright posture during functional mobility.  Pt will continue to benefit from skilled OT for reinforcing walker safety during daily tasks, increasing postural stability, and increasing indep with short text messages for efficient communication with family/friends.      OT Education - 06/23/21 1640     Education Details walker safety with functional reaching and directional changes/360 turns.    Person(s) Educated Patient;Spouse    Methods Explanation;Verbal cues;Demonstration    Comprehension Verbalized understanding;Returned demonstration;Verbal cues required;Need further instruction              OT Short Term Goals - 06/21/21 1143       OT SHORT TERM GOAL #1   Title Pt/caregiver will be indep with HEP    Baseline Eval: not yet initiated; Pt has been consistent to send text messages as part of his home activity program/ issued  theraputty today with instruction in coordination exercises, further training needed    Time 6    Period Weeks    Status Partially Met    Target Date 06/22/21      OT SHORT TERM GOAL #2   Title --    Baseline Eval: R 41 sec, L 41 sec; 06/21/21: R 41 sec, L 34 sec    Time --    Status On-going    Target Date --               OT Long Term Goals - 06/21/21 1150       OT LONG TERM GOAL #1   Title Pt will improve FOTO score to 40 or better to indicate increased functional improvement.    Baseline Eval: FOTO  34; 06/21/21: FOTO 40    Time 12    Period Weeks    Status On-going    Target Date 08/03/21      OT LONG TERM GOAL #2   Title Pt will improve R lateral pinch strength to be able to tolerate holding playing cards in R dominant hand for game of cards.    Baseline Eval: R lateral pinch 16 lbs; pt enjoys playing cards but is not currently playing d/t decreased strength/coordination; 06/21/21: R 25, L 19 (pt also purchased card holder to compensate for "off" days or times)    Time 12    Period Weeks    Status On-going    Target Date 08/03/21      OT LONG TERM GOAL #3   Title Pt will improve R dominant hand to mouth coordination to self feed with minimal spilling and utilize adapted utensils as needed.    Baseline Eval: Spouse reports eating is messy and pt struggles to keep utensil level; 06/21/21: Pt uses long handled teaspoon; OT encouraged applying foam to build up handle as pt reports it's becoming more difficult to grip.  Pt reports stuggle with hand to mouth coordination and continues to spill.    Time 12    Period Weeks    Status On-going    Target Date 08/03/21      OT LONG TERM GOAL #4   Title Pt will improve bilat shoulder flex/abd for improved overhead reach during self care.    Baseline Eval: R shoulder flex 110, L 120, R abd 125, L 165; R shoulder flex; 06/21/21: R flex 115, L 143; R abd 144, L 170    Time 12    Period Weeks    Status On-going    Target Date 08/03/21       OT LONG TERM GOAL #5   Title Pt will improve FMC in bilat hands as noted by improvement in 9 hole peg test score by 5 or more seconds to enable participate in family board games.    Baseline Eval: R/L 9 hole peg test 41 secs (pt has not recently participated in board games); 06/21/21: R 41 sec, L 34 sec    Time 12    Period Weeks    Status Partially Met    Target Date 08/03/21      OT LONG TERM GOAL #6   Title Pt will be able to initiate simple texts and reply to texts on phone with min vc.    Baseline Eval: Pt struggles with texting; 06/21/21: mod vc to initiate a simple text, min-mod vc to respond    Time 12    Period Weeks    Status On-going    Target Date 08/03/21      OT LONG TERM GOAL #7   Title Pt will improve posture for ADLs and functional mobility as demonstrated by ability to keep feet within back two legs of rollator 50% of the time with min vc.    Baseline Eval: pt with forward lean and feet are behind back 2 legs of walker during functional mobility; 06/21/21: pt keeps feet within back 2 legs of walker 25% of the time with max vc.    Time 12    Status On-going    Target Date 08/03/21              Plan - 06/23/21 1658     Clinical Impression Statement Practiced initiating text message with min vc to set up  correct screen.  Pt was indep to switch text to a new contact to begin a new conversation, requiring extra time to formulate a short sentence.  Practiced functional reaching laterally R/L and forward reach for cones with focus on walker safety, locking rollator brakes and keeping feet within back two legs of walker.  Pt required intermittent cues to position self close to cones when reaching and 1 cue to keep feet within walker.  Intermittent min guard to maintain balance with a directional change or when stepping back.  Practiced 360 degree turns R/L x3 each, with focus on turning in place and keeping feet within back 2 legs of walker to increase safety.  Overall  consistent to keep feet within back 2 legs of walker which helped to keep pt with good upright posture during functional mobility.  Pt will continue to benefit from skilled OT for reinforcing walker safety during daily tasks, increasing postural stability, and increasing indep with short text messages for efficient communication with family/friends.    OT Occupational Profile and History Problem Focused Assessment - Including review of records relating to presenting problem    Occupational performance deficits (Please refer to evaluation for details): ADL's;IADL's;Leisure    Body Structure / Function / Physical Skills ADL;Coordination;GMC;UE functional use;Balance;Body mechanics;Decreased knowledge of use of DME;Flexibility;Dexterity;FMC;Strength;ROM    Cognitive Skills Attention;Memory    Rehab Potential Good    Clinical Decision Making Several treatment options, min-mod task modification necessary    Comorbidities Affecting Occupational Performance: May have comorbidities impacting occupational performance    Modification or Assistance to Complete Evaluation  No modification of tasks or assist necessary to complete eval    OT Frequency 2x / week    OT Duration 12 weeks    OT Treatment/Interventions Self-care/ADL training;Therapeutic exercise;DME and/or AE instruction;Functional Mobility Training;Cognitive remediation/compensation;Balance training;Neuromuscular education;Manual Therapy;Moist Heat;Energy conservation;Passive range of motion;Therapeutic activities;Patient/family education    OT Home Exercise Plan not yet initiated at eval    Consulted and Agree with Plan of Care Patient;Family member/caregiver    Family Member Consulted spouse, [name]             Patient will benefit from skilled therapeutic intervention in order to improve the following deficits and impairments:   Body Structure / Function / Physical Skills: ADL, Coordination, GMC, UE functional use, Balance, Body mechanics,  Decreased knowledge of use of DME, Flexibility, Dexterity, FMC, Strength, ROM Cognitive Skills: Attention, Memory     Visit Diagnosis: Muscle weakness (generalized)  Other lack of coordination  Parkinson's disease ([place])    Problem List Patient Active Problem List   Diagnosis Date Noted   Sinus drainage 12/09/2020   Medicare annual wellness visit, subsequent 11/18/2020   Pacemaker    Dementia associated with Parkinson's disease ([place]) 06/17/2020   Vertigo 02/19/2020   Urine abnormality 02/19/2020   Dysphagia 02/23/2019   Fall at home 10/18/2017   REM behavioral disorder 11/02/2016   Radicular pain in right arm 10/09/2016   GERD (gastroesophageal reflux disease) 07/31/2016   Colon cancer screening 07/23/2015   Gout 02/18/2015   Depression 01/06/2015   Skin lesion 10/02/2014   Dupuytren's contracture 08/20/2014   Cough 07/21/2014   Lumbar stenosis with neurogenic claudication 06/13/2014   S/P deep brain stimulator placement 05/08/2014   Advance care planning 01/19/2014   Parkinson's disease ([place]) 12/13/2013   PTSD (post-traumatic stress disorder) 06/13/2013   Erectile dysfunction 06/13/2013   HLD (hyperlipidemia) 06/13/2013   Essential hypertension 06/03/2013   Bradycardia by electrocardiogram 06/03/2013   Obstructive  sleep apnea 03/12/2013   Leta Speller, MS, OTR/L  Darleene Cleaver, OT 06/23/2021, 4:59 PM  [place] MAIN [facility] SERVICES [address] [place], [place], [address] Phone: [phone]   Fax:  [phone]  Name: [name] MRN: [id number] Date of Birth: 1950-10-01

## 2021-06-23 NOTE — Therapy (Signed)
[place] MAIN [facility] SERVICES [address] [place], [place], [address] Phone: [phone]   Fax:  [phone]  Physical Therapy Treatment  Patient Details  Name: [name] MRN: [id number] Date of Birth: 1950/10/14 Referring Provider (PT): Elsie Stain, MD   Encounter Date: 06/23/2021   PT End of Session - 06/23/21 1050     Visit Number 46    Number of Visits 63    Date for PT Re-Evaluation 08/23/21    Authorization Type [employer] Choice PPO    Authorization Time Period recert for 8/14-48/18    Progress Note Due on Visit 85    PT Start Time 1058    PT Stop Time 1143    PT Time Calculation (min) 45 min    Equipment Utilized During Treatment Gait belt    Activity Tolerance Patient tolerated treatment well    Behavior During Therapy WFL for tasks assessed/performed             Past Medical History:  Diagnosis Date   Arthritis    Bradycardia    Cancer ([place]) 2013   skin cancer   Depression    ptsd   Dysrhythmia    chronic slow heart rate   GERD (gastroesophageal reflux disease)    Headache(784.0)    tension headaches non recent   History of chicken pox    History of kidney stones    passed   Hypertension    treated with HCTZ   Pacemaker    Parkinson's disease ([place])    dx'ed 15 years ago   PTSD (post-traumatic stress disorder)    Shortness of breath dyspnea    Sleep apnea    doesn't use C-pap   Varicose veins     Past Surgical History:  Procedure Laterality Date   CHOLECYSTECTOMY N/A 10/22/2014   Procedure: LAPAROSCOPIC CHOLECYSTECTOMY WITH INTRAOPERATIVE CHOLANGIOGRAM;  Surgeon: Dia Crawford III, MD;  Location: [facility] ORS;  Service: General;  Laterality: N/A;   cyst removed      from lip as a child   INTRAMEDULLARY (IM) NAIL INTERTROCHANTERIC N/A 02/18/2019   Procedure: INTRAMEDULLARY (IM) NAIL INTERTROCHANTRIC, RIGHT,;  Surgeon: Thornton Park, MD;  Location: [facility] ORS;  Service: Orthopedics;  Laterality: N/A;    LUMBAR LAMINECTOMY/DECOMPRESSION MICRODISCECTOMY Bilateral 12/14/2012   Procedure: Bilateral lumbar three-four, four-five decompressive laminotomy/foraminotomy;  Surgeon: Charlie Pitter, MD;  Location: [place] [facility] OR[facility];  Service: Neurosurgery;  Laterality: Bilateral;   PULSE GENERATOR IMPLANT Bilateral 12/13/2013   Procedure: Bilateral implantable pulse generator placement;  Surgeon: Erline Levine, MD;  Location: [place] [facility] OR[facility];  Service: Neurosurgery;  Laterality: Bilateral;  Bilateral implantable pulse generator placement   skin cancer removed     from ears,   12 lft arm  rt leg 15   SUBTHALAMIC STIMULATOR BATTERY REPLACEMENT Bilateral 07/14/2017   Procedure: BILATERAL IMPLANTED PULSE GENERATOR CHANGE FOR DEEP BRAIN STIMULATOR;  Surgeon: Erline Levine, MD;  Location: [place];  Service: Neurosurgery;  Laterality: Bilateral;   SUBTHALAMIC STIMULATOR INSERTION Bilateral 12/06/2013   Procedure: SUBTHALAMIC STIMULATOR INSERTION;  Surgeon: Erline Levine, MD;  Location: [place] [facility] ORS;  Service: Neurosurgery;  Laterality: Bilateral;  Bilateral deep brain stimulator placement    There were no vitals filed for this visit.   Subjective Assessment - 06/23/21 1050     Subjective Pt reports improved s/s of back pain this date, still waiting for injection for pain relief from orthopedic doctor. No falls since last session, wife/caregiver reports continued difficulty wiht up walker  correct utilizaiton.    Patient is accompained by: Family member    Pertinent History [name] is a 28yoM who comes to [facility] neuro for evaluation of conitnued difficulty of imbalance, unsteadiness on feet, and generally limited tolerance to mobility in the setting of chronic parkinsonism. SInce DC in February, pt has had PPM placement due to bradycardia issues. Pt also started using an UpWalker device 2 week prior to this evaluation, goal was to help with gradual melting of postural extension that occured with sustained upright actiivty. Pt  has continued to practice supervised AMB with caregivers, short distances out of house and in community. Pt continues to experience frequent falls on a weekly basis, at a rate that both he and wife report to be increasing in frequency. PMH: HTN, bradycardia s/p PPM, PD, DBS, COVID-19 infection, chronic low back pain s/p L4/5 surgery.    Limitations Walking;Standing;Sitting;House hold activities;Lifting    How long can you sit comfortably? Not limited    How long can you stand comfortably? 5 minutes with support    How long can you walk comfortably? ~5-6 minutes UpWalker Walker    Patient Stated Goals Reduce falls, improve AMB tolerance    Pain Onset More than a month ago             Exercise/Activity Sets/Reps/Time/ Resistance Assistance Charge type Comments- Unless otherwise stated, CGA was provided and gait belt donned in order to ensure pt safety         Hip abduction in standing  2 x 10 ea LE with 5# AW donned   Therex  External cues for aiming of LE to ensure proper muscle activation, prior to cues pt performing hip flexion combines with hip abduction movement pattern.  -fatogue noted at end of second rep and pt form deteriorated. Good form prior.   Stair taps to 6 inch step with TB in between feet for external cue to prevent NBOS  2 x 10 x ea LE CGA Gait training  Cues for large amplitude movement, " buzzer" when pt hit green TB for knowledge of performance throughout.   SLS progression- 1 LE on airex, contra on 6 in step plus airex 2 x 45 sec ea   Therex Cues for maintaining knee extension and posture, particularly with R LE posterior/as primary stance LE. Difficulty maintaining TKE on the R > L, improved efficacy with maintenance of posture this session with less need for cues   LAQ 2 x 10 x 5# on ea LE   There ex To improve knee extension strength  External cues for full knee extension ROM.   STS on airex pad  1 x 10 standard  CGA, hands on knees intermittently  There ACT 1 LOB  posterior and corrected via PT , c/o increased LBP and exercise was discontinued this date as a result   Squat with 1 LE on BOSU  2 x 10 ea LE on airex  CGA  There ex Cues for squat depth         PRW! Step with UE assist to airex pad ( external cue for step length)  2 x 10  U UE, CGA  Therex  Pad for increased balance challenge as well as for external cue for step length. Cues for proper UE movement.   PWR! Up standing  2 x 10  U UE  There ex  Visual and verbal cues throughout, improved performance when mirroring PT   Gait training with external cues for  gait  X 100 feet  Up walker  Gait training TB hanging from up walker to improve step width with gait as well as for cues for proximity to up walker   Treatment Provided this session   Pt educated throughout session about proper posture and technique with exercises. Improved exercise technique, movement at target joints, use of target muscles after min to mod verbal, visual, tactile cues.  Note: Portions of this document were prepared using Dragon voice recognition software and although reviewed may contain unintentional dictation errors in syntax, grammar, or spelling.                             PT Education - 06/23/21 1108     Education provided Yes    Education Details exercise form and technique    Person(s) Educated Patient    Methods Explanation    Comprehension Verbalized understanding              PT Short Term Goals - 05/31/21 1134       PT SHORT TERM GOAL #1   Title Pt to demonstrate improved sustained AMB tolerance >826f.    Baseline At evlauation: 6026ftolerance (not to failure); 9/26: 1384 ft    Time 4    Period Weeks    Status Achieved    Target Date 01/05/21      PT SHORT TERM GOAL #2   Title Pt will improve BERG balance score to greater than 45 to indicate decrease risk of falls.    Baseline 2/13:44    Time 4    Period Weeks    Status On-going    Target Date 06/28/21      PT  SHORT TERM GOAL #3   Title Pt to demonstrate 10MWT<13sec to show self-selected gait speed appropriate for AMB outside of house and to improve energy efficiency.    Baseline UpWalker Eval: 17.53sec; 9/26: 0.74 m/s (13.5)11/21: 13.68 (.73 m/s) 12.64 on 12/28 9.88 pn2/13    Time 4    Period Weeks    Status Achieved    Target Date 02/08/21               PT Long Term Goals - 05/31/21 1136       PT LONG TERM GOAL #1   Title Pt will improve BERG balance score to greater than 47 to indicate decreaed risk of falls.    Baseline 41 on 12/28 44 on 2/13    Time 8    Period Weeks    Status On-going    Target Date 07/26/21      PT LONG TERM GOAL #2   Title Pt to tolerate 100039fustained AMB with UpWalker without rest interval to improve safety and independence in limited community AMB.    Baseline At eval; tired after 600f35f/26: 1384 ft    Time 8    Period Weeks    Status Achieved    Target Date 05/03/21      PT LONG TERM GOAL #3   Title Pt to demonstrate improved FOTO score by 5 points compared to initital assessment.    Baseline 9/26: 49 (eval was 50) 911/21: 52 12/28 deferred as wife not present and pt not best historian 2/13: 43    Time 8    Period Weeks    Status On-going    Target Date 07/26/21      PT LONG TERM GOAL #4   Title  Pt to show tolerance of AMB>1326f with UpWalker to improve tolerance to accessing the community for IADL.    Baseline Eval: 6033f  9/26: 1385    Time 12    Period Weeks    Status Achieved    Target Date --      PT LONG TERM GOAL #5   Title Patient will increase six minute walk test distance to >1000 for progression to community ambulator and improve gait ability    Baseline 9/26: new 11/21: 850 feet with up Walker 12/28: 925 feet with up walker, 980 feet with up walker    Time 8    Period Weeks    Status On-going    Target Date 07/26/21      PT LONG TERM GOAL #6   Title Patient will experience 1 fall or less per week for at least 3 weeks  in order to improve his frequency of falls and prevent injury from falls    Baseline On average multiple falls per week.    Time 12    Period Weeks    Status New    Target Date 08/23/21                   Plan - 06/23/21 1051     Clinical Impression Statement Patient presents to physical therapy with good motivation for completion of physical therapy program.  Patient had minimal complaints of low back pain today but did complain of low back pain with sit to stand exercises that was ceased upon cessation of exercise.  Patient did not have pain with power up in standing position involving a mini squat type movement so this exercise was substituted in place of further sit to stands.  Patient made good progress with single-leg stance progressions improving his right hip stability and strength patient also made progress with ambulation with better proximity to up walker and with improved step width.  Patient will continue to benefit from skilled physical therapy intervention in order to improve lower extremity strength, balance, reduce falls, improve his overall safety and quality of life.    Personal Factors and Comorbidities Age;Past/Current Experience;Time since onset of injury/illness/exacerbation;Transportation    Comorbidities Chronic back pain, orthostatic hypotension, repeated falls, parkinsonism    Examination-Activity Limitations Bathing;Bed Mobility;Caring for Others;Bend;Dressing;Lift;Locomotion Level;Reach Overhead;Sit;Squat;Stairs;Stand;Toileting;Transfers;Carry    Examination-Participation Restrictions Cleaning;Community Activity;Interpersonal Relationship;Shop;Volunteer;Yard Work;Other;Driving    Stability/Clinical Decision Making Evolving/Moderate complexity    Rehab Potential Fair    PT Frequency 2x / week    PT Duration 12 weeks    PT Treatment/Interventions ADLs/Self Care Home Management;Aquatic Therapy;Cryotherapy;Electrical Stimulation;Iontophoresis '4mg'$ /ml  Dexamethasone;Moist Heat;Ultrasound;Contrast Bath;DME Instruction;Gait training;Stair training;Functional mobility training;Therapeutic activities;Therapeutic exercise;Balance training;Neuromuscular re-education;Patient/family education;Manual techniques;Wheelchair mobility training;Energy conservation;Passive range of motion;Traction;Orthotic Fit/Training;Dry needling;Vestibular;Visual/perceptual remediation/compensation;Taping;Canalith Repostioning;Cognitive remediation    PT Next Visit Plan Progress static and dynamic balance and continue with caregiver/patient ed with low back stretching    PT Home Exercise Plan None set up at evaluation, still working on previous HEP from 6 months prior (STS, daily walking); 8/29: issued STS with focus on large amplitude movement at support surface; 9/12: PT provides handout with sit to stand exercise and large amplitude seated trunk rotation exercise.  9/19: reinforced HEP, provided handout for seated postural exercise with UE abduction, and seated exercise with trunk twists and UE reach, 10x for each, can be performed daily or minimum of 4x/week. No changes today.    Consulted and Agree with Plan of Care Patient;Family member/caregiver    Family Member Consulted Wife  Patient will benefit from skilled therapeutic intervention in order to improve the following deficits and impairments:  Abnormal gait, Decreased balance, Decreased coordination, Decreased mobility, Postural dysfunction, Decreased strength, Decreased safety awareness, Improper body mechanics, Impaired flexibility, Decreased activity tolerance, Decreased endurance, Decreased knowledge of precautions, Difficulty walking, Pain, Cardiopulmonary status limiting activity, Decreased range of motion, Impaired perceived functional ability, Decreased cognition, Dizziness  Visit Diagnosis: Repeated falls  Unsteadiness on feet  Difficulty in walking, not elsewhere classified  Abnormality of  gait and mobility     Problem List Patient Active Problem List   Diagnosis Date Noted   Sinus drainage 12/09/2020   Medicare annual wellness visit, subsequent 11/18/2020   Pacemaker    Dementia associated with Parkinson's disease ([place]) 06/17/2020   Vertigo 02/19/2020   Urine abnormality 02/19/2020   Dysphagia 02/23/2019   Fall at home 10/18/2017   REM behavioral disorder 11/02/2016   Radicular pain in right arm 10/09/2016   GERD (gastroesophageal reflux disease) 07/31/2016   Colon cancer screening 07/23/2015   Gout 02/18/2015   Depression 01/06/2015   Skin lesion 10/02/2014   Dupuytren's contracture 08/20/2014   Cough 07/21/2014   Lumbar stenosis with neurogenic claudication 06/13/2014   S/P deep brain stimulator placement 05/08/2014   Advance care planning 01/19/2014   Parkinson's disease ([place]) 12/13/2013   PTSD (post-traumatic stress disorder) 06/13/2013   Erectile dysfunction 06/13/2013   HLD (hyperlipidemia) 06/13/2013   Essential hypertension 06/03/2013   Bradycardia by electrocardiogram 06/03/2013   Obstructive sleep apnea 03/12/2013    Particia Lather, PT 06/23/2021, 12:03 PM  [place] MAIN [facility] SERVICES [address] [place], [place], [address] Phone: [phone]   Fax:  [phone]  Name: [name] MRN: [id number] Date of Birth: [birth year]

## 2021-06-28 ENCOUNTER — Encounter: Payer: Medicare PPO | Admitting: Speech Pathology

## 2021-06-28 ENCOUNTER — Ambulatory Visit: Payer: Medicare PPO

## 2021-06-28 ENCOUNTER — Other Ambulatory Visit: Payer: Self-pay | Admitting: Family Medicine

## 2021-06-28 ENCOUNTER — Other Ambulatory Visit: Payer: Self-pay

## 2021-06-28 ENCOUNTER — Encounter: Payer: Self-pay | Admitting: Cardiology

## 2021-06-28 ENCOUNTER — Telehealth: Payer: Self-pay | Admitting: Cardiology

## 2021-06-28 ENCOUNTER — Ambulatory Visit: Payer: Medicare PPO | Admitting: Physical Therapy

## 2021-06-28 DIAGNOSIS — R278 Other lack of coordination: Secondary | ICD-10-CM | POA: Diagnosis not present

## 2021-06-28 DIAGNOSIS — R269 Unspecified abnormalities of gait and mobility: Secondary | ICD-10-CM

## 2021-06-28 DIAGNOSIS — G20 Parkinson's disease: Secondary | ICD-10-CM

## 2021-06-28 DIAGNOSIS — M6281 Muscle weakness (generalized): Secondary | ICD-10-CM

## 2021-06-28 DIAGNOSIS — R2681 Unsteadiness on feet: Secondary | ICD-10-CM | POA: Diagnosis not present

## 2021-06-28 DIAGNOSIS — R296 Repeated falls: Secondary | ICD-10-CM

## 2021-06-28 DIAGNOSIS — R262 Difficulty in walking, not elsewhere classified: Secondary | ICD-10-CM | POA: Diagnosis not present

## 2021-06-28 NOTE — Telephone Encounter (Signed)
? ?  Pre-operative Risk Assessment  ?  ?Patient Name: [name]  ?DOB: 08-21-1950 ?MRN: [id number]  ? ?  ? ?Request for Surgical Clearance   ? ?Procedure:   Lumbar Radiofrequency Ablation  ? ?Date of Surgery:  Clearance TBD                           ?   ?Surgeon:  Dr. Lanae Crumbly ?Surgeon's Group or Practice Name:  [facility]  ?Phone number:  [phone] ?Fax number:  [phone] ?  ?Type of Clearance Requested:   ?- Medical  DEVICE CLEARANCE ONLY PER OFFICE  ?  ?Type of Anesthesia:  Not Indicated ?  ?Additional requests/questions:   ANY SPECIAL INSTRUCTIONS IE: MAGNET USE  ? ?Signed, ?Clarisse Gouge   ?06/28/2021, 12:35 PM  ? ?

## 2021-06-28 NOTE — Therapy (Signed)
[place] MAIN [facility] SERVICES [address] [place], [place], [address] Phone: [phone]   Fax:  [phone]  Occupational Therapy Treatment  Patient Details  Name: [name] MRN: [id number] Date of Birth: [birth year] Referring Provider (OT): Dr. Sharolyn Douglas (neurologist at [facility])   Encounter Date: 06/28/2021   OT End of Session - 06/28/21 1258     Visit Number 12    Number of Visits 24    Date for OT Re-Evaluation 08/03/21    OT Start Time 1015    OT Stop Time 1100    OT Time Calculation (min) 45 min    Equipment Utilized During Treatment rollator    Activity Tolerance Patient tolerated treatment well    Behavior During Therapy [facility] for tasks assessed/performed             Past Medical History:  Diagnosis Date   Arthritis    Bradycardia    Cancer ([place]) 2013   skin cancer   Depression    ptsd   Dysrhythmia    chronic slow heart rate   GERD (gastroesophageal reflux disease)    Headache(784.0)    tension headaches non recent   History of chicken pox    History of kidney stones    passed   Hypertension    treated with HCTZ   Pacemaker    Parkinson's disease ([place])    dx'ed 15 years ago   PTSD (post-traumatic stress disorder)    Shortness of breath dyspnea    Sleep apnea    doesn't use C-pap   Varicose veins     Past Surgical History:  Procedure Laterality Date   CHOLECYSTECTOMY N/A 10/22/2014   Procedure: LAPAROSCOPIC CHOLECYSTECTOMY WITH INTRAOPERATIVE CHOLANGIOGRAM;  Surgeon: Dia Crawford III, MD;  Location: [facility] ORS;  Service: General;  Laterality: N/A;   cyst removed      from lip as a child   INTRAMEDULLARY (IM) NAIL INTERTROCHANTERIC N/A 02/18/2019   Procedure: INTRAMEDULLARY (IM) NAIL INTERTROCHANTRIC, RIGHT,;  Surgeon: Thornton Park, MD;  Location: [facility] ORS;  Service: Orthopedics;  Laterality: N/A;   LUMBAR LAMINECTOMY/DECOMPRESSION MICRODISCECTOMY Bilateral 12/14/2012   Procedure: Bilateral lumbar  three-four, four-five decompressive laminotomy/foraminotomy;  Surgeon: Charlie Pitter, MD;  Location: [place] NEURO ORS;  Service: Neurosurgery;  Laterality: Bilateral;   PULSE GENERATOR IMPLANT Bilateral 12/13/2013   Procedure: Bilateral implantable pulse generator placement;  Surgeon: Erline Levine, MD;  Location: [place] NEURO ORS;  Service: Neurosurgery;  Laterality: Bilateral;  Bilateral implantable pulse generator placement   skin cancer removed     from ears,   12 lft arm  rt leg 15   SUBTHALAMIC STIMULATOR BATTERY REPLACEMENT Bilateral 07/14/2017   Procedure: BILATERAL IMPLANTED PULSE GENERATOR CHANGE FOR DEEP BRAIN STIMULATOR;  Surgeon: Erline Levine, MD;  Location: [place];  Service: Neurosurgery;  Laterality: Bilateral;   SUBTHALAMIC STIMULATOR INSERTION Bilateral 12/06/2013   Procedure: SUBTHALAMIC STIMULATOR INSERTION;  Surgeon: Erline Levine, MD;  Location: [place] NEURO ORS;  Service: Neurosurgery;  Laterality: Bilateral;  Bilateral deep brain stimulator placement    There were no vitals filed for this visit.   Subjective Assessment - 06/28/21 1243     Subjective  "He brought his card shuffler and card holders today." (per spouse)    Patient is accompanied by: Family member    Pertinent History Parkinson's Disease Diagnosed in 2000, frequent falls    Limitations UE/LE tremors, decreased balance, lack of coordination, intermittent diplopia (has prism glasses)    Patient Stated Goals Pt/spouse  verbalize desire to improve [facility] skills for holding playing cards, and improving shoulder flexibility and posture for better walker use.    Currently in Pain? Yes    Pain Score 2     Pain Location Back    Pain Orientation Lower    Pain Descriptors / Indicators Aching    Pain Type Chronic pain    Pain Radiating Towards low back/L hip    Pain Onset More than a month ago    Pain Frequency Intermittent    Aggravating Factors  walking, transfers    Pain Relieving Factors rest, heat    Effect of Pain on Daily  Activities difficulty with ADLs and walking    Multiple Pain Sites No    Pain Onset More than a month ago            Occupational Therapy Treatment: Therapeutic Activity: Participation in card game with use of pt's new adaptive equipment including card shuffler and card holder which sits on table top.  Pt was able to operate card shuffler with initial min vc, and worked to place his own cards into Conservation officer, historic buildings.  Pt required initial cues for technique, and intermittent cues throughout card game to place/remove cards from holder with bilat hands, 1 to place the card, 1 to stabilize the holder.  2 cues for postural correction during card game to reduce slight lean to the R; able to correct with cue.   Therapeutic Exercise: Facilitated core strengthening with pt sitting edge of mat to complete 10 repetitions of clipping jumbo clothespins onto vertical dowel reaching R hand laterally to the R, and L hand laterally to the L.  Challenged trunk rotation having L hand reach across chest to the R, and vice versa with the R hand.  Challenged anterior/posterior leaning reaching for clothespins and placing into bucket.  Pt able to shift in all directions without UE support, feet supported, and good return to midline after each repetition (A/P shift most effortful).  2 vc for midline orientation during this task.   Response to Treatment: Pt tolerated dynamic sitting tasks well today with minimal back pain and only 2 cues for midline orientation during card task, and 2 cues during EOB reaching tasks.  Pt will continue to benefit from skilled OT for increasing core strength and sitting/standing balance in order to maximize indep and reduce fall risk with daily tasks.     OT Education - 06/28/21 1257     Education Details sitting balance    Person(s) Educated Patient;Spouse    Methods Explanation;Verbal cues;Demonstration    Comprehension Verbalized understanding;Returned demonstration;Verbal cues  required;Need further instruction              OT Short Term Goals - 06/21/21 1143       OT SHORT TERM GOAL #1   Title Pt/caregiver will be indep with HEP    Baseline Eval: not yet initiated; Pt has been consistent to send text messages as part of his home activity program/ issued theraputty today with instruction in coordination exercises, further training needed    Time 6    Period Weeks    Status Partially Met    Target Date 06/22/21      OT SHORT TERM GOAL #2   Title --    Baseline Eval: R 41 sec, L 41 sec; 06/21/21: R 41 sec, L 34 sec    Time --    Status On-going    Target Date --  OT Long Term Goals - 06/21/21 1150       OT LONG TERM GOAL #1   Title Pt will improve FOTO score to 40 or better to indicate increased functional improvement.    Baseline Eval: FOTO 34; 06/21/21: FOTO 40    Time 12    Period Weeks    Status On-going    Target Date 08/03/21      OT LONG TERM GOAL #2   Title Pt will improve R lateral pinch strength to be able to tolerate holding playing cards in R dominant hand for game of cards.    Baseline Eval: R lateral pinch 16 lbs; pt enjoys playing cards but is not currently playing d/t decreased strength/coordination; 06/21/21: R 25, L 19 (pt also purchased card holder to compensate for "off" days or times)    Time 12    Period Weeks    Status On-going    Target Date 08/03/21      OT LONG TERM GOAL #3   Title Pt will improve R dominant hand to mouth coordination to self feed with minimal spilling and utilize adapted utensils as needed.    Baseline Eval: Spouse reports eating is messy and pt struggles to keep utensil level; 06/21/21: Pt uses long handled teaspoon; OT encouraged applying foam to build up handle as pt reports it's becoming more difficult to grip.  Pt reports stuggle with hand to mouth coordination and continues to spill.    Time 12    Period Weeks    Status On-going    Target Date 08/03/21      OT LONG TERM GOAL #4    Title Pt will improve bilat shoulder flex/abd for improved overhead reach during self care.    Baseline Eval: R shoulder flex 110, L 120, R abd 125, L 165; R shoulder flex; 06/21/21: R flex 115, L 143; R abd 144, L 170    Time 12    Period Weeks    Status On-going    Target Date 08/03/21      OT LONG TERM GOAL #5   Title Pt will improve FMC in bilat hands as noted by improvement in 9 hole peg test score by 5 or more seconds to enable participate in family board games.    Baseline Eval: R/L 9 hole peg test 41 secs (pt has not recently participated in board games); 06/21/21: R 41 sec, L 34 sec    Time 12    Period Weeks    Status Partially Met    Target Date 08/03/21      OT LONG TERM GOAL #6   Title Pt will be able to initiate simple texts and reply to texts on phone with min vc.    Baseline Eval: Pt struggles with texting; 06/21/21: mod vc to initiate a simple text, min-mod vc to respond    Time 12    Period Weeks    Status On-going    Target Date 08/03/21      OT LONG TERM GOAL #7   Title Pt will improve posture for ADLs and functional mobility as demonstrated by ability to keep feet within back two legs of rollator 50% of the time with min vc.    Baseline Eval: pt with forward lean and feet are behind back 2 legs of walker during functional mobility; 06/21/21: pt keeps feet within back 2 legs of walker 25% of the time with max vc.    Time 12    Status  On-going    Target Date 08/03/21             Plan - 06/28/21 1313     Clinical Impression Statement Pt tolerated dynamic sitting tasks well today with minimal back pain and only 2 cues for midline orientation during card task, and 2 cues during EOB reaching tasks.  Pt will continue to benefit from skilled OT for increasing core strength and sitting/standing balance in order to maximize indep and reduce fall risk with daily tasks.    OT Occupational Profile and History Problem Focused Assessment - Including review of records relating  to presenting problem    Occupational performance deficits (Please refer to evaluation for details): ADL's;IADL's;Leisure    Body Structure / Function / Physical Skills ADL;Coordination;GMC;UE functional use;Balance;Body mechanics;Decreased knowledge of use of DME;Flexibility;Dexterity;FMC;Strength;ROM    Cognitive Skills Attention;Memory    Rehab Potential Good    Clinical Decision Making Several treatment options, min-mod task modification necessary    Comorbidities Affecting Occupational Performance: May have comorbidities impacting occupational performance    Modification or Assistance to Complete Evaluation  No modification of tasks or assist necessary to complete eval    OT Frequency 2x / week    OT Duration 12 weeks    OT Treatment/Interventions Self-care/ADL training;Therapeutic exercise;DME and/or AE instruction;Functional Mobility Training;Cognitive remediation/compensation;Balance training;Neuromuscular education;Manual Therapy;Moist Heat;Energy conservation;Passive range of motion;Therapeutic activities;Patient/family education    OT Home Exercise Plan not yet initiated at eval    Consulted and Agree with Plan of Care Patient;Family member/caregiver    Family Member Consulted spouse, [name]             Patient will benefit from skilled therapeutic intervention in order to improve the following deficits and impairments:   Body Structure / Function / Physical Skills: ADL, Coordination, GMC, UE functional use, Balance, Body mechanics, Decreased knowledge of use of DME, Flexibility, Dexterity, FMC, Strength, ROM Cognitive Skills: Attention, Memory     Visit Diagnosis: Muscle weakness (generalized)  Other lack of coordination  Parkinson's disease ([place])    Problem List Patient Active Problem List   Diagnosis Date Noted   Sinus drainage 12/09/2020   Medicare annual wellness visit, subsequent 11/18/2020   Pacemaker    Dementia associated with Parkinson's disease ([place])  06/17/2020   Vertigo 02/19/2020   Urine abnormality 02/19/2020   Dysphagia 02/23/2019   Fall at home 10/18/2017   REM behavioral disorder 11/02/2016   Radicular pain in right arm 10/09/2016   GERD (gastroesophageal reflux disease) 07/31/2016   Colon cancer screening 07/23/2015   Gout 02/18/2015   Depression 01/06/2015   Skin lesion 10/02/2014   Dupuytren's contracture 08/20/2014   Cough 07/21/2014   Lumbar stenosis with neurogenic claudication 06/13/2014   S/P deep brain stimulator placement 05/08/2014   Advance care planning 01/19/2014   Parkinson's disease ([place]) 12/13/2013   PTSD (post-traumatic stress disorder) 06/13/2013   Erectile dysfunction 06/13/2013   HLD (hyperlipidemia) 06/13/2013   Essential hypertension 06/03/2013   Bradycardia by electrocardiogram 06/03/2013   Obstructive sleep apnea 03/12/2013   Leta Speller, MS, OTR/L  Darleene Cleaver, OT 06/28/2021, 1:14 PM  [facility] MAIN [facility] SERVICES [address] [place], [place], [address] Phone: [phone]   Fax:  [phone]  Name: [name] MRN: [id number] Date of Birth: [birth year]

## 2021-06-28 NOTE — Progress Notes (Signed)
PERIOPERATIVE PRESCRIPTION FOR IMPLANTED CARDIAC DEVICE PROGRAMMING ? ?Patient Information: ?Name:  [name]  ?DOB:  1950-08-09  ?MRN:  [id number]  ?  ?Procedure:   Lumbar Radiofrequency Ablation  ?  ?Date of Surgery:  Clearance TBD                           ?   ?Surgeon:  Dr. Lanae Crumbly ?Surgeon's Group or Practice Name:  [facility]  ?Phone number:  [phone] ?Fax number:  [phone] ?  ?Type of Clearance Requested:   ?- Medical  DEVICE CLEARANCE ONLY PER OFFICE  ?  ?Type of Anesthesia:  Not Indicated ?Device Information: ? ?Clinic EP Physician:  Dr. Quentin Ore ? ?Device Type:  Pacemaker ?Manufacturer and Phone #:  Medtronic: [phone] ?Pacemaker Dependent?:  No. ?Date of Last Device Check:  05/24/2021 Remote Normal Device Function?:  Yes.   ? ?Electrophysiologist's Recommendations: ? ?Have magnet available. ?Provide continuous ECG monitoring when magnet is used or reprogramming is to be performed.  ?Procedure may interfere with device function.  Magnet should be placed over device during procedure. ? ?Per Device Clinic Standing Orders, ?Wanda Plump, RN  ?1:04 PM 06/28/2021  ?

## 2021-06-28 NOTE — Therapy (Signed)
[place] MAIN [facility] SERVICES [address] [place], [place], [address] Phone: [phone]   Fax:  [phone]  Physical Therapy Treatment  Patient Details  Name: [name] MRN: [id number] Date of Birth: 1951/03/10 Referring Provider (PT): Elsie Stain, MD   Encounter Date: 06/28/2021   PT End of Session - 06/28/21 1014     Visit Number 47    Number of Visits 31    Date for PT Re-Evaluation 08/23/21    Authorization Type [employer] Medicare Choice PPO    Progress Note Due on Visit 43    PT Start Time 1103    PT Stop Time 6301    PT Time Calculation (min) 41 min             Past Medical History:  Diagnosis Date   Arthritis    Bradycardia    Cancer ([place]) 2013   skin cancer   Depression    ptsd   Dysrhythmia    chronic slow heart rate   GERD (gastroesophageal reflux disease)    Headache(784.0)    tension headaches non recent   History of chicken pox    History of kidney stones    passed   Hypertension    treated with HCTZ   Pacemaker    Parkinson's disease ([place])    dx'ed 15 years ago   PTSD (post-traumatic stress disorder)    Shortness of breath dyspnea    Sleep apnea    doesn't use C-pap   Varicose veins     Past Surgical History:  Procedure Laterality Date   CHOLECYSTECTOMY N/A 10/22/2014   Procedure: LAPAROSCOPIC CHOLECYSTECTOMY WITH INTRAOPERATIVE CHOLANGIOGRAM;  Surgeon: Dia Crawford III, MD;  Location: [facility] ORS;  Service: General;  Laterality: N/A;   cyst removed      from lip as a child   INTRAMEDULLARY (IM) NAIL INTERTROCHANTERIC N/A 02/18/2019   Procedure: INTRAMEDULLARY (IM) NAIL INTERTROCHANTRIC, RIGHT,;  Surgeon: Thornton Park, MD;  Location: [facility] ORS;  Service: Orthopedics;  Laterality: N/A;   LUMBAR LAMINECTOMY/DECOMPRESSION MICRODISCECTOMY Bilateral 12/14/2012   Procedure: Bilateral lumbar three-four, four-five decompressive laminotomy/foraminotomy;  Surgeon: Charlie Pitter, MD;  Location: [place] NEURO ORS;   Service: Neurosurgery;  Laterality: Bilateral;   PULSE GENERATOR IMPLANT Bilateral 12/13/2013   Procedure: Bilateral implantable pulse generator placement;  Surgeon: Erline Levine, MD;  Location: [place] [facility]UR[facility] ORS;  Service: Neurosurgery;  Laterality: Bilateral;  Bilateral implantable pulse generator placement   skin cancer removed     from ears,   12 lft arm  rt leg 15   SUBTHALAMIC STIMULATOR BATTERY REPLACEMENT Bilateral 07/14/2017   Procedure: BILATERAL IMPLANTED PULSE GENERATOR CHANGE FOR DEEP BRAIN STIMULATOR;  Surgeon: Erline Levine, MD;  Location: [place];  Service: Neurosurgery;  Laterality: Bilateral;   SUBTHALAMIC STIMULATOR INSERTION Bilateral 12/06/2013   Procedure: SUBTHALAMIC STIMULATOR INSERTION;  Surgeon: Erline Levine, MD;  Location: [place] NEURO ORS;  Service: Neurosurgery;  Laterality: Bilateral;  Bilateral deep brain stimulator placement    There were no vitals filed for this visit.   Subjective Assessment - 06/28/21 1105     Subjective Pt reports his pain level in his back is 3-4 today. No falls or LOB since last session. [name] they avoided going out yesterday in the icey and wet conditions    Patient is accompained by: Family member    Pertinent History [name] is a 24yoM who comes to [place] neuro for evaluation of conitnued difficulty of imbalance, unsteadiness on feet, and  generally limited tolerance to mobility in the setting of chronic parkinsonism. SInce DC in February, pt has had PPM placement due to bradycardia issues. Pt also started using an UpWalker device 2 week prior to this evaluation, goal was to help with gradual melting of postural extension that occured with sustained upright actiivty. Pt has continued to practice supervised AMB with caregivers, short distances out of house and in community. Pt continues to experience frequent falls on a weekly basis, at a rate that both he and wife report to be increasing in frequency. PMH: HTN, bradycardia s/p PPM, PD,  DBS, COVID-19 infection, chronic low back pain s/p L4/5 surgery.    Limitations Walking;Standing;Sitting;House hold activities;Lifting    How long can you sit comfortably? Not limited    How long can you stand comfortably? 5 minutes with support    How long can you walk comfortably? ~5-6 minutes UpWalker Walker    Patient Stated Goals Reduce falls, improve AMB tolerance    Currently in Pain? Yes    Pain Score 4     Pain Location Back    Pain Orientation Lower    Pain Descriptors / Indicators Aching    Pain Type Chronic pain    Pain Onset More than a month ago    Aggravating Factors  walking, transfers    Pain Relieving Factors rest, heat               Exercise/Activity Sets/Reps/Time/ Resistance Assistance Charge type Comments- Unless otherwise stated, CGA was provided and gait belt donned in order to ensure pt safety         Hip abduction in standing  2 x 10 ea LE with 5# AW donned   Therex  External cues for aiming of LE to ensure proper muscle activation, prior to cues pt performing hip flexion combines with hip abduction movement pattern.  -fatogue noted at end of second rep and pt form deteriorated. Good form prior.   Stair taps to 6 inch step with TB in between feet for external cue to prevent NBOS  2 x 10 x ea LE CGA Gait training  Cues for large amplitude movement, " buzzer" when pt hit green TB for knowledge of performance throughout.   SLS progression- 1 LE on airex, contra on 6 in step therband pad  2 x 45 sec ea   Therex Cues for maintaining knee extension and posture, particularly with R LE posterior/as primary stance LE. Difficulty maintaining TKE on the R > L, improved efficacy with maintenance of posture this session with less need for cues   LAQ 2 x 10 x 5# on ea LE   There ex To improve knee extension strength  External cues for full knee extension ROM.   STS 1 x 10 standard  CGA, hands on knees intermittently  There ACT No LOB or pain this session.                PRW! Step with UE assist to airex pad ( external cue for step length)  2 x 10  U UE, CGA  Therex  Pad for increased balance challenge as well as for external cue for step length. Cues for proper UE movement.  - Missed pad and had trip on one attempt, able to correct with stepping pattern, CGA was provided for safety throughout  PWR! Up standing  1 x 10  U UE  There ex  Visual and verbal cues throughout, improved performance when mirroring PT  Gait training with external cues for gait  X 300  feet  Up walker  Gait training TB hanging from up walker to improve step width with gait as well as for cues for proximity to up walker   Treatment Provided this session   Pt educated throughout session about proper posture and technique with exercises. Improved exercise technique, movement at target joints, use of target muscles after min to mod verbal, visual, tactile cues.  Note: Portions of this document were prepared using Dragon voice recognition software and although reviewed may contain unintentional dictation errors in syntax, grammar, or spelling.                          PT Education - 06/28/21 1013     Education provided Yes    Education Details exercise form and technique    Person(s) Educated Patient    Methods Explanation    Comprehension Verbalized understanding              PT Short Term Goals - 05/31/21 1134       PT SHORT TERM GOAL #1   Title Pt to demonstrate improved sustained AMB tolerance >853f.    Baseline At evlauation: 6081ftolerance (not to failure); 9/26: 1384 ft    Time 4    Period Weeks    Status Achieved    Target Date 01/05/21      PT SHORT TERM GOAL #2   Title Pt will improve BERG balance score to greater than 45 to indicate decrease risk of falls.    Baseline 2/13:44    Time 4    Period Weeks    Status On-going    Target Date 06/28/21      PT SHORT TERM GOAL #3   Title Pt to demonstrate 10MWT<13sec to show self-selected gait  speed appropriate for AMB outside of house and to improve energy efficiency.    Baseline UpWalker Eval: 17.53sec; 9/26: 0.74 m/s (13.5)11/21: 13.68 (.73 m/s) 12.64 on 12/28 9.88 pn2/13    Time 4    Period Weeks    Status Achieved    Target Date 02/08/21               PT Long Term Goals - 05/31/21 1136       PT LONG TERM GOAL #1   Title Pt will improve BERG balance score to greater than 47 to indicate decreaed risk of falls.    Baseline 41 on 12/28 44 on 2/13    Time 8    Period Weeks    Status On-going    Target Date 07/26/21      PT LONG TERM GOAL #2   Title Pt to tolerate 100050fustained AMB with UpWalker without rest interval to improve safety and independence in limited community AMB.    Baseline At eval; tired after 600f72f/26: 1384 ft    Time 8    Period Weeks    Status Achieved    Target Date 05/03/21      PT LONG TERM GOAL #3   Title Pt to demonstrate improved FOTO score by 5 points compared to initital assessment.    Baseline 9/26: 49 (eval was 50) 911/21: 52 12/28 deferred as wife not present and pt not best historian 2/13: 43    Time 8    Period Weeks    Status On-going    Target Date 07/26/21      PT LONG TERM GOAL #  4   Title Pt to show tolerance of AMB>1377f with UpWalker to improve tolerance to accessing the community for IADL.    Baseline Eval: 6032f  9/26: 1385    Time 12    Period Weeks    Status Achieved    Target Date --      PT LONG TERM GOAL #5   Title Patient will increase six minute walk test distance to >1000 for progression to community ambulator and improve gait ability    Baseline 9/26: new 11/21: 850 feet with up Walker 12/28: 925 feet with up walker, 980 feet with up walker    Time 8    Period Weeks    Status On-going    Target Date 07/26/21      PT LONG TERM GOAL #6   Title Patient will experience 1 fall or less per week for at least 3 weeks in order to improve his frequency of falls and prevent injury from falls    Baseline  On average multiple falls per week.    Time 12    Period Weeks    Status New    Target Date 08/23/21                   Plan - 06/28/21 1014     Clinical Impression Statement Patient presents to physical therapy with good motivation for completion of physical therapy program. Patient made good progress with single-leg stance progressions improving his right hip stability and strength patient also made progress with ambulation with better proximity to up walker and with improved step width. Patient will continue to benefit from skilled physical therapy intervention in order to improve lower extremity strength, balance, reduce falls, improve his overall safety and quality of life.    Personal Factors and Comorbidities Age;Past/Current Experience;Time since onset of injury/illness/exacerbation;Transportation    Comorbidities Chronic back pain, orthostatic hypotension, repeated falls, parkinsonism    Examination-Activity Limitations Bathing;Bed Mobility;Caring for Others;Bend;Dressing;Lift;Locomotion Level;Reach Overhead;Sit;Squat;Stairs;Stand;Toileting;Transfers;Carry    Examination-Participation Restrictions Cleaning;Community Activity;Interpersonal Relationship;Shop;Volunteer;Yard Work;Other;Driving    Stability/Clinical Decision Making Evolving/Moderate complexity    Rehab Potential Fair    PT Frequency 2x / week    PT Duration 12 weeks    PT Treatment/Interventions ADLs/Self Care Home Management;Aquatic Therapy;Cryotherapy;Electrical Stimulation;Iontophoresis '4mg'$ /ml Dexamethasone;Moist Heat;Ultrasound;Contrast Bath;DME Instruction;Gait training;Stair training;Functional mobility training;Therapeutic activities;Therapeutic exercise;Balance training;Neuromuscular re-education;Patient/family education;Manual techniques;Wheelchair mobility training;Energy conservation;Passive range of motion;Traction;Orthotic Fit/Training;Dry needling;Vestibular;Visual/perceptual  remediation/compensation;Taping;Canalith Repostioning;Cognitive remediation    PT Next Visit Plan Progress static and dynamic balance and continue with caregiver/patient ed with low back stretching    PT Home Exercise Plan None set up at evaluation, still working on previous HEP from 6 months prior (STS, daily walking); 8/29: issued STS with focus on large amplitude movement at support surface; 9/12: PT provides handout with sit to stand exercise and large amplitude seated trunk rotation exercise.  9/19: reinforced HEP, provided handout for seated postural exercise with UE abduction, and seated exercise with trunk twists and UE reach, 10x for each, can be performed daily or minimum of 4x/week. No changes today.    Consulted and Agree with Plan of Care Patient;Family member/caregiver    Family Member Consulted Wife             Patient will benefit from skilled therapeutic intervention in order to improve the following deficits and impairments:  Abnormal gait, Decreased balance, Decreased coordination, Decreased mobility, Postural dysfunction, Decreased strength, Decreased safety awareness, Improper body mechanics, Impaired flexibility, Decreased activity tolerance, Decreased endurance, Decreased knowledge of precautions,  Difficulty walking, Pain, Cardiopulmonary status limiting activity, Decreased range of motion, Impaired perceived functional ability, Decreased cognition, Dizziness  Visit Diagnosis: Unsteadiness on feet  Repeated falls  Difficulty in walking, not elsewhere classified  Abnormality of gait and mobility     Problem List Patient Active Problem List   Diagnosis Date Noted   Sinus drainage 12/09/2020   Medicare annual wellness visit, subsequent 11/18/2020   Pacemaker    Dementia associated with Parkinson's disease ([place]) 06/17/2020   Vertigo 02/19/2020   Urine abnormality 02/19/2020   Dysphagia 02/23/2019   Fall at home 10/18/2017   REM behavioral disorder 11/02/2016    Radicular pain in right arm 10/09/2016   GERD (gastroesophageal reflux disease) 07/31/2016   Colon cancer screening 07/23/2015   Gout 02/18/2015   Depression 01/06/2015   Skin lesion 10/02/2014   Dupuytren's contracture 08/20/2014   Cough 07/21/2014   Lumbar stenosis with neurogenic claudication 06/13/2014   S/P deep brain stimulator placement 05/08/2014   Advance care planning 01/19/2014   Parkinson's disease ([place]) 12/13/2013   PTSD (post-traumatic stress disorder) 06/13/2013   Erectile dysfunction 06/13/2013   HLD (hyperlipidemia) 06/13/2013   Essential hypertension 06/03/2013   Bradycardia by electrocardiogram 06/03/2013   Obstructive sleep apnea 03/12/2013    Particia Lather, PT 06/28/2021, 12:57 PM  [place] MAIN [facility] SERVICES [address] [place], [place], [address] Phone: [phone]   Fax:  [phone]  Name: [name] MRN: [id number] Date of Birth: 1950/05/30

## 2021-06-30 ENCOUNTER — Encounter: Payer: Self-pay | Admitting: Physical Therapy

## 2021-06-30 ENCOUNTER — Ambulatory Visit: Payer: Medicare PPO | Admitting: Physical Therapy

## 2021-06-30 ENCOUNTER — Other Ambulatory Visit: Payer: Self-pay

## 2021-06-30 ENCOUNTER — Ambulatory Visit: Payer: Medicare PPO

## 2021-06-30 ENCOUNTER — Encounter: Payer: Medicare PPO | Admitting: Speech Pathology

## 2021-06-30 DIAGNOSIS — R278 Other lack of coordination: Secondary | ICD-10-CM | POA: Diagnosis not present

## 2021-06-30 DIAGNOSIS — R269 Unspecified abnormalities of gait and mobility: Secondary | ICD-10-CM

## 2021-06-30 DIAGNOSIS — R262 Difficulty in walking, not elsewhere classified: Secondary | ICD-10-CM

## 2021-06-30 DIAGNOSIS — R296 Repeated falls: Secondary | ICD-10-CM | POA: Diagnosis not present

## 2021-06-30 DIAGNOSIS — R2681 Unsteadiness on feet: Secondary | ICD-10-CM | POA: Diagnosis not present

## 2021-06-30 DIAGNOSIS — M6281 Muscle weakness (generalized): Secondary | ICD-10-CM | POA: Diagnosis not present

## 2021-06-30 DIAGNOSIS — G20 Parkinson's disease: Secondary | ICD-10-CM | POA: Diagnosis not present

## 2021-06-30 NOTE — Therapy (Signed)
[place] [facility] [facility] ?[place], [place], [address] ?Phone: [phone]   Fax:  [phone] ? ?Physical Therapy Treatment ? ?Patient Details  ?Name: Gaelen Brager Vasconez ?MRN: [id number] ?Date of Birth: 1950-05-22 ?Referring Provider (PT): Elsie Stain, MD ? ? ?Encounter Date: 06/30/2021 ? ? PT End of Session - 06/30/21 1030   ? ? Visit Number 48   ? Number of Visits 62   ? Date for PT Re-Evaluation 08/23/21   ? Authorization Type [employer] Medicare Choice PPO   ? Progress Note Due on Visit 50   ? PT Start Time 1045   ? PT Stop Time 1130   ? PT Time Calculation (min) 45 min   ? Equipment Utilized During Treatment Gait belt   ? Activity Tolerance Patient tolerated treatment well   ? Behavior During Therapy [facility] for tasks assessed/performed   ? ?  ?  ? ?  ? ? ?Past Medical History:  ?Diagnosis Date  ? Arthritis   ? Bradycardia   ? Cancer [facility]) 2013  ? skin cancer  ? Depression   ? ptsd  ? Dysrhythmia   ? chronic slow heart rate  ? GERD (gastroesophageal reflux disease)   ? Headache(784.0)   ? tension headaches non recent  ? History of chicken pox   ? History of kidney stones   ? passed  ? Hypertension   ? treated with HCTZ  ? Pacemaker   ? Parkinson's disease ([place])   ? dx'ed 15 years ago  ? PTSD (post-traumatic stress disorder)   ? Shortness of breath dyspnea   ? Sleep apnea   ? doesn't use C-pap  ? Varicose veins   ? ? ?Past Surgical History:  ?Procedure Laterality Date  ? CHOLECYSTECTOMY N/A 10/22/2014  ? Procedure: LAPAROSCOPIC CHOLECYSTECTOMY WITH INTRAOPERATIVE CHOLANGIOGRAM;  Surgeon: Dia Crawford III, MD;  Location: [facility] ORS;  Service: General;  Laterality: N/A;  ? cyst removed     ? from lip as a child  ? INTRAMEDULLARY (IM) NAIL INTERTROCHANTERIC N/A 02/18/2019  ? Procedure: INTRAMEDULLARY (IM) NAIL INTERTROCHANTRIC, RIGHT,;  Surgeon: Thornton Park, MD;  Location: [facility] ORS;  Service: Orthopedics;  Laterality: N/A;  ? LUMBAR LAMINECTOMY/DECOMPRESSION  MICRODISCECTOMY Bilateral 12/14/2012  ? Procedure: Bilateral lumbar three-four, four-five decompressive laminotomy/foraminotomy;  Surgeon: Charlie Pitter, MD;  Location: [place] [facility]O ORS;  Service: Neurosurgery;  Laterality: Bilateral;  ? PULSE GENERATOR IMPLANT Bilateral 12/13/2013  ? Procedure: Bilateral implantable pulse generator placement;  Surgeon: Erline Levine, MD;  Location: [place] NEURO ORS;  Service: Neurosurgery;  Laterality: Bilateral;  Bilateral implantable pulse generator placement  ? skin cancer removed    ? from ears,   12 lft arm  rt leg 15  ? SUBTHALAMIC STIMULATOR BATTERY REPLACEMENT Bilateral 07/14/2017  ? Procedure: BILATERAL IMPLANTED PULSE GENERATOR CHANGE FOR DEEP BRAIN STIMULATOR;  Surgeon: Erline Levine, MD;  Location: [place];  Service: Neurosurgery;  Laterality: Bilateral;  ? SUBTHALAMIC STIMULATOR INSERTION Bilateral 12/06/2013  ? Procedure: SUBTHALAMIC STIMULATOR INSERTION;  Surgeon: Erline Levine, MD;  Location: [place] NEURO ORS;  Service: Neurosurgery;  Laterality: Bilateral;  Bilateral deep brain stimulator placement  ? ? ?There were no vitals filed for this visit. ? ? Subjective Assessment - 06/30/21 1029   ? ? Subjective Pt reports one fall in his bedroom in the last 2 days. Reports he is unsure of conditions or reason he fell but stated it " could not be helped". He reports he was stanidng up but he is unsure  if his walker was nearby or not.   ? Patient is accompained by: Family member   ? Pertinent History Romeo Zielinski is a 24yoM who comes to [facility] for evaluation of conitnued difficulty of imbalance, unsteadiness on feet, and generally limited tolerance to mobility in the setting of chronic parkinsonism. SInce DC in February, pt has had PPM placement due to bradycardia issues. Pt also started using an UpWalker device 2 week prior to this evaluation, goal was to help with gradual melting of postural extension that occured with sustained upright actiivty. Pt has continued to practice  supervised AMB with caregivers, short distances out of house and in community. Pt continues to experience frequent falls on a weekly basis, at a rate that both he and wife report to be increasing in frequency. PMH: HTN, bradycardia s/p PPM, PD, DBS, COVID-19 infection, chronic low back pain s/p L4/5 surgery.   ? Limitations Walking;Standing;Sitting;House hold activities;Lifting   ? How long can you sit comfortably? Not limited   ? How long can you stand comfortably? 5 minutes with support   ? How long can you walk comfortably? ~5-6 minutes [name]   ? Patient Stated Goals Reduce falls, improve AMB tolerance   ? Currently in Pain? Yes   ? Pain Location Back   ? Pain Orientation Lower   ? Pain Descriptors / Indicators Aching   ? Pain Type Chronic pain   ? Pain Onset More than a month ago   ? ?  ?  ? ?  ? ? ?Exercise/Activity Sets/Reps/Time/ Resistance Assistance Charge type Comments- Unless otherwise stated, CGA was provided and gait belt donned in order to ensure pt safety ?  ?      ?Hip abduction in standing  2 x 10 ea LE with 5# AW donned   Therex  External cues for aiming of LE to ensure proper muscle activation, prior to cues pt performing hip flexion combines with hip abduction movement pattern.  ?-fatogue noted at end of second rep and pt form deteriorated. Good form prior.   ?Stair taps to 6 inch step with TB in between feet for external cue to prevent NBOS and with shoulder horizontal ABD for larger amplitude movement  2 x 10 x ea LE CGA, No UE A this session  Gait training  Cues for large amplitude movement, " buzzer" when pt hit green TB for knowledge of performance throughout.  ?Intermittent catching of feet on step but improved with practice   ?SLS progression- 1 LE on airex, contra on 6 in step therband pad  2 x 60 sec ea   Therex Cues for maintaining knee extension and posture, particularly with R LE posterior/as primary stance LE. Difficulty maintaining TKE on the R > L, improved efficacy with  maintenance of posture this session with less need for cues   ?LAQ 2 x 10 x 5# on ea LE   There ex To improve knee extension strength  ?External cues for full knee extension ROM.   ?STS with PWR! Up modification  2 x 10 standard  ? CGA,   There ACT No LOB or pain this session.  ?Cues for forward reach  ?      ?      ?PRW! Step to airex pad ( external cue for step length)  2 x 10   CGA  Therex  Pad for increased balance challenge as well as for external cue for step length. Cues for proper UE movement.  ?-  Missed pad and had trip on one attempt, able to correct with stepping pattern, CGA was provided for safety throughout  ?PWR! Up standing  1 x 10  CGA There ex  Visual and verbal cues throughout, improved performance when mirroring PT   ?Gait training with external cues for gait  X 300  feet  Up walker  Gait training TB hanging from up walker to improve step width with gait as well as for cues for proximity to up walker  ?No " tipping of walker" this session indicating improved stability with walker, walked both directions for assessment of left and right turns.   ?Treatment Provided this session  ? ?Pt educated throughout session about proper posture and technique with exercises. Improved exercise technique, movement at target joints, use of target muscles after min to mod verbal, visual, tactile cues. ? ?Note: Portions of this document were prepared using Dragon voice recognition software and although reviewed may contain unintentional dictation errors in syntax, grammar, or spelling. ? ? ? ? ? ? ? ? ? ? ? ? ? ? ? ? ? ? ? ? ? ? ? ? ? ? ? ? ? ? ? PT Education - 06/30/21 1029   ? ? Education provided Yes   ? Education Details exercise form and technique   ? Person(s) Educated Patient   ? Methods Explanation   ? Comprehension Verbalized understanding   ? ?  ?  ? ?  ? ? ? PT Short Term Goals - 05/31/21 1134   ? ?  ? PT SHORT TERM GOAL #1  ? Title Pt to demonstrate improved sustained AMB tolerance >813f.   ? Baseline  At evlauation: 6049ftolerance (not to failure); 9/26: [id number]   ? Time 4   ? Period Weeks   ? Status Achieved   ? Target Date 01/05/21   ?  ? PT SHORT TERM GOAL #2  ? Title Pt will improve BERG balance score to greater th

## 2021-07-01 NOTE — Therapy (Signed)
[place] [place] [facility] ?[place], [place], [address] ?Phone: [phone]   Fax:  [phone] ? ?Occupational Therapy Treatment ? ?Patient Details  ?Name: [name] ?MRN: [id number] ?Date of Birth: 04/18/1951 ?Referring Provider (OT): Dr. Sharolyn Douglas (neurologist at [facility]) ? ? ?Encounter Date: 06/30/2021 ? ? OT End of Session - 07/01/21 0757   ? ? Visit Number 13   ? Number of Visits 24   ? Date for OT Re-Evaluation 08/03/21   ? OT Start Time 1000   ? OT Stop Time 1045   ? OT Time Calculation (min) 45 min   ? Equipment Utilized During Treatment rollator   ? Activity Tolerance Patient tolerated treatment well   ? Behavior During Therapy [facility] for tasks assessed/performed   ? ?  ?  ? ?  ? ? ?Past Medical History:  ?Diagnosis Date  ? Arthritis   ? Bradycardia   ? Cancer [facility]) 2013  ? skin cancer  ? Depression   ? ptsd  ? Dysrhythmia   ? chronic slow heart rate  ? GERD (gastroesophageal reflux disease)   ? Headache(784.0)   ? tension headaches non recent  ? History of chicken pox   ? History of kidney stones   ? passed  ? Hypertension   ? treated with HCTZ  ? Pacemaker   ? Parkinson's disease ([place])   ? dx'ed 15 years ago  ? PTSD (post-traumatic stress disorder)   ? Shortness of breath dyspnea   ? Sleep apnea   ? doesn't use C-pap  ? Varicose veins   ? ? ?Past Surgical History:  ?Procedure Laterality Date  ? CHOLECYSTECTOMY N/A 10/22/2014  ? Procedure: LAPAROSCOPIC CHOLECYSTECTOMY WITH INTRAOPERATIVE CHOLANGIOGRAM;  Surgeon: Dia Crawford III, MD;  Location: [facility] ORS;  Service: General;  Laterality: N/A;  ? cyst removed     ? from lip as a child  ? INTRAMEDULLARY (IM) NAIL INTERTROCHANTERIC N/A 02/18/2019  ? Procedure: INTRAMEDULLARY (IM) NAIL INTERTROCHANTRIC, RIGHT,;  Surgeon: Thornton Park, MD;  Location: [facility] ORS;  Service: Orthopedics;  Laterality: N/A;  ? LUMBAR LAMINECTOMY/DECOMPRESSION MICRODISCECTOMY Bilateral 12/14/2012  ? Procedure: Bilateral lumbar  three-four, four-five decompressive laminotomy/foraminotomy;  Surgeon: Charlie Pitter, MD;  Location: [place] NEURO ORS;  Service: Neurosurgery;  Laterality: Bilateral;  ? PULSE GENERATOR IMPLANT Bilateral 12/13/2013  ? Procedure: Bilateral implantable pulse generator placement;  Surgeon: Erline Levine, MD;  Location: [place] NEURO ORS;  Service: Neurosurgery;  Laterality: Bilateral;  Bilateral implantable pulse generator placement  ? skin cancer removed    ? from ears,   12 lft arm  rt leg 15  ? SUBTHALAMIC STIMULATOR BATTERY REPLACEMENT Bilateral 07/14/2017  ? Procedure: BILATERAL IMPLANTED PULSE GENERATOR CHANGE FOR DEEP BRAIN STIMULATOR;  Surgeon: Erline Levine, MD;  Location: [place];  Service: Neurosurgery;  Laterality: Bilateral;  ? SUBTHALAMIC STIMULATOR INSERTION Bilateral 12/06/2013  ? Procedure: SUBTHALAMIC STIMULATOR INSERTION;  Surgeon: Erline Levine, MD;  Location: [place] NEURO ORS;  Service: Neurosurgery;  Laterality: Bilateral;  Bilateral deep brain stimulator placement  ? ? ?There were no vitals filed for this visit. ? ? Subjective Assessment - 06/30/21 0754   ? ? Subjective  "He fell yesterday." (per spouse)   ? Patient is accompanied by: Family member   ? Pertinent History Parkinson's Disease Diagnosed in 2000, frequent falls   ? Limitations UE/LE tremors, decreased balance, lack of coordination, intermittent diplopia (has prism glasses)   ? Patient Stated Goals Pt/spouse verbalize desire to improve [facility] skills  for holding playing cards, and improving shoulder flexibility and posture for better walker use.   ? Currently in Pain? Yes   ? Pain Score 2    ? Pain Location Back   ? Pain Orientation Lower   ? Pain Descriptors / Indicators Aching   ? Pain Type Chronic pain   ? Pain Radiating Towards low back/L hip   ? Pain Onset More than a month ago   ? Pain Frequency Intermittent   ? Aggravating Factors  walking, transfers   ? Pain Relieving Factors rest, heat   ? Effect of Pain on Daily Activities difficulty with ADLs and  walking   ? Multiple Pain Sites No   ? Pain Onset More than a month ago   ? ?  ?  ? ?  ? ?Occupational Therapy Treatment: ?Therapeutic Exercise: ?Facilitated pinch strengthening with use of therapy resistant clothespins to target lateral and 3 point pinch of R/L hands.  Worked with all colors (light to strong resistance) to place pins on vertical dowel.  Intermittent min vc for pinch prehension patterns. ? ?Therapeutic Activity: ?Pt sat edge of mat to play a round of cards with tray table in front of pt, working to challenge sitting balance during functional task; feet supported, Ues unsupported.  Pt used card shuffler and card holder, requiring occasional min A and min vc to manage placing and discarding cards from holder without holder tipping or cards falling.  OT repositioned object on tray table to center objects to improve midline sitting.  Spouse reports pt tends to have objects positioned to his R, often causing R lateral lean.  OT encouraged midline placement of objects when able, if not slightly to the L to overcompensate for R lateral lean.  Overall, pt maintained midline sitting well throughout card activity once objects were placed in midline.  ? ?Response to tx: ?See Plan/clinical impression below. ? ? ? OT Education - 06/30/21 0756   ? ? Education Details sitting balance   ? Person(s) Educated Patient;Spouse   ? Methods Explanation;Verbal cues;Demonstration   ? Comprehension Verbalized understanding;Returned demonstration;Verbal cues required;Need further instruction   ? ?  ?  ? ?  ? ? ? OT Short Term Goals - 06/21/21 1143   ? ?  ? OT SHORT TERM GOAL #1  ? Title Pt/caregiver will be indep with HEP   ? Baseline Eval: not yet initiated; Pt has been consistent to send text messages as part of his home activity program/ issued theraputty today with instruction in coordination exercises, further training needed   ? Time 6   ? Period Weeks   ? Status Partially Met   ? Target Date 06/22/21   ?  ? OT SHORT  TERM GOAL #2  ? Title --   ? Baseline Eval: R 41 sec, L 41 sec; 06/21/21: R 41 sec, L 34 sec   ? Time --   ? Status On-going   ? Target Date --   ? ?  ?  ? ?  ? ? ? ? OT Long Term Goals - 06/21/21 1150   ? ?  ? OT LONG TERM GOAL #1  ? Title Pt will improve FOTO score to 40 or better to indicate increased functional improvement.   ? Baseline Eval: FOTO 34; 06/21/21: FOTO 40   ? Time 12   ? Period Weeks   ? Status On-going   ? Target Date 08/03/21   ?  ? OT LONG TERM GOAL #2  ?  Title Pt will improve R lateral pinch strength to be able to tolerate holding playing cards in R dominant hand for game of cards.   ? Baseline Eval: R lateral pinch 16 lbs; pt enjoys playing cards but is not currently playing d/t decreased strength/coordination; 06/21/21: R 25, L 19 (pt also purchased card holder to compensate for "off" days or times)   ? Time 12   ? Period Weeks   ? Status On-going   ? Target Date 08/03/21   ?  ? OT LONG TERM GOAL #3  ? Title Pt will improve R dominant hand to mouth coordination to self feed with minimal spilling and utilize adapted utensils as needed.   ? Baseline Eval: Spouse reports eating is messy and pt struggles to keep utensil level; 06/21/21: Pt uses long handled teaspoon; OT encouraged applying foam to build up handle as pt reports it's becoming more difficult to grip.  Pt reports stuggle with hand to mouth coordination and continues to spill.   ? Time 12   ? Period Weeks   ? Status On-going   ? Target Date 08/03/21   ?  ? OT LONG TERM GOAL #4  ? Title Pt will improve bilat shoulder flex/abd for improved overhead reach during self care.   ? Baseline Eval: R shoulder flex 110, L 120, R abd 125, L 165; R shoulder flex; 06/21/21: R flex 115, L 143; R abd 144, L 170   ? Time 12   ? Period Weeks   ? Status On-going   ? Target Date 08/03/21   ?  ? OT LONG TERM GOAL #5  ? Title Pt will improve [place] in bilat hands as noted by improvement in 9 hole peg test score by 5 or more seconds to enable participate in family board  games.   ? Baseline Eval: R/L 9 hole peg test 41 secs (pt has not recently participated in board games); 06/21/21: R 41 sec, L 34 sec   ? Time 12   ? Period Weeks   ? Status Partially Met   ? Target Date 08/03/21

## 2021-07-02 ENCOUNTER — Telehealth: Payer: Self-pay | Admitting: Family Medicine

## 2021-07-02 NOTE — Progress Notes (Signed)
?  Chronic Care Management  ? ?Note ? ?07/02/2021 ?Name: COSMO TETREAULT MRN: [id number] DOB: 1950-11-22 ? ?ARSALAN BRISBIN is a 71 y.o. year old male who is a primary care patient of Tonia Ghent, MD. I reached out to Noel Journey by phone today in response to a referral sent by Mr. Zandra Abts Corlett's PCP, Tonia Ghent, MD.  ? ?Mr. Calles was given information about Chronic Care Management services today including:  ?CCM service includes personalized support from designated clinical staff supervised by his physician, including individualized plan of care and coordination with other care providers ?24/7 contact phone numbers for assistance for urgent and routine care needs. ?Service will only be billed when office clinical staff spend 20 minutes or more in a month to coordinate care. ?Only one practitioner may furnish and bill the service in a calendar month. ?The patient may stop CCM services at any time (effective at the end of the month) by phone call to the office staff. ? ? ?[name]/SPOUSE verbally agreed to assistance and services provided by embedded care coordination/care management team today. ? ?Follow up plan: ? ? ?Tatjana Dellinger ?Upstream Scheduler  ?

## 2021-07-02 NOTE — Chronic Care Management (AMB) (Signed)
?  Chronic Care Management  ? ?Outreach Note ? ?07/02/2021 ?Name: [name] MRN: [id number] DOB: [birth year] ? ?Referred by: Tonia Ghent, MD ?Reason for referral : No chief complaint on file. ? ? ?An unsuccessful telephone outreach was attempted today. The patient was referred to the pharmacist for assistance with care management and care coordination.  ? ?Follow Up Plan:  ? ?Tatjana Dellinger ?Upstream Scheduler  ?

## 2021-07-05 ENCOUNTER — Ambulatory Visit: Payer: Medicare PPO | Admitting: Physical Therapy

## 2021-07-05 ENCOUNTER — Encounter: Payer: Medicare PPO | Admitting: Speech Pathology

## 2021-07-05 ENCOUNTER — Ambulatory Visit: Payer: Medicare PPO

## 2021-07-07 ENCOUNTER — Encounter: Payer: Medicare PPO | Admitting: Speech Pathology

## 2021-07-07 ENCOUNTER — Ambulatory Visit: Payer: Medicare PPO

## 2021-07-07 ENCOUNTER — Ambulatory Visit: Payer: Medicare PPO | Admitting: Physical Therapy

## 2021-07-07 ENCOUNTER — Encounter: Payer: Self-pay | Admitting: Physical Therapy

## 2021-07-07 ENCOUNTER — Other Ambulatory Visit: Payer: Self-pay

## 2021-07-07 DIAGNOSIS — R278 Other lack of coordination: Secondary | ICD-10-CM | POA: Diagnosis not present

## 2021-07-07 DIAGNOSIS — G894 Chronic pain syndrome: Secondary | ICD-10-CM | POA: Diagnosis not present

## 2021-07-07 DIAGNOSIS — R296 Repeated falls: Secondary | ICD-10-CM | POA: Diagnosis not present

## 2021-07-07 DIAGNOSIS — M4726 Other spondylosis with radiculopathy, lumbar region: Secondary | ICD-10-CM | POA: Diagnosis not present

## 2021-07-07 DIAGNOSIS — R2681 Unsteadiness on feet: Secondary | ICD-10-CM | POA: Diagnosis not present

## 2021-07-07 DIAGNOSIS — R262 Difficulty in walking, not elsewhere classified: Secondary | ICD-10-CM

## 2021-07-07 DIAGNOSIS — G20 Parkinson's disease: Secondary | ICD-10-CM | POA: Diagnosis not present

## 2021-07-07 DIAGNOSIS — M6281 Muscle weakness (generalized): Secondary | ICD-10-CM | POA: Diagnosis not present

## 2021-07-07 DIAGNOSIS — Z79899 Other long term (current) drug therapy: Secondary | ICD-10-CM | POA: Diagnosis not present

## 2021-07-07 DIAGNOSIS — R269 Unspecified abnormalities of gait and mobility: Secondary | ICD-10-CM | POA: Diagnosis not present

## 2021-07-07 NOTE — Therapy (Signed)
[place] [facility] ?[place], [place], [address] ?Phone: [phone]   Fax:  [phone] ? ?Physical Therapy Treatment ? ?Patient Details  ?Name: [name] ?MRN: [id number] ?Date of Birth: [birth year] ?Referring Provider (PT): George Stain, MD ? ? ?Encounter Date: 07/07/2021 ? ? PT End of Session - 07/07/21 1054   ? ? Visit Number 7   ? Number of Visits 62   ? Date for PT Re-Evaluation 08/23/21   ? Authorization Type [employer] Medicare Choice PPO   ? Progress Note Due on Visit 50   ? PT Start Time 1058   ? PT Stop Time 2423   ? PT Time Calculation (min) 45 min   ? Equipment Utilized During Treatment Gait belt   ? Activity Tolerance Patient tolerated treatment well   ? Behavior During Therapy [facility] for tasks assessed/performed   ? ?  ?  ? ?  ? ? ?Past Medical History:  ?Diagnosis Date  ? Arthritis   ? Bradycardia   ? Cancer [facility]) 2013  ? skin cancer  ? Depression   ? ptsd  ? Dysrhythmia   ? chronic slow heart rate  ? GERD (gastroesophageal reflux disease)   ? Headache(784.0)   ? tension headaches non recent  ? History of chicken pox   ? History of kidney stones   ? passed  ? Hypertension   ? treated with HCTZ  ? Pacemaker   ? Parkinson's disease ([place] )   ? dx'ed 15 years ago  ? PTSD (post-traumatic stress disorder)   ? Shortness of breath dyspnea   ? Sleep apnea   ? doesn't use C-pap  ? Varicose veins   ? ? ?Past Surgical History:  ?Procedure Laterality Date  ? CHOLECYSTECTOMY N/A 10/22/2014  ? Procedure: LAPAROSCOPIC CHOLECYSTECTOMY WITH INTRAOPERATIVE CHOLANGIOGRAM;  Surgeon: Dia Crawford III, MD;  Location: [facility] ORS;  Service: General;  Laterality: N/A;  ? cyst removed     ? from lip as a child  ? INTRAMEDULLARY (IM) NAIL INTERTROCHANTERIC N/A 02/18/2019  ? Procedure: INTRAMEDULLARY (IM) NAIL INTERTROCHANTRIC, RIGHT,;  Surgeon: Thornton Park, MD;  Location: [facility] ORS;  Service: Orthopedics;  Laterality: N/A;  ? LUMBAR LAMINECTOMY/DECOMPRESSION  MICRODISCECTOMY Bilateral 12/14/2012  ? Procedure: Bilateral lumbar three-four, four-five decompressive laminotomy/foraminotomy;  Surgeon: Charlie Pitter, MD;  Location: [place] NEURO ORS;  Service: Neurosurgery;  Laterality: Bilateral;  ? PULSE GENERATOR IMPLANT Bilateral 12/13/2013  ? Procedure: Bilateral implantable pulse generator placement;  Surgeon: Erline Levine, MD;  Location: [place] NEURO ORS;  Service: Neurosurgery;  Laterality: Bilateral;  Bilateral implantable pulse generator placement  ? skin cancer removed    ? from ears,   12 lft arm  rt leg 15  ? SUBTHALAMIC STIMULATOR BATTERY REPLACEMENT Bilateral 07/14/2017  ? Procedure: BILATERAL IMPLANTED PULSE GENERATOR CHANGE FOR DEEP BRAIN STIMULATOR;  Surgeon: Erline Levine, MD;  Location: [place];  Service: Neurosurgery;  Laterality: Bilateral;  ? SUBTHALAMIC STIMULATOR INSERTION Bilateral 12/06/2013  ? Procedure: SUBTHALAMIC STIMULATOR INSERTION;  Surgeon: Erline Levine, MD;  Location: [place] NEURO ORS;  Service: Neurosurgery;  Laterality: Bilateral;  Bilateral deep brain stimulator placement  ? ? ?There were no vitals filed for this visit. ? ? Subjective Assessment - 07/07/21 1053   ? ? Subjective Pt reports significant pain in back yesterday but is is feeling better today. Reports no falls since last visit.   ? Patient is accompained by: Family member   ? Pertinent History [name] is a 5yoM who comes  to [facility] OPPT neuro for evaluation of conitnued difficulty of imbalance, unsteadiness on feet, and generally limited tolerance to mobility in the setting of chronic parkinsonism. SInce DC in February, pt has had PPM placement due to bradycardia issues. Pt also started using an UpWalker device 2 week prior to this evaluation, goal was to help with gradual melting of postural extension that occured with sustained upright actiivty. Pt has continued to practice supervised AMB with caregivers, short distances out of house and in community. Pt continues to experience frequent falls  on a weekly basis, at a rate that both he and wife report to be increasing in frequency. PMH: HTN, bradycardia s/p PPM, PD, DBS, COVID-19 infection, chronic low back pain s/p L4/5 surgery.   ? Limitations Walking;Standing;Sitting;House hold activities;Lifting   ? How long can you sit comfortably? Not limited   ? How long can you stand comfortably? 5 minutes with support   ? How long can you walk comfortably? ~5-6 minutes [name]   ? Patient Stated Goals Reduce falls, improve AMB tolerance   ? Pain Onset More than a month ago   ? ?  ?  ? ?  ? ? ? ? ?Exercise/Activity Sets/Reps/Time/ Resistance Assistance Charge type Comments- Unless otherwise stated, CGA was provided and gait belt donned in order to ensure pt safety ?  ?Leg press  X15'@55'$ # ?*12'@70'$ # ?*10'@85'$ # ?*8'@100'$ #  therex Assistance for LE positioning throughout   ?      ?Stair taps to 6 inch step with TB in between feet for external cue to prevent NBOS and with shoulder horizontal ABD for larger amplitude movement  2 x 10 x ea LE CGA, No UE A this session  Gait training  Cues for large amplitude movement, " buzzer" when pt hit green TB for knowledge of performance throughout.  ?Intermittent catching of feet on step but improved with practice   ?      ?LAQ 2 x 10 x 5# on ea LE   There ex To improve knee extension strength  ?External cues for full knee extension ROM.   ?Ball roll outs  3 way 10 x 5 sec holds   Therex  To improve spinal mobility and discomfort in low back area   ?      ?      ?PRW! Step to airex pad ( external cue for step length)  2 x 10   CGA  Therex  Pad for increased balance challenge as well as for external cue for step length. Cues for proper UE movement.  ?- Missed pad and had trip on one attempt, able to correct with stepping pattern, CGA was provided for safety throughout  ?PWR! Up standing  1 x 10  CGA There ex  Visual and verbal cues throughout, improved performance when mirroring PT   ?Gait training with external cues for gait  X  300  feet  Up walker  Gait training TB hanging from up walker to improve step width with gait as well as for cues for proximity to up walker  ?No " tipping of walker" this session indicating improved stability with walker, walked both directions for assessment of left and right turns.   ?Treatment Provided this session  ? ?Pt educated throughout session about proper posture and technique with exercises. Improved exercise technique, movement at target joints, use of target muscles after min to mod verbal, visual, tactile cues. ? ?Note: Portions of this document were prepared using Systems analyst and  although reviewed may contain unintentional dictation errors in syntax, grammar, or spelling. ? ? ? ? ? ? ? ? ? ? ? ? ? ? ? ? ? ? ? ? ? ? ? ? ? ? ? PT Education - 07/07/21 1054   ? ? Education provided Yes   ? Education Details Exercise form nd technique   ? Person(s) Educated Patient   ? Methods Explanation   ? Comprehension Verbalized understanding   ? ?  ?  ? ?  ? ? ? PT Short Term Goals - 05/31/21 1134   ? ?  ? PT SHORT TERM GOAL #1  ? Title Pt to demonstrate improved sustained AMB tolerance >851f.   ? Baseline At evlauation: 6074ftolerance (not to failure); 9/26: [id number]   ? Time 4   ? Period Weeks   ? Status Achieved   ? Target Date 01/05/21   ?  ? PT SHORT TERM GOAL #2  ? Title Pt will improve BERG balance score to greater than 45 to indicate decrease risk of falls.   ? Baseline 2/13:44   ? Time 4   ? Period Weeks   ? Status On-going   ? Target Date 06/28/21   ?  ? PT SHORT TERM GOAL #3  ? Title Pt to demonstrate 10MWT<13sec to show self-selected gait speed appropriate for AMB outside of house and to improve energy efficiency.   ? Baseline UpWalker Eval: 17.53sec; 9/26: 0.74 m/s (13.5)11/21: 13.68 (.73 m/s) 12.64 on 12/28 9.88 pn2/13   ? Time 4   ? Period Weeks   ? Status Achieved   ? Target Date 02/08/21   ? ?  ?  ? ?  ? ? ? ? PT Long Term Goals - 05/31/21 1136   ? ?  ? PT LONG TERM GOAL #1  ?  Title Pt will improve BERG balance score to greater than 47 to indicate decreaed risk of falls.   ? Baseline 41 on 12/28 44 on 2/13   ? Time 8   ? Period Weeks   ? Status On-going   ? Target Date 07/26/21   ?  ? PT

## 2021-07-08 NOTE — Therapy (Signed)
[place] [facility] MAIN [facility] SERVICES [address] [place], [place], [address] Phone: [phone]   Fax:  [phone]  Occupational Therapy Treatment  Patient Details  Name: [name] MRN: [id number] Date of Birth: 12-Oct-1950 Referring Provider (OT): Dr. Si Gaul (neurologist at [facility])   Encounter Date: 07/07/2021   OT End of Session - 07/08/21 0809     Visit Number 14    Number of Visits 24    Date for OT Re-Evaluation 08/03/21    OT Start Time 1145    OT Stop Time 1230    OT Time Calculation (min) 45 min    Equipment Utilized During Treatment rollator    Activity Tolerance Patient tolerated treatment well    Behavior During Therapy [facility] for tasks assessed/performed             Past Medical History:  Diagnosis Date   Arthritis    Bradycardia    Cancer (HCC) 2013   skin cancer   Depression    ptsd   Dysrhythmia    chronic slow heart rate   GERD (gastroesophageal reflux disease)    Headache(784.0)    tension headaches non recent   History of chicken pox    History of kidney stones    passed   Hypertension    treated with HCTZ   Pacemaker    Parkinson's disease (HCC)    dx'ed 15 years ago   PTSD (post-traumatic stress disorder)    Shortness of breath dyspnea    Sleep apnea    doesn't use C-pap   Varicose veins     Past Surgical History:  Procedure Laterality Date   CHOLECYSTECTOMY N/A 10/22/2014   Procedure: LAPAROSCOPIC CHOLECYSTECTOMY WITH INTRAOPERATIVE CHOLANGIOGRAM;  Surgeon: Tiney Rouge III, MD;  Location: [facility] ORS;  Service: General;  Laterality: N/A;   cyst removed      from lip as a child   INTRAMEDULLARY (IM) NAIL INTERTROCHANTERIC N/A 02/18/2019   Procedure: INTRAMEDULLARY (IM) NAIL INTERTROCHANTRIC, RIGHT,;  Surgeon: Juanell Fairly, MD;  Location: [facility] ORS;  Service: Orthopedics;  Laterality: N/A;   LUMBAR LAMINECTOMY/DECOMPRESSION MICRODISCECTOMY Bilateral 12/14/2012   Procedure: Bilateral lumbar  three-four, four-five decompressive laminotomy/foraminotomy;  Surgeon: Temple Pacini, MD;  Location: [facility]S;  Service: Neurosurgery;  Laterality: Bilateral;   PULSE GENERATOR IMPLANT Bilateral 12/13/2013   Procedure: Bilateral implantable pulse generator placement;  Surgeon: Maeola Harman, MD;  Location: [facility] ORS;  Service: Neurosurgery;  Laterality: Bilateral;  Bilateral implantable pulse generator placement   skin cancer removed     from ears,   12 lft arm  rt leg 15   SUBTHALAMIC STIMULATOR BATTERY REPLACEMENT Bilateral 07/14/2017   Procedure: BILATERAL IMPLANTED PULSE GENERATOR CHANGE FOR DEEP BRAIN STIMULATOR;  Surgeon: Maeola Harman, MD;  Location: [facility] OR;  Service: Neurosurgery;  Laterality: Bilateral;   SUBTHALAMIC STIMULATOR INSERTION Bilateral 12/06/2013   Procedure: SUBTHALAMIC STIMULATOR INSERTION;  Surgeon: Maeola Harman, MD;  Location: [facility]S;  Service: Neurosurgery;  Laterality: Bilateral;  Bilateral deep brain stimulator placement    There were no vitals filed for this visit.   Subjective Assessment - 07/07/21 0806     Subjective  "We couldn't make it in the other day because his back was hurting so badly.  We're going to the pain clinic today." (per spouse)    Patient is accompanied by: Family member    Pertinent History Parkinson's Disease Diagnosed in 2000, frequent falls    Limitations UE/LE tremors, decreased balance, lack  of coordination, intermittent diplopia (has prism glasses)    Patient Stated Goals Pt/spouse verbalize desire to improve [facility] skills for holding playing cards, and improving shoulder flexibility and posture for better walker use.    Currently in Pain? Yes    Pain Score 8     Pain Location Back    Pain Orientation Lower    Pain Descriptors / Indicators Aching    Pain Type Chronic pain    Pain Radiating Towards low back/L hip    Pain Onset More than a month ago    Pain Frequency Intermittent    Aggravating Factors  walking, transfers    Pain  Relieving Factors rest, heat    Effect of Pain on Daily Activities difficulty with ADLs and walking    Multiple Pain Sites No    Pain Onset More than a month ago            Occupational Therapy Treatment: Therapeutic Activity: Practiced walker safety and directional turns in place to simulate turning in tight quarters in a bedroom/bathroom/restaurant.  Pt demonstrating ability to turn in place 75% of the time with close supv, 25% of the time pt required mod vc and tactile cues for steering walker to avoid a wide turn.  Pt practiced turning L and R, reporting that he felt more confident turning to the R, but with both directions pt was able to keep feet within walker 100% of the time with min vc or less, and no noted tilting of the walker during any turns.  Provided positive reinforcement for making slow, controlled turns to reduce fall risk.   Self Care: Educated on self feeding strategies to reduce spilling and mess.  Encouraged pt ensure his chair is positioned as close to the table as comfortably possible to reduce distance between plate and mouth.  Spouse reported pt tends to ignore use of L hand when eating, when he could be using it to steady his plate, and help to scoop food onto his utensil.  OT encouraged spouse provide vc or tactile cues to pt for incorporating L hand into tasks noted above, as pt will need ongoing carry over d/t decreased body awareness and memory deficits.  Spouse verbalized pt with increasing difficulty holding a sandwich together.  OT encouraged spouse wrap sandwich in sandwich bag, paper towel, or obtain sandwich holder to reduce mess.  Assisted spouse to identify options online and spouse purchased holder that should arrive this week.  Recommended pt utilize dicem to steady plate at kitchen table and when using his food tray when eating in his chair.  Pt receptive to all recommendations.   Response to Treatment: See Plan/clinical impression below.    OT Education  - 07/07/21 0808     Education Details self feeding strategies; safety and technique for turning in place with RW    Person(s) Educated Patient;Spouse    Methods Explanation;Verbal cues;Demonstration;Tactile cues    Comprehension Verbalized understanding;Returned demonstration;Verbal cues required;Need further instruction;Tactile cues required              OT Short Term Goals - 06/21/21 1143       OT SHORT TERM GOAL #1   Title Pt/caregiver will be indep with HEP    Baseline Eval: not yet initiated; Pt has been consistent to send text messages as part of his home activity program/ issued theraputty today with instruction in coordination exercises, further training needed    Time 6    Period Weeks    Status  Partially Met    Target Date 06/22/21      OT SHORT TERM GOAL #2   Title --    Baseline Eval: R 41 sec, L 41 sec; 06/21/21: R 41 sec, L 34 sec    Time --    Status On-going    Target Date --               OT Long Term Goals - 06/21/21 1150       OT LONG TERM GOAL #1   Title Pt will improve FOTO score to 40 or better to indicate increased functional improvement.    Baseline Eval: FOTO 34; 06/21/21: FOTO 40    Time 12    Period Weeks    Status On-going    Target Date 08/03/21      OT LONG TERM GOAL #2   Title Pt will improve R lateral pinch strength to be able to tolerate holding playing cards in R dominant hand for game of cards.    Baseline Eval: R lateral pinch 16 lbs; pt enjoys playing cards but is not currently playing d/t decreased strength/coordination; 06/21/21: R 25, L 19 (pt also purchased card holder to compensate for "off" days or times)    Time 12    Period Weeks    Status On-going    Target Date 08/03/21      OT LONG TERM GOAL #3   Title Pt will improve R dominant hand to mouth coordination to self feed with minimal spilling and utilize adapted utensils as needed.    Baseline Eval: Spouse reports eating is messy and pt struggles to keep utensil level;  06/21/21: Pt uses long handled teaspoon; OT encouraged applying foam to build up handle as pt reports it's becoming more difficult to grip.  Pt reports stuggle with hand to mouth coordination and continues to spill.    Time 12    Period Weeks    Status On-going    Target Date 08/03/21      OT LONG TERM GOAL #4   Title Pt will improve bilat shoulder flex/abd for improved overhead reach during self care.    Baseline Eval: R shoulder flex 110, L 120, R abd 125, L 165; R shoulder flex; 06/21/21: R flex 115, L 143; R abd 144, L 170    Time 12    Period Weeks    Status On-going    Target Date 08/03/21      OT LONG TERM GOAL #5   Title Pt will improve FMC in bilat hands as noted by improvement in 9 hole peg test score by 5 or more seconds to enable participate in family board games.    Baseline Eval: R/L 9 hole peg test 41 secs (pt has not recently participated in board games); 06/21/21: R 41 sec, L 34 sec    Time 12    Period Weeks    Status Partially Met    Target Date 08/03/21      OT LONG TERM GOAL #6   Title Pt will be able to initiate simple texts and reply to texts on phone with min vc.    Baseline Eval: Pt struggles with texting; 06/21/21: mod vc to initiate a simple text, min-mod vc to respond    Time 12    Period Weeks    Status On-going    Target Date 08/03/21      OT LONG TERM GOAL #7   Title Pt will improve posture for ADLs  and functional mobility as demonstrated by ability to keep feet within back two legs of rollator 50% of the time with min vc.    Baseline Eval: pt with forward lean and feet are behind back 2 legs of walker during functional mobility; 06/21/21: pt keeps feet within back 2 legs of walker 25% of the time with max vc.    Time 12    Status On-going    Target Date 08/03/21              Plan - 07/07/21 0827     Clinical Impression Statement Focus on safety and technique with turning in place with RW to simulate directional changes within small space/crowded  environment.  Pt demonstrating ability to turn in place 75% of the time with close supv, 25% of the time pt required mod vc and tactile cues for steering walker to avoid a wide turn.  Pt practiced turning L and R, reporting that he felt more confident turning to the R, but with both directions pt was able to keep feet within walker 100% of the time with min vc or less, and no noted tilting of the walker during any turns.  Occasional min guard with backward steps, but pt was encouraged not to backward step when possible, but rather turn in place in order to reduce fall risk.  Provided positive reinforcement for making slow, controlled turns to reduce fall risk.  Educated on self feeding adapted strategies and cueing from spouse.  Spouse receptive to all and acknowledged understanding of recommendations.  Pt will continue to benefit from skilled OT to maximize safety and indep with self care tasks.    OT Occupational Profile and History Problem Focused Assessment - Including review of records relating to presenting problem    Occupational performance deficits (Please refer to evaluation for details): ADL's;IADL's;Leisure    Body Structure / Function / Physical Skills ADL;Coordination;GMC;UE functional use;Balance;Body mechanics;Decreased knowledge of use of DME;Flexibility;Dexterity;FMC;Strength;ROM    Cognitive Skills Attention;Memory    Rehab Potential Good    Clinical Decision Making Several treatment options, min-mod task modification necessary    Comorbidities Affecting Occupational Performance: May have comorbidities impacting occupational performance    Modification or Assistance to Complete Evaluation  No modification of tasks or assist necessary to complete eval    OT Frequency 2x / week    OT Duration 12 weeks    OT Treatment/Interventions Self-care/ADL training;Therapeutic exercise;DME and/or AE instruction;Functional Mobility Training;Cognitive remediation/compensation;Balance  training;Neuromuscular education;Manual Therapy;Moist Heat;Energy conservation;Passive range of motion;Therapeutic activities;Patient/family education    OT Home Exercise Plan not yet initiated at eval    Consulted and Agree with Plan of Care Patient;Family member/caregiver    Family Member Consulted spouse, Thornell Sartorius             Patient will benefit from skilled therapeutic intervention in order to improve the following deficits and impairments:   Body Structure / Function / Physical Skills: ADL, Coordination, GMC, UE functional use, Balance, Body mechanics, Decreased knowledge of use of DME, Flexibility, Dexterity, FMC, Strength, ROM Cognitive Skills: Attention, Memory     Visit Diagnosis: Parkinson's disease (HCC)  Other lack of coordination    Problem List Patient Active Problem List   Diagnosis Date Noted   Sinus drainage 12/09/2020   Medicare annual wellness visit, subsequent 11/18/2020   Pacemaker    Dementia associated with Parkinson's disease (HCC) 06/17/2020   Vertigo 02/19/2020   Urine abnormality 02/19/2020   Dysphagia 02/23/2019   Fall at home 10/18/2017  REM behavioral disorder 11/02/2016   Radicular pain in right arm 10/09/2016   GERD (gastroesophageal reflux disease) 07/31/2016   Colon cancer screening 07/23/2015   Gout 02/18/2015   Depression 01/06/2015   Skin lesion 10/02/2014   Dupuytren's contracture 08/20/2014   Cough 07/21/2014   Lumbar stenosis with neurogenic claudication 06/13/2014   S/P deep brain stimulator placement 05/08/2014   Advance care planning 01/19/2014   Parkinson's disease (HCC) 12/13/2013   PTSD (post-traumatic stress disorder) 06/13/2013   Erectile dysfunction 06/13/2013   HLD (hyperlipidemia) 06/13/2013   Essential hypertension 06/03/2013   Bradycardia by electrocardiogram 06/03/2013   Obstructive sleep apnea 03/12/2013   Danelle Earthly, MS, OTR/L  Otis Dials, OT 07/08/2021, 8:27 AM  [place] [facility] MAIN [facility] SERVICES [address] [place], [place], [address] Phone: [phone]   Fax:  [phone]  Name: ETHELBERT ONDERKO MRN: [id number] Date of Birth: 1950-10-12

## 2021-07-12 ENCOUNTER — Other Ambulatory Visit: Payer: Self-pay

## 2021-07-12 ENCOUNTER — Ambulatory Visit: Payer: Medicare PPO

## 2021-07-12 ENCOUNTER — Ambulatory Visit: Payer: Medicare PPO | Admitting: Physical Therapy

## 2021-07-12 ENCOUNTER — Encounter: Payer: Medicare PPO | Admitting: Speech Pathology

## 2021-07-12 DIAGNOSIS — R2681 Unsteadiness on feet: Secondary | ICD-10-CM

## 2021-07-12 DIAGNOSIS — R296 Repeated falls: Secondary | ICD-10-CM

## 2021-07-12 DIAGNOSIS — R278 Other lack of coordination: Secondary | ICD-10-CM

## 2021-07-12 DIAGNOSIS — G20 Parkinson's disease: Secondary | ICD-10-CM | POA: Diagnosis not present

## 2021-07-12 DIAGNOSIS — R262 Difficulty in walking, not elsewhere classified: Secondary | ICD-10-CM | POA: Diagnosis not present

## 2021-07-12 DIAGNOSIS — R269 Unspecified abnormalities of gait and mobility: Secondary | ICD-10-CM

## 2021-07-12 DIAGNOSIS — M6281 Muscle weakness (generalized): Secondary | ICD-10-CM | POA: Diagnosis not present

## 2021-07-12 NOTE — Therapy (Signed)
[place] [facility] MAIN REHAB SERVICES ?[place], [place], [address] ?Phone: [phone]   Fax:  [phone] ? ?Occupational Therapy Treatment ? ?Patient Details  ?Name: [name] ?MRN: [id number] ?Date of Birth: Apr 18, 1951 ?Referring Provider (OT): Dr. Sharolyn Douglas (neurologist at [facility]) ? ? ?Encounter Date: 07/12/2021 ? ? OT End of Session - 07/12/21 1239   ? ? Visit Number 15   ? Number of Visits 24   ? Date for OT Re-Evaluation 08/03/21   ? OT Start Time 1015   ? OT Stop Time 1100   ? OT Time Calculation (min) 45 min   ? Equipment Utilized During Treatment rollator   ? Activity Tolerance Patient tolerated treatment well   ? Behavior During Therapy [facility] for tasks assessed/performed   ? ?  ?  ? ?  ? ? ?Past Medical History:  ?Diagnosis Date  ? Arthritis   ? Bradycardia   ? Cancer [facility]) 2013  ? skin cancer  ? Depression   ? ptsd  ? Dysrhythmia   ? chronic slow heart rate  ? GERD (gastroesophageal reflux disease)   ? Headache(784.0)   ? tension headaches non recent  ? History of chicken pox   ? History of kidney stones   ? passed  ? Hypertension   ? treated with HCTZ  ? Pacemaker   ? Parkinson's disease ([place])   ? dx'ed 15 years ago  ? PTSD (post-traumatic stress disorder)   ? Shortness of breath dyspnea   ? Sleep apnea   ? doesn't use C-pap  ? Varicose veins   ? ? ?Past Surgical History:  ?Procedure Laterality Date  ? CHOLECYSTECTOMY N/A 10/22/2014  ? Procedure: LAPAROSCOPIC CHOLECYSTECTOMY WITH INTRAOPERATIVE CHOLANGIOGRAM;  Surgeon: Dia Crawford III, MD;  Location: [facility] ORS;  Service: General;  Laterality: N/A;  ? cyst removed     ? from lip as a child  ? INTRAMEDULLARY (IM) NAIL INTERTROCHANTERIC N/A 02/18/2019  ? Procedure: INTRAMEDULLARY (IM) NAIL INTERTROCHANTRIC, RIGHT,;  Surgeon: Thornton Park, MD;  Location: [facility] ORS;  Service: Orthopedics;  Laterality: N/A;  ? LUMBAR LAMINECTOMY/DECOMPRESSION MICRODISCECTOMY Bilateral 12/14/2012  ? Procedure: Bilateral lumbar  three-four, four-five decompressive laminotomy/foraminotomy;  Surgeon: Charlie Pitter, MD;  Location: [place] NEURO ORS;  Service: Neurosurgery;  Laterality: Bilateral;  ? PULSE GENERATOR IMPLANT Bilateral 12/13/2013  ? Procedure: Bilateral implantable pulse generator placement;  Surgeon: Erline Levine, MD;  Location: [place] NEUR[facility] ORS;  Service: Neurosurgery;  Laterality: Bilateral;  Bilateral implantable pulse generator placement  ? skin cancer removed    ? from ears,   12 lft arm  rt leg 15  ? SUBTHALAMIC STIMULATOR BATTERY REPLACEMENT Bilateral 07/14/2017  ? Procedure: BILATERAL IMPLANTED PULSE GENERATOR CHANGE FOR DEEP BRAIN STIMULATOR;  Surgeon: Erline Levine, MD;  Location: [place];  Service: Neurosurgery;  Laterality: Bilateral;  ? SUBTHALAMIC STIMULATOR INSERTION Bilateral 12/06/2013  ? Procedure: SUBTHALAMIC STIMULATOR INSERTION;  Surgeon: Erline Levine, MD;  Location: [place] NEURO ORS;  Service: Neurosurgery;  Laterality: Bilateral;  Bilateral deep brain stimulator placement  ? ? ?There were no vitals filed for this visit. ? ? Subjective Assessment - 07/12/21 1022   ? ? Subjective  "He didn't have any falls this weekend." (per spouse)   ? Patient is accompanied by: Family member   ? Pertinent History Parkinson's Disease Diagnosed in 2000, frequent falls   ? Limitations UE/LE tremors, decreased balance, lack of coordination, intermittent diplopia (has prism glasses)   ? Patient Stated Goals Pt/spouse verbalize desire  to improve [facility] skills for holding playing cards, and improving shoulder flexibility and posture for better walker use.   ? Currently in Pain? Yes   ? Pain Score 5    ? Pain Location Back   ? Pain Orientation Lower   ? Pain Descriptors / Indicators Aching   ? Pain Type Chronic pain   ? Pain Radiating Towards low back/L hip   ? Pain Onset More than a month ago   ? Pain Frequency Intermittent   ? Aggravating Factors  early morning, later afternoon, walking, transfers   ? Pain Relieving Factors rest, heat   ? Effect of  Pain on Daily Activities difficulty with ADLs and walking   ? Multiple Pain Sites No   ? Pain Onset More than a month ago   ? ?  ?  ? ?  ? ?Occupational Therapy Treatment: ?Therapeutic Activity: ?Pt sat edge of mat to play a round of cards with tray table in front of pt, working to challenge sitting balance during functional task; feet supported, Ues unsupported. Pt set up all items for card game, including use of card shuffler, dealing cards, and setting cards up in card holders with only vc for positioning items in center to improve midline sitting.  Reminded pt/spouse to place objects in center when able, if not slightly to the L to overcompensate for R lateral lean.  Overall, pt maintained midline sitting well throughout card activity once objects were placed in midline.   ? ?Response to Treatment: ?See Plan/clinical impression below. ? ? ? OT Education - 07/12/21 1238   ? ? Education Details center supplies on table top when able to reduce lateral leaning   ? Person(s) Educated Patient;Spouse   ? Methods Explanation;Verbal cues;Demonstration   ? Comprehension Verbalized understanding;Returned demonstration;Verbal cues required;Need further instruction   ? ?  ?  ? ?  ? ? ? OT Short Term Goals - 06/21/21 1143   ? ?  ? OT SHORT TERM GOAL #1  ? Title Pt/caregiver will be indep with HEP   ? Baseline Eval: not yet initiated; Pt has been consistent to send text messages as part of his home activity program/ issued theraputty today with instruction in coordination exercises, further training needed   ? Time 6   ? Period Weeks   ? Status Partially Met   ? Target Date 06/22/21   ?  ? OT SHORT TERM GOAL #2  ? Title --   ? Baseline Eval: R 41 sec, L 41 sec; 06/21/21: R 41 sec, L 34 sec   ? Time --   ? Status On-going   ? Target Date --   ? ?  ?  ? ?  ? ? ? ? OT Long Term Goals - 06/21/21 1150   ? ?  ? OT LONG TERM GOAL #1  ? Title Pt will improve FOTO score to 40 or better to indicate increased functional improvement.   ?  Baseline Eval: FOTO 34; 06/21/21: FOTO 40   ? Time 12   ? Period Weeks   ? Status On-going   ? Target Date 08/03/21   ?  ? OT LONG TERM GOAL #2  ? Title Pt will improve R lateral pinch strength to be able to tolerate holding playing cards in R dominant hand for game of cards.   ? Baseline Eval: R lateral pinch 16 lbs; pt enjoys playing cards but is not currently playing d/t decreased strength/coordination; 06/21/21: R 25, L 19 (  pt also purchased card holder to compensate for "off" days or times)   ? Time 12   ? Period Weeks   ? Status On-going   ? Target Date 08/03/21   ?  ? OT LONG TERM GOAL #3  ? Title Pt will improve R dominant hand to mouth coordination to self feed with minimal spilling and utilize adapted utensils as needed.   ? Baseline Eval: Spouse reports eating is messy and pt struggles to keep utensil level; 06/21/21: Pt uses long handled teaspoon; OT encouraged applying foam to build up handle as pt reports it's becoming more difficult to grip.  Pt reports stuggle with hand to mouth coordination and continues to spill.   ? Time 12   ? Period Weeks   ? Status On-going   ? Target Date 08/03/21   ?  ? OT LONG TERM GOAL #4  ? Title Pt will improve bilat shoulder flex/abd for improved overhead reach during self care.   ? Baseline Eval: R shoulder flex 110, L 120, R abd 125, L 165; R shoulder flex; 06/21/21: R flex 115, L 143; R abd 144, L 170   ? Time 12   ? Period Weeks   ? Status On-going   ? Target Date 08/03/21   ?  ? OT LONG TERM GOAL #5  ? Title Pt will improve [place] in bilat hands as noted by improvement in 9 hole peg test score by 5 or more seconds to enable participate in family board games.   ? Baseline Eval: R/L 9 hole peg test 41 secs (pt has not recently participated in board games); 06/21/21: R 41 sec, L 34 sec   ? Time 12   ? Period Weeks   ? Status Partially Met   ? Target Date 08/03/21   ?  ? OT LONG TERM GOAL #6  ? Title Pt will be able to initiate simple texts and reply to texts on phone with min vc.    ? Baseline Eval: Pt struggles with texting; 06/21/21: mod vc to initiate a simple text, min-mod vc to respond   ? Time 12   ? Period Weeks   ? Status On-going   ? Target Date 08/03/21   ?  ? OT LONG TERM GOAL #

## 2021-07-12 NOTE — Therapy (Signed)
[place] ?[facility] ?[place], [place], [address] ?Phone: [phone]   Fax:  [phone] ? ?Physical Therapy Treatment/Physical Therapy Progress Note ? ? ?Dates of reporting period  05/31/21   to   07/12/21 ? ? ?Patient Details  ?Name: Oiva Dibari Lavallie ?MRN: [id number] ?Date of Birth: 29-Jul-1950 ?Referring Provider (PT): Elsie Stain, MD ? ? ?Encounter Date: 07/12/2021 ? ? PT End of Session - 07/12/21 1105   ? ? Visit Number 50   ? Number of Visits 62   ? Date for PT Re-Evaluation 08/23/21   ? Authorization Type [employer] Medicare Choice PPO   ? Progress Note Due on Visit 50   ? PT Start Time 1100   ? PT Stop Time 1145   ? PT Time Calculation (min) 45 min   ? Equipment Utilized During Treatment Gait belt   ? Activity Tolerance Patient tolerated treatment well   ? Behavior During Therapy [facility] for tasks assessed/performed   ? ?  ?  ? ?  ? ? ?Past Medical History:  ?Diagnosis Date  ? Arthritis   ? Bradycardia   ? Cancer [facility]) 2013  ? skin cancer  ? Depression   ? ptsd  ? Dysrhythmia   ? chronic slow heart rate  ? GERD (gastroesophageal reflux disease)   ? Headache(784.0)   ? tension headaches non recent  ? History of chicken pox   ? History of kidney stones   ? passed  ? Hypertension   ? treated with HCTZ  ? Pacemaker   ? Parkinson's disease ([place])   ? dx'ed 15 years ago  ? PTSD (post-traumatic stress disorder)   ? Shortness of breath dyspnea   ? Sleep apnea   ? doesn't use C-pap  ? Varicose veins   ? ? ?Past Surgical History:  ?Procedure Laterality Date  ? CHOLECYSTECTOMY N/A 10/22/2014  ? Procedure: LAPAROSCOPIC CHOLECYSTECTOMY WITH INTRAOPERATIVE CHOLANGIOGRAM;  Surgeon: Dia Crawford III, MD;  Location: [facility] ORS;  Service: General;  Laterality: N/A;  ? cyst removed     ? from lip as a child  ? INTRAMEDULLARY (IM) NAIL INTERTROCHANTERIC N/A 02/18/2019  ? Procedure: INTRAMEDULLARY (IM) NAIL INTERTROCHANTRIC, RIGHT,;  Surgeon: Thornton Park, MD;  Location: [facility] ORS;   Service: Orthopedics;  Laterality: N/A;  ? LUMBAR LAMINECTOMY/DECOMPRESSION MICRODISCECTOMY Bilateral 12/14/2012  ? Procedure: Bilateral lumbar three-four, four-five decompressive laminotomy/foraminotomy;  Surgeon: Charlie Pitter, MD;  Location: [place] [facility];  Service: Neurosurgery;  Laterality: Bilateral;  ? PULSE GENERATOR IMPLANT Bilateral 12/13/2013  ? Procedure: Bilateral implantable pulse generator placement;  Surgeon: Erline Levine, MD;  Location: [place] [facility];  Service: Neurosurgery;  Laterality: Bilateral;  Bilateral implantable pulse generator placement  ? skin cancer removed    ? from ears,   12 lft arm  rt leg 15  ? SUBTHALAMIC STIMULATOR BATTERY REPLACEMENT Bilateral 07/14/2017  ? Procedure: BILATERAL IMPLANTED PULSE GENERATOR CHANGE FOR DEEP BRAIN STIMULATOR;  Surgeon: Erline Levine, MD;  Location: [place];  Service: Neurosurgery;  Laterality: Bilateral;  ? SUBTHALAMIC STIMULATOR INSERTION Bilateral 12/06/2013  ? Procedure: SUBTHALAMIC STIMULATOR INSERTION;  Surgeon: Erline Levine, MD;  Location: [place] [facility]O ORS;  Service: Neurosurgery;  Laterality: Bilateral;  Bilateral deep brain stimulator placement  ? ? ?There were no vitals filed for this visit. ? ? Subjective Assessment - 07/12/21 1104   ? ? Subjective Pt reports significant pain in back yesterday and stating it was a "bad" day but is is feeling better today. Reports no  falls since last visit.   ? Patient is accompained by: Family member   ? Pertinent History Fidencio Duddy is a 31yoM who comes to [facility] OPPT neuro for evaluation of conitnued difficulty of imbalance, unsteadiness on feet, and generally limited tolerance to mobility in the setting of chronic parkinsonism. SInce DC in February, pt has had PPM placement due to bradycardia issues. Pt also started using an UpWalker device 2 week prior to this evaluation, goal was to help with gradual melting of postural extension that occured with sustained upright actiivty. Pt has continued to practice supervised AMB  with caregivers, short distances out of house and in community. Pt continues to experience frequent falls on a weekly basis, at a rate that both he and wife report to be increasing in frequency. PMH: HTN, bradycardia s/p PPM, PD, DBS, COVID-19 infection, chronic low back pain s/p L4/5 surgery.   ? Limitations Walking;Standing;Sitting;House hold activities;Lifting   ? How long can you sit comfortably? Not limited   ? How long can you stand comfortably? 5 minutes with support   ? How long can you walk comfortably? ~5-6 minutes [name]   ? Patient Stated Goals Reduce falls, improve AMB tolerance   ? Pain Onset More than a month ago   ? ?  ?  ? ?  ? ? ? ? ? [facility] PT Assessment - 07/12/21 0001   ? ?  ? Berg Balance Test  ? Sit to Stand Able to stand without using hands and stabilize independently   ? Standing Unsupported Able to stand safely 2 minutes   ? Sitting with Back Unsupported but Feet Supported on Floor or Stool Able to sit safely and securely 2 minutes   ? Stand to Sit Sits safely with minimal use of hands   ? Transfers Able to transfer safely, definite need of hands   ? Standing Unsupported with Eyes Closed Able to stand 10 seconds with supervision   ? Standing Unsupported with Feet Together Able to place feet together independently and stand for 1 minute with supervision   ? From Standing, Reach Forward with Outstretched Arm Can reach forward >12 cm safely (5")   ? From Standing Position, Pick up Object from [place] to pick up shoe safely and easily   ? From Standing Position, Turn to Look Behind Over each Shoulder Looks behind from both sides and weight shifts well   ? Turn 360 Degrees Able to turn 360 degrees safely but slowly   ? Standing Unsupported, Alternately Place Feet on Step/Stool Able to stand independently and complete 8 steps >20 seconds   ? Standing Unsupported, One Foot in Front Able to take small step independently and hold 30 seconds   ? Standing on One Leg Able to lift leg  independently and hold equal to or more than 3 seconds   ? Total Score 45   ? ?  ?  ? ?  ? ?Physical therapy treatment session today consisted of completing assessment of goals and administration of testing as demonstrated in flow sheet and goals section of this exam. Addition treatments may be found below.  ? ? ?  PWR Step and PWR! Up ?-X10 each with standby assist from physical therapist.  Cues for large step with right PWR! step activity. ? ?With 6-minute walk test around minute 4 patient demonstrated decreased foot clearance bilaterally.   ? ?With Berg balance test patient did not report fatigue but demonstrated obvious signs of fatigue in his lower extremities  with tandem balance and with increased fatigue patient's balance and safety decrease significantly.  Patient was instructed to technologist fatigue and monitor to prevent falls and loss of balance. ? ? ? ? ? ? ? ? ? ? ? ? ? ? ? ? ? ? ? ? ? ? PT Education - 07/12/21 1218   ? ? Education provided Yes   ? Education Details Progress with therapy   ? Person(s) Educated Patient;Spouse   ? Methods Explanation   ? Comprehension Verbalized understanding   ? ?  ?  ? ?  ? ? ? PT Short Term Goals - 07/12/21 1107   ? ?  ? PT SHORT TERM GOAL #1  ? Title Pt to demonstrate improved sustained AMB tolerance >821f.   ? Baseline At evlauation: 6041ftolerance (not to failure); 9/26: [id number]   ? Time 4   ? Period Weeks   ? Status Achieved   ? Target Date 01/05/21   ?  ? PT SHORT TERM GOAL #2  ? Title Pt will improve BERG balance score to greater than 45 to indicate decrease risk of falls.   ? Baseline 2/13:44   ? Time 4   ? Period Weeks   ? Status Achieved   ? Target Date 06/28/21   ?  ? PT SHORT TERM GOAL #3  ? Title Pt to demonstrate 10MWT<13sec to show self-selected gait speed appropriate for AMB outside of house and to improve energy efficiency.   ? Baseline UpWalker Eval: 17.53sec; 9/26: 0.74 m/s (13.5)11/21: 13.68 (.73 m/s) 12.64 on 12/28 9.88 pn2/13   ? Time 4   ?  Period Weeks   ? Status Achieved   ? Target Date 02/08/21   ? ?  ?  ? ?  ? ? ? ? PT Long Term Goals - 07/12/21 1125   ? ?  ? PT LONG TERM GOAL #1  ? Title Pt will improve BERG balance score to greater than 47 to

## 2021-07-13 ENCOUNTER — Ambulatory Visit (INDEPENDENT_AMBULATORY_CARE_PROVIDER_SITE_OTHER): Payer: Medicare PPO | Admitting: Psychology

## 2021-07-13 DIAGNOSIS — F4323 Adjustment disorder with mixed anxiety and depressed mood: Secondary | ICD-10-CM | POA: Diagnosis not present

## 2021-07-13 DIAGNOSIS — F331 Major depressive disorder, recurrent, moderate: Secondary | ICD-10-CM

## 2021-07-13 NOTE — Progress Notes (Signed)
[facility] Counselor/Therapist Progress Note ? ?Patient ID: [name], MRN: [id number],   ? ?Date: 07/13/2021 ? ?Time Spent: 45 minutes ? ?Treatment Type: Individual Therapy ? ?Reported Symptoms: sadness, and boredom ? ?Mental Status Exam: ?Appearance:  Casual     ?Behavior: Drowsy  ?Motor: Psychomotor Retardation  ?Speech/Language:  Slow  ?Affect: Blunt  ?Mood: normal  ?Thought process: concrete  ?Thought content:   WNL  ?Sensory/Perceptual disturbances:   WNL  ?Orientation: oriented to person, place, time/date, and situation  ?Attention: Fair  ?Concentration: Fair  ?Memory: Immediate;   Fair  ?Fund of knowledge:  Fair  ?Insight:   Fair  ?Judgment:  Fair  ?Impulse Control: Fair  ? ?Risk Assessment: ?Danger to Self:  No ?Self-injurious Behavior: No ?Danger to Others: No ?Duty to Warn:no ?Physical Aggression / Violence:No  ?Access to Firearms a concern: No  ?Gang Involvement:No  ? ?Subjective: The patient attended a face-to-face individual therapy session via video visit today.  The patient gave verbal consent for the video to be on WebEx.  The patient was in his home alone and therapist was in the office.  The patient presents with a flat affect and mood is pleasant.  The patient reports that his wife is at an appointment today.  We talked today about things that the patient could do to make his life more meaningful.  The patient states that he feels like he is trapped at home.  We talked about him having a conversation with his wife and letting her know what he would like to do.  We brainstormed some ideas of things that he might could do that would be different than sitting at home and watching TV.  I encouraged him to consider going for a ride to the mountains or to the beach.  They are going to the beach in a few weeks to see his daughter graduate from a program that she is in.  The patient states that he has to go to court in April for his disability hearing. ? ? Interventions:  Ego-Supportive, CBT ? ?Diagnosis:Major depressive disorder, recurrent episode, moderate ([place]) ? ?Adjustment disorder with mixed anxiety and depressed mood ? ?Plan: Please see Treatment plan in Therapy Charts with target date of 04/30/2022 for goals and progress towards goals.  Plan is to continue to offer supportive therapy to patient to help him deal with his depression related to his Parkinsons diagnosis. ? ?Patient approved Treatment Plan in Therapy Charts. ? ?Blythe Hartshorn G Ashly Yepez, LCSW ? ? ? ? ? ? ? ? ? ? ? ? ? ? ? ? ? ?Shadana Pry G Olaf [place]a, LCSW ? ? ? ? ? ? ? ? ? ? ? ? ? ? ?Riad Wagley G Rogerick Baldwin, LCSW ? ? ? ? ? ? ? ? ? ? ? ? ? ? ?Keitra Carusone G Alasdair Kleve, LCSW ? ? ? ? ? ? ? ? ? ? ? ? ? ? ?Narely Nobles G Jayah Balthazar, LCSW ?

## 2021-07-14 ENCOUNTER — Other Ambulatory Visit: Payer: Self-pay

## 2021-07-14 ENCOUNTER — Encounter: Payer: Self-pay | Admitting: Physical Therapy

## 2021-07-14 ENCOUNTER — Encounter: Payer: Medicare PPO | Admitting: Speech Pathology

## 2021-07-14 ENCOUNTER — Ambulatory Visit: Payer: Medicare PPO

## 2021-07-14 ENCOUNTER — Ambulatory Visit: Payer: Medicare PPO | Admitting: Physical Therapy

## 2021-07-14 DIAGNOSIS — R262 Difficulty in walking, not elsewhere classified: Secondary | ICD-10-CM

## 2021-07-14 DIAGNOSIS — G20 Parkinson's disease: Secondary | ICD-10-CM

## 2021-07-14 DIAGNOSIS — R296 Repeated falls: Secondary | ICD-10-CM

## 2021-07-14 DIAGNOSIS — M6281 Muscle weakness (generalized): Secondary | ICD-10-CM

## 2021-07-14 DIAGNOSIS — R278 Other lack of coordination: Secondary | ICD-10-CM

## 2021-07-14 DIAGNOSIS — R269 Unspecified abnormalities of gait and mobility: Secondary | ICD-10-CM | POA: Diagnosis not present

## 2021-07-14 DIAGNOSIS — R2681 Unsteadiness on feet: Secondary | ICD-10-CM | POA: Diagnosis not present

## 2021-07-14 NOTE — Therapy (Signed)
[place] ?[facility] ?[place], [place], [address] ?Phone: [phone]   Fax:  [phone] ? ?Physical Therapy Treatment ? ?Patient Details  ?Name: [name] ?MRN: [id number] ?Date of Birth: 06-08-1950 ?Referring Provider (PT): Elsie Stain, MD ? ? ?Encounter Date: 07/14/2021 ? ? PT End of Session - 07/14/21 1105   ? ? Visit Number 51   ? Number of Visits 62   ? Date for PT Re-Evaluation 08/23/21   ? Authorization Type [employer] Medicare Choice PPO   ? Progress Note Due on Visit 50   ? PT Start Time 1100   ? PT Stop Time 1145   ? PT Time Calculation (min) 45 min   ? Equipment Utilized During Treatment Gait belt   ? Activity Tolerance Patient tolerated treatment well   ? Behavior During Therapy [facility] for tasks assessed/performed   ? ?  ?  ? ?  ? ? ?Past Medical History:  ?Diagnosis Date  ? Arthritis   ? Bradycardia   ? Cancer [facility]) 2013  ? skin cancer  ? Depression   ? ptsd  ? Dysrhythmia   ? chronic slow heart rate  ? GERD (gastroesophageal reflux disease)   ? Headache(784.0)   ? tension headaches non recent  ? History of chicken pox   ? History of kidney stones   ? passed  ? Hypertension   ? treated with HCTZ  ? Pacemaker   ? Parkinson's disease ([place])   ? dx'ed 15 years ago  ? PTSD (post-traumatic stress disorder)   ? Shortness of breath dyspnea   ? Sleep apnea   ? doesn't use C-pap  ? Varicose veins   ? ? ?Past Surgical History:  ?Procedure Laterality Date  ? CHOLECYSTECTOMY N/A 10/22/2014  ? Procedure: LAPAROSCOPIC CHOLECYSTECTOMY WITH INTRAOPERATIVE CHOLANGIOGRAM;  Surgeon: Dia Crawford III, MD;  Location: [facility] ORS;  Service: General;  Laterality: N/A;  ? cyst removed     ? from lip as a child  ? INTRAMEDULLARY (IM) NAIL INTERTROCHANTERIC N/A 02/18/2019  ? Procedure: INTRAMEDULLARY (IM) NAIL INTERTROCHANTRIC, RIGHT,;  Surgeon: Thornton Park, MD;  Location: [facility] ORS;  Service: Orthopedics;  Laterality: N/A;  ? LUMBAR LAMINECTOMY/DECOMPRESSION  MICRODISCECTOMY Bilateral 12/14/2012  ? Procedure: Bilateral lumbar three-four, four-five decompressive laminotomy/foraminotomy;  Surgeon: Charlie Pitter, MD;  Location: [place] NE[facility] ORS;  Service: Neurosurgery;  Laterality: Bilateral;  ? PULSE GENERATOR IMPLANT Bilateral 12/13/2013  ? Procedure: Bilateral implantable pulse generator placement;  Surgeon: Erline Levine, MD;  Location: [place] NE[facility] ORS;  Service: Neurosurgery;  Laterality: Bilateral;  Bilateral implantable pulse generator placement  ? skin cancer removed    ? from ears,   12 lft arm  rt leg 15  ? SUBTHALAMIC STIMULATOR BATTERY REPLACEMENT Bilateral 07/14/2017  ? Procedure: BILATERAL IMPLANTED PULSE GENERATOR CHANGE FOR DEEP BRAIN STIMULATOR;  Surgeon: Erline Levine, MD;  Location: [place];  Service: Neurosurgery;  Laterality: Bilateral;  ? SUBTHALAMIC STIMULATOR INSERTION Bilateral 12/06/2013  ? Procedure: SUBTHALAMIC STIMULATOR INSERTION;  Surgeon: Erline Levine, MD;  Location: [place] NE[facility] ORS;  Service: Neurosurgery;  Laterality: Bilateral;  Bilateral deep brain stimulator placement  ? ? ?There were no vitals filed for this visit. ? ? Subjective Assessment - 07/14/21 1104   ? ? Subjective Pt reports no fals since last session. Pt report she still has back pain at this time and it is feeling like it goes " all the way across" his low back region.   ? Patient is accompained by:  Family member   ? Pertinent History [name] is a 26yoM who comes to [facility] OPPT neuro for evaluation of conitnued difficulty of imbalance, unsteadiness on feet, and generally limited tolerance to mobility in the setting of chronic parkinsonism. SInce DC in February, pt has had PPM placement due to bradycardia issues. Pt also started using an UpWalker device 2 week prior to this evaluation, goal was to help with gradual melting of postural extension that occured with sustained upright actiivty. Pt has continued to practice supervised AMB with caregivers, short distances out of house and in  community. Pt continues to experience frequent falls on a weekly basis, at a rate that both he and wife report to be increasing in frequency. PMH: HTN, bradycardia s/p PPM, PD, DBS, COVID-19 infection, chronic low back pain s/p L4/5 surgery.   ? Limitations Walking;Standing;Sitting;House hold activities;Lifting   ? How long can you sit comfortably? Not limited   ? How long can you stand comfortably? 5 minutes with support   ? How long can you walk comfortably? ~5-6 minutes [name]   ? Patient Stated Goals Reduce falls, improve AMB tolerance   ? Currently in Pain? Yes   ? Pain Score 3    ? Pain Location Back   ? Pain Orientation Lower   ? Pain Descriptors / Indicators Aching   ? Pain Onset More than a month ago   ? ?  ?  ? ?  ? ? ? ? ?Exercise/Activity Sets/Reps/Time/ Resistance Assistance Charge type Comments- Unless otherwise stated, CGA was provided and gait belt donned in order to ensure pt safety ?  ?Leg press  *10'@85'$ # ?2*10'@100'$ # ?*8'@115'$  Set up  therex Assistance for LE positioning throughout   ?SLS progression with ball hand off -  ?1 LE airex, 1 LE on step  X 10 with ea LE on airex  SGA NMR No Lob noted this session, good corrections when COM moves toward edge of BOS   ?Stair taps to 6 inch step with TB in between feet for external cue to prevent NBOS and with shoulder horizontal ABD for larger amplitude movement  2 x 10 x ea LE CGA, No UE A this session  NMR Cues for large amplitude movement, " buzzer" when pt hit green TB for knowledge of performance throughout.  ?No "catching" of foot on step this session   ?      ?LAQ 2 x 10 x 7# on ea LE   There ex To improve knee extension strength  ?External cues for full knee extension ROM.   ?Ball roll outs  3 way 10 x 5 sec holds   Therex  To improve spinal mobility and discomfort in low back area   ?      ?      ?PRW! Step to airex pad ( external cue for step length)  2 x 10   CGA  Therex  Pad for increased balance challenge as well as for external cue for  step length. Cues for proper UE movement.  ?- Missed pad and had trip on one attempt, able to correct with stepping pattern, CGA was provided for safety throughout  ?PWR! Up standing  2 x 10  CGA There ex  Visual and verbal cues throughout, improved performance when mirroring PT  ?Good coordination and balance with task following intial cues for proper performance   ?      ?Treatment Provided this session  ? ?Pt educated throughout session about proper posture and  technique with exercises. Improved exercise technique, movement at target joints, use of target muscles after min to mod verbal, visual, tactile cues. ? ?Note: Portions of this document were prepared using Dragon voice recognition software and although reviewed may contain unintentional dictation errors in syntax, grammar, or spelling. ? ? ? ? ? ? ? ? ? ? ? ? ? ? ? ? ? ? ? ? ? ? ? ? ? ? ? PT Education - 07/14/21 1104   ? ? Education provided Yes   ? Education Details progress iwth therapy   ? Person(s) Educated Patient   ? Methods Explanation   ? Comprehension Verbalized understanding   ? ?  ?  ? ?  ? ? ? PT Short Term Goals - 07/12/21 1107   ? ?  ? PT SHORT TERM GOAL #1  ? Title Pt to demonstrate improved sustained AMB tolerance >866f.   ? Baseline At evlauation: 6074ftolerance (not to failure); 9/26: 135830t   ? Time 4   ? Period Weeks   ? Status Achieved   ? Target Date 01/05/21   ?  ? PT SHORT TERM GOAL #2  ? Title Pt will improve BERG balance score to greater than 45 to indicate decrease risk of falls.   ? Baseline 2/13:44   ? Time 4   ? Period Weeks   ? Status Achieved   ? Target Date 06/28/21   ?  ? PT SHORT TERM GOAL #3  ? Title Pt to demonstrate 10MWT<13sec to show self-selected gait speed appropriate for AMB outside of house and to improve energy efficiency.   ? Baseline UpWalker Eval: 17.53sec; 9/26: 0.74 m/s (13.5)11/21: 13.68 (.73 m/s) 12.64 on 12/28 9.88 pn2/13   ? Time 4   ? Period Weeks   ? Status Achieved   ? Target Date 02/08/21   ? ?   ?  ? ?  ? ? ? ? PT Long Term Goals - 07/12/21 1125   ? ?  ? PT LONG TERM GOAL #1  ? Title Pt will improve BERG balance score to greater than 47 to indicate decreaed risk of falls.   ? Baseline 41 on 12/28 44 on 2/13 45 on

## 2021-07-14 NOTE — Therapy (Signed)
[place] [facility] ?[place], [place], [address] ?Phone: [phone]   Fax:  [phone] ? ?Occupational Therapy Treatment ? ?Patient Details  ?Name: [name] ?MRN: [id number] ?Date of Birth: [birth year] ?Referring Provider (OT): Dr. Sharolyn Douglas (neurologist at [facility]) ? ? ?Encounter Date: 07/14/2021 ? ? OT End of Session - 07/14/21 2118   ? ? Visit Number 16   ? Number of Visits 24   ? Date for OT Re-Evaluation 08/03/21   ? OT Start Time 1145   ? OT Stop Time 1230   ? OT Time Calculation (min) 45 min   ? Equipment Utilized During Treatment rollator   ? Activity Tolerance Patient tolerated treatment well   ? Behavior During Therapy [facility] for tasks assessed/performed   ? ?  ?  ? ?  ? ? ?Past Medical History:  ?Diagnosis Date  ? Arthritis   ? Bradycardia   ? Cancer [facility]) 2013  ? skin cancer  ? Depression   ? ptsd  ? Dysrhythmia   ? chronic slow heart rate  ? GERD (gastroesophageal reflux disease)   ? Headache(784.0)   ? tension headaches non recent  ? History of chicken pox   ? History of kidney stones   ? passed  ? Hypertension   ? treated with HCTZ  ? Pacemaker   ? Parkinson's disease ([place])   ? dx'ed 15 years ago  ? PTSD (post-traumatic stress disorder)   ? Shortness of breath dyspnea   ? Sleep apnea   ? doesn't use C-pap  ? Varicose veins   ? ? ?Past Surgical History:  ?Procedure Laterality Date  ? CHOLECYSTECTOMY N/A 10/22/2014  ? Procedure: LAPAROSCOPIC CHOLECYSTECTOMY WITH INTRAOPERATIVE CHOLANGIOGRAM;  Surgeon: Dia Crawford III, MD;  Location: [facility] ORS;  Service: General;  Laterality: N/A;  ? cyst removed     ? from lip as a child  ? INTRAMEDULLARY (IM) NAIL INTERTROCHANTERIC N/A 02/18/2019  ? Procedure: INTRAMEDULLARY (IM) NAIL INTERTROCHANTRIC, RIGHT,;  Surgeon: Thornton Park, MD;  Location: [facility] ORS;  Service: Orthopedics;  Laterality: N/A;  ? LUMBAR LAMINECTOMY/DECOMPRESSION MICRODISCECTOMY Bilateral 12/14/2012  ? Procedure: Bilateral lumbar  three-four, four-five decompressive laminotomy/foraminotomy;  Surgeon: Charlie Pitter, MD;  Location: [place] [facility] ORS;  Service: Neurosurgery;  Laterality: Bilateral;  ? PULSE GENERATOR IMPLANT Bilateral 12/13/2013  ? Procedure: Bilateral implantable pulse generator placement;  Surgeon: Erline Levine, MD;  Location: [place] [facility]S;  Service: Neurosurgery;  Laterality: Bilateral;  Bilateral implantable pulse generator placement  ? skin cancer removed    ? from ears,   12 lft arm  rt leg 15  ? SUBTHALAMIC STIMULATOR BATTERY REPLACEMENT Bilateral 07/14/2017  ? Procedure: BILATERAL IMPLANTED PULSE GENERATOR CHANGE FOR DEEP BRAIN STIMULATOR;  Surgeon: Erline Levine, MD;  Location: [place];  Service: Neurosurgery;  Laterality: Bilateral;  ? SUBTHALAMIC STIMULATOR INSERTION Bilateral 12/06/2013  ? Procedure: SUBTHALAMIC STIMULATOR INSERTION;  Surgeon: Erline Levine, MD;  Location: [place] NEUR[facility]S;  Service: Neurosurgery;  Laterality: Bilateral;  Bilateral deep brain stimulator placement  ? ? ?There were no vitals filed for this visit. ? ? Subjective Assessment - 07/14/21 2112   ? ? Subjective  "He wants to be able to use his calendar more on his phone." (per spouse)   ? Patient is accompanied by: Family member   ? Pertinent History Parkinson's Disease Diagnosed in 2000, frequent falls   ? Limitations UE/LE tremors, decreased balance, lack of coordination, intermittent diplopia (has prism glasses)   ?  Patient Stated Goals Pt/spouse verbalize desire to improve [facility] skills for holding playing cards, and improving shoulder flexibility and posture for better walker use.   ? Currently in Pain? Yes   ? Pain Score 3    ? Pain Location Back   ? Pain Orientation Lower   ? Pain Descriptors / Indicators Aching   ? Pain Type Chronic pain   ? Pain Radiating Towards low back/L hip   ? Pain Onset More than a month ago   ? Pain Frequency Intermittent   ? Aggravating Factors  early morning, later afternoon, walking, transfers   ? Pain Relieving Factors rest,  heat   ? Effect of Pain on Daily Activities difficulty with ADLs and walking   ? Multiple Pain Sites No   ? Pain Onset More than a month ago   ? ?  ?  ? ?  ? ?Occupational Therapy Treatment: ?Therapeutic Exercise: ?Performed passive shoulder ROM all planes bilaterally, working to decrease stiffness for UB ADLs.  Instructed pt in 2 lb dowel exercises for shoulder flex, abd, ER behind head, working to increase strength and flexibility in shoulders for UB ADLs; completed 2 sets 10 reps each with verbal cues for achieving max range and minimizing compensatory movements.  ? ?Neuro re-ed: ?Facilitated [place] bilaterally for improving manipulation of ADL supplies.  Pt worked with grooved pegs, picking up from dish and table top without non skid surface, placing in pegboard, removing from pegboard and storing in palms before discarding them 1 at a time.  Pt required increased time in both hands for lining up grooves to place pegs in board.  Frequent dropping with storage and discarding of pegs in palm, specifically when using R hand. ? ?Response to Treatment: ?See Plan/clinical impression below. ? ? ? ? OT Education - 07/14/21 2117   ? ? Education Details BUE exercises, [place] skilss   ? Person(s) Educated Patient;Spouse   ? Methods Explanation;Verbal cues;Demonstration   ? Comprehension Verbalized understanding;Returned demonstration;Verbal cues required;Need further instruction   ? ?  ?  ? ?  ? ? ? OT Short Term Goals - 06/21/21 1143   ? ?  ? OT SHORT TERM GOAL #1  ? Title Pt/caregiver will be indep with HEP   ? Baseline Eval: not yet initiated; Pt has been consistent to send text messages as part of his home activity program/ issued theraputty today with instruction in coordination exercises, further training needed   ? Time 6   ? Period Weeks   ? Status Partially Met   ? Target Date 06/22/21   ?  ? OT SHORT TERM GOAL #2  ? Title --   ? Baseline Eval: R 41 sec, L 41 sec; 06/21/21: R 41 sec, L 34 sec   ? Time --   ? Status  On-going   ? Target Date --   ? ?  ?  ? ?  ? ? ? ? OT Long Term Goals - 06/21/21 1150   ? ?  ? OT LONG TERM GOAL #1  ? Title Pt will improve FOTO score to 40 or better to indicate increased functional improvement.   ? Baseline Eval: FOTO 34; 06/21/21: FOTO 40   ? Time 12   ? Period Weeks   ? Status On-going   ? Target Date 08/03/21   ?  ? OT LONG TERM GOAL #2  ? Title Pt will improve R lateral pinch strength to be able to tolerate holding playing cards in R  dominant hand for game of cards.   ? Baseline Eval: R lateral pinch 16 lbs; pt enjoys playing cards but is not currently playing d/t decreased strength/coordination; 06/21/21: R 25, L 19 (pt also purchased card holder to compensate for "off" days or times)   ? Time 12   ? Period Weeks   ? Status On-going   ? Target Date 08/03/21   ?  ? OT LONG TERM GOAL #3  ? Title Pt will improve R dominant hand to mouth coordination to self feed with minimal spilling and utilize adapted utensils as needed.   ? Baseline Eval: Spouse reports eating is messy and pt struggles to keep utensil level; 06/21/21: Pt uses long handled teaspoon; OT encouraged applying foam to build up handle as pt reports it's becoming more difficult to grip.  Pt reports stuggle with hand to mouth coordination and continues to spill.   ? Time 12   ? Period Weeks   ? Status On-going   ? Target Date 08/03/21   ?  ? OT LONG TERM GOAL #4  ? Title Pt will improve bilat shoulder flex/abd for improved overhead reach during self care.   ? Baseline Eval: R shoulder flex 110, L 120, R abd 125, L 165; R shoulder flex; 06/21/21: R flex 115, L 143; R abd 144, L 170   ? Time 12   ? Period Weeks   ? Status On-going   ? Target Date 08/03/21   ?  ? OT LONG TERM GOAL #5  ? Title Pt will improve [place] in bilat hands as noted by improvement in 9 hole peg test score by 5 or more seconds to enable participate in family board games.   ? Baseline Eval: R/L 9 hole peg test 41 secs (pt has not recently participated in board games); 06/21/21:  R 41 sec, L 34 sec   ? Time 12   ? Period Weeks   ? Status Partially Met   ? Target Date 08/03/21   ?  ? OT LONG TERM GOAL #6  ? Title Pt will be able to initiate simple texts and reply to texts on phone with min

## 2021-07-19 ENCOUNTER — Ambulatory Visit: Payer: Medicare PPO | Attending: Family Medicine | Admitting: Physical Therapy

## 2021-07-19 ENCOUNTER — Encounter: Payer: Self-pay | Admitting: Physical Therapy

## 2021-07-19 ENCOUNTER — Telehealth: Payer: Self-pay

## 2021-07-19 ENCOUNTER — Ambulatory Visit: Payer: Medicare PPO

## 2021-07-19 DIAGNOSIS — G20 Parkinson's disease: Secondary | ICD-10-CM | POA: Diagnosis not present

## 2021-07-19 DIAGNOSIS — R2681 Unsteadiness on feet: Secondary | ICD-10-CM | POA: Diagnosis not present

## 2021-07-19 DIAGNOSIS — R269 Unspecified abnormalities of gait and mobility: Secondary | ICD-10-CM | POA: Insufficient documentation

## 2021-07-19 DIAGNOSIS — R278 Other lack of coordination: Secondary | ICD-10-CM

## 2021-07-19 DIAGNOSIS — R262 Difficulty in walking, not elsewhere classified: Secondary | ICD-10-CM | POA: Diagnosis not present

## 2021-07-19 DIAGNOSIS — R296 Repeated falls: Secondary | ICD-10-CM | POA: Diagnosis not present

## 2021-07-19 DIAGNOSIS — M6281 Muscle weakness (generalized): Secondary | ICD-10-CM | POA: Diagnosis not present

## 2021-07-19 NOTE — Therapy (Signed)
[place] [facility] ?[place], [place], [address] ?Phone: [phone]   Fax:  [phone] ? ?Physical Therapy Treatment ? ?Patient Details  ?Name: [name] ?MRN: [id number] ?Date of Birth: [birth year] ?Referring Provider (PT): George Stain, MD ? ? ?Encounter Date: 07/19/2021 ? ? PT End of Session - 07/19/21 1438   ? ? Visit Number 56   ? Number of Visits 62   ? Date for PT Re-Evaluation 08/23/21   ? Authorization Type [employer] Medicare Choice PPO   ? Progress Note Due on Visit 50   ? PT Start Time 1430   ? PT Stop Time 1515   ? PT Time Calculation (min) 45 min   ? Equipment Utilized During Treatment Gait belt   ? Activity Tolerance Patient tolerated treatment well   ? Behavior During Therapy [facility] for tasks assessed/performed   ? ?  ?  ? ?  ? ? ?Past Medical History:  ?Diagnosis Date  ? Arthritis   ? Bradycardia   ? Cancer [facility]) 2013  ? skin cancer  ? Depression   ? ptsd  ? Dysrhythmia   ? chronic slow heart rate  ? GERD (gastroesophageal reflux disease)   ? Headache(784.0)   ? tension headaches non recent  ? History of chicken pox   ? History of kidney stones   ? passed  ? Hypertension   ? treated with HCTZ  ? Pacemaker   ? Parkinson's disease ([place])   ? dx'ed 15 years ago  ? PTSD (post-traumatic stress disorder)   ? Shortness of breath dyspnea   ? Sleep apnea   ? doesn't use C-pap  ? Varicose veins   ? ? ?Past Surgical History:  ?Procedure Laterality Date  ? CHOLECYSTECTOMY N/A 10/22/2014  ? Procedure: LAPAROSCOPIC CHOLECYSTECTOMY WITH INTRAOPERATIVE CHOLANGIOGRAM;  Surgeon: George Crawford III, MD;  Location: [facility] ORS;  Service: General;  Laterality: N/A;  ? cyst removed     ? from lip as a child  ? INTRAMEDULLARY (IM) NAIL INTERTROCHANTERIC N/A 02/18/2019  ? Procedure: INTRAMEDULLARY (IM) NAIL INTERTROCHANTRIC, RIGHT,;  Surgeon: George Park, MD;  Location: [facility] ORS;  Service: Orthopedics;  Laterality: N/A;  ? LUMBAR LAMINECTOMY/DECOMPRESSION MICRODISCECTOMY  Bilateral 12/14/2012  ? Procedure: Bilateral lumbar three-four, four-five decompressive laminotomy/foraminotomy;  Surgeon: George Pitter, MD;  Location: [place] [facility];  Service: Neurosurgery;  Laterality: Bilateral;  ? PULSE GENERATOR IMPLANT Bilateral 12/13/2013  ? Procedure: Bilateral implantable pulse generator placement;  Surgeon: George Levine, MD;  Location: [place] NEURO ORS;  Service: Neurosurgery;  Laterality: Bilateral;  Bilateral implantable pulse generator placement  ? skin cancer removed    ? from ears,   12 lft arm  rt leg 15  ? SUBTHALAMIC STIMULATOR BATTERY REPLACEMENT Bilateral 07/14/2017  ? Procedure: BILATERAL IMPLANTED PULSE GENERATOR CHANGE FOR DEEP BRAIN STIMULATOR;  Surgeon: George Levine, MD;  Location: [place];  Service: Neurosurgery;  Laterality: Bilateral;  ? SUBTHALAMIC STIMULATOR INSERTION Bilateral 12/06/2013  ? Procedure: SUBTHALAMIC STIMULATOR INSERTION;  Surgeon: George Levine, MD;  Location: [place] NEURO ORS;  Service: Neurosurgery;  Laterality: Bilateral;  Bilateral deep brain stimulator placement  ? ? ?There were no vitals filed for this visit. ? ? Subjective Assessment - 07/19/21 1325   ? ? Subjective Pt reports no fals since last session. Pt report she still has back pain at this time and it is feeling like it goes " all the way across" his low back region.   ? Patient is accompained by:  Family member   ? Pertinent History [name] is a 7yoM who comes to [facility] neuro for evaluation of conitnued difficulty of imbalance, unsteadiness on feet, and generally limited tolerance to mobility in the setting of chronic parkinsonism. SInce DC in February, pt has had PPM placement due to bradycardia issues. Pt also started using an UpWalker device 2 week prior to this evaluation, goal was to help with gradual melting of postural extension that occured with sustained upright actiivty. Pt has continued to practice supervised AMB with caregivers, short distances out of house and in community. Pt  continues to experience frequent falls on a weekly basis, at a rate that both he and wife report to be increasing in frequency. PMH: HTN, bradycardia s/p PPM, PD, DBS, COVID-19 infection, chronic low back pain s/p L4/5 surgery.   ? Limitations Walking;Standing;Sitting;House hold activities;Lifting   ? How long can you sit comfortably? Not limited   ? How long can you stand comfortably? 5 minutes with support   ? How long can you walk comfortably? ~5-6 minutes [name]   ? Patient Stated Goals Reduce falls, improve AMB tolerance   ? Pain Onset More than a month ago   ? ?  ?  ? ?  ? ? ? ? ? ?Exercise/Activity Sets/Reps/Time/ Resistance Assistance Charge type Comments- Unless otherwise stated, CGA was provided and gait belt donned in order to ensure pt safety ?  ?Leg press  *10'@85'$ # ?2*10'@100'$ # ?*8'@115'$  Set up  therex Assistance for LE positioning throughout   ?SLS progression with ball hand off -  ?1 LE airex, 1 LE on step plus airex pad  X 10 with ea LE on airex  SGA NMR No Lob noted this session, good corrections when COM moves toward edge of BOS   ?Stair taps to 6 inch step shoulder horizontal ABD for larger amplitude movement  2 x 10 x ea LE CGA, No UE A this session  NMR Cues for large amplitude movement, " buzzer" when pt hit green TB for knowledge of performance throughout.  ?No "catching" of foot on step this session   ?      ?LAQ 2 x 10 x 7# on ea LE   There ex To improve knee extension strength  ?External cues for full knee extension ROM.   ?Ball roll outs  3 way 10 x 5 sec holds   Therex  To improve spinal mobility and discomfort in low back area   ?      ?      ?PRW! Step to airex pad ( external cue for step length)  2 x 10   CGA  Therex  Pad for increased balance challenge as well as for external cue for step length. Cues for proper UE movement.  ?- Missed pad and had trip on one attempt, able to correct with stepping pattern, CGA was provided for safety throughout  ?PWR! Up standing  2 x 10  CGA  There ex  Visual and verbal cues throughout, improved performance when mirroring PT  ?Good coordination and balance with task following intial cues for proper performance   ?      ?Treatment Provided this session  ? ?Pt educated throughout session about proper posture and technique with exercises. Improved exercise technique, movement at target joints, use of target muscles after min to mod verbal, visual, tactile cues. ? ?Note: Portions of this document were prepared using Dragon voice recognition software and although reviewed may contain unintentional dictation errors  in syntax, grammar, or spelling. ? ?Difficulty with STS transitions this session, pt provided with cues for proper performance with adequate forward weight shift, when performing with cues pt efficacy improved significantly  ? ? ? ? ? ? ? ? ? ? ? ? ? ? ? ? ? ? ? ? ? ? ? ? PT Education - 07/19/21 1437   ? ? Education Details Exercise form and technique   ? Person(s) Educated Patient   ? Methods Explanation   ? Comprehension Verbalized understanding   ? ?  ?  ? ?  ? ? ? PT Short Term Goals - 07/12/21 1107   ? ?  ? PT SHORT TERM GOAL #1  ? Title Pt to demonstrate improved sustained AMB tolerance >867f.   ? Baseline At evlauation: 6029ftolerance (not to failure); 9/26: [id number]   ? Time 4   ? Period Weeks   ? Status Achieved   ? Target Date 01/05/21   ?  ? PT SHORT TERM GOAL #2  ? Title Pt will improve BERG balance score to greater than 45 to indicate decrease risk of falls.   ? Baseline 2/13:44   ? Time 4   ? Period Weeks   ? Status Achieved   ? Target Date 06/28/21   ?  ? PT SHORT TERM GOAL #3  ? Title Pt to demonstrate 10MWT<13sec to show self-selected gait speed appropriate for AMB outside of house and to improve energy efficiency.   ? Baseline UpWalker Eval: 17.53sec; 9/26: 0.74 m/s (13.5)11/21: 13.68 (.73 m/s) 12.64 on 12/28 9.88 pn2/13   ? Time 4   ? Period Weeks   ? Status Achieved   ? Target Date 02/08/21   ? ?  ?  ? ?  ? ? ? ? PT Long Term  Goals - 07/12/21 1125   ? ?  ? PT LONG TERM GOAL #1  ? Title Pt will improve BERG balance score to greater than 47 to indicate decreaed risk of falls.   ? Baseline 41 on 12/28 44 on 2/13 45 on 3/27   ? Time 8   ?

## 2021-07-19 NOTE — Therapy (Signed)
[place] [facility] ?[place], [place], [address] ?Phone: [phone]   Fax:  [phone] ? ?Occupational Therapy Treatment ? ?Patient Details  ?Name: [name] ?MRN: [id number] ?Date of Birth: Jan 12, 1951 ?Referring Provider (OT): Dr. Sharolyn Douglas (neurologist at [facility]) ? ? ?Encounter Date: 07/19/2021 ? ? OT End of Session - 07/19/21 1709   ? ? Visit Number 17   ? Number of Visits 24   ? Date for OT Re-Evaluation 08/03/21   ? OT Start Time 1145   ? OT Stop Time 1230   ? OT Time Calculation (min) 45 min   ? Equipment Utilized During Treatment rollator   ? Activity Tolerance Patient tolerated treatment well   ? Behavior During Therapy [facility] for tasks assessed/performed   ? ?  ?  ? ?  ? ? ?Past Medical History:  ?Diagnosis Date  ? Arthritis   ? Bradycardia   ? Cancer [facility]) 2013  ? skin cancer  ? Depression   ? ptsd  ? Dysrhythmia   ? chronic slow heart rate  ? GERD (gastroesophageal reflux disease)   ? Headache(784.0)   ? tension headaches non recent  ? History of chicken pox   ? History of kidney stones   ? passed  ? Hypertension   ? treated with HCTZ  ? Pacemaker   ? Parkinson's disease ([place])   ? dx'ed 15 years ago  ? PTSD (post-traumatic stress disorder)   ? Shortness of breath dyspnea   ? Sleep apnea   ? doesn't use C-pap  ? Varicose veins   ? ? ?Past Surgical History:  ?Procedure Laterality Date  ? CHOLECYSTECTOMY N/A 10/22/2014  ? Procedure: LAPAROSCOPIC CHOLECYSTECTOMY WITH INTRAOPERATIVE CHOLANGIOGRAM;  Surgeon: Dia Crawford III, MD;  Location: [facility] ORS;  Service: General;  Laterality: N/A;  ? cyst removed     ? from lip as a child  ? INTRAMEDULLARY (IM) NAIL INTERTROCHANTERIC N/A 02/18/2019  ? Procedure: INTRAMEDULLARY (IM) NAIL INTERTROCHANTRIC, RIGHT,;  Surgeon: Thornton Park, MD;  Location: [facility] ORS;  Service: Orthopedics;  Laterality: N/A;  ? LUMBAR LAMINECTOMY/DECOMPRESSION MICRODISCECTOMY Bilateral 12/14/2012  ? Procedure: Bilateral lumbar  three-four, four-five decompressive laminotomy/foraminotomy;  Surgeon: Charlie Pitter, MD;  Location: [place] NEURO ORS;  Service: Neurosurgery;  Laterality: Bilateral;  ? PULSE GENERATOR IMPLANT Bilateral 12/13/2013  ? Procedure: Bilateral implantable pulse generator placement;  Surgeon: Erline Levine, MD;  Location: [place] NEURO ORS;  Service: Neurosurgery;  Laterality: Bilateral;  Bilateral implantable pulse generator placement  ? skin cancer removed    ? from ears,   12 lft arm  rt leg 15  ? SUBTHALAMIC STIMULATOR BATTERY REPLACEMENT Bilateral 07/14/2017  ? Procedure: BILATERAL IMPLANTED PULSE GENERATOR CHANGE FOR DEEP BRAIN STIMULATOR;  Surgeon: Erline Levine, MD;  Location: [place];  Service: Neurosurgery;  Laterality: Bilateral;  ? SUBTHALAMIC STIMULATOR INSERTION Bilateral 12/06/2013  ? Procedure: SUBTHALAMIC STIMULATOR INSERTION;  Surgeon: Erline Levine, MD;  Location: [place] NEURO ORS;  Service: Neurosurgery;  Laterality: Bilateral;  Bilateral deep brain stimulator placement  ? ? ?There were no vitals filed for this visit. ? ? Subjective Assessment - 07/19/21 1706   ? ? Subjective  "We forgot his son's birthday yesterday." (per spouse)   ? Patient is accompanied by: Family member   ? Pertinent History Parkinson's Disease Diagnosed in 2000, frequent falls   ? Limitations UE/LE tremors, decreased balance, lack of coordination, intermittent diplopia (has prism glasses)   ? Patient Stated Goals Pt/spouse verbalize desire to  improve [facility] skills for holding playing cards, and improving shoulder flexibility and posture for better walker use.   ? Currently in Pain? Yes   ? Pain Score 4    ? Pain Location Back   ? Pain Orientation Lower   ? Pain Descriptors / Indicators Aching   ? Pain Type Chronic pain   ? Pain Radiating Towards low back/L hip   ? Pain Onset More than a month ago   ? Pain Frequency Intermittent   ? Aggravating Factors  early morning, later afternoon, walking, transfers   ? Pain Relieving Factors rest, heat   ? Effect of  Pain on Daily Activities difficulty with ADLs and walking   ? Multiple Pain Sites No   ? Pain Onset More than a month ago   ? ?  ?  ? ?  ? ?Occupational Therapy Treatment: ?Therapeutic Activity: ?Pt reported forgetting about his son's birthday this weekend, and has been wanting to utilize his phone calendar more to be alerted to events and important days without having to repeatedly ask spouse.  Assisted pt to enter important dates on phone calendar, including annual events such as birthdays and anniversaries, and 1 time events.  Assisted pt to set alerts on phone to notify of his upcoming events the day before and morning of d/t STM deficits.  Pt utilized his stylus and entered data above to promote [facility] skills, OT provided sequencing cues to navigate calendar entries.  Pt will continue to benefit from skilled OT for increasing bilat shoulder flexibility for UB ADLs, instructing in [facility] exercises for better manipulation of ADL supplies, and continuing to reinforce ADL strategies to maximize safety and indep with daily tasks. ? ? ? OT Education - 07/19/21 1708   ? ? Education Details use of calendar on phone   ? Person(s) Educated Patient   ? Methods Explanation;Verbal cues;Demonstration   ? Comprehension Verbalized understanding;Returned demonstration;Verbal cues required;Need further instruction   ? ?  ?  ? ?  ? ? ? OT Short Term Goals - 06/21/21 1143   ? ?  ? OT SHORT TERM GOAL #1  ? Title Pt/caregiver will be indep with HEP   ? Baseline Eval: not yet initiated; Pt has been consistent to send text messages as part of his home activity program/ issued theraputty today with instruction in coordination exercises, further training needed   ? Time 6   ? Period Weeks   ? Status Partially Met   ? Target Date 06/22/21   ?  ? OT SHORT TERM GOAL #2  ? Title --   ? Baseline Eval: R 41 sec, L 41 sec; 06/21/21: R 41 sec, L 34 sec   ? Time --   ? Status On-going   ? Target Date --   ? ?  ?  ? ?  ? ? ? ? OT Long Term Goals -  06/21/21 1150   ? ?  ? OT LONG TERM GOAL #1  ? Title Pt will improve FOTO score to 40 or better to indicate increased functional improvement.   ? Baseline Eval: FOTO 34; 06/21/21: FOTO 40   ? Time 12   ? Period Weeks   ? Status On-going   ? Target Date 08/03/21   ?  ? OT LONG TERM GOAL #2  ? Title Pt will improve R lateral pinch strength to be able to tolerate holding playing cards in R dominant hand for game of cards.   ? Baseline Eval: R  lateral pinch 16 lbs; pt enjoys playing cards but is not currently playing d/t decreased strength/coordination; 06/21/21: R 25, L 19 (pt also purchased card holder to compensate for "off" days or times)   ? Time 12   ? Period Weeks   ? Status On-going   ? Target Date 08/03/21   ?  ? OT LONG TERM GOAL #3  ? Title Pt will improve R dominant hand to mouth coordination to self feed with minimal spilling and utilize adapted utensils as needed.   ? Baseline Eval: Spouse reports eating is messy and pt struggles to keep utensil level; 06/21/21: Pt uses long handled teaspoon; OT encouraged applying foam to build up handle as pt reports it's becoming more difficult to grip.  Pt reports stuggle with hand to mouth coordination and continues to spill.   ? Time 12   ? Period Weeks   ? Status On-going   ? Target Date 08/03/21   ?  ? OT LONG TERM GOAL #4  ? Title Pt will improve bilat shoulder flex/abd for improved overhead reach during self care.   ? Baseline Eval: R shoulder flex 110, L 120, R abd 125, L 165; R shoulder flex; 06/21/21: R flex 115, L 143; R abd 144, L 170   ? Time 12   ? Period Weeks   ? Status On-going   ? Target Date 08/03/21   ?  ? OT LONG TERM GOAL #5  ? Title Pt will improve [place] in bilat hands as noted by improvement in 9 hole peg test score by 5 or more seconds to enable participate in family board games.   ? Baseline Eval: R/L 9 hole peg test 41 secs (pt has not recently participated in board games); 06/21/21: R 41 sec, L 34 sec   ? Time 12   ? Period Weeks   ? Status Partially Met    ? Target Date 08/03/21   ?  ? OT LONG TERM GOAL #6  ? Title Pt will be able to initiate simple texts and reply to texts on phone with min vc.   ? Baseline Eval: Pt struggles with texting; 06/21/21: mod vc to init

## 2021-07-19 NOTE — Chronic Care Management (AMB) (Signed)
? ? ?  Chronic Care Management ?Pharmacy Assistant  ? ?Name: [name]  MRN: [id number] DOB: 1951-01-11 ? ?[name] is an 71 y.o. year old male who presents for his initial CCM visit with the clinical pharmacist. ? ?Reason for Encounter: Initial Questions ?  ?Conditions to be addressed/monitored: ?HTN and HLD ? ?Recent office visits:  ?None in 6 months  ? ?Recent consult visits:  ?07/14/21-[facility] OP rehab services- multiple visits over the past  6 months  ?07/13/21-Behavioral Health -LCSW- no data found ?07/07/21- [place] - telephone encounter. ?06/28/21-Cardiology- Lysbeth Galas Lambert,MD-periop Device check. ?06/22/21-Neurology-Sneha Mantri,MD- Brain stimulator clearance  ?06/04/21-[facility]-Follow up Parkinsons disease.No medication changes  ?05/20/21-Dermatology-David Kowalski,MD- New evaluation for skin lesions. Cryotherapy -gentle skin care guidance. No medication changes  ?05/07/21-Neurology-Sneha Matri,MD- DBS progamming ?02/08/21-[facility]- Neurology follow up-Today we are planning to increase Sinemet 9 AM dose by 0.5 tabs for a total of 2.5 tabs to see if there is any symptomatic improvement. Visual hallucinations prior to sleep could possibly be secondary to hypnogogic jerk. Will readdress if this is an ongoing issue.  ? ? ?Hospital visits:  ?None in previous 6 months ? ?Medications: ?Outpatient Encounter Medications as of 07/19/2021  ?Medication Sig  ? aspirin EC 81 MG tablet Take 1 tablet (81 mg total) by mouth daily. Swallow whole.  ? carbidopa-levodopa (SINEMET IR) 25-100 MG tablet 2 tablets in AM, 1.5 tablets in afternoon, and 1.5 tablets in the evening  ? cholecalciferol (VITAMIN D) 1000 UNITS tablet Take 1,000 Units by mouth daily.  ? Cranberry 200 MG CAPS Take by mouth.  ? escitalopram (LEXAPRO) 10 MG tablet TAKE 1 TABLET DAILY  ? Melatonin 10 MG TABS Take 10 mg by mouth at bedtime.  ? memantine (NAMENDA) 10 MG tablet TAKE 1 TABLET TWICE A DAY  ? midodrine  (PROAMATINE) 5 MG tablet Take 1 tablet (5 mg total) by mouth as needed. For orthostasis, drops in pressure <100 when standing  ? naproxen sodium (ALEVE) 220 MG tablet Take 220 mg by mouth. (Patient not taking: Reported on 05/12/2021)  ? omeprazole (PRILOSEC) 20 MG capsule TAKE 1 CAPSULE DAILY  ? rivastigmine (EXELON) 9.5 mg/24hr 9.5 mg daily.  ? UNABLE TO FIND Med Name: Trimix (30/1/10)-(Pap/Phent/PGE) ? ?Test Dose  81m vial  ? ?Qty #3 Refills 0 ? ?[facility] ?[phone]?Fax [phone](Patient not taking: Reported on 05/12/2021)  ? ?No facility-administered encounter medications on file as of 07/19/2021.  ? ? ?No results found for: HGBA1C, MICROALBUR  ? ?BP Readings from Last 3 Encounters:  ?12/29/20 122/80  ?12/10/20 (!) 160/78  ?12/09/20 120/78  ? ? ? ? ?Patient contacted to confirm telephone appointment with LCharlene Brooke Pharm D, on 07/22/21 at 9:30am. ? ?Do you have any problems getting your medications? No ? ?What is your top health concern you would like to discuss at your upcoming visit? No concern  ? ?Have you seen any other providers since your last visit with PCP? Yes  Multiple PT,OT therapy visits, LCSW visits  ? ? ? ?Star Rating Drugs:  ?Medication:  Last Fill: Day Supply ?No star medications identified  ? ?Care Gaps: ?Annual wellness visit in last year? Yes ?Most Recent BP reading:111/74  67-P  06/04/21   ? ? ?LCharlene Brooke CPP notified ? ?Sopheap Boehle, CCMA ?Health concierge  ?[phone] ?

## 2021-07-21 ENCOUNTER — Encounter: Payer: Self-pay | Admitting: Physical Therapy

## 2021-07-21 ENCOUNTER — Ambulatory Visit: Payer: Medicare PPO | Admitting: Occupational Therapy

## 2021-07-21 ENCOUNTER — Encounter: Payer: Self-pay | Admitting: Occupational Therapy

## 2021-07-21 ENCOUNTER — Ambulatory Visit: Payer: Medicare PPO | Admitting: Physical Therapy

## 2021-07-21 DIAGNOSIS — G20 Parkinson's disease: Secondary | ICD-10-CM | POA: Diagnosis not present

## 2021-07-21 DIAGNOSIS — R269 Unspecified abnormalities of gait and mobility: Secondary | ICD-10-CM

## 2021-07-21 DIAGNOSIS — R278 Other lack of coordination: Secondary | ICD-10-CM | POA: Diagnosis not present

## 2021-07-21 DIAGNOSIS — R2681 Unsteadiness on feet: Secondary | ICD-10-CM

## 2021-07-21 DIAGNOSIS — R296 Repeated falls: Secondary | ICD-10-CM | POA: Diagnosis not present

## 2021-07-21 DIAGNOSIS — R262 Difficulty in walking, not elsewhere classified: Secondary | ICD-10-CM | POA: Diagnosis not present

## 2021-07-21 DIAGNOSIS — M6281 Muscle weakness (generalized): Secondary | ICD-10-CM

## 2021-07-21 DIAGNOSIS — M25512 Pain in left shoulder: Secondary | ICD-10-CM | POA: Diagnosis not present

## 2021-07-21 DIAGNOSIS — M4726 Other spondylosis with radiculopathy, lumbar region: Secondary | ICD-10-CM | POA: Diagnosis not present

## 2021-07-21 NOTE — Therapy (Signed)
[place] [facility] ?[place], [place], [address] ?Phone: [phone]   Fax:  [phone] ? ?Physical Therapy Treatment ? ?Patient Details  ?Name: [name] ?MRN: [id number] ?Date of Birth: 07-11-1950 ?Referring Provider (PT): Elsie Stain, MD ? ? ?Encounter Date: 07/21/2021 ? ? PT End of Session - 07/21/21 1107   ? ? Visit Number 64   ? Number of Visits 62   ? Date for PT Re-Evaluation 08/23/21   ? Authorization Type [employer] Medicare Choice PPO   ? Progress Note Due on Visit 60   ? PT Start Time 1101   ? PT Stop Time 1145   ? PT Time Calculation (min) 44 min   ? Equipment Utilized During Treatment Gait belt   ? Activity Tolerance Patient tolerated treatment well   ? Behavior During Therapy [facility] for tasks assessed/performed   ? ?  ?  ? ?  ? ? ?Past Medical History:  ?Diagnosis Date  ? Arthritis   ? Bradycardia   ? Cancer [facility]) 2013  ? skin cancer  ? Depression   ? ptsd  ? Dysrhythmia   ? chronic slow heart rate  ? GERD (gastroesophageal reflux disease)   ? Headache(784.0)   ? tension headaches non recent  ? History of chicken pox   ? History of kidney stones   ? passed  ? Hypertension   ? treated with HCTZ  ? Pacemaker   ? Parkinson's disease ([place])   ? dx'ed 15 years ago  ? PTSD (post-traumatic stress disorder)   ? Shortness of breath dyspnea   ? Sleep apnea   ? doesn't use C-pap  ? Varicose veins   ? ? ?Past Surgical History:  ?Procedure Laterality Date  ? CHOLECYSTECTOMY N/A 10/22/2014  ? Procedure: LAPAROSCOPIC CHOLECYSTECTOMY WITH INTRAOPERATIVE CHOLANGIOGRAM;  Surgeon: Dia Crawford III, MD;  Location: [facility] ORS;  Service: General;  Laterality: N/A;  ? cyst removed     ? from lip as a child  ? INTRAMEDULLARY (IM) NAIL INTERTROCHANTERIC N/A 02/18/2019  ? Procedure: INTRAMEDULLARY (IM) NAIL INTERTROCHANTRIC, RIGHT,;  Surgeon: Thornton Park, MD;  Location: [facility] ORS;  Service: Orthopedics;  Laterality: N/A;  ? LUMBAR LAMINECTOMY/DECOMPRESSION MICRODISCECTOMY  Bilateral 12/14/2012  ? Procedure: Bilateral lumbar three-four, four-five decompressive laminotomy/foraminotomy;  Surgeon: Charlie Pitter, MD;  Location: [place] NEURO ORS;  Service: Neurosurgery;  Laterality: Bilateral;  ? PULSE GENERATOR IMPLANT Bilateral 12/13/2013  ? Procedure: Bilateral implantable pulse generator placement;  Surgeon: Erline Levine, MD;  Location: [place] NEURO ORS;  Service: Neurosurgery;  Laterality: Bilateral;  Bilateral implantable pulse generator placement  ? skin cancer removed    ? from ears,   12 lft arm  rt leg 15  ? SUBTHALAMIC STIMULATOR BATTERY REPLACEMENT Bilateral 07/14/2017  ? Procedure: BILATERAL IMPLANTED PULSE GENERATOR CHANGE FOR DEEP BRAIN STIMULATOR;  Surgeon: Erline Levine, MD;  Location: [place];  Service: Neurosurgery;  Laterality: Bilateral;  ? SUBTHALAMIC STIMULATOR INSERTION Bilateral 12/06/2013  ? Procedure: SUBTHALAMIC STIMULATOR INSERTION;  Surgeon: Erline Levine, MD;  Location: [place] NEURO ORS;  Service: Neurosurgery;  Laterality: Bilateral;  Bilateral deep brain stimulator placement  ? ? ?There were no vitals filed for this visit. ? ? Subjective Assessment - 07/21/21 1106   ? ? Subjective Pt reports 1 fall this morning. Pt reports he cannot recall the details of his fall.  It occurred when he was trying to get dressed.  Patient caregiver reports patient is very lethargic this morning and had difficulty getting  out of bed.   ? Patient is accompained by: Family member   ? Pertinent History [name] is a 51yoM who comes to [facility] neuro for evaluation of conitnued difficulty of imbalance, unsteadiness on feet, and generally limited tolerance to mobility in the setting of chronic parkinsonism. SInce DC in February, pt has had PPM placement due to bradycardia issues. Pt also started using an UpWalker device 2 week prior to this evaluation, goal was to help with gradual melting of postural extension that occured with sustained upright actiivty. Pt has continued to practice supervised  AMB with caregivers, short distances out of house and in community. Pt continues to experience frequent falls on a weekly basis, at a rate that both he and wife report to be increasing in frequency. PMH: HTN, bradycardia s/p PPM, PD, DBS, COVID-19 infection, chronic low back pain s/p L4/5 surgery.   ? Limitations Walking;Standing;Sitting;House hold activities;Lifting   ? How long can you sit comfortably? Not limited   ? How long can you stand comfortably? 5 minutes with support   ? How long can you walk comfortably? ~5-6 minutes [name]   ? Patient Stated Goals Reduce falls, improve AMB tolerance   ? Pain Onset More than a month ago   ? ?  ?  ? ?  ? ? ? ?Exercise/Activity Sets/Reps/Time/ Resistance Assistance Charge type Comments- Unless otherwise stated, CGA was provided and gait belt donned in order to ensure pt safety ?  ?Leg press  *10'@85'$ # ?2*10'@100'$ # ?*10'@115'$  Set up  therex Assistance for LE positioning throughout and cues for maintenance of positioing   ?      ?Stair taps to 6 inch step shoulder horizontal ABD for larger amplitude movement  2 x 10 x ea LE CGA, No UE A this session  NMR Cues for large amplitude movement, " buzzer" when pt hit green TB for knowledge of performance throughout.  ?No "catching" of foot on step this session   ?1 LE on airex, 1 LE on 6 inch step plus airex  2 x 45 sec ea  No UE, CGA for safety  NMR  Cues for staiding tall and keeping eyes in front for postural activation .   ?LAQ 2 x 10 x 7.5# on ea LE   There ex To improve knee extension strength  ?External cues for full knee extension ROM.   ?      ?      ?      ?PRW! Step to airex pad and over hurdle  (external cue for step length)  2 x 10   CGA  Therex  Pad for increased balance challenge as well as for external cue for step length. Cues for proper UE movement.  ?-  hit hurdle on a few attempts with each LE. No LOB noted  ?Cues of rnot utilizing UE for support   ?PWR! Up standing  2 x 10  CGA Therex  Visual and verbal cues  throughout, improved performance when mirroring PT  ?Good coordination and balance with task following intial cues for proper performance   ?      ?Treatment Provided this session  ? ?Pt educated throughout session about proper posture and technique with exercises. Improved exercise technique, movement at target joints, use of target muscles after min to mod verbal, visual, tactile cues. ? ?Note: Portions of this document were prepared using Dragon voice recognition software and although reviewed may contain unintentional dictation errors in syntax, grammar, or spelling. ? ?  Difficulty with STS transitions this session, pt provided with cues for proper performance with adequate forward weight shift, when performing with cues pt efficacy improved significantly  ? ? ? ? ? ? ? ? ? ? ? ? ? ? ? ? ? ? ? ? ? ? ? ? ? ? PT Education - 07/21/21 1510   ? ? Education provided Yes   ? Education Details Exercise technique   ? Person(s) Educated Patient   ? Methods Explanation   ? Comprehension Verbalized understanding   ? ?  ?  ? ?  ? ? ? PT Short Term Goals - 07/12/21 1107   ? ?  ? PT SHORT TERM GOAL #1  ? Title Pt to demonstrate improved sustained AMB tolerance >850f.   ? Baseline At evlauation: 6057ftolerance (not to failure); 9/26: [id number]   ? Time 4   ? Period Weeks   ? Status Achieved   ? Target Date 01/05/21   ?  ? PT SHORT TERM GOAL #2  ? Title Pt will improve BERG balance score to greater than 45 to indicate decrease risk of falls.   ? Baseline 2/13:44   ? Time 4   ? Period Weeks   ? Status Achieved   ? Target Date 06/28/21   ?  ? PT SHORT TERM GOAL #3  ? Title Pt to demonstrate 10MWT<13sec to show self-selected gait speed appropriate for AMB outside of house and to improve energy efficiency.   ? Baseline UpWalker Eval: 17.53sec; 9/26: 0.74 m/s (13.5)11/21: 13.68 (.73 m/s) 12.64 on 12/28 9.88 pn2/13   ? Time 4   ? Period Weeks   ? Status Achieved   ? Target Date 02/08/21   ? ?  ?  ? ?  ? ? ? ? PT Long Term Goals -  07/12/21 1125   ? ?  ? PT LONG TERM GOAL #1  ? Title Pt will improve BERG balance score to greater than 47 to indicate decreaed risk of falls.   ? Baseline 41 on 12/28 44 on 2/13 45 on 3/27   ? Time 8   ?

## 2021-07-21 NOTE — Therapy (Signed)
[facility] ?[place], [place], [address] ?Phone: [phone]   Fax:  [phone] ? ?Occupational Therapy Treatment ? ?Patient Details  ?Name: [name] ?MRN: [id number] ?Date of Birth: Mar 15, 1951 ?Referring Provider (OT): Dr. Sharolyn Douglas (neurologist at [facility]) ? ? ?Encounter Date: 07/21/2021 ? ? OT End of Session - 07/21/21 1401   ? ? Visit Number 18   ? Number of Visits 24   ? Date for OT Re-Evaluation 08/03/21   ? OT Start Time 1018   ? OT Stop Time 1100   ? OT Time Calculation (min) 42 min   ? Equipment Utilized During Treatment rollator   ? Activity Tolerance Patient tolerated treatment well   ? Behavior During Therapy [facility] for tasks assessed/performed   ? ?  ?  ? ?  ? ? ?Past Medical History:  ?Diagnosis Date  ? Arthritis   ? Bradycardia   ? Cancer [facility]) 2013  ? skin cancer  ? Depression   ? ptsd  ? Dysrhythmia   ? chronic slow heart rate  ? GERD (gastroesophageal reflux disease)   ? Headache(784.0)   ? tension headaches non recent  ? History of chicken pox   ? History of kidney stones   ? passed  ? Hypertension   ? treated with HCTZ  ? Pacemaker   ? Parkinson's disease ([place])   ? dx'ed 15 years ago  ? PTSD (post-traumatic stress disorder)   ? Shortness of breath dyspnea   ? Sleep apnea   ? doesn't use C-pap  ? Varicose veins   ? ? ?Past Surgical History:  ?Procedure Laterality Date  ? CHOLECYSTECTOMY N/A 10/22/2014  ? Procedure: LAPAROSCOPIC CHOLECYSTECTOMY WITH INTRAOPERATIVE CHOLANGIOGRAM;  Surgeon: Dia Crawford III, MD;  Location: [facility] ORS;  Service: General;  Laterality: N/A;  ? cyst removed     ? from lip as a child  ? INTRAMEDULLARY (IM) NAIL INTERTROCHANTERIC N/A 02/18/2019  ? Procedure: INTRAMEDULLARY (IM) NAIL INTERTROCHANTRIC, RIGHT,;  Surgeon: Thornton Park, MD;  Location: [facility] ORS;  Service: Orthopedics;  Laterality: N/A;  ? LUMBAR LAMINECTOMY/DECOMPRESSION MICRODISCECTOMY Bilateral 12/14/2012  ? Procedure: Bilateral lumbar  three-four, four-five decompressive laminotomy/foraminotomy;  Surgeon: Charlie Pitter, MD;  Location: [place] [facility] ORS;  Service: Neurosurgery;  Laterality: Bilateral;  ? PULSE GENERATOR IMPLANT Bilateral 12/13/2013  ? Procedure: Bilateral implantable pulse generator placement;  Surgeon: Erline Levine, MD;  Location: [place] NEURO ORS;  Service: Neurosurgery;  Laterality: Bilateral;  Bilateral implantable pulse generator placement  ? skin cancer removed    ? from ears,   12 lft arm  rt leg 15  ? SUBTHALAMIC STIMULATOR BATTERY REPLACEMENT Bilateral 07/14/2017  ? Procedure: BILATERAL IMPLANTED PULSE GENERATOR CHANGE FOR DEEP BRAIN STIMULATOR;  Surgeon: Erline Levine, MD;  Location: [place];  Service: Neurosurgery;  Laterality: Bilateral;  ? SUBTHALAMIC STIMULATOR INSERTION Bilateral 12/06/2013  ? Procedure: SUBTHALAMIC STIMULATOR INSERTION;  Surgeon: Erline Levine, MD;  Location: [place] NEURO ORS;  Service: Neurosurgery;  Laterality: Bilateral;  Bilateral deep brain stimulator placement  ? ? ?There were no vitals filed for this visit. ? ? Subjective Assessment - 07/21/21 1400   ? ? Subjective  Pt. arrived with a low battery power on his cell phone.   ? Patient is accompanied by: Family member   ? Pertinent History Parkinson's Disease Diagnosed in 2000, frequent falls   ? Currently in Pain? Yes   ? Pain Score 5    ? Pain Location Back   ?  Pain Orientation Lower   ? Pain Descriptors / Indicators Aching   ? Pain Type Chronic pain   ? Pain Onset More than a month ago   ? ?  ?  ? ?  ? ?Therapeutic Activities: ? ?Pt. worked on sit to stand, standing unsupported, and functional reaching across midline with sit to stand reps in between. Pt. worked on grasping large flat shapes, crossing midline to move them through varying heights using the vertical dowels. Pt. worked on reaching for [employer], reaching across midline to move them through 4 levels of horizontal rungs placed at progressively higher heights. Pt. was further challenged by  performing sit to stand without armrest support. Pt. worked on these reaching tasks in preparation for reaching during ADLs, and IADL tasks.  ? ?Pt. reports having had a fall this morning when attempting to get up from his bed. Pt. Reports that his body would not do what he was wanting it to do. Pt. reports that he forgot to charge his cell phone this morning, and did not bring in his charger today. Pt. required frequent consistent cueing for hand placements, movement patterns, and step by step cues for directions. Pt. Continues to work on improving core strength, and functional reaching tasks needed for improving engagement, and safety with ADL, and IADL tasks. Plan to revisit Calendar/schedule management tasks next session. ? ? ? ? ? ? ? ? ? ? ? ? ? ? ? ? ? ? ? ? ? ? ? ? OT Education - 07/21/21 1401   ? ? Education Details sit to stand, dynamic standing balance during ADLs.   ? Person(s) Educated Patient   ? Methods Explanation;Verbal cues;Demonstration   ? Comprehension Verbalized understanding;Returned demonstration;Verbal cues required;Need further instruction   ? ?  ?  ? ?  ? ? ? OT Short Term Goals - 06/21/21 1143   ? ?  ? OT SHORT TERM GOAL #1  ? Title Pt/caregiver will be indep with HEP   ? Baseline Eval: not yet initiated; Pt has been consistent to send text messages as part of his home activity program/ issued theraputty today with instruction in coordination exercises, further training needed   ? Time 6   ? Period Weeks   ? Status Partially Met   ? Target Date 06/22/21   ?  ? OT SHORT TERM GOAL #2  ? Title --   ? Baseline Eval: R 41 sec, L 41 sec; 06/21/21: R 41 sec, L 34 sec   ? Time --   ? Status On-going   ? Target Date --   ? ?  ?  ? ?  ? ? ? ? OT Long Term Goals - 06/21/21 1150   ? ?  ? OT LONG TERM GOAL #1  ? Title Pt will improve FOTO score to 40 or better to indicate increased functional improvement.   ? Baseline Eval: FOTO 34; 06/21/21: FOTO 40   ? Time 12   ? Period Weeks   ? Status On-going   ?  Target Date 08/03/21   ?  ? OT LONG TERM GOAL #2  ? Title Pt will improve R lateral pinch strength to be able to tolerate holding playing cards in R dominant hand for game of cards.   ? Baseline Eval: R lateral pinch 16 lbs; pt enjoys playing cards but is not currently playing d/t decreased strength/coordination; 06/21/21: R 25, L 19 (pt also purchased card holder to compensate for "off" days or times)   ?  Time 12   ? Period Weeks   ? Status On-going   ? Target Date 08/03/21   ?  ? OT LONG TERM GOAL #3  ? Title Pt will improve R dominant hand to mouth coordination to self feed with minimal spilling and utilize adapted utensils as needed.   ? Baseline Eval: Spouse reports eating is messy and pt struggles to keep utensil level; 06/21/21: Pt uses long handled teaspoon; OT encouraged applying foam to build up handle as pt reports it's becoming more difficult to grip.  Pt reports stuggle with hand to mouth coordination and continues to spill.   ? Time 12   ? Period Weeks   ? Status On-going   ? Target Date 08/03/21   ?  ? OT LONG TERM GOAL #4  ? Title Pt will improve bilat shoulder flex/abd for improved overhead reach during self care.   ? Baseline Eval: R shoulder flex 110, L 120, R abd 125, L 165; R shoulder flex; 06/21/21: R flex 115, L 143; R abd 144, L 170   ? Time 12   ? Period Weeks   ? Status On-going   ? Target Date 08/03/21   ?  ? OT LONG TERM GOAL #5  ? Title Pt will improve Brush in bilat hands as noted by improvement in 9 hole peg test score by 5 or more seconds to enable participate in family board games.   ? Baseline Eval: R/L 9 hole peg test 41 secs (pt has not recently participated in board games); 06/21/21: R 41 sec, L 34 sec   ? Time 12   ? Period Weeks   ? Status Partially Met   ? Target Date 08/03/21   ?  ? OT LONG TERM GOAL #6  ? Title Pt will be able to initiate simple texts and reply to texts on phone with min vc.   ? Baseline Eval: Pt struggles with texting; 06/21/21: mod vc to initiate a simple text,  min-mod vc to respond   ? Time 12   ? Period Weeks   ? Status On-going   ? Target Date 08/03/21   ?  ? OT LONG TERM GOAL #7  ? Title Pt will improve posture for ADLs and functional mobility as demonstrated by ability to keep

## 2021-07-22 ENCOUNTER — Ambulatory Visit (INDEPENDENT_AMBULATORY_CARE_PROVIDER_SITE_OTHER): Payer: Medicare PPO | Admitting: Pharmacist

## 2021-07-22 DIAGNOSIS — F320 Major depressive disorder, single episode, mild: Secondary | ICD-10-CM

## 2021-07-22 DIAGNOSIS — E7800 Pure hypercholesterolemia, unspecified: Secondary | ICD-10-CM

## 2021-07-22 DIAGNOSIS — F0280 Dementia in other diseases classified elsewhere without behavioral disturbance: Secondary | ICD-10-CM

## 2021-07-22 DIAGNOSIS — G20 Parkinson's disease: Secondary | ICD-10-CM

## 2021-07-22 DIAGNOSIS — K219 Gastro-esophageal reflux disease without esophagitis: Secondary | ICD-10-CM

## 2021-07-22 NOTE — Progress Notes (Signed)
? ?Chronic Care Management ?Pharmacy Note ? ?07/27/2021 ?Name:  [name] MRN:  [id number] DOB:  1950/06/27 ? ?Summary: CCM Initial visit ?-[name] [name] is main caregiver, she sets up pill box and endorses compliance ?-Pt follows with [facility] for Parkinsons and recently switched to [place] ? ?Recommendations/Changes made from today's visit: ?-No med changes (updated med list with [facility]) ? ?Plan: ?-[facility] will call patient 6 months for general update ?-Pharmacist follow up televisit scheduled for 1 year ?-PCP 08/12/21 ? ? ?Subjective: ?[name] is an 71 y.o. year old male who is a primary patient of Damita Dunnings, Elveria Rising, MD.  The CCM team was consulted for assistance with disease management and care coordination needs.   ? ?Engaged with patient by telephone for initial visit in response to provider referral for pharmacy case management and/or care coordination services.  ? ?Consent to Services:  ?The patient was given the following information about Chronic Care Management services today, agreed to services, and gave verbal consent: 1. CCM service includes personalized support from designated clinical staff supervised by the primary care provider, including individualized plan of care and coordination with other care providers 2. 24/7 contact phone numbers for assistance for urgent and routine care needs. 3. Service will only be billed when office clinical staff spend 20 minutes or more in a month to coordinate care. 4. Only one practitioner may furnish and bill the service in a calendar month. 5.The patient may stop CCM services at any time (effective at the end of the month) by phone call to the office staff. 6. The patient will be responsible for cost sharing (co-pay) of up to 20% of the service fee (after annual deductible is met). Patient agreed to services and consent obtained. ? ?Patient Care Team: ?Tonia Ghent, MD as PCP - General (Family Medicine) ?Minna Merritts, MD as  PCP - Cardiology (Cardiology) ?Vickie Epley, MD as PCP - Electrophysiology (Cardiology) ?Bauert, Nicolasa Ducking, LCSW as Education officer, museum (Licensed Holiday representative) ?Laverle Hobby, MD as Consulting Physician (Pulmonary Disease) ?Minna Merritts, MD as Consulting Physician (Cardiology) ?Earnie Larsson, MD as Consulting Physician (Neurosurgery) ?Ree Edman, MD as Consulting Physician (Dermatology) ?Gianny Sabino, [employer] as Pharmacist (Pharmacist) ? ?Recent office visits: ?12/09/20 Dr Einar Pheasant OV: c/o ear pain. Sinus drainage. Try OTC allergy relief. ? ?11/17/20 Dr Damita Dunnings OV: AWV - no med changes. ? ?Recent consult visits: ?07/14/21-[facility] OP rehab services- multiple visits over the past  6 months  ?07/13/21-Behavioral Health -LCSW- no data found ?07/07/21- [facility] - telephone encounter. ?06/28/21-Cardiology- Lysbeth Galas Lambert,MD-periop Device check. ?06/22/21-Neurology-Sneha Mantri,MD- Brain stimulator clearance  ?06/04/21-[facility]-Follow up Parkinsons disease.No medication changes  ?05/20/21-Dermatology-David Kowalski,MD- New evaluation for skin lesions. Cryotherapy -gentle skin care guidance. No medication changes  ?05/07/21-Neurology-Sneha Matri,MD- DBS progamming ?02/08/21-[facility]- Neurology follow up-Today we are planning to increase Sinemet 9 AM dose by 0.5 tabs for a total of 2.5 tabs to see if there is any symptomatic improvement. Visual hallucinations prior to sleep could possibly be secondary to hypnogogic jerk. Will readdress if this is an ongoing issue.  ? ?Hospital visits: ?None in previous 6 months ? ? ?Objective: ? ?Lab Results  ?Component Value Date  ? CREATININE 0.97 11/06/2020  ? BUN 22 11/06/2020  ? GFR 78.59 11/06/2020  ? GFRNONAA >60 10/02/2019  ? GFRAA >60 10/02/2019  ? NA 142 11/06/2020  ? K 4.2 11/06/2020  ? CALCIUM 9.5 11/06/2020  ? CO2 31 11/06/2020  ? GLUCOSE  112 (H) 11/06/2020  ? ? ?Lab Results  ?Component Value Date/Time  ? GFR 78.59  11/06/2020 08:06 AM  ? GFR 69.20 10/04/2018 10:39 AM  ?  ?Last diabetic Eye exam: No results found for: HMDIABEYEEXA  ?Last diabetic Foot exam: No results found for: HMDIABFOOTEX  ? ?Lab Results  ?Component Value Date  ? CHOL 182 11/06/2020  ? HDL 37.10 (L) 11/06/2020  ? LDLCALC 109 (H) 11/06/2020  ? TRIG 182.0 (H) 11/06/2020  ? CHOLHDL 5 11/06/2020  ? ? ? ?  Latest Ref Rng & Units 11/06/2020  ?  8:06 AM 10/04/2018  ? 10:39 AM 09/28/2017  ?  9:34 AM  ?Hepatic Function  ?Total Protein 6.0 - 8.3 g/dL 6.9   6.5   7.2    ?Albumin 3.5 - 5.2 g/dL 4.2   4.3   4.3    ?AST 0 - 37 U/L $Remo'12   11   11    'LopAa$ ?ALT 0 - 53 U/L $Remo'9   6   3    'fxZDi$ ?Alk Phosphatase 39 - 117 U/L 96   80   87    ?Total Bilirubin 0.2 - 1.2 mg/dL 0.5   0.7   0.6    ?Bilirubin, Direct 0.0 - 0.3 mg/dL   0.1    ? ? ?Lab Results  ?Component Value Date/Time  ? TSH 2.08 11/06/2020 08:06 AM  ? TSH 0.56 07/21/2015 03:32 PM  ? ? ? ?  Latest Ref Rng & Units 11/06/2020  ?  8:06 AM 10/02/2019  ?  2:15 PM 02/24/2019  ?  5:40 AM  ?CBC  ?WBC 4.0 - 10.5 K/uL 6.2   7.3   5.6    ?Hemoglobin 13.0 - 17.0 g/dL 14.7   15.3   11.4    ?Hematocrit 39.0 - 52.0 % 43.7   45.1   32.6    ?Platelets 150.0 - 400.0 K/uL 190.0   197   179    ? ? ?No results found for: VD25OH ? ?Clinical ASCVD: No  ?The 10-year ASCVD risk score (Arnett DK, et al., 2019) is: 24.6% ?  Values used to calculate the score: ?    Age: 39 years ?    Sex: Male ?    Is Non-Hispanic African American: No ?    Diabetic: No ?    Tobacco smoker: Yes ?    Systolic Blood Pressure: 923 mmHg ?    Is BP treated: No ?    HDL Cholesterol: 37.1 mg/dL ?    Total Cholesterol: 182 mg/dL   ? ? ?  10/02/2018  ?  9:34 AM 10/03/2017  ?  2:46 PM 09/28/2017  ?  8:59 AM  ?Depression screen PHQ 2/9  ?Decreased Interest 0 0 0  ?Down, Depressed, Hopeless 0  0  ?PHQ - 2 Score 0 0 0  ?Altered sleeping 2 0 3  ?Tired, decreased energy 0  3  ?Change in appetite 2  3  ?Feeling bad or failure about yourself  2  2  ?Trouble concentrating 2  3  ?Moving slowly or  fidgety/restless 0  0  ?Suicidal thoughts 0  0  ?PHQ-9 Score 8 0 14  ?Difficult doing work/chores Not difficult at all  Somewhat difficult  ?  ? ?Social History  ? ?Tobacco Use  ?Smoking Status Never  ?Smokeless Tobacco Former  ? ?BP Readings from Last 3 Encounters:  ?12/29/20 122/80  ?12/10/20 (!) 160/78  ?12/09/20 120/78  ? ?Pulse Readings from Last 3 Encounters:  ?  12/29/20 79  ?12/10/20 85  ?12/09/20 85  ? ?Wt Readings from Last 3 Encounters:  ?12/29/20 182 lb (82.6 kg)  ?12/10/20 182 lb (82.6 kg)  ?12/09/20 181 lb 8 oz (82.3 kg)  ? ?BMI Readings from Last 3 Encounters:  ?12/29/20 26.88 kg/m?  ?12/10/20 27.67 kg/m?  ?12/09/20 26.80 kg/m?  ? ? ?Assessment/Interventions: Review of patient past medical history, allergies, medications, health status, including review of consultants reports, laboratory and other test data, was performed as part of comprehensive evaluation and provision of chronic care management services.  ? ?SDOH:  (Social Determinants of Health) assessments and interventions performed: Yes ?SDOH Interventions   ? ?Flowsheet Row Most Recent Value  ?SDOH Interventions   ?Food Insecurity Interventions Intervention Not Indicated  ?Financial Strain Interventions Intervention Not Indicated  ? ?  ? ?SDOH Screenings  ? ?Alcohol Screen: Not on file  ?Depression (PHQ2-9): Not on file  ?Financial Resource Strain: Low Risk   ? Difficulty of Paying Living Expenses: Not hard at all  ?Food Insecurity: No Food Insecurity  ? Worried About Charity fundraiser in the Last Year: Never true  ? Ran Out of Food in the Last Year: Never true  ?Housing: Not on file  ?Physical Activity: Not on file  ?Social Connections: Not on file  ?Stress: Not on file  ?Tobacco Use: Medium Risk  ? Smoking Tobacco Use: Never  ? Smokeless Tobacco Use: Former  ? Passive Exposure: Not on file  ?Transportation Needs: Not on file  ? ? ?[place] ? ?Allergies  ?Allergen Reactions  ? Clonazepam Other (See Comments) and Photosensitivity  ?   Worsening mood/depression ?"Over-sedation" per pt.  ? Morphine And Related Other (See Comments)  ?  hallucinations  ? Prednisone Other (See Comments)  ?  Unclear reaction  ? Tramadol   ?  Worsening balance  ? ? ?[facility]

## 2021-07-26 ENCOUNTER — Ambulatory Visit: Payer: Medicare PPO | Admitting: Physical Therapy

## 2021-07-26 ENCOUNTER — Ambulatory Visit: Payer: Medicare PPO | Admitting: Occupational Therapy

## 2021-07-27 NOTE — Patient Instructions (Signed)
Visit Information ? ?Phone number for Pharmacist: [phone] ? ?Thank you for meeting with me to discuss your medications! I look forward to working with you to achieve your health care goals. Below is a summary of what we talked about during the visit: ? ? Goals Addressed   ?None ?  ? ? ?Care Plan : [place]  ?Updates made by Charlton Haws, RPH since 07/27/2021 12:00 AM  ?  ? ?Problem: Hyperlipidemia, GERD, Depression, and Anxiety, Dementia, Parkinsons   ?Priority: High  ?  ? ?Long-Range Goal: Disease mgmt   ?Start Date: 07/27/2021  ?Expected End Date: 07/28/2022  ?This Visit's Progress: On track  ?Priority: High  ?Note:   ?Current Barriers:  ?None identified ? ?Pharmacist Clinical Goal(s):  ?Patient will contact provider office for questions/concerns as evidenced notation of same in electronic health record through collaboration with PharmD and provider.  ? ?Interventions: ?1:1 collaboration with Tonia Ghent, MD regarding development and update of comprehensive plan of care as evidenced by provider attestation and co-signature ?Inter-disciplinary care team collaboration (see longitudinal plan of care) ?Comprehensive medication review performed; medication list updated in electronic medical record ? ?Orthostasis (SBP goal > 100) ?-Controlled - pt has not needed midodrine in a long time ?-Current treatment: ?Midodrine 5 mg PRN - Appropriate, Effective, Safe, Accessible ?-Medications previously tried: n/a ?-Denies hypotensive/hypertensive symptoms ?-Educated on Symptoms of hypotension and importance of maintaining adequate hydration; ?-Counseled to monitor BP at home periodically ? ?Hyperlipidemia: (LDL goal < 100) ?-Not ideally controlled - LDL 109 (10/2020) slightly elevated ?-ASCVD 10-yr risk 20% (high); pt was on pravastatin years ago but it was stopped due leg weakness/gait instabilty, it was decided to forego statin therapy due to risks > benefits ?-Current treatment: ?Aspirin 81 mg  daily - Appropriate, Effective, Safe, Accessible ?-Medications previously tried: pravastatin  ?-Educated on Cholesterol goals;  ?-Counseled on diet and exercise extensively ? ?Parkinson's Disease (Goal: manage symptoms) ?-Controlled ?-Undergoing PT/OT at [facility]. Follows with Dr Tillman Sers ([place]). Parkinson's x 20 years ?-Current treatment  ?Rytary ER 23.75-95 mg 4 tab TID - Appropriate, Effective, Safe, Accessible ?Rivastigmine patch 9.5 mg/24hr - Appropriate, Effective, Safe, Accessible ?-Medications previously tried: n/a  ?-Recommended to continue current medication ? ?Dementia (Goal: slow progression) ?-Controlled - per wife, pt's memory is not good and they are not sure how much memantine is helping ?-Current treatment  ?Memantine 10 mg BID -Appropriate, Effective, Safe, Accessible ?-Medications previously tried: n/a  ?-Reviewed indication for memantine and benefits for slowing cognitive decline, not improving memory ?-Recommended to continue current medication ? ?Depression (Goal: manage symptoms) ?-Controlled ?-PHQ9: 8 (09/2018) - mild depression ?-Connected with PCP, LCSW Bambi Cottle for mental health support ?-Current treatment: ?Escitalopram 10 mg daily - Appropriate, Effective, Safe, Accessible ?-Medications previously tried/failed: n/a ?-Educated on Benefits of medication for symptom control ?-Recommended to continue current medication ? ?GERD (Goal: manage symptoms) ?-Controlled ?-Current treatment  ?Omeprazole 20 mg daily -Appropriate, Effective, Safe, Accessible ?-Medications previously tried: n/a  ?-Recommended to continue current medication ? ?Health Maintenance ?-Vaccine gaps: none ?-Current therapy:  ?Vitamin D 1000 IU ?Cranberry 200 mg ?Melatonin 10 mg ?Naproxen 220 mg ?Tylenol PRN ?-Advised to limit NSAID use as much as possible due to risks for high BP, kidney function, ? ?Patient Goals/Self-Care Activities ?Patient will:  ?- take medications as prescribed as evidenced by patient report and record  review ?focus on medication adherence by pill box ?  ?  ? ?Mr. Courington was given information about Chronic Care Management services today  including:  ?CCM service includes personalized support from designated clinical staff supervised by his physician, including individualized plan of care and coordination with other care providers ?24/7 contact phone numbers for assistance for urgent and routine care needs. ?Standard insurance, coinsurance, copays and deductibles apply for chronic care management only during months in which we provide at least 20 minutes of these services. Most insurances cover these services at 100%, however patients may be responsible for any copay, coinsurance and/or deductible if applicable. This service may help you avoid the need for more expensive face-to-face services. ?Only one practitioner may furnish and bill the service in a calendar month. ?The patient may stop CCM services at any time (effective at the end of the month) by phone call to the office staff. ? ?Patient agreed to services and verbal consent obtained.  ? ?Patient verbalizes understanding of instructions and care plan provided today and agrees to view in [place]. Active MyChart status confirmed with patient.   ?Telephone follow up appointment with pharmacy team member scheduled for: 1 year ? ?Charlene Brooke, PharmD, BCACP ?Clinical Pharmacist ?[facility] ?[phone]  ?

## 2021-07-28 ENCOUNTER — Ambulatory Visit: Payer: Medicare PPO | Admitting: Physical Therapy

## 2021-07-28 ENCOUNTER — Ambulatory Visit: Payer: Medicare PPO

## 2021-07-28 VITALS — BP 134/82 | HR 70

## 2021-07-28 DIAGNOSIS — R278 Other lack of coordination: Secondary | ICD-10-CM

## 2021-07-28 DIAGNOSIS — R262 Difficulty in walking, not elsewhere classified: Secondary | ICD-10-CM

## 2021-07-28 DIAGNOSIS — R296 Repeated falls: Secondary | ICD-10-CM | POA: Diagnosis not present

## 2021-07-28 DIAGNOSIS — R2681 Unsteadiness on feet: Secondary | ICD-10-CM | POA: Diagnosis not present

## 2021-07-28 DIAGNOSIS — M6281 Muscle weakness (generalized): Secondary | ICD-10-CM | POA: Diagnosis not present

## 2021-07-28 DIAGNOSIS — R269 Unspecified abnormalities of gait and mobility: Secondary | ICD-10-CM | POA: Diagnosis not present

## 2021-07-28 DIAGNOSIS — G20 Parkinson's disease: Secondary | ICD-10-CM | POA: Diagnosis not present

## 2021-07-28 NOTE — Therapy (Signed)
[place] [facility] ?[place], [place], [address] ?Phone: [phone]   Fax:  [phone] ? ?Physical Therapy Treatment ? ?Patient Details  ?Name: [name] ?MRN: [id number] ?Date of Birth: December 14, 1950 ?Referring Provider (PT): George Stain, MD ? ? ?Encounter Date: 07/28/2021 ? ? PT End of Session - 07/28/21 1026   ? ? Visit Number 66   ? Number of Visits 62   ? Date for PT Re-Evaluation 08/23/21   ? Authorization Type [employer] Medicare Choice PPO   ? Progress Note Due on Visit 60   ? Equipment Utilized During Treatment Gait belt   ? Activity Tolerance Patient tolerated treatment well   ? Behavior During Therapy [facility] for tasks assessed/performed   ? ?  ?  ? ?  ? ? ?Past Medical History:  ?Diagnosis Date  ? Arthritis   ? Bradycardia   ? Cancer [facility]) 2013  ? skin cancer  ? Depression   ? ptsd  ? Dysrhythmia   ? chronic slow heart rate  ? GERD (gastroesophageal reflux disease)   ? Headache(784.0)   ? tension headaches non recent  ? History of chicken pox   ? History of kidney stones   ? passed  ? Hypertension   ? treated with HCTZ  ? Pacemaker   ? Parkinson's disease ([place])   ? dx'ed 15 years ago  ? PTSD (post-traumatic stress disorder)   ? Shortness of breath dyspnea   ? Sleep apnea   ? doesn't use C-pap  ? Varicose veins   ? ? ?Past Surgical History:  ?Procedure Laterality Date  ? CHOLECYSTECTOMY N/A 10/22/2014  ? Procedure: LAPAROSCOPIC CHOLECYSTECTOMY WITH INTRAOPERATIVE CHOLANGIOGRAM;  Surgeon: Dia Crawford III, MD;  Location: [facility] ORS;  Service: General;  Laterality: N/A;  ? cyst removed     ? from lip as a child  ? INTRAMEDULLARY (IM) NAIL INTERTROCHANTERIC N/A 02/18/2019  ? Procedure: INTRAMEDULLARY (IM) NAIL INTERTROCHANTRIC, RIGHT,;  Surgeon: Thornton Park, MD;  Location: [facility] ORS;  Service: Orthopedics;  Laterality: N/A;  ? LUMBAR LAMINECTOMY/DECOMPRESSION MICRODISCECTOMY Bilateral 12/14/2012  ? Procedure: Bilateral lumbar three-four, four-five  decompressive laminotomy/foraminotomy;  Surgeon: Charlie Pitter, MD;  Location: [place] NEURO ORS;  Service: Neurosurgery;  Laterality: Bilateral;  ? PULSE GENERATOR IMPLANT Bilateral 12/13/2013  ? Procedure: Bilateral implantable pulse generator placement;  Surgeon: Erline Levine, MD;  Location: [place] NEURO ORS;  Service: Neurosurgery;  Laterality: Bilateral;  Bilateral implantable pulse generator placement  ? skin cancer removed    ? from ears,   12 lft arm  rt leg 15  ? SUBTHALAMIC STIMULATOR BATTERY REPLACEMENT Bilateral 07/14/2017  ? Procedure: BILATERAL IMPLANTED PULSE GENERATOR CHANGE FOR DEEP BRAIN STIMULATOR;  Surgeon: Erline Levine, MD;  Location: [place];  Service: Neurosurgery;  Laterality: Bilateral;  ? SUBTHALAMIC STIMULATOR INSERTION Bilateral 12/06/2013  ? Procedure: SUBTHALAMIC STIMULATOR INSERTION;  Surgeon: Erline Levine, MD;  Location: [place] NEURO ORS;  Service: Neurosurgery;  Laterality: Bilateral;  Bilateral deep brain stimulator placement  ? ? ?There were no vitals filed for this visit. ? ? Subjective Assessment - 07/28/21 1022   ? ? Subjective Pt reports 1 fall over the weekend when walking down a ramp that had no rails and making th eturn too quickly. Pt reports some shoulder and back pain following that has been improving but is still has some pain and discomfort.   ? Patient is accompained by: Family member   ? Pertinent History [name] is a 83yoM who comes  to [facility] neuro for evaluation of conitnued difficulty of imbalance, unsteadiness on feet, and generally limited tolerance to mobility in the setting of chronic parkinsonism. SInce DC in February, pt has had PPM placement due to bradycardia issues. Pt also started using an UpWalker device 2 week prior to this evaluation, goal was to help with gradual melting of postural extension that occured with sustained upright actiivty. Pt has continued to practice supervised AMB with caregivers, short distances out of house and in community. Pt continues to  experience frequent falls on a weekly basis, at a rate that both he and wife report to be increasing in frequency. PMH: HTN, bradycardia s/p PPM, PD, DBS, COVID-19 infection, chronic low back pain s/p L4/5 surgery.   ? Limitations Walking;Standing;Sitting;House hold activities;Lifting   ? How long can you sit comfortably? Not limited   ? How long can you stand comfortably? 5 minutes with support   ? How long can you walk comfortably? ~5-6 minutes [name]   ? Patient Stated Goals Reduce falls, improve AMB tolerance   ? Currently in Pain? Yes   ? Pain Score 6    ? Pain Location Back   ? Pain Orientation Lower   ? Pain Descriptors / Indicators Aching   ? Pain Type Chronic pain   ? Multiple Pain Sites Yes   ? Pain Score 6   ? Pain Location Shoulder   ? Pain Orientation Left   ? Pain Descriptors / Indicators Throbbing   ? Pain Type Chronic pain   ? Pain Onset More than a month ago   ? Pain Frequency Intermittent   ? Aggravating Factors  recent fall   ? ?  ?  ? ?  ? ? ?Exercise/Activity Sets/Reps/Time/ Resistance Assistance Charge type Comments- Unless otherwise stated, CGA was provided and gait belt donned in order to ensure pt safety ?  ?Leg press  *10'@85'$ # ?2*10'@100'$ # ?*10'@115'$  Set up  therex Assistance for LE positioning throughout and cues for maintenance of positioing   ?Ball roll outs  5 x 5 sec hold 3 directions   therex To improve shoulder and low back mobility secondary to pain and soreness in these area prior to session.   ?Turning training with up walker  X 2 laps through  NMR [place] spaced close enough for up walker but not much more room to encourage close proximity to walker  ?Good efficacy and turning ability noted.   ?      ?      ?      ?      ?      ?PRW! Step to airex pad and over hurdle  (external cue for step length)  2 x 10   CGA  NMR  Pad for increased balance challenge as well as for external cue for step length. Cues for proper UE movement.  ?-  less hurdle contact this session, increased  difficulty when using R LE for stance LE  ?Cues for not utilizing UE for support   ?      ?      ?Treatment Provided this session  ? ?Pt educated throughout session about proper posture and technique with exercises. Improved exercise technique, movement at target joints, use of target muscles after min to mod verbal, visual, tactile cues. ? ?Note: Portions of this document were prepared using Dragon voice recognition software and although reviewed may contain unintentional dictation errors in syntax, grammar, or spelling. ? ? ? ? ? ? ? ? ? ? ? ? ? ? ? ? ? ? ? ? ? ? ? ? ? ? ?  PT Education - 07/28/21 1025   ? ? Education provided Yes   ? Education Details exerciuse technique   ? Person(s) Educated Patient   ? Methods Explanation   ? Comprehension Verbalized understanding   ? ?  ?  ? ?  ? ? ? PT Short Term Goals - 07/12/21 1107   ? ?  ? PT SHORT TERM GOAL #1  ? Title Pt to demonstrate improved sustained AMB tolerance >856f.   ? Baseline At evlauation: 6076ftolerance (not to failure); 9/26: 134196t   ? Time 4   ? Period Weeks   ? Status Achieved   ? Target Date 01/05/21   ?  ? PT SHORT TERM GOAL #2  ? Title Pt will improve BERG balance score to greater than 45 to indicate decrease risk of falls.   ? Baseline 2/13:44   ? Time 4   ? Period Weeks   ? Status Achieved   ? Target Date 06/28/21   ?  ? PT SHORT TERM GOAL #3  ? Title Pt to demonstrate 10MWT<13sec to show self-selected gait speed appropriate for AMB outside of house and to improve energy efficiency.   ? Baseline UpWalker Eval: 17.53sec; 9/26: 0.74 m/s (13.5)11/21: 13.68 (.73 m/s) 12.64 on 12/28 9.88 pn2/13   ? Time 4   ? Period Weeks   ? Status Achieved   ? Target Date 02/08/21   ? ?  ?  ? ?  ? ? ? ? PT Long Term Goals - 07/12/21 1125   ? ?  ? PT LONG TERM GOAL #1  ? Title Pt will improve BERG balance score to greater than 47 to indicate decreaed risk of falls.   ? Baseline 41 on 12/28 44 on 2/13 45 on 3/27   ? Time 8   ? Period Weeks   ? Status On-going   ?  Target Date 07/26/21   ?  ? PT LONG TERM GOAL #2  ? Title Pt to tolerate 100033fustained AMB with UpWalker without rest interval to improve safety and independence in limited community AMB.   ? Baseline At eval;

## 2021-07-29 DIAGNOSIS — M47817 Spondylosis without myelopathy or radiculopathy, lumbosacral region: Secondary | ICD-10-CM | POA: Diagnosis not present

## 2021-07-29 NOTE — Therapy (Signed)
[place] [facility] ?[place], [place], [address] ?Phone: [phone]   Fax:  [phone] ? ?Occupational Therapy Treatment ? ?Patient Details  ?Name: [name] ?MRN: [id number] ?Date of Birth: 12/30/1950 ?Referring Provider (OT): Dr. Sharolyn Douglas (neurologist at [facility]) ? ? ?Encounter Date: 07/28/2021 ? ? OT End of Session - 07/29/21 0958   ? ? Visit Number 19   ? Number of Visits 24   ? Date for OT Re-Evaluation 08/03/21   ? OT Start Time 1100   ? OT Stop Time 1145   ? OT Time Calculation (min) 45 min   ? Equipment Utilized During Treatment rollator   ? Activity Tolerance Patient tolerated treatment well   ? Behavior During Therapy [facility] for tasks assessed/performed   ? ?  ?  ? ?  ? ? ?Past Medical History:  ?Diagnosis Date  ? Arthritis   ? Bradycardia   ? Cancer [facility]) 2013  ? skin cancer  ? Depression   ? ptsd  ? Dysrhythmia   ? chronic slow heart rate  ? GERD (gastroesophageal reflux disease)   ? Headache(784.0)   ? tension headaches non recent  ? History of chicken pox   ? History of kidney stones   ? passed  ? Hypertension   ? treated with HCTZ  ? Pacemaker   ? Parkinson's disease ([place])   ? dx'ed 15 years ago  ? PTSD (post-traumatic stress disorder)   ? Shortness of breath dyspnea   ? Sleep apnea   ? doesn't use C-pap  ? Varicose veins   ? ? ?Past Surgical History:  ?Procedure Laterality Date  ? CHOLECYSTECTOMY N/A 10/22/2014  ? Procedure: LAPAROSCOPIC CHOLECYSTECTOMY WITH INTRAOPERATIVE CHOLANGIOGRAM;  Surgeon: Dia Crawford III, MD;  Location: [facility] ORS;  Service: General;  Laterality: N/A;  ? cyst removed     ? from lip as a child  ? INTRAMEDULLARY (IM) NAIL INTERTROCHANTERIC N/A 02/18/2019  ? Procedure: INTRAMEDULLARY (IM) NAIL INTERTROCHANTRIC, RIGHT,;  Surgeon: Thornton Park, MD;  Location: [facility] ORS;  Service: Orthopedics;  Laterality: N/A;  ? LUMBAR LAMINECTOMY/DECOMPRESSION MICRODISCECTOMY Bilateral 12/14/2012  ? Procedure: Bilateral lumbar  three-four, four-five decompressive laminotomy/foraminotomy;  Surgeon: Charlie Pitter, MD;  Location: [place] NEURO ORS;  Service: Neurosurgery;  Laterality: Bilateral;  ? PULSE GENERATOR IMPLANT Bilateral 12/13/2013  ? Procedure: Bilateral implantable pulse generator placement;  Surgeon: Erline Levine, MD;  Location: [place] NEURO ORS;  Service: Neurosurgery;  Laterality: Bilateral;  Bilateral implantable pulse generator placement  ? skin cancer removed    ? from ears,   12 lft arm  rt leg 15  ? SUBTHALAMIC STIMULATOR BATTERY REPLACEMENT Bilateral 07/14/2017  ? Procedure: BILATERAL IMPLANTED PULSE GENERATOR CHANGE FOR DEEP BRAIN STIMULATOR;  Surgeon: Erline Levine, MD;  Location: [place];  Service: Neurosurgery;  Laterality: Bilateral;  ? SUBTHALAMIC STIMULATOR INSERTION Bilateral 12/06/2013  ? Procedure: SUBTHALAMIC STIMULATOR INSERTION;  Surgeon: Erline Levine, MD;  Location: [place] NEURO ORS;  Service: Neurosurgery;  Laterality: Bilateral;  Bilateral deep brain stimulator placement  ? ? ?There were no vitals filed for this visit. ? ? Subjective Assessment - 07/28/21 0955   ? ? Subjective  Pt [place].   ? Patient is accompanied by: Family member   ? Pertinent History Parkinson's Disease Diagnosed in 2000, frequent falls   ? Limitations UE/LE tremors, decreased balance, lack of coordination, intermittent diplopia (has prism glasses)   ? Patient Stated Goals Pt/spouse  verbalize desire to improve [facility] skills for holding playing cards, and improving shoulder flexibility and posture for better walker use.   ? Currently in Pain? Yes   ? Pain Score 6    ? Pain Location Back   ? Pain Orientation Lower   ? Pain Descriptors / Indicators Aching   ? Pain Type Chronic pain   ? Pain Radiating Towards low back/L hip   ? Pain Onset More than a month ago   ? Pain Frequency Intermittent   ? Aggravating Factors  early morning, later afternoon, walking, transfers   ? Pain Relieving Factors rest, heat   ? Effect  of Pain on Daily Activities difficulty with ADLs and walking   ? Multiple Pain Sites Yes   ? Pain Score 6   ? Pain Location Shoulder   ? Pain Orientation Left   ? Pain Descriptors / Indicators Aching;Throbbing   ? Pain Type Acute pain   ? Pain Onset In the past 7 days   ? Pain Frequency Intermittent   ? Aggravating Factors  recent fall   ? Pain Relieving Factors rest, heat   ? Effect of Pain on Daily Activities general soreness with UB ADLs   ? ?  ?  ? ?  ? ?Occupational Therapy Treatment: ?Self Care: ?Assisted pt to finish entering therapy schedule and other important dates on phone calendar, setting alerts on phone to notify of his upcoming events the day before and morning of d/t STM deficits.  Pt utilized his stylus and entered data above to promote [facility] skills, OT provided sequencing cues to navigate calendar entries.  Pt requires extra time with navigation and often multiple attempts at hitting keys accurately with stylus.  Occasional cuing for stylus positioning or using a quicker and larger swipe with the stylus for efficient navigation.  ? ?Response to Treatment: ?See Plan/clinical impression below. ? ? ? OT Education - 07/28/21 0958   ? ? Education Details cell phone use with calendar and texting features   ? Person(s) Educated Patient   ? Methods Explanation;Verbal cues;Demonstration   ? Comprehension Verbalized understanding;Returned demonstration;Verbal cues required   ? ?  ?  ? ?  ? ? ? OT Short Term Goals - 06/21/21 1143   ? ?  ? OT SHORT TERM GOAL #1  ? Title Pt/caregiver will be indep with HEP   ? Baseline Eval: not yet initiated; Pt has been consistent to send text messages as part of his home activity program/ issued theraputty today with instruction in coordination exercises, further training needed   ? Time 6   ? Period Weeks   ? Status Partially Met   ? Target Date 06/22/21   ?  ? OT SHORT TERM GOAL #2  ? Title --   ? Baseline Eval: R 41 sec, L 41 sec; 06/21/21: R 41 sec, L 34 sec   ? Time --   ?  Status On-going   ? Target Date --   ? ?  ?  ? ?  ? ? ? ? OT Long Term Goals - 06/21/21 1150   ? ?  ? OT LONG TERM GOAL #1  ? Title Pt will improve FOTO score to 40 or better to indicate increased functional improvement.   ? Baseline Eval: FOTO 34; 06/21/21: FOTO 40   ? Time 12   ? Period Weeks   ? Status On-going   ? Target Date 08/03/21   ?  ? OT LONG TERM GOAL #2  ?  Title Pt will improve R lateral pinch strength to be able to tolerate holding playing cards in R dominant hand for game of cards.   ? Baseline Eval: R lateral pinch 16 lbs; pt enjoys playing cards but is not currently playing d/t decreased strength/coordination; 06/21/21: R 25, L 19 (pt also purchased card holder to compensate for "off" days or times)   ? Time 12   ? Period Weeks   ? Status On-going   ? Target Date 08/03/21   ?  ? OT LONG TERM GOAL #3  ? Title Pt will improve R dominant hand to mouth coordination to self feed with minimal spilling and utilize adapted utensils as needed.   ? Baseline Eval: Spouse reports eating is messy and pt struggles to keep utensil level; 06/21/21: Pt uses long handled teaspoon; OT encouraged applying foam to build up handle as pt reports it's becoming more difficult to grip.  Pt reports stuggle with hand to mouth coordination and continues to spill.   ? Time 12   ? Period Weeks   ? Status On-going   ? Target Date 08/03/21   ?  ? OT LONG TERM GOAL #4  ? Title Pt will improve bilat shoulder flex/abd for improved overhead reach during self care.   ? Baseline Eval: R shoulder flex 110, L 120, R abd 125, L 165; R shoulder flex; 06/21/21: R flex 115, L 143; R abd 144, L 170   ? Time 12   ? Period Weeks   ? Status On-going   ? Target Date 08/03/21   ?  ? OT LONG TERM GOAL #5  ? Title Pt will improve [place] in bilat hands as noted by improvement in 9 hole peg test score by 5 or more seconds to enable participate in family board games.   ? Baseline Eval: R/L 9 hole peg test 41 secs (pt has not recently participated in board games);  06/21/21: R 41 sec, L 34 sec   ? Time 12   ? Period Weeks   ? Status Partially Met   ? Target Date 08/03/21   ?  ? OT LONG TERM GOAL #6  ? Title Pt will be able to initiate simple texts and reply to texts on pho

## 2021-08-02 ENCOUNTER — Encounter: Payer: Self-pay | Admitting: Occupational Therapy

## 2021-08-02 ENCOUNTER — Ambulatory Visit: Payer: Medicare PPO | Admitting: Occupational Therapy

## 2021-08-02 DIAGNOSIS — M6281 Muscle weakness (generalized): Secondary | ICD-10-CM

## 2021-08-02 DIAGNOSIS — R278 Other lack of coordination: Secondary | ICD-10-CM | POA: Diagnosis not present

## 2021-08-02 DIAGNOSIS — R269 Unspecified abnormalities of gait and mobility: Secondary | ICD-10-CM | POA: Diagnosis not present

## 2021-08-02 DIAGNOSIS — R296 Repeated falls: Secondary | ICD-10-CM | POA: Diagnosis not present

## 2021-08-02 DIAGNOSIS — G20 Parkinson's disease: Secondary | ICD-10-CM | POA: Diagnosis not present

## 2021-08-02 DIAGNOSIS — R262 Difficulty in walking, not elsewhere classified: Secondary | ICD-10-CM | POA: Diagnosis not present

## 2021-08-02 DIAGNOSIS — R2681 Unsteadiness on feet: Secondary | ICD-10-CM | POA: Diagnosis not present

## 2021-08-02 NOTE — Therapy (Signed)
[place] [facility] ?[place], [place], [address] ?Phone: [phone]   Fax:  [phone] ? ?Occupational Therapy Treatment/Discharge Note ? ?Patient Details  ?Name: [name] ?MRN: [id number] ?Date of Birth: 08-05-1950 ?Referring Provider (OT): Dr. Sharolyn Douglas (neurologist at [facility]) ? ? ?Encounter Date: 08/02/2021 ? ? OT End of Session - 08/02/21 1036   ? ? Visit Number 20   ? Number of Visits 24   ? Date for OT Re-Evaluation 08/03/21   ? OT Start Time 1015   ? OT Stop Time 1055   ? OT Time Calculation (min) 40 min   ? Equipment Utilized During Treatment rollator   ? Activity Tolerance Patient tolerated treatment well   ? Behavior During Therapy [facility] for tasks assessed/performed   ? ?  ?  ? ?  ? ? ?Past Medical History:  ?Diagnosis Date  ? Arthritis   ? Bradycardia   ? Cancer [facility]) 2013  ? skin cancer  ? Depression   ? ptsd  ? Dysrhythmia   ? chronic slow heart rate  ? GERD (gastroesophageal reflux disease)   ? Headache(784.0)   ? tension headaches non recent  ? History of chicken pox   ? History of kidney stones   ? passed  ? Hypertension   ? treated with HCTZ  ? Pacemaker   ? Parkinson's disease ([place])   ? dx'ed 15 years ago  ? PTSD (post-traumatic stress disorder)   ? Shortness of breath dyspnea   ? Sleep apnea   ? doesn't use C-pap  ? Varicose veins   ? ? ?Past Surgical History:  ?Procedure Laterality Date  ? CHOLECYSTECTOMY N/A 10/22/2014  ? Procedure: LAPAROSCOPIC CHOLECYSTECTOMY WITH INTRAOPERATIVE CHOLANGIOGRAM;  Surgeon: Dia Crawford III, MD;  Location: [facility] ORS;  Service: General;  Laterality: N/A;  ? cyst removed     ? from lip as a child  ? INTRAMEDULLARY (IM) NAIL INTERTROCHANTERIC N/A 02/18/2019  ? Procedure: INTRAMEDULLARY (IM) NAIL INTERTROCHANTRIC, RIGHT,;  Surgeon: Thornton Park, MD;  Location: [facility] ORS;  Service: Orthopedics;  Laterality: N/A;  ? LUMBAR LAMINECTOMY/DECOMPRESSION MICRODISCECTOMY Bilateral 12/14/2012  ? Procedure: Bilateral  lumbar three-four, four-five decompressive laminotomy/foraminotomy;  Surgeon: Charlie Pitter, MD;  Location: [place] NEURO ORS;  Service: Neurosurgery;  Laterality: Bilateral;  ? PULSE GENERATOR IMPLANT Bilateral 12/13/2013  ? Procedure: Bilateral implantable pulse generator placement;  Surgeon: Erline Levine, MD;  Location: [place] NEURO ORS;  Service: Neurosurgery;  Laterality: Bilateral;  Bilateral implantable pulse generator placement  ? skin cancer removed    ? from ears,   12 lft arm  rt leg 15  ? SUBTHALAMIC STIMULATOR BATTERY REPLACEMENT Bilateral 07/14/2017  ? Procedure: BILATERAL IMPLANTED PULSE GENERATOR CHANGE FOR DEEP BRAIN STIMULATOR;  Surgeon: Erline Levine, MD;  Location: [place];  Service: Neurosurgery;  Laterality: Bilateral;  ? SUBTHALAMIC STIMULATOR INSERTION Bilateral 12/06/2013  ? Procedure: SUBTHALAMIC STIMULATOR INSERTION;  Surgeon: Erline Levine, MD;  Location: [place] NEURO ORS;  Service: Neurosurgery;  Laterality: Bilateral;  Bilateral deep brain stimulator placement  ? ? ?There were no vitals filed for this visit. ? ? Subjective Assessment - 08/02/21 1207   ? ? Subjective  Pt [place] at the beach over the weekend.   ? Patient is accompanied by: Family member   ? Pertinent History Parkinson's Disease Diagnosed in 2000, frequent falls   ? Limitations UE/LE tremors, decreased balance, lack of coordination, intermittent diplopia (has prism glasses)   ? Currently in Pain?  No/denies   ? ?  ?  ? ?  ? ? ? ? ? [facility] OT Assessment - 08/02/21 0001   ? ?  ? Coordination  ? Right 9 Hole Peg Test 55 sec.   ? Left 9 Hole Peg Test 39 sec.   ?  ? Hand Function  ? Right Hand Lateral Pinch 25 lbs   ? Right Hand 3 Point Pinch 17 lbs   ? Left Hand Lateral Pinch 25 lbs   ? Left 3 point pinch 24 lbs   ? ?  ?  ? ?  ? ?Measurements were obtained, and goals were reviewed with the pt. Pt. Has made progress with right shoulder flexion, and has improved with functional reaching, posture in standing with the walker during  functional tasks. Pt. Has progressed with holding cards securely in his hand, and manipulating puzzle pieces. Pt.'s FOTO score is 40. Pt. has improved with self-feeding skills using adaptive utensils.  Pt. Continues to  have difficulty swiping his phone, and sending text messages consistently. Pt. Is now ready to discharge form OT services. Pt. Is in agreement.  ? ? ? ? ? ? ? ? ? ? ? ? ? ? ? ? ? OT Education - 08/02/21 1035   ? ? Education Details goals, POC, measurements, and discharge   ? Person(s) Educated Patient   ? Methods Explanation;Verbal cues;Demonstration   ? Comprehension Verbalized understanding;Returned demonstration;Verbal cues required   ? ?  ?  ? ?  ? ? ? OT Short Term Goals - 08/02/21 1041   ? ?  ? OT SHORT TERM GOAL #1  ? Title Pt/caregiver will be indep with HEP   ? Baseline Eval: not yet initiated; Pt has been consistent to send text messages as part of his home activity program/ issued theraputty today with instruction in coordination exercises, further training needed 08/02/2021: Pt. reports independence with Theraputtty HEP.   ? Time 6   ? Period Weeks   ? Status Achieved   ?  ? OT SHORT TERM GOAL #2  ? Title Pt will improve coordination for ADLs as shown by improving time on 9-hole peg test by at least 5sec with dominant RUE.   ? Baseline Eval: R 41 sec, L 41 sec; 06/21/21: R 41 sec, L 34 sec   ? Time 4   ? Period Weeks   ? Status Partially Met   ? ?  ?  ? ?  ? ? ? ? OT Long Term Goals - 08/02/21 1044   ? ?  ? OT LONG TERM GOAL #1  ? Title Pt will improve FOTO score to 40 or better to indicate increased functional improvement.   ? Baseline Eval: FOTO 34; 06/21/21: FOTO 40, 08/02/2021: 40   ? Time 12   ? Period Weeks   ? Status Achieved   ?  ? OT LONG TERM GOAL #2  ? Baseline Eval: R lateral pinch 16 lbs; pt enjoys playing cards but is not currently playing d/t decreased strength/coordination; 06/21/21: R 25, L 19 (pt also purchased card holder to compensate for "off" days or times) 08/02/2021:  R:  25, L: 25. Pt. reports improving with holding cards.   ? Time 12   ? Period Weeks   ? Status On-going   ?  ? OT LONG TERM GOAL #3  ? Title Pt will improve R dominant hand to mouth coordination to self feed with minimal spilling and utilize adapted utensils as needed.   ?  Baseline Eval: Spouse reports eating is messy and pt struggles to keep utensil level; 06/21/21: Pt uses long handled teaspoon; OT encouraged applying foam to build up handle as pt reports it's becoming more difficult to grip.  Pt reports stuggle with hand to mouth coordination and continues to spill. 08/02/2021: Pt. has improved with self-feeding using adaptive utensils.   ? Time 12   ? Period Weeks   ? Status On-going   ?  ? OT LONG TERM GOAL #4  ? Title Pt will improve bilat shoulder flex/abd for improved overhead reach during self care.   ? Baseline Eval: R shoulder flex 110, L 120, R abd 125, L 165; R shoulder flex; 06/21/21: R flex 115, L 143; R abd 144, L 170 08/02/2021: R flex: 140, L: 143, abd: R: 144 L: 170   ? Time 12   ? Period Weeks   ? Status On-going   ?  ? OT LONG TERM GOAL #5  ? Title Pt will improve [place] in bilat hands as noted by improvement in 9 hole peg test score by 5 or more seconds to enable participate in family board games. 08/02/2021: pt. is able to grasp puzzles.   ? Baseline Eval: R/L 9 hole peg test 41 secs (pt has not recently participated in board games); 06/21/21: R 41 sec, L 34 sec 08/02/2021: R: 55, L: 39   ? Time 12   ? Period Weeks   ?  ? OT LONG TERM GOAL #6  ? Title Pt will be able to initiate simple texts and reply to texts on phone with min vc.   ? Baseline Eval: Pt struggles with texting; 06/21/21: mod vc to initiate a simple text, min-mod vc to respond 08/02/2021: Texting remains difficult mostly cues for swiping, and finding contacts.   ? Time 12   ? Period Weeks   ? Status On-going   ?  ? OT LONG TERM GOAL #7  ? Title Pt will improve posture for ADLs and functional mobility as demonstrated by ability to keep feet within  back two legs of rollator 50% of the time with min vc.   ? Baseline Eval: pt with forward lean and feet are behind back 2 legs of walker during functional mobility; 06/21/21: pt keeps feet within back 2 legs

## 2021-08-03 ENCOUNTER — Ambulatory Visit: Payer: Medicare PPO | Admitting: Psychology

## 2021-08-03 ENCOUNTER — Encounter: Payer: Self-pay | Admitting: Family Medicine

## 2021-08-04 ENCOUNTER — Ambulatory Visit: Payer: Medicare PPO | Admitting: Physical Therapy

## 2021-08-04 ENCOUNTER — Ambulatory Visit: Payer: Medicare PPO

## 2021-08-04 ENCOUNTER — Encounter: Payer: Self-pay | Admitting: Physical Therapy

## 2021-08-04 DIAGNOSIS — M6281 Muscle weakness (generalized): Secondary | ICD-10-CM | POA: Diagnosis not present

## 2021-08-04 DIAGNOSIS — R262 Difficulty in walking, not elsewhere classified: Secondary | ICD-10-CM

## 2021-08-04 DIAGNOSIS — G20 Parkinson's disease: Secondary | ICD-10-CM | POA: Diagnosis not present

## 2021-08-04 DIAGNOSIS — R278 Other lack of coordination: Secondary | ICD-10-CM | POA: Diagnosis not present

## 2021-08-04 DIAGNOSIS — R2681 Unsteadiness on feet: Secondary | ICD-10-CM

## 2021-08-04 DIAGNOSIS — R296 Repeated falls: Secondary | ICD-10-CM

## 2021-08-04 DIAGNOSIS — R269 Unspecified abnormalities of gait and mobility: Secondary | ICD-10-CM

## 2021-08-04 NOTE — Patient Instructions (Signed)
Access Code: [email or web address] ?URL: [email or web address] ?Date: 08/04/2021 ?Prepared by: Rivka Barbara ? ?Exercises ?- Seated shoulder row while sitting on stability disc   - 1 x daily - 7 x weekly - 3 sets - 10 reps ?- Seated on stability disc long arc quads   - 1 x daily - 7 x weekly - 3 sets - 10 reps - 5 sec  hold ?- Sittong on stability disc horizontal abduction with thera band   - 1 x daily - 7 x weekly - 3 sets - 10 reps ?

## 2021-08-04 NOTE — Therapy (Signed)
[place] [facility] ?[place], [place], [address] ?Phone: [phone]   Fax:  [phone] ? ?Physical Therapy Treatment ? ?Patient Details  ?Name: [name] ?MRN: [id number] ?Date of Birth: 1950-05-23 ?Referring Provider (PT): Elsie Stain, MD ? ? ?Encounter Date: 08/04/2021 ? ? PT End of Session - 08/04/21 1113   ? ? Visit Number 11   ? Number of Visits 62   ? Date for PT Re-Evaluation 08/23/21   ? Authorization Type [employer] Choice PPO   ? Progress Note Due on Visit 60   ? PT Start Time 1103   ? PT Stop Time 1144   ? PT Time Calculation (min) 41 min   ? Equipment Utilized During Treatment Gait belt   ? Activity Tolerance Patient tolerated treatment well   ? Behavior During Therapy [facility] for tasks assessed/performed   ? ?  ?  ? ?  ? ? ?Past Medical History:  ?Diagnosis Date  ? Arthritis   ? Bradycardia   ? Cancer [facility]) 2013  ? skin cancer  ? Depression   ? ptsd  ? Dysrhythmia   ? chronic slow heart rate  ? GERD (gastroesophageal reflux disease)   ? Headache(784.0)   ? tension headaches non recent  ? History of chicken pox   ? History of kidney stones   ? passed  ? Hypertension   ? treated with HCTZ  ? Pacemaker   ? Parkinson's disease ([place])   ? dx'ed 15 years ago  ? PTSD (post-traumatic stress disorder)   ? Shortness of breath dyspnea   ? Sleep apnea   ? doesn't use C-pap  ? Varicose veins   ? ? ?Past Surgical History:  ?Procedure Laterality Date  ? CHOLECYSTECTOMY N/A 10/22/2014  ? Procedure: LAPAROSCOPIC CHOLECYSTECTOMY WITH INTRAOPERATIVE CHOLANGIOGRAM;  Surgeon: Dia Crawford III, MD;  Location: [facility] ORS;  Service: General;  Laterality: N/A;  ? cyst removed     ? from lip as a child  ? INTRAMEDULLARY (IM) NAIL INTERTROCHANTERIC N/A 02/18/2019  ? Procedure: INTRAMEDULLARY (IM) NAIL INTERTROCHANTRIC, RIGHT,;  Surgeon: Thornton Park, MD;  Location: [facility] ORS;  Service: Orthopedics;  Laterality: N/A;  ? LUMBAR LAMINECTOMY/DECOMPRESSION  MICRODISCECTOMY Bilateral 12/14/2012  ? Procedure: Bilateral lumbar three-four, four-five decompressive laminotomy/foraminotomy;  Surgeon: Charlie Pitter, MD;  Location: [place] NEURO ORS;  Service: Neurosurgery;  Laterality: Bilateral;  ? PULSE GENERATOR IMPLANT Bilateral 12/13/2013  ? Procedure: Bilateral implantable pulse generator placement;  Surgeon: Erline Levine, MD;  Location: [place] NEURO ORS;  Service: Neurosurgery;  Laterality: Bilateral;  Bilateral implantable pulse generator placement  ? skin cancer removed    ? from ears,   12 lft arm  rt leg 15  ? SUBTHALAMIC STIMULATOR BATTERY REPLACEMENT Bilateral 07/14/2017  ? Procedure: BILATERAL IMPLANTED PULSE GENERATOR CHANGE FOR DEEP BRAIN STIMULATOR;  Surgeon: Erline Levine, MD;  Location: [place];  Service: Neurosurgery;  Laterality: Bilateral;  ? SUBTHALAMIC STIMULATOR INSERTION Bilateral 12/06/2013  ? Procedure: SUBTHALAMIC STIMULATOR INSERTION;  Surgeon: Erline Levine, MD;  Location: [place] NEURO ORS;  Service: Neurosurgery;  Laterality: Bilateral;  Bilateral deep brain stimulator placement  ? ? ?There were no vitals filed for this visit. ? ? Subjective Assessment - 08/04/21 1107   ? ? Subjective Pt reports minimal back pain at this time. Pt reports he got his lower back injection about a week ago and it has been effective for his pain. Pt wife reports he has been leaning to the right a little  bit and he has had difficulty correcting this. Per PT suggestion pt will practice with dyna disc at home to continue to work on core and postural muscle activation.   ? Patient is accompained by: Family member   ? Pertinent History [name] is a 79yoM who comes to [facility] neuro for evaluation of conitnued difficulty of imbalance, unsteadiness on feet, and generally limited tolerance to mobility in the setting of chronic parkinsonism. SInce DC in February, pt has had PPM placement due to bradycardia issues. Pt also started using an UpWalker device 2 week prior to this evaluation,  goal was to help with gradual melting of postural extension that occured with sustained upright actiivty. Pt has continued to practice supervised AMB with caregivers, short distances out of house and in community. Pt continues to experience frequent falls on a weekly basis, at a rate that both he and wife report to be increasing in frequency. PMH: HTN, bradycardia s/p PPM, PD, DBS, COVID-19 infection, chronic low back pain s/p L4/5 surgery.   ? Limitations Walking;Standing;Sitting;House hold activities;Lifting   ? How long can you sit comfortably? Not limited   ? How long can you stand comfortably? 5 minutes with support   ? How long can you walk comfortably? ~5-6 minutes [name]   ? Patient Stated Goals Reduce falls, improve AMB tolerance   ? Currently in Pain? Yes   ? Pain Score 3    ? Pain Location Back   ? Pain Orientation Lower   ? Pain Descriptors / Indicators Aching   ? Pain Type Chronic pain   ? Pain Onset More than a month ago   ? ?  ?  ? ?  ? ? ? ? ?Exercise/Activity Sets/Reps/Time/ Resistance Assistance Charge type Comments- Unless otherwise stated, CGA was provided and gait belt donned in order to ensure pt safety ?  ?Leg press  *12'@85'$ # ?2*10'@100'$ # ?*10'@115'$  Set up  therex Assistance for LE positioning throughout and cues for maintenance of positioing   ?      ?      ?LAQ - sitting on dyna disc to improve postural muscle activation  2 z 10 x 5 sec   therex Cues for postural adjustments   ?Rows seated on dyna disc for core and postural training 2 x 10 with black TB  Therex Cues to prevent left lateral lean and for equal UE movement bilaterally   ?Horizontal abduction seated on dyna disc for core and postural training X 10 RTB   therex   ?      ?      ?PRW! Step to airex pad and over hurdle  (external cue for step length)  2 x 10   CGA  therex  Pad for increased balance challenge as well as for external cue for step length. Cues for proper UE movement.  ?-  less hurdle contact this session, increased  difficulty when using R LE for stance LE  ?Cues for not utilizing UE for support   ?      ?      ?Treatment Provided this session  ? ?Pt educated throughout session about proper posture and technique with exercises. Improved exercise technique, movement at target joints, use of target muscles after min to mod verbal, visual, tactile cues. ? ?Note: Portions of this document were prepared using Dragon voice recognition software and although reviewed may contain unintentional dictation errors in syntax, grammar, or spelling. ? ? ? ?Folloinwg reviewed and added to HEP  to work on postural muscle endurance  ? ? ?Access Code: [email or web address] ?URL: [email or web address] ?Date: 08/04/2021 ?Prepared by: Rivka Barbara ? ?Exercises ?- Seated shoulder row while sitting on stability disc   - 1 x daily - 7 x weekly - 3 sets - 10 reps ?- Seated on stability disc long arc quads   - 1 x daily - 7 x weekly - 3 sets - 10 reps - 5 sec  hold ?- Sittong on stability disc horizontal abduction with thera band   - 1 x daily - 7 x weekly - 3 sets - 10 reps ? ? ? ? ? ? ? ? ? ? ? ? ? ? ? ? ? ? ? PT Education - 08/04/21 1325   ? ? Education provided Yes   ? Education Details New HEP provided listed in PT instructions   ? Person(s) Educated Patient   ? Methods Explanation   ? Comprehension Verbalized understanding   ? ?  ?  ? ?  ? ? ? PT Short Term Goals - 07/12/21 1107   ? ?  ? PT SHORT TERM GOAL #1  ? Title Pt to demonstrate improved sustained AMB tolerance >841f.   ? Baseline At evlauation: 6063ftolerance (not to failure); 9/26: [id number]   ? Time 4   ? Period Weeks   ? Status Achieved   ? Target Date 01/05/21   ?  ? PT SHORT TERM GOAL #2  ? Title Pt will improve BERG balance score to greater than 45 to indicate decrease risk of falls.   ? Baseline 2/13:44   ? Time 4   ? Period Weeks   ? Status Achieved   ? Target Date 06/28/21   ?  ? PT SHORT TERM GOAL #3  ? Title Pt to demonstrate 10MWT<13sec to show self-selected gait speed  appropriate for AMB outside of house and to improve energy efficiency.   ? Baseline UpWalker Eval: 17.53sec; 9/26: 0.74 m/s (13.5)11/21: 13.68 (.73 m/s) 12.64 on 12/28 9.88 pn2/13   ? Time 4   ? Period Weeks   ? Status Ach

## 2021-08-06 DIAGNOSIS — Z9689 Presence of other specified functional implants: Secondary | ICD-10-CM | POA: Diagnosis not present

## 2021-08-06 DIAGNOSIS — G20 Parkinson's disease: Secondary | ICD-10-CM | POA: Diagnosis not present

## 2021-08-06 DIAGNOSIS — H532 Diplopia: Secondary | ICD-10-CM | POA: Diagnosis not present

## 2021-08-09 ENCOUNTER — Ambulatory Visit: Payer: Medicare PPO | Admitting: Occupational Therapy

## 2021-08-09 ENCOUNTER — Ambulatory Visit: Payer: Medicare PPO | Admitting: Physical Therapy

## 2021-08-09 ENCOUNTER — Encounter: Payer: Self-pay | Admitting: Physical Therapy

## 2021-08-09 DIAGNOSIS — R278 Other lack of coordination: Secondary | ICD-10-CM | POA: Diagnosis not present

## 2021-08-09 DIAGNOSIS — R262 Difficulty in walking, not elsewhere classified: Secondary | ICD-10-CM

## 2021-08-09 DIAGNOSIS — R2681 Unsteadiness on feet: Secondary | ICD-10-CM | POA: Diagnosis not present

## 2021-08-09 DIAGNOSIS — G20 Parkinson's disease: Secondary | ICD-10-CM | POA: Diagnosis not present

## 2021-08-09 DIAGNOSIS — M6281 Muscle weakness (generalized): Secondary | ICD-10-CM | POA: Diagnosis not present

## 2021-08-09 DIAGNOSIS — R296 Repeated falls: Secondary | ICD-10-CM | POA: Diagnosis not present

## 2021-08-09 DIAGNOSIS — R269 Unspecified abnormalities of gait and mobility: Secondary | ICD-10-CM | POA: Diagnosis not present

## 2021-08-09 NOTE — Therapy (Signed)
[facility] ?[place], [place], [address] ?Phone: [phone]   Fax:  [phone] ? ?Physical Therapy Treatment ? ?Patient Details  ?Name: [name] ?MRN: [id number] ?Date of Birth: 1950/08/24 ?Referring Provider (PT): Elsie Stain, MD ? ? ?Encounter Date: 08/09/2021 ? ? PT End of Session - 08/09/21 1116   ? ? Visit Number 43   ? Number of Visits 62   ? Date for PT Re-Evaluation 08/23/21   ? Authorization Type [employer] Medicare Choice PPO   ? Progress Note Due on Visit 60   ? PT Start Time 1103   ? PT Stop Time 1144   ? PT Time Calculation (min) 41 min   ? Equipment Utilized During Treatment Gait belt   ? Activity Tolerance Patient tolerated treatment well   ? Behavior During Therapy [facility] for tasks assessed/performed   ? ?  ?  ? ?  ? ? ?Past Medical History:  ?Diagnosis Date  ? Arthritis   ? Bradycardia   ? Cancer [facility]) 2013  ? skin cancer  ? Depression   ? ptsd  ? Dysrhythmia   ? chronic slow heart rate  ? GERD (gastroesophageal reflux disease)   ? Headache(784.0)   ? tension headaches non recent  ? History of chicken pox   ? History of kidney stones   ? passed  ? Hypertension   ? treated with HCTZ  ? Pacemaker   ? Parkinson's disease ([place])   ? dx'ed 15 years ago  ? PTSD (post-traumatic stress disorder)   ? Shortness of breath dyspnea   ? Sleep apnea   ? doesn't use C-pap  ? Varicose veins   ? ? ?Past Surgical History:  ?Procedure Laterality Date  ? CHOLECYSTECTOMY N/A 10/22/2014  ? Procedure: LAPAROSCOPIC CHOLECYSTECTOMY WITH INTRAOPERATIVE CHOLANGIOGRAM;  Surgeon: Dia Crawford III, MD;  Location: [facility] ORS;  Service: General;  Laterality: N/A;  ? cyst removed     ? from lip as a child  ? INTRAMEDULLARY (IM) NAIL INTERTROCHANTERIC N/A 02/18/2019  ? Procedure: INTRAMEDULLARY (IM) NAIL INTERTROCHANTRIC, RIGHT,;  Surgeon: Thornton Park, MD;  Location: [facility] ORS;  Service: Orthopedics;  Laterality: N/A;  ? LUMBAR LAMINECTOMY/DECOMPRESSION  MICRODISCECTOMY Bilateral 12/14/2012  ? Procedure: Bilateral lumbar three-four, four-five decompressive laminotomy/foraminotomy;  Surgeon: Charlie Pitter, MD;  Location: [place] [facility];  Service: Neurosurgery;  Laterality: Bilateral;  ? PULSE GENERATOR IMPLANT Bilateral 12/13/2013  ? Procedure: Bilateral implantable pulse generator placement;  Surgeon: Erline Levine, MD;  Location: [place] [facility];  Service: Neurosurgery;  Laterality: Bilateral;  Bilateral implantable pulse generator placement  ? skin cancer removed    ? from ears,   12 lft arm  rt leg 15  ? SUBTHALAMIC STIMULATOR BATTERY REPLACEMENT Bilateral 07/14/2017  ? Procedure: BILATERAL IMPLANTED PULSE GENERATOR CHANGE FOR DEEP BRAIN STIMULATOR;  Surgeon: Erline Levine, MD;  Location: [place];  Service: Neurosurgery;  Laterality: Bilateral;  ? SUBTHALAMIC STIMULATOR INSERTION Bilateral 12/06/2013  ? Procedure: SUBTHALAMIC STIMULATOR INSERTION;  Surgeon: Erline Levine, MD;  Location: [place] [facility] ORS;  Service: Neurosurgery;  Laterality: Bilateral;  Bilateral deep brain stimulator placement  ? ? ?There were no vitals filed for this visit. ? ? Subjective Assessment - 08/09/21 1107   ? ? Subjective Pt reports 1 fall over the weekend when was picking up a pile of things. Pt states he was bending over to do something when he took a step back and lost his balance. He did land in his transfer  chair which poked a hole in the wall.   ? Patient is accompained by: Family member   ? Pertinent History [name] is a 20yoM who comes to [facility] OPPT neuro for evaluation of conitnued difficulty of imbalance, unsteadiness on feet, and generally limited tolerance to mobility in the setting of chronic parkinsonism. SInce DC in February, pt has had PPM placement due to bradycardia issues. Pt also started using an UpWalker device 2 week prior to this evaluation, goal was to help with gradual melting of postural extension that occured with sustained upright actiivty. Pt has continued to practice  supervised AMB with caregivers, short distances out of house and in community. Pt continues to experience frequent falls on a weekly basis, at a rate that both he and wife report to be increasing in frequency. PMH: HTN, bradycardia s/p PPM, PD, DBS, COVID-19 infection, chronic low back pain s/p L4/5 surgery.   ? Limitations Walking;Standing;Sitting;House hold activities;Lifting   ? How long can you sit comfortably? Not limited   ? How long can you stand comfortably? 5 minutes with support   ? How long can you walk comfortably? ~5-6 minutes [name]   ? Patient Stated Goals Reduce falls, improve AMB tolerance   ? Currently in Pain? Yes   ? Pain Score 3    ? Pain Location Back   ? Pain Orientation Lower   ? Pain Descriptors / Indicators Stabbing   ? Pain Type Chronic pain   ? Pain Onset More than a month ago   ? ?  ?  ? ?  ? ? ?Exercise/Activity Sets/Reps/Time/ Resistance Assistance Charge type Comments- Unless otherwise stated, CGA was provided and gait belt donned in order to ensure pt safety ?  ?Leg press  *15'@85'$ # ?*12'@100'$ # ?*10'@115'$  ?2*8'@120'$ # Set up  therex Assistance for LE positioning throughout and cues for maintenance of positioing  ?Progressed weight and sets this session  ?R LE on airex, LLE on step  3 x 45 sec  CGA  NMR Cues for correct foot placement, some pain noted in R hip but pt notes this is chronic but seems to be limiting him in completing this   ?Sitting on dynadisc PWR! [place]  X 10 ea side  CGA NMR  Cues for increaseing weight shift, utilized contralateral UE   ?LAQ - sitting on dyna disc to improve postural muscle activation  2 * 10 x 5 sec x 5 #   therex Cues for postural adjustments   ?      ?      ?      ?      ?PRW! Step to airex pad and over hurdle  (external cue for step length)  1 x 10   CGA  therex  With L LE step over ( while relying on R LE for balance pt had 1 LOB where max correction from PT necessary to prevent fall. Pt reports he felt like his hip " buckled" when this hapended  causing his LOB.   ?      ?      ?Treatment Provided this session  ? ?Pt educated throughout session about proper posture and technique with exercises. Improved exercise technique, movement at target joints, use of target muscles after min to mod verbal, visual, tactile cues. ? ?Note: Portions of this document were prepared using Dragon voice recognition software and although reviewed may contain unintentional dictation errors in syntax, grammar, or spelling. ? ? ? ? ? ? ? ? ? ? ? ? ? ? ? ? ? ? ? ? ? ? ? ? ? ? ?  PT Education - 08/09/21 1116   ? ? Education provided Yes   ? Education Details exercise form andtechnique   ? Person(s) Educated Patient   ? Methods Explanation   ? Comprehension Verbalized understanding   ? ?  ?  ? ?  ? ? ? PT Short Term Goals - 07/12/21 1107   ? ?  ? PT SHORT TERM GOAL #1  ? Title Pt to demonstrate improved sustained AMB tolerance >837f.   ? Baseline At evlauation: 6066ftolerance (not to failure); 9/26: 137681t   ? Time 4   ? Period Weeks   ? Status Achieved   ? Target Date 01/05/21   ?  ? PT SHORT TERM GOAL #2  ? Title Pt will improve BERG balance score to greater than 45 to indicate decrease risk of falls.   ? Baseline 2/13:44   ? Time 4   ? Period Weeks   ? Status Achieved   ? Target Date 06/28/21   ?  ? PT SHORT TERM GOAL #3  ? Title Pt to demonstrate 10MWT<13sec to show self-selected gait speed appropriate for AMB outside of house and to improve energy efficiency.   ? Baseline UpWalker Eval: 17.53sec; 9/26: 0.74 m/s (13.5)11/21: 13.68 (.73 m/s) 12.64 on 12/28 9.88 pn2/13   ? Time 4   ? Period Weeks   ? Status Achieved   ? Target Date 02/08/21   ? ?  ?  ? ?  ? ? ? ? PT Long Term Goals - 07/12/21 1125   ? ?  ? PT LONG TERM GOAL #1  ? Title Pt will improve BERG balance score to greater than 47 to indicate decreaed risk of falls.   ? Baseline 41 on 12/28 44 on 2/13 45 on 3/27   ? Time 8   ? Period Weeks   ? Status On-going   ? Target Date 07/26/21   ?  ? PT LONG TERM GOAL #2  ? Title  Pt to tolerate 100038fustained AMB with UpWalker without rest interval to improve safety and independence in limited community AMB.   ? Baseline At eval; tired after 600f31f/26: 1384 ft   ? Time 8   ? Period Weeks

## 2021-08-11 ENCOUNTER — Ambulatory Visit: Payer: Medicare PPO | Admitting: Physical Therapy

## 2021-08-11 ENCOUNTER — Ambulatory Visit: Payer: Medicare PPO | Admitting: Occupational Therapy

## 2021-08-11 DIAGNOSIS — R2681 Unsteadiness on feet: Secondary | ICD-10-CM

## 2021-08-11 DIAGNOSIS — R262 Difficulty in walking, not elsewhere classified: Secondary | ICD-10-CM | POA: Diagnosis not present

## 2021-08-11 DIAGNOSIS — R296 Repeated falls: Secondary | ICD-10-CM | POA: Diagnosis not present

## 2021-08-11 DIAGNOSIS — R269 Unspecified abnormalities of gait and mobility: Secondary | ICD-10-CM | POA: Diagnosis not present

## 2021-08-11 DIAGNOSIS — G20 Parkinson's disease: Secondary | ICD-10-CM | POA: Diagnosis not present

## 2021-08-11 DIAGNOSIS — R278 Other lack of coordination: Secondary | ICD-10-CM | POA: Diagnosis not present

## 2021-08-11 DIAGNOSIS — M6281 Muscle weakness (generalized): Secondary | ICD-10-CM | POA: Diagnosis not present

## 2021-08-11 NOTE — Therapy (Signed)
[place] ?[place], [place], [address] ?Phone: [phone]   Fax:  [phone] ? ?Physical Therapy Treatment ? ?Patient Details  ?Name: Antwane Grose Weisenburger ?MRN: [id number] ?Date of Birth: July 03, 1950 ?Referring Provider (PT): Elsie Stain, MD ? ? ?Encounter Date: 08/11/2021 ? ? PT End of Session - 08/11/21 1112   ? ? Visit Number 55   ? Number of Visits 62   ? Date for PT Re-Evaluation 08/23/21   ? Authorization Type [employer] Medicare Choice PPO   ? Progress Note Due on Visit 60   ? PT Start Time 1103   ? PT Stop Time 1144   ? PT Time Calculation (min) 41 min   ? Equipment Utilized During Treatment Gait belt   ? Activity Tolerance Patient tolerated treatment well   ? Behavior During Therapy [facility] for tasks assessed/performed   ? ?  ?  ? ?  ? ? ?Past Medical History:  ?Diagnosis Date  ? Arthritis   ? Bradycardia   ? Cancer [facility]) 2013  ? skin cancer  ? Depression   ? ptsd  ? Dysrhythmia   ? chronic slow heart rate  ? GERD (gastroesophageal reflux disease)   ? Headache(784.0)   ? tension headaches non recent  ? History of chicken pox   ? History of kidney stones   ? passed  ? Hypertension   ? treated with HCTZ  ? Pacemaker   ? Parkinson's disease ([place])   ? dx'ed 15 years ago  ? PTSD (post-traumatic stress disorder)   ? Shortness of breath dyspnea   ? Sleep apnea   ? doesn't use C-pap  ? Varicose veins   ? ? ?Past Surgical History:  ?Procedure Laterality Date  ? CHOLECYSTECTOMY N/A 10/22/2014  ? Procedure: LAPAROSCOPIC CHOLECYSTECTOMY WITH INTRAOPERATIVE CHOLANGIOGRAM;  Surgeon: Dia Crawford III, MD;  Location: [facility] ORS;  Service: General;  Laterality: N/A;  ? cyst removed     ? from lip as a child  ? INTRAMEDULLARY (IM) NAIL INTERTROCHANTERIC N/A 02/18/2019  ? Procedure: INTRAMEDULLARY (IM) NAIL INTERTROCHANTRIC, RIGHT,;  Surgeon: Thornton Park, MD;  Location: [facility] ORS;  Service: Orthopedics;  Laterality: N/A;  ? LUMBAR LAMINECTOMY/DECOMPRESSION  MICRODISCECTOMY Bilateral 12/14/2012  ? Procedure: Bilateral lumbar three-four, four-five decompressive laminotomy/foraminotomy;  Surgeon: Charlie Pitter, MD;  Location: [place] [facility];  Service: Neurosurgery;  Laterality: Bilateral;  ? PULSE GENERATOR IMPLANT Bilateral 12/13/2013  ? Procedure: Bilateral implantable pulse generator placement;  Surgeon: Erline Levine, MD;  Location: [place] [facility] ORS;  Service: Neurosurgery;  Laterality: Bilateral;  Bilateral implantable pulse generator placement  ? skin cancer removed    ? from ears,   12 lft arm  rt leg 15  ? SUBTHALAMIC STIMULATOR BATTERY REPLACEMENT Bilateral 07/14/2017  ? Procedure: BILATERAL IMPLANTED PULSE GENERATOR CHANGE FOR DEEP BRAIN STIMULATOR;  Surgeon: Erline Levine, MD;  Location: [place];  Service: Neurosurgery;  Laterality: Bilateral;  ? SUBTHALAMIC STIMULATOR INSERTION Bilateral 12/06/2013  ? Procedure: SUBTHALAMIC STIMULATOR INSERTION;  Surgeon: Erline Levine, MD;  Location: [place] NEURO ORS;  Service: Neurosurgery;  Laterality: Bilateral;  Bilateral deep brain stimulator placement  ? ? ?There were no vitals filed for this visit. ? ? Subjective Assessment - 08/11/21 1108   ? ? Subjective Pt reports he had a fall this morning when he was getting up form the toilet. Upon investigation nad asking his caregiver she reports no fall occurred but pt slipped and " fell" to toilet when getting up fro mit  this AM secondary to his pajamas sliding under his foot. Pt did not fall to floor and no injury occurred.   ? Patient is accompained by: Family member   ? Pertinent History Loraine Freid is a 51yoM who comes to [facility] OPPT neuro for evaluation of conitnued difficulty of imbalance, unsteadiness on feet, and generally limited tolerance to mobility in the setting of chronic parkinsonism. SInce DC in February, pt has had PPM placement due to bradycardia issues. Pt also started using an UpWalker device 2 week prior to this evaluation, goal was to help with gradual melting of postural  extension that occured with sustained upright actiivty. Pt has continued to practice supervised AMB with caregivers, short distances out of house and in community. Pt continues to experience frequent falls on a weekly basis, at a rate that both he and wife report to be increasing in frequency. PMH: HTN, bradycardia s/p PPM, PD, DBS, COVID-19 infection, chronic low back pain s/p L4/5 surgery.   ? Limitations Walking;Standing;Sitting;House hold activities;Lifting   ? How long can you sit comfortably? Not limited   ? How long can you stand comfortably? 5 minutes with support   ? How long can you walk comfortably? ~5-6 minutes [name]   ? Patient Stated Goals Reduce falls, improve AMB tolerance   ? Pain Onset More than a month ago   ? ?  ?  ? ?  ? ? ? ? ?Exercise/Activity Sets/Reps/Time/ Resistance Assistance Charge type Comments- Unless otherwise stated, CGA was provided and gait belt donned in order to ensure pt safety ?  ?Leg press     Attempted 1 rep at 100# and pt reports discomfort in his back with this activity.   ?1 LE on airex, Other LE on step plus dyna disc  2 x 45 sec  CGA  NMR Cues for correct foot placement, some pain noted in R hip but pt notes this is chronic but seems to be limiting him in completing this   ?Sitting on dynadisc PWR! [place] ( PT with hands for external cue for reaching) X 10 ea side  CGA NMR  Cues for increaseing weight shift, utilized contralateral UE   ?LAQ - sitting on dyna disc to improve postural muscle activation  2 * 10 x 5 sec x 7.5 #   therex Cues for postural adjustments   ?Marching- sitting on dyna disc  2 x 10 with 7.5# AW   NMR Good postural muscle activation   ?      ?      ?      ?PRW! Step to airex pad (external cue for step length)  1 x 10   CGA  therex  Continued difficulty with L LE foot clearance ( and R LE stability) carry over form previous session.   ?      ?      ?Treatment Provided this session  ? ?Pt educated throughout session about proper posture and  technique with exercises. Improved exercise technique, movement at target joints, use of target muscles after min to mod verbal, visual, tactile cues. ? ?Note: Portions of this document were prepared using Dragon voice recognition software and although reviewed may contain unintentional dictation errors in syntax, grammar, or spelling. ? ? ? ? ? ? ? ? ? ? ? ? ? ? ? ? ? ? ? ? ? ? ? ? ? ? PT Education - 08/11/21 1112   ? ? Education provided Yes   ? Education Details  safety with everyday tasks   ? Person(s) Educated Patient   ? Methods Explanation   ? Comprehension Verbalized understanding   ? ?  ?  ? ?  ? ? ? PT Short Term Goals - 07/12/21 1107   ? ?  ? PT SHORT TERM GOAL #1  ? Title Pt to demonstrate improved sustained AMB tolerance >877f.   ? Baseline At evlauation: 6071ftolerance (not to failure); 9/26: 137048t   ? Time 4   ? Period Weeks   ? Status Achieved   ? Target Date 01/05/21   ?  ? PT SHORT TERM GOAL #2  ? Title Pt will improve BERG balance score to greater than 45 to indicate decrease risk of falls.   ? Baseline 2/13:44   ? Time 4   ? Period Weeks   ? Status Achieved   ? Target Date 06/28/21   ?  ? PT SHORT TERM GOAL #3  ? Title Pt to demonstrate 10MWT<13sec to show self-selected gait speed appropriate for AMB outside of house and to improve energy efficiency.   ? Baseline UpWalker Eval: 17.53sec; 9/26: 0.74 m/s (13.5)11/21: 13.68 (.73 m/s) 12.64 on 12/28 9.88 pn2/13   ? Time 4   ? Period Weeks   ? Status Achieved   ? Target Date 02/08/21   ? ?  ?  ? ?  ? ? ? ? PT Long Term Goals - 07/12/21 1125   ? ?  ? PT LONG TERM GOAL #1  ? Title Pt will improve BERG balance score to greater than 47 to indicate decreaed risk of falls.   ? Baseline 41 on 12/28 44 on 2/13 45 on 3/27   ? Time 8   ? Period Weeks   ? Status On-going   ? Target Date 07/26/21   ?  ? PT LONG TERM GOAL #2  ? Title Pt to tolerate 100040fustained AMB with UpWalker without rest interval to improve safety and independence in limited community  AMB.   ? Baseline At eval; tired after 600f56f/26: 1384 ft   ? Time 8   ? Period Weeks   ? Status Achieved   ? Target Date 05/03/21   ?  ? PT LONG TERM GOAL #3  ? Title Pt to demonstrate improved FOTO score by 5 points

## 2021-08-12 ENCOUNTER — Encounter: Payer: Self-pay | Admitting: Family Medicine

## 2021-08-12 ENCOUNTER — Telehealth (INDEPENDENT_AMBULATORY_CARE_PROVIDER_SITE_OTHER): Payer: Medicare PPO | Admitting: Family Medicine

## 2021-08-12 DIAGNOSIS — F4310 Post-traumatic stress disorder, unspecified: Secondary | ICD-10-CM | POA: Diagnosis not present

## 2021-08-12 DIAGNOSIS — J3489 Other specified disorders of nose and nasal sinuses: Secondary | ICD-10-CM | POA: Diagnosis not present

## 2021-08-12 MED ORDER — ESCITALOPRAM OXALATE 10 MG PO TABS: 20.0000 mg | ORAL_TABLET | Freq: Every day | ORAL | Status: DC

## 2021-08-12 MED ORDER — ESCITALOPRAM OXALATE 20 MG PO TABS: 20.0000 mg | ORAL_TABLET | Freq: Every day | ORAL | 1 refills | Status: DC

## 2021-08-12 MED ORDER — RYTARY 48.75-195 MG PO CPCR: 2.0000 | ORAL_CAPSULE | Freq: Three times a day (TID) | ORAL | Status: AC

## 2021-08-12 MED ORDER — FLUTICASONE PROPIONATE 50 MCG/ACT NA SUSP: 2.0000 | Freq: Every day | NASAL | 1 refills | Status: DC

## 2021-08-12 NOTE — Progress Notes (Signed)
Virtual visit completed through WebEx or similar program ?Patient location: home  ?Provider location: Financial controller at [facility], office  ?Participants: Patient and me (unless stated otherwise below) ? ?Pandemic considerations d/w pt.  ? ?Limitations and rationale for visit method d/w patient.  Patient agreed to proceed.  ? ?CC: follow up.   ? ?HPI: we talked about PTSD/depression.  Patient/family is awaiting a note from counselor about his condition.  He has [facility] hearing pending.  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  Currently treated with lexapro.  Wife is still caring for patient at home.   ? ?He has been dealing with the stress of the VA process along with a death in the family.  Having nightmares, more recently.  More irritability and anxiety recently.  No SI/HI.   ? ?He is still in PT at baseline.   ? ?More seasonal allergies.  D/w pt about restarting flonase.  He can tolerate flonase.  D/w pt.   ? ?Meds and allergies reviewed.  ? ?ROS: Per HPI unless specifically indicated in ROS section  ? ?NAD ?Speech wnl ? ?A/P: ? ?PTSD followed by counselor.  Patient is awaiting a note from counselor regarding his previous diagnosis.  He has VA hearing pending. 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  Currently treated with lexapro.  Wife is still caring for patient at home.  I think it makes sense to continue Lexapro as is. ? ?More seasonal allergies.  D/w pt about restarting flonase.  He can tolerate flonase.  D/w pt.   ?

## 2021-08-15 DIAGNOSIS — G20 Parkinson's disease: Secondary | ICD-10-CM

## 2021-08-15 DIAGNOSIS — F0280 Dementia in other diseases classified elsewhere without behavioral disturbance: Secondary | ICD-10-CM

## 2021-08-15 DIAGNOSIS — E785 Hyperlipidemia, unspecified: Secondary | ICD-10-CM | POA: Diagnosis not present

## 2021-08-15 DIAGNOSIS — F32A Depression, unspecified: Secondary | ICD-10-CM

## 2021-08-15 NOTE — Assessment & Plan Note (Signed)
PTSD followed by counselor.  Patient is awaiting a note from counselor regarding his previous diagnosis.  He has VA hearing pending. 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  Currently treated with lexapro.  Wife is still caring for patient at home.  I think it makes sense to continue Lexapro as is. ?

## 2021-08-15 NOTE — Assessment & Plan Note (Signed)
? ?  More seasonal allergies.  D/w pt about restarting flonase.  He can tolerate flonase.  D/w pt.   he can update me as needed. ?

## 2021-08-16 ENCOUNTER — Encounter: Payer: Self-pay | Admitting: Physical Therapy

## 2021-08-16 ENCOUNTER — Ambulatory Visit: Payer: Medicare PPO | Attending: Family Medicine | Admitting: Physical Therapy

## 2021-08-16 ENCOUNTER — Ambulatory Visit: Payer: Medicare PPO | Admitting: Occupational Therapy

## 2021-08-16 DIAGNOSIS — R296 Repeated falls: Secondary | ICD-10-CM | POA: Diagnosis not present

## 2021-08-16 DIAGNOSIS — R262 Difficulty in walking, not elsewhere classified: Secondary | ICD-10-CM | POA: Insufficient documentation

## 2021-08-16 DIAGNOSIS — R269 Unspecified abnormalities of gait and mobility: Secondary | ICD-10-CM | POA: Diagnosis not present

## 2021-08-16 DIAGNOSIS — R2681 Unsteadiness on feet: Secondary | ICD-10-CM | POA: Diagnosis not present

## 2021-08-16 NOTE — Therapy (Signed)
[facility] ?[place], [place], [address] ?Phone: [phone]   Fax:  [phone] ? ?Physical Therapy Treatment ? ?Patient Details  ?Name: [name] ?MRN: [id number] ?Date of Birth: 1951/02/18 ?Referring Provider (PT): Elsie Stain, MD ? ? ?Encounter Date: 08/16/2021 ? ? PT End of Session - 08/16/21 1055   ? ? Visit Number 58   ? Number of Visits 62   ? Date for PT Re-Evaluation 08/23/21   ? Authorization Type [employer] Medicare Choice PPO   ? Progress Note Due on Visit 60   ? PT Start Time 1102   ? PT Stop Time 1443   ? PT Time Calculation (min) 41 min   ? Equipment Utilized During Treatment Gait belt   ? Activity Tolerance Patient tolerated treatment well   ? Behavior During Therapy [facility] for tasks assessed/performed   ? ?  ?  ? ?  ? ? ?Past Medical History:  ?Diagnosis Date  ? Arthritis   ? Bradycardia   ? Cancer [facility]) 2013  ? skin cancer  ? Depression   ? ptsd  ? Dysrhythmia   ? chronic slow heart rate  ? GERD (gastroesophageal reflux disease)   ? Headache(784.0)   ? tension headaches non recent  ? History of chicken pox   ? History of kidney stones   ? passed  ? Hypertension   ? treated with HCTZ  ? Pacemaker   ? Parkinson's disease ([place])   ? dx'ed 15 years ago  ? PTSD (post-traumatic stress disorder)   ? Shortness of breath dyspnea   ? Sleep apnea   ? doesn't use C-pap  ? Varicose veins   ? ? ?Past Surgical History:  ?Procedure Laterality Date  ? CHOLECYSTECTOMY N/A 10/22/2014  ? Procedure: LAPAROSCOPIC CHOLECYSTECTOMY WITH INTRAOPERATIVE CHOLANGIOGRAM;  Surgeon: Dia Crawford III, MD;  Location: [facility] ORS;  Service: General;  Laterality: N/A;  ? cyst removed     ? from lip as a child  ? INTRAMEDULLARY (IM) NAIL INTERTROCHANTERIC N/A 02/18/2019  ? Procedure: INTRAMEDULLARY (IM) NAIL INTERTROCHANTRIC, RIGHT,;  Surgeon: Thornton Park, MD;  Location: [facility] ORS;  Service: Orthopedics;  Laterality: N/A;  ? LUMBAR LAMINECTOMY/DECOMPRESSION MICRODISCECTOMY  Bilateral 12/14/2012  ? Procedure: Bilateral lumbar three-four, four-five decompressive laminotomy/foraminotomy;  Surgeon: Charlie Pitter, MD;  Location: [place] [facility] ORS;  Service: Neurosurgery;  Laterality: Bilateral;  ? PULSE GENERATOR IMPLANT Bilateral 12/13/2013  ? Procedure: Bilateral implantable pulse generator placement;  Surgeon: Erline Levine, MD;  Location: [place] NEURO ORS;  Service: Neurosurgery;  Laterality: Bilateral;  Bilateral implantable pulse generator placement  ? skin cancer removed    ? from ears,   12 lft arm  rt leg 15  ? SUBTHALAMIC STIMULATOR BATTERY REPLACEMENT Bilateral 07/14/2017  ? Procedure: BILATERAL IMPLANTED PULSE GENERATOR CHANGE FOR DEEP BRAIN STIMULATOR;  Surgeon: Erline Levine, MD;  Location: [place];  Service: Neurosurgery;  Laterality: Bilateral;  ? SUBTHALAMIC STIMULATOR INSERTION Bilateral 12/06/2013  ? Procedure: SUBTHALAMIC STIMULATOR INSERTION;  Surgeon: Erline Levine, MD;  Location: [place] NEURO ORS;  Service: Neurosurgery;  Laterality: Bilateral;  Bilateral deep brain stimulator placement  ? ? ?There were no vitals filed for this visit. ? ? Subjective Assessment - 08/16/21 1553   ? ? Subjective Pt reports no falls or lOb since last visit. No fall falls to floor in over a week. Pt reports less back pain at this time.   ? Patient is accompained by: Family member   ? Pertinent History  [name] is a 47yoM who comes to [facility] OPPT neuro for evaluation of conitnued difficulty of imbalance, unsteadiness on feet, and generally limited tolerance to mobility in the setting of chronic parkinsonism. SInce DC in February, pt has had PPM placement due to bradycardia issues. Pt also started using an UpWalker device 2 week prior to this evaluation, goal was to help with gradual melting of postural extension that occured with sustained upright actiivty. Pt has continued to practice supervised AMB with caregivers, short distances out of house and in community. Pt continues to experience frequent falls on a  weekly basis, at a rate that both he and wife report to be increasing in frequency. PMH: HTN, bradycardia s/p PPM, PD, DBS, COVID-19 infection, chronic low back pain s/p L4/5 surgery.   ? Limitations Walking;Standing;Sitting;House hold activities;Lifting   ? How long can you sit comfortably? Not limited   ? How long can you stand comfortably? 5 minutes with support   ? How long can you walk comfortably? ~5-6 minutes [name]   ? Patient Stated Goals Reduce falls, improve AMB tolerance   ? Currently in Pain? Yes   ? Pain Score 3    ? Pain Location Back   ? Pain Onset More than a month ago   ? ?  ?  ? ?  ? ? ? ? ?Exercise/Activity Sets/Reps/Time/ Resistance Assistance Charge type Comments- Unless otherwise stated, CGA was provided and gait belt donned in order to ensure pt safety ?  ?Leg Press  15*100# ?12*115  Therex   RPE   ?1 LE on airex, Other LE on step plus dyna disc  2 x 45 sec  CGA  NMR Cues for correct foot placement, some pain noted in R hip but pt notes this is chronic but seems to be limiting him in completing this   ?Sitting on dynadisc [employer] [place] ( PT with hands for external cue for reaching) X 10 ea side  CGA NMR  Cues for increaseing weight shift, utilized contralateral UE   ?Seated hurdle step over sitting on Dyna disc  - sitting on dyna disc to improve postural muscle activation  2 * 10 x 5 sec x  #   therex Cues for postural adjustments   ?Marching- sitting on dyna disc  2 x 10 with 7.5# AW   NMR Good postural muscle activation   ?      ?      ?      ?PRW! Step to airex pad (external cue for step length)  1 x 10   CGA  therex  Continued difficulty with L LE foot clearance ( and R LE stability) carry over form previous session. Removed hurdle secondary to near fall and Mod/Max A for prevention  ?      ?      ?Treatment Provided this session  ? ?Pt educated throughout session about proper posture and technique with exercises. Improved exercise technique, movement at target joints, use of target  muscles after min to mod verbal, visual, tactile cues. ? ?Note: Portions of this document were prepared using Dragon voice recognition software and although reviewed may contain unintentional dictation errors in syntax, grammar, or spelling. ? ? ? ? ? ? ? ? ? ? ? ? ? ? ? ? ? ? ? ? ? ? ? ? ? ? ? ? ? PT Education - 08/16/21 1554   ? ? Education provided Yes   ? Education Details importance of safety with  difficulty tasks.   ? Person(s) Educated Patient   ? Methods Explanation;Demonstration   ? Comprehension Verbalized understanding;Returned demonstration   ? ?  ?  ? ?  ? ? ? PT Short Term Goals - 07/12/21 1107   ? ?  ? PT SHORT TERM GOAL #1  ? Title Pt to demonstrate improved sustained AMB tolerance >823f.   ? Baseline At evlauation: 6084ftolerance (not to failure); 9/26: [id number]   ? Time 4   ? Period Weeks   ? Status Achieved   ? Target Date 01/05/21   ?  ? PT SHORT TERM GOAL #2  ? Title Pt will improve BERG balance score to greater than 45 to indicate decrease risk of falls.   ? Baseline 2/13:44   ? Time 4   ? Period Weeks   ? Status Achieved   ? Target Date 06/28/21   ?  ? PT SHORT TERM GOAL #3  ? Title Pt to demonstrate 10MWT<13sec to show self-selected gait speed appropriate for AMB outside of house and to improve energy efficiency.   ? Baseline UpWalker Eval: 17.53sec; 9/26: 0.74 m/s (13.5)11/21: 13.68 (.73 m/s) 12.64 on 12/28 9.88 pn2/13   ? Time 4   ? Period Weeks   ? Status Achieved   ? Target Date 02/08/21   ? ?  ?  ? ?  ? ? ? ? PT Long Term Goals - 07/12/21 1125   ? ?  ? PT LONG TERM GOAL #1  ? Title Pt will improve BERG balance score to greater than 47 to indicate decreaed risk of falls.   ? Baseline 41 on 12/28 44 on 2/13 45 on 3/27   ? Time 8   ? Period Weeks   ? Status On-going   ? Target Date 07/26/21   ?  ? PT LONG TERM GOAL #2  ? Title Pt to tolerate 100042fustained AMB with UpWalker without rest interval to improve safety and independence in limited community AMB.   ? Baseline At eval; tired  after 600f89f/26: 1384 ft   ? Time 8   ? Period Weeks   ? Status Achieved   ? Target Date 05/03/21   ?  ? PT LONG TERM GOAL #3  ? Title Pt to demonstrate improved FOTO score by 5 points compared to init

## 2021-08-18 ENCOUNTER — Encounter: Payer: Self-pay | Admitting: Physical Therapy

## 2021-08-18 ENCOUNTER — Ambulatory Visit: Payer: Medicare PPO | Admitting: Physical Therapy

## 2021-08-18 DIAGNOSIS — R2681 Unsteadiness on feet: Secondary | ICD-10-CM | POA: Diagnosis not present

## 2021-08-18 DIAGNOSIS — R296 Repeated falls: Secondary | ICD-10-CM

## 2021-08-18 DIAGNOSIS — R262 Difficulty in walking, not elsewhere classified: Secondary | ICD-10-CM | POA: Diagnosis not present

## 2021-08-18 DIAGNOSIS — R269 Unspecified abnormalities of gait and mobility: Secondary | ICD-10-CM | POA: Diagnosis not present

## 2021-08-18 NOTE — Therapy (Signed)
[facility] ?[place], [place], [address] ?Phone: [phone]   Fax:  [phone] ? ?Physical Therapy Treatment ? ?Patient Details  ?Name: [name] ?MRN: [id number] ?Date of Birth: 1951/03/25 ?Referring Provider (PT): Elsie Stain, MD ? ? ?Encounter Date: 08/18/2021 ? ? PT End of Session - 08/18/21 1414   ? ? Visit Number 2   ? Number of Visits 62   ? Date for PT Re-Evaluation 08/23/21   ? Authorization Type [employer] Medicare Choice PPO   ? Progress Note Due on Visit 60   ? Equipment Utilized During Treatment Gait belt   ? Activity Tolerance Patient tolerated treatment well   ? Behavior During Therapy [facility] for tasks assessed/performed   ? ?  ?  ? ?  ? ? ?Past Medical History:  ?Diagnosis Date  ? Arthritis   ? Bradycardia   ? Cancer [facility]) 2013  ? skin cancer  ? Depression   ? ptsd  ? Dysrhythmia   ? chronic slow heart rate  ? GERD (gastroesophageal reflux disease)   ? Headache(784.0)   ? tension headaches non recent  ? History of chicken pox   ? History of kidney stones   ? passed  ? Hypertension   ? treated with HCTZ  ? Pacemaker   ? Parkinson's disease ([place])   ? dx'ed 15 years ago  ? PTSD (post-traumatic stress disorder)   ? Shortness of breath dyspnea   ? Sleep apnea   ? doesn't use C-pap  ? Varicose veins   ? ? ?Past Surgical History:  ?Procedure Laterality Date  ? CHOLECYSTECTOMY N/A 10/22/2014  ? Procedure: LAPAROSCOPIC CHOLECYSTECTOMY WITH INTRAOPERATIVE CHOLANGIOGRAM;  Surgeon: Dia Crawford III, MD;  Location: [facility] ORS;  Service: General;  Laterality: N/A;  ? cyst removed     ? from lip as a child  ? INTRAMEDULLARY (IM) NAIL INTERTROCHANTERIC N/A 02/18/2019  ? Procedure: INTRAMEDULLARY (IM) NAIL INTERTROCHANTRIC, RIGHT,;  Surgeon: Thornton Park, MD;  Location: [facility] ORS;  Service: Orthopedics;  Laterality: N/A;  ? LUMBAR LAMINECTOMY/DECOMPRESSION MICRODISCECTOMY Bilateral 12/14/2012  ? Procedure: Bilateral lumbar three-four, four-five  decompressive laminotomy/foraminotomy;  Surgeon: Charlie Pitter, MD;  Location: [place] NEURO ORS;  Service: Neurosurgery;  Laterality: Bilateral;  ? PULSE GENERATOR IMPLANT Bilateral 12/13/2013  ? Procedure: Bilateral implantable pulse generator placement;  Surgeon: Erline Levine, MD;  Location: [place] NEURO ORS;  Service: Neurosurgery;  Laterality: Bilateral;  Bilateral implantable pulse generator placement  ? skin cancer removed    ? from ears,   12 lft arm  rt leg 15  ? SUBTHALAMIC STIMULATOR BATTERY REPLACEMENT Bilateral 07/14/2017  ? Procedure: BILATERAL IMPLANTED PULSE GENERATOR CHANGE FOR DEEP BRAIN STIMULATOR;  Surgeon: Erline Levine, MD;  Location: [place];  Service: Neurosurgery;  Laterality: Bilateral;  ? SUBTHALAMIC STIMULATOR INSERTION Bilateral 12/06/2013  ? Procedure: SUBTHALAMIC STIMULATOR INSERTION;  Surgeon: Erline Levine, MD;  Location: [place] NEURO ORS;  Service: Neurosurgery;  Laterality: Bilateral;  Bilateral deep brain stimulator placement  ? ? ?There were no vitals filed for this visit. ? ? Subjective Assessment - 08/18/21 1412   ? ? Subjective Pt reports no falls or LOB since last session. Minimal low back pain at this time.   ? Patient is accompained by: Family member   ? Pertinent History [name] is a 53yoM who comes to [facility] OPPT neuro for evaluation of conitnued difficulty of imbalance, unsteadiness on feet, and generally limited tolerance to mobility in the setting of chronic parkinsonism.  SInce DC in February, pt has had PPM placement due to bradycardia issues. Pt also started using an UpWalker device 2 week prior to this evaluation, goal was to help with gradual melting of postural extension that occured with sustained upright actiivty. Pt has continued to practice supervised AMB with caregivers, short distances out of house and in community. Pt continues to experience frequent falls on a weekly basis, at a rate that both he and wife report to be increasing in frequency. PMH: HTN, bradycardia s/p PPM,  PD, DBS, COVID-19 infection, chronic low back pain s/p L4/5 surgery.   ? Limitations Walking;Standing;Sitting;House hold activities;Lifting   ? How long can you sit comfortably? Not limited   ? How long can you stand comfortably? 5 minutes with support   ? How long can you walk comfortably? ~5-6 minutes [name]   ? Patient Stated Goals Reduce falls, improve AMB tolerance   ? Currently in Pain? Yes   ? Pain Score 3    ? Pain Location Back   ? Pain Orientation Lower   ? Pain Descriptors / Indicators Aching   ? Pain Type Chronic pain   ? Pain Onset More than a month ago   ? Pain Onset More than a month ago   ? ?  ?  ? ?  ? ? ?Exercise/Activity Sets/Reps/Time/ Resistance Assistance Charge type Comments- Unless otherwise stated, CGA was provided and gait belt donned in order to ensure pt safety ?  ?Leg Press  15*100# ?12*115# ?8 * 130#  Therex   5-6RPE  ?Increaseds resistance this session   ?1 LE on airex, Other LE on step plus dyna disc  2 x 45 sec  CGA  NMR Cues for correct foot placement, some pain noted in R hip but pt notes this is chronic but seems to be limiting him in completing this   ?Sitting on dynadisc [employer] [place] ( PT with hands for external cue for reaching) X 10 ea side  CGA NMR  Cues for increaseing weight shift, utilized contralateral UE   ?Seated hurdle step over sitting on Dyna disc  - sitting on dyna disc to improve postural muscle activation  2 * 10 x 5 sec x  #   therex Cues for postural adjustments   ?Marching- sitting on dyna disc  2 x 10 with 7.5# AW   NMR Good postural muscle activation, no LOB noted or fatigue   ?[place] Balance  X 5 rounds   NMR  Weight shifting balance game to improve dynamic weight shifting activities. Improvement with practice noted   ?      ?      ?PRW! Step to airex pad (external cue for step length)  1 x 10   CGA  therex  Continued difficulty with L LE foot clearance ( and R LE stability) carry over form previous session. Removed hurdle secondary to near fall and  Mod/Max A for prevention  ?      ?      ?Treatment Provided this session  ? ?Pt educated throughout session about proper posture and technique with exercises. Improved exercise technique, movement at target joints, use of target muscles after min to mod verbal, visual, tactile cues. ? ?Note: Portions of this document were prepared using Dragon voice recognition software and although reviewed may contain unintentional dictation errors in syntax, grammar, or spelling. ? ? ? ? ? ? ? ? ? ? ? ? ? ? ? ? ? ? ? ? ? ? ? ? ? ? ?  PT Education - 08/18/21 1413   ? ? Education provided Yes   ? Education Details exercise technique   ? Person(s) Educated Patient   ? Methods Explanation   ? Comprehension Verbalized understanding   ? ?  ?  ? ?  ? ? ? PT Short Term Goals - 07/12/21 1107   ? ?  ? PT SHORT TERM GOAL #1  ? Title Pt to demonstrate improved sustained AMB tolerance >831f.   ? Baseline At evlauation: 6081ftolerance (not to failure); 9/26: 132620t   ? Time 4   ? Period Weeks   ? Status Achieved   ? Target Date 01/05/21   ?  ? PT SHORT TERM GOAL #2  ? Title Pt will improve BERG balance score to greater than 45 to indicate decrease risk of falls.   ? Baseline 2/13:44   ? Time 4   ? Period Weeks   ? Status Achieved   ? Target Date 06/28/21   ?  ? PT SHORT TERM GOAL #3  ? Title Pt to demonstrate 10MWT<13sec to show self-selected gait speed appropriate for AMB outside of house and to improve energy efficiency.   ? Baseline UpWalker Eval: 17.53sec; 9/26: 0.74 m/s (13.5)11/21: 13.68 (.73 m/s) 12.64 on 12/28 9.88 pn2/13   ? Time 4   ? Period Weeks   ? Status Achieved   ? Target Date 02/08/21   ? ?  ?  ? ?  ? ? ? ? PT Long Term Goals - 07/12/21 1125   ? ?  ? PT LONG TERM GOAL #1  ? Title Pt will improve BERG balance score to greater than 47 to indicate decreaed risk of falls.   ? Baseline 41 on 12/28 44 on 2/13 45 on 3/27   ? Time 8   ? Period Weeks   ? Status On-going   ? Target Date 07/26/21   ?  ? PT LONG TERM GOAL #2  ? Title Pt  to tolerate 100038fustained AMB with UpWalker without rest interval to improve safety and independence in limited community AMB.   ? Baseline At eval; tired after 600f49f/26: 1384 ft   ? Time 8   ? [place]

## 2021-08-23 ENCOUNTER — Ambulatory Visit: Payer: Medicare PPO | Admitting: Occupational Therapy

## 2021-08-23 ENCOUNTER — Ambulatory Visit: Payer: Medicare PPO | Admitting: Physical Therapy

## 2021-08-23 ENCOUNTER — Ambulatory Visit (INDEPENDENT_AMBULATORY_CARE_PROVIDER_SITE_OTHER): Payer: Medicare PPO

## 2021-08-23 DIAGNOSIS — R2681 Unsteadiness on feet: Secondary | ICD-10-CM

## 2021-08-23 DIAGNOSIS — R296 Repeated falls: Secondary | ICD-10-CM | POA: Diagnosis not present

## 2021-08-23 DIAGNOSIS — R269 Unspecified abnormalities of gait and mobility: Secondary | ICD-10-CM

## 2021-08-23 DIAGNOSIS — I495 Sick sinus syndrome: Secondary | ICD-10-CM | POA: Diagnosis not present

## 2021-08-23 DIAGNOSIS — R262 Difficulty in walking, not elsewhere classified: Secondary | ICD-10-CM | POA: Diagnosis not present

## 2021-08-23 NOTE — Therapy (Signed)
[place] [facility] ?[place], [place], [address] ?Phone: [phone]   Fax:  [phone] ? ?Physical Therapy Treatment/ Physical Therapy Progress Note/ RE-CERT ? ? ?Dates of reporting period  07/12/21   to   08/23/21 ? ? ?Patient Details  ?Name: [name] ?MRN: [id number] ?Date of Birth: 1950/06/25 ?Referring Provider (PT): George Stain, MD ? ? ?Encounter Date: 08/23/2021 ? ? PT End of Session - 08/23/21 1115   ? ? Visit Number 60   ? Number of Visits 62   ? Date for PT Re-Evaluation 08/23/21   ? Authorization Type [employer] Medicare Choice PPO   ? Progress Note Due on Visit 60   ? PT Start Time 1110   ? PT Stop Time 1145   ? PT Time Calculation (min) 35 min   ? Equipment Utilized During Treatment Gait belt   ? Activity Tolerance Patient tolerated treatment well   ? Behavior During Therapy [facility] for tasks assessed/performed   ? ?  ?  ? ?  ? ? ?Past Medical History:  ?Diagnosis Date  ? Arthritis   ? Bradycardia   ? Cancer [facility]) 2013  ? skin cancer  ? Depression   ? ptsd  ? Dysrhythmia   ? chronic slow heart rate  ? GERD (gastroesophageal reflux disease)   ? Headache(784.0)   ? tension headaches non recent  ? History of chicken pox   ? History of kidney stones   ? passed  ? Hypertension   ? treated with HCTZ  ? Pacemaker   ? Parkinson's disease ([place])   ? dx'ed 15 years ago  ? PTSD (post-traumatic stress disorder)   ? Shortness of breath dyspnea   ? Sleep apnea   ? doesn't use C-pap  ? Varicose veins   ? ? ?Past Surgical History:  ?Procedure Laterality Date  ? CHOLECYSTECTOMY N/A 10/22/2014  ? Procedure: LAPAROSCOPIC CHOLECYSTECTOMY WITH INTRAOPERATIVE CHOLANGIOGRAM;  Surgeon: Dia Crawford III, MD;  Location: [facility] ORS;  Service: General;  Laterality: N/A;  ? cyst removed     ? from lip as a child  ? INTRAMEDULLARY (IM) NAIL INTERTROCHANTERIC N/A 02/18/2019  ? Procedure: INTRAMEDULLARY (IM) NAIL INTERTROCHANTRIC, RIGHT,;  Surgeon: Thornton Park, MD;  Location: [facility]  ORS;  Service: Orthopedics;  Laterality: N/A;  ? LUMBAR LAMINECTOMY/DECOMPRESSION MICRODISCECTOMY Bilateral 12/14/2012  ? Procedure: Bilateral lumbar three-four, four-five decompressive laminotomy/foraminotomy;  Surgeon: Charlie Pitter, MD;  Location: [place] NEURO ORS;  Service: Neurosurgery;  Laterality: Bilateral;  ? PULSE GENERATOR IMPLANT Bilateral 12/13/2013  ? Procedure: Bilateral implantable pulse generator placement;  Surgeon: Erline Levine, MD;  Location: [place] NEURO ORS;  Service: Neurosurgery;  Laterality: Bilateral;  Bilateral implantable pulse generator placement  ? skin cancer removed    ? from ears,   12 lft arm  rt leg 15  ? SUBTHALAMIC STIMULATOR BATTERY REPLACEMENT Bilateral 07/14/2017  ? Procedure: BILATERAL IMPLANTED PULSE GENERATOR CHANGE FOR DEEP BRAIN STIMULATOR;  Surgeon: Erline Levine, MD;  Location: [place];  Service: Neurosurgery;  Laterality: Bilateral;  ? SUBTHALAMIC STIMULATOR INSERTION Bilateral 12/06/2013  ? Procedure: SUBTHALAMIC STIMULATOR INSERTION;  Surgeon: Erline Levine, MD;  Location: [place] NEURO ORS;  Service: Neurosurgery;  Laterality: Bilateral;  Bilateral deep brain stimulator placement  ? ? ?There were no vitals filed for this visit. ? ? Subjective Assessment - 08/23/21 1114   ? ? Subjective Pt reports no falls or LOB since last session. Pt reports having some back soreness following a busy weekend of  walking.   ? Patient is accompained by: Family member   ? Pertinent History [name] is a 47yoM who comes to [facility] OPPT neuro for evaluation of conitnued difficulty of imbalance, unsteadiness on feet, and generally limited tolerance to mobility in the setting of chronic parkinsonism. SInce DC in February, pt has had PPM placement due to bradycardia issues. Pt also started using an UpWalker device 2 week prior to this evaluation, goal was to help with gradual melting of postural extension that occured with sustained upright actiivty. Pt has continued to practice supervised AMB with caregivers,  short distances out of house and in community. Pt continues to experience frequent falls on a weekly basis, at a rate that both he and wife report to be increasing in frequency. PMH: HTN, bradycardia s/p PPM, PD, DBS, COVID-19 infection, chronic low back pain s/p L4/5 surgery.   ? Limitations Walking;Standing;Sitting;House hold activities;Lifting   ? How long can you sit comfortably? Not limited   ? How long can you stand comfortably? 5 minutes with support   ? How long can you walk comfortably? ~5-6 minutes [name]   ? Patient Stated Goals Reduce falls, improve AMB tolerance   ? Currently in Pain? Yes   ? Pain Score 5    ? Pain Location Back   ? Pain Orientation Lower   ? Pain Descriptors / Indicators Sore   ? Pain Type Chronic pain   ? Pain Onset More than a month ago   ? Pain Onset More than a month ago   ? ?  ?  ? ?  ? ? ? ? ? [facility] PT Assessment - 08/23/21 0001   ? ?  ? Berg Balance Test  ? Sit to Stand Able to stand without using hands and stabilize independently   ? Standing Unsupported Able to stand safely 2 minutes   ? Sitting with Back Unsupported but Feet Supported on Floor or Stool Able to sit safely and securely 2 minutes   ? Stand to Sit Sits safely with minimal use of hands   ? Transfers Able to transfer safely, definite need of hands   ? Standing Unsupported with Eyes Closed Able to stand 10 seconds safely   ? Standing Unsupported with Feet Together Able to place feet together independently but unable to hold for 30 seconds   ? From Standing, Reach Forward with Outstretched Arm Can reach confidently >25 cm (10")   ? From Standing Position, Pick up Object from Floor Able to pick up shoe, needs supervision   ? From Standing Position, Turn to Look Behind Over each Shoulder Looks behind from both sides and weight shifts well   ? Turn 360 Degrees Able to turn 360 degrees safely but slowly   ? Standing Unsupported, Alternately Place Feet on Step/Stool Able to complete 4 steps without aid or  supervision   hit step on rep 6 with R LE  ? Standing Unsupported, One Foot in Front Able to plae foot ahead of the other independently and hold 30 seconds   ? Standing on One Leg Able to lift leg independently and hold 5-10 seconds   ? Total Score 46   ? ?  ?  ? ?  ? ? ?6MWT: Patient completes with up walker.  Patient demonstrated some scissoring type gait but did improve with awareness to this.  Patient did have increased foot drag toward end of ambulatory bout but overall good balance throughout task.  Patient did have particularly good  mechanics without walker during turns staying within support system rather than as 2 separate units. ? ?Forward focus on therapeutic outcome survey to next session as patient's primary caregiver unable to be present with him this session. ? ? ?Physical therapy treatment session today consisted of completing assessment of goals and administration of testing as demonstrated in flow sheet and goals section of this exam. Addition treatments may be found below.  ? ? ? ? ? ? ? ? ? ? ? ? ? ? ? ? ? ? ? ? ? ? PT Education - 08/23/21 1214   ? ? Education provided Yes   ? Education Details Therapeutic progress   ? Person(s) Educated Patient   ? Methods Explanation   ? Comprehension Verbalized understanding   ? ?  ?  ? ?  ? ? ? PT Short Term Goals - 07/12/21 1107   ? ?  ? PT SHORT TERM GOAL #1  ? Title Pt to demonstrate improved sustained AMB tolerance >862f.   ? Baseline At evlauation: 6061ftolerance (not to failure); 9/26: 137564t   ? Time 4   ? Period Weeks   ? Status Achieved   ? Target Date 01/05/21   ?  ? PT SHORT TERM GOAL #2  ? Title Pt will improve BERG balance score to greater than 45 to indicate decrease risk of falls.   ? Baseline 2/13:44   ? Time 4   ? Period Weeks   ? Status Achieved   ? Target Date 06/28/21   ?  ? PT SHORT TERM GOAL #3  ? Title Pt to demonstrate 10MWT<13sec to show self-selected gait speed appropriate for AMB outside of house and to improve energy efficiency.    ? Baseline UpWalker Eval: 17.53sec; 9/26: 0.74 m/s (13.5)11/21: 13.68 (.73 m/s) 12.64 on 12/28 9.88 pn2/13   ? Time 4   ? Period Weeks   ? Status Achieved   ? Target Date 02/08/21   ? ?  ?  ? ?  ? ? ? ? P

## 2021-08-24 ENCOUNTER — Ambulatory Visit (INDEPENDENT_AMBULATORY_CARE_PROVIDER_SITE_OTHER): Payer: Medicare PPO | Admitting: Psychology

## 2021-08-24 DIAGNOSIS — F331 Major depressive disorder, recurrent, moderate: Secondary | ICD-10-CM | POA: Diagnosis not present

## 2021-08-24 DIAGNOSIS — F4323 Adjustment disorder with mixed anxiety and depressed mood: Secondary | ICD-10-CM | POA: Diagnosis not present

## 2021-08-24 LAB — CUP PACEART REMOTE DEVICE CHECK
Battery Remaining Longevity: 152 mo
Battery Voltage: 3.06 V
Brady Statistic AP VP Percent: 0.04 %
Brady Statistic AP VS Percent: 97.69 %
Brady Statistic AS VP Percent: 0 %
Brady Statistic AS VS Percent: 2.28 %
Brady Statistic RA Percent Paced: 97.69 %
Brady Statistic RV Percent Paced: 0.04 %
Date Time Interrogation Session: 20230508013209
Implantable Lead Implant Date: 20220506
Implantable Lead Implant Date: 20220506
Implantable Lead Location: 753859
Implantable Lead Location: 753860
Implantable Lead Model: 5076
Implantable Lead Model: 5076
Implantable Pulse Generator Implant Date: 20220506
Lead Channel Impedance Value: 361 Ohm
Lead Channel Impedance Value: 361 Ohm
Lead Channel Impedance Value: 418 Ohm
Lead Channel Impedance Value: 494 Ohm
Lead Channel Pacing Threshold Amplitude: 0.625 V
Lead Channel Pacing Threshold Amplitude: 0.75 V
Lead Channel Pacing Threshold Pulse Width: 0.4 ms
Lead Channel Pacing Threshold Pulse Width: 0.4 ms
Lead Channel Sensing Intrinsic Amplitude: 3.125 mV
Lead Channel Sensing Intrinsic Amplitude: 3.125 mV
Lead Channel Sensing Intrinsic Amplitude: 9.875 mV
Lead Channel Sensing Intrinsic Amplitude: 9.875 mV
Lead Channel Setting Pacing Amplitude: 1.5 V
Lead Channel Setting Pacing Amplitude: 2 V
Lead Channel Setting Pacing Pulse Width: 0.4 ms
Lead Channel Setting Sensing Sensitivity: 1.2 mV

## 2021-08-24 NOTE — Progress Notes (Signed)
?  Physician Documentation  ?Your signature is required to indicate approval of the treatment plan as stated above. By signing this report, you are approving the plan of care. Please sign and either send electronically or print and fax the signed copy to the number below. If you approve with modifications, please indicate those in the space provided._______________________________________________________________  ?Physician Signature: ____Graham Damita Dunnings ?Date:__05/09/23 Time:__8:24 AM  ?

## 2021-08-25 ENCOUNTER — Ambulatory Visit: Payer: Medicare PPO | Admitting: Physical Therapy

## 2021-08-25 ENCOUNTER — Encounter: Payer: Self-pay | Admitting: Physical Therapy

## 2021-08-25 DIAGNOSIS — R2681 Unsteadiness on feet: Secondary | ICD-10-CM

## 2021-08-25 DIAGNOSIS — R296 Repeated falls: Secondary | ICD-10-CM

## 2021-08-25 DIAGNOSIS — R262 Difficulty in walking, not elsewhere classified: Secondary | ICD-10-CM | POA: Diagnosis not present

## 2021-08-25 DIAGNOSIS — R269 Unspecified abnormalities of gait and mobility: Secondary | ICD-10-CM

## 2021-08-25 NOTE — Therapy (Signed)
[place] [facility] ?[place], [place], [address] ?Phone: [phone]   Fax:  [phone] ? ?Physical Therapy Treatment ? ?Patient Details  ?Name: [name] ?MRN: [id number] ?Date of Birth: August 05, 1950 ?Referring Provider (PT): Elsie Stain, MD ? ? ?Encounter Date: 08/25/2021 ? ? PT End of Session - 08/25/21 1052   ? ? Visit Number 71   ? Number of Visits 74   ? Date for PT Re-Evaluation 10/04/21   ? Authorization Type [employer] Medicare Choice PPO   ? Progress Note Due on Visit 60   ? PT Start Time 1055   ? PT Stop Time 1140   ? PT Time Calculation (min) 45 min   ? Equipment Utilized During Treatment Gait belt   ? Activity Tolerance Patient tolerated treatment well   ? Behavior During Therapy [facility] for tasks assessed/performed   ? ?  ?  ? ?  ? ? ?Past Medical History:  ?Diagnosis Date  ? Arthritis   ? Bradycardia   ? Cancer [facility]) 2013  ? skin cancer  ? Depression   ? ptsd  ? Dysrhythmia   ? chronic slow heart rate  ? GERD (gastroesophageal reflux disease)   ? Headache(784.0)   ? tension headaches non recent  ? History of chicken pox   ? History of kidney stones   ? passed  ? Hypertension   ? treated with HCTZ  ? Pacemaker   ? Parkinson's disease ([place])   ? dx'ed 15 years ago  ? PTSD (post-traumatic stress disorder)   ? Shortness of breath dyspnea   ? Sleep apnea   ? doesn't use C-pap  ? Varicose veins   ? ? ?Past Surgical History:  ?Procedure Laterality Date  ? CHOLECYSTECTOMY N/A 10/22/2014  ? Procedure: LAPAROSCOPIC CHOLECYSTECTOMY WITH INTRAOPERATIVE CHOLANGIOGRAM;  Surgeon: Dia Crawford III, MD;  Location: [facility] ORS;  Service: General;  Laterality: N/A;  ? cyst removed     ? from lip as a child  ? INTRAMEDULLARY (IM) NAIL INTERTROCHANTERIC N/A 02/18/2019  ? Procedure: INTRAMEDULLARY (IM) NAIL INTERTROCHANTRIC, RIGHT,;  Surgeon: Thornton Park, MD;  Location: [facility] ORS;  Service: Orthopedics;  Laterality: N/A;  ? LUMBAR LAMINECTOMY/DECOMPRESSION  MICRODISCECTOMY Bilateral 12/14/2012  ? Procedure: Bilateral lumbar three-four, four-five decompressive laminotomy/foraminotomy;  Surgeon: Charlie Pitter, MD;  Location: [place] NEURO ORS;  Service: Neurosurgery;  Laterality: Bilateral;  ? PULSE GENERATOR IMPLANT Bilateral 12/13/2013  ? Procedure: Bilateral implantable pulse generator placement;  Surgeon: Erline Levine, MD;  Location: [place] NEURO ORS;  Service: Neurosurgery;  Laterality: Bilateral;  Bilateral implantable pulse generator placement  ? skin cancer removed    ? from ears,   12 lft arm  rt leg 15  ? SUBTHALAMIC STIMULATOR BATTERY REPLACEMENT Bilateral 07/14/2017  ? Procedure: BILATERAL IMPLANTED PULSE GENERATOR CHANGE FOR DEEP BRAIN STIMULATOR;  Surgeon: Erline Levine, MD;  Location: [place];  Service: Neurosurgery;  Laterality: Bilateral;  ? SUBTHALAMIC STIMULATOR INSERTION Bilateral 12/06/2013  ? Procedure: SUBTHALAMIC STIMULATOR INSERTION;  Surgeon: Erline Levine, MD;  Location: [place] NEURO ORS;  Service: Neurosurgery;  Laterality: Bilateral;  Bilateral deep brain stimulator placement  ? ? ?There were no vitals filed for this visit. ? ? Subjective Assessment - 08/25/21 1051   ? ? Subjective Pt reports no falls since last session but caregiver had big concern regarding his cleaning of car reporting he was crossing his feet a lot which caused impairment of balance.   ? Patient is accompained by: Family member   ?  Pertinent History [name] is a 4yoM who comes to [facility] OPPT neuro for evaluation of conitnued difficulty of imbalance, unsteadiness on feet, and generally limited tolerance to mobility in the setting of chronic parkinsonism. SInce DC in February, pt has had PPM placement due to bradycardia issues. Pt also started using an UpWalker device 2 week prior to this evaluation, goal was to help with gradual melting of postural extension that occured with sustained upright actiivty. Pt has continued to practice supervised AMB with caregivers, short distances out of  house and in community. Pt continues to experience frequent falls on a weekly basis, at a rate that both he and wife report to be increasing in frequency. PMH: HTN, bradycardia s/p PPM, PD, DBS, COVID-19 infection, chronic low back pain s/p L4/5 surgery.   ? Limitations Walking;Standing;Sitting;House hold activities;Lifting   ? How long can you sit comfortably? Not limited   ? How long can you stand comfortably? 5 minutes with support   ? How long can you walk comfortably? ~5-6 minutes [name]   ? Patient Stated Goals Reduce falls, improve AMB tolerance   ? Pain Onset More than a month ago   ? Pain Onset More than a month ago   ? ?  ?  ? ?  ? ? ?Exercise/Activity Sets/Reps/Time/ Resistance Assistance Charge type Comments- Unless otherwise stated, CGA was provided and gait belt donned in order to ensure pt safety ?  ?Leg Press  15*100# ?12*115# ?8 * 130#  Therex   5-6RPE  ?Increaseds resistance this session   ?      ?      ?Seated hurdle step over sitting on Dyna disc  - sitting on dyna disc to improve postural muscle activation  2 * 10 x 5 sec x  #   therex Cues for postural adjustments   ?      ?Kore Balance - multiple games and activities including, pop dot for dynamic weight shifting, line tracing for weight shifting, and memory game for weight sifting with cognitive challenge X many rounds of each  NMR  Weight shifting balance game to improve dynamic weight shifting activities. Improvement with practice noted   ?      ?      ?PRW! Step to airex pad (external cue for step length)  1 x 10   CGA  therex  Continued difficulty with L LE foot clearance ( and R LE stability) carry over form previous session. Removed hurdle secondary to near fall and Mod/Max A for prevention  ?      ?      ?Treatment Provided this session  ? ?Pt educated throughout session about proper posture and technique with exercises. Improved exercise technique, movement at target joints, use of target muscles after min to mod verbal,  visual, tactile cues. ? ?Note: Portions of this document were prepared using Dragon voice recognition software and although reviewed may contain unintentional dictation errors in syntax, grammar, or spelling. ? ? ? ? ? ? ? ? ? ? ? ? ? ? ? ? ? ? ? ? ? ? ? ? ? ? ? ? ? ? PT Education - 08/25/21 1051   ? ? Education provided Yes   ? Education Details Exercise technique   ? Person(s) Educated Patient   ? Methods Explanation   ? Comprehension Verbalized understanding   ? ?  ?  ? ?  ? ? ? PT Short Term Goals - 07/12/21 1107   ? ?  ?  PT SHORT TERM GOAL #1  ? Title Pt to demonstrate improved sustained AMB tolerance >870f.   ? Baseline At evlauation: 6066ftolerance (not to failure); 9/26: 133903t   ? Time 4   ? Period Weeks   ? Status Achieved   ? Target Date 01/05/21   ?  ? PT SHORT TERM GOAL #2  ? Title Pt will improve BERG balance score to greater than 45 to indicate decrease risk of falls.   ? Baseline 2/13:44   ? Time 4   ? Period Weeks   ? Status Achieved   ? Target Date 06/28/21   ?  ? PT SHORT TERM GOAL #3  ? Title Pt to demonstrate 10MWT<13sec to show self-selected gait speed appropriate for AMB outside of house and to improve energy efficiency.   ? Baseline UpWalker Eval: 17.53sec; 9/26: 0.74 m/s (13.5)11/21: 13.68 (.73 m/s) 12.64 on 12/28 9.88 pn2/13   ? Time 4   ? Period Weeks   ? Status Achieved   ? Target Date 02/08/21   ? ?  ?  ? ?  ? ? ? ? PT Long Term Goals - 08/23/21 1131   ? ?  ? PT LONG TERM GOAL #1  ? Title Pt will improve BERG balance score to greater than 47 to indicate decreaed risk of falls.   ? Baseline 41 on 12/28 44 on 2/13 45 on 3/27 5/5: 46   ? Time 8   ? Period Weeks   ? Status On-going   ? Target Date 10/04/21   ?  ? PT LONG TERM GOAL #2  ? Title Pt to tolerate 100041fustained AMB with UpWalker without rest interval to improve safety and independence in limited community AMB.   ? Baseline At eval; tired after 600f59f/26: 1384 ft   ? Time 8   ? Period Weeks   ? Status Achieved   ? Target  Date 10/04/21   ?  ? PT LONG TERM GOAL #3  ? Title Pt to demonstrate improved FOTO score by 5 points compared to initital assessment.   ? Baseline 9/26: 49 (eval was 50) 911/21: 52 12/28 deferred as wife not present and pt n

## 2021-08-25 NOTE — Progress Notes (Signed)
[facility] Counselor/Therapist Progress Note ? ?Patient ID: [name], MRN: [id number],   ? ?Date: 08/24/2021 ? ?Time Spent: 45 minutes ? ?Treatment Type: Conjoint therapy with patient there ? ?Reported Symptoms: sadness, and boredom ? ?Mental Status Exam: ?Appearance:  Casual     ?Behavior: Drowsy  ?Motor: Psychomotor Retardation  ?Speech/Language:  Slow  ?Affect: Blunt  ?Mood: normal  ?Thought process: concrete  ?Thought content:   WNL  ?Sensory/Perceptual disturbances:   WNL  ?Orientation: oriented to person, place, time/date, and situation  ?Attention: Fair  ?Concentration: Fair  ?Memory: Immediate;   Fair  ?Fund of knowledge:  Fair  ?Insight:   Fair  ?Judgment:  Fair  ?Impulse Control: Fair  ? ?Risk Assessment: ?Danger to Self:  No ?Self-injurious Behavior: No ?Danger to Others: No ?Duty to Warn:no ?Physical Aggression / Violence:No  ?Access to Firearms a concern: No  ?Gang Involvement:No  ? ?Subjective: The patient attended a face-to-face conjoint therapy session via video visit today.  The patient gave verbal consent for the video to be on WebEx.  The patient was in his home with wife and therapist was in the office.  The patient presents with a flat affect and mood is pleasant.  The patient and wife were having did a disagreement today.  She sat in on the session and we talked about him being more considerate about her feelings in regard to him falling.  The patient has been not listening to what she is saying and she feels frustrated because she knows he is going to fall if he continues to do what he has been doing.  It is questionable as to whether he is very aware of anyone else's feelings but his.  We talked about the importance of him being more aware of her concerns and we also talked about his sadness related to his lack of independence.  They are having a break coming up this weekend as he will be going on a trip with the man from his church.  This will be good for the both of  them.  We will continue to process provide supportive therapy to both the patient and his wife as needed. ? Interventions: Ego-Supportive, CBT ? ?Diagnosis:Major depressive disorder, recurrent episode, moderate ([place]) ? ?Adjustment disorder with mixed anxiety and depressed mood ? ?Plan: Please see Treatment plan in Therapy Charts with target date of 04/30/2022 for goals and progress towards goals.  Plan is to continue to offer supportive therapy to patient to help him deal with his depression related to his Parkinsons diagnosis. ? ?Patient approved Treatment Plan in Therapy Charts. ? ?Juandedios Dudash G Kamron Vanwyhe, LCSW ? ? ? ? ? ? ? ? ? ? ? ? ? ? ? ? ? ?Beretta Ginsberg G Gracelyn Coventry, LCSW ? ? ? ? ? ? ? ? ? ? ? ? ? ? ?Micheal Murad G Gilmer Kaminsky, LCSW ? ? ? ? ? ? ? ? ? ? ? ? ? ? ?Nakshatra Klose G Eureka Valdes, LCSW ? ? ? ? ? ? ? ? ? ? ? ? ? ? ?Heyli Min G Venicia Vandall, LCSW ? ? ? ? ? ? ? ? ? ? ? ? ? ? ?Bion Todorov G Gracynn Rajewski, LCSW ?

## 2021-08-27 ENCOUNTER — Emergency Department (HOSPITAL_BASED_OUTPATIENT_CLINIC_OR_DEPARTMENT_OTHER): Payer: Medicare PPO | Admitting: Radiology

## 2021-08-27 ENCOUNTER — Encounter (HOSPITAL_BASED_OUTPATIENT_CLINIC_OR_DEPARTMENT_OTHER): Payer: Self-pay | Admitting: *Deleted

## 2021-08-27 ENCOUNTER — Other Ambulatory Visit: Payer: Self-pay

## 2021-08-27 ENCOUNTER — Telehealth: Payer: Self-pay | Admitting: *Deleted

## 2021-08-27 ENCOUNTER — Emergency Department (HOSPITAL_BASED_OUTPATIENT_CLINIC_OR_DEPARTMENT_OTHER)
Admission: EM | Admit: 2021-08-27 | Discharge: 2021-08-27 | Disposition: A | Discharge status: Home / Self Care | Payer: Medicare PPO | Attending: Emergency Medicine | Admitting: Emergency Medicine

## 2021-08-27 DIAGNOSIS — R509 Fever, unspecified: Secondary | ICD-10-CM | POA: Diagnosis not present

## 2021-08-27 DIAGNOSIS — S299XXA Unspecified injury of thorax, initial encounter: Secondary | ICD-10-CM | POA: Insufficient documentation

## 2021-08-27 DIAGNOSIS — W2203XA Walked into furniture, initial encounter: Secondary | ICD-10-CM | POA: Insufficient documentation

## 2021-08-27 DIAGNOSIS — Z7982 Long term (current) use of aspirin: Secondary | ICD-10-CM | POA: Diagnosis not present

## 2021-08-27 DIAGNOSIS — Z950 Presence of cardiac pacemaker: Secondary | ICD-10-CM | POA: Diagnosis not present

## 2021-08-27 DIAGNOSIS — G20 Parkinson's disease: Secondary | ICD-10-CM | POA: Diagnosis not present

## 2021-08-27 DIAGNOSIS — I10 Essential (primary) hypertension: Secondary | ICD-10-CM | POA: Diagnosis not present

## 2021-08-27 DIAGNOSIS — Z79899 Other long term (current) drug therapy: Secondary | ICD-10-CM | POA: Diagnosis not present

## 2021-08-27 DIAGNOSIS — W19XXXA Unspecified fall, initial encounter: Secondary | ICD-10-CM

## 2021-08-27 DIAGNOSIS — R0781 Pleurodynia: Secondary | ICD-10-CM | POA: Diagnosis not present

## 2021-08-27 NOTE — ED Provider Notes (Signed)
?[facility] DEPT ?Provider Note ? ? ?CSN: [id number] ?Arrival date & time: 08/27/21  1400 ? ?  ? ?History ? ?Chief Complaint  ?Patient presents with  ? Rib Injury  ? ? ?[name] is a 71 y.o. male who presents emergency department after a rib injury.  Patient hit the right side of his ribs when falling into a door 2 days ago.  There was no loss of consciousness.  He has had continued right rib pain since, no shortness of breath.  Primary doctor wanted him evaluated for rib fracture.  He is not on chronic anticoagulation. ? ?HPI ? ?  ? ?Home Medications ?Prior to Admission medications   ?Medication Sig Start Date End Date Taking? Authorizing Provider  ?acetaminophen (TYLENOL) 325 MG tablet Take 650 mg by mouth every 6 (six) hours as needed.    [provider]  ?aspirin EC 81 MG tablet Take 1 tablet (81 mg total) by mouth daily. Swallow whole. 11/18/19   Loel Dubonnet, NP  ?Carbidopa-Levodopa ER (RYTARY) 48.75-195 MG CPCR Take 2 capsules by mouth 3 (three) times daily. 08/12/21   Tonia Ghent, MD  ?cholecalciferol (VITAMIN D) 1000 UNITS tablet Take 1,000 Units by mouth daily.    [provider]  ?Cranberry 200 MG CAPS Take by mouth.    [provider]  ?diclofenac Sodium (VOLTAREN) 1 % GEL Apply topically 4 (four) times daily.    [provider]  ?escitalopram (LEXAPRO) 20 MG tablet Take 1 tablet (20 mg total) by mouth daily. 08/12/21   Tonia Ghent, MD  ?fluticasone Asencion Islam) 50 MCG/ACT nasal spray Place 2 sprays into both nostrils daily. 08/12/21   Tonia Ghent, MD  ?Melatonin 10 MG TABS Take 10 mg by mouth at bedtime.    [provider]  ?memantine (NAMENDA) 10 MG tablet TAKE 1 TABLET TWICE A DAY 02/02/21   Tonia Ghent, MD  ?midodrine (PROAMATINE) 5 MG tablet Take 1 tablet (5 mg total) by mouth as needed. For orthostasis, drops in pressure <100 when standing 12/29/20   Minna Merritts, MD  ?naproxen sodium (ALEVE) 220 MG  tablet Take 220 mg by mouth.    [provider]  ?omeprazole (PRILOSEC) 20 MG capsule TAKE 1 CAPSULE DAILY 06/10/21   Tonia Ghent, MD  ?rivastigmine (EXELON) 9.5 mg/24hr 9.5 mg daily. 09/22/19   [provider]  ?   ? ?Allergies    ?Clonazepam, Morphine and related, Prednisone, and Tramadol   ? ?Review of Systems   ?Review of Systems  ?Respiratory:  Negative for shortness of breath.   ?     Chest wall pain  ?Neurological:  Negative for syncope, weakness and headaches.  ?All other systems reviewed and are negative. ? ?Physical Exam ?Updated Vital Signs ?BP 128/83 (BP Location: Right Arm)   Pulse 66   Temp 98.6 ?F (37 ?C) (Oral)   Resp 16   SpO2 98%  ?Physical Exam ?Vitals and nursing note reviewed.  ?Constitutional:   ?   Appearance: Normal appearance.  ?HENT:  ?   Head: Normocephalic and atraumatic.  ?Eyes:  ?   Conjunctiva/sclera: Conjunctivae normal.  ?Cardiovascular:  ?   Rate and Rhythm: Normal rate and regular rhythm.  ?Pulmonary:  ?   Effort: Pulmonary effort is normal. No respiratory distress.  ?   Breath sounds: Normal breath sounds.  ?Chest:  ?   Comments: Chest wall stable.  No bruising noted. ?Abdominal:  ?   General: There  is no distension.  ?   Palpations: Abdomen is soft.  ?   Tenderness: There is no abdominal tenderness.  ?Skin: ?   General: Skin is warm and dry.  ?Neurological:  ?   General: No focal deficit present.  ?   Mental Status: He is alert.  ? ? ?ED Results / Procedures / Treatments   ?Labs ?(all labs ordered are listed, but only abnormal results are displayed) ?Labs Reviewed - No data to display ? ?EKG ?None ? ?Radiology ?DG Ribs Unilateral W/Chest Right ? ?Result Date: 08/27/2021 ?CLINICAL DATA:  Post fall, now with right rib pain. EXAM: RIGHT RIBS AND CHEST - 3+ VIEW COMPARISON:  02/20/2019 FINDINGS: Unchanged cardiac silhouette and mediastinal contours. Interval placement of left anterior chest wall dual lead pacemaker with lead tips projected over the expected  location of the right atrium and ventricle. A neural spinal stimulator power pack again overlies the right chest with lead tips excluded from view. No focal airspace opacities. No pleural effusion or pneumothorax no evidence of edema. No definite displaced right-sided rib fractures with special attention paid to the area demarcated by the radiopaque BB. Regional soft tissues appear normal. No radiopaque foreign body. Post lower lumbar paraspinal fusion, incompletely evaluated. Post cholecystectomy. IMPRESSION: 1.  No acute cardiopulmonary disease. 2. No definite displaced right-sided rib fractures with special attention paid to the area demarcated by the radiopaque BB. Electronically Signed   By: Sandi Mariscal M.D.   On: 08/27/2021 15:46   ? ?Procedures ?Procedures  ? ? ?Medications Ordered in ED ?Medications - No data to display ? ?ED Course/ Medical Decision Making/ A&P ?  ?                        ?Medical Decision Making ?Amount and/or Complexity of Data Reviewed ?Radiology: ordered. ? ? ?This patient is a 71 y.o. male  who presents to the ED for concern of rib injury.  ? ?Past Medical History / Co-morbidities: ?Parkinson's disease, hypertension, GERD, bradycardia s/p pacemaker ? ?Physical Exam: ?Physical exam performed. The pertinent findings include: Chest wall stable with no bruising noted.  Lung sounds are clear bilaterally. ? ?Lab Tests/Imaging studies: ?I Ordered, and personally interpreted labs/imaging including unilateral rib imaging.  The pertinent results include: Acute fractures or dislocations. I agree with the radiologist interpretation. ?  ?Disposition: ?After consideration of the diagnostic results and the patients response to treatment, I feel that patient's not requiring admission or inpatient treatment for his symptoms..  Will discharge to home with routine injury management and recommend follow-up with PCP.Marland Kitchen Discussed reasons to return to the emergency department, and the patient and caregiver  are agreeable to the plan.  ? ?Final Clinical Impression(s) / ED Diagnoses ?Final diagnoses:  ?Rib injury  ?Fall, initial encounter  ? ? ?Rx / DC Orders ?ED Discharge Orders   ? ? None  ? ?  ? ?Portions of this report may have been transcribed using voice recognition software. Every effort was made to ensure accuracy; however, inadvertent computerized transcription errors may be present. ? ?  ?Kateri Plummer, PA-C ?08/27/21 1845 ? ?  ?Davonna Belling, MD ?08/27/21 2350 ? ?

## 2021-08-27 NOTE — Discharge Instructions (Addendum)
He was seen in the emergency department today for rib injury. ? ?As we discussed you did not fracture any ribs.  Anticipate you likely have some muscular soreness in bruising from the fall.  You can take over-the-counter anti-inflammatories as needed for pain. ? ?Continue to monitor how you're doing and return to the ER for new or worsening symptoms.  ?

## 2021-08-27 NOTE — ED Triage Notes (Signed)
Pt fell on his right side on Wednesday and has continued right rib pain.  No sob.  Pt is alert and oriented.  ?

## 2021-08-27 NOTE — Telephone Encounter (Signed)
PLEASE NOTE: All timestamps contained within this report are represented as [place] Standard Time. ?CONFIDENTIALTY NOTICE: This fax transmission is intended only for the addressee. It contains information that is legally privileged, confidential or ?otherwise protected from use or disclosure. If you are not the intended recipient, you are strictly prohibited from reviewing, disclosing, copying using ?or disseminating any of this information or taking any action in reliance on or regarding this information. If you have received this fax in error, please ?notify us immediately by telephone so that we can arrange for its return to [place]. Phone: [phone], Toll-Free: [phone], Fax: [phone] ?Page: 1 of 2 ?Call Id: [id number] ?[facility] - Client ?TELEPHONE ADVICE RECORD ?[facility]? ?Patient ?Name: ?[name] ?RCE ?Gender: Male ?DOB: 02-06-1951 ?Age: 71 Y 2 M 25 D ?Return ?Phone ?Number: ?[phone] ?(Primary) ?Address: ?City/ ?State/ ?Zip: ?[place] ? [address] ?Client [facility] - Client ?Client Site [facility] - Day ?Provider Renford Dills - MD ?Contact Type Call ?Who Is Calling Patient / Member / Family / Caregiver ?Call Type Triage / Clinical ?Caller Name [name] Pirie ?Relationship To Patient Spouse ?Return Phone Number [phone] (Primary) ?Chief Complaint SEVERE ABDOMINAL PAIN - Severe pain in ?abdomen ?Reason for Call Symptomatic / Request for Health Information ?Initial Comment Caller states her husband fell Wednesday night, he ?has Parkinson's. Today he is weak, he is pointing ?at his ribs and stomach but not saying anything. ?[facility] Not Listed urgent care in [place] ?Translation No ?Nurse Assessment ?Nurse: Raphael Gibney, RN, Vanita Ingles Date/Time Eilene Ghazi Time): 08/27/2021 11:31:16 AM ?Confirm and document reason for call. If ?symptomatic, describe symptoms. ?---Caller states spouse fell on Wednesday night into ?the sliding  glass door. he is weak. he is pointing to his ?right side on his rib area. has severe pain. ?Does the patient have any new or worsening ?symptoms? ---Yes ?Will a triage be completed? ---Yes ?Related visit to physician within the last 2 weeks? ---No ?Does the PT have any chronic conditions? (i.e. ?diabetes, asthma, this includes High risk factors for ?pregnancy, etc.) ?---Yes ?List chronic conditions. ---Parkinson's; pacemaker ?Is this a behavioral health or substance abuse call? ---No ?Guidelines ?Guideline Title Affirmed Question Affirmed Notes Nurse Date/Time (Eastern ?Time) ?Chest Injury SEVERE chest pain Raphael Gibney, RN, Vanita Ingles 08/27/2021 11:34:19 ?AM ?Disp. Time (Eastern ?Time) Disposition Final User ?08/27/2021 11:29:42 AM Send to Urgent Sudie Bailey, Tobey ?PLEASE NOTE: All timestamps contained within this report are represented as [place] Standard Time. ?CONFIDENTIALTY NOTICE: This fax transmission is intended only for the addressee. It contains information that is legally privileged, confidential or ?otherwise protected from use or disclosure. If you are not the intended recipient, you are strictly prohibited from reviewing, disclosing, copying using ?or disseminating any of this information or taking any action in reliance on or regarding this information. If you have received this fax in error, please ?notify us immediately by telephone so that we can arrange for its return to [place]. Phone: [phone], Toll-Free: [phone], Fax: [phone] ?Page: 2 of 2 ?Call Id: [id number] ?08/27/2021 11:39:27 AM Go to ED Now Yes Raphael Gibney, RN, Vanita Ingles ?Caller Disagree/Comply Disagree ?Caller Understands Yes ?PreDisposition Call Doctor ?Care Advice Given Per Guideline ?GO TO ED NOW: * You need to be seen in the Emergency Department. NOTE TO TRIAGER - DRIVING: * Another adult should ?drive. CARE ADVICE given per Chest Injury (Adult) guideline. ?Comments ?User: Dannielle Burn, RN Date/Time Eilene Ghazi Time): 08/27/2021 11:39:15  AM ?spouse says she will take him to the  urgent care rather than the ER as she does not have to wait as long ?Referrals ?GO TO FACILITY OTHER - SPECIFY ?

## 2021-08-27 NOTE — Telephone Encounter (Signed)
Spoke to patient's wife by telephone and was advised that she has checked around and the wait is extensive at urgent cares. Patient's wife  was given information on the ER at [facility]. Patient's wife stated that Kalab took a pretty hard fall. Patient's wife is going to get him dressed and take him to [facility] at [place] to be evaluated.  ?

## 2021-08-27 NOTE — ED Notes (Signed)
Discharge paperwork given and understood. 

## 2021-08-29 NOTE — Telephone Encounter (Signed)
Agree. Thanks

## 2021-08-30 ENCOUNTER — Ambulatory Visit: Payer: Medicare PPO | Admitting: Physical Therapy

## 2021-08-30 ENCOUNTER — Ambulatory Visit: Payer: Medicare PPO | Admitting: Occupational Therapy

## 2021-09-01 ENCOUNTER — Ambulatory Visit: Payer: Medicare PPO | Admitting: Physical Therapy

## 2021-09-01 DIAGNOSIS — R2681 Unsteadiness on feet: Secondary | ICD-10-CM

## 2021-09-01 DIAGNOSIS — R269 Unspecified abnormalities of gait and mobility: Secondary | ICD-10-CM

## 2021-09-01 DIAGNOSIS — R296 Repeated falls: Secondary | ICD-10-CM

## 2021-09-01 DIAGNOSIS — R262 Difficulty in walking, not elsewhere classified: Secondary | ICD-10-CM

## 2021-09-01 NOTE — Therapy (Signed)
[name], [place], [address] ?Phone: [phone]   Fax:  [phone] ? ?Physical Therapy Treatment ? ?Patient Details  ?Name: [name] ?MRN: [id number] ?Date of Birth: 27-Apr-1950 ?Referring Provider (PT): Elsie Stain, MD ? ? ?Encounter Date: 09/01/2021 ? ? PT End of Session - 09/01/21 1111   ? ? Visit Number 76   ? Number of Visits 74   ? Date for PT Re-Evaluation 10/04/21   ? Authorization Type [employer] Medicare Choice PPO   ? Progress Note Due on Visit 60   ? PT Start Time 1102   ? PT Stop Time 2595   ? PT Time Calculation (min) 41 min   ? Equipment Utilized During Treatment Gait belt   ? Activity Tolerance Patient tolerated treatment well   ? Behavior During Therapy [facility] for tasks assessed/performed   ? ?  ?  ? ?  ? ? ?Past Medical History:  ?Diagnosis Date  ? Arthritis   ? Bradycardia   ? Cancer [facility]) 2013  ? skin cancer  ? Depression   ? ptsd  ? Dysrhythmia   ? chronic slow heart rate  ? GERD (gastroesophageal reflux disease)   ? Headache(784.0)   ? tension headaches non recent  ? History of chicken pox   ? History of kidney stones   ? passed  ? Hypertension   ? treated with HCTZ  ? Pacemaker   ? Parkinson's disease ([place])   ? dx'ed 15 years ago  ? PTSD (post-traumatic stress disorder)   ? Shortness of breath dyspnea   ? Sleep apnea   ? doesn't use C-pap  ? Varicose veins   ? ? ?Past Surgical History:  ?Procedure Laterality Date  ? CHOLECYSTECTOMY N/A 10/22/2014  ? Procedure: LAPAROSCOPIC CHOLECYSTECTOMY WITH INTRAOPERATIVE CHOLANGIOGRAM;  Surgeon: Dia Crawford III, MD;  Location: [facility] ORS;  Service: General;  Laterality: N/A;  ? cyst removed     ? from lip as a child  ? INTRAMEDULLARY (IM) NAIL INTERTROCHANTERIC N/A 02/18/2019  ? Procedure: INTRAMEDULLARY (IM) NAIL INTERTROCHANTRIC, RIGHT,;  Surgeon: Thornton Park, MD;  Location: [facility] ORS;  Service: Orthopedics;  Laterality: N/A;  ? LUMBAR LAMINECTOMY/DECOMPRESSION  MICRODISCECTOMY Bilateral 12/14/2012  ? Procedure: Bilateral lumbar three-four, four-five decompressive laminotomy/foraminotomy;  Surgeon: Charlie Pitter, MD;  Location: [facility] ORS;  Service: Neurosurgery;  Laterality: Bilateral;  ? PULSE GENERATOR IMPLANT Bilateral 12/13/2013  ? Procedure: Bilateral implantable pulse generator placement;  Surgeon: Erline Levine, MD;  Location: [facility]UR[facility] ORS;  Service: Neurosurgery;  Laterality: Bilateral;  Bilateral implantable pulse generator placement  ? skin cancer removed    ? from ears,   12 lft arm  rt leg 15  ? SUBTHALAMIC STIMULATOR BATTERY REPLACEMENT Bilateral 07/14/2017  ? Procedure: BILATERAL IMPLANTED PULSE GENERATOR CHANGE FOR DEEP BRAIN STIMULATOR;  Surgeon: Erline Levine, MD;  Location: [facility];  Service: Neurosurgery;  Laterality: Bilateral;  ? SUBTHALAMIC STIMULATOR INSERTION Bilateral 12/06/2013  ? Procedure: SUBTHALAMIC STIMULATOR INSERTION;  Surgeon: Erline Levine, MD;  Location: [facility]UR[facility] ORS;  Service: Neurosurgery;  Laterality: Bilateral;  Bilateral deep brain stimulator placement  ? ? ?There were no vitals filed for this visit. ? ? Subjective Assessment - 09/01/21 1109   ? ? Subjective Pt reports he had a couple falls since last visit. He had to go to ER to be evaluated for rib pain.   ? Patient is accompained by: Family member   ? Pertinent History [name] is a  1yoM who comes to [facility] neuro for evaluation of conitnued difficulty of imbalance, unsteadiness on feet, and generally limited tolerance to mobility in the setting of chronic parkinsonism. SInce DC in February, pt has had PPM placement due to bradycardia issues. Pt also started using an UpWalker device 2 week prior to this evaluation, goal was to help with gradual melting of postural extension that occured with sustained upright actiivty. Pt has continued to practice supervised AMB with caregivers, short distances out of house and in community. Pt continues to experience frequent falls on a weekly  basis, at a rate that both he and wife report to be increasing in frequency. PMH: HTN, bradycardia s/p PPM, PD, DBS, COVID-19 infection, chronic low back pain s/p L4/5 surgery.   ? Limitations Walking;Standing;Sitting;House hold activities;Lifting   ? How long can you sit comfortably? Not limited   ? How long can you stand comfortably? 5 minutes with support   ? How long can you walk comfortably? ~5-6 minutes [name]   ? Patient Stated Goals Reduce falls, improve AMB tolerance   ? Currently in Pain? No/denies   ? Pain Onset More than a month ago   ? Pain Onset More than a month ago   ? ?  ?  ? ?  ? ? ?Exercise/Activity Sets/Reps/Time/ Resistance Assistance Charge type Comments- Unless otherwise stated, CGA was provided and gait belt donned in order to ensure pt safety ?  ?Leg Press  15*100# ?12*115# ?8 * 130#  Therex   5-6RPE  ?Increaseds resistance this session   ?      ?      ?Seated hurdle step over sitting on Dyna disc  - sitting on dyna disc to improve postural muscle activation  2 * 10 x 5 sec x  3#   therex Cues for postural adjustments   ?      ?[place] Balance - worked on dual Materials engineer game working on finding shapes and objects on unstable surface while keeping balance in middle of BOS  X many rounds of each  NMR  Weight shifting balance game to improve dynamic weight shifting activities. Improvement with practice noted   ?1 LE on airex, 1 LE on step with head and trunk twist to challenge balance dynamically  2 x 45 sec ea   NMR To improve SLS stability   ?      ?      ?      ?      ?Treatment Provided this session  ? ?Pt educated throughout session about proper posture and technique with exercises. Improved exercise technique, movement at target joints, use of target muscles after min to mod verbal, visual, tactile cues. ? ?Note: Portions of this document were prepared using Dragon voice recognition software and although reviewed may contain unintentional dictation  errors in syntax, grammar, or spelling. ? ? ? ? ? ? ? ? ? ? ? ? ? ? ? ? ? ? ? ? ? ? ? ? ? ? ? ? ? PT Education - 09/01/21 1111   ? ? Education provided Yes   ? Education Details exercise technique   ? Person(s) Educated Patient   ? Methods Explanation   ? Comprehension Verbalized understanding   ? ?  ?  ? ?  ? ? ? PT Short Term Goals - 07/12/21 1107   ? ?  ? PT SHORT TERM GOAL #1  ? Title Pt to demonstrate improved  sustained AMB tolerance >824f.   ? Baseline At evlauation: 6069ftolerance (not to failure); 9/26: [id number]   ? Time 4   ? Period Weeks   ? Status Achieved   ? Target Date 01/05/21   ?  ? PT SHORT TERM GOAL #2  ? Title Pt will improve BERG balance score to greater than 45 to indicate decrease risk of falls.   ? Baseline 2/13:44   ? Time 4   ? Period Weeks   ? Status Achieved   ? Target Date 06/28/21   ?  ? PT SHORT TERM GOAL #3  ? Title Pt to demonstrate 10MWT<13sec to show self-selected gait speed appropriate for AMB outside of house and to improve energy efficiency.   ? Baseline UpWalker Eval: 17.53sec; 9/26: 0.74 m/s (13.5)11/21: 13.68 (.73 m/s) 12.64 on 12/28 9.88 pn2/13   ? Time 4   ? Period Weeks   ? Status Achieved   ? Target Date 02/08/21   ? ?  ?  ? ?  ? ? ? ? PT Long Term Goals - 08/23/21 1131   ? ?  ? PT LONG TERM GOAL #1  ? Title Pt will improve BERG balance score to greater than 47 to indicate decreaed risk of falls.   ? Baseline 41 on 12/28 44 on 2/13 45 on 3/27 5/5: 46   ? Time 8   ? Period Weeks   ? Status On-going   ? Target Date 10/04/21   ?  ? PT LONG TERM GOAL #2  ? Title Pt to tolerate 100065fustained AMB with UpWalker without rest interval to improve safety and independence in limited community AMB.   ? Baseline At eval; tired after 600f3f/26: 1384 ft   ? Time 8   ? Period Weeks   ? Status Achieved   ? Target Date 10/04/21   ?  ? PT LONG TERM GOAL #3  ? Title Pt to demonstrate improved FOTO score by 5 points compared to initital assessment.   ? Baseline 9/26: 49 (eval was 50)  911/21: 52 12/28 deferred as wife not present and pt not best historian 2/13: 43 3/27:55   ? Time 8   ? Period Weeks   ? Status Achieved   ? Target Date 07/26/21   ?  ? PT LONG TERM GOAL #4  ? Title Pt to show tolerance

## 2021-09-03 ENCOUNTER — Encounter: Payer: Self-pay | Admitting: Family Medicine

## 2021-09-05 ENCOUNTER — Other Ambulatory Visit: Payer: Self-pay

## 2021-09-05 ENCOUNTER — Ambulatory Visit
Admission: EM | Admit: 2021-09-05 | Discharge: 2021-09-05 | Disposition: A | Discharge status: Home / Self Care | Payer: Medicare PPO | Attending: Student | Admitting: Student

## 2021-09-05 DIAGNOSIS — R4182 Altered mental status, unspecified: Secondary | ICD-10-CM | POA: Insufficient documentation

## 2021-09-05 DIAGNOSIS — F02818 Dementia in other diseases classified elsewhere, unspecified severity, with other behavioral disturbance: Secondary | ICD-10-CM | POA: Insufficient documentation

## 2021-09-05 DIAGNOSIS — Z9181 History of falling: Secondary | ICD-10-CM | POA: Insufficient documentation

## 2021-09-05 DIAGNOSIS — R519 Headache, unspecified: Secondary | ICD-10-CM | POA: Diagnosis not present

## 2021-09-05 DIAGNOSIS — F32A Depression, unspecified: Secondary | ICD-10-CM | POA: Diagnosis not present

## 2021-09-05 DIAGNOSIS — F0283 Dementia in other diseases classified elsewhere, unspecified severity, with mood disturbance: Secondary | ICD-10-CM | POA: Insufficient documentation

## 2021-09-05 DIAGNOSIS — Z993 Dependence on wheelchair: Secondary | ICD-10-CM | POA: Diagnosis not present

## 2021-09-05 DIAGNOSIS — G20 Parkinson's disease: Secondary | ICD-10-CM | POA: Insufficient documentation

## 2021-09-05 DIAGNOSIS — M545 Low back pain, unspecified: Secondary | ICD-10-CM | POA: Diagnosis not present

## 2021-09-05 LAB — URINALYSIS, ROUTINE W REFLEX MICROSCOPIC
Bilirubin Urine: NEGATIVE
Glucose, UA: NEGATIVE mg/dL
Leukocytes,Ua: NEGATIVE
Nitrite: NEGATIVE
Protein, ur: NEGATIVE mg/dL
Specific Gravity, Urine: 1.015 (ref 1.005–1.030)
pH: 5.5 (ref 5.0–8.0)

## 2021-09-05 LAB — URINALYSIS, MICROSCOPIC (REFLEX)

## 2021-09-05 NOTE — Discharge Instructions (Addendum)
-  [name] does not have a UTI today. We're sending a culture to make sure. This typically takes 2 days to come back.  -The safest option today would be to head to the emergency department for a CT of the head to make sure that there have been no intracranial changes from the falls.  You can alternatively try messaging the neurologist, but if symptoms change at all before you visit them, you must head straight to the emergency department. -I also recommending discussing the recent medication changes with neurology.

## 2021-09-05 NOTE — ED Provider Notes (Signed)
[facility]    CSN: [id number] Arrival date & time: 09/05/21  1059      History   Chief Complaint No chief complaint on file.   HPI [name] is a 71 y.o. male presenting with concern for UTI after altered mental status and 6 falls in the last week. Symptoms actually started 2 weeks ago but seem to be getting worse.   History of Parkinson's disease, dementia.  Here today with wife who helps provide history; patient is an unreliable historian. Apparently, patient has fallen 6 times in the last week, and is now having lower back pain and confusion.  The falls were not witnessed, and patient has difficulty describing the circumstances.  He states that he did hit his head multiple times but does not think that he lost consciousness.  He is having mild headaches, but no known LOC.  Here today with wife, she states that they spoke with the neurologist, who advised him to go to urgent care to rule out UTI.  Wife notes that his medications have been changed recently, increased on the Lexapro.  He is not having abdominal pain, dysuria, hematuria, frequency, flank pain.  Denies shortness of breath, chest pain, dizziness, weakness. Denies worst headache of life, thunderclap headache, weakness/sensation changes in arms/legs, vision changes, shortness of breath, chest pain/pressure, photophobia, phonophobia, n/v/d. He does not take a blood thinner.   HPI  Past Medical History:  Diagnosis Date   Arthritis    Bradycardia    Cancer ([place]) 2013   skin cancer   Depression    ptsd   Dysrhythmia    chronic slow heart rate   GERD (gastroesophageal reflux disease)    Headache(784.0)    tension headaches non recent   History of chicken pox    History of kidney stones    passed   Hypertension    treated with HCTZ   Pacemaker    Parkinson's disease ([place])    dx'ed 15 years ago   PTSD (post-traumatic stress disorder)    Shortness of breath dyspnea    Sleep apnea    doesn't use C-pap    Varicose veins     Patient Active Problem List   Diagnosis Date Noted   Sinus drainage 12/09/2020   Medicare annual wellness visit, subsequent 11/18/2020   Pacemaker    Dementia associated with Parkinson's disease ([place]) 06/17/2020   Vertigo 02/19/2020   Urine abnormality 02/19/2020   Dysphagia 02/23/2019   Fall at home 10/18/2017   REM behavioral disorder 11/02/2016   Radicular pain in right arm 10/09/2016   GERD (gastroesophageal reflux disease) 07/31/2016   Colon cancer screening 07/23/2015   Gout 02/18/2015   Depression 01/06/2015   Skin lesion 10/02/2014   Dupuytren's contracture 08/20/2014   Cough 07/21/2014   Lumbar stenosis with neurogenic claudication 06/13/2014   S/P deep brain stimulator placement 05/08/2014   Advance care planning 01/19/2014   Parkinson's disease ([place]) 12/13/2013   PTSD (post-traumatic stress disorder) 06/13/2013   Erectile dysfunction 06/13/2013   HLD (hyperlipidemia) 06/13/2013   Essential hypertension 06/03/2013   Bradycardia by electrocardiogram 06/03/2013   Obstructive sleep apnea 03/12/2013    Past Surgical History:  Procedure Laterality Date   CHOLECYSTECTOMY N/A 10/22/2014   Procedure: LAPAROSCOPIC CHOLECYSTECTOMY WITH INTRAOPERATIVE CHOLANGIOGRAM;  Surgeon: Dia Crawford III, MD;  Location: [facility];  Service: General;  Laterality: N/A;   cyst removed      from lip as a child   INTRAMEDULLARY (IM) NAIL INTERTROCHANTERIC N/A  02/18/2019   Procedure: INTRAMEDULLARY (IM) NAIL INTERTROCHANTRIC, RIGHT,;  Surgeon: Thornton Park, MD;  Location: [facility];  Service: Orthopedics;  Laterality: N/A;   LUMBAR LAMINECTOMY/DECOMPRESSION MICRODISCECTOMY Bilateral 12/14/2012   Procedure: Bilateral lumbar three-four, four-five decompressive laminotomy/foraminotomy;  Surgeon: Charlie Pitter, MD;  Location: [facility];  Service: Neurosurgery;  Laterality: Bilateral;   PULSE GENERATOR IMPLANT Bilateral 12/13/2013   Procedure: Bilateral implantable pulse  generator placement;  Surgeon: Erline Levine, MD;  Location: [facility];  Service: Neurosurgery;  Laterality: Bilateral;  Bilateral implantable pulse generator placement   skin cancer removed     from ears,   12 lft arm  rt leg 15   SUBTHALAMIC STIMULATOR BATTERY REPLACEMENT Bilateral 07/14/2017   Procedure: BILATERAL IMPLANTED PULSE GENERATOR CHANGE FOR DEEP BRAIN STIMULATOR;  Surgeon: Erline Levine, MD;  Location: [facility];  Service: Neurosurgery;  Laterality: Bilateral;   SUBTHALAMIC STIMULATOR INSERTION Bilateral 12/06/2013   Procedure: SUBTHALAMIC STIMULATOR INSERTION;  Surgeon: Erline Levine, MD;  Location: [facility];  Service: Neurosurgery;  Laterality: Bilateral;  Bilateral deep brain stimulator placement       Home Medications    Prior to Admission medications   Medication Sig Start Date End Date Taking? Authorizing Provider  acetaminophen (TYLENOL) 325 MG tablet Take 650 mg by mouth every 6 (six) hours as needed.   Yes [provider]  aspirin EC 81 MG tablet Take 1 tablet (81 mg total) by mouth daily. Swallow whole. 11/18/19  Yes Loel Dubonnet, NP  Carbidopa-Levodopa ER (RYTARY) 48.75-195 MG CPCR Take 2 capsules by mouth 3 (three) times daily. 08/12/21  Yes Tonia Ghent, MD  cholecalciferol (VITAMIN D) 1000 UNITS tablet Take 1,000 Units by mouth daily.   Yes [provider]  Cranberry 200 MG CAPS Take by mouth.   Yes [provider]  diclofenac Sodium (VOLTAREN) 1 % GEL Apply topically 4 (four) times daily.   Yes [provider]  escitalopram (LEXAPRO) 20 MG tablet Take 1 tablet (20 mg total) by mouth daily. 08/12/21  Yes Tonia Ghent, MD  fluticasone [facility]) 50 MCG/ACT nasal spray Place 2 sprays into both nostrils daily. 08/12/21  Yes Tonia Ghent, MD  Melatonin 10 MG TABS Take 10 mg by mouth at bedtime.   Yes [provider]  memantine (NAMENDA) 10 MG tablet TAKE 1 TABLET TWICE A DAY 02/02/21  Yes Tonia Ghent, MD   midodrine (PROAMATINE) 5 MG tablet Take 1 tablet (5 mg total) by mouth as needed. For orthostasis, drops in pressure <100 when standing 12/29/20  Yes Gollan, Kathlene November, MD  naproxen sodium (ALEVE) 220 MG tablet Take 220 mg by mouth.   Yes [provider]  omeprazole (PRILOSEC) 20 MG capsule TAKE 1 CAPSULE DAILY 06/10/21  Yes Tonia Ghent, MD  rivastigmine (EXELON) 9.5 mg/24hr 9.5 mg daily. 09/22/19  Yes [provider]    Family History Family History  Problem Relation Age of Onset   Lung cancer Father    Colon cancer Neg Hx    Prostate cancer Neg Hx     Social History Social History   Tobacco Use   Smoking status: Never   Smokeless tobacco: Former  Scientific laboratory technician Use: Never used  Substance Use Topics   Alcohol use: Yes    Alcohol/week: 0.0 standard drinks    Comment: occasional (twice per month)   Drug use: No     Allergies   Clonazepam, Morphine and related, Prednisone, and Tramadol  Review of Systems Review of Systems  Neurological:  Positive for headaches.  All other systems reviewed and are negative.   Physical Exam Triage Vital Signs ED Triage Vitals  Enc Vitals Group     BP 09/05/21 1156 (!) 171/83     Pulse Rate 09/05/21 1156 60     Resp 09/05/21 1156 20     Temp 09/05/21 1156 98 F (36.7 C)     Temp Source 09/05/21 1156 Oral     SpO2 09/05/21 1156 96 %     Weight 09/05/21 1154 180 lb (81.6 kg)     Height 09/05/21 1154 '5\' 9"'$  (1.753 m)     Head Circumference --      Peak Flow --      Pain Score 09/05/21 1154 0     Pain Loc --      Pain Edu? --      Excl. in [place]? --    No data found.  Updated Vital Signs BP (!) 171/83 (BP Location: Right Arm)   Pulse 60   Temp 98 F (36.7 C) (Oral)   Resp 20   Ht '5\' 9"'$  (1.753 m)   Wt 180 lb (81.6 kg)   SpO2 96%   BMI 26.58 kg/m   Visual Acuity Right Eye Distance:   Left Eye Distance:   Bilateral Distance:    Right Eye Near:   Left Eye Near:    Bilateral Near:      Physical Exam Vitals reviewed.  Constitutional:      General: He is not in acute distress.    Appearance: Normal appearance. He is not ill-appearing.  HENT:     Head: Normocephalic and atraumatic.     Right Ear: Tympanic membrane, ear canal and external ear normal. No tenderness. No middle ear effusion. There is no impacted cerumen. Tympanic membrane is not perforated, erythematous, retracted or bulging.     Left Ear: Tympanic membrane, ear canal and external ear normal. No tenderness.  No middle ear effusion. There is no impacted cerumen. Tympanic membrane is not perforated, erythematous, retracted or bulging.     Nose: Nose normal. No congestion.     Mouth/Throat:     Mouth: Mucous membranes are moist.     Pharynx: Uvula midline. No oropharyngeal exudate or posterior oropharyngeal erythema.  Eyes:     Extraocular Movements: Extraocular movements intact.     Pupils: Pupils are equal, round, and reactive to light.  Cardiovascular:     Rate and Rhythm: Normal rate and regular rhythm.     Heart sounds: Normal heart sounds.  Pulmonary:     Effort: Pulmonary effort is normal.     Breath sounds: Normal breath sounds. No decreased breath sounds, wheezing, rhonchi or rales.  Abdominal:     Palpations: Abdomen is soft.     Tenderness: There is no abdominal tenderness. There is no guarding or rebound.     Comments: No abdominal pain or flank pain.  Musculoskeletal:     Comments: Exam limited as patient is wheelchair-bound.  Left-sided lumbar paraspinous muscle tenderness to palpation.  Pain elicited with flexion lumbar spine.  No midline spinous tenderness, deformity, step-off.  There is some bilateral hip pain to palpation, but no pain with internal or external rotation.  No obvious bony deformity.  Lymphadenopathy:     Cervical: No cervical adenopathy.     Right cervical: No superficial cervical adenopathy.    Left cervical: No superficial cervical adenopathy.  Neurological:  General: No focal deficit present.     Mental Status: He is alert and oriented to person, place, and time.     Comments: AOx3.  Cranial nerves II through XII grossly intact.  PERRLA, EOMI.  Strength and sensation grossly intact, but patient is in wheelchair given fall risk.  Negative Romberg, pronator drift, finger to thumb.  Psychiatric:        Mood and Affect: Mood normal.        Behavior: Behavior normal.        Thought Content: Thought content normal.        Judgment: Judgment normal.     UC Treatments / Results  Labs (all labs ordered are listed, but only abnormal results are displayed) Labs Reviewed  URINALYSIS, ROUTINE W REFLEX MICROSCOPIC - Abnormal; Notable for the following components:      Result Value   Hgb urine dipstick TRACE (*)    Ketones, ur TRACE (*)    All other components within normal limits  URINALYSIS, MICROSCOPIC (REFLEX) - Abnormal; Notable for the following components:   Bacteria, UA RARE (*)    All other components within normal limits  URINE CULTURE    EKG   Radiology No results found.  Procedures Procedures (including critical care time)  Medications Ordered in UC Medications - No data to display  Initial Impression / Assessment and Plan / UC Course  I have reviewed the triage vital signs and the nursing notes.  Pertinent labs & imaging results that were available during my care of the patient were reviewed by me and considered in my medical decision making (see chart for details).     This patient is a very pleasant 71 y.o. year old male presenting with confusion and 6 falls in the last week.  He has a high fall risk due to Parkinson's disease and dementia, the falls were not witnessed, and patient states he did hit his head.  He does not take a blood thinner.  I strongly advised evaluation in the emergency department, but wife states that she will probably contact the neurologist instead.  They understand that I cannot rule out intracranial  pathology or bleed in the urgent care setting.  Urine is fairly normal, I did send a culture, but presentation is not consistent with UTI as there is no abdominal pain, flank pain, fever/chills.  He is having multiple musculoskeletal complaints from the falls, they can evaluate this in the emergency department.  Final Clinical Impressions(s) / UC Diagnoses   Final diagnoses:  At high risk for falls  Parkinson's disease [facility])     Discharge Instructions      -Chanc Kervin Aultman does not have a UTI today. We're sending a culture to make sure. This typically takes 2 days to come back.  -The safest option today would be to head to the emergency department for a CT of the head to make sure that there have been no intracranial changes from the falls.  You can alternatively try messaging the neurologist, but if symptoms change at all before you visit them, you must head straight to the emergency department. -I also recommending discussing the recent medication changes with neurology.   ED Prescriptions   None    PDMP not reviewed this encounter.   Hazel Sams, PA-C 09/05/21 1319

## 2021-09-05 NOTE — ED Triage Notes (Addendum)
Patient c.o falling 6 times in the past week, lower back pain, very confused. Patient spoke with PCP and thinks it might be a UTI   Symptoms have been going on for about 2 weeks now.   There has been cone recent changes in his medication.

## 2021-09-06 ENCOUNTER — Ambulatory Visit: Payer: Medicare PPO | Admitting: Physical Therapy

## 2021-09-06 ENCOUNTER — Ambulatory Visit: Payer: Medicare PPO | Admitting: Occupational Therapy

## 2021-09-06 DIAGNOSIS — R296 Repeated falls: Secondary | ICD-10-CM | POA: Diagnosis not present

## 2021-09-06 DIAGNOSIS — R269 Unspecified abnormalities of gait and mobility: Secondary | ICD-10-CM | POA: Diagnosis not present

## 2021-09-06 DIAGNOSIS — R2681 Unsteadiness on feet: Secondary | ICD-10-CM

## 2021-09-06 DIAGNOSIS — R262 Difficulty in walking, not elsewhere classified: Secondary | ICD-10-CM | POA: Diagnosis not present

## 2021-09-06 LAB — URINE CULTURE: Culture: NO GROWTH

## 2021-09-06 NOTE — Therapy (Signed)
[place] MAIN [facility] SERVICES [address] [place], [place], [address] Phone: [phone]   Fax:  [phone]  Physical Therapy Treatment  Patient Details  Name: [name] MRN: [id number] Date of Birth: [birth year] Referring Provider (PT): Elsie Stain, MD   Encounter Date: 09/06/2021   PT End of Session - 09/06/21 1055     Visit Number 63    Number of Visits 64    Date for PT Re-Evaluation 10/04/21    Authorization Type [employer] Choice PPO    Progress Note Due on Visit 59    PT Start Time 1046    PT Stop Time 1128    PT Time Calculation (min) 42 min    Equipment Utilized During Treatment Gait belt    Activity Tolerance Patient tolerated treatment well    Behavior During Therapy WFL for tasks assessed/performed             Past Medical History:  Diagnosis Date   Arthritis    Bradycardia    Cancer ([place]) 2013   skin cancer   Depression    ptsd   Dysrhythmia    chronic slow heart rate   GERD (gastroesophageal reflux disease)    Headache(784.0)    tension headaches non recent   History of chicken pox    History of kidney stones    passed   Hypertension    treated with HCTZ   Pacemaker    Parkinson's disease ([place])    dx'ed 15 years ago   PTSD (post-traumatic stress disorder)    Shortness of breath dyspnea    Sleep apnea    doesn't use C-pap   Varicose veins     Past Surgical History:  Procedure Laterality Date   CHOLECYSTECTOMY N/A 10/22/2014   Procedure: LAPAROSCOPIC CHOLECYSTECTOMY WITH INTRAOPERATIVE CHOLANGIOGRAM;  Surgeon: Dia Crawford III, MD;  Location: [facility] ORS;  Service: General;  Laterality: N/A;   cyst removed      from lip as a child   INTRAMEDULLARY (IM) NAIL INTERTROCHANTERIC N/A 02/18/2019   Procedure: INTRAMEDULLARY (IM) NAIL INTERTROCHANTRIC, RIGHT,;  Surgeon: Thornton Park, MD;  Location: [facility] OR[facility];  Service: Orthopedics;  Laterality: N/A;   LUMBAR LAMINECTOMY/DECOMPRESSION  MICRODISCECTOMY Bilateral 12/14/2012   Procedure: Bilateral lumbar three-four, four-five decompressive laminotomy/foraminotomy;  Surgeon: Charlie Pitter, MD;  Location: [place] [facility];  Service: Neurosurgery;  Laterality: Bilateral;   PULSE GENERATOR IMPLANT Bilateral 12/13/2013   Procedure: Bilateral implantable pulse generator placement;  Surgeon: Erline Levine, MD;  Location: [place] [facility];  Service: Neurosurgery;  Laterality: Bilateral;  Bilateral implantable pulse generator placement   skin cancer removed     from ears,   12 lft arm  rt leg 15   SUBTHALAMIC STIMULATOR BATTERY REPLACEMENT Bilateral 07/14/2017   Procedure: BILATERAL IMPLANTED PULSE GENERATOR CHANGE FOR DEEP BRAIN STIMULATOR;  Surgeon: Erline Levine, MD;  Location: [place];  Service: Neurosurgery;  Laterality: Bilateral;   SUBTHALAMIC STIMULATOR INSERTION Bilateral 12/06/2013   Procedure: SUBTHALAMIC STIMULATOR INSERTION;  Surgeon: Erline Levine, MD;  Location: [place] [facility];  Service: Neurosurgery;  Laterality: Bilateral;  Bilateral deep brain stimulator placement    There were no vitals filed for this visit.   Subjective Assessment - 09/06/21 1051     Subjective Pt reports he had a couple falls since last visit. He had to go to ER to be evaluated for rib pain.    Patient is accompained by: Family member    Pertinent History [name] is a  27yoM who comes to [facility] for evaluation of conitnued difficulty of imbalance, unsteadiness on feet, and generally limited tolerance to mobility in the setting of chronic parkinsonism. SInce DC in February, pt has had PPM placement due to bradycardia issues. Pt also started using an UpWalker device 2 week prior to this evaluation, goal was to help with gradual melting of postural extension that occured with sustained upright actiivty. Pt has continued to practice supervised AMB with caregivers, short distances out of house and in community. Pt continues to experience frequent falls on a weekly  basis, at a rate that both he and wife report to be increasing in frequency. PMH: HTN, bradycardia s/p PPM, PD, DBS, COVID-19 infection, chronic low back pain s/p L4/5 surgery.    Limitations Walking;Standing;Sitting;House hold activities;Lifting    How long can you sit comfortably? Not limited    How long can you stand comfortably? 5 minutes with support    How long can you walk comfortably? ~5-6 minutes UpWalker Walker    Patient Stated Goals Reduce falls, improve AMB tolerance    Pain Onset More than a month ago    Pain Onset More than a month ago             Exercise/Activity Sets/Reps/Time/ Resistance Assistance Charge type Comments- Unless otherwise stated, CGA was provided and gait belt donned in order to ensure pt safety   Leg Press  15*100# 12*115# 10 * 130#  Therex   5-6RPE  Increased resistance this session   Seated on dynadisc  LAQ  Marching 2*10 ea w/7.5#  Therex    Standing large side step with 3# AW 2 x 10 ea   There act   To improve LE control.   Seated hurdle step over sitting on Dyna disc  - sitting on dyna disc to improve postural muscle activation  2 * 10 x 5 sec x  3#   therex Cues for postural adjustments   Standng anterior step wth no UE support  2 x 5 ea LE   There act  Had to stop after 5 steps due to proximity to steps                                       Treatment Provided this session   Pt educated throughout session about proper posture and technique with exercises. Improved exercise technique, movement at target joints, use of target muscles after min to mod verbal, visual, tactile cues.  Note: Portions of this document were prepared using Dragon voice recognition software and although reviewed may contain unintentional dictation errors in syntax, grammar, or spelling.                                PT Education - 09/06/21 1054     Education provided Yes    Education Details exercise technique    Person(s) Educated  Patient    Methods Explanation    Comprehension Verbalized understanding              PT Short Term Goals - 07/12/21 1107       PT SHORT TERM GOAL #1   Title Pt to demonstrate improved sustained AMB tolerance >8103f.    Baseline At evlauation: 6030ftolerance (not to failure); 9/26: 1384 ft    Time 4    Period Weeks  Status Achieved    Target Date 01/05/21      PT SHORT TERM GOAL #2   Title Pt will improve BERG balance score to greater than 45 to indicate decrease risk of falls.    Baseline 2/13:44    Time 4    Period Weeks    Status Achieved    Target Date 06/28/21      PT SHORT TERM GOAL #3   Title Pt to demonstrate 10MWT<13sec to show self-selected gait speed appropriate for AMB outside of house and to improve energy efficiency.    Baseline UpWalker Eval: 17.53sec; 9/26: 0.74 m/s (13.5)11/21: 13.68 (.73 m/s) 12.64 on 12/28 9.88 pn2/13    Time 4    Period Weeks    Status Achieved    Target Date 02/08/21               PT Long Term Goals - 08/23/21 1131       PT LONG TERM GOAL #1   Title Pt will improve BERG balance score to greater than 47 to indicate decreaed risk of falls.    Baseline 41 on 12/28 44 on 2/13 45 on 3/27 5/5: 46    Time 8    Period Weeks    Status On-going    Target Date 10/04/21      PT LONG TERM GOAL #2   Title Pt to tolerate 1074f sustained AMB with UpWalker without rest interval to improve safety and independence in limited community AMB.    Baseline At eval; tired after 6033f 9/26: 1384 ft    Time 8    Period Weeks    Status Achieved    Target Date 10/04/21      PT LONG TERM GOAL #3   Title Pt to demonstrate improved FOTO score by 5 points compared to initital assessment.    Baseline 9/26: 49 (eval was 50) 911/21: 52 12/28 deferred as wife not present and pt not best historian 2/13: 4358/27:55    Time 8    Period Weeks    Status Achieved    Target Date 07/26/21      PT LONG TERM GOAL #4   Title Pt to show tolerance of  AMB>130022fith UpWalker to improve tolerance to accessing the community for IADL.    Baseline Eval: 600f102f9/26: 1385    Time 12    Period Weeks    Status Achieved      PT LONG TERM GOAL #5   Title Patient will increase six minute walk test distance to >1000 for progression to community ambulator and improve gait ability    Baseline 9/26: new 11/21: 850 feet with up Walker 12/28: 925 feet with up walker, 980 feet with up walker, 07/12/21: 790 feet with up walker 5/8: 1160 feet    Time 8    Period Weeks    Status Achieved    Target Date 07/26/21      PT LONG TERM GOAL #6   Title Patient will experience 1 fall or less per week for at least 3 weeks in order to improve his frequency of falls and prevent injury from falls    Baseline On average multiple falls per week.  07/12/2021, patient had 4 falls in the last 3 weeks 5/8: 1 fall in past 3 weeks    Time 12    Period Weeks    Status Achieved    Target Date 08/23/21  Plan - 09/06/21 1055     Clinical Impression Statement Pt completed primarilly seated LE strength and endurance raining this session secondary to many falls over the weekend on a Men's retreat as well as a fall on a road when he got out of the car without an AD. Pt repoerts no increase in low back pain with therapy this session per his preference to perform seated exercises to prevent increased low back pain. Pt did not appear to have deterioration in balance since last session but did show some impulsivity when going from sit to stand and performing amambualtin activities whcih may have led to his falls over the weekend. He could also have had increased confusion secondary to significant change in his routine and surroundings (different care givers, mens retreat, etc). Patient will continue to benefit from skilled physical therapy intervention in order to improve his balance, safety with everyday tasks, decrease his risk of falls, improve his quality of  life.    Personal Factors and Comorbidities Age;Past/Current Experience;Time since onset of injury/illness/exacerbation;Transportation    Comorbidities Chronic back pain, orthostatic hypotension, repeated falls, parkinsonism    Examination-Activity Limitations Bathing;Bed Mobility;Caring for Others;Bend;Dressing;Lift;Locomotion Level;Reach Overhead;Sit;Squat;Stairs;Stand;Toileting;Transfers;Carry    Examination-Participation Restrictions Cleaning;Community Activity;Interpersonal Relationship;Shop;Volunteer;Yard Work;Other;Driving    Stability/Clinical Decision Making Evolving/Moderate complexity    Rehab Potential Fair    PT Frequency 2x / week    PT Duration 12 weeks    PT Treatment/Interventions ADLs/Self Care Home Management;Aquatic Therapy;Cryotherapy;Electrical Stimulation;Iontophoresis '4mg'$ /ml Dexamethasone;Moist Heat;Ultrasound;Contrast Bath;DME Instruction;Gait training;Stair training;Functional mobility training;Therapeutic activities;Therapeutic exercise;Balance training;Neuromuscular re-education;Patient/family education;Manual techniques;Wheelchair mobility training;Energy conservation;Passive range of motion;Traction;Orthotic Fit/Training;Dry needling;Vestibular;Visual/perceptual remediation/compensation;Taping;Canalith Repostioning;Cognitive remediation    PT Next Visit Plan Progress static and dynamic balance and continue with caregiver/patient ed with low back stretching    PT Home Exercise Plan None set up at evaluation, still working on previous HEP from 6 months prior (STS, daily walking); 8/29: issued STS with focus on large amplitude movement at support surface; 9/12: PT provides handout with sit to stand exercise and large amplitude seated trunk rotation exercise.  9/19: reinforced HEP, provided handout for seated postural exercise with UE abduction, and seated exercise with trunk twists and UE reach, 10x for each, can be performed daily or minimum of 4x/week. No changes today.     Consulted and Agree with Plan of Care Patient;Family member/caregiver    Family Member Consulted Wife             Patient will benefit from skilled therapeutic intervention in order to improve the following deficits and impairments:  Abnormal gait, Decreased balance, Decreased coordination, Decreased mobility, Postural dysfunction, Decreased strength, Decreased safety awareness, Improper body mechanics, Impaired flexibility, Decreased activity tolerance, Decreased endurance, Decreased knowledge of precautions, Difficulty walking, Pain, Cardiopulmonary status limiting activity, Decreased range of motion, Impaired perceived functional ability, Decreased cognition, Dizziness  Visit Diagnosis: Difficulty in walking, not elsewhere classified  Unsteadiness on feet  Repeated falls  Abnormality of gait and mobility     Problem List Patient Active Problem List   Diagnosis Date Noted   Sinus drainage 12/09/2020   Medicare annual wellness visit, subsequent 11/18/2020   Pacemaker    Dementia associated with Parkinson's disease ([place]) 06/17/2020   Vertigo 02/19/2020   Urine abnormality 02/19/2020   Dysphagia 02/23/2019   Fall at home 10/18/2017   REM behavioral disorder 11/02/2016   Radicular pain in right arm 10/09/2016   GERD (gastroesophageal reflux disease) 07/31/2016   Colon cancer screening 07/23/2015   Gout 02/18/2015   Depression 01/06/2015  Skin lesion 10/02/2014   Dupuytren's contracture 08/20/2014   Cough 07/21/2014   Lumbar stenosis with neurogenic claudication 06/13/2014   S/P deep brain stimulator placement 05/08/2014   Advance care planning 01/19/2014   Parkinson's disease ([place]) 12/13/2013   PTSD (post-traumatic stress disorder) 06/13/2013   Erectile dysfunction 06/13/2013   HLD (hyperlipidemia) 06/13/2013   Essential hypertension 06/03/2013   Bradycardia by electrocardiogram 06/03/2013   Obstructive sleep apnea 03/12/2013    Particia Lather, PT 09/06/2021,  11:46 AM  [place], [place], [address] Phone: [phone]   Fax:  [phone]  Name: [name] MRN: [id number] Date of Birth: February 17, 1951

## 2021-09-08 ENCOUNTER — Ambulatory Visit: Payer: Medicare PPO | Admitting: Physical Therapy

## 2021-09-08 DIAGNOSIS — R296 Repeated falls: Secondary | ICD-10-CM

## 2021-09-08 DIAGNOSIS — R269 Unspecified abnormalities of gait and mobility: Secondary | ICD-10-CM

## 2021-09-08 DIAGNOSIS — R262 Difficulty in walking, not elsewhere classified: Secondary | ICD-10-CM | POA: Diagnosis not present

## 2021-09-08 DIAGNOSIS — R2681 Unsteadiness on feet: Secondary | ICD-10-CM

## 2021-09-08 NOTE — Therapy (Signed)
[place] MAIN [facility] SERVICES [address] [place], [place], [address] Phone: [phone]   Fax:  [phone]  Physical Therapy Treatment  Patient Details  Name: [name] MRN: [id number] Date of Birth: 1950-07-20 Referring Provider (PT): Elsie Stain, MD   Encounter Date: 09/08/2021   PT End of Session - 09/08/21 1114     Visit Number 64    Number of Visits 68    Date for PT Re-Evaluation 10/04/21    Authorization Type [employer] Medicare Choice PPO    Progress Note Due on Visit 58    PT Start Time 1103    PT Stop Time 1145    PT Time Calculation (min) 42 min    Equipment Utilized During Treatment Gait belt    Activity Tolerance Patient tolerated treatment well    Behavior During Therapy WFL for tasks assessed/performed             Past Medical History:  Diagnosis Date   Arthritis    Bradycardia    Cancer ([place]) 2013   skin cancer   Depression    ptsd   Dysrhythmia    chronic slow heart rate   GERD (gastroesophageal reflux disease)    Headache(784.0)    tension headaches non recent   History of chicken pox    History of kidney stones    passed   Hypertension    treated with HCTZ   Pacemaker    Parkinson's disease ([place])    dx'ed 15 years ago   PTSD (post-traumatic stress disorder)    Shortness of breath dyspnea    Sleep apnea    doesn't use C-pap   Varicose veins     Past Surgical History:  Procedure Laterality Date   CHOLECYSTECTOMY N/A 10/22/2014   Procedure: LAPAROSCOPIC CHOLECYSTECTOMY WITH INTRAOPERATIVE CHOLANGIOGRAM;  Surgeon: Dia Crawford III, MD;  Location: [facility] ORS;  Service: General;  Laterality: N/A;   cyst removed      from lip as a child   INTRAMEDULLARY (IM) NAIL INTERTROCHANTERIC N/A 02/18/2019   Procedure: INTRAMEDULLARY (IM) NAIL INTERTROCHANTRIC, RIGHT,;  Surgeon: Thornton Park, MD;  Location: [facility] ORS;  Service: Orthopedics;  Laterality: N/A;   LUMBAR LAMINECTOMY/DECOMPRESSION  MICRODISCECTOMY Bilateral 12/14/2012   Procedure: Bilateral lumbar three-four, four-five decompressive laminotomy/foraminotomy;  Surgeon: Charlie Pitter, MD;  Location: [place] NEURO ORS;  Service: Neurosurgery;  Laterality: Bilateral;   PULSE GENERATOR IMPLANT Bilateral 12/13/2013   Procedure: Bilateral implantable pulse generator placement;  Surgeon: Erline Levine, MD;  Location: [place] NEURO ORS;  Service: Neurosurgery;  Laterality: Bilateral;  Bilateral implantable pulse generator placement   skin cancer removed     from ears,   12 lft arm  rt leg 15   SUBTHALAMIC STIMULATOR BATTERY REPLACEMENT Bilateral 07/14/2017   Procedure: BILATERAL IMPLANTED PULSE GENERATOR CHANGE FOR DEEP BRAIN STIMULATOR;  Surgeon: Erline Levine, MD;  Location: [place];  Service: Neurosurgery;  Laterality: Bilateral;   SUBTHALAMIC STIMULATOR INSERTION Bilateral 12/06/2013   Procedure: SUBTHALAMIC STIMULATOR INSERTION;  Surgeon: Erline Levine, MD;  Location: [place] NEURO ORS;  Service: Neurosurgery;  Laterality: Bilateral;  Bilateral deep brain stimulator placement    There were no vitals filed for this visit.   Subjective Assessment - 09/08/21 1111     Subjective Pt reports no significant changes since last session. His caregiver reports his falls over the weekend were due to inadequate medication management as she counts 1/3 of mis medication was not taken. Pt reports not falls since last session.  Patient is accompained by: Family member    Pertinent History [name] is a 2yoM who comes to [facility] neuro for evaluation of conitnued difficulty of imbalance, unsteadiness on feet, and generally limited tolerance to mobility in the setting of chronic parkinsonism. SInce DC in February, pt has had PPM placement due to bradycardia issues. Pt also started using an UpWalker device 2 week prior to this evaluation, goal was to help with gradual melting of postural extension that occured with sustained upright actiivty. Pt has continued to  practice supervised AMB with caregivers, short distances out of house and in community. Pt continues to experience frequent falls on a weekly basis, at a rate that both he and wife report to be increasing in frequency. PMH: HTN, bradycardia s/p PPM, PD, DBS, COVID-19 infection, chronic low back pain s/p L4/5 surgery.    Limitations Walking;Standing;Sitting;House hold activities;Lifting    How long can you sit comfortably? Not limited    How long can you stand comfortably? 5 minutes with support    How long can you walk comfortably? ~5-6 minutes UpWalker Walker    Patient Stated Goals Reduce falls, improve AMB tolerance    Currently in Pain? No/denies    Pain Score 3     Pain Location Back    Pain Orientation Lower    Pain Descriptors / Indicators Aching    Pain Onset More than a month ago    Pain Onset More than a month ago             Exercise/Activity Sets/Reps/Time/ Resistance Assistance Charge type Comments- Unless otherwise stated, CGA was provided and gait belt donned in order to ensure pt safety   Leg Press  15*100# 12*115# 2*8 * 130#  Therex   5-6RPE  Cues for breathing technique throughout   Seated on dynadisc  LAQ  Marching 2*10 ea w/7.5#  Therex    Standing large side step to airex pad  with 3# AW 2 x 10 ea x 3 # AW   NMR  To improve LE control. No UE support this session as pt progresses   Standing large anterior step to large ( 16 inch) step  2 * 10 w/  3#   therex Cues to improve foot clearance                                             Treatment Provided this session   Pt educated throughout session about proper posture and technique with exercises. Improved exercise technique, movement at target joints, use of target muscles after min to mod verbal, visual, tactile cues.  Note: Portions of this document were prepared using Dragon voice recognition software and although reviewed may contain unintentional dictation errors in syntax, grammar, or  spelling.                               PT Education - 09/08/21 1233     Education provided Yes    Education Details exercise technique    Person(s) Educated Patient    Methods Explanation    Comprehension Verbalized understanding              PT Short Term Goals - 07/12/21 1107       PT SHORT TERM GOAL #1   Title Pt to demonstrate improved sustained  AMB tolerance >867f.    Baseline At evlauation: 6053ftolerance (not to failure); 9/26: 1384 ft    Time 4    Period Weeks    Status Achieved    Target Date 01/05/21      PT SHORT TERM GOAL #2   Title Pt will improve BERG balance score to greater than 45 to indicate decrease risk of falls.    Baseline 2/13:44    Time 4    Period Weeks    Status Achieved    Target Date 06/28/21      PT SHORT TERM GOAL #3   Title Pt to demonstrate 10MWT<13sec to show self-selected gait speed appropriate for AMB outside of house and to improve energy efficiency.    Baseline UpWalker Eval: 17.53sec; 9/26: 0.74 m/s (13.5)11/21: 13.68 (.73 m/s) 12.64 on 12/28 9.88 pn2/13    Time 4    Period Weeks    Status Achieved    Target Date 02/08/21               PT Long Term Goals - 08/23/21 1131       PT LONG TERM GOAL #1   Title Pt will improve BERG balance score to greater than 47 to indicate decreaed risk of falls.    Baseline 41 on 12/28 44 on 2/13 45 on 3/27 5/5: 46    Time 8    Period Weeks    Status On-going    Target Date 10/04/21      PT LONG TERM GOAL #2   Title Pt to tolerate 100076fustained AMB with UpWalker without rest interval to improve safety and independence in limited community AMB.    Baseline At eval; tired after 600f79f/26: 1384 ft    Time 8    Period Weeks    Status Achieved    Target Date 10/04/21      PT LONG TERM GOAL #3   Title Pt to demonstrate improved FOTO score by 5 points compared to initital assessment.    Baseline 9/26: 49 (eval was 50) 911/21: 52 12/28 deferred as  wife not present and pt not best historian 2/13: 43 3397:[id number]    Time 8    Period Weeks    Status Achieved    Target Date 07/26/21      PT LONG TERM GOAL #4   Title Pt to show tolerance of AMB>1300ft60fh UpWalker to improve tolerance to accessing the community for IADL.    Baseline Eval: 600ft;48f26: 1385    Time 12    Period Weeks    Status Achieved      PT LONG TERM GOAL #5   Title Patient will increase six minute walk test distance to >1000 for progression to community ambulator and improve gait ability    Baseline 9/26: new 11/21: 850 feet with up Walker 12/28: 925 feet with up walker, 980 feet with up walker, 07/12/21: 790 feet with up walker 5/8: 1160 feet    Time 8    Period Weeks    Status Achieved    Target Date 07/26/21      PT LONG TERM GOAL #6   Title Patient will experience 1 fall or less per week for at least 3 weeks in order to improve his frequency of falls and prevent injury from falls    Baseline On average multiple falls per week.  07/12/2021, patient had 4 falls in the last 3 weeks 5/8: 1 fall in past 3 weeks  Time 12    Period Weeks    Status Achieved    Target Date 08/23/21                   Plan - 09/08/21 1115     Clinical Impression Statement Patient reports his low back pain was improved this date was able to complete more standing activities.  Patient's caregiver does report his falls are likely due to improper medication management during his meds to treat as when he returned home she noticed one third of his medication had not been taken over the weekend which likely led to many of his falls and loss of balance.  Patient continue to progress with lower extremity strengthening on leg press machine this date as well as continue to progress with lateral stepping without upper extremity support.  Patient will continue to benefit from skilled physical therapy in order to improve his balance, safety with task, decrease risk of falls, improve his  quality of life.    Personal Factors and Comorbidities Age;Past/Current Experience;Time since onset of injury/illness/exacerbation;Transportation    Comorbidities Chronic back pain, orthostatic hypotension, repeated falls, parkinsonism    Examination-Activity Limitations Bathing;Bed Mobility;Caring for Others;Bend;Dressing;Lift;Locomotion Level;Reach Overhead;Sit;Squat;Stairs;Stand;Toileting;Transfers;Carry    Examination-Participation Restrictions Cleaning;Community Activity;Interpersonal Relationship;Shop;Volunteer;Yard Work;Other;Driving    Stability/Clinical Decision Making Evolving/Moderate complexity    Rehab Potential Fair    PT Frequency 2x / week    PT Duration 12 weeks    PT Treatment/Interventions ADLs/Self Care Home Management;Aquatic Therapy;Cryotherapy;Electrical Stimulation;Iontophoresis '4mg'$ /ml Dexamethasone;Moist Heat;Ultrasound;Contrast Bath;DME Instruction;Gait training;Stair training;Functional mobility training;Therapeutic activities;Therapeutic exercise;Balance training;Neuromuscular re-education;Patient/family education;Manual techniques;Wheelchair mobility training;Energy conservation;Passive range of motion;Traction;Orthotic Fit/Training;Dry needling;Vestibular;Visual/perceptual remediation/compensation;Taping;Canalith Repostioning;Cognitive remediation    PT Next Visit Plan Progress static and dynamic balance and continue with caregiver/patient ed with low back stretching    PT Home Exercise Plan None set up at evaluation, still working on previous HEP from 6 months prior (STS, daily walking); 8/29: issued STS with focus on large amplitude movement at support surface; 9/12: PT provides handout with sit to stand exercise and large amplitude seated trunk rotation exercise.  9/19: reinforced HEP, provided handout for seated postural exercise with UE abduction, and seated exercise with trunk twists and UE reach, 10x for each, can be performed daily or minimum of 4x/week. No changes  today.    Consulted and Agree with Plan of Care Patient;Family member/caregiver    Family Member Consulted Wife             Patient will benefit from skilled therapeutic intervention in order to improve the following deficits and impairments:  Abnormal gait, Decreased balance, Decreased coordination, Decreased mobility, Postural dysfunction, Decreased strength, Decreased safety awareness, Improper body mechanics, Impaired flexibility, Decreased activity tolerance, Decreased endurance, Decreased knowledge of precautions, Difficulty walking, Pain, Cardiopulmonary status limiting activity, Decreased range of motion, Impaired perceived functional ability, Decreased cognition, Dizziness  Visit Diagnosis: Difficulty in walking, not elsewhere classified  Unsteadiness on feet  Repeated falls  Abnormality of gait and mobility     Problem List Patient Active Problem List   Diagnosis Date Noted   Sinus drainage 12/09/2020   Medicare annual wellness visit, subsequent 11/18/2020   Pacemaker    Dementia associated with Parkinson's disease ([place]) 06/17/2020   Vertigo 02/19/2020   Urine abnormality 02/19/2020   Dysphagia 02/23/2019   Fall at home 10/18/2017   REM behavioral disorder 11/02/2016   Radicular pain in right arm 10/09/2016   GERD (gastroesophageal reflux disease) 07/31/2016   Colon cancer screening 07/23/2015   Gout 02/18/2015  Depression 01/06/2015   Skin lesion 10/02/2014   Dupuytren's contracture 08/20/2014   Cough 07/21/2014   Lumbar stenosis with neurogenic claudication 06/13/2014   S/P deep brain stimulator placement 05/08/2014   Advance care planning 01/19/2014   Parkinson's disease ([place]) 12/13/2013   PTSD (post-traumatic stress disorder) 06/13/2013   Erectile dysfunction 06/13/2013   HLD (hyperlipidemia) 06/13/2013   Essential hypertension 06/03/2013   Bradycardia by electrocardiogram 06/03/2013   Obstructive sleep apnea 03/12/2013    Particia Lather,  PT 09/08/2021, 12:35 PM  [place] MAIN [facility] SERVICES [address] [place], [place], [address] Phone: [phone]   Fax:  [phone]  Name: [name] MRN: [id number] Date of Birth: Apr 15, 1951

## 2021-09-09 NOTE — Progress Notes (Signed)
Remote pacemaker transmission.   

## 2021-09-14 ENCOUNTER — Ambulatory Visit (INDEPENDENT_AMBULATORY_CARE_PROVIDER_SITE_OTHER): Payer: Medicare PPO | Admitting: Psychology

## 2021-09-14 DIAGNOSIS — Z9689 Presence of other specified functional implants: Secondary | ICD-10-CM | POA: Diagnosis not present

## 2021-09-14 DIAGNOSIS — G20 Parkinson's disease: Secondary | ICD-10-CM | POA: Diagnosis not present

## 2021-09-14 DIAGNOSIS — F4323 Adjustment disorder with mixed anxiety and depressed mood: Secondary | ICD-10-CM

## 2021-09-14 DIAGNOSIS — R441 Visual hallucinations: Secondary | ICD-10-CM | POA: Diagnosis not present

## 2021-09-14 DIAGNOSIS — F331 Major depressive disorder, recurrent, moderate: Secondary | ICD-10-CM

## 2021-09-14 NOTE — Progress Notes (Signed)
[facility] Counselor/Therapist Progress Note  Patient ID: HALEEM HANNER, MRN: [id number],    Date: 09/14/2021  Time Spent: 45 minutes  Treatment Type: Indivdual  Reported Symptoms: sadness, and boredom  Mental Status Exam: Appearance:  Casual     Behavior: Drowsy  Motor: Psychomotor Retardation  Speech/Language:  Slow  Affect: Blunt  Mood: normal  Thought process: concrete  Thought content:   WNL  Sensory/Perceptual disturbances:   WNL  Orientation: oriented to person, place, time/date, and situation  Attention: Fair  Concentration: Fair  Memory: Immediate;   [place] of knowledge:  Fair  Insight:   Fair  Judgment:  Fair  Impulse Control: Fair   Risk Assessment: Danger to Self:  No Self-injurious Behavior: No Danger to Others: No Duty to Warn:no Physical Aggression / Violence:No  Access to Firearms a concern: No  Gang Involvement:No   Subjective: The patient attended a face-to-face individual therapy session via video visit today.  The patient gave verbal consent for the video to be on WebEx.  The patient was in his home  and therapist was in the office.  The patient presents with a flat affect and mood is pleasant.  The patient reports that he has had a lot of falls over the last 3 weeks.  He went to the neurologist and she said that the falls are just related to his parkinson's disease.  The patient talked about being sad about this.  We talked about what the patient has to be grateful for so that he would be able to redirect himself from the negative.   Interventions: Ego-Supportive, CBT  Diagnosis:Major depressive disorder, recurrent episode, moderate (HCC)  Adjustment disorder with mixed anxiety and depressed mood  Plan: Please see Treatment plan in Therapy Charts with target date of 04/30/2022 for goals and progress towards goals.  Plan is to continue to offer supportive therapy to patient to help him deal with his depression related to his  Parkinsons diagnosis.  Patient approved Treatment Plan in Therapy Charts.  Kahdijah Errickson G Arelys Glassco, LCSW                  Caydn Justen G Salim Forero, LCSW               Khaden Gater G Muhammed Teutsch, LCSW               Jaison Petraglia G Jazell Rosenau, LCSW               Derald Lorge G Daily Crate, LCSW               Bristal Steffy G Erskin Zinda, LCSW               Cristal Qadir G Harlen Danford, LCSW

## 2021-09-15 ENCOUNTER — Encounter: Payer: Self-pay | Admitting: Physical Therapy

## 2021-09-15 ENCOUNTER — Ambulatory Visit: Payer: Medicare PPO | Admitting: Physical Therapy

## 2021-09-15 DIAGNOSIS — R269 Unspecified abnormalities of gait and mobility: Secondary | ICD-10-CM

## 2021-09-15 DIAGNOSIS — R262 Difficulty in walking, not elsewhere classified: Secondary | ICD-10-CM

## 2021-09-15 DIAGNOSIS — R2681 Unsteadiness on feet: Secondary | ICD-10-CM | POA: Diagnosis not present

## 2021-09-15 DIAGNOSIS — R296 Repeated falls: Secondary | ICD-10-CM | POA: Diagnosis not present

## 2021-09-15 NOTE — Therapy (Signed)
[place] MAIN [facility] SERVICES [address] [place], [place], [address] Phone: [phone]   Fax:  [phone]  Physical Therapy Treatment  Patient Details  Name: [name] MRN: [id number] Date of Birth: [birth year] Referring Provider (PT): Elsie Stain, MD   Encounter Date: 09/15/2021   PT End of Session - 09/15/21 1237     Visit Number 65    Number of Visits 52    Date for PT Re-Evaluation 10/04/21    Authorization Type [employer] Choice PPO    Progress Note Due on Visit 70    PT Start Time 1104    PT Stop Time 1142    PT Time Calculation (min) 38 min    Equipment Utilized During Treatment Gait belt    Activity Tolerance Patient tolerated treatment well    Behavior During Therapy WFL for tasks assessed/performed             Past Medical History:  Diagnosis Date   Arthritis    Bradycardia    Cancer ([place]) 2013   skin cancer   Depression    ptsd   Dysrhythmia    chronic slow heart rate   GERD (gastroesophageal reflux disease)    Headache(784.0)    tension headaches non recent   History of chicken pox    History of kidney stones    passed   Hypertension    treated with HCTZ   Pacemaker    Parkinson's disease ([place])    dx'ed 15 years ago   PTSD (post-traumatic stress disorder)    Shortness of breath dyspnea    Sleep apnea    doesn't use C-pap   Varicose veins     Past Surgical History:  Procedure Laterality Date   CHOLECYSTECTOMY N/A 10/22/2014   Procedure: LAPAROSCOPIC CHOLECYSTECTOMY WITH INTRAOPERATIVE CHOLANGIOGRAM;  Surgeon: Dia Crawford III, MD;  Location: [facility] ORS;  Service: General;  Laterality: N/A;   cyst removed      from lip as a child   INTRAMEDULLARY (IM) NAIL INTERTROCHANTERIC N/A 02/18/2019   Procedure: INTRAMEDULLARY (IM) NAIL INTERTROCHANTRIC, RIGHT,;  Surgeon: Thornton Park, MD;  Location: [facility] ORS;  Service: Orthopedics;  Laterality: N/A;   LUMBAR LAMINECTOMY/DECOMPRESSION  MICRODISCECTOMY Bilateral 12/14/2012   Procedure: Bilateral lumbar three-four, four-five decompressive laminotomy/foraminotomy;  Surgeon: Charlie Pitter, MD;  Location: [place] NEURO ORS;  Service: Neurosurgery;  Laterality: Bilateral;   PULSE GENERATOR IMPLANT Bilateral 12/13/2013   Procedure: Bilateral implantable pulse generator placement;  Surgeon: Erline Levine, MD;  Location: [place] NEURO ORS;  Service: Neurosurgery;  Laterality: Bilateral;  Bilateral implantable pulse generator placement   skin cancer removed     from ears,   12 lft arm  rt leg 15   SUBTHALAMIC STIMULATOR BATTERY REPLACEMENT Bilateral 07/14/2017   Procedure: BILATERAL IMPLANTED PULSE GENERATOR CHANGE FOR DEEP BRAIN STIMULATOR;  Surgeon: Erline Levine, MD;  Location: [place];  Service: Neurosurgery;  Laterality: Bilateral;   SUBTHALAMIC STIMULATOR INSERTION Bilateral 12/06/2013   Procedure: SUBTHALAMIC STIMULATOR INSERTION;  Surgeon: Erline Levine, MD;  Location: [place] NEURO ORS;  Service: Neurosurgery;  Laterality: Bilateral;  Bilateral deep brain stimulator placement    There were no vitals filed for this visit.   Subjective Assessment - 09/15/21 1103     Subjective Pt reports no significant changes since last session. His caregiver reports his falls over the weekend were due to inadequate medication management as she counts 1/3 of mis medication was not taken. Pt reports not falls since last session.  Patient is accompained by: Family member    Pertinent History [name] is a 40yoM who comes to [facility] neuro for evaluation of conitnued difficulty of imbalance, unsteadiness on feet, and generally limited tolerance to mobility in the setting of chronic parkinsonism. SInce DC in February, pt has had PPM placement due to bradycardia issues. Pt also started using an UpWalker device 2 week prior to this evaluation, goal was to help with gradual melting of postural extension that occured with sustained upright actiivty. Pt has continued to  practice supervised AMB with caregivers, short distances out of house and in community. Pt continues to experience frequent falls on a weekly basis, at a rate that both he and wife report to be increasing in frequency. PMH: HTN, bradycardia s/p PPM, PD, DBS, COVID-19 infection, chronic low back pain s/p L4/5 surgery.    Limitations Walking;Standing;Sitting;House hold activities;Lifting    How long can you sit comfortably? Not limited    How long can you stand comfortably? 5 minutes with support    How long can you walk comfortably? ~5-6 minutes UpWalker Walker    Patient Stated Goals Reduce falls, improve AMB tolerance    Currently in Pain? Yes    Pain Score 6     Pain Location Back    Pain Orientation Lower    Pain Onset More than a month ago    Pain Onset More than a month ago             Exercise/Activity Sets/Reps/Time/ Resistance Assistance Charge type Comments- Unless otherwise stated, CGA was provided and gait belt donned in order to ensure pt safety   Leg Press  15*100# 12*115# 2*8 * 130#  Therex   5-6RPE  Cues for breathing technique throughout   Seated on dynadisc   Marching and step over hurdle- increased difficulty with R LE copared to L 2*10 ea w/2.5# AW  NMR  Increased dificulty on R LE, cues for upright posture   Standing large side step to airex pad  with 3# AW 2 x 10 ea x 3 # AW   NMR  To improve LE control. No UE support this session as pt progresses   Standing large anterior step to large (16 inch) step  2 * 10 w/  3#   therex Cues to improve foot clearance                                             Treatment Provided this session   Pt educated throughout session about proper posture and technique with exercises. Improved exercise technique, movement at target joints, use of target muscles after min to mod verbal, visual, tactile cues.  Note: Portions of this document were prepared using Dragon voice recognition software and although reviewed may contain  unintentional dictation errors in syntax, grammar, or spelling.                             PT Education - 09/15/21 1237     Education provided Yes    Education Details Exercise technique    Person(s) Educated Patient    Methods Explanation    Comprehension Verbalized understanding              PT Short Term Goals - 07/12/21 1107       PT SHORT TERM  GOAL #1   Title Pt to demonstrate improved sustained AMB tolerance >89f.    Baseline At evlauation: 6043ftolerance (not to failure); 9/26: 1384 ft    Time 4    Period Weeks    Status Achieved    Target Date 01/05/21      PT SHORT TERM GOAL #2   Title Pt will improve BERG balance score to greater than 45 to indicate decrease risk of falls.    Baseline 2/13:44    Time 4    Period Weeks    Status Achieved    Target Date 06/28/21      PT SHORT TERM GOAL #3   Title Pt to demonstrate 10MWT<13sec to show self-selected gait speed appropriate for AMB outside of house and to improve energy efficiency.    Baseline UpWalker Eval: 17.53sec; 9/26: 0.74 m/s (13.5)11/21: 13.68 (.73 m/s) 12.64 on 12/28 9.88 pn2/13    Time 4    Period Weeks    Status Achieved    Target Date 02/08/21               PT Long Term Goals - 08/23/21 1131       PT LONG TERM GOAL #1   Title Pt will improve BERG balance score to greater than 47 to indicate decreaed risk of falls.    Baseline 41 on 12/28 44 on 2/13 45 on 3/27 5/5: 46    Time 8    Period Weeks    Status On-going    Target Date 10/04/21      PT LONG TERM GOAL #2   Title Pt to tolerate 100025fustained AMB with UpWalker without rest interval to improve safety and independence in limited community AMB.    Baseline At eval; tired after 600f61f/26: 1384 ft    Time 8    Period Weeks    Status Achieved    Target Date 10/04/21      PT LONG TERM GOAL #3   Title Pt to demonstrate improved FOTO score by 5 points compared to initital assessment.    Baseline 9/26:  49 (eval was 50) 911/21: 52 12/28 deferred as wife not present and pt not best historian 2/13: 43 3767:55    Time 8    Period Weeks    Status Achieved    Target Date 07/26/21      PT LONG TERM GOAL #4   Title Pt to show tolerance of AMB>1300ft76fh UpWalker to improve tolerance to accessing the community for IADL.    Baseline Eval: 600ft;41f26: 1385    Time 12    Period Weeks    Status Achieved      PT LONG TERM GOAL #5   Title Patient will increase six minute walk test distance to >1000 for progression to community ambulator and improve gait ability    Baseline 9/26: new 11/21: 850 feet with up Walker 12/28: 925 feet with up walker, 980 feet with up walker, 07/12/21: 790 feet with up walker 5/8: 1160 feet    Time 8    Period Weeks    Status Achieved    Target Date 07/26/21      PT LONG TERM GOAL #6   Title Patient will experience 1 fall or less per week for at least 3 weeks in order to improve his frequency of falls and prevent injury from falls    Baseline On average multiple falls per week.  07/12/2021, patient had 4 falls in the last  3 weeks 5/8: 1 fall in past 3 weeks    Time 12    Period Weeks    Status Achieved    Target Date 08/23/21                   Plan - 09/15/21 1238     Clinical Impression Statement Patient continues to present with excellent motivation for completion of physical therapy activities.  Patient has had no falls since last session.    Personal Factors and Comorbidities Age;Past/Current Experience;Time since onset of injury/illness/exacerbation;Transportation    Comorbidities Chronic back pain, orthostatic hypotension, repeated falls, parkinsonism    Examination-Activity Limitations Bathing;Bed Mobility;Caring for Others;Bend;Dressing;Lift;Locomotion Level;Reach Overhead;Sit;Squat;Stairs;Stand;Toileting;Transfers;Carry    Examination-Participation Restrictions Cleaning;Community Activity;Interpersonal Relationship;Shop;Volunteer;Yard  Work;Other;Driving    Stability/Clinical Decision Making Evolving/Moderate complexity    Rehab Potential Fair    PT Frequency 2x / week    PT Duration 12 weeks    PT Treatment/Interventions ADLs/Self Care Home Management;Aquatic Therapy;Cryotherapy;Electrical Stimulation;Iontophoresis '4mg'$ /ml Dexamethasone;Moist Heat;Ultrasound;Contrast Bath;DME Instruction;Gait training;Stair training;Functional mobility training;Therapeutic activities;Therapeutic exercise;Balance training;Neuromuscular re-education;Patient/family education;Manual techniques;Wheelchair mobility training;Energy conservation;Passive range of motion;Traction;Orthotic Fit/Training;Dry needling;Vestibular;Visual/perceptual remediation/compensation;Taping;Canalith Repostioning;Cognitive remediation    PT Next Visit Plan Progress static and dynamic balance and continue with caregiver/patient ed with low back stretching    PT Home Exercise Plan None set up at evaluation, still working on previous HEP from 6 months prior (STS, daily walking); 8/29: issued STS with focus on large amplitude movement at support surface; 9/12: PT provides handout with sit to stand exercise and large amplitude seated trunk rotation exercise.  9/19: reinforced HEP, provided handout for seated postural exercise with UE abduction, and seated exercise with trunk twists and UE reach, 10x for each, can be performed daily or minimum of 4x/week. No changes today.    Consulted and Agree with Plan of Care Patient;Family member/caregiver    Family Member Consulted Wife             Patient will benefit from skilled therapeutic intervention in order to improve the following deficits and impairments:  Abnormal gait, Decreased balance, Decreased coordination, Decreased mobility, Postural dysfunction, Decreased strength, Decreased safety awareness, Improper body mechanics, Impaired flexibility, Decreased activity tolerance, Decreased endurance, Decreased knowledge of  precautions, Difficulty walking, Pain, Cardiopulmonary status limiting activity, Decreased range of motion, Impaired perceived functional ability, Decreased cognition, Dizziness  Visit Diagnosis: Unsteadiness on feet  Repeated falls  Abnormality of gait and mobility  Difficulty in walking, not elsewhere classified     Problem List Patient Active Problem List   Diagnosis Date Noted   Sinus drainage 12/09/2020   Medicare annual wellness visit, subsequent 11/18/2020   Pacemaker    Dementia associated with Parkinson's disease ([place]) 06/17/2020   Vertigo 02/19/2020   Urine abnormality 02/19/2020   Dysphagia 02/23/2019   Fall at home 10/18/2017   REM behavioral disorder 11/02/2016   Radicular pain in right arm 10/09/2016   GERD (gastroesophageal reflux disease) 07/31/2016   Colon cancer screening 07/23/2015   Gout 02/18/2015   Depression 01/06/2015   Skin lesion 10/02/2014   Dupuytren's contracture 08/20/2014   Cough 07/21/2014   Lumbar stenosis with neurogenic claudication 06/13/2014   S/P deep brain stimulator placement 05/08/2014   Advance care planning 01/19/2014   Parkinson's disease ([place]) 12/13/2013   PTSD (post-traumatic stress disorder) 06/13/2013   Erectile dysfunction 06/13/2013   HLD (hyperlipidemia) 06/13/2013   Essential hypertension 06/03/2013   Bradycardia by electrocardiogram 06/03/2013   Obstructive sleep apnea 03/12/2013    Particia Lather, PT 09/15/2021, 12:53  PM  [place] MAIN [facility] SERVICES [address] [place], [place], [address] Phone: [phone]   Fax:  [phone]  Name: [name] MRN: [id number] Date of Birth: Aug 08, 1950

## 2021-09-20 ENCOUNTER — Ambulatory Visit: Payer: Medicare PPO | Attending: Family Medicine

## 2021-09-20 ENCOUNTER — Encounter: Payer: Medicare PPO | Admitting: Occupational Therapy

## 2021-09-20 DIAGNOSIS — R262 Difficulty in walking, not elsewhere classified: Secondary | ICD-10-CM | POA: Insufficient documentation

## 2021-09-20 DIAGNOSIS — R2681 Unsteadiness on feet: Secondary | ICD-10-CM | POA: Diagnosis not present

## 2021-09-20 DIAGNOSIS — R278 Other lack of coordination: Secondary | ICD-10-CM | POA: Insufficient documentation

## 2021-09-20 DIAGNOSIS — R296 Repeated falls: Secondary | ICD-10-CM | POA: Diagnosis not present

## 2021-09-20 DIAGNOSIS — G20 Parkinson's disease: Secondary | ICD-10-CM | POA: Insufficient documentation

## 2021-09-20 DIAGNOSIS — M6281 Muscle weakness (generalized): Secondary | ICD-10-CM | POA: Diagnosis not present

## 2021-09-20 NOTE — Therapy (Signed)
[place] MAIN [facility] SERVICES [address] [place], [place], [address] Phone: [phone]   Fax:  [phone]  Physical Therapy Treatment  Patient Details  Name: [name] MRN: [id number] Date of Birth: 19-Apr-1950 Referring Provider (PT): Elsie Stain, MD   Encounter Date: 09/20/2021   PT End of Session - 09/20/21 1124     Visit Number 56    Number of Visits 5    Date for PT Re-Evaluation 10/04/21    Authorization Type [employer] Choice PPO    Progress Note Due on Visit 11    PT Start Time 1105    PT Stop Time 1146    PT Time Calculation (min) 41 min    Equipment Utilized During Treatment Gait belt    Activity Tolerance Patient tolerated treatment well    Behavior During Therapy WFL for tasks assessed/performed             Past Medical History:  Diagnosis Date   Arthritis    Bradycardia    Cancer ([place]) 2013   skin cancer   Depression    ptsd   Dysrhythmia    chronic slow heart rate   GERD (gastroesophageal reflux disease)    Headache(784.0)    tension headaches non recent   History of chicken pox    History of kidney stones    passed   Hypertension    treated with HCTZ   Pacemaker    Parkinson's disease ([place])    dx'ed 15 years ago   PTSD (post-traumatic stress disorder)    Shortness of breath dyspnea    Sleep apnea    doesn't use C-pap   Varicose veins     Past Surgical History:  Procedure Laterality Date   CHOLECYSTECTOMY N/A 10/22/2014   Procedure: LAPAROSCOPIC CHOLECYSTECTOMY WITH INTRAOPERATIVE CHOLANGIOGRAM;  Surgeon: Dia Crawford III, MD;  Location: [facility] ORS;  Service: General;  Laterality: N/A;   cyst removed      from lip as a child   INTRAMEDULLARY (IM) NAIL INTERTROCHANTERIC N/A 02/18/2019   Procedure: INTRAMEDULLARY (IM) NAIL INTERTROCHANTRIC, RIGHT,;  Surgeon: Thornton Park, MD;  Location: [facility] ORS;  Service: Orthopedics;  Laterality: N/A;   LUMBAR LAMINECTOMY/DECOMPRESSION MICRODISCECTOMY  Bilateral 12/14/2012   Procedure: Bilateral lumbar three-four, four-five decompressive laminotomy/foraminotomy;  Surgeon: Charlie Pitter, MD;  Location: [place] [facility];  Service: Neurosurgery;  Laterality: Bilateral;   PULSE GENERATOR IMPLANT Bilateral 12/13/2013   Procedure: Bilateral implantable pulse generator placement;  Surgeon: Erline Levine, MD;  Location: [place] [facility];  Service: Neurosurgery;  Laterality: Bilateral;  Bilateral implantable pulse generator placement   skin cancer removed     from ears,   12 lft arm  rt leg 15   SUBTHALAMIC STIMULATOR BATTERY REPLACEMENT Bilateral 07/14/2017   Procedure: BILATERAL IMPLANTED PULSE GENERATOR CHANGE FOR DEEP BRAIN STIMULATOR;  Surgeon: Erline Levine, MD;  Location: [place];  Service: Neurosurgery;  Laterality: Bilateral;   SUBTHALAMIC STIMULATOR INSERTION Bilateral 12/06/2013   Procedure: SUBTHALAMIC STIMULATOR INSERTION;  Surgeon: Erline Levine, MD;  Location: [place] [facility];  Service: Neurosurgery;  Laterality: Bilateral;  Bilateral deep brain stimulator placement    There were no vitals filed for this visit.   Subjective Assessment - 09/20/21 1108     Subjective Pt has PCP appointment Wednesday. Per pt's caregiver pt to switch to U-step at home. Pt has not fallen but has stumbled/tripped into furniture. Reports no injuries.    Patient is accompained by: Family member    Pertinent  History [name] is a 70yoM who comes to [facility] neuro for evaluation of conitnued difficulty of imbalance, unsteadiness on feet, and generally limited tolerance to mobility in the setting of chronic parkinsonism. SInce DC in February, pt has had PPM placement due to bradycardia issues. Pt also started using an UpWalker device 2 week prior to this evaluation, goal was to help with gradual melting of postural extension that occured with sustained upright actiivty. Pt has continued to practice supervised AMB with caregivers, short distances out of house and in community. Pt  continues to experience frequent falls on a weekly basis, at a rate that both he and wife report to be increasing in frequency. PMH: HTN, bradycardia s/p PPM, PD, DBS, COVID-19 infection, chronic low back pain s/p L4/5 surgery.    Limitations Walking;Standing;Sitting;House hold activities;Lifting    How long can you sit comfortably? Not limited    How long can you stand comfortably? 5 minutes with support    How long can you walk comfortably? ~5-6 minutes UpWalker Walker    Patient Stated Goals Reduce falls, improve AMB tolerance    Currently in Pain? No/denies    Pain Onset More than a month ago    Pain Onset More than a month ago             INTERVENTIONS  Exercise/Activity Sets/Reps/Time/ Resistance Assistance Charge type Comments- Unless otherwise stated, CGA was provided and gait belt donned in order to ensure pt safety    Leg Press  15*100# 1x10 1x15 '@115'$ #    Therex   Pt rates intervention hard Cues for breathing technique throughout   Seated on dynadisc    Marching and step over hurdle- laterally  2*10 ea w/2.5# AW   NMR  cues for upright posture , step-length  Seated on dynadisc    Marching and step onto 6" step  2*20 ea w/2.5# AW  NMR Cuing for upright posture  Large amplitude STS technique with reach 12x  NMR Multi-modal cues required  Standing large side step to 6" step with 3# AW X multiple reps ea x 3 # AW    NMR  To improve LE control.   Standing large anterior step to large (6 inch) step  2 * 15 w/  3#    therex Cues to improve foot clearance/increase step length     Pt educated throughout session about proper posture and technique with exercises. Improved exercise technique, movement at target joints, use of target muscles after min to mod verbal, visual, tactile cues.  Rationale for Evaluation and Treatment Rehabilitation       PT Education - 09/20/21 1310     Education provided Yes    Education Details exercise technique, large amplitude STS technique     Person(s) Educated Patient    Methods Explanation;Demonstration;Tactile cues;Verbal cues    Comprehension Verbalized understanding;Returned demonstration;Verbal cues required;Tactile cues required;Need further instruction              PT Short Term Goals - 07/12/21 1107       PT SHORT TERM GOAL #1   Title Pt to demonstrate improved sustained AMB tolerance >872f.    Baseline At evlauation: 6081ftolerance (not to failure); 9/26: 1384 ft    Time 4    Period Weeks    Status Achieved    Target Date 01/05/21      PT SHORT TERM GOAL #2   Title Pt will improve BERG balance score to greater than 45 to indicate  decrease risk of falls.    Baseline 2/13:44    Time 4    Period Weeks    Status Achieved    Target Date 06/28/21      PT SHORT TERM GOAL #3   Title Pt to demonstrate 10MWT<13sec to show self-selected gait speed appropriate for AMB outside of house and to improve energy efficiency.    Baseline UpWalker Eval: 17.53sec; 9/26: 0.74 m/s (13.5)11/21: 13.68 (.73 m/s) 12.64 on 12/28 9.88 pn2/13    Time 4    Period Weeks    Status Achieved    Target Date 02/08/21               PT Long Term Goals - 08/23/21 1131       PT LONG TERM GOAL #1   Title Pt will improve BERG balance score to greater than 47 to indicate decreaed risk of falls.    Baseline 41 on 12/28 44 on 2/13 45 on 3/27 5/5: 46    Time 8    Period Weeks    Status On-going    Target Date 10/04/21      PT LONG TERM GOAL #2   Title Pt to tolerate 103f sustained AMB with UpWalker without rest interval to improve safety and independence in limited community AMB.    Baseline At eval; tired after 6066f 9/26: 1384 ft    Time 8    Period Weeks    Status Achieved    Target Date 10/04/21      PT LONG TERM GOAL #3   Title Pt to demonstrate improved FOTO score by 5 points compared to initital assessment.    Baseline 9/26: 49 (eval was 50) 911/21: 52 12/28 deferred as wife not present and pt not best historian  2/13: 436/27:55    Time 8    Period Weeks    Status Achieved    Target Date 07/26/21      PT LONG TERM GOAL #4   Title Pt to show tolerance of AMB>130058fith UpWalker to improve tolerance to accessing the community for IADL.    Baseline Eval: 600f50f9/26: 1385    Time 12    Period Weeks    Status Achieved      PT LONG TERM GOAL #5   Title Patient will increase six minute walk test distance to >1000 for progression to community ambulator and improve gait ability    Baseline 9/26: new 11/21: 850 feet with up Walker 12/28: 925 feet with up walker, 980 feet with up walker, 07/12/21: 790 feet with up walker 5/8: 1160 feet    Time 8    Period Weeks    Status Achieved    Target Date 07/26/21      PT LONG TERM GOAL #6   Title Patient will experience 1 fall or less per week for at least 3 weeks in order to improve his frequency of falls and prevent injury from falls    Baseline On average multiple falls per week.  07/12/2021, patient had 4 falls in the last 3 weeks 5/8: 1 fall in past 3 weeks    Time 12    Period Weeks    Status Achieved    Target Date 08/23/21                   Plan - 09/20/21 1313     Clinical Impression Statement Author continues plan as laid out in previous sessions. Pt slightly limited today on  leg press, rating intervention as hard, and unable to progress beyond 115#. While pt with some difficulty, he tolerated all interventions well without pain, and showed good activity tolerance by performing multiple reps of weighted standing activities. The pt will benefit from further skilled PT to improve fall risk and QOL.    Personal Factors and Comorbidities Age;Past/Current Experience;Time since onset of injury/illness/exacerbation;Transportation    Comorbidities Chronic back pain, orthostatic hypotension, repeated falls, parkinsonism    Examination-Activity Limitations Bathing;Bed Mobility;Caring for Others;Bend;Dressing;Lift;Locomotion Level;Reach  Overhead;Sit;Squat;Stairs;Stand;Toileting;Transfers;Carry    Examination-Participation Restrictions Cleaning;Community Activity;Interpersonal Relationship;Shop;Volunteer;Yard Work;Other;Driving    Stability/Clinical Decision Making Evolving/Moderate complexity    Rehab Potential Fair    PT Frequency 2x / week    PT Duration 12 weeks    PT Treatment/Interventions ADLs/Self Care Home Management;Aquatic Therapy;Cryotherapy;Electrical Stimulation;Iontophoresis '4mg'$ /ml Dexamethasone;Moist Heat;Ultrasound;Contrast Bath;DME Instruction;Gait training;Stair training;Functional mobility training;Therapeutic activities;Therapeutic exercise;Balance training;Neuromuscular re-education;Patient/family education;Manual techniques;Wheelchair mobility training;Energy conservation;Passive range of motion;Traction;Orthotic Fit/Training;Dry needling;Vestibular;Visual/perceptual remediation/compensation;Taping;Canalith Repostioning;Cognitive remediation    PT Next Visit Plan Progress static and dynamic balance and continue with caregiver/patient ed with low back stretching    PT Home Exercise Plan None set up at evaluation, still working on previous HEP from 6 months prior (STS, daily walking); 8/29: issued STS with focus on large amplitude movement at support surface; 9/12: PT provides handout with sit to stand exercise and large amplitude seated trunk rotation exercise.  9/19: reinforced HEP, provided handout for seated postural exercise with UE abduction, and seated exercise with trunk twists and UE reach, 10x for each, can be performed daily or minimum of 4x/week. No changes today.    Consulted and Agree with Plan of Care Patient;Family member/caregiver    Family Member Consulted caregiver [name]             Patient will benefit from skilled therapeutic intervention in order to improve the following deficits and impairments:  Abnormal gait, Decreased balance, Decreased coordination, Decreased mobility, Postural  dysfunction, Decreased strength, Decreased safety awareness, Improper body mechanics, Impaired flexibility, Decreased activity tolerance, Decreased endurance, Decreased knowledge of precautions, Difficulty walking, Pain, Cardiopulmonary status limiting activity, Decreased range of motion, Impaired perceived functional ability, Decreased cognition, Dizziness  Visit Diagnosis: Muscle weakness (generalized)  Difficulty in walking, not elsewhere classified  Other lack of coordination  Unsteadiness on feet  Parkinson's disease ([place])     Problem List Patient Active Problem List   Diagnosis Date Noted   Sinus drainage 12/09/2020   Medicare annual wellness visit, subsequent 11/18/2020   Pacemaker    Dementia associated with Parkinson's disease ([place]) 06/17/2020   Vertigo 02/19/2020   Urine abnormality 02/19/2020   Dysphagia 02/23/2019   Fall at home 10/18/2017   REM behavioral disorder 11/02/2016   Radicular pain in right arm 10/09/2016   GERD (gastroesophageal reflux disease) 07/31/2016   Colon cancer screening 07/23/2015   Gout 02/18/2015   Depression 01/06/2015   Skin lesion 10/02/2014   Dupuytren's contracture 08/20/2014   Cough 07/21/2014   Lumbar stenosis with neurogenic claudication 06/13/2014   S/P deep brain stimulator placement 05/08/2014   Advance care planning 01/19/2014   Parkinson's disease ([place]) 12/13/2013   PTSD (post-traumatic stress disorder) 06/13/2013   Erectile dysfunction 06/13/2013   HLD (hyperlipidemia) 06/13/2013   Essential hypertension 06/03/2013   Bradycardia by electrocardiogram 06/03/2013   Obstructive sleep apnea 03/12/2013    Zollie Pee, PT 09/20/2021, 1:16 PM  [place] MAIN [facility] SERVICES [address] [place], [place], [address] Phone: [phone]   Fax:  [phone]  Name: [name] MRN: [id number] Date of Birth: Sep 14, 1950

## 2021-09-22 ENCOUNTER — Ambulatory Visit: Payer: Medicare PPO | Admitting: Physical Therapy

## 2021-09-22 ENCOUNTER — Encounter: Payer: Medicare PPO | Admitting: Occupational Therapy

## 2021-09-24 DIAGNOSIS — R3129 Other microscopic hematuria: Secondary | ICD-10-CM | POA: Diagnosis not present

## 2021-09-24 DIAGNOSIS — R569 Unspecified convulsions: Secondary | ICD-10-CM | POA: Diagnosis not present

## 2021-09-24 DIAGNOSIS — R404 Transient alteration of awareness: Secondary | ICD-10-CM | POA: Diagnosis not present

## 2021-09-24 DIAGNOSIS — G3183 Dementia with Lewy bodies: Secondary | ICD-10-CM | POA: Diagnosis not present

## 2021-09-24 DIAGNOSIS — G40909 Epilepsy, unspecified, not intractable, without status epilepticus: Secondary | ICD-10-CM | POA: Diagnosis not present

## 2021-09-24 DIAGNOSIS — R443 Hallucinations, unspecified: Secondary | ICD-10-CM | POA: Diagnosis not present

## 2021-09-24 DIAGNOSIS — R400 Somnolence: Secondary | ICD-10-CM | POA: Diagnosis not present

## 2021-09-24 DIAGNOSIS — G20 Parkinson's disease: Secondary | ICD-10-CM | POA: Diagnosis not present

## 2021-09-24 DIAGNOSIS — G2409 Other drug induced dystonia: Secondary | ICD-10-CM | POA: Diagnosis not present

## 2021-09-24 DIAGNOSIS — F0280 Dementia in other diseases classified elsewhere without behavioral disturbance: Secondary | ICD-10-CM | POA: Diagnosis not present

## 2021-09-24 DIAGNOSIS — F32A Depression, unspecified: Secondary | ICD-10-CM | POA: Diagnosis not present

## 2021-09-24 DIAGNOSIS — R4182 Altered mental status, unspecified: Secondary | ICD-10-CM | POA: Diagnosis not present

## 2021-09-24 DIAGNOSIS — T43595A Adverse effect of other antipsychotics and neuroleptics, initial encounter: Secondary | ICD-10-CM | POA: Diagnosis not present

## 2021-09-24 DIAGNOSIS — G9340 Encephalopathy, unspecified: Secondary | ICD-10-CM | POA: Diagnosis not present

## 2021-09-24 DIAGNOSIS — I6621 Occlusion and stenosis of right posterior cerebral artery: Secondary | ICD-10-CM | POA: Diagnosis not present

## 2021-09-24 DIAGNOSIS — F0282 Dementia in other diseases classified elsewhere, unspecified severity, with psychotic disturbance: Secondary | ICD-10-CM | POA: Diagnosis not present

## 2021-09-27 ENCOUNTER — Ambulatory Visit: Payer: Medicare PPO | Admitting: Physical Therapy

## 2021-09-27 ENCOUNTER — Encounter: Payer: Medicare PPO | Admitting: Occupational Therapy

## 2021-09-29 ENCOUNTER — Ambulatory Visit: Payer: Medicare PPO | Admitting: Physical Therapy

## 2021-09-29 ENCOUNTER — Encounter: Payer: Medicare PPO | Admitting: Occupational Therapy

## 2021-09-30 ENCOUNTER — Ambulatory Visit: Payer: Medicare PPO | Admitting: Physical Therapy

## 2021-09-30 DIAGNOSIS — G20 Parkinson's disease: Secondary | ICD-10-CM | POA: Diagnosis not present

## 2021-09-30 DIAGNOSIS — G9340 Encephalopathy, unspecified: Secondary | ICD-10-CM | POA: Diagnosis not present

## 2021-10-04 ENCOUNTER — Encounter: Payer: Medicare PPO | Admitting: Occupational Therapy

## 2021-10-04 ENCOUNTER — Ambulatory Visit: Payer: Medicare PPO | Admitting: Physical Therapy

## 2021-10-04 DIAGNOSIS — R278 Other lack of coordination: Secondary | ICD-10-CM

## 2021-10-04 DIAGNOSIS — R296 Repeated falls: Secondary | ICD-10-CM

## 2021-10-04 DIAGNOSIS — M6281 Muscle weakness (generalized): Secondary | ICD-10-CM | POA: Diagnosis not present

## 2021-10-04 DIAGNOSIS — G20 Parkinson's disease: Secondary | ICD-10-CM | POA: Diagnosis not present

## 2021-10-04 DIAGNOSIS — R2681 Unsteadiness on feet: Secondary | ICD-10-CM | POA: Diagnosis not present

## 2021-10-04 DIAGNOSIS — R262 Difficulty in walking, not elsewhere classified: Secondary | ICD-10-CM

## 2021-10-04 NOTE — Therapy (Signed)
[place] MAIN [facility] SERVICES [address] [place], [place], [address] Phone: [phone]   Fax:  [phone]  Physical Therapy Treatment  Patient Details  Name: [name] MRN: [id number] Date of Birth: 1950-06-11 Referring Provider (PT): Elsie Stain, MD   Encounter Date: 10/04/2021   PT End of Session - 10/04/21 0956     Visit Number 74    Number of Visits 60    Date for PT Re-Evaluation 10/04/21    Authorization Type [employer] Medicare Choice PPO    Progress Note Due on Visit 43    PT Start Time 1102    PT Stop Time 1141    PT Time Calculation (min) 39 min    Equipment Utilized During Treatment Gait belt    Activity Tolerance Patient tolerated treatment well    Behavior During Therapy WFL for tasks assessed/performed             Past Medical History:  Diagnosis Date   Arthritis    Bradycardia    Cancer ([place]) 2013   skin cancer   Depression    ptsd   Dysrhythmia    chronic slow heart rate   GERD (gastroesophageal reflux disease)    Headache(784.0)    tension headaches non recent   History of chicken pox    History of kidney stones    passed   Hypertension    treated with HCTZ   Pacemaker    Parkinson's disease ([place])    dx'ed 15 years ago   PTSD (post-traumatic stress disorder)    Shortness of breath dyspnea    Sleep apnea    doesn't use C-pap   Varicose veins     Past Surgical History:  Procedure Laterality Date   CHOLECYSTECTOMY N/A 10/22/2014   Procedure: LAPAROSCOPIC CHOLECYSTECTOMY WITH INTRAOPERATIVE CHOLANGIOGRAM;  Surgeon: Dia Crawford III, MD;  Location: [facility] ORS;  Service: General;  Laterality: N/A;   cyst removed      from lip as a child   INTRAMEDULLARY (IM) NAIL INTERTROCHANTERIC N/A 02/18/2019   Procedure: INTRAMEDULLARY (IM) NAIL INTERTROCHANTRIC, RIGHT,;  Surgeon: Thornton Park, MD;  Location: [facility] ORS;  Service: Orthopedics;  Laterality: N/A;   LUMBAR LAMINECTOMY/DECOMPRESSION  MICRODISCECTOMY Bilateral 12/14/2012   Procedure: Bilateral lumbar three-four, four-five decompressive laminotomy/foraminotomy;  Surgeon: Charlie Pitter, MD;  Location: [place] [facility] ORS;  Service: Neurosurgery;  Laterality: Bilateral;   PULSE GENERATOR IMPLANT Bilateral 12/13/2013   Procedure: Bilateral implantable pulse generator placement;  Surgeon: Erline Levine, MD;  Location: [place] [facility] ORS;  Service: Neurosurgery;  Laterality: Bilateral;  Bilateral implantable pulse generator placement   skin cancer removed     from ears,   12 lft arm  rt leg 15   SUBTHALAMIC STIMULATOR BATTERY REPLACEMENT Bilateral 07/14/2017   Procedure: BILATERAL IMPLANTED PULSE GENERATOR CHANGE FOR DEEP BRAIN STIMULATOR;  Surgeon: Erline Levine, MD;  Location: [place];  Service: Neurosurgery;  Laterality: Bilateral;   SUBTHALAMIC STIMULATOR INSERTION Bilateral 12/06/2013   Procedure: SUBTHALAMIC STIMULATOR INSERTION;  Surgeon: Erline Levine, MD;  Location: [place] [facility] ORS;  Service: Neurosurgery;  Laterality: Bilateral;  Bilateral deep brain stimulator placement    There were no vitals filed for this visit.   Subjective Assessment - 10/04/21 0955     Subjective Pt is recovering from recent altered consciousness requiring hospitilization. Pt has ipmroved but his caregiver still feels he is not back to normal and they both ( pt and caregiver) report he is weak following this hospitilization. Pt is receiving  a u step walker for around the home and a new up walker for community ambulaiton.    Patient is accompained by: Family member    Pertinent History [name] is a 57yoM who comes to [facility] neuro for evaluation of conitnued difficulty of imbalance, unsteadiness on feet, and generally limited tolerance to mobility in the setting of chronic parkinsonism. SInce DC in February, pt has had PPM placement due to bradycardia issues. Pt also started using an UpWalker device 2 week prior to this evaluation, goal was to help with gradual melting  of postural extension that occured with sustained upright actiivty. Pt has continued to practice supervised AMB with caregivers, short distances out of house and in community. Pt continues to experience frequent falls on a weekly basis, at a rate that both he and wife report to be increasing in frequency. PMH: HTN, bradycardia s/p PPM, PD, DBS, COVID-19 infection, chronic low back pain s/p L4/5 surgery.    Limitations Walking;Standing;Sitting;House hold activities;Lifting    How long can you sit comfortably? Not limited    How long can you stand comfortably? 5 minutes with support    How long can you walk comfortably? ~5-6 minutes UpWalker Walker    Patient Stated Goals Reduce falls, improve AMB tolerance    Pain Onset More than a month ago    Pain Onset More than a month ago             Exercise/Activity Sets/Reps/Time/ Resistance Assistance Charge type Comments- Unless otherwise stated, CGA was provided and gait belt donned in order to ensure pt safety         Seated on dynadisc   Marching and step over hurdle- increased difficulty with R LE copared to L  2*10 ea w/ 5# AW  NMR  Increased dificulty on R LE, cues for upright posture   Standing large side step to airex pad   x 10 ea x 3 # AW   NMR  Increased difficulty this session secondary to prolonged hospitalization, pt also reports generally feeling weaker compared to previous sessions.   Standing large anterior step to large (16 inch) step   * 10   therex Cues to improve foot clearance, previously using weight but did not today seconary to weakness and increased need for UE support.   PWR! Up from sit to stand  X 10   therex To improve LE strength and STS efficacy and safety, cues for UE PRW! Movement patterns                                       Treatment Provided this session   Pt educated throughout session about proper posture and technique with exercises. Improved exercise technique, movement at target joints, use of target  muscles after min to mod verbal, visual, tactile cues.  Note: Portions of this document were prepared using Dragon voice recognition software and although reviewed may contain unintentional dictation errors in syntax, grammar, or spelling.                               PT Education - 10/04/21 0956     Education provided Yes    Education Details exercise technique    Person(s) Educated Patient    Methods Explanation    Comprehension Verbalized understanding  PT Short Term Goals - 07/12/21 1107       PT SHORT TERM GOAL #1   Title Pt to demonstrate improved sustained AMB tolerance >884f.    Baseline At evlauation: 6073ftolerance (not to failure); 9/26: 1384 ft    Time 4    Period Weeks    Status Achieved    Target Date 01/05/21      PT SHORT TERM GOAL #2   Title Pt will improve BERG balance score to greater than 45 to indicate decrease risk of falls.    Baseline 2/13:44    Time 4    Period Weeks    Status Achieved    Target Date 06/28/21      PT SHORT TERM GOAL #3   Title Pt to demonstrate 10MWT<13sec to show self-selected gait speed appropriate for AMB outside of house and to improve energy efficiency.    Baseline UpWalker Eval: 17.53sec; 9/26: 0.74 m/s (13.5)11/21: 13.68 (.73 m/s) 12.64 on 12/28 9.88 pn2/13    Time 4    Period Weeks    Status Achieved    Target Date 02/08/21               PT Long Term Goals - 08/23/21 1131       PT LONG TERM GOAL #1   Title Pt will improve BERG balance score to greater than 47 to indicate decreaed risk of falls.    Baseline 41 on 12/28 44 on 2/13 45 on 3/27 5/5: 46    Time 8    Period Weeks    Status On-going    Target Date 10/04/21      PT LONG TERM GOAL #2   Title Pt to tolerate 100061fustained AMB with UpWalker without rest interval to improve safety and independence in limited community AMB.    Baseline At eval; tired after 600f82f/26: 1384 ft    Time 8    Period Weeks     Status Achieved    Target Date 10/04/21      PT LONG TERM GOAL #3   Title Pt to demonstrate improved FOTO score by 5 points compared to initital assessment.    Baseline 9/26: 49 (eval was 50) 911/21: 52 12/28 deferred as wife not present and pt not best historian 2/13: 43 387:55    Time 8    Period Weeks    Status Achieved    Target Date 07/26/21      PT LONG TERM GOAL #4   Title Pt to show tolerance of AMB>1300ft97fh UpWalker to improve tolerance to accessing the community for IADL.    Baseline Eval: 600ft;7f26: 1385    Time 12    Period Weeks    Status Achieved      PT LONG TERM GOAL #5   Title Patient will increase six minute walk test distance to >1000 for progression to community ambulator and improve gait ability    Baseline 9/26: new 11/21: 850 feet with up Walker 12/28: 925 feet with up walker, 980 feet with up walker, 07/12/21: 790 feet with up walker 5/8: 1160 feet    Time 8    Period Weeks    Status Achieved    Target Date 07/26/21      PT LONG TERM GOAL #6   Title Patient will experience 1 fall or less per week for at least 3 weeks in order to improve his frequency of falls and prevent injury from falls  Baseline On average multiple falls per week.  07/12/2021, patient had 4 falls in the last 3 weeks 5/8: 1 fall in past 3 weeks    Time 12    Period Weeks    Status Achieved    Target Date 08/23/21                   Plan - 10/04/21 0957     Clinical Impression Statement p is pleasant and presents with good motivation for completion of PT activities. Pt recovering from hospitilization from altered consciousness. Pt continued with LE strength, balance and mobility tasks with good response. Pt did regress with some strength and balance interventions secondary to prolonged hospitilization an dlack of exercises / activity over the past several weeks. Pt will continue to benfit from skilled PT intervnention to iomprove his strength, balance, prevent falls,  and improve QOL and caregiver burden.    Personal Factors and Comorbidities Age;Past/Current Experience;Time since onset of injury/illness/exacerbation;Transportation    Comorbidities Chronic back pain, orthostatic hypotension, repeated falls, parkinsonism    Examination-Activity Limitations Bathing;Bed Mobility;Caring for Others;Bend;Dressing;Lift;Locomotion Level;Reach Overhead;Sit;Squat;Stairs;Stand;Toileting;Transfers;Carry    Examination-Participation Restrictions Cleaning;Community Activity;Interpersonal Relationship;Shop;Volunteer;Yard Work;Other;Driving    Stability/Clinical Decision Making Evolving/Moderate complexity    Rehab Potential Fair    PT Frequency 2x / week    PT Duration 12 weeks    PT Treatment/Interventions ADLs/Self Care Home Management;Aquatic Therapy;Cryotherapy;Electrical Stimulation;Iontophoresis '4mg'$ /ml Dexamethasone;Moist Heat;Ultrasound;Contrast Bath;DME Instruction;Gait training;Stair training;Functional mobility training;Therapeutic activities;Therapeutic exercise;Balance training;Neuromuscular re-education;Patient/family education;Manual techniques;Wheelchair mobility training;Energy conservation;Passive range of motion;Traction;Orthotic Fit/Training;Dry needling;Vestibular;Visual/perceptual remediation/compensation;Taping;Canalith Repostioning;Cognitive remediation    PT Next Visit Plan Progress static and dynamic balance and continue with caregiver/patient ed with low back stretching    PT Home Exercise Plan None set up at evaluation, still working on previous HEP from 6 months prior (STS, daily walking); 8/29: issued STS with focus on large amplitude movement at support surface; 9/12: PT provides handout with sit to stand exercise and large amplitude seated trunk rotation exercise.  9/19: reinforced HEP, provided handout for seated postural exercise with UE abduction, and seated exercise with trunk twists and UE reach, 10x for each, can be performed daily or minimum of  4x/week. No changes today.    Consulted and Agree with Plan of Care Patient;Family member/caregiver    Family Member Consulted caregiver [name]             Patient will benefit from skilled therapeutic intervention in order to improve the following deficits and impairments:  Abnormal gait, Decreased balance, Decreased coordination, Decreased mobility, Postural dysfunction, Decreased strength, Decreased safety awareness, Improper body mechanics, Impaired flexibility, Decreased activity tolerance, Decreased endurance, Decreased knowledge of precautions, Difficulty walking, Pain, Cardiopulmonary status limiting activity, Decreased range of motion, Impaired perceived functional ability, Decreased cognition, Dizziness  Visit Diagnosis: Difficulty in walking, not elsewhere classified  Other lack of coordination  Unsteadiness on feet  Repeated falls     Problem List Patient Active Problem List   Diagnosis Date Noted   Sinus drainage 12/09/2020   Medicare annual wellness visit, subsequent 11/18/2020   Pacemaker    Dementia associated with Parkinson's disease ([place]) 06/17/2020   Vertigo 02/19/2020   Urine abnormality 02/19/2020   Dysphagia 02/23/2019   Fall at home 10/18/2017   REM behavioral disorder 11/02/2016   Radicular pain in right arm 10/09/2016   GERD (gastroesophageal reflux disease) 07/31/2016   Colon cancer screening 07/23/2015   Gout 02/18/2015   Depression 01/06/2015   Skin lesion 10/02/2014   Dupuytren's contracture 08/20/2014   Cough  07/21/2014   Lumbar stenosis with neurogenic claudication 06/13/2014   S/P deep brain stimulator placement 05/08/2014   Advance care planning 01/19/2014   Parkinson's disease ([place]) 12/13/2013   PTSD (post-traumatic stress disorder) 06/13/2013   Erectile dysfunction 06/13/2013   HLD (hyperlipidemia) 06/13/2013   Essential hypertension 06/03/2013   Bradycardia by electrocardiogram 06/03/2013   Obstructive sleep apnea 03/12/2013     Particia Lather, PT 10/04/2021, 1:51 PM  [place] MAIN [facility] SERVICES [address] [place], [place], [address] Phone: [phone]   Fax:  [phone]  Name: [name] MRN: [id number] Date of Birth: Jun 05, 1950

## 2021-10-05 ENCOUNTER — Ambulatory Visit (INDEPENDENT_AMBULATORY_CARE_PROVIDER_SITE_OTHER): Payer: Medicare PPO | Admitting: Psychology

## 2021-10-05 DIAGNOSIS — F331 Major depressive disorder, recurrent, moderate: Secondary | ICD-10-CM

## 2021-10-05 DIAGNOSIS — F4323 Adjustment disorder with mixed anxiety and depressed mood: Secondary | ICD-10-CM

## 2021-10-06 ENCOUNTER — Encounter: Payer: Medicare PPO | Admitting: Occupational Therapy

## 2021-10-06 ENCOUNTER — Ambulatory Visit: Payer: Medicare PPO | Admitting: Physical Therapy

## 2021-10-06 NOTE — Progress Notes (Signed)
[place] Counselor/Therapist Progress Note  Patient ID: [name], MRN: [id number],    Date: 10/05/2021  Time Spent: 35 minutes  Treatment Type: Indivdual  Reported Symptoms: sadness, and boredom  Mental Status Exam: Appearance:  Casual     Behavior: Drowsy  Motor: Psychomotor Retardation  Speech/Language:  Slow  Affect: Blunt  Mood: normal  Thought process: concrete  Thought content:   WNL  Sensory/Perceptual disturbances:   WNL  Orientation: oriented to person, place, time/date, and situation  Attention: Fair  Concentration: Fair  Memory: Immediate;   [place] of knowledge:  Fair  Insight:   Fair  Judgment:  Fair  Impulse Control: Fair   Risk Assessment: Danger to Self:  No Self-injurious Behavior: No Danger to Others: No Duty to Warn:no Physical Aggression / Violence:No  Access to Firearms a concern: No  Gang Involvement:No   Subjective: The patient attended a face-to-face individual therapy session via video visit today.  The patient gave verbal consent for the video to be on WebEx.  The patient was in his home  and therapist was in the office.  The patient presents with a flat affect and mood is pleasant.  The patient's wife reports that the patient was in the hospital for a few days because he was sleeping too much.  They ended up adjusting some of his medication but he still struggling with sleeping too much at this point.  The patient seems to be having some more problems with his Parkinson's.  The patient and his wife need a good bit of support because they are dealing with his health condition and struggling with this.  We will continue to provide supportive therapy as needed.   Interventions: Ego-Supportive, CBT  Diagnosis:Major depressive disorder, recurrent episode, moderate (HCC)  Adjustment disorder with mixed anxiety and depressed mood  Plan: Client Abilities/Strengths  Pt is bright and has a good sense of humor.  Client  Treatment Preferences  Individual therapy.  Client Statement of Needs  improve coping skills regarding chronic illness.  Treatment Level  Outpatient Individual therapy  Symptoms  Depressed or irritable mood.: No Description Entered (Status: improved). Diminished interest in or  enjoyment of activities.: No Description Entered (Status: maintained). Feelings of hopelessness,  worthlessness, or inappropriate guilt.: No Description Entered (Status: improved). Lack of energy.: No  Description Entered (Status: maintained). Psychomotor agitation or retardation.: No Description  Entered (Status: maintained). Social withdrawal.: No Description Entered (Status: improved).  Problems Addressed  Unipolar Depression, Unipolar Depression, Unipolar Depression, Unipolar Depression  Goals 1. Alleviate depressive symptoms and return to previous level of effective  functioning. 2. Develop healthy interpersonal relationships that lead to the alleviation  and help prevent the relapse of depression. Objective Describe current and past experiences with depression including their impact on functioning and  attempts to resolve it. Target Date: 2022-04-30 Frequency: Monthly Progress: 60 Modality: individual Related Interventions 1. Encourage the client to share his/her thoughts and feelings of depression; express empathy and  build rapport while identifying primary cognitive, behavioral, interpersonal, or other  contributors to depression. Objective Learn and implement behavioral strategies to overcome depression. Target Date: 2022-04-30 Frequency: Monthly Progress: 80 Modality: individual Objective Identify and replace thoughts and beliefs that support depression. Target Date: 2022-04-30 Frequency: Monthly Progress: 70 Modality: individual Related Interventions 1. Conduct Cognitive-Behavioral Therapy (see Cognitive Behavior Therapy by Olevia Bowens; Overcoming Depression by Lynita Lombard al.), beginning with helping  the client learn the connection among  cognition, depressive feelings, and actions. 2. Explore and  restructure underlying assumptions and beliefs reflected in biased self-talk that  may put the client at risk for relapse or recurrence. 3. Facilitate and reinforce the client's shift from biased depressive self-talk and beliefs to realitybased cognitive messages that enhance self-confidence and increase adaptive actions (see  "Positive Self-Talk" in the Adult Psychotherapy Homework Planner by Bryn Gulling). Objective Learn and implement problem-solving and decision-making skills. Target Date: 2022-04-30 Frequency: Monthly Progress: 70 Modality: individual Related Interventions 1. Encourage in the client the development of a positive problem orientation in which problems  and solving them are viewed as a natural part of life and not something to be feared, despaired,  or avoided. 2. Conduct Problem-Solving Therapy (see Problem-Solving Therapy by Shawnee Knapp and Delane Ginger) using techniques such as psychoeducation, modeling, and role-playing to teach client problem-solving skills (i.e., defining a problem specifically, generating possible solutions, evaluating the pros  and cons of each solution, selecting and implementing a plan of action, evaluating the efficacy  of the plan, accepting or revising the plan); role-play application of the problem-solving skill to  a real life issue (or assign "Applying Problem-Solving to Interpersonal Conflict" in the Adult  Psychotherapy Homework Planner by Bryn Gulling). 3. Develop healthy thinking patterns and beliefs about self, others, and the world that lead to the alleviation and help prevent the relapse of  depression. 4. Recognize, accept, and cope with feelings of depression. Diagnosis Axis  none F43.21 (Adjustment Disorder, With depressed mood) - Open -  [Signifier: n/a]  Adjustment Disorder,  With Depressed Mood  Axis  none 296.32 (Major depressive affective  disorder, recurrent episode,  moderate) - Open - [Signifier: n/a]  Conditions For Discharge Achievement of treatment goals and objectives   Plan is to continue to offer supportive therapy to patient to help him deal with his depression related to his Parkinsons diagnosis.  Patient approved Treatment Plan   Vaughn Beaumier G Britian Jentz, LCSW                  Merrill Deanda G Pavle Wiler, LCSW               Timya Trimmer G Matia Zelada, LCSW               Mandi Mattioli [employer], LCSW               Nicole Hafley [employer], LCSW               Brihana Quickel [employer], LCSW               Liana Camerer G Arabella Revelle, LCSW               Alesi Zachery [employer], LCSW

## 2021-10-07 ENCOUNTER — Ambulatory Visit (INDEPENDENT_AMBULATORY_CARE_PROVIDER_SITE_OTHER): Payer: Medicare PPO | Admitting: Family Medicine

## 2021-10-07 ENCOUNTER — Encounter: Payer: Self-pay | Admitting: Family Medicine

## 2021-10-07 VITALS — BP 134/80 | HR 66 | Temp 98.2°F | Ht 69.0 in | Wt 180.0 lb

## 2021-10-07 DIAGNOSIS — G20 Parkinson's disease: Secondary | ICD-10-CM | POA: Diagnosis not present

## 2021-10-07 DIAGNOSIS — R319 Hematuria, unspecified: Secondary | ICD-10-CM | POA: Diagnosis not present

## 2021-10-07 MED ORDER — NYSTATIN POWD: Status: AC

## 2021-10-07 MED ORDER — FLUTICASONE PROPIONATE 50 MCG/ACT NA SUSP: 2.0000 | Freq: Every day | NASAL | 1 refills | Status: AC

## 2021-10-07 NOTE — Patient Instructions (Signed)
If your urine is abnormal then you may need a kidney ultrasound and urology evaluation.  Let me get your results and we'll form there.   Try the higher dose of flonase in the meantime.  Take care.  Glad to see you.

## 2021-10-07 NOTE — Progress Notes (Unsigned)
He is awaiting a few walker and USTEP from the [place].    He tolerated flonase, rx sent.  D/w pt about using flonase 2 sprays BID temporarily if needed and not using antihistamine.    He was admitted to the [place] then transferred to inpatient facility in [place].  Admitted with AMS.  This was after taking nuplazid.  He was lethargic at the time, having more trouble eating.  He had falls prior to the med change.  He had more pronounced sx after 2nd dose of nuplazid, he was less responsive.  He wasn't in respiratory distress.  No known sz activity noted.     ===================================== Since the last visit, he took the nuplazid for two nights and became progressively more lethargic. The caregiver notes that he was able to grab the bar to get him up. They tried to get him up but he was unable to be roused. He woke briefly in response to sternal rub but then became lethargic again. EMS was called. Once he on the stretcher he woke up more and talking. He was brought to the [place] and [facility] I reviewed that note:  This 71 y.o. male with a history of Parkinson's Disease w/ DBS in place, PD-related dementia, OSA, depression, and seizure disorder who presented for EEG given concern for seizures. Patient presented to [facility]24/[facility] w/ cc of increased somnolence. Patient started new anti-psychotic medication, Nuplazid (pimavanserin) for management of hallucinations. By afternoon of 6/8, he was less responsive. Patient brought to the emergency department where he was noted to have intermittent periods of unresponsiveness with GCS as low as 5. Patient felt to be consistently protecting airway. No obvious metabolic derangement or infectious etiology identified. CTA head/neck showed the following: Severe narrowing/near occlusion of the proximal right PCA P2 segment... No severe stenosis identified in the neck. Patient evaluated in consultation by neurology who recommended continuous EEG.   Altered  mental status Concern seizure activity Periods of unresponsiveness on the setting of new medication--Nuplazid (pimavanserin) recently started. No events of note on continuous EEG. This was held and he returned to his baseline mental status. Other workup including CT head, CTA h/n without obvious cause for periodic unresponsiveness. Nuplazid was discontinued.  Wife reports that he remained altered until early the next week. Urinalysis was notable for hematuria but no infection. Wife feels he is lost his sense of empathy and kindness.  =====================================    He had MRI/MRA pending.     D/w pt about hematuria w/u.     Meds, vitals, and allergies reviewed.   ROS: Per HPI unless specifically indicated in ROS section

## 2021-10-08 LAB — URINALYSIS, ROUTINE W REFLEX MICROSCOPIC
Bilirubin Urine: NEGATIVE
Ketones, ur: NEGATIVE
Leukocytes,Ua: NEGATIVE
Nitrite: NEGATIVE
Specific Gravity, Urine: 1.015 (ref 1.000–1.030)
Total Protein, Urine: NEGATIVE
Urine Glucose: NEGATIVE
Urobilinogen, UA: 0.2 (ref 0.0–1.0)
pH: 6.5 (ref 5.0–8.0)

## 2021-10-09 LAB — URINE CULTURE
MICRO NUMBER:: 13559609
SPECIMEN QUALITY:: ADEQUATE

## 2021-10-10 ENCOUNTER — Encounter: Payer: Self-pay | Admitting: Family Medicine

## 2021-10-10 ENCOUNTER — Other Ambulatory Visit: Payer: Self-pay | Admitting: Family Medicine

## 2021-10-10 DIAGNOSIS — R319 Hematuria, unspecified: Secondary | ICD-10-CM | POA: Insufficient documentation

## 2021-10-10 MED ORDER — CEPHALEXIN 500 MG PO CAPS: 500.0000 mg | ORAL_CAPSULE | Freq: Three times a day (TID) | ORAL | 0 refills | Status: DC

## 2021-10-10 NOTE — Assessment & Plan Note (Addendum)
See notes on labs.  Given urine culture positive would defer referral to urology and renal ultrasound at this point.

## 2021-10-11 ENCOUNTER — Ambulatory Visit: Payer: Medicare PPO

## 2021-10-11 ENCOUNTER — Encounter: Payer: Medicare PPO | Admitting: Occupational Therapy

## 2021-10-13 ENCOUNTER — Ambulatory Visit: Payer: Medicare PPO | Admitting: Physical Therapy

## 2021-10-15 ENCOUNTER — Telehealth: Payer: Self-pay

## 2021-10-15 NOTE — Telephone Encounter (Signed)
Patient's wife is calling in wanting to let Dr.Duncan know that [name] is out of the hospital, and is finally able to start him on the antibiotics. [name] states that they did recommend a living facility but she does not to proceed with this as she is not comfortable with it.

## 2021-10-15 NOTE — Telephone Encounter (Signed)
Noted. Thanks. Please see if you can get a discharge summary from inpatient stay.

## 2021-10-18 ENCOUNTER — Ambulatory Visit: Payer: Medicare PPO | Admitting: Physical Therapy

## 2021-10-20 ENCOUNTER — Ambulatory Visit: Payer: Medicare PPO | Admitting: Physical Therapy

## 2021-10-20 NOTE — Telephone Encounter (Signed)
Records request has been sent to get discharge summary.

## 2021-10-25 ENCOUNTER — Ambulatory Visit: Payer: Medicare PPO | Admitting: Physical Therapy

## 2021-10-26 ENCOUNTER — Ambulatory Visit (INDEPENDENT_AMBULATORY_CARE_PROVIDER_SITE_OTHER): Payer: Medicare PPO | Admitting: Psychology

## 2021-10-26 DIAGNOSIS — F331 Major depressive disorder, recurrent, moderate: Secondary | ICD-10-CM | POA: Diagnosis not present

## 2021-10-26 DIAGNOSIS — F4323 Adjustment disorder with mixed anxiety and depressed mood: Secondary | ICD-10-CM

## 2021-10-26 NOTE — Progress Notes (Signed)
[facility] [place] Counselor/Therapist Progress Note  Patient ID: [name], MRN: [id number],    Date: 10/26/2021  Time Spent: 35 minutes  Treatment Type: Indivdual  Reported Symptoms: sadness, and boredom  Mental Status Exam: Appearance:  Casual     Behavior: Drowsy  Motor: Psychomotor Retardation  Speech/Language:  Slow  Affect: Blunt  Mood: normal  Thought process: concrete  Thought content:   WNL  Sensory/Perceptual disturbances:   WNL  Orientation: oriented to person, place, time/date, and situation  Attention: Fair  Concentration: Fair  Memory: Immediate;   [place] of knowledge:  Fair  Insight:   Fair  Judgment:  Fair  Impulse Control: Fair   Risk Assessment: Danger to Self:  No Self-injurious Behavior: No Danger to Others: No Duty to Warn:no Physical Aggression / Violence:No  Access to Firearms a concern: No  Gang Involvement:No   Subjective: The patient attended a face-to-face individual therapy session via video visit today.  The patient gave verbal consent for the video to be on WebEx.  The patient was in his home  and therapist was in the office.  The patient presents with a flat affect and mood is pleasant.  The patient's wife reports that the patient has been in the hospital again twice since I last saw him.  She states that things seem to be getting worse and he is having more hallucinations.  They want to continue to do therapy to provide support for him as he moves through this process of Parkinson's.  Provided supportive therapy but patient is having a difficult time attending to conversations.  We will continue to provide supportive therapy for patient and his wife.   Interventions: Ego-Supportive, CBT  Diagnosis:No diagnosis found.  Plan: Client Abilities/Strengths  Pt is bright and has a good sense of humor.  Client Treatment Preferences  Individual therapy.  Client Statement of Needs  improve coping skills regarding chronic  illness.  Treatment Level  Outpatient Individual therapy  Symptoms  Depressed or irritable mood.: No Description Entered (Status: improved). Diminished interest in or  enjoyment of activities.: No Description Entered (Status: maintained). Feelings of hopelessness,  worthlessness, or inappropriate guilt.: No Description Entered (Status: improved). Lack of energy.: No  Description Entered (Status: maintained). Psychomotor agitation or retardation.: No Description  Entered (Status: maintained). Social withdrawal.: No Description Entered (Status: improved).  Problems Addressed  Unipolar Depression, Unipolar Depression, Unipolar Depression, Unipolar Depression  Goals 1. Alleviate depressive symptoms and return to previous level of effective  functioning. 2. Develop healthy interpersonal relationships that lead to the alleviation  and help prevent the relapse of depression. Objective Describe current and past experiences with depression including their impact on functioning and  attempts to resolve it. Target Date: 2022-04-30 Frequency: Monthly Progress: 60 Modality: individual Related Interventions 1. Encourage the client to share his/her thoughts and feelings of depression; express empathy and  build rapport while identifying primary cognitive, behavioral, interpersonal, or other  contributors to depression. Objective Learn and implement behavioral strategies to overcome depression. Target Date: 2022-04-30 Frequency: Monthly Progress: 80 Modality: individual Objective Identify and replace thoughts and beliefs that support depression. Target Date: 2022-04-30 Frequency: Monthly Progress: 70 Modality: individual Related Interventions 1. Conduct Cognitive-Behavioral Therapy (see Cognitive Behavior Therapy by Olevia Bowens; Overcoming Depression by Lynita Lombard al.), beginning with helping the client learn the connection among  cognition, depressive feelings, and actions. 2. Explore and restructure  underlying assumptions and beliefs reflected in biased self-talk that  may put the client at risk for  relapse or recurrence. 3. Facilitate and reinforce the client's shift from biased depressive self-talk and beliefs to realitybased cognitive messages that enhance self-confidence and increase adaptive actions (see  "Positive Self-Talk" in the Adult Psychotherapy Homework Planner by Bryn Gulling). Objective Learn and implement problem-solving and decision-making skills. Target Date: 2022-04-30 Frequency: Monthly Progress: 70 Modality: individual Related Interventions 1. Encourage in the client the development of a positive problem orientation in which problems  and solving them are viewed as a natural part of life and not something to be feared, despaired,  or avoided. 2. Conduct Problem-Solving Therapy (see Problem-Solving Therapy by Shawnee Knapp and Delane Ginger) using techniques such as psychoeducation, modeling, and role-playing to teach client problem-solving skills (i.e., defining a problem specifically, generating possible solutions, evaluating the pros  and cons of each solution, selecting and implementing a plan of action, evaluating the efficacy  of the plan, accepting or revising the plan); role-play application of the problem-solving skill to  a real life issue (or assign "Applying Problem-Solving to Interpersonal Conflict" in the Adult  Psychotherapy Homework Planner by Bryn Gulling). 3. Develop healthy thinking patterns and beliefs about self, others, and the world that lead to the alleviation and help prevent the relapse of  depression. 4. Recognize, accept, and cope with feelings of depression. Diagnosis Axis  none F43.21 (Adjustment Disorder, With depressed mood) - Open -  [Signifier: n/a]  Adjustment Disorder,  With Depressed Mood  Axis  none 296.32 (Major depressive affective disorder, recurrent episode,  moderate) - Open - [Signifier: n/a]  Conditions For Discharge Achievement of  treatment goals and objectives   Plan is to continue to offer supportive therapy to patient to help him deal with his depression related to his Parkinsons diagnosis.  Patient approved Treatment Plan   Jaymes Revels G Kylo Gavin, LCSW                  Zamauri Nez G Johnnie Moten, LCSW               Allexa Acoff G Janneth Krasner, LCSW               Didi Ganaway [employer], LCSW               Cana Mignano [employer], LCSW               Ireta Pullman [employer], LCSW               Laketa Sandoz [employer], LCSW               Prentiss Hammett G Othel Dicostanzo, LCSW               Keiaira Donlan Noel, LCSW

## 2021-10-27 ENCOUNTER — Ambulatory Visit: Payer: Medicare PPO | Admitting: Physical Therapy

## 2021-11-01 ENCOUNTER — Ambulatory Visit: Payer: Medicare PPO | Admitting: Physical Therapy

## 2021-11-01 NOTE — Therapy (Deleted)
OUTPATIENT PHYSICAL THERAPY Recert/ TREATMENT NOTE   Patient Name: [name] MRN: [id number] DOB:01/29/1951, 71 y.o., male Today's Date: 11/01/2021  PCP: Tonia Ghent, MD  REFERRING PROVIDER: Tonia Ghent, MD     Past Medical History:  Diagnosis Date   Arthritis    Bradycardia    Cancer [facility]) 2013   skin cancer   Depression    ptsd   Dysrhythmia    chronic slow heart rate   GERD (gastroesophageal reflux disease)    Headache(784.0)    tension headaches non recent   History of chicken pox    History of kidney stones    passed   Hypertension    treated with HCTZ   Pacemaker    Parkinson's disease ([place])    dx'ed 15 years ago   PTSD (post-traumatic stress disorder)    Shortness of breath dyspnea    Sleep apnea    doesn't use C-pap   Varicose veins    Past Surgical History:  Procedure Laterality Date   CHOLECYSTECTOMY N/A 10/22/2014   Procedure: LAPAROSCOPIC CHOLECYSTECTOMY WITH INTRAOPERATIVE CHOLANGIOGRAM;  Surgeon: Dia Crawford III, MD;  Location: [facility] ORS;  Service: General;  Laterality: N/A;   cyst removed      from lip as a child   INTRAMEDULLARY (IM) NAIL INTERTROCHANTERIC N/A 02/18/2019   Procedure: INTRAMEDULLARY (IM) NAIL INTERTROCHANTRIC, RIGHT,;  Surgeon: Thornton Park, MD;  Location: [facility] ORS;  Service: Orthopedics;  Laterality: N/A;   LUMBAR LAMINECTOMY/DECOMPRESSION MICRODISCECTOMY Bilateral 12/14/2012   Procedure: Bilateral lumbar three-four, four-five decompressive laminotomy/foraminotomy;  Surgeon: Charlie Pitter, MD;  Location: [place] NEURO ORS;  Service: Neurosurgery;  Laterality: Bilateral;   PULSE GENERATOR IMPLANT Bilateral 12/13/2013   Procedure: Bilateral implantable pulse generator placement;  Surgeon: Erline Levine, MD;  Location: [place] NEURO ORS;  Service: Neurosurgery;  Laterality: Bilateral;  Bilateral implantable pulse generator placement   skin cancer removed     from ears,   12 lft arm  rt leg 15   SUBTHALAMIC STIMULATOR BATTERY  REPLACEMENT Bilateral 07/14/2017   Procedure: BILATERAL IMPLANTED PULSE GENERATOR CHANGE FOR DEEP BRAIN STIMULATOR;  Surgeon: Erline Levine, MD;  Location: [place];  Service: Neurosurgery;  Laterality: Bilateral;   SUBTHALAMIC STIMULATOR INSERTION Bilateral 12/06/2013   Procedure: SUBTHALAMIC STIMULATOR INSERTION;  Surgeon: Erline Levine, MD;  Location: [place] NEURO ORS;  Service: Neurosurgery;  Laterality: Bilateral;  Bilateral deep brain stimulator placement   Patient Active Problem List   Diagnosis Date Noted   Hematuria 10/10/2021   Sinus drainage 12/09/2020   Medicare annual wellness visit, subsequent 11/18/2020   Pacemaker    Dementia associated with Parkinson's disease ([place]) 06/17/2020   Vertigo 02/19/2020   Urine abnormality 02/19/2020   Dysphagia 02/23/2019   Fall at home 10/18/2017   REM behavioral disorder 11/02/2016   Radicular pain in right arm 10/09/2016   GERD (gastroesophageal reflux disease) 07/31/2016   Colon cancer screening 07/23/2015   Gout 02/18/2015   Depression 01/06/2015   Skin lesion 10/02/2014   Dupuytren's contracture 08/20/2014   Cough 07/21/2014   Lumbar stenosis with neurogenic claudication 06/13/2014   S/P deep brain stimulator placement 05/08/2014   Advance care planning 01/19/2014   Parkinson's disease ([place]) 12/13/2013   PTSD (post-traumatic stress disorder) 06/13/2013   Erectile dysfunction 06/13/2013   HLD (hyperlipidemia) 06/13/2013   Essential hypertension 06/03/2013   Bradycardia by electrocardiogram 06/03/2013   Obstructive sleep apnea 03/12/2013    REFERRING DIAG: G20 (ICD-10-CM) - Parkinson's disease ([place])  THERAPY DIAG:  No diagnosis found.  Rationale for Evaluation and Treatment Rehabilitation  PERTINENT HISTORY: [name] is a 64yoM who comes to [place] neuro for evaluation of conitnued difficulty of imbalance, unsteadiness on feet, and generally limited tolerance to mobility in the setting of chronic parkinsonism. SInce DC in  February, pt has had PPM placement due to bradycardia issues. Pt also started using an UpWalker device 2 week prior to this evaluation, goal was to help with gradual melting of postural extension that occured with sustained upright actiivty. Pt has continued to practice supervised AMB with caregivers, short distances out of house and in community. Pt continues to experience frequent falls on a weekly basis, at a rate that both he and wife report to be increasing in frequency. PMH: HTN, bradycardia s/p PPM, PD, DBS, COVID-19 infection, chronic low back pain s/p L4/5 surgery.   PRECAUTIONS: Fall  SUBJECTIVE: ***  PAIN:  Are you having pain? {OPRCPAIN:27236}     TODAY'S TREATMENT:  Physical therapy treatment session today consisted of completing assessment of goals and administration of testing as demonstrated in flow sheet. Addition treatments may be found below.     PATIENT EDUCATION: Education details: Pt educated throughout session about proper posture and technique with exercises. Improved exercise technique, movement at target joints, use of target muscles after min to mod verbal, visual, tactile cues.  Person educated: {Person educated:[id number]} Education method: Explanation Education comprehension: verbalized understanding   HOME EXERCISE PROGRAM: ***   PT Short Term Goals -       PT SHORT TERM GOAL #1   Title Pt to demonstrate improved sustained AMB tolerance >856f.    Baseline At evlauation: 6033ftolerance (not to failure); 9/26: 1384 ft    Time 4    Period Weeks    Status Achieved    Target Date 01/05/21      PT SHORT TERM GOAL #2   Title Pt will improve BERG balance score to greater than 45 to indicate decrease risk of falls.    Baseline 2/13:44    Time 4    Period Weeks    Status Achieved    Target Date 06/28/21      PT SHORT TERM GOAL #3   Title Pt to demonstrate 10MWT<13sec to show self-selected gait speed appropriate for AMB outside of house and to improve  energy efficiency.    Baseline UpWalker Eval: 17.53sec; 9/26: 0.74 m/s (13.5)11/21: 13.68 (.73 m/s) 12.64 on 12/28 9.88 pn2/13    Time 4    Period Weeks    Status Achieved    Target Date 02/08/21              PT Long Term Goals -        PT LONG TERM GOAL #1   Title Pt will improve BERG balance score to greater than 47 to indicate decreaed risk of falls.    Baseline 41 on 12/28 44 on 2/13 45 on 3/27 5/5: 46    Time 8    Period Weeks    Status On-going    Target Date 10/04/21      PT LONG TERM GOAL #2   Title Pt to tolerate 100073fustained AMB with UpWalker without rest interval to improve safety and independence in limited community AMB.    Baseline At eval; tired after 600f62f/26: 1384 ft    Time 8    Period Weeks    Status Achieved    Target Date 10/04/21      PT LONG TERM GOAL #3  Title Pt to demonstrate improved FOTO score by 5 points compared to initital assessment.    Baseline 9/26: 49 (eval was 50) 911/21: 52 12/28 deferred as wife not present and pt not best historian 2/13: 65 3/27:55    Time 8    Period Weeks    Status Achieved    Target Date 07/26/21      PT LONG TERM GOAL #4   Title Pt to show tolerance of AMB>1324f with UpWalker to improve tolerance to accessing the community for IADL.    Baseline Eval: 6014f  9/26: 1385    Time 12    Period Weeks    Status Achieved      PT LONG TERM GOAL #5   Title Patient will increase six minute walk test distance to >1000 for progression to community ambulator and improve gait ability    Baseline 9/26: new 11/21: 850 feet with up Walker 12/28: 925 feet with up walker, 980 feet with up walker, 07/12/21: 790 feet with up walker 5/8: 1160 feet    Time 8    Period Weeks    Status Achieved    Target Date 07/26/21      PT LONG TERM GOAL #6   Title Patient will experience 1 fall or less per week for at least 3 weeks in order to improve his frequency of falls and prevent injury from falls    Baseline On average  multiple falls per week.  07/12/2021, patient had 4 falls in the last 3 weeks 5/8: 1 fall in past 3 weeks    Time 12    Period Weeks    Status Achieved    Target Date 08/23/21                 ChParticia LatherPT 11/01/2021, 8:28 AM

## 2021-11-03 ENCOUNTER — Ambulatory Visit: Payer: Medicare PPO | Admitting: Physical Therapy

## 2021-11-08 ENCOUNTER — Ambulatory Visit: Payer: Medicare PPO | Admitting: Physical Therapy

## 2021-11-08 DIAGNOSIS — Z9049 Acquired absence of other specified parts of digestive tract: Secondary | ICD-10-CM | POA: Diagnosis not present

## 2021-11-08 DIAGNOSIS — G9340 Encephalopathy, unspecified: Secondary | ICD-10-CM | POA: Diagnosis not present

## 2021-11-08 DIAGNOSIS — R29818 Other symptoms and signs involving the nervous system: Secondary | ICD-10-CM | POA: Diagnosis not present

## 2021-11-08 DIAGNOSIS — R4182 Altered mental status, unspecified: Secondary | ICD-10-CM | POA: Diagnosis not present

## 2021-11-08 DIAGNOSIS — G20 Parkinson's disease: Secondary | ICD-10-CM | POA: Diagnosis not present

## 2021-11-08 DIAGNOSIS — Z9682 Presence of neurostimulator: Secondary | ICD-10-CM | POA: Diagnosis not present

## 2021-11-10 ENCOUNTER — Ambulatory Visit: Payer: Medicare PPO | Admitting: Physical Therapy

## 2021-11-15 ENCOUNTER — Ambulatory Visit: Payer: Medicare PPO

## 2021-11-16 ENCOUNTER — Ambulatory Visit (INDEPENDENT_AMBULATORY_CARE_PROVIDER_SITE_OTHER): Payer: Medicare PPO | Admitting: Psychology

## 2021-11-16 DIAGNOSIS — F331 Major depressive disorder, recurrent, moderate: Secondary | ICD-10-CM | POA: Diagnosis not present

## 2021-11-16 DIAGNOSIS — F4323 Adjustment disorder with mixed anxiety and depressed mood: Secondary | ICD-10-CM

## 2021-11-16 NOTE — Progress Notes (Signed)
[place] Counselor/Therapist Progress Note  Patient ID: ARNOLD KESTER, MRN: [id number],    Date: 11/16/2021  Time Spent: 45 minutes  Treatment Type: Indivdual  Reported Symptoms: sadness, and boredom  Mental Status Exam: Appearance:  Casual     Behavior: Drowsy  Motor: Psychomotor Retardation  Speech/Language:  Slow  Affect: flat  Mood: depressed  Thought process: concrete  Thought content:   WNL  Sensory/Perceptual disturbances:   WNL  Orientation: oriented to person, place, time/date, and situation  Attention: Fair  Concentration: Fair  Memory: Immediate;   [place] of knowledge:  Fair  Insight:   Fair  Judgment:  Fair  Impulse Control: Fair   Risk Assessment: Danger to Self:  No Self-injurious Behavior: No Danger to Others: No Duty to Warn:no Physical Aggression / Violence:No  Access to Firearms a concern: No  Gang Involvement:No   Subjective: The patient attended a face-to-face individual therapy session via video visit today.  The patient gave verbal consent for the video to be on WebEx.  The patient was in his home  and therapist was in the office.  The patient presents with a flat affect and mood is depressed.  The patient states that he feels depressed.  He cannot be real specific about why he feels depressed and I asked him about the possibility that it was because of his health condition.  Worked very did diligently today about trying to get him to look at things a different way in regard to being grateful for what he has now.  We talked about how he has a wife who loves him and is taking care of him also that he has a lot of resources that he can tap into to get what he needs.  The patient generally just has difficulty managing his emotions because he would like to be able to do things he used to do and essentially his life is very sedentary now.  He was able to go fishing with a friend and his children and that seemed to go well for the most  part.  I recommended that he talk to his wife about possibly getting his friend to take him fishing again sometime this summer.  We will continue to offer support and encouragement in therapy.  Interventions: Ego-Supportive, CBT  Diagnosis:Major depressive disorder, recurrent episode, moderate (HCC)  Adjustment disorder with mixed anxiety and depressed mood  Plan: Client Abilities/Strengths  Pt is bright and has a good sense of humor.  Client Treatment Preferences  Individual therapy.  Client Statement of Needs  improve coping skills regarding chronic illness.  Treatment Level  Outpatient Individual therapy  Symptoms  Depressed or irritable mood.: No Description Entered (Status: improved). Diminished interest in or  enjoyment of activities.: No Description Entered (Status: maintained). Feelings of hopelessness,  worthlessness, or inappropriate guilt.: No Description Entered (Status: improved). Lack of energy.: No  Description Entered (Status: maintained). Psychomotor agitation or retardation.: No Description  Entered (Status: maintained). Social withdrawal.: No Description Entered (Status: improved).  Problems Addressed  Unipolar Depression, Unipolar Depression, Unipolar Depression, Unipolar Depression  Goals 1. Alleviate depressive symptoms and return to previous level of effective  functioning. 2. Develop healthy interpersonal relationships that lead to the alleviation  and help prevent the relapse of depression. Objective Describe current and past experiences with depression including their impact on functioning and  attempts to resolve it. Target Date: 2022-04-30 Frequency: Monthly Progress: 60 Modality: individual Related Interventions 1. Encourage the client to share his/her thoughts  and feelings of depression; express empathy and  build rapport while identifying primary cognitive, behavioral, interpersonal, or other  contributors to depression. Objective Learn and  implement behavioral strategies to overcome depression. Target Date: 2022-04-30 Frequency: Monthly Progress: 80 Modality: individual Objective Identify and replace thoughts and beliefs that support depression. Target Date: 2022-04-30 Frequency: Monthly Progress: 70 Modality: individual Related Interventions 1. Conduct Cognitive-Behavioral Therapy (see Cognitive Behavior Therapy by Olevia Bowens; Overcoming Depression by Lynita Lombard al.), beginning with helping the client learn the connection among  cognition, depressive feelings, and actions. 2. Explore and restructure underlying assumptions and beliefs reflected in biased self-talk that  may put the client at risk for relapse or recurrence. 3. Facilitate and reinforce the client's shift from biased depressive self-talk and beliefs to realitybased cognitive messages that enhance self-confidence and increase adaptive actions (see  "Positive Self-Talk" in the Adult Psychotherapy Homework Planner by Bryn Gulling). Objective Learn and implement problem-solving and decision-making skills. Target Date: 2022-04-30 Frequency: Monthly Progress: 70 Modality: individual Related Interventions 1. Encourage in the client the development of a positive problem orientation in which problems  and solving them are viewed as a natural part of life and not something to be feared, despaired,  or avoided. 2. Conduct Problem-Solving Therapy (see Problem-Solving Therapy by Shawnee Knapp and Delane Ginger) using techniques such as psychoeducation, modeling, and role-playing to teach client problem-solving skills (i.e., defining a problem specifically, generating possible solutions, evaluating the pros  and cons of each solution, selecting and implementing a plan of action, evaluating the efficacy  of the plan, accepting or revising the plan); role-play application of the problem-solving skill to  a real life issue (or assign "Applying Problem-Solving to Interpersonal Conflict" in the Adult   Psychotherapy Homework Planner by Bryn Gulling). 3. Develop healthy thinking patterns and beliefs about self, others, and the world that lead to the alleviation and help prevent the relapse of  depression. 4. Recognize, accept, and cope with feelings of depression. Diagnosis Axis  none F43.21 (Adjustment Disorder, With depressed mood) - Open -  [Signifier: n/a]  Adjustment Disorder,  With Depressed Mood  Axis  none 296.32 (Major depressive affective disorder, recurrent episode,  moderate) - Open - [Signifier: n/a]  Conditions For Discharge Achievement of treatment goals and objectives   Plan is to continue to offer supportive therapy to patient to help him deal with his depression related to his Parkinsons diagnosis.  Patient approved Treatment Plan   Maelyn Berrey G Binta Statzer, LCSW                  Kaylise Blakeley G Joycelynn Fritsche, LCSW               Mikhayla Phillis G Estefania Kamiya, LCSW               Takeia Ciaravino [employer], LCSW               Talina Pleitez [employer], LCSW               Tennis Mckinnon [employer], LCSW               Amare Bail [employer], LCSW               Remi Lopata [employer], LCSW               Timothea Bodenheimer G Ricketta Colantonio, LCSW               Jemia Fata [employer], LCSW

## 2021-11-17 ENCOUNTER — Ambulatory Visit: Payer: Medicare PPO | Admitting: Physical Therapy

## 2021-11-22 ENCOUNTER — Ambulatory Visit: Payer: Medicare PPO | Admitting: Physical Therapy

## 2021-11-22 ENCOUNTER — Ambulatory Visit (INDEPENDENT_AMBULATORY_CARE_PROVIDER_SITE_OTHER): Payer: Medicare PPO

## 2021-11-22 DIAGNOSIS — I495 Sick sinus syndrome: Secondary | ICD-10-CM | POA: Diagnosis not present

## 2021-11-23 LAB — CUP PACEART REMOTE DEVICE CHECK
Battery Remaining Longevity: 152 mo
Battery Voltage: 3.04 V
Brady Statistic AP VP Percent: 0.04 %
Brady Statistic AP VS Percent: 99.08 %
Brady Statistic AS VP Percent: 0 %
Brady Statistic AS VS Percent: 0.88 %
Brady Statistic RA Percent Paced: 99.1 %
Brady Statistic RV Percent Paced: 0.04 %
Date Time Interrogation Session: 20230807013114
Implantable Lead Implant Date: 20220506
Implantable Lead Implant Date: 20220506
Implantable Lead Location: 753859
Implantable Lead Location: 753860
Implantable Lead Model: 5076
Implantable Lead Model: 5076
Implantable Pulse Generator Implant Date: 20220506
Lead Channel Impedance Value: 361 Ohm
Lead Channel Impedance Value: 361 Ohm
Lead Channel Impedance Value: 513 Ohm
Lead Channel Impedance Value: 532 Ohm
Lead Channel Pacing Threshold Amplitude: 0.75 V
Lead Channel Pacing Threshold Amplitude: 0.75 V
Lead Channel Pacing Threshold Pulse Width: 0.4 ms
Lead Channel Pacing Threshold Pulse Width: 0.4 ms
Lead Channel Sensing Intrinsic Amplitude: 1.25 mV
Lead Channel Sensing Intrinsic Amplitude: 1.25 mV
Lead Channel Sensing Intrinsic Amplitude: 11.25 mV
Lead Channel Sensing Intrinsic Amplitude: 11.25 mV
Lead Channel Setting Pacing Amplitude: 1.5 V
Lead Channel Setting Pacing Amplitude: 2 V
Lead Channel Setting Pacing Pulse Width: 0.4 ms
Lead Channel Setting Sensing Sensitivity: 1.2 mV

## 2021-11-24 ENCOUNTER — Ambulatory Visit: Payer: Medicare PPO | Admitting: Physical Therapy

## 2021-11-25 DIAGNOSIS — Z4549 Encounter for adjustment and management of other implanted nervous system device: Secondary | ICD-10-CM | POA: Diagnosis not present

## 2021-11-25 DIAGNOSIS — Z79899 Other long term (current) drug therapy: Secondary | ICD-10-CM | POA: Diagnosis not present

## 2021-11-25 DIAGNOSIS — F0282 Dementia in other diseases classified elsewhere, unspecified severity, with psychotic disturbance: Secondary | ICD-10-CM | POA: Diagnosis not present

## 2021-11-25 DIAGNOSIS — R262 Difficulty in walking, not elsewhere classified: Secondary | ICD-10-CM | POA: Diagnosis not present

## 2021-11-25 DIAGNOSIS — F0390 Unspecified dementia without behavioral disturbance: Secondary | ICD-10-CM | POA: Diagnosis not present

## 2021-11-25 DIAGNOSIS — R471 Dysarthria and anarthria: Secondary | ICD-10-CM | POA: Diagnosis not present

## 2021-11-25 DIAGNOSIS — R278 Other lack of coordination: Secondary | ICD-10-CM | POA: Diagnosis not present

## 2021-11-25 DIAGNOSIS — G20 Parkinson's disease: Secondary | ICD-10-CM | POA: Diagnosis not present

## 2021-11-25 DIAGNOSIS — R1312 Dysphagia, oropharyngeal phase: Secondary | ICD-10-CM | POA: Diagnosis not present

## 2021-11-25 DIAGNOSIS — F0280 Dementia in other diseases classified elsewhere without behavioral disturbance: Secondary | ICD-10-CM | POA: Diagnosis not present

## 2021-11-25 DIAGNOSIS — Z9689 Presence of other specified functional implants: Secondary | ICD-10-CM | POA: Diagnosis not present

## 2021-11-29 ENCOUNTER — Ambulatory Visit: Payer: Medicare PPO | Admitting: Physical Therapy

## 2021-11-30 ENCOUNTER — Ambulatory Visit: Payer: Medicare PPO | Attending: Family Medicine

## 2021-11-30 ENCOUNTER — Ambulatory Visit: Payer: Medicare PPO

## 2021-11-30 DIAGNOSIS — R296 Repeated falls: Secondary | ICD-10-CM | POA: Diagnosis not present

## 2021-11-30 DIAGNOSIS — R262 Difficulty in walking, not elsewhere classified: Secondary | ICD-10-CM | POA: Diagnosis not present

## 2021-11-30 DIAGNOSIS — M6281 Muscle weakness (generalized): Secondary | ICD-10-CM | POA: Insufficient documentation

## 2021-11-30 DIAGNOSIS — R278 Other lack of coordination: Secondary | ICD-10-CM | POA: Insufficient documentation

## 2021-11-30 DIAGNOSIS — R269 Unspecified abnormalities of gait and mobility: Secondary | ICD-10-CM | POA: Diagnosis not present

## 2021-11-30 DIAGNOSIS — G20 Parkinson's disease: Secondary | ICD-10-CM | POA: Insufficient documentation

## 2021-11-30 DIAGNOSIS — R2681 Unsteadiness on feet: Secondary | ICD-10-CM | POA: Insufficient documentation

## 2021-11-30 DIAGNOSIS — R2689 Other abnormalities of gait and mobility: Secondary | ICD-10-CM | POA: Insufficient documentation

## 2021-11-30 NOTE — Therapy (Signed)
OUTPATIENT PHYSICAL THERAPY NEURO RE-EVALUATION/RECERTIFICATION FOR DATES 11/30/2021- 02/22/2022   Patient Name: [name] MRN: [id number] DOB:Apr 07, 1951, 71 y.o., male Today's Date: 12/01/2021   PCP: Dr. Elsie Stain REFERRING PROVIDER: Dr. Sharolyn Douglas   PT End of Session - 11/30/21 1443     Visit Number 67    Number of Visits 92    Date for PT Re-Evaluation 02/22/22    Authorization Type [employer] Medicare Choice PPO    Authorization Time Period Recert [id number]    Progress Note Due on Visit 30    PT Start Time 1432    PT Stop Time 1515    PT Time Calculation (min) 43 min    Equipment Utilized During Treatment Gait belt    Activity Tolerance Patient tolerated treatment well    Behavior During Therapy WFL for tasks assessed/performed             Past Medical History:  Diagnosis Date   Arthritis    Bradycardia    Cancer ([place]) 2013   skin cancer   Depression    ptsd   Dysrhythmia    chronic slow heart rate   GERD (gastroesophageal reflux disease)    Headache(784.0)    tension headaches non recent   History of chicken pox    History of kidney stones    passed   Hypertension    treated with HCTZ   Pacemaker    Parkinson's disease ([place])    dx'ed 15 years ago   PTSD (post-traumatic stress disorder)    Shortness of breath dyspnea    Sleep apnea    doesn't use C-pap   Varicose veins    Past Surgical History:  Procedure Laterality Date   CHOLECYSTECTOMY N/A 10/22/2014   Procedure: LAPAROSCOPIC CHOLECYSTECTOMY WITH INTRAOPERATIVE CHOLANGIOGRAM;  Surgeon: Dia Crawford III, MD;  Location: [facility] ORS;  Service: General;  Laterality: N/A;   cyst removed      from lip as a child   INTRAMEDULLARY (IM) NAIL INTERTROCHANTERIC N/A 02/18/2019   Procedure: INTRAMEDULLARY (IM) NAIL INTERTROCHANTRIC, RIGHT,;  Surgeon: Thornton Park, MD;  Location: [facility] ORS;  Service: Orthopedics;  Laterality: N/A;   LUMBAR LAMINECTOMY/DECOMPRESSION MICRODISCECTOMY Bilateral  12/14/2012   Procedure: Bilateral lumbar three-four, four-five decompressive laminotomy/foraminotomy;  Surgeon: Charlie Pitter, MD;  Location: [place] [facility]URO ORS;  Service: Neurosurgery;  Laterality: Bilateral;   PULSE GENERATOR IMPLANT Bilateral 12/13/2013   Procedure: Bilateral implantable pulse generator placement;  Surgeon: Erline Levine, MD;  Location: [place] NEURO ORS;  Service: Neurosurgery;  Laterality: Bilateral;  Bilateral implantable pulse generator placement   skin cancer removed     from ears,   12 lft arm  rt leg 15   SUBTHALAMIC STIMULATOR BATTERY REPLACEMENT Bilateral 07/14/2017   Procedure: BILATERAL IMPLANTED PULSE GENERATOR CHANGE FOR DEEP BRAIN STIMULATOR;  Surgeon: Erline Levine, MD;  Location: [place];  Service: Neurosurgery;  Laterality: Bilateral;   SUBTHALAMIC STIMULATOR INSERTION Bilateral 12/06/2013   Procedure: SUBTHALAMIC STIMULATOR INSERTION;  Surgeon: Erline Levine, MD;  Location: [place] NEURO ORS;  Service: Neurosurgery;  Laterality: Bilateral;  Bilateral deep brain stimulator placement   Patient Active Problem List   Diagnosis Date Noted   Hematuria 10/10/2021   Sinus drainage 12/09/2020   Medicare annual wellness visit, subsequent 11/18/2020   Pacemaker    Dementia associated with Parkinson's disease ([employer]) 06/17/2020   Vertigo 02/19/2020   Urine abnormality 02/19/2020   Dysphagia 02/23/2019   Fall at home 10/18/2017   REM behavioral disorder 11/02/2016   Radicular pain in right  arm 10/09/2016   GERD (gastroesophageal reflux disease) 07/31/2016   Colon cancer screening 07/23/2015   Gout 02/18/2015   Depression 01/06/2015   Skin lesion 10/02/2014   Dupuytren's contracture 08/20/2014   Cough 07/21/2014   Lumbar stenosis with neurogenic claudication 06/13/2014   S/P deep brain stimulator placement 05/08/2014   Advance care planning 01/19/2014   Parkinson's disease ([place]) 12/13/2013   PTSD (post-traumatic stress disorder) 06/13/2013   Erectile dysfunction 06/13/2013   HLD  (hyperlipidemia) 06/13/2013   Essential hypertension 06/03/2013   Bradycardia by electrocardiogram 06/03/2013   Obstructive sleep apnea 03/12/2013    ONSET DATE: 15 years ago per chart  REFERRING DIAG: Parkinson's disease  THERAPY DIAG:  Difficulty in walking, not elsewhere classified  Other lack of coordination  Unsteadiness on feet  Repeated falls  Parkinson's disease (HCC)  Muscle weakness (generalized)  Abnormality of gait and mobility  Other abnormalities of gait and mobility  Rationale for Evaluation and Treatment Rehabilitation  SUBJECTIVE:                                                                                                                                                                                              SUBJECTIVE STATEMENT: Patient reports he has been in the hospital twice and states he is weaker now than he has been in a long time. Wife reports he is having falls or near falls often including yesterday while getting out of bed.  Pt accompanied by: significant other  PERTINENT HISTORY:   Date of most recent Admission: 09/25/2021 Date of Discharge: 09/28/2021  Discharge Diagnoses:  Altered mental status Parkinson disease (CMS/HCC) Hallucinations  Per [facility] discharge summary notes PHI : Hospital Course:   71 y.o. male with a history of Parkinson's Disease w/ DBS in place, PD-related dementia, OSA, depression, and seizure disorder who presented for EEG given concern for seizures.  Altered mental status Concern seizure activity Periods of unresponsiveness on the setting of new medication--Nuplazid (pimavanserin) recently started. No events of note on continuous EEG. This was held and he returned to his baseline mental status. Other workup including CT head, CTA h/n without obvious cause for periodic unresponsiveness. Nuplazid was discontinued.   Aspiration, weakness Of note, he had a coughing spell while eating though sounds like was  precipitated by sneezing. Did well on RA, seen by SLP without any further needs or concerns.  Parkinson's Disease Parkinson's-related dementia - He follows in the [facility] center at [facility]. Clinic notes suggest he seems to have worsening of his overall disease trajectory. - Continued home Memantine and Sinemet      PMH: HTN, bradycardia  s/p PPM, PD, DBS, COVID-19 infection, chronic low back pain s/p L4/5 surgery.  PAIN:  Are you having pain? Yes: NPRS scale: 7/10 Pain location: Low back Pain description: ache Aggravating factors: Prolonged standing/sitting, bending over Relieving factors: rest, changing positions  PRECAUTIONS: Fall  WEIGHT BEARING RESTRICTIONS No  FALLS: Has patient fallen in last 6 months? Yes. Number of falls >10  LIVING ENVIRONMENT: Lives with: lives with their spouse Lives in: House/apartment Stairs:  Ramp Has following equipment at home: Single point cane, Environmental consultant - 4 wheeled, Wheelchair (manual), shower chair, bed side commode, Grab bars, and Ramped entry  PLOF: Needs assistance with ADLs, Needs assistance with homemaking, Needs assistance with gait, and Needs assistance with transfers  PATIENT GOALS To walk better and not fall  OBJECTIVE:   DIAGNOSTIC FINDINGS:   COGNITION: Overall cognitive status: History of cognitive impairments - at baseline   SENSATION: Light touch: WFL  COORDINATION: Decreased and slow to initiate  EDEMA:  None observed     POSTURE: rounded shoulders, forward head, increased thoracic kyphosis, and flexed trunk   LOWER EXTREMITY ROM:       Right Eval Left Eval  Hip flexion    Hip extension    Hip abduction    Hip adduction    Hip internal rotation    Hip external rotation    Knee flexion    Knee extension    Ankle dorsiflexion    Ankle plantarflexion    Ankle inversion    Ankle eversion     (Blank rows = not tested)  LOWER EXTREMITY MMT:    MMT Right Eval Left Eval  Hip flexion 3+ 3+   Hip extension 3+ 3+  Hip abduction 3+  3+  Hip adduction 3+ 3+  Hip internal rotation 3+ 3+  Hip external rotation 3+ 3+  Knee flexion 3+_ 3+  Knee extension 3+ 3+  Ankle dorsiflexion 4 4  Ankle plantarflexion 4 4  Ankle inversion    Ankle eversion    (Blank rows = not tested)    TRANSFERS: Assistive device utilized:  Upright 4WW   Sit to stand:  Min/mod assist- with increased VC for safety Stand to sit:  Min assist with Verbal cues to lock brakes and reach back Chair to chair: Min A     GAIT: Gait pattern: decreased arm swing- Right, decreased arm swing- Left, decreased step length- Right, decreased step length- Left, decreased stance time- Right, decreased stance time- Left, decreased stride length, and shuffling Distance walked: 150 ft. Assistive device utilized:  upright 4WW Level of assistance: Min A Comments: Shuffling gait with slow gait speed  FUNCTIONAL TESTs:  5 times sit to stand: 31.0 sec with BUE support Timed up and go (TUG): 53.56 sec with upright 4WW 6 minute walk test: 330 feet using upright 4WW 10 meter walk test: 0.4 m/s  PATIENT SURVEYS:  FOTO 50  TODAY'S TREATMENT:  Eval Only   PATIENT EDUCATION: Education details: PT plan of care; purpose of functional outcomes Person educated: Patient and Spouse Education method: Explanation and Verbal cues Education comprehension: verbalized understanding   HOME EXERCISE PROGRAM: To be reviewed and progressed next 1-2 visits    GOALS: Goals reviewed with patient? Yes  SHORT TERM GOALS: Target date: 01/11/2022  Pt will be independent with HEP in order to improve strength and balance in order to decrease fall risk and improve function at home and work.  Baseline: Patient reports not performing much in the way of any HEP except  walking. Goal status: INITIAL  LONG TERM GOALS: Target date: 02/22/2022  Pt will decrease 5TSTS by at least 5 seconds in order to demonstrate clinically significant  improvement in LE strength. Baseline: 11/30/2021= 31.0 sec with B UE Support Goal status: INITIAL  2.  Pt will improve FOTO to target score of 51 to display perceived improvements in ability to complete ADL's.  Baseline: 11/30/2021=50 Goal status: INITIAL  3.  Pt will decrease TUG to < or = to 38 seconds/decrease in order to demonstrate decreased fall risk. Baseline: 11/30/2021= 53.56 sec using upright 4WW Goal status: INITIAL  4.  Pt will increase 10MWT by at least 0.13 m/s in order to demonstrate clinically significant improvement in community ambulation.   Baseline: 11/30/2021= 0.4 m/s using upright 4WW Goal status: INITIAL  5.  Patient will increase six minute walk test distance to >1000 for progression to community ambulator and improve gait ability  Baseline: 11/30/2021= 330 feet Goal status: IN PROGRESS  6.  Patient will experience 1 fall or less per week for at least 3 weeks in order to improve his frequency of falls and prevent injury from falls  Baseline: 11/30/2021-Patient presents with recount of multiple recent falls including 1 yesterday. Goal status: IN PROGRESS  ASSESSMENT:  CLINICAL IMPRESSION: Patient is a 71  y.o. male who was seen today for physical therapy re-evaluation and treatment for Parkinson's disease. Patient recently was re-hospitalized twice in June and presents with increased weakness as seen by decreased in functional outcome measures including 5 sec STS; TUG, 10 MWT, and 6 min walk test. He presents with increased overall weakness, immobility with shuffling gait and impaired balance placing him at a high risk for falling. He was progressing well with PT services prior to these hospitalizations. Patient's condition has the potential to improve in response to therapy. Maximum improvement is yet to be obtained. The anticipated improvement is attainable and reasonable in a generally predictable time.  Pt will continue to benefit from skilled PT intervnention to  iomprove his strength, balance, prevent falls, and improve QOL and caregiver burden.    OBJECTIVE IMPAIRMENTS Abnormal gait, decreased activity tolerance, decreased balance, decreased cognition, decreased coordination, decreased endurance, decreased mobility, difficulty walking, decreased ROM, decreased strength, decreased safety awareness, hypomobility, impaired perceived functional ability, and pain.   ACTIVITY LIMITATIONS carrying, lifting, bending, standing, squatting, stairs, transfers, bed mobility, bathing, toileting, dressing, self feeding, hygiene/grooming, and caring for others  PARTICIPATION LIMITATIONS: meal prep, cleaning, laundry, medication management, personal finances, driving, shopping, community activity, and yard work  PERSONAL FACTORS Time since onset of injury/illness/exacerbation are also affecting patient's functional outcome.   REHAB POTENTIAL: Fair    CLINICAL DECISION MAKING: Evolving/moderate complexity  EVALUATION COMPLEXITY: Moderate  PLAN: PT FREQUENCY: 2x/week  PT DURATION: 12 weeks  PLANNED INTERVENTIONS: Therapeutic exercises, Therapeutic activity, Neuromuscular re-education, Balance training, Gait training, Patient/Family education, Self Care, Joint mobilization, Joint manipulation, Stair training, Vestibular training, Canalith repositioning, DME instructions, Dry Needling, Electrical stimulation, Spinal manipulation, Spinal mobilization, Cryotherapy, Moist heat, Manual therapy, and Re-evaluation  PLAN FOR NEXT SESSION: Continue to test balance and focus on ROM, LE strengthening, Transfer training, and gait training.    Lewis Moccasin, PT 12/01/2021, 3:08 PM

## 2021-12-01 ENCOUNTER — Ambulatory Visit: Payer: Medicare PPO | Admitting: Physical Therapy

## 2021-12-01 ENCOUNTER — Telehealth: Payer: Self-pay | Admitting: Family Medicine

## 2021-12-01 NOTE — Telephone Encounter (Signed)
Received triage note below. I have called and spoke to wife he is in [place] for evaluation at this time.   [facility] Night - Client TELEPHONE ADVICE RECORD [facility] Patient Name: [name] Gender: Male DOB: 07-23-1950 Age: 71 Y 20 M 30 D Return Phone Number: [phone] (Primary) Address: City/ State/ Zip: [place]  [address] Client [facility] Night - Client Client Site [place] Provider Renford Dills - MD Contact Type Call Who Is Calling Patient / Member / Family / Caregiver Call Type Triage / Clinical Caller Name [place] Relationship To Patient Spouse Return Phone Number [phone] (Primary) Chief Complaint CONFUSION - new onset Reason for Call Symptomatic / Request for [place] states her husband is having fever 101. and very confused. Translation No Nurse Assessment Nurse: Lucky Cowboy, RN, Levada Dy Date/Time (Eastern Time): 12/01/2021 7:34:22 AM Confirm and document reason for call. If symptomatic, describe symptoms. ---Caller stated that her husband is more confused than normal. He has confusion. T-101 forehead and has given Tylenol. He woke up with these s/s. He is incontinent (not new). Does the patient have any new or worsening symptoms? ---Yes Will a triage be completed? ---Yes Related visit to physician within the last 2 weeks? ---Yes Does the PT have any chronic conditions? (i.e. diabetes, asthma, this includes High risk factors for pregnancy, etc.) ---Yes List chronic conditions. ---Parkinsons, frequent UTI, pacemaker, HTN, depression, GERD, seasonal allergies Is this a behavioral health or substance abuse call? ---No Guidelines Guideline Title Affirmed Question Affirmed Notes Nurse Date/Time (Eastern Time) Confusion - Delirium Fever > 100.4 F (38.0 C) Dew, RN, Levada Dy 12/01/2021 7:38:15 AM Disp. Time Eilene Ghazi Time)  Disposition Final User 12/01/2021 7:32:17 AM Send to Urgent Queue Idolina Primer 12/01/2021 7:43:26 AM Go to ED Now (or PCP triage) Yes Dew, RN, Levada Dy PLEASE NOTE: All timestamps contained within this report are represented as [place] Standard Time. CONFIDENTIALTY NOTICE: This fax transmission is intended only for the addressee. It contains information that is legally privileged, confidential or otherwise protected from use or disclosure. If you are not the intended recipient, you are strictly prohibited from reviewing, disclosing, copying using or disseminating any of this information or taking any action in reliance on or regarding this information. If you have received this fax in error, please notify us immediately by telephone so that we can arrange for its return to [place]. Phone: [phone], Toll-Free: [phone], Fax: [phone] Page: 2 of 2 Call Id: [id number] Final Disposition 12/01/2021 7:43:26 AM Go to ED Now (or PCP triage) Yes Dew, RN, Marin Shutter Disagree/Comply Comply Caller Understands Yes PreDisposition Call Doctor Care Advice Given Per Guideline GO TO ED NOW (OR PCP TRIAGE): * IF NO PCP (PRIMARY CARE PROVIDER) SECOND-LEVEL TRIAGE: You need to be seen within the next hour. Go to the [facility] at _____________ [place] as soon as you can. FEVER MEDICINES: * For fevers above 101 F (38.3 C) take either acetaminophen or ibuprofen. * They are over-the-counter (OTC) drugs that help treat both fever and pain. You can buy them at the drugstore. * The goal of fever therapy is to bring the fever down to a comfortable level. Remember that fever medicine usually lowers fever 2 degrees F (1 - 1 1/2 degrees C). CARE ADVICE given per Confusion-Delirium (Adult) guideline. Referrals REFERRED TO PCP OFFICE

## 2021-12-01 NOTE — Telephone Encounter (Addendum)
Noted. Plz call tomorrow for update on [facility] evaluation for fever and confusion and notify PCP.

## 2021-12-01 NOTE — Progress Notes (Signed)
  Your signature is required to indicate approval of the treatment plan as stated above. By signing this report, you are approving the plan of care. Please sign and either send electronically or print and fax the signed copy to the number below. If you approve with modifications, please indicate those in the space provided.____    Physician Signature: __Graham Duncan____  Date:___08/16/23 Time:__9:13 PM

## 2021-12-01 NOTE — Telephone Encounter (Signed)
Wife called in stating that her husband was running a fever of 101.5 at 5am this morning. He is experiencing some confusion and she was concerned. I sent him to be triaged.

## 2021-12-01 NOTE — Telephone Encounter (Signed)
Agree.  Thanks.  Please let me know tomorrow.

## 2021-12-02 ENCOUNTER — Ambulatory Visit: Payer: Medicare PPO

## 2021-12-03 NOTE — Telephone Encounter (Signed)
Called and spoke with patients wife. She states patient is still in the hospital at the [place]. They think he may have pancreatitis.

## 2021-12-05 NOTE — Telephone Encounter (Signed)
Noted.  Thanks.  Will await inpatient report.

## 2021-12-06 ENCOUNTER — Ambulatory Visit: Payer: Medicare PPO | Admitting: Physical Therapy

## 2021-12-07 ENCOUNTER — Ambulatory Visit: Payer: Medicare PPO | Admitting: Psychology

## 2021-12-08 ENCOUNTER — Ambulatory Visit: Payer: Medicare PPO | Admitting: Physical Therapy

## 2021-12-08 ENCOUNTER — Encounter: Payer: Medicare PPO | Admitting: Occupational Therapy

## 2021-12-13 ENCOUNTER — Ambulatory Visit: Payer: Medicare PPO

## 2021-12-15 ENCOUNTER — Ambulatory Visit: Payer: Medicare PPO | Admitting: Physical Therapy

## 2021-12-22 ENCOUNTER — Other Ambulatory Visit: Payer: Self-pay

## 2021-12-22 ENCOUNTER — Ambulatory Visit: Payer: Medicare PPO | Admitting: Physical Therapy

## 2021-12-22 ENCOUNTER — Encounter (HOSPITAL_COMMUNITY): Payer: Self-pay

## 2021-12-22 ENCOUNTER — Emergency Department (HOSPITAL_COMMUNITY): Payer: Medicare PPO

## 2021-12-22 ENCOUNTER — Emergency Department (HOSPITAL_COMMUNITY)
Admission: EM | Admit: 2021-12-22 | Discharge: 2021-12-22 | Disposition: A | Discharge status: Home / Self Care | Payer: Medicare PPO | Attending: Emergency Medicine | Admitting: Emergency Medicine

## 2021-12-22 DIAGNOSIS — W19XXXA Unspecified fall, initial encounter: Secondary | ICD-10-CM | POA: Insufficient documentation

## 2021-12-22 DIAGNOSIS — S0003XA Contusion of scalp, initial encounter: Secondary | ICD-10-CM | POA: Insufficient documentation

## 2021-12-22 DIAGNOSIS — M549 Dorsalgia, unspecified: Secondary | ICD-10-CM | POA: Diagnosis not present

## 2021-12-22 DIAGNOSIS — S161XXA Strain of muscle, fascia and tendon at neck level, initial encounter: Secondary | ICD-10-CM | POA: Insufficient documentation

## 2021-12-22 DIAGNOSIS — R55 Syncope and collapse: Secondary | ICD-10-CM | POA: Diagnosis not present

## 2021-12-22 DIAGNOSIS — G20 Parkinson's disease: Secondary | ICD-10-CM | POA: Diagnosis not present

## 2021-12-22 DIAGNOSIS — I672 Cerebral atherosclerosis: Secondary | ICD-10-CM | POA: Diagnosis not present

## 2021-12-22 DIAGNOSIS — R0689 Other abnormalities of breathing: Secondary | ICD-10-CM | POA: Diagnosis not present

## 2021-12-22 DIAGNOSIS — S0990XA Unspecified injury of head, initial encounter: Secondary | ICD-10-CM | POA: Diagnosis not present

## 2021-12-22 DIAGNOSIS — I2510 Atherosclerotic heart disease of native coronary artery without angina pectoris: Secondary | ICD-10-CM | POA: Diagnosis not present

## 2021-12-22 DIAGNOSIS — S199XXA Unspecified injury of neck, initial encounter: Secondary | ICD-10-CM | POA: Diagnosis not present

## 2021-12-22 DIAGNOSIS — M4692 Unspecified inflammatory spondylopathy, cervical region: Secondary | ICD-10-CM | POA: Diagnosis not present

## 2021-12-22 DIAGNOSIS — M47812 Spondylosis without myelopathy or radiculopathy, cervical region: Secondary | ICD-10-CM | POA: Diagnosis not present

## 2021-12-22 DIAGNOSIS — Z7982 Long term (current) use of aspirin: Secondary | ICD-10-CM | POA: Insufficient documentation

## 2021-12-22 LAB — CBC WITH DIFFERENTIAL/PLATELET
Abs Immature Granulocytes: 0.03 10*3/uL (ref 0.00–0.07)
Basophils Absolute: 0 10*3/uL (ref 0.0–0.1)
Basophils Relative: 1 %
Eosinophils Absolute: 0.1 10*3/uL (ref 0.0–0.5)
Eosinophils Relative: 1 %
HCT: 37 % — ABNORMAL LOW (ref 39.0–52.0)
Hemoglobin: 12.8 g/dL — ABNORMAL LOW (ref 13.0–17.0)
Immature Granulocytes: 0 %
Lymphocytes Relative: 20 %
Lymphs Abs: 1.5 10*3/uL (ref 0.7–4.0)
MCH: 31.4 pg (ref 26.0–34.0)
MCHC: 34.6 g/dL (ref 30.0–36.0)
MCV: 90.9 fL (ref 80.0–100.0)
Monocytes Absolute: 0.5 10*3/uL (ref 0.1–1.0)
Monocytes Relative: 6 %
Neutro Abs: 5.3 10*3/uL (ref 1.7–7.7)
Neutrophils Relative %: 72 %
Platelets: 204 10*3/uL (ref 150–400)
RBC: 4.07 MIL/uL — ABNORMAL LOW (ref 4.22–5.81)
RDW: 12.7 % (ref 11.5–15.5)
WBC: 7.4 10*3/uL (ref 4.0–10.5)
nRBC: 0 % (ref 0.0–0.2)

## 2021-12-22 LAB — BASIC METABOLIC PANEL
Anion gap: 11 (ref 5–15)
BUN: 16 mg/dL (ref 8–23)
CO2: 24 mmol/L (ref 22–32)
Calcium: 8.7 mg/dL — ABNORMAL LOW (ref 8.9–10.3)
Chloride: 105 mmol/L (ref 98–111)
Creatinine, Ser: 0.8 mg/dL (ref 0.61–1.24)
GFR, Estimated: >60 mL/min (ref >60)
Glucose, Bld: 176 mg/dL — ABNORMAL HIGH (ref 70–99)
Potassium: 3.8 mmol/L (ref 3.5–5.1)
Sodium: 140 mmol/L (ref 135–145)

## 2021-12-22 LAB — TROPONIN I (HIGH SENSITIVITY)
Troponin I (High Sensitivity): 4 ng/L (ref <18)
Troponin I (High Sensitivity): 6 ng/L (ref <18)

## 2021-12-22 NOTE — ED Notes (Signed)
All discharge instructions including follow up care reviewed with patient and patient's wife at bedside. Patient and wife verbalized understanding of same and had no other questions. Patient stable at time of discharge and wheel chair was provided to patient prior to leaving department.

## 2021-12-22 NOTE — ED Triage Notes (Signed)
Ground level fall at home, patient fell backwards and hit his head on window sill when trying to get dressed this AM. Brief LOC per wife. No blood thinners, only complains of neck and back pain. C-collar in place and hematoma to right side of patient's head. GCS 15 on arrival. BP 143/92, CBG 176.

## 2021-12-22 NOTE — Discharge Instructions (Signed)
1.  Take extra strength Tylenol as needed every 6 hours for pain.  You may apply well wrapped ice pack to your neck or scalp if you have pain.  After the first several days, warm moist heat may feel good on your neck. 2.  Return to the emergency department if you have new or concerning symptoms such as unusual confusion, visual changes, incoordination, vomiting or other concerning symptoms.

## 2021-12-22 NOTE — ED Provider Notes (Signed)
[facility] EMERGENCY DEPARTMENT Provider Note   CSN: [id number] Arrival date & time: 12/22/21  1133     History  Chief Complaint  Patient presents with   [name] is a 71 y.o. male.  HPI Patient has Parkinson's disease.  He does have frequent falls and gait incoordination.  Patient reports that he was trying to get dressed and he feels like he came close to passing out or lost his balance and fell backwards hitting his head on the windowsill.  His wife heard him and immediately went to check on him.  She reports for just a few minutes he seemed like he was poorly responsive.  Eyes were open but not really focusing and he seemed to have his tongue off to the side a little bit.  He then regained his normal level of consciousness.  He does have a hematoma to the right scalp.  Patient is denying any severe headache at this time.  He reports he does have discomfort in the back of his neck.  He denies any chest pain, shortness of breath.  He has recently been at baseline for activity level.  No recent changes.  Patient does not take blood thinners.    Home Medications Prior to Admission medications   Medication Sig Start Date End Date Taking? Authorizing Provider  acetaminophen (TYLENOL) 325 MG tablet Take 650 mg by mouth every 6 (six) hours as needed.    [provider]  aspirin EC 81 MG tablet Take 1 tablet (81 mg total) by mouth daily. Swallow whole. 11/18/19   Loel Dubonnet, NP  Carbidopa-Levodopa ER (RYTARY) 48.75-195 MG CPCR Take 2 capsules by mouth 3 (three) times daily. 08/12/21   Tonia Ghent, MD  cephALEXin (KEFLEX) 500 MG capsule Take 1 capsule (500 mg total) by mouth 3 (three) times daily. 10/10/21   Tonia Ghent, MD  cholecalciferol (VITAMIN D) 1000 UNITS tablet Take 1,000 Units by mouth daily.    [provider]  Cranberry 200 MG CAPS Take by mouth.    [provider]  diclofenac Sodium (VOLTAREN) 1 % GEL Apply  topically 4 (four) times daily.    [provider]  escitalopram (LEXAPRO) 20 MG tablet Take 1 tablet (20 mg total) by mouth daily. 08/12/21   Tonia Ghent, MD  fluticasone Asencion Islam) 50 MCG/ACT nasal spray Place 2 sprays into both nostrils daily. 10/07/21   Tonia Ghent, MD  Melatonin 10 MG TABS Take 10 mg by mouth at bedtime.    [provider]  memantine (NAMENDA) 10 MG tablet TAKE 1 TABLET TWICE A DAY 02/02/21   Tonia Ghent, MD  midodrine (PROAMATINE) 5 MG tablet Take 1 tablet (5 mg total) by mouth as needed. For orthostasis, drops in pressure <100 when standing 12/29/20   Minna Merritts, MD  naproxen sodium (ALEVE) 220 MG tablet Take 220 mg by mouth.    [provider]  Nystatin POWD Use twice a day if needed. 10/07/21   Tonia Ghent, MD  omeprazole (PRILOSEC) 20 MG capsule TAKE 1 CAPSULE DAILY 06/10/21   Tonia Ghent, MD  rivastigmine (EXELON) 9.5 mg/24hr 9.5 mg daily. 09/22/19   [provider]      Allergies    Clonazepam, Morphine and related, Nuplazid [pimavanserin tartrate], Prednisone, and Tramadol    Review of Systems   Review of Systems 10 systems reviewed negative except as per HPI Physical Exam Updated Vital  Signs BP 133/69   Pulse (!) 59   Temp (!) 97.5 F (36.4 C) (Oral)   Resp 19   Ht '5\' 9"'$  (1.753 m)   Wt 75.8 kg   SpO2 97%   BMI 24.66 kg/m  Physical Exam Constitutional:      Comments: Alert nontoxic.  GCS 15.  HENT:     Head:     Comments: Small hematoma without laceration about 1 and half centimeters right parietal scalp.    Nose: Nose normal.     Mouth/Throat:     Pharynx: Oropharynx is clear.     Comments: Very small amount of dried blood at the corner of the patient's mouth.  No obvious intraoral injury.  No active bleeding. Eyes:     Extraocular Movements: Extraocular movements intact.     Pupils: Pupils are equal, round, and reactive to light.  Neck:     Comments: C-collar in place.  Maintained  until CT scans completed. Cardiovascular:     Rate and Rhythm: Normal rate and regular rhythm.  Pulmonary:     Effort: Pulmonary effort is normal.     Breath sounds: Normal breath sounds.  Chest:     Chest wall: No tenderness.  Abdominal:     General: There is no distension.     Palpations: Abdomen is soft.     Tenderness: There is no abdominal tenderness. There is no guarding.  Musculoskeletal:        General: No swelling, tenderness or signs of injury. Normal range of motion.     Right lower leg: No edema.     Left lower leg: No edema.  Skin:    General: Skin is warm and dry.  Neurological:     Comments: Patient is alert and interactive.  No focal motor deficits.  He follows commands appropriately for grip strength and flexion extension at the feet.  He does have some tremor and parkinsonian movements but is alert and situationally appropriate  Psychiatric:        Mood and Affect: Mood normal.     ED Results / Procedures / Treatments   Labs (all labs ordered are listed, but only abnormal results are displayed) Labs Reviewed  BASIC METABOLIC PANEL - Abnormal; Notable for the following components:      Result Value   Glucose, Bld 176 (*)    Calcium 8.7 (*)    All other components within normal limits  CBC WITH DIFFERENTIAL/PLATELET - Abnormal; Notable for the following components:   RBC 4.07 (*)    Hemoglobin 12.8 (*)    HCT 37.0 (*)    All other components within normal limits  TROPONIN I (HIGH SENSITIVITY)  TROPONIN I (HIGH SENSITIVITY)    EKG EKG Interpretation  Date/Time:  Wednesday December 22 2021 14:32:57 EDT Ventricular Rate:  62 PR Interval:    QRS Duration: 102 QT Interval:  441 QTC Calculation: 448 R Axis:   -42 Text Interpretation: narrow comlex, regular rythm. significant artifact but no acute ischemic appearance, More artifact on previous comparison Confirmed by Charlesetta Shanks [phone]) on 12/22/2021 3:13:57 PM  Radiology CT Head Wo  Contrast  Result Date: 12/22/2021 CLINICAL DATA:  Status post fall.  Neck trauma. EXAM: CT HEAD WITHOUT CONTRAST CT CERVICAL SPINE WITHOUT CONTRAST TECHNIQUE: Multidetector CT imaging of the head and cervical spine was performed following the standard protocol without intravenous contrast. Multiplanar CT image reconstructions of the cervical spine were also generated. RADIATION DOSE REDUCTION: This exam was performed according to  the departmental dose-optimization program which includes automated exposure control, adjustment of the mA and/or kV according to patient size and/or use of iterative reconstruction technique. COMPARISON:  10/02/2019 FINDINGS: CT HEAD FINDINGS Brain: Deep brain stimulator again noted unchanged. No evidence of acute infarction, hemorrhage, extra-axial collection, ventriculomegaly, or mass effect. Generalized cerebral atrophy. Vascular: Cerebrovascular atherosclerotic calcifications are noted. No hyperdense vessels. Skull: Negative for fracture or focal lesion. Sinuses/Orbits: Visualized portions of the orbits are unremarkable. Visualized portions of the paranasal sinuses are unremarkable. Visualized portions of the mastoid air cells are unremarkable. Other: None. CT CERVICAL SPINE FINDINGS Alignment: Normal. Skull base and vertebrae: No acute fracture. No primary bone lesion or focal pathologic process. Soft tissues and spinal canal: No prevertebral fluid or swelling. No visible canal hematoma. Disc levels: Degenerative disease with disc height loss at C5-6 and C6-7. Bilateral uncovertebral degenerative changes at C6-7 with foraminal encroachment. Left foraminal narrowing at C5-6. Bilateral facet arthropathy at C3-4, C4-5 and C5-6. Upper chest: Lung apices are clear. Other: No fluid collection or hematoma. IMPRESSION: 1. No acute intracranial pathology. 2. No acute fracture or subluxation of the cervical spine. Electronically Signed   By: Kathreen Devoid M.D.   On: 12/22/2021 13:13   CT  Cervical Spine Wo Contrast  Result Date: 12/22/2021 CLINICAL DATA:  Status post fall.  Neck trauma. EXAM: CT HEAD WITHOUT CONTRAST CT CERVICAL SPINE WITHOUT CONTRAST TECHNIQUE: Multidetector CT imaging of the head and cervical spine was performed following the standard protocol without intravenous contrast. Multiplanar CT image reconstructions of the cervical spine were also generated. RADIATION DOSE REDUCTION: This exam was performed according to the departmental dose-optimization program which includes automated exposure control, adjustment of the mA and/or kV according to patient size and/or use of iterative reconstruction technique. COMPARISON:  10/02/2019 FINDINGS: CT HEAD FINDINGS Brain: Deep brain stimulator again noted unchanged. No evidence of acute infarction, hemorrhage, extra-axial collection, ventriculomegaly, or mass effect. Generalized cerebral atrophy. Vascular: Cerebrovascular atherosclerotic calcifications are noted. No hyperdense vessels. Skull: Negative for fracture or focal lesion. Sinuses/Orbits: Visualized portions of the orbits are unremarkable. Visualized portions of the paranasal sinuses are unremarkable. Visualized portions of the mastoid air cells are unremarkable. Other: None. CT CERVICAL SPINE FINDINGS Alignment: Normal. Skull base and vertebrae: No acute fracture. No primary bone lesion or focal pathologic process. Soft tissues and spinal canal: No prevertebral fluid or swelling. No visible canal hematoma. Disc levels: Degenerative disease with disc height loss at C5-6 and C6-7. Bilateral uncovertebral degenerative changes at C6-7 with foraminal encroachment. Left foraminal narrowing at C5-6. Bilateral facet arthropathy at C3-4, C4-5 and C5-6. Upper chest: Lung apices are clear. Other: No fluid collection or hematoma. IMPRESSION: 1. No acute intracranial pathology. 2. No acute fracture or subluxation of the cervical spine. Electronically Signed   By: Kathreen Devoid M.D.   On: 12/22/2021  13:13    Procedures Procedures    Medications Ordered in ED Medications - No data to display  ED Course/ Medical Decision Making/ A&P                           Medical Decision Making Amount and/or Complexity of Data Reviewed Labs: ordered. Radiology: ordered.   Patient with history of Parkinson's disease and prior episodes of syncope\near syncope and imbalance with fall.  Today patient is alert and appropriate.  He does have a small hematoma to the scalp.  GCS is 15.  With risk factors of advanced age and comorbid condition,  will proceed with CT head.  Patient also endorses some cervical spine discomfort.  No focal neurologic deficits different from baseline.  We will proceed with CT C-spine due to mechanism of injury and age.  Patient is alert and interactive and at baseline per the patient's wife.  Patient describes possible syncope versus mechanical fall.  We will proceed with EKG, troponin and basic lab work.  Patient has remained at baseline.  CT head and C-spine reviewed by radiology do not show any acute findings.  We will remove cervical collar and patient is stable for something to eat and drink.  He is feeling well and at baseline.  EKG without any acute findings.  Patient does have significant artifact on the tracing however narrow complex and regular without any immediate ischemic appearance and normal troponin.  At this time with patient returned to normal baseline and diagnostic work-up unremarkable with stable vital signs, I do feel patient is stable for discharge.  Falls and imbalance have been a recurring concern but at this time patient is doing well and he and his wife are eager to be back at home.  Feel he is stable for discharge with careful return precautions.        Final Clinical Impression(s) / ED Diagnoses Final diagnoses:  Fall, initial encounter  Syncope, unspecified syncope type  Minor head injury, initial encounter  Acute strain of neck muscle,  initial encounter    Rx / DC Orders ED Discharge Orders     None         Charlesetta Shanks, MD 12/22/21 [phone]

## 2021-12-23 NOTE — Progress Notes (Deleted)
Cardiology Office Note  Date:  12/23/2021   ID:  NAHOME BUBLITZ, DOB 09-24-1950, MRN [id number]  PCP:  Tonia Ghent, MD   No chief complaint on file.   HPI:  Mr. Rodino is a pleasant 71 -year-old male year-old gentleman with  Parkinson's for more than 10 years,  Hypertension, chronic bradycardia , pacemaker placement of deep brain stimulator August 2015 for tremor followup at the [facility] hyperlipidemia, depression, possible seizure disorder Long-standing history of heart rate 40-50 bpm and in general has been asymptomatic who presents for routine follow-up of his blood pressure and bradycardia, hypertension.   Most of the history provided by patient's wife and chart review, he is alert though relatively nonverbal but will respond to questions  pacemaker implant at [place] with Dr Roderic Palau and has done well since implant. "Color is better", good energy  walked around [place] using a shopping cart without significant limitation which is a big improvement for him.  06/2020: deep brain stimulator replaced, placed on the right Chronic back pain  New walker, more active  "BP dropping with standing" per patient's wife who presents with him today  Chronic fatigue  Lab work reviewed Total cho 140, LDL 76  EKG personally reviewed by myself on todays visit Shows paced rhythm  Other past medical history reviewed Ziopatch monitor showed episodes of supraventricular tachycardia superimposed on a baseline of bradycardia. Seen by EP 11/2019 Plan to repeat Zio in 6 months  CT scan of the head 05/29/2013 showing mild chronic inflammatory disease of the sinuses. This was done for right sided headache. Work done 05/29/2013 includes basic     PMH:   has a past medical history of Arthritis, Bradycardia, Cancer ([place]) (2013), Depression, Dysrhythmia, GERD (gastroesophageal reflux disease), Headache(784.0), History of chicken pox, History of kidney stones, Hypertension, Pacemaker,  Parkinson's disease ([place]), PTSD (post-traumatic stress disorder), Shortness of breath dyspnea, Sleep apnea, and Varicose veins.  PSH:    Past Surgical History:  Procedure Laterality Date   CHOLECYSTECTOMY N/A 10/22/2014   Procedure: LAPAROSCOPIC CHOLECYSTECTOMY WITH INTRAOPERATIVE CHOLANGIOGRAM;  Surgeon: Dia Crawford III, MD;  Location: [facility] ORS;  Service: General;  Laterality: N/A;   cyst removed      from lip as a child   INTRAMEDULLARY (IM) NAIL INTERTROCHANTERIC N/A 02/18/2019   Procedure: INTRAMEDULLARY (IM) NAIL INTERTROCHANTRIC, RIGHT,;  Surgeon: Thornton Park, MD;  Location: [facility] ORS;  Service: Orthopedics;  Laterality: N/A;   LUMBAR LAMINECTOMY/DECOMPRESSION MICRODISCECTOMY Bilateral 12/14/2012   Procedure: Bilateral lumbar three-four, four-five decompressive laminotomy/foraminotomy;  Surgeon: Charlie Pitter, MD;  Location: [place] [facility];  Service: Neurosurgery;  Laterality: Bilateral;   PULSE GENERATOR IMPLANT Bilateral 12/13/2013   Procedure: Bilateral implantable pulse generator placement;  Surgeon: Erline Levine, MD;  Location: [place] [facility];  Service: Neurosurgery;  Laterality: Bilateral;  Bilateral implantable pulse generator placement   skin cancer removed     from ears,   12 lft arm  rt leg 15   SUBTHALAMIC STIMULATOR BATTERY REPLACEMENT Bilateral 07/14/2017   Procedure: BILATERAL IMPLANTED PULSE GENERATOR CHANGE FOR DEEP BRAIN STIMULATOR;  Surgeon: Erline Levine, MD;  Location: [place];  Service: Neurosurgery;  Laterality: Bilateral;   SUBTHALAMIC STIMULATOR INSERTION Bilateral 12/06/2013   Procedure: SUBTHALAMIC STIMULATOR INSERTION;  Surgeon: Erline Levine, MD;  Location: [place] [facility] OR[facility];  Service: Neurosurgery;  Laterality: Bilateral;  Bilateral deep brain stimulator placement    Current Outpatient Medications  Medication Sig Dispense Refill   acetaminophen (TYLENOL) 325 MG tablet Take 650 mg by mouth every  6 (six) hours as needed.     aspirin EC 81 MG tablet Take 1 tablet (81 mg total)  by mouth daily. Swallow whole. 90 tablet 3   Carbidopa-Levodopa ER (RYTARY) 48.75-195 MG CPCR Take 2 capsules by mouth 3 (three) times daily.     cephALEXin (KEFLEX) 500 MG capsule Take 1 capsule (500 mg total) by mouth 3 (three) times daily. 21 capsule 0   cholecalciferol (VITAMIN D) 1000 UNITS tablet Take 1,000 Units by mouth daily.     Cranberry 200 MG CAPS Take by mouth.     diclofenac Sodium (VOLTAREN) 1 % GEL Apply topically 4 (four) times daily.     escitalopram (LEXAPRO) 20 MG tablet Take 1 tablet (20 mg total) by mouth daily. 90 tablet 1   fluticasone (FLONASE) 50 MCG/ACT nasal spray Place 2 sprays into both nostrils daily. 48 g 1   Melatonin 10 MG TABS Take 10 mg by mouth at bedtime.     memantine (NAMENDA) 10 MG tablet TAKE 1 TABLET TWICE A DAY 180 tablet 3   midodrine (PROAMATINE) 5 MG tablet Take 1 tablet (5 mg total) by mouth as needed. For orthostasis, drops in pressure <100 when standing 90 tablet 1   naproxen sodium (ALEVE) 220 MG tablet Take 220 mg by mouth.     Nystatin POWD Use twice a day if needed.     omeprazole (PRILOSEC) 20 MG capsule TAKE 1 CAPSULE DAILY 90 capsule 3   rivastigmine (EXELON) 9.5 mg/24hr 9.5 mg daily.     No current facility-administered medications for this visit.     Allergies:   Clonazepam, Morphine and related, Nuplazid [pimavanserin tartrate], Prednisone, and Tramadol   Social History:  The patient  reports that he has never smoked. He has quit using smokeless tobacco. He reports current alcohol use. He reports that he does not use drugs.   Family History:   family history includes Lung cancer in his father.    Review of Systems: Review of Systems  HENT: Negative.    Respiratory: Negative.    Cardiovascular: Negative.   Gastrointestinal: Negative.   Musculoskeletal:        Unsteady gait  Neurological:  Positive for weakness.  Psychiatric/Behavioral: Negative.    All other systems reviewed and are negative.   PHYSICAL EXAM: VS:   There were no vitals taken for this visit. , BMI There is no height or weight on file to calculate BMI. Constitutional:  oriented to person, place, and time. No distress.  HENT:  Head: Grossly normal Eyes:  no discharge. No scleral icterus.  Neck: No JVD, no carotid bruits  Cardiovascular: Regular rate and rhythm, no murmurs appreciated Pulmonary/Chest: Clear to auscultation bilaterally, no wheezes or rails Abdominal: Soft.  no distension.  no tenderness.  Musculoskeletal: Normal range of motion Neurological:  normal muscle tone. Coordination normal. No atrophy Skin: Skin warm and dry Psychiatric: normal affect, pleasant   Recent Labs: 12/22/2021: BUN 16; Creatinine, Ser 0.80; Hemoglobin 12.8; Platelets 204; Potassium 3.8; Sodium 140    Lipid Panel Lab Results  Component Value Date   CHOL 182 11/06/2020   HDL 37.10 (L) 11/06/2020   LDLCALC 109 (H) 11/06/2020   TRIG 182.0 (H) 11/06/2020    Wt Readings from Last 3 Encounters:  12/22/21 167 lb (75.8 kg)  10/07/21 180 lb (81.6 kg)  09/05/21 180 lb (81.6 kg)     ASSESSMENT AND PLAN:  Mixed hyperlipidemia  Cholesterol is at goal on the current lipid regimen. No changes  to the medications were made.  Essential hypertension - Stable today, running low at home, orthostasis, Poor fluid intake  Spinal stenosis, lumbar region, with neurogenic claudication -  Chronic back pain Stimulator in place, on right  Bradycardia Followed by EP, pacer  may 2022 More energy per the wife  Orthostasis Wife has documented drops in rpessuire, difficult time working with PT secondary to blood pressure drops Midodrine 5 mg as needed for pressure <100 Recommend she continue to monitor orthostasis numbers and use midodrine as directed  Total encounter time more than 25 minutes Greater than 50% was spent in counseling and coordination of care with the patient     No orders of the defined types were placed in this  encounter.    Signed, Esmond Plants, M.D., Ph.D. 12/23/2021  [place], [place]

## 2021-12-24 ENCOUNTER — Encounter: Payer: Self-pay | Admitting: Cardiovascular Disease

## 2021-12-24 ENCOUNTER — Ambulatory Visit: Payer: Medicare PPO | Attending: Cardiovascular Disease | Admitting: Cardiovascular Disease

## 2021-12-27 ENCOUNTER — Ambulatory Visit: Payer: Medicare PPO | Admitting: Physical Therapy

## 2021-12-27 ENCOUNTER — Ambulatory Visit: Payer: Medicare PPO

## 2021-12-28 ENCOUNTER — Ambulatory Visit (INDEPENDENT_AMBULATORY_CARE_PROVIDER_SITE_OTHER): Payer: Medicare PPO | Admitting: Psychology

## 2021-12-28 DIAGNOSIS — F331 Major depressive disorder, recurrent, moderate: Secondary | ICD-10-CM | POA: Diagnosis not present

## 2021-12-28 DIAGNOSIS — F4323 Adjustment disorder with mixed anxiety and depressed mood: Secondary | ICD-10-CM | POA: Diagnosis not present

## 2021-12-29 ENCOUNTER — Ambulatory Visit: Payer: Medicare PPO | Admitting: Physical Therapy

## 2021-12-29 NOTE — Progress Notes (Signed)
[facility] Counselor/Therapist Progress Note  Patient ID: [name], MRN: [id number],    Date: 12/28/2021  Time Spent: 55 minutes  Treatment Type: Indivdualwith family present  Reported Symptoms: sadness, and boredom  Mental Status Exam: Appearance:  Casual     Behavior: Drowsy  Motor: Psychomotor Retardation  Speech/Language:  Slow  Affect: flat  Mood: normal  Thought process: concrete  Thought content:   WNL  Sensory/Perceptual disturbances:   WNL  Orientation: oriented to person, place, time/date, and situation  Attention: Fair  Concentration: Fair  Memory: Immediate;   Fair  Progress Energy of knowledge:  Fair  Insight:   Fair  Judgment:  Fair  Impulse Control: Fair   Risk Assessment: Danger to Self:  No Self-injurious Behavior: No Danger to Others: No Duty to Warn:no Physical Aggression / Violence:No  Access to Firearms a concern: No  Gang Involvement:No   Subjective: The patient attended a face-to-face individual therapy session with wife present via video visit today.  The patient gave verbal consent for the video to be on WebEx.  The patient was in his home  and therapist was in the office.  The patient presents with a flat affect and mood is pleasant.  The patient and his wife report that he was in the hospital for about 12 days.  The patient had pancreatitis and was treated with heavy antibiotics for that.  His wife reports that a psychiatrist had met with them and one of them had recommended more therapy and the other did not.  I explained earlier to her that I was concerned about trying to do too much therapy with him as he has some issues with cognition.  She reports that he has said that he feels like the therapy is helping him and he wants to continue it because he feels comfortable working with me.  I will continue to provide supportive therapy with patient and interaction with patient's wife as needed to help her understand more about how to manage  his illness.  We will continue to meet every 3 weeks to provide supportive therapy.    Interventions: Ego-Supportive, CBT  Diagnosis:Major depressive disorder, recurrent episode, moderate (HCC)  Adjustment disorder with mixed anxiety and depressed mood  Plan: Client Abilities/Strengths  Pt is bright and has a good sense of humor.  Client Treatment Preferences  Individual therapy.  Client Statement of Needs  improve coping skills regarding chronic illness.  Treatment Level  Outpatient Individual therapy  Symptoms  Depressed or irritable mood.: No Description Entered (Status: improved). Diminished interest in or  enjoyment of activities.: No Description Entered (Status: maintained). Feelings of hopelessness,  worthlessness, or inappropriate guilt.: No Description Entered (Status: improved). Lack of energy.: No  Description Entered (Status: maintained). Psychomotor agitation or retardation.: No Description  Entered (Status: maintained). Social withdrawal.: No Description Entered (Status: improved).  Problems Addressed  Unipolar Depression, Unipolar Depression, Unipolar Depression, Unipolar Depression  Goals 1. Alleviate depressive symptoms and return to previous level of effective  functioning. 2. Develop healthy interpersonal relationships that lead to the alleviation  and help prevent the relapse of depression. Objective Describe current and past experiences with depression including their impact on functioning and  attempts to resolve it. Target Date: 2022-04-30 Frequency: Monthly Progress: 60 Modality: individual Related Interventions 1. Encourage the client to share his/her thoughts and feelings of depression; express empathy and  build rapport while identifying primary cognitive, behavioral, interpersonal, or other  contributors to depression. Objective Learn and implement behavioral  strategies to overcome depression. Target Date: 2022-04-30 Frequency: Monthly Progress:  80 Modality: individual Objective Identify and replace thoughts and beliefs that support depression. Target Date: 2022-04-30 Frequency: Monthly Progress: 70 Modality: individual Related Interventions 1. Conduct Cognitive-Behavioral Therapy (see Cognitive Behavior Therapy by Olevia Bowens; Overcoming Depression by Lynita Lombard al.), beginning with helping the client learn the connection among  cognition, depressive feelings, and actions. 2. Explore and restructure underlying assumptions and beliefs reflected in biased self-talk that  may put the client at risk for relapse or recurrence. 3. Facilitate and reinforce the client's shift from biased depressive self-talk and beliefs to realitybased cognitive messages that enhance self-confidence and increase adaptive actions (see  "Positive Self-Talk" in the Adult Psychotherapy Homework Planner by Bryn Gulling). Objective Learn and implement problem-solving and decision-making skills. Target Date: 2022-04-30 Frequency: Monthly Progress: 70 Modality: individual Related Interventions 1. Encourage in the client the development of a positive problem orientation in which problems  and solving them are viewed as a natural part of life and not something to be feared, despaired,  or avoided. 2. Conduct Problem-Solving Therapy (see Problem-Solving Therapy by Shawnee Knapp and Delane Ginger) using techniques such as psychoeducation, modeling, and role-playing to teach client problem-solving skills (i.e., defining a problem specifically, generating possible solutions, evaluating the pros  and cons of each solution, selecting and implementing a plan of action, evaluating the efficacy  of the plan, accepting or revising the plan); role-play application of the problem-solving skill to  a real life issue (or assign "Applying Problem-Solving to Interpersonal Conflict" in the Adult  Psychotherapy Homework Planner by Bryn Gulling). 3. Develop healthy thinking patterns and beliefs about self,  others, and the world that lead to the alleviation and help prevent the relapse of  depression. 4. Recognize, accept, and cope with feelings of depression. Diagnosis Axis  none F43.21 (Adjustment Disorder, With depressed mood) - Open -  [Signifier: n/a]  Adjustment Disorder,  With Depressed Mood  Axis  none 296.32 (Major depressive affective disorder, recurrent episode,  moderate) - Open - [Signifier: n/a]  Conditions For Discharge Achievement of treatment goals and objectives   Plan is to continue to offer supportive therapy to patient to help him deal with his depression related to his Parkinsons diagnosis.  Patient approved Treatment Plan   Kain Milosevic G Zoiey Christy, LCSW                                                                                                                                         Batina Dougan G Aivy Akter, LCSW

## 2021-12-30 NOTE — Progress Notes (Signed)
Remote pacemaker transmission.   

## 2022-01-03 ENCOUNTER — Ambulatory Visit: Payer: Medicare PPO | Admitting: Physical Therapy

## 2022-01-03 DIAGNOSIS — H00029 Hordeolum internum unspecified eye, unspecified eyelid: Secondary | ICD-10-CM | POA: Diagnosis not present

## 2022-01-03 DIAGNOSIS — H04123 Dry eye syndrome of bilateral lacrimal glands: Secondary | ICD-10-CM | POA: Diagnosis not present

## 2022-01-03 DIAGNOSIS — H5111 Convergence insufficiency: Secondary | ICD-10-CM | POA: Diagnosis not present

## 2022-01-03 DIAGNOSIS — G20 Parkinson's disease: Secondary | ICD-10-CM | POA: Diagnosis not present

## 2022-01-05 ENCOUNTER — Ambulatory Visit: Payer: Medicare PPO | Admitting: Physical Therapy

## 2022-01-10 ENCOUNTER — Ambulatory Visit: Payer: Medicare PPO | Admitting: Physical Therapy

## 2022-01-12 ENCOUNTER — Ambulatory Visit: Payer: Medicare PPO | Admitting: Physical Therapy

## 2022-01-17 ENCOUNTER — Ambulatory Visit: Payer: Medicare PPO | Admitting: Physical Therapy

## 2022-01-18 ENCOUNTER — Ambulatory Visit: Payer: Medicare PPO | Admitting: Psychology

## 2022-01-19 ENCOUNTER — Ambulatory Visit: Payer: Medicare PPO | Admitting: Physical Therapy

## 2022-01-19 ENCOUNTER — Ambulatory Visit: Payer: Medicare PPO

## 2022-01-21 ENCOUNTER — Other Ambulatory Visit: Payer: Self-pay | Admitting: Family Medicine

## 2022-01-24 ENCOUNTER — Ambulatory Visit: Payer: Medicare PPO | Admitting: Physical Therapy

## 2022-01-26 ENCOUNTER — Ambulatory Visit: Payer: Medicare PPO

## 2022-01-26 ENCOUNTER — Other Ambulatory Visit: Payer: Self-pay | Admitting: Family Medicine

## 2022-01-31 ENCOUNTER — Ambulatory Visit: Payer: Medicare PPO | Admitting: Physical Therapy

## 2022-02-02 ENCOUNTER — Ambulatory Visit: Payer: Medicare PPO | Admitting: Physical Therapy

## 2022-02-02 ENCOUNTER — Encounter: Payer: Self-pay | Admitting: Cardiology

## 2022-02-02 ENCOUNTER — Ambulatory Visit: Payer: Medicare PPO | Attending: Cardiology | Admitting: Cardiology

## 2022-02-02 VITALS — BP 114/62 | HR 73 | Ht 69.0 in

## 2022-02-02 DIAGNOSIS — G20A1 Parkinson's disease without dyskinesia, without mention of fluctuations: Secondary | ICD-10-CM

## 2022-02-02 DIAGNOSIS — I495 Sick sinus syndrome: Secondary | ICD-10-CM | POA: Diagnosis not present

## 2022-02-02 DIAGNOSIS — Z950 Presence of cardiac pacemaker: Secondary | ICD-10-CM | POA: Diagnosis not present

## 2022-02-02 LAB — CUP PACEART INCLINIC DEVICE CHECK
Battery Remaining Longevity: 151 mo
Battery Voltage: 3.04 V
Brady Statistic AP VP Percent: 0.05 %
Brady Statistic AP VS Percent: 97.99 %
Brady Statistic AS VP Percent: 0 %
Brady Statistic AS VS Percent: 1.95 %
Brady Statistic RA Percent Paced: 98.04 %
Brady Statistic RV Percent Paced: 0.05 %
Date Time Interrogation Session: 20231018162348
Implantable Lead Implant Date: 20220506
Implantable Lead Implant Date: 20220506
Implantable Lead Location: 753859
Implantable Lead Location: 753860
Implantable Lead Model: 5076
Implantable Lead Model: 5076
Implantable Pulse Generator Implant Date: 20220506
Lead Channel Impedance Value: 361 Ohm
Lead Channel Impedance Value: 361 Ohm
Lead Channel Impedance Value: 437 Ohm
Lead Channel Impedance Value: 589 Ohm
Lead Channel Pacing Threshold Amplitude: 0.75 V
Lead Channel Pacing Threshold Amplitude: 0.75 V
Lead Channel Pacing Threshold Pulse Width: 0.4 ms
Lead Channel Pacing Threshold Pulse Width: 0.4 ms
Lead Channel Sensing Intrinsic Amplitude: 10.5 mV
Lead Channel Sensing Intrinsic Amplitude: 2.5 mV
Lead Channel Sensing Intrinsic Amplitude: 2.75 mV
Lead Channel Sensing Intrinsic Amplitude: 7.75 mV
Lead Channel Setting Pacing Amplitude: 1.5 V
Lead Channel Setting Pacing Amplitude: 2 V
Lead Channel Setting Pacing Pulse Width: 0.4 ms
Lead Channel Setting Sensing Sensitivity: 1.2 mV

## 2022-02-02 NOTE — Progress Notes (Signed)
Electrophysiology Office Follow up Visit Note:    Date:  02/02/2022   ID:  [name], DOB 03/16/1951, MRN [id number]  PCP:  Tonia Ghent, MD  [facility] Cardiologist:  Ida Rogue, MD  [facility] Electrophysiologist:  Vickie Epley, MD    Interval History:    [name] is a 71 y.o. male who presents for a follow up visit. They were last seen in clinic November 25, 2020 for his tachycardia bradycardia syndrome and pacemaker.  He has been doing well from a tachybradycardia perspective.  His pacemaker is functioning well.  He is with his wife today in clinic wife previously met.       Past Medical History:  Diagnosis Date   Arthritis    Bradycardia    Cancer ([place]) 2013   skin cancer   Depression    ptsd   Dysrhythmia    chronic slow heart rate   GERD (gastroesophageal reflux disease)    Headache(784.0)    tension headaches non recent   History of chicken pox    History of kidney stones    passed   Hypertension    treated with HCTZ   Pacemaker    Parkinson's disease    dx'ed 15 years ago   PTSD (post-traumatic stress disorder)    Shortness of breath dyspnea    Sleep apnea    doesn't use C-pap   Varicose veins     Past Surgical History:  Procedure Laterality Date   CHOLECYSTECTOMY N/A 10/22/2014   Procedure: LAPAROSCOPIC CHOLECYSTECTOMY WITH INTRAOPERATIVE CHOLANGIOGRAM;  Surgeon: Dia Crawford III, MD;  Location: [facility] ORS;  Service: General;  Laterality: N/A;   cyst removed      from lip as a child   INTRAMEDULLARY (IM) NAIL INTERTROCHANTERIC N/A 02/18/2019   Procedure: INTRAMEDULLARY (IM) NAIL INTERTROCHANTRIC, RIGHT,;  Surgeon: Thornton Park, MD;  Location: [facility] ORS;  Service: Orthopedics;  Laterality: N/A;   LUMBAR LAMINECTOMY/DECOMPRESSION MICRODISCECTOMY Bilateral 12/14/2012   Procedure: Bilateral lumbar three-four, four-five decompressive laminotomy/foraminotomy;  Surgeon: Charlie Pitter, MD;  Location: [facility]URO ORS;  Service:  Neurosurgery;  Laterality: Bilateral;   PULSE GENERATOR IMPLANT Bilateral 12/13/2013   Procedure: Bilateral implantable pulse generator placement;  Surgeon: Erline Levine, MD;  Location: [place] NEURO ORS;  Service: Neurosurgery;  Laterality: Bilateral;  Bilateral implantable pulse generator placement   skin cancer removed     from ears,   12 lft arm  rt leg 15   SUBTHALAMIC STIMULATOR BATTERY REPLACEMENT Bilateral 07/14/2017   Procedure: BILATERAL IMPLANTED PULSE GENERATOR CHANGE FOR DEEP BRAIN STIMULATOR;  Surgeon: Erline Levine, MD;  Location: [place];  Service: Neurosurgery;  Laterality: Bilateral;   SUBTHALAMIC STIMULATOR INSERTION Bilateral 12/06/2013   Procedure: SUBTHALAMIC STIMULATOR INSERTION;  Surgeon: Erline Levine, MD;  Location: [place] NEURO ORS;  Service: Neurosurgery;  Laterality: Bilateral;  Bilateral deep brain stimulator placement    Current Medications: Current Meds  Medication Sig   acetaminophen (TYLENOL) 325 MG tablet Take 650 mg by mouth every 6 (six) hours as needed.   aspirin EC 81 MG tablet Take 1 tablet (81 mg total) by mouth daily. Swallow whole.   Carbidopa-Levodopa ER (RYTARY) 48.75-195 MG CPCR Take 2 capsules by mouth 3 (three) times daily.   cephALEXin (KEFLEX) 500 MG capsule Take 1 capsule (500 mg total) by mouth 3 (three) times daily.   cholecalciferol (VITAMIN D) 1000 UNITS tablet Take 1,000 Units by mouth daily.   Cranberry 200 MG CAPS Take by mouth.   cycloSPORINE (  RESTASIS) 0.05 % ophthalmic emulsion SMARTSIG:In Eye(s)   diclofenac Sodium (VOLTAREN) 1 % GEL Apply topically 4 (four) times daily.   escitalopram (LEXAPRO) 20 MG tablet TAKE 1 TABLET DAILY   fluticasone (FLONASE) 50 MCG/ACT nasal spray Place 2 sprays into both nostrils daily.   Melatonin 10 MG TABS Take 15 mg by mouth at bedtime.   memantine (NAMENDA) 10 MG tablet TAKE 1 TABLET TWICE A DAY   midodrine (PROAMATINE) 5 MG tablet Take 1 tablet (5 mg total) by mouth as needed. For orthostasis, drops in pressure  <100 when standing   Nystatin POWD Use twice a day if needed.   omeprazole (PRILOSEC) 20 MG capsule TAKE 1 CAPSULE DAILY   rivastigmine (EXELON) 9.5 mg/24hr 9.5 mg daily.     Allergies:   Clonazepam, Morphine and related, Nuplazid [pimavanserin tartrate], Prednisone, and Tramadol   Social History   Socioeconomic History   Marital status: Married    Spouse name: Not on file   Number of children: Not on file   Years of education: Not on file   Highest education level: Not on file  Occupational History   Occupation: disabled    Comment: 2001, [place]    Comment: search and rescue helicopter  Tobacco Use   Smoking status: Never   Smokeless tobacco: Former  Building services engineer Use: Never used  Substance and Sexual Activity   Alcohol use: Yes    Alcohol/week: 0.0 standard drinks of alcohol    Comment: occasional (twice per month)   Drug use: No   Sexual activity: Not Currently  Other Topics Concern   Not on file  Social History Narrative   Previous [employer], 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  Prev search and rescue work   3 kids from prev relationship.    Married 2014   Social Determinants of Health   Financial Resource Strain: Low Risk  (07/22/2021)   Overall Financial Resource Strain (CARDIA)    Difficulty of Paying Living Expenses: Not hard at all  Food Insecurity: No Food Insecurity (07/22/2021)   Hunger Vital Sign    Worried About Running Out of Food in the Last Year: Never true    Ran Out of Food in the Last Year: Never true  Transportation Needs: Not on file  Physical Activity: Not on file  Stress: Not on file  Social Connections: Not on file     Family History: The patient's family history includes Lung cancer in his father. There is no history of Colon cancer or Prostate cancer.  ROS:   Please see the history of present illness.    All other systems reviewed and are negative.  EKGs/Labs/Other Studies Reviewed:    The following studies were reviewed  today:  February 02, 2022 in clinic device interrogation personally reviewed Battery and lead parameters stable No changes made today 1 HVR episode lasting less than 8 seconds reviewed and appears to be SVT  EKG:  The ekg ordered today demonstrates atrial paced, ventricular sensed rhythm.  Artifact secondary to deep brain stimulator  Recent Labs: 12/22/2021: BUN 16; Creatinine, Ser 0.80; Hemoglobin 12.8; Platelets 204; Potassium 3.8; Sodium 140  Recent Lipid Panel    Component Value Date/Time   CHOL 182 11/06/2020 0806   CHOL 185 06/03/2013 1051   TRIG 182.0 (H) 11/06/2020 0806   HDL 37.10 (L) 11/06/2020 0806   HDL 35 (L) 06/03/2013 1051   CHOLHDL 5 11/06/2020 0806   VLDL 36.4 11/06/2020 0806  LDLCALC 109 (H) 11/06/2020 0806   LDLCALC 127 (H) 06/03/2013 1051    Physical Exam:    VS:  BP 114/62   Pulse 73   Ht $R'5\' 9"'gC$  (1.753 m)   SpO2 96%   BMI 24.66 kg/m     Wt Readings from Last 3 Encounters:  12/22/21 167 lb (75.8 kg)  10/07/21 180 lb (81.6 kg)  09/05/21 180 lb (81.6 kg)     GEN:  Well nourished, well developed in no acute distress HEENT: Normal NECK: No JVD; No carotid bruits LYMPHATICS: No lymphadenopathy CARDIAC: RRR, no murmurs, rubs, gallops.  Pacemaker pocket well-healed RESPIRATORY:  Clear to auscultation without rales, wheezing or rhonchi  ABDOMEN: Soft, non-tender, non-distended MUSCULOSKELETAL:  No edema; No deformity  SKIN: Warm and dry NEUROLOGIC:  Alert and oriented.  Parkinsonian PSYCHIATRIC:  Normal affect        ASSESSMENT:    1. Tachycardia-bradycardia syndrome ([place])   2. Parkinson's disease, unspecified whether dyskinesia present, unspecified whether manifestations fluctuate   3. Pacemaker    PLAN:    In order of problems listed above:  #Tach tachybradycardia syndrome #Pacemaker in situ Device functioning appropriately.  Continue remote monitoring.  Follow-up 1 year or sooner as needed.    Medication Adjustments/Labs and Tests  Ordered: Current medicines are reviewed at length with the patient today.  Concerns regarding medicines are outlined above.  No orders of the defined types were placed in this encounter.  No orders of the defined types were placed in this encounter.    Signed, Lars Mage, MD, [facility], [facility] 02/02/2022 1:44 PM    Electrophysiology [facility]

## 2022-02-02 NOTE — Patient Instructions (Addendum)
Medication Instructions:  None  *If you need a refill on your cardiac medications before your next appointment, please call your pharmacy*   Lab Work: None  If you have labs (blood work) drawn today and your tests are completely normal, you will receive your results only by: [place] (if you have MyChart) OR A paper copy in the mail If you have any lab test that is abnormal or we need to change your treatment, we will call you to review the results.   Testing/Procedures: None    Follow-Up: At [facility], you and your health needs are our priority.  As part of our continuing mission to provide you with exceptional heart care, we have created designated Provider Care Teams.  These Care Teams include your primary Cardiologist (physician) and Advanced Practice Providers (APPs -  Physician Assistants and Nurse Practitioners) who all work together to provide you with the care you need, when you need it.  We recommend signing up for the patient portal called "MyChart".  Sign up information is provided on this After Visit Summary.  MyChart is used to connect with patients for Virtual Visits (Telemedicine).  Patients are able to view lab/test results, encounter notes, upcoming appointments, etc.  Non-urgent messages can be sent to your provider as well.   To learn more about what you can do with MyChart, go to [email or web address].    Your next appointment:   1 year  The format for your next appointment:   In Person  Provider:   You will see one of the following Advanced Practice Providers on your designated Care Team:   Murray Hodgkins, NP Christell Faith, PA-C Cadence Kathlen Mody, PA-C Gerrie Nordmann, NP      Other Instructions None   Important Information About Sugar

## 2022-02-03 NOTE — Addendum Note (Signed)
Addended by: Nestor Ramp on: 02/03/2022 09:09 AM   Modules accepted: Orders

## 2022-02-03 NOTE — Progress Notes (Deleted)
Cardiology Office Note  Date:  02/03/2022   ID:  [name], DOB November 20, 1950, MRN [id number]  PCP:  Tonia Ghent, MD   No chief complaint on file.  HPI:  [name] is a pleasant 71 -year-old male year-old gentleman with  Parkinson's for more than 10 years,  Hypertension, chronic bradycardia , pacemaker placement of deep brain stimulator August 2015 for tremor followup at the [facility] hyperlipidemia, depression, possible seizure disorder Long-standing history of heart rate 40-50 bpm and in general has been asymptomatic who presents for routine follow-up of his blood pressure and bradycardia, hypertension.   Last seen in clinic September 2022 Seen by EP yesterday, pacemaker functioning well  Most of the history provided by patient's wife and chart review, he is alert though relatively nonverbal but will respond to questions  pacemaker implant at [place] with Dr Roderic Palau and has done well since implant. "Color is better", good energy  walked around [place] using a shopping cart without significant limitation which is a big improvement for him.  06/2020: deep brain stimulator replaced, placed on the right Chronic back pain  New walker, more active  "BP dropping with standing" per patient's wife who presents with him today  Chronic fatigue  Lab work reviewed Total cho 140, LDL 76  EKG personally reviewed by myself on todays visit Shows paced rhythm  Other past medical history reviewed Ziopatch monitor showed episodes of supraventricular tachycardia superimposed on a baseline of bradycardia. Seen by EP 11/2019 Plan to repeat Zio in 6 months  CT scan of the head 05/29/2013 showing mild chronic inflammatory disease of the sinuses. This was done for right sided headache. Work done 05/29/2013 includes basic     PMH:   has a past medical history of Arthritis, Bradycardia, Cancer ([place]) (2013), Depression, Dysrhythmia, GERD (gastroesophageal reflux disease),  Headache(784.0), History of chicken pox, History of kidney stones, Hypertension, Pacemaker, Parkinson's disease, PTSD (post-traumatic stress disorder), Shortness of breath dyspnea, Sleep apnea, and Varicose veins.  PSH:    Past Surgical History:  Procedure Laterality Date   CHOLECYSTECTOMY N/A 10/22/2014   Procedure: LAPAROSCOPIC CHOLECYSTECTOMY WITH INTRAOPERATIVE CHOLANGIOGRAM;  Surgeon: Dia Crawford III, MD;  Location: [facility] ORS;  Service: General;  Laterality: N/A;   cyst removed      from lip as a child   INTRAMEDULLARY (IM) NAIL INTERTROCHANTERIC N/A 02/18/2019   Procedure: INTRAMEDULLARY (IM) NAIL INTERTROCHANTRIC, RIGHT,;  Surgeon: Thornton Park, MD;  Location: [facility] ORS;  Service: Orthopedics;  Laterality: N/A;   LUMBAR LAMINECTOMY/DECOMPRESSION MICRODISCECTOMY Bilateral 12/14/2012   Procedure: Bilateral lumbar three-four, four-five decompressive laminotomy/foraminotomy;  Surgeon: Charlie Pitter, MD;  Location: [place] [facility]URO ORS;  Service: Neurosurgery;  Laterality: Bilateral;   PULSE GENERATOR IMPLANT Bilateral 12/13/2013   Procedure: Bilateral implantable pulse generator placement;  Surgeon: Erline Levine, MD;  Location: [place] NEURO ORS;  Service: Neurosurgery;  Laterality: Bilateral;  Bilateral implantable pulse generator placement   skin cancer removed     from ears,   12 lft arm  rt leg 15   SUBTHALAMIC STIMULATOR BATTERY REPLACEMENT Bilateral 07/14/2017   Procedure: BILATERAL IMPLANTED PULSE GENERATOR CHANGE FOR DEEP BRAIN STIMULATOR;  Surgeon: Erline Levine, MD;  Location: [place];  Service: Neurosurgery;  Laterality: Bilateral;   SUBTHALAMIC STIMULATOR INSERTION Bilateral 12/06/2013   Procedure: SUBTHALAMIC STIMULATOR INSERTION;  Surgeon: Erline Levine, MD;  Location: [place] NEURO ORS;  Service: Neurosurgery;  Laterality: Bilateral;  Bilateral deep brain stimulator placement    Current Outpatient Medications  Medication Sig Dispense Refill  acetaminophen (TYLENOL) 325 MG tablet Take 650 mg by mouth  every 6 (six) hours as needed.     aspirin EC 81 MG tablet Take 1 tablet (81 mg total) by mouth daily. Swallow whole. 90 tablet 3   Carbidopa-Levodopa ER (RYTARY) 48.75-195 MG CPCR Take 2 capsules by mouth 3 (three) times daily.     cephALEXin (KEFLEX) 500 MG capsule Take 1 capsule (500 mg total) by mouth 3 (three) times daily. 21 capsule 0   cholecalciferol (VITAMIN D) 1000 UNITS tablet Take 1,000 Units by mouth daily.     Cranberry 200 MG CAPS Take by mouth.     cycloSPORINE (RESTASIS) 0.05 % ophthalmic emulsion SMARTSIG:In Eye(s)     diclofenac Sodium (VOLTAREN) 1 % GEL Apply topically 4 (four) times daily.     escitalopram (LEXAPRO) 20 MG tablet TAKE 1 TABLET DAILY 90 tablet 0   fluticasone (FLONASE) 50 MCG/ACT nasal spray Place 2 sprays into both nostrils daily. 48 g 1   Melatonin 10 MG TABS Take 15 mg by mouth at bedtime.     memantine (NAMENDA) 10 MG tablet TAKE 1 TABLET TWICE A DAY 180 tablet 3   midodrine (PROAMATINE) 5 MG tablet Take 1 tablet (5 mg total) by mouth as needed. For orthostasis, drops in pressure <100 when standing 90 tablet 1   Nystatin POWD Use twice a day if needed.     omeprazole (PRILOSEC) 20 MG capsule TAKE 1 CAPSULE DAILY 90 capsule 3   rivastigmine (EXELON) 9.5 mg/24hr 9.5 mg daily.     No current facility-administered medications for this visit.     Allergies:   Clonazepam, Morphine and related, Nuplazid [pimavanserin tartrate], Prednisone, and Tramadol   Social History:  The patient  reports that he has never smoked. He has quit using smokeless tobacco. He reports current alcohol use. He reports that he does not use drugs.   Family History:   family history includes Lung cancer in his father.    Review of Systems: Review of Systems  HENT: Negative.    Respiratory: Negative.    Cardiovascular: Negative.   Gastrointestinal: Negative.   Musculoskeletal:        Unsteady gait  Neurological:  Positive for weakness.  Psychiatric/Behavioral: Negative.     All other systems reviewed and are negative.   PHYSICAL EXAM: VS:  There were no vitals taken for this visit. , BMI There is no height or weight on file to calculate BMI. Constitutional:  oriented to person, place, and time. No distress.  HENT:  Head: Grossly normal Eyes:  no discharge. No scleral icterus.  Neck: No JVD, no carotid bruits  Cardiovascular: Regular rate and rhythm, no murmurs appreciated Pulmonary/Chest: Clear to auscultation bilaterally, no wheezes or rails Abdominal: Soft.  no distension.  no tenderness.  Musculoskeletal: Normal range of motion Neurological:  normal muscle tone. Coordination normal. No atrophy Skin: Skin warm and dry Psychiatric: normal affect, pleasant   Recent Labs: 12/22/2021: BUN 16; Creatinine, Ser 0.80; Hemoglobin 12.8; Platelets 204; Potassium 3.8; Sodium 140    Lipid Panel Lab Results  Component Value Date   CHOL 182 11/06/2020   HDL 37.10 (L) 11/06/2020   LDLCALC 109 (H) 11/06/2020   TRIG 182.0 (H) 11/06/2020    Wt Readings from Last 3 Encounters:  12/22/21 167 lb (75.8 kg)  10/07/21 180 lb (81.6 kg)  09/05/21 180 lb (81.6 kg)     ASSESSMENT AND PLAN:  Mixed hyperlipidemia  Cholesterol is at goal on the current lipid  regimen. No changes to the medications were made.  Essential hypertension - Stable today, running low at home, orthostasis, Poor fluid intake  Spinal stenosis, lumbar region, with neurogenic claudication -  Chronic back pain Stimulator in place, on right  Bradycardia Followed by EP, pacer  may 2022 More energy per the wife  Orthostasis Wife has documented drops in rpessuire, difficult time working with PT secondary to blood pressure drops Midodrine 5 mg as needed for pressure <100 Recommend she continue to monitor orthostasis numbers and use midodrine as directed  Total encounter time more than 25 minutes Greater than 50% was spent in counseling and coordination of care with the patient     No  orders of the defined types were placed in this encounter.    Signed, Esmond Plants, M.D., Ph.D. 02/03/2022  [place], [place]

## 2022-02-04 ENCOUNTER — Encounter: Payer: Self-pay | Admitting: Cardiovascular Disease

## 2022-02-04 ENCOUNTER — Ambulatory Visit: Payer: Medicare PPO | Attending: Cardiovascular Disease | Admitting: Cardiovascular Disease

## 2022-02-04 DIAGNOSIS — I951 Orthostatic hypotension: Secondary | ICD-10-CM

## 2022-02-04 DIAGNOSIS — G20A1 Parkinson's disease without dyskinesia, without mention of fluctuations: Secondary | ICD-10-CM

## 2022-02-04 DIAGNOSIS — I4719 Other supraventricular tachycardia: Secondary | ICD-10-CM

## 2022-02-04 DIAGNOSIS — I77810 Thoracic aortic ectasia: Secondary | ICD-10-CM

## 2022-02-04 DIAGNOSIS — Z950 Presence of cardiac pacemaker: Secondary | ICD-10-CM

## 2022-02-04 DIAGNOSIS — I495 Sick sinus syndrome: Secondary | ICD-10-CM

## 2022-02-07 ENCOUNTER — Ambulatory Visit: Payer: Medicare PPO | Admitting: Physical Therapy

## 2022-02-08 ENCOUNTER — Ambulatory Visit (INDEPENDENT_AMBULATORY_CARE_PROVIDER_SITE_OTHER): Payer: Medicare PPO | Admitting: Psychology

## 2022-02-08 DIAGNOSIS — F331 Major depressive disorder, recurrent, moderate: Secondary | ICD-10-CM

## 2022-02-08 DIAGNOSIS — F4323 Adjustment disorder with mixed anxiety and depressed mood: Secondary | ICD-10-CM

## 2022-02-08 NOTE — Progress Notes (Signed)
[place] Counselor/Therapist Progress Note  Patient ID: [name], MRN: [id number],    Date: 02/08/2022  Time Spent: 50 minutes  Treatment Type: Indivdual  Reported Symptoms: sadness, and boredom  Mental Status Exam: Appearance:  Casual     Behavior: normal  Motor: Psychomotor Retardation  Speech/Language:  Slow  Affect: flat  Mood: normal  Thought process: concrete  Thought content:   WNL  Sensory/Perceptual disturbances:   WNL  Orientation: oriented to person, place, time/date, and situation  Attention: Fair  Concentration: Fair  Memory: Immediate;   [place] of knowledge:  Fair  Insight:   Fair  Judgment:  Fair  Impulse Control: Fair   Risk Assessment: Danger to Self:  No Self-injurious Behavior: No Danger to Others: No Duty to Warn:no Physical Aggression / Violence:No  Access to Firearms a concern: No  Gang Involvement:No   Subjective: The patient attended a face-to-face individual therapy session  via video visit today.  The patient gave verbal consent for the video to be on WebEx.  The patient was in his home  and therapist was in the office.  The patient presents with a flat affect and mood is pleasant.  The patient states that he has been doing better since I saw him last.  He denies feeling too depressed.  The patient reports that he has been singing at church the last few weeks and that seems to be going well.  The patient was more engaged in talk today about enjoying his cats at home.  The patient states that he lost one of his Parkinson's friends yesterday and the funeral was on Thursday.  He said it seems sad about it but he seems to be handling it okay.  Provided supportive therapy today.   Interventions: Ego-Supportive, CBT  Diagnosis:Major depressive disorder, recurrent episode, moderate (HCC)  Adjustment disorder with mixed anxiety and depressed mood  Plan: Client Abilities/Strengths  Pt is bright and has a good sense of  humor.  Client Treatment Preferences  Individual therapy.  Client Statement of Needs  improve coping skills regarding chronic illness.  Treatment Level  Outpatient Individual therapy  Symptoms  Depressed or irritable mood.: No Description Entered (Status: improved). Diminished interest in or  enjoyment of activities.: No Description Entered (Status: maintained). Feelings of hopelessness,  worthlessness, or inappropriate guilt.: No Description Entered (Status: improved). Lack of energy.: No  Description Entered (Status: maintained). Psychomotor agitation or retardation.: No Description  Entered (Status: maintained). Social withdrawal.: No Description Entered (Status: improved).  Problems Addressed  Unipolar Depression, Unipolar Depression, Unipolar Depression, Unipolar Depression  Goals 1. Alleviate depressive symptoms and return to previous level of effective  functioning. 2. Develop healthy interpersonal relationships that lead to the alleviation  and help prevent the relapse of depression. Objective Describe current and past experiences with depression including their impact on functioning and  attempts to resolve it. Target Date: 2022-04-30 Frequency: Monthly Progress: 60 Modality: individual Related Interventions 1. Encourage the client to share his/her thoughts and feelings of depression; express empathy and  build rapport while identifying primary cognitive, behavioral, interpersonal, or other  contributors to depression. Objective Learn and implement behavioral strategies to overcome depression. Target Date: 2022-04-30 Frequency: Monthly Progress: 80 Modality: individual Objective Identify and replace thoughts and beliefs that support depression. Target Date: 2022-04-30 Frequency: Monthly Progress: 70 Modality: individual Related Interventions 1. Conduct Cognitive-Behavioral Therapy (see Cognitive Behavior Therapy by Olevia Bowens; Overcoming Depression by Lynita Lombard al.),  beginning with helping the client learn  the connection among  cognition, depressive feelings, and actions. 2. Explore and restructure underlying assumptions and beliefs reflected in biased self-talk that  may put the client at risk for relapse or recurrence. 3. Facilitate and reinforce the client's shift from biased depressive self-talk and beliefs to realitybased cognitive messages that enhance self-confidence and increase adaptive actions (see  "Positive Self-Talk" in the Adult Psychotherapy Homework Planner by Bryn Gulling). Objective Learn and implement problem-solving and decision-making skills. Target Date: 2022-04-30 Frequency: Monthly Progress: 70 Modality: individual Related Interventions 1. Encourage in the client the development of a positive problem orientation in which problems  and solving them are viewed as a natural part of life and not something to be feared, despaired,  or avoided. 2. Conduct Problem-Solving Therapy (see Problem-Solving Therapy by Shawnee Knapp and Delane Ginger) using techniques such as psychoeducation, modeling, and role-playing to teach client problem-solving skills (i.e., defining a problem specifically, generating possible solutions, evaluating the pros  and cons of each solution, selecting and implementing a plan of action, evaluating the efficacy  of the plan, accepting or revising the plan); role-play application of the problem-solving skill to  a real life issue (or assign "Applying Problem-Solving to Interpersonal Conflict" in the Adult  Psychotherapy Homework Planner by Bryn Gulling). 3. Develop healthy thinking patterns and beliefs about self, others, and the world that lead to the alleviation and help prevent the relapse of  depression. 4. Recognize, accept, and cope with feelings of depression. Diagnosis Axis  none F43.21 (Adjustment Disorder, With depressed mood) - Open -  [Signifier: n/a]  Adjustment Disorder,  With Depressed Mood  Axis  none 296.32  (Major depressive affective disorder, recurrent episode,  moderate) - Open - [Signifier: n/a]  Conditions For Discharge Achievement of treatment goals and objectives   Plan is to continue to offer supportive therapy to patient to help him deal with his depression related to his Parkinsons diagnosis.  Patient approved Treatment Plan   Yandell Mcjunkins Rejeana Brock, LCSW

## 2022-02-09 ENCOUNTER — Ambulatory Visit: Payer: Medicare PPO

## 2022-02-14 ENCOUNTER — Ambulatory Visit: Payer: Medicare PPO | Admitting: Physical Therapy

## 2022-02-15 NOTE — Therapy (Incomplete)
OUTPATIENT PHYSICAL THERAPY NEURO TREATMENT   Patient Name: [name] MRN: [id number] DOB:1950/07/23, 71 y.o., male Today's Date: 02/15/2022   PCP: Dr. Elsie Stain REFERRING PROVIDER: Dr. Sharolyn Douglas     Past Medical History:  Diagnosis Date   Arthritis    Bradycardia    Cancer ([place]) 2013   skin cancer   Depression    ptsd   Dysrhythmia    chronic slow heart rate   GERD (gastroesophageal reflux disease)    Headache(784.0)    tension headaches non recent   History of chicken pox    History of kidney stones    passed   Hypertension    treated with HCTZ   Pacemaker    Parkinson's disease    dx'ed 15 years ago   PTSD (post-traumatic stress disorder)    Shortness of breath dyspnea    Sleep apnea    doesn't use C-pap   Varicose veins    Past Surgical History:  Procedure Laterality Date   CHOLECYSTECTOMY N/A 10/22/2014   Procedure: LAPAROSCOPIC CHOLECYSTECTOMY WITH INTRAOPERATIVE CHOLANGIOGRAM;  Surgeon: Dia Crawford III, MD;  Location: [facility]S;  Service: General;  Laterality: N/A;   cyst removed      from lip as a child   INTRAMEDULLARY (IM) NAIL INTERTROCHANTERIC N/A 02/18/2019   Procedure: INTRAMEDULLARY (IM) NAIL INTERTROCHANTRIC, RIGHT,;  Surgeon: Thornton Park, MD;  Location: [facility];  Service: Orthopedics;  Laterality: N/A;   LUMBAR LAMINECTOMY/DECOMPRESSION MICRODISCECTOMY Bilateral 12/14/2012   Procedure: Bilateral lumbar three-four, four-five decompressive laminotomy/foraminotomy;  Surgeon: Charlie Pitter, MD;  Location: [place] [facility];  Service: Neurosurgery;  Laterality: Bilateral;   PULSE GENERATOR IMPLANT Bilateral 12/13/2013   Procedure: Bilateral implantable pulse generator placement;  Surgeon: Erline Levine, MD;  Location: [place] [facility];  Service: Neurosurgery;  Laterality: Bilateral;  Bilateral implantable pulse generator placement   skin cancer removed     from ears,   12 lft arm  rt leg 15   SUBTHALAMIC STIMULATOR BATTERY REPLACEMENT Bilateral  07/14/2017   Procedure: BILATERAL IMPLANTED PULSE GENERATOR CHANGE FOR DEEP BRAIN STIMULATOR;  Surgeon: Erline Levine, MD;  Location: [place];  Service: Neurosurgery;  Laterality: Bilateral;   SUBTHALAMIC STIMULATOR INSERTION Bilateral 12/06/2013   Procedure: SUBTHALAMIC STIMULATOR INSERTION;  Surgeon: Erline Levine, MD;  Location: [place] [facility];  Service: Neurosurgery;  Laterality: Bilateral;  Bilateral deep brain stimulator placement   Patient Active Problem List   Diagnosis Date Noted   Hematuria 10/10/2021   Sinus drainage 12/09/2020   Medicare annual wellness visit, subsequent 11/18/2020   Pacemaker    Dementia associated with Parkinson's disease ([place]) 06/17/2020   Vertigo 02/19/2020   Urine abnormality 02/19/2020   Dysphagia 02/23/2019   Fall at home 10/18/2017   REM behavioral disorder 11/02/2016   Radicular pain in right arm 10/09/2016   GERD (gastroesophageal reflux disease) 07/31/2016   Colon cancer screening 07/23/2015   Gout 02/18/2015   Depression 01/06/2015   Skin lesion 10/02/2014   Dupuytren's contracture 08/20/2014   Cough 07/21/2014   Lumbar stenosis with neurogenic claudication 06/13/2014   S/P deep brain stimulator placement 05/08/2014   Advance care planning 01/19/2014   Parkinson's disease 12/13/2013   PTSD (post-traumatic stress disorder) 06/13/2013   Erectile dysfunction 06/13/2013   HLD (hyperlipidemia) 06/13/2013   Essential hypertension 06/03/2013   Bradycardia by electrocardiogram 06/03/2013   Obstructive sleep apnea 03/12/2013    ONSET DATE: 15 years ago per chart  REFERRING DIAG: Parkinson's disease  THERAPY DIAG:  No diagnosis found.  Rationale for Evaluation and Treatment Rehabilitation  SUBJECTIVE:                                                                                                                                                                                              SUBJECTIVE STATEMENT: ***  Pt accompanied by:  significant other  PERTINENT HISTORY:   Date of most recent Admission: 09/25/2021 Date of Discharge: 09/28/2021  Discharge Diagnoses:  Altered mental status Parkinson disease (CMS/HCC) Hallucinations  Per [facility] discharge summary notes PHI : Hospital Course:   72 y.o. male with a history of Parkinson's Disease w/ DBS in place, PD-related dementia, OSA, depression, and seizure disorder who presented for EEG given concern for seizures.  Altered mental status Concern seizure activity Periods of unresponsiveness on the setting of new medication--Nuplazid (pimavanserin) recently started. No events of note on continuous EEG. This was held and he returned to his baseline mental status. Other workup including CT head, CTA h/n without obvious cause for periodic unresponsiveness. Nuplazid was discontinued.   Aspiration, weakness Of note, he had a coughing spell while eating though sounds like was precipitated by sneezing. Did well on RA, seen by SLP without any further needs or concerns.  Parkinson's Disease Parkinson's-related dementia - He follows in the [facility] center [facility]. Clinic notes suggest he seems to have worsening of his overall disease trajectory. - Continued home Memantine and Sinemet      PMH: HTN, bradycardia s/p PPM, PD, DBS, COVID-19 infection, chronic low back pain s/p L4/5 surgery.  PAIN:  Are you having pain? Yes: NPRS scale: 7/10 Pain location: Low back Pain description: ache Aggravating factors: Prolonged standing/sitting, bending over Relieving factors: rest, changing positions  PRECAUTIONS: Fall  WEIGHT BEARING RESTRICTIONS No  FALLS: Has patient fallen in last 6 months? Yes. Number of falls >10  LIVING ENVIRONMENT: Lives with: lives with their spouse Lives in: House/apartment Stairs:  Ramp Has following equipment at home: Single point cane, Environmental consultant - 4 wheeled, Wheelchair (manual), shower chair, bed side commode, Grab bars, and Ramped  entry  PLOF: Needs assistance with ADLs, Needs assistance with homemaking, Needs assistance with gait, and Needs assistance with transfers  PATIENT GOALS To walk better and not fall  OBJECTIVE:   DIAGNOSTIC FINDINGS:   COGNITION: Overall cognitive status: History of cognitive impairments - at baseline   SENSATION: Light touch: WFL  COORDINATION: Decreased and slow to initiate  EDEMA:  None observed     POSTURE: rounded shoulders, forward head, increased thoracic kyphosis, and flexed trunk   LOWER EXTREMITY ROM:       Right Eval Left  Eval  Hip flexion    Hip extension    Hip abduction    Hip adduction    Hip internal rotation    Hip external rotation    Knee flexion    Knee extension    Ankle dorsiflexion    Ankle plantarflexion    Ankle inversion    Ankle eversion     (Blank rows = not tested)  LOWER EXTREMITY MMT:    MMT Right Eval Left Eval  Hip flexion 3+ 3+  Hip extension 3+ 3+  Hip abduction 3+  3+  Hip adduction 3+ 3+  Hip internal rotation 3+ 3+  Hip external rotation 3+ 3+  Knee flexion 3+_ 3+  Knee extension 3+ 3+  Ankle dorsiflexion 4 4  Ankle plantarflexion 4 4  Ankle inversion    Ankle eversion    (Blank rows = not tested)    TRANSFERS: Assistive device utilized:  Upright 4WW   Sit to stand:  Min/mod assist- with increased VC for safety Stand to sit:  Min assist with Verbal cues to lock brakes and reach back Chair to chair: Min A     GAIT: Gait pattern: decreased arm swing- Right, decreased arm swing- Left, decreased step length- Right, decreased step length- Left, decreased stance time- Right, decreased stance time- Left, decreased stride length, and shuffling Distance walked: 150 ft. Assistive device utilized:  upright 4WW Level of assistance: Min A Comments: Shuffling gait with slow gait speed  FUNCTIONAL TESTs:  5 times sit to stand: 31.0 sec with BUE support Timed up and go (TUG): 53.56 sec with upright 4WW 6  minute walk test: 330 feet using upright 4WW 10 meter walk test: 0.4 m/s  PATIENT SURVEYS:  FOTO 50  TODAY'S TREATMENT:   In // bars: -forward/backwards ambulation -lateral stepping -step over line and clap, step back 10x each LE   Sit to stands   PATIENT EDUCATION: Education details: PT plan of care; purpose of functional outcomes Person educated: Patient and Spouse Education method: Explanation and Verbal cues Education comprehension: verbalized understanding   HOME EXERCISE PROGRAM: To be reviewed and progressed next 1-2 visits    GOALS: Goals reviewed with patient? Yes  SHORT TERM GOALS: Target date: 01/11/2022  Pt will be independent with HEP in order to improve strength and balance in order to decrease fall risk and improve function at home and work.  Baseline: Patient reports not performing much in the way of any HEP except walking. Goal status: INITIAL  LONG TERM GOALS: Target date: 02/22/2022  Pt will decrease 5TSTS by at least 5 seconds in order to demonstrate clinically significant improvement in LE strength. Baseline: 11/30/2021= 31.0 sec with B UE Support Goal status: INITIAL  2.  Pt will improve FOTO to target score of 51 to display perceived improvements in ability to complete ADL's.  Baseline: 11/30/2021=50 Goal status: INITIAL  3.  Pt will decrease TUG to < or = to 38 seconds/decrease in order to demonstrate decreased fall risk. Baseline: 11/30/2021= 53.56 sec using upright 4WW Goal status: INITIAL  4.  Pt will increase 10MWT by at least 0.13 m/s in order to demonstrate clinically significant improvement in community ambulation.   Baseline: 11/30/2021= 0.4 m/s using upright 4WW Goal status: INITIAL  5.  Patient will increase six minute walk test distance to >1000 for progression to community ambulator and improve gait ability  Baseline: 11/30/2021= 330 feet Goal status: IN PROGRESS  6.  Patient will experience 1 fall or less per  week for at least 3  weeks in order to improve his frequency of falls and prevent injury from falls  Baseline: 11/30/2021-Patient presents with recount of multiple recent falls including 1 yesterday. Goal status: IN PROGRESS  ASSESSMENT:  CLINICAL IMPRESSION: ***Pt will continue to benefit from skilled PT intervnention to iomprove his strength, balance, prevent falls, and improve QOL and caregiver burden.    OBJECTIVE IMPAIRMENTS Abnormal gait, decreased activity tolerance, decreased balance, decreased cognition, decreased coordination, decreased endurance, decreased mobility, difficulty walking, decreased ROM, decreased strength, decreased safety awareness, hypomobility, impaired perceived functional ability, and pain.   ACTIVITY LIMITATIONS carrying, lifting, bending, standing, squatting, stairs, transfers, bed mobility, bathing, toileting, dressing, self feeding, hygiene/grooming, and caring for others  PARTICIPATION LIMITATIONS: meal prep, cleaning, laundry, medication management, personal finances, driving, shopping, community activity, and yard work  PERSONAL FACTORS Time since onset of injury/illness/exacerbation are also affecting patient's functional outcome.   REHAB POTENTIAL: Fair    CLINICAL DECISION MAKING: Evolving/moderate complexity  EVALUATION COMPLEXITY: Moderate  PLAN: PT FREQUENCY: 2x/week  PT DURATION: 12 weeks  PLANNED INTERVENTIONS: Therapeutic exercises, Therapeutic activity, Neuromuscular re-education, Balance training, Gait training, Patient/Family education, Self Care, Joint mobilization, Joint manipulation, Stair training, Vestibular training, Canalith repositioning, DME instructions, Dry Needling, Electrical stimulation, Spinal manipulation, Spinal mobilization, Cryotherapy, Moist heat, Manual therapy, and Re-evaluation  PLAN FOR NEXT SESSION: Continue to test balance and focus on ROM, LE strengthening, Transfer training, and gait training.    Janna Arch, PT 02/15/2022,  4:24 PM

## 2022-02-16 ENCOUNTER — Ambulatory Visit: Payer: Medicare PPO

## 2022-02-21 ENCOUNTER — Ambulatory Visit: Payer: Medicare PPO

## 2022-02-21 ENCOUNTER — Ambulatory Visit (INDEPENDENT_AMBULATORY_CARE_PROVIDER_SITE_OTHER): Payer: Medicare PPO

## 2022-02-21 DIAGNOSIS — I495 Sick sinus syndrome: Secondary | ICD-10-CM | POA: Diagnosis not present

## 2022-02-21 NOTE — Therapy (Incomplete)
OUTPATIENT PHYSICAL THERAPY NEURO EVALUATION   Patient Name: [name] MRN: [id number] DOB:[birth year], 71 y.o., male Today's Date: 02/21/2022   PCP: Dr. Elsie Stain REFERRING PROVIDER: ***    Past Medical History:  Diagnosis Date   Arthritis    Bradycardia    Cancer ([place]) 2013   skin cancer   Depression    ptsd   Dysrhythmia    chronic slow heart rate   GERD (gastroesophageal reflux disease)    Headache(784.0)    tension headaches non recent   History of chicken pox    History of kidney stones    passed   Hypertension    treated with HCTZ   Pacemaker    Parkinson's disease    dx'ed 15 years ago   PTSD (post-traumatic stress disorder)    Shortness of breath dyspnea    Sleep apnea    doesn't use C-pap   Varicose veins    Past Surgical History:  Procedure Laterality Date   CHOLECYSTECTOMY N/A 10/22/2014   Procedure: LAPAROSCOPIC CHOLECYSTECTOMY WITH INTRAOPERATIVE CHOLANGIOGRAM;  Surgeon: Dia Crawford III, MD;  Location: [facility];  Service: General;  Laterality: N/A;   cyst removed      from lip as a child   INTRAMEDULLARY (IM) NAIL INTERTROCHANTERIC N/A 02/18/2019   Procedure: INTRAMEDULLARY (IM) NAIL INTERTROCHANTRIC, RIGHT,;  Surgeon: Thornton Park, MD;  Location: [facility];  Service: Orthopedics;  Laterality: N/A;   LUMBAR LAMINECTOMY/DECOMPRESSION MICRODISCECTOMY Bilateral 12/14/2012   Procedure: Bilateral lumbar three-four, four-five decompressive laminotomy/foraminotomy;  Surgeon: Charlie Pitter, MD;  Location: [place] [facility];  Service: Neurosurgery;  Laterality: Bilateral;   PULSE GENERATOR IMPLANT Bilateral 12/13/2013   Procedure: Bilateral implantable pulse generator placement;  Surgeon: Erline Levine, MD;  Location: [place] [facility];  Service: Neurosurgery;  Laterality: Bilateral;  Bilateral implantable pulse generator placement   skin cancer removed     from ears,   12 lft arm  rt leg 15   SUBTHALAMIC STIMULATOR BATTERY REPLACEMENT Bilateral 07/14/2017    Procedure: BILATERAL IMPLANTED PULSE GENERATOR CHANGE FOR DEEP BRAIN STIMULATOR;  Surgeon: Erline Levine, MD;  Location: [place];  Service: Neurosurgery;  Laterality: Bilateral;   SUBTHALAMIC STIMULATOR INSERTION Bilateral 12/06/2013   Procedure: SUBTHALAMIC STIMULATOR INSERTION;  Surgeon: Erline Levine, MD;  Location: [place] [facility];  Service: Neurosurgery;  Laterality: Bilateral;  Bilateral deep brain stimulator placement   Patient Active Problem List   Diagnosis Date Noted   Hematuria 10/10/2021   Sinus drainage 12/09/2020   Medicare annual wellness visit, subsequent 11/18/2020   Pacemaker    Dementia associated with Parkinson's disease ([place]) 06/17/2020   Vertigo 02/19/2020   Urine abnormality 02/19/2020   Dysphagia 02/23/2019   Fall at home 10/18/2017   REM behavioral disorder 11/02/2016   Radicular pain in right arm 10/09/2016   GERD (gastroesophageal reflux disease) 07/31/2016   Colon cancer screening 07/23/2015   Gout 02/18/2015   Depression 01/06/2015   Skin lesion 10/02/2014   Dupuytren's contracture 08/20/2014   Cough 07/21/2014   Lumbar stenosis with neurogenic claudication 06/13/2014   S/P deep brain stimulator placement 05/08/2014   Advance care planning 01/19/2014   Parkinson's disease 12/13/2013   PTSD (post-traumatic stress disorder) 06/13/2013   Erectile dysfunction 06/13/2013   HLD (hyperlipidemia) 06/13/2013   Essential hypertension 06/03/2013   Bradycardia by electrocardiogram 06/03/2013   Obstructive sleep apnea 03/12/2013    ONSET DATE: ***  REFERRING DIAG: ***  THERAPY DIAG:  No diagnosis found.  Rationale for Evaluation and Treatment: Rehabilitation  SUBJECTIVE:  SUBJECTIVE STATEMENT: *** Pt accompanied by: {accompnied:27141}  PERTINENT HISTORY: ***  PAIN:  Are  you having pain? {OPRCPAIN:27236}  PRECAUTIONS: {Therapy precautions:24002}  WEIGHT BEARING RESTRICTIONS: {Yes ***/No:24003}  FALLS: Has patient fallen in last 6 months? {fallsyesno:27318}  LIVING ENVIRONMENT: Lives with: lives with their spouse Lives in: House/apartment Stairs:  Ramped entrance Has following equipment at home: Single point cane, Environmental consultant - 4 wheeled, Wheelchair (manual), Grab bars, Ramped entry, and built in shower seat; Ustep walker, Upright walker  PLOF: Needs assistance with ADLs, Needs assistance with homemaking, and Needs assistance with gait  PATIENT GOALS: ***  OBJECTIVE:   DIAGNOSTIC FINDINGS: CLINICAL DATA:  Status post fall.  Neck trauma.   EXAM: CT HEAD WITHOUT CONTRAST   CT CERVICAL SPINE WITHOUT CONTRAST   TECHNIQUE: Multidetector CT imaging of the head and cervical spine was performed following the standard protocol without intravenous contrast. Multiplanar CT image reconstructions of the cervical spine were also generated.   RADIATION DOSE REDUCTION: This exam was performed according to the departmental dose-optimization program which includes automated exposure control, adjustment of the mA and/or kV according to patient size and/or use of iterative reconstruction technique.   COMPARISON:  10/02/2019   FINDINGS: CT HEAD FINDINGS   Brain: Deep brain stimulator again noted unchanged. No evidence of acute infarction, hemorrhage, extra-axial collection, ventriculomegaly, or mass effect. Generalized cerebral atrophy.   Vascular: Cerebrovascular atherosclerotic calcifications are noted. No hyperdense vessels.   Skull: Negative for fracture or focal lesion.   Sinuses/Orbits: Visualized portions of the orbits are unremarkable. Visualized portions of the paranasal sinuses are unremarkable. Visualized portions of the mastoid air cells are unremarkable.   Other: None.   CT CERVICAL SPINE FINDINGS   Alignment: Normal.   Skull base and  vertebrae: No acute fracture. No primary bone lesion or focal pathologic process.   Soft tissues and spinal canal: No prevertebral fluid or swelling. No visible canal hematoma.   Disc levels: Degenerative disease with disc height loss at C5-6 and C6-7. Bilateral uncovertebral degenerative changes at C6-7 with foraminal encroachment. Left foraminal narrowing at C5-6. Bilateral facet arthropathy at C3-4, C4-5 and C5-6.   Upper chest: Lung apices are clear.   Other: No fluid collection or hematoma.   IMPRESSION: 1. No acute intracranial pathology. 2. No acute fracture or subluxation of the cervical spine.     Electronically Signed   By: Kathreen Devoid M.D.   On: 12/22/2021 13:13  COGNITION: Overall cognitive status: {cognition:24006}   SENSATION: {sensation:27233}  COORDINATION: ***  EDEMA:  {edema:24020}  MUSCLE TONE: {LE tone:25568}  MUSCLE LENGTH: Hamstrings: Right *** deg; Left *** deg Marcello Moores test: Right *** deg; Left *** deg  DTRs:  {DTR SITE:24025}  POSTURE: {posture:25561}  LOWER EXTREMITY ROM:     {AROM/PROM:27142}  Right Eval Left Eval  Hip flexion    Hip extension    Hip abduction    Hip adduction    Hip internal rotation    Hip external rotation    Knee flexion    Knee extension    Ankle dorsiflexion    Ankle plantarflexion    Ankle inversion    Ankle eversion     (Blank rows = not tested)  LOWER EXTREMITY MMT:    MMT Right Eval Left Eval  Hip flexion    Hip extension    Hip abduction    Hip adduction    Hip internal rotation    Hip external rotation    Knee flexion    Knee extension  Ankle dorsiflexion    Ankle plantarflexion    Ankle inversion    Ankle eversion    (Blank rows = not tested)  BED MOBILITY:  {Bed mobility:24027}  TRANSFERS: Assistive device utilized: {Assistive devices:23999}  Sit to stand: {Levels of assistance:24026} Stand to sit: {Levels of assistance:24026} Chair to chair: {Levels of  assistance:24026} Floor: {Levels of assistance:24026}  RAMP:  Level of Assistance: {Levels of assistance:24026} Assistive device utilized: {Assistive devices:23999} Ramp Comments: ***  CURB:  Level of Assistance: {Levels of assistance:24026} Assistive device utilized: {Assistive devices:23999} Curb Comments: ***  STAIRS: Level of Assistance: {Levels of assistance:24026} Stair Negotiation Technique: {Stair Technique:27161} with {Rail Assistance:27162} Number of Stairs: ***  Height of Stairs: ***  Comments: ***  GAIT: Gait pattern: {gait characteristics:25376} Distance walked: *** Assistive device utilized: {Assistive devices:23999} Level of assistance: {Levels of assistance:24026} Comments: ***  FUNCTIONAL TESTS:  {Functional tests:24029}  PATIENT SURVEYS:  {rehab surveys:24030}  TODAY'S TREATMENT:                                                                                                                              DATE: ***    PATIENT EDUCATION: Education details: *** Person educated: {Person educated:25204} Education method: {Education Method:25205} Education comprehension: {Education Comprehension:25206}  HOME EXERCISE PROGRAM: ***  GOALS: Goals reviewed with patient? {yes/no:20286}  SHORT TERM GOALS: Target date: 04/04/2022  Pt will be independent with HEP in order to improve strength and balance in order to decrease fall risk and improve function at home and work.  Baseline: EVAL Goal status: INITIAL  2.  *** Baseline:  Goal status: {GOALSTATUS:25110}  3.  *** Baseline:  Goal status: {GOALSTATUS:25110}  4.  *** Baseline:  Goal status: {GOALSTATUS:25110}  5.  *** Baseline:  Goal status: {GOALSTATUS:25110}  6.  *** Baseline:  Goal status: {GOALSTATUS:25110}  LONG TERM GOALS: Target date: 05/16/2022  Pt will improve FOTO to target score of 51 to display perceived improvements in ability to complete ADL's.  Baseline: EVAL:  Goal  status: INITIAL  2.  Pt will decrease 5TSTS by at least 5 seconds in order to demonstrate clinically significant improvement in LE strength. Baseline: EVAL Goal status: INITIAL  3.  Pt will decrease TUG to < or = to 38 seconds/decrease in order to demonstrate decreased fall risk.  Baseline:  Goal status: INITIAL  4.   Patient will experience 1 fall or less per week for at least 3 weeks in order to improve his frequency of falls and prevent injury from falls   Baseline:  Goal status: INITIAL  5.  Patient will increase six minute walk test distance to >1000 for progression to community ambulator and improve gait ability   Baseline:  Goal status: INITIAL  6.  Pt will increase 10MWT by at least 0.13 m/s in order to demonstrate clinically significant improvement in community ambulation.    Baseline:  Goal status: INITIAL  ASSESSMENT:  CLINICAL IMPRESSION: Patient is a 71 y.o. ***  who was seen today for physical therapy evaluation and treatment for ***.   OBJECTIVE IMPAIRMENTS: {opptimpairments:25111}.   ACTIVITY LIMITATIONS: {activitylimitations:27494}  PARTICIPATION LIMITATIONS: {participationrestrictions:25113}  PERSONAL FACTORS: {Personal factors:25162} are also affecting patient's functional outcome.   REHAB POTENTIAL: {rehabpotential:25112}  CLINICAL DECISION MAKING: {clinical decision making:25114}  EVALUATION COMPLEXITY: {Evaluation complexity:25115}  PLAN:  PT FREQUENCY: {rehab frequency:25116}  PT DURATION: {rehab duration:25117}  PLANNED INTERVENTIONS: {rehab planned interventions:25118::"Therapeutic exercises","Therapeutic activity","Neuromuscular re-education","Balance training","Gait training","Patient/Family education","Self Care","Joint mobilization"}  PLAN FOR NEXT SESSION: ***   Lewis Moccasin, PT 02/21/2022, 8:09 AM

## 2022-02-23 ENCOUNTER — Ambulatory Visit: Payer: Medicare PPO | Admitting: Physical Therapy

## 2022-02-23 ENCOUNTER — Ambulatory Visit: Payer: Medicare PPO

## 2022-02-23 LAB — CUP PACEART REMOTE DEVICE CHECK
Battery Remaining Longevity: 150 mo
Battery Voltage: 3.04 V
Brady Statistic AP VP Percent: 0.03 %
Brady Statistic AP VS Percent: 99.01 %
Brady Statistic AS VP Percent: 0 %
Brady Statistic AS VS Percent: 0.95 %
Brady Statistic RA Percent Paced: 99.03 %
Brady Statistic RV Percent Paced: 0.04 %
Date Time Interrogation Session: 20231106003002
Implantable Lead Connection Status: 753985
Implantable Lead Connection Status: 753985
Implantable Lead Implant Date: 20220506
Implantable Lead Implant Date: 20220506
Implantable Lead Location: 753859
Implantable Lead Location: 753860
Implantable Lead Model: 5076
Implantable Lead Model: 5076
Implantable Pulse Generator Implant Date: 20220506
Lead Channel Impedance Value: 342 Ohm
Lead Channel Impedance Value: 361 Ohm
Lead Channel Impedance Value: 418 Ohm
Lead Channel Impedance Value: 551 Ohm
Lead Channel Pacing Threshold Amplitude: 0.625 V
Lead Channel Pacing Threshold Amplitude: 0.75 V
Lead Channel Pacing Threshold Pulse Width: 0.4 ms
Lead Channel Pacing Threshold Pulse Width: 0.4 ms
Lead Channel Sensing Intrinsic Amplitude: 2 mV
Lead Channel Sensing Intrinsic Amplitude: 2 mV
Lead Channel Sensing Intrinsic Amplitude: 8.125 mV
Lead Channel Sensing Intrinsic Amplitude: 8.125 mV
Lead Channel Setting Pacing Amplitude: 1.5 V
Lead Channel Setting Pacing Amplitude: 2 V
Lead Channel Setting Pacing Pulse Width: 0.4 ms
Lead Channel Setting Sensing Sensitivity: 1.2 mV
Zone Setting Status: 755011
Zone Setting Status: 755011

## 2022-02-24 DIAGNOSIS — F0390 Unspecified dementia without behavioral disturbance: Secondary | ICD-10-CM | POA: Diagnosis not present

## 2022-02-24 DIAGNOSIS — Z462 Encounter for fitting and adjustment of other devices related to nervous system and special senses: Secondary | ICD-10-CM | POA: Diagnosis not present

## 2022-02-24 DIAGNOSIS — Z9689 Presence of other specified functional implants: Secondary | ICD-10-CM | POA: Diagnosis not present

## 2022-02-24 DIAGNOSIS — R441 Visual hallucinations: Secondary | ICD-10-CM | POA: Diagnosis not present

## 2022-02-24 DIAGNOSIS — F0280 Dementia in other diseases classified elsewhere without behavioral disturbance: Secondary | ICD-10-CM | POA: Diagnosis not present

## 2022-02-24 DIAGNOSIS — G20A2 Parkinson's disease without dyskinesia, with fluctuations: Secondary | ICD-10-CM | POA: Diagnosis not present

## 2022-02-24 DIAGNOSIS — G20A1 Parkinson's disease without dyskinesia, without mention of fluctuations: Secondary | ICD-10-CM | POA: Diagnosis not present

## 2022-02-28 ENCOUNTER — Encounter: Payer: Self-pay | Admitting: Physical Therapy

## 2022-02-28 ENCOUNTER — Ambulatory Visit: Payer: Medicare PPO | Attending: Family Medicine

## 2022-02-28 ENCOUNTER — Ambulatory Visit: Payer: Medicare PPO | Admitting: Physical Therapy

## 2022-02-28 DIAGNOSIS — R296 Repeated falls: Secondary | ICD-10-CM | POA: Insufficient documentation

## 2022-02-28 DIAGNOSIS — R2681 Unsteadiness on feet: Secondary | ICD-10-CM | POA: Diagnosis not present

## 2022-02-28 DIAGNOSIS — G20B1 Parkinson's disease with dyskinesia, without mention of fluctuations: Secondary | ICD-10-CM | POA: Diagnosis not present

## 2022-02-28 DIAGNOSIS — R278 Other lack of coordination: Secondary | ICD-10-CM | POA: Diagnosis not present

## 2022-02-28 DIAGNOSIS — G20A1 Parkinson's disease without dyskinesia, without mention of fluctuations: Secondary | ICD-10-CM | POA: Insufficient documentation

## 2022-02-28 DIAGNOSIS — M6281 Muscle weakness (generalized): Secondary | ICD-10-CM | POA: Diagnosis not present

## 2022-02-28 DIAGNOSIS — R2689 Other abnormalities of gait and mobility: Secondary | ICD-10-CM | POA: Diagnosis not present

## 2022-02-28 DIAGNOSIS — R269 Unspecified abnormalities of gait and mobility: Secondary | ICD-10-CM

## 2022-02-28 DIAGNOSIS — R262 Difficulty in walking, not elsewhere classified: Secondary | ICD-10-CM | POA: Insufficient documentation

## 2022-02-28 NOTE — Therapy (Unsigned)
OUTPATIENT OCCUPATIONAL THERAPY NEURO EVALUATION  Patient Name: [name] MRN: [id number] DOB:May 08, 1950, 71 y.o., male Today's Date: 02/28/2022  PCP: Dr. Elsie Stain REFERRING PROVIDER: Dr. Elsie Stain   OT End of Session - 02/28/22 1156     Visit Number 1    Number of Visits 24    Date for OT Re-Evaluation 05/23/22    OT Start Time 7829    OT Stop Time 1230    OT Time Calculation (min) 45 min    Equipment Utilized During Treatment rollator    Activity Tolerance Patient tolerated treatment well    Behavior During Therapy WFL for tasks assessed/performed             Past Medical History:  Diagnosis Date   Arthritis    Bradycardia    Cancer ([place]) 2013   skin cancer   Depression    ptsd   Dysrhythmia    chronic slow heart rate   GERD (gastroesophageal reflux disease)    Headache(784.0)    tension headaches non recent   History of chicken pox    History of kidney stones    passed   Hypertension    treated with HCTZ   Pacemaker    Parkinson's disease    dx'ed 15 years ago   PTSD (post-traumatic stress disorder)    Shortness of breath dyspnea    Sleep apnea    doesn't use C-pap   Varicose veins    Past Surgical History:  Procedure Laterality Date   CHOLECYSTECTOMY N/A 10/22/2014   Procedure: LAPAROSCOPIC CHOLECYSTECTOMY WITH INTRAOPERATIVE CHOLANGIOGRAM;  Surgeon: Dia Crawford III, MD;  Location: [facility]S;  Service: General;  Laterality: N/A;   cyst removed      from lip as a child   INTRAMEDULLARY (IM) NAIL INTERTROCHANTERIC N/A 02/18/2019   Procedure: INTRAMEDULLARY (IM) NAIL INTERTROCHANTRIC, RIGHT,;  Surgeon: Thornton Park, MD;  Location: [facility];  Service: Orthopedics;  Laterality: N/A;   LUMBAR LAMINECTOMY/DECOMPRESSION MICRODISCECTOMY Bilateral 12/14/2012   Procedure: Bilateral lumbar three-four, four-five decompressive laminotomy/foraminotomy;  Surgeon: Charlie Pitter, MD;  Location: [place] [facility];  Service: Neurosurgery;  Laterality: Bilateral;    PULSE GENERATOR IMPLANT Bilateral 12/13/2013   Procedure: Bilateral implantable pulse generator placement;  Surgeon: Erline Levine, MD;  Location: [place] [facility];  Service: Neurosurgery;  Laterality: Bilateral;  Bilateral implantable pulse generator placement   skin cancer removed     from ears,   12 lft arm  rt leg 15   SUBTHALAMIC STIMULATOR BATTERY REPLACEMENT Bilateral 07/14/2017   Procedure: BILATERAL IMPLANTED PULSE GENERATOR CHANGE FOR DEEP BRAIN STIMULATOR;  Surgeon: Erline Levine, MD;  Location: [place];  Service: Neurosurgery;  Laterality: Bilateral;   SUBTHALAMIC STIMULATOR INSERTION Bilateral 12/06/2013   Procedure: SUBTHALAMIC STIMULATOR INSERTION;  Surgeon: Erline Levine, MD;  Location: [place] [facility];  Service: Neurosurgery;  Laterality: Bilateral;  Bilateral deep brain stimulator placement   Patient Active Problem List   Diagnosis Date Noted   Hematuria 10/10/2021   Sinus drainage 12/09/2020   Medicare annual wellness visit, subsequent 11/18/2020   Pacemaker    Dementia associated with Parkinson's disease ([place]) 06/17/2020   Vertigo 02/19/2020   Urine abnormality 02/19/2020   Dysphagia 02/23/2019   Fall at home 10/18/2017   REM behavioral disorder 11/02/2016   Radicular pain in right arm 10/09/2016   GERD (gastroesophageal reflux disease) 07/31/2016   Colon cancer screening 07/23/2015   Gout 02/18/2015   Depression 01/06/2015   Skin lesion 10/02/2014   Dupuytren's contracture 08/20/2014  Cough 07/21/2014   Lumbar stenosis with neurogenic claudication 06/13/2014   S/P deep brain stimulator placement 05/08/2014   Advance care planning 01/19/2014   Parkinson's disease 12/13/2013   PTSD (post-traumatic stress disorder) 06/13/2013   Erectile dysfunction 06/13/2013   HLD (hyperlipidemia) 06/13/2013   Essential hypertension 06/03/2013   Bradycardia by electrocardiogram 06/03/2013   Obstructive sleep apnea 03/12/2013    ONSET DATE: 2015  REFERRING DIAG: Parkinson's  Disease  THERAPY DIAG:  Muscle weakness (generalized)  Other lack of coordination  Parkinson's disease with dyskinesia without fluctuating manifestations  Rationale for Evaluation and Treatment: Rehabilitation  SUBJECTIVE:   SUBJECTIVE STATEMENT: Spouse reports pt with multiple hospitalizations over the last 6 months, contributing to functional decline since last seen by OT in April of 2023. Pt accompanied by:  spouse [name]) and caregiver Ambulance person)  PERTINENT HISTORY: Parkinson's Disease, dementia, DBS, multiple falls  PRECAUTIONS: Fall  WEIGHT BEARING RESTRICTIONS: No  PAIN:  Are you having pain? Yes: NPRS scale: 4/10 Pain location: hips and low back Pain description: sharp, tight Aggravating factors: if pt crosses feet too long/awkward positioning Relieving factors: voltaren gel   FALLS: Has patient fallen in last 6 months? Yes. Number of falls : spouse estimates 20  LIVING ENVIRONMENT: Lives with: {OPRC lives with:25569::"lives with their family"} Lives in: {Lives in:25570} Stairs: {opstairs:27293} Has following equipment at home: {Assistive devices:23999}  PLOF: {PLOF:24004}  PATIENT GOALS: ***  OBJECTIVE:   HAND DOMINANCE: Right  ADLs: Overall ADLs: *** Transfers/ambulation related to ADLs: Eating: caregivers help to cut food Grooming: mod A to shave with an Copy UB Dressing: min A, difficulty with buttons LB Dressing: mod A, difficulty with zippers and buttons Toileting: occasional accidents with bowel and bladder, but less than weekly and only during the night; distant supv for toileting during the day Bathing: min A for bathing, mod A for drying off Tub Shower transfers: min guard for shower transfers with grab bar Equipment: {equipment:25573}  IADLs: Shopping: *** Light housekeeping: *** Meal Prep: *** Community mobility: *** Medication management: *** Financial management: *** Handwriting: {OTWRITTENEXPRESSION:25361}  MOBILITY  STATUS: {OTMOBILITY:25360}  POSTURE COMMENTS:  {posture:25561} Sitting balance: {sitting balance:25483}  ACTIVITY TOLERANCE: Activity tolerance: ***  FUNCTIONAL OUTCOME MEASURES: {OTFUNCTIONALMEASURES:27238}  UPPER EXTREMITY ROM:    {AROM/PROM:27142} ROM Right eval Left eval  Shoulder flexion    Shoulder abduction    Shoulder adduction    Shoulder extension    Shoulder internal rotation    Shoulder external rotation    Elbow flexion    Elbow extension    Wrist flexion    Wrist extension    Wrist ulnar deviation    Wrist radial deviation    Wrist pronation    Wrist supination    (Blank rows = not tested)  UPPER EXTREMITY MMT:     MMT Right eval Left eval  Shoulder flexion    Shoulder abduction    Shoulder adduction    Shoulder extension    Shoulder internal rotation    Shoulder external rotation    Middle trapezius    Lower trapezius    Elbow flexion    Elbow extension    Wrist flexion    Wrist extension    Wrist ulnar deviation    Wrist radial deviation    Wrist pronation    Wrist supination    (Blank rows = not tested)  HAND FUNCTION: Grip strength: Right: 61 lbs; Left: 61 lbs, Lateral pinch: Right: 20 lbs, Left: 19 lbs, and 3 point pinch: Right:  11 lbs, Left: 15 lbs  COORDINATION: Finger Nose Finger test: increased time, L side touched lower than tip, corrected with min vc 9 Hole Peg test: Right: 1 min 18 sec; Left: 41 sec  SENSATION: {sensation:27233}  EDEMA: ***  MUSCLE TONE: {UETONE:25567}  COGNITION: Overall cognitive status: {cognition:24006}  VISION: Subjective report: *** Baseline vision: {OTBASELINEVISION:25363} Visual history: {OTVISUALHISTORY:25364}  VISION ASSESSMENT: {visionassessment:27231}  Patient has difficulty with following activities due to following visual impairments: ***  PERCEPTION: {Perception:25564}  PRAXIS: {Praxis:25565}  OBSERVATIONS: ***   TODAY'S TREATMENT:                                                                                                                               DATE: ***   PATIENT EDUCATION: Education details: *** Person educated: {Person educated:25204} Education method: {Education Method:25205} Education comprehension: {Education Comprehension:25206}  HOME EXERCISE PROGRAM: ***   GOALS: Goals reviewed with patient? {yes/no:20286}  SHORT TERM GOALS: Target date: ***  *** Baseline: Goal status: {GOALSTATUS:25110}  2.  *** Baseline:  Goal status: {GOALSTATUS:25110}  3.  *** Baseline:  Goal status: {GOALSTATUS:25110}  4.  *** Baseline:  Goal status: {GOALSTATUS:25110}  5.  *** Baseline:  Goal status: {GOALSTATUS:25110}  6.  *** Baseline:  Goal status: {GOALSTATUS:25110}  LONG TERM GOALS: Target date: ***  *** Baseline:  Goal status: {GOALSTATUS:25110}  2.  *** Baseline:  Goal status: {GOALSTATUS:25110}  3.  *** Baseline:  Goal status: {GOALSTATUS:25110}  4.  *** Baseline:  Goal status: {GOALSTATUS:25110}  5.  *** Baseline:  Goal status: {GOALSTATUS:25110}  6.  *** Baseline:  Goal status: {GOALSTATUS:25110}  ASSESSMENT:  CLINICAL IMPRESSION: Patient is a *** y.o. *** who was seen today for occupational therapy evaluation for ***.   PERFORMANCE DEFICITS: in functional skills including {OT physical skills:25468}, cognitive skills including {OT cognitive skills:25469}, and psychosocial skills including {OT psychosocial skills:25470}.   IMPAIRMENTS: are limiting patient from {OT performance deficits:25471}.   CO-MORBIDITIES: {Comorbidities:25485} that affects occupational performance. Patient will benefit from skilled OT to address above impairments and improve overall function.  MODIFICATION OR ASSISTANCE TO COMPLETE EVALUATION: {OT modification:25474}  OT OCCUPATIONAL PROFILE AND HISTORY: {OT PROFILE AND HISTORY:25484}  CLINICAL DECISION MAKING: {OT CDM:25475}  REHAB POTENTIAL:  {rehabpotential:25112}  EVALUATION COMPLEXITY: {Evaluation complexity:25115}    PLAN:  OT FREQUENCY: {rehab frequency:25116}  OT DURATION: {rehab duration:25117}  PLANNED INTERVENTIONS: {OT Interventions:25467}  RECOMMENDED OTHER SERVICES: ***  CONSULTED AND AGREED WITH PLAN OF CARE: {ZYY:48250}  PLAN FOR NEXT SESSION: ***   Darleene Cleaver, OT 02/28/2022, 11:57 AM

## 2022-02-28 NOTE — Therapy (Signed)
OUTPATIENT PHYSICAL THERAPY NEURO RE-EVALUATION/RECERTIFICATION    Patient Name: [name] MRN: [id number] DOB:[birth year], 71 y.o., male Today's Date: 02/28/2022   PCP: Dr. Elsie Stain REFERRING PROVIDER: Dr. Sharolyn Douglas   PT End of Session - 02/28/22 1715     Visit Number 1    Number of Visits 24    Date for PT Re-Evaluation 05/23/22    Authorization Type [employer] Medicare Choice PPO    Authorization Time Period Recert [id number]    PT Start Time 1102    PT Stop Time 1145    PT Time Calculation (min) 43 min    Equipment Utilized During Treatment Gait belt    Activity Tolerance Patient tolerated treatment well    Behavior During Therapy WFL for tasks assessed/performed               Past Medical History:  Diagnosis Date   Arthritis    Bradycardia    Cancer ([place]) 2013   skin cancer   Depression    ptsd   Dysrhythmia    chronic slow heart rate   GERD (gastroesophageal reflux disease)    Headache(784.0)    tension headaches non recent   History of chicken pox    History of kidney stones    passed   Hypertension    treated with HCTZ   Pacemaker    Parkinson's disease    dx'ed 15 years ago   PTSD (post-traumatic stress disorder)    Shortness of breath dyspnea    Sleep apnea    doesn't use C-pap   Varicose veins    Past Surgical History:  Procedure Laterality Date   CHOLECYSTECTOMY N/A 10/22/2014   Procedure: LAPAROSCOPIC CHOLECYSTECTOMY WITH INTRAOPERATIVE CHOLANGIOGRAM;  Surgeon: Dia Crawford III, MD;  Location: [facility];  Service: General;  Laterality: N/A;   cyst removed      from lip as a child   INTRAMEDULLARY (IM) NAIL INTERTROCHANTERIC N/A 02/18/2019   Procedure: INTRAMEDULLARY (IM) NAIL INTERTROCHANTRIC, RIGHT,;  Surgeon: Thornton Park, MD;  Location: [facility];  Service: Orthopedics;  Laterality: N/A;   LUMBAR LAMINECTOMY/DECOMPRESSION MICRODISCECTOMY Bilateral 12/14/2012   Procedure: Bilateral lumbar three-four, four-five  decompressive laminotomy/foraminotomy;  Surgeon: Charlie Pitter, MD;  Location: [place] [facility];  Service: Neurosurgery;  Laterality: Bilateral;   PULSE GENERATOR IMPLANT Bilateral 12/13/2013   Procedure: Bilateral implantable pulse generator placement;  Surgeon: Erline Levine, MD;  Location: [place] [facility];  Service: Neurosurgery;  Laterality: Bilateral;  Bilateral implantable pulse generator placement   skin cancer removed     from ears,   12 lft arm  rt leg 15   SUBTHALAMIC STIMULATOR BATTERY REPLACEMENT Bilateral 07/14/2017   Procedure: BILATERAL IMPLANTED PULSE GENERATOR CHANGE FOR DEEP BRAIN STIMULATOR;  Surgeon: Erline Levine, MD;  Location: [place];  Service: Neurosurgery;  Laterality: Bilateral;   SUBTHALAMIC STIMULATOR INSERTION Bilateral 12/06/2013   Procedure: SUBTHALAMIC STIMULATOR INSERTION;  Surgeon: Erline Levine, MD;  Location: [place] [facility];  Service: Neurosurgery;  Laterality: Bilateral;  Bilateral deep brain stimulator placement   Patient Active Problem List   Diagnosis Date Noted   Hematuria 10/10/2021   Sinus drainage 12/09/2020   Medicare annual wellness visit, subsequent 11/18/2020   Pacemaker    Dementia associated with Parkinson's disease ([place]) 06/17/2020   Vertigo 02/19/2020   Urine abnormality 02/19/2020   Dysphagia 02/23/2019   Fall at home 10/18/2017   REM behavioral disorder 11/02/2016   Radicular pain in right arm 10/09/2016   GERD (gastroesophageal reflux disease) 07/31/2016  Colon cancer screening 07/23/2015   Gout 02/18/2015   Depression 01/06/2015   Skin lesion 10/02/2014   Dupuytren's contracture 08/20/2014   Cough 07/21/2014   Lumbar stenosis with neurogenic claudication 06/13/2014   S/P deep brain stimulator placement 05/08/2014   Advance care planning 01/19/2014   Parkinson's disease 12/13/2013   PTSD (post-traumatic stress disorder) 06/13/2013   Erectile dysfunction 06/13/2013   HLD (hyperlipidemia) 06/13/2013   Essential hypertension 06/03/2013    Bradycardia by electrocardiogram 06/03/2013   Obstructive sleep apnea 03/12/2013    ONSET DATE: 15 years ago per chart  REFERRING DIAG: Parkinson's disease  THERAPY DIAG:  Difficulty in walking, not elsewhere classified - Plan: PT plan of care cert/re-cert  Unsteadiness on feet - Plan: PT plan of care cert/re-cert  Repeated falls - Plan: PT plan of care cert/re-cert  Abnormality of gait and mobility - Plan: PT plan of care cert/re-cert  Other abnormalities of gait and mobility - Plan: PT plan of care cert/re-cert  Rationale for Evaluation and Treatment Rehabilitation  SUBJECTIVE:                                                                                                                                                                                              SUBJECTIVE STATEMENT: Pt caregiver provided majority of the following history. Pt still falling very frequently about once every 10 days. Pt caregiver has also been having health issues. Pt was unable to be taken to appointments by his aid per [facility].  rules and regulations. Pt halucinations have continued to get worse. Pt sleep has been improving some and is taking new medication prescribed by [place]. Pt and caregiver still report impulse driven falls when he does not think through his actions.    Resting: 123/90 with HR 61 Pt accompanied by: significant other  PERTINENT HISTORY:   Date of most recent Admission: 09/25/2021 Date of Discharge: 09/28/2021  Discharge Diagnoses:  Altered mental status Parkinson disease (CMS/HCC) Hallucinations  Per [facility] discharge summary notes PHI : Hospital Course:   71 y.o. male with a history of Parkinson's Disease w/ DBS in place, PD-related dementia, OSA, depression, and seizure disorder who presented for EEG given concern for seizures.  Altered mental status Concern seizure activity Periods of unresponsiveness on the setting of new medication--Nuplazid (pimavanserin) recently  started. No events of note on continuous EEG. This was held and he returned to his baseline mental status. Other workup including CT head, CTA h/n without obvious cause for periodic unresponsiveness. Nuplazid was discontinued.   Aspiration, weakness Of note, he had a coughing spell while eating though sounds like was precipitated  by sneezing. Did well on RA, seen by SLP without any further needs or concerns.  Parkinson's Disease Parkinson's-related dementia - He follows in the Movement Disorders center at [facility]. Clinic notes suggest he seems to have worsening of his overall disease trajectory. - Continued home Memantine and Sinemet      PMH: HTN, bradycardia s/p PPM, PD, DBS, COVID-19 infection, chronic low back pain s/p L4/5 surgery.  PAIN:  Are you having pain? Yes: NPRS scale: 7/10 Pain location: Low back Pain description: ache Aggravating factors: Prolonged standing/sitting, bending over Relieving factors: rest, changing positions  PRECAUTIONS: Fall  WEIGHT BEARING RESTRICTIONS No  FALLS: Has patient fallen in last 6 months? Yes. Number of falls >10  LIVING ENVIRONMENT: Lives with: lives with their spouse Lives in: House/apartment Stairs:  Ramp Has following equipment at home: Single point cane, Environmental consultant - 4 wheeled, Wheelchair (manual), shower chair, bed side commode, Grab bars, and Ramped entry  PLOF: Needs assistance with ADLs, Needs assistance with homemaking, Needs assistance with gait, and Needs assistance with transfers  PATIENT GOALS To walk better and not fall. Improve fall frequency to prevent significant injury   OBJECTIVE:   DIAGNOSTIC FINDINGS:   COGNITION: Overall cognitive status: History of cognitive impairments - at baseline   SENSATION: Light touch: WFL  COORDINATION: Decreased and slow to initiate  EDEMA:  None observed     POSTURE: rounded shoulders, forward head, increased thoracic kyphosis, and flexed trunk       TRANSFERS: Assistive device utilized:  Upright 4WW   Sit to stand:  Min/mod assist- with increased VC for safety Stand to sit:  Min assist with Verbal cues to lock brakes and reach back Chair to chair: Min A     GAIT: Gait pattern: decreased arm swing- Right, decreased arm swing- Left, decreased step length- Right, decreased step length- Left, decreased stance time- Right, decreased stance time- Left, decreased stride length, and shuffling Distance walked: 150 ft. Assistive device utilized:  upright 4WW Level of assistance: Min A Comments: Shuffling gait with slow gait speed  FUNCTIONAL TESTs:  5 times sit to stand: 19.5 sec with UE support with BUE support Timed up and go (TUG): 37.26 sec with upright 4WW 6 minute walk test: 720 feet using upright 4WW 10 meter walk test: 0.67 m/s  PATIENT SURVEYS:  FOTO 50  TODAY'S TREATMENT:  Eval Only   PATIENT EDUCATION: Education details: PT plan of care; purpose of functional outcomes Person educated: Patient and Spouse Education method: Explanation and Verbal cues Education comprehension: verbalized understanding   HOME EXERCISE PROGRAM: To be reviewed and progressed next 1-2 visits    GOALS: Goals reviewed with patient? Yes  SHORT TERM GOALS: Target date: 01/11/2022  Pt will be independent with HEP in order to improve strength and balance in order to decrease fall risk and improve function at home and work.  Baseline: Patient reports not performing much in the way of any HEP except walking. Goal status: INITIAL  LONG TERM GOALS: Target date: 02/22/2022  Pt will decrease 5TSTS by at least 5 seconds in order to demonstrate clinically significant improvement in LE strength. Baseline: 02/28/2022: 19.5 sec with UE support   Goal status: INITIAL  2.  Pt will improve FOTO to target score of 48 to display perceived improvements in ability to complete ADL's.  Baseline: 02/28/22=40 Goal status: INITIAL  3.  Pt will  decrease TUG to < or = to 25 seconds/decrease in order to demonstrate decreased fall risk. Baseline: 02/28/22= 37.26  sec using upright 4WW Goal status: INITIAL  4.  Pt will increase 10MWT by at least 0.13 m/s in order to demonstrate clinically significant improvement in community ambulation.   Baseline: 02/28/22= 0.67 m/s using upright 4WW Goal status: INITIAL  5.  Patient will increase six minute walk test distance to >1000 for progression to community ambulator and improve gait ability  Baseline: 02/28/22: 720 feet with up walker  Goal status: INITIAL  6.  Patient will experience 1 fall or less per 2 week period for at least 3 weeks in order to improve his frequency of falls and prevent injury from falls  Baseline: 02/28/22: pt falls approximately once every 10 days  Goal status: INITIAL  ASSESSMENT:  CLINICAL IMPRESSION: Patient is a 72  y.o. male who was seen today for physical therapy re-evaluation and treatment for Parkinson's disease. Pt previously seen at this clinic but has been hospitalized several times and has also undergone surgical procedure for replacing batter in his DBS which has also affected his overall function.  Pt  retested on several outcome measures and pt demonstrates significant risk factor for falls based on these outcome measures. In addition pt reported frequency of falls is posing significant risk factor for fall that could result in serious injury.  Patient's condition has the potential to improve in response to therapy. Maximum improvement is yet to be obtained. The anticipated improvement is attainable and reasonable in a generally predictable time.  Pt will continue to benefit from skilled PT intervnention to iomprove his strength, balance, prevent falls, and improve QOL and caregiver burden.    OBJECTIVE IMPAIRMENTS Abnormal gait, decreased activity tolerance, decreased balance, decreased cognition, decreased coordination, decreased endurance, decreased  mobility, difficulty walking, decreased ROM, decreased strength, decreased safety awareness, hypomobility, impaired perceived functional ability, and pain.   ACTIVITY LIMITATIONS carrying, lifting, bending, standing, squatting, stairs, transfers, bed mobility, bathing, toileting, dressing, self feeding, hygiene/grooming, and caring for others  PARTICIPATION LIMITATIONS: meal prep, cleaning, laundry, medication management, personal finances, driving, shopping, community activity, and yard work  PERSONAL FACTORS Time since onset of injury/illness/exacerbation are also affecting patient's functional outcome.   REHAB POTENTIAL: Fair    CLINICAL DECISION MAKING: Evolving/moderate complexity  EVALUATION COMPLEXITY: Moderate  PLAN: PT FREQUENCY: 2x/week  PT DURATION: 12 weeks  PLANNED INTERVENTIONS: Therapeutic exercises, Therapeutic activity, Neuromuscular re-education, Balance training, Gait training, Patient/Family education, Self Care, Joint mobilization, Joint manipulation, Stair training, Vestibular training, Canalith repositioning, DME instructions, Dry Needling, Electrical stimulation, Spinal manipulation, Spinal mobilization, Cryotherapy, Moist heat, Manual therapy, and Re-evaluation  PLAN FOR NEXT SESSION: begin New HEP, work with pt regarding safety at home.    Particia Lather, PT 02/28/2022, 5:15 PM

## 2022-03-01 ENCOUNTER — Ambulatory Visit (INDEPENDENT_AMBULATORY_CARE_PROVIDER_SITE_OTHER): Payer: Medicare PPO | Admitting: Psychology

## 2022-03-01 DIAGNOSIS — F4323 Adjustment disorder with mixed anxiety and depressed mood: Secondary | ICD-10-CM

## 2022-03-01 DIAGNOSIS — F331 Major depressive disorder, recurrent, moderate: Secondary | ICD-10-CM | POA: Diagnosis not present

## 2022-03-01 NOTE — Therapy (Signed)
OUTPATIENT OCCUPATIONAL THERAPY NEURO EVALUATION  Patient Name: [name] MRN: [id number] DOB:1950/05/28, 71 y.o., male Today's Date: 02/28/2022  PCP: Dr. Elsie Stain REFERRING PROVIDER: Dr. Elsie Stain   OT End of Session - 02/28/22 1156     Visit Number 1    Number of Visits 24    Date for OT Re-Evaluation 05/23/22    OT Start Time 6160    OT Stop Time 1230    OT Time Calculation (min) 45 min    Equipment Utilized During Treatment rollator    Activity Tolerance Patient tolerated treatment well    Behavior During Therapy WFL for tasks assessed/performed             Past Medical History:  Diagnosis Date   Arthritis    Bradycardia    Cancer ([place]) 2013   skin cancer   Depression    ptsd   Dysrhythmia    chronic slow heart rate   GERD (gastroesophageal reflux disease)    Headache(784.0)    tension headaches non recent   History of chicken pox    History of kidney stones    passed   Hypertension    treated with HCTZ   Pacemaker    Parkinson's disease    dx'ed 15 years ago   PTSD (post-traumatic stress disorder)    Shortness of breath dyspnea    Sleep apnea    doesn't use C-pap   Varicose veins    Past Surgical History:  Procedure Laterality Date   CHOLECYSTECTOMY N/A 10/22/2014   Procedure: LAPAROSCOPIC CHOLECYSTECTOMY WITH INTRAOPERATIVE CHOLANGIOGRAM;  Surgeon: Dia Crawford III, MD;  Location: [facility] ORS;  Service: General;  Laterality: N/A;   cyst removed      from lip as a child   INTRAMEDULLARY (IM) NAIL INTERTROCHANTERIC N/A 02/18/2019   Procedure: INTRAMEDULLARY (IM) NAIL INTERTROCHANTRIC, RIGHT,;  Surgeon: Thornton Park, MD;  Location: [facility] ORS;  Service: Orthopedics;  Laterality: N/A;   LUMBAR LAMINECTOMY/DECOMPRESSION MICRODISCECTOMY Bilateral 12/14/2012   Procedure: Bilateral lumbar three-four, four-five decompressive laminotomy/foraminotomy;  Surgeon: Charlie Pitter, MD;  Location: [place] [facility]UR[facility];  Service: Neurosurgery;  Laterality: Bilateral;    PULSE GENERATOR IMPLANT Bilateral 12/13/2013   Procedure: Bilateral implantable pulse generator placement;  Surgeon: Erline Levine, MD;  Location: [place] [facility];  Service: Neurosurgery;  Laterality: Bilateral;  Bilateral implantable pulse generator placement   skin cancer removed     from ears,   12 lft arm  rt leg 15   SUBTHALAMIC STIMULATOR BATTERY REPLACEMENT Bilateral 07/14/2017   Procedure: BILATERAL IMPLANTED PULSE GENERATOR CHANGE FOR DEEP BRAIN STIMULATOR;  Surgeon: Erline Levine, MD;  Location: [place];  Service: Neurosurgery;  Laterality: Bilateral;   SUBTHALAMIC STIMULATOR INSERTION Bilateral 12/06/2013   Procedure: SUBTHALAMIC STIMULATOR INSERTION;  Surgeon: Erline Levine, MD;  Location: [facility];  Service: Neurosurgery;  Laterality: Bilateral;  Bilateral deep brain stimulator placement   Patient Active Problem List   Diagnosis Date Noted   Hematuria 10/10/2021   Sinus drainage 12/09/2020   Medicare annual wellness visit, subsequent 11/18/2020   Pacemaker    Dementia associated with Parkinson's disease ([place]) 06/17/2020   Vertigo 02/19/2020   Urine abnormality 02/19/2020   Dysphagia 02/23/2019   Fall at home 10/18/2017   REM behavioral disorder 11/02/2016   Radicular pain in right arm 10/09/2016   GERD (gastroesophageal reflux disease) 07/31/2016   Colon cancer screening 07/23/2015   Gout 02/18/2015   Depression 01/06/2015   Skin lesion 10/02/2014   Dupuytren's contracture 08/20/2014  Cough 07/21/2014   Lumbar stenosis with neurogenic claudication 06/13/2014   S/P deep brain stimulator placement 05/08/2014   Advance care planning 01/19/2014   Parkinson's disease 12/13/2013   PTSD (post-traumatic stress disorder) 06/13/2013   Erectile dysfunction 06/13/2013   HLD (hyperlipidemia) 06/13/2013   Essential hypertension 06/03/2013   Bradycardia by electrocardiogram 06/03/2013   Obstructive sleep apnea 03/12/2013    ONSET DATE: 2015  REFERRING DIAG: Parkinson's  Disease  THERAPY DIAG:  Muscle weakness (generalized)  Other lack of coordination  Parkinson's disease with dyskinesia without fluctuating manifestations  Rationale for Evaluation and Treatment: Rehabilitation  SUBJECTIVE:   SUBJECTIVE STATEMENT: Spouse reports pt with multiple hospitalizations over the last 6 months, contributing to functional decline since last seen by OT in April of 2023. Pt accompanied by:  spouse [name]) and caregiver Ambulance person)  PERTINENT HISTORY: Parkinson's Disease, dementia, DBS, multiple falls  PRECAUTIONS: Fall  WEIGHT BEARING RESTRICTIONS: No  PAIN:  Are you having pain? Yes: NPRS scale: 4/10 Pain location: hips and low back Pain description: sharp, tight Aggravating factors: if pt crosses feet too long/awkward positioning Relieving factors: voltaren gel   FALLS: Has patient fallen in last 6 months? Yes. Number of falls : spouse estimates 20  LIVING ENVIRONMENT: Lives with: lives with their spouse Lives in: house, 1 level Stairs: No Has following equipment at home: [place] entry, handicapped accessible bathroom  PLOF: Needs assistance with ADLs  PATIENT GOALS: increase bilat hand coordination for ADLs  OBJECTIVE:   HAND DOMINANCE: Right  ADLs: Overall ADLs: caregiver assist for all ADLs; pt currently as 16 hrs of caregiver assist per week through the [place].  Pt recently was assigned 100% disability by the [place], and spouse reports caregiver hours will increase to 46 per week, but uncertain when Transfers/ambulation related to ADLs: supv; pt uses upright 4 wheeled walker Eating: caregivers help to cut food Grooming: mod A to shave with an electric razor UB Dressing: min A, difficulty with buttons LB Dressing: mod A, difficulty with zippers and buttons Toileting: occasional accidents with bowel and bladder, but less than weekly and only during the night; distant supv for toileting during the day Bathing: min A for bathing, mod A for drying  off Tub Shower transfers: min guard for shower transfers with grab bar and shower chair Equipment:  fully handicapped shower and toilet  IADLs: Shopping: dep on caregiver Light housekeeping: dep Meal Prep: dep Community mobility: short distance community amb with supv and 4 wheeled walker Medication management: dep Financial management: dep Handwriting:  printed first/last name 50% legible, cursive first/last name illegible  MOBILITY STATUS: Hx of falls  POSTURE COMMENTS:  rounded shoulders, forward head, increased thoracic kyphosis, and flexed trunk   FUNCTIONAL OUTCOME MEASURES: FOTO: 40.2 (completed with PT); predicted 48  UPPER EXTREMITY ROM:   Pt demos BUE shoulder ROM WFL for self care  UPPER EXTREMITY MMT:     MMT Right eval Left eval  Shoulder flexion 5 5  Shoulder abduction 5 5  Shoulder adduction    Shoulder extension    Shoulder internal rotation    Shoulder external rotation    Middle trapezius    Lower trapezius    Elbow flexion 5 5  Elbow extension 5 5  Wrist flexion 5 5  Wrist extension 5 5  Wrist ulnar deviation    Wrist radial deviation    Wrist pronation    Wrist supination    (Blank rows = not tested)  HAND FUNCTION: Grip strength: Right: 61 lbs;  Left: 61 lbs, Lateral pinch: Right: 20 lbs, Left: 19 lbs, and 3 point pinch: Right: 11 lbs, Left: 15 lbs  COORDINATION: Finger Nose Finger test: increased time, L side touched lower than tip, corrected with min vc 9 Hole Peg test: Right: 1 min 18 sec; Left: 41 sec  SENSATION: Proprioception: Impaired   EDEMA: none  COGNITION: Overall cognitive status: History of cognitive impairments - at baseline; Parkinson's with dementia  VISION: Wears prism glasses; hx of visual hallucinations  PRAXIS: Impaired: Motor planning; bradykinesia, R>L  TODAY'S TREATMENT:                                                                                                                              DATE:  02/28/22 Evaluation completed.  Objective measures taken and goals established.   PATIENT EDUCATION: Education details: OT poc Person educated: Patient, Spouse, and Caregiver , [name] Education method: Explanation Education comprehension: verbalized understanding  HOME EXERCISE PROGRAM: To be established in the next 1-2 sessions   GOALS: Goals reviewed with patient? Yes  SHORT TERM GOALS: Target date: 04/11/22  1. Pt will complete HEP for increasing [place] and strength in bilat hands with direct supv at least 4 of 7 days per week. Baseline: not yet initiated Goal status: INITIAL  2. Pt will clean his glasses with min A and mod vc, including opening cleaning packet.   Baseline: max A  Goal status: initial     LONG TERM GOALS: Target date: 05/23/22  Pt will increase FOTO score by 5 or more points to indicate increase in self/caregiver perceived functional performance with daily tasks.  Baseline: 40.2 (predicted 48) Goal status: INITIAL  2.  Pt will increase R/L lateral pinch strength by 4 or more lbs to enable pt to independently turn on electric toothbrush and twist open toothpaste. Baseline: R 20, L 19; caregiver assist to turn on toothbrush and open toothpaste Goal status: INITIAL  3.  Pt will increase [place] in bilat hands to manage buttons and zippers on clothing 75% of the time.  Baseline: caregivers assist with clothing fasteners at least 50% of the time Goal status: INITIAL  4.  Pt will scoop food from plate/bowl with min vc using adapted utensils and adapted plates/bowls as needed.  Baseline: caregiver constantly turns plate to compensate for pt pushing food to 1 side. Goal status: INITIAL  5.  Pt will shave with electric razor with min A for thoroughness. Baseline: pt struggles to grade sufficient pressure to thoroughly shave face, requiring mod A. Goal status: INITIAL  ASSESSMENT:  CLINICAL IMPRESSION: Patient is a 71 y.o. male who was seen today for occupational  therapy evaluation for functional decline related to Parkinson's Disease.  Pt familiar to this therapist, last seen in OT clinic on 08/02/21.  Pt has had multiple hospitalizations since last OT discharge in April of this year, and spouse endorses decline in bilat hand coordination, contributing to increased caregiver burden with ADLs.  No significant decline in bilat grip strength since last seen, but noted decline in pinch strength and [facility] skills bilaterally.  Pt/spouse eager to improve these areas to maximize indep with ADLs.    PERFORMANCE DEFICITS: in functional skills including ADLs, IADLs, coordination, dexterity, proprioception, strength, pain, Fine motor control, Gross motor control, mobility, balance, body mechanics, endurance, decreased knowledge of use of DME, and UE functional use, cognitive skills including attention, learn, memory, orientation, problem solving, safety awareness, thought, and understand. IMPAIRMENTS: are limiting patient from ADLs, IADLs, rest and sleep, leisure, and social participation.   CO-MORBIDITIES: has co-morbidities such as dementia  that affects occupational performance. Patient will benefit from skilled OT to address above impairments and improve overall function.  MODIFICATION OR ASSISTANCE TO COMPLETE EVALUATION: Min-Moderate modification of tasks or assist with assess necessary to complete an evaluation.  OT OCCUPATIONAL PROFILE AND HISTORY: Problem focused assessment: Including review of records relating to presenting problem.  CLINICAL DECISION MAKING: Moderate - several treatment options, min-mod task modification necessary  REHAB POTENTIAL: Good  EVALUATION COMPLEXITY: Moderate    PLAN:  OT FREQUENCY: 2x/week  OT DURATION: 12 weeks  PLANNED INTERVENTIONS: self care/ADL training, therapeutic exercise, therapeutic activity, neuromuscular re-education, manual therapy, balance training, functional mobility training, moist heat, cryotherapy,  patient/family education, cognitive remediation/compensation, energy conservation, coping strategies training, and DME and/or AE instructions  RECOMMENDED OTHER SERVICES: N/A  CONSULTED AND AGREED WITH PLAN OF CARE: Patient and family member/caregiver  PLAN FOR NEXT SESSION: see eval  Leta Speller, MS, OTR/L  Darleene Cleaver, OT 02/28/2022, 11:57 AM

## 2022-03-02 ENCOUNTER — Encounter: Payer: Self-pay | Admitting: Physical Therapy

## 2022-03-02 ENCOUNTER — Ambulatory Visit: Payer: Medicare PPO

## 2022-03-02 ENCOUNTER — Ambulatory Visit: Payer: Medicare PPO | Admitting: Physical Therapy

## 2022-03-02 DIAGNOSIS — M6281 Muscle weakness (generalized): Secondary | ICD-10-CM | POA: Diagnosis not present

## 2022-03-02 DIAGNOSIS — R2689 Other abnormalities of gait and mobility: Secondary | ICD-10-CM | POA: Diagnosis not present

## 2022-03-02 DIAGNOSIS — G20A1 Parkinson's disease without dyskinesia, without mention of fluctuations: Secondary | ICD-10-CM

## 2022-03-02 DIAGNOSIS — R2681 Unsteadiness on feet: Secondary | ICD-10-CM | POA: Diagnosis not present

## 2022-03-02 DIAGNOSIS — R296 Repeated falls: Secondary | ICD-10-CM | POA: Diagnosis not present

## 2022-03-02 DIAGNOSIS — R262 Difficulty in walking, not elsewhere classified: Secondary | ICD-10-CM

## 2022-03-02 DIAGNOSIS — R278 Other lack of coordination: Secondary | ICD-10-CM

## 2022-03-02 DIAGNOSIS — G20B1 Parkinson's disease with dyskinesia, without mention of fluctuations: Secondary | ICD-10-CM | POA: Diagnosis not present

## 2022-03-02 DIAGNOSIS — R269 Unspecified abnormalities of gait and mobility: Secondary | ICD-10-CM | POA: Diagnosis not present

## 2022-03-02 NOTE — Progress Notes (Signed)
[place] Counselor/Therapist Progress Note  Patient ID: [name], MRN: [id number],    Date: 03/01/2022  Time Spent: 50 minutes  Treatment Type: Indivdual  Reported Symptoms: sadness, and boredom  Mental Status Exam: Appearance:  Casual     Behavior: normal  Motor: Psychomotor Retardation  Speech/Language:  Slow  Affect: flat  Mood: normal  Thought process: concrete  Thought content:   WNL  Sensory/Perceptual disturbances:   WNL  Orientation: oriented to person, place, time/date, and situation  Attention: Fair  Concentration: Fair  Memory: Immediate;   [place] of knowledge:  Fair  Insight:   Fair  Judgment:  Fair  Impulse Control: Fair   Risk Assessment: Danger to Self:  No Self-injurious Behavior: No Danger to Others: No Duty to Warn:no Physical Aggression / Violence:No  Access to Firearms a concern: No  Gang Involvement:No   Subjective: The patient attended a face-to-face individual therapy session  via video visit today.  The patient gave verbal consent for the video to be on WebEx.  The patient was in his home  and therapist was in the office.  The patient presents with a flat affect and mood is pleasant.  The patient and his wife were placed  because he apparently got 100% disability from the [place] and this will be really helpful for them financially.  We talked about what he is doing for pleasurable activities and it seems that he is not doing a lot outside the home.  He does seem to be in a bit better mood than he has been in the past and does not seem to be quite so depressed.  He did get another battery for his deep brain stimulator and that may have made a difference.  We talked about what the plan was for the holidays and I encouraged him to continue to do things that make him happy and give him some joy.   Interventions: Ego-Supportive, CBT  Diagnosis:Major depressive disorder, recurrent episode, moderate (HCC)  Adjustment disorder with  mixed anxiety and depressed mood  Plan: Client Abilities/Strengths  Pt is bright and has a good sense of humor.  Client Treatment Preferences  Individual therapy.  Client Statement of Needs  improve coping skills regarding chronic illness.  Treatment Level  Outpatient Individual therapy  Symptoms  Depressed or irritable mood.: No Description Entered (Status: improved). Diminished interest in or  enjoyment of activities.: No Description Entered (Status: maintained). Feelings of hopelessness,  worthlessness, or inappropriate guilt.: No Description Entered (Status: improved). Lack of energy.: No  Description Entered (Status: maintained). Psychomotor agitation or retardation.: No Description  Entered (Status: maintained). Social withdrawal.: No Description Entered (Status: improved).  Problems Addressed  Unipolar Depression, Unipolar Depression, Unipolar Depression, Unipolar Depression  Goals 1. Alleviate depressive symptoms and return to previous level of effective  functioning. 2. Develop healthy interpersonal relationships that lead to the alleviation  and help prevent the relapse of depression. Objective Describe current and past experiences with depression including their impact on functioning and  attempts to resolve it. Target Date: 2022-04-30 Frequency: Monthly Progress: 60 Modality: individual Related Interventions 1. Encourage the client to share his/her thoughts and feelings of depression; express empathy and  build rapport while identifying primary cognitive, behavioral, interpersonal, or other  contributors to depression. Objective Learn and implement behavioral strategies to overcome depression. Target Date: 2022-04-30 Frequency: Monthly Progress: 80 Modality: individual Objective Identify and replace thoughts and beliefs that support depression. Target Date: 2022-04-30 Frequency: Monthly Progress: 70 Modality: individual  Related Interventions 1. Conduct  Cognitive-Behavioral Therapy (see Cognitive Behavior Therapy by Olevia Bowens; Overcoming Depression by Lynita Lombard al.), beginning with helping the client learn the connection among  cognition, depressive feelings, and actions. 2. Explore and restructure underlying assumptions and beliefs reflected in biased self-talk that  may put the client at risk for relapse or recurrence. 3. Facilitate and reinforce the client's shift from biased depressive self-talk and beliefs to realitybased cognitive messages that enhance self-confidence and increase adaptive actions (see  "Positive Self-Talk" in the Adult Psychotherapy Homework Planner by Bryn Gulling). Objective Learn and implement problem-solving and decision-making skills. Target Date: 2022-04-30 Frequency: Monthly Progress: 70 Modality: individual Related Interventions 1. Encourage in the client the development of a positive problem orientation in which problems  and solving them are viewed as a natural part of life and not something to be feared, despaired,  or avoided. 2. Conduct Problem-Solving Therapy (see Problem-Solving Therapy by Shawnee Knapp and Delane Ginger) using techniques such as psychoeducation, modeling, and role-playing to teach client problem-solving skills (i.e., defining a problem specifically, generating possible solutions, evaluating the pros  and cons of each solution, selecting and implementing a plan of action, evaluating the efficacy  of the plan, accepting or revising the plan); role-play application of the problem-solving skill to  a real life issue (or assign "Applying Problem-Solving to Interpersonal Conflict" in the Adult  Psychotherapy Homework Planner by Bryn Gulling). 3. Develop healthy thinking patterns and beliefs about self, others, and the world that lead to the alleviation and help prevent the relapse of  depression. 4. Recognize, accept, and cope with feelings of depression. Diagnosis Axis  none F43.21 (Adjustment Disorder, With  depressed mood) - Open -  [Signifier: n/a]  Adjustment Disorder,  With Depressed Mood  Axis  none 296.32 (Major depressive affective disorder, recurrent episode,  moderate) - Open - [Signifier: n/a]  Conditions For Discharge Achievement of treatment goals and objectives   Plan is to continue to offer supportive therapy to patient to help him deal with his depression related to his Parkinsons diagnosis.  Patient approved Treatment Plan   Blondine Hottel Rejeana Brock, LCSW

## 2022-03-02 NOTE — Progress Notes (Signed)
  Physician Documentation Your signature is required to indicate approval of the treatment plan as stated above. By signing this report, you are approving the plan of care. Please sign and either send electronically or print and fax the signed copy to the number below. If you approve with modifications, please indicate those in the space provided. Physician Signature: Elsie Stain Date:__11/15/23  Time:__10:14 AM

## 2022-03-02 NOTE — Therapy (Signed)
OUTPATIENT PHYSICAL THERAPY NEURO Treatment   Patient Name: [name] MRN: [id number] DOB:1950/04/25, 71 y.o., male Today's Date: 03/02/2022   PCP: Dr. Elsie Stain REFERRING PROVIDER: Dr. Sharolyn Douglas   PT End of Session - 03/02/22 1436     Visit Number 2    Number of Visits 24    Date for PT Re-Evaluation 05/23/22    Authorization Type [employer] PPO    Authorization Time Period Recert [id number]    Progress Note Due on Visit 10    PT Start Time 1145    PT Stop Time 1228    PT Time Calculation (min) 43 min    Equipment Utilized During Treatment Gait belt    Activity Tolerance Patient tolerated treatment well    Behavior During Therapy WFL for tasks assessed/performed                Past Medical History:  Diagnosis Date   Arthritis    Bradycardia    Cancer ([place]) 2013   skin cancer   Depression    ptsd   Dysrhythmia    chronic slow heart rate   GERD (gastroesophageal reflux disease)    Headache(784.0)    tension headaches non recent   History of chicken pox    History of kidney stones    passed   Hypertension    treated with HCTZ   Pacemaker    Parkinson's disease    dx'ed 15 years ago   PTSD (post-traumatic stress disorder)    Shortness of breath dyspnea    Sleep apnea    doesn't use C-pap   Varicose veins    Past Surgical History:  Procedure Laterality Date   CHOLECYSTECTOMY N/A 10/22/2014   Procedure: LAPAROSCOPIC CHOLECYSTECTOMY WITH INTRAOPERATIVE CHOLANGIOGRAM;  Surgeon: Dia Crawford III, MD;  Location: [facility] OR[facility];  Service: General;  Laterality: N/A;   cyst removed      from lip as a child   INTRAMEDULLARY (IM) NAIL INTERTROCHANTERIC N/A 02/18/2019   Procedure: INTRAMEDULLARY (IM) NAIL INTERTROCHANTRIC, RIGHT,;  Surgeon: Thornton Park, MD;  Location: [facility];  Service: Orthopedics;  Laterality: N/A;   LUMBAR LAMINECTOMY/DECOMPRESSION MICRODISCECTOMY Bilateral 12/14/2012   Procedure: Bilateral lumbar three-four,  four-five decompressive laminotomy/foraminotomy;  Surgeon: Charlie Pitter, MD;  Location: [place] [facility];  Service: Neurosurgery;  Laterality: Bilateral;   PULSE GENERATOR IMPLANT Bilateral 12/13/2013   Procedure: Bilateral implantable pulse generator placement;  Surgeon: Erline Levine, MD;  Location: [place] [facility];  Service: Neurosurgery;  Laterality: Bilateral;  Bilateral implantable pulse generator placement   skin cancer removed     from ears,   12 lft arm  rt leg 15   SUBTHALAMIC STIMULATOR BATTERY REPLACEMENT Bilateral 07/14/2017   Procedure: BILATERAL IMPLANTED PULSE GENERATOR CHANGE FOR DEEP BRAIN STIMULATOR;  Surgeon: Erline Levine, MD;  Location: [place];  Service: Neurosurgery;  Laterality: Bilateral;   SUBTHALAMIC STIMULATOR INSERTION Bilateral 12/06/2013   Procedure: SUBTHALAMIC STIMULATOR INSERTION;  Surgeon: Erline Levine, MD;  Location: [place] [facility];  Service: Neurosurgery;  Laterality: Bilateral;  Bilateral deep brain stimulator placement   Patient Active Problem List   Diagnosis Date Noted   Hematuria 10/10/2021   Sinus drainage 12/09/2020   Medicare annual wellness visit, subsequent 11/18/2020   Pacemaker    Dementia associated with Parkinson's disease ([place]) 06/17/2020   Vertigo 02/19/2020   Urine abnormality 02/19/2020   Dysphagia 02/23/2019   Fall at home 10/18/2017   REM behavioral disorder 11/02/2016   Radicular pain in right arm 10/09/2016  GERD (gastroesophageal reflux disease) 07/31/2016   Colon cancer screening 07/23/2015   Gout 02/18/2015   Depression 01/06/2015   Skin lesion 10/02/2014   Dupuytren's contracture 08/20/2014   Cough 07/21/2014   Lumbar stenosis with neurogenic claudication 06/13/2014   S/P deep brain stimulator placement 05/08/2014   Advance care planning 01/19/2014   Parkinson's disease 12/13/2013   PTSD (post-traumatic stress disorder) 06/13/2013   Erectile dysfunction 06/13/2013   HLD (hyperlipidemia) 06/13/2013   Essential hypertension  06/03/2013   Bradycardia by electrocardiogram 06/03/2013   Obstructive sleep apnea 03/12/2013    ONSET DATE: 15 years ago per chart  REFERRING DIAG: Parkinson's disease  THERAPY DIAG:  Difficulty in walking, not elsewhere classified  Unsteadiness on feet  Repeated falls  Abnormality of gait and mobility  Rationale for Evaluation and Treatment Rehabilitation  SUBJECTIVE:                                                                                                                                                                                              SUBJECTIVE STATEMENT: Pt caregiver reports no significant changes since last session. No falls noted.    Resting: 123/90 with HR 61 Pt accompanied by: significant other  PERTINENT HISTORY:   Date of most recent Admission: 09/25/2021 Date of Discharge: 09/28/2021  Discharge Diagnoses:  Altered mental status Parkinson disease (CMS/HCC) Hallucinations  Per [facility] discharge summary notes PHI : Hospital Course:   71 y.o. male with a history of Parkinson's Disease w/ DBS in place, PD-related dementia, OSA, depression, and seizure disorder who presented for EEG given concern for seizures.  Altered mental status Concern seizure activity Periods of unresponsiveness on the setting of new medication--Nuplazid (pimavanserin) recently started. No events of note on continuous EEG. This was held and he returned to his baseline mental status. Other workup including CT head, CTA h/n without obvious cause for periodic unresponsiveness. Nuplazid was discontinued.   Aspiration, weakness Of note, he had a coughing spell while eating though sounds like was precipitated by sneezing. Did well on RA, seen by SLP without any further needs or concerns.  Parkinson's Disease Parkinson's-related dementia - He follows in the [facility] Disorders center at [facility]. Clinic notes suggest he seems to have worsening of his overall disease trajectory. -  Continued home Memantine and Sinemet      PMH: HTN, bradycardia s/p PPM, PD, DBS, COVID-19 infection, chronic low back pain s/p L4/5 surgery.  PAIN:  Are you having pain? Yes: NPRS scale: 7/10 Pain location: Low back Pain description: ache Aggravating factors: Prolonged standing/sitting, bending over Relieving factors: rest, changing positions  PRECAUTIONS: Fall  WEIGHT BEARING RESTRICTIONS No  FALLS: Has patient fallen in last 6 months? Yes. Number of falls >10  LIVING ENVIRONMENT: Lives with: lives with their spouse Lives in: House/apartment Stairs:  Ramp Has following equipment at home: Single point cane, Environmental consultant - 4 wheeled, Wheelchair (manual), shower chair, bed side commode, Grab bars, and Ramped entry  PLOF: Needs assistance with ADLs, Needs assistance with homemaking, Needs assistance with gait, and Needs assistance with transfers  PATIENT GOALS To walk better and not fall. Improve fall frequency to prevent significant injury   OBJECTIVE:   DIAGNOSTIC FINDINGS:   COGNITION: Overall cognitive status: History of cognitive impairments - at baseline   SENSATION: Light touch: WFL  COORDINATION: Decreased and slow to initiate  EDEMA:  None observed     POSTURE: rounded shoulders, forward head, increased thoracic kyphosis, and flexed trunk      TRANSFERS: Assistive device utilized:  Upright 4WW   Sit to stand:  Min/mod assist- with increased VC for safety Stand to sit:  Min assist with Verbal cues to lock brakes and reach back Chair to chair: Min A     GAIT: Gait pattern: decreased arm swing- Right, decreased arm swing- Left, decreased step length- Right, decreased step length- Left, decreased stance time- Right, decreased stance time- Left, decreased stride length, and shuffling Distance walked: 150 ft. Assistive device utilized:  upright 4WW Level of assistance: Min A Comments: Shuffling gait with slow gait speed  FUNCTIONAL TESTs:  5 times sit  to stand: 19.5 sec with UE support with BUE support Timed up and go (TUG): 37.26 sec with upright 4WW 6 minute walk test: 720 feet using upright 4WW 10 meter walk test: 0.67 m/s  PATIENT SURVEYS:  FOTO 50  TODAY'S TREATMENT:  Eval Only   PATIENT EDUCATION: Education details: PT plan of care; purpose of functional outcomes Person educated: Patient and Spouse Education method: Explanation and Verbal cues Education comprehension: verbalized understanding   HOME EXERCISE PROGRAM: To be reviewed and progressed next 1-2 visits    GOALS: Goals reviewed with patient? Yes  SHORT TERM GOALS: Target date: 01/11/2022  Pt will be independent with HEP in order to improve strength and balance in order to decrease fall risk and improve function at home and work.  Baseline: Patient reports not performing much in the way of any HEP except walking. Goal status: INITIAL  LONG TERM GOALS: Target date: 02/22/2022  Pt will decrease 5TSTS by at least 5 seconds in order to demonstrate clinically significant improvement in LE strength. Baseline: 02/28/2022: 19.5 sec with UE support   Goal status: INITIAL  2.  Pt will improve FOTO to target score of 48 to display perceived improvements in ability to complete ADL's.  Baseline: 02/28/22=40 Goal status: INITIAL  3.  Pt will decrease TUG to < or = to 25 seconds/decrease in order to demonstrate decreased fall risk. Baseline: 02/28/22= 37.26 sec using upright 4WW Goal status: INITIAL  4.  Pt will increase 10MWT by at least 0.13 m/s in order to demonstrate clinically significant improvement in community ambulation.   Baseline: 02/28/22= 0.67 m/s using upright 4WW Goal status: INITIAL  5.  Patient will increase six minute walk test distance to >1000 for progression to community ambulator and improve gait ability  Baseline: 02/28/22: 720 feet with up walker  Goal status: INITIAL  6.  Patient will experience 1 fall or less per 2 week period for at  least 3 weeks in order to improve his frequency of falls and  prevent injury from falls  Baseline: 02/28/22: pt falls approximately once every 10 days  Goal status: INITIAL  ASSESSMENT:  CLINICAL IMPRESSION: Patient presents with good motivation for completion of physical therapy activities.  Patient completed progressive balance obstacle course interventions with goal of practicing safe sit to stand transitions tensioning from sitting to using up walker completing various tasks and transitioning to sitting again.  Patient's caregiver reports his transitions are where majority of his falls are taking place.Pt will continue to benefit from skilled physical therapy intervention to address impairments, improve QOL, and attain therapy goals.     OBJECTIVE IMPAIRMENTS Abnormal gait, decreased activity tolerance, decreased balance, decreased cognition, decreased coordination, decreased endurance, decreased mobility, difficulty walking, decreased ROM, decreased strength, decreased safety awareness, hypomobility, impaired perceived functional ability, and pain.   ACTIVITY LIMITATIONS carrying, lifting, bending, standing, squatting, stairs, transfers, bed mobility, bathing, toileting, dressing, self feeding, hygiene/grooming, and caring for others  PARTICIPATION LIMITATIONS: meal prep, cleaning, laundry, medication management, personal finances, driving, shopping, community activity, and yard work  PERSONAL FACTORS Time since onset of injury/illness/exacerbation are also affecting patient's functional outcome.   REHAB POTENTIAL: Fair    CLINICAL DECISION MAKING: Evolving/moderate complexity  EVALUATION COMPLEXITY: Moderate  PLAN: PT FREQUENCY: 2x/week  PT DURATION: 12 weeks  PLANNED INTERVENTIONS: Therapeutic exercises, Therapeutic activity, Neuromuscular re-education, Balance training, Gait training, Patient/Family education, Self Care, Joint mobilization, Joint manipulation, Stair training,  Vestibular training, Canalith repositioning, DME instructions, Dry Needling, Electrical stimulation, Spinal manipulation, Spinal mobilization, Cryotherapy, Moist heat, Manual therapy, and Re-evaluation  PLAN FOR NEXT SESSION: Begin New HEP, work with pt regarding safety at home.    Particia Lather, PT 03/02/2022, 2:37 PM

## 2022-03-04 NOTE — Therapy (Signed)
Please disregard this note, note opened in error and no charge associated with this note and no visit counts associated with this note.

## 2022-03-05 NOTE — Therapy (Signed)
OUTPATIENT OCCUPATIONAL THERAPY NEURO TREATMENT  Patient Name: [name] MRN: [id number] DOB:1950-04-28, 71 y.o., male Today's Date: 03/05/2022  PCP: Dr. Elsie Stain REFERRING PROVIDER: Dr. Elsie Stain    OT End of Session - 03/02/22 1156       Visit Number 2    Number of Visits 24     Date for OT Re-Evaluation 05/23/22     OT Start Time 81    OT Stop Time 1145    OT Time Calculation (min) 45 min     Equipment Utilized During Treatment rollator     Activity Tolerance Patient tolerated treatment well     Behavior During Therapy WFL for tasks assessed/performed         Past Medical History:  Diagnosis Date   Arthritis    Bradycardia    Cancer ([place]) 2013   skin cancer   Depression    ptsd   Dysrhythmia    chronic slow heart rate   GERD (gastroesophageal reflux disease)    Headache(784.0)    tension headaches non recent   History of chicken pox    History of kidney stones    passed   Hypertension    treated with HCTZ   Pacemaker    Parkinson's disease    dx'ed 15 years ago   PTSD (post-traumatic stress disorder)    Shortness of breath dyspnea    Sleep apnea    doesn't use C-pap   Varicose veins    Past Surgical History:  Procedure Laterality Date   CHOLECYSTECTOMY N/A 10/22/2014   Procedure: LAPAROSCOPIC CHOLECYSTECTOMY WITH INTRAOPERATIVE CHOLANGIOGRAM;  Surgeon: Dia Crawford III, MD;  Location: [facility] ORS;  Service: General;  Laterality: N/A;   cyst removed      from lip as a child   INTRAMEDULLARY (IM) NAIL INTERTROCHANTERIC N/A 02/18/2019   Procedure: INTRAMEDULLARY (IM) NAIL INTERTROCHANTRIC, RIGHT,;  Surgeon: Thornton Park, MD;  Location: [facility] ORS;  Service: Orthopedics;  Laterality: N/A;   LUMBAR LAMINECTOMY/DECOMPRESSION MICRODISCECTOMY Bilateral 12/14/2012   Procedure: Bilateral lumbar three-four, four-five decompressive laminotomy/foraminotomy;  Surgeon: Charlie Pitter, MD;  Location: [place] [facility];  Service: Neurosurgery;  Laterality: Bilateral;    PULSE GENERATOR IMPLANT Bilateral 12/13/2013   Procedure: Bilateral implantable pulse generator placement;  Surgeon: Erline Levine, MD;  Location: [place] [facility];  Service: Neurosurgery;  Laterality: Bilateral;  Bilateral implantable pulse generator placement   skin cancer removed     from ears,   12 lft arm  rt leg 15   SUBTHALAMIC STIMULATOR BATTERY REPLACEMENT Bilateral 07/14/2017   Procedure: BILATERAL IMPLANTED PULSE GENERATOR CHANGE FOR DEEP BRAIN STIMULATOR;  Surgeon: Erline Levine, MD;  Location: [place];  Service: Neurosurgery;  Laterality: Bilateral;   SUBTHALAMIC STIMULATOR INSERTION Bilateral 12/06/2013   Procedure: SUBTHALAMIC STIMULATOR INSERTION;  Surgeon: Erline Levine, MD;  Location: [place] [facility]UR[facility];  Service: Neurosurgery;  Laterality: Bilateral;  Bilateral deep brain stimulator placement   Patient Active Problem List   Diagnosis Date Noted   Hematuria 10/10/2021   Sinus drainage 12/09/2020   Medicare annual wellness visit, subsequent 11/18/2020   Pacemaker    Dementia associated with Parkinson's disease ([place]) 06/17/2020   Vertigo 02/19/2020   Urine abnormality 02/19/2020   Dysphagia 02/23/2019   Fall at home 10/18/2017   REM behavioral disorder 11/02/2016   Radicular pain in right arm 10/09/2016   GERD (gastroesophageal reflux disease) 07/31/2016   Colon cancer screening 07/23/2015   Gout 02/18/2015   Depression 01/06/2015   Skin lesion 10/02/2014  Dupuytren's contracture 08/20/2014   Cough 07/21/2014   Lumbar stenosis with neurogenic claudication 06/13/2014   S/P deep brain stimulator placement 05/08/2014   Advance care planning 01/19/2014   Parkinson's disease 12/13/2013   PTSD (post-traumatic stress disorder) 06/13/2013   Erectile dysfunction 06/13/2013   HLD (hyperlipidemia) 06/13/2013   Essential hypertension 06/03/2013   Bradycardia by electrocardiogram 06/03/2013   Obstructive sleep apnea 03/12/2013    ONSET DATE: 2015  REFERRING DIAG: Parkinson's  Disease  THERAPY DIAG:  Muscle weakness (generalized)  Other lack of coordination  Parkinson's disease without dyskinesia or fluctuating manifestations  Rationale for Evaluation and Treatment: Rehabilitation  SUBJECTIVE:   SUBJECTIVE STATEMENT: Caregiver [name] reports that pt's new laptop is set up and they'd like for pt to be able to be more indep with use. Pt accompanied by:  spouse [name]) and caregiver Ambulance person)  PERTINENT HISTORY: Parkinson's Disease, dementia, DBS, multiple falls  PRECAUTIONS: Fall  WEIGHT BEARING RESTRICTIONS: No  PAIN:  Are you having pain? Yes: NPRS scale: 4/10 Pain location: hips and low back Pain description: sharp, tight Aggravating factors: if pt crosses feet too long/awkward positioning Relieving factors: voltaren gel   FALLS: Has patient fallen in last 6 months? Yes. Number of falls : spouse estimates 20  LIVING ENVIRONMENT: Lives with: lives with their spouse Lives in: house, 1 level Stairs: No Has following equipment at home: [place] entry, handicapped accessible bathroom  PLOF: Needs assistance with ADLs  PATIENT GOALS: increase bilat hand coordination for ADLs  OBJECTIVE:   HAND DOMINANCE: Right  ADLs: Overall ADLs: caregiver assist for all ADLs; pt currently as 16 hrs of caregiver assist per week through the [place].  Pt recently was assigned 100% disability by the [place], and spouse reports caregiver hours will increase to 46 per week, but uncertain when Transfers/ambulation related to ADLs: supv; pt uses upright 4 wheeled walker Eating: caregivers help to cut food Grooming: mod A to shave with an electric razor UB Dressing: min A, difficulty with buttons LB Dressing: mod A, difficulty with zippers and buttons Toileting: occasional accidents with bowel and bladder, but less than weekly and only during the night; distant supv for toileting during the day Bathing: min A for bathing, mod A for drying off Tub Shower transfers: min guard  for shower transfers with grab bar and shower chair Equipment:  fully handicapped shower and toilet  IADLs: Shopping: dep on caregiver Light housekeeping: dep Meal Prep: dep Community mobility: short distance community amb with supv and 4 wheeled walker Medication management: dep Financial management: dep Handwriting:  printed first/last name 50% legible, cursive first/last name illegible  MOBILITY STATUS: Hx of falls  POSTURE COMMENTS:  rounded shoulders, forward head, increased thoracic kyphosis, and flexed trunk   FUNCTIONAL OUTCOME MEASURES: FOTO: 40.2 (completed with PT); predicted 48  UPPER EXTREMITY ROM:   Pt demos BUE shoulder ROM WFL for self care  UPPER EXTREMITY MMT:     MMT Right eval Left eval  Shoulder flexion 5 5  Shoulder abduction 5 5  Shoulder adduction    Shoulder extension    Shoulder internal rotation    Shoulder external rotation    Middle trapezius    Lower trapezius    Elbow flexion 5 5  Elbow extension 5 5  Wrist flexion 5 5  Wrist extension 5 5  Wrist ulnar deviation    Wrist radial deviation    Wrist pronation    Wrist supination    (Blank rows = not tested)  HAND FUNCTION:  Grip strength: Right: 61 lbs; Left: 61 lbs, Lateral pinch: Right: 20 lbs, Left: 19 lbs, and 3 point pinch: Right: 11 lbs, Left: 15 lbs  COORDINATION: Finger Nose Finger test: increased time, L side touched lower than tip, corrected with min vc 9 Hole Peg test: Right: 1 min 18 sec; Left: 41 sec  SENSATION: Proprioception: Impaired   EDEMA: none  COGNITION: Overall cognitive status: History of cognitive impairments - at baseline; Parkinson's with dementia  VISION: Wears prism glasses; hx of visual hallucinations  PRAXIS: Impaired: Motor planning; bradykinesia, R>L  TODAY'S TREATMENT:                                                                                                                              DATE:  Therapeutic Exercise: Facilitated lateral  pinch strengthening for opening packages with paper tearing.  Reduced paper fold to 2 layers as 4 was challenging.  Facilitated pinch strengthening with use of therapy resistant clothespins to target lateral and 3 point pinch of R/L hands.  Pt able to manage all colors, clipping pins onto vertical dowel, requiring max visual and repeated tactile cues to formulate correct lateral pinch pattern.  Self Care: Practiced typing skills for laptop use.  Pt unable to manage 2 hand typing with hands resting on keyboard.  Cued pt to switch to "hunt and peck" with tactile cues to tuck digits 3-5 in each hand to reduce these digits hitting computer keys unintentionally.  This technique helped with accuracy.  Mouse skills addressed, but pt struggled with L/R side functions on clinic mouse.  Caregiver reported that pt has a different mouse at home.  Encouraged pt bring laptop/mouse in for practice during sessions if they'd like.   PATIENT EDUCATION: Education details: typing techniques  Person educated: Patient, Spouse, and Caregiver , [name] Education method: Explanation Education comprehension: verbalized understanding  HOME EXERCISE PROGRAM: To be established in the next 1-2 sessions   GOALS: Goals reviewed with patient? Yes  SHORT TERM GOALS: Target date: 04/11/22  1. Pt will complete HEP for increasing [place] and strength in bilat hands with direct supv at least 4 of 7 days per week. Baseline: not yet initiated Goal status: INITIAL  2. Pt will clean his glasses with min A and mod vc, including opening cleaning packet.   Baseline: max A  Goal status: initial     LONG TERM GOALS: Target date: 05/23/22  Pt will increase FOTO score by 5 or more points to indicate increase in self/caregiver perceived functional performance with daily tasks.  Baseline: 40.2 (predicted 48) Goal status: INITIAL  2.  Pt will increase R/L lateral pinch strength by 4 or more lbs to enable pt to independently turn on  electric toothbrush and twist open toothpaste. Baseline: R 20, L 19; caregiver assist to turn on toothbrush and open toothpaste Goal status: INITIAL  3.  Pt will increase [place] in bilat hands to manage buttons and zippers on clothing 75%  of the time.  Baseline: caregivers assist with clothing fasteners at least 50% of the time Goal status: INITIAL  4.  Pt will scoop food from plate/bowl with min vc using adapted utensils and adapted plates/bowls as needed.  Baseline: caregiver constantly turns plate to compensate for pt pushing food to 1 side. Goal status: INITIAL  5.  Pt will shave with electric razor with min A for thoroughness. Baseline: pt struggles to grade sufficient pressure to thoroughly shave face, requiring mod A. Goal status: INITIAL  ASSESSMENT:  CLINICAL IMPRESSION: Good tolerance to therapeutic exercises this date.  Practiced typing skills for laptop use.  Pt unable to manage 2 hand typing with hands resting on keyboard.  Cued pt to switch to "hunt and peck" with tactile cues to tuck digits 3-5 in each hand to reduce these digits hitting computer keys unintentionally.  This technique helped with accuracy.  Mouse skills addressed, but pt struggled with L/R side functions on clinic mouse.  Caregiver reported that pt has a different mouse at home.  Encouraged pt bring laptop/mouse in for practice during sessions if they'd like. Additional skilled OT will benefit pt to maximize hand strength and coordination for daily tasks, and provide strategies to maximize indep with ADLs and leisure activities to increase QOL.  PERFORMANCE DEFICITS: in functional skills including ADLs, IADLs, coordination, dexterity, proprioception, strength, pain, Fine motor control, Gross motor control, mobility, balance, body mechanics, endurance, decreased knowledge of use of DME, and UE functional use, cognitive skills including attention, learn, memory, orientation, problem solving, safety awareness, thought,  and understand. IMPAIRMENTS: are limiting patient from ADLs, IADLs, rest and sleep, leisure, and social participation.   CO-MORBIDITIES: has co-morbidities such as dementia  that affects occupational performance. Patient will benefit from skilled OT to address above impairments and improve overall function.  MODIFICATION OR ASSISTANCE TO COMPLETE EVALUATION: Min-Moderate modification of tasks or assist with assess necessary to complete an evaluation.  OT OCCUPATIONAL PROFILE AND HISTORY: Problem focused assessment: Including review of records relating to presenting problem.  CLINICAL DECISION MAKING: Moderate - several treatment options, min-mod task modification necessary  REHAB POTENTIAL: Good  EVALUATION COMPLEXITY: Moderate    PLAN:  OT FREQUENCY: 2x/week  OT DURATION: 12 weeks  PLANNED INTERVENTIONS: self care/ADL training, therapeutic exercise, therapeutic activity, neuromuscular re-education, manual therapy, balance training, functional mobility training, moist heat, cryotherapy, patient/family education, cognitive remediation/compensation, energy conservation, coping strategies training, and DME and/or AE instructions  RECOMMENDED OTHER SERVICES: N/A  CONSULTED AND AGREED WITH PLAN OF CARE: Patient and family member/caregiver  PLAN FOR NEXT SESSION: see eval  Leta Speller, MS, OTR/L  Darleene Cleaver, OT 03/05/2022, 5:59 PM

## 2022-03-07 ENCOUNTER — Ambulatory Visit: Payer: Medicare PPO

## 2022-03-07 DIAGNOSIS — D3131 Benign neoplasm of right choroid: Secondary | ICD-10-CM | POA: Diagnosis not present

## 2022-03-07 DIAGNOSIS — H04123 Dry eye syndrome of bilateral lacrimal glands: Secondary | ICD-10-CM | POA: Diagnosis not present

## 2022-03-07 DIAGNOSIS — H2513 Age-related nuclear cataract, bilateral: Secondary | ICD-10-CM | POA: Diagnosis not present

## 2022-03-07 DIAGNOSIS — H018 Other specified inflammations of eyelid: Secondary | ICD-10-CM | POA: Diagnosis not present

## 2022-03-07 DIAGNOSIS — H35033 Hypertensive retinopathy, bilateral: Secondary | ICD-10-CM | POA: Diagnosis not present

## 2022-03-08 ENCOUNTER — Telehealth: Payer: Self-pay | Admitting: Cardiology

## 2022-03-08 NOTE — Telephone Encounter (Signed)
Patient was seen by [facility] yesterday

## 2022-03-08 NOTE — Telephone Encounter (Signed)
Noted  Pt has an appointment scheduled for tomorrow 11/22

## 2022-03-08 NOTE — Therapy (Unsigned)
OUTPATIENT PHYSICAL THERAPY NEURO Treatment   Patient Name: [name] MRN: [id number] DOB:1951-01-29, 71 y.o., male Today's Date: 03/09/2022   PCP: Dr. Elsie Stain REFERRING PROVIDER: Dr. Sharolyn Douglas   PT End of Session - 03/09/22 1142     Visit Number 3    Number of Visits 24    Date for PT Re-Evaluation 05/23/22    Authorization Type [employer] Choice PPO    Authorization Time Period Recert 07/01/1759-60/10/3708    Progress Note Due on Visit 10    PT Start Time 1145    PT Stop Time 1228    PT Time Calculation (min) 43 min    Equipment Utilized During Treatment Gait belt    Activity Tolerance Patient tolerated treatment well    Behavior During Therapy WFL for tasks assessed/performed                 Past Medical History:  Diagnosis Date   Arthritis    Bradycardia    Cancer ([place]) 2013   skin cancer   Depression    ptsd   Dysrhythmia    chronic slow heart rate   GERD (gastroesophageal reflux disease)    Headache(784.0)    tension headaches non recent   History of chicken pox    History of kidney stones    passed   Hypertension    treated with HCTZ   Pacemaker    Parkinson's disease    dx'ed 15 years ago   PTSD (post-traumatic stress disorder)    Shortness of breath dyspnea    Sleep apnea    doesn't use C-pap   Varicose veins    Past Surgical History:  Procedure Laterality Date   CHOLECYSTECTOMY N/A 10/22/2014   Procedure: LAPAROSCOPIC CHOLECYSTECTOMY WITH INTRAOPERATIVE CHOLANGIOGRAM;  Surgeon: Dia Crawford III, MD;  Location: [facility];  Service: General;  Laterality: N/A;   cyst removed      from lip as a child   INTRAMEDULLARY (IM) NAIL INTERTROCHANTERIC N/A 02/18/2019   Procedure: INTRAMEDULLARY (IM) NAIL INTERTROCHANTRIC, RIGHT,;  Surgeon: Thornton Park, MD;  Location: [facility];  Service: Orthopedics;  Laterality: N/A;   LUMBAR LAMINECTOMY/DECOMPRESSION MICRODISCECTOMY Bilateral 12/14/2012   Procedure: Bilateral lumbar three-four,  four-five decompressive laminotomy/foraminotomy;  Surgeon: Charlie Pitter, MD;  Location: [place] [facility];  Service: Neurosurgery;  Laterality: Bilateral;   PULSE GENERATOR IMPLANT Bilateral 12/13/2013   Procedure: Bilateral implantable pulse generator placement;  Surgeon: Erline Levine, MD;  Location: [place] [facility];  Service: Neurosurgery;  Laterality: Bilateral;  Bilateral implantable pulse generator placement   skin cancer removed     from ears,   12 lft arm  rt leg 15   SUBTHALAMIC STIMULATOR BATTERY REPLACEMENT Bilateral 07/14/2017   Procedure: BILATERAL IMPLANTED PULSE GENERATOR CHANGE FOR DEEP BRAIN STIMULATOR;  Surgeon: Erline Levine, MD;  Location: [place];  Service: Neurosurgery;  Laterality: Bilateral;   SUBTHALAMIC STIMULATOR INSERTION Bilateral 12/06/2013   Procedure: SUBTHALAMIC STIMULATOR INSERTION;  Surgeon: Erline Levine, MD;  Location: [place] [facility];  Service: Neurosurgery;  Laterality: Bilateral;  Bilateral deep brain stimulator placement   Patient Active Problem List   Diagnosis Date Noted   Hematuria 10/10/2021   Sinus drainage 12/09/2020   Medicare annual wellness visit, subsequent 11/18/2020   Pacemaker    Dementia associated with Parkinson's disease ([place]) 06/17/2020   Vertigo 02/19/2020   Urine abnormality 02/19/2020   Dysphagia 02/23/2019   Fall at home 10/18/2017   REM behavioral disorder 11/02/2016   Radicular pain in right arm  10/09/2016   GERD (gastroesophageal reflux disease) 07/31/2016   Colon cancer screening 07/23/2015   Gout 02/18/2015   Depression 01/06/2015   Skin lesion 10/02/2014   Dupuytren's contracture 08/20/2014   Cough 07/21/2014   Lumbar stenosis with neurogenic claudication 06/13/2014   S/P deep brain stimulator placement 05/08/2014   Advance care planning 01/19/2014   Parkinson's disease 12/13/2013   PTSD (post-traumatic stress disorder) 06/13/2013   Erectile dysfunction 06/13/2013   HLD (hyperlipidemia) 06/13/2013   Essential hypertension  06/03/2013   Bradycardia by electrocardiogram 06/03/2013   Obstructive sleep apnea 03/12/2013    ONSET DATE: 15 years ago per chart  REFERRING DIAG: Parkinson's disease  THERAPY DIAG:  Muscle weakness (generalized)  Difficulty in walking, not elsewhere classified  Parkinson's disease without dyskinesia or fluctuating manifestations  Unsteadiness on feet  Repeated falls  Rationale for Evaluation and Treatment Rehabilitation  SUBJECTIVE:                                                                                                                                                                                              SUBJECTIVE STATEMENT: Pt caregiver reports one fall since last visit. He was transitioning to standing and tried to pick up something form the floor. He continues to try to be very independent despite falls.    Resting: 123/90 with HR 61 Pt accompanied by: significant other  PERTINENT HISTORY:   Date of most recent Admission: 09/25/2021 Date of Discharge: 09/28/2021  Discharge Diagnoses:  Altered mental status Parkinson disease (CMS/HCC) Hallucinations  Per [facility] discharge summary notes PHI : Hospital Course:   71 y.o. male with a history of Parkinson's Disease w/ DBS in place, PD-related dementia, OSA, depression, and seizure disorder who presented for EEG given concern for seizures.  Altered mental status Concern seizure activity Periods of unresponsiveness on the setting of new medication--Nuplazid (pimavanserin) recently started. No events of note on continuous EEG. This was held and he returned to his baseline mental status. Other workup including CT head, CTA h/n without obvious cause for periodic unresponsiveness. Nuplazid was discontinued.   Aspiration, weakness Of note, he had a coughing spell while eating though sounds like was precipitated by sneezing. Did well on RA, seen by SLP without any further needs or concerns.  Parkinson's  Disease Parkinson's-related dementia - He follows in the [facility] center at [facility]. Clinic notes suggest he seems to have worsening of his overall disease trajectory. - Continued home Memantine and Sinemet      PMH: HTN, bradycardia s/p PPM, PD, DBS, COVID-19 infection, chronic low back pain s/p L4/5 surgery.  PAIN:  Are  you having pain? Yes: NPRS scale: 7/10 Pain location: Low back Pain description: ache Aggravating factors: Prolonged standing/sitting, bending over Relieving factors: rest, changing positions  PRECAUTIONS: Fall  WEIGHT BEARING RESTRICTIONS No  FALLS: Has patient fallen in last 6 months? Yes. Number of falls >10  LIVING ENVIRONMENT: Lives with: lives with their spouse Lives in: House/apartment Stairs:  Ramp Has following equipment at home: Single point cane, Environmental consultant - 4 wheeled, Wheelchair (manual), shower chair, bed side commode, Grab bars, and Ramped entry  PLOF: Needs assistance with ADLs, Needs assistance with homemaking, Needs assistance with gait, and Needs assistance with transfers  PATIENT GOALS To walk better and not fall. Improve fall frequency to prevent significant injury   OBJECTIVE:   DIAGNOSTIC FINDINGS:   COGNITION: Overall cognitive status: History of cognitive impairments - at baseline   SENSATION: Light touch: WFL  COORDINATION: Decreased and slow to initiate  EDEMA:  None observed     POSTURE: rounded shoulders, forward head, increased thoracic kyphosis, and flexed trunk      TRANSFERS: Assistive device utilized:  Upright 4WW   Sit to stand:  Min/mod assist- with increased VC for safety Stand to sit:  Min assist with Verbal cues to lock brakes and reach back Chair to chair: Min A     GAIT: Gait pattern: decreased arm swing- Right, decreased arm swing- Left, decreased step length- Right, decreased step length- Left, decreased stance time- Right, decreased stance time- Left, decreased stride length, and  shuffling Distance walked: 150 ft. Assistive device utilized:  upright 4WW Level of assistance: Min A Comments: Shuffling gait with slow gait speed  FUNCTIONAL TESTs:  5 times sit to stand: 19.5 sec with UE support with BUE support Timed up and go (TUG): 37.26 sec with upright 4WW 6 minute walk test: 720 feet using upright 4WW 10 meter walk test: 0.67 m/s  PATIENT SURVEYS:  FOTO 50  TODAY'S TREATMENT:  Balance obstacel course including STS transfer, basketball shot, picking up objects, boxing, and cone weaving. Difficulty with memory of tasks anc cues to limit impulsivity- completed many reps.   PWR! Step 2 x 10 ea LE   Standing cone pickup from step 2 x 10 with squats each round.   Unless otherwise stated, CGA was provided and gait belt donned in order to ensure pt safety    PATIENT EDUCATION: Education details: PT plan of care; purpose of functional outcomes Person educated: Patient and Spouse Education method: Explanation and Verbal cues Education comprehension: verbalized understanding   HOME EXERCISE PROGRAM: To be reviewed and progressed next 1-2 visits    GOALS: Goals reviewed with patient? Yes  SHORT TERM GOALS: Target date: 01/11/2022  Pt will be independent with HEP in order to improve strength and balance in order to decrease fall risk and improve function at home and work.  Baseline: Patient reports not performing much in the way of any HEP except walking. Goal status: INITIAL  LONG TERM GOALS: Target date: 02/22/2022  Pt will decrease 5TSTS by at least 5 seconds in order to demonstrate clinically significant improvement in LE strength. Baseline: 02/28/2022: 19.5 sec with UE support   Goal status: INITIAL  2.  Pt will improve FOTO to target score of 48 to display perceived improvements in ability to complete ADL's.  Baseline: 02/28/22=40 Goal status: INITIAL  3.  Pt will decrease TUG to < or = to 25 seconds/decrease in order to demonstrate decreased  fall risk. Baseline: 02/28/22= 37.26 sec using upright 4WW Goal status:  INITIAL  4.  Pt will increase 10MWT by at least 0.13 m/s in order to demonstrate clinically significant improvement in community ambulation.   Baseline: 02/28/22= 0.67 m/s using upright 4WW Goal status: INITIAL  5.  Patient will increase six minute walk test distance to >1000 for progression to community ambulator and improve gait ability  Baseline: 02/28/22: 720 feet with up walker  Goal status: INITIAL  6.  Patient will experience 1 fall or less per 2 week period for at least 3 weeks in order to improve his frequency of falls and prevent injury from falls  Baseline: 02/28/22: pt falls approximately once every 10 days  Goal status: INITIAL  ASSESSMENT:  CLINICAL IMPRESSION: Patient presents with good motivation for completion of physical therapy activities.  Continued with balance obstacle course to focus on functional tasks that have caused falls like STS transitions with up walker. Also worked on picking up objects while also targeting functional LE strength with cone pickup as pt has to squat each rep. Pt does require intermittent cues for safety with tasks performed.   Pt will continue to benefit from skilled physical therapy intervention to address impairments, improve QOL, and attain therapy goals.     OBJECTIVE IMPAIRMENTS Abnormal gait, decreased activity tolerance, decreased balance, decreased cognition, decreased coordination, decreased endurance, decreased mobility, difficulty walking, decreased ROM, decreased strength, decreased safety awareness, hypomobility, impaired perceived functional ability, and pain.   ACTIVITY LIMITATIONS carrying, lifting, bending, standing, squatting, stairs, transfers, bed mobility, bathing, toileting, dressing, self feeding, hygiene/grooming, and caring for others  PARTICIPATION LIMITATIONS: meal prep, cleaning, laundry, medication management, personal finances, driving,  shopping, community activity, and yard work  PERSONAL FACTORS Time since onset of injury/illness/exacerbation are also affecting patient's functional outcome.   REHAB POTENTIAL: Fair    CLINICAL DECISION MAKING: Evolving/moderate complexity  EVALUATION COMPLEXITY: Moderate  PLAN: PT FREQUENCY: 2x/week  PT DURATION: 12 weeks  PLANNED INTERVENTIONS: Therapeutic exercises, Therapeutic activity, Neuromuscular re-education, Balance training, Gait training, Patient/Family education, Self Care, Joint mobilization, Joint manipulation, Stair training, Vestibular training, Canalith repositioning, DME instructions, Dry Needling, Electrical stimulation, Spinal manipulation, Spinal mobilization, Cryotherapy, Moist heat, Manual therapy, and Re-evaluation  PLAN FOR NEXT SESSION: Begin New HEP, work with pt regarding safety at home.    Particia Lather, PT 03/09/2022, 11:44 AM

## 2022-03-08 NOTE — Telephone Encounter (Signed)
Pt c/o BP issue: STAT if pt c/o blurred vision, one-sided weakness or slurred speech  1. What are your last 5 BP readings? Yesterday 168/80 One day last week 140/80  2. Are you having any other symptoms (ex. Dizziness, headache, blurred vision, passed out)? No symptoms, other than it seems to run high when he is at the doctors office. Wife also states they do not check it at home.   3. What is your BP issue? Ophthalmologist that patient saw yesterday states "she saw some indication in the back of his eye that his BP has been running high"

## 2022-03-08 NOTE — Telephone Encounter (Signed)
Patient did schedule tomorrow to see Dr. Rockey Situ

## 2022-03-09 ENCOUNTER — Encounter: Payer: Self-pay | Admitting: Physical Therapy

## 2022-03-09 ENCOUNTER — Ambulatory Visit: Payer: Medicare PPO | Attending: Cardiovascular Disease | Admitting: Cardiovascular Disease

## 2022-03-09 ENCOUNTER — Encounter: Payer: Self-pay | Admitting: Cardiovascular Disease

## 2022-03-09 ENCOUNTER — Ambulatory Visit: Payer: Medicare PPO | Admitting: Physical Therapy

## 2022-03-09 ENCOUNTER — Ambulatory Visit: Payer: Medicare PPO | Admitting: Occupational Therapy

## 2022-03-09 VITALS — BP 130/80 | HR 60 | Ht 69.0 in | Wt 186.2 lb

## 2022-03-09 DIAGNOSIS — R001 Bradycardia, unspecified: Secondary | ICD-10-CM | POA: Diagnosis not present

## 2022-03-09 DIAGNOSIS — G20B1 Parkinson's disease with dyskinesia, without mention of fluctuations: Secondary | ICD-10-CM | POA: Diagnosis not present

## 2022-03-09 DIAGNOSIS — G20A1 Parkinson's disease without dyskinesia, without mention of fluctuations: Secondary | ICD-10-CM

## 2022-03-09 DIAGNOSIS — R296 Repeated falls: Secondary | ICD-10-CM

## 2022-03-09 DIAGNOSIS — R2681 Unsteadiness on feet: Secondary | ICD-10-CM

## 2022-03-09 DIAGNOSIS — I10 Essential (primary) hypertension: Secondary | ICD-10-CM | POA: Diagnosis not present

## 2022-03-09 DIAGNOSIS — M6281 Muscle weakness (generalized): Secondary | ICD-10-CM

## 2022-03-09 DIAGNOSIS — R278 Other lack of coordination: Secondary | ICD-10-CM | POA: Diagnosis not present

## 2022-03-09 DIAGNOSIS — R2689 Other abnormalities of gait and mobility: Secondary | ICD-10-CM | POA: Diagnosis not present

## 2022-03-09 DIAGNOSIS — R262 Difficulty in walking, not elsewhere classified: Secondary | ICD-10-CM | POA: Diagnosis not present

## 2022-03-09 DIAGNOSIS — E782 Mixed hyperlipidemia: Secondary | ICD-10-CM

## 2022-03-09 DIAGNOSIS — Z950 Presence of cardiac pacemaker: Secondary | ICD-10-CM | POA: Diagnosis not present

## 2022-03-09 DIAGNOSIS — R269 Unspecified abnormalities of gait and mobility: Secondary | ICD-10-CM | POA: Diagnosis not present

## 2022-03-09 NOTE — Progress Notes (Signed)
Cardiology Office Note  Date:  03/09/2022   ID:  [name], DOB [birth year], MRN [id number]  PCP:  Tonia Ghent, MD   Chief Complaint  Patient presents with   Hypertension    HPI:  [name] is a pleasant 71 -year-old male year-old gentleman with  Parkinson's for more than 10 years,  Hypertension, chronic bradycardia , pacemaker placement of deep brain stimulator August 2015 for tremor followup [facility] hyperlipidemia, depression, possible seizure disorder Long-standing history of heart rate 40-50 bpm and in general has been asymptomatic EF 55 to 60% Long history of Parkinson's associated orthostasis, neurocardiogenic who presents for routine follow-up of his blood pressure and bradycardia, hypertension.   LOV 9/22 Doing well, uses walker Mobility is limited, able to get out with assistance from his caretakers and his wife Followed at [place], disability from [place] 100%, recent change Wife will now qualify for caregiver money  Saw eye doctor at [facility], concern for HTN Not checking BP at home Weight stable  Dr. Quentin Ore monitoring the pacer AT/AF burden <.1%,   Has not been needing midodrine for orthostasis  06/2020: deep brain stimulator replaced, placed on the right Chronic back pain   Other past medical history reviewed Ziopatch monitor showed episodes of supraventricular tachycardia superimposed on a baseline of bradycardia. Seen by EP 11/2019 Plan to repeat Zio in 6 months  CT scan of the head 05/29/2013 showing mild chronic inflammatory disease of the sinuses. This was done for right sided headache. Work done 05/29/2013 includes basic     PMH:   has a past medical history of Arthritis, Bradycardia, Cancer ([place]) (2013), Depression, Dysrhythmia, GERD (gastroesophageal reflux disease), Headache(784.0), History of chicken pox, History of kidney stones, Hypertension, Pacemaker, Parkinson's disease, PTSD (post-traumatic stress disorder), Shortness of breath  dyspnea, Sleep apnea, and Varicose veins.  PSH:    Past Surgical History:  Procedure Laterality Date   CHOLECYSTECTOMY N/A 10/22/2014   Procedure: LAPAROSCOPIC CHOLECYSTECTOMY WITH INTRAOPERATIVE CHOLANGIOGRAM;  Surgeon: Dia Crawford III, MD;  Location: [facility] OR[facility];  Service: General;  Laterality: N/A;   cyst removed      from lip as a child   INTRAMEDULLARY (IM) NAIL INTERTROCHANTERIC N/A 02/18/2019   Procedure: INTRAMEDULLARY (IM) NAIL INTERTROCHANTRIC, RIGHT,;  Surgeon: Thornton Park, MD;  Location: [facility];  Service: Orthopedics;  Laterality: N/A;   LUMBAR LAMINECTOMY/DECOMPRESSION MICRODISCECTOMY Bilateral 12/14/2012   Procedure: Bilateral lumbar three-four, four-five decompressive laminotomy/foraminotomy;  Surgeon: Charlie Pitter, MD;  Location: [place] [facility];  Service: Neurosurgery;  Laterality: Bilateral;   PULSE GENERATOR IMPLANT Bilateral 12/13/2013   Procedure: Bilateral implantable pulse generator placement;  Surgeon: Erline Levine, MD;  Location: [place] [facility];  Service: Neurosurgery;  Laterality: Bilateral;  Bilateral implantable pulse generator placement   skin cancer removed     from ears,   12 lft arm  rt leg 15   SUBTHALAMIC STIMULATOR BATTERY REPLACEMENT Bilateral 07/14/2017   Procedure: BILATERAL IMPLANTED PULSE GENERATOR CHANGE FOR DEEP BRAIN STIMULATOR;  Surgeon: Erline Levine, MD;  Location: [place];  Service: Neurosurgery;  Laterality: Bilateral;   SUBTHALAMIC STIMULATOR INSERTION Bilateral 12/06/2013   Procedure: SUBTHALAMIC STIMULATOR INSERTION;  Surgeon: Erline Levine, MD;  Location: [place] [facility];  Service: Neurosurgery;  Laterality: Bilateral;  Bilateral deep brain stimulator placement    Current Outpatient Medications  Medication Sig Dispense Refill   acetaminophen (TYLENOL) 325 MG tablet Take 650 mg by mouth every 6 (six) hours as needed.     ARIPiprazole (ABILIFY) 2 MG tablet Take  2 mg by mouth daily. Taking 1/2 by mouth per [facility]     aspirin EC 81 MG tablet Take 1 tablet  (81 mg total) by mouth daily. Swallow whole. 90 tablet 3   Carbidopa-Levodopa ER (RYTARY) 48.75-195 MG CPCR Take 2 capsules by mouth 3 (three) times daily.     cholecalciferol (VITAMIN D) 1000 UNITS tablet Take 1,000 Units by mouth daily.     Cranberry 200 MG CAPS Take by mouth.     cycloSPORINE (RESTASIS) 0.05 % ophthalmic emulsion SMARTSIG:In Eye(s)     diclofenac Sodium (VOLTAREN) 1 % GEL Apply topically 4 (four) times daily.     escitalopram (LEXAPRO) 20 MG tablet TAKE 1 TABLET DAILY 90 tablet 0   fluticasone (FLONASE) 50 MCG/ACT nasal spray Place 2 sprays into both nostrils daily. 48 g 1   Melatonin 10 MG TABS Take 15 mg by mouth at bedtime.     memantine (NAMENDA) 10 MG tablet TAKE 1 TABLET TWICE A DAY 180 tablet 3   midodrine (PROAMATINE) 5 MG tablet Take 1 tablet (5 mg total) by mouth as needed. For orthostasis, drops in pressure <100 when standing 90 tablet 1   Nystatin POWD Use twice a day if needed.     omeprazole (PRILOSEC) 20 MG capsule TAKE 1 CAPSULE DAILY 90 capsule 3   rivastigmine (EXELON) 9.5 mg/24hr 9.5 mg daily.     cephALEXin (KEFLEX) 500 MG capsule Take 1 capsule (500 mg total) by mouth 3 (three) times daily. (Patient not taking: Reported on 02/28/2022) 21 capsule 0   No current facility-administered medications for this visit.     Allergies:   Clonazepam, Morphine and related, Nuplazid [pimavanserin tartrate], Pimavanserin, Prednisone, and Tramadol   Social History:  The patient  reports that he has never smoked. He has quit using smokeless tobacco. He reports current alcohol use. He reports that he does not use drugs.   Family History:   family history includes Lung cancer in his father.    Review of Systems: Review of Systems  HENT: Negative.    Respiratory: Negative.    Cardiovascular: Negative.   Gastrointestinal: Negative.   Musculoskeletal:        Unsteady gait  Neurological:  Positive for weakness.  Psychiatric/Behavioral: Negative.    All other  systems reviewed and are negative.   PHYSICAL EXAM: VS:  BP 130/80 (BP Location: Left Arm, Patient Position: Sitting, Cuff Size: Normal)   Pulse 60   Ht '5\' 9"'$  (1.753 m)   Wt 186 lb 4 oz (84.5 kg)   SpO2 98%   BMI 27.50 kg/m  , BMI Body mass index is 27.5 kg/m. Constitutional:  oriented to person, place, and time. No distress.  HENT:  Head: Grossly normal Eyes:  no discharge. No scleral icterus.  Neck: No JVD, no carotid bruits  Cardiovascular: Regular rate and rhythm, no murmurs appreciated Pulmonary/Chest: Clear to auscultation bilaterally, no wheezes or rails Abdominal: Soft.  no distension.  no tenderness.  Musculoskeletal: Normal range of motion Neurological:  normal muscle tone. Coordination normal. No atrophy Skin: Skin warm and dry Psychiatric: normal affect, pleasant   Recent Labs: 12/22/2021: BUN 16; Creatinine, Ser 0.80; Hemoglobin 12.8; Platelets 204; Potassium 3.8; Sodium 140    Lipid Panel Lab Results  Component Value Date   CHOL 182 11/06/2020   HDL 37.10 (L) 11/06/2020   LDLCALC 109 (H) 11/06/2020   TRIG 182.0 (H) 11/06/2020    Wt Readings from Last 3 Encounters:  03/09/22 186 lb 4  oz (84.5 kg)  12/22/21 167 lb (75.8 kg)  10/07/21 180 lb (81.6 kg)     ASSESSMENT AND PLAN:  Mixed hyperlipidemia  Reasonable numbers, currently not on therapy  Essential hypertension - Long history of orthostasis, will allow for liberal blood pressure He will monitor blood pressure at home and call us if numbers run high but we have recommended he also check orthostatics  Spinal stenosis, lumbar region, with neurogenic claudication -  Chronic back pain Stimulator in place, on right Moving with a walker  Bradycardia Followed by EP, pacer  may 2022 Stable  Orthostasis Has not required midodrine but they have not been checking blood pressure at home In the past he has not appreciated orthostasis symptoms, typically this will manifest as slow gait  Total  encounter time more than 30 minutes Greater than 50% was spent in counseling and coordination of care with the patient     No orders of the defined types were placed in this encounter.    Signed, Esmond Plants, M.D., Ph.D. 03/09/2022  [place], [place]

## 2022-03-09 NOTE — Therapy (Signed)
OUTPATIENT OCCUPATIONAL THERAPY NEURO TREATMENT  Patient Name: [name] MRN: [id number] DOB:[birth year], 71 y.o., male Today's Date: 03/05/2022  PCP: Dr. Elsie Stain REFERRING PROVIDER: Dr. Elsie Stain    OT End of Session - 03/09/22 1244     Visit Number 3    Number of Visits 24    Date for OT Re-Evaluation 05/23/22    OT Start Time 1100    OT Stop Time 1145    OT Time Calculation (min) 45 min    Activity Tolerance Patient tolerated treatment well    Behavior During Therapy [facility] for tasks assessed/performed              Past Medical History:  Diagnosis Date   Arthritis    Bradycardia    Cancer ([place]) 2013   skin cancer   Depression    ptsd   Dysrhythmia    chronic slow heart rate   GERD (gastroesophageal reflux disease)    Headache(784.0)    tension headaches non recent   History of chicken pox    History of kidney stones    passed   Hypertension    treated with HCTZ   Pacemaker    Parkinson's disease    dx'ed 15 years ago   PTSD (post-traumatic stress disorder)    Shortness of breath dyspnea    Sleep apnea    doesn't use C-pap   Varicose veins    Past Surgical History:  Procedure Laterality Date   CHOLECYSTECTOMY N/A 10/22/2014   Procedure: LAPAROSCOPIC CHOLECYSTECTOMY WITH INTRAOPERATIVE CHOLANGIOGRAM;  Surgeon: Dia Crawford III, MD;  Location: [facility] ORS;  Service: General;  Laterality: N/A;   cyst removed      from lip as a child   INTRAMEDULLARY (IM) NAIL INTERTROCHANTERIC N/A 02/18/2019   Procedure: INTRAMEDULLARY (IM) NAIL INTERTROCHANTRIC, RIGHT,;  Surgeon: Thornton Park, MD;  Location: [facility] ORS;  Service: Orthopedics;  Laterality: N/A;   LUMBAR LAMINECTOMY/DECOMPRESSION MICRODISCECTOMY Bilateral 12/14/2012   Procedure: Bilateral lumbar three-four, four-five decompressive laminotomy/foraminotomy;  Surgeon: Charlie Pitter, MD;  Location: [place] [facility]URO [facility];  Service: Neurosurgery;  Laterality: Bilateral;   PULSE GENERATOR IMPLANT Bilateral 12/13/2013    Procedure: Bilateral implantable pulse generator placement;  Surgeon: Erline Levine, MD;  Location: [place] [facility];  Service: Neurosurgery;  Laterality: Bilateral;  Bilateral implantable pulse generator placement   skin cancer removed     from ears,   12 lft arm  rt leg 15   SUBTHALAMIC STIMULATOR BATTERY REPLACEMENT Bilateral 07/14/2017   Procedure: BILATERAL IMPLANTED PULSE GENERATOR CHANGE FOR DEEP BRAIN STIMULATOR;  Surgeon: Erline Levine, MD;  Location: [facility];  Service: Neurosurgery;  Laterality: Bilateral;   SUBTHALAMIC STIMULATOR INSERTION Bilateral 12/06/2013   Procedure: SUBTHALAMIC STIMULATOR INSERTION;  Surgeon: Erline Levine, MD;  Location: [place] [facility];  Service: Neurosurgery;  Laterality: Bilateral;  Bilateral deep brain stimulator placement   Patient Active Problem List   Diagnosis Date Noted   Hematuria 10/10/2021   Sinus drainage 12/09/2020   Medicare annual wellness visit, subsequent 11/18/2020   Pacemaker    Dementia associated with Parkinson's disease ([place]) 06/17/2020   Vertigo 02/19/2020   Urine abnormality 02/19/2020   Dysphagia 02/23/2019   Fall at home 10/18/2017   REM behavioral disorder 11/02/2016   Radicular pain in right arm 10/09/2016   GERD (gastroesophageal reflux disease) 07/31/2016   Colon cancer screening 07/23/2015   Gout 02/18/2015   Depression 01/06/2015   Skin lesion 10/02/2014   Dupuytren's contracture 08/20/2014   Cough 07/21/2014   Lumbar  stenosis with neurogenic claudication 06/13/2014   S/P deep brain stimulator placement 05/08/2014   Advance care planning 01/19/2014   Parkinson's disease 12/13/2013   PTSD (post-traumatic stress disorder) 06/13/2013   Erectile dysfunction 06/13/2013   HLD (hyperlipidemia) 06/13/2013   Essential hypertension 06/03/2013   Bradycardia by electrocardiogram 06/03/2013   Obstructive sleep apnea 03/12/2013    ONSET DATE: 2015  REFERRING DIAG: Parkinson's Disease  THERAPY DIAG:  Muscle weakness  (generalized)  Other lack of coordination  Parkinson's disease without dyskinesia or fluctuating manifestations  Rationale for Evaluation and Treatment: Rehabilitation  SUBJECTIVE:   SUBJECTIVE STATEMENT: Caregiver [name] reports that pt's new laptop is set up and they'd like for pt to be able to be more indep with use. Pt accompanied by:  spouse [name]) and caregiver Ambulance person)  PERTINENT HISTORY: Parkinson's Disease, dementia, DBS, multiple falls  PRECAUTIONS: Fall  WEIGHT BEARING RESTRICTIONS: No  PAIN:  Are you having pain? Yes: NPRS scale: 4/10 Pain location: hips and low back Pain description: sharp, tight Aggravating factors: if pt crosses feet too long/awkward positioning Relieving factors: voltaren gel   FALLS: Has patient fallen in last 6 months? Yes. Number of falls : spouse estimates 20  LIVING ENVIRONMENT: Lives with: lives with their spouse Lives in: house, 1 level Stairs: No Has following equipment at home: [place] entry, handicapped accessible bathroom  PLOF: Needs assistance with ADLs  PATIENT GOALS: increase bilat hand coordination for ADLs  OBJECTIVE:   HAND DOMINANCE: Right  ADLs: Overall ADLs: caregiver assist for all ADLs; pt currently as 16 hrs of caregiver assist per week through the [place].  Pt recently was assigned 100% disability by the [place], and spouse reports caregiver hours will increase to 46 per week, but uncertain when Transfers/ambulation related to ADLs: supv; pt uses upright 4 wheeled walker Eating: caregivers help to cut food Grooming: mod A to shave with an electric razor UB Dressing: min A, difficulty with buttons LB Dressing: mod A, difficulty with zippers and buttons Toileting: occasional accidents with bowel and bladder, but less than weekly and only during the night; distant supv for toileting during the day Bathing: min A for bathing, mod A for drying off Tub Shower transfers: min guard for shower transfers with grab bar and  shower chair Equipment:  fully handicapped shower and toilet  IADLs: Shopping: dep on caregiver Light housekeeping: dep Meal Prep: dep Community mobility: short distance community amb with supv and 4 wheeled walker Medication management: dep Financial management: dep Handwriting:  printed first/last name 50% legible, cursive first/last name illegible  MOBILITY STATUS: Hx of falls  POSTURE COMMENTS:  rounded shoulders, forward head, increased thoracic kyphosis, and flexed trunk   FUNCTIONAL OUTCOME MEASURES: FOTO: 40.2 (completed with PT); predicted 48  UPPER EXTREMITY ROM:   Pt demos BUE shoulder ROM WFL for self care  UPPER EXTREMITY MMT:     MMT Right eval Left eval  Shoulder flexion 5 5  Shoulder abduction 5 5  Shoulder adduction    Shoulder extension    Shoulder internal rotation    Shoulder external rotation    Middle trapezius    Lower trapezius    Elbow flexion 5 5  Elbow extension 5 5  Wrist flexion 5 5  Wrist extension 5 5  Wrist ulnar deviation    Wrist radial deviation    Wrist pronation    Wrist supination    (Blank rows = not tested)  HAND FUNCTION: Grip strength: Right: 61 lbs; Left: 61 lbs, Lateral pinch:  Right: 20 lbs, Left: 19 lbs, and 3 point pinch: Right: 11 lbs, Left: 15 lbs  COORDINATION: Finger Nose Finger test: increased time, L side touched lower than tip, corrected with min vc 9 Hole Peg test: Right: 1 min 18 sec; Left: 41 sec  SENSATION: Proprioception: Impaired   EDEMA: none  COGNITION: Overall cognitive status: History of cognitive impairments - at baseline; Parkinson's with dementia  VISION: Wears prism glasses; hx of visual hallucinations  PRAXIS: Impaired: Motor planning; bradykinesia, R>L  TODAY'S TREATMENT:                                                                                                                              DATE:   Pt. worked on navigating his new Iphone. Pt. worked on accessing entry to the  cell phone using Face ID. Pt. worked on accessing, formulating, and sending a general text message to his wife. Pt. worked on the steps needed to set an alarm. Pt. worked on the process of preparing to submit a FaceTime call.  Pt. Attempted to work on the process for accessing the internet on his phone, however internet accessibility via the phone was limited in the facility.  Pt. Required increased time, and cues for accessing each App on the Iphone. Pt. required increased time, verbal and visual cues for formulating a text message to his wife. Pt. presented with difficulty efficiently lifting his 2nd digit to type each letter individually. Pt.'s finger appeared to drag across the keyboard screen resulting in him often typing the letters around the desired letter. Pt. Continues work on efficiently navigating, and accessing his Iphone.  PATIENT EDUCATION: Education details: typing techniques  Person educated: Patient, Spouse, and Caregiver , [name] Education method: Explanation Education comprehension: verbalized understanding  HOME EXERCISE PROGRAM: To be established in the next 1-2 sessions   GOALS: Goals reviewed with patient? Yes  SHORT TERM GOALS: Target date: 04/11/22  1. Pt will complete HEP for increasing [place] and strength in bilat hands with direct supv at least 4 of 7 days per week. Baseline: not yet initiated Goal status: INITIAL  2. Pt will clean his glasses with min A and mod vc, including opening cleaning packet.   Baseline: max A  Goal status: initial     LONG TERM GOALS: Target date: 05/23/22  Pt will increase FOTO score by 5 or more points to indicate increase in self/caregiver perceived functional performance with daily tasks.  Baseline: 40.2 (predicted 48) Goal status: INITIAL  2.  Pt will increase R/L lateral pinch strength by 4 or more lbs to enable pt to independently turn on electric toothbrush and twist open toothpaste. Baseline: R 20, L 19; caregiver assist to  turn on toothbrush and open toothpaste Goal status: INITIAL  3.  Pt will increase [place] in bilat hands to manage buttons and zippers on clothing 75% of the time.  Baseline: caregivers assist with clothing fasteners at least 50% of the time  Goal status: INITIAL  4.  Pt will scoop food from plate/bowl with min vc using adapted utensils and adapted plates/bowls as needed.  Baseline: caregiver constantly turns plate to compensate for pt pushing food to 1 side. Goal status: INITIAL  5.  Pt will shave with electric razor with min A for thoroughness. Baseline: pt struggles to grade sufficient pressure to thoroughly shave face, requiring mod A. Goal status: INITIAL  ASSESSMENT:  CLINICAL IMPRESSION:  Pt. Required increased time, and cues for accessing each App on the Iphone. Pt. required increased time, verbal and visual cues for formulating a text message to his wife. Pt. presented with difficulty efficiently lifting his 2nd digit to type each letter individually. Pt.'s finger appeared to drag across the keyboard screen resulting in him often typing the letters around the desired letter. Pt. Continues work on efficiently navigating, and accessing his Iphone.   PERFORMANCE DEFICITS: in functional skills including ADLs, IADLs, coordination, dexterity, proprioception, strength, pain, Fine motor control, Gross motor control, mobility, balance, body mechanics, endurance, decreased knowledge of use of DME, and UE functional use, cognitive skills including attention, learn, memory, orientation, problem solving, safety awareness, thought, and understand. IMPAIRMENTS: are limiting patient from ADLs, IADLs, rest and sleep, leisure, and social participation.   CO-MORBIDITIES: has co-morbidities such as dementia  that affects occupational performance. Patient will benefit from skilled OT to address above impairments and improve overall function.  MODIFICATION OR ASSISTANCE TO COMPLETE EVALUATION: Min-Moderate  modification of tasks or assist with assess necessary to complete an evaluation.  OT OCCUPATIONAL PROFILE AND HISTORY: Problem focused assessment: Including review of records relating to presenting problem.  CLINICAL DECISION MAKING: Moderate - several treatment options, min-mod task modification necessary  REHAB POTENTIAL: Good  EVALUATION COMPLEXITY: Moderate    PLAN:  OT FREQUENCY: 2x/week  OT DURATION: 12 weeks  PLANNED INTERVENTIONS: self care/ADL training, therapeutic exercise, therapeutic activity, neuromuscular re-education, manual therapy, balance training, functional mobility training, moist heat, cryotherapy, patient/family education, cognitive remediation/compensation, energy conservation, coping strategies training, and DME and/or AE instructions  RECOMMENDED OTHER SERVICES: N/A  CONSULTED AND AGREED WITH PLAN OF CARE: Patient and family member/caregiver  PLAN FOR NEXT SESSION: see eval  Harrel Carina, MS, OTR/L

## 2022-03-09 NOTE — Patient Instructions (Signed)
Monitor blood pressure at home   Medication Instructions:  No changes  If you need a refill on your cardiac medications before your next appointment, please call your pharmacy.   Lab work: No new labs needed  Testing/Procedures: No new testing needed  Follow-Up: At [facility], you and your health needs are our priority.  As part of our continuing mission to provide you with exceptional heart care, we have created designated Provider Care Teams.  These Care Teams include your primary Cardiologist (physician) and Advanced Practice Providers (APPs -  Physician Assistants and Nurse Practitioners) who all work together to provide you with the care you need, when you need it.  You will need a follow up appointment in 12 months  Providers on your designated Care Team:   Murray Hodgkins, NP Christell Faith, PA-C Cadence Kathlen Mody, [place]  COVID-19 Vaccine Information can be found at: [email or web address] For questions related to vaccine distribution or appointments, please email [email or web address] or call [phone].

## 2022-03-14 ENCOUNTER — Ambulatory Visit: Payer: Medicare PPO | Admitting: Physical Therapy

## 2022-03-14 ENCOUNTER — Ambulatory Visit: Payer: Medicare PPO

## 2022-03-14 DIAGNOSIS — R278 Other lack of coordination: Secondary | ICD-10-CM | POA: Diagnosis not present

## 2022-03-14 DIAGNOSIS — R2681 Unsteadiness on feet: Secondary | ICD-10-CM

## 2022-03-14 DIAGNOSIS — R262 Difficulty in walking, not elsewhere classified: Secondary | ICD-10-CM | POA: Diagnosis not present

## 2022-03-14 DIAGNOSIS — M6281 Muscle weakness (generalized): Secondary | ICD-10-CM

## 2022-03-14 DIAGNOSIS — G20A1 Parkinson's disease without dyskinesia, without mention of fluctuations: Secondary | ICD-10-CM

## 2022-03-14 DIAGNOSIS — R269 Unspecified abnormalities of gait and mobility: Secondary | ICD-10-CM | POA: Diagnosis not present

## 2022-03-14 DIAGNOSIS — R2689 Other abnormalities of gait and mobility: Secondary | ICD-10-CM | POA: Diagnosis not present

## 2022-03-14 DIAGNOSIS — G20B1 Parkinson's disease with dyskinesia, without mention of fluctuations: Secondary | ICD-10-CM | POA: Diagnosis not present

## 2022-03-14 DIAGNOSIS — R296 Repeated falls: Secondary | ICD-10-CM | POA: Diagnosis not present

## 2022-03-14 NOTE — Therapy (Signed)
OUTPATIENT OCCUPATIONAL THERAPY NEURO TREATMENT  Patient Name: [name] MRN: [id number] DOB:[birth year], 71 y.o., male Today's Date: 03/14/2022  PCP: Dr. Elsie Stain REFERRING PROVIDER: Dr. Elsie Stain   OT End of Session - 03/14/22 1120     Visit Number 4    Number of Visits 24    Date for OT Re-Evaluation 05/23/22    OT Start Time 3976    OT Stop Time 1230    OT Time Calculation (min) 45 min    Equipment Utilized During Treatment rollator    Activity Tolerance Patient tolerated treatment well    Behavior During Therapy WFL for tasks assessed/performed               Past Medical History:  Diagnosis Date   Arthritis    Bradycardia    Cancer ([place]) 2013   skin cancer   Depression    ptsd   Dysrhythmia    chronic slow heart rate   GERD (gastroesophageal reflux disease)    Headache(784.0)    tension headaches non recent   History of chicken pox    History of kidney stones    passed   Hypertension    treated with HCTZ   Pacemaker    Parkinson's disease    dx'ed 15 years ago   PTSD (post-traumatic stress disorder)    Shortness of breath dyspnea    Sleep apnea    doesn't use C-pap   Varicose veins    Past Surgical History:  Procedure Laterality Date   CHOLECYSTECTOMY N/A 10/22/2014   Procedure: LAPAROSCOPIC CHOLECYSTECTOMY WITH INTRAOPERATIVE CHOLANGIOGRAM;  Surgeon: Dia Crawford III, MD;  Location: [facility] ORS;  Service: General;  Laterality: N/A;   cyst removed      from lip as a child   INTRAMEDULLARY (IM) NAIL INTERTROCHANTERIC N/A 02/18/2019   Procedure: INTRAMEDULLARY (IM) NAIL INTERTROCHANTRIC, RIGHT,;  Surgeon: Thornton Park, MD;  Location: [facility];  Service: Orthopedics;  Laterality: N/A;   LUMBAR LAMINECTOMY/DECOMPRESSION MICRODISCECTOMY Bilateral 12/14/2012   Procedure: Bilateral lumbar three-four, four-five decompressive laminotomy/foraminotomy;  Surgeon: Charlie Pitter, MD;  Location: [place] [facility];  Service: Neurosurgery;  Laterality: Bilateral;    PULSE GENERATOR IMPLANT Bilateral 12/13/2013   Procedure: Bilateral implantable pulse generator placement;  Surgeon: Erline Levine, MD;  Location: [place] [facility];  Service: Neurosurgery;  Laterality: Bilateral;  Bilateral implantable pulse generator placement   skin cancer removed     from ears,   12 lft arm  rt leg 15   SUBTHALAMIC STIMULATOR BATTERY REPLACEMENT Bilateral 07/14/2017   Procedure: BILATERAL IMPLANTED PULSE GENERATOR CHANGE FOR DEEP BRAIN STIMULATOR;  Surgeon: Erline Levine, MD;  Location: [facility];  Service: Neurosurgery;  Laterality: Bilateral;   SUBTHALAMIC STIMULATOR INSERTION Bilateral 12/06/2013   Procedure: SUBTHALAMIC STIMULATOR INSERTION;  Surgeon: Erline Levine, MD;  Location: [place] [facility];  Service: Neurosurgery;  Laterality: Bilateral;  Bilateral deep brain stimulator placement   Patient Active Problem List   Diagnosis Date Noted   Hematuria 10/10/2021   Sinus drainage 12/09/2020   Medicare annual wellness visit, subsequent 11/18/2020   Pacemaker    Dementia associated with Parkinson's disease ([place]) 06/17/2020   Vertigo 02/19/2020   Urine abnormality 02/19/2020   Dysphagia 02/23/2019   Fall at home 10/18/2017   REM behavioral disorder 11/02/2016   Radicular pain in right arm 10/09/2016   GERD (gastroesophageal reflux disease) 07/31/2016   Colon cancer screening 07/23/2015   Gout 02/18/2015   Depression 01/06/2015   Skin lesion 10/02/2014   Dupuytren's contracture  08/20/2014   Cough 07/21/2014   Lumbar stenosis with neurogenic claudication 06/13/2014   S/P deep brain stimulator placement 05/08/2014   Advance care planning 01/19/2014   Parkinson's disease 12/13/2013   PTSD (post-traumatic stress disorder) 06/13/2013   Erectile dysfunction 06/13/2013   HLD (hyperlipidemia) 06/13/2013   Essential hypertension 06/03/2013   Bradycardia by electrocardiogram 06/03/2013   Obstructive sleep apnea 03/12/2013    ONSET DATE: 2015  REFERRING DIAG: Parkinson's  Disease  THERAPY DIAG:  Muscle weakness (generalized)  Other lack of coordination  Parkinson's disease without dyskinesia or fluctuating manifestations  Rationale for Evaluation and Treatment: Rehabilitation  SUBJECTIVE:   SUBJECTIVE STATEMENT: Pt reports he had a good holiday with family. Pt accompanied by:  caregiver [name])  PERTINENT HISTORY: Parkinson's Disease, dementia, DBS, multiple falls  PRECAUTIONS: Fall  WEIGHT BEARING RESTRICTIONS: No  PAIN:  Are you having pain? Yes: NPRS scale: 3/10 Pain location: hips and low back, R knee Pain description: sharp, tight Aggravating factors: if pt crosses feet too long/awkward positioning Relieving factors: voltaren gel   FALLS: Has patient fallen in last 6 months? Yes. Number of falls : spouse estimates 20  LIVING ENVIRONMENT: Lives with: lives with their spouse Lives in: house, 1 level Stairs: No Has following equipment at home: [place] entry, handicapped accessible bathroom  PLOF: Needs assistance with ADLs  PATIENT GOALS: increase bilat hand coordination for ADLs  OBJECTIVE:   HAND DOMINANCE: Right  ADLs: Overall ADLs: caregiver assist for all ADLs; pt currently as 16 hrs of caregiver assist per week through the [place].  Pt recently was assigned 100% disability by the [place], and spouse reports caregiver hours will increase to 46 per week, but uncertain when Transfers/ambulation related to ADLs: supv; pt uses upright 4 wheeled walker Eating: caregivers help to cut food Grooming: mod A to shave with an electric razor UB Dressing: min A, difficulty with buttons LB Dressing: mod A, difficulty with zippers and buttons Toileting: occasional accidents with bowel and bladder, but less than weekly and only during the night; distant supv for toileting during the day Bathing: min A for bathing, mod A for drying off Tub Shower transfers: min guard for shower transfers with grab bar and shower chair Equipment:  fully handicapped  shower and toilet  IADLs: Shopping: dep on caregiver Light housekeeping: dep Meal Prep: dep Community mobility: short distance community amb with supv and 4 wheeled walker Medication management: dep Financial management: dep Handwriting:  printed first/last name 50% legible, cursive first/last name illegible  MOBILITY STATUS: Hx of falls  POSTURE COMMENTS:  rounded shoulders, forward head, increased thoracic kyphosis, and flexed trunk   FUNCTIONAL OUTCOME MEASURES: FOTO: 40.2 (completed with PT); predicted 48  UPPER EXTREMITY ROM:   Pt demos BUE shoulder ROM WFL for self care  UPPER EXTREMITY MMT:     MMT Right eval Left eval  Shoulder flexion 5 5  Shoulder abduction 5 5  Shoulder adduction    Shoulder extension    Shoulder internal rotation    Shoulder external rotation    Middle trapezius    Lower trapezius    Elbow flexion 5 5  Elbow extension 5 5  Wrist flexion 5 5  Wrist extension 5 5  Wrist ulnar deviation    Wrist radial deviation    Wrist pronation    Wrist supination    (Blank rows = not tested)  HAND FUNCTION: Grip strength: Right: 61 lbs; Left: 61 lbs, Lateral pinch: Right: 20 lbs, Left: 19 lbs, and 3  point pinch: Right: 11 lbs, Left: 15 lbs  COORDINATION: Finger Nose Finger test: increased time, L side touched lower than tip, corrected with min vc 9 Hole Peg test: Right: 1 min 18 sec; Left: 41 sec  SENSATION: Proprioception: Impaired   EDEMA: none  COGNITION: Overall cognitive status: History of cognitive impairments - at baseline; Parkinson's with dementia  VISION: Wears prism glasses; hx of visual hallucinations  PRAXIS: Impaired: Motor planning; bradykinesia, R>L  TODAY'S TREATMENT:                                                                                                                              DATE:  Therapeutic Exercise: Facilitated pinch strengthening with use of therapy resistant clothespins to target lateral and 3  point pinch of R/L hands.  Pt able to manage all colors, clipping pins onto vertical dowel, requiring min-mod visual and vc to formulate correct lateral pinch pattern.  Self Care: Practiced opening lens wipe packs and cleaning each pair of pt's glasses.  Pt able to open pack with set up and clean glasses with min vc for thoroughness. Practiced typing skills for laptop use.  Reviewed hand positioning for optimal "hunt and peck" technique, with min tactile cues to tuck digits 3-5 in each hand to reduce these digits hitting computer keys unintentionally.  Pt struggled to maintain this position on the R hand which made R thumb tend to slide over touch pad at bottom of keyboard which interrupted fluid typing.  OT made recommendation for caregivers to turn off touch pad on pt's computer at home (pt uses a mouse instead) to deactivate it which would allow pt to have a more relaxed hand when hunting and pecking on the keyboard.  Made additional recommendation for written reminder next to keyboard for pt's username and password d/t reported forgetfulness with this.  Caregiver plans to work on these recommendations.   PATIENT EDUCATION: Education details: compensatory strategies for computer use Person educated: Patient, Spouse, and Caregiver , [name] Education method: Explanation Education comprehension: verbalized understanding  HOME EXERCISE PROGRAM: To be established in the next 1-2 sessions   GOALS: Goals reviewed with patient? Yes  SHORT TERM GOALS: Target date: 04/11/22  1. Pt will complete HEP for increasing [place] and strength in bilat hands with direct supv at least 4 of 7 days per week. Baseline: not yet initiated Goal status: INITIAL  2. Pt will clean his glasses with min A and mod vc, including opening cleaning packet.   Baseline: max A  Goal status: initial     LONG TERM GOALS: Target date: 05/23/22  Pt will increase FOTO score by 5 or more points to indicate increase in self/caregiver  perceived functional performance with daily tasks.  Baseline: 40.2 (predicted 48) Goal status: INITIAL  2.  Pt will increase R/L lateral pinch strength by 4 or more lbs to enable pt to independently turn on electric toothbrush and twist open toothpaste. Baseline: R 20,  L 19; caregiver assist to turn on toothbrush and open toothpaste Goal status: INITIAL  3.  Pt will increase [place] in bilat hands to manage buttons and zippers on clothing 75% of the time.  Baseline: caregivers assist with clothing fasteners at least 50% of the time Goal status: INITIAL  4.  Pt will scoop food from plate/bowl with min vc using adapted utensils and adapted plates/bowls as needed.  Baseline: caregiver constantly turns plate to compensate for pt pushing food to 1 side. Goal status: INITIAL  5.  Pt will shave with electric razor with min A for thoroughness. Baseline: pt struggles to grade sufficient pressure to thoroughly shave face, requiring mod A. Goal status: INITIAL  ASSESSMENT:  CLINICAL IMPRESSION: Good tolerance to therapeutic exercises this date.  Practiced opening lens wipe packs and cleaning each pair of pt's glasses.  Pt able to open pack with set up and clean glasses with min vc for thoroughness.  Practiced typing skills for laptop use, requiring initial min tactile cues for using only index fingers to hunt and peck as to prevent other digits hitting other keys unintentionally or having thumbs hit touch pad.  Pt struggled to maintain the position of just using IF, so OT recommended that caregiver turn off touch pad on pt's computer at home so that hands can be more relaxed without disrupting the typing flow by accidentally moving the cursor from the touch pad.  Also made recommendation for visual reminders for user names and passwords to compensate for memory deficits.  Caregiver plans to implement recommendations.  Additional skilled OT will benefit pt to maximize hand strength and coordination for daily  tasks, and provide strategies to maximize indep with ADLs and leisure activities to increase QOL.  PERFORMANCE DEFICITS: in functional skills including ADLs, IADLs, coordination, dexterity, proprioception, strength, pain, Fine motor control, Gross motor control, mobility, balance, body mechanics, endurance, decreased knowledge of use of DME, and UE functional use, cognitive skills including attention, learn, memory, orientation, problem solving, safety awareness, thought, and understand. IMPAIRMENTS: are limiting patient from ADLs, IADLs, rest and sleep, leisure, and social participation.   CO-MORBIDITIES: has co-morbidities such as dementia  that affects occupational performance. Patient will benefit from skilled OT to address above impairments and improve overall function.  MODIFICATION OR ASSISTANCE TO COMPLETE EVALUATION: Min-Moderate modification of tasks or assist with assess necessary to complete an evaluation.  OT OCCUPATIONAL PROFILE AND HISTORY: Problem focused assessment: Including review of records relating to presenting problem.  CLINICAL DECISION MAKING: Moderate - several treatment options, min-mod task modification necessary  REHAB POTENTIAL: Good  EVALUATION COMPLEXITY: Moderate    PLAN:  OT FREQUENCY: 2x/week  OT DURATION: 12 weeks  PLANNED INTERVENTIONS: self care/ADL training, therapeutic exercise, therapeutic activity, neuromuscular re-education, manual therapy, balance training, functional mobility training, moist heat, cryotherapy, patient/family education, cognitive remediation/compensation, energy conservation, coping strategies training, and DME and/or AE instructions  RECOMMENDED OTHER SERVICES: N/A  CONSULTED AND AGREED WITH PLAN OF CARE: Patient and family member/caregiver  PLAN FOR NEXT SESSION: see eval  Leta Speller, MS, OTR/L  Darleene Cleaver, OT 03/14/2022, 3:49 PM

## 2022-03-14 NOTE — Therapy (Signed)
OUTPATIENT PHYSICAL THERAPY NEURO Treatment   Patient Name: [name] MRN: [id number] DOB:1951-02-10, 71 y.o., male Today's Date: 03/14/2022   PCP: Dr. Elsie Stain REFERRING PROVIDER: Dr. Sharolyn Douglas   PT End of Session - 03/14/22 1215     Visit Number 4    Number of Visits 24    Date for PT Re-Evaluation 05/23/22    Authorization Type [employer] PPO    Authorization Time Period Recert [id number]    Progress Note Due on Visit 10    PT Start Time 1102    PT Stop Time 1145    PT Time Calculation (min) 43 min    Equipment Utilized During Treatment Gait belt    Activity Tolerance Patient tolerated treatment well    Behavior During Therapy WFL for tasks assessed/performed                  Past Medical History:  Diagnosis Date   Arthritis    Bradycardia    Cancer ([place]) 2013   skin cancer   Depression    ptsd   Dysrhythmia    chronic slow heart rate   GERD (gastroesophageal reflux disease)    Headache(784.0)    tension headaches non recent   History of chicken pox    History of kidney stones    passed   Hypertension    treated with HCTZ   Pacemaker    Parkinson's disease    dx'ed 15 years ago   PTSD (post-traumatic stress disorder)    Shortness of breath dyspnea    Sleep apnea    doesn't use C-pap   Varicose veins    Past Surgical History:  Procedure Laterality Date   CHOLECYSTECTOMY N/A 10/22/2014   Procedure: LAPAROSCOPIC CHOLECYSTECTOMY WITH INTRAOPERATIVE CHOLANGIOGRAM;  Surgeon: Dia Crawford III, MD;  Location: [facility];  Service: General;  Laterality: N/A;   cyst removed      from lip as a child   INTRAMEDULLARY (IM) NAIL INTERTROCHANTERIC N/A 02/18/2019   Procedure: INTRAMEDULLARY (IM) NAIL INTERTROCHANTRIC, RIGHT,;  Surgeon: Thornton Park, MD;  Location: [facility];  Service: Orthopedics;  Laterality: N/A;   LUMBAR LAMINECTOMY/DECOMPRESSION MICRODISCECTOMY Bilateral 12/14/2012   Procedure: Bilateral lumbar three-four,  four-five decompressive laminotomy/foraminotomy;  Surgeon: Charlie Pitter, MD;  Location: [place] [facility];  Service: Neurosurgery;  Laterality: Bilateral;   PULSE GENERATOR IMPLANT Bilateral 12/13/2013   Procedure: Bilateral implantable pulse generator placement;  Surgeon: Erline Levine, MD;  Location: [place] [facility];  Service: Neurosurgery;  Laterality: Bilateral;  Bilateral implantable pulse generator placement   skin cancer removed     from ears,   12 lft arm  rt leg 15   SUBTHALAMIC STIMULATOR BATTERY REPLACEMENT Bilateral 07/14/2017   Procedure: BILATERAL IMPLANTED PULSE GENERATOR CHANGE FOR DEEP BRAIN STIMULATOR;  Surgeon: Erline Levine, MD;  Location: [place];  Service: Neurosurgery;  Laterality: Bilateral;   SUBTHALAMIC STIMULATOR INSERTION Bilateral 12/06/2013   Procedure: SUBTHALAMIC STIMULATOR INSERTION;  Surgeon: Erline Levine, MD;  Location: [place] [facility];  Service: Neurosurgery;  Laterality: Bilateral;  Bilateral deep brain stimulator placement   Patient Active Problem List   Diagnosis Date Noted   Hematuria 10/10/2021   Sinus drainage 12/09/2020   Medicare annual wellness visit, subsequent 11/18/2020   Pacemaker    Dementia associated with Parkinson's disease ([place]) 06/17/2020   Vertigo 02/19/2020   Urine abnormality 02/19/2020   Dysphagia 02/23/2019   Fall at home 10/18/2017   REM behavioral disorder 11/02/2016   Radicular pain in right  arm 10/09/2016   GERD (gastroesophageal reflux disease) 07/31/2016   Colon cancer screening 07/23/2015   Gout 02/18/2015   Depression 01/06/2015   Skin lesion 10/02/2014   Dupuytren's contracture 08/20/2014   Cough 07/21/2014   Lumbar stenosis with neurogenic claudication 06/13/2014   S/P deep brain stimulator placement 05/08/2014   Advance care planning 01/19/2014   Parkinson's disease 12/13/2013   PTSD (post-traumatic stress disorder) 06/13/2013   Erectile dysfunction 06/13/2013   HLD (hyperlipidemia) 06/13/2013   Essential hypertension  06/03/2013   Bradycardia by electrocardiogram 06/03/2013   Obstructive sleep apnea 03/12/2013    ONSET DATE: 15 years ago per chart  REFERRING DIAG: Parkinson's disease  THERAPY DIAG:  Muscle weakness (generalized)  Difficulty in walking, not elsewhere classified  Unsteadiness on feet  Rationale for Evaluation and Treatment Rehabilitation  SUBJECTIVE:                                                                                                                                                                                              SUBJECTIVE STATEMENT: Pt and caregiver report no falls or LOB since last visit. Report thanksgiving went well and he enjoyed the food.   Pt accompanied by: significant other  PERTINENT HISTORY:   Date of most recent Admission: 09/25/2021 Date of Discharge: 09/28/2021  Discharge Diagnoses:  Altered mental status Parkinson disease (CMS/HCC) Hallucinations  Per [facility] discharge summary notes PHI : Hospital Course:   71 y.o. male with a history of Parkinson's Disease w/ DBS in place, PD-related dementia, OSA, depression, and seizure disorder who presented for EEG given concern for seizures.  Altered mental status Concern seizure activity Periods of unresponsiveness on the setting of new medication--Nuplazid (pimavanserin) recently started. No events of note on continuous EEG. This was held and he returned to his baseline mental status. Other workup including CT head, CTA h/n without obvious cause for periodic unresponsiveness. Nuplazid was discontinued.   Aspiration, weakness Of note, he had a coughing spell while eating though sounds like was precipitated by sneezing. Did well on RA, seen by SLP without any further needs or concerns.  Parkinson's Disease Parkinson's-related dementia - He follows in the [facility] Disorders center at [facility]. Clinic notes suggest he seems to have worsening of his overall disease trajectory. - Continued home  Memantine and Sinemet      PMH: HTN, bradycardia s/p PPM, PD, DBS, COVID-19 infection, chronic low back pain s/p L4/5 surgery.  PAIN:  Are you having pain? Yes: NPRS scale: 7/10 Pain location: Low back Pain description: ache Aggravating factors: Prolonged standing/sitting, bending over Relieving factors: rest, changing positions  PRECAUTIONS: Fall  WEIGHT BEARING RESTRICTIONS No  FALLS: Has patient fallen in last 6 months? Yes. Number of falls >10  LIVING ENVIRONMENT: Lives with: lives with their spouse Lives in: House/apartment Stairs:  Ramp Has following equipment at home: Single point cane, Environmental consultant - 4 wheeled, Wheelchair (manual), shower chair, bed side commode, Grab bars, and Ramped entry  PLOF: Needs assistance with ADLs, Needs assistance with homemaking, Needs assistance with gait, and Needs assistance with transfers  PATIENT GOALS To walk better and not fall. Improve fall frequency to prevent significant injury   OBJECTIVE:   DIAGNOSTIC FINDINGS:   COGNITION: Overall cognitive status: History of cognitive impairments - at baseline   SENSATION: Light touch: WFL  COORDINATION: Decreased and slow to initiate  EDEMA:  None observed     POSTURE: rounded shoulders, forward head, increased thoracic kyphosis, and flexed trunk      TRANSFERS: Assistive device utilized:  Upright 4WW   Sit to stand:  Min/mod assist- with increased VC for safety Stand to sit:  Min assist with Verbal cues to lock brakes and reach back Chair to chair: Min A     GAIT: Gait pattern: decreased arm swing- Right, decreased arm swing- Left, decreased step length- Right, decreased step length- Left, decreased stance time- Right, decreased stance time- Left, decreased stride length, and shuffling Distance walked: 150 ft. Assistive device utilized:  upright 4WW Level of assistance: Min A Comments: Shuffling gait with slow gait speed  FUNCTIONAL TESTs:  5 times sit to stand: 19.5  sec with UE support with BUE support Timed up and go (TUG): 37.26 sec with upright 4WW 6 minute walk test: 720 feet using upright 4WW 10 meter walk test: 0.67 m/s  PATIENT SURVEYS:  FOTO 50  TODAY'S TREATMENT:  03/14/22  STS from standard armchair to standing with up walker, working on sequencing for safe STS transitions. Good form and ability to follow instructions for safety this date. X 10   Balance obstacel course including STS transfer, airex step on/off, setpping over obstacles ( yoga block, 1/2 foam roller) boxing, and cone weaving. Difficulty with memory of tasks anc cues to limit impulsivity- completed many reps. Completing with up walker.   SLS progression: 1 LE floor 1 on 6 in step. 2 x 45 sec ea LE   PWR! Step 2 x 10 ea LE   Standing cone pickup from ground x 3, cues for stepping closer to object prior to attempting to pick up.   Unless otherwise stated, CGA was provided and gait belt donned in order to ensure pt safety    PATIENT EDUCATION: Education details: PT plan of care; purpose of functional outcomes Person educated: Patient and Spouse Education method: Explanation and Verbal cues Education comprehension: verbalized understanding   HOME EXERCISE PROGRAM: To be reviewed and progressed next 1-2 visits    GOALS: Goals reviewed with patient? Yes  SHORT TERM GOALS: Target date: 01/11/2022  Pt will be independent with HEP in order to improve strength and balance in order to decrease fall risk and improve function at home and work.  Baseline: Patient reports not performing much in the way of any HEP except walking. Goal status: INITIAL  LONG TERM GOALS: Target date: 02/22/2022  Pt will decrease 5TSTS by at least 5 seconds in order to demonstrate clinically significant improvement in LE strength. Baseline: 02/28/2022: 19.5 sec with UE support   Goal status: INITIAL  2.  Pt will improve FOTO to target score of 48 to display perceived improvements in ability  to complete ADL's.  Baseline: 02/28/22=40 Goal status: INITIAL  3.  Pt will decrease TUG to < or = to 25 seconds/decrease in order to demonstrate decreased fall risk. Baseline: 02/28/22= 37.26 sec using upright 4WW Goal status: INITIAL  4.  Pt will increase 10MWT by at least 0.13 m/s in order to demonstrate clinically significant improvement in community ambulation.   Baseline: 02/28/22= 0.67 m/s using upright 4WW Goal status: INITIAL  5.  Patient will increase six minute walk test distance to >1000 for progression to community ambulator and improve gait ability  Baseline: 02/28/22: 720 feet with up walker  Goal status: INITIAL  6.  Patient will experience 1 fall or less per 2 week period for at least 3 weeks in order to improve his frequency of falls and prevent injury from falls  Baseline: 02/28/22: pt falls approximately once every 10 days  Goal status: INITIAL  ASSESSMENT:  CLINICAL IMPRESSION: Patient presents with good motivation for completion of physical therapy activities.  Continued with balance obstacle course to focus on functional tasks that have caused falls like STS transitions with up walker. Pt progressing with safety with tasks but pt does require intermittent cues for safety with tasks performed.  Pt will continue to benefit from skilled physical therapy intervention to address impairments, improve QOL, and attain therapy goals.     OBJECTIVE IMPAIRMENTS Abnormal gait, decreased activity tolerance, decreased balance, decreased cognition, decreased coordination, decreased endurance, decreased mobility, difficulty walking, decreased ROM, decreased strength, decreased safety awareness, hypomobility, impaired perceived functional ability, and pain.   ACTIVITY LIMITATIONS carrying, lifting, bending, standing, squatting, stairs, transfers, bed mobility, bathing, toileting, dressing, self feeding, hygiene/grooming, and caring for others  PARTICIPATION LIMITATIONS: meal  prep, cleaning, laundry, medication management, personal finances, driving, shopping, community activity, and yard work  PERSONAL FACTORS Time since onset of injury/illness/exacerbation are also affecting patient's functional outcome.   REHAB POTENTIAL: Fair    CLINICAL DECISION MAKING: Evolving/moderate complexity  EVALUATION COMPLEXITY: Moderate  PLAN: PT FREQUENCY: 2x/week  PT DURATION: 12 weeks  PLANNED INTERVENTIONS: Therapeutic exercises, Therapeutic activity, Neuromuscular re-education, Balance training, Gait training, Patient/Family education, Self Care, Joint mobilization, Joint manipulation, Stair training, Vestibular training, Canalith repositioning, DME instructions, Dry Needling, Electrical stimulation, Spinal manipulation, Spinal mobilization, Cryotherapy, Moist heat, Manual therapy, and Re-evaluation  PLAN FOR NEXT SESSION: Begin New HEP, work with pt regarding safety at home.    Particia Lather, PT 03/14/2022, 12:15 PM

## 2022-03-16 ENCOUNTER — Ambulatory Visit: Payer: Medicare PPO | Admitting: Physical Therapy

## 2022-03-16 ENCOUNTER — Ambulatory Visit: Payer: Medicare PPO

## 2022-03-16 DIAGNOSIS — G20A1 Parkinson's disease without dyskinesia, without mention of fluctuations: Secondary | ICD-10-CM

## 2022-03-16 DIAGNOSIS — R2689 Other abnormalities of gait and mobility: Secondary | ICD-10-CM

## 2022-03-16 DIAGNOSIS — M6281 Muscle weakness (generalized): Secondary | ICD-10-CM | POA: Diagnosis not present

## 2022-03-16 DIAGNOSIS — R278 Other lack of coordination: Secondary | ICD-10-CM

## 2022-03-16 DIAGNOSIS — R269 Unspecified abnormalities of gait and mobility: Secondary | ICD-10-CM

## 2022-03-16 DIAGNOSIS — R262 Difficulty in walking, not elsewhere classified: Secondary | ICD-10-CM | POA: Diagnosis not present

## 2022-03-16 DIAGNOSIS — R2681 Unsteadiness on feet: Secondary | ICD-10-CM | POA: Diagnosis not present

## 2022-03-16 DIAGNOSIS — R296 Repeated falls: Secondary | ICD-10-CM | POA: Diagnosis not present

## 2022-03-16 DIAGNOSIS — G20B1 Parkinson's disease with dyskinesia, without mention of fluctuations: Secondary | ICD-10-CM | POA: Diagnosis not present

## 2022-03-16 NOTE — Therapy (Signed)
OUTPATIENT PHYSICAL THERAPY NEURO Treatment   Patient Name: [name] MRN: [id number] DOB:1950-07-07, 71 y.o., male Today's Date: 03/16/2022   PCP: Dr. Elsie Stain REFERRING PROVIDER: Dr. Sharolyn Douglas   PT End of Session - 03/16/22 1237     Visit Number 5    Number of Visits 24    Date for PT Re-Evaluation 05/23/22    Authorization Type [employer] PPO    Authorization Time Period Recert [id number]    Progress Note Due on Visit 10    PT Start Time 1102    PT Stop Time 1145    PT Time Calculation (min) 43 min    Equipment Utilized During Treatment Gait belt    Activity Tolerance Patient tolerated treatment well    Behavior During Therapy WFL for tasks assessed/performed                  Past Medical History:  Diagnosis Date   Arthritis    Bradycardia    Cancer ([place]) 2013   skin cancer   Depression    ptsd   Dysrhythmia    chronic slow heart rate   GERD (gastroesophageal reflux disease)    Headache(784.0)    tension headaches non recent   History of chicken pox    History of kidney stones    passed   Hypertension    treated with HCTZ   Pacemaker    Parkinson's disease    dx'ed 15 years ago   PTSD (post-traumatic stress disorder)    Shortness of breath dyspnea    Sleep apnea    doesn't use C-pap   Varicose veins    Past Surgical History:  Procedure Laterality Date   CHOLECYSTECTOMY N/A 10/22/2014   Procedure: LAPAROSCOPIC CHOLECYSTECTOMY WITH INTRAOPERATIVE CHOLANGIOGRAM;  Surgeon: Dia Crawford III, MD;  Location: [facility];  Service: General;  Laterality: N/A;   cyst removed      from lip as a child   INTRAMEDULLARY (IM) NAIL INTERTROCHANTERIC N/A 02/18/2019   Procedure: INTRAMEDULLARY (IM) NAIL INTERTROCHANTRIC, RIGHT,;  Surgeon: Thornton Park, MD;  Location: [facility];  Service: Orthopedics;  Laterality: N/A;   LUMBAR LAMINECTOMY/DECOMPRESSION MICRODISCECTOMY Bilateral 12/14/2012   Procedure: Bilateral lumbar three-four,  four-five decompressive laminotomy/foraminotomy;  Surgeon: Charlie Pitter, MD;  Location: [place] [facility];  Service: Neurosurgery;  Laterality: Bilateral;   PULSE GENERATOR IMPLANT Bilateral 12/13/2013   Procedure: Bilateral implantable pulse generator placement;  Surgeon: Erline Levine, MD;  Location: [place] [facility];  Service: Neurosurgery;  Laterality: Bilateral;  Bilateral implantable pulse generator placement   skin cancer removed     from ears,   12 lft arm  rt leg 15   SUBTHALAMIC STIMULATOR BATTERY REPLACEMENT Bilateral 07/14/2017   Procedure: BILATERAL IMPLANTED PULSE GENERATOR CHANGE FOR DEEP BRAIN STIMULATOR;  Surgeon: Erline Levine, MD;  Location: [place];  Service: Neurosurgery;  Laterality: Bilateral;   SUBTHALAMIC STIMULATOR INSERTION Bilateral 12/06/2013   Procedure: SUBTHALAMIC STIMULATOR INSERTION;  Surgeon: Erline Levine, MD;  Location: [place] [facility];  Service: Neurosurgery;  Laterality: Bilateral;  Bilateral deep brain stimulator placement   Patient Active Problem List   Diagnosis Date Noted   Hematuria 10/10/2021   Sinus drainage 12/09/2020   Medicare annual wellness visit, subsequent 11/18/2020   Pacemaker    Dementia associated with Parkinson's disease ([place]) 06/17/2020   Vertigo 02/19/2020   Urine abnormality 02/19/2020   Dysphagia 02/23/2019   Fall at home 10/18/2017   REM behavioral disorder 11/02/2016   Radicular pain in right  arm 10/09/2016   GERD (gastroesophageal reflux disease) 07/31/2016   Colon cancer screening 07/23/2015   Gout 02/18/2015   Depression 01/06/2015   Skin lesion 10/02/2014   Dupuytren's contracture 08/20/2014   Cough 07/21/2014   Lumbar stenosis with neurogenic claudication 06/13/2014   S/P deep brain stimulator placement 05/08/2014   Advance care planning 01/19/2014   Parkinson's disease 12/13/2013   PTSD (post-traumatic stress disorder) 06/13/2013   Erectile dysfunction 06/13/2013   HLD (hyperlipidemia) 06/13/2013   Essential hypertension  06/03/2013   Bradycardia by electrocardiogram 06/03/2013   Obstructive sleep apnea 03/12/2013    ONSET DATE: 15 years ago per chart  REFERRING DIAG: Parkinson's disease  THERAPY DIAG:  Difficulty in walking, not elsewhere classified  Unsteadiness on feet  Repeated falls  Abnormality of gait and mobility  Other abnormalities of gait and mobility  Rationale for Evaluation and Treatment Rehabilitation  SUBJECTIVE:                                                                                                                                                                                              SUBJECTIVE STATEMENT: Pt and caregiver report no falls or LOB since last visit. They present without up walker but is reassured we have one here for task practice this session.   Pt accompanied by: significant other  PERTINENT HISTORY:   Date of most recent Admission: 09/25/2021 Date of Discharge: 09/28/2021  Discharge Diagnoses:  Altered mental status Parkinson disease (CMS/HCC) Hallucinations  Per [facility] discharge summary notes PHI : Hospital Course:   71 y.o. male with a history of Parkinson's Disease w/ DBS in place, PD-related dementia, OSA, depression, and seizure disorder who presented for EEG given concern for seizures.  Altered mental status Concern seizure activity Periods of unresponsiveness on the setting of new medication--Nuplazid (pimavanserin) recently started. No events of note on continuous EEG. This was held and he returned to his baseline mental status. Other workup including CT head, CTA h/n without obvious cause for periodic unresponsiveness. Nuplazid was discontinued.   Aspiration, weakness Of note, he had a coughing spell while eating though sounds like was precipitated by sneezing. Did well on RA, seen by SLP without any further needs or concerns.  Parkinson's Disease Parkinson's-related dementia - He follows in the [facility] center at  [facility]. Clinic notes suggest he seems to have worsening of his overall disease trajectory. - Continued home Memantine and Sinemet      PMH: HTN, bradycardia s/p PPM, PD, DBS, COVID-19 infection, chronic low back pain s/p L4/5 surgery.  PAIN:  Are you having pain? Yes: NPRS scale: 7/10 Pain  location: Low back Pain description: ache Aggravating factors: Prolonged standing/sitting, bending over Relieving factors: rest, changing positions  PRECAUTIONS: Fall  WEIGHT BEARING RESTRICTIONS No  FALLS: Has patient fallen in last 6 months? Yes. Number of falls >10  LIVING ENVIRONMENT: Lives with: lives with their spouse Lives in: House/apartment Stairs:  Ramp Has following equipment at home: Single point cane, Environmental consultant - 4 wheeled, Wheelchair (manual), shower chair, bed side commode, Grab bars, and Ramped entry  PLOF: Needs assistance with ADLs, Needs assistance with homemaking, Needs assistance with gait, and Needs assistance with transfers  PATIENT GOALS To walk better and not fall. Improve fall frequency to prevent significant injury   OBJECTIVE:   DIAGNOSTIC FINDINGS:   COGNITION: Overall cognitive status: History of cognitive impairments - at baseline   SENSATION: Light touch: WFL  COORDINATION: Decreased and slow to initiate  EDEMA:  None observed     POSTURE: rounded shoulders, forward head, increased thoracic kyphosis, and flexed trunk      TRANSFERS: Assistive device utilized:  Upright 4WW   Sit to stand:  Min/mod assist- with increased VC for safety Stand to sit:  Min assist with Verbal cues to lock brakes and reach back Chair to chair: Min A     GAIT: Gait pattern: decreased arm swing- Right, decreased arm swing- Left, decreased step length- Right, decreased step length- Left, decreased stance time- Right, decreased stance time- Left, decreased stride length, and shuffling Distance walked: 150 ft. Assistive device utilized:  upright 4WW Level of  assistance: Min A Comments: Shuffling gait with slow gait speed  FUNCTIONAL TESTs:  5 times sit to stand: 19.5 sec with UE support with BUE support Timed up and go (TUG): 37.26 sec with upright 4WW 6 minute walk test: 720 feet using upright 4WW 10 meter walk test: 0.67 m/s  PATIENT SURVEYS:  FOTO 50  TODAY'S TREATMENT:  03/16/22    Balance obstacel course including STS transfer, stepping onto and walking on large red mat, setpping over obstacles ( yoga block, basketball shot). Difficulty with memory of tasks anc cues to limit impulsivity- completed many reps. Completing with up walker. Working on Biomedical scientist and planning as well as safety. Pt required intermittent cues for safety with STS portion.   SLS progression: 1 LE floor 1 on 6 in step. 2 x 45 sec ea LE   PWR! Step  x 10 ea LE   LAQ 5# AW x 10 ea with 5 sec hold to work on LE endurance in knee extension   Unless otherwise stated, CGA was provided and gait belt donned in order to ensure pt safety    PATIENT EDUCATION: Education details: PT plan of care; purpose of functional outcomes Person educated: Patient and Spouse Education method: Explanation and Verbal cues Education comprehension: verbalized understanding   HOME EXERCISE PROGRAM: To be reviewed and progressed next 1-2 visits    GOALS: Goals reviewed with patient? Yes  SHORT TERM GOALS: Target date: 01/11/2022  Pt will be independent with HEP in order to improve strength and balance in order to decrease fall risk and improve function at home and work.  Baseline: Patient reports not performing much in the way of any HEP except walking. Goal status: INITIAL  LONG TERM GOALS: Target date: 02/22/2022  Pt will decrease 5TSTS by at least 5 seconds in order to demonstrate clinically significant improvement in LE strength. Baseline: 02/28/2022: 19.5 sec with UE support   Goal status: INITIAL  2.  Pt will improve FOTO to  target score of 48 to display  perceived improvements in ability to complete ADL's.  Baseline: 02/28/22=40 Goal status: INITIAL  3.  Pt will decrease TUG to < or = to 25 seconds/decrease in order to demonstrate decreased fall risk. Baseline: 02/28/22= 37.26 sec using upright 4WW Goal status: INITIAL  4.  Pt will increase 10MWT by at least 0.13 m/s in order to demonstrate clinically significant improvement in community ambulation.   Baseline: 02/28/22= 0.67 m/s using upright 4WW Goal status: INITIAL  5.  Patient will increase six minute walk test distance to >1000 for progression to community ambulator and improve gait ability  Baseline: 02/28/22: 720 feet with up walker  Goal status: INITIAL  6.  Patient will experience 1 fall or less per 2 week period for at least 3 weeks in order to improve his frequency of falls and prevent injury from falls  Baseline: 02/28/22: pt falls approximately once every 10 days  Goal status: INITIAL  ASSESSMENT:  CLINICAL IMPRESSION: Patient presents with good motivation for completion of physical therapy activities.  Continued with balance obstacle course to focus on functional tasks that have caused falls like STS transitions with up walker. Pt progressing with safety with tasks but pt does require intermittent cues for safety with tasks performed.  Pt has difficulty with maintaining knee extension without UE support. Pt will continue to benefit from skilled physical therapy intervention to address impairments, improve QOL, and attain therapy goals.     OBJECTIVE IMPAIRMENTS Abnormal gait, decreased activity tolerance, decreased balance, decreased cognition, decreased coordination, decreased endurance, decreased mobility, difficulty walking, decreased ROM, decreased strength, decreased safety awareness, hypomobility, impaired perceived functional ability, and pain.   ACTIVITY LIMITATIONS carrying, lifting, bending, standing, squatting, stairs, transfers, bed mobility, bathing, toileting,  dressing, self feeding, hygiene/grooming, and caring for others  PARTICIPATION LIMITATIONS: meal prep, cleaning, laundry, medication management, personal finances, driving, shopping, community activity, and yard work  PERSONAL FACTORS Time since onset of injury/illness/exacerbation are also affecting patient's functional outcome.   REHAB POTENTIAL: Fair    CLINICAL DECISION MAKING: Evolving/moderate complexity  EVALUATION COMPLEXITY: Moderate  PLAN: PT FREQUENCY: 2x/week  PT DURATION: 12 weeks  PLANNED INTERVENTIONS: Therapeutic exercises, Therapeutic activity, Neuromuscular re-education, Balance training, Gait training, Patient/Family education, Self Care, Joint mobilization, Joint manipulation, Stair training, Vestibular training, Canalith repositioning, DME instructions, Dry Needling, Electrical stimulation, Spinal manipulation, Spinal mobilization, Cryotherapy, Moist heat, Manual therapy, and Re-evaluation  PLAN FOR NEXT SESSION: Begin New HEP, work with pt regarding safety at home.    Particia Lather, PT 03/16/2022, 12:40 PM

## 2022-03-17 NOTE — Therapy (Addendum)
OUTPATIENT OCCUPATIONAL THERAPY NEURO TREATMENT  Patient Name: [name] MRN: [id number] DOB:16-Sep-1950, 71 y.o., male Today's Date: 03/14/2022  PCP: Dr. Elsie Stain REFERRING PROVIDER: Dr. Elsie Stain   OT End of Session - 03/14/22 1120     Visit Number 4    Number of Visits 24    Date for OT Re-Evaluation 05/23/22    OT Start Time 4782    OT Stop Time 1230    OT Time Calculation (min) 45 min    Equipment Utilized During Treatment rollator    Activity Tolerance Patient tolerated treatment well    Behavior During Therapy WFL for tasks assessed/performed               Past Medical History:  Diagnosis Date   Arthritis    Bradycardia    Cancer ([place]) 2013   skin cancer   Depression    ptsd   Dysrhythmia    chronic slow heart rate   GERD (gastroesophageal reflux disease)    Headache(784.0)    tension headaches non recent   History of chicken pox    History of kidney stones    passed   Hypertension    treated with HCTZ   Pacemaker    Parkinson's disease    dx'ed 15 years ago   PTSD (post-traumatic stress disorder)    Shortness of breath dyspnea    Sleep apnea    doesn't use C-pap   Varicose veins    Past Surgical History:  Procedure Laterality Date   CHOLECYSTECTOMY N/A 10/22/2014   Procedure: LAPAROSCOPIC CHOLECYSTECTOMY WITH INTRAOPERATIVE CHOLANGIOGRAM;  Surgeon: Dia Crawford III, MD;  Location: [facility] ORS;  Service: General;  Laterality: N/A;   cyst removed      from lip as a child   INTRAMEDULLARY (IM) NAIL INTERTROCHANTERIC N/A 02/18/2019   Procedure: INTRAMEDULLARY (IM) NAIL INTERTROCHANTRIC, RIGHT,;  Surgeon: Thornton Park, MD;  Location: [facility] OR[facility];  Service: Orthopedics;  Laterality: N/A;   LUMBAR LAMINECTOMY/DECOMPRESSION MICRODISCECTOMY Bilateral 12/14/2012   Procedure: Bilateral lumbar three-four, four-five decompressive laminotomy/foraminotomy;  Surgeon: Charlie Pitter, MD;  Location: [place] [facility];  Service: Neurosurgery;  Laterality: Bilateral;    PULSE GENERATOR IMPLANT Bilateral 12/13/2013   Procedure: Bilateral implantable pulse generator placement;  Surgeon: Erline Levine, MD;  Location: [place] [facility];  Service: Neurosurgery;  Laterality: Bilateral;  Bilateral implantable pulse generator placement   skin cancer removed     from ears,   12 lft arm  rt leg 15   SUBTHALAMIC STIMULATOR BATTERY REPLACEMENT Bilateral 07/14/2017   Procedure: BILATERAL IMPLANTED PULSE GENERATOR CHANGE FOR DEEP BRAIN STIMULATOR;  Surgeon: Erline Levine, MD;  Location: [place];  Service: Neurosurgery;  Laterality: Bilateral;   SUBTHALAMIC STIMULATOR INSERTION Bilateral 12/06/2013   Procedure: SUBTHALAMIC STIMULATOR INSERTION;  Surgeon: Erline Levine, MD;  Location: [place] [facility];  Service: Neurosurgery;  Laterality: Bilateral;  Bilateral deep brain stimulator placement   Patient Active Problem List   Diagnosis Date Noted   Hematuria 10/10/2021   Sinus drainage 12/09/2020   Medicare annual wellness visit, subsequent 11/18/2020   Pacemaker    Dementia associated with Parkinson's disease ([place]) 06/17/2020   Vertigo 02/19/2020   Urine abnormality 02/19/2020   Dysphagia 02/23/2019   Fall at home 10/18/2017   REM behavioral disorder 11/02/2016   Radicular pain in right arm 10/09/2016   GERD (gastroesophageal reflux disease) 07/31/2016   Colon cancer screening 07/23/2015   Gout 02/18/2015   Depression 01/06/2015   Skin lesion 10/02/2014   Dupuytren's contracture  08/20/2014   Cough 07/21/2014   Lumbar stenosis with neurogenic claudication 06/13/2014   S/P deep brain stimulator placement 05/08/2014   Advance care planning 01/19/2014   Parkinson's disease 12/13/2013   PTSD (post-traumatic stress disorder) 06/13/2013   Erectile dysfunction 06/13/2013   HLD (hyperlipidemia) 06/13/2013   Essential hypertension 06/03/2013   Bradycardia by electrocardiogram 06/03/2013   Obstructive sleep apnea 03/12/2013    ONSET DATE: 2015  REFERRING DIAG: Parkinson's  Disease  THERAPY DIAG:  Muscle weakness (generalized)  Other lack of coordination  Parkinson's disease without dyskinesia or fluctuating manifestations  Rationale for Evaluation and Treatment: Rehabilitation  SUBJECTIVE:   SUBJECTIVE STATEMENT: Pt reports he and his caregiver remembered his laptop today but forget his reading glasses to be able to practice on his laptop.  Pt accompanied by:  caregiver [name])  PERTINENT HISTORY: Parkinson's Disease, dementia, DBS, multiple falls  PRECAUTIONS: Fall  WEIGHT BEARING RESTRICTIONS: No  PAIN:  Are you having pain? Yes: NPRS scale: 3/10 Pain location: hips and low back, R knee Pain description: sharp, tight Aggravating factors: if pt crosses feet too long/awkward positioning Relieving factors: voltaren gel   FALLS: Has patient fallen in last 6 months? Yes. Number of falls : spouse estimates 20  LIVING ENVIRONMENT: Lives with: lives with their spouse Lives in: house, 1 level Stairs: No Has following equipment at home: [place] entry, handicapped accessible bathroom  PLOF: Needs assistance with ADLs  PATIENT GOALS: increase bilat hand coordination for ADLs  OBJECTIVE:   HAND DOMINANCE: Right  ADLs: Overall ADLs: caregiver assist for all ADLs; pt currently as 16 hrs of caregiver assist per week through the [place].  Pt recently was assigned 100% disability by the [place], and spouse reports caregiver hours will increase to 46 per week, but uncertain when Transfers/ambulation related to ADLs: supv; pt uses upright 4 wheeled walker Eating: caregivers help to cut food Grooming: mod A to shave with an electric razor UB Dressing: min A, difficulty with buttons LB Dressing: mod A, difficulty with zippers and buttons Toileting: occasional accidents with bowel and bladder, but less than weekly and only during the night; distant supv for toileting during the day Bathing: min A for bathing, mod A for drying off Tub Shower transfers: min guard  for shower transfers with grab bar and shower chair Equipment:  fully handicapped shower and toilet  IADLs: Shopping: dep on caregiver Light housekeeping: dep Meal Prep: dep Community mobility: short distance community amb with supv and 4 wheeled walker Medication management: dep Financial management: dep Handwriting:  printed first/last name 50% legible, cursive first/last name illegible  MOBILITY STATUS: Hx of falls  POSTURE COMMENTS:  rounded shoulders, forward head, increased thoracic kyphosis, and flexed trunk   FUNCTIONAL OUTCOME MEASURES: FOTO: 40.2 (completed with PT); predicted 48  UPPER EXTREMITY ROM:   Pt demos BUE shoulder ROM WFL for self care  UPPER EXTREMITY MMT:     MMT Right eval Left eval  Shoulder flexion 5 5  Shoulder abduction 5 5  Shoulder adduction    Shoulder extension    Shoulder internal rotation    Shoulder external rotation    Middle trapezius    Lower trapezius    Elbow flexion 5 5  Elbow extension 5 5  Wrist flexion 5 5  Wrist extension 5 5  Wrist ulnar deviation    Wrist radial deviation    Wrist pronation    Wrist supination    (Blank rows = not tested)  HAND FUNCTION: Grip strength: Right:  61 lbs; Left: 61 lbs, Lateral pinch: Right: 20 lbs, Left: 19 lbs, and 3 point pinch: Right: 11 lbs, Left: 15 lbs  COORDINATION: Finger Nose Finger test: increased time, L side touched lower than tip, corrected with min vc 9 Hole Peg test: Right: 1 min 18 sec; Left: 41 sec  SENSATION: Proprioception: Impaired   EDEMA: none  COGNITION: Overall cognitive status: History of cognitive impairments - at baseline; Parkinson's with dementia  VISION: Wears prism glasses; hx of visual hallucinations  PRAXIS: Impaired: Motor planning; bradykinesia, R>L  TODAY'S TREATMENT:                                                                                                                              Self Care: Practiced opening lens wipe packs and  cleaning each pair of pt's glasses.  Pt able to open pack with set up and clean glasses with min vc for thoroughness.  Pt requires extra time for sequencing and processing what to do with the wipe.  Practiced dexterity skills working with clothing fasteners this date.  Pt unbuttoned/buttoned 3 shirt buttons x3.  Practiced unknotting and knotting a small rope to simulate shoe laces; extra time to unknot.  Pt was able to complete 2 of 3 steps to tie a bow in the rope with set up only; max A to complete the 3rd step to pull "ear" through the hole. Simulated scooping food using a divided plate.  Pt was able to scoop small beads onto spoon consistently using dividers on plate.  Encouraged caregiver have spouse request divided plate from [place] tomorrow when they go for follow up appt.  Therapeutic Exercise: Facilitated hand strengthening with use of hand gripper set at 17.9# to remove jumbo pegs from pegboard x1 trial using R hand (1 trial only d/t tx session ending)  PATIENT EDUCATION: Education details: benefits of divided plate for scooping food onto utensil Person educated: Patient, Spouse, and Caregiver , [name] Education method: Explanation Education comprehension: verbalized understanding  HOME EXERCISE PROGRAM: To be established in the next 1-2 sessions   GOALS: Goals reviewed with patient? Yes  SHORT TERM GOALS: Target date: 04/11/22  1. Pt will complete HEP for increasing [place] and strength in bilat hands with direct supv at least 4 of 7 days per week. Baseline: not yet initiated Goal status: INITIAL  2. Pt will clean his glasses with min A and mod vc, including opening cleaning packet.   Baseline: max A  Goal status: initial     LONG TERM GOALS: Target date: 05/23/22  Pt will increase FOTO score by 5 or more points to indicate increase in self/caregiver perceived functional performance with daily tasks.  Baseline: 40.2 (predicted 48) Goal status: INITIAL  2.  Pt will increase R/L  lateral pinch strength by 4 or more lbs to enable pt to independently turn on electric toothbrush and twist open toothpaste. Baseline: R 20, L 19; caregiver assist to turn on toothbrush  and open toothpaste Goal status: INITIAL  3.  Pt will increase [place] in bilat hands to manage buttons and zippers on clothing 75% of the time.  Baseline: caregivers assist with clothing fasteners at least 50% of the time Goal status: INITIAL  4.  Pt will scoop food from plate/bowl with min vc using adapted utensils and adapted plates/bowls as needed.  Baseline: caregiver constantly turns plate to compensate for pt pushing food to 1 side. Goal status: INITIAL  5.  Pt will shave with electric razor with min A for thoroughness. Baseline: pt struggles to grade sufficient pressure to thoroughly shave face, requiring mod A. Goal status: INITIAL  ASSESSMENT:  CLINICAL IMPRESSION: Good tolerance to therapeutic exercises this date.  Pt again able to open lens wipe package to clean glasses with set up only.  Pt requires extra time for sequencing and processing what to do with the wipe.  Pt worked with Water engineer today, managing top 3 shirt buttons well, completing 2 of 3 steps to tie a bow, extra time to unknot a rope knot.  Pt found divided plate helpful for scooping food without caregiver having to turn plate.  Caregiver plans to have spouses ask [facility] plate during [facility] tomorrow.  Additional skilled OT will benefit pt to maximize hand strength and coordination for daily tasks, and provide strategies to maximize indep with ADLs and leisure activities to increase QOL.  PERFORMANCE DEFICITS: in functional skills including ADLs, IADLs, coordination, dexterity, proprioception, strength, pain, Fine motor control, Gross motor control, mobility, balance, body mechanics, endurance, decreased knowledge of use of DME, and UE functional use, cognitive skills including attention,  learn, memory, orientation, problem solving, safety awareness, thought, and understand. IMPAIRMENTS: are limiting patient from ADLs, IADLs, rest and sleep, leisure, and social participation.   CO-MORBIDITIES: has co-morbidities such as dementia  that affects occupational performance. Patient will benefit from skilled OT to address above impairments and improve overall function.  MODIFICATION OR ASSISTANCE TO COMPLETE EVALUATION: Min-Moderate modification of tasks or assist with assess necessary to complete an evaluation.  OT OCCUPATIONAL PROFILE AND HISTORY: Problem focused assessment: Including review of records relating to presenting problem.  CLINICAL DECISION MAKING: Moderate - several treatment options, min-mod task modification necessary  REHAB POTENTIAL: Good  EVALUATION COMPLEXITY: Moderate    PLAN:  OT FREQUENCY: 2x/week  OT DURATION: 12 weeks  PLANNED INTERVENTIONS: self care/ADL training, therapeutic exercise, therapeutic activity, neuromuscular re-education, manual therapy, balance training, functional mobility training, moist heat, cryotherapy, patient/family education, cognitive remediation/compensation, energy conservation, coping strategies training, and DME and/or AE instructions  RECOMMENDED OTHER SERVICES: N/A  CONSULTED AND AGREED WITH PLAN OF CARE: Patient and family member/caregiver  PLAN FOR NEXT SESSION: see eval  Leta Speller, MS, OTR/L  Darleene Cleaver, OT 03/14/2022, 3:49 PM

## 2022-03-21 ENCOUNTER — Encounter: Payer: Self-pay | Admitting: Physical Therapy

## 2022-03-21 ENCOUNTER — Ambulatory Visit: Payer: Medicare PPO | Attending: Family Medicine | Admitting: Physical Therapy

## 2022-03-21 ENCOUNTER — Ambulatory Visit: Payer: Medicare PPO

## 2022-03-21 DIAGNOSIS — M6281 Muscle weakness (generalized): Secondary | ICD-10-CM | POA: Insufficient documentation

## 2022-03-21 DIAGNOSIS — R269 Unspecified abnormalities of gait and mobility: Secondary | ICD-10-CM | POA: Diagnosis not present

## 2022-03-21 DIAGNOSIS — R296 Repeated falls: Secondary | ICD-10-CM | POA: Diagnosis not present

## 2022-03-21 DIAGNOSIS — R2681 Unsteadiness on feet: Secondary | ICD-10-CM | POA: Diagnosis not present

## 2022-03-21 DIAGNOSIS — R262 Difficulty in walking, not elsewhere classified: Secondary | ICD-10-CM | POA: Diagnosis not present

## 2022-03-21 DIAGNOSIS — R278 Other lack of coordination: Secondary | ICD-10-CM | POA: Diagnosis not present

## 2022-03-21 DIAGNOSIS — G20A1 Parkinson's disease without dyskinesia, without mention of fluctuations: Secondary | ICD-10-CM | POA: Diagnosis not present

## 2022-03-21 NOTE — Therapy (Signed)
OUTPATIENT OCCUPATIONAL THERAPY NEURO TREATMENT  Patient Name: [name] MRN: [id number] DOB:December 14, 1950, 71 y.o., male Today's Date: 03/21/2022  PCP: Dr. Elsie Stain REFERRING PROVIDER: Dr. Elsie Stain   OT End of Session - 03/21/22 1212     Visit Number 6    Number of Visits 24    Date for OT Re-Evaluation 05/23/22    OT Start Time 6578    OT Stop Time 40    OT Time Calculation (min) 45 min    Equipment Utilized During Treatment rollator    Activity Tolerance Patient tolerated treatment well    Behavior During Therapy [facility] for tasks assessed/performed               Past Medical History:  Diagnosis Date   Arthritis    Bradycardia    Cancer ([place]) 2013   skin cancer   Depression    ptsd   Dysrhythmia    chronic slow heart rate   GERD (gastroesophageal reflux disease)    Headache(784.0)    tension headaches non recent   History of chicken pox    History of kidney stones    passed   Hypertension    treated with HCTZ   Pacemaker    Parkinson's disease    dx'ed 15 years ago   PTSD (post-traumatic stress disorder)    Shortness of breath dyspnea    Sleep apnea    doesn't use C-pap   Varicose veins    Past Surgical History:  Procedure Laterality Date   CHOLECYSTECTOMY N/A 10/22/2014   Procedure: LAPAROSCOPIC CHOLECYSTECTOMY WITH INTRAOPERATIVE CHOLANGIOGRAM;  Surgeon: Dia Crawford III, MD;  Location: [facility] ORS;  Service: General;  Laterality: N/A;   cyst removed      from lip as a child   INTRAMEDULLARY (IM) NAIL INTERTROCHANTERIC N/A 02/18/2019   Procedure: INTRAMEDULLARY (IM) NAIL INTERTROCHANTRIC, RIGHT,;  Surgeon: Thornton Park, MD;  Location: [facility] ORS;  Service: Orthopedics;  Laterality: N/A;   LUMBAR LAMINECTOMY/DECOMPRESSION MICRODISCECTOMY Bilateral 12/14/2012   Procedure: Bilateral lumbar three-four, four-five decompressive laminotomy/foraminotomy;  Surgeon: Charlie Pitter, MD;  Location: [place] NEURO ORS;  Service: Neurosurgery;  Laterality: Bilateral;    PULSE GENERATOR IMPLANT Bilateral 12/13/2013   Procedure: Bilateral implantable pulse generator placement;  Surgeon: Erline Levine, MD;  Location: [place] NEURO ORS;  Service: Neurosurgery;  Laterality: Bilateral;  Bilateral implantable pulse generator placement   skin cancer removed     from ears,   12 lft arm  rt leg 15   SUBTHALAMIC STIMULATOR BATTERY REPLACEMENT Bilateral 07/14/2017   Procedure: BILATERAL IMPLANTED PULSE GENERATOR CHANGE FOR DEEP BRAIN STIMULATOR;  Surgeon: Erline Levine, MD;  Location: [place];  Service: Neurosurgery;  Laterality: Bilateral;   SUBTHALAMIC STIMULATOR INSERTION Bilateral 12/06/2013   Procedure: SUBTHALAMIC STIMULATOR INSERTION;  Surgeon: Erline Levine, MD;  Location: [place] NEURO ORS;  Service: Neurosurgery;  Laterality: Bilateral;  Bilateral deep brain stimulator placement   Patient Active Problem List   Diagnosis Date Noted   Hematuria 10/10/2021   Sinus drainage 12/09/2020   Medicare annual wellness visit, subsequent 11/18/2020   Pacemaker    Dementia associated with Parkinson's disease ([place]) 06/17/2020   Vertigo 02/19/2020   Urine abnormality 02/19/2020   Dysphagia 02/23/2019   Fall at home 10/18/2017   REM behavioral disorder 11/02/2016   Radicular pain in right arm 10/09/2016   GERD (gastroesophageal reflux disease) 07/31/2016   Colon cancer screening 07/23/2015   Gout 02/18/2015   Depression 01/06/2015   Skin lesion 10/02/2014   Dupuytren's contracture  08/20/2014   Cough 07/21/2014   Lumbar stenosis with neurogenic claudication 06/13/2014   S/P deep brain stimulator placement 05/08/2014   Advance care planning 01/19/2014   Parkinson's disease 12/13/2013   PTSD (post-traumatic stress disorder) 06/13/2013   Erectile dysfunction 06/13/2013   HLD (hyperlipidemia) 06/13/2013   Essential hypertension 06/03/2013   Bradycardia by electrocardiogram 06/03/2013   Obstructive sleep apnea 03/12/2013    ONSET DATE: 2015  REFERRING DIAG: Parkinson's  Disease  THERAPY DIAG:  Parkinson's disease without dyskinesia or fluctuating manifestations  Other lack of coordination  Muscle weakness (generalized)  Rationale for Evaluation and Treatment: Rehabilitation  SUBJECTIVE: Pt/caregiver report pt with increased pain today in the low back and R hip 3/10 with meds.  Pt did a lot of standing and walking over the weekend, and also had a fall last Thurs, per spouse.  SUBJECTIVE STATEMENT: Pt reports he and his caregiver remembered his laptop today but forget his reading glasses to be able to practice on his laptop.  Pt accompanied by:  caregiver [name])  PERTINENT HISTORY: Parkinson's Disease, dementia, DBS, multiple falls  PRECAUTIONS: Fall  WEIGHT BEARING RESTRICTIONS: No  PAIN:  Are you having pain? Yes: NPRS scale: 3/10 Pain location: hips and low back, R knee Pain description: sharp, tight Aggravating factors: if pt crosses feet too long/awkward positioning Relieving factors: voltaren gel   FALLS: Has patient fallen in last 6 months? Yes. Number of falls : spouse estimates 20  LIVING ENVIRONMENT: Lives with: lives with their spouse Lives in: house, 1 level Stairs: No Has following equipment at home: [place] entry, handicapped accessible bathroom  PLOF: Needs assistance with ADLs  PATIENT GOALS: increase bilat hand coordination for ADLs  OBJECTIVE:   HAND DOMINANCE: Right  ADLs: Overall ADLs: caregiver assist for all ADLs; pt currently as 16 hrs of caregiver assist per week through the [place].  Pt recently was assigned 100% disability by the [place], and spouse reports caregiver hours will increase to 46 per week, but uncertain when Transfers/ambulation related to ADLs: supv; pt uses upright 4 wheeled walker Eating: caregivers help to cut food Grooming: mod A to shave with an electric razor UB Dressing: min A, difficulty with buttons LB Dressing: mod A, difficulty with zippers and buttons Toileting: occasional accidents with  bowel and bladder, but less than weekly and only during the night; distant supv for toileting during the day Bathing: min A for bathing, mod A for drying off Tub Shower transfers: min guard for shower transfers with grab bar and shower chair Equipment:  fully handicapped shower and toilet  IADLs: Shopping: dep on caregiver Light housekeeping: dep Meal Prep: dep Community mobility: short distance community amb with supv and 4 wheeled walker Medication management: dep Financial management: dep Handwriting:  printed first/last name 50% legible, cursive first/last name illegible  MOBILITY STATUS: Hx of falls  POSTURE COMMENTS:  rounded shoulders, forward head, increased thoracic kyphosis, and flexed trunk   FUNCTIONAL OUTCOME MEASURES: FOTO: 40.2 (completed with PT); predicted 48  UPPER EXTREMITY ROM:   Pt demos BUE shoulder ROM WFL for self care  UPPER EXTREMITY MMT:     MMT Right eval Left eval  Shoulder flexion 5 5  Shoulder abduction 5 5  Shoulder adduction    Shoulder extension    Shoulder internal rotation    Shoulder external rotation    Middle trapezius    Lower trapezius    Elbow flexion 5 5  Elbow extension 5 5  Wrist flexion 5 5  Wrist  extension 5 5  Wrist ulnar deviation    Wrist radial deviation    Wrist pronation    Wrist supination    (Blank rows = not tested)  HAND FUNCTION: Grip strength: Right: 61 lbs; Left: 61 lbs, Lateral pinch: Right: 20 lbs, Left: 19 lbs, and 3 point pinch: Right: 11 lbs, Left: 15 lbs  COORDINATION: Finger Nose Finger test: increased time, L side touched lower than tip, corrected with min vc 9 Hole Peg test: Right: 1 min 18 sec; Left: 41 sec  SENSATION: Proprioception: Impaired   EDEMA: none  COGNITION: Overall cognitive status: History of cognitive impairments - at baseline; Parkinson's with dementia  VISION: Wears prism glasses; hx of visual hallucinations  PRAXIS: Impaired: Motor planning; bradykinesia,  R>L  TODAY'S TREATMENT:                                                                                                                              Self Care: Practiced opening lens wipe packs and cleaning each pair of pt's glasses.  Pt able to open pack with set up and clean glasses with min vc for thoroughness.  Required min vc to have thumb under wipe to avoid thumb smudging glasses.  Simulated scooping food using a universal cuff but with pt still keeping thumb and fingers wrapped around the spoon.  Cuff served as a way to secure spoon in hand in times of fatigue d/t spouse reporting occasional dropping of utensil.  Issued cuff for pt to practice with at home.    Neuro re-ed: Facilitated FMC/dexterity skills in each hand, working to pick up small beads from table top and place into small container.  Practiced scooping, storing, and translatory skills with small glass [place] stones, working to move stones from palm to fingertips in prep for discarding.  Pt required min-mod vc for technique.   PATIENT EDUCATION: Education details: use of universal cuff Person educated: Patient, Spouse, and Caregiver , [name] Education method: Explanation Education comprehension: verbalized understanding  HOME EXERCISE PROGRAM: To be established in the next 1-2 sessions   GOALS: Goals reviewed with patient? Yes  SHORT TERM GOALS: Target date: 04/11/22  1. Pt will complete HEP for increasing [place] and strength in bilat hands with direct supv at least 4 of 7 days per week. Baseline: not yet initiated Goal status: INITIAL  2. Pt will clean his glasses with min A and mod vc, including opening cleaning packet.   Baseline: max A  Goal status: initial     LONG TERM GOALS: Target date: 05/23/22  Pt will increase FOTO score by 5 or more points to indicate increase in self/caregiver perceived functional performance with daily tasks.  Baseline: 40.2 (predicted 48) Goal status: INITIAL  2.  Pt will increase  R/L lateral pinch strength by 4 or more lbs to enable pt to independently turn on electric toothbrush and twist open toothpaste. Baseline: R 20, L 19; caregiver assist to turn on  toothbrush and open toothpaste Goal status: INITIAL  3.  Pt will increase [place] in bilat hands to manage buttons and zippers on clothing 75% of the time.  Baseline: caregivers assist with clothing fasteners at least 50% of the time Goal status: INITIAL  4.  Pt will scoop food from plate/bowl with min vc using adapted utensils and adapted plates/bowls as needed.  Baseline: caregiver constantly turns plate to compensate for pt pushing food to 1 side. Goal status: INITIAL  5.  Pt will shave with electric razor with min A for thoroughness. Baseline: pt struggles to grade sufficient pressure to thoroughly shave face, requiring mod A. Goal status: INITIAL  ASSESSMENT:  CLINICAL IMPRESSION: Spouse and caregiver arrived today and present for tx.  Spouse brought 2 divided plates from home but stated that they had not been using them.  OT encouraged use d/t pt performing well with scooping simulated food last session and this date without OT having to turn plate using divided plate from clinic.  Spouse reported that pt is struggling with dry eyes and blurry vision lately.  Pt has a red plate and a cream colored divided plate and OT encouraged spouse choose between these colors each day based on which plate provides the best color contrast with his food; spouse receptive and verbalized understanding of benefits of the visual contrast.  Pt did well with the universal cuff this date and kept thumb and fingers wrapped around the spoon.  Cuff served as a way to secure spoon in hand in times of fatigue d/t spouse reporting occasional dropping of utensil.  Issued cuff for pt to practice with at home.  Pt continues to develop bilat hand dexterity.  Occasional dropping of small beads and [place] stones during translatory movements, but  overall improved technique with min-mod vc and repetition.  Additional skilled OT will benefit pt to maximize hand strength and coordination for daily tasks, and provide strategies to maximize indep with ADLs and leisure activities to increase QOL.  PERFORMANCE DEFICITS: in functional skills including ADLs, IADLs, coordination, dexterity, proprioception, strength, pain, Fine motor control, Gross motor control, mobility, balance, body mechanics, endurance, decreased knowledge of use of DME, and UE functional use, cognitive skills including attention, learn, memory, orientation, problem solving, safety awareness, thought, and understand. IMPAIRMENTS: are limiting patient from ADLs, IADLs, rest and sleep, leisure, and social participation.   CO-MORBIDITIES: has co-morbidities such as dementia  that affects occupational performance. Patient will benefit from skilled OT to address above impairments and improve overall function.  MODIFICATION OR ASSISTANCE TO COMPLETE EVALUATION: Min-Moderate modification of tasks or assist with assess necessary to complete an evaluation.  OT OCCUPATIONAL PROFILE AND HISTORY: Problem focused assessment: Including review of records relating to presenting problem.  CLINICAL DECISION MAKING: Moderate - several treatment options, min-mod task modification necessary  REHAB POTENTIAL: Good  EVALUATION COMPLEXITY: Moderate    PLAN:  OT FREQUENCY: 2x/week  OT DURATION: 12 weeks  PLANNED INTERVENTIONS: self care/ADL training, therapeutic exercise, therapeutic activity, neuromuscular re-education, manual therapy, balance training, functional mobility training, moist heat, cryotherapy, patient/family education, cognitive remediation/compensation, energy conservation, coping strategies training, and DME and/or AE instructions  RECOMMENDED OTHER SERVICES: N/A  CONSULTED AND AGREED WITH PLAN OF CARE: Patient and family member/caregiver  PLAN FOR NEXT SESSION: see  eval  Leta Speller, MS, OTR/L  Darleene Cleaver, OT 03/21/2022, 1:07 PM

## 2022-03-21 NOTE — Therapy (Signed)
OUTPATIENT PHYSICAL THERAPY NEURO Treatment   Patient Name: [name] MRN: [id number] DOB:07-15-1950, 71 y.o., male Today's Date: 03/21/2022   PCP: Dr. Elsie Stain REFERRING PROVIDER: Dr. Sharolyn Douglas   PT End of Session - 03/21/22 1108     Visit Number 6    Number of Visits 24    Date for PT Re-Evaluation 05/23/22    Authorization Type [employer] PPO    Authorization Time Period Recert [id number]    Progress Note Due on Visit 10    PT Start Time 1104    PT Stop Time 1145    PT Time Calculation (min) 41 min    Equipment Utilized During Treatment Gait belt    Activity Tolerance Patient tolerated treatment well    Behavior During Therapy WFL for tasks assessed/performed                   Past Medical History:  Diagnosis Date   Arthritis    Bradycardia    Cancer ([place]) 2013   skin cancer   Depression    ptsd   Dysrhythmia    chronic slow heart rate   GERD (gastroesophageal reflux disease)    Headache(784.0)    tension headaches non recent   History of chicken pox    History of kidney stones    passed   Hypertension    treated with HCTZ   Pacemaker    Parkinson's disease    dx'ed 15 years ago   PTSD (post-traumatic stress disorder)    Shortness of breath dyspnea    Sleep apnea    doesn't use C-pap   Varicose veins    Past Surgical History:  Procedure Laterality Date   CHOLECYSTECTOMY N/A 10/22/2014   Procedure: LAPAROSCOPIC CHOLECYSTECTOMY WITH INTRAOPERATIVE CHOLANGIOGRAM;  Surgeon: Dia Crawford III, MD;  Location: [facility] ORS;  Service: General;  Laterality: N/A;   cyst removed      from lip as a child   INTRAMEDULLARY (IM) NAIL INTERTROCHANTERIC N/A 02/18/2019   Procedure: INTRAMEDULLARY (IM) NAIL INTERTROCHANTRIC, RIGHT,;  Surgeon: Thornton Park, MD;  Location: [facility] ORS;  Service: Orthopedics;  Laterality: N/A;   LUMBAR LAMINECTOMY/DECOMPRESSION MICRODISCECTOMY Bilateral 12/14/2012   Procedure: Bilateral lumbar three-four,  four-five decompressive laminotomy/foraminotomy;  Surgeon: Charlie Pitter, MD;  Location: [place] [facility];  Service: Neurosurgery;  Laterality: Bilateral;   PULSE GENERATOR IMPLANT Bilateral 12/13/2013   Procedure: Bilateral implantable pulse generator placement;  Surgeon: Erline Levine, MD;  Location: [place] [facility];  Service: Neurosurgery;  Laterality: Bilateral;  Bilateral implantable pulse generator placement   skin cancer removed     from ears,   12 lft arm  rt leg 15   SUBTHALAMIC STIMULATOR BATTERY REPLACEMENT Bilateral 07/14/2017   Procedure: BILATERAL IMPLANTED PULSE GENERATOR CHANGE FOR DEEP BRAIN STIMULATOR;  Surgeon: Erline Levine, MD;  Location: [place];  Service: Neurosurgery;  Laterality: Bilateral;   SUBTHALAMIC STIMULATOR INSERTION Bilateral 12/06/2013   Procedure: SUBTHALAMIC STIMULATOR INSERTION;  Surgeon: Erline Levine, MD;  Location: [place] [facility];  Service: Neurosurgery;  Laterality: Bilateral;  Bilateral deep brain stimulator placement   Patient Active Problem List   Diagnosis Date Noted   Hematuria 10/10/2021   Sinus drainage 12/09/2020   Medicare annual wellness visit, subsequent 11/18/2020   Pacemaker    Dementia associated with Parkinson's disease ([place]) 06/17/2020   Vertigo 02/19/2020   Urine abnormality 02/19/2020   Dysphagia 02/23/2019   Fall at home 10/18/2017   REM behavioral disorder 11/02/2016   Radicular pain in  right arm 10/09/2016   GERD (gastroesophageal reflux disease) 07/31/2016   Colon cancer screening 07/23/2015   Gout 02/18/2015   Depression 01/06/2015   Skin lesion 10/02/2014   Dupuytren's contracture 08/20/2014   Cough 07/21/2014   Lumbar stenosis with neurogenic claudication 06/13/2014   S/P deep brain stimulator placement 05/08/2014   Advance care planning 01/19/2014   Parkinson's disease 12/13/2013   PTSD (post-traumatic stress disorder) 06/13/2013   Erectile dysfunction 06/13/2013   HLD (hyperlipidemia) 06/13/2013   Essential hypertension  06/03/2013   Bradycardia by electrocardiogram 06/03/2013   Obstructive sleep apnea 03/12/2013    ONSET DATE: 15 years ago per chart  REFERRING DIAG: Parkinson's disease  THERAPY DIAG:  Parkinson's disease without dyskinesia or fluctuating manifestations  Difficulty in walking, not elsewhere classified  Unsteadiness on feet  Repeated falls  Abnormality of gait and mobility  Rationale for Evaluation and Treatment Rehabilitation  SUBJECTIVE:                                                                                                                                                                                              SUBJECTIVE STATEMENT: Patient caregiver and physical therapist spent ample time discussing patient's cause of falls.  They seem to be related to not utilizing assistive device properly or not using assist device at all.  Patient reports he forgets he needs a device until he ends up stranded without any way to balance himself.  Physical therapist explains this may be due to due to dual task difficulty in dual task testing performed this date does confirm this.  Also the task [place] activities performed in therapy have been in line with working on remembering the walker when he is performing transitions or thinking about multiple things. Pt accompanied by: significant other  PERTINENT HISTORY:   Date of most recent Admission: 09/25/2021 Date of Discharge: 09/28/2021  Discharge Diagnoses:  Altered mental status Parkinson disease (CMS/HCC) Hallucinations  Per [facility] discharge summary notes PHI : Hospital Course:   71 y.o. male with a history of Parkinson's Disease w/ DBS in place, PD-related dementia, OSA, depression, and seizure disorder who presented for EEG given concern for seizures.  Altered mental status Concern seizure activity Periods of unresponsiveness on the setting of new medication--Nuplazid (pimavanserin) recently started. No events of note on  continuous EEG. This was held and he returned to his baseline mental status. Other workup including CT head, CTA h/n without obvious cause for periodic unresponsiveness. Nuplazid was discontinued.   Aspiration, weakness Of note, he had a coughing spell while eating though sounds like was precipitated by sneezing. Did well on  RA, seen by SLP without any further needs or concerns.  Parkinson's Disease Parkinson's-related dementia - He follows in the Movement Disorders center at [facility]. Clinic notes suggest he seems to have worsening of his overall disease trajectory. - Continued home Memantine and Sinemet      PMH: HTN, bradycardia s/p PPM, PD, DBS, COVID-19 infection, chronic low back pain s/p L4/5 surgery.  PAIN:  Are you having pain? Yes: NPRS scale: 7/10 Pain location: Low back Pain description: ache Aggravating factors: Prolonged standing/sitting, bending over Relieving factors: rest, changing positions  PRECAUTIONS: Fall  WEIGHT BEARING RESTRICTIONS No  FALLS: Has patient fallen in last 6 months? Yes. Number of falls >10  LIVING ENVIRONMENT: Lives with: lives with their spouse Lives in: House/apartment Stairs:  Ramp Has following equipment at home: Single point cane, Environmental consultant - 4 wheeled, Wheelchair (manual), shower chair, bed side commode, Grab bars, and Ramped entry  PLOF: Needs assistance with ADLs, Needs assistance with homemaking, Needs assistance with gait, and Needs assistance with transfers  PATIENT GOALS To walk better and not fall. Improve fall frequency to prevent significant injury   OBJECTIVE: (objective measures completed at initial evaluation unless otherwise dated)   DIAGNOSTIC FINDINGS:   COGNITION: Overall cognitive status: History of cognitive impairments - at baseline   SENSATION: Light touch: WFL  COORDINATION: Decreased and slow to initiate  EDEMA:  None observed     POSTURE: rounded shoulders, forward head, increased thoracic kyphosis,  and flexed trunk      TRANSFERS: Assistive device utilized:  Upright 4WW   Sit to stand:  Min/mod assist- with increased VC for safety Stand to sit:  Min assist with Verbal cues to lock brakes and reach back Chair to chair: Min A     GAIT: Gait pattern: decreased arm swing- Right, decreased arm swing- Left, decreased step length- Right, decreased step length- Left, decreased stance time- Right, decreased stance time- Left, decreased stride length, and shuffling Distance walked: 150 ft. Assistive device utilized:  upright 4WW Level of assistance: Min A Comments: Shuffling gait with slow gait speed  FUNCTIONAL TESTs:  5 times sit to stand: 19.5 sec with UE support with BUE support Timed up and go (TUG): 37.26 sec with upright 4WW 6 minute walk test: 720 feet using upright 4WW 10 meter walk test: 0.67 m/s  PATIENT SURVEYS:  FOTO 50  TODAY'S TREATMENT:  03/21/22     Spent majority of time performing sit to stand transitions and then performing ambulation with up walker to a certain obstacle and then performing sit to stand transition and continuing with this pattern to work on ensuring patient uses walker when performing sit to stand transitions as well as uses walker following completing other tasks such as shooting baskets.  Dual Task assessment: TUG normal with rollator 28.6 sec Dual Task TUG:44.5 sec      Unless otherwise stated, CGA was provided and gait belt donned in order to ensure pt safety    PATIENT EDUCATION: Education details: PT plan of care; purpose of functional outcomes Person educated: Patient and Spouse Education method: Explanation and Verbal cues Education comprehension: verbalized understanding   HOME EXERCISE PROGRAM: To be reviewed and progressed next 1-2 visits    GOALS: Goals reviewed with patient? Yes  SHORT TERM GOALS: Target date: 01/11/2022  Pt will be independent with HEP in order to improve strength and balance in order to decrease  fall risk and improve function at home and work.  Baseline: Patient reports not performing much  in the way of any HEP except walking. Goal status: INITIAL  LONG TERM GOALS: Target date: 02/22/2022  Pt will decrease 5TSTS by at least 5 seconds in order to demonstrate clinically significant improvement in LE strength. Baseline: 02/28/2022: 19.5 sec with UE support   Goal status: INITIAL  2.  Pt will improve FOTO to target score of 48 to display perceived improvements in ability to complete ADL's.  Baseline: 02/28/22=40 Goal status: INITIAL  3.  Pt will decrease TUG to < or = to 25 seconds/decrease in order to demonstrate decreased fall risk. Baseline: 02/28/22= 37.26 sec using upright 4WW Goal status: INITIAL  4.  Pt will increase 10MWT by at least 0.13 m/s in order to demonstrate clinically significant improvement in community ambulation.   Baseline: 02/28/22= 0.67 m/s using upright 4WW Goal status: INITIAL  5.  Patient will increase six minute walk test distance to >1000 for progression to community ambulator and improve gait ability  Baseline: 02/28/22: 720 feet with up walker  Goal status: INITIAL  6.  Patient will experience 1 fall or less per 2 week period for at least 3 weeks in order to improve his frequency of falls and prevent injury from falls  Baseline: 02/28/22: pt falls approximately once every 10 days  Goal status: INITIAL  ASSESSMENT:  CLINICAL IMPRESSION: Patient presents with good motivation for completion of physical therapy activities.  Continued with balance obstacle course to focus on functional tasks that have caused falls like STS transitions with up walker.  Patient tested with dual task interventions and does demonstrate significant impairment performing activities with dual task evidenced by normal tug versus dual task tug.  Will continue to incorporate dual task interventions to patient's functional balance interventions in future sessions.  Pt will continue  to benefit from skilled physical therapy intervention to address impairments, improve QOL, and attain therapy goals.     OBJECTIVE IMPAIRMENTS Abnormal gait, decreased activity tolerance, decreased balance, decreased cognition, decreased coordination, decreased endurance, decreased mobility, difficulty walking, decreased ROM, decreased strength, decreased safety awareness, hypomobility, impaired perceived functional ability, and pain.   ACTIVITY LIMITATIONS carrying, lifting, bending, standing, squatting, stairs, transfers, bed mobility, bathing, toileting, dressing, self feeding, hygiene/grooming, and caring for others  PARTICIPATION LIMITATIONS: meal prep, cleaning, laundry, medication management, personal finances, driving, shopping, community activity, and yard work  PERSONAL FACTORS Time since onset of injury/illness/exacerbation are also affecting patient's functional outcome.   REHAB POTENTIAL: Fair    CLINICAL DECISION MAKING: Evolving/moderate complexity  EVALUATION COMPLEXITY: Moderate  PLAN: PT FREQUENCY: 2x/week  PT DURATION: 12 weeks  PLANNED INTERVENTIONS: Therapeutic exercises, Therapeutic activity, Neuromuscular re-education, Balance training, Gait training, Patient/Family education, Self Care, Joint mobilization, Joint manipulation, Stair training, Vestibular training, Canalith repositioning, DME instructions, Dry Needling, Electrical stimulation, Spinal manipulation, Spinal mobilization, Cryotherapy, Moist heat, Manual therapy, and Re-evaluation  PLAN FOR NEXT SESSION: Begin New HEP, work with pt regarding safety at home.    Particia Lather, PT 03/21/2022, 2:58 PM

## 2022-03-22 ENCOUNTER — Ambulatory Visit (INDEPENDENT_AMBULATORY_CARE_PROVIDER_SITE_OTHER): Payer: Medicare PPO | Admitting: Psychology

## 2022-03-22 DIAGNOSIS — F331 Major depressive disorder, recurrent, moderate: Secondary | ICD-10-CM

## 2022-03-22 DIAGNOSIS — F4323 Adjustment disorder with mixed anxiety and depressed mood: Secondary | ICD-10-CM | POA: Diagnosis not present

## 2022-03-22 NOTE — Progress Notes (Signed)
[place] Counselor/Therapist Progress Note  Patient ID: [name], MRN: [id number],    Date: 03/22/2022  Time Spent: 45 minutes  Treatment Type: Indivdual  Reported Symptoms: sadness, and boredom  Mental Status Exam: Appearance:  Casual     Behavior: normal  Motor: Psychomotor Retardation  Speech/Language:  Slow  Affect: flat  Mood: normal  Thought process: concrete  Thought content:   WNL  Sensory/Perceptual disturbances:   WNL  Orientation: oriented to person, place, time/date, and situation  Attention: Fair  Concentration: Fair  Memory: Immediate;   [place] of knowledge:  Fair  Insight:   Fair  Judgment:  Fair  Impulse Control: Fair   Risk Assessment: Danger to Self:  No Self-injurious Behavior: No Danger to Others: No Duty to Warn:no Physical Aggression / Violence:No  Access to Firearms a concern: No  Gang Involvement:No   Subjective: The patient attended a face-to-face individual therapy session  via video visit today.  The patient gave verbal consent for the video to be on WebEx.  The patient was in his home  and therapist was in the office.  The patient presents with a flat affect and mood is pleasant.  The patient reports that he is doing okay and he and his wife are very happy about getting the disability that he has gotten from the [place].  The patient reports that he feels like things will be better because they will not have to worry as much about money.  His wife also has some support that she can get through the [place] and this is a good thing as well.  The patient is smiling and pleasant today and he does not seem as depressed as he has in the past.  He seems to be managing things better and I think it is been helpful for him to have the new kitten in the house as well.  We talked about what is coming up as far as the holidays and her next appointment is scheduled in mid January because of the holidays.  The patient is aware that he can  contact me if needed prior to his next appointment.  Interventions: Ego-Supportive, CBT  Diagnosis:Major depressive disorder, recurrent episode, moderate (HCC)  Adjustment disorder with mixed anxiety and depressed mood  Plan: Client Abilities/Strengths  Pt is bright and has a good sense of humor.  Client Treatment Preferences  Individual therapy.  Client Statement of Needs  improve coping skills regarding chronic illness.  Treatment Level  Outpatient Individual therapy  Symptoms  Depressed or irritable mood.: No Description Entered (Status: improved). Diminished interest in or  enjoyment of activities.: No Description Entered (Status: maintained). Feelings of hopelessness,  worthlessness, or inappropriate guilt.: No Description Entered (Status: improved). Lack of energy.: No  Description Entered (Status: maintained). Psychomotor agitation or retardation.: No Description  Entered (Status: maintained). Social withdrawal.: No Description Entered (Status: improved).  Problems Addressed  Unipolar Depression, Unipolar Depression, Unipolar Depression, Unipolar Depression  Goals 1. Alleviate depressive symptoms and return to previous level of effective  functioning. 2. Develop healthy interpersonal relationships that lead to the alleviation  and help prevent the relapse of depression. Objective Describe current and past experiences with depression including their impact on functioning and  attempts to resolve it. Target Date: 2022-04-30 Frequency: Monthly Progress: 60 Modality: individual Related Interventions 1. Encourage the client to share his/her thoughts and feelings of depression; express empathy and  build rapport while identifying primary cognitive, behavioral, interpersonal, or other  contributors to depression. Objective Learn and implement behavioral strategies to overcome depression. Target Date: 2022-04-30 Frequency: Monthly Progress: 80 Modality:  individual Objective Identify and replace thoughts and beliefs that support depression. Target Date: 2022-04-30 Frequency: Monthly Progress: 70 Modality: individual Related Interventions 1. Conduct Cognitive-Behavioral Therapy (see Cognitive Behavior Therapy by Olevia Bowens; Overcoming Depression by Lynita Lombard al.), beginning with helping the client learn the connection among  cognition, depressive feelings, and actions. 2. Explore and restructure underlying assumptions and beliefs reflected in biased self-talk that  may put the client at risk for relapse or recurrence. 3. Facilitate and reinforce the client's shift from biased depressive self-talk and beliefs to realitybased cognitive messages that enhance self-confidence and increase adaptive actions (see  "Positive Self-Talk" in the Adult Psychotherapy Homework Planner by Bryn Gulling). Objective Learn and implement problem-solving and decision-making skills. Target Date: 2022-04-30 Frequency: Monthly Progress: 70 Modality: individual Related Interventions 1. Encourage in the client the development of a positive problem orientation in which problems  and solving them are viewed as a natural part of life and not something to be feared, despaired,  or avoided. 2. Conduct Problem-Solving Therapy (see Problem-Solving Therapy by Shawnee Knapp and Delane Ginger) using techniques such as psychoeducation, modeling, and role-playing to teach client problem-solving skills (i.e., defining a problem specifically, generating possible solutions, evaluating the pros  and cons of each solution, selecting and implementing a plan of action, evaluating the efficacy  of the plan, accepting or revising the plan); role-play application of the problem-solving skill to  a real life issue (or assign "Applying Problem-Solving to Interpersonal Conflict" in the Adult  Psychotherapy Homework Planner by Bryn Gulling). 3. Develop healthy thinking patterns and beliefs about self, others, and  the world that lead to the alleviation and help prevent the relapse of  depression. 4. Recognize, accept, and cope with feelings of depression. Diagnosis Axis  none F43.21 (Adjustment Disorder, With depressed mood) - Open -  [Signifier: n/a]  Adjustment Disorder,  With Depressed Mood  Axis  none 296.32 (Major depressive affective disorder, recurrent episode,  moderate) - Open - [Signifier: n/a]  Conditions For Discharge Achievement of treatment goals and objectives   Plan is to continue to offer supportive therapy to patient to help him deal with his depression related to his Parkinsons diagnosis.  Patient approved Treatment Plan   Chenoah Mcnally Rejeana Brock, LCSW

## 2022-03-23 ENCOUNTER — Ambulatory Visit: Payer: Medicare PPO

## 2022-03-23 ENCOUNTER — Ambulatory Visit: Payer: Medicare PPO | Admitting: Physical Therapy

## 2022-03-23 DIAGNOSIS — R2681 Unsteadiness on feet: Secondary | ICD-10-CM

## 2022-03-23 DIAGNOSIS — R278 Other lack of coordination: Secondary | ICD-10-CM | POA: Diagnosis not present

## 2022-03-23 DIAGNOSIS — R296 Repeated falls: Secondary | ICD-10-CM

## 2022-03-23 DIAGNOSIS — R262 Difficulty in walking, not elsewhere classified: Secondary | ICD-10-CM

## 2022-03-23 DIAGNOSIS — M6281 Muscle weakness (generalized): Secondary | ICD-10-CM

## 2022-03-23 DIAGNOSIS — R269 Unspecified abnormalities of gait and mobility: Secondary | ICD-10-CM | POA: Diagnosis not present

## 2022-03-23 DIAGNOSIS — G20A1 Parkinson's disease without dyskinesia, without mention of fluctuations: Secondary | ICD-10-CM

## 2022-03-23 NOTE — Therapy (Signed)
OUTPATIENT PHYSICAL THERAPY NEURO Treatment   Patient Name: STANLEY LYNESS MRN: [id number] DOB:[birth year], 71 y.o., male Today's Date: 03/23/2022   PCP: Dr. Elsie Stain REFERRING PROVIDER: Dr. Sharolyn Douglas   PT End of Session - 03/23/22 1106     Visit Number 7    Number of Visits 24    Date for PT Re-Evaluation 05/23/22    Authorization Type [employer] PPO    Authorization Time Period Recert [id number]    Progress Note Due on Visit 10    PT Start Time 1100    PT Stop Time 1144    PT Time Calculation (min) 44 min    Equipment Utilized During Treatment Gait belt    Activity Tolerance Patient tolerated treatment well    Behavior During Therapy WFL for tasks assessed/performed                   Past Medical History:  Diagnosis Date   Arthritis    Bradycardia    Cancer ([place]) 2013   skin cancer   Depression    ptsd   Dysrhythmia    chronic slow heart rate   GERD (gastroesophageal reflux disease)    Headache(784.0)    tension headaches non recent   History of chicken pox    History of kidney stones    passed   Hypertension    treated with HCTZ   Pacemaker    Parkinson's disease    dx'ed 15 years ago   PTSD (post-traumatic stress disorder)    Shortness of breath dyspnea    Sleep apnea    doesn't use C-pap   Varicose veins    Past Surgical History:  Procedure Laterality Date   CHOLECYSTECTOMY N/A 10/22/2014   Procedure: LAPAROSCOPIC CHOLECYSTECTOMY WITH INTRAOPERATIVE CHOLANGIOGRAM;  Surgeon: Dia Crawford III, MD;  Location: [facility];  Service: General;  Laterality: N/A;   cyst removed      from lip as a child   INTRAMEDULLARY (IM) NAIL INTERTROCHANTERIC N/A 02/18/2019   Procedure: INTRAMEDULLARY (IM) NAIL INTERTROCHANTRIC, RIGHT,;  Surgeon: Thornton Park, MD;  Location: [facility];  Service: Orthopedics;  Laterality: N/A;   LUMBAR LAMINECTOMY/DECOMPRESSION MICRODISCECTOMY Bilateral 12/14/2012   Procedure: Bilateral lumbar three-four,  four-five decompressive laminotomy/foraminotomy;  Surgeon: Charlie Pitter, MD;  Location: [place] [facility];  Service: Neurosurgery;  Laterality: Bilateral;   PULSE GENERATOR IMPLANT Bilateral 12/13/2013   Procedure: Bilateral implantable pulse generator placement;  Surgeon: Erline Levine, MD;  Location: [place] [facility];  Service: Neurosurgery;  Laterality: Bilateral;  Bilateral implantable pulse generator placement   skin cancer removed     from ears,   12 lft arm  rt leg 15   SUBTHALAMIC STIMULATOR BATTERY REPLACEMENT Bilateral 07/14/2017   Procedure: BILATERAL IMPLANTED PULSE GENERATOR CHANGE FOR DEEP BRAIN STIMULATOR;  Surgeon: Erline Levine, MD;  Location: [place];  Service: Neurosurgery;  Laterality: Bilateral;   SUBTHALAMIC STIMULATOR INSERTION Bilateral 12/06/2013   Procedure: SUBTHALAMIC STIMULATOR INSERTION;  Surgeon: Erline Levine, MD;  Location: [place] [facility];  Service: Neurosurgery;  Laterality: Bilateral;  Bilateral deep brain stimulator placement   Patient Active Problem List   Diagnosis Date Noted   Hematuria 10/10/2021   Sinus drainage 12/09/2020   Medicare annual wellness visit, subsequent 11/18/2020   Pacemaker    Dementia associated with Parkinson's disease ([place]) 06/17/2020   Vertigo 02/19/2020   Urine abnormality 02/19/2020   Dysphagia 02/23/2019   Fall at home 10/18/2017   REM behavioral disorder 11/02/2016   Radicular pain in  right arm 10/09/2016   GERD (gastroesophageal reflux disease) 07/31/2016   Colon cancer screening 07/23/2015   Gout 02/18/2015   Depression 01/06/2015   Skin lesion 10/02/2014   Dupuytren's contracture 08/20/2014   Cough 07/21/2014   Lumbar stenosis with neurogenic claudication 06/13/2014   S/P deep brain stimulator placement 05/08/2014   Advance care planning 01/19/2014   Parkinson's disease 12/13/2013   PTSD (post-traumatic stress disorder) 06/13/2013   Erectile dysfunction 06/13/2013   HLD (hyperlipidemia) 06/13/2013   Essential hypertension  06/03/2013   Bradycardia by electrocardiogram 06/03/2013   Obstructive sleep apnea 03/12/2013    ONSET DATE: 15 years ago per chart  REFERRING DIAG: Parkinson's disease  THERAPY DIAG:  Parkinson's disease without dyskinesia or fluctuating manifestations  Difficulty in walking, not elsewhere classified  Unsteadiness on feet  Repeated falls  Abnormality of gait and mobility  Rationale for Evaluation and Treatment Rehabilitation  SUBJECTIVE:                                                                                                                                                                                              SUBJECTIVE STATEMENT: Pt reports no falls or Lob since last visit. No other changes to medical history noted. Pt accompanied by: significant other  PERTINENT HISTORY:   Date of most recent Admission: 09/25/2021 Date of Discharge: 09/28/2021  Discharge Diagnoses:  Altered mental status Parkinson disease (CMS/HCC) Hallucinations  Per [facility] discharge summary notes PHI : Hospital Course:   71 y.o. male with a history of Parkinson's Disease w/ DBS in place, PD-related dementia, OSA, depression, and seizure disorder who presented for EEG given concern for seizures.  Altered mental status Concern seizure activity Periods of unresponsiveness on the setting of new medication--Nuplazid (pimavanserin) recently started. No events of note on continuous EEG. This was held and he returned to his baseline mental status. Other workup including CT head, CTA h/n without obvious cause for periodic unresponsiveness. Nuplazid was discontinued.   Aspiration, weakness Of note, he had a coughing spell while eating though sounds like was precipitated by sneezing. Did well on RA, seen by SLP without any further needs or concerns.  Parkinson's Disease Parkinson's-related dementia - He follows in the [facility] center at [facility]. Clinic notes suggest he seems to have  worsening of his overall disease trajectory. - Continued home Memantine and Sinemet      PMH: HTN, bradycardia s/p PPM, PD, DBS, COVID-19 infection, chronic low back pain s/p L4/5 surgery.  PAIN:  Are you having pain? Yes: NPRS scale: 7/10 Pain location: Low back Pain description: ache Aggravating factors: Prolonged standing/sitting, bending over  Relieving factors: rest, changing positions  PRECAUTIONS: Fall  WEIGHT BEARING RESTRICTIONS No  FALLS: Has patient fallen in last 6 months? Yes. Number of falls >10  LIVING ENVIRONMENT: Lives with: lives with their spouse Lives in: House/apartment Stairs:  Ramp Has following equipment at home: Single point cane, Environmental consultant - 4 wheeled, Wheelchair (manual), shower chair, bed side commode, Grab bars, and Ramped entry  PLOF: Needs assistance with ADLs, Needs assistance with homemaking, Needs assistance with gait, and Needs assistance with transfers  PATIENT GOALS To walk better and not fall. Improve fall frequency to prevent significant injury   OBJECTIVE: (objective measures completed at initial evaluation unless otherwise dated)   DIAGNOSTIC FINDINGS:   COGNITION: Overall cognitive status: History of cognitive impairments - at baseline   SENSATION: Light touch: WFL  COORDINATION: Decreased and slow to initiate  EDEMA:  None observed     POSTURE: rounded shoulders, forward head, increased thoracic kyphosis, and flexed trunk      TRANSFERS: Assistive device utilized:  Upright 4WW   Sit to stand:  Min/mod assist- with increased VC for safety Stand to sit:  Min assist with Verbal cues to lock brakes and reach back Chair to chair: Min A     GAIT: Gait pattern: decreased arm swing- Right, decreased arm swing- Left, decreased step length- Right, decreased step length- Left, decreased stance time- Right, decreased stance time- Left, decreased stride length, and shuffling Distance walked: 150 ft. Assistive device  utilized:  upright 4WW Level of assistance: Min A Comments: Shuffling gait with slow gait speed  FUNCTIONAL TESTs:  5 times sit to stand: 19.5 sec with UE support with BUE support Timed up and go (TUG): 37.26 sec with upright 4WW 6 minute walk test: 720 feet using upright 4WW 10 meter walk test: 0.67 m/s  PATIENT SURVEYS:  FOTO 50  TODAY'S TREATMENT:  03/23/22     Obstacle course using random stations with blaze pods for intervention. Completed 8 obstacles including STS, boxing and basket shooting while navigation around clinic with up walker. No LOB but difficulty with object navigation.   Blaze pod stepping at random with ant step taps and lateral stepping and 10 sec holds, completes 2 x 14 reps. Difficulty with R LE WB primarily. Cues for upright posture.        LAQ 4# AW xx 10 ea LE     Unless otherwise stated, CGA was provided and gait belt donned in order to ensure pt safety    PATIENT EDUCATION: Education details: PT plan of care; purpose of functional outcomes Person educated: Patient and Spouse Education method: Explanation and Verbal cues Education comprehension: verbalized understanding   HOME EXERCISE PROGRAM: To be reviewed and progressed next 1-2 visits    GOALS: Goals reviewed with patient? Yes  SHORT TERM GOALS: Target date: 01/11/2022  Pt will be independent with HEP in order to improve strength and balance in order to decrease fall risk and improve function at home and work.  Baseline: Patient reports not performing much in the way of any HEP except walking. Goal status: INITIAL  LONG TERM GOALS: Target date: 02/22/2022  Pt will decrease 5TSTS by at least 5 seconds in order to demonstrate clinically significant improvement in LE strength. Baseline: 02/28/2022: 19.5 sec with UE support   Goal status: INITIAL  2.  Pt will improve FOTO to target score of 48 to display perceived improvements in ability to complete ADL's.  Baseline: 02/28/22=40 Goal  status: INITIAL  3.  Pt will  decrease TUG to < or = to 25 seconds/decrease in order to demonstrate decreased fall risk. Baseline: 02/28/22= 37.26 sec using upright 4WW Goal status: INITIAL  4.  Pt will increase 10MWT by at least 0.13 m/s in order to demonstrate clinically significant improvement in community ambulation.   Baseline: 02/28/22= 0.67 m/s using upright 4WW Goal status: INITIAL  5.  Patient will increase six minute walk test distance to >1000 for progression to community ambulator and improve gait ability  Baseline: 02/28/22: 720 feet with up walker  Goal status: INITIAL  6.  Patient will experience 1 fall or less per 2 week period for at least 3 weeks in order to improve his frequency of falls and prevent injury from falls  Baseline: 02/28/22: pt falls approximately once every 10 days  Goal status: INITIAL  ASSESSMENT:  CLINICAL IMPRESSION: Patient presents with good motivation for completion of physical therapy activities.  Continued with balance obstacle course with addition of dual task challenge using blaze pods for interventions as noted.  Pt will continue to benefit from skilled physical therapy intervention to address impairments, improve QOL, and attain therapy goals.     OBJECTIVE IMPAIRMENTS Abnormal gait, decreased activity tolerance, decreased balance, decreased cognition, decreased coordination, decreased endurance, decreased mobility, difficulty walking, decreased ROM, decreased strength, decreased safety awareness, hypomobility, impaired perceived functional ability, and pain.   ACTIVITY LIMITATIONS carrying, lifting, bending, standing, squatting, stairs, transfers, bed mobility, bathing, toileting, dressing, self feeding, hygiene/grooming, and caring for others  PARTICIPATION LIMITATIONS: meal prep, cleaning, laundry, medication management, personal finances, driving, shopping, community activity, and yard work  PERSONAL FACTORS Time since onset of  injury/illness/exacerbation are also affecting patient's functional outcome.   REHAB POTENTIAL: Fair    CLINICAL DECISION MAKING: Evolving/moderate complexity  EVALUATION COMPLEXITY: Moderate  PLAN: PT FREQUENCY: 2x/week  PT DURATION: 12 weeks  PLANNED INTERVENTIONS: Therapeutic exercises, Therapeutic activity, Neuromuscular re-education, Balance training, Gait training, Patient/Family education, Self Care, Joint mobilization, Joint manipulation, Stair training, Vestibular training, Canalith repositioning, DME instructions, Dry Needling, Electrical stimulation, Spinal manipulation, Spinal mobilization, Cryotherapy, Moist heat, Manual therapy, and Re-evaluation  PLAN FOR NEXT SESSION: Begin New HEP, work with pt regarding safety at home.    Particia Lather, PT 03/23/2022, 11:28 AM

## 2022-03-24 NOTE — Therapy (Signed)
OUTPATIENT OCCUPATIONAL THERAPY NEURO TREATMENT  Patient Name: [name] MRN: [id number] DOB:March 24, 1951, 71 y.o., male Today's Date: 03/24/2022  PCP: Dr. Elsie Stain REFERRING PROVIDER: Dr. Elsie Stain   OT End of Session - 03/24/22 0913     Visit Number 7    Number of Visits 24    Date for OT Re-Evaluation 05/23/22    OT Start Time 7672    OT Stop Time 40    OT Time Calculation (min) 45 min    Equipment Utilized During Treatment rollator    Activity Tolerance Patient tolerated treatment well    Behavior During Therapy WFL for tasks assessed/performed               Past Medical History:  Diagnosis Date   Arthritis    Bradycardia    Cancer ([place]) 2013   skin cancer   Depression    ptsd   Dysrhythmia    chronic slow heart rate   GERD (gastroesophageal reflux disease)    Headache(784.0)    tension headaches non recent   History of chicken pox    History of kidney stones    passed   Hypertension    treated with HCTZ   Pacemaker    Parkinson's disease    dx'ed 15 years ago   PTSD (post-traumatic stress disorder)    Shortness of breath dyspnea    Sleep apnea    doesn't use C-pap   Varicose veins    Past Surgical History:  Procedure Laterality Date   CHOLECYSTECTOMY N/A 10/22/2014   Procedure: LAPAROSCOPIC CHOLECYSTECTOMY WITH INTRAOPERATIVE CHOLANGIOGRAM;  Surgeon: Dia Crawford III, MD;  Location: [facility] ORS;  Service: General;  Laterality: N/A;   cyst removed      from lip as a child   INTRAMEDULLARY (IM) NAIL INTERTROCHANTERIC N/A 02/18/2019   Procedure: INTRAMEDULLARY (IM) NAIL INTERTROCHANTRIC, RIGHT,;  Surgeon: Thornton Park, MD;  Location: [facility] OR[facility];  Service: Orthopedics;  Laterality: N/A;   LUMBAR LAMINECTOMY/DECOMPRESSION MICRODISCECTOMY Bilateral 12/14/2012   Procedure: Bilateral lumbar three-four, four-five decompressive laminotomy/foraminotomy;  Surgeon: Charlie Pitter, MD;  Location: [place] [facility];  Service: Neurosurgery;  Laterality: Bilateral;    PULSE GENERATOR IMPLANT Bilateral 12/13/2013   Procedure: Bilateral implantable pulse generator placement;  Surgeon: Erline Levine, MD;  Location: [place] [facility];  Service: Neurosurgery;  Laterality: Bilateral;  Bilateral implantable pulse generator placement   skin cancer removed     from ears,   12 lft arm  rt leg 15   SUBTHALAMIC STIMULATOR BATTERY REPLACEMENT Bilateral 07/14/2017   Procedure: BILATERAL IMPLANTED PULSE GENERATOR CHANGE FOR DEEP BRAIN STIMULATOR;  Surgeon: Erline Levine, MD;  Location: [place];  Service: Neurosurgery;  Laterality: Bilateral;   SUBTHALAMIC STIMULATOR INSERTION Bilateral 12/06/2013   Procedure: SUBTHALAMIC STIMULATOR INSERTION;  Surgeon: Erline Levine, MD;  Location: [place] [facility];  Service: Neurosurgery;  Laterality: Bilateral;  Bilateral deep brain stimulator placement   Patient Active Problem List   Diagnosis Date Noted   Hematuria 10/10/2021   Sinus drainage 12/09/2020   Medicare annual wellness visit, subsequent 11/18/2020   Pacemaker    Dementia associated with Parkinson's disease ([place]) 06/17/2020   Vertigo 02/19/2020   Urine abnormality 02/19/2020   Dysphagia 02/23/2019   Fall at home 10/18/2017   REM behavioral disorder 11/02/2016   Radicular pain in right arm 10/09/2016   GERD (gastroesophageal reflux disease) 07/31/2016   Colon cancer screening 07/23/2015   Gout 02/18/2015   Depression 01/06/2015   Skin lesion 10/02/2014   Dupuytren's contracture  08/20/2014   Cough 07/21/2014   Lumbar stenosis with neurogenic claudication 06/13/2014   S/P deep brain stimulator placement 05/08/2014   Advance care planning 01/19/2014   Parkinson's disease 12/13/2013   PTSD (post-traumatic stress disorder) 06/13/2013   Erectile dysfunction 06/13/2013   HLD (hyperlipidemia) 06/13/2013   Essential hypertension 06/03/2013   Bradycardia by electrocardiogram 06/03/2013   Obstructive sleep apnea 03/12/2013    ONSET DATE: 2015  REFERRING DIAG: Parkinson's  Disease  THERAPY DIAG:  Muscle weakness (generalized)  Other lack of coordination  Parkinson's disease without dyskinesia or fluctuating manifestations  Rationale for Evaluation and Treatment: Rehabilitation  SUBJECTIVE: Pt reports doing well today.   SUBJECTIVE STATEMENT: Pt accompanied by:  caregiver [name])  PERTINENT HISTORY: Parkinson's Disease, dementia, DBS, multiple falls  PRECAUTIONS: Fall  WEIGHT BEARING RESTRICTIONS: No  PAIN:  Are you having pain? Yes: NPRS scale: 3/10 Pain location: hips and low back, R knee Pain description: sharp, tight Aggravating factors: if pt crosses feet too long/awkward positioning Relieving factors: voltaren gel   FALLS: Has patient fallen in last 6 months? Yes. Number of falls : spouse estimates 20  LIVING ENVIRONMENT: Lives with: lives with their spouse Lives in: house, 1 level Stairs: No Has following equipment at home: [place] entry, handicapped accessible bathroom  PLOF: Needs assistance with ADLs  PATIENT GOALS: increase bilat hand coordination for ADLs  OBJECTIVE:   HAND DOMINANCE: Right  ADLs: Overall ADLs: caregiver assist for all ADLs; pt currently as 16 hrs of caregiver assist per week through the [place].  Pt recently was assigned 100% disability by the [place], and spouse reports caregiver hours will increase to 46 per week, but uncertain when Transfers/ambulation related to ADLs: supv; pt uses upright 4 wheeled walker Eating: caregivers help to cut food Grooming: mod A to shave with an electric razor UB Dressing: min A, difficulty with buttons LB Dressing: mod A, difficulty with zippers and buttons Toileting: occasional accidents with bowel and bladder, but less than weekly and only during the night; distant supv for toileting during the day Bathing: min A for bathing, mod A for drying off Tub Shower transfers: min guard for shower transfers with grab bar and shower chair Equipment:  fully handicapped shower and  toilet  IADLs: Shopping: dep on caregiver Light housekeeping: dep Meal Prep: dep Community mobility: short distance community amb with supv and 4 wheeled walker Medication management: dep Financial management: dep Handwriting:  printed first/last name 50% legible, cursive first/last name illegible  MOBILITY STATUS: Hx of falls  POSTURE COMMENTS:  rounded shoulders, forward head, increased thoracic kyphosis, and flexed trunk   FUNCTIONAL OUTCOME MEASURES: FOTO: 40.2 (completed with PT); predicted 48  UPPER EXTREMITY ROM:   Pt demos BUE shoulder ROM WFL for self care  UPPER EXTREMITY MMT:     MMT Right eval Left eval  Shoulder flexion 5 5  Shoulder abduction 5 5  Shoulder adduction    Shoulder extension    Shoulder internal rotation    Shoulder external rotation    Middle trapezius    Lower trapezius    Elbow flexion 5 5  Elbow extension 5 5  Wrist flexion 5 5  Wrist extension 5 5  Wrist ulnar deviation    Wrist radial deviation    Wrist pronation    Wrist supination    (Blank rows = not tested)  HAND FUNCTION: Grip strength: Right: 61 lbs; Left: 61 lbs, Lateral pinch: Right: 20 lbs, Left: 19 lbs, and 3 point pinch: Right: 11  lbs, Left: 15 lbs  COORDINATION: Finger Nose Finger test: increased time, L side touched lower than tip, corrected with min vc 9 Hole Peg test: Right: 1 min 18 sec; Left: 41 sec  SENSATION: Proprioception: Impaired   EDEMA: none  COGNITION: Overall cognitive status: History of cognitive impairments - at baseline; Parkinson's with dementia  VISION: Wears prism glasses; hx of visual hallucinations  PRAXIS: Impaired: Motor planning; bradykinesia, R>L  TODAY'S TREATMENT:                                                                                                                              Therapeutic Activity: Practiced scooping, storing, and translatory skills with small glass [place] stones, working to move stones from palm to  fingertips in prep for discarding.  Pt required min-mod vc for technique, and max cues to sequence through steps of [place] game.  OT advised caregiver that this game would be beneficial to play at home to facilitate coordination skills, but caregiver would need to help sequence pt through the steps of the game ongoing d/t pt's cognitive level.  Caregiver verbalized understanding.  Pt also worked with [facility] game which included manipulation of 3 dice and flat chips.  Pt was cued to alternate hands intermittently throughout the game to facilitate L/R [facility] and dexterity skills to pick up and roll dice and pick up and move chips from table top.    PATIENT EDUCATION: Education details: therapeutic games to facilitate L/R hand coordination skills Person educated: Patient, Spouse, and Caregiver , [name] Education method: Explanation Education comprehension: verbalized understanding  HOME EXERCISE PROGRAM: To be established in the next 1-2 sessions   GOALS: Goals reviewed with patient? Yes  SHORT TERM GOALS: Target date: 04/11/22  1. Pt will complete HEP for increasing [place] and strength in bilat hands with direct supv at least 4 of 7 days per week. Baseline: not yet initiated Goal status: INITIAL  2. Pt will clean his glasses with min A and mod vc, including opening cleaning packet.   Baseline: max A  Goal status: initial   LONG TERM GOALS: Target date: 05/23/22  Pt will increase FOTO score by 5 or more points to indicate increase in self/caregiver perceived functional performance with daily tasks.  Baseline: 40.2 (predicted 48) Goal status: INITIAL  2.  Pt will increase R/L lateral pinch strength by 4 or more lbs to enable pt to independently turn on electric toothbrush and twist open toothpaste. Baseline: R 20, L 19; caregiver assist to turn on toothbrush and open toothpaste Goal status: INITIAL  3.  Pt will increase [place] in bilat hands to manage buttons and zippers on clothing 75% of the time.   Baseline: caregivers assist with clothing fasteners at least 50% of the time Goal status: INITIAL  4.  Pt will scoop food from plate/bowl with min vc using adapted utensils and adapted plates/bowls as needed.  Baseline: caregiver constantly turns plate to compensate for pt  pushing food to 1 side. Goal status: INITIAL  5.  Pt will shave with electric razor with min A for thoroughness. Baseline: pt struggles to grade sufficient pressure to thoroughly shave face, requiring mod A. Goal status: INITIAL  ASSESSMENT:  CLINICAL IMPRESSION: Pt seen today with focus on facilitation of L/R hand FMC/dexterity skills with selected table top games which challenged small item pick up of small stones, dice, and chips from table top, storage of items in hand, and translatory skills to move pieces from palm to fingertips in prep for discarding.  OT provided caregiver with education on recommended verbal cues necessary to assist pt with sequencing through the steps of 2 games given pt's cognitive abilities. Pt did well with picking up small pieces from table, but tends to drop items from palm during storage and translatory movements with storing several items at a time in palm.  Additional skilled OT will benefit pt to maximize hand strength and coordination for daily tasks, and provide strategies to maximize indep with ADLs and leisure activities to increase QOL.  PERFORMANCE DEFICITS: in functional skills including ADLs, IADLs, coordination, dexterity, proprioception, strength, pain, Fine motor control, Gross motor control, mobility, balance, body mechanics, endurance, decreased knowledge of use of DME, and UE functional use, cognitive skills including attention, learn, memory, orientation, problem solving, safety awareness, thought, and understand. IMPAIRMENTS: are limiting patient from ADLs, IADLs, rest and sleep, leisure, and social participation.   CO-MORBIDITIES: has co-morbidities such as dementia  that  affects occupational performance. Patient will benefit from skilled OT to address above impairments and improve overall function.  MODIFICATION OR ASSISTANCE TO COMPLETE EVALUATION: Min-Moderate modification of tasks or assist with assess necessary to complete an evaluation.  OT OCCUPATIONAL PROFILE AND HISTORY: Problem focused assessment: Including review of records relating to presenting problem.  CLINICAL DECISION MAKING: Moderate - several treatment options, min-mod task modification necessary  REHAB POTENTIAL: Good  EVALUATION COMPLEXITY: Moderate    PLAN:  OT FREQUENCY: 2x/week  OT DURATION: 12 weeks  PLANNED INTERVENTIONS: self care/ADL training, therapeutic exercise, therapeutic activity, neuromuscular re-education, manual therapy, balance training, functional mobility training, moist heat, cryotherapy, patient/family education, cognitive remediation/compensation, energy conservation, coping strategies training, and DME and/or AE instructions  RECOMMENDED OTHER SERVICES: N/A  CONSULTED AND AGREED WITH PLAN OF CARE: Patient and family member/caregiver  PLAN FOR NEXT SESSION: see eval  Leta Speller, MS, OTR/L  Darleene Cleaver, OT 03/24/2022, 9:16 AM

## 2022-03-28 ENCOUNTER — Ambulatory Visit: Payer: Medicare PPO

## 2022-03-28 ENCOUNTER — Encounter: Payer: Self-pay | Admitting: Physical Therapy

## 2022-03-28 ENCOUNTER — Ambulatory Visit: Payer: Medicare PPO | Admitting: Physical Therapy

## 2022-03-28 DIAGNOSIS — R278 Other lack of coordination: Secondary | ICD-10-CM | POA: Diagnosis not present

## 2022-03-28 DIAGNOSIS — G20A1 Parkinson's disease without dyskinesia, without mention of fluctuations: Secondary | ICD-10-CM | POA: Diagnosis not present

## 2022-03-28 DIAGNOSIS — R262 Difficulty in walking, not elsewhere classified: Secondary | ICD-10-CM

## 2022-03-28 DIAGNOSIS — R296 Repeated falls: Secondary | ICD-10-CM

## 2022-03-28 DIAGNOSIS — R2681 Unsteadiness on feet: Secondary | ICD-10-CM

## 2022-03-28 DIAGNOSIS — R269 Unspecified abnormalities of gait and mobility: Secondary | ICD-10-CM | POA: Diagnosis not present

## 2022-03-28 DIAGNOSIS — M6281 Muscle weakness (generalized): Secondary | ICD-10-CM | POA: Diagnosis not present

## 2022-03-28 NOTE — Therapy (Signed)
OUTPATIENT PHYSICAL THERAPY NEURO Treatment   Patient Name: [name] MRN: [id number] DOB:1950/06/18, 71 y.o., male Today's Date: 03/28/2022   PCP: Dr. Elsie Stain REFERRING PROVIDER: Dr. Sharolyn Douglas   PT End of Session - 03/28/22 1104     Visit Number 8    Number of Visits 24    Date for PT Re-Evaluation 05/23/22    Authorization Type [employer] Choice PPO    Progress Note Due on Visit 10    PT Start Time 1100    PT Stop Time 1143    PT Time Calculation (min) 43 min    Equipment Utilized During Treatment Gait belt    Activity Tolerance Patient tolerated treatment well    Behavior During Therapy WFL for tasks assessed/performed                   Past Medical History:  Diagnosis Date   Arthritis    Bradycardia    Cancer ([place]) 2013   skin cancer   Depression    ptsd   Dysrhythmia    chronic slow heart rate   GERD (gastroesophageal reflux disease)    Headache(784.0)    tension headaches non recent   History of chicken pox    History of kidney stones    passed   Hypertension    treated with HCTZ   Pacemaker    Parkinson's disease    dx'ed 15 years ago   PTSD (post-traumatic stress disorder)    Shortness of breath dyspnea    Sleep apnea    doesn't use C-pap   Varicose veins    Past Surgical History:  Procedure Laterality Date   CHOLECYSTECTOMY N/A 10/22/2014   Procedure: LAPAROSCOPIC CHOLECYSTECTOMY WITH INTRAOPERATIVE CHOLANGIOGRAM;  Surgeon: Dia Crawford III, MD;  Location: [facility] ORS;  Service: General;  Laterality: N/A;   cyst removed      from lip as a child   INTRAMEDULLARY (IM) NAIL INTERTROCHANTERIC N/A 02/18/2019   Procedure: INTRAMEDULLARY (IM) NAIL INTERTROCHANTRIC, RIGHT,;  Surgeon: Thornton Park, MD;  Location: [facility] OR[facility];  Service: Orthopedics;  Laterality: N/A;   LUMBAR LAMINECTOMY/DECOMPRESSION MICRODISCECTOMY Bilateral 12/14/2012   Procedure: Bilateral lumbar three-four, four-five decompressive laminotomy/foraminotomy;   Surgeon: Charlie Pitter, MD;  Location: [place] [facility];  Service: Neurosurgery;  Laterality: Bilateral;   PULSE GENERATOR IMPLANT Bilateral 12/13/2013   Procedure: Bilateral implantable pulse generator placement;  Surgeon: Erline Levine, MD;  Location: [place] [facility];  Service: Neurosurgery;  Laterality: Bilateral;  Bilateral implantable pulse generator placement   skin cancer removed     from ears,   12 lft arm  rt leg 15   SUBTHALAMIC STIMULATOR BATTERY REPLACEMENT Bilateral 07/14/2017   Procedure: BILATERAL IMPLANTED PULSE GENERATOR CHANGE FOR DEEP BRAIN STIMULATOR;  Surgeon: Erline Levine, MD;  Location: [facility];  Service: Neurosurgery;  Laterality: Bilateral;   SUBTHALAMIC STIMULATOR INSERTION Bilateral 12/06/2013   Procedure: SUBTHALAMIC STIMULATOR INSERTION;  Surgeon: Erline Levine, MD;  Location: [place] [facility];  Service: Neurosurgery;  Laterality: Bilateral;  Bilateral deep brain stimulator placement   Patient Active Problem List   Diagnosis Date Noted   Hematuria 10/10/2021   Sinus drainage 12/09/2020   Medicare annual wellness visit, subsequent 11/18/2020   Pacemaker    Dementia associated with Parkinson's disease ([place]) 06/17/2020   Vertigo 02/19/2020   Urine abnormality 02/19/2020   Dysphagia 02/23/2019   Fall at home 10/18/2017   REM behavioral disorder 11/02/2016   Radicular pain in right arm 10/09/2016   GERD (gastroesophageal reflux  disease) 07/31/2016   Colon cancer screening 07/23/2015   Gout 02/18/2015   Depression 01/06/2015   Skin lesion 10/02/2014   Dupuytren's contracture 08/20/2014   Cough 07/21/2014   Lumbar stenosis with neurogenic claudication 06/13/2014   S/P deep brain stimulator placement 05/08/2014   Advance care planning 01/19/2014   Parkinson's disease 12/13/2013   PTSD (post-traumatic stress disorder) 06/13/2013   Erectile dysfunction 06/13/2013   HLD (hyperlipidemia) 06/13/2013   Essential hypertension 06/03/2013   Bradycardia by electrocardiogram 06/03/2013    Obstructive sleep apnea 03/12/2013    ONSET DATE: 15 years ago per chart  REFERRING DIAG: Parkinson's disease  THERAPY DIAG:  Difficulty in walking, not elsewhere classified  Unsteadiness on feet  Repeated falls  Parkinson's disease without dyskinesia or fluctuating manifestations  Rationale for Evaluation and Treatment Rehabilitation  SUBJECTIVE:                                                                                                                                                                                              SUBJECTIVE STATEMENT: Patient reports 1 fall since last session when he was arranging items on a bookcase and try to step over several books and lost his balance.  Patient did indicate he felt this is a challenging balance task with and prevent him from attempting it.  Patient encouraged to modify activity in future. Pt accompanied by: significant other  PERTINENT HISTORY:   Date of most recent Admission: 09/25/2021 Date of Discharge: 09/28/2021  Discharge Diagnoses:  Altered mental status Parkinson disease (CMS/HCC) Hallucinations  Per [facility] discharge summary notes PHI : Hospital Course:   71 y.o. male with a history of Parkinson's Disease w/ DBS in place, PD-related dementia, OSA, depression, and seizure disorder who presented for EEG given concern for seizures.  Altered mental status Concern seizure activity Periods of unresponsiveness on the setting of new medication--Nuplazid (pimavanserin) recently started. No events of note on continuous EEG. This was held and he returned to his baseline mental status. Other workup including CT head, CTA h/n without obvious cause for periodic unresponsiveness. Nuplazid was discontinued.   Aspiration, weakness Of note, he had a coughing spell while eating though sounds like was precipitated by sneezing. Did well on RA, seen by SLP without any further needs or concerns.  Parkinson's  Disease Parkinson's-related dementia - He follows in the [facility] Disorders center at [facility]. Clinic notes suggest he seems to have worsening of his overall disease trajectory. - Continued home Memantine and Sinemet      PMH: HTN, bradycardia s/p PPM, PD, DBS, COVID-19 infection, chronic low back pain s/p L4/5 surgery.  PAIN:  Are you having pain? Yes: NPRS scale: 7/10 Pain location: Low back Pain description: ache Aggravating factors: Prolonged standing/sitting, bending over Relieving factors: rest, changing positions  PRECAUTIONS: Fall  WEIGHT BEARING RESTRICTIONS No  FALLS: Has patient fallen in last 6 months? Yes. Number of falls >10  LIVING ENVIRONMENT: Lives with: lives with their spouse Lives in: House/apartment Stairs:  Ramp Has following equipment at home: Single point cane, Environmental consultant - 4 wheeled, Wheelchair (manual), shower chair, bed side commode, Grab bars, and Ramped entry  PLOF: Needs assistance with ADLs, Needs assistance with homemaking, Needs assistance with gait, and Needs assistance with transfers  PATIENT GOALS To walk better and not fall. Improve fall frequency to prevent significant injury   OBJECTIVE: (objective measures completed at initial evaluation unless otherwise dated)   DIAGNOSTIC FINDINGS:   COGNITION: Overall cognitive status: History of cognitive impairments - at baseline   SENSATION: Light touch: WFL  COORDINATION: Decreased and slow to initiate  EDEMA:  None observed     POSTURE: rounded shoulders, forward head, increased thoracic kyphosis, and flexed trunk      TRANSFERS: Assistive device utilized:  Upright 4WW   Sit to stand:  Min/mod assist- with increased VC for safety Stand to sit:  Min assist with Verbal cues to lock brakes and reach back Chair to chair: Min A     GAIT: Gait pattern: decreased arm swing- Right, decreased arm swing- Left, decreased step length- Right, decreased step length- Left, decreased stance  time- Right, decreased stance time- Left, decreased stride length, and shuffling Distance walked: 150 ft. Assistive device utilized:  upright 4WW Level of assistance: Min A Comments: Shuffling gait with slow gait speed  FUNCTIONAL TESTs:  5 times sit to stand: 19.5 sec with UE support with BUE support Timed up and go (TUG): 37.26 sec with upright 4WW 6 minute walk test: 720 feet using upright 4WW 10 meter walk test: 0.67 m/s  PATIENT SURVEYS:  FOTO 50  TODAY'S TREATMENT:  03/28/22     Obstacle course using random stations with blaze pods for intervention. 3 min blaze pod home base with 4 stations and home base as chair working on safe transfers and AD use.  1 round x 3 min 2 rounds x 6 min  - noticed with steps pt had hip ABD weakness limiting his ability to safely use wider BOS     LAQ 4# AW with hip ABD/ADD in seated  xx 10 ea LE        Seated Hip ABD Blue TB 20 x 5 sec holds, cues for hold times.     Unless otherwise stated, CGA was provided and gait belt donned in order to ensure pt safety    PATIENT EDUCATION: Education details: PT plan of care; purpose of functional outcomes Person educated: Patient and Spouse Education method: Explanation and Verbal cues Education comprehension: verbalized understanding   HOME EXERCISE PROGRAM: To be reviewed and progressed next 1-2 visits    GOALS: Goals reviewed with patient? Yes  SHORT TERM GOALS: Target date: 01/11/2022  Pt will be independent with HEP in order to improve strength and balance in order to decrease fall risk and improve function at home and work.  Baseline: Patient reports not performing much in the way of any HEP except walking. Goal status: INITIAL  LONG TERM GOALS: Target date: 02/22/2022  Pt will decrease 5TSTS by at least 5 seconds in order to demonstrate clinically significant improvement in LE strength. Baseline: 02/28/2022: 19.5 sec  with UE support   Goal status: INITIAL  2.  Pt will improve  FOTO to target score of 48 to display perceived improvements in ability to complete ADL's.  Baseline: 02/28/22=40 Goal status: INITIAL  3.  Pt will decrease TUG to < or = to 25 seconds/decrease in order to demonstrate decreased fall risk. Baseline: 02/28/22= 37.26 sec using upright 4WW Goal status: INITIAL  4.  Pt will increase 10MWT by at least 0.13 m/s in order to demonstrate clinically significant improvement in community ambulation.   Baseline: 02/28/22= 0.67 m/s using upright 4WW Goal status: INITIAL  5.  Patient will increase six minute walk test distance to >1000 for progression to community ambulator and improve gait ability  Baseline: 02/28/22: 720 feet with up walker  Goal status: INITIAL  6.  Patient will experience 1 fall or less per 2 week period for at least 3 weeks in order to improve his frequency of falls and prevent injury from falls  Baseline: 02/28/22: pt falls approximately once every 10 days  Goal status: INITIAL  ASSESSMENT:  CLINICAL IMPRESSION: Patient presents with good motivation for completion of physical therapy activities.  Continued with balance obstacle course with addition of dual task challenge using blaze pods for interventions as noted.  Patient still requires occasional cues for safety with sit to stand transitions as well as with proper use of walker.  Pt will continue to benefit from skilled physical therapy intervention to address impairments, improve QOL, and attain therapy goals.     OBJECTIVE IMPAIRMENTS Abnormal gait, decreased activity tolerance, decreased balance, decreased cognition, decreased coordination, decreased endurance, decreased mobility, difficulty walking, decreased ROM, decreased strength, decreased safety awareness, hypomobility, impaired perceived functional ability, and pain.   ACTIVITY LIMITATIONS carrying, lifting, bending, standing, squatting, stairs, transfers, bed mobility, bathing, toileting, dressing, self feeding,  hygiene/grooming, and caring for others  PARTICIPATION LIMITATIONS: meal prep, cleaning, laundry, medication management, personal finances, driving, shopping, community activity, and yard work  PERSONAL FACTORS Time since onset of injury/illness/exacerbation are also affecting patient's functional outcome.   REHAB POTENTIAL: Fair    CLINICAL DECISION MAKING: Evolving/moderate complexity  EVALUATION COMPLEXITY: Moderate  PLAN: PT FREQUENCY: 2x/week  PT DURATION: 12 weeks  PLANNED INTERVENTIONS: Therapeutic exercises, Therapeutic activity, Neuromuscular re-education, Balance training, Gait training, Patient/Family education, Self Care, Joint mobilization, Joint manipulation, Stair training, Vestibular training, Canalith repositioning, DME instructions, Dry Needling, Electrical stimulation, Spinal manipulation, Spinal mobilization, Cryotherapy, Moist heat, Manual therapy, and Re-evaluation  PLAN FOR NEXT SESSION: Begin New HEP, work with pt regarding safety at home.    Particia Lather, PT 03/28/2022, 2:54 PM       02

## 2022-03-28 NOTE — Therapy (Signed)
OUTPATIENT OCCUPATIONAL THERAPY NEURO TREATMENT  Patient Name: [name] MRN: [id number] DOB:July 04, 1950, 71 y.o., male Today's Date: 03/24/2022  PCP: Dr. Elsie Stain REFERRING PROVIDER: Dr. Elsie Stain   OT End of Session - 03/24/22 0913     Visit Number 7    Number of Visits 24    Date for OT Re-Evaluation 05/23/22    OT Start Time 9390    OT Stop Time 6    OT Time Calculation (min) 45 min    Equipment Utilized During Treatment rollator    Activity Tolerance Patient tolerated treatment well    Behavior During Therapy WFL for tasks assessed/performed               Past Medical History:  Diagnosis Date   Arthritis    Bradycardia    Cancer ([place]) 2013   skin cancer   Depression    ptsd   Dysrhythmia    chronic slow heart rate   GERD (gastroesophageal reflux disease)    Headache(784.0)    tension headaches non recent   History of chicken pox    History of kidney stones    passed   Hypertension    treated with HCTZ   Pacemaker    Parkinson's disease    dx'ed 15 years ago   PTSD (post-traumatic stress disorder)    Shortness of breath dyspnea    Sleep apnea    doesn't use C-pap   Varicose veins    Past Surgical History:  Procedure Laterality Date   CHOLECYSTECTOMY N/A 10/22/2014   Procedure: LAPAROSCOPIC CHOLECYSTECTOMY WITH INTRAOPERATIVE CHOLANGIOGRAM;  Surgeon: Dia Crawford III, MD;  Location: [facility] ORS;  Service: General;  Laterality: N/A;   cyst removed      from lip as a child   INTRAMEDULLARY (IM) NAIL INTERTROCHANTERIC N/A 02/18/2019   Procedure: INTRAMEDULLARY (IM) NAIL INTERTROCHANTRIC, RIGHT,;  Surgeon: Thornton Park, MD;  Location: [facility] ORS;  Service: Orthopedics;  Laterality: N/A;   LUMBAR LAMINECTOMY/DECOMPRESSION MICRODISCECTOMY Bilateral 12/14/2012   Procedure: Bilateral lumbar three-four, four-five decompressive laminotomy/foraminotomy;  Surgeon: Charlie Pitter, MD;  Location: [place] [facility];  Service: Neurosurgery;  Laterality: Bilateral;    PULSE GENERATOR IMPLANT Bilateral 12/13/2013   Procedure: Bilateral implantable pulse generator placement;  Surgeon: Erline Levine, MD;  Location: [place] [facility];  Service: Neurosurgery;  Laterality: Bilateral;  Bilateral implantable pulse generator placement   skin cancer removed     from ears,   12 lft arm  rt leg 15   SUBTHALAMIC STIMULATOR BATTERY REPLACEMENT Bilateral 07/14/2017   Procedure: BILATERAL IMPLANTED PULSE GENERATOR CHANGE FOR DEEP BRAIN STIMULATOR;  Surgeon: Erline Levine, MD;  Location: [place];  Service: Neurosurgery;  Laterality: Bilateral;   SUBTHALAMIC STIMULATOR INSERTION Bilateral 12/06/2013   Procedure: SUBTHALAMIC STIMULATOR INSERTION;  Surgeon: Erline Levine, MD;  Location: [place] [facility]UR[facility];  Service: Neurosurgery;  Laterality: Bilateral;  Bilateral deep brain stimulator placement   Patient Active Problem List   Diagnosis Date Noted   Hematuria 10/10/2021   Sinus drainage 12/09/2020   Medicare annual wellness visit, subsequent 11/18/2020   Pacemaker    Dementia associated with Parkinson's disease ([place]) 06/17/2020   Vertigo 02/19/2020   Urine abnormality 02/19/2020   Dysphagia 02/23/2019   Fall at home 10/18/2017   REM behavioral disorder 11/02/2016   Radicular pain in right arm 10/09/2016   GERD (gastroesophageal reflux disease) 07/31/2016   Colon cancer screening 07/23/2015   Gout 02/18/2015   Depression 01/06/2015   Skin lesion 10/02/2014   Dupuytren's contracture  08/20/2014   Cough 07/21/2014   Lumbar stenosis with neurogenic claudication 06/13/2014   S/P deep brain stimulator placement 05/08/2014   Advance care planning 01/19/2014   Parkinson's disease 12/13/2013   PTSD (post-traumatic stress disorder) 06/13/2013   Erectile dysfunction 06/13/2013   HLD (hyperlipidemia) 06/13/2013   Essential hypertension 06/03/2013   Bradycardia by electrocardiogram 06/03/2013   Obstructive sleep apnea 03/12/2013    ONSET DATE: 2015  REFERRING DIAG: Parkinson's  Disease  THERAPY DIAG:  Muscle weakness (generalized)  Other lack of coordination  Parkinson's disease without dyskinesia or fluctuating manifestations  Rationale for Evaluation and Treatment: Rehabilitation  SUBJECTIVE: Pt reports having a fall yesterday into his L shoulder.   SUBJECTIVE STATEMENT: Pt accompanied by:  caregiver [name])  PERTINENT HISTORY: Parkinson's Disease, dementia, DBS, multiple falls  PRECAUTIONS: Fall  WEIGHT BEARING RESTRICTIONS: No  PAIN:  Are you having pain? Yes: NPRS scale: 5/10 Pain location: L shoulder Pain description: tender Aggravating factors: flexion Relieving factors: voltaren gel   FALLS: Has patient fallen in last 6 months? Yes. Number of falls : spouse estimates 20  LIVING ENVIRONMENT: Lives with: lives with their spouse Lives in: house, 1 level Stairs: No Has following equipment at home: [place] entry, handicapped accessible bathroom  PLOF: Needs assistance with ADLs  PATIENT GOALS: increase bilat hand coordination for ADLs  OBJECTIVE:   HAND DOMINANCE: Right  ADLs: Overall ADLs: caregiver assist for all ADLs; pt currently as 16 hrs of caregiver assist per week through the [place].  Pt recently was assigned 100% disability by the [place], and spouse reports caregiver hours will increase to 46 per week, but uncertain when Transfers/ambulation related to ADLs: supv; pt uses upright 4 wheeled walker Eating: caregivers help to cut food Grooming: mod A to shave with an electric razor UB Dressing: min A, difficulty with buttons LB Dressing: mod A, difficulty with zippers and buttons Toileting: occasional accidents with bowel and bladder, but less than weekly and only during the night; distant supv for toileting during the day Bathing: min A for bathing, mod A for drying off Tub Shower transfers: min guard for shower transfers with grab bar and shower chair Equipment:  fully handicapped shower and toilet  IADLs: Shopping: dep on  caregiver Light housekeeping: dep Meal Prep: dep Community mobility: short distance community amb with supv and 4 wheeled walker Medication management: dep Financial management: dep Handwriting:  printed first/last name 50% legible, cursive first/last name illegible  MOBILITY STATUS: Hx of falls  POSTURE COMMENTS:  rounded shoulders, forward head, increased thoracic kyphosis, and flexed trunk   FUNCTIONAL OUTCOME MEASURES: FOTO: 40.2 (completed with PT); predicted 48  UPPER EXTREMITY ROM:   Pt demos BUE shoulder ROM WFL for self care  UPPER EXTREMITY MMT:     MMT Right eval Left eval  Shoulder flexion 5 5  Shoulder abduction 5 5  Shoulder adduction    Shoulder extension    Shoulder internal rotation    Shoulder external rotation    Middle trapezius    Lower trapezius    Elbow flexion 5 5  Elbow extension 5 5  Wrist flexion 5 5  Wrist extension 5 5  Wrist ulnar deviation    Wrist radial deviation    Wrist pronation    Wrist supination    (Blank rows = not tested)  HAND FUNCTION: Grip strength: Right: 61 lbs; Left: 61 lbs, Lateral pinch: Right: 20 lbs, Left: 19 lbs, and 3 point pinch: Right: 11 lbs, Left: 15 lbs  COORDINATION:  Finger Nose Finger test: increased time, L side touched lower than tip, corrected with min vc 9 Hole Peg test: Right: 1 min 18 sec; Left: 41 sec  SENSATION: Proprioception: Impaired   EDEMA: none  COGNITION: Overall cognitive status: History of cognitive impairments - at baseline; Parkinson's with dementia  VISION: Wears prism glasses; hx of visual hallucinations  PRAXIS: Impaired: Motor planning; bradykinesia, R>L  TODAY'S TREATMENT:                                                                                                                              Therapeutic Activity:  Pt worked with [employer] game manipulating 3 dice and flat chips. MIN cues for sequencing game. Difficulty placing dice in palm of next player, notably  overshoots palm. Improved dexterity to pick up chips from flat surface.  Pt focused on improving [facility] with manipulating nuts and bolts positioned vertically. Pt unscrewed the 1", 1/2" , and 1/4" nuts, however was unable to remove the nuts without dropping them. Increased difficulty grasping and threading the nuts into position and alignment with vision occluded.   Therapeutic Exercise Pt performed gross gripping with a gross grip strengthener using white spring on 3rd hole (17.9#). Pt worked on sustaining grip while grasping pegs from table and placing into peg board, achieved all heights of colours, unable to remove black pegs from board. Returned pegs while focused on maintaining grasp and reaching to place into container at shoulder height.     PATIENT EDUCATION: Education details: therapeutic games to facilitate L/R hand coordination skills Person educated: Patient, Spouse, and Caregiver , [name] Education method: Explanation Education comprehension: verbalized understanding  HOME EXERCISE PROGRAM: To be established in the next 1-2 sessions   GOALS: Goals reviewed with patient? Yes  SHORT TERM GOALS: Target date: 04/11/22  1. Pt will complete HEP for increasing [place] and strength in bilat hands with direct supv at least 4 of 7 days per week. Baseline: not yet initiated Goal status: INITIAL  2. Pt will clean his glasses with min A and mod vc, including opening cleaning packet.   Baseline: max A  Goal status: initial   LONG TERM GOALS: Target date: 05/23/22  Pt will increase FOTO score by 5 or more points to indicate increase in self/caregiver perceived functional performance with daily tasks.  Baseline: 40.2 (predicted 48) Goal status: INITIAL  2.  Pt will increase R/L lateral pinch strength by 4 or more lbs to enable pt to independently turn on electric toothbrush and twist open toothpaste. Baseline: R 20, L 19; caregiver assist to turn on toothbrush and open toothpaste Goal  status: INITIAL  3.  Pt will increase [place] in bilat hands to manage buttons and zippers on clothing 75% of the time.  Baseline: caregivers assist with clothing fasteners at least 50% of the time Goal status: INITIAL  4.  Pt will scoop food from plate/bowl with min vc using adapted utensils and adapted plates/bowls as  needed.  Baseline: caregiver constantly turns plate to compensate for pt pushing food to 1 side. Goal status: INITIAL  5.  Pt will shave with electric razor with min A for thoroughness. Baseline: pt struggles to grade sufficient pressure to thoroughly shave face, requiring mod A. Goal status: INITIAL  ASSESSMENT:  CLINICAL IMPRESSION: Pt reports 5/10 L shoulder pain from fall this weekend attempting to step over objects. Pt seen today with focus on facilitation of L/R hand FMC/dexterity skills with dice game - emphasis on translatory skills and picking flat discs up from table. Completed threading tasks for nuts/bolts with vision occluded, noted increased difficulty as pt fatigues. Tolerated 17.9# grip strengthener for sustained gripping at table and shoulder heights. Additional skilled OT will benefit pt to maximize hand strength and coordination for daily tasks, and provide strategies to maximize indep with ADLs and leisure activities to increase QOL.  PERFORMANCE DEFICITS: in functional skills including ADLs, IADLs, coordination, dexterity, proprioception, strength, pain, Fine motor control, Gross motor control, mobility, balance, body mechanics, endurance, decreased knowledge of use of DME, and UE functional use, cognitive skills including attention, learn, memory, orientation, problem solving, safety awareness, thought, and understand. IMPAIRMENTS: are limiting patient from ADLs, IADLs, rest and sleep, leisure, and social participation.   CO-MORBIDITIES: has co-morbidities such as dementia  that affects occupational performance. Patient will benefit from skilled OT to address  above impairments and improve overall function.  MODIFICATION OR ASSISTANCE TO COMPLETE EVALUATION: Min-Moderate modification of tasks or assist with assess necessary to complete an evaluation.  OT OCCUPATIONAL PROFILE AND HISTORY: Problem focused assessment: Including review of records relating to presenting problem.  CLINICAL DECISION MAKING: Moderate - several treatment options, min-mod task modification necessary  REHAB POTENTIAL: Good  EVALUATION COMPLEXITY: Moderate    PLAN:  OT FREQUENCY: 2x/week  OT DURATION: 12 weeks  PLANNED INTERVENTIONS: self care/ADL training, therapeutic exercise, therapeutic activity, neuromuscular re-education, manual therapy, balance training, functional mobility training, moist heat, cryotherapy, patient/family education, cognitive remediation/compensation, energy conservation, coping strategies training, and DME and/or AE instructions  RECOMMENDED OTHER SERVICES: N/A  CONSULTED AND AGREED WITH PLAN OF CARE: Patient and family member/caregiver  PLAN FOR NEXT SESSION: see eval  Dessie Coma, M.S. OTR/L  03/28/22, 11:44 AM  ascom [phone]

## 2022-03-30 ENCOUNTER — Ambulatory Visit: Payer: Medicare PPO | Admitting: Physical Therapy

## 2022-03-30 ENCOUNTER — Ambulatory Visit: Payer: Medicare PPO

## 2022-03-30 NOTE — Progress Notes (Signed)
Remote pacemaker transmission.   

## 2022-04-04 ENCOUNTER — Ambulatory Visit: Payer: Medicare PPO | Admitting: Physical Therapy

## 2022-04-04 ENCOUNTER — Ambulatory Visit: Payer: Medicare PPO

## 2022-04-04 DIAGNOSIS — R278 Other lack of coordination: Secondary | ICD-10-CM | POA: Diagnosis not present

## 2022-04-04 DIAGNOSIS — R296 Repeated falls: Secondary | ICD-10-CM | POA: Diagnosis not present

## 2022-04-04 DIAGNOSIS — R2681 Unsteadiness on feet: Secondary | ICD-10-CM | POA: Diagnosis not present

## 2022-04-04 DIAGNOSIS — G20A1 Parkinson's disease without dyskinesia, without mention of fluctuations: Secondary | ICD-10-CM

## 2022-04-04 DIAGNOSIS — M6281 Muscle weakness (generalized): Secondary | ICD-10-CM

## 2022-04-04 DIAGNOSIS — R269 Unspecified abnormalities of gait and mobility: Secondary | ICD-10-CM

## 2022-04-04 DIAGNOSIS — R262 Difficulty in walking, not elsewhere classified: Secondary | ICD-10-CM | POA: Diagnosis not present

## 2022-04-04 NOTE — Therapy (Signed)
OUTPATIENT OCCUPATIONAL THERAPY NEURO TREATMENT  Patient Name: [name] MRN: [id number] DOB:[birth year], 71 y.o., male Today's Date: 04/04/2022  PCP: Dr. Elsie Stain REFERRING PROVIDER: Dr. Elsie Stain   OT End of Session - 04/04/22 1520     Visit Number 9    Number of Visits 24    Date for OT Re-Evaluation 05/23/22    OT Start Time 0932    OT Stop Time 1230    OT Time Calculation (min) 45 min    Equipment Utilized During Treatment rollator    Activity Tolerance Patient tolerated treatment well    Behavior During Therapy WFL for tasks assessed/performed               Past Medical History:  Diagnosis Date   Arthritis    Bradycardia    Cancer ([place]) 2013   skin cancer   Depression    ptsd   Dysrhythmia    chronic slow heart rate   GERD (gastroesophageal reflux disease)    Headache(784.0)    tension headaches non recent   History of chicken pox    History of kidney stones    passed   Hypertension    treated with HCTZ   Pacemaker    Parkinson's disease    dx'ed 15 years ago   PTSD (post-traumatic stress disorder)    Shortness of breath dyspnea    Sleep apnea    doesn't use C-pap   Varicose veins    Past Surgical History:  Procedure Laterality Date   CHOLECYSTECTOMY N/A 10/22/2014   Procedure: LAPAROSCOPIC CHOLECYSTECTOMY WITH INTRAOPERATIVE CHOLANGIOGRAM;  Surgeon: Dia Crawford III, MD;  Location: [facility] ORS;  Service: General;  Laterality: N/A;   cyst removed      from lip as a child   INTRAMEDULLARY (IM) NAIL INTERTROCHANTERIC N/A 02/18/2019   Procedure: INTRAMEDULLARY (IM) NAIL INTERTROCHANTRIC, RIGHT,;  Surgeon: Thornton Park, MD;  Location: [facility] OR[facility];  Service: Orthopedics;  Laterality: N/A;   LUMBAR LAMINECTOMY/DECOMPRESSION MICRODISCECTOMY Bilateral 12/14/2012   Procedure: Bilateral lumbar three-four, four-five decompressive laminotomy/foraminotomy;  Surgeon: Charlie Pitter, MD;  Location: [place] [facility]UR[facility];  Service: Neurosurgery;  Laterality: Bilateral;    PULSE GENERATOR IMPLANT Bilateral 12/13/2013   Procedure: Bilateral implantable pulse generator placement;  Surgeon: Erline Levine, MD;  Location: [place] [facility];  Service: Neurosurgery;  Laterality: Bilateral;  Bilateral implantable pulse generator placement   skin cancer removed     from ears,   12 lft arm  rt leg 15   SUBTHALAMIC STIMULATOR BATTERY REPLACEMENT Bilateral 07/14/2017   Procedure: BILATERAL IMPLANTED PULSE GENERATOR CHANGE FOR DEEP BRAIN STIMULATOR;  Surgeon: Erline Levine, MD;  Location: [place];  Service: Neurosurgery;  Laterality: Bilateral;   SUBTHALAMIC STIMULATOR INSERTION Bilateral 12/06/2013   Procedure: SUBTHALAMIC STIMULATOR INSERTION;  Surgeon: Erline Levine, MD;  Location: [place] [facility]UR[facility];  Service: Neurosurgery;  Laterality: Bilateral;  Bilateral deep brain stimulator placement   Patient Active Problem List   Diagnosis Date Noted   Hematuria 10/10/2021   Sinus drainage 12/09/2020   Medicare annual wellness visit, subsequent 11/18/2020   Pacemaker    Dementia associated with Parkinson's disease ([place]) 06/17/2020   Vertigo 02/19/2020   Urine abnormality 02/19/2020   Dysphagia 02/23/2019   Fall at home 10/18/2017   REM behavioral disorder 11/02/2016   Radicular pain in right arm 10/09/2016   GERD (gastroesophageal reflux disease) 07/31/2016   Colon cancer screening 07/23/2015   Gout 02/18/2015   Depression 01/06/2015   Skin lesion 10/02/2014   Dupuytren's contracture  08/20/2014   Cough 07/21/2014   Lumbar stenosis with neurogenic claudication 06/13/2014   S/P deep brain stimulator placement 05/08/2014   Advance care planning 01/19/2014   Parkinson's disease 12/13/2013   PTSD (post-traumatic stress disorder) 06/13/2013   Erectile dysfunction 06/13/2013   HLD (hyperlipidemia) 06/13/2013   Essential hypertension 06/03/2013   Bradycardia by electrocardiogram 06/03/2013   Obstructive sleep apnea 03/12/2013    ONSET DATE: 2015  REFERRING DIAG: Parkinson's  Disease  THERAPY DIAG:  Muscle weakness (generalized)  Other lack of coordination  Parkinson's disease without dyskinesia or fluctuating manifestations  Rationale for Evaluation and Treatment: Rehabilitation  SUBJECTIVE: Pt reports doing well today. Pt states he had a good weekend and went to [facility] yesterday, but otherwise stayed home.  SUBJECTIVE STATEMENT: Pt accompanied by:  caregiver [name])  PERTINENT HISTORY: Parkinson's Disease, dementia, DBS, multiple falls  PRECAUTIONS: Fall  WEIGHT BEARING RESTRICTIONS: No  PAIN:  Are you having pain? Yes: NPRS scale: 3/10 Pain location: hips and low back, R knee Pain description: sharp, tight Aggravating factors: if pt crosses feet too long/awkward positioning Relieving factors: voltaren gel   FALLS: Has patient fallen in last 6 months? Yes. Number of falls : spouse estimates 20  LIVING ENVIRONMENT: Lives with: lives with their spouse Lives in: house, 1 level Stairs: No Has following equipment at home: [place] entry, handicapped accessible bathroom  PLOF: Needs assistance with ADLs  PATIENT GOALS: increase bilat hand coordination for ADLs  OBJECTIVE:   HAND DOMINANCE: Right  ADLs: Overall ADLs: caregiver assist for all ADLs; pt currently as 16 hrs of caregiver assist per week through the [place].  Pt recently was assigned 100% disability by the [place], and spouse reports caregiver hours will increase to 46 per week, but uncertain when Transfers/ambulation related to ADLs: supv; pt uses upright 4 wheeled walker Eating: caregivers help to cut food Grooming: mod A to shave with an electric razor UB Dressing: min A, difficulty with buttons LB Dressing: mod A, difficulty with zippers and buttons Toileting: occasional accidents with bowel and bladder, but less than weekly and only during the night; distant supv for toileting during the day Bathing: min A for bathing, mod A for drying off Tub Shower transfers: min guard for shower  transfers with grab bar and shower chair Equipment:  fully handicapped shower and toilet  IADLs: Shopping: dep on caregiver Light housekeeping: dep Meal Prep: dep Community mobility: short distance community amb with supv and 4 wheeled walker Medication management: dep Financial management: dep Handwriting:  printed first/last name 50% legible, cursive first/last name illegible  MOBILITY STATUS: Hx of falls  POSTURE COMMENTS:  rounded shoulders, forward head, increased thoracic kyphosis, and flexed trunk   FUNCTIONAL OUTCOME MEASURES: FOTO: 40.2 (completed with PT); predicted 48  UPPER EXTREMITY ROM:   Pt demos BUE shoulder ROM WFL for self care  UPPER EXTREMITY MMT:     MMT Right eval Left eval  Shoulder flexion 5 5  Shoulder abduction 5 5  Shoulder adduction    Shoulder extension    Shoulder internal rotation    Shoulder external rotation    Middle trapezius    Lower trapezius    Elbow flexion 5 5  Elbow extension 5 5  Wrist flexion 5 5  Wrist extension 5 5  Wrist ulnar deviation    Wrist radial deviation    Wrist pronation    Wrist supination    (Blank rows = not tested)  HAND FUNCTION: Grip strength: Right: 61 lbs; Left: 61  lbs, Lateral pinch: Right: 20 lbs, Left: 19 lbs, and 3 point pinch: Right: 11 lbs, Left: 15 lbs  COORDINATION: Finger Nose Finger test: increased time, L side touched lower than tip, corrected with min vc 9 Hole Peg test: Right: 1 min 18 sec; Left: 41 sec  SENSATION: Proprioception: Impaired   EDEMA: none  COGNITION: Overall cognitive status: History of cognitive impairments - at baseline; Parkinson's with dementia  VISION: Wears prism glasses; hx of visual hallucinations  PRAXIS: Impaired: Motor planning; bradykinesia, R>L  TODAY'S TREATMENT:                                                                                                                              Therapeutic Exercise: Facilitated R/L grip strengthening  with hand gripper set at heavy resistance with 2 green bands to complete 3 sets 10 reps of grip squeezes at beginning of session, and 2 more sets 10 reps mid session.  Rest between sets and cues for slow pace and technique to maximize hand closure with each squeeze.   Neuro re-ed: Facilitated bilat hand [place] and dexterity skills with practice scooping, storing, and using translatory skills with small glass [place] stones, working to move stones from palm to fingertips in prep for discarding.  Pt required mod vc for technique to utilize thumbs to move stones from palm rather than to shake hand to move the stones.  Pt practiced flipping a row of 10 stones on table top from flat side down to flat side up; completed trials with each hand.    PATIENT EDUCATION: Education details: B FMC/dexterity skills Person educated: Patient, Spouse, and Caregiver , [name] Education method: Explanation Education comprehension: verbalized understanding  HOME EXERCISE PROGRAM: To be established in the next 1-2 sessions   GOALS: Goals reviewed with patient? Yes  SHORT TERM GOALS: Target date: 04/11/22  1. Pt will complete HEP for increasing [place] and strength in bilat hands with direct supv at least 4 of 7 days per week. Baseline: not yet initiated Goal status: INITIAL  2. Pt will clean his glasses with min A and mod vc, including opening cleaning packet.   Baseline: max A  Goal status: initial   LONG TERM GOALS: Target date: 05/23/22  Pt will increase FOTO score by 5 or more points to indicate increase in self/caregiver perceived functional performance with daily tasks.  Baseline: 40.2 (predicted 48) Goal status: INITIAL  2.  Pt will increase R/L lateral pinch strength by 4 or more lbs to enable pt to independently turn on electric toothbrush and twist open toothpaste. Baseline: R 20, L 19; caregiver assist to turn on toothbrush and open toothpaste Goal status: INITIAL  3.  Pt will increase [place] in bilat  hands to manage buttons and zippers on clothing 75% of the time.  Baseline: caregivers assist with clothing fasteners at least 50% of the time Goal status: INITIAL  4.  Pt will scoop food from plate/bowl with min  vc using adapted utensils and adapted plates/bowls as needed.  Baseline: caregiver constantly turns plate to compensate for pt pushing food to 1 side. Goal status: INITIAL  5.  Pt will shave with electric razor with min A for thoroughness. Baseline: pt struggles to grade sufficient pressure to thoroughly shave face, requiring mod A. Goal status: INITIAL  ASSESSMENT:  CLINICAL IMPRESSION: Pt seen today with focus on facilitation of L/R FMC/dexterity skills.  Spouse ordered [place] game today for home coordination practice.  Spouse reports that pt has been using divided plate and this has been working very well for pt, reducing need for spouse to turn pt's plate when he scoops food onto utensils.  Pt continues to progress with picking up small pieces from table, but tends to drop items from palm during storage and translatory movements with storing several items at a time in palm.  Additional skilled OT will benefit pt to maximize hand strength and coordination for daily tasks, and provide strategies to maximize indep with ADLs and leisure activities to increase QOL.  PERFORMANCE DEFICITS: in functional skills including ADLs, IADLs, coordination, dexterity, proprioception, strength, pain, Fine motor control, Gross motor control, mobility, balance, body mechanics, endurance, decreased knowledge of use of DME, and UE functional use, cognitive skills including attention, learn, memory, orientation, problem solving, safety awareness, thought, and understand. IMPAIRMENTS: are limiting patient from ADLs, IADLs, rest and sleep, leisure, and social participation.   CO-MORBIDITIES: has co-morbidities such as dementia  that affects occupational performance. Patient will benefit from skilled OT to  address above impairments and improve overall function.  MODIFICATION OR ASSISTANCE TO COMPLETE EVALUATION: Min-Moderate modification of tasks or assist with assess necessary to complete an evaluation.  OT OCCUPATIONAL PROFILE AND HISTORY: Problem focused assessment: Including review of records relating to presenting problem.  CLINICAL DECISION MAKING: Moderate - several treatment options, min-mod task modification necessary  REHAB POTENTIAL: Good  EVALUATION COMPLEXITY: Moderate    PLAN:  OT FREQUENCY: 2x/week  OT DURATION: 12 weeks  PLANNED INTERVENTIONS: self care/ADL training, therapeutic exercise, therapeutic activity, neuromuscular re-education, manual therapy, balance training, functional mobility training, moist heat, cryotherapy, patient/family education, cognitive remediation/compensation, energy conservation, coping strategies training, and DME and/or AE instructions  RECOMMENDED OTHER SERVICES: N/A  CONSULTED AND AGREED WITH PLAN OF CARE: Patient and family member/caregiver  PLAN FOR NEXT SESSION: see eval  Leta Speller, MS, OTR/L  Darleene Cleaver, OT 04/04/2022, 3:21 PM

## 2022-04-04 NOTE — Therapy (Signed)
OUTPATIENT PHYSICAL THERAPY NEURO Treatment   Patient Name: [name] MRN: [id number] DOB:Apr 05, 1951, 71 y.o., male Today's Date: 04/04/2022   PCP: Dr. Elsie Stain REFERRING PROVIDER: Dr. Sharolyn Douglas   PT End of Session - 04/04/22 1137     Visit Number 9    Number of Visits 24    Date for PT Re-Evaluation 05/23/22    Authorization Type [employer] Medicare Choice PPO    Progress Note Due on Visit 10    PT Start Time 1101    PT Stop Time 1145    PT Time Calculation (min) 44 min    Equipment Utilized During Treatment Gait belt    Activity Tolerance Patient tolerated treatment well    Behavior During Therapy WFL for tasks assessed/performed                    Past Medical History:  Diagnosis Date   Arthritis    Bradycardia    Cancer ([place]) 2013   skin cancer   Depression    ptsd   Dysrhythmia    chronic slow heart rate   GERD (gastroesophageal reflux disease)    Headache(784.0)    tension headaches non recent   History of chicken pox    History of kidney stones    passed   Hypertension    treated with HCTZ   Pacemaker    Parkinson's disease    dx'ed 15 years ago   PTSD (post-traumatic stress disorder)    Shortness of breath dyspnea    Sleep apnea    doesn't use C-pap   Varicose veins    Past Surgical History:  Procedure Laterality Date   CHOLECYSTECTOMY N/A 10/22/2014   Procedure: LAPAROSCOPIC CHOLECYSTECTOMY WITH INTRAOPERATIVE CHOLANGIOGRAM;  Surgeon: Dia Crawford III, MD;  Location: [facility] ORS;  Service: General;  Laterality: N/A;   cyst removed      from lip as a child   INTRAMEDULLARY (IM) NAIL INTERTROCHANTERIC N/A 02/18/2019   Procedure: INTRAMEDULLARY (IM) NAIL INTERTROCHANTRIC, RIGHT,;  Surgeon: Thornton Park, MD;  Location: [facility] OR[facility];  Service: Orthopedics;  Laterality: N/A;   LUMBAR LAMINECTOMY/DECOMPRESSION MICRODISCECTOMY Bilateral 12/14/2012   Procedure: Bilateral lumbar three-four, four-five decompressive laminotomy/foraminotomy;   Surgeon: Charlie Pitter, MD;  Location: [facility];  Service: Neurosurgery;  Laterality: Bilateral;   PULSE GENERATOR IMPLANT Bilateral 12/13/2013   Procedure: Bilateral implantable pulse generator placement;  Surgeon: Erline Levine, MD;  Location: [facility];  Service: Neurosurgery;  Laterality: Bilateral;  Bilateral implantable pulse generator placement   skin cancer removed     from ears,   12 lft arm  rt leg 15   SUBTHALAMIC STIMULATOR BATTERY REPLACEMENT Bilateral 07/14/2017   Procedure: BILATERAL IMPLANTED PULSE GENERATOR CHANGE FOR DEEP BRAIN STIMULATOR;  Surgeon: Erline Levine, MD;  Location: [facility];  Service: Neurosurgery;  Laterality: Bilateral;   SUBTHALAMIC STIMULATOR INSERTION Bilateral 12/06/2013   Procedure: SUBTHALAMIC STIMULATOR INSERTION;  Surgeon: Erline Levine, MD;  Location: [facility];  Service: Neurosurgery;  Laterality: Bilateral;  Bilateral deep brain stimulator placement   Patient Active Problem List   Diagnosis Date Noted   Hematuria 10/10/2021   Sinus drainage 12/09/2020   Medicare annual wellness visit, subsequent 11/18/2020   Pacemaker    Dementia associated with Parkinson's disease ([place]) 06/17/2020   Vertigo 02/19/2020   Urine abnormality 02/19/2020   Dysphagia 02/23/2019   Fall at home 10/18/2017   REM behavioral disorder 11/02/2016   Radicular pain in right arm 10/09/2016   GERD (gastroesophageal  reflux disease) 07/31/2016   Colon cancer screening 07/23/2015   Gout 02/18/2015   Depression 01/06/2015   Skin lesion 10/02/2014   Dupuytren's contracture 08/20/2014   Cough 07/21/2014   Lumbar stenosis with neurogenic claudication 06/13/2014   S/P deep brain stimulator placement 05/08/2014   Advance care planning 01/19/2014   Parkinson's disease 12/13/2013   PTSD (post-traumatic stress disorder) 06/13/2013   Erectile dysfunction 06/13/2013   HLD (hyperlipidemia) 06/13/2013   Essential hypertension 06/03/2013   Bradycardia by electrocardiogram 06/03/2013    Obstructive sleep apnea 03/12/2013    ONSET DATE: 15 years ago per chart  REFERRING DIAG: Parkinson's disease  THERAPY DIAG:  Muscle weakness (generalized)  Parkinson's disease without dyskinesia or fluctuating manifestations  Unsteadiness on feet  Repeated falls  Abnormality of gait and mobility  Rationale for Evaluation and Treatment Rehabilitation  SUBJECTIVE:                                                                                                                                                                                              SUBJECTIVE STATEMENT: Pt reports continued distraction with environment causing him ot not use his walker. Pt caregiver reports one fall from a similar reason to his most previous fall ( stepping over objects)   Pt accompanied by: significant other  PERTINENT HISTORY:   Date of most recent Admission: 09/25/2021 Date of Discharge: 09/28/2021  Discharge Diagnoses:  Altered mental status Parkinson disease (CMS/HCC) Hallucinations  Per [facility] MED discharge summary notes PHI : Hospital Course:   71 y.o. male with a history of Parkinson's Disease w/ DBS in place, PD-related dementia, OSA, depression, and seizure disorder who presented for EEG given concern for seizures.  Altered mental status Concern seizure activity Periods of unresponsiveness on the setting of new medication--Nuplazid (pimavanserin) recently started. No events of note on continuous EEG. This was held and he returned to his baseline mental status. Other workup including CT head, CTA h/n without obvious cause for periodic unresponsiveness. Nuplazid was discontinued.   Aspiration, weakness Of note, he had a coughing spell while eating though sounds like was precipitated by sneezing. Did well on RA, seen by SLP without any further needs or concerns.  Parkinson's Disease Parkinson's-related dementia - He follows in the [facility] Disorders center at [facility]. Clinic notes  suggest he seems to have worsening of his overall disease trajectory. - Continued home Memantine and Sinemet      PMH: HTN, bradycardia s/p PPM, PD, DBS, COVID-19 infection, chronic low back pain s/p L4/5 surgery.  PAIN:  Are you having pain? Yes: NPRS scale: 7/10 Pain location: Low back Pain description:  ache Aggravating factors: Prolonged standing/sitting, bending over Relieving factors: rest, changing positions  PRECAUTIONS: Fall  WEIGHT BEARING RESTRICTIONS No  FALLS: Has patient fallen in last 6 months? Yes. Number of falls >10  LIVING ENVIRONMENT: Lives with: lives with their spouse Lives in: House/apartment Stairs:  Ramp Has following equipment at home: Single point cane, Environmental consultant - 4 wheeled, Wheelchair (manual), shower chair, bed side commode, Grab bars, and Ramped entry  PLOF: Needs assistance with ADLs, Needs assistance with homemaking, Needs assistance with gait, and Needs assistance with transfers  PATIENT GOALS To walk better and not fall. Improve fall frequency to prevent significant injury   OBJECTIVE: (objective measures completed at initial evaluation unless otherwise dated)   DIAGNOSTIC FINDINGS:   COGNITION: Overall cognitive status: History of cognitive impairments - at baseline   SENSATION: Light touch: WFL  COORDINATION: Decreased and slow to initiate  EDEMA:  None observed     POSTURE: rounded shoulders, forward head, increased thoracic kyphosis, and flexed trunk      TRANSFERS: Assistive device utilized:  Upright 4WW   Sit to stand:  Min/mod assist- with increased VC for safety Stand to sit:  Min assist with Verbal cues to lock brakes and reach back Chair to chair: Min A     GAIT: Gait pattern: decreased arm swing- Right, decreased arm swing- Left, decreased step length- Right, decreased step length- Left, decreased stance time- Right, decreased stance time- Left, decreased stride length, and shuffling Distance walked: 150  ft. Assistive device utilized:  upright 4WW Level of assistance: Min A Comments: Shuffling gait with slow gait speed  FUNCTIONAL TESTs:  5 times sit to stand: 19.5 sec with UE support with BUE support Timed up and go (TUG): 37.26 sec with upright 4WW 6 minute walk test: 720 feet using upright 4WW 10 meter walk test: 0.67 m/s  PATIENT SURVEYS:  FOTO 50  TODAY'S TREATMENT:  04/04/22     Activity Description: home base obstacle course with u step walker. Going randomly to different stations to work on [place] of tasks and always returning to chair for STS transition training. Difficulty with STS transition with using hands properly.   Activity Setting:  The [facility] setting was selected for the goal of improving spatial awareness and visual scanning ability, establishing a central point of reference during dynamic movements for enhanced balance and control.   Number of Pods:  5 Cycles/Sets:  3 Duration (Time or Hit Count):  6 min    LAQ 4# AW with hip ABD/ADD in seated  x 10 ea LE       Seated Hip ABD Blue TB 20 x 5 sec holds, cues for hold times.     Unless otherwise stated, CGA was provided and gait belt donned in order to ensure pt safety    PATIENT EDUCATION: Education details: PT plan of care; purpose of functional outcomes Person educated: Patient and Spouse Education method: Explanation and Verbal cues Education comprehension: verbalized understanding   HOME EXERCISE PROGRAM: To be reviewed and progressed next 1-2 visits    GOALS: Goals reviewed with patient? Yes  SHORT TERM GOALS: Target date: 01/11/2022  Pt will be independent with HEP in order to improve strength and balance in order to decrease fall risk and improve function at home and work.  Baseline: Patient reports not performing much in the way of any HEP except walking. Goal status: INITIAL  LONG TERM GOALS: Target date: 02/22/2022  Pt will decrease 5TSTS by at least  5 seconds in order  to demonstrate clinically significant improvement in LE strength. Baseline: 02/28/2022: 19.5 sec with UE support   Goal status: INITIAL  2.  Pt will improve FOTO to target score of 48 to display perceived improvements in ability to complete ADL's.  Baseline: 02/28/22=40 Goal status: INITIAL  3.  Pt will decrease TUG to < or = to 25 seconds/decrease in order to demonstrate decreased fall risk. Baseline: 02/28/22= 37.26 sec using upright 4WW Goal status: INITIAL  4.  Pt will increase 10MWT by at least 0.13 m/s in order to demonstrate clinically significant improvement in community ambulation.   Baseline: 02/28/22= 0.67 m/s using upright 4WW Goal status: INITIAL  5.  Patient will increase six minute walk test distance to >1000 for progression to community ambulator and improve gait ability  Baseline: 02/28/22: 720 feet with up walker  Goal status: INITIAL  6.  Patient will experience 1 fall or less per 2 week period for at least 3 weeks in order to improve his frequency of falls and prevent injury from falls  Baseline: 02/28/22: pt falls approximately once every 10 days  Goal status: INITIAL  ASSESSMENT:  CLINICAL IMPRESSION: Patient presents with good motivation for completion of physical therapy activities.  Continued with balance obstacle course with addition of dual task challenge using blaze pods for interventions as noted.  Patient still requires occasional cues for safety with sit to stand transitions as well as with proper use of walker and for reaching for chair to ensure it's location.  Pt will continue to benefit from skilled physical therapy intervention to address impairments, improve QOL, and attain therapy goals.     OBJECTIVE IMPAIRMENTS Abnormal gait, decreased activity tolerance, decreased balance, decreased cognition, decreased coordination, decreased endurance, decreased mobility, difficulty walking, decreased ROM, decreased strength, decreased safety awareness,  hypomobility, impaired perceived functional ability, and pain.   ACTIVITY LIMITATIONS carrying, lifting, bending, standing, squatting, stairs, transfers, bed mobility, bathing, toileting, dressing, self feeding, hygiene/grooming, and caring for others  PARTICIPATION LIMITATIONS: meal prep, cleaning, laundry, medication management, personal finances, driving, shopping, community activity, and yard work  PERSONAL FACTORS Time since onset of injury/illness/exacerbation are also affecting patient's functional outcome.   REHAB POTENTIAL: Fair    CLINICAL DECISION MAKING: Evolving/moderate complexity  EVALUATION COMPLEXITY: Moderate  PLAN: PT FREQUENCY: 2x/week  PT DURATION: 12 weeks  PLANNED INTERVENTIONS: Therapeutic exercises, Therapeutic activity, Neuromuscular re-education, Balance training, Gait training, Patient/Family education, Self Care, Joint mobilization, Joint manipulation, Stair training, Vestibular training, Canalith repositioning, DME instructions, Dry Needling, Electrical stimulation, Spinal manipulation, Spinal mobilization, Cryotherapy, Moist heat, Manual therapy, and Re-evaluation  PLAN FOR NEXT SESSION:work with pt regarding safety at home. [place], PT 04/04/2022, 2:34 PM

## 2022-04-06 ENCOUNTER — Ambulatory Visit: Payer: Medicare PPO

## 2022-04-06 ENCOUNTER — Ambulatory Visit: Payer: Medicare PPO | Admitting: Physical Therapy

## 2022-04-12 ENCOUNTER — Ambulatory Visit: Payer: Medicare PPO | Admitting: Psychology

## 2022-04-12 NOTE — Therapy (Unsigned)
OUTPATIENT PHYSICAL THERAPY NEURO Treatment / Physical Therapy Progress Note   Dates of reporting period  02/28/22   to   04/13/22    Patient Name: [name] MRN: [id number] DOB:1950/05/14, 71 y.o., male Today's Date: 04/13/2022   PCP: Dr. Elsie Stain REFERRING PROVIDER: Dr. Sharolyn Douglas   PT End of Session - 04/13/22 1144     Visit Number 10    Number of Visits 24    Date for PT Re-Evaluation 05/23/22    Authorization Type [employer] Choice PPO    Authorization Time Period Cert through 04/24/49    Progress Note Due on Visit 10    PT Start Time 1145    PT Stop Time 1223    PT Time Calculation (min) 38 min    Equipment Utilized During Treatment Gait belt    Activity Tolerance Patient tolerated treatment well    Behavior During Therapy WFL for tasks assessed/performed                     Past Medical History:  Diagnosis Date   Arthritis    Bradycardia    Cancer ([place]) 2013   skin cancer   Depression    ptsd   Dysrhythmia    chronic slow heart rate   GERD (gastroesophageal reflux disease)    Headache(784.0)    tension headaches non recent   History of chicken pox    History of kidney stones    passed   Hypertension    treated with HCTZ   Pacemaker    Parkinson's disease    dx'ed 15 years ago   PTSD (post-traumatic stress disorder)    Shortness of breath dyspnea    Sleep apnea    doesn't use C-pap   Varicose veins    Past Surgical History:  Procedure Laterality Date   CHOLECYSTECTOMY N/A 10/22/2014   Procedure: LAPAROSCOPIC CHOLECYSTECTOMY WITH INTRAOPERATIVE CHOLANGIOGRAM;  Surgeon: Dia Crawford III, MD;  Location: [facility];  Service: General;  Laterality: N/A;   cyst removed      from lip as a child   INTRAMEDULLARY (IM) NAIL INTERTROCHANTERIC N/A 02/18/2019   Procedure: INTRAMEDULLARY (IM) NAIL INTERTROCHANTRIC, RIGHT,;  Surgeon: Thornton Park, MD;  Location: [facility];  Service: Orthopedics;  Laterality: N/A;   LUMBAR  LAMINECTOMY/DECOMPRESSION MICRODISCECTOMY Bilateral 12/14/2012   Procedure: Bilateral lumbar three-four, four-five decompressive laminotomy/foraminotomy;  Surgeon: Charlie Pitter, MD;  Location: [place] [facility];  Service: Neurosurgery;  Laterality: Bilateral;   PULSE GENERATOR IMPLANT Bilateral 12/13/2013   Procedure: Bilateral implantable pulse generator placement;  Surgeon: Erline Levine, MD;  Location: [place] [facility];  Service: Neurosurgery;  Laterality: Bilateral;  Bilateral implantable pulse generator placement   skin cancer removed     from ears,   12 lft arm  rt leg 15   SUBTHALAMIC STIMULATOR BATTERY REPLACEMENT Bilateral 07/14/2017   Procedure: BILATERAL IMPLANTED PULSE GENERATOR CHANGE FOR DEEP BRAIN STIMULATOR;  Surgeon: Erline Levine, MD;  Location: [place];  Service: Neurosurgery;  Laterality: Bilateral;   SUBTHALAMIC STIMULATOR INSERTION Bilateral 12/06/2013   Procedure: SUBTHALAMIC STIMULATOR INSERTION;  Surgeon: Erline Levine, MD;  Location: [place] [facility];  Service: Neurosurgery;  Laterality: Bilateral;  Bilateral deep brain stimulator placement   Patient Active Problem List   Diagnosis Date Noted   Hematuria 10/10/2021   Sinus drainage 12/09/2020   Medicare annual wellness visit, subsequent 11/18/2020   Pacemaker    Dementia associated with Parkinson's disease ([place]) 06/17/2020   Vertigo 02/19/2020   Urine  abnormality 02/19/2020   Dysphagia 02/23/2019   Fall at home 10/18/2017   REM behavioral disorder 11/02/2016   Radicular pain in right arm 10/09/2016   GERD (gastroesophageal reflux disease) 07/31/2016   Colon cancer screening 07/23/2015   Gout 02/18/2015   Depression 01/06/2015   Skin lesion 10/02/2014   Dupuytren's contracture 08/20/2014   Cough 07/21/2014   Lumbar stenosis with neurogenic claudication 06/13/2014   S/P deep brain stimulator placement 05/08/2014   Advance care planning 01/19/2014   Parkinson's disease 12/13/2013   PTSD (post-traumatic stress disorder) 06/13/2013    Erectile dysfunction 06/13/2013   HLD (hyperlipidemia) 06/13/2013   Essential hypertension 06/03/2013   Bradycardia by electrocardiogram 06/03/2013   Obstructive sleep apnea 03/12/2013    ONSET DATE: 15 years ago per chart  REFERRING DIAG: Parkinson's disease  THERAPY DIAG:  Parkinson's disease without dyskinesia or fluctuating manifestations  Unsteadiness on feet  Repeated falls  Abnormality of gait and mobility  Difficulty in walking, not elsewhere classified  Rationale for Evaluation and Treatment Rehabilitation  SUBJECTIVE:                                                                                                                                                                                              SUBJECTIVE STATEMENT: Pt reports maybe 1 fall since last session but he cannot completely recall.  Patient's wife and primary caregiver is on vacation this week and he has aide staying with him in the meantime.  Pt accompanied by: significant other  PERTINENT HISTORY:   Date of most recent Admission: 09/25/2021 Date of Discharge: 09/28/2021  Discharge Diagnoses:  Altered mental status Parkinson disease (CMS/HCC) Hallucinations  Per [facility] discharge summary notes PHI : Hospital Course:   71 y.o. male with a history of Parkinson's Disease w/ DBS in place, PD-related dementia, OSA, depression, and seizure disorder who presented for EEG given concern for seizures.  Altered mental status Concern seizure activity Periods of unresponsiveness on the setting of new medication--Nuplazid (pimavanserin) recently started. No events of note on continuous EEG. This was held and he returned to his baseline mental status. Other workup including CT head, CTA h/n without obvious cause for periodic unresponsiveness. Nuplazid was discontinued.   Aspiration, weakness Of note, he had a coughing spell while eating though sounds like was precipitated by sneezing. Did well on RA,  seen by SLP without any further needs or concerns.  Parkinson's Disease Parkinson's-related dementia - He follows in the [facility] Disorders center at [facility]. Clinic notes suggest he seems to have worsening of his overall disease trajectory. - Continued home Memantine and Sinemet  PMH: HTN, bradycardia s/p PPM, PD, DBS, COVID-19 infection, chronic low back pain s/p L4/5 surgery.  PAIN:  Are you having pain? Yes: NPRS scale: 7/10 Pain location: Low back Pain description: ache Aggravating factors: Prolonged standing/sitting, bending over Relieving factors: rest, changing positions  PRECAUTIONS: Fall  WEIGHT BEARING RESTRICTIONS No  FALLS: Has patient fallen in last 6 months? Yes. Number of falls >10  LIVING ENVIRONMENT: Lives with: lives with their spouse Lives in: House/apartment Stairs:  Ramp Has following equipment at home: Single point cane, Environmental consultant - 4 wheeled, Wheelchair (manual), shower chair, bed side commode, Grab bars, and Ramped entry  PLOF: Needs assistance with ADLs, Needs assistance with homemaking, Needs assistance with gait, and Needs assistance with transfers  PATIENT GOALS To walk better and not fall. Improve fall frequency to prevent significant injury   OBJECTIVE: (objective measures completed at initial evaluation unless otherwise dated)   DIAGNOSTIC FINDINGS:   COGNITION: Overall cognitive status: History of cognitive impairments - at baseline   SENSATION: Light touch: WFL  COORDINATION: Decreased and slow to initiate  EDEMA:  None observed     POSTURE: rounded shoulders, forward head, increased thoracic kyphosis, and flexed trunk      TRANSFERS: Assistive device utilized:  Upright 4WW   Sit to stand:  Min/mod assist- with increased VC for safety Stand to sit:  Min assist with Verbal cues to lock brakes and reach back Chair to chair: Min A     GAIT: Gait pattern: decreased arm swing- Right, decreased arm swing- Left, decreased  step length- Right, decreased step length- Left, decreased stance time- Right, decreased stance time- Left, decreased stride length, and shuffling Distance walked: 150 ft. Assistive device utilized:  upright 4WW Level of assistance: Min A Comments: Shuffling gait with slow gait speed  FUNCTIONAL TESTs:  5 times sit to stand: 19.5 sec with UE support with BUE support Timed up and go (TUG): 37.26 sec with upright 4WW 6 minute walk test: 720 feet using upright 4WW 10 meter walk test: 0.67 m/s  PATIENT SURVEYS:  FOTO 50  TODAY'S TREATMENT:  04/13/22     Physical therapy treatment session today consisted of completing assessment of goals and administration of testing as demonstrated and documented in flow sheet, treatment, and goals section of this note.    PATIENT EDUCATION: Education details: PT plan of care; purpose of functional outcomes Person educated: Patient and Spouse Education method: Explanation and Verbal cues Education comprehension: verbalized understanding   HOME EXERCISE PROGRAM: To be reviewed and progressed next 1-2 visits    GOALS: Goals reviewed with patient? Yes  SHORT TERM GOALS: Target date: 01/11/2022  Pt will be independent with HEP in order to improve strength and balance in order to decrease fall risk and improve function at home and work.  Baseline: Patient reports not performing much in the way of any HEP except walking. 12/27: Having phases of participation and phases of refusal to participate.  Goal status: IN PROGRESS  LONG TERM GOALS: Target date: 02/22/2022  Pt will decrease 5TSTS by at least 5 seconds in order to demonstrate clinically significant improvement in LE strength. Baseline: 02/28/2022: 19.5 sec with UE support  04/13/22:11.4 sec  Goal status: IN PROGRESS- continue to monitor for consistent progression   2.  Pt will improve FOTO to target score of 48 to display perceived improvements in ability to complete ADL's.  Baseline:  02/28/22=40 12/27: 45 Goal status: IN PROGRESS  3.  Pt will decrease TUG to < or =  to 25 seconds/decrease in order to demonstrate decreased fall risk. Baseline: 02/28/22= 37.26 sec using upright 4WW 12/27:20.21 sec  Goal status: IN PROGRESS Keep assessing to ensure progress is maintained   4.  Pt will increase 10MWT by at least 0.13 m/s in order to demonstrate clinically significant improvement in community ambulation.   Baseline: 02/28/22= 0.67 m/s using upright 4WW 12/27: .89 m/s  Goal status: MET  5.  Patient will increase six minute walk test distance to >1000 for progression to community ambulator and improve gait ability  Baseline: 02/28/22: 720 feet with up walker 04/13/22: 815 feet with up walker  Goal status: IN PROGRESS  6.  Patient will experience 1 fall or less per 2 week period for at least 3 weeks in order to improve his frequency of falls and prevent injury from falls  Baseline:04/13/22: Pt has had 1 fall in the last 2 weeks to his knowledge Goal status: INITIAL  ASSESSMENT:  CLINICAL IMPRESSION: Patient visit physical therapy for progress note this date.  Patient is paced significant progress toward all of his goals.  Patient has made progress with focus on therapeutic outcomes survey scale indicating improved subjective level of safety and function with functional tasks.  Patient also made progress with his 6-minute walk test indicating improvement in safety with community ambulation.  In addition patient is also made progress with timed up and go, 10 m walk test, and 5 times sit to stand indicating decreased risk of falls. Pt will continue to benefit from skilled physical therapy intervention to address impairments, improve QOL, and attain therapy goals. Patient's condition has the potential to improve in response to therapy. Maximum improvement is yet to be obtained. The anticipated improvement is attainable and reasonable in a generally predictable time.      OBJECTIVE  IMPAIRMENTS Abnormal gait, decreased activity tolerance, decreased balance, decreased cognition, decreased coordination, decreased endurance, decreased mobility, difficulty walking, decreased ROM, decreased strength, decreased safety awareness, hypomobility, impaired perceived functional ability, and pain.   ACTIVITY LIMITATIONS carrying, lifting, bending, standing, squatting, stairs, transfers, bed mobility, bathing, toileting, dressing, self feeding, hygiene/grooming, and caring for others  PARTICIPATION LIMITATIONS: meal prep, cleaning, laundry, medication management, personal finances, driving, shopping, community activity, and yard work  PERSONAL FACTORS Time since onset of injury/illness/exacerbation are also affecting patient's functional outcome.   REHAB POTENTIAL: Fair    CLINICAL DECISION MAKING: Evolving/moderate complexity  EVALUATION COMPLEXITY: Moderate  PLAN: PT FREQUENCY: 2x/week  PT DURATION: 12 weeks  PLANNED INTERVENTIONS: Therapeutic exercises, Therapeutic activity, Neuromuscular re-education, Balance training, Gait training, Patient/Family education, Self Care, Joint mobilization, Joint manipulation, Stair training, Vestibular training, Canalith repositioning, DME instructions, Dry Needling, Electrical stimulation, Spinal manipulation, Spinal mobilization, Cryotherapy, Moist heat, Manual therapy, and Re-evaluation  PLAN FOR NEXT SESSION: HEP update    Particia Lather, PT 04/13/2022, 4:08 PM

## 2022-04-13 ENCOUNTER — Ambulatory Visit: Payer: Medicare PPO

## 2022-04-13 ENCOUNTER — Ambulatory Visit: Payer: Medicare PPO | Admitting: Physical Therapy

## 2022-04-13 DIAGNOSIS — R262 Difficulty in walking, not elsewhere classified: Secondary | ICD-10-CM | POA: Diagnosis not present

## 2022-04-13 DIAGNOSIS — R2681 Unsteadiness on feet: Secondary | ICD-10-CM | POA: Diagnosis not present

## 2022-04-13 DIAGNOSIS — R296 Repeated falls: Secondary | ICD-10-CM | POA: Diagnosis not present

## 2022-04-13 DIAGNOSIS — M6281 Muscle weakness (generalized): Secondary | ICD-10-CM | POA: Diagnosis not present

## 2022-04-13 DIAGNOSIS — G20A1 Parkinson's disease without dyskinesia, without mention of fluctuations: Secondary | ICD-10-CM

## 2022-04-13 DIAGNOSIS — R269 Unspecified abnormalities of gait and mobility: Secondary | ICD-10-CM | POA: Diagnosis not present

## 2022-04-13 DIAGNOSIS — R278 Other lack of coordination: Secondary | ICD-10-CM

## 2022-04-13 NOTE — Therapy (Signed)
OUTPATIENT OCCUPATIONAL THERAPY NEURO PROGRESS AND TREATMENT Reporting period 02/28/22-04/13/22  Patient Name: [name] MRN: [id number] DOB:11/05/1950, 71 y.o., male Today's Date: 04/13/2022  PCP: Dr. Elsie Stain REFERRING PROVIDER: Dr. Elsie Stain   OT End of Session - 04/13/22 1105     Visit Number 10    Number of Visits 24    Date for OT Re-Evaluation 05/23/22    Authorization Time Period Reporting period beginning 02/28/22-04/13/22    OT Start Time 44    OT Stop Time 1145    OT Time Calculation (min) 45 min    Equipment Utilized During Treatment rollator    Activity Tolerance Patient tolerated treatment well    Behavior During Therapy [facility] for tasks assessed/performed               Past Medical History:  Diagnosis Date   Arthritis    Bradycardia    Cancer ([place]) 2013   skin cancer   Depression    ptsd   Dysrhythmia    chronic slow heart rate   GERD (gastroesophageal reflux disease)    Headache(784.0)    tension headaches non recent   History of chicken pox    History of kidney stones    passed   Hypertension    treated with HCTZ   Pacemaker    Parkinson's disease    dx'ed 15 years ago   PTSD (post-traumatic stress disorder)    Shortness of breath dyspnea    Sleep apnea    doesn't use C-pap   Varicose veins    Past Surgical History:  Procedure Laterality Date   CHOLECYSTECTOMY N/A 10/22/2014   Procedure: LAPAROSCOPIC CHOLECYSTECTOMY WITH INTRAOPERATIVE CHOLANGIOGRAM;  Surgeon: Dia Crawford III, MD;  Location: [facility] ORS;  Service: General;  Laterality: N/A;   cyst removed      from lip as a child   INTRAMEDULLARY (IM) NAIL INTERTROCHANTERIC N/A 02/18/2019   Procedure: INTRAMEDULLARY (IM) NAIL INTERTROCHANTRIC, RIGHT,;  Surgeon: Thornton Park, MD;  Location: [facility] ORS;  Service: Orthopedics;  Laterality: N/A;   LUMBAR LAMINECTOMY/DECOMPRESSION MICRODISCECTOMY Bilateral 12/14/2012   Procedure: Bilateral lumbar three-four, four-five decompressive  laminotomy/foraminotomy;  Surgeon: Charlie Pitter, MD;  Location: [place] NEURO ORS;  Service: Neurosurgery;  Laterality: Bilateral;   PULSE GENERATOR IMPLANT Bilateral 12/13/2013   Procedure: Bilateral implantable pulse generator placement;  Surgeon: Erline Levine, MD;  Location: [place] NEURO ORS;  Service: Neurosurgery;  Laterality: Bilateral;  Bilateral implantable pulse generator placement   skin cancer removed     from ears,   12 lft arm  rt leg 15   SUBTHALAMIC STIMULATOR BATTERY REPLACEMENT Bilateral 07/14/2017   Procedure: BILATERAL IMPLANTED PULSE GENERATOR CHANGE FOR DEEP BRAIN STIMULATOR;  Surgeon: Erline Levine, MD;  Location: [place];  Service: Neurosurgery;  Laterality: Bilateral;   SUBTHALAMIC STIMULATOR INSERTION Bilateral 12/06/2013   Procedure: SUBTHALAMIC STIMULATOR INSERTION;  Surgeon: Erline Levine, MD;  Location: [place] NEURO ORS;  Service: Neurosurgery;  Laterality: Bilateral;  Bilateral deep brain stimulator placement   Patient Active Problem List   Diagnosis Date Noted   Hematuria 10/10/2021   Sinus drainage 12/09/2020   Medicare annual wellness visit, subsequent 11/18/2020   Pacemaker    Dementia associated with Parkinson's disease ([place]) 06/17/2020   Vertigo 02/19/2020   Urine abnormality 02/19/2020   Dysphagia 02/23/2019   Fall at home 10/18/2017   REM behavioral disorder 11/02/2016   Radicular pain in right arm 10/09/2016   GERD (gastroesophageal reflux disease) 07/31/2016   Colon cancer screening 07/23/2015  Gout 02/18/2015   Depression 01/06/2015   Skin lesion 10/02/2014   Dupuytren's contracture 08/20/2014   Cough 07/21/2014   Lumbar stenosis with neurogenic claudication 06/13/2014   S/P deep brain stimulator placement 05/08/2014   Advance care planning 01/19/2014   Parkinson's disease 12/13/2013   PTSD (post-traumatic stress disorder) 06/13/2013   Erectile dysfunction 06/13/2013   HLD (hyperlipidemia) 06/13/2013   Essential hypertension 06/03/2013   Bradycardia by  electrocardiogram 06/03/2013   Obstructive sleep apnea 03/12/2013    ONSET DATE: 2015  REFERRING DIAG: Parkinson's Disease  THERAPY DIAG:  Muscle weakness (generalized)  Other lack of coordination  Parkinson's disease without dyskinesia or fluctuating manifestations  Rationale for Evaluation and Treatment: Rehabilitation  SUBJECTIVE: Caregiver reports pt is having an "off" day.  Before leaving for therapy today, pt got scratched by his cat on the R hand, which caregiver covered in multiple bandages.  Pt's spouse is also out of town and pt tends to have some anxiety when spouse travels without him.    SUBJECTIVE STATEMENT: Pt accompanied by:  caregiver [name])  PERTINENT HISTORY: Parkinson's Disease, dementia, DBS, multiple falls  PRECAUTIONS: Fall  WEIGHT BEARING RESTRICTIONS: No  PAIN:  Are you having pain? Yes: NPRS scale: 3/10 Pain location: hips and low back, R knee Pain description: sharp, tight Aggravating factors: if pt crosses feet too long/awkward positioning Relieving factors: voltaren gel   FALLS: Has patient fallen in last 6 months? Yes. Number of falls : spouse estimates 20  LIVING ENVIRONMENT: Lives with: lives with their spouse Lives in: house, 1 level Stairs: No Has following equipment at home: [place] entry, handicapped accessible bathroom  PLOF: Needs assistance with ADLs  PATIENT GOALS: increase bilat hand coordination for ADLs  OBJECTIVE:   HAND DOMINANCE: Right  ADLs: Overall ADLs: caregiver assist for all ADLs; pt currently as 16 hrs of caregiver assist per week through the [place].  Pt recently was assigned 100% disability by the [place], and spouse reports caregiver hours will increase to 46 per week, but uncertain when Transfers/ambulation related to ADLs: supv; pt uses upright 4 wheeled walker Eating: caregivers help to cut food Grooming: mod A to shave with an electric razor UB Dressing: min A, difficulty with buttons LB Dressing: mod A,  difficulty with zippers and buttons Toileting: occasional accidents with bowel and bladder, but less than weekly and only during the night; distant supv for toileting during the day Bathing: min A for bathing, mod A for drying off Tub Shower transfers: min guard for shower transfers with grab bar and shower chair Equipment:  fully handicapped shower and toilet  IADLs: Shopping: dep on caregiver Light housekeeping: dep Meal Prep: dep Community mobility: short distance community amb with supv and 4 wheeled walker Medication management: dep Financial management: dep Handwriting:  printed first/last name 50% legible, cursive first/last name illegible  MOBILITY STATUS: Hx of falls  POSTURE COMMENTS:  rounded shoulders, forward head, increased thoracic kyphosis, and flexed trunk   FUNCTIONAL OUTCOME MEASURES: FOTO: 40.2 (completed with PT); predicted 48  UPPER EXTREMITY ROM:   Pt demos BUE shoulder ROM WFL for self care  UPPER EXTREMITY MMT:     MMT Right eval Left eval  Shoulder flexion 5 5  Shoulder abduction 5 5  Shoulder adduction    Shoulder extension    Shoulder internal rotation    Shoulder external rotation    Middle trapezius    Lower trapezius    Elbow flexion 5 5  Elbow extension 5 5  Wrist  flexion 5 5  Wrist extension 5 5  Wrist ulnar deviation    Wrist radial deviation    Wrist pronation    Wrist supination    (Blank rows = not tested)  HAND FUNCTION: Grip strength: Right: 61 lbs; Left: 61 lbs, Lateral pinch: Right: 20 lbs, Left: 19 lbs, and 3 point pinch: Right: 11 lbs, Left: 15 lbs 04/13/22: Grip strength: Right: 66 lbs; Left: 65 lbs, Lateral pinch: Right: 18 lbs, Left: 18 lbs, and 3 point pinch: Right: 14 lbs, Left: 15 lbs  COORDINATION: Finger Nose Finger test: increased time, L side touched lower than tip, corrected with min vc 9 Hole Peg test: Right: 1 min 18 sec; Left: 41 sec 04/13/22: 9 Hole Peg test: Right: 1 min 4 sec; Left: 37 sec    SENSATION: Proprioception: Impaired   EDEMA: none  COGNITION: Overall cognitive status: History of cognitive impairments - at baseline; Parkinson's with dementia  VISION: Wears prism glasses; hx of visual hallucinations  PRAXIS: Impaired: Motor planning; bradykinesia, R>L  TODAY'S TREATMENT:                                                                                                                              Therapeutic Exercise: Objective measures taken and goals updated for progress note.  Neuro re-ed: Facilitated bilat hand [place] and dexterity skills working with grooved pegs and pegboard.  Pt required min vc for turning pegs to match up the grooves when placing pegs with the R hand, and extra time and effort on the side.  Pt able to place pegs more efficiently with the L hand as compared to the R, but still with extra time on the L.  PATIENT EDUCATION: Education details: B [facility]/dexterity skills Person educated: Patient, Spouse, and Caregiver , [name] Education method: Explanation Education comprehension: verbalized understanding  HOME EXERCISE PROGRAM: To be established in the next 1-2 sessions   GOALS: Goals reviewed with patient? Yes  SHORT TERM GOALS: Target date: 04/11/22  1. Pt will complete HEP for increasing [place] and strength in bilat hands with direct supv at least 4 of 7 days per week. Baseline: not yet initiated; 04/03/22: caregiver reports pt has putty but rarely uses Goal status: ongoing  2. Pt will clean his glasses with min A and mod vc, including opening cleaning packet.   Baseline: max A; 04/13/22: able to clean with set up/supv with min vc.   Goal status: achieved   LONG TERM GOALS: Target date: 05/23/22  Pt will increase FOTO score by 5 or more points to indicate increase in self/caregiver perceived functional performance with daily tasks.  Baseline: 40.2 (predicted 48);  Goal status: INITIAL  2.  Pt will increase R/L lateral pinch strength by  4 or more lbs to enable pt to independently turn on electric toothbrush and twist open toothpaste. Baseline: R 20, L 19; caregiver assist to turn on toothbrush and open toothpaste; 04/13/22: B 18 lbs, pt still  struggles with above activities Goal status: INITIAL  3.  Pt will increase [place] in bilat hands to manage buttons and zippers on clothing 75% of the time.  Baseline: caregivers assist with clothing fasteners at least 50% of the time; 04/13/22: caregiver reports pt requires assist to set up zipper on coat, and pt can manage buttons on jeans and zipper, occasionally having to lie flat on back to manage fasteners.  Pt can manage buttons with extra time on shirt (9 hole: R 1 min 4 sec, L 37 sec) Goal status: ongoing  4.  Pt will scoop food from plate/bowl with min vc using adapted utensils and adapted plates/bowls as needed.  Baseline: caregiver constantly turns plate to compensate for pt pushing food to 1 side; 04/13/22: pt now uses divided plate which allows pt to scoop his food without having spouse turn his plate or bowl.  Pt continues to use built up eating utensils. Goal status: achieved  5.  Pt will shave with electric razor with min A for thoroughness. Baseline: pt struggles to grade sufficient pressure to thoroughly shave face, requiring mod A; 04/13/22: Spouse is waiting for a nicer razor from the [place], so last few times barber will shave him when he's getting a hair cut.   Goal status: ongoing  ASSESSMENT:  CLINICAL IMPRESSION: Pt seen this date for 10th visit progress update.  Pt demos improvements in R/L hand coordination as noted by significant improvement on 9 hole peg test bilat, see above.  Pt can now clean his glasses with set up/supv.  Pt is using a divided plate and built up utensils which allows pt to scoop his food without spouse needing to constantly turn his plate.  Bilat grip strength has improved.  Continued focus needed on lateral pinch strength with pt and caregiver  reporting that pt continues to struggle with pushing button to turn on his electric toothbrush.  Pt also reports it's getting more challenging to push buttons on his tv remote control.  Additional skilled OT will benefit pt to maximize hand strength and coordination for daily tasks, and provide strategies to maximize indep with ADLs and leisure activities to increase QOL.  PERFORMANCE DEFICITS: in functional skills including ADLs, IADLs, coordination, dexterity, proprioception, strength, pain, Fine motor control, Gross motor control, mobility, balance, body mechanics, endurance, decreased knowledge of use of DME, and UE functional use, cognitive skills including attention, learn, memory, orientation, problem solving, safety awareness, thought, and understand. IMPAIRMENTS: are limiting patient from ADLs, IADLs, rest and sleep, leisure, and social participation.   CO-MORBIDITIES: has co-morbidities such as dementia  that affects occupational performance. Patient will benefit from skilled OT to address above impairments and improve overall function.  MODIFICATION OR ASSISTANCE TO COMPLETE EVALUATION: Min-Moderate modification of tasks or assist with assess necessary to complete an evaluation.  OT OCCUPATIONAL PROFILE AND HISTORY: Problem focused assessment: Including review of records relating to presenting problem.  CLINICAL DECISION MAKING: Moderate - several treatment options, min-mod task modification necessary  REHAB POTENTIAL: Good  EVALUATION COMPLEXITY: Moderate    PLAN:  OT FREQUENCY: 2x/week  OT DURATION: 12 weeks  PLANNED INTERVENTIONS: self care/ADL training, therapeutic exercise, therapeutic activity, neuromuscular re-education, manual therapy, balance training, functional mobility training, moist heat, cryotherapy, patient/family education, cognitive remediation/compensation, energy conservation, coping strategies training, and DME and/or AE instructions  RECOMMENDED OTHER  SERVICES: N/A  CONSULTED AND AGREED WITH PLAN OF CARE: Patient and family member/caregiver  PLAN FOR NEXT SESSION: see eval  Leta Speller, MS, OTR/L  Darleene Cleaver, OT 04/13/2022, 1:25 PM

## 2022-04-20 ENCOUNTER — Ambulatory Visit: Payer: Medicare PPO | Admitting: Physical Therapy

## 2022-04-20 ENCOUNTER — Encounter: Payer: Self-pay | Admitting: Physical Therapy

## 2022-04-20 ENCOUNTER — Ambulatory Visit: Payer: Medicare PPO | Attending: Family Medicine | Admitting: Occupational Therapy

## 2022-04-20 DIAGNOSIS — R269 Unspecified abnormalities of gait and mobility: Secondary | ICD-10-CM

## 2022-04-20 DIAGNOSIS — R296 Repeated falls: Secondary | ICD-10-CM | POA: Insufficient documentation

## 2022-04-20 DIAGNOSIS — R2681 Unsteadiness on feet: Secondary | ICD-10-CM

## 2022-04-20 DIAGNOSIS — G20A1 Parkinson's disease without dyskinesia, without mention of fluctuations: Secondary | ICD-10-CM | POA: Insufficient documentation

## 2022-04-20 DIAGNOSIS — R262 Difficulty in walking, not elsewhere classified: Secondary | ICD-10-CM

## 2022-04-20 DIAGNOSIS — R278 Other lack of coordination: Secondary | ICD-10-CM | POA: Diagnosis not present

## 2022-04-20 DIAGNOSIS — M6281 Muscle weakness (generalized): Secondary | ICD-10-CM | POA: Insufficient documentation

## 2022-04-20 NOTE — Therapy (Addendum)
OUTPATIENT OCCUPATIONAL THERAPY NEURO TREATMENT NOTE  Patient Name: [name] MRN: [id number] DOB:1950/07/01, 72 y.o., male Today's Date: 04/20/2022  PCP: Dr. Elsie Stain REFERRING PROVIDER: Dr. Elsie Stain   OT End of Session - 04/20/22 1331     Visit Number 11    Number of Visits 24    Date for OT Re-Evaluation 05/23/22    Authorization Time Period Reporting period beginning 04/13/2022    OT Start Time 1015   OT Stop Time 1100   OT Time Calculation (min) 45 min    Activity Tolerance Patient tolerated treatment well    Behavior During Therapy [name] for tasks assessed/performed               Past Medical History:  Diagnosis Date   Arthritis    Bradycardia    Cancer ([place]) 2013   skin cancer   Depression    ptsd   Dysrhythmia    chronic slow heart rate   GERD (gastroesophageal reflux disease)    Headache(784.0)    tension headaches non recent   History of chicken pox    History of kidney stones    passed   Hypertension    treated with HCTZ   Pacemaker    Parkinson's disease    dx'ed 15 years ago   PTSD (post-traumatic stress disorder)    Shortness of breath dyspnea    Sleep apnea    doesn't use C-pap   Varicose veins    Past Surgical History:  Procedure Laterality Date   CHOLECYSTECTOMY N/A 10/22/2014   Procedure: LAPAROSCOPIC CHOLECYSTECTOMY WITH INTRAOPERATIVE CHOLANGIOGRAM;  Surgeon: Dia Crawford III, MD;  Location: [facility] ORS;  Service: General;  Laterality: N/A;   cyst removed      from lip as a child   INTRAMEDULLARY (IM) NAIL INTERTROCHANTERIC N/A 02/18/2019   Procedure: INTRAMEDULLARY (IM) NAIL INTERTROCHANTRIC, RIGHT,;  Surgeon: Thornton Park, MD;  Location: [facility] OR[facility];  Service: Orthopedics;  Laterality: N/A;   LUMBAR LAMINECTOMY/DECOMPRESSION MICRODISCECTOMY Bilateral 12/14/2012   Procedure: Bilateral lumbar three-four, four-five decompressive laminotomy/foraminotomy;  Surgeon: Charlie Pitter, MD;  Location: [place] [facility];  Service: Neurosurgery;   Laterality: Bilateral;   PULSE GENERATOR IMPLANT Bilateral 12/13/2013   Procedure: Bilateral implantable pulse generator placement;  Surgeon: Erline Levine, MD;  Location: [place] [facility];  Service: Neurosurgery;  Laterality: Bilateral;  Bilateral implantable pulse generator placement   skin cancer removed     from ears,   12 lft arm  rt leg 15   SUBTHALAMIC STIMULATOR BATTERY REPLACEMENT Bilateral 07/14/2017   Procedure: BILATERAL IMPLANTED PULSE GENERATOR CHANGE FOR DEEP BRAIN STIMULATOR;  Surgeon: Erline Levine, MD;  Location: [place];  Service: Neurosurgery;  Laterality: Bilateral;   SUBTHALAMIC STIMULATOR INSERTION Bilateral 12/06/2013   Procedure: SUBTHALAMIC STIMULATOR INSERTION;  Surgeon: Erline Levine, MD;  Location: [place] [facility];  Service: Neurosurgery;  Laterality: Bilateral;  Bilateral deep brain stimulator placement   Patient Active Problem List   Diagnosis Date Noted   Hematuria 10/10/2021   Sinus drainage 12/09/2020   Medicare annual wellness visit, subsequent 11/18/2020   Pacemaker    Dementia associated with Parkinson's disease ([place]) 06/17/2020   Vertigo 02/19/2020   Urine abnormality 02/19/2020   Dysphagia 02/23/2019   Fall at home 10/18/2017   REM behavioral disorder 11/02/2016   Radicular pain in right arm 10/09/2016   GERD (gastroesophageal reflux disease) 07/31/2016   Colon cancer screening 07/23/2015   Gout 02/18/2015   Depression 01/06/2015   Skin lesion 10/02/2014   Dupuytren's  contracture 08/20/2014   Cough 07/21/2014   Lumbar stenosis with neurogenic claudication 06/13/2014   S/P deep brain stimulator placement 05/08/2014   Advance care planning 01/19/2014   Parkinson's disease 12/13/2013   PTSD (post-traumatic stress disorder) 06/13/2013   Erectile dysfunction 06/13/2013   HLD (hyperlipidemia) 06/13/2013   Essential hypertension 06/03/2013   Bradycardia by electrocardiogram 06/03/2013   Obstructive sleep apnea 03/12/2013    ONSET DATE: 2015  REFERRING  DIAG: Parkinson's Disease  THERAPY DIAG:  Muscle weakness (generalized)  Other lack of coordination  Rationale for Evaluation and Treatment: Rehabilitation  SUBJECTIVE: Caregiver reports pt is having an "off" day.  Before leaving for therapy today, pt got scratched by his cat on the R hand, which caregiver covered in multiple bandages.  Pt's spouse is also out of town and pt tends to have some anxiety when spouse travels without him.    SUBJECTIVE STATEMENT: Pt accompanied by:  caregiver [name])  PERTINENT HISTORY: Parkinson's Disease, dementia, DBS, multiple falls  PRECAUTIONS: Fall  WEIGHT BEARING RESTRICTIONS: No  PAIN:  Are you having pain? Yes: NPRS scale: 3/10 Pain location: hips and low back, R knee Pain description: sharp, tight Aggravating factors: if pt crosses feet too long/awkward positioning Relieving factors: voltaren gel   FALLS: Has patient fallen in last 6 months? Yes. Number of falls : spouse estimates 20  LIVING ENVIRONMENT: Lives with: lives with their spouse Lives in: house, 1 level Stairs: No Has following equipment at home: [place] entry, handicapped accessible bathroom  PLOF: Needs assistance with ADLs  PATIENT GOALS: increase bilat hand coordination for ADLs  OBJECTIVE:   HAND DOMINANCE: Right  ADLs: Overall ADLs: caregiver assist for all ADLs; pt currently as 16 hrs of caregiver assist per week through the [place].  Pt recently was assigned 100% disability by the [place], and spouse reports caregiver hours will increase to 46 per week, but uncertain when Transfers/ambulation related to ADLs: supv; pt uses upright 4 wheeled walker Eating: caregivers help to cut food Grooming: mod A to shave with an electric razor UB Dressing: min A, difficulty with buttons LB Dressing: mod A, difficulty with zippers and buttons Toileting: occasional accidents with bowel and bladder, but less than weekly and only during the night; distant supv for toileting during the  day Bathing: min A for bathing, mod A for drying off Tub Shower transfers: min guard for shower transfers with grab bar and shower chair Equipment:  fully handicapped shower and toilet  IADLs: Shopping: dep on caregiver Light housekeeping: dep Meal Prep: dep Community mobility: short distance community amb with supv and 4 wheeled walker Medication management: dep Financial management: dep Handwriting:  printed first/last name 50% legible, cursive first/last name illegible  MOBILITY STATUS: Hx of falls  POSTURE COMMENTS:  rounded shoulders, forward head, increased thoracic kyphosis, and flexed trunk   FUNCTIONAL OUTCOME MEASURES: FOTO: 40.2 (completed with PT); predicted 48  UPPER EXTREMITY ROM:   Pt demos BUE shoulder ROM WFL for self care  UPPER EXTREMITY MMT:     MMT Right eval Left eval  Shoulder flexion 5 5  Shoulder abduction 5 5  Shoulder adduction    Shoulder extension    Shoulder internal rotation    Shoulder external rotation    Middle trapezius    Lower trapezius    Elbow flexion 5 5  Elbow extension 5 5  Wrist flexion 5 5  Wrist extension 5 5  Wrist ulnar deviation    Wrist radial deviation    Wrist  pronation    Wrist supination    (Blank rows = not tested)  HAND FUNCTION: Grip strength: Right: 61 lbs; Left: 61 lbs, Lateral pinch: Right: 20 lbs, Left: 19 lbs, and 3 point pinch: Right: 11 lbs, Left: 15 lbs 04/13/22: Grip strength: Right: 66 lbs; Left: 65 lbs, Lateral pinch: Right: 18 lbs, Left: 18 lbs, and 3 point pinch: Right: 14 lbs, Left: 15 lbs  COORDINATION: Finger Nose Finger test: increased time, L side touched lower than tip, corrected with min vc 9 Hole Peg test: Right: 1 min 18 sec; Left: 41 sec 04/13/22: 9 Hole Peg test: Right: 1 min 4 sec; Left: 37 sec   SENSATION: Proprioception: Impaired   EDEMA: none  COGNITION: Overall cognitive status: History of cognitive impairments - at baseline; Parkinson's with dementia  VISION: Wears  prism glasses; hx of visual hallucinations  PRAXIS: Impaired: Motor planning; bradykinesia, R>L  TODAY'S TREATMENT:                                                                                                                               Therapeutic Exercise:  Pt. worked on bilateral pinch strengthening for lateral, and 3pt. pinch using yellow, red, green, blue, and black resistive clips. Pt. worked on placing the clips onto a horizontal dowel. Pt. worked on Clinical cytogeneticist moving the clips from one pinch position to the next in preparation for placing them onto the dowel. Tactile and verbal cues were required for eliciting the desired movement. Pt. worked on grasping one inch resistive cubes with a 3pt. Grasp. The board was positioned at a vertical angle. Pt. worked on pressing them back into place while isolating 2nd digit in extension. Pt. worked on bilateral digit flexion using the DigiFlex. Pt. worked on individual digit flexion, as well as 2 digits simultaneous.   Pt. continues to present with limited pinch strength. Pt. required increased cues to formulate movements for 3pt., and lateral pinch strength position as well as translatory movements moving them from one pinch position to the next. Pt. Required cues for visual demonstration, as well as cues to mirror, and model the movement pattern. Pt. required fewer cues to sustain 3pt. pinch grasp on the resistive cubes when removing them from the board, and isolating the 2nd digit in extension while pressing them into place. Pt. continues to benefit from OT services to maximize hand strength and coordination for daily tasks, and provide strategies to maximize independence with ADLs and leisure activities to increase QOL.   PATIENT EDUCATION: Education details: B [facility]/dexterity skills Person educated: Patient, Spouse, and Caregiver , [name] Education method: Explanation Education comprehension: verbalized understanding  HOME EXERCISE  PROGRAM: To be established in the next 1-2 sessions   GOALS: Goals reviewed with patient? Yes  SHORT TERM GOALS: Target date: 04/11/22  1. Pt will complete HEP for increasing [place] and strength in bilat hands with direct supv at least 4 of 7 days per week. Baseline: not  yet initiated; 04/03/22: caregiver reports pt has putty but rarely uses Goal status: ongoing  2. Pt will clean his glasses with min A and mod vc, including opening cleaning packet.   Baseline: max A; 04/13/22: able to clean with set up/supv with min vc.   Goal status: achieved   LONG TERM GOALS: Target date: 05/23/22  Pt will increase FOTO score by 5 or more points to indicate increase in self/caregiver perceived functional performance with daily tasks.  Baseline: 40.2 (predicted 48);  Goal status: INITIAL  2.  Pt will increase R/L lateral pinch strength by 4 or more lbs to enable pt to independently turn on electric toothbrush and twist open toothpaste. Baseline: R 20, L 19; caregiver assist to turn on toothbrush and open toothpaste; 04/13/22: B 18 lbs, pt still struggles with above activities Goal status: INITIAL  3.  Pt will increase [place] in bilat hands to manage buttons and zippers on clothing 75% of the time.  Baseline: caregivers assist with clothing fasteners at least 50% of the time; 04/13/22: caregiver reports pt requires assist to set up zipper on coat, and pt can manage buttons on jeans and zipper, occasionally having to lie flat on back to manage fasteners.  Pt can manage buttons with extra time on shirt (9 hole: R 1 min 4 sec, L 37 sec) Goal status: ongoing  4.  Pt will scoop food from plate/bowl with min vc using adapted utensils and adapted plates/bowls as needed.  Baseline: caregiver constantly turns plate to compensate for pt pushing food to 1 side; 04/13/22: pt now uses divided plate which allows pt to scoop his food without having spouse turn his plate or bowl.  Pt continues to use built up eating  utensils. Goal status: achieved  5.  Pt will shave with electric razor with min A for thoroughness. Baseline: pt struggles to grade sufficient pressure to thoroughly shave face, requiring mod A; 04/13/22: Spouse is waiting for a nicer razor from the [place], so last few times barber will shave him when he's getting a hair cut.   Goal status: ongoing  ASSESSMENT:  CLINICAL IMPRESSION:  Pt. continues to present with limited pinch strength. Pt. required increased cues to formulate movements for 3pt., and lateral pinch strength position as well as translatory movements moving them from one pinch position to the next. Pt. Required cues for visual demonstration, as well as cues to mirror, and model the movement pattern. Pt. required fewer cues to sustain 3pt. pinch grasp on the resistive cubes when removing them from the board, and isolating the 2nd digit in extension while pressing them into place. Pt. continues to benefit from OT services to maximize hand strength and coordination for daily tasks, and provide strategies to maximize indep with ADLs and leisure activities to increase QOL.  PERFORMANCE DEFICITS: in functional skills including ADLs, IADLs, coordination, dexterity, proprioception, strength, pain, Fine motor control, Gross motor control, mobility, balance, body mechanics, endurance, decreased knowledge of use of DME, and UE functional use, cognitive skills including attention, learn, memory, orientation, problem solving, safety awareness, thought, and understand. IMPAIRMENTS: are limiting patient from ADLs, IADLs, rest and sleep, leisure, and social participation.   CO-MORBIDITIES: has co-morbidities such as dementia  that affects occupational performance. Patient will benefit from skilled OT to address above impairments and improve overall function.  MODIFICATION OR ASSISTANCE TO COMPLETE EVALUATION: Min-Moderate modification of tasks or assist with assess necessary to complete an  evaluation.  OT OCCUPATIONAL PROFILE AND HISTORY: Problem focused assessment:  Including review of records relating to presenting problem.  CLINICAL DECISION MAKING: Moderate - several treatment options, min-mod task modification necessary  REHAB POTENTIAL: Good  EVALUATION COMPLEXITY: Moderate    PLAN:  OT FREQUENCY: 2x/week  OT DURATION: 12 weeks  PLANNED INTERVENTIONS: self care/ADL training, therapeutic exercise, therapeutic activity, neuromuscular re-education, manual therapy, balance training, functional mobility training, moist heat, cryotherapy, patient/family education, cognitive remediation/compensation, energy conservation, coping strategies training, and DME and/or AE instructions  RECOMMENDED OTHER SERVICES: N/A  CONSULTED AND AGREED WITH PLAN OF CARE: Patient and family member/caregiver  PLAN FOR NEXT SESSION: see eval  Leta Speller, MS, OTR/L  Harrel Carina, OT 04/20/2022, 1:56 PM

## 2022-04-20 NOTE — Therapy (Signed)
OUTPATIENT PHYSICAL THERAPY NEURO Treatment / Physical Therapy Progress Note   Dates of reporting period  02/28/22   to   04/13/22    Patient Name: [name] MRN: [id number] DOB:1950-04-24, 72 y.o., male Today's Date: 04/20/2022   PCP: Dr. Elsie Stain REFERRING PROVIDER: Dr. Sharolyn Douglas   PT End of Session - 04/20/22 1032     Visit Number 11    Number of Visits 24    Date for PT Re-Evaluation 05/23/22    Authorization Type [employer] Choice PPO    Authorization Time Period Cert through 1/0/17    Progress Note Due on Visit 20    PT Start Time 1100    PT Stop Time 1144    PT Time Calculation (min) 44 min    Equipment Utilized During Treatment Gait belt    Activity Tolerance Patient tolerated treatment well    Behavior During Therapy WFL for tasks assessed/performed                     Past Medical History:  Diagnosis Date   Arthritis    Bradycardia    Cancer ([place]) 2013   skin cancer   Depression    ptsd   Dysrhythmia    chronic slow heart rate   GERD (gastroesophageal reflux disease)    Headache(784.0)    tension headaches non recent   History of chicken pox    History of kidney stones    passed   Hypertension    treated with HCTZ   Pacemaker    Parkinson's disease    dx'ed 15 years ago   PTSD (post-traumatic stress disorder)    Shortness of breath dyspnea    Sleep apnea    doesn't use C-pap   Varicose veins    Past Surgical History:  Procedure Laterality Date   CHOLECYSTECTOMY N/A 10/22/2014   Procedure: LAPAROSCOPIC CHOLECYSTECTOMY WITH INTRAOPERATIVE CHOLANGIOGRAM;  Surgeon: Dia Crawford III, MD;  Location: [facility];  Service: General;  Laterality: N/A;   cyst removed      from lip as a child   INTRAMEDULLARY (IM) NAIL INTERTROCHANTERIC N/A 02/18/2019   Procedure: INTRAMEDULLARY (IM) NAIL INTERTROCHANTRIC, RIGHT,;  Surgeon: Thornton Park, MD;  Location: [facility];  Service: Orthopedics;  Laterality: N/A;   LUMBAR  LAMINECTOMY/DECOMPRESSION MICRODISCECTOMY Bilateral 12/14/2012   Procedure: Bilateral lumbar three-four, four-five decompressive laminotomy/foraminotomy;  Surgeon: Charlie Pitter, MD;  Location: [facility];  Service: Neurosurgery;  Laterality: Bilateral;   PULSE GENERATOR IMPLANT Bilateral 12/13/2013   Procedure: Bilateral implantable pulse generator placement;  Surgeon: Erline Levine, MD;  Location: [facility];  Service: Neurosurgery;  Laterality: Bilateral;  Bilateral implantable pulse generator placement   skin cancer removed     from ears,   12 lft arm  rt leg 15   SUBTHALAMIC STIMULATOR BATTERY REPLACEMENT Bilateral 07/14/2017   Procedure: BILATERAL IMPLANTED PULSE GENERATOR CHANGE FOR DEEP BRAIN STIMULATOR;  Surgeon: Erline Levine, MD;  Location: [facility];  Service: Neurosurgery;  Laterality: Bilateral;   SUBTHALAMIC STIMULATOR INSERTION Bilateral 12/06/2013   Procedure: SUBTHALAMIC STIMULATOR INSERTION;  Surgeon: Erline Levine, MD;  Location: [facility];  Service: Neurosurgery;  Laterality: Bilateral;  Bilateral deep brain stimulator placement   Patient Active Problem List   Diagnosis Date Noted   Hematuria 10/10/2021   Sinus drainage 12/09/2020   Medicare annual wellness visit, subsequent 11/18/2020   Pacemaker    Dementia associated with Parkinson's disease ([place]) 06/17/2020   Vertigo 02/19/2020   Urine  abnormality 02/19/2020   Dysphagia 02/23/2019   Fall at home 10/18/2017   REM behavioral disorder 11/02/2016   Radicular pain in right arm 10/09/2016   GERD (gastroesophageal reflux disease) 07/31/2016   Colon cancer screening 07/23/2015   Gout 02/18/2015   Depression 01/06/2015   Skin lesion 10/02/2014   Dupuytren's contracture 08/20/2014   Cough 07/21/2014   Lumbar stenosis with neurogenic claudication 06/13/2014   S/P deep brain stimulator placement 05/08/2014   Advance care planning 01/19/2014   Parkinson's disease 12/13/2013   PTSD (post-traumatic stress disorder) 06/13/2013    Erectile dysfunction 06/13/2013   HLD (hyperlipidemia) 06/13/2013   Essential hypertension 06/03/2013   Bradycardia by electrocardiogram 06/03/2013   Obstructive sleep apnea 03/12/2013    ONSET DATE: 15 years ago per chart  REFERRING DIAG: Parkinson's disease  THERAPY DIAG:  Parkinson's disease without dyskinesia or fluctuating manifestations  Unsteadiness on feet  Repeated falls  Abnormality of gait and mobility  Difficulty in walking, not elsewhere classified  Rationale for Evaluation and Treatment Rehabilitation  SUBJECTIVE:                                                                                                                                                                                              SUBJECTIVE STATEMENT: Pt caregiver reports 2 falls since last encounter secondary to hallucinations where pt gets up and runs to take care of something like a knocking on door or car in the yard.   Pt accompanied by: significant other  PERTINENT HISTORY:   Date of most recent Admission: 09/25/2021 Date of Discharge: 09/28/2021  Discharge Diagnoses:  Altered mental status Parkinson disease (CMS/HCC) Hallucinations  Per [facility] discharge summary notes PHI : Hospital Course:   72 y.o. male with a history of Parkinson's Disease w/ DBS in place, PD-related dementia, OSA, depression, and seizure disorder who presented for EEG given concern for seizures.  Altered mental status Concern seizure activity Periods of unresponsiveness on the setting of new medication--Nuplazid (pimavanserin) recently started. No events of note on continuous EEG. This was held and he returned to his baseline mental status. Other workup including CT head, CTA h/n without obvious cause for periodic unresponsiveness. Nuplazid was discontinued.   Aspiration, weakness Of note, he had a coughing spell while eating though sounds like was precipitated by sneezing. Did well on RA, seen by SLP  without any further needs or concerns.  Parkinson's Disease Parkinson's-related dementia - He follows in the Movement Disorders center at [facility]. Clinic notes suggest he seems to have worsening of his overall disease trajectory. - Continued home Memantine and Sinemet  PMH: HTN, bradycardia s/p PPM, PD, DBS, COVID-19 infection, chronic low back pain s/p L4/5 surgery.  PAIN:  Are you having pain? Yes: NPRS scale: 7/10 Pain location: Low back Pain description: ache Aggravating factors: Prolonged standing/sitting, bending over Relieving factors: rest, changing positions  PRECAUTIONS: Fall  WEIGHT BEARING RESTRICTIONS No  FALLS: Has patient fallen in last 6 months? Yes. Number of falls >10  LIVING ENVIRONMENT: Lives with: lives with their spouse Lives in: House/apartment Stairs:  Ramp Has following equipment at home: Single point cane, Environmental consultant - 4 wheeled, Wheelchair (manual), shower chair, bed side commode, Grab bars, and Ramped entry  PLOF: Needs assistance with ADLs, Needs assistance with homemaking, Needs assistance with gait, and Needs assistance with transfers  PATIENT GOALS To walk better and not fall. Improve fall frequency to prevent significant injury   OBJECTIVE: (objective measures completed at initial evaluation unless otherwise dated)   DIAGNOSTIC FINDINGS:   COGNITION: Overall cognitive status: History of cognitive impairments - at baseline   SENSATION: Light touch: WFL  COORDINATION: Decreased and slow to initiate  EDEMA:  None observed     POSTURE: rounded shoulders, forward head, increased thoracic kyphosis, and flexed trunk      TRANSFERS: Assistive device utilized:  Upright 4WW   Sit to stand:  Min/mod assist- with increased VC for safety Stand to sit:  Min assist with Verbal cues to lock brakes and reach back Chair to chair: Min A     GAIT: Gait pattern: decreased arm swing- Right, decreased arm swing- Left, decreased step length-  Right, decreased step length- Left, decreased stance time- Right, decreased stance time- Left, decreased stride length, and shuffling Distance walked: 150 ft. Assistive device utilized:  upright 4WW Level of assistance: Min A Comments: Shuffling gait with slow gait speed  FUNCTIONAL TESTs:  5 times sit to stand: 19.5 sec with UE support with BUE support Timed up and go (TUG): 37.26 sec with upright 4WW 6 minute walk test: 720 feet using upright 4WW 10 meter walk test: 0.67 m/s  PATIENT SURVEYS:  FOTO 50  TODAY'S TREATMENT:  04/20/22   Access Code: [email or web address] URL: [email or web address] Date: 04/20/2022 Prepared by: Rivka Barbara  Exercises - Sit to stand, walk with assistive device to another chair, sit and repeat.   - 1 x daily - 7 x weekly - 2 sets - 10 reps - Standing March with Counter Support  - 1 x daily - 7 x weekly - 3 sets - 10 reps - Standing in "bowling stance" with counter support   - 1 x daily - 7 x weekly - 3 sets - 10 reps - Heel Raises with Counter Support  - 1 x daily - 7 x weekly - 3 sets - 10 reps - Seated shoulder row while sitting on stability disc   - 1 x daily - 7 x weekly - 3 sets - 10 reps   PATIENT EDUCATION: Education details: PT plan of care; purpose of functional outcomes Person educated: Patient and Spouse Education method: Explanation and Verbal cues Education comprehension: verbalized understanding   HOME EXERCISE PROGRAM: Access Code: [email or web address] URL: [email or web address] Date: 04/20/2022 Prepared by: Rivka Barbara  Exercises - Sit to stand, walk with assistive device to another chair, sit and repeat.   - 1 x daily - 7 x weekly - 2 sets - 10 reps - Standing March with Counter Support  - 1 x daily - 7 x weekly - 3 sets - 10 reps - Standing in "bowling stance" with  counter support   - 1 x daily - 7 x weekly - 3 sets - 10 reps - Heel Raises with Counter Support  - 1 x daily - 7 x weekly - 3 sets - 10 reps -  Seated shoulder row while sitting on stability disc   - 1 x daily - 7 x weekly - 3 sets - 10 reps    GOALS: Goals reviewed with patient? Yes  SHORT TERM GOALS: Target date: 01/11/2022  Pt will be independent with HEP in order to improve strength and balance in order to decrease fall risk and improve function at home and work.  Baseline: Patient reports not performing much in the way of any HEP except walking. 12/27: Having phases of participation and phases of refusal to participate.  Goal status: IN PROGRESS  LONG TERM GOALS: Target date: 02/22/2022  Pt will decrease 5TSTS by at least 5 seconds in order to demonstrate clinically significant improvement in LE strength. Baseline: 02/28/2022: 19.5 sec with UE support  04/13/22:11.4 sec  Goal status: IN PROGRESS- continue to monitor for consistent progression   2.  Pt will improve FOTO to target score of 48 to display perceived improvements in ability to complete ADL's.  Baseline: 02/28/22=40 12/27: 45 Goal status: IN PROGRESS  3.  Pt will decrease TUG to < or = to 25 seconds/decrease in order to demonstrate decreased fall risk. Baseline: 02/28/22= 37.26 sec using upright 4WW 12/27:20.21 sec  Goal status: IN PROGRESS Keep assessing to ensure progress is maintained   4.  Pt will increase 10MWT by at least 0.13 m/s in order to demonstrate clinically significant improvement in community ambulation.   Baseline: 02/28/22= 0.67 m/s using upright 4WW 12/27: .89 m/s  Goal status: MET  5.  Patient will increase six minute walk test distance to >1000 for progression to community ambulator and improve gait ability  Baseline: 02/28/22: 720 feet with up walker 04/13/22: 815 feet with up walker  Goal status: IN PROGRESS  6.  Patient will experience 1 fall or less per 2 week period for at least 3 weeks in order to improve his frequency of falls and prevent injury from falls  Baseline:04/13/22: Pt has had 1 fall in the last 2 weeks to his  knowledge Goal status: INITIAL  ASSESSMENT:  CLINICAL IMPRESSION: Patient presents with good motivation for completion of physical therapy activities.  Physical therapist spent ample time going over home exercise program with patient and his primary caregiver/wife and his hired caregiver.  Patient responds well to interventions and demonstrates improved ability to perform sit to stand activities with proper form following multiple reps and cues during exercise.  Patient caregiver also educated regarding assistive devices and time spent looking at different appropriate assistive devices for the patient out of our equipment room. Pt will continue to benefit from skilled physical therapy intervention to address impairments, improve QOL, and attain therapy goals.      OBJECTIVE IMPAIRMENTS Abnormal gait, decreased activity tolerance, decreased balance, decreased cognition, decreased coordination, decreased endurance, decreased mobility, difficulty walking, decreased ROM, decreased strength, decreased safety awareness, hypomobility, impaired perceived functional ability, and pain.   ACTIVITY LIMITATIONS carrying, lifting, bending, standing, squatting, stairs, transfers, bed mobility, bathing, toileting, dressing, self feeding, hygiene/grooming, and caring for others  PARTICIPATION LIMITATIONS: meal prep, cleaning, laundry, medication management, personal finances, driving, shopping, community activity, and yard work  PERSONAL FACTORS Time since onset of injury/illness/exacerbation are also affecting patient's functional outcome.   REHAB POTENTIAL: Fair    CLINICAL  DECISION MAKING: Evolving/moderate complexity  EVALUATION COMPLEXITY: Moderate  PLAN: PT FREQUENCY: 2x/week  PT DURATION: 12 weeks  PLANNED INTERVENTIONS: Therapeutic exercises, Therapeutic activity, Neuromuscular re-education, Balance training, Gait training, Patient/Family education, Self Care, Joint mobilization, Joint  manipulation, Stair training, Vestibular training, Canalith repositioning, DME instructions, Dry Needling, Electrical stimulation, Spinal manipulation, Spinal mobilization, Cryotherapy, Moist heat, Manual therapy, and Re-evaluation  PLAN FOR NEXT SESSION: Continue POC   Particia Lather, PT 04/20/2022, 10:34 AM

## 2022-04-25 ENCOUNTER — Ambulatory Visit: Payer: Medicare PPO | Admitting: Physical Therapy

## 2022-04-25 ENCOUNTER — Encounter: Payer: Self-pay | Admitting: Physical Therapy

## 2022-04-25 ENCOUNTER — Ambulatory Visit: Payer: Medicare PPO

## 2022-04-25 DIAGNOSIS — R269 Unspecified abnormalities of gait and mobility: Secondary | ICD-10-CM

## 2022-04-25 DIAGNOSIS — R262 Difficulty in walking, not elsewhere classified: Secondary | ICD-10-CM

## 2022-04-25 DIAGNOSIS — G20A1 Parkinson's disease without dyskinesia, without mention of fluctuations: Secondary | ICD-10-CM | POA: Diagnosis not present

## 2022-04-25 DIAGNOSIS — R296 Repeated falls: Secondary | ICD-10-CM | POA: Diagnosis not present

## 2022-04-25 DIAGNOSIS — R2681 Unsteadiness on feet: Secondary | ICD-10-CM

## 2022-04-25 DIAGNOSIS — M6281 Muscle weakness (generalized): Secondary | ICD-10-CM

## 2022-04-25 DIAGNOSIS — R278 Other lack of coordination: Secondary | ICD-10-CM

## 2022-04-25 NOTE — Therapy (Signed)
OUTPATIENT OCCUPATIONAL THERAPY NEURO TREATMENT NOTE  Patient Name: [name] MRN: [id number] DOB:1951/01/15, 72 y.o., male Today's Date: 04/25/2022  PCP: Dr. Crawford Givens REFERRING PROVIDER: Dr. Crawford Givens   OT End of Session - 04/20/22 1331     Visit Number 11    Number of Visits 24    Date for OT Re-Evaluation 05/23/22    Authorization Time Period Reporting period beginning 04/13/2022    OT Start Time 1015   OT Stop Time 1100   OT Time Calculation (min) 45 min    Activity Tolerance Patient tolerated treatment well    Behavior During Therapy [facility] for tasks assessed/performed               Past Medical History:  Diagnosis Date   Arthritis    Bradycardia    Cancer (HCC) 2013   skin cancer   Depression    ptsd   Dysrhythmia    chronic slow heart rate   GERD (gastroesophageal reflux disease)    Headache(784.0)    tension headaches non recent   History of chicken pox    History of kidney stones    passed   Hypertension    treated with HCTZ   Pacemaker    Parkinson's disease    dx'ed 15 years ago   PTSD (post-traumatic stress disorder)    Shortness of breath dyspnea    Sleep apnea    doesn't use C-pap   Varicose veins    Past Surgical History:  Procedure Laterality Date   CHOLECYSTECTOMY N/A 10/22/2014   Procedure: LAPAROSCOPIC CHOLECYSTECTOMY WITH INTRAOPERATIVE CHOLANGIOGRAM;  Surgeon: Tiney Rouge III, MD;  Location: [facility] ORS;  Service: General;  Laterality: N/A;   cyst removed      from lip as a child   INTRAMEDULLARY (IM) NAIL INTERTROCHANTERIC N/A 02/18/2019   Procedure: INTRAMEDULLARY (IM) NAIL INTERTROCHANTRIC, RIGHT,;  Surgeon: Juanell Fairly, MD;  Location: [facility] ORS;  Service: Orthopedics;  Laterality: N/A;   LUMBAR LAMINECTOMY/DECOMPRESSION MICRODISCECTOMY Bilateral 12/14/2012   Procedure: Bilateral lumbar three-four, four-five decompressive laminotomy/foraminotomy;  Surgeon: Temple Pacini, MD;  Location: [facility];  Service: Neurosurgery;   Laterality: Bilateral;   PULSE GENERATOR IMPLANT Bilateral 12/13/2013   Procedure: Bilateral implantable pulse generator placement;  Surgeon: Maeola Harman, MD;  Location: [facility];  Service: Neurosurgery;  Laterality: Bilateral;  Bilateral implantable pulse generator placement   skin cancer removed     from ears,   12 lft arm  rt leg 15   SUBTHALAMIC STIMULATOR BATTERY REPLACEMENT Bilateral 07/14/2017   Procedure: BILATERAL IMPLANTED PULSE GENERATOR CHANGE FOR DEEP BRAIN STIMULATOR;  Surgeon: Maeola Harman, MD;  Location: [facility] OR;  Service: Neurosurgery;  Laterality: Bilateral;   SUBTHALAMIC STIMULATOR INSERTION Bilateral 12/06/2013   Procedure: SUBTHALAMIC STIMULATOR INSERTION;  Surgeon: Maeola Harman, MD;  Location: [facility];  Service: Neurosurgery;  Laterality: Bilateral;  Bilateral deep brain stimulator placement   Patient Active Problem List   Diagnosis Date Noted   Hematuria 10/10/2021   Sinus drainage 12/09/2020   Medicare annual wellness visit, subsequent 11/18/2020   Pacemaker    Dementia associated with Parkinson's disease (HCC) 06/17/2020   Vertigo 02/19/2020   Urine abnormality 02/19/2020   Dysphagia 02/23/2019   Fall at home 10/18/2017   REM behavioral disorder 11/02/2016   Radicular pain in right arm 10/09/2016   GERD (gastroesophageal reflux disease) 07/31/2016   Colon cancer screening 07/23/2015   Gout 02/18/2015   Depression 01/06/2015   Skin lesion 10/02/2014   Dupuytren's  contracture 08/20/2014   Cough 07/21/2014   Lumbar stenosis with neurogenic claudication 06/13/2014   S/P deep brain stimulator placement 05/08/2014   Advance care planning 01/19/2014   Parkinson's disease 12/13/2013   PTSD (post-traumatic stress disorder) 06/13/2013   Erectile dysfunction 06/13/2013   HLD (hyperlipidemia) 06/13/2013   Essential hypertension 06/03/2013   Bradycardia by electrocardiogram 06/03/2013   Obstructive sleep apnea 03/12/2013    ONSET DATE: 2015  REFERRING  DIAG: Parkinson's Disease  THERAPY DIAG:  Muscle weakness (generalized)  Other lack of coordination  Rationale for Evaluation and Treatment: Rehabilitation  SUBJECTIVE: Pt reports he and his wife will be attending a visitation later today for a church friend who passed over the weekend.   SUBJECTIVE STATEMENT: Pt accompanied by:  caregiver Seward Grater)  PERTINENT HISTORY: Parkinson's Disease, dementia, DBS, multiple falls  PRECAUTIONS: Fall  WEIGHT BEARING RESTRICTIONS: No  PAIN:  Are you having pain? Yes: NPRS scale: 4/10 Pain location: hips and low back, R knee Pain description: sharp, tight Aggravating factors: if pt crosses feet too long/awkward positioning Relieving factors: voltaren gel   FALLS: Has patient fallen in last 6 months? Yes. Number of falls : spouse estimates 20  LIVING ENVIRONMENT: Lives with: lives with their spouse Lives in: house, 1 level Stairs: No Has following equipment at home: Ramped entry, handicapped accessible bathroom  PLOF: Needs assistance with ADLs  PATIENT GOALS: increase bilat hand coordination for ADLs  OBJECTIVE:   HAND DOMINANCE: Right  ADLs: Overall ADLs: caregiver assist for all ADLs; pt currently as 16 hrs of caregiver assist per week through the [place].  Pt recently was assigned 100% disability by the [place], and spouse reports caregiver hours will increase to 46 per week, but uncertain when Transfers/ambulation related to ADLs: supv; pt uses upright 4 wheeled walker Eating: caregivers help to cut food Grooming: mod A to shave with an electric razor UB Dressing: min A, difficulty with buttons LB Dressing: mod A, difficulty with zippers and buttons Toileting: occasional accidents with bowel and bladder, but less than weekly and only during the night; distant supv for toileting during the day Bathing: min A for bathing, mod A for drying off Tub Shower transfers: min guard for shower transfers with grab bar and shower chair Equipment:   fully handicapped shower and toilet  IADLs: Shopping: dep on caregiver Light housekeeping: dep Meal Prep: dep Community mobility: short distance community amb with supv and 4 wheeled walker Medication management: dep Financial management: dep Handwriting:  printed first/last name 50% legible, cursive first/last name illegible  MOBILITY STATUS: Hx of falls  POSTURE COMMENTS:  rounded shoulders, forward head, increased thoracic kyphosis, and flexed trunk   FUNCTIONAL OUTCOME MEASURES: FOTO: 40.2 (completed with PT); predicted 48  UPPER EXTREMITY ROM:   Pt demos BUE shoulder ROM WFL for self care  UPPER EXTREMITY MMT:     MMT Right eval Left eval  Shoulder flexion 5 5  Shoulder abduction 5 5  Shoulder adduction    Shoulder extension    Shoulder internal rotation    Shoulder external rotation    Middle trapezius    Lower trapezius    Elbow flexion 5 5  Elbow extension 5 5  Wrist flexion 5 5  Wrist extension 5 5  Wrist ulnar deviation    Wrist radial deviation    Wrist pronation    Wrist supination    (Blank rows = not tested)  HAND FUNCTION: Grip strength: Right: 61 lbs; Left: 61 lbs, Lateral pinch: Right: 20  lbs, Left: 19 lbs, and 3 point pinch: Right: 11 lbs, Left: 15 lbs 04/13/22: Grip strength: Right: 66 lbs; Left: 65 lbs, Lateral pinch: Right: 18 lbs, Left: 18 lbs, and 3 point pinch: Right: 14 lbs, Left: 15 lbs  COORDINATION: Finger Nose Finger test: increased time, L side touched lower than tip, corrected with min vc 9 Hole Peg test: Right: 1 min 18 sec; Left: 41 sec 04/13/22: 9 Hole Peg test: Right: 1 min 4 sec; Left: 37 sec   SENSATION: Proprioception: Impaired   EDEMA: none  COGNITION: Overall cognitive status: History of cognitive impairments - at baseline; Parkinson's with dementia  VISION: Wears prism glasses; hx of visual hallucinations  PRAXIS: Impaired: Motor planning; bradykinesia, R>L  TODAY'S TREATMENT:                                                                                                                                Therapeutic Exercise: Facilitated hand strengthening with use of hand gripper set at 17.9# on the L hand and 23.4# on the R hand to remove jumbo pegs from pegboard x2 trials each hand.  Pt required intermittent cues for maintaining strong grasp on hand gripper to prevent dropping gripper upon release of pegs.  Neuro re-ed: Pt worked with [employer], practicing single arm R and L and bilat hand coordination to construct and deconstruct pegboard pieces.  Pt required mod sequencing cues and vc to increase speed of L hand during bilat coordination tasks.  Pt practiced storing up to 5 pieces in hand and discarding 1 at a time, moving pieces from palm to fingertips.    PATIENT EDUCATION: Education details: B FMC/dexterity skills Person educated: Patient, Spouse, and Caregiver , [name] Education method: Explanation Education comprehension: verbalized understanding  HOME EXERCISE PROGRAM: To be established in the next 1-2 sessions   GOALS: Goals reviewed with patient? Yes  SHORT TERM GOALS: Target date: 04/11/22  1. Pt will complete HEP for increasing FMC and strength in bilat hands with direct supv at least 4 of 7 days per week. Baseline: not yet initiated; 04/03/22: caregiver reports pt has putty but rarely uses Goal status: ongoing  2. Pt will clean his glasses with min A and mod vc, including opening cleaning packet.   Baseline: max A; 04/13/22: able to clean with set up/supv with min vc.   Goal status: achieved   LONG TERM GOALS: Target date: 05/23/22  Pt will increase FOTO score by 5 or more points to indicate increase in self/caregiver perceived functional performance with daily tasks.  Baseline: 40.2 (predicted 48);  Goal status: INITIAL  2.  Pt will increase R/L lateral pinch strength by 4 or more lbs to enable pt to independently turn on electric toothbrush and twist open  toothpaste. Baseline: R 20, L 19; caregiver assist to turn on toothbrush and open toothpaste; 04/13/22: B 18 lbs, pt still struggles with above activities Goal status: INITIAL  3.  Pt  will increase FMC in bilat hands to manage buttons and zippers on clothing 75% of the time.  Baseline: caregivers assist with clothing fasteners at least 50% of the time; 04/13/22: caregiver reports pt requires assist to set up zipper on coat, and pt can manage buttons on jeans and zipper, occasionally having to lie flat on back to manage fasteners.  Pt can manage buttons with extra time on shirt (9 hole: R 1 min 4 sec, L 37 sec) Goal status: ongoing  4.  Pt will scoop food from plate/bowl with min vc using adapted utensils and adapted plates/bowls as needed.  Baseline: caregiver constantly turns plate to compensate for pt pushing food to 1 side; 04/13/22: pt now uses divided plate which allows pt to scoop his food without having spouse turn his plate or bowl.  Pt continues to use built up eating utensils. Goal status: achieved  5.  Pt will shave with electric razor with min A for thoroughness. Baseline: pt struggles to grade sufficient pressure to thoroughly shave face, requiring mod A; 04/13/22: Spouse is waiting for a nicer razor from the [place], so last few times barber will shave him when he's getting a hair cut.   Goal status: ongoing  ASSESSMENT:  CLINICAL IMPRESSION: Pt tolerated therapeutic exercises and neuro re-ed activities well this date.  When practicing bilat hand coordination skills, L hand tends to lag slightly behind the R, but pt responded well to vc to increase attention to L hand speed.  Pt tolerates 23.4# of resistance on the R hand, and 1 level down on the L hand at 17.9#, requiring occasional cues for technique to reduce dropped pegs.  Pt. continues to benefit from OT services to maximize hand strength and coordination for daily tasks, and provide strategies to maximize indep with ADLs and leisure  activities to increase QOL.  PERFORMANCE DEFICITS: in functional skills including ADLs, IADLs, coordination, dexterity, proprioception, strength, pain, Fine motor control, Gross motor control, mobility, balance, body mechanics, endurance, decreased knowledge of use of DME, and UE functional use, cognitive skills including attention, learn, memory, orientation, problem solving, safety awareness, thought, and understand. IMPAIRMENTS: are limiting patient from ADLs, IADLs, rest and sleep, leisure, and social participation.   CO-MORBIDITIES: has co-morbidities such as dementia  that affects occupational performance. Patient will benefit from skilled OT to address above impairments and improve overall function.  MODIFICATION OR ASSISTANCE TO COMPLETE EVALUATION: Min-Moderate modification of tasks or assist with assess necessary to complete an evaluation.  OT OCCUPATIONAL PROFILE AND HISTORY: Problem focused assessment: Including review of records relating to presenting problem.  CLINICAL DECISION MAKING: Moderate - several treatment options, min-mod task modification necessary  REHAB POTENTIAL: Good  EVALUATION COMPLEXITY: Moderate    PLAN:  OT FREQUENCY: 2x/week  OT DURATION: 12 weeks  PLANNED INTERVENTIONS: self care/ADL training, therapeutic exercise, therapeutic activity, neuromuscular re-education, manual therapy, balance training, functional mobility training, moist heat, cryotherapy, patient/family education, cognitive remediation/compensation, energy conservation, coping strategies training, and DME and/or AE instructions  RECOMMENDED OTHER SERVICES: N/A  CONSULTED AND AGREED WITH PLAN OF CARE: Patient and family member/caregiver  PLAN FOR NEXT SESSION: see eval  Danelle Earthly, MS, OTR/L  [employer] Dials, OT 04/25/2022, 4:58 PM

## 2022-04-25 NOTE — Therapy (Signed)
OUTPATIENT PHYSICAL THERAPY NEURO Treatment     Patient Name: [name] MRN: [id number] DOB:May 05, 1950, 72 y.o., male Today's Date: 04/25/2022   PCP: Dr. Elsie Stain REFERRING PROVIDER: Dr. Sharolyn Douglas   PT End of Session - 04/25/22 1150     Visit Number 12    Number of Visits 24    Date for PT Re-Evaluation 05/23/22    Authorization Type [employer] Medicare Choice PPO    Authorization Time Period Cert through 0/7/37    Progress Note Due on Visit 20    PT Start Time 1147    PT Stop Time 1230    PT Time Calculation (min) 43 min    Equipment Utilized During Treatment Gait belt    Activity Tolerance Patient tolerated treatment well    Behavior During Therapy WFL for tasks assessed/performed                      Past Medical History:  Diagnosis Date   Arthritis    Bradycardia    Cancer ([place]) 2013   skin cancer   Depression    ptsd   Dysrhythmia    chronic slow heart rate   GERD (gastroesophageal reflux disease)    Headache(784.0)    tension headaches non recent   History of chicken pox    History of kidney stones    passed   Hypertension    treated with HCTZ   Pacemaker    Parkinson's disease    dx'ed 15 years ago   PTSD (post-traumatic stress disorder)    Shortness of breath dyspnea    Sleep apnea    doesn't use C-pap   Varicose veins    Past Surgical History:  Procedure Laterality Date   CHOLECYSTECTOMY N/A 10/22/2014   Procedure: LAPAROSCOPIC CHOLECYSTECTOMY WITH INTRAOPERATIVE CHOLANGIOGRAM;  Surgeon: Dia Crawford III, MD;  Location: [facility] ORS;  Service: General;  Laterality: N/A;   cyst removed      from lip as a child   INTRAMEDULLARY (IM) NAIL INTERTROCHANTERIC N/A 02/18/2019   Procedure: INTRAMEDULLARY (IM) NAIL INTERTROCHANTRIC, RIGHT,;  Surgeon: Thornton Park, MD;  Location: [facility] OR[facility];  Service: Orthopedics;  Laterality: N/A;   LUMBAR LAMINECTOMY/DECOMPRESSION MICRODISCECTOMY Bilateral 12/14/2012   Procedure: Bilateral lumbar  three-four, four-five decompressive laminotomy/foraminotomy;  Surgeon: Charlie Pitter, MD;  Location: [place];  Service: Neurosurgery;  Laterality: Bilateral;   PULSE GENERATOR IMPLANT Bilateral 12/13/2013   Procedure: Bilateral implantable pulse generator placement;  Surgeon: Erline Levine, MD;  Location: [place] [facility];  Service: Neurosurgery;  Laterality: Bilateral;  Bilateral implantable pulse generator placement   skin cancer removed     from ears,   12 lft arm  rt leg 15   SUBTHALAMIC STIMULATOR BATTERY REPLACEMENT Bilateral 07/14/2017   Procedure: BILATERAL IMPLANTED PULSE GENERATOR CHANGE FOR DEEP BRAIN STIMULATOR;  Surgeon: Erline Levine, MD;  Location: [facility];  Service: Neurosurgery;  Laterality: Bilateral;   SUBTHALAMIC STIMULATOR INSERTION Bilateral 12/06/2013   Procedure: SUBTHALAMIC STIMULATOR INSERTION;  Surgeon: Erline Levine, MD;  Location: [place] [facility];  Service: Neurosurgery;  Laterality: Bilateral;  Bilateral deep brain stimulator placement   Patient Active Problem List   Diagnosis Date Noted   Hematuria 10/10/2021   Sinus drainage 12/09/2020   Medicare annual wellness visit, subsequent 11/18/2020   Pacemaker    Dementia associated with Parkinson's disease ([place]) 06/17/2020   Vertigo 02/19/2020   Urine abnormality 02/19/2020   Dysphagia 02/23/2019   Fall at home 10/18/2017   REM behavioral disorder  11/02/2016   Radicular pain in right arm 10/09/2016   GERD (gastroesophageal reflux disease) 07/31/2016   Colon cancer screening 07/23/2015   Gout 02/18/2015   Depression 01/06/2015   Skin lesion 10/02/2014   Dupuytren's contracture 08/20/2014   Cough 07/21/2014   Lumbar stenosis with neurogenic claudication 06/13/2014   S/P deep brain stimulator placement 05/08/2014   Advance care planning 01/19/2014   Parkinson's disease 12/13/2013   PTSD (post-traumatic stress disorder) 06/13/2013   Erectile dysfunction 06/13/2013   HLD (hyperlipidemia) 06/13/2013   Essential  hypertension 06/03/2013   Bradycardia by electrocardiogram 06/03/2013   Obstructive sleep apnea 03/12/2013    ONSET DATE: 15 years ago per chart  REFERRING DIAG: Parkinson's disease  THERAPY DIAG:  Parkinson's disease without dyskinesia or fluctuating manifestations  Unsteadiness on feet  Repeated falls  Abnormality of gait and mobility  Muscle weakness (generalized)  Difficulty in walking, not elsewhere classified  Rationale for Evaluation and Treatment Rehabilitation  SUBJECTIVE:                                                                                                                                                                                              SUBJECTIVE STATEMENT: Pt caregiver reports no falls but 2 losses of loved ones since last visit.   Pt accompanied by: significant other  PERTINENT HISTORY:   Date of most recent Admission: 09/25/2021 Date of Discharge: 09/28/2021  Discharge Diagnoses:  Altered mental status Parkinson disease (CMS/HCC) Hallucinations  Per [facility] discharge summary notes PHI : Hospital Course:   72 y.o. male with a history of Parkinson's Disease w/ DBS in place, PD-related dementia, OSA, depression, and seizure disorder who presented for EEG given concern for seizures.  Altered mental status Concern seizure activity Periods of unresponsiveness on the setting of new medication--Nuplazid (pimavanserin) recently started. No events of note on continuous EEG. This was held and he returned to his baseline mental status. Other workup including CT head, CTA h/n without obvious cause for periodic unresponsiveness. Nuplazid was discontinued.   Aspiration, weakness Of note, he had a coughing spell while eating though sounds like was precipitated by sneezing. Did well on RA, seen by SLP without any further needs or concerns.  Parkinson's Disease Parkinson's-related dementia - He follows in the Movement Disorders center at [facility].  Clinic notes suggest he seems to have worsening of his overall disease trajectory. - Continued home Memantine and Sinemet      PMH: HTN, bradycardia s/p PPM, PD, DBS, COVID-19 infection, chronic low back pain s/p L4/5 surgery.  PAIN:  Are you having pain? Yes: NPRS scale: 7/10 Pain location: Low  back Pain description: ache Aggravating factors: Prolonged standing/sitting, bending over Relieving factors: rest, changing positions  PRECAUTIONS: Fall  WEIGHT BEARING RESTRICTIONS No  FALLS: Has patient fallen in last 6 months? Yes. Number of falls >10  LIVING ENVIRONMENT: Lives with: lives with their spouse Lives in: House/apartment Stairs:  Ramp Has following equipment at home: Single point cane, Environmental consultant - 4 wheeled, Wheelchair (manual), shower chair, bed side commode, Grab bars, and Ramped entry  PLOF: Needs assistance with ADLs, Needs assistance with homemaking, Needs assistance with gait, and Needs assistance with transfers  PATIENT GOALS To walk better and not fall. Improve fall frequency to prevent significant injury   OBJECTIVE: (objective measures completed at initial evaluation unless otherwise dated)   DIAGNOSTIC FINDINGS:   COGNITION: Overall cognitive status: History of cognitive impairments - at baseline   SENSATION: Light touch: WFL  COORDINATION: Decreased and slow to initiate  EDEMA:  None observed     POSTURE: rounded shoulders, forward head, increased thoracic kyphosis, and flexed trunk      TRANSFERS: Assistive device utilized:  Upright 4WW   Sit to stand:  Min/mod assist- with increased VC for safety Stand to sit:  Min assist with Verbal cues to lock brakes and reach back Chair to chair: Min A     GAIT: Gait pattern: decreased arm swing- Right, decreased arm swing- Left, decreased step length- Right, decreased step length- Left, decreased stance time- Right, decreased stance time- Left, decreased stride length, and shuffling Distance  walked: 150 ft. Assistive device utilized:  upright 4WW Level of assistance: Min A Comments: Shuffling gait with slow gait speed  FUNCTIONAL TESTs:  5 times sit to stand: 19.5 sec with UE support with BUE support Timed up and go (TUG): 37.26 sec with upright 4WW 6 minute walk test: 720 feet using upright 4WW 10 meter walk test: 0.67 m/s  PATIENT SURVEYS:  FOTO 50  TODAY'S TREATMENT:  04/25/22  Activity Description: Marylu Lund obstacle course all involving patient leaving walker in area and having to recall to bring it with him to the neck station Activity Setting: Random random Number of Pods:   55 Cycles/Sets:  4 Duration (Time or Hit Count):  10  Patient Stats  : Patient initially only able to complete this in 8-minute timeframe.  With subsequent rounds patient challenged to decrease time in order to facilitate more dual task.  In the initial round patient had to speed up patient left walker behind had to be provided with cues to remember to bring walker with him.  Following this patient able to continue to improve his times going from 5 minutes down to 3 minutes and 30 seconds and also while referring to bring his walker on the third and fourth rounds will continue to integrate this.  Aspect to continue to challenge patient to room to bring his walker in situations where he is in a rush  Rows seated on dynadisc with GTB 3 x 15   PATIENT EDUCATION: Education details: PT plan of care; purpose of functional outcomes Person educated: Patient and Spouse Education method: Explanation and Verbal cues Education comprehension: verbalized understanding   HOME EXERCISE PROGRAM: Access Code: [email or web address] URL: [email or web address] Date: 04/20/2022 Prepared by: Rivka Barbara  Exercises - Sit to stand, walk with assistive device to another chair, sit and repeat.   - 1 x daily - 7 x weekly - 2 sets - 10 reps - Standing March with Counter Support  - 1 x daily - 7 x weekly - 3  sets - 10 reps - Standing in "bowling stance" with counter support   - 1 x daily - 7 x weekly - 3 sets - 10 reps - Heel Raises with Counter Support  - 1 x daily - 7 x weekly - 3 sets - 10 reps - Seated shoulder row while sitting on stability disc   - 1 x daily - 7 x weekly - 3 sets - 10 reps    GOALS: Goals reviewed with patient? Yes  SHORT TERM GOALS: Target date: 01/11/2022  Pt will be independent with HEP in order to improve strength and balance in order to decrease fall risk and improve function at home and work.  Baseline: Patient reports not performing much in the way of any HEP except walking. 12/27: Having phases of participation and phases of refusal to participate.  Goal status: IN PROGRESS  LONG TERM GOALS: Target date: 02/22/2022  Pt will decrease 5TSTS by at least 5 seconds in order to demonstrate clinically significant improvement in LE strength. Baseline: 02/28/2022: 19.5 sec with UE support  04/13/22:11.4 sec  Goal status: IN PROGRESS- continue to monitor for consistent progression   2.  Pt will improve FOTO to target score of 48 to display perceived improvements in ability to complete ADL's.  Baseline: 02/28/22=40 12/27: 45 Goal status: IN PROGRESS  3.  Pt will decrease TUG to < or = to 25 seconds/decrease in order to demonstrate decreased fall risk. Baseline: 02/28/22= 37.26 sec using upright 4WW 12/27:20.21 sec  Goal status: IN PROGRESS Keep assessing to ensure progress is maintained   4.  Pt will increase 10MWT by at least 0.13 m/s in order to demonstrate clinically significant improvement in community ambulation.   Baseline: 02/28/22= 0.67 m/s using upright 4WW 12/27: .89 m/s  Goal status: MET  5.  Patient will increase six minute walk test distance to >1000 for progression to community ambulator and improve gait ability  Baseline: 02/28/22: 720 feet with up walker 04/13/22: 815 feet with up walker  Goal status: IN PROGRESS  6.  Patient will experience 1 fall  or less per 2 week period for at least 3 weeks in order to improve his frequency of falls and prevent injury from falls  Baseline:04/13/22: Pt has had 1 fall in the last 2 weeks to his knowledge Goal status: INITIAL  ASSESSMENT:  CLINICAL IMPRESSION:  Patient presents to physical therapy with excellent motivation for completion of physical therapy activities.  Patient challenged with dynamic balance activities including shooting baskets, stepping on and off [employer] pads, completing speed back boxing activities all having to recall walker between stations and with blaze pods on a random setting.The Blaze Pod Random setting was chosen to enhance cognitive processing and agility, providing an unpredictable environment to simulate real-world scenarios, and fostering quick reactions and adaptability. The patient demonstrated significant progress while utilizing [employer], showcasing improved coordination, balance, and cognitive function. The incorporation of dual-tasking technology with color recognition and association with specific movements in Blaze Pods was strategically chosen to provide a dynamic training environment, enabling the patient to engage in simultaneous physical and cognitive tasks. This unique approach enhances not only their physical abilities but also fosters increased neural connectivity and mental awareness, contributing to a well-rounded and effective rehabilitation and training experience. Pt will continue to benefit from skilled physical therapy intervention to address impairments, improve QOL, and attain therapy goals.     OBJECTIVE IMPAIRMENTS Abnormal gait, decreased activity tolerance, decreased balance, decreased cognition, decreased coordination, decreased endurance,  decreased mobility, difficulty walking, decreased ROM, decreased strength, decreased safety awareness, hypomobility, impaired perceived functional ability, and pain.   ACTIVITY LIMITATIONS carrying, lifting,  bending, standing, squatting, stairs, transfers, bed mobility, bathing, toileting, dressing, self feeding, hygiene/grooming, and caring for others  PARTICIPATION LIMITATIONS: meal prep, cleaning, laundry, medication management, personal finances, driving, shopping, community activity, and yard work  PERSONAL FACTORS Time since onset of injury/illness/exacerbation are also affecting patient's functional outcome.   REHAB POTENTIAL: Fair    CLINICAL DECISION MAKING: Evolving/moderate complexity  EVALUATION COMPLEXITY: Moderate  PLAN: PT FREQUENCY: 2x/week  PT DURATION: 12 weeks  PLANNED INTERVENTIONS: Therapeutic exercises, Therapeutic activity, Neuromuscular re-education, Balance training, Gait training, Patient/Family education, Self Care, Joint mobilization, Joint manipulation, Stair training, Vestibular training, Canalith repositioning, DME instructions, Dry Needling, Electrical stimulation, Spinal manipulation, Spinal mobilization, Cryotherapy, Moist heat, Manual therapy, and Re-evaluation  PLAN FOR NEXT SESSION: Continue POC, work on speed up when doing blaze obstacle courses.    Particia Lather, PT 04/25/2022, 12:27 PM

## 2022-04-27 ENCOUNTER — Ambulatory Visit: Payer: Medicare PPO

## 2022-04-27 DIAGNOSIS — R2681 Unsteadiness on feet: Secondary | ICD-10-CM | POA: Diagnosis not present

## 2022-04-27 DIAGNOSIS — R296 Repeated falls: Secondary | ICD-10-CM | POA: Diagnosis not present

## 2022-04-27 DIAGNOSIS — M6281 Muscle weakness (generalized): Secondary | ICD-10-CM

## 2022-04-27 DIAGNOSIS — R278 Other lack of coordination: Secondary | ICD-10-CM | POA: Diagnosis not present

## 2022-04-27 DIAGNOSIS — R269 Unspecified abnormalities of gait and mobility: Secondary | ICD-10-CM | POA: Diagnosis not present

## 2022-04-27 DIAGNOSIS — G20A1 Parkinson's disease without dyskinesia, without mention of fluctuations: Secondary | ICD-10-CM | POA: Diagnosis not present

## 2022-04-27 DIAGNOSIS — R262 Difficulty in walking, not elsewhere classified: Secondary | ICD-10-CM | POA: Diagnosis not present

## 2022-04-27 NOTE — Therapy (Signed)
OUTPATIENT OCCUPATIONAL THERAPY NEURO TREATMENT NOTE  Patient Name: [name] MRN: [id number] DOB:01/06/1951, 72 y.o., male Today's Date: 04/25/2022  PCP: Dr. Elsie Stain REFERRING PROVIDER: Dr. Elsie Stain   OT End of Session - 04/20/22 1331     Visit Number 11    Number of Visits 24    Date for OT Re-Evaluation 05/23/22    Authorization Time Period Reporting period beginning 04/13/2022    OT Start Time 1015   OT Stop Time 1100   OT Time Calculation (min) 45 min    Activity Tolerance Patient tolerated treatment well    Behavior During Therapy [facility] for tasks assessed/performed               Past Medical History:  Diagnosis Date   Arthritis    Bradycardia    Cancer ([place]) 2013   skin cancer   Depression    ptsd   Dysrhythmia    chronic slow heart rate   GERD (gastroesophageal reflux disease)    Headache(784.0)    tension headaches non recent   History of chicken pox    History of kidney stones    passed   Hypertension    treated with HCTZ   Pacemaker    Parkinson's disease    dx'ed 15 years ago   PTSD (post-traumatic stress disorder)    Shortness of breath dyspnea    Sleep apnea    doesn't use C-pap   Varicose veins    Past Surgical History:  Procedure Laterality Date   CHOLECYSTECTOMY N/A 10/22/2014   Procedure: LAPAROSCOPIC CHOLECYSTECTOMY WITH INTRAOPERATIVE CHOLANGIOGRAM;  Surgeon: Dia Crawford III, MD;  Location: [facility] ORS;  Service: General;  Laterality: N/A;   cyst removed      from lip as a child   INTRAMEDULLARY (IM) NAIL INTERTROCHANTERIC N/A 02/18/2019   Procedure: INTRAMEDULLARY (IM) NAIL INTERTROCHANTRIC, RIGHT,;  Surgeon: Thornton Park, MD;  Location: [facility] ORS;  Service: Orthopedics;  Laterality: N/A;   LUMBAR LAMINECTOMY/DECOMPRESSION MICRODISCECTOMY Bilateral 12/14/2012   Procedure: Bilateral lumbar three-four, four-five decompressive laminotomy/foraminotomy;  Surgeon: Charlie Pitter, MD;  Location: [place] NEURO ORS;  Service: Neurosurgery;   Laterality: Bilateral;   PULSE GENERATOR IMPLANT Bilateral 12/13/2013   Procedure: Bilateral implantable pulse generator placement;  Surgeon: Erline Levine, MD;  Location: [place] NEURO ORS;  Service: Neurosurgery;  Laterality: Bilateral;  Bilateral implantable pulse generator placement   skin cancer removed     from ears,   12 lft arm  rt leg 15   SUBTHALAMIC STIMULATOR BATTERY REPLACEMENT Bilateral 07/14/2017   Procedure: BILATERAL IMPLANTED PULSE GENERATOR CHANGE FOR DEEP BRAIN STIMULATOR;  Surgeon: Erline Levine, MD;  Location: [place];  Service: Neurosurgery;  Laterality: Bilateral;   SUBTHALAMIC STIMULATOR INSERTION Bilateral 12/06/2013   Procedure: SUBTHALAMIC STIMULATOR INSERTION;  Surgeon: Erline Levine, MD;  Location: [place] NEURO ORS;  Service: Neurosurgery;  Laterality: Bilateral;  Bilateral deep brain stimulator placement   Patient Active Problem List   Diagnosis Date Noted   Hematuria 10/10/2021   Sinus drainage 12/09/2020   Medicare annual wellness visit, subsequent 11/18/2020   Pacemaker    Dementia associated with Parkinson's disease ([place]) 06/17/2020   Vertigo 02/19/2020   Urine abnormality 02/19/2020   Dysphagia 02/23/2019   Fall at home 10/18/2017   REM behavioral disorder 11/02/2016   Radicular pain in right arm 10/09/2016   GERD (gastroesophageal reflux disease) 07/31/2016   Colon cancer screening 07/23/2015   Gout 02/18/2015   Depression 01/06/2015   Skin lesion 10/02/2014   Dupuytren's  contracture 08/20/2014   Cough 07/21/2014   Lumbar stenosis with neurogenic claudication 06/13/2014   S/P deep brain stimulator placement 05/08/2014   Advance care planning 01/19/2014   Parkinson's disease 12/13/2013   PTSD (post-traumatic stress disorder) 06/13/2013   Erectile dysfunction 06/13/2013   HLD (hyperlipidemia) 06/13/2013   Essential hypertension 06/03/2013   Bradycardia by electrocardiogram 06/03/2013   Obstructive sleep apnea 03/12/2013    ONSET DATE: 2015  REFERRING  DIAG: Parkinson's Disease  THERAPY DIAG:  Muscle weakness (generalized)  Other lack of coordination  Rationale for Evaluation and Treatment: Rehabilitation  SUBJECTIVE: Pt reports he and his wife will be attending a visitation later today for a church friend who passed over the weekend.   SUBJECTIVE STATEMENT: Pt accompanied by:  caregiver [name])  PERTINENT HISTORY: Parkinson's Disease, dementia, DBS, multiple falls  PRECAUTIONS: Fall  WEIGHT BEARING RESTRICTIONS: No  PAIN:  Are you having pain? Yes: NPRS scale: 4/10 Pain location: hips and low back, R knee Pain description: sharp, tight Aggravating factors: if pt crosses feet too long/awkward positioning Relieving factors: voltaren gel   FALLS: Has patient fallen in last 6 months? Yes. Number of falls : spouse estimates 20  LIVING ENVIRONMENT: Lives with: lives with their spouse Lives in: house, 1 level Stairs: No Has following equipment at home: [place] entry, handicapped accessible bathroom  PLOF: Needs assistance with ADLs  PATIENT GOALS: increase bilat hand coordination for ADLs  OBJECTIVE:   HAND DOMINANCE: Right  ADLs: Overall ADLs: caregiver assist for all ADLs; pt currently as 16 hrs of caregiver assist per week through the [place].  Pt recently was assigned 100% disability by the [place], and spouse reports caregiver hours will increase to 46 per week, but uncertain when Transfers/ambulation related to ADLs: supv; pt uses upright 4 wheeled walker Eating: caregivers help to cut food Grooming: mod A to shave with an electric razor UB Dressing: min A, difficulty with buttons LB Dressing: mod A, difficulty with zippers and buttons Toileting: occasional accidents with bowel and bladder, but less than weekly and only during the night; distant supv for toileting during the day Bathing: min A for bathing, mod A for drying off Tub Shower transfers: min guard for shower transfers with grab bar and shower chair Equipment:   fully handicapped shower and toilet  IADLs: Shopping: dep on caregiver Light housekeeping: dep Meal Prep: dep Community mobility: short distance community amb with supv and 4 wheeled walker Medication management: dep Financial management: dep Handwriting:  printed first/last name 50% legible, cursive first/last name illegible  MOBILITY STATUS: Hx of falls  POSTURE COMMENTS:  rounded shoulders, forward head, increased thoracic kyphosis, and flexed trunk   FUNCTIONAL OUTCOME MEASURES: FOTO: 40.2 (completed with PT); predicted 48  UPPER EXTREMITY ROM:   Pt demos BUE shoulder ROM WFL for self care  UPPER EXTREMITY MMT:     MMT Right eval Left eval  Shoulder flexion 5 5  Shoulder abduction 5 5  Shoulder adduction    Shoulder extension    Shoulder internal rotation    Shoulder external rotation    Middle trapezius    Lower trapezius    Elbow flexion 5 5  Elbow extension 5 5  Wrist flexion 5 5  Wrist extension 5 5  Wrist ulnar deviation    Wrist radial deviation    Wrist pronation    Wrist supination    (Blank rows = not tested)  HAND FUNCTION: Grip strength: Right: 61 lbs; Left: 61 lbs, Lateral pinch: Right: 20  lbs, Left: 19 lbs, and 3 point pinch: Right: 11 lbs, Left: 15 lbs 04/13/22: Grip strength: Right: 66 lbs; Left: 65 lbs, Lateral pinch: Right: 18 lbs, Left: 18 lbs, and 3 point pinch: Right: 14 lbs, Left: 15 lbs  COORDINATION: Finger Nose Finger test: increased time, L side touched lower than tip, corrected with min vc 9 Hole Peg test: Right: 1 min 18 sec; Left: 41 sec 04/13/22: 9 Hole Peg test: Right: 1 min 4 sec; Left: 37 sec   SENSATION: Proprioception: Impaired   EDEMA: none  COGNITION: Overall cognitive status: History of cognitive impairments - at baseline; Parkinson's with dementia  VISION: Wears prism glasses; hx of visual hallucinations  PRAXIS: Impaired: Motor planning; bradykinesia, R>L  TODAY'S TREATMENT:                                                                                                                                Therapeutic Exercise: Clothespins  Neuro re-ed: [place] discs  PATIENT EDUCATION: Education details: B FMC/dexterity skills Person educated: Patient, Spouse, and Caregiver , [name] Education method: Explanation Education comprehension: verbalized understanding  HOME EXERCISE PROGRAM: To be established in the next 1-2 sessions   GOALS: Goals reviewed with patient? Yes  SHORT TERM GOALS: Target date: 04/11/22  1. Pt will complete HEP for increasing [place] and strength in bilat hands with direct supv at least 4 of 7 days per week. Baseline: not yet initiated; 04/03/22: caregiver reports pt has putty but rarely uses Goal status: ongoing  2. Pt will clean his glasses with min A and mod vc, including opening cleaning packet.   Baseline: max A; 04/13/22: able to clean with set up/supv with min vc.   Goal status: achieved   LONG TERM GOALS: Target date: 05/23/22  Pt will increase FOTO score by 5 or more points to indicate increase in self/caregiver perceived functional performance with daily tasks.  Baseline: 40.2 (predicted 48);  Goal status: INITIAL  2.  Pt will increase R/L lateral pinch strength by 4 or more lbs to enable pt to independently turn on electric toothbrush and twist open toothpaste. Baseline: R 20, L 19; caregiver assist to turn on toothbrush and open toothpaste; 04/13/22: B 18 lbs, pt still struggles with above activities Goal status: INITIAL  3.  Pt will increase [place] in bilat hands to manage buttons and zippers on clothing 75% of the time.  Baseline: caregivers assist with clothing fasteners at least 50% of the time; 04/13/22: caregiver reports pt requires assist to set up zipper on coat, and pt can manage buttons on jeans and zipper, occasionally having to lie flat on back to manage fasteners.  Pt can manage buttons with extra time on shirt (9 hole: R 1 min 4 sec, L 37  sec) Goal status: ongoing  4.  Pt will scoop food from plate/bowl with min vc using adapted utensils and adapted plates/bowls as needed.  Baseline: caregiver constantly turns plate to  compensate for pt pushing food to 1 side; 04/13/22: pt now uses divided plate which allows pt to scoop his food without having spouse turn his plate or bowl.  Pt continues to use built up eating utensils. Goal status: achieved  5.  Pt will shave with electric razor with min A for thoroughness. Baseline: pt struggles to grade sufficient pressure to thoroughly shave face, requiring mod A; 04/13/22: Spouse is waiting for a nicer razor from the [place], so last few times barber will shave him when he's getting a hair cut.   Goal status: ongoing  ASSESSMENT:  CLINICAL IMPRESSION: Pt tolerated therapeutic exercises and neuro re-ed activities well this date.  When practicing bilat hand coordination skills, L hand tends to lag slightly behind the R, but pt responded well to vc to increase attention to L hand speed.  Pt tolerates 23.4# of resistance on the R hand, and 1 level down on the L hand at 17.9#, requiring occasional cues for technique to reduce dropped pegs.  Pt. continues to benefit from OT services to maximize hand strength and coordination for daily tasks, and provide strategies to maximize indep with ADLs and leisure activities to increase QOL.  PERFORMANCE DEFICITS: in functional skills including ADLs, IADLs, coordination, dexterity, proprioception, strength, pain, Fine motor control, Gross motor control, mobility, balance, body mechanics, endurance, decreased knowledge of use of DME, and UE functional use, cognitive skills including attention, learn, memory, orientation, problem solving, safety awareness, thought, and understand. IMPAIRMENTS: are limiting patient from ADLs, IADLs, rest and sleep, leisure, and social participation.   CO-MORBIDITIES: has co-morbidities such as dementia  that affects occupational  performance. Patient will benefit from skilled OT to address above impairments and improve overall function.  MODIFICATION OR ASSISTANCE TO COMPLETE EVALUATION: Min-Moderate modification of tasks or assist with assess necessary to complete an evaluation.  OT OCCUPATIONAL PROFILE AND HISTORY: Problem focused assessment: Including review of records relating to presenting problem.  CLINICAL DECISION MAKING: Moderate - several treatment options, min-mod task modification necessary  REHAB POTENTIAL: Good  EVALUATION COMPLEXITY: Moderate    PLAN:  OT FREQUENCY: 2x/week  OT DURATION: 12 weeks  PLANNED INTERVENTIONS: self care/ADL training, therapeutic exercise, therapeutic activity, neuromuscular re-education, manual therapy, balance training, functional mobility training, moist heat, cryotherapy, patient/family education, cognitive remediation/compensation, energy conservation, coping strategies training, and DME and/or AE instructions  RECOMMENDED OTHER SERVICES: N/A  CONSULTED AND AGREED WITH PLAN OF CARE: Patient and family member/caregiver  PLAN FOR NEXT SESSION: see eval  Leta Speller, MS, OTR/L  Darleene Cleaver, OT 04/25/2022, 4:58 PM

## 2022-05-02 ENCOUNTER — Ambulatory Visit: Payer: Medicare PPO | Admitting: Physical Therapy

## 2022-05-02 ENCOUNTER — Ambulatory Visit: Payer: Medicare PPO

## 2022-05-03 ENCOUNTER — Ambulatory Visit (INDEPENDENT_AMBULATORY_CARE_PROVIDER_SITE_OTHER): Payer: Medicare PPO | Admitting: Psychology

## 2022-05-03 DIAGNOSIS — F4323 Adjustment disorder with mixed anxiety and depressed mood: Secondary | ICD-10-CM | POA: Diagnosis not present

## 2022-05-03 DIAGNOSIS — F331 Major depressive disorder, recurrent, moderate: Secondary | ICD-10-CM

## 2022-05-04 ENCOUNTER — Encounter: Payer: Self-pay | Admitting: Physical Therapy

## 2022-05-04 ENCOUNTER — Ambulatory Visit: Payer: Medicare PPO

## 2022-05-04 ENCOUNTER — Ambulatory Visit: Payer: Medicare PPO | Admitting: Physical Therapy

## 2022-05-04 DIAGNOSIS — R262 Difficulty in walking, not elsewhere classified: Secondary | ICD-10-CM | POA: Diagnosis not present

## 2022-05-04 DIAGNOSIS — M6281 Muscle weakness (generalized): Secondary | ICD-10-CM | POA: Diagnosis not present

## 2022-05-04 DIAGNOSIS — R269 Unspecified abnormalities of gait and mobility: Secondary | ICD-10-CM | POA: Diagnosis not present

## 2022-05-04 DIAGNOSIS — R296 Repeated falls: Secondary | ICD-10-CM | POA: Diagnosis not present

## 2022-05-04 DIAGNOSIS — G20A1 Parkinson's disease without dyskinesia, without mention of fluctuations: Secondary | ICD-10-CM | POA: Diagnosis not present

## 2022-05-04 DIAGNOSIS — R2681 Unsteadiness on feet: Secondary | ICD-10-CM

## 2022-05-04 DIAGNOSIS — R278 Other lack of coordination: Secondary | ICD-10-CM

## 2022-05-04 NOTE — Progress Notes (Addendum)
[facility] Counselor/Therapist Progress Note  Patient ID: [name], MRN: [id number],    Date: 05/03/2022  Time Spent: 45 minutes  Treatment Type: Indivdual  Reported Symptoms: sadness, and boredom  Mental Status Exam: Appearance:  Casual     Behavior: normal  Motor: Psychomotor Retardation  Speech/Language:  Slow  Affect: flat  Mood: normal  Thought process: concrete  Thought content:   WNL  Sensory/Perceptual disturbances:   WNL  Orientation: oriented to person, place, time/date, and situation  Attention: Fair  Concentration: Fair  Memory: Immediate;   [place] of knowledge:  Fair  Insight:   Fair  Judgment:  Fair  Impulse Control: Fair   Risk Assessment: Danger to Self:  No Self-injurious Behavior: No Danger to Others: No Duty to Warn:no Physical Aggression / Violence:No  Access to Firearms a concern: No  Gang Involvement:No   Subjective: The patient attended a face-to-face individual therapy session  via video visit today.  The patient gave verbal consent for the video to be on WebEx.  The patient was in his home  and therapist was in the office.  The patient presents with a flat affect and mood is pleasant.  The patient reports that he feels like he is doing okay.  He states that he does have some sadness but he does not feel like it is very bad.  The patient reports that he had a good holiday and did get to see his grandchildren.  He said he only got missed one over the holiday.  The patient continues to be at home and has caregivers coming in 5 days a week.  He and his wife are planning a trip to [place] on February 5 and this seems like a good thing for them.  We talked about what he is planning to do and that that would be a good distraction for him.  But the patient struggles with depression because he cannot do what he previously did before and his wife does a good job of trying to have him involved in lots of activities so that he is  not quite as bored or sad.  We continue to meet every 3 weeks at their request because he feels like the therapy is helpful.   Interventions: Ego-Supportive, CBT  Diagnosis:Major depressive disorder, recurrent episode, moderate (HCC)  Adjustment disorder with mixed anxiety and depressed mood  Plan: Client Abilities/Strengths  Pt is bright and has a good sense of humor.  Client Treatment Preferences  Individual therapy.  Client Statement of Needs  improve coping skills regarding chronic illness.  Treatment Level  Outpatient Individual therapy  Symptoms  Depressed or irritable mood.: No Description Entered (Status: improved). Diminished interest in or  enjoyment of activities.: No Description Entered (Status: maintained). Feelings of hopelessness,  worthlessness, or inappropriate guilt.: No Description Entered (Status: improved). Lack of energy.: No  Description Entered (Status: maintained). Psychomotor agitation or retardation.: No Description  Entered (Status: maintained). Social withdrawal.: No Description Entered (Status: improved).  Problems Addressed  Unipolar Depression, Unipolar Depression, Unipolar Depression, Unipolar Depression  Goals 1. Alleviate depressive symptoms and return to previous level of effective  functioning. 2. Develop healthy interpersonal relationships that lead to the alleviation  and help prevent the relapse of depression. Objective Describe current and past experiences with depression including their impact on functioning and  attempts to resolve it. Target Date: 2023-05-01 Frequency: Monthly Progress: 60 Modality: individual Related Interventions 1. Encourage the client to share his/her thoughts  and feelings of depression; express empathy and  build rapport while identifying primary cognitive, behavioral, interpersonal, or other  contributors to depression. Objective Learn and implement behavioral strategies to overcome depression. Target Date:  2023-05-01 Frequency: Monthly Progress: 80 Modality: individual Objective Identify and replace thoughts and beliefs that support depression. Target Date: 2023-05-01 Frequency: Monthly Progress: 70 Modality: individual Related Interventions 1. Conduct Cognitive-Behavioral Therapy (see Cognitive Behavior Therapy by Olevia Bowens; Overcoming Depression by Lynita Lombard al.), beginning with helping the client learn the connection among  cognition, depressive feelings, and actions. 2. Explore and restructure underlying assumptions and beliefs reflected in biased self-talk that  may put the client at risk for relapse or recurrence. 3. Facilitate and reinforce the client's shift from biased depressive self-talk and beliefs to realitybased cognitive messages that enhance self-confidence and increase adaptive actions (see  "Positive Self-Talk" in the Adult Psychotherapy Homework Planner by Bryn Gulling). Objective Learn and implement problem-solving and decision-making skills. Target Date: 2023-05-01 Frequency: Monthly Progress: 70 Modality: individual Related Interventions 1. Encourage in the client the development of a positive problem orientation in which problems  and solving them are viewed as a natural part of life and not something to be feared, despaired,  or avoided. 2. Conduct Problem-Solving Therapy (see Problem-Solving Therapy by Shawnee Knapp and Delane Ginger) using techniques such as psychoeducation, modeling, and role-playing to teach client problem-solving skills (i.e., defining a problem specifically, generating possible solutions, evaluating the pros  and cons of each solution, selecting and implementing a plan of action, evaluating the efficacy  of the plan, accepting or revising the plan); role-play application of the problem-solving skill to  a real life issue (or assign "Applying Problem-Solving to Interpersonal Conflict" in the Adult  Psychotherapy Homework Planner by Bryn Gulling). 3. Develop healthy  thinking patterns and beliefs about self, others, and the world that lead to the alleviation and help prevent the relapse of  depression. 4. Recognize, accept, and cope with feelings of depression. Diagnosis Axis  none F43.21 (Adjustment Disorder, With depressed mood) - Open -  [Signifier: n/a]  Adjustment Disorder,  With Depressed Mood  Axis  none 296.32 (Major depressive affective disorder, recurrent episode,  moderate) - Open - [Signifier: n/a]  Conditions For Discharge Achievement of treatment goals and objectives   Plan is to continue to offer supportive therapy to patient to help him deal with his depression related to his Parkinsons diagnosis.  Patient approved Treatment Plan   Darren Caldron Rejeana Brock, LCSW

## 2022-05-04 NOTE — Therapy (Signed)
OUTPATIENT PHYSICAL THERAPY NEURO Treatment     Patient Name: [name] MRN: [id number] DOB:[birth year], 72 y.o., male Today's Date: 05/04/2022   PCP: Dr. Elsie Stain REFERRING PROVIDER: Dr. Sharolyn Douglas   PT End of Session - 05/04/22 1435     Visit Number 13    Number of Visits 24    Date for PT Re-Evaluation 05/23/22    Authorization Type [employer] Choice PPO    Authorization Time Period Cert through 09/19/99    Progress Note Due on Visit 20    PT Start Time 1145    PT Stop Time 1228    PT Time Calculation (min) 43 min    Equipment Utilized During Treatment Gait belt    Activity Tolerance Patient tolerated treatment well    Behavior During Therapy WFL for tasks assessed/performed                       Past Medical History:  Diagnosis Date   Arthritis    Bradycardia    Cancer ([place]) 2013   skin cancer   Depression    ptsd   Dysrhythmia    chronic slow heart rate   GERD (gastroesophageal reflux disease)    Headache(784.0)    tension headaches non recent   History of chicken pox    History of kidney stones    passed   Hypertension    treated with HCTZ   Pacemaker    Parkinson's disease    dx'ed 15 years ago   PTSD (post-traumatic stress disorder)    Shortness of breath dyspnea    Sleep apnea    doesn't use C-pap   Varicose veins    Past Surgical History:  Procedure Laterality Date   CHOLECYSTECTOMY N/A 10/22/2014   Procedure: LAPAROSCOPIC CHOLECYSTECTOMY WITH INTRAOPERATIVE CHOLANGIOGRAM;  Surgeon: Dia Crawford III, MD;  Location: [facility];  Service: General;  Laterality: N/A;   cyst removed      from lip as a child   INTRAMEDULLARY (IM) NAIL INTERTROCHANTERIC N/A 02/18/2019   Procedure: INTRAMEDULLARY (IM) NAIL INTERTROCHANTRIC, RIGHT,;  Surgeon: Thornton Park, MD;  Location: [facility];  Service: Orthopedics;  Laterality: N/A;   LUMBAR LAMINECTOMY/DECOMPRESSION MICRODISCECTOMY Bilateral 12/14/2012   Procedure: Bilateral lumbar  three-four, four-five decompressive laminotomy/foraminotomy;  Surgeon: Charlie Pitter, MD;  Location: [place] [facility];  Service: Neurosurgery;  Laterality: Bilateral;   PULSE GENERATOR IMPLANT Bilateral 12/13/2013   Procedure: Bilateral implantable pulse generator placement;  Surgeon: Erline Levine, MD;  Location: [place] [facility];  Service: Neurosurgery;  Laterality: Bilateral;  Bilateral implantable pulse generator placement   skin cancer removed     from ears,   12 lft arm  rt leg 15   SUBTHALAMIC STIMULATOR BATTERY REPLACEMENT Bilateral 07/14/2017   Procedure: BILATERAL IMPLANTED PULSE GENERATOR CHANGE FOR DEEP BRAIN STIMULATOR;  Surgeon: Erline Levine, MD;  Location: [place];  Service: Neurosurgery;  Laterality: Bilateral;   SUBTHALAMIC STIMULATOR INSERTION Bilateral 12/06/2013   Procedure: SUBTHALAMIC STIMULATOR INSERTION;  Surgeon: Erline Levine, MD;  Location: [place] [facility];  Service: Neurosurgery;  Laterality: Bilateral;  Bilateral deep brain stimulator placement   Patient Active Problem List   Diagnosis Date Noted   Hematuria 10/10/2021   Sinus drainage 12/09/2020   Medicare annual wellness visit, subsequent 11/18/2020   Pacemaker    Dementia associated with Parkinson's disease ([place]) 06/17/2020   Vertigo 02/19/2020   Urine abnormality 02/19/2020   Dysphagia 02/23/2019   Fall at home 10/18/2017   REM behavioral  disorder 11/02/2016   Radicular pain in right arm 10/09/2016   GERD (gastroesophageal reflux disease) 07/31/2016   Colon cancer screening 07/23/2015   Gout 02/18/2015   Depression 01/06/2015   Skin lesion 10/02/2014   Dupuytren's contracture 08/20/2014   Cough 07/21/2014   Lumbar stenosis with neurogenic claudication 06/13/2014   S/P deep brain stimulator placement 05/08/2014   Advance care planning 01/19/2014   Parkinson's disease 12/13/2013   PTSD (post-traumatic stress disorder) 06/13/2013   Erectile dysfunction 06/13/2013   HLD (hyperlipidemia) 06/13/2013   Essential  hypertension 06/03/2013   Bradycardia by electrocardiogram 06/03/2013   Obstructive sleep apnea 03/12/2013    ONSET DATE: 15 years ago per chart  REFERRING DIAG: Parkinson's disease  THERAPY DIAG:  Unsteadiness on feet  Repeated falls  Abnormality of gait and mobility  Difficulty in walking, not elsewhere classified  Muscle weakness (generalized)  Parkinson's disease without dyskinesia or fluctuating manifestations  Rationale for Evaluation and Treatment Rehabilitation  SUBJECTIVE:                                                                                                                                                                                              SUBJECTIVE STATEMENT: Pt presents with new u step walker and requests some adjustments to improve posture and use of walker. Pt reports no new falls since last session that he or [name]  (caregiver) can recall.  Pt accompanied by: significant other and [name] (caretaker)  PERTINENT HISTORY:   Date of most recent Admission: 09/25/2021 Date of Discharge: 09/28/2021  Discharge Diagnoses:  Altered mental status Parkinson disease (CMS/HCC) Hallucinations  Per [facility] discharge summary notes PHI : Hospital Course:   72 y.o. male with a history of Parkinson's Disease w/ DBS in place, PD-related dementia, OSA, depression, and seizure disorder who presented for EEG given concern for seizures.  Altered mental status Concern seizure activity Periods of unresponsiveness on the setting of new medication--Nuplazid (pimavanserin) recently started. No events of note on continuous EEG. This was held and he returned to his baseline mental status. Other workup including CT head, CTA h/n without obvious cause for periodic unresponsiveness. Nuplazid was discontinued.   Aspiration, weakness Of note, he had a coughing spell while eating though sounds like was precipitated by sneezing. Did well on RA, seen by SLP without any  further needs or concerns.  Parkinson's Disease Parkinson's-related dementia - He follows in the Movement Disorders center at [facility]. Clinic notes suggest he seems to have worsening of his overall disease trajectory. - Continued home Memantine and Sinemet      PMH: HTN, bradycardia s/p PPM, PD, DBS,  COVID-19 infection, chronic low back pain s/p L4/5 surgery.  PAIN:  Are you having pain? Yes: NPRS scale: 7/10 Pain location: Low back Pain description: ache Aggravating factors: Prolonged standing/sitting, bending over Relieving factors: rest, changing positions  PRECAUTIONS: Fall  WEIGHT BEARING RESTRICTIONS No  FALLS: Has patient fallen in last 6 months? Yes. Number of falls >10  LIVING ENVIRONMENT: Lives with: lives with their spouse Lives in: House/apartment Stairs:  Ramp Has following equipment at home: Single point cane, Environmental consultant - 4 wheeled, Wheelchair (manual), shower chair, bed side commode, Grab bars, and Ramped entry  PLOF: Needs assistance with ADLs, Needs assistance with homemaking, Needs assistance with gait, and Needs assistance with transfers  PATIENT GOALS To walk better and not fall. Improve fall frequency to prevent significant injury   OBJECTIVE: (objective measures completed at initial evaluation unless otherwise dated)   DIAGNOSTIC FINDINGS:   COGNITION: Overall cognitive status: History of cognitive impairments - at baseline   SENSATION: Light touch: WFL  COORDINATION: Decreased and slow to initiate  EDEMA:  None observed     POSTURE: rounded shoulders, forward head, increased thoracic kyphosis, and flexed trunk      TRANSFERS: Assistive device utilized:  Upright 4WW   Sit to stand:  Min/mod assist- with increased VC for safety Stand to sit:  Min assist with Verbal cues to lock brakes and reach back Chair to chair: Min A     GAIT: Gait pattern: decreased arm swing- Right, decreased arm swing- Left, decreased step length- Right,  decreased step length- Left, decreased stance time- Right, decreased stance time- Left, decreased stride length, and shuffling Distance walked: 150 ft. Assistive device utilized:  upright 4WW Level of assistance: Min A Comments: Shuffling gait with slow gait speed  FUNCTIONAL TESTs:  5 times sit to stand: 19.5 sec with UE support with BUE support Timed up and go (TUG): 37.26 sec with upright 4WW 6 minute walk test: 720 feet using upright 4WW 10 meter walk test: 0.67 m/s  PATIENT SURVEYS:  FOTO 50  TODAY'S TREATMENT:  05/04/22  Self care/ Home management:   Initial phase of treatment session was used modifying Walker's patient had better fit.  Patient presents with new U step walker this session.  Patient unable to hold onto walker handles all also resting his elbows in the elbow wrist section of the walker secondary to elbow section being too close to hand holds.  Physical therapist adjusted this and double check to ensure was secure.  Following this patient able to better ambulate with walker and patient caregiver [name] reports that he is walking close to the walker rather than far away from it and hunched over.  Patient performs notable sit to stands as well as multiple ambulatory bouts in order to ensure walker suggested properly.   NMR Obstacle challenge with 3 stations ( STS, speed bag boxing, and basketball shot) -x 3 to speed bag and basketballshot and x 6 to STS  -Second round added challenge to rush between stations causing increased error of leaving walker or not performing good form with STS. Pt made a few errors with this challenge but no LOB occurred.   Rows seated on dynadisc with GTB 3 x 15   PATIENT EDUCATION: Education details: PT plan of care; purpose of functional outcomes Person educated: Patient and Spouse Education method: Explanation and Verbal cues Education comprehension: verbalized understanding   HOME EXERCISE PROGRAM: Access Code: [email or web address] URL:  [email or web address] Date: 04/20/2022 Prepared by: Rivka Barbara  Exercises -  Sit to stand, walk with assistive device to another chair, sit and repeat.   - 1 x daily - 7 x weekly - 2 sets - 10 reps - Standing March with Counter Support  - 1 x daily - 7 x weekly - 3 sets - 10 reps - Standing in "bowling stance" with counter support   - 1 x daily - 7 x weekly - 3 sets - 10 reps - Heel Raises with Counter Support  - 1 x daily - 7 x weekly - 3 sets - 10 reps - Seated shoulder row while sitting on stability disc   - 1 x daily - 7 x weekly - 3 sets - 10 reps    GOALS: Goals reviewed with patient? Yes  SHORT TERM GOALS: Target date: 01/11/2022  Pt will be independent with HEP in order to improve strength and balance in order to decrease fall risk and improve function at home and work.  Baseline: Patient reports not performing much in the way of any HEP except walking. 12/27: Having phases of participation and phases of refusal to participate.  Goal status: IN PROGRESS  LONG TERM GOALS: Target date: 02/22/2022  Pt will decrease 5TSTS by at least 5 seconds in order to demonstrate clinically significant improvement in LE strength. Baseline: 02/28/2022: 19.5 sec with UE support  04/13/22:11.4 sec  Goal status: IN PROGRESS- continue to monitor for consistent progression   2.  Pt will improve FOTO to target score of 48 to display perceived improvements in ability to complete ADL's.  Baseline: 02/28/22=40 12/27: 45 Goal status: IN PROGRESS  3.  Pt will decrease TUG to < or = to 25 seconds/decrease in order to demonstrate decreased fall risk. Baseline: 02/28/22= 37.26 sec using upright 4WW 12/27:20.21 sec  Goal status: IN PROGRESS Keep assessing to ensure progress is maintained   4.  Pt will increase 10MWT by at least 0.13 m/s in order to demonstrate clinically significant improvement in community ambulation.   Baseline: 02/28/22= 0.67 m/s using upright 4WW 12/27: .89 m/s   Goal status: MET  5.  Patient will increase six minute walk test distance to >1000 for progression to community ambulator and improve gait ability  Baseline: 02/28/22: 720 feet with up walker 04/13/22: 815 feet with up walker  Goal status: IN PROGRESS  6.  Patient will experience 1 fall or less per 2 week period for at least 3 weeks in order to improve his frequency of falls and prevent injury from falls  Baseline:04/13/22: Pt has had 1 fall in the last 2 weeks to his knowledge Goal status: INITIAL  ASSESSMENT:  CLINICAL IMPRESSION:  Patient presents to physical therapy with excellent motivation for completion of physical therapy activities.  Patient challenged with dynamic balance activities including shooting baskets, completing speed back boxing activities all having to recall walker between stations and with blaze pods on a random setting. Pt challenged to increase speed between stations this session to further challenge pt to remember proper form and to recall walker positioning. Will continue with this strategy in future sessions to challenge real life environment where he is rushed between tasks. Pt will continue to benefit from skilled physical therapy intervention to address impairments, improve QOL, and attain therapy goals.     OBJECTIVE IMPAIRMENTS Abnormal gait, decreased activity tolerance, decreased balance, decreased cognition, decreased coordination, decreased endurance, decreased mobility, difficulty walking, decreased ROM, decreased strength, decreased safety awareness, hypomobility, impaired perceived functional ability, and pain.   ACTIVITY LIMITATIONS carrying, lifting, bending,  standing, squatting, stairs, transfers, bed mobility, bathing, toileting, dressing, self feeding, hygiene/grooming, and caring for others  PARTICIPATION LIMITATIONS: meal prep, cleaning, laundry, medication management, personal finances, driving, shopping, community activity, and yard  work  PERSONAL FACTORS Time since onset of injury/illness/exacerbation are also affecting patient's functional outcome.   REHAB POTENTIAL: Fair    CLINICAL DECISION MAKING: Evolving/moderate complexity  EVALUATION COMPLEXITY: Moderate  PLAN: PT FREQUENCY: 2x/week  PT DURATION: 12 weeks  PLANNED INTERVENTIONS: Therapeutic exercises, Therapeutic activity, Neuromuscular re-education, Balance training, Gait training, Patient/Family education, Self Care, Joint mobilization, Joint manipulation, Stair training, Vestibular training, Canalith repositioning, DME instructions, Dry Needling, Electrical stimulation, Spinal manipulation, Spinal mobilization, Cryotherapy, Moist heat, Manual therapy, and Re-evaluation  PLAN FOR NEXT SESSION: Continue POC, work on speed up when doing blaze obstacle courses.    Particia Lather, PT 05/04/2022, 2:36 PM

## 2022-05-04 NOTE — Therapy (Signed)
OUTPATIENT OCCUPATIONAL THERAPY NEURO TREATMENT NOTE  Patient Name: [name] MRN: [id number] DOB:[birth year], 72 y.o., male Today's Date: 05/05/2022  PCP: Dr. Elsie Stain REFERRING PROVIDER: Dr. Elsie Stain   OT End of Session - 05/05/22 0833     Visit Number 14    Number of Visits 24    Date for OT Re-Evaluation 05/23/22    Authorization Time Period Reporting period beginning 04/13/22    OT Start Time 61    OT Stop Time 1145    OT Time Calculation (min) 45 min    Equipment Utilized During Treatment rollator    Activity Tolerance Patient tolerated treatment well    Behavior During Therapy [facility] for tasks assessed/performed                Past Medical History:  Diagnosis Date   Arthritis    Bradycardia    Cancer ([place]) 2013   skin cancer   Depression    ptsd   Dysrhythmia    chronic slow heart rate   GERD (gastroesophageal reflux disease)    Headache(784.0)    tension headaches non recent   History of chicken pox    History of kidney stones    passed   Hypertension    treated with HCTZ   Pacemaker    Parkinson's disease    dx'ed 15 years ago   PTSD (post-traumatic stress disorder)    Shortness of breath dyspnea    Sleep apnea    doesn't use C-pap   Varicose veins    Past Surgical History:  Procedure Laterality Date   CHOLECYSTECTOMY N/A 10/22/2014   Procedure: LAPAROSCOPIC CHOLECYSTECTOMY WITH INTRAOPERATIVE CHOLANGIOGRAM;  Surgeon: Dia Crawford III, MD;  Location: [facility] ORS;  Service: General;  Laterality: N/A;   cyst removed      from lip as a child   INTRAMEDULLARY (IM) NAIL INTERTROCHANTERIC N/A 02/18/2019   Procedure: INTRAMEDULLARY (IM) NAIL INTERTROCHANTRIC, RIGHT,;  Surgeon: Thornton Park, MD;  Location: [facility] ORS;  Service: Orthopedics;  Laterality: N/A;   LUMBAR LAMINECTOMY/DECOMPRESSION MICRODISCECTOMY Bilateral 12/14/2012   Procedure: Bilateral lumbar three-four, four-five decompressive laminotomy/foraminotomy;  Surgeon: Charlie Pitter, MD;   Location: [place] NEURO ORS;  Service: Neurosurgery;  Laterality: Bilateral;   PULSE GENERATOR IMPLANT Bilateral 12/13/2013   Procedure: Bilateral implantable pulse generator placement;  Surgeon: Erline Levine, MD;  Location: [place] NEURO ORS;  Service: Neurosurgery;  Laterality: Bilateral;  Bilateral implantable pulse generator placement   skin cancer removed     from ears,   12 lft arm  rt leg 15   SUBTHALAMIC STIMULATOR BATTERY REPLACEMENT Bilateral 07/14/2017   Procedure: BILATERAL IMPLANTED PULSE GENERATOR CHANGE FOR DEEP BRAIN STIMULATOR;  Surgeon: Erline Levine, MD;  Location: [place];  Service: Neurosurgery;  Laterality: Bilateral;   SUBTHALAMIC STIMULATOR INSERTION Bilateral 12/06/2013   Procedure: SUBTHALAMIC STIMULATOR INSERTION;  Surgeon: Erline Levine, MD;  Location: [place] NEURO ORS;  Service: Neurosurgery;  Laterality: Bilateral;  Bilateral deep brain stimulator placement   Patient Active Problem List   Diagnosis Date Noted   Hematuria 10/10/2021   Sinus drainage 12/09/2020   Medicare annual wellness visit, subsequent 11/18/2020   Pacemaker    Dementia associated with Parkinson's disease ([place]) 06/17/2020   Vertigo 02/19/2020   Urine abnormality 02/19/2020   Dysphagia 02/23/2019   Fall at home 10/18/2017   REM behavioral disorder 11/02/2016   Radicular pain in right arm 10/09/2016   GERD (gastroesophageal reflux disease) 07/31/2016   Colon cancer screening 07/23/2015   Gout 02/18/2015  Depression 01/06/2015   Skin lesion 10/02/2014   Dupuytren's contracture 08/20/2014   Cough 07/21/2014   Lumbar stenosis with neurogenic claudication 06/13/2014   S/P deep brain stimulator placement 05/08/2014   Advance care planning 01/19/2014   Parkinson's disease 12/13/2013   PTSD (post-traumatic stress disorder) 06/13/2013   Erectile dysfunction 06/13/2013   HLD (hyperlipidemia) 06/13/2013   Essential hypertension 06/03/2013   Bradycardia by electrocardiogram 06/03/2013   Obstructive sleep apnea  03/12/2013    ONSET DATE: 2015  REFERRING DIAG: Parkinson's Disease  THERAPY DIAG:  Muscle weakness (generalized)  Other lack of coordination  Parkinson's disease without dyskinesia or fluctuating manifestations  Rationale for Evaluation and Treatment: Rehabilitation  SUBJECTIVE: Caregiver reports pt with difficulty managing buttons on jeans in standing.  SUBJECTIVE STATEMENT: Pt accompanied by:  caregiver [name])  PERTINENT HISTORY: Parkinson's Disease, dementia, DBS, multiple falls  PRECAUTIONS: Fall  WEIGHT BEARING RESTRICTIONS: No  PAIN:  Are you having pain? Yes: NPRS scale: 4/10 Pain location: hips and low back, R knee Pain description: sharp, tight Aggravating factors: if pt crosses feet too long/awkward positioning Relieving factors: voltaren gel   FALLS: Has patient fallen in last 6 months? Yes. Number of falls : spouse estimates 20  LIVING ENVIRONMENT: Lives with: lives with their spouse Lives in: house, 1 level Stairs: No Has following equipment at home: [place] entry, handicapped accessible bathroom  PLOF: Needs assistance with ADLs  PATIENT GOALS: increase bilat hand coordination for ADLs  OBJECTIVE:   HAND DOMINANCE: Right  ADLs: Overall ADLs: caregiver assist for all ADLs; pt currently as 16 hrs of caregiver assist per week through the [place].  Pt recently was assigned 100% disability by the [place], and spouse reports caregiver hours will increase to 46 per week, but uncertain when Transfers/ambulation related to ADLs: supv; pt uses upright 4 wheeled walker Eating: caregivers help to cut food Grooming: mod A to shave with an electric razor UB Dressing: min A, difficulty with buttons LB Dressing: mod A, difficulty with zippers and buttons Toileting: occasional accidents with bowel and bladder, but less than weekly and only during the night; distant supv for toileting during the day Bathing: min A for bathing, mod A for drying off Tub Shower transfers:  min guard for shower transfers with grab bar and shower chair Equipment:  fully handicapped shower and toilet  IADLs: Shopping: dep on caregiver Light housekeeping: dep Meal Prep: dep Community mobility: short distance community amb with supv and 4 wheeled walker Medication management: dep Financial management: dep Handwriting:  printed first/last name 50% legible, cursive first/last name illegible  MOBILITY STATUS: Hx of falls  POSTURE COMMENTS:  rounded shoulders, forward head, increased thoracic kyphosis, and flexed trunk   FUNCTIONAL OUTCOME MEASURES: FOTO: 40.2 (completed with PT); predicted 48  UPPER EXTREMITY ROM:   Pt demos BUE shoulder ROM WFL for self care  UPPER EXTREMITY MMT:     MMT Right eval Left eval  Shoulder flexion 5 5  Shoulder abduction 5 5  Shoulder adduction    Shoulder extension    Shoulder internal rotation    Shoulder external rotation    Middle trapezius    Lower trapezius    Elbow flexion 5 5  Elbow extension 5 5  Wrist flexion 5 5  Wrist extension 5 5  Wrist ulnar deviation    Wrist radial deviation    Wrist pronation    Wrist supination    (Blank rows = not tested)  HAND FUNCTION: Grip strength: Right: 61 lbs; Left:  61 lbs, Lateral pinch: Right: 20 lbs, Left: 19 lbs, and 3 point pinch: Right: 11 lbs, Left: 15 lbs 04/13/22: Grip strength: Right: 66 lbs; Left: 65 lbs, Lateral pinch: Right: 18 lbs, Left: 18 lbs, and 3 point pinch: Right: 14 lbs, Left: 15 lbs  COORDINATION: Finger Nose Finger test: increased time, L side touched lower than tip, corrected with min vc 9 Hole Peg test: Right: 1 min 18 sec; Left: 41 sec 04/13/22: 9 Hole Peg test: Right: 1 min 4 sec; Left: 37 sec   SENSATION: Proprioception: Impaired   EDEMA: none  COGNITION: Overall cognitive status: History of cognitive impairments - at baseline; Parkinson's with dementia  VISION: Wears prism glasses; hx of visual hallucinations  PRAXIS: Impaired: Motor  planning; bradykinesia, R>L  TODAY'S TREATMENT:                                                                                                                              Self Care: Caregiver reports pt with difficulty managing buttons on jeans in standing.  Pt participated in management of clothing fasteners in standing to facilitate dual tasking.  Pt required cues with each standing trial to back legs up against chair for increased stability.  Reinforced using this technique at commode and bedside when managing clothes in standing.  Pt able to stand with min guard and intermittent min A to WS back to midline d/t slow shift to the R.  Pt required repeated tactile cues to tighten quads in standing d/t gradual L knee bend.  Pt requiring increased time to manage large and small buttons in standing.  Standing tolerance limited when dual tasking, ~2 min before sitting break needed.  Caregiver reports that on occasion pt will transition to supine on the bed to fasten the button on his pants.  Therapeutic Activity: Facilitated bilat [place] activity shuffling, dealing, flipping cards, working to increase [facility] for daily tasks.  PATIENT EDUCATION: Education details: HEP (list of activities) for caregiver carryover Person educated: caregiver, [name] Education method: Explanation, written handout Education comprehension: verbalized understanding  HOME EXERCISE PROGRAM: To be established in the next 1-2 sessions   GOALS: Goals reviewed with patient? Yes  SHORT TERM GOALS: Target date: 04/11/22  1. Pt will complete HEP for increasing [place] and strength in bilat hands with direct supv at least 4 of 7 days per week. Baseline: not yet initiated; 04/03/22: caregiver reports pt has putty but rarely uses Goal status: ongoing  2. Pt will clean his glasses with min A and mod vc, including opening cleaning packet.   Baseline: max A; 04/13/22: able to clean with set up/supv with min vc.   Goal status:  achieved   LONG TERM GOALS: Target date: 05/23/22  Pt will increase FOTO score by 5 or more points to indicate increase in self/caregiver perceived functional performance with daily tasks.  Baseline: 40.2 (predicted 48);  Goal status: INITIAL  2.  Pt will increase R/L lateral pinch  strength by 4 or more lbs to enable pt to independently turn on electric toothbrush and twist open toothpaste. Baseline: R 20, L 19; caregiver assist to turn on toothbrush and open toothpaste; 04/13/22: B 18 lbs, pt still struggles with above activities Goal status: INITIAL  3.  Pt will increase [place] in bilat hands to manage buttons and zippers on clothing 75% of the time.  Baseline: caregivers assist with clothing fasteners at least 50% of the time; 04/13/22: caregiver reports pt requires assist to set up zipper on coat, and pt can manage buttons on jeans and zipper, occasionally having to lie flat on back to manage fasteners.  Pt can manage buttons with extra time on shirt (9 hole: R 1 min 4 sec, L 37 sec) Goal status: ongoing  4.  Pt will scoop food from plate/bowl with min vc using adapted utensils and adapted plates/bowls as needed.  Baseline: caregiver constantly turns plate to compensate for pt pushing food to 1 side; 04/13/22: pt now uses divided plate which allows pt to scoop his food without having spouse turn his plate or bowl.  Pt continues to use built up eating utensils. Goal status: achieved  5.  Pt will shave with electric razor with min A for thoroughness. Baseline: pt struggles to grade sufficient pressure to thoroughly shave face, requiring mod A; 04/13/22: Spouse is waiting for a nicer razor from the [place], so last few times barber will shave him when he's getting a hair cut.   Goal status: ongoing  ASSESSMENT:  CLINICAL IMPRESSION: Pt participated in management of clothing fasteners in standing to facilitate dual tasking.  Pt required cues with each standing trial to back legs up against chair for  increased stability.  Reinforced using this technique at commode and bedside when managing clothes in standing.  Pt able to stand with min guard and intermittent min A to WS back to midline d/t slow shift to the R.  Pt required repeated tactile cues to tighten quads in standing d/t gradual L knee bend.  Pt requiring increased time to manage large and small buttons in standing.  Standing tolerance limited when dual tasking, ~2 min before sitting break needed.  Caregiver reports that on occasion pt will transition to supine on the bed to fasten the button on his pants.  Will continue to provide dual tasking activities to increase standing tolerance, balance, and activity tolerance for improved indep and decreased fall risk with standing ADLs.  Pt. continues to benefit from OT services to maximize hand strength and coordination for daily tasks, and provide strategies to maximize indep with ADLs and leisure activities to increase QOL.  PERFORMANCE DEFICITS: in functional skills including ADLs, IADLs, coordination, dexterity, proprioception, strength, pain, Fine motor control, Gross motor control, mobility, balance, body mechanics, endurance, decreased knowledge of use of DME, and UE functional use, cognitive skills including attention, learn, memory, orientation, problem solving, safety awareness, thought, and understand. IMPAIRMENTS: are limiting patient from ADLs, IADLs, rest and sleep, leisure, and social participation.   CO-MORBIDITIES: has co-morbidities such as dementia  that affects occupational performance. Patient will benefit from skilled OT to address above impairments and improve overall function.  MODIFICATION OR ASSISTANCE TO COMPLETE EVALUATION: Min-Moderate modification of tasks or assist with assess necessary to complete an evaluation.  OT OCCUPATIONAL PROFILE AND HISTORY: Problem focused assessment: Including review of records relating to presenting problem.  CLINICAL DECISION MAKING:  Moderate - several treatment options, min-mod task modification necessary  REHAB POTENTIAL: Good  EVALUATION  COMPLEXITY: Moderate    PLAN:  OT FREQUENCY: 2x/week  OT DURATION: 12 weeks  PLANNED INTERVENTIONS: self care/ADL training, therapeutic exercise, therapeutic activity, neuromuscular re-education, manual therapy, balance training, functional mobility training, moist heat, cryotherapy, patient/family education, cognitive remediation/compensation, energy conservation, coping strategies training, and DME and/or AE instructions  RECOMMENDED OTHER SERVICES: N/A  CONSULTED AND AGREED WITH PLAN OF CARE: Patient and family member/caregiver  PLAN FOR NEXT SESSION: see eval  Leta Speller, MS, OTR/L  Darleene Cleaver, OT 05/05/2022, 8:36 AM

## 2022-05-05 ENCOUNTER — Ambulatory Visit (INDEPENDENT_AMBULATORY_CARE_PROVIDER_SITE_OTHER): Payer: Medicare PPO

## 2022-05-05 VITALS — Wt 186.0 lb

## 2022-05-05 DIAGNOSIS — Z0000 Encounter for general adult medical examination without abnormal findings: Secondary | ICD-10-CM | POA: Diagnosis not present

## 2022-05-05 NOTE — Progress Notes (Signed)
I connected with  Mohamed Portlock Simms on 05/05/22 by a audio enabled telemedicine application and verified that I am speaking with the correct person using two identifiers.  Patient Location: Home  Provider Location: Office/Clinic  I discussed the limitations of evaluation and management by telemedicine. The patient expressed understanding and agreed to proceed.   Subjective:   [name] is a 72 y.o. male who presents for Medicare Annual/Subsequent preventive examination.  Review of Systems     Cardiac Risk Factors include: advanced age (>36mn, >>69women);dyslipidemia;male gender;hypertension     Objective:    Today's Vitals   05/05/22 1042  Weight: 186 lb (84.4 kg)   Body mass index is 27.47 kg/m.     05/05/2022   10:51 AM 12/22/2021   11:53 AM 08/27/2021    3:03 PM 05/12/2021   10:07 AM 12/08/2020    4:11 PM 11/21/2019    7:54 PM 10/02/2019    1:58 PM  Advanced Directives  Does Patient Have a Medical Advance Directive? Yes Yes Yes Yes Yes Yes No  Type of AParamed[place]of [place] will  [place] will [place] will [place]Living will Living will;Healthcare Power of Attorney   Does patient want to make changes to medical advance directive?  No - Patient declined  No - Patient declined No - Guardian declined    Copy of [place]in Chart? No - copy requested   No - copy requested No - copy requested      Current Medications (verified) Outpatient Encounter Medications as of 05/05/2022  Medication Sig   acetaminophen (TYLENOL) 325 MG tablet Take 650 mg by mouth every 6 (six) hours as needed.   ARIPiprazole (ABILIFY) 2 MG tablet Take 2 mg by mouth daily. Taking [facility]  aspirin EC 81 MG tablet Take 1 tablet (81 mg total) by mouth daily. Swallow whole.   Carbidopa-Levodopa ER (RYTARY) 48.75-195 MG CPCR Take 2 capsules by mouth 3 (three) times  daily.   cholecalciferol (VITAMIN D) 1000 UNITS tablet Take 1,000 Units by mouth daily.   Cranberry 200 MG CAPS Take by mouth.   cycloSPORINE (RESTASIS) 0.05 % ophthalmic emulsion SMARTSIG:In Eye(s)   diclofenac Sodium (VOLTAREN) 1 % GEL Apply topically 4 (four) times daily.   escitalopram (LEXAPRO) 20 MG tablet TAKE 1 TABLET DAILY   fluticasone (FLONASE) 50 MCG/ACT nasal spray Place 2 sprays into both nostrils daily.   Melatonin 10 MG TABS Take 15 mg by mouth at bedtime.   memantine (NAMENDA) 10 MG tablet TAKE 1 TABLET TWICE A DAY   midodrine (PROAMATINE) 5 MG tablet Take 1 tablet (5 mg total) by mouth as needed. For orthostasis, drops in pressure <100 when standing   Nystatin POWD Use twice a day if needed.   omeprazole (PRILOSEC) 20 MG capsule TAKE 1 CAPSULE DAILY   rivastigmine (EXELON) 9.5 mg/24hr 9.5 mg daily.   [DISCONTINUED] cephALEXin (KEFLEX) 500 MG capsule Take 1 capsule (500 mg total) by mouth 3 (three) times daily. (Patient not taking: Reported on 02/28/2022)   No facility-administered encounter medications on file as of 05/05/2022.    Allergies (verified) Clonazepam, Morphine and related, Nuplazid [pimavanserin tartrate], Pimavanserin, Prednisone, and Tramadol   History: Past Medical History:  Diagnosis Date   Arthritis    Bradycardia    [place]   [place] cancer   Depression    ptsd   Dysrhythmia  chronic slow heart rate   GERD (gastroesophageal reflux disease)    Headache(784.0)    tension headaches non recent   History of chicken pox    History of kidney stones    passed   Hypertension    treated with HCTZ   Pacemaker    Parkinson's disease    dx'ed 15 years ago   PTSD (post-traumatic stress disorder)    Shortness of breath dyspnea    Sleep apnea    doesn't use C-pap   Varicose veins    Past Surgical History:  Procedure Laterality Date   CHOLECYSTECTOMY N/A 10/22/2014   Procedure: LAPAROSCOPIC CHOLECYSTECTOMY WITH INTRAOPERATIVE CHOLANGIOGRAM;   Surgeon: Dia Crawford III, MD;  Location: [facility] ORS;  Service: General;  Laterality: N/A;   cyst removed      from lip as a child   INTRAMEDULLARY (IM) NAIL INTERTROCHANTERIC N/A 02/18/2019   Procedure: INTRAMEDULLARY (IM) NAIL INTERTROCHANTRIC, RIGHT,;  Surgeon: Thornton Park, MD;  Location: [facility] ORS;  Service: Orthopedics;  Laterality: N/A;   LUMBAR LAMINECTOMY/DECOMPRESSION MICRODISCECTOMY Bilateral 12/14/2012   Procedure: Bilateral lumbar three-four, four-five decompressive laminotomy/foraminotomy;  Surgeon: Charlie Pitter, MD;  Location: [place] [facility];  Service: Neurosurgery;  Laterality: Bilateral;   PULSE GENERATOR IMPLANT Bilateral 12/13/2013   Procedure: Bilateral implantable pulse generator placement;  Surgeon: Erline Levine, MD;  Location: [place] [facility];  Service: Neurosurgery;  Laterality: Bilateral;  Bilateral implantable pulse generator placement   skin cancer removed     from ears,   12 lft arm  rt leg 15   SUBTHALAMIC STIMULATOR BATTERY REPLACEMENT Bilateral 07/14/2017   Procedure: BILATERAL IMPLANTED PULSE GENERATOR CHANGE FOR DEEP BRAIN STIMULATOR;  Surgeon: Erline Levine, MD;  Location: [place];  Service: Neurosurgery;  Laterality: Bilateral;   SUBTHALAMIC STIMULATOR INSERTION Bilateral 12/06/2013   Procedure: SUBTHALAMIC STIMULATOR INSERTION;  Surgeon: Erline Levine, MD;  Location: [place] [facility];  Service: Neurosurgery;  Laterality: Bilateral;  Bilateral deep brain stimulator placement   Family History  Problem Relation Age of Onset   Lung cancer Father    Colon cancer Neg Hx    Prostate cancer Neg Hx    Social History   Socioeconomic History   Marital status: Married    Spouse name: Not on file   Number of children: Not on file   Years of education: Not on file   Highest education level: Not on file  Occupational History   Occupation: disabled    Comment: 2001, PD    Comment: search and rescue helicopter  Tobacco Use   Smoking status: Never   Smokeless tobacco: Former   Scientific laboratory technician Use: Never used  Substance and Sexual Activity   Alcohol use: Yes    Alcohol/week: 0.0 standard drinks of alcohol    Comment: occasional (twice per month)   Drug use: No   Sexual activity: Not Currently  Other Topics Concern   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  Prev search and rescue work   3 kids from prev relationship.    Married 2014   Social Determinants of Health   Financial Resource Strain: Low Risk  (05/05/2022)   Overall Financial Resource Strain (CARDIA)    Difficulty of Paying Living Expenses: Not hard at all  Food Insecurity: No Food Insecurity (05/05/2022)   Hunger Vital Sign    Worried About Running Out of Food in the Last Year: Never true    Ran  Out of Food in the Last Year: Never true  Transportation Needs: No Transportation Needs (05/05/2022)   PRAPARE - Hydrologist (Medical): No    Lack of Transportation (Non-Medical): No  Physical Activity: Sufficiently Active (05/05/2022)   Exercise Vital Sign    Days of Exercise per Week: 4 days    Minutes of Exercise per Session: 50 min  Stress: No Stress Concern Present (05/05/2022)   [place]    Feeling of Stress : Not at all  Social Connections: Moderately Integrated (05/05/2022)   Social Connection and Isolation Panel [NHANES]    Frequency of Communication with Friends and Family: Twice a week    Frequency of Social Gatherings with Friends and Family: Twice a week    Attends Religious Services: More than 4 times per year    Active Member of [employer] or Organizations: No    Attends Music therapist: Never    Marital Status: Married    Tobacco Counseling Counseling given: Not Answered   Clinical Intake:  Pre-visit preparation completed: Yes  Pain : No/denies pain     BMI - recorded: 27.47 Nutritional Status: BMI 25 -29  Overweight Nutritional Risks: None Diabetes: No  How often do you need to have someone help you when you read instructions, pamphlets, or other written materials from your doctor or pharmacy?: 1 - Never  Diabetic?no  Interpreter Needed?: No  Information entered by :: Charlott Rakes, LPN   Activities of Daily Living    05/05/2022   10:52 AM  In your present state of health, do you have any difficulty performing the following activities:  Hearing? 0  Vision? 0  Difficulty concentrating or making decisions? 1  Comment at times  Walking or climbing stairs? 1  Comment balance and mobility  Dressing or bathing? 1  Comment assistance  Doing errands, shopping? 1  Preparing Food and eating ? Y  Comment assistance  Using the Toilet? Y  Comment assistance  In the past six months, have you accidently leaked urine? Y  Comment wears a brief  Do you have problems with loss of bowel control? Y  Comment wears a brief  Managing your Medications? Y  Managing your Finances? Y  Housekeeping or managing your Housekeeping? Y  Comment assisstance    Patient Care Team: Tonia Ghent, MD as PCP - General (Family Medicine) Rockey Situ Kathlene November, MD as PCP - Cardiology (Cardiology) Vickie Epley, MD as PCP - Electrophysiology (Cardiology) Estrella Myrtle, Nicolasa Ducking, LCSW as Social Worker (Licensed Clinical Social Worker) Laverle Hobby, MD as Consulting Physician (Pulmonary Disease) Minna Merritts, MD as Consulting Physician (Cardiology) Earnie Larsson, MD as Consulting Physician (Neurosurgery) Ree Edman, MD as Consulting Physician (Dermatology) Charlton Haws, [facility] as Pharmacist (Pharmacist)  Indicate any recent Medical Services you may have received from other than [facility] providers in the past year (date may be approximate).     Assessment:   This is a routine wellness examination for Istvan.  Hearing/Vision screen Hearing Screening - Comments:: Pt denies any hearing issues   Vision Screening - Comments:: Pt follows up with  [facility]   Dietary issues and exercise activities discussed: Current Exercise Habits: Structured exercise class, Type of exercise: Other - see comments (PT and Caregiver), Time (Minutes): 50, Frequency (Times/Week): 4, Weekly Exercise (Minutes/Week): 200   Goals Addressed             This Visit's Progress  Patient Stated       Get healthier        Depression Screen    05/05/2022   10:47 AM 10/02/2018    9:34 AM 10/03/2017    2:46 PM 09/28/2017    8:59 AM 07/25/2016    2:20 PM 07/21/2015    2:29 PM 07/21/2015    2:05 PM  PHQ 2/9 Scores  PHQ - 2 Score 2 0 0 0 '5 3 3  '$ PHQ- 9 Score 5 8 0 '14 15 10 10    '$ Fall Risk    05/05/2022   10:51 AM 11/17/2020   11:02 AM 10/02/2018    9:34 AM 09/28/2017    8:59 AM 07/03/2017    8:08 AM  Fall Risk   Falls in the past year? '1 1 1 '$ Yes Yes  Number falls in past yr: '1 1 1 2 '$ or more 2 or more  Injury with Fall? '1 1 1 '$ No   Comment hit head      Risk Factor Category     High Fall Risk High Fall Risk  Risk for fall due to : History of fall(s);Impaired balance/gait;Impaired mobility;Impaired vision History of fall(s);Impaired balance/gait History of fall(s);Impaired balance/gait;Impaired mobility Impaired balance/gait;History of fall(s);Impaired mobility   Follow up Falls prevention discussed Falls evaluation completed   Falls evaluation completed    FALL RISK PREVENTION PERTAINING TO THE HOME:  Any stairs in or around the home? Yes  If so, are there any without handrails? No  Home free of loose throw rugs in walkways, pet beds, electrical cords, etc? Yes  Adequate lighting in your home to reduce risk of falls? Yes   ASSISTIVE DEVICES UTILIZED TO PREVENT FALLS:  Life alert? No  Use of a cane, walker or w/c? Yes  Grab bars in the bathroom? Yes  Shower chair or bench in shower? Yes  Elevated toilet seat or a handicapped toilet? Yes   TIMED UP AND GO:  Was the test performed? No .    Cognitive Function:    10/02/2018    9:56 AM 09/28/2017    8:55 AM 07/03/2017    9:18 AM 12/02/2016    4:03 PM 07/25/2016    2:30 PM  MMSE - Mini Mental State Exam  Not completed:   Refused Unable to complete   Orientation to time '5 5   5  '$ Orientation to Place '5 5   5  '$ Registration '3 3   3  '$ Attention/ Calculation 0 0   0  Recall '3 3   3  '$ Language- name 2 objects 0 0   0  Language- repeat '1 1   1  '$ Language- follow 3 step command 0 3   3  Language- read & follow direction 0 0   0  Write a sentence 0 0   0  Copy design 0 0   0  Total score '17 20   20        '$ 05/05/2022   10:55 AM  6CIT Screen  What Year? 0 points  What month? 0 points  What time? 0 points  Count back from 20 0 points  Months in reverse 0 points  Repeat phrase 2 points  Total Score 2 points    Immunizations Immunization History  Administered Date(s) Administered   Fluad Quad(high Dose 65+) 01/14/2019, 02/07/2020   Influenza, High Dose Seasonal PF 03/24/2021   Influenza,inj,Quad PF,6+ Mos 01/17/2014, 01/05/2015, 03/23/2016, 02/23/2017, 02/01/2018   PFIZER Comirnaty(Gray Top)Covid-19 Tri-Sucrose Vaccine 09/11/2020  PFIZER(Purple Top)SARS-COV-2 Vaccination 07/12/2019, 08/02/2019, 02/07/2020   Pneumococcal Conjugate-13 01/05/2015, 03/15/2017   Pneumococcal Polysaccharide-23 05/03/2017   Tdap 09/02/2011   Zoster Recombinat (Shingrix) 12/12/2019, 05/14/2020    TDAP status: Due, Education has been provided regarding the importance of this vaccine. Advised may receive this vaccine at local pharmacy or Health Dept. Aware to provide a copy of the vaccination record if obtained from local pharmacy or Health Dept. Verbalized acceptance and understanding.  Flu Vaccine status: Up to date per pt at the [place]   Pneumococcal vaccine status: Up to date  Covid-19 vaccine status: Completed vaccines  Qualifies for Shingles Vaccine? Yes   Zostavax completed Yes   Shingrix Completed?: Yes  Screening Tests Health  Maintenance  Topic Date Due   DTaP/Tdap/Td (2 - Td or Tdap) 09/01/2021   INFLUENZA VACCINE  11/16/2021   COVID-19 Vaccine (5 - 2023-24 season) 12/17/2021   Fecal DNA (Cologuard)  12/25/2021   Medicare Annual Wellness (AWV)  05/06/2023   Pneumonia Vaccine 59+ Years old  Completed   Hepatitis C Screening  Completed   Zoster Vaccines- Shingrix  Completed   HPV VACCINES  Aged Out    Health Maintenance  Health Maintenance Due  Topic Date Due   DTaP/Tdap/Td (2 - Td or Tdap) 09/01/2021   INFLUENZA VACCINE  11/16/2021   COVID-19 Vaccine (5 - 2023-24 season) 12/17/2021   Fecal DNA (Cologuard)  12/25/2021    Colorectal cancer screening: Type of screening: Cologuard. Completed 12/26/18. Repeat every 3 years  Additional Screening:  Hepatitis C Screening:  Completed 07/21/15  Vision Screening: Recommended annual ophthalmology exams for early detection of glaucoma and other disorders of the eye. Is the patient up to date with their annual eye exam?  Yes  Who is the provider or what is the name of the office in which the patient attends annual eye exams? [facility]  If pt is not established with a provider, would they like to be referred to a provider to establish care? No .   Dental Screening: Recommended annual dental exams for proper oral hygiene  Community Resource Referral / Chronic Care Management: CRR required this visit?  No   CCM required this visit?  No      Plan:     I have personally reviewed and noted the following in the patient's chart:   Medical and social history Use of alcohol, tobacco or illicit drugs  Current medications and supplements including opioid prescriptions. Patient is not currently taking opioid prescriptions. Functional ability and status Nutritional status Physical activity Advanced directives List of other physicians Hospitalizations, surgeries, and ER visits in previous 12 months Vitals Screenings to include cognitive, depression, and  falls Referrals and appointments  In addition, I have reviewed and discussed with patient certain preventive protocols, quality metrics, and best practice recommendations. A written personalized care plan for preventive services as well as general preventive health recommendations were provided to patient.     Willette Brace, LPN   1/74/0814   Nurse Notes: none

## 2022-05-05 NOTE — Patient Instructions (Signed)
[name] , Thank you for taking time to come for your Medicare Wellness Visit. I appreciate your ongoing commitment to your health goals. Please review the following plan we discussed and let me know if I can assist you in the future.   These are the goals we discussed:  Goals      Patient Stated     Get healthier      physical     When weather permits, I will attempt to walk at least 10 min every other day.        This is a list of the screening recommended for you and due dates:  Health Maintenance  Topic Date Due   DTaP/Tdap/Td vaccine (2 - Td or Tdap) 09/01/2021   Flu Shot  11/16/2021   COVID-19 Vaccine (5 - 2023-24 season) 12/17/2021   Cologuard (Stool DNA test)  12/25/2021   Medicare Annual Wellness Visit  05/06/2023   Pneumonia Vaccine  Completed   Hepatitis C Screening: USPSTF Recommendation to screen - Ages 18-79 yo.  Completed   Zoster (Shingles) Vaccine  Completed   HPV Vaccine  Aged Out    Advanced directives: Please bring a copy of your health care power of attorney and living will to the office at your convenience.  Conditions/risks identified: get healthier   Next appointment: Follow up in one year for your annual wellness visit.   Preventive Care 23 Years and Older, Male  Preventive care refers to lifestyle choices and visits with your health care provider that can promote health and wellness. What does preventive care include? A yearly physical exam. This is also called an annual well check. Dental exams once or twice a year. Routine eye exams. Ask your health care provider how often you should have your eyes checked. Personal lifestyle choices, including: Daily care of your teeth and gums. Regular physical activity. Eating a healthy diet. Avoiding tobacco and drug use. Limiting alcohol use. Practicing safe sex. Taking low doses of aspirin every day. Taking vitamin and mineral supplements as recommended by your health care provider. What happens  during an annual well check? The services and screenings done by your health care provider during your annual well check will depend on your age, overall health, lifestyle risk factors, and family history of disease. Counseling  Your health care provider may ask you questions about your: Alcohol use. Tobacco use. Drug use. Emotional well-being. Home and relationship well-being. Sexual activity. Eating habits. History of falls. Memory and ability to understand (cognition). Work and work Statistician. Screening  You may have the following tests or measurements: Height, weight, and BMI. Blood pressure. Lipid and cholesterol levels. These may be checked every 5 years, or more frequently if you are over 88 years old. Skin check. Lung cancer screening. You may have this screening every year starting at age 70 if you have a 30-pack-year history of smoking and currently smoke or have quit within the past 15 years. Fecal occult blood test (FOBT) of the stool. You may have this test every year starting at age 36. Flexible sigmoidoscopy or colonoscopy. You may have a sigmoidoscopy every 5 years or a colonoscopy every 10 years starting at age 5. Prostate cancer screening. Recommendations will vary depending on your family history and other risks. Hepatitis C blood test. Hepatitis B blood test. Sexually transmitted disease (STD) testing. Diabetes screening. This is done by checking your blood sugar (glucose) after you have not eaten for a while (fasting). You may have this done every  1-3 years. Abdominal aortic aneurysm (AAA) screening. You may need this if you are a current or former smoker. Osteoporosis. You may be screened starting at age 34 if you are at high risk. Talk with your health care provider about your test results, treatment options, and if necessary, the need for more tests. Vaccines  Your health care provider may recommend certain vaccines, such as: Influenza vaccine. This is  recommended every year. Tetanus, diphtheria, and acellular pertussis (Tdap, Td) vaccine. You may need a Td booster every 10 years. Zoster vaccine. You may need this after age 47. Pneumococcal 13-valent conjugate (PCV13) vaccine. One dose is recommended after age 8. Pneumococcal polysaccharide (PPSV23) vaccine. One dose is recommended after age 58. Talk to your health care provider about which screenings and vaccines you need and how often you need them. This information is not intended to replace advice given to you by your health care provider. Make sure you discuss any questions you have with your health care provider. Document Released: 05/01/2015 Document Revised: 12/23/2015 Document Reviewed: 02/03/2015 Elsevier Interactive Patient Education  2017 [place] Prevention in the Home Falls can cause injuries. They can happen to people of all ages. There are many things you can do to make your home safe and to help prevent falls. What can I do on the outside of my home? Regularly fix the edges of walkways and driveways and fix any cracks. Remove anything that might make you trip as you walk through a door, such as a raised step or threshold. Trim any bushes or trees on the path to your home. Use bright outdoor lighting. Clear any walking paths of anything that might make someone trip, such as rocks or tools. Regularly check to see if handrails are loose or broken. Make sure that both sides of any steps have handrails. Any raised decks and porches should have guardrails on the edges. Have any leaves, snow, or ice cleared regularly. Use sand or salt on walking paths during winter. Clean up any spills in your garage right away. This includes oil or grease spills. What can I do in the bathroom? Use night lights. Install grab bars by the toilet and in the tub and shower. Do not use towel bars as grab bars. Use non-skid mats or decals in the tub or shower. If you need to sit down in  the shower, use a plastic, non-slip stool. Keep the floor dry. Clean up any water that spills on the floor as soon as it happens. Remove soap buildup in the tub or shower regularly. Attach bath mats securely with double-sided non-slip rug tape. Do not have throw rugs and other things on the floor that can make you trip. What can I do in the bedroom? Use night lights. Make sure that you have a light by your bed that is easy to reach. Do not use any sheets or blankets that are too big for your bed. They should not hang down onto the floor. Have a firm chair that has side arms. You can use this for support while you get dressed. Do not have throw rugs and other things on the floor that can make you trip. What can I do in the kitchen? Clean up any spills right away. Avoid walking on wet floors. Keep items that you use a lot in easy-to-reach places. If you need to reach something above you, use a strong step stool that has a grab bar. Keep electrical cords out of the way.  Do not use floor polish or wax that makes floors slippery. If you must use wax, use non-skid floor wax. Do not have throw rugs and other things on the floor that can make you trip. What can I do with my stairs? Do not leave any items on the stairs. Make sure that there are handrails on both sides of the stairs and use them. Fix handrails that are broken or loose. Make sure that handrails are as long as the stairways. Check any carpeting to make sure that it is firmly attached to the stairs. Fix any carpet that is loose or worn. Avoid having throw rugs at the top or bottom of the stairs. If you do have throw rugs, attach them to the floor with carpet tape. Make sure that you have a light switch at the top of the stairs and the bottom of the stairs. If you do not have them, ask someone to add them for you. What else can I do to help prevent falls? Wear shoes that: Do not have high heels. Have rubber bottoms. Are comfortable  and fit you well. Are closed at the toe. Do not wear sandals. If you use a stepladder: Make sure that it is fully opened. Do not climb a closed stepladder. Make sure that both sides of the stepladder are locked into place. Ask someone to hold it for you, if possible. Clearly mark and make sure that you can see: Any grab bars or handrails. First and last steps. Where the edge of each step is. Use tools that help you move around (mobility aids) if they are needed. These include: Canes. Walkers. Scooters. Crutches. Turn on the lights when you go into a dark area. Replace any light bulbs as soon as they burn out. Set up your furniture so you have a clear path. Avoid moving your furniture around. If any of your floors are uneven, fix them. If there are any pets around you, be aware of where they are. Review your medicines with your doctor. Some medicines can make you feel dizzy. This can increase your chance of falling. Ask your doctor what other things that you can do to help prevent falls. This information is not intended to replace advice given to you by your health care provider. Make sure you discuss any questions you have with your health care provider. Document Released: 01/29/2009 Document Revised: 09/10/2015 Document Reviewed: 05/09/2014 Elsevier Interactive Patient Education  2017 [employer].

## 2022-05-09 ENCOUNTER — Ambulatory Visit: Payer: Medicare PPO | Admitting: Physical Therapy

## 2022-05-09 ENCOUNTER — Encounter: Payer: Self-pay | Admitting: Physical Therapy

## 2022-05-09 ENCOUNTER — Ambulatory Visit: Payer: Medicare PPO

## 2022-05-09 DIAGNOSIS — R2681 Unsteadiness on feet: Secondary | ICD-10-CM

## 2022-05-09 DIAGNOSIS — M6281 Muscle weakness (generalized): Secondary | ICD-10-CM | POA: Diagnosis not present

## 2022-05-09 DIAGNOSIS — G20A1 Parkinson's disease without dyskinesia, without mention of fluctuations: Secondary | ICD-10-CM | POA: Diagnosis not present

## 2022-05-09 DIAGNOSIS — R278 Other lack of coordination: Secondary | ICD-10-CM | POA: Diagnosis not present

## 2022-05-09 DIAGNOSIS — R262 Difficulty in walking, not elsewhere classified: Secondary | ICD-10-CM | POA: Diagnosis not present

## 2022-05-09 DIAGNOSIS — R269 Unspecified abnormalities of gait and mobility: Secondary | ICD-10-CM | POA: Diagnosis not present

## 2022-05-09 DIAGNOSIS — R296 Repeated falls: Secondary | ICD-10-CM

## 2022-05-09 NOTE — Therapy (Signed)
OUTPATIENT OCCUPATIONAL THERAPY NEURO TREATMENT NOTE  Patient Name: [name] MRN: [id number] DOB:[birth year], 72 y.o., male Today's Date: 05/09/2022  PCP: Dr. Elsie Stain REFERRING PROVIDER: Dr. Elsie Stain   OT End of Session - 05/09/22 1107     Visit Number 15    Number of Visits 24    Date for OT Re-Evaluation 05/23/22    Authorization Time Period Reporting period beginning 04/13/22    OT Start Time 14    OT Stop Time 1145    OT Time Calculation (min) 45 min    Equipment Utilized During Treatment rollator    Activity Tolerance Patient tolerated treatment well    Behavior During Therapy [facility] for tasks assessed/performed                Past Medical History:  Diagnosis Date   Arthritis    Bradycardia    Cancer ([place]) 2013   skin cancer   Depression    ptsd   Dysrhythmia    chronic slow heart rate   GERD (gastroesophageal reflux disease)    Headache(784.0)    tension headaches non recent   History of chicken pox    History of kidney stones    passed   Hypertension    treated with HCTZ   Pacemaker    Parkinson's disease    dx'ed 15 years ago   PTSD (post-traumatic stress disorder)    Shortness of breath dyspnea    Sleep apnea    doesn't use C-pap   Varicose veins    Past Surgical History:  Procedure Laterality Date   CHOLECYSTECTOMY N/A 10/22/2014   Procedure: LAPAROSCOPIC CHOLECYSTECTOMY WITH INTRAOPERATIVE CHOLANGIOGRAM;  Surgeon: Dia Crawford III, MD;  Location: [facility] ORS;  Service: General;  Laterality: N/A;   cyst removed      from lip as a child   INTRAMEDULLARY (IM) NAIL INTERTROCHANTERIC N/A 02/18/2019   Procedure: INTRAMEDULLARY (IM) NAIL INTERTROCHANTRIC, RIGHT,;  Surgeon: Thornton Park, MD;  Location: [facility] ORS;  Service: Orthopedics;  Laterality: N/A;   LUMBAR LAMINECTOMY/DECOMPRESSION MICRODISCECTOMY Bilateral 12/14/2012   Procedure: Bilateral lumbar three-four, four-five decompressive laminotomy/foraminotomy;  Surgeon: Charlie Pitter, MD;   Location: [place] NEURO OR[facility];  Service: Neurosurgery;  Laterality: Bilateral;   PULSE GENERATOR IMPLANT Bilateral 12/13/2013   Procedure: Bilateral implantable pulse generator placement;  Surgeon: Erline Levine, MD;  Location: [place] NEURO ORS;  Service: Neurosurgery;  Laterality: Bilateral;  Bilateral implantable pulse generator placement   skin cancer removed     from ears,   12 lft arm  rt leg 15   SUBTHALAMIC STIMULATOR BATTERY REPLACEMENT Bilateral 07/14/2017   Procedure: BILATERAL IMPLANTED PULSE GENERATOR CHANGE FOR DEEP BRAIN STIMULATOR;  Surgeon: Erline Levine, MD;  Location: [place];  Service: Neurosurgery;  Laterality: Bilateral;   SUBTHALAMIC STIMULATOR INSERTION Bilateral 12/06/2013   Procedure: SUBTHALAMIC STIMULATOR INSERTION;  Surgeon: Erline Levine, MD;  Location: [place] NEURO ORS;  Service: Neurosurgery;  Laterality: Bilateral;  Bilateral deep brain stimulator placement   Patient Active Problem List   Diagnosis Date Noted   Hematuria 10/10/2021   Sinus drainage 12/09/2020   Medicare annual wellness visit, subsequent 11/18/2020   Pacemaker    Dementia associated with Parkinson's disease ([place]) 06/17/2020   Vertigo 02/19/2020   Urine abnormality 02/19/2020   Dysphagia 02/23/2019   Fall at home 10/18/2017   REM behavioral disorder 11/02/2016   Radicular pain in right arm 10/09/2016   GERD (gastroesophageal reflux disease) 07/31/2016   Colon cancer screening 07/23/2015   Gout 02/18/2015  Depression 01/06/2015   Skin lesion 10/02/2014   Dupuytren's contracture 08/20/2014   Cough 07/21/2014   Lumbar stenosis with neurogenic claudication 06/13/2014   S/P deep brain stimulator placement 05/08/2014   Advance care planning 01/19/2014   Parkinson's disease 12/13/2013   PTSD (post-traumatic stress disorder) 06/13/2013   Erectile dysfunction 06/13/2013   HLD (hyperlipidemia) 06/13/2013   Essential hypertension 06/03/2013   Bradycardia by electrocardiogram 06/03/2013   Obstructive sleep apnea  03/12/2013    ONSET DATE: 2015  REFERRING DIAG: Parkinson's Disease  THERAPY DIAG:  Muscle weakness (generalized)  Other lack of coordination  Parkinson's disease without dyskinesia or fluctuating manifestations  Rationale for Evaluation and Treatment: Rehabilitation  SUBJECTIVE: Caregiver reports pt with increased R lateral lean in standing today.  SUBJECTIVE STATEMENT: Pt accompanied by:  caregiver [name])  PERTINENT HISTORY: Parkinson's Disease, dementia, DBS, multiple falls  PRECAUTIONS: Fall  WEIGHT BEARING RESTRICTIONS: No  PAIN:  Are you having pain? Yes: NPRS scale: 4/10 Pain location: hips and low back, R knee Pain description: sharp, tight Aggravating factors: if pt crosses feet too long/awkward positioning Relieving factors: voltaren gel   FALLS: Has patient fallen in last 6 months? Yes. Number of falls : spouse estimates 20  LIVING ENVIRONMENT: Lives with: lives with their spouse Lives in: house, 1 level Stairs: No Has following equipment at home: [place] entry, handicapped accessible bathroom  PLOF: Needs assistance with ADLs  PATIENT GOALS: increase bilat hand coordination for ADLs  OBJECTIVE:   HAND DOMINANCE: Right  ADLs: Overall ADLs: caregiver assist for all ADLs; pt currently as 16 hrs of caregiver assist per week through the [place].  Pt recently was assigned 100% disability by the [place], and spouse reports caregiver hours will increase to 46 per week, but uncertain when Transfers/ambulation related to ADLs: supv; pt uses upright 4 wheeled walker Eating: caregivers help to cut food Grooming: mod A to shave with an electric razor UB Dressing: min A, difficulty with buttons LB Dressing: mod A, difficulty with zippers and buttons Toileting: occasional accidents with bowel and bladder, but less than weekly and only during the night; distant supv for toileting during the day Bathing: min A for bathing, mod A for drying off Tub Shower transfers: min  guard for shower transfers with grab bar and shower chair Equipment:  fully handicapped shower and toilet  IADLs: Shopping: dep on caregiver Light housekeeping: dep Meal Prep: dep Community mobility: short distance community amb with supv and 4 wheeled walker Medication management: dep Financial management: dep Handwriting:  printed first/last name 50% legible, cursive first/last name illegible  MOBILITY STATUS: Hx of falls  POSTURE COMMENTS:  rounded shoulders, forward head, increased thoracic kyphosis, and flexed trunk   FUNCTIONAL OUTCOME MEASURES: FOTO: 40.2 (completed with PT); predicted 48  UPPER EXTREMITY ROM:   Pt demos BUE shoulder ROM WFL for self care  UPPER EXTREMITY MMT:     MMT Right eval Left eval  Shoulder flexion 5 5  Shoulder abduction 5 5  Shoulder adduction    Shoulder extension    Shoulder internal rotation    Shoulder external rotation    Middle trapezius    Lower trapezius    Elbow flexion 5 5  Elbow extension 5 5  Wrist flexion 5 5  Wrist extension 5 5  Wrist ulnar deviation    Wrist radial deviation    Wrist pronation    Wrist supination    (Blank rows = not tested)  HAND FUNCTION: Grip strength: Right: 61 lbs; Left:  61 lbs, Lateral pinch: Right: 20 lbs, Left: 19 lbs, and 3 point pinch: Right: 11 lbs, Left: 15 lbs 04/13/22: Grip strength: Right: 66 lbs; Left: 65 lbs, Lateral pinch: Right: 18 lbs, Left: 18 lbs, and 3 point pinch: Right: 14 lbs, Left: 15 lbs  COORDINATION: Finger Nose Finger test: increased time, L side touched lower than tip, corrected with min vc 9 Hole Peg test: Right: 1 min 18 sec; Left: 41 sec 04/13/22: 9 Hole Peg test: Right: 1 min 4 sec; Left: 37 sec   SENSATION: Proprioception: Impaired   EDEMA: none  COGNITION: Overall cognitive status: History of cognitive impairments - at baseline; Parkinson's with dementia  VISION: Wears prism glasses; hx of visual hallucinations  PRAXIS: Impaired: Motor planning;  bradykinesia, R>L  TODAY'S TREATMENT:                                                                                                                               Therapeutic Exercise:  Pt completed 7.0 lb digi-flex for finger strengthening, cues for digit isolation. Pt performed gross gripping with grip strengthener with 1 blue and 1 green band for resistance. Competed 3 set x 10 reps each. Blue theraputty issued.    Therapeutic Activity: Pt worked on [facility] skills grasping small 1/2" pegs from bucket, performing translatory movements moving each peg through the hand from the palm to the tip of the 2nd digit and thumb in preparation for placing them in the pegboard following a design pattern. MIN cues to complete design accurately (Judy hundred pegboard #8). ~50% of pegs placed with white dot on top, requested pt to flip and place white dots on bottom. Repeated cues t/o to recall directions and check for errors.    PATIENT EDUCATION: Education details: HEP (list of activities) for caregiver carryover Person educated: caregiver, [name] Education method: Explanation, written handout Education comprehension: verbalized understanding  HOME EXERCISE PROGRAM: To be established in the next 1-2 sessions   GOALS: Goals reviewed with patient? Yes  SHORT TERM GOALS: Target date: 04/11/22  1. Pt will complete HEP for increasing [place] and strength in bilat hands with direct supv at least 4 of 7 days per week. Baseline: not yet initiated; 04/03/22: caregiver reports pt has putty but rarely uses Goal status: ongoing  2. Pt will clean his glasses with min A and mod vc, including opening cleaning packet.   Baseline: max A; 04/13/22: able to clean with set up/supv with min vc.   Goal status: achieved   LONG TERM GOALS: Target date: 05/23/22  Pt will increase FOTO score by 5 or more points to indicate increase in self/caregiver perceived functional performance with daily tasks.  Baseline: 40.2  (predicted 48);  Goal status: INITIAL  2.  Pt will increase R/L lateral pinch strength by 4 or more lbs to enable pt to independently turn on electric toothbrush and twist open toothpaste. Baseline: R 20, L 19; caregiver assist to turn on toothbrush  and open toothpaste; 04/13/22: B 18 lbs, pt still struggles with above activities Goal status: INITIAL  3.  Pt will increase [place] in bilat hands to manage buttons and zippers on clothing 75% of the time.  Baseline: caregivers assist with clothing fasteners at least 50% of the time; 04/13/22: caregiver reports pt requires assist to set up zipper on coat, and pt can manage buttons on jeans and zipper, occasionally having to lie flat on back to manage fasteners.  Pt can manage buttons with extra time on shirt (9 hole: R 1 min 4 sec, L 37 sec) Goal status: ongoing  4.  Pt will scoop food from plate/bowl with min vc using adapted utensils and adapted plates/bowls as needed.  Baseline: caregiver constantly turns plate to compensate for pt pushing food to 1 side; 04/13/22: pt now uses divided plate which allows pt to scoop his food without having spouse turn his plate or bowl.  Pt continues to use built up eating utensils. Goal status: achieved  5.  Pt will shave with electric razor with min A for thoroughness. Baseline: pt struggles to grade sufficient pressure to thoroughly shave face, requiring mod A; 04/13/22: Spouse is waiting for a nicer razor from the [place], so last few times barber will shave him when he's getting a hair cut.   Goal status: ongoing  ASSESSMENT:  CLINICAL IMPRESSION: Caregiver reports increased R lateral lean in standing today. Completed 100 peg board design pattern with good attention to task. Then asked to flip all pegs to hide white dot, pt required cues to check errors and attend to task. Updated HEP, blue theraputty provided.  Will continue to provide dual tasking activities to increase standing tolerance, balance, and activity  tolerance for improved indep and decreased fall risk with standing ADLs.  Pt. continues to benefit from OT services to maximize hand strength and coordination for daily tasks, and provide strategies to maximize indep with ADLs and leisure activities to increase QOL.  PERFORMANCE DEFICITS: in functional skills including ADLs, IADLs, coordination, dexterity, proprioception, strength, pain, Fine motor control, Gross motor control, mobility, balance, body mechanics, endurance, decreased knowledge of use of DME, and UE functional use, cognitive skills including attention, learn, memory, orientation, problem solving, safety awareness, thought, and understand. IMPAIRMENTS: are limiting patient from ADLs, IADLs, rest and sleep, leisure, and social participation.   CO-MORBIDITIES: has co-morbidities such as dementia  that affects occupational performance. Patient will benefit from skilled OT to address above impairments and improve overall function.  MODIFICATION OR ASSISTANCE TO COMPLETE EVALUATION: Min-Moderate modification of tasks or assist with assess necessary to complete an evaluation.  OT OCCUPATIONAL PROFILE AND HISTORY: Problem focused assessment: Including review of records relating to presenting problem.  CLINICAL DECISION MAKING: Moderate - several treatment options, min-mod task modification necessary  REHAB POTENTIAL: Good  EVALUATION COMPLEXITY: Moderate    PLAN:  OT FREQUENCY: 2x/week  OT DURATION: 12 weeks  PLANNED INTERVENTIONS: self care/ADL training, therapeutic exercise, therapeutic activity, neuromuscular re-education, manual therapy, balance training, functional mobility training, moist heat, cryotherapy, patient/family education, cognitive remediation/compensation, energy conservation, coping strategies training, and DME and/or AE instructions  RECOMMENDED OTHER SERVICES: N/A  CONSULTED AND AGREED WITH PLAN OF CARE: Patient and family member/caregiver  PLAN FOR NEXT  SESSION: see eval  Dessie Coma, M.S. OTR/L  05/09/22, 11:08 AM  ascom [phone]   Leonides Cave, OT 05/09/2022, 11:08 AM

## 2022-05-09 NOTE — Therapy (Signed)
OUTPATIENT PHYSICAL THERAPY NEURO Treatment     Patient Name: [name] MRN: [id number] DOB:[birth year], 72 y.o., male Today's Date: 05/09/2022   PCP: Dr. Elsie Mcgee REFERRING PROVIDER: Dr. Sharolyn Mcgee   PT End of Session - 05/09/22 1051     Visit Number 14    Number of Visits 24    Date for PT Re-Evaluation 05/23/22    Authorization Type [employer] Choice PPO    Authorization Time Period Cert through 07/23/80    Progress Note Due on Visit 20    PT Start Time 1145    PT Stop Time 1227    PT Time Calculation (min) 42 min    Equipment Utilized During Treatment Gait belt    Activity Tolerance Patient tolerated treatment well    Behavior During Therapy WFL for tasks assessed/performed                       Past Medical History:  Diagnosis Date   Arthritis    Bradycardia    Cancer ([place]) 2013   skin cancer   Depression    ptsd   Dysrhythmia    chronic slow heart rate   GERD (gastroesophageal reflux disease)    Headache(784.0)    tension headaches non recent   History of chicken pox    History of kidney stones    passed   Hypertension    treated with HCTZ   Pacemaker    Parkinson's disease    dx'ed 15 years ago   PTSD (post-traumatic stress disorder)    Shortness of breath dyspnea    Sleep apnea    doesn't use C-pap   Varicose veins    Past Surgical History:  Procedure Laterality Date   CHOLECYSTECTOMY N/A 10/22/2014   Procedure: LAPAROSCOPIC CHOLECYSTECTOMY WITH INTRAOPERATIVE CHOLANGIOGRAM;  Surgeon: Dia Crawford III, MD;  Location: [facility];  Service: General;  Laterality: N/A;   cyst removed      from lip as a child   INTRAMEDULLARY (IM) NAIL INTERTROCHANTERIC N/A 02/18/2019   Procedure: INTRAMEDULLARY (IM) NAIL INTERTROCHANTRIC, RIGHT,;  Surgeon: Thornton Park, MD;  Location: [facility];  Service: Orthopedics;  Laterality: N/A;   LUMBAR LAMINECTOMY/DECOMPRESSION MICRODISCECTOMY Bilateral 12/14/2012   Procedure: Bilateral lumbar  three-four, four-five decompressive laminotomy/foraminotomy;  Surgeon: Charlie Pitter, MD;  Location: [place] [facility];  Service: Neurosurgery;  Laterality: Bilateral;   PULSE GENERATOR IMPLANT Bilateral 12/13/2013   Procedure: Bilateral implantable pulse generator placement;  Surgeon: Erline Levine, MD;  Location: [place] [facility];  Service: Neurosurgery;  Laterality: Bilateral;  Bilateral implantable pulse generator placement   skin cancer removed     from ears,   12 lft arm  rt leg 15   SUBTHALAMIC STIMULATOR BATTERY REPLACEMENT Bilateral 07/14/2017   Procedure: BILATERAL IMPLANTED PULSE GENERATOR CHANGE FOR DEEP BRAIN STIMULATOR;  Surgeon: Erline Levine, MD;  Location: [place];  Service: Neurosurgery;  Laterality: Bilateral;   SUBTHALAMIC STIMULATOR INSERTION Bilateral 12/06/2013   Procedure: SUBTHALAMIC STIMULATOR INSERTION;  Surgeon: Erline Levine, MD;  Location: [place] [facility];  Service: Neurosurgery;  Laterality: Bilateral;  Bilateral deep brain stimulator placement   Patient Active Problem List   Diagnosis Date Noted   Hematuria 10/10/2021   Sinus drainage 12/09/2020   Medicare annual wellness visit, subsequent 11/18/2020   Pacemaker    Dementia associated with Parkinson's disease ([place]) 06/17/2020   Vertigo 02/19/2020   Urine abnormality 02/19/2020   Dysphagia 02/23/2019   Fall at home 10/18/2017   REM behavioral  disorder 11/02/2016   Radicular pain in right arm 10/09/2016   GERD (gastroesophageal reflux disease) 07/31/2016   Colon cancer screening 07/23/2015   Gout 02/18/2015   Depression 01/06/2015   Skin lesion 10/02/2014   Dupuytren's contracture 08/20/2014   Cough 07/21/2014   Lumbar stenosis with neurogenic claudication 06/13/2014   S/P deep brain stimulator placement 05/08/2014   Advance care planning 01/19/2014   Parkinson's disease 12/13/2013   PTSD (post-traumatic stress disorder) 06/13/2013   Erectile dysfunction 06/13/2013   HLD (hyperlipidemia) 06/13/2013   Essential  hypertension 06/03/2013   Bradycardia by electrocardiogram 06/03/2013   Obstructive sleep apnea 03/12/2013    ONSET DATE: 15 years ago per chart  REFERRING DIAG: Parkinson's disease  THERAPY DIAG:  Unsteadiness on feet  Repeated falls  Abnormality of gait and mobility  Difficulty in walking, not elsewhere classified  Muscle weakness (generalized)  Rationale for Evaluation and Treatment Rehabilitation  SUBJECTIVE:                                                                                                                                                                                              SUBJECTIVE STATEMENT: Pt reports no significant changes or falls since last session.  Pt accompanied by: significant other and [name] (caretaker)  PERTINENT HISTORY:   Date of most recent Admission: 09/25/2021 Date of Discharge: 09/28/2021  Discharge Diagnoses:  Altered mental status Parkinson disease (CMS/HCC) Hallucinations  Per [facility] discharge summary notes PHI : Hospital Course:   72 y.o. male with a history of Parkinson's Disease w/ DBS in place, PD-related dementia, OSA, depression, and seizure disorder who presented for EEG given concern for seizures.  Altered mental status Concern seizure activity Periods of unresponsiveness on the setting of new medication--Nuplazid (pimavanserin) recently started. No events of note on continuous EEG. This was held and he returned to his baseline mental status. Other workup including CT head, CTA h/n without obvious cause for periodic unresponsiveness. Nuplazid was discontinued.   Aspiration, weakness Of note, he had a coughing spell while eating though sounds like was precipitated by sneezing. Did well on RA, seen by SLP without any further needs or concerns.  Parkinson's Disease Parkinson's-related dementia - He follows in the [facility] Disorders center at [facility]. Clinic notes suggest he seems to have worsening of his overall  disease trajectory. - Continued home Memantine and Sinemet      PMH: HTN, bradycardia s/p PPM, PD, DBS, COVID-19 infection, chronic low back pain s/p L4/5 surgery.  PAIN:  Are you having pain? Yes: NPRS scale: 7/10 Pain location: Low back Pain description: ache Aggravating factors: Prolonged standing/sitting, bending  over Relieving factors: rest, changing positions  PRECAUTIONS: Fall  WEIGHT BEARING RESTRICTIONS No  FALLS: Has patient fallen in last 6 months? Yes. Number of falls >10  LIVING ENVIRONMENT: Lives with: lives with their spouse Lives in: House/apartment Stairs:  Ramp Has following equipment at home: Single point cane, Environmental consultant - 4 wheeled, Wheelchair (manual), shower chair, bed side commode, Grab bars, and Ramped entry  PLOF: Needs assistance with ADLs, Needs assistance with homemaking, Needs assistance with gait, and Needs assistance with transfers  PATIENT GOALS To walk better and not fall. Improve fall frequency to prevent significant injury   OBJECTIVE: (objective measures completed at initial evaluation unless otherwise dated)   DIAGNOSTIC FINDINGS:   COGNITION: Overall cognitive status: History of cognitive impairments - at baseline   SENSATION: Light touch: WFL  COORDINATION: Decreased and slow to initiate  EDEMA:  None observed     POSTURE: rounded shoulders, forward head, increased thoracic kyphosis, and flexed trunk      TRANSFERS: Assistive device utilized:  Upright 4WW   Sit to stand:  Min/mod assist- with increased VC for safety Stand to sit:  Min assist with Verbal cues to lock brakes and reach back Chair to chair: Min A     GAIT: Gait pattern: decreased arm swing- Right, decreased arm swing- Left, decreased step length- Right, decreased step length- Left, decreased stance time- Right, decreased stance time- Left, decreased stride length, and shuffling Distance walked: 150 ft. Assistive device utilized:  upright 4WW Level of  assistance: Min A Comments: Shuffling gait with slow gait speed  FUNCTIONAL TESTs:  5 times sit to stand: 19.5 sec with UE support with BUE support Timed up and go (TUG): 37.26 sec with upright 4WW 6 minute walk test: 720 feet using upright 4WW 10 meter walk test: 0.67 m/s  PATIENT SURVEYS:  FOTO 50  TODAY'S TREATMENT:  05/09/22  TE:   Hip abduction with 5# AW 2 x 10 reps  Hip extension with 5# AW 2 x 10 reps   Ambulation endurance circuit with 5# AW donned and with 6 x STS between ambulatory bouts  - 300 feet ambulation bouts with cues for picking up feet to prevent foot drag. Good response to challenge to pick feet up.   NMR:  1 LE on airex and 1 on step to work on SLS balance progression  3 x 45 sec ea LE   Seated on dynadisc 2 x 10 LAQ for core stability   Unless otherwise stated, CGA was provided and gait belt donned in order to ensure pt safety   PATIENT EDUCATION: Education details: PT plan of care; purpose of functional outcomes Person educated: Patient and Spouse Education method: Explanation and Verbal cues Education comprehension: verbalized understanding   HOME EXERCISE PROGRAM: Access Code: [email or web address] URL: [email or web address] Date: 04/20/2022 Prepared by: Rivka Barbara  Exercises - Sit to stand, walk with assistive device to another chair, sit and repeat.   - 1 x daily - 7 x weekly - 2 sets - 10 reps - Standing March with Counter Support  - 1 x daily - 7 x weekly - 3 sets - 10 reps - Standing in "bowling stance" with counter support   - 1 x daily - 7 x weekly - 3 sets - 10 reps - Heel Raises with Counter Support  - 1 x daily - 7 x weekly - 3 sets - 10 reps - Seated shoulder row while sitting on stability disc   - 1 x daily - 7  x weekly - 3 sets - 10 reps    GOALS: Goals reviewed with patient? Yes  SHORT TERM GOALS: Target date: 01/11/2022  Pt will be independent with HEP in order to improve strength and balance in order to  decrease fall risk and improve function at home and work.  Baseline: Patient reports not performing much in the way of any HEP except walking. 12/27: Having phases of participation and phases of refusal to participate.  Goal status: IN PROGRESS  LONG TERM GOALS: Target date: 02/22/2022  Pt will decrease 5TSTS by at least 5 seconds in order to demonstrate clinically significant improvement in LE strength. Baseline: 02/28/2022: 19.5 sec with UE support  04/13/22:11.4 sec  Goal status: IN PROGRESS- continue to monitor for consistent progression   2.  Pt will improve FOTO to target score of 48 to display perceived improvements in ability to complete ADL's.  Baseline: 02/28/22=40 12/27: 45 Goal status: IN PROGRESS  3.  Pt will decrease TUG to < or = to 25 seconds/decrease in order to demonstrate decreased fall risk. Baseline: 02/28/22= 37.26 sec using upright 4WW 12/27:20.21 sec  Goal status: IN PROGRESS Keep assessing to ensure progress is maintained   4.  Pt will increase 10MWT by at least 0.13 m/s in order to demonstrate clinically significant improvement in community ambulation.   Baseline: 02/28/22= 0.67 m/s using upright 4WW 12/27: .89 m/s  Goal status: MET  5.  Patient will increase six minute walk test distance to >1000 for progression to community ambulator and improve gait ability  Baseline: 02/28/22: 720 feet with up walker 04/13/22: 815 feet with up walker  Goal status: IN PROGRESS  6.  Patient will experience 1 fall or less per 2 week period for at least 3 weeks in order to improve his frequency of falls and prevent injury from falls  Baseline:04/13/22: Pt has had 1 fall in the last 2 weeks to his knowledge Goal status: INITIAL  ASSESSMENT:  CLINICAL IMPRESSION:  Patient presents with good motivation for completion of physical therapy activities.  Patient continues with lower extremity strengthening as well as targeted hip strengthening and endurance training this session.Pt  will continue to benefit from skilled physical therapy intervention to address impairments, improve QOL, and attain therapy goals.     OBJECTIVE IMPAIRMENTS Abnormal gait, decreased activity tolerance, decreased balance, decreased cognition, decreased coordination, decreased endurance, decreased mobility, difficulty walking, decreased ROM, decreased strength, decreased safety awareness, hypomobility, impaired perceived functional ability, and pain.   ACTIVITY LIMITATIONS carrying, lifting, bending, standing, squatting, stairs, transfers, bed mobility, bathing, toileting, dressing, self feeding, hygiene/grooming, and caring for others  PARTICIPATION LIMITATIONS: meal prep, cleaning, laundry, medication management, personal finances, driving, shopping, community activity, and yard work  PERSONAL FACTORS Time since onset of injury/illness/exacerbation are also affecting patient's functional outcome.   REHAB POTENTIAL: Fair    CLINICAL DECISION MAKING: Evolving/moderate complexity  EVALUATION COMPLEXITY: Moderate  PLAN: PT FREQUENCY: 2x/week  PT DURATION: 12 weeks  PLANNED INTERVENTIONS: Therapeutic exercises, Therapeutic activity, Neuromuscular re-education, Balance training, Gait training, Patient/Family education, Self Care, Joint mobilization, Joint manipulation, Stair training, Vestibular training, Canalith repositioning, DME instructions, Dry Needling, Electrical stimulation, Spinal manipulation, Spinal mobilization, Cryotherapy, Moist heat, Manual therapy, and Re-evaluation  PLAN FOR NEXT SESSION: Continue POC, work on speed up when doing blaze obstacle courses.    Particia Lather, PT 05/09/2022, 2:51 PM

## 2022-05-11 ENCOUNTER — Ambulatory Visit: Payer: Medicare PPO

## 2022-05-11 ENCOUNTER — Ambulatory Visit: Payer: Medicare PPO | Admitting: Physical Therapy

## 2022-05-11 ENCOUNTER — Encounter: Payer: Self-pay | Admitting: Physical Therapy

## 2022-05-11 DIAGNOSIS — R2681 Unsteadiness on feet: Secondary | ICD-10-CM

## 2022-05-11 DIAGNOSIS — M6281 Muscle weakness (generalized): Secondary | ICD-10-CM | POA: Diagnosis not present

## 2022-05-11 DIAGNOSIS — R278 Other lack of coordination: Secondary | ICD-10-CM | POA: Diagnosis not present

## 2022-05-11 DIAGNOSIS — R269 Unspecified abnormalities of gait and mobility: Secondary | ICD-10-CM | POA: Diagnosis not present

## 2022-05-11 DIAGNOSIS — G20A1 Parkinson's disease without dyskinesia, without mention of fluctuations: Secondary | ICD-10-CM | POA: Diagnosis not present

## 2022-05-11 DIAGNOSIS — R262 Difficulty in walking, not elsewhere classified: Secondary | ICD-10-CM | POA: Diagnosis not present

## 2022-05-11 DIAGNOSIS — R296 Repeated falls: Secondary | ICD-10-CM

## 2022-05-11 NOTE — Therapy (Signed)
OUTPATIENT PHYSICAL THERAPY NEURO Treatment     Patient Name: [name] MRN: [id number] DOB:March 11, 1951, 72 y.o., male Today's Date: 05/11/2022   PCP: Dr. Elsie Stain REFERRING PROVIDER: Dr. Sharolyn Douglas   PT End of Session - 05/11/22 1119     Visit Number 15    Number of Visits 24    Date for PT Re-Evaluation 05/23/22    Authorization Type [employer] Choice PPO    Authorization Time Period Cert through 05/25/49    Progress Note Due on Visit 20    PT Start Time 1145    PT Stop Time 1228    PT Time Calculation (min) 43 min    Equipment Utilized During Treatment Gait belt    Activity Tolerance Patient tolerated treatment well    Behavior During Therapy WFL for tasks assessed/performed                       Past Medical History:  Diagnosis Date   Arthritis    Bradycardia    Cancer ([place]) 2013   skin cancer   Depression    ptsd   Dysrhythmia    chronic slow heart rate   GERD (gastroesophageal reflux disease)    Headache(784.0)    tension headaches non recent   History of chicken pox    History of kidney stones    passed   Hypertension    treated with HCTZ   Pacemaker    Parkinson's disease    dx'ed 15 years ago   PTSD (post-traumatic stress disorder)    Shortness of breath dyspnea    Sleep apnea    doesn't use C-pap   Varicose veins    Past Surgical History:  Procedure Laterality Date   CHOLECYSTECTOMY N/A 10/22/2014   Procedure: LAPAROSCOPIC CHOLECYSTECTOMY WITH INTRAOPERATIVE CHOLANGIOGRAM;  Surgeon: Dia Crawford III, MD;  Location: [facility];  Service: General;  Laterality: N/A;   cyst removed      from lip as a child   INTRAMEDULLARY (IM) NAIL INTERTROCHANTERIC N/A 02/18/2019   Procedure: INTRAMEDULLARY (IM) NAIL INTERTROCHANTRIC, RIGHT,;  Surgeon: Thornton Park, MD;  Location: [facility];  Service: Orthopedics;  Laterality: N/A;   LUMBAR LAMINECTOMY/DECOMPRESSION MICRODISCECTOMY Bilateral 12/14/2012   Procedure: Bilateral lumbar  three-four, four-five decompressive laminotomy/foraminotomy;  Surgeon: Charlie Pitter, MD;  Location: [place] [facility];  Service: Neurosurgery;  Laterality: Bilateral;   PULSE GENERATOR IMPLANT Bilateral 12/13/2013   Procedure: Bilateral implantable pulse generator placement;  Surgeon: Erline Levine, MD;  Location: [place] [facility];  Service: Neurosurgery;  Laterality: Bilateral;  Bilateral implantable pulse generator placement   skin cancer removed     from ears,   12 lft arm  rt leg 15   SUBTHALAMIC STIMULATOR BATTERY REPLACEMENT Bilateral 07/14/2017   Procedure: BILATERAL IMPLANTED PULSE GENERATOR CHANGE FOR DEEP BRAIN STIMULATOR;  Surgeon: Erline Levine, MD;  Location: [place];  Service: Neurosurgery;  Laterality: Bilateral;   SUBTHALAMIC STIMULATOR INSERTION Bilateral 12/06/2013   Procedure: SUBTHALAMIC STIMULATOR INSERTION;  Surgeon: Erline Levine, MD;  Location: [place] [facility];  Service: Neurosurgery;  Laterality: Bilateral;  Bilateral deep brain stimulator placement   Patient Active Problem List   Diagnosis Date Noted   Hematuria 10/10/2021   Sinus drainage 12/09/2020   Medicare annual wellness visit, subsequent 11/18/2020   Pacemaker    Dementia associated with Parkinson's disease ([place]) 06/17/2020   Vertigo 02/19/2020   Urine abnormality 02/19/2020   Dysphagia 02/23/2019   Fall at home 10/18/2017   REM behavioral  disorder 11/02/2016   Radicular pain in right arm 10/09/2016   GERD (gastroesophageal reflux disease) 07/31/2016   Colon cancer screening 07/23/2015   Gout 02/18/2015   Depression 01/06/2015   Skin lesion 10/02/2014   Dupuytren's contracture 08/20/2014   Cough 07/21/2014   Lumbar stenosis with neurogenic claudication 06/13/2014   S/P deep brain stimulator placement 05/08/2014   Advance care planning 01/19/2014   Parkinson's disease 12/13/2013   PTSD (post-traumatic stress disorder) 06/13/2013   Erectile dysfunction 06/13/2013   HLD (hyperlipidemia) 06/13/2013   Essential  hypertension 06/03/2013   Bradycardia by electrocardiogram 06/03/2013   Obstructive sleep apnea 03/12/2013    ONSET DATE: 15 years ago per chart  REFERRING DIAG: Parkinson's disease  THERAPY DIAG:  Unsteadiness on feet  Repeated falls  Abnormality of gait and mobility  Difficulty in walking, not elsewhere classified  Muscle weakness (generalized)  Rationale for Evaluation and Treatment Rehabilitation  SUBJECTIVE:                                                                                                                                                                                              SUBJECTIVE STATEMENT: Pt reports one fall sicne last session where he got up and attempted to ambulate without assistive device and reached for broom and fell. Pt has some soreness in back and legs but no other known injury.   Pt accompanied by: significant other and [name] (caretaker)  PERTINENT HISTORY:   Date of most recent Admission: 09/25/2021 Date of Discharge: 09/28/2021  Discharge Diagnoses:  Altered mental status Parkinson disease (CMS/HCC) Hallucinations  Per [facility] discharge summary notes PHI : Hospital Course:   72 y.o. male with a history of Parkinson's Disease w/ DBS in place, PD-related dementia, OSA, depression, and seizure disorder who presented for EEG given concern for seizures.  Altered mental status Concern seizure activity Periods of unresponsiveness on the setting of new medication--Nuplazid (pimavanserin) recently started. No events of note on continuous EEG. This was held and he returned to his baseline mental status. Other workup including CT head, CTA h/n without obvious cause for periodic unresponsiveness. Nuplazid was discontinued.   Aspiration, weakness Of note, he had a coughing spell while eating though sounds like was precipitated by sneezing. Did well on RA, seen by SLP without any further needs or concerns.  Parkinson's  Disease Parkinson's-related dementia - He follows in the [facility] Disorders center at [facility]. Clinic notes suggest he seems to have worsening of his overall disease trajectory. - Continued home Memantine and Sinemet   PMH: HTN, bradycardia s/p PPM, PD, DBS, COVID-19 infection, chronic low back pain s/p  L4/5 surgery.  PAIN:  Are you having pain? Yes: NPRS scale: 7/10 Pain location: Low back Pain description: ache Aggravating factors: Prolonged standing/sitting, bending over Relieving factors: rest, changing positions  PRECAUTIONS: Fall  WEIGHT BEARING RESTRICTIONS No  FALLS: Has patient fallen in last 6 months? Yes. Number of falls >10  LIVING ENVIRONMENT: Lives with: lives with their spouse Lives in: House/apartment Stairs:  Ramp Has following equipment at home: Single point cane, Environmental consultant - 4 wheeled, Wheelchair (manual), shower chair, bed side commode, Grab bars, and Ramped entry  PLOF: Needs assistance with ADLs, Needs assistance with homemaking, Needs assistance with gait, and Needs assistance with transfers  PATIENT GOALS To walk better and not fall. Improve fall frequency to prevent significant injury   OBJECTIVE: (objective measures completed at initial evaluation unless otherwise dated)   DIAGNOSTIC FINDINGS:   COGNITION: Overall cognitive status: History of cognitive impairments - at baseline   SENSATION: Light touch: WFL  COORDINATION: Decreased and slow to initiate  EDEMA:  None observed     POSTURE: rounded shoulders, forward head, increased thoracic kyphosis, and flexed trunk      TRANSFERS: Assistive device utilized:  Upright 4WW   Sit to stand:  Min/mod assist- with increased VC for safety Stand to sit:  Min assist with Verbal cues to lock brakes and reach back Chair to chair: Min A     GAIT: Gait pattern: decreased arm swing- Right, decreased arm swing- Left, decreased step length- Right, decreased step length- Left, decreased stance time-  Right, decreased stance time- Left, decreased stride length, and shuffling Distance walked: 150 ft. Assistive device utilized:  upright 4WW Level of assistance: Min A Comments: Shuffling gait with slow gait speed  FUNCTIONAL TESTs:  5 times sit to stand: 19.5 sec with UE support with BUE support Timed up and go (TUG): 37.26 sec with upright 4WW 6 minute walk test: 720 feet using upright 4WW 10 meter walk test: 0.67 m/s  PATIENT SURVEYS:  FOTO 50  TODAY'S TREATMENT:  05/11/22  NMR  Activity Description: balance obstacle course with challenging pt for timing. Challenging pt tp remember walker following obstacles for carryover to home use of walker for automaticity. Basketball shot, boxing speed bag, and ascending and descending stairs  Activity Setting:  random  Number of Pods:  3 Cycles/Sets:  4 Duration (Time or Hit Count):  10  Patient Stats  5:30 first round  4:50 each of last 3 rounds with no rest between round 3 and 4   Seated on dynadisc for postural activation  Rows 2 x 15 reps     PATIENT EDUCATION: Education details: PT plan of care; purpose of functional outcomes Person educated: Patient and Spouse Education method: Explanation and Verbal cues Education comprehension: verbalized understanding   HOME EXERCISE PROGRAM: Access Code: [email or web address] URL: [email or web address] Date: 04/20/2022 Prepared by: Rivka Barbara  Exercises - Sit to stand, walk with assistive device to another chair, sit and repeat.   - 1 x daily - 7 x weekly - 2 sets - 10 reps - Standing March with Counter Support  - 1 x daily - 7 x weekly - 3 sets - 10 reps - Standing in "bowling stance" with counter support   - 1 x daily - 7 x weekly - 3 sets - 10 reps - Heel Raises with Counter Support  - 1 x daily - 7 x weekly - 3 sets - 10 reps - Seated shoulder row while sitting on stability disc   - 1  x daily - 7 x weekly - 3 sets - 10 reps    GOALS: Goals reviewed with patient?  Yes  SHORT TERM GOALS: Target date: 01/11/2022  Pt will be independent with HEP in order to improve strength and balance in order to decrease fall risk and improve function at home and work.  Baseline: Patient reports not performing much in the way of any HEP except walking. 12/27: Having phases of participation and phases of refusal to participate.  Goal status: IN PROGRESS  LONG TERM GOALS: Target date: 02/22/2022  Pt will decrease 5TSTS by at least 5 seconds in order to demonstrate clinically significant improvement in LE strength. Baseline: 02/28/2022: 19.5 sec with UE support  04/13/22:11.4 sec  Goal status: IN PROGRESS- continue to monitor for consistent progression   2.  Pt will improve FOTO to target score of 48 to display perceived improvements in ability to complete ADL's.  Baseline: 02/28/22=40 12/27: 45 Goal status: IN PROGRESS  3.  Pt will decrease TUG to < or = to 25 seconds/decrease in order to demonstrate decreased fall risk. Baseline: 02/28/22= 37.26 sec using upright 4WW 12/27:20.21 sec  Goal status: IN PROGRESS Keep assessing to ensure progress is maintained   4.  Pt will increase 10MWT by at least 0.13 m/s in order to demonstrate clinically significant improvement in community ambulation.   Baseline: 02/28/22= 0.67 m/s using upright 4WW 12/27: .89 m/s  Goal status: MET  5.  Patient will increase six minute walk test distance to >1000 for progression to community ambulator and improve gait ability  Baseline: 02/28/22: 720 feet with up walker 04/13/22: 815 feet with up walker  Goal status: IN PROGRESS  6.  Patient will experience 1 fall or less per 2 week period for at least 3 weeks in order to improve his frequency of falls and prevent injury from falls  Baseline:04/13/22: Pt has had 1 fall in the last 2 weeks to his knowledge Goal status: INITIAL  ASSESSMENT:  CLINICAL IMPRESSION:  Patient presents with good motivation for completion of physical therapy  activities.  Pt continues with training for increased utilization of standign frame u step walker. Pt shows good recall of walker this date despite challenge of rushing through obstacles. Pt will continue to benefit from skilled physical therapy intervention to address impairments, improve QOL, and attain therapy goals.     OBJECTIVE IMPAIRMENTS Abnormal gait, decreased activity tolerance, decreased balance, decreased cognition, decreased coordination, decreased endurance, decreased mobility, difficulty walking, decreased ROM, decreased strength, decreased safety awareness, hypomobility, impaired perceived functional ability, and pain.   ACTIVITY LIMITATIONS carrying, lifting, bending, standing, squatting, stairs, transfers, bed mobility, bathing, toileting, dressing, self feeding, hygiene/grooming, and caring for others  PARTICIPATION LIMITATIONS: meal prep, cleaning, laundry, medication management, personal finances, driving, shopping, community activity, and yard work  PERSONAL FACTORS Time since onset of injury/illness/exacerbation are also affecting patient's functional outcome.   REHAB POTENTIAL: Fair    CLINICAL DECISION MAKING: Evolving/moderate complexity  EVALUATION COMPLEXITY: Moderate  PLAN: PT FREQUENCY: 2x/week  PT DURATION: 12 weeks  PLANNED INTERVENTIONS: Therapeutic exercises, Therapeutic activity, Neuromuscular re-education, Balance training, Gait training, Patient/Family education, Self Care, Joint mobilization, Joint manipulation, Stair training, Vestibular training, Canalith repositioning, DME instructions, Dry Needling, Electrical stimulation, Spinal manipulation, Spinal mobilization, Cryotherapy, Moist heat, Manual therapy, and Re-evaluation  PLAN FOR NEXT SESSION: Continue POC, work on speed up when doing blaze obstacle courses.   Particia Lather, PT 05/11/2022, 12:20 PM

## 2022-05-12 NOTE — Therapy (Signed)
OUTPATIENT OCCUPATIONAL THERAPY NEURO TREATMENT NOTE  Patient Name: [name] MRN: [id number] DOB:02/17/1951, 72 y.o., male Today's Date: 05/12/2022  PCP: Dr. Elsie Stain REFERRING PROVIDER: Dr. Elsie Stain   OT End of Session - 05/12/22 1050     Visit Number 16    Number of Visits 24    Date for OT Re-Evaluation 05/23/22    Authorization Time Period Reporting period beginning 04/13/22    OT Start Time 85    OT Stop Time 1145    OT Time Calculation (min) 45 min    Equipment Utilized During Treatment rollator    Activity Tolerance Patient tolerated treatment well    Behavior During Therapy [facility] for tasks assessed/performed                Past Medical History:  Diagnosis Date   Arthritis    Bradycardia    Cancer ([place]) 2013   skin cancer   Depression    ptsd   Dysrhythmia    chronic slow heart rate   GERD (gastroesophageal reflux disease)    Headache(784.0)    tension headaches non recent   History of chicken pox    History of kidney stones    passed   Hypertension    treated with HCTZ   Pacemaker    Parkinson's disease    dx'ed 15 years ago   PTSD (post-traumatic stress disorder)    Shortness of breath dyspnea    Sleep apnea    doesn't use C-pap   Varicose veins    Past Surgical History:  Procedure Laterality Date   CHOLECYSTECTOMY N/A 10/22/2014   Procedure: LAPAROSCOPIC CHOLECYSTECTOMY WITH INTRAOPERATIVE CHOLANGIOGRAM;  Surgeon: Dia Crawford III, MD;  Location: [facility] ORS;  Service: General;  Laterality: N/A;   cyst removed      from lip as a child   INTRAMEDULLARY (IM) NAIL INTERTROCHANTERIC N/A 02/18/2019   Procedure: INTRAMEDULLARY (IM) NAIL INTERTROCHANTRIC, RIGHT,;  Surgeon: Thornton Park, MD;  Location: [facility] ORS;  Service: Orthopedics;  Laterality: N/A;   LUMBAR LAMINECTOMY/DECOMPRESSION MICRODISCECTOMY Bilateral 12/14/2012   Procedure: Bilateral lumbar three-four, four-five decompressive laminotomy/foraminotomy;  Surgeon: Charlie Pitter, MD;   Location: [place] NEURO ORS;  Service: Neurosurgery;  Laterality: Bilateral;   PULSE GENERATOR IMPLANT Bilateral 12/13/2013   Procedure: Bilateral implantable pulse generator placement;  Surgeon: Erline Levine, MD;  Location: [place] [facility]URO ORS;  Service: Neurosurgery;  Laterality: Bilateral;  Bilateral implantable pulse generator placement   skin cancer removed     from ears,   12 lft arm  rt leg 15   SUBTHALAMIC STIMULATOR BATTERY REPLACEMENT Bilateral 07/14/2017   Procedure: BILATERAL IMPLANTED PULSE GENERATOR CHANGE FOR DEEP BRAIN STIMULATOR;  Surgeon: Erline Levine, MD;  Location: [place];  Service: Neurosurgery;  Laterality: Bilateral;   SUBTHALAMIC STIMULATOR INSERTION Bilateral 12/06/2013   Procedure: SUBTHALAMIC STIMULATOR INSERTION;  Surgeon: Erline Levine, MD;  Location: [place] NEURO ORS;  Service: Neurosurgery;  Laterality: Bilateral;  Bilateral deep brain stimulator placement   Patient Active Problem List   Diagnosis Date Noted   Hematuria 10/10/2021   Sinus drainage 12/09/2020   Medicare annual wellness visit, subsequent 11/18/2020   Pacemaker    Dementia associated with Parkinson's disease ([place]) 06/17/2020   Vertigo 02/19/2020   Urine abnormality 02/19/2020   Dysphagia 02/23/2019   Fall at home 10/18/2017   REM behavioral disorder 11/02/2016   Radicular pain in right arm 10/09/2016   GERD (gastroesophageal reflux disease) 07/31/2016   Colon cancer screening 07/23/2015   Gout 02/18/2015  Depression 01/06/2015   Skin lesion 10/02/2014   Dupuytren's contracture 08/20/2014   Cough 07/21/2014   Lumbar stenosis with neurogenic claudication 06/13/2014   S/P deep brain stimulator placement 05/08/2014   Advance care planning 01/19/2014   Parkinson's disease 12/13/2013   PTSD (post-traumatic stress disorder) 06/13/2013   Erectile dysfunction 06/13/2013   HLD (hyperlipidemia) 06/13/2013   Essential hypertension 06/03/2013   Bradycardia by electrocardiogram 06/03/2013   Obstructive sleep apnea  03/12/2013    ONSET DATE: 2015  REFERRING DIAG: Parkinson's Disease  THERAPY DIAG:  Muscle weakness (generalized)  Other lack of coordination  Parkinson's disease without dyskinesia or fluctuating manifestations  Rationale for Evaluation and Treatment: Rehabilitation  SUBJECTIVE: Caregiver reports pt had another fall yesterday.  No significant injury.   SUBJECTIVE STATEMENT: Pt accompanied by:  caregiver [name])  PERTINENT HISTORY: Parkinson's Disease, dementia, DBS, multiple falls  PRECAUTIONS: Fall  WEIGHT BEARING RESTRICTIONS: No  PAIN:  Are you having pain? Yes: NPRS scale: 4/10 Pain location: hips and low back, R knee Pain description: sharp, tight Aggravating factors: if pt crosses feet too long/awkward positioning Relieving factors: voltaren gel   FALLS: Has patient fallen in last 6 months? Yes. Number of falls : spouse estimates 20  LIVING ENVIRONMENT: Lives with: lives with their spouse Lives in: house, 1 level Stairs: No Has following equipment at home: [place] entry, handicapped accessible bathroom  PLOF: Needs assistance with ADLs  PATIENT GOALS: increase bilat hand coordination for ADLs  OBJECTIVE:   HAND DOMINANCE: Right  ADLs: Overall ADLs: caregiver assist for all ADLs; pt currently as 16 hrs of caregiver assist per week through the [place].  Pt recently was assigned 100% disability by the [place], and spouse reports caregiver hours will increase to 46 per week, but uncertain when Transfers/ambulation related to ADLs: supv; pt uses upright 4 wheeled walker Eating: caregivers help to cut food Grooming: mod A to shave with an electric razor UB Dressing: min A, difficulty with buttons LB Dressing: mod A, difficulty with zippers and buttons Toileting: occasional accidents with bowel and bladder, but less than weekly and only during the night; distant supv for toileting during the day Bathing: min A for bathing, mod A for drying off Tub Shower transfers:  min guard for shower transfers with grab bar and shower chair Equipment:  fully handicapped shower and toilet  IADLs: Shopping: dep on caregiver Light housekeeping: dep Meal Prep: dep Community mobility: short distance community amb with supv and 4 wheeled walker Medication management: dep Financial management: dep Handwriting:  printed first/last name 50% legible, cursive first/last name illegible  MOBILITY STATUS: Hx of falls  POSTURE COMMENTS:  rounded shoulders, forward head, increased thoracic kyphosis, and flexed trunk   FUNCTIONAL OUTCOME MEASURES: FOTO: 40.2 (completed with PT); predicted 48  UPPER EXTREMITY ROM:   Pt demos BUE shoulder ROM WFL for self care  UPPER EXTREMITY MMT:     MMT Right eval Left eval  Shoulder flexion 5 5  Shoulder abduction 5 5  Shoulder adduction    Shoulder extension    Shoulder internal rotation    Shoulder external rotation    Middle trapezius    Lower trapezius    Elbow flexion 5 5  Elbow extension 5 5  Wrist flexion 5 5  Wrist extension 5 5  Wrist ulnar deviation    Wrist radial deviation    Wrist pronation    Wrist supination    (Blank rows = not tested)  HAND FUNCTION: Grip strength: Right: 61 lbs;  Left: 61 lbs, Lateral pinch: Right: 20 lbs, Left: 19 lbs, and 3 point pinch: Right: 11 lbs, Left: 15 lbs 04/13/22: Grip strength: Right: 66 lbs; Left: 65 lbs, Lateral pinch: Right: 18 lbs, Left: 18 lbs, and 3 point pinch: Right: 14 lbs, Left: 15 lbs  COORDINATION: Finger Nose Finger test: increased time, L side touched lower than tip, corrected with min vc 9 Hole Peg test: Right: 1 min 18 sec; Left: 41 sec 04/13/22: 9 Hole Peg test: Right: 1 min 4 sec; Left: 37 sec   SENSATION: Proprioception: Impaired   EDEMA: none  COGNITION: Overall cognitive status: History of cognitive impairments - at baseline; Parkinson's with dementia  VISION: Wears prism glasses; hx of visual hallucinations  PRAXIS: Impaired: Motor  planning; bradykinesia, R>L  TODAY'S TREATMENT:                                                                                                                               Therapeutic Exercise: OT provided written HEP handout with pictures for blue theraputty exercises, per caregiver request, and reviewed exercises from last session and provided additional.  Handout included gross grasping, lateral/2 point/3 point pinching, digit abd/add, and digging coins out of putty.  Able to return demo with mod-max vc and demo for technique to improve quality of movement.  Encouraged completion 5-10 min, 1-2x per day.  Caregiver inquired about whether pt could perform these while watching tv.  Anticipate tv would be too much of a distraction d/t pt's struggle with dual tasking.  Advised completion of only the 1 step putty exercises (gripping or pinching) while watching tv, and otherwise recommend to perform these while listening to music.  Caregiver verbalized understanding.  Self Care: Practiced clasping/unclasping pt's necklace whi medium size clasp.  Pt was cued to bring clasp in front of chest rather than work with it near his R shoulder which he initially attempted.  Pt was unable to manage the clasp without clasp in his view, so OT assisted pt to remove necklace from neck and practiced clasping/unclasping at table top level.  Pt was managed to clasp/unclasp 1 time with mod tactile and vc for technique.  PATIENT EDUCATION: Education details: HEP progression Person educated: caregiver, [name] Education method: Explanation, written handout Education comprehension: verbalized understanding  HOME EXERCISE PROGRAM: To be established in the next 1-2 sessions   GOALS: Goals reviewed with patient? Yes  SHORT TERM GOALS: Target date: 04/11/22  1. Pt will complete HEP for increasing [place] and strength in bilat hands with direct supv at least 4 of 7 days per week. Baseline: not yet initiated; 04/03/22:  caregiver reports pt has putty but rarely uses Goal status: ongoing  2. Pt will clean his glasses with min A and mod vc, including opening cleaning packet.   Baseline: max A; 04/13/22: able to clean with set up/supv with min vc.   Goal status: achieved   LONG TERM GOALS: Target date: 05/23/22  Pt will increase FOTO score by 5 or more points to indicate increase in self/caregiver perceived functional performance with daily tasks.  Baseline: 40.2 (predicted 48);  Goal status: INITIAL  2.  Pt will increase R/L lateral pinch strength by 4 or more lbs to enable pt to independently turn on electric toothbrush and twist open toothpaste. Baseline: R 20, L 19; caregiver assist to turn on toothbrush and open toothpaste; 04/13/22: B 18 lbs, pt still struggles with above activities Goal status: INITIAL  3.  Pt will increase [place] in bilat hands to manage buttons and zippers on clothing 75% of the time.  Baseline: caregivers assist with clothing fasteners at least 50% of the time; 04/13/22: caregiver reports pt requires assist to set up zipper on coat, and pt can manage buttons on jeans and zipper, occasionally having to lie flat on back to manage fasteners.  Pt can manage buttons with extra time on shirt (9 hole: R 1 min 4 sec, L 37 sec) Goal status: ongoing  4.  Pt will scoop food from plate/bowl with min vc using adapted utensils and adapted plates/bowls as needed.  Baseline: caregiver constantly turns plate to compensate for pt pushing food to 1 side; 04/13/22: pt now uses divided plate which allows pt to scoop his food without having spouse turn his plate or bowl.  Pt continues to use built up eating utensils. Goal status: achieved  5.  Pt will shave with electric razor with min A for thoroughness. Baseline: pt struggles to grade sufficient pressure to thoroughly shave face, requiring mod A; 04/13/22: Spouse is waiting for a nicer razor from the [place], so last few times barber will shave him when he's  getting a hair cut.   Goal status: ongoing  ASSESSMENT:  CLINICAL IMPRESSION: Theraputty HEP reviewed with pt and caregiver with handout issued.  Encouraged pt/caregiver perform with few distractions with recommendation to perform while listening to music rather than while watching tv as pt struggles to dual task.  Practiced today with managing a clasp on pt's necklace.  Necklace required removal from neck for pt to view clasp.  Pt was able to manage the clasp 1x with extra time and verbal and tactile cues for technique.  Will continue to provide dual tasking activities to increase standing tolerance, balance, and activity tolerance for improved indep and decreased fall risk with standing ADLs.  Pt. continues to benefit from OT services to maximize hand strength and coordination for daily tasks, and provide strategies to maximize indep with ADLs and leisure activities to increase QOL.  PERFORMANCE DEFICITS: in functional skills including ADLs, IADLs, coordination, dexterity, proprioception, strength, pain, Fine motor control, Gross motor control, mobility, balance, body mechanics, endurance, decreased knowledge of use of DME, and UE functional use, cognitive skills including attention, learn, memory, orientation, problem solving, safety awareness, thought, and understand. IMPAIRMENTS: are limiting patient from ADLs, IADLs, rest and sleep, leisure, and social participation.   CO-MORBIDITIES: has co-morbidities such as dementia  that affects occupational performance. Patient will benefit from skilled OT to address above impairments and improve overall function.  MODIFICATION OR ASSISTANCE TO COMPLETE EVALUATION: Min-Moderate modification of tasks or assist with assess necessary to complete an evaluation.  OT OCCUPATIONAL PROFILE AND HISTORY: Problem focused assessment: Including review of records relating to presenting problem.  CLINICAL DECISION MAKING: Moderate - several treatment options, min-mod  task modification necessary  REHAB POTENTIAL: Good  EVALUATION COMPLEXITY: Moderate    PLAN:  OT FREQUENCY: 2x/week  OT DURATION: 12 weeks  PLANNED INTERVENTIONS: self care/ADL training, therapeutic exercise, therapeutic activity, neuromuscular re-education, manual therapy, balance training, functional mobility training, moist heat, cryotherapy, patient/family education, cognitive remediation/compensation, energy conservation, coping strategies training, and DME and/or AE instructions  RECOMMENDED OTHER SERVICES: N/A  CONSULTED AND AGREED WITH PLAN OF CARE: Patient and family member/caregiver  PLAN FOR NEXT SESSION: see eval  Leta Speller, MS, OTR/L  Darleene Cleaver, OT 05/12/2022, 10:52 AM

## 2022-05-13 NOTE — Therapy (Unsigned)
OUTPATIENT PHYSICAL THERAPY NEURO Treatment     Patient Name: [name] MRN: [id number] DOB:1951-02-28, 72 y.o., male Today's Date: 05/16/2022   PCP: Dr. Elsie Stain REFERRING PROVIDER: Dr. Sharolyn Douglas   PT End of Session - 05/16/22 1148     Visit Number 16    Number of Visits 24    Date for PT Re-Evaluation 05/23/22    Authorization Type [employer] Choice PPO    Authorization Time Period Cert through 06/21/12    Progress Note Due on Visit 20    PT Start Time 1147    PT Stop Time 1228    PT Time Calculation (min) 41 min    Equipment Utilized During Treatment Gait belt    Activity Tolerance Patient tolerated treatment well    Behavior During Therapy WFL for tasks assessed/performed                        Past Medical History:  Diagnosis Date   Arthritis    Bradycardia    Cancer ([place]) 2013   skin cancer   Depression    ptsd   Dysrhythmia    chronic slow heart rate   GERD (gastroesophageal reflux disease)    Headache(784.0)    tension headaches non recent   History of chicken pox    History of kidney stones    passed   Hypertension    treated with HCTZ   Pacemaker    Parkinson's disease    dx'ed 15 years ago   PTSD (post-traumatic stress disorder)    Shortness of breath dyspnea    Sleep apnea    doesn't use C-pap   Varicose veins    Past Surgical History:  Procedure Laterality Date   CHOLECYSTECTOMY N/A 10/22/2014   Procedure: LAPAROSCOPIC CHOLECYSTECTOMY WITH INTRAOPERATIVE CHOLANGIOGRAM;  Surgeon: Dia Crawford III, MD;  Location: [facility];  Service: General;  Laterality: N/A;   cyst removed      from lip as a child   INTRAMEDULLARY (IM) NAIL INTERTROCHANTERIC N/A 02/18/2019   Procedure: INTRAMEDULLARY (IM) NAIL INTERTROCHANTRIC, RIGHT,;  Surgeon: Thornton Park, MD;  Location: [facility];  Service: Orthopedics;  Laterality: N/A;   LUMBAR LAMINECTOMY/DECOMPRESSION MICRODISCECTOMY Bilateral 12/14/2012   Procedure: Bilateral lumbar  three-four, four-five decompressive laminotomy/foraminotomy;  Surgeon: Charlie Pitter, MD;  Location: [place] [facility];  Service: Neurosurgery;  Laterality: Bilateral;   PULSE GENERATOR IMPLANT Bilateral 12/13/2013   Procedure: Bilateral implantable pulse generator placement;  Surgeon: Erline Levine, MD;  Location: [place] [facility];  Service: Neurosurgery;  Laterality: Bilateral;  Bilateral implantable pulse generator placement   skin cancer removed     from ears,   12 lft arm  rt leg 15   SUBTHALAMIC STIMULATOR BATTERY REPLACEMENT Bilateral 07/14/2017   Procedure: BILATERAL IMPLANTED PULSE GENERATOR CHANGE FOR DEEP BRAIN STIMULATOR;  Surgeon: Erline Levine, MD;  Location: [facility];  Service: Neurosurgery;  Laterality: Bilateral;   SUBTHALAMIC STIMULATOR INSERTION Bilateral 12/06/2013   Procedure: SUBTHALAMIC STIMULATOR INSERTION;  Surgeon: Erline Levine, MD;  Location: [place];  Service: Neurosurgery;  Laterality: Bilateral;  Bilateral deep brain stimulator placement   Patient Active Problem List   Diagnosis Date Noted   Hematuria 10/10/2021   Sinus drainage 12/09/2020   Medicare annual wellness visit, subsequent 11/18/2020   Pacemaker    Dementia associated with Parkinson's disease ([place]) 06/17/2020   Vertigo 02/19/2020   Urine abnormality 02/19/2020   Dysphagia 02/23/2019   Fall at home 10/18/2017   REM  behavioral disorder 11/02/2016   Radicular pain in right arm 10/09/2016   GERD (gastroesophageal reflux disease) 07/31/2016   Colon cancer screening 07/23/2015   Gout 02/18/2015   Depression 01/06/2015   Skin lesion 10/02/2014   Dupuytren's contracture 08/20/2014   Cough 07/21/2014   Lumbar stenosis with neurogenic claudication 06/13/2014   S/P deep brain stimulator placement 05/08/2014   Advance care planning 01/19/2014   Parkinson's disease 12/13/2013   PTSD (post-traumatic stress disorder) 06/13/2013   Erectile dysfunction 06/13/2013   HLD (hyperlipidemia) 06/13/2013   Essential  hypertension 06/03/2013   Bradycardia by electrocardiogram 06/03/2013   Obstructive sleep apnea 03/12/2013    ONSET DATE: 15 years ago per chart  REFERRING DIAG: Parkinson's disease  THERAPY DIAG:  Unsteadiness on feet  Repeated falls  Abnormality of gait and mobility  Difficulty in walking, not elsewhere classified  Muscle weakness (generalized)  Rationale for Evaluation and Treatment Rehabilitation  SUBJECTIVE:                                                                                                                                                                                              SUBJECTIVE STATEMENT: Pt reports no new falls since last session.  Reports he is having a good day.   Pt accompanied by: significant other and [name] (caretaker)  PERTINENT HISTORY:   Date of most recent Admission: 09/25/2021 Date of Discharge: 09/28/2021  Discharge Diagnoses:  Altered mental status Parkinson disease (CMS/HCC) Hallucinations  Per [facility] discharge summary notes PHI : Hospital Course:   72 y.o. male with a history of Parkinson's Disease w/ DBS in place, PD-related dementia, OSA, depression, and seizure disorder who presented for EEG given concern for seizures.  Altered mental status Concern seizure activity Periods of unresponsiveness on the setting of new medication--Nuplazid (pimavanserin) recently started. No events of note on continuous EEG. This was held and he returned to his baseline mental status. Other workup including CT head, CTA h/n without obvious cause for periodic unresponsiveness. Nuplazid was discontinued.   Aspiration, weakness Of note, he had a coughing spell while eating though sounds like was precipitated by sneezing. Did well on RA, seen by SLP without any further needs or concerns.  Parkinson's Disease Parkinson's-related dementia - He follows in the Movement Disorders center at [facility]. Clinic notes suggest he seems to have worsening of  his overall disease trajectory. - Continued home Memantine and Sinemet   PMH: HTN, bradycardia s/p PPM, PD, DBS, COVID-19 infection, chronic low back pain s/p L4/5 surgery.  PAIN:  Are you having pain? Yes: NPRS scale: 7/10 Pain location: Low back Pain description: ache  Aggravating factors: Prolonged standing/sitting, bending over Relieving factors: rest, changing positions  PRECAUTIONS: Fall  WEIGHT BEARING RESTRICTIONS No  FALLS: Has patient fallen in last 6 months? Yes. Number of falls >10  LIVING ENVIRONMENT: Lives with: lives with their spouse Lives in: House/apartment Stairs:  Ramp Has following equipment at home: Single point cane, Environmental consultant - 4 wheeled, Wheelchair (manual), shower chair, bed side commode, Grab bars, and Ramped entry  PLOF: Needs assistance with ADLs, Needs assistance with homemaking, Needs assistance with gait, and Needs assistance with transfers  PATIENT GOALS To walk better and not fall. Improve fall frequency to prevent significant injury   OBJECTIVE: (objective measures completed at initial evaluation unless otherwise dated)   DIAGNOSTIC FINDINGS:   COGNITION: Overall cognitive status: History of cognitive impairments - at baseline   SENSATION: Light touch: WFL  COORDINATION: Decreased and slow to initiate  EDEMA:  None observed     POSTURE: rounded shoulders, forward head, increased thoracic kyphosis, and flexed trunk      TRANSFERS: Assistive device utilized:  Upright 4WW   Sit to stand:  Min/mod assist- with increased VC for safety Stand to sit:  Min assist with Verbal cues to lock brakes and reach back Chair to chair: Min A     GAIT: Gait pattern: decreased arm swing- Right, decreased arm swing- Left, decreased step length- Right, decreased step length- Left, decreased stance time- Right, decreased stance time- Left, decreased stride length, and shuffling Distance walked: 150 ft. Assistive device utilized:  upright  4WW Level of assistance: Min A Comments: Shuffling gait with slow gait speed  FUNCTIONAL TESTs:  5 times sit to stand: 19.5 sec with UE support with BUE support Timed up and go (TUG): 37.26 sec with upright 4WW 6 minute walk test: 720 feet using upright 4WW 10 meter walk test: 0.67 m/s  PATIENT SURVEYS:  FOTO 50  TODAY'S TREATMENT:  05/16/22  TE  Ambulation long loop through EVS hallway with 2# AW donned (approx 450 feet)  STS x 10 reps with cues for forward weight shift.   STS and ambulation actross room followed by stand to sit timed with challenge to negative split each with good form.  Started with 39 sec and progressed to 21 sec. Negative split each  Single leg stance training 1LE on airex another on step 3 x 45 sec ea LE   Seated on dynadisc  -x 20 rows GTB -x 20 HABD for postural strengthening   PATIENT EDUCATION: Education details: PT plan of care; purpose of functional outcomes Person educated: Patient and Spouse Education method: Explanation and Verbal cues Education comprehension: verbalized understanding   HOME EXERCISE PROGRAM: Access Code: [email or web address] URL: [email or web address] Date: 04/20/2022 Prepared by: Rivka Barbara  Exercises - Sit to stand, walk with assistive device to another chair, sit and repeat.   - 1 x daily - 7 x weekly - 2 sets - 10 reps - Standing March with Counter Support  - 1 x daily - 7 x weekly - 3 sets - 10 reps - Standing in "bowling stance" with counter support   - 1 x daily - 7 x weekly - 3 sets - 10 reps - Heel Raises with Counter Support  - 1 x daily - 7 x weekly - 3 sets - 10 reps - Seated shoulder row while sitting on stability disc   - 1 x daily - 7 x weekly - 3 sets - 10 reps    GOALS: Goals reviewed with patient? Yes  SHORT  TERM GOALS: Target date: 01/11/2022  Pt will be independent with HEP in order to improve strength and balance in order to decrease fall risk and improve function at home and work.   Baseline: Patient reports not performing much in the way of any HEP except walking. 12/27: Having phases of participation and phases of refusal to participate.  Goal status: IN PROGRESS  LONG TERM GOALS: Target date: 02/22/2022  Pt will decrease 5TSTS by at least 5 seconds in order to demonstrate clinically significant improvement in LE strength. Baseline: 02/28/2022: 19.5 sec with UE support  04/13/22:11.4 sec  Goal status: IN PROGRESS- continue to monitor for consistent progression   2.  Pt will improve FOTO to target score of 48 to display perceived improvements in ability to complete ADL's.  Baseline: 02/28/22=40 12/27: 45 Goal status: IN PROGRESS  3.  Pt will decrease TUG to < or = to 25 seconds/decrease in order to demonstrate decreased fall risk. Baseline: 02/28/22= 37.26 sec using upright 4WW 12/27:20.21 sec  Goal status: IN PROGRESS Keep assessing to ensure progress is maintained   4.  Pt will increase 10MWT by at least 0.13 m/s in order to demonstrate clinically significant improvement in community ambulation.   Baseline: 02/28/22= 0.67 m/s using upright 4WW 12/27: .89 m/s  Goal status: MET  5.  Patient will increase six minute walk test distance to >1000 for progression to community ambulator and improve gait ability  Baseline: 02/28/22: 720 feet with up walker 04/13/22: 815 feet with up walker  Goal status: IN PROGRESS  6.  Patient will experience 1 fall or less per 2 week period for at least 3 weeks in order to improve his frequency of falls and prevent injury from falls  Baseline:04/13/22: Pt has had 1 fall in the last 2 weeks to his knowledge Goal status: INITIAL  ASSESSMENT:  CLINICAL IMPRESSION:  Patient presents with good motivation for completion of physical therapy activities.  Pt reports no falls and caretaker reports ipmroved copmliance with walker use since last visit. Pt challenged with endrance and funciotnal transfer training this date with good results.  Pt will continue to benefit from skilled physical therapy intervention to address impairments, improve QOL, and attain therapy goals.      OBJECTIVE IMPAIRMENTS Abnormal gait, decreased activity tolerance, decreased balance, decreased cognition, decreased coordination, decreased endurance, decreased mobility, difficulty walking, decreased ROM, decreased strength, decreased safety awareness, hypomobility, impaired perceived functional ability, and pain.   ACTIVITY LIMITATIONS carrying, lifting, bending, standing, squatting, stairs, transfers, bed mobility, bathing, toileting, dressing, self feeding, hygiene/grooming, and caring for others  PARTICIPATION LIMITATIONS: meal prep, cleaning, laundry, medication management, personal finances, driving, shopping, community activity, and yard work  PERSONAL FACTORS Time since onset of injury/illness/exacerbation are also affecting patient's functional outcome.   REHAB POTENTIAL: Fair    CLINICAL DECISION MAKING: Evolving/moderate complexity  EVALUATION COMPLEXITY: Moderate  PLAN: PT FREQUENCY: 2x/week  PT DURATION: 12 weeks  PLANNED INTERVENTIONS: Therapeutic exercises, Therapeutic activity, Neuromuscular re-education, Balance training, Gait training, Patient/Family education, Self Care, Joint mobilization, Joint manipulation, Stair training, Vestibular training, Canalith repositioning, DME instructions, Dry Needling, Electrical stimulation, Spinal manipulation, Spinal mobilization, Cryotherapy, Moist heat, Manual therapy, and Re-evaluation  PLAN FOR NEXT SESSION: Continue POC, work on speed up when doing blaze obstacle courses.   Particia Lather, PT 05/16/2022, 5:19 PM

## 2022-05-16 ENCOUNTER — Ambulatory Visit: Payer: Medicare PPO

## 2022-05-16 ENCOUNTER — Ambulatory Visit: Payer: Medicare PPO | Admitting: Physical Therapy

## 2022-05-16 ENCOUNTER — Encounter: Payer: Self-pay | Admitting: Physical Therapy

## 2022-05-16 DIAGNOSIS — R262 Difficulty in walking, not elsewhere classified: Secondary | ICD-10-CM

## 2022-05-16 DIAGNOSIS — R269 Unspecified abnormalities of gait and mobility: Secondary | ICD-10-CM

## 2022-05-16 DIAGNOSIS — R296 Repeated falls: Secondary | ICD-10-CM | POA: Diagnosis not present

## 2022-05-16 DIAGNOSIS — M6281 Muscle weakness (generalized): Secondary | ICD-10-CM | POA: Diagnosis not present

## 2022-05-16 DIAGNOSIS — R2681 Unsteadiness on feet: Secondary | ICD-10-CM | POA: Diagnosis not present

## 2022-05-16 DIAGNOSIS — G20A1 Parkinson's disease without dyskinesia, without mention of fluctuations: Secondary | ICD-10-CM

## 2022-05-16 DIAGNOSIS — R278 Other lack of coordination: Secondary | ICD-10-CM

## 2022-05-16 NOTE — Therapy (Signed)
OUTPATIENT OCCUPATIONAL THERAPY NEURO TREATMENT NOTE  Patient Name: [name] MRN: [id number] DOB:02-22-1951, 72 y.o., male Today's Date: 05/16/2022  PCP: Dr. Elsie Stain REFERRING PROVIDER: Dr. Elsie Stain   OT End of Session - 05/16/22 1102     Visit Number 17    Number of Visits 24    Date for OT Re-Evaluation 05/23/22    Authorization Time Period Reporting period beginning 04/13/22    OT Start Time 22    OT Stop Time 1145    OT Time Calculation (min) 45 min    Equipment Utilized During Treatment rollator    Activity Tolerance Patient tolerated treatment well    Behavior During Therapy [facility] for tasks assessed/performed                Past Medical History:  Diagnosis Date   Arthritis    Bradycardia    Cancer ([place]) 2013   skin cancer   Depression    ptsd   Dysrhythmia    chronic slow heart rate   GERD (gastroesophageal reflux disease)    Headache(784.0)    tension headaches non recent   History of chicken pox    History of kidney stones    passed   Hypertension    treated with HCTZ   Pacemaker    Parkinson's disease    dx'ed 15 years ago   PTSD (post-traumatic stress disorder)    Shortness of breath dyspnea    Sleep apnea    doesn't use C-pap   Varicose veins    Past Surgical History:  Procedure Laterality Date   CHOLECYSTECTOMY N/A 10/22/2014   Procedure: LAPAROSCOPIC CHOLECYSTECTOMY WITH INTRAOPERATIVE CHOLANGIOGRAM;  Surgeon: Dia Crawford III, MD;  Location: [facility] ORS;  Service: General;  Laterality: N/A;   cyst removed      from lip as a child   INTRAMEDULLARY (IM) NAIL INTERTROCHANTERIC N/A 02/18/2019   Procedure: INTRAMEDULLARY (IM) NAIL INTERTROCHANTRIC, RIGHT,;  Surgeon: Thornton Park, MD;  Location: [facility] ORS;  Service: Orthopedics;  Laterality: N/A;   LUMBAR LAMINECTOMY/DECOMPRESSION MICRODISCECTOMY Bilateral 12/14/2012   Procedure: Bilateral lumbar three-four, four-five decompressive laminotomy/foraminotomy;  Surgeon: Charlie Pitter, MD;   Location: [place] NEURO ORS;  Service: Neurosurgery;  Laterality: Bilateral;   PULSE GENERATOR IMPLANT Bilateral 12/13/2013   Procedure: Bilateral implantable pulse generator placement;  Surgeon: Erline Levine, MD;  Location: [place] NEURO OR[facility];  Service: Neurosurgery;  Laterality: Bilateral;  Bilateral implantable pulse generator placement   skin cancer removed     from ears,   12 lft arm  rt leg 15   SUBTHALAMIC STIMULATOR BATTERY REPLACEMENT Bilateral 07/14/2017   Procedure: BILATERAL IMPLANTED PULSE GENERATOR CHANGE FOR DEEP BRAIN STIMULATOR;  Surgeon: Erline Levine, MD;  Location: [place];  Service: Neurosurgery;  Laterality: Bilateral;   SUBTHALAMIC STIMULATOR INSERTION Bilateral 12/06/2013   Procedure: SUBTHALAMIC STIMULATOR INSERTION;  Surgeon: Erline Levine, MD;  Location: [place] [facility]URO ORS;  Service: Neurosurgery;  Laterality: Bilateral;  Bilateral deep brain stimulator placement   Patient Active Problem List   Diagnosis Date Noted   Hematuria 10/10/2021   Sinus drainage 12/09/2020   Medicare annual wellness visit, subsequent 11/18/2020   Pacemaker    Dementia associated with Parkinson's disease ([place]) 06/17/2020   Vertigo 02/19/2020   Urine abnormality 02/19/2020   Dysphagia 02/23/2019   Fall at home 10/18/2017   REM behavioral disorder 11/02/2016   Radicular pain in right arm 10/09/2016   GERD (gastroesophageal reflux disease) 07/31/2016   Colon cancer screening 07/23/2015   Gout 02/18/2015  Depression 01/06/2015   Skin lesion 10/02/2014   Dupuytren's contracture 08/20/2014   Cough 07/21/2014   Lumbar stenosis with neurogenic claudication 06/13/2014   S/P deep brain stimulator placement 05/08/2014   Advance care planning 01/19/2014   Parkinson's disease 12/13/2013   PTSD (post-traumatic stress disorder) 06/13/2013   Erectile dysfunction 06/13/2013   HLD (hyperlipidemia) 06/13/2013   Essential hypertension 06/03/2013   Bradycardia by electrocardiogram 06/03/2013   Obstructive sleep apnea  03/12/2013    ONSET DATE: 2015  REFERRING DIAG: Parkinson's Disease  THERAPY DIAG:  Muscle weakness (generalized)  Other lack of coordination  Parkinson's disease without dyskinesia or fluctuating manifestations  Rationale for Evaluation and Treatment: Rehabilitation  SUBJECTIVE: Pt/caregiver both report pt doing much better with OT goals.  OT discussed plan to discharge at end of cert (next week) and both in agreement with plan.  Encouraged pt/caregiver to discuss plans with spouse this week and spouse to reach out with any questions or concerns.  Both verbalized understanding.  SUBJECTIVE STATEMENT: Pt accompanied by:  caregiver [name])  PERTINENT HISTORY: Parkinson's Disease, dementia, DBS, multiple falls  PRECAUTIONS: Fall  WEIGHT BEARING RESTRICTIONS: No  PAIN:  Are you having pain? Yes: NPRS scale: 4/10 Pain location: hips and low back, R knee Pain description: sharp, tight Aggravating factors: if pt crosses feet too long/awkward positioning Relieving factors: voltaren gel   FALLS: Has patient fallen in last 6 months? Yes. Number of falls : spouse estimates 20  LIVING ENVIRONMENT: Lives with: lives with their spouse Lives in: house, 1 level Stairs: No Has following equipment at home: [place] entry, handicapped accessible bathroom  PLOF: Needs assistance with ADLs  PATIENT GOALS: increase bilat hand coordination for ADLs  OBJECTIVE:   HAND DOMINANCE: Right  ADLs: Overall ADLs: caregiver assist for all ADLs; pt currently as 16 hrs of caregiver assist per week through the [place].  Pt recently was assigned 100% disability by the [place], and spouse reports caregiver hours will increase to 46 per week, but uncertain when Transfers/ambulation related to ADLs: supv; pt uses upright 4 wheeled walker Eating: caregivers help to cut food Grooming: mod A to shave with an electric razor UB Dressing: min A, difficulty with buttons LB Dressing: mod A, difficulty with zippers and  buttons Toileting: occasional accidents with bowel and bladder, but less than weekly and only during the night; distant supv for toileting during the day Bathing: min A for bathing, mod A for drying off Tub Shower transfers: min guard for shower transfers with grab bar and shower chair Equipment:  fully handicapped shower and toilet  IADLs: Shopping: dep on caregiver Light housekeeping: dep Meal Prep: dep Community mobility: short distance community amb with supv and 4 wheeled walker Medication management: dep Financial management: dep Handwriting:  printed first/last name 50% legible, cursive first/last name illegible  MOBILITY STATUS: Hx of falls  POSTURE COMMENTS:  rounded shoulders, forward head, increased thoracic kyphosis, and flexed trunk   FUNCTIONAL OUTCOME MEASURES: FOTO: 40.2 (completed with PT); predicted 48  UPPER EXTREMITY ROM:   Pt demos BUE shoulder ROM WFL for self care  UPPER EXTREMITY MMT:     MMT Right eval Left eval  Shoulder flexion 5 5  Shoulder abduction 5 5  Shoulder adduction    Shoulder extension    Shoulder internal rotation    Shoulder external rotation    Middle trapezius    Lower trapezius    Elbow flexion 5 5  Elbow extension 5 5  Wrist flexion 5 5  Wrist extension 5 5  Wrist ulnar deviation    Wrist radial deviation    Wrist pronation    Wrist supination    (Blank rows = not tested)  HAND FUNCTION: Grip strength: Right: 61 lbs; Left: 61 lbs, Lateral pinch: Right: 20 lbs, Left: 19 lbs, and 3 point pinch: Right: 11 lbs, Left: 15 lbs 04/13/22: Grip strength: Right: 66 lbs; Left: 65 lbs, Lateral pinch: Right: 18 lbs, Left: 18 lbs, and 3 point pinch: Right: 14 lbs, Left: 15 lbs  COORDINATION: Finger Nose Finger test: increased time, L side touched lower than tip, corrected with min vc 9 Hole Peg test: Right: 1 min 18 sec; Left: 41 sec 04/13/22: 9 Hole Peg test: Right: 1 min 4 sec; Left: 37 sec   SENSATION: Proprioception: Impaired    EDEMA: none  COGNITION: Overall cognitive status: History of cognitive impairments - at baseline; Parkinson's with dementia  VISION: Wears prism glasses; hx of visual hallucinations  PRAXIS: Impaired: Motor planning; bradykinesia, R>L  TODAY'S TREATMENT:                                                                                                                               Therapeutic Exercise: Facilitated pinch strengthening with use of therapy resistant clothespins to target lateral and 3 point pinch of R/L hands.  Performed 1 trial each hand for each color and pinch type, then completed another trial of same in standing to challenge dual tasking abilities.  Pt able to perform with min guard and wide BOS.    Self Care: Practiced buttoning/unbuttoning large buttons and tying large laces in standing to challenge dual tasking.  Pt able to perform with min guard, extra time, and wide BOS.  Neuro re-ed: Facilitated FMC/dexterity skills in R/L hands, working to pick up 1 washer at a time, store 3 in hand, then discard onto vertical dowel.  Practiced washer pick up from magnetic resistive dish and then from wooden surface without sliding to edge of surface.     PATIENT EDUCATION: Education details: HEP progression Person educated: caregiver, [name] Education method: Explanation, written handout Education comprehension: verbalized understanding  HOME EXERCISE PROGRAM: To be established in the next 1-2 sessions  GOALS: Goals reviewed with patient? Yes  SHORT TERM GOALS: Target date: 04/11/22  1. Pt will complete HEP for increasing [place] and strength in bilat hands with direct supv at least 4 of 7 days per week. Baseline: not yet initiated; 04/03/22: caregiver reports pt has putty but rarely uses Goal status: ongoing  2. Pt will clean his glasses with min A and mod vc, including opening cleaning packet.   Baseline: max A; 04/13/22: able to clean with set up/supv with min vc.    Goal status: achieved   LONG TERM GOALS: Target date: 05/23/22  Pt will increase FOTO score by 5 or more points to indicate increase in self/caregiver perceived functional performance with daily tasks.  Baseline: 40.2 (predicted 48);  Goal status: INITIAL  2.  Pt will increase R/L lateral pinch strength by 4 or more lbs to enable pt to independently turn on electric toothbrush and twist open toothpaste. Baseline: R 20, L 19; caregiver assist to turn on toothbrush and open toothpaste; 04/13/22: B 18 lbs, pt still struggles with above activities Goal status: INITIAL  3.  Pt will increase [place] in bilat hands to manage buttons and zippers on clothing 75% of the time.  Baseline: caregivers assist with clothing fasteners at least 50% of the time; 04/13/22: caregiver reports pt requires assist to set up zipper on coat, and pt can manage buttons on jeans and zipper, occasionally having to lie flat on back to manage fasteners.  Pt can manage buttons with extra time on shirt (9 hole: R 1 min 4 sec, L 37 sec) Goal status: ongoing  4.  Pt will scoop food from plate/bowl with min vc using adapted utensils and adapted plates/bowls as needed.  Baseline: caregiver constantly turns plate to compensate for pt pushing food to 1 side; 04/13/22: pt now uses divided plate which allows pt to scoop his food without having spouse turn his plate or bowl.  Pt continues to use built up eating utensils. Goal status: achieved  5.  Pt will shave with electric razor with min A for thoroughness. Baseline: pt struggles to grade sufficient pressure to thoroughly shave face, requiring mod A; 04/13/22: Spouse is waiting for a nicer razor from the [place], so last few times barber will shave him when he's getting a hair cut.   Goal status: ongoing  ASSESSMENT:  CLINICAL IMPRESSION: Goals reviewed with pt and caregiver as pt is approaching end of cert.  Pt/caregiver both report pt doing much better with OT goals.  OT discussed plan  to discharge at end of cert (next week) and both in agreement with plan.  Encouraged pt/caregiver to discuss plans with spouse this week and spouse to reach out with any questions or concerns.  Both verbalized understanding.  Pt practiced dual tasking while standing to complete hand strengthening and clothing fasteners.  Pt was able to maintain balance today with min guard and wide BOS.  FM tasks require extra time in standing, but no R lateral leaning noted today with these tasks.  During completion of seated exercises, pt practiced small item pick up, storage, and translatory skills using washers.  Pt did well to pick up washers from magnetic dish and wooden surface, but did have some dropped washers when moving washers between palm and fingertips in either hand.  Pt. continues to benefit from OT services to maximize hand strength and coordination for daily tasks, and provide strategies to maximize indep with ADLs and leisure activities to increase QOL.  PERFORMANCE DEFICITS: in functional skills including ADLs, IADLs, coordination, dexterity, proprioception, strength, pain, Fine motor control, Gross motor control, mobility, balance, body mechanics, endurance, decreased knowledge of use of DME, and UE functional use, cognitive skills including attention, learn, memory, orientation, problem solving, safety awareness, thought, and understand. IMPAIRMENTS: are limiting patient from ADLs, IADLs, rest and sleep, leisure, and social participation.   CO-MORBIDITIES: has co-morbidities such as dementia  that affects occupational performance. Patient will benefit from skilled OT to address above impairments and improve overall function.  MODIFICATION OR ASSISTANCE TO COMPLETE EVALUATION: Min-Moderate modification of tasks or assist with assess necessary to complete an evaluation.  OT OCCUPATIONAL PROFILE AND HISTORY: Problem focused assessment: Including review of records relating to presenting  problem.  CLINICAL DECISION  MAKING: Moderate - several treatment options, min-mod task modification necessary  REHAB POTENTIAL: Good  EVALUATION COMPLEXITY: Moderate    PLAN:  OT FREQUENCY: 2x/week  OT DURATION: 12 weeks  PLANNED INTERVENTIONS: self care/ADL training, therapeutic exercise, therapeutic activity, neuromuscular re-education, manual therapy, balance training, functional mobility training, moist heat, cryotherapy, patient/family education, cognitive remediation/compensation, energy conservation, coping strategies training, and DME and/or AE instructions  RECOMMENDED OTHER SERVICES: N/A  CONSULTED AND AGREED WITH PLAN OF CARE: Patient and family member/caregiver  PLAN FOR NEXT SESSION: see eval  Leta Speller, MS, OTR/L  Darleene Cleaver, OT 05/16/2022, 12:38 PM

## 2022-05-17 NOTE — Therapy (Unsigned)
OUTPATIENT PHYSICAL THERAPY NEURO Treatment     Patient Name: [name] MRN: [id number] DOB:1950/06/12, 72 y.o., male Today's Date: 05/18/2022   PCP: Dr. Elsie Stain REFERRING PROVIDER: Dr. Sharolyn Douglas   PT End of Session - 05/18/22 1055     Visit Number 17    Number of Visits 24    Date for PT Re-Evaluation 05/23/22    Authorization Type [employer] Choice PPO    Authorization Time Period Cert through 05/24/69    Progress Note Due on Visit 20    PT Start Time 1146    PT Stop Time 1227    PT Time Calculation (min) 41 min    Equipment Utilized During Treatment Gait belt    Activity Tolerance Patient tolerated treatment well    Behavior During Therapy WFL for tasks assessed/performed                         Past Medical History:  Diagnosis Date   Arthritis    Bradycardia    Cancer ([place]) 2013   skin cancer   Depression    ptsd   Dysrhythmia    chronic slow heart rate   GERD (gastroesophageal reflux disease)    Headache(784.0)    tension headaches non recent   History of chicken pox    History of kidney stones    passed   Hypertension    treated with HCTZ   Pacemaker    Parkinson's disease    dx'ed 15 years ago   PTSD (post-traumatic stress disorder)    Shortness of breath dyspnea    Sleep apnea    doesn't use C-pap   Varicose veins    Past Surgical History:  Procedure Laterality Date   CHOLECYSTECTOMY N/A 10/22/2014   Procedure: LAPAROSCOPIC CHOLECYSTECTOMY WITH INTRAOPERATIVE CHOLANGIOGRAM;  Surgeon: Dia Crawford III, MD;  Location: [facility] ORS;  Service: General;  Laterality: N/A;   cyst removed      from lip as a child   INTRAMEDULLARY (IM) NAIL INTERTROCHANTERIC N/A 02/18/2019   Procedure: INTRAMEDULLARY (IM) NAIL INTERTROCHANTRIC, RIGHT,;  Surgeon: Thornton Park, MD;  Location: [facility];  Service: Orthopedics;  Laterality: N/A;   LUMBAR LAMINECTOMY/DECOMPRESSION MICRODISCECTOMY Bilateral 12/14/2012   Procedure: Bilateral lumbar  three-four, four-five decompressive laminotomy/foraminotomy;  Surgeon: Charlie Pitter, MD;  Location: [place] [facility];  Service: Neurosurgery;  Laterality: Bilateral;   PULSE GENERATOR IMPLANT Bilateral 12/13/2013   Procedure: Bilateral implantable pulse generator placement;  Surgeon: Erline Levine, MD;  Location: [place] [facility];  Service: Neurosurgery;  Laterality: Bilateral;  Bilateral implantable pulse generator placement   skin cancer removed     from ears,   12 lft arm  rt leg 15   SUBTHALAMIC STIMULATOR BATTERY REPLACEMENT Bilateral 07/14/2017   Procedure: BILATERAL IMPLANTED PULSE GENERATOR CHANGE FOR DEEP BRAIN STIMULATOR;  Surgeon: Erline Levine, MD;  Location: [place];  Service: Neurosurgery;  Laterality: Bilateral;   SUBTHALAMIC STIMULATOR INSERTION Bilateral 12/06/2013   Procedure: SUBTHALAMIC STIMULATOR INSERTION;  Surgeon: Erline Levine, MD;  Location: [place] [facility];  Service: Neurosurgery;  Laterality: Bilateral;  Bilateral deep brain stimulator placement   Patient Active Problem List   Diagnosis Date Noted   Hematuria 10/10/2021   Sinus drainage 12/09/2020   Medicare annual wellness visit, subsequent 11/18/2020   Pacemaker    Dementia associated with Parkinson's disease ([place]) 06/17/2020   Vertigo 02/19/2020   Urine abnormality 02/19/2020   Dysphagia 02/23/2019   Fall at home 10/18/2017  REM behavioral disorder 11/02/2016   Radicular pain in right arm 10/09/2016   GERD (gastroesophageal reflux disease) 07/31/2016   Colon cancer screening 07/23/2015   Gout 02/18/2015   Depression 01/06/2015   Skin lesion 10/02/2014   Dupuytren's contracture 08/20/2014   Cough 07/21/2014   Lumbar stenosis with neurogenic claudication 06/13/2014   S/P deep brain stimulator placement 05/08/2014   Advance care planning 01/19/2014   Parkinson's disease 12/13/2013   PTSD (post-traumatic stress disorder) 06/13/2013   Erectile dysfunction 06/13/2013   HLD (hyperlipidemia) 06/13/2013   Essential  hypertension 06/03/2013   Bradycardia by electrocardiogram 06/03/2013   Obstructive sleep apnea 03/12/2013    ONSET DATE: 15 years ago per chart  REFERRING DIAG: Parkinson's disease  THERAPY DIAG:  Unsteadiness on feet  Repeated falls  Abnormality of gait and mobility  Difficulty in walking, not elsewhere classified  Rationale for Evaluation and Treatment Rehabilitation  SUBJECTIVE:                                                                                                                                                                                              SUBJECTIVE STATEMENT: Pt reports having a fall since last session. He reports he fell when leaning on a bookcase when he awoke in the middle of the night.  Had a small scratch on his back but no other issues noted outside of of some shoulder soreness.   Pt accompanied by: [name] (caretaker)  PERTINENT HISTORY:   Date of most recent Admission: 09/25/2021 Date of Discharge: 09/28/2021  Discharge Diagnoses:  Altered mental status Parkinson disease (CMS/HCC) Hallucinations  Per [facility] discharge summary notes PHI : Hospital Course:   72 y.o. male with a history of Parkinson's Disease w/ DBS in place, PD-related dementia, OSA, depression, and seizure disorder who presented for EEG given concern for seizures.  Altered mental status Concern seizure activity Periods of unresponsiveness on the setting of new medication--Nuplazid (pimavanserin) recently started. No events of note on continuous EEG. This was held and he returned to his baseline mental status. Other workup including CT head, CTA h/n without obvious cause for periodic unresponsiveness. Nuplazid was discontinued.   Aspiration, weakness Of note, he had a coughing spell while eating though sounds like was precipitated by sneezing. Did well on RA, seen by SLP without any further needs or concerns.  Parkinson's Disease Parkinson's-related dementia - He  follows in the Movement Disorders center at [facility] Clinic notes suggest he seems to have worsening of his overall disease trajectory. - Continued home Memantine and Sinemet   PMH: HTN, bradycardia s/p PPM, PD, DBS, COVID-19 infection, chronic low  back pain s/p L4/5 surgery.  PAIN:  Are you having pain? Yes: NPRS scale: 7/10 Pain location: Low back Pain description: ache Aggravating factors: Prolonged standing/sitting, bending over Relieving factors: rest, changing positions  PRECAUTIONS: Fall  WEIGHT BEARING RESTRICTIONS No  FALLS: Has patient fallen in last 6 months? Yes. Number of falls >10  LIVING ENVIRONMENT: Lives with: lives with their spouse Lives in: House/apartment Stairs:  Ramp Has following equipment at home: Single point cane, Environmental consultant - 4 wheeled, Wheelchair (manual), shower chair, bed side commode, Grab bars, and Ramped entry  PLOF: Needs assistance with ADLs, Needs assistance with homemaking, Needs assistance with gait, and Needs assistance with transfers  PATIENT GOALS To walk better and not fall. Improve fall frequency to prevent significant injury   OBJECTIVE: (objective measures completed at initial evaluation unless otherwise dated)   DIAGNOSTIC FINDINGS:   COGNITION: Overall cognitive status: History of cognitive impairments - at baseline   SENSATION: Light touch: WFL  COORDINATION: Decreased and slow to initiate  EDEMA:  None observed     POSTURE: rounded shoulders, forward head, increased thoracic kyphosis, and flexed trunk      TRANSFERS: Assistive device utilized:  Upright 4WW   Sit to stand:  Min/mod assist- with increased VC for safety Stand to sit:  Min assist with Verbal cues to lock brakes and reach back Chair to chair: Min A     GAIT: Gait pattern: decreased arm swing- Right, decreased arm swing- Left, decreased step length- Right, decreased step length- Left, decreased stance time- Right, decreased stance time- Left,  decreased stride length, and shuffling Distance walked: 150 ft. Assistive device utilized:  upright 4WW Level of assistance: Min A Comments: Shuffling gait with slow gait speed  FUNCTIONAL TESTs:  5 times sit to stand: 19.5 sec with UE support with BUE support Timed up and go (TUG): 37.26 sec with upright 4WW 6 minute walk test: 720 feet using upright 4WW 10 meter walk test: 0.67 m/s  PATIENT SURVEYS:  FOTO 50  TODAY'S TREATMENT:  05/18/22  TE  Ambulation long loop through EVS hallway with 3# AW donned (approx 450 feet)  STS x 10 reps with cues for forward weight shift.   NMR  PWR! Up x 10 reps with cues for UE movement and weight shifting   Single leg stance training 1LE on airex another on step 3 x 45 sec ea LE   PWR! Step 2 x 10 ea direction, no UE assist. Cues for large amplitude step out.   Seated on dynadisc  -x 20 rows with GTB -x 20 HABD for postural strengthening with GTB   Basketball shot with mini squat to retrieve ball each rep x 20 reps with basket of balls located anterior to challenge anterior weight shifting with squatting, good balance but cues for good squat form and weigh shifting.   Note: Portions of this document were prepared using Dragon voice recognition software and although reviewed may contain unintentional dictation errors in syntax, grammar, or spelling.   PATIENT EDUCATION: Education details: PT plan of care; purpose of functional outcomes Person educated: Patient and Spouse Education method: Explanation and Verbal cues Education comprehension: verbalized understanding   HOME EXERCISE PROGRAM: Access Code: [email or web address] URL: [email or web address] Date: 04/20/2022 Prepared by: Rivka Barbara  Exercises - Sit to stand, walk with assistive device to another chair, sit and repeat.   - 1 x daily - 7 x weekly - 2 sets - 10 reps - Standing March with Counter Support  - 1  x daily - 7 x weekly - 3 sets - 10 reps - Standing in  "bowling stance" with counter support   - 1 x daily - 7 x weekly - 3 sets - 10 reps - Heel Raises with Counter Support  - 1 x daily - 7 x weekly - 3 sets - 10 reps - Seated shoulder row while sitting on stability disc   - 1 x daily - 7 x weekly - 3 sets - 10 reps    GOALS: Goals reviewed with patient? Yes  SHORT TERM GOALS: Target date: 01/11/2022  Pt will be independent with HEP in order to improve strength and balance in order to decrease fall risk and improve function at home and work.  Baseline: Patient reports not performing much in the way of any HEP except walking. 12/27: Having phases of participation and phases of refusal to participate.  Goal status: IN PROGRESS  LONG TERM GOALS: Target date: 02/22/2022  Pt will decrease 5TSTS by at least 5 seconds in order to demonstrate clinically significant improvement in LE strength. Baseline: 02/28/2022: 19.5 sec with UE support  04/13/22:11.4 sec  Goal status: IN PROGRESS- continue to monitor for consistent progression   2.  Pt will improve FOTO to target score of 48 to display perceived improvements in ability to complete ADL's.  Baseline: 02/28/22=40 12/27: 45 Goal status: IN PROGRESS  3.  Pt will decrease TUG to < or = to 25 seconds/decrease in order to demonstrate decreased fall risk. Baseline: 02/28/22= 37.26 sec using upright 4WW 12/27:20.21 sec  Goal status: IN PROGRESS Keep assessing to ensure progress is maintained   4.  Pt will increase 10MWT by at least 0.13 m/s in order to demonstrate clinically significant improvement in community ambulation.   Baseline: 02/28/22= 0.67 m/s using upright 4WW 12/27: .89 m/s  Goal status: MET  5.  Patient will increase six minute walk test distance to >1000 for progression to community ambulator and improve gait ability  Baseline: 02/28/22: 720 feet with up walker 04/13/22: 815 feet with up walker  Goal status: IN PROGRESS  6.  Patient will experience 1 fall or less per 2 week period for  at least 3 weeks in order to improve his frequency of falls and prevent injury from falls  Baseline:04/13/22: Pt has had 1 fall in the last 2 weeks to his knowledge Goal status: INITIAL  ASSESSMENT:  CLINICAL IMPRESSION:  Patient presents with good motivation for completion of physical therapy activities.  Patient continued to have difficulty with fall secondary to getting up in the middle the night not utilizing appropriate assistive device for safe means of ambulation.  Patient also has difficulty with this secondary to dementia and impulsivity which has been hard to train although adequate attempts have been made in clinic.  Did not work as much on [employer] intervention of completing various tasks and utilizing walker in between steps secondary to patient not bringing his walker to therapy today.  Pt will continue to benefit from skilled physical therapy intervention to address impairments, improve QOL, and attain therapy goals.      OBJECTIVE IMPAIRMENTS Abnormal gait, decreased activity tolerance, decreased balance, decreased cognition, decreased coordination, decreased endurance, decreased mobility, difficulty walking, decreased ROM, decreased strength, decreased safety awareness, hypomobility, impaired perceived functional ability, and pain.   ACTIVITY LIMITATIONS carrying, lifting, bending, standing, squatting, stairs, transfers, bed mobility, bathing, toileting, dressing, self feeding, hygiene/grooming, and caring for others  PARTICIPATION LIMITATIONS: meal prep, cleaning, laundry, medication management, personal  finances, driving, shopping, community activity, and yard work  PERSONAL FACTORS Time since onset of injury/illness/exacerbation are also affecting patient's functional outcome.   REHAB POTENTIAL: Fair    CLINICAL DECISION MAKING: Evolving/moderate complexity  EVALUATION COMPLEXITY: Moderate  PLAN: PT FREQUENCY: 2x/week  PT DURATION: 12 weeks  PLANNED  INTERVENTIONS: Therapeutic exercises, Therapeutic activity, Neuromuscular re-education, Balance training, Gait training, Patient/Family education, Self Care, Joint mobilization, Joint manipulation, Stair training, Vestibular training, Canalith repositioning, DME instructions, Dry Needling, Electrical stimulation, Spinal manipulation, Spinal mobilization, Cryotherapy, Moist heat, Manual therapy, and Re-evaluation  PLAN FOR NEXT SESSION: Continue POC, work on speed up when doing blaze obstacle courses.   Particia Lather, PT 05/18/2022, 10:56 AM

## 2022-05-18 ENCOUNTER — Ambulatory Visit: Payer: Medicare PPO | Admitting: Physical Therapy

## 2022-05-18 ENCOUNTER — Ambulatory Visit: Payer: Medicare PPO

## 2022-05-18 ENCOUNTER — Encounter: Payer: Self-pay | Admitting: Physical Therapy

## 2022-05-18 DIAGNOSIS — R296 Repeated falls: Secondary | ICD-10-CM

## 2022-05-18 DIAGNOSIS — R278 Other lack of coordination: Secondary | ICD-10-CM | POA: Diagnosis not present

## 2022-05-18 DIAGNOSIS — M6281 Muscle weakness (generalized): Secondary | ICD-10-CM

## 2022-05-18 DIAGNOSIS — G20A1 Parkinson's disease without dyskinesia, without mention of fluctuations: Secondary | ICD-10-CM

## 2022-05-18 DIAGNOSIS — R2681 Unsteadiness on feet: Secondary | ICD-10-CM

## 2022-05-18 DIAGNOSIS — R269 Unspecified abnormalities of gait and mobility: Secondary | ICD-10-CM

## 2022-05-18 DIAGNOSIS — R262 Difficulty in walking, not elsewhere classified: Secondary | ICD-10-CM | POA: Diagnosis not present

## 2022-05-18 NOTE — Therapy (Signed)
OUTPATIENT OCCUPATIONAL THERAPY NEURO TREATMENT NOTE  Patient Name: [name] MRN: [id number] DOB:Jan 07, 1951, 72 y.o., male Today's Date: 05/18/2022  PCP: Dr. Elsie Stain REFERRING PROVIDER: Dr. Elsie Stain   OT End of Session - 05/18/22 1149     Visit Number 18    Number of Visits 24    Date for OT Re-Evaluation 05/23/22    Authorization Time Period Reporting period beginning 04/13/22    OT Start Time 15    OT Stop Time 1145    OT Time Calculation (min) 45 min    Equipment Utilized During Treatment rollator    Activity Tolerance Patient tolerated treatment well    Behavior During Therapy [facility] for tasks assessed/performed                Past Medical History:  Diagnosis Date   Arthritis    Bradycardia    Cancer ([place]) 2013   skin cancer   Depression    ptsd   Dysrhythmia    chronic slow heart rate   GERD (gastroesophageal reflux disease)    Headache(784.0)    tension headaches non recent   History of chicken pox    History of kidney stones    passed   Hypertension    treated with HCTZ   Pacemaker    Parkinson's disease    dx'ed 15 years ago   PTSD (post-traumatic stress disorder)    Shortness of breath dyspnea    Sleep apnea    doesn't use C-pap   Varicose veins    Past Surgical History:  Procedure Laterality Date   CHOLECYSTECTOMY N/A 10/22/2014   Procedure: LAPAROSCOPIC CHOLECYSTECTOMY WITH INTRAOPERATIVE CHOLANGIOGRAM;  Surgeon: Dia Crawford III, MD;  Location: [facility] ORS;  Service: General;  Laterality: N/A;   cyst removed      from lip as a child   INTRAMEDULLARY (IM) NAIL INTERTROCHANTERIC N/A 02/18/2019   Procedure: INTRAMEDULLARY (IM) NAIL INTERTROCHANTRIC, RIGHT,;  Surgeon: Thornton Park, MD;  Location: [facility] ORS;  Service: Orthopedics;  Laterality: N/A;   LUMBAR LAMINECTOMY/DECOMPRESSION MICRODISCECTOMY Bilateral 12/14/2012   Procedure: Bilateral lumbar three-four, four-five decompressive laminotomy/foraminotomy;  Surgeon: Charlie Pitter, MD;   Location: [place] NEURO ORS;  Service: Neurosurgery;  Laterality: Bilateral;   PULSE GENERATOR IMPLANT Bilateral 12/13/2013   Procedure: Bilateral implantable pulse generator placement;  Surgeon: Erline Levine, MD;  Location: [place] NEURO ORS;  Service: Neurosurgery;  Laterality: Bilateral;  Bilateral implantable pulse generator placement   skin cancer removed     from ears,   12 lft arm  rt leg 15   SUBTHALAMIC STIMULATOR BATTERY REPLACEMENT Bilateral 07/14/2017   Procedure: BILATERAL IMPLANTED PULSE GENERATOR CHANGE FOR DEEP BRAIN STIMULATOR;  Surgeon: Erline Levine, MD;  Location: [place];  Service: Neurosurgery;  Laterality: Bilateral;   SUBTHALAMIC STIMULATOR INSERTION Bilateral 12/06/2013   Procedure: SUBTHALAMIC STIMULATOR INSERTION;  Surgeon: Erline Levine, MD;  Location: [place] NEURO ORS;  Service: Neurosurgery;  Laterality: Bilateral;  Bilateral deep brain stimulator placement   Patient Active Problem List   Diagnosis Date Noted   Hematuria 10/10/2021   Sinus drainage 12/09/2020   Medicare annual wellness visit, subsequent 11/18/2020   Pacemaker    Dementia associated with Parkinson's disease ([place]) 06/17/2020   Vertigo 02/19/2020   Urine abnormality 02/19/2020   Dysphagia 02/23/2019   Fall at home 10/18/2017   REM behavioral disorder 11/02/2016   Radicular pain in right arm 10/09/2016   GERD (gastroesophageal reflux disease) 07/31/2016   Colon cancer screening 07/23/2015   Gout 02/18/2015  Depression 01/06/2015   Skin lesion 10/02/2014   Dupuytren's contracture 08/20/2014   Cough 07/21/2014   Lumbar stenosis with neurogenic claudication 06/13/2014   S/P deep brain stimulator placement 05/08/2014   Advance care planning 01/19/2014   Parkinson's disease 12/13/2013   PTSD (post-traumatic stress disorder) 06/13/2013   Erectile dysfunction 06/13/2013   HLD (hyperlipidemia) 06/13/2013   Essential hypertension 06/03/2013   Bradycardia by electrocardiogram 06/03/2013   Obstructive sleep apnea  03/12/2013    ONSET DATE: 2015  REFERRING DIAG: Parkinson's Disease  THERAPY DIAG:  Muscle weakness (generalized)  Parkinson's disease without dyskinesia or fluctuating manifestations  Other lack of coordination  Rationale for Evaluation and Treatment: Rehabilitation  SUBJECTIVE: Caregiver reported that she did discuss d/c plans with pt's spouse and spouse was agreeable to plan.    SUBJECTIVE STATEMENT: Pt accompanied by:  caregiver [name])  PERTINENT HISTORY: Parkinson's Disease, dementia, DBS, multiple falls  PRECAUTIONS: Fall  WEIGHT BEARING RESTRICTIONS: No  PAIN:  Are you having pain? Yes: NPRS scale: 4/10 Pain location: hips and low back, R knee Pain description: sharp, tight Aggravating factors: if pt crosses feet too long/awkward positioning Relieving factors: voltaren gel   FALLS: Has patient fallen in last 6 months? Yes. Number of falls : spouse estimates 20  LIVING ENVIRONMENT: Lives with: lives with their spouse Lives in: house, 1 level Stairs: No Has following equipment at home: [place] entry, handicapped accessible bathroom  PLOF: Needs assistance with ADLs  PATIENT GOALS: increase bilat hand coordination for ADLs  OBJECTIVE:   HAND DOMINANCE: Right  ADLs: Overall ADLs: caregiver assist for all ADLs; pt currently as 16 hrs of caregiver assist per week through the [place].  Pt recently was assigned 100% disability by the [place], and spouse reports caregiver hours will increase to 46 per week, but uncertain when Transfers/ambulation related to ADLs: supv; pt uses upright 4 wheeled walker Eating: caregivers help to cut food Grooming: mod A to shave with an electric razor UB Dressing: min A, difficulty with buttons LB Dressing: mod A, difficulty with zippers and buttons Toileting: occasional accidents with bowel and bladder, but less than weekly and only during the night; distant supv for toileting during the day Bathing: min A for bathing, mod A for drying  off Tub Shower transfers: min guard for shower transfers with grab bar and shower chair Equipment:  fully handicapped shower and toilet  IADLs: Shopping: dep on caregiver Light housekeeping: dep Meal Prep: dep Community mobility: short distance community amb with supv and 4 wheeled walker Medication management: dep Financial management: dep Handwriting:  printed first/last name 50% legible, cursive first/last name illegible  MOBILITY STATUS: Hx of falls  POSTURE COMMENTS:  rounded shoulders, forward head, increased thoracic kyphosis, and flexed trunk   FUNCTIONAL OUTCOME MEASURES: FOTO: 40.2 (completed with PT); predicted 48  UPPER EXTREMITY ROM:   Pt demos BUE shoulder ROM WFL for self care  UPPER EXTREMITY MMT:     MMT Right eval Left eval  Shoulder flexion 5 5  Shoulder abduction 5 5  Shoulder adduction    Shoulder extension    Shoulder internal rotation    Shoulder external rotation    Middle trapezius    Lower trapezius    Elbow flexion 5 5  Elbow extension 5 5  Wrist flexion 5 5  Wrist extension 5 5  Wrist ulnar deviation    Wrist radial deviation    Wrist pronation    Wrist supination    (Blank rows = not tested)  HAND FUNCTION: Grip strength: Right: 61 lbs; Left: 61 lbs, Lateral pinch: Right: 20 lbs, Left: 19 lbs, and 3 point pinch: Right: 11 lbs, Left: 15 lbs 04/13/22: Grip strength: Right: 66 lbs; Left: 65 lbs, Lateral pinch: Right: 18 lbs, Left: 18 lbs, and 3 point pinch: Right: 14 lbs, Left: 15 lbs  COORDINATION: Finger Nose Finger test: increased time, L side touched lower than tip, corrected with min vc 9 Hole Peg test: Right: 1 min 18 sec; Left: 41 sec 04/13/22: 9 Hole Peg test: Right: 1 min 4 sec; Left: 37 sec   SENSATION: Proprioception: Impaired   EDEMA: none  COGNITION: Overall cognitive status: History of cognitive impairments - at baseline; Parkinson's with dementia  VISION: Wears prism glasses; hx of visual  hallucinations  PRAXIS: Impaired: Motor planning; bradykinesia, R>L  TODAY'S TREATMENT:                                                                                                                              Therapeutic Activity: Facilitated FMC/dexterity skills with participation in dice and chip game "Right, Left, Center."  Pt was cued to alternate use of L and R hands intermittently throughout the game to roll/pick up/move dice and chips around the table.  Added cognitive component with pt participating in sequencing through the steps of game with 50% caregiver support.  PATIENT EDUCATION: Education details: FMC/dexterity skills Person educated: caregiver, [name] Education method: Explanation, demo Education comprehension: verbalized understanding/demonstrated understanding  HOME EXERCISE PROGRAM: Putty, table games, cards   GOALS: Goals reviewed with patient? Yes  SHORT TERM GOALS: Target date: 04/11/22  1. Pt will complete HEP for increasing [place] and strength in bilat hands with direct supv at least 4 of 7 days per week. Baseline: not yet initiated; 04/03/22: caregiver reports pt has putty but rarely uses Goal status: ongoing  2. Pt will clean his glasses with min A and mod vc, including opening cleaning packet.   Baseline: max A; 04/13/22: able to clean with set up/supv with min vc.   Goal status: achieved   LONG TERM GOALS: Target date: 05/23/22  Pt will increase FOTO score by 5 or more points to indicate increase in self/caregiver perceived functional performance with daily tasks.  Baseline: 40.2 (predicted 48);  Goal status: INITIAL  2.  Pt will increase R/L lateral pinch strength by 4 or more lbs to enable pt to independently turn on electric toothbrush and twist open toothpaste. Baseline: R 20, L 19; caregiver assist to turn on toothbrush and open toothpaste; 04/13/22: B 18 lbs, pt still struggles with above activities Goal status: INITIAL  3.  Pt will increase [place]  in bilat hands to manage buttons and zippers on clothing 75% of the time.  Baseline: caregivers assist with clothing fasteners at least 50% of the time; 04/13/22: caregiver reports pt requires assist to set up zipper on coat, and pt can manage buttons on jeans and zipper, occasionally having to lie  flat on back to manage fasteners.  Pt can manage buttons with extra time on shirt (9 hole: R 1 min 4 sec, L 37 sec) Goal status: ongoing  4.  Pt will scoop food from plate/bowl with min vc using adapted utensils and adapted plates/bowls as needed.  Baseline: caregiver constantly turns plate to compensate for pt pushing food to 1 side; 04/13/22: pt now uses divided plate which allows pt to scoop his food without having spouse turn his plate or bowl.  Pt continues to use built up eating utensils. Goal status: achieved  5.  Pt will shave with electric razor with min A for thoroughness. Baseline: pt struggles to grade sufficient pressure to thoroughly shave face, requiring mod A; 04/13/22: Spouse is waiting for a nicer razor from the [place], so last few times barber will shave him when he's getting a hair cut.   Goal status: ongoing  ASSESSMENT:  CLINICAL IMPRESSION: Facilitated FMC/dexterity skills with participation in dice and chip game "Right, Left, Center."  Pt was cued to alternate use of L and R hands intermittently throughout the game to roll/pick up/move dice and chips around the table; extra time needed to pick up flat chip from the table without sliding to edge.  Added cognitive component with pt participating in sequencing through the steps of game with 50% caregiver support.  Caregiver reported that she did discuss d/c plans with pt's spouse and spouse was agreeable to plan.  Will plan to d/c next session/end of cert.  PERFORMANCE DEFICITS: in functional skills including ADLs, IADLs, coordination, dexterity, proprioception, strength, pain, Fine motor control, Gross motor control, mobility, balance,  body mechanics, endurance, decreased knowledge of use of DME, and UE functional use, cognitive skills including attention, learn, memory, orientation, problem solving, safety awareness, thought, and understand. IMPAIRMENTS: are limiting patient from ADLs, IADLs, rest and sleep, leisure, and social participation.   CO-MORBIDITIES: has co-morbidities such as dementia  that affects occupational performance. Patient will benefit from skilled OT to address above impairments and improve overall function.  MODIFICATION OR ASSISTANCE TO COMPLETE EVALUATION: Min-Moderate modification of tasks or assist with assess necessary to complete an evaluation.  OT OCCUPATIONAL PROFILE AND HISTORY: Problem focused assessment: Including review of records relating to presenting problem.  CLINICAL DECISION MAKING: Moderate - several treatment options, min-mod task modification necessary  REHAB POTENTIAL: Good  EVALUATION COMPLEXITY: Moderate    PLAN:  OT FREQUENCY: 2x/week  OT DURATION: 12 weeks  PLANNED INTERVENTIONS: self care/ADL training, therapeutic exercise, therapeutic activity, neuromuscular re-education, manual therapy, balance training, functional mobility training, moist heat, cryotherapy, patient/family education, cognitive remediation/compensation, energy conservation, coping strategies training, and DME and/or AE instructions  RECOMMENDED OTHER SERVICES: N/A  CONSULTED AND AGREED WITH PLAN OF CARE: Patient and family member/caregiver  PLAN FOR NEXT SESSION: d/c  Leta Speller, MS, OTR/L  Darleene Cleaver, OT 05/18/2022, 11:51 AM

## 2022-05-23 ENCOUNTER — Ambulatory Visit: Payer: Medicare PPO | Attending: Family Medicine

## 2022-05-23 ENCOUNTER — Ambulatory Visit: Payer: Medicare PPO

## 2022-05-23 DIAGNOSIS — R296 Repeated falls: Secondary | ICD-10-CM | POA: Insufficient documentation

## 2022-05-23 DIAGNOSIS — R278 Other lack of coordination: Secondary | ICD-10-CM

## 2022-05-23 DIAGNOSIS — M6281 Muscle weakness (generalized): Secondary | ICD-10-CM

## 2022-05-23 DIAGNOSIS — R262 Difficulty in walking, not elsewhere classified: Secondary | ICD-10-CM | POA: Diagnosis not present

## 2022-05-23 DIAGNOSIS — R269 Unspecified abnormalities of gait and mobility: Secondary | ICD-10-CM | POA: Insufficient documentation

## 2022-05-23 DIAGNOSIS — I495 Sick sinus syndrome: Secondary | ICD-10-CM

## 2022-05-23 DIAGNOSIS — G20A1 Parkinson's disease without dyskinesia, without mention of fluctuations: Secondary | ICD-10-CM

## 2022-05-23 DIAGNOSIS — R2681 Unsteadiness on feet: Secondary | ICD-10-CM | POA: Insufficient documentation

## 2022-05-23 NOTE — Therapy (Signed)
OUTPATIENT PHYSICAL THERAPY NEURO Treatment     Patient Name: [name] MRN: [id number] DOB:[birth year], 72 y.o., male Today's Date: 05/23/2022   PCP: Dr. Elsie Mcgee REFERRING PROVIDER: Dr. Sharolyn Mcgee   PT End of Session - 05/23/22 1253     Visit Number 18    Number of Visits 24    Date for PT Re-Evaluation 05/23/22    Authorization Type [employer] Choice PPO    Authorization Time Period Cert through 05/23/98    Progress Note Due on Visit 20    PT Start Time 1145    PT Stop Time 1225    PT Time Calculation (min) 40 min    Equipment Utilized During Treatment Gait belt    Activity Tolerance Patient tolerated treatment well    Behavior During Therapy WFL for tasks assessed/performed                          Past Medical History:  Diagnosis Date   Arthritis    Bradycardia    Cancer ([place]) 2013   skin cancer   Depression    ptsd   Dysrhythmia    chronic slow heart rate   GERD (gastroesophageal reflux disease)    Headache(784.0)    tension headaches non recent   History of chicken pox    History of kidney stones    passed   Hypertension    treated with HCTZ   Pacemaker    Parkinson's disease    dx'ed 15 years ago   PTSD (post-traumatic stress disorder)    Shortness of breath dyspnea    Sleep apnea    doesn't use C-pap   Varicose veins    Past Surgical History:  Procedure Laterality Date   CHOLECYSTECTOMY N/A 10/22/2014   Procedure: LAPAROSCOPIC CHOLECYSTECTOMY WITH INTRAOPERATIVE CHOLANGIOGRAM;  Surgeon: George Crawford III, MD;  Location: [facility];  Service: General;  Laterality: N/A;   cyst removed      from lip as a child   INTRAMEDULLARY (IM) NAIL INTERTROCHANTERIC N/A 02/18/2019   Procedure: INTRAMEDULLARY (IM) NAIL INTERTROCHANTRIC, RIGHT,;  Surgeon: George Park, MD;  Location: [facility];  Service: Orthopedics;  Laterality: N/A;   LUMBAR LAMINECTOMY/DECOMPRESSION MICRODISCECTOMY Bilateral 12/14/2012   Procedure: Bilateral lumbar  three-four, four-five decompressive laminotomy/foraminotomy;  Surgeon: George Pitter, MD;  Location: [place] [facility];  Service: Neurosurgery;  Laterality: Bilateral;   PULSE GENERATOR IMPLANT Bilateral 12/13/2013   Procedure: Bilateral implantable pulse generator placement;  Surgeon: George Levine, MD;  Location: [place] [facility];  Service: Neurosurgery;  Laterality: Bilateral;  Bilateral implantable pulse generator placement   skin cancer removed     from ears,   12 lft arm  rt leg 15   SUBTHALAMIC STIMULATOR BATTERY REPLACEMENT Bilateral 07/14/2017   Procedure: BILATERAL IMPLANTED PULSE GENERATOR CHANGE FOR DEEP BRAIN STIMULATOR;  Surgeon: George Levine, MD;  Location: [place];  Service: Neurosurgery;  Laterality: Bilateral;   SUBTHALAMIC STIMULATOR INSERTION Bilateral 12/06/2013   Procedure: SUBTHALAMIC STIMULATOR INSERTION;  Surgeon: George Levine, MD;  Location: [place] [facility];  Service: Neurosurgery;  Laterality: Bilateral;  Bilateral deep brain stimulator placement   Patient Active Problem List   Diagnosis Date Noted   Hematuria 10/10/2021   Sinus drainage 12/09/2020   Medicare annual wellness visit, subsequent 11/18/2020   Pacemaker    Dementia associated with Parkinson's disease ([place]) 06/17/2020   Vertigo 02/19/2020   Urine abnormality 02/19/2020   Dysphagia 02/23/2019   Fall at home 10/18/2017  REM behavioral disorder 11/02/2016   Radicular pain in right arm 10/09/2016   GERD (gastroesophageal reflux disease) 07/31/2016   Colon cancer screening 07/23/2015   Gout 02/18/2015   Depression 01/06/2015   Skin lesion 10/02/2014   Dupuytren's contracture 08/20/2014   Cough 07/21/2014   Lumbar stenosis with neurogenic claudication 06/13/2014   S/P deep brain stimulator placement 05/08/2014   Advance care planning 01/19/2014   Parkinson's disease 12/13/2013   PTSD (post-traumatic stress disorder) 06/13/2013   Erectile dysfunction 06/13/2013   HLD (hyperlipidemia) 06/13/2013   Essential  hypertension 06/03/2013   Bradycardia by electrocardiogram 06/03/2013   Obstructive sleep apnea 03/12/2013    ONSET DATE: 15 years ago per chart  REFERRING DIAG: Parkinson's disease  THERAPY DIAG:  Muscle weakness (generalized)  Other lack of coordination  Parkinson's disease without dyskinesia or fluctuating manifestations  Unsteadiness on feet  Repeated falls  Rationale for Evaluation and Treatment Rehabilitation  SUBJECTIVE:                                                                                                                                                                                              SUBJECTIVE STATEMENT: Pt reports another fall. No injury sustained.   Pt accompanied by: [name] (caretaker)  PERTINENT HISTORY:   Date of most recent Admission: 09/25/2021 Date of Discharge: 09/28/2021  Discharge Diagnoses:  Altered mental status Parkinson disease (CMS/HCC) Hallucinations  Per [facility] discharge summary notes PHI : Hospital Course:   72 y.o. male with a history of Parkinson's Disease w/ DBS in place, PD-related dementia, OSA, depression, and seizure disorder who presented for EEG given concern for seizures.  Altered mental status Concern seizure activity Periods of unresponsiveness on the setting of new medication--Nuplazid (pimavanserin) recently started. No events of note on continuous EEG. This was held and he returned to his baseline mental status. Other workup including CT head, CTA h/n without obvious cause for periodic unresponsiveness. Nuplazid was discontinued.   Aspiration, weakness Of note, he had a coughing spell while eating though sounds like was precipitated by sneezing. Did well on RA, seen by SLP without any further needs or concerns.  Parkinson's Disease Parkinson's-related dementia - He follows in the [facility] Disorders center at [facility]. Clinic notes suggest he seems to have worsening of his overall disease trajectory. -  Continued home Memantine and Sinemet   PMH: HTN, bradycardia s/p PPM, PD, DBS, COVID-19 infection, chronic low back pain s/p L4/5 surgery.  PAIN:  Are you having pain? Yes: NPRS scale: 6/10 Pain location: Low back Pain description: ache Aggravating factors: Prolonged standing/sitting, bending over Relieving factors: rest, changing positions  OBJECTIVE: (objective measures completed at initial evaluation unless otherwise dated)     INTERVENTION THIS DATE:  -STS from chair 2x10 -AMB over ground 418f c 3lb AW bilat, upright USTEP walker  -basket ball toss/catch over 152f 30: 2x seated on dynadisc, 1x alternatinge standing, sitting  -// bars up/down stairs on soft, unlevel, unsteady, compliant surface: 10x bilat 2 hand support, 20x forward  BUE support  PATIENT EDUCATION: Education details: PT plan of care; purpose of functional outcomes Person educated: Patient and Spouse Education method: Explanation and Verbal cues Education comprehension: verbalized understanding   HOME EXERCISE PROGRAM: Access Code: [email or web address]: [email or web address] Date: 04/20/2022 Prepared by: ChRivka BarbaraExercises - Sit to stand, walk with assistive device to another chair, sit and repeat.   - 1 x daily - 7 x weekly - 2 sets - 10 reps - Standing March with Counter Support  - 1 x daily - 7 x weekly - 3 sets - 10 reps - Standing in "bowling stance" with counter support   - 1 x daily - 7 x weekly - 3 sets - 10 reps - Heel Raises with Counter Support  - 1 x daily - 7 x weekly - 3 sets - 10 reps - Seated shoulder row while sitting on stability disc   - 1 x daily - 7 x weekly - 3 sets - 10 reps    GOALS: Goals reviewed with patient? Yes  SHORT TERM GOALS: Target date: 01/11/2022  Pt will be independent with HEP in order to improve strength and balance in order to decrease fall risk and improve function at home and work.  Baseline: Patient reports not performing much in the way  of any HEP except walking. 12/27: Having phases of participation and phases of refusal to participate.  Goal status: IN PROGRESS  LONG TERM GOALS: Target date: 02/22/2022  Pt will decrease 5TSTS by at least 5 seconds in order to demonstrate clinically significant improvement in LE strength. Baseline: 02/28/2022: 19.5 sec with UE support  04/13/22:11.4 sec  Goal status: IN PROGRESS- continue to monitor for consistent progression   2.  Pt will improve FOTO to target score of 48 to display perceived improvements in ability to complete ADL's.  Baseline: 02/28/22=40 12/27: 45 Goal status: IN PROGRESS  3.  Pt will decrease TUG to < or = to 25 seconds/decrease in order to demonstrate decreased fall risk. Baseline: 02/28/22= 37.26 sec using upright 4WW 12/27:20.21 sec  Goal status: IN PROGRESS Keep assessing to ensure progress is maintained   4.  Pt will increase 10MWT by at least 0.13 m/s in order to demonstrate clinically significant improvement in community ambulation.   Baseline: 02/28/22= 0.67 m/s using upright 4WW 12/27: .89 m/s  Goal status: MET  5.  Patient will increase six minute walk test distance to >1000 for progression to community ambulator and improve gait ability  Baseline: 02/28/22: 720 feet with up walker 04/13/22: 815 feet with up walker  Goal status: IN PROGRESS  6.  Patient will experience 1 fall or less per 2 week period for at least 3 weeks in order to improve his frequency of falls and prevent injury from falls  Baseline:04/13/22: Pt has had 1 fall in the last 2 weeks to his knowledge Goal status: INITIAL  ASSESSMENT:  CLINICAL IMPRESSION: Continued with current plan of care as laid out in evaluation and recent prior sessions. All interventions tolerated as planned. Interventions aimed at targeting static and dynamic postural control, anticipatory postural control, multilevel  righting strategies, single and multiplanar movements, initiation/cessation of mobility, and  full body proprioception. Also incorporated components for maximizing pt's vestibular and visual systems for full functional reintegration. Recovery intervals given as needed based on signs of exertion and/or pt request. Pt remains motivated to advance progress toward goals in order to maximize independence and safety at home. Pt requires high level assistance and cuing for completion of exercises in order to provide adequate level of stimulation and perturbation. Author allows pt as much opportunity as possible to perform independent righting strategies, only providing physical assist when pt is unable correct LOB independently. The patient's therapy prognosis indicates continued potential for improvement, anticipate that future progress is attainable in a reasonable/predictable timeframe. Maximum improvement is within reach. Pt will continue to benefit from skilled PT services to address deficits and impairment identified in evaluation in order to maximize independence and safety in basic mobility required for performance of ADL, IADL, and leisure.   OBJECTIVE IMPAIRMENTS Abnormal gait, decreased activity tolerance, decreased balance, decreased cognition, decreased coordination, decreased endurance, decreased mobility, difficulty walking, decreased ROM, decreased strength, decreased safety awareness, hypomobility, impaired perceived functional ability, and pain.   ACTIVITY LIMITATIONS carrying, lifting, bending, standing, squatting, stairs, transfers, bed mobility, bathing, toileting, dressing, self feeding, hygiene/grooming, and caring for others  PARTICIPATION LIMITATIONS: meal prep, cleaning, laundry, medication management, personal finances, driving, shopping, community activity, and yard work  PERSONAL FACTORS Time since onset of injury/illness/exacerbation are also affecting patient's functional outcome.   REHAB POTENTIAL: Fair    CLINICAL DECISION MAKING: Evolving/moderate  complexity  EVALUATION COMPLEXITY: Moderate  PLAN: PT FREQUENCY: 2x/week  PT DURATION: 12 weeks  PLANNED INTERVENTIONS: Therapeutic exercises, Therapeutic activity, Neuromuscular re-education, Balance training, Gait training, Patient/Family education, Self Care, Joint mobilization, Joint manipulation, Stair training, Vestibular training, Canalith repositioning, DME instructions, Dry Needling, Electrical stimulation, Spinal manipulation, Spinal mobilization, Cryotherapy, Moist heat, Manual therapy, and Re-evaluation  PLAN FOR NEXT SESSION: Core strength, balance training   12:55 PM, 05/23/22 Etta Grandchild, PT, DPT Physical Therapist - [facility]  Outpatient Physical Therapy- [place] [phone]     Brunson C, PT 05/23/2022, 12:55 PM

## 2022-05-23 NOTE — Therapy (Signed)
OUTPATIENT OCCUPATIONAL THERAPY NEURO DISCHARGE NOTE  Patient Name: [name] MRN: [id number] DOB:07-24-1950, 72 y.o., male Today's Date: 05/23/2022  PCP: Dr. Elsie Stain REFERRING PROVIDER: Dr. Elsie Stain   OT End of Session - 05/23/22 1105     Visit Number 19    Number of Visits 24    Date for OT Re-Evaluation 05/23/22    Authorization Time Period Reporting period beginning 04/13/22    OT Start Time 62    OT Stop Time 1145    OT Time Calculation (min) 45 min    Equipment Utilized During Treatment rollator    Activity Tolerance Patient tolerated treatment well    Behavior During Therapy [facility] for tasks assessed/performed                Past Medical History:  Diagnosis Date   Arthritis    Bradycardia    Cancer ([place]) 2013   skin cancer   Depression    ptsd   Dysrhythmia    chronic slow heart rate   GERD (gastroesophageal reflux disease)    Headache(784.0)    tension headaches non recent   History of chicken pox    History of kidney stones    passed   Hypertension    treated with HCTZ   Pacemaker    Parkinson's disease    dx'ed 15 years ago   PTSD (post-traumatic stress disorder)    Shortness of breath dyspnea    Sleep apnea    doesn't use C-pap   Varicose veins    Past Surgical History:  Procedure Laterality Date   CHOLECYSTECTOMY N/A 10/22/2014   Procedure: LAPAROSCOPIC CHOLECYSTECTOMY WITH INTRAOPERATIVE CHOLANGIOGRAM;  Surgeon: Dia Crawford III, MD;  Location: [facility] ORS;  Service: General;  Laterality: N/A;   cyst removed      from lip as a child   INTRAMEDULLARY (IM) NAIL INTERTROCHANTERIC N/A 02/18/2019   Procedure: INTRAMEDULLARY (IM) NAIL INTERTROCHANTRIC, RIGHT,;  Surgeon: Thornton Park, MD;  Location: [facility] ORS;  Service: Orthopedics;  Laterality: N/A;   LUMBAR LAMINECTOMY/DECOMPRESSION MICRODISCECTOMY Bilateral 12/14/2012   Procedure: Bilateral lumbar three-four, four-five decompressive laminotomy/foraminotomy;  Surgeon: Charlie Pitter, MD;   Location: [place] NEURO ORS;  Service: Neurosurgery;  Laterality: Bilateral;   PULSE GENERATOR IMPLANT Bilateral 12/13/2013   Procedure: Bilateral implantable pulse generator placement;  Surgeon: Erline Levine, MD;  Location: [place] NEURO ORS;  Service: Neurosurgery;  Laterality: Bilateral;  Bilateral implantable pulse generator placement   skin cancer removed     from ears,   12 lft arm  rt leg 15   SUBTHALAMIC STIMULATOR BATTERY REPLACEMENT Bilateral 07/14/2017   Procedure: BILATERAL IMPLANTED PULSE GENERATOR CHANGE FOR DEEP BRAIN STIMULATOR;  Surgeon: Erline Levine, MD;  Location: [place];  Service: Neurosurgery;  Laterality: Bilateral;   SUBTHALAMIC STIMULATOR INSERTION Bilateral 12/06/2013   Procedure: SUBTHALAMIC STIMULATOR INSERTION;  Surgeon: Erline Levine, MD;  Location: [place] NEURO ORS;  Service: Neurosurgery;  Laterality: Bilateral;  Bilateral deep brain stimulator placement   Patient Active Problem List   Diagnosis Date Noted   Hematuria 10/10/2021   Sinus drainage 12/09/2020   Medicare annual wellness visit, subsequent 11/18/2020   Pacemaker    Dementia associated with Parkinson's disease ([place]) 06/17/2020   Vertigo 02/19/2020   Urine abnormality 02/19/2020   Dysphagia 02/23/2019   Fall at home 10/18/2017   REM behavioral disorder 11/02/2016   Radicular pain in right arm 10/09/2016   GERD (gastroesophageal reflux disease) 07/31/2016   Colon cancer screening 07/23/2015   Gout 02/18/2015  Depression 01/06/2015   Skin lesion 10/02/2014   Dupuytren's contracture 08/20/2014   Cough 07/21/2014   Lumbar stenosis with neurogenic claudication 06/13/2014   S/P deep brain stimulator placement 05/08/2014   Advance care planning 01/19/2014   Parkinson's disease 12/13/2013   PTSD (post-traumatic stress disorder) 06/13/2013   Erectile dysfunction 06/13/2013   HLD (hyperlipidemia) 06/13/2013   Essential hypertension 06/03/2013   Bradycardia by electrocardiogram 06/03/2013   Obstructive sleep apnea  03/12/2013    ONSET DATE: 2015  REFERRING DIAG: Parkinson's Disease  THERAPY DIAG:  Muscle weakness (generalized)  Other lack of coordination  Parkinson's disease without dyskinesia or fluctuating manifestations  Rationale for Evaluation and Treatment: Rehabilitation  SUBJECTIVE: Caregiver and spouse both present for d/c this date and both in agreement with plan.     SUBJECTIVE STATEMENT: Pt accompanied by:  caregiver [name])  PERTINENT HISTORY: Parkinson's Disease, dementia, DBS, multiple falls  PRECAUTIONS: Fall  WEIGHT BEARING RESTRICTIONS: No  PAIN:  Are you having pain? Yes: NPRS scale: 4/10 Pain location: hips and low back, R knee Pain description: sharp, tight Aggravating factors: if pt crosses feet too long/awkward positioning Relieving factors: voltaren gel   FALLS: Has patient fallen in last 6 months? Yes. Number of falls : spouse estimates 20  LIVING ENVIRONMENT: Lives with: lives with their spouse Lives in: house, 1 level Stairs: No Has following equipment at home: [place] entry, handicapped accessible bathroom  PLOF: Needs assistance with ADLs  PATIENT GOALS: increase bilat hand coordination for ADLs  OBJECTIVE:   HAND DOMINANCE: Right  ADLs: Overall ADLs: caregiver assist for all ADLs; pt currently as 16 hrs of caregiver assist per week through the [place].  Pt recently was assigned 100% disability by the [place], and spouse reports caregiver hours will increase to 46 per week, but uncertain when Transfers/ambulation related to ADLs: supv; pt uses upright 4 wheeled walker Eating: caregivers help to cut food Grooming: mod A to shave with an electric razor UB Dressing: min A, difficulty with buttons LB Dressing: mod A, difficulty with zippers and buttons Toileting: occasional accidents with bowel and bladder, but less than weekly and only during the night; distant supv for toileting during the day Bathing: min A for bathing, mod A for drying off Tub Shower  transfers: min guard for shower transfers with grab bar and shower chair Equipment:  fully handicapped shower and toilet  IADLs: Shopping: dep on caregiver Light housekeeping: dep Meal Prep: dep Community mobility: short distance community amb with supv and 4 wheeled walker Medication management: dep Financial management: dep Handwriting:  printed first/last name 50% legible, cursive first/last name illegible  MOBILITY STATUS: Hx of falls  POSTURE COMMENTS:  rounded shoulders, forward head, increased thoracic kyphosis, and flexed trunk   FUNCTIONAL OUTCOME MEASURES: FOTO: 40.2 (completed with PT); predicted 48  UPPER EXTREMITY ROM:   Pt demos BUE shoulder ROM WFL for self care  UPPER EXTREMITY MMT:     MMT Right eval Left eval  Shoulder flexion 5 5  Shoulder abduction 5 5  Shoulder adduction    Shoulder extension    Shoulder internal rotation    Shoulder external rotation    Middle trapezius    Lower trapezius    Elbow flexion 5 5  Elbow extension 5 5  Wrist flexion 5 5  Wrist extension 5 5  Wrist ulnar deviation    Wrist radial deviation    Wrist pronation    Wrist supination    (Blank rows = not tested)  HAND  FUNCTION: Grip strength: Right: 61 lbs; Left: 61 lbs, Lateral pinch: Right: 20 lbs, Left: 19 lbs, and 3 point pinch: Right: 11 lbs, Left: 15 lbs 04/13/22: Grip strength: Right: 66 lbs; Left: 65 lbs, Lateral pinch: Right: 18 lbs, Left: 18 lbs, and 3 point pinch: Right: 14 lbs, Left: 15 lbs 05/24/22: Grip strength: Right 69 lbs; Left: 75 lbs; Lateral pinch: Right: 22 lbs, Left: 27 lbs, and 3 point pinch: Right: 25 lbs; Left: 26 lbs  COORDINATION: Finger Nose Finger test: increased time, L side touched lower than tip, corrected with min vc 9 Hole Peg test: Right: 1 min 18 sec; Left: 41 sec 04/13/22: 9 Hole Peg test: Right: 1 min 4 sec; Left: 37 sec  05/24/22: 9 Hole Peg test: R: 45 sec; Left 37 sec   SENSATION: Proprioception: Impaired   EDEMA:  none  COGNITION: Overall cognitive status: History of cognitive impairments - at baseline; Parkinson's with dementia  VISION: Wears prism glasses; hx of visual hallucinations  PRAXIS: Impaired: Motor planning; bradykinesia, R>L  TODAY'S TREATMENT:  Therapeutic Exercise: Objective measures taken and goals updated for discharge assessment.  Reviewed list and provided handout of strengthening and coordination exercises to keep up with at home.  Pt was able to identify 5 activities from this list which he would enjoy doing at home to maintain strength and coordination in bilat hands.                                                                                                                                Therapeutic Activity: Facilitated FMC/dexterity/cognitive skills with participation in dice and chip game "Right, Left, Center."  Pt requires max vc for sequencing.  PATIENT EDUCATION: Education details: FMC/dexterity skills Person educated: caregiver, [name] Education method: Explanation, demo Education comprehension: verbalized understanding/demonstrated understanding  HOME EXERCISE PROGRAM: Putty, table games, cards   GOALS: Goals reviewed with patient? Yes  SHORT TERM GOALS: Target date: 04/11/22  1. Pt will complete HEP for increasing [place] and strength in bilat hands with direct supv at least 4 of 7 days per week. Baseline: not yet initiated; 04/03/22: caregiver reports pt has putty but rarely uses; 05/19/22: Caregiver unable to report how many days pt participates in an HEP as pt has another full time caregiver, but spouse and caregiver have both been provided with activities and exercises which pt can participate in with their set up/supv. Goal status: partially met  2. Pt will clean his glasses with min A and mod vc, including opening cleaning packet.   Baseline: max A; 04/13/22: able to clean with set up/supv with min vc.   Goal status: achieved   LONG TERM GOALS: Target  date: 05/23/22  Pt will increase FOTO score by 5 or more points to indicate increase in self/caregiver perceived functional performance with daily tasks.  Baseline: 40.2 (predicted 48); ongoing (PT will complete) Goal status: ongoing (PT completes)  2.  Pt will increase R/L lateral  pinch strength by 4 or more lbs to enable pt to independently turn on electric toothbrush and twist open toothpaste. Baseline: R 20, L 19; caregiver assist to turn on toothbrush and open toothpaste; 04/13/22: B 18 lbs, pt still struggles with above activities; 05/24/22: R 22, L 27 (pt has putty to continue strengthening at home); indep to turn on toothbrush and twist open cap  Goal status: partially met (R increased by 2#, L increased by 8#)  3.  Pt will increase [place] in bilat hands to manage buttons and zippers on clothing 75% of the time.  Baseline: Eval: 9 hole peg test: Right: 1 min 18 sec; Left: 41 sec; caregivers assist with clothing fasteners at least 50% of the time; 04/13/22: caregiver reports pt requires assist to set up zipper on coat, and pt can manage buttons on jeans and zipper, occasionally having to lie flat on back to manage fasteners.  Pt can manage buttons with extra time on shirt (9 hole: R 1 min 4 sec, L 37 sec); 05/24/22: Pt now managing buttons and zippers 100% of the time.  Pt no longer wears his stiff jeans and spouse has replaced these with looser clothing which pt can better manage; (9 hole R 45 sec, L 37 sec) Goal status: achieved  4.  Pt will scoop food from plate/bowl with min vc using adapted utensils and adapted plates/bowls as needed.  Baseline: caregiver constantly turns plate to compensate for pt pushing food to 1 side; 04/13/22: pt now uses divided plate which allows pt to scoop his food without having spouse turn his plate or bowl.  Pt continues to use built up eating utensils. Goal status: achieved  5.  Pt will shave with electric razor with min A for thoroughness. Baseline: pt struggles  to grade sufficient pressure to thoroughly shave face, requiring mod A; 04/13/22: Spouse is waiting for a nicer razor from the [place], so last few times barber will shave him when he's getting a hair cut; 05/24/22: pt uses electric razor with min A for thoroughness. Goal status: achieved   ASSESSMENT:  CLINICAL IMPRESSION: Pt seen this date for discharge assessment.  Pt has been seen by OT for 19 visits for increasing bilat hand strength, coordination, and basic self care skills.  Pt demos good improvements in strength and coordination; all measures improved from eval.  Pt can now clean his glasses, manage buttons and zippers on clothing, turn on his electric toothbrush and twist open toothpaste cap all with set up.  Pt is now shaving with his electric razor with min A for thoroughness.  Use of a divided plate or bowl has now allowed pt to scoop food independently without caregiver having to constantly turn plate throughout the meal.  Caregivers are indep with HEP with multiple activities to choose from at home to promote strengthening and coordination skills in bilat hands.  No additional skilled OT indicated at this time.  Pt/caregiver in agreement with plan.  PERFORMANCE DEFICITS: in functional skills including ADLs, IADLs, coordination, dexterity, proprioception, strength, pain, Fine motor control, Gross motor control, mobility, balance, body mechanics, endurance, decreased knowledge of use of DME, and UE functional use, cognitive skills including attention, learn, memory, orientation, problem solving, safety awareness, thought, and understand. IMPAIRMENTS: are limiting patient from ADLs, IADLs, rest and sleep, leisure, and social participation.   CO-MORBIDITIES: has co-morbidities such as dementia  that affects occupational performance. Patient will benefit from skilled OT to address above impairments and improve overall function.  MODIFICATION  OR ASSISTANCE TO COMPLETE EVALUATION: Min-Moderate  modification of tasks or assist with assess necessary to complete an evaluation.  OT OCCUPATIONAL PROFILE AND HISTORY: Problem focused assessment: Including review of records relating to presenting problem.  CLINICAL DECISION MAKING: Moderate - several treatment options, min-mod task modification necessary  REHAB POTENTIAL: Good  EVALUATION COMPLEXITY: Moderate    PLAN:  OT FREQUENCY: 2x/week  OT DURATION: 12 weeks  PLANNED INTERVENTIONS: self care/ADL training, therapeutic exercise, therapeutic activity, neuromuscular re-education, manual therapy, balance training, functional mobility training, moist heat, cryotherapy, patient/family education, cognitive remediation/compensation, energy conservation, coping strategies training, and DME and/or AE instructions  RECOMMENDED OTHER SERVICES: N/A  CONSULTED AND AGREED WITH PLAN OF CARE: Patient and family member/caregiver  PLAN FOR NEXT SESSION: N/A; d/c this date Leta Speller, MS, OTR/L  Darleene Cleaver, OT 05/23/2022, 11:49 AM

## 2022-05-24 ENCOUNTER — Ambulatory Visit (INDEPENDENT_AMBULATORY_CARE_PROVIDER_SITE_OTHER): Payer: Medicare PPO | Admitting: Psychology

## 2022-05-24 DIAGNOSIS — F331 Major depressive disorder, recurrent, moderate: Secondary | ICD-10-CM | POA: Diagnosis not present

## 2022-05-24 DIAGNOSIS — F4323 Adjustment disorder with mixed anxiety and depressed mood: Secondary | ICD-10-CM | POA: Diagnosis not present

## 2022-05-24 LAB — CUP PACEART REMOTE DEVICE CHECK
Battery Remaining Longevity: 144 mo
Battery Voltage: 3.03 V
Brady Statistic AP VP Percent: 0.04 %
Brady Statistic AP VS Percent: 97.59 %
Brady Statistic AS VP Percent: 0 %
Brady Statistic AS VS Percent: 2.38 %
Brady Statistic RA Percent Paced: 97.62 %
Brady Statistic RV Percent Paced: 0.04 %
Date Time Interrogation Session: 20240206003321
Implantable Lead Connection Status: 753985
Implantable Lead Connection Status: 753985
Implantable Lead Implant Date: 20220506
Implantable Lead Implant Date: 20220506
Implantable Lead Location: 753859
Implantable Lead Location: 753860
Implantable Lead Model: 5076
Implantable Lead Model: 5076
Implantable Pulse Generator Implant Date: 20220506
Lead Channel Impedance Value: 323 Ohm
Lead Channel Impedance Value: 342 Ohm
Lead Channel Impedance Value: 437 Ohm
Lead Channel Impedance Value: 551 Ohm
Lead Channel Pacing Threshold Amplitude: 0.75 V
Lead Channel Pacing Threshold Amplitude: 0.875 V
Lead Channel Pacing Threshold Pulse Width: 0.4 ms
Lead Channel Pacing Threshold Pulse Width: 0.4 ms
Lead Channel Sensing Intrinsic Amplitude: 2.5 mV
Lead Channel Sensing Intrinsic Amplitude: 2.5 mV
Lead Channel Sensing Intrinsic Amplitude: 8.375 mV
Lead Channel Sensing Intrinsic Amplitude: 8.375 mV
Lead Channel Setting Pacing Amplitude: 1.75 V
Lead Channel Setting Pacing Amplitude: 2 V
Lead Channel Setting Pacing Pulse Width: 0.4 ms
Lead Channel Setting Sensing Sensitivity: 1.2 mV
Zone Setting Status: 755011
Zone Setting Status: 755011

## 2022-05-25 ENCOUNTER — Ambulatory Visit: Payer: Medicare PPO | Admitting: Physical Therapy

## 2022-05-25 ENCOUNTER — Encounter: Payer: Self-pay | Admitting: Physical Therapy

## 2022-05-25 ENCOUNTER — Ambulatory Visit: Payer: Medicare PPO

## 2022-05-25 DIAGNOSIS — R269 Unspecified abnormalities of gait and mobility: Secondary | ICD-10-CM | POA: Diagnosis not present

## 2022-05-25 DIAGNOSIS — G20A1 Parkinson's disease without dyskinesia, without mention of fluctuations: Secondary | ICD-10-CM | POA: Diagnosis not present

## 2022-05-25 DIAGNOSIS — R262 Difficulty in walking, not elsewhere classified: Secondary | ICD-10-CM

## 2022-05-25 DIAGNOSIS — R2681 Unsteadiness on feet: Secondary | ICD-10-CM | POA: Diagnosis not present

## 2022-05-25 DIAGNOSIS — R278 Other lack of coordination: Secondary | ICD-10-CM | POA: Diagnosis not present

## 2022-05-25 DIAGNOSIS — R296 Repeated falls: Secondary | ICD-10-CM | POA: Diagnosis not present

## 2022-05-25 DIAGNOSIS — M6281 Muscle weakness (generalized): Secondary | ICD-10-CM | POA: Diagnosis not present

## 2022-05-25 NOTE — Therapy (Addendum)
OUTPATIENT PHYSICAL THERAPY NEURO Treatment / [place]     Patient Name: [name] MRN: [id number] DOB:24-Sep-1950, 72 y.o., male Today's Date: 05/25/2022   PCP: Dr. Elsie Stain REFERRING PROVIDER: Dr. Sharolyn Douglas   PT End of Session - 05/25/22 1150     Visit Number 19    Number of Visits 24    Date for PT Re-Evaluation 05/23/22    Authorization Type [employer] Choice PPO    Authorization Time Period Cert through 09/23/10    Progress Note Due on Visit 20    PT Start Time 1145    PT Stop Time 1228    PT Time Calculation (min) 43 min    Equipment Utilized During Treatment Gait belt    Activity Tolerance Patient tolerated treatment well    Behavior During Therapy WFL for tasks assessed/performed                           Past Medical History:  Diagnosis Date   Arthritis    Bradycardia    Cancer ([place]) 2013   skin cancer   Depression    ptsd   Dysrhythmia    chronic slow heart rate   GERD (gastroesophageal reflux disease)    Headache(784.0)    tension headaches non recent   History of chicken pox    History of kidney stones    passed   Hypertension    treated with HCTZ   Pacemaker    Parkinson's disease    dx'ed 15 years ago   PTSD (post-traumatic stress disorder)    Shortness of breath dyspnea    Sleep apnea    doesn't use C-pap   Varicose veins    Past Surgical History:  Procedure Laterality Date   CHOLECYSTECTOMY N/A 10/22/2014   Procedure: LAPAROSCOPIC CHOLECYSTECTOMY WITH INTRAOPERATIVE CHOLANGIOGRAM;  Surgeon: Dia Crawford III, MD;  Location: [facility] OR[facility];  Service: General;  Laterality: N/A;   cyst removed      from lip as a child   INTRAMEDULLARY (IM) NAIL INTERTROCHANTERIC N/A 02/18/2019   Procedure: INTRAMEDULLARY (IM) NAIL INTERTROCHANTRIC, RIGHT,;  Surgeon: Thornton Park, MD;  Location: [facility];  Service: Orthopedics;  Laterality: N/A;   LUMBAR LAMINECTOMY/DECOMPRESSION MICRODISCECTOMY Bilateral 12/14/2012   Procedure:  Bilateral lumbar three-four, four-five decompressive laminotomy/foraminotomy;  Surgeon: Charlie Pitter, MD;  Location: [place];  Service: Neurosurgery;  Laterality: Bilateral;   PULSE GENERATOR IMPLANT Bilateral 12/13/2013   Procedure: Bilateral implantable pulse generator placement;  Surgeon: Erline Levine, MD;  Location: [place] [facility];  Service: Neurosurgery;  Laterality: Bilateral;  Bilateral implantable pulse generator placement   skin cancer removed     from ears,   12 lft arm  rt leg 15   SUBTHALAMIC STIMULATOR BATTERY REPLACEMENT Bilateral 07/14/2017   Procedure: BILATERAL IMPLANTED PULSE GENERATOR CHANGE FOR DEEP BRAIN STIMULATOR;  Surgeon: Erline Levine, MD;  Location: [facility];  Service: Neurosurgery;  Laterality: Bilateral;   SUBTHALAMIC STIMULATOR INSERTION Bilateral 12/06/2013   Procedure: SUBTHALAMIC STIMULATOR INSERTION;  Surgeon: Erline Levine, MD;  Location: [facility];  Service: Neurosurgery;  Laterality: Bilateral;  Bilateral deep brain stimulator placement   Patient Active Problem List   Diagnosis Date Noted   Hematuria 10/10/2021   Sinus drainage 12/09/2020   Medicare annual wellness visit, subsequent 11/18/2020   Pacemaker    Dementia associated with Parkinson's disease ([place]) 06/17/2020   Vertigo 02/19/2020   Urine abnormality 02/19/2020   Dysphagia 02/23/2019   Fall at  home 10/18/2017   REM behavioral disorder 11/02/2016   Radicular pain in right arm 10/09/2016   GERD (gastroesophageal reflux disease) 07/31/2016   Colon cancer screening 07/23/2015   Gout 02/18/2015   Depression 01/06/2015   Skin lesion 10/02/2014   Dupuytren's contracture 08/20/2014   Cough 07/21/2014   Lumbar stenosis with neurogenic claudication 06/13/2014   S/P deep brain stimulator placement 05/08/2014   Advance care planning 01/19/2014   Parkinson's disease 12/13/2013   PTSD (post-traumatic stress disorder) 06/13/2013   Erectile dysfunction 06/13/2013   HLD (hyperlipidemia) 06/13/2013    Essential hypertension 06/03/2013   Bradycardia by electrocardiogram 06/03/2013   Obstructive sleep apnea 03/12/2013    ONSET DATE: 15 years ago per chart  REFERRING DIAG: Parkinson's disease  THERAPY DIAG:  Parkinson's disease without dyskinesia or fluctuating manifestations  Unsteadiness on feet  Repeated falls  Abnormality of gait and mobility  Difficulty in walking, not elsewhere classified  Rationale for Evaluation and Treatment Rehabilitation  SUBJECTIVE:                                                                                                                                                                                              SUBJECTIVE STATEMENT: Pt reports another fall when falling into bookcase again. No injury other than small laceration on side.   Pt accompanied by: [name] (caretaker)  PERTINENT HISTORY:   Date of most recent Admission: 09/25/2021 Date of Discharge: 09/28/2021  Discharge Diagnoses:  Altered mental status Parkinson disease (CMS/HCC) Hallucinations  Per [facility] discharge summary notes PHI : Hospital Course:   72 y.o. male with a history of Parkinson's Disease w/ DBS in place, PD-related dementia, OSA, depression, and seizure disorder who presented for EEG given concern for seizures.  Altered mental status Concern seizure activity Periods of unresponsiveness on the setting of new medication--Nuplazid (pimavanserin) recently started. No events of note on continuous EEG. This was held and he returned to his baseline mental status. Other workup including CT head, CTA h/n without obvious cause for periodic unresponsiveness. Nuplazid was discontinued.   Aspiration, weakness Of note, he had a coughing spell while eating though sounds like was precipitated by sneezing. Did well on RA, seen by SLP without any further needs or concerns.  Parkinson's Disease Parkinson's-related dementia - He follows in the [facility] Disorders center at  [facility]. Clinic notes suggest he seems to have worsening of his overall disease trajectory. - Continued home Memantine and Sinemet   PMH: HTN, bradycardia s/p PPM, PD, DBS, COVID-19 infection, chronic low back pain s/p L4/5 surgery.  PAIN:  Are you having pain? Yes: NPRS scale: 6/10  Pain location: Low back Pain description: ache Aggravating factors: Prolonged standing/sitting, bending over Relieving factors: rest, changing positions   OBJECTIVE: (objective measures completed at initial evaluation unless otherwise dated)     INTERVENTION THIS DATE:  Physical therapy treatment session today consisted of completing assessment of goals and administration of testing as demonstrated and documented in flow sheet, treatment, and goals section of this note. Addition treatments may be found below.   PWR! Up combined with step for FLOW x 5 reps - added metronome for 5 reps, difficulty with sequencing so broke down tasks as below  10 x ea side PWR! Step with metronome at 70 BPM, able to complete well with one incidence of LOB.   PATIENT EDUCATION: Education details: PT plan of care; purpose of functional outcomes Person educated: Patient and Spouse Education method: Explanation and Verbal cues Education comprehension: verbalized understanding   HOME EXERCISE PROGRAM: Access Code: [email or web address] URL: [email or web address] Date: 04/20/2022 Prepared by: Rivka Barbara  Exercises - Sit to stand, walk with assistive device to another chair, sit and repeat.   - 1 x daily - 7 x weekly - 2 sets - 10 reps - Standing March with Counter Support  - 1 x daily - 7 x weekly - 3 sets - 10 reps - Standing in "bowling stance" with counter support   - 1 x daily - 7 x weekly - 3 sets - 10 reps - Heel Raises with Counter Support  - 1 x daily - 7 x weekly - 3 sets - 10 reps - Seated shoulder row while sitting on stability disc   - 1 x daily - 7 x weekly - 3 sets - 10 reps    GOALS: Goals  reviewed with patient? Yes  SHORT TERM GOALS: Target date: 01/11/2022  Pt will be independent with HEP in order to improve strength and balance in order to decrease fall risk and improve function at home and work.  Baseline: Patient reports not performing much in the way of any HEP except walking. 12/27: Having phases of participation and phases of refusal to participate.  Goal status: IN PROGRESS  LONG TERM GOALS: Target date: 02/22/2022  Pt will decrease 5TSTS by at least 5 seconds in order to demonstrate clinically significant improvement in LE strength. Baseline: 02/28/2022: 19.5 sec with UE support  04/13/22:11.4 sec 05/25/22: 11.9 sec  Goal status: MET- continue to monitor for consistent progression   2.  Pt will improve FOTO to target score of 48 to display perceived improvements in ability to complete ADL's.  Baseline: 02/28/22=40 12/27: 45 Goal status: IN PROGRESS  3.  Pt will decrease TUG to < or = to 25 seconds/decrease in order to demonstrate decreased fall risk. Baseline: 02/28/22= 37.26 sec using upright 4WW 12/27:20.21 sec 05/25/22: 27.1 sec ( does not take brakes off without cues)  Goal status: IN PROGRESS Keep assessing to ensure progress is maintained   4.  Pt will increase 10MWT by at least 0.13 m/s in order to demonstrate clinically significant improvement in community ambulation.   Baseline: 02/28/22= 0.67 m/s using upright 4WW 12/27: .89 m/s  Goal status: MET  5.  Patient will increase six minute walk test distance to >1000 for progression to community ambulator and improve gait ability  Baseline: 02/28/22: 720 feet with up walker 04/13/22: 815 feet with up walker 05/25/22: 877 feet with Up walker  Goal status: IN PROGRESS  6.  Patient will experience 1 fall or less per 2 week period for at  least 3 weeks in order to improve his frequency of falls and prevent injury from falls  Baseline:04/13/22: Pt has had 1 fall in the last 2 weeks to his knowledge 05/25/22: 2 falls in  last 2 weeks  Goal status: IN PROGRESS  ASSESSMENT:  CLINICAL IMPRESSION: Patient presents for re certification note this date.  Patient demonstrates significant progress in his lower extremity strength as well as concurrent reduction in fall risk secondary to strength deficits.  However, patient still is following regularly secondary to hallucinations and attempts to ambulate without his assistive device.  Will continue to work on balance interventions to promote improved balance responses in situations where he has rash such as when he has his hallucination and is rushing to get to a door to check things out.  Patient also makes progress with 6-minute walk test indicating improved endurance and ability to ambulate within the community.Patient's condition has the potential to improve in response to therapy. Maximum improvement is yet to be obtained. The anticipated improvement is attainable and reasonable in a generally predictable time.      OBJECTIVE IMPAIRMENTS Abnormal gait, decreased activity tolerance, decreased balance, decreased cognition, decreased coordination, decreased endurance, decreased mobility, difficulty walking, decreased ROM, decreased strength, decreased safety awareness, hypomobility, impaired perceived functional ability, and pain.   ACTIVITY LIMITATIONS carrying, lifting, bending, standing, squatting, stairs, transfers, bed mobility, bathing, toileting, dressing, self feeding, hygiene/grooming, and caring for others  PARTICIPATION LIMITATIONS: meal prep, cleaning, laundry, medication management, personal finances, driving, shopping, community activity, and yard work  PERSONAL FACTORS Time since onset of injury/illness/exacerbation are also affecting patient's functional outcome.   REHAB POTENTIAL: Fair    CLINICAL DECISION MAKING: Evolving/moderate complexity  EVALUATION COMPLEXITY: Moderate  PLAN: PT FREQUENCY: 2x/week  PT DURATION: 12 weeks  PLANNED  INTERVENTIONS: Therapeutic exercises, Therapeutic activity, Neuromuscular re-education, Balance training, Gait training, Patient/Family education, Self Care, Joint mobilization, Joint manipulation, Stair training, Vestibular training, Canalith repositioning, DME instructions, Dry Needling, Electrical stimulation, Spinal manipulation, Spinal mobilization, Cryotherapy, Moist heat, Manual therapy, and Re-evaluation  PLAN FOR NEXT SESSION: Core strength, balance training   3:12 PM, 05/25/22 Particia Lather PT ,DPT Physical Therapist- [facility]

## 2022-05-25 NOTE — Progress Notes (Signed)
[facility] Counselor/Therapist Progress Note  Patient ID: [name], MRN: [id number],    Date: 05/24/2022  Time Spent: 45 minutes  Treatment Type: Indivdual  Reported Symptoms: sadness, and boredom  Mental Status Exam: Appearance:  Casual     Behavior: normal  Motor: Psychomotor Retardation  Speech/Language:  Slow  Affect: flat  Mood: normal  Thought process: concrete  Thought content:   WNL  Sensory/Perceptual disturbances:   WNL  Orientation: oriented to person, place, time/date, and situation  Attention: Fair  Concentration: Fair  Memory: Immediate;   [place] of knowledge:  Fair  Insight:   Fair  Judgment:  Fair  Impulse Control: Fair   Risk Assessment: Danger to Self:  No Self-injurious Behavior: No Danger to Others: No Duty to Warn:no Physical Aggression / Violence:No  Access to Firearms a concern: No  Gang Involvement:No   Subjective: The patient attended a face-to-face individual therapy session  via video visit today.  The patient gave verbal consent for the video to be on WebEx.  The patient was in his home  and therapist was in the office.  The patient presents with a flat affect and mood is pleasant.  The patient seems better able to have conversation today.  He is able to verbalize better without drifting off.  He continues to have difficulty with staying on topic at times.  We talked about him going to the beach this coming week and he seems relatively happy about this.  The patient does report that he is sad but he cannot verbalize why he feels sad.  He states that things are going about the same and that there is nothing that he is extremely concerned about.  Provided supportive therapy for him during this session today and we will continue to do so every 3 weeks.   Interventions: Ego-Supportive, CBT  Diagnosis:Major depressive disorder, recurrent episode, moderate (HCC)  Adjustment disorder with mixed anxiety and depressed  mood  Plan: Client Abilities/Strengths  Pt is bright and has a good sense of humor.  Client Treatment Preferences  Individual therapy.  Client Statement of Needs  improve coping skills regarding chronic illness.  Treatment Level  Outpatient Individual therapy  Symptoms  Depressed or irritable mood.: No Description Entered (Status: improved). Diminished interest in or  enjoyment of activities.: No Description Entered (Status: maintained). Feelings of hopelessness,  worthlessness, or inappropriate guilt.: No Description Entered (Status: improved). Lack of energy.: No  Description Entered (Status: maintained). Psychomotor agitation or retardation.: No Description  Entered (Status: maintained). Social withdrawal.: No Description Entered (Status: improved).  Problems Addressed  Unipolar Depression, Unipolar Depression, Unipolar Depression, Unipolar Depression  Goals 1. Alleviate depressive symptoms and return to previous level of effective  functioning. 2. Develop healthy interpersonal relationships that lead to the alleviation  and help prevent the relapse of depression. Objective Describe current and past experiences with depression including their impact on functioning and  attempts to resolve it. Target Date: 2023-05-01 Frequency: Monthly Progress: 60 Modality: individual Related Interventions 1. Encourage the client to share his/her thoughts and feelings of depression; express empathy and  build rapport while identifying primary cognitive, behavioral, interpersonal, or other  contributors to depression. Objective Learn and implement behavioral strategies to overcome depression. Target Date: 2023-05-01 Frequency: Monthly Progress: 80 Modality: individual Objective Identify and replace thoughts and beliefs that support depression. Target Date: 2023-05-01 Frequency: Monthly Progress: 70 Modality: individual Related Interventions 1. Conduct Cognitive-Behavioral Therapy (see  Cognitive Behavior Therapy by Olevia Bowens; Overcoming  Depression by Lynita Lombard al.), beginning with helping the client learn the connection among  cognition, depressive feelings, and actions. 2. Explore and restructure underlying assumptions and beliefs reflected in biased self-talk that  may put the client at risk for relapse or recurrence. 3. Facilitate and reinforce the client's shift from biased depressive self-talk and beliefs to realitybased cognitive messages that enhance self-confidence and increase adaptive actions (see  "Positive Self-Talk" in the Adult Psychotherapy Homework Planner by Bryn Gulling). Objective Learn and implement problem-solving and decision-making skills. Target Date: 2023-05-01 Frequency: Monthly Progress: 70 Modality: individual Related Interventions 1. Encourage in the client the development of a positive problem orientation in which problems  and solving them are viewed as a natural part of life and not something to be feared, despaired,  or avoided. 2. Conduct Problem-Solving Therapy (see Problem-Solving Therapy by Shawnee Knapp and Delane Ginger) using techniques such as psychoeducation, modeling, and role-playing to teach client problem-solving skills (i.e., defining a problem specifically, generating possible solutions, evaluating the pros  and cons of each solution, selecting and implementing a plan of action, evaluating the efficacy  of the plan, accepting or revising the plan); role-play application of the problem-solving skill to  a real life issue (or assign "Applying Problem-Solving to Interpersonal Conflict" in the Adult  Psychotherapy Homework Planner by Bryn Gulling). 3. Develop healthy thinking patterns and beliefs about self, others, and the world that lead to the alleviation and help prevent the relapse of  depression. 4. Recognize, accept, and cope with feelings of depression. Diagnosis Axis  none F43.21 (Adjustment Disorder, With depressed mood) - Open -   [Signifier: n/a]  Adjustment Disorder,  With Depressed Mood  Axis  none 296.32 (Major depressive affective disorder, recurrent episode,  moderate) - Open - [Signifier: n/a]  Conditions For Discharge Achievement of treatment goals and objectives   Plan is to continue to offer supportive therapy to patient to help him deal with his depression related to his Parkinsons diagnosis.  Patient approved Treatment Plan   Zamaya Rapaport Rejeana Brock, LCSW

## 2022-05-25 NOTE — Progress Notes (Signed)
  Physician Documentation Your signature is required to indicate approval of the treatment plan as stated above. By signing this report, you are approving the plan of care. Please sign and either send electronically or print and fax the signed copy to the number below. If you approve with modifications, please indicate those in the space provided._______________________________________________________________ Physician Signature: ____Graham Damita Dunnings Date:___02/07/24 ____ Time:__4:26 PM___

## 2022-05-25 NOTE — Addendum Note (Signed)
Addended by: Rivka Barbara B on: 05/25/2022 03:15 PM   Modules accepted: Orders

## 2022-05-26 ENCOUNTER — Ambulatory Visit: Payer: Medicare PPO | Admitting: Dermatology

## 2022-05-30 ENCOUNTER — Ambulatory Visit: Payer: Medicare PPO | Admitting: Physical Therapy

## 2022-05-30 ENCOUNTER — Ambulatory Visit: Payer: Medicare PPO

## 2022-06-01 ENCOUNTER — Ambulatory Visit: Payer: Medicare PPO | Admitting: Physical Therapy

## 2022-06-01 ENCOUNTER — Ambulatory Visit: Payer: Medicare PPO

## 2022-06-06 ENCOUNTER — Ambulatory Visit: Payer: Medicare PPO | Admitting: Physical Therapy

## 2022-06-06 ENCOUNTER — Encounter: Payer: Self-pay | Admitting: Physical Therapy

## 2022-06-06 ENCOUNTER — Ambulatory Visit: Payer: Medicare PPO

## 2022-06-06 DIAGNOSIS — R262 Difficulty in walking, not elsewhere classified: Secondary | ICD-10-CM | POA: Diagnosis not present

## 2022-06-06 DIAGNOSIS — R269 Unspecified abnormalities of gait and mobility: Secondary | ICD-10-CM | POA: Diagnosis not present

## 2022-06-06 DIAGNOSIS — M6281 Muscle weakness (generalized): Secondary | ICD-10-CM

## 2022-06-06 DIAGNOSIS — R296 Repeated falls: Secondary | ICD-10-CM

## 2022-06-06 DIAGNOSIS — G20A1 Parkinson's disease without dyskinesia, without mention of fluctuations: Secondary | ICD-10-CM

## 2022-06-06 DIAGNOSIS — R2681 Unsteadiness on feet: Secondary | ICD-10-CM | POA: Diagnosis not present

## 2022-06-06 DIAGNOSIS — R278 Other lack of coordination: Secondary | ICD-10-CM | POA: Diagnosis not present

## 2022-06-06 NOTE — Therapy (Signed)
OUTPATIENT PHYSICAL THERAPY NEURO Treatment / Physical Therapy Progress Note   Dates of reporting period   04/13/22  to   06/06/22     Patient Name: [name] MRN: [id number] DOB:[birth year], 72 y.o., male Today's Date: 06/06/2022   PCP: Dr. Elsie Mcgee REFERRING PROVIDER: Dr. Sharolyn Mcgee   PT End of Session - 06/06/22 1145     Visit Number 20    Number of Visits 24    Date for PT Re-Evaluation 08/15/22    Authorization Type [employer] Choice PPO    Authorization Time Period Cert through [id number]    Progress Note Due on Visit 20    PT Start Time 1140    PT Stop Time 1222    PT Time Calculation (min) 42 min    Equipment Utilized During Treatment Gait belt    Activity Tolerance Patient tolerated treatment well    Behavior During Therapy WFL for tasks assessed/performed                            Past Medical History:  Diagnosis Date   Arthritis    Bradycardia    Cancer ([place]) 2013   skin cancer   Depression    ptsd   Dysrhythmia    chronic slow heart rate   GERD (gastroesophageal reflux disease)    Headache(784.0)    tension headaches non recent   History of chicken pox    History of kidney stones    passed   Hypertension    treated with HCTZ   Pacemaker    Parkinson's disease    dx'ed 15 years ago   PTSD (post-traumatic stress disorder)    Shortness of breath dyspnea    Sleep apnea    doesn't use C-pap   Varicose veins    Past Surgical History:  Procedure Laterality Date   CHOLECYSTECTOMY N/A 10/22/2014   Procedure: LAPAROSCOPIC CHOLECYSTECTOMY WITH INTRAOPERATIVE CHOLANGIOGRAM;  Surgeon: George Crawford III, MD;  Location: [facility];  Service: General;  Laterality: N/A;   cyst removed      from lip as a child   INTRAMEDULLARY (IM) NAIL INTERTROCHANTERIC N/A 02/18/2019   Procedure: INTRAMEDULLARY (IM) NAIL INTERTROCHANTRIC, RIGHT,;  Surgeon: George Park, MD;  Location: [facility];  Service: Orthopedics;  Laterality: N/A;   LUMBAR  LAMINECTOMY/DECOMPRESSION MICRODISCECTOMY Bilateral 12/14/2012   Procedure: Bilateral lumbar three-four, four-five decompressive laminotomy/foraminotomy;  Surgeon: George Pitter, MD;  Location: [place] [facility];  Service: Neurosurgery;  Laterality: Bilateral;   PULSE GENERATOR IMPLANT Bilateral 12/13/2013   Procedure: Bilateral implantable pulse generator placement;  Surgeon: George Levine, MD;  Location: [place] [facility];  Service: Neurosurgery;  Laterality: Bilateral;  Bilateral implantable pulse generator placement   skin cancer removed     from ears,   12 lft arm  rt leg 15   SUBTHALAMIC STIMULATOR BATTERY REPLACEMENT Bilateral 07/14/2017   Procedure: BILATERAL IMPLANTED PULSE GENERATOR CHANGE FOR DEEP BRAIN STIMULATOR;  Surgeon: George Levine, MD;  Location: [place];  Service: Neurosurgery;  Laterality: Bilateral;   SUBTHALAMIC STIMULATOR INSERTION Bilateral 12/06/2013   Procedure: SUBTHALAMIC STIMULATOR INSERTION;  Surgeon: George Levine, MD;  Location: [place] [facility];  Service: Neurosurgery;  Laterality: Bilateral;  Bilateral deep brain stimulator placement   Patient Active Problem List   Diagnosis Date Noted   Hematuria 10/10/2021   Sinus drainage 12/09/2020   Medicare annual wellness visit, subsequent 11/18/2020   Pacemaker    Dementia associated with Parkinson's disease ([place])  06/17/2020   Vertigo 02/19/2020   Urine abnormality 02/19/2020   Dysphagia 02/23/2019   Fall at home 10/18/2017   REM behavioral disorder 11/02/2016   Radicular pain in right arm 10/09/2016   GERD (gastroesophageal reflux disease) 07/31/2016   Colon cancer screening 07/23/2015   Gout 02/18/2015   Depression 01/06/2015   Skin lesion 10/02/2014   Dupuytren's contracture 08/20/2014   Cough 07/21/2014   Lumbar stenosis with neurogenic claudication 06/13/2014   S/P deep brain stimulator placement 05/08/2014   Advance care planning 01/19/2014   Parkinson's disease 12/13/2013   PTSD (post-traumatic stress disorder) 06/13/2013    Erectile dysfunction 06/13/2013   HLD (hyperlipidemia) 06/13/2013   Essential hypertension 06/03/2013   Bradycardia by electrocardiogram 06/03/2013   Obstructive sleep apnea 03/12/2013    ONSET DATE: 15 years ago per chart  REFERRING DIAG: Parkinson's disease  THERAPY DIAG:  Parkinson's disease without dyskinesia or fluctuating manifestations  Unsteadiness on feet  Repeated falls  Abnormality of gait and mobility  Difficulty in walking, not elsewhere classified  Muscle weakness (generalized)  Rationale for Evaluation and Treatment Rehabilitation  SUBJECTIVE:                                                                                                                                                                                              SUBJECTIVE STATEMENT: Pt caregiver reports difficulties with cognition secondary to not having patch that assists with cognition.   Pt accompanied by: [name] (caretaker)  PERTINENT HISTORY:   Date of most recent Admission: 09/25/2021 Date of Discharge: 09/28/2021  Discharge Diagnoses:  Altered mental status Parkinson disease (CMS/HCC) Hallucinations  Per [facility] discharge summary notes PHI : Hospital Course:   72 y.o. male with a history of Parkinson's Disease w/ DBS in place, PD-related dementia, OSA, depression, and seizure disorder who presented for EEG given concern for seizures.  Altered mental status Concern seizure activity Periods of unresponsiveness on the setting of new medication--Nuplazid (pimavanserin) recently started. No events of note on continuous EEG. This was held and he returned to his baseline mental status. Other workup including CT head, CTA h/n without obvious cause for periodic unresponsiveness. Nuplazid was discontinued.   Aspiration, weakness Of note, he had a coughing spell while eating though sounds like was precipitated by sneezing. Did well on RA, seen by SLP without any further needs or  concerns.  Parkinson's Disease Parkinson's-related dementia - He follows in the [facility] Disorders center at [facility]. Clinic notes suggest he seems to have worsening of his overall disease trajectory. - Continued home Memantine and Sinemet   PMH: HTN, bradycardia s/p PPM, PD, DBS,  COVID-19 infection, chronic low back pain s/p L4/5 surgery.  PAIN:  Are you having pain? Yes: NPRS scale: 6/10 Pain location: Low back Pain description: ache Aggravating factors: Prolonged standing/sitting, bending over Relieving factors: rest, changing positions   OBJECTIVE: (objective measures completed at initial evaluation unless otherwise dated)     INTERVENTION THIS DATE:  Exercise/Activity Sets/Reps/Time/ Resistance Assistance Charge type Comments  Ambulation with U step walker  X 10 minutes approx 1000 feet   TE Patient consistently drifting to left side of hallway throughout ambulatory task despite cues for trying to stay on right side of hallway  LAQ 2 x 10 5# AW  TE   Marching  2 x 10 5# AW   TE   STS with PWR! Up then PWR! Step in standing  2 x 10   NMR Cues for proper performance each round, unable to process appropriate sequencing for this task.   STS with PWR! Up then PWR! Twist to one side in standing X 10   NMR Cues for proper performance each round, unable to process appropriate sequencing for this task. Cues for proper axial rotation.   SLS training 1 LE airex one on step  2 x 30 sec ea LE   NMR R LE difficulty maintaining extension, a few instances of using UE for support bilaterally throughout   Rows  2 x 15 with grey TB  TE To engage post chain postural musculature   Treatment Provided this session   Rationale for Evaluation and Treatment Rehabilitation  Pt educated throughout session about proper posture and technique with exercises. Improved exercise technique, movement at target joints, use of target muscles after min to mod verbal, visual, tactile cues. Note: Portions of this  document were prepared using Dragon voice recognition software and although reviewed may contain unintentional dictation errors in syntax, grammar, or spelling.    PATIENT EDUCATION: Education details: PT plan of care; purpose of functional outcomes Person educated: Patient and Spouse Education method: Explanation and Verbal cues Education comprehension: verbalized understanding   HOME EXERCISE PROGRAM: Access Code: [email or web address] URL: [email or web address] Date: 04/20/2022 Prepared by: Rivka Barbara  Exercises - Sit to stand, walk with assistive device to another chair, sit and repeat.   - 1 x daily - 7 x weekly - 2 sets - 10 reps - Standing March with Counter Support  - 1 x daily - 7 x weekly - 3 sets - 10 reps - Standing in "bowling stance" with counter support   - 1 x daily - 7 x weekly - 3 sets - 10 reps - Heel Raises with Counter Support  - 1 x daily - 7 x weekly - 3 sets - 10 reps - Seated shoulder row while sitting on stability disc   - 1 x daily - 7 x weekly - 3 sets - 10 reps    GOALS: Goals reviewed with patient? Yes  SHORT TERM GOALS: Target date: 01/11/2022  Pt will be independent with HEP in order to improve strength and balance in order to decrease fall risk and improve function at home and work.  Baseline: Patient reports not performing much in the way of any HEP except walking. 12/27: Having phases of participation and phases of refusal to participate.  Goal status: IN PROGRESS  LONG TERM GOALS: Target date: 02/22/2022  Pt will decrease 5TSTS by at least 5 seconds in order to demonstrate clinically significant improvement in LE strength. Baseline: 02/28/2022: 19.5 sec with UE support  04/13/22:11.4 sec 05/25/22: 11.9  sec  Goal status: MET- continue to monitor for consistent progression   2.  Pt will improve FOTO to target score of 48 to display perceived improvements in ability to complete ADL's.  Baseline: 02/28/22=40 12/27: 45 Goal status: IN  PROGRESS  3.  Pt will decrease TUG to < or = to 25 seconds/decrease in order to demonstrate decreased fall risk. Baseline: 02/28/22= 37.26 sec using upright 4WW 12/27:20.21 sec 05/25/22: 27.1 sec ( does not take brakes off without cues)  Goal status: IN PROGRESS Keep assessing to ensure progress is maintained   4.  Pt will increase 10MWT by at least 0.13 m/s in order to demonstrate clinically significant improvement in community ambulation.   Baseline: 02/28/22= 0.67 m/s using upright 4WW 12/27: .89 m/s  Goal status: MET  5.  Patient will increase six minute walk test distance to >1000 for progression to community ambulator and improve gait ability  Baseline: 02/28/22: 720 feet with up walker 04/13/22: 815 feet with up walker 05/25/22: 877 feet with Up walker  Goal status: IN PROGRESS  6.  Patient will experience 1 fall or less per 2 week period for at least 3 weeks in order to improve his frequency of falls and prevent injury from falls  Baseline:04/13/22: Pt has had 1 fall in the last 2 weeks to his knowledge 05/25/22: 2 falls in last 2 weeks  Goal status: IN PROGRESS  ASSESSMENT:  CLINICAL IMPRESSION: Patient presents to physical therapy this date with progress note due at this time.  All goals were assessed and updated last session and recertification visit in the impression from this is listed in the paragraph below.  Patient does demonstrate impaired mental status today secondary to not having packed assist with his cognition over the past week or so when he has been on vacation.  Patient continues with lower extremity strengthening and endurance as well as with Parkinson's disease specific exercises to assist with his movement, balance, and strength.Pt will continue to benefit from skilled physical therapy intervention to address impairments, improve QOL, and attain therapy goals.   05/25/22 Clinical impression statement from recert   Patient demonstrates significant progress in his lower  extremity strength as well as concurrent reduction in fall risk secondary to strength deficits.  However, patient still is following regularly secondary to hallucinations and attempts to ambulate without his assistive device.  Will continue to work on balance interventions to promote improved balance responses in situations where he has rash such as when he has his hallucination and is rushing to get to a door to check things out.  Patient also makes progress with 6-minute walk test indicating improved endurance and ability to ambulate within the community.Patient's condition has the potential to improve in response to therapy. Maximum improvement is yet to be obtained. The anticipated improvement is attainable and reasonable in a generally predictable time.      OBJECTIVE IMPAIRMENTS Abnormal gait, decreased activity tolerance, decreased balance, decreased cognition, decreased coordination, decreased endurance, decreased mobility, difficulty walking, decreased ROM, decreased strength, decreased safety awareness, hypomobility, impaired perceived functional ability, and pain.   ACTIVITY LIMITATIONS carrying, lifting, bending, standing, squatting, stairs, transfers, bed mobility, bathing, toileting, dressing, self feeding, hygiene/grooming, and caring for others  PARTICIPATION LIMITATIONS: meal prep, cleaning, laundry, medication management, personal finances, driving, shopping, community activity, and yard work  PERSONAL FACTORS Time since onset of injury/illness/exacerbation are also affecting patient's functional outcome.   REHAB POTENTIAL: Fair    CLINICAL DECISION MAKING: Evolving/moderate complexity  EVALUATION COMPLEXITY:  Moderate  PLAN: PT FREQUENCY: 2x/week  PT DURATION: 12 weeks  PLANNED INTERVENTIONS: Therapeutic exercises, Therapeutic activity, Neuromuscular re-education, Balance training, Gait training, Patient/Family education, Self Care, Joint mobilization, Joint manipulation,  Stair training, Vestibular training, Canalith repositioning, DME instructions, Dry Needling, Electrical stimulation, Spinal manipulation, Spinal mobilization, Cryotherapy, Moist heat, Manual therapy, and Re-evaluation  PLAN FOR NEXT SESSION: Core strength, balance training   12:52 PM, 06/06/22 Particia Lather PT ,DPT Physical Therapist- [facility]

## 2022-06-07 NOTE — Therapy (Unsigned)
OUTPATIENT PHYSICAL THERAPY NEURO Treatment    Patient Name: [name] MRN: [id number] DOB:01/06/1951, 72 y.o., male Today's Date: 06/07/2022   PCP: Dr. Elsie Stain REFERRING PROVIDER: Dr. Sharolyn Douglas                    Past Medical History:  Diagnosis Date   Arthritis    Bradycardia    Cancer ([place]) 2013   skin cancer   Depression    ptsd   Dysrhythmia    chronic slow heart rate   GERD (gastroesophageal reflux disease)    Headache(784.0)    tension headaches non recent   History of chicken pox    History of kidney stones    passed   Hypertension    treated with HCTZ   Pacemaker    Parkinson's disease    dx'ed 15 years ago   PTSD (post-traumatic stress disorder)    Shortness of breath dyspnea    Sleep apnea    doesn't use C-pap   Varicose veins    Past Surgical History:  Procedure Laterality Date   CHOLECYSTECTOMY N/A 10/22/2014   Procedure: LAPAROSCOPIC CHOLECYSTECTOMY WITH INTRAOPERATIVE CHOLANGIOGRAM;  Surgeon: Dia Crawford III, MD;  Location: [facility] ORS;  Service: General;  Laterality: N/A;   cyst removed      from lip as a child   INTRAMEDULLARY (IM) NAIL INTERTROCHANTERIC N/A 02/18/2019   Procedure: INTRAMEDULLARY (IM) NAIL INTERTROCHANTRIC, RIGHT,;  Surgeon: Thornton Park, MD;  Location: [facility] ORS;  Service: Orthopedics;  Laterality: N/A;   LUMBAR LAMINECTOMY/DECOMPRESSION MICRODISCECTOMY Bilateral 12/14/2012   Procedure: Bilateral lumbar three-four, four-five decompressive laminotomy/foraminotomy;  Surgeon: Charlie Pitter, MD;  Location: [facility];  Service: Neurosurgery;  Laterality: Bilateral;   PULSE GENERATOR IMPLANT Bilateral 12/13/2013   Procedure: Bilateral implantable pulse generator placement;  Surgeon: Erline Levine, MD;  Location: [facility];  Service: Neurosurgery;  Laterality: Bilateral;  Bilateral implantable pulse generator placement   skin cancer removed     from ears,   12 lft arm  rt leg 15   SUBTHALAMIC STIMULATOR  BATTERY REPLACEMENT Bilateral 07/14/2017   Procedure: BILATERAL IMPLANTED PULSE GENERATOR CHANGE FOR DEEP BRAIN STIMULATOR;  Surgeon: Erline Levine, MD;  Location: [facility];  Service: Neurosurgery;  Laterality: Bilateral;   SUBTHALAMIC STIMULATOR INSERTION Bilateral 12/06/2013   Procedure: SUBTHALAMIC STIMULATOR INSERTION;  Surgeon: Erline Levine, MD;  Location: [facility];  Service: Neurosurgery;  Laterality: Bilateral;  Bilateral deep brain stimulator placement   Patient Active Problem List   Diagnosis Date Noted   Hematuria 10/10/2021   Sinus drainage 12/09/2020   Medicare annual wellness visit, subsequent 11/18/2020   Pacemaker    Dementia associated with Parkinson's disease ([place]) 06/17/2020   Vertigo 02/19/2020   Urine abnormality 02/19/2020   Dysphagia 02/23/2019   Fall at home 10/18/2017   REM behavioral disorder 11/02/2016   Radicular pain in right arm 10/09/2016   GERD (gastroesophageal reflux disease) 07/31/2016   Colon cancer screening 07/23/2015   Gout 02/18/2015   Depression 01/06/2015   Skin lesion 10/02/2014   Dupuytren's contracture 08/20/2014   Cough 07/21/2014   Lumbar stenosis with neurogenic claudication 06/13/2014   S/P deep brain stimulator placement 05/08/2014   Advance care planning 01/19/2014   Parkinson's disease 12/13/2013   PTSD (post-traumatic stress disorder) 06/13/2013   Erectile dysfunction 06/13/2013   HLD (hyperlipidemia) 06/13/2013   Essential hypertension 06/03/2013   Bradycardia by electrocardiogram 06/03/2013   Obstructive sleep apnea 03/12/2013    ONSET DATE: 15 years  ago per chart  REFERRING DIAG: Parkinson's disease  THERAPY DIAG:  No diagnosis found.  Rationale for Evaluation and Treatment Rehabilitation  SUBJECTIVE:                                                                                                                                                                                              SUBJECTIVE STATEMENT: Pt  caregiver reports difficulties with cognition secondary to not having patch that assists with cognition.   Pt accompanied by: [name] (caretaker)  PERTINENT HISTORY:   Date of most recent Admission: 09/25/2021 Date of Discharge: 09/28/2021  Discharge Diagnoses:  Altered mental status Parkinson disease (CMS/HCC) Hallucinations  Per [facility] discharge summary notes PHI : Hospital Course:   72 y.o. male with a history of Parkinson's Disease w/ DBS in place, PD-related dementia, OSA, depression, and seizure disorder who presented for EEG given concern for seizures.  Altered mental status Concern seizure activity Periods of unresponsiveness on the setting of new medication--Nuplazid (pimavanserin) recently started. No events of note on continuous EEG. This was held and he returned to his baseline mental status. Other workup including CT head, CTA h/n without obvious cause for periodic unresponsiveness. Nuplazid was discontinued.   Aspiration, weakness Of note, he had a coughing spell while eating though sounds like was precipitated by sneezing. Did well on RA, seen by SLP without any further needs or concerns.  Parkinson's Disease Parkinson's-related dementia - He follows in the [facility] Disorders center at [facility]. Clinic notes suggest he seems to have worsening of his overall disease trajectory. - Continued home Memantine and Sinemet   PMH: HTN, bradycardia s/p PPM, PD, DBS, COVID-19 infection, chronic low back pain s/p L4/5 surgery.  PAIN:  Are you having pain? Yes: NPRS scale: 6/10 Pain location: Low back Pain description: ache Aggravating factors: Prolonged standing/sitting, bending over Relieving factors: rest, changing positions   OBJECTIVE: (objective measures completed at initial evaluation unless otherwise dated)     INTERVENTION THIS DATE:  Exercise/Activity Sets/Reps/Time/ Resistance Assistance Charge type Comments  Ambulation with U step walker  X 10 minutes approx 1000  feet   TE Patient consistently drifting to left side of hallway throughout ambulatory task despite cues for trying to stay on right side of hallway  LAQ 2 x 10 5# AW  TE   Marching  2 x 10 5# AW   TE   STS with PWR! Up then PWR! Step in standing  2 x 10   NMR Cues for proper performance each round, unable to process appropriate sequencing for this task.   STS with PWR! Up then PWR! Twist  to one side in standing X 10   NMR Cues for proper performance each round, unable to process appropriate sequencing for this task. Cues for proper axial rotation.   SLS training 1 LE airex one on step  2 x 30 sec ea LE   NMR R LE difficulty maintaining extension, a few instances of using UE for support bilaterally throughout   Rows  2 x 15 with grey TB  TE To engage post chain postural musculature   Treatment Provided this session   Rationale for Evaluation and Treatment Rehabilitation  Pt educated throughout session about proper posture and technique with exercises. Improved exercise technique, movement at target joints, use of target muscles after min to mod verbal, visual, tactile cues. Note: Portions of this document were prepared using Dragon voice recognition software and although reviewed may contain unintentional dictation errors in syntax, grammar, or spelling.    PATIENT EDUCATION: Education details: PT plan of care; purpose of functional outcomes Person educated: Patient and Spouse Education method: Explanation and Verbal cues Education comprehension: verbalized understanding   HOME EXERCISE PROGRAM: Access Code: [email or web address] URL: [email or web address] Date: 04/20/2022 Prepared by: Rivka Barbara  Exercises - Sit to stand, walk with assistive device to another chair, sit and repeat.   - 1 x daily - 7 x weekly - 2 sets - 10 reps - Standing March with Counter Support  - 1 x daily - 7 x weekly - 3 sets - 10 reps - Standing in "bowling stance" with counter support   - 1 x daily - 7  x weekly - 3 sets - 10 reps - Heel Raises with Counter Support  - 1 x daily - 7 x weekly - 3 sets - 10 reps - Seated shoulder row while sitting on stability disc   - 1 x daily - 7 x weekly - 3 sets - 10 reps    GOALS: Goals reviewed with patient? Yes  SHORT TERM GOALS: Target date: 01/11/2022  Pt will be independent with HEP in order to improve strength and balance in order to decrease fall risk and improve function at home and work.  Baseline: Patient reports not performing much in the way of any HEP except walking. 12/27: Having phases of participation and phases of refusal to participate.  Goal status: IN PROGRESS  LONG TERM GOALS: Target date: 02/22/2022  Pt will decrease 5TSTS by at least 5 seconds in order to demonstrate clinically significant improvement in LE strength. Baseline: 02/28/2022: 19.5 sec with UE support  04/13/22:11.4 sec 05/25/22: 11.9 sec  Goal status: MET- continue to monitor for consistent progression   2.  Pt will improve FOTO to target score of 48 to display perceived improvements in ability to complete ADL's.  Baseline: 02/28/22=40 12/27: 45 Goal status: IN PROGRESS  3.  Pt will decrease TUG to < or = to 25 seconds/decrease in order to demonstrate decreased fall risk. Baseline: 02/28/22= 37.26 sec using upright 4WW 12/27:20.21 sec 05/25/22: 27.1 sec ( does not take brakes off without cues)  Goal status: IN PROGRESS Keep assessing to ensure progress is maintained   4.  Pt will increase 10MWT by at least 0.13 m/s in order to demonstrate clinically significant improvement in community ambulation.   Baseline: 02/28/22= 0.67 m/s using upright 4WW 12/27: .89 m/s  Goal status: MET  5.  Patient will increase six minute walk test distance to >1000 for progression to community ambulator and improve gait ability  Baseline: 02/28/22: 720 feet with up walker 04/13/22:  815 feet with up walker 05/25/22: 877 feet with Up walker  Goal status: IN PROGRESS  6.  Patient will  experience 1 fall or less per 2 week period for at least 3 weeks in order to improve his frequency of falls and prevent injury from falls  Baseline:04/13/22: Pt has had 1 fall in the last 2 weeks to his knowledge 05/25/22: 2 falls in last 2 weeks  Goal status: IN PROGRESS  ASSESSMENT:  CLINICAL IMPRESSION: Patient presents to physical therapy this date with progress note due at this time.  All goals were assessed and updated last session and recertification visit in the impression from this is listed in the paragraph below.  Patient does demonstrate impaired mental status today secondary to not having packed assist with his cognition over the past week or so when he has been on vacation.  Patient continues with lower extremity strengthening and endurance as well as with Parkinson's disease specific exercises to assist with his movement, balance, and strength.Pt will continue to benefit from skilled physical therapy intervention to address impairments, improve QOL, and attain therapy goals.   05/25/22 Clinical impression statement from recert   Patient demonstrates significant progress in his lower extremity strength as well as concurrent reduction in fall risk secondary to strength deficits.  However, patient still is following regularly secondary to hallucinations and attempts to ambulate without his assistive device.  Will continue to work on balance interventions to promote improved balance responses in situations where he has rash such as when he has his hallucination and is rushing to get to a door to check things out.  Patient also makes progress with 6-minute walk test indicating improved endurance and ability to ambulate within the community.Patient's condition has the potential to improve in response to therapy. Maximum improvement is yet to be obtained. The anticipated improvement is attainable and reasonable in a generally predictable time.      OBJECTIVE IMPAIRMENTS Abnormal gait, decreased  activity tolerance, decreased balance, decreased cognition, decreased coordination, decreased endurance, decreased mobility, difficulty walking, decreased ROM, decreased strength, decreased safety awareness, hypomobility, impaired perceived functional ability, and pain.   ACTIVITY LIMITATIONS carrying, lifting, bending, standing, squatting, stairs, transfers, bed mobility, bathing, toileting, dressing, self feeding, hygiene/grooming, and caring for others  PARTICIPATION LIMITATIONS: meal prep, cleaning, laundry, medication management, personal finances, driving, shopping, community activity, and yard work  PERSONAL FACTORS Time since onset of injury/illness/exacerbation are also affecting patient's functional outcome.   REHAB POTENTIAL: Fair    CLINICAL DECISION MAKING: Evolving/moderate complexity  EVALUATION COMPLEXITY: Moderate  PLAN: PT FREQUENCY: 2x/week  PT DURATION: 12 weeks  PLANNED INTERVENTIONS: Therapeutic exercises, Therapeutic activity, Neuromuscular re-education, Balance training, Gait training, Patient/Family education, Self Care, Joint mobilization, Joint manipulation, Stair training, Vestibular training, Canalith repositioning, DME instructions, Dry Needling, Electrical stimulation, Spinal manipulation, Spinal mobilization, Cryotherapy, Moist heat, Manual therapy, and Re-evaluation  PLAN FOR NEXT SESSION: Core strength, balance training   3:52 PM, 06/07/22 Particia Lather PT ,DPT Physical Therapist- [facility]

## 2022-06-08 ENCOUNTER — Encounter: Payer: Self-pay | Admitting: Physical Therapy

## 2022-06-08 ENCOUNTER — Ambulatory Visit: Payer: Medicare PPO

## 2022-06-08 ENCOUNTER — Ambulatory Visit: Payer: Medicare PPO | Admitting: Physical Therapy

## 2022-06-08 DIAGNOSIS — G20A1 Parkinson's disease without dyskinesia, without mention of fluctuations: Secondary | ICD-10-CM

## 2022-06-08 NOTE — Therapy (Signed)
[facility] at [facility] [place], [place], [address] Phone: [phone]   Fax:  [phone]  Patient Details  Name: [name] MRN: [id number] Date of Birth: July 25, 1950 Referring Provider:  Tonia Ghent, MD  Encounter Date: 06/08/2022  Pt reports significant fatigue this date. Pt prefers to hold off on treatment at this time. PT discussed plan moving forward with patient. Will discuss schedule changes with pt primary caregiver this afternoon. No charge associated with this visit.   Particia Lather, PT 06/08/2022, 12:12 PM  [facility] at [facility] [place], [place], [address] Phone: [phone]   Fax:  [phone]

## 2022-06-13 ENCOUNTER — Ambulatory Visit: Payer: Medicare PPO

## 2022-06-13 DIAGNOSIS — G20A1 Parkinson's disease without dyskinesia, without mention of fluctuations: Secondary | ICD-10-CM

## 2022-06-13 DIAGNOSIS — R2681 Unsteadiness on feet: Secondary | ICD-10-CM | POA: Diagnosis not present

## 2022-06-13 DIAGNOSIS — R269 Unspecified abnormalities of gait and mobility: Secondary | ICD-10-CM

## 2022-06-13 DIAGNOSIS — R278 Other lack of coordination: Secondary | ICD-10-CM | POA: Diagnosis not present

## 2022-06-13 DIAGNOSIS — R296 Repeated falls: Secondary | ICD-10-CM

## 2022-06-13 DIAGNOSIS — R262 Difficulty in walking, not elsewhere classified: Secondary | ICD-10-CM | POA: Diagnosis not present

## 2022-06-13 DIAGNOSIS — M6281 Muscle weakness (generalized): Secondary | ICD-10-CM | POA: Diagnosis not present

## 2022-06-13 NOTE — Therapy (Addendum)
OUTPATIENT PHYSICAL THERAPY TREATMENT   Patient Name: [name] MRN: [id number] DOB:[birth year], 72 y.o., male Today's Date: 06/13/2022   PCP: Dr. Crawford Givens REFERRING PROVIDER: Dr. Si Gaul   PT End of Session - 06/13/22 1153     Visit Number 21    Number of Visits 43    Date for PT Re-Evaluation 08/15/22    Authorization Type [employer] Medicare Choice PPO    Progress Note Due on Visit 30    PT Start Time 1148    PT Stop Time 1218    PT Time Calculation (min) 30 min    Equipment Utilized During Treatment Gait belt    Activity Tolerance Patient tolerated treatment well;No increased pain    Behavior During Therapy WFL for tasks assessed/performed                            Past Medical History:  Diagnosis Date   Arthritis    Bradycardia    Cancer (HCC) 2013   skin cancer   Depression    ptsd   Dysrhythmia    chronic slow heart rate   GERD (gastroesophageal reflux disease)    Headache(784.0)    tension headaches non recent   History of chicken pox    History of kidney stones    passed   Hypertension    treated with HCTZ   Pacemaker    Parkinson's disease    dx'ed 15 years ago   PTSD (post-traumatic stress disorder)    Shortness of breath dyspnea    Sleep apnea    doesn't use C-pap   Varicose veins    Past Surgical History:  Procedure Laterality Date   CHOLECYSTECTOMY N/A 10/22/2014   Procedure: LAPAROSCOPIC CHOLECYSTECTOMY WITH INTRAOPERATIVE CHOLANGIOGRAM;  Surgeon: Tiney Rouge III, MD;  Location: [facility]S;  Service: General;  Laterality: N/A;   cyst removed      from lip as a child   INTRAMEDULLARY (IM) NAIL INTERTROCHANTERIC N/A 02/18/2019   Procedure: INTRAMEDULLARY (IM) NAIL INTERTROCHANTRIC, RIGHT,;  Surgeon: Juanell Fairly, MD;  Location: [facility]S;  Service: Orthopedics;  Laterality: N/A;   LUMBAR LAMINECTOMY/DECOMPRESSION MICRODISCECTOMY Bilateral 12/14/2012   Procedure: Bilateral lumbar three-four, four-five decompressive  laminotomy/foraminotomy;  Surgeon: Temple Pacini, MD;  Location: [facility];  Service: Neurosurgery;  Laterality: Bilateral;   PULSE GENERATOR IMPLANT Bilateral 12/13/2013   Procedure: Bilateral implantable pulse generator placement;  Surgeon: Maeola Harman, MD;  Location: [facility];  Service: Neurosurgery;  Laterality: Bilateral;  Bilateral implantable pulse generator placement   skin cancer removed     from ears,   12 lft arm  rt leg 15   SUBTHALAMIC STIMULATOR BATTERY REPLACEMENT Bilateral 07/14/2017   Procedure: BILATERAL IMPLANTED PULSE GENERATOR CHANGE FOR DEEP BRAIN STIMULATOR;  Surgeon: Maeola Harman, MD;  Location: [facility];  Service: Neurosurgery;  Laterality: Bilateral;   SUBTHALAMIC STIMULATOR INSERTION Bilateral 12/06/2013   Procedure: SUBTHALAMIC STIMULATOR INSERTION;  Surgeon: Maeola Harman, MD;  Location: [facility];  Service: Neurosurgery;  Laterality: Bilateral;  Bilateral deep brain stimulator placement   Patient Active Problem List   Diagnosis Date Noted   Hematuria 10/10/2021   Sinus drainage 12/09/2020   Medicare annual wellness visit, subsequent 11/18/2020   Pacemaker    Dementia associated with Parkinson's disease (HCC) 06/17/2020   Vertigo 02/19/2020   Urine abnormality 02/19/2020   Dysphagia 02/23/2019   Fall at home 10/18/2017   REM behavioral disorder 11/02/2016   Radicular  pain in right arm 10/09/2016   GERD (gastroesophageal reflux disease) 07/31/2016   Colon cancer screening 07/23/2015   Gout 02/18/2015   Depression 01/06/2015   Skin lesion 10/02/2014   Dupuytren's contracture 08/20/2014   Cough 07/21/2014   Lumbar stenosis with neurogenic claudication 06/13/2014   S/P deep brain stimulator placement 05/08/2014   Advance care planning 01/19/2014   Parkinson's disease 12/13/2013   PTSD (post-traumatic stress disorder) 06/13/2013   Erectile dysfunction 06/13/2013   HLD (hyperlipidemia) 06/13/2013   Essential hypertension 06/03/2013   Bradycardia by  electrocardiogram 06/03/2013   Obstructive sleep apnea 03/12/2013    ONSET DATE: 15 years ago per chart  REFERRING DIAG: Parkinson's disease  THERAPY DIAG:  Parkinson's disease without dyskinesia or fluctuating manifestations  Unsteadiness on feet  Repeated falls  Abnormality of gait and mobility  Difficulty in walking, not elsewhere classified  Rationale for Evaluation and Treatment Rehabilitation  SUBJECTIVE:                                                                                                                                                                                              SUBJECTIVE STATEMENT: Fell at church yesterday, no recollection, found down in bathroom.    Pt accompanied by: Seward Grater (caretaker)  PERTINENT HISTORY:   Date of most recent Admission: 09/25/2021 Date of Discharge: 09/28/2021  Discharge Diagnoses:  Altered mental status Parkinson disease (CMS/HCC) Hallucinations  Per [facility] discharge summary notes PHI : Hospital Course:   72 y.o. male with a history of Parkinson's Disease w/ DBS in place, PD-related dementia, OSA, depression, and seizure disorder who presented for EEG given concern for seizures.  Altered mental status Concern seizure activity Periods of unresponsiveness on the setting of new medication--Nuplazid (pimavanserin) recently started. No events of note on continuous EEG. This was held and he returned to his baseline mental status. Other workup including CT head, CTA h/n without obvious cause for periodic unresponsiveness. Nuplazid was discontinued.   Aspiration, weakness Of note, he had a coughing spell while eating though sounds like was precipitated by sneezing. Did well on RA, seen by SLP without any further needs or concerns.  Parkinson's Disease Parkinson's-related dementia - He follows in the Movement Disorders center at [facility]. Clinic notes suggest he seems to have worsening of his overall disease trajectory. -  Continued home Memantine and Sinemet   PMH: HTN, bradycardia s/p PPM, PD, DBS, COVID-19 infection, chronic low back pain s/p L4/5 surgery.  PAIN:  Are you having pain? Yes: NPRS scale: 6/10 Pain location: Low back Pain description: ache Aggravating factors: Prolonged standing/sitting, bending over Relieving factors: rest, changing  positions   OBJECTIVE: (objective measures completed at initial evaluation unless otherwise dated)     INTERVENTION THIS DATE:  -seated BP assessment, standing BP assessment  -10x STS from chair (worse trouble with retropulsion)  *seated break  -10x STS from chair (worse trouble with retropulsion) *seated break  -AMB overground c high Ustep walker 374ft minGuard assist, loving speed final 50ft   -seated LAQ 1x12 5lb AW bilat  -seated marching 1x12 bilat 5lb AW  -seated deadlift orange ball from foam pad to chest, overhead press, x10 -seated LAQ 1x12 5lb AW bilat  -seated marching 1x12 bilat 5lb AW  -seated deadlift orange ball from foam pad to chest, overhead press, x10   PATIENT EDUCATION: Education details: PT plan of care; purpose of functional outcomes Person educated: Patient and Spouse Education method: Explanation and Verbal cues Education comprehension: verbalized understanding   HOME EXERCISE PROGRAM: Access Code: [email or web address] URL: [email or web address] Date: 04/20/2022 Prepared by: Thresa Ross  Exercises - Sit to stand, walk with assistive device to another chair, sit and repeat.   - 1 x daily - 7 x weekly - 2 sets - 10 reps - Standing March with Counter Support  - 1 x daily - 7 x weekly - 3 sets - 10 reps - Standing in "bowling stance" with counter support   - 1 x daily - 7 x weekly - 3 sets - 10 reps - Heel Raises with Counter Support  - 1 x daily - 7 x weekly - 3 sets - 10 reps - Seated shoulder row while sitting on stability disc   - 1 x daily - 7 x weekly - 3 sets - 10 reps    GOALS: Goals reviewed with  patient? Yes  SHORT TERM GOALS: Target date: 01/11/2022  Pt will be independent with HEP in order to improve strength and balance in order to decrease fall risk and improve function at home and work.  Baseline: Patient reports not performing much in the way of any HEP except walking. 12/27: Having phases of participation and phases of refusal to participate.  Goal status: IN PROGRESS  LONG TERM GOALS: Target date: 02/22/2022  Pt will decrease 5TSTS by at least 5 seconds in order to demonstrate clinically significant improvement in LE strength. Baseline: 02/28/2022: 19.5 sec with UE support  04/13/22:11.4 sec 05/25/22: 11.9 sec  Goal status: MET- continue to monitor for consistent progression   2.  Pt will improve FOTO to target score of 48 to display perceived improvements in ability to complete ADL's.  Baseline: 02/28/22=40 12/27: 45 Goal status: IN PROGRESS  3.  Pt will decrease TUG to < or = to 25 seconds/decrease in order to demonstrate decreased fall risk. Baseline: 02/28/22= 37.26 sec using upright 4WW 12/27:20.21 sec 05/25/22: 27.1 sec ( does not take brakes off without cues)  Goal status: IN PROGRESS Keep assessing to ensure progress is maintained   4.  Pt will increase by at least 0.13 m/s in order to demonstrate clinically significant improvement in community ambulation.   Baseline: 02/28/22= 0.67 m/s using upright 4WW 12/27: .89 m/s  Goal status: MET  5.  Patient will increase six minute walk test distance to >1000 for progression to community ambulator and improve gait ability  Baseline: 02/28/22: 720 feet with up walker 04/13/22: 815 feet with up walker 05/25/22: 877 feet with Up walker  Goal status: IN PROGRESS  6.  Patient will experience 1 fall or less per 2 week period for at least 3 weeks  in order to improve his frequency of falls and prevent injury from falls  Baseline:04/13/22: Pt has had 1 fall in the last 2 weeks to his knowledge 05/25/22: 2 falls in last 2 weeks   Goal status: IN PROGRESS  ASSESSMENT:  CLINICAL IMPRESSION: Pt continued to recover energy levels still fatigued somewhat since last week. Pt remains motivated overall, tolerates interventions generally well, but more fatigued than typical. Balance more limited this session, Several posterior LOB while performing STS exercise. Pt requires repeat cues for 2 phase exercise for correct sequencing. Will continue to benefit from skilled PT going forward.     OBJECTIVE IMPAIRMENTS Abnormal gait, decreased activity tolerance, decreased balance, decreased cognition, decreased coordination, decreased endurance, decreased mobility, difficulty walking, decreased ROM, decreased strength, decreased safety awareness, hypomobility, impaired perceived functional ability, and pain.   ACTIVITY LIMITATIONS carrying, lifting, bending, standing, squatting, stairs, transfers, bed mobility, bathing, toileting, dressing, self feeding, hygiene/grooming, and caring for others  PARTICIPATION LIMITATIONS: meal prep, cleaning, laundry, medication management, personal finances, driving, shopping, community activity, and yard work  PERSONAL FACTORS Time since onset of injury/illness/exacerbation are also affecting patient's functional outcome.   REHAB POTENTIAL: Fair    CLINICAL DECISION MAKING: Evolving/moderate complexity  EVALUATION COMPLEXITY: Moderate  PLAN: PT FREQUENCY: 2x/week  PT DURATION: 12 weeks  PLANNED INTERVENTIONS: Therapeutic exercises, Therapeutic activity, Neuromuscular re-education, Balance training, Gait training, Patient/Family education, Self Care, Joint mobilization, Joint manipulation, Stair training, Vestibular training, Canalith repositioning, DME instructions, Dry Needling, Electrical stimulation, Spinal manipulation, Spinal mobilization, Cryotherapy, Moist heat, Manual therapy, and Re-evaluation  PLAN FOR NEXT SESSION: Core strength, balance training   11:58 AM, 06/13/22 Rosamaria Lints PT ,DPT Physical Therapist- [place]  [facility]

## 2022-06-14 ENCOUNTER — Ambulatory Visit (INDEPENDENT_AMBULATORY_CARE_PROVIDER_SITE_OTHER): Payer: Medicare PPO | Admitting: Psychology

## 2022-06-14 DIAGNOSIS — F331 Major depressive disorder, recurrent, moderate: Secondary | ICD-10-CM | POA: Diagnosis not present

## 2022-06-14 DIAGNOSIS — F4323 Adjustment disorder with mixed anxiety and depressed mood: Secondary | ICD-10-CM | POA: Diagnosis not present

## 2022-06-15 ENCOUNTER — Encounter: Payer: Self-pay | Admitting: Physical Therapy

## 2022-06-15 ENCOUNTER — Ambulatory Visit: Payer: Medicare PPO

## 2022-06-15 ENCOUNTER — Ambulatory Visit: Payer: Medicare PPO | Admitting: Physical Therapy

## 2022-06-15 DIAGNOSIS — R269 Unspecified abnormalities of gait and mobility: Secondary | ICD-10-CM

## 2022-06-15 DIAGNOSIS — M6281 Muscle weakness (generalized): Secondary | ICD-10-CM | POA: Diagnosis not present

## 2022-06-15 DIAGNOSIS — R2681 Unsteadiness on feet: Secondary | ICD-10-CM

## 2022-06-15 DIAGNOSIS — R278 Other lack of coordination: Secondary | ICD-10-CM | POA: Diagnosis not present

## 2022-06-15 DIAGNOSIS — G20A1 Parkinson's disease without dyskinesia, without mention of fluctuations: Secondary | ICD-10-CM | POA: Diagnosis not present

## 2022-06-15 DIAGNOSIS — R296 Repeated falls: Secondary | ICD-10-CM | POA: Diagnosis not present

## 2022-06-15 DIAGNOSIS — R262 Difficulty in walking, not elsewhere classified: Secondary | ICD-10-CM | POA: Diagnosis not present

## 2022-06-15 NOTE — Therapy (Signed)
OUTPATIENT PHYSICAL THERAPY NEURO Treatment      Patient Name: [name] MRN: [id number] DOB:07/18/1950, 72 y.o., male Today's Date: 06/15/2022   PCP: Dr. Elsie Stain REFERRING PROVIDER: Dr. Sharolyn Douglas   PT End of Session - 06/15/22 1300     Visit Number 22    Number of Visits 43    Date for PT Re-Evaluation 08/15/22    Authorization Type [employer] Medicare Choice PPO    Progress Note Due on Visit 30    PT Start Time 1148    PT Stop Time 1226    PT Time Calculation (min) 38 min    Equipment Utilized During Treatment Gait belt    Activity Tolerance Patient tolerated treatment well;No increased pain    Behavior During Therapy WFL for tasks assessed/performed                             Past Medical History:  Diagnosis Date   Arthritis    Bradycardia    Cancer ([place]) 2013   skin cancer   Depression    ptsd   Dysrhythmia    chronic slow heart rate   GERD (gastroesophageal reflux disease)    Headache(784.0)    tension headaches non recent   History of chicken pox    History of kidney stones    passed   Hypertension    treated with HCTZ   Pacemaker    Parkinson's disease    dx'ed 15 years ago   PTSD (post-traumatic stress disorder)    Shortness of breath dyspnea    Sleep apnea    doesn't use C-pap   Varicose veins    Past Surgical History:  Procedure Laterality Date   CHOLECYSTECTOMY N/A 10/22/2014   Procedure: LAPAROSCOPIC CHOLECYSTECTOMY WITH INTRAOPERATIVE CHOLANGIOGRAM;  Surgeon: Dia Crawford III, MD;  Location: [facility] ORS;  Service: General;  Laterality: N/A;   cyst removed      from lip as a child   INTRAMEDULLARY (IM) NAIL INTERTROCHANTERIC N/A 02/18/2019   Procedure: INTRAMEDULLARY (IM) NAIL INTERTROCHANTRIC, RIGHT,;  Surgeon: Thornton Park, MD;  Location: [facility] ORS;  Service: Orthopedics;  Laterality: N/A;   LUMBAR LAMINECTOMY/DECOMPRESSION MICRODISCECTOMY Bilateral 12/14/2012   Procedure: Bilateral lumbar three-four, four-five  decompressive laminotomy/foraminotomy;  Surgeon: Charlie Pitter, MD;  Location: [place] [facility];  Service: Neurosurgery;  Laterality: Bilateral;   PULSE GENERATOR IMPLANT Bilateral 12/13/2013   Procedure: Bilateral implantable pulse generator placement;  Surgeon: Erline Levine, MD;  Location: [place] [facility];  Service: Neurosurgery;  Laterality: Bilateral;  Bilateral implantable pulse generator placement   skin cancer removed     from ears,   12 lft arm  rt leg 15   SUBTHALAMIC STIMULATOR BATTERY REPLACEMENT Bilateral 07/14/2017   Procedure: BILATERAL IMPLANTED PULSE GENERATOR CHANGE FOR DEEP BRAIN STIMULATOR;  Surgeon: Erline Levine, MD;  Location: [place];  Service: Neurosurgery;  Laterality: Bilateral;   SUBTHALAMIC STIMULATOR INSERTION Bilateral 12/06/2013   Procedure: SUBTHALAMIC STIMULATOR INSERTION;  Surgeon: Erline Levine, MD;  Location: [place] [facility];  Service: Neurosurgery;  Laterality: Bilateral;  Bilateral deep brain stimulator placement   Patient Active Problem List   Diagnosis Date Noted   Hematuria 10/10/2021   Sinus drainage 12/09/2020   Medicare annual wellness visit, subsequent 11/18/2020   Pacemaker    Dementia associated with Parkinson's disease ([place]) 06/17/2020   Vertigo 02/19/2020   Urine abnormality 02/19/2020   Dysphagia 02/23/2019   Fall at home 10/18/2017   REM behavioral  disorder 11/02/2016   Radicular pain in right arm 10/09/2016   GERD (gastroesophageal reflux disease) 07/31/2016   Colon cancer screening 07/23/2015   Gout 02/18/2015   Depression 01/06/2015   Skin lesion 10/02/2014   Dupuytren's contracture 08/20/2014   Cough 07/21/2014   Lumbar stenosis with neurogenic claudication 06/13/2014   S/P deep brain stimulator placement 05/08/2014   Advance care planning 01/19/2014   Parkinson's disease 12/13/2013   PTSD (post-traumatic stress disorder) 06/13/2013   Erectile dysfunction 06/13/2013   HLD (hyperlipidemia) 06/13/2013   Essential hypertension 06/03/2013    Bradycardia by electrocardiogram 06/03/2013   Obstructive sleep apnea 03/12/2013    ONSET DATE: 15 years ago per chart  REFERRING DIAG: Parkinson's disease  THERAPY DIAG:  Abnormality of gait and mobility  Unsteadiness on feet  Repeated falls  Difficulty in walking, not elsewhere classified  Rationale for Evaluation and Treatment Rehabilitation  SUBJECTIVE:                                                                                                                                                                                              SUBJECTIVE STATEMENT: Fell at home Monday. No significant changes since last session.   Pt accompanied by: [name] (caretaker)  PERTINENT HISTORY:   Date of most recent Admission: 09/25/2021 Date of Discharge: 09/28/2021  Discharge Diagnoses:  Altered mental status Parkinson disease (CMS/HCC) Hallucinations  Per [facility] discharge summary notes PHI : Hospital Course:   72 y.o. male with a history of Parkinson's Disease w/ DBS in place, PD-related dementia, OSA, depression, and seizure disorder who presented for EEG given concern for seizures.  Altered mental status Concern seizure activity Periods of unresponsiveness on the setting of new medication--Nuplazid (pimavanserin) recently started. No events of note on continuous EEG. This was held and he returned to his baseline mental status. Other workup including CT head, CTA h/n without obvious cause for periodic unresponsiveness. Nuplazid was discontinued.   Aspiration, weakness Of note, he had a coughing spell while eating though sounds like was precipitated by sneezing. Did well on RA, seen by SLP without any further needs or concerns.  Parkinson's Disease Parkinson's-related dementia - He follows in the Movement Disorders center at [facility]. Clinic notes suggest he seems to have worsening of his overall disease trajectory. - Continued home Memantine and Sinemet   PMH: HTN,  bradycardia s/p PPM, PD, DBS, COVID-19 infection, chronic low back pain s/p L4/5 surgery.  PAIN:  Are you having pain? Yes: NPRS scale: 6/10 Pain location: Low back Pain description: ache Aggravating factors: Prolonged standing/sitting, bending over Relieving factors: rest, changing positions   OBJECTIVE: (  objective measures completed at initial evaluation unless otherwise dated)     INTERVENTION THIS DATE:  TA:  PWR! Up x 10 reps   PWR! Up with PWR! Step working on dual task and functional movements like reaching for walker or walking aid.   Ambulation through EVS hallway with upWalker. Pt still has list to the right. Improved some with external cues.   TE: -seated LAQ 1x12 5lb AW bilat  -seated marching 1x12 bilat 5lb AW   -seated LAQ 1x12 5lb AW bilat  -seated marching 1x12 bilat 5lb AW   TA 3 x 45 sec holds with one LE on airex and 1 on 6 inch step.  - complete on ea side  -increased unsteadiness and confusion with activity this date. Pt was due for medication during session which could have been limiting for his participation and motor control.     PATIENT EDUCATION: Education details: PT plan of care; purpose of functional outcomes Person educated: Patient and Spouse Education method: Explanation and Verbal cues Education comprehension: verbalized understanding   HOME EXERCISE PROGRAM: Access Code: [email or web address] URL: [email or web address] Date: 04/20/2022 Prepared by: Rivka Barbara  Exercises - Sit to stand, walk with assistive device to another chair, sit and repeat.   - 1 x daily - 7 x weekly - 2 sets - 10 reps - Standing March with Counter Support  - 1 x daily - 7 x weekly - 3 sets - 10 reps - Standing in "bowling stance" with counter support   - 1 x daily - 7 x weekly - 3 sets - 10 reps - Heel Raises with Counter Support  - 1 x daily - 7 x weekly - 3 sets - 10 reps - Seated shoulder row while sitting on stability disc   - 1 x daily - 7 x  weekly - 3 sets - 10 reps    GOALS: Goals reviewed with patient? Yes  SHORT TERM GOALS: Target date: 01/11/2022  Pt will be independent with HEP in order to improve strength and balance in order to decrease fall risk and improve function at home and work.  Baseline: Patient reports not performing much in the way of any HEP except walking. 12/27: Having phases of participation and phases of refusal to participate.  Goal status: IN PROGRESS  LONG TERM GOALS: Target date: 02/22/2022  Pt will decrease 5TSTS by at least 5 seconds in order to demonstrate clinically significant improvement in LE strength. Baseline: 02/28/2022: 19.5 sec with UE support  04/13/22:11.4 sec 05/25/22: 11.9 sec  Goal status: MET- continue to monitor for consistent progression   2.  Pt will improve FOTO to target score of 48 to display perceived improvements in ability to complete ADL's.  Baseline: 02/28/22=40 12/27: 45 Goal status: IN PROGRESS  3.  Pt will decrease TUG to < or = to 25 seconds/decrease in order to demonstrate decreased fall risk. Baseline: 02/28/22= 37.26 sec using upright 4WW 12/27:20.21 sec 05/25/22: 27.1 sec ( does not take brakes off without cues)  Goal status: IN PROGRESS Keep assessing to ensure progress is maintained   4.  Pt will increase 10MWT by at least 0.13 m/s in order to demonstrate clinically significant improvement in community ambulation.   Baseline: 02/28/22= 0.67 m/s using upright 4WW 12/27: .89 m/s  Goal status: MET  5.  Patient will increase six minute walk test distance to >1000 for progression to community ambulator and improve gait ability  Baseline: 02/28/22: 720 feet with up walker 04/13/22: 815 feet with  up walker 05/25/22: 877 feet with Up walker  Goal status: IN PROGRESS  6.  Patient will experience 1 fall or less per 2 week period for at least 3 weeks in order to improve his frequency of falls and prevent injury from falls  Baseline:04/13/22: Pt has had 1 fall in the last  2 weeks to his knowledge 05/25/22: 2 falls in last 2 weeks  Goal status: IN PROGRESS  ASSESSMENT:  CLINICAL IMPRESSION: Pt shows fair motivation for completion of PT activities. With ambulation pt still showing list to the right despite cues and feedback of performance. Pt remains motivated overall, tolerates interventions generally well, but more fatigued than typical. Balance more limited this session with activities he was previously able to complete with less effort. Pt will continue to benefit from skilled physical therapy intervention to address impairments, improve QOL, and attain therapy goals.      OBJECTIVE IMPAIRMENTS Abnormal gait, decreased activity tolerance, decreased balance, decreased cognition, decreased coordination, decreased endurance, decreased mobility, difficulty walking, decreased ROM, decreased strength, decreased safety awareness, hypomobility, impaired perceived functional ability, and pain.   ACTIVITY LIMITATIONS carrying, lifting, bending, standing, squatting, stairs, transfers, bed mobility, bathing, toileting, dressing, self feeding, hygiene/grooming, and caring for others  PARTICIPATION LIMITATIONS: meal prep, cleaning, laundry, medication management, personal finances, driving, shopping, community activity, and yard work  PERSONAL FACTORS Time since onset of injury/illness/exacerbation are also affecting patient's functional outcome.   REHAB POTENTIAL: Fair    CLINICAL DECISION MAKING: Evolving/moderate complexity  EVALUATION COMPLEXITY: Moderate  PLAN: PT FREQUENCY: 2x/week  PT DURATION: 12 weeks  PLANNED INTERVENTIONS: Therapeutic exercises, Therapeutic activity, Neuromuscular re-education, Balance training, Gait training, Patient/Family education, Self Care, Joint mobilization, Joint manipulation, Stair training, Vestibular training, Canalith repositioning, DME instructions, Dry Needling, Electrical stimulation, Spinal manipulation, Spinal  mobilization, Cryotherapy, Moist heat, Manual therapy, and Re-evaluation  PLAN FOR NEXT SESSION: Core strength, balance training   1:03 PM, 06/15/22 Particia Lather PT ,DPT Physical Therapist- [facility]

## 2022-06-15 NOTE — Progress Notes (Signed)
[facility] Counselor/Therapist Progress Note  Patient ID: [name], MRN: [id number],    Date: 06/15/2022  Time Spent: 45 minutes  Treatment Type: Indivdual  Reported Symptoms: sadness, and boredom  Mental Status Exam: Appearance:  Casual     Behavior: normal  Motor: Psychomotor Retardation  Speech/Language:  Slow  Affect: flat  Mood: normal  Thought process: concrete  Thought content:   WNL  Sensory/Perceptual disturbances:   WNL  Orientation: oriented to person, place, time/date, and situation  Attention: Fair  Concentration: Fair  Memory: Immediate;   Hallam of knowledge:  Fair  Insight:   Fair  Judgment:  Fair  Impulse Control: Fair   Risk Assessment: Danger to Self:  No Self-injurious Behavior: No Danger to Others: No Duty to Warn:no Physical Aggression / Violence:No  Access to Firearms a concern: No  Gang Involvement:No   Subjective: The patient attended a face-to-face individual therapy session  via video visit today.  The patient gave verbal consent for the video to be on WebEx.  The patient was in his home  and therapist was in the office.  The patient presents with a pleasant affect and mood is pleasant.  The patient reports that he had a nice time at the beach.  His granddaughters who went apparently got into an argument there.  The patient seems to be in better spirits today and able to interact more.  The patient's wife bought him a new Production designer, theatre/television/film and she was telling me about it.  He seems to be okay with it.  We talked about him doing some things to learn how to play the piano and also get back into writing songs as much as possible.  The patient seems to be in a better mood and likes to continue therapy to have someone to check in with.   Interventions: Ego-Supportive, CBT  Diagnosis:Major depressive disorder, recurrent episode, moderate (HCC)  Adjustment disorder with mixed anxiety and depressed mood  Plan: Client  Abilities/Strengths  Pt is bright and has a good sense of humor.  Client Treatment Preferences  Individual therapy.  Client Statement of Needs  improve coping skills regarding chronic illness.  Treatment Level  Outpatient Individual therapy  Symptoms  Depressed or irritable mood.: No Description Entered (Status: improved). Diminished interest in or  enjoyment of activities.: No Description Entered (Status: maintained). Feelings of hopelessness,  worthlessness, or inappropriate guilt.: No Description Entered (Status: improved). Lack of energy.: No  Description Entered (Status: maintained). Psychomotor agitation or retardation.: No Description  Entered (Status: maintained). Social withdrawal.: No Description Entered (Status: improved).  Problems Addressed  Unipolar Depression, Unipolar Depression, Unipolar Depression, Unipolar Depression  Goals 1. Alleviate depressive symptoms and return to previous level of effective  functioning. 2. Develop healthy interpersonal relationships that lead to the alleviation  and help prevent the relapse of depression. Objective Describe current and past experiences with depression including their impact on functioning and  attempts to resolve it. Target Date: 2023-05-01 Frequency: Monthly Progress: 60 Modality: individual Related Interventions 1. Encourage the client to share his/her thoughts and feelings of depression; express empathy and  build rapport while identifying primary cognitive, behavioral, interpersonal, or other  contributors to depression. Objective Learn and implement behavioral strategies to overcome depression. Target Date: 2023-05-01 Frequency: Monthly Progress: 80 Modality: individual Objective Identify and replace thoughts and beliefs that support depression. Target Date: 2023-05-01 Frequency: Monthly Progress: 70 Modality: individual Related Interventions 1. Conduct Cognitive-Behavioral Therapy (see Cognitive Behavior Therapy  by Olevia Bowens; Overcoming Depression by Lynita Lombard al.), beginning with helping the client learn the connection among  cognition, depressive feelings, and actions. 2. Explore and restructure underlying assumptions and beliefs reflected in biased self-talk that  may put the client at risk for relapse or recurrence. 3. Facilitate and reinforce the client's shift from biased depressive self-talk and beliefs to realitybased cognitive messages that enhance self-confidence and increase adaptive actions (see  "Positive Self-Talk" in the Adult Psychotherapy Homework Planner by Bryn Gulling). Objective Learn and implement problem-solving and decision-making skills. Target Date: 2023-05-01 Frequency: Monthly Progress: 70 Modality: individual Related Interventions 1. Encourage in the client the development of a positive problem orientation in which problems  and solving them are viewed as a natural part of life and not something to be feared, despaired,  or avoided. 2. Conduct Problem-Solving Therapy (see Problem-Solving Therapy by Shawnee Knapp and Delane Ginger) using techniques such as psychoeducation, modeling, and role-playing to teach client problem-solving skills (i.e., defining a problem specifically, generating possible solutions, evaluating the pros  and cons of each solution, selecting and implementing a plan of action, evaluating the efficacy  of the plan, accepting or revising the plan); role-play application of the problem-solving skill to  a real life issue (or assign "Applying Problem-Solving to Interpersonal Conflict" in the Adult  Psychotherapy Homework Planner by Bryn Gulling). 3. Develop healthy thinking patterns and beliefs about self, others, and the world that lead to the alleviation and help prevent the relapse of  depression. 4. Recognize, accept, and cope with feelings of depression. Diagnosis Axis  none F43.21 (Adjustment Disorder, With depressed mood) - Open -  [Signifier: n/a]  Adjustment  Disorder,  With Depressed Mood  Axis  none 296.32 (Major depressive affective disorder, recurrent episode,  moderate) - Open - [Signifier: n/a]  Conditions For Discharge Achievement of treatment goals and objectives   Plan is to continue to offer supportive therapy to patient to help him deal with his depression related to his Parkinsons diagnosis.  Patient approved Treatment Plan   Ji Fairburn Rejeana Brock, LCSW

## 2022-06-20 ENCOUNTER — Ambulatory Visit: Payer: Medicare PPO | Attending: Family Medicine | Admitting: Physical Therapy

## 2022-06-20 ENCOUNTER — Ambulatory Visit: Payer: Medicare PPO

## 2022-06-20 DIAGNOSIS — R2681 Unsteadiness on feet: Secondary | ICD-10-CM | POA: Diagnosis not present

## 2022-06-20 DIAGNOSIS — R269 Unspecified abnormalities of gait and mobility: Secondary | ICD-10-CM | POA: Diagnosis not present

## 2022-06-20 DIAGNOSIS — G20A1 Parkinson's disease without dyskinesia, without mention of fluctuations: Secondary | ICD-10-CM | POA: Diagnosis not present

## 2022-06-20 DIAGNOSIS — R296 Repeated falls: Secondary | ICD-10-CM | POA: Insufficient documentation

## 2022-06-20 DIAGNOSIS — R262 Difficulty in walking, not elsewhere classified: Secondary | ICD-10-CM | POA: Diagnosis not present

## 2022-06-20 NOTE — Therapy (Signed)
OUTPATIENT PHYSICAL THERAPY NEURO Treatment      Patient Name: [name] MRN: [id number] DOB:[birth year], 72 y.o., male Today's Date: 06/20/2022   PCP: Dr. Elsie Mcgee REFERRING PROVIDER: Dr. Sharolyn Mcgee   PT End of Session - 06/20/22 1142     Visit Number 23    Number of Visits 28    Date for PT Re-Evaluation 08/15/22    Authorization Type [employer] Medicare Choice PPO    Progress Note Due on Visit 30    PT Start Time 1139    PT Stop Time 1220    PT Time Calculation (min) 41 min    Equipment Utilized During Treatment Gait belt    Activity Tolerance Patient tolerated treatment well;No increased pain    Behavior During Therapy WFL for tasks assessed/performed                              Past Medical History:  Diagnosis Date   Arthritis    Bradycardia    Cancer ([place]) 2013   skin cancer   Depression    ptsd   Dysrhythmia    chronic slow heart rate   GERD (gastroesophageal reflux disease)    Headache(784.0)    tension headaches non recent   History of chicken pox    History of kidney stones    passed   Hypertension    treated with HCTZ   Pacemaker    Parkinson's disease    dx'ed 15 years ago   PTSD (post-traumatic stress disorder)    Shortness of breath dyspnea    Sleep apnea    doesn't use C-pap   Varicose veins    Past Surgical History:  Procedure Laterality Date   CHOLECYSTECTOMY N/A 10/22/2014   Procedure: LAPAROSCOPIC CHOLECYSTECTOMY WITH INTRAOPERATIVE CHOLANGIOGRAM;  Surgeon: George Crawford III, MD;  Location: [facility] ORS;  Service: General;  Laterality: N/A;   cyst removed      from lip as a child   INTRAMEDULLARY (IM) NAIL INTERTROCHANTERIC N/A 02/18/2019   Procedure: INTRAMEDULLARY (IM) NAIL INTERTROCHANTRIC, RIGHT,;  Surgeon: George Park, MD;  Location: [facility] ORS;  Service: Orthopedics;  Laterality: N/A;   LUMBAR LAMINECTOMY/DECOMPRESSION MICRODISCECTOMY Bilateral 12/14/2012   Procedure: Bilateral lumbar three-four, four-five  decompressive laminotomy/foraminotomy;  Surgeon: George Pitter, MD;  Location: [place];  Service: Neurosurgery;  Laterality: Bilateral;   PULSE GENERATOR IMPLANT Bilateral 12/13/2013   Procedure: Bilateral implantable pulse generator placement;  Surgeon: George Levine, MD;  Location: [place] [facility]UR[facility];  Service: Neurosurgery;  Laterality: Bilateral;  Bilateral implantable pulse generator placement   skin cancer removed     from ears,   12 lft arm  rt leg 15   SUBTHALAMIC STIMULATOR BATTERY REPLACEMENT Bilateral 07/14/2017   Procedure: BILATERAL IMPLANTED PULSE GENERATOR CHANGE FOR DEEP BRAIN STIMULATOR;  Surgeon: George Levine, MD;  Location: [facility];  Service: Neurosurgery;  Laterality: Bilateral;   SUBTHALAMIC STIMULATOR INSERTION Bilateral 12/06/2013   Procedure: SUBTHALAMIC STIMULATOR INSERTION;  Surgeon: George Levine, MD;  Location: [facility] [facility];  Service: Neurosurgery;  Laterality: Bilateral;  Bilateral deep brain stimulator placement   Patient Active Problem List   Diagnosis Date Noted   Hematuria 10/10/2021   Sinus drainage 12/09/2020   Medicare annual wellness visit, subsequent 11/18/2020   Pacemaker    Dementia associated with Parkinson's disease ([place]) 06/17/2020   Vertigo 02/19/2020   Urine abnormality 02/19/2020   Dysphagia 02/23/2019   Fall at home 10/18/2017   REM  behavioral disorder 11/02/2016   Radicular pain in right arm 10/09/2016   GERD (gastroesophageal reflux disease) 07/31/2016   Colon cancer screening 07/23/2015   Gout 02/18/2015   Depression 01/06/2015   Skin lesion 10/02/2014   Dupuytren's contracture 08/20/2014   Cough 07/21/2014   Lumbar stenosis with neurogenic claudication 06/13/2014   S/P deep brain stimulator placement 05/08/2014   Advance care planning 01/19/2014   Parkinson's disease 12/13/2013   PTSD (post-traumatic stress disorder) 06/13/2013   Erectile dysfunction 06/13/2013   HLD (hyperlipidemia) 06/13/2013   Essential hypertension 06/03/2013    Bradycardia by electrocardiogram 06/03/2013   Obstructive sleep apnea 03/12/2013    ONSET DATE: 15 years ago per chart  REFERRING DIAG: Parkinson's disease  THERAPY DIAG:  Abnormality of gait and mobility  Unsteadiness on feet  Repeated falls  Difficulty in walking, not elsewhere classified  Rationale for Evaluation and Treatment Rehabilitation  SUBJECTIVE:                                                                                                                                                                                              SUBJECTIVE STATEMENT: Fell at home Monday. No significant changes since last session.   Pt accompanied by: [name] (caretaker)  PERTINENT HISTORY:   Date of most recent Admission: 09/25/2021 Date of Discharge: 09/28/2021  Discharge Diagnoses:  Altered mental status Parkinson disease (CMS/HCC) Hallucinations  Per [facility] discharge summary notes PHI : Hospital Course:   72 y.o. male with a history of Parkinson's Disease w/ DBS in place, PD-related dementia, OSA, depression, and seizure disorder who presented for EEG given concern for seizures.  Altered mental status Concern seizure activity Periods of unresponsiveness on the setting of new medication--Nuplazid (pimavanserin) recently started. No events of note on continuous EEG. This was held and he returned to his baseline mental status. Other workup including CT head, CTA h/n without obvious cause for periodic unresponsiveness. Nuplazid was discontinued.   Aspiration, weakness Of note, he had a coughing spell while eating though sounds like was precipitated by sneezing. Did well on RA, seen by SLP without any further needs or concerns.  Parkinson's Disease Parkinson's-related dementia - He follows in the [facility] center at [facility]. Clinic notes suggest he seems to have worsening of his overall disease trajectory. - Continued home Memantine and Sinemet   PMH: HTN,  bradycardia s/p PPM, PD, DBS, COVID-19 infection, chronic low back pain s/p L4/5 surgery.  PAIN:  Are you having pain? Yes: NPRS scale: 6/10 Pain location: Low back Pain description: ache Aggravating factors: Prolonged standing/sitting, bending over Relieving factors: rest, changing positions  OBJECTIVE: (objective measures completed at initial evaluation unless otherwise dated)     INTERVENTION THIS DATE:  TA: Ambulation through [facility] hallway with upWalker. Pt shows improved list to the right with cues, some overcompensation at times but much improved from last session  PWR! Up with PWR! Step To ea side x 10 reps working on dual task and functional movements like reaching for walker or walking aid.   Ambulaiton with U step walker with metronome function working on cadence and speed x 160 feet   Standing PWR! Rock x 10 to ea side  - x 10 with PT randomization to ea side to further sognitive challenge.   Ambulation x 160 feet with U step walker   TE: -seated LAQ 1x12 5lb AW bilat  -seated marching 1x12 bilat 5lb AW   -seated LAQ 1x12 5lb AW bilat  -seated marching 1x12 bilat 5lb AW       PATIENT EDUCATION: Education details: PT plan of care; purpose of functional outcomes Person educated: Patient and Spouse Education method: Explanation and Verbal cues Education comprehension: verbalized understanding   HOME EXERCISE PROGRAM: Access Code: [email or web address] URL: [email or web address] Date: 04/20/2022 Prepared by: Rivka Barbara  Exercises - Sit to stand, walk with assistive device to another chair, sit and repeat.   - 1 x daily - 7 x weekly - 2 sets - 10 reps - Standing March with Counter Support  - 1 x daily - 7 x weekly - 3 sets - 10 reps - Standing in "bowling stance" with counter support   - 1 x daily - 7 x weekly - 3 sets - 10 reps - Heel Raises with Counter Support  - 1 x daily - 7 x weekly - 3 sets - 10 reps - Seated shoulder row while sitting on  stability disc   - 1 x daily - 7 x weekly - 3 sets - 10 reps    GOALS: Goals reviewed with patient? Yes  SHORT TERM GOALS: Target date: 01/11/2022  Pt will be independent with HEP in order to improve strength and balance in order to decrease fall risk and improve function at home and work.  Baseline: Patient reports not performing much in the way of any HEP except walking. 12/27: Having phases of participation and phases of refusal to participate.  Goal status: IN PROGRESS  LONG TERM GOALS: Target date: 02/22/2022  Pt will decrease 5TSTS by at least 5 seconds in order to demonstrate clinically significant improvement in LE strength. Baseline: 02/28/2022: 19.5 sec with UE support  04/13/22:11.4 sec 05/25/22: 11.9 sec  Goal status: MET- continue to monitor for consistent progression   2.  Pt will improve FOTO to target score of 48 to display perceived improvements in ability to complete ADL's.  Baseline: 02/28/22=40 12/27: 45 Goal status: IN PROGRESS  3.  Pt will decrease TUG to < or = to 25 seconds/decrease in order to demonstrate decreased fall risk. Baseline: 02/28/22= 37.26 sec using upright 4WW 12/27:20.21 sec 05/25/22: 27.1 sec ( does not take brakes off without cues)  Goal status: IN PROGRESS Keep assessing to ensure progress is maintained   4.  Pt will increase 10MWT by at least 0.13 m/s in order to demonstrate clinically significant improvement in community ambulation.   Baseline: 02/28/22= 0.67 m/s using upright 4WW 12/27: .89 m/s  Goal status: MET  5.  Patient will increase six minute walk test distance to >1000 for progression to community ambulator and improve gait ability  Baseline: 02/28/22: 720 feet  with up walker 04/13/22: 815 feet with up walker 05/25/22: 877 feet with Up walker  Goal status: IN PROGRESS  6.  Patient will experience 1 fall or less per 2 week period for at least 3 weeks in order to improve his frequency of falls and prevent injury from falls   Baseline:04/13/22: Pt has had 1 fall in the last 2 weeks to his knowledge 05/25/22: 2 falls in last 2 weeks  Goal status: IN PROGRESS  ASSESSMENT:  CLINICAL IMPRESSION: Pt shows fair motivation for completion of PT activities.Pt progresses with PWR moves in combination with dual task cognitive with good results. Pt still showing impaired gait speed and requires cues to prevent R list but this does show improvement compared to previous session. Pt still having falls that pose risk of significant injury. Pt will continue to benefit from skilled physical therapy intervention to address impairments, improve QOL, and attain therapy goals.      OBJECTIVE IMPAIRMENTS Abnormal gait, decreased activity tolerance, decreased balance, decreased cognition, decreased coordination, decreased endurance, decreased mobility, difficulty walking, decreased ROM, decreased strength, decreased safety awareness, hypomobility, impaired perceived functional ability, and pain.   ACTIVITY LIMITATIONS carrying, lifting, bending, standing, squatting, stairs, transfers, bed mobility, bathing, toileting, dressing, self feeding, hygiene/grooming, and caring for others  PARTICIPATION LIMITATIONS: meal prep, cleaning, laundry, medication management, personal finances, driving, shopping, community activity, and yard work  PERSONAL FACTORS Time since onset of injury/illness/exacerbation are also affecting patient's functional outcome.   REHAB POTENTIAL: Fair    CLINICAL DECISION MAKING: Evolving/moderate complexity  EVALUATION COMPLEXITY: Moderate  PLAN: PT FREQUENCY: 2x/week  PT DURATION: 12 weeks  PLANNED INTERVENTIONS: Therapeutic exercises, Therapeutic activity, Neuromuscular re-education, Balance training, Gait training, Patient/Family education, Self Care, Joint mobilization, Joint manipulation, Stair training, Vestibular training, Canalith repositioning, DME instructions, Dry Needling, Electrical stimulation, Spinal  manipulation, Spinal mobilization, Cryotherapy, Moist heat, Manual therapy, and Re-evaluation  PLAN FOR NEXT SESSION: Core strength, balance training   12:25 PM, 06/20/22 Particia Lather PT ,DPT Physical Therapist- [facility]

## 2022-06-22 ENCOUNTER — Ambulatory Visit: Payer: Medicare PPO

## 2022-06-22 ENCOUNTER — Ambulatory Visit: Payer: Medicare PPO | Admitting: Physical Therapy

## 2022-06-22 DIAGNOSIS — R269 Unspecified abnormalities of gait and mobility: Secondary | ICD-10-CM | POA: Diagnosis not present

## 2022-06-22 DIAGNOSIS — R262 Difficulty in walking, not elsewhere classified: Secondary | ICD-10-CM | POA: Diagnosis not present

## 2022-06-22 DIAGNOSIS — R2681 Unsteadiness on feet: Secondary | ICD-10-CM

## 2022-06-22 DIAGNOSIS — G20A1 Parkinson's disease without dyskinesia, without mention of fluctuations: Secondary | ICD-10-CM | POA: Diagnosis not present

## 2022-06-22 DIAGNOSIS — R296 Repeated falls: Secondary | ICD-10-CM | POA: Diagnosis not present

## 2022-06-22 NOTE — Therapy (Signed)
OUTPATIENT PHYSICAL THERAPY NEURO Treatment      Patient Name: [name] MRN: [id number] DOB:Feb 13, 1951, 72 y.o., male Today's Date: 06/22/2022   PCP: Dr. Elsie Stain REFERRING PROVIDER: Dr. Sharolyn Douglas   PT End of Session - 06/22/22 1153     Visit Number 24    Number of Visits 69    Date for PT Re-Evaluation 08/15/22    Authorization Type [employer] Medicare Choice PPO    Progress Note Due on Visit 30    PT Start Time 1146    PT Stop Time 1225    PT Time Calculation (min) 39 min    Equipment Utilized During Treatment Gait belt    Activity Tolerance Patient tolerated treatment well;No increased pain    Behavior During Therapy WFL for tasks assessed/performed                               Past Medical History:  Diagnosis Date   Arthritis    Bradycardia    Cancer ([place]) 2013   skin cancer   Depression    ptsd   Dysrhythmia    chronic slow heart rate   GERD (gastroesophageal reflux disease)    Headache(784.0)    tension headaches non recent   History of chicken pox    History of kidney stones    passed   Hypertension    treated with HCTZ   Pacemaker    Parkinson's disease    dx'ed 15 years ago   PTSD (post-traumatic stress disorder)    Shortness of breath dyspnea    Sleep apnea    doesn't use C-pap   Varicose veins    Past Surgical History:  Procedure Laterality Date   CHOLECYSTECTOMY N/A 10/22/2014   Procedure: LAPAROSCOPIC CHOLECYSTECTOMY WITH INTRAOPERATIVE CHOLANGIOGRAM;  Surgeon: Dia Crawford III, MD;  Location: [facility] ORS;  Service: General;  Laterality: N/A;   cyst removed      from lip as a child   INTRAMEDULLARY (IM) NAIL INTERTROCHANTERIC N/A 02/18/2019   Procedure: INTRAMEDULLARY (IM) NAIL INTERTROCHANTRIC, RIGHT,;  Surgeon: Thornton Park, MD;  Location: [facility] ORS;  Service: Orthopedics;  Laterality: N/A;   LUMBAR LAMINECTOMY/DECOMPRESSION MICRODISCECTOMY Bilateral 12/14/2012   Procedure: Bilateral lumbar three-four, four-five  decompressive laminotomy/foraminotomy;  Surgeon: Charlie Pitter, MD;  Location: [place] [facility]UR[facility] ORS;  Service: Neurosurgery;  Laterality: Bilateral;   PULSE GENERATOR IMPLANT Bilateral 12/13/2013   Procedure: Bilateral implantable pulse generator placement;  Surgeon: Erline Levine, MD;  Location: [place] [facility];  Service: Neurosurgery;  Laterality: Bilateral;  Bilateral implantable pulse generator placement   skin cancer removed     from ears,   12 lft arm  rt leg 15   SUBTHALAMIC STIMULATOR BATTERY REPLACEMENT Bilateral 07/14/2017   Procedure: BILATERAL IMPLANTED PULSE GENERATOR CHANGE FOR DEEP BRAIN STIMULATOR;  Surgeon: Erline Levine, MD;  Location: [place];  Service: Neurosurgery;  Laterality: Bilateral;   SUBTHALAMIC STIMULATOR INSERTION Bilateral 12/06/2013   Procedure: SUBTHALAMIC STIMULATOR INSERTION;  Surgeon: Erline Levine, MD;  Location: [place] [facility]URO [facility];  Service: Neurosurgery;  Laterality: Bilateral;  Bilateral deep brain stimulator placement   Patient Active Problem List   Diagnosis Date Noted   Hematuria 10/10/2021   Sinus drainage 12/09/2020   Medicare annual wellness visit, subsequent 11/18/2020   Pacemaker    Dementia associated with Parkinson's disease ([place]) 06/17/2020   Vertigo 02/19/2020   Urine abnormality 02/19/2020   Dysphagia 02/23/2019   Fall at home 10/18/2017  REM behavioral disorder 11/02/2016   Radicular pain in right arm 10/09/2016   GERD (gastroesophageal reflux disease) 07/31/2016   Colon cancer screening 07/23/2015   Gout 02/18/2015   Depression 01/06/2015   Skin lesion 10/02/2014   Dupuytren's contracture 08/20/2014   Cough 07/21/2014   Lumbar stenosis with neurogenic claudication 06/13/2014   S/P deep brain stimulator placement 05/08/2014   Advance care planning 01/19/2014   Parkinson's disease 12/13/2013   PTSD (post-traumatic stress disorder) 06/13/2013   Erectile dysfunction 06/13/2013   HLD (hyperlipidemia) 06/13/2013   Essential hypertension 06/03/2013    Bradycardia by electrocardiogram 06/03/2013   Obstructive sleep apnea 03/12/2013    ONSET DATE: 15 years ago per chart  REFERRING DIAG: Parkinson's disease  THERAPY DIAG:  Abnormality of gait and mobility  Unsteadiness on feet  Difficulty in walking, not elsewhere classified  Parkinson's disease without dyskinesia or fluctuating manifestations  Rationale for Evaluation and Treatment Rehabilitation  SUBJECTIVE:                                                                                                                                                                                              SUBJECTIVE STATEMENT: No falls since last session. Pt presents with different walker this date due to weather and inability to get bed cover over other walker. Pt is a little off balanced as a result.   Pt accompanied by: [name] (caretaker)  PERTINENT HISTORY:   Date of most recent Admission: 09/25/2021 Date of Discharge: 09/28/2021  Discharge Diagnoses:  Altered mental status Parkinson disease (CMS/HCC) Hallucinations  Per [facility] discharge summary notes PHI : Hospital Course:   72 y.o. male with a history of Parkinson's Disease w/ DBS in place, PD-related dementia, OSA, depression, and seizure disorder who presented for EEG given concern for seizures.  Altered mental status Concern seizure activity Periods of unresponsiveness on the setting of new medication--Nuplazid (pimavanserin) recently started. No events of note on continuous EEG. This was held and he returned to his baseline mental status. Other workup including CT head, CTA h/n without obvious cause for periodic unresponsiveness. Nuplazid was discontinued.   Aspiration, weakness Of note, he had a coughing spell while eating though sounds like was precipitated by sneezing. Did well on RA, seen by SLP without any further needs or concerns.  Parkinson's Disease Parkinson's-related dementia - He follows in the [facility]. Clinic notes suggest he seems to have worsening of his overall disease trajectory. - Continued home Memantine and Sinemet   PMH: HTN, bradycardia s/p PPM, PD, DBS, COVID-19 infection, chronic low back pain s/p L4/5 surgery.  PAIN:  Are you having pain? Yes: NPRS scale: 6/10 Pain location: Low back Pain description: ache Aggravating factors: Prolonged standing/sitting, bending over Relieving factors: rest, changing positions   OBJECTIVE: (objective measures completed at initial evaluation unless otherwise dated)     INTERVENTION THIS DATE:  TA:  Ambulation with up walker to waiting area in medical arts and back. With fatigue gait deteriorates and pt requires cues for wider BOS ( medial aspect of feet were rubbing together due to adducted gait) . Pt shows improved list to the right with without cues and without overcompensation this date   PWR! Up with PWR! Step To ea side x 10 reps working on dual task and functional movements like reaching for walker or walking aid.   Seated PWR! Twist seated on dynadisc  2 x 10 reps with PT cues for "twist" and "get ready" for adequate return to starting position each rep   Standing PWR! Rock x 10 to ea side  - x 10 with PT randomization to ea side to further cognitive challenge. PT uses hands for external visual cue for reps when pt not reaching, also cues for head movement to further weight shift challenge    TE: -seated LAQ 1x12 5lb AW bilat  -seated marching 1x12 bilat 5lb AW   Added dynadisc to sit on for further challenge for core stabilization  -seated LAQ 1x12 5lb AW bilat  -seated marching 1x12 bilat 5lb AW       PATIENT EDUCATION: Education details: PT plan of care; purpose of functional outcomes Person educated: Patient and Spouse Education method: Explanation and Verbal cues Education comprehension: verbalized understanding   HOME EXERCISE PROGRAM: Access Code: [email or web address] URL:  [email or web address] Date: 04/20/2022 Prepared by: Rivka Barbara  Exercises - Sit to stand, walk with assistive device to another chair, sit and repeat.   - 1 x daily - 7 x weekly - 2 sets - 10 reps - Standing March with Counter Support  - 1 x daily - 7 x weekly - 3 sets - 10 reps - Standing in "bowling stance" with counter support   - 1 x daily - 7 x weekly - 3 sets - 10 reps - Heel Raises with Counter Support  - 1 x daily - 7 x weekly - 3 sets - 10 reps - Seated shoulder row while sitting on stability disc   - 1 x daily - 7 x weekly - 3 sets - 10 reps    GOALS: Goals reviewed with patient? Yes  SHORT TERM GOALS: Target date: 01/11/2022  Pt will be independent with HEP in order to improve strength and balance in order to decrease fall risk and improve function at home and work.  Baseline: Patient reports not performing much in the way of any HEP except walking. 12/27: Having phases of participation and phases of refusal to participate.  Goal status: IN PROGRESS  LONG TERM GOALS: Target date: 02/22/2022  Pt will decrease 5TSTS by at least 5 seconds in order to demonstrate clinically significant improvement in LE strength. Baseline: 02/28/2022: 19.5 sec with UE support  04/13/22:11.4 sec 05/25/22: 11.9 sec  Goal status: MET- continue to monitor for consistent progression   2.  Pt will improve FOTO to target score of 48 to display perceived improvements in ability to complete ADL's.  Baseline: 02/28/22=40 12/27: 45 Goal status: IN PROGRESS  3.  Pt will decrease TUG to < or = to 25 seconds/decrease in order to demonstrate decreased fall risk. Baseline: 02/28/22= 37.26 sec using  upright 4WW 12/27:20.21 sec 05/25/22: 27.1 sec ( does not take brakes off without cues)  Goal status: IN PROGRESS Keep assessing to ensure progress is maintained   4.  Pt will increase 10MWT by at least 0.13 m/s in order to demonstrate clinically significant improvement in community ambulation.    Baseline: 02/28/22= 0.67 m/s using upright 4WW 12/27: .89 m/s  Goal status: MET  5.  Patient will increase six minute walk test distance to >1000 for progression to community ambulator and improve gait ability  Baseline: 02/28/22: 720 feet with up walker 04/13/22: 815 feet with up walker 05/25/22: 877 feet with Up walker  Goal status: IN PROGRESS  6.  Patient will experience 1 fall or less per 2 week period for at least 3 weeks in order to improve his frequency of falls and prevent injury from falls  Baseline:04/13/22: Pt has had 1 fall in the last 2 weeks to his knowledge 05/25/22: 2 falls in last 2 weeks  Goal status: IN PROGRESS  ASSESSMENT:  CLINICAL IMPRESSION: Pt shows fair motivation for completion of PT activities.Pt progresses with PWR moves in combination with dual task cognitive with fair results and increased cues were needed for proper completion this date. Pt still showing impaired gait speed and but showed improved list this session with no need for cueing for proper forward progression this date, . Pt still having falls that pose risk of significant injury. Pt will continue to benefit from skilled physical therapy intervention to address impairments, improve QOL, and attain therapy goals.      OBJECTIVE IMPAIRMENTS Abnormal gait, decreased activity tolerance, decreased balance, decreased cognition, decreased coordination, decreased endurance, decreased mobility, difficulty walking, decreased ROM, decreased strength, decreased safety awareness, hypomobility, impaired perceived functional ability, and pain.   ACTIVITY LIMITATIONS carrying, lifting, bending, standing, squatting, stairs, transfers, bed mobility, bathing, toileting, dressing, self feeding, hygiene/grooming, and caring for others  PARTICIPATION LIMITATIONS: meal prep, cleaning, laundry, medication management, personal finances, driving, shopping, community activity, and yard work  PERSONAL FACTORS Time since onset of  injury/illness/exacerbation are also affecting patient's functional outcome.   REHAB POTENTIAL: Fair    CLINICAL DECISION MAKING: Evolving/moderate complexity  EVALUATION COMPLEXITY: Moderate  PLAN: PT FREQUENCY: 2x/week  PT DURATION: 12 weeks  PLANNED INTERVENTIONS: Therapeutic exercises, Therapeutic activity, Neuromuscular re-education, Balance training, Gait training, Patient/Family education, Self Care, Joint mobilization, Joint manipulation, Stair training, Vestibular training, Canalith repositioning, DME instructions, Dry Needling, Electrical stimulation, Spinal manipulation, Spinal mobilization, Cryotherapy, Moist heat, Manual therapy, and Re-evaluation  PLAN FOR NEXT SESSION: Core strength, balance training, ambulatory endurance  12:39 PM, 06/22/22 Particia Lather PT ,DPT Physical Therapist- [facility]

## 2022-06-27 ENCOUNTER — Ambulatory Visit: Payer: Medicare PPO | Admitting: Physical Therapy

## 2022-06-27 ENCOUNTER — Ambulatory Visit: Payer: Medicare PPO

## 2022-06-27 DIAGNOSIS — R269 Unspecified abnormalities of gait and mobility: Secondary | ICD-10-CM | POA: Diagnosis not present

## 2022-06-27 DIAGNOSIS — R262 Difficulty in walking, not elsewhere classified: Secondary | ICD-10-CM

## 2022-06-27 DIAGNOSIS — R2681 Unsteadiness on feet: Secondary | ICD-10-CM

## 2022-06-27 DIAGNOSIS — R296 Repeated falls: Secondary | ICD-10-CM | POA: Diagnosis not present

## 2022-06-27 DIAGNOSIS — G20A1 Parkinson's disease without dyskinesia, without mention of fluctuations: Secondary | ICD-10-CM | POA: Diagnosis not present

## 2022-06-27 NOTE — Therapy (Signed)
OUTPATIENT PHYSICAL THERAPY NEURO Treatment      Patient Name: [name] MRN: [id number] DOB:February 18, 1951, 72 y.o., male Today's Date: 06/27/2022   PCP: Dr. Elsie Stain REFERRING PROVIDER: Dr. Sharolyn Douglas   PT End of Session - 06/27/22 1255     Visit Number 25    Number of Visits 15    Date for PT Re-Evaluation 08/15/22    Authorization Type [employer] Choice PPO    Progress Note Due on Visit 30    PT Start Time 1141    PT Stop Time 1223    PT Time Calculation (min) 42 min    Equipment Utilized During Treatment Gait belt    Activity Tolerance Patient tolerated treatment well;No increased pain    Behavior During Therapy WFL for tasks assessed/performed                                Past Medical History:  Diagnosis Date   Arthritis    Bradycardia    Cancer ([place]) 2013   skin cancer   Depression    ptsd   Dysrhythmia    chronic slow heart rate   GERD (gastroesophageal reflux disease)    Headache(784.0)    tension headaches non recent   History of chicken pox    History of kidney stones    passed   Hypertension    treated with HCTZ   Pacemaker    Parkinson's disease    dx'ed 15 years ago   PTSD (post-traumatic stress disorder)    Shortness of breath dyspnea    Sleep apnea    doesn't use C-pap   Varicose veins    Past Surgical History:  Procedure Laterality Date   CHOLECYSTECTOMY N/A 10/22/2014   Procedure: LAPAROSCOPIC CHOLECYSTECTOMY WITH INTRAOPERATIVE CHOLANGIOGRAM;  Surgeon: Dia Crawford III, MD;  Location: [facility];  Service: General;  Laterality: N/A;   cyst removed      from lip as a child   INTRAMEDULLARY (IM) NAIL INTERTROCHANTERIC N/A 02/18/2019   Procedure: INTRAMEDULLARY (IM) NAIL INTERTROCHANTRIC, RIGHT,;  Surgeon: Thornton Park, MD;  Location: [facility];  Service: Orthopedics;  Laterality: N/A;   LUMBAR LAMINECTOMY/DECOMPRESSION MICRODISCECTOMY Bilateral 12/14/2012   Procedure: Bilateral lumbar three-four,  four-five decompressive laminotomy/foraminotomy;  Surgeon: Charlie Pitter, MD;  Location: [facility];  Service: Neurosurgery;  Laterality: Bilateral;   PULSE GENERATOR IMPLANT Bilateral 12/13/2013   Procedure: Bilateral implantable pulse generator placement;  Surgeon: Erline Levine, MD;  Location: [facility];  Service: Neurosurgery;  Laterality: Bilateral;  Bilateral implantable pulse generator placement   skin cancer removed     from ears,   12 lft arm  rt leg 15   SUBTHALAMIC STIMULATOR BATTERY REPLACEMENT Bilateral 07/14/2017   Procedure: BILATERAL IMPLANTED PULSE GENERATOR CHANGE FOR DEEP BRAIN STIMULATOR;  Surgeon: Erline Levine, MD;  Location: [facility];  Service: Neurosurgery;  Laterality: Bilateral;   SUBTHALAMIC STIMULATOR INSERTION Bilateral 12/06/2013   Procedure: SUBTHALAMIC STIMULATOR INSERTION;  Surgeon: Erline Levine, MD;  Location: [facility];  Service: Neurosurgery;  Laterality: Bilateral;  Bilateral deep brain stimulator placement   Patient Active Problem List   Diagnosis Date Noted   Hematuria 10/10/2021   Sinus drainage 12/09/2020   Medicare annual wellness visit, subsequent 11/18/2020   Pacemaker    Dementia associated with Parkinson's disease ([place]) 06/17/2020   Vertigo 02/19/2020   Urine abnormality 02/19/2020   Dysphagia 02/23/2019   Fall at home 10/18/2017  REM behavioral disorder 11/02/2016   Radicular pain in right arm 10/09/2016   GERD (gastroesophageal reflux disease) 07/31/2016   Colon cancer screening 07/23/2015   Gout 02/18/2015   Depression 01/06/2015   Skin lesion 10/02/2014   Dupuytren's contracture 08/20/2014   Cough 07/21/2014   Lumbar stenosis with neurogenic claudication 06/13/2014   S/P deep brain stimulator placement 05/08/2014   Advance care planning 01/19/2014   Parkinson's disease 12/13/2013   PTSD (post-traumatic stress disorder) 06/13/2013   Erectile dysfunction 06/13/2013   HLD (hyperlipidemia) 06/13/2013   Essential hypertension  06/03/2013   Bradycardia by electrocardiogram 06/03/2013   Obstructive sleep apnea 03/12/2013    ONSET DATE: 15 years ago per chart  REFERRING DIAG: Parkinson's disease  THERAPY DIAG:  Abnormality of gait and mobility  Unsteadiness on feet  Difficulty in walking, not elsewhere classified  Parkinson's disease without dyskinesia or fluctuating manifestations  Repeated falls  Rationale for Evaluation and Treatment Rehabilitation  SUBJECTIVE:                                                                                                                                                                                              SUBJECTIVE STATEMENT: No falls since last session. Pt presents with different walker this date due to weather and inability to get bed cover over other walker. Pt is a little off balanced as a result.   Pt accompanied by: [name] (caretaker)  PERTINENT HISTORY:   Date of most recent Admission: 09/25/2021 Date of Discharge: 09/28/2021  Discharge Diagnoses:  Altered mental status Parkinson disease (CMS/HCC) Hallucinations  Per [facility] discharge summary notes PHI : Hospital Course:   72 y.o. male with a history of Parkinson's Disease w/ DBS in place, PD-related dementia, OSA, depression, and seizure disorder who presented for EEG given concern for seizures.  Altered mental status Concern seizure activity Periods of unresponsiveness on the setting of new medication--Nuplazid (pimavanserin) recently started. No events of note on continuous EEG. This was held and he returned to his baseline mental status. Other workup including CT head, CTA h/n without obvious cause for periodic unresponsiveness. Nuplazid was discontinued.   Aspiration, weakness Of note, he had a coughing spell while eating though sounds like was precipitated by sneezing. Did well on RA, seen by SLP without any further needs or concerns.  Parkinson's Disease Parkinson's-related  dementia - He follows in the [facility] center [facility]. Clinic notes suggest he seems to have worsening of his overall disease trajectory. - Continued home Memantine and Sinemet   PMH: HTN, bradycardia s/p PPM, PD, DBS, COVID-19 infection, chronic low back pain s/p L4/5  surgery.  PAIN:  Are you having pain? Yes: NPRS scale: 6/10 Pain location: Low back Pain description: ache Aggravating factors: Prolonged standing/sitting, bending over Relieving factors: rest, changing positions   OBJECTIVE: (objective measures completed at initial evaluation unless otherwise dated)     INTERVENTION THIS DATE:  TA:  Ambulation with up walker to waiting area in medical arts and back. With fatigue gait deteriorates and pt requires cues for wider BOS ( medial aspect of feet were rubbing together due to adducted gait) . Pt shows improved list to the right with without cues and without overcompensation this date  -2.5# AW donned   PWR! Up with PWR! Step To ea side x 10 reps working on dual task and functional movements like reaching for walker or walking aid.   Seated PWR! Twist seated on dynadisc  2 x 10 reps with PT cues for "twist" and "get ready" for adequate return to starting position each rep   Standing PWR! Rock x 10 to ea side  - x 10 with PT randomization to ea side to further cognitive challenge. PT uses hands for external visual cue for reps when pt not reaching, also cues for head movement to further weight shift challenge   Seated PWR! Up 2 x 5 reps, cues for forward reach to floor and post reach to wall   Seated on dynadisc GTB rows 2 x 15 rep, cues for form  Precor leg press x 10 reps 55#  -10 reps x 70 # x 3 sets           PATIENT EDUCATION: Education details: PT plan of care; purpose of functional outcomes Person educated: Patient and Spouse Education method: Explanation and Verbal cues Education comprehension: verbalized understanding   HOME EXERCISE  PROGRAM: Access Code: [email or web address] URL: [email or web address] Date: 04/20/2022 Prepared by: Rivka Barbara  Exercises - Sit to stand, walk with assistive device to another chair, sit and repeat.   - 1 x daily - 7 x weekly - 2 sets - 10 reps - Standing March with Counter Support  - 1 x daily - 7 x weekly - 3 sets - 10 reps - Standing in "bowling stance" with counter support   - 1 x daily - 7 x weekly - 3 sets - 10 reps - Heel Raises with Counter Support  - 1 x daily - 7 x weekly - 3 sets - 10 reps - Seated shoulder row while sitting on stability disc   - 1 x daily - 7 x weekly - 3 sets - 10 reps    GOALS: Goals reviewed with patient? Yes  SHORT TERM GOALS: Target date: 01/11/2022  Pt will be independent with HEP in order to improve strength and balance in order to decrease fall risk and improve function at home and work.  Baseline: Patient reports not performing much in the way of any HEP except walking. 12/27: Having phases of participation and phases of refusal to participate.  Goal status: IN PROGRESS  LONG TERM GOALS: Target date: 02/22/2022  Pt will decrease 5TSTS by at least 5 seconds in order to demonstrate clinically significant improvement in LE strength. Baseline: 02/28/2022: 19.5 sec with UE support  04/13/22:11.4 sec 05/25/22: 11.9 sec  Goal status: MET- continue to monitor for consistent progression   2.  Pt will improve FOTO to target score of 48 to display perceived improvements in ability to complete ADL's.  Baseline: 02/28/22=40 12/27: 45 Goal status: IN PROGRESS  3.  Pt will decrease TUG  to < or = to 25 seconds/decrease in order to demonstrate decreased fall risk. Baseline: 02/28/22= 37.26 sec using upright 4WW 12/27:20.21 sec 05/25/22: 27.1 sec ( does not take brakes off without cues)  Goal status: IN PROGRESS Keep assessing to ensure progress is maintained   4.  Pt will increase 10MWT by at least 0.13 m/s in order to demonstrate clinically significant  improvement in community ambulation.   Baseline: 02/28/22= 0.67 m/s using upright 4WW 12/27: .89 m/s  Goal status: MET  5.  Patient will increase six minute walk test distance to >1000 for progression to community ambulator and improve gait ability  Baseline: 02/28/22: 720 feet with up walker 04/13/22: 815 feet with up walker 05/25/22: 877 feet with Up walker  Goal status: IN PROGRESS  6.  Patient will experience 1 fall or less per 2 week period for at least 3 weeks in order to improve his frequency of falls and prevent injury from falls  Baseline:04/13/22: Pt has had 1 fall in the last 2 weeks to his knowledge 05/25/22: 2 falls in last 2 weeks  Goal status: IN PROGRESS  ASSESSMENT:  CLINICAL IMPRESSION: Pt shows good  motivation for completion of PT activities.Pt progresses with PWR moves in combination with dual task cognitive with continued frequent cues needed for proper performance. Pt challenged with gait with ankle weights this date and shows improved endurance with this task although he reports he is challenged. Pt encouraged to use PWR! Moves in daily routine to improve balance, transitions and safety with tasks. Pt still having falls that pose risk of significant injury. Pt will continue to benefit from skilled physical therapy intervention to address impairments, improve QOL, and attain therapy goals.      OBJECTIVE IMPAIRMENTS Abnormal gait, decreased activity tolerance, decreased balance, decreased cognition, decreased coordination, decreased endurance, decreased mobility, difficulty walking, decreased ROM, decreased strength, decreased safety awareness, hypomobility, impaired perceived functional ability, and pain.   ACTIVITY LIMITATIONS carrying, lifting, bending, standing, squatting, stairs, transfers, bed mobility, bathing, toileting, dressing, self feeding, hygiene/grooming, and caring for others  PARTICIPATION LIMITATIONS: meal prep, cleaning, laundry, medication management,  personal finances, driving, shopping, community activity, and yard work  PERSONAL FACTORS Time since onset of injury/illness/exacerbation are also affecting patient's functional outcome.   REHAB POTENTIAL: Fair    CLINICAL DECISION MAKING: Evolving/moderate complexity  EVALUATION COMPLEXITY: Moderate  PLAN: PT FREQUENCY: 2x/week  PT DURATION: 12 weeks  PLANNED INTERVENTIONS: Therapeutic exercises, Therapeutic activity, Neuromuscular re-education, Balance training, Gait training, Patient/Family education, Self Care, Joint mobilization, Joint manipulation, Stair training, Vestibular training, Canalith repositioning, DME instructions, Dry Needling, Electrical stimulation, Spinal manipulation, Spinal mobilization, Cryotherapy, Moist heat, Manual therapy, and Re-evaluation  PLAN FOR NEXT SESSION: Core strength, balance training, ambulatory endurance  12:55 PM, 06/27/22 Particia Lather PT ,DPT Physical Therapist- [facility]

## 2022-06-29 ENCOUNTER — Ambulatory Visit: Payer: Medicare PPO | Admitting: Physical Therapy

## 2022-06-29 ENCOUNTER — Encounter: Payer: Self-pay | Admitting: Physical Therapy

## 2022-06-29 ENCOUNTER — Ambulatory Visit: Payer: Medicare PPO

## 2022-06-29 DIAGNOSIS — R296 Repeated falls: Secondary | ICD-10-CM

## 2022-06-29 DIAGNOSIS — G20A1 Parkinson's disease without dyskinesia, without mention of fluctuations: Secondary | ICD-10-CM | POA: Diagnosis not present

## 2022-06-29 DIAGNOSIS — R269 Unspecified abnormalities of gait and mobility: Secondary | ICD-10-CM | POA: Diagnosis not present

## 2022-06-29 DIAGNOSIS — R262 Difficulty in walking, not elsewhere classified: Secondary | ICD-10-CM

## 2022-06-29 DIAGNOSIS — R2681 Unsteadiness on feet: Secondary | ICD-10-CM

## 2022-06-29 NOTE — Therapy (Signed)
OUTPATIENT PHYSICAL THERAPY NEURO Treatment      Patient Name: [name] MRN: [id number] DOB:[birth year], 72 y.o., male Today's Date: 06/29/2022   PCP: Dr. Elsie Stain REFERRING PROVIDER: Dr. Sharolyn Douglas   PT End of Session - 06/29/22 1149     Visit Number 26    Number of Visits 24    Date for PT Re-Evaluation 08/15/22    Authorization Type [employer] Medicare Choice PPO    Progress Note Due on Visit 30    PT Start Time 1147    PT Stop Time 1225    PT Time Calculation (min) 38 min    Equipment Utilized During Treatment Gait belt    Activity Tolerance Patient tolerated treatment well;No increased pain    Behavior During Therapy WFL for tasks assessed/performed                                Past Medical History:  Diagnosis Date   Arthritis    Bradycardia    Cancer ([place]) 2013   skin cancer   Depression    ptsd   Dysrhythmia    chronic slow heart rate   GERD (gastroesophageal reflux disease)    Headache(784.0)    tension headaches non recent   History of chicken pox    History of kidney stones    passed   Hypertension    treated with HCTZ   Pacemaker    Parkinson's disease    dx'ed 15 years ago   PTSD (post-traumatic stress disorder)    Shortness of breath dyspnea    Sleep apnea    doesn't use C-pap   Varicose veins    Past Surgical History:  Procedure Laterality Date   CHOLECYSTECTOMY N/A 10/22/2014   Procedure: LAPAROSCOPIC CHOLECYSTECTOMY WITH INTRAOPERATIVE CHOLANGIOGRAM;  Surgeon: Dia Crawford III, MD;  Location: [facility] ORS;  Service: General;  Laterality: N/A;   cyst removed      from lip as a child   INTRAMEDULLARY (IM) NAIL INTERTROCHANTERIC N/A 02/18/2019   Procedure: INTRAMEDULLARY (IM) NAIL INTERTROCHANTRIC, RIGHT,;  Surgeon: Thornton Park, MD;  Location: [facility] ORS;  Service: Orthopedics;  Laterality: N/A;   LUMBAR LAMINECTOMY/DECOMPRESSION MICRODISCECTOMY Bilateral 12/14/2012   Procedure: Bilateral lumbar three-four,  four-five decompressive laminotomy/foraminotomy;  Surgeon: Charlie Pitter, MD;  Location: [place] [facility];  Service: Neurosurgery;  Laterality: Bilateral;   PULSE GENERATOR IMPLANT Bilateral 12/13/2013   Procedure: Bilateral implantable pulse generator placement;  Surgeon: Erline Levine, MD;  Location: [place] [facility]UR[facility];  Service: Neurosurgery;  Laterality: Bilateral;  Bilateral implantable pulse generator placement   skin cancer removed     from ears,   12 lft arm  rt leg 15   SUBTHALAMIC STIMULATOR BATTERY REPLACEMENT Bilateral 07/14/2017   Procedure: BILATERAL IMPLANTED PULSE GENERATOR CHANGE FOR DEEP BRAIN STIMULATOR;  Surgeon: Erline Levine, MD;  Location: [place];  Service: Neurosurgery;  Laterality: Bilateral;   SUBTHALAMIC STIMULATOR INSERTION Bilateral 12/06/2013   Procedure: SUBTHALAMIC STIMULATOR INSERTION;  Surgeon: Erline Levine, MD;  Location: [place] [facility];  Service: Neurosurgery;  Laterality: Bilateral;  Bilateral deep brain stimulator placement   Patient Active Problem List   Diagnosis Date Noted   Hematuria 10/10/2021   Sinus drainage 12/09/2020   Medicare annual wellness visit, subsequent 11/18/2020   Pacemaker    Dementia associated with Parkinson's disease ([place]) 06/17/2020   Vertigo 02/19/2020   Urine abnormality 02/19/2020   Dysphagia 02/23/2019   Fall at home 10/18/2017  REM behavioral disorder 11/02/2016   Radicular pain in right arm 10/09/2016   GERD (gastroesophageal reflux disease) 07/31/2016   Colon cancer screening 07/23/2015   Gout 02/18/2015   Depression 01/06/2015   Skin lesion 10/02/2014   Dupuytren's contracture 08/20/2014   Cough 07/21/2014   Lumbar stenosis with neurogenic claudication 06/13/2014   S/P deep brain stimulator placement 05/08/2014   Advance care planning 01/19/2014   Parkinson's disease 12/13/2013   PTSD (post-traumatic stress disorder) 06/13/2013   Erectile dysfunction 06/13/2013   HLD (hyperlipidemia) 06/13/2013   Essential hypertension  06/03/2013   Bradycardia by electrocardiogram 06/03/2013   Obstructive sleep apnea 03/12/2013    ONSET DATE: 15 years ago per chart  REFERRING DIAG: Parkinson's disease  THERAPY DIAG:  Abnormality of gait and mobility  Unsteadiness on feet  Difficulty in walking, not elsewhere classified  Repeated falls  Rationale for Evaluation and Treatment Rehabilitation  SUBJECTIVE:                                                                                                                                                                                              SUBJECTIVE STATEMENT: No falls since last session. Pt has been doing well.   Pt accompanied by: [name] (caretaker)  PERTINENT HISTORY:   Date of most recent Admission: 09/25/2021 Date of Discharge: 09/28/2021  Discharge Diagnoses:  Altered mental status Parkinson disease (CMS/HCC) Hallucinations  Per [facility] discharge summary notes PHI : Hospital Course:   72 y.o. male with a history of Parkinson's Disease w/ DBS in place, PD-related dementia, OSA, depression, and seizure disorder who presented for EEG given concern for seizures.  Altered mental status Concern seizure activity Periods of unresponsiveness on the setting of new medication--Nuplazid (pimavanserin) recently started. No events of note on continuous EEG. This was held and he returned to his baseline mental status. Other workup including CT head, CTA h/n without obvious cause for periodic unresponsiveness. Nuplazid was discontinued.   Aspiration, weakness Of note, he had a coughing spell while eating though sounds like was precipitated by sneezing. Did well on RA, seen by SLP without any further needs or concerns.  Parkinson's Disease Parkinson's-related dementia - He follows in the Movement Disorders center at [facility]. Clinic notes suggest he seems to have worsening of his overall disease trajectory. - Continued home Memantine and Sinemet   PMH: HTN,  bradycardia s/p PPM, PD, DBS, COVID-19 infection, chronic low back pain s/p L4/5 surgery.  PAIN:  Are you having pain? Yes: NPRS scale: 6/10 Pain location: Low back Pain description: ache Aggravating factors: Prolonged standing/sitting, bending over Relieving factors: rest, changing positions  OBJECTIVE: (objective measures completed at initial evaluation unless otherwise dated)     INTERVENTION THIS DATE:  TA:  Ambulation with up walker to waiting area in medical arts and back. With fatigue gait deteriorates and pt requires cues for wider BOS ( medial aspect of feet were rubbing together due to adducted gait) . Pt challenged with metronome on u step this date.  -2.5# AW donned   PWR! Up with PWR! Step To ea side x 10 reps working on dual task and functional movements like reaching for walker or walking aid.   Seated PWR! Twist seated on dynadisc  2 x 10 reps with PT cues for "twist" and "get ready" for adequate return to starting position each rep   Standing PWR! Rock x 10 to ea side  - x 10 with less verbal cues for proper performance ( decreased frequency of cues necessary)   Seated PWR! Up 2 x 5 reps, cues for forward reach to floor and post reach to wall   Seated on dynadisc GTB rows 2 x 15 rep, cues for form  Precor leg press   -10 reps x 70 # x 1 sets  -10 reps @ 85# x 2 sets  -10 reps at 100 #          PATIENT EDUCATION: Education details: PT plan of care; purpose of functional outcomes Person educated: Patient and Spouse Education method: Explanation and Verbal cues Education comprehension: verbalized understanding   HOME EXERCISE PROGRAM: Access Code: [email or web address] URL: [email or web address] Date: 04/20/2022 Prepared by: Rivka Barbara  Exercises - Sit to stand, walk with assistive device to another chair, sit and repeat.   - 1 x daily - 7 x weekly - 2 sets - 10 reps - Standing March with Counter Support  - 1 x daily - 7 x weekly - 3  sets - 10 reps - Standing in "bowling stance" with counter support   - 1 x daily - 7 x weekly - 3 sets - 10 reps - Heel Raises with Counter Support  - 1 x daily - 7 x weekly - 3 sets - 10 reps - Seated shoulder row while sitting on stability disc   - 1 x daily - 7 x weekly - 3 sets - 10 reps    GOALS: Goals reviewed with patient? Yes  SHORT TERM GOALS: Target date: 01/11/2022  Pt will be independent with HEP in order to improve strength and balance in order to decrease fall risk and improve function at home and work.  Baseline: Patient reports not performing much in the way of any HEP except walking. 12/27: Having phases of participation and phases of refusal to participate.  Goal status: IN PROGRESS  LONG TERM GOALS: Target date: 02/22/2022  Pt will decrease 5TSTS by at least 5 seconds in order to demonstrate clinically significant improvement in LE strength. Baseline: 02/28/2022: 19.5 sec with UE support  04/13/22:11.4 sec 05/25/22: 11.9 sec  Goal status: MET- continue to monitor for consistent progression   2.  Pt will improve FOTO to target score of 48 to display perceived improvements in ability to complete ADL's.  Baseline: 02/28/22=40 12/27: 45 Goal status: IN PROGRESS  3.  Pt will decrease TUG to < or = to 25 seconds/decrease in order to demonstrate decreased fall risk. Baseline: 02/28/22= 37.26 sec using upright 4WW 12/27:20.21 sec 05/25/22: 27.1 sec ( does not take brakes off without cues)  Goal status: IN PROGRESS Keep assessing to ensure progress is maintained  4.  Pt will increase 10MWT by at least 0.13 m/s in order to demonstrate clinically significant improvement in community ambulation.   Baseline: 02/28/22= 0.67 m/s using upright 4WW 12/27: .89 m/s  Goal status: MET  5.  Patient will increase six minute walk test distance to >1000 for progression to community ambulator and improve gait ability  Baseline: 02/28/22: 720 feet with up walker 04/13/22: 815 feet with up  walker 05/25/22: 877 feet with Up walker  Goal status: IN PROGRESS  6.  Patient will experience 1 fall or less per 2 week period for at least 3 weeks in order to improve his frequency of falls and prevent injury from falls  Baseline:04/13/22: Pt has had 1 fall in the last 2 weeks to his knowledge 05/25/22: 2 falls in last 2 weeks  Goal status: IN PROGRESS  ASSESSMENT:  CLINICAL IMPRESSION:  Pt shows good  motivation for completion of PT activities.Pt progresses with PWR moves in combination with dual task cognitive with continued frequent cues needed for proper performance. Pt challenged with gait with ankle weights and metronome this date and shows improved endurance with this task although he reports he is challenged. Pt encouraged to use PWR! Moves in daily routine to improve balance, transitions and safety with tasks. Pt still having falls that pose risk of significant injury. Pt will continue to benefit from skilled physical therapy intervention to address impairments, improve QOL, and attain therapy goals.      OBJECTIVE IMPAIRMENTS Abnormal gait, decreased activity tolerance, decreased balance, decreased cognition, decreased coordination, decreased endurance, decreased mobility, difficulty walking, decreased ROM, decreased strength, decreased safety awareness, hypomobility, impaired perceived functional ability, and pain.   ACTIVITY LIMITATIONS carrying, lifting, bending, standing, squatting, stairs, transfers, bed mobility, bathing, toileting, dressing, self feeding, hygiene/grooming, and caring for others  PARTICIPATION LIMITATIONS: meal prep, cleaning, laundry, medication management, personal finances, driving, shopping, community activity, and yard work  PERSONAL FACTORS Time since onset of injury/illness/exacerbation are also affecting patient's functional outcome.   REHAB POTENTIAL: Fair    CLINICAL DECISION MAKING: Evolving/moderate complexity  EVALUATION COMPLEXITY:  Moderate  PLAN: PT FREQUENCY: 2x/week  PT DURATION: 12 weeks  PLANNED INTERVENTIONS: Therapeutic exercises, Therapeutic activity, Neuromuscular re-education, Balance training, Gait training, Patient/Family education, Self Care, Joint mobilization, Joint manipulation, Stair training, Vestibular training, Canalith repositioning, DME instructions, Dry Needling, Electrical stimulation, Spinal manipulation, Spinal mobilization, Cryotherapy, Moist heat, Manual therapy, and Re-evaluation  PLAN FOR NEXT SESSION: Core strength, balance training, ambulatory endurance  12:55 PM, 06/29/22 Particia Lather PT ,DPT Physical Therapist- [facility]

## 2022-06-30 ENCOUNTER — Ambulatory Visit: Payer: Medicare PPO | Admitting: Dermatology

## 2022-07-04 ENCOUNTER — Ambulatory Visit: Payer: Medicare PPO | Admitting: Physical Therapy

## 2022-07-04 ENCOUNTER — Encounter: Payer: Self-pay | Admitting: Physical Therapy

## 2022-07-04 ENCOUNTER — Ambulatory Visit: Payer: Medicare PPO

## 2022-07-04 DIAGNOSIS — R269 Unspecified abnormalities of gait and mobility: Secondary | ICD-10-CM | POA: Diagnosis not present

## 2022-07-04 DIAGNOSIS — R262 Difficulty in walking, not elsewhere classified: Secondary | ICD-10-CM | POA: Diagnosis not present

## 2022-07-04 DIAGNOSIS — R296 Repeated falls: Secondary | ICD-10-CM

## 2022-07-04 DIAGNOSIS — R2681 Unsteadiness on feet: Secondary | ICD-10-CM

## 2022-07-04 DIAGNOSIS — G20A1 Parkinson's disease without dyskinesia, without mention of fluctuations: Secondary | ICD-10-CM | POA: Diagnosis not present

## 2022-07-04 NOTE — Therapy (Signed)
OUTPATIENT PHYSICAL THERAPY NEURO Treatment      Patient Name: [name] MRN: [id number] DOB:[birth year], 72 y.o., male Today's Date: 07/04/2022   PCP: Dr. Elsie Mcgee REFERRING PROVIDER: Dr. Sharolyn Mcgee   PT End of Session - 07/04/22 1056     Visit Number 27    Number of Visits 77    Date for PT Re-Evaluation 08/15/22    Authorization Type [employer] Medicare Choice PPO    Progress Note Due on Visit 30    PT Start Time 7893    PT Stop Time 1228    PT Time Calculation (min) 43 min    Equipment Utilized During Treatment Gait belt    Activity Tolerance Patient tolerated treatment well;No increased pain    Behavior During Therapy WFL for tasks assessed/performed                                 Past Medical History:  Diagnosis Date   Arthritis    Bradycardia    Cancer ([place]) 2013   skin cancer   Depression    ptsd   Dysrhythmia    chronic slow heart rate   GERD (gastroesophageal reflux disease)    Headache(784.0)    tension headaches non recent   History of chicken pox    History of kidney stones    passed   Hypertension    treated with HCTZ   Pacemaker    Parkinson's disease    dx'ed 15 years ago   PTSD (post-traumatic stress disorder)    Shortness of breath dyspnea    Sleep apnea    doesn't use C-pap   Varicose veins    Past Surgical History:  Procedure Laterality Date   CHOLECYSTECTOMY N/A 10/22/2014   Procedure: LAPAROSCOPIC CHOLECYSTECTOMY WITH INTRAOPERATIVE CHOLANGIOGRAM;  Surgeon: George Crawford III, MD;  Location: [facility] ORS;  Service: General;  Laterality: N/A;   cyst removed      from lip as a child   INTRAMEDULLARY (IM) NAIL INTERTROCHANTERIC N/A 02/18/2019   Procedure: INTRAMEDULLARY (IM) NAIL INTERTROCHANTRIC, RIGHT,;  Surgeon: George Park, MD;  Location: [facility] ORS;  Service: Orthopedics;  Laterality: N/A;   LUMBAR LAMINECTOMY/DECOMPRESSION MICRODISCECTOMY Bilateral 12/14/2012   Procedure: Bilateral lumbar three-four,  four-five decompressive laminotomy/foraminotomy;  Surgeon: George Pitter, MD;  Location: [place] [facility];  Service: Neurosurgery;  Laterality: Bilateral;   PULSE GENERATOR IMPLANT Bilateral 12/13/2013   Procedure: Bilateral implantable pulse generator placement;  Surgeon: George Levine, MD;  Location: [place] [facility];  Service: Neurosurgery;  Laterality: Bilateral;  Bilateral implantable pulse generator placement   skin cancer removed     from ears,   12 lft arm  rt leg 15   SUBTHALAMIC STIMULATOR BATTERY REPLACEMENT Bilateral 07/14/2017   Procedure: BILATERAL IMPLANTED PULSE GENERATOR CHANGE FOR DEEP BRAIN STIMULATOR;  Surgeon: George Levine, MD;  Location: [place];  Service: Neurosurgery;  Laterality: Bilateral;   SUBTHALAMIC STIMULATOR INSERTION Bilateral 12/06/2013   Procedure: SUBTHALAMIC STIMULATOR INSERTION;  Surgeon: George Levine, MD;  Location: [place] [facility]URO ORS;  Service: Neurosurgery;  Laterality: Bilateral;  Bilateral deep brain stimulator placement   Patient Active Problem List   Diagnosis Date Noted   Hematuria 10/10/2021   Sinus drainage 12/09/2020   Medicare annual wellness visit, subsequent 11/18/2020   Pacemaker    Dementia associated with Parkinson's disease ([place]) 06/17/2020   Vertigo 02/19/2020   Urine abnormality 02/19/2020   Dysphagia 02/23/2019   Fall at home 10/18/2017  REM behavioral disorder 11/02/2016   Radicular pain in right arm 10/09/2016   GERD (gastroesophageal reflux disease) 07/31/2016   Colon cancer screening 07/23/2015   Gout 02/18/2015   Depression 01/06/2015   Skin lesion 10/02/2014   Dupuytren's contracture 08/20/2014   Cough 07/21/2014   Lumbar stenosis with neurogenic claudication 06/13/2014   S/P deep brain stimulator placement 05/08/2014   Advance care planning 01/19/2014   Parkinson's disease 12/13/2013   PTSD (post-traumatic stress disorder) 06/13/2013   Erectile dysfunction 06/13/2013   HLD (hyperlipidemia) 06/13/2013   Essential hypertension  06/03/2013   Bradycardia by electrocardiogram 06/03/2013   Obstructive sleep apnea 03/12/2013    ONSET DATE: 15 years ago per chart  REFERRING DIAG: Parkinson's disease  THERAPY DIAG:  Abnormality of gait and mobility  Unsteadiness on feet  Difficulty in walking, not elsewhere classified  Repeated falls  Rationale for Evaluation and Treatment Rehabilitation  SUBJECTIVE:                                                                                                                                                                                              SUBJECTIVE STATEMENT:  Pt had a fishing trip day this weekend. He then proceeded to ambulate in yard without AD, walk on boards without AD resulting in multiple falls. Pt caregiver will not be allowing the person who was there to supervise again...  Pt accompanied by: [name] (caretaker)  PERTINENT HISTORY:   Date of most recent Admission: 09/25/2021 Date of Discharge: 09/28/2021  Discharge Diagnoses:  Altered mental status Parkinson disease (CMS/HCC) Hallucinations  Per [facility] discharge summary notes PHI : Hospital Course:   72 y.o. male with a history of Parkinson's Disease w/ DBS in place, PD-related dementia, OSA, depression, and seizure disorder who presented for EEG given concern for seizures.  Altered mental status Concern seizure activity Periods of unresponsiveness on the setting of new medication--Nuplazid (pimavanserin) recently started. No events of note on continuous EEG. This was held and he returned to his baseline mental status. Other workup including CT head, CTA h/n without obvious cause for periodic unresponsiveness. Nuplazid was discontinued.   Aspiration, weakness Of note, he had a coughing spell while eating though sounds like was precipitated by sneezing. Did well on RA, seen by SLP without any further needs or concerns.  Parkinson's Disease Parkinson's-related dementia - He follows in the  Movement Disorders center at [facility]. Clinic notes suggest he seems to have worsening of his overall disease trajectory. - Continued home Memantine and Sinemet   PMH: HTN, bradycardia s/p PPM, PD, DBS, COVID-19 infection, chronic low back pain s/p L4/5 surgery.  PAIN:  Are you having pain? Yes: NPRS scale: 6/10 Pain location: Low back Pain description: ache Aggravating factors: Prolonged standing/sitting, bending over Relieving factors: rest, changing positions   OBJECTIVE: (objective measures completed at initial evaluation unless otherwise dated)     INTERVENTION THIS DATE:  TA:  Ambulation with up walker to waiting area in medical arts and back . Pt had slower gait this date compared to last.    PWR! Up with PWR! Step To ea side with reaching and stepping toward walking placed on side of patient x 10 reps working on dual task and functional movements like reaching for walker or walking aid.  Poor recall of task at hand ( many reps did not step just reaching despite multiple cues  -x10 to ea side   Precor leg press   -10 reps x 85 # x 3 sets  -10 reps at 100 #  Seated on dynadisc GTB rows 2 x 20 rep, cues for form           PATIENT EDUCATION: Education details: PT plan of care; purpose of functional outcomes Person educated: Patient and Spouse Education method: Explanation and Verbal cues Education comprehension: verbalized understanding   HOME EXERCISE PROGRAM: Access Code: [email or web address] URL: [email or web address] Date: 04/20/2022 Prepared by: Rivka Barbara  Exercises - Sit to stand, walk with assistive device to another chair, sit and repeat.   - 1 x daily - 7 x weekly - 2 sets - 10 reps - Standing March with Counter Support  - 1 x daily - 7 x weekly - 3 sets - 10 reps - Standing in "bowling stance" with counter support   - 1 x daily - 7 x weekly - 3 sets - 10 reps - Heel Raises with Counter Support  - 1 x daily - 7 x weekly - 3 sets - 10  reps - Seated shoulder row while sitting on stability disc   - 1 x daily - 7 x weekly - 3 sets - 10 reps    GOALS: Goals reviewed with patient? Yes  SHORT TERM GOALS: Target date: 01/11/2022  Pt will be independent with HEP in order to improve strength and balance in order to decrease fall risk and improve function at home and work.  Baseline: Patient reports not performing much in the way of any HEP except walking. 12/27: Having phases of participation and phases of refusal to participate.  Goal status: IN PROGRESS  LONG TERM GOALS: Target date: 02/22/2022  Pt will decrease 5TSTS by at least 5 seconds in order to demonstrate clinically significant improvement in LE strength. Baseline: 02/28/2022: 19.5 sec with UE support  04/13/22:11.4 sec 05/25/22: 11.9 sec  Goal status: MET- continue to monitor for consistent progression   2.  Pt will improve FOTO to target score of 48 to display perceived improvements in ability to complete ADL's.  Baseline: 02/28/22=40 12/27: 45 Goal status: IN PROGRESS  3.  Pt will decrease TUG to < or = to 25 seconds/decrease in order to demonstrate decreased fall risk. Baseline: 02/28/22= 37.26 sec using upright 4WW 12/27:20.21 sec 05/25/22: 27.1 sec ( does not take brakes off without cues)  Goal status: IN PROGRESS Keep assessing to ensure progress is maintained   4.  Pt will increase 10MWT by at least 0.13 m/s in order to demonstrate clinically significant improvement in community ambulation.   Baseline: 02/28/22= 0.67 m/s using upright 4WW 12/27: .89 m/s  Goal status: MET  5.  Patient will increase six minute  walk test distance to >1000 for progression to community ambulator and improve gait ability  Baseline: 02/28/22: 720 feet with up walker 04/13/22: 815 feet with up walker 05/25/22: 877 feet with Up walker  Goal status: IN PROGRESS  6.  Patient will experience 1 fall or less per 2 week period for at least 3 weeks in order to improve his frequency of falls  and prevent injury from falls  Baseline:04/13/22: Pt has had 1 fall in the last 2 weeks to his knowledge 05/25/22: 2 falls in last 2 weeks  Goal status: IN PROGRESS  ASSESSMENT:  CLINICAL IMPRESSION:  Pt presents with fair motivation for completion of therapy activities. Pt shows decreased ability to focus on tasks this session but still puts forth good effort.  Pt still having falls that pose risk of significant injury. Pt will continue to benefit from skilled physical therapy intervention to address impairments, improve QOL, and attain therapy goals.      OBJECTIVE IMPAIRMENTS Abnormal gait, decreased activity tolerance, decreased balance, decreased cognition, decreased coordination, decreased endurance, decreased mobility, difficulty walking, decreased ROM, decreased strength, decreased safety awareness, hypomobility, impaired perceived functional ability, and pain.   ACTIVITY LIMITATIONS carrying, lifting, bending, standing, squatting, stairs, transfers, bed mobility, bathing, toileting, dressing, self feeding, hygiene/grooming, and caring for others  PARTICIPATION LIMITATIONS: meal prep, cleaning, laundry, medication management, personal finances, driving, shopping, community activity, and yard work  PERSONAL FACTORS Time since onset of injury/illness/exacerbation are also affecting patient's functional outcome.   REHAB POTENTIAL: Fair    CLINICAL DECISION MAKING: Evolving/moderate complexity  EVALUATION COMPLEXITY: Moderate  PLAN: PT FREQUENCY: 2x/week  PT DURATION: 12 weeks  PLANNED INTERVENTIONS: Therapeutic exercises, Therapeutic activity, Neuromuscular re-education, Balance training, Gait training, Patient/Family education, Self Care, Joint mobilization, Joint manipulation, Stair training, Vestibular training, Canalith repositioning, DME instructions, Dry Needling, Electrical stimulation, Spinal manipulation, Spinal mobilization, Cryotherapy, Moist heat, Manual therapy, and  Re-evaluation  PLAN FOR NEXT SESSION: Core strength, balance training, ambulatory endurance  2:08 PM, 07/04/22 Particia Lather PT ,DPT Physical Therapist- [facility]

## 2022-07-05 ENCOUNTER — Ambulatory Visit (INDEPENDENT_AMBULATORY_CARE_PROVIDER_SITE_OTHER): Payer: Medicare PPO | Admitting: Psychology

## 2022-07-05 DIAGNOSIS — F4323 Adjustment disorder with mixed anxiety and depressed mood: Secondary | ICD-10-CM

## 2022-07-05 DIAGNOSIS — F331 Major depressive disorder, recurrent, moderate: Secondary | ICD-10-CM

## 2022-07-06 ENCOUNTER — Ambulatory Visit: Payer: Medicare PPO

## 2022-07-06 ENCOUNTER — Ambulatory Visit: Payer: Medicare PPO | Admitting: Physical Therapy

## 2022-07-06 NOTE — Progress Notes (Signed)
[facility] Counselor/Therapist Progress Note  Patient ID: [name], MRN: [id number],    Date: 07/05/2022  Time Spent: 45 minutes  Treatment Type: Indivdual  Reported Symptoms: sadness, and boredom  Mental Status Exam: Appearance:  Casual     Behavior: normal  Motor: Psychomotor Retardation  Speech/Language:  Slow  Affect: flat  Mood: normal  Thought process: concrete  Thought content:   WNL  Sensory/Perceptual disturbances:   WNL  Orientation: oriented to person, place, time/date, and situation  Attention: Fair  Concentration: Fair  Memory: Immediate;   [place] of knowledge:  Fair  Insight:   Fair  Judgment:  Fair  Impulse Control: Fair   Risk Assessment: Danger to Self:  No Self-injurious Behavior: No Danger to Others: No Duty to Warn:no Physical Aggression / Violence:No  Access to Firearms a concern: No  Gang Involvement:No   Subjective: The patient attended a face-to-face individual therapy session  via video visit today.  The patient gave verbal consent for the video to be on WebEx.  The patient was in his home  and therapist was in the office.  The patient presents with a pleasant affect and mood is pleasant.  The patient reports that he feels okay.  He states that he went fishing recently and was a little upset by it because he fell down twice.  The patient reports that he is continuing to get services in his home and that is helpful for he and his wife.  The patient does not seem to have a lot of motivation to do anything.  We talked about exploring options so that he could do more things that he enjoys.  He did report that his family is coming over for Easter and seems happy about that.  He denies any strong depression.  Interventions: Ego-Supportive, CBT  Diagnosis:Major depressive disorder, recurrent episode, moderate (HCC)  Adjustment disorder with mixed anxiety and depressed mood  Plan: Client Abilities/Strengths  Pt is bright and  has a good sense of humor.  Client Treatment Preferences  Individual therapy.  Client Statement of Needs  improve coping skills regarding chronic illness.  Treatment Level  Outpatient Individual therapy  Symptoms  Depressed or irritable mood.: No Description Entered (Status: improved). Diminished interest in or  enjoyment of activities.: No Description Entered (Status: maintained). Feelings of hopelessness,  worthlessness, or inappropriate guilt.: No Description Entered (Status: improved). Lack of energy.: No  Description Entered (Status: maintained). Psychomotor agitation or retardation.: No Description  Entered (Status: maintained). Social withdrawal.: No Description Entered (Status: improved).  Problems Addressed  Unipolar Depression, Unipolar Depression, Unipolar Depression, Unipolar Depression  Goals 1. Alleviate depressive symptoms and return to previous level of effective  functioning. 2. Develop healthy interpersonal relationships that lead to the alleviation  and help prevent the relapse of depression. Objective Describe current and past experiences with depression including their impact on functioning and  attempts to resolve it. Target Date: 2023-05-01 Frequency: Monthly Progress: 60 Modality: individual Related Interventions 1. Encourage the client to share his/her thoughts and feelings of depression; express empathy and  build rapport while identifying primary cognitive, behavioral, interpersonal, or other  contributors to depression. Objective Learn and implement behavioral strategies to overcome depression. Target Date: 2023-05-01 Frequency: Monthly Progress: 80 Modality: individual Objective Identify and replace thoughts and beliefs that support depression. Target Date: 2023-05-01 Frequency: Monthly Progress: 70 Modality: individual Related Interventions 1. Conduct Cognitive-Behavioral Therapy (see Cognitive Behavior Therapy by Olevia Bowens; Overcoming Depression by  Lynita Lombard al.),  beginning with helping the client learn the connection among  cognition, depressive feelings, and actions. 2. Explore and restructure underlying assumptions and beliefs reflected in biased self-talk that  may put the client at risk for relapse or recurrence. 3. Facilitate and reinforce the client's shift from biased depressive self-talk and beliefs to realitybased cognitive messages that enhance self-confidence and increase adaptive actions (see  "Positive Self-Talk" in the Adult Psychotherapy Homework Planner by Bryn Gulling). Objective Learn and implement problem-solving and decision-making skills. Target Date: 2023-05-01 Frequency: Monthly Progress: 70 Modality: individual Related Interventions 1. Encourage in the client the development of a positive problem orientation in which problems  and solving them are viewed as a natural part of life and not something to be feared, despaired,  or avoided. 2. Conduct Problem-Solving Therapy (see Problem-Solving Therapy by Shawnee Knapp and Delane Ginger) using techniques such as psychoeducation, modeling, and role-playing to teach client problem-solving skills (i.e., defining a problem specifically, generating possible solutions, evaluating the pros  and cons of each solution, selecting and implementing a plan of action, evaluating the efficacy  of the plan, accepting or revising the plan); role-play application of the problem-solving skill to  a real life issue (or assign "Applying Problem-Solving to Interpersonal Conflict" in the Adult  Psychotherapy Homework Planner by Bryn Gulling). 3. Develop healthy thinking patterns and beliefs about self, others, and the world that lead to the alleviation and help prevent the relapse of  depression. 4. Recognize, accept, and cope with feelings of depression. Diagnosis Axis  none F43.21 (Adjustment Disorder, With depressed mood) - Open -  [Signifier: n/a]  Adjustment Disorder,  With Depressed Mood  Axis   none 296.32 (Major depressive affective disorder, recurrent episode,  moderate) - Open - [Signifier: n/a]  Conditions For Discharge Achievement of treatment goals and objectives   Plan is to continue to offer supportive therapy to patient to help him deal with his depression related to his Parkinsons diagnosis.  Patient approved Treatment Plan   Anjolaoluwa Siguenza Rejeana Brock, LCSW

## 2022-07-07 NOTE — Progress Notes (Signed)
Remote pacemaker transmission.   

## 2022-07-11 ENCOUNTER — Ambulatory Visit: Payer: Medicare PPO | Admitting: Physical Therapy

## 2022-07-11 ENCOUNTER — Ambulatory Visit: Payer: Medicare PPO

## 2022-07-11 DIAGNOSIS — G20A1 Parkinson's disease without dyskinesia, without mention of fluctuations: Secondary | ICD-10-CM | POA: Diagnosis not present

## 2022-07-11 DIAGNOSIS — R269 Unspecified abnormalities of gait and mobility: Secondary | ICD-10-CM | POA: Diagnosis not present

## 2022-07-11 DIAGNOSIS — R262 Difficulty in walking, not elsewhere classified: Secondary | ICD-10-CM

## 2022-07-11 DIAGNOSIS — R296 Repeated falls: Secondary | ICD-10-CM | POA: Diagnosis not present

## 2022-07-11 DIAGNOSIS — R2681 Unsteadiness on feet: Secondary | ICD-10-CM

## 2022-07-11 NOTE — Therapy (Signed)
OUTPATIENT PHYSICAL THERAPY NEURO Treatment      Patient Name: [name] MRN: [id number] DOB:1950/06/24, 72 y.o., male Today's Date: 07/11/2022   PCP: Dr. Elsie Stain REFERRING PROVIDER: Dr. Sharolyn Douglas   PT End of Session - 07/11/22 1123     Visit Number 28    Number of Visits 7    Date for PT Re-Evaluation 08/15/22    Authorization Type [employer] Medicare Choice PPO    Progress Note Due on Visit 30    PT Start Time 1125    PT Stop Time 1208    PT Time Calculation (min) 43 min    Equipment Utilized During Treatment Gait belt    Activity Tolerance Patient tolerated treatment well;No increased pain    Behavior During Therapy WFL for tasks assessed/performed                                 Past Medical History:  Diagnosis Date   Arthritis    Bradycardia    Cancer ([place]) 2013   skin cancer   Depression    ptsd   Dysrhythmia    chronic slow heart rate   GERD (gastroesophageal reflux disease)    Headache(784.0)    tension headaches non recent   History of chicken pox    History of kidney stones    passed   Hypertension    treated with HCTZ   Pacemaker    Parkinson's disease    dx'ed 15 years ago   PTSD (post-traumatic stress disorder)    Shortness of breath dyspnea    Sleep apnea    doesn't use C-pap   Varicose veins    Past Surgical History:  Procedure Laterality Date   CHOLECYSTECTOMY N/A 10/22/2014   Procedure: LAPAROSCOPIC CHOLECYSTECTOMY WITH INTRAOPERATIVE CHOLANGIOGRAM;  Surgeon: Dia Crawford III, MD;  Location: [facility] ORS;  Service: General;  Laterality: N/A;   cyst removed      from lip as a child   INTRAMEDULLARY (IM) NAIL INTERTROCHANTERIC N/A 02/18/2019   Procedure: INTRAMEDULLARY (IM) NAIL INTERTROCHANTRIC, RIGHT,;  Surgeon: Thornton Park, MD;  Location: [facility] ORS;  Service: Orthopedics;  Laterality: N/A;   LUMBAR LAMINECTOMY/DECOMPRESSION MICRODISCECTOMY Bilateral 12/14/2012   Procedure: Bilateral lumbar three-four,  four-five decompressive laminotomy/foraminotomy;  Surgeon: Charlie Pitter, MD;  Location: [place] [facility];  Service: Neurosurgery;  Laterality: Bilateral;   PULSE GENERATOR IMPLANT Bilateral 12/13/2013   Procedure: Bilateral implantable pulse generator placement;  Surgeon: Erline Levine, MD;  Location: [place] [facility];  Service: Neurosurgery;  Laterality: Bilateral;  Bilateral implantable pulse generator placement   skin cancer removed     from ears,   12 lft arm  rt leg 15   SUBTHALAMIC STIMULATOR BATTERY REPLACEMENT Bilateral 07/14/2017   Procedure: BILATERAL IMPLANTED PULSE GENERATOR CHANGE FOR DEEP BRAIN STIMULATOR;  Surgeon: Erline Levine, MD;  Location: [facility];  Service: Neurosurgery;  Laterality: Bilateral;   SUBTHALAMIC STIMULATOR INSERTION Bilateral 12/06/2013   Procedure: SUBTHALAMIC STIMULATOR INSERTION;  Surgeon: Erline Levine, MD;  Location: [place] [facility];  Service: Neurosurgery;  Laterality: Bilateral;  Bilateral deep brain stimulator placement   Patient Active Problem List   Diagnosis Date Noted   Hematuria 10/10/2021   Sinus drainage 12/09/2020   Medicare annual wellness visit, subsequent 11/18/2020   Pacemaker    Dementia associated with Parkinson's disease ([place]) 06/17/2020   Vertigo 02/19/2020   Urine abnormality 02/19/2020   Dysphagia 02/23/2019   Fall at home 10/18/2017  REM behavioral disorder 11/02/2016   Radicular pain in right arm 10/09/2016   GERD (gastroesophageal reflux disease) 07/31/2016   Colon cancer screening 07/23/2015   Gout 02/18/2015   Depression 01/06/2015   Skin lesion 10/02/2014   Dupuytren's contracture 08/20/2014   Cough 07/21/2014   Lumbar stenosis with neurogenic claudication 06/13/2014   S/P deep brain stimulator placement 05/08/2014   Advance care planning 01/19/2014   Parkinson's disease 12/13/2013   PTSD (post-traumatic stress disorder) 06/13/2013   Erectile dysfunction 06/13/2013   HLD (hyperlipidemia) 06/13/2013   Essential hypertension  06/03/2013   Bradycardia by electrocardiogram 06/03/2013   Obstructive sleep apnea 03/12/2013    ONSET DATE: 15 years ago per chart  REFERRING DIAG: Parkinson's disease  THERAPY DIAG:  Abnormality of gait and mobility  Unsteadiness on feet  Difficulty in walking, not elsewhere classified  Repeated falls  Rationale for Evaluation and Treatment Rehabilitation  SUBJECTIVE:                                                                                                                                                                                              SUBJECTIVE STATEMENT:  Pt report no changes or falls since last session. Pt reports overall he is doing well.   Pt accompanied by: Caretaker ( hired)   PERTINENT HISTORY:   Date of most recent Admission: 09/25/2021 Date of Discharge: 09/28/2021  Discharge Diagnoses:  Altered mental status Parkinson disease (CMS/HCC) Hallucinations  Per [facility] discharge summary notes PHI : Hospital Course:   72 y.o. male with a history of Parkinson's Disease w/ DBS in place, PD-related dementia, OSA, depression, and seizure disorder who presented for EEG given concern for seizures.  Altered mental status Concern seizure activity Periods of unresponsiveness on the setting of new medication--Nuplazid (pimavanserin) recently started. No events of note on continuous EEG. This was held and he returned to his baseline mental status. Other workup including CT head, CTA h/n without obvious cause for periodic unresponsiveness. Nuplazid was discontinued.   Aspiration, weakness Of note, he had a coughing spell while eating though sounds like was precipitated by sneezing. Did well on RA, seen by SLP without any further needs or concerns.  Parkinson's Disease Parkinson's-related dementia - He follows in the [facility] Disorders center at [facility]. Clinic notes suggest he seems to have worsening of his overall disease trajectory. - Continued home  Memantine and Sinemet   PMH: HTN, bradycardia s/p PPM, PD, DBS, COVID-19 infection, chronic low back pain s/p L4/5 surgery.  PAIN:  Are you having pain? Yes: NPRS scale: 6/10 Pain location: Low back Pain description: ache Aggravating factors: Prolonged  standing/sitting, bending over Relieving factors: rest, changing positions   OBJECTIVE: (objective measures completed at initial evaluation unless otherwise dated)     INTERVENTION THIS DATE:   TA: Unless otherwise stated, CGA was provided and gait belt donned in order to ensure pt safety   Ambulation with [facility] and back . Pt had slower gait this date compared to last.    PWR! Up with PWR! Step To ea side with reaching and stepping  -5 to ea side, near fall post with max A from PT to prevent fall.  - Pt near fall caused by large weight shift without stepping causing COM to leave BOS and pt unable to correct, pt educating regarding improvement of taking step and keep COM within BOS.  - cues for 3 points of contact with the ground of her foot ( 1st and 5th digit and heel as pt tends to not have toes on ground causing post LOB and weight shift)   Transitioned to [facility]! Step only with focus on speed and amplitude of movement x 10 reps to ea side, 1 post LOB corrected with ant weight shift and CGA and cues from PT.   Precor leg press   -10 reps x 85 # x 1 sets  -10 reps at 100 # x 3 sets   Matrix cable system rows  X 10 with 22.5# - easy with good form  2 x 10 with 32.5#      PATIENT EDUCATION: Education details: PT plan of care; purpose of functional outcomes Person educated: Patient and Spouse Education method: Explanation and Verbal cues Education comprehension: verbalized understanding   HOME EXERCISE PROGRAM: Access Code: [email or web address] URL: [email or web address] Date: 04/20/2022 Prepared by: Rivka Barbara  Exercises - Sit to stand, walk with assistive device to  another chair, sit and repeat.   - 1 x daily - 7 x weekly - 2 sets - 10 reps - Standing March with Counter Support  - 1 x daily - 7 x weekly - 3 sets - 10 reps - Standing in "bowling stance" with counter support   - 1 x daily - 7 x weekly - 3 sets - 10 reps - Heel Raises with Counter Support  - 1 x daily - 7 x weekly - 3 sets - 10 reps - Seated shoulder row while sitting on stability disc   - 1 x daily - 7 x weekly - 3 sets - 10 reps    GOALS: Goals reviewed with patient? Yes  SHORT TERM GOALS: Target date: 01/11/2022  Pt will be independent with HEP in order to improve strength and balance in order to decrease fall risk and improve function at home and work.  Baseline: Patient reports not performing much in the way of any HEP except walking. 12/27: Having phases of participation and phases of refusal to participate.  Goal status: IN PROGRESS  LONG TERM GOALS: Target date: 02/22/2022  Pt will decrease 5TSTS by at least 5 seconds in order to demonstrate clinically significant improvement in LE strength. Baseline: 02/28/2022: 19.5 sec with UE support  04/13/22:11.4 sec 05/25/22: 11.9 sec  Goal status: MET- continue to monitor for consistent progression   2.  Pt will improve FOTO to target score of 48 to display perceived improvements in ability to complete ADL's.  Baseline: 02/28/22=40 12/27: 45 Goal status: IN PROGRESS  3.  Pt will decrease TUG to < or = to 25 seconds/decrease in order to demonstrate decreased fall risk. Baseline:  02/28/22= 37.26 sec using upright 4WW 12/27:20.21 sec 05/25/22: 27.1 sec ( does not take brakes off without cues)  Goal status: IN PROGRESS Keep assessing to ensure progress is maintained   4.  Pt will increase 10MWT by at least 0.13 m/s in order to demonstrate clinically significant improvement in community ambulation.   Baseline: 02/28/22= 0.67 m/s using upright 4WW 12/27: .89 m/s  Goal status: MET  5.  Patient will increase six minute walk test distance to  >1000 for progression to community ambulator and improve gait ability  Baseline: 02/28/22: 720 feet with up walker 04/13/22: 815 feet with up walker 05/25/22: 877 feet with Up walker  Goal status: IN PROGRESS  6.  Patient will experience 1 fall or less per 2 week period for at least 3 weeks in order to improve his frequency of falls and prevent injury from falls  Baseline:04/13/22: Pt has had 1 fall in the last 2 weeks to his knowledge 05/25/22: 2 falls in last 2 weeks  Goal status: IN PROGRESS  ASSESSMENT:  CLINICAL IMPRESSION:  Pt presents with fair motivation for completion of therapy activities. Pt progressing with ambulation, strength, and endurance but still has difficulties with sequencing of activities and balancing in these situations. Pt will continue to benefit from skilled physical therapy intervention to address impairments, improve QOL, and attain therapy goals.       OBJECTIVE IMPAIRMENTS Abnormal gait, decreased activity tolerance, decreased balance, decreased cognition, decreased coordination, decreased endurance, decreased mobility, difficulty walking, decreased ROM, decreased strength, decreased safety awareness, hypomobility, impaired perceived functional ability, and pain.   ACTIVITY LIMITATIONS carrying, lifting, bending, standing, squatting, stairs, transfers, bed mobility, bathing, toileting, dressing, self feeding, hygiene/grooming, and caring for others  PARTICIPATION LIMITATIONS: meal prep, cleaning, laundry, medication management, personal finances, driving, shopping, community activity, and yard work  PERSONAL FACTORS Time since onset of injury/illness/exacerbation are also affecting patient's functional outcome.   REHAB POTENTIAL: Fair    CLINICAL DECISION MAKING: Evolving/moderate complexity  EVALUATION COMPLEXITY: Moderate  PLAN: PT FREQUENCY: 2x/week  PT DURATION: 12 weeks  PLANNED INTERVENTIONS: Therapeutic exercises, Therapeutic activity,  Neuromuscular re-education, Balance training, Gait training, Patient/Family education, Self Care, Joint mobilization, Joint manipulation, Stair training, Vestibular training, Canalith repositioning, DME instructions, Dry Needling, Electrical stimulation, Spinal manipulation, Spinal mobilization, Cryotherapy, Moist heat, Manual therapy, and Re-evaluation  PLAN FOR NEXT SESSION: Core strength, balance training, ambulatory endurance  12:21 PM, 07/11/22 Particia Lather PT ,DPT Physical Therapist- [facility]

## 2022-07-13 ENCOUNTER — Encounter: Payer: Self-pay | Admitting: Physical Therapy

## 2022-07-13 ENCOUNTER — Ambulatory Visit: Payer: Medicare PPO | Admitting: Physical Therapy

## 2022-07-13 ENCOUNTER — Ambulatory Visit: Payer: Medicare PPO

## 2022-07-13 DIAGNOSIS — R269 Unspecified abnormalities of gait and mobility: Secondary | ICD-10-CM | POA: Diagnosis not present

## 2022-07-13 DIAGNOSIS — R2681 Unsteadiness on feet: Secondary | ICD-10-CM | POA: Diagnosis not present

## 2022-07-13 DIAGNOSIS — R296 Repeated falls: Secondary | ICD-10-CM | POA: Diagnosis not present

## 2022-07-13 DIAGNOSIS — G20A1 Parkinson's disease without dyskinesia, without mention of fluctuations: Secondary | ICD-10-CM | POA: Diagnosis not present

## 2022-07-13 DIAGNOSIS — R262 Difficulty in walking, not elsewhere classified: Secondary | ICD-10-CM | POA: Diagnosis not present

## 2022-07-13 NOTE — Therapy (Signed)
OUTPATIENT PHYSICAL THERAPY NEURO Treatment      Patient Name: [name] MRN: [id number] DOB:Sep 15, 1950, 72 y.o., male Today's Date: 07/13/2022   PCP: Dr. Elsie Stain REFERRING PROVIDER: Dr. Sharolyn Douglas   PT End of Session - 07/13/22 1147     Visit Number 29    Number of Visits 34    Date for PT Re-Evaluation 08/15/22    Authorization Type [employer] Medicare Choice PPO    Progress Note Due on Visit 30    PT Start Time 1149    PT Stop Time 1229    PT Time Calculation (min) 40 min    Equipment Utilized During Treatment Gait belt    Activity Tolerance Patient tolerated treatment well;No increased pain    Behavior During Therapy WFL for tasks assessed/performed                                  Past Medical History:  Diagnosis Date   Arthritis    Bradycardia    Cancer ([place]) 2013   skin cancer   Depression    ptsd   Dysrhythmia    chronic slow heart rate   GERD (gastroesophageal reflux disease)    Headache(784.0)    tension headaches non recent   History of chicken pox    History of kidney stones    passed   Hypertension    treated with HCTZ   Pacemaker    Parkinson's disease    dx'ed 15 years ago   PTSD (post-traumatic stress disorder)    Shortness of breath dyspnea    Sleep apnea    doesn't use C-pap   Varicose veins    Past Surgical History:  Procedure Laterality Date   CHOLECYSTECTOMY N/A 10/22/2014   Procedure: LAPAROSCOPIC CHOLECYSTECTOMY WITH INTRAOPERATIVE CHOLANGIOGRAM;  Surgeon: Dia Crawford III, MD;  Location: [facility] ORS;  Service: General;  Laterality: N/A;   cyst removed      from lip as a child   INTRAMEDULLARY (IM) NAIL INTERTROCHANTERIC N/A 02/18/2019   Procedure: INTRAMEDULLARY (IM) NAIL INTERTROCHANTRIC, RIGHT,;  Surgeon: Thornton Park, MD;  Location: [facility]S;  Service: Orthopedics;  Laterality: N/A;   LUMBAR LAMINECTOMY/DECOMPRESSION MICRODISCECTOMY Bilateral 12/14/2012   Procedure: Bilateral lumbar three-four,  four-five decompressive laminotomy/foraminotomy;  Surgeon: Charlie Pitter, MD;  Location: [place] [facility];  Service: Neurosurgery;  Laterality: Bilateral;   PULSE GENERATOR IMPLANT Bilateral 12/13/2013   Procedure: Bilateral implantable pulse generator placement;  Surgeon: Erline Levine, MD;  Location: [place] [facility];  Service: Neurosurgery;  Laterality: Bilateral;  Bilateral implantable pulse generator placement   skin cancer removed     from ears,   12 lft arm  rt leg 15   SUBTHALAMIC STIMULATOR BATTERY REPLACEMENT Bilateral 07/14/2017   Procedure: BILATERAL IMPLANTED PULSE GENERATOR CHANGE FOR DEEP BRAIN STIMULATOR;  Surgeon: Erline Levine, MD;  Location: [place];  Service: Neurosurgery;  Laterality: Bilateral;   SUBTHALAMIC STIMULATOR INSERTION Bilateral 12/06/2013   Procedure: SUBTHALAMIC STIMULATOR INSERTION;  Surgeon: Erline Levine, MD;  Location: [place] [facility]UR[facility];  Service: Neurosurgery;  Laterality: Bilateral;  Bilateral deep brain stimulator placement   Patient Active Problem List   Diagnosis Date Noted   Hematuria 10/10/2021   Sinus drainage 12/09/2020   Medicare annual wellness visit, subsequent 11/18/2020   Pacemaker    Dementia associated with Parkinson's disease ([place]) 06/17/2020   Vertigo 02/19/2020   Urine abnormality 02/19/2020   Dysphagia 02/23/2019   Fall at home  10/18/2017   REM behavioral disorder 11/02/2016   Radicular pain in right arm 10/09/2016   GERD (gastroesophageal reflux disease) 07/31/2016   Colon cancer screening 07/23/2015   Gout 02/18/2015   Depression 01/06/2015   Skin lesion 10/02/2014   Dupuytren's contracture 08/20/2014   Cough 07/21/2014   Lumbar stenosis with neurogenic claudication 06/13/2014   S/P deep brain stimulator placement 05/08/2014   Advance care planning 01/19/2014   Parkinson's disease 12/13/2013   PTSD (post-traumatic stress disorder) 06/13/2013   Erectile dysfunction 06/13/2013   HLD (hyperlipidemia) 06/13/2013   Essential hypertension  06/03/2013   Bradycardia by electrocardiogram 06/03/2013   Obstructive sleep apnea 03/12/2013    ONSET DATE: 15 years ago per chart  REFERRING DIAG: Parkinson's disease  THERAPY DIAG:  Abnormality of gait and mobility  Unsteadiness on feet  Difficulty in walking, not elsewhere classified  Repeated falls  Rationale for Evaluation and Treatment Rehabilitation  SUBJECTIVE:                                                                                                                                                                                              SUBJECTIVE STATEMENT:  Pt report no changes or falls since last session. Pt reports overall he is doing well. Plan to assess for discharge depending on goals and pt caregiver input next visit.   Pt accompanied by: Caretaker ( hired)   PERTINENT HISTORY:   Date of most recent Admission: 09/25/2021 Date of Discharge: 09/28/2021  Discharge Diagnoses:  Altered mental status Parkinson disease (CMS/HCC) Hallucinations  Per [facility] discharge summary notes PHI : Hospital Course:   72 y.o. male with a history of Parkinson's Disease w/ DBS in place, PD-related dementia, OSA, depression, and seizure disorder who presented for EEG given concern for seizures.  Altered mental status Concern seizure activity Periods of unresponsiveness on the setting of new medication--Nuplazid (pimavanserin) recently started. No events of note on continuous EEG. This was held and he returned to his baseline mental status. Other workup including CT head, CTA h/n without obvious cause for periodic unresponsiveness. Nuplazid was discontinued.   Aspiration, weakness Of note, he had a coughing spell while eating though sounds like was precipitated by sneezing. Did well on RA, seen by SLP without any further needs or concerns.  Parkinson's Disease Parkinson's-related dementia - He follows in the Movement Disorders center at [facility]. Clinic notes suggest he  seems to have worsening of his overall disease trajectory. - Continued home Memantine and Sinemet   PMH: HTN, bradycardia s/p PPM, PD, DBS, COVID-19 infection, chronic low back pain s/p L4/5 surgery.  PAIN:  Are  you having pain? Yes: NPRS scale: 6/10 Pain location: Low back Pain description: ache Aggravating factors: Prolonged standing/sitting, bending over Relieving factors: rest, changing positions   OBJECTIVE: (objective measures completed at initial evaluation unless otherwise dated)     INTERVENTION THIS DATE:   TA: Unless otherwise stated, CGA was provided and gait belt donned in order to ensure pt safety   Ambulation with up walk[facility] to [facility] and back with 2# AW . 75 BPM on metronome and pt challenged to keep pace with this. Improved foot clearance this date compared to last session.    10 x sit to stand PWR! Up.   PWR! Step x 10 to ea side in standing  -second set did random PRW! Step chosen by PT each rep to challenge reactive balance x 14 reps   TE  Precor leg press   -15 reps x 85 # x 1 sets  -10 reps at 100 # x 3 sets   Matrix cable system rows  Matrix cable rows 2 x 12 with 12.5# in ea UE       PATIENT EDUCATION: Education details: PT plan of care; purpose of functional outcomes Person educated: Patient and Spouse Education method: Explanation and Verbal cues Education comprehension: verbalized understanding   HOME EXERCISE PROGRAM: Access Code: [email or web address] URL: [email or web address] Date: 04/20/2022 Prepared by: Rivka Barbara  Exercises - Sit to stand, walk with assistive device to another chair, sit and repeat.   - 1 x daily - 7 x weekly - 2 sets - 10 reps - Standing March with Counter Support  - 1 x daily - 7 x weekly - 3 sets - 10 reps - Standing in "bowling stance" with counter support   - 1 x daily - 7 x weekly - 3 sets - 10 reps - Heel Raises with Counter Support  - 1 x daily - 7 x weekly - 3 sets - 10  reps - Seated shoulder row while sitting on stability disc   - 1 x daily - 7 x weekly - 3 sets - 10 reps    GOALS: Goals reviewed with patient? Yes  SHORT TERM GOALS: Target date: 01/11/2022  Pt will be independent with HEP in order to improve strength and balance in order to decrease fall risk and improve function at home and work.  Baseline: Patient reports not performing much in the way of any HEP except walking. 12/27: Having phases of participation and phases of refusal to participate.  Goal status: IN PROGRESS  LONG TERM GOALS: Target date: 02/22/2022  Pt will decrease 5TSTS by at least 5 seconds in order to demonstrate clinically significant improvement in LE strength. Baseline: 02/28/2022: 19.5 sec with UE support  04/13/22:11.4 sec 05/25/22: 11.9 sec  Goal status: MET- continue to monitor for consistent progression   2.  Pt will improve FOTO to target score of 48 to display perceived improvements in ability to complete ADL's.  Baseline: 02/28/22=40 12/27: 45 Goal status: IN PROGRESS  3.  Pt will decrease TUG to < or = to 25 seconds/decrease in order to demonstrate decreased fall risk. Baseline: 02/28/22= 37.26 sec using upright 4WW 12/27:20.21 sec 05/25/22: 27.1 sec ( does not take brakes off without cues)  Goal status: IN PROGRESS Keep assessing to ensure progress is maintained   4.  Pt will increase 10MWT by at least 0.13 m/s in order to demonstrate clinically significant improvement in community ambulation.   Baseline: 02/28/22= 0.67 m/s using upright 4WW 12/27: .89  m/s  Goal status: MET  5.  Patient will increase six minute walk test distance to >1000 for progression to community ambulator and improve gait ability  Baseline: 02/28/22: 720 feet with up walker 04/13/22: 815 feet with up walker 05/25/22: 877 feet with Up walker  Goal status: IN PROGRESS  6.  Patient will experience 1 fall or less per 2 week period for at least 3 weeks in order to improve his frequency of falls  and prevent injury from falls  Baseline:04/13/22: Pt has had 1 fall in the last 2 weeks to his knowledge 05/25/22: 2 falls in last 2 weeks  Goal status: IN PROGRESS  ASSESSMENT:  CLINICAL IMPRESSION:  Pt presents with good motivation for completion of therapy activities. Pt progressing with ambulation, strength, and endurance but still has difficulties with sequencing of activities and balancing in different situations. Pt will continue to benefit from skilled physical therapy intervention to address impairments, improve QOL, and attain therapy goals.       OBJECTIVE IMPAIRMENTS Abnormal gait, decreased activity tolerance, decreased balance, decreased cognition, decreased coordination, decreased endurance, decreased mobility, difficulty walking, decreased ROM, decreased strength, decreased safety awareness, hypomobility, impaired perceived functional ability, and pain.   ACTIVITY LIMITATIONS carrying, lifting, bending, standing, squatting, stairs, transfers, bed mobility, bathing, toileting, dressing, self feeding, hygiene/grooming, and caring for others  PARTICIPATION LIMITATIONS: meal prep, cleaning, laundry, medication management, personal finances, driving, shopping, community activity, and yard work  PERSONAL FACTORS Time since onset of injury/illness/exacerbation are also affecting patient's functional outcome.   REHAB POTENTIAL: Fair    CLINICAL DECISION MAKING: Evolving/moderate complexity  EVALUATION COMPLEXITY: Moderate  PLAN: PT FREQUENCY: 2x/week  PT DURATION: 12 weeks  PLANNED INTERVENTIONS: Therapeutic exercises, Therapeutic activity, Neuromuscular re-education, Balance training, Gait training, Patient/Family education, Self Care, Joint mobilization, Joint manipulation, Stair training, Vestibular training, Canalith repositioning, DME instructions, Dry Needling, Electrical stimulation, Spinal manipulation, Spinal mobilization, Cryotherapy, Moist heat, Manual therapy, and  Re-evaluation  PLAN FOR NEXT SESSION: Core strength, balance training, ambulatory endurance  11:54 AM, 07/13/22 Particia Lather PT ,DPT Physical Therapist- [facility]

## 2022-07-18 ENCOUNTER — Ambulatory Visit: Payer: Medicare PPO

## 2022-07-18 ENCOUNTER — Ambulatory Visit: Payer: Medicare PPO | Attending: Family Medicine | Admitting: Physical Therapy

## 2022-07-18 DIAGNOSIS — R2681 Unsteadiness on feet: Secondary | ICD-10-CM | POA: Diagnosis not present

## 2022-07-18 DIAGNOSIS — R296 Repeated falls: Secondary | ICD-10-CM | POA: Insufficient documentation

## 2022-07-18 DIAGNOSIS — R262 Difficulty in walking, not elsewhere classified: Secondary | ICD-10-CM | POA: Insufficient documentation

## 2022-07-18 DIAGNOSIS — R269 Unspecified abnormalities of gait and mobility: Secondary | ICD-10-CM | POA: Diagnosis not present

## 2022-07-18 NOTE — Therapy (Signed)
OUTPATIENT PHYSICAL THERAPY NEURO Treatment / Physical Therapy Progress Note/ Discharge Therapy   Dates of reporting period  06/13/22   to   07/18/22      Patient Name: [name] MRN: [id number] DOB:November 29, 1950, 72 y.o., male Today's Date: 07/18/2022   PCP: Dr. Elsie Stain REFERRING PROVIDER: Dr. Sharolyn Douglas   PT End of Session - 07/18/22 1143     Visit Number 30    Number of Visits 44    Date for PT Re-Evaluation 08/15/22    Authorization Type [employer] Medicare Choice PPO    Progress Note Due on Visit 30    PT Start Time 1143    PT Stop Time 1225    PT Time Calculation (min) 42 min    Equipment Utilized During Treatment Gait belt    Activity Tolerance Patient tolerated treatment well;No increased pain    Behavior During Therapy WFL for tasks assessed/performed                                   Past Medical History:  Diagnosis Date   Arthritis    Bradycardia    Cancer ([place]) 2013   skin cancer   Depression    ptsd   Dysrhythmia    chronic slow heart rate   GERD (gastroesophageal reflux disease)    Headache(784.0)    tension headaches non recent   History of chicken pox    History of kidney stones    passed   Hypertension    treated with HCTZ   Pacemaker    Parkinson's disease    dx'ed 15 years ago   PTSD (post-traumatic stress disorder)    Shortness of breath dyspnea    Sleep apnea    doesn't use C-pap   Varicose veins    Past Surgical History:  Procedure Laterality Date   CHOLECYSTECTOMY N/A 10/22/2014   Procedure: LAPAROSCOPIC CHOLECYSTECTOMY WITH INTRAOPERATIVE CHOLANGIOGRAM;  Surgeon: Dia Crawford III, MD;  Location: [facility] ORS;  Service: General;  Laterality: N/A;   cyst removed      from lip as a child   INTRAMEDULLARY (IM) NAIL INTERTROCHANTERIC N/A 02/18/2019   Procedure: INTRAMEDULLARY (IM) NAIL INTERTROCHANTRIC, RIGHT,;  Surgeon: Thornton Park, MD;  Location: [facility] ORS;  Service: Orthopedics;  Laterality: N/A;   LUMBAR  LAMINECTOMY/DECOMPRESSION MICRODISCECTOMY Bilateral 12/14/2012   Procedure: Bilateral lumbar three-four, four-five decompressive laminotomy/foraminotomy;  Surgeon: Charlie Pitter, MD;  Location: [place] NEURO ORS;  Service: Neurosurgery;  Laterality: Bilateral;   PULSE GENERATOR IMPLANT Bilateral 12/13/2013   Procedure: Bilateral implantable pulse generator placement;  Surgeon: Erline Levine, MD;  Location: [place] [facility]O ORS;  Service: Neurosurgery;  Laterality: Bilateral;  Bilateral implantable pulse generator placement   skin cancer removed     from ears,   12 lft arm  rt leg 15   SUBTHALAMIC STIMULATOR BATTERY REPLACEMENT Bilateral 07/14/2017   Procedure: BILATERAL IMPLANTED PULSE GENERATOR CHANGE FOR DEEP BRAIN STIMULATOR;  Surgeon: Erline Levine, MD;  Location: [place];  Service: Neurosurgery;  Laterality: Bilateral;   SUBTHALAMIC STIMULATOR INSERTION Bilateral 12/06/2013   Procedure: SUBTHALAMIC STIMULATOR INSERTION;  Surgeon: Erline Levine, MD;  Location: [place] NEURO ORS;  Service: Neurosurgery;  Laterality: Bilateral;  Bilateral deep brain stimulator placement   Patient Active Problem List   Diagnosis Date Noted   Hematuria 10/10/2021   Sinus drainage 12/09/2020   Medicare annual wellness visit, subsequent 11/18/2020   Pacemaker    Dementia associated with  Parkinson's disease 06/17/2020   Vertigo 02/19/2020   Urine abnormality 02/19/2020   Dysphagia 02/23/2019   Fall at home 10/18/2017   REM behavioral disorder 11/02/2016   Radicular pain in right arm 10/09/2016   GERD (gastroesophageal reflux disease) 07/31/2016   Colon cancer screening 07/23/2015   Gout 02/18/2015   Depression 01/06/2015   Skin lesion 10/02/2014   Dupuytren's contracture 08/20/2014   Cough 07/21/2014   Lumbar stenosis with neurogenic claudication 06/13/2014   S/P deep brain stimulator placement 05/08/2014   Advance care planning 01/19/2014   Parkinson's disease 12/13/2013   PTSD (post-traumatic stress disorder) 06/13/2013    Erectile dysfunction 06/13/2013   HLD (hyperlipidemia) 06/13/2013   Essential hypertension 06/03/2013   Bradycardia by electrocardiogram 06/03/2013   Obstructive sleep apnea 03/12/2013    ONSET DATE: 15 years ago per chart  REFERRING DIAG: Parkinson's disease  THERAPY DIAG:  No diagnosis found.  Rationale for Evaluation and Treatment Rehabilitation  SUBJECTIVE:                                                                                                                                                                                              SUBJECTIVE STATEMENT:  Pt report no changes or falls since last session. Pt reports overall he is doing well. Pt reports wife is on board with plan to discharge this date.   Pt accompanied by: Caretaker ( hired)   PERTINENT HISTORY:   Date of most recent Admission: 09/25/2021 Date of Discharge: 09/28/2021  Discharge Diagnoses:  Altered mental status Parkinson disease (CMS/HCC) Hallucinations  Per [facility] discharge summary notes PHI : Hospital Course:   72 y.o. male with a history of Parkinson's Disease w/ DBS in place, PD-related dementia, OSA, depression, and seizure disorder who presented for EEG given concern for seizures.  Altered mental status Concern seizure activity Periods of unresponsiveness on the setting of new medication--Nuplazid (pimavanserin) recently started. No events of note on continuous EEG. This was held and he returned to his baseline mental status. Other workup including CT head, CTA h/n without obvious cause for periodic unresponsiveness. Nuplazid was discontinued.   Aspiration, weakness Of note, he had a coughing spell while eating though sounds like was precipitated by sneezing. Did well on RA, seen by SLP without any further needs or concerns.  Parkinson's Disease Parkinson's-related dementia - He follows in the Movement Disorders center at [facility]. Clinic notes suggest he seems to have worsening of his  overall disease trajectory. - Continued home Memantine and Sinemet   PMH: HTN, bradycardia s/p PPM, PD, DBS, COVID-19 infection, chronic low back pain s/p L4/5 surgery.  PAIN:  Are you having pain? Yes: NPRS scale: 6/10 Pain location: Low back Pain description: ache Aggravating factors: Prolonged standing/sitting, bending over Relieving factors: rest, changing positions   OBJECTIVE: (objective measures completed at initial evaluation unless otherwise dated)     INTERVENTION THIS DATE:   Physical therapy treatment session today consisted of completing assessment of goals and administration of testing as demonstrated and documented in flow sheet, treatment, and goals section of this note. Addition treatments may be found below.   Pt provided with handouts for standing and seated PWR! Activities with areas of focus highlighted.    PATIENT EDUCATION: Education details: PT plan of care; purpose of functional outcomes Person educated: Patient and Spouse Education method: Explanation and Verbal cues Education comprehension: verbalized understanding   HOME EXERCISE PROGRAM: Access Code: [email or web address] URL: [email or web address] Date: 04/20/2022 Prepared by: Rivka Barbara  Exercises - Sit to stand, walk with assistive device to another chair, sit and repeat.   - 1 x daily - 7 x weekly - 2 sets - 10 reps - Standing March with Counter Support  - 1 x daily - 7 x weekly - 3 sets - 10 reps - Standing in "bowling stance" with counter support   - 1 x daily - 7 x weekly - 3 sets - 10 reps - Heel Raises with Counter Support  - 1 x daily - 7 x weekly - 3 sets - 10 reps - Seated shoulder row while sitting on stability disc   - 1 x daily - 7 x weekly - 3 sets - 10 reps    GOALS: Goals reviewed with patient? Yes  SHORT TERM GOALS: Target date: 01/11/2022  Pt will be independent with HEP in order to improve strength and balance in order to decrease fall risk and improve  function at home and work.  Baseline: Patient reports not performing much in the way of any HEP except walking. 12/27: Having phases of participation and phases of refusal to participate.  Goal status: NOT MET  LONG TERM GOALS: Target date: 02/22/2022  Pt will decrease 5TSTS by at least 5 seconds in order to demonstrate clinically significant improvement in LE strength. Baseline: 02/28/2022: 19.5 sec with UE support  04/13/22:11.4 sec 05/25/22: 11.9 sec  Goal status: MET- 2.  Pt will improve FOTO to target score of 48 to display perceived improvements in ability to complete ADL's.  Baseline: 02/28/22=40 12/27: 45 4/1:52% Goal status: MET  3.  Pt will decrease TUG to < or = to 25 seconds/decrease in order to demonstrate decreased fall risk. Baseline: 02/28/22= 37.26 sec using upright 4WW 12/27:20.21 sec 05/25/22: 27.1 sec ( does not take brakes off without cues) 4/1:23.05 sec  Goal status: MET Keep assessing to ensure progress is maintained   4.  Pt will increase 10MWT by at least 0.13 m/s in order to demonstrate clinically significant improvement in community ambulation.   Baseline: 02/28/22= 0.67 m/s using upright 4WW 12/27: .89 m/s  Goal status: MET  5.  Patient will increase six minute walk test distance to >1000 for progression to community ambulator and improve gait ability  Baseline: 02/28/22: 720 feet with up walker 04/13/22: 815 feet with up walker 05/25/22: 877 feet with Up walker 4/1: 890 feet Goal status: NOT MET  6.  Patient will experience 1 fall or less per 2 week period for at least 3 weeks in order to improve his frequency of falls and prevent injury from falls  Baseline:04/13/22: Pt has had 1 fall in  the last 2 weeks to his knowledge 05/25/22: 2 falls in last 2 weeks 4/1: NOT MET: pt fell on back deck Goal status: NOT MET  ASSESSMENT:  CLINICAL IMPRESSION:  Pt presents for progress note this date. Pt makes progress toward several of his PT goals including TUG and FOTO. Pt  shows plateau with other goals and continues to have falls. Pt will be discharged with HEP at ths time and was instructed to continue with HEP diligently and use safe ambulation strategies to prevent falls. Pt to be discharged at this time.       OBJECTIVE IMPAIRMENTS Abnormal gait, decreased activity tolerance, decreased balance, decreased cognition, decreased coordination, decreased endurance, decreased mobility, difficulty walking, decreased ROM, decreased strength, decreased safety awareness, hypomobility, impaired perceived functional ability, and pain.   ACTIVITY LIMITATIONS carrying, lifting, bending, standing, squatting, stairs, transfers, bed mobility, bathing, toileting, dressing, self feeding, hygiene/grooming, and caring for others  PARTICIPATION LIMITATIONS: meal prep, cleaning, laundry, medication management, personal finances, driving, shopping, community activity, and yard work  PERSONAL FACTORS Time since onset of injury/illness/exacerbation are also affecting patient's functional outcome.   REHAB POTENTIAL: Fair    CLINICAL DECISION MAKING: Evolving/moderate complexity  EVALUATION COMPLEXITY: Moderate  PLAN: PT FREQUENCY: 2x/week  PT DURATION: 12 weeks  PLANNED INTERVENTIONS: Therapeutic exercises, Therapeutic activity, Neuromuscular re-education, Balance training, Gait training, Patient/Family education, Self Care, Joint mobilization, Joint manipulation, Stair training, Vestibular training, Canalith repositioning, DME instructions, Dry Needling, Electrical stimulation, Spinal manipulation, Spinal mobilization, Cryotherapy, Moist heat, Manual therapy, and Re-evaluation  PLAN FOR NEXT SESSION: 11:48 AM, 07/18/22 Particia Lather PT ,DPT Physical Therapist- [facility]

## 2022-07-20 ENCOUNTER — Ambulatory Visit: Payer: Medicare PPO

## 2022-07-20 ENCOUNTER — Telehealth: Payer: Self-pay

## 2022-07-20 ENCOUNTER — Ambulatory Visit: Payer: Medicare PPO | Admitting: Physical Therapy

## 2022-07-20 NOTE — Progress Notes (Signed)
Care Management & Coordination Services Pharmacy Team  Reason for Encounter: Appointment Reminder  Contacted patient to confirm telephone appointment with Charlene Brooke , PharmD on 07/22/22 at 11:00. Unsuccessful outreach. Left voicemail for patient to return call.  Have you seen any other providers since your last visit with PCP? Lorenda Peck, [facility] ,[facility],cardiology   Chart review:  Recent office visits:  05/05/22-Family Medicine LPN, Telemedicine AWV,discussed screenings,vaccines,no medication changes   Recent consult visits:  05/16/22-[facility] try an aripip prn for agitation/increasing AVH. Referral for PT,f/u 3 months 03/09/22-Timothy Gollan,MD(cardio)- HTN,no medicatio changes,f/u 1 year. 03/07/22-[facility]-dry eye,warm compress,OTC artificial tears,f/u 4 months 03/03/22-[facility]- no [facility] 02/28/22-[facility] f/u parkinsons- f/u 6 months 02/24/22-[facility]- f/u movement disorder,referral for PT,OT,no medication changes,f/u 6 months 02/02/22-Cameron Lambert,MD(cardio)- f/u tachycardia,f/u 1 year  Hospital visits:  None in previous 6 months   Star Rating Drugs:  Medication:  Last Fill: Day Supply No star medications identified  Care Gaps: Annual wellness visit in last year? Yes   Charlene Brooke, PharmD notified  Avel Sensor, [place] Assistant [phone]

## 2022-07-22 ENCOUNTER — Encounter: Payer: Medicare PPO | Admitting: Pharmacist

## 2022-07-22 NOTE — Progress Notes (Unsigned)
Care Management & Coordination Services Pharmacy Note  07/22/2022 Name:  [name] MRN:  [id number] DOB:  Jun 05, 1950  Summary: ***  Recommendations/Changes made from today's visit: ***  Follow up plan: -Health Concierge will call patient *** -Pharmacist follow up televisit scheduled for *** -PCP annual due ~10/09/22    Subjective: [name] is an 72 y.o. year old male who is a primary patient of Joaquim NamDuncan, Graham S, MD.  The care coordination team was consulted for assistance with disease management and care coordination needs.    Engaged with patient by telephone for follow up visit.  Recent office visits: 10/07/21 Dr Para Marchuncan OV: hospital f/u [facility] - AMS after Nuplazid, which has been d/c'd. Urine cx positive, Rx Keflex.  Recent consult visits: 05/16/22 [facility] provider: Parkinsons, dementia, AVH. Update Abilify 1 mg daily + 1 mg PRN.   03/09/22 Dr Mariah MillingGollan (Cardiology): Pacemaker; hx orthostasis, allow liberal BP. No changes.  02/28/22 [facility] provider: Parkinsons - progressing. S/p DBS. No changes.  02/24/22 PA Jacumin ([facility]): Parkinsons - referred to PT/OT. No med changes.  02/02/22 Dr Lalla BrothersLambert (Cardiology): pacemaker check  Hospital visits: 12/22/21 [facility] fall, syncope   Objective:  Lab Results  Component Value Date   CREATININE 0.80 12/22/2021   BUN 16 12/22/2021   GFR 78.59 11/06/2020   GFRNONAA >60 12/22/2021   GFRAA >60 10/02/2019   NA 140 12/22/2021   K 3.8 12/22/2021   CALCIUM 8.7 (L) 12/22/2021   CO2 24 12/22/2021   GLUCOSE 176 (H) 12/22/2021    Lab Results  Component Value Date/Time   GFR 78.59 11/06/2020 08:06 AM   GFR 69.20 10/04/2018 10:39 AM    Last diabetic Eye exam: No results found for: "HMDIABEYEEXA"  Last diabetic Foot exam: No results found for: "HMDIABFOOTEX"   Lab Results  Component Value Date   CHOL 182 11/06/2020   HDL 37.10 (L) 11/06/2020   LDLCALC 109 (H) 11/06/2020   TRIG 182.0 (H) 11/06/2020   CHOLHDL 5  11/06/2020       Latest Ref Rng & Units 11/06/2020    8:06 AM 10/04/2018   10:39 AM 09/28/2017    9:34 AM  Hepatic Function  Total Protein 6.0 - 8.3 g/dL 6.9  6.5  7.2   Albumin 3.5 - 5.2 g/dL 4.2  4.3  4.3   AST 0 - 37 U/L 12  11  11    ALT 0 - 53 U/L 9  6  3    Alk Phosphatase 39 - 117 U/L 96  80  87   Total Bilirubin 0.2 - 1.2 mg/dL 0.5  0.7  0.6   Bilirubin, Direct 0.0 - 0.3 mg/dL   0.1     Lab Results  Component Value Date/Time   TSH 2.08 11/06/2020 08:06 AM   TSH 0.56 07/21/2015 03:32 PM       Latest Ref Rng & Units 12/22/2021   12:45 PM 11/06/2020    8:06 AM 10/02/2019    2:15 PM  CBC  WBC 4.0 - 10.5 K/uL 7.4  6.2  7.3   Hemoglobin 13.0 - 17.0 g/dL 40.912.8  81.114.7  91.415.3   Hematocrit 39.0 - 52.0 % 37.0  43.7  45.1   Platelets 150 - 400 K/uL 204  190.0  197     No results found for: "VD25OH", "VITAMINB12"  Clinical ASCVD: {YES/NO:21197} The 10-year ASCVD risk score (Arnett DK, et al., 2019) is: 24.1%   Values used to calculate the score:  Age: 51 years     Sex: Male     Is Non-Hispanic African American: No     Diabetic: No     Tobacco smoker: No     Systolic Blood Pressure: 130 mmHg     Is BP treated: No     HDL Cholesterol: 37.1 mg/dL     Total Cholesterol: 182 mg/dL    ***Other: ([id number] if Afib, MMRC or CAT for COPD, ACT, DEXA)     05/05/2022   10:47 AM 10/02/2018    9:34 AM 10/03/2017    2:46 PM  Depression screen PHQ 2/9  Decreased Interest 1 0 0  Down, Depressed, Hopeless 1 0   PHQ - 2 Score 2 0 0  Altered sleeping 0 2 0  Tired, decreased energy 0 0   Change in appetite 1 2   Feeling bad or failure about yourself  0 2   Trouble concentrating 1 2   Moving slowly or fidgety/restless 1 0   Suicidal thoughts 0 0   PHQ-9 Score 5 8 0  Difficult doing work/chores  Not difficult at all      Social History   Tobacco Use  Smoking Status Never  Smokeless Tobacco Former   BP Readings from Last 3 Encounters:  03/09/22 130/80  02/02/22 114/62   12/22/21 132/72   Pulse Readings from Last 3 Encounters:  03/09/22 60  02/02/22 73  12/22/21 60   Wt Readings from Last 3 Encounters:  05/05/22 186 lb (84.4 kg)  03/09/22 186 lb 4 oz (84.5 kg)  12/22/21 167 lb (75.8 kg)   BMI Readings from Last 3 Encounters:  05/05/22 27.47 kg/m  03/09/22 27.50 kg/m  02/02/22 24.66 kg/m    Allergies  Allergen Reactions   Clonazepam Other (See Comments) and Photosensitivity    Worsening mood/depression "Over-sedation" per pt.   Morphine And Related Other (See Comments)    hallucinations   Nuplazid [Pimavanserin Tartrate] Other (See Comments)    Sedation   Pimavanserin Other (See Comments)    Sedation   Prednisone Other (See Comments)    Unclear reaction   Tramadol     Worsening balance    Medications Reviewed Today     Reviewed by Norman Herrlich, PT (Physical Therapist) on 06/29/22 at 1253  Med List Status: <None>   Medication Order Taking? Sig Documenting Provider Last Dose Status Informant  acetaminophen (TYLENOL) 325 MG tablet [id number] No Take 650 mg by mouth every 6 (six) hours as needed. [provider] Taking Active   ARIPiprazole (ABILIFY) 2 MG tablet [id number] No Take 2 mg by mouth daily. Taking 1/2 by mouth per [facility] [provider] Taking Active   aspirin EC 81 MG tablet [id number] No Take 1 tablet (81 mg total) by mouth daily. Swallow whole. Alver Sorrow, NP Taking Active   Carbidopa-Levodopa ER (RYTARY) 48.75-195 MG CPCR [id number] No Take 2 capsules by mouth 3 (three) times daily. Joaquim Nam, MD Taking Active   cholecalciferol (VITAMIN D) 1000 UNITS tablet [id number] No Take 1,000 Units by mouth daily. [provider] Taking Active Spouse/Significant Other  Cranberry 200 MG CAPS [id number] No Take by mouth. [provider] Taking Active   cycloSPORINE (RESTASIS) 0.05 % ophthalmic emulsion [id number] No SMARTSIG:In Eye(s) [provider] Taking Active    diclofenac Sodium (VOLTAREN) 1 % GEL [id number] No Apply topically 4 (four) times daily. [provider] Taking Active   escitalopram (LEXAPRO) 20 MG tablet [id number] No TAKE 1 TABLET  DAILY Joaquim Namuncan, Graham S, MD Taking Active   fluticasone [facility](FLONASE) 50 MCG/ACT nasal spray [id number] No Place 2 sprays into both nostrils daily. Joaquim Namuncan, Graham S, MD Taking Active   Melatonin 10 MG TABS [id number] No Take 15 mg by mouth at bedtime. [provider] Taking Active Spouse/Significant Other  memantine (NAMENDA) 10 MG tablet [id number] No TAKE 1 TABLET TWICE A DAY Joaquim Namuncan, Graham S, MD Taking Active   midodrine (PROAMATINE) 5 MG tablet [id number] No Take 1 tablet (5 mg total) by mouth as needed. For orthostasis, drops in pressure <100 when standing Antonieta IbaGollan, Timothy J, MD Taking Active   Nystatin POWD [id number] No Use twice a day if needed. Joaquim Namuncan, Graham S, MD Taking Active   omeprazole (PRILOSEC) 20 MG capsule [id number] No TAKE 1 CAPSULE DAILY Joaquim Namuncan, Graham S, MD Taking Active   rivastigmine (EXELON) 9.5 mg/24hr [id number] No 9.5 mg daily. [provider] Taking Active   Med List Note Rosann Auerbach(Korner, LamontHollice F, CPhT 12/06/12 1324): [facility]            SDOH:  (Social Determinants of Health) assessments and interventions performed: {yes/no:20286} SDOH Interventions    Flowsheet Row Clinical Support [facility]20[facility] Most recent reading at 05/05/2022 10:50 AM Chronic Care Management [facility] 07[facility]22[facility]2021 [facility] Most recent reading at 07/22/2021 10:33 AM Clinical Support from 10/02/2018 [facility] Most recent reading at 10/02/2018  9:34 AM Clinical Support from 07/25/2016 [facility] [place] Most recent reading at 07/25/2016  2:20 PM [facility] Visit [facility]2015 [facility] [place] Most recent reading at 07/21/2015  2:29 PM  Clinical Support [facility] 07[facility]21/2015 [facility] [place] Most recent reading at 07/21/2015  2:05 PM  SDOH Interventions        Food Insecurity Interventions Intervention Not Indicated Intervention Not Indicated -- -- -- --  Housing Interventions Intervention Not Indicated -- -- -- -- --  Transportation Interventions Intervention Not Indicated -- -- -- -- --  Utilities Interventions Intervention Not Indicated -- -- -- -- --  Depression Interventions/Treatment  Counseling, Medication -- Counseling --  [pt is being referred to PCP for further evaluation] Medication Medication  [currently on Wellbutrin XL]  Financial Strain Interventions Intervention Not Indicated Intervention Not Indicated -- -- -- --  Physical Activity Interventions Intervention Not Indicated -- -- -- -- --  Stress Interventions Intervention Not Indicated -- -- -- -- --  Social Connections Interventions Intervention Not Indicated -- -- -- -- --       Medication Assistance: {MEDASSISTANCEINFO:25044}  Medication Access: Within the past 30 days, how often has patient missed a dose of medication? *** Is a pillbox or other method used to improve adherence? {YES/NO:21197} Factors that may affect medication adherence? {CHL DESC; BARRIERS:21522} Are meds synced by current pharmacy? {YES/NO:21197} Are meds delivered by current pharmacy? {YES/NO:21197} Does patient experience delays in picking up medications due to transportation concerns? {YES/NO:21197}  [facility] Reviewed: Is patient disadvantaged to use [facility]?: {YES/NO:21197} Current Rx insurance plan: *** Name and location of Current pharmacy:  [facility] [address] - [place], [place] - [address] [place] [address] Phone: [phone] Fax: [phone]  [facility] [phone][id number]- [place], [place] - [address]  [place] [address] Phone: [phone] Fax: [phone]  [facility] [place],  [place] - [address] [place] [address] Phone: [phone] Fax: [phone]  [facility] DELIVERY - [place], [place] - [address] [place] [address] Phone: [phone] Fax: [phone]  [facility] [address] [facility] - [place], [place] - [address] [facility] [address] [place] [address] Phone: [phone] Fax: [phone]  [facility] services reviewed with patient today?: {YES/NO:21197} Patient requests to transfer care to [facility]?: {YES/NO:21197} Reason patient declined to change pharmacies: {US patient preference:27474}  Compliance/Adherence/Medication fill history: Care Gaps: Cologuard (due 12/2021)  Star-Rating Drugs: None   ASSESSMENT / PLAN  Orthostasis (SBP goal > 100) -Controlled - pt has not needed midodrine in a long time -Current treatment: Midodrine 5 mg PRN - Appropriate, Effective, Safe, Accessible -Medications previously tried: n/a -Denies hypotensive/hypertensive symptoms -Educated on Symptoms of hypotension and importance of maintaining adequate hydration; -Counseled to monitor BP at home periodically  Hyperlipidemia: (LDL goal < 100) -Not ideally controlled - LDL 109 (10/2020) slightly elevated -ASCVD 10-yr risk 20% (high); pt was on pravastatin years ago but it was stopped due leg weakness/gait instabilty, it was decided to forego statin therapy due to risks > benefits -Current treatment: Aspirin 81 mg daily - Appropriate, Effective, Safe, Accessible -Medications previously tried: pravastatin  -Educated on Cholesterol goals;  -Counseled on diet and exercise extensively  Parkinson's Disease (Goal: manage symptoms) -Controlled -[facility]. [facility] Armandina Stammer [facility] Parkinson's x 20 years -Current treatment  Rytary ER 23.75-95 mg 4 tab TID - Appropriate, Effective, Safe, Accessible Rivastigmine patch 9.5  mg/24hr - Appropriate, Effective, Safe, Accessible -Medications previously tried: n/a  -Recommended to continue current medication  Dementia (Goal: slow progression) -Controlled - per wife, pt's memory is not good and they are not sure how much memantine is helping -Current treatment  Memantine 10 mg BID -Appropriate, Effective, Safe, Accessible -Medications previously tried: n/a  -Reviewed indication for memantine and benefits for slowing cognitive decline, not improving memory -Recommended to continue current medication  Depression (Goal: manage symptoms) -Controlled -PHQ9: 8 (09/2018) - mild depression -Connected with PCP, LCSW Bambi Cottle for mental health support -Current treatment: Escitalopram 10 mg daily - Appropriate, Effective, Safe, Accessible Aripiprazole 2 mg - 1/2 tab daily + 1/2 tab PRN - Appropriate, Effective, Safe, Accessible -Medications previously tried/failed: n/a -Educated on Benefits of medication for symptom control -Recommended to continue current medication  Health Maintenance -Vaccine gaps: none -GERD: on omeprazole -OTC: Tylenol 325 mg, Vitamin D 1000 IU, Cranberry, melatonin   ***

## 2022-07-25 ENCOUNTER — Ambulatory Visit: Payer: Medicare PPO | Admitting: Physical Therapy

## 2022-07-25 ENCOUNTER — Ambulatory Visit: Payer: Medicare PPO

## 2022-07-26 ENCOUNTER — Ambulatory Visit (INDEPENDENT_AMBULATORY_CARE_PROVIDER_SITE_OTHER): Payer: Medicare PPO | Admitting: Psychology

## 2022-07-26 DIAGNOSIS — F4323 Adjustment disorder with mixed anxiety and depressed mood: Secondary | ICD-10-CM | POA: Diagnosis not present

## 2022-07-26 DIAGNOSIS — F331 Major depressive disorder, recurrent, moderate: Secondary | ICD-10-CM

## 2022-07-26 NOTE — Progress Notes (Signed)
[facility] Counselor/Therapist Progress Note  Patient ID: SWAYAM CORPORON, MRN: [id number],    Date: 07/26/2022  Time Spent: 45 minutes  Treatment Type: Indivdual  Reported Symptoms: sadness, and boredom  Mental Status Exam: Appearance:  Casual     Behavior: normal  Motor: Psychomotor Retardation  Speech/Language:  Slow  Affect: flat  Mood: normal  Thought process: concrete  Thought content:   WNL  Sensory/Perceptual disturbances:   WNL  Orientation: oriented to person, place, time/date, and situation  Attention: Fair  Concentration: Fair  Memory: Immediate;   Fair  Progress Energy of knowledge:  Fair  Insight:   Fair  Judgment:  Fair  Impulse Control: Fair   Risk Assessment: Danger to Self:  No Self-injurious Behavior: No Danger to Others: No Duty to Warn:no Physical Aggression / Violence:No  Access to Firearms a concern: No  Gang Involvement:No   Subjective: The patient attended a face-to-face individual therapy session  via video visit today.  The patient gave verbal consent for the video to be on WebEx.  The patient was in his home  and therapist was in the office.  The patient presents with a pleasant affect and mood is pleasant.  Today the patient read me some poetry and some of the things he had written previously and also showed me some pictures he had done on the Internet.  I provided him supportive therapy and some validation for the efforts that he had made and also for the  artistry that he exhibited.  Basically used ego supportive therapy and social skills.    Interventions: Ego-Supportive, CBT, Social skills training  Diagnosis:Major depressive disorder, recurrent episode, moderate  Adjustment disorder with mixed anxiety and depressed mood  Plan: Client Abilities/Strengths  Pt is bright and has a good sense of humor.  Client Treatment Preferences  Individual therapy.  Client Statement of Needs  improve coping skills regarding chronic illness.   Treatment Level  Outpatient Individual therapy  Symptoms  Depressed or irritable mood.: No Description Entered (Status: improved). Diminished interest in or  enjoyment of activities.: No Description Entered (Status: maintained). Feelings of hopelessness,  worthlessness, or inappropriate guilt.: No Description Entered (Status: improved). Lack of energy.: No  Description Entered (Status: maintained). Psychomotor agitation or retardation.: No Description  Entered (Status: maintained). Social withdrawal.: No Description Entered (Status: improved).  Problems Addressed  Unipolar Depression, Unipolar Depression, Unipolar Depression, Unipolar Depression  Goals 1. Alleviate depressive symptoms and return to previous level of effective  functioning. 2. Develop healthy interpersonal relationships that lead to the alleviation  and help prevent the relapse of depression. Objective Describe current and past experiences with depression including their impact on functioning and  attempts to resolve it. Target Date: 2023-05-01 Frequency: Monthly Progress: 60 Modality: individual Related Interventions 1. Encourage the client to share his/her thoughts and feelings of depression; express empathy and  build rapport while identifying primary cognitive, behavioral, interpersonal, or other  contributors to depression. Objective Learn and implement behavioral strategies to overcome depression. Target Date: 2023-05-01 Frequency: Monthly Progress: 80 Modality: individual Objective Identify and replace thoughts and beliefs that support depression. Target Date: 2023-05-01 Frequency: Monthly Progress: 70 Modality: individual Related Interventions 1. Conduct Cognitive-Behavioral Therapy (see Cognitive Behavior Therapy by Reola Calkins; Overcoming Depression by Agapito Games al.), beginning with helping the client learn the connection among  cognition, depressive feelings, and actions. 2. Explore and restructure  underlying assumptions and beliefs reflected in biased self-talk that  may put the client at risk for relapse or recurrence. 3.  Facilitate and reinforce the client's shift from biased depressive self-talk and beliefs to realitybased cognitive messages that enhance self-confidence and increase adaptive actions (see  "Positive Self-Talk" in the Adult Psychotherapy Homework Planner by Stephannie Li). Objective Learn and implement problem-solving and decision-making skills. Target Date: 2023-05-01 Frequency: Monthly Progress: 70 Modality: individual Related Interventions 1. Encourage in the client the development of a positive problem orientation in which problems  and solving them are viewed as a natural part of life and not something to be feared, despaired,  or avoided. 2. Conduct Problem-Solving Therapy (see Problem-Solving Therapy by Domenick Bookbinder and Rob Hickman) using techniques such as psychoeducation, modeling, and role-playing to teach client problem-solving skills (i.e., defining a problem specifically, generating possible solutions, evaluating the pros  and cons of each solution, selecting and implementing a plan of action, evaluating the efficacy  of the plan, accepting or revising the plan); role-play application of the problem-solving skill to  a real life issue (or assign "Applying Problem-Solving to Interpersonal Conflict" in the Adult  Psychotherapy Homework Planner by Stephannie Li). 3. Develop healthy thinking patterns and beliefs about self, others, and the world that lead to the alleviation and help prevent the relapse of  depression. 4. Recognize, accept, and cope with feelings of depression. Diagnosis Axis  none F43.21 (Adjustment Disorder, With depressed mood) - Open -  [Signifier: n/a]  Adjustment Disorder,  With Depressed Mood  Axis  none 296.32 (Major depressive affective disorder, recurrent episode,  moderate) - Open - [Signifier: n/a]  Conditions For Discharge Achievement of  treatment goals and objectives   Plan is to continue to offer supportive therapy to patient to help him deal with his depression related to his Parkinsons diagnosis.  Patient approved Treatment Plan   Jermeka Schlotterbeck Harrel Lemon, LCSW

## 2022-07-27 ENCOUNTER — Ambulatory Visit: Payer: Medicare PPO | Admitting: Physical Therapy

## 2022-07-27 ENCOUNTER — Ambulatory Visit: Payer: Medicare PPO

## 2022-07-29 DIAGNOSIS — S8011XA Contusion of right lower leg, initial encounter: Secondary | ICD-10-CM | POA: Diagnosis not present

## 2022-08-01 ENCOUNTER — Ambulatory Visit: Payer: Medicare PPO | Admitting: Physical Therapy

## 2022-08-01 ENCOUNTER — Ambulatory Visit: Payer: Medicare PPO

## 2022-08-03 ENCOUNTER — Ambulatory Visit: Payer: Medicare PPO | Admitting: Physical Therapy

## 2022-08-03 ENCOUNTER — Ambulatory Visit: Payer: Medicare PPO

## 2022-08-08 ENCOUNTER — Ambulatory Visit: Payer: Medicare PPO | Admitting: Physical Therapy

## 2022-08-08 ENCOUNTER — Ambulatory Visit: Payer: Medicare PPO

## 2022-08-10 ENCOUNTER — Ambulatory Visit: Payer: Medicare PPO

## 2022-08-10 ENCOUNTER — Ambulatory Visit: Payer: Medicare PPO | Admitting: Physical Therapy

## 2022-08-15 ENCOUNTER — Ambulatory Visit: Payer: Medicare PPO | Admitting: Physical Therapy

## 2022-08-15 ENCOUNTER — Ambulatory Visit: Payer: Medicare PPO

## 2022-08-16 ENCOUNTER — Ambulatory Visit (INDEPENDENT_AMBULATORY_CARE_PROVIDER_SITE_OTHER): Payer: Medicare PPO | Admitting: Psychology

## 2022-08-16 DIAGNOSIS — F4323 Adjustment disorder with mixed anxiety and depressed mood: Secondary | ICD-10-CM

## 2022-08-16 DIAGNOSIS — F331 Major depressive disorder, recurrent, moderate: Secondary | ICD-10-CM

## 2022-08-17 ENCOUNTER — Ambulatory Visit: Payer: Medicare PPO

## 2022-08-17 ENCOUNTER — Ambulatory Visit: Payer: Medicare PPO | Admitting: Physical Therapy

## 2022-08-17 NOTE — Progress Notes (Signed)
[facility] Counselor/Therapist Progress Note  Patient ID: [name], MRN: [id number],    Date: 08/16/2022  Time Spent: 45 minutes  Treatment Type: Indivdual  Reported Symptoms: sadness, and boredom  Mental Status Exam: Appearance:  Casual     Behavior: normal  Motor: Psychomotor Retardation  Speech/Language:  Slow  Affect: flat  Mood: normal  Thought process: concrete  Thought content:   WNL  Sensory/Perceptual disturbances:   WNL  Orientation: oriented to person, place, time/date, and situation  Attention: Fair  Concentration: Fair  Memory: Immediate;   Fair  Progress Energy of knowledge:  Fair  Insight:   Fair  Judgment:  Fair  Impulse Control: Fair   Risk Assessment: Danger to Self:  No Self-injurious Behavior: No Danger to Others: No Duty to Warn:no Physical Aggression / Violence:No  Access to Firearms a concern: No  Gang Involvement:No   Subjective: The patient attended a face-to-face individual therapy session  via video visit today.  The patient gave verbal consent for the video to be on WebEx.  The patient was in his home  and therapist was in the office.  The patient presents with a pleasant affect and mood is pleasant.  The patient reports that he feels like things are going okay.  He states that he does not feel too depressed.  We talked about going fishing 3 times since I saw him last.  He said he did not have very good time because he was working on getting his fishing line fixed.  The patient also talked about having a new grandbaby and he was a little confused because he said it was a girl and that was a boy.  The patient talked about regular things that they are doing and I provided supportive therapy. Interventions: Ego-Supportive, CBT, Social skills training  Diagnosis:Major depressive disorder, recurrent episode, moderate (HCC)  Adjustment disorder with mixed anxiety and depressed mood  Plan: Client Abilities/Strengths  Pt is bright and  has a good sense of humor.  Client Treatment Preferences  Individual therapy.  Client Statement of Needs  improve coping skills regarding chronic illness.  Treatment Level  Outpatient Individual therapy  Symptoms  Depressed or irritable mood.: No Description Entered (Status: improved). Diminished interest in or  enjoyment of activities.: No Description Entered (Status: maintained). Feelings of hopelessness,  worthlessness, or inappropriate guilt.: No Description Entered (Status: improved). Lack of energy.: No  Description Entered (Status: maintained). Psychomotor agitation or retardation.: No Description  Entered (Status: maintained). Social withdrawal.: No Description Entered (Status: improved).  Problems Addressed  Unipolar Depression, Unipolar Depression, Unipolar Depression, Unipolar Depression  Goals 1. Alleviate depressive symptoms and return to previous level of effective  functioning. 2. Develop healthy interpersonal relationships that lead to the alleviation  and help prevent the relapse of depression. Objective Describe current and past experiences with depression including their impact on functioning and  attempts to resolve it. Target Date: 2023-05-01 Frequency: Monthly Progress: 60 Modality: individual Related Interventions 1. Encourage the client to share his/her thoughts and feelings of depression; express empathy and  build rapport while identifying primary cognitive, behavioral, interpersonal, or other  contributors to depression. Objective Learn and implement behavioral strategies to overcome depression. Target Date: 2023-05-01 Frequency: Monthly Progress: 80 Modality: individual Objective Identify and replace thoughts and beliefs that support depression. Target Date: 2023-05-01 Frequency: Monthly Progress: 70 Modality: individual Related Interventions 1. Conduct Cognitive-Behavioral Therapy (see Cognitive Behavior Therapy by Reola Calkins; Overcoming Depression by  Agapito Games al.), beginning with helping  the client learn the connection among  cognition, depressive feelings, and actions. 2. Explore and restructure underlying assumptions and beliefs reflected in biased self-talk that  may put the client at risk for relapse or recurrence. 3. Facilitate and reinforce the client's shift from biased depressive self-talk and beliefs to realitybased cognitive messages that enhance self-confidence and increase adaptive actions (see  "Positive Self-Talk" in the Adult Psychotherapy Homework Planner by Stephannie Li). Objective Learn and implement problem-solving and decision-making skills. Target Date: 2023-05-01 Frequency: Monthly Progress: 70 Modality: individual Related Interventions 1. Encourage in the client the development of a positive problem orientation in which problems  and solving them are viewed as a natural part of life and not something to be feared, despaired,  or avoided. 2. Conduct Problem-Solving Therapy (see Problem-Solving Therapy by Domenick Bookbinder and Rob Hickman) using techniques such as psychoeducation, modeling, and role-playing to teach client problem-solving skills (i.e., defining a problem specifically, generating possible solutions, evaluating the pros  and cons of each solution, selecting and implementing a plan of action, evaluating the efficacy  of the plan, accepting or revising the plan); role-play application of the problem-solving skill to  a real life issue (or assign "Applying Problem-Solving to Interpersonal Conflict" in the Adult  Psychotherapy Homework Planner by Stephannie Li). 3. Develop healthy thinking patterns and beliefs about self, others, and the world that lead to the alleviation and help prevent the relapse of  depression. 4. Recognize, accept, and cope with feelings of depression. Diagnosis Axis  none F43.21 (Adjustment Disorder, With depressed mood) - Open -  [Signifier: n/a]  Adjustment Disorder,  With Depressed Mood  Axis   none 296.32 (Major depressive affective disorder, recurrent episode,  moderate) - Open - [Signifier: n/a]  Conditions For Discharge Achievement of treatment goals and objectives   Plan is to continue to offer supportive therapy to patient to help him deal with his depression related to his Parkinsons diagnosis.  Patient approved Treatment Plan   Rahel Carlton Harrel Lemon, LCSW

## 2022-08-22 ENCOUNTER — Ambulatory Visit (INDEPENDENT_AMBULATORY_CARE_PROVIDER_SITE_OTHER): Payer: Medicare PPO

## 2022-08-22 ENCOUNTER — Ambulatory Visit: Payer: Medicare PPO | Admitting: Physical Therapy

## 2022-08-22 ENCOUNTER — Ambulatory Visit: Payer: Medicare PPO

## 2022-08-22 DIAGNOSIS — I495 Sick sinus syndrome: Secondary | ICD-10-CM

## 2022-08-22 LAB — CUP PACEART REMOTE DEVICE CHECK
Battery Remaining Longevity: 141 mo
Battery Voltage: 3.03 V
Brady Statistic AP VP Percent: 0.04 %
Brady Statistic AP VS Percent: 92.67 %
Brady Statistic AS VP Percent: 0 %
Brady Statistic AS VS Percent: 7.29 %
Brady Statistic RA Percent Paced: 92.68 %
Brady Statistic RV Percent Paced: 0.04 %
Date Time Interrogation Session: 20240506013244
Implantable Lead Connection Status: 753985
Implantable Lead Connection Status: 753985
Implantable Lead Implant Date: 20220506
Implantable Lead Implant Date: 20220506
Implantable Lead Location: 753859
Implantable Lead Location: 753860
Implantable Lead Model: 5076
Implantable Lead Model: 5076
Implantable Pulse Generator Implant Date: 20220506
Lead Channel Impedance Value: 342 Ohm
Lead Channel Impedance Value: 380 Ohm
Lead Channel Impedance Value: 494 Ohm
Lead Channel Impedance Value: 551 Ohm
Lead Channel Pacing Threshold Amplitude: 0.75 V
Lead Channel Pacing Threshold Amplitude: 0.875 V
Lead Channel Pacing Threshold Pulse Width: 0.4 ms
Lead Channel Pacing Threshold Pulse Width: 0.4 ms
Lead Channel Sensing Intrinsic Amplitude: 2 mV
Lead Channel Sensing Intrinsic Amplitude: 2 mV
Lead Channel Sensing Intrinsic Amplitude: 9 mV
Lead Channel Sensing Intrinsic Amplitude: 9 mV
Lead Channel Setting Pacing Amplitude: 1.75 V
Lead Channel Setting Pacing Amplitude: 2 V
Lead Channel Setting Pacing Pulse Width: 0.4 ms
Lead Channel Setting Sensing Sensitivity: 1.2 mV
Zone Setting Status: 755011
Zone Setting Status: 755011

## 2022-08-24 ENCOUNTER — Ambulatory Visit: Payer: Medicare PPO | Admitting: Physical Therapy

## 2022-08-24 ENCOUNTER — Ambulatory Visit: Payer: Medicare PPO

## 2022-08-25 DIAGNOSIS — G20A2 Parkinson's disease without dyskinesia, with fluctuations: Secondary | ICD-10-CM | POA: Diagnosis not present

## 2022-08-25 DIAGNOSIS — F0390 Unspecified dementia without behavioral disturbance: Secondary | ICD-10-CM | POA: Diagnosis not present

## 2022-08-25 DIAGNOSIS — F0280 Dementia in other diseases classified elsewhere without behavioral disturbance: Secondary | ICD-10-CM | POA: Diagnosis not present

## 2022-08-25 DIAGNOSIS — G20A1 Parkinson's disease without dyskinesia, without mention of fluctuations: Secondary | ICD-10-CM | POA: Diagnosis not present

## 2022-08-25 DIAGNOSIS — Z9689 Presence of other specified functional implants: Secondary | ICD-10-CM | POA: Diagnosis not present

## 2022-08-29 ENCOUNTER — Ambulatory Visit: Payer: Medicare PPO

## 2022-08-29 ENCOUNTER — Ambulatory Visit: Payer: Medicare PPO | Admitting: Physical Therapy

## 2022-08-31 ENCOUNTER — Ambulatory Visit: Payer: Medicare PPO

## 2022-08-31 ENCOUNTER — Ambulatory Visit: Payer: Medicare PPO | Admitting: Physical Therapy

## 2022-09-05 ENCOUNTER — Ambulatory Visit: Payer: Medicare PPO | Admitting: Physical Therapy

## 2022-09-05 ENCOUNTER — Ambulatory Visit: Payer: Medicare PPO

## 2022-09-06 ENCOUNTER — Ambulatory Visit (INDEPENDENT_AMBULATORY_CARE_PROVIDER_SITE_OTHER): Payer: Medicare PPO | Admitting: Psychology

## 2022-09-06 DIAGNOSIS — F4323 Adjustment disorder with mixed anxiety and depressed mood: Secondary | ICD-10-CM

## 2022-09-06 DIAGNOSIS — F331 Major depressive disorder, recurrent, moderate: Secondary | ICD-10-CM

## 2022-09-06 NOTE — Progress Notes (Signed)
                Jacy Howat G Daneil Beem, LCSW

## 2022-09-07 ENCOUNTER — Ambulatory Visit: Payer: Medicare PPO

## 2022-09-07 ENCOUNTER — Ambulatory Visit: Payer: Medicare PPO | Admitting: Physical Therapy

## 2022-09-14 ENCOUNTER — Ambulatory Visit: Payer: Medicare PPO

## 2022-09-14 ENCOUNTER — Ambulatory Visit: Payer: Medicare PPO | Admitting: Physical Therapy

## 2022-09-19 ENCOUNTER — Ambulatory Visit: Payer: Medicare PPO | Admitting: Physical Therapy

## 2022-09-19 ENCOUNTER — Ambulatory Visit: Payer: Medicare PPO

## 2022-09-21 ENCOUNTER — Ambulatory Visit: Payer: Medicare PPO | Admitting: Physical Therapy

## 2022-09-21 ENCOUNTER — Ambulatory Visit: Payer: Medicare PPO

## 2022-09-21 NOTE — Progress Notes (Signed)
Remote pacemaker transmission.   

## 2022-09-26 ENCOUNTER — Ambulatory Visit: Payer: Medicare PPO

## 2022-09-26 ENCOUNTER — Ambulatory Visit: Payer: Medicare PPO | Admitting: Physical Therapy

## 2022-09-27 ENCOUNTER — Ambulatory Visit (INDEPENDENT_AMBULATORY_CARE_PROVIDER_SITE_OTHER): Payer: Medicare PPO | Admitting: Psychology

## 2022-09-27 DIAGNOSIS — F4323 Adjustment disorder with mixed anxiety and depressed mood: Secondary | ICD-10-CM | POA: Diagnosis not present

## 2022-09-27 DIAGNOSIS — F331 Major depressive disorder, recurrent, moderate: Secondary | ICD-10-CM

## 2022-09-27 NOTE — Progress Notes (Signed)
                [name] G George Koebel, LCSW 

## 2022-09-28 ENCOUNTER — Ambulatory Visit: Payer: Medicare PPO

## 2022-09-28 ENCOUNTER — Ambulatory Visit: Payer: Medicare PPO | Admitting: Physical Therapy

## 2022-10-03 ENCOUNTER — Ambulatory Visit: Payer: Medicare PPO | Admitting: Physical Therapy

## 2022-10-03 ENCOUNTER — Ambulatory Visit: Payer: Medicare PPO

## 2022-10-05 ENCOUNTER — Ambulatory Visit: Payer: Medicare PPO

## 2022-10-05 ENCOUNTER — Ambulatory Visit: Payer: Medicare PPO | Admitting: Physical Therapy

## 2022-10-10 ENCOUNTER — Ambulatory Visit: Payer: Medicare PPO | Admitting: Physical Therapy

## 2022-10-17 ENCOUNTER — Ambulatory Visit: Payer: Medicare PPO | Admitting: Physical Therapy

## 2022-10-18 ENCOUNTER — Ambulatory Visit (INDEPENDENT_AMBULATORY_CARE_PROVIDER_SITE_OTHER): Payer: Medicare PPO | Admitting: Psychology

## 2022-10-18 DIAGNOSIS — F4323 Adjustment disorder with mixed anxiety and depressed mood: Secondary | ICD-10-CM | POA: Diagnosis not present

## 2022-10-18 DIAGNOSIS — F331 Major depressive disorder, recurrent, moderate: Secondary | ICD-10-CM

## 2022-10-18 NOTE — Progress Notes (Signed)
                Hassie Mandt G Yordi Krager, LCSW 

## 2022-10-19 ENCOUNTER — Ambulatory Visit: Payer: Medicare PPO | Admitting: Physical Therapy

## 2022-10-24 ENCOUNTER — Ambulatory Visit: Payer: Medicare PPO | Admitting: Physical Therapy

## 2022-10-26 ENCOUNTER — Ambulatory Visit: Payer: Medicare PPO | Admitting: Physical Therapy

## 2022-10-31 ENCOUNTER — Ambulatory Visit: Payer: Medicare PPO | Admitting: Physical Therapy

## 2022-11-02 ENCOUNTER — Ambulatory Visit: Payer: Medicare PPO | Admitting: Physical Therapy

## 2022-11-07 ENCOUNTER — Ambulatory Visit: Payer: Medicare PPO | Admitting: Physical Therapy

## 2022-11-08 ENCOUNTER — Ambulatory Visit (INDEPENDENT_AMBULATORY_CARE_PROVIDER_SITE_OTHER): Payer: Medicare PPO | Admitting: Psychology

## 2022-11-08 DIAGNOSIS — F4323 Adjustment disorder with mixed anxiety and depressed mood: Secondary | ICD-10-CM | POA: Diagnosis not present

## 2022-11-08 DIAGNOSIS — F331 Major depressive disorder, recurrent, moderate: Secondary | ICD-10-CM

## 2022-11-08 NOTE — Progress Notes (Signed)
[facility] Counselor/Therapist Progress Note  Patient ID: [name], MRN: [id number],    Date: 11/08/2022  Time Spent: 45 minutes  Time in:  4:00  Time out:  4:49  Treatment Type: Indivdual  Reported Symptoms: sadness, and boredom  Mental Status Exam: Appearance:  Casual     Behavior: normal  Motor: Psychomotor Retardation  Speech/Language:  Slow  Affect: flat  Mood: normal  Thought process: concrete  Thought content:   WNL  Sensory/Perceptual disturbances:   WNL  Orientation: oriented to person, place, time/date, and situation  Attention: Fair  Concentration: Fair  Memory: Immediate;   Fair  Progress [employer] of knowledge:  Fair  Insight:   Fair  Judgment:  Fair  Impulse Control: Fair   Risk Assessment: Danger to Self:  No Self-injurious Behavior: No Danger to Others: No Duty to Warn:no Physical Aggression / Violence:No  Access to Firearms a concern: No  Gang Involvement:No   Subjective: The patient attended a face-to-face individual therapy session  via video visit today.  The patient gave verbal consent for the video to be on Caregility and his wife is aware of limitations of telehealth.  The patient was in his home  and therapist was in the office.  The patient presents as somewhat confused today.  The patient's wife got home the session and reports that she fell recently and broke her foot and they have had to have caregivers and until about 10:00 at night.  He mentions something about going down to [facility] for 3 or 4 days.  I am wondering if he was admitted to the [facility] for some stabilization.  The patient's wife reports that he is sundowning in the evenings.  I am concerned about therapy because he struggles with trying to have conversations.  I understand he is having issues with depression and with thoughts at times.  I have had a conversation with his wife and his wife would like to continue to have therapy sessions even if he is struggling with his thoughts.  I  will continue to provide supportive therapy and we will have another conversation with her if he continues to struggle with having discussions. Interventions: Ego-Supportive, CBT, Social skills training, problem solving  Diagnosis:Major depressive disorder, recurrent episode, moderate (HCC)  Adjustment disorder with mixed anxiety and depressed mood  Plan: Client Abilities/Strengths  Pt is bright and has a good sense of humor.  Client Treatment Preferences  Individual therapy.  Client Statement of Needs  improve coping skills regarding chronic illness.  Treatment Level  Outpatient Individual therapy  Symptoms  Depressed or irritable mood.: No Description Entered (Status: improved). Diminished interest in or  enjoyment of activities.: No Description Entered (Status: maintained). Feelings of hopelessness,  worthlessness, or inappropriate guilt.: No Description Entered (Status: improved). Lack of energy.: No  Description Entered (Status: maintained). Psychomotor agitation or retardation.: No Description  Entered (Status: maintained). Social withdrawal.: No Description Entered (Status: improved).  Problems Addressed  Unipolar Depression, Unipolar Depression, Unipolar Depression, Unipolar Depression  Goals 1. Alleviate depressive symptoms and return to previous level of effective  functioning. 2. Develop healthy interpersonal relationships that lead to the alleviation  and help prevent the relapse of depression. Objective Describe current and past experiences with depression including their impact on functioning and  attempts to resolve it. Target Date: 2023-05-01 Frequency: Monthly Progress: 60 Modality: individual Related Interventions 1. Encourage the client to share his/her thoughts and feelings of depression; express empathy and  build rapport while identifying primary cognitive, behavioral,  interpersonal, or other  contributors to depression. Objective Learn and implement  behavioral strategies to overcome depression. Target Date: 2023-05-01 Frequency: Monthly Progress: 80 Modality: individual Objective Identify and replace thoughts and beliefs that support depression. Target Date: 2023-05-01 Frequency: Monthly Progress: 70 Modality: individual Related Interventions 1. Conduct Cognitive-Behavioral Therapy (see Cognitive Behavior Therapy by Reola Calkins; Overcoming Depression by Agapito Games al.), beginning with helping the client learn the connection among  cognition, depressive feelings, and actions. 2. Explore and restructure underlying assumptions and beliefs reflected in biased self-talk that  may put the client at risk for relapse or recurrence. 3. Facilitate and reinforce the client's shift from biased depressive self-talk and beliefs to realitybased cognitive messages that enhance self-confidence and increase adaptive actions (see  "Positive Self-Talk" in the Adult Psychotherapy Homework Planner by Stephannie Li). Objective Learn and implement problem-solving and decision-making skills. Target Date: 2023-05-01 Frequency: Monthly Progress: 70 Modality: individual Related Interventions 1. Encourage in the client the development of a positive problem orientation in which problems  and solving them are viewed as a natural part of life and not something to be feared, despaired,  or avoided. 2. Conduct Problem-Solving Therapy (see Problem-Solving Therapy by Domenick Bookbinder and Rob Hickman) using techniques such as psychoeducation, modeling, and role-playing to teach client problem-solving skills (i.e., defining a problem specifically, generating possible solutions, evaluating the pros  and cons of each solution, selecting and implementing a plan of action, evaluating the efficacy  of the plan, accepting or revising the plan); role-play application of the problem-solving skill to  a real life issue (or assign "Applying Problem-Solving to Interpersonal Conflict" in the Adult   Psychotherapy Homework Planner by Stephannie Li). 3. Develop healthy thinking patterns and beliefs about self, others, and the world that lead to the alleviation and help prevent the relapse of  depression. 4. Recognize, accept, and cope with feelings of depression. Diagnosis Axis  none F43.21 (Adjustment Disorder, With depressed mood) - Open -  [Signifier: n/a]  Adjustment Disorder,  With Depressed Mood  Axis  none 296.32 (Major depressive affective disorder, recurrent episode,  moderate) - Open - [Signifier: n/a]  Conditions For Discharge Achievement of treatment goals and objectives   Plan is to continue to offer supportive therapy to patient to help him deal with his depression related to his Parkinsons diagnosis.  Patient approved Treatment Plan   Doreatha Offer Harrel Lemon, LCSW

## 2022-11-09 ENCOUNTER — Ambulatory Visit: Payer: Medicare PPO | Admitting: Physical Therapy

## 2022-11-11 DIAGNOSIS — S61212A Laceration without foreign body of right middle finger without damage to nail, initial encounter: Secondary | ICD-10-CM | POA: Diagnosis not present

## 2022-11-14 ENCOUNTER — Ambulatory Visit: Payer: Medicare PPO | Admitting: Physical Therapy

## 2022-11-16 ENCOUNTER — Ambulatory Visit: Payer: Medicare PPO | Admitting: Physical Therapy

## 2022-11-21 ENCOUNTER — Ambulatory Visit (INDEPENDENT_AMBULATORY_CARE_PROVIDER_SITE_OTHER): Payer: Medicare PPO

## 2022-11-21 ENCOUNTER — Ambulatory Visit: Payer: Medicare PPO | Admitting: Physical Therapy

## 2022-11-21 DIAGNOSIS — I495 Sick sinus syndrome: Secondary | ICD-10-CM

## 2022-11-23 ENCOUNTER — Ambulatory Visit: Payer: Medicare PPO | Admitting: Physical Therapy

## 2022-11-28 ENCOUNTER — Ambulatory Visit: Payer: Medicare PPO | Admitting: Physical Therapy

## 2022-11-29 ENCOUNTER — Ambulatory Visit (INDEPENDENT_AMBULATORY_CARE_PROVIDER_SITE_OTHER): Payer: Medicare PPO | Admitting: Psychology

## 2022-11-29 DIAGNOSIS — F331 Major depressive disorder, recurrent, moderate: Secondary | ICD-10-CM

## 2022-11-29 DIAGNOSIS — F4323 Adjustment disorder with mixed anxiety and depressed mood: Secondary | ICD-10-CM

## 2022-11-29 NOTE — Progress Notes (Unsigned)
                Ramonita Koenig G Ardene Remley, LCSW

## 2022-11-30 ENCOUNTER — Ambulatory Visit: Payer: Medicare PPO | Admitting: Physical Therapy

## 2022-12-05 ENCOUNTER — Ambulatory Visit: Payer: Medicare PPO | Admitting: Physical Therapy

## 2022-12-06 NOTE — Progress Notes (Signed)
Remote pacemaker transmission.   

## 2022-12-07 ENCOUNTER — Ambulatory Visit: Payer: Medicare PPO | Admitting: Physical Therapy

## 2022-12-12 ENCOUNTER — Ambulatory Visit: Payer: Medicare PPO | Admitting: Physical Therapy

## 2022-12-14 ENCOUNTER — Ambulatory Visit: Payer: Medicare PPO | Admitting: Physical Therapy

## 2022-12-20 ENCOUNTER — Ambulatory Visit (INDEPENDENT_AMBULATORY_CARE_PROVIDER_SITE_OTHER): Payer: Medicare PPO | Admitting: Psychology

## 2022-12-20 DIAGNOSIS — F4323 Adjustment disorder with mixed anxiety and depressed mood: Secondary | ICD-10-CM | POA: Diagnosis not present

## 2022-12-20 DIAGNOSIS — F331 Major depressive disorder, recurrent, moderate: Secondary | ICD-10-CM

## 2022-12-20 NOTE — Progress Notes (Signed)
[facility] Counselor/Therapist Progress Note  Patient ID: [name], MRN: [id number],    Date: 12/20/2022  Time Spent: 45 minutes  Time in:  4:00  Time out:  4:46  Treatment Type: Indivdual  Reported Symptoms: sadness, and boredom  Mental Status Exam: Appearance:  Casual     Behavior: normal  Motor: Psychomotor Retardation  Speech/Language:  Slow  Affect: flat  Mood: pleasant  Thought process: concrete  Thought content:   WNL  Sensory/Perceptual disturbances:   WNL  Orientation: oriented to person, place, time/date, and situation  Attention: Fair  Concentration: Fair  Memory: Immediate;   Fair  Progress Energy of knowledge:  Fair  Insight:   Fair  Judgment:  Fair  Impulse Control: Fair   Risk Assessment: Danger to Self:  No Self-injurious Behavior: No Danger to Others: No Duty to Warn:no Physical Aggression / Violence:No  Access to Firearms a concern: No  Gang Involvement:No   Subjective: The patient attended a face-to-face individual therapy session  via video visit today.  The patient gave verbal consent for the video to be on Caregility and his wife is aware of limitations of telehealth.  The patient was in his home  and therapist was in the office.  The patient presents as a little confused today.  He seems a little better than he was the last time I saw him.  The patient still has frustrations because he cannot do the things that he used to be able to do.  He gets frustrated because his wife tells him that he cannot do things.  He brought up that he thought we were supposed to have some sort of meeting all together and I told him that I would be happy to do that if he felt like that would be something that would be helpful to him however he could not verbalize how it would be helpful.  The patient continues to have caregivers in the home and I encouraged him to ask them to take him out or do things with him outside of the house possibly on the deck so that he  would not be quite as bored.   Interventions: Ego-Supportive, CBT, Social skills training, problem solving  Diagnosis:Major depressive disorder, recurrent episode, moderate (HCC)  Adjustment disorder with mixed anxiety and depressed mood  Plan: Client Abilities/Strengths  Pt is bright and has a good sense of humor.  Client Treatment Preferences  Individual therapy.  Client Statement of Needs  improve coping skills regarding chronic illness.  Treatment Level  Outpatient Individual therapy  Symptoms  Depressed or irritable mood.: No Description Entered (Status: improved). Diminished interest in or  enjoyment of activities.: No Description Entered (Status: maintained). Feelings of hopelessness,  worthlessness, or inappropriate guilt.: No Description Entered (Status: improved). Lack of energy.: No  Description Entered (Status: maintained). Psychomotor agitation or retardation.: No Description  Entered (Status: maintained). Social withdrawal.: No Description Entered (Status: improved).  Problems Addressed  Unipolar Depression, Unipolar Depression, Unipolar Depression, Unipolar Depression  Goals 1. Alleviate depressive symptoms and return to previous level of effective  functioning. 2. Develop healthy interpersonal relationships that lead to the alleviation  and help prevent the relapse of depression. Objective Describe current and past experiences with depression including their impact on functioning and  attempts to resolve it. Target Date: 2023-05-01 Frequency: Monthly Progress: 60 Modality: individual Related Interventions 1. Encourage the client to share his/her thoughts and feelings of depression; express empathy and  build rapport while identifying primary cognitive, behavioral,  interpersonal, or other  contributors to depression. Objective Learn and implement behavioral strategies to overcome depression. Target Date: 2023-05-01 Frequency: Monthly Progress: 80 Modality:  individual Objective Identify and replace thoughts and beliefs that support depression. Target Date: 2023-05-01 Frequency: Monthly Progress: 70 Modality: individual Related Interventions 1. Conduct Cognitive-Behavioral Therapy (see Cognitive Behavior Therapy by Reola Calkins; Overcoming Depression by Agapito Games al.), beginning with helping the client learn the connection among  cognition, depressive feelings, and actions. 2. Explore and restructure underlying assumptions and beliefs reflected in biased self-talk that  may put the client at risk for relapse or recurrence. 3. Facilitate and reinforce the client's shift from biased depressive self-talk and beliefs to realitybased cognitive messages that enhance self-confidence and increase adaptive actions (see  "Positive Self-Talk" in the Adult Psychotherapy Homework Planner by Stephannie Li). Objective Learn and implement problem-solving and decision-making skills. Target Date: 2023-05-01 Frequency: Monthly Progress: 70 Modality: individual Related Interventions 1. Encourage in the client the development of a positive problem orientation in which problems  and solving them are viewed as a natural part of life and not something to be feared, despaired,  or avoided. 2. Conduct Problem-Solving Therapy (see Problem-Solving Therapy by Domenick Bookbinder and Rob Hickman) using techniques such as psychoeducation, modeling, and role-playing to teach client problem-solving skills (i.e., defining a problem specifically, generating possible solutions, evaluating the pros  and cons of each solution, selecting and implementing a plan of action, evaluating the efficacy  of the plan, accepting or revising the plan); role-play application of the problem-solving skill to  a real life issue (or assign "Applying Problem-Solving to Interpersonal Conflict" in the Adult  Psychotherapy Homework Planner by Stephannie Li). 3. Develop healthy thinking patterns and beliefs about self, others, and  the world that lead to the alleviation and help prevent the relapse of  depression. 4. Recognize, accept, and cope with feelings of depression. Diagnosis Axis  none F43.21 (Adjustment Disorder, With depressed mood) - Open -  [Signifier: n/a]  Adjustment Disorder,  With Depressed Mood  Axis  none 296.32 (Major depressive affective disorder, recurrent episode,  moderate) - Open - [Signifier: n/a]  Conditions For Discharge Achievement of treatment goals and objectives   Plan is to continue to offer supportive therapy to patient to help him deal with his depression related to his Parkinsons diagnosis.  Patient approved Treatment Plan   Timmy Bubeck Harrel Lemon, LCSW

## 2022-12-21 ENCOUNTER — Ambulatory Visit: Payer: Medicare PPO | Admitting: Physical Therapy

## 2022-12-26 ENCOUNTER — Ambulatory Visit: Payer: Medicare PPO | Admitting: Physical Therapy

## 2022-12-28 ENCOUNTER — Ambulatory Visit: Payer: Medicare PPO | Admitting: Physical Therapy

## 2023-01-02 ENCOUNTER — Ambulatory Visit: Payer: Medicare PPO | Admitting: Physical Therapy

## 2023-01-04 ENCOUNTER — Ambulatory Visit: Payer: Medicare PPO | Admitting: Physical Therapy

## 2023-01-09 ENCOUNTER — Ambulatory Visit: Payer: Medicare PPO | Admitting: Physical Therapy

## 2023-01-10 ENCOUNTER — Ambulatory Visit: Payer: Medicare PPO | Admitting: Dermatology

## 2023-01-10 ENCOUNTER — Ambulatory Visit: Payer: Medicare PPO | Admitting: Psychology

## 2023-01-10 DIAGNOSIS — F331 Major depressive disorder, recurrent, moderate: Secondary | ICD-10-CM | POA: Diagnosis not present

## 2023-01-10 DIAGNOSIS — F4323 Adjustment disorder with mixed anxiety and depressed mood: Secondary | ICD-10-CM | POA: Diagnosis not present

## 2023-01-10 NOTE — Progress Notes (Addendum)
[facility] Counselor/Therapist Progress Note  Patient ID: [name], MRN: [id number],    Date: 01/10/2023  Time Spent: 45 minutes  Time in:  4:00  Time out:  4:45  Treatment Type: Indivdual  Reported Symptoms: sadness, and boredom  Mental Status Exam: Appearance:  Casual     Behavior: normal  Motor: Psychomotor Retardation  Speech/Language:  Slow  Affect: flat  Mood: pleasant  Thought process: concrete  Thought content:   WNL  Sensory/Perceptual disturbances:   WNL  Orientation: oriented to person, place, time/date, and situation  Attention: Fair  Concentration: Fair  Memory: Immediate;   Fair  Progress Energy of knowledge:  Fair  Insight:   Fair  Judgment:  Fair  Impulse Control: Fair   Risk Assessment: Danger to Self:  No Self-injurious Behavior: No Danger to Others: No Duty to Warn:no Physical Aggression / Violence:No  Access to Firearms a concern: No  Gang Involvement:No   Subjective: The patient attended a face-to-face individual therapy session  via video visit today.  The patient gave verbal consent for the video to be on Caregility and his wife is aware of limitations of telehealth.  The patient was in his home  and therapist was in the office.  The patient seems more alert today.  He states that they have not been doing much other than the usual.  He reports that he is going to a men's retreat in October and seems to be looking forward to that.  He states that he feels relatively stable and does not seem to be as frustrated as he has at times in the past.  We talked about things that are going on that are good for him and I provided supportive therapy in the session today.  Interventions: Ego-Supportive, CBT, Social skills training, problem solving  Diagnosis:Major depressive disorder, recurrent episode, moderate (HCC)  Adjustment disorder with mixed anxiety and depressed mood  Plan: Client Abilities/Strengths  Pt is bright and has a good sense of  humor.  Client Treatment Preferences  Individual therapy.  Client Statement of Needs  improve coping skills regarding chronic illness.  Treatment Level  Outpatient Individual therapy  Symptoms  Depressed or irritable mood.: No Description Entered (Status: improved). Diminished interest in or  enjoyment of activities.: No Description Entered (Status: maintained). Feelings of hopelessness,  worthlessness, or inappropriate guilt.: No Description Entered (Status: improved). Lack of energy.: No  Description Entered (Status: maintained). Psychomotor agitation or retardation.: No Description  Entered (Status: maintained). Social withdrawal.: No Description Entered (Status: improved).  Problems Addressed  Unipolar Depression, Unipolar Depression, Unipolar Depression, Unipolar Depression  Goals 1. Alleviate depressive symptoms and return to previous level of effective  functioning. 2. Develop healthy interpersonal relationships that lead to the alleviation  and help prevent the relapse of depression. Objective Describe current and past experiences with depression including their impact on functioning and  attempts to resolve it. Target Date: 2023-05-01 Frequency: Monthly Progress: 60 Modality: individual Related Interventions 1. Encourage the client to share his/her thoughts and feelings of depression; express empathy and  build rapport while identifying primary cognitive, behavioral, interpersonal, or other  contributors to depression. Objective Learn and implement behavioral strategies to overcome depression. Target Date: 2023-05-01 Frequency: Monthly Progress: 80 Modality: individual Objective Identify and replace thoughts and beliefs that support depression. Target Date: 2023-05-01 Frequency: Monthly Progress: 70 Modality: individual Related Interventions 1. Conduct Cognitive-Behavioral Therapy (see Cognitive Behavior Therapy by Reola Calkins; Overcoming Depression by Agapito Games al.),  beginning with helping the  client learn the connection among  cognition, depressive feelings, and actions. 2. Explore and restructure underlying assumptions and beliefs reflected in biased self-talk that  may put the client at risk for relapse or recurrence. 3. Facilitate and reinforce the client's shift from biased depressive self-talk and beliefs to realitybased cognitive messages that enhance self-confidence and increase adaptive actions (see  "Positive Self-Talk" in the Adult Psychotherapy Homework Planner by Stephannie Li). Objective Learn and implement problem-solving and decision-making skills. Target Date: 2023-05-01 Frequency: Monthly Progress: 70 Modality: individual Related Interventions 1. Encourage in the client the development of a positive problem orientation in which problems  and solving them are viewed as a natural part of life and not something to be feared, despaired,  or avoided. 2. Conduct Problem-Solving Therapy (see Problem-Solving Therapy by Domenick Bookbinder and Rob Hickman) using techniques such as psychoeducation, modeling, and role-playing to teach client problem-solving skills (i.e., defining a problem specifically, generating possible solutions, evaluating the pros  and cons of each solution, selecting and implementing a plan of action, evaluating the efficacy  of the plan, accepting or revising the plan); role-play application of the problem-solving skill to  a real life issue (or assign "Applying Problem-Solving to Interpersonal Conflict" in the Adult  Psychotherapy Homework Planner by Stephannie Li). 3. Develop healthy thinking patterns and beliefs about self, others, and the world that lead to the alleviation and help prevent the relapse of  depression. 4. Recognize, accept, and cope with feelings of depression. Diagnosis Axis  none F43.21 (Adjustment Disorder, With depressed mood) - Open -  [Signifier: n/a]  Adjustment Disorder,  With Depressed Mood  Axis  none 296.32  (Major depressive affective disorder, recurrent episode,  moderate) - Open - [Signifier: n/a]  Conditions For Discharge Achievement of treatment goals and objectives   Plan is to continue to offer supportive therapy to patient to help him deal with his depression related to his Parkinsons diagnosis.  Patient approved Treatment Plan   Altheria Shadoan Harrel Lemon, LCSW

## 2023-01-11 ENCOUNTER — Ambulatory Visit: Payer: Medicare PPO | Admitting: Physical Therapy

## 2023-01-11 ENCOUNTER — Encounter: Payer: Self-pay | Admitting: Ophthalmology

## 2023-01-11 NOTE — Discharge Instructions (Signed)
? ?  Cataract Surgery, Care After ? ?This sheet gives you information about how to care for yourself after your surgery.  Your ophthalmologist may also give you more specific instructions.  If you have problems or questions, contact your doctor at [facility], [phone]. ? ?What can I expect after the surgery? ?It is common to have: ?Itching ?Foreign body sensation (feels like a grain of sand in the eye) ?Watery discharge (excess tearing) ?Sensitivity to light and touch ?Bruising in or around the eye ?Mild blurred vision ? ?Follow these instructions at home: ?Do not touch or rub your eyes. ?You may be told to wear a protective shield or sunglasses to protect your eyes. ?Do not put a contact lens in the operative eye unless your doctor approves. ?Keep the lids and face clean and dry. ?Do not allow water to hit you directly in the face while showering. ?Keep soap and shampoo out of your eyes. ?Do not use eye makeup for 1 week. ? ?Check your eye every day for signs of infection.  Watch for: ?Redness, swelling, or pain. ?Fluid, blood or pus. ?Worsening vision. ?Worsening sensitivity to light or touch. ? ?Activity: ?During the first day, avoid bending over and reading.  You may resume reading and bending the next day. ?Do not drive or use heavy machinery for at least 24 hours. ?Avoid strenuous activities for 1 week.  Activities such as walking, treadmill, exercise bike, and climbing stairs are okay. ?Do not lift heavy (>20 pound) objects for 1 week. ?Do not do yardwork, gardening, or dirty housework (mopping, cleaning bathrooms, vacuuming, etc.) for 1 week. ?Do not swim or use a hot tub for 2 weeks. ?Ask your doctor when you can return to work. ? ?General Instructions: ?Take or apply prescription and over-the-counter medicines as directed by your doctor, including eyedrops and ointments. ?Resume medications discontinued prior to surgery, unless told otherwise by your doctor. ?Keep all follow up appointments as  scheduled. ? ?Contact a health care provider if: ?You have increased bruising around your eye. ?You have pain that is not helped with medication. ?You have a fever. ?You have fluid, pus, or blood coming from your eye or incision. ?Your sensitivity to light gets worse. ?You have spots (floaters) of flashing lights in your vision. ?You have nausea or vomiting. ? ?Go to the nearest emergency room or call 911 if: ?You have sudden loss of vision. ?You have severe, worsening eye pain. ? ?

## 2023-01-11 NOTE — Anesthesia Preprocedure Evaluation (Addendum)
Anesthesia Evaluation  Patient identified by MRN, date of birth, ID band Patient awake    Reviewed: Allergy & Precautions, H&P , NPO status , Patient's Chart, lab work & pertinent test results  Airway Mallampati: III  TM Distance: >3 FB Neck ROM: Full    Dental no notable dental hx.  Permanent bridge, intact :   Pulmonary shortness of breath, sleep apnea    Pulmonary exam normal breath sounds clear to auscultation       Cardiovascular hypertension, Normal cardiovascular exam+ dysrhythmias + pacemaker  Rhythm:Regular Rate:Normal  11-19-20 1. Left ventricular ejection fraction, by estimation, is 55 to 60%. The  left ventricle has normal function. The left ventricle has no regional  wall motion abnormalities. Left ventricular diastolic parameters were  normal.   2. Right ventricular systolic function is normal. The right ventricular  size is normal.   3. The mitral valve is normal in structure. Trivial mitral valve  regurgitation.   4. The aortic valve is tricuspid. Aortic valve regurgitation is mild.   11-21-22 pacemaker interrogated   Neuro/Psych  Headaches PSYCHIATRIC DISORDERS Anxiety Depression   Dementia  negative psych ROS   GI/Hepatic Neg liver ROS,GERD  ,,  Endo/Other  negative endocrine ROS    Renal/GU negative Renal ROS  negative genitourinary   Musculoskeletal  (+) Arthritis ,    Abdominal   Peds negative pediatric ROS (+)  Hematology negative hematology ROS (+)   Anesthesia Other Findings Parkinson's disease  PTSD (post-traumatic stress disorder) Major depressive disorder Unspecified dementia, moderate, with psychotic disturbance Anxiety disorder Bradycardia  Tachycardia-bradycardia syndrome Hypertension Sleep apnea  Varicose veins Cancer (HCC)  Arthritis History of chicken pox  GERD (gastroesophageal reflux disease) Dysrhythmia  Depression Headache(784.0)  Shortness of breath  dyspnea History of kidney stones  Pacemaker Deep brain stimulator---DBS needs to be turned off for any surgery which requires monopolar--should avoid bipolar w/DBS   Reproductive/Obstetrics negative OB ROS                             Anesthesia Physical Anesthesia Plan  ASA: 3  Anesthesia Plan: MAC   Post-op Pain Management:    Induction: Intravenous  PONV Risk Score and Plan:   Airway Management Planned: Natural Airway and Nasal Cannula  Additional Equipment:   Intra-op Plan:   Post-operative Plan:   Informed Consent: I have reviewed the patients History and Physical, chart, labs and discussed the procedure including the risks, benefits and alternatives for the proposed anesthesia with the patient or authorized representative who has indicated his/her understanding and acceptance.     Dental Advisory Given  Plan Discussed with: Anesthesiologist, CRNA and Surgeon  Anesthesia Plan Comments: (Patient consented for risks of anesthesia including but not limited to:  - adverse reactions to medications - damage to eyes, teeth, lips or other oral mucosa - nerve damage due to positioning  - sore throat or hoarseness - Damage to heart, brain, nerves, lungs, other parts of body or loss of life  Patient voiced understanding.)        Anesthesia Quick Evaluation

## 2023-01-16 ENCOUNTER — Ambulatory Visit: Payer: Medicare PPO | Admitting: Physical Therapy

## 2023-01-17 ENCOUNTER — Encounter
Admission: RE | Disposition: A | Discharge status: Home / Self Care | Payer: Self-pay | Source: Ambulatory Visit | Attending: Ophthalmology

## 2023-01-17 ENCOUNTER — Ambulatory Visit
Admission: RE | Admit: 2023-01-17 | Discharge: 2023-01-17 | Disposition: A | Discharge status: Home / Self Care | Payer: Medicare PPO | Source: Ambulatory Visit | Attending: Ophthalmology | Admitting: Ophthalmology

## 2023-01-17 ENCOUNTER — Other Ambulatory Visit: Payer: Self-pay

## 2023-01-17 ENCOUNTER — Ambulatory Visit: Payer: Medicare PPO | Admitting: Anesthesiology

## 2023-01-17 ENCOUNTER — Encounter: Payer: Self-pay | Admitting: Ophthalmology

## 2023-01-17 DIAGNOSIS — G20A1 Parkinson's disease without dyskinesia, without mention of fluctuations: Secondary | ICD-10-CM | POA: Diagnosis not present

## 2023-01-17 DIAGNOSIS — R0602 Shortness of breath: Secondary | ICD-10-CM | POA: Diagnosis not present

## 2023-01-17 DIAGNOSIS — I351 Nonrheumatic aortic (valve) insufficiency: Secondary | ICD-10-CM | POA: Insufficient documentation

## 2023-01-17 DIAGNOSIS — H2511 Age-related nuclear cataract, right eye: Secondary | ICD-10-CM | POA: Diagnosis not present

## 2023-01-17 DIAGNOSIS — M1990 Unspecified osteoarthritis, unspecified site: Secondary | ICD-10-CM | POA: Insufficient documentation

## 2023-01-17 DIAGNOSIS — R519 Headache, unspecified: Secondary | ICD-10-CM | POA: Diagnosis not present

## 2023-01-17 DIAGNOSIS — G4730 Sleep apnea, unspecified: Secondary | ICD-10-CM | POA: Insufficient documentation

## 2023-01-17 DIAGNOSIS — F419 Anxiety disorder, unspecified: Secondary | ICD-10-CM | POA: Insufficient documentation

## 2023-01-17 DIAGNOSIS — Z87891 Personal history of nicotine dependence: Secondary | ICD-10-CM | POA: Diagnosis not present

## 2023-01-17 DIAGNOSIS — K219 Gastro-esophageal reflux disease without esophagitis: Secondary | ICD-10-CM | POA: Insufficient documentation

## 2023-01-17 DIAGNOSIS — F32A Depression, unspecified: Secondary | ICD-10-CM | POA: Insufficient documentation

## 2023-01-17 DIAGNOSIS — I10 Essential (primary) hypertension: Secondary | ICD-10-CM | POA: Insufficient documentation

## 2023-01-17 HISTORY — DX: Nonrheumatic aortic (valve) insufficiency: I35.1

## 2023-01-17 HISTORY — DX: Anxiety disorder, unspecified: F41.9

## 2023-01-17 HISTORY — DX: Personal history of other diseases of the circulatory system: Z86.79

## 2023-01-17 HISTORY — PX: CATARACT EXTRACTION W/PHACO: SHX586

## 2023-01-17 HISTORY — DX: Major depressive disorder, single episode, unspecified: F32.9

## 2023-01-17 HISTORY — DX: Presence of other specified functional implants: Z96.89

## 2023-01-17 HISTORY — DX: Unspecified dementia, moderate, with psychotic disturbance: F03.B2

## 2023-01-17 SURGERY — PHACOEMULSIFICATION, CATARACT, WITH IOL INSERTION
Anesthesia: Monitor Anesthesia Care | Laterality: Right

## 2023-01-17 MED ORDER — TETRACAINE HCL 0.5 % OP SOLN
1.0000 [drp] | OPHTHALMIC | Status: DC | PRN
  Administered 2023-01-17 (×3): 1 [drp] via OPHTHALMIC

## 2023-01-17 MED ORDER — FENTANYL CITRATE (PF) 100 MCG/2ML IJ SOLN
INTRAMUSCULAR | Status: DC | PRN
  Administered 2023-01-17 (×2): 50 ug via INTRAVENOUS

## 2023-01-17 MED ORDER — LACTATED RINGERS IV SOLN: INTRAVENOUS | Status: DC

## 2023-01-17 MED ORDER — MIDAZOLAM HCL 2 MG/2ML IJ SOLN
INTRAMUSCULAR | Status: DC | PRN
  Administered 2023-01-17: 2 mg via INTRAVENOUS

## 2023-01-17 MED ORDER — FLUMAZENIL NICU IV SYRINGE 0.1 MG/ML
0.2000 mg | INTRAVENOUS | Status: DC | PRN
  Administered 2023-01-17: 0.2 mg via INTRAVENOUS

## 2023-01-17 MED ORDER — SIGHTPATH DOSE#1 BSS IO SOLN
INTRAOCULAR | Status: DC | PRN
  Administered 2023-01-17: 1 mL

## 2023-01-17 MED ORDER — ARMC OPHTHALMIC DILATING DROPS
OPHTHALMIC | Status: AC
  Filled 2023-01-17: qty 0.5

## 2023-01-17 MED ORDER — SIGHTPATH DOSE#1 BSS IO SOLN
INTRAOCULAR | Status: DC | PRN
  Administered 2023-01-17: 48 mL via OPHTHALMIC

## 2023-01-17 MED ORDER — SIGHTPATH DOSE#1 BSS IO SOLN
INTRAOCULAR | Status: DC | PRN
  Administered 2023-01-17: 15 mL via INTRAOCULAR

## 2023-01-17 MED ORDER — NALOXONE HCL 0.4 MG/ML IJ SOLN
0.2000 mg | INTRAMUSCULAR | Status: AC | PRN
  Administered 2023-01-17 (×2): 0.2 mg via INTRAVENOUS

## 2023-01-17 MED ORDER — MIDAZOLAM HCL 2 MG/2ML IJ SOLN
INTRAMUSCULAR | Status: AC
  Filled 2023-01-17: qty 2

## 2023-01-17 MED ORDER — BRIMONIDINE TARTRATE-TIMOLOL 0.2-0.5 % OP SOLN
OPHTHALMIC | Status: DC | PRN
  Administered 2023-01-17: 1 [drp] via OPHTHALMIC

## 2023-01-17 MED ORDER — SIGHTPATH DOSE#1 NA CHONDROIT SULF-NA HYALURON 40-17 MG/ML IO SOLN
INTRAOCULAR | Status: DC | PRN
  Administered 2023-01-17: 1 mL via INTRAOCULAR

## 2023-01-17 MED ORDER — TETRACAINE HCL 0.5 % OP SOLN
OPHTHALMIC | Status: AC
  Filled 2023-01-17: qty 4

## 2023-01-17 MED ORDER — FLUMAZENIL 0.5 MG/5ML IV SOLN
INTRAVENOUS | Status: AC
  Filled 2023-01-17: qty 5

## 2023-01-17 MED ORDER — ARMC OPHTHALMIC DILATING DROPS
1.0000 | OPHTHALMIC | Status: DC | PRN
Start: 2023-01-17 — End: 2023-01-17
  Administered 2023-01-17 (×3): 1 via OPHTHALMIC

## 2023-01-17 MED ORDER — MOXIFLOXACIN HCL 0.5 % OP SOLN
OPHTHALMIC | Status: DC | PRN
  Administered 2023-01-17: .2 mL via OPHTHALMIC

## 2023-01-17 MED ORDER — FENTANYL CITRATE (PF) 100 MCG/2ML IJ SOLN
INTRAMUSCULAR | Status: AC
  Filled 2023-01-17: qty 2

## 2023-01-17 SURGICAL SUPPLY — 10 items
ANGLE REVERSE CUT SHRT 25GA (CUTTER) ×1
CATARACT SUITE SIGHTPATH (MISCELLANEOUS) ×1
CYSTOTOME ANGL RVRS SHRT 25G (CUTTER) ×1 IMPLANT
FEE CATARACT SUITE SIGHTPATH (MISCELLANEOUS) ×1 IMPLANT
GLOVE BIOGEL PI IND STRL 8 (GLOVE) ×1 IMPLANT
GLOVE SURG LX STRL 8.0 MICRO (GLOVE) ×1 IMPLANT
LENS IOL TECNIS EYHANCE 20.5 (Intraocular Lens) IMPLANT
NDL FILTER BLUNT 18X1 1/2 (NEEDLE) ×1 IMPLANT
NEEDLE FILTER BLUNT 18X1 1/2 (NEEDLE) ×1
SYR 3ML LL SCALE MARK (SYRINGE) ×1 IMPLANT

## 2023-01-17 NOTE — Anesthesia Postprocedure Evaluation (Signed)
Anesthesia Post Note  Patient: [name]  Procedure(s) Performed: CATARACT EXTRACTION PHACO AND INTRAOCULAR LENS PLACEMENT (IOC) RIGHT 6.33 00:42.6 (Right)  Patient location during evaluation: PACU Anesthesia Type: MAC Level of consciousness: awake and alert Pain management: pain level controlled Vital Signs Assessment: post-procedure vital signs reviewed and stable Respiratory status: spontaneous breathing, nonlabored ventilation, respiratory function stable and patient connected to nasal cannula oxygen Cardiovascular status: stable and blood pressure returned to baseline Postop Assessment: no apparent nausea or vomiting Anesthetic complications: no   No notable events documented.   Last Vitals:  Vitals:   01/17/23 1159 01/17/23 1200  BP: 117/77 117/77  Pulse: 61 61  Resp: 16   Temp:  (!) 36.3 C  SpO2: 97% 98%    Last Pain:  Vitals:   01/17/23 1159  TempSrc:   PainSc: 0-No pain                 Jayden Kratochvil C Latese Dufault

## 2023-01-17 NOTE — Transfer of Care (Signed)
Immediate Anesthesia Transfer of Care Note  Patient: [name]  Procedure(s) Performed: CATARACT EXTRACTION PHACO AND INTRAOCULAR LENS PLACEMENT (IOC) RIGHT 6.33 00:42.6 (Right)  Patient Location: PACU  Anesthesia Type: MAC  Level of Consciousness: awake, alert  and patient cooperative  Airway and Oxygen Therapy: Patient Spontanous Breathing and Patient connected to supplemental oxygen  Post-op Assessment: Post-op Vital signs reviewed, Patient's Cardiovascular Status Stable, Respiratory Function Stable, Patent Airway and No signs of Nausea or vomiting  Post-op Vital Signs: Reviewed and stable  Complications: No notable events documented.

## 2023-01-17 NOTE — H&P (Signed)
[facility]   Primary Care Physician:  Joaquim Nam, MD Ophthalmologist: Dr. Maren Reamer  Pre-Procedure History & Physical: HPI:  [name] is a 72 y.o. male here for cataract surgery.   Past Medical History:  Diagnosis Date   Anxiety disorder    Arthritis    Bradycardia    Cancer (HCC) 2013   skin cancer   Depression    ptsd   Dysrhythmia    chronic slow heart rate   GERD (gastroesophageal reflux disease)    H/O tachycardia-bradycardia syndrome    Headache(784.0)    tension headaches non recent   History of chicken pox    History of kidney stones    passed   Hypertension    treated with HCTZ   Major depressive disorder    Mild aortic regurgitation    Pacemaker    Parkinson's disease (HCC)    dx'ed 15 years ago   PTSD (post-traumatic stress disorder)    S/P deep brain stimulator placement    Shortness of breath dyspnea    Sleep apnea    doesn't use C-pap   Unspecified dementia, moderate, with psychotic disturbance (HCC)    Varicose veins     Past Surgical History:  Procedure Laterality Date   CHOLECYSTECTOMY N/A 10/22/2014   Procedure: LAPAROSCOPIC CHOLECYSTECTOMY WITH INTRAOPERATIVE CHOLANGIOGRAM;  Surgeon: Tiney Rouge III, MD;  Location: [facility] ORS;  Service: General;  Laterality: N/A;   cyst removed      from lip as a child   INTRAMEDULLARY (IM) NAIL INTERTROCHANTERIC N/A 02/18/2019   Procedure: INTRAMEDULLARY (IM) NAIL INTERTROCHANTRIC, RIGHT,;  Surgeon: Juanell Fairly, MD;  Location: [facility] ORS;  Service: Orthopedics;  Laterality: N/A;   LUMBAR LAMINECTOMY/DECOMPRESSION MICRODISCECTOMY Bilateral 12/14/2012   Procedure: Bilateral lumbar three-four, four-five decompressive laminotomy/foraminotomy;  Surgeon: Temple Pacini, MD;  Location: [facility];  Service: Neurosurgery;  Laterality: Bilateral;   PULSE GENERATOR IMPLANT Bilateral 12/13/2013   Procedure: Bilateral implantable pulse generator placement;  Surgeon: Maeola Harman, MD;  Location: [facility];   Service: Neurosurgery;  Laterality: Bilateral;  Bilateral implantable pulse generator placement   skin cancer removed     from ears,   12 lft arm  rt leg 15   SUBTHALAMIC STIMULATOR BATTERY REPLACEMENT Bilateral 07/14/2017   Procedure: BILATERAL IMPLANTED PULSE GENERATOR CHANGE FOR DEEP BRAIN STIMULATOR;  Surgeon: Maeola Harman, MD;  Location: [facility] OR;  Service: Neurosurgery;  Laterality: Bilateral;   SUBTHALAMIC STIMULATOR INSERTION Bilateral 12/06/2013   Procedure: SUBTHALAMIC STIMULATOR INSERTION;  Surgeon: Maeola Harman, MD;  Location: [facility];  Service: Neurosurgery;  Laterality: Bilateral;  Bilateral deep brain stimulator placement    Prior to Admission medications   Medication Sig Start Date End Date Taking? Authorizing Provider  acetaminophen (TYLENOL) 325 MG tablet Take 650 mg by mouth every 6 (six) hours as needed.   Yes [provider]  ARIPiprazole (ABILIFY) 2 MG tablet Take 2 mg by mouth daily. Taking 1/2 by mouth per [place]   Yes [provider]  aspirin EC 81 MG tablet Take 1 tablet (81 mg total) by mouth daily. Swallow whole. 11/18/19  Yes Alver Sorrow, NP  Carbidopa-Levodopa ER (RYTARY) 48.75-195 MG CPCR Take 2 capsules by mouth 3 (three) times daily. 08/12/21  Yes Joaquim Nam, MD  Cenobamate 25 MG TABS Take by mouth daily.   Yes [provider]  cholecalciferol (VITAMIN D) 1000 UNITS tablet Take 1,000 Units by mouth daily.   Yes [provider]  Cranberry  200 MG CAPS Take by mouth.   Yes [provider]  cycloSPORINE (RESTASIS) 0.05 % ophthalmic emulsion SMARTSIG:In Eye(s) 01/08/22  Yes [provider]  diclofenac Sodium (VOLTAREN) 1 % GEL Apply topically 4 (four) times daily.   Yes [provider]  escitalopram (LEXAPRO) 20 MG tablet TAKE 1 TABLET DAILY Patient taking differently: Take 5 mg by mouth daily. 01/21/22  Yes Joaquim Nam, MD  Melatonin 10 MG TABS Take 10 mg by mouth at bedtime.   Yes  [provider]  memantine (NAMENDA) 10 MG tablet TAKE 1 TABLET TWICE A DAY 01/26/22  Yes Joaquim Nam, MD  Nystatin POWD Use twice a day if needed. 10/07/21  Yes Joaquim Nam, MD  omeprazole (PRILOSEC) 20 MG capsule TAKE 1 CAPSULE DAILY 06/10/21  Yes Joaquim Nam, MD  rivastigmine (EXELON) 9.5 mg/24hr 9.5 mg daily. 09/22/19  Yes [provider]  fluticasone (FLONASE) 50 MCG/ACT nasal spray Place 2 sprays into both nostrils daily. Patient not taking: Reported on 01/11/2023 10/07/21   Joaquim Nam, MD    Allergies as of 12/13/2022 - Review Complete 06/29/2022  Allergen Reaction Noted   Clonazepam Other (See Comments) and Photosensitivity 11/30/2014   Morphine and codeine Other (See Comments) 11/28/2014   Nuplazid [pimavanserin tartrate] Other (See Comments) 10/07/2021   Pimavanserin Other (See Comments) 10/07/2021   Prednisone Other (See Comments) 09/02/2016   Tramadol  06/15/2020    Family History  Problem Relation Age of Onset   Lung cancer Father    Colon cancer Neg Hx    Prostate cancer Neg Hx     Social History   Socioeconomic History   Marital status: Married    Spouse name: Not on file   Number of children: Not on file   Years of education: Not on file   Highest education level: Not on file  Occupational History   Occupation: disabled    Comment: 2001, [employer]    Comment: search and rescue helicopter  Tobacco Use   Smoking status: Never   Smokeless tobacco: Former    Quit date: 2015  Vaping Use   Vaping status: Never Used  Substance and Sexual Activity   Alcohol use: Yes    Alcohol/week: 0.0 standard drinks of alcohol    Comment: occasional (twice per month)   Drug use: No   Sexual activity: Not Currently  Other Topics Concern   Not on file  Social History Narrative   Previous [employer], 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  Prev search and rescue work   3 kids from prev relationship.    Married 2014   Social Determinants of Health    Financial Resource Strain: Low Risk  (05/05/2022)   Overall Financial Resource Strain (CARDIA)    Difficulty of Paying Living Expenses: Not hard at all  Food Insecurity: No Food Insecurity (05/05/2022)   Hunger Vital Sign    Worried About Running Out of Food in the Last Year: Never true    Ran Out of Food in the Last Year: Never true  Transportation Needs: No Transportation Needs (05/05/2022)   PRAPARE - Administrator, Civil Service (Medical): No    Lack of Transportation (Non-Medical): No  Physical Activity: Sufficiently Active (05/05/2022)   Exercise Vital Sign    Days of Exercise per Week: 4 days    Minutes of Exercise per Session: 50 min  Stress: No Stress Concern Present (05/05/2022)   [employer] of Occupational Health -  Occupational Stress Questionnaire    Feeling of Stress : Not at all  Social Connections: Moderately Integrated (05/05/2022)   Social Connection and Isolation Panel [NHANES]    Frequency of Communication with Friends and Family: Twice a week    Frequency of Social Gatherings with Friends and Family: Twice a week    Attends Religious Services: More than 4 times per year    Active Member of [employer] or Organizations: No    Attends Banker Meetings: Never    Marital Status: Married  Catering manager Violence: Not At Risk (05/05/2022)   Humiliation, Afraid, Rape, and Kick questionnaire    Fear of Current or Ex-Partner: No    Emotionally Abused: No    Physically Abused: No    Sexually Abused: No    Review of Systems: See HPI, otherwise negative ROS  Physical Exam: BP (!) 143/84   Pulse 76   Temp 97.8 F (36.6 C) (Temporal)   Resp 15   Ht 5\' 9"  (1.753 m)   Wt 81.6 kg   SpO2 99%   BMI 26.58 kg/m  General:   Alert, cooperative in NAD Head:  Normocephalic and atraumatic. Respiratory:  Normal work of breathing. Cardiovascular:  RRR  Impression/Plan: Montez Morita Brosch is here for cataract surgery.  Risks, benefits, limitations,  and alternatives regarding cataract surgery have been reviewed with the patient.  Questions have been answered.  All parties agreeable.   Galen Manila, MD  01/17/2023, 10:50 AM

## 2023-01-17 NOTE — Op Note (Signed)
PREOPERATIVE DIAGNOSIS:  Nuclear sclerotic cataract of the right eye.   POSTOPERATIVE DIAGNOSIS:  Cataract   OPERATIVE PROCEDURE:ORPROCALL@   SURGEON:  George Manila, MD.   ANESTHESIA:  Anesthesiologist: Marisue Humble, MD CRNA: Domenic Moras, CRNA  1.      Managed anesthesia care. 2.      0.38ml of Shugarcaine was instilled in the eye following the paracentesis.   COMPLICATIONS:  None.   TECHNIQUE:   Stop and chop   DESCRIPTION OF PROCEDURE:  The patient was examined and consented in the preoperative holding area where the aforementioned topical anesthesia was applied to the right eye and then brought back to the Operating Room where the right eye was prepped and draped in the usual sterile ophthalmic fashion and a lid speculum was placed. A paracentesis was created with the side port blade and the anterior chamber was filled with viscoelastic. A near clear corneal incision was performed with the steel keratome. A continuous curvilinear capsulorrhexis was performed with a cystotome followed by the capsulorrhexis forceps. Hydrodissection and hydrodelineation were carried out with BSS on a blunt cannula. The lens was removed in a stop and chop  technique and the remaining cortical material was removed with the irrigation-aspiration handpiece. The capsular bag was inflated with viscoelastic and the Technis [id number]  lens was placed in the capsular bag without complication. The remaining viscoelastic was removed from the eye with the irrigation-aspiration handpiece. The wounds were hydrated. The anterior chamber was flushed with BSS and the eye was inflated to physiologic pressure. 0.83ml of Vigamox was placed in the anterior chamber. The wounds were found to be water tight. The eye was dressed with Combigan. The patient was given protective glasses to wear throughout the day and a shield with which to sleep tonight. The patient was also given drops with which to begin a drop regimen today and  will follow-up with me in one day. Implant Name Type Inv. Item Serial No. Manufacturer Lot No. LRB No. Used Action  LENS IOL TECNIS EYHANCE 20.5 - [id number] Intraocular Lens LENS IOL TECNIS EYHANCE 20.5 [id number] [employer]  Right 1 Implanted   Procedure(s): CATARACT EXTRACTION PHACO AND INTRAOCULAR LENS PLACEMENT (IOC) RIGHT 6.33 00:42.6 (Right)  Electronically signed: Galen Mcgee 01/17/2023 11:12 AM

## 2023-01-18 ENCOUNTER — Ambulatory Visit: Payer: Medicare PPO | Admitting: Physical Therapy

## 2023-01-23 ENCOUNTER — Ambulatory Visit: Payer: Medicare PPO | Admitting: Physical Therapy

## 2023-01-25 ENCOUNTER — Ambulatory Visit: Payer: Medicare PPO | Admitting: Physical Therapy

## 2023-01-30 ENCOUNTER — Ambulatory Visit: Payer: Medicare PPO | Admitting: Physical Therapy

## 2023-01-31 ENCOUNTER — Ambulatory Visit (INDEPENDENT_AMBULATORY_CARE_PROVIDER_SITE_OTHER): Payer: Medicare PPO | Admitting: Psychology

## 2023-01-31 DIAGNOSIS — F4323 Adjustment disorder with mixed anxiety and depressed mood: Secondary | ICD-10-CM | POA: Diagnosis not present

## 2023-01-31 DIAGNOSIS — F331 Major depressive disorder, recurrent, moderate: Secondary | ICD-10-CM | POA: Diagnosis not present

## 2023-01-31 NOTE — Progress Notes (Signed)
[facility] Counselor/Therapist Progress Note  Patient ID: SHEDRIC FREDERICKS, MRN: [id number],    Date: 01/31/2023  Time Spent: 32 minutes  Time in:  4:00  Time out:  4:32  Treatment Type: Indivdual  Reported Symptoms: sadness, and boredom  Mental Status Exam: Appearance:  Casual     Behavior: normal  Motor: Psychomotor Retardation  Speech/Language:  Slow  Affect: flat  Mood: pleasant  Thought process: concrete  Thought content:   WNL  Sensory/Perceptual disturbances:   WNL  Orientation: oriented to person, place, time/date, and situation  Attention: Fair  Concentration: Fair  Memory: Immediate;   Fair  Progress Energy of knowledge:  Fair  Insight:   Fair  Judgment:  Fair  Impulse Control: Fair   Risk Assessment: Danger to Self:  No Self-injurious Behavior: No Danger to Others: No Duty to Warn:no Physical Aggression / Violence:No  Access to Firearms a concern: No  Gang Involvement:No   Subjective: The patient attended a face-to-face individual therapy session  via video visit today.  The patient gave verbal consent for the video to be on Caregility and his wife is aware of limitations of telehealth.  The patient was in his home  and therapist was in the office.  The patient presents as pleasant and cooperative.  The patient reports that he was out working with a friend's car.  He says he was changing the oil.  He was a bit distracted today and he reported that he was doing okay.  He answered questions in one word answers.he seemed like he wanted to get back to what he was doing.  He reports that he is not having any significant problems with depression right now and we ended the session relatively early because he seemed distracted and wanted to go back out and work on the car that he was working on.    Interventions: Ego-Supportive, CBT, Social skills training, problem solving  Diagnosis:Major depressive disorder, recurrent episode, moderate (HCC)  Adjustment  disorder with mixed anxiety and depressed mood  Plan: Client Abilities/Strengths  Pt is bright and has a good sense of humor.  Client Treatment Preferences  Individual therapy.  Client Statement of Needs  improve coping skills regarding chronic illness.  Treatment Level  Outpatient Individual therapy  Symptoms  Depressed or irritable mood.: No Description Entered (Status: improved). Diminished interest in or  enjoyment of activities.: No Description Entered (Status: maintained). Feelings of hopelessness,  worthlessness, or inappropriate guilt.: No Description Entered (Status: improved). Lack of energy.: No  Description Entered (Status: maintained). Psychomotor agitation or retardation.: No Description  Entered (Status: maintained). Social withdrawal.: No Description Entered (Status: improved).  Problems Addressed  Unipolar Depression, Unipolar Depression, Unipolar Depression, Unipolar Depression  Goals 1. Alleviate depressive symptoms and return to previous level of effective  functioning. 2. Develop healthy interpersonal relationships that lead to the alleviation  and help prevent the relapse of depression. Objective Describe current and past experiences with depression including their impact on functioning and  attempts to resolve it. Target Date: 2023-05-01 Frequency: Monthly Progress: 60 Modality: individual Related Interventions 1. Encourage the client to share his/her thoughts and feelings of depression; express empathy and  build rapport while identifying primary cognitive, behavioral, interpersonal, or other  contributors to depression. Objective Learn and implement behavioral strategies to overcome depression. Target Date: 2023-05-01 Frequency: Monthly Progress: 80 Modality: individual Objective Identify and replace thoughts and beliefs that support depression. Target Date: 2023-05-01 Frequency: Monthly Progress: 70 Modality: individual Related Interventions 1.  Conduct Cognitive-Behavioral Therapy (see Cognitive Behavior Therapy by Reola Calkins; Overcoming Depression by Agapito Games al.), beginning with helping the client learn the connection among  cognition, depressive feelings, and actions. 2. Explore and restructure underlying assumptions and beliefs reflected in biased self-talk that  may put the client at risk for relapse or recurrence. 3. Facilitate and reinforce the client's shift from biased depressive self-talk and beliefs to realitybased cognitive messages that enhance self-confidence and increase adaptive actions (see  "Positive Self-Talk" in the Adult Psychotherapy Homework Planner by Stephannie Li). Objective Learn and implement problem-solving and decision-making skills. Target Date: 2023-05-01 Frequency: Monthly Progress: 70 Modality: individual Related Interventions 1. Encourage in the client the development of a positive problem orientation in which problems  and solving them are viewed as a natural part of life and not something to be feared, despaired,  or avoided. 2. Conduct Problem-Solving Therapy (see Problem-Solving Therapy by Domenick Bookbinder and Rob Hickman) using techniques such as psychoeducation, modeling, and role-playing to teach client problem-solving skills (i.e., defining a problem specifically, generating possible solutions, evaluating the pros  and cons of each solution, selecting and implementing a plan of action, evaluating the efficacy  of the plan, accepting or revising the plan); role-play application of the problem-solving skill to  a real life issue (or assign "Applying Problem-Solving to Interpersonal Conflict" in the Adult  Psychotherapy Homework Planner by Stephannie Li). 3. Develop healthy thinking patterns and beliefs about self, others, and the world that lead to the alleviation and help prevent the relapse of  depression. 4. Recognize, accept, and cope with feelings of depression. Diagnosis Axis  none F43.21 (Adjustment Disorder,  With depressed mood) - Open -  [Signifier: n/a]  Adjustment Disorder,  With Depressed Mood  Axis  none 296.32 (Major depressive affective disorder, recurrent episode,  moderate) - Open - [Signifier: n/a]  Conditions For Discharge Achievement of treatment goals and objectives   Plan is to continue to offer supportive therapy to patient to help him deal with his depression related to his Parkinsons diagnosis.  Patient approved Treatment Plan   Alphonsine Minium G Anahit Klumb, LCSW                                                                                                        Kassadi Presswood G Geisha Abernathy, LCSW

## 2023-02-01 ENCOUNTER — Ambulatory Visit: Payer: Medicare PPO | Admitting: Physical Therapy

## 2023-02-02 NOTE — Discharge Instructions (Signed)
? ?  Cataract Surgery, Care After ? ?This sheet gives you information about how to care for yourself after your surgery.  Your ophthalmologist may also give you more specific instructions.  If you have problems or questions, contact your doctor at [facility], [phone]. ? ?What can I expect after the surgery? ?It is common to have: ?Itching ?Foreign body sensation (feels like a grain of sand in the eye) ?Watery discharge (excess tearing) ?Sensitivity to light and touch ?Bruising in or around the eye ?Mild blurred vision ? ?Follow these instructions at home: ?Do not touch or rub your eyes. ?You may be told to wear a protective shield or sunglasses to protect your eyes. ?Do not put a contact lens in the operative eye unless your doctor approves. ?Keep the lids and face clean and dry. ?Do not allow water to hit you directly in the face while showering. ?Keep soap and shampoo out of your eyes. ?Do not use eye makeup for 1 week. ? ?Check your eye every day for signs of infection.  Watch for: ?Redness, swelling, or pain. ?Fluid, blood or pus. ?Worsening vision. ?Worsening sensitivity to light or touch. ? ?Activity: ?During the first day, avoid bending over and reading.  You may resume reading and bending the next day. ?Do not drive or use heavy machinery for at least 24 hours. ?Avoid strenuous activities for 1 week.  Activities such as walking, treadmill, exercise bike, and climbing stairs are okay. ?Do not lift heavy (>20 pound) objects for 1 week. ?Do not do yardwork, gardening, or dirty housework (mopping, cleaning bathrooms, vacuuming, etc.) for 1 week. ?Do not swim or use a hot tub for 2 weeks. ?Ask your doctor when you can return to work. ? ?General Instructions: ?Take or apply prescription and over-the-counter medicines as directed by your doctor, including eyedrops and ointments. ?Resume medications discontinued prior to surgery, unless told otherwise by your doctor. ?Keep all follow up appointments as  scheduled. ? ?Contact a health care provider if: ?You have increased bruising around your eye. ?You have pain that is not helped with medication. ?You have a fever. ?You have fluid, pus, or blood coming from your eye or incision. ?Your sensitivity to light gets worse. ?You have spots (floaters) of flashing lights in your vision. ?You have nausea or vomiting. ? ?Go to the nearest emergency room or call 911 if: ?You have sudden loss of vision. ?You have severe, worsening eye pain. ? ?

## 2023-02-03 ENCOUNTER — Telehealth: Payer: Self-pay

## 2023-02-03 NOTE — Telephone Encounter (Signed)
Pt transferred to [place]

## 2023-02-06 ENCOUNTER — Ambulatory Visit: Payer: Medicare PPO | Admitting: Physical Therapy

## 2023-02-07 ENCOUNTER — Ambulatory Visit: Payer: Medicare PPO | Admitting: Anesthesiology

## 2023-02-07 ENCOUNTER — Encounter
Admission: RE | Disposition: A | Discharge status: Home / Self Care | Payer: Self-pay | Source: Home / Self Care | Attending: Ophthalmology

## 2023-02-07 ENCOUNTER — Other Ambulatory Visit: Payer: Self-pay

## 2023-02-07 ENCOUNTER — Ambulatory Visit
Admission: RE | Admit: 2023-02-07 | Discharge: 2023-02-07 | Disposition: A | Discharge status: Home / Self Care | Payer: Medicare PPO | Attending: Ophthalmology | Admitting: Ophthalmology

## 2023-02-07 ENCOUNTER — Encounter: Payer: Self-pay | Admitting: Ophthalmology

## 2023-02-07 DIAGNOSIS — G4730 Sleep apnea, unspecified: Secondary | ICD-10-CM | POA: Insufficient documentation

## 2023-02-07 DIAGNOSIS — F02B4 Dementia in other diseases classified elsewhere, moderate, with anxiety: Secondary | ICD-10-CM | POA: Diagnosis not present

## 2023-02-07 DIAGNOSIS — H269 Unspecified cataract: Secondary | ICD-10-CM | POA: Diagnosis not present

## 2023-02-07 DIAGNOSIS — Z950 Presence of cardiac pacemaker: Secondary | ICD-10-CM | POA: Diagnosis not present

## 2023-02-07 DIAGNOSIS — K219 Gastro-esophageal reflux disease without esophagitis: Secondary | ICD-10-CM | POA: Diagnosis not present

## 2023-02-07 DIAGNOSIS — H2512 Age-related nuclear cataract, left eye: Secondary | ICD-10-CM | POA: Diagnosis not present

## 2023-02-07 DIAGNOSIS — Z9682 Presence of neurostimulator: Secondary | ICD-10-CM | POA: Diagnosis not present

## 2023-02-07 DIAGNOSIS — I10 Essential (primary) hypertension: Secondary | ICD-10-CM | POA: Insufficient documentation

## 2023-02-07 DIAGNOSIS — G20A1 Parkinson's disease without dyskinesia, without mention of fluctuations: Secondary | ICD-10-CM | POA: Diagnosis not present

## 2023-02-07 DIAGNOSIS — Z87891 Personal history of nicotine dependence: Secondary | ICD-10-CM | POA: Insufficient documentation

## 2023-02-07 DIAGNOSIS — F02B2 Dementia in other diseases classified elsewhere, moderate, with psychotic disturbance: Secondary | ICD-10-CM | POA: Insufficient documentation

## 2023-02-07 HISTORY — PX: CATARACT EXTRACTION W/PHACO: SHX586

## 2023-02-07 SURGERY — PHACOEMULSIFICATION, CATARACT, WITH IOL INSERTION
Anesthesia: Monitor Anesthesia Care | Site: Eye | Laterality: Left

## 2023-02-07 MED ORDER — FENTANYL CITRATE (PF) 100 MCG/2ML IJ SOLN
INTRAMUSCULAR | Status: AC
  Filled 2023-02-07: qty 2

## 2023-02-07 MED ORDER — ARMC OPHTHALMIC DILATING DROPS
1.0000 | OPHTHALMIC | Status: DC | PRN
Start: 2023-02-07 — End: 2023-02-07
  Administered 2023-02-07 (×3): 1 via OPHTHALMIC

## 2023-02-07 MED ORDER — MIDAZOLAM HCL 2 MG/2ML IJ SOLN
INTRAMUSCULAR | Status: DC | PRN
  Administered 2023-02-07: 2 mg via INTRAVENOUS

## 2023-02-07 MED ORDER — SIGHTPATH DOSE#1 BSS IO SOLN
INTRAOCULAR | Status: DC | PRN
  Administered 2023-02-07: 54 mL via OPHTHALMIC

## 2023-02-07 MED ORDER — TETRACAINE HCL 0.5 % OP SOLN
1.0000 [drp] | OPHTHALMIC | Status: DC | PRN
Start: 2023-02-07 — End: 2023-02-07
  Administered 2023-02-07 (×3): 1 [drp] via OPHTHALMIC

## 2023-02-07 MED ORDER — FENTANYL CITRATE (PF) 100 MCG/2ML IJ SOLN
INTRAMUSCULAR | Status: DC | PRN
  Administered 2023-02-07 (×2): 50 ug via INTRAVENOUS

## 2023-02-07 MED ORDER — SIGHTPATH DOSE#1 NA CHONDROIT SULF-NA HYALURON 40-17 MG/ML IO SOLN
INTRAOCULAR | Status: DC | PRN
  Administered 2023-02-07: 1 mL via INTRAOCULAR

## 2023-02-07 MED ORDER — SIGHTPATH DOSE#1 BSS IO SOLN
INTRAOCULAR | Status: DC | PRN
  Administered 2023-02-07: 15 mL via INTRAOCULAR

## 2023-02-07 MED ORDER — TETRACAINE HCL 0.5 % OP SOLN
OPHTHALMIC | Status: AC
  Filled 2023-02-07: qty 4

## 2023-02-07 MED ORDER — BRIMONIDINE TARTRATE-TIMOLOL 0.2-0.5 % OP SOLN
OPHTHALMIC | Status: DC | PRN
  Administered 2023-02-07: 1 [drp] via OPHTHALMIC

## 2023-02-07 MED ORDER — ARMC OPHTHALMIC DILATING DROPS
OPHTHALMIC | Status: AC
  Filled 2023-02-07: qty 0.5

## 2023-02-07 MED ORDER — SIGHTPATH DOSE#1 BSS IO SOLN
INTRAOCULAR | Status: DC | PRN
  Administered 2023-02-07: 2 mL

## 2023-02-07 MED ORDER — FLUMAZENIL 0.5 MG/5ML IV SOLN
0.2000 mg | Freq: Once | INTRAVENOUS | Status: AC
  Administered 2023-02-07: 0.2 mg via INTRAVENOUS

## 2023-02-07 MED ORDER — MIDAZOLAM HCL 2 MG/2ML IJ SOLN
INTRAMUSCULAR | Status: AC
  Filled 2023-02-07: qty 2

## 2023-02-07 MED ORDER — MOXIFLOXACIN HCL 0.5 % OP SOLN
OPHTHALMIC | Status: DC | PRN
  Administered 2023-02-07: .2 mL via OPHTHALMIC

## 2023-02-07 MED ORDER — FLUMAZENIL 0.5 MG/5ML IV SOLN
INTRAVENOUS | Status: AC
Start: 2023-02-07 — End: ?
  Filled 2023-02-07: qty 5

## 2023-02-07 SURGICAL SUPPLY — 12 items
ANGLE REVERSE CUT SHRT 25GA (CUTTER) ×1
CANNULA ANT/CHMB 27G (MISCELLANEOUS) IMPLANT
CANNULA ANT/CHMB 27GA (MISCELLANEOUS)
CATARACT SUITE SIGHTPATH (MISCELLANEOUS) ×1
CYSTOTOME ANGL RVRS SHRT 25G (CUTTER) ×1 IMPLANT
FEE CATARACT SUITE SIGHTPATH (MISCELLANEOUS) ×1 IMPLANT
GLOVE BIOGEL PI IND STRL 8 (GLOVE) ×1 IMPLANT
GLOVE SURG LX STRL 8.0 MICRO (GLOVE) ×1 IMPLANT
LENS IOL TECNIS EYHANCE 21.0 (Intraocular Lens) IMPLANT
NDL FILTER BLUNT 18X1 1/2 (NEEDLE) ×1 IMPLANT
NEEDLE FILTER BLUNT 18X1 1/2 (NEEDLE) ×1
SYR 3ML LL SCALE MARK (SYRINGE) ×1 IMPLANT

## 2023-02-07 NOTE — Anesthesia Preprocedure Evaluation (Signed)
Anesthesia Evaluation  Patient identified by MRN, date of birth, ID band Patient awake    Reviewed: Allergy & Precautions, H&P , NPO status , Patient's Chart, lab work & pertinent test results  Airway Mallampati: III  TM Distance: >3 FB Neck ROM: Full    Dental no notable dental hx.  Permanent bridge, intact :   Pulmonary shortness of breath, sleep apnea    Pulmonary exam normal breath sounds clear to auscultation       Cardiovascular hypertension, Normal cardiovascular exam+ dysrhythmias + pacemaker  Rhythm:Regular Rate:Normal  11-19-20 1. Left ventricular ejection fraction, by estimation, is 55 to 60%. The  left ventricle has normal function. The left ventricle has no regional  wall motion abnormalities. Left ventricular diastolic parameters were  normal.   2. Right ventricular systolic function is normal. The right ventricular  size is normal.   3. The mitral valve is normal in structure. Trivial mitral valve  regurgitation.   4. The aortic valve is tricuspid. Aortic valve regurgitation is mild.   11-21-22 pacemaker interrogated   Neuro/Psych  Headaches PSYCHIATRIC DISORDERS Anxiety Depression   Dementia  negative psych ROS   GI/Hepatic Neg liver ROS,GERD  ,,  Endo/Other  negative endocrine ROS    Renal/GU negative Renal ROS  negative genitourinary   Musculoskeletal  (+) Arthritis ,    Abdominal   Peds negative pediatric ROS (+)  Hematology negative hematology ROS (+)   Anesthesia Other Findings Parkinson's disease  PTSD (post-traumatic stress disorder) Major depressive disorder Unspecified dementia, moderate, with psychotic disturbance Anxiety disorder Bradycardia  Tachycardia-bradycardia syndrome Hypertension Sleep apnea  Varicose veins Cancer (HCC)  Arthritis History of chicken pox  GERD (gastroesophageal reflux disease) Dysrhythmia  Depression Headache(784.0)  Shortness of breath  dyspnea History of kidney stones  Pacemaker Deep brain stimulator---DBS needs to be turned off for any surgery which requires monopolar--should avoid bipolar w/DBS   Reproductive/Obstetrics negative OB ROS                             Anesthesia Physical Anesthesia Plan  ASA: 3  Anesthesia Plan: MAC   Post-op Pain Management:    Induction: Intravenous  PONV Risk Score and Plan:   Airway Management Planned: Natural Airway and Nasal Cannula  Additional Equipment:   Intra-op Plan:   Post-operative Plan:   Informed Consent: I have reviewed the patients History and Physical, chart, labs and discussed the procedure including the risks, benefits and alternatives for the proposed anesthesia with the patient or authorized representative who has indicated his/her understanding and acceptance.     Dental Advisory Given  Plan Discussed with: Anesthesiologist, CRNA and Surgeon  Anesthesia Plan Comments: (Patient consented for risks of anesthesia including but not limited to:  - adverse reactions to medications - damage to eyes, teeth, lips or other oral mucosa - nerve damage due to positioning  - sore throat or hoarseness - Damage to heart, brain, nerves, lungs, other parts of body or loss of life  Patient voiced understanding.)        Anesthesia Quick Evaluation

## 2023-02-07 NOTE — H&P (Signed)
[facility]   Primary Care Physician:  Joaquim Nam, MD Ophthalmologist: Dr. Druscilla Brownie  Pre-Procedure History & Physical: HPI:  NAITIK ASTACIO is a 72 y.o. male here for cataract surgery.   Past Medical History:  Diagnosis Date   Anxiety disorder    Arthritis    Bradycardia    Cancer (HCC) 2013   skin cancer   Depression    ptsd   Dysrhythmia    chronic slow heart rate   GERD (gastroesophageal reflux disease)    H/O tachycardia-bradycardia syndrome    Headache(784.0)    tension headaches non recent   History of chicken pox    History of kidney stones    passed   Hypertension    treated with HCTZ   Major depressive disorder    Mild aortic regurgitation    Pacemaker    Parkinson's disease (HCC)    dx'ed 15 years ago   PTSD (post-traumatic stress disorder)    S/P deep brain stimulator placement    Shortness of breath dyspnea    Sleep apnea    doesn't use C-pap   Unspecified dementia, moderate, with psychotic disturbance (HCC)    Varicose veins     Past Surgical History:  Procedure Laterality Date   CATARACT EXTRACTION W/PHACO Right 01/17/2023   Procedure: CATARACT EXTRACTION PHACO AND INTRAOCULAR LENS PLACEMENT (IOC) RIGHT 6.33 00:42.6;  Surgeon: Galen Manila, MD;  Location: [facility];  Service: Ophthalmology;  Laterality: Right;   CHOLECYSTECTOMY N/A 10/22/2014   Procedure: LAPAROSCOPIC CHOLECYSTECTOMY WITH INTRAOPERATIVE CHOLANGIOGRAM;  Surgeon: Tiney Rouge III, MD;  Location: [facility];  Service: General;  Laterality: N/A;   cyst removed      from lip as a child   INTRAMEDULLARY (IM) NAIL INTERTROCHANTERIC N/A 02/18/2019   Procedure: INTRAMEDULLARY (IM) NAIL INTERTROCHANTRIC, RIGHT,;  Surgeon: Juanell Fairly, MD;  Location: [facility];  Service: Orthopedics;  Laterality: N/A;   LUMBAR LAMINECTOMY/DECOMPRESSION MICRODISCECTOMY Bilateral 12/14/2012   Procedure: Bilateral lumbar three-four, four-five decompressive laminotomy/foraminotomy;  Surgeon:  Temple Pacini, MD;  Location: [facility];  Service: Neurosurgery;  Laterality: Bilateral;   PULSE GENERATOR IMPLANT Bilateral 12/13/2013   Procedure: Bilateral implantable pulse generator placement;  Surgeon: Maeola Harman, MD;  Location: [facility];  Service: Neurosurgery;  Laterality: Bilateral;  Bilateral implantable pulse generator placement   skin cancer removed     from ears,   12 lft arm  rt leg 15   SUBTHALAMIC STIMULATOR BATTERY REPLACEMENT Bilateral 07/14/2017   Procedure: BILATERAL IMPLANTED PULSE GENERATOR CHANGE FOR DEEP BRAIN STIMULATOR;  Surgeon: Maeola Harman, MD;  Location: [facility];  Service: Neurosurgery;  Laterality: Bilateral;   SUBTHALAMIC STIMULATOR INSERTION Bilateral 12/06/2013   Procedure: SUBTHALAMIC STIMULATOR INSERTION;  Surgeon: Maeola Harman, MD;  Location: [facility];  Service: Neurosurgery;  Laterality: Bilateral;  Bilateral deep brain stimulator placement    Prior to Admission medications   Medication Sig Start Date End Date Taking? Authorizing Provider  acetaminophen (TYLENOL) 325 MG tablet Take 650 mg by mouth every 6 (six) hours as needed.   Yes [provider]  ARIPiprazole (ABILIFY) 2 MG tablet Take 2 mg by mouth daily. Taking 1/2 by mouth per [facility]   Yes [provider]  aspirin EC 81 MG tablet Take 1 tablet (81 mg total) by mouth daily. Swallow whole. 11/18/19  Yes Alver Sorrow, NP  Carbidopa-Levodopa ER (RYTARY) 48.75-195 MG CPCR Take 2 capsules by mouth 3 (three) times daily. 08/12/21  Yes Joaquim Nam,  MD  Cenobamate 25 MG TABS Take by mouth daily.   Yes [provider]  cholecalciferol (VITAMIN D) 1000 UNITS tablet Take 1,000 Units by mouth daily.   Yes [provider]  Cranberry 200 MG CAPS Take by mouth.   Yes [provider]  cycloSPORINE (RESTASIS) 0.05 % ophthalmic emulsion SMARTSIG:In Eye(s) 01/08/22  Yes [provider]  diclofenac Sodium (VOLTAREN) 1 % GEL Apply topically 4 (four)  times daily.   Yes [provider]  Melatonin 10 MG TABS Take 10 mg by mouth at bedtime.   Yes [provider]  memantine (NAMENDA) 10 MG tablet TAKE 1 TABLET TWICE A DAY 01/26/22  Yes Joaquim Nam, MD  Nystatin POWD Use twice a day if needed. 10/07/21  Yes Joaquim Nam, MD  omeprazole (PRILOSEC) 20 MG capsule TAKE 1 CAPSULE DAILY 06/10/21  Yes Joaquim Nam, MD  rivastigmine (EXELON) 9.5 mg/24hr 9.5 mg daily. 09/22/19  Yes [provider]  escitalopram (LEXAPRO) 20 MG tablet TAKE 1 TABLET DAILY Patient taking differently: Take 5 mg by mouth daily. 01/21/22   Joaquim Nam, MD  fluticasone Aleda Grana) 50 MCG/ACT nasal spray Place 2 sprays into both nostrils daily. Patient not taking: Reported on 01/11/2023 10/07/21   Joaquim Nam, MD    Allergies as of 12/13/2022 - Review Complete 06/29/2022  Allergen Reaction Noted   Clonazepam Other (See Comments) and Photosensitivity 11/30/2014   Morphine and codeine Other (See Comments) 11/28/2014   Nuplazid [pimavanserin tartrate] Other (See Comments) 10/07/2021   Pimavanserin Other (See Comments) 10/07/2021   Prednisone Other (See Comments) 09/02/2016   Tramadol  06/15/2020    Family History  Problem Relation Age of Onset   Lung cancer Father    Colon cancer Neg Hx    Prostate cancer Neg Hx     Social History   Socioeconomic History   Marital status: Married    Spouse name: Not on file   Number of children: Not on file   Years of education: Not on file   Highest education level: Not on file  Occupational History   Occupation: disabled    Comment: 2001, [employer]    Comment: search and rescue helicopter  Tobacco Use   Smoking status: Never   Smokeless tobacco: Former    Quit date: 2015  Vaping Use   Vaping status: Never Used  Substance and Sexual Activity   Alcohol use: Yes    Alcohol/week: 0.0 standard drinks of alcohol    Comment: occasional (twice per month)   Drug use: No   Sexual activity: Not  Currently  Other Topics Concern   Not on file  Social History Narrative   Previous [employer], 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  Prev search and rescue work   3 kids from prev relationship.    Married 2014   Social Determinants of Health   Financial Resource Strain: Low Risk  (05/05/2022)   Overall Financial Resource Strain (CARDIA)    Difficulty of Paying Living Expenses: Not hard at all  Food Insecurity: No Food Insecurity (05/05/2022)   Hunger Vital Sign    Worried About Running Out of Food in the Last Year: Never true    Ran Out of Food in the Last Year: Never true  Transportation Needs: No Transportation Needs (05/05/2022)   PRAPARE - Administrator, Civil Service (Medical): No    Lack of Transportation (Non-Medical): No  Physical Activity: Sufficiently Active (05/05/2022)  Exercise Vital Sign    Days of Exercise per Week: 4 days    Minutes of Exercise per Session: 50 min  Stress: No Stress Concern Present (05/05/2022)   [employer] of Occupational Health - Occupational Stress Questionnaire    Feeling of Stress : Not at all  Social Connections: Moderately Integrated (05/05/2022)   Social Connection and Isolation Panel [NHANES]    Frequency of Communication with Friends and Family: Twice a week    Frequency of Social Gatherings with Friends and Family: Twice a week    Attends Religious Services: More than 4 times per year    Active Member of [employer] or Organizations: No    Attends Banker Meetings: Never    Marital Status: Married  Catering manager Violence: Not At Risk (05/05/2022)   Humiliation, Afraid, Rape, and Kick questionnaire    Fear of Current or Ex-Partner: No    Emotionally Abused: No    Physically Abused: No    Sexually Abused: No    Review of Systems: See HPI, otherwise negative ROS  Physical Exam: BP (!) 159/100   Pulse 93   Temp (!) 97.5 F (36.4 C) (Temporal)   Resp 18   Ht 5\' 9"  (1.753 m)   Wt 81.6 kg   SpO2 100%    BMI 26.58 kg/m  General:   Alert, cooperative in NAD Head:  Normocephalic and atraumatic. Respiratory:  Normal work of breathing. Cardiovascular:  RRR  Impression/Plan: Montez Morita Majeed is here for cataract surgery.  Risks, benefits, limitations, and alternatives regarding cataract surgery have been reviewed with the patient.  Questions have been answered.  All parties agreeable.   Galen Manila, MD  02/07/2023, 1:14 PM

## 2023-02-07 NOTE — Transfer of Care (Signed)
Immediate Anesthesia Transfer of Care Note  Patient: [name]  Procedure(s) Performed: CATARACT EXTRACTION PHACO AND INTRAOCULAR LENS PLACEMENT (IOC) LEFT 6.35 00:38.3 (Left: Eye)  Patient Location: PACU  Anesthesia Type: MAC  Level of Consciousness: awake, alert  and patient cooperative  Airway and Oxygen Therapy: Patient Spontanous Breathing and Patient connected to supplemental oxygen  Post-op Assessment: Post-op Vital signs reviewed, Patient's Cardiovascular Status Stable, Respiratory Function Stable, Patent Airway and No signs of Nausea or vomiting  Post-op Vital Signs: Reviewed and stable  Complications: No notable events documented.

## 2023-02-07 NOTE — Op Note (Signed)
PREOPERATIVE DIAGNOSIS:  Nuclear sclerotic cataract of the left eye.   POSTOPERATIVE DIAGNOSIS:  Nuclear sclerotic cataract of the left eye.   OPERATIVE PROCEDURE:ORPROCALL@   SURGEON:  Galen Manila, MD.   ANESTHESIA:  Anesthesiologist: Stephanie Coup, MD CRNA: Emeterio Reeve, CRNA  1.      Managed anesthesia care. 2.     0.65ml of Shugarcaine was instilled following the paracentesis   COMPLICATIONS:  None.   TECHNIQUE:   Stop and chop   DESCRIPTION OF PROCEDURE:  The patient was examined and consented in the preoperative holding area where the aforementioned topical anesthesia was applied to the left eye and then brought back to the Operating Room where the left eye was prepped and draped in the usual sterile ophthalmic fashion and a lid speculum was placed. A paracentesis was created with the side port blade and the anterior chamber was filled with viscoelastic. A near clear corneal incision was performed with the steel keratome. A continuous curvilinear capsulorrhexis was performed with a cystotome followed by the capsulorrhexis forceps. Hydrodissection and hydrodelineation were carried out with BSS on a blunt cannula. The lens was removed in a stop and chop  technique and the remaining cortical material was removed with the irrigation-aspiration handpiece. The capsular bag was inflated with viscoelastic and the Technis [id number] lens was placed in the capsular bag without complication. The remaining viscoelastic was removed from the eye with the irrigation-aspiration handpiece. The wounds were hydrated. The anterior chamber was flushed with BSS and the eye was inflated to physiologic pressure. 0.42ml Vigamox was placed in the anterior chamber. The wounds were found to be water tight. The eye was dressed with Combigan. The patient was given protective glasses to wear throughout the day and a shield with which to sleep tonight. The patient was also given drops with which to begin a drop  regimen today and will follow-up with me in one day. Implant Name Type Inv. Item Serial No. Manufacturer Lot No. LRB No. Used Action  LENS IOL TECNIS EYHANCE 21.0 - [id number] Intraocular Lens LENS IOL TECNIS EYHANCE 21.0 [id number] [employer]  Left 1 Implanted    Procedure(s): CATARACT EXTRACTION PHACO AND INTRAOCULAR LENS PLACEMENT (IOC) LEFT 6.35 00:38.3 (Left)  Electronically signed: Galen Manila 02/07/2023 1:36 PM

## 2023-02-08 ENCOUNTER — Ambulatory Visit: Payer: Medicare PPO | Admitting: Physical Therapy

## 2023-02-08 ENCOUNTER — Encounter: Payer: Self-pay | Admitting: Ophthalmology

## 2023-02-08 NOTE — Anesthesia Postprocedure Evaluation (Signed)
Anesthesia Post Note  Patient: Montez Morita Meulemans  Procedure(s) Performed: CATARACT EXTRACTION PHACO AND INTRAOCULAR LENS PLACEMENT (IOC) LEFT 6.35 00:38.3 (Left: Eye)  Patient location during evaluation: PACU Anesthesia Type: MAC Level of consciousness: awake and alert Pain management: pain level controlled Vital Signs Assessment: post-procedure vital signs reviewed and stable Respiratory status: spontaneous breathing, nonlabored ventilation, respiratory function stable and patient connected to nasal cannula oxygen Cardiovascular status: stable and blood pressure returned to baseline Postop Assessment: no apparent nausea or vomiting Anesthetic complications: no  No notable events documented.   Last Vitals:  Vitals:   02/07/23 1400 02/07/23 1405  BP: (!) 151/74   Pulse: (!) 59 (!) 59  Resp: 12 10  Temp: (!) 36.2 C   SpO2: 95% 94%    Last Pain:  Vitals:   02/07/23 1400  TempSrc:   PainSc: 0-No pain                 Stephanie Coup

## 2023-02-20 ENCOUNTER — Ambulatory Visit: Payer: Self-pay

## 2023-02-20 DIAGNOSIS — G20A1 Parkinson's disease without dyskinesia, without mention of fluctuations: Secondary | ICD-10-CM | POA: Diagnosis not present

## 2023-02-20 DIAGNOSIS — Z9689 Presence of other specified functional implants: Secondary | ICD-10-CM | POA: Diagnosis not present

## 2023-02-20 DIAGNOSIS — F0280 Dementia in other diseases classified elsewhere without behavioral disturbance: Secondary | ICD-10-CM | POA: Diagnosis not present

## 2023-02-20 DIAGNOSIS — F0390 Unspecified dementia without behavioral disturbance: Secondary | ICD-10-CM | POA: Diagnosis not present

## 2023-02-20 DIAGNOSIS — G20A2 Parkinson's disease without dyskinesia, with fluctuations: Secondary | ICD-10-CM | POA: Diagnosis not present

## 2023-02-21 ENCOUNTER — Ambulatory Visit (INDEPENDENT_AMBULATORY_CARE_PROVIDER_SITE_OTHER): Payer: Medicare PPO | Admitting: Psychology

## 2023-02-21 DIAGNOSIS — F331 Major depressive disorder, recurrent, moderate: Secondary | ICD-10-CM

## 2023-02-21 DIAGNOSIS — F4323 Adjustment disorder with mixed anxiety and depressed mood: Secondary | ICD-10-CM

## 2023-02-21 NOTE — Progress Notes (Signed)
[facility] Counselor/Therapist Progress Note  Patient ID: [name], MRN: [id number],    Date: 02/21/2023  Time Spent: 50 minutes  Time in:  4:00  Time out:  4:50  Treatment Type: Indivdual  Reported Symptoms: sadness, and boredom  Mental Status Exam: Appearance:  Casual     Behavior: normal  Motor: Psychomotor Retardation  Speech/Language:  Slow  Affect: flat  Mood: pleasant  Thought process: concrete  Thought content:   WNL  Sensory/Perceptual disturbances:   WNL  Orientation: oriented to person, place, time/date, and situation  Attention: Fair  Concentration: Fair  Memory: Immediate;   Fair  Progress Energy of knowledge:  Fair  Insight:   Fair  Judgment:  Fair  Impulse Control: Fair   Risk Assessment: Danger to Self:  No Self-injurious Behavior: No Danger to Others: No Duty to Warn:no Physical Aggression / Violence:No  Access to Firearms a concern: No  Gang Involvement:No   Subjective: The patient attended a face-to-face individual therapy session  via video visit today.  The patient gave verbal consent for the video to be on Caregility and his wife is aware of limitations of telehealth.  The patient was in his home  and therapist was in the office.  The patient reports that his wife has COVID.  He presents as a little depressed and confused today.  We talked about the things that are going on in his house and he reports that they have someone living in the house with them who helps around the house.  The patient had moments where he seemed to have trouble grasping the thoughts he was trying to make.  We did some supportive therapy and I worked with him on some cognitive skills.  Encouraged the patient to take care of himself and hopefully he will not get COVID from his wife.  Interventions: Ego-Supportive, CBT, Social skills training, problem solving  Diagnosis:Major depressive disorder, recurrent episode, moderate (HCC)  Adjustment disorder with mixed  anxiety and depressed mood  Plan: Client Abilities/Strengths  Pt is bright and has a good sense of humor.  Client Treatment Preferences  Individual therapy.  Client Statement of Needs  improve coping skills regarding chronic illness.  Treatment Level  Outpatient Individual therapy  Symptoms  Depressed or irritable mood.: No Description Entered (Status: improved). Diminished interest in or  enjoyment of activities.: No Description Entered (Status: maintained). Feelings of hopelessness,  worthlessness, or inappropriate guilt.: No Description Entered (Status: improved). Lack of energy.: No  Description Entered (Status: maintained). Psychomotor agitation or retardation.: No Description  Entered (Status: maintained). Social withdrawal.: No Description Entered (Status: improved).  Problems Addressed  Unipolar Depression, Unipolar Depression, Unipolar Depression, Unipolar Depression  Goals 1. Alleviate depressive symptoms and return to previous level of effective  functioning. 2. Develop healthy interpersonal relationships that lead to the alleviation  and help prevent the relapse of depression. Objective Describe current and past experiences with depression including their impact on functioning and  attempts to resolve it. Target Date: 2023-05-01 Frequency: Monthly Progress: 60 Modality: individual Related Interventions 1. Encourage the client to share his/her thoughts and feelings of depression; express empathy and  build rapport while identifying primary cognitive, behavioral, interpersonal, or other  contributors to depression. Objective Learn and implement behavioral strategies to overcome depression. Target Date: 2023-05-01 Frequency: Monthly Progress: 80 Modality: individual Objective Identify and replace thoughts and beliefs that support depression. Target Date: 2023-05-01 Frequency: Monthly Progress: 70 Modality: individual Related Interventions 1. Conduct  Cognitive-Behavioral Therapy (see Cognitive  Behavior Therapy by Reola Calkins; Overcoming Depression by Agapito Games al.), beginning with helping the client learn the connection among  cognition, depressive feelings, and actions. 2. Explore and restructure underlying assumptions and beliefs reflected in biased self-talk that  may put the client at risk for relapse or recurrence. 3. Facilitate and reinforce the client's shift from biased depressive self-talk and beliefs to realitybased cognitive messages that enhance self-confidence and increase adaptive actions (see  "Positive Self-Talk" in the Adult Psychotherapy Homework Planner by Stephannie Li). Objective Learn and implement problem-solving and decision-making skills. Target Date: 2023-05-01 Frequency: Monthly Progress: 70 Modality: individual Related Interventions 1. Encourage in the client the development of a positive problem orientation in which problems  and solving them are viewed as a natural part of life and not something to be feared, despaired,  or avoided. 2. Conduct Problem-Solving Therapy (see Problem-Solving Therapy by Domenick Bookbinder and Rob Hickman) using techniques such as psychoeducation, modeling, and role-playing to teach client problem-solving skills (i.e., defining a problem specifically, generating possible solutions, evaluating the pros  and cons of each solution, selecting and implementing a plan of action, evaluating the efficacy  of the plan, accepting or revising the plan); role-play application of the problem-solving skill to  a real life issue (or assign "Applying Problem-Solving to Interpersonal Conflict" in the Adult  Psychotherapy Homework Planner by Stephannie Li). 3. Develop healthy thinking patterns and beliefs about self, others, and the world that lead to the alleviation and help prevent the relapse of  depression. 4. Recognize, accept, and cope with feelings of depression. Diagnosis Axis  none F43.21 (Adjustment Disorder, With  depressed mood) - Open -  [Signifier: n/a]  Adjustment Disorder,  With Depressed Mood  Axis  none 296.32 (Major depressive affective disorder, recurrent episode,  moderate) - Open - [Signifier: n/a]  Conditions For Discharge Achievement of treatment goals and objectives   Plan is to continue to offer supportive therapy to patient to help him deal with his depression related to his Parkinsons diagnosis.  Patient approved Treatment Plan   Lucan Riner Harrel Lemon, LCSW

## 2023-02-21 NOTE — Addendum Note (Signed)
Addended by: Mora Bellman on: 02/21/2023 06:00 PM   Modules accepted: Level of Service

## 2023-03-14 ENCOUNTER — Ambulatory Visit (INDEPENDENT_AMBULATORY_CARE_PROVIDER_SITE_OTHER): Payer: Medicare PPO | Admitting: Psychology

## 2023-03-14 DIAGNOSIS — F4323 Adjustment disorder with mixed anxiety and depressed mood: Secondary | ICD-10-CM | POA: Diagnosis not present

## 2023-03-14 DIAGNOSIS — F331 Major depressive disorder, recurrent, moderate: Secondary | ICD-10-CM

## 2023-03-14 NOTE — Progress Notes (Unsigned)
                [name] [name], LCSW 

## 2023-03-29 ENCOUNTER — Telehealth: Payer: Self-pay | Admitting: Family Medicine

## 2023-03-29 NOTE — Telephone Encounter (Signed)
Noted. Thanks.

## 2023-03-29 NOTE — Telephone Encounter (Signed)
Contacted [name] to schedule their annual wellness visit. Patient declined to schedule AWV at this time. Transferred care to the [facility] clinic. Patient's wife didn't provide doctors name.   [facility] Care Guide [facility] AWV TEAM Direct Dial: [phone]

## 2023-04-04 ENCOUNTER — Ambulatory Visit: Payer: Medicare PPO | Admitting: Psychology

## 2023-04-04 DIAGNOSIS — F4323 Adjustment disorder with mixed anxiety and depressed mood: Secondary | ICD-10-CM

## 2023-04-04 DIAGNOSIS — F331 Major depressive disorder, recurrent, moderate: Secondary | ICD-10-CM | POA: Diagnosis not present

## 2023-04-04 NOTE — Progress Notes (Unsigned)
                Saajan Willmon G Casmira Cramer, LCSW

## 2023-04-25 ENCOUNTER — Ambulatory Visit: Payer: Medicare PPO | Admitting: Psychology

## 2023-04-25 DIAGNOSIS — F331 Major depressive disorder, recurrent, moderate: Secondary | ICD-10-CM

## 2023-04-25 NOTE — Progress Notes (Signed)
 [facility] Counselor/Therapist Progress Note  Patient ID: [name], MRN: [id number],    Date: 04/25/2023  Time Spent: 45 minutes  Time in:  4:03  Time out:  4:48  Treatment Type: Indivdual  Reported Symptoms: sadness, and boredom  Mental Status Exam: Appearance:  Casual     Behavior: normal  Motor: Psychomotor Retardation  Speech/Language:  Slow  Affect: flat  Mood: pleasant  Thought process: concrete  Thought content:   WNL  Sensory/Perceptual disturbances:   WNL  Orientation: oriented to person, place, time/date, and situation  Attention: Fair  Concentration: Fair  Memory: Immediate;   Fair  Progress Energy of knowledge:  Fair  Insight:   Fair  Judgment:  Fair  Impulse Control: Fair   Risk Assessment: Danger to Self:  No Self-injurious Behavior: No Danger to Others: No Duty to Warn:no Physical Aggression / Violence:No  Access to Firearms a concern: No  Gang Involvement:No   Subjective: The patient attended a face-to-face individual therapy session  via video visit today.  The patient gave verbal consent for the video to be on Caregility and his wife is aware of limitations of telehealth.  The patient was in his home  and therapist was in the office.  The patient presents with a flat affect and mood is pleasant.  When we got on the session today the patient had a caregiver in the room with him and I noticed that she was sitting over to the left of the screen and recommended that she leave the room.  The patient also was struggling with attending to the session because there was a lot of noise going on outside of the bedroom that he was in.  We lost connectivity at some point during the session and had to get back into the computer and I had to send a text to his wife to get him back into the computer.  I had a conversation with her while she was in the room and talked to her about my concern that there is too many distractions and that he struggles himself  cognitively at times to attend to the session.  I asked them to talk about it and decide whether they felt like it was helpful for him to continue therapy.  She did report that he does enjoy having the therapy time however there are times that it takes a lot of work to get him to engage and he struggles with putting thoughts together.  We decided to skip our next session and to talk about it again in 6 weeks as I am just not sure how much that therapy is helping him.  I do think it helps him to vent and have a place to talk however he does seem to struggle sometimes staying on topic.    Interventions: Ego-Supportive, CBT, Social skills training, problem solving  Diagnosis:Major depressive disorder, recurrent episode, moderate (HCC)  Plan: Client Abilities/Strengths  Pt is bright and has a good sense of humor.  Client Treatment Preferences  Individual therapy.  Client Statement of Needs  improve coping skills regarding chronic illness.  Treatment Level  Outpatient Individual therapy  Symptoms  Depressed or irritable mood.: No Description Entered (Status: improved). Diminished interest in or  enjoyment of activities.: No Description Entered (Status: maintained). Feelings of hopelessness,  worthlessness, or inappropriate guilt.: No Description Entered (Status: improved). Lack of energy.: No  Description Entered (Status: maintained). Psychomotor agitation or retardation.: No Description  Entered (Status: maintained). Social withdrawal.: No Description  Entered (Status: improved).  Problems Addressed  Unipolar Depression, Unipolar Depression, Unipolar Depression, Unipolar Depression  Goals 1. Alleviate depressive symptoms and return to previous level of effective  functioning. 2. Develop healthy interpersonal relationships that lead to the alleviation  and help prevent the relapse of depression. Objective Describe current and past experiences with depression including their impact on  functioning and  attempts to resolve it. Target Date: 2024-04-30 Frequency: Monthly Progress: 60 Modality: individual Related Interventions 1. Encourage the client to share his/her thoughts and feelings of depression; express empathy and  build rapport while identifying primary cognitive, behavioral, interpersonal, or other  contributors to depression. Objective Learn and implement behavioral strategies to overcome depression. Target Date: 2024-04-30 Frequency: Monthly Progress: 80 Modality: individual Objective Identify and replace thoughts and beliefs that support depression. Target Date: 2024-04-30 Frequency: Monthly Progress: 70 Modality: individual Related Interventions 1. Conduct Cognitive-Behavioral Therapy (see Cognitive Behavior Therapy by Almarie; Overcoming Depression by Marine dunker al.), beginning with helping the client learn the connection among  cognition, depressive feelings, and actions. 2. Explore and restructure underlying assumptions and beliefs reflected in biased self-talk that  may put the client at risk for relapse or recurrence. 3. Facilitate and reinforce the client's shift from biased depressive self-talk and beliefs to realitybased cognitive messages that enhance self-confidence and increase adaptive actions (see  Positive Self-Talk in the Adult Psychotherapy Homework Planner by Jenniffer). Objective Learn and implement problem-solving and decision-making skills. Target Date: 2024-04-30 Frequency: Monthly Progress: 70 Modality: individual Related Interventions 1. Encourage in the client the development of a positive problem orientation in which problems  and solving them are viewed as a natural part of life and not something to be feared, despaired,  or avoided. 2. Conduct Problem-Solving Therapy (see Problem-Solving Therapy by Francisco and Nezu) using techniques such as psychoeducation, modeling, and role-playing to teach client problem-solving skills  (i.e., defining a problem specifically, generating possible solutions, evaluating the pros  and cons of each solution, selecting and implementing a plan of action, evaluating the efficacy  of the plan, accepting or revising the plan); role-play application of the problem-solving skill to  a real life issue (or assign Applying Problem-Solving to Interpersonal Conflict in the Adult  Psychotherapy Homework Planner by Jenniffer). 3. Develop healthy thinking patterns and beliefs about self, others, and the world that lead to the alleviation and help prevent the relapse of  depression. 4. Recognize, accept, and cope with feelings of depression. Diagnosis Axis  none F43.21 (Adjustment Disorder, With depressed mood) - Open -  [Signifier: n/a]  Adjustment Disorder,  With Depressed Mood  Axis  none 296.32 (Major depressive affective disorder, recurrent episode,  moderate) - Open - [Signifier: n/a]  Conditions For Discharge Achievement of treatment goals and objectives   Plan is to continue to offer supportive therapy to patient to help him deal with his depression related to his Parkinsons diagnosis.  Patient approved Treatment Plan   Dhruv Christina KANDICE Macintosh, LCSW

## 2023-05-16 ENCOUNTER — Ambulatory Visit: Payer: Medicare PPO | Admitting: Psychology

## 2023-05-22 ENCOUNTER — Emergency Department
Admission: EM | Admit: 2023-05-22 | Discharge: 2023-05-22 | Disposition: A | Discharge status: Home / Self Care | Payer: Medicare PPO | Attending: Emergency Medicine | Admitting: Emergency Medicine

## 2023-05-22 ENCOUNTER — Other Ambulatory Visit: Payer: Self-pay

## 2023-05-22 ENCOUNTER — Emergency Department: Payer: Medicare PPO

## 2023-05-22 ENCOUNTER — Ambulatory Visit: Payer: Self-pay

## 2023-05-22 DIAGNOSIS — I700 Atherosclerosis of aorta: Secondary | ICD-10-CM | POA: Diagnosis not present

## 2023-05-22 DIAGNOSIS — S42035A Nondisplaced fracture of lateral end of left clavicle, initial encounter for closed fracture: Secondary | ICD-10-CM

## 2023-05-22 DIAGNOSIS — N200 Calculus of kidney: Secondary | ICD-10-CM | POA: Diagnosis not present

## 2023-05-22 DIAGNOSIS — Z1152 Encounter for screening for COVID-19: Secondary | ICD-10-CM | POA: Insufficient documentation

## 2023-05-22 DIAGNOSIS — S301XXA Contusion of abdominal wall, initial encounter: Secondary | ICD-10-CM | POA: Insufficient documentation

## 2023-05-22 DIAGNOSIS — W19XXXA Unspecified fall, initial encounter: Secondary | ICD-10-CM | POA: Insufficient documentation

## 2023-05-22 DIAGNOSIS — I2510 Atherosclerotic heart disease of native coronary artery without angina pectoris: Secondary | ICD-10-CM | POA: Insufficient documentation

## 2023-05-22 DIAGNOSIS — R404 Transient alteration of awareness: Secondary | ICD-10-CM | POA: Insufficient documentation

## 2023-05-22 DIAGNOSIS — M25512 Pain in left shoulder: Secondary | ICD-10-CM | POA: Diagnosis not present

## 2023-05-22 DIAGNOSIS — R464 Slowness and poor responsiveness: Secondary | ICD-10-CM | POA: Diagnosis present

## 2023-05-22 LAB — COMPREHENSIVE METABOLIC PANEL
ALT: 9 U/L (ref 0–44)
AST: 15 U/L (ref 15–41)
Albumin: 3.6 g/dL (ref 3.5–5.0)
Alkaline Phosphatase: 83 U/L (ref 38–126)
Anion gap: 9 (ref 5–15)
BUN: 25 mg/dL — ABNORMAL HIGH (ref 8–23)
CO2: 27 mmol/L (ref 22–32)
Calcium: 8.8 mg/dL — ABNORMAL LOW (ref 8.9–10.3)
Chloride: 105 mmol/L (ref 98–111)
Creatinine, Ser: 0.83 mg/dL (ref 0.61–1.24)
GFR, Estimated: >60 mL/min (ref >60)
Glucose, Bld: 127 mg/dL — ABNORMAL HIGH (ref 70–99)
Potassium: 3.7 mmol/L (ref 3.5–5.1)
Sodium: 141 mmol/L (ref 135–145)
Total Bilirubin: 0.4 mg/dL (ref 0.0–1.2)
Total Protein: 7 g/dL (ref 6.5–8.1)

## 2023-05-22 LAB — CBC
HCT: 38 % — ABNORMAL LOW (ref 39.0–52.0)
Hemoglobin: 12.6 g/dL — ABNORMAL LOW (ref 13.0–17.0)
MCH: 29.9 pg (ref 26.0–34.0)
MCHC: 33.2 g/dL (ref 30.0–36.0)
MCV: 90 fL (ref 80.0–100.0)
Platelets: 197 10*3/uL (ref 150–400)
RBC: 4.22 MIL/uL (ref 4.22–5.81)
RDW: 13.6 % (ref 11.5–15.5)
WBC: 6.7 10*3/uL (ref 4.0–10.5)
nRBC: 0 % (ref 0.0–0.2)

## 2023-05-22 LAB — URINALYSIS, W/ REFLEX TO CULTURE (INFECTION SUSPECTED)
Bacteria, UA: NONE SEEN
Bilirubin Urine: NEGATIVE
Glucose, UA: NEGATIVE mg/dL
Ketones, ur: NEGATIVE mg/dL
Leukocytes,Ua: NEGATIVE
Nitrite: NEGATIVE
Protein, ur: NEGATIVE mg/dL
Specific Gravity, Urine: 1.039 — ABNORMAL HIGH (ref 1.005–1.030)
Squamous Epithelial / HPF: 0 /[HPF] (ref 0–5)
pH: 6 (ref 5.0–8.0)

## 2023-05-22 LAB — RESP PANEL BY RT-PCR (RSV, FLU A&B, COVID)  RVPGX2
Influenza A by PCR: NEGATIVE
Influenza B by PCR: NEGATIVE
Resp Syncytial Virus by PCR: NEGATIVE
SARS Coronavirus 2 by RT PCR: NEGATIVE

## 2023-05-22 LAB — TROPONIN I (HIGH SENSITIVITY): Troponin I (High Sensitivity): 5 ng/L (ref <18)

## 2023-05-22 LAB — LIPASE, BLOOD: Lipase: 20 U/L (ref 11–51)

## 2023-05-22 MED ORDER — IOHEXOL 300 MG/ML  SOLN
100.0000 mL | Freq: Once | INTRAMUSCULAR | Status: AC | PRN
  Administered 2023-05-22: 100 mL via INTRAVENOUS

## 2023-05-22 MED ORDER — ACETAMINOPHEN 500 MG PO TABS
1000.0000 mg | ORAL_TABLET | Freq: Once | ORAL | Status: AC
  Administered 2023-05-22: 1000 mg via ORAL
  Filled 2023-05-22: qty 2

## 2023-05-22 NOTE — ED Provider Notes (Signed)
[facility] Provider Note    Event Date/Time   First MD Initiated Contact with Patient 05/22/23 [id number]     (approximate)   History   Altered Mental Status   HPI  [name] is a 73 y.o. male presents to the emergency department following an episode of unresponsiveness.  Wife called out because the patient was not responding to her.  When EMS arrived patient was nonresponsive.  Slowly responding.  Opening his eyes spontaneously.  Last known well was yesterday but uncertain of what time.  Does endorse a fall last night.  Uncertain if the patient hit his head.  Also uncertain if the patient takes any anticoagulation.  Patient denies any complaints at this time.  Able to state that it is 2025.  Approximately 10 minutes patient's wife and son arrived.  Patient now responding more.  They state that he is back to his normal.  Normal state of health yesterday but did go to sleep earlier than he normally does.  Difficulty to wake him up this morning.  States that by the time he arrived to the emergency department he is not back to his normal mental status.  Did have a fall last week.  Complaining of some mild pain to his left shoulder.     Physical Exam   Triage Vital Signs: ED Triage Vitals  Encounter Vitals Group     BP 05/22/23 0938 (!) 174/87     Systolic BP Percentile --      Diastolic BP Percentile --      Pulse Rate 05/22/23 0936 95     Resp 05/22/23 0936 13     Temp 05/22/23 0936 98.1 F (36.7 C)     Temp Source 05/22/23 0936 Oral     SpO2 05/22/23 0936 97 %     Weight 05/22/23 0938 183 lb 11.2 oz (83.3 kg)     Height 05/22/23 0938 5\' 9"  (1.753 m)     Head Circumference --      Peak Flow --      Pain Score 05/22/23 0937 6     Pain Loc --      Pain Education --      Exclude from Growth Chart --     Most recent vital signs: Vitals:   05/22/23 1100 05/22/23 1130  BP: (!) 164/85 (!) 174/82  Pulse: 61 (!) 59  Resp: 14 15  Temp:    SpO2: 99%  98%    Physical Exam Constitutional:      Appearance: He is well-developed.  HENT:     Head: Atraumatic.  Eyes:     Conjunctiva/sclera: Conjunctivae normal.  Cardiovascular:     Rate and Rhythm: Regular rhythm.  Pulmonary:     Effort: No respiratory distress.  Abdominal:     Tenderness: There is no abdominal tenderness (Ecchymosis to the left abdominal wall).  Musculoskeletal:        General: Normal range of motion.     Cervical back: Normal range of motion. No tenderness.     Right lower leg: No edema.     Left lower leg: No edema.     Comments: Mild tenderness to palpation with active range of motion of the left shoulder.  No underlying bony tenderness to palpation to bilateral upper or lower extremities  Skin:    General: Skin is warm.     Capillary Refill: Capillary refill takes less than 2 seconds.  Neurological:     Mental  Status: He is alert and oriented to person, place, and time. Mental status is at baseline.     GCS: GCS eye subscore is 4. GCS verbal subscore is 5. GCS motor subscore is 6.     Cranial Nerves: Cranial nerves 2-12 are intact.     Sensory: Sensation is intact.     Motor: Motor function is intact.  Psychiatric:        Mood and Affect: Mood normal.     IMPRESSION / MDM / ASSESSMENT AND PLAN / ED COURSE  I reviewed the triage vital signs and the nursing notes.  Differential diagnoses includes intracranial hemorrhage, traumatic injury, CVA, encephalopathy, electrolyte abnormality, urinary tract infection, viral illness including COVID/influenza, ACS  EKG  I, Corena Herter, the attending physician, personally viewed and interpreted this ECG.   Rate: Normal  Rhythm: Normal sinus  Axis: Normal  Intervals: Normal  ST&T Change: None  No tachycardic or bradycardic dysrhythmias while on cardiac telemetry.  RADIOLOGY I independently reviewed imaging, my interpretation of imaging: CT scan of the head with no signs of intracranial hemorrhage  CT scan  chest abdomen pelvis with contrast with no acute traumatic injury.  Nephrolithiasis and coronary artery disease noted incidentally.  LABS (all labs ordered are listed, but only abnormal results are displayed) Labs interpreted as -    Labs Reviewed  CBC - Abnormal; Notable for the following components:      Result Value   Hemoglobin 12.6 (*)    HCT 38.0 (*)    All other components within normal limits  COMPREHENSIVE METABOLIC PANEL - Abnormal; Notable for the following components:   Glucose, Bld 127 (*)    BUN 25 (*)    Calcium 8.8 (*)    All other components within normal limits  URINALYSIS, W/ REFLEX TO CULTURE (INFECTION SUSPECTED) - Abnormal; Notable for the following components:   Color, Urine YELLOW (*)    APPearance CLEAR (*)    Specific Gravity, Urine 1.039 (*)    Hgb urine dipstick SMALL (*)    All other components within normal limits  RESP PANEL BY RT-PCR (RSV, FLU A&B, COVID)  RVPGX2  LIPASE, BLOOD  CBG MONITORING, ED  TROPONIN I (HIGH SENSITIVITY)     MDM  Patient's lab work overall reassuring.  No significant leukocytosis or anemia.  Creatinine at baseline with no significant electrolyte abnormality.  Urine with no signs of urinary tract infection.  COVID and influenza testing are negative.  Troponin is negative.  Patient is back to his mental status baseline.  Has a nonfocal neurologic exam, clinical picture is not consistent with CVA given that his neurologic exam is normal.  No signs of intracranial hemorrhage or infarction on CT scan.  Discussed incidental finding with patient's family members at bedside.  Patient's blood pressure elevated in the emergency department, on chart review does have a history of hypertension, currently asymptomatic.  [facility] and return precautions for any ongoing or worsening symptoms.     PROCEDURES:  Critical Care performed: No  Procedures  Patient's  presentation is most consistent with acute presentation with potential threat to life or bodily function.   MEDICATIONS ORDERED IN ED: Medications  acetaminophen (TYLENOL) tablet 1,000 mg (1,000 mg Oral Given 05/22/23 1053)  iohexol (OMNIPAQUE) 300 MG/ML solution 100 mL (100 mLs Intravenous Contrast Given 05/22/23 1105)    FINAL CLINICAL IMPRESSION(S) / ED DIAGNOSES   Final diagnoses:  Transient alteration  of awareness     Rx / DC Orders   ED Discharge Orders     None        Note:  This document was prepared using Dragon voice recognition software and may include unintentional dictation errors.   Corena Herter, MD 05/22/23 1321

## 2023-05-22 NOTE — ED Triage Notes (Addendum)
Pt to ED via [facility] for unresponsiveness. Wife called out due to pt not responding to her. Pt able to follow commands at this time. Eye opening spontaneous. Pt oriented to self. Pt baseline is a&o x4. LKW is last night, EMS unable to give specific time. Ems reports fall last night. Unknown if pt hit his head. Per EMS wife states that this has happened before and pt had peritonitis. Pt only complains of back pain.  EMS vitals  BP 168/86 HR 60-103 RR 16  CO2 40 CBG 140 SPO2 96% RA

## 2023-05-22 NOTE — ED Notes (Signed)
PMS intact post placement sling

## 2023-05-22 NOTE — ED Notes (Signed)
Wife and son at bedside. Wife reports pt's LKW was 7:30pm last night.

## 2023-05-22 NOTE — Discharge Instructions (Signed)
You were seen in the emergency department following an episode of difficulty waking you up this morning with your family members.  By the time he arrived to the emergency department you are back to your normal mental status.  Your workup in the emergency department was overall unremarkable.  You did not have any findings of blood on the CT scan of your head.  No traumatic injuries noted on your CT scans.  Your lab work was normal.  You did not have a urinary tract infection.  Your COVID and influenza testing were negative.  You have an x-ray of your left shoulder that did not show any broken bones or injury.  Your heart enzyme was normal and have a low suspicion that you are having a heart attack today.  You had an incidental finding of coronary artery calcifications on your CT scan.  If you have any episodes of chest pain it is important you return to the emergency department immediately.  Follow-up with your primary care physician to discuss whether you may need outpatient stress testing.  Return for any ongoing or worsening symptoms.  Thank you for choosing [place] for your health care, it was my pleasure to care for you today!  Corena Herter, MD

## 2023-05-24 ENCOUNTER — Ambulatory Visit: Payer: Medicare PPO

## 2023-05-24 DIAGNOSIS — I495 Sick sinus syndrome: Secondary | ICD-10-CM

## 2023-05-29 NOTE — Therapy (Deleted)
 OUTPATIENT PHYSICAL THERAPY NEURO EVALUATION   Patient Name: [name] MRN: [id number] DOB:1950-09-18, 73 y.o., male Today's Date: 05/29/2023   PCP: Caffie Pinto, PA-C REFERRING PROVIDER: Suzy Bouchard, PA   END OF SESSION:   Past Medical History:  Diagnosis Date   Anxiety disorder    Arthritis    Bradycardia    Cancer (HCC) 2013   skin cancer   Depression    ptsd   Dysrhythmia    chronic slow heart rate   GERD (gastroesophageal reflux disease)    H/O tachycardia-bradycardia syndrome    Headache(784.0)    tension headaches non recent   History of chicken pox    History of kidney stones    passed   Hypertension    treated with HCTZ   Major depressive disorder    Mild aortic regurgitation    Pacemaker    Parkinson's disease (HCC)    dx'ed 15 years ago   PTSD (post-traumatic stress disorder)    S/P deep brain stimulator placement    Shortness of breath dyspnea    Sleep apnea    doesn't use C-pap   Unspecified dementia, moderate, with psychotic disturbance (HCC)    Varicose veins    Past Surgical History:  Procedure Laterality Date   CATARACT EXTRACTION W/PHACO Right 01/17/2023   Procedure: CATARACT EXTRACTION PHACO AND INTRAOCULAR LENS PLACEMENT (IOC) RIGHT 6.33 00:42.6;  Surgeon: Galen Manila, MD;  Location: [facility];  Service: Ophthalmology;  Laterality: Right;   CATARACT EXTRACTION W/PHACO Left 02/07/2023   Procedure: CATARACT EXTRACTION PHACO AND INTRAOCULAR LENS PLACEMENT (IOC) LEFT 6.35 00:38.3;  Surgeon: Galen Manila, MD;  Location: [facility];  Service: Ophthalmology;  Laterality: Left;   CHOLECYSTECTOMY N/A 10/22/2014   Procedure: LAPAROSCOPIC CHOLECYSTECTOMY WITH INTRAOPERATIVE CHOLANGIOGRAM;  Surgeon: Tiney Rouge III, MD;  Location: [facility];  Service: General;  Laterality: N/A;   cyst removed      from lip as a child   INTRAMEDULLARY (IM) NAIL INTERTROCHANTERIC N/A 02/18/2019   Procedure: INTRAMEDULLARY (IM)  NAIL INTERTROCHANTRIC, RIGHT,;  Surgeon: Juanell Fairly, MD;  Location: [facility];  Service: Orthopedics;  Laterality: N/A;   LUMBAR LAMINECTOMY/DECOMPRESSION MICRODISCECTOMY Bilateral 12/14/2012   Procedure: Bilateral lumbar three-four, four-five decompressive laminotomy/foraminotomy;  Surgeon: Temple Pacini, MD;  Location: [facility];  Service: Neurosurgery;  Laterality: Bilateral;   PULSE GENERATOR IMPLANT Bilateral 12/13/2013   Procedure: Bilateral implantable pulse generator placement;  Surgeon: Maeola Harman, MD;  Location: [facility];  Service: Neurosurgery;  Laterality: Bilateral;  Bilateral implantable pulse generator placement   skin cancer removed     from ears,   12 lft arm  rt leg 15   SUBTHALAMIC STIMULATOR BATTERY REPLACEMENT Bilateral 07/14/2017   Procedure: BILATERAL IMPLANTED PULSE GENERATOR CHANGE FOR DEEP BRAIN STIMULATOR;  Surgeon: Maeola Harman, MD;  Location: [facility];  Service: Neurosurgery;  Laterality: Bilateral;   SUBTHALAMIC STIMULATOR INSERTION Bilateral 12/06/2013   Procedure: SUBTHALAMIC STIMULATOR INSERTION;  Surgeon: Maeola Harman, MD;  Location: [facility];  Service: Neurosurgery;  Laterality: Bilateral;  Bilateral deep brain stimulator placement   Patient Active Problem List   Diagnosis Date Noted   Hematuria 10/10/2021   Sinus drainage 12/09/2020   Medicare annual wellness visit, subsequent 11/18/2020   Pacemaker    Dementia associated with Parkinson's disease (HCC) 06/17/2020   Vertigo 02/19/2020   Urine abnormality 02/19/2020   Dysphagia 02/23/2019   Fall at home 10/18/2017   REM behavioral disorder 11/02/2016   Radicular pain in right arm  10/09/2016   GERD (gastroesophageal reflux disease) 07/31/2016   Colon cancer screening 07/23/2015   Gout 02/18/2015   Depression 01/06/2015   Skin lesion 10/02/2014   Dupuytren's contracture 08/20/2014   Cough 07/21/2014   Lumbar stenosis with neurogenic claudication 06/13/2014   S/P deep brain stimulator  placement 05/08/2014   Advance care planning 01/19/2014   Parkinson's disease (HCC) 12/13/2013   PTSD (post-traumatic stress disorder) 06/13/2013   Erectile dysfunction 06/13/2013   HLD (hyperlipidemia) 06/13/2013   Essential hypertension 06/03/2013   Bradycardia by electrocardiogram 06/03/2013   Obstructive sleep apnea 03/12/2013    ONSET DATE: ***  REFERRING DIAG: G20.A1 (ICD-10-CM) - Parkinsons disease (HCC)   THERAPY DIAG:  No diagnosis found.  Rationale for Evaluation and Treatment: {HABREHAB:27488}  SUBJECTIVE:                                                                                                                                                                                             SUBJECTIVE STATEMENT: *** Pt accompanied by: {accompnied:27141}  PERTINENT HISTORY: Parkinson's, deep brain stim, depression, lipids, HTN, GERD, ED, dementia, seizures, hx of falls with resultant hip fx and clavicular fx, LBP/PSF, and OSA.   PAIN:  Are you having pain? {OPRCPAIN:27236}  PRECAUTIONS: {Therapy precautions:24002}  RED FLAGS: {PT Red Flags:29287}   WEIGHT BEARING RESTRICTIONS: {Yes ***/No:24003}  FALLS: Has patient fallen in last 6 months? {fallsyesno:27318} Most recent on Feb 3,2025 when pt went to ED due to AMS   LIVING ENVIRONMENT: Lives with: {OPRC lives with:25569::"lives with their family"} Lives in: {Lives in:25570} Stairs: {opstairs:27293} Has following equipment at home: {Assistive devices:23999} U-step walker  PLOF: {PLOF:24004}  PATIENT GOALS: ***  OBJECTIVE:  Note: Objective measures were completed at Evaluation unless otherwise noted.  DIAGNOSTIC FINDINGS: ***  COGNITION: Overall cognitive status: {cognition:24006}   SENSATION: {sensation:27233}  COORDINATION: ***  EDEMA:  {edema:24020}  MUSCLE TONE: {LE tone:25568}  MUSCLE LENGTH: Hamstrings: Right *** deg; Left *** deg Maisie Fus test: Right *** deg; Left *** deg  DTRs:   {DTR SITE:24025}  POSTURE: {posture:25561}  LOWER EXTREMITY ROM:     {AROM/PROM:27142}  Right Eval Left Eval  Hip flexion    Hip extension    Hip abduction    Hip adduction    Hip internal rotation    Hip external rotation    Knee flexion    Knee extension    Ankle dorsiflexion    Ankle plantarflexion    Ankle inversion    Ankle eversion     (Blank rows = not tested)  LOWER EXTREMITY MMT:    MMT Right Eval Left Eval  Hip flexion  Hip extension    Hip abduction    Hip adduction    Hip internal rotation    Hip external rotation    Knee flexion    Knee extension    Ankle dorsiflexion    Ankle plantarflexion    Ankle inversion    Ankle eversion    (Blank rows = not tested)  BED MOBILITY:  {Bed mobility:24027}  TRANSFERS: Assistive device utilized: {Assistive devices:23999}  Sit to stand: {Levels of assistance:24026} Stand to sit: {Levels of assistance:24026} Chair to chair: {Levels of assistance:24026} Floor: {Levels of assistance:24026}  RAMP:  Level of Assistance: {Levels of assistance:24026} Assistive device utilized: {Assistive devices:23999} Ramp Comments: ***  CURB:  Level of Assistance: {Levels of assistance:24026} Assistive device utilized: {Assistive devices:23999} Curb Comments: ***  STAIRS: Level of Assistance: {Levels of assistance:24026} Stair Negotiation Technique: {Stair Technique:27161} with {Rail Assistance:27162} Number of Stairs: ***  Height of Stairs: ***  Comments: ***  GAIT: Gait pattern: {gait characteristics:25376} Distance walked: *** Assistive device utilized: {Assistive devices:23999} Level of assistance: {Levels of assistance:24026} Comments: ***  FUNCTIONAL TESTS:  {Functional tests:24029}  PATIENT SURVEYS:  {rehab surveys:24030}                                                                                                                              TREATMENT DATE: ***    PATIENT  EDUCATION: Education details: *** Person educated: {Person educated:25204} Education method: {Education Method:25205} Education comprehension: {Education Comprehension:25206}  HOME EXERCISE PROGRAM: ***  GOALS: Goals reviewed with patient? {yes/no:20286}  SHORT TERM GOALS: Target date: ***  *** Baseline: Goal status: INITIAL  2.  *** Baseline:  Goal status: INITIAL  3.  *** Baseline:  Goal status: INITIAL  4.  *** Baseline:  Goal status: INITIAL  5.  *** Baseline:  Goal status: INITIAL  6.  *** Baseline:  Goal status: INITIAL  LONG TERM GOALS: Target date: ***  *** Baseline:  Goal status: INITIAL  2.  *** Baseline:  Goal status: INITIAL  3.  *** Baseline:  Goal status: INITIAL  4.  *** Baseline:  Goal status: INITIAL  5.  *** Baseline:  Goal status: INITIAL  6.  *** Baseline:  Goal status: INITIAL  ASSESSMENT:  CLINICAL IMPRESSION: Patient is a *** y.o. *** who was seen today for physical therapy evaluation and treatment for ***.   OBJECTIVE IMPAIRMENTS: {opptimpairments:25111}.   ACTIVITY LIMITATIONS: {activitylimitations:27494}  PARTICIPATION LIMITATIONS: {participationrestrictions:25113}  PERSONAL FACTORS: {Personal factors:25162} are also affecting patient's functional outcome.   REHAB POTENTIAL: {rehabpotential:25112}  CLINICAL DECISION MAKING: {clinical decision making:25114}  EVALUATION COMPLEXITY: {Evaluation complexity:25115}  PLAN:  PT FREQUENCY: {rehab frequency:25116}  PT DURATION: {rehab duration:25117}  PLANNED INTERVENTIONS: {rehab planned interventions:25118::"97110-Therapeutic exercises","97530- Therapeutic [phone]- Neuromuscular re-education","97535- Self NWGN","56213- Manual therapy"}  PLAN FOR NEXT SESSION: ***     Katelee Schupp Verdell Face, PT, DPT, NCS, CSRS Physical Therapist - [place]  [facility]  6:23 PM 05/29/23

## 2023-05-30 ENCOUNTER — Ambulatory Visit: Payer: Medicare PPO | Admitting: Physical Therapy

## 2023-05-31 NOTE — Therapy (Unsigned)
OUTPATIENT PHYSICAL THERAPY NEURO EVALUATION   Patient Name: [name] MRN: [id number] DOB:07/19/1950, 73 y.o., male Today's Date: 06/01/2023   PCP: Caffie Pinto, PA-C REFERRING PROVIDER: Suzy Bouchard, PA   END OF SESSION:   PT End of Session - 06/01/23 1147     Visit Number 1    Number of Visits 24    Date for PT Re-Evaluation 08/24/23    Authorization Type [employer] 2025    PT Start Time 1152    PT Stop Time 1234    PT Time Calculation (min) 42 min    Equipment Utilized During Treatment Gait belt    Activity Tolerance Patient tolerated treatment well;No increased pain    Behavior During Therapy WFL for tasks assessed/performed;Flat affect             Past Medical History:  Diagnosis Date   Anxiety disorder    Arthritis    Bradycardia    Cancer (HCC) 2013   skin cancer   Depression    ptsd   Dysrhythmia    chronic slow heart rate   GERD (gastroesophageal reflux disease)    H/O tachycardia-bradycardia syndrome    Headache(784.0)    tension headaches non recent   History of chicken pox    History of kidney stones    passed   Hypertension    treated with HCTZ   Major depressive disorder    Mild aortic regurgitation    Pacemaker    Parkinson's disease (HCC)    dx'ed 15 years ago   PTSD (post-traumatic stress disorder)    S/P deep brain stimulator placement    Shortness of breath dyspnea    Sleep apnea    doesn't use C-pap   Unspecified dementia, moderate, with psychotic disturbance (HCC)    Varicose veins    Past Surgical History:  Procedure Laterality Date   CATARACT EXTRACTION W/PHACO Right 01/17/2023   Procedure: CATARACT EXTRACTION PHACO AND INTRAOCULAR LENS PLACEMENT (IOC) RIGHT 6.33 00:42.6;  Surgeon: Galen Manila, MD;  Location: [facility];  Service: Ophthalmology;  Laterality: Right;   CATARACT EXTRACTION W/PHACO Left 02/07/2023   Procedure: CATARACT EXTRACTION PHACO AND INTRAOCULAR LENS PLACEMENT (IOC) LEFT 6.35  00:38.3;  Surgeon: Galen Manila, MD;  Location: [facility];  Service: Ophthalmology;  Laterality: Left;   CHOLECYSTECTOMY N/A 10/22/2014   Procedure: LAPAROSCOPIC CHOLECYSTECTOMY WITH INTRAOPERATIVE CHOLANGIOGRAM;  Surgeon: Tiney Rouge III, MD;  Location: [facility];  Service: General;  Laterality: N/A;   cyst removed      from lip as a child   INTRAMEDULLARY (IM) NAIL INTERTROCHANTERIC N/A 02/18/2019   Procedure: INTRAMEDULLARY (IM) NAIL INTERTROCHANTRIC, RIGHT,;  Surgeon: Juanell Fairly, MD;  Location: [facility];  Service: Orthopedics;  Laterality: N/A;   LUMBAR LAMINECTOMY/DECOMPRESSION MICRODISCECTOMY Bilateral 12/14/2012   Procedure: Bilateral lumbar three-four, four-five decompressive laminotomy/foraminotomy;  Surgeon: Temple Pacini, MD;  Location: [facility];  Service: Neurosurgery;  Laterality: Bilateral;   PULSE GENERATOR IMPLANT Bilateral 12/13/2013   Procedure: Bilateral implantable pulse generator placement;  Surgeon: Maeola Harman, MD;  Location: [facility];  Service: Neurosurgery;  Laterality: Bilateral;  Bilateral implantable pulse generator placement   skin cancer removed     from ears,   12 lft arm  rt leg 15   SUBTHALAMIC STIMULATOR BATTERY REPLACEMENT Bilateral 07/14/2017   Procedure: BILATERAL IMPLANTED PULSE GENERATOR CHANGE FOR DEEP BRAIN STIMULATOR;  Surgeon: Maeola Harman, MD;  Location: [facility];  Service: Neurosurgery;  Laterality: Bilateral;   SUBTHALAMIC STIMULATOR INSERTION Bilateral  12/06/2013   Procedure: SUBTHALAMIC STIMULATOR INSERTION;  Surgeon: Maeola Harman, MD;  Location: [facility];  Service: Neurosurgery;  Laterality: Bilateral;  Bilateral deep brain stimulator placement   Patient Active Problem List   Diagnosis Date Noted   Hematuria 10/10/2021   Sinus drainage 12/09/2020   Medicare annual wellness visit, subsequent 11/18/2020   Pacemaker    Dementia associated with Parkinson's disease (HCC) 06/17/2020   Vertigo 02/19/2020   Urine abnormality  02/19/2020   Dysphagia 02/23/2019   Fall at home 10/18/2017   REM behavioral disorder 11/02/2016   Radicular pain in right arm 10/09/2016   GERD (gastroesophageal reflux disease) 07/31/2016   Colon cancer screening 07/23/2015   Gout 02/18/2015   Depression 01/06/2015   Skin lesion 10/02/2014   Dupuytren's contracture 08/20/2014   Cough 07/21/2014   Lumbar stenosis with neurogenic claudication 06/13/2014   S/P deep brain stimulator placement 05/08/2014   Advance care planning 01/19/2014   Parkinson's disease (HCC) 12/13/2013   PTSD (post-traumatic stress disorder) 06/13/2013   Erectile dysfunction 06/13/2013   HLD (hyperlipidemia) 06/13/2013   Essential hypertension 06/03/2013   Bradycardia by electrocardiogram 06/03/2013   Obstructive sleep apnea 03/12/2013    ONSET DATE: ~15 years ago when pt was diagnosed with Parkinson's  REFERRING DIAG: G20.A1 (ICD-10-CM) - Parkinsons disease (HCC)   THERAPY DIAG:  Unsteadiness on feet  Abnormality of gait and mobility  Difficulty in walking, not elsewhere classified  Repeated falls  Muscle weakness (generalized)  Parkinson's disease without dyskinesia or fluctuating manifestations (HCC)  Rationale for Evaluation and Treatment: Rehabilitation  SUBJECTIVE:                                                                                                                                                                                             SUBJECTIVE STATEMENT: Pt reports he would like to get tx for recurrent body pains (back and L shoulder) as well as his balance. Pt reports pain in L shoulder from recent fall (details below).  Pt reports wearing arm sling even while walking with Up-walker.  *Of note: pt requires increased time for verbal responses to therapist's questions  Pt accompanied by: self and caregiver (Angelica Bradly Bienenstock)  PERTINENT HISTORY: Parkinson's, deep brain stim, depression, lipids, HTN, GERD, ED, dementia,  seizures/falls, LBP/PSF, hx of lumbar fusion in 04/2022, hx of hip fx 08/2022, arthritis, frequent falls,  and OSA   PAIN:  Are you having pain? Yes: NPRS scale: 7/10 with activity below, but otherwise 3/10 at rest  Pain location: L calvicle Pain description: ache to sharp Aggravating factors: when going to push-up to stand or control himself when  sitting using armrest Relieving factors: rest and support in arm sling  PRECAUTIONS: Fall and Other:    frequent falls, most recent on 05/22/2023 with ED visit, L superolateral clavicular fx (in sling), deep brain stimulator  RED FLAGS: Bowel or bladder incontinence: Yes: "on occasion" has incontinence    WEIGHT BEARING RESTRICTIONS: Yes unsure of specific L UE WBing restrictions, but pt currently using an AD for ambulation, while wearing basic arm sling  FALLS: Has patient fallen in last 6 months? Yes. Number of falls 10+  most recent on 05/22/2023 with injury to L shoulder  LIVING ENVIRONMENT: Lives with: lives with their spouse (wife, Radio broadcast assistant) and has hired caregivers Lives in: House/apartment Stairs: Yes: External: 2-3 steps; bilateral but cannot reach both (due to current home renovations, this is the entrance he uses) primarily uses R HR Has following equipment at home: Wheelchair (manual), shower chair, Grab bars, and Up-Walker  PLOF: Independent with household mobility with device, Requires assistive device for independence, Needs assistance with ADLs, and meal prep   Pt reports he frequently leaves his AD to the side when going to get something, often forgetting it - he requires reinforced education on consistent use of U-step walker  PATIENT GOALS: Improve Balance  OBJECTIVE:  Note: Objective measures were completed at Evaluation unless otherwise noted.  DIAGNOSTIC FINDINGS:   CT CERVICAL SPINE WITHOUT CONTRAST on 05/22/2023 IMPRESSION: Disc levels: Disc space narrowing and spurring in the lower cervical spine. Mild bilateral  degenerative facet disease, left greater than right. Degenerative disc and facet disease throughout the cervical spine. No acute bony abnormality.  CT HEAD WITHOUT CONTRAST on 05/22/2023 FINDINGS: Brain: Deep brain stimulators are noted with the tips in the region of the thalami bilaterally, unchanged. Mild atrophy. No acute intracranial abnormality. Specifically, no hemorrhage, hydrocephalus, mass lesion, acute infarction, or significant intracranial injury. Vascular: No hyperdense vessel or unexpected calcification. Skull: No acute calvarial abnormality. Sinuses/Orbits: No acute findings Other: None IMPRESSION: No acute intracranial abnormality.  LEFT SHOULDER - 2+ VIEW on 05/22/2023 IMPRESSION: Acute mildly displaced fracture of the lateral aspect of the left clavicle with intra-articular extension.   COGNITION: Overall cognitive status: History of cognitive impairments - at baseline and pt with very delayed verbal responses to subjective questioning and noted to be easily distracted in the therapy gym environment   SENSATION: Light touch: Impaired  and decreased distally in B LEs, but specifically decreased throughout R LE compared to L LE   COORDINATION: Not formally assessed  EDEMA:  Not formally assessed, but no significant swelling on visual exam  MUSCLE TONE: WFL  MUSCLE LENGTH: Hamstrings: Right ____ deg; Left ___ deg  *not assessed, but would benefit from testing  DTRs:  Not formally assessed  POSTURE: rounded shoulders, forward head, decreased lumbar lordosis, and posterior pelvic tilt, posterior weight shift bias  LOWER EXTREMITY ROM:     Active  Not formally assessed Right Eval Left Eval  Hip flexion    Hip extension    Hip abduction    Hip adduction    Hip internal rotation    Hip external rotation    Knee flexion    Knee extension    Ankle dorsiflexion Would benefit from testing Would benefit from testing  Ankle plantarflexion    Ankle  inversion    Ankle eversion     (Blank rows = not tested)  LOWER EXTREMITY MMT:    MMT Right Eval Left Eval  Hip flexion 4 4  Hip extension  Hip abduction    Hip adduction    Hip internal rotation    Hip external rotation    Knee flexion 4+ 5  Knee extension 4+ 4+  Ankle dorsiflexion 4- 4  Ankle plantarflexion 4 4-  Ankle inversion    Ankle eversion    (Blank rows = not tested)  Manual Muscle Test Scale 0/5 = No muscle contraction can be seen or felt 1/5 = Contraction can be felt, but there is no motion 2-/5 = Part moves through incomplete ROM w/ gravity decreased 2/5 = Part moves through complete ROM w/ gravity decreased 2+/5 = Part moves through incomplete ROM (<50%) against gravity or through complete ROM w/ gravity 3-/5 = Part moves through incomplete ROM (>50%) against gravity 3/5 = Part moves through complete ROM against gravity 3+/5 = Part moves through complete ROM against gravity/slight resistance 4-/5= Holds test position against slight to moderate pressure 4/5 = Part moves through complete ROM against gravity/moderate resistance 4+/5= Holds test position against moderate to strong pressure 5/5 = Part moves through complete ROM against gravity/full resistance  BED MOBILITY:  Sit to supine Min A and Mod A Supine to sit Min A and Mod A Requires adjustable bed with bedrail at home   TRANSFERS: Assistive device utilized:  Up-Walker   Sit to stand: SBA, CGA, and Min A Stand to sit: SBA, CGA, and Min A Chair to chair: CGA and Min A Floor:  Not assessed **Requires variable assist levels for transfers due to imbalance  RAMP:  Level of Assistance:    Assistive device utilized:  Ramp Comments: Not formally assessed, may benefit from testing in future  CURB:  Level of Assistance:  Assistive device utilized:  Curb Comments: Not formally assessed, may benefit from testing in future  STAIRS: Level of Assistance:    Stair Negotiation Technique: Forwards with  Bilateral Rails  Number of Stairs: 4  Height of Stairs: 6inch  Comments: Need to assess  GAIT: Gait pattern: step through pattern, decreased step length- Right, decreased step length- Left, decreased stride length, knee flexed in stance- Right, knee flexed in stance- Left, shuffling, festinating, trunk flexed, wide BOS, poor foot clearance- Right, and poor foot clearance- Left Distance walked: ~146ft Assistive device utilized:  Up-Walker Level of assistance: CGA and Min A Comments: Noticeable festination when turning, stepping back to sit, or side stepping  FUNCTIONAL TESTS:  5 times sit to stand: 22 seconds Timed up and go (TUG): need to assess 10 meter walk test: 0.62m/s Berg Balance Scale: 20/56  PATIENT SURVEYS:  ABC scale need to assess                                                                                                                              TREATMENT DATE:   06/01/23   Five times Sit to Stand Test (FTSS) "Stand up and sit down as quickly as possible 5 times, keeping your arms folded across your chest."  TIME: 22 seconds, with pt having posterior LOB and uncontrolled returned to sitting in chair   Times > 13.6 seconds is associated with increased disability and morbidity (Guralnik, 2000) Times > 15 seconds is predictive of recurrent falls in healthy individuals aged 8 and older (Buatois, et al., 2008) Normal performance values in community dwelling individuals aged 74 and older (Bohannon, 2006): 60-69 years: 11.4 seconds 70-79 years: 12.6 seconds 80-89 years: 14.8 seconds  MCID: >= 2.3 seconds for Vestibular Disorders (Meretta, 2006)   10 Meter Walk Test: Patient instructed to walk 10 meters (32.8 ft) as quickly and as safely as possible at their normal speed Results: 0.49 m/s using Up-Walker (Required 20.32 seconds and 19.90 seconds)  Cut off scores:   Household Ambulator  < 0.4 m/s  Limited Community Ambulator  0.4 - 0.8 m/s   [employer]  > 0.8 m/s  Increased fall risk  < 1.26m/s  Crossing a Street  >1.3m/s  MCID 0.05 m/s (small), 0.13 m/s (moderate), 0.06 m/s (significant)  (ANPTA Core Set of Outcome Measures for Adults with Neurologic Conditions, 2018)     [facility] PT Assessment - 06/01/23 0001       Berg Balance Test   Sit to Stand Able to stand using hands after several tries    Standing Unsupported Able to stand 2 minutes with supervision    Sitting with Back Unsupported but Feet Supported on Floor or Stool Able to sit safely and securely 2 minutes    Stand to Sit Uses backs of legs against chair to control descent    Transfers Needs one person to assist   if not using AD, due to posterior LOB   Standing Unsupported with Eyes Closed Able to stand 10 seconds with supervision   wide BOS and slight bend in knees to keep balance   Standing Unsupported with Feet Together Able to place feet together independently but unable to hold for 30 seconds   minor R instability at 20seconds   From Standing, Reach Forward with Outstretched Arm Reaches forward but needs supervision    From Standing Position, Pick up Object from Floor Unable to pick up and needs supervision    From Standing Position, Turn to Look Behind Over each Shoulder Needs assist to keep from losing balance and falling    Turn 360 Degrees Needs assistance while turning    Standing Unsupported, Alternately Place Feet on Step/Stool Needs assistance to keep from falling or unable to try    Standing Unsupported, One Foot in Front Needs help to step but can hold 15 seconds    Standing on One Leg Unable to try or needs assist to prevent fall    Total Score 20            Patient participated in [employer] and demonstrates increased fall risk as noted by score of   20/56.  (<36= high risk for falls, close to 100%; 37-45 significant >80%; 46-51 moderate >50%; 52-55 lower >25%).    PATIENT EDUCATION: Education details: PT POC, LTGs, findings  on balance assessments today Person educated: Patient and Caregiver Angelica Education method: Explanation Education comprehension: verbalized understanding and needs further education  HOME EXERCISE PROGRAM: Needs to be provided in next 1-2 visits  GOALS: Goals reviewed with patient? Yes  SHORT TERM GOALS: Target date: 07/13/2023    Patient will be independent in home exercise program to improve strength/mobility for better functional independence with ADLs. Baseline: need to be provided HEP Goal status:  INITIAL  2. Patient will report <2 falls over past 6 weeks to demonstrate improved safety awareness at home.   Baseline: 06/01/2023 = 10+ falls in past 6 months  Goal Status: INITIAL  LONG TERM GOALS: Target date: 08/24/2023   1.  Patient (> 44 years old) will complete five times sit to stand test in < 15 seconds indicating an increased LE strength and improved balance. Baseline: 06/01/23: 22 seconds with posterior LOB Goal status: INITIAL  2.  Patient will increase Berg Balance score by > 6 points to demonstrate decreased fall risk during functional activities. Baseline: 06/01/23: 20/56 Goal status: INITIAL   3. Patient will reduce timed up and go to <11 seconds to reduce fall risk and demonstrate improved transfer/gait ability. Baseline: need to test Goal status: INITIAL  4.Patient will increase 10 meter walk test to >1.69m/s as to improve gait speed for better community ambulation and to reduce fall risk. Baseline: 06/01/23: 0.42m/s using Up-Walker Goal status: INITIAL  5.  Patient will be require no more than supervision assist with ascending/descending 3 steps using handrails per home set-up.  Baseline: need to assess  Goal status: INITIAL   ASSESSMENT:  CLINICAL IMPRESSION: Patient is a 73 y.o. male who was seen today for physical therapy evaluation and treatment for imbalance due to Parkinson's disease. Pt demonstrates significantly increased fall risk as noted by  Sharlene Motts Balance Test and . Patient with noticeable posterior lena bias as well as impaired cognition with difficulty sustaining attention in distracting environment impacting his balance recovery strategies. Pt also appears to have overall decreased awareness of his deficits. Pt will benefit from skilled physical therapy to address his balance deficits in order to decrease his fall risk and promote increased independence with functional mobility and ADLs.   OBJECTIVE IMPAIRMENTS: Abnormal gait, decreased activity tolerance, decreased balance, decreased cognition, decreased endurance, decreased knowledge of condition, decreased knowledge of use of DME, decreased mobility, difficulty walking, decreased strength, decreased safety awareness, hypomobility, impaired flexibility, impaired sensation, improper body mechanics, postural dysfunction, and pain.   ACTIVITY LIMITATIONS: carrying, lifting, bending, standing, squatting, sleeping, stairs, transfers, bed mobility, continence, bathing, toileting, dressing, reach over head, hygiene/grooming, and locomotion level  PARTICIPATION LIMITATIONS: meal prep, cleaning, laundry, medication management, driving, and community activity  PERSONAL FACTORS: Age, Sex, Time since onset of injury/illness/exacerbation, and 3+ comorbidities: Parkinson's, deep brain stim, depression, lipids, HTN, GERD, ED, dementia, seizures/falls, LBP/PSF, hx of lumbar fusion in 04/2022, hx of hip fx 08/2022, arthritis, frequent falls,  and OSA  are also affecting patient's functional outcome.   REHAB POTENTIAL: Good  CLINICAL DECISION MAKING: Evolving/moderate complexity  EVALUATION COMPLEXITY: Moderate  PLAN:  PT FREQUENCY: 1-2x/week  PT DURATION: 12 weeks  PLANNED INTERVENTIONS: 97164- PT Re-evaluation, 97110-Therapeutic exercises, 97530- Therapeutic activity, 97112- Neuromuscular re-education, 97535- Self Care, 09811- Manual therapy, [phone]- Gait training, [phone]- Orthotic  Fit/training, [phone]- Electrical stimulation (manual), Patient/Family education, Balance training, Stair training, Taping, Joint mobilization, Joint manipulation, Spinal mobilization, Vestibular training, Cognitive remediation, DME instructions, Cryotherapy, and Moist heat  PLAN FOR NEXT SESSION:  - ABC scale  - TUG - assess stairs - check for updated orders for L shoulder clavicular fx - HS stretch/ROM assessment - ankle DF stretch/ROM - posterior lean bias - dynamic balance (strong posterior lean/LOB bias)  - dynamic gait both with and without Up-Walker   Ginny Forth, PT, DPT, NCS, CSRS Physical Therapist - [place]  [facility]  1:20 PM 06/01/23

## 2023-06-01 ENCOUNTER — Other Ambulatory Visit: Payer: Self-pay

## 2023-06-01 ENCOUNTER — Ambulatory Visit: Payer: Medicare PPO | Attending: Physician Assistant | Admitting: Physical Therapy

## 2023-06-01 ENCOUNTER — Encounter: Payer: Self-pay | Admitting: Physical Therapy

## 2023-06-01 DIAGNOSIS — R2689 Other abnormalities of gait and mobility: Secondary | ICD-10-CM | POA: Insufficient documentation

## 2023-06-01 DIAGNOSIS — G20A1 Parkinson's disease without dyskinesia, without mention of fluctuations: Secondary | ICD-10-CM

## 2023-06-01 DIAGNOSIS — R278 Other lack of coordination: Secondary | ICD-10-CM | POA: Insufficient documentation

## 2023-06-01 DIAGNOSIS — R262 Difficulty in walking, not elsewhere classified: Secondary | ICD-10-CM | POA: Diagnosis present

## 2023-06-01 DIAGNOSIS — R269 Unspecified abnormalities of gait and mobility: Secondary | ICD-10-CM | POA: Diagnosis present

## 2023-06-01 DIAGNOSIS — R296 Repeated falls: Secondary | ICD-10-CM

## 2023-06-01 DIAGNOSIS — R2681 Unsteadiness on feet: Secondary | ICD-10-CM | POA: Diagnosis present

## 2023-06-01 DIAGNOSIS — M6281 Muscle weakness (generalized): Secondary | ICD-10-CM

## 2023-06-05 ENCOUNTER — Ambulatory Visit: Payer: Medicare PPO | Admitting: Physical Therapy

## 2023-06-06 ENCOUNTER — Ambulatory Visit: Payer: Medicare PPO | Admitting: Psychology

## 2023-06-06 DIAGNOSIS — F4323 Adjustment disorder with mixed anxiety and depressed mood: Secondary | ICD-10-CM | POA: Diagnosis not present

## 2023-06-06 DIAGNOSIS — F331 Major depressive disorder, recurrent, moderate: Secondary | ICD-10-CM

## 2023-06-06 NOTE — Progress Notes (Unsigned)
                edge,  experiencing concentration difficulties, having trouble falling or staying asleep, exhibiting a general  state of irritability).: No Description Entered (Status: improved). Motor tension (e.g., restlessness,  tiredness, shakiness, muscle tension).: No Description Entered (Status: improved).  Problems Addressed  Anxiety, Phase Of Life Problems, Anxiety  Goals 1. Learn and implement coping skills that result in a reduction of anxiety  and worry, and improved daily functioning. Objective Learn  and implement calming skills to reduce overall anxiety and manage anxiety symptoms. Target Date: 2025-08-09Frequency: Weekly Progress: 40 Modality: individual  Related Interventions 1. Teach the client calming/relaxation skills (e.g., applied relaxation, progressive muscle  relaxation, cue controlled relaxation; mindful breathing; biofeedback) and how to discriminate  better between relaxation and tension; teach the client how to apply these skills to his/her daily  life (e.g., New Directions in Progressive Muscle Relaxation by Marcelyn Ditty, and  Hazlett-Stevens; Treating Generalized Anxiety Disorder by Rygh and Ida Rogue). Objective Identify, challenge, and replace biased, fearful self-talk with positive, realistic, and empowering selftalk. Target Date: 2023-11-25 Frequency: weekly Progress: 30 Modality: individual Related Interventions 1. Explore the client's schema and self-talk that mediate his/her fear response; assist him/her in  challenging the biases; replace the distorted messages with reality-based alternatives and  positive, realistic self-talk that will increase his/her self-confidence in coping with irrational  fears (see Cognitive Therapy of Anxiety Disorders by Laurence Slate). Objective Learn and implement problem-solving strategies for realistically addressing worries. Target Date: 2025-08-09Frequency: weekly Progress: 40 Modality: individual 2. Resolve conflicted feelings and adapt to the new life circumstances. Objective Apply problem-solving skills to current circumstances. Target Date: 2023-11-25 Frequency: weekly Progress: 20 Modality: individual Related Interventions 1. Teach the client problem-resolution skills (e.g., defining the problem clearly, brainstorming  multiple solutions, listing the pros and cons of each solution, seeking input from others,  selecting and implementing a plan of action, evaluating outcome, and readjusting plan as   necessary).   3. Stabilize anxiety level while increasing ability to function on a daily  basis. Diagnosis F33.1  Major depressive disorder, moderate 300.02 (Generalized anxiety disorder) - Open - [Signifier: n/a]  Axis  none 309.28 (Adjustment disorder with mixed anxiety and depressed  mood) - Open - [Signifier: n/a]  Adjustment Disorder,  With Anxiety   Marital conflict  Major Depressive disorder, moderate  Conditions For Discharge Achievement of treatment goals and objectives.  The patient approved this plan.   Deonna Krummel G Ethridge Sollenberger, LCSW

## 2023-06-08 ENCOUNTER — Ambulatory Visit: Payer: Medicare PPO | Admitting: Physical Therapy

## 2023-06-08 NOTE — Progress Notes (Signed)
Viral symptoms concerning for covid/flu

## 2023-06-09 LAB — CUP PACEART REMOTE DEVICE CHECK
Battery Remaining Longevity: 131 mo
Battery Voltage: 3.02 V
Brady Statistic AP VP Percent: 0.04 %
Brady Statistic AP VS Percent: 95.83 %
Brady Statistic AS VP Percent: 0 %
Brady Statistic AS VS Percent: 4.13 %
Brady Statistic RA Percent Paced: 95.86 %
Brady Statistic RV Percent Paced: 0.05 %
Date Time Interrogation Session: 20250205003523
Implantable Lead Connection Status: 753985
Implantable Lead Connection Status: 753985
Implantable Lead Implant Date: 20220506
Implantable Lead Implant Date: 20220506
Implantable Lead Location: 753859
Implantable Lead Location: 753860
Implantable Lead Model: 5076
Implantable Lead Model: 5076
Implantable Pulse Generator Implant Date: 20220506
Lead Channel Impedance Value: 323 Ohm
Lead Channel Impedance Value: 361 Ohm
Lead Channel Impedance Value: 399 Ohm
Lead Channel Impedance Value: 437 Ohm
Lead Channel Pacing Threshold Amplitude: 0.625 V
Lead Channel Pacing Threshold Amplitude: 0.75 V
Lead Channel Pacing Threshold Pulse Width: 0.4 ms
Lead Channel Pacing Threshold Pulse Width: 0.4 ms
Lead Channel Sensing Intrinsic Amplitude: 2 mV
Lead Channel Sensing Intrinsic Amplitude: 2 mV
Lead Channel Sensing Intrinsic Amplitude: 8.125 mV
Lead Channel Sensing Intrinsic Amplitude: 8.125 mV
Lead Channel Setting Pacing Amplitude: 1.5 V
Lead Channel Setting Pacing Amplitude: 2 V
Lead Channel Setting Pacing Pulse Width: 0.4 ms
Lead Channel Setting Sensing Sensitivity: 1.2 mV
Zone Setting Status: 755011
Zone Setting Status: 755011

## 2023-06-12 ENCOUNTER — Ambulatory Visit: Payer: Medicare PPO | Admitting: Physical Therapy

## 2023-06-12 DIAGNOSIS — R269 Unspecified abnormalities of gait and mobility: Secondary | ICD-10-CM

## 2023-06-12 DIAGNOSIS — M6281 Muscle weakness (generalized): Secondary | ICD-10-CM

## 2023-06-12 DIAGNOSIS — R2681 Unsteadiness on feet: Secondary | ICD-10-CM

## 2023-06-12 DIAGNOSIS — R2689 Other abnormalities of gait and mobility: Secondary | ICD-10-CM

## 2023-06-12 DIAGNOSIS — R296 Repeated falls: Secondary | ICD-10-CM

## 2023-06-12 DIAGNOSIS — R278 Other lack of coordination: Secondary | ICD-10-CM

## 2023-06-12 DIAGNOSIS — R262 Difficulty in walking, not elsewhere classified: Secondary | ICD-10-CM

## 2023-06-12 NOTE — Therapy (Signed)
 OUTPATIENT PHYSICAL THERAPY NEURO TREATMENT   Patient Name: [name] MRN: [id number] DOB:21-Nov-1950, 73 y.o., male Today's Date: 06/12/2023   PCP: Caffie Pinto, PA-C REFERRING PROVIDER: Suzy Bouchard, PA   END OF SESSION:   PT End of Session - 06/12/23 1055     Visit Number 2    Number of Visits 24    Date for PT Re-Evaluation 08/24/23    Authorization Type [employer] [id number]    PT Start Time 1100    PT Stop Time 1146    PT Time Calculation (min) 46 min    Equipment Utilized During Treatment Gait belt    Activity Tolerance Patient tolerated treatment well;No increased pain    Behavior During Therapy WFL for tasks assessed/performed;Flat affect              Past Medical History:  Diagnosis Date   Anxiety disorder    Arthritis    Bradycardia    Cancer (HCC) 2013   skin cancer   Depression    ptsd   Dysrhythmia    chronic slow heart rate   GERD (gastroesophageal reflux disease)    H/O tachycardia-bradycardia syndrome    Headache(784.0)    tension headaches non recent   History of chicken pox    History of kidney stones    passed   Hypertension    treated with HCTZ   Major depressive disorder    Mild aortic regurgitation    Pacemaker    Parkinson's disease (HCC)    dx'ed 15 years ago   PTSD (post-traumatic stress disorder)    S/P deep brain stimulator placement    Shortness of breath dyspnea    Sleep apnea    doesn't use C-pap   Unspecified dementia, moderate, with psychotic disturbance (HCC)    Varicose veins    Past Surgical History:  Procedure Laterality Date   CATARACT EXTRACTION W/PHACO Right 01/17/2023   Procedure: CATARACT EXTRACTION PHACO AND INTRAOCULAR LENS PLACEMENT (IOC) RIGHT 6.33 00:42.6;  Surgeon: Galen Manila, MD;  Location: [facility];  Service: Ophthalmology;  Laterality: Right;   CATARACT EXTRACTION W/PHACO Left 02/07/2023   Procedure: CATARACT EXTRACTION PHACO AND INTRAOCULAR LENS PLACEMENT (IOC) LEFT 6.35  00:38.3;  Surgeon: Galen Manila, MD;  Location: [facility];  Service: Ophthalmology;  Laterality: Left;   CHOLECYSTECTOMY N/A 10/22/2014   Procedure: LAPAROSCOPIC CHOLECYSTECTOMY WITH INTRAOPERATIVE CHOLANGIOGRAM;  Surgeon: Tiney Rouge III, MD;  Location: [facility];  Service: General;  Laterality: N/A;   cyst removed      from lip as a child   INTRAMEDULLARY (IM) NAIL INTERTROCHANTERIC N/A 02/18/2019   Procedure: INTRAMEDULLARY (IM) NAIL INTERTROCHANTRIC, RIGHT,;  Surgeon: Juanell Fairly, MD;  Location: [facility];  Service: Orthopedics;  Laterality: N/A;   LUMBAR LAMINECTOMY/DECOMPRESSION MICRODISCECTOMY Bilateral 12/14/2012   Procedure: Bilateral lumbar three-four, four-five decompressive laminotomy/foraminotomy;  Surgeon: Temple Pacini, MD;  Location: [facility];  Service: Neurosurgery;  Laterality: Bilateral;   PULSE GENERATOR IMPLANT Bilateral 12/13/2013   Procedure: Bilateral implantable pulse generator placement;  Surgeon: Maeola Harman, MD;  Location: [facility];  Service: Neurosurgery;  Laterality: Bilateral;  Bilateral implantable pulse generator placement   skin cancer removed     from ears,   12 lft arm  rt leg 15   SUBTHALAMIC STIMULATOR BATTERY REPLACEMENT Bilateral 07/14/2017   Procedure: BILATERAL IMPLANTED PULSE GENERATOR CHANGE FOR DEEP BRAIN STIMULATOR;  Surgeon: Maeola Harman, MD;  Location: [facility];  Service: Neurosurgery;  Laterality: Bilateral;   SUBTHALAMIC STIMULATOR INSERTION  Bilateral 12/06/2013   Procedure: SUBTHALAMIC STIMULATOR INSERTION;  Surgeon: Maeola Harman, MD;  Location: [facility];  Service: Neurosurgery;  Laterality: Bilateral;  Bilateral deep brain stimulator placement   Patient Active Problem List   Diagnosis Date Noted   Hematuria 10/10/2021   Sinus drainage 12/09/2020   Medicare annual wellness visit, subsequent 11/18/2020   Pacemaker    Dementia associated with Parkinson's disease (HCC) 06/17/2020   Vertigo 02/19/2020   Urine abnormality  02/19/2020   Dysphagia 02/23/2019   Fall at home 10/18/2017   REM behavioral disorder 11/02/2016   Radicular pain in right arm 10/09/2016   GERD (gastroesophageal reflux disease) 07/31/2016   Colon cancer screening 07/23/2015   Gout 02/18/2015   Depression 01/06/2015   Skin lesion 10/02/2014   Dupuytren's contracture 08/20/2014   Cough 07/21/2014   Lumbar stenosis with neurogenic claudication 06/13/2014   S/P deep brain stimulator placement 05/08/2014   Advance care planning 01/19/2014   Parkinson's disease (HCC) 12/13/2013   PTSD (post-traumatic stress disorder) 06/13/2013   Erectile dysfunction 06/13/2013   HLD (hyperlipidemia) 06/13/2013   Essential hypertension 06/03/2013   Bradycardia by electrocardiogram 06/03/2013   Obstructive sleep apnea 03/12/2013    ONSET DATE: ~15 years ago when pt was diagnosed with Parkinson's  REFERRING DIAG: G20.A1 (ICD-10-CM) - Parkinsons disease (HCC)   THERAPY DIAG:  Abnormality of gait and mobility  Unsteadiness on feet  Difficulty in walking, not elsewhere classified  Repeated falls  Muscle weakness (generalized)  Other abnormalities of gait and mobility  Other lack of coordination  Rationale for Evaluation and Treatment: Rehabilitation  SUBJECTIVE:                                                                                                                                                                                             SUBJECTIVE STATEMENT:  Reports he is doing "fairly cloudy." States it went well with the OT home assessment, but he gets very upset when being told to wait before standing up. Reports he thinks he's had 1 fall since last PT visit, but pt unable to recall. Reports he accidentally forgot his Up-Walker at home.   Pt still wearing basic L UE sling this morning upon arrival to therapy session. Pt reports his L UE is still "pretty rough" without further elaboration. Pt unable to recall if he has an  appointment to follow-up on LUE injury (therapist asked his caregiver to follow-up on this prior to next PT visit).  *Of note: pt requires increased time for verbal responses to therapist's questions  Pt accompanied by: self (caregiver waiting at front desk)  PERTINENT HISTORY: Parkinson's,  deep brain stim, depression, lipids, HTN, GERD, ED, dementia, seizures/falls, LBP/PSF, hx of lumbar fusion in 04/2022, hx of hip fx 08/2022, arthritis, frequent falls,  and OSA   PAIN:  Are you having pain? Yes: NPRS scale: 7/10 with activity below, but otherwise 3/10 at rest  Pain location: L calvicle Pain description: ache to sharp Aggravating factors: when going to push-up to stand or control himself when sitting using armrest Relieving factors: rest and support in arm sling  PRECAUTIONS: Fall and Other:    frequent falls, most recent on 05/22/2023 with ED visit, L superolateral clavicular fx (in sling), deep brain stimulator  RED FLAGS: Bowel or bladder incontinence: Yes: "on occasion" has incontinence    WEIGHT BEARING RESTRICTIONS: Yes unsure of specific L UE WBing restrictions, but pt currently using an AD for ambulation, while wearing basic arm sling  FALLS: Has patient fallen in last 6 months? Yes. Number of falls 10+  most recent on 05/22/2023 with injury to L shoulder  LIVING ENVIRONMENT: Lives with: lives with their spouse (wife, Radio broadcast assistant) and has hired caregivers Lives in: House/apartment Stairs: Yes: External: 2-3 steps; bilateral but cannot reach both (due to current home renovations, this is the entrance he uses) primarily uses R HR Has following equipment at home: Wheelchair (manual), shower chair, Grab bars, and Up-Walker  PLOF: Independent with household mobility with device, Requires assistive device for independence, Needs assistance with ADLs, and meal prep   Pt reports he frequently leaves his AD to the side when going to get something, often forgetting it - he requires reinforced  education on consistent use of U-step walker  PATIENT GOALS: Improve Balance  OBJECTIVE:  Note: Objective measures were completed at Evaluation unless otherwise noted.  DIAGNOSTIC FINDINGS:   CT CERVICAL SPINE WITHOUT CONTRAST on 05/22/2023 IMPRESSION: Disc levels: Disc space narrowing and spurring in the lower cervical spine. Mild bilateral degenerative facet disease, left greater than right. Degenerative disc and facet disease throughout the cervical spine. No acute bony abnormality.  CT HEAD WITHOUT CONTRAST on 05/22/2023 FINDINGS: Brain: Deep brain stimulators are noted with the tips in the region of the thalami bilaterally, unchanged. Mild atrophy. No acute intracranial abnormality. Specifically, no hemorrhage, hydrocephalus, mass lesion, acute infarction, or significant intracranial injury. Vascular: No hyperdense vessel or unexpected calcification. Skull: No acute calvarial abnormality. Sinuses/Orbits: No acute findings Other: None IMPRESSION: No acute intracranial abnormality.  LEFT SHOULDER - 2+ VIEW on 05/22/2023 IMPRESSION: Acute mildly displaced fracture of the lateral aspect of the left clavicle with intra-articular extension.   COGNITION: Overall cognitive status: History of cognitive impairments - at baseline and pt with very delayed verbal responses to subjective questioning and noted to be easily distracted in the therapy gym environment   SENSATION: Light touch: Impaired  and decreased distally in B LEs, but specifically decreased throughout R LE compared to L LE   COORDINATION: Not formally assessed  EDEMA:  Not formally assessed, but no significant swelling on visual exam  MUSCLE TONE: WFL  MUSCLE LENGTH: Hamstrings: Right ____ deg; Left ___ deg  *not assessed, but would benefit from testing  DTRs:  Not formally assessed  POSTURE: rounded shoulders, forward head, decreased lumbar lordosis, and posterior pelvic tilt, posterior weight shift  bias  LOWER EXTREMITY ROM:     Active  Not formally assessed Right Eval Left Eval  Hip flexion    Hip extension    Hip abduction    Hip adduction    Hip internal rotation    Hip  external rotation    Knee flexion    Knee extension    Ankle dorsiflexion Would benefit from testing Would benefit from testing  Ankle plantarflexion    Ankle inversion    Ankle eversion     (Blank rows = not tested)  LOWER EXTREMITY MMT:    MMT Right Eval Left Eval  Hip flexion 4 4  Hip extension    Hip abduction    Hip adduction    Hip internal rotation    Hip external rotation    Knee flexion 4+ 5  Knee extension 4+ 4+  Ankle dorsiflexion 4- 4  Ankle plantarflexion 4 4-  Ankle inversion    Ankle eversion    (Blank rows = not tested)  Manual Muscle Test Scale 0/5 = No muscle contraction can be seen or felt 1/5 = Contraction can be felt, but there is no motion 2-/5 = Part moves through incomplete ROM w/ gravity decreased 2/5 = Part moves through complete ROM w/ gravity decreased 2+/5 = Part moves through incomplete ROM (<50%) against gravity or through complete ROM w/ gravity 3-/5 = Part moves through incomplete ROM (>50%) against gravity 3/5 = Part moves through complete ROM against gravity 3+/5 = Part moves through complete ROM against gravity/slight resistance 4-/5= Holds test position against slight to moderate pressure 4/5 = Part moves through complete ROM against gravity/moderate resistance 4+/5= Holds test position against moderate to strong pressure 5/5 = Part moves through complete ROM against gravity/full resistance  BED MOBILITY:  Sit to supine Min A and Mod A Supine to sit Min A and Mod A Requires adjustable bed with bedrail at home   TRANSFERS: Assistive device utilized:  Up-Walker   Sit to stand: SBA, CGA, and Min A Stand to sit: SBA, CGA, and Min A Chair to chair: CGA and Min A Floor:  Not assessed **Requires variable assist levels for transfers due to  imbalance  RAMP:  Level of Assistance:    Assistive device utilized:  Ramp Comments: Not formally assessed, may benefit from testing in future  CURB:  Level of Assistance:  Assistive device utilized:  Curb Comments: Not formally assessed, may benefit from testing in future  STAIRS: Level of Assistance:    Stair Negotiation Technique: Forwards with Bilateral Rails  Number of Stairs: 4  Height of Stairs: 6inch  Comments: Need to assess  GAIT: Gait pattern: step through pattern, decreased step length- Right, decreased step length- Left, decreased stride length, knee flexed in stance- Right, knee flexed in stance- Left, shuffling, festinating, trunk flexed, wide BOS, poor foot clearance- Right, and poor foot clearance- Left Distance walked: ~158ft Assistive device utilized:  Up-Walker Level of assistance: CGA and Min A Comments: Noticeable festination when turning, stepping back to sit, or side stepping  FUNCTIONAL TESTS:  5 times sit to stand: 22 seconds Timed up and go (TUG): 06/12/23: 29.40 seconds using Up-Walker with CGA/min A 10 meter walk test: 0.93m/s Berg Balance Scale: 20/56  PATIENT SURVEYS:  ABC scale 06/12/23: 30%  TREATMENT DATE:   06/12/23  Activities-specific Balance Confidence Scale:  Score: 30% - pt with impaired insight into deficits with basic functional mobility tasks as noted with him rating walking in house, navigating stairs, standing on toes to reach item above his head, getting in/out of car, and walking across parking lot at 50-60% despite pt currently requiring CGA/SBA for safety with these tasks and pt also rated his confidence with sweeping the floor to be 70% otherwise pt rated remaining items 30% or 0% Increased risk of falls in community-dwelling, older adults <80% (79.89%)  0% = no confidence - 100% = complete  confidence (ANPTA Core Set of Outcome Measures for Adults with Neurologic Conditions, 2018)  No complaints of L UE pain during session, pt wearing basic L UE sling throughout.  Participated in Timed Up and Go (TUG) using [facility]'s Up-Walker due to pt forgetting his at home. 1st trial: 52.92 seconds 2nd trial: 29.40 seconds using Up-Walker with pt having better management of AD this time *Requires CGA/light min A for balance during both trials  Patient demonstrates high fall risk as indicated by requiring >13.5seconds to complete the TUG.     Gait training ~52ft + ~30ft, no AD, working on dynamic balance with increased gait speed - noticed slight R posterior lean although slight L lateral trunk flexion - excessive thoracic rounding throughout with decreased gait speed and decreased step lengths bilaterally although reciprocal.   Stair navigation training ascending/descending 8 steps (6" height) using R UE support on each HR with CGA/light min assist for balance - self-selects reciprocal stepping pattern in both directions - on ascent only places half of foot up on step, when turning at top has posterior lean/minor LOB, and during descent has minor posterior lean throughout.  Reports R buttocks/posterior hip pain, stating "when it hits me, it hits me good, but it isn't bothering me too bad right now." Denies increase in this pain throughout session and it does not limit his participation.  Started with alternating foot taps to exercise step and then pt self progressed to stepping forward/backwards on/off the step.   Stepping forward/backwards on/off exercise step progressed to adding dual-cognitive and head rotation task of locating 1 of 3 Blaze Pods (center, R, and L) set on random to force head rotations - pt occasionally trying to tap target that was not lit up with decreased understanding of task.  Pt reports this exercise as "underwelming" and did notice when pt with primary focus on avoiding  posterior LOB he was able to maintain balance with decreased assistance, but when distracted or not attending to balance then posterior LOB was more prevalent.  Pt is aware this his primary LOB is posteriorly but has delayed awareness of it in the moment with delayed onset of balance recovery strategies.   Continue working on sit>stands without having posterior LOB as well as posterior LOB recovery training.    PATIENT EDUCATION: Education details: PT POC, LTGs, findings on balance assessments today Person educated: Patient and Caregiver Angelica Education method: Explanation Education comprehension: verbalized understanding and needs further education  HOME EXERCISE PROGRAM: Needs to be provided in next 1-2 visits  GOALS: Goals reviewed with patient? Yes  SHORT TERM GOALS: Target date: 07/13/2023    Patient will be independent in home exercise program to improve strength/mobility for better functional independence with ADLs. Baseline: need to be provided HEP Goal status: INITIAL  2. Patient will report <2 falls over past 6 weeks to demonstrate improved safety awareness at home.  Baseline: 06/01/2023 = 10+ falls in past 6 months  Goal Status: INITIAL  LONG TERM GOALS: Target date: 08/24/2023   1.  Patient (> 69 years old) will complete five times sit to stand test in < 15 seconds indicating an increased LE strength and improved balance. Baseline: 06/01/23: 22 seconds with posterior LOB Goal status: INITIAL  2.  Patient will increase Berg Balance score by > 6 points to demonstrate decreased fall risk during functional activities. Baseline: 06/01/23: 20/56 Goal status: INITIAL   3. Patient will reduce timed up and go to <11 seconds to reduce fall risk and demonstrate improved transfer/gait ability. Baseline: 06/12/23: 29.40 seconds using Up-Walker with CGA/min A Goal status: INITIAL  4.Patient will increase 10 meter walk test to >1.70m/s as to improve gait speed for better  community ambulation and to reduce fall risk. Baseline: 06/01/23: 0.83m/s using Up-Walker Goal status: INITIAL  5.  Patient will require no more than supervision assist with ascending/descending 3 steps using handrails per home set-up.  Baseline: 06/12/23: requires min A using HR due to posterior lean/LOB especially when turning at the top  Goal status: INITIAL   ASSESSMENT:  CLINICAL IMPRESSION:  Patient is a 73 y.o. male who was seen today for physical therapy treatment for imbalance due to Parkinson's disease. Patient with noticeable posterior lean bias as well as impaired cognition with difficulty sustaining attention in distracting environment impacting his balance recovery strategies. Pt also appears to have overall decreased awareness of his deficits. Patient participated in dynamic balance and dual-cognitive-task intervention requiring min A due to repeated posterior lean/LOB noticed primarily when coming to stand and when stepping posteriorly. Pt will benefit from skilled physical therapy to address his balance deficits in order to decrease his fall risk and promote increased independence with functional mobility and ADLs.   OBJECTIVE IMPAIRMENTS: Abnormal gait, decreased activity tolerance, decreased balance, decreased cognition, decreased endurance, decreased knowledge of condition, decreased knowledge of use of DME, decreased mobility, difficulty walking, decreased strength, decreased safety awareness, hypomobility, impaired flexibility, impaired sensation, improper body mechanics, postural dysfunction, and pain.   ACTIVITY LIMITATIONS: carrying, lifting, bending, standing, squatting, sleeping, stairs, transfers, bed mobility, continence, bathing, toileting, dressing, reach over head, hygiene/grooming, and locomotion level  PARTICIPATION LIMITATIONS: meal prep, cleaning, laundry, medication management, driving, and community activity  PERSONAL FACTORS: Age, Sex, Time since onset of  injury/illness/exacerbation, and 3+ comorbidities: Parkinson's, deep brain stim, depression, lipids, HTN, GERD, ED, dementia, seizures/falls, LBP/PSF, hx of lumbar fusion in 04/2022, hx of hip fx 08/2022, arthritis, frequent falls,  and OSA  are also affecting patient's functional outcome.   REHAB POTENTIAL: Good  CLINICAL DECISION MAKING: Evolving/moderate complexity  EVALUATION COMPLEXITY: Moderate  PLAN:  PT FREQUENCY: 1-2x/week  PT DURATION: 12 weeks  PLANNED INTERVENTIONS: 97164- PT Re-evaluation, 97110-Therapeutic exercises, 97530- Therapeutic activity, 97112- Neuromuscular re-education, 97535- Self Care, 13086- Manual therapy, [phone]- Gait training, [phone]- Orthotic Fit/training, [phone]- Electrical stimulation (manual), Patient/Family education, Balance training, Stair training, Taping, Joint mobilization, Joint manipulation, Spinal mobilization, Vestibular training, Cognitive remediation, DME instructions, Cryotherapy, and Moist heat  PLAN FOR NEXT SESSION:  - check for updated orders for L shoulder clavicular fx - HS stretch/ROM assessment - ankle DF stretch/ROM - posterior lean bias - dynamic balance (strong posterior lean/LOB bias)  - dynamic gait both with and without Up-Walker   Ginny Forth, PT, DPT, NCS, CSRS Physical Therapist - [facility]  6:23 PM 06/12/23

## 2023-06-13 ENCOUNTER — Encounter: Payer: Self-pay | Admitting: Cardiology

## 2023-06-14 ENCOUNTER — Ambulatory Visit: Payer: Medicare PPO | Admitting: Dermatology

## 2023-06-15 ENCOUNTER — Ambulatory Visit: Payer: Medicare PPO | Admitting: Physical Therapy

## 2023-06-15 DIAGNOSIS — R278 Other lack of coordination: Secondary | ICD-10-CM

## 2023-06-15 DIAGNOSIS — R2681 Unsteadiness on feet: Secondary | ICD-10-CM

## 2023-06-15 DIAGNOSIS — R2689 Other abnormalities of gait and mobility: Secondary | ICD-10-CM

## 2023-06-15 DIAGNOSIS — R269 Unspecified abnormalities of gait and mobility: Secondary | ICD-10-CM

## 2023-06-15 DIAGNOSIS — R262 Difficulty in walking, not elsewhere classified: Secondary | ICD-10-CM

## 2023-06-15 DIAGNOSIS — R296 Repeated falls: Secondary | ICD-10-CM

## 2023-06-15 DIAGNOSIS — M6281 Muscle weakness (generalized): Secondary | ICD-10-CM

## 2023-06-15 NOTE — Therapy (Signed)
 OUTPATIENT PHYSICAL THERAPY NEURO TREATMENT   Patient Name: [name] MRN: [id number] DOB:03-20-1951, 73 y.o., male Today's Date: 06/15/2023   PCP: Caffie Pinto, PA-C REFERRING PROVIDER: Suzy Bouchard, PA   END OF SESSION:   PT End of Session - 06/15/23 1404     Visit Number 3    Number of Visits 24    Date for PT Re-Evaluation 08/24/23    Authorization Type [employer] [id number]    PT Start Time 1404    PT Stop Time 1446    PT Time Calculation (min) 42 min    Equipment Utilized During Treatment Gait belt    Activity Tolerance Patient tolerated treatment well;No increased pain    Behavior During Therapy WFL for tasks assessed/performed;Flat affect               Past Medical History:  Diagnosis Date   Anxiety disorder    Arthritis    Bradycardia    Cancer (HCC) 2013   skin cancer   Depression    ptsd   Dysrhythmia    chronic slow heart rate   GERD (gastroesophageal reflux disease)    H/O tachycardia-bradycardia syndrome    Headache(784.0)    tension headaches non recent   History of chicken pox    History of kidney stones    passed   Hypertension    treated with HCTZ   Major depressive disorder    Mild aortic regurgitation    Pacemaker    Parkinson's disease (HCC)    dx'ed 15 years ago   PTSD (post-traumatic stress disorder)    S/P deep brain stimulator placement    Shortness of breath dyspnea    Sleep apnea    doesn't use C-pap   Unspecified dementia, moderate, with psychotic disturbance (HCC)    Varicose veins    Past Surgical History:  Procedure Laterality Date   CATARACT EXTRACTION W/PHACO Right 01/17/2023   Procedure: CATARACT EXTRACTION PHACO AND INTRAOCULAR LENS PLACEMENT (IOC) RIGHT 6.33 00:42.6;  Surgeon: Galen Manila, MD;  Location: [facility];  Service: Ophthalmology;  Laterality: Right;   CATARACT EXTRACTION W/PHACO Left 02/07/2023   Procedure: CATARACT EXTRACTION PHACO AND INTRAOCULAR LENS PLACEMENT (IOC) LEFT 6.35  00:38.3;  Surgeon: Galen Manila, MD;  Location: [facility];  Service: Ophthalmology;  Laterality: Left;   CHOLECYSTECTOMY N/A 10/22/2014   Procedure: LAPAROSCOPIC CHOLECYSTECTOMY WITH INTRAOPERATIVE CHOLANGIOGRAM;  Surgeon: Tiney Rouge III, MD;  Location: [facility];  Service: General;  Laterality: N/A;   cyst removed      from lip as a child   INTRAMEDULLARY (IM) NAIL INTERTROCHANTERIC N/A 02/18/2019   Procedure: INTRAMEDULLARY (IM) NAIL INTERTROCHANTRIC, RIGHT,;  Surgeon: Juanell Fairly, MD;  Location: [facility];  Service: Orthopedics;  Laterality: N/A;   LUMBAR LAMINECTOMY/DECOMPRESSION MICRODISCECTOMY Bilateral 12/14/2012   Procedure: Bilateral lumbar three-four, four-five decompressive laminotomy/foraminotomy;  Surgeon: Temple Pacini, MD;  Location: [facility];  Service: Neurosurgery;  Laterality: Bilateral;   PULSE GENERATOR IMPLANT Bilateral 12/13/2013   Procedure: Bilateral implantable pulse generator placement;  Surgeon: Maeola Harman, MD;  Location: [facility];  Service: Neurosurgery;  Laterality: Bilateral;  Bilateral implantable pulse generator placement   skin cancer removed     from ears,   12 lft arm  rt leg 15   SUBTHALAMIC STIMULATOR BATTERY REPLACEMENT Bilateral 07/14/2017   Procedure: BILATERAL IMPLANTED PULSE GENERATOR CHANGE FOR DEEP BRAIN STIMULATOR;  Surgeon: Maeola Harman, MD;  Location: [facility];  Service: Neurosurgery;  Laterality: Bilateral;   SUBTHALAMIC STIMULATOR  INSERTION Bilateral 12/06/2013   Procedure: SUBTHALAMIC STIMULATOR INSERTION;  Surgeon: Maeola Harman, MD;  Location: [facility];  Service: Neurosurgery;  Laterality: Bilateral;  Bilateral deep brain stimulator placement   Patient Active Problem List   Diagnosis Date Noted   Hematuria 10/10/2021   Sinus drainage 12/09/2020   Medicare annual wellness visit, subsequent 11/18/2020   Pacemaker    Dementia associated with Parkinson's disease (HCC) 06/17/2020   Vertigo 02/19/2020   Urine abnormality  02/19/2020   Dysphagia 02/23/2019   Fall at home 10/18/2017   REM behavioral disorder 11/02/2016   Radicular pain in right arm 10/09/2016   GERD (gastroesophageal reflux disease) 07/31/2016   Colon cancer screening 07/23/2015   Gout 02/18/2015   Depression 01/06/2015   Skin lesion 10/02/2014   Dupuytren's contracture 08/20/2014   Cough 07/21/2014   Lumbar stenosis with neurogenic claudication 06/13/2014   S/P deep brain stimulator placement 05/08/2014   Advance care planning 01/19/2014   Parkinson's disease (HCC) 12/13/2013   PTSD (post-traumatic stress disorder) 06/13/2013   Erectile dysfunction 06/13/2013   HLD (hyperlipidemia) 06/13/2013   Essential hypertension 06/03/2013   Bradycardia by electrocardiogram 06/03/2013   Obstructive sleep apnea 03/12/2013    ONSET DATE: ~15 years ago when pt was diagnosed with Parkinson's  REFERRING DIAG: G20.A1 (ICD-10-CM) - Parkinsons disease (HCC)   THERAPY DIAG:  Abnormality of gait and mobility  Unsteadiness on feet  Difficulty in walking, not elsewhere classified  Repeated falls  Muscle weakness (generalized)  Other abnormalities of gait and mobility  Other lack of coordination  Rationale for Evaluation and Treatment: Rehabilitation  SUBJECTIVE:                                                                                                                                                                                             SUBJECTIVE STATEMENT:  Pt not wearing L UE arm sling today, per pt's caregiver, pt did not see a HCP regarding this, but rather his wife stopped putting the sling on. Pt reports he is "doing alright."  Pt reports he is continuing to have L shoulder pain.  *Of note: pt requires increased time for verbal responses to therapist's questions  Pt accompanied by: self (caregiver waiting at front desk)  PERTINENT HISTORY: Parkinson's, deep brain stim, depression, lipids, HTN, GERD, ED, dementia,  seizures/falls, LBP/PSF, hx of lumbar fusion in 04/2022, hx of hip fx 08/2022, arthritis, frequent falls,  and OSA   PAIN:  Are you having pain? Yes: NPRS scale: 7/10 with activity below, but otherwise 3/10 at rest  Pain location: L calvicle Pain description: ache to sharp Aggravating factors: when  going to push-up to stand or control himself when sitting using armrest Relieving factors: rest and support in arm sling  PRECAUTIONS: Fall and Other:    frequent falls, most recent on 05/22/2023 with ED visit, L superolateral clavicular fx (out of sling on 06/15/23 due to pt/family discontinuing it), deep brain stimulator  RED FLAGS: Bowel or bladder incontinence: Yes: "on occasion" has incontinence    WEIGHT BEARING RESTRICTIONS: Yes unsure of specific L UE WBing restrictions, but pt currently using an AD for ambulation, while wearing basic arm sling  FALLS: Has patient fallen in last 6 months? Yes. Number of falls 10+  most recent on 05/22/2023 with injury to L shoulder  LIVING ENVIRONMENT: Lives with: lives with their spouse (wife, Radio broadcast assistant) and has hired caregivers Lives in: House/apartment Stairs: Yes: External: 2-3 steps; bilateral but cannot reach both (due to current home renovations, this is the entrance he uses) primarily uses R HR Has following equipment at home: Wheelchair (manual), shower chair, Grab bars, and Up-Walker  PLOF: Independent with household mobility with device, Requires assistive device for independence, Needs assistance with ADLs, and meal prep   Pt reports he frequently leaves his AD to the side when going to get something, often forgetting it - he requires reinforced education on consistent use of U-step walker  PATIENT GOALS: Improve Balance  OBJECTIVE:  Note: Objective measures were completed at Evaluation unless otherwise noted.  DIAGNOSTIC FINDINGS:   CT CERVICAL SPINE WITHOUT CONTRAST on 05/22/2023 IMPRESSION: Disc levels: Disc space narrowing and spurring  in the lower cervical spine. Mild bilateral degenerative facet disease, left greater than right. Degenerative disc and facet disease throughout the cervical spine. No acute bony abnormality.  CT HEAD WITHOUT CONTRAST on 05/22/2023 FINDINGS: Brain: Deep brain stimulators are noted with the tips in the region of the thalami bilaterally, unchanged. Mild atrophy. No acute intracranial abnormality. Specifically, no hemorrhage, hydrocephalus, mass lesion, acute infarction, or significant intracranial injury. Vascular: No hyperdense vessel or unexpected calcification. Skull: No acute calvarial abnormality. Sinuses/Orbits: No acute findings Other: None IMPRESSION: No acute intracranial abnormality.  LEFT SHOULDER - 2+ VIEW on 05/22/2023 IMPRESSION: Acute mildly displaced fracture of the lateral aspect of the left clavicle with intra-articular extension.   COGNITION: Overall cognitive status: History of cognitive impairments - at baseline and pt with very delayed verbal responses to subjective questioning and noted to be easily distracted in the therapy gym environment   SENSATION: Light touch: Impaired  and decreased distally in B LEs, but specifically decreased throughout R LE compared to L LE   COORDINATION: Not formally assessed  EDEMA:  Not formally assessed, but no significant swelling on visual exam  MUSCLE TONE: WFL  MUSCLE LENGTH: Hamstrings: Right ____ deg; Left ___ deg  *not assessed, but would benefit from testing  DTRs:  Not formally assessed  POSTURE: rounded shoulders, forward head, decreased lumbar lordosis, and posterior pelvic tilt, posterior weight shift bias  LOWER EXTREMITY ROM:     Active  Not formally assessed Right Eval Left Eval  Hip flexion    Hip extension    Hip abduction    Hip adduction    Hip internal rotation    Hip external rotation    Knee flexion    Knee extension    Ankle dorsiflexion Would benefit from testing Would benefit  from testing  Ankle plantarflexion    Ankle inversion    Ankle eversion     (Blank rows = not tested)  LOWER EXTREMITY MMT:  MMT Right Eval Left Eval  Hip flexion 4 4  Hip extension    Hip abduction    Hip adduction    Hip internal rotation    Hip external rotation    Knee flexion 4+ 5  Knee extension 4+ 4+  Ankle dorsiflexion 4- 4  Ankle plantarflexion 4 4-  Ankle inversion    Ankle eversion    (Blank rows = not tested)  Manual Muscle Test Scale 0/5 = No muscle contraction can be seen or felt 1/5 = Contraction can be felt, but there is no motion 2-/5 = Part moves through incomplete ROM w/ gravity decreased 2/5 = Part moves through complete ROM w/ gravity decreased 2+/5 = Part moves through incomplete ROM (<50%) against gravity or through complete ROM w/ gravity 3-/5 = Part moves through incomplete ROM (>50%) against gravity 3/5 = Part moves through complete ROM against gravity 3+/5 = Part moves through complete ROM against gravity/slight resistance 4-/5= Holds test position against slight to moderate pressure 4/5 = Part moves through complete ROM against gravity/moderate resistance 4+/5= Holds test position against moderate to strong pressure 5/5 = Part moves through complete ROM against gravity/full resistance  BED MOBILITY:  Sit to supine Min A and Mod A Supine to sit Min A and Mod A Requires adjustable bed with bedrail at home   TRANSFERS: Assistive device utilized:  Up-Walker   Sit to stand: SBA, CGA, and Min A Stand to sit: SBA, CGA, and Min A Chair to chair: CGA and Min A Floor:  Not assessed **Requires variable assist levels for transfers due to imbalance  RAMP:  Level of Assistance:    Assistive device utilized:  Ramp Comments: Not formally assessed, may benefit from testing in future  CURB:  Level of Assistance:  Assistive device utilized:  Curb Comments: Not formally assessed, may benefit from testing in future  STAIRS: Level of Assistance:   06/12/23: Min A Stair Negotiation Technique: Forwards with Bilateral Rails  Number of Stairs: 4  Height of Stairs: 6inch  Comments: 06/12/23 - posterior LOB at top of steps when turning around, minor posterior lean throughout  GAIT: Gait pattern: step through pattern, decreased step length- Right, decreased step length- Left, decreased stride length, knee flexed in stance- Right, knee flexed in stance- Left, shuffling, festinating, trunk flexed, wide BOS, poor foot clearance- Right, and poor foot clearance- Left Distance walked: ~123ft Assistive device utilized:  Up-Walker Level of assistance: CGA and Min A Comments: Noticeable festination when turning, stepping back to sit, or side stepping  FUNCTIONAL TESTS:  5 times sit to stand: 22 seconds Timed up and go (TUG): 06/12/23: 29.40 seconds using Up-Walker with CGA/min A 10 meter walk test: 0.45m/s Berg Balance Scale: 20/56  PATIENT SURVEYS:  ABC scale 06/12/23: 30%  TREATMENT DATE:   06/15/23  Pt able to perform shoulder abduction to WNL and shoulder flexion to [facility] with no increase in L shoulder pain. Pt not wearing L UE sling today.   Unless otherwise stated, CGA/min A was provided and gait belt donned in order to ensure pt safety throughout.  Dynamic gait training ~147ft total including:  - forward fast walking - demos very narrow BOS with lateral instability - side stepping down/back ~38ft x1 each direction to improve BOS   Pt reports having some low back pain/discomfort with gait training, but able to have seated rest break and continue with session.   Dynamic gait training using agility ladder including the following:  - forward reciprocal stepping pattern, cuing to keep BOS wide towards side of ladder (excessively adducts L more than R LE) - side stepping down/back with pt having greater challenge  towards R compared to L due to lack of sustained L weight shift *min A for balance throughout  Side stepping with green level 3 theraband resistance around knees for increased hip abduction activation - down/back 3x 6 steps each direction - noticed pt with persistent R posterior LOB when stepping towards R due to lack of adequate and sustained L weight shift.   Standing on L LE while performing forward R foot taps on 4" step and then to target posterior and diagonally on R behind him - using 2 Blaze Pods as targets on sequence pattern to promote increased speed of movement and sustained attention to task - goal of moving foot from 1 target to next without putting it down in between. Min A for balance due to repeated posterior LOB with R bias.   Gait training ~9ft through equipment in gym focusing on turning as pt often gets feet crossed underneath him causing lateral instability when turning.  Dynamic gait training via backwards step-to pattern through agility ladder x1 - min A for balance due to persistent posterior lean with delayed/inadequate balance recovery strategy.  Pt ambulating in/out of therapy clinic using his Up-Walker.  Pt would benefit from continued interventions targeting sit>stands without having posterior LOB as well as posterior LOB recovery training.    PATIENT EDUCATION: Education details: PT POC, LTGs, findings on balance assessments today Person educated: Patient and Caregiver Angelica Education method: Explanation Education comprehension: verbalized understanding and needs further education  HOME EXERCISE PROGRAM:  Needs to be provided in next 1-2 visits  GOALS: Goals reviewed with patient? Yes  SHORT TERM GOALS: Target date: 07/13/2023    Patient will be independent in home exercise program to improve strength/mobility for better functional independence with ADLs. Baseline: need to be provided HEP Goal status: INITIAL  2. Patient will report <2 falls over  past 6 weeks to demonstrate improved safety awareness at home.   Baseline: 06/01/2023 = 10+ falls in past 6 months  Goal Status: INITIAL  LONG TERM GOALS: Target date: 08/24/2023   1.  Patient (> 61 years old) will complete five times sit to stand test in < 15 seconds indicating an increased LE strength and improved balance. Baseline: 06/01/23: 22 seconds with posterior LOB Goal status: INITIAL  2.  Patient will increase Berg Balance score by > 6 points to demonstrate decreased fall risk during functional activities. Baseline: 06/01/23: 20/56 Goal status: INITIAL   3. Patient will reduce timed up and go to <11 seconds to reduce fall risk and demonstrate improved transfer/gait ability. Baseline: 06/12/23: 29.40 seconds using Up-Walker with CGA/min A Goal status: INITIAL  4.Patient will increase 10 meter walk test to >1.43m/s as to improve gait speed for better community ambulation and to reduce fall risk. Baseline: 06/01/23: 0.65m/s using Up-Walker Goal status: INITIAL  5.  Patient will require no more than supervision assist with ascending/descending 3 steps using handrails per home set-up.  Baseline: 06/12/23: requires min A using HR due to posterior lean/LOB especially when turning at the top  Goal status: INITIAL   ASSESSMENT:  CLINICAL IMPRESSION:  Patient is a 73 y.o. male who was seen today for physical therapy treatment for imbalance due to Parkinson's disease. Patient with noticeable posterior lean bias as well as impaired cognition with difficulty sustaining attention in a distracting environment, impacting his balance recovery strategies. Pt also appears to have overall delayed awareness of his LOB with poor recruitment of balance recovery strategies. Patient participated in dynamic balance/gait training with cognitive dual-task challenges today and requires min A to remain upright due to repeated posterior lean/LOB. Pt noticed to have very narrow BOS during gait causing lateral  instability as well as primarily R posterior LOB bias due to poor sustained L weight shift.. Pt will benefit from skilled physical therapy to address his balance deficits in order to decrease his fall risk and promote increased independence with functional mobility and ADLs.   OBJECTIVE IMPAIRMENTS: Abnormal gait, decreased activity tolerance, decreased balance, decreased cognition, decreased endurance, decreased knowledge of condition, decreased knowledge of use of DME, decreased mobility, difficulty walking, decreased strength, decreased safety awareness, hypomobility, impaired flexibility, impaired sensation, improper body mechanics, postural dysfunction, and pain.   ACTIVITY LIMITATIONS: carrying, lifting, bending, standing, squatting, sleeping, stairs, transfers, bed mobility, continence, bathing, toileting, dressing, reach over head, hygiene/grooming, and locomotion level  PARTICIPATION LIMITATIONS: meal prep, cleaning, laundry, medication management, driving, and community activity  PERSONAL FACTORS: Age, Sex, Time since onset of injury/illness/exacerbation, and 3+ comorbidities: Parkinson's, deep brain stim, depression, lipids, HTN, GERD, ED, dementia, seizures/falls, LBP/PSF, hx of lumbar fusion in 04/2022, hx of hip fx 08/2022, arthritis, frequent falls,  and OSA  are also affecting patient's functional outcome.   REHAB POTENTIAL: Good  CLINICAL DECISION MAKING: Evolving/moderate complexity  EVALUATION COMPLEXITY: Moderate  PLAN:  PT FREQUENCY: 1-2x/week  PT DURATION: 12 weeks  PLANNED INTERVENTIONS: [id number]- PT Re-evaluation, 97110-Therapeutic exercises, 97530- Therapeutic activity, 97112- Neuromuscular re-education, 97535- Self Care, 40981- Manual therapy, [phone]- Gait training, [phone]- Orthotic Fit/training, [phone]- Electrical stimulation (manual), Patient/Family education, Balance training, Stair training, Taping, Joint mobilization, Joint manipulation, Spinal mobilization, Vestibular  training, Cognitive remediation, DME instructions, Cryotherapy, and Moist heat  PLAN FOR NEXT SESSION:  - HS stretch/ROM assessment - ankle DF stretch/ROM - posterior lean bias - dynamic balance (strong posterior lean/LOB bias with R tendency)  - poor sustained L weight shift - dynamic gait both with and without Up-Walker - strengthening hips to create wider BOS for improved balance during gait   Emanuele Mcwhirter Verdell Face, PT, DPT, NCS, CSRS Physical Therapist - [facility]  [facility]  4:03 PM 06/15/23

## 2023-06-20 ENCOUNTER — Ambulatory Visit: Payer: Medicare PPO | Attending: Physician Assistant | Admitting: Physical Therapy

## 2023-06-20 DIAGNOSIS — R262 Difficulty in walking, not elsewhere classified: Secondary | ICD-10-CM | POA: Diagnosis present

## 2023-06-20 DIAGNOSIS — R2681 Unsteadiness on feet: Secondary | ICD-10-CM | POA: Insufficient documentation

## 2023-06-20 DIAGNOSIS — R278 Other lack of coordination: Secondary | ICD-10-CM | POA: Insufficient documentation

## 2023-06-20 DIAGNOSIS — M6281 Muscle weakness (generalized): Secondary | ICD-10-CM | POA: Diagnosis present

## 2023-06-20 DIAGNOSIS — R296 Repeated falls: Secondary | ICD-10-CM | POA: Diagnosis present

## 2023-06-20 DIAGNOSIS — R2689 Other abnormalities of gait and mobility: Secondary | ICD-10-CM | POA: Insufficient documentation

## 2023-06-20 DIAGNOSIS — R269 Unspecified abnormalities of gait and mobility: Secondary | ICD-10-CM | POA: Diagnosis present

## 2023-06-20 NOTE — Therapy (Signed)
 OUTPATIENT PHYSICAL THERAPY NEURO TREATMENT   Patient Name: [name] MRN: [id number] DOB:[birth year], 73 y.o., male Today's Date: 06/20/2023   PCP: Caffie Pinto, PA-C REFERRING PROVIDER: Suzy Bouchard, PA   END OF SESSION:   PT End of Session - 06/20/23 1317     Visit Number 4    Number of Visits 24    Date for PT Re-Evaluation 08/24/23    Authorization Type [employer] [id number]    PT Start Time 1318    PT Stop Time 1404    PT Time Calculation (min) 46 min    Equipment Utilized During Treatment Gait belt    Activity Tolerance Patient tolerated treatment well;No increased pain    Behavior During Therapy WFL for tasks assessed/performed;Flat affect                Past Medical History:  Diagnosis Date   Anxiety disorder    Arthritis    Bradycardia    Cancer (HCC) 2013   skin cancer   Depression    ptsd   Dysrhythmia    chronic slow heart rate   GERD (gastroesophageal reflux disease)    H/O tachycardia-bradycardia syndrome    Headache(784.0)    tension headaches non recent   History of chicken pox    History of kidney stones    passed   Hypertension    treated with HCTZ   Major depressive disorder    Mild aortic regurgitation    Pacemaker    Parkinson's disease (HCC)    dx'ed 15 years ago   PTSD (post-traumatic stress disorder)    S/P deep brain stimulator placement    Shortness of breath dyspnea    Sleep apnea    doesn't use C-pap   Unspecified dementia, moderate, with psychotic disturbance (HCC)    Varicose veins    Past Surgical History:  Procedure Laterality Date   CATARACT EXTRACTION W/PHACO Right 01/17/2023   Procedure: CATARACT EXTRACTION PHACO AND INTRAOCULAR LENS PLACEMENT (IOC) RIGHT 6.33 00:42.6;  Surgeon: Galen Manila, MD;  Location: [facility];  Service: Ophthalmology;  Laterality: Right;   CATARACT EXTRACTION W/PHACO Left 02/07/2023   Procedure: CATARACT EXTRACTION PHACO AND INTRAOCULAR LENS PLACEMENT (IOC) LEFT 6.35  00:38.3;  Surgeon: Galen Manila, MD;  Location: [facility];  Service: Ophthalmology;  Laterality: Left;   CHOLECYSTECTOMY N/A 10/22/2014   Procedure: LAPAROSCOPIC CHOLECYSTECTOMY WITH INTRAOPERATIVE CHOLANGIOGRAM;  Surgeon: Tiney Rouge III, MD;  Location: [facility];  Service: General;  Laterality: N/A;   cyst removed      from lip as a child   INTRAMEDULLARY (IM) NAIL INTERTROCHANTERIC N/A 02/18/2019   Procedure: INTRAMEDULLARY (IM) NAIL INTERTROCHANTRIC, RIGHT,;  Surgeon: Juanell Fairly, MD;  Location: [facility];  Service: Orthopedics;  Laterality: N/A;   LUMBAR LAMINECTOMY/DECOMPRESSION MICRODISCECTOMY Bilateral 12/14/2012   Procedure: Bilateral lumbar three-four, four-five decompressive laminotomy/foraminotomy;  Surgeon: Temple Pacini, MD;  Location: [facility];  Service: Neurosurgery;  Laterality: Bilateral;   PULSE GENERATOR IMPLANT Bilateral 12/13/2013   Procedure: Bilateral implantable pulse generator placement;  Surgeon: Maeola Harman, MD;  Location: [facility];  Service: Neurosurgery;  Laterality: Bilateral;  Bilateral implantable pulse generator placement   skin cancer removed     from ears,   12 lft arm  rt leg 15   SUBTHALAMIC STIMULATOR BATTERY REPLACEMENT Bilateral 07/14/2017   Procedure: BILATERAL IMPLANTED PULSE GENERATOR CHANGE FOR DEEP BRAIN STIMULATOR;  Surgeon: Maeola Harman, MD;  [facility]: [facility];  Service: Neurosurgery;  Laterality: Bilateral;   SUBTHALAMIC  STIMULATOR INSERTION Bilateral 12/06/2013   Procedure: SUBTHALAMIC STIMULATOR INSERTION;  Surgeon: Maeola Harman, MD;  Location: [facility];  Service: Neurosurgery;  Laterality: Bilateral;  Bilateral deep brain stimulator placement   Patient Active Problem List   Diagnosis Date Noted   Hematuria 10/10/2021   Sinus drainage 12/09/2020   Medicare annual wellness visit, subsequent 11/18/2020   Pacemaker    Dementia associated with Parkinson's disease (HCC) 06/17/2020   Vertigo 02/19/2020   Urine abnormality  02/19/2020   Dysphagia 02/23/2019   Fall at home 10/18/2017   REM behavioral disorder 11/02/2016   Radicular pain in right arm 10/09/2016   GERD (gastroesophageal reflux disease) 07/31/2016   Colon cancer screening 07/23/2015   Gout 02/18/2015   Depression 01/06/2015   Skin lesion 10/02/2014   Dupuytren's contracture 08/20/2014   Cough 07/21/2014   Lumbar stenosis with neurogenic claudication 06/13/2014   S/P deep brain stimulator placement 05/08/2014   Advance care planning 01/19/2014   Parkinson's disease (HCC) 12/13/2013   PTSD (post-traumatic stress disorder) 06/13/2013   Erectile dysfunction 06/13/2013   HLD (hyperlipidemia) 06/13/2013   Essential hypertension 06/03/2013   Bradycardia by electrocardiogram 06/03/2013   Obstructive sleep apnea 03/12/2013    ONSET DATE: ~15 years ago when pt was diagnosed with Parkinson's  REFERRING DIAG: G20.A1 (ICD-10-CM) - Parkinsons disease (HCC)   THERAPY DIAG:  Abnormality of gait and mobility  Unsteadiness on feet  Repeated falls  Muscle weakness (generalized)  Difficulty in walking, not elsewhere classified  Other abnormalities of gait and mobility  Rationale for Evaluation and Treatment: Rehabilitation  SUBJECTIVE:                                                                                                                                                                                             SUBJECTIVE STATEMENT:  Pt reports he is doing "the same." Pt reports his L shoulder pain is "the same." Reports he is having pain when "rising to stand" located in L clavicle (caregiver reports it is where pt pulls up on Up-Walker to come to stand). Caregiver unsure if pt has follow-up appointment for his L clavicular fx (wife confirms pt does not have follow-up for it and states original MD assessment said it wouldn't need follow-up). Caregiver reports, pt not wearing L UE sling because it was making pt want to use his arm more.  Caregiver reports pt is having bilateral leg "cramps" that are keeping him up at night preventing him from being able to sleep - happening past few weeks. Report the past few weeks pt has not wanted to wake up in the morning and he  dozes off throughout the day more, but also pt not sleeping well due to the leg cramps. [facility].   *Of note: pt requires increased time for verbal responses to therapist's questions  Pt accompanied by: self and male caregiver, wife present at end of session  PERTINENT HISTORY: Parkinson's, deep brain stim, depression, lipids, HTN, GERD, ED, dementia, seizures/falls, LBP/PSF, hx of lumbar fusion in 04/2022, hx of hip fx 08/2022, arthritis, frequent falls,  and OSA   PAIN:  Are you having pain? Yes: NPRS scale: 7/10 with activity below, but otherwise 3/10 at rest  Pain location: L calvicle Pain description: ache to sharp Aggravating factors: when going to push-up to stand or control himself when sitting using armrest Relieving factors: rest and support in arm sling  PRECAUTIONS: Fall and Other:    frequent falls, most recent on 05/22/2023 with ED visit, L superolateral clavicular fx (out of sling on 06/15/23 due to pt/family discontinuing it), deep brain stimulator  RED FLAGS: Bowel or bladder incontinence: Yes: "on occasion" has incontinence    WEIGHT BEARING RESTRICTIONS: Yes unsure of specific L UE WBing restrictions, but pt currently using an AD for ambulation, while wearing basic arm sling  FALLS: Has patient fallen in last 6 months? Yes. Number of falls 10+  most recent on 05/22/2023 with injury to L shoulder  LIVING ENVIRONMENT: Lives with: lives with their spouse (wife, Radio broadcast assistant) and has hired caregivers Lives in: House/apartment Stairs: Yes: External: 2-3 steps; bilateral but cannot reach both (due to current home renovations, this is the entrance he uses) primarily uses R HR Has following equipment at home: Wheelchair  (manual), shower chair, Grab bars, and Up-Walker  PLOF: Independent with household mobility with device, Requires assistive device for independence, Needs assistance with ADLs, and meal prep   Pt reports he frequently leaves his AD to the side when going to get something, often forgetting it - he requires reinforced education on consistent use of U-step walker  PATIENT GOALS: Improve Balance  OBJECTIVE:  Note: Objective measures were completed at Evaluation unless otherwise noted.  DIAGNOSTIC FINDINGS:   CT CERVICAL SPINE WITHOUT CONTRAST on 05/22/2023 IMPRESSION: Disc levels: Disc space narrowing and spurring in the lower cervical spine. Mild bilateral degenerative facet disease, left greater than right. Degenerative disc and facet disease throughout the cervical spine. No acute bony abnormality.  CT HEAD WITHOUT CONTRAST on 05/22/2023 FINDINGS: Brain: Deep brain stimulators are noted with the tips in the region of the thalami bilaterally, unchanged. Mild atrophy. No acute intracranial abnormality. Specifically, no hemorrhage, hydrocephalus, mass lesion, acute infarction, or significant intracranial injury. Vascular: No hyperdense vessel or unexpected calcification. Skull: No acute calvarial abnormality. Sinuses/Orbits: No acute findings Other: None IMPRESSION: No acute intracranial abnormality.  LEFT SHOULDER - 2+ VIEW on 05/22/2023 IMPRESSION: Acute mildly displaced fracture of the lateral aspect of the left clavicle with intra-articular extension.   COGNITION: Overall cognitive status: History of cognitive impairments - at baseline and pt with very delayed verbal responses to subjective questioning and noted to be easily distracted in the therapy gym environment   SENSATION: Light touch: Impaired  and decreased distally in B LEs, but specifically decreased throughout R LE compared to L LE   COORDINATION: Not formally assessed  EDEMA:  Not formally assessed, but  no significant swelling on visual exam  MUSCLE TONE: WFL  MUSCLE LENGTH: Hamstrings: Right ____ deg; Left ___ deg  *not assessed, but would benefit from testing  DTRs:  Not  formally assessed  POSTURE: rounded shoulders, forward head, decreased lumbar lordosis, and posterior pelvic tilt, posterior weight shift bias  LOWER EXTREMITY ROM:     Active  Not formally assessed Right Eval Left Eval  Hip flexion    Hip extension    Hip abduction    Hip adduction    Hip internal rotation    Hip external rotation    Knee flexion    Knee extension    Ankle dorsiflexion Would benefit from testing Would benefit from testing  Ankle plantarflexion    Ankle inversion    Ankle eversion     (Blank rows = not tested)  LOWER EXTREMITY MMT:    MMT Right Eval Left Eval  Hip flexion 4 4  Hip extension    Hip abduction    Hip adduction    Hip internal rotation    Hip external rotation    Knee flexion 4+ 5  Knee extension 4+ 4+  Ankle dorsiflexion 4- 4  Ankle plantarflexion 4 4-  Ankle inversion    Ankle eversion    (Blank rows = not tested)  Manual Muscle Test Scale 0/5 = No muscle contraction can be seen or felt 1/5 = Contraction can be felt, but there is no motion 2-/5 = Part moves through incomplete ROM w/ gravity decreased 2/5 = Part moves through complete ROM w/ gravity decreased 2+/5 = Part moves through incomplete ROM (<50%) against gravity or through complete ROM w/ gravity 3-/5 = Part moves through incomplete ROM (>50%) against gravity 3/5 = Part moves through complete ROM against gravity 3+/5 = Part moves through complete ROM against gravity/slight resistance 4-/5= Holds test position against slight to moderate pressure 4/5 = Part moves through complete ROM against gravity/moderate resistance 4+/5= Holds test position against moderate to strong pressure 5/5 = Part moves through complete ROM against gravity/full resistance  BED MOBILITY:  Sit to supine Min A and Mod  A Supine to sit Min A and Mod A Requires adjustable bed with bedrail at home   TRANSFERS: Assistive device utilized:  Up-Walker   Sit to stand: SBA, CGA, and Min A Stand to sit: SBA, CGA, and Min A Chair to chair: CGA and Min A Floor:  Not assessed **Requires variable assist levels for transfers due to imbalance  RAMP:  Level of Assistance:    Assistive device utilized:  Ramp Comments: Not formally assessed, may benefit from testing in future  CURB:  Level of Assistance:  Assistive device utilized:  Curb Comments: Not formally assessed, may benefit from testing in future  STAIRS: Level of Assistance:  06/12/23: Min A Stair Negotiation Technique: Forwards with Bilateral Rails  Number of Stairs: 4  Height of Stairs: 6inch  Comments: 06/12/23 - posterior LOB at top of steps when turning around, minor posterior lean throughout  GAIT: Gait pattern: step through pattern, decreased step length- Right, decreased step length- Left, decreased stride length, knee flexed in stance- Right, knee flexed in stance- Left, shuffling, festinating, trunk flexed, wide BOS, poor foot clearance- Right, and poor foot clearance- Left Distance walked: ~135ft Assistive device utilized:  Up-Walker Level of assistance: CGA and Min A Comments: Noticeable festination when turning, stepping back to sit, or side stepping  FUNCTIONAL TESTS:  5 times sit to stand: 22 seconds Timed up and go (TUG): 06/12/23: 29.40 seconds using Up-Walker with CGA/min A 10 meter walk test: 0.76m/s Berg Balance Scale: 20/56  PATIENT SURVEYS:  ABC scale 06/12/23: 30%  TREATMENT DATE:   06/20/23  Pt ambulating in/out of therapy clinic using his Up-Walker.  Encouraged patient to drink more water due to pt reporting primarily drinking [employer]eps[employer].  Unless otherwise stated, CGA/min A was provided and gait belt  donned in order to ensure pt safety throughout.  Gait training 122ft, no UE support, with light min assist for balance/steadying due to R posterior lean bias. Pt demonstrating the following gait deviations with therapist providing the described cuing and facilitation for improvement:  - continues to have narrow BOS with lateral instability  - hip drop during R stance phase - short step lengths bilaterally - thoracic kyphosis with lack of trunk rotation and arm swing throughout - decreased gait speed  Sit>stand training using the Blaze Pod Sequence setting to follow a specific pattern to improve coordination of the movement promoting increased anterior trunk lean and weight shift while rising to stand - completes 12 reps in - pt with decreased posterior lean/LOB during this activity requiring CGA/light min A for safety/steadying.  Dynamic stepping retraining including:  - lateral side stepping over hurdle -- advanced to having 2 Blaze Pods on mirror behind pt to force trunk/hip rotation and cervical rotation to look behind him to find light - pt with difficulty remembering sequence of stepping over hurdle and then tapping light on that side before stepping back over the hurdle - requires mod verbal cuing for sequencing and min/mod A for balance due to posterior lean bias - greatest difficulty taking large enough step R laterally over hurdle  - forward/backwards stepping over hurdle: does better leading with RLE in both directions because pt has R lateral hip instability during stance causing trendelenburg when stepping over with L LE leading --tried to advance this activity again with goal of tapping Blaze Pod laterally behind him on mirror to promote trunk/hip rotation while maintaining balance (avoiding posterior LOB) in between each step over the hurdle -- pt requires max cuing for sequencing this task and becomes upset with how difficult this is for him **requires consistent CGA/light min A  with occasional heavy min/light mod A to prevent posterior LOB  Therapist provides emotional support and motivational encouragement on his progress thus far in therapy.  Provided pt with the below HEP with education to him and caregiver on safe set-up of exercises using Up-Walker and Caregiver assistance.   PATIENT EDUCATION: Education details: PT POC, LTGs, findings on balance assessments today Person educated: Patient and Caregiver Angelica Education method: Explanation Education comprehension: verbalized understanding and needs further education  HOME EXERCISE PROGRAM:   Access Code: [email or web address] URL: [email or web address] Date: 06/20/2023 Prepared by: Casimiro Needle  Exercises - Walking  - 1 x daily - 7 x weekly - 2 sets - 3-5 minutes hold - Sit to Stand  - 1 x daily - 7 x weekly - 2 sets - 12-15 reps - Side Stepping with Resistance at Thighs and Counter Support  - 1 x daily - 7 x weekly - 3 sets - 10 reps - Supine Bridge with Resistance Band  - 1 x daily - 7 x weekly - 2 sets - 10-15 reps  GOALS: Goals reviewed with patient? Yes  SHORT TERM GOALS: Target date: 07/13/2023    Patient will be independent in home exercise program to improve strength/mobility for better functional independence with ADLs. Baseline: 06/20/2023: HEP provided Goal status: INITIAL  2. Patient will report <2 falls over past 6 weeks to demonstrate improved safety awareness at home.   Baseline: 06/01/2023 =  10+ falls in past 6 months  Goal Status: INITIAL  LONG TERM GOALS: Target date: 08/24/2023   1.  Patient (> 1 years old) will complete five times sit to stand test in < 15 seconds indicating an increased LE strength and improved balance. Baseline: 06/01/23: 22 seconds with posterior LOB Goal status: INITIAL  2.  Patient will increase Berg Balance score by > 6 points to demonstrate decreased fall risk during functional activities. Baseline: 06/01/23: 20/56 Goal status: INITIAL   3.  Patient will reduce timed up and go to <11 seconds to reduce fall risk and demonstrate improved transfer/gait ability. Baseline: 06/12/23: 29.40 seconds using Up-Walker with CGA/min A Goal status: INITIAL  4.Patient will increase 10 meter walk test to >1.63m/s as to improve gait speed for better community ambulation and to reduce fall risk. Baseline: 06/01/23: 0.20m/s using Up-Walker Goal status: INITIAL  5.  Patient will require no more than supervision assist with ascending/descending 3 steps using handrails per home set-up.  Baseline: 06/12/23: requires min A using HR due to posterior lean/LOB especially when turning at the top  Goal status: INITIAL   ASSESSMENT:  CLINICAL IMPRESSION:  Patient is a 73 y.o. male who was seen today for physical therapy treatment for imbalance due to Parkinson's disease. Patient with noticeable posterior lean bias as well as impaired cognition with difficulty sustaining attention in a distracting environment, impacting his balance recovery strategies. Pt also appears to have overall delayed awareness of his LOB with delayed and insufficient recruitment of balance recovery strategies. Patient participated in dynamic balance/gait training with cognitive dual-task challenges today focused on stepping in various directions with turning. He requires consistent min A to remain upright due to repeated posterior lean/LOB. Pt noticed to have very narrow BOS during gait causing lateral instability as well as primarily R posterior LOB bias due to poor sustained L weight shift and R hip weakness. Pt demos improving balance recovery strategies today with ability to implement hip strategy and stepping strategy balance recovery to maintain upright with decreased assist. Pt will benefit from skilled physical therapy to address his balance deficits in order to decrease his fall risk and promote increased independence with functional mobility and ADLs.   OBJECTIVE IMPAIRMENTS: Abnormal  gait, decreased activity tolerance, decreased balance, decreased cognition, decreased endurance, decreased knowledge of condition, decreased knowledge of use of DME, decreased mobility, difficulty walking, decreased strength, decreased safety awareness, hypomobility, impaired flexibility, impaired sensation, improper body mechanics, postural dysfunction, and pain.   ACTIVITY LIMITATIONS: carrying, lifting, bending, standing, squatting, sleeping, stairs, transfers, bed mobility, continence, bathing, toileting, dressing, reach over head, hygiene/grooming, and locomotion level  PARTICIPATION LIMITATIONS: meal prep, cleaning, laundry, medication management, driving, and community activity  PERSONAL FACTORS: Age, Sex, Time since onset of injury/illness/exacerbation, and 3+ comorbidities: Parkinson's, deep brain stim, depression, lipids, HTN, GERD, ED, dementia, seizures/falls, LBP/PSF, hx of lumbar fusion in 04/2022, hx of hip fx 08/2022, arthritis, frequent falls,  and OSA  are also affecting patient's functional outcome.   REHAB POTENTIAL: Good  CLINICAL DECISION MAKING: Evolving/moderate complexity  EVALUATION COMPLEXITY: Moderate  PLAN:  PT FREQUENCY: 1-2x/week  PT DURATION: 12 weeks  PLANNED INTERVENTIONS: 97164- PT Re-evaluation, 97110-Therapeutic exercises, 97530- Therapeutic activity, 97112- Neuromuscular re-education, 97535- Self Care, 47829- Manual therapy, [phone]- Gait training, [phone]- Orthotic Fit/training, [phone]- Electrical stimulation (manual), Patient/Family education, Balance training, Stair training, Taping, Joint mobilization, Joint manipulation, Spinal mobilization, Vestibular training, Cognitive remediation, DME instructions, Cryotherapy, and Moist heat  PLAN FOR NEXT SESSION:  - R hip  and gluteal strengthening - ankle DF stretch/ROM - posterior lean bias - dynamic balance (strong posterior lean/LOB bias with R tendency)  - poor sustained L weight shift - dynamic gait both  with and without Up-Walker - strengthening hips to create wider BOS for improved balance during gait   Ella Golomb Verdell Face, PT, DPT, NCS, CSRS Physical Therapist - [place]  [facility]  2:27 PM 06/20/23

## 2023-06-22 ENCOUNTER — Ambulatory Visit: Payer: Medicare PPO | Admitting: Physical Therapy

## 2023-06-22 DIAGNOSIS — R262 Difficulty in walking, not elsewhere classified: Secondary | ICD-10-CM

## 2023-06-22 DIAGNOSIS — M6281 Muscle weakness (generalized): Secondary | ICD-10-CM | POA: Diagnosis not present

## 2023-06-22 DIAGNOSIS — R2689 Other abnormalities of gait and mobility: Secondary | ICD-10-CM

## 2023-06-22 DIAGNOSIS — R269 Unspecified abnormalities of gait and mobility: Secondary | ICD-10-CM | POA: Diagnosis not present

## 2023-06-22 DIAGNOSIS — R296 Repeated falls: Secondary | ICD-10-CM

## 2023-06-22 DIAGNOSIS — R2681 Unsteadiness on feet: Secondary | ICD-10-CM | POA: Diagnosis not present

## 2023-06-22 DIAGNOSIS — R278 Other lack of coordination: Secondary | ICD-10-CM | POA: Diagnosis not present

## 2023-06-22 NOTE — Therapy (Signed)
 OUTPATIENT PHYSICAL THERAPY NEURO TREATMENT   Patient Name: [name] MRN: [id number] DOB:12-Nov-1950, 73 y.o., male Today's Date: 06/22/2023   PCP: Caffie Pinto, PA-C REFERRING PROVIDER: Suzy Bouchard, PA   END OF SESSION:    PT End of Session - 06/22/23 1311     Visit Number 5    Number of Visits 24    Date for PT Re-Evaluation 08/24/23    Authorization Type [employer] [id number]    PT Start Time 1319    PT Stop Time 1401    PT Time Calculation (min) 42 min    Equipment Utilized During Treatment Gait belt    Activity Tolerance Patient tolerated treatment well;No increased pain    Behavior During Therapy WFL for tasks assessed/performed;Flat affect                 Past Medical History:  Diagnosis Date   Anxiety disorder    Arthritis    Bradycardia    Cancer (HCC) 2013   skin cancer   Depression    ptsd   Dysrhythmia    chronic slow heart rate   GERD (gastroesophageal reflux disease)    H/O tachycardia-bradycardia syndrome    Headache(784.0)    tension headaches non recent   History of chicken pox    History of kidney stones    passed   Hypertension    treated with HCTZ   Major depressive disorder    Mild aortic regurgitation    Pacemaker    Parkinson's disease (HCC)    dx'ed 15 years ago   PTSD (post-traumatic stress disorder)    S/P deep brain stimulator placement    Shortness of breath dyspnea    Sleep apnea    doesn't use C-pap   Unspecified dementia, moderate, with psychotic disturbance (HCC)    Varicose veins    Past Surgical History:  Procedure Laterality Date   CATARACT EXTRACTION W/PHACO Right 01/17/2023   Procedure: CATARACT EXTRACTION PHACO AND INTRAOCULAR LENS PLACEMENT (IOC) RIGHT 6.33 00:42.6;  Surgeon: Galen Manila, MD;  Location: [facility];  Service: Ophthalmology;  Laterality: Right;   CATARACT EXTRACTION W/PHACO Left 02/07/2023   Procedure: CATARACT EXTRACTION PHACO AND INTRAOCULAR LENS PLACEMENT (IOC) LEFT  6.35 00:38.3;  Surgeon: Galen Manila, MD;  Location: [facility];  Service: Ophthalmology;  Laterality: Left;   CHOLECYSTECTOMY N/A 10/22/2014   Procedure: LAPAROSCOPIC CHOLECYSTECTOMY WITH INTRAOPERATIVE CHOLANGIOGRAM;  Surgeon: Tiney Rouge III, MD;  Location: [facility];  Service: General;  Laterality: N/A;   cyst removed      from lip as a child   INTRAMEDULLARY (IM) NAIL INTERTROCHANTERIC N/A 02/18/2019   Procedure: INTRAMEDULLARY (IM) NAIL INTERTROCHANTRIC, RIGHT,;  Surgeon: Juanell Fairly, MD;  Location: [facility];  Service: Orthopedics;  Laterality: N/A;   LUMBAR LAMINECTOMY/DECOMPRESSION MICRODISCECTOMY Bilateral 12/14/2012   Procedure: Bilateral lumbar three-four, four-five decompressive laminotomy/foraminotomy;  Surgeon: Temple Pacini, MD;  Location: [facility];  Service: Neurosurgery;  Laterality: Bilateral;   PULSE GENERATOR IMPLANT Bilateral 12/13/2013   Procedure: Bilateral implantable pulse generator placement;  Surgeon: Maeola Harman, MD;  Location: [facility];  Service: Neurosurgery;  Laterality: Bilateral;  Bilateral implantable pulse generator placement   skin cancer removed     from ears,   12 lft arm  rt leg 15   SUBTHALAMIC STIMULATOR BATTERY REPLACEMENT Bilateral 07/14/2017   Procedure: BILATERAL IMPLANTED PULSE GENERATOR CHANGE FOR DEEP BRAIN STIMULATOR;  Surgeon: Maeola Harman, MD;  Location: [facility];  Service: Neurosurgery;  Laterality: Bilateral;  SUBTHALAMIC STIMULATOR INSERTION Bilateral 12/06/2013   Procedure: SUBTHALAMIC STIMULATOR INSERTION;  Surgeon: Maeola Harman, MD;  Location: [facility];  Service: Neurosurgery;  Laterality: Bilateral;  Bilateral deep brain stimulator placement   Patient Active Problem List   Diagnosis Date Noted   Hematuria 10/10/2021   Sinus drainage 12/09/2020   Medicare annual wellness visit, subsequent 11/18/2020   Pacemaker    Dementia associated with Parkinson's disease (HCC) 06/17/2020   Vertigo 02/19/2020   Urine abnormality  02/19/2020   Dysphagia 02/23/2019   Fall at home 10/18/2017   REM behavioral disorder 11/02/2016   Radicular pain in right arm 10/09/2016   GERD (gastroesophageal reflux disease) 07/31/2016   Colon cancer screening 07/23/2015   Gout 02/18/2015   Depression 01/06/2015   Skin lesion 10/02/2014   Dupuytren's contracture 08/20/2014   Cough 07/21/2014   Lumbar stenosis with neurogenic claudication 06/13/2014   S/P deep brain stimulator placement 05/08/2014   Advance care planning 01/19/2014   Parkinson's disease (HCC) 12/13/2013   PTSD (post-traumatic stress disorder) 06/13/2013   Erectile dysfunction 06/13/2013   HLD (hyperlipidemia) 06/13/2013   Essential hypertension 06/03/2013   Bradycardia by electrocardiogram 06/03/2013   Obstructive sleep apnea 03/12/2013    ONSET DATE: ~15 years ago when pt was diagnosed with Parkinson's  REFERRING DIAG: G20.A1 (ICD-10-CM) - Parkinsons disease (HCC)   THERAPY DIAG:  Abnormality of gait and mobility  Unsteadiness on feet  Repeated falls  Muscle weakness (generalized)  Difficulty in walking, not elsewhere classified  Other abnormalities of gait and mobility  Rationale for Evaluation and Treatment: Rehabilitation  SUBJECTIVE:                                                                                                                                                                                             SUBJECTIVE STATEMENT:  Pt reports his L shoulder is "giving him a fit." Pt reports he is taking tylenol for it. Denies using ice or heat. Pt reports he feels like it is hurting when he goes to stand up by pulling on his Up-Walker and is trying instead to keep his arms tucked inside.   *Of note: pt requires increased time for verbal responses to therapist's questions and pt is easily distracted in busy environment  Pt accompanied by: self and male caregiver stays in waiting room, wife present at end of session  PERTINENT  HISTORY: Parkinson's, deep brain stim, depression, lipids, HTN, GERD, ED, dementia, seizures/falls, LBP/PSF, hx of lumbar fusion in 04/2022, hx of hip fx 08/2022, arthritis, frequent falls,  and OSA   PAIN:  Are you having pain? Yes: NPRS scale: 7/10  with activity below, but otherwise 3/10 at rest  Pain location: L calvicle Pain description: ache to sharp Aggravating factors: when going to push-up to stand or control himself when sitting using armrest Relieving factors: rest and support in arm sling  PRECAUTIONS: Fall and Other:    frequent falls, most recent on 05/22/2023 with ED visit, L superolateral clavicular fx (out of sling on 06/15/23 due to pt/family discontinuing it), deep brain stimulator  RED FLAGS: Bowel or bladder incontinence: Yes: "on occasion" has incontinence    WEIGHT BEARING RESTRICTIONS: Yes unsure of specific L UE WBing restrictions, but pt currently using an AD for ambulation, while wearing basic arm sling  FALLS: Has patient fallen in last 6 months? Yes. Number of falls 10+  most recent on 05/22/2023 with injury to L shoulder  LIVING ENVIRONMENT: Lives with: lives with their spouse (wife, Radio broadcast assistant) and has hired caregivers Lives in: House/apartment Stairs: Yes: External: 2-3 steps; bilateral but cannot reach both (due to current home renovations, this is the entrance he uses) primarily uses R HR Has following equipment at home: Wheelchair (manual), shower chair, Grab bars, and Up-Walker  PLOF: Independent with household mobility with device, Requires assistive device for independence, Needs assistance with ADLs, and meal prep   Pt reports he frequently leaves his AD to the side when going to get something, often forgetting it - he requires reinforced education on consistent use of U-step walker  PATIENT GOALS: Improve Balance  OBJECTIVE:  Note: Objective measures were completed at Evaluation unless otherwise noted.  DIAGNOSTIC FINDINGS:   CT CERVICAL SPINE WITHOUT  CONTRAST on 05/22/2023 IMPRESSION: Disc levels: Disc space narrowing and spurring in the lower cervical spine. Mild bilateral degenerative facet disease, left greater than right. Degenerative disc and facet disease throughout the cervical spine. No acute bony abnormality.  CT HEAD WITHOUT CONTRAST on 05/22/2023 FINDINGS: Brain: Deep brain stimulators are noted with the tips in the region of the thalami bilaterally, unchanged. Mild atrophy. No acute intracranial abnormality. Specifically, no hemorrhage, hydrocephalus, mass lesion, acute infarction, or significant intracranial injury. Vascular: No hyperdense vessel or unexpected calcification. Skull: No acute calvarial abnormality. Sinuses/Orbits: No acute findings Other: None IMPRESSION: No acute intracranial abnormality.  LEFT SHOULDER - 2+ VIEW on 05/22/2023 IMPRESSION: Acute mildly displaced fracture of the lateral aspect of the left clavicle with intra-articular extension.   COGNITION: Overall cognitive status: History of cognitive impairments - at baseline and pt with very delayed verbal responses to subjective questioning and noted to be easily distracted in the therapy gym environment   SENSATION: Light touch: Impaired  and decreased distally in B LEs, but specifically decreased throughout R LE compared to L LE   COORDINATION: Not formally assessed  EDEMA:  Not formally assessed, but no significant swelling on visual exam  MUSCLE TONE: WFL  MUSCLE LENGTH: Hamstrings: Right ____ deg; Left ___ deg  *not assessed, but would benefit from testing  DTRs:  Not formally assessed  POSTURE: rounded shoulders, forward head, decreased lumbar lordosis, and posterior pelvic tilt, posterior weight shift bias  LOWER EXTREMITY ROM:     Active  Not formally assessed Right Eval Left Eval  Hip flexion    Hip extension    Hip abduction    Hip adduction    Hip internal rotation    Hip external rotation    Knee flexion     Knee extension    Ankle dorsiflexion Would benefit from testing Would benefit from testing  Ankle plantarflexion    Ankle  inversion    Ankle eversion     (Blank rows = not tested)  LOWER EXTREMITY MMT:    MMT Right Eval Left Eval  Hip flexion 4 4  Hip extension    Hip abduction    Hip adduction    Hip internal rotation    Hip external rotation    Knee flexion 4+ 5  Knee extension 4+ 4+  Ankle dorsiflexion 4- 4  Ankle plantarflexion 4 4-  Ankle inversion    Ankle eversion    (Blank rows = not tested)  Manual Muscle Test Scale 0/5 = No muscle contraction can be seen or felt 1/5 = Contraction can be felt, but there is no motion 2-/5 = Part moves through incomplete ROM w/ gravity decreased 2/5 = Part moves through complete ROM w/ gravity decreased 2+/5 = Part moves through incomplete ROM (<50%) against gravity or through complete ROM w/ gravity 3-/5 = Part moves through incomplete ROM (>50%) against gravity 3/5 = Part moves through complete ROM against gravity 3+/5 = Part moves through complete ROM against gravity/slight resistance 4-/5= Holds test position against slight to moderate pressure 4/5 = Part moves through complete ROM against gravity/moderate resistance 4+/5= Holds test position against moderate to strong pressure 5/5 = Part moves through complete ROM against gravity/full resistance  BED MOBILITY:  Sit to supine Min A and Mod A Supine to sit Min A and Mod A Requires adjustable bed with bedrail at home   TRANSFERS: Assistive device utilized:  Up-Walker   Sit to stand: SBA, CGA, and Min A Stand to sit: SBA, CGA, and Min A Chair to chair: CGA and Min A Floor:  Not assessed **Requires variable assist levels for transfers due to imbalance  RAMP:  Level of Assistance:    Assistive device utilized:  Ramp Comments: Not formally assessed, may benefit from testing in future  CURB:  Level of Assistance:  Assistive device utilized:  Curb Comments: Not  formally assessed, may benefit from testing in future  STAIRS: Level of Assistance:  06/12/23: Min A Stair Negotiation Technique: Forwards with Bilateral Rails  Number of Stairs: 4  Height of Stairs: 6inch  Comments: 06/12/23 - posterior LOB at top of steps when turning around, minor posterior lean throughout  GAIT: Gait pattern: step through pattern, decreased step length- Right, decreased step length- Left, decreased stride length, knee flexed in stance- Right, knee flexed in stance- Left, shuffling, festinating, trunk flexed, wide BOS, poor foot clearance- Right, and poor foot clearance- Left Distance walked: ~118ft Assistive device utilized:  Up-Walker Level of assistance: CGA and Min A Comments: Noticeable festination when turning, stepping back to sit, or side stepping  FUNCTIONAL TESTS:  5 times sit to stand: 22 seconds Timed up and go (TUG): 06/12/23: 29.40 seconds using Up-Walker with CGA/min A 10 meter walk test: 0.56m/s Berg Balance Scale: 20/56  PATIENT SURVEYS:  ABC scale 06/12/23: 30%  TREATMENT DATE:   06/22/23  Pt ambulating in/out of therapy clinic using his Up-Walker.  Unless otherwise stated, CGA/min A was provided and gait belt donned in order to ensure pt safety throughout.  Supine<>sit on mat mod-I.   Supine bridging x15reps progressed to adding level 3 green theraband resistance around knees for increased hip abductor activation 2x 15 reps with therapist cuing for proper form/technique - pt demos slight decreased R hip clearance compared to L.   Side stepping down/back using level 3 green theraband resistance around knees x3 reps ~6-8steps each direction - requires verbal cuing and external target to promote larger step length in both directions, more challenging/shuffled towards L compared to R - heavy min A for balance due to posterior  lean bias.  Gait training 196ft, no UE support, with light min assist for balance/steadying due to R posterior lean bias (primarily due to R hip instability during stance). Pt demonstrating the following gait deviations with therapist providing the described cuing and facilitation for improvement:  - continues to have narrow BOS with lateral instability, but this is improving  - hip drop during R stance phase - short step lengths bilaterally, but improving and with cuing is more consistent - thoracic kyphosis with lack of trunk rotation and arm swing throughout - decreased gait speed  Side stepping down/back x6 steps without theraband resistance with pt demoing improved step length, but more shuffled when stepping towards L compared to R still.  Side stepping over yard stick with dual-task challenge of rotating trunk to reach Blaze Pod on mat behind him in an arching pattern going from L to R with a Blaze Pod in the middle to promote pt coming to stand upright in between - - light min A/CGA for steadying with no significant LOB and pt taking adequate sideways step lengths Changed from yard stick to slightly taller obstacle to step over and pt with significant difficulty with this requiring heavy min/light mod A for balance  Forward step-ups on green step with 1x purple plate with overhead arm reaches while up on step x10 reps - requires consistent min A for balance due to posterior lean when up on step and minor posterior LOB when stepping back down off step - when cued for increased attention to regaining balance when stepping back off, pt with some improvement Could progress to this with Blaze Pods on either side when stepping back down to promote R/L trunk rotation and visual scanning  Pt continues to have posterior lean/LOB bias that worsens with fatigue.  PATIENT EDUCATION: Education details: PT POC, LTGs, findings on balance assessments today Person educated: Patient and Caregiver  Angelica Education method: Explanation Education comprehension: verbalized understanding and needs further education  HOME EXERCISE PROGRAM:   Access Code: [email or web address] URL: [email or web address] Date: 06/20/2023 Prepared by: Casimiro Needle  Exercises - Walking  - 1 x daily - 7 x weekly - 2 sets - 3-5 minutes hold - Sit to Stand  - 1 x daily - 7 x weekly - 2 sets - 12-15 reps - Side Stepping with Resistance at Thighs and Counter Support  - 1 x daily - 7 x weekly - 3 sets - 10 reps - Supine Bridge with Resistance Band  - 1 x daily - 7 x weekly - 2 sets - 10-15 reps  GOALS: Goals reviewed with patient? Yes  SHORT TERM GOALS: Target date: 07/13/2023    Patient will be independent in home exercise program to improve strength/mobility for better functional  independence with ADLs. Baseline: 06/20/2023: HEP provided Goal status: INITIAL  2. Patient will report <2 falls over past 6 weeks to demonstrate improved safety awareness at home.   Baseline: 06/01/2023 = 10+ falls in past 6 months  Goal Status: INITIAL  LONG TERM GOALS: Target date: 08/24/2023   1.  Patient (> 74 years old) will complete five times sit to stand test in < 15 seconds indicating an increased LE strength and improved balance. Baseline: 06/01/23: 22 seconds with posterior LOB Goal status: INITIAL  2.  Patient will increase Berg Balance score by > 6 points to demonstrate decreased fall risk during functional activities. Baseline: 06/01/23: 20/56 Goal status: INITIAL   3. Patient will reduce timed up and go to <11 seconds to reduce fall risk and demonstrate improved transfer/gait ability. Baseline: 06/12/23: 29.40 seconds using Up-Walker with CGA/min A Goal status: INITIAL  4.Patient will increase 10 meter walk test to >1.70m/s as to improve gait speed for better community ambulation and to reduce fall risk. Baseline: 06/01/23: 0.74m/s using Up-Walker Goal status: INITIAL  5.  Patient will require no more  than supervision assist with ascending/descending 3 steps using handrails per home set-up.  Baseline: 06/12/23: requires min A using HR due to posterior lean/LOB especially when turning at the top  Goal status: INITIAL   ASSESSMENT:  CLINICAL IMPRESSION:  Patient is a 73 y.o. male who was seen today for physical therapy treatment for imbalance due to Parkinson's disease. Patient with noticeable posterior lean bias as well as impaired cognition with difficulty sustaining attention in a distracting environment, impacting his balance recovery strategies. Pt also appears to have overall delayed awareness of his LOB with delayed and insufficient recruitment of balance recovery strategies. Patient participated in dynamic balance/gait training with cognitive dual-task challenges today focused on stepping in various directions with turning. He requires consistent min A to remain upright due to repeated posterior lean/LOB. Pt noticed to have very narrow BOS during gait causing lateral instability as well as primarily R posterior LOB bias due to poor sustained L weight shift and R hip weakness. Pt demos improving balance recovery strategies today with ability to implement hip strategy and stepping strategy balance recoveries to maintain upright with decreased assist. Pt will benefit from skilled physical therapy to address his balance deficits in order to decrease his fall risk and promote increased independence with functional mobility and ADLs.   OBJECTIVE IMPAIRMENTS: Abnormal gait, decreased activity tolerance, decreased balance, decreased cognition, decreased endurance, decreased knowledge of condition, decreased knowledge of use of DME, decreased mobility, difficulty walking, decreased strength, decreased safety awareness, hypomobility, impaired flexibility, impaired sensation, improper body mechanics, postural dysfunction, and pain.   ACTIVITY LIMITATIONS: carrying, lifting, bending, standing, squatting,  sleeping, stairs, transfers, bed mobility, continence, bathing, toileting, dressing, reach over head, hygiene/grooming, and locomotion level  PARTICIPATION LIMITATIONS: meal prep, cleaning, laundry, medication management, driving, and community activity  PERSONAL FACTORS: Age, Sex, Time since onset of injury/illness/exacerbation, and 3+ comorbidities: Parkinson's, deep brain stim, depression, lipids, HTN, GERD, ED, dementia, seizures/falls, LBP/PSF, hx of lumbar fusion in 04/2022, hx of hip fx 08/2022, arthritis, frequent falls,  and OSA  are also affecting patient's functional outcome.   REHAB POTENTIAL: Good  CLINICAL DECISION MAKING: Evolving/moderate complexity  EVALUATION COMPLEXITY: Moderate  PLAN:  PT FREQUENCY: 1-2x/week  PT DURATION: 12 weeks  PLANNED INTERVENTIONS: 97164- PT Re-evaluation, 97110-Therapeutic exercises, 97530- Therapeutic activity, [id number]- Neuromuscular re-education, 97535- Self Care, [id number]- Manual therapy, [id number]- Gait training, [phone]- Orthotic Fit/training, [phone]- Electrical stimulation (  manual), Patient/Family education, Balance training, Stair training, Taping, Joint mobilization, Joint manipulation, Spinal mobilization, Vestibular training, Cognitive remediation, DME instructions, Cryotherapy, and Moist heat  PLAN FOR NEXT SESSION:  - reinforce R hip and gluteal strengthening - dynamic balance training to address strong posterior lean/LOB bias with R tendency - dynamic gait training without AD - strengthening hips to create wider BOS for improved balance during gait - ankle DF stretch/ROM - posterior lean bias - poor sustained L weight shift   Oriah Leinweber Verdell Face, PT, DPT, NCS, CSRS Physical Therapist - [place]  [facility]  6:45 PM 06/22/23

## 2023-06-26 ENCOUNTER — Ambulatory Visit: Payer: Medicare PPO | Admitting: Physical Therapy

## 2023-06-26 DIAGNOSIS — M6281 Muscle weakness (generalized): Secondary | ICD-10-CM | POA: Diagnosis not present

## 2023-06-26 DIAGNOSIS — R269 Unspecified abnormalities of gait and mobility: Secondary | ICD-10-CM

## 2023-06-26 DIAGNOSIS — R2689 Other abnormalities of gait and mobility: Secondary | ICD-10-CM | POA: Diagnosis not present

## 2023-06-26 DIAGNOSIS — R2681 Unsteadiness on feet: Secondary | ICD-10-CM | POA: Diagnosis not present

## 2023-06-26 DIAGNOSIS — R296 Repeated falls: Secondary | ICD-10-CM

## 2023-06-26 DIAGNOSIS — R262 Difficulty in walking, not elsewhere classified: Secondary | ICD-10-CM | POA: Diagnosis not present

## 2023-06-26 DIAGNOSIS — R278 Other lack of coordination: Secondary | ICD-10-CM | POA: Diagnosis not present

## 2023-06-26 NOTE — Therapy (Signed)
 OUTPATIENT PHYSICAL THERAPY NEURO TREATMENT   Patient Name: [name] MRN: [id number] DOB:1951/02/23, 73 y.o., male Today's Date: 06/26/2023   PCP: Caffie Pinto, PA-C REFERRING PROVIDER: Suzy Bouchard, PA   END OF SESSION:    PT End of Session - 06/26/23 1317     Visit Number 6    Number of Visits 24    Date for PT Re-Evaluation 08/24/23    Authorization Type [employer] [id number]    PT Start Time 1317    PT Stop Time 1405    PT Time Calculation (min) 48 min    Equipment Utilized During Treatment Gait belt    Activity Tolerance Patient tolerated treatment well;No increased pain    Behavior During Therapy WFL for tasks assessed/performed;Flat affect                  Past Medical History:  Diagnosis Date   Anxiety disorder    Arthritis    Bradycardia    Cancer (HCC) 2013   skin cancer   Depression    ptsd   Dysrhythmia    chronic slow heart rate   GERD (gastroesophageal reflux disease)    H/O tachycardia-bradycardia syndrome    Headache(784.0)    tension headaches non recent   History of chicken pox    History of kidney stones    passed   Hypertension    treated with HCTZ   Major depressive disorder    Mild aortic regurgitation    Pacemaker    Parkinson's disease (HCC)    dx'ed 15 years ago   PTSD (post-traumatic stress disorder)    S/P deep brain stimulator placement    Shortness of breath dyspnea    Sleep apnea    doesn't use C-pap   Unspecified dementia, moderate, with psychotic disturbance (HCC)    Varicose veins    Past Surgical History:  Procedure Laterality Date   CATARACT EXTRACTION W/PHACO Right 01/17/2023   Procedure: CATARACT EXTRACTION PHACO AND INTRAOCULAR LENS PLACEMENT (IOC) RIGHT 6.33 00:42.6;  Surgeon: Galen Manila, MD;  Location: [facility];  Service: Ophthalmology;  Laterality: Right;   CATARACT EXTRACTION W/PHACO Left 02/07/2023   Procedure: CATARACT EXTRACTION PHACO AND INTRAOCULAR LENS PLACEMENT (IOC)  LEFT 6.35 00:38.3;  Surgeon: Galen Manila, MD;  Location: [facility];  Service: Ophthalmology;  Laterality: Left;   CHOLECYSTECTOMY N/A 10/22/2014   Procedure: LAPAROSCOPIC CHOLECYSTECTOMY WITH INTRAOPERATIVE CHOLANGIOGRAM;  Surgeon: Tiney Rouge III, MD;  Location: [facility];  Service: General;  Laterality: N/A;   cyst removed      from lip as a child   INTRAMEDULLARY (IM) NAIL INTERTROCHANTERIC N/A 02/18/2019   Procedure: INTRAMEDULLARY (IM) NAIL INTERTROCHANTRIC, RIGHT,;  Surgeon: Juanell Fairly, MD;  Location: [facility];  Service: Orthopedics;  Laterality: N/A;   LUMBAR LAMINECTOMY/DECOMPRESSION MICRODISCECTOMY Bilateral 12/14/2012   Procedure: Bilateral lumbar three-four, four-five decompressive laminotomy/foraminotomy;  Surgeon: Temple Pacini, MD;  Location: [facility];  Service: Neurosurgery;  Laterality: Bilateral;   PULSE GENERATOR IMPLANT Bilateral 12/13/2013   Procedure: Bilateral implantable pulse generator placement;  Surgeon: Maeola Harman, MD;  Location: [facility];  Service: Neurosurgery;  Laterality: Bilateral;  Bilateral implantable pulse generator placement   skin cancer removed     from ears,   12 lft arm  rt leg 15   SUBTHALAMIC STIMULATOR BATTERY REPLACEMENT Bilateral 07/14/2017   Procedure: BILATERAL IMPLANTED PULSE GENERATOR CHANGE FOR DEEP BRAIN STIMULATOR;  Surgeon: Maeola Harman, MD;  Location: [facility];  Service: Neurosurgery;  Laterality: Bilateral;  SUBTHALAMIC STIMULATOR INSERTION Bilateral 12/06/2013   Procedure: SUBTHALAMIC STIMULATOR INSERTION;  Surgeon: Maeola Harman, MD;  Location: [facility];  Service: Neurosurgery;  Laterality: Bilateral;  Bilateral deep brain stimulator placement   Patient Active Problem List   Diagnosis Date Noted   Hematuria 10/10/2021   Sinus drainage 12/09/2020   Medicare annual wellness visit, subsequent 11/18/2020   Pacemaker    Dementia associated with Parkinson's disease (HCC) 06/17/2020   Vertigo 02/19/2020   Urine  abnormality 02/19/2020   Dysphagia 02/23/2019   Fall at home 10/18/2017   REM behavioral disorder 11/02/2016   Radicular pain in right arm 10/09/2016   GERD (gastroesophageal reflux disease) 07/31/2016   Colon cancer screening 07/23/2015   Gout 02/18/2015   Depression 01/06/2015   Skin lesion 10/02/2014   Dupuytren's contracture 08/20/2014   Cough 07/21/2014   Lumbar stenosis with neurogenic claudication 06/13/2014   S/P deep brain stimulator placement 05/08/2014   Advance care planning 01/19/2014   Parkinson's disease (HCC) 12/13/2013   PTSD (post-traumatic stress disorder) 06/13/2013   Erectile dysfunction 06/13/2013   HLD (hyperlipidemia) 06/13/2013   Essential hypertension 06/03/2013   Bradycardia by electrocardiogram 06/03/2013   Obstructive sleep apnea 03/12/2013    ONSET DATE: ~15 years ago when pt was diagnosed with Parkinson's  REFERRING DIAG: G20.A1 (ICD-10-CM) - Parkinsons disease (HCC)   THERAPY DIAG:  Abnormality of gait and mobility  Unsteadiness on feet  Muscle weakness (generalized)  Repeated falls  Difficulty in walking, not elsewhere classified  Rationale for Evaluation and Treatment: Rehabilitation  SUBJECTIVE:                                                                                                                                                                                             SUBJECTIVE STATEMENT:  Pt reports his L shoulder is still "the same" causing him to be in pain. Reports he is "getting used to it." Rates L shoulder pain as 7/10 currently. Pt reports his shoulder still hurts when he goes to stand because he uses his arms to help him come up to standing.  Pt reports he lost his HEP program and so he did not perform them at home. States it is around his house somewhere.  *Of note: pt requires increased time for verbal responses to therapist's questions and pt is easily distracted in busy environment  Pt accompanied by: self  and male caregiver stays in waiting room, wife present at end of session  PERTINENT HISTORY: Parkinson's, deep brain stim, depression, lipids, HTN, GERD, ED, dementia, seizures/falls, LBP/PSF, hx of lumbar fusion in 04/2022, hx of hip fx 08/2022, arthritis, frequent  falls,  and OSA   PAIN:  Are you having pain? Yes: NPRS scale: 7/10 with activity below, but otherwise 3/10 at rest  Pain location: L calvicle Pain description: ache to sharp Aggravating factors: when going to push-up to stand or control himself when sitting using armrest Relieving factors: rest and support in arm sling  PRECAUTIONS: Fall and Other:    frequent falls, most recent on 05/22/2023 with ED visit, L superolateral clavicular fx (out of sling on 06/15/23 due to pt/family discontinuing it), deep brain stimulator  RED FLAGS: Bowel or bladder incontinence: Yes: "on occasion" has incontinence    WEIGHT BEARING RESTRICTIONS: Yes unsure of specific L UE WBing restrictions, but pt currently using an AD for ambulation, while wearing basic arm sling  FALLS: Has patient fallen in last 6 months? Yes. Number of falls 10+  most recent on 05/22/2023 with injury to L shoulder  LIVING ENVIRONMENT: Lives with: lives with their spouse (wife, Radio broadcast assistant) and has hired caregivers Lives in: House/apartment Stairs: Yes: External: 2-3 steps; bilateral but cannot reach both (due to current home renovations, this is the entrance he uses) primarily uses R HR Has following equipment at home: Wheelchair (manual), shower chair, Grab bars, and Up-Walker  PLOF: Independent with household mobility with device, Requires assistive device for independence, Needs assistance with ADLs, and meal prep   Pt reports he frequently leaves his AD to the side when going to get something, often forgetting it - he requires reinforced education on consistent use of U-step walker  PATIENT GOALS: Improve Balance  OBJECTIVE:  Note: Objective measures were completed at  Evaluation unless otherwise noted.  DIAGNOSTIC FINDINGS:   CT CERVICAL SPINE WITHOUT CONTRAST on 05/22/2023 IMPRESSION: Disc levels: Disc space narrowing and spurring in the lower cervical spine. Mild bilateral degenerative facet disease, left greater than right. Degenerative disc and facet disease throughout the cervical spine. No acute bony abnormality.  CT HEAD WITHOUT CONTRAST on 05/22/2023 FINDINGS: Brain: Deep brain stimulators are noted with the tips in the region of the thalami bilaterally, unchanged. Mild atrophy. No acute intracranial abnormality. Specifically, no hemorrhage, hydrocephalus, mass lesion, acute infarction, or significant intracranial injury. Vascular: No hyperdense vessel or unexpected calcification. Skull: No acute calvarial abnormality. Sinuses/Orbits: No acute findings Other: None IMPRESSION: No acute intracranial abnormality.  LEFT SHOULDER - 2+ VIEW on 05/22/2023 IMPRESSION: Acute mildly displaced fracture of the lateral aspect of the left clavicle with intra-articular extension.   COGNITION: Overall cognitive status: History of cognitive impairments - at baseline and pt with very delayed verbal responses to subjective questioning and noted to be easily distracted in the therapy gym environment   SENSATION: Light touch: Impaired  and decreased distally in B LEs, but specifically decreased throughout R LE compared to L LE   COORDINATION: Not formally assessed  EDEMA:  Not formally assessed, but no significant swelling on visual exam  MUSCLE TONE: WFL  MUSCLE LENGTH: Hamstrings: Right ____ deg; Left ___ deg  *not assessed, but would benefit from testing  DTRs:  Not formally assessed  POSTURE: rounded shoulders, forward head, decreased lumbar lordosis, and posterior pelvic tilt, posterior weight shift bias  LOWER EXTREMITY ROM:     Active  Not formally assessed Right Eval Left Eval  Hip flexion    Hip extension    Hip  abduction    Hip adduction    Hip internal rotation    Hip external rotation    Knee flexion    Knee extension    Ankle  dorsiflexion Would benefit from testing Would benefit from testing  Ankle plantarflexion    Ankle inversion    Ankle eversion     (Blank rows = not tested)  LOWER EXTREMITY MMT:    MMT Right Eval Left Eval  Hip flexion 4 4  Hip extension    Hip abduction    Hip adduction    Hip internal rotation    Hip external rotation    Knee flexion 4+ 5  Knee extension 4+ 4+  Ankle dorsiflexion 4- 4  Ankle plantarflexion 4 4-  Ankle inversion    Ankle eversion    (Blank rows = not tested)  Manual Muscle Test Scale 0/5 = No muscle contraction can be seen or felt 1/5 = Contraction can be felt, but there is no motion 2-/5 = Part moves through incomplete ROM w/ gravity decreased 2/5 = Part moves through complete ROM w/ gravity decreased 2+/5 = Part moves through incomplete ROM (<50%) against gravity or through complete ROM w/ gravity 3-/5 = Part moves through incomplete ROM (>50%) against gravity 3/5 = Part moves through complete ROM against gravity 3+/5 = Part moves through complete ROM against gravity/slight resistance 4-/5= Holds test position against slight to moderate pressure 4/5 = Part moves through complete ROM against gravity/moderate resistance 4+/5= Holds test position against moderate to strong pressure 5/5 = Part moves through complete ROM against gravity/full resistance  BED MOBILITY:  Sit to supine Min A and Mod A Supine to sit Min A and Mod A Requires adjustable bed with bedrail at home   TRANSFERS: Assistive device utilized:  Up-Walker   Sit to stand: SBA, CGA, and Min A Stand to sit: SBA, CGA, and Min A Chair to chair: CGA and Min A Floor:  Not assessed **Requires variable assist levels for transfers due to imbalance  RAMP:  Level of Assistance:    Assistive device utilized:  Ramp Comments: Not formally assessed, may benefit from testing  in future  CURB:  Level of Assistance:  Assistive device utilized:  Curb Comments: Not formally assessed, may benefit from testing in future  STAIRS: Level of Assistance:  06/12/23: Min A Stair Negotiation Technique: Forwards with Bilateral Rails  Number of Stairs: 4  Height of Stairs: 6inch  Comments: 06/12/23 - posterior LOB at top of steps when turning around, minor posterior lean throughout  GAIT: Gait pattern: step through pattern, decreased step length- Right, decreased step length- Left, decreased stride length, knee flexed in stance- Right, knee flexed in stance- Left, shuffling, festinating, trunk flexed, wide BOS, poor foot clearance- Right, and poor foot clearance- Left Distance walked: ~115ft Assistive device utilized:  Up-Walker Level of assistance: CGA and Min A Comments: Noticeable festination when turning, stepping back to sit, or side stepping  FUNCTIONAL TESTS:  5 times sit to stand: 22 seconds Timed up and go (TUG): 06/12/23: 29.40 seconds using Up-Walker with CGA/min A 10 meter walk test: 0.65m/s Berg Balance Scale: 20/56  PATIENT SURVEYS:  ABC scale 06/12/23: 30%  TREATMENT DATE:   06/26/23  Pt using transport chair in/out of therapy today.  Unless otherwise stated, CGA/min A was provided and gait belt donned in order to ensure pt safety throughout.  Supine<>sit on mat mod-I.   Supine bridging x15reps progressed to adding level 3 green theraband resistance around knees for increased hip abductor activation 2x 15 reps with therapist cuing for proper form/technique - pt demos slight decreased R hip clearance compared to L.   Sit<>stands from EOM, with level 3 green tband around knees for hip abductor activation, to standing tapping top of mirror to promote anterior trunk lean and full, upright posture 2 x10reps - first stand, pt with  posterior LOB onto mat sitting back down, and then this happens x2 during 2nd set.  Side stepping down/back using level 3 green theraband resistance around knees x3 reps ~6-8steps each direction - requires verbal cuing and external target to promote larger step length in both directions, more challenging/shuffled towards L compared to R - heavy min A for balance due to R posterior lean bias throughout.   Gait training ~261ft, no AD, with light min A for balance - continues to have R hip instability that becomes more prominent with fatigue - has more more normal BOS with decreased narrowness - continues to have lateral instability, lack of arm and trunk swing.  Dynamic balance and dual-task challenge of forward/backwards stepping on/off green step with 1x purple plate with alternating LE leading, while having contralateral hand hit Blaze Pod up on mirror for upright posture. Blaze Pods set on sequence setting to enhance cognitive function and motor skills, providing a structured environment to follow a specific pattern and improve memory, coordination, and movement sequences.  - CGA to min A for balance - pt continues to have R posterior LOB/lean bias likely associated with R hip pain and weakness   Dynamic gait training using agility ladder including the following:  - forwards reciprocal stepping pattern - primarily CGA with occasional min A - continued cuing to keep wider BOS with feet towards ipsilateral side of ladder - side stepping down/back with more consistent min A today - cuing for larger step lengths - requires CGA/light min A - progressed to side stepping 2 feet in, 2 feet out with total cuing for sequencing and min A for balance - has greatest challenge stepping diagonally back to L - requires consistent min A  Pt continues to have R biased posterior lean/LOB bias that worsens with fatigue.  PATIENT EDUCATION: Education details: PT POC, LTGs, findings on balance assessments  today Person educated: Patient and Caregiver Angelica Education method: Explanation Education comprehension: verbalized understanding and needs further education  HOME EXERCISE PROGRAM:   Access Code: [email or web address] URL: [email or web address] Date: 06/20/2023 Prepared by: Casimiro Needle  Exercises - Walking  - 1 x daily - 7 x weekly - 2 sets - 3-5 minutes hold - Sit to Stand  - 1 x daily - 7 x weekly - 2 sets - 12-15 reps - Side Stepping with Resistance at Thighs and Counter Support  - 1 x daily - 7 x weekly - 3 sets - 10 reps - Supine Bridge with Resistance Band  - 1 x daily - 7 x weekly - 2 sets - 10-15 reps  GOALS: Goals reviewed with patient? Yes  SHORT TERM GOALS: Target date: 07/13/2023    Patient will be independent in home exercise program to improve strength/mobility for better functional independence with ADLs. Baseline: 06/20/2023: HEP provided Goal status: INITIAL  2. Patient will report <2 falls over past 6 weeks to demonstrate improved safety awareness at home.   Baseline: 06/01/2023 = 10+ falls in past 6 months  Goal Status: INITIAL  LONG TERM GOALS: Target date: 08/24/2023   1.  Patient (> 16 years old) will complete five times sit to stand test in < 15 seconds indicating an increased LE strength and improved balance. Baseline: 06/01/23: 22 seconds with posterior LOB Goal status: INITIAL  2.  Patient will increase Berg Balance score by > 6 points to demonstrate decreased fall risk during functional activities. Baseline: 06/01/23: 20/56 Goal status: INITIAL   3. Patient will reduce timed up and go to <11 seconds to reduce fall risk and demonstrate improved transfer/gait ability. Baseline: 06/12/23: 29.40 seconds using Up-Walker with CGA/min A Goal status: INITIAL  4.Patient will increase 10 meter walk test to >1.67m/s as to improve gait speed for better community ambulation and to reduce fall risk. Baseline: 06/01/23: 0.60m/s using Up-Walker Goal status:  INITIAL  5.  Patient will require no more than supervision assist with ascending/descending 3 steps using handrails per home set-up.  Baseline: 06/12/23: requires min A using HR due to posterior lean/LOB especially when turning at the top  Goal status: INITIAL   ASSESSMENT:  CLINICAL IMPRESSION:  Patient is a 73 y.o. male who was seen today for physical therapy treatment for imbalance due to Parkinson's disease. Patient with noticeable posterior lean bias as well as impaired cognition with difficulty sustaining attention in a distracting environment, impacting his balance recovery strategies. Pt also appears to have overall delayed awareness of his LOB with delayed and insufficient recruitment of balance recovery strategies. Patient participated in dynamic balance/gait training with cognitive dual-task challenges today focused on stepping in various directions with turning. He requires frequent min A to remain upright due to repeated posterior lean/LOB. Pt demos improving balance recovery strategies today with ability to implement hip strategy and stepping strategy to maintain upright with decreased assist. Pt demos improved ability to participate in dynamic gait training tasks using agility ladder today. Pt will benefit from skilled physical therapy to address his balance deficits in order to decrease his fall risk and promote increased independence with functional mobility and ADLs.   OBJECTIVE IMPAIRMENTS: Abnormal gait, decreased activity tolerance, decreased balance, decreased cognition, decreased endurance, decreased knowledge of condition, decreased knowledge of use of DME, decreased mobility, difficulty walking, decreased strength, decreased safety awareness, hypomobility, impaired flexibility, impaired sensation, improper body mechanics, postural dysfunction, and pain.   ACTIVITY LIMITATIONS: carrying, lifting, bending, standing, squatting, sleeping, stairs, transfers, bed mobility, continence,  bathing, toileting, dressing, reach over head, hygiene/grooming, and locomotion level  PARTICIPATION LIMITATIONS: meal prep, cleaning, laundry, medication management, driving, and community activity  PERSONAL FACTORS: Age, Sex, Time since onset of injury/illness/exacerbation, and 3+ comorbidities: Parkinson's, deep brain stim, depression, lipids, HTN, GERD, ED, dementia, seizures/falls, LBP/PSF, hx of lumbar fusion in 04/2022, hx of hip fx 08/2022, arthritis, frequent falls,  and OSA  are also affecting patient's functional outcome.   REHAB POTENTIAL: Good  CLINICAL DECISION MAKING: Evolving/moderate complexity  EVALUATION COMPLEXITY: Moderate  PLAN:  PT FREQUENCY: 1-2x/week  PT DURATION: 12 weeks  PLANNED INTERVENTIONS: 97164- PT Re-evaluation, 97110-Therapeutic exercises, 97530- Therapeutic activity, 97112- Neuromuscular re-education, 97535- Self Care, 19147- Manual therapy, [phone]- Gait training, [phone]- Orthotic Fit/training, [phone]- Electrical stimulation (manual), Patient/Family education, Balance training, Stair training, Taping, Joint mobilization, Joint manipulation, Spinal mobilization, Vestibular training, Cognitive remediation, DME instructions, Cryotherapy, and Moist heat  PLAN FOR NEXT SESSION:  -  reinforce R hip and gluteal strengthening - dynamic balance training to address strong posterior lean/LOB with R bias - dynamic gait training without AD - strengthening hips to create wider BOS for improved balance during gait - ankle DF stretch/ROM - posterior lean bias - poor sustained L weight shift   Marylen Zuk Verdell Face, PT, DPT, NCS, CSRS Physical Therapist - [place]  [facility]  2:23 PM 06/26/23

## 2023-06-27 ENCOUNTER — Ambulatory Visit (INDEPENDENT_AMBULATORY_CARE_PROVIDER_SITE_OTHER): Payer: Medicare PPO | Admitting: Dermatology

## 2023-06-27 ENCOUNTER — Ambulatory Visit: Payer: Medicare PPO | Admitting: Psychology

## 2023-06-27 ENCOUNTER — Encounter: Payer: Self-pay | Admitting: Dermatology

## 2023-06-27 DIAGNOSIS — D2272 Melanocytic nevi of left lower limb, including hip: Secondary | ICD-10-CM

## 2023-06-27 DIAGNOSIS — L814 Other melanin hyperpigmentation: Secondary | ICD-10-CM

## 2023-06-27 DIAGNOSIS — L578 Other skin changes due to chronic exposure to nonionizing radiation: Secondary | ICD-10-CM | POA: Diagnosis not present

## 2023-06-27 DIAGNOSIS — W908XXA Exposure to other nonionizing radiation, initial encounter: Secondary | ICD-10-CM

## 2023-06-27 DIAGNOSIS — L820 Inflamed seborrheic keratosis: Secondary | ICD-10-CM

## 2023-06-27 DIAGNOSIS — D692 Other nonthrombocytopenic purpura: Secondary | ICD-10-CM

## 2023-06-27 DIAGNOSIS — D229 Melanocytic nevi, unspecified: Secondary | ICD-10-CM

## 2023-06-27 DIAGNOSIS — Z1283 Encounter for screening for malignant neoplasm of skin: Secondary | ICD-10-CM

## 2023-06-27 DIAGNOSIS — L817 Pigmented purpuric dermatosis: Secondary | ICD-10-CM | POA: Diagnosis not present

## 2023-06-27 DIAGNOSIS — L821 Other seborrheic keratosis: Secondary | ICD-10-CM

## 2023-06-27 NOTE — Progress Notes (Signed)
 Follow-Up Visit   Subjective  [name] is a 73 y.o. male who presents for the following: Skin Cancer Screening and Full Body Skin Exam The patient presents for Total-Body Skin Exam (TBSE) for skin cancer screening and mole check. The patient has spots, moles and lesions to be evaluated, some may be new or changing and the patient may have concern these could be cancer.  The following portions of the chart were reviewed this encounter and updated as appropriate: medications, allergies, medical history  Review of Systems:  No other skin or systemic complaints except as noted in HPI or Assessment and Plan.  Objective  Well appearing patient in no apparent distress; mood and affect are within normal limits.  A full examination was performed including scalp, head, eyes, ears, nose, lips, neck, chest, axillae, abdomen, back, buttocks, bilateral upper extremities, bilateral lower extremities, hands, feet, fingers, toes, fingernails, and toenails. All findings within normal limits unless otherwise noted below.   Relevant physical exam findings are noted in the Assessment and Plan.  R temple x 1 Stuck on waxy paps with erythema  Assessment & Plan   SKIN CANCER SCREENING PERFORMED TODAY.  ACTINIC DAMAGE - Chronic condition, secondary to cumulative UV/sun exposure - diffuse scaly erythematous macules with underlying dyspigmentation - Recommend daily broad spectrum sunscreen SPF 30+ to sun-exposed areas, reapply every 2 hours as needed.  - Staying in the shade or wearing long sleeves, sun glasses (UVA+UVB protection) and wide brim hats (4-inch brim around the entire circumference of the hat) are also recommended for sun protection.  - Call for new or changing lesions.  LENTIGINES, SEBORRHEIC KERATOSES, HEMANGIOMAS - Benign normal skin lesions - Benign-appearing - Call for any changes  MELANOCYTIC NEVI - Tan-brown and/or pink-flesh-colored symmetric macules and papules - Benign  appearing on exam today - Observation - Call clinic for new or changing moles - Recommend daily use of broad spectrum spf 30+ sunscreen to sun-exposed areas.   NEVUS vs GANGLION CYST L mid sole Exam: 0.6cm flesh colored pap Treatment Plan: Benign-appearing. Stable compared to previous visit. Observation.  Call clinic for new or changing moles.  Recommend daily use of broad spectrum spf 30+ sunscreen to sun-exposed areas.   Purpura - Chronic; persistent and recurrent.  Treatable, but not curable. - Violaceous macules and patches - Benign - Related to trauma, age, sun damage and/or use of blood thinners, chronic use of topical and/or oral steroids - Observe - Can use OTC arnica containing moisturizer such as Dermend Bruise Formula if desired - Call for worsening or other concerns  SCHAMBERG'S PURPURA Lower legs Exam: stasis changes lower legs Stasis in the legs causes chronic leg swelling, which may result in itchy or painful rashes, skin discoloration, skin texture changes, and sometimes ulceration.  Recommend daily graduated compression hose/stockings- easiest to put on first thing in morning, remove at bedtime.  Elevate legs as much as possible. Avoid salt/sodium rich foods.  Treatment Plan: Recommend Compression socks qd  INFLAMED SEBORRHEIC KERATOSIS R temple x 1 Symptomatic, irritating, patient would like treated. Destruction of lesion - R temple x 1 Complexity: simple   Destruction method: cryotherapy   Informed consent: discussed and consent obtained   Timeout:  patient name, date of birth, surgical site, and procedure verified Lesion destroyed using liquid nitrogen: Yes   Region frozen until ice ball extended beyond lesion: Yes   Outcome: patient tolerated procedure well with no complications   Post-procedure details: wound care instructions given  Return in about 1 year (around 06/26/2024) for TBSE.  I, Ardis Rowan, RMA, am acting as scribe for Armida Sans, MD  .   Documentation: I have reviewed the above documentation for accuracy and completeness, and I agree with the above.  Armida Sans, MD

## 2023-06-27 NOTE — Patient Instructions (Addendum)
 Cryotherapy Aftercare  Wash gently with soap and water everyday.   Apply Vaseline and Band-Aid daily until healed.    Due to recent changes in healthcare laws, you may see results of your pathology and/or laboratory studies on MyChart before the doctors have had a chance to review them. We understand that in some cases there may be results that are confusing or concerning to you. Please understand that not all results are received at the same time and often the doctors may need to interpret multiple results in order to provide you with the best plan of care or course of treatment. Therefore, we ask that you please give [place] 2 business days to thoroughly review all your results before contacting the office for clarification. Should we see a critical lab result, you will be contacted sooner.   If You Need Anything After Your Visit  If you have any questions or concerns for your doctor, please call our main line at [phone] and press option 4 to reach your doctor's medical assistant. If no one answers, please leave a voicemail as directed and we will return your call as soon as possible. Messages left after 4 pm will be answered the following business day.   You may also send [place] a message via MyChart. We typically respond to MyChart messages within 1-2 business days.  For prescription refills, please ask your pharmacy to contact our office. Our fax number is [phone].  If you have an urgent issue when the clinic is closed that cannot wait until the next business day, you can page your doctor at the number below.    Please note that while we do our best to be available for urgent issues outside of office hours, we are not available 24/7.   If you have an urgent issue and are unable to reach [place], you may choose to seek medical care at your doctor's office, retail clinic, urgent care center, or emergency room.  If you have a medical emergency, please immediately call 911 or go to the emergency  department.  Pager Numbers  - Dr. Gwen Pounds: [phone]  - Dr. Roseanne Reno: [phone]  - Dr. Katrinka Blazing: [phone]   In the event of inclement weather, please call our main line at [phone] for an update on the status of any delays or closures.  Dermatology Medication Tips: Please keep the boxes that topical medications come in in order to help keep track of the instructions about where and how to use these. Pharmacies typically print the medication instructions only on the boxes and not directly on the medication tubes.   If your medication is too expensive, please contact our office at [phone] option 4 or send [place] a message through MyChart.   We are unable to tell what your co-pay for medications will be in advance as this is different depending on your insurance coverage. However, we may be able to find a substitute medication at lower cost or fill out paperwork to get insurance to cover a needed medication.   If a prior authorization is required to get your medication covered by your insurance company, please allow [place] 1-2 business days to complete this process.  Drug prices often vary depending on where the prescription is filled and some pharmacies may offer cheaper prices.  The website www.goodrx.com contains coupons for medications through different pharmacies. The prices here do not account for what the cost may be with help from insurance (it may be cheaper with your insurance), but the website can  give you the price if you did not use any insurance.  - You can print the associated coupon and take it with your prescription to the pharmacy.  - You may also stop by our office during regular business hours and pick up a GoodRx coupon card.  - If you need your prescription sent electronically to a different pharmacy, notify our office through [facility] or by phone at [phone] option 4.     Si Usted Necesita Algo Despus de Su Visita  Tambin puede enviarnos  un mensaje a travs de Clinical cytogeneticist. Por lo general respondemos a los mensajes de MyChart en el transcurso de 1 a 2 das hbiles.  Para renovar recetas, por favor pida a su farmacia que se ponga en contacto con nuestra oficina. Annie Sable de fax es [place] [phone].  Si tiene un asunto urgente cuando la clnica est cerrada y que no puede esperar hasta el siguiente da hbil, puede llamar/localizar a su doctor(a) al nmero que aparece a continuacin.   Por favor, tenga en cuenta que aunque hacemos todo lo posible para estar disponibles para asuntos urgentes fuera del horario de [place], no estamos disponibles las 24 horas del da, los 7 [address]  [address] de la [place].   Si tiene un problema urgente y no puede comunicarse con nosotros, puede optar por buscar atencin mdica  en el consultorio de su doctor(a), en una clnica privada, en un centro de atencin urgente o en una sala de emergencias.  Si tiene Engineer, drilling, por favor llame inmediatamente al 911 o vaya a la sala de emergencias.  Nmeros de bper  - Dr. Gwen Pounds: [phone]  - Dra. Roseanne Reno: [phone]  - Dr. Katrinka Blazing: [phone]   En caso de inclemencias del tiempo, por favor llame a Lacy Duverney principal al [phone] para una actualizacin sobre el [place] de cualquier retraso o cierre.  Consejos para la medicacin en dermatologa: Por favor, guarde las cajas en las que vienen los medicamentos de uso tpico para ayudarle a seguir las instrucciones sobre dnde y cmo usarlos. Las farmacias generalmente imprimen las instrucciones del medicamento slo en las cajas y no directamente en los tubos del [place].   Si su medicamento es muy caro, por favor, pngase en contacto con Rolm Gala llamando al [phone] y presione la opcin 4 o envenos un mensaje a travs de Clinical cytogeneticist.   No podemos decirle cul ser su copago por los medicamentos por adelantado ya que esto es diferente dependiendo de la cobertura de su seguro. Sin  embargo, es posible que podamos encontrar un medicamento sustituto a Audiological scientist un formulario para que el seguro cubra el medicamento que se considera necesario.   Si se requiere una autorizacin previa para que su compaa de seguros [place] su medicamento, por favor permtanos de 1 a 2 das hbiles para completar [address]  Los precios de los medicamentos varan con frecuencia dependiendo del Environmental consultant de dnde se surte la receta y alguna farmacias pueden ofrecer precios ms baratos.  El sitio web www.goodrx.com tiene cupones para medicamentos de Health and safety inspector. Los precios aqu no tienen en cuenta lo que podra costar con la ayuda del seguro (puede ser ms barato con su seguro), pero el sitio web puede darle el precio si no utiliz Tourist information centre manager.  - Puede imprimir el cupn correspondiente y llevarlo con su receta a la farmacia.  - Tambin puede pasar por nuestra oficina durante el horario de atencin regular y Education officer, museum una tarjeta de cupones de GoodRx.  -  Si necesita que su receta se enve electrnicamente a Psychiatrist, informe a nuestra oficina a travs de MyChart de [place] o por telfono llamando al [phone] y presione la opcin 4.

## 2023-06-28 ENCOUNTER — Ambulatory Visit: Payer: Medicare PPO | Admitting: Physical Therapy

## 2023-06-28 DIAGNOSIS — M6281 Muscle weakness (generalized): Secondary | ICD-10-CM

## 2023-06-28 DIAGNOSIS — R269 Unspecified abnormalities of gait and mobility: Secondary | ICD-10-CM | POA: Diagnosis not present

## 2023-06-28 DIAGNOSIS — R2689 Other abnormalities of gait and mobility: Secondary | ICD-10-CM

## 2023-06-28 DIAGNOSIS — R296 Repeated falls: Secondary | ICD-10-CM | POA: Diagnosis not present

## 2023-06-28 DIAGNOSIS — R262 Difficulty in walking, not elsewhere classified: Secondary | ICD-10-CM

## 2023-06-28 DIAGNOSIS — R278 Other lack of coordination: Secondary | ICD-10-CM

## 2023-06-28 DIAGNOSIS — R2681 Unsteadiness on feet: Secondary | ICD-10-CM | POA: Diagnosis not present

## 2023-06-28 NOTE — Therapy (Signed)
 OUTPATIENT PHYSICAL THERAPY NEURO TREATMENT   Patient Name: [name] MRN: [id number] DOB:[birth year], 73 y.o., male Today's Date: 06/28/2023   PCP: Caffie Pinto, PA-C REFERRING PROVIDER: Suzy Bouchard, PA   END OF SESSION:    PT End of Session - 06/28/23 1312     Visit Number 7    Number of Visits 24    Date for PT Re-Evaluation 08/24/23    Authorization Type [employer] [id number]    PT Start Time 1315    PT Stop Time 1407    PT Time Calculation (min) 52 min    Equipment Utilized During Treatment Gait belt    Activity Tolerance Patient tolerated treatment well;No increased pain    Behavior During Therapy WFL for tasks assessed/performed;Flat affect                   Past Medical History:  Diagnosis Date   Anxiety disorder    Arthritis    Bradycardia    Cancer (HCC) 2013   skin cancer   Depression    ptsd   Dysrhythmia    chronic slow heart rate   GERD (gastroesophageal reflux disease)    H/O tachycardia-bradycardia syndrome    Headache(784.0)    tension headaches non recent   History of chicken pox    History of kidney stones    passed   Hypertension    treated with HCTZ   Major depressive disorder    Mild aortic regurgitation    Pacemaker    Parkinson's disease (HCC)    dx'ed 15 years ago   PTSD (post-traumatic stress disorder)    S/P deep brain stimulator placement    Shortness of breath dyspnea    Sleep apnea    doesn't use C-pap   Unspecified dementia, moderate, with psychotic disturbance (HCC)    Varicose veins    Past Surgical History:  Procedure Laterality Date   CATARACT EXTRACTION W/PHACO Right 01/17/2023   Procedure: CATARACT EXTRACTION PHACO AND INTRAOCULAR LENS PLACEMENT (IOC) RIGHT 6.33 00:42.6;  Surgeon: Galen Manila, MD;  Location: [facility];  Service: Ophthalmology;  Laterality: Right;   CATARACT EXTRACTION W/PHACO Left 02/07/2023   Procedure: CATARACT EXTRACTION PHACO AND INTRAOCULAR LENS PLACEMENT (IOC)  LEFT 6.35 00:38.3;  Surgeon: Galen Manila, MD;  Location: [facility];  Service: Ophthalmology;  Laterality: Left;   CHOLECYSTECTOMY N/A 10/22/2014   Procedure: LAPAROSCOPIC CHOLECYSTECTOMY WITH INTRAOPERATIVE CHOLANGIOGRAM;  Surgeon: Tiney Rouge III, MD;  Location: [facility];  Service: General;  Laterality: N/A;   cyst removed      from lip as a child   INTRAMEDULLARY (IM) NAIL INTERTROCHANTERIC N/A 02/18/2019   Procedure: INTRAMEDULLARY (IM) NAIL INTERTROCHANTRIC, RIGHT,;  Surgeon: Juanell Fairly, MD;  Location: [facility];  Service: Orthopedics;  Laterality: N/A;   LUMBAR LAMINECTOMY/DECOMPRESSION MICRODISCECTOMY Bilateral 12/14/2012   Procedure: Bilateral lumbar three-four, four-five decompressive laminotomy/foraminotomy;  Surgeon: Temple Pacini, MD;  Location: [facility];  Service: Neurosurgery;  Laterality: Bilateral;   PULSE GENERATOR IMPLANT Bilateral 12/13/2013   Procedure: Bilateral implantable pulse generator placement;  Surgeon: Maeola Harman, MD;  Location: [facility];  Service: Neurosurgery;  Laterality: Bilateral;  Bilateral implantable pulse generator placement   skin cancer removed     from ears,   12 lft arm  rt leg 15   SUBTHALAMIC STIMULATOR BATTERY REPLACEMENT Bilateral 07/14/2017   Procedure: BILATERAL IMPLANTED PULSE GENERATOR CHANGE FOR DEEP BRAIN STIMULATOR;  Surgeon: Maeola Harman, MD;  Location: [facility];  Service: Neurosurgery;  Laterality:  Bilateral;   SUBTHALAMIC STIMULATOR INSERTION Bilateral 12/06/2013   Procedure: SUBTHALAMIC STIMULATOR INSERTION;  Surgeon: Maeola Harman, MD;  Location: [facility];  Service: Neurosurgery;  Laterality: Bilateral;  Bilateral deep brain stimulator placement   Patient Active Problem List   Diagnosis Date Noted   Hematuria 10/10/2021   Sinus drainage 12/09/2020   Medicare annual wellness visit, subsequent 11/18/2020   Pacemaker    Dementia associated with Parkinson's disease (HCC) 06/17/2020   Vertigo 02/19/2020   Urine  abnormality 02/19/2020   Dysphagia 02/23/2019   Fall at home 10/18/2017   REM behavioral disorder 11/02/2016   Radicular pain in right arm 10/09/2016   GERD (gastroesophageal reflux disease) 07/31/2016   Colon cancer screening 07/23/2015   Gout 02/18/2015   Depression 01/06/2015   Skin lesion 10/02/2014   Dupuytren's contracture 08/20/2014   Cough 07/21/2014   Lumbar stenosis with neurogenic claudication 06/13/2014   S/P deep brain stimulator placement 05/08/2014   Advance care planning 01/19/2014   Parkinson's disease (HCC) 12/13/2013   PTSD (post-traumatic stress disorder) 06/13/2013   Erectile dysfunction 06/13/2013   HLD (hyperlipidemia) 06/13/2013   Essential hypertension 06/03/2013   Bradycardia by electrocardiogram 06/03/2013   Obstructive sleep apnea 03/12/2013    ONSET DATE: ~15 years ago when pt was diagnosed with Parkinson's  REFERRING DIAG: G20.A1 (ICD-10-CM) - Parkinsons disease (HCC)   THERAPY DIAG:  Abnormality of gait and mobility  Unsteadiness on feet  Muscle weakness (generalized)  Repeated falls  Difficulty in walking, not elsewhere classified  Other abnormalities of gait and mobility  Other lack of coordination  Rationale for Evaluation and Treatment: Rehabilitation  SUBJECTIVE:                                                                                                                                                                                             SUBJECTIVE STATEMENT:  Pt reports overall doing good. States his R posterior hip is hurting, rated as 5/10, specifically states it is aggravated with walking and getting in/out of the car. Pt reports his L shoulder is still hurting him. States he plans to call his PCP to schedule a follow-up visit regarding his shoulder.  *Of note: pt requires increased time for verbal responses to therapist's questions and pt is easily distracted in busy environment  Pt accompanied by: self and male  caregiver stays in waiting room, wife present at end of session  PERTINENT HISTORY: Parkinson's, deep brain stim, depression, lipids, HTN, GERD, ED, dementia, seizures/falls, LBP/PSF, hx of lumbar fusion in 04/2022, hx of hip fx 08/2022, arthritis, frequent falls,  and OSA   PAIN:  Are  you having pain? Yes: NPRS scale: 7/10 with activity below, but otherwise 3/10 at rest  Pain location: L calvicle Pain description: ache to sharp Aggravating factors: when going to push-up to stand or control himself when sitting using armrest Relieving factors: rest and support in arm sling  PRECAUTIONS: Fall and Other:    frequent falls, most recent on 05/22/2023 with ED visit, L superolateral clavicular fx (out of sling on 06/15/23 due to pt/family discontinuing it), deep brain stimulator  RED FLAGS: Bowel or bladder incontinence: Yes: "on occasion" has incontinence    WEIGHT BEARING RESTRICTIONS: Yes unsure of specific L UE WBing restrictions, but pt currently using an AD for ambulation, while wearing basic arm sling  FALLS: Has patient fallen in last 6 months? Yes. Number of falls 10+  most recent on 05/22/2023 with injury to L shoulder  LIVING ENVIRONMENT: Lives with: lives with their spouse (wife, Radio broadcast assistant) and has hired caregivers Lives in: House/apartment Stairs: Yes: External: 2-3 steps; bilateral but cannot reach both (due to current home renovations, this is the entrance he uses) primarily uses R HR Has following equipment at home: Wheelchair (manual), shower chair, Grab bars, and Up-Walker  PLOF: Independent with household mobility with device, Requires assistive device for independence, Needs assistance with ADLs, and meal prep   Pt reports he frequently leaves his AD to the side when going to get something, often forgetting it - he requires reinforced education on consistent use of U-step walker  PATIENT GOALS: Improve Balance  OBJECTIVE:  Note: Objective measures were completed at Evaluation  unless otherwise noted.  DIAGNOSTIC FINDINGS:   CT CERVICAL SPINE WITHOUT CONTRAST on 05/22/2023 IMPRESSION: Disc levels: Disc space narrowing and spurring in the lower cervical spine. Mild bilateral degenerative facet disease, left greater than right. Degenerative disc and facet disease throughout the cervical spine. No acute bony abnormality.  CT HEAD WITHOUT CONTRAST on 05/22/2023 FINDINGS: Brain: Deep brain stimulators are noted with the tips in the region of the thalami bilaterally, unchanged. Mild atrophy. No acute intracranial abnormality. Specifically, no hemorrhage, hydrocephalus, mass lesion, acute infarction, or significant intracranial injury. Vascular: No hyperdense vessel or unexpected calcification. Skull: No acute calvarial abnormality. Sinuses/Orbits: No acute findings Other: None IMPRESSION: No acute intracranial abnormality.  LEFT SHOULDER - 2+ VIEW on 05/22/2023 IMPRESSION: Acute mildly displaced fracture of the lateral aspect of the left clavicle with intra-articular extension.   COGNITION: Overall cognitive status: History of cognitive impairments - at baseline and pt with very delayed verbal responses to subjective questioning and noted to be easily distracted in the therapy gym environment   SENSATION: Light touch: Impaired  and decreased distally in B LEs, but specifically decreased throughout R LE compared to L LE   COORDINATION: Not formally assessed  EDEMA:  Not formally assessed, but no significant swelling on visual exam  MUSCLE TONE: WFL  MUSCLE LENGTH: Hamstrings: Right ____ deg; Left ___ deg  *not assessed, but would benefit from testing  DTRs:  Not formally assessed  POSTURE: rounded shoulders, forward head, decreased lumbar lordosis, and posterior pelvic tilt, posterior weight shift bias  LOWER EXTREMITY ROM:     Active  Not formally assessed Right Eval Left Eval  Hip flexion    Hip extension    Hip abduction    Hip  adduction    Hip internal rotation    Hip external rotation    Knee flexion    Knee extension    Ankle dorsiflexion Would benefit from testing Would benefit from testing  Ankle plantarflexion    Ankle inversion    Ankle eversion     (Blank rows = not tested)  LOWER EXTREMITY MMT:    MMT Right Eval Left Eval  Hip flexion 4 4  Hip extension    Hip abduction    Hip adduction    Hip internal rotation    Hip external rotation    Knee flexion 4+ 5  Knee extension 4+ 4+  Ankle dorsiflexion 4- 4  Ankle plantarflexion 4 4-  Ankle inversion    Ankle eversion    (Blank rows = not tested)  Manual Muscle Test Scale 0/5 = No muscle contraction can be seen or felt 1/5 = Contraction can be felt, but there is no motion 2-/5 = Part moves through incomplete ROM w/ gravity decreased 2/5 = Part moves through complete ROM w/ gravity decreased 2+/5 = Part moves through incomplete ROM (<50%) against gravity or through complete ROM w/ gravity 3-/5 = Part moves through incomplete ROM (>50%) against gravity 3/5 = Part moves through complete ROM against gravity 3+/5 = Part moves through complete ROM against gravity/slight resistance 4-/5= Holds test position against slight to moderate pressure 4/5 = Part moves through complete ROM against gravity/moderate resistance 4+/5= Holds test position against moderate to strong pressure 5/5 = Part moves through complete ROM against gravity/full resistance  BED MOBILITY:  Sit to supine Min A and Mod A Supine to sit Min A and Mod A Requires adjustable bed with bedrail at home   TRANSFERS: Assistive device utilized:  Up-Walker   Sit to stand: SBA, CGA, and Min A Stand to sit: SBA, CGA, and Min A Chair to chair: CGA and Min A Floor:  Not assessed **Requires variable assist levels for transfers due to imbalance  RAMP:  Level of Assistance:    Assistive device utilized:  Ramp Comments: Not formally assessed, may benefit from testing in  future  CURB:  Level of Assistance:  Assistive device utilized:  Curb Comments: Not formally assessed, may benefit from testing in future  STAIRS: Level of Assistance:  06/12/23: Min A Stair Negotiation Technique: Forwards with Bilateral Rails  Number of Stairs: 4  Height of Stairs: 6inch  Comments: 06/12/23 - posterior LOB at top of steps when turning around, minor posterior lean throughout  GAIT: Gait pattern: step through pattern, decreased step length- Right, decreased step length- Left, decreased stride length, knee flexed in stance- Right, knee flexed in stance- Left, shuffling, festinating, trunk flexed, wide BOS, poor foot clearance- Right, and poor foot clearance- Left Distance walked: ~15ft Assistive device utilized:  Up-Walker Level of assistance: CGA and Min A Comments: Noticeable festination when turning, stepping back to sit, or side stepping  FUNCTIONAL TESTS:  5 times sit to stand: 22 seconds Timed up and go (TUG): 06/12/23: 29.40 seconds using Up-Walker with CGA/min A 10 meter walk test: 0.73m/s Berg Balance Scale: 20/56  PATIENT SURVEYS:  ABC scale 06/12/23: 30%  TREATMENT DATE:   06/28/23  Pt ambulating in/out of therapy clinic using his Up-Walker with close SBA for safety and 2x min A for balance. Pt with decreased safety awareness/anticipatory awareness by going to greet people, causing him to have a posterior LOB and needing physical assistance to remain upright.  Unless otherwise stated, CGA/min A was provided and gait belt donned in order to ensure pt safety throughout.  Performed manual therapy to address pt's R posterior hip/gluteal pain - upon palpation, pt with tenderness to touch along R piriformis - provided 2x trigger point release for 45seconds - provided soft tissue mobilization.  *Educated pt not to always put wallet in R  back pocket of jeans - pt recalls this and places his wallet in R front pant pocket.  Performed sidelying clamshells 2x10reps bilaterally with pt having significant weakness bilaterally (R>L) with this exercises. Added to pt's HEP. Requires mod cuing for proper form/technique.  Supine bridging with green level 3 resistance band around knees 2x10-15 reps - requires mod cuing for proper form/technique to maintain resistance against band while lifting/lowering his hips.  Gait training ~366ft, no UE support, with light min A for balance - more normal BOS, except when turning (still tangles feet up) causing balance instability/LOB. Backwards gait training ~29ft with pt achieving slight reciprocal stepping pattern although decreased step lengths and min decreased control of posterior momentum.   Posterior stepping strategy retraining:  - stared with standing large posterior steps x8reps per LE with varying CGA/min A for balance   - progressed to posterior lean into therapist's support with release of support on verbal command with pt to take a large posterior step to regain balance - pt with very poor motor planning with this requiring multiple attempts to take a large step, but then is unable to catch balance resulting in posterior shuffled steps requiring mod A to remain upright  *Pt will benefit from additional training of this  Pt continues to have R biased posterior lean/LOB bias that worsens with fatigue.  At end of session, pt reports reduction of R posterior hip/gluteal pain.  Provided updated HEP printout and level 3 green theraband to use for HEP.  PATIENT EDUCATION: Education details: PT POC, LTGs, findings on balance assessments today Person educated: Patient and Caregiver Angelica Education method: Explanation Education comprehension: verbalized understanding and needs further education  HOME EXERCISE PROGRAM:   Access Code: [email or web address] URL: [email or web address] Date:  06/28/2023 Prepared by: Casimiro Needle  Exercises - Walking  - 1 x daily - 7 x weekly - 2 sets - 3-5 minutes hold - Sit to Stand  - 1 x daily - 7 x weekly - 2 sets - 12-15 reps - Side Stepping with Resistance at Thighs and Counter Support  - 1 x daily - 7 x weekly - 3 sets - 10 reps - Supine Bridge with Resistance Band  - 1 x daily - 7 x weekly - 2 sets - 10-15 reps - Clamshell  - 1 x daily - 7 x weekly - 3 sets - 10 reps  GOALS: Goals reviewed with patient? Yes  SHORT TERM GOALS: Target date: 07/13/2023    Patient will be independent in home exercise program to improve strength/mobility for better functional independence with ADLs. Baseline: 06/20/2023: HEP provided 06/28/2023: updated Goal status: INITIAL  2. Patient will report <2 falls over past 6 weeks to demonstrate improved safety awareness at home.   Baseline: 06/01/2023 = 10+ falls in past 6 months  Goal Status: INITIAL  LONG TERM GOALS: Target date: 08/24/2023   1.  Patient (> 81 years old) will complete five times sit to stand test in < 15 seconds indicating an increased LE strength and improved balance. Baseline: 06/01/23: 22 seconds with posterior LOB Goal status: INITIAL  2.  Patient will increase Berg Balance score by > 6 points to demonstrate decreased fall risk during functional activities. Baseline: 06/01/23: 20/56 Goal status: INITIAL   3. Patient will reduce timed up and go to <11 seconds to reduce fall risk and demonstrate improved transfer/gait ability. Baseline: 06/12/23: 29.40 seconds using Up-Walker with CGA/min A Goal status: INITIAL  4.Patient will increase 10 meter walk test to >1.47m/s as to improve gait speed for better community ambulation and to reduce fall risk. Baseline: 06/01/23: 0.61m/s using Up-Walker Goal status: INITIAL  5.  Patient will require no more than supervision assist with ascending/descending 3 steps using handrails per home set-up.  Baseline: 06/12/23: requires min A using HR due to  posterior lean/LOB especially when turning at the top  Goal status: INITIAL   ASSESSMENT:  CLINICAL IMPRESSION:  Patient is a 73 y.o. male who was seen today for physical therapy treatment for imbalance due to Parkinson's disease. Patient with noticeable posterior lean bias as well as impaired cognition with difficulty sustaining attention in a distracting environment, impacting his balance recovery strategies. Pt also has impaired anticipatory awareness of when LOB will occur as well as delayed awareness of when LOB occurs resulting in delayed and insufficient recruitment of balance recovery strategies. Patient reporting increased R posterior hip pain today so manual therapy and strengthening exercises performed to address this with pt reporting a reduction in his pain by end of session. Patient participated in dynamic balance training today focused on backwards stepping strategy retraining. He requires consistent min A-mod A to remain upright due to repeated posterior lean/LOB. Pt demos improving balance recovery strategies today with ability to initiate hip strategy, but still has delayed and insufficient posterior stepping strategy to maintain upright with decreased assist.  Pt will benefit from skilled physical therapy to address his balance deficits in order to decrease his fall risk and promote increased independence with functional mobility and ADLs.   OBJECTIVE IMPAIRMENTS: Abnormal gait, decreased activity tolerance, decreased balance, decreased cognition, decreased endurance, decreased knowledge of condition, decreased knowledge of use of DME, decreased mobility, difficulty walking, decreased strength, decreased safety awareness, hypomobility, impaired flexibility, impaired sensation, improper body mechanics, postural dysfunction, and pain.   ACTIVITY LIMITATIONS: carrying, lifting, bending, standing, squatting, sleeping, stairs, transfers, bed mobility, continence, bathing, toileting,  dressing, reach over head, hygiene/grooming, and locomotion level  PARTICIPATION LIMITATIONS: meal prep, cleaning, laundry, medication management, driving, and community activity  PERSONAL FACTORS: Age, Sex, Time since onset of injury/illness/exacerbation, and 3+ comorbidities: Parkinson's, deep brain stim, depression, lipids, HTN, GERD, ED, dementia, seizures/falls, LBP/PSF, hx of lumbar fusion in 04/2022, hx of hip fx 08/2022, arthritis, frequent falls,  and OSA  are also affecting patient's functional outcome.   REHAB POTENTIAL: Good  CLINICAL DECISION MAKING: Evolving/moderate complexity  EVALUATION COMPLEXITY: Moderate  PLAN:  PT FREQUENCY: 1-2x/week  PT DURATION: 12 weeks  PLANNED INTERVENTIONS: 97164- PT Re-evaluation, 97110-Therapeutic exercises, 97530- Therapeutic activity, 97112- Neuromuscular re-education, 97535- Self Care, 16109- Manual therapy, [phone]- Gait training, [phone]- Orthotic Fit/training, [phone]- Electrical stimulation (manual), Patient/Family education, Balance training, Stair training, Taping, Joint mobilization, Joint manipulation, Spinal mobilization, Vestibular training, Cognitive remediation, DME instructions, Cryotherapy, and Moist heat  PLAN FOR NEXT SESSION:  Balance:   - posterior  stepping strategy training Dynamic Gait without AD:  - backwards walking  - quick/sudden turning  - Head rotations  Strengthening:  - reinforce R hip and gluteal strengthening - bridge with resistance - clamshells! - strengthening hips to create wider BOS for improved balance during gait    Jalecia Leon Verdell Face, PT, DPT, NCS, CSRS Physical Therapist - [place]  [facility]  2:30 PM 06/28/23

## 2023-06-30 NOTE — Progress Notes (Signed)
 Remote pacemaker transmission.

## 2023-06-30 NOTE — Addendum Note (Signed)
 Addended by: Elease Etienne A on: 06/30/2023 02:41 PM   Modules accepted: Orders

## 2023-07-04 ENCOUNTER — Ambulatory Visit: Payer: Medicare PPO | Admitting: Physical Therapy

## 2023-07-04 DIAGNOSIS — R296 Repeated falls: Secondary | ICD-10-CM | POA: Diagnosis not present

## 2023-07-04 DIAGNOSIS — R278 Other lack of coordination: Secondary | ICD-10-CM

## 2023-07-04 DIAGNOSIS — R262 Difficulty in walking, not elsewhere classified: Secondary | ICD-10-CM | POA: Diagnosis not present

## 2023-07-04 DIAGNOSIS — R2681 Unsteadiness on feet: Secondary | ICD-10-CM

## 2023-07-04 DIAGNOSIS — M6281 Muscle weakness (generalized): Secondary | ICD-10-CM | POA: Diagnosis not present

## 2023-07-04 DIAGNOSIS — R269 Unspecified abnormalities of gait and mobility: Secondary | ICD-10-CM | POA: Diagnosis not present

## 2023-07-04 DIAGNOSIS — R2689 Other abnormalities of gait and mobility: Secondary | ICD-10-CM | POA: Diagnosis not present

## 2023-07-04 NOTE — Therapy (Signed)
 OUTPATIENT PHYSICAL THERAPY NEURO TREATMENT   Patient Name: [name] MRN: [id number] DOB:October 26, 1950, 73 y.o., male Today's Date: 07/04/2023   PCP: Caffie Pinto, PA-C REFERRING PROVIDER: Suzy Bouchard, PA   END OF SESSION:    PT End of Session - 07/04/23 1308     Visit Number 8    Number of Visits 24    Date for PT Re-Evaluation 08/24/23    Authorization Type [employer] [id number]    PT Start Time 1315    PT Stop Time 1403    PT Time Calculation (min) 48 min    Equipment Utilized During Treatment Gait belt    Activity Tolerance Patient tolerated treatment well;No increased pain    Behavior During Therapy WFL for tasks assessed/performed;Flat affect                    Past Medical History:  Diagnosis Date   Anxiety disorder    Arthritis    Bradycardia    Cancer (HCC) 2013   skin cancer   Depression    ptsd   Dysrhythmia    chronic slow heart rate   GERD (gastroesophageal reflux disease)    H/O tachycardia-bradycardia syndrome    Headache(784.0)    tension headaches non recent   History of chicken pox    History of kidney stones    passed   Hypertension    treated with HCTZ   Major depressive disorder    Mild aortic regurgitation    Pacemaker    Parkinson's disease (HCC)    dx'ed 15 years ago   PTSD (post-traumatic stress disorder)    S/P deep brain stimulator placement    Shortness of breath dyspnea    Sleep apnea    doesn't use C-pap   Unspecified dementia, moderate, with psychotic disturbance (HCC)    Varicose veins    Past Surgical History:  Procedure Laterality Date   CATARACT EXTRACTION W/PHACO Right 01/17/2023   Procedure: CATARACT EXTRACTION PHACO AND INTRAOCULAR LENS PLACEMENT (IOC) RIGHT 6.33 00:42.6;  Surgeon: Galen Manila, MD;  Location: [facility];  Service: Ophthalmology;  Laterality: Right;   CATARACT EXTRACTION W/PHACO Left 02/07/2023   Procedure: CATARACT EXTRACTION PHACO AND INTRAOCULAR LENS PLACEMENT (IOC)  LEFT 6.35 00:38.3;  Surgeon: Galen Manila, MD;  Location: [facility];  Service: Ophthalmology;  Laterality: Left;   CHOLECYSTECTOMY N/A 10/22/2014   Procedure: LAPAROSCOPIC CHOLECYSTECTOMY WITH INTRAOPERATIVE CHOLANGIOGRAM;  Surgeon: Tiney Rouge III, MD;  Location: [facility];  Service: General;  Laterality: N/A;   cyst removed      from lip as a child   INTRAMEDULLARY (IM) NAIL INTERTROCHANTERIC N/A 02/18/2019   Procedure: INTRAMEDULLARY (IM) NAIL INTERTROCHANTRIC, RIGHT,;  Surgeon: Juanell Fairly, MD;  Location: [facility];  Service: Orthopedics;  Laterality: N/A;   LUMBAR LAMINECTOMY/DECOMPRESSION MICRODISCECTOMY Bilateral 12/14/2012   Procedure: Bilateral lumbar three-four, four-five decompressive laminotomy/foraminotomy;  Surgeon: Temple Pacini, MD;  Location: [facility];  Service: Neurosurgery;  Laterality: Bilateral;   PULSE GENERATOR IMPLANT Bilateral 12/13/2013   Procedure: Bilateral implantable pulse generator placement;  Surgeon: Maeola Harman, MD;  Location: [facility];  Service: Neurosurgery;  Laterality: Bilateral;  Bilateral implantable pulse generator placement   skin cancer removed     from ears,   12 lft arm  rt leg 15   SUBTHALAMIC STIMULATOR BATTERY REPLACEMENT Bilateral 07/14/2017   Procedure: BILATERAL IMPLANTED PULSE GENERATOR CHANGE FOR DEEP BRAIN STIMULATOR;  Surgeon: Maeola Harman, MD;  Location: [facility];  Service: Neurosurgery;  Laterality: Bilateral;   SUBTHALAMIC STIMULATOR INSERTION Bilateral 12/06/2013   Procedure: SUBTHALAMIC STIMULATOR INSERTION;  Surgeon: Maeola Harman, MD;  Location: [facility];  Service: Neurosurgery;  Laterality: Bilateral;  Bilateral deep brain stimulator placement   Patient Active Problem List   Diagnosis Date Noted   Hematuria 10/10/2021   Sinus drainage 12/09/2020   Medicare annual wellness visit, subsequent 11/18/2020   Pacemaker    Dementia associated with Parkinson's disease (HCC) 06/17/2020   Vertigo 02/19/2020   Urine  abnormality 02/19/2020   Dysphagia 02/23/2019   Fall at home 10/18/2017   REM behavioral disorder 11/02/2016   Radicular pain in right arm 10/09/2016   GERD (gastroesophageal reflux disease) 07/31/2016   Colon cancer screening 07/23/2015   Gout 02/18/2015   Depression 01/06/2015   Skin lesion 10/02/2014   Dupuytren's contracture 08/20/2014   Cough 07/21/2014   Lumbar stenosis with neurogenic claudication 06/13/2014   S/P deep brain stimulator placement 05/08/2014   Advance care planning 01/19/2014   Parkinson's disease (HCC) 12/13/2013   PTSD (post-traumatic stress disorder) 06/13/2013   Erectile dysfunction 06/13/2013   HLD (hyperlipidemia) 06/13/2013   Essential hypertension 06/03/2013   Bradycardia by electrocardiogram 06/03/2013   Obstructive sleep apnea 03/12/2013    ONSET DATE: ~15 years ago when pt was diagnosed with Parkinson's  REFERRING DIAG: G20.A1 (ICD-10-CM) - Parkinsons disease (HCC)   THERAPY DIAG:  Abnormality of gait and mobility  Unsteadiness on feet  Muscle weakness (generalized)  Repeated falls  Difficulty in walking, not elsewhere classified  Other abnormalities of gait and mobility  Other lack of coordination  Rationale for Evaluation and Treatment: Rehabilitation  SUBJECTIVE:                                                                                                                                                                                             SUBJECTIVE STATEMENT:   [name] reports she can tell pt is improving.  Pt and caregiver report he has performed his sit<>stands (12reps) and lateral side stepping exercise at home and was eager to do it.  Caregiver reports he cued pt to stand from recliner like he was doing during his exercise and it helped him stand up easier from the lower height of the recliner.  Pt reports R hip was "burning pretty steady" the rest of the day after last session. Pt reports R hip is hurting "a  little bit less" today compared to yesterday.   *Of note: pt requires increased time for verbal responses to therapist's questions and pt is easily distracted in busy environment  Pt accompanied by: self, male caregiver, and then  wife present at beginning and end of session  PERTINENT HISTORY: Parkinson's, deep brain stim, depression, lipids, HTN, GERD, ED, dementia, seizures/falls, LBP/PSF, hx of lumbar fusion in 04/2022, hx of hip fx 08/2022, arthritis, frequent falls,  and OSA   PAIN:  Are you having pain? Yes: NPRS scale: 7/10 with activity below, but otherwise 3/10 at rest  Pain location: L calvicle Pain description: ache to sharp Aggravating factors: when going to push-up to stand or control himself when sitting using armrest Relieving factors: rest and support in arm sling  PRECAUTIONS: Fall and Other:    frequent falls, most recent on 05/22/2023 with ED visit, L superolateral clavicular fx (out of sling on 06/15/23 due to pt/family discontinuing it), deep brain stimulator  RED FLAGS: Bowel or bladder incontinence: Yes: "on occasion" has incontinence    WEIGHT BEARING RESTRICTIONS: Yes unsure of specific L UE WBing restrictions, but pt currently using an AD for ambulation, while wearing basic arm sling  FALLS: Has patient fallen in last 6 months? Yes. Number of falls 10+  most recent on 05/22/2023 with injury to L shoulder  LIVING ENVIRONMENT: Lives with: lives with their spouse (wife, Radio broadcast assistant) and has hired caregivers Lives in: House/apartment Stairs: Yes: External: 2-3 steps; bilateral but cannot reach both (due to current home renovations, this is the entrance he uses) primarily uses R HR Has following equipment at home: Wheelchair (manual), shower chair, Grab bars, and Up-Walker  PLOF: Independent with household mobility with device, Requires assistive device for independence, Needs assistance with ADLs, and meal prep   Pt reports he frequently leaves his AD to the side when  going to get something, often forgetting it - he requires reinforced education on consistent use of U-step walker  PATIENT GOALS: Improve Balance  OBJECTIVE:  Note: Objective measures were completed at Evaluation unless otherwise noted.  DIAGNOSTIC FINDINGS:   CT CERVICAL SPINE WITHOUT CONTRAST on 05/22/2023 IMPRESSION: Disc levels: Disc space narrowing and spurring in the lower cervical spine. Mild bilateral degenerative facet disease, left greater than right. Degenerative disc and facet disease throughout the cervical spine. No acute bony abnormality.  CT HEAD WITHOUT CONTRAST on 05/22/2023 FINDINGS: Brain: Deep brain stimulators are noted with the tips in the region of the thalami bilaterally, unchanged. Mild atrophy. No acute intracranial abnormality. Specifically, no hemorrhage, hydrocephalus, mass lesion, acute infarction, or significant intracranial injury. Vascular: No hyperdense vessel or unexpected calcification. Skull: No acute calvarial abnormality. Sinuses/Orbits: No acute findings Other: None IMPRESSION: No acute intracranial abnormality.  LEFT SHOULDER - 2+ VIEW on 05/22/2023 IMPRESSION: Acute mildly displaced fracture of the lateral aspect of the left clavicle with intra-articular extension.   COGNITION: Overall cognitive status: History of cognitive impairments - at baseline and pt with very delayed verbal responses to subjective questioning and noted to be easily distracted in the therapy gym environment   SENSATION: Light touch: Impaired  and decreased distally in B LEs, but specifically decreased throughout R LE compared to L LE   COORDINATION: Not formally assessed  EDEMA:  Not formally assessed, but no significant swelling on visual exam  MUSCLE TONE: WFL  MUSCLE LENGTH: Hamstrings: Right ____ deg; Left ___ deg  *not assessed, but would benefit from testing  DTRs:  Not formally assessed  POSTURE: rounded shoulders, forward head,  decreased lumbar lordosis, and posterior pelvic tilt, posterior weight shift bias  LOWER EXTREMITY ROM:     Active  Not formally assessed Right Eval Left Eval  Hip flexion    Hip extension  Hip abduction    Hip adduction    Hip internal rotation    Hip external rotation    Knee flexion    Knee extension    Ankle dorsiflexion Would benefit from testing Would benefit from testing  Ankle plantarflexion    Ankle inversion    Ankle eversion     (Blank rows = not tested)  LOWER EXTREMITY MMT:    MMT Right Eval Left Eval  Hip flexion 4 4  Hip extension    Hip abduction    Hip adduction    Hip internal rotation    Hip external rotation    Knee flexion 4+ 5  Knee extension 4+ 4+  Ankle dorsiflexion 4- 4  Ankle plantarflexion 4 4-  Ankle inversion    Ankle eversion    (Blank rows = not tested)  Manual Muscle Test Scale 0/5 = No muscle contraction can be seen or felt 1/5 = Contraction can be felt, but there is no motion 2-/5 = Part moves through incomplete ROM w/ gravity decreased 2/5 = Part moves through complete ROM w/ gravity decreased 2+/5 = Part moves through incomplete ROM (<50%) against gravity or through complete ROM w/ gravity 3-/5 = Part moves through incomplete ROM (>50%) against gravity 3/5 = Part moves through complete ROM against gravity 3+/5 = Part moves through complete ROM against gravity/slight resistance 4-/5= Holds test position against slight to moderate pressure 4/5 = Part moves through complete ROM against gravity/moderate resistance 4+/5= Holds test position against moderate to strong pressure 5/5 = Part moves through complete ROM against gravity/full resistance  BED MOBILITY:  Sit to supine Min A and Mod A Supine to sit Min A and Mod A Requires adjustable bed with bedrail at home   TRANSFERS: Assistive device utilized:  Up-Walker   Sit to stand: SBA, CGA, and Min A Stand to sit: SBA, CGA, and Min A Chair to chair: CGA and Min A Floor:   Not assessed **Requires variable assist levels for transfers due to imbalance  RAMP:  Level of Assistance:    Assistive device utilized:  Ramp Comments: Not formally assessed, may benefit from testing in future  CURB:  Level of Assistance:  Assistive device utilized:  Curb Comments: Not formally assessed, may benefit from testing in future  STAIRS: Level of Assistance:  06/12/23: Min A Stair Negotiation Technique: Forwards with Bilateral Rails  Number of Stairs: 4  Height of Stairs: 6inch  Comments: 06/12/23 - posterior LOB at top of steps when turning around, minor posterior lean throughout  GAIT: Gait pattern: step through pattern, decreased step length- Right, decreased step length- Left, decreased stride length, knee flexed in stance- Right, knee flexed in stance- Left, shuffling, festinating, trunk flexed, wide BOS, poor foot clearance- Right, and poor foot clearance- Left Distance walked: ~132ft Assistive device utilized:  Up-Walker Level of assistance: CGA and Min A Comments: Noticeable festination when turning, stepping back to sit, or side stepping  FUNCTIONAL TESTS:  5 times sit to stand: 22 seconds Timed up and go (TUG): 06/12/23: 29.40 seconds using Up-Walker with CGA/min A 10 meter walk test: 0.61m/s Berg Balance Scale: 20/56  PATIENT SURVEYS:  ABC scale 06/12/23: 30%  TREATMENT DATE:   07/04/23  Pt ambulating in/out of therapy clinic using his Up-Walker with close SBA for safety.  Unless otherwise stated, CGA/min A was provided and gait belt donned in order to ensure pt safety throughout.  Gait training ~323ft, no UE support, with CGA/light min A for balance - more normal BOS, except when turning (still tangles feet up) causing balance instability/LOB - continues to have R hip instability during stance, but improving. Pt reports onset of  min R hip pain during this gait distance.   Supine bridging with green level 3 resistance band around knees 2x15 reps - requires mod cuing for proper form/technique in order to maintain resistance against band while lifting/lowering his hips - noticed R hip does not lift quite as high as L.  Performed sidelying clamshells 2x10reps bilaterally with pt having significant weakness bilaterally (R>L) with this exercises - progressed to adding level 2 red theraband resistance around knees. Requires mod cuing for proper form/technique.  Sit<>stands x8reps from EOM, with close SBA for safety, continues to require cuing to bring trunk forward and down when going to sit to avoid posterior LOB and sitting uncontrolled on mat.  Gait training additional 173ft , no AD, with light min A for balance - therapist facilitating increased R arm swing, but poor carryover once facilitation removed - continued to demo above gait deviations.  Reports R hip was "tense" during this gait cycle  Dynamic balance stepping training including:  - forward/backwards stepping over pole progressed to 1/2 foam role progressed to hurdle with pt maintaining balance with only light min A until end when becoming fatigued with taller obstacle - especially has difficulty stepping back leading with R LE due to it crossing/adducting back behind him  - side stepping over 1/2 foam roll with CGA/light min A for balance with decreased posterior LOB/lean noted today  Dynamic gait training with Blaze Pods set in zig-zag sequence to promote multiple turning for 2 min 30 seconds - requires CGA/light min A for safety/steadying and a few instances of heavier min A to maintain upright due to larger LOB, but pt performing better than anticipated with this activity. - pt would benefit from doing this but with a dual-task challenge    PATIENT EDUCATION: Education details: PT POC, LTGs, findings on balance assessments today Person educated: Patient and  Caregiver Angelica Education method: Explanation Education comprehension: verbalized understanding and needs further education  HOME EXERCISE PROGRAM:   Access Code: [email or web address] URL: [email or web address] Date: 06/28/2023 Prepared by: Casimiro Needle  Exercises - Walking  - 1 x daily - 7 x weekly - 2 sets - 3-5 minutes hold - Sit to Stand  - 1 x daily - 7 x weekly - 2 sets - 12-15 reps - Side Stepping with Resistance at Thighs and Counter Support  - 1 x daily - 7 x weekly - 3 sets - 10 reps - Supine Bridge with Resistance Band  - 1 x daily - 7 x weekly - 2 sets - 10-15 reps - Clamshell  - 1 x daily - 7 x weekly - 3 sets - 10 reps  GOALS: Goals reviewed with patient? Yes  SHORT TERM GOALS: Target date: 07/13/2023    Patient will be independent in home exercise program to improve strength/mobility for better functional independence with ADLs. Baseline: 06/20/2023: HEP provided 06/28/2023: updated Goal status: INITIAL  2. Patient will report <2 falls over past 6 weeks to demonstrate improved safety awareness at home.   Baseline: 06/01/2023 = 10+  falls in past 6 months  Goal Status: INITIAL  LONG TERM GOALS: Target date: 08/24/2023   1.  Patient (> 72 years old) will complete five times sit to stand test in < 15 seconds indicating an increased LE strength and improved balance. Baseline: 06/01/23: 22 seconds with posterior LOB Goal status: INITIAL  2.  Patient will increase Berg Balance score by > 6 points to demonstrate decreased fall risk during functional activities. Baseline: 06/01/23: 20/56 Goal status: INITIAL   3. Patient will reduce timed up and go to <11 seconds to reduce fall risk and demonstrate improved transfer/gait ability. Baseline: 06/12/23: 29.40 seconds using Up-Walker with CGA/min A Goal status: INITIAL  4.Patient will increase 10 meter walk test to >1.40m/s as to improve gait speed for better community ambulation and to reduce fall risk. Baseline:  06/01/23: 0.91m/s using Up-Walker Goal status: INITIAL  5.  Patient will require no more than supervision assist with ascending/descending 3 steps using handrails per home set-up.  Baseline: 06/12/23: requires min A using HR due to posterior lean/LOB especially when turning at the top  Goal status: INITIAL   ASSESSMENT:  CLINICAL IMPRESSION:  Patient is a 73 y.o. male who was seen today for physical therapy treatment for imbalance due to Parkinson's disease. Patient with noticeable posterior lean bias as well as impaired cognition with difficulty sustaining attention in a distracting environment, impacting his balance recovery strategies. Pt also has impaired anticipatory awareness of when LOB will occur as well as delayed awareness of when LOB occurs resulting in delayed and insufficient recruitment of balance recovery strategies. However, pt demos improvement in balance today with decreased posterior lean noted during above interventions. Pt continues to demonstrate R hip weakness during gait with instability noted during R stance phase. Performed supine and sidelying R hip strengthening exercises. Pt requires more consistent light min A during balance interventions today; however, would benefit from continued addition of dual-task challenges to determine how that impacts his balance. Pt will benefit from continued skilled physical therapy to further address his balance deficits in order to decrease his fall risk and promote increased independence with functional mobility and ADLs.   OBJECTIVE IMPAIRMENTS: Abnormal gait, decreased activity tolerance, decreased balance, decreased cognition, decreased endurance, decreased knowledge of condition, decreased knowledge of use of DME, decreased mobility, difficulty walking, decreased strength, decreased safety awareness, hypomobility, impaired flexibility, impaired sensation, improper body mechanics, postural dysfunction, and pain.   ACTIVITY LIMITATIONS:  carrying, lifting, bending, standing, squatting, sleeping, stairs, transfers, bed mobility, continence, bathing, toileting, dressing, reach over head, hygiene/grooming, and locomotion level  PARTICIPATION LIMITATIONS: meal prep, cleaning, laundry, medication management, driving, and community activity  PERSONAL FACTORS: Age, Sex, Time since onset of injury/illness/exacerbation, and 3+ comorbidities: Parkinson's, deep brain stim, depression, lipids, HTN, GERD, ED, dementia, seizures/falls, LBP/PSF, hx of lumbar fusion in 04/2022, hx of hip fx 08/2022, arthritis, frequent falls,  and OSA  are also affecting patient's functional outcome.   REHAB POTENTIAL: Good  CLINICAL DECISION MAKING: Evolving/moderate complexity  EVALUATION COMPLEXITY: Moderate  PLAN:  PT FREQUENCY: 1-2x/week  PT DURATION: 12 weeks  PLANNED INTERVENTIONS: 97164- PT Re-evaluation, 97110-Therapeutic exercises, 97530- Therapeutic activity, 97112- Neuromuscular re-education, 97535- Self Care, 16109- Manual therapy, [phone]- Gait training, [phone]- Orthotic Fit/training, [phone]- Electrical stimulation (manual), Patient/Family education, Balance training, Stair training, Taping, Joint mobilization, Joint manipulation, Spinal mobilization, Vestibular training, Cognitive remediation, DME instructions, Cryotherapy, and Moist heat  PLAN FOR NEXT SESSION:  *add dual-task challenges to interventions* Balance:   - posterior stepping strategy training  Dynamic Gait without AD:  - backwards walking  - quick/sudden turning  - Head rotations  Strengthening:  - reinforce R hip and gluteal strengthening - bridge with resistance - clamshells! - strengthening hips to create wider BOS for improved balance during gait    Madaleine Simmon Verdell Face, PT, DPT, NCS, CSRS Physical Therapist - [facility]  2:04 PM 07/04/23

## 2023-07-06 ENCOUNTER — Ambulatory Visit: Payer: Medicare PPO | Admitting: Physical Therapy

## 2023-07-11 ENCOUNTER — Ambulatory Visit: Payer: Medicare PPO | Admitting: Physical Therapy

## 2023-07-11 DIAGNOSIS — R262 Difficulty in walking, not elsewhere classified: Secondary | ICD-10-CM | POA: Diagnosis not present

## 2023-07-11 DIAGNOSIS — R269 Unspecified abnormalities of gait and mobility: Secondary | ICD-10-CM | POA: Diagnosis not present

## 2023-07-11 DIAGNOSIS — R278 Other lack of coordination: Secondary | ICD-10-CM | POA: Diagnosis not present

## 2023-07-11 DIAGNOSIS — R296 Repeated falls: Secondary | ICD-10-CM

## 2023-07-11 DIAGNOSIS — R2689 Other abnormalities of gait and mobility: Secondary | ICD-10-CM

## 2023-07-11 DIAGNOSIS — M6281 Muscle weakness (generalized): Secondary | ICD-10-CM

## 2023-07-11 DIAGNOSIS — R2681 Unsteadiness on feet: Secondary | ICD-10-CM

## 2023-07-11 NOTE — Therapy (Signed)
 OUTPATIENT PHYSICAL THERAPY NEURO TREATMENT   Patient Name: [name] MRN: [id number] DOB:08/31/1950, 73 y.o., male Today's Date: 07/11/2023   PCP: George Pinto, PA-C REFERRING PROVIDER: Suzy Bouchard, PA   END OF SESSION:    PT End of Session - 07/11/23 1401     Visit Number 9    Number of Visits 24    Date for PT Re-Evaluation 08/24/23    Authorization Type [employer] [id number]    PT Start Time 1402    PT Stop Time 1445    PT Time Calculation (min) 43 min    Equipment Utilized During Treatment Gait belt    Activity Tolerance Patient tolerated treatment well;No increased pain    Behavior During Therapy WFL for tasks assessed/performed;Flat affect             Past Medical History:  Diagnosis Date   Anxiety disorder    Arthritis    Bradycardia    Cancer (HCC) 2013   skin cancer   Depression    ptsd   Dysrhythmia    chronic slow heart rate   GERD (gastroesophageal reflux disease)    H/O tachycardia-bradycardia syndrome    Headache(784.0)    tension headaches non recent   History of chicken pox    History of kidney stones    passed   Hypertension    treated with HCTZ   Major depressive disorder    Mild aortic regurgitation    Pacemaker    Parkinson's disease (HCC)    dx'ed 15 years ago   PTSD (post-traumatic stress disorder)    S/P deep brain stimulator placement    Shortness of breath dyspnea    Sleep apnea    doesn't use C-pap   Unspecified dementia, moderate, with psychotic disturbance (HCC)    Varicose veins    Past Surgical History:  Procedure Laterality Date   CATARACT EXTRACTION W/PHACO Right 01/17/2023   Procedure: CATARACT EXTRACTION PHACO AND INTRAOCULAR LENS PLACEMENT (IOC) RIGHT 6.33 00:42.6;  Surgeon: Galen Manila, MD;  Location: [facility];  Service: Ophthalmology;  Laterality: Right;   CATARACT EXTRACTION W/PHACO Left 02/07/2023   Procedure: CATARACT EXTRACTION PHACO AND INTRAOCULAR LENS PLACEMENT (IOC) LEFT 6.35  00:38.3;  Surgeon: Galen Manila, MD;  Location: [facility];  Service: Ophthalmology;  Laterality: Left;   CHOLECYSTECTOMY N/A 10/22/2014   Procedure: LAPAROSCOPIC CHOLECYSTECTOMY WITH INTRAOPERATIVE CHOLANGIOGRAM;  Surgeon: Tiney Rouge III, MD;  Location: [facility];  Service: General;  Laterality: N/A;   cyst removed      from lip as a child   INTRAMEDULLARY (IM) NAIL INTERTROCHANTERIC N/A 02/18/2019   Procedure: INTRAMEDULLARY (IM) NAIL INTERTROCHANTRIC, RIGHT,;  Surgeon: Juanell Fairly, MD;  Location: [facility];  Service: Orthopedics;  Laterality: N/A;   LUMBAR LAMINECTOMY/DECOMPRESSION MICRODISCECTOMY Bilateral 12/14/2012   Procedure: Bilateral lumbar three-four, four-five decompressive laminotomy/foraminotomy;  Surgeon: Temple Pacini, MD;  Location: [facility];  Service: Neurosurgery;  Laterality: Bilateral;   PULSE GENERATOR IMPLANT Bilateral 12/13/2013   Procedure: Bilateral implantable pulse generator placement;  Surgeon: Maeola Harman, MD;  Location: [facility];  Service: Neurosurgery;  Laterality: Bilateral;  Bilateral implantable pulse generator placement   skin cancer removed     from ears,   12 lft arm  rt leg 15   SUBTHALAMIC STIMULATOR BATTERY REPLACEMENT Bilateral 07/14/2017   Procedure: BILATERAL IMPLANTED PULSE GENERATOR CHANGE FOR DEEP BRAIN STIMULATOR;  Surgeon: Maeola Harman, MD;  Location: [facility];  Service: Neurosurgery;  Laterality: Bilateral;   SUBTHALAMIC STIMULATOR INSERTION  Bilateral 12/06/2013   Procedure: SUBTHALAMIC STIMULATOR INSERTION;  Surgeon: Maeola Harman, MD;  Location: [facility];  Service: Neurosurgery;  Laterality: Bilateral;  Bilateral deep brain stimulator placement   Patient Active Problem List   Diagnosis Date Noted   Hematuria 10/10/2021   Sinus drainage 12/09/2020   Medicare annual wellness visit, subsequent 11/18/2020   Pacemaker    Dementia associated with Parkinson's disease (HCC) 06/17/2020   Vertigo 02/19/2020   Urine abnormality  02/19/2020   Dysphagia 02/23/2019   Fall at home 10/18/2017   REM behavioral disorder 11/02/2016   Radicular pain in right arm 10/09/2016   GERD (gastroesophageal reflux disease) 07/31/2016   Colon cancer screening 07/23/2015   Gout 02/18/2015   Depression 01/06/2015   Skin lesion 10/02/2014   Dupuytren's contracture 08/20/2014   Cough 07/21/2014   Lumbar stenosis with neurogenic claudication 06/13/2014   S/P deep brain stimulator placement 05/08/2014   Advance care planning 01/19/2014   Parkinson's disease (HCC) 12/13/2013   PTSD (post-traumatic stress disorder) 06/13/2013   Erectile dysfunction 06/13/2013   HLD (hyperlipidemia) 06/13/2013   Essential hypertension 06/03/2013   Bradycardia by electrocardiogram 06/03/2013   Obstructive sleep apnea 03/12/2013    ONSET DATE: ~15 years ago when pt was diagnosed with Parkinson's  REFERRING DIAG: G20.A1 (ICD-10-CM) - Parkinsons disease (HCC)   THERAPY DIAG:  Abnormality of gait and mobility  Unsteadiness on feet  Muscle weakness (generalized)  Repeated falls  Difficulty in walking, not elsewhere classified  Other abnormalities of gait and mobility  Other lack of coordination  Rationale for Evaluation and Treatment: Rehabilitation  SUBJECTIVE:                                                                                                                                                                                             SUBJECTIVE STATEMENT:  [name], pt's wife, reports pt had 1 fall since last visit. She states pt stood up and didn't use the chair or Up-Walker for balance, resulting in a fall. [name] reports when pt is standing, performing dressing tasks, he has gradual worsening crouch in B LEs causing balance instability (partially due to the dual-task challenge with decreased attention to his balance/posture).  [name] states pt still has difficulty remembering to use his Up-Walker and will occasionally have  it in front of him, but will move it to the side and walk around it.  [name] reports the frequency of pt's falls have lessened since starting physical therapy, with pt only having 1 fall in the past 2.5-3 weeks.   Pt reports he has been trying to get "acclimated to outside"  using his Up-Walker. Pt reports his R hip "still gives me some flare ups."   *Of note: pt requires increased time for verbal responses to therapist's questions and pt is easily distracted in busy environment  Pt accompanied by: self and his wife was present at beginning and end of session  PERTINENT HISTORY: Parkinson's, deep brain stim, depression, lipids, HTN, GERD, ED, dementia, seizures/falls, LBP/PSF, hx of lumbar fusion in 04/2022, hx of hip fx 08/2022, arthritis, frequent falls,  and OSA   PAIN:  Are you having pain? Yes: NPRS scale: 7/10 with activity below, but otherwise 3/10 at rest  Pain location: L calvicle Pain description: ache to sharp Aggravating factors: when going to push-up to stand or control himself when sitting using armrest Relieving factors: rest and support in arm sling  PRECAUTIONS: Fall and Other:    frequent falls, most recent on 05/22/2023 with ED visit, L superolateral clavicular fx (out of sling on 06/15/23 due to pt/family discontinuing it), deep brain stimulator  RED FLAGS: Bowel or bladder incontinence: Yes: "on occasion" has incontinence    WEIGHT BEARING RESTRICTIONS: Yes unsure of specific L UE WBing restrictions, but pt currently using an AD for ambulation, while wearing basic arm sling  FALLS: Has patient fallen in last 6 months? Yes. Number of falls 10+  most recent on 05/22/2023 with injury to L shoulder  LIVING ENVIRONMENT: Lives with: lives with their spouse (wife, Radio broadcast assistant) and has hired caregivers Lives in: House/apartment Stairs: Yes: External: 2-3 steps; bilateral but cannot reach both (due to current home renovations, this is the entrance he uses) primarily uses R HR Has  following equipment at home: Wheelchair (manual), shower chair, Grab bars, and Up-Walker  PLOF: Independent with household mobility with device, Requires assistive device for independence, Needs assistance with ADLs, and meal prep   Pt reports he frequently leaves his AD to the side when going to get something, often forgetting it - he requires reinforced education on consistent use of U-step walker  PATIENT GOALS: Improve Balance  OBJECTIVE:  Note: Objective measures were completed at Evaluation unless otherwise noted.  DIAGNOSTIC FINDINGS:   CT CERVICAL SPINE WITHOUT CONTRAST on 05/22/2023 IMPRESSION: Disc levels: Disc space narrowing and spurring in the lower cervical spine. Mild bilateral degenerative facet disease, left greater than right. Degenerative disc and facet disease throughout the cervical spine. No acute bony abnormality.  CT HEAD WITHOUT CONTRAST on 05/22/2023 FINDINGS: Brain: Deep brain stimulators are noted with the tips in the region of the thalami bilaterally, unchanged. Mild atrophy. No acute intracranial abnormality. Specifically, no hemorrhage, hydrocephalus, mass lesion, acute infarction, or significant intracranial injury. Vascular: No hyperdense vessel or unexpected calcification. Skull: No acute calvarial abnormality. Sinuses/Orbits: No acute findings Other: None IMPRESSION: No acute intracranial abnormality.  LEFT SHOULDER - 2+ VIEW on 05/22/2023 IMPRESSION: Acute mildly displaced fracture of the lateral aspect of the left clavicle with intra-articular extension.   COGNITION: Overall cognitive status: History of cognitive impairments - at baseline and pt with very delayed verbal responses to subjective questioning and noted to be easily distracted in the therapy gym environment   SENSATION: Light touch: Impaired  and decreased distally in B LEs, but specifically decreased throughout R LE compared to L LE   COORDINATION: Not formally  assessed  EDEMA:  Not formally assessed, but no significant swelling on visual exam  MUSCLE TONE: WFL  MUSCLE LENGTH: Hamstrings: Right ____ deg; Left ___ deg  *not assessed, but would benefit from testing  DTRs:  Not  formally assessed  POSTURE: rounded shoulders, forward head, decreased lumbar lordosis, and posterior pelvic tilt, posterior weight shift bias  LOWER EXTREMITY ROM:     Active  Not formally assessed Right Eval Left Eval  Hip flexion    Hip extension    Hip abduction    Hip adduction    Hip internal rotation    Hip external rotation    Knee flexion    Knee extension    Ankle dorsiflexion Would benefit from testing Would benefit from testing  Ankle plantarflexion    Ankle inversion    Ankle eversion     (Blank rows = not tested)  LOWER EXTREMITY MMT:    MMT Right Eval Left Eval  Hip flexion 4 4  Hip extension    Hip abduction    Hip adduction    Hip internal rotation    Hip external rotation    Knee flexion 4+ 5  Knee extension 4+ 4+  Ankle dorsiflexion 4- 4  Ankle plantarflexion 4 4-  Ankle inversion    Ankle eversion    (Blank rows = not tested)  Manual Muscle Test Scale 0/5 = No muscle contraction can be seen or felt 1/5 = Contraction can be felt, but there is no motion 2-/5 = Part moves through incomplete ROM w/ gravity decreased 2/5 = Part moves through complete ROM w/ gravity decreased 2+/5 = Part moves through incomplete ROM (<50%) against gravity or through complete ROM w/ gravity 3-/5 = Part moves through incomplete ROM (>50%) against gravity 3/5 = Part moves through complete ROM against gravity 3+/5 = Part moves through complete ROM against gravity/slight resistance 4-/5= Holds test position against slight to moderate pressure 4/5 = Part moves through complete ROM against gravity/moderate resistance 4+/5= Holds test position against moderate to strong pressure 5/5 = Part moves through complete ROM against gravity/full  resistance  BED MOBILITY:  Sit to supine Min A and Mod A Supine to sit Min A and Mod A Requires adjustable bed with bedrail at home   TRANSFERS: Assistive device utilized:  Up-Walker   Sit to stand: SBA, CGA, and Min A Stand to sit: SBA, CGA, and Min A Chair to chair: CGA and Min A Floor:  Not assessed **Requires variable assist levels for transfers due to imbalance  RAMP:  Level of Assistance:    Assistive device utilized:  Ramp Comments: Not formally assessed, may benefit from testing in future  CURB:  Level of Assistance:  Assistive device utilized:  Curb Comments: Not formally assessed, may benefit from testing in future  STAIRS: Level of Assistance:  06/12/23: Min A Stair Negotiation Technique: Forwards with Bilateral Rails  Number of Stairs: 4  Height of Stairs: 6inch  Comments: 06/12/23 - posterior LOB at top of steps when turning around, minor posterior lean throughout  GAIT: Gait pattern: step through pattern, decreased step length- Right, decreased step length- Left, decreased stride length, knee flexed in stance- Right, knee flexed in stance- Left, shuffling, festinating, trunk flexed, wide BOS, poor foot clearance- Right, and poor foot clearance- Left Distance walked: ~113ft Assistive device utilized:  Up-Walker Level of assistance: CGA and Min A Comments: Noticeable festination when turning, stepping back to sit, or side stepping  FUNCTIONAL TESTS:  5 times sit to stand: 22 seconds Timed up and go (TUG): 06/12/23: 29.40 seconds using Up-Walker with CGA/min A 10 meter walk test: 0.39m/s Berg Balance Scale: 20/56  PATIENT SURVEYS:  ABC scale 06/12/23: 30%  TREATMENT DATE:   07/11/23  Therapist reinforces education to pt on importance of using his Up-Walker to maintain his balance, increase his safety, and decrease his fall risk.  Educated pt that only performing gait training without AD during therapy to challenge his balance.   Pt ambulating in/out of therapy clinic using his Up-Walker with close SBA for safety.  Unless otherwise stated, CGA/min A was provided and gait belt donned in order to ensure pt safety throughout.  Gait training ~369ft, no UE support, with light progressed to heavy min A for balance and 1x max A due to L LOB when distracted and pt scissoring causing him to trip over his feet - pt with narrower BOS causing more balance instability throughout gait today as well as gradual worsening into a crouched posture with hip/knee flexion and narrower BOS - continues to have R hip instability during stance, but improving. No reports of R hip pain.   Side stepping with level 3 green tband resistance around thighs ~8reps each direction down/back x3 reps with consistent heavy min/light mod A for balance due to persistent posterior lean throughout.  Dynamic stepping balance training including the following with dual-task throughout of Blaze Pods placed at shoulder hight on mirror to tap with UEs (2 pods set on sequence to alternate):  - side stepping over 1/2 foam roll - 44min30sec - 19hits - fwd/backwards stepping over 1/2 foam roll -81min30sec - 17 hits (swapped the sides of the Blaze Pods at half way point) *requires consistent min A with occasional mod A to maintain upright due to primarily posterior LOB  Sit<>stands from EOM 2x12 reps with level 3 green theraband around knees for increased hip abductor activation - cued pt not to sit fully each rep, but remain partially standing for increased muscle activation and pt successful with this. Pt reports feeling "muscle burn" with this. Requires light min A for balance due to minor posterior lean, cuing for increased anterior trunk lean.  Pt appears to have overall increased fatigue today with more of a crouched gait - pt's wife confirms he has been up a lot today and  likely is tired.    PATIENT EDUCATION: Education details: PT POC, LTGs, findings on balance assessments today Person educated: Patient and Caregiver Angelica Education method: Explanation Education comprehension: verbalized understanding and needs further education  HOME EXERCISE PROGRAM:   Access Code: [email or web address] URL: [email or web address] Date: 06/28/2023 Prepared by: Casimiro Needle  Exercises - Walking  - 1 x daily - 7 x weekly - 2 sets - 3-5 minutes hold - Sit to Stand  - 1 x daily - 7 x weekly - 2 sets - 12-15 reps - Side Stepping with Resistance at Thighs and Counter Support  - 1 x daily - 7 x weekly - 3 sets - 10 reps - Supine Bridge with Resistance Band  - 1 x daily - 7 x weekly - 2 sets - 10-15 reps - Clamshell  - 1 x daily - 7 x weekly - 3 sets - 10 reps  GOALS: Goals reviewed with patient? Yes  SHORT TERM GOALS: Target date: 07/13/2023    Patient will be independent in home exercise program to improve strength/mobility for better functional independence with ADLs. Baseline: 06/20/2023: HEP provided 06/28/2023: updated Goal status: INITIAL  2. Patient will report <2 falls over past 6 weeks to demonstrate improved safety awareness at home.   Baseline: 06/01/2023 = 10+ falls in past 6 months  Goal Status: INITIAL  LONG TERM GOALS: Target  date: 08/24/2023   1.  Patient (> 75 years old) will complete five times sit to stand test in < 15 seconds indicating an increased LE strength and improved balance. Baseline: 06/01/23: 22 seconds with posterior LOB Goal status: INITIAL  2.  Patient will increase Berg Balance score by > 6 points to demonstrate decreased fall risk during functional activities. Baseline: 06/01/23: 20/56 Goal status: INITIAL   3. Patient will reduce timed up and go to <11 seconds to reduce fall risk and demonstrate improved transfer/gait ability. Baseline: 06/12/23: 29.40 seconds using Up-Walker with CGA/min A Goal status: INITIAL  4.Patient  will increase 10 meter walk test to >1.66m/s as to improve gait speed for better community ambulation and to reduce fall risk. Baseline: 06/01/23: 0.31m/s using Up-Walker Goal status: INITIAL  5.  Patient will require no more than supervision assist with ascending/descending 3 steps using handrails per home set-up.  Baseline: 06/12/23: requires min A using HR due to posterior lean/LOB especially when turning at the top  Goal status: INITIAL   ASSESSMENT:  CLINICAL IMPRESSION:  Patient is a 73 y.o. male who was seen today for physical therapy treatment for imbalance due to Parkinson's disease. Patient with noticeable posterior lean bias as well as impaired cognition with difficulty sustaining attention in a distracting environment, and/or difficulty with any dual-task challenge, impacting his balance recovery strategies. Pt demonstrates increased fatigue throughout session today resulting in more of a crouched posture throughout gait and balance tasks as well as a more persistent posterior lean with greater delay in balance recovery strategies. [name] confirms pt likely tired today. Pt continues to demonstrate R hip weakness during gait with instability noted during R stance phase. Educated pt and wife on importance of continuing to perform supine and sidelying R hip strengthening exercises as assigned on HEP. Pt would benefit from continued addition of dual-task challenges during dynamic balance interventions to improve his safety with ADLs and overall functional mobility. Pt will benefit from continued skilled physical therapy to further address his balance deficits in order to decrease his fall risk and promote increased independence with functional mobility and ADLs.   OBJECTIVE IMPAIRMENTS: Abnormal gait, decreased activity tolerance, decreased balance, decreased cognition, decreased endurance, decreased knowledge of condition, decreased knowledge of use of DME, decreased mobility, difficulty  walking, decreased strength, decreased safety awareness, hypomobility, impaired flexibility, impaired sensation, improper body mechanics, postural dysfunction, and pain.   ACTIVITY LIMITATIONS: carrying, lifting, bending, standing, squatting, sleeping, stairs, transfers, bed mobility, continence, bathing, toileting, dressing, reach over head, hygiene/grooming, and locomotion level  PARTICIPATION LIMITATIONS: meal prep, cleaning, laundry, medication management, driving, and community activity  PERSONAL FACTORS: Age, Sex, Time since onset of injury/illness/exacerbation, and 3+ comorbidities: Parkinson's, deep brain stim, depression, lipids, HTN, GERD, ED, dementia, seizures/falls, LBP/PSF, hx of lumbar fusion in 04/2022, hx of hip fx 08/2022, arthritis, frequent falls,  and OSA  are also affecting patient's functional outcome.   REHAB POTENTIAL: Good  CLINICAL DECISION MAKING: Evolving/moderate complexity  EVALUATION COMPLEXITY: Moderate  PLAN:  PT FREQUENCY: 1-2x/week  PT DURATION: 12 weeks  PLANNED INTERVENTIONS: 97164- PT Re-evaluation, 97110-Therapeutic exercises, 97530- Therapeutic activity, 97112- Neuromuscular re-education, 97535- Self Care, 16109- Manual therapy, [phone]- Gait training, [phone]- Orthotic Fit/training, [phone]- Electrical stimulation (manual), Patient/Family education, Balance training, Stair training, Taping, Joint mobilization, Joint manipulation, Spinal mobilization, Vestibular training, Cognitive remediation, DME instructions, Cryotherapy, and Moist heat  PLAN FOR NEXT SESSION:  *progress note next visit - re-test OMs *add dual-task challenges to interventions*  Balance:   -  posterior stepping strategy training Dynamic Gait without AD:  - backwards walking  - quick/sudden turning  - Head rotations  Strengthening:  - reinforce R hip and gluteal strengthening - bridge with resistance - clamshells! - strengthening hips to create wider BOS for improved balance  during gait    Heavyn Yearsley Verdell Face, PT, DPT, NCS, CSRS Physical Therapist - [facility]  [facility]  2:48 PM 07/11/23

## 2023-07-13 ENCOUNTER — Encounter: Payer: Self-pay | Admitting: Physical Therapy

## 2023-07-13 ENCOUNTER — Ambulatory Visit: Payer: Medicare PPO

## 2023-07-13 DIAGNOSIS — R269 Unspecified abnormalities of gait and mobility: Secondary | ICD-10-CM

## 2023-07-13 DIAGNOSIS — R278 Other lack of coordination: Secondary | ICD-10-CM | POA: Diagnosis not present

## 2023-07-13 DIAGNOSIS — R296 Repeated falls: Secondary | ICD-10-CM

## 2023-07-13 DIAGNOSIS — R2689 Other abnormalities of gait and mobility: Secondary | ICD-10-CM | POA: Diagnosis not present

## 2023-07-13 DIAGNOSIS — R262 Difficulty in walking, not elsewhere classified: Secondary | ICD-10-CM | POA: Diagnosis not present

## 2023-07-13 DIAGNOSIS — M6281 Muscle weakness (generalized): Secondary | ICD-10-CM | POA: Diagnosis not present

## 2023-07-13 DIAGNOSIS — R2681 Unsteadiness on feet: Secondary | ICD-10-CM | POA: Diagnosis not present

## 2023-07-13 NOTE — Therapy (Signed)
 OUTPATIENT PHYSICAL THERAPY NEURO TREATMENT/Physical Therapy Progress Note   Dates of reporting period  06/01/23   to   07/13/23   Patient Name: [name] MRN: [id number] DOB:September 20, 1950, 73 y.o., male Today's Date: 07/13/2023   PCP: Caffie Pinto, PA-C REFERRING PROVIDER: Suzy Bouchard, PA   END OF SESSION:    PT End of Session - 07/13/23 1358     Visit Number 10    Number of Visits 24    Date for PT Re-Evaluation 08/24/23    Authorization Type [employer] [id number]    PT Start Time 1401    PT Stop Time 1442    PT Time Calculation (min) 41 min    Equipment Utilized During Treatment Gait belt    Activity Tolerance Patient tolerated treatment well;No increased pain    Behavior During Therapy WFL for tasks assessed/performed;Flat affect              Past Medical History:  Diagnosis Date   Anxiety disorder    Arthritis    Bradycardia    Cancer (HCC) 2013   skin cancer   Depression    ptsd   Dysrhythmia    chronic slow heart rate   GERD (gastroesophageal reflux disease)    H/O tachycardia-bradycardia syndrome    Headache(784.0)    tension headaches non recent   History of chicken pox    History of kidney stones    passed   Hypertension    treated with HCTZ   Major depressive disorder    Mild aortic regurgitation    Pacemaker    Parkinson's disease (HCC)    dx'ed 15 years ago   PTSD (post-traumatic stress disorder)    S/P deep brain stimulator placement    Shortness of breath dyspnea    Sleep apnea    doesn't use C-pap   Unspecified dementia, moderate, with psychotic disturbance (HCC)    Varicose veins    Past Surgical History:  Procedure Laterality Date   CATARACT EXTRACTION W/PHACO Right 01/17/2023   Procedure: CATARACT EXTRACTION PHACO AND INTRAOCULAR LENS PLACEMENT (IOC) RIGHT 6.33 00:42.6;  Surgeon: Galen Manila, MD;  Location: [facility];  Service: Ophthalmology;  Laterality: Right;   CATARACT EXTRACTION W/PHACO Left 02/07/2023    Procedure: CATARACT EXTRACTION PHACO AND INTRAOCULAR LENS PLACEMENT (IOC) LEFT 6.35 00:38.3;  Surgeon: Galen Manila, MD;  Location: [facility];  Service: Ophthalmology;  Laterality: Left;   CHOLECYSTECTOMY N/A 10/22/2014   Procedure: LAPAROSCOPIC CHOLECYSTECTOMY WITH INTRAOPERATIVE CHOLANGIOGRAM;  Surgeon: Tiney Rouge III, MD;  Location: [facility];  Service: General;  Laterality: N/A;   cyst removed      from lip as a child   INTRAMEDULLARY (IM) NAIL INTERTROCHANTERIC N/A 02/18/2019   Procedure: INTRAMEDULLARY (IM) NAIL INTERTROCHANTRIC, RIGHT,;  Surgeon: Juanell Fairly, MD;  Location: [facility];  Service: Orthopedics;  Laterality: N/A;   LUMBAR LAMINECTOMY/DECOMPRESSION MICRODISCECTOMY Bilateral 12/14/2012   Procedure: Bilateral lumbar three-four, four-five decompressive laminotomy/foraminotomy;  Surgeon: Temple Pacini, MD;  Location: [facility];  Service: Neurosurgery;  Laterality: Bilateral;   PULSE GENERATOR IMPLANT Bilateral 12/13/2013   Procedure: Bilateral implantable pulse generator placement;  Surgeon: Maeola Harman, MD;  Location: [facility];  Service: Neurosurgery;  Laterality: Bilateral;  Bilateral implantable pulse generator placement   skin cancer removed     from ears,   12 lft arm  rt leg 15   SUBTHALAMIC STIMULATOR BATTERY REPLACEMENT Bilateral 07/14/2017   Procedure: BILATERAL IMPLANTED PULSE GENERATOR CHANGE FOR DEEP BRAIN STIMULATOR;  Surgeon:  Maeola Harman, MD;  Location: [facility];  Service: Neurosurgery;  Laterality: Bilateral;   SUBTHALAMIC STIMULATOR INSERTION Bilateral 12/06/2013   Procedure: SUBTHALAMIC STIMULATOR INSERTION;  Surgeon: Maeola Harman, MD;  Location: [facility];  Service: Neurosurgery;  Laterality: Bilateral;  Bilateral deep brain stimulator placement   Patient Active Problem List   Diagnosis Date Noted   Hematuria 10/10/2021   Sinus drainage 12/09/2020   Medicare annual wellness visit, subsequent 11/18/2020   Pacemaker    Dementia associated  with Parkinson's disease (HCC) 06/17/2020   Vertigo 02/19/2020   Urine abnormality 02/19/2020   Dysphagia 02/23/2019   Fall at home 10/18/2017   REM behavioral disorder 11/02/2016   Radicular pain in right arm 10/09/2016   GERD (gastroesophageal reflux disease) 07/31/2016   Colon cancer screening 07/23/2015   Gout 02/18/2015   Depression 01/06/2015   Skin lesion 10/02/2014   Dupuytren's contracture 08/20/2014   Cough 07/21/2014   Lumbar stenosis with neurogenic claudication 06/13/2014   S/P deep brain stimulator placement 05/08/2014   Advance care planning 01/19/2014   Parkinson's disease (HCC) 12/13/2013   PTSD (post-traumatic stress disorder) 06/13/2013   Erectile dysfunction 06/13/2013   HLD (hyperlipidemia) 06/13/2013   Essential hypertension 06/03/2013   Bradycardia by electrocardiogram 06/03/2013   Obstructive sleep apnea 03/12/2013    ONSET DATE: ~15 years ago when pt was diagnosed with Parkinson's  REFERRING DIAG: G20.A1 (ICD-10-CM) - Parkinsons disease (HCC)   THERAPY DIAG:  Abnormality of gait and mobility  Unsteadiness on feet  Muscle weakness (generalized)  Repeated falls  Difficulty in walking, not elsewhere classified  Rationale for Evaluation and Treatment: Rehabilitation  SUBJECTIVE:                                                                                                                                                                                             SUBJECTIVE STATEMENT: Patient denies fall since last session. Patient reports back and shoulder pain.    *Of note: pt requires increased time for verbal responses to therapist's questions and pt is easily distracted in busy environment  Pt accompanied by: self and his wife was present at beginning and end of session  PERTINENT HISTORY: Parkinson's, deep brain stim, depression, lipids, HTN, GERD, ED, dementia, seizures/falls, LBP/PSF, hx of lumbar fusion in 04/2022, hx of hip fx  08/2022, arthritis, frequent falls,  and OSA   PAIN:  Are you having pain? Yes: NPRS scale: 7/10 with activity below, but otherwise 3/10 at rest  Pain location: L calvicle Pain description: ache to sharp Aggravating factors: when going to push-up to stand or control himself when sitting using armrest Relieving  factors: rest and support in arm sling  PRECAUTIONS: Fall and Other:    frequent falls, most recent on 05/22/2023 with ED visit, L superolateral clavicular fx (out of sling on 06/15/23 due to pt/family discontinuing it), deep brain stimulator  RED FLAGS: Bowel or bladder incontinence: Yes: "on occasion" has incontinence    WEIGHT BEARING RESTRICTIONS: Yes unsure of specific L UE WBing restrictions, but pt currently using an AD for ambulation, while wearing basic arm sling  FALLS: Has patient fallen in last 6 months? Yes. Number of falls 10+  most recent on 05/22/2023 with injury to L shoulder  LIVING ENVIRONMENT: Lives with: lives with their spouse (wife, Radio broadcast assistant) and has hired caregivers Lives in: House/apartment Stairs: Yes: External: 2-3 steps; bilateral but cannot reach both (due to current home renovations, this is the entrance he uses) primarily uses R HR Has following equipment at home: Wheelchair (manual), shower chair, Grab bars, and Up-Ahmyah Gidley  PLOF: Independent with household mobility with device, Requires assistive device for independence, Needs assistance with ADLs, and meal prep   Pt reports he frequently leaves his AD to the side when going to get something, often forgetting it - he requires reinforced education on consistent use of U-step Merrit Waugh  PATIENT GOALS: Improve Balance  OBJECTIVE:  Note: Objective measures were completed at Evaluation unless otherwise noted.  DIAGNOSTIC FINDINGS:   CT CERVICAL SPINE WITHOUT CONTRAST on 05/22/2023 IMPRESSION: Disc levels: Disc space narrowing and spurring in the lower cervical spine. Mild bilateral degenerative facet  disease, left greater than right. Degenerative disc and facet disease throughout the cervical spine. No acute bony abnormality.  CT HEAD WITHOUT CONTRAST on 05/22/2023 FINDINGS: Brain: Deep brain stimulators are noted with the tips in the region of the thalami bilaterally, unchanged. Mild atrophy. No acute intracranial abnormality. Specifically, no hemorrhage, hydrocephalus, mass lesion, acute infarction, or significant intracranial injury. Vascular: No hyperdense vessel or unexpected calcification. Skull: No acute calvarial abnormality. Sinuses/Orbits: No acute findings Other: None IMPRESSION: No acute intracranial abnormality.  LEFT SHOULDER - 2+ VIEW on 05/22/2023 IMPRESSION: Acute mildly displaced fracture of the lateral aspect of the left clavicle with intra-articular extension.   COGNITION: Overall cognitive status: History of cognitive impairments - at baseline and pt with very delayed verbal responses to subjective questioning and noted to be easily distracted in the therapy gym environment   SENSATION: Light touch: Impaired  and decreased distally in B LEs, but specifically decreased throughout R LE compared to L LE   COORDINATION: Not formally assessed  EDEMA:  Not formally assessed, but no significant swelling on visual exam  MUSCLE TONE: WFL  MUSCLE LENGTH: Hamstrings: Right ____ deg; Left ___ deg  *not assessed, but would benefit from testing  DTRs:  Not formally assessed  POSTURE: rounded shoulders, forward head, decreased lumbar lordosis, and posterior pelvic tilt, posterior weight shift bias  LOWER EXTREMITY ROM:     Active  Not formally assessed Right Eval Left Eval  Hip flexion    Hip extension    Hip abduction    Hip adduction    Hip internal rotation    Hip external rotation    Knee flexion    Knee extension    Ankle dorsiflexion Would benefit from testing Would benefit from testing  Ankle plantarflexion    Ankle inversion    Ankle  eversion     (Blank rows = not tested)  LOWER EXTREMITY MMT:    MMT Right Eval Left Eval  Hip flexion 4 4  Hip  extension    Hip abduction    Hip adduction    Hip internal rotation    Hip external rotation    Knee flexion 4+ 5  Knee extension 4+ 4+  Ankle dorsiflexion 4- 4  Ankle plantarflexion 4 4-  Ankle inversion    Ankle eversion    (Blank rows = not tested)  Manual Muscle Test Scale 0/5 = No muscle contraction can be seen or felt 1/5 = Contraction can be felt, but there is no motion 2-/5 = Part moves through incomplete ROM w/ gravity decreased 2/5 = Part moves through complete ROM w/ gravity decreased 2+/5 = Part moves through incomplete ROM (<50%) against gravity or through complete ROM w/ gravity 3-/5 = Part moves through incomplete ROM (>50%) against gravity 3/5 = Part moves through complete ROM against gravity 3+/5 = Part moves through complete ROM against gravity/slight resistance 4-/5= Holds test position against slight to moderate pressure 4/5 = Part moves through complete ROM against gravity/moderate resistance 4+/5= Holds test position against moderate to strong pressure 5/5 = Part moves through complete ROM against gravity/full resistance  BED MOBILITY:  Sit to supine Min A and Mod A Supine to sit Min A and Mod A Requires adjustable bed with bedrail at home   TRANSFERS: Assistive device utilized:  Up-Saori Umholtz   Sit to stand: SBA, CGA, and Min A Stand to sit: SBA, CGA, and Min A Chair to chair: CGA and Min A Floor:  Not assessed **Requires variable assist levels for transfers due to imbalance  RAMP:  Level of Assistance:    Assistive device utilized:  Ramp Comments: Not formally assessed, may benefit from testing in future  CURB:  Level of Assistance:  Assistive device utilized:  Curb Comments: Not formally assessed, may benefit from testing in future  STAIRS: Level of Assistance:  06/12/23: Min A Stair Negotiation Technique: Forwards with  Bilateral Rails  Number of Stairs: 4  Height of Stairs: 6inch  Comments: 06/12/23 - posterior LOB at top of steps when turning around, minor posterior lean throughout  GAIT: Gait pattern: step through pattern, decreased step length- Right, decreased step length- Left, decreased stride length, knee flexed in stance- Right, knee flexed in stance- Left, shuffling, festinating, trunk flexed, wide BOS, poor foot clearance- Right, and poor foot clearance- Left Distance walked: ~151ft Assistive device utilized:  Up-Cadence Haslam Level of assistance: CGA and Min A Comments: Noticeable festination when turning, stepping back to sit, or side stepping  FUNCTIONAL TESTS:  5 times sit to stand: 22 seconds Timed up and go (TUG): 06/12/23: 29.40 seconds using Up-Madolyn Ackroyd with CGA/min A 10 meter walk test: 0.60m/s Berg Balance Scale: 20/56  PATIENT SURVEYS:  ABC scale 06/12/23: 30%                                                                                                                              TREATMENT DATE:  07/13/23   Physical therapy treatment session today  consisted of completing assessment of goals and administration of testing as demonstrated and documented in flow sheet, treatment, and goals section of this note. Addition treatments may be found below.   5xSTS: 11.51 seconds with no UE support  BERG: 23/56 TUG: 22.29 seconds - posterior LOB : 0.89 m/s with Up Macklin Jacquin 3 stair ascent with rail: minA with HR due to posterior LOB on first step   Unless otherwise stated, CGA/min A was provided and gait belt donned in order to ensure pt safety throughout.  Sit to stand from green chair 2 x 12 with GTB around knees for increased hip abductor activation - significant posterior LOB on last rep of each set - cues for anterior weight shift ("nose over toes")  Overground ambulation with Up Auburn Hert x 2 laps around gym (~300')    PATIENT EDUCATION: Education details: PT POC, LTGs, findings on  balance assessments today Person educated: Patient and Caregiver Angelica Education method: Explanation Education comprehension: verbalized understanding and needs further education  HOME EXERCISE PROGRAM:   Access Code: [email or web address] URL: [email or web address] Date: 06/28/2023 Prepared by: Casimiro Needle  Exercises - Walking  - 1 x daily - 7 x weekly - 2 sets - 3-5 minutes hold - Sit to Stand  - 1 x daily - 7 x weekly - 2 sets - 12-15 reps - Side Stepping with Resistance at Thighs and Counter Support  - 1 x daily - 7 x weekly - 3 sets - 10 reps - Supine Bridge with Resistance Band  - 1 x daily - 7 x weekly - 2 sets - 10-15 reps - Clamshell  - 1 x daily - 7 x weekly - 3 sets - 10 reps  GOALS: Goals reviewed with patient? Yes  SHORT TERM GOALS: Target date: 07/13/2023    Patient will be independent in home exercise program to improve strength/mobility for better functional independence with ADLs. Baseline: 06/20/2023: HEP provided 06/28/2023: updated Goal status: ONGOING   2. Patient will report <2 falls over past 6 weeks to demonstrate improved safety awareness at home.   Baseline: 06/01/2023 = 10+ falls in past 6 months; 3/27: 10+ falls per patient report in past 6 weeks   Goal Status: ONGOING  LONG TERM GOALS: Target date: 08/24/2023   1.  Patient (> 31 years old) will complete five times sit to stand test in < 15 seconds indicating an increased LE strength and improved balance. Baseline: 06/01/23: 22 seconds with posterior LOB; 3/27: 11.51 seconds Goal status: MET  2.  Patient will increase Berg Balance score by > 6 points to demonstrate decreased fall risk during functional activities. Baseline: 06/01/23: 20/56; 3/27: 23/56 Goal status: ONGOING   3. Patient will reduce timed up and go to <11 seconds to reduce fall risk and demonstrate improved transfer/gait ability. Baseline: 06/12/23: 29.40 seconds using Up-Jamal Haskin with CGA/min A; 3/27: 22.29 seconds with Up-Daesia Zylka  and posterior LOB upon standing  Goal status: ONGOING  4.Patient will increase 10 meter walk test to >1.71m/s as to improve gait speed for better community ambulation and to reduce fall risk. Baseline: 06/01/23: 0.38m/s using Up-Bonnee Zertuche; 3/27: 0.89 m/s using Up Jackey Housey Goal status: ONGOING  5.  Patient will require no more than supervision assist with ascending/descending 3 steps using handrails per home set-up.  Baseline: 06/12/23: requires min A using HR due to posterior lean/LOB especially when turning at the top; 3/27: minA required using HR due to posterior LOB on first step   Goal status: ONGOING   ASSESSMENT:  CLINICAL IMPRESSION:    Patient arrives to treatment session motivated to participate with no falls since last session. Progress note completed this date with patient meeting TUG goal and improved in all other goals. Continues to have frequent posterior LOBs requiring assist to prevent falling. Patient's condition has the potential to improve in response to therapy. Maximum improvement is yet to be obtained. The anticipated improvement is attainable and reasonable in a generally predictable time.Pt will benefit from continued skilled physical therapy to further address his balance deficits in order to decrease his fall risk and promote increased independence with functional mobility and ADLs.   OBJECTIVE IMPAIRMENTS: Abnormal gait, decreased activity tolerance, decreased balance, decreased cognition, decreased endurance, decreased knowledge of condition, decreased knowledge of use of DME, decreased mobility, difficulty walking, decreased strength, decreased safety awareness, hypomobility, impaired flexibility, impaired sensation, improper body mechanics, postural dysfunction, and pain.   ACTIVITY LIMITATIONS: carrying, lifting, bending, standing, squatting, sleeping, stairs, transfers, bed mobility, continence, bathing, toileting, dressing, reach over head, hygiene/grooming, and locomotion  level  PARTICIPATION LIMITATIONS: meal prep, cleaning, laundry, medication management, driving, and community activity  PERSONAL FACTORS: Age, Sex, Time since onset of injury/illness/exacerbation, and 3+ comorbidities: Parkinson's, deep brain stim, depression, lipids, HTN, GERD, ED, dementia, seizures/falls, LBP/PSF, hx of lumbar fusion in 04/2022, hx of hip fx 08/2022, arthritis, frequent falls,  and OSA  are also affecting patient's functional outcome.   REHAB POTENTIAL: Good  CLINICAL DECISION MAKING: Evolving/moderate complexity  EVALUATION COMPLEXITY: Moderate  PLAN:  PT FREQUENCY: 1-2x/week  PT DURATION: 12 weeks  PLANNED INTERVENTIONS: 97164- PT Re-evaluation, 97110-Therapeutic exercises, 97530- Therapeutic activity, 97112- Neuromuscular re-education, 97535- Self Care, 95621- Manual therapy, [phone]- Gait training, [phone]- Orthotic Fit/training, [phone]- Electrical stimulation (manual), Patient/Family education, Balance training, Stair training, Taping, Joint mobilization, Joint manipulation, Spinal mobilization, Vestibular training, Cognitive remediation, DME instructions, Cryotherapy, and Moist heat  PLAN FOR NEXT SESSION:  *add dual-task challenges to interventions*  Balance:   - posterior stepping strategy training Dynamic Gait without AD:  - backwards walking  - quick/sudden turning  - Head rotations  Strengthening:  - reinforce R hip and gluteal strengthening - bridge with resistance - clamshells! - strengthening hips to create wider BOS for improved balance during gait    Viviann Spare, PT, DPT Physical Therapist - [facility]  [facility]  1:59 PM 07/13/23

## 2023-07-18 ENCOUNTER — Ambulatory Visit (INDEPENDENT_AMBULATORY_CARE_PROVIDER_SITE_OTHER): Payer: Medicare PPO | Admitting: Psychology

## 2023-07-18 DIAGNOSIS — F4323 Adjustment disorder with mixed anxiety and depressed mood: Secondary | ICD-10-CM

## 2023-07-18 DIAGNOSIS — F331 Major depressive disorder, recurrent, moderate: Secondary | ICD-10-CM | POA: Diagnosis not present

## 2023-07-18 NOTE — Progress Notes (Unsigned)
 [facility] Counselor/Therapist Progress Note  Patient ID: [name], MRN: [id number],    Date: 07/18/2023  Time Spent: 47 minutes  Time in:  4:02  Time out:  4:49  Treatment Type: Indivdual  Reported Symptoms: sadness, and boredom  Mental Status Exam: Appearance:  Casual     Behavior: normal  Motor: Psychomotor Retardation  Speech/Language:  Slow  Affect: flat  Mood: agitated  Thought process: concrete  Thought content:   WNL  Sensory/Perceptual disturbances:   WNL  Orientation: oriented to person, place, time/date, and situation  Attention: Fair  Concentration: Fair  Memory: Immediate;   Fair  [employer] of knowledge:  Fair  Insight:   Fair  Judgment:  Fair  Impulse Control: Fair   Risk Assessment: Danger to Self:  No Self-injurious Behavior: No Danger to Others: No Duty to Warn:no Physical Aggression / Violence:No  Access to Firearms a concern: No  Gang Involvement:No   Subjective: The patient attended a face-to-face individual therapy session  via video visit today.  The patient gave verbal consent for the video to be on Careg[facility] and his wife is aware of limitations of telehealth.  The patient was in his home  and therapist was in the office.  The patient presents with a flat affect and mood is somewhat agitated.  The patient's wife reported that his doctor decreased his medications.  She states that they decreased his antidepressant because they felt like it was making him too sleepy.  We talked about the patient being aggravated because he is not able to do the things he used to do.  His wife reports that he is asking frequently to go to the [facility] so that he can get his license.  I attempted to reframe things for the patient because he is not really capable right now of being able to drive.  His reflexes are very slow and his memory is really poor.  I suggested to his wife that he may just need to go and be told by the [facility] that he cannot get his license back  if he continues to press her to take him.  The patient is sad that he has this illness but he also has a tendency to get stuck on 1 thing and struggle with with changing that narrative.   Interventions: Ego-Supportive, CBT, Social skills training, problem solving  Diagnosis:Major depressive disorder, recurrent episode, moderate (HCC)  Adjustment disorder with mixed anxiety and depressed mood  Plan: Client Abilities/Strengths  Pt is bright and has a good sense of humor.  Client Treatment Preferences  Individual therapy.  Client Statement of Needs  improve coping skills regarding chronic illness.  Treatment Level  Outpatient Individual therapy  Symptoms  Depressed or irritable mood.: No Description Entered (Status: improved). Diminished interest in or  enjoyment of activities.: No Description Entered (Status: maintained). Feelings of hopelessness,  worthlessness, or inappropriate guilt.: No Description Entered (Status: improved). Lack of energy.: No  Description Entered (Status: maintained). Psychomotor agitation or retardation.: No Description  Entered (Status: maintained). Social withdrawal.: No Description Entered (Status: improved).  Problems Addressed  Unipolar Depression, Unipolar Depression, Unipolar Depression, Unipolar Depression  Goals 1. Alleviate depressive symptoms and return to previous level of effective  functioning. 2. Develop healthy interpersonal relationships that lead to the alleviation  and help prevent the relapse of depression. Objective Describe current and past experiences with depression including their impact on functioning and  attempts to resolve it. Target Date: 2024-04-30 Frequency: Monthly Progress: 60 Modality: individual  Related Interventions 1. Encourage the client to share his/her thoughts and feelings of depression; express empathy and  build rapport while identifying primary cognitive, behavioral, interpersonal, or other  contributors to  depression. Objective Learn and implement behavioral strategies to overcome depression. Target Date: 2024-04-30 Frequency: Monthly Progress: 80 Modality: individual Objective Identify and replace thoughts and beliefs that support depression. Target Date: 2024-04-30 Frequency: Monthly Progress: 70 Modality: individual Related Interventions 1. Conduct Cognitive-Behavioral Therapy (see Cognitive Behavior Therapy by Reola Calkins; Overcoming Depression by Agapito Games al.), beginning with helping the client learn the connection among  cognition, depressive feelings, and actions. 2. Explore and restructure underlying assumptions and beliefs reflected in biased self-talk that  may put the client at risk for relapse or recurrence. 3. Facilitate and reinforce the client's shift from biased depressive self-talk and beliefs to realitybased cognitive messages that enhance self-confidence and increase adaptive actions (see  "Positive Self-Talk" in the Adult Psychotherapy Homework Planner by Stephannie Li). Objective Learn and implement problem-solving and decision-making skills. Target Date: 2024-04-30 Frequency: Monthly Progress: 70 Modality: individual Related Interventions 1. Encourage in the client the development of a positive problem orientation in which problems  and solving them are viewed as a natural part of life and not something to be feared, despaired,  or avoided. 2. Conduct Problem-Solving Therapy (see Problem-Solving Therapy by Domenick Bookbinder and Rob Hickman) using techniques such as psychoeducation, modeling, and role-playing to teach client problem-solving skills (i.e., defining a problem specifically, generating possible solutions, evaluating the pros  and cons of each solution, selecting and implementing a plan of action, evaluating the efficacy  of the plan, accepting or revising the plan); role-play application of the problem-solving skill to  a real life issue (or assign "Applying Problem-Solving to  Interpersonal Conflict" in the Adult  Psychotherapy Homework Planner by Stephannie Li). 3. Develop healthy thinking patterns and beliefs about self, others, and the world that lead to the alleviation and help prevent the relapse of  depression. 4. Recognize, accept, and cope with feelings of depression. Diagnosis Axis  none F43.21 (Adjustment Disorder, With depressed mood) - Open -  [Signifier: n/a]  Adjustment Disorder,  With Depressed Mood  Axis  none 296.32 (Major depressive affective disorder, recurrent episode,  moderate) - Open - [Signifier: n/a]  Conditions For Discharge Achievement of treatment goals and objectives   Plan is to continue to offer supportive therapy to patient to help him deal with his depression related to his Parkinsons diagnosis.  Patient approved Treatment Plan   Luddie Boghosian Harrel Lemon, LCSW

## 2023-07-20 ENCOUNTER — Ambulatory Visit: Attending: Physician Assistant | Admitting: Physical Therapy

## 2023-07-20 DIAGNOSIS — R269 Unspecified abnormalities of gait and mobility: Secondary | ICD-10-CM | POA: Insufficient documentation

## 2023-07-20 DIAGNOSIS — R2681 Unsteadiness on feet: Secondary | ICD-10-CM | POA: Diagnosis not present

## 2023-07-20 DIAGNOSIS — R2689 Other abnormalities of gait and mobility: Secondary | ICD-10-CM | POA: Diagnosis not present

## 2023-07-20 DIAGNOSIS — R278 Other lack of coordination: Secondary | ICD-10-CM | POA: Diagnosis not present

## 2023-07-20 DIAGNOSIS — M6281 Muscle weakness (generalized): Secondary | ICD-10-CM | POA: Insufficient documentation

## 2023-07-20 DIAGNOSIS — R262 Difficulty in walking, not elsewhere classified: Secondary | ICD-10-CM | POA: Diagnosis not present

## 2023-07-20 DIAGNOSIS — R296 Repeated falls: Secondary | ICD-10-CM | POA: Diagnosis not present

## 2023-07-20 DIAGNOSIS — G20A1 Parkinson's disease without dyskinesia, without mention of fluctuations: Secondary | ICD-10-CM | POA: Insufficient documentation

## 2023-07-20 NOTE — Therapy (Signed)
 OUTPATIENT PHYSICAL THERAPY NEURO TREATMENT   Patient Name: [name] MRN: [id number] DOB:05/21/1950, 73 y.o., male Today's Date: 07/20/2023   PCP: Caffie Pinto, PA-C REFERRING PROVIDER: Suzy Bouchard, PA   END OF SESSION:    PT End of Session - 07/20/23 1016     Visit Number 11    Number of Visits 24    Date for PT Re-Evaluation 08/24/23    Authorization Type [employer] [id number]    PT Start Time 1016    PT Stop Time 1100    PT Time Calculation (min) 44 min    Equipment Utilized During Treatment Gait belt    Activity Tolerance Patient tolerated treatment well;No increased pain    Behavior During Therapy WFL for tasks assessed/performed;Flat affect               Past Medical History:  Diagnosis Date   Anxiety disorder    Arthritis    Bradycardia    Cancer (HCC) 2013   skin cancer   Depression    ptsd   Dysrhythmia    chronic slow heart rate   GERD (gastroesophageal reflux disease)    H/O tachycardia-bradycardia syndrome    Headache(784.0)    tension headaches non recent   History of chicken pox    History of kidney stones    passed   Hypertension    treated with HCTZ   Major depressive disorder    Mild aortic regurgitation    Pacemaker    Parkinson's disease (HCC)    dx'ed 15 years ago   PTSD (post-traumatic stress disorder)    S/P deep brain stimulator placement    Shortness of breath dyspnea    Sleep apnea    doesn't use C-pap   Unspecified dementia, moderate, with psychotic disturbance (HCC)    Varicose veins    Past Surgical History:  Procedure Laterality Date   CATARACT EXTRACTION W/PHACO Right 01/17/2023   Procedure: CATARACT EXTRACTION PHACO AND INTRAOCULAR LENS PLACEMENT (IOC) RIGHT 6.33 00:42.6;  Surgeon: Galen Manila, MD;  Location: [facility];  Service: Ophthalmology;  Laterality: Right;   CATARACT EXTRACTION W/PHACO Left 02/07/2023   Procedure: CATARACT EXTRACTION PHACO AND INTRAOCULAR LENS PLACEMENT (IOC) LEFT 6.35  00:38.3;  Surgeon: Galen Manila, MD;  Location: [facility];  Service: Ophthalmology;  Laterality: Left;   CHOLECYSTECTOMY N/A 10/22/2014   Procedure: LAPAROSCOPIC CHOLECYSTECTOMY WITH INTRAOPERATIVE CHOLANGIOGRAM;  Surgeon: Tiney Rouge III, MD;  Location: [facility];  Service: General;  Laterality: N/A;   cyst removed      from lip as a child   INTRAMEDULLARY (IM) NAIL INTERTROCHANTERIC N/A 02/18/2019   Procedure: INTRAMEDULLARY (IM) NAIL INTERTROCHANTRIC, RIGHT,;  Surgeon: Juanell Fairly, MD;  Location: [facility];  Service: Orthopedics;  Laterality: N/A;   LUMBAR LAMINECTOMY/DECOMPRESSION MICRODISCECTOMY Bilateral 12/14/2012   Procedure: Bilateral lumbar three-four, four-five decompressive laminotomy/foraminotomy;  Surgeon: Temple Pacini, MD;  Location: [facility];  Service: Neurosurgery;  Laterality: Bilateral;   PULSE GENERATOR IMPLANT Bilateral 12/13/2013   Procedure: Bilateral implantable pulse generator placement;  Surgeon: Maeola Harman, MD;  Location: [facility];  Service: Neurosurgery;  Laterality: Bilateral;  Bilateral implantable pulse generator placement   skin cancer removed     from ears,   12 lft arm  rt leg 15   SUBTHALAMIC STIMULATOR BATTERY REPLACEMENT Bilateral 07/14/2017   Procedure: BILATERAL IMPLANTED PULSE GENERATOR CHANGE FOR DEEP BRAIN STIMULATOR;  Surgeon: Maeola Harman, MD;  Location: [facility];  Service: Neurosurgery;  Laterality: Bilateral;   SUBTHALAMIC  STIMULATOR INSERTION Bilateral 12/06/2013   Procedure: SUBTHALAMIC STIMULATOR INSERTION;  Surgeon: Maeola Harman, MD;  Location: [facility];  Service: Neurosurgery;  Laterality: Bilateral;  Bilateral deep brain stimulator placement   Patient Active Problem List   Diagnosis Date Noted   Hematuria 10/10/2021   Sinus drainage 12/09/2020   Medicare annual wellness visit, subsequent 11/18/2020   Pacemaker    Dementia associated with Parkinson's disease (HCC) 06/17/2020   Vertigo 02/19/2020   Urine abnormality  02/19/2020   Dysphagia 02/23/2019   Fall at home 10/18/2017   REM behavioral disorder 11/02/2016   Radicular pain in right arm 10/09/2016   GERD (gastroesophageal reflux disease) 07/31/2016   Colon cancer screening 07/23/2015   Gout 02/18/2015   Depression 01/06/2015   Skin lesion 10/02/2014   Dupuytren's contracture 08/20/2014   Cough 07/21/2014   Lumbar stenosis with neurogenic claudication 06/13/2014   S/P deep brain stimulator placement 05/08/2014   Advance care planning 01/19/2014   Parkinson's disease (HCC) 12/13/2013   PTSD (post-traumatic stress disorder) 06/13/2013   Erectile dysfunction 06/13/2013   HLD (hyperlipidemia) 06/13/2013   Essential hypertension 06/03/2013   Bradycardia by electrocardiogram 06/03/2013   Obstructive sleep apnea 03/12/2013    ONSET DATE: ~15 years ago when pt was diagnosed with Parkinson's  REFERRING DIAG: G20.A1 (ICD-10-CM) - Parkinsons disease (HCC)   THERAPY DIAG:  Abnormality of gait and mobility  Unsteadiness on feet  Muscle weakness (generalized)  Repeated falls  Difficulty in walking, not elsewhere classified  Other abnormalities of gait and mobility  Other lack of coordination  Rationale for Evaluation and Treatment: Rehabilitation  SUBJECTIVE:                                                                                                                                                                                             SUBJECTIVE STATEMENT:  Pt reports he is doing alright, but he still isn't fully awake yet this morning. Pt reports he has been having trouble with his back, stating he believes it is from getting up and down a lot.  [name] arrived and states pt fell on Monday of this week and hit his back on the fireplace, but he hadn't had a fall in 3 weeks prior to this. Landed on his back with some scrapes, but no other injuries.   *Of note: pt requires increased time for verbal responses to therapist's  questions and pt is easily distracted in busy environment  Pt accompanied by: self and his wife was present at beginning and end of session  PERTINENT HISTORY: Parkinson's, deep brain stim, depression, lipids, HTN, GERD, ED, dementia, seizures/falls,  LBP/PSF, hx of lumbar fusion in 04/2022, hx of hip fx 08/2022, arthritis, frequent falls,  and OSA   PAIN:  Are you having pain? Yes: NPRS scale: 7/10 with activity below, but otherwise 3/10 at rest  Pain location: L calvicle Pain description: ache to sharp Aggravating factors: when going to push-up to stand or control himself when sitting using armrest Relieving factors: rest and support in arm sling  PRECAUTIONS: Fall and Other:    frequent falls, most recent on 05/22/2023 with ED visit, L superolateral clavicular fx (out of sling on 06/15/23 due to pt/family discontinuing it), deep brain stimulator  RED FLAGS: Bowel or bladder incontinence: Yes: "on occasion" has incontinence    WEIGHT BEARING RESTRICTIONS: Yes unsure of specific L UE WBing restrictions, but pt currently using an AD for ambulation, while wearing basic arm sling  FALLS: Has patient fallen in last 6 months? Yes. Number of falls 10+  most recent on 05/22/2023 with injury to L shoulder  LIVING ENVIRONMENT: Lives with: lives with their spouse (wife, Radio broadcast assistant) and has hired caregivers Lives in: House/apartment Stairs: Yes: External: 2-3 steps; bilateral but cannot reach both (due to current home renovations, this is the entrance he uses) primarily uses R HR Has following equipment at home: Wheelchair (manual), shower chair, Grab bars, and Up-Walker  PLOF: Independent with household mobility with device, Requires assistive device for independence, Needs assistance with ADLs, and meal prep   Pt reports he frequently leaves his AD to the side when going to get something, often forgetting it - he requires reinforced education on consistent use of U-step walker  PATIENT GOALS: Improve  Balance  OBJECTIVE:  Note: Objective measures were completed at Evaluation unless otherwise noted.  DIAGNOSTIC FINDINGS:   CT CERVICAL SPINE WITHOUT CONTRAST on 05/22/2023 IMPRESSION: Disc levels: Disc space narrowing and spurring in the lower cervical spine. Mild bilateral degenerative facet disease, left greater than right. Degenerative disc and facet disease throughout the cervical spine. No acute bony abnormality.  CT HEAD WITHOUT CONTRAST on 05/22/2023 FINDINGS: Brain: Deep brain stimulators are noted with the tips in the region of the thalami bilaterally, unchanged. Mild atrophy. No acute intracranial abnormality. Specifically, no hemorrhage, hydrocephalus, mass lesion, acute infarction, or significant intracranial injury. Vascular: No hyperdense vessel or unexpected calcification. Skull: No acute calvarial abnormality. Sinuses/Orbits: No acute findings Other: None IMPRESSION: No acute intracranial abnormality.  LEFT SHOULDER - 2+ VIEW on 05/22/2023 IMPRESSION: Acute mildly displaced fracture of the lateral aspect of the left clavicle with intra-articular extension.   COGNITION: Overall cognitive status: History of cognitive impairments - at baseline and pt with very delayed verbal responses to subjective questioning and noted to be easily distracted in the therapy gym environment   SENSATION: Light touch: Impaired  and decreased distally in B LEs, but specifically decreased throughout R LE compared to L LE   COORDINATION: Not formally assessed  EDEMA:  Not formally assessed, but no significant swelling on visual exam  MUSCLE TONE: WFL  MUSCLE LENGTH: Hamstrings: Right ____ deg; Left ___ deg  *not assessed, but would benefit from testing  DTRs:  Not formally assessed  POSTURE: rounded shoulders, forward head, decreased lumbar lordosis, and posterior pelvic tilt, posterior weight shift bias  LOWER EXTREMITY ROM:     Active  Not formally assessed  Right Eval Left Eval  Hip flexion    Hip extension    Hip abduction    Hip adduction    Hip internal rotation    Hip external rotation  Knee flexion    Knee extension    Ankle dorsiflexion Would benefit from testing Would benefit from testing  Ankle plantarflexion    Ankle inversion    Ankle eversion     (Blank rows = not tested)  LOWER EXTREMITY MMT:    MMT Right Eval Left Eval  Hip flexion 4 4  Hip extension    Hip abduction    Hip adduction    Hip internal rotation    Hip external rotation    Knee flexion 4+ 5  Knee extension 4+ 4+  Ankle dorsiflexion 4- 4  Ankle plantarflexion 4 4-  Ankle inversion    Ankle eversion    (Blank rows = not tested)  Manual Muscle Test Scale 0/5 = No muscle contraction can be seen or felt 1/5 = Contraction can be felt, but there is no motion 2-/5 = Part moves through incomplete ROM w/ gravity decreased 2/5 = Part moves through complete ROM w/ gravity decreased 2+/5 = Part moves through incomplete ROM (<50%) against gravity or through complete ROM w/ gravity 3-/5 = Part moves through incomplete ROM (>50%) against gravity 3/5 = Part moves through complete ROM against gravity 3+/5 = Part moves through complete ROM against gravity/slight resistance 4-/5= Holds test position against slight to moderate pressure 4/5 = Part moves through complete ROM against gravity/moderate resistance 4+/5= Holds test position against moderate to strong pressure 5/5 = Part moves through complete ROM against gravity/full resistance  BED MOBILITY:  Sit to supine Min A and Mod A Supine to sit Min A and Mod A Requires adjustable bed with bedrail at home   TRANSFERS: Assistive device utilized:  Up-Walker   Sit to stand: SBA, CGA, and Min A Stand to sit: SBA, CGA, and Min A Chair to chair: CGA and Min A Floor:  Not assessed **Requires variable assist levels for transfers due to imbalance  RAMP:  Level of Assistance:    Assistive device utilized:   Ramp Comments: Not formally assessed, may benefit from testing in future  CURB:  Level of Assistance:  Assistive device utilized:  Curb Comments: Not formally assessed, may benefit from testing in future  STAIRS: Level of Assistance:  06/12/23: Min A Stair Negotiation Technique: Forwards with Bilateral Rails  Number of Stairs: 4  Height of Stairs: 6inch  Comments: 06/12/23 - posterior LOB at top of steps when turning around, minor posterior lean throughout  GAIT: Gait pattern: step through pattern, decreased step length- Right, decreased step length- Left, decreased stride length, knee flexed in stance- Right, knee flexed in stance- Left, shuffling, festinating, trunk flexed, wide BOS, poor foot clearance- Right, and poor foot clearance- Left Distance walked: ~119ft Assistive device utilized:  Up-Walker Level of assistance: CGA and Min A Comments: Noticeable festination when turning, stepping back to sit, or side stepping  FUNCTIONAL TESTS:  5 times sit to stand: 22 seconds Timed up and go (TUG): 06/12/23: 29.40 seconds using Up-Walker with CGA/min A 10 meter walk test: 0.65m/s Berg Balance Scale: 20/56  PATIENT SURVEYS:  ABC scale 06/12/23: 30%  TREATMENT DATE:  07/20/23  Pt arrived to therapy in transport chair today.   L stand pivot transport chair>EOM, no AD, with heavy min A today due to posterior lean with pt arching through his back causing his lean to start at his shoulders.  Gait training ~256ft, no AD, with light min A for balance - continues to have narrow BOS, but not has significant as prior sessions, balance instability due to hip instability - performed dual-task challenge of head rotations to visually identify targets on wall with min increase in balance instability with this.  Supine bridge progression as follows:  - normal technique x12  reps - added level 3 green theraband resistance around knees x15 reps - keeping tband, added 5lb weight across hips x15 reps Cuing for proper form/technique - noticed pt with difficulty keeping R knee abducted demoing hip weakness  Repeated sit>stands from EOM with level 3 green theraband around knees and 5lb dumbbells in each hand at shoulders, with focus on increased anterior trunk lean while rising and avoiding arching back once in standing causing posterior LOB - requires CGA to heavy min A to maintain balance with max cuing to keep R knee out to activate hip abductors and for proper sequencing of anterior trunk flexion - 2x10reps - improvement of form and balance on 2nd set  Dynamic stepping balance challenges as follows:  Forward/backward sstepping over 1/2 foam roll with 4 blaze pods surrounding pt on the sequence setting - 2 on ground in front and 2 on tables behind him to promote foot tapping and trunk rotation for hand tapping - 32min30sec reaching 22 hits - requires up to heavy mod A for R posterior LOB, especially when stepping backwards and when rotating trunk to tap R posterior pod on the table Broke down the task to only having the 2 pods on pt's R side for him to step forward/back over 1/2 foam roll while tapping front blaze pod on ground with his foot and back blaze pod on table with his R hand promoting R trunk rotation - progressed to more occasional min A for balance rather than consistent - cuing to step R foot back and laterally to prevent R posterior LOB  R foot forward/backwards foot taps over 1/2 foam roll to [employer] with focus on targeting R postero-lateral step when bringing his foot back over foam roll with level 1 yellow tband resistance - 45 hits in 30 sec - requires heavy min A progressed to lighter min A when pt able to motor plan L wt shift and maintaining upright posture (wants to lean trunk to R)  Pt would benefit from continuation of this exercise   PATIENT  EDUCATION: Education details: PT POC, LTGs, findings on balance assessments today Person educated: Patient and Caregiver Angelica Education method: Explanation Education comprehension: verbalized understanding and needs further education  HOME EXERCISE PROGRAM:   Access Code: [email or web address] URL: [email or web address] Date: 06/28/2023 Prepared by: Casimiro Needle  Exercises - Walking  - 1 x daily - 7 x weekly - 2 sets - 3-5 minutes hold - Sit to Stand  - 1 x daily - 7 x weekly - 2 sets - 12-15 reps - Side Stepping with Resistance at Thighs and Counter Support  - 1 x daily - 7 x weekly - 3 sets - 10 reps - Supine Bridge with Resistance Band  - 1 x daily - 7 x weekly - 2 sets - 10-15 reps - Clamshell  - 1 x daily - 7  x weekly - 3 sets - 10 reps  GOALS: Goals reviewed with patient? Yes  SHORT TERM GOALS: Target date: 07/13/2023    Patient will be independent in home exercise program to improve strength/mobility for better functional independence with ADLs. Baseline: 06/20/2023: HEP provided 06/28/2023: updated Goal status: ONGOING   2. Patient will report <2 falls over past 6 weeks to demonstrate improved safety awareness at home.   Baseline: 06/01/2023 = 10+ falls in past 6 months; 3/27: 10+ falls per patient report in past 6 weeks   Goal Status: ONGOING  LONG TERM GOALS: Target date: 08/24/2023   1.  Patient (> 43 years old) will complete five times sit to stand test in < 15 seconds indicating an increased LE strength and improved balance. Baseline: 06/01/23: 22 seconds with posterior LOB; 3/27: 11.51 seconds Goal status: MET  2.  Patient will increase Berg Balance score by > 6 points to demonstrate decreased fall risk during functional activities. Baseline: 06/01/23: 20/56; 3/27: 23/56 Goal status: ONGOING   3. Patient will reduce timed up and go to <11 seconds to reduce fall risk and demonstrate improved transfer/gait ability. Baseline: 06/12/23: 29.40 seconds using  Up-Walker with CGA/min A; 3/27: 22.29 seconds with Up-walker and posterior LOB upon standing  Goal status: ONGOING  4.Patient will increase 10 meter walk test to >1.27m/s as to improve gait speed for better community ambulation and to reduce fall risk. Baseline: 06/01/23: 0.25m/s using Up-Walker; 3/27: 0.89 m/s using Up Walker Goal status: ONGOING  5.  Patient will require no more than supervision assist with ascending/descending 3 steps using handrails per home set-up.  Baseline: 06/12/23: requires min A using HR due to posterior lean/LOB especially when turning at the top; 3/27: minA required using HR due to posterior LOB on first step   Goal status: ONGOING   ASSESSMENT:  CLINICAL IMPRESSION:    Patient arrives to treatment session motivated to participate with reports of having 1 fall on Monday due to posterior LOB. During session, pt participated in hip strengthening and dynamic stepping balance retraining to improve pt's balance and stability during standing and ambulatory tasks. Pt continues to demo bilateral (R>L) hip weakness that results in balance instability during functional mobility. Participated in progression of supine bridges for strengthening followed by forward/backwards stepping balance retraining. Pt continues to have R posterolateral LOB. Transitioned to targeted intervention to promote improved R posterolateral step to maintain upright, which pt tolerated well and was improving on as his motor plan improved. Pt would benefit from continuation of this intervention. Mr. Boyajian will benefit from continued skilled physical therapy to further address his balance deficits in order to decrease his fall risk and promote increased independence with functional mobility and ADLs.   OBJECTIVE IMPAIRMENTS: Abnormal gait, decreased activity tolerance, decreased balance, decreased cognition, decreased endurance, decreased knowledge of condition, decreased knowledge of use of DME, decreased  mobility, difficulty walking, decreased strength, decreased safety awareness, hypomobility, impaired flexibility, impaired sensation, improper body mechanics, postural dysfunction, and pain.   ACTIVITY LIMITATIONS: carrying, lifting, bending, standing, squatting, sleeping, stairs, transfers, bed mobility, continence, bathing, toileting, dressing, reach over head, hygiene/grooming, and locomotion level  PARTICIPATION LIMITATIONS: meal prep, cleaning, laundry, medication management, driving, and community activity  PERSONAL FACTORS: Age, Sex, Time since onset of injury/illness/exacerbation, and 3+ comorbidities: Parkinson's, deep brain stim, depression, lipids, HTN, GERD, ED, dementia, seizures/falls, LBP/PSF, hx of lumbar fusion in 04/2022, hx of hip fx 08/2022, arthritis, frequent falls,  and OSA  are also affecting patient's functional  outcome.   REHAB POTENTIAL: Good  CLINICAL DECISION MAKING: Evolving/moderate complexity  EVALUATION COMPLEXITY: Moderate  PLAN:  PT FREQUENCY: 1-2x/week  PT DURATION: 12 weeks  PLANNED INTERVENTIONS: 97164- PT Re-evaluation, 97110-Therapeutic exercises, 97530- Therapeutic activity, 97112- Neuromuscular re-education, 97535- Self Care, [id number]- Manual therapy, [phone]- Gait training, [phone]- Orthotic Fit/training, [phone]- Electrical stimulation (manual), Patient/Family education, Balance training, Stair training, Taping, Joint mobilization, Joint manipulation, Spinal mobilization, Vestibular training, Cognitive remediation, DME instructions, Cryotherapy, and Moist heat  PLAN FOR NEXT SESSION:  *add dual-task challenges to interventions* Balance:   - posterior stepping strategy training with R posterolateral stepping Dynamic Gait without AD:  - backwards walking  - quick/sudden turning  - Head rotations  Strengthening:  - reinforce R hip and gluteal strengthening - bridge with resistance - clamshells! - strengthening hips to create wider BOS for improved  balance during gait    Sheniah Supak Verdell Face, PT, DPT Physical Therapist - [facility]  11:20 AM 07/20/23

## 2023-07-24 ENCOUNTER — Ambulatory Visit: Payer: Medicare PPO | Admitting: Physical Therapy

## 2023-07-24 DIAGNOSIS — G20A1 Parkinson's disease without dyskinesia, without mention of fluctuations: Secondary | ICD-10-CM | POA: Diagnosis not present

## 2023-07-24 DIAGNOSIS — R269 Unspecified abnormalities of gait and mobility: Secondary | ICD-10-CM

## 2023-07-24 DIAGNOSIS — M6281 Muscle weakness (generalized): Secondary | ICD-10-CM | POA: Diagnosis not present

## 2023-07-24 DIAGNOSIS — R278 Other lack of coordination: Secondary | ICD-10-CM | POA: Diagnosis not present

## 2023-07-24 DIAGNOSIS — R296 Repeated falls: Secondary | ICD-10-CM

## 2023-07-24 DIAGNOSIS — R2689 Other abnormalities of gait and mobility: Secondary | ICD-10-CM

## 2023-07-24 DIAGNOSIS — R2681 Unsteadiness on feet: Secondary | ICD-10-CM

## 2023-07-24 DIAGNOSIS — R262 Difficulty in walking, not elsewhere classified: Secondary | ICD-10-CM | POA: Diagnosis not present

## 2023-07-24 NOTE — Therapy (Signed)
 OUTPATIENT PHYSICAL THERAPY NEURO TREATMENT   Patient Name: [name] MRN: [id number] DOB:Sep 19, 1950, 73 y.o., male Today's Date: 07/24/2023   PCP: Caffie Pinto, PA-C REFERRING PROVIDER: Suzy Bouchard, PA   END OF SESSION:    PT End of Session - 07/24/23 1101     Visit Number 12    Number of Visits 24    Date for PT Re-Evaluation 08/24/23    Authorization Type [employer] [id number]    PT Start Time 1102    PT Stop Time 1149    PT Time Calculation (min) 47 min    Equipment Utilized During Treatment Gait belt    Activity Tolerance Patient tolerated treatment well;No increased pain    Behavior During Therapy WFL for tasks assessed/performed;Flat affect                Past Medical History:  Diagnosis Date   Anxiety disorder    Arthritis    Bradycardia    Cancer (HCC) 2013   skin cancer   Depression    ptsd   Dysrhythmia    chronic slow heart rate   GERD (gastroesophageal reflux disease)    H/O tachycardia-bradycardia syndrome    Headache(784.0)    tension headaches non recent   History of chicken pox    History of kidney stones    passed   Hypertension    treated with HCTZ   Major depressive disorder    Mild aortic regurgitation    Pacemaker    Parkinson's disease (HCC)    dx'ed 15 years ago   PTSD (post-traumatic stress disorder)    S/P deep brain stimulator placement    Shortness of breath dyspnea    Sleep apnea    doesn't use C-pap   Unspecified dementia, moderate, with psychotic disturbance (HCC)    Varicose veins    Past Surgical History:  Procedure Laterality Date   CATARACT EXTRACTION W/PHACO Right 01/17/2023   Procedure: CATARACT EXTRACTION PHACO AND INTRAOCULAR LENS PLACEMENT (IOC) RIGHT 6.33 00:42.6;  Surgeon: Galen Manila, MD;  Location: [facility];  Service: Ophthalmology;  Laterality: Right;   CATARACT EXTRACTION W/PHACO Left 02/07/2023   Procedure: CATARACT EXTRACTION PHACO AND INTRAOCULAR LENS PLACEMENT (IOC) LEFT  6.35 00:38.3;  Surgeon: Galen Manila, MD;  Location: [facility];  Service: Ophthalmology;  Laterality: Left;   CHOLECYSTECTOMY N/A 10/22/2014   Procedure: LAPAROSCOPIC CHOLECYSTECTOMY WITH INTRAOPERATIVE CHOLANGIOGRAM;  Surgeon: Tiney Rouge III, MD;  Location: [facility];  Service: General;  Laterality: N/A;   cyst removed      from lip as a child   INTRAMEDULLARY (IM) NAIL INTERTROCHANTERIC N/A 02/18/2019   Procedure: INTRAMEDULLARY (IM) NAIL INTERTROCHANTRIC, RIGHT,;  Surgeon: Juanell Fairly, MD;  Location: [facility];  Service: Orthopedics;  Laterality: N/A;   LUMBAR LAMINECTOMY/DECOMPRESSION MICRODISCECTOMY Bilateral 12/14/2012   Procedure: Bilateral lumbar three-four, four-five decompressive laminotomy/foraminotomy;  Surgeon: Temple Pacini, MD;  Location: [facility];  Service: Neurosurgery;  Laterality: Bilateral;   PULSE GENERATOR IMPLANT Bilateral 12/13/2013   Procedure: Bilateral implantable pulse generator placement;  Surgeon: Maeola Harman, MD;  Location: [facility];  Service: Neurosurgery;  Laterality: Bilateral;  Bilateral implantable pulse generator placement   skin cancer removed     from ears,   12 lft arm  rt leg 15   SUBTHALAMIC STIMULATOR BATTERY REPLACEMENT Bilateral 07/14/2017   Procedure: BILATERAL IMPLANTED PULSE GENERATOR CHANGE FOR DEEP BRAIN STIMULATOR;  Surgeon: Maeola Harman, MD;  Location: [facility];  Service: Neurosurgery;  Laterality: Bilateral;  SUBTHALAMIC STIMULATOR INSERTION Bilateral 12/06/2013   Procedure: SUBTHALAMIC STIMULATOR INSERTION;  Surgeon: Maeola Harman, MD;  Location: [facility];  Service: Neurosurgery;  Laterality: Bilateral;  Bilateral deep brain stimulator placement   Patient Active Problem List   Diagnosis Date Noted   Hematuria 10/10/2021   Sinus drainage 12/09/2020   Medicare annual wellness visit, subsequent 11/18/2020   Pacemaker    Dementia associated with Parkinson's disease (HCC) 06/17/2020   Vertigo 02/19/2020   Urine abnormality  02/19/2020   Dysphagia 02/23/2019   Fall at home 10/18/2017   REM behavioral disorder 11/02/2016   Radicular pain in right arm 10/09/2016   GERD (gastroesophageal reflux disease) 07/31/2016   Colon cancer screening 07/23/2015   Gout 02/18/2015   Depression 01/06/2015   Skin lesion 10/02/2014   Dupuytren's contracture 08/20/2014   Cough 07/21/2014   Lumbar stenosis with neurogenic claudication 06/13/2014   S/P deep brain stimulator placement 05/08/2014   Advance care planning 01/19/2014   Parkinson's disease (HCC) 12/13/2013   PTSD (post-traumatic stress disorder) 06/13/2013   Erectile dysfunction 06/13/2013   HLD (hyperlipidemia) 06/13/2013   Essential hypertension 06/03/2013   Bradycardia by electrocardiogram 06/03/2013   Obstructive sleep apnea 03/12/2013    ONSET DATE: ~15 years ago when pt was diagnosed with Parkinson's  REFERRING DIAG: G20.A1 (ICD-10-CM) - Parkinsons disease (HCC)   THERAPY DIAG:  Abnormality of gait and mobility  Unsteadiness on feet  Muscle weakness (generalized)  Repeated falls  Difficulty in walking, not elsewhere classified  Other abnormalities of gait and mobility  Other lack of coordination  Rationale for Evaluation and Treatment: Rehabilitation  SUBJECTIVE:                                                                                                                                                                                             SUBJECTIVE STATEMENT:  Wife reports pt had a fall in their dining room over the weekend due to pt continuing to have posterior lean/LOB tendency. Despite recent falls, pt reports he feels like he has improved overall since starting therapy.  Pt reports on the days he falls, if he is sore from the fall, he doesn't do his HEP. Pt reports he is not sore today, but he did take tylenol this morning.  *Of note: pt requires increased time for verbal responses to therapist's questions and pt is easily  distracted in busy environment  Pt accompanied by: self and his wife was present at beginning and end of session  PERTINENT HISTORY: Parkinson's, deep brain stim, depression, lipids, HTN, GERD, ED, dementia, seizures/falls, LBP/PSF, hx of lumbar fusion in 04/2022, hx of  hip fx 08/2022, arthritis, frequent falls,  and OSA   PAIN:  Are you having pain? Yes: NPRS scale: 7/10 with activity below, but otherwise 3/10 at rest  Pain location: L calvicle Pain description: ache to sharp Aggravating factors: when going to push-up to stand or control himself when sitting using armrest Relieving factors: rest and support in arm sling  PRECAUTIONS: Fall and Other:    frequent falls, most recent on 05/22/2023 with ED visit, L superolateral clavicular fx (out of sling on 06/15/23 due to pt/family discontinuing it), deep brain stimulator  RED FLAGS: Bowel or bladder incontinence: Yes: "on occasion" has incontinence    WEIGHT BEARING RESTRICTIONS: Yes unsure of specific L UE WBing restrictions, but pt currently using an AD for ambulation, while wearing basic arm sling  FALLS: Has patient fallen in last 6 months? Yes. Number of falls 10+  most recent on 05/22/2023 with injury to L shoulder  LIVING ENVIRONMENT: Lives with: lives with their spouse (wife, Radio broadcast assistant) and has hired caregivers Lives in: House/apartment Stairs: Yes: External: 2-3 steps; bilateral but cannot reach both (due to current home renovations, this is the entrance he uses) primarily uses R HR Has following equipment at home: Wheelchair (manual), shower chair, Grab bars, and Up-Walker  PLOF: Independent with household mobility with device, Requires assistive device for independence, Needs assistance with ADLs, and meal prep   Pt reports he frequently leaves his AD to the side when going to get something, often forgetting it - he requires reinforced education on consistent use of U-step walker  PATIENT GOALS: Improve Balance  OBJECTIVE:  Note:  Objective measures were completed at Evaluation unless otherwise noted.  DIAGNOSTIC FINDINGS:   CT CERVICAL SPINE WITHOUT CONTRAST on 05/22/2023 IMPRESSION: Disc levels: Disc space narrowing and spurring in the lower cervical spine. Mild bilateral degenerative facet disease, left greater than right. Degenerative disc and facet disease throughout the cervical spine. No acute bony abnormality.  CT HEAD WITHOUT CONTRAST on 05/22/2023 FINDINGS: Brain: Deep brain stimulators are noted with the tips in the region of the thalami bilaterally, unchanged. Mild atrophy. No acute intracranial abnormality. Specifically, no hemorrhage, hydrocephalus, mass lesion, acute infarction, or significant intracranial injury. Vascular: No hyperdense vessel or unexpected calcification. Skull: No acute calvarial abnormality. Sinuses/Orbits: No acute findings Other: None IMPRESSION: No acute intracranial abnormality.  LEFT SHOULDER - 2+ VIEW on 05/22/2023 IMPRESSION: Acute mildly displaced fracture of the lateral aspect of the left clavicle with intra-articular extension.   COGNITION: Overall cognitive status: History of cognitive impairments - at baseline and pt with very delayed verbal responses to subjective questioning and noted to be easily distracted in the therapy gym environment   SENSATION: Light touch: Impaired  and decreased distally in B LEs, but specifically decreased throughout R LE compared to L LE   COORDINATION: Not formally assessed  EDEMA:  Not formally assessed, but no significant swelling on visual exam  MUSCLE TONE: WFL  MUSCLE LENGTH: Hamstrings: Right ____ deg; Left ___ deg  *not assessed, but would benefit from testing  DTRs:  Not formally assessed  POSTURE: rounded shoulders, forward head, decreased lumbar lordosis, and posterior pelvic tilt, posterior weight shift bias  LOWER EXTREMITY ROM:     Active  Not formally assessed Right Eval Left Eval  Hip  flexion    Hip extension    Hip abduction    Hip adduction    Hip internal rotation    Hip external rotation    Knee flexion    Knee  extension    Ankle dorsiflexion Would benefit from testing Would benefit from testing  Ankle plantarflexion    Ankle inversion    Ankle eversion     (Blank rows = not tested)  LOWER EXTREMITY MMT:    MMT Right Eval Left Eval  Hip flexion 4 4  Hip extension    Hip abduction    Hip adduction    Hip internal rotation    Hip external rotation    Knee flexion 4+ 5  Knee extension 4+ 4+  Ankle dorsiflexion 4- 4  Ankle plantarflexion 4 4-  Ankle inversion    Ankle eversion    (Blank rows = not tested)  Manual Muscle Test Scale 0/5 = No muscle contraction can be seen or felt 1/5 = Contraction can be felt, but there is no motion 2-/5 = Part moves through incomplete ROM w/ gravity decreased 2/5 = Part moves through complete ROM w/ gravity decreased 2+/5 = Part moves through incomplete ROM (<50%) against gravity or through complete ROM w/ gravity 3-/5 = Part moves through incomplete ROM (>50%) against gravity 3/5 = Part moves through complete ROM against gravity 3+/5 = Part moves through complete ROM against gravity/slight resistance 4-/5= Holds test position against slight to moderate pressure 4/5 = Part moves through complete ROM against gravity/moderate resistance 4+/5= Holds test position against moderate to strong pressure 5/5 = Part moves through complete ROM against gravity/full resistance  BED MOBILITY:  Sit to supine Min A and Mod A Supine to sit Min A and Mod A Requires adjustable bed with bedrail at home   TRANSFERS: Assistive device utilized:  Up-Walker   Sit to stand: SBA, CGA, and Min A Stand to sit: SBA, CGA, and Min A Chair to chair: CGA and Min A Floor:  Not assessed **Requires variable assist levels for transfers due to imbalance  RAMP:  Level of Assistance:    Assistive device utilized:  Ramp Comments: Not formally  assessed, may benefit from testing in future  CURB:  Level of Assistance:  Assistive device utilized:  Curb Comments: Not formally assessed, may benefit from testing in future  STAIRS: Level of Assistance:  06/12/23: Min A Stair Negotiation Technique: Forwards with Bilateral Rails  Number of Stairs: 4  Height of Stairs: 6inch  Comments: 06/12/23 - posterior LOB at top of steps when turning around, minor posterior lean throughout  GAIT: Gait pattern: step through pattern, decreased step length- Right, decreased step length- Left, decreased stride length, knee flexed in stance- Right, knee flexed in stance- Left, shuffling, festinating, trunk flexed, wide BOS, poor foot clearance- Right, and poor foot clearance- Left Distance walked: ~166ft Assistive device utilized:  Up-Walker Level of assistance: CGA and Min A Comments: Noticeable festination when turning, stepping back to sit, or side stepping  FUNCTIONAL TESTS:  5 times sit to stand: 22 seconds Timed up and go (TUG): 06/12/23: 29.40 seconds using Up-Walker with CGA/min A 10 meter walk test: 0.81m/s Berg Balance Scale: 20/56  PATIENT SURVEYS:  ABC scale 06/12/23: 30%  TREATMENT DATE:  07/24/23  Pt arrived to therapy ambulating with Up-Walker with close SBA/CGA for safety.  Gait training ~375ft, no AD, with light min A for balance - continues to have narrow BOS, but not has significant as prior sessions, balance instability due to hip instability (L foot adducting more than R today).  Supine bridge progression as follows:  - added level 3 green theraband resistance around knees x15 reps - keeping tband, added 5lb weight across hips x15 reps - removed weight due to pt stating feeling "tightness" in his low back and repeated another 15 reps with only tband resistance Cuing for proper form/technique - noticed  pt with difficulty keeping R knee abducted demoing hip weakness  Forward/backwards gait training ~10-60ft each direction with therapist spontanouesly calling "stop" during backwards gait to challenge sudden stops and recovery of balance  - progressed to dual-task with pt walking forward to "home base" and then backwards to Blaze Pods set on random on pt's R side where he had to stop, keep his balance, and rotate to tap the Pod. Pt does better taking a R lateral step and reaching with R hand to tap Pod while maintaining balance than anticipated. However, with fatigue or distraction's pt's posterior lean becomes more prominent requiring progressively heavier min A to maintain upright.  Repeated R LE foot taps over 1/2 foam roll to 2 Blaze Pods with focus on targeting R postero-lateral step when bringing his foot back over foam roll x10reps then progressed to performing with level 1 theraband resistance around ankle; however, pt fatigued at this time requiring heavy min/mod A to prevent posterior LOB and so discontinued the intervention. Pt would benefit from continuation of this exercise   PATIENT EDUCATION: Education details: PT POC, LTGs, findings on balance assessments today Person educated: Patient and Caregiver Angelica Education method: Explanation Education comprehension: verbalized understanding and needs further education  HOME EXERCISE PROGRAM:   Access Code: [email or web address] URL: [email or web address] Date: 06/28/2023 Prepared by: Casimiro Needle  Exercises - Walking  - 1 x daily - 7 x weekly - 2 sets - 3-5 minutes hold - Sit to Stand  - 1 x daily - 7 x weekly - 2 sets - 12-15 reps - Side Stepping with Resistance at Thighs and Counter Support  - 1 x daily - 7 x weekly - 3 sets - 10 reps - Supine Bridge with Resistance Band  - 1 x daily - 7 x weekly - 2 sets - 10-15 reps - Clamshell  - 1 x daily - 7 x weekly - 3 sets - 10 reps  GOALS: Goals reviewed with patient? Yes  SHORT  TERM GOALS: Target date: 07/13/2023    Patient will be independent in home exercise program to improve strength/mobility for better functional independence with ADLs. Baseline: 06/20/2023: HEP provided 06/28/2023: updated Goal status: ONGOING   2. Patient will report <2 falls over past 6 weeks to demonstrate improved safety awareness at home.   Baseline: 06/01/2023 = 10+ falls in past 6 months; 3/27: 10+ falls per patient report in past 6 weeks   Goal Status: ONGOING  LONG TERM GOALS: Target date: 08/24/2023   1.  Patient (> 66 years old) will complete five times sit to stand test in < 15 seconds indicating an increased LE strength and improved balance. Baseline: 06/01/23: 22 seconds with posterior LOB; 3/27: 11.51 seconds Goal status: MET  2.  Patient will increase Berg Balance score by > 6 points to demonstrate decreased fall risk during functional  activities. Baseline: 06/01/23: 20/56; 3/27: 23/56 Goal status: ONGOING   3. Patient will reduce timed up and go to <11 seconds to reduce fall risk and demonstrate improved transfer/gait ability. Baseline: 06/12/23: 29.40 seconds using Up-Walker with CGA/min A; 3/27: 22.29 seconds with Up-walker and posterior LOB upon standing  Goal status: ONGOING  4.Patient will increase 10 meter walk test to >1.82m/s as to improve gait speed for better community ambulation and to reduce fall risk. Baseline: 06/01/23: 0.60m/s using Up-Walker; 3/27: 0.89 m/s using Up Walker Goal status: ONGOING  5.  Patient will require no more than supervision assist with ascending/descending 3 steps using handrails per home set-up.  Baseline: 06/12/23: requires min A using HR due to posterior lean/LOB especially when turning at the top; 3/27: minA required using HR due to posterior LOB on first step   Goal status: ONGOING   ASSESSMENT:  CLINICAL IMPRESSION:    Patient arrives to treatment session motivated to participate with reports of having 1 fall since last therapy  session. During session, pt participated in hip strengthening and dynamic stepping balance retraining to improve pt's balance and stability during standing and ambulatory tasks. Pt continues to demo bilateral (R>L) hip weakness that results in balance instability during functional mobility. Participated in progression of supine bridges for strengthening followed by forward/backwards gait training to improve stepping strategy to prevent LOB. Pt continues to have R posterolateral LOB bias. Transitioned to targeted intervention to improve R posterolateral step to maintain upright, with pt becoming fatigued at this point today requiring increased assist for balance. Pt would benefit from continuation of this intervention. [name] will benefit from continued skilled physical therapy to further address his balance deficits in order to decrease his fall risk and promote increased independence with functional mobility and ADLs.   OBJECTIVE IMPAIRMENTS: Abnormal gait, decreased activity tolerance, decreased balance, decreased cognition, decreased endurance, decreased knowledge of condition, decreased knowledge of use of DME, decreased mobility, difficulty walking, decreased strength, decreased safety awareness, hypomobility, impaired flexibility, impaired sensation, improper body mechanics, postural dysfunction, and pain.   ACTIVITY LIMITATIONS: carrying, lifting, bending, standing, squatting, sleeping, stairs, transfers, bed mobility, continence, bathing, toileting, dressing, reach over head, hygiene/grooming, and locomotion level  PARTICIPATION LIMITATIONS: meal prep, cleaning, laundry, medication management, driving, and community activity  PERSONAL FACTORS: Age, Sex, Time since onset of injury/illness/exacerbation, and 3+ comorbidities: Parkinson's, deep brain stim, depression, lipids, HTN, GERD, ED, dementia, seizures/falls, LBP/PSF, hx of lumbar fusion in 04/2022, hx of hip fx 08/2022, arthritis, frequent  falls,  and OSA  are also affecting patient's functional outcome.   REHAB POTENTIAL: Good  CLINICAL DECISION MAKING: Evolving/moderate complexity  EVALUATION COMPLEXITY: Moderate  PLAN:  PT FREQUENCY: 1-2x/week  PT DURATION: 12 weeks  PLANNED INTERVENTIONS: 97164- PT Re-evaluation, 97110-Therapeutic exercises, 97530- Therapeutic activity, 97112- Neuromuscular re-education, 97535- Self Care, 16109- Manual therapy, [phone]- Gait training, [phone]- Orthotic Fit/training, [phone]- Electrical stimulation (manual), Patient/Family education, Balance training, Stair training, Taping, Joint mobilization, Joint manipulation, Spinal mobilization, Vestibular training, Cognitive remediation, DME instructions, Cryotherapy, and Moist heat  PLAN FOR NEXT SESSION:  *add dual-task challenges to interventions* Balance:   - posterior stepping strategy training with R posterolateral stepping Dynamic Gait without AD:  - backwards walking  - quick/sudden turning  - Head rotations  Strengthening:  - reinforce R hip and gluteal strengthening - bridge with resistance - clamshells! - strengthening hips to create wider BOS for improved balance during gait    Ladoris Lythgoe Verdell Face, PT, DPT Physical Therapist - [place]  [facility]  2:11 PM 07/24/23

## 2023-07-27 ENCOUNTER — Ambulatory Visit: Payer: Medicare PPO | Admitting: Physical Therapy

## 2023-07-27 DIAGNOSIS — R2689 Other abnormalities of gait and mobility: Secondary | ICD-10-CM

## 2023-07-27 DIAGNOSIS — R2681 Unsteadiness on feet: Secondary | ICD-10-CM

## 2023-07-27 DIAGNOSIS — R269 Unspecified abnormalities of gait and mobility: Secondary | ICD-10-CM

## 2023-07-27 DIAGNOSIS — G20A1 Parkinson's disease without dyskinesia, without mention of fluctuations: Secondary | ICD-10-CM | POA: Diagnosis not present

## 2023-07-27 DIAGNOSIS — R296 Repeated falls: Secondary | ICD-10-CM

## 2023-07-27 DIAGNOSIS — R262 Difficulty in walking, not elsewhere classified: Secondary | ICD-10-CM

## 2023-07-27 DIAGNOSIS — R278 Other lack of coordination: Secondary | ICD-10-CM | POA: Diagnosis not present

## 2023-07-27 DIAGNOSIS — M6281 Muscle weakness (generalized): Secondary | ICD-10-CM

## 2023-07-27 NOTE — Therapy (Signed)
 OUTPATIENT PHYSICAL THERAPY NEURO TREATMENT   Patient Name: [name] MRN: [id number] DOB:03/16/1951, 73 y.o., male Today's Date: 07/27/2023   PCP: Caffie Pinto, PA-C REFERRING PROVIDER: Suzy Bouchard, PA   END OF SESSION:    PT End of Session - 07/27/23 1148     Visit Number 13    Number of Visits 24    Date for PT Re-Evaluation 08/24/23    Authorization Type [employer] [id number]    PT Start Time 1148    PT Stop Time 1232    PT Time Calculation (min) 44 min    Equipment Utilized During Treatment Gait belt    Activity Tolerance Patient tolerated treatment well;No increased pain    Behavior During Therapy WFL for tasks assessed/performed;Flat affect                 Past Medical History:  Diagnosis Date   Anxiety disorder    Arthritis    Bradycardia    Cancer (HCC) 2013   skin cancer   Depression    ptsd   Dysrhythmia    chronic slow heart rate   GERD (gastroesophageal reflux disease)    H/O tachycardia-bradycardia syndrome    Headache(784.0)    tension headaches non recent   History of chicken pox    History of kidney stones    passed   Hypertension    treated with HCTZ   Major depressive disorder    Mild aortic regurgitation    Pacemaker    Parkinson's disease (HCC)    dx'ed 15 years ago   PTSD (post-traumatic stress disorder)    S/P deep brain stimulator placement    Shortness of breath dyspnea    Sleep apnea    doesn't use C-pap   Unspecified dementia, moderate, with psychotic disturbance (HCC)    Varicose veins    Past Surgical History:  Procedure Laterality Date   CATARACT EXTRACTION W/PHACO Right 01/17/2023   Procedure: CATARACT EXTRACTION PHACO AND INTRAOCULAR LENS PLACEMENT (IOC) RIGHT 6.33 00:42.6;  Surgeon: Galen Manila, MD;  Location: [facility];  Service: Ophthalmology;  Laterality: Right;   CATARACT EXTRACTION W/PHACO Left 02/07/2023   Procedure: CATARACT EXTRACTION PHACO AND INTRAOCULAR LENS PLACEMENT (IOC) LEFT  6.35 00:38.3;  Surgeon: Galen Manila, MD;  Location: [facility];  Service: Ophthalmology;  Laterality: Left;   CHOLECYSTECTOMY N/A 10/22/2014   Procedure: LAPAROSCOPIC CHOLECYSTECTOMY WITH INTRAOPERATIVE CHOLANGIOGRAM;  Surgeon: Tiney Rouge III, MD;  Location: [facility];  Service: General;  Laterality: N/A;   cyst removed      from lip as a child   INTRAMEDULLARY (IM) NAIL INTERTROCHANTERIC N/A 02/18/2019   Procedure: INTRAMEDULLARY (IM) NAIL INTERTROCHANTRIC, RIGHT,;  Surgeon: Juanell Fairly, MD;  Location: [facility];  Service: Orthopedics;  Laterality: N/A;   LUMBAR LAMINECTOMY/DECOMPRESSION MICRODISCECTOMY Bilateral 12/14/2012   Procedure: Bilateral lumbar three-four, four-five decompressive laminotomy/foraminotomy;  Surgeon: Temple Pacini, MD;  Location: [facility];  Service: Neurosurgery;  Laterality: Bilateral;   PULSE GENERATOR IMPLANT Bilateral 12/13/2013   Procedure: Bilateral implantable pulse generator placement;  Surgeon: Maeola Harman, MD;  Location: [facility];  Service: Neurosurgery;  Laterality: Bilateral;  Bilateral implantable pulse generator placement   skin cancer removed     from ears,   12 lft arm  rt leg 15   SUBTHALAMIC STIMULATOR BATTERY REPLACEMENT Bilateral 07/14/2017   Procedure: BILATERAL IMPLANTED PULSE GENERATOR CHANGE FOR DEEP BRAIN STIMULATOR;  Surgeon: Maeola Harman, MD;  Location: [facility];  Service: Neurosurgery;  Laterality: Bilateral;  SUBTHALAMIC STIMULATOR INSERTION Bilateral 12/06/2013   Procedure: SUBTHALAMIC STIMULATOR INSERTION;  Surgeon: Maeola Harman, MD;  Location: [facility];  Service: Neurosurgery;  Laterality: Bilateral;  Bilateral deep brain stimulator placement   Patient Active Problem List   Diagnosis Date Noted   Hematuria 10/10/2021   Sinus drainage 12/09/2020   Medicare annual wellness visit, subsequent 11/18/2020   Pacemaker    Dementia associated with Parkinson's disease (HCC) 06/17/2020   Vertigo 02/19/2020   Urine abnormality  02/19/2020   Dysphagia 02/23/2019   Fall at home 10/18/2017   REM behavioral disorder 11/02/2016   Radicular pain in right arm 10/09/2016   GERD (gastroesophageal reflux disease) 07/31/2016   Colon cancer screening 07/23/2015   Gout 02/18/2015   Depression 01/06/2015   Skin lesion 10/02/2014   Dupuytren's contracture 08/20/2014   Cough 07/21/2014   Lumbar stenosis with neurogenic claudication 06/13/2014   S/P deep brain stimulator placement 05/08/2014   Advance care planning 01/19/2014   Parkinson's disease (HCC) 12/13/2013   PTSD (post-traumatic stress disorder) 06/13/2013   Erectile dysfunction 06/13/2013   HLD (hyperlipidemia) 06/13/2013   Essential hypertension 06/03/2013   Bradycardia by electrocardiogram 06/03/2013   Obstructive sleep apnea 03/12/2013    ONSET DATE: ~15 years ago when pt was diagnosed with Parkinson's  REFERRING DIAG: G20.A1 (ICD-10-CM) - Parkinsons disease (HCC)   THERAPY DIAG:  Abnormality of gait and mobility  Unsteadiness on feet  Muscle weakness (generalized)  Repeated falls  Difficulty in walking, not elsewhere classified  Other abnormalities of gait and mobility  Other lack of coordination  Rationale for Evaluation and Treatment: Rehabilitation  SUBJECTIVE:                                                                                                                                                                                             SUBJECTIVE STATEMENT:  Pt reports he is doing OK. Reports he stumbled once this morning when he leaned over and got his feet tangled up. Caregiver reports pt has been having increased posterior LOB lately (past ~ week).  *Of note: pt requires increased time for verbal responses to therapist's questions and pt is easily distracted in busy environment  Pt accompanied by: self and his caregiver  PERTINENT HISTORY: Parkinson's, deep brain stim, depression, lipids, HTN, GERD, ED, dementia,  seizures/falls, LBP/PSF, hx of lumbar fusion in 04/2022, hx of hip fx 08/2022, arthritis, frequent falls,  and OSA   PAIN:  Are you having pain? Yes: NPRS scale: 7/10 with activity below, but otherwise 3/10 at rest  Pain location: L calvicle Pain description: ache to sharp Aggravating factors: when going to  push-up to stand or control himself when sitting using armrest Relieving factors: rest and support in arm sling  PRECAUTIONS: Fall and Other:    frequent falls, most recent on 05/22/2023 with ED visit, L superolateral clavicular fx (out of sling on 06/15/23 due to pt/family discontinuing it), deep brain stimulator  RED FLAGS: Bowel or bladder incontinence: Yes: "on occasion" has incontinence    WEIGHT BEARING RESTRICTIONS: Yes unsure of specific L UE WBing restrictions, but pt currently using an AD for ambulation, while wearing basic arm sling  FALLS: Has patient fallen in last 6 months? Yes. Number of falls 10+  most recent on 05/22/2023 with injury to L shoulder  LIVING ENVIRONMENT: Lives with: lives with their spouse (wife, Radio broadcast assistant) and has hired caregivers Lives in: House/apartment Stairs: Yes: External: 2-3 steps; bilateral but cannot reach both (due to current home renovations, this is the entrance he uses) primarily uses R HR Has following equipment at home: Wheelchair (manual), shower chair, Grab bars, and Up-Walker  PLOF: Independent with household mobility with device, Requires assistive device for independence, Needs assistance with ADLs, and meal prep   Pt reports he frequently leaves his AD to the side when going to get something, often forgetting it - he requires reinforced education on consistent use of U-step walker  PATIENT GOALS: Improve Balance  OBJECTIVE:  Note: Objective measures were completed at Evaluation unless otherwise noted.  DIAGNOSTIC FINDINGS:   CT CERVICAL SPINE WITHOUT CONTRAST on 05/22/2023 IMPRESSION: Disc levels: Disc space narrowing and spurring  in the lower cervical spine. Mild bilateral degenerative facet disease, left greater than right. Degenerative disc and facet disease throughout the cervical spine. No acute bony abnormality.  CT HEAD WITHOUT CONTRAST on 05/22/2023 FINDINGS: Brain: Deep brain stimulators are noted with the tips in the region of the thalami bilaterally, unchanged. Mild atrophy. No acute intracranial abnormality. Specifically, no hemorrhage, hydrocephalus, mass lesion, acute infarction, or significant intracranial injury. Vascular: No hyperdense vessel or unexpected calcification. Skull: No acute calvarial abnormality. Sinuses/Orbits: No acute findings Other: None IMPRESSION: No acute intracranial abnormality.  LEFT SHOULDER - 2+ VIEW on 05/22/2023 IMPRESSION: Acute mildly displaced fracture of the lateral aspect of the left clavicle with intra-articular extension.   COGNITION: Overall cognitive status: History of cognitive impairments - at baseline and pt with very delayed verbal responses to subjective questioning and noted to be easily distracted in the therapy gym environment   SENSATION: Light touch: Impaired  and decreased distally in B LEs, but specifically decreased throughout R LE compared to L LE   COORDINATION: Not formally assessed  EDEMA:  Not formally assessed, but no significant swelling on visual exam  MUSCLE TONE: WFL  MUSCLE LENGTH: Hamstrings: Right ____ deg; Left ___ deg  *not assessed, but would benefit from testing  DTRs:  Not formally assessed  POSTURE: rounded shoulders, forward head, decreased lumbar lordosis, and posterior pelvic tilt, posterior weight shift bias  LOWER EXTREMITY ROM:     Active  Not formally assessed Right Eval Left Eval  Hip flexion    Hip extension    Hip abduction    Hip adduction    Hip internal rotation    Hip external rotation    Knee flexion    Knee extension    Ankle dorsiflexion Would benefit from testing Would benefit  from testing  Ankle plantarflexion    Ankle inversion    Ankle eversion     (Blank rows = not tested)  LOWER EXTREMITY MMT:    MMT  Right Eval Left Eval  Hip flexion 4 4  Hip extension    Hip abduction    Hip adduction    Hip internal rotation    Hip external rotation    Knee flexion 4+ 5  Knee extension 4+ 4+  Ankle dorsiflexion 4- 4  Ankle plantarflexion 4 4-  Ankle inversion    Ankle eversion    (Blank rows = not tested)  Manual Muscle Test Scale 0/5 = No muscle contraction can be seen or felt 1/5 = Contraction can be felt, but there is no motion 2-/5 = Part moves through incomplete ROM w/ gravity decreased 2/5 = Part moves through complete ROM w/ gravity decreased 2+/5 = Part moves through incomplete ROM (<50%) against gravity or through complete ROM w/ gravity 3-/5 = Part moves through incomplete ROM (>50%) against gravity 3/5 = Part moves through complete ROM against gravity 3+/5 = Part moves through complete ROM against gravity/slight resistance 4-/5= Holds test position against slight to moderate pressure 4/5 = Part moves through complete ROM against gravity/moderate resistance 4+/5= Holds test position against moderate to strong pressure 5/5 = Part moves through complete ROM against gravity/full resistance  BED MOBILITY:  Sit to supine Min A and Mod A Supine to sit Min A and Mod A Requires adjustable bed with bedrail at home   TRANSFERS: Assistive device utilized:  Up-Walker   Sit to stand: SBA, CGA, and Min A Stand to sit: SBA, CGA, and Min A Chair to chair: CGA and Min A Floor:  Not assessed **Requires variable assist levels for transfers due to imbalance  RAMP:  Level of Assistance:    Assistive device utilized:  Ramp Comments: Not formally assessed, may benefit from testing in future  CURB:  Level of Assistance:  Assistive device utilized:  Curb Comments: Not formally assessed, may benefit from testing in future  STAIRS: Level of Assistance:   06/12/23: Min A Stair Negotiation Technique: Forwards with Bilateral Rails  Number of Stairs: 4  Height of Stairs: 6inch  Comments: 06/12/23 - posterior LOB at top of steps when turning around, minor posterior lean throughout  GAIT: Gait pattern: step through pattern, decreased step length- Right, decreased step length- Left, decreased stride length, knee flexed in stance- Right, knee flexed in stance- Left, shuffling, festinating, trunk flexed, wide BOS, poor foot clearance- Right, and poor foot clearance- Left Distance walked: ~16ft Assistive device utilized:  Up-Walker Level of assistance: CGA and Min A Comments: Noticeable festination when turning, stepping back to sit, or side stepping  FUNCTIONAL TESTS:  5 times sit to stand: 22 seconds Timed up and go (TUG): 06/12/23: 29.40 seconds using Up-Walker with CGA/min A 10 meter walk test: 0.26m/s Berg Balance Scale: 20/56  PATIENT SURVEYS:  ABC scale 06/12/23: 30%  TREATMENT DATE:  07/27/23  Pt arrived to therapy ambulating with Up-Walker with close SBA for safety.  Gait training ~330ft, no AD, with light min A for balance - continues to have narrow BOS, but not has significant as prior sessions, balance instability due to hip instability/weakness - progressed to visual scanning dual-task to identify objects on post-it notes on wall  - has difficulty verbally identifying the last few post-it notes due to fatigue, resulting in difficulty with cognitive recall.   Supine B LE strengthening to improve upright posture and gait including:  bridge with level 3 green theraband resistance around knees 2 x15 reps hooklying hip abduction against level 3 green theraband resistance 2x 10reps This continues to be very challenging for pt with R hip weakness compared to L   Stepping in 4 directions to external target using  colored arrow Powerpoint presentation with rules as follows:  - green arrow: step in direction arrow pointing  - red arrow: step opposite direction of arrow  - blue arrow: stop and catch ball from therapist  *** Posterior stepping balance retraining, having pt lean back into therapist's hands and release on cue, progressed to at random, and then at random while pt counting out-loud for added dual-task challenge at least 80% of the time, pt able to catch balance with close SBA. ***  Performed 2x posterior gait training ~10-68ft with therapist suddenly calling out "stop" to have pt control posterior momentum - on 2nd set progressed to dual-task of counting steps (difficult time with this) ***  ***   - keeping tband, added 5lb weight across hips x15 reps - removed weight due to pt stating feeling "tightness" in his low back and repeated another 15 reps with only tband resistance Cuing for proper form/technique - noticed pt with difficulty keeping R knee abducted demoing hip weakness  Forward/backwards gait training ~10-98ft each direction with therapist spontanouesly calling "stop" during backwards gait to challenge sudden stops and recovery of balance  - progressed to dual-task with pt walking forward to "home base" and then backwards to [facility] Pods set on random on pt's R side where he had to stop, keep his balance, and rotate to tap the Pod. Pt does better taking a R lateral step and reaching with R hand to tap Pod while maintaining balance than anticipated. However, with fatigue or distraction's pt's posterior lean becomes more prominent requiring progressively heavier min A to maintain upright.  Repeated R LE foot taps over 1/2 foam roll to 2 Blaze Pods with focus on targeting R postero-lateral step when bringing his foot back over foam roll x10reps then progressed to performing with level 1 theraband resistance around ankle; however, pt fatigued at this time requiring heavy min/mod A to prevent  posterior LOB and so discontinued the intervention. Pt would benefit from continuation of this exercise   PATIENT EDUCATION: Education details: PT POC, LTGs, findings on balance assessments today Person educated: Patient and Caregiver Angelica Education method: Explanation Education comprehension: verbalized understanding and needs further education  HOME EXERCISE PROGRAM:   Access Code: [email or web address] URL: [email or web address] Date: 06/28/2023 Prepared by: Casimiro Needle  Exercises - Walking  - 1 x daily - 7 x weekly - 2 sets - 3-5 minutes hold - Sit to Stand  - 1 x daily - 7 x weekly - 2 sets - 12-15 reps - Side Stepping with Resistance at Thighs and Counter Support  - 1 x daily - 7 x weekly - 3 sets - 10 reps - Supine  Bridge with Resistance Band  - 1 x daily - 7 x weekly - 2 sets - 10-15 reps - Clamshell  - 1 x daily - 7 x weekly - 3 sets - 10 reps  GOALS: Goals reviewed with patient? Yes  SHORT TERM GOALS: Target date: 07/13/2023    Patient will be independent in home exercise program to improve strength/mobility for better functional independence with ADLs. Baseline: 06/20/2023: HEP provided 06/28/2023: updated Goal status: ONGOING   2. Patient will report <2 falls over past 6 weeks to demonstrate improved safety awareness at home.   Baseline: 06/01/2023 = 10+ falls in past 6 months; 3/27: 10+ falls per patient report in past 6 weeks   Goal Status: ONGOING  LONG TERM GOALS: Target date: 08/24/2023   1.  Patient (> 67 years old) will complete five times sit to stand test in < 15 seconds indicating an increased LE strength and improved balance. Baseline: 06/01/23: 22 seconds with posterior LOB; 3/27: 11.51 seconds Goal status: MET  2.  Patient will increase Berg Balance score by > 6 points to demonstrate decreased fall risk during functional activities. Baseline: 06/01/23: 20/56; 3/27: 23/56 Goal status: ONGOING   3. Patient will reduce timed up and go to <11  seconds to reduce fall risk and demonstrate improved transfer/gait ability. Baseline: 06/12/23: 29.40 seconds using Up-Walker with CGA/min A; 3/27: 22.29 seconds with Up-walker and posterior LOB upon standing  Goal status: ONGOING  4.Patient will increase 10 meter walk test to >1.71m/s as to improve gait speed for better community ambulation and to reduce fall risk. Baseline: 06/01/23: 0.54m/s using Up-Walker; 3/27: 0.89 m/s using Up Walker Goal status: ONGOING  5.  Patient will require no more than supervision assist with ascending/descending 3 steps using handrails per home set-up.  Baseline: 06/12/23: requires min A using HR due to posterior lean/LOB especially when turning at the top; 3/27: minA required using HR due to posterior LOB on first step   Goal status: ONGOING   ASSESSMENT:  CLINICAL IMPRESSION:   *** Patient arrives to treatment session motivated to participate with reports of having 1 fall since last therapy session. During session, pt participated in hip strengthening and dynamic stepping balance retraining to improve pt's balance and stability during standing and ambulatory tasks. Pt continues to demo bilateral (R>L) hip weakness that results in balance instability during functional mobility. Participated in progression of supine bridges for strengthening followed by forward/backwards gait training to improve stepping strategy to prevent LOB. Pt continues to have R posterolateral LOB bias. Transitioned to targeted intervention to improve R posterolateral step to maintain upright, with pt becoming fatigued at this point today requiring increased assist for balance. Pt would benefit from continuation of this intervention. Mr. Burgueno will benefit from continued skilled physical therapy to further address his balance deficits in order to decrease his fall risk and promote increased independence with functional mobility and ADLs.   OBJECTIVE IMPAIRMENTS: Abnormal gait, decreased activity  tolerance, decreased balance, decreased cognition, decreased endurance, decreased knowledge of condition, decreased knowledge of use of DME, decreased mobility, difficulty walking, decreased strength, decreased safety awareness, hypomobility, impaired flexibility, impaired sensation, improper body mechanics, postural dysfunction, and pain.   ACTIVITY LIMITATIONS: carrying, lifting, bending, standing, squatting, sleeping, stairs, transfers, bed mobility, continence, bathing, toileting, dressing, reach over head, hygiene/grooming, and locomotion level  PARTICIPATION LIMITATIONS: meal prep, cleaning, laundry, medication management, driving, and community activity  PERSONAL FACTORS: Age, Sex, Time since onset of injury/illness/exacerbation, and 3+ comorbidities: Parkinson's, deep brain  stim, depression, lipids, HTN, GERD, ED, dementia, seizures/falls, LBP/PSF, hx of lumbar fusion in 04/2022, hx of hip fx 08/2022, arthritis, frequent falls,  and OSA  are also affecting patient's functional outcome.   REHAB POTENTIAL: Good  CLINICAL DECISION MAKING: Evolving/moderate complexity  EVALUATION COMPLEXITY: Moderate  PLAN:  PT FREQUENCY: 1-2x/week  PT DURATION: 12 weeks  PLANNED INTERVENTIONS: 97164- PT Re-evaluation, 97110-Therapeutic exercises, 97530- Therapeutic activity, 97112- Neuromuscular re-education, 97535- Self Care, 16109- Manual therapy, [phone]- Gait training, [phone]- Orthotic Fit/training, [phone]- Electrical stimulation (manual), Patient/Family education, Balance training, Stair training, Taping, Joint mobilization, Joint manipulation, Spinal mobilization, Vestibular training, Cognitive remediation, DME instructions, Cryotherapy, and Moist heat  PLAN FOR NEXT SESSION: *** *add dual-task challenges to interventions* Balance:   - posterior stepping strategy training with R posterolateral stepping Dynamic Gait without AD:  - backwards walking  - quick/sudden turning  - Head rotations   Strengthening:  - reinforce R hip and gluteal strengthening - bridge with resistance - clamshells! - strengthening hips to create wider BOS for improved balance during gait    Dacari Beckstrand Verdell Face, PT, DPT Physical Therapist - [facility]  [facility]  12:34 PM 07/27/23

## 2023-07-30 NOTE — Therapy (Signed)
 OUTPATIENT PHYSICAL THERAPY NEURO TREATMENT   Patient Name: [name] MRN: [id number] DOB:08/03/1950, 73 y.o., male Today's Date: 07/31/2023   PCP: Adelita Honer, PA-C REFERRING PROVIDER: Jacumin, Erika Leigh, PA   END OF SESSION:    PT End of Session - 07/31/23 1255     Visit Number 14    Number of Visits 24    Date for PT Re-Evaluation 08/24/23    Authorization Type [employer] [id number]    PT Start Time 1105    PT Stop Time 1148    PT Time Calculation (min) 43 min    Equipment Utilized During Treatment Gait belt    Activity Tolerance Patient tolerated treatment well;No increased pain    Behavior During Therapy WFL for tasks assessed/performed;Flat affect                  Past Medical History:  Diagnosis Date   Anxiety disorder    Arthritis    Bradycardia    Cancer (HCC) 2013   skin cancer   Depression    ptsd   Dysrhythmia    chronic slow heart rate   GERD (gastroesophageal reflux disease)    H/O tachycardia-bradycardia syndrome    Headache(784.0)    tension headaches non recent   History of chicken pox    History of kidney stones    passed   Hypertension    treated with HCTZ   Major depressive disorder    Mild aortic regurgitation    Pacemaker    Parkinson's disease (HCC)    dx'ed 15 years ago   PTSD (post-traumatic stress disorder)    S/P deep brain stimulator placement    Shortness of breath dyspnea    Sleep apnea    doesn't use C-pap   Unspecified dementia, moderate, with psychotic disturbance (HCC)    Varicose veins    Past Surgical History:  Procedure Laterality Date   CATARACT EXTRACTION W/PHACO Right 01/17/2023   Procedure: CATARACT EXTRACTION PHACO AND INTRAOCULAR LENS PLACEMENT (IOC) RIGHT 6.33 00:42.6;  Surgeon: Clair Crews, MD;  Location: [facility];  Service: Ophthalmology;  Laterality: Right;   CATARACT EXTRACTION W/PHACO Left 02/07/2023   Procedure: CATARACT EXTRACTION PHACO AND INTRAOCULAR LENS PLACEMENT (IOC)  LEFT 6.35 00:38.3;  Surgeon: Clair Crews, MD;  Location: [facility];  Service: Ophthalmology;  Laterality: Left;   CHOLECYSTECTOMY N/A 10/22/2014   Procedure: LAPAROSCOPIC CHOLECYSTECTOMY WITH INTRAOPERATIVE CHOLANGIOGRAM;  Surgeon: Rhina Center III, MD;  Location: [facility];  Service: General;  Laterality: N/A;   cyst removed      from lip as a child   INTRAMEDULLARY (IM) NAIL INTERTROCHANTERIC N/A 02/18/2019   Procedure: INTRAMEDULLARY (IM) NAIL INTERTROCHANTRIC, RIGHT,;  Surgeon: Rande Bushy, MD;  Location: [facility];  Service: Orthopedics;  Laterality: N/A;   LUMBAR LAMINECTOMY/DECOMPRESSION MICRODISCECTOMY Bilateral 12/14/2012   Procedure: Bilateral lumbar three-four, four-five decompressive laminotomy/foraminotomy;  Surgeon: Baruch Bosch, MD;  Location: [facility];  Service: Neurosurgery;  Laterality: Bilateral;   PULSE GENERATOR IMPLANT Bilateral 12/13/2013   Procedure: Bilateral implantable pulse generator placement;  Surgeon: Manya Sells, MD;  Location: [facility];  Service: Neurosurgery;  Laterality: Bilateral;  Bilateral implantable pulse generator placement   skin cancer removed     from ears,   12 lft arm  rt leg 15   SUBTHALAMIC STIMULATOR BATTERY REPLACEMENT Bilateral 07/14/2017   Procedure: BILATERAL IMPLANTED PULSE GENERATOR CHANGE FOR DEEP BRAIN STIMULATOR;  Surgeon: Manya Sells, MD;  Location: [facility];  Service: Neurosurgery;  Laterality: Bilateral;  SUBTHALAMIC STIMULATOR INSERTION Bilateral 12/06/2013   Procedure: SUBTHALAMIC STIMULATOR INSERTION;  Surgeon: Manya Sells, MD;  Location: [facility];  Service: Neurosurgery;  Laterality: Bilateral;  Bilateral deep brain stimulator placement   Patient Active Problem List   Diagnosis Date Noted   Hematuria 10/10/2021   Sinus drainage 12/09/2020   Medicare annual wellness visit, subsequent 11/18/2020   Pacemaker    Dementia associated with Parkinson's disease (HCC) 06/17/2020   Vertigo 02/19/2020   Urine  abnormality 02/19/2020   Dysphagia 02/23/2019   Fall at home 10/18/2017   REM behavioral disorder 11/02/2016   Radicular pain in right arm 10/09/2016   GERD (gastroesophageal reflux disease) 07/31/2016   Colon cancer screening 07/23/2015   Gout 02/18/2015   Depression 01/06/2015   Skin lesion 10/02/2014   Dupuytren's contracture 08/20/2014   Cough 07/21/2014   Lumbar stenosis with neurogenic claudication 06/13/2014   S/P deep brain stimulator placement 05/08/2014   Advance care planning 01/19/2014   Parkinson's disease (HCC) 12/13/2013   PTSD (post-traumatic stress disorder) 06/13/2013   Erectile dysfunction 06/13/2013   HLD (hyperlipidemia) 06/13/2013   Essential hypertension 06/03/2013   Bradycardia by electrocardiogram 06/03/2013   Obstructive sleep apnea 03/12/2013    ONSET DATE: ~15 years ago when pt was diagnosed with Parkinson's  REFERRING DIAG: G20.A1 (ICD-10-CM) - Parkinsons disease (HCC)   THERAPY DIAG:  Abnormality of gait and mobility  Unsteadiness on feet  Muscle weakness (generalized)  Repeated falls  Difficulty in walking, not elsewhere classified  Other abnormalities of gait and mobility  Other lack of coordination  Parkinson's disease without dyskinesia or fluctuating manifestations (HCC)  Rationale for Evaluation and Treatment: Rehabilitation  SUBJECTIVE:                                                                                                                                                                                             SUBJECTIVE STATEMENT:  Pt arrives to the clinic with caregiver.  Caregiver notes that the pt fell 2 times on Friday and then another 2 times yesterday and his wife wanted the clinic to be aware of his L Ue swelling due to fluid build up, likely from the falls.  Pt reports that he fell on the L elbow in the home, but is unsure if he broke anything.  *Of note: pt requires increased time for verbal responses to  therapist's questions and pt is easily distracted in busy environment  Pt accompanied by: self and his caregiver  PERTINENT HISTORY: Parkinson's, deep brain stim, depression, lipids, HTN, GERD, ED, dementia, seizures/falls, LBP/PSF, hx of lumbar fusion in 04/2022, hx of hip fx 08/2022,  arthritis, frequent falls,  and OSA   PAIN:  Are you having pain? Yes: NPRS scale: 7/10 with activity below, but otherwise 3/10 at rest  Pain location: L calvicle Pain description: ache to sharp Aggravating factors: when going to push-up to stand or control himself when sitting using armrest Relieving factors: rest and support in arm sling  PRECAUTIONS: Fall and Other:    frequent falls, most recent on 05/22/2023 with ED visit, L superolateral clavicular fx (out of sling on 06/15/23 due to pt/family discontinuing it), deep brain stimulator  RED FLAGS: Bowel or bladder incontinence: Yes: "on occasion" has incontinence    WEIGHT BEARING RESTRICTIONS: Yes unsure of specific L UE WBing restrictions, but pt currently using an AD for ambulation, while wearing basic arm sling  FALLS: Has patient fallen in last 6 months? Yes. Number of falls 10+  most recent on 05/22/2023 with injury to L shoulder  LIVING ENVIRONMENT: Lives with: lives with their spouse (wife, Radio broadcast assistant) and has hired caregivers Lives in: House/apartment Stairs: Yes: External: 2-3 steps; bilateral but cannot reach both (due to current home renovations, this is the entrance he uses) primarily uses R HR Has following equipment at home: Wheelchair (manual), shower chair, Grab bars, and Up-Walker  PLOF: Independent with household mobility with device, Requires assistive device for independence, Needs assistance with ADLs, and meal prep   Pt reports he frequently leaves his AD to the side when going to get something, often forgetting it - he requires reinforced education on consistent use of U-step walker  PATIENT GOALS: Improve Balance  OBJECTIVE:  Note:  Objective measures were completed at Evaluation unless otherwise noted.  DIAGNOSTIC FINDINGS:   CT CERVICAL SPINE WITHOUT CONTRAST on 05/22/2023 IMPRESSION: Disc levels: Disc space narrowing and spurring in the lower cervical spine. Mild bilateral degenerative facet disease, left greater than right. Degenerative disc and facet disease throughout the cervical spine. No acute bony abnormality.  CT HEAD WITHOUT CONTRAST on 05/22/2023 FINDINGS: Brain: Deep brain stimulators are noted with the tips in the region of the thalami bilaterally, unchanged. Mild atrophy. No acute intracranial abnormality. Specifically, no hemorrhage, hydrocephalus, mass lesion, acute infarction, or significant intracranial injury. Vascular: No hyperdense vessel or unexpected calcification. Skull: No acute calvarial abnormality. Sinuses/Orbits: No acute findings Other: None IMPRESSION: No acute intracranial abnormality.  LEFT SHOULDER - 2+ VIEW on 05/22/2023 IMPRESSION: Acute mildly displaced fracture of the lateral aspect of the left clavicle with intra-articular extension.   COGNITION: Overall cognitive status: History of cognitive impairments - at baseline and pt with very delayed verbal responses to subjective questioning and noted to be easily distracted in the therapy gym environment   SENSATION: Light touch: Impaired  and decreased distally in B LEs, but specifically decreased throughout R LE compared to L LE   COORDINATION: Not formally assessed  EDEMA:  Not formally assessed, but no significant swelling on visual exam  MUSCLE TONE: WFL  MUSCLE LENGTH: Hamstrings: Right ____ deg; Left ___ deg  *not assessed, but would benefit from testing  DTRs:  Not formally assessed  POSTURE: rounded shoulders, forward head, decreased lumbar lordosis, and posterior pelvic tilt, posterior weight shift bias  LOWER EXTREMITY ROM:     Active  Not formally assessed Right Eval Left Eval  Hip  flexion    Hip extension    Hip abduction    Hip adduction    Hip internal rotation    Hip external rotation    Knee flexion    Knee extension  Ankle dorsiflexion Would benefit from testing Would benefit from testing  Ankle plantarflexion    Ankle inversion    Ankle eversion     (Blank rows = not tested)  LOWER EXTREMITY MMT:    MMT Right Eval Left Eval  Hip flexion 4 4  Hip extension    Hip abduction    Hip adduction    Hip internal rotation    Hip external rotation    Knee flexion 4+ 5  Knee extension 4+ 4+  Ankle dorsiflexion 4- 4  Ankle plantarflexion 4 4-  Ankle inversion    Ankle eversion    (Blank rows = not tested)  Manual Muscle Test Scale 0/5 = No muscle contraction can be seen or felt 1/5 = Contraction can be felt, but there is no motion 2-/5 = Part moves through incomplete ROM w/ gravity decreased 2/5 = Part moves through complete ROM w/ gravity decreased 2+/5 = Part moves through incomplete ROM (<50%) against gravity or through complete ROM w/ gravity 3-/5 = Part moves through incomplete ROM (>50%) against gravity 3/5 = Part moves through complete ROM against gravity 3+/5 = Part moves through complete ROM against gravity/slight resistance 4-/5= Holds test position against slight to moderate pressure 4/5 = Part moves through complete ROM against gravity/moderate resistance 4+/5= Holds test position against moderate to strong pressure 5/5 = Part moves through complete ROM against gravity/full resistance  BED MOBILITY:  Sit to supine Min A and Mod A Supine to sit Min A and Mod A Requires adjustable bed with bedrail at home   TRANSFERS: Assistive device utilized:  Up-Walker   Sit to stand: SBA, CGA, and Min A Stand to sit: SBA, CGA, and Min A Chair to chair: CGA and Min A Floor:  Not assessed **Requires variable assist levels for transfers due to imbalance  RAMP:  Level of Assistance:    Assistive device utilized:  Ramp Comments: Not formally  assessed, may benefit from testing in future  CURB:  Level of Assistance:  Assistive device utilized:  Curb Comments: Not formally assessed, may benefit from testing in future  STAIRS: Level of Assistance:  06/12/23: Min A Stair Negotiation Technique: Forwards with Bilateral Rails  Number of Stairs: 4  Height of Stairs: 6inch  Comments: 06/12/23 - posterior LOB at top of steps when turning around, minor posterior lean throughout  GAIT: Gait pattern: step through pattern, decreased step length- Right, decreased step length- Left, decreased stride length, knee flexed in stance- Right, knee flexed in stance- Left, shuffling, festinating, trunk flexed, wide BOS, poor foot clearance- Right, and poor foot clearance- Left Distance walked: ~125ft Assistive device utilized:  Up-Walker Level of assistance: CGA and Min A Comments: Noticeable festination when turning, stepping back to sit, or side stepping  FUNCTIONAL TESTS:  5 times sit to stand: 22 seconds Timed up and go (TUG): 06/12/23: 29.40 seconds using Up-Walker with CGA/min A 10 meter walk test: 0.82m/s Berg Balance Scale: 20/56  PATIENT SURVEYS:  ABC scale 06/12/23: 30%  TREATMENT DATE:  07/31/23   Pt arrived to therapy ambulating with Up-Walker with close SBA for safety.   Gait training ~349ft, no AD, with light min A for balance - continues to have narrow BOS, but not as significant as prior sessions, progressing to visual scanning the walls for post it notes  Dynamic Gait without AD:  - backwards walking length of hallway x2  - quick/sudden turning, ~4 turns for every length of hallway x2  - Head rotations/nods, length of hallway x2 each     Prone hip extensions with straight LE, 2x10 each LE  Attempted to perform bent knee, but unable to perform Supine B LE strengthening to improve upright posture  and gait including:   Supine bridging with slowed eccentric lowering, 2x10 Hooklying hip adduction into rainbow physioball, 3 sec holds, 2x10 Hooklying hip abduction against GTB resistance, 2x10 each LE      PATIENT EDUCATION: Education details: PT POC, LTGs, findings on balance assessments today Person educated: Patient and Caregiver Angelica Education method: Explanation Education comprehension: verbalized understanding and needs further education  HOME EXERCISE PROGRAM:   Access Code: [email or web address] URL: [email or web address] Date: 06/28/2023 Prepared by: Carlen Chasten  Exercises - Walking  - 1 x daily - 7 x weekly - 2 sets - 3-5 minutes hold - Sit to Stand  - 1 x daily - 7 x weekly - 2 sets - 12-15 reps - Side Stepping with Resistance at Thighs and Counter Support  - 1 x daily - 7 x weekly - 3 sets - 10 reps - Supine Bridge with Resistance Band  - 1 x daily - 7 x weekly - 2 sets - 10-15 reps - Clamshell  - 1 x daily - 7 x weekly - 3 sets - 10 reps  GOALS: Goals reviewed with patient? Yes  SHORT TERM GOALS: Target date: 07/13/2023    Patient will be independent in home exercise program to improve strength/mobility for better functional independence with ADLs. Baseline: 06/20/2023: HEP provided 06/28/2023: updated Goal status: ONGOING   2. Patient will report <2 falls over past 6 weeks to demonstrate improved safety awareness at home.   Baseline: 06/01/2023 = 10+ falls in past 6 months; 3/27: 10+ falls per patient report in past 6 weeks   Goal Status: ONGOING  LONG TERM GOALS: Target date: 08/24/2023   1.  Patient (> 2 years old) will complete five times sit to stand test in < 15 seconds indicating an increased LE strength and improved balance. Baseline: 06/01/23: 22 seconds with posterior LOB; 3/27: 11.51 seconds Goal status: MET  2.  Patient will increase Berg Balance score by > 6 points to demonstrate decreased fall risk during functional  activities. Baseline: 06/01/23: 20/56; 3/27: 23/56 Goal status: ONGOING   3. Patient will reduce timed up and go to <11 seconds to reduce fall risk and demonstrate improved transfer/gait ability. Baseline: 06/12/23: 29.40 seconds using Up-Walker with CGA/min A; 3/27: 22.29 seconds with Up-walker and posterior LOB upon standing  Goal status: ONGOING  4.Patient will increase 10 meter walk test to >1.3m/s as to improve gait speed for better community ambulation and to reduce fall risk. Baseline: 06/01/23: 0.31m/s using Up-Walker; 3/27: 0.89 m/s using Up Walker Goal status: ONGOING  5.  Patient will require no more than supervision assist with ascending/descending 3 steps using handrails per home set-up.  Baseline: 06/12/23: requires min A using HR due to posterior lean/LOB especially when turning at the top; 3/27: minA required using HR due to posterior  LOB on first step   Goal status: ONGOING   ASSESSMENT:  CLINICAL IMPRESSION:     Pt responded well to the treatment approach, however still has considerable reduction in balance when ambulating without an AD.  Pt noted to have considerable difficulty when lying prone and performing hip extension exercise.  Pt unable to perform with bent knee, but was able to engage and perform SLR.  Pt to continue to work on glute and R LE weakness at home as part of HEP and during future PT sessions.   Pt will continue to benefit from skilled therapy to address remaining deficits in order to improve overall QoL and return to PLOF.     OBJECTIVE IMPAIRMENTS: Abnormal gait, decreased activity tolerance, decreased balance, decreased cognition, decreased endurance, decreased knowledge of condition, decreased knowledge of use of DME, decreased mobility, difficulty walking, decreased strength, decreased safety awareness, hypomobility, impaired flexibility, impaired sensation, improper body mechanics, postural dysfunction, and pain.   ACTIVITY LIMITATIONS: carrying,  lifting, bending, standing, squatting, sleeping, stairs, transfers, bed mobility, continence, bathing, toileting, dressing, reach over head, hygiene/grooming, and locomotion level  PARTICIPATION LIMITATIONS: meal prep, cleaning, laundry, medication management, driving, and community activity  PERSONAL FACTORS: Age, Sex, Time since onset of injury/illness/exacerbation, and 3+ comorbidities: Parkinson's, deep brain stim, depression, lipids, HTN, GERD, ED, dementia, seizures/falls, LBP/PSF, hx of lumbar fusion in 04/2022, hx of hip fx 08/2022, arthritis, frequent falls,  and OSA  are also affecting patient's functional outcome.   REHAB POTENTIAL: Good  CLINICAL DECISION MAKING: Evolving/moderate complexity  EVALUATION COMPLEXITY: Moderate  PLAN:  PT FREQUENCY: 1-2x/week  PT DURATION: 12 weeks  PLANNED INTERVENTIONS: 97164- PT Re-evaluation, 97110-Therapeutic exercises, 97530- Therapeutic activity, 97112- Neuromuscular re-education, 97535- Self Care, 21308- Manual therapy, [phone]- Gait training, [phone]- Orthotic Fit/training, [phone]- Electrical stimulation (manual), Patient/Family education, Balance training, Stair training, Taping, Joint mobilization, Joint manipulation, Spinal mobilization, Vestibular training, Cognitive remediation, DME instructions, Cryotherapy, and Moist heat  PLAN FOR NEXT SESSION:  *add dual-task challenges to interventions* Balance:   - posterior stepping strategy training with R posterolateral stepping (stepping over 1/2 foam roll with theraband resistance to external target) Dynamic Gait without AD:  - backwards walking  - quick/sudden turning  - Head rotations  Strengthening:  - reinforce R hip and gluteal strengthening - bridge with resistance - clamshells! (Or supine hooklying hip abduction) - strengthening hips to create wider BOS for improved balance during gait    Rozanna Corner, PT, DPT Physical Therapist - [facility]   07/31/23, 1:18 PM

## 2023-07-31 ENCOUNTER — Ambulatory Visit: Payer: Medicare PPO

## 2023-07-31 DIAGNOSIS — R2681 Unsteadiness on feet: Secondary | ICD-10-CM

## 2023-07-31 DIAGNOSIS — R278 Other lack of coordination: Secondary | ICD-10-CM | POA: Diagnosis not present

## 2023-07-31 DIAGNOSIS — R269 Unspecified abnormalities of gait and mobility: Secondary | ICD-10-CM | POA: Diagnosis not present

## 2023-07-31 DIAGNOSIS — R296 Repeated falls: Secondary | ICD-10-CM | POA: Diagnosis not present

## 2023-07-31 DIAGNOSIS — G20A1 Parkinson's disease without dyskinesia, without mention of fluctuations: Secondary | ICD-10-CM | POA: Diagnosis not present

## 2023-07-31 DIAGNOSIS — M6281 Muscle weakness (generalized): Secondary | ICD-10-CM

## 2023-07-31 DIAGNOSIS — R2689 Other abnormalities of gait and mobility: Secondary | ICD-10-CM

## 2023-07-31 DIAGNOSIS — R262 Difficulty in walking, not elsewhere classified: Secondary | ICD-10-CM

## 2023-08-03 ENCOUNTER — Ambulatory Visit: Payer: Medicare PPO | Admitting: Physical Therapy

## 2023-08-03 DIAGNOSIS — R2689 Other abnormalities of gait and mobility: Secondary | ICD-10-CM | POA: Diagnosis not present

## 2023-08-03 DIAGNOSIS — R269 Unspecified abnormalities of gait and mobility: Secondary | ICD-10-CM | POA: Diagnosis not present

## 2023-08-03 DIAGNOSIS — R296 Repeated falls: Secondary | ICD-10-CM | POA: Diagnosis not present

## 2023-08-03 DIAGNOSIS — M6281 Muscle weakness (generalized): Secondary | ICD-10-CM

## 2023-08-03 DIAGNOSIS — R262 Difficulty in walking, not elsewhere classified: Secondary | ICD-10-CM

## 2023-08-03 DIAGNOSIS — R278 Other lack of coordination: Secondary | ICD-10-CM | POA: Diagnosis not present

## 2023-08-03 DIAGNOSIS — G20A1 Parkinson's disease without dyskinesia, without mention of fluctuations: Secondary | ICD-10-CM | POA: Diagnosis not present

## 2023-08-03 DIAGNOSIS — R2681 Unsteadiness on feet: Secondary | ICD-10-CM

## 2023-08-03 NOTE — Therapy (Signed)
 OUTPATIENT PHYSICAL THERAPY NEURO TREATMENT   Patient Name: [name] MRN: [id number] DOB:[birth year], 72 y.o., male Today's Date: 08/03/2023   PCP: Caffie Pinto, PA-C REFERRING PROVIDER: Suzy Bouchard, PA   END OF SESSION:    PT End of Session - 08/03/23 1153     Visit Number 15    Number of Visits 24    Date for PT Re-Evaluation 08/24/23    Authorization Type [employer] [id number]    PT Start Time 1153    PT Stop Time 1234    PT Time Calculation (min) 41 min    Equipment Utilized During Treatment Gait belt    Activity Tolerance Patient tolerated treatment well;No increased pain    Behavior During Therapy WFL for tasks assessed/performed;Flat affect                  Past Medical History:  Diagnosis Date   Anxiety disorder    Arthritis    Bradycardia    Cancer (HCC) 2013   skin cancer   Depression    ptsd   Dysrhythmia    chronic slow heart rate   GERD (gastroesophageal reflux disease)    H/O tachycardia-bradycardia syndrome    Headache(784.0)    tension headaches non recent   History of chicken pox    History of kidney stones    passed   Hypertension    treated with HCTZ   Major depressive disorder    Mild aortic regurgitation    Pacemaker    Parkinson's disease (HCC)    dx'ed 15 years ago   PTSD (post-traumatic stress disorder)    S/P deep brain stimulator placement    Shortness of breath dyspnea    Sleep apnea    doesn't use C-pap   Unspecified dementia, moderate, with psychotic disturbance (HCC)    Varicose veins    Past Surgical History:  Procedure Laterality Date   CATARACT EXTRACTION W/PHACO Right 01/17/2023   Procedure: CATARACT EXTRACTION PHACO AND INTRAOCULAR LENS PLACEMENT (IOC) RIGHT 6.33 00:42.6;  Surgeon: Galen Manila, MD;  Location: [facility];  Service: Ophthalmology;  Laterality: Right;   CATARACT EXTRACTION W/PHACO Left 02/07/2023   Procedure: CATARACT EXTRACTION PHACO AND INTRAOCULAR LENS PLACEMENT (IOC)  LEFT 6.35 00:38.3;  Surgeon: Galen Manila, MD;  Location: [facility];  Service: Ophthalmology;  Laterality: Left;   CHOLECYSTECTOMY N/A 10/22/2014   Procedure: LAPAROSCOPIC CHOLECYSTECTOMY WITH INTRAOPERATIVE CHOLANGIOGRAM;  Surgeon: Tiney Rouge III, MD;  Location: [facility];  Service: General;  Laterality: N/A;   cyst removed      from lip as a child   INTRAMEDULLARY (IM) NAIL INTERTROCHANTERIC N/A 02/18/2019   Procedure: INTRAMEDULLARY (IM) NAIL INTERTROCHANTRIC, RIGHT,;  Surgeon: Juanell Fairly, MD;  Location: [facility];  Service: Orthopedics;  Laterality: N/A;   LUMBAR LAMINECTOMY/DECOMPRESSION MICRODISCECTOMY Bilateral 12/14/2012   Procedure: Bilateral lumbar three-four, four-five decompressive laminotomy/foraminotomy;  Surgeon: Temple Pacini, MD;  Location: [facility];  Service: Neurosurgery;  Laterality: Bilateral;   PULSE GENERATOR IMPLANT Bilateral 12/13/2013   Procedure: Bilateral implantable pulse generator placement;  Surgeon: Maeola Harman, MD;  Location: [facility];  Service: Neurosurgery;  Laterality: Bilateral;  Bilateral implantable pulse generator placement   skin cancer removed     from ears,   12 lft arm  rt leg 15   SUBTHALAMIC STIMULATOR BATTERY REPLACEMENT Bilateral 07/14/2017   Procedure: BILATERAL IMPLANTED PULSE GENERATOR CHANGE FOR DEEP BRAIN STIMULATOR;  Surgeon: Maeola Harman, MD;  Location: [facility];  Service: Neurosurgery;  Laterality: Bilateral;  SUBTHALAMIC STIMULATOR INSERTION Bilateral 12/06/2013   Procedure: SUBTHALAMIC STIMULATOR INSERTION;  Surgeon: Maeola Harman, MD;  Location: [facility];  Service: Neurosurgery;  Laterality: Bilateral;  Bilateral deep brain stimulator placement   Patient Active Problem List   Diagnosis Date Noted   Hematuria 10/10/2021   Sinus drainage 12/09/2020   Medicare annual wellness visit, subsequent 11/18/2020   Pacemaker    Dementia associated with Parkinson's disease (HCC) 06/17/2020   Vertigo 02/19/2020   Urine  abnormality 02/19/2020   Dysphagia 02/23/2019   Fall at home 10/18/2017   REM behavioral disorder 11/02/2016   Radicular pain in right arm 10/09/2016   GERD (gastroesophageal reflux disease) 07/31/2016   Colon cancer screening 07/23/2015   Gout 02/18/2015   Depression 01/06/2015   Skin lesion 10/02/2014   Dupuytren's contracture 08/20/2014   Cough 07/21/2014   Lumbar stenosis with neurogenic claudication 06/13/2014   S/P deep brain stimulator placement 05/08/2014   Advance care planning 01/19/2014   Parkinson's disease (HCC) 12/13/2013   PTSD (post-traumatic stress disorder) 06/13/2013   Erectile dysfunction 06/13/2013   HLD (hyperlipidemia) 06/13/2013   Essential hypertension 06/03/2013   Bradycardia by electrocardiogram 06/03/2013   Obstructive sleep apnea 03/12/2013    ONSET DATE: ~15 years ago when pt was diagnosed with Parkinson's  REFERRING DIAG: G20.A1 (ICD-10-CM) - Parkinsons disease (HCC)   THERAPY DIAG:  Abnormality of gait and mobility  Unsteadiness on feet  Muscle weakness (generalized)  Repeated falls  Difficulty in walking, not elsewhere classified  Other abnormalities of gait and mobility  Other lack of coordination  Rationale for Evaluation and Treatment: Rehabilitation  SUBJECTIVE:                                                                                                                                                                                             SUBJECTIVE STATEMENT:  Pt denies any additional falls/stumbles since last therapy session. Pt wearing new tennis shoes and reports he is catching his toes on the back of his contralateral heel (due to narrow BOS). Pt reports overall doing good. Denies pain to start session. Reports doing his HEP.  *Of note: pt requires increased time for verbal responses to therapist's questions and pt is easily distracted in busy environment  Pt accompanied by: self and his caregiver (in waiting  room)  PERTINENT HISTORY: Parkinson's, deep brain stim, depression, lipids, HTN, GERD, ED, dementia, seizures/falls, LBP/PSF, hx of lumbar fusion in 04/2022, hx of hip fx 08/2022, arthritis, frequent falls,  and OSA   PAIN:  Are you having pain? Yes: NPRS scale: 7/10 with activity below, but otherwise 3/10 at rest  Pain location: L calvicle Pain description: ache to sharp Aggravating factors: when going to push-up to stand or control himself when sitting using armrest Relieving factors: rest and support in arm sling  PRECAUTIONS: Fall and Other:    frequent falls, most recent on 05/22/2023 with ED visit, L superolateral clavicular fx (out of sling on 06/15/23 due to pt/family discontinuing it), deep brain stimulator  RED FLAGS: Bowel or bladder incontinence: Yes: "on occasion" has incontinence    WEIGHT BEARING RESTRICTIONS: Yes unsure of specific L UE WBing restrictions, but pt currently using an AD for ambulation, while wearing basic arm sling  FALLS: Has patient fallen in last 6 months? Yes. Number of falls 10+  most recent on 05/22/2023 with injury to L shoulder  LIVING ENVIRONMENT: Lives with: lives with their spouse (wife, Radio broadcast assistant) and has hired caregivers Lives in: House/apartment Stairs: Yes: External: 2-3 steps; bilateral but cannot reach both (due to current home renovations, this is the entrance he uses) primarily uses R HR Has following equipment at home: Wheelchair (manual), shower chair, Grab bars, and Up-Walker  PLOF: Independent with household mobility with device, Requires assistive device for independence, Needs assistance with ADLs, and meal prep   Pt reports he frequently leaves his AD to the side when going to get something, often forgetting it - he requires reinforced education on consistent use of U-step walker  PATIENT GOALS: Improve Balance  OBJECTIVE:  Note: Objective measures were completed at Evaluation unless otherwise noted.  DIAGNOSTIC FINDINGS:   CT  CERVICAL SPINE WITHOUT CONTRAST on 05/22/2023 IMPRESSION: Disc levels: Disc space narrowing and spurring in the lower cervical spine. Mild bilateral degenerative facet disease, left greater than right. Degenerative disc and facet disease throughout the cervical spine. No acute bony abnormality.  CT HEAD WITHOUT CONTRAST on 05/22/2023 FINDINGS: Brain: Deep brain stimulators are noted with the tips in the region of the thalami bilaterally, unchanged. Mild atrophy. No acute intracranial abnormality. Specifically, no hemorrhage, hydrocephalus, mass lesion, acute infarction, or significant intracranial injury. Vascular: No hyperdense vessel or unexpected calcification. Skull: No acute calvarial abnormality. Sinuses/Orbits: No acute findings Other: None IMPRESSION: No acute intracranial abnormality.  LEFT SHOULDER - 2+ VIEW on 05/22/2023 IMPRESSION: Acute mildly displaced fracture of the lateral aspect of the left clavicle with intra-articular extension.   COGNITION: Overall cognitive status: History of cognitive impairments - at baseline and pt with very delayed verbal responses to subjective questioning and noted to be easily distracted in the therapy gym environment   SENSATION: Light touch: Impaired  and decreased distally in B LEs, but specifically decreased throughout R LE compared to L LE   COORDINATION: Not formally assessed  EDEMA:  Not formally assessed, but no significant swelling on visual exam  MUSCLE TONE: WFL  MUSCLE LENGTH: Hamstrings: Right ____ deg; Left ___ deg  *not assessed, but would benefit from testing  DTRs:  Not formally assessed  POSTURE: rounded shoulders, forward head, decreased lumbar lordosis, and posterior pelvic tilt, posterior weight shift bias  LOWER EXTREMITY ROM:     Active  Not formally assessed Right Eval Left Eval  Hip flexion    Hip extension    Hip abduction    Hip adduction    Hip internal rotation    Hip external  rotation    Knee flexion    Knee extension    Ankle dorsiflexion Would benefit from testing Would benefit from testing  Ankle plantarflexion    Ankle inversion    Ankle eversion     (  Blank rows = not tested)  LOWER EXTREMITY MMT:    MMT Right Eval Left Eval  Hip flexion 4 4  Hip extension    Hip abduction    Hip adduction    Hip internal rotation    Hip external rotation    Knee flexion 4+ 5  Knee extension 4+ 4+  Ankle dorsiflexion 4- 4  Ankle plantarflexion 4 4-  Ankle inversion    Ankle eversion    (Blank rows = not tested)  Manual Muscle Test Scale 0/5 = No muscle contraction can be seen or felt 1/5 = Contraction can be felt, but there is no motion 2-/5 = Part moves through incomplete ROM w/ gravity decreased 2/5 = Part moves through complete ROM w/ gravity decreased 2+/5 = Part moves through incomplete ROM (<50%) against gravity or through complete ROM w/ gravity 3-/5 = Part moves through incomplete ROM (>50%) against gravity 3/5 = Part moves through complete ROM against gravity 3+/5 = Part moves through complete ROM against gravity/slight resistance 4-/5= Holds test position against slight to moderate pressure 4/5 = Part moves through complete ROM against gravity/moderate resistance 4+/5= Holds test position against moderate to strong pressure 5/5 = Part moves through complete ROM against gravity/full resistance  BED MOBILITY:  Sit to supine Min A and Mod A Supine to sit Min A and Mod A Requires adjustable bed with bedrail at home   TRANSFERS: Assistive device utilized:  Up-Walker   Sit to stand: SBA, CGA, and Min A Stand to sit: SBA, CGA, and Min A Chair to chair: CGA and Min A Floor:  Not assessed **Requires variable assist levels for transfers due to imbalance  RAMP:  Level of Assistance:    Assistive device utilized:  Ramp Comments: Not formally assessed, may benefit from testing in future  CURB:  Level of Assistance:  Assistive device utilized:   Curb Comments: Not formally assessed, may benefit from testing in future  STAIRS: Level of Assistance:  06/12/23: Min A Stair Negotiation Technique: Forwards with Bilateral Rails  Number of Stairs: 4  Height of Stairs: 6inch  Comments: 06/12/23 - posterior LOB at top of steps when turning around, minor posterior lean throughout  GAIT: Gait pattern: step through pattern, decreased step length- Right, decreased step length- Left, decreased stride length, knee flexed in stance- Right, knee flexed in stance- Left, shuffling, festinating, trunk flexed, wide BOS, poor foot clearance- Right, and poor foot clearance- Left Distance walked: ~113ft Assistive device utilized:  Up-Walker Level of assistance: CGA and Min A Comments: Noticeable festination when turning, stepping back to sit, or side stepping  FUNCTIONAL TESTS:  5 times sit to stand: 22 seconds Timed up and go (TUG): 06/12/23: 29.40 seconds using Up-Walker with CGA/min A 10 meter walk test: 0.90m/s Berg Balance Scale: 20/56  PATIENT SURVEYS:  ABC scale 06/12/23: 30%  TREATMENT DATE:  08/03/23  Pt arrived to therapy ambulating with Up-Walker with SBA for safety - noticed with pt's new shoes and his narrow BOS, he frequently catches the toes of one foot on the back of contralateral heel.   Gait training ~136ft, no AD, with light min A for balance - reports onset of R buttocks pain.   - continues to demo narrow BOS (but not as significant as when using AD  - continues to have hip instability (R>L) with excessive posterior pelvic tilt   Supine on mat, pt reports he can feel that pain in his R buttocks when just performing supine isometric glute squeezes with B LEs extended - initiated bridging x8 reps with pt reporting he can still feel R buttocks discomfort "a little"  Transitioned to L sidelying and performed  ~75min of R gluteal region soft tissue mobilization - pt with jump response and pain to palpation of R ischial tuberosity.   L sidelying, R LE clamshells x10reps then progressed to adding level 2 red theraband resistance for additional 10 reps  Returned to supine bridging with level 2 red theraband resistance around knees 2x 10 reps - continues to require multimodal cuing to keep R hip abductor activated while lifting hips  Gait training additional 184ft, no AD, with pt reporting improvement in R hip pain after above interventions.   Dynamic gait training with dual-task challenge using 6 Blaze Pods set in a row on either side of pt (making an isle) to walk forward/backwards ~20ft inside of to get to the pods - set Pods on random to promote sudden turning and reaching with cognitive dual-task to hit L hand to blue and R hand to red wearing boxing gloves to promote quicker "punching" movements to cause perturbations  - requires consistent CGA with a few instances of heavy min A for balance  - with fatigue requires increased cuing to recall which color to hit with each hand - 2x 3 min (28 hits and 26 hits)    PATIENT EDUCATION: Education details: PT POC, LTGs, findings on balance assessments today Person educated: Patient and Caregiver Angelica Education method: Explanation Education comprehension: verbalized understanding and needs further education  HOME EXERCISE PROGRAM:   Access Code: [email or web address] URL: [email or web address] Date: 06/28/2023 Prepared by: Carlen Chasten  Exercises - Walking  - 1 x daily - 7 x weekly - 2 sets - 3-5 minutes hold - Sit to Stand  - 1 x daily - 7 x weekly - 2 sets - 12-15 reps - Side Stepping with Resistance at Thighs and Counter Support  - 1 x daily - 7 x weekly - 3 sets - 10 reps - Supine Bridge with Resistance Band  - 1 x daily - 7 x weekly - 2 sets - 10-15 reps - Clamshell  - 1 x daily - 7 x weekly - 3 sets - 10 reps  GOALS: Goals reviewed with  patient? Yes  SHORT TERM GOALS: Target date: 07/13/2023    Patient will be independent in home exercise program to improve strength/mobility for better functional independence with ADLs. Baseline: 06/20/2023: HEP provided 06/28/2023: updated Goal status: ONGOING   2. Patient will report <2 falls over past 6 weeks to demonstrate improved safety awareness at home.   Baseline: 06/01/2023 = 10+ falls in past 6 months; 3/27: 10+ falls per patient report in past 6 weeks   Goal Status: ONGOING  LONG TERM GOALS: Target date: 08/24/2023   1.  Patient (> 34 years old) will  complete five times sit to stand test in < 15 seconds indicating an increased LE strength and improved balance. Baseline: 06/01/23: 22 seconds with posterior LOB; 3/27: 11.51 seconds Goal status: MET  2.  Patient will increase Berg Balance score by > 6 points to demonstrate decreased fall risk during functional activities. Baseline: 06/01/23: 20/56; 3/27: 23/56 Goal status: ONGOING   3. Patient will reduce timed up and go to <11 seconds to reduce fall risk and demonstrate improved transfer/gait ability. Baseline: 06/12/23: 29.40 seconds using Up-Walker with CGA/min A; 3/27: 22.29 seconds with Up-walker and posterior LOB upon standing  Goal status: ONGOING  4.Patient will increase 10 meter walk test to >1.12m/s as to improve gait speed for better community ambulation and to reduce fall risk. Baseline: 06/01/23: 0.42m/s using Up-Walker; 3/27: 0.89 m/s using Up Walker Goal status: ONGOING  5.  Patient will require no more than supervision assist with ascending/descending 3 steps using handrails per home set-up.  Baseline: 06/12/23: requires min A using HR due to posterior lean/LOB especially when turning at the top; 3/27: minA required using HR due to posterior LOB on first step   Goal status: ONGOING   ASSESSMENT:  CLINICAL IMPRESSION:    Patient arrives to treatment session motivated to participate. During session, pt  participated in hip strengthening to decrease his R buttocks pain and improve his balance and stability during standing and ambulatory tasks. Pt continues to demo bilateral (R>L) hip weakness that results in balance instability during functional mobility, including gait. He reports improvement in R hip pain following interventions today. Performed cognitive dual-task challenge during forward/backwards gait involving sudden stops with turning to hit [employer] wearing boxing gloves - pt demos only a few instances of large LOB requiring heavy min A to remain upright.  Mr. Mark will benefit from continued skilled physical therapy to further address his balance deficits in order to decrease his fall risk and promote increased independence with functional mobility and ADLs.   OBJECTIVE IMPAIRMENTS: Abnormal gait, decreased activity tolerance, decreased balance, decreased cognition, decreased endurance, decreased knowledge of condition, decreased knowledge of use of DME, decreased mobility, difficulty walking, decreased strength, decreased safety awareness, hypomobility, impaired flexibility, impaired sensation, improper body mechanics, postural dysfunction, and pain.   ACTIVITY LIMITATIONS: carrying, lifting, bending, standing, squatting, sleeping, stairs, transfers, bed mobility, continence, bathing, toileting, dressing, reach over head, hygiene/grooming, and locomotion level  PARTICIPATION LIMITATIONS: meal prep, cleaning, laundry, medication management, driving, and community activity  PERSONAL FACTORS: Age, Sex, Time since onset of injury/illness/exacerbation, and 3+ comorbidities: Parkinson's, deep brain stim, depression, lipids, HTN, GERD, ED, dementia, seizures/falls, LBP/PSF, hx of lumbar fusion in 04/2022, hx of hip fx 08/2022, arthritis, frequent falls,  and OSA  are also affecting patient's functional outcome.   REHAB POTENTIAL: Good  CLINICAL DECISION MAKING: Evolving/moderate  complexity  EVALUATION COMPLEXITY: Moderate  PLAN:  PT FREQUENCY: 1-2x/week  PT DURATION: 12 weeks  PLANNED INTERVENTIONS: 97164- PT Re-evaluation, 97110-Therapeutic exercises, 97530- Therapeutic activity, 97112- Neuromuscular re-education, 97535- Self Care, 16109- Manual therapy, [id number]- Gait training, [phone]- Orthotic Fit/training, [phone]- Electrical stimulation (manual), Patient/Family education, Balance training, Stair training, Taping, Joint mobilization, Joint manipulation, Spinal mobilization, Vestibular training, Cognitive remediation, DME instructions, Cryotherapy, and Moist heat  PLAN FOR NEXT SESSION:   *add dual-task challenges to interventions* Balance:   - posterior stepping strategy training with R posterolateral stepping (stepping over 1/2 foam roll with theraband resistance to external target) Dynamic Gait without AD:  - backwards walking  - quick/sudden turning  - Head  rotations  Strengthening:  - reinforce R hip and gluteal strengthening - bridge with resistance - clamshells! (Or supine hooklying hip abduction) - strengthening hips to create wider BOS for improved balance during gait    Boots Mcglown Corinthia Dickinson, PT, DPT Physical Therapist - [facility]  [facility]  5:24 PM 08/03/23

## 2023-08-07 ENCOUNTER — Ambulatory Visit: Payer: Medicare PPO | Admitting: Physical Therapy

## 2023-08-07 DIAGNOSIS — R269 Unspecified abnormalities of gait and mobility: Secondary | ICD-10-CM | POA: Diagnosis not present

## 2023-08-07 DIAGNOSIS — R278 Other lack of coordination: Secondary | ICD-10-CM

## 2023-08-07 DIAGNOSIS — G20A1 Parkinson's disease without dyskinesia, without mention of fluctuations: Secondary | ICD-10-CM | POA: Diagnosis not present

## 2023-08-07 DIAGNOSIS — R2689 Other abnormalities of gait and mobility: Secondary | ICD-10-CM | POA: Diagnosis not present

## 2023-08-07 DIAGNOSIS — R296 Repeated falls: Secondary | ICD-10-CM

## 2023-08-07 DIAGNOSIS — R262 Difficulty in walking, not elsewhere classified: Secondary | ICD-10-CM

## 2023-08-07 DIAGNOSIS — R2681 Unsteadiness on feet: Secondary | ICD-10-CM

## 2023-08-07 DIAGNOSIS — M6281 Muscle weakness (generalized): Secondary | ICD-10-CM | POA: Diagnosis not present

## 2023-08-07 NOTE — Therapy (Signed)
 OUTPATIENT PHYSICAL THERAPY NEURO TREATMENT   Patient Name: [name] MRN: [id number] DOB:May 20, 1950, 73 y.o., male Today's Date: 08/07/2023   PCP: Adelita Honer, PA-C REFERRING PROVIDER: Jacumin, Erika Leigh, PA   END OF SESSION:    PT End of Session - 08/07/23 1102     Visit Number 16    Number of Visits 24    Date for PT Re-Evaluation 08/24/23    Authorization Type [employer] [id number]    PT Start Time 1104    PT Stop Time 1145    PT Time Calculation (min) 41 min    Equipment Utilized During Treatment Gait belt    Activity Tolerance Patient tolerated treatment well;No increased pain    Behavior During Therapy WFL for tasks assessed/performed;Flat affect                   Past Medical History:  Diagnosis Date   Anxiety disorder    Arthritis    Bradycardia    Cancer (HCC) 2013   skin cancer   Depression    ptsd   Dysrhythmia    chronic slow heart rate   GERD (gastroesophageal reflux disease)    H/O tachycardia-bradycardia syndrome    Headache(784.0)    tension headaches non recent   History of chicken pox    History of kidney stones    passed   Hypertension    treated with HCTZ   Major depressive disorder    Mild aortic regurgitation    Pacemaker    Parkinson's disease (HCC)    dx'ed 15 years ago   PTSD (post-traumatic stress disorder)    S/P deep brain stimulator placement    Shortness of breath dyspnea    Sleep apnea    doesn't use C-pap   Unspecified dementia, moderate, with psychotic disturbance (HCC)    Varicose veins    Past Surgical History:  Procedure Laterality Date   CATARACT EXTRACTION W/PHACO Right 01/17/2023   Procedure: CATARACT EXTRACTION PHACO AND INTRAOCULAR LENS PLACEMENT (IOC) RIGHT 6.33 00:42.6;  Surgeon: Clair Crews, MD;  Location: [facility];  Service: Ophthalmology;  Laterality: Right;   CATARACT EXTRACTION W/PHACO Left 02/07/2023   Procedure: CATARACT EXTRACTION PHACO AND INTRAOCULAR LENS PLACEMENT (IOC)  LEFT 6.35 00:38.3;  Surgeon: Clair Crews, MD;  Location: [facility];  Service: Ophthalmology;  Laterality: Left;   CHOLECYSTECTOMY N/A 10/22/2014   Procedure: LAPAROSCOPIC CHOLECYSTECTOMY WITH INTRAOPERATIVE CHOLANGIOGRAM;  Surgeon: Rhina Center III, MD;  Location: [facility];  Service: General;  Laterality: N/A;   cyst removed      from lip as a child   INTRAMEDULLARY (IM) NAIL INTERTROCHANTERIC N/A 02/18/2019   Procedure: INTRAMEDULLARY (IM) NAIL INTERTROCHANTRIC, RIGHT,;  Surgeon: Rande Bushy, MD;  Location: [facility];  Service: Orthopedics;  Laterality: N/A;   LUMBAR LAMINECTOMY/DECOMPRESSION MICRODISCECTOMY Bilateral 12/14/2012   Procedure: Bilateral lumbar three-four, four-five decompressive laminotomy/foraminotomy;  Surgeon: Baruch Bosch, MD;  Location: [facility];  Service: Neurosurgery;  Laterality: Bilateral;   PULSE GENERATOR IMPLANT Bilateral 12/13/2013   Procedure: Bilateral implantable pulse generator placement;  Surgeon: Manya Sells, MD;  Location: [facility];  Service: Neurosurgery;  Laterality: Bilateral;  Bilateral implantable pulse generator placement   skin cancer removed     from ears,   12 lft arm  rt leg 15   SUBTHALAMIC STIMULATOR BATTERY REPLACEMENT Bilateral 07/14/2017   Procedure: BILATERAL IMPLANTED PULSE GENERATOR CHANGE FOR DEEP BRAIN STIMULATOR;  Surgeon: Manya Sells, MD;  Location: [facility];  Service: Neurosurgery;  Laterality:  Bilateral;   SUBTHALAMIC STIMULATOR INSERTION Bilateral 12/06/2013   Procedure: SUBTHALAMIC STIMULATOR INSERTION;  Surgeon: Manya Sells, MD;  Location: [facility];  Service: Neurosurgery;  Laterality: Bilateral;  Bilateral deep brain stimulator placement   Patient Active Problem List   Diagnosis Date Noted   Hematuria 10/10/2021   Sinus drainage 12/09/2020   Medicare annual wellness visit, subsequent 11/18/2020   Pacemaker    Dementia associated with Parkinson's disease (HCC) 06/17/2020   Vertigo 02/19/2020   Urine  abnormality 02/19/2020   Dysphagia 02/23/2019   Fall at home 10/18/2017   REM behavioral disorder 11/02/2016   Radicular pain in right arm 10/09/2016   GERD (gastroesophageal reflux disease) 07/31/2016   Colon cancer screening 07/23/2015   Gout 02/18/2015   Depression 01/06/2015   Skin lesion 10/02/2014   Dupuytren's contracture 08/20/2014   Cough 07/21/2014   Lumbar stenosis with neurogenic claudication 06/13/2014   S/P deep brain stimulator placement 05/08/2014   Advance care planning 01/19/2014   Parkinson's disease (HCC) 12/13/2013   PTSD (post-traumatic stress disorder) 06/13/2013   Erectile dysfunction 06/13/2013   HLD (hyperlipidemia) 06/13/2013   Essential hypertension 06/03/2013   Bradycardia by electrocardiogram 06/03/2013   Obstructive sleep apnea 03/12/2013    ONSET DATE: ~15 years ago when pt was diagnosed with Parkinson's  REFERRING DIAG: G20.A1 (ICD-10-CM) - Parkinsons disease (HCC)   THERAPY DIAG:  Abnormality of gait and mobility  Unsteadiness on feet  Muscle weakness (generalized)  Repeated falls  Difficulty in walking, not elsewhere classified  Other abnormalities of gait and mobility  Other lack of coordination  Rationale for Evaluation and Treatment: Rehabilitation  SUBJECTIVE:                                                                                                                                                                                             SUBJECTIVE STATEMENT:  Pt denies any additional falls/stumbles since last therapy session. Pt still wearing his new tennis shoes today. Pt reports overall doing well, but states was having R hip pain this morning. Reports having a busy weekend with guests over to his house.   *Of note: pt requires increased time for verbal responses to therapist's questions and pt is easily distracted in busy environment  Pt accompanied by: self and his caregiver (in waiting room)  PERTINENT HISTORY:  Parkinson's, deep brain stim, depression, lipids, HTN, GERD, ED, dementia, seizures/falls, LBP/PSF, hx of lumbar fusion in 04/2022, hx of hip fx 08/2022, arthritis, frequent falls,  and OSA   PAIN:  Are you having pain? Yes: NPRS scale: 7/10 with activity below, but otherwise 3/10 at rest  Pain location: L calvicle Pain description: ache to sharp Aggravating factors: when going to push-up to stand or control himself when sitting using armrest Relieving factors: rest and support in arm sling  PRECAUTIONS: Fall and Other:    frequent falls, most recent on 05/22/2023 with ED visit, L superolateral clavicular fx (out of sling on 06/15/23 due to pt/family discontinuing it), deep brain stimulator  RED FLAGS: Bowel or bladder incontinence: Yes: "on occasion" has incontinence    WEIGHT BEARING RESTRICTIONS: Yes unsure of specific L UE WBing restrictions, but pt currently using an AD for ambulation, while wearing basic arm sling  FALLS: Has patient fallen in last 6 months? Yes. Number of falls 10+  most recent on 05/22/2023 with injury to L shoulder  LIVING ENVIRONMENT: Lives with: lives with their spouse (wife, Radio broadcast assistant) and has hired caregivers Lives in: House/apartment Stairs: Yes: External: 2-3 steps; bilateral but cannot reach both (due to current home renovations, this is the entrance he uses) primarily uses R HR Has following equipment at home: Wheelchair (manual), shower chair, Grab bars, and Up-Walker  PLOF: Independent with household mobility with device, Requires assistive device for independence, Needs assistance with ADLs, and meal prep   Pt reports he frequently leaves his AD to the side when going to get something, often forgetting it - he requires reinforced education on consistent use of U-step walker  PATIENT GOALS: Improve Balance  OBJECTIVE:  Note: Objective measures were completed at Evaluation unless otherwise noted.  DIAGNOSTIC FINDINGS:   CT CERVICAL SPINE WITHOUT CONTRAST  on 05/22/2023 IMPRESSION: Disc levels: Disc space narrowing and spurring in the lower cervical spine. Mild bilateral degenerative facet disease, left greater than right. Degenerative disc and facet disease throughout the cervical spine. No acute bony abnormality.  CT HEAD WITHOUT CONTRAST on 05/22/2023 FINDINGS: Brain: Deep brain stimulators are noted with the tips in the region of the thalami bilaterally, unchanged. Mild atrophy. No acute intracranial abnormality. Specifically, no hemorrhage, hydrocephalus, mass lesion, acute infarction, or significant intracranial injury. Vascular: No hyperdense vessel or unexpected calcification. Skull: No acute calvarial abnormality. Sinuses/Orbits: No acute findings Other: None IMPRESSION: No acute intracranial abnormality.  LEFT SHOULDER - 2+ VIEW on 05/22/2023 IMPRESSION: Acute mildly displaced fracture of the lateral aspect of the left clavicle with intra-articular extension.   COGNITION: Overall cognitive status: History of cognitive impairments - at baseline and pt with very delayed verbal responses to subjective questioning and noted to be easily distracted in the therapy gym environment   SENSATION: Light touch: Impaired  and decreased distally in B LEs, but specifically decreased throughout R LE compared to L LE   COORDINATION: Not formally assessed  EDEMA:  Not formally assessed, but no significant swelling on visual exam  MUSCLE TONE: WFL  MUSCLE LENGTH: Hamstrings: Right ____ deg; Left ___ deg  *not assessed, but would benefit from testing  DTRs:  Not formally assessed  POSTURE: rounded shoulders, forward head, decreased lumbar lordosis, and posterior pelvic tilt, posterior weight shift bias  LOWER EXTREMITY ROM:     Active  Not formally assessed Right Eval Left Eval  Hip flexion    Hip extension    Hip abduction    Hip adduction    Hip internal rotation    Hip external rotation    Knee flexion    Knee  extension    Ankle dorsiflexion Would benefit from testing Would benefit from testing  Ankle plantarflexion    Ankle inversion    Ankle eversion     (  Blank rows = not tested)  LOWER EXTREMITY MMT:    MMT Right Eval Left Eval  Hip flexion 4 4  Hip extension    Hip abduction    Hip adduction    Hip internal rotation    Hip external rotation    Knee flexion 4+ 5  Knee extension 4+ 4+  Ankle dorsiflexion 4- 4  Ankle plantarflexion 4 4-  Ankle inversion    Ankle eversion    (Blank rows = not tested)  Manual Muscle Test Scale 0/5 = No muscle contraction can be seen or felt 1/5 = Contraction can be felt, but there is no motion 2-/5 = Part moves through incomplete ROM w/ gravity decreased 2/5 = Part moves through complete ROM w/ gravity decreased 2+/5 = Part moves through incomplete ROM (<50%) against gravity or through complete ROM w/ gravity 3-/5 = Part moves through incomplete ROM (>50%) against gravity 3/5 = Part moves through complete ROM against gravity 3+/5 = Part moves through complete ROM against gravity/slight resistance 4-/5= Holds test position against slight to moderate pressure 4/5 = Part moves through complete ROM against gravity/moderate resistance 4+/5= Holds test position against moderate to strong pressure 5/5 = Part moves through complete ROM against gravity/full resistance  BED MOBILITY:  Sit to supine Min A and Mod A Supine to sit Min A and Mod A Requires adjustable bed with bedrail at home   TRANSFERS: Assistive device utilized:  Up-Walker   Sit to stand: SBA, CGA, and Min A Stand to sit: SBA, CGA, and Min A Chair to chair: CGA and Min A Floor:  Not assessed **Requires variable assist levels for transfers due to imbalance  RAMP:  Level of Assistance:    Assistive device utilized:  Ramp Comments: Not formally assessed, may benefit from testing in future  CURB:  Level of Assistance:  Assistive device utilized:  Curb Comments: Not formally  assessed, may benefit from testing in future  STAIRS: Level of Assistance:  06/12/23: Min A Stair Negotiation Technique: Forwards with Bilateral Rails  Number of Stairs: 4  Height of Stairs: 6inch  Comments: 06/12/23 - posterior LOB at top of steps when turning around, minor posterior lean throughout  GAIT: Gait pattern: step through pattern, decreased step length- Right, decreased step length- Left, decreased stride length, knee flexed in stance- Right, knee flexed in stance- Left, shuffling, festinating, trunk flexed, wide BOS, poor foot clearance- Right, and poor foot clearance- Left Distance walked: ~112ft Assistive device utilized:  Up-Walker Level of assistance: CGA and Min A Comments: Noticeable festination when turning, stepping back to sit, or side stepping  FUNCTIONAL TESTS:  5 times sit to stand: 22 seconds Timed up and go (TUG): 06/12/23: 29.40 seconds using Up-Walker with CGA/min A 10 meter walk test: 0.11m/s Berg Balance Scale: 20/56  PATIENT SURVEYS:  ABC scale 06/12/23: 30%  TREATMENT DATE:  08/07/23  Pt arrived to therapy in transport chair.   Gait training ~366ft, no AD, with light min A progressing to heavy min/light mod A for balance with fatigue and increasing low back/hip pain  - continues to demo narrow BOS   - continues to have hip instability (R>L) that worsens with fatigue as well as excessive posterior pelvic tilt   - with fatigue has worsening narrow BOS and hip instability and increased crouched posture   - included ~13ft x2 of backwards gait requiring heavier min A on 2nd set with fatigue towards end of gait, but was previously able to maintain reciprocal stepping pattern with lighter min A  Supine hooklying hip abduction against level 2 RTB resistance x20reps working on endurance of the muscles Supine hooklying bridges with level  2 RTB resistance around knees to activate hip abductors 2x 20reps - continues to benefit from tactile/visual cuing to keep R hip abductors activated while lifting/lowering in the bridge *Denies pain with these exercises and reports not feeling a strong muscle burn with this indicating muscles are becoming stronger  Pt remembering to move his wallet from his back R pocket to his front R pocket when he sees therapist to avoid prolonged sitting on his wallet increasing his buttocks pain.  Returned to gait training as above ~31ft including ~34ft x2 of backwards gait - continues to have progressively more narrow BOS with increased hip instability resulting in progressively worsening balance requiring heavier min A to light mod A - pt would benefit from continued gait endurance training within pain tolerance Pt continues to reports pain in his wrists and across low back that increases with increased gait distance, associated with progressive deterioration of gait mechanics   Forward/backwards stepping over 1/2 foam roll with dual-task using 4 Blaze Pods set on a sequence (2 in front of foam roll on chairs on either side, and 2 behind patient on either side) with task of tapping pod behind him with his foot, then stepping over 1/2 foam roll and hitting pod with hand in boxing gloves  - 31min30sec reaching 19 targets - requires heavy min progressed to mod A for balance due to posterior LOB and delayed/inadequate use of hip and/or stepping strategy to maintain upright  Regressed activity to only forward/backwards stepping over 1/2 foam roll to improve balance recovery strategies to prevent posterior LOB when stepping backwards - continues to require heavy mod A with delayed and insufficient balance recovery strategies with persistent posterior lean.   PATIENT EDUCATION: Education details: PT POC, LTGs, findings on balance assessments today Person educated: Patient and Caregiver Angelica Education method:  Explanation Education comprehension: verbalized understanding and needs further education  HOME EXERCISE PROGRAM:   Access Code: [email or web address] URL: [email or web address] Date: 06/28/2023 Prepared by: Carlen Chasten  Exercises - Walking  - 1 x daily - 7 x weekly - 2 sets - 3-5 minutes hold - Sit to Stand  - 1 x daily - 7 x weekly - 2 sets - 12-15 reps - Side Stepping with Resistance at Thighs and Counter Support  - 1 x daily - 7 x weekly - 3 sets - 10 reps - Supine Bridge with Resistance Band  - 1 x daily - 7 x weekly - 2 sets - 10-15 reps - Clamshell  - 1 x daily - 7 x weekly - 3 sets - 10 reps  GOALS: Goals reviewed with patient? Yes  SHORT TERM GOALS: Target date: 07/13/2023    Patient will be independent  in home exercise program to improve strength/mobility for better functional independence with ADLs. Baseline: 06/20/2023: HEP provided 06/28/2023: updated Goal status: ONGOING   2. Patient will report <2 falls over past 6 weeks to demonstrate improved safety awareness at home.   Baseline: 06/01/2023 = 10+ falls in past 6 months; 3/27: 10+ falls per patient report in past 6 weeks   Goal Status: ONGOING  LONG TERM GOALS: Target date: 08/24/2023   1.  Patient (> 32 years old) will complete five times sit to stand test in < 15 seconds indicating an increased LE strength and improved balance. Baseline: 06/01/23: 22 seconds with posterior LOB; 3/27: 11.51 seconds Goal status: MET  2.  Patient will increase Berg Balance score by > 6 points to demonstrate decreased fall risk during functional activities. Baseline: 06/01/23: 20/56; 3/27: 23/56 Goal status: ONGOING   3. Patient will reduce timed up and go to <11 seconds to reduce fall risk and demonstrate improved transfer/gait ability. Baseline: 06/12/23: 29.40 seconds using Up-Walker with CGA/min A; 3/27: 22.29 seconds with Up-walker and posterior LOB upon standing  Goal status: ONGOING  4.Patient will increase 10 meter walk  test to >1.74m/s as to improve gait speed for better community ambulation and to reduce fall risk. Baseline: 06/01/23: 0.32m/s using Up-Walker; 3/27: 0.89 m/s using Up Walker Goal status: ONGOING  5.  Patient will require no more than supervision assist with ascending/descending 3 steps using handrails per home set-up.  Baseline: 06/12/23: requires min A using HR due to posterior lean/LOB especially when turning at the top; 3/27: minA required using HR due to posterior LOB on first step   Goal status: ONGOING   ASSESSMENT:  CLINICAL IMPRESSION:    Patient arrives to treatment session motivated to participate. Pt continues to demo bilateral (R>L) hip weakness that results in balance instability during functional mobility, including gait that worsens with increased gait distance. Participated in gait training targeting increased distance to improve overall endurance as well as dynamic gait including backwards walking with pt requiring progressively increased assistance due to muscle fatigue and onset of back pain with prolonged walking that resulted in worsening gait mechanics. Performed cognitive dual-task challenge during forward/backwards stepping over obstacle with challenge of foot taps and hand "punches" to Blaze Pods for increased balance challenge. Mr. Cominsky will benefit from continued skilled physical therapy to further address his balance deficits in order to decrease his fall risk and promote increased independence with functional mobility and ADLs.   OBJECTIVE IMPAIRMENTS: Abnormal gait, decreased activity tolerance, decreased balance, decreased cognition, decreased endurance, decreased knowledge of condition, decreased knowledge of use of DME, decreased mobility, difficulty walking, decreased strength, decreased safety awareness, hypomobility, impaired flexibility, impaired sensation, improper body mechanics, postural dysfunction, and pain.   ACTIVITY LIMITATIONS: carrying, lifting,  bending, standing, squatting, sleeping, stairs, transfers, bed mobility, continence, bathing, toileting, dressing, reach over head, hygiene/grooming, and locomotion level  PARTICIPATION LIMITATIONS: meal prep, cleaning, laundry, medication management, driving, and community activity  PERSONAL FACTORS: Age, Sex, Time since onset of injury/illness/exacerbation, and 3+ comorbidities: Parkinson's, deep brain stim, depression, lipids, HTN, GERD, ED, dementia, seizures/falls, LBP/PSF, hx of lumbar fusion in 04/2022, hx of hip fx 08/2022, arthritis, frequent falls,  and OSA  are also affecting patient's functional outcome.   REHAB POTENTIAL: Good  CLINICAL DECISION MAKING: Evolving/moderate complexity  EVALUATION COMPLEXITY: Moderate  PLAN:  PT FREQUENCY: 1-2x/week  PT DURATION: 12 weeks  PLANNED INTERVENTIONS: 97164- PT Re-evaluation, 97110-Therapeutic exercises, 97530- Therapeutic activity, [id number]- Neuromuscular re-education, 97535- Self Care,  16109- Manual therapy, [id number]- Gait training, 60454- Orthotic Fit/training, [id number]- Electrical stimulation (manual), Patient/Family education, Balance training, Stair training, Taping, Joint mobilization, Joint manipulation, Spinal mobilization, Vestibular training, Cognitive remediation, DME instructions, Cryotherapy, and Moist heat  PLAN FOR NEXT SESSION:   *add dual-task challenges to interventions* Balance:   - posterior stepping strategy training with R posterolateral stepping (stepping over 1/2 foam roll with theraband resistance to external target) Dynamic Gait without AD:  - backwards walking  - quick/sudden turning  - Head rotations  Strengthening:  - reinforce R hip and gluteal strengthening - bridge with resistance - clamshells! (Or supine hooklying hip abduction) - strengthening hips to create wider BOS for improved balance during gait    Rithwik Schmieg, PT, DPT, NCS, CSRS Physical Therapist - [facility]  [facility]  11:47 AM 08/07/23

## 2023-08-08 ENCOUNTER — Ambulatory Visit: Payer: Medicare PPO | Admitting: Psychology

## 2023-08-10 ENCOUNTER — Ambulatory Visit: Payer: Medicare PPO | Admitting: Physical Therapy

## 2023-08-10 DIAGNOSIS — R269 Unspecified abnormalities of gait and mobility: Secondary | ICD-10-CM

## 2023-08-10 DIAGNOSIS — R278 Other lack of coordination: Secondary | ICD-10-CM | POA: Diagnosis not present

## 2023-08-10 DIAGNOSIS — R2689 Other abnormalities of gait and mobility: Secondary | ICD-10-CM | POA: Diagnosis not present

## 2023-08-10 DIAGNOSIS — R262 Difficulty in walking, not elsewhere classified: Secondary | ICD-10-CM | POA: Diagnosis not present

## 2023-08-10 DIAGNOSIS — R296 Repeated falls: Secondary | ICD-10-CM

## 2023-08-10 DIAGNOSIS — R2681 Unsteadiness on feet: Secondary | ICD-10-CM | POA: Diagnosis not present

## 2023-08-10 DIAGNOSIS — M6281 Muscle weakness (generalized): Secondary | ICD-10-CM | POA: Diagnosis not present

## 2023-08-10 DIAGNOSIS — G20A1 Parkinson's disease without dyskinesia, without mention of fluctuations: Secondary | ICD-10-CM | POA: Diagnosis not present

## 2023-08-10 NOTE — Therapy (Signed)
 OUTPATIENT PHYSICAL THERAPY NEURO TREATMENT   Patient Name: [name] MRN: [id number] DOB:1950/08/17, 73 y.o., male Today's Date: 08/10/2023   PCP: Adelita Honer, PA-C REFERRING PROVIDER: Jacumin, Erika Leigh, PA   END OF SESSION:    PT End of Session - 08/10/23 1129     Visit Number 17    Number of Visits 24    Date for PT Re-Evaluation 08/24/23    Authorization Type [employer] [id number]    PT Start Time 1140    PT Stop Time 1230    PT Time Calculation (min) 50 min    Equipment Utilized During Treatment Gait belt    Activity Tolerance Patient tolerated treatment well;No increased pain    Behavior During Therapy WFL for tasks assessed/performed                    Past Medical History:  Diagnosis Date   Anxiety disorder    Arthritis    Bradycardia    Cancer (HCC) 2013   skin cancer   Depression    ptsd   Dysrhythmia    chronic slow heart rate   GERD (gastroesophageal reflux disease)    H/O tachycardia-bradycardia syndrome    Headache(784.0)    tension headaches non recent   History of chicken pox    History of kidney stones    passed   Hypertension    treated with HCTZ   Major depressive disorder    Mild aortic regurgitation    Pacemaker    Parkinson's disease (HCC)    dx'ed 15 years ago   PTSD (post-traumatic stress disorder)    S/P deep brain stimulator placement    Shortness of breath dyspnea    Sleep apnea    doesn't use C-pap   Unspecified dementia, moderate, with psychotic disturbance (HCC)    Varicose veins    Past Surgical History:  Procedure Laterality Date   CATARACT EXTRACTION W/PHACO Right 01/17/2023   Procedure: CATARACT EXTRACTION PHACO AND INTRAOCULAR LENS PLACEMENT (IOC) RIGHT 6.33 00:42.6;  Surgeon: Clair Crews, MD;  Location: [facility];  Service: Ophthalmology;  Laterality: Right;   CATARACT EXTRACTION W/PHACO Left 02/07/2023   Procedure: CATARACT EXTRACTION PHACO AND INTRAOCULAR LENS PLACEMENT (IOC) LEFT 6.35  00:38.3;  Surgeon: Clair Crews, MD;  Location: [facility];  Service: Ophthalmology;  Laterality: Left;   CHOLECYSTECTOMY N/A 10/22/2014   Procedure: LAPAROSCOPIC CHOLECYSTECTOMY WITH INTRAOPERATIVE CHOLANGIOGRAM;  Surgeon: Rhina Center III, MD;  Location: [facility];  Service: General;  Laterality: N/A;   cyst removed      from lip as a child   INTRAMEDULLARY (IM) NAIL INTERTROCHANTERIC N/A 02/18/2019   Procedure: INTRAMEDULLARY (IM) NAIL INTERTROCHANTRIC, RIGHT,;  Surgeon: Rande Bushy, MD;  Location: [facility];  Service: Orthopedics;  Laterality: N/A;   LUMBAR LAMINECTOMY/DECOMPRESSION MICRODISCECTOMY Bilateral 12/14/2012   Procedure: Bilateral lumbar three-four, four-five decompressive laminotomy/foraminotomy;  Surgeon: Baruch Bosch, MD;  Location: [facility];  Service: Neurosurgery;  Laterality: Bilateral;   PULSE GENERATOR IMPLANT Bilateral 12/13/2013   Procedure: Bilateral implantable pulse generator placement;  Surgeon: Manya Sells, MD;  Location: [facility];  Service: Neurosurgery;  Laterality: Bilateral;  Bilateral implantable pulse generator placement   skin cancer removed     from ears,   12 lft arm  rt leg 15   SUBTHALAMIC STIMULATOR BATTERY REPLACEMENT Bilateral 07/14/2017   Procedure: BILATERAL IMPLANTED PULSE GENERATOR CHANGE FOR DEEP BRAIN STIMULATOR;  Surgeon: Manya Sells, MD;  Location: [facility];  Service: Neurosurgery;  Laterality:  Bilateral;   SUBTHALAMIC STIMULATOR INSERTION Bilateral 12/06/2013   Procedure: SUBTHALAMIC STIMULATOR INSERTION;  Surgeon: Manya Sells, MD;  Location: [facility];  Service: Neurosurgery;  Laterality: Bilateral;  Bilateral deep brain stimulator placement   Patient Active Problem List   Diagnosis Date Noted   Hematuria 10/10/2021   Sinus drainage 12/09/2020   Medicare annual wellness visit, subsequent 11/18/2020   Pacemaker    Dementia associated with Parkinson's disease (HCC) 06/17/2020   Vertigo 02/19/2020   Urine abnormality  02/19/2020   Dysphagia 02/23/2019   Fall at home 10/18/2017   REM behavioral disorder 11/02/2016   Radicular pain in right arm 10/09/2016   GERD (gastroesophageal reflux disease) 07/31/2016   Colon cancer screening 07/23/2015   Gout 02/18/2015   Depression 01/06/2015   Skin lesion 10/02/2014   Dupuytren's contracture 08/20/2014   Cough 07/21/2014   Lumbar stenosis with neurogenic claudication 06/13/2014   S/P deep brain stimulator placement 05/08/2014   Advance care planning 01/19/2014   Parkinson's disease (HCC) 12/13/2013   PTSD (post-traumatic stress disorder) 06/13/2013   Erectile dysfunction 06/13/2013   HLD (hyperlipidemia) 06/13/2013   Essential hypertension 06/03/2013   Bradycardia by electrocardiogram 06/03/2013   Obstructive sleep apnea 03/12/2013    ONSET DATE: ~15 years ago when pt was diagnosed with Parkinson's  REFERRING DIAG: G20.A1 (ICD-10-CM) - Parkinsons disease (HCC)   THERAPY DIAG:  Abnormality of gait and mobility  Unsteadiness on feet  Muscle weakness (generalized)  Repeated falls  Difficulty in walking, not elsewhere classified  Other abnormalities of gait and mobility  Other lack of coordination  Rationale for Evaluation and Treatment: Rehabilitation  SUBJECTIVE:                                                                                                                                                                                             SUBJECTIVE STATEMENT:  Pt reports he is doing "ok." Reports he had a "wee bit of soreness," from sitting in his recliner this morning. Pt disoriented to the date, thinking yesterday was Sunday. Requires therapist's cuing to recall having [facility] yesterday.   *Of note: pt requires increased time for verbal responses to therapist's questions and pt is easily distracted in busy environment  Pt accompanied by: self and his caregiver (in waiting room)  PERTINENT HISTORY: Parkinson's, deep  brain stim, depression, lipids, HTN, GERD, ED, dementia, seizures/falls, LBP/PSF, hx of lumbar fusion in 04/2022, hx of hip fx 08/2022, arthritis, frequent falls,  and OSA   PAIN:  Are you having pain? Yes: NPRS scale: 7/10 with activity below, but otherwise 3/10 at rest  Pain location:  L calvicle Pain description: ache to sharp Aggravating factors: when going to push-up to stand or control himself when sitting using armrest Relieving factors: rest and support in arm sling  PRECAUTIONS: Fall and Other:    frequent falls, most recent on 05/22/2023 with ED visit, L superolateral clavicular fx (out of sling on 06/15/23 due to pt/family discontinuing it), deep brain stimulator  RED FLAGS: Bowel or bladder incontinence: Yes: "on occasion" has incontinence    WEIGHT BEARING RESTRICTIONS: Yes unsure of specific L UE WBing restrictions, but pt currently using an AD for ambulation, while wearing basic arm sling  FALLS: Has patient fallen in last 6 months? Yes. Number of falls 10+  most recent on 05/22/2023 with injury to L shoulder  LIVING ENVIRONMENT: Lives with: lives with their spouse (wife, Radio broadcast assistant) and has hired caregivers Lives in: House/apartment Stairs: Yes: External: 2-3 steps; bilateral but cannot reach both (due to current home renovations, this is the entrance he uses) primarily uses R HR Has following equipment at home: Wheelchair (manual), shower chair, Grab bars, and Up-Walker  PLOF: Independent with household mobility with device, Requires assistive device for independence, Needs assistance with ADLs, and meal prep   Pt reports he frequently leaves his AD to the side when going to get something, often forgetting it - he requires reinforced education on consistent use of U-step walker  PATIENT GOALS: Improve Balance  OBJECTIVE:  Note: Objective measures were completed at Evaluation unless otherwise noted.  DIAGNOSTIC FINDINGS:   CT CERVICAL SPINE WITHOUT CONTRAST on  05/22/2023 IMPRESSION: Disc levels: Disc space narrowing and spurring in the lower cervical spine. Mild bilateral degenerative facet disease, left greater than right. Degenerative disc and facet disease throughout the cervical spine. No acute bony abnormality.  CT HEAD WITHOUT CONTRAST on 05/22/2023 FINDINGS: Brain: Deep brain stimulators are noted with the tips in the region of the thalami bilaterally, unchanged. Mild atrophy. No acute intracranial abnormality. Specifically, no hemorrhage, hydrocephalus, mass lesion, acute infarction, or significant intracranial injury. Vascular: No hyperdense vessel or unexpected calcification. Skull: No acute calvarial abnormality. Sinuses/Orbits: No acute findings Other: None IMPRESSION: No acute intracranial abnormality.  LEFT SHOULDER - 2+ VIEW on 05/22/2023 IMPRESSION: Acute mildly displaced fracture of the lateral aspect of the left clavicle with intra-articular extension.   COGNITION: Overall cognitive status: History of cognitive impairments - at baseline and pt with very delayed verbal responses to subjective questioning and noted to be easily distracted in the therapy gym environment   SENSATION: Light touch: Impaired  and decreased distally in B LEs, but specifically decreased throughout R LE compared to L LE   COORDINATION: Not formally assessed  EDEMA:  Not formally assessed, but no significant swelling on visual exam  MUSCLE TONE: WFL  MUSCLE LENGTH: Hamstrings: Right ____ deg; Left ___ deg  *not assessed, but would benefit from testing  DTRs:  Not formally assessed  POSTURE: rounded shoulders, forward head, decreased lumbar lordosis, and posterior pelvic tilt, posterior weight shift bias  LOWER EXTREMITY ROM:     Active  Not formally assessed Right Eval Left Eval  Hip flexion    Hip extension    Hip abduction    Hip adduction    Hip internal rotation    Hip external rotation    Knee flexion    Knee  extension    Ankle dorsiflexion Would benefit from testing Would benefit from testing  Ankle plantarflexion    Ankle inversion    Ankle eversion     (Blank  rows = not tested)  LOWER EXTREMITY MMT:    MMT Right Eval Left Eval  Hip flexion 4 4  Hip extension    Hip abduction    Hip adduction    Hip internal rotation    Hip external rotation    Knee flexion 4+ 5  Knee extension 4+ 4+  Ankle dorsiflexion 4- 4  Ankle plantarflexion 4 4-  Ankle inversion    Ankle eversion    (Blank rows = not tested)  Manual Muscle Test Scale 0/5 = No muscle contraction can be seen or felt 1/5 = Contraction can be felt, but there is no motion 2-/5 = Part moves through incomplete ROM w/ gravity decreased 2/5 = Part moves through complete ROM w/ gravity decreased 2+/5 = Part moves through incomplete ROM (<50%) against gravity or through complete ROM w/ gravity 3-/5 = Part moves through incomplete ROM (>50%) against gravity 3/5 = Part moves through complete ROM against gravity 3+/5 = Part moves through complete ROM against gravity/slight resistance 4-/5= Holds test position against slight to moderate pressure 4/5 = Part moves through complete ROM against gravity/moderate resistance 4+/5= Holds test position against moderate to strong pressure 5/5 = Part moves through complete ROM against gravity/full resistance  BED MOBILITY:  Sit to supine Min A and Mod A Supine to sit Min A and Mod A Requires adjustable bed with bedrail at home   TRANSFERS: Assistive device utilized:  Up-Walker   Sit to stand: SBA, CGA, and Min A Stand to sit: SBA, CGA, and Min A Chair to chair: CGA and Min A Floor:  Not assessed **Requires variable assist levels for transfers due to imbalance  RAMP:  Level of Assistance:    Assistive device utilized:  Ramp Comments: Not formally assessed, may benefit from testing in future  CURB:  Level of Assistance:  Assistive device utilized:  Curb Comments: Not formally  assessed, may benefit from testing in future  STAIRS: Level of Assistance:  06/12/23: Min A Stair Negotiation Technique: Forwards with Bilateral Rails  Number of Stairs: 4  Height of Stairs: 6inch  Comments: 06/12/23 - posterior LOB at top of steps when turning around, minor posterior lean throughout  GAIT: Gait pattern: step through pattern, decreased step length- Right, decreased step length- Left, decreased stride length, knee flexed in stance- Right, knee flexed in stance- Left, shuffling, festinating, trunk flexed, wide BOS, poor foot clearance- Right, and poor foot clearance- Left Distance walked: ~150ft Assistive device utilized:  Up-Walker Level of assistance: CGA and Min A Comments: Noticeable festination when turning, stepping back to sit, or side stepping  FUNCTIONAL TESTS:  5 times sit to stand: 22 seconds Timed up and go (TUG): 06/12/23: 29.40 seconds using Up-Walker with CGA/min A 10 meter walk test: 0.62m/s Berg Balance Scale: 20/56  PATIENT SURVEYS:  ABC scale 06/12/23: 30%  TREATMENT DATE:  08/10/23  Pt arrived to therapy in transport chair.   Initiated gait training ~90ft, no AD, with CGA/light min A but pt reports quick onset of R buttocks/hip pain so returned to mat table to further assess. Pt continues to demo trendelenburg gait with hip instability.  L sidelying manual therapy with soft tissue mobilization and trigger point release to R gluteal muscles, especially near ischial tuberosity as there was a larger, palpable muscle trigger point today.  Pt reports it feels like he was "kicked by a horse" and it feels like a "muscle knot."  Supine R LE single-knee-to chest stretch with L LE in hooklying position x30 sec progressed to L LE extended for  Manual PROM into hip internal rotation with significant tightness noted, figure-4 R knee  across chest and figure-4 right knee away from chest to increase hip external rotation  Gait training ~148ft, no UE support, with CGA/light min A with pt reporting possible slight improvement in pain initially, but increases with further gait distance to where overall the pain was about the same with no significant improvement following manual therapy - continues to have trendelenburg with hip drop during stance phase resulting in narrow BOS and balance instability  Dynamic stepping balance challenge of forward/backwards stepping over 1/2 foam roll without dual-task challenge - pt able to recover his balance in 2 steps consistently and progressing towards only 1 step when going backwards - consistent CGA with intermittent min A for balance but overall significant improvement compared to last session Progressed to having 4 Blaze Pods in 4 corners around the 1/2 foam roll set on sequence to have pt step forward/backwards and tap a pod (front 2 pods tapping with feet and back 2 tapping with his hands to promote posterolateral stepping and reaching)  CGA with only 3x min A for balance stability after 6 repetitions of the entire sequence! This was a significant improvement from last session as well  Dynamic balance reaching task having 4 Blaze pods around patient (in 4 corners) to promote trunk rotation and reaching, but without taking a step - set on random to create unpredictable environment - CGA with intermittent min A with pt having balance instability when rotating trunk to reach behind him - would benefit from continued progression of this intervention.   PATIENT EDUCATION: Education details: PT POC, LTGs, findings on balance assessments today Person educated: Patient and Caregiver Angelica Education method: Explanation Education comprehension: verbalized understanding and needs further education  HOME EXERCISE PROGRAM:   Access Code: [email or web address] URL: [email or web address] Date:  06/28/2023 Prepared by: Carlen Chasten  Exercises - Walking  - 1 x daily - 7 x weekly - 2 sets - 3-5 minutes hold - Sit to Stand  - 1 x daily - 7 x weekly - 2 sets - 12-15 reps - Side Stepping with Resistance at Thighs and Counter Support  - 1 x daily - 7 x weekly - 3 sets - 10 reps - Supine Bridge with Resistance Band  - 1 x daily - 7 x weekly - 2 sets - 10-15 reps - Clamshell  - 1 x daily - 7 x weekly - 3 sets - 10 reps  GOALS: Goals reviewed with patient? Yes  SHORT TERM GOALS: Target date: 07/13/2023    Patient will be independent in home exercise program to improve strength/mobility for better functional independence with ADLs. Baseline: 06/20/2023: HEP provided 06/28/2023: updated Goal status: ONGOING   2. Patient will report <2 falls over past 6  weeks to demonstrate improved safety awareness at home.   Baseline: 06/01/2023 = 10+ falls in past 6 months; 3/27: 10+ falls per patient report in past 6 weeks   Goal Status: ONGOING  LONG TERM GOALS: Target date: 08/24/2023   1.  Patient (> 18 years old) will complete five times sit to stand test in < 15 seconds indicating an increased LE strength and improved balance. Baseline: 06/01/23: 22 seconds with posterior LOB; 3/27: 11.51 seconds Goal status: MET  2.  Patient will increase Berg Balance score by > 6 points to demonstrate decreased fall risk during functional activities. Baseline: 06/01/23: 20/56; 3/27: 23/56 Goal status: ONGOING   3. Patient will reduce timed up and go to <11 seconds to reduce fall risk and demonstrate improved transfer/gait ability. Baseline: 06/12/23: 29.40 seconds using Up-Walker with CGA/min A; 3/27: 22.29 seconds with Up-walker and posterior LOB upon standing  Goal status: ONGOING  4.Patient will increase 10 meter walk test to >1.18m/s as to improve gait speed for better community ambulation and to reduce fall risk. Baseline: 06/01/23: 0.49m/s using Up-Walker; 3/27: 0.89 m/s using Up Walker Goal status:  ONGOING  5.  Patient will require no more than supervision assist with ascending/descending 3 steps using handrails per home set-up.  Baseline: 06/12/23: requires min A using HR due to posterior lean/LOB especially when turning at the top; 3/27: minA required using HR due to posterior LOB on first step   Goal status: ONGOING   ASSESSMENT:  CLINICAL IMPRESSION:    Patient arrives to treatment session motivated to participate. Pt continues to demo bilateral (R>L) hip weakness that results in balance instability during functional mobility, including gait that worsens with increased gait distance. Patient reports increased pain in R buttocks/hip region today during gait with minimal response to manual therapy and stretching today. Participated in forward/backwards dynamic stepping challenge progressed to with dual-task and pt demonstrating improved balance recoveries after stepping backwards. However, when performing static standing posterior trunk rotation and reaching pt has increased difficulty maintaining balance and would benefit from further progression of this intervention. Mr. Backlund will benefit from continued skilled physical therapy to further address his balance deficits in order to decrease his fall risk and promote increased independence with functional mobility and ADLs.   OBJECTIVE IMPAIRMENTS: Abnormal gait, decreased activity tolerance, decreased balance, decreased cognition, decreased endurance, decreased knowledge of condition, decreased knowledge of use of DME, decreased mobility, difficulty walking, decreased strength, decreased safety awareness, hypomobility, impaired flexibility, impaired sensation, improper body mechanics, postural dysfunction, and pain.   ACTIVITY LIMITATIONS: carrying, lifting, bending, standing, squatting, sleeping, stairs, transfers, bed mobility, continence, bathing, toileting, dressing, reach over head, hygiene/grooming, and locomotion level  PARTICIPATION  LIMITATIONS: meal prep, cleaning, laundry, medication management, driving, and community activity  PERSONAL FACTORS: Age, Sex, Time since onset of injury/illness/exacerbation, and 3+ comorbidities: Parkinson's, deep brain stim, depression, lipids, HTN, GERD, ED, dementia, seizures/falls, LBP/PSF, hx of lumbar fusion in 04/2022, hx of hip fx 08/2022, arthritis, frequent falls,  and OSA  are also affecting patient's functional outcome.   REHAB POTENTIAL: Good  CLINICAL DECISION MAKING: Evolving/moderate complexity  EVALUATION COMPLEXITY: Moderate  PLAN:  PT FREQUENCY: 1-2x/week  PT DURATION: 12 weeks  PLANNED INTERVENTIONS: 97164- PT Re-evaluation, 97110-Therapeutic exercises, 97530- Therapeutic activity, 97112- Neuromuscular re-education, 97535- Self Care, 56213- Manual therapy, [phone]- Gait training, [phone]- Orthotic Fit/training, [phone]- Electrical stimulation (manual), Patient/Family education, Balance training, Stair training, Taping, Joint mobilization, Joint manipulation, Spinal mobilization, Vestibular training, Cognitive remediation, DME instructions, Cryotherapy, and Moist heat  PLAN FOR NEXT SESSION:   *add dual-task challenges to interventions* Balance:   - posterior stepping strategy training with R posterolateral stepping (stepping over 1/2 foam roll with theraband resistance to external target)  - static standing balance with trunk rotation/reaching tasks (specifically posteriorly) Dynamic Gait without AD:  - backwards walking  - quick/sudden turning  - Head rotations  Strengthening:  - reinforce R hip and gluteal strengthening - bridge with resistance - clamshells! (Or supine hooklying hip abduction) - strengthening hips to create wider BOS for improved balance during gait    Nusayba Cadenas, PT, DPT, NCS, CSRS Physical Therapist - [place]  [facility]  12:50 PM 08/10/23

## 2023-08-14 ENCOUNTER — Ambulatory Visit: Payer: Medicare PPO | Admitting: Physical Therapy

## 2023-08-14 DIAGNOSIS — R269 Unspecified abnormalities of gait and mobility: Secondary | ICD-10-CM

## 2023-08-14 DIAGNOSIS — R2681 Unsteadiness on feet: Secondary | ICD-10-CM | POA: Diagnosis not present

## 2023-08-14 DIAGNOSIS — R262 Difficulty in walking, not elsewhere classified: Secondary | ICD-10-CM

## 2023-08-14 DIAGNOSIS — R278 Other lack of coordination: Secondary | ICD-10-CM | POA: Diagnosis not present

## 2023-08-14 DIAGNOSIS — G20A1 Parkinson's disease without dyskinesia, without mention of fluctuations: Secondary | ICD-10-CM | POA: Diagnosis not present

## 2023-08-14 DIAGNOSIS — R2689 Other abnormalities of gait and mobility: Secondary | ICD-10-CM | POA: Diagnosis not present

## 2023-08-14 DIAGNOSIS — R296 Repeated falls: Secondary | ICD-10-CM

## 2023-08-14 DIAGNOSIS — M6281 Muscle weakness (generalized): Secondary | ICD-10-CM | POA: Diagnosis not present

## 2023-08-14 NOTE — Therapy (Signed)
 OUTPATIENT PHYSICAL THERAPY NEURO TREATMENT   Patient Name: [name] MRN: [id number] DOB:Jan 26, 1951, 73 y.o., male Today's Date: 08/14/2023   PCP: Adelita Honer, PA-C REFERRING PROVIDER: Jacumin, Erika Leigh, PA   END OF SESSION:    PT End of Session - 08/14/23 1106     Visit Number 18    Number of Visits 24    Date for PT Re-Evaluation 08/24/23    Authorization Type [employer] [id number]    PT Start Time 1105    PT Stop Time 1148    PT Time Calculation (min) 43 min    Equipment Utilized During Treatment Gait belt    Activity Tolerance Patient tolerated treatment well;No increased pain    Behavior During Therapy WFL for tasks assessed/performed                     Past Medical History:  Diagnosis Date   Anxiety disorder    Arthritis    Bradycardia    Cancer (HCC) 2013   skin cancer   Depression    ptsd   Dysrhythmia    chronic slow heart rate   GERD (gastroesophageal reflux disease)    H/O tachycardia-bradycardia syndrome    Headache(784.0)    tension headaches non recent   History of chicken pox    History of kidney stones    passed   Hypertension    treated with HCTZ   Major depressive disorder    Mild aortic regurgitation    Pacemaker    Parkinson's disease (HCC)    dx'ed 15 years ago   PTSD (post-traumatic stress disorder)    S/P deep brain stimulator placement    Shortness of breath dyspnea    Sleep apnea    doesn't use C-pap   Unspecified dementia, moderate, with psychotic disturbance (HCC)    Varicose veins    Past Surgical History:  Procedure Laterality Date   CATARACT EXTRACTION W/PHACO Right 01/17/2023   Procedure: CATARACT EXTRACTION PHACO AND INTRAOCULAR LENS PLACEMENT (IOC) RIGHT 6.33 00:42.6;  Surgeon: Clair Crews, MD;  Location: [facility];  Service: Ophthalmology;  Laterality: Right;   CATARACT EXTRACTION W/PHACO Left 02/07/2023   Procedure: CATARACT EXTRACTION PHACO AND INTRAOCULAR LENS PLACEMENT (IOC) LEFT  6.35 00:38.3;  Surgeon: Clair Crews, MD;  Location: [facility];  Service: Ophthalmology;  Laterality: Left;   CHOLECYSTECTOMY N/A 10/22/2014   Procedure: LAPAROSCOPIC CHOLECYSTECTOMY WITH INTRAOPERATIVE CHOLANGIOGRAM;  Surgeon: Rhina Center III, MD;  Location: [facility];  Service: General;  Laterality: N/A;   cyst removed      from lip as a child   INTRAMEDULLARY (IM) NAIL INTERTROCHANTERIC N/A 02/18/2019   Procedure: INTRAMEDULLARY (IM) NAIL INTERTROCHANTRIC, RIGHT,;  Surgeon: Rande Bushy, MD;  Location: [facility];  Service: Orthopedics;  Laterality: N/A;   LUMBAR LAMINECTOMY/DECOMPRESSION MICRODISCECTOMY Bilateral 12/14/2012   Procedure: Bilateral lumbar three-four, four-five decompressive laminotomy/foraminotomy;  Surgeon: Baruch Bosch, MD;  Location: [facility];  Service: Neurosurgery;  Laterality: Bilateral;   PULSE GENERATOR IMPLANT Bilateral 12/13/2013   Procedure: Bilateral implantable pulse generator placement;  Surgeon: Manya Sells, MD;  Location: [facility];  Service: Neurosurgery;  Laterality: Bilateral;  Bilateral implantable pulse generator placement   skin cancer removed     from ears,   12 lft arm  rt leg 15   SUBTHALAMIC STIMULATOR BATTERY REPLACEMENT Bilateral 07/14/2017   Procedure: BILATERAL IMPLANTED PULSE GENERATOR CHANGE FOR DEEP BRAIN STIMULATOR;  Surgeon: Manya Sells, MD;  Location: [facility];  Service: Neurosurgery;  Laterality: Bilateral;   SUBTHALAMIC STIMULATOR INSERTION Bilateral 12/06/2013   Procedure: SUBTHALAMIC STIMULATOR INSERTION;  Surgeon: Manya Sells, MD;  Location: [facility];  Service: Neurosurgery;  Laterality: Bilateral;  Bilateral deep brain stimulator placement   Patient Active Problem List   Diagnosis Date Noted   Hematuria 10/10/2021   Sinus drainage 12/09/2020   Medicare annual wellness visit, subsequent 11/18/2020   Pacemaker    Dementia associated with Parkinson's disease (HCC) 06/17/2020   Vertigo 02/19/2020   Urine abnormality  02/19/2020   Dysphagia 02/23/2019   Fall at home 10/18/2017   REM behavioral disorder 11/02/2016   Radicular pain in right arm 10/09/2016   GERD (gastroesophageal reflux disease) 07/31/2016   Colon cancer screening 07/23/2015   Gout 02/18/2015   Depression 01/06/2015   Skin lesion 10/02/2014   Dupuytren's contracture 08/20/2014   Cough 07/21/2014   Lumbar stenosis with neurogenic claudication 06/13/2014   S/P deep brain stimulator placement 05/08/2014   Advance care planning 01/19/2014   Parkinson's disease (HCC) 12/13/2013   PTSD (post-traumatic stress disorder) 06/13/2013   Erectile dysfunction 06/13/2013   HLD (hyperlipidemia) 06/13/2013   Essential hypertension 06/03/2013   Bradycardia by electrocardiogram 06/03/2013   Obstructive sleep apnea 03/12/2013    ONSET DATE: ~15 years ago when pt was diagnosed with Parkinson's  REFERRING DIAG: G20.A1 (ICD-10-CM) - Parkinsons disease (HCC)   THERAPY DIAG:  Abnormality of gait and mobility  Unsteadiness on feet  Muscle weakness (generalized)  Repeated falls  Difficulty in walking, not elsewhere classified  Other abnormalities of gait and mobility  Other lack of coordination  Rationale for Evaluation and Treatment: Rehabilitation  SUBJECTIVE:                                                                                                                                                                                             SUBJECTIVE STATEMENT:  Pt reports he can't remember what he did this past weekend, but states he feels like he may have had a fall. Denies pain today.  [name] arrives and reports pt had 2 falls within 2 minute this past weekend - continues to fall backwards. Wife reports sometimes pt is able to catch himself, but he can't catch his posterior LOBs 100% of the time. Wife reports pt does best when he uses his Up-walker correctly, but he often grabs it incorrectly or forgets to use it all together.  Pt reports he recognizes that he forgets the Up-Walker, stating he is not intentionally leaving it behind, but forgets.   Wife discusses with therapist that she can tell a cognitive decline in the patient as well.  *  Of note: pt requires increased time for verbal responses to therapist's questions and pt is easily distracted in busy environment  Pt accompanied by: self and his caregiver (in waiting room)  PERTINENT HISTORY: Parkinson's, deep brain stim, depression, lipids, HTN, GERD, ED, dementia, seizures/falls, LBP/PSF, hx of lumbar fusion in 04/2022, hx of hip fx 08/2022, arthritis, frequent falls,  and OSA   PAIN:  Are you having pain? Yes: NPRS scale: 7/10 with activity below, but otherwise 3/10 at rest  Pain location: L calvicle Pain description: ache to sharp Aggravating factors: when going to push-up to stand or control himself when sitting using armrest Relieving factors: rest and support in arm sling  PRECAUTIONS: Fall and Other:    frequent falls, most recent on 05/22/2023 with ED visit, L superolateral clavicular fx (out of sling on 06/15/23 due to pt/family discontinuing it), deep brain stimulator  RED FLAGS: Bowel or bladder incontinence: Yes: "on occasion" has incontinence    WEIGHT BEARING RESTRICTIONS: Yes unsure of specific L UE WBing restrictions, but pt currently using an AD for ambulation, while wearing basic arm sling  FALLS: Has patient fallen in last 6 months? Yes. Number of falls 10+  most recent on 05/22/2023 with injury to L shoulder  LIVING ENVIRONMENT: Lives with: lives with their spouse (wife, Radio broadcast assistant) and has hired caregivers Lives in: House/apartment Stairs: Yes: External: 2-3 steps; bilateral but cannot reach both (due to current home renovations, this is the entrance he uses) primarily uses R HR Has following equipment at home: Wheelchair (manual), shower chair, Grab bars, and Up-Walker  PLOF: Independent with household mobility with device, Requires  assistive device for independence, Needs assistance with ADLs, and meal prep   Pt reports he frequently leaves his AD to the side when going to get something, often forgetting it - he requires reinforced education on consistent use of U-step walker  PATIENT GOALS: Improve Balance  OBJECTIVE:  Note: Objective measures were completed at Evaluation unless otherwise noted.  DIAGNOSTIC FINDINGS:   CT CERVICAL SPINE WITHOUT CONTRAST on 05/22/2023 IMPRESSION: Disc levels: Disc space narrowing and spurring in the lower cervical spine. Mild bilateral degenerative facet disease, left greater than right. Degenerative disc and facet disease throughout the cervical spine. No acute bony abnormality.  CT HEAD WITHOUT CONTRAST on 05/22/2023 FINDINGS: Brain: Deep brain stimulators are noted with the tips in the region of the thalami bilaterally, unchanged. Mild atrophy. No acute intracranial abnormality. Specifically, no hemorrhage, hydrocephalus, mass lesion, acute infarction, or significant intracranial injury. Vascular: No hyperdense vessel or unexpected calcification. Skull: No acute calvarial abnormality. Sinuses/Orbits: No acute findings Other: None IMPRESSION: No acute intracranial abnormality.  LEFT SHOULDER - 2+ VIEW on 05/22/2023 IMPRESSION: Acute mildly displaced fracture of the lateral aspect of the left clavicle with intra-articular extension.   COGNITION: Overall cognitive status: History of cognitive impairments - at baseline and pt with very delayed verbal responses to subjective questioning and noted to be easily distracted in the therapy gym environment   SENSATION: Light touch: Impaired  and decreased distally in B LEs, but specifically decreased throughout R LE compared to L LE   COORDINATION: Not formally assessed  EDEMA:  Not formally assessed, but no significant swelling on visual exam  MUSCLE TONE: WFL  MUSCLE LENGTH: Hamstrings: Right ____ deg; Left ___  deg  *not assessed, but would benefit from testing  DTRs:  Not formally assessed  POSTURE: rounded shoulders, forward head, decreased lumbar lordosis, and posterior pelvic tilt, posterior weight shift bias  LOWER  EXTREMITY ROM:     Active  Not formally assessed Right Eval Left Eval  Hip flexion    Hip extension    Hip abduction    Hip adduction    Hip internal rotation    Hip external rotation    Knee flexion    Knee extension    Ankle dorsiflexion Would benefit from testing Would benefit from testing  Ankle plantarflexion    Ankle inversion    Ankle eversion     (Blank rows = not tested)  LOWER EXTREMITY MMT:    MMT Right Eval Left Eval  Hip flexion 4 4  Hip extension    Hip abduction    Hip adduction    Hip internal rotation    Hip external rotation    Knee flexion 4+ 5  Knee extension 4+ 4+  Ankle dorsiflexion 4- 4  Ankle plantarflexion 4 4-  Ankle inversion    Ankle eversion    (Blank rows = not tested)  Manual Muscle Test Scale 0/5 = No muscle contraction can be seen or felt 1/5 = Contraction can be felt, but there is no motion 2-/5 = Part moves through incomplete ROM w/ gravity decreased 2/5 = Part moves through complete ROM w/ gravity decreased 2+/5 = Part moves through incomplete ROM (<50%) against gravity or through complete ROM w/ gravity 3-/5 = Part moves through incomplete ROM (>50%) against gravity 3/5 = Part moves through complete ROM against gravity 3+/5 = Part moves through complete ROM against gravity/slight resistance 4-/5= Holds test position against slight to moderate pressure 4/5 = Part moves through complete ROM against gravity/moderate resistance 4+/5= Holds test position against moderate to strong pressure 5/5 = Part moves through complete ROM against gravity/full resistance  BED MOBILITY:  Sit to supine Min A and Mod A Supine to sit Min A and Mod A Requires adjustable bed with bedrail at home   TRANSFERS: Assistive device  utilized:  Up-Walker   Sit to stand: SBA, CGA, and Min A Stand to sit: SBA, CGA, and Min A Chair to chair: CGA and Min A Floor:  Not assessed **Requires variable assist levels for transfers due to imbalance  RAMP:  Level of Assistance:    Assistive device utilized:  Ramp Comments: Not formally assessed, may benefit from testing in future  CURB:  Level of Assistance:  Assistive device utilized:  Curb Comments: Not formally assessed, may benefit from testing in future  STAIRS: Level of Assistance:  06/12/23: Min A Stair Negotiation Technique: Forwards with Bilateral Rails  Number of Stairs: 4  Height of Stairs: 6inch  Comments: 06/12/23 - posterior LOB at top of steps when turning around, minor posterior lean throughout  GAIT: Gait pattern: step through pattern, decreased step length- Right, decreased step length- Left, decreased stride length, knee flexed in stance- Right, knee flexed in stance- Left, shuffling, festinating, trunk flexed, wide BOS, poor foot clearance- Right, and poor foot clearance- Left Distance walked: ~173ft Assistive device utilized:  Up-Walker Level of assistance: CGA and Min A Comments: Noticeable festination when turning, stepping back to sit, or side stepping  FUNCTIONAL TESTS:  5 times sit to stand: 22 seconds Timed up and go (TUG): 06/12/23: 29.40 seconds using Up-Walker with CGA/min A 10 meter walk test: 0.82m/s Berg Balance Scale: 20/56  PATIENT SURVEYS:  ABC scale 06/12/23: 30%  TREATMENT DATE:  08/14/23  Pt arrived to therapy in transport chair.   R stand pivot transport chair>EOM with min A for balance due to getting his feet tangled and having minor R LOB.  Sit>stand from EOM with pt having mild posterior LOB, requires 3x attempts to correct form and come to standing without posterior LOB.   Gait training ~142ft, no  AD, with CGA/light min A for steadying as pt continues to have bilateral (R>L) hip instability during stance phase of gait that causes increased postural sway and increased path deviation requiring assistance for steadying balance. Continues to have excessive posterior pelvic tilt during gait.  Static standing with 6 Blaze pods around pt focused on maintaining balance during dual-task of reaching to tap pods at random (placed some slightly posterior to pt to continue targeting trunk rotation) - with only 2x posterior LOB (1x R and 1x L) requiring light min A to recover, achieves 54 hits  Progressed to adding dual-task of +2 assist holding up a deck of cards and randomly changing the cards to add additional cognitive dual-task with the Blaze Pods - only 1x posterior LOB with pt requiring several small steps and light min A to recover balance  Re-introduced 1/2 foam roll to step forward/backwards over with 4 Blaze Pods on tables around patient to promote trunk rotation to taps pods with his hands after stepping Goal of regaining static standing balance after stepping, and before reaching Requires at least 4x heavy mod A to prevent posterior LOB on 1st set, but improved on 2nd set to light min A for balance x2 reps  Improved R stand pivot EOM>transport chair at end of session with improved R lateral step requiring only CGA for safety.    PATIENT EDUCATION: Education details: PT POC, LTGs, findings on balance assessments today Person educated: Patient and Caregiver Angelica Education method: Explanation Education comprehension: verbalized understanding and needs further education  HOME EXERCISE PROGRAM:   Access Code: [email or web address] URL: [email or web address] Date: 06/28/2023 Prepared by: Carlen Chasten  Exercises - Walking  - 1 x daily - 7 x weekly - 2 sets - 3-5 minutes hold - Sit to Stand  - 1 x daily - 7 x weekly - 2 sets - 12-15 reps - Side Stepping with Resistance at  Thighs and Counter Support  - 1 x daily - 7 x weekly - 3 sets - 10 reps - Supine Bridge with Resistance Band  - 1 x daily - 7 x weekly - 2 sets - 10-15 reps - Clamshell  - 1 x daily - 7 x weekly - 3 sets - 10 reps  GOALS: Goals reviewed with patient? Yes  SHORT TERM GOALS: Target date: 07/13/2023    Patient will be independent in home exercise program to improve strength/mobility for better functional independence with ADLs. Baseline: 06/20/2023: HEP provided 06/28/2023: updated Goal status: ONGOING   2. Patient will report <2 falls over past 6 weeks to demonstrate improved safety awareness at home.   Baseline: 06/01/2023 = 10+ falls in past 6 months; 3/27: 10+ falls per patient report in past 6 weeks   Goal Status: ONGOING  LONG TERM GOALS: Target date: 08/24/2023   1.  Patient (> 73 years old) will complete five times sit to stand test in < 15 seconds indicating an increased LE strength and improved balance. Baseline: 06/01/23: 22 seconds with posterior LOB; 3/27: 11.51 seconds Goal status: MET  2.  Patient will increase Berg Balance score by > 6 points  to demonstrate decreased fall risk during functional activities. Baseline: 06/01/23: 20/56; 3/27: 23/56 Goal status: ONGOING   3. Patient will reduce timed up and go to <11 seconds to reduce fall risk and demonstrate improved transfer/gait ability. Baseline: 06/12/23: 29.40 seconds using Up-Walker with CGA/min A; 3/27: 22.29 seconds with Up-walker and posterior LOB upon standing  Goal status: ONGOING  4.Patient will increase 10 meter walk test to >1.39m/s as to improve gait speed for better community ambulation and to reduce fall risk. Baseline: 06/01/23: 0.86m/s using Up-Walker; 3/27: 0.89 m/s using Up Walker Goal status: ONGOING  5.  Patient will require no more than supervision assist with ascending/descending 3 steps using handrails per home set-up.  Baseline: 06/12/23: requires min A using HR due to posterior lean/LOB especially  when turning at the top; 3/27: minA required using HR due to posterior LOB on first step   Goal status: ONGOING   ASSESSMENT:  CLINICAL IMPRESSION:    Patient arrives to treatment session motivated to participate. Pt continues to demo bilateral (R>L) hip weakness/instability in standing that results in impaired balance during functional mobility, including gait and it worsens with increased gait distance. Therapy session focused on standing dynamic reaching progressed to dynamic forward/backwards stepping over 1/2 foam roll all with dual-task challenge targeting pt's posterior LOB tendency. Patient continues to demo posterior LOB bias, but is improving in ability to perform dual-task while attending to his balance recovery strategies with decreasing assistance. Mr. Klimczak will benefit from continued skilled physical therapy to further address his balance deficits in order to decrease his fall risk and promote increased independence with functional mobility and ADLs.   OBJECTIVE IMPAIRMENTS: Abnormal gait, decreased activity tolerance, decreased balance, decreased cognition, decreased endurance, decreased knowledge of condition, decreased knowledge of use of DME, decreased mobility, difficulty walking, decreased strength, decreased safety awareness, hypomobility, impaired flexibility, impaired sensation, improper body mechanics, postural dysfunction, and pain.   ACTIVITY LIMITATIONS: carrying, lifting, bending, standing, squatting, sleeping, stairs, transfers, bed mobility, continence, bathing, toileting, dressing, reach over head, hygiene/grooming, and locomotion level  PARTICIPATION LIMITATIONS: meal prep, cleaning, laundry, medication management, driving, and community activity  PERSONAL FACTORS: Age, Sex, Time since onset of injury/illness/exacerbation, and 3+ comorbidities: Parkinson's, deep brain stim, depression, lipids, HTN, GERD, ED, dementia, seizures/falls, LBP/PSF, hx of lumbar fusion in  04/2022, hx of hip fx 08/2022, arthritis, frequent falls,  and OSA  are also affecting patient's functional outcome.   REHAB POTENTIAL: Good  CLINICAL DECISION MAKING: Evolving/moderate complexity  EVALUATION COMPLEXITY: Moderate  PLAN:  PT FREQUENCY: 1-2x/week  PT DURATION: 12 weeks  PLANNED INTERVENTIONS: 97164- PT Re-evaluation, 97110-Therapeutic exercises, 97530- Therapeutic activity, 97112- Neuromuscular re-education, 97535- Self Care, 40981- Manual therapy, [phone]- Gait training, [phone]- Orthotic Fit/training, [phone]- Electrical stimulation (manual), Patient/Family education, Balance training, Stair training, Taping, Joint mobilization, Joint manipulation, Spinal mobilization, Vestibular training, Cognitive remediation, DME instructions, Cryotherapy, and Moist heat  PLAN FOR NEXT SESSION:   *add dual-task challenges to interventions* Balance:   - posterior stepping strategy training with R posterolateral stepping (stepping over 1/2 foam roll with theraband resistance to external target)  - static standing balance with trunk rotation/reaching tasks (specifically posteriorly) Dynamic Gait without AD:  - backwards walking  - quick/sudden turning  - Head rotations  Strengthening:  - reinforce R hip and gluteal strengthening - bridge with resistance - clamshells! (Or supine hooklying hip abduction) - strengthening hips to create wider BOS for improved balance during gait    Hagop Mccollam, PT, DPT, NCS, CSRS Physical Therapist -  [facility]  [facility]  1:54 PM 08/14/23

## 2023-08-17 ENCOUNTER — Ambulatory Visit: Payer: Medicare PPO | Attending: Physician Assistant | Admitting: Physical Therapy

## 2023-08-17 DIAGNOSIS — R278 Other lack of coordination: Secondary | ICD-10-CM | POA: Diagnosis not present

## 2023-08-17 DIAGNOSIS — R2689 Other abnormalities of gait and mobility: Secondary | ICD-10-CM | POA: Insufficient documentation

## 2023-08-17 DIAGNOSIS — R296 Repeated falls: Secondary | ICD-10-CM | POA: Diagnosis not present

## 2023-08-17 DIAGNOSIS — R269 Unspecified abnormalities of gait and mobility: Secondary | ICD-10-CM | POA: Insufficient documentation

## 2023-08-17 DIAGNOSIS — M6281 Muscle weakness (generalized): Secondary | ICD-10-CM | POA: Insufficient documentation

## 2023-08-17 DIAGNOSIS — R262 Difficulty in walking, not elsewhere classified: Secondary | ICD-10-CM | POA: Insufficient documentation

## 2023-08-17 DIAGNOSIS — G20A1 Parkinson's disease without dyskinesia, without mention of fluctuations: Secondary | ICD-10-CM | POA: Diagnosis not present

## 2023-08-17 DIAGNOSIS — R2681 Unsteadiness on feet: Secondary | ICD-10-CM | POA: Insufficient documentation

## 2023-08-17 NOTE — Therapy (Signed)
 OUTPATIENT PHYSICAL THERAPY NEURO TREATMENT   Patient Name: ORESTE CASTIGLIONI MRN: [id number] DOB:1950-12-22, 73 y.o., male Today's Date: 08/17/2023   PCP: Adelita Honer, PA-C REFERRING PROVIDER: Jacumin, Erika Leigh, PA   END OF SESSION:    PT End of Session - 08/17/23 1137     Visit Number 19    Number of Visits 24    Date for PT Re-Evaluation 08/24/23    Authorization Type [employer] [id number]    PT Start Time 1145    PT Stop Time 1230    PT Time Calculation (min) 45 min    Equipment Utilized During Treatment Gait belt    Activity Tolerance Patient tolerated treatment well;No increased pain    Behavior During Therapy WFL for tasks assessed/performed               Past Medical History:  Diagnosis Date   Anxiety disorder    Arthritis    Bradycardia    Cancer (HCC) 2013   skin cancer   Depression    ptsd   Dysrhythmia    chronic slow heart rate   GERD (gastroesophageal reflux disease)    H/O tachycardia-bradycardia syndrome    Headache(784.0)    tension headaches non recent   History of chicken pox    History of kidney stones    passed   Hypertension    treated with HCTZ   Major depressive disorder    Mild aortic regurgitation    Pacemaker    Parkinson's disease (HCC)    dx'ed 15 years ago   PTSD (post-traumatic stress disorder)    S/P deep brain stimulator placement    Shortness of breath dyspnea    Sleep apnea    doesn't use C-pap   Unspecified dementia, moderate, with psychotic disturbance (HCC)    Varicose veins    Past Surgical History:  Procedure Laterality Date   CATARACT EXTRACTION W/PHACO Right 01/17/2023   Procedure: CATARACT EXTRACTION PHACO AND INTRAOCULAR LENS PLACEMENT (IOC) RIGHT 6.33 00:42.6;  Surgeon: Clair Crews, MD;  Location: [facility];  Service: Ophthalmology;  Laterality: Right;   CATARACT EXTRACTION W/PHACO Left 02/07/2023   Procedure: CATARACT EXTRACTION PHACO AND INTRAOCULAR LENS PLACEMENT (IOC) LEFT 6.35 00:38.3;   Surgeon: Clair Crews, MD;  Location: [facility];  Service: Ophthalmology;  Laterality: Left;   CHOLECYSTECTOMY N/A 10/22/2014   Procedure: LAPAROSCOPIC CHOLECYSTECTOMY WITH INTRAOPERATIVE CHOLANGIOGRAM;  Surgeon: Rhina Center III, MD;  Location: [facility];  Service: General;  Laterality: N/A;   cyst removed      from lip as a child   INTRAMEDULLARY (IM) NAIL INTERTROCHANTERIC N/A 02/18/2019   Procedure: INTRAMEDULLARY (IM) NAIL INTERTROCHANTRIC, RIGHT,;  Surgeon: Rande Bushy, MD;  Location: [facility];  Service: Orthopedics;  Laterality: N/A;   LUMBAR LAMINECTOMY/DECOMPRESSION MICRODISCECTOMY Bilateral 12/14/2012   Procedure: Bilateral lumbar three-four, four-five decompressive laminotomy/foraminotomy;  Surgeon: Baruch Bosch, MD;  Location: [facility];  Service: Neurosurgery;  Laterality: Bilateral;   PULSE GENERATOR IMPLANT Bilateral 12/13/2013   Procedure: Bilateral implantable pulse generator placement;  Surgeon: Manya Sells, MD;  Location: [facility];  Service: Neurosurgery;  Laterality: Bilateral;  Bilateral implantable pulse generator placement   skin cancer removed     from ears,   12 lft arm  rt leg 15   SUBTHALAMIC STIMULATOR BATTERY REPLACEMENT Bilateral 07/14/2017   Procedure: BILATERAL IMPLANTED PULSE GENERATOR CHANGE FOR DEEP BRAIN STIMULATOR;  Surgeon: Manya Sells, MD;  Location: [facility];  Service: Neurosurgery;  Laterality: Bilateral;   SUBTHALAMIC STIMULATOR  INSERTION Bilateral 12/06/2013   Procedure: SUBTHALAMIC STIMULATOR INSERTION;  Surgeon: Manya Sells, MD;  Location: [facility];  Service: Neurosurgery;  Laterality: Bilateral;  Bilateral deep brain stimulator placement   Patient Active Problem List   Diagnosis Date Noted   Hematuria 10/10/2021   Sinus drainage 12/09/2020   Medicare annual wellness visit, subsequent 11/18/2020   Pacemaker    Dementia associated with Parkinson's disease (HCC) 06/17/2020   Vertigo 02/19/2020   Urine abnormality 02/19/2020    Dysphagia 02/23/2019   Fall at home 10/18/2017   REM behavioral disorder 11/02/2016   Radicular pain in right arm 10/09/2016   GERD (gastroesophageal reflux disease) 07/31/2016   Colon cancer screening 07/23/2015   Gout 02/18/2015   Depression 01/06/2015   Skin lesion 10/02/2014   Dupuytren's contracture 08/20/2014   Cough 07/21/2014   Lumbar stenosis with neurogenic claudication 06/13/2014   S/P deep brain stimulator placement 05/08/2014   Advance care planning 01/19/2014   Parkinson's disease (HCC) 12/13/2013   PTSD (post-traumatic stress disorder) 06/13/2013   Erectile dysfunction 06/13/2013   HLD (hyperlipidemia) 06/13/2013   Essential hypertension 06/03/2013   Bradycardia by electrocardiogram 06/03/2013   Obstructive sleep apnea 03/12/2013    ONSET DATE: ~15 years ago when pt was diagnosed with Parkinson's  REFERRING DIAG: G20.A1 (ICD-10-CM) - Parkinsons disease (HCC)   THERAPY DIAG:  Abnormality of gait and mobility  Unsteadiness on feet  Muscle weakness (generalized)  Other lack of coordination  Repeated falls  Difficulty in walking, not elsewhere classified  Other abnormalities of gait and mobility  Rationale for Evaluation and Treatment: Rehabilitation  SUBJECTIVE:                                                                                                                                                                                             SUBJECTIVE STATEMENT:  Pt reports he is doing okay and is ready for therapy. States he is tired today.  *Of note: pt requires increased time for verbal responses to therapist's questions and pt is easily distracted in busy environment  Pt accompanied by: self and his caregiver (in waiting room)  PERTINENT HISTORY: Parkinson's, deep brain stim, depression, lipids, HTN, GERD, ED, dementia, seizures/falls, LBP/PSF, hx of lumbar fusion in 04/2022, hx of hip fx 08/2022, arthritis, frequent falls,  and OSA    PAIN:  Are you having pain? Yes: NPRS scale: 7/10 with activity below, but otherwise 3/10 at rest  Pain location: L calvicle Pain description: ache to sharp Aggravating factors: when going to push-up to stand or control himself when sitting using armrest Relieving factors: rest and support in arm sling  PRECAUTIONS: Fall and Other:    frequent falls, most recent on 05/22/2023 with ED visit, L superolateral clavicular fx (out of sling on 06/15/23 due to pt/family discontinuing it), deep brain stimulator  RED FLAGS: Bowel or bladder incontinence: Yes: "on occasion" has incontinence    WEIGHT BEARING RESTRICTIONS: Yes unsure of specific L UE WBing restrictions, but pt currently using an AD for ambulation, while wearing basic arm sling  FALLS: Has patient fallen in last 6 months? Yes. Number of falls 10+  most recent on 05/22/2023 with injury to L shoulder  LIVING ENVIRONMENT: Lives with: lives with their spouse (wife, Radio broadcast assistant) and has hired caregivers Lives in: House/apartment Stairs: Yes: External: 2-3 steps; bilateral but cannot reach both (due to current home renovations, this is the entrance he uses) primarily uses R HR Has following equipment at home: Wheelchair (manual), shower chair, Grab bars, and Up-Walker  PLOF: Independent with household mobility with device, Requires assistive device for independence, Needs assistance with ADLs, and meal prep   Pt reports he frequently leaves his AD to the side when going to get something, often forgetting it - he requires reinforced education on consistent use of U-step walker  PATIENT GOALS: Improve Balance  OBJECTIVE:  Note: Objective measures were completed at Evaluation unless otherwise noted.  DIAGNOSTIC FINDINGS:   CT CERVICAL SPINE WITHOUT CONTRAST on 05/22/2023 IMPRESSION: Disc levels: Disc space narrowing and spurring in the lower cervical spine. Mild bilateral degenerative facet disease, left greater than right. Degenerative  disc and facet disease throughout the cervical spine. No acute bony abnormality.  CT HEAD WITHOUT CONTRAST on 05/22/2023 FINDINGS: Brain: Deep brain stimulators are noted with the tips in the region of the thalami bilaterally, unchanged. Mild atrophy. No acute intracranial abnormality. Specifically, no hemorrhage, hydrocephalus, mass lesion, acute infarction, or significant intracranial injury. Vascular: No hyperdense vessel or unexpected calcification. Skull: No acute calvarial abnormality. Sinuses/Orbits: No acute findings Other: None IMPRESSION: No acute intracranial abnormality.  LEFT SHOULDER - 2+ VIEW on 05/22/2023 IMPRESSION: Acute mildly displaced fracture of the lateral aspect of the left clavicle with intra-articular extension.   COGNITION: Overall cognitive status: History of cognitive impairments - at baseline and pt with very delayed verbal responses to subjective questioning and noted to be easily distracted in the therapy gym environment   SENSATION: Light touch: Impaired  and decreased distally in B LEs, but specifically decreased throughout R LE compared to L LE   COORDINATION: Not formally assessed  EDEMA:  Not formally assessed, but no significant swelling on visual exam  MUSCLE TONE: WFL  MUSCLE LENGTH: Hamstrings: Right ____ deg; Left ___ deg  *not assessed, but would benefit from testing  DTRs:  Not formally assessed  POSTURE: rounded shoulders, forward head, decreased lumbar lordosis, and posterior pelvic tilt, posterior weight shift bias  LOWER EXTREMITY ROM:     Active  Not formally assessed Right Eval Left Eval  Hip flexion    Hip extension    Hip abduction    Hip adduction    Hip internal rotation    Hip external rotation    Knee flexion    Knee extension    Ankle dorsiflexion Would benefit from testing Would benefit from testing  Ankle plantarflexion    Ankle inversion    Ankle eversion     (Blank rows = not tested)  LOWER  EXTREMITY MMT:    MMT Right Eval Left Eval  Hip flexion 4 4  Hip extension    Hip abduction  Hip adduction    Hip internal rotation    Hip external rotation    Knee flexion 4+ 5  Knee extension 4+ 4+  Ankle dorsiflexion 4- 4  Ankle plantarflexion 4 4-  Ankle inversion    Ankle eversion    (Blank rows = not tested)  Manual Muscle Test Scale 0/5 = No muscle contraction can be seen or felt 1/5 = Contraction can be felt, but there is no motion 2-/5 = Part moves through incomplete ROM w/ gravity decreased 2/5 = Part moves through complete ROM w/ gravity decreased 2+/5 = Part moves through incomplete ROM (<50%) against gravity or through complete ROM w/ gravity 3-/5 = Part moves through incomplete ROM (>50%) against gravity 3/5 = Part moves through complete ROM against gravity 3+/5 = Part moves through complete ROM against gravity/slight resistance 4-/5= Holds test position against slight to moderate pressure 4/5 = Part moves through complete ROM against gravity/moderate resistance 4+/5= Holds test position against moderate to strong pressure 5/5 = Part moves through complete ROM against gravity/full resistance  BED MOBILITY:  Sit to supine Min A and Mod A Supine to sit Min A and Mod A Requires adjustable bed with bedrail at home   TRANSFERS: Assistive device utilized:  Up-Walker   Sit to stand: SBA, CGA, and Min A Stand to sit: SBA, CGA, and Min A Chair to chair: CGA and Min A Floor:  Not assessed **Requires variable assist levels for transfers due to imbalance  RAMP:  Level of Assistance:    Assistive device utilized:  Ramp Comments: Not formally assessed, may benefit from testing in future  CURB:  Level of Assistance:  Assistive device utilized:  Curb Comments: Not formally assessed, may benefit from testing in future  STAIRS: Level of Assistance:  06/12/23: Min A Stair Negotiation Technique: Forwards with Bilateral Rails  Number of Stairs: 4  Height of  Stairs: 6inch  Comments: 06/12/23 - posterior LOB at top of steps when turning around, minor posterior lean throughout  GAIT: Gait pattern: step through pattern, decreased step length- Right, decreased step length- Left, decreased stride length, knee flexed in stance- Right, knee flexed in stance- Left, shuffling, festinating, trunk flexed, wide BOS, poor foot clearance- Right, and poor foot clearance- Left Distance walked: ~163ft Assistive device utilized:  Up-Walker Level of assistance: CGA and Min A Comments: Noticeable festination when turning, stepping back to sit, or side stepping  FUNCTIONAL TESTS:  5 times sit to stand: 22 seconds Timed up and go (TUG): 06/12/23: 29.40 seconds using Up-Walker with CGA/min A 10 meter walk test: 0.60m/s Berg Balance Scale: 20/56  PATIENT SURVEYS:  ABC scale 06/12/23: 30%                                                                                                                              TREATMENT DATE:  08/17/23  Pt arrived to therapy in transport chair.   R stand pivot transport chair>EOM with  min A for balance due to having minor R posterior LOB.  B LE functional strengthening to improve transfers and gait including:  Supine bridge with GTB resistance around knees for hip abductor activation x15reps Pt reports feeling this more in his back today, deferred 2nd set Sidelying clamshells x15 reps with x10reps with manual resistance per side Pt reports feeling this in his spine when performing with R LE, pt doesn't report this as challenging but has difficulty consistently recruiting the correct muscle with adequate force Attempted supine hooklying anterior pelvic tilts, but unable to find a cue where patient achieved goal movement so discontinued Goal to move pt out of posterior pelvic tilt because that is his positioning in standing Lateral side stepping ~8-10 steps each direction, down/back x3 reps  Requires heavy mod A for balance,  especially towards end with fatigue with worsening posterior LOB More difficulty stepping towards L due to instability standing on R hip  Standing with B UE support on balance bar R hip abduction against RTB resistance 2x 10reps  Performed standing on RLE, L LE hip abduction without resistance to focus on R hip stability in wt bearing position x10 reps with cuing to prevent hip drop  Seated trunk flexion ball roll outs x10 reps targeting increased anterior pelvic tilt to improve pelvis alignment - denies pain with this  Sit>stand from EOM with large red theraball to retrain forward trunk flexion prior to initiating coming to stand to decrease posterior LOB - x6 reps then paused to re-teach the movement and performed additional x6 reps, then removed theraball for carryover of improved motor plan without it x4 reps Today pt's L LE is excessively abducted   Gait training ~310ft, no AD, with light min A - continues to have narrow BOS with hip instability, but not as significant today allowing pt to go increased distance without increased assistance - this was an endurance challenge for patient with labored breathing noted afterwards  Repeated R LE step-ups onto green step with 1x purple plate 2x [id number] focusing on R hip strengthening with therapist providing tactile cuing for increased glute activation Requires light min A for balance with goal of allowing pt to utilize stepping strategies to maintain upright without increased assist - requires several posterior steps to prevent posterior LOB     PATIENT EDUCATION: Education details: PT POC, LTGs, findings on balance assessments today Person educated: Patient and Caregiver Angelica Education method: Explanation Education comprehension: verbalized understanding and needs further education  HOME EXERCISE PROGRAM:   Access Code: [email or web address] URL: [email or web address] Date: 06/28/2023 Prepared by: Carlen Chasten  Exercises - Walking   - 1 x daily - 7 x weekly - 2 sets - 3-5 minutes hold - Sit to Stand  - 1 x daily - 7 x weekly - 2 sets - 12-15 reps - Side Stepping with Resistance at Thighs and Counter Support  - 1 x daily - 7 x weekly - 3 sets - 10 reps - Supine Bridge with Resistance Band  - 1 x daily - 7 x weekly - 2 sets - 10-15 reps - Clamshell  - 1 x daily - 7 x weekly - 3 sets - 10 reps  GOALS: Goals reviewed with patient? Yes  SHORT TERM GOALS: Target date: 07/13/2023    Patient will be independent in home exercise program to improve strength/mobility for better functional independence with ADLs. Baseline: 06/20/2023: HEP provided 06/28/2023: updated Goal status: ONGOING   2. Patient will report <2 falls over past 6 weeks  to demonstrate improved safety awareness at home.   Baseline: 06/01/2023 = 10+ falls in past 6 months; 3/27: 10+ falls per patient report in past 6 weeks   Goal Status: ONGOING  LONG TERM GOALS: Target date: 08/24/2023   1.  Patient (> 51 years old) will complete five times sit to stand test in < 15 seconds indicating an increased LE strength and improved balance. Baseline: 06/01/23: 22 seconds with posterior LOB; 3/27: 11.51 seconds Goal status: MET  2.  Patient will increase Berg Balance score by > 6 points to demonstrate decreased fall risk during functional activities. Baseline: 06/01/23: 20/56; 3/27: 23/56 Goal status: ONGOING   3. Patient will reduce timed up and go to <11 seconds to reduce fall risk and demonstrate improved transfer/gait ability. Baseline: 06/12/23: 29.40 seconds using Up-Walker with CGA/min A; 3/27: 22.29 seconds with Up-walker and posterior LOB upon standing  Goal status: ONGOING  4.Patient will increase 10 meter walk test to >1.57m/s as to improve gait speed for better community ambulation and to reduce fall risk. Baseline: 06/01/23: 0.46m/s using Up-Walker; 3/27: 0.89 m/s using Up Walker Goal status: ONGOING  5.  Patient will require no more than supervision assist  with ascending/descending 3 steps using handrails per home set-up.  Baseline: 06/12/23: requires min A using HR due to posterior lean/LOB especially when turning at the top; 3/27: minA required using HR due to posterior LOB on first step   Goal status: ONGOING   ASSESSMENT:  CLINICAL IMPRESSION:    Patient arrives to treatment session motivated to participate. Pt continues to demo bilateral (R>L) hip weakness/instability in standing that results in impaired balance during functional mobility, including gait, that worsens with increased gait distance. Therapy session focused on functional hip/glute strengthening to improve balance when standing and during transfers. Pt experiencing some low back pain/discomfort with mat level exercises today so transitioned to standing exercises and pt tolerated them well. Pt participated in 369ft gait training today, no AD, with improved gait endurance without worsening of hip instability, requiring only light min A. Mr. Belisle will benefit from continued skilled physical therapy to further address his balance deficits in order to decrease his fall risk and promote increased independence with functional mobility and ADLs.   OBJECTIVE IMPAIRMENTS: Abnormal gait, decreased activity tolerance, decreased balance, decreased cognition, decreased endurance, decreased knowledge of condition, decreased knowledge of use of DME, decreased mobility, difficulty walking, decreased strength, decreased safety awareness, hypomobility, impaired flexibility, impaired sensation, improper body mechanics, postural dysfunction, and pain.   ACTIVITY LIMITATIONS: carrying, lifting, bending, standing, squatting, sleeping, stairs, transfers, bed mobility, continence, bathing, toileting, dressing, reach over head, hygiene/grooming, and locomotion level  PARTICIPATION LIMITATIONS: meal prep, cleaning, laundry, medication management, driving, and community activity  PERSONAL FACTORS: Age, Sex,  Time since onset of injury/illness/exacerbation, and 3+ comorbidities: Parkinson's, deep brain stim, depression, lipids, HTN, GERD, ED, dementia, seizures/falls, LBP/PSF, hx of lumbar fusion in 04/2022, hx of hip fx 08/2022, arthritis, frequent falls,  and OSA  are also affecting patient's functional outcome.   REHAB POTENTIAL: Good  CLINICAL DECISION MAKING: Evolving/moderate complexity  EVALUATION COMPLEXITY: Moderate  PLAN:  PT FREQUENCY: 1-2x/week  PT DURATION: 12 weeks  PLANNED INTERVENTIONS: 97164- PT Re-evaluation, 97110-Therapeutic exercises, 97530- Therapeutic activity, 97112- Neuromuscular re-education, 97535- Self Care, 16109- Manual therapy, [phone]- Gait training, [phone]- Orthotic Fit/training, [phone]- Electrical stimulation (manual), Patient/Family education, Balance training, Stair training, Taping, Joint mobilization, Joint manipulation, Spinal mobilization, Vestibular training, Cognitive remediation, DME instructions, Cryotherapy, and Moist heat  PLAN FOR NEXT  SESSION:   *add dual-task challenges to interventions* Balance:   - posterior stepping strategy training with R posterolateral stepping (stepping over 1/2 foam roll with theraband resistance to external target)  - static standing balance with trunk rotation/reaching tasks (specifically posteriorly) Dynamic Gait without AD:  - backwards walking  - quick/sudden turning  - Head rotations  Strengthening:  - reinforce R hip and gluteal strengthening - bridge with resistance - clamshells! (Or supine hooklying hip abduction) - strengthening hips to create wider BOS for improved balance during gait Stretching into anterior pelvic tilt    Tahja Liao, PT, DPT, NCS, CSRS Physical Therapist - [place]  [facility]  12:41 PM 08/17/23

## 2023-08-21 ENCOUNTER — Ambulatory Visit: Payer: Medicare PPO | Admitting: Physical Therapy

## 2023-08-21 ENCOUNTER — Ambulatory Visit: Payer: Self-pay

## 2023-08-21 DIAGNOSIS — R2681 Unsteadiness on feet: Secondary | ICD-10-CM

## 2023-08-21 DIAGNOSIS — G20A1 Parkinson's disease without dyskinesia, without mention of fluctuations: Secondary | ICD-10-CM | POA: Diagnosis not present

## 2023-08-21 DIAGNOSIS — R296 Repeated falls: Secondary | ICD-10-CM

## 2023-08-21 DIAGNOSIS — R278 Other lack of coordination: Secondary | ICD-10-CM | POA: Diagnosis not present

## 2023-08-21 DIAGNOSIS — M6281 Muscle weakness (generalized): Secondary | ICD-10-CM

## 2023-08-21 DIAGNOSIS — R262 Difficulty in walking, not elsewhere classified: Secondary | ICD-10-CM | POA: Diagnosis not present

## 2023-08-21 DIAGNOSIS — R269 Unspecified abnormalities of gait and mobility: Secondary | ICD-10-CM | POA: Diagnosis not present

## 2023-08-21 DIAGNOSIS — R2689 Other abnormalities of gait and mobility: Secondary | ICD-10-CM | POA: Diagnosis not present

## 2023-08-21 NOTE — Therapy (Signed)
 OUTPATIENT PHYSICAL THERAPY NEURO TREATMENT/RECERT   Patient Name: [name] MRN: [id number] DOB:02/09/1951, 73 y.o., male Today's Date: 08/24/2023   PCP: Adelita Honer, PA-C REFERRING PROVIDER: Jacumin, Erika Leigh, PA   END OF SESSION:    PT End of Session - 08/24/23 1149     Visit Number 21    Number of Visits 24    Date for PT Re-Evaluation 11/16/23    Authorization Type [employer] [id number]    PT Start Time 1149    PT Stop Time 1230    PT Time Calculation (min) 41 min    Equipment Utilized During Treatment Gait belt    Activity Tolerance Patient tolerated treatment well    Behavior During Therapy WFL for tasks assessed/performed               Past Medical History:  Diagnosis Date   Anxiety disorder    Arthritis    Bradycardia    Cancer (HCC) 2013   skin cancer   Depression    ptsd   Dysrhythmia    chronic slow heart rate   GERD (gastroesophageal reflux disease)    H/O tachycardia-bradycardia syndrome    Headache(784.0)    tension headaches non recent   History of chicken pox    History of kidney stones    passed   Hypertension    treated with HCTZ   Major depressive disorder    Mild aortic regurgitation    Pacemaker    Parkinson's disease (HCC)    dx'ed 15 years ago   PTSD (post-traumatic stress disorder)    S/P deep brain stimulator placement    Shortness of breath dyspnea    Sleep apnea    doesn't use C-pap   Unspecified dementia, moderate, with psychotic disturbance (HCC)    Varicose veins    Past Surgical History:  Procedure Laterality Date   CATARACT EXTRACTION W/PHACO Right 01/17/2023   Procedure: CATARACT EXTRACTION PHACO AND INTRAOCULAR LENS PLACEMENT (IOC) RIGHT 6.33 00:42.6;  Surgeon: Clair Crews, MD;  Location: [facility];  Service: Ophthalmology;  Laterality: Right;   CATARACT EXTRACTION W/PHACO Left 02/07/2023   Procedure: CATARACT EXTRACTION PHACO AND INTRAOCULAR LENS PLACEMENT (IOC) LEFT 6.35 00:38.3;  Surgeon:  Clair Crews, MD;  Location: [facility];  Service: Ophthalmology;  Laterality: Left;   CHOLECYSTECTOMY N/A 10/22/2014   Procedure: LAPAROSCOPIC CHOLECYSTECTOMY WITH INTRAOPERATIVE CHOLANGIOGRAM;  Surgeon: Rhina Center III, MD;  Location: [facility];  Service: General;  Laterality: N/A;   cyst removed      from lip as a child   INTRAMEDULLARY (IM) NAIL INTERTROCHANTERIC N/A 02/18/2019   Procedure: INTRAMEDULLARY (IM) NAIL INTERTROCHANTRIC, RIGHT,;  Surgeon: Rande Bushy, MD;  Location: [facility];  Service: Orthopedics;  Laterality: N/A;   LUMBAR LAMINECTOMY/DECOMPRESSION MICRODISCECTOMY Bilateral 12/14/2012   Procedure: Bilateral lumbar three-four, four-five decompressive laminotomy/foraminotomy;  Surgeon: Baruch Bosch, MD;  Location: [facility];  Service: Neurosurgery;  Laterality: Bilateral;   PULSE GENERATOR IMPLANT Bilateral 12/13/2013   Procedure: Bilateral implantable pulse generator placement;  Surgeon: Manya Sells, MD;  Location: [facility];  Service: Neurosurgery;  Laterality: Bilateral;  Bilateral implantable pulse generator placement   skin cancer removed     from ears,   12 lft arm  rt leg 15   SUBTHALAMIC STIMULATOR BATTERY REPLACEMENT Bilateral 07/14/2017   Procedure: BILATERAL IMPLANTED PULSE GENERATOR CHANGE FOR DEEP BRAIN STIMULATOR;  Surgeon: Manya Sells, MD;  Location: [facility];  Service: Neurosurgery;  Laterality: Bilateral;   SUBTHALAMIC STIMULATOR INSERTION Bilateral  12/06/2013   Procedure: SUBTHALAMIC STIMULATOR INSERTION;  Surgeon: Manya Sells, MD;  Location: [facility];  Service: Neurosurgery;  Laterality: Bilateral;  Bilateral deep brain stimulator placement   Patient Active Problem List   Diagnosis Date Noted   Hematuria 10/10/2021   Sinus drainage 12/09/2020   Medicare annual wellness visit, subsequent 11/18/2020   Pacemaker    Dementia associated with Parkinson's disease (HCC) 06/17/2020   Vertigo 02/19/2020   Urine abnormality 02/19/2020   Dysphagia  02/23/2019   Fall at home 10/18/2017   REM behavioral disorder 11/02/2016   Radicular pain in right arm 10/09/2016   GERD (gastroesophageal reflux disease) 07/31/2016   Colon cancer screening 07/23/2015   Gout 02/18/2015   Depression 01/06/2015   Skin lesion 10/02/2014   Dupuytren's contracture 08/20/2014   Cough 07/21/2014   Lumbar stenosis with neurogenic claudication 06/13/2014   S/P deep brain stimulator placement 05/08/2014   Advance care planning 01/19/2014   Parkinson's disease (HCC) 12/13/2013   PTSD (post-traumatic stress disorder) 06/13/2013   Erectile dysfunction 06/13/2013   HLD (hyperlipidemia) 06/13/2013   Essential hypertension 06/03/2013   Bradycardia by electrocardiogram 06/03/2013   Obstructive sleep apnea 03/12/2013    ONSET DATE: ~15 years ago when pt was diagnosed with Parkinson's  REFERRING DIAG: G20.A1 (ICD-10-CM) - Parkinsons disease (HCC)   THERAPY DIAG:  Abnormality of gait and mobility  Unsteadiness on feet  Difficulty in walking, not elsewhere classified  Muscle weakness (generalized)  Other lack of coordination  Repeated falls  Other abnormalities of gait and mobility  Rationale for Evaluation and Treatment: Rehabilitation  SUBJECTIVE:                                                                                                                                                                                             SUBJECTIVE STATEMENT:  Pt states this morning he couldn't walk ~48ft when he got out of the bed, even though he was using his Up-Walker. Pt reports he was feeling like "crap" and was feeling "weak." States he can't remember if he was feeling dizzy. Pt states he is still not feeling well because he is worried about where his new cell phone is located (per caregiver it is in the car).  Reports L shoulder pain and back pain today. No reports of falls since last session.  *Of note: pt requires increased time for verbal  responses to therapist's questions and pt is easily distracted in busy environment  Pt accompanied by: self and his caregiver (in waiting room)  PERTINENT HISTORY: Parkinson's, deep brain stim, depression, lipids, HTN, GERD, ED, dementia, seizures/falls, LBP/PSF, hx of lumbar  fusion in 04/2022, hx of hip fx 08/2022, arthritis, frequent falls,  and OSA   PAIN:  Are you having pain? Yes: NPRS scale: 7/10 with activity below, but otherwise 3/10 at rest  Pain location: L calvicle Pain description: ache to sharp Aggravating factors: when going to push-up to stand or control himself when sitting using armrest Relieving factors: rest and support in arm sling  PRECAUTIONS: Fall and Other:   frequent falls, most recent on 05/22/2023 with ED visit, L superolateral clavicular fx (out of sling on 06/15/23 due to pt/family discontinuing it), deep brain stimulator  RED FLAGS: Bowel or bladder incontinence: Yes: "on occasion" has incontinence   WEIGHT BEARING RESTRICTIONS: Yes unsure of specific L UE WBing restrictions, but pt currently using an AD for ambulation, while wearing basic arm sling  FALLS: Has patient fallen in last 6 months? Yes. Number of falls 10+ most recent on 05/22/2023 with injury to L shoulder  LIVING ENVIRONMENT: Lives with: lives with their spouse (wife, Radio broadcast assistant) and has hired caregivers Lives in: House/apartment Stairs: Yes: External: 2-3 steps; bilateral but cannot reach both (due to current home renovations, this is the entrance he uses) primarily uses R HR Has following equipment at home: Wheelchair (manual), shower chair, Grab bars, and Up-Walker  PLOF: Independent with household mobility with device, Requires assistive device for independence, Needs assistance with ADLs, and meal prep  Pt reports he frequently leaves his AD to the side when going to get something, often forgetting it - he requires reinforced education on consistent use of U-step walker  PATIENT GOALS: Improve  Balance  OBJECTIVE:  Note: Objective measures were completed at Evaluation unless otherwise noted.  DIAGNOSTIC FINDINGS:   CT CERVICAL SPINE WITHOUT CONTRAST on 05/22/2023 IMPRESSION: Disc levels: Disc space narrowing and spurring in the lower cervical spine. Mild bilateral degenerative facet disease, left greater than right. Degenerative disc and facet disease throughout the cervical spine. No acute bony abnormality.  CT HEAD WITHOUT CONTRAST on 05/22/2023 FINDINGS: Brain: Deep brain stimulators are noted with the tips in the region of the thalami bilaterally, unchanged. Mild atrophy. No acute intracranial abnormality. Specifically, no hemorrhage, hydrocephalus, mass lesion, acute infarction, or significant intracranial injury. Vascular: No hyperdense vessel or unexpected calcification. Skull: No acute calvarial abnormality. Sinuses/Orbits: No acute findings Other: None IMPRESSION: No acute intracranial abnormality.  LEFT SHOULDER - 2+ VIEW on 05/22/2023 IMPRESSION: Acute mildly displaced fracture of the lateral aspect of the left clavicle with intra-articular extension.   COGNITION: Overall cognitive status: History of cognitive impairments - at baseline and pt with very delayed verbal responses to subjective questioning and noted to be easily distracted in the therapy gym environment   SENSATION: Light touch: Impaired  and decreased distally in B LEs, but specifically decreased throughout R LE compared to L LE   COORDINATION: Not formally assessed  EDEMA:  Not formally assessed, but no significant swelling on visual exam  MUSCLE TONE: WFL  MUSCLE LENGTH: Hamstrings: Right ____ deg; Left ___ deg  *not assessed, but would benefit from testing  DTRs:  Not formally assessed  POSTURE: rounded shoulders, forward head, decreased lumbar lordosis, and posterior pelvic tilt, posterior weight shift bias  LOWER EXTREMITY ROM:     Active  Not formally assessed  Right Eval Left Eval  Hip flexion    Hip extension    Hip abduction    Hip adduction    Hip internal rotation    Hip external rotation    Knee flexion  Knee extension    Ankle dorsiflexion Would benefit from testing Would benefit from testing  Ankle plantarflexion    Ankle inversion    Ankle eversion     (Blank rows = not tested)  LOWER EXTREMITY MMT:    MMT Right Eval Left Eval  Hip flexion 4 4  Hip extension    Hip abduction    Hip adduction    Hip internal rotation    Hip external rotation    Knee flexion 4+ 5  Knee extension 4+ 4+  Ankle dorsiflexion 4- 4  Ankle plantarflexion 4 4-  Ankle inversion    Ankle eversion    (Blank rows = not tested)  Manual Muscle Test Scale 0/5 = No muscle contraction can be seen or felt 1/5 = Contraction can be felt, but there is no motion 2-/5 = Part moves through incomplete ROM w/ gravity decreased 2/5 = Part moves through complete ROM w/ gravity decreased 2+/5 = Part moves through incomplete ROM (<50%) against gravity or through complete ROM w/ gravity 3-/5 = Part moves through incomplete ROM (>50%) against gravity 3/5 = Part moves through complete ROM against gravity 3+/5 = Part moves through complete ROM against gravity/slight resistance 4-/5= Holds test position against slight to moderate pressure 4/5 = Part moves through complete ROM against gravity/moderate resistance 4+/5= Holds test position against moderate to strong pressure 5/5 = Part moves through complete ROM against gravity/full resistance  BED MOBILITY:  Sit to supine Min A and Mod A Supine to sit Min A and Mod A Requires adjustable bed with bedrail at home   TRANSFERS: Assistive device utilized: Up-Walker  Sit to stand: SBA, CGA, and Min A Stand to sit: SBA, CGA, and Min A Chair to chair: CGA and Min A Floor: Not assessed **Requires variable assist levels for transfers due to imbalance  RAMP:  Level of Assistance:   Assistive device utilized:   Ramp Comments: Not formally assessed, may benefit from testing in future  CURB:  Level of Assistance:  Assistive device utilized:  Curb Comments: Not formally assessed, may benefit from testing in future  STAIRS: Level of Assistance: 06/12/23: Min A Stair Negotiation Technique: Forwards with Bilateral Rails  Number of Stairs: 4  Height of Stairs: 6inch  Comments: 06/12/23 - posterior LOB at top of steps when turning around, minor posterior lean throughout  GAIT: Gait pattern: step through pattern, decreased step length- Right, decreased step length- Left, decreased stride length, knee flexed in stance- Right, knee flexed in stance- Left, shuffling, festinating, trunk flexed, wide BOS, poor foot clearance- Right, and poor foot clearance- Left Distance walked: ~133ft Assistive device utilized: Up-Walker Level of assistance: CGA and Min A Comments: Noticeable festination when turning, stepping back to sit, or side stepping  FUNCTIONAL TESTS:  5 times sit to stand: 22 seconds Timed up and go (TUG): 06/12/23: 29.40 seconds using Up-Walker with CGA/min A 10 meter walk test: 0.37m/s Berg Balance Scale: 20/56  PATIENT SURVEYS:  ABC scale 06/12/23: 30%  TREATMENT DATE:  08/24/23  Pt arrived to therapy in transport chair.   Gait training ~130ft using clinic's Up-Walker with CGA and pt reporting onset of R buttocks pain during gait - pt continues to demo narrow BOS with hip instability and excessive posterior pelvic tilt while walking - attempted to provide cuing to aim his feet towards either side of AD to improve BOS, but no noticeable change in gait mechanics.   L sidelying manual therapy via instrument assisted soft tissue mobilization using therastick x5 minutes to R gluteal region and including hamstrings  L sidelying clamshells using GTB resistance 2x  10reps Supine bridge with GTB around knees for increased hip abductor activation x10 reps   - requires max cuing for R hip abduction activaiton throughout the movement with impaired ability to motor plan this dual-task  - Reports back is "tolerable" during bridges today   Repeated sit<>stands from EOM with RTB resistance around knees and therapist on his R side to be target to abduct R knee to - 2x 10 reps - continues to have posterior LOB once coming to stand requiring cuing to remember to flex trunk forward during the transition in BOTH directions. Transitioned to repeated sit<>stands with a pause while standing-up to perform dual-task of tapping targets called out by therapist prior to returning to sit to challenge immediate standing balance - pt able to maintain balance during this, without posterior LOB - close SBA for safety  Gait training ~148ft, no AD, with skilled min/light mod A for balance during dual-task challenge of visually scanning environment to identify targets on wall - head turning results in patient having significant lateral LOB in both directions and even having to perform stepping strategy via crossing his feet over, in order to maintain upright.   Pt reports he feels like he can walk further now before having onset of R buttocks pain.    PATIENT EDUCATION: Education details: PT POC, LTGs, findings on balance assessments today Person educated: Patient and Caregiver Angelica Education method: Explanation Education comprehension: verbalized understanding and needs further education  HOME EXERCISE PROGRAM:   Access Code: [email or web address] URL: [email or web address] Date: 06/28/2023 Prepared by: Carlen Chasten  Exercises - Walking  - 1 x daily - 7 x weekly - 2 sets - 3-5 minutes hold - Sit to Stand  - 1 x daily - 7 x weekly - 2 sets - 12-15 reps - Side Stepping with Resistance at Thighs and Counter Support  - 1 x daily - 7 x weekly - 3 sets - 10 reps - Supine  Bridge with Resistance Band  - 1 x daily - 7 x weekly - 2 sets - 10-15 reps - Clamshell  - 1 x daily - 7 x weekly - 3 sets - 10 reps  GOALS: Goals reviewed with patient? Yes  SHORT TERM GOALS: Target date: 10/05/2023    Patient will be independent in home exercise program to improve strength/mobility for better functional independence with ADLs. Baseline: 06/20/2023: HEP provided 06/28/2023: updated 08/21/2023: pt inconsistent with compliance  Goal status: ONGOING   2. Patient will report <2 falls over past 6 weeks to demonstrate improved safety awareness at home.  Baseline: 06/01/2023 = 10+ falls in past 6 months; 3/27: 10+ falls per patient report in past 6 weeks; 08/21/2023: at least 5+ falls in past 6 weeks Goal Status: ONGOING  LONG TERM GOALS: Target date: 11/16/2023    1.  Patient (> 24 years old) will complete five times sit to stand test in <  15 seconds indicating an increased LE strength and improved balance. Baseline: 06/01/23: 22 seconds with posterior LOB; 3/27: 11.51 seconds; 5/5: 12.17 seconds  Goal status: MET  2.  Patient will increase Berg Balance score by > 6 points to demonstrate decreased fall risk during functional activities. Baseline: 06/01/23: 20/56; 3/27: 23/56; 5/5: 34/56  Goal status: ONGOING   3. Patient will reduce timed up and go to <11 seconds to reduce fall risk and demonstrate improved transfer/gait ability. Baseline: 06/12/23: 29.40 seconds using Up-Walker with CGA/min A; 3/27: 22.29 seconds with Up-walker and posterior LOB upon standing; 5/5: 16.13 seconds using Up-Walker with close SBA for safety Goal status: ONGOING  4.Patient will increase 10 meter walk test to >1.52m/s as to improve gait speed for better community ambulation and to reduce fall risk. Baseline: 06/01/23: 0.52m/s using Up-Walker; 3/27: 0.89 m/s using Up Walker, 5/5: 0.928 m/s using Up-Walker (after warm-up) Goal status: ONGOING  5.  Patient will require no more than supervision assist  with ascending/descending 3 steps using handrails per home set-up.  Baseline: 06/12/23: requires min A using HR due to posterior lean/LOB especially when turning at the top; 3/27: minA required using HR due to posterior LOB on first step; 5/5: supervision using bilateral HRs and self-selecting reciprocal pattern (no LOB)  Goal status: ONGOING   ASSESSMENT:  CLINICAL IMPRESSION:    Patient arrives to treatment session motivated to participate. Plan to continue with therapy at this time as laid out in updated plan of care based on patient continuing to demonstrate improvement on all assessments as was described at last visit's progress note. Therapy session focused on addressing R buttocks pain with pt reporting he now feels he is able to ambulate a greater distance prior to onset of the pain. Therapy session also focused on improved sit<>stands, specifically to decrease posterior LOB when rising to stand. Patient continues to demonstrate impaired dynamic balance as was noted by lateral LOB during gait training with head turning. Mr. Ice will benefit from continued skilled physical therapy to further address his balance deficits in order to decrease his fall risk and promote increased independence with functional mobility and ADLs.    OBJECTIVE IMPAIRMENTS: Abnormal gait, decreased activity tolerance, decreased balance, decreased cognition, decreased endurance, decreased knowledge of condition, decreased knowledge of use of DME, decreased mobility, difficulty walking, decreased strength, decreased safety awareness, hypomobility, impaired flexibility, impaired sensation, improper body mechanics, postural dysfunction, and pain.   ACTIVITY LIMITATIONS: carrying, lifting, bending, standing, squatting, sleeping, stairs, transfers, bed mobility, continence, bathing, toileting, dressing, reach over head, hygiene/grooming, and locomotion level  PARTICIPATION LIMITATIONS: meal prep, cleaning, laundry,  medication management, driving, and community activity  PERSONAL FACTORS: Age, Sex, Time since onset of injury/illness/exacerbation, and 3+ comorbidities: Parkinson's, deep brain stim, depression, lipids, HTN, GERD, ED, dementia, seizures/falls, LBP/PSF, hx of lumbar fusion in 04/2022, hx of hip fx 08/2022, arthritis, frequent falls,  and OSA are also affecting patient's functional outcome.   REHAB POTENTIAL: Good  CLINICAL DECISION MAKING: Evolving/moderate complexity  EVALUATION COMPLEXITY: Moderate  PLAN:  PT FREQUENCY: 1-2x/week  PT DURATION: 12 weeks  PLANNED INTERVENTIONS: 97164- PT Re-evaluation, 97110-Therapeutic exercises, 97530- Therapeutic activity, 97112- Neuromuscular re-education, 97535- Self Care, 81191- Manual therapy, [phone]- Gait training, [phone]- Orthotic Fit/training, [phone]- Electrical stimulation (manual), Patient/Family education, Balance training, Stair training, Taping, Joint mobilization, Joint manipulation, Spinal mobilization, Vestibular training, Cognitive remediation, DME instructions, Cryotherapy, and Moist heat  PLAN FOR NEXT SESSION:   *add dual-task challenges to interventions* Balance:   - posterior stepping  strategy training with R posterolateral stepping (stepping over 1/2 foam roll with theraband resistance to external target)  - static standing balance with trunk rotation/reaching tasks (specifically posteriorly) Dynamic Gait without AD - increase intensity of gait training:  - backwards walking  - quick/sudden turning  - Head rotations  Strengthening:  - reinforce R hip and gluteal strengthening - bridge with resistance - clamshells! (Or supine hooklying hip abduction) - strengthening hips to create wider BOS for improved balance during gait Stretching into anterior pelvic tilt Continue    Jaiya Mooradian, PT, DPT, NCS, CSRS Physical Therapist -   [facility]  10:15 PM 08/24/23

## 2023-08-21 NOTE — Therapy (Signed)
 OUTPATIENT PHYSICAL THERAPY NEURO TREATMENT  Physical Therapy Progress Note   Dates of reporting period  07/13/2023   to   08/21/2023   Patient Name: [name] MRN: [id number] DOB:Sep 22, 1950, 73 y.o., male Today's Date: 08/21/2023   PCP: Adelita Honer, PA-C REFERRING PROVIDER: Jacumin, Erika Leigh, PA   END OF SESSION:    PT End of Session - 08/21/23 1107     Visit Number 20    Number of Visits 24    Date for PT Re-Evaluation 08/24/23    Authorization Type [employer] [id number]    PT Start Time 1106    PT Stop Time 1149    PT Time Calculation (min) 43 min    Equipment Utilized During Treatment Gait belt    Activity Tolerance Patient tolerated treatment well;No increased pain    Behavior During Therapy WFL for tasks assessed/performed                Past Medical History:  Diagnosis Date   Anxiety disorder    Arthritis    Bradycardia    Cancer (HCC) 2013   skin cancer   Depression    ptsd   Dysrhythmia    chronic slow heart rate   GERD (gastroesophageal reflux disease)    H/O tachycardia-bradycardia syndrome    Headache(784.0)    tension headaches non recent   History of chicken pox    History of kidney stones    passed   Hypertension    treated with HCTZ   Major depressive disorder    Mild aortic regurgitation    Pacemaker    Parkinson's disease (HCC)    dx'ed 15 years ago   PTSD (post-traumatic stress disorder)    S/P deep brain stimulator placement    Shortness of breath dyspnea    Sleep apnea    doesn't use C-pap   Unspecified dementia, moderate, with psychotic disturbance (HCC)    Varicose veins    Past Surgical History:  Procedure Laterality Date   CATARACT EXTRACTION W/PHACO Right 01/17/2023   Procedure: CATARACT EXTRACTION PHACO AND INTRAOCULAR LENS PLACEMENT (IOC) RIGHT 6.33 00:42.6;  Surgeon: Clair Crews, MD;  Location: [facility];  Service: Ophthalmology;  Laterality: Right;   CATARACT EXTRACTION W/PHACO Left 02/07/2023    Procedure: CATARACT EXTRACTION PHACO AND INTRAOCULAR LENS PLACEMENT (IOC) LEFT 6.35 00:38.3;  Surgeon: Clair Crews, MD;  Location: [facility];  Service: Ophthalmology;  Laterality: Left;   CHOLECYSTECTOMY N/A 10/22/2014   Procedure: LAPAROSCOPIC CHOLECYSTECTOMY WITH INTRAOPERATIVE CHOLANGIOGRAM;  Surgeon: Rhina Center III, MD;  Location: [facility];  Service: General;  Laterality: N/A;   cyst removed      from lip as a child   INTRAMEDULLARY (IM) NAIL INTERTROCHANTERIC N/A 02/18/2019   Procedure: INTRAMEDULLARY (IM) NAIL INTERTROCHANTRIC, RIGHT,;  Surgeon: Rande Bushy, MD;  Location: [facility];  Service: Orthopedics;  Laterality: N/A;   LUMBAR LAMINECTOMY/DECOMPRESSION MICRODISCECTOMY Bilateral 12/14/2012   Procedure: Bilateral lumbar three-four, four-five decompressive laminotomy/foraminotomy;  Surgeon: Baruch Bosch, MD;  Location: [facility];  Service: Neurosurgery;  Laterality: Bilateral;   PULSE GENERATOR IMPLANT Bilateral 12/13/2013   Procedure: Bilateral implantable pulse generator placement;  Surgeon: Manya Sells, MD;  Location: [facility];  Service: Neurosurgery;  Laterality: Bilateral;  Bilateral implantable pulse generator placement   skin cancer removed     from ears,   12 lft arm  rt leg 15   SUBTHALAMIC STIMULATOR BATTERY REPLACEMENT Bilateral 07/14/2017   Procedure: BILATERAL IMPLANTED PULSE GENERATOR CHANGE FOR DEEP BRAIN  STIMULATOR;  Surgeon: Manya Sells, MD;  Location[facility];  Service: Neurosurgery;  Laterality: Bilateral;   SUBTHALAMIC STIMULATOR INSERTION Bilateral 12/06/2013   Procedure: SUBTHALAMIC STIMULATOR INSERTION;  Surgeon: Manya Sells, MD;  Location: [facility];  Service: Neurosurgery;  Laterality: Bilateral;  Bilateral deep brain stimulator placement   Patient Active Problem List   Diagnosis Date Noted   Hematuria 10/10/2021   Sinus drainage 12/09/2020   Medicare annual wellness visit, subsequent 11/18/2020   Pacemaker    Dementia associated  with Parkinson's disease (HCC) 06/17/2020   Vertigo 02/19/2020   Urine abnormality 02/19/2020   Dysphagia 02/23/2019   Fall at home 10/18/2017   REM behavioral disorder 11/02/2016   Radicular pain in right arm 10/09/2016   GERD (gastroesophageal reflux disease) 07/31/2016   Colon cancer screening 07/23/2015   Gout 02/18/2015   Depression 01/06/2015   Skin lesion 10/02/2014   Dupuytren's contracture 08/20/2014   Cough 07/21/2014   Lumbar stenosis with neurogenic claudication 06/13/2014   S/P deep brain stimulator placement 05/08/2014   Advance care planning 01/19/2014   Parkinson's disease (HCC) 12/13/2013   PTSD (post-traumatic stress disorder) 06/13/2013   Erectile dysfunction 06/13/2013   HLD (hyperlipidemia) 06/13/2013   Essential hypertension 06/03/2013   Bradycardia by electrocardiogram 06/03/2013   Obstructive sleep apnea 03/12/2013    ONSET DATE: ~15 years ago when pt was diagnosed with Parkinson's  REFERRING DIAG: G20.A1 (ICD-10-CM) - Parkinsons disease (HCC)   THERAPY DIAG:  Abnormality of gait and mobility  Unsteadiness on feet  Difficulty in walking, not elsewhere classified  Muscle weakness (generalized)  Other lack of coordination  Repeated falls  Other abnormalities of gait and mobility  Rationale for Evaluation and Treatment: Rehabilitation  SUBJECTIVE:                                                                                                                                                                                             SUBJECTIVE STATEMENT:  Pt reports he had an OK weekend, reports he wants to go outside more but, he has to have someone with him and they aren't always willing to go outside when he wants to. Pt reports his L shoulder is hurting, unrated. States he tossed and turned last night. No falls reported.  *Of note: pt requires increased time for verbal responses to therapist's questions and pt is easily distracted in busy  environment  Pt accompanied by: self and his caregiver (in waiting room)  PERTINENT HISTORY: Parkinson's, deep brain stim, depression, lipids, HTN, GERD, ED, dementia, seizures/falls, LBP/PSF, hx of lumbar fusion in 04/2022, hx of hip fx 08/2022, arthritis, frequent falls,  and OSA   PAIN:  Are you having pain? Yes: NPRS scale: 7/10 with activity below, but otherwise 3/10 at rest  Pain location: L calvicle Pain description: ache to sharp Aggravating factors: when going to push-up to stand or control himself when sitting using armrest Relieving factors: rest and support in arm sling  PRECAUTIONS: Fall and Other:    frequent falls, most recent on 05/22/2023 with ED visit, L superolateral clavicular fx (out of sling on 06/15/23 due to pt/family discontinuing it), deep brain stimulator  RED FLAGS: Bowel or bladder incontinence: Yes: "on occasion" has incontinence    WEIGHT BEARING RESTRICTIONS: Yes unsure of specific L UE WBing restrictions, but pt currently using an AD for ambulation, while wearing basic arm sling  FALLS: Has patient fallen in last 6 months? Yes. Number of falls 10+  most recent on 05/22/2023 with injury to L shoulder  LIVING ENVIRONMENT: Lives with: lives with their spouse (wife, Radio broadcast assistant) and has hired caregivers Lives in: House/apartment Stairs: Yes: External: 2-3 steps; bilateral but cannot reach both (due to current home renovations, this is the entrance he uses) primarily uses R HR Has following equipment at home: Wheelchair (manual), shower chair, Grab bars, and Up-Walker  PLOF: Independent with household mobility with device, Requires assistive device for independence, Needs assistance with ADLs, and meal prep   Pt reports he frequently leaves his AD to the side when going to get something, often forgetting it - he requires reinforced education on consistent use of U-step walker  PATIENT GOALS: Improve Balance  OBJECTIVE:  Note: Objective measures were completed at  Evaluation unless otherwise noted.  DIAGNOSTIC FINDINGS:   CT CERVICAL SPINE WITHOUT CONTRAST on 05/22/2023 IMPRESSION: Disc levels: Disc space narrowing and spurring in the lower cervical spine. Mild bilateral degenerative facet disease, left greater than right. Degenerative disc and facet disease throughout the cervical spine. No acute bony abnormality.  CT HEAD WITHOUT CONTRAST on 05/22/2023 FINDINGS: Brain: Deep brain stimulators are noted with the tips in the region of the thalami bilaterally, unchanged. Mild atrophy. No acute intracranial abnormality. Specifically, no hemorrhage, hydrocephalus, mass lesion, acute infarction, or significant intracranial injury. Vascular: No hyperdense vessel or unexpected calcification. Skull: No acute calvarial abnormality. Sinuses/Orbits: No acute findings Other: None IMPRESSION: No acute intracranial abnormality.  LEFT SHOULDER - 2+ VIEW on 05/22/2023 IMPRESSION: Acute mildly displaced fracture of the lateral aspect of the left clavicle with intra-articular extension.   COGNITION: Overall cognitive status: History of cognitive impairments - at baseline and pt with very delayed verbal responses to subjective questioning and noted to be easily distracted in the therapy gym environment   SENSATION: Light touch: Impaired  and decreased distally in B LEs, but specifically decreased throughout R LE compared to L LE   COORDINATION: Not formally assessed  EDEMA:  Not formally assessed, but no significant swelling on visual exam  MUSCLE TONE: WFL  MUSCLE LENGTH: Hamstrings: Right ____ deg; Left ___ deg  *not assessed, but would benefit from testing  DTRs:  Not formally assessed  POSTURE: rounded shoulders, forward head, decreased lumbar lordosis, and posterior pelvic tilt, posterior weight shift bias  LOWER EXTREMITY ROM:     Active  Not formally assessed Right Eval Left Eval  Hip flexion    Hip extension    Hip  abduction    Hip adduction    Hip internal rotation    Hip external rotation    Knee flexion    Knee extension    Ankle dorsiflexion Would  benefit from testing Would benefit from testing  Ankle plantarflexion    Ankle inversion    Ankle eversion     (Blank rows = not tested)  LOWER EXTREMITY MMT:    MMT Right Eval Left Eval  Hip flexion 4 4  Hip extension    Hip abduction    Hip adduction    Hip internal rotation    Hip external rotation    Knee flexion 4+ 5  Knee extension 4+ 4+  Ankle dorsiflexion 4- 4  Ankle plantarflexion 4 4-  Ankle inversion    Ankle eversion    (Blank rows = not tested)  Manual Muscle Test Scale 0/5 = No muscle contraction can be seen or felt 1/5 = Contraction can be felt, but there is no motion 2-/5 = Part moves through incomplete ROM w/ gravity decreased 2/5 = Part moves through complete ROM w/ gravity decreased 2+/5 = Part moves through incomplete ROM (<50%) against gravity or through complete ROM w/ gravity 3-/5 = Part moves through incomplete ROM (>50%) against gravity 3/5 = Part moves through complete ROM against gravity 3+/5 = Part moves through complete ROM against gravity/slight resistance 4-/5= Holds test position against slight to moderate pressure 4/5 = Part moves through complete ROM against gravity/moderate resistance 4+/5= Holds test position against moderate to strong pressure 5/5 = Part moves through complete ROM against gravity/full resistance  BED MOBILITY:  Sit to supine Min A and Mod A Supine to sit Min A and Mod A Requires adjustable bed with bedrail at home   TRANSFERS: Assistive device utilized:  Up-Walker   Sit to stand: SBA, CGA, and Min A Stand to sit: SBA, CGA, and Min A Chair to chair: CGA and Min A Floor:  Not assessed **Requires variable assist levels for transfers due to imbalance  RAMP:  Level of Assistance:    Assistive device utilized:  Ramp Comments: Not formally assessed, may benefit from testing  in future  CURB:  Level of Assistance:  Assistive device utilized:  Curb Comments: Not formally assessed, may benefit from testing in future  STAIRS: Level of Assistance:  06/12/23: Min A Stair Negotiation Technique: Forwards with Bilateral Rails  Number of Stairs: 4  Height of Stairs: 6inch  Comments: 06/12/23 - posterior LOB at top of steps when turning around, minor posterior lean throughout  GAIT: Gait pattern: step through pattern, decreased step length- Right, decreased step length- Left, decreased stride length, knee flexed in stance- Right, knee flexed in stance- Left, shuffling, festinating, trunk flexed, wide BOS, poor foot clearance- Right, and poor foot clearance- Left Distance walked: ~176ft Assistive device utilized:  Up-Walker Level of assistance: CGA and Min A Comments: Noticeable festination when turning, stepping back to sit, or side stepping  FUNCTIONAL TESTS:  5 times sit to stand: 22 seconds Timed up and go (TUG): 06/12/23: 29.40 seconds using Up-Walker with CGA/min A 10 meter walk test: 0.4m/s Berg Balance Scale: 20/56  PATIENT SURVEYS:  ABC scale 06/12/23: 30%  TREATMENT DATE:  08/21/23  Therapy session focused on re-assessment of standardized outcome measures and subjective questionnaire to determine pt's progress with therapy thus far.   Pt arrived to therapy in transport chair.   Performed 180degree stand pivot transfer from transport chair>green chair with only CGA for balance and no AD.  Gait training ~163ft using clinic's Up-Walker to ensure proper fit of AD and to allow pt to get comfortable with clinic Up-Walker.   10 Meter Walk Test: Patient instructed to walk 10 meters (32.8 ft) as quickly and as safely as possible  Results: 0.685 m/s (14.20 seconds and 14.99 seconds) After warm-up: 0.928 m/s using Up-Walker with close  SBA for safety (10.50 seconds and 11.05 seconds)  Cut off scores:   Household Ambulator  < 0.4 m/s  Limited Community Ambulator  0.4 - 0.8 m/s  [employer]  > 0.8 m/s  Increased fall risk  < 1.85m/s  Crossing a Street  >1.3m/s  MCID 0.05 m/s (small), 0.13 m/s (moderate), 0.06 m/s (significant)  (ANPTA Core Set of Outcome Measures for Adults with Neurologic Conditions, 2018)     Participated in Timed Up and Go (TUG) using Up-Walker, close SBA/CGA for safety: 1st trial: 14.72 seconds, pt parks AD off to the side when going to sit 2nd trial: 17.54 seconds with pt keeping AD with him when turning to sit this time Average: 16.13 seconds using Up-Walker Patient demonstrates high fall risk as indicated by requiring >13.5seconds to complete the TUG.   Five times Sit to Stand Test (FTSS) "Stand up and sit down as quickly as possible 5 times, keeping your arms folded across your chest."    TIME: 12.17 seconds and 11 seconds without UE support, but pt having poor ability to come to standing on final rep of 2nd trial, having posterior LOB back into chair  Times > 13.6 seconds is associated with increased disability and morbidity (Guralnik, 2000) Times > 15 seconds is predictive of recurrent falls in healthy individuals aged 52 and older (Buatois, et al., 2008) Normal performance values in community dwelling individuals aged 66 and older (Bohannon, 2006): 60-69 years: 11.4 seconds 70-79 years: 12.6 seconds 80-89 years: 14.8 seconds  MCID: >= 2.3 seconds for Vestibular Disorders (Meretta, 2006)   Stair navigation training ascending/descending 4 steps x2 (no break) (6" height) using B HRs with close supervision assist, pt self-selecting reciprocal pattern in both directions.  Therapist provides pt education on importance of keeping AD with him when turning to sit as well as importance of reaching back with 1 hand for the chair armrest when going to sit. Pt has habit of reaching for  the AD when going to stand, however, feel this is safe as it promotes pt having increased anterior trunk lean when going to rise and decreases posterior LOB.   Patient participated in [facility] and demonstrates increased fall risk as noted by score of   34/56.  (<36= high risk for falls, close to 100%; 37-45 significant >80%; 46-51 moderate >50%; 52-55 lower >25%).   [facility] PT Assessment - 08/21/23 0001       Berg Balance Test   Sit to Stand Able to stand  independently using hands    Standing Unsupported Able to stand 2 minutes with supervision    Sitting with Back Unsupported but Feet Supported on Floor or Stool Able to sit safely and securely 2 minutes    Stand to Sit Sits safely with minimal use of hands    Transfers  Able to transfer safely, definite need of hands   transfers vary depending on fatigue, sometimes pt has to use hands and has posterior LOB   Standing Unsupported with Eyes Closed Able to stand 10 seconds with supervision    Standing Unsupported with Feet Together Able to place feet together independently but unable to hold for 30 seconds   has posterior LOB at 47 seconds   From Standing, Reach Forward with Outstretched Arm Can reach forward >5 cm safely (2")    From Standing Position, Pick up Object from Floor Able to pick up shoe, needs supervision    From Standing Position, Turn to Look Behind Over each Shoulder Turn sideways only but maintains balance    Turn 360 Degrees Able to turn 360 degrees safely but slowly    Standing Unsupported, Alternately Place Feet on Step/Stool Able to complete >2 steps/needs minimal assist   only requries CGA to do 4 steps   Standing Unsupported, One Foot in Front Able to take small step independently and hold 30 seconds    Standing on One Leg Unable to try or needs assist to prevent fall   tries standing on L LE with LOB   Total Score 34               PATIENT EDUCATION: Education details: PT POC, LTGs, findings on balance  assessments today Person educated: Patient and Caregiver Angelica Education method: Explanation Education comprehension: verbalized understanding and needs further education  HOME EXERCISE PROGRAM:   Access Code: [email or web address] URL: [email or web address] Date: 06/28/2023 Prepared by: Carlen Chasten  Exercises - Walking  - 1 x daily - 7 x weekly - 2 sets - 3-5 minutes hold - Sit to Stand  - 1 x daily - 7 x weekly - 2 sets - 12-15 reps - Side Stepping with Resistance at Thighs and Counter Support  - 1 x daily - 7 x weekly - 3 sets - 10 reps - Supine Bridge with Resistance Band  - 1 x daily - 7 x weekly - 2 sets - 10-15 reps - Clamshell  - 1 x daily - 7 x weekly - 3 sets - 10 reps  GOALS: Goals reviewed with patient? Yes  SHORT TERM GOALS: Target date: 07/13/2023    Patient will be independent in home exercise program to improve strength/mobility for better functional independence with ADLs. Baseline: 06/20/2023: HEP provided 06/28/2023: updated 08/21/2023: pt inconsistent with compliance  Goal status: ONGOING   2. Patient will report <2 falls over past 6 weeks to demonstrate improved safety awareness at home.  Baseline: 06/01/2023 = 10+ falls in past 6 months; 3/27: 10+ falls per patient report in past 6 weeks; 08/21/2023: at least 5+ falls in past 6 weeks Goal Status: ONGOING  LONG TERM GOALS: Target date: 08/24/2023   1.  Patient (> 1 years old) will complete five times sit to stand test in < 15 seconds indicating an increased LE strength and improved balance. Baseline: 06/01/23: 22 seconds with posterior LOB; 3/27: 11.51 seconds; 5/5: 12.17 seconds  Goal status: MET  2.  Patient will increase Berg Balance score by > 6 points to demonstrate decreased fall risk during functional activities. Baseline: 06/01/23: 20/56; 3/27: 23/56; 5/5: 34/56  Goal status: ONGOING   3. Patient will reduce timed up and go to <11 seconds to reduce fall risk and demonstrate improved transfer/gait  ability. Baseline: 06/12/23: 29.40 seconds using Up-Walker with CGA/min A; 3/27: 22.29 seconds with Up-walker and posterior LOB upon standing; 5/5:  16.13 seconds using Up-Walker with close SBA for safety Goal status: ONGOING  4.Patient will increase 10 meter walk test to >1.49m/s as to improve gait speed for better community ambulation and to reduce fall risk. Baseline: 06/01/23: 0.66m/s using Up-Walker; 3/27: 0.89 m/s using Up Walker, 5/5: 0.928 m/s using Up-Walker (after warm-up) Goal status: ONGOING  5.  Patient will require no more than supervision assist with ascending/descending 3 steps using handrails per home set-up.  Baseline: 06/12/23: requires min A using HR due to posterior lean/LOB especially when turning at the top; 3/27: minA required using HR due to posterior LOB on first step; 5/5: supervision using bilateral HRs and self-selecting reciprocal pattern (no LOB)  Goal status: ONGOING   ASSESSMENT:  CLINICAL IMPRESSION:    Patient arrives to treatment session motivated to participate. Therapy session focused on re-assessment of standardized outcome measures to determine pt's progress with therapy thus far. Patient demonstrates improvement on all assessments except for 5xSTS; however, he is within 0.66seconds of his prior score. Patient demonstrates improved standing balance as noted on Berg Balance Test, improved gait speed on , and improved functional transfers and gait as noted on TUG. Patient also demonstrates increased independence with stair navigation requiring only supervision assist with use of HR support today. Patient showing excellent progression with therapy; however, currently remains in high fall risk categories on all assessments indicating the need for continued therapy. Mr. Saller will benefit from continued skilled physical therapy to further address his balance deficits in order to decrease his fall risk and promote increased independence with functional mobility and  ADLs.  Patient's condition has the potential to improve in response to therapy. Maximum improvement is yet to be obtained. The anticipated improvement is attainable and reasonable in a generally predictable time.     OBJECTIVE IMPAIRMENTS: Abnormal gait, decreased activity tolerance, decreased balance, decreased cognition, decreased endurance, decreased knowledge of condition, decreased knowledge of use of DME, decreased mobility, difficulty walking, decreased strength, decreased safety awareness, hypomobility, impaired flexibility, impaired sensation, improper body mechanics, postural dysfunction, and pain.   ACTIVITY LIMITATIONS: carrying, lifting, bending, standing, squatting, sleeping, stairs, transfers, bed mobility, continence, bathing, toileting, dressing, reach over head, hygiene/grooming, and locomotion level  PARTICIPATION LIMITATIONS: meal prep, cleaning, laundry, medication management, driving, and community activity  PERSONAL FACTORS: Age, Sex, Time since onset of injury/illness/exacerbation, and 3+ comorbidities: Parkinson's, deep brain stim, depression, lipids, HTN, GERD, ED, dementia, seizures/falls, LBP/PSF, hx of lumbar fusion in 04/2022, hx of hip fx 08/2022, arthritis, frequent falls,  and OSA  are also affecting patient's functional outcome.   REHAB POTENTIAL: Good  CLINICAL DECISION MAKING: Evolving/moderate complexity  EVALUATION COMPLEXITY: Moderate  PLAN:  PT FREQUENCY: 1-2x/week  PT DURATION: 12 weeks  PLANNED INTERVENTIONS: 97164- PT Re-evaluation, 97110-Therapeutic exercises, 97530- Therapeutic activity, 97112- Neuromuscular re-education, 97535- Self Care, 56213- Manual therapy, [phone]- Gait training, [phone]- Orthotic Fit/training, [phone]- Electrical stimulation (manual), Patient/Family education, Balance training, Stair training, Taping, Joint mobilization, Joint manipulation, Spinal mobilization, Vestibular training, Cognitive remediation, DME instructions,  Cryotherapy, and Moist heat  PLAN FOR NEXT SESSION:   *add dual-task challenges to interventions* Balance:   - posterior stepping strategy training with R posterolateral stepping (stepping over 1/2 foam roll with theraband resistance to external target)  - static standing balance with trunk rotation/reaching tasks (specifically posteriorly) Dynamic Gait without AD - increase intensity of gait training:  - backwards walking  - quick/sudden turning  - Head rotations  Strengthening:  - reinforce R hip and gluteal strengthening - bridge with  resistance - clamshells! (Or supine hooklying hip abduction) - strengthening hips to create wider BOS for improved balance during gait Stretching into anterior pelvic tilt    Consetta Cosner, PT, DPT, NCS, CSRS Physical Therapist - [facility]  [facility]  11:53 AM 08/21/23

## 2023-08-24 ENCOUNTER — Ambulatory Visit: Payer: Medicare PPO | Admitting: Physical Therapy

## 2023-08-24 DIAGNOSIS — R296 Repeated falls: Secondary | ICD-10-CM

## 2023-08-24 DIAGNOSIS — R262 Difficulty in walking, not elsewhere classified: Secondary | ICD-10-CM

## 2023-08-24 DIAGNOSIS — R2681 Unsteadiness on feet: Secondary | ICD-10-CM

## 2023-08-24 DIAGNOSIS — R278 Other lack of coordination: Secondary | ICD-10-CM | POA: Diagnosis not present

## 2023-08-24 DIAGNOSIS — R2689 Other abnormalities of gait and mobility: Secondary | ICD-10-CM

## 2023-08-24 DIAGNOSIS — M6281 Muscle weakness (generalized): Secondary | ICD-10-CM | POA: Diagnosis not present

## 2023-08-24 DIAGNOSIS — R269 Unspecified abnormalities of gait and mobility: Secondary | ICD-10-CM | POA: Diagnosis not present

## 2023-08-24 DIAGNOSIS — G20A1 Parkinson's disease without dyskinesia, without mention of fluctuations: Secondary | ICD-10-CM | POA: Diagnosis not present

## 2023-08-28 ENCOUNTER — Ambulatory Visit: Payer: Medicare PPO | Admitting: Physical Therapy

## 2023-08-28 DIAGNOSIS — R269 Unspecified abnormalities of gait and mobility: Secondary | ICD-10-CM | POA: Diagnosis not present

## 2023-08-28 DIAGNOSIS — R2689 Other abnormalities of gait and mobility: Secondary | ICD-10-CM

## 2023-08-28 DIAGNOSIS — R278 Other lack of coordination: Secondary | ICD-10-CM | POA: Diagnosis not present

## 2023-08-28 DIAGNOSIS — R296 Repeated falls: Secondary | ICD-10-CM

## 2023-08-28 DIAGNOSIS — M6281 Muscle weakness (generalized): Secondary | ICD-10-CM

## 2023-08-28 DIAGNOSIS — G20A1 Parkinson's disease without dyskinesia, without mention of fluctuations: Secondary | ICD-10-CM | POA: Diagnosis not present

## 2023-08-28 DIAGNOSIS — R2681 Unsteadiness on feet: Secondary | ICD-10-CM | POA: Diagnosis not present

## 2023-08-28 DIAGNOSIS — R262 Difficulty in walking, not elsewhere classified: Secondary | ICD-10-CM

## 2023-08-28 NOTE — Therapy (Signed)
 OUTPATIENT PHYSICAL THERAPY NEURO TREATMENT   Patient Name: [name] MRN: [id number] DOB:[birth year], 73 y.o., male Today's Date: 08/28/2023   PCP: Adelita Honer, PA-C REFERRING PROVIDER: Jacumin, Erika Leigh, PA   END OF SESSION:     PT End of Session - 08/28/23 1317     Visit Number 22    Number of Visits 24    Date for PT Re-Evaluation 11/16/23    Authorization Type [employer] [id number]    PT Start Time 1317    PT Stop Time 1400    PT Time Calculation (min) 43 min    Equipment Utilized During Treatment Gait belt    Activity Tolerance Patient tolerated treatment well    Behavior During Therapy WFL for tasks assessed/performed                Past Medical History:  Diagnosis Date   Anxiety disorder    Arthritis    Bradycardia    Cancer (HCC) 2013   skin cancer   Depression    ptsd   Dysrhythmia    chronic slow heart rate   GERD (gastroesophageal reflux disease)    H/O tachycardia-bradycardia syndrome    Headache(784.0)    tension headaches non recent   History of chicken pox    History of kidney stones    passed   Hypertension    treated with HCTZ   Major depressive disorder    Mild aortic regurgitation    Pacemaker    Parkinson's disease (HCC)    dx'ed 15 years ago   PTSD (post-traumatic stress disorder)    S/P deep brain stimulator placement    Shortness of breath dyspnea    Sleep apnea    doesn't use C-pap   Unspecified dementia, moderate, with psychotic disturbance (HCC)    Varicose veins    Past Surgical History:  Procedure Laterality Date   CATARACT EXTRACTION W/PHACO Right 01/17/2023   Procedure: CATARACT EXTRACTION PHACO AND INTRAOCULAR LENS PLACEMENT (IOC) RIGHT 6.33 00:42.6;  Surgeon: Clair Crews, MD;  Location: [facility];  Service: Ophthalmology;  Laterality: Right;   CATARACT EXTRACTION W/PHACO Left 02/07/2023   Procedure: CATARACT EXTRACTION PHACO AND INTRAOCULAR LENS PLACEMENT (IOC) LEFT 6.35 00:38.3;  Surgeon:  Clair Crews, MD;  Location: [facility];  Service: Ophthalmology;  Laterality: Left;   CHOLECYSTECTOMY N/A 10/22/2014   Procedure: LAPAROSCOPIC CHOLECYSTECTOMY WITH INTRAOPERATIVE CHOLANGIOGRAM;  Surgeon: Rhina Center III, MD;  Location: [facility];  Service: General;  Laterality: N/A;   cyst removed      from lip as a child   INTRAMEDULLARY (IM) NAIL INTERTROCHANTERIC N/A 02/18/2019   Procedure: INTRAMEDULLARY (IM) NAIL INTERTROCHANTRIC, RIGHT,;  Surgeon: Rande Bushy, MD;  Location: [facility];  Service: Orthopedics;  Laterality: N/A;   LUMBAR LAMINECTOMY/DECOMPRESSION MICRODISCECTOMY Bilateral 12/14/2012   Procedure: Bilateral lumbar three-four, four-five decompressive laminotomy/foraminotomy;  Surgeon: Baruch Bosch, MD;  Location: [facility];  Service: Neurosurgery;  Laterality: Bilateral;   PULSE GENERATOR IMPLANT Bilateral 12/13/2013   Procedure: Bilateral implantable pulse generator placement;  Surgeon: Manya Sells, MD;  Location: [facility];  Service: Neurosurgery;  Laterality: Bilateral;  Bilateral implantable pulse generator placement   skin cancer removed     from ears,   12 lft arm  rt leg 15   SUBTHALAMIC STIMULATOR BATTERY REPLACEMENT Bilateral 07/14/2017   Procedure: BILATERAL IMPLANTED PULSE GENERATOR CHANGE FOR DEEP BRAIN STIMULATOR;  Surgeon: Manya Sells, MD;  Location: [facility];  Service: Neurosurgery;  Laterality: Bilateral;   SUBTHALAMIC STIMULATOR  INSERTION Bilateral 12/06/2013   Procedure: SUBTHALAMIC STIMULATOR INSERTION;  Surgeon: Manya Sells, MD;  Location: [facility];  Service: Neurosurgery;  Laterality: Bilateral;  Bilateral deep brain stimulator placement   Patient Active Problem List   Diagnosis Date Noted   Hematuria 10/10/2021   Sinus drainage 12/09/2020   Medicare annual wellness visit, subsequent 11/18/2020   Pacemaker    Dementia associated with Parkinson's disease (HCC) 06/17/2020   Vertigo 02/19/2020   Urine abnormality 02/19/2020   Dysphagia  02/23/2019   Fall at home 10/18/2017   REM behavioral disorder 11/02/2016   Radicular pain in right arm 10/09/2016   GERD (gastroesophageal reflux disease) 07/31/2016   Colon cancer screening 07/23/2015   Gout 02/18/2015   Depression 01/06/2015   Skin lesion 10/02/2014   Dupuytren's contracture 08/20/2014   Cough 07/21/2014   Lumbar stenosis with neurogenic claudication 06/13/2014   S/P deep brain stimulator placement 05/08/2014   Advance care planning 01/19/2014   Parkinson's disease (HCC) 12/13/2013   PTSD (post-traumatic stress disorder) 06/13/2013   Erectile dysfunction 06/13/2013   HLD (hyperlipidemia) 06/13/2013   Essential hypertension 06/03/2013   Bradycardia by electrocardiogram 06/03/2013   Obstructive sleep apnea 03/12/2013    ONSET DATE: ~15 years ago when pt was diagnosed with Parkinson's  REFERRING DIAG: G20.A1 (ICD-10-CM) - Parkinsons disease (HCC)   THERAPY DIAG:  Abnormality of gait and mobility  Unsteadiness on feet  Difficulty in walking, not elsewhere classified  Muscle weakness (generalized)  Other lack of coordination  Repeated falls  Other abnormalities of gait and mobility  Rationale for Evaluation and Treatment: Rehabilitation  SUBJECTIVE:                                                                                                                                                                                             SUBJECTIVE STATEMENT:  Pt reports he couldn't sleep last night, fell asleep at 6AM this morning. Pt reports he is "tired." Pt denies pain to start session.   Pt reports having a fall since last therapy session, stating he slid down at the foot of his bed onto the chest. Denies injury with the fall.   *Of note: pt requires increased time for verbal responses to therapist's questions and pt is easily distracted in busy environment  Pt accompanied by: self and his caregiver (in waiting room)  PERTINENT HISTORY:  Parkinson's, deep brain stim, depression, lipids, HTN, GERD, ED, dementia, seizures/falls, LBP/PSF, hx of lumbar fusion in 04/2022, hx of hip fx 08/2022, arthritis, frequent falls,  and OSA   PAIN:  Are you having pain? Yes: NPRS scale: 7/10 with activity below,  but otherwise 3/10 at rest  Pain location: L calvicle Pain description: ache to sharp Aggravating factors: when going to push-up to stand or control himself when sitting using armrest Relieving factors: rest and support in arm sling  PRECAUTIONS: Fall and Other:   frequent falls, most recent on 05/22/2023 with ED visit, L superolateral clavicular fx (out of sling on 06/15/23 due to pt/family discontinuing it), deep brain stimulator  RED FLAGS: Bowel or bladder incontinence: Yes: "on occasion" has incontinence   WEIGHT BEARING RESTRICTIONS: Yes unsure of specific L UE WBing restrictions, but pt currently using an AD for ambulation, while wearing basic arm sling  FALLS: Has patient fallen in last 6 months? Yes. Number of falls 10+ most recent on 05/22/2023 with injury to L shoulder  LIVING ENVIRONMENT: Lives with: lives with their spouse (wife, Radio broadcast assistant) and has hired caregivers Lives in: House/apartment Stairs: Yes: External: 2-3 steps; bilateral but cannot reach both (due to current home renovations, this is the entrance he uses) primarily uses R HR Has following equipment at home: Wheelchair (manual), shower chair, Grab bars, and Up-Walker  PLOF: Independent with household mobility with device, Requires assistive device for independence, Needs assistance with ADLs, and meal prep  Pt reports he frequently leaves his AD to the side when going to get something, often forgetting it - he requires reinforced education on consistent use of U-step walker  PATIENT GOALS: Improve Balance  OBJECTIVE:  Note: Objective measures were completed at Evaluation unless otherwise noted.  DIAGNOSTIC FINDINGS:   CT CERVICAL SPINE WITHOUT CONTRAST on  05/22/2023 IMPRESSION: Disc levels: Disc space narrowing and spurring in the lower cervical spine. Mild bilateral degenerative facet disease, left greater than right. Degenerative disc and facet disease throughout the cervical spine. No acute bony abnormality.  CT HEAD WITHOUT CONTRAST on 05/22/2023 FINDINGS: Brain: Deep brain stimulators are noted with the tips in the region of the thalami bilaterally, unchanged. Mild atrophy. No acute intracranial abnormality. Specifically, no hemorrhage, hydrocephalus, mass lesion, acute infarction, or significant intracranial injury. Vascular: No hyperdense vessel or unexpected calcification. Skull: No acute calvarial abnormality. Sinuses/Orbits: No acute findings Other: None IMPRESSION: No acute intracranial abnormality.  LEFT SHOULDER - 2+ VIEW on 05/22/2023 IMPRESSION: Acute mildly displaced fracture of the lateral aspect of the left clavicle with intra-articular extension.   COGNITION: Overall cognitive status: History of cognitive impairments - at baseline and pt with very delayed verbal responses to subjective questioning and noted to be easily distracted in the therapy gym environment   SENSATION: Light touch: Impaired  and decreased distally in B LEs, but specifically decreased throughout R LE compared to L LE   COORDINATION: Not formally assessed  EDEMA:  Not formally assessed, but no significant swelling on visual exam  MUSCLE TONE: WFL  MUSCLE LENGTH: Hamstrings: Right ____ deg; Left ___ deg  *not assessed, but would benefit from testing  DTRs:  Not formally assessed  POSTURE: rounded shoulders, forward head, decreased lumbar lordosis, and posterior pelvic tilt, posterior weight shift bias  LOWER EXTREMITY ROM:     Active  Not formally assessed Right Eval Left Eval  Hip flexion    Hip extension    Hip abduction    Hip adduction    Hip internal rotation    Hip external rotation    Knee flexion    Knee  extension    Ankle dorsiflexion Would benefit from testing Would benefit from testing  Ankle plantarflexion    Ankle inversion    Ankle eversion     (  Blank rows = not tested)  LOWER EXTREMITY MMT:    MMT Right Eval Left Eval  Hip flexion 4 4  Hip extension    Hip abduction    Hip adduction    Hip internal rotation    Hip external rotation    Knee flexion 4+ 5  Knee extension 4+ 4+  Ankle dorsiflexion 4- 4  Ankle plantarflexion 4 4-  Ankle inversion    Ankle eversion    (Blank rows = not tested)  Manual Muscle Test Scale 0/5 = No muscle contraction can be seen or felt 1/5 = Contraction can be felt, but there is no motion 2-/5 = Part moves through incomplete ROM w/ gravity decreased 2/5 = Part moves through complete ROM w/ gravity decreased 2+/5 = Part moves through incomplete ROM (<50%) against gravity or through complete ROM w/ gravity 3-/5 = Part moves through incomplete ROM (>50%) against gravity 3/5 = Part moves through complete ROM against gravity 3+/5 = Part moves through complete ROM against gravity/slight resistance 4-/5= Holds test position against slight to moderate pressure 4/5 = Part moves through complete ROM against gravity/moderate resistance 4+/5= Holds test position against moderate to strong pressure 5/5 = Part moves through complete ROM against gravity/full resistance  BED MOBILITY:  Sit to supine Min A and Mod A Supine to sit Min A and Mod A Requires adjustable bed with bedrail at home   TRANSFERS: Assistive device utilized: Up-Walker  Sit to stand: SBA, CGA, and Min A Stand to sit: SBA, CGA, and Min A Chair to chair: CGA and Min A Floor: Not assessed **Requires variable assist levels for transfers due to imbalance  RAMP:  Level of Assistance:   Assistive device utilized:  Ramp Comments: Not formally assessed, may benefit from testing in future  CURB:  Level of Assistance:  Assistive device utilized:  Curb Comments: Not formally  assessed, may benefit from testing in future  STAIRS: Level of Assistance: 06/12/23: Min A Stair Negotiation Technique: Forwards with Bilateral Rails  Number of Stairs: 4  Height of Stairs: 6inch  Comments: 06/12/23 - posterior LOB at top of steps when turning around, minor posterior lean throughout  GAIT: Gait pattern: step through pattern, decreased step length- Right, decreased step length- Left, decreased stride length, knee flexed in stance- Right, knee flexed in stance- Left, shuffling, festinating, trunk flexed, wide BOS, poor foot clearance- Right, and poor foot clearance- Left Distance walked: ~119ft Assistive device utilized: Up-Walker Level of assistance: CGA and Min A Comments: Noticeable festination when turning, stepping back to sit, or side stepping  FUNCTIONAL TESTS:  5 times sit to stand: 22 seconds Timed up and go (TUG): 06/12/23: 29.40 seconds using Up-Walker with CGA/min A 10 meter walk test: 0.63m/s Berg Balance Scale: 20/56  PATIENT SURVEYS:  ABC scale 06/12/23: 30%  TREATMENT DATE:  08/28/23  Pt arrived to therapy in transport chair.   Gait training 340ft, no AD, with skilled min A throughout with dual-task challenge of horizontal head turns in hallway to visually locate post-it notes - continues to have lateral instability throughout with slight scissoring of LEs, but improved balance with this today compared to last therapy session.  Dynamic stepping balance training of side stepping over 1/2 foam roll with 4 Colored Hedgehogs in 4 corners for pt to tap foot to on verbal command - requires consistent heavy min A for balance with a few times mod/max A to prevent posterior LOB.  Dynamic stepping balance challenge of forward/backwards stepping over 2x 1/2 foam rolls requires heavy mod A for balance throughout due to posterior LOB bias and pt  unable to perform adequate hip nor stepping strategy to regain balance without assistance.  Repeated sit<>stands from green chair with GTB resistance around hips x10reps for increased glute activation   Initiated resisted gait training against GTB resistance ~2-3steps each direction   Transitioned to resisted gait training using [employer] against resistance 2.5lb increased to 7.5lbs x3 reps each with pt having to control it pulling him backwards - requires skilled min A for guarding Side stepping against 2.5lb resistance with pt having greater difficulty walking out towards L with decreased ability to power through R LE to push himself towards L and then poor ability to keep wt shifted L to take large enough R lateral step to control being pulled back in  Pt continues to report feeling he can walk further prior to onset of R hip/buttocks pain.   PATIENT EDUCATION: Education details: PT POC, LTGs, findings on balance assessments today Person educated: Patient and Caregiver Angelica Education method: Explanation Education comprehension: verbalized understanding and needs further education  HOME EXERCISE PROGRAM:   Access Code: [email or web address] URL: [email or web address] Date: 06/28/2023 Prepared by: Carlen Chasten  Exercises - Walking  - 1 x daily - 7 x weekly - 2 sets - 3-5 minutes hold - Sit to Stand  - 1 x daily - 7 x weekly - 2 sets - 12-15 reps - Side Stepping with Resistance at Thighs and Counter Support  - 1 x daily - 7 x weekly - 3 sets - 10 reps - Supine Bridge with Resistance Band  - 1 x daily - 7 x weekly - 2 sets - 10-15 reps - Clamshell  - 1 x daily - 7 x weekly - 3 sets - 10 reps  GOALS: Goals reviewed with patient? Yes  SHORT TERM GOALS: Target date: 10/05/2023    Patient will be independent in home exercise program to improve strength/mobility for better functional independence with ADLs. Baseline: 06/20/2023: HEP provided 06/28/2023:  updated 08/21/2023: pt inconsistent with compliance  Goal status: ONGOING   2. Patient will report <2 falls over past 6 weeks to demonstrate improved safety awareness at home.  Baseline: 06/01/2023 = 10+ falls in past 6 months; 3/27: 10+ falls per patient report in past 6 weeks; 08/21/2023: at least 5+ falls in past 6 weeks Goal Status: ONGOING  LONG TERM GOALS: Target date: 11/16/2023    1.  Patient (> 44 years old) will complete five times sit to stand test in < 15 seconds indicating an increased LE strength and improved balance. Baseline: 06/01/23: 22 seconds with posterior LOB; 3/27: 11.51 seconds; 5/5: 12.17 seconds  Goal status: MET  2.  Patient will increase Berg Balance score by > 6 points to demonstrate decreased  fall risk during functional activities. Baseline: 06/01/23: 20/56; 3/27: 23/56; 5/5: 34/56  Goal status: ONGOING   3. Patient will reduce timed up and go to <11 seconds to reduce fall risk and demonstrate improved transfer/gait ability. Baseline: 06/12/23: 29.40 seconds using Up-Walker with CGA/min A; 3/27: 22.29 seconds with Up-walker and posterior LOB upon standing; 5/5: 16.13 seconds using Up-Walker with close SBA for safety Goal status: ONGOING  4.Patient will increase 10 meter walk test to >1.31m/s as to improve gait speed for better community ambulation and to reduce fall risk. Baseline: 06/01/23: 0.44m/s using Up-Walker; 3/27: 0.89 m/s using Up Walker, 5/5: 0.928 m/s using Up-Walker (after warm-up) Goal status: ONGOING  5.  Patient will require no more than supervision assist with ascending/descending 3 steps using handrails per home set-up.  Baseline: 06/12/23: requires min A using HR due to posterior lean/LOB especially when turning at the top; 3/27: minA required using HR due to posterior LOB on first step; 5/5: supervision using bilateral HRs and self-selecting reciprocal pattern (no LOB)  Goal status: ONGOING   ASSESSMENT:  CLINICAL IMPRESSION:    Patient  arrives to treatment session motivated to participate. Therapy session focused on dynamic gait training targeting stepping in variable directions over obstacles as well as resisted gait training using matrix cable machine. Patient continues to demonstrate posterior LOB bias as well as lateral hip instability with R hip being weaker than L resulting in more narrow BOS with impaired balance recovery strategies. Patient will benefit from continued strengthening and dynamic gait training to improve balance and decrease fall risk. Mr. Krol will benefit from continued skilled physical therapy to further address his balance deficits in order to decrease his fall risk and promote increased independence with functional mobility and ADLs.    OBJECTIVE IMPAIRMENTS: Abnormal gait, decreased activity tolerance, decreased balance, decreased cognition, decreased endurance, decreased knowledge of condition, decreased knowledge of use of DME, decreased mobility, difficulty walking, decreased strength, decreased safety awareness, hypomobility, impaired flexibility, impaired sensation, improper body mechanics, postural dysfunction, and pain.   ACTIVITY LIMITATIONS: carrying, lifting, bending, standing, squatting, sleeping, stairs, transfers, bed mobility, continence, bathing, toileting, dressing, reach over head, hygiene/grooming, and locomotion level  PARTICIPATION LIMITATIONS: meal prep, cleaning, laundry, medication management, driving, and community activity  PERSONAL FACTORS: Age, Sex, Time since onset of injury/illness/exacerbation, and 3+ comorbidities: Parkinson's, deep brain stim, depression, lipids, HTN, GERD, ED, dementia, seizures/falls, LBP/PSF, hx of lumbar fusion in 04/2022, hx of hip fx 08/2022, arthritis, frequent falls,  and OSA are also affecting patient's functional outcome.   REHAB POTENTIAL: Good  CLINICAL DECISION MAKING: Evolving/moderate complexity  EVALUATION COMPLEXITY:  Moderate  PLAN:  PT FREQUENCY: 1-2x/week  PT DURATION: 12 weeks  PLANNED INTERVENTIONS: 97164- PT Re-evaluation, 97110-Therapeutic exercises, 97530- Therapeutic activity, 97112- Neuromuscular re-education, 97535- Self Care, 96045- Manual therapy, [id number]- Gait training, [phone]- Orthotic Fit/training, [id number]- Electrical stimulation (manual), Patient/Family education, Balance training, Stair training, Taping, Joint mobilization, Joint manipulation, Spinal mobilization, Vestibular training, Cognitive remediation, DME instructions, Cryotherapy, and Moist heat  PLAN FOR NEXT SESSION:    *add dual-task challenges to interventions* Balance:   - posterior stepping strategy training with R posterolateral stepping (stepping over 1/2 foam roll with theraband resistance to external target)  - static standing balance with trunk rotation/reaching tasks (specifically posteriorly) Dynamic Gait without AD - increase intensity of gait training:  - backwards walking  - quick/sudden turning  - Head rotations   - resisted walking using matrix cable machine Strengthening:  - reinforce R hip and gluteal strengthening -  bridge with resistance - clamshells! (Or supine hooklying hip abduction) - strengthening hips to create wider BOS for improved balance during gait Stretching into anterior pelvic tilt Continue    Kendy Haston, PT, DPT, NCS, CSRS Physical Therapist - [place]  [facility]  2:04 PM 08/28/23

## 2023-08-29 ENCOUNTER — Ambulatory Visit: Payer: Medicare PPO | Admitting: Psychology

## 2023-08-31 ENCOUNTER — Ambulatory Visit: Payer: Medicare PPO

## 2023-09-04 ENCOUNTER — Ambulatory Visit: Payer: Medicare PPO | Admitting: Physical Therapy

## 2023-09-04 DIAGNOSIS — R269 Unspecified abnormalities of gait and mobility: Secondary | ICD-10-CM

## 2023-09-04 DIAGNOSIS — M6281 Muscle weakness (generalized): Secondary | ICD-10-CM | POA: Diagnosis not present

## 2023-09-04 DIAGNOSIS — R262 Difficulty in walking, not elsewhere classified: Secondary | ICD-10-CM

## 2023-09-04 DIAGNOSIS — R2681 Unsteadiness on feet: Secondary | ICD-10-CM

## 2023-09-04 DIAGNOSIS — R2689 Other abnormalities of gait and mobility: Secondary | ICD-10-CM | POA: Diagnosis not present

## 2023-09-04 DIAGNOSIS — R278 Other lack of coordination: Secondary | ICD-10-CM

## 2023-09-04 DIAGNOSIS — G20A1 Parkinson's disease without dyskinesia, without mention of fluctuations: Secondary | ICD-10-CM | POA: Diagnosis not present

## 2023-09-04 DIAGNOSIS — R296 Repeated falls: Secondary | ICD-10-CM

## 2023-09-04 NOTE — Therapy (Signed)
 OUTPATIENT PHYSICAL THERAPY NEURO TREATMENT   Patient Name: [name] MRN: [id number] DOB:March 22, 1951, 73 y.o., male Today's Date: 09/04/2023   PCP: Adelita Honer, PA-C REFERRING PROVIDER: Jacumin, Erika Leigh, PA   END OF SESSION:     PT End of Session - 09/04/23 1531     Visit Number 23    Number of Visits 24    Date for PT Re-Evaluation 11/16/23    Authorization Type [employer] [id number]    PT Start Time 1532    PT Stop Time 1618    PT Time Calculation (min) 46 min    Equipment Utilized During Treatment Gait belt    Activity Tolerance Patient tolerated treatment well    Behavior During Therapy WFL for tasks assessed/performed              Past Medical History:  Diagnosis Date   Anxiety disorder    Arthritis    Bradycardia    Cancer (HCC) 2013   skin cancer   Depression    ptsd   Dysrhythmia    chronic slow heart rate   GERD (gastroesophageal reflux disease)    H/O tachycardia-bradycardia syndrome    Headache(784.0)    tension headaches non recent   History of chicken pox    History of kidney stones    passed   Hypertension    treated with HCTZ   Major depressive disorder    Mild aortic regurgitation    Pacemaker    Parkinson's disease (HCC)    dx'ed 15 years ago   PTSD (post-traumatic stress disorder)    S/P deep brain stimulator placement    Shortness of breath dyspnea    Sleep apnea    doesn't use C-pap   Unspecified dementia, moderate, with psychotic disturbance (HCC)    Varicose veins    Past Surgical History:  Procedure Laterality Date   CATARACT EXTRACTION W/PHACO Right 01/17/2023   Procedure: CATARACT EXTRACTION PHACO AND INTRAOCULAR LENS PLACEMENT (IOC) RIGHT 6.33 00:42.6;  Surgeon: Clair Crews, MD;  Location: [facility];  Service: Ophthalmology;  Laterality: Right;   CATARACT EXTRACTION W/PHACO Left 02/07/2023   Procedure: CATARACT EXTRACTION PHACO AND INTRAOCULAR LENS PLACEMENT (IOC) LEFT 6.35 00:38.3;  Surgeon: Clair Crews, MD;  Location: [facility];  Service: Ophthalmology;  Laterality: Left;   CHOLECYSTECTOMY N/A 10/22/2014   Procedure: LAPAROSCOPIC CHOLECYSTECTOMY WITH INTRAOPERATIVE CHOLANGIOGRAM;  Surgeon: Rhina Center III, MD;  Location: [facility];  Service: General;  Laterality: N/A;   cyst removed      from lip as a child   INTRAMEDULLARY (IM) NAIL INTERTROCHANTERIC N/A 02/18/2019   Procedure: INTRAMEDULLARY (IM) NAIL INTERTROCHANTRIC, RIGHT,;  Surgeon: Rande Bushy, MD;  Location: [facility];  Service: Orthopedics;  Laterality: N/A;   LUMBAR LAMINECTOMY/DECOMPRESSION MICRODISCECTOMY Bilateral 12/14/2012   Procedure: Bilateral lumbar three-four, four-five decompressive laminotomy/foraminotomy;  Surgeon: Baruch Bosch, MD;  Location: [facility];  Service: Neurosurgery;  Laterality: Bilateral;   PULSE GENERATOR IMPLANT Bilateral 12/13/2013   Procedure: Bilateral implantable pulse generator placement;  Surgeon: Manya Sells, MD;  Location: [facility];  Service: Neurosurgery;  Laterality: Bilateral;  Bilateral implantable pulse generator placement   skin cancer removed     from ears,   12 lft arm  rt leg 15   SUBTHALAMIC STIMULATOR BATTERY REPLACEMENT Bilateral 07/14/2017   Procedure: BILATERAL IMPLANTED PULSE GENERATOR CHANGE FOR DEEP BRAIN STIMULATOR;  Surgeon: Manya Sells, MD;  Location: [facility];  Service: Neurosurgery;  Laterality: Bilateral;   SUBTHALAMIC STIMULATOR INSERTION Bilateral  12/06/2013   Procedure: SUBTHALAMIC STIMULATOR INSERTION;  Surgeon: Manya Sells, MD;  Location: [facility];  Service: Neurosurgery;  Laterality: Bilateral;  Bilateral deep brain stimulator placement   Patient Active Problem List   Diagnosis Date Noted   Hematuria 10/10/2021   Sinus drainage 12/09/2020   Medicare annual wellness visit, subsequent 11/18/2020   Pacemaker    Dementia associated with Parkinson's disease (HCC) 06/17/2020   Vertigo 02/19/2020   Urine abnormality 02/19/2020   Dysphagia  02/23/2019   Fall at home 10/18/2017   REM behavioral disorder 11/02/2016   Radicular pain in right arm 10/09/2016   GERD (gastroesophageal reflux disease) 07/31/2016   Colon cancer screening 07/23/2015   Gout 02/18/2015   Depression 01/06/2015   Skin lesion 10/02/2014   Dupuytren's contracture 08/20/2014   Cough 07/21/2014   Lumbar stenosis with neurogenic claudication 06/13/2014   S/P deep brain stimulator placement 05/08/2014   Advance care planning 01/19/2014   Parkinson's disease (HCC) 12/13/2013   PTSD (post-traumatic stress disorder) 06/13/2013   Erectile dysfunction 06/13/2013   HLD (hyperlipidemia) 06/13/2013   Essential hypertension 06/03/2013   Bradycardia by electrocardiogram 06/03/2013   Obstructive sleep apnea 03/12/2013    ONSET DATE: ~15 years ago when pt was diagnosed with Parkinson's  REFERRING DIAG: G20.A1 (ICD-10-CM) - Parkinsons disease (HCC)   THERAPY DIAG:  Abnormality of gait and mobility  Unsteadiness on feet  Difficulty in walking, not elsewhere classified  Muscle weakness (generalized)  Other lack of coordination  Repeated falls  Other abnormalities of gait and mobility  Rationale for Evaluation and Treatment: Rehabilitation  SUBJECTIVE:                                                                                                                                                                                             SUBJECTIVE STATEMENT:  Pt reports he is doing "OK." States he slept better last night. Reports he has had a little bit of pain in his R>L shoulders and in his low back, but not currently having a lot of pain.   Pt states he had a stumble and injured/scraped the back of his R hand, but denies any additional injuries. Pt currently wearing a bandage on back of R hand.  *Of note: pt requires increased time for verbal responses to therapist's questions and pt is easily distracted in busy environment  Pt accompanied by: self  and his wife (in another treatment)  PERTINENT HISTORY: Parkinson's, deep brain stim, depression, lipids, HTN, GERD, ED, dementia, seizures/falls, LBP/PSF, hx of lumbar fusion in 04/2022, hx of hip fx 08/2022, arthritis, frequent falls,  and OSA  PAIN:  Are you having pain? Yes: NPRS scale: 7/10 with activity below, but otherwise 3/10 at rest  Pain location: L calvicle Pain description: ache to sharp Aggravating factors: when going to push-up to stand or control himself when sitting using armrest Relieving factors: rest and support in arm sling  PRECAUTIONS: Fall and Other:   frequent falls, most recent on 05/22/2023 with ED visit, L superolateral clavicular fx (out of sling on 06/15/23 due to pt/family discontinuing it), deep brain stimulator  RED FLAGS: Bowel or bladder incontinence: Yes: "on occasion" has incontinence   WEIGHT BEARING RESTRICTIONS: Yes unsure of specific L UE WBing restrictions, but pt currently using an AD for ambulation, while wearing basic arm sling  FALLS: Has patient fallen in last 6 months? Yes. Number of falls 10+ most recent on 05/22/2023 with injury to L shoulder  LIVING ENVIRONMENT: Lives with: lives with their spouse (wife, Radio broadcast assistant) and has hired caregivers Lives in: House/apartment Stairs: Yes: External: 2-3 steps; bilateral but cannot reach both (due to current home renovations, this is the entrance he uses) primarily uses R HR Has following equipment at home: Wheelchair (manual), shower chair, Grab bars, and Up-Walker  PLOF: Independent with household mobility with device, Requires assistive device for independence, Needs assistance with ADLs, and meal prep  Pt reports he frequently leaves his AD to the side when going to get something, often forgetting it - he requires reinforced education on consistent use of U-step walker  PATIENT GOALS: Improve Balance  OBJECTIVE:  Note: Objective measures were completed at Evaluation unless otherwise  noted.  DIAGNOSTIC FINDINGS:   CT CERVICAL SPINE WITHOUT CONTRAST on 05/22/2023 IMPRESSION: Disc levels: Disc space narrowing and spurring in the lower cervical spine. Mild bilateral degenerative facet disease, left greater than right. Degenerative disc and facet disease throughout the cervical spine. No acute bony abnormality.  CT HEAD WITHOUT CONTRAST on 05/22/2023 FINDINGS: Brain: Deep brain stimulators are noted with the tips in the region of the thalami bilaterally, unchanged. Mild atrophy. No acute intracranial abnormality. Specifically, no hemorrhage, hydrocephalus, mass lesion, acute infarction, or significant intracranial injury. Vascular: No hyperdense vessel or unexpected calcification. Skull: No acute calvarial abnormality. Sinuses/Orbits: No acute findings Other: None IMPRESSION: No acute intracranial abnormality.  LEFT SHOULDER - 2+ VIEW on 05/22/2023 IMPRESSION: Acute mildly displaced fracture of the lateral aspect of the left clavicle with intra-articular extension.   COGNITION: Overall cognitive status: History of cognitive impairments - at baseline and pt with very delayed verbal responses to subjective questioning and noted to be easily distracted in the therapy gym environment   SENSATION: Light touch: Impaired  and decreased distally in B LEs, but specifically decreased throughout R LE compared to L LE   COORDINATION: Not formally assessed  EDEMA:  Not formally assessed, but no significant swelling on visual exam  MUSCLE TONE: WFL  MUSCLE LENGTH: Hamstrings: Right ____ deg; Left ___ deg  *not assessed, but would benefit from testing  DTRs:  Not formally assessed  POSTURE: rounded shoulders, forward head, decreased lumbar lordosis, and posterior pelvic tilt, posterior weight shift bias  LOWER EXTREMITY ROM:     Active  Not formally assessed Right Eval Left Eval  Hip flexion    Hip extension    Hip abduction    Hip adduction    Hip  internal rotation    Hip external rotation    Knee flexion    Knee extension    Ankle dorsiflexion Would benefit from testing Would benefit from testing  Ankle plantarflexion    Ankle inversion    Ankle eversion     (Blank rows = not tested)  LOWER EXTREMITY MMT:    MMT Right Eval Left Eval  Hip flexion 4 4  Hip extension    Hip abduction    Hip adduction    Hip internal rotation    Hip external rotation    Knee flexion 4+ 5  Knee extension 4+ 4+  Ankle dorsiflexion 4- 4  Ankle plantarflexion 4 4-  Ankle inversion    Ankle eversion    (Blank rows = not tested)  Manual Muscle Test Scale 0/5 = No muscle contraction can be seen or felt 1/5 = Contraction can be felt, but there is no motion 2-/5 = Part moves through incomplete ROM w/ gravity decreased 2/5 = Part moves through complete ROM w/ gravity decreased 2+/5 = Part moves through incomplete ROM (<50%) against gravity or through complete ROM w/ gravity 3-/5 = Part moves through incomplete ROM (>50%) against gravity 3/5 = Part moves through complete ROM against gravity 3+/5 = Part moves through complete ROM against gravity/slight resistance 4-/5= Holds test position against slight to moderate pressure 4/5 = Part moves through complete ROM against gravity/moderate resistance 4+/5= Holds test position against moderate to strong pressure 5/5 = Part moves through complete ROM against gravity/full resistance  BED MOBILITY:  Sit to supine Min A and Mod A Supine to sit Min A and Mod A Requires adjustable bed with bedrail at home   TRANSFERS: Assistive device utilized: Up-Walker  Sit to stand: SBA, CGA, and Min A Stand to sit: SBA, CGA, and Min A Chair to chair: CGA and Min A Floor: Not assessed **Requires variable assist levels for transfers due to imbalance  RAMP:  Level of Assistance:   Assistive device utilized:  Ramp Comments: Not formally assessed, may benefit from testing in future  CURB:  Level of  Assistance:  Assistive device utilized:  Curb Comments: Not formally assessed, may benefit from testing in future  STAIRS: Level of Assistance: 06/12/23: Min A Stair Negotiation Technique: Forwards with Bilateral Rails  Number of Stairs: 4  Height of Stairs: 6inch  Comments: 06/12/23 - posterior LOB at top of steps when turning around, minor posterior lean throughout  GAIT: Gait pattern: step through pattern, decreased step length- Right, decreased step length- Left, decreased stride length, knee flexed in stance- Right, knee flexed in stance- Left, shuffling, festinating, trunk flexed, wide BOS, poor foot clearance- Right, and poor foot clearance- Left Distance walked: ~165ft Assistive device utilized: Up-Walker Level of assistance: CGA and Min A Comments: Noticeable festination when turning, stepping back to sit, or side stepping  FUNCTIONAL TESTS:  5 times sit to stand: 22 seconds Timed up and go (TUG): 06/12/23: 29.40 seconds using Up-Walker with CGA/min A 10 meter walk test: 0.25m/s Berg Balance Scale: 20/56  PATIENT SURVEYS:  ABC scale 06/12/23: 30%  TREATMENT DATE:  09/04/23  Pt arrived to therapy in transport chair.   Gait training 183ft, no AD, with skilled min A throughout, continues to have lateral hip instability with slight scissoring of LEs Having increased R posterior hip pain today rated as 7/10   B LE functional strengthening for improved R hip strength to improve gait including: Supine hip abduction against RTB resistance x10 reps  Supine bridges with RTB resistance around knees increased to GTB resistance 3x 10 reps  Therapist providing external target for R knee to ensure R hip abductors activated fully L sidelying R LE clamshell against GTB resistance 2x 10 reps  Therapist again providing external target to achieve full ROM  Manual:   soft tissue mobilization to R gluteal region for 5 minutes as pt reports this improved his pain during prior sessions  Gait training 176ft, no AD, with pt reporting slight improvement in his pain to 6/10 - continued with above gait deviations although slightly improved with more normal BOS (less adduction/scissoring)  Side stepping, no UE support, with RTB resistance around knees down/back x3 reps ~78ft each direction - requires mod A for balance due to consistent posterior lean Continues to have decreased R lateral step length due to hip abductor weakness  Forward/backwards stepping over 1/2 foam roll, but pt with repeated posterior LOB when stepping back over target, requiring heavy mod/max A to maintain upright - x6 reps    Standing in // bars with B UE support performed standing R LE hip abduction against level 1 YTB resistance with therapist providing external target of "kicking" over a cone - x15 reps   Sit<>stands from EOM for 60sec with goal of performing as many as possible - provided external target to reach towards to promote increased anterior trunk lean with pt demoing excellent form with only close SBA/CGA for safety.    PATIENT EDUCATION: Education details: PT POC, LTGs, findings on balance assessments today Person educated: Patient and Caregiver Angelica Education method: Explanation Education comprehension: verbalized understanding and needs further education  HOME EXERCISE PROGRAM:   Access Code: [email or web address] URL: [email or web address] Date: 06/28/2023 Prepared by: Carlen Chasten  Exercises - Walking  - 1 x daily - 7 x weekly - 2 sets - 3-5 minutes hold - Sit to Stand  - 1 x daily - 7 x weekly - 2 sets - 12-15 reps - Side Stepping with Resistance at Thighs and Counter Support  - 1 x daily - 7 x weekly - 3 sets - 10 reps - Supine Bridge with Resistance Band  - 1 x daily - 7 x weekly - 2 sets - 10-15 reps - Clamshell  - 1 x daily - 7 x weekly - 3 sets - 10  reps  GOALS: Goals reviewed with patient? Yes  SHORT TERM GOALS: Target date: 10/05/2023    Patient will be independent in home exercise program to improve strength/mobility for better functional independence with ADLs. Baseline: 06/20/2023: HEP provided 06/28/2023: updated 08/21/2023: pt inconsistent with compliance  Goal status: ONGOING   2. Patient will report <2 falls over past 6 weeks to demonstrate improved safety awareness at home.  Baseline: 06/01/2023 = 10+ falls in past 6 months; 3/27: 10+ falls per patient report in past 6 weeks; 08/21/2023: at least 5+ falls in past 6 weeks Goal Status: ONGOING  LONG TERM GOALS: Target date: 11/16/2023    1.  Patient (> 1 years old) will complete five times sit to stand test in < 15 seconds indicating an  increased LE strength and improved balance. Baseline: 06/01/23: 22 seconds with posterior LOB; 3/27: 11.51 seconds; 5/5: 12.17 seconds  Goal status: MET  2.  Patient will increase Berg Balance score by > 6 points to demonstrate decreased fall risk during functional activities. Baseline: 06/01/23: 20/56; 3/27: 23/56; 5/5: 34/56  Goal status: ONGOING   3. Patient will reduce timed up and go to <11 seconds to reduce fall risk and demonstrate improved transfer/gait ability. Baseline: 06/12/23: 29.40 seconds using Up-Walker with CGA/min A; 3/27: 22.29 seconds with Up-walker and posterior LOB upon standing; 5/5: 16.13 seconds using Up-Walker with close SBA for safety Goal status: ONGOING  4.Patient will increase 10 meter walk test to >1.3m/s as to improve gait speed for better community ambulation and to reduce fall risk. Baseline: 06/01/23: 0.43m/s using Up-Walker; 3/27: 0.89 m/s using Up Walker, 5/5: 0.928 m/s using Up-Walker (after warm-up) Goal status: ONGOING  5.  Patient will require no more than supervision assist with ascending/descending 3 steps using handrails per home set-up.  Baseline: 06/12/23: requires min A using HR due to posterior  lean/LOB especially when turning at the top; 3/27: minA required using HR due to posterior LOB on first step; 5/5: supervision using bilateral HRs and self-selecting reciprocal pattern (no LOB)  Goal status: ONGOING   ASSESSMENT:  CLINICAL IMPRESSION:   Patient arrives to treatment session motivated to participate. Patient continues to demonstrate posterior LOB bias as well as lateral hip instability with R hip being weaker than L resulting in more narrow BOS with impaired balance recovery strategies. Patient reporting 6-7/10 R poster hip/buttocks pain during session today - performed manual therapy and targeted exercises to address R hip weakness that are contributing to pt's impaired gait mechanics. Patient with repeated posterior LOB today when stepping backwards over 1/2 foam roll. Patient will benefit from continued strengthening and dynamic gait training to improve balance and decrease fall risk. When pt's pain allows, pt would benefit from participation in higher intensity maybe circuit style exercises to increase HR. Mr. Saadeh will benefit from continued skilled physical therapy to further address his balance deficits in order to decrease his fall risk and promote increased independence with functional mobility and ADLs.    OBJECTIVE IMPAIRMENTS: Abnormal gait, decreased activity tolerance, decreased balance, decreased cognition, decreased endurance, decreased knowledge of condition, decreased knowledge of use of DME, decreased mobility, difficulty walking, decreased strength, decreased safety awareness, hypomobility, impaired flexibility, impaired sensation, improper body mechanics, postural dysfunction, and pain.   ACTIVITY LIMITATIONS: carrying, lifting, bending, standing, squatting, sleeping, stairs, transfers, bed mobility, continence, bathing, toileting, dressing, reach over head, hygiene/grooming, and locomotion level  PARTICIPATION LIMITATIONS: meal prep, cleaning, laundry, medication  management, driving, and community activity  PERSONAL FACTORS: Age, Sex, Time since onset of injury/illness/exacerbation, and 3+ comorbidities: Parkinson's, deep brain stim, depression, lipids, HTN, GERD, ED, dementia, seizures/falls, LBP/PSF, hx of lumbar fusion in 04/2022, hx of hip fx 08/2022, arthritis, frequent falls,  and OSA are also affecting patient's functional outcome.   REHAB POTENTIAL: Good  CLINICAL DECISION MAKING: Evolving/moderate complexity  EVALUATION COMPLEXITY: Moderate  PLAN:  PT FREQUENCY: 1-2x/week  PT DURATION: 12 weeks  PLANNED INTERVENTIONS: 97164- PT Re-evaluation, 97110-Therapeutic exercises, 97530- Therapeutic activity, 97112- Neuromuscular re-education, 97535- Self Care, 16109- Manual therapy, [phone]- Gait training, [phone]- Orthotic Fit/training, [phone]- Electrical stimulation (manual), Patient/Family education, Balance training, Stair training, Taping, Joint mobilization, Joint manipulation, Spinal mobilization, Vestibular training, Cognitive remediation, DME instructions, Cryotherapy, and Moist heat  PLAN FOR NEXT SESSION:    *add dual-task challenges  to interventions when appropriate* Balance:   - posterior stepping strategy training with R posterolateral stepping (stepping over 1/2 foam roll with theraband resistance to external target)  - static standing balance with trunk rotation/reaching tasks (specifically posteriorly) Dynamic Gait without AD - increase intensity of gait training:  - backwards walking  - quick/sudden turning  - Head rotations   - resisted walking using matrix cable machine Strengthening:  - reinforce R hip and gluteal strengthening - bridge with resistance - clamshells! (Or supine hooklying hip abduction) - strengthening hips to create wider BOS for improved balance during gait - continue R hip abduction in // bars Stretching into anterior pelvic tilt Continue    Ceri Mayer, PT, DPT, NCS, CSRS Physical Therapist - [facility]  4:34 PM 09/04/23

## 2023-09-07 ENCOUNTER — Ambulatory Visit: Payer: Medicare PPO | Admitting: Physical Therapy

## 2023-09-07 DIAGNOSIS — R2689 Other abnormalities of gait and mobility: Secondary | ICD-10-CM

## 2023-09-07 DIAGNOSIS — R278 Other lack of coordination: Secondary | ICD-10-CM

## 2023-09-07 DIAGNOSIS — M6281 Muscle weakness (generalized): Secondary | ICD-10-CM

## 2023-09-07 DIAGNOSIS — G20A1 Parkinson's disease without dyskinesia, without mention of fluctuations: Secondary | ICD-10-CM | POA: Diagnosis not present

## 2023-09-07 DIAGNOSIS — R296 Repeated falls: Secondary | ICD-10-CM | POA: Diagnosis not present

## 2023-09-07 DIAGNOSIS — R269 Unspecified abnormalities of gait and mobility: Secondary | ICD-10-CM | POA: Diagnosis not present

## 2023-09-07 DIAGNOSIS — R2681 Unsteadiness on feet: Secondary | ICD-10-CM

## 2023-09-07 DIAGNOSIS — R262 Difficulty in walking, not elsewhere classified: Secondary | ICD-10-CM

## 2023-09-07 NOTE — Therapy (Signed)
 OUTPATIENT PHYSICAL THERAPY NEURO TREATMENT   Patient Name: [name] MRN: [id number] DOB:1950/08/22, 73 y.o., male Today's Date: 09/07/2023   PCP: Adelita Honer, PA-C REFERRING PROVIDER: Jacumin, Erika Leigh, PA   END OF SESSION:     PT End of Session - 09/07/23 1147     Visit Number 24    Number of Visits 45    Date for PT Re-Evaluation 11/16/23    Authorization Type [employer] [id number]    PT Start Time 1147    PT Stop Time 1232    PT Time Calculation (min) 45 min    Equipment Utilized During Treatment Gait belt    Activity Tolerance Patient tolerated treatment well    Behavior During Therapy WFL for tasks assessed/performed               Past Medical History:  Diagnosis Date   Anxiety disorder    Arthritis    Bradycardia    Cancer (HCC) 2013   skin cancer   Depression    ptsd   Dysrhythmia    chronic slow heart rate   GERD (gastroesophageal reflux disease)    H/O tachycardia-bradycardia syndrome    Headache(784.0)    tension headaches non recent   History of chicken pox    History of kidney stones    passed   Hypertension    treated with HCTZ   Major depressive disorder    Mild aortic regurgitation    Pacemaker    Parkinson's disease (HCC)    dx'ed 15 years ago   PTSD (post-traumatic stress disorder)    S/P deep brain stimulator placement    Shortness of breath dyspnea    Sleep apnea    doesn't use C-pap   Unspecified dementia, moderate, with psychotic disturbance (HCC)    Varicose veins    Past Surgical History:  Procedure Laterality Date   CATARACT EXTRACTION W/PHACO Right 01/17/2023   Procedure: CATARACT EXTRACTION PHACO AND INTRAOCULAR LENS PLACEMENT (IOC) RIGHT 6.33 00:42.6;  Surgeon: Clair Crews, MD;  Location: [facility];  Service: Ophthalmology;  Laterality: Right;   CATARACT EXTRACTION W/PHACO Left 02/07/2023   Procedure: CATARACT EXTRACTION PHACO AND INTRAOCULAR LENS PLACEMENT (IOC) LEFT 6.35 00:38.3;  Surgeon:  Clair Crews, MD;  Location: [facility];  Service: Ophthalmology;  Laterality: Left;   CHOLECYSTECTOMY N/A 10/22/2014   Procedure: LAPAROSCOPIC CHOLECYSTECTOMY WITH INTRAOPERATIVE CHOLANGIOGRAM;  Surgeon: Rhina Center III, MD;  Location: [facility];  Service: General;  Laterality: N/A;   cyst removed      from lip as a child   INTRAMEDULLARY (IM) NAIL INTERTROCHANTERIC N/A 02/18/2019   Procedure: INTRAMEDULLARY (IM) NAIL INTERTROCHANTRIC, RIGHT,;  Surgeon: Rande Bushy, MD;  Location: [facility];  Service: Orthopedics;  Laterality: N/A;   LUMBAR LAMINECTOMY/DECOMPRESSION MICRODISCECTOMY Bilateral 12/14/2012   Procedure: Bilateral lumbar three-four, four-five decompressive laminotomy/foraminotomy;  Surgeon: Baruch Bosch, MD;  Location: [facility];  Service: Neurosurgery;  Laterality: Bilateral;   PULSE GENERATOR IMPLANT Bilateral 12/13/2013   Procedure: Bilateral implantable pulse generator placement;  Surgeon: Manya Sells, MD;  Location: [facility];  Service: Neurosurgery;  Laterality: Bilateral;  Bilateral implantable pulse generator placement   skin cancer removed     from ears,   12 lft arm  rt leg 15   SUBTHALAMIC STIMULATOR BATTERY REPLACEMENT Bilateral 07/14/2017   Procedure: BILATERAL IMPLANTED PULSE GENERATOR CHANGE FOR DEEP BRAIN STIMULATOR;  Surgeon: Manya Sells, MD;  Location: [facility];  Service: Neurosurgery;  Laterality: Bilateral;   SUBTHALAMIC STIMULATOR INSERTION  Bilateral 12/06/2013   Procedure: SUBTHALAMIC STIMULATOR INSERTION;  Surgeon: Manya Sells, MD;  Location: [facility];  Service: Neurosurgery;  Laterality: Bilateral;  Bilateral deep brain stimulator placement   Patient Active Problem List   Diagnosis Date Noted   Hematuria 10/10/2021   Sinus drainage 12/09/2020   Medicare annual wellness visit, subsequent 11/18/2020   Pacemaker    Dementia associated with Parkinson's disease (HCC) 06/17/2020   Vertigo 02/19/2020   Urine abnormality 02/19/2020   Dysphagia  02/23/2019   Fall at home 10/18/2017   REM behavioral disorder 11/02/2016   Radicular pain in right arm 10/09/2016   GERD (gastroesophageal reflux disease) 07/31/2016   Colon cancer screening 07/23/2015   Gout 02/18/2015   Depression 01/06/2015   Skin lesion 10/02/2014   Dupuytren's contracture 08/20/2014   Cough 07/21/2014   Lumbar stenosis with neurogenic claudication 06/13/2014   S/P deep brain stimulator placement 05/08/2014   Advance care planning 01/19/2014   Parkinson's disease (HCC) 12/13/2013   PTSD (post-traumatic stress disorder) 06/13/2013   Erectile dysfunction 06/13/2013   HLD (hyperlipidemia) 06/13/2013   Essential hypertension 06/03/2013   Bradycardia by electrocardiogram 06/03/2013   Obstructive sleep apnea 03/12/2013    ONSET DATE: ~15 years ago when pt was diagnosed with Parkinson's  REFERRING DIAG: G20.A1 (ICD-10-CM) - Parkinsons disease (HCC)   THERAPY DIAG:  Abnormality of gait and mobility  Unsteadiness on feet  Difficulty in walking, not elsewhere classified  Muscle weakness (generalized)  Other lack of coordination  Repeated falls  Other abnormalities of gait and mobility  Rationale for Evaluation and Treatment: Rehabilitation  SUBJECTIVE:                                                                                                                                                                                             SUBJECTIVE STATEMENT:  Pt states "I've been pretty good." [name] reports pt is sitting down firmly on toilet with poor eccentric control, which is how he scratched the back of his hand. Pt states he is still having trouble getting up and down from a chair at home. States it is only happening at home and pt unsure if they have an elevated toilet seat. Reports he can't recall exactly, but he may have had a fall since last session. Otherwise, no updates.  *Of note: pt requires increased time for verbal responses to  therapist's questions and pt is easily distracted in busy environment  Pt accompanied by: self and his wife (in another treatment) and caregiver in waiting area  PERTINENT HISTORY: Parkinson's, deep brain stim, depression, lipids, HTN, GERD, ED, dementia, seizures/falls, LBP/PSF, hx  of lumbar fusion in 04/2022, hx of hip fx 08/2022, arthritis, frequent falls,  and OSA   PAIN:  Are you having pain? Yes: NPRS scale: 7/10 with activity below, but otherwise 3/10 at rest  Pain location: L calvicle Pain description: ache to sharp Aggravating factors: when going to push-up to stand or control himself when sitting using armrest Relieving factors: rest and support in arm sling  PRECAUTIONS: Fall and Other:   frequent falls, most recent on 05/22/2023 with ED visit, L superolateral clavicular fx (out of sling on 06/15/23 due to pt/family discontinuing it), deep brain stimulator  RED FLAGS: Bowel or bladder incontinence: Yes: "on occasion" has incontinence   WEIGHT BEARING RESTRICTIONS: Yes unsure of specific L UE WBing restrictions, but pt currently using an AD for ambulation, while wearing basic arm sling  FALLS: Has patient fallen in last 6 months? Yes. Number of falls 10+ most recent on 05/22/2023 with injury to L shoulder  LIVING ENVIRONMENT: Lives with: lives with their spouse (wife, Radio broadcast assistant) and has hired caregivers Lives in: House/apartment Stairs: Yes: External: 2-3 steps; bilateral but cannot reach both (due to current home renovations, this is the entrance he uses) primarily uses R HR Has following equipment at home: Wheelchair (manual), shower chair, Grab bars, and Up-Walker  PLOF: Independent with household mobility with device, Requires assistive device for independence, Needs assistance with ADLs, and meal prep  Pt reports he frequently leaves his AD to the side when going to get something, often forgetting it - he requires reinforced education on consistent use of U-step walker  PATIENT  GOALS: Improve Balance  OBJECTIVE:  Note: Objective measures were completed at Evaluation unless otherwise noted.  DIAGNOSTIC FINDINGS:   CT CERVICAL SPINE WITHOUT CONTRAST on 05/22/2023 IMPRESSION: Disc levels: Disc space narrowing and spurring in the lower cervical spine. Mild bilateral degenerative facet disease, left greater than right. Degenerative disc and facet disease throughout the cervical spine. No acute bony abnormality.  CT HEAD WITHOUT CONTRAST on 05/22/2023 FINDINGS: Brain: Deep brain stimulators are noted with the tips in the region of the thalami bilaterally, unchanged. Mild atrophy. No acute intracranial abnormality. Specifically, no hemorrhage, hydrocephalus, mass lesion, acute infarction, or significant intracranial injury. Vascular: No hyperdense vessel or unexpected calcification. Skull: No acute calvarial abnormality. Sinuses/Orbits: No acute findings Other: None IMPRESSION: No acute intracranial abnormality.  LEFT SHOULDER - 2+ VIEW on 05/22/2023 IMPRESSION: Acute mildly displaced fracture of the lateral aspect of the left clavicle with intra-articular extension.   COGNITION: Overall cognitive status: History of cognitive impairments - at baseline and pt with very delayed verbal responses to subjective questioning and noted to be easily distracted in the therapy gym environment   SENSATION: Light touch: Impaired  and decreased distally in B LEs, but specifically decreased throughout R LE compared to L LE   COORDINATION: Not formally assessed  EDEMA:  Not formally assessed, but no significant swelling on visual exam  MUSCLE TONE: WFL  MUSCLE LENGTH: Hamstrings: Right ____ deg; Left ___ deg  *not assessed, but would benefit from testing  DTRs:  Not formally assessed  POSTURE: rounded shoulders, forward head, decreased lumbar lordosis, and posterior pelvic tilt, posterior weight shift bias  LOWER EXTREMITY ROM:     Active  Not  formally assessed Right Eval Left Eval  Hip flexion    Hip extension    Hip abduction    Hip adduction    Hip internal rotation    Hip external rotation    Knee flexion  Knee extension    Ankle dorsiflexion Would benefit from testing Would benefit from testing  Ankle plantarflexion    Ankle inversion    Ankle eversion     (Blank rows = not tested)  LOWER EXTREMITY MMT:    MMT Right Eval Left Eval  Hip flexion 4 4  Hip extension    Hip abduction    Hip adduction    Hip internal rotation    Hip external rotation    Knee flexion 4+ 5  Knee extension 4+ 4+  Ankle dorsiflexion 4- 4  Ankle plantarflexion 4 4-  Ankle inversion    Ankle eversion    (Blank rows = not tested)  Manual Muscle Test Scale 0/5 = No muscle contraction can be seen or felt 1/5 = Contraction can be felt, but there is no motion 2-/5 = Part moves through incomplete ROM w/ gravity decreased 2/5 = Part moves through complete ROM w/ gravity decreased 2+/5 = Part moves through incomplete ROM (<50%) against gravity or through complete ROM w/ gravity 3-/5 = Part moves through incomplete ROM (>50%) against gravity 3/5 = Part moves through complete ROM against gravity 3+/5 = Part moves through complete ROM against gravity/slight resistance 4-/5= Holds test position against slight to moderate pressure 4/5 = Part moves through complete ROM against gravity/moderate resistance 4+/5= Holds test position against moderate to strong pressure 5/5 = Part moves through complete ROM against gravity/full resistance  BED MOBILITY:  Sit to supine Min A and Mod A Supine to sit Min A and Mod A Requires adjustable bed with bedrail at home   TRANSFERS: Assistive device utilized: Up-Walker  Sit to stand: SBA, CGA, and Min A Stand to sit: SBA, CGA, and Min A Chair to chair: CGA and Min A Floor: Not assessed **Requires variable assist levels for transfers due to imbalance  RAMP:  Level of Assistance:   Assistive  device utilized:  Ramp Comments: Not formally assessed, may benefit from testing in future  CURB:  Level of Assistance:  Assistive device utilized:  Curb Comments: Not formally assessed, may benefit from testing in future  STAIRS: Level of Assistance: 06/12/23: Min A Stair Negotiation Technique: Forwards with Bilateral Rails  Number of Stairs: 4  Height of Stairs: 6inch  Comments: 06/12/23 - posterior LOB at top of steps when turning around, minor posterior lean throughout  GAIT: Gait pattern: step through pattern, decreased step length- Right, decreased step length- Left, decreased stride length, knee flexed in stance- Right, knee flexed in stance- Left, shuffling, festinating, trunk flexed, wide BOS, poor foot clearance- Right, and poor foot clearance- Left Distance walked: ~164ft Assistive device utilized: Up-Walker Level of assistance: CGA and Min A Comments: Noticeable festination when turning, stepping back to sit, or side stepping  FUNCTIONAL TESTS:  5 times sit to stand: 22 seconds Timed up and go (TUG): 06/12/23: 29.40 seconds using Up-Walker with CGA/min A 10 meter walk test: 0.63m/s Berg Balance Scale: 20/56  PATIENT SURVEYS:  ABC scale 06/12/23: 30%  TREATMENT DATE:  09/07/23  Pt arrived to therapy in transport chair.   Gait training 342ft, no AD, with skilled light min A throughout, continues to have lateral hip instability with slight scissoring of LEs, but not as significant today as in recent prior session No complaints of noticeable R hip pain today  Dynamic gait and transfer training with 3 chairs placed in an arc and pt having to walk forward/backwards and sideways between chairs and grab a cone as stated by therapist x10 reps with only light min A towards end - pt with noticeably improved balance today. Cues throughout to focus on  eccentric control when going to sit, which pt was successful with until becoming fatigued.  Dynamic gait and transfer training via side stepping between 2 chairs (~20ft apart) and having to turn 90degrees and sit in each chair with focus on turning fully and controlling eccentrically to sit - pt does have difficulty taking side steps without crossing his feet, but cuing for improvement - light min A for safety/balance throughout.   Repeated sit<>stand from green chair with "ball slams" when coming to stand using blue weighted ball (at least weighs 7lbs) x12 reps with goal not to have posterior LOB while standing after throwing ball down, but requires frequent heavy mod A to prevent posterior LOB.  Standing in // bars with B UE support performed standing LE hip abduction against level 1 YTB resistance with therapist providing external target of "kicking" over a cone to activate hip abductors - 2 x15 reps per LE  Gait training 129ft, no AD, out of therapy clinic with light min A and above stated gait deviations.     PATIENT EDUCATION: Education details: PT POC, LTGs, findings on balance assessments today Person educated: Patient and Caregiver Angelica Education method: Explanation Education comprehension: verbalized understanding and needs further education  HOME EXERCISE PROGRAM:   Access Code: [email or web address] URL: [email or web address] Date: 06/28/2023 Prepared by: Carlen Chasten  Exercises - Walking  - 1 x daily - 7 x weekly - 2 sets - 3-5 minutes hold - Sit to Stand  - 1 x daily - 7 x weekly - 2 sets - 12-15 reps - Side Stepping with Resistance at Thighs and Counter Support  - 1 x daily - 7 x weekly - 3 sets - 10 reps - Supine Bridge with Resistance Band  - 1 x daily - 7 x weekly - 2 sets - 10-15 reps - Clamshell  - 1 x daily - 7 x weekly - 3 sets - 10 reps  GOALS: Goals reviewed with patient? Yes  SHORT TERM GOALS: Target date: 10/05/2023    Patient will be independent in  home exercise program to improve strength/mobility for better functional independence with ADLs. Baseline: 06/20/2023: HEP provided 06/28/2023: updated 08/21/2023: pt inconsistent with compliance  Goal status: ONGOING   2. Patient will report <2 falls over past 6 weeks to demonstrate improved safety awareness at home.  Baseline: 06/01/2023 = 10+ falls in past 6 months; 3/27: 10+ falls per patient report in past 6 weeks; 08/21/2023: at least 5+ falls in past 6 weeks Goal Status: ONGOING  LONG TERM GOALS: Target date: 11/16/2023    1.  Patient (> 37 years old) will complete five times sit to stand test in < 15 seconds indicating an increased LE strength and improved balance. Baseline: 06/01/23: 22 seconds with posterior LOB; 3/27: 11.51 seconds; 5/5: 12.17 seconds  Goal status: MET  2.  Patient will increase Berg Balance score by >  6 points to demonstrate decreased fall risk during functional activities. Baseline: 06/01/23: 20/56; 3/27: 23/56; 5/5: 34/56  Goal status: ONGOING   3. Patient will reduce timed up and go to <11 seconds to reduce fall risk and demonstrate improved transfer/gait ability. Baseline: 06/12/23: 29.40 seconds using Up-Walker with CGA/min A; 3/27: 22.29 seconds with Up-walker and posterior LOB upon standing; 5/5: 16.13 seconds using Up-Walker with close SBA for safety Goal status: ONGOING  4.Patient will increase 10 meter walk test to >1.89m/s as to improve gait speed for better community ambulation and to reduce fall risk. Baseline: 06/01/23: 0.61m/s using Up-Walker; 3/27: 0.89 m/s using Up Walker, 5/5: 0.928 m/s using Up-Walker (after warm-up) Goal status: ONGOING  5.  Patient will require no more than supervision assist with ascending/descending 3 steps using handrails per home set-up.  Baseline: 06/12/23: requires min A using HR due to posterior lean/LOB especially when turning at the top; 3/27: minA required using HR due to posterior LOB on first step; 5/5: supervision  using bilateral HRs and self-selecting reciprocal pattern (no LOB)  Goal status: ONGOING   ASSESSMENT:  CLINICAL IMPRESSION:   Patient arrives to treatment session motivated to participate. Patient continues to demonstrate posterior LOB bias as well as lateral hip instability with R hip being weaker than L resulting in more narrow BOS with impaired balance recovery strategies. Patient overall having decreased R posterior hip pain today with resultant improved functional mobility. This allowing participation in further dynamic gait in variable directions and focus on eccentric control during stand>sit transfers. Patient continues to have greatest difficulty with lateral side stepping due to hip weakness, which impacts his balance when turning or navigating obstacles. Patient also continues to have posterior LOB bias during sit>stand transfers. Patient will benefit from continued strengthening and dynamic gait training to improve balance and decrease fall risk. When pt's pain allows, pt would benefit from participation in higher intensity maybe circuit style exercises to increase HR. Mr. Nuttall will benefit from continued skilled physical therapy to further address his balance deficits in order to decrease his fall risk and promote increased independence with functional mobility and ADLs.    OBJECTIVE IMPAIRMENTS: Abnormal gait, decreased activity tolerance, decreased balance, decreased cognition, decreased endurance, decreased knowledge of condition, decreased knowledge of use of DME, decreased mobility, difficulty walking, decreased strength, decreased safety awareness, hypomobility, impaired flexibility, impaired sensation, improper body mechanics, postural dysfunction, and pain.   ACTIVITY LIMITATIONS: carrying, lifting, bending, standing, squatting, sleeping, stairs, transfers, bed mobility, continence, bathing, toileting, dressing, reach over head, hygiene/grooming, and locomotion  level  PARTICIPATION LIMITATIONS: meal prep, cleaning, laundry, medication management, driving, and community activity  PERSONAL FACTORS: Age, Sex, Time since onset of injury/illness/exacerbation, and 3+ comorbidities: Parkinson's, deep brain stim, depression, lipids, HTN, GERD, ED, dementia, seizures/falls, LBP/PSF, hx of lumbar fusion in 04/2022, hx of hip fx 08/2022, arthritis, frequent falls,  and OSA are also affecting patient's functional outcome.   REHAB POTENTIAL: Good  CLINICAL DECISION MAKING: Evolving/moderate complexity  EVALUATION COMPLEXITY: Moderate  PLAN:  PT FREQUENCY: 1-2x/week  PT DURATION: 12 weeks  PLANNED INTERVENTIONS: 97164- PT Re-evaluation, 97110-Therapeutic exercises, 97530- Therapeutic activity, 97112- Neuromuscular re-education, 97535- Self Care, 86578- Manual therapy, [phone]- Gait training, [phone]- Orthotic Fit/training, [phone]- Electrical stimulation (manual), Patient/Family education, Balance training, Stair training, Taping, Joint mobilization, Joint manipulation, Spinal mobilization, Vestibular training, Cognitive remediation, DME instructions, Cryotherapy, and Moist heat  PLAN FOR NEXT SESSION:    *add dual-task challenges to interventions when appropriate* Balance:   - posterior stepping strategy  training with R posterolateral stepping (stepping over 1/2 foam roll with theraband resistance to external target)  - static standing balance with trunk rotation/reaching tasks (specifically posteriorly) Dynamic Gait without AD - increase intensity of gait training:  - backwards walking  - quick/sudden turning  - Head rotations   - resisted walking using matrix cable machine Strengthening:  - reinforce R hip and gluteal strengthening - bridge with resistance - clamshells! (Or supine hooklying hip abduction) - strengthening hips to create wider BOS for improved balance during gait - continue R hip abduction in // bars Stretching into anterior pelvic  tilt Continue    Samanthia Howland, PT, DPT, NCS, CSRS Physical Therapist - [place]  [facility]  1:06 PM 09/07/23

## 2023-09-14 ENCOUNTER — Ambulatory Visit: Payer: Medicare PPO

## 2023-09-14 DIAGNOSIS — R296 Repeated falls: Secondary | ICD-10-CM

## 2023-09-14 DIAGNOSIS — R262 Difficulty in walking, not elsewhere classified: Secondary | ICD-10-CM | POA: Diagnosis not present

## 2023-09-14 DIAGNOSIS — R2689 Other abnormalities of gait and mobility: Secondary | ICD-10-CM | POA: Diagnosis not present

## 2023-09-14 DIAGNOSIS — R2681 Unsteadiness on feet: Secondary | ICD-10-CM | POA: Diagnosis not present

## 2023-09-14 DIAGNOSIS — R269 Unspecified abnormalities of gait and mobility: Secondary | ICD-10-CM

## 2023-09-14 DIAGNOSIS — G20A1 Parkinson's disease without dyskinesia, without mention of fluctuations: Secondary | ICD-10-CM

## 2023-09-14 DIAGNOSIS — M6281 Muscle weakness (generalized): Secondary | ICD-10-CM

## 2023-09-14 DIAGNOSIS — R278 Other lack of coordination: Secondary | ICD-10-CM | POA: Diagnosis not present

## 2023-09-14 NOTE — Therapy (Signed)
 OUTPATIENT PHYSICAL THERAPY TREATMENT   Patient Name: [name] MRN: [id number] DOB:May 29, 1950, 73 y.o., male Today's Date: 09/14/2023   PCP: Adelita Honer, PA-C REFERRING PROVIDER: Jacumin, Erika Leigh, PA   END OF SESSION:     PT End of Session - 09/14/23 1158     Visit Number 25    Number of Visits 45    Date for PT Re-Evaluation 11/16/23    Authorization Type [employer] 2025    Authorization Time Period Cert through 05/23/22    Progress Note Due on Visit 30    PT Start Time 1150    PT Stop Time 1230    PT Time Calculation (min) 40 min    Equipment Utilized During Treatment Gait belt    Activity Tolerance Patient tolerated treatment well    Behavior During Therapy WFL for tasks assessed/performed               Past Medical History:  Diagnosis Date   Anxiety disorder    Arthritis    Bradycardia    Cancer (HCC) 2013   skin cancer   Depression    ptsd   Dysrhythmia    chronic slow heart rate   GERD (gastroesophageal reflux disease)    H/O tachycardia-bradycardia syndrome    Headache(784.0)    tension headaches non recent   History of chicken pox    History of kidney stones    passed   Hypertension    treated with HCTZ   Major depressive disorder    Mild aortic regurgitation    Pacemaker    Parkinson's disease (HCC)    dx'ed 15 years ago   PTSD (post-traumatic stress disorder)    S/P deep brain stimulator placement    Shortness of breath dyspnea    Sleep apnea    doesn't use C-pap   Unspecified dementia, moderate, with psychotic disturbance (HCC)    Varicose veins    Past Surgical History:  Procedure Laterality Date   CATARACT EXTRACTION W/PHACO Right 01/17/2023   Procedure: CATARACT EXTRACTION PHACO AND INTRAOCULAR LENS PLACEMENT (IOC) RIGHT 6.33 00:42.6;  Surgeon: Clair Crews, MD;  Location: [facility];  Service: Ophthalmology;  Laterality: Right;   CATARACT EXTRACTION W/PHACO Left 02/07/2023   Procedure: CATARACT EXTRACTION  PHACO AND INTRAOCULAR LENS PLACEMENT (IOC) LEFT 6.35 00:38.3;  Surgeon: Clair Crews, MD;  Location: [facility];  Service: Ophthalmology;  Laterality: Left;   CHOLECYSTECTOMY N/A 10/22/2014   Procedure: LAPAROSCOPIC CHOLECYSTECTOMY WITH INTRAOPERATIVE CHOLANGIOGRAM;  Surgeon: Rhina Center III, MD;  Location: [facility];  Service: General;  Laterality: N/A;   cyst removed      from lip as a child   INTRAMEDULLARY (IM) NAIL INTERTROCHANTERIC N/A 02/18/2019   Procedure: INTRAMEDULLARY (IM) NAIL INTERTROCHANTRIC, RIGHT,;  Surgeon: Rande Bushy, MD;  Location: [facility];  Service: Orthopedics;  Laterality: N/A;   LUMBAR LAMINECTOMY/DECOMPRESSION MICRODISCECTOMY Bilateral 12/14/2012   Procedure: Bilateral lumbar three-four, four-five decompressive laminotomy/foraminotomy;  Surgeon: Baruch Bosch, MD;  Location: [facility];  Service: Neurosurgery;  Laterality: Bilateral;   PULSE GENERATOR IMPLANT Bilateral 12/13/2013   Procedure: Bilateral implantable pulse generator placement;  Surgeon: Manya Sells, MD;  Location: [facility];  Service: Neurosurgery;  Laterality: Bilateral;  Bilateral implantable pulse generator placement   skin cancer removed     from ears,   12 lft arm  rt leg 15   SUBTHALAMIC STIMULATOR BATTERY REPLACEMENT Bilateral 07/14/2017   Procedure: BILATERAL IMPLANTED PULSE GENERATOR CHANGE FOR DEEP BRAIN STIMULATOR;  Surgeon: Nigel Bart,  Drexel Gentles, MD;  Location: [facility];  Service: Neurosurgery;  Laterality: Bilateral;   SUBTHALAMIC STIMULATOR INSERTION Bilateral 12/06/2013   Procedure: SUBTHALAMIC STIMULATOR INSERTION;  Surgeon: Manya Sells, MD;  Location: [facility];  Service: Neurosurgery;  Laterality: Bilateral;  Bilateral deep brain stimulator placement   Patient Active Problem List   Diagnosis Date Noted   Hematuria 10/10/2021   Sinus drainage 12/09/2020   Medicare annual wellness visit, subsequent 11/18/2020   Pacemaker    Dementia associated with Parkinson's disease (HCC)  06/17/2020   Vertigo 02/19/2020   Urine abnormality 02/19/2020   Dysphagia 02/23/2019   Fall at home 10/18/2017   REM behavioral disorder 11/02/2016   Radicular pain in right arm 10/09/2016   GERD (gastroesophageal reflux disease) 07/31/2016   Colon cancer screening 07/23/2015   Gout 02/18/2015   Depression 01/06/2015   Skin lesion 10/02/2014   Dupuytren's contracture 08/20/2014   Cough 07/21/2014   Lumbar stenosis with neurogenic claudication 06/13/2014   S/P deep brain stimulator placement 05/08/2014   Advance care planning 01/19/2014   Parkinson's disease (HCC) 12/13/2013   PTSD (post-traumatic stress disorder) 06/13/2013   Erectile dysfunction 06/13/2013   HLD (hyperlipidemia) 06/13/2013   Essential hypertension 06/03/2013   Bradycardia by electrocardiogram 06/03/2013   Obstructive sleep apnea 03/12/2013    ONSET DATE: ~15 years ago when pt was diagnosed with Parkinson's  REFERRING DIAG: G20.A1 (ICD-10-CM) - Parkinsons disease (HCC)   THERAPY DIAG:  Abnormality of gait and mobility  Unsteadiness on feet  Difficulty in walking, not elsewhere classified  Muscle weakness (generalized)  Other lack of coordination  Repeated falls  Parkinson's disease without dyskinesia or fluctuating manifestations (HCC)  Rationale for Evaluation and Treatment: Rehabilitation  SUBJECTIVE:                                                                                                                                                                                             SUBJECTIVE STATEMENT: Pt denies any recent updates. Still has bandaid dressing over scrape on hand. Pt says he feels good today.   PERTINENT HISTORY: Parkinson's s/p deep brain stim, depression, HTN, GERD, clavicular fracture 2025, dementia, seizures/falls, LBP/PSF, hx of lumbar fusion in 04/2022, hx of hip fx 08/2022, arthritis, frequent falls, and OSA   PAIN:  Are you having pain? No  PRECAUTIONS: Fall and  Other:   frequent falls, most recent on 05/22/2023 with ED visit, L superolateral clavicular fx (out of sling on 06/15/23 due to pt/family discontinuing it), deep brain stimulator   WEIGHT BEARING RESTRICTIONS: Unknown   FALLS: Has patient fallen in last 6 months? Yes. Number of falls 10+  LIVING ENVIRONMENT: Lives with: lives with their spouse (wife, Corbett Desanctis) and has hired caregivers Lives in: House/apartment Stairs: Yes: External: 2-3 steps; bilateral but cannot reach both (due to current home renovations, this is the entrance he uses) primarily uses R HR Has following equipment at home: Wheelchair (manual), shower chair, Grab bars, and Up-Walker  PLOF: Independent with household mobility with device, Requires assistive device for independence, Needs assistance with ADLs, and meal prep  Pt reports he frequently leaves his AD to the side when going to get something, often forgetting it - he requires reinforced education on consistent use of U-step walker  PATIENT GOALS: Improve Balance  OBJECTIVE:  Note: Objective measures were completed at Evaluation unless otherwise noted.  DIAGNOSTIC FINDINGS:   CT CERVICAL SPINE WITHOUT CONTRAST on 05/22/2023 IMPRESSION: Disc levels: Disc space narrowing and spurring in the lower cervical spine. Mild bilateral degenerative facet disease, left greater than right. Degenerative disc and facet disease throughout the cervical spine. No acute bony abnormality.  CT HEAD WITHOUT CONTRAST on 05/22/2023 FINDINGS: Brain: Deep brain stimulators are noted with the tips in the region of the thalami bilaterally, unchanged. Mild atrophy. No acute intracranial abnormality. Specifically, no hemorrhage, hydrocephalus, mass lesion, acute infarction, or significant intracranial injury. Vascular: No hyperdense vessel or unexpected calcification. Skull: No acute calvarial abnormality. Sinuses/Orbits: No acute findings Other: None IMPRESSION: No acute  intracranial abnormality.  LEFT SHOULDER - 2+ VIEW on 05/22/2023 IMPRESSION: Acute mildly displaced fracture of the lateral aspect of the left clavicle with intra-articular extension.  COGNITION: Overall cognitive status: History of cognitive impairments - at baseline and pt with very delayed verbal responses to subjective questioning and noted to be easily distracted in the therapy gym environment  LOWER EXTREMITY MMT:    MMT Right Eval Left Eval  Hip flexion 4 4  Hip extension    Hip abduction    Hip adduction    Hip internal rotation    Hip external rotation    Knee flexion 4+ 5  Knee extension 4+ 4+  Ankle dorsiflexion 4- 4  Ankle plantarflexion 4 4-  Ankle inversion    Ankle eversion    (Blank rows = not tested)  BED MOBILITY:  Sit to supine Min A and Mod A Supine to sit Min A and Mod A Requires adjustable bed with bedrail at home   TRANSFERS: Assistive device utilized: Up-Walker  Sit to stand: SBA, CGA, and Min A Stand to sit: SBA, CGA, and Min A Chair to chair: CGA and Min A Floor: Not assessed  STAIRS: Level of Assistance: 06/12/23: Min A Stair Negotiation Technique: Forwards with Bilateral Rails  Number of Stairs: 4  Height of Stairs: 6inch  Comments: 06/12/23 - posterior LOB at top of steps when turning around, minor posterior lean throughout  GAIT: Gait pattern: step through pattern, decreased step length- Right, decreased step length- Left, decreased stride length, knee flexed in stance- Right, knee flexed in stance- Left, shuffling, festinating, trunk flexed, wide BOS, poor foot clearance- Right, and poor foot clearance- Left Distance walked: ~188ft Assistive device utilized: Up-Walker Level of assistance: CGA and Min A Comments: Noticeable festination when turning, stepping back to sit, or side stepping  FUNCTIONAL TESTS:  5 times sit to stand: 22 seconds Timed up and go (TUG): 06/12/23: 29.40 seconds using Up-Walker with CGA/min A 10 meter walk  test: 0.48m/s Berg Balance Scale: 20/56  PATIENT SURVEYS:  ABC scale 06/12/23: 30%  TREATMENT DATE:  09/14/23  Seated vitals assessment -STS from chair x10 -with 5lb AW bilat, alternate FWD/backward AMB in // bars minGuard assist 8x -5xSTS with 10lb med ball and med ball toss (minGuard assist)  -with 5lb AW bilat, alternate Rt/Left stepping in // bars minGuard assist 4x -5xSTS with 10lb med ball and med ball toss (minGuard assist)  -9ft overground with AMB with minGuard ot minA, no device, return to sitting x60 seconds -22ft overground with AMB with minGuard ot minA, no device return to sitting x60 seconds -30ft overground with AMB with minGuard ot minA, no device return to sitting x60 seconds -seated on dynadisc battle rope pull in 51ft x40lbs x2 (minA for balance)  -standing battle rope pull in 44ft x27lbs x3 (minA for balance)   PATIENT EDUCATION: Education details: PT POC, LTGs, findings on balance assessments today Person educated: Patient and Caregiver Angelica Education method: Explanation Education comprehension: verbalized understanding and needs further education  HOME EXERCISE PROGRAM:   Access Code: [email or web address] URL: [email or web address] Date: 06/28/2023 Prepared by: Carlen Chasten  Exercises - Walking  - 1 x daily - 7 x weekly - 2 sets - 3-5 minutes hold - Sit to Stand  - 1 x daily - 7 x weekly - 2 sets - 12-15 reps - Side Stepping with Resistance at Thighs and Counter Support  - 1 x daily - 7 x weekly - 3 sets - 10 reps - Supine Bridge with Resistance Band  - 1 x daily - 7 x weekly - 2 sets - 10-15 reps - Clamshell  - 1 x daily - 7 x weekly - 3 sets - 10 reps  GOALS: Goals reviewed with patient? Yes  SHORT TERM GOALS: Target date: 10/05/2023   Patient will be independent in home exercise program to improve strength/mobility for  better functional independence with ADLs. Baseline: 06/20/2023: HEP provided 06/28/2023: updated 08/21/2023: pt inconsistent with compliance  Goal status: ONGOING   2. Patient will report <2 falls over past 6 weeks to demonstrate improved safety awareness at home.  Baseline: 06/01/2023 = 10+ falls in past 6 months; 3/27: 10+ falls per patient report in past 6 weeks; 08/21/2023: at least 5+ falls in past 6 weeks Goal Status: ONGOING  LONG TERM GOALS: Target date: 11/16/2023  1.  Patient (> 53 years old) will complete five times sit to stand test in < 15 seconds indicating an increased LE strength and improved balance. Baseline: 06/01/23: 22 seconds with posterior LOB; 3/27: 11.51 seconds; 5/5: 12.17 seconds  Goal status: MET  2.  Patient will increase Berg Balance score by > 6 points to demonstrate decreased fall risk during functional activities. Baseline: 06/01/23: 20/56; 3/27: 23/56; 5/5: 34/56 Goal status: ONGOING   3. Patient will reduce timed up and go to <11 seconds to reduce fall risk and demonstrate improved transfer/gait ability. Baseline: 06/12/23: 29.40 seconds using Up-Walker with CGA/min A; 3/27: 22.29 seconds with Up-walker and posterior LOB upon standing; 5/5: 16.13 seconds using Up-Walker with close SBA for safety Goal status: ONGOING  4.Patient will increase 10 meter walk test to >1.44m/s as to improve gait speed for better community ambulation and to reduce fall risk. Baseline: 06/01/23: 0.92m/s using Up-Walker; 3/27: 0.89 m/s using Up Walker, 5/5: 0.928 m/s using Up-Walker (after warm-up) Goal status: ONGOING  5.  Patient will require no more than supervision assist with ascending/descending 3 steps using handrails per home set-up.  Baseline: 06/12/23: requires min A using HR due to posterior lean/LOB especially when turning at  the top; 3/27: minA required using HR due to posterior LOB on first step; 5/5: supervision using bilateral HRs and self-selecting reciprocal pattern (no  LOB)  Goal status: ONGOING   ASSESSMENT:  CLINICAL IMPRESSION:   Conitnued with POC, interventions aimed at dynamic postural control, large amplitude movements. Pt requires intermittinet Mina balance due ot ataxia. Pt shows improved postural control and motor planning when performing a loaded task, however LOB into chair >25% of transfers STS. Patient will benefit from continued skilled physical therapy to further address his balance deficits in order to decrease his fall risk and promote increased independence with functional mobility and ADLs.    OBJECTIVE IMPAIRMENTS: Abnormal gait, decreased activity tolerance, decreased balance, decreased cognition, decreased endurance, decreased knowledge of condition, decreased knowledge of use of DME, decreased mobility, difficulty walking, decreased strength, decreased safety awareness, hypomobility, impaired flexibility, impaired sensation, improper body mechanics, postural dysfunction, and pain.   ACTIVITY LIMITATIONS: carrying, lifting, bending, standing, squatting, sleeping, stairs, transfers, bed mobility, continence, bathing, toileting, dressing, reach over head, hygiene/grooming, and locomotion level  PARTICIPATION LIMITATIONS: meal prep, cleaning, laundry, medication management, driving, and community activity  PERSONAL FACTORS: Age, Sex, Time since onset of injury/illness/exacerbation, and 3+ comorbidities: Parkinson's, deep brain stim, depression, lipids, HTN, GERD, ED, dementia, seizures/falls, LBP/PSF, hx of lumbar fusion in 04/2022, hx of hip fx 08/2022, arthritis, frequent falls,  and OSA are also affecting patient's functional outcome.   REHAB POTENTIAL: Good  CLINICAL DECISION MAKING: Evolving/moderate complexity  EVALUATION COMPLEXITY: Moderate  PLAN:  PT FREQUENCY: 1-2x/week  PT DURATION: 12 weeks  PLANNED INTERVENTIONS: 97164- PT Re-evaluation, 97110-Therapeutic exercises, 97530- Therapeutic activity, 97112- Neuromuscular  re-education, 97535- Self Care, 16109- Manual therapy, [phone]- Gait training, [phone]- Orthotic Fit/training, [phone]- Electrical stimulation (manual), Patient/Family education, Balance training, Stair training, Taping, Joint mobilization, Joint manipulation, Spinal mobilization, Vestibular training, Cognitive remediation, DME instructions, Cryotherapy, and Moist heat  PLAN FOR NEXT SESSION:    *add dual-task challenges to interventions when appropriate* Balance:   - posterior stepping strategy training with R posterolateral stepping (stepping over 1/2 foam roll with theraband resistance to external target)  - static standing balance with trunk rotation/reaching tasks (specifically posteriorly) Dynamic Gait without AD - increase intensity of gait training:  - backwards walking  - quick/sudden turning  - Head rotations   - resisted walking using matrix cable machine Strengthening:  - reinforce R hip and gluteal strengthening - bridge with resistance - clamshells! (Or supine hooklying hip abduction) - strengthening hips to create wider BOS for improved balance during gait - continue R hip abduction in // bars Stretching into anterior pelvic tilt   12:09 PM, 09/14/23 Dawn Eth, PT, DPT Physical Therapist - [facility]  [phone])   12:08 PM 09/14/23

## 2023-09-15 ENCOUNTER — Ambulatory Visit: Admitting: Psychology

## 2023-09-15 DIAGNOSIS — F331 Major depressive disorder, recurrent, moderate: Secondary | ICD-10-CM | POA: Diagnosis not present

## 2023-09-15 DIAGNOSIS — F4323 Adjustment disorder with mixed anxiety and depressed mood: Secondary | ICD-10-CM | POA: Diagnosis not present

## 2023-09-15 NOTE — Progress Notes (Signed)
 [facility] Counselor/Therapist Progress Note  Patient ID: [name], MRN: [id number],    Date: 09/15/2023  Time Spent: 49 minutes  Time in:  10:05  Time out:  10:54  Treatment Type: Indivdual  Reported Symptoms: sadness, and boredom  Mental Status Exam: Appearance:  Casual     Behavior: normal  Motor: Psychomotor Retardation  Speech/Language:  Slow  Affect: flat  Mood: pleasant  Thought process: concrete  Thought content:   WNL  Sensory/Perceptual disturbances:   WNL  Orientation: oriented to person, place, time/date, and situation  Attention: Fair  Concentration: Fair  Memory: Immediate;   Fair  [employer] of knowledge:  Fair  Insight:   Fair  Judgment:  Fair  Impulse Control: Fair   Risk Assessment: Danger to Self:  No Self-injurious Behavior: No Danger to Others: No Duty to Warn:no Physical Aggression / Violence:No  Access to Firearms a concern: No  Gang Involvement:No   Subjective: The patient attended a face-to-face individual therapy session  via video visit today.  The patient gave verbal consent for the video to be on Caregility and his wife is aware of limitations of telehealth.  The patient was in his home  and therapist was in the office.  The patient presents with a flat affect and mood is pleasant.  The patient and his wife report that he is going to [place]  today with a group of men to have a retreat.  She is going to go [place] to see her grandbaby.  They both seem very happy about doing these things.  The patient's wife reports that he has been more resistant to listening to what they are saying about safety concerns.  We had a talk about that and I explained to him that if he continues to be resistant and is things that they are not comfortable with that it jeopardizes them as well as him as far as health.  The patient is sad because he cannot do what he used to do and we talked about that as well.  The patient seemed to  understand that he needs to change his attitude about when people ask him not to do things because of his health.  Interventions: Ego-Supportive, CBT, Social skills training, problem solving  Diagnosis:Major depressive disorder, recurrent episode, moderate (HCC)  Adjustment disorder with mixed anxiety and depressed mood  Plan: Client Abilities/Strengths  Pt is bright and has a good sense of humor.  Client Treatment Preferences  Individual therapy.  Client Statement of Needs  improve coping skills regarding chronic illness.  Treatment Level  Outpatient Individual therapy  Symptoms  Depressed or irritable mood.: No Description Entered (Status: improved). Diminished interest in or  enjoyment of activities.: No Description Entered (Status: maintained). Feelings of hopelessness,  worthlessness, or inappropriate guilt.: No Description Entered (Status: improved). Lack of energy.: No  Description Entered (Status: maintained). Psychomotor agitation or retardation.: No Description  Entered (Status: maintained). Social withdrawal.: No Description Entered (Status: improved).  Problems Addressed  Unipolar Depression, Unipolar Depression, Unipolar Depression, Unipolar Depression  Goals 1. Alleviate depressive symptoms and return to previous level of effective  functioning. 2. Develop healthy interpersonal relationships that lead to the alleviation  and help prevent the relapse of depression. Objective Describe current and past experiences with depression including their impact on functioning and  attempts to resolve it. Target Date: 2024-04-30 Frequency: Monthly Progress: 60 Modality: individual Related Interventions 1. Encourage the client to share his/her thoughts and feelings of depression; express  empathy and  build rapport while identifying primary cognitive, behavioral, interpersonal, or other  contributors to depression. Objective Learn and implement behavioral strategies to overcome  depression. Target Date: 2024-04-30 Frequency: Monthly Progress: 80 Modality: individual Objective Identify and replace thoughts and beliefs that support depression. Target Date: 2024-04-30 Frequency: Monthly Progress: 70 Modality: individual Related Interventions 1. Conduct Cognitive-Behavioral Therapy (see Cognitive Behavior Therapy by Kerrie Peek; Overcoming Depression by Duke Gibbons al.), beginning with helping the client learn the connection among  cognition, depressive feelings, and actions. 2. Explore and restructure underlying assumptions and beliefs reflected in biased self-talk that  may put the client at risk for relapse or recurrence. 3. Facilitate and reinforce the client's shift from biased depressive self-talk and beliefs to realitybased cognitive messages that enhance self-confidence and increase adaptive actions (see  "Positive Self-Talk" in the Adult Psychotherapy Homework Planner by Beacher Bottoms). Objective Learn and implement problem-solving and decision-making skills. Target Date: 2024-04-30 Frequency: Monthly Progress: 70 Modality: individual Related Interventions 1. Encourage in the client the development of a positive problem orientation in which problems  and solving them are viewed as a natural part of life and not something to be feared, despaired,  or avoided. 2. Conduct Problem-Solving Therapy (see Problem-Solving Therapy by Donnice Gale and Nezu) using techniques such as psychoeducation, modeling, and role-playing to teach client problem-solving skills (i.e., defining a problem specifically, generating possible solutions, evaluating the pros  and cons of each solution, selecting and implementing a plan of action, evaluating the efficacy  of the plan, accepting or revising the plan); role-play application of the problem-solving skill to  a real life issue (or assign "Applying Problem-Solving to Interpersonal Conflict" in the Adult  Psychotherapy Homework Planner by  Beacher Bottoms). 3. Develop healthy thinking patterns and beliefs about self, others, and the world that lead to the alleviation and help prevent the relapse of  depression. 4. Recognize, accept, and cope with feelings of depression. Diagnosis Axis  none F43.21 (Adjustment Disorder, With depressed mood) - Open -  [Signifier: n/a]  Adjustment Disorder,  With Depressed Mood  Axis  none 296.32 (Major depressive affective disorder, recurrent episode,  moderate) - Open - [Signifier: n/a]  Conditions For Discharge Achievement of treatment goals and objectives   Plan is to continue to offer supportive therapy to patient to help him deal with his depression related to his Parkinsons diagnosis.  Patient approved Treatment Plan   Mera Gunkel Octavio Ben, LCSW

## 2023-09-18 ENCOUNTER — Ambulatory Visit: Payer: Medicare PPO | Attending: Physician Assistant | Admitting: Physical Therapy

## 2023-09-18 DIAGNOSIS — R278 Other lack of coordination: Secondary | ICD-10-CM | POA: Insufficient documentation

## 2023-09-18 DIAGNOSIS — M6281 Muscle weakness (generalized): Secondary | ICD-10-CM | POA: Diagnosis not present

## 2023-09-18 DIAGNOSIS — R296 Repeated falls: Secondary | ICD-10-CM | POA: Insufficient documentation

## 2023-09-18 DIAGNOSIS — R269 Unspecified abnormalities of gait and mobility: Secondary | ICD-10-CM | POA: Insufficient documentation

## 2023-09-18 DIAGNOSIS — R262 Difficulty in walking, not elsewhere classified: Secondary | ICD-10-CM | POA: Insufficient documentation

## 2023-09-18 DIAGNOSIS — R2681 Unsteadiness on feet: Secondary | ICD-10-CM | POA: Diagnosis not present

## 2023-09-18 DIAGNOSIS — G20A1 Parkinson's disease without dyskinesia, without mention of fluctuations: Secondary | ICD-10-CM | POA: Diagnosis not present

## 2023-09-18 DIAGNOSIS — R2689 Other abnormalities of gait and mobility: Secondary | ICD-10-CM | POA: Insufficient documentation

## 2023-09-18 NOTE — Therapy (Signed)
 OUTPATIENT PHYSICAL THERAPY TREATMENT   Patient Name: [name] MRN: [id number] DOB:[birth year], 73 y.o., male Today's Date: 09/18/2023   PCP: Adelita Honer, PA-C REFERRING PROVIDER: Jacumin, Erika Leigh, PA   END OF SESSION:     PT End of Session - 09/18/23 1100     Visit Number 26    Number of Visits 45    Date for PT Re-Evaluation 11/16/23    Authorization Type [employer] 2025    Authorization Time Period Cert through 05/23/22    Progress Note Due on Visit 30    PT Start Time 1102    PT Stop Time 1146    PT Time Calculation (min) 44 min    Equipment Utilized During Treatment Gait belt    Activity Tolerance Patient tolerated treatment well    Behavior During Therapy WFL for tasks assessed/performed                Past Medical History:  Diagnosis Date   Anxiety disorder    Arthritis    Bradycardia    Cancer (HCC) 2013   skin cancer   Depression    ptsd   Dysrhythmia    chronic slow heart rate   GERD (gastroesophageal reflux disease)    H/O tachycardia-bradycardia syndrome    Headache(784.0)    tension headaches non recent   History of chicken pox    History of kidney stones    passed   Hypertension    treated with HCTZ   Major depressive disorder    Mild aortic regurgitation    Pacemaker    Parkinson's disease (HCC)    dx'ed 15 years ago   PTSD (post-traumatic stress disorder)    S/P deep brain stimulator placement    Shortness of breath dyspnea    Sleep apnea    doesn't use C-pap   Unspecified dementia, moderate, with psychotic disturbance (HCC)    Varicose veins    Past Surgical History:  Procedure Laterality Date   CATARACT EXTRACTION W/PHACO Right 01/17/2023   Procedure: CATARACT EXTRACTION PHACO AND INTRAOCULAR LENS PLACEMENT (IOC) RIGHT 6.33 00:42.6;  Surgeon: Clair Crews, MD;  Location: [facility];  Service: Ophthalmology;  Laterality: Right;   CATARACT EXTRACTION W/PHACO Left 02/07/2023   Procedure: CATARACT EXTRACTION  PHACO AND INTRAOCULAR LENS PLACEMENT (IOC) LEFT 6.35 00:38.3;  Surgeon: Clair Crews, MD;  Location: [facility];  Service: Ophthalmology;  Laterality: Left;   CHOLECYSTECTOMY N/A 10/22/2014   Procedure: LAPAROSCOPIC CHOLECYSTECTOMY WITH INTRAOPERATIVE CHOLANGIOGRAM;  Surgeon: Rhina Center III, MD;  Location: [facility];  Service: General;  Laterality: N/A;   cyst removed      from lip as a child   INTRAMEDULLARY (IM) NAIL INTERTROCHANTERIC N/A 02/18/2019   Procedure: INTRAMEDULLARY (IM) NAIL INTERTROCHANTRIC, RIGHT,;  Surgeon: Rande Bushy, MD;  Location: [facility];  Service: Orthopedics;  Laterality: N/A;   LUMBAR LAMINECTOMY/DECOMPRESSION MICRODISCECTOMY Bilateral 12/14/2012   Procedure: Bilateral lumbar three-four, four-five decompressive laminotomy/foraminotomy;  Surgeon: Baruch Bosch, MD;  Location: [facility];  Service: Neurosurgery;  Laterality: Bilateral;   PULSE GENERATOR IMPLANT Bilateral 12/13/2013   Procedure: Bilateral implantable pulse generator placement;  Surgeon: Manya Sells, MD;  Location: [facility];  Service: Neurosurgery;  Laterality: Bilateral;  Bilateral implantable pulse generator placement   skin cancer removed     from ears,   12 lft arm  rt leg 15   SUBTHALAMIC STIMULATOR BATTERY REPLACEMENT Bilateral 07/14/2017   Procedure: BILATERAL IMPLANTED PULSE GENERATOR CHANGE FOR DEEP BRAIN STIMULATOR;  Surgeon:  Manya Sells, MD;  Location: [facility];  Service: Neurosurgery;  Laterality: Bilateral;   SUBTHALAMIC STIMULATOR INSERTION Bilateral 12/06/2013   Procedure: SUBTHALAMIC STIMULATOR INSERTION;  Surgeon: Manya Sells, MD;  Location: [facility];  Service: Neurosurgery;  Laterality: Bilateral;  Bilateral deep brain stimulator placement   Patient Active Problem List   Diagnosis Date Noted   Hematuria 10/10/2021   Sinus drainage 12/09/2020   Medicare annual wellness visit, subsequent 11/18/2020   Pacemaker    Dementia associated with Parkinson's disease (HCC)  06/17/2020   Vertigo 02/19/2020   Urine abnormality 02/19/2020   Dysphagia 02/23/2019   Fall at home 10/18/2017   REM behavioral disorder 11/02/2016   Radicular pain in right arm 10/09/2016   GERD (gastroesophageal reflux disease) 07/31/2016   Colon cancer screening 07/23/2015   Gout 02/18/2015   Depression 01/06/2015   Skin lesion 10/02/2014   Dupuytren's contracture 08/20/2014   Cough 07/21/2014   Lumbar stenosis with neurogenic claudication 06/13/2014   S/P deep brain stimulator placement 05/08/2014   Advance care planning 01/19/2014   Parkinson's disease (HCC) 12/13/2013   PTSD (post-traumatic stress disorder) 06/13/2013   Erectile dysfunction 06/13/2013   HLD (hyperlipidemia) 06/13/2013   Essential hypertension 06/03/2013   Bradycardia by electrocardiogram 06/03/2013   Obstructive sleep apnea 03/12/2013    ONSET DATE: ~15 years ago when pt was diagnosed with Parkinson's  REFERRING DIAG: G20.A1 (ICD-10-CM) - Parkinsons disease (HCC)   THERAPY DIAG:  Abnormality of gait and mobility  Unsteadiness on feet  Difficulty in walking, not elsewhere classified  Muscle weakness (generalized)  Other lack of coordination  Repeated falls  Other abnormalities of gait and mobility  Rationale for Evaluation and Treatment: Rehabilitation  SUBJECTIVE:                                                                                                                                                                                             SUBJECTIVE STATEMENT:  Pt reports he feels like he broke his toe on L foot in his sleep last night because it is really sore this morning. Denies any other updates. Denies pain to start session; however, continues to have R buttocks pain during gait without AD in session.   PERTINENT HISTORY: Parkinson's s/p deep brain stim, depression, HTN, GERD, clavicular fracture 2025, dementia, seizures/falls, LBP/PSF, hx of lumbar fusion in 04/2022, hx of  hip fx 08/2022, arthritis, frequent falls, and OSA   PAIN:  Are you having pain? No  PRECAUTIONS: Fall and Other:   frequent falls, most recent on 05/22/2023 with ED visit, L superolateral clavicular fx (out of sling on 06/15/23 due to  pt/family discontinuing it), deep brain stimulator   WEIGHT BEARING RESTRICTIONS: Unknown   FALLS: Has patient fallen in last 6 months? Yes. Number of falls 10+   LIVING ENVIRONMENT: Lives with: lives with their spouse (wife, Corbett Desanctis) and has hired caregivers Lives in: House/apartment Stairs: Yes: External: 2-3 steps; bilateral but cannot reach both (due to current home renovations, this is the entrance he uses) primarily uses R HR Has following equipment at home: Wheelchair (manual), shower chair, Grab bars, and Up-Walker  PLOF: Independent with household mobility with device, Requires assistive device for independence, Needs assistance with ADLs, and meal prep  Pt reports he frequently leaves his AD to the side when going to get something, often forgetting it - he requires reinforced education on consistent use of U-step walker  PATIENT GOALS: Improve Balance  OBJECTIVE:  Note: Objective measures were completed at Evaluation unless otherwise noted.  DIAGNOSTIC FINDINGS:   CT CERVICAL SPINE WITHOUT CONTRAST on 05/22/2023 IMPRESSION: Disc levels: Disc space narrowing and spurring in the lower cervical spine. Mild bilateral degenerative facet disease, left greater than right. Degenerative disc and facet disease throughout the cervical spine. No acute bony abnormality.  CT HEAD WITHOUT CONTRAST on 05/22/2023 FINDINGS: Brain: Deep brain stimulators are noted with the tips in the region of the thalami bilaterally, unchanged. Mild atrophy. No acute intracranial abnormality. Specifically, no hemorrhage, hydrocephalus, mass lesion, acute infarction, or significant intracranial injury. Vascular: No hyperdense vessel or unexpected calcification. Skull:  No acute calvarial abnormality. Sinuses/Orbits: No acute findings Other: None IMPRESSION: No acute intracranial abnormality.  LEFT SHOULDER - 2+ VIEW on 05/22/2023 IMPRESSION: Acute mildly displaced fracture of the lateral aspect of the left clavicle with intra-articular extension.  COGNITION: Overall cognitive status: History of cognitive impairments - at baseline and pt with very delayed verbal responses to subjective questioning and noted to be easily distracted in the therapy gym environment  LOWER EXTREMITY MMT:    MMT Right Eval Left Eval  Hip flexion 4 4  Hip extension    Hip abduction    Hip adduction    Hip internal rotation    Hip external rotation    Knee flexion 4+ 5  Knee extension 4+ 4+  Ankle dorsiflexion 4- 4  Ankle plantarflexion 4 4-  Ankle inversion    Ankle eversion    (Blank rows = not tested)  BED MOBILITY:  Sit to supine Min A and Mod A Supine to sit Min A and Mod A Requires adjustable bed with bedrail at home   TRANSFERS: Assistive device utilized: Up-Walker  Sit to stand: SBA, CGA, and Min A Stand to sit: SBA, CGA, and Min A Chair to chair: CGA and Min A Floor: Not assessed  STAIRS: Level of Assistance: 06/12/23: Min A Stair Negotiation Technique: Forwards with Bilateral Rails  Number of Stairs: 4  Height of Stairs: 6inch  Comments: 06/12/23 - posterior LOB at top of steps when turning around, minor posterior lean throughout  GAIT: Gait pattern: step through pattern, decreased step length- Right, decreased step length- Left, decreased stride length, knee flexed in stance- Right, knee flexed in stance- Left, shuffling, festinating, trunk flexed, wide BOS, poor foot clearance- Right, and poor foot clearance- Left Distance walked: ~12ft Assistive device utilized: Up-Walker Level of assistance: CGA and Min A Comments: Noticeable festination when turning, stepping back to sit, or side stepping  FUNCTIONAL TESTS:  5 times sit to stand: 22  seconds Timed up and go (TUG): 06/12/23: 29.40 seconds using Up-Walker with CGA/min A  10 meter walk test: 0.6m/s Berg Balance Scale: 20/56  PATIENT SURVEYS:  ABC scale 06/12/23: 30%                                                                                                                             TREATMENT DATE:  09/18/23  Doffed his shoes and socks to assess L toe with only a light redness/pink in the 5th phalanges, but no tenderness along the 5th metatarsal bone and no other signs of bruising or injury. 5th metatarsal was in good alignment and pt able to move and abduct it without significant increase in soreness. Educated pt to continue to monitor it and the pain today.   B LE functional strengthening to increase pt's HR via 3 rounds of 60sec sit<>stands, no UE support, with only 20sec rest break between Cuing for increased anterior trunk lean when rising to avoid posterior LOB in standing that causes him to return to sitting uncontrollably Attempted to provide RTB resistance around hips while rising but this was too challenge as pt fatiguing  Denies pain, but states this makes him "winded"   Standing R LE NMR and strengthening including:  In // bars with B UE support performed hip abduction against YTB resistance while kicking to external target to increase motor plan to activate correct muscle 2x20 reps per LE  Cuing to ensure R hip abductor activated during stance to prevent hip drop while performing L LE hip abduction  Gait training, no AD, 166ft with light min A - continues to have some hip instability with R hip drop during stance and narrow BOS that will occasionally cause him to cross his feet.  Therapist reinforced education to pt on need to use his Up-Walker for all ambulation when not in therapy.  Dynamic gait training using cable machine including:  Forward against 7.5lb resistance with controlled backwards walking Down/back x2 reps Min A with 1x heavy mod A due  to poor eccentric control when walking backwards Side stepping in each direction against 2.5lb resistance  down/back x2reps Requires heavy min/light mod A throughout for balance due to poor control when stepping laterally Noticeable R hip weakness when lateral stepping in both directions  Dynamic standing balance intervention with 4 external targets on tables - pt having to rotate trunk while holding 5lb dumbbell in both hands to tap the targets on verbal command while maintaining balance Able to maintain balance for ~30sec-40min until posterior LOB requiring mod A to maintain upright Once pt starts to have posterior LOB and begins to fatigue, he has difficulty reorienting and maintaining midline   PATIENT EDUCATION: Education details: PT POC, LTGs, findings on balance assessments today Person educated: Patient and Caregiver Angelica Education method: Explanation Education comprehension: verbalized understanding and needs further education  HOME EXERCISE PROGRAM:   Access Code: [email or web address] URL: [email or web address] Date: 06/28/2023 Prepared by: Carlen Chasten  Exercises - Walking  - 1 x daily - 7 x weekly - 2 sets -  3-5 minutes hold - Sit to Stand  - 1 x daily - 7 x weekly - 2 sets - 12-15 reps - Side Stepping with Resistance at Thighs and Counter Support  - 1 x daily - 7 x weekly - 3 sets - 10 reps - Supine Bridge with Resistance Band  - 1 x daily - 7 x weekly - 2 sets - 10-15 reps - Clamshell  - 1 x daily - 7 x weekly - 3 sets - 10 reps  GOALS: Goals reviewed with patient? Yes  SHORT TERM GOALS: Target date: 10/05/2023   Patient will be independent in home exercise program to improve strength/mobility for better functional independence with ADLs. Baseline: 06/20/2023: HEP provided 06/28/2023: updated 08/21/2023: pt inconsistent with compliance  Goal status: ONGOING   2. Patient will report <2 falls over past 6 weeks to demonstrate improved safety awareness at home.   Baseline: 06/01/2023 = 10+ falls in past 6 months; 3/27: 10+ falls per patient report in past 6 weeks; 08/21/2023: at least 5+ falls in past 6 weeks Goal Status: ONGOING  LONG TERM GOALS: Target date: 11/16/2023  1.  Patient (> 81 years old) will complete five times sit to stand test in < 15 seconds indicating an increased LE strength and improved balance. Baseline: 06/01/23: 22 seconds with posterior LOB; 3/27: 11.51 seconds; 5/5: 12.17 seconds  Goal status: MET  2.  Patient will increase Berg Balance score by > 6 points to demonstrate decreased fall risk during functional activities. Baseline: 06/01/23: 20/56; 3/27: 23/56; 5/5: 34/56 Goal status: ONGOING   3. Patient will reduce timed up and go to <11 seconds to reduce fall risk and demonstrate improved transfer/gait ability. Baseline: 06/12/23: 29.40 seconds using Up-Walker with CGA/min A; 3/27: 22.29 seconds with Up-walker and posterior LOB upon standing; 5/5: 16.13 seconds using Up-Walker with close SBA for safety Goal status: ONGOING  4.Patient will increase 10 meter walk test to >1.51m/s as to improve gait speed for better community ambulation and to reduce fall risk. Baseline: 06/01/23: 0.17m/s using Up-Walker; 3/27: 0.89 m/s using Up Walker, 5/5: 0.928 m/s using Up-Walker (after warm-up) Goal status: ONGOING  5.  Patient will require no more than supervision assist with ascending/descending 3 steps using handrails per home set-up.  Baseline: 06/12/23: requires min A using HR due to posterior lean/LOB especially when turning at the top; 3/27: minA required using HR due to posterior LOB on first step; 5/5: supervision using bilateral HRs and self-selecting reciprocal pattern (no LOB)  Goal status: ONGOING   ASSESSMENT:  CLINICAL IMPRESSION:   Therapy session focused on functional B LE strength to improve transfers and hip stability during standing and ambulatory tasks. Patient continues to have R>L hip instability during gait which causes  lateral balance instability as well as posterior lean/LOB bias during sit<>stand transfers and when performing standing tasks. Patient continues to remain highly motivated to participate and progress interventions. With fatigue, pt's deficits become more prominent and he has difficulty reorienting himself to midline and maintaining standing balance. Patient will benefit from continued skilled physical therapy to further address his balance deficits in order to decrease his fall risk and promote increased independence with functional mobility and ADLs.    OBJECTIVE IMPAIRMENTS: Abnormal gait, decreased activity tolerance, decreased balance, decreased cognition, decreased endurance, decreased knowledge of condition, decreased knowledge of use of DME, decreased mobility, difficulty walking, decreased strength, decreased safety awareness, hypomobility, impaired flexibility, impaired sensation, improper body mechanics, postural dysfunction, and pain.   ACTIVITY LIMITATIONS: carrying,  lifting, bending, standing, squatting, sleeping, stairs, transfers, bed mobility, continence, bathing, toileting, dressing, reach over head, hygiene/grooming, and locomotion level  PARTICIPATION LIMITATIONS: meal prep, cleaning, laundry, medication management, driving, and community activity  PERSONAL FACTORS: Age, Sex, Time since onset of injury/illness/exacerbation, and 3+ comorbidities: Parkinson's, deep brain stim, depression, lipids, HTN, GERD, ED, dementia, seizures/falls, LBP/PSF, hx of lumbar fusion in 04/2022, hx of hip fx 08/2022, arthritis, frequent falls,  and OSA are also affecting patient's functional outcome.   REHAB POTENTIAL: Good  CLINICAL DECISION MAKING: Evolving/moderate complexity  EVALUATION COMPLEXITY: Moderate  PLAN:  PT FREQUENCY: 1-2x/week  PT DURATION: 12 weeks  PLANNED INTERVENTIONS: 97164- PT Re-evaluation, 97110-Therapeutic exercises, 97530- Therapeutic activity, 97112- Neuromuscular  re-education, 97535- Self Care, 47829- Manual therapy, [phone]- Gait training, [phone]- Orthotic Fit/training, [phone]- Electrical stimulation (manual), Patient/Family education, Balance training, Stair training, Taping, Joint mobilization, Joint manipulation, Spinal mobilization, Vestibular training, Cognitive remediation, DME instructions, Cryotherapy, and Moist heat  PLAN FOR NEXT SESSION:     *add dual-task challenges to interventions when appropriate* Balance:   - posterior stepping strategy training with R posterolateral stepping (stepping over 1/2 foam roll with theraband resistance to external target)  - static standing balance with trunk rotation/reaching tasks (specifically posteriorly) Dynamic Gait without AD - increase intensity of gait training:  - backwards walking  - quick/sudden turning  - Head rotations   - resisted walking using matrix cable machine Strengthening:  - reinforce R hip and gluteal strengthening - bridge with resistance - clamshells! (Or supine hooklying hip abduction) - strengthening hips to create wider BOS for improved balance during gait - continue R hip abduction in // bars Stretching into anterior pelvic tilt    Tyronica Truxillo, PT, DPT, NCS, CSRS Physical Therapist - [facility]  [facility]  11:48 AM 09/18/23

## 2023-09-21 ENCOUNTER — Ambulatory Visit: Payer: Medicare PPO | Admitting: Physical Therapy

## 2023-09-25 ENCOUNTER — Ambulatory Visit: Payer: Medicare PPO | Admitting: Physical Therapy

## 2023-09-28 ENCOUNTER — Ambulatory Visit: Payer: Medicare PPO | Admitting: Physical Therapy

## 2023-09-28 DIAGNOSIS — R262 Difficulty in walking, not elsewhere classified: Secondary | ICD-10-CM

## 2023-09-28 DIAGNOSIS — R269 Unspecified abnormalities of gait and mobility: Secondary | ICD-10-CM | POA: Diagnosis not present

## 2023-09-28 DIAGNOSIS — M6281 Muscle weakness (generalized): Secondary | ICD-10-CM

## 2023-09-28 DIAGNOSIS — R296 Repeated falls: Secondary | ICD-10-CM

## 2023-09-28 DIAGNOSIS — R2689 Other abnormalities of gait and mobility: Secondary | ICD-10-CM | POA: Diagnosis not present

## 2023-09-28 DIAGNOSIS — G20A1 Parkinson's disease without dyskinesia, without mention of fluctuations: Secondary | ICD-10-CM | POA: Diagnosis not present

## 2023-09-28 DIAGNOSIS — R278 Other lack of coordination: Secondary | ICD-10-CM

## 2023-09-28 DIAGNOSIS — R2681 Unsteadiness on feet: Secondary | ICD-10-CM | POA: Diagnosis not present

## 2023-09-28 NOTE — Therapy (Signed)
 OUTPATIENT PHYSICAL THERAPY TREATMENT   Patient Name: [name] MRN: [id number] DOB:05-28-1950, 73 y.o., male Today's Date: 09/28/2023   PCP: Adelita Honer, PA-C REFERRING PROVIDER: Jacumin, Erika Leigh, PA    END OF SESSION:     PT End of Session - 09/28/23 1149     Visit Number 27    Number of Visits 45    Date for PT Re-Evaluation 11/16/23    Authorization Type [employer] 2025    Authorization Time Period Cert through 05/23/22    Progress Note Due on Visit 30    PT Start Time 1148    PT Stop Time 1235    PT Time Calculation (min) 47 min    Equipment Utilized During Treatment Gait belt    Activity Tolerance Patient tolerated treatment well    Behavior During Therapy WFL for tasks assessed/performed           Past Medical History:  Diagnosis Date   Anxiety disorder    Arthritis    Bradycardia    Cancer (HCC) 2013   skin cancer   Depression    ptsd   Dysrhythmia    chronic slow heart rate   GERD (gastroesophageal reflux disease)    H/O tachycardia-bradycardia syndrome    Headache(784.0)    tension headaches non recent   History of chicken pox    History of kidney stones    passed   Hypertension    treated with HCTZ   Major depressive disorder    Mild aortic regurgitation    Pacemaker    Parkinson's disease (HCC)    dx'ed 15 years ago   PTSD (post-traumatic stress disorder)    S/P deep brain stimulator placement    Shortness of breath dyspnea    Sleep apnea    doesn't use C-pap   Unspecified dementia, moderate, with psychotic disturbance (HCC)    Varicose veins    Past Surgical History:  Procedure Laterality Date   CATARACT EXTRACTION W/PHACO Right 01/17/2023   Procedure: CATARACT EXTRACTION PHACO AND INTRAOCULAR LENS PLACEMENT (IOC) RIGHT 6.33 00:42.6;  Surgeon: Clair Crews, MD;  Location: [facility];  Service: Ophthalmology;  Laterality: Right;   CATARACT EXTRACTION W/PHACO Left 02/07/2023   Procedure: CATARACT EXTRACTION PHACO  AND INTRAOCULAR LENS PLACEMENT (IOC) LEFT 6.35 00:38.3;  Surgeon: Clair Crews, MD;  Location: [facility];  Service: Ophthalmology;  Laterality: Left;   CHOLECYSTECTOMY N/A 10/22/2014   Procedure: LAPAROSCOPIC CHOLECYSTECTOMY WITH INTRAOPERATIVE CHOLANGIOGRAM;  Surgeon: Rhina Center III, MD;  Location: [facility];  Service: General;  Laterality: N/A;   cyst removed      from lip as a child   INTRAMEDULLARY (IM) NAIL INTERTROCHANTERIC N/A 02/18/2019   Procedure: INTRAMEDULLARY (IM) NAIL INTERTROCHANTRIC, RIGHT,;  Surgeon: Rande Bushy, MD;  Location: [facility];  Service: Orthopedics;  Laterality: N/A;   LUMBAR LAMINECTOMY/DECOMPRESSION MICRODISCECTOMY Bilateral 12/14/2012   Procedure: Bilateral lumbar three-four, four-five decompressive laminotomy/foraminotomy;  Surgeon: Baruch Bosch, MD;  Location: [facility];  Service: Neurosurgery;  Laterality: Bilateral;   PULSE GENERATOR IMPLANT Bilateral 12/13/2013   Procedure: Bilateral implantable pulse generator placement;  Surgeon: Manya Sells, MD;  Location: [facility];  Service: Neurosurgery;  Laterality: Bilateral;  Bilateral implantable pulse generator placement   skin cancer removed     from ears,   12 lft arm  rt leg 15   SUBTHALAMIC STIMULATOR BATTERY REPLACEMENT Bilateral 07/14/2017   Procedure: BILATERAL IMPLANTED PULSE GENERATOR CHANGE FOR DEEP BRAIN STIMULATOR;  Surgeon: Manya Sells, MD;  Location: [facility];  Service: Neurosurgery;  Laterality: Bilateral;   SUBTHALAMIC STIMULATOR INSERTION Bilateral 12/06/2013   Procedure: SUBTHALAMIC STIMULATOR INSERTION;  Surgeon: Manya Sells, MD;  Location: [facility];  Service: Neurosurgery;  Laterality: Bilateral;  Bilateral deep brain stimulator placement   Patient Active Problem List   Diagnosis Date Noted   Hematuria 10/10/2021   Sinus drainage 12/09/2020   Medicare annual wellness visit, subsequent 11/18/2020   Pacemaker    Dementia associated with Parkinson's disease (HCC) 06/17/2020    Vertigo 02/19/2020   Urine abnormality 02/19/2020   Dysphagia 02/23/2019   Fall at home 10/18/2017   REM behavioral disorder 11/02/2016   Radicular pain in right arm 10/09/2016   GERD (gastroesophageal reflux disease) 07/31/2016   Colon cancer screening 07/23/2015   Gout 02/18/2015   Depression 01/06/2015   Skin lesion 10/02/2014   Dupuytren's contracture 08/20/2014   Cough 07/21/2014   Lumbar stenosis with neurogenic claudication 06/13/2014   S/P deep brain stimulator placement 05/08/2014   Advance care planning 01/19/2014   Parkinson's disease (HCC) 12/13/2013   PTSD (post-traumatic stress disorder) 06/13/2013   Erectile dysfunction 06/13/2013   HLD (hyperlipidemia) 06/13/2013   Essential hypertension 06/03/2013   Bradycardia by electrocardiogram 06/03/2013   Obstructive sleep apnea 03/12/2013    ONSET DATE: ~15 years ago when pt was diagnosed with Parkinson's  REFERRING DIAG: G20.A1 (ICD-10-CM) - Parkinsons disease (HCC)   THERAPY DIAG:  Abnormality of gait and mobility  Unsteadiness on feet  Difficulty in walking, not elsewhere classified  Muscle weakness (generalized)  Other lack of coordination  Repeated falls  Other abnormalities of gait and mobility  Parkinson's disease without dyskinesia or fluctuating manifestations (HCC)   Rationale for Evaluation and Treatment: Rehabilitation  SUBJECTIVE:                                                                                                                                                                                             SUBJECTIVE STATEMENT:  Pt's S.O. reports pt fell when he came home from his trip and that he also fell another time, backwards into the screen door causing a place on his back.S.O. reports patient went to hospital due to blood in his urine and they anticipate in may be a kidney stone, but he was referred to Urologist.   Pt states his trip wasn't as much fun because only half of  the people came due to bad weather. Pt with difficulty recalling whether he fell or not stating I think I did.  Pt reports he is now having pain in 3 toes on his L foot. S.O. is aware of  this and states pt was referred to Orthopedic MD.    PERTINENT HISTORY: Parkinson's s/p deep brain stim, depression, HTN, GERD, clavicular fracture 2025, dementia, seizures/falls, LBP/PSF, hx of lumbar fusion in 04/2022, hx of hip fx 08/2022, arthritis, frequent falls, and OSA   PAIN:  Are you having pain? No  PRECAUTIONS: Fall and Other:   frequent falls, most recent on 05/22/2023 with ED visit, L superolateral clavicular fx (out of sling on 06/15/23 due to pt/family discontinuing it), deep brain stimulator   WEIGHT BEARING RESTRICTIONS: Unknown   FALLS: Has patient fallen in last 6 months? Yes. Number of falls 10+   LIVING ENVIRONMENT: Lives with: lives with their spouse (wife, Corbett Desanctis) and has hired caregivers Lives in: House/apartment Stairs: Yes: External: 2-3 steps; bilateral but cannot reach both (due to current home renovations, this is the entrance he uses) primarily uses R HR Has following equipment at home: Wheelchair (manual), shower chair, Grab bars, and Up-Walker  PLOF: Independent with household mobility with device, Requires assistive device for independence, Needs assistance with ADLs, and meal prep  Pt reports he frequently leaves his AD to the side when going to get something, often forgetting it - he requires reinforced education on consistent use of U-step walker  PATIENT GOALS: Improve Balance  OBJECTIVE:  Note: Objective measures were completed at Evaluation unless otherwise noted.  DIAGNOSTIC FINDINGS:   CT CERVICAL SPINE WITHOUT CONTRAST on 05/22/2023 IMPRESSION: Disc levels: Disc space narrowing and spurring in the lower cervical spine. Mild bilateral degenerative facet disease, left greater than right. Degenerative disc and facet disease throughout the cervical  spine. No acute bony abnormality.  CT HEAD WITHOUT CONTRAST on 05/22/2023 FINDINGS: Brain: Deep brain stimulators are noted with the tips in the region of the thalami bilaterally, unchanged. Mild atrophy. No acute intracranial abnormality. Specifically, no hemorrhage, hydrocephalus, mass lesion, acute infarction, or significant intracranial injury. Vascular: No hyperdense vessel or unexpected calcification. Skull: No acute calvarial abnormality. Sinuses/Orbits: No acute findings Other: None IMPRESSION: No acute intracranial abnormality.  LEFT SHOULDER - 2+ VIEW on 05/22/2023 IMPRESSION: Acute mildly displaced fracture of the lateral aspect of the left clavicle with intra-articular extension.  COGNITION: Overall cognitive status: History of cognitive impairments - at baseline and pt with very delayed verbal responses to subjective questioning and noted to be easily distracted in the therapy gym environment  LOWER EXTREMITY MMT:    MMT Right Eval Left Eval  Hip flexion 4 4  Hip extension    Hip abduction    Hip adduction    Hip internal rotation    Hip external rotation    Knee flexion 4+ 5  Knee extension 4+ 4+  Ankle dorsiflexion 4- 4  Ankle plantarflexion 4 4-  Ankle inversion    Ankle eversion    (Blank rows = not tested)  BED MOBILITY:  Sit to supine Min A and Mod A Supine to sit Min A and Mod A Requires adjustable bed with bedrail at home   TRANSFERS: Assistive device utilized: Up-Walker  Sit to stand: SBA, CGA, and Min A Stand to sit: SBA, CGA, and Min A Chair to chair: CGA and Min A Floor: Not assessed  STAIRS: Level of Assistance: 06/12/23: Min A Stair Negotiation Technique: Forwards with Bilateral Rails  Number of Stairs: 4  Height of Stairs: 6inch  Comments: 06/12/23 - posterior LOB at top of steps when turning around, minor posterior lean throughout  GAIT: Gait pattern: step through pattern, decreased step length- Right, decreased step length-  Left, decreased stride length, knee flexed in stance- Right, knee flexed in stance- Left, shuffling, festinating, trunk flexed, wide BOS, poor foot clearance- Right, and poor foot clearance- Left Distance walked: ~171ft Assistive device utilized: Up-Walker Level of assistance: CGA and Min A Comments: Noticeable festination when turning, stepping back to sit, or side stepping  FUNCTIONAL TESTS:  5 times sit to stand: 22 seconds Timed up and go (TUG): 06/12/23: 29.40 seconds using Up-Walker with CGA/min A 10 meter walk test: 0.65m/s Berg Balance Scale: 20/56  PATIENT SURVEYS:  ABC scale 06/12/23: 30%                                                                                                                             TREATMENT DATE:  09/28/23  Doffed his shoes and socks to assess L toes with noticeable bruising at the interphalangeal joint of 2nd, 3rd, and 4th toes - pt with significant point tenderness to palpation at 4th met head and some discomfort with palpation to 5th metatarsal but not as significant - no pain with palpation of 2nd and 3rd metatarsals. Patient's shoes appear to have adequate length for his toes. Pt unable to recall how he injured his L toes. Per wife, pt has been referred to Orthopedic MD to follow-up on this and they are awaiting the phone call to set-up his appointment   B LE functional strengthening to increase pt's HR via 3 rounds of 60sec sit<>stands from green chair, no UE support, with only 20sec rest break between Cuing for increased anterior trunk lean when rising to avoid posterior LOB in standing that causes him to return to sitting uncontrollably Pt with impaired ability to fully stand upright without having posterior LOB; therefore, added the following external goal Transitioned to having pt place hedgehogs over mirror to score a basket in the basketball goal on opposite side of mirror when in standing to promote increased upright posture and to  maintain standing balance  Significant improvement in his sit<>stand mechanics and balance in standing when given this task   Standing R LE NMR and strengthening including:  In // bars with B UE support performed hip abduction against RTB resistance while kicking external target to increase motor plan to activate correct muscle X15 reps per LE  Cuing to ensure R hip abductor activated during stance to prevent hip drop while performing L LE hip abduction Pt reports some sacral pain with this; however, his gait back to his seat was improved following this intervention compared to gait when walking to // bars  Gait training, no AD, 347ft with light min A - continues to have some hip instability with R hip drop during stance and narrow BOS but only 1x where he slightly crossed his feet when turning through doorway on his right Therapist tacitly cuing for hip abductor activation throughout  Improved BOS with less NBOS and mildly improved hip stability  No overt LOB and good/adequate gait speed throughout  Therapist reinforced education to pt on need to use his Up-Walker for all ambulation when not in therapy.   Dynamic standing balance with UE reaching task  5 external targets on tables at varying heights  Pt wearing 2lb AWs on each wrist for additional perturbation Pt touching targets on verbal command while maintaining balance without moving his feet Pt has difficulty comprehending and motor planning which hand therapist cued him to reach towards target (especially when asking him to reach across his body) Able to maintain balance for ~47min without posterior LOB, CGA/light min A for steadying    PATIENT EDUCATION: Education details: PT POC, LTGs, findings on balance assessments today Person educated: Patient and Caregiver Angelica Education method: Explanation Education comprehension: verbalized understanding and needs further education  HOME EXERCISE PROGRAM:   Access Code:  [email or web address] URL: [email or web address] Date: 06/28/2023 Prepared by: Carlen Chasten  Exercises - Walking  - 1 x daily - 7 x weekly - 2 sets - 3-5 minutes hold - Sit to Stand  - 1 x daily - 7 x weekly - 2 sets - 12-15 reps - Side Stepping with Resistance at Thighs and Counter Support  - 1 x daily - 7 x weekly - 3 sets - 10 reps - Supine Bridge with Resistance Band  - 1 x daily - 7 x weekly - 2 sets - 10-15 reps - Clamshell  - 1 x daily - 7 x weekly - 3 sets - 10 reps  GOALS: Goals reviewed with patient? Yes  SHORT TERM GOALS: Target date: 10/05/2023   Patient will be independent in home exercise program to improve strength/mobility for better functional independence with ADLs. Baseline: 06/20/2023: HEP provided 06/28/2023: updated 08/21/2023: pt inconsistent with compliance  Goal status: ONGOING   2. Patient will report <2 falls over past 6 weeks to demonstrate improved safety awareness at home.  Baseline: 06/01/2023 = 10+ falls in past 6 months; 3/27: 10+ falls per patient report in past 6 weeks; 08/21/2023: at least 5+ falls in past 6 weeks Goal Status: ONGOING  LONG TERM GOALS: Target date: 11/16/2023  1.  Patient (> 91 years old) will complete five times sit to stand test in < 15 seconds indicating an increased LE strength and improved balance. Baseline: 06/01/23: 22 seconds with posterior LOB; 3/27: 11.51 seconds; 5/5: 12.17 seconds  Goal status: MET  2.  Patient will increase Berg Balance score by > 6 points to demonstrate decreased fall risk during functional activities. Baseline: 06/01/23: 20/56; 3/27: 23/56; 5/5: 34/56 Goal status: ONGOING   3. Patient will reduce timed up and go to <11 seconds to reduce fall risk and demonstrate improved transfer/gait ability. Baseline: 06/12/23: 29.40 seconds using Up-Walker with CGA/min A; 3/27: 22.29 seconds with Up-walker and posterior LOB upon standing; 5/5: 16.13 seconds using Up-Walker with close SBA for safety Goal status:  ONGOING  4.Patient will increase 10 meter walk test to >1.95m/s as to improve gait speed for better community ambulation and to reduce fall risk. Baseline: 06/01/23: 0.59m/s using Up-Walker; 3/27: 0.89 m/s using Up Walker, 5/5: 0.928 m/s using Up-Walker (after warm-up) Goal status: ONGOING  5.  Patient will require no more than supervision assist with ascending/descending 3 steps using handrails per home set-up.  Baseline: 06/12/23: requires min A using HR due to posterior lean/LOB especially when turning at the top; 3/27: minA required using HR due to posterior LOB on first step; 5/5: supervision using bilateral HRs and self-selecting reciprocal pattern (no LOB)  Goal status: ONGOING  ASSESSMENT:  CLINICAL IMPRESSION:   Therapy session focused on functional B LE strength to improve transfers and hip stability during standing and ambulatory tasks. Patient continues to have R>L hip instability during gait which causes lateral balance instability as well as posterior lean/LOB bias during sit<>stand transfers and when performing standing tasks; however, improved significantly during session with externally focused goal today! Patient continues to remain highly motivated to participate and progress interventions. Patient demonstrates improved gait endurance today to ambulating 319ft without UE support and only CGA/light min A for steadying/balance. Patient also demos improved standing reaching balance today with no significant posterior lean noted. Patient will benefit from continued skilled physical therapy to further address his balance deficits in order to decrease his fall risk and promote increased independence with functional mobility and ADLs.    OBJECTIVE IMPAIRMENTS: Abnormal gait, decreased activity tolerance, decreased balance, decreased cognition, decreased endurance, decreased knowledge of condition, decreased knowledge of use of DME, decreased mobility, difficulty walking, decreased strength,  decreased safety awareness, hypomobility, impaired flexibility, impaired sensation, improper body mechanics, postural dysfunction, and pain.   ACTIVITY LIMITATIONS: carrying, lifting, bending, standing, squatting, sleeping, stairs, transfers, bed mobility, continence, bathing, toileting, dressing, reach over head, hygiene/grooming, and locomotion level  PARTICIPATION LIMITATIONS: meal prep, cleaning, laundry, medication management, driving, and community activity  PERSONAL FACTORS: Age, Sex, Time since onset of injury/illness/exacerbation, and 3+ comorbidities: Parkinson's, deep brain stim, depression, lipids, HTN, GERD, ED, dementia, seizures/falls, LBP/PSF, hx of lumbar fusion in 04/2022, hx of hip fx 08/2022, arthritis, frequent falls,  and OSA are also affecting patient's functional outcome.   REHAB POTENTIAL: Good  CLINICAL DECISION MAKING: Evolving/moderate complexity  EVALUATION COMPLEXITY: Moderate  PLAN:  PT FREQUENCY: 1-2x/week  PT DURATION: 12 weeks  PLANNED INTERVENTIONS: 97164- PT Re-evaluation, 97110-Therapeutic exercises, 97530- Therapeutic activity, 97112- Neuromuscular re-education, 97535- Self Care, 16109- Manual therapy, [phone]- Gait training, [phone]- Orthotic Fit/training, [phone]- Electrical stimulation (manual), Patient/Family education, Balance training, Stair training, Taping, Joint mobilization, Joint manipulation, Spinal mobilization, Vestibular training, Cognitive remediation, DME instructions, Cryotherapy, and Moist heat  PLAN FOR NEXT SESSION:     *add dual-task challenges to interventions when appropriate* Balance:   - posterior stepping strategy training with R posterolateral stepping (stepping over 1/2 foam roll with theraband resistance to external target) in  // bars  - static standing balance with trunk rotation/reaching tasks (specifically posteriorly) Dynamic Gait without AD - increase intensity of gait training:  - backwards walking  - quick/sudden  turning  - Head rotations   - resisted walking using matrix cable machine Strengthening:  - reinforce R hip and gluteal strengthening  - sit<>stands with overhead reach was very effective - bridge with resistance - clamshells! (Or supine hooklying hip abduction) - strengthening hips to create wider BOS for improved balance during gait - continue R hip abduction in // bars   Carlen Chasten, PT, DPT, NCS, CSRS Physical Therapist - [facility]  [facility]  2:41 PM 09/28/23

## 2023-10-02 ENCOUNTER — Ambulatory Visit: Payer: Medicare PPO | Admitting: Physical Therapy

## 2023-10-02 DIAGNOSIS — M6281 Muscle weakness (generalized): Secondary | ICD-10-CM

## 2023-10-02 DIAGNOSIS — R2681 Unsteadiness on feet: Secondary | ICD-10-CM | POA: Diagnosis not present

## 2023-10-02 DIAGNOSIS — R269 Unspecified abnormalities of gait and mobility: Secondary | ICD-10-CM | POA: Diagnosis not present

## 2023-10-02 DIAGNOSIS — R262 Difficulty in walking, not elsewhere classified: Secondary | ICD-10-CM | POA: Diagnosis not present

## 2023-10-02 DIAGNOSIS — R296 Repeated falls: Secondary | ICD-10-CM | POA: Diagnosis not present

## 2023-10-02 DIAGNOSIS — R278 Other lack of coordination: Secondary | ICD-10-CM | POA: Diagnosis not present

## 2023-10-02 DIAGNOSIS — G20A1 Parkinson's disease without dyskinesia, without mention of fluctuations: Secondary | ICD-10-CM

## 2023-10-02 DIAGNOSIS — R2689 Other abnormalities of gait and mobility: Secondary | ICD-10-CM

## 2023-10-02 NOTE — Therapy (Signed)
 OUTPATIENT PHYSICAL THERAPY TREATMENT   Patient Name: [name] MRN: [id number] DOB:Jun 22, 1950, 73 y.o., male Today's Date: 10/02/2023   PCP: Adelita Honer, PA-C REFERRING PROVIDER: Jacumin, Erika Leigh, PA    END OF SESSION:     PT End of Session - 10/02/23 1019     Visit Number 28    Number of Visits 45    Date for PT Re-Evaluation 11/16/23    Authorization Type [employer] 2025    Authorization Time Period Cert through 05/23/22    Progress Note Due on Visit 30    PT Start Time 1020    PT Stop Time 1104    PT Time Calculation (min) 44 min    Equipment Utilized During Treatment Gait belt    Activity Tolerance Patient tolerated treatment well    Behavior During Therapy WFL for tasks assessed/performed            Past Medical History:  Diagnosis Date   Anxiety disorder    Arthritis    Bradycardia    Cancer (HCC) 2013   skin cancer   Depression    ptsd   Dysrhythmia    chronic slow heart rate   GERD (gastroesophageal reflux disease)    H/O tachycardia-bradycardia syndrome    Headache(784.0)    tension headaches non recent   History of chicken pox    History of kidney stones    passed   Hypertension    treated with HCTZ   Major depressive disorder    Mild aortic regurgitation    Pacemaker    Parkinson's disease (HCC)    dx'ed 15 years ago   PTSD (post-traumatic stress disorder)    S/P deep brain stimulator placement    Shortness of breath dyspnea    Sleep apnea    doesn't use C-pap   Unspecified dementia, moderate, with psychotic disturbance (HCC)    Varicose veins    Past Surgical History:  Procedure Laterality Date   CATARACT EXTRACTION W/PHACO Right 01/17/2023   Procedure: CATARACT EXTRACTION PHACO AND INTRAOCULAR LENS PLACEMENT (IOC) RIGHT 6.33 00:42.6;  Surgeon: Clair Crews, MD;  Location: [facility];  Service: Ophthalmology;  Laterality: Right;   CATARACT EXTRACTION W/PHACO Left 02/07/2023   Procedure: CATARACT EXTRACTION PHACO  AND INTRAOCULAR LENS PLACEMENT (IOC) LEFT 6.35 00:38.3;  Surgeon: Clair Crews, MD;  Location: [facility];  Service: Ophthalmology;  Laterality: Left;   CHOLECYSTECTOMY N/A 10/22/2014   Procedure: LAPAROSCOPIC CHOLECYSTECTOMY WITH INTRAOPERATIVE CHOLANGIOGRAM;  Surgeon: Rhina Center III, MD;  Location: [facility];  Service: General;  Laterality: N/A;   cyst removed      from lip as a child   INTRAMEDULLARY (IM) NAIL INTERTROCHANTERIC N/A 02/18/2019   Procedure: INTRAMEDULLARY (IM) NAIL INTERTROCHANTRIC, RIGHT,;  Surgeon: Rande Bushy, MD;  Location: [facility];  Service: Orthopedics;  Laterality: N/A;   LUMBAR LAMINECTOMY/DECOMPRESSION MICRODISCECTOMY Bilateral 12/14/2012   Procedure: Bilateral lumbar three-four, four-five decompressive laminotomy/foraminotomy;  Surgeon: Baruch Bosch, MD;  Location: [facility];  Service: Neurosurgery;  Laterality: Bilateral;   PULSE GENERATOR IMPLANT Bilateral 12/13/2013   Procedure: Bilateral implantable pulse generator placement;  Surgeon: Manya Sells, MD;  Location: [facility];  Service: Neurosurgery;  Laterality: Bilateral;  Bilateral implantable pulse generator placement   skin cancer removed     from ears,   12 lft arm  rt leg 15   SUBTHALAMIC STIMULATOR BATTERY REPLACEMENT Bilateral 07/14/2017   Procedure: BILATERAL IMPLANTED PULSE GENERATOR CHANGE FOR DEEP BRAIN STIMULATOR;  Surgeon: Manya Sells, MD;  Location: [facility];  Service: Neurosurgery;  Laterality: Bilateral;   SUBTHALAMIC STIMULATOR INSERTION Bilateral 12/06/2013   Procedure: SUBTHALAMIC STIMULATOR INSERTION;  Surgeon: Manya Sells, MD;  Location: [facility];  Service: Neurosurgery;  Laterality: Bilateral;  Bilateral deep brain stimulator placement   Patient Active Problem List   Diagnosis Date Noted   Hematuria 10/10/2021   Sinus drainage 12/09/2020   Medicare annual wellness visit, subsequent 11/18/2020   Pacemaker    Dementia associated with Parkinson's disease (HCC) 06/17/2020    Vertigo 02/19/2020   Urine abnormality 02/19/2020   Dysphagia 02/23/2019   Fall at home 10/18/2017   REM behavioral disorder 11/02/2016   Radicular pain in right arm 10/09/2016   GERD (gastroesophageal reflux disease) 07/31/2016   Colon cancer screening 07/23/2015   Gout 02/18/2015   Depression 01/06/2015   Skin lesion 10/02/2014   Dupuytren's contracture 08/20/2014   Cough 07/21/2014   Lumbar stenosis with neurogenic claudication 06/13/2014   S/P deep brain stimulator placement 05/08/2014   Advance care planning 01/19/2014   Parkinson's disease (HCC) 12/13/2013   PTSD (post-traumatic stress disorder) 06/13/2013   Erectile dysfunction 06/13/2013   HLD (hyperlipidemia) 06/13/2013   Essential hypertension 06/03/2013   Bradycardia by electrocardiogram 06/03/2013   Obstructive sleep apnea 03/12/2013    ONSET DATE: ~15 years ago when pt was diagnosed with Parkinson's  REFERRING DIAG: G20.A1 (ICD-10-CM) - Parkinsons disease (HCC)   THERAPY DIAG:  Abnormality of gait and mobility  Unsteadiness on feet  Difficulty in walking, not elsewhere classified  Muscle weakness (generalized)  Other lack of coordination  Repeated falls  Other abnormalities of gait and mobility  Parkinson's disease without dyskinesia or fluctuating manifestations (HCC)   Rationale for Evaluation and Treatment: Rehabilitation  SUBJECTIVE:                                                                                                                                                                                             SUBJECTIVE STATEMENT:  S.O. reports yesterday patient thought he was back in the [employer] and she had to help him put together a uniform to wear to church. Pt still undergoing Urology follow-up.  S.O reports pt just woke up this morning shortly before coming to therapy appointment and therapist noticed pt with slower/more delayed responses to questions during session, likely due  to this.  S.O. reports pt continues to have stumbles on regular basis, but not falls; however, did have some close calls.  Pt states the toes on his L foot are getting better and not hurting as much.  Pt states he is having really bad pain in his low  back that is constant, but then is able to identify that it is worse when going from sitting to standing. Pt with difficulty rating it on numerical scale, but states it is pretty bad especially when he comes to stand. Pt states he is also having some pain between his shoulder blades and in L bicep area; however, pt denies chest pain. (Vitals assessed during session and therapist made S.O. aware of elevated BP)  Pt states I think I'm getting tired of this mess...this pain all of the time.   Pt states he thinks he was put on a new medication, but unable to recall the specifics, but states he believes it may have been due to his high BP. However, S.O. reports pt was not put in a new medication.   PERTINENT HISTORY: Parkinson's s/p deep brain stim, depression, HTN, GERD, clavicular fracture 2025, dementia, seizures/falls, LBP/PSF, hx of lumbar fusion in 04/2022, hx of hip fx 08/2022, arthritis, frequent falls, and OSA   PAIN:  Are you having pain? No  PRECAUTIONS: Fall and Other:   frequent falls, most recent on 05/22/2023 with ED visit, L superolateral clavicular fx (out of sling on 06/15/23 due to pt/family discontinuing it), deep brain stimulator   WEIGHT BEARING RESTRICTIONS: Unknown   FALLS: Has patient fallen in last 6 months? Yes. Number of falls 10+   LIVING ENVIRONMENT: Lives with: lives with their spouse (wife, Corbett Desanctis) and has hired caregivers Lives in: House/apartment Stairs: Yes: External: 2-3 steps; bilateral but cannot reach both (due to current home renovations, this is the entrance he uses) primarily uses R HR Has following equipment at home: Wheelchair (manual), shower chair, Grab bars, and Up-Walker  PLOF: Independent  with household mobility with device, Requires assistive device for independence, Needs assistance with ADLs, and meal prep  Pt reports he frequently leaves his AD to the side when going to get something, often forgetting it - he requires reinforced education on consistent use of U-step walker  PATIENT GOALS: Improve Balance  OBJECTIVE:  Note: Objective measures were completed at Evaluation unless otherwise noted.  DIAGNOSTIC FINDINGS:   CT CERVICAL SPINE WITHOUT CONTRAST on 05/22/2023 IMPRESSION: Disc levels: Disc space narrowing and spurring in the lower cervical spine. Mild bilateral degenerative facet disease, left greater than right. Degenerative disc and facet disease throughout the cervical spine. No acute bony abnormality.  CT HEAD WITHOUT CONTRAST on 05/22/2023 FINDINGS: Brain: Deep brain stimulators are noted with the tips in the region of the thalami bilaterally, unchanged. Mild atrophy. No acute intracranial abnormality. Specifically, no hemorrhage, hydrocephalus, mass lesion, acute infarction, or significant intracranial injury. Vascular: No hyperdense vessel or unexpected calcification. Skull: No acute calvarial abnormality. Sinuses/Orbits: No acute findings Other: None IMPRESSION: No acute intracranial abnormality.  LEFT SHOULDER - 2+ VIEW on 05/22/2023 IMPRESSION: Acute mildly displaced fracture of the lateral aspect of the left clavicle with intra-articular extension.  COGNITION: Overall cognitive status: History of cognitive impairments - at baseline and pt with very delayed verbal responses to subjective questioning and noted to be easily distracted in the therapy gym environment  LOWER EXTREMITY MMT:    MMT Right Eval Left Eval  Hip flexion 4 4  Hip extension    Hip abduction    Hip adduction    Hip internal rotation    Hip external rotation    Knee flexion 4+ 5  Knee extension 4+ 4+  Ankle dorsiflexion 4- 4  Ankle plantarflexion 4 4-  Ankle  inversion    Ankle eversion    (  Blank rows = not tested)  BED MOBILITY:  Sit to supine Min A and Mod A Supine to sit Min A and Mod A Requires adjustable bed with bedrail at home   TRANSFERS: Assistive device utilized: Up-Walker  Sit to stand: SBA, CGA, and Min A Stand to sit: SBA, CGA, and Min A Chair to chair: CGA and Min A Floor: Not assessed  STAIRS: Level of Assistance: 06/12/23: Min A Stair Negotiation Technique: Forwards with Bilateral Rails  Number of Stairs: 4  Height of Stairs: 6inch  Comments: 06/12/23 - posterior LOB at top of steps when turning around, minor posterior lean throughout  GAIT: Gait pattern: step through pattern, decreased step length- Right, decreased step length- Left, decreased stride length, knee flexed in stance- Right, knee flexed in stance- Left, shuffling, festinating, trunk flexed, wide BOS, poor foot clearance- Right, and poor foot clearance- Left Distance walked: ~161ft Assistive device utilized: Up-Walker Level of assistance: CGA and Min A Comments: Noticeable festination when turning, stepping back to sit, or side stepping  FUNCTIONAL TESTS:  5 times sit to stand: 22 seconds Timed up and go (TUG): 06/12/23: 29.40 seconds using Up-Walker with CGA/min A 10 meter walk test: 0.14m/s Berg Balance Scale: 20/56  PATIENT SURVEYS:  ABC scale 06/12/23: 30%                                                                                                                             TREATMENT DATE:  10/02/23   Doffed his shoes and socks to assess L toes with noticeable improvement in bruising on 2nd, 3rd, and 4th toes - pt with continued point tenderness to palpation at 3rd and 4th met head - no pain with palpation of 2nd and 3rd metatarsals. Pt reports he may have a hx of gout, but unable to remember exactly. [name] previously stated, pt had been referred to Orthopedic MD to follow-up on this   Stand pivot transport chair>Nustep with heavier min A  today as pt not coming to full stand prior to initiating turning.   Vitals, in sitting, to start: BP 180/90 (MAP 116), HR 67bpm, SpO2 100%  Donned moist heat pack to pt's back during Nustep for pain management with pt reporting improvement in his pain symptoms. No adverse effects of moist heat noted.  B UE and B LE reciprocal movement pattern on Nustep for cardiovascular training and B LE functional strengthening against level 1 resistance for 30sec, increased to level 2 for 36min30 sec totaling 5 minutes, averaging >40SPM (steps per minute)  Re-assessed vitals after Nustep: BP 164/96 (MAP 116), HR 88bpm    Pt coming to stand with increased ease after heat application and use of Nustep requiring less assist to transfer back to transport chair.     Gait training 320ft using UpWalker with skilled min assist for balance. Pt demonstrating the following gait deviations with therapist providing the described cuing and facilitation for improvement:  Narrow BOS with  feet scuffing against each other during every step  Therapist facilitating at hip abductors to improve hip stability although he continues to have bilateral hip drop Slow gait speed with decreased step lengths bilaterally Slight forward trunk flexed posture  At end of session pt states I'm better off than when I came in here.   Vitals sitting at end of session: BP 164/94 (MAP 113), HR 84bpm   Educated pt's wife on elevated BP during session and she plans to check it more consistently at home.     PATIENT EDUCATION: Education details: PT POC, LTGs, findings on balance assessments today Person educated: Patient and Caregiver Angelica Education method: Explanation Education comprehension: verbalized understanding and needs further education  HOME EXERCISE PROGRAM:   Access Code: [email or web address] URL: [email or web address] Date: 06/28/2023 Prepared by: Carlen Chasten  Exercises - Walking  - 1 x daily - 7 x weekly  - 2 sets - 3-5 minutes hold - Sit to Stand  - 1 x daily - 7 x weekly - 2 sets - 12-15 reps - Side Stepping with Resistance at Thighs and Counter Support  - 1 x daily - 7 x weekly - 3 sets - 10 reps - Supine Bridge with Resistance Band  - 1 x daily - 7 x weekly - 2 sets - 10-15 reps - Clamshell  - 1 x daily - 7 x weekly - 3 sets - 10 reps  GOALS: Goals reviewed with patient? Yes  SHORT TERM GOALS: Target date: 10/05/2023   Patient will be independent in home exercise program to improve strength/mobility for better functional independence with ADLs. Baseline: 06/20/2023: HEP provided 06/28/2023: updated 08/21/2023: pt inconsistent with compliance  Goal status: ONGOING   2. Patient will report <2 falls over past 6 weeks to demonstrate improved safety awareness at home.  Baseline: 06/01/2023 = 10+ falls in past 6 months; 3/27: 10+ falls per patient report in past 6 weeks; 08/21/2023: at least 5+ falls in past 6 weeks Goal Status: ONGOING  LONG TERM GOALS: Target date: 11/16/2023  1.  Patient (> 22 years old) will complete five times sit to stand test in < 15 seconds indicating an increased LE strength and improved balance. Baseline: 06/01/23: 22 seconds with posterior LOB; 3/27: 11.51 seconds; 5/5: 12.17 seconds  Goal status: MET  2.  Patient will increase Berg Balance score by > 6 points to demonstrate decreased fall risk during functional activities. Baseline: 06/01/23: 20/56; 3/27: 23/56; 5/5: 34/56 Goal status: ONGOING   3. Patient will reduce timed up and go to <11 seconds to reduce fall risk and demonstrate improved transfer/gait ability. Baseline: 06/12/23: 29.40 seconds using Up-Walker with CGA/min A; 3/27: 22.29 seconds with Up-walker and posterior LOB upon standing; 5/5: 16.13 seconds using Up-Walker with close SBA for safety Goal status: ONGOING  4.Patient will increase 10 meter walk test to >1.50m/s as to improve gait speed for better community ambulation and to reduce fall  risk. Baseline: 06/01/23: 0.22m/s using Up-Walker; 3/27: 0.89 m/s using Up Walker, 5/5: 0.928 m/s using Up-Walker (after warm-up) Goal status: ONGOING  5.  Patient will require no more than supervision assist with ascending/descending 3 steps using handrails per home set-up.  Baseline: 06/12/23: requires min A using HR due to posterior lean/LOB especially when turning at the top; 3/27: minA required using HR due to posterior LOB on first step; 5/5: supervision using bilateral HRs and self-selecting reciprocal pattern (no LOB)  Goal status: ONGOING   ASSESSMENT:  CLINICAL IMPRESSION:   Patient reporting increased  low back pain to start session and reports recently getting OOB for the day. Therapy session had to be modified due to pt having increased pain today with pt having good response to moist heat use during Nustep for gentle progression of movement for pain management. This resulted in pt being able to come to stand with less assistance and with less pain with ability to progress to gait training using Up-Walker. Pt continues to have significantly NBOS with his feet scuffing against each other during each swing while ambulating with Up-Walker. Pt may benefit from trial using rollator to see how this impacts his gait mechanics and balance because pt does not properly use brake system on Up-Walker anyhow. Patient will benefit from continued skilled physical therapy to further address his balance deficits in order to decrease his fall risk and promote increased independence with functional mobility and ADLs.    OBJECTIVE IMPAIRMENTS: Abnormal gait, decreased activity tolerance, decreased balance, decreased cognition, decreased endurance, decreased knowledge of condition, decreased knowledge of use of DME, decreased mobility, difficulty walking, decreased strength, decreased safety awareness, hypomobility, impaired flexibility, impaired sensation, improper body mechanics, postural dysfunction, and  pain.   ACTIVITY LIMITATIONS: carrying, lifting, bending, standing, squatting, sleeping, stairs, transfers, bed mobility, continence, bathing, toileting, dressing, reach over head, hygiene/grooming, and locomotion level  PARTICIPATION LIMITATIONS: meal prep, cleaning, laundry, medication management, driving, and community activity  PERSONAL FACTORS: Age, Sex, Time since onset of injury/illness/exacerbation, and 3+ comorbidities: Parkinson's, deep brain stim, depression, lipids, HTN, GERD, ED, dementia, seizures/falls, LBP/PSF, hx of lumbar fusion in 04/2022, hx of hip fx 08/2022, arthritis, frequent falls,  and OSA are also affecting patient's functional outcome.   REHAB POTENTIAL: Good  CLINICAL DECISION MAKING: Evolving/moderate complexity  EVALUATION COMPLEXITY: Moderate  PLAN:  PT FREQUENCY: 1-2x/week  PT DURATION: 12 weeks  PLANNED INTERVENTIONS: 97164- PT Re-evaluation, 97110-Therapeutic exercises, 97530- Therapeutic activity, 97112- Neuromuscular re-education, 97535- Self Care, 16109- Manual therapy, [phone]- Gait training, [phone]- Orthotic Fit/training, [phone]- Electrical stimulation (manual), Patient/Family education, Balance training, Stair training, Taping, Joint mobilization, Joint manipulation, Spinal mobilization, Vestibular training, Cognitive remediation, DME instructions, Cryotherapy, and Moist heat  PLAN FOR NEXT SESSION:     **Monitor vitals**  ---- try rollator? Pt has excessive NBOS with up-walker *add dual-task challenges to interventions when appropriate* Balance:   - posterior stepping strategy training with R posterolateral stepping (stepping over 1/2 foam roll with theraband resistance to external target) in  // bars  - static standing balance with trunk rotation/reaching tasks (specifically posteriorly) Dynamic Gait without AD - increase intensity of gait training:  - backwards walking  - quick/sudden turning  - Head rotations   - resisted walking using matrix  cable machine Strengthening:  - reinforce R hip and gluteal strengthening  - sit<>stands with overhead reach was very effective - bridge with resistance - clamshells! (Or supine hooklying hip abduction) - strengthening hips to create wider BOS for improved balance during gait - continue R hip abduction in // bars   Carlen Chasten, PT, DPT, NCS, CSRS Physical Therapist - [facility]  11:07 AM 10/02/23

## 2023-10-05 ENCOUNTER — Ambulatory Visit: Payer: Medicare PPO

## 2023-10-05 DIAGNOSIS — M6281 Muscle weakness (generalized): Secondary | ICD-10-CM | POA: Diagnosis not present

## 2023-10-05 DIAGNOSIS — R269 Unspecified abnormalities of gait and mobility: Secondary | ICD-10-CM

## 2023-10-05 DIAGNOSIS — R262 Difficulty in walking, not elsewhere classified: Secondary | ICD-10-CM | POA: Diagnosis not present

## 2023-10-05 DIAGNOSIS — R278 Other lack of coordination: Secondary | ICD-10-CM | POA: Diagnosis not present

## 2023-10-05 DIAGNOSIS — G20A1 Parkinson's disease without dyskinesia, without mention of fluctuations: Secondary | ICD-10-CM | POA: Diagnosis not present

## 2023-10-05 DIAGNOSIS — R2681 Unsteadiness on feet: Secondary | ICD-10-CM

## 2023-10-05 DIAGNOSIS — R296 Repeated falls: Secondary | ICD-10-CM | POA: Diagnosis not present

## 2023-10-05 DIAGNOSIS — R2689 Other abnormalities of gait and mobility: Secondary | ICD-10-CM | POA: Diagnosis not present

## 2023-10-05 NOTE — Therapy (Signed)
 OUTPATIENT PHYSICAL THERAPY TREATMENT   Patient Name: [name] MRN: [id number] DOB:[birth year], 73 y.o., male Today's Date: 10/05/2023   PCP: Adelita Honer, PA-C REFERRING PROVIDER: Jacumin, Erika Leigh, PA    END OF SESSION:     PT End of Session - 10/05/23 1220     Visit Number 29    Number of Visits 45    Date for PT Re-Evaluation 11/16/23    Authorization Type [employer] [id number]    Progress Note Due on Visit 30    PT Start Time 1145    PT Stop Time 1225    PT Time Calculation (min) 40 min    Equipment Utilized During Treatment Gait belt    Activity Tolerance Patient tolerated treatment well;No increased pain    Behavior During Therapy WFL for tasks assessed/performed            Past Medical History:  Diagnosis Date   Anxiety disorder    Arthritis    Bradycardia    Cancer (HCC) 2013   skin cancer   Depression    ptsd   Dysrhythmia    chronic slow heart rate   GERD (gastroesophageal reflux disease)    H/O tachycardia-bradycardia syndrome    Headache(784.0)    tension headaches non recent   History of chicken pox    History of kidney stones    passed   Hypertension    treated with HCTZ   Major depressive disorder    Mild aortic regurgitation    Pacemaker    Parkinson's disease (HCC)    dx'ed 15 years ago   PTSD (post-traumatic stress disorder)    S/P deep brain stimulator placement    Shortness of breath dyspnea    Sleep apnea    doesn't use C-pap   Unspecified dementia, moderate, with psychotic disturbance (HCC)    Varicose veins    Past Surgical History:  Procedure Laterality Date   CATARACT EXTRACTION W/PHACO Right 01/17/2023   Procedure: CATARACT EXTRACTION PHACO AND INTRAOCULAR LENS PLACEMENT (IOC) RIGHT 6.33 00:42.6;  Surgeon: Clair Crews, MD;  Location: [facility];  Service: Ophthalmology;  Laterality: Right;   CATARACT EXTRACTION W/PHACO Left 02/07/2023   Procedure: CATARACT EXTRACTION PHACO AND INTRAOCULAR LENS PLACEMENT  (IOC) LEFT 6.35 00:38.3;  Surgeon: Clair Crews, MD;  Location: [facility];  Service: Ophthalmology;  Laterality: Left;   CHOLECYSTECTOMY N/A 10/22/2014   Procedure: LAPAROSCOPIC CHOLECYSTECTOMY WITH INTRAOPERATIVE CHOLANGIOGRAM;  Surgeon: Rhina Center III, MD;  Location: [facility];  Service: General;  Laterality: N/A;   cyst removed      from lip as a child   INTRAMEDULLARY (IM) NAIL INTERTROCHANTERIC N/A 02/18/2019   Procedure: INTRAMEDULLARY (IM) NAIL INTERTROCHANTRIC, RIGHT,;  Surgeon: Rande Bushy, MD;  Location: [facility];  Service: Orthopedics;  Laterality: N/A;   LUMBAR LAMINECTOMY/DECOMPRESSION MICRODISCECTOMY Bilateral 12/14/2012   Procedure: Bilateral lumbar three-four, four-five decompressive laminotomy/foraminotomy;  Surgeon: Baruch Bosch, MD;  Location: [facility];  Service: Neurosurgery;  Laterality: Bilateral;   PULSE GENERATOR IMPLANT Bilateral 12/13/2013   Procedure: Bilateral implantable pulse generator placement;  Surgeon: Manya Sells, MD;  Location: [facility];  Service: Neurosurgery;  Laterality: Bilateral;  Bilateral implantable pulse generator placement   skin cancer removed     from ears,   12 lft arm  rt leg 15   SUBTHALAMIC STIMULATOR BATTERY REPLACEMENT Bilateral 07/14/2017   Procedure: BILATERAL IMPLANTED PULSE GENERATOR CHANGE FOR DEEP BRAIN STIMULATOR;  Surgeon: Manya Sells, MD;  Location: [facility];  Service: Neurosurgery;  Laterality: Bilateral;   SUBTHALAMIC STIMULATOR INSERTION Bilateral 12/06/2013   Procedure: SUBTHALAMIC STIMULATOR INSERTION;  Surgeon: Manya Sells, MD;  Location: [facility];  Service: Neurosurgery;  Laterality: Bilateral;  Bilateral deep brain stimulator placement   Patient Active Problem List   Diagnosis Date Noted   Hematuria 10/10/2021   Sinus drainage 12/09/2020   Medicare annual wellness visit, subsequent 11/18/2020   Pacemaker    Dementia associated with Parkinson's disease (HCC) 06/17/2020   Vertigo 02/19/2020   Urine  abnormality 02/19/2020   Dysphagia 02/23/2019   Fall at home 10/18/2017   REM behavioral disorder 11/02/2016   Radicular pain in right arm 10/09/2016   GERD (gastroesophageal reflux disease) 07/31/2016   Colon cancer screening 07/23/2015   Gout 02/18/2015   Depression 01/06/2015   Skin lesion 10/02/2014   Dupuytren's contracture 08/20/2014   Cough 07/21/2014   Lumbar stenosis with neurogenic claudication 06/13/2014   S/P deep brain stimulator placement 05/08/2014   Advance care planning 01/19/2014   Parkinson's disease (HCC) 12/13/2013   PTSD (post-traumatic stress disorder) 06/13/2013   Erectile dysfunction 06/13/2013   HLD (hyperlipidemia) 06/13/2013   Essential hypertension 06/03/2013   Bradycardia by electrocardiogram 06/03/2013   Obstructive sleep apnea 03/12/2013    ONSET DATE: ~15 years ago when pt was diagnosed with Parkinson's  REFERRING DIAG: G20.A1 (ICD-10-CM) - Parkinsons disease (HCC)   THERAPY DIAG:  Abnormality of gait and mobility  Unsteadiness on feet  Difficulty in walking, not elsewhere classified  Muscle weakness (generalized)  Rationale for Evaluation and Treatment: Rehabilitation  SUBJECTIVE:                                                                                                                                                                                             SUBJECTIVE STATEMENT: Chantry reports to be doing pretty good today. His wife reports another recent fall, has a bump on his head, but no major injury sustained. Pt has not been seen by orthopedics yet.   PERTINENT HISTORY: Parkinson's s/p deep brain stim, depression, HTN, GERD, clavicular fracture 2025, dementia, seizures/falls, LBP/PSF, hx of lumbar fusion in 04/2022, hx of hip fx 08/2022, arthritis, frequent falls, and OSA   PAIN:  Are you having pain? No  PRECAUTIONS: Fall and Other:   frequent falls, most recent on 05/22/2023 with ED visit, L superolateral clavicular fx  (out of sling on 06/15/23 due to pt/family discontinuing it), deep brain stimulator   WEIGHT BEARING RESTRICTIONS: Unknown   FALLS: Has patient fallen in last 6 months? Yes. Number of falls 10+   LIVING ENVIRONMENT: Lives with: lives with their spouse (wife, [name]) and  has hired caregivers Lives in: House/apartment Stairs: Yes: External: 2-3 steps; bilateral but cannot reach both (due to current home renovations, this is the entrance he uses) primarily uses R HR Has following equipment at home: Wheelchair (manual), shower chair, Grab bars, and Up-Walker  PLOF: Independent with household mobility with device, Requires assistive device for independence, Needs assistance with ADLs, and meal prep  Pt reports he frequently leaves his AD to the side when going to get something, often forgetting it - he requires reinforced education on consistent use of U-step walker  PATIENT GOALS: Improve Balance  OBJECTIVE:  Note: Objective measures were completed at Evaluation unless otherwise noted.  DIAGNOSTIC FINDINGS:   CT CERVICAL SPINE WITHOUT CONTRAST on 05/22/2023 IMPRESSION: Disc levels: Disc space narrowing and spurring in the lower cervical spine. Mild bilateral degenerative facet disease, left greater than right. Degenerative disc and facet disease throughout the cervical spine. No acute bony abnormality.  CT HEAD WITHOUT CONTRAST on 05/22/2023 FINDINGS: Brain: Deep brain stimulators are noted with the tips in the region of the thalami bilaterally, unchanged. Mild atrophy. No acute intracranial abnormality. Specifically, no hemorrhage, hydrocephalus, mass lesion, acute infarction, or significant intracranial injury. Vascular: No hyperdense vessel or unexpected calcification. Skull: No acute calvarial abnormality. Sinuses/Orbits: No acute findings Other: None IMPRESSION: No acute intracranial abnormality.  LEFT SHOULDER - 2+ VIEW on 05/22/2023 IMPRESSION: Acute mildly displaced  fracture of the lateral aspect of the left clavicle with intra-articular extension.  COGNITION: Overall cognitive status: History of cognitive impairments - at baseline and pt with very delayed verbal responses to subjective questioning and noted to be easily distracted in the therapy gym environment  LOWER EXTREMITY MMT:    MMT Right Eval Left Eval  Hip flexion 4 4  Hip extension    Hip abduction    Hip adduction    Hip internal rotation    Hip external rotation    Knee flexion 4+ 5  Knee extension 4+ 4+  Ankle dorsiflexion 4- 4  Ankle plantarflexion 4 4-  Ankle inversion    Ankle eversion    (Blank rows = not tested)  BED MOBILITY:  Sit to supine Min A and Mod A Supine to sit Min A and Mod A Requires adjustable bed with bedrail at home   TRANSFERS: Assistive device utilized: Up-Walker  Sit to stand: SBA, CGA, and Min A Stand to sit: SBA, CGA, and Min A Chair to chair: CGA and Min A Floor: Not assessed  STAIRS: Level of Assistance: 06/12/23: Min A Stair Negotiation Technique: Forwards with Bilateral Rails  Number of Stairs: 4  Height of Stairs: 6inch  Comments: 06/12/23 - posterior LOB at top of steps when turning around, minor posterior lean throughout  GAIT: Gait pattern: step through pattern, decreased step length- Right, decreased step length- Left, decreased stride length, knee flexed in stance- Right, knee flexed in stance- Left, shuffling, festinating, trunk flexed, wide BOS, poor foot clearance- Right, and poor foot clearance- Left Distance walked: ~153ft Assistive device utilized: Up-Walker Level of assistance: CGA and Min A Comments: Noticeable festination when turning, stepping back to sit, or side stepping  FUNCTIONAL TESTS:  5 times sit to stand: 22 seconds Timed up and go (TUG): 06/12/23: 29.40 seconds using Up-Walker with CGA/min A 10 meter walk test: 0.51m/s Berg Balance Scale: 20/56  PATIENT SURVEYS:  ABC scale 06/12/23: 30%  TREATMENT DATE:  10/05/23 *minA provided throughout session for transfers and AMB  -Sled push 8x64ft c 60sec recovery seated: first interval 38sec, final interval 21 sec; pushing a guest chair from behind, 45lb in chair -short sitting on dynadisc overhead wall rebounding with blue physioball 2x10, 2x8 -short sitting on dynadisc overhead wall rebounding with rainbow ball 3x15  -AMB to nustep without device -Nustep c hot pack behind back x5 minutes, level 3.    PATIENT EDUCATION: Education details: PT POC, LTGs, findings on balance assessments today Person educated: Patient and Caregiver Angelica Education method: Explanation Education comprehension: verbalized understanding and needs further education  HOME EXERCISE PROGRAM:   Access Code: [email or web address] URL: [email or web address] Date: 06/28/2023 Prepared by: Carlen Chasten  Exercises - Walking  - 1 x daily - 7 x weekly - 2 sets - 3-5 minutes hold - Sit to Stand  - 1 x daily - 7 x weekly - 2 sets - 12-15 reps - Side Stepping with Resistance at Thighs and Counter Support  - 1 x daily - 7 x weekly - 3 sets - 10 reps - Supine Bridge with Resistance Band  - 1 x daily - 7 x weekly - 2 sets - 10-15 reps - Clamshell  - 1 x daily - 7 x weekly - 3 sets - 10 reps  GOALS: Goals reviewed with patient? Yes  SHORT TERM GOALS: Target date: 10/05/2023   Patient will be independent in home exercise program to improve strength/mobility for better functional independence with ADLs. Baseline: 06/20/2023: HEP provided 06/28/2023: updated 08/21/2023: pt inconsistent with compliance  Goal status: ONGOING   2. Patient will report <2 falls over past 6 weeks to demonstrate improved safety awareness at home.  Baseline: 06/01/2023 = 10+ falls in past 6 months; 3/27: 10+ falls per patient report in past 6 weeks; 08/21/2023: at least 5+ falls in past 6  weeks Goal Status: ONGOING  LONG TERM GOALS: Target date: 11/16/2023  1.  Patient (> 64 years old) will complete five times sit to stand test in < 15 seconds indicating an increased LE strength and improved balance. Baseline: 06/01/23: 22 seconds with posterior LOB; 3/27: 11.51 seconds; 5/5: 12.17 seconds  Goal status: MET  2.  Patient will increase Berg Balance score by > 6 points to demonstrate decreased fall risk during functional activities. Baseline: 06/01/23: 20/56; 3/27: 23/56; 5/5: 34/56 Goal status: ONGOING   3. Patient will reduce timed up and go to <11 seconds to reduce fall risk and demonstrate improved transfer/gait ability. Baseline: 06/12/23: 29.40 seconds using Up-Walker with CGA/min A; 3/27: 22.29 seconds with Up-walker and posterior LOB upon standing; 5/5: 16.13 seconds using Up-Walker with close SBA for safety Goal status: ONGOING  4.Patient will increase 10 meter walk test to >1.25m/s as to improve gait speed for better community ambulation and to reduce fall risk. Baseline: 06/01/23: 0.8m/s using Up-Walker; 3/27: 0.89 m/s using Up Walker, 5/5: 0.928 m/s using Up-Walker (after warm-up) Goal status: ONGOING  5.  Patient will require no more than supervision assist with ascending/descending 3 steps using handrails per home set-up.  Baseline: 06/12/23: requires min A using HR due to posterior lean/LOB especially when turning at the top; 3/27: minA required using HR due to posterior LOB on first step; 5/5: supervision using bilateral HRs and self-selecting reciprocal pattern (no LOB)  Goal status: ONGOING   ASSESSMENT: CLINICAL IMPRESSION:   Continued with postural control interventions in seated, standing, walking formats. Used ball incorporation for large amplitude movement facilitation as well  as variable reactivity. Righting reactions are fast and accurate 90% of tosses, pt also manages retreiving ball from the floor in a large radius while maintaining balance. Added in high  intensity intervals this date for the first time, heavy sled push. Pt does well with high intensity intervals overall performed more quickly each time. Patient will benefit from continued skilled physical therapy to further address his balance deficits in order to decrease his fall risk and promote increased independence with functional mobility and ADLs.    OBJECTIVE IMPAIRMENTS: Abnormal gait, decreased activity tolerance, decreased balance, decreased cognition, decreased endurance, decreased knowledge of condition, decreased knowledge of use of DME, decreased mobility, difficulty walking, decreased strength, decreased safety awareness, hypomobility, impaired flexibility, impaired sensation, improper body mechanics, postural dysfunction, and pain.   ACTIVITY LIMITATIONS: carrying, lifting, bending, standing, squatting, sleeping, stairs, transfers, bed mobility, continence, bathing, toileting, dressing, reach over head, hygiene/grooming, and locomotion level  PARTICIPATION LIMITATIONS: meal prep, cleaning, laundry, medication management, driving, and community activity  PERSONAL FACTORS: Age, Sex, Time since onset of injury/illness/exacerbation, and 3+ comorbidities: Parkinson's, deep brain stim, depression, lipids, HTN, GERD, ED, dementia, seizures/falls, LBP/PSF, hx of lumbar fusion in 04/2022, hx of hip fx 08/2022, arthritis, frequent falls,  and OSA are also affecting patient's functional outcome.   REHAB POTENTIAL: Good  CLINICAL DECISION MAKING: Evolving/moderate complexity  EVALUATION COMPLEXITY: Moderate  PLAN:  PT FREQUENCY: 1-2x/week  PT DURATION: 12 weeks  PLANNED INTERVENTIONS: 97164- PT Re-evaluation, 97110-Therapeutic exercises, 97530- Therapeutic activity, 97112- Neuromuscular re-education, 97535- Self Care, [id number]- Manual therapy, [phone]- Gait training, [phone]- Orthotic Fit/training, [phone]- Electrical stimulation (manual), Patient/Family education, Balance training, Stair  training, Taping, Joint mobilization, Joint manipulation, Spinal mobilization, Vestibular training, Cognitive remediation, DME instructions, Cryotherapy, and Moist heat  PLAN FOR NEXT SESSION:        12:22 PM, 10/05/23 Dawn Eth, PT, DPT Physical Therapist - [facility]  Outpatient Physical Therapy- Main Campus [phone]

## 2023-10-09 ENCOUNTER — Ambulatory Visit: Payer: Medicare PPO | Admitting: Physical Therapy

## 2023-10-10 ENCOUNTER — Ambulatory Visit: Admitting: Psychology

## 2023-10-10 NOTE — Progress Notes (Unsigned)
                edge,  experiencing concentration difficulties, having trouble falling or staying asleep, exhibiting a general  state of irritability).: No Description Entered (Status: improved). Motor tension (e.g., restlessness,  tiredness, shakiness, muscle tension).: No Description Entered (Status: improved).  Problems Addressed  Anxiety, Phase Of Life Problems, Anxiety  Goals 1. Learn and implement coping skills that result in a reduction of anxiety  and worry, and improved daily functioning. Objective Learn  and implement calming skills to reduce overall anxiety and manage anxiety symptoms. Target Date: 2025-08-09Frequency: Weekly Progress: 40 Modality: individual  Related Interventions 1. Teach the client calming/relaxation skills (e.g., applied relaxation, progressive muscle  relaxation, cue controlled relaxation; mindful breathing; biofeedback) and how to discriminate  better between relaxation and tension; teach the client how to apply these skills to his/her daily  life (e.g., New Directions in Progressive Muscle Relaxation by Marcelyn Ditty, and  Hazlett-Stevens; Treating Generalized Anxiety Disorder by Rygh and Ida Rogue). Objective Identify, challenge, and replace biased, fearful self-talk with positive, realistic, and empowering selftalk. Target Date: 2023-11-25 Frequency: weekly Progress: 30 Modality: individual Related Interventions 1. Explore the client's schema and self-talk that mediate his/her fear response; assist him/her in  challenging the biases; replace the distorted messages with reality-based alternatives and  positive, realistic self-talk that will increase his/her self-confidence in coping with irrational  fears (see Cognitive Therapy of Anxiety Disorders by Laurence Slate). Objective Learn and implement problem-solving strategies for realistically addressing worries. Target Date: 2025-08-09Frequency: weekly Progress: 40 Modality: individual 2. Resolve conflicted feelings and adapt to the new life circumstances. Objective Apply problem-solving skills to current circumstances. Target Date: 2023-11-25 Frequency: weekly Progress: 20 Modality: individual Related Interventions 1. Teach the client problem-resolution skills (e.g., defining the problem clearly, brainstorming  multiple solutions, listing the pros and cons of each solution, seeking input from others,  selecting and implementing a plan of action, evaluating outcome, and readjusting plan as   necessary).   3. Stabilize anxiety level while increasing ability to function on a daily  basis. Diagnosis F33.1  Major depressive disorder, moderate 300.02 (Generalized anxiety disorder) - Open - [Signifier: n/a]  Axis  none 309.28 (Adjustment disorder with mixed anxiety and depressed  mood) - Open - [Signifier: n/a]  Adjustment Disorder,  With Anxiety   Marital conflict  Major Depressive disorder, moderate  Conditions For Discharge Achievement of treatment goals and objectives.  The patient approved this plan.   Deonna Krummel G Ethridge Sollenberger, LCSW

## 2023-10-12 ENCOUNTER — Ambulatory Visit: Payer: Medicare PPO | Admitting: Physical Therapy

## 2023-10-12 DIAGNOSIS — R278 Other lack of coordination: Secondary | ICD-10-CM | POA: Diagnosis not present

## 2023-10-12 DIAGNOSIS — G20A1 Parkinson's disease without dyskinesia, without mention of fluctuations: Secondary | ICD-10-CM | POA: Diagnosis not present

## 2023-10-12 DIAGNOSIS — R2689 Other abnormalities of gait and mobility: Secondary | ICD-10-CM | POA: Diagnosis not present

## 2023-10-12 DIAGNOSIS — R269 Unspecified abnormalities of gait and mobility: Secondary | ICD-10-CM

## 2023-10-12 DIAGNOSIS — R262 Difficulty in walking, not elsewhere classified: Secondary | ICD-10-CM

## 2023-10-12 DIAGNOSIS — R296 Repeated falls: Secondary | ICD-10-CM

## 2023-10-12 DIAGNOSIS — R2681 Unsteadiness on feet: Secondary | ICD-10-CM | POA: Diagnosis not present

## 2023-10-12 DIAGNOSIS — M6281 Muscle weakness (generalized): Secondary | ICD-10-CM

## 2023-10-12 NOTE — Therapy (Signed)
 OUTPATIENT PHYSICAL THERAPY TREATMENT Physical Therapy Progress Note   Dates of reporting period  08/21/2023   to   10/12/2023    Patient Name: [name] MRN: [id number] DOB:17-Jan-1951, 73 y.o., male Today's Date: 10/12/2023   PCP: Sid Sieving, PA-C REFERRING PROVIDER: Jacumin, Erika Leigh, PA    END OF SESSION:     PT End of Session - 10/12/23 1319     Visit Number 30    Number of Visits 45    Date for PT Re-Evaluation 11/16/23    Authorization Type [employer] [id number]    Progress Note Due on Visit 30    PT Start Time 1319    PT Stop Time 1401    PT Time Calculation (min) 42 min    Equipment Utilized During Treatment Gait belt    Activity Tolerance Patient tolerated treatment well;No increased pain    Behavior During Therapy WFL for tasks assessed/performed             Past Medical History:  Diagnosis Date   Anxiety disorder    Arthritis    Bradycardia    Cancer (HCC) 2013   skin cancer   Depression    ptsd   Dysrhythmia    chronic slow heart rate   GERD (gastroesophageal reflux disease)    H/O tachycardia-bradycardia syndrome    Headache(784.0)    tension headaches non recent   History of chicken pox    History of kidney stones    passed   Hypertension    treated with HCTZ   Major depressive disorder    Mild aortic regurgitation    Pacemaker    Parkinson's disease (HCC)    dx'ed 15 years ago   PTSD (post-traumatic stress disorder)    S/P deep brain stimulator placement    Shortness of breath dyspnea    Sleep apnea    doesn't use C-pap   Unspecified dementia, moderate, with psychotic disturbance (HCC)    Varicose veins    Past Surgical History:  Procedure Laterality Date   CATARACT EXTRACTION W/PHACO Right 01/17/2023   Procedure: CATARACT EXTRACTION PHACO AND INTRAOCULAR LENS PLACEMENT (IOC) RIGHT 6.33 00:42.6;  Surgeon: Jaye Fallow, MD;  Location: [facility];  Service: Ophthalmology;  Laterality: Right;   CATARACT EXTRACTION  W/PHACO Left 02/07/2023   Procedure: CATARACT EXTRACTION PHACO AND INTRAOCULAR LENS PLACEMENT (IOC) LEFT 6.35 00:38.3;  Surgeon: Jaye Fallow, MD;  Location: [facility];  Service: Ophthalmology;  Laterality: Left;   CHOLECYSTECTOMY N/A 10/22/2014   Procedure: LAPAROSCOPIC CHOLECYSTECTOMY WITH INTRAOPERATIVE CHOLANGIOGRAM;  Surgeon: Elgin Laurence III, MD;  Location: [facility];  Service: General;  Laterality: N/A;   cyst removed      from lip as a child   INTRAMEDULLARY (IM) NAIL INTERTROCHANTERIC N/A 02/18/2019   Procedure: INTRAMEDULLARY (IM) NAIL INTERTROCHANTRIC, RIGHT,;  Surgeon: Marchia Drivers, MD;  Location: [facility];  Service: Orthopedics;  Laterality: N/A;   LUMBAR LAMINECTOMY/DECOMPRESSION MICRODISCECTOMY Bilateral 12/14/2012   Procedure: Bilateral lumbar three-four, four-five decompressive laminotomy/foraminotomy;  Surgeon: Victory DELENA Gunnels, MD;  Location: [facility];  Service: Neurosurgery;  Laterality: Bilateral;   PULSE GENERATOR IMPLANT Bilateral 12/13/2013   Procedure: Bilateral implantable pulse generator placement;  Surgeon: Fairy Levels, MD;  Location: [facility];  Service: Neurosurgery;  Laterality: Bilateral;  Bilateral implantable pulse generator placement   skin cancer removed     from ears,   12 lft arm  rt leg 15   SUBTHALAMIC STIMULATOR BATTERY REPLACEMENT Bilateral 07/14/2017   Procedure: BILATERAL  IMPLANTED PULSE GENERATOR CHANGE FOR DEEP BRAIN STIMULATOR;  Surgeon: Unice Pac, MD;  Location: [facility];  Service: Neurosurgery;  Laterality: Bilateral;   SUBTHALAMIC STIMULATOR INSERTION Bilateral 12/06/2013   Procedure: SUBTHALAMIC STIMULATOR INSERTION;  Surgeon: Pac Unice, MD;  Location: [facility];  Service: Neurosurgery;  Laterality: Bilateral;  Bilateral deep brain stimulator placement   Patient Active Problem List   Diagnosis Date Noted   Hematuria 10/10/2021   Sinus drainage 12/09/2020   Medicare annual wellness visit, subsequent 11/18/2020   Pacemaker     Dementia associated with Parkinson's disease (HCC) 06/17/2020   Vertigo 02/19/2020   Urine abnormality 02/19/2020   Dysphagia 02/23/2019   Fall at home 10/18/2017   REM behavioral disorder 11/02/2016   Radicular pain in right arm 10/09/2016   GERD (gastroesophageal reflux disease) 07/31/2016   Colon cancer screening 07/23/2015   Gout 02/18/2015   Depression 01/06/2015   Skin lesion 10/02/2014   Dupuytren's contracture 08/20/2014   Cough 07/21/2014   Lumbar stenosis with neurogenic claudication 06/13/2014   S/P deep brain stimulator placement 05/08/2014   Advance care planning 01/19/2014   Parkinson's disease (HCC) 12/13/2013   PTSD (post-traumatic stress disorder) 06/13/2013   Erectile dysfunction 06/13/2013   HLD (hyperlipidemia) 06/13/2013   Essential hypertension 06/03/2013   Bradycardia by electrocardiogram 06/03/2013   Obstructive sleep apnea 03/12/2013    ONSET DATE: ~15 years ago when pt was diagnosed with Parkinson's  REFERRING DIAG: G20.A1 (ICD-10-CM) - Parkinsons disease (HCC)   THERAPY DIAG:  Abnormality of gait and mobility  Unsteadiness on feet  Difficulty in walking, not elsewhere classified  Muscle weakness (generalized)  Other lack of coordination  Repeated falls  Other abnormalities of gait and mobility  Rationale for Evaluation and Treatment: Rehabilitation  SUBJECTIVE:                                                                                                                                                                                             SUBJECTIVE STATEMENT:  Pt reports he is doing OK. Reports no significant pain, but states it is just there. Reports his L 5th toe is flared up again, and he hasn't seen the MD yet about it. Reports he has had ~3 falls recently.     PERTINENT HISTORY: Parkinson's s/p deep brain stim, depression, HTN, GERD, clavicular fracture 2025, dementia, seizures/falls, LBP/PSF, hx of lumbar fusion in  04/2022, hx of hip fx 08/2022, arthritis, frequent falls, and OSA   PAIN:  Are you having pain? No  PRECAUTIONS: Fall and Other:   frequent falls, most recent on 05/22/2023 with ED visit, L superolateral clavicular fx (  out of sling on 06/15/23 due to pt/family discontinuing it), deep brain stimulator   WEIGHT BEARING RESTRICTIONS: Unknown   FALLS: Has patient fallen in last 6 months? Yes. Number of falls 10+   LIVING ENVIRONMENT: Lives with: lives with their spouse (wife, [name]) and has hired caregivers Lives in: House/apartment Stairs: Yes: External: 2-3 steps; bilateral but cannot reach both (due to current home renovations, this is the entrance he uses) primarily uses R HR Has following equipment at home: Wheelchair (manual), shower chair, Grab bars, and Up-Walker  PLOF: Independent with household mobility with device, Requires assistive device for independence, Needs assistance with ADLs, and meal prep  Pt reports he frequently leaves his AD to the side when going to get something, often forgetting it - he requires reinforced education on consistent use of U-step walker  PATIENT GOALS: Improve Balance  OBJECTIVE:  Note: Objective measures were completed at Evaluation unless otherwise noted.  DIAGNOSTIC FINDINGS:   CT CERVICAL SPINE WITHOUT CONTRAST on 05/22/2023 IMPRESSION: Disc levels: Disc space narrowing and spurring in the lower cervical spine. Mild bilateral degenerative facet disease, left greater than right. Degenerative disc and facet disease throughout the cervical spine. No acute bony abnormality.  CT HEAD WITHOUT CONTRAST on 05/22/2023 FINDINGS: Brain: Deep brain stimulators are noted with the tips in the region of the thalami bilaterally, unchanged. Mild atrophy. No acute intracranial abnormality. Specifically, no hemorrhage, hydrocephalus, mass lesion, acute infarction, or significant intracranial injury. Vascular: No hyperdense vessel or unexpected  calcification. Skull: No acute calvarial abnormality. Sinuses/Orbits: No acute findings Other: None IMPRESSION: No acute intracranial abnormality.  LEFT SHOULDER - 2+ VIEW on 05/22/2023 IMPRESSION: Acute mildly displaced fracture of the lateral aspect of the left clavicle with intra-articular extension.  COGNITION: Overall cognitive status: History of cognitive impairments - at baseline and pt with very delayed verbal responses to subjective questioning and noted to be easily distracted in the therapy gym environment  LOWER EXTREMITY MMT:    MMT Right Eval Left Eval  Hip flexion 4 4  Hip extension    Hip abduction    Hip adduction    Hip internal rotation    Hip external rotation    Knee flexion 4+ 5  Knee extension 4+ 4+  Ankle dorsiflexion 4- 4  Ankle plantarflexion 4 4-  Ankle inversion    Ankle eversion    (Blank rows = not tested)  BED MOBILITY:  Sit to supine Min A and Mod A Supine to sit Min A and Mod A Requires adjustable bed with bedrail at home   TRANSFERS: Assistive device utilized: Up-Walker  Sit to stand: SBA, CGA, and Min A Stand to sit: SBA, CGA, and Min A Chair to chair: CGA and Min A Floor: Not assessed  STAIRS: Level of Assistance: 06/12/23: Min A Stair Negotiation Technique: Forwards with Bilateral Rails  Number of Stairs: 4  Height of Stairs: 6inch  Comments: 06/12/23 - posterior LOB at top of steps when turning around, minor posterior lean throughout  GAIT: Gait pattern: step through pattern, decreased step length- Right, decreased step length- Left, decreased stride length, knee flexed in stance- Right, knee flexed in stance- Left, shuffling, festinating, trunk flexed, wide BOS, poor foot clearance- Right, and poor foot clearance- Left Distance walked: ~129ft Assistive device utilized: Up-Walker Level of assistance: CGA and Min A Comments: Noticeable festination when turning, stepping back to sit, or side stepping  FUNCTIONAL TESTS:  5  times sit to stand: 22 seconds Timed up and go (TUG): 06/12/23:  29.40 seconds using Up-Walker with CGA/min A 10 meter walk test: 0.63m/s Berg Balance Scale: 20/56  PATIENT SURVEYS:  ABC scale 06/12/23: 30%                                                                                                                             TREATMENT DATE:  10/12/23  Arrives to therapy in transport chair.  Gait training 139ft, no AD, with light min assist for balance and facilitating increased hip abductor activation for improved BOS to avoid excessively narrow BOS and improve balance stability during stance phase while providing pt with a functional warm-up.   Vitals in sitting after: BP 149/87 (MAP 104), HR 97bpm    Participated in Timed Up and Go (TUG): 1st trial: 16.33 seconds 2nd trial: 16.15 seconds  Average: 16.24 seconds using Up-Walker with close SBA for safety - pt with continued decreased safety awareness with use of AD requiring verbal cuing to improve Patient demonstrates high fall risk as indicated by requiring >13.5seconds to complete the TUG.   Participated in Timed Up and Go (TUG): 1st trial: 10.01 seconds 2nd trial: 10.72 seconds  Average: 10.365 seconds, no AD, with CGA for steadying and 1x mod A due to L anterior LOB when turning around Patient demonstrates high fall risk as indicated by requiring >13.5seconds to complete the TUG.    10 Meter Walk Test: Patient instructed to walk 10 meters (32.8 ft) as quickly and as safely as possible at their normal speed x2 and at a fast speed x2. Time measured from 2 meter mark to 8 meter mark to accommodate ramp-up and ramp-down.  Normal speed 1: 0.72 m/s (13.75 seconds) Normal speed 2: 0.68 m/s (14.60 seconds) Average Normal speed: 0.70 m/s using Up-Walker with CGA for safety Fast speed 1: 0.99 m/s (10.10 seconds) Fast speed 2: 0.78 m/s (12.80 seconds) Average Fast speed: 0.89 m/s using Up-Walker with CGA for safety Cut off scores:  <0.4 m/s = household Ambulator, 0.4-0.8 m/s = limited community Ambulator, >0.8 m/s = community Ambulator, >1.2 m/s = crossing a street, <1.0 = increased fall risk MCID 0.05 m/s (small), 0.13 m/s (moderate), 0.06 m/s (significant)  (ANPTA Core Set of Outcome Measures for Adults with Neurologic Conditions, 2018)   Patient participated in [place] Balance Test and demonstrates increased fall risk as noted by score of 36/56.  (<36= high risk for falls, close to 100%; 37-45 significant >80%; 46-51 moderate >50%; 52-55 lower >25%).   [facility] PT Assessment - 10/12/23 0001       Berg Balance Test   Sit to Stand Able to stand  independently using hands    Standing Unsupported Able to stand 2 minutes with supervision    Sitting with Back Unsupported but Feet Supported on Floor or Stool Able to sit safely and securely 2 minutes    Stand to Sit Sits safely with minimal use of hands    Transfers Able to transfer safely, definite need of  hands    Standing Unsupported with Eyes Closed Able to stand 10 seconds with supervision    Standing Unsupported with Feet Together Able to place feet together independently and stand for 1 minute with supervision    From Standing, Reach Forward with Outstretched Arm Can reach forward >5 cm safely (2)    From Standing Position, Pick up Object from Floor Able to pick up shoe, needs supervision    From Standing Position, Turn to Look Behind Over each Shoulder Turn sideways only but maintains balance    Turn 360 Degrees Able to turn 360 degrees safely but slowly    Standing Unsupported, Alternately Place Feet on Step/Stool Able to complete 4 steps without aid or supervision    Standing Unsupported, One Foot in Front Able to take small step independently and hold 30 seconds    Standing on One Leg Unable to try or needs assist to prevent fall    Total Score 36          Stair navigation training ascending/descending 4 steps (6 height) using B HRs with supervision/CGA for  safety - pt self-selecting reciprocal stepping pattern.   Throughout session, pt continues to demo decreased safety awareness with decreased anticipatory awareness of when a certain movement could cause LOB.    PATIENT EDUCATION: Education details: PT POC, LTGs, findings on balance assessments today Person educated: Patient and Caregiver Angelica Education method: Explanation Education comprehension: verbalized understanding and needs further education  HOME EXERCISE PROGRAM:   Access Code: [email or web address] URL: [email or web address] Date: 06/28/2023 Prepared by: Connell Kiss  Exercises - Walking  - 1 x daily - 7 x weekly - 2 sets - 3-5 minutes hold - Sit to Stand  - 1 x daily - 7 x weekly - 2 sets - 12-15 reps - Side Stepping with Resistance at Thighs and Counter Support  - 1 x daily - 7 x weekly - 3 sets - 10 reps - Supine Bridge with Resistance Band  - 1 x daily - 7 x weekly - 2 sets - 10-15 reps - Clamshell  - 1 x daily - 7 x weekly - 3 sets - 10 reps  GOALS: Goals reviewed with patient? Yes  SHORT TERM GOALS: Target date: 10/05/2023   Patient will be independent in home exercise program to improve strength/mobility for better functional independence with ADLs. Baseline: 06/20/2023: HEP provided 06/28/2023: updated 08/21/2023: pt inconsistent with compliance  10/11/2024: continues to have inconsistent compliance due to cognitive impairments  Goal status: ONGOING   2. Patient will report <2 falls over past 6 weeks to demonstrate improved safety awareness at home.  Baseline: 06/01/2023 = 10+ falls in past 6 months; 3/27: 10+ falls per patient report in past 6 weeks; 08/21/2023: at least 5+ falls in past 6 weeks, 6/26: reports ~3 falls in past week, unsure how many in past 6 weeks Goal Status: ONGOING  LONG TERM GOALS: Target date: 11/16/2023  1.  Patient (> 51 years old) will complete five times sit to stand test in < 15 seconds indicating an increased LE strength and  improved balance. Baseline: 06/01/23: 22 seconds with posterior LOB; 3/27: 11.51 seconds; 5/5: 12.17 seconds  Goal status: MET  2.  Patient will increase Berg Balance score by > 6 points to demonstrate decreased fall risk during functional activities. Baseline: 06/01/23: 20/56; 3/27: 23/56; 5/5: 34/56, 6/26: 36/56 Goal status: ONGOING   3. Patient will reduce timed up and go to <11 seconds to reduce fall risk and demonstrate improved  transfer/gait ability. Baseline: 06/12/23: 29.40 seconds using Up-Walker with CGA/min A; 3/27: 22.29 seconds with Up-walker and posterior LOB upon standing; 5/5: 16.13 seconds using Up-Walker with close SBA for safety, 6/26: 16.24 seconds using Up-Walker with close SBA  Goal status: ONGOING  4.Patient will increase 10 meter walk test to >1.24m/s as to improve gait speed for better community ambulation and to reduce fall risk. Baseline: 06/01/23: 0.73m/s using Up-Walker; 3/27: 0.89 m/s using Up Walker, 5/5: 0.928 m/s using Up-Walker (after warm-up), 6/26: Normal speed: 0.70 m/s & Fast speed: 0.89 m/s using Up-Walker with CGA  Goal status: ONGOING  5.  Patient will require no more than supervision assist with ascending/descending 3 steps using handrails per home set-up.  Baseline: 06/12/23: requires min A using HR due to posterior lean/LOB especially when turning at the top; 3/27: minA required using HR due to posterior LOB on first step; 5/5: supervision using bilateral HRs and self-selecting reciprocal pattern (no LOB), 6/26: supervision/CGA bilateral HRs and self-selecting reciprocal pattern  Goal status: MET   ASSESSMENT: CLINICAL IMPRESSION:   Therapy session focused on re-assessment of standardized outcome measures to determine pt's progress with therapy thus far. Patient demonstrates consistent results on TUG and stair navigation. Patient demonstrates slight improvement on Berg Balance test; however, remains in the high fall risk category and pt reports having ~3  falls in the past week. Patient does not demonstrate an improvement on the ; however, there were some additional distractions in the gym at the time of this testing, which may have impacted his walking speed. Patient recently responding well to integration of high intensity training of targeted interventions and will continue this in upcoming sessions. Patient will benefit from continued skilled physical therapy to further address his balance deficits in order to decrease his fall risk and promote increased independence with functional mobility and ADLs.  Patient's condition has the potential to improve in response to therapy. Maximum improvement is yet to be obtained. The anticipated improvement is attainable and reasonable in a generally predictable time.    OBJECTIVE IMPAIRMENTS: Abnormal gait, decreased activity tolerance, decreased balance, decreased cognition, decreased endurance, decreased knowledge of condition, decreased knowledge of use of DME, decreased mobility, difficulty walking, decreased strength, decreased safety awareness, hypomobility, impaired flexibility, impaired sensation, improper body mechanics, postural dysfunction, and pain.   ACTIVITY LIMITATIONS: carrying, lifting, bending, standing, squatting, sleeping, stairs, transfers, bed mobility, continence, bathing, toileting, dressing, reach over head, hygiene/grooming, and locomotion level  PARTICIPATION LIMITATIONS: meal prep, cleaning, laundry, medication management, driving, and community activity  PERSONAL FACTORS: Age, Sex, Time since onset of injury/illness/exacerbation, and 3+ comorbidities: Parkinson's, deep brain stim, depression, lipids, HTN, GERD, ED, dementia, seizures/falls, LBP/PSF, hx of lumbar fusion in 04/2022, hx of hip fx 08/2022, arthritis, frequent falls,  and OSA are also affecting patient's functional outcome.   REHAB POTENTIAL: Good  CLINICAL DECISION MAKING: Evolving/moderate complexity  EVALUATION  COMPLEXITY: Moderate  PLAN:  PT FREQUENCY: 1-2x/week  PT DURATION: 12 weeks  PLANNED INTERVENTIONS: 97164- PT Re-evaluation, 97110-Therapeutic exercises, 97530- Therapeutic activity, 97112- Neuromuscular re-education, 97535- Self Care, [id number]- Manual therapy, [phone]- Gait training, [phone]- Orthotic Fit/training, [phone]- Electrical stimulation (manual), Patient/Family education, Balance training, Stair training, Taping, Joint mobilization, Joint manipulation, Spinal mobilization, Vestibular training, Cognitive remediation, DME instructions, Cryotherapy, and Moist heat  PLAN FOR NEXT SESSION:     - continue high intensity interval training - try tall kneeling hip extension? - sit<>stands with weighted back-pack - continue standing hip abduction strengthening   - continue gait training with  variable stepping and reaching    Connell Kiss, PT, DPT, NCS, CSRS Physical Therapist - [place]  [facility]  2:06 PM 10/12/23

## 2023-10-16 ENCOUNTER — Ambulatory Visit: Payer: Medicare PPO | Admitting: Physical Therapy

## 2023-10-19 ENCOUNTER — Ambulatory Visit: Payer: Medicare PPO | Admitting: Physical Therapy

## 2023-10-23 ENCOUNTER — Ambulatory Visit: Payer: Medicare PPO | Admitting: Physical Therapy

## 2023-10-26 ENCOUNTER — Ambulatory Visit: Payer: Medicare PPO | Admitting: Physical Therapy

## 2023-10-30 ENCOUNTER — Ambulatory Visit: Payer: Medicare PPO | Admitting: Physical Therapy

## 2023-11-02 ENCOUNTER — Ambulatory Visit: Payer: Medicare PPO | Admitting: Physical Therapy

## 2023-11-06 ENCOUNTER — Ambulatory Visit: Payer: Medicare PPO | Admitting: Physical Therapy

## 2023-11-09 ENCOUNTER — Ambulatory Visit: Payer: Medicare PPO | Admitting: Physical Therapy

## 2023-11-13 ENCOUNTER — Ambulatory Visit: Payer: Medicare PPO | Admitting: Physical Therapy

## 2023-11-15 ENCOUNTER — Ambulatory Visit: Admitting: Cardiology

## 2023-11-16 ENCOUNTER — Ambulatory Visit: Payer: Medicare PPO | Admitting: Physical Therapy

## 2023-11-20 ENCOUNTER — Ambulatory Visit: Payer: Self-pay

## 2023-11-21 ENCOUNTER — Ambulatory Visit (INDEPENDENT_AMBULATORY_CARE_PROVIDER_SITE_OTHER): Admitting: Psychology

## 2023-11-21 DIAGNOSIS — F331 Major depressive disorder, recurrent, moderate: Secondary | ICD-10-CM | POA: Diagnosis not present

## 2023-11-21 DIAGNOSIS — F4323 Adjustment disorder with mixed anxiety and depressed mood: Secondary | ICD-10-CM

## 2023-11-21 NOTE — Progress Notes (Unsigned)
 [facility] Counselor/Therapist Progress Note  Patient ID: [name], MRN: [id number],    Date: 11/21/2023  Time Spent: 51 minutes  Time in: 4:07   Time out:  4:58  Treatment Type: Indivdual  Reported Symptoms: sadness, and boredom  Mental Status Exam: Appearance:  Casual     Behavior: normal  Motor: Psychomotor Retardation  Speech/Language:  Slow  Affect: flat  Mood: pleasant  Thought process: concrete  Thought content:   WNL  Sensory/Perceptual disturbances:   WNL  Orientation: oriented to person, place, time/date, and situation  Attention: Fair  Concentration: Fair  Memory: Immediate;   Fair  Progress Energy of knowledge:  Fair  Insight:   Fair  Judgment:  Fair  Impulse Control: Fair   Risk Assessment: Danger to Self:  No Self-injurious Behavior: No Danger to Others: No Duty to Warn:no Physical Aggression / Violence:No  Access to Firearms a concern: No  Gang Involvement:No   Subjective: The patient attended a face-to-face individual therapy session  via video visit today.  The patient gave verbal consent for the video to be on Caregility and his wife is aware of limitations of telehealth.  The patient was in his home  and therapist was in the office.  The patient presents with a flat affect and mood is pleasant.  The patient and his wife report he has been following more.  They said that that he fell this week and had to go to the emergency room at the [facility].  The patient and his wife state that their roommate is moving out and they seem to be okay with this.  The patient continues to have issues when someone else has to do something for him because he cannot do it for himself.  Mostly I provided supportive therapy to him and validation.  We talked about them going on a trip for their anniversary this month.  Provided supportive therapy and we are scheduled again for 6 weeks.  Interventions: Ego-Supportive, CBT, Social skills training, problem  solving  Diagnosis:Major depressive disorder, recurrent episode, moderate (HCC)  Adjustment disorder with mixed anxiety and depressed mood  Plan: Client Abilities/Strengths  Pt is bright and has a good sense of humor.  Client Treatment Preferences  Individual therapy.  Client Statement of Needs  improve coping skills regarding chronic illness.  Treatment Level  Outpatient Individual therapy  Symptoms  Depressed or irritable mood.: No Description Entered (Status: improved). Diminished interest in or  enjoyment of activities.: No Description Entered (Status: maintained). Feelings of hopelessness,  worthlessness, or inappropriate guilt.: No Description Entered (Status: improved). Lack of energy.: No  Description Entered (Status: maintained). Psychomotor agitation or retardation.: No Description  Entered (Status: maintained). Social withdrawal.: No Description Entered (Status: improved).  Problems Addressed  Unipolar Depression, Unipolar Depression, Unipolar Depression, Unipolar Depression  Goals 1. Alleviate depressive symptoms and return to previous level of effective  functioning. 2. Develop healthy interpersonal relationships that lead to the alleviation  and help prevent the relapse of depression. Objective Describe current and past experiences with depression including their impact on functioning and  attempts to resolve it. Target Date: 2024-04-30 Frequency: Monthly Progress: 60 Modality: individual Related Interventions 1. Encourage the client to share his/her thoughts and feelings of depression; express empathy and  build rapport while identifying primary cognitive, behavioral, interpersonal, or other  contributors to depression. Objective Learn and implement behavioral strategies to overcome depression. Target Date: 2024-04-30 Frequency: Monthly Progress: 80 Modality: individual Objective Identify and replace thoughts and beliefs that support  depression. Target Date:  2024-04-30 Frequency: Monthly Progress: 70 Modality: individual Related Interventions 1. Conduct Cognitive-Behavioral Therapy (see Cognitive Behavior Therapy by Almarie; Overcoming Depression by Marine dunker al.), beginning with helping the client learn the connection among  cognition, depressive feelings, and actions. 2. Explore and restructure underlying assumptions and beliefs reflected in biased self-talk that  may put the client at risk for relapse or recurrence. 3. Facilitate and reinforce the client's shift from biased depressive self-talk and beliefs to realitybased cognitive messages that enhance self-confidence and increase adaptive actions (see  Positive Self-Talk in the Adult Psychotherapy Homework Planner by Jenniffer). Objective Learn and implement problem-solving and decision-making skills. Target Date: 2024-04-30 Frequency: Monthly Progress: 70 Modality: individual Related Interventions 1. Encourage in the client the development of a positive problem orientation in which problems  and solving them are viewed as a natural part of life and not something to be feared, despaired,  or avoided. 2. Conduct Problem-Solving Therapy (see Problem-Solving Therapy by Francisco and Nezu) using techniques such as psychoeducation, modeling, and role-playing to teach client problem-solving skills (i.e., defining a problem specifically, generating possible solutions, evaluating the pros  and cons of each solution, selecting and implementing a plan of action, evaluating the efficacy  of the plan, accepting or revising the plan); role-play application of the problem-solving skill to  a real life issue (or assign Applying Problem-Solving to Interpersonal Conflict in the Adult  Psychotherapy Homework Planner by Jenniffer). 3. Develop healthy thinking patterns and beliefs about self, others, and the world that lead to the alleviation and help prevent the relapse of  depression. 4. Recognize, accept,  and cope with feelings of depression. Diagnosis Axis  none F43.21 (Adjustment Disorder, With depressed mood) - Open -  [Signifier: n/a]  Adjustment Disorder,  With Depressed Mood  Axis  none 296.32 (Major depressive affective disorder, recurrent episode,  moderate) - Open - [Signifier: n/a]  Conditions For Discharge Achievement of treatment goals and objectives   Plan is to continue to offer supportive therapy to patient to help him deal with his depression related to his Parkinsons diagnosis.  Patient approved Treatment Plan   Mahlik Lenn KANDICE Macintosh, LCSW

## 2023-11-22 ENCOUNTER — Ambulatory Visit: Attending: Physician Assistant

## 2023-11-22 DIAGNOSIS — R2689 Other abnormalities of gait and mobility: Secondary | ICD-10-CM | POA: Insufficient documentation

## 2023-11-22 DIAGNOSIS — R2681 Unsteadiness on feet: Secondary | ICD-10-CM | POA: Insufficient documentation

## 2023-11-22 DIAGNOSIS — M6281 Muscle weakness (generalized): Secondary | ICD-10-CM | POA: Insufficient documentation

## 2023-11-22 DIAGNOSIS — R278 Other lack of coordination: Secondary | ICD-10-CM | POA: Insufficient documentation

## 2023-11-22 DIAGNOSIS — R262 Difficulty in walking, not elsewhere classified: Secondary | ICD-10-CM | POA: Insufficient documentation

## 2023-11-22 DIAGNOSIS — R269 Unspecified abnormalities of gait and mobility: Secondary | ICD-10-CM | POA: Insufficient documentation

## 2023-11-22 DIAGNOSIS — R296 Repeated falls: Secondary | ICD-10-CM | POA: Insufficient documentation

## 2023-11-22 DIAGNOSIS — G20A1 Parkinson's disease without dyskinesia, without mention of fluctuations: Secondary | ICD-10-CM | POA: Insufficient documentation

## 2023-11-23 NOTE — Progress Notes (Signed)
 Cardiology Office Note   Date:  11/24/2023  ID:  ALEKSI BRUMMET, DOB 1950/09/17, MRN [id number] PCP: Sid Toribio RIGGERS  [facility] Providers Cardiologist:  Evalene Lunger, MD Electrophysiologist:  OLE ONEIDA HOLTS, MD   History of Present Illness AADVIK Mcgee is a 73 y.o. male HTN, tachy-brady syndrome s/p PPM, HLD, depression, Parkinson's associated orthostasis who presents for overdue follow-up.   Patient underwent pacemaker implant at [facility] in 2022 for tachybradycardia syndrome.  He then transferred care to Dr. HOLTS.  Echo in 2022 showed pulm function 55 to 60%, mild AI.  Patient was last seen 02/2022 and was stable from a cardiac perspective.  Today, the patient reports he has been falling a lot. He may fall 3 times a day. He went to the ER Sunday for a fall and injured his elbows. He does have a walker, but oftentimes forgets to use it. Sometimes when he falls he feels dizzy and lightheaded. He sometimes falls due to leg weakness or tripping over something. He normally falls backwards. He denies chest pain or SOB. Hands seem to stay swollen. Wife reports they have transferred care of the PPM back to the [facility]. EKG shows NSR.   Studies Reviewed EKG Interpretation Date/Time:  Friday November 24 2023 14:49:03 EDT Ventricular Rate:  62 PR Interval:    QRS Duration:  70 QT Interval:  396 QTC Calculation: 401 R Axis:   -39  Text Interpretation: Normal sinus rhythm Left axis deviation Minimal voltage criteria for LVH, may be normal variant ( R in aVL ) Inferior infarct , age undetermined Anterior infarct , age undetermined When compared with ECG of 22-Dec-2021 14:32, PREVIOUS ECG IS PRESENT Confirmed by Franchester, Jerrik Housholder ([id number]) on 11/24/2023 3:52:41 PM    Echo 2022  1. Left ventricular ejection fraction, by estimation, is 55 to 60%. The  left ventricle has normal function. The left ventricle has no regional  wall motion abnormalities. Left ventricular diastolic parameters  were  normal.   2. Right ventricular systolic function is normal. The right ventricular  size is normal.   3. The mitral valve is normal in structure. Trivial mitral valve  regurgitation.   4. The aortic valve is tricuspid. Aortic valve regurgitation is mild.       Physical Exam VS:  BP 130/70 (BP Location: Left Arm, Patient Position: Sitting, Cuff Size: Normal)   Pulse 62   Ht 5' 8 (1.727 m)   Wt 181 lb 3.2 oz (82.2 kg)   SpO2 97%   BMI 27.55 kg/m   Orthostatic VS for the past 24 hrs (Last 3 readings):  BP- Lying Pulse- Lying BP- Sitting Pulse- Sitting BP- Standing at 0 minutes Pulse- Standing at 0 minutes  11/24/23 1516 136/79 96 122/70 96 132/73 99  11/24/23 1513 136/77 96 122/79 96 -- --      Wt Readings from Last 3 Encounters:  11/24/23 181 lb 3.2 oz (82.2 kg)  05/22/23 183 lb 11.2 oz (83.3 kg)  02/07/23 180 lb (81.6 kg)    GEN: Well nourished, well developed in no acute distress NECK: No JVD; No carotid bruits CARDIAC: RRR, no murmurs, rubs, gallops RESPIRATORY:  Clear to auscultation without rales, wheezing or rhonchi  ABDOMEN: Soft, non-tender, non-distended EXTREMITIES:  No edema; No deformity   ASSESSMENT AND PLAN  Orthostasis Falls Wife reports multiple falls, sometimes in a day. He has parkinson's and has known autonomic dysfunction/orthostasis, and has a walker. He is supposed to use his walker all the time,  but sometimes oftentimes does not use it. He sometimes falls due to dizziness and lightheadedness, but sometimes it's a mechanical fall or from leg weakness. He denies palpitations or heart racing. No chest pain or SOB. Blood pressures today are good, not low. Unable to check orthostatics today standing since he is so unstable at baseline. There was a drop from laying to sitting, but not positive. He is not on any BP lowering medications. WE discussed using his walker ALL the time. I also recommended liberalizing salt, good hydration, thigh high stockings and  abdominal binder. He is on ASA, but will hold due to frequent falls.   Tachy-Brady syndrome s/p PPM This is now followed at the [facility] again. EKG shows NSR, HR 62bpm.     Dispo: Follow-up in 1 year with MD  Signed, Bucky Grigg VEAR Fishman, PA-C

## 2023-11-24 ENCOUNTER — Ambulatory Visit: Attending: Medical | Admitting: Medical

## 2023-11-24 ENCOUNTER — Encounter: Payer: Self-pay | Admitting: Medical

## 2023-11-24 VITALS — BP 130/70 | HR 62 | Ht 68.0 in | Wt 181.2 lb

## 2023-11-24 DIAGNOSIS — I10 Essential (primary) hypertension: Secondary | ICD-10-CM

## 2023-11-24 DIAGNOSIS — I495 Sick sinus syndrome: Secondary | ICD-10-CM

## 2023-11-24 NOTE — Patient Instructions (Signed)
 Medication Instructions:  The current medical regimen is effective;  continue present plan and medications as directed. Please refer to the Current Medication list given to you today.   *If you need a refill on your cardiac medications before your next appointment, please call your pharmacy*  Follow-Up: At [facility], you and your health needs are our priority.  As part of our continuing mission to provide you with exceptional heart care, our providers are all part of one team.  This team includes your primary Cardiologist (physician) and Advanced Practice Providers or APPs (Physician Assistants and Nurse Practitioners) who all work together to provide you with the care you need, when you need it.  Your next appointment:   12 month(s)  Provider:   Timothy Gollan, MD    We recommend signing up for the patient portal called "MyChart".  Sign up information is provided on this After Visit Summary.  MyChart is used to connect with patients for Virtual Visits (Telemedicine).  Patients are able to view lab/test results, encounter notes, upcoming appointments, etc.  Non-urgent messages can be sent to your provider as well.   To learn more about what you can do with MyChart, go to [email or web address].

## 2023-11-30 ENCOUNTER — Ambulatory Visit: Admitting: Physical Therapy

## 2023-11-30 DIAGNOSIS — R262 Difficulty in walking, not elsewhere classified: Secondary | ICD-10-CM | POA: Diagnosis not present

## 2023-11-30 DIAGNOSIS — M6281 Muscle weakness (generalized): Secondary | ICD-10-CM | POA: Diagnosis not present

## 2023-11-30 DIAGNOSIS — R278 Other lack of coordination: Secondary | ICD-10-CM

## 2023-11-30 DIAGNOSIS — R2681 Unsteadiness on feet: Secondary | ICD-10-CM

## 2023-11-30 DIAGNOSIS — R2689 Other abnormalities of gait and mobility: Secondary | ICD-10-CM

## 2023-11-30 DIAGNOSIS — R296 Repeated falls: Secondary | ICD-10-CM

## 2023-11-30 DIAGNOSIS — G20A1 Parkinson's disease without dyskinesia, without mention of fluctuations: Secondary | ICD-10-CM | POA: Diagnosis not present

## 2023-11-30 DIAGNOSIS — R269 Unspecified abnormalities of gait and mobility: Secondary | ICD-10-CM

## 2023-11-30 NOTE — Therapy (Signed)
 OUTPATIENT PHYSICAL THERAPY EVALUATION   Patient Name: [name] MRN: [id number] DOB:1950-10-17, 73 y.o., male Today's Date: 11/30/2023   PCP: Sid Sieving, PA-C REFERRING PROVIDER: Jacumin, Erika Leigh, PA    END OF SESSION:     PT End of Session - 11/30/23 1517     Visit Number 1    Number of Visits 24    Date for PT Re-Evaluation 02/22/24    PT Start Time 1535    PT Stop Time 1615    PT Time Calculation (min) 40 min    Equipment Utilized During Treatment Gait belt    Activity Tolerance Patient tolerated treatment well;No increased pain    Behavior During Therapy WFL for tasks assessed/performed             Past Medical History:  Diagnosis Date   Anxiety disorder    Arthritis    Bradycardia    Cancer (HCC) 2013   skin cancer   Depression    ptsd   Dysrhythmia    chronic slow heart rate   GERD (gastroesophageal reflux disease)    H/O tachycardia-bradycardia syndrome    Headache(784.0)    tension headaches non recent   History of chicken pox    History of kidney stones    passed   Hypertension    treated with HCTZ   Major depressive disorder    Mild aortic regurgitation    Pacemaker    Parkinson's disease (HCC)    dx'ed 15 years ago   PTSD (post-traumatic stress disorder)    S/P deep brain stimulator placement    Shortness of breath dyspnea    Sleep apnea    doesn't use C-pap   Unspecified dementia, moderate, with psychotic disturbance (HCC)    Varicose veins    Past Surgical History:  Procedure Laterality Date   CATARACT EXTRACTION W/PHACO Right 01/17/2023   Procedure: CATARACT EXTRACTION PHACO AND INTRAOCULAR LENS PLACEMENT (IOC) RIGHT 6.33 00:42.6;  Surgeon: Jaye Fallow, MD;  Location: [facility];  Service: Ophthalmology;  Laterality: Right;   CATARACT EXTRACTION W/PHACO Left 02/07/2023   Procedure: CATARACT EXTRACTION PHACO AND INTRAOCULAR LENS PLACEMENT (IOC) LEFT 6.35 00:38.3;  Surgeon: Jaye Fallow, MD;   Location: [facility];  Service: Ophthalmology;  Laterality: Left;   CHOLECYSTECTOMY N/A 10/22/2014   Procedure: LAPAROSCOPIC CHOLECYSTECTOMY WITH INTRAOPERATIVE CHOLANGIOGRAM;  Surgeon: Elgin Laurence III, MD;  Location: [facility];  Service: General;  Laterality: N/A;   cyst removed      from lip as a child   INTRAMEDULLARY (IM) NAIL INTERTROCHANTERIC N/A 02/18/2019   Procedure: INTRAMEDULLARY (IM) NAIL INTERTROCHANTRIC, RIGHT,;  Surgeon: Marchia Drivers, MD;  Location: [facility];  Service: Orthopedics;  Laterality: N/A;   LUMBAR LAMINECTOMY/DECOMPRESSION MICRODISCECTOMY Bilateral 12/14/2012   Procedure: Bilateral lumbar three-four, four-five decompressive laminotomy/foraminotomy;  Surgeon: Victory DELENA Gunnels, MD;  Location: [facility];  Service: Neurosurgery;  Laterality: Bilateral;   PULSE GENERATOR IMPLANT Bilateral 12/13/2013   Procedure: Bilateral implantable pulse generator placement;  Surgeon: Fairy Levels, MD;  Location: [facility];  Service: Neurosurgery;  Laterality: Bilateral;  Bilateral implantable pulse generator placement   skin cancer removed     from ears,   12 lft arm  rt leg 15   SUBTHALAMIC STIMULATOR BATTERY REPLACEMENT Bilateral 07/14/2017   Procedure: BILATERAL IMPLANTED PULSE GENERATOR CHANGE FOR DEEP BRAIN STIMULATOR;  Surgeon: Levels Fairy, MD;  Location: [facility];  Service: Neurosurgery;  Laterality: Bilateral;   SUBTHALAMIC STIMULATOR INSERTION Bilateral 12/06/2013   Procedure: SUBTHALAMIC STIMULATOR  INSERTION;  Surgeon: Fairy Levels, MD;  Location: [facility];  Service: Neurosurgery;  Laterality: Bilateral;  Bilateral deep brain stimulator placement   Patient Active Problem List   Diagnosis Date Noted   Hematuria 10/10/2021   Sinus drainage 12/09/2020   Medicare annual wellness visit, subsequent 11/18/2020   Pacemaker    Dementia associated with Parkinson's disease (HCC) 06/17/2020   Vertigo 02/19/2020   Urine abnormality 02/19/2020   Dysphagia 02/23/2019   Fall at  home 10/18/2017   REM behavioral disorder 11/02/2016   Radicular pain in right arm 10/09/2016   GERD (gastroesophageal reflux disease) 07/31/2016   Colon cancer screening 07/23/2015   Gout 02/18/2015   Depression 01/06/2015   Skin lesion 10/02/2014   Dupuytren's contracture 08/20/2014   Cough 07/21/2014   Lumbar stenosis with neurogenic claudication 06/13/2014   S/P deep brain stimulator placement 05/08/2014   Advance care planning 01/19/2014   Parkinson's disease (HCC) 12/13/2013   PTSD (post-traumatic stress disorder) 06/13/2013   Erectile dysfunction 06/13/2013   HLD (hyperlipidemia) 06/13/2013   Essential hypertension 06/03/2013   Bradycardia by electrocardiogram 06/03/2013   Obstructive sleep apnea 03/12/2013    ONSET DATE: ~15 years ago when pt was diagnosed with Parkinson's  REFERRING DIAG: G20.A1 (ICD-10-CM) - Parkinsons disease (HCC)   THERAPY DIAG:  Abnormality of gait and mobility  Unsteadiness on feet  Difficulty in walking, not elsewhere classified  Muscle weakness (generalized)  Other lack of coordination  Repeated falls  Other abnormalities of gait and mobility  Rationale for Evaluation and Treatment: Rehabilitation  SUBJECTIVE:                                                                                                                                                                                             SUBJECTIVE STATEMENT:  Pt reports that he is doing okay this afternoon. Returns to PT after 2 month hiatus and increased frequency of falls.  Reports that he only has pain when he gets out of bed in the morning that only lasts for a few minutes.   States that he has been falling more frequently. At least 6-8 times in the last 2 weeks. With 3 falls reported by by wife last Monday. States that the He fell hard 2 weeks ago and was seen at [facility]. All images were negative. No fx. No internal or cerebral damage noted per wife's report.  State  that it has been a While since he has seen PD specialist.    PERTINENT HISTORY: Parkinson's s/p deep brain stim, depression, HTN, GERD, clavicular fracture 2025, dementia, seizures/falls, LBP/PSF, hx of lumbar fusion in 04/2022,  hx of hip fx 08/2022, arthritis, frequent falls, and OSA   PAIN:  Are you having pain? No  PRECAUTIONS: Fall and Other:   frequent falls, deep brain stimulator   WEIGHT BEARING RESTRICTIONS: Unknown   FALLS: Has patient fallen in last 6 months? Yes. Number of falls 10+   LIVING ENVIRONMENT: Lives with: lives with their spouse (wife, [name]) and has hired caregivers Lives in: House/apartment Stairs: Yes: External: 2-3 steps; bilateral but cannot reach both (due to current home renovations, this is the entrance he uses) primarily uses R HR Has following equipment at home: Wheelchair (manual), shower chair, Grab bars, and Up-Walker  PLOF: Independent with household mobility with device, Requires assistive device for independence, Needs assistance with ADLs, and meal prep  Pt reports he frequently leaves his AD to the side when going to get something, often forgetting it - he requires reinforced education on consistent use of U-step walker  PATIENT GOALS: Improve Balance  OBJECTIVE:  Note: Objective measures were completed at Evaluation unless otherwise noted.  DIAGNOSTIC FINDINGS:   CT CERVICAL SPINE WITHOUT CONTRAST on 05/22/2023 IMPRESSION: Disc levels: Disc space narrowing and spurring in the lower cervical spine. Mild bilateral degenerative facet disease, left greater than right. Degenerative disc and facet disease throughout the cervical spine. No acute bony abnormality.  CT HEAD WITHOUT CONTRAST on 05/22/2023 FINDINGS: Brain: Deep brain stimulators are noted with the tips in the region of the thalami bilaterally, unchanged. Mild atrophy. No acute intracranial abnormality. Specifically, no hemorrhage, hydrocephalus, mass lesion, acute infarction,  or significant intracranial injury. Vascular: No hyperdense vessel or unexpected calcification. Skull: No acute calvarial abnormality. Sinuses/Orbits: No acute findings Other: None IMPRESSION: No acute intracranial abnormality.  LEFT SHOULDER - 2+ VIEW on 05/22/2023 IMPRESSION: Acute mildly displaced fracture of the lateral aspect of the left clavicle with intra-articular extension.  COGNITION: Overall cognitive status: History of cognitive impairments - at baseline and pt with very delayed verbal responses to subjective questioning and noted to be easily distracted in the therapy gym environment  LOWER EXTREMITY MMT:   8/14 MMT Right Eval Left Eval  Hip flexion 4 4  Hip extension 4 4  Hip abduction    Hip adduction    Hip internal rotation    Hip external rotation    Knee flexion 4+ 4+  Knee extension 4+ 4+  Ankle dorsiflexion 4+ 4+  Ankle plantarflexion    Ankle inversion    Ankle eversion    (Blank rows = not tested)  BED MOBILITY:  Sit to supine Min A and Mod A Supine to sit Min A and Mod A Requires adjustable bed with bedrail at home   TRANSFERS: Assistive device utilized: Up-Walker  Sit to stand: SBA, CGA, and Min A Stand to sit: SBA, CGA, and Min A Chair to chair: CGA and Min A Floor: Not assessed  STAIRS: Not assessed on 8/15.    From prior PT treatment:  Level of Assistance: 06/12/23: Min A Stair Negotiation Technique: Forwards with Bilateral Rails  Number of Stairs: 4  Height of Stairs: 6inch  Comments: 06/12/23 - posterior LOB at top of steps when turning around, minor posterior lean throughout  GAIT: Gait pattern: step through pattern, decreased step length- Right, decreased step length- Left, decreased stride length, knee flexed in stance- Right, knee flexed in stance- Left, shuffling, festinating, trunk flexed, wide BOS, poor foot clearance- Right, and poor foot clearance- Left Distance walked: ~127ft Assistive device utilized: Up-Walker Level  of assistance: CGA and Min  A Comments: Noticeable festination when turning, stepping back to sit, or side stepping  FUNCTIONAL TESTS:   5 times sit to stand: 23.5s Timed up and go (TUG): 32.8 sec with 4WW   10 meter walk test: 0.53m/s with 4WW  Berg Balance Scale: no assessed on 8/14  PATIENT SURVEYS:  ABC scale 06/12/23: 30%                                                                                                                             TREATMENT DATE:  11/30/23  Arrives to therapy in transport chair.  Gait training 195ft with upright walker CGA for safety and cues for improved positioning of 4ww and safety in turns as well as improved posture and step height.    Pt performed 5 time sit<>stand (5xSTS): 23.5s sec (>15 sec indicates increased fall risk)    Participated in Timed Up and Go (TUG): 1st trial:  with upright RW 32.8 sec  2nd trial: without AD 22.94 sec moderate knee buckling on the LLE intermittently    10 Meter Walk Test: Patient instructed to walk 10 meters (32.8 ft) as quickly and as safely as possible at their normal speed x2 and at a fast speed x2. Time measured from 2 meter mark to 8 meter mark to accommodate ramp-up and ramp-down.  With up walking  Normal speed 1:  23.86 Normal speed 2: 23.40 Average Normal speed: 0.4 with up walker  Without AD  Fast speed 1:  22.8sec Fast speed 2:  23.7sec  Average speed:0.41m/s without AD   Cut off scores: <0.4 m/s = household Ambulator, 0.4-0.8 m/s = limited community Ambulator, >0.8 m/s = community Ambulator, >1.2 m/s = crossing a street, <1.0 = increased fall risk MCID 0.05 m/s (small), 0.13 m/s (moderate), 0.06 m/s (significant)  (ANPTA Core Set of Outcome Measures for Adults with Neurologic Conditions, 2018)    PATIENT EDUCATION: Education details: PT POC, LTGs, findings on balance assessments today Person educated: Patient and Caregiver Angelica Education method: Explanation Education comprehension:  verbalized understanding and needs further education  HOME EXERCISE PROGRAM:  (From prior PT episode) Access Code: [email or web address] URL: [email or web address] Date: 06/28/2023 Prepared by: Connell Kiss  Exercises - Walking  - 1 x daily - 7 x weekly - 2 sets - 3-5 minutes hold - Sit to Stand  - 1 x daily - 7 x weekly - 2 sets - 12-15 reps - Side Stepping with Resistance at Thighs and Counter Support  - 1 x daily - 7 x weekly - 3 sets - 10 reps - Supine Bridge with Resistance Band  - 1 x daily - 7 x weekly - 2 sets - 10-15 reps - Clamshell  - 1 x daily - 7 x weekly - 3 sets - 10 reps  GOALS: Goals reviewed with patient? Yes  SHORT TERM GOALS: Target date: 12/28/2023    Patient will be independent in home exercise program to improve strength/mobility for better functional  independence with ADLs. Baseline: non compliant Goal status: INITIAL   2. Patient will report <2 falls over past 6 weeks to demonstrate improved safety awareness at home.  Baseline: > 3 per week  Goal Status: INITIAL   LONG TERM GOALS: Target date: 11/16/2023  1.  Patient (> 61 years old) will complete five times sit to stand test in < 15 seconds indicating an increased LE strength and improved balance. Baseline: 23.5s Goal status: INITIAL  2.  Patient will increase Berg Balance score by > 6 points to demonstrate decreased fall risk during functional activities. Baseline:TBD Goal status: INITIAL    3. Patient will reduce timed up and go to <11 seconds to reduce fall risk and demonstrate improved transfer/gait ability. Baseline:33 sec with 4WW Goal status: INITIAL   4.Patient will increase 10 meter walk test to >1.59m/s as to improve gait speed for better community ambulation and to reduce fall risk. Baseline: 0.74m/s with 4WW Goal status: INITIAL   5.  Patient will require no more than supervision assist with ascending/descending 3 steps using handrails per home set-up.  Baseline: to be assessed    Goal status: INITIAL    ASSESSMENT: CLINICAL IMPRESSION:   Pt returns to PT after 2 month hiatus. Reports significantly more frequent falls >3 per week. Noted decrease in BLE strength as as well increased fall risk with decreased gait speed, increased time on TUG, increased 5x STS,decreased safety with AD and mobility with constant knee buckling and tripping on BLE due to narrow gait with shuffling pattern.   Patient will benefit from continued skilled physical therapy to further address his balance deficits in order to decrease his fall risk and promote increased independence with functional mobility and ADLs.     OBJECTIVE IMPAIRMENTS: Abnormal gait, decreased activity tolerance, decreased balance, decreased cognition, decreased endurance, decreased knowledge of condition, decreased knowledge of use of DME, decreased mobility, difficulty walking, decreased strength, decreased safety awareness, hypomobility, impaired flexibility, impaired sensation, improper body mechanics, postural dysfunction, and pain.   ACTIVITY LIMITATIONS: carrying, lifting, bending, standing, squatting, sleeping, stairs, transfers, bed mobility, continence, bathing, toileting, dressing, reach over head, hygiene/grooming, and locomotion level  PARTICIPATION LIMITATIONS: meal prep, cleaning, laundry, medication management, driving, and community activity  PERSONAL FACTORS: Age, Sex, Time since onset of injury/illness/exacerbation, and 3+ comorbidities: Parkinson's, deep brain stim, depression, lipids, HTN, GERD, ED, dementia, seizures/falls, LBP/PSF, hx of lumbar fusion in 04/2022, hx of hip fx 08/2022, arthritis, frequent falls,  and OSA are also affecting patient's functional outcome.   REHAB POTENTIAL: Good  CLINICAL DECISION MAKING: Evolving/moderate complexity  EVALUATION COMPLEXITY: Moderate  PLAN:  PT FREQUENCY: 1-2x/week  PT DURATION: 12 weeks  PLANNED INTERVENTIONS: 97164- PT Re-evaluation,  97110-Therapeutic exercises, 97530- Therapeutic activity, 97112- Neuromuscular re-education, 97535- Self Care, [id number]- Manual therapy, [phone]- Gait training, [phone]- Orthotic Fit/training, [phone]- Electrical stimulation (manual), Patient/Family education, Balance training, Stair training, Taping, Joint mobilization, Joint manipulation, Spinal mobilization, Vestibular training, Cognitive remediation, DME instructions, Cryotherapy, and Moist heat  PLAN FOR NEXT SESSION:      Re-assess berg and add goal as appropriate PD management with improved step width/length/high Large amplitude movement training.   Massie Dollar PT, DPT  Physical Therapist - [place]  [facility]  3:29 PM 11/30/23

## 2023-12-04 ENCOUNTER — Ambulatory Visit

## 2023-12-04 ENCOUNTER — Encounter: Payer: Self-pay | Admitting: Physical Therapy

## 2023-12-04 DIAGNOSIS — G20A1 Parkinson's disease without dyskinesia, without mention of fluctuations: Secondary | ICD-10-CM | POA: Diagnosis not present

## 2023-12-04 DIAGNOSIS — R2681 Unsteadiness on feet: Secondary | ICD-10-CM

## 2023-12-04 DIAGNOSIS — R269 Unspecified abnormalities of gait and mobility: Secondary | ICD-10-CM | POA: Diagnosis not present

## 2023-12-04 DIAGNOSIS — M6281 Muscle weakness (generalized): Secondary | ICD-10-CM | POA: Diagnosis not present

## 2023-12-04 DIAGNOSIS — R262 Difficulty in walking, not elsewhere classified: Secondary | ICD-10-CM

## 2023-12-04 DIAGNOSIS — R278 Other lack of coordination: Secondary | ICD-10-CM | POA: Diagnosis not present

## 2023-12-04 DIAGNOSIS — R2689 Other abnormalities of gait and mobility: Secondary | ICD-10-CM | POA: Diagnosis not present

## 2023-12-04 DIAGNOSIS — R296 Repeated falls: Secondary | ICD-10-CM | POA: Diagnosis not present

## 2023-12-04 NOTE — Therapy (Signed)
 OUTPATIENT PHYSICAL THERAPY TREATMENT   Patient Name: [name] MRN: [id number] DOB:[birth year], 72 y.o., male Today's Date: 12/04/2023   PCP: Sid Sieving, PA-C REFERRING PROVIDER: Jacumin, Erika Leigh, PA    END OF SESSION:     PT End of Session - 12/04/23 1057     Visit Number 2    Number of Visits 24    Date for PT Re-Evaluation 02/22/24    PT Start Time 1100    PT Stop Time 1141    PT Time Calculation (min) 41 min    Equipment Utilized During Treatment Gait belt    Activity Tolerance Patient tolerated treatment well;No increased pain    Behavior During Therapy WFL for tasks assessed/performed              Past Medical History:  Diagnosis Date   Anxiety disorder    Arthritis    Bradycardia    Cancer (HCC) 2013   skin cancer   Depression    ptsd   Dysrhythmia    chronic slow heart rate   GERD (gastroesophageal reflux disease)    H/O tachycardia-bradycardia syndrome    Headache(784.0)    tension headaches non recent   History of chicken pox    History of kidney stones    passed   Hypertension    treated with HCTZ   Major depressive disorder    Mild aortic regurgitation    Pacemaker    Parkinson's disease (HCC)    dx'ed 15 years ago   PTSD (post-traumatic stress disorder)    S/P deep brain stimulator placement    Shortness of breath dyspnea    Sleep apnea    doesn't use C-pap   Unspecified dementia, moderate, with psychotic disturbance (HCC)    Varicose veins    Past Surgical History:  Procedure Laterality Date   CATARACT EXTRACTION W/PHACO Right 01/17/2023   Procedure: CATARACT EXTRACTION PHACO AND INTRAOCULAR LENS PLACEMENT (IOC) RIGHT 6.33 00:42.6;  Surgeon: Jaye Fallow, MD;  Location: [facility];  Service: Ophthalmology;  Laterality: Right;   CATARACT EXTRACTION W/PHACO Left 02/07/2023   Procedure: CATARACT EXTRACTION PHACO AND INTRAOCULAR LENS PLACEMENT (IOC) LEFT 6.35 00:38.3;  Surgeon: Jaye Fallow, MD;   Location: [facility];  Service: Ophthalmology;  Laterality: Left;   CHOLECYSTECTOMY N/A 10/22/2014   Procedure: LAPAROSCOPIC CHOLECYSTECTOMY WITH INTRAOPERATIVE CHOLANGIOGRAM;  Surgeon: Elgin Laurence III, MD;  Location: [facility];  Service: General;  Laterality: N/A;   cyst removed      from lip as a child   INTRAMEDULLARY (IM) NAIL INTERTROCHANTERIC N/A 02/18/2019   Procedure: INTRAMEDULLARY (IM) NAIL INTERTROCHANTRIC, RIGHT,;  Surgeon: Marchia Drivers, MD;  Location: [facility];  Service: Orthopedics;  Laterality: N/A;   LUMBAR LAMINECTOMY/DECOMPRESSION MICRODISCECTOMY Bilateral 12/14/2012   Procedure: Bilateral lumbar three-four, four-five decompressive laminotomy/foraminotomy;  Surgeon: Victory DELENA Gunnels, MD;  Location: [facility];  Service: Neurosurgery;  Laterality: Bilateral;   PULSE GENERATOR IMPLANT Bilateral 12/13/2013   Procedure: Bilateral implantable pulse generator placement;  Surgeon: Fairy Levels, MD;  Location: [facility];  Service: Neurosurgery;  Laterality: Bilateral;  Bilateral implantable pulse generator placement   skin cancer removed     from ears,   12 lft arm  rt leg 15   SUBTHALAMIC STIMULATOR BATTERY REPLACEMENT Bilateral 07/14/2017   Procedure: BILATERAL IMPLANTED PULSE GENERATOR CHANGE FOR DEEP BRAIN STIMULATOR;  Surgeon: Levels Fairy, MD;  Location: [facility];  Service: Neurosurgery;  Laterality: Bilateral;   SUBTHALAMIC STIMULATOR INSERTION Bilateral 12/06/2013   Procedure: SUBTHALAMIC  STIMULATOR INSERTION;  Surgeon: Fairy Levels, MD;  Location: [facility];  Service: Neurosurgery;  Laterality: Bilateral;  Bilateral deep brain stimulator placement   Patient Active Problem List   Diagnosis Date Noted   Hematuria 10/10/2021   Sinus drainage 12/09/2020   Medicare annual wellness visit, subsequent 11/18/2020   Pacemaker    Dementia associated with Parkinson's disease (HCC) 06/17/2020   Vertigo 02/19/2020   Urine abnormality 02/19/2020   Dysphagia 02/23/2019   Fall at  home 10/18/2017   REM behavioral disorder 11/02/2016   Radicular pain in right arm 10/09/2016   GERD (gastroesophageal reflux disease) 07/31/2016   Colon cancer screening 07/23/2015   Gout 02/18/2015   Depression 01/06/2015   Skin lesion 10/02/2014   Dupuytren's contracture 08/20/2014   Cough 07/21/2014   Lumbar stenosis with neurogenic claudication 06/13/2014   S/P deep brain stimulator placement 05/08/2014   Advance care planning 01/19/2014   Parkinson's disease (HCC) 12/13/2013   PTSD (post-traumatic stress disorder) 06/13/2013   Erectile dysfunction 06/13/2013   HLD (hyperlipidemia) 06/13/2013   Essential hypertension 06/03/2013   Bradycardia by electrocardiogram 06/03/2013   Obstructive sleep apnea 03/12/2013    ONSET DATE: ~15 years ago when pt was diagnosed with Parkinson's  REFERRING DIAG: G20.A1 (ICD-10-CM) - Parkinsons disease (HCC)   THERAPY DIAG:  Abnormality of gait and mobility  Unsteadiness on feet  Difficulty in walking, not elsewhere classified  Muscle weakness (generalized)  Other lack of coordination  Rationale for Evaluation and Treatment: Rehabilitation  SUBJECTIVE:                                                                                                                                                                                             SUBJECTIVE STATEMENT:  Wife reports no falls since last session.   From eval, states that he has been falling more frequently. At least 6-8 times in the last 2 weeks. With 3 falls reported by by wife last Monday. States that the He fell hard 2 weeks ago and was seen at [facility]. All images were negative. No fx. No internal or cerebral damage noted per wife's report.  State that it has been a While since he has seen PD specialist.   PERTINENT HISTORY: Parkinson's s/p deep brain stim, depression, HTN, GERD, clavicular fracture 2025, dementia, seizures/falls, LBP/PSF, hx of lumbar fusion in 04/2022,  hx of hip fx 08/2022, arthritis, frequent falls, and OSA   PAIN:  Are you having pain? No  PRECAUTIONS: Fall and Other:   frequent falls, deep brain stimulator   WEIGHT BEARING RESTRICTIONS: Unknown   FALLS: Has patient fallen in last  6 months? Yes. Number of falls 10+   LIVING ENVIRONMENT: Lives with: lives with their spouse (wife, [name]) and has hired caregivers Lives in: House/apartment Stairs: Yes: External: 2-3 steps; bilateral but cannot reach both (due to current home renovations, this is the entrance he uses) primarily uses R HR Has following equipment at home: Wheelchair (manual), shower chair, Grab bars, and Up-Tyyne Cliett  PLOF: Independent with household mobility with device, Requires assistive device for independence, Needs assistance with ADLs, and meal prep  Pt reports he frequently leaves his AD to the side when going to get something, often forgetting it - he requires reinforced education on consistent use of U-step Misty Foutz  PATIENT GOALS: Improve Balance  OBJECTIVE:  Note: Objective measures were completed at Evaluation unless otherwise noted.  DIAGNOSTIC FINDINGS:   CT CERVICAL SPINE WITHOUT CONTRAST on 05/22/2023 IMPRESSION: Disc levels: Disc space narrowing and spurring in the lower cervical spine. Mild bilateral degenerative facet disease, left greater than right. Degenerative disc and facet disease throughout the cervical spine. No acute bony abnormality.  CT HEAD WITHOUT CONTRAST on 05/22/2023 FINDINGS: Brain: Deep brain stimulators are noted with the tips in the region of the thalami bilaterally, unchanged. Mild atrophy. No acute intracranial abnormality. Specifically, no hemorrhage, hydrocephalus, mass lesion, acute infarction, or significant intracranial injury. Vascular: No hyperdense vessel or unexpected calcification. Skull: No acute calvarial abnormality. Sinuses/Orbits: No acute findings Other: None IMPRESSION: No acute intracranial  abnormality.  LEFT SHOULDER - 2+ VIEW on 05/22/2023 IMPRESSION: Acute mildly displaced fracture of the lateral aspect of the left clavicle with intra-articular extension.  COGNITION: Overall cognitive status: History of cognitive impairments - at baseline and pt with very delayed verbal responses to subjective questioning and noted to be easily distracted in the therapy gym environment  LOWER EXTREMITY MMT:   8/14 MMT Right Eval Left Eval  Hip flexion 4 4  Hip extension 4 4  Hip abduction    Hip adduction    Hip internal rotation    Hip external rotation    Knee flexion 4+ 4+  Knee extension 4+ 4+  Ankle dorsiflexion 4+ 4+  Ankle plantarflexion    Ankle inversion    Ankle eversion    (Blank rows = not tested)  BED MOBILITY:  Sit to supine Min A and Mod A Supine to sit Min A and Mod A Requires adjustable bed with bedrail at home   TRANSFERS: Assistive device utilized: Up-Nathyn Luiz  Sit to stand: SBA, CGA, and Min A Stand to sit: SBA, CGA, and Min A Chair to chair: CGA and Min A Floor: Not assessed  STAIRS: Not assessed on 8/15.    From prior PT treatment:  Level of Assistance: 06/12/23: Min A Stair Negotiation Technique: Forwards with Bilateral Rails  Number of Stairs: 4  Height of Stairs: 6inch  Comments: 06/12/23 - posterior LOB at top of steps when turning around, minor posterior lean throughout  GAIT: Gait pattern: step through pattern, decreased step length- Right, decreased step length- Left, decreased stride length, knee flexed in stance- Right, knee flexed in stance- Left, shuffling, festinating, trunk flexed, wide BOS, poor foot clearance- Right, and poor foot clearance- Left Distance walked: ~112ft Assistive device utilized: Up-Marlissa Emerick Level of assistance: CGA and Min A Comments: Noticeable festination when turning, stepping back to sit, or side stepping  FUNCTIONAL TESTS:   5 times sit to stand: 23.5s Timed up and go (TUG): 32.8 sec with 4WW   10 meter  walk test: 0.55m/s with 5TT  Lars Balance  Scale: no assessed on 8/14  PATIENT SURVEYS:  ABC scale 06/12/23: 30%                                                                                                                             TREATMENT DATE:  12/04/23    Arrives to therapy in transport chair.  BERG: 23/56 (36/56 on 10/12/23)  Gait training 179ft with upright Saber Dickerman CGA for safety and cues for improved positioning of 4ww and safety in turns as well as improved posture and step height.   Sit to stand from green chair x 10 with frequent retropulsion requiring modA to prevent uncontrolled descent.   Gait training x 300' with upright Everett Ricciardelli, CGA and 2.5# AW on each LE - cues for increasing step length   Sit to stand from green chair x 10 with no retropulsion noted   Seated march with 2.5# AW x 15 each LE   Sidestepping 6' x 4 with 2.5#AW with minA for balance and no AD   FWD/BWD walking 6' x 6 with 2.5# AW with CGA and no AD   Standing marching 2.5# AW x 15 each LE   PATIENT EDUCATION: Education details: PT POC, LTGs, findings on balance assessments today Person educated: Patient and Caregiver Angelica Education method: Explanation Education comprehension: verbalized understanding and needs further education  HOME EXERCISE PROGRAM:  (From prior PT episode) Access Code: [email or web address] URL: [email or web address] Date: 06/28/2023 Prepared by: Connell Kiss  Exercises - Walking  - 1 x daily - 7 x weekly - 2 sets - 3-5 minutes hold - Sit to Stand  - 1 x daily - 7 x weekly - 2 sets - 12-15 reps - Side Stepping with Resistance at Thighs and Counter Support  - 1 x daily - 7 x weekly - 3 sets - 10 reps - Supine Bridge with Resistance Band  - 1 x daily - 7 x weekly - 2 sets - 10-15 reps - Clamshell  - 1 x daily - 7 x weekly - 3 sets - 10 reps  GOALS: Goals reviewed with patient? Yes  SHORT TERM GOALS: Target date: 12/28/2023    Patient will be independent in home  exercise program to improve strength/mobility for better functional independence with ADLs. Baseline: non compliant Goal status: INITIAL   2. Patient will report <2 falls over past 6 weeks to demonstrate improved safety awareness at home.  Baseline: > 3 per week  Goal Status: INITIAL   LONG TERM GOALS: Target date: 11/16/2023  1.  Patient (> 6 years old) will complete five times sit to stand test in < 15 seconds indicating an increased LE strength and improved balance. Baseline: 23.5s Goal status: INITIAL  2.  Patient will increase Berg Balance score by > 6 points to demonstrate decreased fall risk during functional activities. Baseline: 23/56 Goal status: INITIAL    3. Patient will reduce timed up and go to <11 seconds to reduce fall risk and  demonstrate improved transfer/gait ability. Baseline:33 sec with 4WW Goal status: INITIAL   4.Patient will increase 10 meter walk test to >1.84m/s as to improve gait speed for better community ambulation and to reduce fall risk. Baseline: 0.58m/s with 4WW Goal status: INITIAL   5.  Patient will require no more than supervision assist with ascending/descending 3 steps using handrails per home set-up.  Baseline: to be assessed   Goal status: INITIAL    ASSESSMENT: CLINICAL IMPRESSION:    Patient arrives in transport chair. Completed BERG with patient scoring 23/56 which is a 13 point difference from discharge in 09/2023. Session focused on resisted gait training and dynamic balance with patient requiring up to modA for balance. Patient will benefit from continued skilled physical therapy to further address his balance deficits in order to decrease his fall risk and promote increased independence with functional mobility and ADLs.     OBJECTIVE IMPAIRMENTS: Abnormal gait, decreased activity tolerance, decreased balance, decreased cognition, decreased endurance, decreased knowledge of condition, decreased knowledge of use of DME, decreased  mobility, difficulty walking, decreased strength, decreased safety awareness, hypomobility, impaired flexibility, impaired sensation, improper body mechanics, postural dysfunction, and pain.   ACTIVITY LIMITATIONS: carrying, lifting, bending, standing, squatting, sleeping, stairs, transfers, bed mobility, continence, bathing, toileting, dressing, reach over head, hygiene/grooming, and locomotion level  PARTICIPATION LIMITATIONS: meal prep, cleaning, laundry, medication management, driving, and community activity  PERSONAL FACTORS: Age, Sex, Time since onset of injury/illness/exacerbation, and 3+ comorbidities: Parkinson's, deep brain stim, depression, lipids, HTN, GERD, ED, dementia, seizures/falls, LBP/PSF, hx of lumbar fusion in 04/2022, hx of hip fx 08/2022, arthritis, frequent falls,  and OSA are also affecting patient's functional outcome.   REHAB POTENTIAL: Good  CLINICAL DECISION MAKING: Evolving/moderate complexity  EVALUATION COMPLEXITY: Moderate  PLAN:  PT FREQUENCY: 1-2x/week  PT DURATION: 12 weeks  PLANNED INTERVENTIONS: 97164- PT Re-evaluation, 97110-Therapeutic exercises, 97530- Therapeutic activity, 97112- Neuromuscular re-education, 97535- Self Care, [id number]- Manual therapy, [phone]- Gait training, [phone]- Orthotic Fit/training, [phone]- Electrical stimulation (manual), Patient/Family education, Balance training, Stair training, Taping, Joint mobilization, Joint manipulation, Spinal mobilization, Vestibular training, Cognitive remediation, DME instructions, Cryotherapy, and Moist heat  PLAN FOR NEXT SESSION:      Re-assess berg and add goal as appropriate PD management with improved step width/length/high Large amplitude movement training.   Maryanne Finder, PT, DPT Physical Therapist - [facility]  11:57 AM 12/04/23

## 2023-12-07 ENCOUNTER — Ambulatory Visit

## 2023-12-07 DIAGNOSIS — R269 Unspecified abnormalities of gait and mobility: Secondary | ICD-10-CM

## 2023-12-07 DIAGNOSIS — R296 Repeated falls: Secondary | ICD-10-CM | POA: Diagnosis not present

## 2023-12-07 DIAGNOSIS — R2689 Other abnormalities of gait and mobility: Secondary | ICD-10-CM | POA: Diagnosis not present

## 2023-12-07 DIAGNOSIS — G20A1 Parkinson's disease without dyskinesia, without mention of fluctuations: Secondary | ICD-10-CM

## 2023-12-07 DIAGNOSIS — M6281 Muscle weakness (generalized): Secondary | ICD-10-CM | POA: Diagnosis not present

## 2023-12-07 DIAGNOSIS — R2681 Unsteadiness on feet: Secondary | ICD-10-CM

## 2023-12-07 DIAGNOSIS — R278 Other lack of coordination: Secondary | ICD-10-CM

## 2023-12-07 DIAGNOSIS — R262 Difficulty in walking, not elsewhere classified: Secondary | ICD-10-CM | POA: Diagnosis not present

## 2023-12-07 NOTE — Therapy (Signed)
 OUTPATIENT PHYSICAL THERAPY TREATMENT   Patient Name: [name] MRN: [id number] DOB:06-11-1950, 73 y.o., male Today's Date: 12/07/2023   PCP: Sid Sieving, PA-C REFERRING PROVIDER: Jacumin, Erika Leigh, PA    END OF SESSION:     PT End of Session - 12/07/23 1059     Visit Number 3    Number of Visits 24    Date for PT Re-Evaluation 02/22/24    PT Start Time 1100    PT Stop Time 1143    PT Time Calculation (min) 43 min    Equipment Utilized During Treatment Gait belt    Activity Tolerance Patient tolerated treatment well;No increased pain    Behavior During Therapy WFL for tasks assessed/performed               Past Medical History:  Diagnosis Date   Anxiety disorder    Arthritis    Bradycardia    Cancer (HCC) 2013   skin cancer   Depression    ptsd   Dysrhythmia    chronic slow heart rate   GERD (gastroesophageal reflux disease)    H/O tachycardia-bradycardia syndrome    Headache(784.0)    tension headaches non recent   History of chicken pox    History of kidney stones    passed   Hypertension    treated with HCTZ   Major depressive disorder    Mild aortic regurgitation    Pacemaker    Parkinson's disease (HCC)    dx'ed 15 years ago   PTSD (post-traumatic stress disorder)    S/P deep brain stimulator placement    Shortness of breath dyspnea    Sleep apnea    doesn't use C-pap   Unspecified dementia, moderate, with psychotic disturbance (HCC)    Varicose veins    Past Surgical History:  Procedure Laterality Date   CATARACT EXTRACTION W/PHACO Right 01/17/2023   Procedure: CATARACT EXTRACTION PHACO AND INTRAOCULAR LENS PLACEMENT (IOC) RIGHT 6.33 00:42.6;  Surgeon: Jaye Fallow, MD;  Location: [facility];  Service: Ophthalmology;  Laterality: Right;   CATARACT EXTRACTION W/PHACO Left 02/07/2023   Procedure: CATARACT EXTRACTION PHACO AND INTRAOCULAR LENS PLACEMENT (IOC) LEFT 6.35 00:38.3;  Surgeon: Jaye Fallow, MD;   Location: [facility];  Service: Ophthalmology;  Laterality: Left;   CHOLECYSTECTOMY N/A 10/22/2014   Procedure: LAPAROSCOPIC CHOLECYSTECTOMY WITH INTRAOPERATIVE CHOLANGIOGRAM;  Surgeon: Elgin Laurence III, MD;  Location: [facility];  Service: General;  Laterality: N/A;   cyst removed      from lip as a child   INTRAMEDULLARY (IM) NAIL INTERTROCHANTERIC N/A 02/18/2019   Procedure: INTRAMEDULLARY (IM) NAIL INTERTROCHANTRIC, RIGHT,;  Surgeon: Marchia Drivers, MD;  Location: [facility];  Service: Orthopedics;  Laterality: N/A;   LUMBAR LAMINECTOMY/DECOMPRESSION MICRODISCECTOMY Bilateral 12/14/2012   Procedure: Bilateral lumbar three-four, four-five decompressive laminotomy/foraminotomy;  Surgeon: Victory DELENA Gunnels, MD;  Location: [facility];  Service: Neurosurgery;  Laterality: Bilateral;   PULSE GENERATOR IMPLANT Bilateral 12/13/2013   Procedure: Bilateral implantable pulse generator placement;  Surgeon: Fairy Levels, MD;  Location: [facility];  Service: Neurosurgery;  Laterality: Bilateral;  Bilateral implantable pulse generator placement   skin cancer removed     from ears,   12 lft arm  rt leg 15   SUBTHALAMIC STIMULATOR BATTERY REPLACEMENT Bilateral 07/14/2017   Procedure: BILATERAL IMPLANTED PULSE GENERATOR CHANGE FOR DEEP BRAIN STIMULATOR;  Surgeon: Levels Fairy, MD;  Location: [facility];  Service: Neurosurgery;  Laterality: Bilateral;   SUBTHALAMIC STIMULATOR INSERTION Bilateral 12/06/2013   Procedure:  SUBTHALAMIC STIMULATOR INSERTION;  Surgeon: Fairy Levels, MD;  Location: [facility];  Service: Neurosurgery;  Laterality: Bilateral;  Bilateral deep brain stimulator placement   Patient Active Problem List   Diagnosis Date Noted   Hematuria 10/10/2021   Sinus drainage 12/09/2020   Medicare annual wellness visit, subsequent 11/18/2020   Pacemaker    Dementia associated with Parkinson's disease (HCC) 06/17/2020   Vertigo 02/19/2020   Urine abnormality 02/19/2020   Dysphagia 02/23/2019   Fall at  home 10/18/2017   REM behavioral disorder 11/02/2016   Radicular pain in right arm 10/09/2016   GERD (gastroesophageal reflux disease) 07/31/2016   Colon cancer screening 07/23/2015   Gout 02/18/2015   Depression 01/06/2015   Skin lesion 10/02/2014   Dupuytren's contracture 08/20/2014   Cough 07/21/2014   Lumbar stenosis with neurogenic claudication 06/13/2014   S/P deep brain stimulator placement 05/08/2014   Advance care planning 01/19/2014   Parkinson's disease (HCC) 12/13/2013   PTSD (post-traumatic stress disorder) 06/13/2013   Erectile dysfunction 06/13/2013   HLD (hyperlipidemia) 06/13/2013   Essential hypertension 06/03/2013   Bradycardia by electrocardiogram 06/03/2013   Obstructive sleep apnea 03/12/2013    ONSET DATE: ~15 years ago when pt was diagnosed with Parkinson's  REFERRING DIAG: G20.A1 (ICD-10-CM) - Parkinsons disease (HCC)   THERAPY DIAG:  Abnormality of gait and mobility  Unsteadiness on feet  Difficulty in walking, not elsewhere classified  Muscle weakness (generalized)  Other lack of coordination  Repeated falls  Other abnormalities of gait and mobility  Parkinson's disease without dyskinesia or fluctuating manifestations (HCC)  Rationale for Evaluation and Treatment: Rehabilitation  SUBJECTIVE:                                                                                                                                                                                             SUBJECTIVE STATEMENT:  Wife requests teaching his caregiver some exercises that they can do at home.   From eval, states that he has been falling more frequently. At least 6-8 times in the last 2 weeks. With 3 falls reported by by wife last Monday. States that the He fell hard 2 weeks ago and was seen at [facility]. All images were negative. No fx. No internal or cerebral damage noted per wife's report.  State that it has been a While since he has seen PD  specialist.   PERTINENT HISTORY: Parkinson's s/p deep brain stim, depression, HTN, GERD, clavicular fracture 2025, dementia, seizures/falls, LBP/PSF, hx of lumbar fusion in 04/2022, hx of hip fx 08/2022, arthritis, frequent falls, and OSA   PAIN:  Are you having pain? No  PRECAUTIONS: Fall and Other:   frequent falls, deep brain stimulator   WEIGHT BEARING RESTRICTIONS: Unknown   FALLS: Has patient fallen in last 6 months? Yes. Number of falls 10+   LIVING ENVIRONMENT: Lives with: lives with their spouse (wife, [name]) and has hired caregivers Lives in: House/apartment Stairs: Yes: External: 2-3 steps; bilateral but cannot reach both (due to current home renovations, this is the entrance he uses) primarily uses R HR Has following equipment at home: Wheelchair (manual), shower chair, Grab bars, and Up-Walker  PLOF: Independent with household mobility with device, Requires assistive device for independence, Needs assistance with ADLs, and meal prep  Pt reports he frequently leaves his AD to the side when going to get something, often forgetting it - he requires reinforced education on consistent use of U-step walker  PATIENT GOALS: Improve Balance  OBJECTIVE:  Note: Objective measures were completed at Evaluation unless otherwise noted.  DIAGNOSTIC FINDINGS:   CT CERVICAL SPINE WITHOUT CONTRAST on 05/22/2023 IMPRESSION: Disc levels: Disc space narrowing and spurring in the lower cervical spine. Mild bilateral degenerative facet disease, left greater than right. Degenerative disc and facet disease throughout the cervical spine. No acute bony abnormality.  CT HEAD WITHOUT CONTRAST on 05/22/2023 FINDINGS: Brain: Deep brain stimulators are noted with the tips in the region of the thalami bilaterally, unchanged. Mild atrophy. No acute intracranial abnormality. Specifically, no hemorrhage, hydrocephalus, mass lesion, acute infarction, or significant intracranial  injury. Vascular: No hyperdense vessel or unexpected calcification. Skull: No acute calvarial abnormality. Sinuses/Orbits: No acute findings Other: None IMPRESSION: No acute intracranial abnormality.  LEFT SHOULDER - 2+ VIEW on 05/22/2023 IMPRESSION: Acute mildly displaced fracture of the lateral aspect of the left clavicle with intra-articular extension.  COGNITION: Overall cognitive status: History of cognitive impairments - at baseline and pt with very delayed verbal responses to subjective questioning and noted to be easily distracted in the therapy gym environment  LOWER EXTREMITY MMT:   8/14 MMT Right Eval Left Eval  Hip flexion 4 4  Hip extension 4 4  Hip abduction    Hip adduction    Hip internal rotation    Hip external rotation    Knee flexion 4+ 4+  Knee extension 4+ 4+  Ankle dorsiflexion 4+ 4+  Ankle plantarflexion    Ankle inversion    Ankle eversion    (Blank rows = not tested)  BED MOBILITY:  Sit to supine Min A and Mod A Supine to sit Min A and Mod A Requires adjustable bed with bedrail at home   TRANSFERS: Assistive device utilized: Up-Walker  Sit to stand: SBA, CGA, and Min A Stand to sit: SBA, CGA, and Min A Chair to chair: CGA and Min A Floor: Not assessed  STAIRS: Not assessed on 8/15.    From prior PT treatment:  Level of Assistance: 06/12/23: Min A Stair Negotiation Technique: Forwards with Bilateral Rails  Number of Stairs: 4  Height of Stairs: 6inch  Comments: 06/12/23 - posterior LOB at top of steps when turning around, minor posterior lean throughout  GAIT: Gait pattern: step through pattern, decreased step length- Right, decreased step length- Left, decreased stride length, knee flexed in stance- Right, knee flexed in stance- Left, shuffling, festinating, trunk flexed, wide BOS, poor foot clearance- Right, and poor foot clearance- Left Distance walked: ~173ft Assistive device utilized: Up-Walker Level of assistance: CGA and Min  A Comments: Noticeable festination when turning, stepping back to sit, or side stepping  FUNCTIONAL TESTS:   5 times  sit to stand: 23.5s Timed up and go (TUG): 32.8 sec with 4WW   10 meter walk test: 0.48m/s with 4WW  Berg Balance Scale: no assessed on 8/14  PATIENT SURVEYS:  ABC scale 06/12/23: 30%                                                                                                                             TREATMENT DATE:  12/07/23    Arrives to therapy in transport chair.  TA:   Sit to stand from green chair  2 x 10 with frequent retropulsion requiring modA to prevent uncontrolled descent.   Ambulation x 300' with upright walker, CGA and 5# AW on each LE - cues for increasing step length and to look ahead - No LOB   Self care/Home management: Instructed patient and paid caregiver in the following and reviewed/issued handout Standing march with 5# AW x 15 reps each LE  Standing Hip abd with 5# AW x 15 reps each LE Standing hip ext with 5# AW x 15 reps each LE Standing Ham curl with 5# AW x 15 reps each LE Standing toe/heel raises with 5# AW x 15 reps BLE Step up with 5# AW x 15 reps   PATIENT EDUCATION: Education details: PT POC, LTGs, findings on balance assessments today Person educated: Patient and Caregiver Angelica Education method: Explanation Education comprehension: verbalized understanding and needs further education  HOME EXERCISE PROGRAM:   Access Code: [email or web address] URL: [email or web address] Date: 12/07/2023 Prepared by: Reyes London  Exercises - Sit to Stand with Arms Crossed  - 3 x weekly - 3 sets - 10 reps - Standing March with Counter Support  - 3 x weekly - 3 sets - 10 reps - Standing Hip Abduction with Counter Support  - 3 x weekly - 3 sets - 10 reps - Standing Knee Flexion with Counter Support  - 3 x weekly - 3 sets - 10 reps - Standing Hip Extension with Counter Support  - 3 x weekly - 3 sets - 10 reps - Heel Toe  Raises with Unilateral Counter Support  - 3 x weekly - 3 sets - 10 reps - Forward Step Up with Counter Support  - 3 x weekly - 3 sets - 10 reps     (From prior PT episode) Access Code: [email or web address] URL: [email or web address] Date: 06/28/2023 Prepared by: Connell Kiss  Exercises - Walking  - 1 x daily - 7 x weekly - 2 sets - 3-5 minutes hold - Sit to Stand  - 1 x daily - 7 x weekly - 2 sets - 12-15 reps - Side Stepping with Resistance at Thighs and Counter Support  - 1 x daily - 7 x weekly - 3 sets - 10 reps - Supine Bridge with Resistance Band  - 1 x daily - 7 x weekly - 2 sets - 10-15 reps - Clamshell  - 1 x daily - 7 x weekly - 3 sets -  10 reps  GOALS: Goals reviewed with patient? Yes  SHORT TERM GOALS: Target date: 12/28/2023    Patient will be independent in home exercise program to improve strength/mobility for better functional independence with ADLs. Baseline: non compliant Goal status: INITIAL   2. Patient will report <2 falls over past 6 weeks to demonstrate improved safety awareness at home.  Baseline: > 3 per week  Goal Status: INITIAL   LONG TERM GOALS: Target date: 11/16/2023  1.  Patient (> 76 years old) will complete five times sit to stand test in < 15 seconds indicating an increased LE strength and improved balance. Baseline: 23.5s Goal status: INITIAL  2.  Patient will increase Berg Balance score by > 6 points to demonstrate decreased fall risk during functional activities. Baseline: 23/56 Goal status: INITIAL    3. Patient will reduce timed up and go to <11 seconds to reduce fall risk and demonstrate improved transfer/gait ability. Baseline:33 sec with 4WW Goal status: INITIAL   4.Patient will increase 10 meter walk test to >1.15m/s as to improve gait speed for better community ambulation and to reduce fall risk. Baseline: 0.50m/s with 4WW Goal status: INITIAL   5.  Patient will require no more than supervision assist with  ascending/descending 3 steps using handrails per home set-up.  Baseline: to be assessed   Goal status: INITIAL    ASSESSMENT: CLINICAL IMPRESSION:    Patient wife requested to educate patient and his paid caregiver who was present today in some exercises that could be performed at home. Educated patient and caregiver in some simple standing activities that can be performed with ankle weights (utilized 5#) today and patient performed safely with CGA. Added these activities to HEP and issued handout to caregiver today.  Patient will benefit from continued skilled physical therapy to further address his balance deficits in order to decrease his fall risk and promote increased independence with functional mobility and ADLs.     OBJECTIVE IMPAIRMENTS: Abnormal gait, decreased activity tolerance, decreased balance, decreased cognition, decreased endurance, decreased knowledge of condition, decreased knowledge of use of DME, decreased mobility, difficulty walking, decreased strength, decreased safety awareness, hypomobility, impaired flexibility, impaired sensation, improper body mechanics, postural dysfunction, and pain.   ACTIVITY LIMITATIONS: carrying, lifting, bending, standing, squatting, sleeping, stairs, transfers, bed mobility, continence, bathing, toileting, dressing, reach over head, hygiene/grooming, and locomotion level  PARTICIPATION LIMITATIONS: meal prep, cleaning, laundry, medication management, driving, and community activity  PERSONAL FACTORS: Age, Sex, Time since onset of injury/illness/exacerbation, and 3+ comorbidities: Parkinson's, deep brain stim, depression, lipids, HTN, GERD, ED, dementia, seizures/falls, LBP/PSF, hx of lumbar fusion in 04/2022, hx of hip fx 08/2022, arthritis, frequent falls,  and OSA are also affecting patient's functional outcome.   REHAB POTENTIAL: Good  CLINICAL DECISION MAKING: Evolving/moderate complexity  EVALUATION COMPLEXITY: Moderate  PLAN:  PT  FREQUENCY: 1-2x/week  PT DURATION: 12 weeks  PLANNED INTERVENTIONS: 97164- PT Re-evaluation, 97110-Therapeutic exercises, 97530- Therapeutic activity, 97112- Neuromuscular re-education, 97535- Self Care, [id number]- Manual therapy, [phone]- Gait training, [phone]- Orthotic Fit/training, [phone]- Electrical stimulation (manual), Patient/Family education, Balance training, Stair training, Taping, Joint mobilization, Joint manipulation, Spinal mobilization, Vestibular training, Cognitive remediation, DME instructions, Cryotherapy, and Moist heat  PLAN FOR NEXT SESSION:     REVIEW HEP for Compliance  PD management with improved step width/length/high Large amplitude movement training.   Chyrl London, PT  Physical Therapist - [facility]  10:11 PM 12/07/23

## 2023-12-11 ENCOUNTER — Ambulatory Visit: Admitting: Physical Therapy

## 2023-12-11 DIAGNOSIS — M6281 Muscle weakness (generalized): Secondary | ICD-10-CM

## 2023-12-11 DIAGNOSIS — R296 Repeated falls: Secondary | ICD-10-CM

## 2023-12-11 DIAGNOSIS — R278 Other lack of coordination: Secondary | ICD-10-CM

## 2023-12-11 DIAGNOSIS — R2689 Other abnormalities of gait and mobility: Secondary | ICD-10-CM | POA: Diagnosis not present

## 2023-12-11 DIAGNOSIS — G20A1 Parkinson's disease without dyskinesia, without mention of fluctuations: Secondary | ICD-10-CM

## 2023-12-11 DIAGNOSIS — R2681 Unsteadiness on feet: Secondary | ICD-10-CM

## 2023-12-11 DIAGNOSIS — R269 Unspecified abnormalities of gait and mobility: Secondary | ICD-10-CM | POA: Diagnosis not present

## 2023-12-11 DIAGNOSIS — R262 Difficulty in walking, not elsewhere classified: Secondary | ICD-10-CM | POA: Diagnosis not present

## 2023-12-11 NOTE — Therapy (Signed)
 OUTPATIENT PHYSICAL THERAPY TREATMENT   Patient Name: [name] MRN: [id number] DOB:1950-04-20, 73 y.o., male Today's Date: 12/11/2023   PCP: Sid Sieving, PA-C REFERRING PROVIDER: Jacumin, Erika Leigh, PA    END OF SESSION:     PT End of Session - 12/11/23 1105     Visit Number 4    Number of Visits 24    Date for PT Re-Evaluation 02/22/24    PT Start Time 1105    PT Stop Time 1145    PT Time Calculation (min) 40 min    Equipment Utilized During Treatment Gait belt    Activity Tolerance Patient tolerated treatment well;No increased pain    Behavior During Therapy WFL for tasks assessed/performed               Past Medical History:  Diagnosis Date   Anxiety disorder    Arthritis    Bradycardia    Cancer (HCC) 2013   skin cancer   Depression    ptsd   Dysrhythmia    chronic slow heart rate   GERD (gastroesophageal reflux disease)    H/O tachycardia-bradycardia syndrome    Headache(784.0)    tension headaches non recent   History of chicken pox    History of kidney stones    passed   Hypertension    treated with HCTZ   Major depressive disorder    Mild aortic regurgitation    Pacemaker    Parkinson's disease (HCC)    dx'ed 15 years ago   PTSD (post-traumatic stress disorder)    S/P deep brain stimulator placement    Shortness of breath dyspnea    Sleep apnea    doesn't use C-pap   Unspecified dementia, moderate, with psychotic disturbance (HCC)    Varicose veins    Past Surgical History:  Procedure Laterality Date   CATARACT EXTRACTION W/PHACO Right 01/17/2023   Procedure: CATARACT EXTRACTION PHACO AND INTRAOCULAR LENS PLACEMENT (IOC) RIGHT 6.33 00:42.6;  Surgeon: Jaye Fallow, MD;  Location: [facility];  Service: Ophthalmology;  Laterality: Right;   CATARACT EXTRACTION W/PHACO Left 02/07/2023   Procedure: CATARACT EXTRACTION PHACO AND INTRAOCULAR LENS PLACEMENT (IOC) LEFT 6.35 00:38.3;  Surgeon: Jaye Fallow, MD;   Location: [facility];  Service: Ophthalmology;  Laterality: Left;   CHOLECYSTECTOMY N/A 10/22/2014   Procedure: LAPAROSCOPIC CHOLECYSTECTOMY WITH INTRAOPERATIVE CHOLANGIOGRAM;  Surgeon: Elgin Laurence III, MD;  Location: [facility];  Service: General;  Laterality: N/A;   cyst removed      from lip as a child   INTRAMEDULLARY (IM) NAIL INTERTROCHANTERIC N/A 02/18/2019   Procedure: INTRAMEDULLARY (IM) NAIL INTERTROCHANTRIC, RIGHT,;  Surgeon: Marchia Drivers, MD;  Location: [facility];  Service: Orthopedics;  Laterality: N/A;   LUMBAR LAMINECTOMY/DECOMPRESSION MICRODISCECTOMY Bilateral 12/14/2012   Procedure: Bilateral lumbar three-four, four-five decompressive laminotomy/foraminotomy;  Surgeon: Victory DELENA Gunnels, MD;  Location: [facility];  Service: Neurosurgery;  Laterality: Bilateral;   PULSE GENERATOR IMPLANT Bilateral 12/13/2013   Procedure: Bilateral implantable pulse generator placement;  Surgeon: Fairy Levels, MD;  Location: [facility];  Service: Neurosurgery;  Laterality: Bilateral;  Bilateral implantable pulse generator placement   skin cancer removed     from ears,   12 lft arm  rt leg 15   SUBTHALAMIC STIMULATOR BATTERY REPLACEMENT Bilateral 07/14/2017   Procedure: BILATERAL IMPLANTED PULSE GENERATOR CHANGE FOR DEEP BRAIN STIMULATOR;  Surgeon: Levels Fairy, MD;  Location: [facility];  Service: Neurosurgery;  Laterality: Bilateral;   SUBTHALAMIC STIMULATOR INSERTION Bilateral 12/06/2013   Procedure:  SUBTHALAMIC STIMULATOR INSERTION;  Surgeon: Fairy Levels, MD;  Location: [facility];  Service: Neurosurgery;  Laterality: Bilateral;  Bilateral deep brain stimulator placement   Patient Active Problem List   Diagnosis Date Noted   Hematuria 10/10/2021   Sinus drainage 12/09/2020   Medicare annual wellness visit, subsequent 11/18/2020   Pacemaker    Dementia associated with Parkinson's disease (HCC) 06/17/2020   Vertigo 02/19/2020   Urine abnormality 02/19/2020   Dysphagia 02/23/2019   Fall at  home 10/18/2017   REM behavioral disorder 11/02/2016   Radicular pain in right arm 10/09/2016   GERD (gastroesophageal reflux disease) 07/31/2016   Colon cancer screening 07/23/2015   Gout 02/18/2015   Depression 01/06/2015   Skin lesion 10/02/2014   Dupuytren's contracture 08/20/2014   Cough 07/21/2014   Lumbar stenosis with neurogenic claudication 06/13/2014   S/P deep brain stimulator placement 05/08/2014   Advance care planning 01/19/2014   Parkinson's disease (HCC) 12/13/2013   PTSD (post-traumatic stress disorder) 06/13/2013   Erectile dysfunction 06/13/2013   HLD (hyperlipidemia) 06/13/2013   Essential hypertension 06/03/2013   Bradycardia by electrocardiogram 06/03/2013   Obstructive sleep apnea 03/12/2013    ONSET DATE: ~15 years ago when pt was diagnosed with Parkinson's  REFERRING DIAG: G20.A1 (ICD-10-CM) - Parkinsons disease (HCC)   THERAPY DIAG:  Unsteadiness on feet  Abnormality of gait and mobility  Difficulty in walking, not elsewhere classified  Muscle weakness (generalized)  Other lack of coordination  Repeated falls  Other abnormalities of gait and mobility  Parkinson's disease without dyskinesia or fluctuating manifestations (HCC)  Rationale for Evaluation and Treatment: Rehabilitation  SUBJECTIVE:                                                                                                                                                                                             SUBJECTIVE STATEMENT:  [name] reports that he had 4 falls since last P Treatment, pt reports that he doesn't believe that stating that he only fell 3 times.  Clemens one time while out of town in [place] then 3 times at home.   From eval, states that he has been falling more frequently. At least 6-8 times in the last 2 weeks. With 3 falls reported by by wife last Monday. States that the He fell hard 2 weeks ago and was seen at [facility]. All images were  negative. No fx. No internal or cerebral damage noted per wife's report.  State that it has been a While since he has seen PD specialist.   PERTINENT HISTORY: Parkinson's s/p deep brain stim, depression, HTN, GERD, clavicular fracture  2025, dementia, seizures/falls, LBP/PSF, hx of lumbar fusion in 04/2022, hx of hip fx 08/2022, arthritis, frequent falls, and OSA   PAIN:  Are you having pain? No  PRECAUTIONS: Fall and Other:   frequent falls, deep brain stimulator   WEIGHT BEARING RESTRICTIONS: Unknown   FALLS: Has patient fallen in last 6 months? Yes. Number of falls 10+   LIVING ENVIRONMENT: Lives with: lives with their spouse (wife, [name]) and has hired caregivers Lives in: House/apartment Stairs: Yes: External: 2-3 steps; bilateral but cannot reach both (due to current home renovations, this is the entrance he uses) primarily uses R HR Has following equipment at home: Wheelchair (manual), shower chair, Grab bars, and Up-Walker  PLOF: Independent with household mobility with device, Requires assistive device for independence, Needs assistance with ADLs, and meal prep  Pt reports he frequently leaves his AD to the side when going to get something, often forgetting it - he requires reinforced education on consistent use of U-step walker  PATIENT GOALS: Improve Balance  OBJECTIVE:  Note: Objective measures were completed at Evaluation unless otherwise noted.  DIAGNOSTIC FINDINGS:   CT CERVICAL SPINE WITHOUT CONTRAST on 05/22/2023 IMPRESSION: Disc levels: Disc space narrowing and spurring in the lower cervical spine. Mild bilateral degenerative facet disease, left greater than right. Degenerative disc and facet disease throughout the cervical spine. No acute bony abnormality.  CT HEAD WITHOUT CONTRAST on 05/22/2023 FINDINGS: Brain: Deep brain stimulators are noted with the tips in the region of the thalami bilaterally, unchanged. Mild atrophy. No acute intracranial  abnormality. Specifically, no hemorrhage, hydrocephalus, mass lesion, acute infarction, or significant intracranial injury. Vascular: No hyperdense vessel or unexpected calcification. Skull: No acute calvarial abnormality. Sinuses/Orbits: No acute findings Other: None IMPRESSION: No acute intracranial abnormality.  LEFT SHOULDER - 2+ VIEW on 05/22/2023 IMPRESSION: Acute mildly displaced fracture of the lateral aspect of the left clavicle with intra-articular extension.  COGNITION: Overall cognitive status: History of cognitive impairments - at baseline and pt with very delayed verbal responses to subjective questioning and noted to be easily distracted in the therapy gym environment  LOWER EXTREMITY MMT:   8/14 MMT Right Eval Left Eval  Hip flexion 4 4  Hip extension 4 4  Hip abduction    Hip adduction    Hip internal rotation    Hip external rotation    Knee flexion 4+ 4+  Knee extension 4+ 4+  Ankle dorsiflexion 4+ 4+  Ankle plantarflexion    Ankle inversion    Ankle eversion    (Blank rows = not tested)  BED MOBILITY:  Sit to supine Min A and Mod A Supine to sit Min A and Mod A Requires adjustable bed with bedrail at home   TRANSFERS: Assistive device utilized: Up-Walker  Sit to stand: SBA, CGA, and Min A Stand to sit: SBA, CGA, and Min A Chair to chair: CGA and Min A Floor: Not assessed  STAIRS: Not assessed on 8/15.    From prior PT treatment:  Level of Assistance: 06/12/23: Min A Stair Negotiation Technique: Forwards with Bilateral Rails  Number of Stairs: 4  Height of Stairs: 6inch  Comments: 06/12/23 - posterior LOB at top of steps when turning around, minor posterior lean throughout  GAIT: Gait pattern: step through pattern, decreased step length- Right, decreased step length- Left, decreased stride length, knee flexed in stance- Right, knee flexed in stance- Left, shuffling, festinating, trunk flexed, wide BOS, poor foot clearance- Right, and poor  foot clearance- Left Distance walked: ~166ft  Assistive device utilized: Up-Walker Level of assistance: CGA and Min A Comments: Noticeable festination when turning, stepping back to sit, or side stepping  FUNCTIONAL TESTS:   5 times sit to stand: 23.5s Timed up and go (TUG): 32.8 sec with 4WW   10 meter walk test: 0.45m/s with 4WW  Berg Balance Scale: no assessed on 8/14  PATIENT SURVEYS:  ABC scale 06/12/23: 30%                                                                                                                             TREATMENT DATE:  12/11/23    Arrives to therapy in transport chair.  TA and GAIT TRAINING:   Nustep: level 1-3 X 6 min BUE/BLE cues for full ROM and improved reciprocal pattern and increased SPM at full range.   Gait with 4WW upright walking x 613ft. Min cues for improved step length and heel contact. Was noted to have slightly improved step width on this day, not stepping on contralateral LE with limb advancement, even with fatigue   Forward/reverse gait with instruction for improved step width and length to reduce fall risk 51ft x 6   Ptt requesting to utilize head (restroom). Toileting with distant supervision assist cues for proper placement of 4WW for safety in bathroom, as well as safety to decrease speed of lowering to toilet.    Sit<>stand x 5 without UE support. Performed x 3 bouts. CGA initially for anterior weight shift, but improved with repetitions between bouts.   Pt left in transport chair with SO at end of session.   PATIENT EDUCATION: Education details: PT POC, LTGs, findings on balance assessments today Person educated: Patient and Caregiver Angelica Education method: Explanation Education comprehension: verbalized understanding and needs further education  HOME EXERCISE PROGRAM:   Access Code: [email or web address] URL: [email or web address] Date: 12/07/2023 Prepared by: Reyes London  Exercises - Sit to Stand  with Arms Crossed  - 3 x weekly - 3 sets - 10 reps - Standing March with Counter Support  - 3 x weekly - 3 sets - 10 reps - Standing Hip Abduction with Counter Support  - 3 x weekly - 3 sets - 10 reps - Standing Knee Flexion with Counter Support  - 3 x weekly - 3 sets - 10 reps - Standing Hip Extension with Counter Support  - 3 x weekly - 3 sets - 10 reps - Heel Toe Raises with Unilateral Counter Support  - 3 x weekly - 3 sets - 10 reps - Forward Step Up with Counter Support  - 3 x weekly - 3 sets - 10 reps     (From prior PT episode) Access Code: [email or web address] URL: [email or web address] Date: 06/28/2023 Prepared by: Connell Kiss  Exercises - Walking  - 1 x daily - 7 x weekly - 2 sets - 3-5 minutes hold - Sit to Stand  - 1 x daily - 7 x weekly - 2 sets - 12-15 reps -  Side Stepping with Resistance at Thighs and Counter Support  - 1 x daily - 7 x weekly - 3 sets - 10 reps - Supine Bridge with Resistance Band  - 1 x daily - 7 x weekly - 2 sets - 10-15 reps - Clamshell  - 1 x daily - 7 x weekly - 3 sets - 10 reps  GOALS: Goals reviewed with patient? Yes  SHORT TERM GOALS: Target date: 12/28/2023    Patient will be independent in home exercise program to improve strength/mobility for better functional independence with ADLs. Baseline: non compliant Goal status: INITIAL   2. Patient will report <2 falls over past 6 weeks to demonstrate improved safety awareness at home.  Baseline: > 3 per week  Goal Status: INITIAL   LONG TERM GOALS: Target date: 11/16/2023  1.  Patient (> 52 years old) will complete five times sit to stand test in < 15 seconds indicating an increased LE strength and improved balance. Baseline: 23.5s Goal status: INITIAL  2.  Patient will increase Berg Balance score by > 6 points to demonstrate decreased fall risk during functional activities. Baseline: 23/56 Goal status: INITIAL    3. Patient will reduce timed up and go to <11 seconds to reduce fall  risk and demonstrate improved transfer/gait ability. Baseline:33 sec with 4WW Goal status: INITIAL   4.Patient will increase 10 meter walk test to >1.74m/s as to improve gait speed for better community ambulation and to reduce fall risk. Baseline: 0.55m/s with 4WW Goal status: INITIAL   5.  Patient will require no more than supervision assist with ascending/descending 3 steps using handrails per home set-up.  Baseline: to be assessed   Goal status: INITIAL    ASSESSMENT: CLINICAL IMPRESSION:     Patient put forth excellent throughout session. Is able to demonstrate improved use of 4WW throughout session with improved posture, step length and step width, without stepping on contralateral BLE with prolonged gait on this day; pt was also noted to have improved safety and controlled sitting speed to arm chair and toilet  on this day. Patient will benefit from continued skilled physical therapy to further address his balance deficits in order to decrease his fall risk and promote increased independence with functional mobility and ADLs.     OBJECTIVE IMPAIRMENTS: Abnormal gait, decreased activity tolerance, decreased balance, decreased cognition, decreased endurance, decreased knowledge of condition, decreased knowledge of use of DME, decreased mobility, difficulty walking, decreased strength, decreased safety awareness, hypomobility, impaired flexibility, impaired sensation, improper body mechanics, postural dysfunction, and pain.   ACTIVITY LIMITATIONS: carrying, lifting, bending, standing, squatting, sleeping, stairs, transfers, bed mobility, continence, bathing, toileting, dressing, reach over head, hygiene/grooming, and locomotion level  PARTICIPATION LIMITATIONS: meal prep, cleaning, laundry, medication management, driving, and community activity  PERSONAL FACTORS: Age, Sex, Time since onset of injury/illness/exacerbation, and 3+ comorbidities: Parkinson's, deep brain stim, depression,  lipids, HTN, GERD, ED, dementia, seizures/falls, LBP/PSF, hx of lumbar fusion in 04/2022, hx of hip fx 08/2022, arthritis, frequent falls,  and OSA are also affecting patient's functional outcome.   REHAB POTENTIAL: Good  CLINICAL DECISION MAKING: Evolving/moderate complexity  EVALUATION COMPLEXITY: Moderate  PLAN:  PT FREQUENCY: 1-2x/week  PT DURATION: 12 weeks  PLANNED INTERVENTIONS: 97164- PT Re-evaluation, 97110-Therapeutic exercises, 97530- Therapeutic activity, 97112- Neuromuscular re-education, 97535- Self Care, [id number]- Manual therapy, [phone]- Gait training, [phone]- Orthotic Fit/training, [phone]- Electrical stimulation (manual), Patient/Family education, Balance training, Stair training, Taping, Joint mobilization, Joint manipulation, Spinal mobilization, Vestibular training, Cognitive remediation, DME instructions, Cryotherapy, and  Moist heat  PLAN FOR NEXT SESSION:     Continue to REVIEW HEP for Compliance  PD management with improved step width/length/high Large amplitude movement training.      Massie Dollar PT, DPT  Physical Therapist - [place]  [facility]  2:37 PM 12/11/23

## 2023-12-14 ENCOUNTER — Ambulatory Visit

## 2023-12-14 DIAGNOSIS — R278 Other lack of coordination: Secondary | ICD-10-CM | POA: Diagnosis not present

## 2023-12-14 DIAGNOSIS — G20A1 Parkinson's disease without dyskinesia, without mention of fluctuations: Secondary | ICD-10-CM

## 2023-12-14 DIAGNOSIS — M6281 Muscle weakness (generalized): Secondary | ICD-10-CM

## 2023-12-14 DIAGNOSIS — R2689 Other abnormalities of gait and mobility: Secondary | ICD-10-CM | POA: Diagnosis not present

## 2023-12-14 DIAGNOSIS — R269 Unspecified abnormalities of gait and mobility: Secondary | ICD-10-CM | POA: Diagnosis not present

## 2023-12-14 DIAGNOSIS — R296 Repeated falls: Secondary | ICD-10-CM

## 2023-12-14 DIAGNOSIS — R262 Difficulty in walking, not elsewhere classified: Secondary | ICD-10-CM | POA: Diagnosis not present

## 2023-12-14 DIAGNOSIS — R2681 Unsteadiness on feet: Secondary | ICD-10-CM

## 2023-12-14 NOTE — Therapy (Signed)
 OUTPATIENT PHYSICAL THERAPY TREATMENT   Patient Name: [name] MRN: [id number] DOB:12-08-1950, 73 y.o., male Today's Date: 12/14/2023   PCP: Sid Sieving, PA-C REFERRING PROVIDER: Jacumin, Erika Leigh, PA    END OF SESSION:     PT End of Session - 12/14/23 1025     Visit Number 5    Number of Visits 24    Date for PT Re-Evaluation 02/22/24    PT Start Time 1015    PT Stop Time 1059    PT Time Calculation (min) 44 min    Equipment Utilized During Treatment Gait belt    Activity Tolerance Patient tolerated treatment well;No increased pain    Behavior During Therapy WFL for tasks assessed/performed                Past Medical History:  Diagnosis Date   Anxiety disorder    Arthritis    Bradycardia    Cancer (HCC) 2013   skin cancer   Depression    ptsd   Dysrhythmia    chronic slow heart rate   GERD (gastroesophageal reflux disease)    H/O tachycardia-bradycardia syndrome    Headache(784.0)    tension headaches non recent   History of chicken pox    History of kidney stones    passed   Hypertension    treated with HCTZ   Major depressive disorder    Mild aortic regurgitation    Pacemaker    Parkinson's disease (HCC)    dx'ed 15 years ago   PTSD (post-traumatic stress disorder)    S/P deep brain stimulator placement    Shortness of breath dyspnea    Sleep apnea    doesn't use C-pap   Unspecified dementia, moderate, with psychotic disturbance (HCC)    Varicose veins    Past Surgical History:  Procedure Laterality Date   CATARACT EXTRACTION W/PHACO Right 01/17/2023   Procedure: CATARACT EXTRACTION PHACO AND INTRAOCULAR LENS PLACEMENT (IOC) RIGHT 6.33 00:42.6;  Surgeon: Jaye Fallow, MD;  Location: [facility];  Service: Ophthalmology;  Laterality: Right;   CATARACT EXTRACTION W/PHACO Left 02/07/2023   Procedure: CATARACT EXTRACTION PHACO AND INTRAOCULAR LENS PLACEMENT (IOC) LEFT 6.35 00:38.3;  Surgeon: Jaye Fallow, MD;   Location: [facility];  Service: Ophthalmology;  Laterality: Left;   CHOLECYSTECTOMY N/A 10/22/2014   Procedure: LAPAROSCOPIC CHOLECYSTECTOMY WITH INTRAOPERATIVE CHOLANGIOGRAM;  Surgeon: Elgin Laurence III, MD;  Location: [facility];  Service: General;  Laterality: N/A;   cyst removed      from lip as a child   INTRAMEDULLARY (IM) NAIL INTERTROCHANTERIC N/A 02/18/2019   Procedure: INTRAMEDULLARY (IM) NAIL INTERTROCHANTRIC, RIGHT,;  Surgeon: Marchia Drivers, MD;  Location: [facility];  Service: Orthopedics;  Laterality: N/A;   LUMBAR LAMINECTOMY/DECOMPRESSION MICRODISCECTOMY Bilateral 12/14/2012   Procedure: Bilateral lumbar three-four, four-five decompressive laminotomy/foraminotomy;  Surgeon: Victory DELENA Gunnels, MD;  Location: [facility];  Service: Neurosurgery;  Laterality: Bilateral;   PULSE GENERATOR IMPLANT Bilateral 12/13/2013   Procedure: Bilateral implantable pulse generator placement;  Surgeon: Fairy Levels, MD;  Location: [facility];  Service: Neurosurgery;  Laterality: Bilateral;  Bilateral implantable pulse generator placement   skin cancer removed     from ears,   12 lft arm  rt leg 15   SUBTHALAMIC STIMULATOR BATTERY REPLACEMENT Bilateral 07/14/2017   Procedure: BILATERAL IMPLANTED PULSE GENERATOR CHANGE FOR DEEP BRAIN STIMULATOR;  Surgeon: Levels Fairy, MD;  Location: [facility];  Service: Neurosurgery;  Laterality: Bilateral;   SUBTHALAMIC STIMULATOR INSERTION Bilateral 12/06/2013  Procedure: SUBTHALAMIC STIMULATOR INSERTION;  Surgeon: Fairy Levels, MD;  Location: [facility];  Service: Neurosurgery;  Laterality: Bilateral;  Bilateral deep brain stimulator placement   Patient Active Problem List   Diagnosis Date Noted   Hematuria 10/10/2021   Sinus drainage 12/09/2020   Medicare annual wellness visit, subsequent 11/18/2020   Pacemaker    Dementia associated with Parkinson's disease (HCC) 06/17/2020   Vertigo 02/19/2020   Urine abnormality 02/19/2020   Dysphagia 02/23/2019   Fall at  home 10/18/2017   REM behavioral disorder 11/02/2016   Radicular pain in right arm 10/09/2016   GERD (gastroesophageal reflux disease) 07/31/2016   Colon cancer screening 07/23/2015   Gout 02/18/2015   Depression 01/06/2015   Skin lesion 10/02/2014   Dupuytren's contracture 08/20/2014   Cough 07/21/2014   Lumbar stenosis with neurogenic claudication 06/13/2014   S/P deep brain stimulator placement 05/08/2014   Advance care planning 01/19/2014   Parkinson's disease (HCC) 12/13/2013   PTSD (post-traumatic stress disorder) 06/13/2013   Erectile dysfunction 06/13/2013   HLD (hyperlipidemia) 06/13/2013   Essential hypertension 06/03/2013   Bradycardia by electrocardiogram 06/03/2013   Obstructive sleep apnea 03/12/2013    ONSET DATE: ~15 years ago when pt was diagnosed with Parkinson's  REFERRING DIAG: G20.A1 (ICD-10-CM) - Parkinsons disease (HCC)   THERAPY DIAG:  Unsteadiness on feet  Abnormality of gait and mobility  Difficulty in walking, not elsewhere classified  Muscle weakness (generalized)  Other lack of coordination  Repeated falls  Other abnormalities of gait and mobility  Parkinson's disease without dyskinesia or fluctuating manifestations (HCC)  Rationale for Evaluation and Treatment: Rehabilitation  SUBJECTIVE:                                                                                                                                                                                             SUBJECTIVE STATEMENT:  [name] reports doing well overall  but states he needs to make sure he is placing both hands on handles of walker.   From eval, states that he has been falling more frequently. At least 6-8 times in the last 2 weeks. With 3 falls reported by by wife last Monday. States that the He fell hard 2 weeks ago and was seen [facility]. All images were negative. No fx. No internal or cerebral damage noted per wife's report.  State that it has been  a While since he has seen PD specialist.   PERTINENT HISTORY: Parkinson's s/p deep brain stim, depression, HTN, GERD, clavicular fracture 2025, dementia, seizures/falls, LBP/PSF, hx of lumbar fusion in 04/2022, hx of hip fx 08/2022, arthritis, frequent falls,  and OSA   PAIN:  Are you having pain? No  PRECAUTIONS: Fall and Other:   frequent falls, deep brain stimulator   WEIGHT BEARING RESTRICTIONS: Unknown   FALLS: Has patient fallen in last 6 months? Yes. Number of falls 10+   LIVING ENVIRONMENT: Lives with: lives with their spouse (wife, [name]) and has hired caregivers Lives in: House/apartment Stairs: Yes: External: 2-3 steps; bilateral but cannot reach both (due to current home renovations, this is the entrance he uses) primarily uses R HR Has following equipment at home: Wheelchair (manual), shower chair, Grab bars, and Up-Walker  PLOF: Independent with household mobility with device, Requires assistive device for independence, Needs assistance with ADLs, and meal prep  Pt reports he frequently leaves his AD to the side when going to get something, often forgetting it - he requires reinforced education on consistent use of U-step walker  PATIENT GOALS: Improve Balance  OBJECTIVE:  Note: Objective measures were completed at Evaluation unless otherwise noted.  DIAGNOSTIC FINDINGS:   CT CERVICAL SPINE WITHOUT CONTRAST on 05/22/2023 IMPRESSION: Disc levels: Disc space narrowing and spurring in the lower cervical spine. Mild bilateral degenerative facet disease, left greater than right. Degenerative disc and facet disease throughout the cervical spine. No acute bony abnormality.  CT HEAD WITHOUT CONTRAST on 05/22/2023 FINDINGS: Brain: Deep brain stimulators are noted with the tips in the region of the thalami bilaterally, unchanged. Mild atrophy. No acute intracranial abnormality. Specifically, no hemorrhage, hydrocephalus, mass lesion, acute infarction, or  significant intracranial injury. Vascular: No hyperdense vessel or unexpected calcification. Skull: No acute calvarial abnormality. Sinuses/Orbits: No acute findings Other: None IMPRESSION: No acute intracranial abnormality.  LEFT SHOULDER - 2+ VIEW on 05/22/2023 IMPRESSION: Acute mildly displaced fracture of the lateral aspect of the left clavicle with intra-articular extension.  COGNITION: Overall cognitive status: History of cognitive impairments - at baseline and pt with very delayed verbal responses to subjective questioning and noted to be easily distracted in the therapy gym environment  LOWER EXTREMITY MMT:   8/14 MMT Right Eval Left Eval  Hip flexion 4 4  Hip extension 4 4  Hip abduction    Hip adduction    Hip internal rotation    Hip external rotation    Knee flexion 4+ 4+  Knee extension 4+ 4+  Ankle dorsiflexion 4+ 4+  Ankle plantarflexion    Ankle inversion    Ankle eversion    (Blank rows = not tested)  BED MOBILITY:  Sit to supine Min A and Mod A Supine to sit Min A and Mod A Requires adjustable bed with bedrail at home   TRANSFERS: Assistive device utilized: Up-Walker  Sit to stand: SBA, CGA, and Min A Stand to sit: SBA, CGA, and Min A Chair to chair: CGA and Min A Floor: Not assessed  STAIRS: Not assessed on 8/15.    From prior PT treatment:  Level of Assistance: 06/12/23: Min A Stair Negotiation Technique: Forwards with Bilateral Rails  Number of Stairs: 4  Height of Stairs: 6inch  Comments: 06/12/23 - posterior LOB at top of steps when turning around, minor posterior lean throughout  GAIT: Gait pattern: step through pattern, decreased step length- Right, decreased step length- Left, decreased stride length, knee flexed in stance- Right, knee flexed in stance- Left, shuffling, festinating, trunk flexed, wide BOS, poor foot clearance- Right, and poor foot clearance- Left Distance walked: ~164ft Assistive device utilized: Up-Walker Level of  assistance: CGA and Min A Comments: Noticeable festination when turning, stepping back  to sit, or side stepping  FUNCTIONAL TESTS:   5 times sit to stand: 23.5s Timed up and go (TUG): 32.8 sec with 4WW   10 meter walk test: 0.69m/s with 4WW  Berg Balance Scale: no assessed on 8/14  PATIENT SURVEYS:  ABC scale 06/12/23: 30%                                                                                                                             TREATMENT DATE:  12/14/23    Arrives to therapy in transport chair.  TA and GAIT TRAINING:     Gait with [id number] with 5#AW upright walking x 554ft. Min cues for improved step length and width. No LOB and maintained hands on handles at all times today.   Forward/reverse gait with instruction for improved step width and length to reduce fall risk 76ft x 6    Sit<>stand x 12 without UE support. Performed x 2 bouts. VC for technique- less cues with practice  initially for anterior weight shift, but improved with repetitions between bouts.   Transfer safety training- walking backward to chair from support bar x 10 and practice correct hand placement with reaching back .   NMR:  Dynamic marching on airex pad (at support bar)- min to no UE support x 20 reps each Dynamic step + forward/diagonal reach (at dry erase board) - reaching for sticky notes labeled 1 and 2 x 15 each Dynamic overhead reach (at dry erase board)- holding green ball and reaching overhead to place ball on top of dry erase board- performing heel raise for added height x 20 reps   Pt left in transport chair with SO at end of session.   PATIENT EDUCATION: Education details: PT POC, LTGs, findings on balance assessments today Person educated: Patient and Caregiver Angelica Education method: Explanation Education comprehension: verbalized understanding and needs further education  HOME EXERCISE PROGRAM:   Access Code: [email or web address] URL: [email or web address] Date:  12/07/2023 Prepared by: Reyes London  Exercises - Sit to Stand with Arms Crossed  - 3 x weekly - 3 sets - 10 reps - Standing March with Counter Support  - 3 x weekly - 3 sets - 10 reps - Standing Hip Abduction with Counter Support  - 3 x weekly - 3 sets - 10 reps - Standing Knee Flexion with Counter Support  - 3 x weekly - 3 sets - 10 reps - Standing Hip Extension with Counter Support  - 3 x weekly - 3 sets - 10 reps - Heel Toe Raises with Unilateral Counter Support  - 3 x weekly - 3 sets - 10 reps - Forward Step Up with Counter Support  - 3 x weekly - 3 sets - 10 reps     (From prior PT episode) Access Code: [email or web address] URL: [email or web address] Date: 06/28/2023 Prepared by: Connell Kiss  Exercises - Walking  - 1 x daily - 7 x weekly - 2 sets - 3-5 minutes hold -  Sit to Stand  - 1 x daily - 7 x weekly - 2 sets - 12-15 reps - Side Stepping with Resistance at Thighs and Counter Support  - 1 x daily - 7 x weekly - 3 sets - 10 reps - Supine Bridge with Resistance Band  - 1 x daily - 7 x weekly - 2 sets - 10-15 reps - Clamshell  - 1 x daily - 7 x weekly - 3 sets - 10 reps  GOALS: Goals reviewed with patient? Yes  SHORT TERM GOALS: Target date: 12/28/2023    Patient will be independent in home exercise program to improve strength/mobility for better functional independence with ADLs. Baseline: non compliant Goal status: INITIAL   2. Patient will report <2 falls over past 6 weeks to demonstrate improved safety awareness at home.  Baseline: > 3 per week  Goal Status: INITIAL   LONG TERM GOALS: Target date: 11/16/2023  1.  Patient (> 78 years old) will complete five times sit to stand test in < 15 seconds indicating an increased LE strength and improved balance. Baseline: 23.5s Goal status: INITIAL  2.  Patient will increase Berg Balance score by > 6 points to demonstrate decreased fall risk during functional activities. Baseline: 23/56 Goal status: INITIAL     3. Patient will reduce timed up and go to <11 seconds to reduce fall risk and demonstrate improved transfer/gait ability. Baseline:33 sec with 4WW Goal status: INITIAL   4.Patient will increase 10 meter walk test to >1.43m/s as to improve gait speed for better community ambulation and to reduce fall risk. Baseline: 0.90m/s with 4WW Goal status: INITIAL   5.  Patient will require no more than supervision assist with ascending/descending 3 steps using handrails per home set-up.  Baseline: to be assessed   Goal status: INITIAL    ASSESSMENT: CLINICAL IMPRESSION:    Treatment focused on large amplitude movement activities and functional strengthening using 5# AW for resistive gait and patient performed well- no significant fatigue. He does some continued issues at times with motor planning but performs better with practice and verbal/visual cues. Continued safety training to ensure he is using equipment safely and transferring safely. Patient will benefit from continued skilled physical therapy to further address his balance deficits in order to decrease his fall risk and promote increased independence with functional mobility and ADLs.     OBJECTIVE IMPAIRMENTS: Abnormal gait, decreased activity tolerance, decreased balance, decreased cognition, decreased endurance, decreased knowledge of condition, decreased knowledge of use of DME, decreased mobility, difficulty walking, decreased strength, decreased safety awareness, hypomobility, impaired flexibility, impaired sensation, improper body mechanics, postural dysfunction, and pain.   ACTIVITY LIMITATIONS: carrying, lifting, bending, standing, squatting, sleeping, stairs, transfers, bed mobility, continence, bathing, toileting, dressing, reach over head, hygiene/grooming, and locomotion level  PARTICIPATION LIMITATIONS: meal prep, cleaning, laundry, medication management, driving, and community activity  PERSONAL FACTORS: Age, Sex, Time since  onset of injury/illness/exacerbation, and 3+ comorbidities: Parkinson's, deep brain stim, depression, lipids, HTN, GERD, ED, dementia, seizures/falls, LBP/PSF, hx of lumbar fusion in 04/2022, hx of hip fx 08/2022, arthritis, frequent falls,  and OSA are also affecting patient's functional outcome.   REHAB POTENTIAL: Good  CLINICAL DECISION MAKING: Evolving/moderate complexity  EVALUATION COMPLEXITY: Moderate  PLAN:  PT FREQUENCY: 1-2x/week  PT DURATION: 12 weeks  PLANNED INTERVENTIONS: 97164- PT Re-evaluation, 97110-Therapeutic exercises, 97530- Therapeutic activity, 97112- Neuromuscular re-education, 97535- Self Care, [id number]- Manual therapy, [id number]- Gait training, [phone]- Orthotic Fit/training, [phone]- Electrical stimulation (manual), Patient/Family education, Balance training, Stair  training, Taping, Joint mobilization, Joint manipulation, Spinal mobilization, Vestibular training, Cognitive remediation, DME instructions, Cryotherapy, and Moist heat  PLAN FOR NEXT SESSION:     Continue to REVIEW HEP for Compliance  PD management with improved step width/length/high Large amplitude movement training.      Chyrl London, PT Physical Therapist - [facility]  6:04 PM 12/14/23

## 2023-12-21 ENCOUNTER — Ambulatory Visit

## 2023-12-25 ENCOUNTER — Ambulatory Visit

## 2023-12-28 ENCOUNTER — Ambulatory Visit

## 2024-01-01 ENCOUNTER — Ambulatory Visit: Attending: Physician Assistant

## 2024-01-01 NOTE — Therapy (Incomplete)
 OUTPATIENT PHYSICAL THERAPY TREATMENT   Patient Name: [name] MRN: [id number] DOB:August 10, 1950, 73 y.o., male Today's Date: 01/01/2024   PCP: George Sieving, PA-C REFERRING PROVIDER: Jacumin, Erika Leigh, PA    END OF SESSION:             Past Medical History:  Diagnosis Date   Anxiety disorder    Arthritis    Bradycardia    Cancer (HCC) 2013   skin cancer   Depression    ptsd   Dysrhythmia    chronic slow heart rate   GERD (gastroesophageal reflux disease)    H/O tachycardia-bradycardia syndrome    Headache(784.0)    tension headaches non recent   History of chicken pox    History of kidney stones    passed   Hypertension    treated with HCTZ   Major depressive disorder    Mild aortic regurgitation    Pacemaker    Parkinson's disease (HCC)    dx'ed 15 years ago   PTSD (post-traumatic stress disorder)    S/P deep brain stimulator placement    Shortness of breath dyspnea    Sleep apnea    doesn't use C-pap   Unspecified dementia, moderate, with psychotic disturbance (HCC)    Varicose veins    Past Surgical History:  Procedure Laterality Date   CATARACT EXTRACTION W/PHACO Right 01/17/2023   Procedure: CATARACT EXTRACTION PHACO AND INTRAOCULAR LENS PLACEMENT (IOC) RIGHT 6.33 00:42.6;  Surgeon: George Fallow, MD;  Location: [facility];  Service: Ophthalmology;  Laterality: Right;   CATARACT EXTRACTION W/PHACO Left 02/07/2023   Procedure: CATARACT EXTRACTION PHACO AND INTRAOCULAR LENS PLACEMENT (IOC) LEFT 6.35 00:38.3;  Surgeon: George Fallow, MD;  Location: [facility];  Service: Ophthalmology;  Laterality: Left;   CHOLECYSTECTOMY N/A 10/22/2014   Procedure: LAPAROSCOPIC CHOLECYSTECTOMY WITH INTRAOPERATIVE CHOLANGIOGRAM;  Surgeon: George Laurence III, MD;  Location: [facility];  Service: General;  Laterality: N/A;   cyst removed      from lip as a child   INTRAMEDULLARY (IM) NAIL INTERTROCHANTERIC N/A 02/18/2019   Procedure:  INTRAMEDULLARY (IM) NAIL INTERTROCHANTRIC, RIGHT,;  Surgeon: George Drivers, MD;  Location: [facility];  Service: Orthopedics;  Laterality: N/A;   LUMBAR LAMINECTOMY/DECOMPRESSION MICRODISCECTOMY Bilateral 12/14/2012   Procedure: Bilateral lumbar three-four, four-five decompressive laminotomy/foraminotomy;  Surgeon: George DELENA Gunnels, MD;  Location: [facility];  Service: Neurosurgery;  Laterality: Bilateral;   PULSE GENERATOR IMPLANT Bilateral 12/13/2013   Procedure: Bilateral implantable pulse generator placement;  Surgeon: George Levels, MD;  Location: [facility];  Service: Neurosurgery;  Laterality: Bilateral;  Bilateral implantable pulse generator placement   skin cancer removed     from ears,   12 lft arm  rt leg 15   SUBTHALAMIC STIMULATOR BATTERY REPLACEMENT Bilateral 07/14/2017   Procedure: BILATERAL IMPLANTED PULSE GENERATOR CHANGE FOR DEEP BRAIN STIMULATOR;  Surgeon: Mcgee Fairy, MD;  Location: [facility];  Service: Neurosurgery;  Laterality: Bilateral;   SUBTHALAMIC STIMULATOR INSERTION Bilateral 12/06/2013   Procedure: SUBTHALAMIC STIMULATOR INSERTION;  Surgeon: George Levels, MD;  Location: [facility];  Service: Neurosurgery;  Laterality: Bilateral;  Bilateral deep brain stimulator placement   Patient Active Problem List   Diagnosis Date Noted   Hematuria 10/10/2021   Sinus drainage 12/09/2020   Medicare annual wellness visit, subsequent 11/18/2020   Pacemaker    Dementia associated with Parkinson's disease (HCC) 06/17/2020   Vertigo 02/19/2020   Urine abnormality 02/19/2020   Dysphagia 02/23/2019   Fall at home 10/18/2017   REM  behavioral disorder 11/02/2016   Radicular pain in right arm 10/09/2016   GERD (gastroesophageal reflux disease) 07/31/2016   Colon cancer screening 07/23/2015   Gout 02/18/2015   Depression 01/06/2015   Skin lesion 10/02/2014   Dupuytren's contracture 08/20/2014   Cough 07/21/2014   Lumbar stenosis with neurogenic claudication 06/13/2014   S/P deep  brain stimulator placement 05/08/2014   Advance care planning 01/19/2014   Parkinson's disease (HCC) 12/13/2013   PTSD (post-traumatic stress disorder) 06/13/2013   Erectile dysfunction 06/13/2013   HLD (hyperlipidemia) 06/13/2013   Essential hypertension 06/03/2013   Bradycardia by electrocardiogram 06/03/2013   Obstructive sleep apnea 03/12/2013    ONSET DATE: ~15 years ago when pt was diagnosed with Parkinson's  REFERRING DIAG: G20.A1 (ICD-10-CM) - Parkinsons disease (HCC)   THERAPY DIAG:  No diagnosis found.  Rationale for Evaluation and Treatment: Rehabilitation  SUBJECTIVE:                                                                                                                                                                                             SUBJECTIVE STATEMENT:  *** [name] reports doing well overall  but states he needs to make sure he is placing both hands on handles of walker.   From eval, states that he has been falling more frequently. At least 6-8 times in the last 2 weeks. With 3 falls reported by by wife last Monday. States that the He fell hard 2 weeks ago and was seen at [facility]. All images were negative. No fx. No internal or cerebral damage noted per wife's report.  State that it has been a While since he has seen PD specialist.   PERTINENT HISTORY: Parkinson's s/p deep brain stim, depression, HTN, GERD, clavicular fracture 2025, dementia, seizures/falls, LBP/PSF, hx of lumbar fusion in 04/2022, hx of hip fx 08/2022, arthritis, frequent falls, and OSA   PAIN:  Are you having pain? No  PRECAUTIONS: Fall and Other:   frequent falls, deep brain stimulator   WEIGHT BEARING RESTRICTIONS: Unknown   FALLS: Has patient fallen in last 6 months? Yes. Number of falls 10+   LIVING ENVIRONMENT: Lives with: lives with their spouse (wife, [name]) and has hired caregivers Lives in: House/apartment Stairs: Yes: External: 2-3 steps; bilateral  but cannot reach both (due to current home renovations, this is the entrance he uses) primarily uses R HR Has following equipment at home: Wheelchair (manual), shower chair, Grab bars, and Up-Walker  PLOF: Independent with household mobility with device, Requires assistive device for independence, Needs assistance with ADLs, and meal prep  Pt reports he frequently leaves his AD  to the side when going to get something, often forgetting it - he requires reinforced education on consistent use of U-step walker  PATIENT GOALS: Improve Balance  OBJECTIVE:  Note: Objective measures were completed at Evaluation unless otherwise noted.  DIAGNOSTIC FINDINGS:   CT CERVICAL SPINE WITHOUT CONTRAST on 05/22/2023 IMPRESSION: Disc Mcgee: Disc space narrowing and spurring in the lower cervical spine. Mild bilateral degenerative facet disease, left greater than right. Degenerative disc and facet disease throughout the cervical spine. No acute bony abnormality.  CT HEAD WITHOUT CONTRAST on 05/22/2023 FINDINGS: Brain: Deep brain stimulators are noted with the tips in the region of the thalami bilaterally, unchanged. Mild atrophy. No acute intracranial abnormality. Specifically, no hemorrhage, hydrocephalus, mass lesion, acute infarction, or significant intracranial injury. Vascular: No hyperdense vessel or unexpected calcification. Skull: No acute calvarial abnormality. Sinuses/Orbits: No acute findings Other: None IMPRESSION: No acute intracranial abnormality.  LEFT SHOULDER - 2+ VIEW on 05/22/2023 IMPRESSION: Acute mildly displaced fracture of the lateral aspect of the left clavicle with intra-articular extension.  COGNITION: Overall cognitive status: History of cognitive impairments - at baseline and pt with very delayed verbal responses to subjective questioning and noted to be easily distracted in the therapy gym environment  LOWER EXTREMITY MMT:   8/14 MMT Right Eval Left Eval  Hip  flexion 4 4  Hip extension 4 4  Hip abduction    Hip adduction    Hip internal rotation    Hip external rotation    Knee flexion 4+ 4+  Knee extension 4+ 4+  Ankle dorsiflexion 4+ 4+  Ankle plantarflexion    Ankle inversion    Ankle eversion    (Blank rows = not tested)  BED MOBILITY:  Sit to supine Min A and Mod A Supine to sit Min A and Mod A Requires adjustable bed with bedrail at home   TRANSFERS: Assistive device utilized: Up-Walker  Sit to stand: SBA, CGA, and Min A Stand to sit: SBA, CGA, and Min A Chair to chair: CGA and Min A Floor: Not assessed  STAIRS: Not assessed on 8/15.    From prior PT treatment:  Level of Assistance: 06/12/23: Min A Stair Negotiation Technique: Forwards with Bilateral Rails  Number of Stairs: 4  Height of Stairs: 6inch  Comments: 06/12/23 - posterior LOB at top of steps when turning around, minor posterior lean throughout  GAIT: Gait pattern: step through pattern, decreased step length- Right, decreased step length- Left, decreased stride length, knee flexed in stance- Right, knee flexed in stance- Left, shuffling, festinating, trunk flexed, wide BOS, poor foot clearance- Right, and poor foot clearance- Left Distance walked: ~147ft Assistive device utilized: Up-Walker Level of assistance: CGA and Min A Comments: Noticeable festination when turning, stepping back to sit, or side stepping  FUNCTIONAL TESTS:   5 times sit to stand: 23.5s Timed up and go (TUG): 32.8 sec with 4WW   10 meter walk test: 0.36m/s with 4WW  Berg Balance Scale: no assessed on 8/14  PATIENT SURVEYS:  ABC scale 06/12/23: 30%  TREATMENT DATE:  01/01/24    *** Arrives to therapy in transport chair.  TA and GAIT TRAINING:     Gait with [id number] with 5#AW upright walking x 557ft. Min cues for improved step length and width. No LOB and  maintained hands on handles at all times today.   Forward/reverse gait with instruction for improved step width and length to reduce fall risk 35ft x 6    Sit<>stand x 12 without UE support. Performed x 2 bouts. VC for technique- less cues with practice  initially for anterior weight shift, but improved with repetitions between bouts.   Transfer safety training- walking backward to chair from support bar x 10 and practice correct hand placement with reaching back .   NMR:  Dynamic marching on airex pad (at support bar)- min to no UE support x 20 reps each Dynamic step + forward/diagonal reach (at dry erase board) - reaching for sticky notes labeled 1 and 2 x 15 each Dynamic overhead reach (at dry erase board)- holding green ball and reaching overhead to place ball on top of dry erase board- performing heel raise for added height x 20 reps   Pt left in transport chair with SO at end of session.   PATIENT EDUCATION: Education details: PT POC, LTGs, findings on balance assessments today Person educated: Patient and Caregiver Angelica Education method: Explanation Education comprehension: verbalized understanding and needs further education  HOME EXERCISE PROGRAM:   Access Code: [email or web address] URL: [email or web address] Date: 12/07/2023 Prepared by: Reyes London  Exercises - Sit to Stand with Arms Crossed  - 3 x weekly - 3 sets - 10 reps - Standing March with Counter Support  - 3 x weekly - 3 sets - 10 reps - Standing Hip Abduction with Counter Support  - 3 x weekly - 3 sets - 10 reps - Standing Knee Flexion with Counter Support  - 3 x weekly - 3 sets - 10 reps - Standing Hip Extension with Counter Support  - 3 x weekly - 3 sets - 10 reps - Heel Toe Raises with Unilateral Counter Support  - 3 x weekly - 3 sets - 10 reps - Forward Step Up with Counter Support  - 3 x weekly - 3 sets - 10 reps     (From prior PT episode) Access Code: [email or web address] URL:  [email or web address] Date: 06/28/2023 Prepared by: Connell Kiss  Exercises - Walking  - 1 x daily - 7 x weekly - 2 sets - 3-5 minutes hold - Sit to Stand  - 1 x daily - 7 x weekly - 2 sets - 12-15 reps - Side Stepping with Resistance at Thighs and Counter Support  - 1 x daily - 7 x weekly - 3 sets - 10 reps - Supine Bridge with Resistance Band  - 1 x daily - 7 x weekly - 2 sets - 10-15 reps - Clamshell  - 1 x daily - 7 x weekly - 3 sets - 10 reps  GOALS: Goals reviewed with patient? Yes  SHORT TERM GOALS: Target date: 12/28/2023    Patient will be independent in home exercise program to improve strength/mobility for better functional independence with ADLs. Baseline: non compliant Goal status: INITIAL   2. Patient will report <2 falls over past 6 weeks to demonstrate improved safety awareness at home.  Baseline: > 3 per week  Goal Status: INITIAL   LONG TERM GOALS: Target date: 11/16/2023  1.  Patient (> 67 years old) will complete five times sit  to stand test in < 15 seconds indicating an increased LE strength and improved balance. Baseline: 23.5s Goal status: INITIAL  2.  Patient will increase Berg Balance score by > 6 points to demonstrate decreased fall risk during functional activities. Baseline: 23/56 Goal status: INITIAL    3. Patient will reduce timed up and go to <11 seconds to reduce fall risk and demonstrate improved transfer/gait ability. Baseline:33 sec with 4WW Goal status: INITIAL   4.Patient will increase 10 meter walk test to >1.58m/s as to improve gait speed for better community ambulation and to reduce fall risk. Baseline: 0.44m/s with 4WW Goal status: INITIAL   5.  Patient will require no more than supervision assist with ascending/descending 3 steps using handrails per home set-up.  Baseline: to be assessed   Goal status: INITIAL    ASSESSMENT: CLINICAL IMPRESSION:    *** Treatment focused on large amplitude movement activities and  functional strengthening using 5# AW for resistive gait and patient performed well- no significant fatigue. He does some continued issues at times with motor planning but performs better with practice and verbal/visual cues. Continued safety training to ensure he is using equipment safely and transferring safely. Patient will benefit from continued skilled physical therapy to further address his balance deficits in order to decrease his fall risk and promote increased independence with functional mobility and ADLs.     OBJECTIVE IMPAIRMENTS: Abnormal gait, decreased activity tolerance, decreased balance, decreased cognition, decreased endurance, decreased knowledge of condition, decreased knowledge of use of DME, decreased mobility, difficulty walking, decreased strength, decreased safety awareness, hypomobility, impaired flexibility, impaired sensation, improper body mechanics, postural dysfunction, and pain.   ACTIVITY LIMITATIONS: carrying, lifting, bending, standing, squatting, sleeping, stairs, transfers, bed mobility, continence, bathing, toileting, dressing, reach over head, hygiene/grooming, and locomotion level  PARTICIPATION LIMITATIONS: meal prep, cleaning, laundry, medication management, driving, and community activity  PERSONAL FACTORS: Age, Sex, Time since onset of injury/illness/exacerbation, and 3+ comorbidities: Parkinson's, deep brain stim, depression, lipids, HTN, GERD, ED, dementia, seizures/falls, LBP/PSF, hx of lumbar fusion in 04/2022, hx of hip fx 08/2022, arthritis, frequent falls,  and OSA are also affecting patient's functional outcome.   REHAB POTENTIAL: Good  CLINICAL DECISION MAKING: Evolving/moderate complexity  EVALUATION COMPLEXITY: Moderate  PLAN:  PT FREQUENCY: 1-2x/week  PT DURATION: 12 weeks  PLANNED INTERVENTIONS: 97164- PT Re-evaluation, 97110-Therapeutic exercises, 97530- Therapeutic activity, 97112- Neuromuscular re-education, 97535- Self Care, [id number]-  Manual therapy, [phone]- Gait training, [phone]- Orthotic Fit/training, [phone]- Electrical stimulation (manual), Patient/Family education, Balance training, Stair training, Taping, Joint mobilization, Joint manipulation, Spinal mobilization, Vestibular training, Cognitive remediation, DME instructions, Cryotherapy, and Moist heat  PLAN FOR NEXT SESSION:     *** Continue to REVIEW HEP for Compliance  PD management with improved step width/length/high Large amplitude movement training.     Fonda Simpers, PT, DPT Physical Therapist - [facility]  01/01/24, 9:28 AM

## 2024-01-02 ENCOUNTER — Ambulatory Visit: Admitting: Psychology

## 2024-01-02 NOTE — Progress Notes (Signed)
                Sayaka Hoeppner G Sharra Cayabyab, LCSW

## 2024-01-04 ENCOUNTER — Ambulatory Visit: Admitting: Physical Therapy

## 2024-01-08 ENCOUNTER — Ambulatory Visit: Admitting: Physical Therapy

## 2024-01-11 ENCOUNTER — Ambulatory Visit

## 2024-01-15 ENCOUNTER — Ambulatory Visit: Admitting: Physical Therapy

## 2024-01-18 ENCOUNTER — Ambulatory Visit: Admitting: Physical Therapy

## 2024-01-22 ENCOUNTER — Ambulatory Visit: Admitting: Physical Therapy

## 2024-01-25 ENCOUNTER — Ambulatory Visit: Admitting: Physical Therapy

## 2024-01-29 ENCOUNTER — Ambulatory Visit: Admitting: Physical Therapy

## 2024-02-01 ENCOUNTER — Ambulatory Visit: Admitting: Physical Therapy

## 2024-02-05 ENCOUNTER — Ambulatory Visit: Admitting: Physical Therapy

## 2024-02-08 ENCOUNTER — Ambulatory Visit: Admitting: Physical Therapy

## 2024-02-12 ENCOUNTER — Ambulatory Visit: Admitting: Physical Therapy

## 2024-02-13 ENCOUNTER — Ambulatory Visit: Admitting: Psychology

## 2024-02-13 NOTE — Progress Notes (Unsigned)
                edge,  experiencing concentration difficulties, having trouble falling or staying asleep, exhibiting a general  state of irritability).: No Description Entered (Status: improved). Motor tension (e.g., restlessness,  tiredness, shakiness, muscle tension).: No Description Entered (Status: improved).  Problems Addressed  Anxiety, Phase Of Life Problems, Anxiety  Goals 1. Learn and implement coping skills that result in a reduction of anxiety  and worry, and improved daily functioning. Objective Learn  and implement calming skills to reduce overall anxiety and manage anxiety symptoms. Target Date: 2025-08-09Frequency: Weekly Progress: 40 Modality: individual  Related Interventions 1. Teach the client calming/relaxation skills (e.g., applied relaxation, progressive muscle  relaxation, cue controlled relaxation; mindful breathing; biofeedback) and how to discriminate  better between relaxation and tension; teach the client how to apply these skills to his/her daily  life (e.g., New Directions in Progressive Muscle Relaxation by Marcelyn Ditty, and  Hazlett-Stevens; Treating Generalized Anxiety Disorder by Rygh and Ida Rogue). Objective Identify, challenge, and replace biased, fearful self-talk with positive, realistic, and empowering selftalk. Target Date: 2023-11-25 Frequency: weekly Progress: 30 Modality: individual Related Interventions 1. Explore the client's schema and self-talk that mediate his/her fear response; assist him/her in  challenging the biases; replace the distorted messages with reality-based alternatives and  positive, realistic self-talk that will increase his/her self-confidence in coping with irrational  fears (see Cognitive Therapy of Anxiety Disorders by Laurence Slate). Objective Learn and implement problem-solving strategies for realistically addressing worries. Target Date: 2025-08-09Frequency: weekly Progress: 40 Modality: individual 2. Resolve conflicted feelings and adapt to the new life circumstances. Objective Apply problem-solving skills to current circumstances. Target Date: 2023-11-25 Frequency: weekly Progress: 20 Modality: individual Related Interventions 1. Teach the client problem-resolution skills (e.g., defining the problem clearly, brainstorming  multiple solutions, listing the pros and cons of each solution, seeking input from others,  selecting and implementing a plan of action, evaluating outcome, and readjusting plan as   necessary).   3. Stabilize anxiety level while increasing ability to function on a daily  basis. Diagnosis F33.1  Major depressive disorder, moderate 300.02 (Generalized anxiety disorder) - Open - [Signifier: n/a]  Axis  none 309.28 (Adjustment disorder with mixed anxiety and depressed  mood) - Open - [Signifier: n/a]  Adjustment Disorder,  With Anxiety   Marital conflict  Major Depressive disorder, moderate  Conditions For Discharge Achievement of treatment goals and objectives.  The patient approved this plan.   Deonna Krummel G Ethridge Sollenberger, LCSW

## 2024-02-15 ENCOUNTER — Ambulatory Visit: Admitting: Physical Therapy

## 2024-02-15 DIAGNOSIS — D6949 Other primary thrombocytopenia: Secondary | ICD-10-CM | POA: Diagnosis not present

## 2024-02-15 DIAGNOSIS — G3183 Dementia with Lewy bodies: Secondary | ICD-10-CM | POA: Diagnosis not present

## 2024-02-15 DIAGNOSIS — F320 Major depressive disorder, single episode, mild: Secondary | ICD-10-CM | POA: Diagnosis not present

## 2024-02-15 DIAGNOSIS — M1990 Unspecified osteoarthritis, unspecified site: Secondary | ICD-10-CM | POA: Diagnosis not present

## 2024-02-15 DIAGNOSIS — K219 Gastro-esophageal reflux disease without esophagitis: Secondary | ICD-10-CM | POA: Diagnosis not present

## 2024-02-15 DIAGNOSIS — I495 Sick sinus syndrome: Secondary | ICD-10-CM | POA: Diagnosis not present

## 2024-02-15 DIAGNOSIS — I10 Essential (primary) hypertension: Secondary | ICD-10-CM | POA: Diagnosis not present

## 2024-02-15 DIAGNOSIS — G20A1 Parkinson's disease without dyskinesia, without mention of fluctuations: Secondary | ICD-10-CM | POA: Diagnosis not present

## 2024-02-15 DIAGNOSIS — I700 Atherosclerosis of aorta: Secondary | ICD-10-CM | POA: Diagnosis not present

## 2024-02-19 ENCOUNTER — Ambulatory Visit: Admitting: Physical Therapy

## 2024-02-19 ENCOUNTER — Ambulatory Visit: Payer: Self-pay

## 2024-02-22 ENCOUNTER — Ambulatory Visit: Admitting: Physical Therapy

## 2024-03-26 ENCOUNTER — Ambulatory Visit: Admitting: Psychology

## 2024-04-04 ENCOUNTER — Ambulatory Visit: Attending: Physician Assistant

## 2024-04-04 ENCOUNTER — Ambulatory Visit

## 2024-04-04 DIAGNOSIS — R2681 Unsteadiness on feet: Secondary | ICD-10-CM | POA: Insufficient documentation

## 2024-04-04 DIAGNOSIS — R2689 Other abnormalities of gait and mobility: Secondary | ICD-10-CM | POA: Insufficient documentation

## 2024-04-04 DIAGNOSIS — R296 Repeated falls: Secondary | ICD-10-CM | POA: Diagnosis present

## 2024-04-04 DIAGNOSIS — Z7409 Other reduced mobility: Secondary | ICD-10-CM | POA: Diagnosis present

## 2024-04-04 DIAGNOSIS — Z789 Other specified health status: Secondary | ICD-10-CM | POA: Diagnosis present

## 2024-04-04 DIAGNOSIS — G20A1 Parkinson's disease without dyskinesia, without mention of fluctuations: Secondary | ICD-10-CM

## 2024-04-04 DIAGNOSIS — R278 Other lack of coordination: Secondary | ICD-10-CM

## 2024-04-04 DIAGNOSIS — M6281 Muscle weakness (generalized): Secondary | ICD-10-CM | POA: Diagnosis present

## 2024-04-04 DIAGNOSIS — R262 Difficulty in walking, not elsewhere classified: Secondary | ICD-10-CM

## 2024-04-04 DIAGNOSIS — R269 Unspecified abnormalities of gait and mobility: Secondary | ICD-10-CM | POA: Insufficient documentation

## 2024-04-04 NOTE — Therapy (Signed)
 OUTPATIENT PHYSICAL THERAPY NEURO EVALUATION   Patient Name: [name] MRN: [id number] DOB:[birth year], 73 y.o., male Today's Date: 04/04/2024   PCP: Sid Sieving, PA-C  REFERRING PROVIDER: Jacumin, Erika Leigh, PA   END OF SESSION:  PT End of Session - 04/04/24 1614     Visit Number 1    Date for Recertification  06/27/24    Progress Note Due on Visit 10    PT Start Time 1616    PT Stop Time 1700    PT Time Calculation (min) 44 min          Past Medical History:  Diagnosis Date   Anxiety disorder    Arthritis    Bradycardia    Cancer (HCC) 2013   skin cancer   Depression    ptsd   Dysrhythmia    chronic slow heart rate   GERD (gastroesophageal reflux disease)    H/O tachycardia-bradycardia syndrome    Headache(784.0)    tension headaches non recent   History of chicken pox    History of kidney stones    passed   Hypertension    treated with HCTZ   Major depressive disorder    Mild aortic regurgitation    Pacemaker    Parkinson's disease (HCC)    dx'ed 15 years ago   PTSD (post-traumatic stress disorder)    S/P deep brain stimulator placement    Shortness of breath dyspnea    Sleep apnea    doesn't use C-pap   Unspecified dementia, moderate, with psychotic disturbance (HCC)    Varicose veins    Past Surgical History:  Procedure Laterality Date   CATARACT EXTRACTION W/PHACO Right 01/17/2023   Procedure: CATARACT EXTRACTION PHACO AND INTRAOCULAR LENS PLACEMENT (IOC) RIGHT 6.33 00:42.6;  Surgeon: Jaye Fallow, MD;  Location: [facility];  Service: Ophthalmology;  Laterality: Right;   CATARACT EXTRACTION W/PHACO Left 02/07/2023   Procedure: CATARACT EXTRACTION PHACO AND INTRAOCULAR LENS PLACEMENT (IOC) LEFT 6.35 00:38.3;  Surgeon: Jaye Fallow, MD;  Location: [facility];  Service: Ophthalmology;  Laterality: Left;   CHOLECYSTECTOMY N/A 10/22/2014   Procedure: LAPAROSCOPIC CHOLECYSTECTOMY WITH INTRAOPERATIVE CHOLANGIOGRAM;   Surgeon: Elgin Laurence III, MD;  Location: [facility];  Service: General;  Laterality: N/A;   cyst removed      from lip as a child   INTRAMEDULLARY (IM) NAIL INTERTROCHANTERIC N/A 02/18/2019   Procedure: INTRAMEDULLARY (IM) NAIL INTERTROCHANTRIC, RIGHT,;  Surgeon: Marchia Drivers, MD;  Location: [facility];  Service: Orthopedics;  Laterality: N/A;   LUMBAR LAMINECTOMY/DECOMPRESSION MICRODISCECTOMY Bilateral 12/14/2012   Procedure: Bilateral lumbar three-four, four-five decompressive laminotomy/foraminotomy;  Surgeon: Victory DELENA Gunnels, MD;  Location: [facility];  Service: Neurosurgery;  Laterality: Bilateral;   PULSE GENERATOR IMPLANT Bilateral 12/13/2013   Procedure: Bilateral implantable pulse generator placement;  Surgeon: Fairy Levels, MD;  Location: [facility];  Service: Neurosurgery;  Laterality: Bilateral;  Bilateral implantable pulse generator placement   skin cancer removed     from ears,   12 lft arm  rt leg 15   SUBTHALAMIC STIMULATOR BATTERY REPLACEMENT Bilateral 07/14/2017   Procedure: BILATERAL IMPLANTED PULSE GENERATOR CHANGE FOR DEEP BRAIN STIMULATOR;  Surgeon: Levels Fairy, MD;  Location: [facility];  Service: Neurosurgery;  Laterality: Bilateral;   SUBTHALAMIC STIMULATOR INSERTION Bilateral 12/06/2013   Procedure: SUBTHALAMIC STIMULATOR INSERTION;  Surgeon: Fairy Levels, MD;  Location: [facility];  Service: Neurosurgery;  Laterality: Bilateral;  Bilateral deep brain stimulator placement   Patient Active Problem List   Diagnosis Date  Noted   Hematuria 10/10/2021   Sinus drainage 12/09/2020   Medicare annual wellness visit, subsequent 11/18/2020   Pacemaker    Dementia associated with Parkinson's disease (HCC) 06/17/2020   Vertigo 02/19/2020   Urine abnormality 02/19/2020   Dysphagia 02/23/2019   Fall at home 10/18/2017   REM behavioral disorder 11/02/2016   Radicular pain in right arm 10/09/2016   GERD (gastroesophageal reflux disease) 07/31/2016   Colon cancer screening 07/23/2015    Gout 02/18/2015   Depression 01/06/2015   Skin lesion 10/02/2014   Dupuytren's contracture 08/20/2014   Cough 07/21/2014   Lumbar stenosis with neurogenic claudication 06/13/2014   S/P deep brain stimulator placement 05/08/2014   Advance care planning 01/19/2014   Parkinson's disease (HCC) 12/13/2013   PTSD (post-traumatic stress disorder) 06/13/2013   Erectile dysfunction 06/13/2013   HLD (hyperlipidemia) 06/13/2013   Essential hypertension 06/03/2013   Bradycardia by electrocardiogram 06/03/2013   Obstructive sleep apnea 03/12/2013    ONSET DATE: September of 2025 (recent hospitalization).   REFERRING DIAG: Parkinson's disease without dyskinesia, with fluctuations   THERAPY DIAG:  Unsteadiness on feet  Difficulty in walking, not elsewhere classified  Muscle weakness (generalized)  Other lack of coordination  Repeated falls  Other abnormalities of gait and mobility  Parkinson's disease without dyskinesia or fluctuating manifestations (HCC)  Abnormality of gait and mobility  Rationale for Evaluation and Treatment: Rehabilitation  SUBJECTIVE:                                                                                                                                                                                             SUBJECTIVE STATEMENT: Patient arrived to PT evaluation with his wife and caretaker which assisted with subjective portion of evaluation. Pt reports having difficulty with balance/walking following a recent hospitalization. Patient had a UTI in the first week of September resulting in 2 week hospitalization. He then returned home for 2 days and returned back to hospital due to sepsis and was  in hospital an additional 2 weeks. He reports he had kidney stone removed in first week of November as well.   Pt uses a stand up walker for gait. He reports having a fall last week and that he falls more often than he would like. Patient also reports having  weakness in his lower extremities which could be contributing to his falls.  Pt has been diagnosed with PD for about 15 years now.  Patient is having battery exchange on brain stimulator 04/23/2024.  Patient has foley catheter since September and is hoping to get it removed next Tuesday. -Patient's wife reports his mobility/independence  is dependent  on the day as some days he is more independent than others. She reports days where he will just get up and walk without assistance (unsafely) and other times he has difficulty sitting up straight.    Pt accompanied by: Wife and caretaker.  PERTINENT HISTORY:  Parkinson's, deep brain stim, depression, lipids, HTN, GERD, ED, dementia, seizures/falls, LBP/PSF, hx of lumbar fusion in 04/2022, hx of hip fx 08/2022, arthritis, frequent falls,  and OSA   PAIN:  Are you having pain?  Yes, low back and L shoulder due to falls.  PRECAUTIONS: Fall  RED FLAGS: None   WEIGHT BEARING RESTRICTIONS: No  FALLS: Has patient fallen in last 6 months? Yes. Number of falls Unknown (frequent).   LIVING ENVIRONMENT: Lives with: lives with their spouse Lives in: House/apartment Stairs: External: 2-3 steps; bilateral but cannot reach both (due to current home renovations, this is the entrance he uses) primarily uses R HR  Has following equipment at home: Wheelchair (manual), shower chair, Grab bars, and Up-Walker.  PLOF: Needs assistance with ADLs, Needs assistance with gait, and Needs assistance with transfers  PATIENT GOALS: Improve LE strength/Improve balance.  OBJECTIVE:  Note: Objective measures were completed at Evaluation unless otherwise noted.  DIAGNOSTIC FINDINGS: CLINICAL DATA:  Fall.  Left shoulder pain.   EXAM: LEFT SHOULDER - 2+ VIEW   COMPARISON:  11/21/2019.   FINDINGS: There is a mildly displaced acute fracture of the superolateral aspect of the left clavicle with intra-articular extension.   No other acute fracture or dislocation.    No aggressive osseous lesion.   Glenohumeral and acromioclavicular joints are normal in alignment and exhibit mild degenerative changes.   No soft tissue swelling.   No radiopaque foreign bodies.   IMPRESSION: Acute mildly displaced fracture of the lateral aspect of the left clavicle with intra-articular extension.     Electronically Signed   By: Ree Molt M.D.   On: 05/22/2023 15:27  COGNITION: Overall cognitive status: Cognitive impairments at baseline and pt with very delayed verbal responses to subjective questioning and noted to be easily distracted in the therapy gym environment    SENSATION: WFL  COORDINATION: Decreased coordination with toe tapping/heel to shin.     POSTURE: rounded shoulders, forward head.  LOWER EXTREMITY ROM:     Active  Right Eval Left Eval  Hip flexion    Hip extension    Hip abduction    Hip adduction    Hip internal rotation    Hip external rotation    Knee flexion    Knee extension    Ankle dorsiflexion    Ankle plantarflexion    Ankle inversion    Ankle eversion     (Blank rows = not tested)  LOWER EXTREMITY MMT:    MMT Right Eval Left Eval  Hip flexion 4 4  Hip extension    Hip abduction 4- 4  Hip adduction 5 5  Hip internal rotation    Hip external rotation    Knee flexion 4+ 4+  Knee extension 4+ 4+  Ankle dorsiflexion 5 5  Ankle plantarflexion 5 5  Ankle inversion    Ankle eversion    (Blank rows = not tested)  BED MOBILITY:  SBA TRANSFERS: SBA  RAMP:  Not tested  CURB:  Not tested  STAIRS: Not tested GAIT: Findings: Distance walked: 24' CGA/MIN. A. Using upright walker and Comments: Scissoring gait with decreased step length.   FUNCTIONAL TESTS:  5 times sit to stand: 15.45'' Timed up  and go (TUG): 28.53 10 meter walk test: To be tested.  Berg Balance Scale: To be tested.                                                                                                                                 TREATMENT DATE: 04/04/2024    PATIENT EDUCATION: Education details:  PT POC, LTGs, findings on gait assessments. Person educated: Patient, Spouse, and Caregiver   Education method: Explanation Education comprehension: verbalized understanding and needs further education  HOME EXERCISE PROGRAM: To be provided at initial treatment sessions.   GOALS: Goals reviewed with patient? Yes  SHORT TERM GOALS: Target date: 04/18/2024  Patient will be independent with HEP in 2 weeks allowing him to perform his exercises at home with improved safety and proper mechanics.  Baseline: HEP to be provided upon initial visits.  Goal status: INITIAL   LONG TERM GOALS: Target date: 06/27/2024   1.  Patient will increase Berg Balance score by > 6 points to demonstrate decreased fall risk during functional activities. Baseline: To be tested Goal status: INITIAL   2.  Patient will reduce timed up and go to <11 seconds to reduce fall risk and demonstrate improved transfer/gait ability. Baseline: 28.53 with standing walker CGA. Goal status: INITIAL  3.  Patient will increase 10 meter walk test to >1.22m/s as to improve gait speed for better community ambulation and to reduce fall risk. Baseline: To be tested Goal status: INITIAL  4.  Patient will demonstrate ability to perform stairs safely with SBA by discharge allowing him to go into/out of home with decreased difficulty.  Baseline: To be assessed.  Goal status: INITIAL  5. Patient will demonstrate ability to ambulate short distances (50') using least restrictive device with SBA by discharge allowing him to ambulate inside home with reduced risk of falls. BASELINE: CGA/MIN. A.      ASSESSMENT:  CLINICAL IMPRESSION: Patient is a 73 y.o. male who was seen today for physical therapy evaluation and treatment for decreased LE strength/balance. Patient was recently hospitalized following a UTI/sepsis for 4 weeks in hospital  resulting in deconditioning. He since has frequent falls at home and difficulty with overall mobility. Patient did need assistance prior to hospitalization with ADLs and gait at times as he has Parkinson's Diagnosis. During today's assessment, patient was noted to have decreased LE strength, abnormal gait, decreased safety with transfers/gait, decreased balance, and decreased independence with gait. Patient may benefit from skilled PT to improve on deficits listed above for reduced risk of falls and increased independence with gait/functional mobility.    OBJECTIVE IMPAIRMENTS: Abnormal gait, decreased activity tolerance, decreased balance, decreased cognition, decreased coordination, decreased endurance, decreased knowledge of use of DME, decreased mobility, difficulty walking, decreased strength, decreased safety awareness, improper body mechanics, and postural dysfunction.   ACTIVITY LIMITATIONS: carrying, lifting, bending, sitting, standing, squatting, stairs, transfers, and bed mobility  PARTICIPATION LIMITATIONS: meal prep, cleaning, laundry, medication management,  interpersonal relationship, driving, shopping, and community activity  PERSONAL FACTORS: Parkinson's, deep brain stim, depression, lipids, HTN, GERD, ED, dementia, seizures/falls, LBP/PSF, hx of lumbar fusion in 04/2022, hx of hip fx 08/2022, arthritis, frequent falls,  and OSA are also affecting patient's functional outcome.  are also affecting patient's functional outcome.   REHAB POTENTIAL: Good  CLINICAL DECISION MAKING: Evolving/moderate complexity  EVALUATION COMPLEXITY: Moderate  PLAN:  PT FREQUENCY: 1-2x/week  PT DURATION: 12 weeks  PLANNED INTERVENTIONS: 97164- PT Re-evaluation, 97750- Physical Performance Testing, 97110-Therapeutic exercises, 97530- Therapeutic activity, 97112- Neuromuscular re-education, 97535- Self Care, [id number]- Manual therapy, [phone]- Gait training, Patient/Family education, Balance training, Stair  training, Vestibular training, Wheelchair mobility training, Cryotherapy, and Moist heat  PLAN FOR NEXT SESSION:  -Assess Stairs - -Improve safety with gait/transfers -Improve balance/LE strength with progressive exercises.    Norman Sharps, PT, DPT Physical Therapist - [facility]  Norman KATHEE Sharps, [place] 04/04/2024, 5:07 PM

## 2024-04-04 NOTE — Therapy (Unsigned)
 OUTPATIENT OCCUPATIONAL THERAPY NEURO EVALUATION  Patient Name: [name] MRN: [id number] DOB:1951-01-24, 73 y.o., male Today's Date: 04/04/2024  PCP: Toribio Messier REFERRING PROVIDER: Jacumin, Erika Leigh, PA  END OF SESSION:   Past Medical History:  Diagnosis Date   Anxiety disorder    Arthritis    Bradycardia    Cancer (HCC) 2013   skin cancer   Depression    ptsd   Dysrhythmia    chronic slow heart rate   GERD (gastroesophageal reflux disease)    H/O tachycardia-bradycardia syndrome    Headache(784.0)    tension headaches non recent   History of chicken pox    History of kidney stones    passed   Hypertension    treated with HCTZ   Major depressive disorder    Mild aortic regurgitation    Pacemaker    Parkinson's disease (HCC)    dx'ed 15 years ago   PTSD (post-traumatic stress disorder)    S/P deep brain stimulator placement    Shortness of breath dyspnea    Sleep apnea    doesn't use C-pap   Unspecified dementia, moderate, with psychotic disturbance (HCC)    Varicose veins    Past Surgical History:  Procedure Laterality Date   CATARACT EXTRACTION W/PHACO Right 01/17/2023   Procedure: CATARACT EXTRACTION PHACO AND INTRAOCULAR LENS PLACEMENT (IOC) RIGHT 6.33 00:42.6;  Surgeon: Jaye Fallow, MD;  Location: [facility];  Service: Ophthalmology;  Laterality: Right;   CATARACT EXTRACTION W/PHACO Left 02/07/2023   Procedure: CATARACT EXTRACTION PHACO AND INTRAOCULAR LENS PLACEMENT (IOC) LEFT 6.35 00:38.3;  Surgeon: Jaye Fallow, MD;  Location: [facility];  Service: Ophthalmology;  Laterality: Left;   CHOLECYSTECTOMY N/A 10/22/2014   Procedure: LAPAROSCOPIC CHOLECYSTECTOMY WITH INTRAOPERATIVE CHOLANGIOGRAM;  Surgeon: Elgin Laurence III, MD;  Location: [facility];  Service: General;  Laterality: N/A;   cyst removed      from lip as a child   INTRAMEDULLARY (IM) NAIL INTERTROCHANTERIC N/A 02/18/2019   Procedure: INTRAMEDULLARY (IM) NAIL  INTERTROCHANTRIC, RIGHT,;  Surgeon: Marchia Drivers, MD;  Location: [facility];  Service: Orthopedics;  Laterality: N/A;   LUMBAR LAMINECTOMY/DECOMPRESSION MICRODISCECTOMY Bilateral 12/14/2012   Procedure: Bilateral lumbar three-four, four-five decompressive laminotomy/foraminotomy;  Surgeon: Victory DELENA Gunnels, MD;  Location: [facility];  Service: Neurosurgery;  Laterality: Bilateral;   PULSE GENERATOR IMPLANT Bilateral 12/13/2013   Procedure: Bilateral implantable pulse generator placement;  Surgeon: Fairy Levels, MD;  Location: [facility];  Service: Neurosurgery;  Laterality: Bilateral;  Bilateral implantable pulse generator placement   skin cancer removed     from ears,   12 lft arm  rt leg 15   SUBTHALAMIC STIMULATOR BATTERY REPLACEMENT Bilateral 07/14/2017   Procedure: BILATERAL IMPLANTED PULSE GENERATOR CHANGE FOR DEEP BRAIN STIMULATOR;  Surgeon: Levels Fairy, MD;  Location: [facility];  Service: Neurosurgery;  Laterality: Bilateral;   SUBTHALAMIC STIMULATOR INSERTION Bilateral 12/06/2013   Procedure: SUBTHALAMIC STIMULATOR INSERTION;  Surgeon: Fairy Levels, MD;  Location: [facility];  Service: Neurosurgery;  Laterality: Bilateral;  Bilateral deep brain stimulator placement   Patient Active Problem List   Diagnosis Date Noted   Hematuria 10/10/2021   Sinus drainage 12/09/2020   Medicare annual wellness visit, subsequent 11/18/2020   Pacemaker    Dementia associated with Parkinson's disease (HCC) 06/17/2020   Vertigo 02/19/2020   Urine abnormality 02/19/2020   Dysphagia 02/23/2019   Fall at home 10/18/2017   REM behavioral disorder 11/02/2016   Radicular pain in right arm 10/09/2016   GERD (  gastroesophageal reflux disease) 07/31/2016   Colon cancer screening 07/23/2015   Gout 02/18/2015   Depression 01/06/2015   Skin lesion 10/02/2014   Dupuytren's contracture 08/20/2014   Cough 07/21/2014   Lumbar stenosis with neurogenic claudication 06/13/2014   S/P deep brain stimulator placement  05/08/2014   Advance care planning 01/19/2014   Parkinson's disease (HCC) 12/13/2013   PTSD (post-traumatic stress disorder) 06/13/2013   Erectile dysfunction 06/13/2013   HLD (hyperlipidemia) 06/13/2013   Essential hypertension 06/03/2013   Bradycardia by electrocardiogram 06/03/2013   Obstructive sleep apnea 03/12/2013   ONSET DATE: ***  REFERRING DIAG: ***  THERAPY DIAG:  No diagnosis found.  Rationale for Evaluation and Treatment: Rehabilitation  SUBJECTIVE:   SUBJECTIVE STATEMENT: It's been rough.  Pt accompanied by: self, significant other, and caregiver, Rosaline  PERTINENT HISTORY: Sept 2 weeks in the hospital , came home 2 days back sepsis 2 more weeks   PRECAUTIONS: Fall  WEIGHT BEARING RESTRICTIONS: No  PAIN:  Are you having pain? Yes: NPRS scale: 6/10 FACES scale, L shoulder and back, but pt unable to be specific on level of pain in back  Pain location: *** Pain description: unable to describe  Aggravating factors:   Relieving factors: ***  FALLS: Has patient fallen in last 6 months? Caregiver estimates 2 in the last month, multiple over the last 6 months/uncertain   LIVING ENVIRONMENT: Caregiver Rosaline present 40 hours a week, alternating between 5 and 7 days a week Lives with: lives with their spouse Lives in: 1 level home Stairs: Yes: External: 2 steps; can reach both, ramp access in the back  Has following equipment at home: Wheelchair (manual), shower chair, Grab bars, Ramped entry, and stand up walker  PLOF: Needs assistance with ADLs  PATIENT GOALS: Trying to get his hands, fork and spoon, shoes and socks by himself,   OBJECTIVE:  Note: Objective measures were completed at Evaluation unless otherwise noted.  HAND DOMINANCE: Right  ADLs: Overall ADLs: *** Transfers/ambulation related to ADLs: depending on the day, can fluctuate between CGA-mod A  Eating: caregiver estimates assist to cut food about 20% of the time, frequent dropping of  standard utensils, had been using plastic silverware for light weight Grooming: set up to comb hair, set up for brushing teeth; dep for shaving  UB Dressing: mod A for donning a tshirt; assist needed for buttoning  LB Dressing: max A; pt can lift feet into pants while seated; inconsistent to don shoes, able to don 1 sock today, per caregiver  Toileting: currently has a catheter; pt follows up on Tues; very occasionally has accidents with bowels; mod A for peri hygiene  Bathing: Max A; pt tries but caregiver completes for thoroughness.   Tub Shower transfers: CGA with grab bars  Equipment: Shower seat with back, Grab bars, Walk in shower, and elevated toilet seat with toilet safety rails   IADLs: Shopping: *** Light housekeeping: *** Meal Prep: *** Community mobility: *** Medication management: *** Financial management: *** Handwriting: {OTWRITTENEXPRESSION:25361}  MOBILITY STATUS: {OTMOBILITY:25360}  POSTURE COMMENTS:  rounded shoulders Sitting balance: {sitting balance:25483}  ACTIVITY TOLERANCE: Activity tolerance: ***  FUNCTIONAL OUTCOME MEASURES: {OTFUNCTIONALMEASURES:27238}  UPPER EXTREMITY ROM:  BUE ROM WFL   Active ROM Right eval Left eval  Shoulder flexion    Shoulder abduction    Shoulder adduction    Shoulder extension    Shoulder internal rotation    Shoulder external rotation    Elbow flexion    Elbow extension    Wrist flexion  Wrist extension    Wrist ulnar deviation    Wrist radial deviation    Wrist pronation    Wrist supination    (Blank rows = not tested)  UPPER EXTREMITY MMT:     MMT Right eval Left eval  Shoulder flexion pain 5  Shoulder abduction pain 5  Shoulder adduction    Shoulder extension    Shoulder internal rotation    Shoulder external rotation    Middle trapezius    Lower trapezius    Elbow flexion 5   Elbow extension 5   Wrist flexion 5   Wrist extension 5   Wrist ulnar deviation    Wrist radial deviation    Wrist  pronation    Wrist supination    (Blank rows = not tested)  HAND FUNCTION: Grip strength: Right: 63 lbs; Left: 63 lbs, Lateral pinch: Right: 18 lbs, Left: 20 lbs, and 3 point pinch: Right: 13 lbs, Left: 14 lbs  COORDINATION: 9 Hole Peg test: Right: 49 sec; Left: 43 sec (increased time d/t following directions)   SENSATION: {sensation:27233}  EDEMA: ***  MUSCLE TONE: {UETONE:25567}  COGNITION: Overall cognitive status: {cognition:24006}  VISION: Subjective report: *** Baseline vision: {OTBASELINEVISION:25363} Visual history: {OTVISUALHISTORY:25364}  VISION ASSESSMENT: {visionassessment:27231}  Patient has difficulty with following activities due to following visual impairments: ***  PERCEPTION: {Perception:25564}  PRAXIS: {Praxis:25565}  OBSERVATIONS: ***                                                                                                                             TREATMENT DATE: ***         PATIENT EDUCATION: Education details: *** Person educated: {Person educated:25204} Education method: {Education Method:25205} Education comprehension: {Education Comprehension:25206}  HOME EXERCISE PROGRAM: ***   GOALS: Goals reviewed with patient? {yes/no:20286}  SHORT TERM GOALS: Target date: ***  *** Baseline: Goal status: INITIAL  2.  *** Baseline:  Goal status: INITIAL  3.  *** Baseline:  Goal status: INITIAL  4.  *** Baseline:  Goal status: INITIAL  5.  *** Baseline:  Goal status: INITIAL  6.  *** Baseline:  Goal status: INITIAL  LONG TERM GOALS: Target date: ***  *** Baseline:  Goal status: INITIAL  2.  *** Baseline:  Goal status: INITIAL  3.  *** Baseline:  Goal status: INITIAL  4.  *** Baseline:  Goal status: INITIAL  5.  *** Baseline:  Goal status: INITIAL  6.  *** Baseline:  Goal status: INITIAL  ASSESSMENT:  CLINICAL IMPRESSION: Patient is a *** y.o. *** who was seen today for occupational  therapy evaluation for ***.   PERFORMANCE DEFICITS: in functional skills including {OT physical skills:25468}, cognitive skills including {OT cognitive skills:25469}, and psychosocial skills including {OT psychosocial skills:25470}.   IMPAIRMENTS: are limiting patient from {OT performance deficits:25471}.   CO-MORBIDITIES: {Comorbidities:25485} that affects occupational performance. Patient will benefit from skilled OT to address above impairments and improve overall function.  MODIFICATION OR ASSISTANCE  TO COMPLETE EVALUATION: {OT modification:25474}  OT OCCUPATIONAL PROFILE AND HISTORY: {OT PROFILE AND HISTORY:25484}  CLINICAL DECISION MAKING: {OT CDM:25475}  REHAB POTENTIAL: {rehabpotential:25112}  EVALUATION COMPLEXITY: {Evaluation complexity:25115}    PLAN:  OT FREQUENCY: {rehab frequency:25116}  OT DURATION: {rehab duration:25117}  PLANNED INTERVENTIONS: {OT Interventions:25467}  RECOMMENDED OTHER SERVICES: ***  CONSULTED AND AGREED WITH PLAN OF CARE: {ENR:74513}  PLAN FOR NEXT SESSION: ***   Inocente MARLA Blazing, OT 04/04/2024, 3:36 PM

## 2024-04-08 ENCOUNTER — Ambulatory Visit

## 2024-04-08 DIAGNOSIS — G20A1 Parkinson's disease without dyskinesia, without mention of fluctuations: Secondary | ICD-10-CM | POA: Diagnosis not present

## 2024-04-08 DIAGNOSIS — R278 Other lack of coordination: Secondary | ICD-10-CM

## 2024-04-08 DIAGNOSIS — R2681 Unsteadiness on feet: Secondary | ICD-10-CM

## 2024-04-08 DIAGNOSIS — R262 Difficulty in walking, not elsewhere classified: Secondary | ICD-10-CM

## 2024-04-08 DIAGNOSIS — M6281 Muscle weakness (generalized): Secondary | ICD-10-CM

## 2024-04-08 NOTE — Therapy (Signed)
 " OUTPATIENT PHYSICAL THERAPY NEURO TREATMENT   Patient Name: [name] MRN: [id number] DOB:15-Mar-1951, 73 y.o., male Today's Date: 04/08/2024   PCP: Sid Sieving, PA-C  REFERRING PROVIDER: Jacumin, Erika Leigh, PA   END OF SESSION:  PT End of Session - 04/08/24 1311     Visit Number 2    Date for Recertification  06/27/24    Progress Note Due on Visit 10    PT Start Time 1314    PT Stop Time 1358    PT Time Calculation (min) 44 min           Past Medical History:  Diagnosis Date   Anxiety disorder    Arthritis    Bradycardia    Cancer (HCC) 2013   skin cancer   Depression    ptsd   Dysrhythmia    chronic slow heart rate   GERD (gastroesophageal reflux disease)    H/O tachycardia-bradycardia syndrome    Headache(784.0)    tension headaches non recent   History of chicken pox    History of kidney stones    passed   Hypertension    treated with HCTZ   Major depressive disorder    Mild aortic regurgitation    Pacemaker    Parkinson's disease (HCC)    dx'ed 15 years ago   PTSD (post-traumatic stress disorder)    S/P deep brain stimulator placement    Shortness of breath dyspnea    Sleep apnea    doesn't use C-pap   Unspecified dementia, moderate, with psychotic disturbance (HCC)    Varicose veins    Past Surgical History:  Procedure Laterality Date   CATARACT EXTRACTION W/PHACO Right 01/17/2023   Procedure: CATARACT EXTRACTION PHACO AND INTRAOCULAR LENS PLACEMENT (IOC) RIGHT 6.33 00:42.6;  Surgeon: Jaye Fallow, MD;  Location: [facility];  Service: Ophthalmology;  Laterality: Right;   CATARACT EXTRACTION W/PHACO Left 02/07/2023   Procedure: CATARACT EXTRACTION PHACO AND INTRAOCULAR LENS PLACEMENT (IOC) LEFT 6.35 00:38.3;  Surgeon: Jaye Fallow, MD;  Location: [facility];  Service: Ophthalmology;  Laterality: Left;   CHOLECYSTECTOMY N/A 10/22/2014   Procedure: LAPAROSCOPIC CHOLECYSTECTOMY WITH INTRAOPERATIVE CHOLANGIOGRAM;   Surgeon: Elgin Laurence III, MD;  Location: [facility];  Service: General;  Laterality: N/A;   cyst removed      from lip as a child   INTRAMEDULLARY (IM) NAIL INTERTROCHANTERIC N/A 02/18/2019   Procedure: INTRAMEDULLARY (IM) NAIL INTERTROCHANTRIC, RIGHT,;  Surgeon: Marchia Drivers, MD;  Location: [facility];  Service: Orthopedics;  Laterality: N/A;   LUMBAR LAMINECTOMY/DECOMPRESSION MICRODISCECTOMY Bilateral 12/14/2012   Procedure: Bilateral lumbar three-four, four-five decompressive laminotomy/foraminotomy;  Surgeon: Victory DELENA Gunnels, MD;  Location: [facility];  Service: Neurosurgery;  Laterality: Bilateral;   PULSE GENERATOR IMPLANT Bilateral 12/13/2013   Procedure: Bilateral implantable pulse generator placement;  Surgeon: Fairy Levels, MD;  Location: [facility];  Service: Neurosurgery;  Laterality: Bilateral;  Bilateral implantable pulse generator placement   skin cancer removed     from ears,   12 lft arm  rt leg 15   SUBTHALAMIC STIMULATOR BATTERY REPLACEMENT Bilateral 07/14/2017   Procedure: BILATERAL IMPLANTED PULSE GENERATOR CHANGE FOR DEEP BRAIN STIMULATOR;  Surgeon: Levels Fairy, MD;  Location: [facility];  Service: Neurosurgery;  Laterality: Bilateral;   SUBTHALAMIC STIMULATOR INSERTION Bilateral 12/06/2013   Procedure: SUBTHALAMIC STIMULATOR INSERTION;  Surgeon: Fairy Levels, MD;  Location: [facility];  Service: Neurosurgery;  Laterality: Bilateral;  Bilateral deep brain stimulator placement   Patient Active Problem List  Diagnosis Date Noted   Hematuria 10/10/2021   Sinus drainage 12/09/2020   Medicare annual wellness visit, subsequent 11/18/2020   Pacemaker    Dementia associated with Parkinson's disease (HCC) 06/17/2020   Vertigo 02/19/2020   Urine abnormality 02/19/2020   Dysphagia 02/23/2019   Fall at home 10/18/2017   REM behavioral disorder 11/02/2016   Radicular pain in right arm 10/09/2016   GERD (gastroesophageal reflux disease) 07/31/2016   Colon cancer screening 07/23/2015    Gout 02/18/2015   Depression 01/06/2015   Skin lesion 10/02/2014   Dupuytren's contracture 08/20/2014   Cough 07/21/2014   Lumbar stenosis with neurogenic claudication 06/13/2014   S/P deep brain stimulator placement 05/08/2014   Advance care planning 01/19/2014   Parkinson's disease (HCC) 12/13/2013   PTSD (post-traumatic stress disorder) 06/13/2013   Erectile dysfunction 06/13/2013   HLD (hyperlipidemia) 06/13/2013   Essential hypertension 06/03/2013   Bradycardia by electrocardiogram 06/03/2013   Obstructive sleep apnea 03/12/2013    ONSET DATE: September of 2025 (recent hospitalization).   REFERRING DIAG: Parkinson's disease without dyskinesia, with fluctuations   THERAPY DIAG:  Unsteadiness on feet  Difficulty in walking, not elsewhere classified  Muscle weakness (generalized)  Rationale for Evaluation and Treatment: Rehabilitation  SUBJECTIVE:                                                                                                                                                                                             SUBJECTIVE STATEMENT: Patient reports he uses an upright walker at home. Patient presents with wife, has had stumbles but no falls. Patient still using a Foley on L leg, goes tomorrow to find if he can remove it.   Pt accompanied by: Wife and caretaker.  PERTINENT HISTORY:  Parkinson's, deep brain stim, depression, lipids, HTN, GERD, ED, dementia, seizures/falls, LBP/PSF, hx of lumbar fusion in 04/2022, hx of hip fx 08/2022, arthritis, frequent falls,  and OSA   Patient arrived to PT evaluation with his wife and caretaker which assisted with subjective portion of evaluation. Pt reports having difficulty with balance/walking following a recent hospitalization. Patient had a UTI in the first week of September resulting in 2 week hospitalization. He then returned home for 2 days and returned back to hospital due to sepsis and was  in hospital an  additional 2 weeks. He reports he had kidney stone removed in first week of November as well.   Pt uses a stand up walker for gait. He reports having a fall last week and that he falls more often than he would like. Patient also reports having weakness in his lower extremities which could  be contributing to his falls.  Pt has been diagnosed with PD for about 15 years now.  Patient is having battery exchange on brain stimulator 04/23/2024.  Patient has foley catheter since September and is hoping to get it removed next Tuesday. -Patient's wife reports his mobility/independence  is dependent on the day as some days he is more independent than others. She reports days where he will just get up and walk without assistance (unsafely) and other times he has difficulty sitting up straight.   PAIN:  Are you having pain?  Yes, low back and L shoulder due to falls.  PRECAUTIONS: Fall  RED FLAGS: None   WEIGHT BEARING RESTRICTIONS: No  FALLS: Has patient fallen in last 6 months? Yes. Number of falls Unknown (frequent).   LIVING ENVIRONMENT: Lives with: lives with their spouse Lives in: House/apartment Stairs: External: 2-3 steps; bilateral but cannot reach both (due to current home renovations, this is the entrance he uses) primarily uses R HR  Has following equipment at home: Wheelchair (manual), shower chair, Grab bars, and Up-Walker.  PLOF: Needs assistance with ADLs, Needs assistance with gait, and Needs assistance with transfers  PATIENT GOALS: Improve LE strength/Improve balance.  OBJECTIVE:  Note: Objective measures were completed at Evaluation unless otherwise noted.  DIAGNOSTIC FINDINGS: CLINICAL DATA:  Fall.  Left shoulder pain.   EXAM: LEFT SHOULDER - 2+ VIEW   COMPARISON:  11/21/2019.   FINDINGS: There is a mildly displaced acute fracture of the superolateral aspect of the left clavicle with intra-articular extension.   No other acute fracture or dislocation.   No  aggressive osseous lesion.   Glenohumeral and acromioclavicular joints are normal in alignment and exhibit mild degenerative changes.   No soft tissue swelling.   No radiopaque foreign bodies.   IMPRESSION: Acute mildly displaced fracture of the lateral aspect of the left clavicle with intra-articular extension.     Electronically Signed   By: Ree Molt M.D.   On: 05/22/2023 15:27  COGNITION: Overall cognitive status: Cognitive impairments at baseline and pt with very delayed verbal responses to subjective questioning and noted to be easily distracted in the therapy gym environment    SENSATION: WFL  COORDINATION: Decreased coordination with toe tapping/heel to shin.     POSTURE: rounded shoulders, forward head.  LOWER EXTREMITY ROM:     Active  Right Eval Left Eval  Hip flexion    Hip extension    Hip abduction    Hip adduction    Hip internal rotation    Hip external rotation    Knee flexion    Knee extension    Ankle dorsiflexion    Ankle plantarflexion    Ankle inversion    Ankle eversion     (Blank rows = not tested)  LOWER EXTREMITY MMT:    MMT Right Eval Left Eval  Hip flexion 4 4  Hip extension    Hip abduction 4- 4  Hip adduction 5 5  Hip internal rotation    Hip external rotation    Knee flexion 4+ 4+  Knee extension 4+ 4+  Ankle dorsiflexion 5 5  Ankle plantarflexion 5 5  Ankle inversion    Ankle eversion    (Blank rows = not tested)  BED MOBILITY:  SBA TRANSFERS: SBA  RAMP:  Not tested  CURB:  Not tested  STAIRS: Not tested GAIT: Findings: Distance walked: 41' CGA/MIN. A. Using upright walker and Comments: Scissoring gait with decreased step length.   FUNCTIONAL TESTS:  5 times sit to stand: 15.45'' Timed up and go (TUG): 28.53 10 meter walk test: To be tested.  Berg Balance Scale: To be tested.                                                                                                                                 TREATMENT DATE: 04/08/2024 Physical Performance Measures 10 Meter Walk Test: Patient instructed to walk 10 meters (32.8 ft) as quickly and as safely as possible at their normal speed x2 and at a fast speed x2. Time measured from 2 meter mark to 8 meter mark to accommodate ramp-up and ramp-down.   Average Fast speed: 0.71 m/s with upright walker Cut off scores: <0.4 m/s = household Ambulator, 0.4-0.8 m/s = limited community Ambulator, >0.8 m/s = community Ambulator, >1.2 m/s = crossing a street, <1.0 = increased fall risk MCID 0.05 m/s (small), 0.13 m/s (moderate), 0.06 m/s (significant)  (ANPTA Core Set of Outcome Measures for Adults with Neurologic Conditions, 2018)  BERG:    [facility] PT Assessment - 04/08/24 0001       Berg Balance Test   Sit to Stand Able to stand using hands after several tries    Standing Unsupported Needs several tries to stand 30 seconds unsupported    Sitting with Back Unsupported but Feet Supported on Floor or Stool Able to sit 2 minutes under supervision    Stand to Sit Sits independently, has uncontrolled descent    Transfers Needs one person to assist    Standing Unsupported with Eyes Closed Unable to keep eyes closed 3 seconds but stays steady    Standing Unsupported with Feet Together Needs help to attain position and unable to hold for 15 seconds    From Standing, Reach Forward with Outstretched Arm Reaches forward but needs supervision    From Standing Position, Pick up Object from Floor Unable to pick up and needs supervision    From Standing Position, Turn to Look Behind Over each Shoulder Needs assist to keep from losing balance and falling    Turn 360 Degrees Needs assistance while turning    Standing Unsupported, Alternately Place Feet on Step/Stool Needs assistance to keep from falling or unable to try    Standing Unsupported, One Foot in [employer] balance while stepping or standing    Standing on One Leg Unable to try or needs assist to  prevent fall    Total Score 11         TA- To improve functional movements patterns for everyday tasks   Alternate fast and slow ambulation with upright walker, cues for larger step length and occasional assistance to remain upright due to posterior LOB and knee buckling. 2x160 ft, increased scissor gait with fatigue .  [facility] PT Assessment - 04/08/24 0001       Berg Balance Test   Sit to Stand Able to stand using hands after several tries  Standing Unsupported Needs several tries to stand 30 seconds unsupported    Sitting with Back Unsupported but Feet Supported on Floor or Stool Able to sit 2 minutes under supervision    Stand to Sit Sits independently, has uncontrolled descent    Transfers Needs one person to assist    Standing Unsupported with Eyes Closed Unable to keep eyes closed 3 seconds but stays steady    Standing Unsupported with Feet Together Needs help to attain position and unable to hold for 15 seconds    From Standing, Reach Forward with Outstretched Arm Reaches forward but needs supervision    From Standing Position, Pick up Object from Floor Unable to pick up and needs supervision    From Standing Position, Turn to Look Behind Over each Shoulder Needs assist to keep from losing balance and falling    Turn 360 Degrees Needs assistance while turning    Standing Unsupported, Alternately Place Feet on Step/Stool Needs assistance to keep from falling or unable to try    Standing Unsupported, One Foot in [employer] balance while stepping or standing    Standing on One Leg Unable to try or needs assist to prevent fall    Total Score 11         Sit to stand, grab a ball and toss into hoop for dual task and pertubation's x 25 balls ; x 2 sets, occasional max a required to remain upright due to posterior LOB.   GTB hamstring 20x ; careful of placement on L leg due to catheter bag.  Transfer to/from transport chair with mod A for safety.    PATIENT EDUCATION: Education  details:  PT POC, LTGs, findings on gait assessments. Person educated: Patient, Spouse, and Caregiver   Education method: Explanation Education comprehension: verbalized understanding and needs further education  HOME EXERCISE PROGRAM: To be provided at initial treatment sessions.   GOALS: Goals reviewed with patient? Yes  SHORT TERM GOALS: Target date: 04/18/2024  Patient will be independent with HEP in 2 weeks allowing him to perform his exercises at home with improved safety and proper mechanics.  Baseline: HEP to be provided upon initial visits.  Goal status: INITIAL   LONG TERM GOALS: Target date: 06/27/2024   1.  Patient will increase Berg Balance score by > 6 points to demonstrate decreased fall risk during functional activities. Baseline: To be tested 12/22: 11 Goal status: INITIAL   2.  Patient will reduce timed up and go to <11 seconds to reduce fall risk and demonstrate improved transfer/gait ability. Baseline: 28.53 with standing walker CGA. Goal status: INITIAL  3.  Patient will increase 10 meter walk test to >1.34m/s as to improve gait speed for better community ambulation and to reduce fall risk. Baseline: To be tested 12/22: 0.71 m/s with upright walker Goal status: INITIAL  4.  Patient will demonstrate ability to perform stairs safely with SBA by discharge allowing him to go into/out of home with decreased difficulty.  Baseline: To be assessed.  Goal status: INITIAL  5. Patient will demonstrate ability to ambulate short distances (50') using least restrictive device with SBA by discharge allowing him to ambulate inside home with reduced risk of falls. BASELINE: CGA/MIN. A.      ASSESSMENT:  CLINICAL IMPRESSION: Patient's 10 MWT and BERG performed with outcomes listed above. Patient has had significant loss of balance compared to previous plan of cares/rounds in physical therapy indicating need for focused stability training to decrease fall risk.  Frequent  posterior LOB noted throughout session. Patient may benefit from skilled PT to improve on deficits listed above for reduced risk of falls and increased independence with gait/functional mobility.    OBJECTIVE IMPAIRMENTS: Abnormal gait, decreased activity tolerance, decreased balance, decreased cognition, decreased coordination, decreased endurance, decreased knowledge of use of DME, decreased mobility, difficulty walking, decreased strength, decreased safety awareness, improper body mechanics, and postural dysfunction.   ACTIVITY LIMITATIONS: carrying, lifting, bending, sitting, standing, squatting, stairs, transfers, and bed mobility  PARTICIPATION LIMITATIONS: meal prep, cleaning, laundry, medication management, interpersonal relationship, driving, shopping, and community activity  PERSONAL FACTORS: Parkinson's, deep brain stim, depression, lipids, HTN, GERD, ED, dementia, seizures/falls, LBP/PSF, hx of lumbar fusion in 04/2022, hx of hip fx 08/2022, arthritis, frequent falls,  and OSA are also affecting patient's functional outcome.  are also affecting patient's functional outcome.   REHAB POTENTIAL: Good  CLINICAL DECISION MAKING: Evolving/moderate complexity  EVALUATION COMPLEXITY: Moderate  PLAN:  PT FREQUENCY: 1-2x/week  PT DURATION: 12 weeks  PLANNED INTERVENTIONS: 97164- PT Re-evaluation, 97750- Physical Performance Testing, 97110-Therapeutic exercises, 97530- Therapeutic activity, 97112- Neuromuscular re-education, 97535- Self Care, [id number]- Manual therapy, [phone]- Gait training, Patient/Family education, Balance training, Stair training, Vestibular training, Wheelchair mobility training, Cryotherapy, and Moist heat  PLAN FOR NEXT SESSION:  -Assess Stairs - -Improve safety with gait/transfers -Improve balance/LE strength with progressive exercises.   Trayven Lumadue  Leopoldo KLEIN, DPT Physical Therapist - [facility]  Outpatient Physical Therapy- Main  Campus [phone]     Areon Cocuzza, PT 04/08/2024, 2:03 PM        "

## 2024-04-09 NOTE — Therapy (Signed)
 " OUTPATIENT OCCUPATIONAL THERAPY NEURO TREATMENT NOTE  Patient Name: [name] MRN: [id number] DOB:1950-12-05, 73 y.o., male Today's Date: 04/09/2024  PCP: Toribio Messier REFERRING PROVIDER: Jacumin, Erika Leigh, PA  END OF SESSION:  OT End of Session - 04/09/24 2118     Visit Number 2    Number of Visits 8    Date for Recertification  05/16/24    Authorization Time Period Reporting period beginning 04/04/24    OT Start Time 1400    OT Stop Time 1445    OT Time Calculation (min) 45 min    Equipment Utilized During Treatment transport chair    Activity Tolerance Patient tolerated treatment well    Behavior During Therapy WFL for tasks assessed/performed         Past Medical History:  Diagnosis Date   Anxiety disorder    Arthritis    Bradycardia    Cancer (HCC) 2013   skin cancer   Depression    ptsd   Dysrhythmia    chronic slow heart rate   GERD (gastroesophageal reflux disease)    H/O tachycardia-bradycardia syndrome    Headache(784.0)    tension headaches non recent   History of chicken pox    History of kidney stones    passed   Hypertension    treated with HCTZ   Major depressive disorder    Mild aortic regurgitation    Pacemaker    Parkinson's disease (HCC)    dx'ed 15 years ago   PTSD (post-traumatic stress disorder)    S/P deep brain stimulator placement    Shortness of breath dyspnea    Sleep apnea    doesn't use C-pap   Unspecified dementia, moderate, with psychotic disturbance (HCC)    Varicose veins    Past Surgical History:  Procedure Laterality Date   CATARACT EXTRACTION W/PHACO Right 01/17/2023   Procedure: CATARACT EXTRACTION PHACO AND INTRAOCULAR LENS PLACEMENT (IOC) RIGHT 6.33 00:42.6;  Surgeon: Jaye Fallow, MD;  Location: [facility];  Service: Ophthalmology;  Laterality: Right;   CATARACT EXTRACTION W/PHACO Left 02/07/2023   Procedure: CATARACT EXTRACTION PHACO AND INTRAOCULAR LENS PLACEMENT (IOC) LEFT 6.35 00:38.3;   Surgeon: Jaye Fallow, MD;  Location: [facility];  Service: Ophthalmology;  Laterality: Left;   CHOLECYSTECTOMY N/A 10/22/2014   Procedure: LAPAROSCOPIC CHOLECYSTECTOMY WITH INTRAOPERATIVE CHOLANGIOGRAM;  Surgeon: Elgin Laurence III, MD;  Location: [facility];  Service: General;  Laterality: N/A;   cyst removed      from lip as a child   INTRAMEDULLARY (IM) NAIL INTERTROCHANTERIC N/A 02/18/2019   Procedure: INTRAMEDULLARY (IM) NAIL INTERTROCHANTRIC, RIGHT,;  Surgeon: Marchia Drivers, MD;  Location: [facility];  Service: Orthopedics;  Laterality: N/A;   LUMBAR LAMINECTOMY/DECOMPRESSION MICRODISCECTOMY Bilateral 12/14/2012   Procedure: Bilateral lumbar three-four, four-five decompressive laminotomy/foraminotomy;  Surgeon: Victory DELENA Gunnels, MD;  Location: [facility];  Service: Neurosurgery;  Laterality: Bilateral;   PULSE GENERATOR IMPLANT Bilateral 12/13/2013   Procedure: Bilateral implantable pulse generator placement;  Surgeon: Fairy Levels, MD;  Location: [facility];  Service: Neurosurgery;  Laterality: Bilateral;  Bilateral implantable pulse generator placement   skin cancer removed     from ears,   12 lft arm  rt leg 15   SUBTHALAMIC STIMULATOR BATTERY REPLACEMENT Bilateral 07/14/2017   Procedure: BILATERAL IMPLANTED PULSE GENERATOR CHANGE FOR DEEP BRAIN STIMULATOR;  Surgeon: Levels Fairy, MD;  Location: [facility];  Service: Neurosurgery;  Laterality: Bilateral;   SUBTHALAMIC STIMULATOR INSERTION Bilateral 12/06/2013   Procedure: SUBTHALAMIC STIMULATOR INSERTION;  Surgeon: Fairy Levels, MD;  Location: [facility];  Service: Neurosurgery;  Laterality: Bilateral;  Bilateral deep brain stimulator placement   Patient Active Problem List   Diagnosis Date Noted   Hematuria 10/10/2021   Sinus drainage 12/09/2020   Medicare annual wellness visit, subsequent 11/18/2020   Pacemaker    Dementia associated with Parkinson's disease (HCC) 06/17/2020   Vertigo 02/19/2020   Urine abnormality 02/19/2020    Dysphagia 02/23/2019   Fall at home 10/18/2017   REM behavioral disorder 11/02/2016   Radicular pain in right arm 10/09/2016   GERD (gastroesophageal reflux disease) 07/31/2016   Colon cancer screening 07/23/2015   Gout 02/18/2015   Depression 01/06/2015   Skin lesion 10/02/2014   Dupuytren's contracture 08/20/2014   Cough 07/21/2014   Lumbar stenosis with neurogenic claudication 06/13/2014   S/P deep brain stimulator placement 05/08/2014   Advance care planning 01/19/2014   Parkinson's disease (HCC) 12/13/2013   PTSD (post-traumatic stress disorder) 06/13/2013   Erectile dysfunction 06/13/2013   HLD (hyperlipidemia) 06/13/2013   Essential hypertension 06/03/2013   Bradycardia by electrocardiogram 06/03/2013   Obstructive sleep apnea 03/12/2013   ONSET DATE: 2015 with worsening symptoms in Sept of this year after 2 hospitalizations r/t UTI/sepsis  REFERRING DIAG: Parkinson's Disease  THERAPY DIAG:  Muscle weakness (generalized)  Other lack of coordination  Parkinson's disease without dyskinesia or fluctuating manifestations (HCC)  Rationale for Evaluation and Treatment: Rehabilitation  SUBJECTIVE:   SUBJECTIVE STATEMENT: Spouse reports pt has had no falls since last therapy session.  Spouse indicates difficulty using bathroom at home and ensuring pt stays seated from the other room.  Reviewed strategies below. Pt accompanied by: self, significant other, and caregiver [name]), spouse Mcgee)  PERTINENT HISTORY: Parkinson's Disease, dementia associated with PD, DBS, multiple/frequent falls, 2 recent hospitalizations in 12/2023 for chronic UTI (2 weeks in the hospital for UTI, came home 2 days and re-admitted for another 2 weeks d/t sepsis).  Pt still has foley catheter in place.    PRECAUTIONS: Fall  WEIGHT BEARING RESTRICTIONS: No  PAIN: 04/08/24: No pain reported this date.  Are you having pain? Yes: NPRS scale: 6/10 FACES scale, pt points to rating on visual chart    Pain location: L shoulder and back (pt unable to specify location of pain in back) Pain description: unable to describe  Aggravating factors: flared up d/t multiple falls Relieving factors: rest, prn pain medication   FALLS: Has patient fallen in last 6 months? Caregiver estimates 2 in the last month, multiple over the last 6 months/uncertain   LIVING ENVIRONMENT: Caregiver Rosaline present 40 hours a week, alternating between 5 and 7 days a week Lives with: lives with their spouse Lives in: 1 level home Stairs: Yes: External: 2 steps; can reach both, ramp access in the back  Has following equipment at home: Wheelchair (manual), shower chair, Grab bars, Ramped entry, and stand up walker  PLOF: Needs assistance with ADLs  PATIENT GOALS: Trying to get his hands, fork and spoon, shoes and socks by himself,   OBJECTIVE:  Note: Objective measures were completed at Evaluation unless otherwise noted.  HAND DOMINANCE: Right  ADLs: Overall ADLs: caregiver assist for all ADLs Transfers/ambulation related to ADLs: depending on the day, can fluctuate between CGA-mod A  Eating: caregiver estimates assist to cut food about 20% of the time, frequent dropping of standard utensils, had been using plastic silverware for light weight but returned to standard and caregiver reports pt declines to utilize built up utensils to  reduce dropping  Grooming: set up to comb hair, set up for brushing teeth; dep for shaving  UB Dressing: mod A for donning a tshirt; assist needed for buttoning  LB Dressing: max A; pt can lift feet into pants while seated; inconsistent to don shoes, able to don 1 sock today, per caregiver  Toileting: currently has a catheter; pt follows up on Tues with MD for catheter; very occasionally has accidents with bowels; mod A for peri hygiene  Bathing: Max A; caregiver reports that pt tries, but caregiver completes for thoroughness.   Tub Shower transfers: CGA with grab bars  Equipment:  Shower seat with back, Grab bars, Walk in shower, and elevated toilet seat with toilet safety rails   IADLs: total assist from caregivers d/t limited mobility and cognitive impairments  MOBILITY STATUS: Hx of falls; transport chair used today to reach therapy gym; pt uses stand up walker in the home with close supv-CGA  POSTURE COMMENTS:  rounded shoulders  ACTIVITY TOLERANCE: Activity tolerance: TBD further assessed in follow up sessions.  Pt intermittently had eyes closed during eval while caregiver assisted to answer questions. Visibly tired but appropriately aroused when prompted for participation in objective measures.   UPPER EXTREMITY ROM:  BUE ROM WFL, including L shoulder  UPPER EXTREMITY MMT:     MMT Right eval Left eval  Shoulder flexion 5 Limited to 3+ d/t pain  Shoulder abduction 5 NT d/t pain  Shoulder adduction    Shoulder extension    Shoulder internal rotation    Shoulder external rotation    Middle trapezius    Lower trapezius    Elbow flexion 5 5  Elbow extension 5 5  Wrist flexion 5 5  Wrist extension 5 5  Wrist ulnar deviation    Wrist radial deviation    Wrist pronation    Wrist supination    (Blank rows = not tested)  HAND FUNCTION: Grip strength: Right: 63 lbs; Left: 63 lbs, Lateral pinch: Right: 18 lbs, Left: 20 lbs, and 3 point pinch: Right: 13 lbs, Left: 14 lbs  COORDINATION: 9 Hole Peg test: Right: 49 sec; Left: 43 sec (increased time d/t difficulty following commands)   SENSATION: Not tested d/t cognitive impairment   EDEMA: No visible edema in BUEs  COGNITION: Overall cognitive status: Impaired; hx of dementia r/t PD.  Pt requires increased time for processing visual and verbal instructions; inconsistent to follow 1 step commands, often requiring visual demo or tactile cues.  Caregiver needed to answer questions r/t ADL performance d/t pt unable.   VISION: Wears glasses  PERCEPTION: Not tested  PRAXIS: Impaired: Motor planning and  Perseveration                                                                                                                            TREATMENT DATE: 04/09/24  Therapeutic Activity: -Participation in partial assembly and disassembly of model helicopter.  Activity chosen today for OT to observe  pt's ability to engage and participate in a variety of therapeutic tasks for cognitive engagement and [facility], including simple tool use, sorting pieces, stabilizing tools or parts, and following 1 step directions.  OT provided step by step direct supv for above, and prompted pt to alternate use of R/L hands when indicated to target R/L hand coordination.    Self Care: -OT reviewed pt's participation with spouse (spouse present near end of activity noted above) and educated on achievable goals for participation in similar tasks at home allowing both cognitive and physical engagement at pt's current functional level.  OT provided specific activity components for which pt can participate, including gathering and sorting game pieces/puzzle pieces/parts of model figures such as above, though recommended larger pieces for easier manipulation.   -Recommendation for use of card holder for playing card games with caregiver assist for sequencing and problem solving through a game. -Review of fall prevention strategies: use of chair/bed alarms, bell on walker to cue caregivers of pt preparing to transfer, positioning furniture to temporarily impede a STS attempt if spouse is using the restroom (side table in front of pt).   -Reviewed strategies for spouse to use restroom at [employer] without fear of pt falling: direct supv from another caregiver or friend needed.  Reviewed redirection strategies when pt is focused on STS.    PATIENT EDUCATION: Education details: Fall prevention, grading leisure activities Person educated: Patient and Passenger Transport Manager method: Leisure Centre Manager cues Education comprehension:  verbalized understanding  HOME EXERCISE PROGRAM: To be initiated in upcoming sessions  GOALS: Goals reviewed with patient? Yes  SHORT TERM GOALS: Target date: 04/25/24  Caregivers will verbalize and demo indep with HEP, helping to maintain/slow decline of pt's BUE strength and coordination for optimal engagement in daily tasks.  Baseline: Eval: Not yet initiated or reviewed Goal status: INITIAL  2.  Caregivers will be indep to verbalize and implement 2-3 fall prevention strategies to reduce fall risk during ADLs and functional mobility.   Baseline: Eval: Educ not yet initiated  Goal status: INITIAL   LONG TERM GOALS: Target date: 05/16/24  Caregivers will be indep to verbalize and implement 1-2 strategies to maximize pt's participation and safety in UB ADLs.  Baseline: Eval: Educ not yet initiated Goal status: INITIAL  2.  Caregivers will be indep to verbalize and implement 1-2 strategies to maximize pt's participation and safety in LB ADLs.  Baseline: Eval: Educ not yet initiated  Goal status: INITIAL  3.  Caregivers will identify 1-2 leisure activities for providing physical and cognitive engagement into pt's daily routine, while verbalizing or demonstrating strategies to modify activities to meet pt's current physical and cognitive level, while identifying strategies to downgrade activities as needed in order to manage expected ongoing decline related to pt's chronic/progressive/degenerative neurological disease process.    Baseline: Eval: Educ not yet initiated  Goal status: INITIAL  ASSESSMENT:  CLINICAL IMPRESSION:  Able to identify components of leisure activities to actively engage pt, including participation in sorting, assembly, simple tool use with manipulation of medium size parts (ie washers/nuts/bolts), puzzle pieces also recommended.  Reinforced expectation for pt to require direct supv, sequencing assist, and no more than 1 step commands when participating in  activities in order to maximize pt's engagement and enjoyment.  Spouse verbalized understanding of fall prevention strategies, but further review needed.  Spouse receptive to trial of bell on walker.  Spouse acknowledged understanding that pt's STM will no longer consistently allow him to remember to  stay seated when spouse is in the restroom.  External cues and barriers are needed, and potentially another caregiver present if external cues and barriers are not consistently effective.  Pt will continue to benefit from skilled OT within a shortened episode to reinforce caregiver education on fall prevention, strategies to maximize pt's safety and participation in daily tasks, provide education on strategies to maintain pt's physical and cognitive engagement in leisure activities to improve QOL, and to further provide disease management education related to expected future declines with ADLs and functional mobility.      PERFORMANCE DEFICITS: in functional skills including ADLs, IADLs, coordination, dexterity, ROM, strength, pain, flexibility, Fine motor control, Gross motor control, mobility, balance, body mechanics, decreased knowledge of precautions, decreased knowledge of use of DME, vision, and UE functional use, cognitive skills including attention, emotional, energy/drive, learn, memory, orientation, problem solving, safety awareness, sequencing, temperament/personality, thought, and understand, and psychosocial skills including coping strategies, environmental adaptation, habits, interpersonal interactions, and routines and behaviors.   IMPAIRMENTS: are limiting patient from ADLs, IADLs, leisure, and social participation.   CO-MORBIDITIES: has co-morbidities such as dementia, anxiety, repeated falls, HTN, arthritis that affects occupational performance. Patient will benefit from skilled OT to address above impairments and improve overall function.  MODIFICATION OR ASSISTANCE TO COMPLETE EVALUATION:  Min-Moderate modification of tasks or assist with assess necessary to complete an evaluation.  OT OCCUPATIONAL PROFILE AND HISTORY: Detailed assessment: Review of records and additional review of physical, cognitive, psychosocial history related to current functional performance.  CLINICAL DECISION MAKING: Moderate - several treatment options, min-mod task modification necessary  REHAB POTENTIAL: Good for goals established  EVALUATION COMPLEXITY: Moderate    PLAN:  OT FREQUENCY: 1-2x/week  OT DURATION: 6 weeks (anticipate goal achievement in 8 sessions or less; with frequency set for 6 weeks d/t upcoming holidays)  PLANNED INTERVENTIONS: 02831 OT Re-evaluation, 97535 self care/ADL training, 02889 therapeutic exercise, 97530 therapeutic activity, 97112 neuromuscular re-education, 97140 manual therapy, 97116 gait training, 02989 moist heat, 97010 cryotherapy, 97750 Physical Performance Testing, passive range of motion, balance training, functional mobility training, psychosocial skills training, energy conservation, coping strategies training, patient/family education, and DME and/or AE instructions  RECOMMENDED OTHER SERVICES: none at this time; multi-discipline with OT/PT   CONSULTED AND AGREED WITH PLAN OF CARE: Patient and family member/caregiver   PLAN FOR NEXT SESSION: see above goals  Inocente Blazing, MS, OTR/L   Inocente MARLA Blazing, OT 04/09/2024, 9:19 PM      "

## 2024-04-15 ENCOUNTER — Ambulatory Visit

## 2024-04-15 ENCOUNTER — Ambulatory Visit: Admitting: Physical Therapy

## 2024-04-15 DIAGNOSIS — R269 Unspecified abnormalities of gait and mobility: Secondary | ICD-10-CM

## 2024-04-15 DIAGNOSIS — G20A1 Parkinson's disease without dyskinesia, without mention of fluctuations: Secondary | ICD-10-CM

## 2024-04-15 DIAGNOSIS — R296 Repeated falls: Secondary | ICD-10-CM

## 2024-04-15 DIAGNOSIS — R278 Other lack of coordination: Secondary | ICD-10-CM

## 2024-04-15 DIAGNOSIS — R262 Difficulty in walking, not elsewhere classified: Secondary | ICD-10-CM

## 2024-04-15 DIAGNOSIS — R2689 Other abnormalities of gait and mobility: Secondary | ICD-10-CM

## 2024-04-15 DIAGNOSIS — M6281 Muscle weakness (generalized): Secondary | ICD-10-CM

## 2024-04-15 DIAGNOSIS — R2681 Unsteadiness on feet: Secondary | ICD-10-CM

## 2024-04-15 NOTE — Therapy (Unsigned)
 " OUTPATIENT OCCUPATIONAL THERAPY NEURO TREATMENT NOTE  Patient Name: [name] MRN: [id number] DOB:Jun 07, 1950, 73 y.o., male Today's Date: 04/16/2024  PCP: Toribio Messier REFERRING PROVIDER: Jacumin, Erika Leigh, PA  END OF SESSION:  OT End of Session - 04/16/24 0809     Visit Number 3    Number of Visits 8    Date for Recertification  05/16/24    Authorization Time Period Reporting period beginning 04/04/24    OT Start Time 1445    OT Stop Time 1530    OT Time Calculation (min) 45 min    Equipment Utilized During Treatment transport chair    Activity Tolerance Patient tolerated treatment well    Behavior During Therapy WFL for tasks assessed/performed         Past Medical History:  Diagnosis Date   Anxiety disorder    Arthritis    Bradycardia    Cancer (HCC) 2013   skin cancer   Depression    ptsd   Dysrhythmia    chronic slow heart rate   GERD (gastroesophageal reflux disease)    H/O tachycardia-bradycardia syndrome    Headache(784.0)    tension headaches non recent   History of chicken pox    History of kidney stones    passed   Hypertension    treated with HCTZ   Major depressive disorder    Mild aortic regurgitation    Pacemaker    Parkinson's disease (HCC)    dx'ed 15 years ago   PTSD (post-traumatic stress disorder)    S/P deep brain stimulator placement    Shortness of breath dyspnea    Sleep apnea    doesn't use C-pap   Unspecified dementia, moderate, with psychotic disturbance (HCC)    Varicose veins    Past Surgical History:  Procedure Laterality Date   CATARACT EXTRACTION W/PHACO Right 01/17/2023   Procedure: CATARACT EXTRACTION PHACO AND INTRAOCULAR LENS PLACEMENT (IOC) RIGHT 6.33 00:42.6;  Surgeon: Jaye Fallow, MD;  Location: [facility];  Service: Ophthalmology;  Laterality: Right;   CATARACT EXTRACTION W/PHACO Left 02/07/2023   Procedure: CATARACT EXTRACTION PHACO AND INTRAOCULAR LENS PLACEMENT (IOC) LEFT 6.35 00:38.3;   Surgeon: Jaye Fallow, MD;  Location: [facility];  Service: Ophthalmology;  Laterality: Left;   CHOLECYSTECTOMY N/A 10/22/2014   Procedure: LAPAROSCOPIC CHOLECYSTECTOMY WITH INTRAOPERATIVE CHOLANGIOGRAM;  Surgeon: Elgin Laurence III, MD;  Location: [facility];  Service: General;  Laterality: N/A;   cyst removed      from lip as a child   INTRAMEDULLARY (IM) NAIL INTERTROCHANTERIC N/A 02/18/2019   Procedure: INTRAMEDULLARY (IM) NAIL INTERTROCHANTRIC, RIGHT,;  Surgeon: Marchia Drivers, MD;  Location: [facility];  Service: Orthopedics;  Laterality: N/A;   LUMBAR LAMINECTOMY/DECOMPRESSION MICRODISCECTOMY Bilateral 12/14/2012   Procedure: Bilateral lumbar three-four, four-five decompressive laminotomy/foraminotomy;  Surgeon: Victory DELENA Gunnels, MD;  Location: [facility];  Service: Neurosurgery;  Laterality: Bilateral;   PULSE GENERATOR IMPLANT Bilateral 12/13/2013   Procedure: Bilateral implantable pulse generator placement;  Surgeon: Fairy Levels, MD;  Location: [facility];  Service: Neurosurgery;  Laterality: Bilateral;  Bilateral implantable pulse generator placement   skin cancer removed     from ears,   12 lft arm  rt leg 15   SUBTHALAMIC STIMULATOR BATTERY REPLACEMENT Bilateral 07/14/2017   Procedure: BILATERAL IMPLANTED PULSE GENERATOR CHANGE FOR DEEP BRAIN STIMULATOR;  Surgeon: Levels Fairy, MD;  Location: [facility];  Service: Neurosurgery;  Laterality: Bilateral;   SUBTHALAMIC STIMULATOR INSERTION Bilateral 12/06/2013   Procedure: SUBTHALAMIC STIMULATOR INSERTION;  Surgeon: Fairy Levels, MD;  Location: [facility];  Service: Neurosurgery;  Laterality: Bilateral;  Bilateral deep brain stimulator placement   Patient Active Problem List   Diagnosis Date Noted   Hematuria 10/10/2021   Sinus drainage 12/09/2020   Medicare annual wellness visit, subsequent 11/18/2020   Pacemaker    Dementia associated with Parkinson's disease (HCC) 06/17/2020   Vertigo 02/19/2020   Urine abnormality 02/19/2020    Dysphagia 02/23/2019   Fall at home 10/18/2017   REM behavioral disorder 11/02/2016   Radicular pain in right arm 10/09/2016   GERD (gastroesophageal reflux disease) 07/31/2016   Colon cancer screening 07/23/2015   Gout 02/18/2015   Depression 01/06/2015   Skin lesion 10/02/2014   Dupuytren's contracture 08/20/2014   Cough 07/21/2014   Lumbar stenosis with neurogenic claudication 06/13/2014   S/P deep brain stimulator placement 05/08/2014   Advance care planning 01/19/2014   Parkinson's disease (HCC) 12/13/2013   PTSD (post-traumatic stress disorder) 06/13/2013   Erectile dysfunction 06/13/2013   HLD (hyperlipidemia) 06/13/2013   Essential hypertension 06/03/2013   Bradycardia by electrocardiogram 06/03/2013   Obstructive sleep apnea 03/12/2013   ONSET DATE: 2015 with worsening symptoms in Sept of this year after 2 hospitalizations r/t UTI/sepsis  REFERRING DIAG: Parkinson's Disease  THERAPY DIAG:  Muscle weakness (generalized)  Other lack of coordination  Parkinson's disease without dyskinesia or fluctuating manifestations (HCC)  Rationale for Evaluation and Treatment: Rehabilitation  SUBJECTIVE:  SUBJECTIVE STATEMENT: Spouse reports they tried putting bells on pt's walker as an external cue for fall prevention, but pt removed them.  Pt accompanied by: self, significant other, and caregiver George), spouse Mcgee)  PERTINENT HISTORY: Parkinson's Disease, dementia associated with PD, DBS, multiple/frequent falls, 2 recent hospitalizations in 12/2023 for chronic UTI (2 weeks in the hospital for UTI, came home 2 days and re-admitted for another 2 weeks d/t sepsis).  Pt still has foley catheter in place.    PRECAUTIONS: Fall  WEIGHT BEARING RESTRICTIONS: No  PAIN: 04/15/24: No pain reported this date.  Are you having pain? Yes: NPRS scale: 6/10 FACES scale, pt points to rating on visual chart   Pain location: L shoulder and back (pt unable to specify location of pain in  back) Pain description: unable to describe  Aggravating factors: flared up d/t multiple falls Relieving factors: rest, prn pain medication   FALLS: Has patient fallen in last 6 months? Caregiver estimates 2 in the last month, multiple over the last 6 months/uncertain   LIVING ENVIRONMENT: Caregiver Rosaline present 40 hours a week, alternating between 5 and 7 days a week Lives with: lives with their spouse Lives in: 1 level home Stairs: Yes: External: 2 steps; can reach both, ramp access in the back  Has following equipment at home: Wheelchair (manual), shower chair, Grab bars, Ramped entry, and stand up walker  PLOF: Needs assistance with ADLs  PATIENT GOALS: Trying to get his hands, fork and spoon, shoes and socks by himself,   OBJECTIVE:  Note: Objective measures were completed at Evaluation unless otherwise noted.  HAND DOMINANCE: Right  ADLs: Overall ADLs: caregiver assist for all ADLs Transfers/ambulation related to ADLs: depending on the day, can fluctuate between CGA-mod A  Eating: caregiver estimates assist to cut food about 20% of the time, frequent dropping of standard utensils, had been using plastic silverware for light weight but returned to standard and caregiver reports pt declines to utilize built up utensils to reduce dropping  Grooming: set up to comb hair, set up for  brushing teeth; dep for shaving  UB Dressing: mod A for donning a tshirt; assist needed for buttoning  LB Dressing: max A; pt can lift feet into pants while seated; inconsistent to don shoes, able to don 1 sock today, per caregiver  Toileting: currently has a catheter; pt follows up on Tues with MD for catheter; very occasionally has accidents with bowels; mod A for peri hygiene  Bathing: Max A; caregiver reports that pt tries, but caregiver completes for thoroughness.   Tub Shower transfers: CGA with grab bars  Equipment: Shower seat with back, Grab bars, Walk in shower, and elevated toilet seat with  toilet safety rails   IADLs: total assist from caregivers d/t limited mobility and cognitive impairments  MOBILITY STATUS: Hx of falls; transport chair used today to reach therapy gym; pt uses stand up walker in the home with close supv-CGA  POSTURE COMMENTS:  rounded shoulders  ACTIVITY TOLERANCE: Activity tolerance: TBD further assessed in follow up sessions.  Pt intermittently had eyes closed during eval while caregiver assisted to answer questions. Visibly tired but appropriately aroused when prompted for participation in objective measures.   UPPER EXTREMITY ROM:  BUE ROM WFL, including L shoulder  UPPER EXTREMITY MMT:     MMT Right eval Left eval  Shoulder flexion 5 Limited to 3+ d/t pain  Shoulder abduction 5 NT d/t pain  Shoulder adduction    Shoulder extension    Shoulder internal rotation    Shoulder external rotation    Middle trapezius    Lower trapezius    Elbow flexion 5 5  Elbow extension 5 5  Wrist flexion 5 5  Wrist extension 5 5  Wrist ulnar deviation    Wrist radial deviation    Wrist pronation    Wrist supination    (Blank rows = not tested)  HAND FUNCTION: Grip strength: Right: 63 lbs; Left: 63 lbs, Lateral pinch: Right: 18 lbs, Left: 20 lbs, and 3 point pinch: Right: 13 lbs, Left: 14 lbs  COORDINATION: 9 Hole Peg test: Right: 49 sec; Left: 43 sec (increased time d/t difficulty following commands)   SENSATION: Not tested d/t cognitive impairment   EDEMA: No visible edema in BUEs  COGNITION: Overall cognitive status: Impaired; hx of dementia r/t PD.  Pt requires increased time for processing visual and verbal instructions; inconsistent to follow 1 step commands, often requiring visual demo or tactile cues.  Caregiver needed to answer questions r/t ADL performance d/t pt unable.   VISION: Wears glasses  PERCEPTION: Not tested  PRAXIS: Impaired: Motor planning and Perseveration                                                                                                                             TREATMENT DATE: 04/15/24  Self Care: -Review of OT goals with private caregiver -Review of fall prevention measures; reinforced direct supv needed if external cues (ie: bells/chair alarms) are not consistently successful \ -Encouraged visiting  friends and family members be educated on need for pt's direct supv to promote group effort for reducing pt's fall risk when in the home.  -Reviewed strategies with private caregiver to incorporate BUE ROM/strength/coordination into daily tasks (bathing/dressing/etc) -Assisted pt/spouse/private caregiver to identify various leisure tasks and breaking down components of tasks to engage pt physically and cognitively at levels appropriate for pt  Leisure activities identified today with OT/pt/spouse/private caregiver: -assembling/disassembling broken or unused objects in the home/garage or model toy kits -organizing drawers -sorting/folding laundry -fishing pole set up -games that encourage FMC/GMC and cognitive engagement: Connect Four, checkers, cards, Memory, and alternating arms to manipulate game pieces  -buffing/shining shoes  -Sanding furniture or smaller pieces of wood with large sandpaper block   PATIENT EDUCATION: Education details: Fall prevention, grading leisure activities Person educated: Patient and Passenger Transport Manager method: Leisure Centre Manager cues Education comprehension: verbalized understanding  HOME EXERCISE PROGRAM: To be initiated in upcoming sessions  GOALS: Goals reviewed with patient? Yes  SHORT TERM GOALS: Target date: 04/25/24  Caregivers will verbalize and demo indep with HEP, helping to maintain/slow decline of pt's BUE strength and coordination for optimal engagement in daily tasks.  Baseline: Eval: Not yet initiated or reviewed Goal status: INITIAL  2.  Caregivers will be indep to verbalize and implement 2-3 fall prevention strategies to reduce fall  risk during ADLs and functional mobility.   Baseline: Eval: Educ not yet initiated  Goal status: INITIAL   LONG TERM GOALS: Target date: 05/16/24  Caregivers will be indep to verbalize and implement 1-2 strategies to maximize pt's participation and safety in UB ADLs.  Baseline: Eval: Educ not yet initiated Goal status: INITIAL  2.  Caregivers will be indep to verbalize and implement 1-2 strategies to maximize pt's participation and safety in LB ADLs.  Baseline: Eval: Educ not yet initiated  Goal status: INITIAL  3.  Caregivers will identify 1-2 leisure activities for providing physical and cognitive engagement into pt's daily routine, while verbalizing or demonstrating strategies to modify activities to meet pt's current physical and cognitive level, while identifying strategies to downgrade activities as needed in order to manage expected ongoing decline related to pt's chronic/progressive/degenerative neurological disease process.    Baseline: Eval: Educ not yet initiated  Goal status: INITIAL  ASSESSMENT: CLINICAL IMPRESSION:  Spouse confirmed external cues for reducing fall prevention have not been consistently successful, but verbalized understanding of need for direct supv for pt to consistently reduce fall risk.  Spouse reports that she is in the process of seeking additional caregiver hours to maximize direct supv.  Spouse and private caregiver both receptive to education provided today for fall prevention and strategies to grade leisure activities to maximize pt's physical and cognitive engagement into activities throughout the day.  Spouse plans to bring in a new kit that she purchased next session that includes model truck assembly for OT to assess components of this task which may be appropriate for pt to work with in the home.  Pt will continue to benefit from skilled OT to reinforce caregiver education on fall prevention, strategies to maximize pt's safety and participation in daily  tasks, provide education on strategies to maintain pt's physical and cognitive engagement in leisure activities to improve QOL, and to further provide disease management education related to expected future declines with ADLs and functional mobility.      PERFORMANCE DEFICITS: in functional skills including ADLs, IADLs, coordination, dexterity, ROM, strength, pain, flexibility, Fine motor control, Gross motor control,  mobility, balance, body mechanics, decreased knowledge of precautions, decreased knowledge of use of DME, vision, and UE functional use, cognitive skills including attention, emotional, energy/drive, learn, memory, orientation, problem solving, safety awareness, sequencing, temperament/personality, thought, and understand, and psychosocial skills including coping strategies, environmental adaptation, habits, interpersonal interactions, and routines and behaviors.   IMPAIRMENTS: are limiting patient from ADLs, IADLs, leisure, and social participation.   CO-MORBIDITIES: has co-morbidities such as dementia, anxiety, repeated falls, HTN, arthritis that affects occupational performance. Patient will benefit from skilled OT to address above impairments and improve overall function.  MODIFICATION OR ASSISTANCE TO COMPLETE EVALUATION: Min-Moderate modification of tasks or assist with assess necessary to complete an evaluation.  OT OCCUPATIONAL PROFILE AND HISTORY: Detailed assessment: Review of records and additional review of physical, cognitive, psychosocial history related to current functional performance.  CLINICAL DECISION MAKING: Moderate - several treatment options, min-mod task modification necessary  REHAB POTENTIAL: Good for goals established  EVALUATION COMPLEXITY: Moderate    PLAN:  OT FREQUENCY: 1-2x/week  OT DURATION: 6 weeks (anticipate goal achievement in 8 sessions or less; with frequency set for 6 weeks d/t upcoming holidays)  PLANNED INTERVENTIONS: 02831 OT  Re-evaluation, 97535 self care/ADL training, 02889 therapeutic exercise, 97530 therapeutic activity, 97112 neuromuscular re-education, 97140 manual therapy, 97116 gait training, 02989 moist heat, 97010 cryotherapy, 97750 Physical Performance Testing, passive range of motion, balance training, functional mobility training, psychosocial skills training, energy conservation, coping strategies training, patient/family education, and DME and/or AE instructions  RECOMMENDED OTHER SERVICES: none at this time; multi-discipline with OT/PT   CONSULTED AND AGREED WITH PLAN OF CARE: Patient and family member/caregiver   PLAN FOR NEXT SESSION: see above goals  Inocente Blazing, MS, OTR/L   Inocente MARLA Blazing, OT 04/16/2024, 8:15 AM      "

## 2024-04-15 NOTE — Therapy (Unsigned)
 " OUTPATIENT PHYSICAL THERAPY NEURO TREATMENT   Patient Name: [name] MRN: [id number] DOB:11-Jan-1951, 73 y.o., male Today's Date: 04/15/2024   PCP: Sid Sieving, PA-C  REFERRING PROVIDER: Jacumin, Erika Leigh, PA   END OF SESSION:  PT End of Session - 04/15/24 1533     Visit Number 3    Number of Visits 24    Date for Recertification  06/27/24    Progress Note Due on Visit 10    PT Start Time 1533    PT Stop Time 1615    PT Time Calculation (min) 42 min    Equipment Utilized During Treatment Gait belt    Activity Tolerance Patient tolerated treatment well;No increased pain    Behavior During Therapy WFL for tasks assessed/performed           Past Medical History:  Diagnosis Date   Anxiety disorder    Arthritis    Bradycardia    Cancer (HCC) 2013   skin cancer   Depression    ptsd   Dysrhythmia    chronic slow heart rate   GERD (gastroesophageal reflux disease)    H/O tachycardia-bradycardia syndrome    Headache(784.0)    tension headaches non recent   History of chicken pox    History of kidney stones    passed   Hypertension    treated with HCTZ   Major depressive disorder    Mild aortic regurgitation    Pacemaker    Parkinson's disease (HCC)    dx'ed 15 years ago   PTSD (post-traumatic stress disorder)    S/P deep brain stimulator placement    Shortness of breath dyspnea    Sleep apnea    doesn't use C-pap   Unspecified dementia, moderate, with psychotic disturbance (HCC)    Varicose veins    Past Surgical History:  Procedure Laterality Date   CATARACT EXTRACTION W/PHACO Right 01/17/2023   Procedure: CATARACT EXTRACTION PHACO AND INTRAOCULAR LENS PLACEMENT (IOC) RIGHT 6.33 00:42.6;  Surgeon: Jaye Fallow, MD;  Location: [facility];  Service: Ophthalmology;  Laterality: Right;   CATARACT EXTRACTION W/PHACO Left 02/07/2023   Procedure: CATARACT EXTRACTION PHACO AND INTRAOCULAR LENS PLACEMENT (IOC) LEFT 6.35 00:38.3;  Surgeon:  Jaye Fallow, MD;  Location: [facility];  Service: Ophthalmology;  Laterality: Left;   CHOLECYSTECTOMY N/A 10/22/2014   Procedure: LAPAROSCOPIC CHOLECYSTECTOMY WITH INTRAOPERATIVE CHOLANGIOGRAM;  Surgeon: Elgin Laurence III, MD;  Location: [facility];  Service: General;  Laterality: N/A;   cyst removed      from lip as a child   INTRAMEDULLARY (IM) NAIL INTERTROCHANTERIC N/A 02/18/2019   Procedure: INTRAMEDULLARY (IM) NAIL INTERTROCHANTRIC, RIGHT,;  Surgeon: Marchia Drivers, MD;  Location: [facility];  Service: Orthopedics;  Laterality: N/A;   LUMBAR LAMINECTOMY/DECOMPRESSION MICRODISCECTOMY Bilateral 12/14/2012   Procedure: Bilateral lumbar three-four, four-five decompressive laminotomy/foraminotomy;  Surgeon: Victory DELENA Gunnels, MD;  Location: [facility];  Service: Neurosurgery;  Laterality: Bilateral;   PULSE GENERATOR IMPLANT Bilateral 12/13/2013   Procedure: Bilateral implantable pulse generator placement;  Surgeon: Fairy Levels, MD;  Location: [facility];  Service: Neurosurgery;  Laterality: Bilateral;  Bilateral implantable pulse generator placement   skin cancer removed     from ears,   12 lft arm  rt leg 15   SUBTHALAMIC STIMULATOR BATTERY REPLACEMENT Bilateral 07/14/2017   Procedure: BILATERAL IMPLANTED PULSE GENERATOR CHANGE FOR DEEP BRAIN STIMULATOR;  Surgeon: Levels Fairy, MD;  Location: [facility];  Service: Neurosurgery;  Laterality: Bilateral;   SUBTHALAMIC STIMULATOR INSERTION Bilateral  12/06/2013   Procedure: SUBTHALAMIC STIMULATOR INSERTION;  Surgeon: Fairy Levels, MD;  Location: [facility];  Service: Neurosurgery;  Laterality: Bilateral;  Bilateral deep brain stimulator placement   Patient Active Problem List   Diagnosis Date Noted   Hematuria 10/10/2021   Sinus drainage 12/09/2020   Medicare annual wellness visit, subsequent 11/18/2020   Pacemaker    Dementia associated with Parkinson's disease (HCC) 06/17/2020   Vertigo 02/19/2020   Urine abnormality 02/19/2020   Dysphagia  02/23/2019   Fall at home 10/18/2017   REM behavioral disorder 11/02/2016   Radicular pain in right arm 10/09/2016   GERD (gastroesophageal reflux disease) 07/31/2016   Colon cancer screening 07/23/2015   Gout 02/18/2015   Depression 01/06/2015   Skin lesion 10/02/2014   Dupuytren's contracture 08/20/2014   Cough 07/21/2014   Lumbar stenosis with neurogenic claudication 06/13/2014   S/P deep brain stimulator placement 05/08/2014   Advance care planning 01/19/2014   Parkinson's disease (HCC) 12/13/2013   PTSD (post-traumatic stress disorder) 06/13/2013   Erectile dysfunction 06/13/2013   HLD (hyperlipidemia) 06/13/2013   Essential hypertension 06/03/2013   Bradycardia by electrocardiogram 06/03/2013   Obstructive sleep apnea 03/12/2013    ONSET DATE: September of 2025 (recent hospitalization).   REFERRING DIAG: Parkinson's disease without dyskinesia, with fluctuations   THERAPY DIAG:  Muscle weakness (generalized)  Other lack of coordination  Parkinson's disease without dyskinesia or fluctuating manifestations (HCC)  Unsteadiness on feet  Difficulty in walking, not elsewhere classified  Repeated falls  Other abnormalities of gait and mobility  Abnormality of gait and mobility  Rationale for Evaluation and Treatment: Rehabilitation  SUBJECTIVE:                                                                                                                                                                                             SUBJECTIVE STATEMENT: Caregiver present throughout session. She states that he had a fall  on Christmas eve reaching to hold grandchild. No medical attention required. Will be having Surgery on 04/24/2023 to replace DBS battery. Wife states that they adjusted bedroom set up for love seat at [facility] for dressing based on OT recommendation, which has reduced falls   Will have to keep the foley catheter for the near Future.     Pt accompanied by:  Wife and caretaker.  PERTINENT HISTORY:  Parkinson's, deep brain stim, depression, lipids, HTN, GERD, ED, dementia, seizures/falls, LBP/PSF, hx of lumbar fusion in 04/2022, hx of hip fx 08/2022, arthritis, frequent falls,  and OSA   Patient arrived to PT evaluation with his wife and caretaker which assisted with subjective portion of  evaluation. Pt reports having difficulty with balance/walking following a recent hospitalization. Patient had a UTI in the first week of September resulting in 2 week hospitalization. He then returned home for 2 days and returned back to hospital due to sepsis and was  in hospital an additional 2 weeks. He reports he had kidney stone removed in first week of November as well.   Pt uses a stand up walker for gait. He reports having a fall last week and that he falls more often than he would like. Patient also reports having weakness in his lower extremities which could be contributing to his falls.  Pt has been diagnosed with PD for about 15 years now.  Patient is having battery exchange on brain stimulator 04/23/2024.  Patient has foley catheter since September and is hoping to get it removed next Tuesday. -Patient's wife reports his mobility/independence  is dependent on the day as some days he is more independent than others. She reports days where he will just get up and walk without assistance (unsafely) and other times he has difficulty sitting up straight.   PAIN:  Are you having pain?  Yes, low back and L shoulder due to falls.  PRECAUTIONS: Fall  RED FLAGS: None   WEIGHT BEARING RESTRICTIONS: No  FALLS: Has patient fallen in last 6 months? Yes. Number of falls Unknown (frequent).   LIVING ENVIRONMENT: Lives with: lives with their spouse Lives in: House/apartment Stairs: External: 2-3 steps; bilateral but cannot reach both (due to current home renovations, this is the entrance he uses) primarily uses R HR  Has following equipment at home: Wheelchair  (manual), shower chair, Grab bars, and Up-Walker.  PLOF: Needs assistance with ADLs, Needs assistance with gait, and Needs assistance with transfers  PATIENT GOALS: Improve LE strength/Improve balance.  OBJECTIVE:  Note: Objective measures were completed at Evaluation unless otherwise noted.  DIAGNOSTIC FINDINGS: CLINICAL DATA:  Fall.  Left shoulder pain.   EXAM: LEFT SHOULDER - 2+ VIEW   COMPARISON:  11/21/2019.   FINDINGS: There is a mildly displaced acute fracture of the superolateral aspect of the left clavicle with intra-articular extension.   No other acute fracture or dislocation.   No aggressive osseous lesion.   Glenohumeral and acromioclavicular joints are normal in alignment and exhibit mild degenerative changes.   No soft tissue swelling.   No radiopaque foreign bodies.   IMPRESSION: Acute mildly displaced fracture of the lateral aspect of the left clavicle with intra-articular extension.     Electronically Signed   By: Ree Molt M.D.   On: 05/22/2023 15:27  COGNITION: Overall cognitive status: Cognitive impairments at baseline and pt with very delayed verbal responses to subjective questioning and noted to be easily distracted in the therapy gym environment    SENSATION: WFL  COORDINATION: Decreased coordination with toe tapping/heel to shin.     POSTURE: rounded shoulders, forward head.  LOWER EXTREMITY ROM:     Active  Right Eval Left Eval  Hip flexion    Hip extension    Hip abduction    Hip adduction    Hip internal rotation    Hip external rotation    Knee flexion    Knee extension    Ankle dorsiflexion    Ankle plantarflexion    Ankle inversion    Ankle eversion     (Blank rows = not tested)  LOWER EXTREMITY MMT:    MMT Right Eval Left Eval  Hip flexion 4 4  Hip extension  Hip abduction 4- 4  Hip adduction 5 5  Hip internal rotation    Hip external rotation    Knee flexion 4+ 4+  Knee extension 4+ 4+  Ankle  dorsiflexion 5 5  Ankle plantarflexion 5 5  Ankle inversion    Ankle eversion    (Blank rows = not tested)  BED MOBILITY:  SBA TRANSFERS: SBA  RAMP:  Not tested  CURB:  Not tested  STAIRS: Not tested GAIT: Findings: Distance walked: 3' CGA/MIN. A. Using upright walker and Comments: Scissoring gait with decreased step length.   FUNCTIONAL TESTS:  5 times sit to stand: 15.45'' Timed up and go (TUG): 28.53 10 meter walk test: 12/22: 0.71 m/s with upright walker Berg Balance Scale: 12/22: 11                                                                                                                               TREATMENT DATE: 04/15/2024   Gait training/TA - To improve gait mechanics and functional movements patterns for everyday tasks  Gait with upright walker, performed x 4 bouts x 80-113ft with 2# AW on last 3 bouts.  Sit<>stand x 10 for 4 bouts following gait training; UE support only used on first rep of each set.   Side stepping at edge of mat table. 2X 6 bil  Forward/reverse gait with upright walker 22ft x 4.   Ambulatory transfer to transport chair with upright walker   Throughout session Pt was noted to look back and reach for chair throughout session except for 1 instance with posterior LOB to chair requiring min-mod assist to control descent.    Throughout session PT instructed pt to increase step width to prevent stepping on feet with ambulation, especially with turns, which was noted to improve with repetitions. CGA for safety for all mobility unless otherwise noted.    PATIENT EDUCATION: Education details:  PT POC, LTGs, findings on gait assessments. Person educated: Patient, Spouse, and Caregiver   Education method: Explanation Education comprehension: verbalized understanding and needs further education  HOME EXERCISE PROGRAM: To be provided at initial treatment sessions.   GOALS: Goals reviewed with patient? Yes  SHORT TERM GOALS: Target  date: 04/18/2024  Patient will be independent with HEP in 2 weeks allowing him to perform his exercises at home with improved safety and proper mechanics.  Baseline: HEP to be provided upon initial visits.  Goal status: INITIAL   LONG TERM GOALS: Target date: 06/27/2024   1.  Patient will increase Berg Balance score by > 6 points to demonstrate decreased fall risk during functional activities. Baseline: To be tested 12/22: 11 Goal status: INITIAL   2.  Patient will reduce timed up and go to <11 seconds to reduce fall risk and demonstrate improved transfer/gait ability. Baseline: 28.53 with standing walker CGA. Goal status: INITIAL  3.  Patient will increase 10 meter walk test to >1.21m/s as to improve gait speed for better  community ambulation and to reduce fall risk. Baseline: To be tested 12/22: 0.71 m/s with upright walker Goal status: INITIAL  4.  Patient will demonstrate ability to perform stairs safely with SBA by discharge allowing him to go into/out of home with decreased difficulty.  Baseline: To be assessed.  Goal status: INITIAL  5. Patient will demonstrate ability to ambulate short distances (50') using least restrictive device with SBA by discharge allowing him to ambulate inside home with reduced risk of falls. BASELINE: CGA/MIN. A.      ASSESSMENT:  CLINICAL IMPRESSION: Pt arrives motivated to participate. Improved gait and transfer mechanics and attention to task noted on this day, resulting in less assist from PT for safety with all mobility including multiple bouts of  gait with upright walker in various planes of movement. Instruction for improved step width, with noted improvement following instruction. Pt was also noted to reach for chair prior to sitting throughout majority of session with only minimal cues. Pt may benefit from skilled PT to improve on deficits listed above for  reduced risk of falls and increased independence with gait/functional mobility.     OBJECTIVE IMPAIRMENTS: Abnormal gait, decreased activity tolerance, decreased balance, decreased cognition, decreased coordination, decreased endurance, decreased knowledge of use of DME, decreased mobility, difficulty walking, decreased strength, decreased safety awareness, improper body mechanics, and postural dysfunction.   ACTIVITY LIMITATIONS: carrying, lifting, bending, sitting, standing, squatting, stairs, transfers, and bed mobility  PARTICIPATION LIMITATIONS: meal prep, cleaning, laundry, medication management, interpersonal relationship, driving, shopping, and community activity  PERSONAL FACTORS: Parkinson's, deep brain stim, depression, lipids, HTN, GERD, ED, dementia, seizures/falls, LBP/PSF, hx of lumbar fusion in 04/2022, hx of hip fx 08/2022, arthritis, frequent falls,  and OSA are also affecting patient's functional outcome.  are also affecting patient's functional outcome.   REHAB POTENTIAL: Good  CLINICAL DECISION MAKING: Evolving/moderate complexity  EVALUATION COMPLEXITY: Moderate  PLAN:  PT FREQUENCY: 1-2x/week  PT DURATION: 12 weeks  PLANNED INTERVENTIONS: 97164- PT Re-evaluation, 97750- Physical Performance Testing, 97110-Therapeutic exercises, 97530- Therapeutic activity, 97112- Neuromuscular re-education, 97535- Self Care, [id number]- Manual therapy, [phone]- Gait training, Patient/Family education, Balance training, Stair training, Vestibular training, Wheelchair mobility training, Cryotherapy, and Moist heat  PLAN FOR NEXT SESSION:  -Assess Stairs -Improve safety with gait/transfers -Improve balance/LE strength with progressive exercises.     Massie FORBES Dollar, PT 04/15/2024, 3:34 PM [facility]  Outpatient Physical Therapy- [facility] [phone]         "

## 2024-04-17 ENCOUNTER — Ambulatory Visit

## 2024-04-23 ENCOUNTER — Ambulatory Visit: Attending: Physician Assistant

## 2024-04-25 ENCOUNTER — Ambulatory Visit: Admitting: Physical Therapy

## 2024-04-29 ENCOUNTER — Ambulatory Visit

## 2024-05-02 ENCOUNTER — Ambulatory Visit

## 2024-05-02 ENCOUNTER — Ambulatory Visit: Admitting: Occupational Therapy

## 2024-05-07 ENCOUNTER — Ambulatory Visit: Admitting: Physical Therapy

## 2024-05-07 ENCOUNTER — Ambulatory Visit

## 2024-05-07 ENCOUNTER — Ambulatory Visit: Admitting: Psychology

## 2024-05-10 ENCOUNTER — Ambulatory Visit: Admitting: Physical Therapy

## 2024-05-10 ENCOUNTER — Ambulatory Visit

## 2024-05-14 ENCOUNTER — Ambulatory Visit: Admitting: Physical Therapy

## 2024-05-14 ENCOUNTER — Ambulatory Visit

## 2024-05-16 ENCOUNTER — Ambulatory Visit: Admitting: Occupational Therapy

## 2024-05-16 ENCOUNTER — Ambulatory Visit: Admitting: Physical Therapy

## 2024-05-20 ENCOUNTER — Ambulatory Visit: Admitting: Physical Therapy

## 2024-05-20 ENCOUNTER — Ambulatory Visit

## 2024-05-20 ENCOUNTER — Ambulatory Visit: Payer: Self-pay

## 2024-05-22 ENCOUNTER — Ambulatory Visit: Admitting: Physical Therapy

## 2024-05-22 ENCOUNTER — Ambulatory Visit

## 2024-05-22 NOTE — Therapy (Unsigned)
 " OUTPATIENT PHYSICAL THERAPY NEURO TREATMENT   Patient Name: [name] MRN: [id number] DOB:May 07, 1950, 74 y.o., male Today's Date: 05/22/2024   PCP: Sid Sieving, PA-C  REFERRING PROVIDER: Jacumin, Erika Leigh, PA   END OF SESSION:     Past Medical History:  Diagnosis Date   Anxiety disorder    Arthritis    Bradycardia    Cancer (HCC) 2013   skin cancer   Depression    ptsd   Dysrhythmia    chronic slow heart rate   GERD (gastroesophageal reflux disease)    H/O tachycardia-bradycardia syndrome    Headache(784.0)    tension headaches non recent   History of chicken pox    History of kidney stones    passed   Hypertension    treated with HCTZ   Major depressive disorder    Mild aortic regurgitation    Pacemaker    Parkinson's disease (HCC)    dx'ed 15 years ago   PTSD (post-traumatic stress disorder)    S/P deep brain stimulator placement    Shortness of breath dyspnea    Sleep apnea    doesn't use C-pap   Unspecified dementia, moderate, with psychotic disturbance (HCC)    Varicose veins    Past Surgical History:  Procedure Laterality Date   CATARACT EXTRACTION W/PHACO Right 01/17/2023   Procedure: CATARACT EXTRACTION PHACO AND INTRAOCULAR LENS PLACEMENT (IOC) RIGHT 6.33 00:42.6;  Surgeon: Jaye Fallow, MD;  Location: [facility];  Service: Ophthalmology;  Laterality: Right;   CATARACT EXTRACTION W/PHACO Left 02/07/2023   Procedure: CATARACT EXTRACTION PHACO AND INTRAOCULAR LENS PLACEMENT (IOC) LEFT 6.35 00:38.3;  Surgeon: Jaye Fallow, MD;  Location: [facility];  Service: Ophthalmology;  Laterality: Left;   CHOLECYSTECTOMY N/A 10/22/2014   Procedure: LAPAROSCOPIC CHOLECYSTECTOMY WITH INTRAOPERATIVE CHOLANGIOGRAM;  Surgeon: Elgin Laurence III, MD;  Location: [facility];  Service: General;  Laterality: N/A;   cyst removed      from lip as a child   INTRAMEDULLARY (IM) NAIL INTERTROCHANTERIC N/A 02/18/2019   Procedure: INTRAMEDULLARY (IM)  NAIL INTERTROCHANTRIC, RIGHT,;  Surgeon: Marchia Drivers, MD;  Location: [facility];  Service: Orthopedics;  Laterality: N/A;   LUMBAR LAMINECTOMY/DECOMPRESSION MICRODISCECTOMY Bilateral 12/14/2012   Procedure: Bilateral lumbar three-four, four-five decompressive laminotomy/foraminotomy;  Surgeon: Victory DELENA Gunnels, MD;  Location: [facility];  Service: Neurosurgery;  Laterality: Bilateral;   PULSE GENERATOR IMPLANT Bilateral 12/13/2013   Procedure: Bilateral implantable pulse generator placement;  Surgeon: Fairy Levels, MD;  Location: [facility];  Service: Neurosurgery;  Laterality: Bilateral;  Bilateral implantable pulse generator placement   skin cancer removed     from ears,   12 lft arm  rt leg 15   SUBTHALAMIC STIMULATOR BATTERY REPLACEMENT Bilateral 07/14/2017   Procedure: BILATERAL IMPLANTED PULSE GENERATOR CHANGE FOR DEEP BRAIN STIMULATOR;  Surgeon: Levels Fairy, MD;  Location: [facility];  Service: Neurosurgery;  Laterality: Bilateral;   SUBTHALAMIC STIMULATOR INSERTION Bilateral 12/06/2013   Procedure: SUBTHALAMIC STIMULATOR INSERTION;  Surgeon: Fairy Levels, MD;  Location: [facility];  Service: Neurosurgery;  Laterality: Bilateral;  Bilateral deep brain stimulator placement   Patient Active Problem List   Diagnosis Date Noted   Hematuria 10/10/2021   Sinus drainage 12/09/2020   Medicare annual wellness visit, subsequent 11/18/2020   Pacemaker    Dementia associated with Parkinson's disease (HCC) 06/17/2020   Vertigo 02/19/2020   Urine abnormality 02/19/2020   Dysphagia 02/23/2019   Fall at home 10/18/2017   REM behavioral disorder 11/02/2016   Radicular  pain in right arm 10/09/2016   GERD (gastroesophageal reflux disease) 07/31/2016   Colon cancer screening 07/23/2015   Gout 02/18/2015   Depression 01/06/2015   Skin lesion 10/02/2014   Dupuytren's contracture 08/20/2014   Cough 07/21/2014   Lumbar stenosis with neurogenic claudication 06/13/2014   S/P deep brain stimulator  placement 05/08/2014   Advance care planning 01/19/2014   Parkinson's disease (HCC) 12/13/2013   PTSD (post-traumatic stress disorder) 06/13/2013   Erectile dysfunction 06/13/2013   HLD (hyperlipidemia) 06/13/2013   Essential hypertension 06/03/2013   Bradycardia by electrocardiogram 06/03/2013   Obstructive sleep apnea 03/12/2013    ONSET DATE: September of 2025 (recent hospitalization).   REFERRING DIAG: Parkinson's disease without dyskinesia, with fluctuations   THERAPY DIAG:  No diagnosis found.  Rationale for Evaluation and Treatment: Rehabilitation  SUBJECTIVE:                                                                                                                                                                                             SUBJECTIVE STATEMENT: Caregiver present throughout session. She states that he had a fall  on Christmas eve reaching to hold grandchild. No medical attention required. Will be having Surgery on 04/24/2023 to replace DBS battery. Wife states that they adjusted bedroom set up for love seat at [facility] for dressing based on OT recommendation, which has reduced falls   Will have to keep the foley catheter for the near Future.     Pt accompanied by: Wife and caretaker.  PERTINENT HISTORY:  Parkinson's, deep brain stim, depression, lipids, HTN, GERD, ED, dementia, seizures/falls, LBP/PSF, hx of lumbar fusion in 04/2022, hx of hip fx 08/2022, arthritis, frequent falls,  and OSA   Patient arrived to PT evaluation with his wife and caretaker which assisted with subjective portion of evaluation. Pt reports having difficulty with balance/walking following a recent hospitalization. Patient had a UTI in the first week of September resulting in 2 week hospitalization. He then returned home for 2 days and returned back to hospital due to sepsis and was  in hospital an additional 2 weeks. He reports he had kidney stone removed in first week of November as  well.   Pt uses a stand up walker for gait. He reports having a fall last week and that he falls more often than he would like. Patient also reports having weakness in his lower extremities which could be contributing to his falls.  Pt has been diagnosed with PD for about 15 years now.  Patient is having battery exchange on brain stimulator 04/23/2024.  Patient has foley catheter since September and is hoping  to get it removed next Tuesday. -Patient's wife reports his mobility/independence  is dependent on the day as some days he is more independent than others. She reports days where he will just get up and walk without assistance (unsafely) and other times he has difficulty sitting up straight.   PAIN:  Are you having pain?  Yes, low back and L shoulder due to falls.  PRECAUTIONS: Fall  RED FLAGS: None   WEIGHT BEARING RESTRICTIONS: No  FALLS: Has patient fallen in last 6 months? Yes. Number of falls Unknown (frequent).   LIVING ENVIRONMENT: Lives with: lives with their spouse Lives in: House/apartment Stairs: External: 2-3 steps; bilateral but cannot reach both (due to current home renovations, this is the entrance he uses) primarily uses R HR  Has following equipment at home: Wheelchair (manual), shower chair, Grab bars, and Up-Walker.  PLOF: Needs assistance with ADLs, Needs assistance with gait, and Needs assistance with transfers  PATIENT GOALS: Improve LE strength/Improve balance.  OBJECTIVE:  Note: Objective measures were completed at Evaluation unless otherwise noted.  DIAGNOSTIC FINDINGS: CLINICAL DATA:  Fall.  Left shoulder pain.   EXAM: LEFT SHOULDER - 2+ VIEW   COMPARISON:  11/21/2019.   FINDINGS: There is a mildly displaced acute fracture of the superolateral aspect of the left clavicle with intra-articular extension.   No other acute fracture or dislocation.   No aggressive osseous lesion.   Glenohumeral and acromioclavicular joints are normal in  alignment and exhibit mild degenerative changes.   No soft tissue swelling.   No radiopaque foreign bodies.   IMPRESSION: Acute mildly displaced fracture of the lateral aspect of the left clavicle with intra-articular extension.     Electronically Signed   By: Ree Molt M.D.   On: 05/22/2023 15:27  COGNITION: Overall cognitive status: Cognitive impairments at baseline and pt with very delayed verbal responses to subjective questioning and noted to be easily distracted in the therapy gym environment    SENSATION: WFL  COORDINATION: Decreased coordination with toe tapping/heel to shin.     POSTURE: rounded shoulders, forward head.  LOWER EXTREMITY ROM:     Active  Right Eval Left Eval  Hip flexion    Hip extension    Hip abduction    Hip adduction    Hip internal rotation    Hip external rotation    Knee flexion    Knee extension    Ankle dorsiflexion    Ankle plantarflexion    Ankle inversion    Ankle eversion     (Blank rows = not tested)  LOWER EXTREMITY MMT:    MMT Right Eval Left Eval  Hip flexion 4 4  Hip extension    Hip abduction 4- 4  Hip adduction 5 5  Hip internal rotation    Hip external rotation    Knee flexion 4+ 4+  Knee extension 4+ 4+  Ankle dorsiflexion 5 5  Ankle plantarflexion 5 5  Ankle inversion    Ankle eversion    (Blank rows = not tested)  BED MOBILITY:  SBA TRANSFERS: SBA  RAMP:  Not tested  CURB:  Not tested  STAIRS: Not tested GAIT: Findings: Distance walked: 10' CGA/MIN. A. Using upright walker and Comments: Scissoring gait with decreased step length.   FUNCTIONAL TESTS:  5 times sit to stand: 15.45'' Timed up and go (TUG): 28.53 10 meter walk test: 12/22: 0.71 m/s with upright walker Berg Balance Scale: 12/22: 11  TREATMENT DATE: 05/22/24   Gait training/TA - To  improve gait mechanics and functional movements patterns for everyday tasks  Gait with upright walker, performed x 4 bouts x 80-135ft with 2# AW on last 3 bouts.  Sit<>stand x 10 for 4 bouts following gait training; UE support only used on first rep of each set.   Side stepping at edge of mat table. 2X 6 bil  Forward/reverse gait with upright walker 78ft x 4.   Ambulatory transfer to transport chair with upright walker   Throughout session Pt was noted to look back and reach for chair throughout session except for 1 instance with posterior LOB to chair requiring min-mod assist to control descent.    Throughout session PT instructed pt to increase step width to prevent stepping on feet with ambulation, especially with turns, which was noted to improve with repetitions. CGA for safety for all mobility unless otherwise noted.    PATIENT EDUCATION: Education details:  PT POC, LTGs, findings on gait assessments. Person educated: Patient, Spouse, and Caregiver   Education method: Explanation Education comprehension: verbalized understanding and needs further education  HOME EXERCISE PROGRAM: To be provided at initial treatment sessions.   GOALS: Goals reviewed with patient? Yes  SHORT TERM GOALS: Target date: 04/18/2024  Patient will be independent with HEP in 2 weeks allowing him to perform his exercises at home with improved safety and proper mechanics.  Baseline: HEP to be provided upon initial visits.  Goal status: INITIAL   LONG TERM GOALS: Target date: 06/27/2024   1.  Patient will increase Berg Balance score by > 6 points to demonstrate decreased fall risk during functional activities. Baseline: To be tested 12/22: 11 Goal status: INITIAL   2.  Patient will reduce timed up and go to <11 seconds to reduce fall risk and demonstrate improved transfer/gait ability. Baseline: 28.53 with standing walker CGA. Goal status: INITIAL  3.  Patient will increase 10 meter walk test to  >1.8m/s as to improve gait speed for better community ambulation and to reduce fall risk. Baseline: To be tested 12/22: 0.71 m/s with upright walker Goal status: INITIAL  4.  Patient will demonstrate ability to perform stairs safely with SBA by discharge allowing him to go into/out of home with decreased difficulty.  Baseline: To be assessed.  Goal status: INITIAL  5. Patient will demonstrate ability to ambulate short distances (50') using least restrictive device with SBA by discharge allowing him to ambulate inside home with reduced risk of falls. BASELINE: CGA/MIN. A.      ASSESSMENT:  CLINICAL IMPRESSION: Pt arrives motivated to participate. Improved gait and transfer mechanics and attention to task noted on this day, resulting in less assist from PT for safety with all mobility including multiple bouts of  gait with upright walker in various planes of movement. Instruction for improved step width, with noted improvement following instruction. Pt was also noted to reach for chair prior to sitting throughout majority of session with only minimal cues. Pt may benefit from skilled PT to improve on deficits listed above for  reduced risk of falls and increased independence with gait/functional mobility.    OBJECTIVE IMPAIRMENTS: Abnormal gait, decreased activity tolerance, decreased balance, decreased cognition, decreased coordination, decreased endurance, decreased knowledge of use of DME, decreased mobility, difficulty walking, decreased strength, decreased safety awareness, improper body mechanics, and postural dysfunction.   ACTIVITY LIMITATIONS: carrying, lifting, bending, sitting, standing, squatting, stairs, transfers, and bed mobility  PARTICIPATION LIMITATIONS: meal prep, cleaning, laundry, medication  management, interpersonal relationship, driving, shopping, and community activity  PERSONAL FACTORS: Parkinson's, deep brain stim, depression, lipids, HTN, GERD, ED, dementia,  seizures/falls, LBP/PSF, hx of lumbar fusion in 04/2022, hx of hip fx 08/2022, arthritis, frequent falls,  and OSA are also affecting patient's functional outcome.  are also affecting patient's functional outcome.   REHAB POTENTIAL: Good  CLINICAL DECISION MAKING: Evolving/moderate complexity  EVALUATION COMPLEXITY: Moderate  PLAN:  PT FREQUENCY: 1-2x/week  PT DURATION: 12 weeks  PLANNED INTERVENTIONS: 97164- PT Re-evaluation, 97750- Physical Performance Testing, 97110-Therapeutic exercises, 97530- Therapeutic activity, 97112- Neuromuscular re-education, 97535- Self Care, [id number]- Manual therapy, [phone]- Gait training, Patient/Family education, Balance training, Stair training, Vestibular training, Wheelchair mobility training, Cryotherapy, and Moist heat  PLAN FOR NEXT SESSION:  -Assess Stairs -Improve safety with gait/transfers -Improve balance/LE strength with progressive exercises.     Lonni KATHEE Gainer, PT 05/22/2024, 8:29 AM [facility]  Outpatient Physical Therapy- Main Campus [phone]         "

## 2024-05-27 ENCOUNTER — Ambulatory Visit: Admitting: Occupational Therapy

## 2024-05-27 ENCOUNTER — Ambulatory Visit: Admitting: Physical Therapy

## 2024-05-29 ENCOUNTER — Ambulatory Visit: Attending: Physician Assistant | Admitting: Physical Therapy

## 2024-06-03 ENCOUNTER — Ambulatory Visit: Admitting: Physical Therapy

## 2024-06-05 ENCOUNTER — Ambulatory Visit

## 2024-06-05 ENCOUNTER — Ambulatory Visit: Admitting: Physical Therapy

## 2024-06-10 ENCOUNTER — Ambulatory Visit

## 2024-06-10 ENCOUNTER — Ambulatory Visit: Admitting: Physical Therapy

## 2024-06-12 ENCOUNTER — Ambulatory Visit

## 2024-06-12 ENCOUNTER — Ambulatory Visit: Admitting: Physical Therapy

## 2024-06-17 ENCOUNTER — Ambulatory Visit

## 2024-06-17 ENCOUNTER — Ambulatory Visit: Attending: Physician Assistant | Admitting: Physical Therapy

## 2024-06-18 ENCOUNTER — Ambulatory Visit: Admitting: Psychology

## 2024-06-19 ENCOUNTER — Ambulatory Visit

## 2024-06-19 ENCOUNTER — Ambulatory Visit: Admitting: Physical Therapy

## 2024-06-24 ENCOUNTER — Ambulatory Visit: Admitting: Physical Therapy

## 2024-06-24 ENCOUNTER — Ambulatory Visit

## 2024-06-26 ENCOUNTER — Ambulatory Visit: Admitting: Physical Therapy

## 2024-06-26 ENCOUNTER — Ambulatory Visit

## 2024-06-26 ENCOUNTER — Ambulatory Visit: Admitting: Dermatology

## 2024-07-01 ENCOUNTER — Ambulatory Visit

## 2024-07-01 ENCOUNTER — Ambulatory Visit: Admitting: Physical Therapy

## 2024-07-03 ENCOUNTER — Ambulatory Visit: Admitting: Physical Therapy

## 2024-07-03 ENCOUNTER — Ambulatory Visit

## 2024-07-08 ENCOUNTER — Ambulatory Visit

## 2024-07-08 ENCOUNTER — Ambulatory Visit: Admitting: Physical Therapy

## 2024-07-10 ENCOUNTER — Ambulatory Visit

## 2024-08-19 ENCOUNTER — Ambulatory Visit: Payer: Self-pay

## 2024-11-18 ENCOUNTER — Ambulatory Visit: Payer: Self-pay

## 2025-02-17 ENCOUNTER — Ambulatory Visit: Payer: Self-pay

## 2025-05-19 ENCOUNTER — Ambulatory Visit: Payer: Self-pay
